# Patient Record
Sex: Male | Born: 1960 | Hispanic: No | Marital: Married | Smoking: Former smoker
Health system: Southern US, Community
[De-identification: ages and names within clinical notes are randomized; demographics above are authoritative.]

## PROBLEM LIST (undated history)

## (undated) ENCOUNTER — Emergency Department (HOSPITAL_COMMUNITY): Admission: EM | Payer: Medicare Other

## (undated) DIAGNOSIS — T7840XA Allergy, unspecified, initial encounter: Secondary | ICD-10-CM

## (undated) DIAGNOSIS — I499 Cardiac arrhythmia, unspecified: Secondary | ICD-10-CM

## (undated) DIAGNOSIS — K5900 Constipation, unspecified: Secondary | ICD-10-CM

## (undated) DIAGNOSIS — M25529 Pain in unspecified elbow: Secondary | ICD-10-CM

## (undated) DIAGNOSIS — K219 Gastro-esophageal reflux disease without esophagitis: Secondary | ICD-10-CM

## (undated) DIAGNOSIS — E785 Hyperlipidemia, unspecified: Secondary | ICD-10-CM

## (undated) DIAGNOSIS — G4730 Sleep apnea, unspecified: Secondary | ICD-10-CM

## (undated) DIAGNOSIS — M545 Low back pain, unspecified: Secondary | ICD-10-CM

## (undated) DIAGNOSIS — G8929 Other chronic pain: Secondary | ICD-10-CM

## (undated) DIAGNOSIS — G5600 Carpal tunnel syndrome, unspecified upper limb: Secondary | ICD-10-CM

## (undated) DIAGNOSIS — I6789 Other cerebrovascular disease: Secondary | ICD-10-CM

## (undated) DIAGNOSIS — L03319 Cellulitis of trunk, unspecified: Secondary | ICD-10-CM

## (undated) DIAGNOSIS — R748 Abnormal levels of other serum enzymes: Secondary | ICD-10-CM

## (undated) DIAGNOSIS — R51 Headache: Secondary | ICD-10-CM

## (undated) DIAGNOSIS — E559 Vitamin D deficiency, unspecified: Secondary | ICD-10-CM

## (undated) DIAGNOSIS — R5381 Other malaise: Secondary | ICD-10-CM

## (undated) DIAGNOSIS — M25569 Pain in unspecified knee: Secondary | ICD-10-CM

## (undated) DIAGNOSIS — G44209 Tension-type headache, unspecified, not intractable: Secondary | ICD-10-CM

## (undated) DIAGNOSIS — R609 Edema, unspecified: Secondary | ICD-10-CM

## (undated) DIAGNOSIS — N400 Enlarged prostate without lower urinary tract symptoms: Secondary | ICD-10-CM

## (undated) DIAGNOSIS — G4700 Insomnia, unspecified: Secondary | ICD-10-CM

## (undated) DIAGNOSIS — J189 Pneumonia, unspecified organism: Secondary | ICD-10-CM

## (undated) DIAGNOSIS — J810 Acute pulmonary edema: Secondary | ICD-10-CM

## (undated) DIAGNOSIS — Z9989 Dependence on other enabling machines and devices: Secondary | ICD-10-CM

## (undated) DIAGNOSIS — R413 Other amnesia: Secondary | ICD-10-CM

## (undated) DIAGNOSIS — D649 Anemia, unspecified: Secondary | ICD-10-CM

## (undated) DIAGNOSIS — I10 Essential (primary) hypertension: Secondary | ICD-10-CM

## (undated) DIAGNOSIS — G4733 Obstructive sleep apnea (adult) (pediatric): Secondary | ICD-10-CM

## (undated) DIAGNOSIS — R519 Headache, unspecified: Secondary | ICD-10-CM

## (undated) DIAGNOSIS — J45909 Unspecified asthma, uncomplicated: Secondary | ICD-10-CM

## (undated) DIAGNOSIS — M109 Gout, unspecified: Secondary | ICD-10-CM

## (undated) DIAGNOSIS — R5383 Other fatigue: Secondary | ICD-10-CM

## (undated) DIAGNOSIS — I959 Hypotension, unspecified: Secondary | ICD-10-CM

## (undated) DIAGNOSIS — L02219 Cutaneous abscess of trunk, unspecified: Secondary | ICD-10-CM

## (undated) DIAGNOSIS — F419 Anxiety disorder, unspecified: Secondary | ICD-10-CM

## (undated) DIAGNOSIS — N2581 Secondary hyperparathyroidism of renal origin: Secondary | ICD-10-CM

## (undated) DIAGNOSIS — B354 Tinea corporis: Secondary | ICD-10-CM

## (undated) DIAGNOSIS — D631 Anemia in chronic kidney disease: Secondary | ICD-10-CM

## (undated) DIAGNOSIS — N186 End stage renal disease: Secondary | ICD-10-CM

## (undated) DIAGNOSIS — K7689 Other specified diseases of liver: Secondary | ICD-10-CM

## (undated) DIAGNOSIS — Z9889 Other specified postprocedural states: Secondary | ICD-10-CM

## (undated) DIAGNOSIS — Z992 Dependence on renal dialysis: Secondary | ICD-10-CM

## (undated) DIAGNOSIS — N529 Male erectile dysfunction, unspecified: Secondary | ICD-10-CM

## (undated) DIAGNOSIS — Z940 Kidney transplant status: Secondary | ICD-10-CM

## (undated) DIAGNOSIS — R112 Nausea with vomiting, unspecified: Secondary | ICD-10-CM

## (undated) DIAGNOSIS — R0602 Shortness of breath: Secondary | ICD-10-CM

## (undated) DIAGNOSIS — K8020 Calculus of gallbladder without cholecystitis without obstruction: Secondary | ICD-10-CM

## (undated) DIAGNOSIS — G609 Hereditary and idiopathic neuropathy, unspecified: Secondary | ICD-10-CM

## (undated) DIAGNOSIS — N039 Chronic nephritic syndrome with unspecified morphologic changes: Secondary | ICD-10-CM

## (undated) DIAGNOSIS — G40209 Localization-related (focal) (partial) symptomatic epilepsy and epileptic syndromes with complex partial seizures, not intractable, without status epilepticus: Secondary | ICD-10-CM

## (undated) HISTORY — DX: Benign prostatic hyperplasia without lower urinary tract symptoms: N40.0

## (undated) HISTORY — PX: BACK SURGERY: SHX140

## (undated) HISTORY — DX: Dependence on renal dialysis: Z99.2

## (undated) HISTORY — DX: Hyperlipidemia, unspecified: E78.5

## (undated) HISTORY — DX: Cutaneous abscess of trunk, unspecified: L03.319

## (undated) HISTORY — DX: Gastro-esophageal reflux disease without esophagitis: K21.9

## (undated) HISTORY — DX: Edema, unspecified: R60.9

## (undated) HISTORY — DX: Headache: R51

## (undated) HISTORY — DX: Abnormal levels of other serum enzymes: R74.8

## (undated) HISTORY — DX: Tinea corporis: B35.4

## (undated) HISTORY — DX: Other specified diseases of liver: K76.89

## (undated) HISTORY — DX: Anemia, unspecified: D64.9

## (undated) HISTORY — DX: Other chronic pain: G89.29

## (undated) HISTORY — DX: Pain in unspecified knee: M25.569

## (undated) HISTORY — DX: Carpal tunnel syndrome, unspecified upper limb: G56.00

## (undated) HISTORY — DX: Other amnesia: R41.3

## (undated) HISTORY — DX: Essential (primary) hypertension: I10

## (undated) HISTORY — DX: Headache, unspecified: R51.9

## (undated) HISTORY — DX: Allergy, unspecified, initial encounter: T78.40XA

## (undated) HISTORY — DX: Other cerebrovascular disease: I67.89

## (undated) HISTORY — DX: Vitamin D deficiency, unspecified: E55.9

## (undated) HISTORY — DX: Gout, unspecified: M10.9

## (undated) HISTORY — DX: Insomnia, unspecified: G47.00

## (undated) HISTORY — DX: Acute pulmonary edema: J81.0

## (undated) HISTORY — DX: Other malaise: R53.81

## (undated) HISTORY — DX: Hereditary and idiopathic neuropathy, unspecified: G60.9

## (undated) HISTORY — PX: AV FISTULA PLACEMENT: SHX1204

## (undated) HISTORY — DX: Shortness of breath: R06.02

## (undated) HISTORY — DX: Anemia in chronic kidney disease: D63.1

## (undated) HISTORY — DX: Localization-related (focal) (partial) symptomatic epilepsy and epileptic syndromes with complex partial seizures, not intractable, without status epilepticus: G40.209

## (undated) HISTORY — DX: Tension-type headache, unspecified, not intractable: G44.209

## (undated) HISTORY — DX: Pain in unspecified elbow: M25.529

## (undated) HISTORY — DX: Anxiety disorder, unspecified: F41.9

## (undated) HISTORY — DX: Calculus of gallbladder without cholecystitis without obstruction: K80.20

## (undated) HISTORY — DX: Hypotension, unspecified: I95.9

## (undated) HISTORY — DX: Other malaise: R53.83

## (undated) HISTORY — DX: Cutaneous abscess of trunk, unspecified: L02.219

## (undated) HISTORY — DX: Sleep apnea, unspecified: G47.30

## (undated) HISTORY — DX: Constipation, unspecified: K59.00

## (undated) HISTORY — DX: Secondary hyperparathyroidism of renal origin: N25.81

## (undated) HISTORY — DX: Low back pain: M54.5

## (undated) HISTORY — DX: Kidney transplant status: Z94.0

## (undated) HISTORY — DX: Male erectile dysfunction, unspecified: N52.9

## (undated) HISTORY — PX: POLYPECTOMY: SHX149

## (undated) HISTORY — DX: Low back pain, unspecified: M54.50

## (undated) HISTORY — PX: COLONOSCOPY: SHX174

## (undated) HISTORY — DX: Anemia in chronic kidney disease: N03.9

## (undated) SURGERY — Surgical Case
Anesthesia: *Unknown

---
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
## 2001-05-10 ENCOUNTER — Emergency Department (HOSPITAL_COMMUNITY): Admission: EM | Admit: 2001-05-10 | Discharge: 2001-05-10 | Payer: Self-pay | Admitting: Emergency Medicine

## 2001-05-10 ENCOUNTER — Encounter: Payer: Self-pay | Admitting: Emergency Medicine

## 2001-08-24 ENCOUNTER — Emergency Department (HOSPITAL_COMMUNITY): Admission: EM | Admit: 2001-08-24 | Discharge: 2001-08-24 | Payer: Self-pay | Admitting: Emergency Medicine

## 2006-12-13 ENCOUNTER — Inpatient Hospital Stay (HOSPITAL_COMMUNITY): Admission: AD | Admit: 2006-12-13 | Discharge: 2006-12-15 | Payer: Self-pay | Admitting: Cardiology

## 2006-12-17 ENCOUNTER — Inpatient Hospital Stay (HOSPITAL_COMMUNITY): Admission: EM | Admit: 2006-12-17 | Discharge: 2006-12-20 | Payer: Self-pay | Admitting: Emergency Medicine

## 2007-01-13 ENCOUNTER — Ambulatory Visit (HOSPITAL_COMMUNITY): Admission: RE | Admit: 2007-01-13 | Discharge: 2007-01-13 | Payer: Self-pay | Admitting: Nephrology

## 2007-01-20 ENCOUNTER — Encounter (INDEPENDENT_AMBULATORY_CARE_PROVIDER_SITE_OTHER): Payer: Self-pay | Admitting: Nephrology

## 2007-01-20 ENCOUNTER — Ambulatory Visit (HOSPITAL_COMMUNITY): Admission: RE | Admit: 2007-01-20 | Discharge: 2007-01-21 | Payer: Self-pay | Admitting: Nephrology

## 2007-02-14 ENCOUNTER — Encounter: Admission: RE | Admit: 2007-02-14 | Discharge: 2007-02-14 | Payer: Self-pay | Admitting: Nephrology

## 2007-05-06 ENCOUNTER — Encounter (HOSPITAL_COMMUNITY): Admission: RE | Admit: 2007-05-06 | Discharge: 2007-08-04 | Payer: Self-pay | Admitting: Nephrology

## 2007-05-20 ENCOUNTER — Ambulatory Visit: Payer: Self-pay | Admitting: Gastroenterology

## 2007-06-02 ENCOUNTER — Ambulatory Visit: Payer: Self-pay | Admitting: Gastroenterology

## 2007-06-02 ENCOUNTER — Encounter: Payer: Self-pay | Admitting: Gastroenterology

## 2007-06-04 ENCOUNTER — Encounter: Payer: Self-pay | Admitting: Gastroenterology

## 2007-08-26 ENCOUNTER — Encounter (HOSPITAL_COMMUNITY): Admission: RE | Admit: 2007-08-26 | Discharge: 2007-11-24 | Payer: Self-pay | Admitting: Nephrology

## 2007-10-28 ENCOUNTER — Ambulatory Visit: Payer: Self-pay | Admitting: Gastroenterology

## 2007-10-28 DIAGNOSIS — K922 Gastrointestinal hemorrhage, unspecified: Secondary | ICD-10-CM | POA: Insufficient documentation

## 2007-11-04 ENCOUNTER — Ambulatory Visit: Payer: Self-pay | Admitting: Gastroenterology

## 2007-11-04 ENCOUNTER — Encounter: Payer: Self-pay | Admitting: Gastroenterology

## 2007-11-06 ENCOUNTER — Encounter: Payer: Self-pay | Admitting: Gastroenterology

## 2007-11-07 ENCOUNTER — Encounter: Payer: Self-pay | Admitting: Gastroenterology

## 2007-12-02 ENCOUNTER — Encounter (HOSPITAL_COMMUNITY): Admission: RE | Admit: 2007-12-02 | Discharge: 2008-01-22 | Payer: Self-pay | Admitting: Nephrology

## 2008-01-23 HISTORY — PX: VASECTOMY: SHX75

## 2008-02-03 ENCOUNTER — Encounter (HOSPITAL_COMMUNITY): Admission: RE | Admit: 2008-02-03 | Discharge: 2008-05-03 | Payer: Self-pay | Admitting: Nephrology

## 2008-05-11 ENCOUNTER — Encounter (HOSPITAL_COMMUNITY): Admission: RE | Admit: 2008-05-11 | Discharge: 2008-08-09 | Payer: Self-pay | Admitting: Nephrology

## 2008-06-09 ENCOUNTER — Ambulatory Visit: Payer: Self-pay | Admitting: Vascular Surgery

## 2008-06-22 ENCOUNTER — Ambulatory Visit (HOSPITAL_COMMUNITY): Admission: RE | Admit: 2008-06-22 | Discharge: 2008-06-22 | Payer: Self-pay | Admitting: Vascular Surgery

## 2008-06-22 ENCOUNTER — Ambulatory Visit: Payer: Self-pay | Admitting: Vascular Surgery

## 2008-07-28 ENCOUNTER — Ambulatory Visit: Payer: Self-pay | Admitting: Vascular Surgery

## 2008-08-24 ENCOUNTER — Encounter (HOSPITAL_COMMUNITY): Admission: RE | Admit: 2008-08-24 | Discharge: 2008-11-22 | Payer: Self-pay | Admitting: Nephrology

## 2008-11-23 ENCOUNTER — Encounter (HOSPITAL_COMMUNITY): Admission: RE | Admit: 2008-11-23 | Discharge: 2009-02-21 | Payer: Self-pay | Admitting: Nephrology

## 2009-02-22 ENCOUNTER — Encounter (HOSPITAL_COMMUNITY): Admission: RE | Admit: 2009-02-22 | Discharge: 2009-05-23 | Payer: Self-pay | Admitting: Nephrology

## 2009-05-31 ENCOUNTER — Encounter (HOSPITAL_COMMUNITY): Admission: RE | Admit: 2009-05-31 | Discharge: 2009-08-29 | Payer: Self-pay | Admitting: Nephrology

## 2009-08-04 ENCOUNTER — Encounter: Payer: Self-pay | Admitting: Family Medicine

## 2009-08-31 ENCOUNTER — Encounter (HOSPITAL_COMMUNITY): Admission: RE | Admit: 2009-08-31 | Discharge: 2009-11-29 | Payer: Self-pay | Admitting: Nephrology

## 2009-09-12 ENCOUNTER — Ambulatory Visit (HOSPITAL_COMMUNITY): Admission: RE | Admit: 2009-09-12 | Discharge: 2009-09-12 | Payer: Self-pay | Admitting: Internal Medicine

## 2009-12-07 ENCOUNTER — Encounter (HOSPITAL_COMMUNITY)
Admission: RE | Admit: 2009-12-07 | Discharge: 2010-02-21 | Payer: Self-pay | Source: Home / Self Care | Attending: Nephrology | Admitting: Nephrology

## 2010-02-05 DIAGNOSIS — Z992 Dependence on renal dialysis: Secondary | ICD-10-CM

## 2010-02-05 HISTORY — DX: Dependence on renal dialysis: Z99.2

## 2010-02-06 LAB — POCT HEMOGLOBIN-HEMACUE: Hemoglobin: 9.4 g/dL — ABNORMAL LOW (ref 13.0–17.0)

## 2010-02-12 ENCOUNTER — Encounter: Payer: Self-pay | Admitting: Internal Medicine

## 2010-02-18 ENCOUNTER — Emergency Department (HOSPITAL_COMMUNITY)
Admission: EM | Admit: 2010-02-18 | Discharge: 2010-02-18 | Payer: Self-pay | Source: Home / Self Care | Admitting: Emergency Medicine

## 2010-02-18 LAB — BASIC METABOLIC PANEL
BUN: 47 mg/dL — ABNORMAL HIGH (ref 6–23)
CO2: 26 mEq/L (ref 19–32)
Calcium: 9 mg/dL (ref 8.4–10.5)
Chloride: 100 mEq/L (ref 96–112)
Creatinine, Ser: 6.33 mg/dL — ABNORMAL HIGH (ref 0.4–1.5)
GFR calc Af Amer: 11 mL/min — ABNORMAL LOW (ref >60)
GFR calc non Af Amer: 9 mL/min — ABNORMAL LOW (ref >60)
Glucose, Bld: 156 mg/dL — ABNORMAL HIGH (ref 70–99)
Potassium: 4 mEq/L (ref 3.5–5.1)
Sodium: 138 mEq/L (ref 135–145)

## 2010-02-18 LAB — URINALYSIS, ROUTINE W REFLEX MICROSCOPIC
Bilirubin Urine: NEGATIVE
Hgb urine dipstick: NEGATIVE
Ketones, ur: NEGATIVE mg/dL
Leukocytes, UA: NEGATIVE
Nitrite: NEGATIVE
Protein, ur: >300 mg/dL — AB
Specific Gravity, Urine: 1.018 (ref 1.005–1.030)
Urine Glucose, Fasting: NEGATIVE mg/dL
Urobilinogen, UA: 0.2 mg/dL (ref 0.0–1.0)
pH: 5.5 (ref 5.0–8.0)

## 2010-02-18 LAB — CBC
HCT: 36 % — ABNORMAL LOW (ref 39.0–52.0)
Hemoglobin: 11.6 g/dL — ABNORMAL LOW (ref 13.0–17.0)
MCH: 31.8 pg (ref 26.0–34.0)
MCHC: 32.2 g/dL (ref 30.0–36.0)
MCV: 98.6 fL (ref 78.0–100.0)
Platelets: 119 10*3/uL — ABNORMAL LOW (ref 150–400)
RBC: 3.65 MIL/uL — ABNORMAL LOW (ref 4.22–5.81)
RDW: 14.3 % (ref 11.5–15.5)
WBC: 5.7 10*3/uL (ref 4.0–10.5)

## 2010-02-18 LAB — DIFFERENTIAL
Basophils Absolute: 0 10*3/uL (ref 0.0–0.1)
Basophils Relative: 1 % (ref 0–1)
Eosinophils Absolute: 0.1 10*3/uL (ref 0.0–0.7)
Eosinophils Relative: 1 % (ref 0–5)
Lymphocytes Relative: 11 % — ABNORMAL LOW (ref 12–46)
Lymphs Abs: 0.6 10*3/uL — ABNORMAL LOW (ref 0.7–4.0)
Monocytes Absolute: 0.5 10*3/uL (ref 0.1–1.0)
Monocytes Relative: 10 % (ref 3–12)
Neutro Abs: 4.4 10*3/uL (ref 1.7–7.7)
Neutrophils Relative %: 78 % — ABNORMAL HIGH (ref 43–77)

## 2010-02-18 LAB — URINE MICROSCOPIC-ADD ON

## 2010-02-20 LAB — URINE CULTURE
Colony Count: NO GROWTH
Culture  Setup Time: 201201290143
Culture: NO GROWTH

## 2010-03-11 ENCOUNTER — Inpatient Hospital Stay (INDEPENDENT_AMBULATORY_CARE_PROVIDER_SITE_OTHER)
Admission: RE | Admit: 2010-03-11 | Discharge: 2010-03-11 | Disposition: A | Discharge status: Home / Self Care | Payer: BC Managed Care – PPO | Source: Ambulatory Visit | Attending: Emergency Medicine | Admitting: Emergency Medicine

## 2010-03-11 DIAGNOSIS — Z992 Dependence on renal dialysis: Secondary | ICD-10-CM

## 2010-03-11 DIAGNOSIS — N186 End stage renal disease: Secondary | ICD-10-CM

## 2010-03-11 DIAGNOSIS — B9789 Other viral agents as the cause of diseases classified elsewhere: Secondary | ICD-10-CM

## 2010-03-11 LAB — POCT I-STAT, CHEM 8
BUN: 58 mg/dL — ABNORMAL HIGH (ref 6–23)
Calcium, Ion: 1.15 mmol/L (ref 1.12–1.32)
Chloride: 103 mEq/L (ref 96–112)
Creatinine, Ser: 7.7 mg/dL — ABNORMAL HIGH (ref 0.4–1.5)
Glucose, Bld: 97 mg/dL (ref 70–99)
HCT: 38 % — ABNORMAL LOW (ref 39.0–52.0)
Hemoglobin: 12.9 g/dL — ABNORMAL LOW (ref 13.0–17.0)
Potassium: 4.4 mEq/L (ref 3.5–5.1)
Sodium: 137 mEq/L (ref 135–145)
TCO2: 26 mmol/L (ref 0–100)

## 2010-03-11 LAB — POCT URINALYSIS DIPSTICK
Ketones, ur: 15 mg/dL — AB
Nitrite: NEGATIVE
Protein, ur: >=300 mg/dL — AB
Specific Gravity, Urine: 1.025 (ref 1.005–1.030)
Urine Glucose, Fasting: NEGATIVE mg/dL
Urobilinogen, UA: 0.2 mg/dL (ref 0.0–1.0)
pH: 5 (ref 5.0–8.0)

## 2010-04-03 LAB — IRON AND TIBC
Iron: 90 ug/dL (ref 42–135)
Saturation Ratios: 40 % (ref 20–55)
TIBC: 225 ug/dL (ref 215–435)
UIBC: 135 ug/dL

## 2010-04-03 LAB — POCT HEMOGLOBIN-HEMACUE
Hemoglobin: 10.9 g/dL — ABNORMAL LOW (ref 13.0–17.0)
Hemoglobin: 9.3 g/dL — ABNORMAL LOW (ref 13.0–17.0)

## 2010-04-03 LAB — PTH, INTACT AND CALCIUM
Calcium, Total (PTH): 7.9 mg/dL — ABNORMAL LOW (ref 8.4–10.5)
PTH: 93.1 pg/mL — ABNORMAL HIGH (ref 14.0–72.0)

## 2010-04-03 LAB — FERRITIN: Ferritin: 318 ng/mL (ref 22–322)

## 2010-04-04 LAB — POCT HEMOGLOBIN-HEMACUE
Hemoglobin: 9.2 g/dL — ABNORMAL LOW (ref 13.0–17.0)
Hemoglobin: 9.3 g/dL — ABNORMAL LOW (ref 13.0–17.0)

## 2010-04-04 LAB — IRON AND TIBC
Iron: 113 ug/dL (ref 42–135)
TIBC: 235 ug/dL (ref 215–435)
UIBC: 122 ug/dL

## 2010-04-04 LAB — FERRITIN: Ferritin: 470 ng/mL — ABNORMAL HIGH (ref 22–322)

## 2010-04-04 LAB — PTH, INTACT AND CALCIUM: PTH: 69.9 pg/mL (ref 14.0–72.0)

## 2010-04-06 LAB — POCT HEMOGLOBIN-HEMACUE: Hemoglobin: 11.7 g/dL — ABNORMAL LOW (ref 13.0–17.0)

## 2010-04-06 LAB — IRON AND TIBC
Saturation Ratios: 72 % — ABNORMAL HIGH (ref 20–55)
UIBC: 63 ug/dL

## 2010-04-06 LAB — PTH, INTACT AND CALCIUM
Calcium, Total (PTH): 8 mg/dL — ABNORMAL LOW (ref 8.4–10.5)
PTH: 80.5 pg/mL — ABNORMAL HIGH (ref 14.0–72.0)

## 2010-04-07 LAB — FERRITIN: Ferritin: 390 ng/mL — ABNORMAL HIGH (ref 22–322)

## 2010-04-07 LAB — PTH, INTACT AND CALCIUM
Calcium, Total (PTH): 9 mg/dL (ref 8.4–10.5)
PTH: 41.8 pg/mL (ref 14.0–72.0)

## 2010-04-08 LAB — POCT HEMOGLOBIN-HEMACUE: Hemoglobin: 10.9 g/dL — ABNORMAL LOW (ref 13.0–17.0)

## 2010-04-09 LAB — IRON AND TIBC
Iron: 68 ug/dL (ref 42–135)
Saturation Ratios: 41 % (ref 20–55)
UIBC: 139 ug/dL

## 2010-04-09 LAB — DIFFERENTIAL
Basophils Absolute: 0.1 10*3/uL (ref 0.0–0.1)
Lymphocytes Relative: 20 % (ref 12–46)
Lymphs Abs: 1 10*3/uL (ref 0.7–4.0)
Monocytes Absolute: 0.4 10*3/uL (ref 0.1–1.0)
Monocytes Relative: 8 % (ref 3–12)
Neutro Abs: 3.5 10*3/uL (ref 1.7–7.7)

## 2010-04-09 LAB — CBC
Hemoglobin: 8.9 g/dL — ABNORMAL LOW (ref 13.0–17.0)
RBC: 2.69 MIL/uL — ABNORMAL LOW (ref 4.22–5.81)
RDW: 13.4 % (ref 11.5–15.5)
WBC: 5.3 10*3/uL (ref 4.0–10.5)

## 2010-04-09 LAB — PTH, INTACT AND CALCIUM: Calcium, Total (PTH): 8.1 mg/dL — ABNORMAL LOW (ref 8.4–10.5)

## 2010-04-09 LAB — POCT HEMOGLOBIN-HEMACUE
Hemoglobin: 9.7 g/dL — ABNORMAL LOW (ref 13.0–17.0)
Hemoglobin: 8.7 g/dL — ABNORMAL LOW (ref 13.0–17.0)

## 2010-04-10 LAB — IRON AND TIBC
Iron: 140 ug/dL — ABNORMAL HIGH (ref 42–135)
Saturation Ratios: 54 % (ref 20–55)
TIBC: 261 ug/dL (ref 215–435)
UIBC: 121 ug/dL

## 2010-04-10 LAB — POCT HEMOGLOBIN-HEMACUE: Hemoglobin: 10.2 g/dL — ABNORMAL LOW (ref 13.0–17.0)

## 2010-04-10 LAB — PTH, INTACT AND CALCIUM: Calcium, Total (PTH): 10.6 mg/dL — ABNORMAL HIGH (ref 8.4–10.5)

## 2010-04-11 LAB — POCT HEMOGLOBIN-HEMACUE: Hemoglobin: 12.1 g/dL — ABNORMAL LOW (ref 13.0–17.0)

## 2010-04-12 LAB — POCT HEMOGLOBIN-HEMACUE
Hemoglobin: 12.6 g/dL — ABNORMAL LOW (ref 13.0–17.0)
Hemoglobin: 9.3 g/dL — ABNORMAL LOW (ref 13.0–17.0)

## 2010-04-12 LAB — FERRITIN: Ferritin: 248 ng/mL (ref 22–322)

## 2010-04-12 LAB — IRON AND TIBC
Iron: 96 ug/dL (ref 42–135)
TIBC: 252 ug/dL (ref 215–435)
UIBC: 156 ug/dL

## 2010-04-16 LAB — RENAL FUNCTION PANEL
Albumin: 3.7 g/dL (ref 3.5–5.2)
BUN: 103 mg/dL — ABNORMAL HIGH (ref 6–23)
CO2: 21 mEq/L (ref 19–32)
GFR calc non Af Amer: 10 mL/min — ABNORMAL LOW (ref >60)
Glucose, Bld: 103 mg/dL — ABNORMAL HIGH (ref 70–99)
Phosphorus: 6.7 mg/dL — ABNORMAL HIGH (ref 2.3–4.6)
Potassium: 5.5 mEq/L — ABNORMAL HIGH (ref 3.5–5.1)
Sodium: 137 mEq/L (ref 135–145)

## 2010-04-16 LAB — IRON AND TIBC
Saturation Ratios: 36 % (ref 20–55)
TIBC: 253 ug/dL (ref 215–435)
UIBC: 163 ug/dL

## 2010-04-16 LAB — PTH, INTACT AND CALCIUM: PTH: 172.1 pg/mL — ABNORMAL HIGH (ref 14.0–72.0)

## 2010-04-26 LAB — POCT HEMOGLOBIN-HEMACUE
Hemoglobin: 11.8 g/dL — ABNORMAL LOW (ref 13.0–17.0)
Hemoglobin: 12.3 g/dL — ABNORMAL LOW (ref 13.0–17.0)

## 2010-04-26 LAB — PTH, INTACT AND CALCIUM
Calcium, Total (PTH): 8.7 mg/dL (ref 8.4–10.5)
PTH: 31.8 pg/mL (ref 14.0–72.0)

## 2010-04-26 LAB — FERRITIN: Ferritin: 176 ng/mL (ref 22–322)

## 2010-04-26 LAB — IRON AND TIBC: UIBC: 163 ug/dL

## 2010-04-27 LAB — CBC
MCHC: 34.3 g/dL (ref 30.0–36.0)
Platelets: 94 10*3/uL — ABNORMAL LOW (ref 150–400)
RDW: 15.3 % (ref 11.5–15.5)

## 2010-04-27 LAB — IRON AND TIBC
Iron: 31 ug/dL — ABNORMAL LOW (ref 42–135)
UIBC: 193 ug/dL

## 2010-04-27 LAB — POCT HEMOGLOBIN-HEMACUE: Hemoglobin: 10.5 g/dL — ABNORMAL LOW (ref 13.0–17.0)

## 2010-04-27 LAB — FERRITIN: Ferritin: 104 ng/mL (ref 22–322)

## 2010-04-28 LAB — POCT HEMOGLOBIN-HEMACUE: Hemoglobin: 10.5 g/dL — ABNORMAL LOW (ref 13.0–17.0)

## 2010-04-29 LAB — PTH, INTACT AND CALCIUM
Calcium, Total (PTH): 8.1 mg/dL — ABNORMAL LOW (ref 8.4–10.5)
PTH: 89 pg/mL — ABNORMAL HIGH (ref 14.0–72.0)

## 2010-04-29 LAB — IRON AND TIBC
Saturation Ratios: 31 % (ref 20–55)
TIBC: 232 ug/dL (ref 215–435)

## 2010-04-30 LAB — IRON AND TIBC: TIBC: 226 ug/dL (ref 215–435)

## 2010-04-30 LAB — PTH, INTACT AND CALCIUM: Calcium, Total (PTH): 8.1 mg/dL — ABNORMAL LOW (ref 8.4–10.5)

## 2010-05-01 LAB — POCT I-STAT 4, (NA,K, GLUC, HGB,HCT)
Glucose, Bld: 108 mg/dL — ABNORMAL HIGH (ref 70–99)
Potassium: 4.2 mEq/L (ref 3.5–5.1)

## 2010-05-01 LAB — POCT HEMOGLOBIN-HEMACUE: Hemoglobin: 10.4 g/dL — ABNORMAL LOW (ref 13.0–17.0)

## 2010-05-03 LAB — PTH, INTACT AND CALCIUM
Calcium, Total (PTH): 8.3 mg/dL — ABNORMAL LOW (ref 8.4–10.5)
PTH: 85.5 pg/mL — ABNORMAL HIGH (ref 14.0–72.0)

## 2010-05-03 LAB — IRON AND TIBC: UIBC: 153 ug/dL

## 2010-05-03 LAB — POCT HEMOGLOBIN-HEMACUE: Hemoglobin: 11.7 g/dL — ABNORMAL LOW (ref 13.0–17.0)

## 2010-05-03 LAB — FERRITIN: Ferritin: 165 ng/mL (ref 22–322)

## 2010-05-04 LAB — PTH, INTACT AND CALCIUM
Calcium, Total (PTH): 7.8 mg/dL — ABNORMAL LOW (ref 8.4–10.5)
PTH: 113.5 pg/mL — ABNORMAL HIGH (ref 14.0–72.0)

## 2010-05-08 LAB — POCT HEMOGLOBIN-HEMACUE: Hemoglobin: 10.6 g/dL — ABNORMAL LOW (ref 13.0–17.0)

## 2010-05-08 LAB — IRON AND TIBC
Iron: 103 ug/dL (ref 42–135)
TIBC: 268 ug/dL (ref 215–435)
TIBC: 268 ug/dL (ref 215–435)
UIBC: 156 ug/dL

## 2010-05-09 LAB — POCT HEMOGLOBIN-HEMACUE: Hemoglobin: 11.1 g/dL — ABNORMAL LOW (ref 13.0–17.0)

## 2010-06-06 NOTE — Assessment & Plan Note (Signed)
OFFICE VISIT   BRANTLY, FREER B  DOB:  29-May-1960                                       07/28/2008  [id number]   The patient is status post placement of a left radiocephalic fistula on  June 1.  He is currently not on dialysis.   On exam today he has an easily palpable thrill in the fistula.  The  incision is well-healed.  He has no numbness in the hand or forearm.  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  The fistula is maturing nicely and should be ready for use if  he requires dialysis in the future.  He will follow up on an as-needed  basis.   Jessy Oto. Fields, MD  Electronically Signed   CEF/MEDQ  D:  07/28/2008  T:  07/29/2008  Job:  [id number]

## 2010-06-06 NOTE — Discharge Summary (Signed)
NAMEDEKWAN, HANSBERGER NO.:  [id number]   MEDICAL RECORD NO.:  [id number]          PATIENT TYPE:  INP   LOCATION:  2009                         FACILITY:  [facility]   PHYSICIAN:  Tery Sanfilippo, MD     DATE OF BIRTH:  April 17, 1960   DATE OF ADMISSION:  12/17/2006  DATE OF DISCHARGE:  12/20/2006                               DISCHARGE SUMMARY   DISCHARGE DIAGNOSES:  1. Altered mental status, resolved, secondary to clonidine most      likely.  2. 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      a.     Further renal consult as an outpatient.  3. Anemia, has been receiving IV iron and EPO as an outpatient.  4. Secondary hyperparathyroidism.  5. Tobacco abuse, recently on Chantix but have now held.   DISCHARGE CONDITION:  Improved/stable.   DISCHARGE MEDICATIONS:  1. Aspirin 81 mg daily.  2. Norvasc 10 mg daily.  3. Lasix 40 take 2 tablets twice a day.  4. Lisinopril 10 mg 1 twice a day.  5. Calcium acetate.  6. PhosLo 667 mg 3 times a day.  7. STOP Bystolic clonidine, transferrin, HCTZ and Chantix.  WITH      BYSTOLIC THE PATIENT HAS BRADYCARDIA.   ACTIVITY:  As tolerated.   DIET:  2 g sodium diet.  The patient was instructed by nutritionist  prior to discharge.   FOLLOWUP:  1. With Dr. Elisabeth Cara.  He has an appointment on the 4th at 10:30.  2. Follow up with Dr. Justin Mend on December 27, 2006, at 11:30.   DISCHARGE INSTRUCTIONS:  Do not stop.   HISTORY OF PRESENT ILLNESS:  A 50 year old male was seen by Dr. Elisabeth Cara  on December 17, 2006, in the office after being discharge December 13, 2006, for hypertensive crisis.  He had increasing shortness of breath,  headaches, orthopnea, unable to lie flat to sleep.  His blood pressure  in the office at that time was 204/104, was admitted.  His creatinine  was 3.66.  Plans were for outpatient renal workup.  He had renal  Dopplers at [facility], renal arterial Dopplers  performed, and he had  right proximal renal artery 1% to 59% diameter reduction, left renal  artery, normal patency, and the right and left kidneys essentially  normal in size, symmetrical in shape with normal cortex and medulla.  No  abnormal echogenicity or hydronephrosis visualized.  He also has a 2D  echocardiogram with abnormal LV function and diastolic dysfunction but  normal RV function.  He was then admitted, blood pressure was controlled  and he was discharged on a high dose of clonidine 0.3 mg twice a day,  Bystolic 10 daily and Norvasc 10 daily.  He was also on  hydrochlorothiazide at that time as well.  He was reseen as stated on  December 17, 2006, with altered mental status.  He was lethargic, tired,  nauseated, dry mouth and difficulty staying awake.  He was again sent to  [facility] for further evaluation.  Blood pressure was  well  controlled at 132/84.  Plans for renal consult in the hospital.  It was  felt that this was secondary to clonidine intoxication.   Family history, social history, review of systems see H&P.   PHYSICAL EXAMINATION:  At discharge:  VITAL SIGNS:  Blood pressure 124/73, pulse 51, respirations 20, temp  97.2,  oxygen saturation 2 liters 91%.  HEART:  S1 and S2.  Regular rate and rhythm.  LUNGS:  Clear.  ABDOMEN:  Positive bowel sounds.  EXTREMITIES: No edema.   IMAGING:  Renal ultrasound no hydronephrosis.  Kidneys are diffusely  echogenic. This was done, ordered by the renal service.   LABORATORY DATA:  Admitting hemoglobin 10.9, hematocrit 32.5, WBC 6.7,  platelets 205, MCV 91.7 and on day prior to discharge hemoglobin 9.8,  hematocrit 28.6, WBC 8, platelets 164, MCV 91.7.   Chemistry on admission potassium 5.3, sodium 138, chloride 108, CO2 21,  BUN 75, creatinine 3.91, glucose 101, and at discharge sodium 137,  potassium 4.3, chloride 107, CO2 22, BUN 60, creatinine 3.85 and glucose  126.  Coags pro time 12.7, INR 0.9, PTT 27, that was  back on the 21st.  LFTs were normal but his albumin was low initially 2.1 and at day prior  to discharge 1.9.  Cardiac markers on admission CK ranged was 120, MB  1.8, troponin I 0.01, negative.  Electrolytes on the 25th, calcium was  7.3 and at discharge calcium 7.6, phosphorus 7.2.  UA on the 27th  nitrites negative, leukocytes negative, bacteria rare, WBC 0 to 2, RBC  11 to 20, protein greater than 300.  Other laboratory values TSH 2.466.   Total protein 24 hours, urine volume was 4225, total protein 24-hour  urine was 8577, creatinine on the 24-hour urine 49.6, creatinine 24 was  2096.  Hepatitis B surface antigen was negative.  Iron was 37, TIBC 210,  iron sat 18, UIBC 173.  SPE with interpretation pending.  Parathyroid  hormone was pending, and hepatitis C antibody was pending.  Renal  ultrasound as stated.   Renal consult was obtained. Dr. Jimmy Footman saw the patient.  Feels as  stated in the discharge plus he has stage IV chronic renal disease.  The  patient's blood pressure maintained here in the hospital.   HOSPITAL COURSE:  The patient was admitted by Dr. Elisabeth Cara.  Renal  consult was obtained.  Clonidine was discontinued.  The patient was  given Aranesp here in the hospital as well as IV iron due to his low  iron.  He had further renal testing done due to elevated protein.  Dr.  Jimmy Footman recommended decreasing the Bystolic and increasing the ACE  inhibitor which were done prior to discharge.  The patient will continue  on these meds.  In  addition he was bradycardic on his Bystolic with heart rates down to 47  at night.  His only complaint was some mild dizziness when he first  stood up.  The patient was seen both by Dr. Elisabeth Cara and Dr. Jimmy Footman.  He underwent nutritional counseling prior to discharge as well for 2 g  sodium diet.  He will follow up as an outpatient next week.      Otilio Carpen. Ingold, N.P.      Tery Sanfilippo, MD  Electronically Signed    LRI/MEDQ   D:  12/20/2006  T:  12/21/2006  Job:  [id number]   cc:   Justin Mend, MD  Sundra Aland, MD

## 2010-06-06 NOTE — Procedures (Signed)
CEPHALIC VEIN MAPPING   INDICATION:  Preop evaluation.   HISTORY:  Stage 4 kidney disease.   EXAM:   The right cephalic vein is compressible.   Diameter measurements range from 0.3 to 0.49 cm.   The left cephalic vein is compressible.   Diameter measurements range from 0.31 to 0.49 cm.   See attached worksheet for all measurements.   IMPRESSION:  Patent bilateral cephalic veins with diameter measurements  are described above and on the attached work sheet.   ___________________________________________  Jessy Oto. Fields, MD   CH/MEDQ  D:  06/09/2008  T:  06/09/2008  Job:  [id number]

## 2010-06-06 NOTE — Discharge Summary (Signed)
Kirk [name], NABORS NO.:  [id number]   MEDICAL RECORD NO.:  [id number]          PATIENT TYPE:  INP   LOCATION:  [id number]                         FACILITY:  [facility]   PHYSICIAN:  Tery Sanfilippo, MD     DATE OF BIRTH:  October 17, 1960   DATE OF ADMISSION:  12/13/2006  DATE OF DISCHARGE:  12/15/2006                               DISCHARGE SUMMARY   DISCHARGE DIAGNOSES:  1. Hypertensive urgency - resolved.  2. Diastolic congestive heart failure.  3. Renal failure - needs outpatient urology evaluation as soon as      possible.  4. Chronic obstructive pulmonary disease with ongoing smoking on      Chantix.  5. Obesity.   This is a 50 year old gentleman patient of Dr. Elisabeth Cara who came to our  office for evaluation of his shortness of breath.  He has been having  these symptoms for the last six months, but gradually they had gotten  worse and accompanied by headaches and experienced few episodes of  orthopnea during the week prior to his office visit.  In the office, his  blood pressure was elevated to 204/104 and patient was admitted to the  hospital with hypertensive urgency.  On admission, he was started on  Norvasc, Bystolic and Catapres and sent to the hospital for blood  pressure stabilization.   We cycled his cardiac enzymes and there were no abnormalities in them.   Dr. Einar Gip assessed patient the next morning and his blood pressures  still remained elevated although somewhat improved; it was 179/99.  The  dose of Bystolic was increased as well as dose of Catapres.   On the day of discharge, Dr. Einar Gip reassessed the patient.  His blood  pressure was 148/79.  Patient did not have any significant complaints of  chest pain or shortness of breath.  The plan was to discharge him home  on current medications and follow up with Dr. Elisabeth Cara as outpatient.   HOSPITAL LABORATORIES:  Phosphorous 5.6, high.  Magnesium 2.8, high.  Cardiac panel negative x1.  C-Met:  Sodium 136,  potassium 3.9, chloride  110, CO2 18, BUN 72, creatinine 3.79.  Liver function tests:  SGOT 15,  SGPT 21, total protein 2.6, calcium 7.1.  CBC showed white blood cell  count 6.9, hematocrit 28.6, hemoglobin 9.8, platelet count 170,  hemoglobin A1c 5.4. TSH 2.771.   DISCHARGE MEDICATIONS:  1. Chantix 1 mg b.i.d.  2. Norvasc 10 mg q.d.  3. Triamterene/HCTZ 37.5/25 mg q.d.  4. Aspirin 81 mg q.d.  5. Fish oil 1000 mg b.i.d.  6. Clonidine 0.3 mg b.i.d.  7. Bystolic 10 mg q.d.   DISCHARGE FOLLOWUP:  Patient will be seen by Dr. Elisabeth Cara in ten days in  our office.      [name], P.A.      Tery Sanfilippo, MD  Electronically Signed    MK/MEDQ  D:  12/15/2006  T:  12/15/2006  Job:  [id number]   cc:   [place]

## 2010-06-06 NOTE — Assessment & Plan Note (Signed)
OFFICE VISIT   Kirk [name], Kirk [name]  DOB:  [birth year]                                       06/09/2008  [id number]   The patient is a 50 year old male referred by Dr. Jimmy Footman for  placement of long-term hemodialysis access.  Most recent creatinine was  5.16.  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   PAST MEDICAL HISTORY:  Remarkable for hypertension, secondary  hyperparathyroidism, anemia, hyperlipidemia and smoking.   MEDICATIONS:  1. Furosemide 40 mg twice a day.  2. Calcium acetate 660 mg three times a day.  3. Amlodipine once a day.  4. Hectorol 10 mcg one every other day.  5. Nuvigil 250 mg once a day.  6. Sodium bicarbonate 2 tablets twice a day.  7. Lisinopril 80 mg 2 tablets at bedtime.  8. Zocor 80 mg 2 tablets at bedtime.  9. Multivitamin.   ALLERGIES:  He has a side effect to codeine which causes nausea but  states that he can take Percocet.   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  Currently smokes 4-5 cigarettes per day.  He does not  consume alcohol regularly.   REVIEW OF SYSTEMS:  He is 5 feet 7 inches, 204 pounds.  ORTHO:  He has multiple arthritis and joint pain.  ENT:  He has had some decline in his eyesight recently.  Otherwise, hematologic, psychiatric, neurologic, GI, pulmonary, cardiac  review of systems are all negative.   PHYSICAL EXAM:  Blood pressure is 137/81 in the left arm, pulse is 58  and regular.  Extremities:  He has 2+ brachial and radial pulses  bilaterally.  On placement of a tourniquet on the left arm there is a  visible left forearm cephalic vein.   He had a vein mapping ultrasound today which shows the vein diameter in  the forearm on the left side between 0.4 and 0.49 mm in diameter.  On  the right side between 0.33 and 0.57 mm in diameter.  The cephalic vein  in the upper  arm also was of good quality.   I discussed with the patient today different forms of hemodialysis  access and I believe he would be best suited for a left radiocephalic AV  fistula.  We have scheduled this for June 1 as he had some conflict with  this next week.  He also needs to have his Procrit shot and some lab  work done for Dr. Jimmy Footman on that day and will try to arrange all of  this for the same day for him.  Risks, benefits, possible complications  and procedure details were explained to the patient and his wife today.  These were including but not limited to bleeding, infection,  nonmaturation of the fistula, numbness and tingling on the dorsum of the  hand.  He understands and agrees to proceed.  He was also given a note  today for excuse from work from 06/22/2008 through 06/25/2008.   Jessy Oto. Fields, MD  Electronically Signed   CEF/MEDQ  D:  06/09/2008  T:  06/10/2008  Job:  [id number]   cc:   Jeneen Rinks L. Deterding, M.D.

## 2010-06-06 NOTE — Consult Note (Signed)
NAME:  Kirk [name], Kirk [name]               ACCOUNT NO.:  [id number]   MEDICAL RECORD NO.:  [id number]          PATIENT TYPE:  INP   LOCATION:  2009                         FACILITY:  [facility]   PHYSICIAN:  James L. Deterding, M.D.DATE OF BIRTH:  1944-10-02   DATE OF CONSULTATION:  DATE OF DISCHARGE:                                 CONSULTATION   REASON FOR CONSULTATION:  1. Severe hypertension.  2. Chronic kidney disease.   HISTORY OF PRESENT ILLNESS:  This is a 50 year old gentleman with recent  diagnosis of severe high blood pressure.  He presented on December 13, 2006 with hypertensive urgency.  He has not measured his blood pressure  reliably for years.  He was admitted on December 13, 2006, and on  December 15, 2006 his blood pressure was brought down.  Creatinine was  in the high 3s at that time.  Nothing was done  particularly.  He was  set up for outpatient evaluation.  He returned December 17, 2006 with  sleepiness, confusion, thought to be related to clonidine, which he was  on at that time.  He has history of headaches occurring very frequently,  almost daily over the last few days.  He took aspirin for them.  He has  had recurrent ankle edema, PND, 2-pillow orthopnea, dyspnea on exertion.  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   He notes ankle edema, which has been very significant for him.  He has  no history of stones, urinary tract infection, hematuria, or flank pain.  He has had no history of renal injury.   ALLERGIES:  CODEINE.  Question if allergy to CODEINE, was mostly GI  upset, was more an intolerance.   CURRENT MEDICATIONS:  Bystolic, clonidine, amlodipine, and now Maxzide.  He is currently now off clonidine.   PAST MEDICAL HISTORY:  Listed above.  Multiple ear operations.   REVIEW OF SYSTEMS:  HEENT:   No headaches.  He recently started wearing  glasses.  No soreness, dry eyes or dry mouth.  He has around a 60 to 20  pack/year history of smoking, and just quit in the last couple of days.  He coughs up some phlegm at the current time.  GI:  He has some  constipation at the current time, no indigestion or heartburn.  SKIN:  without abnormalities.  MUSCULOSKELETAL:  No history of specific  complaints.  NEUROLOGIC:  Pleasant.  When he came in he was sleepy and  lethargic.  His wife thought that his mentation was fuzzy.  CARDIOVASCULAR:  As listed above.   SOCIAL HISTORY:  He lives with his wife.  He does not drink.  Smoking  history as above.  He was adopted as a child.  Works at [employer].   PHYSICAL EXAMINATION:  GENERAL:  No acute distress.  He is somewhat hard  of hearing.  VITAL SIGNS:  Blood pressure 146/76 supine, going to 144/76 standing.  Temperature is 97.7.  HEENT:  Arterial narrowing, silver wiring, some exudates.  NECK:  Without masses or thyromegaly.  LUNGS:  Reveal normal percussion, normal expansion, normal breath  sounds.  Occasional rhonchi.  CARDIOVASCULAR:  Regular rhythm.  Grade 2/6 holosystolic murmur heard  best at the apex.  PMI is 11 cm at the left intercostal space, left  ventricular lift, hyperventricular, 1+ to 2+ edema.  Pulses 2+/4+.  No  bruits are noted.  ABDOMEN:  Active bowel sounds.  Soft.  The liver is down 2 cm.  No  abnormalities.  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  No thyromegaly.  No significant adenopathy in the  axilla.  No supraclavicular, cervical adenopathy.  NEUROLOGIC:  Cranial nerves II-XII are grossly intact, reactive in all  movements.  Motor 5/5 and symmetric.  Deep tendon reflexes 2+/4+, toes  downgoing.   LABORATORY DATA:  Sodium 134, potassium 4.6, chloride 107, bicarbonate  21, BUN 21, creatinine 3.9, BUN 73, glucose 139, albumin 2.1.  Urinalysis on December 13, 2006:  300 mg protein, 7-10 red cells.   ASSESSMENT:  1. Chronic kidney disease stage 4.  Hypertension clearly has a part,      but questionable if he has a primary glomerulonephritis (GN), most      likely chronic.  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  Needs evaluation.  He is anemic at      this time and is acidemic at this time as complications, as well as      hypertensive.  Will check a PTH also.  2. Hypertension, primary and secondary to his renal disease.  He needs      a loop diuretic-ACE inhibitor to help reserve his renal function as      well as suppress his proteinuria and control his blood pressure.  3. Anemia.  Will check iron studies at this time.  Will need EPO.  4. Secondary hyperparathyroidism.   PLAN:  1. Lasix.  2. Discontinue the IV fluids he is getting now.  3. Ultrasound.  4. UPEP.  5. SPEP.  6. A 24-hour urine.  7. Iron, TIBC.  8. EPO.           ______________________________  Joyice Faster. Deterding, M.D.     JLD/MEDQ  D:  12/18/2006  T:  12/19/2006  Job:  [id number]

## 2010-06-06 NOTE — Assessment & Plan Note (Signed)
[facility] OFFICE NOTE   Kirk [name], Kirk [name]                        MRN:          [id number]  DATE:05/20/2007                            DOB:          10-29-1960    REFERRING PHYSICIAN:  Jeneen Rinks L. Deterding, M.D.   PRIMARY CARE PHYSICIAN:  Robyn Haber, M.D.   REASON FOR REFERRAL:  Dr. Jimmy Footman asked me to evaluate Kirk [name] in  consultation regarding intermittent rectal bleeding.   HISTORY OF PRESENT ILLNESS:  Kirk [name] is a pleasant 50 year old man  with chronic kidney disease due to childhood kidney illness, who had  several months of intermittent bright red blood per rectum.  He says  this happens when he moves his bowels. He has no rectal pain or anal  pain when he moves his bowels.  He is not aware that he has hemorrhoids.  He does have to intermittently strain to move his bowels, although this  is not very common for him.  He was originally set up by Dr. Joseph Art  to have a colonoscopy in 2008 with another physician in [place] but  that had to be cancelled because the patient was hospitalized.   REVIEW OF SYSTEMS:  Notable for stable weight and otherwise essentially  normal.   VITAL SIGNS:  See nursing intake sheet.   PAST MEDICAL HISTORY:  1. Chronic kidney disease, stage IV; Dr. Jimmy Footman follows him      closely.  2. Chronic anemia, on IV iron and Epogen as an outpatient.  3. Secondary hyperparathyroidism.  4. Elevated cholesterol.  5. Chronic headaches.  6. Hypertension.   CURRENT MEDICATIONS:  1. Amlodipine.  2. Calcium.  3. Lasix.  4. Lisinopril.  5. Simvastatin.  6. Vitamin C.  7. Fish oil.  [place].  10.Vitamin B2.  11.Baby aspirin.  12.Garlic tablets.   ALLERGIES:  CODEINE.   SOCIAL HISTORY:  Married, one daughter.  Works as a Aeronautical engineer at [employer].  Smokes 5 to 6 cigarettes a day.  Does not drink alcohol.   FAMILY HISTORY:  He is  adopted.   PHYSICAL EXAMINATION:  VITAL SIGNS:  5 feet, 8 inches, weight 198  pounds.  Blood pressure 102/60, pulse 68.  CONSTITUTIONAL:  Generally well-appearing.  NEUROLOGICAL:  Alert and oriented x3.  HEENT:  Eyes:  Extraocular movements intact.  Mouth: Oropharynx moist,  no lesions.  NECK:  Supple, no lymphadenopathy.  CARDIOVASCULAR:  Heart has regular rate and rhythm.  LUNGS:  Clear to auscultation bilaterally.  ABDOMEN:  Soft, nontender, nondistended, normal bowel sounds.  EXTREMITIES:  No lower extremity edema.  SKIN:  No rashes or lesions on visible extremities.   ASSESSMENT/PLAN:  A 50 year old man with mild intermittent rectal  bleeding in the setting of chronic kidney disease (creatinine usually in  the upper 3's).   This seems anorectal in origin, likely hemorrhoidal.  I will arrange for  him to have a full colonoscopy performed at his soonest convenience.  I  see no reason for any further blood tests or imaging studies prior to  then.  Milus Banister, MD  Electronically Signed    DPJ/MedQ  DD: 05/20/2007  DT: 05/20/2007  Job #: [id number]   cc:   Robyn Haber, M.D.  James L. Deterding, M.D.

## 2010-06-06 NOTE — Op Note (Signed)
NAME:  [name], [name]               ACCOUNT NO.:  [id number]   MEDICAL RECORD NO.:  [id number]          PATIENT TYPE:  AMB   LOCATION:  SDS                          FACILITY:  [facility]   PHYSICIAN:  Charles E. Fields, MD  DATE OF BIRTH:  08-Dec-1960   DATE OF PROCEDURE:  06/22/2008  DATE OF DISCHARGE:                               OPERATIVE REPORT   PROCEDURE:  Left radiocephalic arteriovenous fistula.   PREOPERATIVE DIAGNOSIS:  End-stage renal disease.   POSTOPERATIVE DIAGNOSIS:  End-stage renal disease.   ANESTHESIA:  General.   ASSISTANT:  Schofield Cellar, RNFA   OPERATIVE FINDINGS:  1. A 3-mm left cephalic vein.  2. A 2.5-mm left radial artery with spasm.   OPERATIVE DETAILS:  After obtaining informed consent, the patient was  taken to the operating room.  The patient was placed in supine position  on the operating table.  After induction of general anesthesia, the  patient's entire left upper extremity was prepped and draped in usual  sterile fashion.  Local anesthesia was infiltrated midway between the  cephalic vein and radial artery.  Longitudinal incision made in this  location, carried down through subcutaneous tissues down to level of the  cephalic vein.  Cephalic vein was dissected free circumferentially.  Small side branches were ligated and divided between silk ties.  Radial  artery was then dissected free in the medial portion of the incision.  Radial artery had some spasm and this was approximately 2 to 2.5 mm  artery.  This was dissected free circumferentially and vessel loops  placed proximal and distal to the planned site of arteriotomy.  The  patient was then given 5000 units of intravenous heparin.  Distal  cephalic vein was ligated with 2-0 silk ties.  The vein was then  transected and swung over to the level of the artery.  The vein was  gently distended with heparinized saline and then clamped proximally  with a fine bulldog clamp.  It was marked for  orientation.  Vessel loops  were used to control the radial artery proximally and distally.  A  longitudinal opening was made in the radial artery and the vein was then  sewn end of vein to side of artery using running 7-0 Prolene suture.  Just prior to completion of anastomosis, it was forebled backbled, and  thoroughly flushed.  Anastomosis was secured, clamps were released,  there was  palpable thrill in the proximal fistula immediately.  Next  hemostasis was obtained.  Subcutaneous tissues were reapproximated using  running 3-0 Vicryl suture.  Skin was closed with 4-0 Vicryl subcuticular  stitch.  The patient tolerated the procedure well and there were no  complications.  Instrument, sponge, and needle counts was correct at the  end of the case.  The patient was taken to recovery room in stable  condition.     Jessy Oto. Fields, MD  Electronically Signed    CEF/MEDQ  D:  06/22/2008  T:  06/22/2008  Job:  [id number]

## 2010-06-15 ENCOUNTER — Other Ambulatory Visit: Payer: Self-pay | Admitting: Internal Medicine

## 2010-06-15 DIAGNOSIS — R531 Weakness: Secondary | ICD-10-CM

## 2010-06-15 DIAGNOSIS — G459 Transient cerebral ischemic attack, unspecified: Secondary | ICD-10-CM

## 2010-06-15 DIAGNOSIS — R55 Syncope and collapse: Secondary | ICD-10-CM

## 2010-06-22 ENCOUNTER — Ambulatory Visit
Admission: RE | Admit: 2010-06-22 | Discharge: 2010-06-22 | Disposition: A | Discharge status: Home / Self Care | Payer: BC Managed Care – PPO | Source: Ambulatory Visit | Attending: Internal Medicine | Admitting: Internal Medicine

## 2010-06-22 DIAGNOSIS — R531 Weakness: Secondary | ICD-10-CM

## 2010-06-22 DIAGNOSIS — R55 Syncope and collapse: Secondary | ICD-10-CM

## 2010-06-22 DIAGNOSIS — G459 Transient cerebral ischemic attack, unspecified: Secondary | ICD-10-CM

## 2010-07-20 ENCOUNTER — Encounter: Payer: Self-pay | Admitting: Gastroenterology

## 2010-08-16 ENCOUNTER — Emergency Department (HOSPITAL_COMMUNITY)
Admission: EM | Admit: 2010-08-16 | Discharge: 2010-08-17 | Disposition: A | Discharge status: Home / Self Care | Payer: BC Managed Care – PPO | Attending: Emergency Medicine | Admitting: Emergency Medicine

## 2010-08-16 DIAGNOSIS — J4489 Other specified chronic obstructive pulmonary disease: Secondary | ICD-10-CM | POA: Insufficient documentation

## 2010-08-16 DIAGNOSIS — M25569 Pain in unspecified knee: Secondary | ICD-10-CM | POA: Insufficient documentation

## 2010-08-16 DIAGNOSIS — B20 Human immunodeficiency virus [HIV] disease: Secondary | ICD-10-CM | POA: Insufficient documentation

## 2010-08-16 DIAGNOSIS — J449 Chronic obstructive pulmonary disease, unspecified: Secondary | ICD-10-CM | POA: Insufficient documentation

## 2010-08-16 DIAGNOSIS — E7800 Pure hypercholesterolemia, unspecified: Secondary | ICD-10-CM | POA: Insufficient documentation

## 2010-08-16 DIAGNOSIS — R42 Dizziness and giddiness: Secondary | ICD-10-CM | POA: Insufficient documentation

## 2010-08-16 DIAGNOSIS — H9209 Otalgia, unspecified ear: Secondary | ICD-10-CM | POA: Insufficient documentation

## 2010-08-16 DIAGNOSIS — N186 End stage renal disease: Secondary | ICD-10-CM | POA: Insufficient documentation

## 2010-08-16 DIAGNOSIS — H6120 Impacted cerumen, unspecified ear: Secondary | ICD-10-CM | POA: Insufficient documentation

## 2010-08-16 DIAGNOSIS — I120 Hypertensive chronic kidney disease with stage 5 chronic kidney disease or end stage renal disease: Secondary | ICD-10-CM | POA: Insufficient documentation

## 2010-08-16 DIAGNOSIS — Z992 Dependence on renal dialysis: Secondary | ICD-10-CM | POA: Insufficient documentation

## 2010-08-16 DIAGNOSIS — N2581 Secondary hyperparathyroidism of renal origin: Secondary | ICD-10-CM | POA: Insufficient documentation

## 2010-08-16 DIAGNOSIS — I951 Orthostatic hypotension: Secondary | ICD-10-CM | POA: Insufficient documentation

## 2010-08-16 LAB — CBC
HCT: 33.9 % — ABNORMAL LOW (ref 39.0–52.0)
Hemoglobin: 11.8 g/dL — ABNORMAL LOW (ref 13.0–17.0)
MCH: 32.7 pg (ref 26.0–34.0)
MCHC: 34.8 g/dL (ref 30.0–36.0)
MCV: 93.9 fL (ref 78.0–100.0)
Platelets: 129 10*3/uL — ABNORMAL LOW (ref 150–400)
RBC: 3.61 MIL/uL — ABNORMAL LOW (ref 4.22–5.81)
RDW: 12.4 % (ref 11.5–15.5)
WBC: 7.6 K/uL (ref 4.0–10.5)

## 2010-08-17 LAB — BASIC METABOLIC PANEL
CO2: 30 mEq/L (ref 19–32)
Calcium: 10.7 mg/dL — ABNORMAL HIGH (ref 8.4–10.5)
Creatinine, Ser: 4.82 mg/dL — ABNORMAL HIGH (ref 0.50–1.35)
GFR calc Af Amer: 16 mL/min — ABNORMAL LOW (ref >60)
GFR calc non Af Amer: 13 mL/min — ABNORMAL LOW (ref >60)
Sodium: 136 mEq/L (ref 135–145)

## 2010-08-17 LAB — BASIC METABOLIC PANEL WITH GFR
BUN: 16 mg/dL (ref 6–23)
Chloride: 92 meq/L — ABNORMAL LOW (ref 96–112)
Glucose, Bld: 207 mg/dL — ABNORMAL HIGH (ref 70–99)
Potassium: 3.1 meq/L — ABNORMAL LOW (ref 3.5–5.1)

## 2010-08-17 LAB — TROPONIN I: Troponin I: <0.3 ng/mL (ref <0.30)

## 2010-08-18 LAB — GLUCOSE, CAPILLARY: Glucose-Capillary: 150 mg/dL — ABNORMAL HIGH (ref 70–99)

## 2010-10-11 ENCOUNTER — Other Ambulatory Visit: Payer: Self-pay | Admitting: Internal Medicine

## 2010-10-11 DIAGNOSIS — M25561 Pain in right knee: Secondary | ICD-10-CM

## 2010-10-11 DIAGNOSIS — M25521 Pain in right elbow: Secondary | ICD-10-CM

## 2010-10-16 ENCOUNTER — Ambulatory Visit
Admission: RE | Admit: 2010-10-16 | Discharge: 2010-10-16 | Disposition: A | Discharge status: Home / Self Care | Payer: BC Managed Care – PPO | Source: Ambulatory Visit | Attending: Internal Medicine | Admitting: Internal Medicine

## 2010-10-16 DIAGNOSIS — M25561 Pain in right knee: Secondary | ICD-10-CM

## 2010-10-16 DIAGNOSIS — M25521 Pain in right elbow: Secondary | ICD-10-CM

## 2010-10-18 LAB — CBC
Hemoglobin: 11.9 — ABNORMAL LOW
MCHC: 34.5
Platelets: 121 — ABNORMAL LOW
RDW: 12.8

## 2010-10-18 LAB — IRON AND TIBC
Iron: 44
Saturation Ratios: 16 — ABNORMAL LOW
TIBC: 273

## 2010-10-19 LAB — CBC
HCT: 35.3 — ABNORMAL LOW
MCHC: 33.9
MCV: 92.7
Platelets: 93 — ABNORMAL LOW
RDW: 12.6

## 2010-10-19 LAB — IRON AND TIBC
Iron: 65
UIBC: 186

## 2010-10-20 LAB — POCT I-STAT 4, (NA,K, GLUC, HGB,HCT)
Glucose, Bld: 113 — ABNORMAL HIGH
HCT: 36 — ABNORMAL LOW
Potassium: 4.5

## 2010-10-20 LAB — IRON AND TIBC
Saturation Ratios: 31
TIBC: 251
UIBC: 174

## 2010-10-20 LAB — FERRITIN: Ferritin: 90 (ref 22–322)

## 2010-10-23 LAB — POCT HEMOGLOBIN-HEMACUE
Hemoglobin: 10.9 — ABNORMAL LOW
Hemoglobin: 11.4 — ABNORMAL LOW

## 2010-10-23 LAB — IRON AND TIBC
Iron: 63
UIBC: 165

## 2010-10-24 LAB — IRON AND TIBC: UIBC: 164 ug/dL

## 2010-10-25 LAB — IRON AND TIBC
Iron: 99
Saturation Ratios: 35
UIBC: 180

## 2010-10-26 ENCOUNTER — Other Ambulatory Visit: Payer: Self-pay | Admitting: Internal Medicine

## 2010-10-26 ENCOUNTER — Ambulatory Visit
Admission: RE | Admit: 2010-10-26 | Discharge: 2010-10-26 | Disposition: A | Discharge status: Home / Self Care | Payer: BC Managed Care – PPO | Source: Ambulatory Visit | Attending: Internal Medicine | Admitting: Internal Medicine

## 2010-10-26 DIAGNOSIS — R05 Cough: Secondary | ICD-10-CM

## 2010-10-26 DIAGNOSIS — R509 Fever, unspecified: Secondary | ICD-10-CM

## 2010-10-27 LAB — IRON AND TIBC
Iron: 83 ug/dL (ref 42–135)
Saturation Ratios: 35 % (ref 20–55)
TIBC: 233 ug/dL (ref 215–435)
TIBC: 236 ug/dL (ref 215–435)
UIBC: 153 ug/dL

## 2010-10-27 LAB — CBC
HCT: 29.3 — ABNORMAL LOW
HCT: 32.4 — ABNORMAL LOW
Hemoglobin: 10.1 — ABNORMAL LOW
Hemoglobin: 11.1 — ABNORMAL LOW
MCHC: 34.2
MCHC: 34.4
MCHC: 35
MCV: 89.7
MCV: 91.2
RBC: 3.28 — ABNORMAL LOW
RDW: 12.3
RDW: 12.6
RDW: 12.7

## 2010-10-27 LAB — DIFFERENTIAL
Basophils Absolute: 0.1
Basophils Relative: 1
Eosinophils Relative: 3
Monocytes Absolute: 0.4

## 2010-10-27 LAB — CROSSMATCH: ABO/RH(D): A POS

## 2010-10-27 LAB — ABO/RH: ABO/RH(D): A POS

## 2010-10-27 LAB — BLEEDING TIME
Bleeding Time: 12 — ABNORMAL HIGH
Bleeding Time: 9

## 2010-10-27 LAB — FERRITIN: Ferritin: 154 ng/mL (ref 22–322)

## 2010-10-31 LAB — COMPREHENSIVE METABOLIC PANEL
ALT: 17
AST: 15
AST: 16
AST: 17
Albumin: 1.9 — ABNORMAL LOW
Albumin: 2.1 — ABNORMAL LOW
BUN: 72 — ABNORMAL HIGH
BUN: 75 — ABNORMAL HIGH
CO2: 18 — ABNORMAL LOW
CO2: 22
Calcium: 7.1 — ABNORMAL LOW
Calcium: 7.3 — ABNORMAL LOW
Chloride: 110
Chloride: 112
Creatinine, Ser: 3.79 — ABNORMAL HIGH
Creatinine, Ser: 3.92 — ABNORMAL HIGH
Creatinine, Ser: 3.94 — ABNORMAL HIGH
GFR calc Af Amer: 20 — ABNORMAL LOW
GFR calc Af Amer: 20 — ABNORMAL LOW
GFR calc Af Amer: 21 — ABNORMAL LOW
GFR calc non Af Amer: 17 — ABNORMAL LOW
GFR calc non Af Amer: 17 — ABNORMAL LOW
GFR calc non Af Amer: 17 — ABNORMAL LOW
Potassium: 4.7
Sodium: 139
Total Bilirubin: 0.5
Total Bilirubin: 0.8

## 2010-10-31 LAB — IRON AND TIBC
Iron: 37 — ABNORMAL LOW
UIBC: 173

## 2010-10-31 LAB — DIFFERENTIAL
Basophils Absolute: 0
Basophils Absolute: 0.1
Eosinophils Relative: 1
Lymphocytes Relative: 26
Lymphocytes Relative: 36
Lymphs Abs: 1.8
Lymphs Abs: 2.4
Monocytes Absolute: 0.6
Monocytes Relative: 8
Neutro Abs: 3.6
Neutro Abs: 4.2

## 2010-10-31 LAB — BASIC METABOLIC PANEL
BUN: 73 — ABNORMAL HIGH
CO2: 20
CO2: 22
Calcium: 7.1 — ABNORMAL LOW
Calcium: 7.3 — ABNORMAL LOW
Chloride: 107
Chloride: 107
Chloride: 113 — ABNORMAL HIGH
Creatinine, Ser: 3.7 — ABNORMAL HIGH
Creatinine, Ser: 3.71 — ABNORMAL HIGH
GFR calc Af Amer: 19 — ABNORMAL LOW
GFR calc Af Amer: 22 — ABNORMAL LOW
GFR calc Af Amer: 22 — ABNORMAL LOW
GFR calc non Af Amer: 17 — ABNORMAL LOW
Glucose, Bld: 139 — ABNORMAL HIGH
Glucose, Bld: 98
Potassium: 4.6
Potassium: 5.2 — ABNORMAL HIGH
Sodium: 134 — ABNORMAL LOW
Sodium: 134 — ABNORMAL LOW
Sodium: 137

## 2010-10-31 LAB — LIPID PANEL
Cholesterol: 219 — ABNORMAL HIGH
HDL: 36 — ABNORMAL LOW
LDL Cholesterol: 159 — ABNORMAL HIGH
Total CHOL/HDL Ratio: 6.1
Triglycerides: 121
VLDL: 24

## 2010-10-31 LAB — CBC
HCT: 29.2 — ABNORMAL LOW
HCT: 32.5 — ABNORMAL LOW
Hemoglobin: 10.9 — ABNORMAL LOW
Hemoglobin: 9.7 — ABNORMAL LOW
Hemoglobin: 9.8 — ABNORMAL LOW
MCHC: 33.7
MCHC: 33.8
MCHC: 34
MCHC: 34.2
MCV: 90.8
MCV: 91.4
MCV: 91.7
MCV: 92.1
Platelets: 154
Platelets: 158
Platelets: 164
RBC: 3.12 — ABNORMAL LOW
RBC: 3.12 — ABNORMAL LOW
RBC: 3.16 — ABNORMAL LOW
RBC: 3.17 — ABNORMAL LOW
RBC: 3.54 — ABNORMAL LOW
RDW: 12.8
WBC: 6.9
WBC: 6.9
WBC: 8

## 2010-10-31 LAB — PROTEIN, URINE, 24 HOUR
Protein, 24H Urine: 8577 — ABNORMAL HIGH
Urine Total Volume-UPROT: 4225

## 2010-10-31 LAB — RENAL FUNCTION PANEL
CO2: 22
Calcium: 7.6 — ABNORMAL LOW
GFR calc Af Amer: 21 — ABNORMAL LOW
GFR calc non Af Amer: 17 — ABNORMAL LOW
Phosphorus: 7.2 — ABNORMAL HIGH
Potassium: 4.3
Sodium: 137

## 2010-10-31 LAB — URINALYSIS, ROUTINE W REFLEX MICROSCOPIC
Bilirubin Urine: NEGATIVE
Bilirubin Urine: NEGATIVE
Glucose, UA: NEGATIVE
Glucose, UA: NEGATIVE
Ketones, ur: NEGATIVE
Nitrite: NEGATIVE
Protein, ur: >300 — AB
Specific Gravity, Urine: 1.015
Urobilinogen, UA: 0.2
pH: 5.5

## 2010-10-31 LAB — URINE MICROSCOPIC-ADD ON

## 2010-10-31 LAB — URINE CULTURE
Colony Count: NO GROWTH
Special Requests: NEGATIVE

## 2010-10-31 LAB — CARDIAC PANEL(CRET KIN+CKTOT+MB+TROPI)
Total CK: 144
Troponin I: 0.02

## 2010-10-31 LAB — HEPATITIS C ANTIBODY: HCV Ab: NEGATIVE

## 2010-10-31 LAB — PROTIME-INR: Prothrombin Time: 12.7

## 2010-10-31 LAB — HEPATITIS B SURFACE ANTIGEN: Hepatitis B Surface Ag: NEGATIVE

## 2010-10-31 LAB — MISCELLANEOUS TEST

## 2010-10-31 LAB — HEMOGLOBIN A1C
Hgb A1c MFr Bld: 5.4
Mean Plasma Glucose: 115

## 2010-10-31 LAB — PROTEIN ELECTROPH W RFLX QUANT IMMUNOGLOBULINS
Alpha-1-Globulin: 5.4 — ABNORMAL HIGH
Beta 2: 6.3
Gamma Globulin: 15.1
M-Spike, %: NOT DETECTED

## 2010-10-31 LAB — PTH, INTACT AND CALCIUM
Calcium, Total (PTH): 6.8 — ABNORMAL LOW
PTH: 128.1 — ABNORMAL HIGH

## 2010-10-31 LAB — CK TOTAL AND CKMB (NOT AT ARMC): CK, MB: 1.8

## 2010-10-31 LAB — C4 COMPLEMENT: Complement C4, Body Fluid: 21

## 2010-10-31 LAB — C3 COMPLEMENT: C3 Complement: 91

## 2010-10-31 LAB — PHOSPHORUS: Phosphorus: 5.6 — ABNORMAL HIGH

## 2010-10-31 LAB — MAGNESIUM: Magnesium: 2.8 — ABNORMAL HIGH

## 2010-10-31 LAB — CREATININE, URINE, 24 HOUR
Collection Interval-UCRE24: 24
Urine Total Volume-UCRE24: 4225

## 2010-10-31 LAB — TSH: TSH: 2.466

## 2010-10-31 LAB — ANA: Anti Nuclear Antibody(ANA): NEGATIVE

## 2011-03-12 ENCOUNTER — Other Ambulatory Visit: Payer: Self-pay | Admitting: Internal Medicine

## 2011-03-13 ENCOUNTER — Encounter (HOSPITAL_COMMUNITY): Payer: Self-pay | Admitting: Respiratory Therapy

## 2011-03-21 ENCOUNTER — Encounter (HOSPITAL_COMMUNITY)
Admission: RE | Disposition: A | Discharge status: Home / Self Care | Payer: Self-pay | Source: Ambulatory Visit | Attending: Internal Medicine

## 2011-03-21 ENCOUNTER — Ambulatory Visit (HOSPITAL_COMMUNITY)
Admission: RE | Admit: 2011-03-21 | Discharge: 2011-03-21 | Disposition: A | Discharge status: Home / Self Care | Payer: BC Managed Care – PPO | Source: Ambulatory Visit | Attending: Internal Medicine | Admitting: Internal Medicine

## 2011-03-21 ENCOUNTER — Encounter (HOSPITAL_COMMUNITY): Payer: Self-pay | Admitting: Internal Medicine

## 2011-03-21 DIAGNOSIS — R079 Chest pain, unspecified: Secondary | ICD-10-CM | POA: Insufficient documentation

## 2011-03-21 DIAGNOSIS — Q898 Other specified congenital malformations: Secondary | ICD-10-CM | POA: Insufficient documentation

## 2011-03-21 DIAGNOSIS — N186 End stage renal disease: Secondary | ICD-10-CM | POA: Insufficient documentation

## 2011-03-21 DIAGNOSIS — Z992 Dependence on renal dialysis: Secondary | ICD-10-CM | POA: Insufficient documentation

## 2011-03-21 HISTORY — PX: CARDIAC CATHETERIZATION: SHX172

## 2011-03-21 HISTORY — PX: LEFT HEART CATHETERIZATION WITH CORONARY ANGIOGRAM: SHX5451

## 2011-03-21 SURGERY — LEFT HEART CATHETERIZATION WITH CORONARY ANGIOGRAM
Anesthesia: LOCAL

## 2011-03-21 MED ORDER — MIDAZOLAM HCL 2 MG/2ML IJ SOLN
INTRAMUSCULAR | Status: AC
  Filled 2011-03-21: qty 2

## 2011-03-21 MED ORDER — NITROGLYCERIN 0.2 MG/ML ON CALL CATH LAB
INTRAVENOUS | Status: AC
  Filled 2011-03-21: qty 1

## 2011-03-21 MED ORDER — SODIUM CHLORIDE 0.9 % IV SOLN: 1.0000 mL/kg/h | INTRAVENOUS | Status: AC

## 2011-03-21 MED ORDER — SODIUM CHLORIDE 0.9 % IJ SOLN: 3.0000 mL | Freq: Two times a day (BID) | INTRAMUSCULAR | Status: DC

## 2011-03-21 MED ORDER — ACETAMINOPHEN 325 MG PO TABS: 650.0000 mg | ORAL_TABLET | ORAL | Status: DC | PRN

## 2011-03-21 MED ORDER — FENTANYL CITRATE 0.05 MG/ML IJ SOLN
INTRAMUSCULAR | Status: AC
  Filled 2011-03-21: qty 2

## 2011-03-21 MED ORDER — SODIUM CHLORIDE 0.9 % IV SOLN
INTRAVENOUS | Status: DC
  Administered 2011-03-21: 75 mL/h via INTRAVENOUS

## 2011-03-21 MED ORDER — HEPARIN (PORCINE) IN NACL 2-0.9 UNIT/ML-% IJ SOLN
INTRAMUSCULAR | Status: AC
  Filled 2011-03-21: qty 2000

## 2011-03-21 MED ORDER — ONDANSETRON HCL 4 MG/2ML IJ SOLN: 4.0000 mg | Freq: Four times a day (QID) | INTRAMUSCULAR | Status: DC | PRN

## 2011-03-21 MED ORDER — LIDOCAINE HCL (PF) 1 % IJ SOLN
INTRAMUSCULAR | Status: AC
  Filled 2011-03-21: qty 30

## 2011-03-21 NOTE — H&P (Signed)
[facility]          INTERVAL PROCEDURE H&P   History and Physical Interval Note:  03/21/2011 10:00 AM  Kirk [name] has presented today for their planned procedure. The various methods of treatment have been discussed with the patient and family. After consideration of risks, benefits and other options for treatment, the patient has consented to the procedure.  The patients' outpatient history has been reviewed, patient examined, and no change in status from most recent office note within the past 30 days. I have reviewed the patients' chart and labs and will proceed as planned. Questions were answered to the patient's satisfaction.   [name], MD, [facility] Attending Cardiologist [facility] [place] [facility] 03/21/2011, 10:00 AM

## 2011-03-21 NOTE — Op Note (Signed)
Kirk [name]     CARDIAC CATHETERIZATION REPORT  Kirk [name]   [id number] 10/08/1960  Performing Cardiologist: [name] Primary Physician: Estill Dooms, MD, MD Primary Cardiologist: Dr. Debara Pickett  Procedures Performed:  Left Heart Catheterization via 5 Fr left common femoral artery access  Left Ventriculography, (RAO/LAO) 15 ml/sec for 30 ml total contrast  Native Coronary Angiography   Indication(s): chest pain  History: 51 y.o. 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 He has been experiencing chest pain during dialysis and had a stress test last year at [facility] which was reportedly negative for ischemia. This was part of a transplant work-up which he is undergoing. He recently has been having chest pain at dialysis. Given his numerous risk factors for CAD and need for a definitive study prior to transplant, he was referred for cardiac catheterization.  Consent: The procedure with Risks/Benefits/Alternatives and Indications was reviewed with the patient and family.  All questions were answered.    Risks / Complications include, but not limited to: Death, MI, CVA/TIA, VF/VT (with defibrillation), Bradycardia (need for temporary pacer placement), contrast induced nephropathy, bleeding / bruising / hematoma / pseudoaneurysm, vascular or coronary injury (with possible emergent CT or Vascular Surgery), adverse medication reactions, infection.    The patient and family voice understanding and agree to proceed.    Risks of procedure as well as the alternatives and risks of each were explained to the (patient/caregiver).  Consent for procedure obtained. Consent for signed by MD and patient with RN witness -- placed on chart.  Procedure: The patient was brought to the [facility] in the fasting state and prepped and draped in the usual sterile fashion for (Right groin) access.  Sterile technique was  used including antiseptics, cap, gloves, gown, hand hygiene, mask and sheet.  Skin prep: Chlorhexidine;  Time Out: Verified patient identification, verified procedure, site/side was marked, verified correct patient position, special equipment/implants available, medications/allergies/relevent history reviewed, required imaging and test results available.  Performed  @DHRADCATH @ @DHGROINCATH @ @DHGRAFTANGIO @  The patient was transported to the cath lab holding area in stable condition.   The patient  was stable before, during and following the procedure.   Patient did tolerate procedure well. There were not complications. EBL: <10 cc  Medications:  Sedation:  2 mg IV Versed, 50 IV mcg Fentanyl  Contrast:  85 cc Omnipaque   Hemodynamics:  Central Aortic Pressure / Mean Aortic Pressure: 150/83  LV Pressure / LV End diastolic Pressure:  21  Left Ventriculography:  EF:  65%  Wall Motion: Normal wall motion, no MR  Coronary Angiographic Data:  No significant obstructive coronary artery disease. Right dominant system. Large caliber vessels.  Impression: 1.  No signfiicant obstructive CAD. 2.  Chest pain may be chest wall cramping at dialysis, related to hypotension, or non-cardiac cause.   Plan: 1. Discharge home today. Follow-up with me in the office.  The case and results was discussed with the patient and family. The case and results was not discussed with the patient's PCP. The case and results was discussed with the patient's Cardiologist.  Time Spend Directly with Patient:  62 minutes  [name], MD, [facility] Attending Cardiologist [facility] [place] [facility] 03/21/2011, 10:46 AM

## 2011-05-08 ENCOUNTER — Encounter: Payer: Self-pay | Admitting: Gastroenterology

## 2011-06-01 ENCOUNTER — Ambulatory Visit (INDEPENDENT_AMBULATORY_CARE_PROVIDER_SITE_OTHER): Payer: BC Managed Care – PPO | Admitting: Gastroenterology

## 2011-06-01 ENCOUNTER — Encounter: Payer: Self-pay | Admitting: Gastroenterology

## 2011-06-01 VITALS — BP 162/64 | HR 88 | Ht 67.5 in | Wt 230.4 lb

## 2011-06-01 DIAGNOSIS — F17200 Nicotine dependence, unspecified, uncomplicated: Secondary | ICD-10-CM

## 2011-06-01 DIAGNOSIS — R112 Nausea with vomiting, unspecified: Secondary | ICD-10-CM

## 2011-06-01 DIAGNOSIS — D369 Benign neoplasm, unspecified site: Secondary | ICD-10-CM | POA: Insufficient documentation

## 2011-06-01 MED ORDER — OMEPRAZOLE 20 MG PO CPDR: 20.0000 mg | DELAYED_RELEASE_CAPSULE | Freq: Every day | ORAL | Status: DC

## 2011-06-01 MED ORDER — MOVIPREP 100 G PO SOLR: 1.0000 | ORAL | Status: DC

## 2011-06-01 NOTE — Patient Instructions (Addendum)
One of your biggest health concerns is your smoking.  This increases your risk for most cancers and serious cardiovascular diseases such as strokes, heart attacks.  You should try your best to stop.  If you need assistance, please contact your PCP or Smoking Cessation Class at [employer] [phone]) or [place] (1-800-QUIT-NOW). You have been given a separate informational sheet regarding your tobacco use, the importance of quitting and local resources to help you quit. You will be set up for a colonoscopy in [place] for personal history of polyps. You will be set up for an upper endoscopy for nausea, vomiting.  If that is not helpful, then abdominal ultrasound. Samples of PPI (antiacid medicine) given, take one pil once a day 20-30 min before breakfast meal.

## 2011-06-01 NOTE — Progress Notes (Signed)
Review of pertinent gastrointestinal problems: 1. Personal history of tubular adenomas, colonoscopy May 2009. Recommended to have repeat colonoscopy at 3 year interval.  A reminder letter was sent in 2012. 2. new anemia prompted EGD October 2009. Mild gastritis, duodenitis H. pylori positive on biopsy.   HPI: This is a   very pleasant 51 year old man whom I last saw 2-3 years ago. He is here for a new complaint with is wife and daughter  Has been nauseas, vomits.  Intermittent up to 2-3 times a weeks.  Not clearly related to HD.  Was related to dose increase of depakote, eventually this was stopped completely 2-3 months ago.    NO pain with the vomiting.  Weight is up over time.  He is not taking anything for nausea. No dysphagia.  No overt bleeding.  Someone told him he may have chronic pancreatitis from the Depakote that he was started on in January, actually the dose was just increased in January and his nausea began shortly afterward. Eventually the Depakote was completely stopped. I do see that Depakote can cause nausea. Also has a potential complication of pancreatitis. He has been told that he might have pancreatitis, chronic from the Depakote which I think would be highly unlikely.  Labs dated January 2013 show hemoglobin 11, platelets 119. Normal liver tests, creatinine 7 has chronic kidney failure.  Does not take nsaids  Very rare pyrosis, on no meds.  Gastric emptying scan August 2011 ordered by his primary care physician was normal  #2 side effect of Pravachol is nausea, vomiting. #2 side effect of tramadol is nausea   Review of systems: Pertinent positive and negative review of systems were noted in the above HPI section. Complete review of systems was performed and was otherwise normal.    Past Medical History  Diagnosis Date  . Anxiety   . Chronic headaches   . Chronic kidney disease (CKD)   . Dialysis care 02/06/2010  . HTN (hypertension)   . Sleep apnea     Past  Surgical History  Procedure Date  . Inner ear surgery     for deafness at age 25    Current Outpatient Prescriptions  Medication Sig Dispense Refill  . amLODipine (NORVASC) 5 MG tablet Take 5 mg by mouth daily.      Marland Kitchen aspirin 81 MG tablet Take 81 mg by mouth daily.      . calcium acetate (PHOSLO) 667 MG capsule Take 2,001 mg by mouth 3 (three) times daily with meals. Take 1 capsule with snacks      . doxazosin (CARDURA) 2 MG tablet Take 2 mg by mouth at bedtime.      . multivitamin (RENA-VIT) TABS tablet Take 1 tablet by mouth daily.      . pravastatin (PRAVACHOL) 20 MG tablet Take 20 mg by mouth daily.      . traMADol (ULTRAM) 50 MG tablet Take 100 mg by mouth every 6 (six) hours as needed. For pain      . vitamin C (ASCORBIC ACID) 500 MG tablet Take 500 mg by mouth daily.      Marland Kitchen zolpidem (AMBIEN) 5 MG tablet Take 5 mg by mouth at bedtime.        Allergies as of 06/01/2011 - Review Complete 06/01/2011  Allergen Reaction Noted  . Codeine  05/20/2007    History reviewed. No pertinent family history.  History   Social History  . Marital Status: Married    Spouse Name: N/A  Number of Children: 3  . Years of Education: N/A   Occupational History  . disabled    Social History Main Topics  . Smoking status: Current Everyday Smoker    Types: Cigarettes  . Smokeless tobacco: Never Used  . Alcohol Use: No  . Drug Use: No  . Sexually Active: Not on file   Other Topics Concern  . Not on file   Social History Narrative  . No narrative on file       Physical Exam: There were no vitals taken for this visit. Constitutional: generally well-appearing Psychiatric: alert and oriented x3 Eyes: extraocular movements intact Mouth: oral pharynx moist, no lesions Neck: supple no lymphadenopathy Cardiovascular: heart regular rate and rhythm Lungs: clear to auscultation bilaterally Abdomen: soft, nontender, nondistended, no obvious ascites, no peritoneal signs, normal bowel  sounds Extremities: no lower extremity edema bilaterally Skin: no lesions on visible extremities    Assessment and plan: 51 y.o. male with nausea, vomiting. History of adenomatous colon polyps, history of H. pylori infection  I think we should proceed with EGD for his nausea, vomiting. He has a history of H. pylori infection and perhaps that his back. He does not take NSAIDs. He is on 2 medicines that can cause nausea as somewhat common side effects that being tramadol and Pravachol. I doubt he has chronic pancreatitis from Depakote. He did receive my reminder letter for a colonoscopy about a year ago and is now willing to undergo surveillance colonoscopy. We'll do that at the same time as his upper endoscopy. Perhaps some of his nausea is related to GERD, acid. I am giving him samples of proton pump inhibitor which she will start once daily.

## 2011-06-27 ENCOUNTER — Encounter: Payer: Self-pay | Admitting: Gastroenterology

## 2011-06-27 ENCOUNTER — Ambulatory Visit (AMBULATORY_SURGERY_CENTER): Payer: BC Managed Care – PPO | Admitting: Gastroenterology

## 2011-06-27 VITALS — BP 156/89 | HR 72 | Temp 99.0°F | Resp 17 | Ht 67.5 in | Wt 230.0 lb

## 2011-06-27 DIAGNOSIS — K2990 Gastroduodenitis, unspecified, without bleeding: Secondary | ICD-10-CM

## 2011-06-27 DIAGNOSIS — D369 Benign neoplasm, unspecified site: Secondary | ICD-10-CM

## 2011-06-27 DIAGNOSIS — D126 Benign neoplasm of colon, unspecified: Secondary | ICD-10-CM

## 2011-06-27 DIAGNOSIS — Z86010 Personal history of colonic polyps: Secondary | ICD-10-CM

## 2011-06-27 DIAGNOSIS — R112 Nausea with vomiting, unspecified: Secondary | ICD-10-CM

## 2011-06-27 DIAGNOSIS — K2970 Gastritis, unspecified, without bleeding: Secondary | ICD-10-CM

## 2011-06-27 MED ORDER — SODIUM CHLORIDE 0.9 % IV SOLN: 500.0000 mL | INTRAVENOUS | Status: DC

## 2011-06-27 NOTE — Progress Notes (Signed)
Pt does dialysis Tuesday, Thursday and Saturday

## 2011-06-27 NOTE — Progress Notes (Signed)
Patient did not experience any of the following events: a burn prior to discharge; a fall within the facility; wrong site/side/patient/procedure/implant event; or a hospital transfer or hospital admission upon discharge from the facility. (G8907) Patient did not have preoperative order for IV antibiotic SSI prophylaxis. (G8918)  

## 2011-06-27 NOTE — Op Note (Signed)
[facility] [address]. [place], [place]  [id number]  COLONOSCOPY PROCEDURE REPORT  PATIENT:  Kirk [name], Kirk [name]  MR#:  [id number] BIRTHDATE:  Apr 28, 1960, 51 yrs. old  GENDER:  male ENDOSCOPIST:  Milus Banister, MD PROCEDURE DATE:  06/27/2011 PROCEDURE:  Colonoscopy with snare polypectomy ASA CLASS:  Class II INDICATIONS:  Personal history of tubular adenomas, colonoscopy May 2009. Recommended to have repeat colonoscopy at 3 year interval.  A reminder letter was sent in 2012. MEDICATIONS:   Fentanyl 75 mcg IV, These medications were titrated to patient response per physician's verbal order, Versed 8 mg IV  DESCRIPTION OF PROCEDURE:   After the risks benefits and alternatives of the procedure were thoroughly explained, informed consent was obtained.  Digital rectal exam was performed and revealed no rectal masses.   The LB CF-[id number] endoscope was introduced through the anus and advanced to the cecum, which was identified by both the appendix and ileocecal valve, without limitations.  The quality of the prep was good..  The instrument was then slowly withdrawn as the colon was fully examined. <<PROCEDUREIMAGES>> FINDINGS:  Two heaped up polyps, 6-25mm across each were removed with snare/cautery from ascending colon. Both were retrieved and sent to pathology (jar 1) (see image4, image5, and image6).  This was otherwise a normal examination of the colon (see image8, image1, and image2).   Retroflexed views in the rectum revealed no abnormalities. COMPLICATIONS:  None  ENDOSCOPIC IMPRESSION: 1) Polyps, multiple 2) Otherwise normal examination  RECOMMENDATIONS: 1) Given your personal history of adenomatous (pre-cancerous) polyps, you will need a repeat colonoscopy in 5 years even if the polyps removed today are NOT precancerous. 2) You will receive a letter within 1-2 weeks with the results of your biopsy as well as final recommendations. Please call my office if  you have not received a letter after 3 weeks.  ______________________________ Milus Banister, MD  n. eSIGNED:   Milus Banister at 06/27/2011 02:40 PM  Elnita Maxwell, [id number]

## 2011-06-27 NOTE — Patient Instructions (Addendum)
One of your biggest health concerns is your smoking.  This increases your risk for most cancers and serious cardiovascular diseases such as strokes, heart attacks.  You should try your best to stop.  If you need assistance, please contact your PCP or Smoking Cessation Class at [facility] [phone]) or [place] (1-800-QUIT-NOW). YOU HAD AN ENDOSCOPIC PROCEDURE TODAY AT [facility]: Refer to the procedure report that was given to you for any specific questions about what was found during the examination.  If the procedure report does not answer your questions, please call your gastroenterologist to clarify.  If you requested that your care partner not be given the details of your procedure findings, then the procedure report has been included in a sealed envelope for you to review at your convenience later.  YOU SHOULD EXPECT: Some feelings of bloating in the abdomen. Passage of more gas than usual.  Walking can help get rid of the air that was put into your GI tract during the procedure and reduce the bloating. If you had a lower endoscopy (such as a colonoscopy or flexible sigmoidoscopy) you may notice spotting of blood in your stool or on the toilet paper. If you underwent a bowel prep for your procedure, then you may not have a normal bowel movement for a few days.  DIET: Your first meal following the procedure should be a light meal and then it is ok to progress to your normal diet.  A half-sandwich or bowl of soup is an example of a good first meal.  Heavy or fried foods are harder to digest and may make you feel nauseous or bloated.  Likewise meals heavy in dairy and vegetables can cause extra gas to form and this can also increase the bloating.  Drink plenty of fluids but you should avoid alcoholic beverages for 24 hours.  ACTIVITY: Your care partner should take you home directly after the procedure.  You should plan to take it easy, moving slowly for the rest of the  day.  You can resume normal activity the day after the procedure however you should NOT DRIVE or use heavy machinery for 24 hours (because of the sedation medicines used during the test).    SYMPTOMS TO REPORT IMMEDIATELY: A gastroenterologist can be reached at any hour.  During normal business hours, 8:30 AM to 5:00 PM Monday through Friday, call [phone].  After hours and on weekends, please call the GI answering service at [phone] who will take a message and have the physician on call contact you.   Following lower endoscopy (colonoscopy or flexible sigmoidoscopy):  Excessive amounts of blood in the stool  Significant tenderness or worsening of abdominal pains  Swelling of the abdomen that is new, acute  Fever of 100F or higher  Following upper endoscopy (EGD)  Vomiting of blood or coffee ground material  New chest pain or pain under the shoulder blades  Painful or persistently difficult swallowing  New shortness of breath  Fever of 100F or higher  Black, tarry-looking stools  FOLLOW UP: If any biopsies were taken you will be contacted by phone or by letter within the next 1-3 weeks.  Call your gastroenterologist if you have not heard about the biopsies in 3 weeks.  Our staff will call the home number listed on your records the next business day following your procedure to check on you and address any questions or concerns that you may have at that time regarding the information  given to you following your procedure. This is a courtesy call and so if there is no answer at the home number and we have not heard from you through the emergency physician on call, we will assume that you have returned to your regular daily activities without incident.  SIGNATURES/CONFIDENTIALITY: You and/or your care partner have signed paperwork which will be entered into your electronic medical record.  These signatures attest to the fact that that the information above on your After Visit  Summary has been reviewed and is understood.  Full responsibility of the confidentiality of this discharge information lies with you and/or your care-partner.   Resume medications.Information given on polyps and gastritis with discharge information.

## 2011-06-27 NOTE — Op Note (Signed)
[facility] [address]. [place], [place]  [id number]  ENDOSCOPY PROCEDURE REPORT  PATIENT:  Kirk [name], Kirk [name]  MR#:  [id number] BIRTHDATE:  [birth year], 51 yrs. old  GENDER:  male ENDOSCOPIST:  Milus Banister, MD PROCEDURE DATE:  06/27/2011 PROCEDURE:  EGD with biopsy, [address] ASA CLASS:  Class II INDICATIONS:  nausea, history of H. pylori + gastritis MEDICATIONS:  Fentanyl 25 mcg IV, There was residual sedation effect present from prior procedure., These medications were titrated to patient response per physician's verbal order, Versed 2 mg IV TOPICAL ANESTHETIC:  Cetacaine Spray  DESCRIPTION OF PROCEDURE:   After the risks benefits and alternatives of the procedure were thoroughly explained, informed consent was obtained.  The LB GIF-H180 [id number] endoscope was introduced through the mouth and advanced to the second portion of the duodenum, without limitations.  The instrument was slowly withdrawn as the mucosa was fully examined. <<PROCEDUREIMAGES>> There was moderate, non-specific gastritis and duodenitis. Biopsies taken from stomach and sent to pathology (jar 1) (see image4 and image6).  Otherwise the examination was normal (see image8, image7, and image2).    Retroflexed views revealed no abnormalities.    The scope was then withdrawn from the patient and the procedure completed. COMPLICATIONS:  None  ENDOSCOPIC IMPRESSION: 1) Moderate gastritis and duodenitis.  Biopsies taken to check for H. pylori. 2) Otherwise normal examination  RECOMMENDATIONS: If biopsies show H. pylori, you will be started on appropriate antibiotics.  ______________________________ Milus Banister, MD  n. eSIGNED:   Milus Banister at 06/27/2011 02:51 PM  Elnita Maxwell, [id number]

## 2011-06-28 ENCOUNTER — Telehealth: Payer: Self-pay | Admitting: *Deleted

## 2011-06-28 NOTE — Telephone Encounter (Signed)
  Follow up Call-  Call back number 06/27/2011  Post procedure Call Back phone  # [phone]  Permission to leave phone message Yes     Patient questions:  Do you have a fever, pain , or abdominal swelling? no Pain Score  0 *  Have you tolerated food without any problems? yes  Have you been able to return to your normal activities? yes  Do you have any questions about your discharge instructions: Diet   no Medications  no Follow up visit  no  Do you have questions or concerns about your Care? no  Actions: * If pain score is 4 or above: No action needed, pain <4.

## 2011-07-03 ENCOUNTER — Encounter: Payer: Self-pay | Admitting: Gastroenterology

## 2011-08-04 ENCOUNTER — Emergency Department (HOSPITAL_COMMUNITY): Payer: BC Managed Care – PPO

## 2011-08-04 ENCOUNTER — Encounter (HOSPITAL_COMMUNITY): Payer: Self-pay | Admitting: Emergency Medicine

## 2011-08-04 ENCOUNTER — Inpatient Hospital Stay (HOSPITAL_COMMUNITY): Payer: BC Managed Care – PPO

## 2011-08-04 ENCOUNTER — Inpatient Hospital Stay (HOSPITAL_COMMUNITY)
Admission: EM | Admit: 2011-08-04 | Discharge: 2011-08-09 | DRG: 087 | Disposition: A | Discharge status: Home / Self Care | Payer: BC Managed Care – PPO | Attending: Emergency Medicine | Admitting: Emergency Medicine

## 2011-08-04 DIAGNOSIS — K922 Gastrointestinal hemorrhage, unspecified: Secondary | ICD-10-CM

## 2011-08-04 DIAGNOSIS — Z992 Dependence on renal dialysis: Secondary | ICD-10-CM

## 2011-08-04 DIAGNOSIS — I120 Hypertensive chronic kidney disease with stage 5 chronic kidney disease or end stage renal disease: Secondary | ICD-10-CM | POA: Diagnosis present

## 2011-08-04 DIAGNOSIS — Z9989 Dependence on other enabling machines and devices: Secondary | ICD-10-CM | POA: Diagnosis present

## 2011-08-04 DIAGNOSIS — J9600 Acute respiratory failure, unspecified whether with hypoxia or hypercapnia: Principal | ICD-10-CM | POA: Diagnosis present

## 2011-08-04 DIAGNOSIS — D369 Benign neoplasm, unspecified site: Secondary | ICD-10-CM

## 2011-08-04 DIAGNOSIS — F411 Generalized anxiety disorder: Secondary | ICD-10-CM | POA: Diagnosis present

## 2011-08-04 DIAGNOSIS — J811 Chronic pulmonary edema: Secondary | ICD-10-CM

## 2011-08-04 DIAGNOSIS — K529 Noninfective gastroenteritis and colitis, unspecified: Secondary | ICD-10-CM | POA: Diagnosis present

## 2011-08-04 DIAGNOSIS — G4733 Obstructive sleep apnea (adult) (pediatric): Secondary | ICD-10-CM | POA: Diagnosis present

## 2011-08-04 DIAGNOSIS — K5289 Other specified noninfective gastroenteritis and colitis: Secondary | ICD-10-CM | POA: Diagnosis present

## 2011-08-04 DIAGNOSIS — Z79899 Other long term (current) drug therapy: Secondary | ICD-10-CM

## 2011-08-04 DIAGNOSIS — J441 Chronic obstructive pulmonary disease with (acute) exacerbation: Secondary | ICD-10-CM | POA: Diagnosis present

## 2011-08-04 DIAGNOSIS — N19 Unspecified kidney failure: Secondary | ICD-10-CM

## 2011-08-04 DIAGNOSIS — F17200 Nicotine dependence, unspecified, uncomplicated: Secondary | ICD-10-CM | POA: Diagnosis present

## 2011-08-04 DIAGNOSIS — D638 Anemia in other chronic diseases classified elsewhere: Secondary | ICD-10-CM | POA: Diagnosis present

## 2011-08-04 DIAGNOSIS — N186 End stage renal disease: Secondary | ICD-10-CM

## 2011-08-04 DIAGNOSIS — J45901 Unspecified asthma with (acute) exacerbation: Secondary | ICD-10-CM | POA: Diagnosis present

## 2011-08-04 DIAGNOSIS — Z7982 Long term (current) use of aspirin: Secondary | ICD-10-CM

## 2011-08-04 DIAGNOSIS — M948X9 Other specified disorders of cartilage, unspecified sites: Secondary | ICD-10-CM | POA: Diagnosis present

## 2011-08-04 DIAGNOSIS — I498 Other specified cardiac arrhythmias: Secondary | ICD-10-CM | POA: Diagnosis present

## 2011-08-04 DIAGNOSIS — J45909 Unspecified asthma, uncomplicated: Secondary | ICD-10-CM | POA: Diagnosis present

## 2011-08-04 LAB — POCT I-STAT TROPONIN I: Troponin i, poc: 0.01 ng/mL (ref 0.00–0.08)

## 2011-08-04 LAB — CBC WITH DIFFERENTIAL/PLATELET
Basophils Relative: 0 % (ref 0–1)
Eosinophils Absolute: 0.1 10*3/uL (ref 0.0–0.7)
Eosinophils Relative: 2 % (ref 0–5)
HCT: 33.3 % — ABNORMAL LOW (ref 39.0–52.0)
Hemoglobin: 11 g/dL — ABNORMAL LOW (ref 13.0–17.0)
Lymphs Abs: 1.6 10*3/uL (ref 0.7–4.0)
MCH: 32.1 pg (ref 26.0–34.0)
MCHC: 33 g/dL (ref 30.0–36.0)
MCV: 97.1 fL (ref 78.0–100.0)
Monocytes Absolute: 0.5 10*3/uL (ref 0.1–1.0)
Neutro Abs: 4.9 10*3/uL (ref 1.7–7.7)
Neutrophils Relative %: 68 % (ref 43–77)
RBC: 3.43 MIL/uL — ABNORMAL LOW (ref 4.22–5.81)

## 2011-08-04 LAB — POCT I-STAT, CHEM 8
BUN: 35 mg/dL — ABNORMAL HIGH (ref 6–23)
Calcium, Ion: 1.01 mmol/L — ABNORMAL LOW (ref 1.12–1.23)
Chloride: 110 mEq/L (ref 96–112)
Creatinine, Ser: 9.5 mg/dL — ABNORMAL HIGH (ref 0.50–1.35)
Glucose, Bld: 118 mg/dL — ABNORMAL HIGH (ref 70–99)
TCO2: 20 mmol/L (ref 0–100)

## 2011-08-04 LAB — PRO B NATRIURETIC PEPTIDE: Pro B Natriuretic peptide (BNP): 725.4 pg/mL — ABNORMAL HIGH (ref 0–125)

## 2011-08-04 LAB — MRSA PCR SCREENING
MRSA by PCR: NEGATIVE
MRSA by PCR: NEGATIVE

## 2011-08-04 MED ORDER — SIMVASTATIN 20 MG PO TABS
20.0000 mg | ORAL_TABLET | Freq: Every day | ORAL | Status: DC
  Administered 2011-08-04 – 2011-08-08 (×5): 20 mg via ORAL
  Filled 2011-08-04 (×6): qty 1

## 2011-08-04 MED ORDER — DOXAZOSIN MESYLATE 2 MG PO TABS
2.0000 mg | ORAL_TABLET | Freq: Two times a day (BID) | ORAL | Status: DC
  Administered 2011-08-04 – 2011-08-06 (×5): 2 mg via ORAL
  Filled 2011-08-04 (×7): qty 1

## 2011-08-04 MED ORDER — SODIUM CHLORIDE 0.9 % IV SOLN: 100.0000 mL | INTRAVENOUS | Status: DC | PRN

## 2011-08-04 MED ORDER — HYDRALAZINE HCL 20 MG/ML IJ SOLN
5.0000 mg | Freq: Once | INTRAMUSCULAR | Status: AC
  Administered 2011-08-04: 5 mg via INTRAVENOUS
  Filled 2011-08-04: qty 0.25

## 2011-08-04 MED ORDER — DOXAZOSIN MESYLATE 2 MG PO TABS: 2.0000 mg | ORAL_TABLET | Freq: Two times a day (BID) | ORAL | Status: DC

## 2011-08-04 MED ORDER — SODIUM CHLORIDE 0.9 % IJ SOLN
3.0000 mL | Freq: Two times a day (BID) | INTRAMUSCULAR | Status: DC
  Administered 2011-08-04 – 2011-08-08 (×9): 3 mL via INTRAVENOUS

## 2011-08-04 MED ORDER — ALBUTEROL SULFATE (5 MG/ML) 0.5% IN NEBU
5.0000 mg | INHALATION_SOLUTION | Freq: Once | RESPIRATORY_TRACT | Status: AC
  Administered 2011-08-04: 5 mg via RESPIRATORY_TRACT
  Filled 2011-08-04: qty 1

## 2011-08-04 MED ORDER — ALBUTEROL SULFATE (5 MG/ML) 0.5% IN NEBU
2.5000 mg | INHALATION_SOLUTION | RESPIRATORY_TRACT | Status: DC | PRN
  Filled 2011-08-04: qty 0.5

## 2011-08-04 MED ORDER — HEPARIN SODIUM (PORCINE) 1000 UNIT/ML DIALYSIS
1000.0000 [IU] | INTRAMUSCULAR | Status: DC | PRN
  Filled 2011-08-04: qty 1

## 2011-08-04 MED ORDER — ACETAMINOPHEN 325 MG PO TABS
650.0000 mg | ORAL_TABLET | Freq: Four times a day (QID) | ORAL | Status: DC | PRN
  Administered 2011-08-04 – 2011-08-07 (×5): 650 mg via ORAL
  Filled 2011-08-04 (×5): qty 2

## 2011-08-04 MED ORDER — ALBUMIN HUMAN 25 % IV SOLN
12.5000 g | Freq: Once | INTRAVENOUS | Status: AC
  Administered 2011-08-04: 12.5 g via INTRAVENOUS

## 2011-08-04 MED ORDER — HYDRALAZINE HCL 20 MG/ML IJ SOLN
10.0000 mg | Freq: Four times a day (QID) | INTRAMUSCULAR | Status: DC | PRN
  Administered 2011-08-04 – 2011-08-05 (×2): 10 mg via INTRAVENOUS
  Filled 2011-08-04: qty 0.5
  Filled 2011-08-04: qty 1

## 2011-08-04 MED ORDER — LIDOCAINE-PRILOCAINE 2.5-2.5 % EX CREA
1.0000 "application " | TOPICAL_CREAM | CUTANEOUS | Status: DC | PRN
  Filled 2011-08-04: qty 5

## 2011-08-04 MED ORDER — LIDOCAINE HCL (PF) 1 % IJ SOLN
5.0000 mL | INTRAMUSCULAR | Status: DC | PRN
  Filled 2011-08-04: qty 5

## 2011-08-04 MED ORDER — METHYLPREDNISOLONE SODIUM SUCC 125 MG IJ SOLR
60.0000 mg | Freq: Four times a day (QID) | INTRAMUSCULAR | Status: DC
  Administered 2011-08-04 – 2011-08-05 (×3): 60 mg via INTRAVENOUS
  Filled 2011-08-04: qty 2
  Filled 2011-08-04: qty 0.96
  Filled 2011-08-04 (×2): qty 2

## 2011-08-04 MED ORDER — NEPRO/CARBSTEADY PO LIQD
237.0000 mL | Freq: Three times a day (TID) | ORAL | Status: DC | PRN
  Filled 2011-08-04: qty 237

## 2011-08-04 MED ORDER — HEPARIN SODIUM (PORCINE) 1000 UNIT/ML DIALYSIS: 40.0000 [IU]/kg | INTRAMUSCULAR | Status: DC | PRN

## 2011-08-04 MED ORDER — IPRATROPIUM BROMIDE 0.02 % IN SOLN
0.5000 mg | Freq: Four times a day (QID) | RESPIRATORY_TRACT | Status: DC
  Administered 2011-08-04 – 2011-08-06 (×8): 0.5 mg via RESPIRATORY_TRACT
  Filled 2011-08-04 (×8): qty 2.5

## 2011-08-04 MED ORDER — FUROSEMIDE 10 MG/ML IJ SOLN
40.0000 mg | Freq: Once | INTRAMUSCULAR | Status: AC
  Administered 2011-08-04: 40 mg via INTRAVENOUS
  Filled 2011-08-04: qty 4

## 2011-08-04 MED ORDER — HYDROXYZINE HCL 25 MG PO TABS: 25.0000 mg | ORAL_TABLET | Freq: Three times a day (TID) | ORAL | Status: DC | PRN

## 2011-08-04 MED ORDER — ONDANSETRON HCL 4 MG/2ML IJ SOLN
4.0000 mg | Freq: Four times a day (QID) | INTRAMUSCULAR | Status: DC | PRN
  Administered 2011-08-04: 4 mg via INTRAVENOUS
  Filled 2011-08-04: qty 2

## 2011-08-04 MED ORDER — ZOLPIDEM TARTRATE 5 MG PO TABS
5.0000 mg | ORAL_TABLET | Freq: Every day | ORAL | Status: DC
  Administered 2011-08-04 – 2011-08-05 (×2): 5 mg via ORAL
  Filled 2011-08-04 (×2): qty 1

## 2011-08-04 MED ORDER — ACETAMINOPHEN 650 MG RE SUPP: 650.0000 mg | Freq: Four times a day (QID) | RECTAL | Status: DC | PRN

## 2011-08-04 MED ORDER — CAMPHOR-MENTHOL 0.5-0.5 % EX LOTN
1.0000 "application " | TOPICAL_LOTION | Freq: Three times a day (TID) | CUTANEOUS | Status: DC | PRN
  Filled 2011-08-04: qty 222

## 2011-08-04 MED ORDER — DOCUSATE SODIUM 283 MG RE ENEM
1.0000 | ENEMA | RECTAL | Status: DC | PRN
  Filled 2011-08-04: qty 1

## 2011-08-04 MED ORDER — ZOLPIDEM TARTRATE 5 MG PO TABS: 5.0000 mg | ORAL_TABLET | Freq: Every evening | ORAL | Status: DC | PRN

## 2011-08-04 MED ORDER — SORBITOL 70 % SOLN
30.0000 mL | Status: DC | PRN
  Filled 2011-08-04: qty 30

## 2011-08-04 MED ORDER — ALTEPLASE 2 MG IJ SOLR
2.0000 mg | Freq: Once | INTRAMUSCULAR | Status: AC | PRN
  Filled 2011-08-04: qty 2

## 2011-08-04 MED ORDER — CALCIUM ACETATE 667 MG PO CAPS
2668.0000 mg | ORAL_CAPSULE | Freq: Three times a day (TID) | ORAL | Status: DC
  Administered 2011-08-04 – 2011-08-06 (×5): 2668 mg via ORAL
  Filled 2011-08-04 (×8): qty 4

## 2011-08-04 MED ORDER — PENTAFLUOROPROP-TETRAFLUOROETH EX AERO
1.0000 "application " | INHALATION_SPRAY | CUTANEOUS | Status: DC | PRN
  Filled 2011-08-04: qty 103.5

## 2011-08-04 MED ORDER — BIOTENE DRY MOUTH MT LIQD
15.0000 mL | Freq: Two times a day (BID) | OROMUCOSAL | Status: DC
  Administered 2011-08-04 – 2011-08-05 (×2): 15 mL via OROMUCOSAL

## 2011-08-04 MED ORDER — ASPIRIN EC 81 MG PO TBEC
81.0000 mg | DELAYED_RELEASE_TABLET | Freq: Every day | ORAL | Status: DC
  Administered 2011-08-04: 81 mg via ORAL

## 2011-08-04 MED ORDER — HEPARIN SODIUM (PORCINE) 5000 UNIT/ML IJ SOLN
5000.0000 [IU] | Freq: Three times a day (TID) | INTRAMUSCULAR | Status: DC
  Administered 2011-08-04 – 2011-08-09 (×15): 5000 [IU] via SUBCUTANEOUS
  Filled 2011-08-04 (×18): qty 1

## 2011-08-04 MED ORDER — ALBUMIN HUMAN 25 % IV SOLN
INTRAVENOUS | Status: AC
  Administered 2011-08-04: 12.5 g via INTRAVENOUS
  Filled 2011-08-04: qty 50

## 2011-08-04 MED ORDER — ASPIRIN EC 81 MG PO TBEC
81.0000 mg | DELAYED_RELEASE_TABLET | Freq: Every day | ORAL | Status: DC
  Administered 2011-08-04 – 2011-08-09 (×6): 81 mg via ORAL
  Filled 2011-08-04 (×6): qty 1

## 2011-08-04 MED ORDER — ALBUTEROL SULFATE (5 MG/ML) 0.5% IN NEBU: 5.0000 mg | INHALATION_SOLUTION | Freq: Once | RESPIRATORY_TRACT | Status: AC

## 2011-08-04 MED ORDER — ASPIRIN 81 MG PO TABS: 81.0000 mg | ORAL_TABLET | Freq: Every day | ORAL | Status: DC

## 2011-08-04 MED ORDER — SODIUM CHLORIDE 0.9 % IV SOLN: 250.0000 mL | INTRAVENOUS | Status: DC | PRN

## 2011-08-04 MED ORDER — NEPRO/CARBSTEADY PO LIQD
237.0000 mL | ORAL | Status: DC | PRN
  Filled 2011-08-04: qty 237

## 2011-08-04 MED ORDER — RENA-VITE PO TABS
1.0000 | ORAL_TABLET | Freq: Every day | ORAL | Status: DC
  Administered 2011-08-04 – 2011-08-08 (×5): 1 via ORAL
  Filled 2011-08-04 (×7): qty 1

## 2011-08-04 MED ORDER — ONDANSETRON HCL 4 MG PO TABS
4.0000 mg | ORAL_TABLET | Freq: Four times a day (QID) | ORAL | Status: DC | PRN
  Administered 2011-08-05: 4 mg via ORAL
  Filled 2011-08-04: qty 1

## 2011-08-04 MED ORDER — CALCIUM CARBONATE 1250 MG/5ML PO SUSP
500.0000 mg | Freq: Four times a day (QID) | ORAL | Status: DC | PRN
  Filled 2011-08-04: qty 5

## 2011-08-04 MED ORDER — ALBUTEROL SULFATE (5 MG/ML) 0.5% IN NEBU
2.5000 mg | INHALATION_SOLUTION | Freq: Four times a day (QID) | RESPIRATORY_TRACT | Status: DC
  Administered 2011-08-04 – 2011-08-06 (×8): 2.5 mg via RESPIRATORY_TRACT
  Filled 2011-08-04 (×7): qty 0.5

## 2011-08-04 MED ORDER — HEPARIN SODIUM (PORCINE) 1000 UNIT/ML DIALYSIS
40.0000 [IU]/kg | INTRAMUSCULAR | Status: DC | PRN
  Administered 2011-08-07: 4200 [IU] via INTRAVENOUS_CENTRAL
  Filled 2011-08-04: qty 5

## 2011-08-04 MED ORDER — VITAMIN C 500 MG PO TABS
500.0000 mg | ORAL_TABLET | Freq: Every day | ORAL | Status: DC
  Administered 2011-08-05 – 2011-08-09 (×5): 500 mg via ORAL
  Filled 2011-08-04 (×6): qty 1

## 2011-08-04 MED ORDER — SODIUM CHLORIDE 0.9 % IJ SOLN: 3.0000 mL | INTRAMUSCULAR | Status: DC | PRN

## 2011-08-04 MED ORDER — RENA-VITE PO TABS: 1.0000 | ORAL_TABLET | Freq: Every day | ORAL | Status: DC

## 2011-08-04 MED ORDER — CALCIUM ACETATE 667 MG PO CAPS
2001.0000 mg | ORAL_CAPSULE | Freq: Three times a day (TID) | ORAL | Status: DC
  Filled 2011-08-04 (×3): qty 3

## 2011-08-04 NOTE — Progress Notes (Signed)
Patient transferred from [facility] ED via [place] to Hemodialysis unit.  Is very dyspneic and on Bi pap.  For dialysis and then to be admitted to [id number]

## 2011-08-04 NOTE — ED Notes (Signed)
Pt alert, arrives from home, c/o SOB, onset this evening, pt hx renal failure, dialyis on Tu, Th, Sat, initial O2 sat 88 Ra, resp even labored, lungs diminished, MD bedside to eval

## 2011-08-04 NOTE — Consult Note (Addendum)
Reason for Consult: Pulmonary edema with respiratory distress in a patient with ESRD Referring Physician: April Palumbo-Raisch MD (ER Physician)   HPI: 51 year old Caucasian man with a history of ESRD on a Tuesday/Thursday/Saturday schedule at [facility] who presented to [facility] with acute worsening of shortness of breath. He went to his routine dialysis on Thursday but cut treatment short after 3 1/2hr (of prescribed 4 1/4hrs) as he did not feel well. Has a cough with clear/white sputum, endorses Pleuritic type chest pain and denies any diarrhea. Had some vomiting last night without hematemesis. Denies any fever/chills. He was started on BIPAP at [facility] after noted to be in hypoxic respiratory distress.  Dialysis prescription:  4 1/4hr, EDW 99.5Kg, 3K/2.25Ca, QB 150, QD AF 1.5, 15G Needles- LFA AVF (buttonhole). Heparin 8000units TIW, Epogen 1200units TIW and Infed 50mg  Q WEEK.  Past Medical History  Diagnosis Date  . Anxiety   . Chronic headaches   . Chronic kidney disease (CKD)   . Dialysis care 02/06/2010  . HTN (hypertension)   . Sleep apnea     wears the CPAP    Past Surgical History  Procedure Date  . Inner ear surgery     for deafness at age 11  . Colonoscopy   . Dialysis shunt placement     Family History  Problem Relation Age of Onset  . Colon cancer Neg Hx   . Esophageal cancer Neg Hx   . Rectal cancer Neg Hx   . Stomach cancer Neg Hx     Social History:  reports that he has been smoking Cigarettes.  He has been smoking about .5 packs per day. He has never used smokeless tobacco. He reports that he does not drink alcohol or use illicit drugs.  Allergies:  Allergies  Allergen Reactions  . Codeine Nausea And Vomiting    Medications: Current facility-administered medications:0.9 %  sodium chloride infusion, 100 mL, Intravenous, PRN, Ulice Dash K. Posey Pronto, MD;  0.9 %  sodium chloride infusion, 100 mL, Intravenous, PRN, Ulice Dash K. Posey Pronto, MD;  albuterol (PROVENTIL) (5 MG/ML) 0.5%  nebulizer solution 5 mg, 5 mg, Nebulization, Once, April K Palumbo-Rasch, MD;  albuterol (PROVENTIL) (5 MG/ML) 0.5% nebulizer solution 5 mg, 5 mg, Nebulization, Once, April K Palumbo-Rasch, MD, 5 mg at 08/04/11 0557 alteplase (CATHFLO ACTIVASE) injection 2 mg, 2 mg, Intracatheter, Once PRN, Ulice Dash K. Posey Pronto, MD;  feeding supplement (NEPRO CARB STEADY) liquid 237 mL, 237 mL, Oral, PRN, Ulice Dash K. Posey Pronto, MD;  furosemide (LASIX) injection 40 mg, 40 mg, Intravenous, Once, April K Palumbo-Rasch, MD, 40 mg at 08/04/11 0533;  heparin injection 1,000 Units, 1,000 Units, Dialysis, PRN, Clayborne Dana. Posey Pronto, MD heparin injection 40 Units/kg, 40 Units/kg, Dialysis, PRN, Clayborne Dana. Posey Pronto, MD;  hydrALAZINE (APRESOLINE) injection 5 mg, 5 mg, Intravenous, Once, April K Palumbo-Rasch, MD, 5 mg at 08/04/11 0608;  lidocaine (XYLOCAINE) 1 % injection 5 mL, 5 mL, Intradermal, PRN, Ulice Dash K. Posey Pronto, MD;  lidocaine-prilocaine (EMLA) cream 1 application, 1 application, Topical, PRN, Clayborne Dana. Posey Pronto, MD pentafluoroprop-tetrafluoroeth Landry Dyke) aerosol 1 application, 1 application, Topical, PRN, Clayborne Dana. Posey Pronto, MD Current outpatient prescriptions:aspirin 81 MG tablet, Take 81 mg by mouth daily., Disp: , Rfl: ;  calcium acetate (PHOSLO) 667 MG capsule, Take 2,001 mg by mouth 3 (three) times daily with meals. Take 1 capsule with snacks, Disp: , Rfl: ;  doxazosin (CARDURA) 2 MG tablet, Take 2 mg by mouth 2 (two) times daily. , Disp: , Rfl: ;  multivitamin (RENA-VIT) TABS tablet, Take 1  tablet by mouth daily., Disp: , Rfl:  pravastatin (PRAVACHOL) 20 MG tablet, Take 20 mg by mouth daily., Disp: , Rfl: ;  traMADol (ULTRAM) 50 MG tablet, Take 100 mg by mouth every 6 (six) hours as needed. For pain, Disp: , Rfl: ;  vitamin C (ASCORBIC ACID) 500 MG tablet, Take 500 mg by mouth daily., Disp: , Rfl: ;  zolpidem (AMBIEN) 5 MG tablet, Take 5 mg by mouth at bedtime., Disp: , Rfl:   Results for orders placed during the hospital encounter of 08/04/11 (from the past 48  hour(s))  CBC WITH DIFFERENTIAL     Status: Abnormal   Collection Time   08/04/11  5:20 AM      Component Value Range Comment   WBC 7.1  4.0 - 10.5 K/uL    RBC 3.43 (*) 4.22 - 5.81 MIL/uL    Hemoglobin 11.0 (*) 13.0 - 17.0 g/dL    HCT 33.3 (*) 39.0 - 52.0 %    MCV 97.1  78.0 - 100.0 fL    MCH 32.1  26.0 - 34.0 pg    MCHC 33.0  30.0 - 36.0 g/dL    RDW 12.4  11.5 - 15.5 %    Platelets 109 (*) 150 - 400 K/uL    Neutrophils Relative 68  43 - 77 %    Lymphocytes Relative 23  12 - 46 %    Monocytes Relative 7  3 - 12 %    Eosinophils Relative 2  0 - 5 %    Basophils Relative 0  0 - 1 %    Neutro Abs 4.9  1.7 - 7.7 K/uL    Lymphs Abs 1.6  0.7 - 4.0 K/uL    Monocytes Absolute 0.5  0.1 - 1.0 K/uL    Eosinophils Absolute 0.1  0.0 - 0.7 K/uL    Basophils Absolute 0.0  0.0 - 0.1 K/uL    Smear Review MORPHOLOGY UNREMARKABLE     PRO B NATRIURETIC PEPTIDE     Status: Abnormal   Collection Time   08/04/11  5:20 AM      Component Value Range Comment   Pro B Natriuretic peptide (BNP) 725.4 (*) 0 - 125 pg/mL   POCT I-STAT, CHEM 8     Status: Abnormal   Collection Time   08/04/11  5:26 AM      Component Value Range Comment   Sodium 142  135 - 145 mEq/L    Potassium 4.2  3.5 - 5.1 mEq/L    Chloride 110  96 - 112 mEq/L    BUN 35 (*) 6 - 23 mg/dL    Creatinine, Ser 9.50 (*) 0.50 - 1.35 mg/dL    Glucose, Bld 118 (*) 70 - 99 mg/dL    Calcium, Ion 1.01 (*) 1.12 - 1.23 mmol/L    TCO2 20  0 - 100 mmol/L    Hemoglobin 11.6 (*) 13.0 - 17.0 g/dL    HCT 34.0 (*) 39.0 - 52.0 %   POCT I-STAT TROPONIN I     Status: Normal   Collection Time   08/04/11  5:30 AM      Component Value Range Comment   Troponin i, poc 0.01  0.00 - 0.08 ng/mL    Comment 3              Dg Chest Port 1 View  08/04/2011  *RADIOLOGY REPORT*  Clinical Data: Shortness of breath  PORTABLE CHEST - 1 VIEW  Comparison: 10/26/2010  Findings: There  is mild interstitial prominence.  A mild interstitial edema pattern is not excluded.  Heart  size appears mildly prominent compared to prior however this may be exaggerated by portable technique/ inspiratory effort as well.  Mild central vascular fullness.  No pleural effusion or pneumothorax.  No acute osseous finding.  IMPRESSION: Mild interstitial prominence may be accentuated by portable technique however a mild interstitial edema or atypical infection is not excluded.  Cardiac contour appears more prominent in the interval. Mediastinal contours within normal range.  Both of the above findings can be better characterized with PA and lateral views when the patient can tolerate.  Original Report Authenticated By: Suanne Marker, M.D.    Review of Systems  Unable to perform ROS: severe respiratory distress   Blood pressure 175/73, pulse 101, resp. rate 33, SpO2 96.00%. Physical Exam  Nursing note and vitals reviewed. Constitutional: He appears well-developed and well-nourished. He appears distressed.  HENT:  Head: Normocephalic and atraumatic.  Nose: Nose normal.  Mouth/Throat: Oropharynx is clear and moist. No oropharyngeal exudate.       On BIPAP  Eyes: EOM are normal. Pupils are equal, round, and reactive to light. No scleral icterus.  Neck: Normal range of motion. Neck supple. JVD present. No tracheal deviation present. No thyromegaly present.       7cm JVD  Cardiovascular: Normal rate, regular rhythm and normal heart sounds.  Exam reveals no gallop and no friction rub.   No murmur heard. Respiratory: No stridor. He is in respiratory distress. He has wheezes. He has rales. He exhibits no tenderness.  GI: Soft. Bowel sounds are normal. He exhibits no distension and no mass. There is no tenderness. There is no rebound and no guarding.  Musculoskeletal: Normal range of motion. He exhibits no tenderness.       Trace LE edema  Lymphadenopathy:    He has no cervical adenopathy.  Neurological: He is alert.  Skin: Skin is warm and dry. No rash noted. He is not diaphoretic. No  erythema.  Psychiatric: He has a normal mood and affect.    Assessment/Plan: 1. Respiratory distress with pulmonary edema: Patient examined, CXR reviewed and does not appear to have florid edema- will do urgent HD to attempt UF and get to EDW to see if this helps respiratory symptoms/weaning off BIPAP. 2. ESRD: Urgent HD today and re-evaluate again tomorrow for acute needs. 3. Anemia: Hgb currently at goal and will likely improve after HD/UF 4. Metabolic bone disease: Restart Phoslo with meals TIDAC. Not on VDRA/Sensipar- Last PTH 83. 5. Hypertension: Restart doxazosin and amlodipine  Eusebio Blazejewski K. 08/04/2011, 7:38 AM

## 2011-08-04 NOTE — Progress Notes (Signed)
Pt. Placed on cpap, auto-titrate mode as per order. Pt. Is tolerating well.

## 2011-08-04 NOTE — Progress Notes (Signed)
Attempt made to wean from 50% venti mask to 6 liter nasal cannula.  O2 sat drifting down to mid 80s; venti mask replaced.  O2 sat subsequently back to low 90s.

## 2011-08-04 NOTE — ED Provider Notes (Addendum)
History     CSN: [id number]  Arrival date & time 08/04/11  [id number]   First MD Initiated Contact with Patient 08/04/11 [id number]      Chief Complaint  Patient presents with  . Shortness of Breath    (Consider location/radiation/quality/duration/timing/severity/associated sxs/prior treatment) Patient is a 51 y.o. male presenting with shortness of breath. The history is provided by a relative. The history is limited by the condition of the patient. No language interpreter was used.  Shortness of Breath  The current episode started today. The onset was sudden. The problem occurs continuously. The problem has been gradually worsening. The problem is severe. Nothing relieves the symptoms. Nothing aggravates the symptoms. Associated symptoms include shortness of breath. Pertinent negatives include no chest pain, no fever and no stridor. There was no intake of a foreign body. His past medical history does not include asthma. He has been behaving normally.  Dialyzed on Thursday but had to stop early now at ED with SOB.    Past Medical History  Diagnosis Date  . Anxiety   . Chronic headaches   . Chronic kidney disease (CKD)   . Dialysis care 02/06/2010  . HTN (hypertension)   . Sleep apnea     wears the CPAP    Past Surgical History  Procedure Date  . Inner ear surgery     for deafness at age 43  . Colonoscopy   . Dialysis shunt placement     Family History  Problem Relation Age of Onset  . Colon cancer Neg Hx   . Esophageal cancer Neg Hx   . Rectal cancer Neg Hx   . Stomach cancer Neg Hx     History  Substance Use Topics  . Smoking status: Current Everyday Smoker -- 0.5 packs/day    Types: Cigarettes  . Smokeless tobacco: Never Used  . Alcohol Use: No      Review of Systems  Unable to perform ROS Constitutional: Negative for fever.  Respiratory: Positive for shortness of breath. Negative for stridor.   Cardiovascular: Negative for chest pain.    Allergies   Codeine  Home Medications   Current Outpatient Rx  Name Route Sig Dispense Refill  . AMLODIPINE BESYLATE 5 MG PO TABS Oral Take 5 mg by mouth daily.    . ASPIRIN 81 MG PO TABS Oral Take 81 mg by mouth daily.    Marland Kitchen CALCIUM ACETATE 667 MG PO CAPS Oral Take 2,001 mg by mouth 3 (three) times daily with meals. Take 1 capsule with snacks    . DOXAZOSIN MESYLATE 2 MG PO TABS Oral Take 2 mg by mouth at bedtime.    Marland Kitchen MOVIPREP 100 G PO SOLR Oral Take 1 kit (100 g total) by mouth as directed. Name brand only 1 kit 0    Dispense as written.  Marland Kitchen RENA-VITE PO TABS Oral Take 1 tablet by mouth daily.    Marland Kitchen OMEPRAZOLE 20 MG PO CPDR Oral Take 1 capsule (20 mg total) by mouth daily. 30 capsule 0  . PRAVASTATIN SODIUM 20 MG PO TABS Oral Take 20 mg by mouth daily.    . TRAMADOL HCL 50 MG PO TABS Oral Take 100 mg by mouth every 6 (six) hours as needed. For pain    . VITAMIN C 500 MG PO TABS Oral Take 500 mg by mouth daily.    Marland Kitchen ZOLPIDEM TARTRATE 5 MG PO TABS Oral Take 5 mg by mouth at bedtime.      BP 172/70  Pulse 101  Resp 22  SpO2 88%  Physical Exam  Constitutional: He appears well-nourished.  HENT:  Head: Normocephalic and atraumatic.  Eyes: Conjunctivae are normal. Pupils are equal, round, and reactive to light.  Neck: Normal range of motion. Neck supple.  Cardiovascular: Tachycardia present.  Exam reveals distant heart sounds.   Pulmonary/Chest: No stridor. He has decreased breath sounds. He has rales.  Abdominal: Soft. Bowel sounds are normal. There is no tenderness. There is no rebound and no guarding.  Musculoskeletal: Normal range of motion.  Neurological: He is alert.  Skin: Skin is warm and dry.  Psychiatric: He has a normal mood and affect.    ED Course  Procedures (including critical care time)   Labs Reviewed  CBC WITH DIFFERENTIAL  PRO B NATRIURETIC PEPTIDE   No results found.   No diagnosis found.    MDM   Date: 08/04/2011  Rate: 104  Rhythm: sinus tachycardia   QRS Axis: normal  Intervals: normal  ST/T Wave abnormalities: normal  Conduction Disutrbances: none  Narrative Interpretation:sinus tachycardia   Patient on BIPAP but not in need of intubation.     550 Case d/w Dr. Posey Pronto of renal will accept to ICU for dialysis 555 Case d/w Dr. Conception Chancy of PCCM send if truck available  MDM Number of Diagnoses or Management Options Pulmonary edema:  MDM Reviewed: vitals and nursing note Interpretation: ECG and labs Consults: renal and PCCM.    CRITICAL CARE Performed by: Carlisle Beers   Total critical care time: 30 minutes Critical care time was exclusive of separately billable procedures and treating other patients.  Critical care was necessary to treat or prevent imminent or life-threatening deterioration.  Critical care was time spent personally by me on the following activities: development of treatment plan with patient and/or surrogate as well as nursing, discussions with consultants, evaluation of patient's response to treatment, examination of patient, obtaining history from patient or surrogate, ordering and performing treatments and interventions, ordering and review of laboratory studies, ordering and review of radiographic studies, pulse oximetry and re-evaluation of patient's condition.  Carlisle Beers, MD 08/04/11 0559  Katana Berthold K Magan Winnett-Rasch, MD 08/04/11 [phone]

## 2011-08-04 NOTE — H&P (Signed)
Name: REGIONALD SCHUELKE MRN: [id number] DOB: [birth year]    LOS: 0  PCCM ADMISSION NOTE  History of Present Illness: 51 year old Caucasian man with a history of ESRD on a Tuesday/Thursday/Saturday who presented to [facility] with acute worsening of shortness of breath.  He went to his routine dialysis on Thursday but cut treatment short after 3 1/2hr (of prescribed 4 1/4hrs) as he did not feel well. CXR on presentation with B interstitial pattern. Was seen by Dr Posey Pronto and transferred immediately for HD at [facility]. Was placed temporarily on BiPAP with stabilization   Lines / Drains:  Cultures: Blood 7/13 >> Urine 7/13 >>  Antibiotics:  Tests / Events: CXR 7/13 >> B interstitial prominence most consistent with pulm edema, low likelihood atypical infxn  Past Medical History  Diagnosis Date  . Anxiety   . Chronic headaches   . Chronic kidney disease (CKD)   . Dialysis care 02/06/2010  . HTN (hypertension)   . Sleep apnea     wears the CPAP   Past Surgical History  Procedure Date  . Inner ear surgery     for deafness at age 77  . Colonoscopy   . Dialysis shunt placement    Prior to Admission medications   Medication Sig Start Date End Date Taking? Authorizing Provider  aspirin 81 MG tablet Take 81 mg by mouth daily.   Yes Historical Provider, MD  calcium acetate (PHOSLO) 667 MG capsule Take 2,001 mg by mouth 3 (three) times daily with meals. Take 1 capsule with snacks   Yes Historical Provider, MD  doxazosin (CARDURA) 2 MG tablet Take 2 mg by mouth 2 (two) times daily.    Yes Historical Provider, MD  multivitamin (RENA-VIT) TABS tablet Take 1 tablet by mouth daily.   Yes Historical Provider, MD  pravastatin (PRAVACHOL) 20 MG tablet Take 20 mg by mouth daily.   Yes Historical Provider, MD  traMADol (ULTRAM) 50 MG tablet Take 100 mg by mouth every 6 (six) hours as needed. For pain   Yes Historical Provider, MD  vitamin C (ASCORBIC ACID) 500 MG tablet Take 500 mg by mouth daily.   Yes  Historical Provider, MD  zolpidem (AMBIEN) 5 MG tablet Take 5 mg by mouth at bedtime.   Yes Historical Provider, MD   Allergies Allergies  Allergen Reactions  . Codeine Nausea And Vomiting    Family History Family History  Problem Relation Age of Onset  . Colon cancer Neg Hx   . Esophageal cancer Neg Hx   . Rectal cancer Neg Hx   . Stomach cancer Neg Hx     Social History  reports that he has been smoking Cigarettes.  He has been smoking about .5 packs per day. He has never used smokeless tobacco. He reports that he does not drink alcohol or use illicit drugs.  Review Of Systems  11 points review of systems is negative with an exception of listed in HPI.  Vital Signs: Pulse Rate:  [101] 101  (07/13 0540) Resp:  [22-33] 33  (07/13 0540) BP: (172-175)/(70-73) 175/73 mmHg (07/13 0608) SpO2:  [88 %-96 %] 96 % (07/13 0555) FiO2 (%):  [40 %] 40 % (07/13 0555)    Physical Examination: General:  Ill appearing, NAD on high flow O2 Neuro:  Awake and alert, moves all ext   HEENT:  OP clear Neck:  No LAD, no stridor   Cardiovascular:  Tachy, regular Lungs:  Distant, B exp wheezes Abdomen:  Obese, soft, NT  Musculoskeletal:  Trace B LE edema Skin:  No rash  Ventilator settings: Vent Mode:  [-]  FiO2 (%):  [40 %] 40 %  Labs and Imaging:  Reviewed.  Please refer to the Assessment and Plan section for relevant results.  Assessment and Plan: PULMONARY A: Acute Resp failure, likely due to volume excess superimposed on OSA. Hx tobacco/COPD.  P:  - Urgent HD for volume removal - BiPAP prn until stabilized, no current indication ET intubation - prn albuterol; consider scheduled BD's for discharge - consider steroid burst given exp wheezes - review PFT if available - CPAP qhs  CARDIOLOGY A: HTN P:  - start home doxazosin  - ASA 81  RENAL  Lab 08/04/11 0526  NA 142  K 4.2  CL 110  CO2 --  GLUCOSE 118*  BUN 35*  CREATININE 9.50*  CALCIUM --  MG --  PHOS --   A:  ESRD with acute volume overload P:  - HD now and follow volume status, electrolytes - home phoslo  GASTROINTESTINAL A: P: - PO diet  INFECTIOUS: A: no current evidence infection, although must consider viral pneumonitis or evan HCAP given his infectious prodrome P: - empiric cultures, no abx at this time but low threshold to start if no better after HD/UF  HEMATOLOGY: A: Anemia of chronic disease P: - follow CBC  ENDOCRINE: A: P:  NEURO:  A: no acute issues P:  - follow MS   Best practices / Disposition: -->ICU status under PCCM -->full code -->Heparin for DVT Px -->Gi prophylaxis not indicated -->diet >> renal  CC 60 minutes  Baltazar Apo, MD, PhD 08/04/2011, 8:59 AM [facility] [phone] or if no answer [phone]

## 2011-08-04 NOTE — ED Notes (Signed)
[id number] Expected date:<BR> Expected time:<BR> Means of arrival:<BR> Comments:<BR> SOB

## 2011-08-05 ENCOUNTER — Inpatient Hospital Stay (HOSPITAL_COMMUNITY): Payer: BC Managed Care – PPO

## 2011-08-05 DIAGNOSIS — G4733 Obstructive sleep apnea (adult) (pediatric): Secondary | ICD-10-CM | POA: Diagnosis present

## 2011-08-05 DIAGNOSIS — J9600 Acute respiratory failure, unspecified whether with hypoxia or hypercapnia: Principal | ICD-10-CM | POA: Diagnosis present

## 2011-08-05 DIAGNOSIS — K529 Noninfective gastroenteritis and colitis, unspecified: Secondary | ICD-10-CM | POA: Diagnosis present

## 2011-08-05 DIAGNOSIS — J45909 Unspecified asthma, uncomplicated: Secondary | ICD-10-CM | POA: Diagnosis present

## 2011-08-05 DIAGNOSIS — K5289 Other specified noninfective gastroenteritis and colitis: Secondary | ICD-10-CM

## 2011-08-05 DIAGNOSIS — Z9989 Dependence on other enabling machines and devices: Secondary | ICD-10-CM | POA: Diagnosis present

## 2011-08-05 LAB — BASIC METABOLIC PANEL
Chloride: 93 mEq/L — ABNORMAL LOW (ref 96–112)
Creatinine, Ser: 8.04 mg/dL — ABNORMAL HIGH (ref 0.50–1.35)
GFR calc Af Amer: 8 mL/min — ABNORMAL LOW (ref >90)
Sodium: 136 mEq/L (ref 135–145)

## 2011-08-05 LAB — CBC
MCV: 96.5 fL (ref 78.0–100.0)
Platelets: 116 10*3/uL — ABNORMAL LOW (ref 150–400)
RDW: 12.6 % (ref 11.5–15.5)
WBC: 8 10*3/uL (ref 4.0–10.5)

## 2011-08-05 LAB — MAGNESIUM: Magnesium: 2.3 mg/dL (ref 1.5–2.5)

## 2011-08-05 MED ORDER — TRAMADOL HCL 50 MG PO TABS
50.0000 mg | ORAL_TABLET | Freq: Four times a day (QID) | ORAL | Status: DC | PRN
  Administered 2011-08-05 – 2011-08-06 (×2): 50 mg via ORAL
  Filled 2011-08-05 (×2): qty 1

## 2011-08-05 MED ORDER — METHYLPREDNISOLONE SODIUM SUCC 125 MG IJ SOLR
60.0000 mg | Freq: Once | INTRAMUSCULAR | Status: AC
  Administered 2011-08-05: 60 mg via INTRAVENOUS

## 2011-08-05 MED ORDER — PREDNISONE 50 MG PO TABS
50.0000 mg | ORAL_TABLET | Freq: Every day | ORAL | Status: DC
  Administered 2011-08-06 – 2011-08-08 (×3): 50 mg via ORAL
  Filled 2011-08-05 (×6): qty 1

## 2011-08-05 NOTE — Progress Notes (Signed)
Name: Kirk [name] MRN: [id number] DOB: 19-Dec-1960    LOS: 1  PCCM ADMISSION NOTE  History of Present Illness: 51 year old Caucasian man with a history of ESRD on a Tuesday/Thursday/Saturday who presented to [facility] with acute worsening of shortness of breath.  He went to his routine dialysis on Thursday but cut treatment short after 3 1/2hr (of prescribed 4 1/4hrs) as he did not feel well. CXR on presentation with B interstitial pattern. Was seen by Dr Posey Pronto and transferred immediately for HD at [facility]. Was placed temporarily on BiPAP with stabilization   Lines / Drains:  Cultures: Blood 7/13 >> Urine 7/13 >>  Antibiotics:  Tests / Events: CXR 7/13 >> B interstitial prominence most consistent with pulm edema, low likelihood atypical infxn CXR 7/14 >> significant improvement B infiltrates  Subjective:  Much more comfortable Wore CPAP last night as planned  Vital Signs: Temp:  [97.6 F (36.4 C)-99.3 F (37.4 C)] 98.4 F (36.9 C) (07/14 0400) Pulse Rate:  [87-111] 111  (07/14 0907) Resp:  [16-34] 21  (07/14 0907) BP: (79-176)/(41-88) 153/64 mmHg (07/14 0907) SpO2:  [90 %-100 %] 96 % (07/14 0907) FiO2 (%):  [50 %] 50 % (07/13 2000) Weight:  [98.3 kg (216 lb 11.4 oz)-112.3 kg (247 lb 9.2 oz)] 99.7 kg (219 lb 12.8 oz) (07/14 0400) I/O last 3 completed shifts: In: [id number] [P.O.:1090; I.V.:10; IV Piggyback:2] Out: [id number] [Urine:50; [id number]  Intake/Output Summary (Last 24 hours) at 08/05/11 0925 Last data filed at 08/05/11 0900  Gross per 24 hour  Intake   1302 ml  Output   4708 ml  Net  -3406 ml    Physical Examination: General:  Much more comfortable, now on [place] O2 Neuro:  Awake and alert, moves all ext   HEENT:  OP clear Neck:  No LAD, no stridor   Cardiovascular:  Tachy, regular Lungs:  Distant, B exp wheezes but better than 7/13 Abdomen:  Obese, soft, NT Musculoskeletal:  Trace B LE edema Skin:  No rash  Ventilator settings: Vent Mode:  [-]  FiO2 (%):  [50 %] 50  %  Labs and Imaging:  Reviewed.  Please refer to the Assessment and Plan section for relevant results.  Assessment and Plan: PULMONARY A: Acute Resp failure, likely due to volume excess superimposed on OSA. Hx tobacco/COPD. Continued wheeze after volume removal consistent with a concomitant AE-COPD P:  - HD and vol removal per renal plans - scheduled BD's now and probably for discharge - change to pred and treat for COPD exacerbation - review PFT if available on Monday - CPAP qhs  CARDIOLOGY A: HTN P:  - home doxazosin  - ASA 81  RENAL  Lab 08/05/11 0530 08/04/11 0526  NA 136 142  K 4.1 4.2  CL 93* 110  CO2 26 --  GLUCOSE 198* 118*  BUN 33* 35*  CREATININE 8.04* 9.50*  CALCIUM 10.4 --  MG 2.3 --  PHOS 2.6 --   A: ESRD with acute volume overload P:  - HD and follow volume status, electrolytes - home phoslo  GASTROINTESTINAL A: Apparent gastroenteritis prior to admission, ? Viral syndrome P: - PO diet as he can tolerate  INFECTIOUS: A: no current evidence infection, although must consider viral pneumonitis or even HCAP given his infectious prodrome P: - empiric cultures, no abx at this time but low threshold to start if declines  HEMATOLOGY: A: Anemia of chronic disease P: - follow CBC  ENDOCRINE: A: P:  NEURO:  A:  no acute issues P:  - follow MS   Best practices / Disposition: -->ICU status under PCCM >> transfer to floor bed 7/14 -->full code -->Heparin for DVT Px -->Gi prophylaxis not indicated -->diet >> renal   Baltazar Apo, MD, PhD 08/05/2011, 9:24 AM [facility] [phone] or if no answer [phone]

## 2011-08-05 NOTE — Progress Notes (Signed)
Patient ID: Kirk [name], male   DOB: April 28, 1960, 51 y.o.   MRN: [id number]   [facility] Progress Note    Subjective:   Reports a "rough night" with some transient nausea this AM- breathing improved   Objective:   BP 153/64  Pulse 111  Temp 98.4 F (36.9 C) (Oral)  Resp 21  Ht 5' 7.5" (1.715 m)  Wt 99.7 kg (219 lb 12.8 oz)  BMI 33.92 kg/m2  SpO2 96%  Physical Exam: [id number] resting in bed [id number] RRR, normal S1 and S2  Resp:Coarse BS with rare expiratory wheeze [id number], obese, NT, BS normal Ext:No LE edema  Labs: BMET  Lab 08/05/11 0530 08/04/11 0526  NA 136 142  K 4.1 4.2  CL 93* 110  CO2 26 --  GLUCOSE 198* 118*  BUN 33* 35*  CREATININE 8.04* 9.50*  ALB -- --  CALCIUM 10.4 --  PHOS 2.6 --   CBC  Lab 08/05/11 0530 08/04/11 0526 08/04/11 0520  WBC 8.0 -- 7.1  NEUTROABS -- -- 4.9  HGB 11.2* 11.6* 11.0*  HCT 33.2* 34.0* 33.3*  MCV 96.5 -- 97.1  PLT 116* -- 109*    Medications:      . albumin human  12.5 g Intravenous Once  . ipratropium  0.5 mg Nebulization Q6H   And  . albuterol  2.5 mg Nebulization Q6H  . antiseptic oral rinse  15 mL Mouth Rinse BID  . aspirin EC  81 mg Oral Daily  . calcium acetate  2,668 mg Oral TID WC  . doxazosin  2 mg Oral BID  . heparin  5,000 Units Subcutaneous Q8H  . multivitamin  1 tablet Oral Daily  . predniSONE  50 mg Oral Q breakfast  . simvastatin  20 mg Oral q1800  . sodium chloride  3 mL Intravenous Q12H  . vitamin C  500 mg Oral Daily  . zolpidem  5 mg Oral QHS  . DISCONTD: aspirin EC  81 mg Oral Daily  . DISCONTD: calcium acetate  2,001 mg Oral TID WC  . DISCONTD: doxazosin  2 mg Oral Q12H  . DISCONTD: methylPREDNISolone (SOLU-MEDROL) injection  60 mg Intravenous Q6H  . DISCONTD: multivitamin  1 tablet Oral Daily     Assessment/ Plan:   1. Respiratory distress with pulmonary edema/OSA/ COPD excaerbation: Some improvement noted in respiratory status after HD/UF but still had  some dyspnea requiring supplemental O2 via [place] and CPAP.  2. ESRD: Urgent HD done yesterday- no acute HD needs noted for today- plan scheduled HD tuesday.   3. Anemia: Hgb currently at goal off ESA- monitor trend  4. Metabolic bone disease: Restarted Phoslo with meals TIDAC. Not on VDRA- Last PTH 83.  5. Hypertension: Anticipate to improve with restarted doxazosin and amlodipine    Elmarie Shiley, MD 08/05/2011, 9:36 AM

## 2011-08-05 NOTE — Progress Notes (Signed)
Patient request to go down to second floor with her wife and daughter per wheelchair. Dr. Judeth Porch notified, is okay for him to go. Refused to use oxygen despite explanation.Claimed he is fine and can go without it.

## 2011-08-06 LAB — URINE CULTURE: Colony Count: 6000

## 2011-08-06 LAB — CBC
MCHC: 33.4 g/dL (ref 30.0–36.0)
RDW: 12.8 % (ref 11.5–15.5)

## 2011-08-06 LAB — BASIC METABOLIC PANEL
BUN: 69 mg/dL — ABNORMAL HIGH (ref 6–23)
Creatinine, Ser: 11.37 mg/dL — ABNORMAL HIGH (ref 0.50–1.35)
GFR calc Af Amer: 5 mL/min — ABNORMAL LOW (ref >90)
GFR calc non Af Amer: 5 mL/min — ABNORMAL LOW (ref >90)
Potassium: 4.3 mEq/L (ref 3.5–5.1)

## 2011-08-06 MED ORDER — HYDRALAZINE HCL 25 MG PO TABS
25.0000 mg | ORAL_TABLET | Freq: Three times a day (TID) | ORAL | Status: DC
  Administered 2011-08-06 (×2): 25 mg via ORAL
  Filled 2011-08-06 (×9): qty 1

## 2011-08-06 MED ORDER — DOXAZOSIN MESYLATE 1 MG PO TABS
1.0000 mg | ORAL_TABLET | Freq: Two times a day (BID) | ORAL | Status: DC
  Administered 2011-08-06 – 2011-08-07 (×2): 1 mg via ORAL
  Filled 2011-08-06 (×6): qty 1

## 2011-08-06 MED ORDER — ALBUTEROL SULFATE (5 MG/ML) 0.5% IN NEBU
2.5000 mg | INHALATION_SOLUTION | Freq: Four times a day (QID) | RESPIRATORY_TRACT | Status: DC
  Administered 2011-08-06 – 2011-08-09 (×10): 2.5 mg via RESPIRATORY_TRACT
  Filled 2011-08-06 (×12): qty 0.5

## 2011-08-06 MED ORDER — IPRATROPIUM BROMIDE 0.02 % IN SOLN: 0.5000 mg | Freq: Three times a day (TID) | RESPIRATORY_TRACT | Status: DC

## 2011-08-06 MED ORDER — ALBUTEROL SULFATE (5 MG/ML) 0.5% IN NEBU: 2.5000 mg | INHALATION_SOLUTION | RESPIRATORY_TRACT | Status: DC | PRN

## 2011-08-06 MED ORDER — LISINOPRIL 10 MG PO TABS
10.0000 mg | ORAL_TABLET | Freq: Every day | ORAL | Status: DC
  Administered 2011-08-06 – 2011-08-07 (×2): 10 mg via ORAL
  Filled 2011-08-06 (×4): qty 1

## 2011-08-06 MED ORDER — ALBUTEROL SULFATE (5 MG/ML) 0.5% IN NEBU: 2.5000 mg | INHALATION_SOLUTION | Freq: Three times a day (TID) | RESPIRATORY_TRACT | Status: DC

## 2011-08-06 MED ORDER — IPRATROPIUM BROMIDE 0.02 % IN SOLN
0.5000 mg | Freq: Four times a day (QID) | RESPIRATORY_TRACT | Status: DC
  Administered 2011-08-06 – 2011-08-09 (×10): 0.5 mg via RESPIRATORY_TRACT
  Filled 2011-08-06 (×12): qty 2.5

## 2011-08-06 MED ORDER — PANTOPRAZOLE SODIUM 40 MG PO TBEC
40.0000 mg | DELAYED_RELEASE_TABLET | Freq: Two times a day (BID) | ORAL | Status: DC
  Administered 2011-08-06 – 2011-08-08 (×4): 40 mg via ORAL
  Filled 2011-08-06 (×4): qty 1

## 2011-08-06 NOTE — Progress Notes (Signed)
Subjective:  Sitting up side of bed; family @ bedside; slight DOE; s/p neb treatment with wheezing and prod cough (scant yellow expectorant); discussed possibility extra HD off schedule to facilitate fluid removal and improved resp status, but denies need for extra.   Vital signs in last 24 hours: Filed Vitals:   08/06/11 0155 08/06/11 0159 08/06/11 0458 08/06/11 0901  BP:  175/85 153/80   Pulse:   91   Temp:   97.6 F (36.4 C)   TempSrc:   Oral   Resp:   18   Height:      Weight:      SpO2: 99%  91% 91%   Weight change: -5.705 kg (-12 lb 9.3 oz)  Intake/Output Summary (Last 24 hours) at 08/06/11 0935 Last data filed at 08/05/11 2200  Gross per 24 hour  Intake    480 ml  Output      0 ml  Net    480 ml   Labs: Basic Metabolic Panel:  Lab [id number] 0505 08/05/11 0530 08/04/11 0526  NA 136 136 142  K 4.3 4.1 4.2  CL 90* 93* 110  CO2 24 26 --  GLUCOSE 143* 198* 118*  BUN 69* 33* 35*  CREATININE 11.37* 8.04* 9.50*  CALCIUM 11.4* 10.4 --  ALB -- -- --  PHOS -- 2.6 --   Liver Function Tests: No results found for this basename: AST:3,ALT:3,ALKPHOS:3,BILITOT:3,PROT:3,ALBUMIN:3 in the last 168 hours No results found for this basename: LIPASE:3,AMYLASE:3 in the last 168 hours No results found for this basename: AMMONIA:3 in the last 168 hours CBC:  Lab 08/06/11 0505 08/05/11 0530 08/04/11 0526 08/04/11 0520  WBC 11.6* 8.0 -- 7.1  NEUTROABS -- -- -- 4.9  HGB 11.1* 11.2* 11.6* --  HCT 33.2* 33.2* 34.0* --  MCV 94.9 96.5 -- 97.1  PLT 121* 116* -- 109*   Cardiac Enzymes: No results found for this basename: CKTOTAL:5,CKMB:5,CKMBINDEX:5,TROPONINI:5 in the last 168 hours CBG: No results found for this basename: GLUCAP:5 in the last 168 hours  Iron Studies: No results found for this basename: IRON,TIBC,TRANSFERRIN,FERRITIN in the last 72 hours Studies/Results: Portable Chest Xray In Am  08/05/2011  *RADIOLOGY REPORT*  Clinical Data: Shortness of breath.  PORTABLE CHEST - 1  VIEW  Comparison: 1 day prior  Findings: Numerous leads and wires project over the chest.  Normal heart size.  Mild left hemidiaphragm elevation. No pleural effusion or pneumothorax.  Patchy left base air space disease is new or increased.  The right lung is clear.  IMPRESSION: Developing left base air space disease.  Either atelectasis or early infection.  Consider short-term radiographic follow-up.  Original Report Authenticated By: Areta Haber, M.D.   Dg Chest Port 1 View  08/04/2011  *RADIOLOGY REPORT*  Clinical Data: Chest pain.  Shortness of breath. Evaluate infiltrate.  PORTABLE CHEST - 1 VIEW  Comparison: 08/04/2011, 10/26/2010 and 06/22/2008.  Findings: Heart slightly enlarged.  This appearance may be related to AP magnification.  Tortuous aorta.  Central pulmonary vascular prominence.  No infiltrate, congestive heart failure or pneumothorax.  The patient would eventually benefit from follow-up two-view chest with cardiac leads removed.  IMPRESSION: Central pulmonary vascular prominence without pulmonary edema.  No segmental infiltrate.  Slightly tortuous aorta.  Heart appears prominent which may be related to AP magnification. Please see above.  Original Report Authenticated By: Doug Sou, M.D.   Medications:      . ipratropium  0.5 mg Nebulization TID   And  . albuterol  2.5 mg Nebulization TID  . aspirin EC  81 mg Oral Daily  . calcium acetate  2,668 mg Oral TID WC  . doxazosin  2 mg Oral BID  . heparin  5,000 Units Subcutaneous Q8H  . methylPREDNISolone (SOLU-MEDROL) injection  60 mg Intravenous Once  . multivitamin  1 tablet Oral Daily  . predniSONE  50 mg Oral Q breakfast  . simvastatin  20 mg Oral q1800  . sodium chloride  3 mL Intravenous Q12H  . vitamin C  500 mg Oral Daily  . zolpidem  5 mg Oral QHS  . DISCONTD: albuterol  2.5 mg Nebulization Q6H  . DISCONTD: antiseptic oral rinse  15 mL Mouth Rinse BID  . DISCONTD: ipratropium  0.5 mg Nebulization Q6H    I   have reviewed scheduled and prn medications.  Physical Exam: General: comfortable Heart: RRR Lungs: coarse w/inspiratory wheezing; cleared Abdomen: obese, soft, NT/ND, +BS Extremities: trace BLE edema Dialysis Access: Left radialcephalic AVF, +T/B  Dialysis prescription: 4 1/4hr, EDW 99.5Kg, 3K/2.25Ca, 15G Needles- LFA AVF (buttonhole).  Heparin 8000units TIW, Epogen 1200units TIW and Infed 50mg  Q WEEK.   Problem/Plan: 1. Acute resp failure due to vol excess/pulm edema plus COPD exacerbation- s/p removal 5.5L fluid with HD, f/u CXR showed resolution of edema/vasc congestion. Still wheezing, pulm treating for COPD exacerbation which is a new diagnosis for him. +heavy smoker. Given IV Solumedrol and now po pred 50/d. 2. ESRD -TTS NW; plans HD tomorrow. Was 5-13 kg over dry weight on admission, now just below at 98.2kg yest. 3. Anemia - Hgb trend down, now 11.1, but remains @ goal; continue low dose ESA ; follow  4. Secondary hyperparathyroidism - on Phoslo with calcium ^11.4 this am; no VDRA with iPTH 83 most recent labs outpt setting; Hold binders (phoslo 4ac tid) with most recent phos 2.6; change to 2.0 Ca bath tomorrow; Renal Panel in am (pre-HD); follow ca/phos 5. HTN/volume - Hydralazine prn given per staff; only on Cardura 2mg  BID; Norvasc recently d/c'd (10mg  qhs) due to ankle edema; will lower Cardura and start lisinopril which he has taken before without difficulty per wife.  6. Nutrition - ^ protein diet; albumin with renal panel am 7. Disposition- per primary services  Marni Griffon, FNP-C [facility] Pager [phone]  08/06/2011,9:35 AM  LOS: 2 days   Patient seen and examined and agree with assessment and plan as above with additions as indicated.  Kelly Splinter  MD [facility] [phone] pgr    [phone] cell 08/06/2011, 12:39 PM

## 2011-08-06 NOTE — Progress Notes (Signed)
Name: Kirk [name] MRN: [id number] DOB: 12/31/1960    LOS: 2  PCCM Progress NOTE  History of Present Illness: 51 year old Caucasian man with a history of ESRD on a Tuesday/Thursday/Saturday who presented to [facility] with acute worsening of shortness of breath.  He went to his routine dialysis on Thursday but cut treatment short after 3 1/2hr (of prescribed 4 1/4hrs) as he did not feel well. CXR on presentation with B interstitial pattern. Was seen by Dr Posey Pronto and transferred immediately for HD [facility]. Was placed temporarily on BiPAP with stabilization   Lines / Drains:  Cultures: Blood 7/13 >> Urine 7/13 >>negative  Antibiotics:  Tests / Events: CXR 7/13 >> B interstitial prominence most consistent with pulm edema, low likelihood atypical infxn CXR 7/14 >> significant improvement B infiltrates  Subjective:  Still gets dizzy and winded with activity.  Has some improvement in breathing.  Vital Signs: Temp:  [97.3 F (36.3 C)-98.2 F (36.8 C)] 97.3 F (36.3 C) (07/15 0954) Pulse Rate:  [89-104] 89  (07/15 0954) Resp:  [18-20] 18  (07/15 0954) BP: (153-203)/(77-85) 177/84 mmHg (07/15 0954) SpO2:  [90 %-99 %] 95 % (07/15 0954) Weight:  [216 lb 7.9 oz (98.2 kg)-216 lb 11.2 oz (98.294 kg)] 216 lb 7.9 oz (98.2 kg) (07/14 2157) I/O last 3 completed shifts: In: 37 [P.O.:1470; I.V.:10] Out: 50 [Urine:50]  Intake/Output Summary (Last 24 hours) at 08/06/11 1130 Last data filed at 08/05/11 2200  Gross per 24 hour  Intake    480 ml  Output      0 ml  Net    480 ml    Physical Examination: General: comfortable, placed back on [place] [facility]:  Awake and alert, moves all ext   HEENT:  OP clear Neck:  No LAD, no stridor   Cardiovascular:  Tachy, regular Lungs:  Distant, B exp wheezes with VCD component Abdomen:  Obese, soft, NT Musculoskeletal:  Trace B LE edema Skin:  No rash      Portable Chest Xray In Am  08/05/2011  *RADIOLOGY REPORT*  Clinical Data: Shortness of breath.   PORTABLE CHEST - 1 VIEW  Comparison: 1 day prior  Findings: Numerous leads and wires project over the chest.  Normal heart size.  Mild left hemidiaphragm elevation. No pleural effusion or pneumothorax.  Patchy left base air space disease is new or increased.  The right lung is clear.  IMPRESSION: Developing left base air space disease.  Either atelectasis or early infection.  Consider short-term radiographic follow-up.  Original Report Authenticated By: Areta Haber, M.D.   Dg Chest Port 1 View  08/04/2011  *RADIOLOGY REPORT*  Clinical Data: Chest pain.  Shortness of breath. Evaluate infiltrate.  PORTABLE CHEST - 1 VIEW  Comparison: 08/04/2011, 10/26/2010 and 06/22/2008.  Findings: Heart slightly enlarged.  This appearance may be related to AP magnification.  Tortuous aorta.  Central pulmonary vascular prominence.  No infiltrate, congestive heart failure or pneumothorax.  The patient would eventually benefit from follow-up two-view chest with cardiac leads removed.  IMPRESSION: Central pulmonary vascular prominence without pulmonary edema.  No segmental infiltrate.  Slightly tortuous aorta.  Heart appears prominent which may be related to AP magnification. Please see above.  Original Report Authenticated By: Doug Sou, M.D.    Lab 08/06/11 0505 08/05/11 0530 08/04/11 0526  NA 136 136 142  K 4.3 4.1 4.2  CL 90* 93* 110  CO2 24 26 --  BUN 69* 33* 35*  CREATININE 11.37* 8.04*  9.50*  GLUCOSE 143* 198* 118*    Lab 08/06/11 0505 08/05/11 0530 08/04/11 0526 08/04/11 0520  HGB 11.1* 11.2* 11.6* --  HCT 33.2* 33.2* 34.0* --  WBC 11.6* 8.0 -- 7.1  PLT 121* 116* -- 109*      Assessment and Plan: PULMONARY A: Acute Resp failure, likely due to volume excess superimposed on OSA. Hx tobacco/COPD. Continued wheeze after volume removal consistent with a concomitant AE-COPD P:  - HD and vol removal per renal plans - scheduled BD's now and probably for discharge - continue pred and treat for COPD  exacerbation - CPAP qhs - likely ATX at left base on CXR 7/14>>will f/u CXR 7/16, but hold ABX for now  CARDIOLOGY A: HTN P:  - stable on current regimen  RENAL  Lab 08/06/11 0505 08/05/11 0530 08/04/11 0526  NA 136 136 142  K 4.3 4.1 --  CL 90* 93* 110  CO2 24 26 --  GLUCOSE 143* 198* 118*  BUN 69* 33* 35*  CREATININE 11.37* 8.04* 9.50*  CALCIUM 11.4* 10.4 --  MG -- 2.3 --  PHOS -- 2.6 --   A: ESRD with acute volume overload P:  - HD per renal  GASTROINTESTINAL A: Apparent gastroenteritis prior to admission, ? Viral syndrome>>improved P: - PO diet as he can tolerate  INFECTIOUS: A: no current evidence for bacterial infection P: - empiric cultures, no abx at this time but low threshold to start if declines  HEMATOLOGY: A: Anemia of chronic disease P: - follow CBC  ENDOCRINE: A: No active issues.   NEURO:  A: no acute issues   Best practices / Disposition:  -->Heparin for DVT Px -->Gi prophylaxis Protonix -->diet >> renal   Richardson Landry Minor ACNP [facility] Pager [phone] till 3 pm If no answer page [phone] 08/06/2011, 11:30 AM  Reviewed above, examined pt, and agree with assessment/plan.  [name], MD [facility] Pulmonary/Critical Care 08/06/2011, 2:48 PM Pager:  [phone] After 3pm call: [phone]

## 2011-08-06 NOTE — Progress Notes (Signed)
Utilization review completed.  

## 2011-08-07 ENCOUNTER — Inpatient Hospital Stay (HOSPITAL_COMMUNITY): Payer: BC Managed Care – PPO

## 2011-08-07 LAB — RENAL FUNCTION PANEL
Albumin: 3.5 g/dL (ref 3.5–5.2)
BUN: 99 mg/dL — ABNORMAL HIGH (ref 6–23)
Creatinine, Ser: 14.07 mg/dL — ABNORMAL HIGH (ref 0.50–1.35)
Phosphorus: 6.6 mg/dL — ABNORMAL HIGH (ref 2.3–4.6)

## 2011-08-07 LAB — BLOOD GAS, ARTERIAL
Acid-Base Excess: 3 mmol/L — ABNORMAL HIGH (ref 0.0–2.0)
Bicarbonate: 26.5 mEq/L — ABNORMAL HIGH (ref 20.0–24.0)
O2 Saturation: 96.6 %
TCO2: 27.6 mmol/L (ref 0–100)
pO2, Arterial: 76.3 mmHg — ABNORMAL LOW (ref 80.0–100.0)

## 2011-08-07 LAB — CBC
HCT: 30.6 % — ABNORMAL LOW (ref 39.0–52.0)
MCHC: 34.3 g/dL (ref 30.0–36.0)
MCV: 94.2 fL (ref 78.0–100.0)
RDW: 12.6 % (ref 11.5–15.5)

## 2011-08-07 MED ORDER — MOXIFLOXACIN HCL 400 MG PO TABS
400.0000 mg | ORAL_TABLET | Freq: Every day | ORAL | Status: DC
  Administered 2011-08-08: 400 mg via ORAL
  Filled 2011-08-07 (×3): qty 1

## 2011-08-07 NOTE — Progress Notes (Signed)
Subjective:  Still with DOE; denies c/p; ready for HD now; wife @ bedside  Vital signs in last 24 hours: Filed Vitals:   08/06/11 2051 08/07/11 0100 08/07/11 0200 08/07/11 0412  BP: 167/77   141/70  Pulse: 87 88  80  Temp: 97.8 F (36.6 C)   97.7 F (36.5 C)  TempSrc: Oral   Oral  Resp: 22 20  21   Height: 5' 7.5" (1.715 m)     Weight: 101.4 kg (223 lb 8.7 oz)     SpO2:  94% 94% 96%   Weight change: 3.105 kg (6 lb 13.5 oz)  Intake/Output Summary (Last 24 hours) at 08/07/11 0757 Last data filed at 08/06/11 2138  Gross per 24 hour  Intake    480 ml  Output      0 ml  Net    480 ml   Labs: Basic Metabolic Panel:  Lab [id number] 0505 08/05/11 0530 08/04/11 0526  NA 136 136 142  K 4.3 4.1 4.2  CL 90* 93* 110  CO2 24 26 --  GLUCOSE 143* 198* 118*  BUN 69* 33* 35*  CREATININE 11.37* 8.04* 9.50*  CALCIUM 11.4* 10.4 --  ALB -- -- --  PHOS -- 2.6 --   Liver Function Tests: No results found for this basename: AST:3,ALT:3,ALKPHOS:3,BILITOT:3,PROT:3,ALBUMIN:3 in the last 168 hours No results found for this basename: LIPASE:3,AMYLASE:3 in the last 168 hours No results found for this basename: AMMONIA:3 in the last 168 hours CBC:  Lab 08/06/11 0505 08/05/11 0530 08/04/11 0526 08/04/11 0520  WBC 11.6* 8.0 -- 7.1  NEUTROABS -- -- -- 4.9  HGB 11.1* 11.2* 11.6* --  HCT 33.2* 33.2* 34.0* --  MCV 94.9 96.5 -- 97.1  PLT 121* 116* -- 109*   Medications:      . albuterol  2.5 mg Nebulization Q6H  . aspirin EC  81 mg Oral Daily  . doxazosin  1 mg Oral BID  . heparin  5,000 Units Subcutaneous Q8H  . hydrALAZINE  25 mg Oral Q8H  . ipratropium  0.5 mg Nebulization Q6H  . lisinopril  10 mg Oral QHS  . multivitamin  1 tablet Oral Daily  . pantoprazole  40 mg Oral BID AC  . predniSONE  50 mg Oral Q breakfast  . simvastatin  20 mg Oral q1800  . sodium chloride  3 mL Intravenous Q12H  . vitamin C  500 mg Oral Daily  . DISCONTD: albuterol  2.5 mg Nebulization Q6H  . DISCONTD:  albuterol  2.5 mg Nebulization TID  . DISCONTD: calcium acetate  2,668 mg Oral TID WC  . DISCONTD: doxazosin  2 mg Oral BID  . DISCONTD: ipratropium  0.5 mg Nebulization Q6H  . DISCONTD: ipratropium  0.5 mg Nebulization TID  . DISCONTD: zolpidem  5 mg Oral QHS    I  have reviewed scheduled and prn medications.  Physical Exam:  General: comfortable  Heart: RRR  Lungs: coarse w/inspiratory wheezing; cleared  Abdomen: obese, soft, NT/ND, +BS  Extremities: trace BLE edema  Dialysis Access: Left radialcephalic AVF, +T/B   Dialysis prescription: 4 1/4hr, EDW 99.5Kg, 3K/2.25Ca, 15G Needles- LFA AVF (buttonhole).  Heparin 8000units TIW, Epogen 1200units TIW and Infed 50mg  Q WEEK.   Problem/Plan:  1. Acute resp failure due to vol excess/pulm edema plus COPD exacerbation- f/u CXR showed resolution of edema/vasc congestion. Still wheezing, pulm treating for COPD exacerbation which is a new diagnosis for him. +heavy smoker. Given IV Solumedrol and now po pred 50/d; defer  to primary; cont attempt max fluid removal with HD 2. ESRD -TTS NW; HD today; wt 104 on admit, lowest 98.2, dry wt 99.5. Possibly new eDW at time of discharge 3. Anemia - Hgb 11.1, on low dose ESA; follow  4. Secondary hyperparathyroidism - ^ ca 11.4 yesterday, phoslo d/c'd; 2.0 ca bath; repeat labs pending  5. HTN/volume - improved on lower dose cardura and lisinopril; UF with HD and follow 6. Nutrition - ^ protein diet; albumin with renal panel am 7. Disposition- per primary services   Marni Griffon, FNP-C [facility] Pager [phone]  08/07/2011,7:57 AM  LOS: 3 days   Patient seen and examined and agree with assessment and plan as above.  Kelly Splinter  MD [facility] [phone] pgr    [phone] cell 08/07/2011, 12:47 PM

## 2011-08-07 NOTE — Procedures (Signed)
I was present at this dialysis session. I have reviewed the session itself and made appropriate changes.   Kelly Splinter, MD [employer] 08/07/2011, 12:49 PM

## 2011-08-07 NOTE — Progress Notes (Signed)
Name: Kirk [name] MRN: [id number] DOB: 06/06/1960    LOS: 3  PCCM Progress NOTE  History of Present Illness: 51 year old Caucasian man with a history of ESRD on a Tuesday/Thursday/Saturday who presented to [facility] with acute worsening of shortness of breath.  He went to his routine dialysis on Thursday but cut treatment short after 3 1/2hr (of prescribed 4 1/4hrs) as he did not feel well. CXR on presentation with B interstitial pattern. Was seen by Dr Posey Pronto and transferred immediately for HD at [facility]. Was placed temporarily on BiPAP with stabilization   Lines / Drains:  Cultures: Blood 7/13 >> Urine 7/13 >>negative  Antibiotics:  Tests / Events: CXR 7/13 >> B interstitial prominence most consistent with pulm edema, low likelihood atypical infxn CXR 7/14 >> significant improvement B infiltrates  Subjective:  Still gets dizzy and winded with activity.  Has some improvement in breathing.  Vital Signs: Temp:  [97.6 F (36.4 C)-97.8 F (36.6 C)] 97.6 F (36.4 C) (07/16 0810) Pulse Rate:  [75-92] 86  (07/16 0930) Resp:  [20-22] 22  (07/16 0810) BP: (111-183)/(62-80) 111/64 mmHg (07/16 0930) SpO2:  [90 %-98 %] 97 % (07/16 1000) Weight:  [223 lb 8.7 oz (101.4 kg)-224 lb 6.9 oz (101.8 kg)] 224 lb 6.9 oz (101.8 kg) (07/16 0810) I/O last 3 completed shifts: In: 720 [P.O.:720] Out: -   Intake/Output Summary (Last 24 hours) at 08/07/11 1007 Last data filed at 08/07/11 0900  Gross per 24 hour  Intake    600 ml  Output      0 ml  Net    600 ml    Physical Examination: General: comfortable, placed back on [place] [facility]:  Awake and alert, moves all ext   HEENT:  OP clear Neck:  No LAD, no stridor   Cardiovascular:  Tachy, regular Lungs:  Distant, B exp wheezes with VCD component Abdomen:  Obese, soft, NT Musculoskeletal:  Trace B LE edema Skin:  No rash      Dg Chest 2 View  08/07/2011  *RADIOLOGY REPORT*  Clinical Data: Follow up left basilar atelectasis, history hypertension  and tobacco use  CHEST - 2 VIEW  Comparison: 08/05/2011  Findings: The cardiac shadow is stable.  Pulmonary vascularity is within normal limits.  The lungs are well-aerated bilaterally. Very minimal left basilar atelectasis remains.  No new focal abnormality is seen.  IMPRESSION: Stable left basilar atelectasis.  No new focal abnormality is noted.  Original Report Authenticated By: Everlene Farrier, M.D.    Lab 08/06/11 0505 08/05/11 0530 08/04/11 0526  NA 136 136 142  K 4.3 4.1 4.2  CL 90* 93* 110  CO2 24 26 --  BUN 69* 33* 35*  CREATININE 11.37* 8.04* 9.50*  GLUCOSE 143* 198* 118*    Lab 08/07/11 0942 08/06/11 0505 08/05/11 0530  HGB 10.5* 11.1* 11.2*  HCT 30.6* 33.2* 33.2*  WBC 7.4 11.6* 8.0  PLT 113* 121* 116*      Assessment and Plan: PULMONARY A: Acute Resp failure, likely due to volume excess superimposed on OSA. Hx tobacco/COPD. Continued wheeze after volume removal consistent with a concomitant AE-COPD P:  - HD and vol removal per renal plans - scheduled BD's now and probably for discharge - continue pred and treat for COPD exacerbation - CPAP qhs - add abx 7/16 for AECOPD 7-16 [place] in c x r. Continues to use O2 due to DOE. He is to pull negative on HD 7/16 and will revaluate O2 needs. He  may need 2 d to evaluate cardiac function. Check abg on room air 7-16  CARDIOLOGY A: HTN with borderline BP P:  - adjust meds per renal  RENAL  Lab 08/06/11 0505 08/05/11 0530 08/04/11 0526  NA 136 136 142  K 4.3 4.1 --  CL 90* 93* 110  CO2 24 26 --  GLUCOSE 143* 198* 118*  BUN 69* 33* 35*  CREATININE 11.37* 8.04* 9.50*  CALCIUM 11.4* 10.4 --  MG -- 2.3 --  PHOS -- 2.6 --   A: ESRD with acute volume overload P:  - HD per renal  GASTROINTESTINAL A: Apparent gastroenteritis prior to admission, ? Viral syndrome>>improved P: - PO diet as he can tolerate  INFECTIOUS: A: AECOPD P: - start avelox 7/16  HEMATOLOGY: A: Anemia of chronic disease P: - follow  CBC  ENDOCRINE: A: No active issues.   NEURO:  A: no acute issues   Best practices / Disposition:  -->Heparin for DVT Px -->Gi prophylaxis Protonix -->diet >> renal   Richardson Landry Minor ACNP [facility] Pager [phone] till 3 pm If no answer page [phone] 08/07/2011, 10:07 AM  Reviewed above, and examined pt.  He has dizziness and relatively low BP after HD this morning.  Still has cough, sputum, and wheeze.  Add Abx for AECOPD.  Defer to renal to adjust BP meds.  Updated family at bedside.  [name], MD [facility] Pulmonary/Critical Care 08/07/2011, 2:25 PM Pager:  [phone] After 3pm call: [phone]

## 2011-08-08 DIAGNOSIS — I517 Cardiomegaly: Secondary | ICD-10-CM

## 2011-08-08 MED ORDER — DOXAZOSIN MESYLATE 2 MG PO TABS
2.0000 mg | ORAL_TABLET | Freq: Two times a day (BID) | ORAL | Status: DC
  Administered 2011-08-08 (×2): 2 mg via ORAL
  Filled 2011-08-08 (×4): qty 1

## 2011-08-08 MED ORDER — HEPARIN SODIUM (PORCINE) 1000 UNIT/ML DIALYSIS
8000.0000 [IU] | INTRAMUSCULAR | Status: DC | PRN
  Administered 2011-08-09: 8000 [IU] via INTRAVENOUS_CENTRAL
  Filled 2011-08-08: qty 8

## 2011-08-08 NOTE — Progress Notes (Signed)
Name: Kirk [name] MRN: [id number] DOB: [birth year]    LOS: 4  PCCM Progress NOTE  History of Present Illness: 51 year old Caucasian man with a history of ESRD on a Tuesday/Thursday/Saturday who presented to [facility] with acute worsening of shortness of breath.  He went to his routine dialysis on Thursday but cut treatment short after 3 1/2hr (of prescribed 4 1/4hrs) as he did not feel well. CXR on presentation with B interstitial pattern. Was seen by Dr Posey Pronto and transferred immediately for HD at [facility]. Was placed temporarily on BiPAP with stabilization   Lines / Drains:  Cultures: Blood 7/13 >> Urine 7/13 >>negative  Antibiotics: 7/16 avelox>> Tests / Events: CXR 7/13 >> B interstitial prominence most consistent with pulm edema, low likelihood atypical infxn CXR 7/14 >> significant improvement B infiltrates  Subjective:  Still gets dizzy and winded with activity.  Still requires O2  Vital Signs: Temp:  [97.4 F (36.3 C)-98.4 F (36.9 C)] 98.2 F (36.8 C) (07/17 0520) Pulse Rate:  [83-102] 86  (07/17 0520) Resp:  [18-22] 18  (07/17 0520) BP: (93-136)/(50-68) 111/65 mmHg (07/17 0520) SpO2:  [86 %-98 %] 93 % (07/17 0832) FiO2 (%):  [96 %-97 %] 96 % (07/16 1934) Weight:  [210 lb 1.6 oz (95.3 kg)-215 lb (97.523 kg)] 215 lb (97.523 kg) (07/16 2054) I/O last 3 completed shifts: In: 1320 [P.O.:1320] Out: 4195 [Other:4195]  Intake/Output Summary (Last 24 hours) at 08/08/11 0934 Last data filed at 08/08/11 0243  Gross per 24 hour  Intake    960 ml  Output   4195 ml  Net  -3235 ml    Physical Examination: General: comfortable, placed back on [place]. Co of SOB Neuro:  Awake and alert, moves all ext   HEENT:  OP clear Neck:  No LAD, no stridor   Cardiovascular:  Tachy, regular Lungs:  Distant, wheezes gone Abdomen:  Obese, soft, NT Musculoskeletal:  Trace B LE edema Skin:  No rash   Vent Mode:  [-]  FiO2 (%):  [96 %-97 %] 96 %  Dg Chest 2 View  08/07/2011  *RADIOLOGY  REPORT*  Clinical Data: Follow up left basilar atelectasis, history hypertension and tobacco use  CHEST - 2 VIEW  Comparison: 08/05/2011  Findings: The cardiac shadow is stable.  Pulmonary vascularity is within normal limits.  The lungs are well-aerated bilaterally. Very minimal left basilar atelectasis remains.  No new focal abnormality is seen.  IMPRESSION: Stable left basilar atelectasis.  No new focal abnormality is noted.  Original Report Authenticated By: Everlene Farrier, M.D.    Lab 08/07/11 0943 08/06/11 0505 08/05/11 0530  NA 137 136 136  K 3.6 4.3 4.1  CL 90* 90* 93*  CO2 22 24 26   BUN 99* 69* 33*  CREATININE 14.07* 11.37* 8.04*  GLUCOSE 133* 143* 198*    Lab 08/07/11 0942 08/06/11 0505 08/05/11 0530  HGB 10.5* 11.1* 11.2*  HCT 30.6* 33.2* 33.2*  WBC 7.4 11.6* 8.0  PLT 113* 121* 116*      Assessment and Plan: PULMONARY A: Acute Resp failure, likely due to volume excess superimposed on OSA. Hx tobacco/COPD. Continued wheeze after volume removal consistent with a concomitant AE-COPD P:  - HD and vol removal per renal plans - scheduled BD's now and probably for discharge - continue pred and treat for COPD exacerbation - CPAP qhs - add abx 7/16 for AECOPD 7-16 [place] in c x r. Continues to use O2 due to DOE. He is to pull  negative on HD 7/16 and will revaluate O2 needs. He may need 2 d to evaluate cardiac function. Check abg on room air 7-16  ABG    Component Value Date/Time   PHART 7.476* 08/07/2011 1500   PCO2ART 36.2 08/07/2011 1500   PO2ART 76.3* 08/07/2011 1500   HCO3 26.5* 08/07/2011 1500   TCO2 27.6 08/07/2011 1500   O2SAT 96.6 08/07/2011 1500  Ambulated 7/17 on room air with O2 sats lowest 91%  CARDIOLOGY A: HTN with borderline BP P:  - adjust meds per renal -2 d 7/17>>> RENAL  Lab 08/07/11 0943 08/06/11 0505 08/05/11 0530 08/04/11 0526  NA 137 136 136 142  K 3.6 4.3 -- --  CL 90* 90* 93* 110  CO2 22 24 26  --  GLUCOSE 133* 143* 198* 118*  BUN 99* 69* 33*  35*  CREATININE 14.07* 11.37* 8.04* 9.50*  CALCIUM 10.5 11.4* 10.4 --  MG -- -- 2.3 --  PHOS 6.6* -- 2.6 --   A: ESRD with acute volume overload P:  - HD per renal  GASTROINTESTINAL A: Apparent gastroenteritis prior to admission, ? Viral syndrome>>improved P: - PO diet as he can tolerate  INFECTIOUS: A: AECOPD P: - started avelox 7/16  HEMATOLOGY: A: Anemia of chronic disease P: - follow CBC  ENDOCRINE: A: No active issues.   NEURO:  A: no acute issues   Best practices / Disposition:  -->Heparin for DVT Px -->Gi prophylaxis Protonix -->diet >> renal Updated family at bedside.  Richardson Landry Minor ACNP [facility] Pager [phone] till 3 pm If no answer page [phone] 08/08/2011, 9:34 AM  Reviewed above, examined pt, and agree with assessment/plan.  Likely d/c home 7/18.  [name], MD [facility] Pulmonary/Critical Care 08/08/2011, 1:54 PM Pager:  [phone] After 3pm call: [phone]

## 2011-08-08 NOTE — Progress Notes (Signed)
Pt ambulated 200 ft in the hall on room air. The lowest his O2 sat got to was 91%. When he returned to his room, his O2 sat was 94%. Pt seemed very worn out during the walk, having to stop numerous times to brace himself, but not short of breath.

## 2011-08-08 NOTE — Progress Notes (Signed)
Subjective: Feeling better, walked in halls. BP dropped into 80's 90's last night.    Objective Vital signs in last 24 hours: Filed Vitals:   08/08/11 0520 08/08/11 0536 08/08/11 0832 08/08/11 1000  BP: 111/65   126/68  Pulse: 86   94  Temp: 98.2 F (36.8 C)   97.3 F (36.3 C)  TempSrc: Oral   Oral  Resp: 18   18  Height:      Weight:      SpO2: 86% 98% 93% 97%   Weight change: 0.4 kg (14.1 oz)  Intake/Output Summary (Last 24 hours) at 08/08/11 1156 Last data filed at 08/08/11 0900  Gross per 24 hour  Intake   1200 ml  Output   4195 ml  Net  -2995 ml   Labs: Basic Metabolic Panel:  Lab [id number] 08/06/11 0505 08/05/11 0530 08/04/11 0526  NA 137 136 136 142  K 3.6 4.3 4.1 4.2  CL 90* 90* 93* 110  CO2 22 24 26  --  GLUCOSE 133* 143* 198* 118*  BUN 99* 69* 33* 35*  CREATININE 14.07* 11.37* 8.04* 9.50*  ALB -- -- -- --  CALCIUM 10.5 11.4* 10.4 --  PHOS 6.6* -- 2.6 --   Liver Function Tests:  Lab 08/07/11 0943  AST --  ALT --  ALKPHOS --  BILITOT --  PROT --  ALBUMIN 3.5   No results found for this basename: LIPASE:3,AMYLASE:3 in the last 168 hours No results found for this basename: AMMONIA:3 in the last 168 hours CBC:  Lab 08/07/11 0942 08/06/11 0505 08/05/11 0530 08/04/11 0526 08/04/11 0520  WBC 7.4 11.6* 8.0 -- 7.1  NEUTROABS -- -- -- -- 4.9  HGB 10.5* 11.1* 11.2* 11.6* --  HCT 30.6* 33.2* 33.2* 34.0* --  MCV 94.2 94.9 96.5 -- 97.1  PLT 113* 121* 116* -- 109*   PT/INR: @labrcntip (inr:5) Cardiac Enzymes: No results found for this basename: CKTOTAL:5,CKMB:5,CKMBINDEX:5,TROPONINI:5 in the last 168 hours CBG: No results found for this basename: GLUCAP:5 in the last 168 hours  Iron Studies: No results found for this basename: IRON:30,TIBC:30,TRANSFERRIN:30,FERRITIN:30 in the last 168 hours  Physical Exam:  Blood pressure 126/68, pulse 94, temperature 97.3 F (36.3 C), temperature source Oral, resp. rate 18, height 5' 7.5" (1.715 m), weight 97.523  kg (215 lb), SpO2 97.00%.  Physical Exam:  General: comfortable  Heart: RRR  Lungs: minimal wheezing, improved Abdomen: obese, soft, NT/ND, +BS  Extremities: trace BLE edema  Dialysis Access: Left radialcephalic AVF, +T/B   Dialysis prescription: 4 1/4hr, EDW 99.5Kg, 3K/2.25Ca, 15G Needles- LFA AVF (buttonhole).  Heparin 8000units TIW, Epogen 1200units TIW and Infed 50mg  Q WEEK.    Impression/Recommendations:  1. COPD- improving. Per pulm 2. Pulm edema- resolved with HD 3. Dyspnea- due to #1 and #2. Better 4. ESRD -TTS NW; wt 104 on admit, lowest 95.3, 97.5 last night. EDW was 99.5.  Would lower dry weight to about 97.5 kg.   5. Anemia - Hgb 11.1, on low dose ESA; follow  6. Secondary hyperparathyroidism - ^ ca 11.4 yesterday, phoslo d/c'd; 2.0 ca bath; repeat labs pending  7. HTN/volume - BP too low with added ACEI and hydralazine. Wife has requested we resume his original home regimen which is cardura 2 bid, so I have d/c'd the other BP meds. BP up to 120/80 this am.  8. Nutrition - ^ protein diet; albumin with renal panel am 9. Disposition- per primary services. [place] for discharge from renal standpoint.    Rob Doctor, hospital  MD [place] [phone] pgr    [phone] cell 08/08/2011, 11:56 AM

## 2011-08-08 NOTE — Progress Notes (Signed)
  Echocardiogram 2D Echocardiogram has been performed.  Kirk [name] [name] 08/08/2011, 5:48 PM

## 2011-08-09 ENCOUNTER — Inpatient Hospital Stay (HOSPITAL_COMMUNITY): Payer: BC Managed Care – PPO

## 2011-08-09 DIAGNOSIS — N186 End stage renal disease: Secondary | ICD-10-CM

## 2011-08-09 LAB — RENAL FUNCTION PANEL
CO2: 21 mEq/L (ref 19–32)
Calcium: 9.6 mg/dL (ref 8.4–10.5)
GFR calc Af Amer: 5 mL/min — ABNORMAL LOW (ref >90)
Glucose, Bld: 117 mg/dL — ABNORMAL HIGH (ref 70–99)
Sodium: 136 mEq/L (ref 135–145)

## 2011-08-09 LAB — CBC
Hemoglobin: 10.7 g/dL — ABNORMAL LOW (ref 13.0–17.0)
MCH: 32.5 pg (ref 26.0–34.0)
MCV: 94.5 fL (ref 78.0–100.0)
RBC: 3.29 MIL/uL — ABNORMAL LOW (ref 4.22–5.81)

## 2011-08-09 MED ORDER — MOXIFLOXACIN HCL 400 MG PO TABS: 400.0000 mg | ORAL_TABLET | Freq: Every day | ORAL | Status: AC

## 2011-08-09 MED ORDER — ALBUTEROL SULFATE HFA 108 (90 BASE) MCG/ACT IN AERS: 2.0000 | INHALATION_SPRAY | Freq: Four times a day (QID) | RESPIRATORY_TRACT | Status: DC | PRN

## 2011-08-09 MED ORDER — PREDNISONE 10 MG PO TABS: ORAL_TABLET | ORAL | Status: AC

## 2011-08-09 NOTE — Progress Notes (Signed)
Pt. Got d/c instructions and prescriptions,IV was d/c,pt. Ready to go home.

## 2011-08-09 NOTE — Procedures (Signed)
I was present at this dialysis session. I have reviewed the session itself and made appropriate changes.   Kelly Splinter, MD [employer] 08/09/2011, 11:16 AM

## 2011-08-09 NOTE — Discharge Summary (Signed)
Physician Discharge Summary  Patient ID: Kirk [name] MRN: [id number] DOB/AGE: 1960-02-20 51 y.o.  Admit date: 08/04/2011 Discharge date: 08/09/2011  Problem List Active Problems:  Acute respiratory failure  COPD exacerbation  Gastroenteritis  OSA on CPAP  ESRD (end stage renal disease)  HPI: 51 yo male smoker admitted 08/04/2011 with acute dyspnea 2nd to acute pulmonary edema, and acute asthmatic bronchitis. Transiently required NIPPV. Hx of ESRD on HD T/TH/Sa, HTN, OSA.  Hospital Course: Cultures:  Blood 7/13 >>  Urine 7/13 >>negative  Antibiotics:  7/16 avelox (Asthmatic bronchitis)>>  Tests / Events:  7/17 Echo>>mild LVH, EF 60 to 123456, grade 1 diastolic dysfx, mild LA/RA dilation  Subjective:  Seen in HD. Feels better. Not as much cough. Not wheezing this morning.  Vital Signs:  Temp: [97.2 F (36.2 C)-98.1 F (36.7 C)] 97.3 F (36.3 C) (07/18 0626)  Pulse Rate: [78-108] 87 (07/18 0800)  Resp: [16-18] 18 (07/18 0800)  BP: (113-165)/(67-81) 125/75 mmHg (07/18 0800)  SpO2: [89 %-100 %] 92 % (07/18 0626)  Weight: [202 lb (91.627 kg)-221 lb 5.5 oz (100.4 kg)] 221 lb 5.5 oz (100.4 kg) (07/18 0626)  I/O last 3 completed shifts:  In: 960 [P.O.:960]  Out: -   Intake/Output Summary (Last 24 hours) at 08/09/11 0820 Last data filed at 08/08/11 1700   Gross per 24 hour   Intake  480 ml   Output  0 ml   Net  480 ml    Physical Examination:  General: Obese, no distress  Neuro: Normal strength  HEENT: No sinus tenderness  Neck: LAN  Cardiovascular: S1S2 regular  Lungs: decreased breath sounds, scattered rhonchi, no wheeze  Abdomen: Obese, soft, + bowel sounds  Musculoskeletal: Trace B LE edema  Skin: No rash   Lab  08/09/11 0700  08/07/11 0943  08/06/11 0505   NA  136  137  136   K  4.4  3.6  4.3   CL  94*  90*  90*   CO2  21  22  24    BUN  90*  99*  69*   CREATININE  12.37*  14.07*  11.37*   GLUCOSE  117*  133*  143*     Lab  08/09/11 0659  08/07/11 0942   08/06/11 0505   HGB  10.7*  10.5*  11.1*   HCT  31.1*  30.6*  33.2*   WBC  8.9  7.4  11.6*   PLT  117*  113*  121*    Assessment and Plan:  PULMONARY  A: Acute hypoxic respiratory failure 2nd to acute pulmonary edema from incomplete HD, and acute asthmatic bronchitis with Hx of smoking and ?of COPD. Hx of OSA.  P:  Wean off prednisone as tolerated over next 7 days  D3/10 avelox  Continue albuterol as needed via MDI Will need to assess further as outpt with PFT's to determine if he needs chronic inhaler therapy  Needs to stop smoking  Continue CPAP qhs  Keep in negative fluid balance  Ambulated 7/17 on room air with O2 sats lowest 91%>>no need for home oxygen  CARDIOLOGY  A: HTN with borderline BP  P:  Stable on current regimen  RENAL  A: ESRD with acute volume overload  P:  HD per renal  GASTROINTESTINAL  A: Apparent gastroenteritis prior to admission, ? Viral syndrome>>resolved  P:  - PO diet as he can tolerate  INFECTIOUS:  A: Acute asthmatic bronchitis  P:  D3/7 avelox  HEMATOLOGY:  A: Anemia of chronic disease  P:  follow CBC as outpt  ENDOCRINE:  A: No active issues.  NEURO:  A: no acute issues  Much improved. Will likely d/c home after HD today. Will need outpt pulmonary follow up in one week.        Labs at discharge Lab Results  Component Value Date   CREATININE 12.37* 08/09/2011   BUN 90* 08/09/2011   NA 136 08/09/2011   K 4.4 08/09/2011   CL 94* 08/09/2011   CO2 21 08/09/2011   Lab Results  Component Value Date   WBC 8.9 08/09/2011   HGB 10.7* 08/09/2011   HCT 31.1* 08/09/2011   MCV 94.5 08/09/2011   PLT 117* 08/09/2011   Lab Results  Component Value Date   ALT 17 12/19/2006   AST 17 12/19/2006   ALKPHOS 56 12/19/2006   BILITOT 0.5 12/19/2006   Lab Results  Component Value Date   INR 0.9 01/10/2007   INR 0.9 12/13/2006    Current radiology studies No results found.  Disposition:  01-Home or Self Care  Discharge Orders    Future  Appointments: Provider: Department: Dept Phone: Center:   08/15/2011 1:30 PM [name], MD [facility] [phone] None     Future Orders Please Complete By Expires   Diet - low sodium heart healthy      Increase activity slowly        Medication List  As of 08/09/2011  9:27 AM   STOP taking these medications         zolpidem 5 MG tablet         TAKE these medications         aspirin 81 MG tablet   Take 81 mg by mouth daily.      calcium acetate 667 MG capsule   Commonly known as: PHOSLO   Take 2,001 mg by mouth 3 (three) times daily with meals. Take 1 capsule with snacks      doxazosin 2 MG tablet   Commonly known as: CARDURA   Take 2 mg by mouth 2 (two) times daily.      moxifloxacin 400 MG tablet   Commonly known as: AVELOX   Take 1 tablet (400 mg total) by mouth daily at 8 pm.      multivitamin Tabs tablet   Take 1 tablet by mouth daily.      pravastatin 20 MG tablet   Commonly known as: PRAVACHOL   Take 20 mg by mouth daily.      predniSONE 10 MG tablet   Commonly known as: DELTASONE   Take 4 tabs  daily with food x 4 days, then 3 tabs daily x 4 days, then 2 tabs daily x 4 days, then 1 tab daily x4 days then stop. #40      traMADol 50 MG tablet   Commonly known as: ULTRAM   Take 100 mg by mouth every 6 (six) hours as needed. For pain      vitamin C 500 MG tablet   Commonly known as: ASCORBIC ACID   Take 500 mg by mouth daily.          Ventolin MDI 2 puffs q6h prn wheezing Follow-up Information    Follow up with Elvi Leventhal, MD on 08/15/2011. (1:30 pm)    Contact information:   [address] [place] [phone]          Discharged Condition: good Vital signs at  Discharge. Temp:  [97.2 F (36.2 C)-98.1 F (36.7 C)] 97.3 F (36.3 C) (07/18 0626) Pulse Rate:  [78-108] 80  (07/18 0900) Resp:  [16-18] 18  (07/18 0900) BP: (113-165)/(67-81) 126/73 mmHg (07/18 0900) SpO2:  [89 %-100 %]  92 % (07/18 0626) Weight:  [202 lb (91.627 kg)-221 lb 5.5 oz (100.4 kg)] 221 lb 5.5 oz (100.4 kg) (07/18 [id number]) Office follow up Special Information or instructions. Follow up with Dr. Halford Chessman for AECOPD and OSA with Cpap machine that may need adjustment once AECOPD resolved. Signed: Richardson Landry Minor ACNP [facility] Pager [phone] till 3 pm If no answer page [phone] 08/09/2011, 9:27 AM  [name], MD [facility] 08/09/2011, 2:19 PM Pager:  [phone] After 3pm call: [phone]

## 2011-08-09 NOTE — Progress Notes (Signed)
Subjective:  Feeling much better; seen on HD; still wheezing but cleared with cough; O2 Sat 92% on RA; BP much improved; ready for discharge     Vital signs in last 24 hours: Filed Vitals:   08/09/11 0700 08/09/11 0730 08/09/11 0800 08/09/11 0830  BP: 133/70 131/80 125/75 128/72  Pulse: 78 89 87 82  Temp:      TempSrc:      Resp: 18 18 18 18   Height:      Weight:      SpO2:       Weight change: -10.173 kg (-22 lb 6.9 oz)  Intake/Output Summary (Last 24 hours) at 08/09/11 0842 Last data filed at 08/08/11 1700  Gross per 24 hour  Intake    480 ml  Output      0 ml  Net    480 ml   Labs: Basic Metabolic Panel:  Lab [id number] 0700 08/07/11 0943 08/06/11 0505 08/05/11 0530  NA 136 137 136 --  K 4.4 3.6 4.3 --  CL 94* 90* 90* --  CO2 21 22 24  --  GLUCOSE 117* 133* 143* --  BUN 90* 99* 69* --  CREATININE 12.37* 14.07* 11.37* --  CALCIUM 9.6 10.5 11.4* --  ALB -- -- -- --  PHOS 3.0 6.6* -- 2.6   Liver Function Tests:  Lab 08/09/11 0700 08/07/11 0943  AST -- --  ALT -- --  ALKPHOS -- --  BILITOT -- --  PROT -- --  ALBUMIN 3.4* 3.5   No results found for this basename: LIPASE:3,AMYLASE:3 in the last 168 hours No results found for this basename: AMMONIA:3 in the last 168 hours CBC:  Lab 08/09/11 0659 08/07/11 0942 08/06/11 0505 08/05/11 0530 08/04/11 0520  WBC 8.9 7.4 11.6* -- --  NEUTROABS -- -- -- -- 4.9  HGB 10.7* 10.5* 11.1* -- --  HCT 31.1* 30.6* 33.2* -- --  MCV 94.5 94.2 94.9 96.5 97.1  PLT 117* 113* 121* -- --   Cardiac Enzymes: No results found for this basename: CKTOTAL:5,CKMB:5,CKMBINDEX:5,TROPONINI:5 in the last 168 hours CBG: No results found for this basename: GLUCAP:5 in the last 168 hours  Iron Studies: No results found for this basename: IRON,TIBC,TRANSFERRIN,FERRITIN in the last 72 hours Studies/Results: No results found. Medications:      . albuterol  2.5 mg Nebulization Q6H  . aspirin EC  81 mg Oral Daily  . doxazosin  2 mg Oral BID    . heparin  5,000 Units Subcutaneous Q8H  . ipratropium  0.5 mg Nebulization Q6H  . moxifloxacin  400 mg Oral Q2000  . multivitamin  1 tablet Oral Daily  . pantoprazole  40 mg Oral BID AC  . predniSONE  50 mg Oral Q breakfast  . simvastatin  20 mg Oral q1800  . sodium chloride  3 mL Intravenous Q12H  . vitamin C  500 mg Oral Daily    I  have reviewed scheduled and prn medications.  Physical Exam:  General: comfortable  Heart: RRR  Lungs: minimal wheezing; cleared with cough  Abdomen: obese, soft, NT/ND, +BS  Extremities: no ankle edema  Dialysis Access: Left radialcephalic AVF, +T/B   Dialysis prescription: 4 1/4hr, EDW 99.5Kg, 3K/2.25Ca, 15G Needles- LFA AVF (buttonhole).  Heparin 8000units TIW, Epogen 1200units TIW and Infed 50mg  Q WEEK.   Impression/Recommendations:  1. COPD- improved; on prednisone and Avelox; will need taper dosing at discharge   2. Pulm edema- resolved with HD 3. Dyspnea- due to #1 and #2. O2  sat 92% on room air 4. ESRD -TTS NW; eDW was 99.5; change to post tx wt today; suspect around 97.5 kg; UF goal today 3.0.  5. Anemia - Hgb 10.7; remains on low dose ESA; follow  6. Secondary hyperparathyroidism - ca now down 9.6 with 2.0 Ca bath; phos 3.0; no binders; resume 2 Phoslo with meals (diet at home); re-evaluate with labs next wk outpt setting; resume previous bath (2.25) and follow outpt  7. HTN/volume -  Controlled on Cardura 2 bid and UF with HD; new eDW at discharge. 8. Nutrition - albumin 3.4, cont ^ protein renal diet 9. Disposition- discharge after HD today.   Marni Griffon, FNP-C [facility] Pager [phone]  08/09/2011,8:42 AM  LOS: 5 days   Patient seen and examined and agree with assessment and plan as above.  Kelly Splinter  MD [facility] [phone] pgr    [phone] cell 08/09/2011, 11:16 AM

## 2011-08-09 NOTE — Progress Notes (Signed)
Name: Kirk [name] MRN: [id number] DOB: 13-Dec-1960    LOS: 5  PCCM Progress NOTE  History of Present Illness:  51 yo male smoker admitted 08/04/2011 with acute dyspnea 2nd to acute pulmonary edema, and acute asthmatic bronchitis.  Transiently required NIPPV.  Hx of ESRD on HD T/TH/Sa, HTN, OSA.  Cultures: Blood 7/13 >> Urine 7/13 >>negative  Antibiotics: 7/16 avelox (Asthmatic bronchitis)>>  Tests / Events: 7/17 Echo>>mild LVH, EF 60 to 123456, grade 1 diastolic dysfx, mild LA/RA dilation  Subjective:  Seen in HD.  Feels better.  Not as much cough.  Not wheezing this morning.  Vital Signs: Temp:  [97.2 F (36.2 C)-98.1 F (36.7 C)] 97.3 F (36.3 C) (07/18 0626) Pulse Rate:  [78-108] 87  (07/18 0800) Resp:  [16-18] 18  (07/18 0800) BP: (113-165)/(67-81) 125/75 mmHg (07/18 0800) SpO2:  [89 %-100 %] 92 % (07/18 0626) Weight:  [202 lb (91.627 kg)-221 lb 5.5 oz (100.4 kg)] 221 lb 5.5 oz (100.4 kg) (07/18 0626) I/O last 3 completed shifts: In: 960 [P.O.:960] Out: -   Intake/Output Summary (Last 24 hours) at 08/09/11 0820 Last data filed at 08/08/11 1700  Gross per 24 hour  Intake    480 ml  Output      0 ml  Net    480 ml    Physical Examination: General: Obese, no distress Neuro: Normal strength HEENT:  No sinus tenderness Neck:  LAN Cardiovascular:  S1S2 regular Lungs: decreased breath sounds, scattered rhonchi, no wheeze Abdomen:  Obese, soft, + bowel sounds Musculoskeletal:  Trace B LE edema Skin:  No rash   Lab 08/09/11 0700 08/07/11 0943 08/06/11 0505  NA 136 137 136  K 4.4 3.6 4.3  CL 94* 90* 90*  CO2 21 22 24   BUN 90* 99* 69*  CREATININE 12.37* 14.07* 11.37*  GLUCOSE 117* 133* 143*    Lab 08/09/11 0659 08/07/11 0942 08/06/11 0505  HGB 10.7* 10.5* 11.1*  HCT 31.1* 30.6* 33.2*  WBC 8.9 7.4 11.6*  PLT 117* 113* 121*    Assessment and Plan: PULMONARY  A: Acute hypoxic respiratory failure 2nd to acute pulmonary edema from incomplete HD, and acute  asthmatic bronchitis with Hx of smoking and ?of COPD.  Hx of OSA. P: Wean off prednisone as tolerated over next 7 days D3/7 avelox Continue albuterol as needed Will need to assess further as outpt with PFT's to determine if he needs chronic inhaler therapy Needs to stop smoking Continue CPAP qhs Keep in negative fluid balance Ambulated 7/17 on room air with O2 sats lowest 91%>>no need for home oxygen  CARDIOLOGY  A: HTN with borderline BP P:  Stable on current regimen  RENAL  A: ESRD with acute volume overload P:  HD per renal  GASTROINTESTINAL  A: Apparent gastroenteritis prior to admission, ? Viral syndrome>>resolved P: - PO diet as he can tolerate  INFECTIOUS: A: Acute asthmatic bronchitis P: D3/7 avelox  HEMATOLOGY: A: Anemia of chronic disease P: follow CBC as outpt  ENDOCRINE: A: No active issues.   NEURO:  A: no acute issues  Much improved.  Will likely d/c home after HD today.  Will need outpt pulmonary follow up in one week.  [name], MD [facility] Pulmonary/Critical Care 08/09/2011, 8:20 AM Pager:  [phone] After 3pm call: [phone]

## 2011-08-10 LAB — CULTURE, BLOOD (ROUTINE X 2): Culture: NO GROWTH

## 2011-08-15 ENCOUNTER — Encounter: Payer: Self-pay | Admitting: Pulmonary Disease

## 2011-08-15 ENCOUNTER — Ambulatory Visit (INDEPENDENT_AMBULATORY_CARE_PROVIDER_SITE_OTHER): Payer: BC Managed Care – PPO | Admitting: Pulmonary Disease

## 2011-08-15 VITALS — BP 162/60 | HR 90 | Ht 67.5 in | Wt 225.2 lb

## 2011-08-15 DIAGNOSIS — G4733 Obstructive sleep apnea (adult) (pediatric): Secondary | ICD-10-CM

## 2011-08-15 DIAGNOSIS — J45909 Unspecified asthma, uncomplicated: Secondary | ICD-10-CM

## 2011-08-15 MED ORDER — MOMETASONE FURO-FORMOTEROL FUM 100-5 MCG/ACT IN AERO: 2.0000 | INHALATION_SPRAY | Freq: Two times a day (BID) | RESPIRATORY_TRACT | Status: DC

## 2011-08-15 NOTE — Assessment & Plan Note (Signed)
He is compliant with therapy and reports benefit.  He will contact his DME for new mask.

## 2011-08-15 NOTE — Progress Notes (Signed)
Chief Complaint  Patient presents with  . HFU    breathing is little better, slight cough, occasional wheezing, chest tx. pt will finish avelox today  . Sleep Apnea    pt states he wears his cpap machine eveyrnight x 4-8 hrs a night. pt states his mask does not fit him--he just got a new one in april and a new machine    History of Present Illness: Kirk [name] is a 51 y.o. male smoker with asthma, OSA.  He is no longer smoking.   His breathing is doing better.  He still gets occasional cough, but not much sputum.  He is not wheezing as much.  He denies fever or chest pain.  He is using albuterol twice per day, and this helps.  He is using CPAP.  This helps.  He needs a new mask.  Past Medical History  Diagnosis Date  . Anxiety   . Chronic headaches   . Chronic kidney disease (CKD)   . Dialysis care 02/06/2010  . HTN (hypertension)   . Sleep apnea     wears the CPAP    Past Surgical History  Procedure Date  . Inner ear surgery     for deafness at age 89  . Colonoscopy   . Dialysis shunt placement     Outpatient Encounter Prescriptions as of 08/15/2011  Medication Sig Dispense Refill  . albuterol (PROVENTIL HFA;VENTOLIN HFA) 108 (90 BASE) MCG/ACT inhaler Inhale 2 puffs into the lungs every 6 (six) hours as needed for wheezing.  1 Inhaler  2  . aspirin 81 MG tablet Take 81 mg by mouth daily.      . calcium acetate (PHOSLO) 667 MG capsule Take 2,001 mg by mouth 3 (three) times daily with meals. Take 1 capsule with snacks      . doxazosin (CARDURA) 2 MG tablet Take 2 mg by mouth 2 (two) times daily.       Marland Kitchen moxifloxacin (AVELOX) 400 MG tablet Take 1 tablet (400 mg total) by mouth daily at 8 pm.  7 tablet  0  . multivitamin (RENA-VIT) TABS tablet Take 1 tablet by mouth daily.      . pravastatin (PRAVACHOL) 20 MG tablet Take 20 mg by mouth daily.      . predniSONE (DELTASONE) 10 MG tablet Take 4 tabs  daily with food x 4 days, then 3 tabs daily x 4 days, then 2 tabs daily x 4  days, then 1 tab daily x4 days then stop. #40  40 tablet  0  . traMADol (ULTRAM) 50 MG tablet Take 100 mg by mouth every 6 (six) hours as needed. For pain      . vitamin C (ASCORBIC ACID) 500 MG tablet Take 500 mg by mouth daily.      . mometasone-formoterol (DULERA) 100-5 MCG/ACT AERO Inhale 2 puffs into the lungs 2 (two) times daily.  1 Inhaler  5    Allergies  Allergen Reactions  . Codeine Nausea And Vomiting    Physical Exam:  Blood pressure 162/60, pulse 90, height 5' 7.5" (1.715 m), weight 225 lb 3.2 oz (102.15 kg), SpO2 99.00%. Body mass index is 34.75 kg/(m^2). Wt Readings from Last 2 Encounters:  08/15/11 225 lb 3.2 oz (102.15 kg)  08/09/11 219 lb 5.7 oz (99.5 kg)    General - obese HEENT - no sinus tenderness, no oral exudate Cardiac - s1s2 regular, no murmur Chest - no wheeze, no rales Abd - soft, non tender Ext -  AV graft left arm Neuro - normal strength Psych - normal mood, behavior   Assessment/Plan:  Kirk Mires, MD Kirk [name] Pulmonary/Critical Care/Sleep Pager:  [phone] 08/15/2011, 2:15 PM

## 2011-08-15 NOTE — Patient Instructions (Signed)
Dulera two puffs twice per day, and rinse mouth after each use Albuterol two puffs as needed for cough, wheeze, or chest congestion Follow up in 6 weeks

## 2011-08-15 NOTE — Assessment & Plan Note (Signed)
He has improvement since recent hospital stay.  He is to complete antibiotics and prednisone.  He still has frequent albuterol use.  Will add dulera.  Will follow up in 6 weeks, and then determine when to do PFTs.

## 2011-08-16 DIAGNOSIS — H9190 Unspecified hearing loss, unspecified ear: Secondary | ICD-10-CM | POA: Insufficient documentation

## 2011-08-16 DIAGNOSIS — E669 Obesity, unspecified: Secondary | ICD-10-CM | POA: Insufficient documentation

## 2011-08-17 HISTORY — PX: KIDNEY TRANSPLANT: SHX239

## 2011-08-28 DIAGNOSIS — T8610 Unspecified complication of kidney transplant: Secondary | ICD-10-CM | POA: Insufficient documentation

## 2011-08-28 DIAGNOSIS — T8611 Kidney transplant rejection: Secondary | ICD-10-CM | POA: Insufficient documentation

## 2011-08-30 DIAGNOSIS — E1121 Type 2 diabetes mellitus with diabetic nephropathy: Secondary | ICD-10-CM | POA: Insufficient documentation

## 2011-08-30 DIAGNOSIS — E119 Type 2 diabetes mellitus without complications: Secondary | ICD-10-CM

## 2011-08-30 DIAGNOSIS — Z298 Encounter for other specified prophylactic measures: Secondary | ICD-10-CM | POA: Insufficient documentation

## 2011-09-10 DIAGNOSIS — Z940 Kidney transplant status: Secondary | ICD-10-CM | POA: Insufficient documentation

## 2011-09-27 ENCOUNTER — Ambulatory Visit: Payer: BC Managed Care – PPO | Admitting: Pulmonary Disease

## 2012-01-31 ENCOUNTER — Encounter (HOSPITAL_COMMUNITY): Payer: Self-pay | Admitting: Cardiology

## 2012-01-31 ENCOUNTER — Emergency Department (HOSPITAL_COMMUNITY): Payer: BC Managed Care – PPO

## 2012-01-31 ENCOUNTER — Emergency Department (HOSPITAL_COMMUNITY)
Admission: EM | Admit: 2012-01-31 | Discharge: 2012-01-31 | Disposition: A | Discharge status: Home / Self Care | Payer: BC Managed Care – PPO | Attending: Emergency Medicine | Admitting: Emergency Medicine

## 2012-01-31 DIAGNOSIS — Z992 Dependence on renal dialysis: Secondary | ICD-10-CM | POA: Insufficient documentation

## 2012-01-31 DIAGNOSIS — Z9889 Other specified postprocedural states: Secondary | ICD-10-CM | POA: Insufficient documentation

## 2012-01-31 DIAGNOSIS — Z7982 Long term (current) use of aspirin: Secondary | ICD-10-CM | POA: Insufficient documentation

## 2012-01-31 DIAGNOSIS — Z940 Kidney transplant status: Secondary | ICD-10-CM | POA: Insufficient documentation

## 2012-01-31 DIAGNOSIS — I129 Hypertensive chronic kidney disease with stage 1 through stage 4 chronic kidney disease, or unspecified chronic kidney disease: Secondary | ICD-10-CM | POA: Insufficient documentation

## 2012-01-31 DIAGNOSIS — Z79899 Other long term (current) drug therapy: Secondary | ICD-10-CM | POA: Insufficient documentation

## 2012-01-31 DIAGNOSIS — Z87891 Personal history of nicotine dependence: Secondary | ICD-10-CM | POA: Insufficient documentation

## 2012-01-31 DIAGNOSIS — G8929 Other chronic pain: Secondary | ICD-10-CM | POA: Insufficient documentation

## 2012-01-31 DIAGNOSIS — R109 Unspecified abdominal pain: Secondary | ICD-10-CM | POA: Insufficient documentation

## 2012-01-31 DIAGNOSIS — R51 Headache: Secondary | ICD-10-CM | POA: Insufficient documentation

## 2012-01-31 DIAGNOSIS — G4730 Sleep apnea, unspecified: Secondary | ICD-10-CM | POA: Insufficient documentation

## 2012-01-31 DIAGNOSIS — F411 Generalized anxiety disorder: Secondary | ICD-10-CM | POA: Insufficient documentation

## 2012-01-31 DIAGNOSIS — IMO0002 Reserved for concepts with insufficient information to code with codable children: Secondary | ICD-10-CM | POA: Insufficient documentation

## 2012-01-31 DIAGNOSIS — N189 Chronic kidney disease, unspecified: Secondary | ICD-10-CM | POA: Insufficient documentation

## 2012-01-31 LAB — URINALYSIS, ROUTINE W REFLEX MICROSCOPIC
Bilirubin Urine: NEGATIVE
Glucose, UA: NEGATIVE mg/dL
Hgb urine dipstick: NEGATIVE
Specific Gravity, Urine: 1.025 (ref 1.005–1.030)
pH: 5.5 (ref 5.0–8.0)

## 2012-01-31 LAB — CBC
HCT: 35.7 % — ABNORMAL LOW (ref 39.0–52.0)
Hemoglobin: 12.6 g/dL — ABNORMAL LOW (ref 13.0–17.0)
MCHC: 35.3 g/dL (ref 30.0–36.0)

## 2012-01-31 LAB — BASIC METABOLIC PANEL
BUN: 18 mg/dL (ref 6–23)
CO2: 22 mEq/L (ref 19–32)
Chloride: 105 mEq/L (ref 96–112)
GFR calc non Af Amer: 39 mL/min — ABNORMAL LOW (ref >90)
Glucose, Bld: 184 mg/dL — ABNORMAL HIGH (ref 70–99)
Potassium: 4.1 mEq/L (ref 3.5–5.1)

## 2012-01-31 MED ORDER — ONDANSETRON HCL 4 MG/2ML IJ SOLN
4.0000 mg | Freq: Once | INTRAMUSCULAR | Status: AC
  Administered 2012-01-31: 4 mg via INTRAVENOUS
  Filled 2012-01-31: qty 2

## 2012-01-31 MED ORDER — OXYCODONE-ACETAMINOPHEN 5-325 MG PO TABS: 1.0000 | ORAL_TABLET | ORAL | Status: AC | PRN

## 2012-01-31 MED ORDER — HYDROMORPHONE HCL PF 1 MG/ML IJ SOLN
1.0000 mg | Freq: Once | INTRAMUSCULAR | Status: AC
  Administered 2012-01-31: 1 mg via INTRAVENOUS
  Filled 2012-01-31: qty 1

## 2012-01-31 MED ORDER — IOHEXOL 300 MG/ML  SOLN
50.0000 mL | Freq: Once | INTRAMUSCULAR | Status: AC | PRN
  Administered 2012-01-31: 50 mL via ORAL

## 2012-01-31 NOTE — ED Notes (Signed)
MD at bedside. 

## 2012-01-31 NOTE — ED Notes (Signed)
Pt reports that when he rolled out of bed this morning, sudden onset of right flank pain. Denies urinary syx. Denies previous pain like this.

## 2012-01-31 NOTE — ED Provider Notes (Signed)
History     CSN: [id number]  Arrival date & time 01/31/12  1619   None     Chief Complaint  Patient presents with  . Flank Pain    (Consider location/radiation/quality/duration/timing/severity/associated sxs/prior treatment) Patient is a 52 y.o. male presenting with flank pain. The history is provided by the patient and a significant other. No language interpreter was used.  Flank Pain Chronicity:  the ppaatient is a 52 year old  man who received renal transplant this past summer. Today he developed pain in the right flank early this morning when he rolled over in bed. The current episode started 6 to 12 hours ago. The problem occurs constantly (The pain waxes and wanes, it is worse when he stands up.). The problem has not changed since onset.Associated symptoms include abdominal pain. Associated symptoms comments: None.. The symptoms are aggravated by standing. Relieved by: Lying still. He has tried nothing (He called the renal transplant service at [facility] today and was advised to come to the ED for evaluation.) for the symptoms.    Past Medical History  Diagnosis Date  . Anxiety   . Chronic headaches   . Chronic kidney disease (CKD)   . Dialysis care 02/06/2010  . HTN (hypertension)   . Sleep apnea     wears the CPAP    Past Surgical History  Procedure Date  . Inner ear surgery     for deafness at age 68  . Colonoscopy   . Dialysis shunt placement   . Kidney transplant     Family History  Problem Relation Age of Onset  . Colon cancer Neg Hx   . Esophageal cancer Neg Hx   . Rectal cancer Neg Hx   . Stomach cancer Neg Hx     History  Substance Use Topics  . Smoking status: Former Smoker -- 28 years    Types: Cigarettes    Quit date: 08/04/2011  . Smokeless tobacco: Never Used     Comment: 1/2 pack every 4 days  . Alcohol Use: No      Review of Systems  Constitutional: Positive for chills. Negative for fever.       He feels hot and  cold.  HENT: Negative.   Eyes: Negative.   Respiratory: Negative.        He has sleep apnea and uses CPAP at night.  Cardiovascular: Negative.   Gastrointestinal: Positive for abdominal pain.  Genitourinary: Positive for flank pain. Negative for dysuria and hematuria.  Musculoskeletal: Negative.   Skin: Negative.   Neurological: Negative.   Psychiatric/Behavioral: Negative.     Allergies  Codeine  Home Medications   Current Outpatient Rx  Name  Route  Sig  Dispense  Refill  . AMLODIPINE BESYLATE 10 MG PO TABS   Oral   Take 10 mg by mouth daily.         . ASPIRIN EC 81 MG PO TBEC   Oral   Take 81 mg by mouth daily.         Marland Kitchen CARVEDILOL 6.25 MG PO TABS   Oral   Take 6.25 mg by mouth 2 (two) times daily with a meal.         . CHOLECALCIFEROL 5000 UNITS PO TABS   Oral   Take 5,000 Units by mouth every morning.         Marland Kitchen DOXAZOSIN MESYLATE 2 MG PO TABS   Oral   Take 2 mg by mouth 2 (two) times daily.          Marland Kitchen  LORATADINE 10 MG PO TABS   Oral   Take 10 mg by mouth daily.         Marland Kitchen RENA-VITE PO TABS   Oral   Take 1 tablet by mouth daily.         Marland Kitchen MYCOPHENOLATE SODIUM 360 MG PO TBEC   Oral   Take 360 mg by mouth 2 (two) times daily.         Marland Kitchen OMEPRAZOLE 20 MG PO CPDR   Oral   Take 20 mg by mouth every morning.         Marland Kitchen PRAVASTATIN SODIUM 40 MG PO TABS   Oral   Take 40 mg by mouth every evening.         Marland Kitchen PREDNISONE 5 MG PO TABS   Oral   Take 5 mg by mouth daily.         Marland Kitchen PROBIOTIC PO   Oral   Take 2 capsules by mouth 2 (two) times daily.         . SULFAMETHOXAZOLE-TMP DS 800-160 MG PO TABS   Oral   Take 1 tablet by mouth 3 (three) times a week.         Marland Kitchen TACROLIMUS 1 MG PO CAPS   Oral   Take 7 mg by mouth 2 (two) times daily.           BP 167/80  Pulse 87  Temp 98.8 F (37.1 C) (Oral)  Resp 18  SpO2 95%  Physical Exam  Nursing note and vitals reviewed. Constitutional: He is oriented to person, place, and  time. He appears well-developed and well-nourished. Distressed: in moderate distress with pain in the right lower abdominal quadrant.  HENT:  Head: Normocephalic and atraumatic.  Right Ear: External ear normal.  Left Ear: External ear normal.  Mouth/Throat: Oropharynx is clear and moist.  Eyes: Conjunctivae normal and EOM are normal. Pupils are equal, round, and reactive to light.  Neck: Normal range of motion.  Cardiovascular: Normal rate, regular rhythm and normal heart sounds.   Pulmonary/Chest: Effort normal and breath sounds normal.  Abdominal:       Patient's abdomen seems distended. He localizes pain to the right lower quadrant, where he has a well-healed right lower quadrant scar. There is no mass or point of tenderness.  Musculoskeletal: Normal range of motion. He exhibits no edema and no tenderness.  Neurological: He is alert and oriented to person, place, and time.       No sensory or motor deficit.  Skin: Skin is warm and dry.  Psychiatric: He has a normal mood and affect. His behavior is normal.    ED Course  Procedures (including critical care time)  Labs Reviewed  CBC - Abnormal; Notable for the following:    RBC 4.05 (*)     Hemoglobin 12.6 (*)     HCT 35.7 (*)     Platelets 112 (*)  PLATELET COUNT CONFIRMED BY SMEAR   All other components within normal limits  BASIC METABOLIC PANEL - Abnormal; Notable for the following:    Glucose, Bld 184 (*)     Creatinine, Ser 1.91 (*)     GFR calc non Af Amer 39 (*)     GFR calc Af Amer 46 (*)     All other components within normal limits  URINALYSIS, ROUTINE W REFLEX MICROSCOPIC   6:10 PM Pt was seen and had physical examination.  Lab workup shows mild renal insufficiency.  Case discussed with  Dr. Rosario Jacks, radiologist, who recommends CT of abdomen/pelvis with oral contrast only.  IV pain meds and nausea meds ordered.  10:08 PM Results for orders placed during the hospital encounter of 01/31/12  CBC      Component Value  Range   WBC 5.1  4.0 - 10.5 K/uL   RBC 4.05 (*) 4.22 - 5.81 MIL/uL   Hemoglobin 12.6 (*) 13.0 - 17.0 g/dL   HCT 35.7 (*) 39.0 - 52.0 %   MCV 88.1  78.0 - 100.0 fL   MCH 31.1  26.0 - 34.0 pg   MCHC 35.3  30.0 - 36.0 g/dL   RDW 12.9  11.5 - 15.5 %   Platelets 112 (*) 150 - 400 K/uL  BASIC METABOLIC PANEL      Component Value Range   Sodium 141  135 - 145 mEq/L   Potassium 4.1  3.5 - 5.1 mEq/L   Chloride 105  96 - 112 mEq/L   CO2 22  19 - 32 mEq/L   Glucose, Bld 184 (*) 70 - 99 mg/dL   BUN 18  6 - 23 mg/dL   Creatinine, Ser 1.91 (*) 0.50 - 1.35 mg/dL   Calcium 9.4  8.4 - 10.5 mg/dL   GFR calc non Af Amer 39 (*) >90 mL/min   GFR calc Af Amer 46 (*) >90 mL/min  URINALYSIS, ROUTINE W REFLEX MICROSCOPIC      Component Value Range   Color, Urine YELLOW  YELLOW   APPearance CLEAR  CLEAR   Specific Gravity, Urine 1.025  1.005 - 1.030   pH 5.5  5.0 - 8.0   Glucose, UA NEGATIVE  NEGATIVE mg/dL   Hgb urine dipstick NEGATIVE  NEGATIVE   Bilirubin Urine NEGATIVE  NEGATIVE   Ketones, ur NEGATIVE  NEGATIVE mg/dL   Protein, ur NEGATIVE  NEGATIVE mg/dL   Urobilinogen, UA 1.0  0.0 - 1.0 mg/dL   Nitrite NEGATIVE  NEGATIVE   Leukocytes, UA NEGATIVE  NEGATIVE   Ct Abdomen Pelvis Wo Contrast  01/31/2012  *RADIOLOGY REPORT*  Clinical Data: Renal transplant patient.  Right lower quadrant pain.  CT ABDOMEN AND PELVIS WITHOUT CONTRAST  Technique:  Multidetector CT imaging of the abdomen and pelvis was performed following the standard protocol without intravenous contrast.  Comparison: None.  Findings: Clear lung bases.  Cardiomegaly.  Coronary atherosclerosis.  Unremarkable liver, spleen, pancreas, and adrenal glands.  Small shrunken native kidneys.  Normal appearing stomach, small bowel, and colon.  Normal sized aorta with slight atheromatous change. No lymphadenopathy.  Renal transplant kidney overlies the psoas in the right lower quadrant and pelvis region.  There is no free fluid in the pelvis or  surrounding the transplant kidney.  There is no visible transplant hydronephrosis or calculi.  The bladder appears unremarkable.  Normal appearing prostate and seminal vesicles. Osseous structures unremarkable. No visible appendiceal information.  IMPRESSION: Unremarkable noncontrast CT in this patient with right lower quadrant transplant kidney.  No CT signs of hydronephrosis or perinephric fluid collection.   Original Report Authenticated By: Rolla Flatten, M.D.    Call to [facility] -->  They advised symptomatic treatment with pain medicines, and they will call him in for a clinic visit tomorrow.    1. Abdominal  pain, other specified site   2. History of renal transplant           Mylinda Latina III, MD 01/31/12 2241

## 2012-01-31 NOTE — ED Notes (Signed)
Pt reports he had a kidney transplant back in July and started having pain on the right side this morning. Transplant was preformed at [facility]. States the pain feels like lightning in his side. Denies any urinary symptoms. No fevers at home.

## 2012-03-27 DIAGNOSIS — M5410 Radiculopathy, site unspecified: Secondary | ICD-10-CM | POA: Insufficient documentation

## 2012-05-08 DIAGNOSIS — M25569 Pain in unspecified knee: Secondary | ICD-10-CM | POA: Insufficient documentation

## 2012-05-12 ENCOUNTER — Other Ambulatory Visit: Payer: BC Managed Care – PPO

## 2012-05-12 ENCOUNTER — Other Ambulatory Visit: Payer: Self-pay | Admitting: *Deleted

## 2012-05-12 DIAGNOSIS — I10 Essential (primary) hypertension: Secondary | ICD-10-CM

## 2012-05-12 DIAGNOSIS — E785 Hyperlipidemia, unspecified: Secondary | ICD-10-CM

## 2012-05-12 DIAGNOSIS — E1129 Type 2 diabetes mellitus with other diabetic kidney complication: Secondary | ICD-10-CM

## 2012-05-12 DIAGNOSIS — E1165 Type 2 diabetes mellitus with hyperglycemia: Secondary | ICD-10-CM

## 2012-05-12 DIAGNOSIS — M109 Gout, unspecified: Secondary | ICD-10-CM

## 2012-05-13 LAB — BASIC METABOLIC PANEL
BUN/Creatinine Ratio: 15 (ref 9–20)
BUN: 20 mg/dL (ref 6–24)
CO2: 21 mmol/L (ref 19–28)
Creatinine, Ser: 1.37 mg/dL — ABNORMAL HIGH (ref 0.76–1.27)

## 2012-05-13 LAB — CBC WITH DIFFERENTIAL/PLATELET
Basos: 0 % (ref 0–3)
Eos: 3 % (ref 0–5)
Hemoglobin: 12.9 g/dL (ref 12.6–17.7)
Lymphs: 36 % (ref 14–46)
MCH: 30.6 pg (ref 26.6–33.0)
Monocytes: 12 % (ref 4–12)
Neutrophils Absolute: 2.2 10*3/uL (ref 1.4–7.0)
RBC: 4.22 x10E6/uL (ref 4.14–5.80)

## 2012-05-13 LAB — HEMOGLOBIN A1C
Est. average glucose Bld gHb Est-mCnc: 166 mg/dL
Hgb A1c MFr Bld: 7.4 % — ABNORMAL HIGH (ref 4.8–5.6)

## 2012-05-14 ENCOUNTER — Ambulatory Visit (INDEPENDENT_AMBULATORY_CARE_PROVIDER_SITE_OTHER): Payer: Medicare Other | Admitting: Internal Medicine

## 2012-05-14 ENCOUNTER — Encounter: Payer: Self-pay | Admitting: Internal Medicine

## 2012-05-14 VITALS — BP 160/68 | HR 84 | Temp 98.6°F | Resp 22 | Ht 68.0 in | Wt 256.4 lb

## 2012-05-14 DIAGNOSIS — R609 Edema, unspecified: Secondary | ICD-10-CM

## 2012-05-14 DIAGNOSIS — G44209 Tension-type headache, unspecified, not intractable: Secondary | ICD-10-CM

## 2012-05-14 DIAGNOSIS — E1165 Type 2 diabetes mellitus with hyperglycemia: Secondary | ICD-10-CM | POA: Diagnosis not present

## 2012-05-14 DIAGNOSIS — E1129 Type 2 diabetes mellitus with other diabetic kidney complication: Secondary | ICD-10-CM | POA: Diagnosis not present

## 2012-05-14 DIAGNOSIS — I10 Essential (primary) hypertension: Secondary | ICD-10-CM | POA: Diagnosis not present

## 2012-05-14 DIAGNOSIS — R413 Other amnesia: Secondary | ICD-10-CM

## 2012-05-14 DIAGNOSIS — N039 Chronic nephritic syndrome with unspecified morphologic changes: Secondary | ICD-10-CM

## 2012-05-14 DIAGNOSIS — D631 Anemia in chronic kidney disease: Secondary | ICD-10-CM

## 2012-05-14 DIAGNOSIS — E785 Hyperlipidemia, unspecified: Secondary | ICD-10-CM

## 2012-05-14 NOTE — Patient Instructions (Signed)
Continue current medication.

## 2012-05-14 NOTE — Progress Notes (Signed)
Subjective:    Patient ID: Kirk [name], male    DOB: 25-Jan-1960, 52 y.o.   MRN: [id number]  HPI Right knee brace following injury from trying to cut up a fallen tree limb. Prior pain is better since using brace. Using tramadol and Tylenol Arthritis. Sees Dr. Lynder Parents at [facility].   [facility] neurosurgery for Cervical stenosis, Dr. Jinny Sanders, next week. May explain weakness of right side and the neck pains. Has had MRI of the back and the neck.  To see Dr. Marcello Fennel for check up of mental changes. Still has episodes of confusion. Last a few minutes. No syncope.   Current Outpatient Prescriptions on File Prior to Visit  Medication Sig Dispense Refill  . amLODipine (NORVASC) 10 MG tablet Take 10 mg by mouth daily.      Marland Kitchen aspirin EC 81 MG tablet Take 81 mg by mouth daily.      . Cholecalciferol 5000 UNITS TABS Take 5,000 Units by mouth every morning.      Marland Kitchen doxazosin (CARDURA) 2 MG tablet Take 2 mg by mouth 2 (two) times daily.       Marland Kitchen loratadine (CLARITIN) 10 MG tablet Take 10 mg by mouth daily.      . mycophenolate (MYFORTIC) 360 MG TBEC Take 360 mg by mouth 2 (two) times daily.      Marland Kitchen omeprazole (PRILOSEC) 20 MG capsule Take 20 mg by mouth every morning.      . pravastatin (PRAVACHOL) 40 MG tablet Take 40 mg by mouth every evening.      . predniSONE (DELTASONE) 5 MG tablet Take 5 mg by mouth daily.      . Probiotic Product (PROBIOTIC PO) Take 2 capsules by mouth 2 (two) times daily.      Marland Kitchen sulfamethoxazole-trimethoprim (BACTRIM DS) 800-160 MG per tablet Take 1 tablet by mouth 3 (three) times a week.      . tacrolimus (PROGRAF) 1 MG capsule Take 7 mg by mouth 2 (two) times daily.           Review of Systems  Constitutional: Negative for fever.       Chronically fatigued.  Eyes: Negative.   Respiratory: Positive for shortness of breath.        Dyspnea on exertion.  Cardiovascular: Negative for chest pain, palpitations and leg swelling.  Gastrointestinal: Negative for abdominal pain and  abdominal distention.  Endocrine:       Diabetes is controlled.  Genitourinary: Negative.        Renal failure and kidney transplant.  Musculoskeletal:       Neck discomfort. Chronic right knee discomfort. Chronic back pains. Unsteady gait.  Neurological:       Memory is worse. Feet feel numb.  Hematological:       Anemia of CKD.  Psychiatric/Behavioral: Positive for confusion and decreased concentration.       Insomnia. Falls asleep in the day.       Objective:BP 160/68  Pulse 84  Temp(Src) 98.6 F (37 C)  Resp 22  Ht 5\' 8"  (1.727 m)  Wt 256 lb 6.4 oz (116.302 kg)  BMI 38.99 kg/m2    Physical Exam  Constitutional: He is oriented to person, place, and time.  Obese.  HENT:  Loss of hearing.  Eyes:  Corrective lenses  Neck: No JVD present. No tracheal deviation present. No thyromegaly present.  Cardiovascular: Exam reveals no gallop.   No murmur heard. AF. 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  Abdominal: Soft. Bowel sounds are normal.  He exhibits no distension and no mass. There is no tenderness.  Musculoskeletal: Normal range of motion. He exhibits edema and tenderness.  Lymphadenopathy:    He has no cervical adenopathy.  Neurological: He is alert and oriented to person, place, and time. No cranial nerve deficit. Coordination normal.  Positive Tinel sign bilaterally  Skin: No rash noted. No erythema. No pallor.  Psychiatric:  Slow. Depressed acting.   Appointment on 05/12/2012  Component Date Value Range Status  . Glucose 05/12/2012 167* 65 - 99 mg/dL Final  . BUN 05/12/2012 20  6 - 24 mg/dL Final  . Creatinine, Ser 05/12/2012 1.37* 0.76 - 1.27 mg/dL Final  . GFR calc non Af Amer 05/12/2012 60  >59 mL/min/1.73 Final  . GFR calc Af Amer 05/12/2012 69  >59 mL/min/1.73 Final  . BUN/Creatinine Ratio 05/12/2012 15  9 - 20 Final  . Sodium 05/12/2012 140  134 - 144 mmol/L Final  . Potassium 05/12/2012 3.9  3.5 - 5.2 mmol/L Final  . Chloride 05/12/2012 104  97 - 108 mmol/L  Final  . CO2 05/12/2012 21  19 - 28 mmol/L Final  . Calcium 05/12/2012 9.1  8.7 - 10.2 mg/dL Final  . WBC 05/12/2012 4.5  3.4 - 10.8 x10E3/uL Final  . RBC 05/12/2012 4.22  4.14 - 5.80 x10E6/uL Final  . Hemoglobin 05/12/2012 12.9  12.6 - 17.7 g/dL Final  . HCT 05/12/2012 38.9  37.5 - 51.0 % Final  . MCV 05/12/2012 92  79 - 97 fL Final  . MCH 05/12/2012 30.6  26.6 - 33.0 pg Final  . MCHC 05/12/2012 33.2  31.5 - 35.7 g/dL Final  . RDW 05/12/2012 14.2  12.3 - 15.4 % Final  . Neutrophils Relative % 05/12/2012 49  40 - 74 % Final  . Lymphs 05/12/2012 36  14 - 46 % Final  . Monocytes 05/12/2012 12  4 - 12 % Final  . Eos 05/12/2012 3  0 - 5 % Final  . Basos 05/12/2012 0  0 - 3 % Final  . Neutrophils Absolute 05/12/2012 2.2  1.4 - 7.0 x10E3/uL Final  . Lymphocytes Absolute 05/12/2012 1.6  0.7 - 3.1 x10E3/uL Final  . Monocytes Absolute 05/12/2012 0.5  0.1 - 0.9 x10E3/uL Final  . Eosinophils Absolute 05/12/2012 0.1  0.0 - 0.4 x10E3/uL Final  . Basophils Absolute 05/12/2012 0.0  0.0 - 0.2 x10E3/uL Final  . Immature Granulocytes 05/12/2012 0  0 - 2 % Final  . Immature Grans (Abs) 05/12/2012 0.0  0.0 - 0.1 x10E3/uL Final  . Hemoglobin A1C 05/12/2012 7.4* 4.8 - 5.6 % Final   Comment:          Increased risk for diabetes: 5.7 - 6.4                                   Diabetes: >6.4                                   Glycemic control for adults with diabetes: <7.0  . Estimated average glucose 05/12/2012 166   Final       Assessment & Plan:  HTN (hypertension) - : controlled  Plan: CMP  Type II or unspecified type diabetes mellitus with renal manifestations, uncontrolled(250.42): better control   - Plan: Hemoglobin A1c  Tension headache: chronic and recurrent  Memory loss; to  see neurologist at [facility], Dr. Marcello Fennel  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): follow lab   - Plan: CBC with Differential  Other and unspecified hyperlipidemia ; follow lab:  - Plan: Lipid panel  Edema:  unchanged

## 2012-06-25 DIAGNOSIS — E7800 Pure hypercholesterolemia, unspecified: Secondary | ICD-10-CM | POA: Diagnosis present

## 2012-06-25 DIAGNOSIS — E669 Obesity, unspecified: Secondary | ICD-10-CM | POA: Diagnosis present

## 2012-06-25 DIAGNOSIS — N189 Chronic kidney disease, unspecified: Secondary | ICD-10-CM | POA: Diagnosis present

## 2012-06-25 DIAGNOSIS — E875 Hyperkalemia: Secondary | ICD-10-CM | POA: Diagnosis present

## 2012-06-25 DIAGNOSIS — E119 Type 2 diabetes mellitus without complications: Secondary | ICD-10-CM | POA: Diagnosis present

## 2012-06-25 DIAGNOSIS — N2581 Secondary hyperparathyroidism of renal origin: Secondary | ICD-10-CM | POA: Diagnosis present

## 2012-06-25 DIAGNOSIS — Z6836 Body mass index (BMI) 36.0-36.9, adult: Secondary | ICD-10-CM | POA: Diagnosis not present

## 2012-06-25 DIAGNOSIS — J449 Chronic obstructive pulmonary disease, unspecified: Secondary | ICD-10-CM | POA: Diagnosis present

## 2012-06-25 DIAGNOSIS — T8610 Unspecified complication of kidney transplant: Secondary | ICD-10-CM | POA: Diagnosis not present

## 2012-06-25 DIAGNOSIS — G4733 Obstructive sleep apnea (adult) (pediatric): Secondary | ICD-10-CM | POA: Diagnosis present

## 2012-06-25 DIAGNOSIS — M4712 Other spondylosis with myelopathy, cervical region: Secondary | ICD-10-CM | POA: Diagnosis present

## 2012-06-25 DIAGNOSIS — N170 Acute kidney failure with tubular necrosis: Secondary | ICD-10-CM | POA: Diagnosis not present

## 2012-06-25 DIAGNOSIS — I129 Hypertensive chronic kidney disease with stage 1 through stage 4 chronic kidney disease, or unspecified chronic kidney disease: Secondary | ICD-10-CM | POA: Diagnosis present

## 2012-06-25 HISTORY — PX: POSTERIOR FUSION CERVICAL SPINE: SUR628

## 2012-06-27 ENCOUNTER — Telehealth: Payer: Self-pay | Admitting: *Deleted

## 2012-06-27 NOTE — Telephone Encounter (Signed)
Patient had back surgery Wednesday 06/25/2012 and is having complications from it. Wife Stating kidney is possibly failing. 6.6 Creatinine and 22 BUN. Patient is currently at [facility] in [place]. They have placed him on steroids to see if that is going to help. Mechele Claude just wanted to make you aware.

## 2012-07-02 ENCOUNTER — Telehealth: Payer: Self-pay | Admitting: *Deleted

## 2012-07-02 NOTE — Telephone Encounter (Signed)
Wife, [name], called and wanted to give you an update. Hospital has started patient on Ret-G through PICC line--first dose last night, dose today and 2 more. Has caused patient to have diarrhea and chills. Creatinine has come down to 5.0. Dr. Quintella Baton that the surgery didn't have anything to do with the kidney, but [name] thinks otherwise.

## 2012-07-08 ENCOUNTER — Encounter: Payer: Self-pay | Admitting: Nurse Practitioner

## 2012-07-08 ENCOUNTER — Ambulatory Visit (INDEPENDENT_AMBULATORY_CARE_PROVIDER_SITE_OTHER): Payer: Managed Care, Other (non HMO) | Admitting: Nurse Practitioner

## 2012-07-08 ENCOUNTER — Encounter: Payer: Self-pay | Admitting: *Deleted

## 2012-07-08 VITALS — BP 152/62 | HR 74 | Temp 98.8°F | Resp 18 | Ht 68.0 in | Wt 243.8 lb

## 2012-07-08 DIAGNOSIS — N186 End stage renal disease: Secondary | ICD-10-CM

## 2012-07-08 DIAGNOSIS — J45909 Unspecified asthma, uncomplicated: Secondary | ICD-10-CM

## 2012-07-08 DIAGNOSIS — I10 Essential (primary) hypertension: Secondary | ICD-10-CM

## 2012-07-08 DIAGNOSIS — M4802 Spinal stenosis, cervical region: Secondary | ICD-10-CM

## 2012-07-08 DIAGNOSIS — R609 Edema, unspecified: Secondary | ICD-10-CM

## 2012-07-08 MED ORDER — IPRATROPIUM-ALBUTEROL 18-103 MCG/ACT IN AERO: 2.0000 | INHALATION_SPRAY | Freq: Four times a day (QID) | RESPIRATORY_TRACT | Status: DC | PRN

## 2012-07-08 MED ORDER — RENA-VITE PO TABS: 1.0000 | ORAL_TABLET | Freq: Every day | ORAL | Status: DC

## 2012-07-08 NOTE — Progress Notes (Signed)
Patient ID: Kirk [name], male   DOB: July 26, 1960, 52 y.o.   MRN: [id number]   Allergies  Allergen Reactions  . Codeine Nausea And Vomiting    Chief Complaint  Patient presents with  . Hospitalization Follow-up    HPI: Patient is a 52 y.o. male seen in the office today for hospital follow up. Went to [facility] for neurosurgery for Cervical stenosis s/p anterior cervical diskectomy and fusion and subsequently went into acute kidney failure was hospitalized from 06/27/12- 07/04/12. --- history from wife. Records have not been received at this time. Reviewed discharge packet.  He was started on lasix 60 mg daily, hydralazine 75 mg every 6 hours, valcyte 450 mg every Monday, Wednesday, and Friday  Pt has a Kidney specialist transplant appt tomorrow at [facility]. Pt had lab work done yesterday per kidney specialist and they are repeating lab work tomorrow. Following up with surgeon and nephrology.   Everything been going good at home. Still going to appts frequently   Review of Systems:  Review of Systems  Constitutional: Positive for malaise/fatigue. Negative for fever and chills.  HENT: Positive for neck pain.   Eyes: Negative for blurred vision and double vision.  Respiratory: Negative for cough and shortness of breath.   Cardiovascular: Negative for chest pain, palpitations and leg swelling.  Gastrointestinal: Negative for abdominal pain, diarrhea and constipation.  Genitourinary: Positive for frequency. Negative for dysuria, urgency and hematuria.  Neurological: Positive for weakness. Negative for dizziness and headaches.  Psychiatric/Behavioral: The patient has insomnia.      Past Medical History  Diagnosis Date  . Anxiety   . Chronic headaches   . Chronic kidney disease (CKD)   . Dialysis care 02/06/2010  . HTN (hypertension)   . Sleep apnea     wears the CPAP  . Type II or unspecified type diabetes mellitus with renal manifestations, uncontrolled(250.42)   . Shortness of breath   .  Impotence of organic origin   . Other malaise and fatigue   . Debility, unspecified   . Cellulitis and abscess of trunk   . Kidney replaced by transplant   . Acute edema of lung, unspecified   . Unspecified constipation   . Other chronic nonalcoholic liver disease   . Localization-related (focal) (partial) epilepsy and epileptic syndromes with complex partial seizures, without mention of intractable epilepsy   . Tension headache   . Dermatophytosis of the body   . Gout, unspecified   . Pain in joint, upper arm   . Pain in joint, lower leg   . Other nonspecific abnormal serum enzyme levels   . Essential hypertension, benign   . Acute, but ill-defined, cerebrovascular disease   . Memory loss   . Hypotension, unspecified   . Hypertrophy of prostate without urinary obstruction and other lower urinary tract symptoms (LUTS)   . Renal dialysis status(V45.11)   . End stage renal disease   . Insomnia, unspecified   . Secondary hyperparathyroidism (of renal origin)   . Lumbago   . Unspecified vitamin D deficiency   . Carpal tunnel syndrome   . 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)   . Unspecified hereditary and idiopathic peripheral neuropathy   . Other and unspecified hyperlipidemia   . Unspecified essential hypertension   . Arteriovenous fistula, acquired   . Edema   . Vasectomy status   . Obstructive sleep apnea (adult) (pediatric)    Past Surgical History  Procedure Laterality Date  . Inner ear surgery  [phone]  for deafness at age 26  . Colonoscopy    . Dialysis shunt placement    . Kidney transplant  08/17/2011    [facility]   . Placement of fistula at [facility] vein specialist     Social History:   reports that he quit smoking about 11 months ago. His smoking use included Cigarettes. He smoked 0.00 packs per day for 28 years. He has never used smokeless tobacco. He reports that he does not drink alcohol or use illicit drugs.  Family History  Problem Relation Age of  Onset  . Colon cancer Neg Hx   . Esophageal cancer Neg Hx   . Rectal cancer Neg Hx   . Stomach cancer Neg Hx     Medications: Patient's Medications  New Prescriptions   No medications on file  Previous Medications   AMLODIPINE (NORVASC) 10 MG TABLET    Take 10 mg by mouth daily.   ASPIRIN EC 81 MG TABLET    Take 81 mg by mouth daily.   CARVEDILOL (COREG) 6.25 MG TABLET    Take 25 mg by mouth 2 (two) times daily with a meal.    CHOLECALCIFEROL 5000 UNITS TABS    Take 5,000 Units by mouth every morning.   DOXAZOSIN (CARDURA) 2 MG TABLET    Take 2 mg by mouth 2 (two) times daily.    FUROSEMIDE (LASIX) 20 MG TABLET    Take 20 mg by mouth 3 (three) times daily.   HYDRALAZINE (APRESOLINE) 25 MG TABLET    Take 25 mg by mouth 3 (three) times daily. Take 3 tabs every 6 hours by mouth   LORATADINE (CLARITIN) 10 MG TABLET    Take 10 mg by mouth daily.   MULTIVITAMIN (RENA-VIT) TABS TABLET    Take 1 tablet by mouth daily.   MYCOPHENOLATE (MYFORTIC) 360 MG TBEC    Take 360 mg by mouth 2 (two) times daily.   OMEPRAZOLE (PRILOSEC) 20 MG CAPSULE    Take 20 mg by mouth every morning.   PRAVASTATIN (PRAVACHOL) 40 MG TABLET    Take 40 mg by mouth every evening.   PREDNISONE (DELTASONE) 5 MG TABLET    Take 5 mg by mouth daily.   PROBIOTIC PRODUCT (PROBIOTIC PO)    Take 2 capsules by mouth 2 (two) times daily.   SULFAMETHOXAZOLE-TRIMETHOPRIM (BACTRIM DS) 800-160 MG PER TABLET    Take 1 tablet by mouth 3 (three) times a week.   TACROLIMUS (PROGRAF) 1 MG CAPSULE    Take 7 mg by mouth 2 (two) times daily.   TRAMADOL (ULTRAM) 50 MG TABLET    Take 50 mg by mouth every 6 (six) hours as needed for pain. Take one tablet every 6 hours as needed for pain   VALGANCICLOVIR (VALCYTE) 450 MG TABLET    Take 450 mg by mouth daily. Take 1 tablet Mon, Wed, Fri.  Modified Medications   No medications on file  Discontinued Medications   No medications on file     Physical Exam:  Filed Vitals:   07/08/12 1129  BP:  152/62  Pulse: 74  Temp: 98.8 F (37.1 C)  TempSrc: Oral  Resp: 18  Height: 5\' 8"  (1.727 m)  Weight: 243 lb 12.8 oz (110.587 kg)  SpO2: 98%    Physical Exam  Constitutional: He is oriented to person, place, and time and well-developed, well-nourished, and in no distress. No distress.  HENT:  Head: Normocephalic and atraumatic.  Eyes: Conjunctivae and EOM are normal. Pupils  are equal, round, and reactive to light.  Neck: Neck supple.  Right side has anterior incision from procedure no sings of swelling or infection   Cardiovascular: Normal rate, regular rhythm, normal heart sounds and intact distal pulses.   Pulmonary/Chest: Effort normal.  deminished breath sounds throughout  Abdominal: Soft. Bowel sounds are normal. He exhibits no distension. There is no tenderness.  Musculoskeletal: He exhibits no edema and no tenderness.  Neurological: He is alert and oriented to person, place, and time.  Skin: Skin is warm and dry. He is not diaphoretic.     Labs reviewed: Basic Metabolic Panel:  Recent Labs  08/05/11 0530  08/07/11 0943 08/09/11 0700 01/31/12 1628 05/12/12 1158  NA 136  < > 137 136 141 140  K 4.1  < > 3.6 4.4 4.1 3.9  CL 93*  < > 90* 94* 105 104  CO2 26  < > 22 21 22 21   GLUCOSE 198*  < > 133* 117* 184* 167*  BUN 33*  < > 99* 90* 18 20  CREATININE 8.04*  < > 14.07* 12.37* 1.91* 1.37*  CALCIUM 10.4  < > 10.5 9.6 9.4 9.1  MG 2.3  --   --   --   --   --   PHOS 2.6  --  6.6* 3.0  --   --   < > = values in this interval not displayed. Liver Function Tests:  Recent Labs  08/07/11 0943 08/09/11 0700  ALBUMIN 3.5 3.4*   No results found for this basename: LIPASE, AMYLASE,  in the last 8760 hours No results found for this basename: AMMONIA,  in the last 8760 hours CBC:  Recent Labs  08/07/11 0942 08/09/11 0659 01/31/12 1628 05/12/12 1158  WBC 7.4 8.9 5.1 4.5  NEUTROABS  --   --   --  2.2  HGB 10.5* 10.7* 12.6* 12.9  HCT 30.6* 31.1* 35.7* 38.9  MCV  94.2 94.5 88.1 92  PLT 113* 117* 112*  --       Assessment/Plan  1.   HTN (hypertension) 401.9     Patients hypertension is stable; continue current regimen.    2.   Edema 782.3     Improved with increase lasix   3.   ESRD (end stage renal disease) 585.6      s/p transplant and now acute renal insufficieny pt having labs done by renal- pt going to visit  tomorrow- wife to request records to be fwd to this clinic    4.   Asthma, unspecified asthma severity, uncomplicated   Pt was with worsening shortness of breath- no with increase lasix and PRN combivent  breathing has improved- pt last saw pulmonary 1 year ago in July- encouraged them to  follow  up    5. Cervical stenosis-   S/p cervical diskectomy and fusion. At this time reports pain has not changed since surgery- to  cont PRN tramadol and follow-up with neurology To request hospital and follow up records

## 2012-07-23 ENCOUNTER — Other Ambulatory Visit: Payer: Self-pay | Admitting: *Deleted

## 2012-07-23 MED ORDER — CARVEDILOL 25 MG PO TABS: ORAL_TABLET | ORAL | Status: DC

## 2012-07-31 DIAGNOSIS — R197 Diarrhea, unspecified: Secondary | ICD-10-CM | POA: Insufficient documentation

## 2012-08-12 ENCOUNTER — Other Ambulatory Visit: Payer: Self-pay | Admitting: Geriatric Medicine

## 2012-08-12 MED ORDER — PRAVASTATIN SODIUM 40 MG PO TABS: 40.0000 mg | ORAL_TABLET | Freq: Every evening | ORAL | Status: DC

## 2012-09-03 DIAGNOSIS — D649 Anemia, unspecified: Secondary | ICD-10-CM | POA: Insufficient documentation

## 2012-09-09 ENCOUNTER — Encounter: Payer: Self-pay | Admitting: Internal Medicine

## 2012-09-09 ENCOUNTER — Ambulatory Visit (INDEPENDENT_AMBULATORY_CARE_PROVIDER_SITE_OTHER): Payer: Managed Care, Other (non HMO) | Admitting: Internal Medicine

## 2012-09-09 VITALS — BP 140/60 | HR 74 | Temp 98.6°F | Resp 16 | Ht 68.0 in | Wt 242.6 lb

## 2012-09-09 DIAGNOSIS — E669 Obesity, unspecified: Secondary | ICD-10-CM

## 2012-09-09 DIAGNOSIS — T8610 Unspecified complication of kidney transplant: Secondary | ICD-10-CM

## 2012-09-09 DIAGNOSIS — E1129 Type 2 diabetes mellitus with other diabetic kidney complication: Secondary | ICD-10-CM

## 2012-09-09 DIAGNOSIS — I10 Essential (primary) hypertension: Secondary | ICD-10-CM

## 2012-09-09 DIAGNOSIS — R609 Edema, unspecified: Secondary | ICD-10-CM

## 2012-09-09 DIAGNOSIS — Z2989 Encounter for other specified prophylactic measures: Secondary | ICD-10-CM

## 2012-09-09 DIAGNOSIS — G4733 Obstructive sleep apnea (adult) (pediatric): Secondary | ICD-10-CM

## 2012-09-09 DIAGNOSIS — R197 Diarrhea, unspecified: Secondary | ICD-10-CM

## 2012-09-09 DIAGNOSIS — Z298 Encounter for other specified prophylactic measures: Secondary | ICD-10-CM

## 2012-09-09 DIAGNOSIS — E1165 Type 2 diabetes mellitus with hyperglycemia: Secondary | ICD-10-CM

## 2012-09-09 DIAGNOSIS — D631 Anemia in chronic kidney disease: Secondary | ICD-10-CM

## 2012-09-09 NOTE — Progress Notes (Signed)
Subjective:    Patient ID: Kirk [name], male    DOB: Jul 27, 1960, 52 y.o.   MRN: [id number]  Chief Complaint  Patient presents with  . Follow-up    HPI  Since I last saw him, he has been hospitalized at [facility] at least twice. Had neck surgery for cervical spine stenosis. Is on bone stimulator until at least Jan 2015. Was rehospitalized  for ARF. Was given steroids and fluids and improved. Creatinine stabilized about 5-6.  Diarrhea is better. He was diagnosed with cryptosporidiosis and C. Difficile.  Continues to complain of generalized weakness and stays in bed all day.  Continues with right knee pain. No erythema or swelling.  Continues with persistent but intermittent nausea.  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  -Delayed graft function. Bx 8/1 possible ACR, so received IV steroids.  - complicated by ACR in the setting of neck surgery June 2014; Ib rejection, treated with  steroids & then thymo.  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 albuterol-ipratropium (COMBIVENT) 18-103 MCG/ACT inhaler Inhale 2 puffs into the lungs every 6 (six) hours as needed for wheezing.  14.7 g  3  . amLODipine (NORVASC) 10 MG tablet Take 10 mg by mouth daily.      Marland Kitchen aspirin EC 81 MG tablet Take 81 mg by mouth daily.      . carvedilol (COREG) 25 MG tablet Take one tablet in the morning and  one tablet in the evening  180 tablet  3  . Cholecalciferol 5000 UNITS TABS Take 5,000 Units by mouth every morning.      . hydrALAZINE (APRESOLINE) 25 MG tablet Take 25 mg by mouth 3 (three) times daily. Take 3 tabs every 6 hours by mouth      . loratadine (CLARITIN) 10 MG tablet Take 10 mg by mouth daily.      . mycophenolate (MYFORTIC) 360 MG TBEC Take 360 mg by mouth 2 (two) times daily. Take 360mg  in the am and 180 mg in the pm.      . omeprazole (PRILOSEC) 20 MG capsule Take 20 mg by mouth every morning.      . predniSONE (DELTASONE) 5 MG tablet Take 5 mg by mouth daily.      . Probiotic Product (PROBIOTIC PO) Take 2 capsules by mouth 2 (two) times daily.      Marland Kitchen sulfamethoxazole-trimethoprim (BACTRIM DS) 800-160 MG per tablet Take 1 tablet by mouth 3 (three) times a week.      . tacrolimus (PROGRAF) 1 MG capsule Take 7 mg by mouth 2 (two) times daily.      . traMADol (ULTRAM) 50 MG tablet Take 50 mg by mouth every 6 (six) hours as needed for pain. Take one tablet every 6 hours as needed for pain       No current facility-administered medications on file prior to visit.    Review of Systems  Constitutional: Negative for fever.       Chronically fatigued.  Eyes: Negative.   Respiratory: Positive for shortness of breath.        Dyspnea on exertion.  Cardiovascular: Negative for chest pain, palpitations and leg swelling.  Gastrointestinal: Negative for abdominal pain and abdominal distention.  Endocrine:       Diabetes is controlled.  Genitourinary: Negative.        Renal failure and kidney transplant.  Musculoskeletal:       Neck discomfort. Chronic right knee discomfort. Chronic back pains. Unsteady gait.  Neurological:       Memory is worse. Feet feel numb.  Hematological:       Anemia of CKD.  Psychiatric/Behavioral: Positive for confusion and decreased concentration.       Insomnia. Falls asleep in the day.       Objective:BP 140/60  Pulse 74  Temp(Src) 98.6 F (37 C)  (Oral)  Resp 16  Ht 5\' 8"  (1.727 m)  Wt 242 lb 9.6 oz (110.043 kg)  BMI 36.90 kg/m2  SpO2 98%    Physical Exam  Constitutional: He is oriented to person, place, and time.  Obese.  HENT:  Loss of hearing.  Eyes:  Corrective lenses  Neck: No JVD present. No tracheal deviation present. No thyromegaly present.  Cardiovascular: Exam reveals no gallop.   No murmur heard. AF. 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  Abdominal: Soft. Bowel sounds are normal. He exhibits no distension and no mass. There is no tenderness.  Musculoskeletal: Normal range of motion. He exhibits edema and tenderness.  Scar left knee. Tender right knee  Lymphadenopathy:    He has no cervical adenopathy.  Neurological: He is alert and oriented to person, place, and time. No cranial nerve deficit. Coordination normal.  Positive Tinel sign bilaterally  Skin: No rash noted. No erythema. No pallor.  Psychiatric:  Slow. Depressed acting.     No visits with results within 3 Month(s) from this visit. Latest known visit with results is:  Appointment on 05/12/2012  Component Date Value Range Status  . Glucose 05/12/2012 167* 65 - 99 mg/dL Final  . BUN 05/12/2012 20  6 - 24 mg/dL Final  . Creatinine, Ser 05/12/2012 1.37* 0.76 - 1.27 mg/dL Final  . GFR calc non Af Amer 05/12/2012 60  >59 mL/min/1.73 Final  . GFR calc Af Amer 05/12/2012 69  >59 mL/min/1.73 Final  . BUN/Creatinine Ratio 05/12/2012 15  9 - 20 Final  . Sodium 05/12/2012 140  134 - 144 mmol/L Final  . Potassium 05/12/2012 3.9  3.5 - 5.2 mmol/L Final  . Chloride 05/12/2012 104  97 - 108 mmol/L Final  . CO2 05/12/2012 21  19 - 28 mmol/L Final  . Calcium 05/12/2012 9.1  8.7 - 10.2 mg/dL Final  . WBC 05/12/2012 4.5  3.4 - 10.8 x10E3/uL Final  . RBC 05/12/2012 4.22  4.14 - 5.80 x10E6/uL Final  . Hemoglobin 05/12/2012 12.9  12.6 - 17.7 g/dL Final  . HCT 05/12/2012 38.9  37.5 - 51.0 % Final  . MCV 05/12/2012 92  79 - 97 fL Final  . MCH 05/12/2012 30.6  26.6 - 33.0  pg Final  . MCHC 05/12/2012 33.2  31.5 - 35.7 g/dL Final  . RDW 05/12/2012 14.2  12.3 - 15.4 % Final  . Neutrophils Relative % 05/12/2012 49  40 - 74 % Final  . Lymphs 05/12/2012 36  14 - 46 % Final  . Monocytes 05/12/2012 12  4 - 12 % Final  . Eos 05/12/2012 3  0 - 5 %  Final  . Basos 05/12/2012 0  0 - 3 % Final  . Neutrophils Absolute 05/12/2012 2.2  1.4 - 7.0 x10E3/uL Final  . Lymphocytes Absolute 05/12/2012 1.6  0.7 - 3.1 x10E3/uL Final  . Monocytes Absolute 05/12/2012 0.5  0.1 - 0.9 x10E3/uL Final  . Eosinophils Absolute 05/12/2012 0.1  0.0 - 0.4 x10E3/uL Final  . Basophils Absolute 05/12/2012 0.0  0.0 - 0.2 x10E3/uL Final  . Immature Granulocytes 05/12/2012 0  0 - 2 % Final  . Immature Grans (Abs) 05/12/2012 0.0  0.0 - 0.1 x10E3/uL Final  . Hemoglobin A1C 05/12/2012 7.4* 4.8 - 5.6 % Final   Comment:          Increased risk for diabetes: 5.7 - 6.4                                   Diabetes: >6.4                                   Glycemic control for adults with diabetes: <7.0  . Estimated average glucose 05/12/2012 166   Final        Assessment & Plan:  Type II or unspecified type diabetes mellitus with renal manifestations, uncontrolled(250.42): controlled  Anemia in chronic kidney [id number]): observe  Complication of transplanted kidney: recent acute renal failure and concern for rejection of transplant  Diarrhea of presumed infectious origin: improved  Edema: 1+ bipedal  HTN (hypertension): controlled  Need for prophylactic immunotherapy: due to renal transplant  Obesity: no significant weight loss despite recent severe illness and hospitalizations.  OSA on CPAP: continue to use

## 2012-09-09 NOTE — Patient Instructions (Signed)
Continue medications as instructed by your nephrologists.

## 2012-09-24 DIAGNOSIS — Z029 Encounter for administrative examinations, unspecified: Secondary | ICD-10-CM

## 2012-10-15 DIAGNOSIS — D649 Anemia, unspecified: Secondary | ICD-10-CM | POA: Diagnosis not present

## 2012-10-29 DIAGNOSIS — T8610 Unspecified complication of kidney transplant: Secondary | ICD-10-CM | POA: Diagnosis not present

## 2012-10-29 DIAGNOSIS — IMO0002 Reserved for concepts with insufficient information to code with codable children: Secondary | ICD-10-CM | POA: Diagnosis not present

## 2012-10-29 DIAGNOSIS — N289 Disorder of kidney and ureter, unspecified: Secondary | ICD-10-CM | POA: Diagnosis not present

## 2012-10-29 DIAGNOSIS — Z79899 Other long term (current) drug therapy: Secondary | ICD-10-CM | POA: Diagnosis not present

## 2012-10-29 DIAGNOSIS — D649 Anemia, unspecified: Secondary | ICD-10-CM | POA: Diagnosis not present

## 2012-10-29 DIAGNOSIS — Z792 Long term (current) use of antibiotics: Secondary | ICD-10-CM | POA: Diagnosis not present

## 2012-10-29 DIAGNOSIS — Z23 Encounter for immunization: Secondary | ICD-10-CM | POA: Diagnosis not present

## 2012-10-29 DIAGNOSIS — R197 Diarrhea, unspecified: Secondary | ICD-10-CM | POA: Diagnosis not present

## 2012-11-07 ENCOUNTER — Other Ambulatory Visit: Payer: Self-pay | Admitting: Nurse Practitioner

## 2012-11-11 ENCOUNTER — Other Ambulatory Visit: Payer: Self-pay | Admitting: Nurse Practitioner

## 2012-11-12 DIAGNOSIS — D649 Anemia, unspecified: Secondary | ICD-10-CM | POA: Diagnosis not present

## 2012-11-17 ENCOUNTER — Other Ambulatory Visit: Payer: Self-pay | Admitting: *Deleted

## 2012-11-17 MED ORDER — PRAVASTATIN SODIUM 40 MG PO TABS: ORAL_TABLET | ORAL | Status: DC

## 2012-11-17 MED ORDER — DOXAZOSIN MESYLATE 2 MG PO TABS: 2.0000 mg | ORAL_TABLET | Freq: Two times a day (BID) | ORAL | Status: DC

## 2012-11-26 DIAGNOSIS — D649 Anemia, unspecified: Secondary | ICD-10-CM | POA: Diagnosis not present

## 2012-12-24 DIAGNOSIS — I10 Essential (primary) hypertension: Secondary | ICD-10-CM | POA: Diagnosis not present

## 2012-12-24 DIAGNOSIS — E538 Deficiency of other specified B group vitamins: Secondary | ICD-10-CM | POA: Diagnosis present

## 2012-12-24 DIAGNOSIS — N2581 Secondary hyperparathyroidism of renal origin: Secondary | ICD-10-CM | POA: Diagnosis present

## 2012-12-24 DIAGNOSIS — Y33XXXA Other specified events, undetermined intent, initial encounter: Secondary | ICD-10-CM | POA: Diagnosis not present

## 2012-12-24 DIAGNOSIS — D649 Anemia, unspecified: Secondary | ICD-10-CM | POA: Diagnosis not present

## 2012-12-24 DIAGNOSIS — M259 Joint disorder, unspecified: Secondary | ICD-10-CM | POA: Diagnosis not present

## 2012-12-24 DIAGNOSIS — R609 Edema, unspecified: Secondary | ICD-10-CM | POA: Diagnosis not present

## 2012-12-24 DIAGNOSIS — Z6839 Body mass index (BMI) 39.0-39.9, adult: Secondary | ICD-10-CM | POA: Diagnosis not present

## 2012-12-24 DIAGNOSIS — Z940 Kidney transplant status: Secondary | ICD-10-CM | POA: Diagnosis not present

## 2012-12-24 DIAGNOSIS — N19 Unspecified kidney failure: Secondary | ICD-10-CM | POA: Diagnosis not present

## 2012-12-24 DIAGNOSIS — E669 Obesity, unspecified: Secondary | ICD-10-CM | POA: Diagnosis present

## 2012-12-24 DIAGNOSIS — S8990XA Unspecified injury of unspecified lower leg, initial encounter: Secondary | ICD-10-CM | POA: Diagnosis not present

## 2012-12-24 DIAGNOSIS — D6959 Other secondary thrombocytopenia: Secondary | ICD-10-CM | POA: Diagnosis present

## 2012-12-24 DIAGNOSIS — G4733 Obstructive sleep apnea (adult) (pediatric): Secondary | ICD-10-CM | POA: Diagnosis not present

## 2012-12-24 DIAGNOSIS — N186 End stage renal disease: Secondary | ICD-10-CM | POA: Diagnosis not present

## 2012-12-24 DIAGNOSIS — Z992 Dependence on renal dialysis: Secondary | ICD-10-CM | POA: Diagnosis not present

## 2012-12-24 DIAGNOSIS — E119 Type 2 diabetes mellitus without complications: Secondary | ICD-10-CM | POA: Diagnosis not present

## 2012-12-24 DIAGNOSIS — T8610 Unspecified complication of kidney transplant: Secondary | ICD-10-CM | POA: Diagnosis not present

## 2012-12-24 DIAGNOSIS — E7800 Pure hypercholesterolemia, unspecified: Secondary | ICD-10-CM | POA: Diagnosis present

## 2012-12-24 DIAGNOSIS — D631 Anemia in chronic kidney disease: Secondary | ICD-10-CM | POA: Diagnosis present

## 2012-12-24 DIAGNOSIS — I120 Hypertensive chronic kidney disease with stage 5 chronic kidney disease or end stage renal disease: Secondary | ICD-10-CM | POA: Diagnosis present

## 2013-01-01 DIAGNOSIS — N186 End stage renal disease: Secondary | ICD-10-CM | POA: Diagnosis not present

## 2013-01-01 DIAGNOSIS — N2581 Secondary hyperparathyroidism of renal origin: Secondary | ICD-10-CM | POA: Diagnosis not present

## 2013-01-01 DIAGNOSIS — D631 Anemia in chronic kidney disease: Secondary | ICD-10-CM | POA: Diagnosis not present

## 2013-01-03 DIAGNOSIS — N186 End stage renal disease: Secondary | ICD-10-CM | POA: Diagnosis not present

## 2013-01-03 DIAGNOSIS — N2581 Secondary hyperparathyroidism of renal origin: Secondary | ICD-10-CM | POA: Diagnosis not present

## 2013-01-03 DIAGNOSIS — D631 Anemia in chronic kidney disease: Secondary | ICD-10-CM | POA: Diagnosis not present

## 2013-01-05 ENCOUNTER — Other Ambulatory Visit: Payer: Self-pay | Admitting: Internal Medicine

## 2013-01-05 ENCOUNTER — Encounter: Payer: Self-pay | Admitting: Internal Medicine

## 2013-01-06 DIAGNOSIS — N186 End stage renal disease: Secondary | ICD-10-CM | POA: Diagnosis not present

## 2013-01-06 DIAGNOSIS — N2581 Secondary hyperparathyroidism of renal origin: Secondary | ICD-10-CM | POA: Diagnosis not present

## 2013-01-06 DIAGNOSIS — D631 Anemia in chronic kidney disease: Secondary | ICD-10-CM | POA: Diagnosis not present

## 2013-01-07 ENCOUNTER — Ambulatory Visit: Payer: Managed Care, Other (non HMO) | Admitting: Internal Medicine

## 2013-01-08 DIAGNOSIS — N186 End stage renal disease: Secondary | ICD-10-CM | POA: Diagnosis not present

## 2013-01-08 DIAGNOSIS — N2581 Secondary hyperparathyroidism of renal origin: Secondary | ICD-10-CM | POA: Diagnosis not present

## 2013-01-08 DIAGNOSIS — D631 Anemia in chronic kidney disease: Secondary | ICD-10-CM | POA: Diagnosis not present

## 2013-01-10 DIAGNOSIS — N186 End stage renal disease: Secondary | ICD-10-CM | POA: Diagnosis not present

## 2013-01-10 DIAGNOSIS — D631 Anemia in chronic kidney disease: Secondary | ICD-10-CM | POA: Diagnosis not present

## 2013-01-10 DIAGNOSIS — N2581 Secondary hyperparathyroidism of renal origin: Secondary | ICD-10-CM | POA: Diagnosis not present

## 2013-01-12 DIAGNOSIS — N186 End stage renal disease: Secondary | ICD-10-CM | POA: Diagnosis not present

## 2013-01-12 DIAGNOSIS — N2581 Secondary hyperparathyroidism of renal origin: Secondary | ICD-10-CM | POA: Diagnosis not present

## 2013-01-12 DIAGNOSIS — D631 Anemia in chronic kidney disease: Secondary | ICD-10-CM | POA: Diagnosis not present

## 2013-01-14 DIAGNOSIS — D631 Anemia in chronic kidney disease: Secondary | ICD-10-CM | POA: Diagnosis not present

## 2013-01-14 DIAGNOSIS — N186 End stage renal disease: Secondary | ICD-10-CM | POA: Diagnosis not present

## 2013-01-14 DIAGNOSIS — N2581 Secondary hyperparathyroidism of renal origin: Secondary | ICD-10-CM | POA: Diagnosis not present

## 2013-01-17 DIAGNOSIS — N2581 Secondary hyperparathyroidism of renal origin: Secondary | ICD-10-CM | POA: Diagnosis not present

## 2013-01-17 DIAGNOSIS — D631 Anemia in chronic kidney disease: Secondary | ICD-10-CM | POA: Diagnosis not present

## 2013-01-17 DIAGNOSIS — N186 End stage renal disease: Secondary | ICD-10-CM | POA: Diagnosis not present

## 2013-01-19 DIAGNOSIS — D631 Anemia in chronic kidney disease: Secondary | ICD-10-CM | POA: Diagnosis not present

## 2013-01-19 DIAGNOSIS — N186 End stage renal disease: Secondary | ICD-10-CM | POA: Diagnosis not present

## 2013-01-19 DIAGNOSIS — N2581 Secondary hyperparathyroidism of renal origin: Secondary | ICD-10-CM | POA: Diagnosis not present

## 2013-01-21 DIAGNOSIS — N186 End stage renal disease: Secondary | ICD-10-CM | POA: Diagnosis not present

## 2013-01-21 DIAGNOSIS — N2581 Secondary hyperparathyroidism of renal origin: Secondary | ICD-10-CM | POA: Diagnosis not present

## 2013-01-21 DIAGNOSIS — D631 Anemia in chronic kidney disease: Secondary | ICD-10-CM | POA: Diagnosis not present

## 2013-01-24 DIAGNOSIS — N039 Chronic nephritic syndrome with unspecified morphologic changes: Secondary | ICD-10-CM | POA: Diagnosis not present

## 2013-01-24 DIAGNOSIS — N2581 Secondary hyperparathyroidism of renal origin: Secondary | ICD-10-CM | POA: Diagnosis not present

## 2013-01-24 DIAGNOSIS — N186 End stage renal disease: Secondary | ICD-10-CM | POA: Diagnosis not present

## 2013-01-24 DIAGNOSIS — Z23 Encounter for immunization: Secondary | ICD-10-CM | POA: Diagnosis not present

## 2013-01-24 DIAGNOSIS — D631 Anemia in chronic kidney disease: Secondary | ICD-10-CM | POA: Diagnosis not present

## 2013-01-27 DIAGNOSIS — N2581 Secondary hyperparathyroidism of renal origin: Secondary | ICD-10-CM | POA: Diagnosis not present

## 2013-01-27 DIAGNOSIS — Z23 Encounter for immunization: Secondary | ICD-10-CM | POA: Diagnosis not present

## 2013-01-27 DIAGNOSIS — D631 Anemia in chronic kidney disease: Secondary | ICD-10-CM | POA: Diagnosis not present

## 2013-01-27 DIAGNOSIS — N186 End stage renal disease: Secondary | ICD-10-CM | POA: Diagnosis not present

## 2013-01-28 ENCOUNTER — Encounter: Payer: Self-pay | Admitting: Internal Medicine

## 2013-01-28 ENCOUNTER — Ambulatory Visit (INDEPENDENT_AMBULATORY_CARE_PROVIDER_SITE_OTHER): Payer: Medicare Other | Admitting: Internal Medicine

## 2013-01-28 VITALS — BP 142/78 | HR 77 | Temp 98.6°F | Resp 18 | Ht 68.0 in | Wt 237.6 lb

## 2013-01-28 DIAGNOSIS — N039 Chronic nephritic syndrome with unspecified morphologic changes: Secondary | ICD-10-CM | POA: Diagnosis not present

## 2013-01-28 DIAGNOSIS — I10 Essential (primary) hypertension: Secondary | ICD-10-CM | POA: Diagnosis not present

## 2013-01-28 DIAGNOSIS — E1129 Type 2 diabetes mellitus with other diabetic kidney complication: Secondary | ICD-10-CM | POA: Diagnosis not present

## 2013-01-28 DIAGNOSIS — Z940 Kidney transplant status: Secondary | ICD-10-CM

## 2013-01-28 DIAGNOSIS — R197 Diarrhea, unspecified: Secondary | ICD-10-CM | POA: Diagnosis not present

## 2013-01-28 DIAGNOSIS — D631 Anemia in chronic kidney disease: Secondary | ICD-10-CM | POA: Diagnosis not present

## 2013-01-28 DIAGNOSIS — E1165 Type 2 diabetes mellitus with hyperglycemia: Secondary | ICD-10-CM | POA: Diagnosis not present

## 2013-01-28 DIAGNOSIS — R609 Edema, unspecified: Secondary | ICD-10-CM

## 2013-01-28 DIAGNOSIS — T8610 Unspecified complication of kidney transplant: Secondary | ICD-10-CM

## 2013-01-28 NOTE — Patient Instructions (Signed)
Continue current medications. 

## 2013-01-28 NOTE — Progress Notes (Signed)
Patient ID: Kirk [name], male   DOB: 1960/08/12, 53 y.o.   MRN: [id number]    Location:    [facility]  Place of Service:   OFFICE    Allergies  Allergen Reactions  . Codeine Nausea And Vomiting and Nausea Only    Chief Complaint  Patient presents with  . Follow-up    HPI:  Diarrhea of presumed infectious origin: 10 times daily . Liquid. No blood. Painless. Hx of C. difficile in the past.  HTN (hypertension): controlled  Type II or unspecified type diabetes mellitus with renal manifestations, uncontrolled(250.42): Continues with dietary noncompliance.  Anemia in chronic kidney disease: Chronic problem and last hemoglobin in early December 2014 showed 8.8 g percent.  Renal failure: Patient had cadaveric renal transplant in 2013. The transplant has failed. He has resumed hemodialysis. He continues to be followed at [facility].      Medications: Patient's Medications  New Prescriptions   No medications on file  Previous Medications   ACETAMINOPHEN (TYLENOL) 500 MG TABLET    Take by mouth.   ALBUTEROL (PROAIR HFA) 108 (90 BASE) MCG/ACT INHALER    Inhale into the lungs.   ALBUTEROL-IPRATROPIUM (COMBIVENT) 18-103 MCG/ACT INHALER    Inhale 2 puffs into the lungs every 6 (six) hours as needed for wheezing.   AMLODIPINE (NORVASC) 10 MG TABLET    Take 10 mg by mouth daily.   ASPIRIN EC 81 MG TABLET    Take 81 mg by mouth daily.   CARVEDILOL (COREG) 25 MG TABLET    Take one tablet in the morning and one tablet in the evening   CHOLECALCIFEROL 5000 UNITS TABS    Take 5,000 Units by mouth every morning.   DIPHENHYDRAMINE (BENADRYL) 25 MG TABLET    Take 25 mg by mouth 2 (two) times daily as needed.   DOXAZOSIN (CARDURA) 2 MG TABLET    Take 1 tablet (2 mg total) by mouth 2 (two) times daily.   FUROSEMIDE (LASIX) 20 MG TABLET    Take by mouth.   HYDRALAZINE (APRESOLINE) 25 MG TABLET    Take 25 mg by mouth 3 (three) times daily. Take 3 tabs every 6 hours by mouth   METRONIDAZOLE (FLAGYL) 500 MG  TABLET    Take 500 mg by mouth 3 (three) times daily.   MULTIPLE VITAMINS-MINERALS (MULTIVITAMIN WITH MINERALS) TABLET    Take by mouth.   MULTIVITAMIN (RENA-VIT) TABS TABLET    TAKE 1 TABLET BY MOUTH DAILY.   MYCOPHENOLATE (MYFORTIC) 360 MG TBEC    Take 360 mg by mouth 2 (two) times daily. Take 360mg  in the am and 180 mg in the pm.   OMEPRAZOLE (PRILOSEC) 20 MG CAPSULE    Take 20 mg by mouth every morning.   ONDANSETRON HCL PO    Take 4 mg by mouth daily as needed.    PRAVASTATIN (PRAVACHOL) 40 MG TABLET    Take one tablet by mouth once daily   PREDNISONE (DELTASONE) 5 MG TABLET    Take 5 mg by mouth daily.   PROBIOTIC PRODUCT (PROBIOTIC PO)    Take 2 capsules by mouth 2 (two) times daily.   SULFAMETHOXAZOLE-TRIMETHOPRIM (BACTRIM DS) 800-160 MG PER TABLET    Take 1 tablet by mouth 3 (three) times a week.   TACROLIMUS (PROGRAF) 1 MG CAPSULE    Take 6 mg by mouth 2 (two) times daily.    TRAMADOL (ULTRAM) 50 MG TABLET    Take 50 mg by mouth every 6 (six) hours  as needed for pain. Take one tablet every 6 hours as needed for pain  Modified Medications   No medications on file  Discontinued Medications   LORATADINE (CLARITIN) 10 MG TABLET    Take 10 mg by mouth daily.     Review of Systems  Constitutional: Negative for fever.       Chronically fatigued.  Eyes: Negative.   Respiratory: Positive for shortness of breath.        Dyspnea on exertion.  Cardiovascular: Negative for chest pain, palpitations and leg swelling.  Gastrointestinal: Negative for abdominal pain and abdominal distention.  Endocrine:       Diabetes is controlled.  Genitourinary: Negative.        Renal failure and kidney transplant.  Musculoskeletal:       Neck discomfort. Chronic right knee discomfort. Chronic back pains. Unsteady gait.  Neurological:       Memory is worse. Feet feel numb.  Hematological:       Anemia of CKD.  Psychiatric/Behavioral: Positive for confusion and decreased concentration.       Insomnia.  Falls asleep in the day.    Filed Vitals:   01/28/13 1634  BP: 142/78  Pulse: 77  Temp: 98.6 F (37 C)  TempSrc: Oral  Resp: 18  Height: 5\' 8"  (1.727 m)  Weight: 237 lb 9.6 oz (107.775 kg)  SpO2: 97%   Physical Exam  Constitutional: He is oriented to person, place, and time.  Obese.  HENT:  Loss of hearing.  Eyes:  Corrective lenses  Neck: No JVD present. No tracheal deviation present. No thyromegaly present.  Cardiovascular: Exam reveals no gallop.   No murmur heard. AF. 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  Abdominal: Soft. Bowel sounds are normal. He exhibits no distension and no mass. There is no tenderness.  Musculoskeletal: Normal range of motion. He exhibits edema and tenderness.  Scar left knee. Tender right knee  Lymphadenopathy:    He has no cervical adenopathy.  Neurological: He is alert and oriented to person, place, and time. No cranial nerve deficit. Coordination normal.  Positive Tinel sign bilaterally  Skin: No rash noted. No erythema. No pallor.  Psychiatric:  Slow. Depressed acting.     Labs reviewed: 12/27/12 Hgb 8.8 Office Visit on 01/28/2013  Component Date Value Range Status  . Glucose 01/28/2013 120* 65 - 99 mg/dL Final  . BUN 01/28/2013 27* 6 - 24 mg/dL Final  . Creatinine, Ser 01/28/2013 7.68* 0.76 - 1.27 mg/dL Final  . GFR calc non Af Amer 01/28/2013 7* >59 mL/min/1.73 Final  . GFR calc Af Amer 01/28/2013 9* >59 mL/min/1.73 Final  . BUN/Creatinine Ratio 01/28/2013 4* 9 - 20 Final  . Sodium 01/28/2013 144  134 - 144 mmol/L Final  . Potassium 01/28/2013 4.1  3.5 - 5.2 mmol/L Final  . Chloride 01/28/2013 100  97 - 108 mmol/L Final  . CO2 01/28/2013 26  18 - 29 mmol/L Final  . Calcium 01/28/2013 9.0  8.7 - 10.2 mg/dL Final   Comment: **Effective February 09, 2013 the reference interval**                            for Calcium, Serum will be changing to:                                       Age  Male          Male                                     0 - 10 days        8.6 - 10.4     8.6 - 10.4                              11 days -  1 year        9.2 - 11.0     9.2 - 11.0                                    2 - 11 years       9.1 - 10.5     9.1 - 10.5                                   12 - 17 years       8.9 - 10.4     8.9 - 10.4                                   18 - 59 years       8.7 - 10.2     8.7 - 10.2                                       >59 years       8.6 - 10.2     8.7 - 10.3  . Total Protein 01/28/2013 6.6  6.0 - 8.5 g/dL Final  . Albumin 01/28/2013 4.7  3.5 - 5.5 g/dL Final  . Globulin, Total 01/28/2013 1.9  1.5 - 4.5 g/dL Final  . Albumin/Globulin Ratio 01/28/2013 2.5  1.1 - 2.5 Final  . Total Bilirubin 01/28/2013 0.3  0.0 - 1.2 mg/dL Final  . Alkaline Phosphatase 01/28/2013 78  39 - 117 IU/L Final  . AST 01/28/2013 15  0 - 40 IU/L Final  . ALT 01/28/2013 16  0 - 44 IU/L Final  . C difficile by pcr 01/28/2013 Positive* Negative Final   Comment: Toxigenic C difficile: Positive                          Epidemic Strain Bl/NAP1/027: Presumptive Negative  . WBC 01/28/2013 4.2  3.4 - 10.8 x10E3/uL Final  . RBC 01/28/2013 3.41* 4.14 - 5.80 x10E6/uL Final  . Hemoglobin 01/28/2013 10.3* 12.6 - 17.7 g/dL Final  . HCT 01/28/2013 31.5* 37.5 - 51.0 % Final  . MCV 01/28/2013 92  79 - 97 fL Final  . MCH 01/28/2013 30.2  26.6 - 33.0 pg Final  . MCHC 01/28/2013 32.7  31.5 - 35.7 g/dL Final  . RDW 01/28/2013 14.7  12.3 - 15.4 % Final  . Platelets 01/28/2013 121* 150 - 379 x10E3/uL Final  . Neutrophils Relative % 01/28/2013 75   Final  . Lymphs 01/28/2013 14   Final  . Monocytes 01/28/2013 10   Final  .  Eos 01/28/2013 0   Final  . Basos 01/28/2013 1   Final  . Neutrophils Absolute 01/28/2013 3.2  1.4 - 7.0 x10E3/uL Final  . Lymphocytes Absolute 01/28/2013 0.6* 0.7 - 3.1 x10E3/uL Final  . Monocytes Absolute 01/28/2013 0.4  0.1 - 0.9 x10E3/uL Final  . Eosinophils Absolute 01/28/2013 0.0  0.0 - 0.4 x10E3/uL Final  .  Basophils Absolute 01/28/2013 0.0  0.0 - 0.2 x10E3/uL Final  . Immature Granulocytes 01/28/2013 0   Final  . Immature Grans (Abs) 01/28/2013 0.0  0.0 - 0.1 x10E3/uL Final       Assessment/Plan  Diarrhea of presumed infectious origin - Plan: C. difficile, PCR  Following this visit stool analysis returned positive for C. Difficile. He was started on metronidazole 500 mg 3 times daily for the next 3 weeks.  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 He is on the list for a new kidney.

## 2013-01-29 DIAGNOSIS — Z23 Encounter for immunization: Secondary | ICD-10-CM | POA: Diagnosis not present

## 2013-01-29 DIAGNOSIS — D631 Anemia in chronic kidney disease: Secondary | ICD-10-CM | POA: Diagnosis not present

## 2013-01-29 DIAGNOSIS — N2581 Secondary hyperparathyroidism of renal origin: Secondary | ICD-10-CM | POA: Diagnosis not present

## 2013-01-29 DIAGNOSIS — N186 End stage renal disease: Secondary | ICD-10-CM | POA: Diagnosis not present

## 2013-01-29 LAB — COMPREHENSIVE METABOLIC PANEL
A/G RATIO: 2.5 (ref 1.1–2.5)
ALK PHOS: 78 IU/L (ref 39–117)
ALT: 16 IU/L (ref 0–44)
AST: 15 IU/L (ref 0–40)
Albumin: 4.7 g/dL (ref 3.5–5.5)
BILIRUBIN TOTAL: 0.3 mg/dL (ref 0.0–1.2)
BUN / CREAT RATIO: 4 — AB (ref 9–20)
BUN: 27 mg/dL — ABNORMAL HIGH (ref 6–24)
CHLORIDE: 100 mmol/L (ref 97–108)
CO2: 26 mmol/L (ref 18–29)
Calcium: 9 mg/dL (ref 8.7–10.2)
Creatinine, Ser: 7.68 mg/dL — ABNORMAL HIGH (ref 0.76–1.27)
GFR, EST AFRICAN AMERICAN: 9 mL/min/{1.73_m2} — AB (ref >59)
GFR, EST NON AFRICAN AMERICAN: 7 mL/min/{1.73_m2} — AB (ref >59)
Globulin, Total: 1.9 g/dL (ref 1.5–4.5)
Glucose: 120 mg/dL — ABNORMAL HIGH (ref 65–99)
POTASSIUM: 4.1 mmol/L (ref 3.5–5.2)
SODIUM: 144 mmol/L (ref 134–144)
TOTAL PROTEIN: 6.6 g/dL (ref 6.0–8.5)

## 2013-01-29 LAB — CBC WITH DIFFERENTIAL
BASOS ABS: 0 10*3/uL (ref 0.0–0.2)
BASOS: 1 %
EOS ABS: 0 10*3/uL (ref 0.0–0.4)
Eos: 0 %
HCT: 31.5 % — ABNORMAL LOW (ref 37.5–51.0)
HEMOGLOBIN: 10.3 g/dL — AB (ref 12.6–17.7)
IMMATURE GRANS (ABS): 0 10*3/uL (ref 0.0–0.1)
Immature Granulocytes: 0 %
Lymphocytes Absolute: 0.6 10*3/uL — ABNORMAL LOW (ref 0.7–3.1)
Lymphs: 14 %
MCH: 30.2 pg (ref 26.6–33.0)
MCHC: 32.7 g/dL (ref 31.5–35.7)
MCV: 92 fL (ref 79–97)
MONOS ABS: 0.4 10*3/uL (ref 0.1–0.9)
Monocytes: 10 %
NEUTROS PCT: 75 %
Neutrophils Absolute: 3.2 10*3/uL (ref 1.4–7.0)
Platelets: 121 10*3/uL — ABNORMAL LOW (ref 150–379)
RBC: 3.41 x10E6/uL — ABNORMAL LOW (ref 4.14–5.80)
RDW: 14.7 % (ref 12.3–15.4)
WBC: 4.2 10*3/uL (ref 3.4–10.8)

## 2013-01-29 LAB — CLOSTRIDIUM DIFFICILE BY PCR: CDIFFPCR: POSITIVE — AB

## 2013-01-30 ENCOUNTER — Telehealth: Payer: Self-pay

## 2013-01-30 MED ORDER — METRONIDAZOLE 500 MG PO TABS: 500.0000 mg | ORAL_TABLET | Freq: Three times a day (TID) | ORAL | Status: DC

## 2013-01-30 NOTE — Telephone Encounter (Signed)
Message copied by Logan Bores on Fri Jan 30, 2013 11:44 AM ------      Message from: Estill Dooms      Created: Thu Jan 29, 2013  5:43 PM       Stool is positive. Start Metronidazole 500 mg tid for 14 days. Call in Rx. ------

## 2013-01-30 NOTE — Telephone Encounter (Signed)
Spoke with patient's wife, discussed recent lab results. Patient's wife verbalized understanding of results. RX sent to [facility] and [address]

## 2013-01-31 DIAGNOSIS — D631 Anemia in chronic kidney disease: Secondary | ICD-10-CM | POA: Diagnosis not present

## 2013-01-31 DIAGNOSIS — N2581 Secondary hyperparathyroidism of renal origin: Secondary | ICD-10-CM | POA: Diagnosis not present

## 2013-01-31 DIAGNOSIS — N186 End stage renal disease: Secondary | ICD-10-CM | POA: Diagnosis not present

## 2013-01-31 DIAGNOSIS — Z23 Encounter for immunization: Secondary | ICD-10-CM | POA: Diagnosis not present

## 2013-01-31 DIAGNOSIS — N039 Chronic nephritic syndrome with unspecified morphologic changes: Secondary | ICD-10-CM | POA: Diagnosis not present

## 2013-02-03 DIAGNOSIS — N186 End stage renal disease: Secondary | ICD-10-CM | POA: Diagnosis not present

## 2013-02-03 DIAGNOSIS — N039 Chronic nephritic syndrome with unspecified morphologic changes: Secondary | ICD-10-CM | POA: Diagnosis not present

## 2013-02-03 DIAGNOSIS — D631 Anemia in chronic kidney disease: Secondary | ICD-10-CM | POA: Diagnosis not present

## 2013-02-03 DIAGNOSIS — N2581 Secondary hyperparathyroidism of renal origin: Secondary | ICD-10-CM | POA: Diagnosis not present

## 2013-02-03 DIAGNOSIS — Z23 Encounter for immunization: Secondary | ICD-10-CM | POA: Diagnosis not present

## 2013-02-05 DIAGNOSIS — N2581 Secondary hyperparathyroidism of renal origin: Secondary | ICD-10-CM | POA: Diagnosis not present

## 2013-02-05 DIAGNOSIS — Z23 Encounter for immunization: Secondary | ICD-10-CM | POA: Diagnosis not present

## 2013-02-05 DIAGNOSIS — N186 End stage renal disease: Secondary | ICD-10-CM | POA: Diagnosis not present

## 2013-02-05 DIAGNOSIS — N039 Chronic nephritic syndrome with unspecified morphologic changes: Secondary | ICD-10-CM | POA: Diagnosis not present

## 2013-02-05 DIAGNOSIS — D631 Anemia in chronic kidney disease: Secondary | ICD-10-CM | POA: Diagnosis not present

## 2013-02-07 DIAGNOSIS — N186 End stage renal disease: Secondary | ICD-10-CM | POA: Diagnosis not present

## 2013-02-07 DIAGNOSIS — Z23 Encounter for immunization: Secondary | ICD-10-CM | POA: Diagnosis not present

## 2013-02-07 DIAGNOSIS — D631 Anemia in chronic kidney disease: Secondary | ICD-10-CM | POA: Diagnosis not present

## 2013-02-07 DIAGNOSIS — N2581 Secondary hyperparathyroidism of renal origin: Secondary | ICD-10-CM | POA: Diagnosis not present

## 2013-02-10 DIAGNOSIS — D631 Anemia in chronic kidney disease: Secondary | ICD-10-CM | POA: Diagnosis not present

## 2013-02-10 DIAGNOSIS — N186 End stage renal disease: Secondary | ICD-10-CM | POA: Diagnosis not present

## 2013-02-10 DIAGNOSIS — Z23 Encounter for immunization: Secondary | ICD-10-CM | POA: Diagnosis not present

## 2013-02-10 DIAGNOSIS — N039 Chronic nephritic syndrome with unspecified morphologic changes: Secondary | ICD-10-CM | POA: Diagnosis not present

## 2013-02-10 DIAGNOSIS — N2581 Secondary hyperparathyroidism of renal origin: Secondary | ICD-10-CM | POA: Diagnosis not present

## 2013-02-12 DIAGNOSIS — N2581 Secondary hyperparathyroidism of renal origin: Secondary | ICD-10-CM | POA: Diagnosis not present

## 2013-02-12 DIAGNOSIS — N186 End stage renal disease: Secondary | ICD-10-CM | POA: Diagnosis not present

## 2013-02-12 DIAGNOSIS — D631 Anemia in chronic kidney disease: Secondary | ICD-10-CM | POA: Diagnosis not present

## 2013-02-12 DIAGNOSIS — Z23 Encounter for immunization: Secondary | ICD-10-CM | POA: Diagnosis not present

## 2013-02-13 DIAGNOSIS — Z1159 Encounter for screening for other viral diseases: Secondary | ICD-10-CM | POA: Diagnosis not present

## 2013-02-13 DIAGNOSIS — Z7682 Awaiting organ transplant status: Secondary | ICD-10-CM | POA: Insufficient documentation

## 2013-02-13 DIAGNOSIS — G4733 Obstructive sleep apnea (adult) (pediatric): Secondary | ICD-10-CM | POA: Diagnosis not present

## 2013-02-13 DIAGNOSIS — Z940 Kidney transplant status: Secondary | ICD-10-CM | POA: Diagnosis not present

## 2013-02-13 DIAGNOSIS — Z125 Encounter for screening for malignant neoplasm of prostate: Secondary | ICD-10-CM | POA: Diagnosis not present

## 2013-02-13 DIAGNOSIS — I10 Essential (primary) hypertension: Secondary | ICD-10-CM | POA: Diagnosis not present

## 2013-02-14 DIAGNOSIS — D631 Anemia in chronic kidney disease: Secondary | ICD-10-CM | POA: Diagnosis not present

## 2013-02-14 DIAGNOSIS — N186 End stage renal disease: Secondary | ICD-10-CM | POA: Diagnosis not present

## 2013-02-14 DIAGNOSIS — N039 Chronic nephritic syndrome with unspecified morphologic changes: Secondary | ICD-10-CM | POA: Diagnosis not present

## 2013-02-14 DIAGNOSIS — N2581 Secondary hyperparathyroidism of renal origin: Secondary | ICD-10-CM | POA: Diagnosis not present

## 2013-02-14 DIAGNOSIS — Z23 Encounter for immunization: Secondary | ICD-10-CM | POA: Diagnosis not present

## 2013-02-16 ENCOUNTER — Other Ambulatory Visit: Payer: Self-pay | Admitting: *Deleted

## 2013-02-16 MED ORDER — PRAVASTATIN SODIUM 40 MG PO TABS: ORAL_TABLET | ORAL | Status: DC

## 2013-02-16 MED ORDER — CARVEDILOL 25 MG PO TABS: ORAL_TABLET | ORAL | Status: DC

## 2013-02-16 MED ORDER — DOXAZOSIN MESYLATE 2 MG PO TABS: 2.0000 mg | ORAL_TABLET | Freq: Two times a day (BID) | ORAL | Status: DC

## 2013-02-17 DIAGNOSIS — D631 Anemia in chronic kidney disease: Secondary | ICD-10-CM | POA: Diagnosis not present

## 2013-02-17 DIAGNOSIS — N039 Chronic nephritic syndrome with unspecified morphologic changes: Secondary | ICD-10-CM | POA: Diagnosis not present

## 2013-02-17 DIAGNOSIS — N186 End stage renal disease: Secondary | ICD-10-CM | POA: Diagnosis not present

## 2013-02-17 DIAGNOSIS — Z23 Encounter for immunization: Secondary | ICD-10-CM | POA: Diagnosis not present

## 2013-02-17 DIAGNOSIS — N2581 Secondary hyperparathyroidism of renal origin: Secondary | ICD-10-CM | POA: Diagnosis not present

## 2013-02-19 DIAGNOSIS — D631 Anemia in chronic kidney disease: Secondary | ICD-10-CM | POA: Diagnosis not present

## 2013-02-19 DIAGNOSIS — N186 End stage renal disease: Secondary | ICD-10-CM | POA: Diagnosis not present

## 2013-02-19 DIAGNOSIS — N2581 Secondary hyperparathyroidism of renal origin: Secondary | ICD-10-CM | POA: Diagnosis not present

## 2013-02-19 DIAGNOSIS — Z23 Encounter for immunization: Secondary | ICD-10-CM | POA: Diagnosis not present

## 2013-02-19 DIAGNOSIS — N039 Chronic nephritic syndrome with unspecified morphologic changes: Secondary | ICD-10-CM | POA: Diagnosis not present

## 2013-02-21 DIAGNOSIS — Z23 Encounter for immunization: Secondary | ICD-10-CM | POA: Diagnosis not present

## 2013-02-21 DIAGNOSIS — D631 Anemia in chronic kidney disease: Secondary | ICD-10-CM | POA: Diagnosis not present

## 2013-02-21 DIAGNOSIS — N186 End stage renal disease: Secondary | ICD-10-CM | POA: Diagnosis not present

## 2013-02-21 DIAGNOSIS — N2581 Secondary hyperparathyroidism of renal origin: Secondary | ICD-10-CM | POA: Diagnosis not present

## 2013-02-24 DIAGNOSIS — D631 Anemia in chronic kidney disease: Secondary | ICD-10-CM | POA: Diagnosis not present

## 2013-02-24 DIAGNOSIS — D509 Iron deficiency anemia, unspecified: Secondary | ICD-10-CM | POA: Diagnosis not present

## 2013-02-24 DIAGNOSIS — N186 End stage renal disease: Secondary | ICD-10-CM | POA: Diagnosis not present

## 2013-02-24 DIAGNOSIS — Z23 Encounter for immunization: Secondary | ICD-10-CM | POA: Diagnosis not present

## 2013-02-24 DIAGNOSIS — N2581 Secondary hyperparathyroidism of renal origin: Secondary | ICD-10-CM | POA: Diagnosis not present

## 2013-02-26 DIAGNOSIS — N2581 Secondary hyperparathyroidism of renal origin: Secondary | ICD-10-CM | POA: Diagnosis not present

## 2013-02-26 DIAGNOSIS — N186 End stage renal disease: Secondary | ICD-10-CM | POA: Diagnosis not present

## 2013-02-26 DIAGNOSIS — D631 Anemia in chronic kidney disease: Secondary | ICD-10-CM | POA: Diagnosis not present

## 2013-02-26 DIAGNOSIS — Z23 Encounter for immunization: Secondary | ICD-10-CM | POA: Diagnosis not present

## 2013-02-26 DIAGNOSIS — D509 Iron deficiency anemia, unspecified: Secondary | ICD-10-CM | POA: Diagnosis not present

## 2013-02-28 DIAGNOSIS — D631 Anemia in chronic kidney disease: Secondary | ICD-10-CM | POA: Diagnosis not present

## 2013-02-28 DIAGNOSIS — N186 End stage renal disease: Secondary | ICD-10-CM | POA: Diagnosis not present

## 2013-02-28 DIAGNOSIS — D509 Iron deficiency anemia, unspecified: Secondary | ICD-10-CM | POA: Diagnosis not present

## 2013-02-28 DIAGNOSIS — N2581 Secondary hyperparathyroidism of renal origin: Secondary | ICD-10-CM | POA: Diagnosis not present

## 2013-02-28 DIAGNOSIS — Z23 Encounter for immunization: Secondary | ICD-10-CM | POA: Diagnosis not present

## 2013-03-03 DIAGNOSIS — D509 Iron deficiency anemia, unspecified: Secondary | ICD-10-CM | POA: Diagnosis not present

## 2013-03-03 DIAGNOSIS — Z23 Encounter for immunization: Secondary | ICD-10-CM | POA: Diagnosis not present

## 2013-03-03 DIAGNOSIS — N186 End stage renal disease: Secondary | ICD-10-CM | POA: Diagnosis not present

## 2013-03-03 DIAGNOSIS — N2581 Secondary hyperparathyroidism of renal origin: Secondary | ICD-10-CM | POA: Diagnosis not present

## 2013-03-03 DIAGNOSIS — D631 Anemia in chronic kidney disease: Secondary | ICD-10-CM | POA: Diagnosis not present

## 2013-03-05 DIAGNOSIS — D509 Iron deficiency anemia, unspecified: Secondary | ICD-10-CM | POA: Diagnosis not present

## 2013-03-05 DIAGNOSIS — N2581 Secondary hyperparathyroidism of renal origin: Secondary | ICD-10-CM | POA: Diagnosis not present

## 2013-03-05 DIAGNOSIS — Z23 Encounter for immunization: Secondary | ICD-10-CM | POA: Diagnosis not present

## 2013-03-05 DIAGNOSIS — D631 Anemia in chronic kidney disease: Secondary | ICD-10-CM | POA: Diagnosis not present

## 2013-03-05 DIAGNOSIS — N186 End stage renal disease: Secondary | ICD-10-CM | POA: Diagnosis not present

## 2013-03-07 DIAGNOSIS — Z23 Encounter for immunization: Secondary | ICD-10-CM | POA: Diagnosis not present

## 2013-03-07 DIAGNOSIS — N186 End stage renal disease: Secondary | ICD-10-CM | POA: Diagnosis not present

## 2013-03-07 DIAGNOSIS — D631 Anemia in chronic kidney disease: Secondary | ICD-10-CM | POA: Diagnosis not present

## 2013-03-07 DIAGNOSIS — N2581 Secondary hyperparathyroidism of renal origin: Secondary | ICD-10-CM | POA: Diagnosis not present

## 2013-03-07 DIAGNOSIS — D509 Iron deficiency anemia, unspecified: Secondary | ICD-10-CM | POA: Diagnosis not present

## 2013-03-09 DIAGNOSIS — N186 End stage renal disease: Secondary | ICD-10-CM | POA: Diagnosis not present

## 2013-03-09 DIAGNOSIS — D631 Anemia in chronic kidney disease: Secondary | ICD-10-CM | POA: Diagnosis not present

## 2013-03-09 DIAGNOSIS — N039 Chronic nephritic syndrome with unspecified morphologic changes: Secondary | ICD-10-CM | POA: Diagnosis not present

## 2013-03-09 DIAGNOSIS — N2581 Secondary hyperparathyroidism of renal origin: Secondary | ICD-10-CM | POA: Diagnosis not present

## 2013-03-09 DIAGNOSIS — D509 Iron deficiency anemia, unspecified: Secondary | ICD-10-CM | POA: Diagnosis not present

## 2013-03-09 DIAGNOSIS — Z23 Encounter for immunization: Secondary | ICD-10-CM | POA: Diagnosis not present

## 2013-03-12 DIAGNOSIS — D631 Anemia in chronic kidney disease: Secondary | ICD-10-CM | POA: Diagnosis not present

## 2013-03-12 DIAGNOSIS — Z23 Encounter for immunization: Secondary | ICD-10-CM | POA: Diagnosis not present

## 2013-03-12 DIAGNOSIS — N2581 Secondary hyperparathyroidism of renal origin: Secondary | ICD-10-CM | POA: Diagnosis not present

## 2013-03-12 DIAGNOSIS — N186 End stage renal disease: Secondary | ICD-10-CM | POA: Diagnosis not present

## 2013-03-12 DIAGNOSIS — D509 Iron deficiency anemia, unspecified: Secondary | ICD-10-CM | POA: Diagnosis not present

## 2013-03-14 DIAGNOSIS — D631 Anemia in chronic kidney disease: Secondary | ICD-10-CM | POA: Diagnosis not present

## 2013-03-14 DIAGNOSIS — Z23 Encounter for immunization: Secondary | ICD-10-CM | POA: Diagnosis not present

## 2013-03-14 DIAGNOSIS — N186 End stage renal disease: Secondary | ICD-10-CM | POA: Diagnosis not present

## 2013-03-14 DIAGNOSIS — N2581 Secondary hyperparathyroidism of renal origin: Secondary | ICD-10-CM | POA: Diagnosis not present

## 2013-03-14 DIAGNOSIS — D509 Iron deficiency anemia, unspecified: Secondary | ICD-10-CM | POA: Diagnosis not present

## 2013-03-17 DIAGNOSIS — Z23 Encounter for immunization: Secondary | ICD-10-CM | POA: Diagnosis not present

## 2013-03-17 DIAGNOSIS — N186 End stage renal disease: Secondary | ICD-10-CM | POA: Diagnosis not present

## 2013-03-17 DIAGNOSIS — D509 Iron deficiency anemia, unspecified: Secondary | ICD-10-CM | POA: Diagnosis not present

## 2013-03-17 DIAGNOSIS — D631 Anemia in chronic kidney disease: Secondary | ICD-10-CM | POA: Diagnosis not present

## 2013-03-17 DIAGNOSIS — N2581 Secondary hyperparathyroidism of renal origin: Secondary | ICD-10-CM | POA: Diagnosis not present

## 2013-03-17 DIAGNOSIS — N039 Chronic nephritic syndrome with unspecified morphologic changes: Secondary | ICD-10-CM | POA: Diagnosis not present

## 2013-03-18 ENCOUNTER — Telehealth: Payer: Self-pay | Admitting: *Deleted

## 2013-03-18 NOTE — Telephone Encounter (Signed)
Patient wife called and stated that patient's insurance will not cover Doxazosin 2mg  but will cover Doxazosin 4mg . Please Advise

## 2013-03-19 DIAGNOSIS — Z23 Encounter for immunization: Secondary | ICD-10-CM | POA: Diagnosis not present

## 2013-03-19 DIAGNOSIS — D509 Iron deficiency anemia, unspecified: Secondary | ICD-10-CM | POA: Diagnosis not present

## 2013-03-19 DIAGNOSIS — N2581 Secondary hyperparathyroidism of renal origin: Secondary | ICD-10-CM | POA: Diagnosis not present

## 2013-03-19 DIAGNOSIS — N186 End stage renal disease: Secondary | ICD-10-CM | POA: Diagnosis not present

## 2013-03-19 DIAGNOSIS — D631 Anemia in chronic kidney disease: Secondary | ICD-10-CM | POA: Diagnosis not present

## 2013-03-21 DIAGNOSIS — D509 Iron deficiency anemia, unspecified: Secondary | ICD-10-CM | POA: Diagnosis not present

## 2013-03-21 DIAGNOSIS — N039 Chronic nephritic syndrome with unspecified morphologic changes: Secondary | ICD-10-CM | POA: Diagnosis not present

## 2013-03-21 DIAGNOSIS — Z23 Encounter for immunization: Secondary | ICD-10-CM | POA: Diagnosis not present

## 2013-03-21 DIAGNOSIS — N186 End stage renal disease: Secondary | ICD-10-CM | POA: Diagnosis not present

## 2013-03-21 DIAGNOSIS — D631 Anemia in chronic kidney disease: Secondary | ICD-10-CM | POA: Diagnosis not present

## 2013-03-21 DIAGNOSIS — N2581 Secondary hyperparathyroidism of renal origin: Secondary | ICD-10-CM | POA: Diagnosis not present

## 2013-03-23 ENCOUNTER — Other Ambulatory Visit: Payer: Self-pay | Admitting: *Deleted

## 2013-03-23 MED ORDER — DOXAZOSIN MESYLATE 4 MG PO TABS: ORAL_TABLET | ORAL | Status: DC

## 2013-03-23 NOTE — Telephone Encounter (Signed)
Faxed Rx into pharmacy and patient notified.

## 2013-03-23 NOTE — Telephone Encounter (Signed)
Change doxazosin to 4 mg. Disp # 9. Take one daily to regulate BP. Refill 3 times.

## 2013-03-24 DIAGNOSIS — N186 End stage renal disease: Secondary | ICD-10-CM | POA: Diagnosis not present

## 2013-03-24 DIAGNOSIS — N2581 Secondary hyperparathyroidism of renal origin: Secondary | ICD-10-CM | POA: Diagnosis not present

## 2013-03-24 DIAGNOSIS — N039 Chronic nephritic syndrome with unspecified morphologic changes: Secondary | ICD-10-CM | POA: Diagnosis not present

## 2013-03-24 DIAGNOSIS — D631 Anemia in chronic kidney disease: Secondary | ICD-10-CM | POA: Diagnosis not present

## 2013-03-24 DIAGNOSIS — D509 Iron deficiency anemia, unspecified: Secondary | ICD-10-CM | POA: Diagnosis not present

## 2013-03-24 DIAGNOSIS — Z23 Encounter for immunization: Secondary | ICD-10-CM | POA: Diagnosis not present

## 2013-03-26 DIAGNOSIS — N2581 Secondary hyperparathyroidism of renal origin: Secondary | ICD-10-CM | POA: Diagnosis not present

## 2013-03-26 DIAGNOSIS — Z23 Encounter for immunization: Secondary | ICD-10-CM | POA: Diagnosis not present

## 2013-03-26 DIAGNOSIS — N186 End stage renal disease: Secondary | ICD-10-CM | POA: Diagnosis not present

## 2013-03-26 DIAGNOSIS — D509 Iron deficiency anemia, unspecified: Secondary | ICD-10-CM | POA: Diagnosis not present

## 2013-03-26 DIAGNOSIS — D631 Anemia in chronic kidney disease: Secondary | ICD-10-CM | POA: Diagnosis not present

## 2013-03-28 DIAGNOSIS — Z23 Encounter for immunization: Secondary | ICD-10-CM | POA: Diagnosis not present

## 2013-03-28 DIAGNOSIS — D509 Iron deficiency anemia, unspecified: Secondary | ICD-10-CM | POA: Diagnosis not present

## 2013-03-28 DIAGNOSIS — N2581 Secondary hyperparathyroidism of renal origin: Secondary | ICD-10-CM | POA: Diagnosis not present

## 2013-03-28 DIAGNOSIS — N186 End stage renal disease: Secondary | ICD-10-CM | POA: Diagnosis not present

## 2013-03-28 DIAGNOSIS — D631 Anemia in chronic kidney disease: Secondary | ICD-10-CM | POA: Diagnosis not present

## 2013-03-31 DIAGNOSIS — D509 Iron deficiency anemia, unspecified: Secondary | ICD-10-CM | POA: Diagnosis not present

## 2013-03-31 DIAGNOSIS — D631 Anemia in chronic kidney disease: Secondary | ICD-10-CM | POA: Diagnosis not present

## 2013-03-31 DIAGNOSIS — N039 Chronic nephritic syndrome with unspecified morphologic changes: Secondary | ICD-10-CM | POA: Diagnosis not present

## 2013-03-31 DIAGNOSIS — N2581 Secondary hyperparathyroidism of renal origin: Secondary | ICD-10-CM | POA: Diagnosis not present

## 2013-03-31 DIAGNOSIS — N186 End stage renal disease: Secondary | ICD-10-CM | POA: Diagnosis not present

## 2013-03-31 DIAGNOSIS — Z23 Encounter for immunization: Secondary | ICD-10-CM | POA: Diagnosis not present

## 2013-04-01 DIAGNOSIS — E669 Obesity, unspecified: Secondary | ICD-10-CM | POA: Diagnosis not present

## 2013-04-01 DIAGNOSIS — Z01810 Encounter for preprocedural cardiovascular examination: Secondary | ICD-10-CM | POA: Diagnosis not present

## 2013-04-01 DIAGNOSIS — N186 End stage renal disease: Secondary | ICD-10-CM | POA: Diagnosis not present

## 2013-04-01 DIAGNOSIS — Z298 Encounter for other specified prophylactic measures: Secondary | ICD-10-CM | POA: Diagnosis not present

## 2013-04-01 DIAGNOSIS — Z01818 Encounter for other preprocedural examination: Secondary | ICD-10-CM | POA: Diagnosis not present

## 2013-04-01 DIAGNOSIS — D649 Anemia, unspecified: Secondary | ICD-10-CM | POA: Diagnosis not present

## 2013-04-01 DIAGNOSIS — Z940 Kidney transplant status: Secondary | ICD-10-CM | POA: Diagnosis not present

## 2013-04-01 DIAGNOSIS — E119 Type 2 diabetes mellitus without complications: Secondary | ICD-10-CM | POA: Diagnosis not present

## 2013-04-01 DIAGNOSIS — I10 Essential (primary) hypertension: Secondary | ICD-10-CM | POA: Diagnosis not present

## 2013-04-02 DIAGNOSIS — N2581 Secondary hyperparathyroidism of renal origin: Secondary | ICD-10-CM | POA: Diagnosis not present

## 2013-04-02 DIAGNOSIS — D631 Anemia in chronic kidney disease: Secondary | ICD-10-CM | POA: Diagnosis not present

## 2013-04-02 DIAGNOSIS — D509 Iron deficiency anemia, unspecified: Secondary | ICD-10-CM | POA: Diagnosis not present

## 2013-04-02 DIAGNOSIS — Z23 Encounter for immunization: Secondary | ICD-10-CM | POA: Diagnosis not present

## 2013-04-02 DIAGNOSIS — N186 End stage renal disease: Secondary | ICD-10-CM | POA: Diagnosis not present

## 2013-04-04 DIAGNOSIS — D631 Anemia in chronic kidney disease: Secondary | ICD-10-CM | POA: Diagnosis not present

## 2013-04-04 DIAGNOSIS — D509 Iron deficiency anemia, unspecified: Secondary | ICD-10-CM | POA: Diagnosis not present

## 2013-04-04 DIAGNOSIS — N2581 Secondary hyperparathyroidism of renal origin: Secondary | ICD-10-CM | POA: Diagnosis not present

## 2013-04-04 DIAGNOSIS — N186 End stage renal disease: Secondary | ICD-10-CM | POA: Diagnosis not present

## 2013-04-04 DIAGNOSIS — Z23 Encounter for immunization: Secondary | ICD-10-CM | POA: Diagnosis not present

## 2013-04-06 ENCOUNTER — Other Ambulatory Visit: Payer: Self-pay | Admitting: Nurse Practitioner

## 2013-04-06 DIAGNOSIS — D509 Iron deficiency anemia, unspecified: Secondary | ICD-10-CM | POA: Diagnosis not present

## 2013-04-06 DIAGNOSIS — D631 Anemia in chronic kidney disease: Secondary | ICD-10-CM | POA: Diagnosis not present

## 2013-04-06 DIAGNOSIS — N039 Chronic nephritic syndrome with unspecified morphologic changes: Secondary | ICD-10-CM | POA: Diagnosis not present

## 2013-04-06 DIAGNOSIS — Z23 Encounter for immunization: Secondary | ICD-10-CM | POA: Diagnosis not present

## 2013-04-06 DIAGNOSIS — N186 End stage renal disease: Secondary | ICD-10-CM | POA: Diagnosis not present

## 2013-04-06 DIAGNOSIS — N2581 Secondary hyperparathyroidism of renal origin: Secondary | ICD-10-CM | POA: Diagnosis not present

## 2013-04-08 DIAGNOSIS — D631 Anemia in chronic kidney disease: Secondary | ICD-10-CM | POA: Diagnosis not present

## 2013-04-08 DIAGNOSIS — D509 Iron deficiency anemia, unspecified: Secondary | ICD-10-CM | POA: Diagnosis not present

## 2013-04-08 DIAGNOSIS — N186 End stage renal disease: Secondary | ICD-10-CM | POA: Diagnosis not present

## 2013-04-08 DIAGNOSIS — N2581 Secondary hyperparathyroidism of renal origin: Secondary | ICD-10-CM | POA: Diagnosis not present

## 2013-04-08 DIAGNOSIS — Z23 Encounter for immunization: Secondary | ICD-10-CM | POA: Diagnosis not present

## 2013-04-10 DIAGNOSIS — D631 Anemia in chronic kidney disease: Secondary | ICD-10-CM | POA: Diagnosis not present

## 2013-04-10 DIAGNOSIS — N2581 Secondary hyperparathyroidism of renal origin: Secondary | ICD-10-CM | POA: Diagnosis not present

## 2013-04-10 DIAGNOSIS — Z23 Encounter for immunization: Secondary | ICD-10-CM | POA: Diagnosis not present

## 2013-04-10 DIAGNOSIS — N186 End stage renal disease: Secondary | ICD-10-CM | POA: Diagnosis not present

## 2013-04-10 DIAGNOSIS — D509 Iron deficiency anemia, unspecified: Secondary | ICD-10-CM | POA: Diagnosis not present

## 2013-04-13 DIAGNOSIS — Z23 Encounter for immunization: Secondary | ICD-10-CM | POA: Diagnosis not present

## 2013-04-13 DIAGNOSIS — N186 End stage renal disease: Secondary | ICD-10-CM | POA: Diagnosis not present

## 2013-04-13 DIAGNOSIS — N2581 Secondary hyperparathyroidism of renal origin: Secondary | ICD-10-CM | POA: Diagnosis not present

## 2013-04-13 DIAGNOSIS — D509 Iron deficiency anemia, unspecified: Secondary | ICD-10-CM | POA: Diagnosis not present

## 2013-04-13 DIAGNOSIS — D631 Anemia in chronic kidney disease: Secondary | ICD-10-CM | POA: Diagnosis not present

## 2013-04-13 DIAGNOSIS — N039 Chronic nephritic syndrome with unspecified morphologic changes: Secondary | ICD-10-CM | POA: Diagnosis not present

## 2013-04-15 DIAGNOSIS — Z23 Encounter for immunization: Secondary | ICD-10-CM | POA: Diagnosis not present

## 2013-04-15 DIAGNOSIS — N2581 Secondary hyperparathyroidism of renal origin: Secondary | ICD-10-CM | POA: Diagnosis not present

## 2013-04-15 DIAGNOSIS — D509 Iron deficiency anemia, unspecified: Secondary | ICD-10-CM | POA: Diagnosis not present

## 2013-04-15 DIAGNOSIS — N186 End stage renal disease: Secondary | ICD-10-CM | POA: Diagnosis not present

## 2013-04-15 DIAGNOSIS — D631 Anemia in chronic kidney disease: Secondary | ICD-10-CM | POA: Diagnosis not present

## 2013-04-17 DIAGNOSIS — Z23 Encounter for immunization: Secondary | ICD-10-CM | POA: Diagnosis not present

## 2013-04-17 DIAGNOSIS — D631 Anemia in chronic kidney disease: Secondary | ICD-10-CM | POA: Diagnosis not present

## 2013-04-17 DIAGNOSIS — D509 Iron deficiency anemia, unspecified: Secondary | ICD-10-CM | POA: Diagnosis not present

## 2013-04-17 DIAGNOSIS — N2581 Secondary hyperparathyroidism of renal origin: Secondary | ICD-10-CM | POA: Diagnosis not present

## 2013-04-17 DIAGNOSIS — N186 End stage renal disease: Secondary | ICD-10-CM | POA: Diagnosis not present

## 2013-04-20 DIAGNOSIS — D509 Iron deficiency anemia, unspecified: Secondary | ICD-10-CM | POA: Diagnosis not present

## 2013-04-20 DIAGNOSIS — N186 End stage renal disease: Secondary | ICD-10-CM | POA: Diagnosis not present

## 2013-04-20 DIAGNOSIS — N039 Chronic nephritic syndrome with unspecified morphologic changes: Secondary | ICD-10-CM | POA: Diagnosis not present

## 2013-04-20 DIAGNOSIS — Z23 Encounter for immunization: Secondary | ICD-10-CM | POA: Diagnosis not present

## 2013-04-20 DIAGNOSIS — N2581 Secondary hyperparathyroidism of renal origin: Secondary | ICD-10-CM | POA: Diagnosis not present

## 2013-04-20 DIAGNOSIS — D631 Anemia in chronic kidney disease: Secondary | ICD-10-CM | POA: Diagnosis not present

## 2013-04-21 DIAGNOSIS — N186 End stage renal disease: Secondary | ICD-10-CM | POA: Diagnosis not present

## 2013-04-22 DIAGNOSIS — Z23 Encounter for immunization: Secondary | ICD-10-CM | POA: Diagnosis not present

## 2013-04-22 DIAGNOSIS — D631 Anemia in chronic kidney disease: Secondary | ICD-10-CM | POA: Diagnosis not present

## 2013-04-22 DIAGNOSIS — N2581 Secondary hyperparathyroidism of renal origin: Secondary | ICD-10-CM | POA: Diagnosis not present

## 2013-04-22 DIAGNOSIS — N186 End stage renal disease: Secondary | ICD-10-CM | POA: Diagnosis not present

## 2013-04-22 DIAGNOSIS — N039 Chronic nephritic syndrome with unspecified morphologic changes: Secondary | ICD-10-CM | POA: Diagnosis not present

## 2013-04-23 DIAGNOSIS — Z7682 Awaiting organ transplant status: Secondary | ICD-10-CM | POA: Diagnosis not present

## 2013-04-23 DIAGNOSIS — N186 End stage renal disease: Secondary | ICD-10-CM | POA: Diagnosis not present

## 2013-04-23 DIAGNOSIS — M4712 Other spondylosis with myelopathy, cervical region: Secondary | ICD-10-CM | POA: Diagnosis not present

## 2013-04-23 DIAGNOSIS — Z01810 Encounter for preprocedural cardiovascular examination: Secondary | ICD-10-CM | POA: Diagnosis not present

## 2013-04-23 DIAGNOSIS — M542 Cervicalgia: Secondary | ICD-10-CM | POA: Diagnosis not present

## 2013-04-23 DIAGNOSIS — I129 Hypertensive chronic kidney disease with stage 1 through stage 4 chronic kidney disease, or unspecified chronic kidney disease: Secondary | ICD-10-CM | POA: Diagnosis not present

## 2013-04-24 DIAGNOSIS — N039 Chronic nephritic syndrome with unspecified morphologic changes: Secondary | ICD-10-CM | POA: Diagnosis not present

## 2013-04-24 DIAGNOSIS — Z23 Encounter for immunization: Secondary | ICD-10-CM | POA: Diagnosis not present

## 2013-04-24 DIAGNOSIS — N2581 Secondary hyperparathyroidism of renal origin: Secondary | ICD-10-CM | POA: Diagnosis not present

## 2013-04-24 DIAGNOSIS — N186 End stage renal disease: Secondary | ICD-10-CM | POA: Diagnosis not present

## 2013-04-24 DIAGNOSIS — D631 Anemia in chronic kidney disease: Secondary | ICD-10-CM | POA: Diagnosis not present

## 2013-04-27 DIAGNOSIS — Z23 Encounter for immunization: Secondary | ICD-10-CM | POA: Diagnosis not present

## 2013-04-27 DIAGNOSIS — N186 End stage renal disease: Secondary | ICD-10-CM | POA: Diagnosis not present

## 2013-04-27 DIAGNOSIS — N2581 Secondary hyperparathyroidism of renal origin: Secondary | ICD-10-CM | POA: Diagnosis not present

## 2013-04-27 DIAGNOSIS — D631 Anemia in chronic kidney disease: Secondary | ICD-10-CM | POA: Diagnosis not present

## 2013-04-28 ENCOUNTER — Encounter: Payer: Self-pay | Admitting: Internal Medicine

## 2013-04-28 ENCOUNTER — Ambulatory Visit (INDEPENDENT_AMBULATORY_CARE_PROVIDER_SITE_OTHER): Payer: Medicare Other | Admitting: Internal Medicine

## 2013-04-28 VITALS — BP 134/78 | HR 80 | Temp 98.7°F | Ht 67.5 in | Wt 240.0 lb

## 2013-04-28 DIAGNOSIS — Z940 Kidney transplant status: Secondary | ICD-10-CM

## 2013-04-28 DIAGNOSIS — R609 Edema, unspecified: Secondary | ICD-10-CM | POA: Diagnosis not present

## 2013-04-28 DIAGNOSIS — I10 Essential (primary) hypertension: Secondary | ICD-10-CM | POA: Diagnosis not present

## 2013-04-28 DIAGNOSIS — R51 Headache: Secondary | ICD-10-CM

## 2013-04-28 DIAGNOSIS — N186 End stage renal disease: Secondary | ICD-10-CM

## 2013-04-28 DIAGNOSIS — E1129 Type 2 diabetes mellitus with other diabetic kidney complication: Secondary | ICD-10-CM

## 2013-04-28 DIAGNOSIS — E1165 Type 2 diabetes mellitus with hyperglycemia: Secondary | ICD-10-CM

## 2013-04-28 DIAGNOSIS — R519 Headache, unspecified: Secondary | ICD-10-CM | POA: Insufficient documentation

## 2013-04-28 DIAGNOSIS — E669 Obesity, unspecified: Secondary | ICD-10-CM | POA: Diagnosis not present

## 2013-04-28 DIAGNOSIS — E785 Hyperlipidemia, unspecified: Secondary | ICD-10-CM

## 2013-04-28 MED ORDER — CALCIUM ACETATE 667 MG PO CAPS: ORAL_CAPSULE | ORAL | Status: DC

## 2013-04-28 MED ORDER — TRAMADOL HCL 50 MG PO TABS: ORAL_TABLET | ORAL | Status: DC

## 2013-04-28 NOTE — Progress Notes (Signed)
Patient ID: Kirk [name], male   DOB: 24-Jul-1960, 53 y.o.   MRN: [id number]    Location:  [facility]   Place of Service: OFFICE    Allergies  Allergen Reactions  . Codeine Nausea And Vomiting and Nausea Only    Chief Complaint  Patient presents with  . Medical Managment of Chronic Issues    3 month follow-up   . Medication Refill    Renew Tramadol, Order for CPAP equipment to [facility]     HPI:  I have done an extensive review of records from [place] this visit.  ESRD (end stage renal disease): transplant failed. Back on dialysis. He is undergoing evaluation at [facility] to see if he is appropriate for another transplant  Edema: improved  HTN (hypertension) : controlled  Obesity: no progress in weight loss. He is noncompliant with diet.He is resistant to suggestions.  Type II or unspecified type diabetes mellitus with renal manifestations, uncontrolled(250.42) : diabetes is under control  History of kidney transplant: transplant failed in 2014  Headache(784.0) : frequent. Tramadol helps  Hyperlipidemia -: no recent lab.  Dental abscess.: has had several antibiotic courses. Has seen dentist.    Medications: Patient's Medications  New Prescriptions   No medications on file  Previous Medications   ACETAMINOPHEN (TYLENOL) 500 MG TABLET    Take by mouth.   ALBUTEROL-IPRATROPIUM (COMBIVENT) 18-103 MCG/ACT INHALER    Inhale 2 puffs into the lungs every 6 (six) hours as needed for wheezing.   AMLODIPINE (NORVASC) 10 MG TABLET    Take 10 mg by mouth daily.   AMOXICILLIN (AMOXIL) 500 MG CAPSULE    Take 4 capsules (2,000 mg) 30 minutes before dental procedure   ASPIRIN EC 81 MG TABLET    Take 81 mg by mouth daily.   B COMPLEX-C-FOLIC ACID (NEPHRO-VITE PO)    Take by mouth.   B COMPLEX-VITAMIN C-FOLIC ACID (NEPHRO-VITE) 0.8 MG TABS TABLET    TAKE 1 TABLET BY MOUTH DAILY.   CARVEDILOL (COREG) 25 MG TABLET    Take one tablet in the morning and one tablet in the evening   CHOLECALCIFEROL  5000 UNITS TABS    Take 5,000 Units by mouth every morning.   CIPROFLOXACIN (CIPRO) 500 MG TABLET    Take by mouth.   DIPHENHYDRAMINE (BENADRYL) 25 MG TABLET    Take 25 mg by mouth 2 (two) times daily as needed.   DOXAZOSIN (CARDURA) 4 MG TABLET    Take one tablet by mouth once daily to regulate blood pressure   FUROSEMIDE (LASIX) 20 MG TABLET    Take by mouth.   HYDRALAZINE (APRESOLINE) 25 MG TABLET    Take 25 mg by mouth 3 (three) times daily. Take 3 tabs every 6 hours by mouth   HYDROCODONE-ACETAMINOPHEN (NORCO/VICODIN) 5-325 MG PER TABLET       ONDANSETRON HCL PO    Take 4 mg by mouth daily as needed.    PRAVASTATIN (PRAVACHOL) 40 MG TABLET    Take one tablet by mouth once daily   PREDNISONE (DELTASONE) 5 MG TABLET    Take 5 mg by mouth daily.   TACROLIMUS (PROGRAF) 1 MG CAPSULE    Take 4 mg by mouth 2 (two) times daily.   Modified Medications   Modified Medication Previous Medication   CALCIUM ACETATE (PHOSLO) 667 MG CAPSULE calcium acetate (PHOSLO) 667 MG capsule      4 by mouth with each meal and 2 by mouth with snacks    Take 667  mg by mouth 3 (three) times daily with meals. 4 by mouth with each meal and 2 by mouth with snacks   TRAMADOL (ULTRAM) 50 MG TABLET traMADol (ULTRAM) 50 MG tablet      Take one tablet every 6 hours as needed for pain    Take 50 mg by mouth every 6 (six) hours as needed for pain. Take one tablet every 6 hours as needed for pain  Discontinued Medications   METRONIDAZOLE (FLAGYL) 500 MG TABLET    Take 1 tablet (500 mg total) by mouth 3 (three) times daily.   MULTIPLE VITAMINS-MINERALS (MULTIVITAMIN WITH MINERALS) TABLET    Take by mouth.   MYCOPHENOLATE (MYFORTIC) 360 MG TBEC    Take 360 mg by mouth 2 (two) times daily. Take 360mg  in the am and 180 mg in the pm.   OMEPRAZOLE (PRILOSEC) 20 MG CAPSULE    Take 20 mg by mouth every morning.   PROBIOTIC PRODUCT (PROBIOTIC PO)    Take 2 capsules by mouth 2 (two) times daily.   SULFAMETHOXAZOLE-TRIMETHOPRIM (BACTRIM DS)  800-160 MG PER TABLET    Take 1 tablet by mouth 3 (three) times a week.   TRAMADOL-ACETAMINOPHEN (ULTRACET) 37.5-325 MG PER TABLET         Review of Systems  Constitutional: Negative for fever.       Chronically fatigued.  Eyes: Negative.   Respiratory: Negative for shortness of breath.        Dyspnea on exertion.  Cardiovascular: Negative for chest pain, palpitations and leg swelling.  Gastrointestinal: Negative for abdominal pain and abdominal distention.  Endocrine:       Diabetes is controlled.  Genitourinary: Negative.        Renal failure and kidney transplant.  Musculoskeletal:       Neck discomfort. Chronic right knee discomfort. Chronic back pains. Unsteady gait.  Neurological:       Memory is worse. Feet feel numb.  Hematological:       Anemia of CKD.  Psychiatric/Behavioral: Positive for confusion and decreased concentration.       Insomnia. Falls asleep in the day.    Filed Vitals:   04/28/13 1601  BP: 134/78  Pulse: 80  Temp: 98.7 F (37.1 C)  TempSrc: Oral  Height: 5' 7.5" (1.715 m)  Weight: 240 lb (108.863 kg)  SpO2: 97%   Physical Exam  Constitutional: He is oriented to person, place, and time.  Obese.  HENT:  Loss of hearing.  Eyes:  Corrective lenses  Neck: No JVD present. No tracheal deviation present. No thyromegaly present.  Cardiovascular: Exam reveals no gallop.   No murmur heard. AF. 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  Abdominal: Soft. Bowel sounds are normal. He exhibits no distension and no mass. There is no tenderness.  Musculoskeletal: Normal range of motion. He exhibits edema and tenderness.  Scar left knee. Tender right knee  Lymphadenopathy:    He has no cervical adenopathy.  Neurological: He is alert and oriented to person, place, and time. No cranial nerve deficit. Coordination normal.  Positive Tinel sign bilaterally  Skin: No rash noted. No erythema. No pallor.  Psychiatric:  Slow. Depressed acting.     Labs reviewed: No  visits with results within 3 Month(s) from this visit. Latest known visit with results is:  Office Visit on 01/28/2013  Component Date Value Ref Range Status  . Glucose 01/28/2013 120* 65 - 99 mg/dL Final  . BUN 01/28/2013 27* 6 - 24 mg/dL Final  . Creatinine,  Ser 01/28/2013 7.68* 0.76 - 1.27 mg/dL Final  . GFR calc non Af Amer 01/28/2013 7* >59 mL/min/1.73 Final  . GFR calc Af Amer 01/28/2013 9* >59 mL/min/1.73 Final  . BUN/Creatinine Ratio 01/28/2013 4* 9 - 20 Final  . Sodium 01/28/2013 144  134 - 144 mmol/L Final  . Potassium 01/28/2013 4.1  3.5 - 5.2 mmol/L Final  . Chloride 01/28/2013 100  97 - 108 mmol/L Final  . CO2 01/28/2013 26  18 - 29 mmol/L Final  . Calcium 01/28/2013 9.0  8.7 - 10.2 mg/dL Final   Comment: **Effective February 09, 2013 the reference interval**                            for Calcium, Serum will be changing to:                                       Age                Male          Male                                    0 - 10 days        8.6 - 10.4     8.6 - 10.4                              11 days -  1 year        9.2 - 11.0     9.2 - 11.0                                    2 - 11 years       9.1 - 10.5     9.1 - 10.5                                   12 - 17 years       8.9 - 10.4     8.9 - 10.4                                   18 - 59 years       8.7 - 10.2     8.7 - 10.2                                       >59 years       8.6 - 10.2     8.7 - 10.3  . Total Protein 01/28/2013 6.6  6.0 - 8.5 g/dL Final  . Albumin 01/28/2013 4.7  3.5 - 5.5 g/dL Final  . Globulin, Total 01/28/2013 1.9  1.5 - 4.5 g/dL Final  . Albumin/Globulin Ratio 01/28/2013 2.5  1.1 - 2.5 Final  . Total Bilirubin 01/28/2013 0.3  0.0 - 1.2 mg/dL Final  . Alkaline Phosphatase 01/28/2013 78  39 - 117  IU/L Final  . AST 01/28/2013 15  0 - 40 IU/L Final  . ALT 01/28/2013 16  0 - 44 IU/L Final  . C difficile by pcr 01/28/2013 Positive* Negative Final   Comment: Toxigenic C difficile:  Positive                          Epidemic Strain Bl/NAP1/027: Presumptive Negative  . WBC 01/28/2013 4.2  3.4 - 10.8 x10E3/uL Final  . RBC 01/28/2013 3.41* 4.14 - 5.80 x10E6/uL Final  . Hemoglobin 01/28/2013 10.3* 12.6 - 17.7 g/dL Final  . HCT 01/28/2013 31.5* 37.5 - 51.0 % Final  . MCV 01/28/2013 92  79 - 97 fL Final  . MCH 01/28/2013 30.2  26.6 - 33.0 pg Final  . MCHC 01/28/2013 32.7  31.5 - 35.7 g/dL Final  . RDW 01/28/2013 14.7  12.3 - 15.4 % Final  . Platelets 01/28/2013 121* 150 - 379 x10E3/uL Final  . Neutrophils Relative % 01/28/2013 75   Final  . Lymphs 01/28/2013 14   Final  . Monocytes 01/28/2013 10   Final  . Eos 01/28/2013 0   Final  . Basos 01/28/2013 1   Final  . Neutrophils Absolute 01/28/2013 3.2  1.4 - 7.0 x10E3/uL Final  . Lymphocytes Absolute 01/28/2013 0.6* 0.7 - 3.1 x10E3/uL Final  . Monocytes Absolute 01/28/2013 0.4  0.1 - 0.9 x10E3/uL Final  . Eosinophils Absolute 01/28/2013 0.0  0.0 - 0.4 x10E3/uL Final  . Basophils Absolute 01/28/2013 0.0  0.0 - 0.2 x10E3/uL Final  . Immature Granulocytes 01/28/2013 0   Final  . Immature Grans (Abs) 01/28/2013 0.0  0.0 - 0.1 x10E3/uL Final   Had lab in March at [facility]: glu 155   Assessment/Plan 1. Edema improved  2. HTN (hypertension) controlled - Comprehensive metabolic panel; Future  3. Obesity Encouraged dietary compliance and weight loss.  4. Type II or unspecified type diabetes mellitus with renal manifestations, uncontrolled(250.42) - Comprehensive metabolic panel; Future - Hemoglobin A1c; Future  5. History of kidney transplant Failed transplant. He is being evaluated for repeat transplant  6. ESRD (end stage renal disease) Continue dialysis - calcium acetate (PHOSLO) 667 MG capsule; 4 by mouth with each meal and 2 by mouth with snacks  7. Headache(784.0) - traMADol (ULTRAM) 50 MG tablet; Take one tablet every 6 hours as needed for pain  Dispense: 100 tablet; Refill: 3  8. Hyperlipidemia - Lipid  panel; Future

## 2013-04-28 NOTE — Patient Instructions (Signed)
Continue current medications. 

## 2013-04-29 ENCOUNTER — Telehealth: Payer: Self-pay | Admitting: *Deleted

## 2013-04-29 DIAGNOSIS — N2581 Secondary hyperparathyroidism of renal origin: Secondary | ICD-10-CM | POA: Diagnosis not present

## 2013-04-29 DIAGNOSIS — Z23 Encounter for immunization: Secondary | ICD-10-CM | POA: Diagnosis not present

## 2013-04-29 DIAGNOSIS — N186 End stage renal disease: Secondary | ICD-10-CM | POA: Diagnosis not present

## 2013-04-29 DIAGNOSIS — D631 Anemia in chronic kidney disease: Secondary | ICD-10-CM | POA: Diagnosis not present

## 2013-04-29 NOTE — Telephone Encounter (Signed)
Called [facility] # [phone] and spoke with Roxanne and ordered CPAP Supplies. Will be shipping out to patient.  Called and informed Mechele Claude

## 2013-05-01 DIAGNOSIS — D631 Anemia in chronic kidney disease: Secondary | ICD-10-CM | POA: Diagnosis not present

## 2013-05-01 DIAGNOSIS — Z23 Encounter for immunization: Secondary | ICD-10-CM | POA: Diagnosis not present

## 2013-05-01 DIAGNOSIS — N2581 Secondary hyperparathyroidism of renal origin: Secondary | ICD-10-CM | POA: Diagnosis not present

## 2013-05-01 DIAGNOSIS — N186 End stage renal disease: Secondary | ICD-10-CM | POA: Diagnosis not present

## 2013-05-04 DIAGNOSIS — N186 End stage renal disease: Secondary | ICD-10-CM | POA: Diagnosis not present

## 2013-05-04 DIAGNOSIS — N2581 Secondary hyperparathyroidism of renal origin: Secondary | ICD-10-CM | POA: Diagnosis not present

## 2013-05-04 DIAGNOSIS — D631 Anemia in chronic kidney disease: Secondary | ICD-10-CM | POA: Diagnosis not present

## 2013-05-04 DIAGNOSIS — Z23 Encounter for immunization: Secondary | ICD-10-CM | POA: Diagnosis not present

## 2013-05-06 DIAGNOSIS — D631 Anemia in chronic kidney disease: Secondary | ICD-10-CM | POA: Diagnosis not present

## 2013-05-06 DIAGNOSIS — N039 Chronic nephritic syndrome with unspecified morphologic changes: Secondary | ICD-10-CM | POA: Diagnosis not present

## 2013-05-06 DIAGNOSIS — N2581 Secondary hyperparathyroidism of renal origin: Secondary | ICD-10-CM | POA: Diagnosis not present

## 2013-05-06 DIAGNOSIS — Z23 Encounter for immunization: Secondary | ICD-10-CM | POA: Diagnosis not present

## 2013-05-06 DIAGNOSIS — N186 End stage renal disease: Secondary | ICD-10-CM | POA: Diagnosis not present

## 2013-05-08 DIAGNOSIS — N186 End stage renal disease: Secondary | ICD-10-CM | POA: Diagnosis not present

## 2013-05-08 DIAGNOSIS — Z23 Encounter for immunization: Secondary | ICD-10-CM | POA: Diagnosis not present

## 2013-05-08 DIAGNOSIS — D631 Anemia in chronic kidney disease: Secondary | ICD-10-CM | POA: Diagnosis not present

## 2013-05-08 DIAGNOSIS — N2581 Secondary hyperparathyroidism of renal origin: Secondary | ICD-10-CM | POA: Diagnosis not present

## 2013-05-11 DIAGNOSIS — N2581 Secondary hyperparathyroidism of renal origin: Secondary | ICD-10-CM | POA: Diagnosis not present

## 2013-05-11 DIAGNOSIS — Z23 Encounter for immunization: Secondary | ICD-10-CM | POA: Diagnosis not present

## 2013-05-11 DIAGNOSIS — N186 End stage renal disease: Secondary | ICD-10-CM | POA: Diagnosis not present

## 2013-05-11 DIAGNOSIS — D631 Anemia in chronic kidney disease: Secondary | ICD-10-CM | POA: Diagnosis not present

## 2013-05-13 DIAGNOSIS — N039 Chronic nephritic syndrome with unspecified morphologic changes: Secondary | ICD-10-CM | POA: Diagnosis not present

## 2013-05-13 DIAGNOSIS — N2581 Secondary hyperparathyroidism of renal origin: Secondary | ICD-10-CM | POA: Diagnosis not present

## 2013-05-13 DIAGNOSIS — Z23 Encounter for immunization: Secondary | ICD-10-CM | POA: Diagnosis not present

## 2013-05-13 DIAGNOSIS — D631 Anemia in chronic kidney disease: Secondary | ICD-10-CM | POA: Diagnosis not present

## 2013-05-13 DIAGNOSIS — N186 End stage renal disease: Secondary | ICD-10-CM | POA: Diagnosis not present

## 2013-05-15 DIAGNOSIS — D631 Anemia in chronic kidney disease: Secondary | ICD-10-CM | POA: Diagnosis not present

## 2013-05-15 DIAGNOSIS — N2581 Secondary hyperparathyroidism of renal origin: Secondary | ICD-10-CM | POA: Diagnosis not present

## 2013-05-15 DIAGNOSIS — N186 End stage renal disease: Secondary | ICD-10-CM | POA: Diagnosis not present

## 2013-05-15 DIAGNOSIS — Z23 Encounter for immunization: Secondary | ICD-10-CM | POA: Diagnosis not present

## 2013-05-18 DIAGNOSIS — D631 Anemia in chronic kidney disease: Secondary | ICD-10-CM | POA: Diagnosis not present

## 2013-05-18 DIAGNOSIS — N2581 Secondary hyperparathyroidism of renal origin: Secondary | ICD-10-CM | POA: Diagnosis not present

## 2013-05-18 DIAGNOSIS — N186 End stage renal disease: Secondary | ICD-10-CM | POA: Diagnosis not present

## 2013-05-18 DIAGNOSIS — Z23 Encounter for immunization: Secondary | ICD-10-CM | POA: Diagnosis not present

## 2013-05-20 ENCOUNTER — Other Ambulatory Visit: Payer: Self-pay | Admitting: Nurse Practitioner

## 2013-05-20 DIAGNOSIS — N039 Chronic nephritic syndrome with unspecified morphologic changes: Secondary | ICD-10-CM | POA: Diagnosis not present

## 2013-05-20 DIAGNOSIS — D631 Anemia in chronic kidney disease: Secondary | ICD-10-CM | POA: Diagnosis not present

## 2013-05-20 DIAGNOSIS — N2581 Secondary hyperparathyroidism of renal origin: Secondary | ICD-10-CM | POA: Diagnosis not present

## 2013-05-20 DIAGNOSIS — Z23 Encounter for immunization: Secondary | ICD-10-CM | POA: Diagnosis not present

## 2013-05-20 DIAGNOSIS — N186 End stage renal disease: Secondary | ICD-10-CM | POA: Diagnosis not present

## 2013-05-21 DIAGNOSIS — N186 End stage renal disease: Secondary | ICD-10-CM | POA: Diagnosis not present

## 2013-05-22 DIAGNOSIS — N2581 Secondary hyperparathyroidism of renal origin: Secondary | ICD-10-CM | POA: Diagnosis not present

## 2013-05-22 DIAGNOSIS — N186 End stage renal disease: Secondary | ICD-10-CM | POA: Diagnosis not present

## 2013-05-22 DIAGNOSIS — N039 Chronic nephritic syndrome with unspecified morphologic changes: Secondary | ICD-10-CM | POA: Diagnosis not present

## 2013-05-22 DIAGNOSIS — D631 Anemia in chronic kidney disease: Secondary | ICD-10-CM | POA: Diagnosis not present

## 2013-05-25 DIAGNOSIS — N186 End stage renal disease: Secondary | ICD-10-CM | POA: Diagnosis not present

## 2013-05-25 DIAGNOSIS — D631 Anemia in chronic kidney disease: Secondary | ICD-10-CM | POA: Diagnosis not present

## 2013-05-25 DIAGNOSIS — N2581 Secondary hyperparathyroidism of renal origin: Secondary | ICD-10-CM | POA: Diagnosis not present

## 2013-05-27 DIAGNOSIS — D631 Anemia in chronic kidney disease: Secondary | ICD-10-CM | POA: Diagnosis not present

## 2013-05-27 DIAGNOSIS — N2581 Secondary hyperparathyroidism of renal origin: Secondary | ICD-10-CM | POA: Diagnosis not present

## 2013-05-27 DIAGNOSIS — N186 End stage renal disease: Secondary | ICD-10-CM | POA: Diagnosis not present

## 2013-05-29 DIAGNOSIS — N039 Chronic nephritic syndrome with unspecified morphologic changes: Secondary | ICD-10-CM | POA: Diagnosis not present

## 2013-05-29 DIAGNOSIS — N2581 Secondary hyperparathyroidism of renal origin: Secondary | ICD-10-CM | POA: Diagnosis not present

## 2013-05-29 DIAGNOSIS — N186 End stage renal disease: Secondary | ICD-10-CM | POA: Diagnosis not present

## 2013-05-29 DIAGNOSIS — D631 Anemia in chronic kidney disease: Secondary | ICD-10-CM | POA: Diagnosis not present

## 2013-06-01 DIAGNOSIS — N186 End stage renal disease: Secondary | ICD-10-CM | POA: Diagnosis not present

## 2013-06-01 DIAGNOSIS — D631 Anemia in chronic kidney disease: Secondary | ICD-10-CM | POA: Diagnosis not present

## 2013-06-01 DIAGNOSIS — N2581 Secondary hyperparathyroidism of renal origin: Secondary | ICD-10-CM | POA: Diagnosis not present

## 2013-06-03 DIAGNOSIS — N2581 Secondary hyperparathyroidism of renal origin: Secondary | ICD-10-CM | POA: Diagnosis not present

## 2013-06-03 DIAGNOSIS — D631 Anemia in chronic kidney disease: Secondary | ICD-10-CM | POA: Diagnosis not present

## 2013-06-03 DIAGNOSIS — N186 End stage renal disease: Secondary | ICD-10-CM | POA: Diagnosis not present

## 2013-06-05 DIAGNOSIS — D631 Anemia in chronic kidney disease: Secondary | ICD-10-CM | POA: Diagnosis not present

## 2013-06-05 DIAGNOSIS — N186 End stage renal disease: Secondary | ICD-10-CM | POA: Diagnosis not present

## 2013-06-05 DIAGNOSIS — N2581 Secondary hyperparathyroidism of renal origin: Secondary | ICD-10-CM | POA: Diagnosis not present

## 2013-06-08 DIAGNOSIS — D631 Anemia in chronic kidney disease: Secondary | ICD-10-CM | POA: Diagnosis not present

## 2013-06-08 DIAGNOSIS — N186 End stage renal disease: Secondary | ICD-10-CM | POA: Diagnosis not present

## 2013-06-08 DIAGNOSIS — N2581 Secondary hyperparathyroidism of renal origin: Secondary | ICD-10-CM | POA: Diagnosis not present

## 2013-06-10 DIAGNOSIS — N2581 Secondary hyperparathyroidism of renal origin: Secondary | ICD-10-CM | POA: Diagnosis not present

## 2013-06-10 DIAGNOSIS — D631 Anemia in chronic kidney disease: Secondary | ICD-10-CM | POA: Diagnosis not present

## 2013-06-10 DIAGNOSIS — N186 End stage renal disease: Secondary | ICD-10-CM | POA: Diagnosis not present

## 2013-06-12 DIAGNOSIS — D631 Anemia in chronic kidney disease: Secondary | ICD-10-CM | POA: Diagnosis not present

## 2013-06-12 DIAGNOSIS — N2581 Secondary hyperparathyroidism of renal origin: Secondary | ICD-10-CM | POA: Diagnosis not present

## 2013-06-12 DIAGNOSIS — N186 End stage renal disease: Secondary | ICD-10-CM | POA: Diagnosis not present

## 2013-06-15 DIAGNOSIS — D631 Anemia in chronic kidney disease: Secondary | ICD-10-CM | POA: Diagnosis not present

## 2013-06-15 DIAGNOSIS — N186 End stage renal disease: Secondary | ICD-10-CM | POA: Diagnosis not present

## 2013-06-15 DIAGNOSIS — N2581 Secondary hyperparathyroidism of renal origin: Secondary | ICD-10-CM | POA: Diagnosis not present

## 2013-06-17 DIAGNOSIS — N2581 Secondary hyperparathyroidism of renal origin: Secondary | ICD-10-CM | POA: Diagnosis not present

## 2013-06-17 DIAGNOSIS — N186 End stage renal disease: Secondary | ICD-10-CM | POA: Diagnosis not present

## 2013-06-17 DIAGNOSIS — D631 Anemia in chronic kidney disease: Secondary | ICD-10-CM | POA: Diagnosis not present

## 2013-06-19 DIAGNOSIS — N186 End stage renal disease: Secondary | ICD-10-CM | POA: Diagnosis not present

## 2013-06-19 DIAGNOSIS — N2581 Secondary hyperparathyroidism of renal origin: Secondary | ICD-10-CM | POA: Diagnosis not present

## 2013-06-19 DIAGNOSIS — N039 Chronic nephritic syndrome with unspecified morphologic changes: Secondary | ICD-10-CM | POA: Diagnosis not present

## 2013-06-19 DIAGNOSIS — D631 Anemia in chronic kidney disease: Secondary | ICD-10-CM | POA: Diagnosis not present

## 2013-06-21 DIAGNOSIS — N186 End stage renal disease: Secondary | ICD-10-CM | POA: Diagnosis not present

## 2013-06-22 DIAGNOSIS — D509 Iron deficiency anemia, unspecified: Secondary | ICD-10-CM | POA: Diagnosis not present

## 2013-06-22 DIAGNOSIS — D631 Anemia in chronic kidney disease: Secondary | ICD-10-CM | POA: Diagnosis not present

## 2013-06-22 DIAGNOSIS — N039 Chronic nephritic syndrome with unspecified morphologic changes: Secondary | ICD-10-CM | POA: Diagnosis not present

## 2013-06-22 DIAGNOSIS — N186 End stage renal disease: Secondary | ICD-10-CM | POA: Diagnosis not present

## 2013-06-22 DIAGNOSIS — N2581 Secondary hyperparathyroidism of renal origin: Secondary | ICD-10-CM | POA: Diagnosis not present

## 2013-06-24 DIAGNOSIS — N186 End stage renal disease: Secondary | ICD-10-CM | POA: Diagnosis not present

## 2013-06-24 DIAGNOSIS — N039 Chronic nephritic syndrome with unspecified morphologic changes: Secondary | ICD-10-CM | POA: Diagnosis not present

## 2013-06-24 DIAGNOSIS — D631 Anemia in chronic kidney disease: Secondary | ICD-10-CM | POA: Diagnosis not present

## 2013-06-24 DIAGNOSIS — D509 Iron deficiency anemia, unspecified: Secondary | ICD-10-CM | POA: Diagnosis not present

## 2013-06-24 DIAGNOSIS — N2581 Secondary hyperparathyroidism of renal origin: Secondary | ICD-10-CM | POA: Diagnosis not present

## 2013-06-26 DIAGNOSIS — N2581 Secondary hyperparathyroidism of renal origin: Secondary | ICD-10-CM | POA: Diagnosis not present

## 2013-06-26 DIAGNOSIS — N186 End stage renal disease: Secondary | ICD-10-CM | POA: Diagnosis not present

## 2013-06-26 DIAGNOSIS — D631 Anemia in chronic kidney disease: Secondary | ICD-10-CM | POA: Diagnosis not present

## 2013-06-26 DIAGNOSIS — D509 Iron deficiency anemia, unspecified: Secondary | ICD-10-CM | POA: Diagnosis not present

## 2013-06-26 DIAGNOSIS — N039 Chronic nephritic syndrome with unspecified morphologic changes: Secondary | ICD-10-CM | POA: Diagnosis not present

## 2013-06-29 DIAGNOSIS — Z125 Encounter for screening for malignant neoplasm of prostate: Secondary | ICD-10-CM | POA: Diagnosis not present

## 2013-06-29 DIAGNOSIS — N186 End stage renal disease: Secondary | ICD-10-CM | POA: Diagnosis not present

## 2013-06-29 DIAGNOSIS — N2581 Secondary hyperparathyroidism of renal origin: Secondary | ICD-10-CM | POA: Diagnosis not present

## 2013-06-29 DIAGNOSIS — D631 Anemia in chronic kidney disease: Secondary | ICD-10-CM | POA: Diagnosis not present

## 2013-06-29 DIAGNOSIS — D509 Iron deficiency anemia, unspecified: Secondary | ICD-10-CM | POA: Diagnosis not present

## 2013-07-01 DIAGNOSIS — D509 Iron deficiency anemia, unspecified: Secondary | ICD-10-CM | POA: Diagnosis not present

## 2013-07-01 DIAGNOSIS — D631 Anemia in chronic kidney disease: Secondary | ICD-10-CM | POA: Diagnosis not present

## 2013-07-01 DIAGNOSIS — N186 End stage renal disease: Secondary | ICD-10-CM | POA: Diagnosis not present

## 2013-07-01 DIAGNOSIS — N2581 Secondary hyperparathyroidism of renal origin: Secondary | ICD-10-CM | POA: Diagnosis not present

## 2013-07-01 DIAGNOSIS — N039 Chronic nephritic syndrome with unspecified morphologic changes: Secondary | ICD-10-CM | POA: Diagnosis not present

## 2013-07-03 DIAGNOSIS — D509 Iron deficiency anemia, unspecified: Secondary | ICD-10-CM | POA: Diagnosis not present

## 2013-07-03 DIAGNOSIS — N186 End stage renal disease: Secondary | ICD-10-CM | POA: Diagnosis not present

## 2013-07-03 DIAGNOSIS — N039 Chronic nephritic syndrome with unspecified morphologic changes: Secondary | ICD-10-CM | POA: Diagnosis not present

## 2013-07-03 DIAGNOSIS — N2581 Secondary hyperparathyroidism of renal origin: Secondary | ICD-10-CM | POA: Diagnosis not present

## 2013-07-03 DIAGNOSIS — D631 Anemia in chronic kidney disease: Secondary | ICD-10-CM | POA: Diagnosis not present

## 2013-07-06 DIAGNOSIS — D509 Iron deficiency anemia, unspecified: Secondary | ICD-10-CM | POA: Diagnosis not present

## 2013-07-06 DIAGNOSIS — N2581 Secondary hyperparathyroidism of renal origin: Secondary | ICD-10-CM | POA: Diagnosis not present

## 2013-07-06 DIAGNOSIS — D631 Anemia in chronic kidney disease: Secondary | ICD-10-CM | POA: Diagnosis not present

## 2013-07-06 DIAGNOSIS — N039 Chronic nephritic syndrome with unspecified morphologic changes: Secondary | ICD-10-CM | POA: Diagnosis not present

## 2013-07-06 DIAGNOSIS — N186 End stage renal disease: Secondary | ICD-10-CM | POA: Diagnosis not present

## 2013-07-06 DIAGNOSIS — R300 Dysuria: Secondary | ICD-10-CM | POA: Diagnosis not present

## 2013-07-08 DIAGNOSIS — D509 Iron deficiency anemia, unspecified: Secondary | ICD-10-CM | POA: Diagnosis not present

## 2013-07-08 DIAGNOSIS — N2581 Secondary hyperparathyroidism of renal origin: Secondary | ICD-10-CM | POA: Diagnosis not present

## 2013-07-08 DIAGNOSIS — N186 End stage renal disease: Secondary | ICD-10-CM | POA: Diagnosis not present

## 2013-07-08 DIAGNOSIS — D631 Anemia in chronic kidney disease: Secondary | ICD-10-CM | POA: Diagnosis not present

## 2013-07-10 DIAGNOSIS — N039 Chronic nephritic syndrome with unspecified morphologic changes: Secondary | ICD-10-CM | POA: Diagnosis not present

## 2013-07-10 DIAGNOSIS — D509 Iron deficiency anemia, unspecified: Secondary | ICD-10-CM | POA: Diagnosis not present

## 2013-07-10 DIAGNOSIS — N186 End stage renal disease: Secondary | ICD-10-CM | POA: Diagnosis not present

## 2013-07-10 DIAGNOSIS — N2581 Secondary hyperparathyroidism of renal origin: Secondary | ICD-10-CM | POA: Diagnosis not present

## 2013-07-10 DIAGNOSIS — D631 Anemia in chronic kidney disease: Secondary | ICD-10-CM | POA: Diagnosis not present

## 2013-07-13 DIAGNOSIS — D509 Iron deficiency anemia, unspecified: Secondary | ICD-10-CM | POA: Diagnosis not present

## 2013-07-13 DIAGNOSIS — N186 End stage renal disease: Secondary | ICD-10-CM | POA: Diagnosis not present

## 2013-07-13 DIAGNOSIS — D631 Anemia in chronic kidney disease: Secondary | ICD-10-CM | POA: Diagnosis not present

## 2013-07-13 DIAGNOSIS — N2581 Secondary hyperparathyroidism of renal origin: Secondary | ICD-10-CM | POA: Diagnosis not present

## 2013-07-15 DIAGNOSIS — D631 Anemia in chronic kidney disease: Secondary | ICD-10-CM | POA: Diagnosis not present

## 2013-07-15 DIAGNOSIS — N2581 Secondary hyperparathyroidism of renal origin: Secondary | ICD-10-CM | POA: Diagnosis not present

## 2013-07-15 DIAGNOSIS — N039 Chronic nephritic syndrome with unspecified morphologic changes: Secondary | ICD-10-CM | POA: Diagnosis not present

## 2013-07-15 DIAGNOSIS — N186 End stage renal disease: Secondary | ICD-10-CM | POA: Diagnosis not present

## 2013-07-15 DIAGNOSIS — D509 Iron deficiency anemia, unspecified: Secondary | ICD-10-CM | POA: Diagnosis not present

## 2013-07-17 DIAGNOSIS — N2581 Secondary hyperparathyroidism of renal origin: Secondary | ICD-10-CM | POA: Diagnosis not present

## 2013-07-17 DIAGNOSIS — D509 Iron deficiency anemia, unspecified: Secondary | ICD-10-CM | POA: Diagnosis not present

## 2013-07-17 DIAGNOSIS — N039 Chronic nephritic syndrome with unspecified morphologic changes: Secondary | ICD-10-CM | POA: Diagnosis not present

## 2013-07-17 DIAGNOSIS — D631 Anemia in chronic kidney disease: Secondary | ICD-10-CM | POA: Diagnosis not present

## 2013-07-17 DIAGNOSIS — N186 End stage renal disease: Secondary | ICD-10-CM | POA: Diagnosis not present

## 2013-07-20 DIAGNOSIS — N2581 Secondary hyperparathyroidism of renal origin: Secondary | ICD-10-CM | POA: Diagnosis not present

## 2013-07-20 DIAGNOSIS — D509 Iron deficiency anemia, unspecified: Secondary | ICD-10-CM | POA: Diagnosis not present

## 2013-07-20 DIAGNOSIS — N186 End stage renal disease: Secondary | ICD-10-CM | POA: Diagnosis not present

## 2013-07-20 DIAGNOSIS — D631 Anemia in chronic kidney disease: Secondary | ICD-10-CM | POA: Diagnosis not present

## 2013-07-21 DIAGNOSIS — N186 End stage renal disease: Secondary | ICD-10-CM | POA: Diagnosis not present

## 2013-07-22 DIAGNOSIS — D509 Iron deficiency anemia, unspecified: Secondary | ICD-10-CM | POA: Diagnosis not present

## 2013-07-22 DIAGNOSIS — N186 End stage renal disease: Secondary | ICD-10-CM | POA: Diagnosis not present

## 2013-07-22 DIAGNOSIS — D631 Anemia in chronic kidney disease: Secondary | ICD-10-CM | POA: Diagnosis not present

## 2013-07-22 DIAGNOSIS — N2581 Secondary hyperparathyroidism of renal origin: Secondary | ICD-10-CM | POA: Diagnosis not present

## 2013-07-23 ENCOUNTER — Other Ambulatory Visit: Payer: Self-pay | Admitting: Internal Medicine

## 2013-07-23 ENCOUNTER — Telehealth: Payer: Self-pay

## 2013-07-23 DIAGNOSIS — R110 Nausea: Secondary | ICD-10-CM

## 2013-07-23 MED ORDER — ONDANSETRON HCL 4 MG PO TABS: ORAL_TABLET | ORAL | Status: DC

## 2013-07-23 NOTE — Telephone Encounter (Signed)
i sent the prescription.

## 2013-07-23 NOTE — Telephone Encounter (Signed)
Message left on triage VM, patient with vomiting spells that onset today x 3 episodes. Patient would like rx for Zofran sent to [facility] in [place]   I spoke with patient's wife, patient w/o any other symptoms. Patient with nausea off/on, and 3 vomiting spells. No available appointments for today, Dr.Green please advise

## 2013-07-23 NOTE — Telephone Encounter (Signed)
Left message informing patient.

## 2013-08-21 DIAGNOSIS — N186 End stage renal disease: Secondary | ICD-10-CM | POA: Diagnosis not present

## 2013-08-22 HISTORY — PX: NEPHRECTOMY: SHX65

## 2013-08-24 DIAGNOSIS — N2581 Secondary hyperparathyroidism of renal origin: Secondary | ICD-10-CM | POA: Diagnosis not present

## 2013-08-24 DIAGNOSIS — D631 Anemia in chronic kidney disease: Secondary | ICD-10-CM | POA: Diagnosis not present

## 2013-08-24 DIAGNOSIS — D509 Iron deficiency anemia, unspecified: Secondary | ICD-10-CM | POA: Diagnosis not present

## 2013-08-24 DIAGNOSIS — Z23 Encounter for immunization: Secondary | ICD-10-CM | POA: Diagnosis not present

## 2013-08-24 DIAGNOSIS — N186 End stage renal disease: Secondary | ICD-10-CM | POA: Diagnosis not present

## 2013-08-25 ENCOUNTER — Telehealth: Payer: Self-pay | Admitting: *Deleted

## 2013-08-25 NOTE — Telephone Encounter (Signed)
Patient wife called and stated that patient is having severe pain in the transplanted area at kidney last night. Stated that it was alittle better this morning but still has some discomfort.No fever and she called the dialysis doctor but he is not there today. She wanted your advice. Please advise.

## 2013-08-25 NOTE — Telephone Encounter (Signed)
LMOM to return call.

## 2013-08-25 NOTE — Telephone Encounter (Signed)
Use tramadol for pain 50-100 mg q6h until you can reach the dialysis doctors or the transplant surgeon.

## 2013-08-26 DIAGNOSIS — Z23 Encounter for immunization: Secondary | ICD-10-CM | POA: Diagnosis not present

## 2013-08-26 DIAGNOSIS — D509 Iron deficiency anemia, unspecified: Secondary | ICD-10-CM | POA: Diagnosis not present

## 2013-08-26 DIAGNOSIS — N2581 Secondary hyperparathyroidism of renal origin: Secondary | ICD-10-CM | POA: Diagnosis not present

## 2013-08-26 DIAGNOSIS — D631 Anemia in chronic kidney disease: Secondary | ICD-10-CM | POA: Diagnosis not present

## 2013-08-26 DIAGNOSIS — N186 End stage renal disease: Secondary | ICD-10-CM | POA: Diagnosis not present

## 2013-08-26 NOTE — Telephone Encounter (Signed)
Wife Notified and agreed.  

## 2013-08-27 ENCOUNTER — Other Ambulatory Visit: Payer: Medicare Other

## 2013-08-27 DIAGNOSIS — E1129 Type 2 diabetes mellitus with other diabetic kidney complication: Secondary | ICD-10-CM

## 2013-08-27 DIAGNOSIS — I10 Essential (primary) hypertension: Secondary | ICD-10-CM

## 2013-08-27 DIAGNOSIS — E785 Hyperlipidemia, unspecified: Secondary | ICD-10-CM | POA: Diagnosis not present

## 2013-08-27 DIAGNOSIS — E1165 Type 2 diabetes mellitus with hyperglycemia: Secondary | ICD-10-CM | POA: Diagnosis not present

## 2013-08-28 DIAGNOSIS — D631 Anemia in chronic kidney disease: Secondary | ICD-10-CM | POA: Diagnosis not present

## 2013-08-28 DIAGNOSIS — Z23 Encounter for immunization: Secondary | ICD-10-CM | POA: Diagnosis not present

## 2013-08-28 DIAGNOSIS — N186 End stage renal disease: Secondary | ICD-10-CM | POA: Diagnosis not present

## 2013-08-28 DIAGNOSIS — N2581 Secondary hyperparathyroidism of renal origin: Secondary | ICD-10-CM | POA: Diagnosis not present

## 2013-08-28 DIAGNOSIS — D509 Iron deficiency anemia, unspecified: Secondary | ICD-10-CM | POA: Diagnosis not present

## 2013-08-28 LAB — HEMOGLOBIN A1C
Est. average glucose Bld gHb Est-mCnc: 123 mg/dL
HEMOGLOBIN A1C: 5.9 % — AB (ref 4.8–5.6)

## 2013-08-28 LAB — COMPREHENSIVE METABOLIC PANEL
A/G RATIO: 1.2 (ref 1.1–2.5)
ALBUMIN: 3.6 g/dL (ref 3.5–5.5)
ALT: 19 IU/L (ref 0–44)
AST: 15 IU/L (ref 0–40)
Alkaline Phosphatase: 87 IU/L (ref 39–117)
BILIRUBIN TOTAL: 0.5 mg/dL (ref 0.0–1.2)
BUN/Creatinine Ratio: 4 — ABNORMAL LOW (ref 9–20)
BUN: 42 mg/dL — AB (ref 6–24)
CALCIUM: 8.6 mg/dL — AB (ref 8.7–10.2)
CO2: 25 mmol/L (ref 18–29)
Chloride: 97 mmol/L (ref 97–108)
Creatinine, Ser: 10.28 mg/dL — ABNORMAL HIGH (ref 0.76–1.27)
GFR, EST AFRICAN AMERICAN: 6 mL/min/{1.73_m2} — AB (ref >59)
GFR, EST NON AFRICAN AMERICAN: 5 mL/min/{1.73_m2} — AB (ref >59)
Globulin, Total: 3.1 g/dL (ref 1.5–4.5)
Glucose: 107 mg/dL — ABNORMAL HIGH (ref 65–99)
POTASSIUM: 4.6 mmol/L (ref 3.5–5.2)
Sodium: 139 mmol/L (ref 134–144)
TOTAL PROTEIN: 6.7 g/dL (ref 6.0–8.5)

## 2013-08-28 LAB — LIPID PANEL
Chol/HDL Ratio: 3.4 ratio units (ref 0.0–5.0)
Cholesterol, Total: 111 mg/dL (ref 100–199)
HDL: 33 mg/dL — AB (ref >39)
LDL CALC: 52 mg/dL (ref 0–99)
TRIGLYCERIDES: 130 mg/dL (ref 0–149)
VLDL Cholesterol Cal: 26 mg/dL (ref 5–40)

## 2013-08-31 DIAGNOSIS — D631 Anemia in chronic kidney disease: Secondary | ICD-10-CM | POA: Diagnosis not present

## 2013-08-31 DIAGNOSIS — N186 End stage renal disease: Secondary | ICD-10-CM | POA: Diagnosis not present

## 2013-08-31 DIAGNOSIS — D509 Iron deficiency anemia, unspecified: Secondary | ICD-10-CM | POA: Diagnosis not present

## 2013-08-31 DIAGNOSIS — N2581 Secondary hyperparathyroidism of renal origin: Secondary | ICD-10-CM | POA: Diagnosis not present

## 2013-08-31 DIAGNOSIS — Z23 Encounter for immunization: Secondary | ICD-10-CM | POA: Diagnosis not present

## 2013-09-01 ENCOUNTER — Encounter: Payer: Self-pay | Admitting: Internal Medicine

## 2013-09-01 ENCOUNTER — Ambulatory Visit (INDEPENDENT_AMBULATORY_CARE_PROVIDER_SITE_OTHER): Payer: Medicare Other | Admitting: Internal Medicine

## 2013-09-01 VITALS — BP 140/68 | HR 78 | Temp 101.0°F | Resp 12 | Ht 68.5 in | Wt 229.0 lb

## 2013-09-01 DIAGNOSIS — N039 Chronic nephritic syndrome with unspecified morphologic changes: Secondary | ICD-10-CM

## 2013-09-01 DIAGNOSIS — E785 Hyperlipidemia, unspecified: Secondary | ICD-10-CM

## 2013-09-01 DIAGNOSIS — N186 End stage renal disease: Secondary | ICD-10-CM

## 2013-09-01 DIAGNOSIS — I10 Essential (primary) hypertension: Secondary | ICD-10-CM

## 2013-09-01 DIAGNOSIS — E1129 Type 2 diabetes mellitus with other diabetic kidney complication: Secondary | ICD-10-CM | POA: Diagnosis not present

## 2013-09-01 DIAGNOSIS — R1031 Right lower quadrant pain: Secondary | ICD-10-CM | POA: Diagnosis not present

## 2013-09-01 DIAGNOSIS — R1903 Right lower quadrant abdominal swelling, mass and lump: Secondary | ICD-10-CM | POA: Diagnosis not present

## 2013-09-01 DIAGNOSIS — E1165 Type 2 diabetes mellitus with hyperglycemia: Secondary | ICD-10-CM

## 2013-09-01 DIAGNOSIS — R609 Edema, unspecified: Secondary | ICD-10-CM

## 2013-09-01 DIAGNOSIS — Z992 Dependence on renal dialysis: Secondary | ICD-10-CM

## 2013-09-01 DIAGNOSIS — D631 Anemia in chronic kidney disease: Secondary | ICD-10-CM

## 2013-09-01 NOTE — Progress Notes (Signed)
Patient ID: Kirk [name], male   DOB: 1960-06-07, 53 y.o.   MRN: [id number]    Location:    [facility]  Place of Service:  OFFICE    Allergies  Allergen Reactions  . Codeine Nausea And Vomiting and Nausea Only    Chief Complaint  Patient presents with  . Medical Management of Chronic Issues    4 month follow-up, discuss labs (copy printed)   . Abdominal Pain    Stomach pain x 3 days and fever of 101    HPI:  RLQ abdominal pain: On Aug 08/22/13 through 08/25/13. He thinks it is similar to pain he has had in the past. Has discussed with surgeons and with Dr. Arty Baumgartner. No cause has been found. Had CT abdin jan 2015 that did not disclose any abnormality:  "Unremarkable noncontrast CT in this patient with right lower quadrant transplant kidney. No CT signs of hydronephrosis or perinephric fluid collection."  RLQ abdominal mass: tender mass about 3" in diameter in the RLQ.  Type II or unspecified type diabetes mellitus with renal manifestations, uncontrolled(250.42): improved control  Essential hypertension: controlled  Edema; weight down 20# since April 2015. Prior edema has significantly improved.   Medications: Patient's Medications  New Prescriptions   No medications on file  Previous Medications   ACETAMINOPHEN (TYLENOL) 500 MG TABLET    Take by mouth.   ALBUTEROL-IPRATROPIUM (COMBIVENT) 18-103 MCG/ACT INHALER    Inhale 2 puffs into the lungs every 6 (six) hours as needed for wheezing.   AMLODIPINE (NORVASC) 10 MG TABLET    Take 10 mg by mouth daily.   ASPIRIN EC 81 MG TABLET    Take 81 mg by mouth daily.   B COMPLEX-C-FOLIC ACID (NEPHRO-VITE PO)    Take by mouth.   B COMPLEX-VITAMIN C-FOLIC ACID (NEPHRO-VITE) 0.8 MG TABS TABLET    TAKE 1 TABLET BY MOUTH DAILY.   CALCIUM ACETATE (PHOSLO) 667 MG CAPSULE    4 by mouth with each meal and 2 by mouth with snacks   CARVEDILOL (COREG) 25 MG TABLET    Take one tablet in the morning and one tablet in the evening   CHOLECALCIFEROL 5000  UNITS TABS    Take 5,000 Units by mouth every morning.   DIPHENHYDRAMINE (BENADRYL) 25 MG TABLET    Take 25 mg by mouth 2 (two) times daily as needed.   DOXAZOSIN (CARDURA) 4 MG TABLET    Take one tablet by mouth once daily to regulate blood pressure   HYDRALAZINE (APRESOLINE) 25 MG TABLET    Take 25 mg by mouth 3 (three) times daily. Take 3 tabs every 6 hours by mouth   ONDANSETRON (ZOFRAN) 4 MG TABLET    One every 6 hours if needed for nausea   PRAVASTATIN (PRAVACHOL) 40 MG TABLET    Take one tablet by mouth once daily   PREDNISONE (DELTASONE) 5 MG TABLET    Take 5 mg by mouth daily.   TACROLIMUS (PROGRAF) 1 MG CAPSULE    Take 4 mg by mouth 2 (two) times daily.    TRAMADOL (ULTRAM) 50 MG TABLET    Take one tablet every 6 hours as needed for pain  Modified Medications   No medications on file  Discontinued Medications   AMOXICILLIN (AMOXIL) 500 MG CAPSULE    Take 4 capsules (2,000 mg) 30 minutes before dental procedure   CIPROFLOXACIN (CIPRO) 500 MG TABLET    Take by mouth.   HYDROCODONE-ACETAMINOPHEN (NORCO/VICODIN) 5-325 MG PER TABLET  Review of Systems  Constitutional: Negative for fever.       Chronically fatigued.  Eyes: Negative.   Respiratory: Negative for shortness of breath.        Dyspnea on exertion.  Cardiovascular: Negative for chest pain, palpitations and leg swelling.  Gastrointestinal: Negative for abdominal pain and abdominal distention.  Endocrine:       Diabetes is controlled.  Genitourinary: Negative.        Renal failure and kidney transplant.  Musculoskeletal:       Neck discomfort. Chronic right knee discomfort. Chronic back pains. Unsteady gait.  Neurological:       Memory is worse. Feet feel numb.  Hematological:       Anemia of CKD.  Psychiatric/Behavioral: Positive for confusion and decreased concentration.       Insomnia. Falls asleep in the day.    Filed Vitals:   09/01/13 1545  BP: 140/68  Pulse: 78  Temp: 101 F (38.3 C)  TempSrc:  Oral  Resp: 12  Height: 5' 8.5" (1.74 m)  Weight: 229 lb (103.874 kg)  SpO2: 96%   Body mass index is 34.31 kg/(m^2).  Physical Exam  Constitutional: He is oriented to person, place, and time.  Obese.  HENT:  Loss of hearing.  Eyes:  Corrective lenses  Neck: No JVD present. No tracheal deviation present. No thyromegaly present.  Cardiovascular: Exam reveals no gallop.   No murmur heard. AF. 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  Abdominal: Soft. Bowel sounds are normal. He exhibits mass (3" in RLQ under the scar of his kidney transplant). He exhibits no distension. There is tenderness (RLQ  under the kidney transplant scar.).  Musculoskeletal: Normal range of motion. He exhibits tenderness. He exhibits no edema.  Scar left knee. Tender right knee  Lymphadenopathy:    He has no cervical adenopathy.  Neurological: He is alert and oriented to person, place, and time. No cranial nerve deficit. Coordination normal.  Positive Tinel sign bilaterally  Skin: No rash noted. No erythema. No pallor.  Psychiatric:  Slow. Depressed acting.     Labs reviewed: Appointment on 08/27/2013  Component Date Value Ref Range Status  . Glucose 08/27/2013 107* 65 - 99 mg/dL Final  . BUN 08/27/2013 42* 6 - 24 mg/dL Final  . Creatinine, Ser 08/27/2013 10.28* 0.76 - 1.27 mg/dL Final   **Verified by repeat analysis**  . GFR calc non Af Amer 08/27/2013 5* >59 mL/min/1.73 Final  . GFR calc Af Amer 08/27/2013 6* >59 mL/min/1.73 Final  . BUN/Creatinine Ratio 08/27/2013 4* 9 - 20 Final  . Sodium 08/27/2013 139  134 - 144 mmol/L Final  . Potassium 08/27/2013 4.6  3.5 - 5.2 mmol/L Final  . Chloride 08/27/2013 97  97 - 108 mmol/L Final  . CO2 08/27/2013 25  18 - 29 mmol/L Final  . Calcium 08/27/2013 8.6* 8.7 - 10.2 mg/dL Final  . Total Protein 08/27/2013 6.7  6.0 - 8.5 g/dL Final  . Albumin 08/27/2013 3.6  3.5 - 5.5 g/dL Final  . Globulin, Total 08/27/2013 3.1  1.5 - 4.5 g/dL Final  . Albumin/Globulin Ratio  08/27/2013 1.2  1.1 - 2.5 Final  . Total Bilirubin 08/27/2013 0.5  0.0 - 1.2 mg/dL Final  . Alkaline Phosphatase 08/27/2013 87  39 - 117 IU/L Final  . AST 08/27/2013 15  0 - 40 IU/L Final  . ALT 08/27/2013 19  0 - 44 IU/L Final  . Hemoglobin A1C 08/27/2013 5.9* 4.8 - 5.6 % Final  Comment:          Increased risk for diabetes: 5.7 - 6.4                                   Diabetes: >6.4                                   Glycemic control for adults with diabetes: <7.0  . Estimated average glucose 08/27/2013 123   Final  . Cholesterol, Total 08/27/2013 111  100 - 199 mg/dL Final  . Triglycerides 08/27/2013 130  0 - 149 mg/dL Final  . HDL 08/27/2013 33* >39 mg/dL Final   Comment: According to ATP-III Guidelines, HDL-C >59 mg/dL is considered a                          negative risk factor for CHD.  Marland Kitchen VLDL Cholesterol Cal 08/27/2013 26  5 - 40 mg/dL Final  . LDL Calculated 08/27/2013 52  0 - 99 mg/dL Final  . Chol/HDL Ratio 08/27/2013 3.4  0.0 - 5.0 ratio units Final   Comment:                                   T. Chol/HDL Ratio                                                                      Men  Women                                                        1/2 Avg.Risk  3.4    3.3                                                            Avg.Risk  5.0    4.4                                                         2X Avg.Risk  9.6    7.1                                                         3X Avg.Risk 23.4   11.0      Assessment/Plan  1. RLQ abdominal pain - MR Abdomen Wo Contrast; Future  2. RLQ abdominal mass - MR Abdomen Wo Contrast; Future  3. Type II or unspecified type diabetes mellitus with renal manifestations, uncontrolled(250.42) improved - Hemoglobin A1c; Future - Basic metabolic panel; Future  4. Essential hypertension controlled  5. 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) - CBC With differential/Platelet; Future  6. Hyperlipidemia improved  7.  Edema improved  8. End-stage renal disease on hemodialysis Continue HD

## 2013-09-02 ENCOUNTER — Telehealth: Payer: Self-pay | Admitting: *Deleted

## 2013-09-02 DIAGNOSIS — Z23 Encounter for immunization: Secondary | ICD-10-CM | POA: Diagnosis not present

## 2013-09-02 DIAGNOSIS — D509 Iron deficiency anemia, unspecified: Secondary | ICD-10-CM | POA: Diagnosis not present

## 2013-09-02 DIAGNOSIS — N186 End stage renal disease: Secondary | ICD-10-CM | POA: Diagnosis not present

## 2013-09-02 DIAGNOSIS — N039 Chronic nephritic syndrome with unspecified morphologic changes: Secondary | ICD-10-CM | POA: Diagnosis not present

## 2013-09-02 DIAGNOSIS — D631 Anemia in chronic kidney disease: Secondary | ICD-10-CM | POA: Diagnosis not present

## 2013-09-02 DIAGNOSIS — N2581 Secondary hyperparathyroidism of renal origin: Secondary | ICD-10-CM | POA: Diagnosis not present

## 2013-09-02 NOTE — Telephone Encounter (Signed)
Patient wife, Kirk [name], called and stated that Dr. Marval Regal did not order a Ultrasound or CT and wanted to know if you will place an order for it instead so they can find out what is going on. Wife stated that she wants a Tuesday or Thursday. Please Advise.

## 2013-09-02 NOTE — Telephone Encounter (Signed)
i ordered an MRI of the abd yesterday. I think this is the preferred test.

## 2013-09-03 ENCOUNTER — Other Ambulatory Visit: Payer: Self-pay | Admitting: *Deleted

## 2013-09-03 DIAGNOSIS — R1031 Right lower quadrant pain: Secondary | ICD-10-CM

## 2013-09-03 DIAGNOSIS — R1903 Right lower quadrant abdominal swelling, mass and lump: Secondary | ICD-10-CM

## 2013-09-03 NOTE — Telephone Encounter (Signed)
Patient wife notified that Dr. Nyoka Cowden placed order and they should be hearing from patient care coordinator soon regarding appointment. Agreed.

## 2013-09-04 DIAGNOSIS — N039 Chronic nephritic syndrome with unspecified morphologic changes: Secondary | ICD-10-CM | POA: Diagnosis not present

## 2013-09-04 DIAGNOSIS — N2581 Secondary hyperparathyroidism of renal origin: Secondary | ICD-10-CM | POA: Diagnosis not present

## 2013-09-04 DIAGNOSIS — D509 Iron deficiency anemia, unspecified: Secondary | ICD-10-CM | POA: Diagnosis not present

## 2013-09-04 DIAGNOSIS — N186 End stage renal disease: Secondary | ICD-10-CM | POA: Diagnosis not present

## 2013-09-04 DIAGNOSIS — D631 Anemia in chronic kidney disease: Secondary | ICD-10-CM | POA: Diagnosis not present

## 2013-09-04 DIAGNOSIS — Z23 Encounter for immunization: Secondary | ICD-10-CM | POA: Diagnosis not present

## 2013-09-07 ENCOUNTER — Other Ambulatory Visit: Payer: BC Managed Care – PPO

## 2013-09-07 ENCOUNTER — Ambulatory Visit
Admission: RE | Admit: 2013-09-07 | Discharge: 2013-09-07 | Disposition: A | Discharge status: Home / Self Care | Payer: Medicare Other | Source: Ambulatory Visit | Attending: Internal Medicine | Admitting: Internal Medicine

## 2013-09-07 DIAGNOSIS — R1903 Right lower quadrant abdominal swelling, mass and lump: Secondary | ICD-10-CM

## 2013-09-07 DIAGNOSIS — N2581 Secondary hyperparathyroidism of renal origin: Secondary | ICD-10-CM | POA: Diagnosis not present

## 2013-09-07 DIAGNOSIS — D509 Iron deficiency anemia, unspecified: Secondary | ICD-10-CM | POA: Diagnosis not present

## 2013-09-07 DIAGNOSIS — N039 Chronic nephritic syndrome with unspecified morphologic changes: Secondary | ICD-10-CM | POA: Diagnosis not present

## 2013-09-07 DIAGNOSIS — R1031 Right lower quadrant pain: Secondary | ICD-10-CM

## 2013-09-07 DIAGNOSIS — N186 End stage renal disease: Secondary | ICD-10-CM | POA: Diagnosis not present

## 2013-09-07 DIAGNOSIS — Z23 Encounter for immunization: Secondary | ICD-10-CM | POA: Diagnosis not present

## 2013-09-07 DIAGNOSIS — Z029 Encounter for administrative examinations, unspecified: Secondary | ICD-10-CM

## 2013-09-07 DIAGNOSIS — D631 Anemia in chronic kidney disease: Secondary | ICD-10-CM | POA: Diagnosis not present

## 2013-09-08 ENCOUNTER — Emergency Department (HOSPITAL_COMMUNITY): Payer: Medicare Other

## 2013-09-08 ENCOUNTER — Emergency Department (HOSPITAL_COMMUNITY)
Admission: EM | Admit: 2013-09-08 | Discharge: 2013-09-08 | Disposition: A | Discharge status: Home / Self Care | Payer: Medicare Other | Attending: Emergency Medicine | Admitting: Emergency Medicine

## 2013-09-08 ENCOUNTER — Encounter (HOSPITAL_COMMUNITY): Payer: Self-pay | Admitting: Emergency Medicine

## 2013-09-08 DIAGNOSIS — R319 Hematuria, unspecified: Secondary | ICD-10-CM | POA: Insufficient documentation

## 2013-09-08 DIAGNOSIS — N186 End stage renal disease: Secondary | ICD-10-CM | POA: Diagnosis not present

## 2013-09-08 DIAGNOSIS — I129 Hypertensive chronic kidney disease with stage 1 through stage 4 chronic kidney disease, or unspecified chronic kidney disease: Secondary | ICD-10-CM | POA: Diagnosis not present

## 2013-09-08 DIAGNOSIS — Z862 Personal history of diseases of the blood and blood-forming organs and certain disorders involving the immune mechanism: Secondary | ICD-10-CM | POA: Insufficient documentation

## 2013-09-08 DIAGNOSIS — G4730 Sleep apnea, unspecified: Secondary | ICD-10-CM | POA: Insufficient documentation

## 2013-09-08 DIAGNOSIS — N200 Calculus of kidney: Secondary | ICD-10-CM | POA: Diagnosis not present

## 2013-09-08 DIAGNOSIS — Z872 Personal history of diseases of the skin and subcutaneous tissue: Secondary | ICD-10-CM | POA: Insufficient documentation

## 2013-09-08 DIAGNOSIS — R143 Flatulence: Secondary | ICD-10-CM | POA: Diagnosis not present

## 2013-09-08 DIAGNOSIS — R1031 Right lower quadrant pain: Secondary | ICD-10-CM | POA: Insufficient documentation

## 2013-09-08 DIAGNOSIS — F411 Generalized anxiety disorder: Secondary | ICD-10-CM | POA: Diagnosis not present

## 2013-09-08 DIAGNOSIS — Z8619 Personal history of other infectious and parasitic diseases: Secondary | ICD-10-CM | POA: Insufficient documentation

## 2013-09-08 DIAGNOSIS — Z9981 Dependence on supplemental oxygen: Secondary | ICD-10-CM | POA: Insufficient documentation

## 2013-09-08 DIAGNOSIS — Z8719 Personal history of other diseases of the digestive system: Secondary | ICD-10-CM | POA: Diagnosis not present

## 2013-09-08 DIAGNOSIS — Z87891 Personal history of nicotine dependence: Secondary | ICD-10-CM | POA: Insufficient documentation

## 2013-09-08 DIAGNOSIS — N390 Urinary tract infection, site not specified: Secondary | ICD-10-CM | POA: Diagnosis not present

## 2013-09-08 DIAGNOSIS — E1165 Type 2 diabetes mellitus with hyperglycemia: Secondary | ICD-10-CM | POA: Insufficient documentation

## 2013-09-08 DIAGNOSIS — E1129 Type 2 diabetes mellitus with other diabetic kidney complication: Secondary | ICD-10-CM | POA: Diagnosis not present

## 2013-09-08 DIAGNOSIS — Z992 Dependence on renal dialysis: Secondary | ICD-10-CM | POA: Insufficient documentation

## 2013-09-08 DIAGNOSIS — E785 Hyperlipidemia, unspecified: Secondary | ICD-10-CM | POA: Insufficient documentation

## 2013-09-08 DIAGNOSIS — Z79899 Other long term (current) drug therapy: Secondary | ICD-10-CM | POA: Diagnosis not present

## 2013-09-08 DIAGNOSIS — Z940 Kidney transplant status: Secondary | ICD-10-CM | POA: Insufficient documentation

## 2013-09-08 DIAGNOSIS — R141 Gas pain: Secondary | ICD-10-CM | POA: Diagnosis not present

## 2013-09-08 DIAGNOSIS — Z9852 Vasectomy status: Secondary | ICD-10-CM | POA: Diagnosis not present

## 2013-09-08 DIAGNOSIS — Z8669 Personal history of other diseases of the nervous system and sense organs: Secondary | ICD-10-CM | POA: Insufficient documentation

## 2013-09-08 DIAGNOSIS — K8020 Calculus of gallbladder without cholecystitis without obstruction: Secondary | ICD-10-CM | POA: Diagnosis not present

## 2013-09-08 DIAGNOSIS — I10 Essential (primary) hypertension: Secondary | ICD-10-CM | POA: Diagnosis not present

## 2013-09-08 DIAGNOSIS — Z7982 Long term (current) use of aspirin: Secondary | ICD-10-CM | POA: Diagnosis not present

## 2013-09-08 LAB — COMPREHENSIVE METABOLIC PANEL
ALBUMIN: 3 g/dL — AB (ref 3.5–5.2)
ALK PHOS: 135 U/L — AB (ref 39–117)
ALT: 37 U/L (ref 0–53)
ANION GAP: 19 — AB (ref 5–15)
AST: 26 U/L (ref 0–37)
BUN: 43 mg/dL — ABNORMAL HIGH (ref 6–23)
CALCIUM: 9 mg/dL (ref 8.4–10.5)
CO2: 22 mEq/L (ref 19–32)
Chloride: 95 mEq/L — ABNORMAL LOW (ref 96–112)
Creatinine, Ser: 10.18 mg/dL — ABNORMAL HIGH (ref 0.50–1.35)
GFR calc Af Amer: 6 mL/min — ABNORMAL LOW (ref >90)
GFR calc non Af Amer: 5 mL/min — ABNORMAL LOW (ref >90)
Glucose, Bld: 141 mg/dL — ABNORMAL HIGH (ref 70–99)
Potassium: 4.9 mEq/L (ref 3.7–5.3)
SODIUM: 136 meq/L — AB (ref 137–147)
TOTAL PROTEIN: 7.8 g/dL (ref 6.0–8.3)
Total Bilirubin: 0.3 mg/dL (ref 0.3–1.2)

## 2013-09-08 LAB — I-STAT CHEM 8, ED
BUN: 40 mg/dL — AB (ref 6–23)
CALCIUM ION: 1.02 mmol/L — AB (ref 1.12–1.23)
CHLORIDE: 100 meq/L (ref 96–112)
Creatinine, Ser: 11.9 mg/dL — ABNORMAL HIGH (ref 0.50–1.35)
Glucose, Bld: 144 mg/dL — ABNORMAL HIGH (ref 70–99)
HEMATOCRIT: 27 % — AB (ref 39.0–52.0)
Hemoglobin: 9.2 g/dL — ABNORMAL LOW (ref 13.0–17.0)
POTASSIUM: 4.7 meq/L (ref 3.7–5.3)
Sodium: 134 mEq/L — ABNORMAL LOW (ref 137–147)
TCO2: 24 mmol/L (ref 0–100)

## 2013-09-08 LAB — URINALYSIS, ROUTINE W REFLEX MICROSCOPIC
Glucose, UA: NEGATIVE mg/dL
KETONES UR: 15 mg/dL — AB
NITRITE: NEGATIVE
Specific Gravity, Urine: 1.02 (ref 1.005–1.030)
UROBILINOGEN UA: 0.2 mg/dL (ref 0.0–1.0)
pH: 6.5 (ref 5.0–8.0)

## 2013-09-08 LAB — URINE MICROSCOPIC-ADD ON

## 2013-09-08 LAB — CBC
HCT: 27.9 % — ABNORMAL LOW (ref 39.0–52.0)
Hemoglobin: 8.8 g/dL — ABNORMAL LOW (ref 13.0–17.0)
MCH: 30.4 pg (ref 26.0–34.0)
MCHC: 31.5 g/dL (ref 30.0–36.0)
MCV: 96.5 fL (ref 78.0–100.0)
Platelets: 143 10*3/uL — ABNORMAL LOW (ref 150–400)
RBC: 2.89 MIL/uL — ABNORMAL LOW (ref 4.22–5.81)
RDW: 14.4 % (ref 11.5–15.5)
WBC: 5.5 10*3/uL (ref 4.0–10.5)

## 2013-09-08 MED ORDER — CEPHALEXIN 250 MG PO CAPS: 250.0000 mg | ORAL_CAPSULE | Freq: Two times a day (BID) | ORAL | Status: DC

## 2013-09-08 MED ORDER — IOHEXOL 300 MG/ML  SOLN
25.0000 mL | INTRAMUSCULAR | Status: AC
  Administered 2013-09-08: 25 mL via ORAL

## 2013-09-08 MED ORDER — SODIUM CHLORIDE 0.9 % IV BOLUS (SEPSIS)
500.0000 mL | Freq: Once | INTRAVENOUS | Status: AC
  Administered 2013-09-08: 500 mL via INTRAVENOUS

## 2013-09-08 MED ORDER — DEXTROSE 5 % IV SOLN
1.0000 g | Freq: Once | INTRAVENOUS | Status: AC
  Administered 2013-09-08: 1 g via INTRAVENOUS
  Filled 2013-09-08: qty 10

## 2013-09-08 NOTE — Discharge Instructions (Signed)
Take keflex for a week.   Follow up with your urologist.   Take tramadol or tylenol for pain.   Return to ER if you have fever, worsening abdominal pain.

## 2013-09-08 NOTE — ED Notes (Signed)
Dr. Yao at bedside. 

## 2013-09-08 NOTE — ED Notes (Signed)
Pt c/o hematuria and pain where old kidney is on right side x 2 days; pt sts hx of renal transplant that failed and pt on dialysis again x 3 months

## 2013-09-08 NOTE — ED Notes (Signed)
CT notified pt has finished drinking contrast

## 2013-09-08 NOTE — ED Provider Notes (Signed)
CSN: [id number]     Arrival date & time 09/08/13  1639 History   First MD Initiated Contact with Patient 09/08/13 1941     Chief Complaint  Patient presents with  . Hematuria     (Consider location/radiation/quality/duration/timing/severity/associated sxs/prior Treatment) The history is provided by the patient.  Kirk [name] is a 53 y.o. male hx of CKD s/p transplant that failed and now on HD (last HD was yesterday), HTN, DM here with abdominal pain. Intermittent abdominal pain around the renal transplant site for the last few weeks. Had MRI yesterday that didn't show any obvious cause for the pain. Since this morning, he has been having worsening abdominal distention. Also increased pain around the site. No vomiting, still passing gas. Also developed hematuria for the last days, mild dysuria as well.     Past Medical History  Diagnosis Date  . Anxiety   . Chronic headaches   . Chronic kidney disease (CKD)   . Dialysis care 02/06/2010  . HTN (hypertension)   . Sleep apnea     wears the CPAP  . Type II or unspecified type diabetes mellitus with renal manifestations, uncontrolled   . Shortness of breath   . Impotence of organic origin   . Other malaise and fatigue   . Debility, unspecified   . Cellulitis and abscess of trunk   . Kidney replaced by transplant   . Acute edema of lung, unspecified   . Unspecified constipation   . Other chronic nonalcoholic liver disease   . Localization-related (focal) (partial) epilepsy and epileptic syndromes with complex partial seizures, without mention of intractable epilepsy   . Tension headache   . Dermatophytosis of the body   . Gout, unspecified   . Pain in joint, upper arm   . Pain in joint, lower leg   . Other nonspecific abnormal serum enzyme levels   . Essential hypertension, benign   . Acute, but ill-defined, cerebrovascular disease   . Memory loss   . Hypotension, unspecified   . Hypertrophy of prostate without urinary  obstruction and other lower urinary tract symptoms (LUTS)   . Renal dialysis status(V45.11)   . End stage renal disease   . Insomnia, unspecified   . Secondary hyperparathyroidism (of renal origin)   . Lumbago   . Unspecified vitamin D deficiency   . Carpal tunnel syndrome   . 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)   . Unspecified hereditary and idiopathic peripheral neuropathy   . Other and unspecified hyperlipidemia   . Unspecified essential hypertension   . Arteriovenous fistula, acquired   . Edema   . Vasectomy status   . Obstructive sleep apnea (adult) (pediatric)    Past Surgical History  Procedure Laterality Date  . 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 Colonoscopy    . Dialysis shunt placement    . Kidney transplant  08/17/2011    [facility]   . Placement of fistula at [facility] vein specialist     Family History  Problem Relation Age of Onset  . Colon cancer Neg Hx   . Esophageal cancer Neg Hx   . Rectal cancer Neg Hx   . Stomach cancer Neg Hx    History  Substance Use Topics  . Smoking status: Former Smoker -- 28 years    Types: Cigarettes    Quit date: 08/04/2011  . Smokeless tobacco: Never Used     Comment: 1/2 pack every 4 days  .  Alcohol Use: No    Review of Systems  Gastrointestinal: Positive for abdominal distention.  Genitourinary: Positive for hematuria.  All other systems reviewed and are negative.     Allergies  Codeine  Home Medications   Prior to Admission medications   Medication Sig Start Date End Date Taking? Authorizing Provider  acetaminophen (TYLENOL) 500 MG tablet Take 500 mg by mouth every 4 (four) hours as needed for mild pain.    Yes Historical Provider, MD  albuterol-ipratropium (COMBIVENT) 18-103 MCG/ACT inhaler Inhale 2 puffs into the lungs every 6 (six) hours as needed for wheezing. 07/08/12  Yes Pricilla Larsson, NP  amLODipine (NORVASC) 10 MG tablet Take 10 mg by mouth daily.   Yes Historical Provider,  MD  aspirin EC 81 MG tablet Take 81 mg by mouth daily.   Yes Historical Provider, MD  B Complex-C-Folic Acid (NEPHRO-VITE PO) Take by mouth.   Yes Historical Provider, MD  calcium acetate (PHOSLO) 667 MG capsule Take 667 mg by mouth See admin instructions. 4 by mouth with each meal and 2 by mouth with snacks 04/28/13  Yes Estill Dooms, MD  carvedilol (COREG) 25 MG tablet Take 25 mg by mouth 2 (two) times daily. Take one tablet in the morning and one tablet in the evening 02/16/13  Yes Mahima Pandey, MD  Cholecalciferol 5000 UNITS TABS Take 5,000 Units by mouth every morning.   Yes Historical Provider, MD  diphenhydrAMINE (BENADRYL) 25 MG tablet Take 25 mg by mouth 2 (two) times daily.    Yes Historical Provider, MD  doxazosin (CARDURA) 4 MG tablet Take 2 mg by mouth daily. Take one tablet by mouth once daily to regulate blood pressure 03/23/13  Yes Estill Dooms, MD  hydrALAZINE (APRESOLINE) 25 MG tablet Take 25 mg by mouth 3 (three) times daily.    Yes Historical Provider, MD  ondansetron (ZOFRAN) 4 MG tablet Take 4 mg by mouth every 8 (eight) hours as needed for nausea. 07/23/13  Yes Estill Dooms, MD  pravastatin (PRAVACHOL) 40 MG tablet Take 40 mg by mouth every evening. 02/16/13  Yes Mahima Pandey, MD  predniSONE (DELTASONE) 5 MG tablet Take 5 mg by mouth daily.   Yes Historical Provider, MD  tacrolimus (PROGRAF) 1 MG capsule Take 4 mg by mouth 2 (two) times daily.    Yes Historical Provider, MD  traMADol (ULTRAM) 50 MG tablet Take 50 mg by mouth 2 (two) times daily as needed. 04/28/13  Yes Estill Dooms, MD   BP 154/72  Pulse 75  Temp(Src) 98 F (36.7 C) (Oral)  Resp 18  SpO2 97% Physical Exam  Nursing note and vitals reviewed. Constitutional: He is oriented to person, place, and time. He appears well-developed and well-nourished.  HENT:  Head: Normocephalic.  Mouth/Throat: Oropharynx is clear and moist.  Eyes: Conjunctivae are normal. Pupils are equal, round, and reactive to light.   Neck: Normal range of motion. Neck supple.  Cardiovascular: Normal rate, regular rhythm and normal heart sounds.   Pulmonary/Chest: Effort normal and breath sounds normal. No respiratory distress. He has no wheezes. He has no rales.  Abdominal:  Distended, mild tenderness over the renal transplant site on RLQ. No rebound.   Neurological: He is alert and oriented to person, place, and time. No cranial nerve deficit. Coordination normal.  Skin: Skin is warm and dry.  Psychiatric: He has a normal mood and affect. His behavior is normal. Judgment and thought content normal.    ED Course  Procedures (including critical care time) Labs Review Labs Reviewed  URINALYSIS, ROUTINE W REFLEX MICROSCOPIC - Abnormal; Notable for the following:    Color, Urine RED (*)    APPearance CLOUDY (*)    Hgb urine dipstick LARGE (*)    Bilirubin Urine SMALL (*)    Ketones, ur 15 (*)    Protein, ur >300 (*)    Leukocytes, UA SMALL (*)    All other components within normal limits  CBC - Abnormal; Notable for the following:    RBC 2.89 (*)    Hemoglobin 8.8 (*)    HCT 27.9 (*)    Platelets 143 (*)    All other components within normal limits  COMPREHENSIVE METABOLIC PANEL - Abnormal; Notable for the following:    Sodium 136 (*)    Chloride 95 (*)    Glucose, Bld 141 (*)    BUN 43 (*)    Creatinine, Ser 10.18 (*)    Albumin 3.0 (*)    Alkaline Phosphatase 135 (*)    GFR calc non Af Amer 5 (*)    GFR calc Af Amer 6 (*)    Anion gap 19 (*)    All other components within normal limits  URINE MICROSCOPIC-ADD ON - Abnormal; Notable for the following:    Squamous Epithelial / LPF FEW (*)    Bacteria, UA FEW (*)    All other components within normal limits  I-STAT CHEM 8, ED - Abnormal; Notable for the following:    Sodium 134 (*)    BUN 40 (*)    Creatinine, Ser 11.90 (*)    Glucose, Bld 144 (*)    Calcium, Ion 1.02 (*)    Hemoglobin 9.2 (*)    HCT 27.0 (*)    All other components within normal  limits    Imaging Review Ct Abdomen Pelvis Wo Contrast  09/08/2013   CLINICAL DATA:  Hematuria and flank pain.  EXAM: CT ABDOMEN AND PELVIS WITHOUT CONTRAST  TECHNIQUE: Multidetector CT imaging of the abdomen and pelvis was performed following the standard protocol without IV contrast.  COMPARISON:  01/31/2012.  FINDINGS: The lung bases are clear.  No acute pulmonary findings.  The unenhanced appearance of the liver is unremarkable. No focal hepatic lesions or intrahepatic biliary dilatation. Gallstones are noted in the gallbladder but no findings for acute inflammation. The common bile duct is normal in caliber. The pancreas is unremarkable. The spleen is mildly enlarged measuring 13.5 x 10.0 x 12.0 cm. No focal lesions. The adrenal glands and negative kidneys are stable. There are bilateral renal calculi but no hydronephrosis. There is a transplant kidney in the right upper pelvis which appears stable. No renal or obstructing ureteral calculi.  The stomach, duodenum, small bowel and colon are unremarkable. No inflammatory changes, mass lesions or obstructive findings. The appendix is normal. No mesenteric or retroperitoneal mass or adenopathy. Stable aortic calcifications  The bladder, prostate gland and seminal vesicles are unremarkable. Mild bladder wall thickening could suggest cystitis. No pelvic mass or lymphadenopathy. No inguinal mass or adenopathy. Stable borderline inguinal lymph nodes.  The bony structures are unremarkable.  IMPRESSION: 1. Bilateral renal calculi in the native kidneys. 2. Stable CT appearance of the transplanted right pelvic kidney. No obstructive findings. 3. Mild stable splenomegaly. 4. Cholelithiasis. 5. Mild bladder wall thickening may suggest cystitis.   Electronically Signed   By: Kalman Jewels M.D.   On: 09/08/2013 22:49   Mr Abdomen Wo Contrast  09/08/2013   CLINICAL  DATA:  Right lower quadrant abdominal pain for 3 weeks. History of renal transplant in 2013.  EXAM: MRI  ABDOMEN WITHOUT CONTRAST  TECHNIQUE: Multiplanar multisequence MR imaging was performed without the administration of intravenous contrast.  COMPARISON:  Abdominal pelvic CT 01/31/2012.  FINDINGS: A vitamin-E capsule was placed over the area of concern in the right lower quadrant. This lies inferior to the incision for the right renal transplant. The anterior abdominal wall is thinned in this region, but stable. There is no focal hernia. There is no evidence of abdominal wall mass or fluid collection.  The renal transplant has a stable appearance. There is no hydronephrosis or perinephric fluid collection. The native kidneys are grossly stable with atrophy, small cysts and pelvicaliectasis.  The liver and spleen demonstrate diffusely decreased T2 signal and loss of signal on the gradient echo in phase images consistent with iron deposition. No focal lesions are observed. The pancreas and adrenal glands appear normal. There are small gallstones. No gallbladder wall thickening or biliary dilatation is present.  There is no adenopathy or ascites within the abdomen.  IMPRESSION: 1. No explanation for the patient's symptoms identified. Thinning of the anterior abdominal wall in the right lower quadrant near the renal transplant incision is stable without focal hernia. 2. The renal transplant in the right iliac fossa and the native kidneys are grossly stable. 3. 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   Electronically Signed   By: Camie Patience M.D.   On: 09/08/2013 08:21   Dg Abd Acute W/chest  09/08/2013   CLINICAL DATA:  Right abdominal pain. Hematuria. Abdominal distention. Dialysis.  EXAM: ACUTE ABDOMEN SERIES (ABDOMEN 2 VIEW & CHEST 1 VIEW)  COMPARISON:  09/07/2013  FINDINGS: Stable sclerosis the left first rib end. Lower cervical plate and screw fixator.  Cardiothoracic index 51% compatible with mild cardiomegaly. No edema. No pleural effusion. The lungs appear  otherwise clear.  Lower thoracic spondylosis. There is dilute contrast medium in the colon. Several borderline dilated loops of small bowel are present in the left abdomen. No significant abnormal air- fluid levels. No extraluminal gas.  IMPRESSION: 1. Dilute contrast medium in the colon ; referring to any ED note from today, the patient apparently has a CT scan with oral contrast scheduled. 2. Mild cardiomegaly. 3. There several borderline dilated loops of small bowel in the left abdomen, but without significant abnormal air- fluid levels. This could be a manifestation of mild ileus but is essentially nonspecific.   Electronically Signed   By: Sherryl Barters M.D.   On: 09/08/2013 22:02     EKG Interpretation None      MDM   Final diagnoses:  None    MELANIE HUME is a 53 y.o. male here with ab distention. Consider SBO. Hematuria can be UTI vs rejection of transplant. Will get UA, labs, CT ab/pel.   11:11 PM CT unremarkable. UA + UTI. Given ceftriaxone. Not septic appearing. Will d/c home with keflex.    Wandra Arthurs, MD 09/08/13 [phone]

## 2013-09-08 NOTE — ED Notes (Signed)
Patient transported to CT 

## 2013-09-09 DIAGNOSIS — N2581 Secondary hyperparathyroidism of renal origin: Secondary | ICD-10-CM | POA: Diagnosis not present

## 2013-09-09 DIAGNOSIS — N186 End stage renal disease: Secondary | ICD-10-CM | POA: Diagnosis not present

## 2013-09-09 DIAGNOSIS — Z23 Encounter for immunization: Secondary | ICD-10-CM | POA: Diagnosis not present

## 2013-09-09 DIAGNOSIS — D631 Anemia in chronic kidney disease: Secondary | ICD-10-CM | POA: Diagnosis not present

## 2013-09-09 DIAGNOSIS — D509 Iron deficiency anemia, unspecified: Secondary | ICD-10-CM | POA: Diagnosis not present

## 2013-09-09 DIAGNOSIS — N039 Chronic nephritic syndrome with unspecified morphologic changes: Secondary | ICD-10-CM | POA: Diagnosis not present

## 2013-09-10 ENCOUNTER — Other Ambulatory Visit: Payer: Self-pay | Admitting: Internal Medicine

## 2013-09-10 ENCOUNTER — Other Ambulatory Visit: Payer: BC Managed Care – PPO

## 2013-09-10 DIAGNOSIS — R1900 Intra-abdominal and pelvic swelling, mass and lump, unspecified site: Secondary | ICD-10-CM

## 2013-09-11 ENCOUNTER — Encounter: Payer: Self-pay | Admitting: *Deleted

## 2013-09-11 ENCOUNTER — Telehealth: Payer: Self-pay | Admitting: *Deleted

## 2013-09-11 DIAGNOSIS — Z23 Encounter for immunization: Secondary | ICD-10-CM | POA: Diagnosis not present

## 2013-09-11 DIAGNOSIS — N186 End stage renal disease: Secondary | ICD-10-CM | POA: Diagnosis not present

## 2013-09-11 DIAGNOSIS — D509 Iron deficiency anemia, unspecified: Secondary | ICD-10-CM | POA: Diagnosis not present

## 2013-09-11 DIAGNOSIS — N2581 Secondary hyperparathyroidism of renal origin: Secondary | ICD-10-CM | POA: Diagnosis not present

## 2013-09-11 DIAGNOSIS — D631 Anemia in chronic kidney disease: Secondary | ICD-10-CM | POA: Diagnosis not present

## 2013-09-11 NOTE — Telephone Encounter (Signed)
FYI:  per pt's wife, Mechele Claude;  Dr Florene Glen is suggesting removal of his transplanted Kidney due to he thinks that is causing most of his issues.  also has an appt  on 8/8 to have his gallbladder checked out for gallstones.

## 2013-09-11 NOTE — Telephone Encounter (Signed)
noted 

## 2013-09-14 DIAGNOSIS — N186 End stage renal disease: Secondary | ICD-10-CM | POA: Diagnosis not present

## 2013-09-14 DIAGNOSIS — R0609 Other forms of dyspnea: Secondary | ICD-10-CM | POA: Diagnosis not present

## 2013-09-14 DIAGNOSIS — Z79899 Other long term (current) drug therapy: Secondary | ICD-10-CM | POA: Diagnosis not present

## 2013-09-14 DIAGNOSIS — E7800 Pure hypercholesterolemia, unspecified: Secondary | ICD-10-CM | POA: Diagnosis not present

## 2013-09-14 DIAGNOSIS — R112 Nausea with vomiting, unspecified: Secondary | ICD-10-CM | POA: Diagnosis not present

## 2013-09-14 DIAGNOSIS — G4733 Obstructive sleep apnea (adult) (pediatric): Secondary | ICD-10-CM | POA: Diagnosis present

## 2013-09-14 DIAGNOSIS — Y830 Surgical operation with transplant of whole organ as the cause of abnormal reaction of the patient, or of later complication, without mention of misadventure at the time of the procedure: Secondary | ICD-10-CM | POA: Diagnosis not present

## 2013-09-14 DIAGNOSIS — N2581 Secondary hyperparathyroidism of renal origin: Secondary | ICD-10-CM | POA: Diagnosis not present

## 2013-09-14 DIAGNOSIS — N390 Urinary tract infection, site not specified: Secondary | ICD-10-CM | POA: Diagnosis not present

## 2013-09-14 DIAGNOSIS — Z8673 Personal history of transient ischemic attack (TIA), and cerebral infarction without residual deficits: Secondary | ICD-10-CM | POA: Diagnosis not present

## 2013-09-14 DIAGNOSIS — R509 Fever, unspecified: Secondary | ICD-10-CM | POA: Diagnosis not present

## 2013-09-14 DIAGNOSIS — Z298 Encounter for other specified prophylactic measures: Secondary | ICD-10-CM | POA: Diagnosis not present

## 2013-09-14 DIAGNOSIS — D509 Iron deficiency anemia, unspecified: Secondary | ICD-10-CM | POA: Diagnosis not present

## 2013-09-14 DIAGNOSIS — I120 Hypertensive chronic kidney disease with stage 5 chronic kidney disease or end stage renal disease: Secondary | ICD-10-CM | POA: Diagnosis not present

## 2013-09-14 DIAGNOSIS — Z23 Encounter for immunization: Secondary | ICD-10-CM | POA: Diagnosis not present

## 2013-09-14 DIAGNOSIS — J9819 Other pulmonary collapse: Secondary | ICD-10-CM | POA: Diagnosis not present

## 2013-09-14 DIAGNOSIS — R918 Other nonspecific abnormal finding of lung field: Secondary | ICD-10-CM | POA: Diagnosis not present

## 2013-09-14 DIAGNOSIS — Z992 Dependence on renal dialysis: Secondary | ICD-10-CM | POA: Diagnosis not present

## 2013-09-14 DIAGNOSIS — Z940 Kidney transplant status: Secondary | ICD-10-CM | POA: Diagnosis not present

## 2013-09-14 DIAGNOSIS — R1031 Right lower quadrant pain: Secondary | ICD-10-CM | POA: Diagnosis not present

## 2013-09-14 DIAGNOSIS — I517 Cardiomegaly: Secondary | ICD-10-CM | POA: Diagnosis not present

## 2013-09-14 DIAGNOSIS — R928 Other abnormal and inconclusive findings on diagnostic imaging of breast: Secondary | ICD-10-CM | POA: Diagnosis not present

## 2013-09-14 DIAGNOSIS — N039 Chronic nephritic syndrome with unspecified morphologic changes: Secondary | ICD-10-CM | POA: Diagnosis not present

## 2013-09-14 DIAGNOSIS — Z48298 Encounter for aftercare following other organ transplant: Secondary | ICD-10-CM | POA: Diagnosis not present

## 2013-09-14 DIAGNOSIS — T8690 Unspecified complication of unspecified transplanted organ and tissue: Secondary | ICD-10-CM | POA: Diagnosis not present

## 2013-09-14 DIAGNOSIS — R0989 Other specified symptoms and signs involving the circulatory and respiratory systems: Secondary | ICD-10-CM | POA: Diagnosis not present

## 2013-09-14 DIAGNOSIS — T8610 Unspecified complication of kidney transplant: Secondary | ICD-10-CM | POA: Diagnosis not present

## 2013-09-14 DIAGNOSIS — E875 Hyperkalemia: Secondary | ICD-10-CM | POA: Diagnosis present

## 2013-09-14 DIAGNOSIS — D631 Anemia in chronic kidney disease: Secondary | ICD-10-CM | POA: Diagnosis not present

## 2013-09-15 DIAGNOSIS — I120 Hypertensive chronic kidney disease with stage 5 chronic kidney disease or end stage renal disease: Secondary | ICD-10-CM | POA: Diagnosis not present

## 2013-09-15 DIAGNOSIS — Z79899 Other long term (current) drug therapy: Secondary | ICD-10-CM | POA: Diagnosis not present

## 2013-09-15 DIAGNOSIS — Z48298 Encounter for aftercare following other organ transplant: Secondary | ICD-10-CM | POA: Diagnosis not present

## 2013-09-15 DIAGNOSIS — Z940 Kidney transplant status: Secondary | ICD-10-CM | POA: Diagnosis not present

## 2013-09-15 DIAGNOSIS — N186 End stage renal disease: Secondary | ICD-10-CM | POA: Diagnosis not present

## 2013-09-15 DIAGNOSIS — N390 Urinary tract infection, site not specified: Secondary | ICD-10-CM | POA: Diagnosis not present

## 2013-09-16 DIAGNOSIS — Z298 Encounter for other specified prophylactic measures: Secondary | ICD-10-CM | POA: Diagnosis not present

## 2013-09-16 DIAGNOSIS — Z79899 Other long term (current) drug therapy: Secondary | ICD-10-CM | POA: Diagnosis not present

## 2013-09-16 DIAGNOSIS — I120 Hypertensive chronic kidney disease with stage 5 chronic kidney disease or end stage renal disease: Secondary | ICD-10-CM | POA: Diagnosis not present

## 2013-09-16 DIAGNOSIS — Z940 Kidney transplant status: Secondary | ICD-10-CM | POA: Diagnosis not present

## 2013-09-16 DIAGNOSIS — N186 End stage renal disease: Secondary | ICD-10-CM | POA: Diagnosis not present

## 2013-09-16 DIAGNOSIS — T8610 Unspecified complication of kidney transplant: Secondary | ICD-10-CM | POA: Diagnosis not present

## 2013-09-16 DIAGNOSIS — N390 Urinary tract infection, site not specified: Secondary | ICD-10-CM | POA: Diagnosis not present

## 2013-09-16 DIAGNOSIS — Z48298 Encounter for aftercare following other organ transplant: Secondary | ICD-10-CM | POA: Diagnosis not present

## 2013-09-17 DIAGNOSIS — Z9889 Other specified postprocedural states: Secondary | ICD-10-CM | POA: Insufficient documentation

## 2013-09-17 DIAGNOSIS — Z905 Acquired absence of kidney: Secondary | ICD-10-CM | POA: Insufficient documentation

## 2013-09-21 DIAGNOSIS — N186 End stage renal disease: Secondary | ICD-10-CM | POA: Diagnosis not present

## 2013-09-23 DIAGNOSIS — N186 End stage renal disease: Secondary | ICD-10-CM | POA: Diagnosis not present

## 2013-09-23 DIAGNOSIS — D631 Anemia in chronic kidney disease: Secondary | ICD-10-CM | POA: Diagnosis not present

## 2013-09-23 DIAGNOSIS — Z23 Encounter for immunization: Secondary | ICD-10-CM | POA: Diagnosis not present

## 2013-09-24 DIAGNOSIS — Z9889 Other specified postprocedural states: Secondary | ICD-10-CM | POA: Diagnosis not present

## 2013-09-24 DIAGNOSIS — K5900 Constipation, unspecified: Secondary | ICD-10-CM | POA: Diagnosis not present

## 2013-09-24 DIAGNOSIS — Z992 Dependence on renal dialysis: Secondary | ICD-10-CM | POA: Diagnosis not present

## 2013-09-24 DIAGNOSIS — Z79899 Other long term (current) drug therapy: Secondary | ICD-10-CM | POA: Diagnosis not present

## 2013-09-24 DIAGNOSIS — Z5181 Encounter for therapeutic drug level monitoring: Secondary | ICD-10-CM | POA: Diagnosis not present

## 2013-09-24 DIAGNOSIS — R141 Gas pain: Secondary | ICD-10-CM | POA: Diagnosis not present

## 2013-09-24 DIAGNOSIS — I10 Essential (primary) hypertension: Secondary | ICD-10-CM | POA: Diagnosis not present

## 2013-09-24 DIAGNOSIS — R143 Flatulence: Secondary | ICD-10-CM | POA: Diagnosis not present

## 2013-09-24 DIAGNOSIS — N186 End stage renal disease: Secondary | ICD-10-CM | POA: Diagnosis not present

## 2013-09-24 DIAGNOSIS — R112 Nausea with vomiting, unspecified: Secondary | ICD-10-CM | POA: Diagnosis not present

## 2013-09-24 DIAGNOSIS — I120 Hypertensive chronic kidney disease with stage 5 chronic kidney disease or end stage renal disease: Secondary | ICD-10-CM | POA: Diagnosis not present

## 2013-09-24 DIAGNOSIS — R1114 Bilious vomiting: Secondary | ICD-10-CM | POA: Diagnosis not present

## 2013-09-24 DIAGNOSIS — E119 Type 2 diabetes mellitus without complications: Secondary | ICD-10-CM | POA: Diagnosis not present

## 2013-09-24 DIAGNOSIS — E669 Obesity, unspecified: Secondary | ICD-10-CM | POA: Diagnosis not present

## 2013-09-29 ENCOUNTER — Ambulatory Visit (INDEPENDENT_AMBULATORY_CARE_PROVIDER_SITE_OTHER): Payer: Medicare Other | Admitting: General Surgery

## 2013-10-05 DIAGNOSIS — Z940 Kidney transplant status: Secondary | ICD-10-CM | POA: Diagnosis not present

## 2013-10-21 DIAGNOSIS — N186 End stage renal disease: Secondary | ICD-10-CM | POA: Diagnosis not present

## 2013-10-23 DIAGNOSIS — D631 Anemia in chronic kidney disease: Secondary | ICD-10-CM | POA: Diagnosis not present

## 2013-10-23 DIAGNOSIS — N186 End stage renal disease: Secondary | ICD-10-CM | POA: Diagnosis not present

## 2013-10-26 DIAGNOSIS — N186 End stage renal disease: Secondary | ICD-10-CM | POA: Diagnosis not present

## 2013-10-26 DIAGNOSIS — D631 Anemia in chronic kidney disease: Secondary | ICD-10-CM | POA: Diagnosis not present

## 2013-10-28 DIAGNOSIS — D631 Anemia in chronic kidney disease: Secondary | ICD-10-CM | POA: Diagnosis not present

## 2013-10-28 DIAGNOSIS — N186 End stage renal disease: Secondary | ICD-10-CM | POA: Diagnosis not present

## 2013-10-30 DIAGNOSIS — N186 End stage renal disease: Secondary | ICD-10-CM | POA: Diagnosis not present

## 2013-10-30 DIAGNOSIS — D631 Anemia in chronic kidney disease: Secondary | ICD-10-CM | POA: Diagnosis not present

## 2013-11-02 DIAGNOSIS — N186 End stage renal disease: Secondary | ICD-10-CM | POA: Diagnosis not present

## 2013-11-02 DIAGNOSIS — D631 Anemia in chronic kidney disease: Secondary | ICD-10-CM | POA: Diagnosis not present

## 2013-11-04 DIAGNOSIS — N186 End stage renal disease: Secondary | ICD-10-CM | POA: Diagnosis not present

## 2013-11-04 DIAGNOSIS — D631 Anemia in chronic kidney disease: Secondary | ICD-10-CM | POA: Diagnosis not present

## 2013-11-06 DIAGNOSIS — D631 Anemia in chronic kidney disease: Secondary | ICD-10-CM | POA: Diagnosis not present

## 2013-11-06 DIAGNOSIS — N186 End stage renal disease: Secondary | ICD-10-CM | POA: Diagnosis not present

## 2013-11-09 DIAGNOSIS — D631 Anemia in chronic kidney disease: Secondary | ICD-10-CM | POA: Diagnosis not present

## 2013-11-09 DIAGNOSIS — N186 End stage renal disease: Secondary | ICD-10-CM | POA: Diagnosis not present

## 2013-11-11 DIAGNOSIS — N186 End stage renal disease: Secondary | ICD-10-CM | POA: Diagnosis not present

## 2013-11-11 DIAGNOSIS — D631 Anemia in chronic kidney disease: Secondary | ICD-10-CM | POA: Diagnosis not present

## 2013-11-13 DIAGNOSIS — N186 End stage renal disease: Secondary | ICD-10-CM | POA: Diagnosis not present

## 2013-11-13 DIAGNOSIS — D631 Anemia in chronic kidney disease: Secondary | ICD-10-CM | POA: Diagnosis not present

## 2013-11-16 DIAGNOSIS — D631 Anemia in chronic kidney disease: Secondary | ICD-10-CM | POA: Diagnosis not present

## 2013-11-16 DIAGNOSIS — N186 End stage renal disease: Secondary | ICD-10-CM | POA: Diagnosis not present

## 2013-11-18 DIAGNOSIS — D631 Anemia in chronic kidney disease: Secondary | ICD-10-CM | POA: Diagnosis not present

## 2013-11-18 DIAGNOSIS — N186 End stage renal disease: Secondary | ICD-10-CM | POA: Diagnosis not present

## 2013-11-20 DIAGNOSIS — N186 End stage renal disease: Secondary | ICD-10-CM | POA: Diagnosis not present

## 2013-11-20 DIAGNOSIS — D631 Anemia in chronic kidney disease: Secondary | ICD-10-CM | POA: Diagnosis not present

## 2013-11-21 DIAGNOSIS — N186 End stage renal disease: Secondary | ICD-10-CM | POA: Diagnosis not present

## 2013-11-21 DIAGNOSIS — Z992 Dependence on renal dialysis: Secondary | ICD-10-CM | POA: Diagnosis not present

## 2013-11-23 DIAGNOSIS — N186 End stage renal disease: Secondary | ICD-10-CM | POA: Diagnosis not present

## 2013-11-23 DIAGNOSIS — D631 Anemia in chronic kidney disease: Secondary | ICD-10-CM | POA: Diagnosis not present

## 2013-11-25 DIAGNOSIS — N186 End stage renal disease: Secondary | ICD-10-CM | POA: Diagnosis not present

## 2013-11-25 DIAGNOSIS — D631 Anemia in chronic kidney disease: Secondary | ICD-10-CM | POA: Diagnosis not present

## 2013-11-26 ENCOUNTER — Other Ambulatory Visit: Payer: Medicare Other

## 2013-11-26 DIAGNOSIS — I10 Essential (primary) hypertension: Secondary | ICD-10-CM | POA: Diagnosis not present

## 2013-11-26 DIAGNOSIS — E1165 Type 2 diabetes mellitus with hyperglycemia: Secondary | ICD-10-CM | POA: Diagnosis not present

## 2013-11-26 DIAGNOSIS — D631 Anemia in chronic kidney disease: Secondary | ICD-10-CM

## 2013-11-26 DIAGNOSIS — IMO0002 Reserved for concepts with insufficient information to code with codable children: Secondary | ICD-10-CM

## 2013-11-26 DIAGNOSIS — N189 Chronic kidney disease, unspecified: Secondary | ICD-10-CM

## 2013-11-26 DIAGNOSIS — E785 Hyperlipidemia, unspecified: Secondary | ICD-10-CM | POA: Diagnosis not present

## 2013-11-26 DIAGNOSIS — E1129 Type 2 diabetes mellitus with other diabetic kidney complication: Secondary | ICD-10-CM | POA: Diagnosis not present

## 2013-11-27 DIAGNOSIS — D631 Anemia in chronic kidney disease: Secondary | ICD-10-CM | POA: Diagnosis not present

## 2013-11-27 DIAGNOSIS — N186 End stage renal disease: Secondary | ICD-10-CM | POA: Diagnosis not present

## 2013-11-27 LAB — CBC WITH DIFFERENTIAL
BASOS ABS: 0.1 10*3/uL (ref 0.0–0.2)
Basos: 1 %
Eos: 3 %
Eosinophils Absolute: 0.2 10*3/uL (ref 0.0–0.4)
HCT: 40.5 % (ref 37.5–51.0)
Hemoglobin: 13.5 g/dL (ref 12.6–17.7)
Immature Grans (Abs): 0 10*3/uL (ref 0.0–0.1)
Immature Granulocytes: 0 %
LYMPHS ABS: 1.3 10*3/uL (ref 0.7–3.1)
Lymphs: 26 %
MCH: 32.1 pg (ref 26.6–33.0)
MCHC: 33.3 g/dL (ref 31.5–35.7)
MCV: 96 fL (ref 79–97)
MONOS ABS: 0.5 10*3/uL (ref 0.1–0.9)
Monocytes: 10 %
Neutrophils Absolute: 3 10*3/uL (ref 1.4–7.0)
Neutrophils Relative %: 60 %
Platelets: 141 10*3/uL — ABNORMAL LOW (ref 150–379)
RBC: 4.2 x10E6/uL (ref 4.14–5.80)
RDW: 16.7 % — AB (ref 12.3–15.4)
WBC: 5 10*3/uL (ref 3.4–10.8)

## 2013-11-27 LAB — HEMOGLOBIN A1C
Est. average glucose Bld gHb Est-mCnc: 117 mg/dL
Hgb A1c MFr Bld: 5.7 % — ABNORMAL HIGH (ref 4.8–5.6)

## 2013-11-27 LAB — BASIC METABOLIC PANEL
BUN/Creatinine Ratio: 4 — ABNORMAL LOW (ref 9–20)
BUN: 38 mg/dL — ABNORMAL HIGH (ref 6–24)
CHLORIDE: 94 mmol/L — AB (ref 97–108)
CO2: 21 mmol/L (ref 18–29)
Calcium: 10.9 mg/dL — ABNORMAL HIGH (ref 8.7–10.2)
Creatinine, Ser: 10.75 mg/dL — ABNORMAL HIGH (ref 0.76–1.27)
GFR, EST AFRICAN AMERICAN: 6 mL/min/{1.73_m2} — AB (ref >59)
GFR, EST NON AFRICAN AMERICAN: 5 mL/min/{1.73_m2} — AB (ref >59)
GLUCOSE: 146 mg/dL — AB (ref 65–99)
POTASSIUM: 4.9 mmol/L (ref 3.5–5.2)
SODIUM: 136 mmol/L (ref 134–144)

## 2013-11-29 ENCOUNTER — Other Ambulatory Visit: Payer: Self-pay | Admitting: Internal Medicine

## 2013-11-30 DIAGNOSIS — D631 Anemia in chronic kidney disease: Secondary | ICD-10-CM | POA: Diagnosis not present

## 2013-11-30 DIAGNOSIS — N186 End stage renal disease: Secondary | ICD-10-CM | POA: Diagnosis not present

## 2013-12-02 ENCOUNTER — Encounter: Payer: Self-pay | Admitting: Internal Medicine

## 2013-12-02 ENCOUNTER — Ambulatory Visit (INDEPENDENT_AMBULATORY_CARE_PROVIDER_SITE_OTHER): Payer: Medicare Other | Admitting: Internal Medicine

## 2013-12-02 VITALS — BP 140/70 | HR 104 | Temp 97.8°F | Ht 68.5 in | Wt 224.0 lb

## 2013-12-02 DIAGNOSIS — E669 Obesity, unspecified: Secondary | ICD-10-CM

## 2013-12-02 DIAGNOSIS — R609 Edema, unspecified: Secondary | ICD-10-CM

## 2013-12-02 DIAGNOSIS — I10 Essential (primary) hypertension: Secondary | ICD-10-CM

## 2013-12-02 DIAGNOSIS — D631 Anemia in chronic kidney disease: Secondary | ICD-10-CM

## 2013-12-02 DIAGNOSIS — G44209 Tension-type headache, unspecified, not intractable: Secondary | ICD-10-CM

## 2013-12-02 DIAGNOSIS — N186 End stage renal disease: Secondary | ICD-10-CM | POA: Diagnosis not present

## 2013-12-02 DIAGNOSIS — Z992 Dependence on renal dialysis: Secondary | ICD-10-CM

## 2013-12-02 DIAGNOSIS — R1903 Right lower quadrant abdominal swelling, mass and lump: Secondary | ICD-10-CM

## 2013-12-02 DIAGNOSIS — R413 Other amnesia: Secondary | ICD-10-CM | POA: Diagnosis not present

## 2013-12-02 DIAGNOSIS — N189 Chronic kidney disease, unspecified: Secondary | ICD-10-CM | POA: Diagnosis not present

## 2013-12-02 DIAGNOSIS — E1121 Type 2 diabetes mellitus with diabetic nephropathy: Secondary | ICD-10-CM

## 2013-12-02 DIAGNOSIS — E785 Hyperlipidemia, unspecified: Secondary | ICD-10-CM

## 2013-12-02 MED ORDER — TRAMADOL HCL 50 MG PO TABS: ORAL_TABLET | ORAL | Status: DC

## 2013-12-02 NOTE — Progress Notes (Signed)
Patient ID: Kirk [name], male   DOB: [birth year], 53 y.o.   MRN: [id number]    Facility  [facility]    Place of Service:   OFFICE   Allergies  Allergen Reactions  . Codeine Nausea And Vomiting and Nausea Only    Chief Complaint  Patient presents with  . 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 It has been surgically removed.  Type 2 diabetes mellitus with diabetic nephropathy - well-controlled at this time  End-stage renal disease on hemodialysis:Continues. Stable.  Memory loss: still has changes in his memory. Short-term memory is most affected. This did not improve when the idead kidney was removed.  Obesity:patient is losing weight  Tension headache -benefits from traMADol (ULTRAM) 50 MG tablet  Hyperlipemia - recheck in the future  Medications: Patient's Medications  New Prescriptions   No medications on file  Previous Medications   ACETAMINOPHEN (TYLENOL) 500 MG TABLET    Take 500 mg by mouth every 4 (four) hours as needed for mild pain.    ALBUTEROL-IPRATROPIUM (COMBIVENT) 18-103 MCG/ACT INHALER    Inhale 2 puffs into the lungs every 6 (six) hours as needed for wheezing.   AMLODIPINE (NORVASC) 10 MG TABLET    Take 10 mg by mouth daily.   ASPIRIN EC 81 MG TABLET    Take 81 mg by mouth daily.   B COMPLEX-C-FOLIC ACID (NEPHRO-VITE PO)    Take by mouth.   B COMPLEX-VITAMIN C-FOLIC ACID (NEPHRO-VITE) 0.8 MG TABS TABLET    TAKE 1 TABLET BY MOUTH EVERY DAY   CALCIUM ACETATE (PHOSLO) 667 MG CAPSULE    Take 667 mg by mouth See admin instructions. 4 by mouth with each meal and 2 by mouth with snacks   CARVEDILOL (COREG) 25 MG TABLET    Take 25 mg by mouth 2 (two) times daily. Take one tablet in the morning and one tablet in the  evening   CHOLECALCIFEROL 5000 UNITS TABS    Take 5,000 Units by mouth every morning.   DIPHENHYDRAMINE (BENADRYL) 25 MG TABLET    Take 25 mg by mouth 2 (two) times daily.    DOXAZOSIN (CARDURA) 4 MG TABLET    Take 2 mg by mouth daily. Take one tablet by mouth once daily to regulate blood pressure   ONDANSETRON (ZOFRAN) 4 MG TABLET    Take 4 mg by mouth every 8 (eight) hours as needed for nausea.   PRAVASTATIN (PRAVACHOL) 40 MG TABLET    Take 40 mg by mouth every evening.   TRAMADOL (ULTRAM) 50 MG TABLET    Take 50 mg by mouth 2 (two) times daily as needed.  Modified Medications   No medications on file  Discontinued Medications   CEPHALEXIN (KEFLEX) 250 MG CAPSULE    Take 1 capsule (250 mg total) by mouth 2 (two) times daily.   HYDRALAZINE (APRESOLINE) 25 MG TABLET    Take 25 mg by mouth 3 (three) times daily.    PREDNISONE (DELTASONE) 5 MG TABLET    Take 5 mg by mouth daily.   TACROLIMUS (PROGRAF) 1 MG CAPSULE    Take 4 mg by mouth 2 (two) times daily.      Review of Systems  Constitutional: Negative for fever.  Chronically fatigued.  Eyes: Negative.   Respiratory: Negative for shortness of breath.        Dyspnea on exertion.  Cardiovascular: Negative for chest pain, palpitations and leg swelling.  Gastrointestinal: Negative for abdominal pain and abdominal distention.  Endocrine:       Diabetes is controlled.  Genitourinary: Negative.        Renal failure and kidney transplant.  Musculoskeletal:       Neck discomfort. Chronic right knee discomfort. Chronic back pains. Unsteady gait.  Neurological: Positive for dizziness.       Memory is worse. Feet feel numb. Recurrent headaches  Hematological:       Anemia of CKD.  Psychiatric/Behavioral: Positive for confusion and decreased concentration.       Insomnia. Falls asleep in the day.    Filed Vitals:   12/02/13 1546  BP: 140/70  Pulse: 104  Temp: 97.8 F (36.6 C)  TempSrc: Oral  Height: 5' 8.5" (1.74 m)  Weight:  224 lb (101.606 kg)  SpO2: 97%   Body mass index is 33.56 kg/(m^2).  Physical Exam  Constitutional: He is oriented to person, place, and time.  Obese.  HENT:  Loss of hearing.  Eyes:  Corrective lenses  Neck: No JVD present. No tracheal deviation present. No thyromegaly present.  Cardiovascular: Exam reveals no gallop.   No murmur heard. AF. 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  Abdominal: Soft. Bowel sounds are normal. He exhibits no distension and no mass. There is no tenderness.  Musculoskeletal: Normal range of motion. He exhibits tenderness. He exhibits no edema.  Scar left knee. Tender right knee  Lymphadenopathy:    He has no cervical adenopathy.  Neurological: He is alert and oriented to person, place, and time. No cranial nerve deficit. Coordination normal.  Positive Tinel sign bilaterally  Skin: No rash noted. No erythema. No pallor.  Psychiatric:  Slow. Depressed acting.     Labs reviewed: Appointment on 11/26/2013  Component Date Value Ref Range Status  . Hgb A1c MFr Bld 11/26/2013 5.7* 4.8 - 5.6 % Final   Comment:          Pre-diabetes: 5.7 - 6.4          Diabetes: >6.4          Glycemic control for adults with diabetes: <7.0   . Est. average glucose Bld gHb Est-m* 11/26/2013 117   Final  . Glucose 11/26/2013 146* 65 - 99 mg/dL Final  . BUN 11/26/2013 38* 6 - 24 mg/dL Final  . Creatinine, Ser 11/26/2013 10.75* 0.76 - 1.27 mg/dL Final   **Verified by repeat analysis**  . GFR calc non Af Amer 11/26/2013 5* >59 mL/min/1.73 Final  . GFR calc Af Amer 11/26/2013 6* >59 mL/min/1.73 Final  . BUN/Creatinine Ratio 11/26/2013 4* 9 - 20 Final  . Sodium 11/26/2013 136  134 - 144 mmol/L Final  . Potassium 11/26/2013 4.9  3.5 - 5.2 mmol/L Final  . Chloride 11/26/2013 94* 97 - 108 mmol/L Final  . CO2 11/26/2013 21  18 - 29 mmol/L Final  . Calcium 11/26/2013 10.9* 8.7 - 10.2 mg/dL Final  . WBC 11/26/2013 5.0  3.4 - 10.8 x10E3/uL Final  . RBC 11/26/2013 4.20  4.14 - 5.80  x10E6/uL Final  . Hemoglobin 11/26/2013 13.5  12.6 - 17.7 g/dL Final  . HCT 11/26/2013 40.5  37.5 - 51.0 % Final  . MCV 11/26/2013 96  79 - 97 fL Final  . MCH 11/26/2013 32.1  26.6 - 33.0  pg Final  . MCHC 11/26/2013 33.3  31.5 - 35.7 g/dL Final  . RDW 11/26/2013 16.7* 12.3 - 15.4 % Final  . Platelets 11/26/2013 141* 150 - 379 x10E3/uL Final  . Neutrophils Relative % 11/26/2013 60   Final  . Lymphs 11/26/2013 26   Final  . Monocytes 11/26/2013 10   Final  . Eos 11/26/2013 3   Final  . Basos 11/26/2013 1   Final  . Neutrophils Absolute 11/26/2013 3.0  1.4 - 7.0 x10E3/uL Final  . Lymphocytes Absolute 11/26/2013 1.3  0.7 - 3.1 x10E3/uL Final  . Monocytes Absolute 11/26/2013 0.5  0.1 - 0.9 x10E3/uL Final  . Eosinophils Absolute 11/26/2013 0.2  0.0 - 0.4 x10E3/uL Final  . Basophils Absolute 11/26/2013 0.1  0.0 - 0.2 x10E3/uL Final  . Immature Granulocytes 11/26/2013 0   Final  . Immature Grans (Abs) 11/26/2013 0.0  0.0 - 0.1 x10E3/uL Final  Admission on 09/08/2013, Discharged on 09/08/2013  Component Date Value Ref Range Status  . Color, Urine 09/08/2013 RED* YELLOW Final   BIOCHEMICALS MAY BE AFFECTED BY COLOR  . APPearance 09/08/2013 CLOUDY* CLEAR Final  . Specific Gravity, Urine 09/08/2013 1.020  1.005 - 1.030 Final  . pH 09/08/2013 6.5  5.0 - 8.0 Final  . Glucose, UA 09/08/2013 NEGATIVE  NEGATIVE mg/dL Final  . Hgb urine dipstick 09/08/2013 LARGE* NEGATIVE Final  . Bilirubin Urine 09/08/2013 SMALL* NEGATIVE Final  . Ketones, ur 09/08/2013 15* NEGATIVE mg/dL Final  . Protein, ur 09/08/2013 >300* NEGATIVE mg/dL Final  . Urobilinogen, UA 09/08/2013 0.2  0.0 - 1.0 mg/dL Final  . Nitrite 09/08/2013 NEGATIVE  NEGATIVE Final  . Leukocytes, UA 09/08/2013 SMALL* NEGATIVE Final  . WBC 09/08/2013 5.5  4.0 - 10.5 K/uL Final  . RBC 09/08/2013 2.89* 4.22 - 5.81 MIL/uL Final  . Hemoglobin 09/08/2013 8.8* 13.0 - 17.0 g/dL Final  . HCT 09/08/2013 27.9* 39.0 - 52.0 % Final  . MCV 09/08/2013 96.5   78.0 - 100.0 fL Final  . MCH 09/08/2013 30.4  26.0 - 34.0 pg Final  . MCHC 09/08/2013 31.5  30.0 - 36.0 g/dL Final  . RDW 09/08/2013 14.4  11.5 - 15.5 % Final  . Platelets 09/08/2013 143* 150 - 400 K/uL Final  . Sodium 09/08/2013 136* 137 - 147 mEq/L Final  . Potassium 09/08/2013 4.9  3.7 - 5.3 mEq/L Final  . Chloride 09/08/2013 95* 96 - 112 mEq/L Final  . CO2 09/08/2013 22  19 - 32 mEq/L Final  . Glucose, Bld 09/08/2013 141* 70 - 99 mg/dL Final  . BUN 09/08/2013 43* 6 - 23 mg/dL Final  . Creatinine, Ser 09/08/2013 10.18* 0.50 - 1.35 mg/dL Final  . Calcium 09/08/2013 9.0  8.4 - 10.5 mg/dL Final  . Total Protein 09/08/2013 7.8  6.0 - 8.3 g/dL Final  . Albumin 09/08/2013 3.0* 3.5 - 5.2 g/dL Final  . AST 09/08/2013 26  0 - 37 U/L Final  . ALT 09/08/2013 37  0 - 53 U/L Final  . Alkaline Phosphatase 09/08/2013 135* 39 - 117 U/L Final  . Total Bilirubin 09/08/2013 0.3  0.3 - 1.2 mg/dL Final  . GFR calc non Af Amer 09/08/2013 5* >90 mL/min Final  . GFR calc Af Amer 09/08/2013 6* >90 mL/min Final   Comment: (NOTE)                          The eGFR has been calculated using the CKD EPI equation.  This calculation has not been validated in all clinical situations.                          eGFR's persistently <90 mL/min signify possible Chronic Kidney                          Disease.  . Anion gap 09/08/2013 19* 5 - 15 Final  . Sodium 09/08/2013 134* 137 - 147 mEq/L Final  . Potassium 09/08/2013 4.7  3.7 - 5.3 mEq/L Final  . Chloride 09/08/2013 100  96 - 112 mEq/L Final  . BUN 09/08/2013 40* 6 - 23 mg/dL Final  . Creatinine, Ser 09/08/2013 11.90* 0.50 - 1.35 mg/dL Final  . Glucose, Bld 09/08/2013 144* 70 - 99 mg/dL Final  . Calcium, Ion 09/08/2013 1.02* 1.12 - 1.23 mmol/L Final  . TCO2 09/08/2013 24  0 - 100 mmol/L Final  . Hemoglobin 09/08/2013 9.2* 13.0 - 17.0 g/dL Final  . HCT 09/08/2013 27.0* 39.0 - 52.0 % Final  . Squamous Epithelial / LPF 09/08/2013 FEW* RARE  Final  . WBC, UA 09/08/2013 11-20  <3 WBC/hpf Final  . RBC / HPF 09/08/2013 TOO NUMEROUS TO COUNT  <3 RBC/hpf Final  . Bacteria, UA 09/08/2013 FEW* RARE Final  . Edwina Barth 09/08/2013 URINALYSIS PERFORMED ON SUPERNATANT   Final     Assessment/Plan  1. Edema improved  2. Anemia in chronic renal disease improved - CBC With differential/Platelet; Future  3. Essential hypertension Normal blood pressure  4. RLQ abdominal mass Resolved by surgical removal of previously transplanted kidney  5. Type 2 diabetes mellitus with diabetic nephropathy Improved and controlled - Comprehensive metabolic panel; Future - Microalbumin, urine; Future  6. End-stage renal disease on hemodialysis Continue dialysis  7. Memory loss unchanged  8. Obesity Gradually losing weight  9. Tension headache chronic - traMADol (ULTRAM) 50 MG tablet; One up to 4 times daly if needed for pain  Dispense: 100 tablet; Refill: 3  10. Hyperlipemia - Lipid panel; Future

## 2013-12-04 DIAGNOSIS — N186 End stage renal disease: Secondary | ICD-10-CM | POA: Diagnosis not present

## 2013-12-04 DIAGNOSIS — D631 Anemia in chronic kidney disease: Secondary | ICD-10-CM | POA: Diagnosis not present

## 2013-12-07 DIAGNOSIS — N186 End stage renal disease: Secondary | ICD-10-CM | POA: Diagnosis not present

## 2013-12-07 DIAGNOSIS — D631 Anemia in chronic kidney disease: Secondary | ICD-10-CM | POA: Diagnosis not present

## 2013-12-09 DIAGNOSIS — N186 End stage renal disease: Secondary | ICD-10-CM | POA: Diagnosis not present

## 2013-12-09 DIAGNOSIS — D631 Anemia in chronic kidney disease: Secondary | ICD-10-CM | POA: Diagnosis not present

## 2013-12-11 DIAGNOSIS — N186 End stage renal disease: Secondary | ICD-10-CM | POA: Diagnosis not present

## 2013-12-11 DIAGNOSIS — D631 Anemia in chronic kidney disease: Secondary | ICD-10-CM | POA: Diagnosis not present

## 2013-12-13 DIAGNOSIS — D631 Anemia in chronic kidney disease: Secondary | ICD-10-CM | POA: Diagnosis not present

## 2013-12-13 DIAGNOSIS — N186 End stage renal disease: Secondary | ICD-10-CM | POA: Diagnosis not present

## 2013-12-15 DIAGNOSIS — N186 End stage renal disease: Secondary | ICD-10-CM | POA: Diagnosis not present

## 2013-12-15 DIAGNOSIS — D631 Anemia in chronic kidney disease: Secondary | ICD-10-CM | POA: Diagnosis not present

## 2013-12-18 DIAGNOSIS — N186 End stage renal disease: Secondary | ICD-10-CM | POA: Diagnosis not present

## 2013-12-18 DIAGNOSIS — D631 Anemia in chronic kidney disease: Secondary | ICD-10-CM | POA: Diagnosis not present

## 2013-12-21 DIAGNOSIS — N186 End stage renal disease: Secondary | ICD-10-CM | POA: Diagnosis not present

## 2013-12-21 DIAGNOSIS — D631 Anemia in chronic kidney disease: Secondary | ICD-10-CM | POA: Diagnosis not present

## 2013-12-21 DIAGNOSIS — Z992 Dependence on renal dialysis: Secondary | ICD-10-CM | POA: Diagnosis not present

## 2013-12-23 DIAGNOSIS — N186 End stage renal disease: Secondary | ICD-10-CM | POA: Diagnosis not present

## 2013-12-23 DIAGNOSIS — D631 Anemia in chronic kidney disease: Secondary | ICD-10-CM | POA: Diagnosis not present

## 2013-12-25 DIAGNOSIS — N186 End stage renal disease: Secondary | ICD-10-CM | POA: Diagnosis not present

## 2013-12-25 DIAGNOSIS — D631 Anemia in chronic kidney disease: Secondary | ICD-10-CM | POA: Diagnosis not present

## 2013-12-28 DIAGNOSIS — N186 End stage renal disease: Secondary | ICD-10-CM | POA: Diagnosis not present

## 2013-12-28 DIAGNOSIS — D631 Anemia in chronic kidney disease: Secondary | ICD-10-CM | POA: Diagnosis not present

## 2013-12-30 DIAGNOSIS — D631 Anemia in chronic kidney disease: Secondary | ICD-10-CM | POA: Diagnosis not present

## 2013-12-30 DIAGNOSIS — N186 End stage renal disease: Secondary | ICD-10-CM | POA: Diagnosis not present

## 2013-12-31 ENCOUNTER — Encounter (HOSPITAL_COMMUNITY): Payer: Self-pay | Admitting: Internal Medicine

## 2014-01-01 DIAGNOSIS — N186 End stage renal disease: Secondary | ICD-10-CM | POA: Diagnosis not present

## 2014-01-01 DIAGNOSIS — D631 Anemia in chronic kidney disease: Secondary | ICD-10-CM | POA: Diagnosis not present

## 2014-01-04 DIAGNOSIS — N186 End stage renal disease: Secondary | ICD-10-CM | POA: Diagnosis not present

## 2014-01-04 DIAGNOSIS — D631 Anemia in chronic kidney disease: Secondary | ICD-10-CM | POA: Diagnosis not present

## 2014-01-06 DIAGNOSIS — D631 Anemia in chronic kidney disease: Secondary | ICD-10-CM | POA: Diagnosis not present

## 2014-01-06 DIAGNOSIS — N186 End stage renal disease: Secondary | ICD-10-CM | POA: Diagnosis not present

## 2014-01-08 DIAGNOSIS — N186 End stage renal disease: Secondary | ICD-10-CM | POA: Diagnosis not present

## 2014-01-08 DIAGNOSIS — D631 Anemia in chronic kidney disease: Secondary | ICD-10-CM | POA: Diagnosis not present

## 2014-01-11 DIAGNOSIS — N186 End stage renal disease: Secondary | ICD-10-CM | POA: Diagnosis not present

## 2014-01-11 DIAGNOSIS — D631 Anemia in chronic kidney disease: Secondary | ICD-10-CM | POA: Diagnosis not present

## 2014-01-13 DIAGNOSIS — N186 End stage renal disease: Secondary | ICD-10-CM | POA: Diagnosis not present

## 2014-01-13 DIAGNOSIS — D631 Anemia in chronic kidney disease: Secondary | ICD-10-CM | POA: Diagnosis not present

## 2014-01-16 DIAGNOSIS — D631 Anemia in chronic kidney disease: Secondary | ICD-10-CM | POA: Diagnosis not present

## 2014-01-16 DIAGNOSIS — N186 End stage renal disease: Secondary | ICD-10-CM | POA: Diagnosis not present

## 2014-01-18 DIAGNOSIS — D631 Anemia in chronic kidney disease: Secondary | ICD-10-CM | POA: Diagnosis not present

## 2014-01-18 DIAGNOSIS — N186 End stage renal disease: Secondary | ICD-10-CM | POA: Diagnosis not present

## 2014-01-20 DIAGNOSIS — D631 Anemia in chronic kidney disease: Secondary | ICD-10-CM | POA: Diagnosis not present

## 2014-01-20 DIAGNOSIS — N186 End stage renal disease: Secondary | ICD-10-CM | POA: Diagnosis not present

## 2014-01-21 DIAGNOSIS — Z992 Dependence on renal dialysis: Secondary | ICD-10-CM | POA: Diagnosis not present

## 2014-01-21 DIAGNOSIS — N186 End stage renal disease: Secondary | ICD-10-CM | POA: Diagnosis not present

## 2014-01-23 DIAGNOSIS — D631 Anemia in chronic kidney disease: Secondary | ICD-10-CM | POA: Diagnosis not present

## 2014-01-23 DIAGNOSIS — N186 End stage renal disease: Secondary | ICD-10-CM | POA: Diagnosis not present

## 2014-01-25 DIAGNOSIS — N186 End stage renal disease: Secondary | ICD-10-CM | POA: Diagnosis not present

## 2014-01-25 DIAGNOSIS — D631 Anemia in chronic kidney disease: Secondary | ICD-10-CM | POA: Diagnosis not present

## 2014-01-27 DIAGNOSIS — D631 Anemia in chronic kidney disease: Secondary | ICD-10-CM | POA: Diagnosis not present

## 2014-01-27 DIAGNOSIS — N186 End stage renal disease: Secondary | ICD-10-CM | POA: Diagnosis not present

## 2014-01-29 DIAGNOSIS — D631 Anemia in chronic kidney disease: Secondary | ICD-10-CM | POA: Diagnosis not present

## 2014-01-29 DIAGNOSIS — N186 End stage renal disease: Secondary | ICD-10-CM | POA: Diagnosis not present

## 2014-02-01 DIAGNOSIS — N186 End stage renal disease: Secondary | ICD-10-CM | POA: Diagnosis not present

## 2014-02-01 DIAGNOSIS — D631 Anemia in chronic kidney disease: Secondary | ICD-10-CM | POA: Diagnosis not present

## 2014-02-03 DIAGNOSIS — D631 Anemia in chronic kidney disease: Secondary | ICD-10-CM | POA: Diagnosis not present

## 2014-02-03 DIAGNOSIS — N186 End stage renal disease: Secondary | ICD-10-CM | POA: Diagnosis not present

## 2014-02-05 DIAGNOSIS — D631 Anemia in chronic kidney disease: Secondary | ICD-10-CM | POA: Diagnosis not present

## 2014-02-05 DIAGNOSIS — N186 End stage renal disease: Secondary | ICD-10-CM | POA: Diagnosis not present

## 2014-02-08 DIAGNOSIS — D631 Anemia in chronic kidney disease: Secondary | ICD-10-CM | POA: Diagnosis not present

## 2014-02-08 DIAGNOSIS — N186 End stage renal disease: Secondary | ICD-10-CM | POA: Diagnosis not present

## 2014-02-10 DIAGNOSIS — D631 Anemia in chronic kidney disease: Secondary | ICD-10-CM | POA: Diagnosis not present

## 2014-02-10 DIAGNOSIS — N186 End stage renal disease: Secondary | ICD-10-CM | POA: Diagnosis not present

## 2014-02-12 DIAGNOSIS — N186 End stage renal disease: Secondary | ICD-10-CM | POA: Diagnosis not present

## 2014-02-12 DIAGNOSIS — D631 Anemia in chronic kidney disease: Secondary | ICD-10-CM | POA: Diagnosis not present

## 2014-02-15 DIAGNOSIS — D631 Anemia in chronic kidney disease: Secondary | ICD-10-CM | POA: Diagnosis not present

## 2014-02-15 DIAGNOSIS — N186 End stage renal disease: Secondary | ICD-10-CM | POA: Diagnosis not present

## 2014-02-16 ENCOUNTER — Encounter (HOSPITAL_COMMUNITY): Payer: Self-pay

## 2014-02-16 ENCOUNTER — Observation Stay (HOSPITAL_COMMUNITY)
Admission: EM | Admit: 2014-02-16 | Discharge: 2014-02-17 | Disposition: A | Discharge status: Home / Self Care | Payer: Medicare Other | Attending: Internal Medicine | Admitting: Internal Medicine

## 2014-02-16 DIAGNOSIS — Z885 Allergy status to narcotic agent status: Secondary | ICD-10-CM | POA: Insufficient documentation

## 2014-02-16 DIAGNOSIS — D696 Thrombocytopenia, unspecified: Secondary | ICD-10-CM | POA: Insufficient documentation

## 2014-02-16 DIAGNOSIS — E559 Vitamin D deficiency, unspecified: Secondary | ICD-10-CM | POA: Insufficient documentation

## 2014-02-16 DIAGNOSIS — Z992 Dependence on renal dialysis: Secondary | ICD-10-CM | POA: Insufficient documentation

## 2014-02-16 DIAGNOSIS — G4733 Obstructive sleep apnea (adult) (pediatric): Secondary | ICD-10-CM | POA: Diagnosis not present

## 2014-02-16 DIAGNOSIS — I120 Hypertensive chronic kidney disease with stage 5 chronic kidney disease or end stage renal disease: Secondary | ICD-10-CM | POA: Diagnosis not present

## 2014-02-16 DIAGNOSIS — I10 Essential (primary) hypertension: Secondary | ICD-10-CM | POA: Diagnosis present

## 2014-02-16 DIAGNOSIS — R109 Unspecified abdominal pain: Secondary | ICD-10-CM | POA: Insufficient documentation

## 2014-02-16 DIAGNOSIS — E785 Hyperlipidemia, unspecified: Secondary | ICD-10-CM | POA: Insufficient documentation

## 2014-02-16 DIAGNOSIS — N189 Chronic kidney disease, unspecified: Secondary | ICD-10-CM

## 2014-02-16 DIAGNOSIS — R1084 Generalized abdominal pain: Secondary | ICD-10-CM | POA: Diagnosis not present

## 2014-02-16 DIAGNOSIS — N186 End stage renal disease: Secondary | ICD-10-CM | POA: Diagnosis not present

## 2014-02-16 DIAGNOSIS — Z87891 Personal history of nicotine dependence: Secondary | ICD-10-CM | POA: Insufficient documentation

## 2014-02-16 DIAGNOSIS — G608 Other hereditary and idiopathic neuropathies: Secondary | ICD-10-CM | POA: Insufficient documentation

## 2014-02-16 DIAGNOSIS — E1121 Type 2 diabetes mellitus with diabetic nephropathy: Secondary | ICD-10-CM

## 2014-02-16 DIAGNOSIS — E1140 Type 2 diabetes mellitus with diabetic neuropathy, unspecified: Secondary | ICD-10-CM | POA: Diagnosis not present

## 2014-02-16 DIAGNOSIS — E875 Hyperkalemia: Secondary | ICD-10-CM | POA: Diagnosis not present

## 2014-02-16 DIAGNOSIS — Z940 Kidney transplant status: Secondary | ICD-10-CM | POA: Diagnosis not present

## 2014-02-16 DIAGNOSIS — F419 Anxiety disorder, unspecified: Secondary | ICD-10-CM | POA: Diagnosis not present

## 2014-02-16 DIAGNOSIS — N529 Male erectile dysfunction, unspecified: Secondary | ICD-10-CM | POA: Insufficient documentation

## 2014-02-16 DIAGNOSIS — D631 Anemia in chronic kidney disease: Secondary | ICD-10-CM | POA: Insufficient documentation

## 2014-02-16 DIAGNOSIS — R197 Diarrhea, unspecified: Secondary | ICD-10-CM

## 2014-02-16 LAB — LIPASE, BLOOD: Lipase: 80 U/L — ABNORMAL HIGH (ref 11–59)

## 2014-02-16 LAB — COMPREHENSIVE METABOLIC PANEL
ALT: 29 U/L (ref 0–53)
ANION GAP: 15 (ref 5–15)
AST: 29 U/L (ref 0–37)
Albumin: 3.8 g/dL (ref 3.5–5.2)
Alkaline Phosphatase: 54 U/L (ref 39–117)
BUN: 49 mg/dL — ABNORMAL HIGH (ref 6–23)
CO2: 21 mmol/L (ref 19–32)
CREATININE: 12.47 mg/dL — AB (ref 0.50–1.35)
Calcium: 9.3 mg/dL (ref 8.4–10.5)
Chloride: 102 mmol/L (ref 96–112)
GFR calc non Af Amer: 4 mL/min — ABNORMAL LOW (ref >90)
GFR, EST AFRICAN AMERICAN: 5 mL/min — AB (ref >90)
Glucose, Bld: 169 mg/dL — ABNORMAL HIGH (ref 70–99)
Potassium: 5.2 mmol/L — ABNORMAL HIGH (ref 3.5–5.1)
SODIUM: 138 mmol/L (ref 135–145)
Total Bilirubin: 0.6 mg/dL (ref 0.3–1.2)
Total Protein: 7.3 g/dL (ref 6.0–8.3)

## 2014-02-16 LAB — I-STAT TROPONIN, ED: Troponin i, poc: 0 ng/mL (ref 0.00–0.08)

## 2014-02-16 LAB — CBC WITH DIFFERENTIAL/PLATELET
BASOS ABS: 0 10*3/uL (ref 0.0–0.1)
BASOS PCT: 1 % (ref 0–1)
EOS ABS: 0.1 10*3/uL (ref 0.0–0.7)
Eosinophils Relative: 2 % (ref 0–5)
HCT: 35.8 % — ABNORMAL LOW (ref 39.0–52.0)
HEMOGLOBIN: 11.8 g/dL — AB (ref 13.0–17.0)
LYMPHS PCT: 9 % — AB (ref 12–46)
Lymphs Abs: 0.4 10*3/uL — ABNORMAL LOW (ref 0.7–4.0)
MCH: 35 pg — ABNORMAL HIGH (ref 26.0–34.0)
MCHC: 33 g/dL (ref 30.0–36.0)
MCV: 106.2 fL — ABNORMAL HIGH (ref 78.0–100.0)
Monocytes Absolute: 0.4 10*3/uL (ref 0.1–1.0)
Monocytes Relative: 9 % (ref 3–12)
NEUTROS PCT: 80 % — AB (ref 43–77)
Neutro Abs: 3.4 10*3/uL (ref 1.7–7.7)
Platelets: 107 10*3/uL — ABNORMAL LOW (ref 150–400)
RBC: 3.37 MIL/uL — AB (ref 4.22–5.81)
RDW: 15.2 % (ref 11.5–15.5)
WBC: 4.2 10*3/uL (ref 4.0–10.5)

## 2014-02-16 LAB — URINE MICROSCOPIC-ADD ON

## 2014-02-16 LAB — URINALYSIS, ROUTINE W REFLEX MICROSCOPIC
Bilirubin Urine: NEGATIVE
Glucose, UA: NEGATIVE mg/dL
Ketones, ur: NEGATIVE mg/dL
Leukocytes, UA: NEGATIVE
Nitrite: NEGATIVE
PROTEIN: 100 mg/dL — AB
Specific Gravity, Urine: 1.014 (ref 1.005–1.030)
Urobilinogen, UA: 0.2 mg/dL (ref 0.0–1.0)
pH: 5.5 (ref 5.0–8.0)

## 2014-02-16 MED ORDER — METRONIDAZOLE 500 MG PO TABS
500.0000 mg | ORAL_TABLET | Freq: Three times a day (TID) | ORAL | Status: DC
  Administered 2014-02-17: 500 mg via ORAL
  Filled 2014-02-16 (×4): qty 1

## 2014-02-16 MED ORDER — VITAMIN D 1000 UNITS PO TABS
5000.0000 [IU] | ORAL_TABLET | Freq: Every morning | ORAL | Status: DC
  Administered 2014-02-17: 5000 [IU] via ORAL
  Filled 2014-02-16: qty 5

## 2014-02-16 MED ORDER — RENA-VITE PO TABS
1.0000 | ORAL_TABLET | Freq: Every day | ORAL | Status: DC
  Administered 2014-02-16: 1 via ORAL
  Filled 2014-02-16 (×2): qty 1

## 2014-02-16 MED ORDER — AMLODIPINE BESYLATE 10 MG PO TABS
10.0000 mg | ORAL_TABLET | Freq: Every day | ORAL | Status: DC
  Administered 2014-02-16: 10 mg via ORAL
  Filled 2014-02-16 (×2): qty 1

## 2014-02-16 MED ORDER — DOXAZOSIN MESYLATE 2 MG PO TABS
2.0000 mg | ORAL_TABLET | Freq: Every day | ORAL | Status: DC
Start: 2014-02-16 — End: 2014-02-17
  Administered 2014-02-16 – 2014-02-17 (×2): 2 mg via ORAL
  Filled 2014-02-16 (×2): qty 1

## 2014-02-16 MED ORDER — PRAVASTATIN SODIUM 40 MG PO TABS
40.0000 mg | ORAL_TABLET | Freq: Every evening | ORAL | Status: DC
  Administered 2014-02-16: 40 mg via ORAL
  Filled 2014-02-16 (×2): qty 1

## 2014-02-16 MED ORDER — ASPIRIN EC 81 MG PO TBEC
81.0000 mg | DELAYED_RELEASE_TABLET | Freq: Every day | ORAL | Status: DC
Start: 2014-02-16 — End: 2014-02-17
  Administered 2014-02-16 – 2014-02-17 (×2): 81 mg via ORAL
  Filled 2014-02-16 (×2): qty 1

## 2014-02-16 MED ORDER — HEPARIN SODIUM (PORCINE) 5000 UNIT/ML IJ SOLN
5000.0000 [IU] | Freq: Three times a day (TID) | INTRAMUSCULAR | Status: DC
  Administered 2014-02-16 – 2014-02-17 (×2): 5000 [IU] via SUBCUTANEOUS
  Filled 2014-02-16 (×4): qty 1

## 2014-02-16 MED ORDER — ONDANSETRON 4 MG PO TBDP
4.0000 mg | ORAL_TABLET | Freq: Once | ORAL | Status: AC
  Administered 2014-02-16: 4 mg via ORAL
  Filled 2014-02-16: qty 1

## 2014-02-16 MED ORDER — SODIUM POLYSTYRENE SULFONATE 15 GM/60ML PO SUSP
15.0000 g | Freq: Once | ORAL | Status: AC
  Administered 2014-02-16: 15 g via ORAL
  Filled 2014-02-16: qty 60

## 2014-02-16 MED ORDER — CARVEDILOL 25 MG PO TABS
25.0000 mg | ORAL_TABLET | Freq: Two times a day (BID) | ORAL | Status: DC
  Administered 2014-02-16: 25 mg via ORAL
  Filled 2014-02-16 (×3): qty 1

## 2014-02-16 MED ORDER — DIPHENHYDRAMINE HCL 25 MG PO TABS
25.0000 mg | ORAL_TABLET | Freq: Two times a day (BID) | ORAL | Status: DC
  Administered 2014-02-16 – 2014-02-17 (×2): 25 mg via ORAL
  Filled 2014-02-16 (×4): qty 1

## 2014-02-16 MED ORDER — IPRATROPIUM-ALBUTEROL 0.5-2.5 (3) MG/3ML IN SOLN: 3.0000 mL | Freq: Four times a day (QID) | RESPIRATORY_TRACT | Status: DC | PRN

## 2014-02-16 MED ORDER — ONDANSETRON 4 MG PO TBDP: 4.0000 mg | ORAL_TABLET | Freq: Three times a day (TID) | ORAL | Status: DC | PRN

## 2014-02-16 MED ORDER — LOPERAMIDE HCL 2 MG PO CAPS
2.0000 mg | ORAL_CAPSULE | Freq: Once | ORAL | Status: AC
  Administered 2014-02-16: 2 mg via ORAL
  Filled 2014-02-16: qty 1

## 2014-02-16 MED ORDER — TRAMADOL HCL 50 MG PO TABS: 50.0000 mg | ORAL_TABLET | Freq: Four times a day (QID) | ORAL | Status: DC | PRN

## 2014-02-16 MED ORDER — METRONIDAZOLE IN NACL 5-0.79 MG/ML-% IV SOLN
500.0000 mg | Freq: Once | INTRAVENOUS | Status: AC
  Administered 2014-02-16: 500 mg via INTRAVENOUS
  Filled 2014-02-16: qty 100

## 2014-02-16 MED ORDER — ACETAMINOPHEN 650 MG RE SUPP: 650.0000 mg | Freq: Four times a day (QID) | RECTAL | Status: DC | PRN

## 2014-02-16 MED ORDER — ACETAMINOPHEN 325 MG PO TABS: 650.0000 mg | ORAL_TABLET | Freq: Four times a day (QID) | ORAL | Status: DC | PRN

## 2014-02-16 MED ORDER — IPRATROPIUM-ALBUTEROL 18-103 MCG/ACT IN AERO: 2.0000 | INHALATION_SPRAY | Freq: Four times a day (QID) | RESPIRATORY_TRACT | Status: DC | PRN

## 2014-02-16 MED ORDER — ONDANSETRON HCL 4 MG PO TABS: 4.0000 mg | ORAL_TABLET | Freq: Three times a day (TID) | ORAL | Status: DC | PRN

## 2014-02-16 MED ORDER — CALCIUM ACETATE 667 MG PO CAPS: 667.0000 mg | ORAL_CAPSULE | ORAL | Status: DC

## 2014-02-16 NOTE — ED Notes (Signed)
Pt reports last night diarrhea q 30 min until 6am then has had 4 stools- watery, yellow, abd pain, bilateral flank pain and weakness.  Pt has dialysis M-W-F, had dialysis yesterday.

## 2014-02-16 NOTE — ED Notes (Addendum)
Pt had another episode of diarrhea after sips of water with meds.  PA made aware of pt condition and will go see pt.

## 2014-02-16 NOTE — ED Provider Notes (Signed)
CSN: [id number]     Arrival date & time 02/16/14  1407 History   First MD Initiated Contact with Patient 02/16/14 1522     Chief Complaint  Patient presents with  . Weakness  . Diarrhea  . Abdominal Pain     (Consider location/radiation/quality/duration/timing/severity/associated sxs/prior Treatment) Patient is a 54 y.o. male presenting with weakness, diarrhea, and abdominal pain. The history is provided by the patient. No language interpreter was used.  Weakness This is a new problem. The current episode started yesterday. The problem occurs constantly. The problem has been gradually worsening. Associated symptoms include abdominal pain, nausea and weakness. Nothing aggravates the symptoms. He has tried nothing for the symptoms. The treatment provided moderate relief.  Diarrhea Associated symptoms: abdominal pain   Abdominal Pain Associated symptoms: diarrhea and nausea     Past Medical History  Diagnosis Date  . Anxiety   . Chronic headaches   . Chronic kidney disease (CKD)   . Dialysis care 02/06/2010  . HTN (hypertension)   . Sleep apnea     wears the CPAP  . Type II or unspecified type diabetes mellitus with renal manifestations, uncontrolled   . Shortness of breath   . Impotence of organic origin   . Other malaise and fatigue   . Debility, unspecified   . Cellulitis and abscess of trunk   . Kidney replaced by transplant   . Acute edema of lung, unspecified   . Unspecified constipation   . Other chronic nonalcoholic liver disease   . Localization-related (focal) (partial) epilepsy and epileptic syndromes with complex partial seizures, without mention of intractable epilepsy   . Tension headache   . Dermatophytosis of the body   . Gout, unspecified   . Pain in joint, upper arm   . Pain in joint, lower leg   . Other nonspecific abnormal serum enzyme levels   . Essential hypertension, benign   . Acute, but ill-defined, cerebrovascular disease   . Memory loss   .  Hypotension, unspecified   . Hypertrophy of prostate without urinary obstruction and other lower urinary tract symptoms (LUTS)   . Renal dialysis status(V45.11)   . End stage renal disease   . Insomnia, unspecified   . Secondary hyperparathyroidism (of renal origin)   . Lumbago   . Unspecified vitamin D deficiency   . Carpal tunnel syndrome   . 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)   . Unspecified hereditary and idiopathic peripheral neuropathy   . Other and unspecified hyperlipidemia   . Unspecified essential hypertension   . Arteriovenous fistula, acquired   . Edema   . Vasectomy status   . Obstructive sleep apnea (adult) (pediatric)    Past Surgical History  Procedure Laterality Date  . 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 Colonoscopy    . Dialysis shunt placement    . Kidney transplant  08/17/2011    [facility]   . Placement of fistula at [facility] vein specialist    . Left heart catheterization with coronary angiogram N/A 03/21/2011    Procedure: LEFT HEART CATHETERIZATION WITH CORONARY ANGIOGRAM;  Surgeon: [name], MD;  Location: [facility] CATH LAB;  Service: Cardiovascular;  Laterality: N/A;   Family History  Problem Relation Age of Onset  . Colon cancer Neg Hx   . Esophageal cancer Neg Hx   . Rectal cancer Neg Hx   . Stomach cancer Neg Hx    History  Substance Use Topics  .  Smoking status: Former Smoker -- 28 years    Types: Cigarettes    Quit date: 08/04/2011  . Smokeless tobacco: Never Used     Comment: 1/2 pack every 4 days  . Alcohol Use: No    Review of Systems  Gastrointestinal: Positive for nausea, abdominal pain and diarrhea.  Neurological: Positive for weakness.  All other systems reviewed and are negative.     Allergies  Codeine  Home Medications   Prior to Admission medications   Medication Sig Start Date End Date Taking? Authorizing Provider  acetaminophen (TYLENOL) 500 MG tablet Take 500 mg by mouth every 4 (four)  hours as needed for mild pain.     Historical Provider, MD  albuterol-ipratropium (COMBIVENT) 18-103 MCG/ACT inhaler Inhale 2 puffs into the lungs every 6 (six) hours as needed for wheezing. 07/08/12   Lauree Chandler, NP  amLODipine (NORVASC) 10 MG tablet Take 10 mg by mouth daily.    Historical Provider, MD  aspirin EC 81 MG tablet Take 81 mg by mouth daily.    Historical Provider, MD  B Complex-C-Folic Acid (NEPHRO-VITE PO) Take by mouth.    Historical Provider, MD  b complex-vitamin c-folic acid (NEPHRO-VITE) 0.8 MG TABS tablet TAKE 1 TABLET BY MOUTH EVERY DAY 11/30/13   Estill Dooms, MD  calcium acetate (PHOSLO) 667 MG capsule Take 667 mg by mouth See admin instructions. 4 by mouth with each meal and 2 by mouth with snacks 04/28/13   Estill Dooms, MD  carvedilol (COREG) 25 MG tablet Take 25 mg by mouth 2 (two) times daily. Take one tablet in the morning and one tablet in the evening 02/16/13   Blanchie Serve, MD  Cholecalciferol 5000 UNITS TABS Take 5,000 Units by mouth every morning.    Historical Provider, MD  diphenhydrAMINE (BENADRYL) 25 MG tablet Take 25 mg by mouth 2 (two) times daily.     Historical Provider, MD  doxazosin (CARDURA) 4 MG tablet Take 2 mg by mouth daily. Take one tablet by mouth once daily to regulate blood pressure 03/23/13   Estill Dooms, MD  ondansetron (ZOFRAN) 4 MG tablet Take 4 mg by mouth every 8 (eight) hours as needed for nausea. 07/23/13   Estill Dooms, MD  pravastatin (PRAVACHOL) 40 MG tablet Take 40 mg by mouth every evening. 02/16/13   Blanchie Serve, MD  traMADol (ULTRAM) 50 MG tablet One up to 4 times daly if needed for pain 12/02/13   Estill Dooms, MD   BP 160/72 mmHg  Pulse 82  Temp(Src) 98.5 F (36.9 C) (Oral)  Resp 16  SpO2 97% Physical Exam  Constitutional: He is oriented to person, place, and time. He appears well-developed and well-nourished.  HENT:  Head: Normocephalic and atraumatic.  Eyes: Conjunctivae and EOM are normal. Pupils are equal,  round, and reactive to light.  Neck: Normal range of motion.  Cardiovascular: Normal rate and normal heart sounds.   Pulmonary/Chest: Effort normal.  Abdominal: Soft. He exhibits no distension.  Musculoskeletal: Normal range of motion.  Neurological: He is alert and oriented to person, place, and time.  Skin: Skin is warm.  Psychiatric: He has a normal mood and affect.  Nursing note and vitals reviewed.   ED Course  Procedures (including critical care time) Labs Review Labs Reviewed  CBC WITH DIFFERENTIAL/PLATELET - Abnormal; Notable for the following:    RBC 3.37 (*)    Hemoglobin 11.8 (*)    HCT 35.8 (*)    MCV  106.2 (*)    MCH 35.0 (*)    Platelets 107 (*)    Neutrophils Relative % 80 (*)    Lymphocytes Relative 9 (*)    Lymphs Abs 0.4 (*)    All other components within normal limits  COMPREHENSIVE METABOLIC PANEL - Abnormal; Notable for the following:    Potassium 5.2 (*)    Glucose, Bld 169 (*)    BUN 49 (*)    Creatinine, Ser 12.47 (*)    GFR calc non Af Amer 4 (*)    GFR calc Af Amer 5 (*)    All other components within normal limits  LIPASE, BLOOD - Abnormal; Notable for the following:    Lipase 80 (*)    All other components within normal limits  URINALYSIS, ROUTINE W REFLEX MICROSCOPIC  I-STAT TROPOININ, ED    Imaging Review No results found.   EKG Interpretation   Date/Time:  Tuesday February 16 2014 15:35:03 EST Ventricular Rate:  76 PR Interval:  163 QRS Duration: 82 QT Interval:  380 QTC Calculation: 427 R Axis:   63 Text Interpretation:  Sinus rhythm Sinus rhythm Artifact Abnormal ekg  Confirmed by Carmin Muskrat  MD [phone]) on 02/16/2014 3:51:05 PM      MDM   Final diagnoses:  Diarrhea  Generalized abdominal pain  Bilateral flank pain  End-stage renal disease on hemodialysis  Type 2 diabetes mellitus with diabetic nephropathy  Anemia in chronic renal disease    Pt's care turned over to [facility].   Pt  admitted    Fransico Meadow, [place] 02/19/14 [place], MD 02/21/14 1415

## 2014-02-16 NOTE — Discharge Instructions (Signed)
Diarrhea °Diarrhea is frequent loose and watery bowel movements. It can cause you to feel weak and dehydrated. Dehydration can cause you to become tired and thirsty, have a dry mouth, and have decreased urination that often is dark yellow. Diarrhea is a sign of another problem, most often an infection that will not last long. In most cases, diarrhea typically lasts 2-3 days. However, it can last longer if it is a sign of something more serious. It is important to treat your diarrhea as directed by your caregiver to lessen or prevent future episodes of diarrhea. °CAUSES  °Some common causes include: °· Gastrointestinal infections caused by viruses, bacteria, or parasites. °· Food poisoning or food allergies. °· Certain medicines, such as antibiotics, chemotherapy, and laxatives. °· Artificial sweeteners and fructose. °· Digestive disorders. °HOME CARE INSTRUCTIONS °· Ensure adequate fluid intake (hydration): Have 1 cup (8 oz) of fluid for each diarrhea episode. Avoid fluids that contain simple sugars or sports drinks, fruit juices, whole milk products, and sodas. Your urine should be clear or pale yellow if you are drinking enough fluids. Hydrate with an oral rehydration solution that you can purchase at pharmacies, retail stores, and online. You can prepare an oral rehydration solution at home by mixing the following ingredients together: °¨  - tsp table salt. °¨ ¾ tsp baking soda. °¨  tsp salt substitute containing potassium chloride. °¨ 1  tablespoons sugar. °¨ 1 L (34 oz) of water. °· Certain foods and beverages may increase the speed at which food moves through the gastrointestinal (GI) tract. These foods and beverages should be avoided and include: °¨ Caffeinated and alcoholic beverages. °¨ High-fiber foods, such as raw fruits and vegetables, nuts, seeds, and whole grain breads and cereals. °¨ Foods and beverages sweetened with sugar alcohols, such as xylitol, sorbitol, and mannitol. °· Some foods may be well  tolerated and may help thicken stool including: °¨ Starchy foods, such as rice, toast, pasta, low-sugar cereal, oatmeal, grits, baked potatoes, crackers, and bagels. °¨ Bananas. °¨ Applesauce. °· Add probiotic-rich foods to help increase healthy bacteria in the GI tract, such as yogurt and fermented milk products. °· Wash your hands well after each diarrhea episode. °· Only take over-the-counter or prescription medicines as directed by your caregiver. °· Take a warm bath to relieve any burning or pain from frequent diarrhea episodes. °SEEK IMMEDIATE MEDICAL CARE IF:  °· You are unable to keep fluids down. °· You have persistent vomiting. °· You have blood in your stool, or your stools are black and tarry. °· You do not urinate in 6-8 hours, or there is only a small amount of very dark urine. °· You have abdominal pain that increases or localizes. °· You have weakness, dizziness, confusion, or light-headedness. °· You have a severe headache. °· Your diarrhea gets worse or does not get better. °· You have a fever or persistent symptoms for more than 2-3 days. °· You have a fever and your symptoms suddenly get worse. °MAKE SURE YOU:  °· Understand these instructions. °· Will watch your condition. °· Will get help right away if you are not doing well or get worse. °Document Released: 12/29/2001 Document Revised: 05/25/2013 Document Reviewed: 09/16/2011 °[employer]® Patient Information ©2015 [employer]. This information is not intended to replace advice given to you by your health care provider. Make sure you discuss any questions you have with your health care provider. ° °

## 2014-02-16 NOTE — ED Notes (Signed)
Pt does not make urine, Sofia PA made aware.

## 2014-02-16 NOTE — ED Provider Notes (Signed)
Care assumed from [facility], PA-C  Kirk [name] is a 54 y.o. male with a history of kidney disease on dialysis, hypertension, diabetes, anemia presents with generalized abdominal pain and intermittent diarrhea beginning last night. He reports diarrhea every 30 minutes throughout the evening and night with persistent diarrhea until his arrival here in the emergency department. He reports stools are watery and yellow. He reports that he has bilateral flank pain which is worse than his abdominal pain. Patient has a history of ESRD on dialysis (Monday, Wednesday, Friday). She reports that he had dialysis yesterday without difficulty. He states he is scheduled for tomorrow.  Patient reports he does not make urine.   Physical Exam  BP 146/67 mmHg  Pulse 74  Temp(Src) 98.5 F (36.9 C) (Oral)  Resp 22  SpO2 97%  Physical Exam   Face to face Exam:   General: Awake  HEENT: Atraumatic  Resp: Normal effort  Abd: Nondistended; generally tender, severe right CVA tenderness  Neuro:No focal weakness  Lymph: No adenopathy  Results for orders placed or performed during the hospital encounter of 02/16/14  CBC with Differential  Result Value Ref Range   WBC 4.2 4.0 - 10.5 K/uL   RBC 3.37 (L) 4.22 - 5.81 MIL/uL   Hemoglobin 11.8 (L) 13.0 - 17.0 g/dL   HCT 35.8 (L) 39.0 - 52.0 %   MCV 106.2 (H) 78.0 - 100.0 fL   MCH 35.0 (H) 26.0 - 34.0 pg   MCHC 33.0 30.0 - 36.0 g/dL   RDW 15.2 11.5 - 15.5 %   Platelets 107 (L) 150 - 400 K/uL   Neutrophils Relative % 80 (H) 43 - 77 %   Neutro Abs 3.4 1.7 - 7.7 K/uL   Lymphocytes Relative 9 (L) 12 - 46 %   Lymphs Abs 0.4 (L) 0.7 - 4.0 K/uL   Monocytes Relative 9 3 - 12 %   Monocytes Absolute 0.4 0.1 - 1.0 K/uL   Eosinophils Relative 2 0 - 5 %   Eosinophils Absolute 0.1 0.0 - 0.7 K/uL   Basophils Relative 1 0 - 1 %   Basophils Absolute 0.0 0.0 - 0.1 K/uL  Comprehensive metabolic panel  Result Value Ref Range   Sodium 138 135 - 145 mmol/L   Potassium  5.2 (H) 3.5 - 5.1 mmol/L   Chloride 102 96 - 112 mmol/L   CO2 21 19 - 32 mmol/L   Glucose, Bld 169 (H) 70 - 99 mg/dL   BUN 49 (H) 6 - 23 mg/dL   Creatinine, Ser 12.47 (H) 0.50 - 1.35 mg/dL   Calcium 9.3 8.4 - 10.5 mg/dL   Total Protein 7.3 6.0 - 8.3 g/dL   Albumin 3.8 3.5 - 5.2 g/dL   AST 29 0 - 37 U/L   ALT 29 0 - 53 U/L   Alkaline Phosphatase 54 39 - 117 U/L   Total Bilirubin 0.6 0.3 - 1.2 mg/dL   GFR calc non Af Amer 4 (L) >90 mL/min   GFR calc Af Amer 5 (L) >90 mL/min   Anion gap 15 5 - 15  Lipase, blood  Result Value Ref Range   Lipase 80 (H) 11 - 59 U/L  I-stat troponin, ED (only if pt is 52 y.o. or older & pain is above umbilicus) - do not order at [facility]  Result Value Ref Range   Troponin i, poc 0.00 0.00 - 0.08 ng/mL   Comment 3  ED Course  Procedures  MDM  1. Diarrhea   2. Generalized abdominal pain   3. Bilateral flank pain   4. End-stage renal disease on hemodialysis   5. Type 2 diabetes mellitus with diabetic nephropathy   6. Anemia in chronic renal disease     Plan: Patient has had multiple episodes of diarrhea here in the emergency department. He was given Imodium. Plan is to by mouth trial and reassess.  6:56 PM Patient continues to have watery diarrhea reports he feels significantly weaker than he has previously today. He reports his abdominal pain is worsening but is located more in his flanks and lessen his abdomen. He reports nausea but is without emesis.  Patient's labs are with elevated creatinine above his baseline in spite of his dialysis yesterday. Potassium is 5.2. Patient also with elevated lipase to 80 but no previous for baseline.  He continues to remain weak with persistent diarrhea. C. difficile cultures sent. Will proceed with admission.   PCP: Jeanmarie Hubert, [facility]  BP 146/67 mmHg  Pulse 74  Temp(Src) 98.5 F (36.9 C) (Oral)  Resp 22  SpO2 97%  7:23 PM Pt discussed with Dr. Maudie Mercury of [facility] who will admit.   Will obtain CT noncontrast abd/pelvis.    Abigail Butts, PA-C 02/16/14 [place], MD 02/16/14 2012

## 2014-02-16 NOTE — H&P (Signed)
Kirk [name] is an 54 y.o. male.    Kirk [name] (pcp)  Chief Complaint: diarrhea HPI: 54yo male with htn, ESRD (M, W, F), s/p renal transplant, OSA apparently c/o diarrhea, starting last nite 7:30pm, apparently having loose stool, but now mostly liquid,  Had about 8 bm today.  Has periumbilical burning pain, and feels like he has flank pain bilateral as well.  Pain is intermittent. Occasional nausea.  Denies fever, emesis, brbpr, black stool.  Pt presented to the ED and found to be hyperkalemic.  Pt has had c. Diff in the past.  Last colonoscopy 06/27/2011=> tubular adenoma.  Pt will be admitted for diarrhea, hyperkalemia.   Past Medical History  Diagnosis Date  . Anxiety   . Chronic headaches   . Chronic kidney disease (CKD)   . Dialysis care 02/06/2010  . HTN (hypertension)   . Sleep apnea     wears the CPAP  . Type II or unspecified type diabetes mellitus with renal manifestations, uncontrolled   . Shortness of breath   . Impotence of organic origin   . Other malaise and fatigue   . Debility, unspecified   . Cellulitis and abscess of trunk   . Kidney replaced by transplant   . Acute edema of lung, unspecified   . Unspecified constipation   . Other chronic nonalcoholic liver disease   . Localization-related (focal) (partial) epilepsy and epileptic syndromes with complex partial seizures, without mention of intractable epilepsy   . Tension headache   . Dermatophytosis of the body   . Gout, unspecified   . Pain in joint, upper arm   . Pain in joint, lower leg   . Other nonspecific abnormal serum enzyme levels   . Essential hypertension, benign   . Acute, but ill-defined, cerebrovascular disease   . Memory loss   . Hypotension, unspecified   . Hypertrophy of prostate without urinary obstruction and other lower urinary tract symptoms (LUTS)   . Renal dialysis status(V45.11)   . End stage renal disease   . Insomnia, unspecified   . Secondary hyperparathyroidism (of renal origin)    . Lumbago   . Unspecified vitamin D deficiency   . Carpal tunnel syndrome   . 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)   . Unspecified hereditary and idiopathic peripheral neuropathy   . Other and unspecified hyperlipidemia   . Unspecified essential hypertension   . Arteriovenous fistula, acquired   . Edema   . Vasectomy status   . Obstructive sleep apnea (adult) (pediatric)     Past Surgical History  Procedure Laterality Date  . Inner ear surgery  1973    for deafness at age 60  . Colonoscopy    . Dialysis shunt placement    . Kidney transplant  08/17/2011    [facility]   . Placement of fistula at [facility] vein specialist    . Left heart catheterization with coronary angiogram N/A 03/21/2011    Procedure: LEFT HEART CATHETERIZATION WITH CORONARY ANGIOGRAM;  Surgeon: [name], MD;  Location: [facility] CATH LAB;  Service: Cardiovascular;  Laterality: N/A;    Family History  Problem Relation Age of Onset  . Adopted: Yes  . Colon cancer Neg Hx   . Esophageal cancer Neg Hx   . Rectal cancer Neg Hx   . Stomach cancer Neg Hx    Social History:  reports that he quit smoking about 2 years ago. His smoking use included Cigarettes. He quit after 28 years of use. He has  never used smokeless tobacco. He reports that he does not drink alcohol or use illicit drugs.  Allergies:  Allergies  Allergen Reactions  . Codeine Nausea And Vomiting and Nausea Only     (Not in a hospital admission)  Results for orders placed or performed during the hospital encounter of 02/16/14 (from the past 48 hour(s))  CBC with Differential     Status: Abnormal   Collection Time: 02/16/14  2:17 PM  Result Value Ref Range   WBC 4.2 4.0 - 10.5 K/uL   RBC 3.37 (L) 4.22 - 5.81 MIL/uL   Hemoglobin 11.8 (L) 13.0 - 17.0 g/dL    Comment: REPEATED TO VERIFY   HCT 35.8 (L) 39.0 - 52.0 %   MCV 106.2 (H) 78.0 - 100.0 fL    Comment: REPEATED TO VERIFY   MCH 35.0 (H) 26.0 - 34.0 pg   MCHC 33.0 30.0 - 36.0  g/dL   RDW 15.2 11.5 - 15.5 %   Platelets 107 (L) 150 - 400 K/uL    Comment: PLATELET COUNT CONFIRMED BY SMEAR   Neutrophils Relative % 80 (H) 43 - 77 %   Neutro Abs 3.4 1.7 - 7.7 K/uL   Lymphocytes Relative 9 (L) 12 - 46 %   Lymphs Abs 0.4 (L) 0.7 - 4.0 K/uL   Monocytes Relative 9 3 - 12 %   Monocytes Absolute 0.4 0.1 - 1.0 K/uL   Eosinophils Relative 2 0 - 5 %   Eosinophils Absolute 0.1 0.0 - 0.7 K/uL   Basophils Relative 1 0 - 1 %   Basophils Absolute 0.0 0.0 - 0.1 K/uL  Comprehensive metabolic panel     Status: Abnormal   Collection Time: 02/16/14  2:17 PM  Result Value Ref Range   Sodium 138 135 - 145 mmol/L   Potassium 5.2 (H) 3.5 - 5.1 mmol/L   Chloride 102 96 - 112 mmol/L   CO2 21 19 - 32 mmol/L   Glucose, Bld 169 (H) 70 - 99 mg/dL   BUN 49 (H) 6 - 23 mg/dL   Creatinine, Ser 12.47 (H) 0.50 - 1.35 mg/dL   Calcium 9.3 8.4 - 10.5 mg/dL   Total Protein 7.3 6.0 - 8.3 g/dL   Albumin 3.8 3.5 - 5.2 g/dL   AST 29 0 - 37 U/L   ALT 29 0 - 53 U/L   Alkaline Phosphatase 54 39 - 117 U/L   Total Bilirubin 0.6 0.3 - 1.2 mg/dL   GFR calc non Af Amer 4 (L) >90 mL/min   GFR calc Af Amer 5 (L) >90 mL/min    Comment: (NOTE) The eGFR has been calculated using the CKD EPI equation. This calculation has not been validated in all clinical situations. eGFR's persistently <90 mL/min signify possible Chronic Kidney Disease.    Anion gap 15 5 - 15  Lipase, blood     Status: Abnormal   Collection Time: 02/16/14  2:17 PM  Result Value Ref Range   Lipase 80 (H) 11 - 59 U/L  I-stat troponin, ED (only if pt is 51 y.o. or older & pain is above umbilicus) - do not order at [facility]     Status: None   Collection Time: 02/16/14  2:28 PM  Result Value Ref Range   Troponin i, poc 0.00 0.00 - 0.08 ng/mL   Comment 3            Comment: Due to the release kinetics of cTnI, a negative result within the first hours of  the onset of symptoms does not rule out myocardial infarction with certainty. If  myocardial infarction is still suspected, repeat the test at appropriate intervals.    No results found.  Review of Systems  Constitutional: Positive for chills. Negative for fever, weight loss, malaise/fatigue and diaphoresis.  HENT: Negative for congestion, ear discharge, ear pain, hearing loss, nosebleeds, sore throat and tinnitus.   Eyes: Negative for blurred vision, double vision, photophobia, pain, discharge and redness.  Respiratory: Negative for cough, hemoptysis, sputum production, shortness of breath, wheezing and stridor.   Cardiovascular: Negative for chest pain, palpitations, orthopnea, claudication, leg swelling and PND.  Gastrointestinal: Positive for nausea and diarrhea. Negative for heartburn, vomiting, abdominal pain, constipation, blood in stool and melena.  Genitourinary: Positive for flank pain. Negative for dysuria, urgency, frequency and hematuria.  Musculoskeletal: Negative for myalgias, back pain, joint pain, falls and neck pain.  Skin: Negative for itching and rash.  Neurological: Negative for dizziness, tingling, tremors, sensory change, speech change, focal weakness, seizures, loss of consciousness, weakness and headaches.  Endo/Heme/Allergies: Negative for environmental allergies and polydipsia. Does not bruise/bleed easily.  Psychiatric/Behavioral: Negative for depression, suicidal ideas, hallucinations, memory loss and substance abuse. The patient is not nervous/anxious and does not have insomnia.     Blood pressure 146/67, pulse 74, temperature 98.5 F (36.9 C), temperature source Oral, resp. rate 22, SpO2 97 %. Physical Exam  Constitutional: He is oriented to person, place, and time. He appears well-developed and well-nourished.  HENT:  Head: Normocephalic and atraumatic.  Eyes: Conjunctivae and EOM are normal. Pupils are equal, round, and reactive to light.  Neck: Normal range of motion. Neck supple. No JVD present. No tracheal deviation present. No  thyromegaly present.  Cardiovascular: Normal rate and regular rhythm.  Exam reveals no gallop and no friction rub.   No murmur heard. Respiratory: Effort normal and breath sounds normal.  GI: Soft. Bowel sounds are normal. He exhibits no distension. There is no tenderness. There is no rebound and no guarding.  obese  Musculoskeletal: Normal range of motion. He exhibits no edema or tenderness.  Lymphadenopathy:    He has no cervical adenopathy.  Neurological: He is alert and oriented to person, place, and time. He has normal reflexes. He displays normal reflexes. No cranial nerve deficit. He exhibits normal muscle tone. Coordination normal.  Skin: Skin is warm and dry. No rash noted. No erythema. No pallor.  Psychiatric: He has a normal mood and affect. His behavior is normal. Judgment and thought content normal.     Assessment/Plan #1 Diarrhea Check stool for fecal leukocytes, culture, and c. Diff,  Contact precautions  #2  Abdominal pain With mild lipase elevation Check CT scan abd/pelvis  #3  Hyperkalemia Kayexalate 15 gm po x1 Check cmp in am  #4 ESRD on HD (M, W, F) Please contact nephrology for dialysis tomorrow Check cmp in am  #5 Anemia Check cbc in am  #6 Thrombocytopenia Check cbc in am  Jani Gravel 02/16/2014, 7:47 PM

## 2014-02-16 NOTE — ED Notes (Signed)
Pt unable to urinate at this time.  

## 2014-02-17 ENCOUNTER — Observation Stay (HOSPITAL_COMMUNITY): Payer: Medicare Other

## 2014-02-17 ENCOUNTER — Encounter (HOSPITAL_COMMUNITY): Payer: Self-pay | Admitting: Radiology

## 2014-02-17 DIAGNOSIS — D631 Anemia in chronic kidney disease: Secondary | ICD-10-CM | POA: Diagnosis not present

## 2014-02-17 DIAGNOSIS — N189 Chronic kidney disease, unspecified: Secondary | ICD-10-CM | POA: Diagnosis not present

## 2014-02-17 DIAGNOSIS — R197 Diarrhea, unspecified: Secondary | ICD-10-CM | POA: Diagnosis not present

## 2014-02-17 DIAGNOSIS — R1084 Generalized abdominal pain: Secondary | ICD-10-CM | POA: Diagnosis not present

## 2014-02-17 DIAGNOSIS — R109 Unspecified abdominal pain: Secondary | ICD-10-CM | POA: Insufficient documentation

## 2014-02-17 DIAGNOSIS — I10 Essential (primary) hypertension: Secondary | ICD-10-CM | POA: Diagnosis not present

## 2014-02-17 DIAGNOSIS — Z992 Dependence on renal dialysis: Secondary | ICD-10-CM | POA: Diagnosis not present

## 2014-02-17 DIAGNOSIS — K8020 Calculus of gallbladder without cholecystitis without obstruction: Secondary | ICD-10-CM | POA: Diagnosis not present

## 2014-02-17 DIAGNOSIS — E875 Hyperkalemia: Secondary | ICD-10-CM | POA: Diagnosis not present

## 2014-02-17 DIAGNOSIS — N261 Atrophy of kidney (terminal): Secondary | ICD-10-CM | POA: Diagnosis not present

## 2014-02-17 DIAGNOSIS — N186 End stage renal disease: Secondary | ICD-10-CM | POA: Diagnosis not present

## 2014-02-17 LAB — CBC WITH DIFFERENTIAL/PLATELET
BASOS ABS: 0 10*3/uL (ref 0.0–0.1)
Basophils Relative: 1 % (ref 0–1)
EOS PCT: 8 % — AB (ref 0–5)
Eosinophils Absolute: 0.2 10*3/uL (ref 0.0–0.7)
HCT: 33.8 % — ABNORMAL LOW (ref 39.0–52.0)
Hemoglobin: 11 g/dL — ABNORMAL LOW (ref 13.0–17.0)
Lymphocytes Relative: 32 % (ref 12–46)
Lymphs Abs: 0.8 10*3/uL (ref 0.7–4.0)
MCH: 34.1 pg — ABNORMAL HIGH (ref 26.0–34.0)
MCHC: 32.5 g/dL (ref 30.0–36.0)
MCV: 104.6 fL — ABNORMAL HIGH (ref 78.0–100.0)
MONO ABS: 0.4 10*3/uL (ref 0.1–1.0)
Monocytes Relative: 18 % — ABNORMAL HIGH (ref 3–12)
NEUTROS PCT: 41 % — AB (ref 43–77)
Neutro Abs: 1 10*3/uL — ABNORMAL LOW (ref 1.7–7.7)
PLATELETS: 102 10*3/uL — AB (ref 150–400)
RBC: 3.23 MIL/uL — AB (ref 4.22–5.81)
RDW: 14.7 % (ref 11.5–15.5)
WBC: 2.4 10*3/uL — ABNORMAL LOW (ref 4.0–10.5)

## 2014-02-17 LAB — FECAL LACTOFERRIN, QUANT: FECAL LACTOFERRIN: NEGATIVE

## 2014-02-17 LAB — COMPREHENSIVE METABOLIC PANEL
ALK PHOS: 47 U/L (ref 39–117)
ALT: 36 U/L (ref 0–53)
ANION GAP: 14 (ref 5–15)
AST: 34 U/L (ref 0–37)
Albumin: 3.5 g/dL (ref 3.5–5.2)
BILIRUBIN TOTAL: 0.7 mg/dL (ref 0.3–1.2)
BUN: 58 mg/dL — ABNORMAL HIGH (ref 6–23)
CALCIUM: 8.6 mg/dL (ref 8.4–10.5)
CO2: 20 mmol/L (ref 19–32)
CREATININE: 14.32 mg/dL — AB (ref 0.50–1.35)
Chloride: 100 mmol/L (ref 96–112)
GFR calc Af Amer: 4 mL/min — ABNORMAL LOW (ref >90)
GFR calc non Af Amer: 3 mL/min — ABNORMAL LOW (ref >90)
Glucose, Bld: 100 mg/dL — ABNORMAL HIGH (ref 70–99)
Potassium: 5.2 mmol/L — ABNORMAL HIGH (ref 3.5–5.1)
SODIUM: 134 mmol/L — AB (ref 135–145)
TOTAL PROTEIN: 7 g/dL (ref 6.0–8.3)

## 2014-02-17 LAB — CLOSTRIDIUM DIFFICILE BY PCR: Toxigenic C. Difficile by PCR: NEGATIVE

## 2014-02-17 MED ORDER — SACCHAROMYCES BOULARDII 250 MG PO CAPS: 250.0000 mg | ORAL_CAPSULE | Freq: Two times a day (BID) | ORAL | Status: DC

## 2014-02-17 MED ORDER — ONDANSETRON 4 MG PO TBDP: 4.0000 mg | ORAL_TABLET | Freq: Three times a day (TID) | ORAL | Status: DC | PRN

## 2014-02-17 NOTE — Discharge Summary (Signed)
Physician Discharge Summary  Kirk [name] [id number] DOB: 04/23/1960 DOA: 02/16/2014  PCP: Estill Dooms, MD  Admit date: 02/16/2014 Discharge date: 02/17/2014  Time spent: 30 minutes  Recommendations for Outpatient Follow-up:  Repeat BMET to follow electrolytes Ensure resolution of diarrhea  Discharge Diagnoses:  Diarrhea Essential HTN (hypertension) End-stage renal disease on hemodialysis Mild Hyperkalemia HLD Hyperparathyroidism    Discharge Condition: stable. Still with some loose stools; but denies abd pain, nausea and vomiting. Will follow with PCP in 10 days.   Diet recommendation: renal diet   Filed Weights   02/16/14 2101 02/17/14 0559  Weight: 105.7 kg (233 lb 0.4 oz) 105.7 kg (233 lb 0.4 oz)    History of present illness:  54yo male with htn, ESRD (M, W, F), s/p renal transplant and OSA; presented to ED with complaints of diarrhea, starting approx 24 hours prior to admission; patient denies fever, nausea, vomiting, melena, hematochezia or any other complaints. apparently having loose stool, but now mostly liquid, Had about 8 bm today. Has periumbilical burning pain, and feels like he has flank pain bilateral as well. Pain is intermittent. Pt in the ED was found to have mild hyperkalemia and was referred to admission for further evaluation and treatment. Of note, had hx of C. Diff in the past.   Hospital Course:  1-abd pain and diarrhea: no fever, no nausea, no vomiting and no elevation of WBC's -work up demonstrated CT abd/pelvis w/o acute abnormalities  -C. Diff neg -Most likely viral gastroenteritis -patient advise to keep himself well hydrated and started on florastor -follow up with PCP in 7-10 days  2-ESRD: plan is to continue HD M-W-F -patient will have treatment on his HD uniti at discharge on 02-17-14  3-essential HTN: stable continue current antihypertensive regimen  4-hyperkalemia: mild elevation of potassium prior to HD -no abnormalities  seen on EKG or telemetry -electrolyte to be adjusted on HD treatment -will follow BMET during follow up visit  5-HLD: continue statins  6-hyperparathyroidism: continue phoslo   Procedures: See below for x-ray reports   Consultations:  Renal   Discharge Exam: Filed Vitals:   02/17/14 1000  BP: 130/59  Pulse:   Temp:   Resp:     General: afebrile, in NAD; denies nausea, vomiting, CP and SOB Cardiovascular: S1 and S2, no rubs or gallops Respiratory: CTA bilaterally Abd: soft, no guarding; mild discomfort (generalized) with deep palpation; positive BS Extremities: no edema or cyanosis   Discharge Instructions   Discharge Instructions    Diet - low sodium heart healthy    Complete by:  As directed      Discharge instructions    Complete by:  As directed   Follow with Hemodialysis unit for treatment this afternoon Keep yourself well hydrated Arrange follow up with PCP in 10 days  Take medications as prescribed          Current Discharge Medication List    START taking these medications   Details  ondansetron (ZOFRAN ODT) 4 MG disintegrating tablet Take 1 tablet (4 mg total) by mouth every 8 (eight) hours as needed for nausea or vomiting. Qty: 20 tablet, Refills: 0    saccharomyces boulardii (FLORASTOR) 250 MG capsule Take 1 capsule (250 mg total) by mouth 2 (two) times daily. Qty: 60 capsule, Refills: 0      CONTINUE these medications which have NOT CHANGED   Details  acetaminophen (TYLENOL) 500 MG tablet Take 500 mg by mouth every 4 (four) hours as needed for  mild pain.     albuterol-ipratropium (COMBIVENT) 18-103 MCG/ACT inhaler Inhale 2 puffs into the lungs every 6 (six) hours as needed for wheezing. Qty: 14.7 g, Refills: 3    amLODipine (NORVASC) 10 MG tablet Take 10 mg by mouth daily.    aspirin EC 81 MG tablet Take 81 mg by mouth daily.    !! B Complex-C-Folic Acid (NEPHRO-VITE PO) Take by mouth.    !! b complex-vitamin c-folic acid (NEPHRO-VITE)  0.8 MG TABS tablet TAKE 1 TABLET BY MOUTH EVERY DAY Qty: 30 tablet, Refills: 5    calcium acetate (PHOSLO) 667 MG capsule Take 667 mg by mouth See admin instructions. 4 by mouth with each meal and 2 by mouth with snacks    carvedilol (COREG) 25 MG tablet Take 25 mg by mouth 2 (two) times daily. Take one tablet in the morning and one tablet in the evening    Cholecalciferol 5000 UNITS TABS Take 5,000 Units by mouth every morning.    diphenhydrAMINE (BENADRYL) 25 MG tablet Take 25 mg by mouth 2 (two) times daily.     doxazosin (CARDURA) 4 MG tablet Take 2 mg by mouth daily. Take one tablet by mouth once daily to regulate blood pressure    pravastatin (PRAVACHOL) 40 MG tablet Take 40 mg by mouth every evening.    traMADol (ULTRAM) 50 MG tablet One up to 4 times daly if needed for pain Qty: 100 tablet, Refills: 3   Associated Diagnoses: Tension headache     !! - Potential duplicate medications found. Please discuss with provider.    STOP taking these medications     ondansetron (ZOFRAN) 4 MG tablet        Allergies  Allergen Reactions  . Codeine Nausea And Vomiting and Nausea Only   Follow-up Information    Follow up with GREEN, [name], MD. Schedule an appointment as soon as possible for a visit in 10 days.   Specialty:  Internal Medicine   Contact information:   [place] [address] [phone]        The results of significant diagnostics from this hospitalization (including imaging, microbiology, ancillary and laboratory) are listed below for reference.    Significant Diagnostic Studies: Ct Abdomen Pelvis Wo Contrast  02/17/2014   CLINICAL DATA:  Umbilical area abdominal pain. Nausea, diarrhea, and weakness for 2 days.  EXAM: CT ABDOMEN AND PELVIS WITHOUT CONTRAST  TECHNIQUE: Multidetector CT imaging of the abdomen and pelvis was performed following the standard protocol without IV contrast.  COMPARISON:  09/08/2013  FINDINGS: Calcified granulomas  and focal scarring in the lung bases is similar to prior study.  Cholelithiasis. No gallbladder wall thickening or edema. No bile duct dilatation. Spleen is enlarged. Unenhanced appearance of the liver, pancreas, adrenal glands, abdominal aorta, and inferior vena cava is unremarkable. Bilateral renal atrophy with medullary calcifications in both kidneys. No hydronephrosis. Stomach appears grossly normal although under distended. Contrast material flows through to the colon and rectum without evidence of obstruction. Visualized small bowel and colon are unremarkable. No free air or free fluid in the abdomen. Scarring in the right anterior abdominal wall is likely postoperative.  Pelvis: The appendix is normal. Right pelvic transplant kidney is significantly smaller than on prior study suggesting atrophy. No hydronephrosis. Bladder wall is not thickened. Mild prostate enlargement. No free or loculated pelvic fluid collections. No pelvic mass or lymphadenopathy. Degenerative changes in the lumbar spine. No destructive bone lesions.  IMPRESSION: Cholelithiasis. Swallow large min.  Bilateral renal atrophy and calcifications without hydronephrosis. Right pelvic transplant kidney demonstrating significant decrease in size since previous study. No hydronephrosis.   Electronically Signed   By: Lucienne Capers M.D.   On: 02/17/2014 01:23    Microbiology: Recent Results (from the past 240 hour(s))  Clostridium Difficile by PCR     Status: None   Collection Time: 02/16/14  9:43 PM  Result Value Ref Range Status   C difficile by pcr NEGATIVE NEGATIVE Final  Stool culture     Status: None (Preliminary result)   Collection Time: 02/16/14  9:44 PM  Result Value Ref Range Status   Specimen Description STOOL  Final   Special Requests NONE  Final   Culture   Final    Culture reincubated for better growth Performed at [facility]    Report Status PENDING  Incomplete     Labs: Basic Metabolic  Panel:  Recent Labs Lab 02/16/14 1417 02/17/14 0645  NA 138 134*  K 5.2* 5.2*  CL 102 100  CO2 21 20  GLUCOSE 169* 100*  BUN 49* 58*  CREATININE 12.47* 14.32*  CALCIUM 9.3 8.6   Liver Function Tests:  Recent Labs Lab 02/16/14 1417 02/17/14 0645  AST 29 34  ALT 29 36  ALKPHOS 54 47  BILITOT 0.6 0.7  PROT 7.3 7.0  ALBUMIN 3.8 3.5    Recent Labs Lab 02/16/14 1417  LIPASE 80*   CBC:  Recent Labs Lab 02/16/14 1417 02/17/14 0645  WBC 4.2 2.4*  NEUTROABS 3.4 1.0*  HGB 11.8* 11.0*  HCT 35.8* 33.8*  MCV 106.2* 104.6*  PLT 107* 102*    Signed:  Barton Dubois  [facility] 02/17/2014, 11:37 AM

## 2014-02-17 NOTE — Progress Notes (Signed)
Patient discharge teaching given, including activity, diet, follow-up appoints, and medications. Patient verbalized understanding of all discharge instructions. IV access was d/c'd. Vitals are stable. Skin is intact except as charted in most recent assessments. Pt to be escorted out by NT, to be driven home by family. 

## 2014-02-17 NOTE — Progress Notes (Signed)
Kirk [name] [id number] Code Status: Full    Admission Data: 02/17/2014 12:36 AM Attending Provider:  Dr. Maudie Mercury [id number], [name], MD Consults/ Treatment Team:    Kirk [name] is a 54 y.o. male patient admitted from ED awake, alert - oriented  X 3 - no acute distress noted.  VSS - Blood pressure 157/83, pulse 76, temperature 98.9 F (37.2 C), temperature source Oral, resp. rate 18, height 5' 7.5" (1.715 m), weight 105.7 kg (233 lb 0.4 oz), SpO2 99 %.    IV in place, occlusive dsg intact without redness.  Orientation to room, and floor completed with information packet given to patient/family.  Patient declined safety video at this time.  Admission INP armband ID verified with patient/family, and in place.   SR up x 2, fall assessment complete, with patient and family able to verbalize understanding of risk associated with falls, and verbalized understanding to call nsg before up out of bed.  Call light within reach, patient able to voice, and demonstrate understanding.  Skin, clean-dry- intact without evidence of bruising, or skin tears.   No evidence of skin break down noted on exam.     Will cont to eval and treat per MD orders.  Vertell Limber, RN 02/17/2014 12:36 AM

## 2014-02-17 NOTE — Care Management Note (Signed)
    Page 1 of 1   02/17/2014     2:26:43 PM CARE MANAGEMENT NOTE 02/17/2014  Patient:  Kirk [name], Kirk [name]   Account Number:  [id number]  Date Initiated:  02/17/2014  Documentation initiated by:  Tomi Bamberger  Subjective/Objective Assessment:   dx diarrhea , abd pain  admit- lives with family.     Action/Plan:   Anticipated DC Date:  02/17/2014   Anticipated DC Plan:  [place]  CM consult      Choice offered to / List presented to:             Status of service:  Completed, signed off Medicare Important Message given?  NA - LOS <3 / Initial given by admissions (If response is "NO", the following Medicare IM given date fields will be blank) Date Medicare IM given:   Medicare IM given by:   Date Additional Medicare IM given:   Additional Medicare IM given by:    Discharge Disposition:  HOME/SELF CARE  Per UR Regulation:  Reviewed for med. necessity/level of care/duration of stay  If discussed at [place] of Stay Meetings, dates discussed:    Comments:  02/17/14 [place], BSN [phone] patient dc to home, no needs anticipated.

## 2014-02-19 DIAGNOSIS — D631 Anemia in chronic kidney disease: Secondary | ICD-10-CM | POA: Diagnosis not present

## 2014-02-19 DIAGNOSIS — N186 End stage renal disease: Secondary | ICD-10-CM | POA: Diagnosis not present

## 2014-02-19 LAB — GI PATHOGEN PANEL BY PCR, STOOL
C difficile toxin A/B: NOT DETECTED
Campylobacter by PCR: NOT DETECTED
Cryptosporidium by PCR: NOT DETECTED
E coli (ETEC) LT/ST: NOT DETECTED
E coli (STEC): NOT DETECTED
E coli 0157 by PCR: NOT DETECTED
G LAMBLIA BY PCR: NOT DETECTED
Rotavirus A by PCR: NOT DETECTED
Salmonella by PCR: NOT DETECTED
Shigella by PCR: NOT DETECTED

## 2014-02-20 LAB — STOOL CULTURE

## 2014-02-21 DIAGNOSIS — Z992 Dependence on renal dialysis: Secondary | ICD-10-CM | POA: Diagnosis not present

## 2014-02-21 DIAGNOSIS — N186 End stage renal disease: Secondary | ICD-10-CM | POA: Diagnosis not present

## 2014-02-22 DIAGNOSIS — D631 Anemia in chronic kidney disease: Secondary | ICD-10-CM | POA: Diagnosis not present

## 2014-02-22 DIAGNOSIS — N186 End stage renal disease: Secondary | ICD-10-CM | POA: Diagnosis not present

## 2014-02-24 DIAGNOSIS — N186 End stage renal disease: Secondary | ICD-10-CM | POA: Diagnosis not present

## 2014-02-24 DIAGNOSIS — D631 Anemia in chronic kidney disease: Secondary | ICD-10-CM | POA: Diagnosis not present

## 2014-02-26 DIAGNOSIS — D631 Anemia in chronic kidney disease: Secondary | ICD-10-CM | POA: Diagnosis not present

## 2014-02-26 DIAGNOSIS — N186 End stage renal disease: Secondary | ICD-10-CM | POA: Diagnosis not present

## 2014-03-01 DIAGNOSIS — N186 End stage renal disease: Secondary | ICD-10-CM | POA: Diagnosis not present

## 2014-03-01 DIAGNOSIS — D631 Anemia in chronic kidney disease: Secondary | ICD-10-CM | POA: Diagnosis not present

## 2014-03-03 ENCOUNTER — Ambulatory Visit: Payer: Medicare Other | Admitting: Internal Medicine

## 2014-03-03 DIAGNOSIS — D631 Anemia in chronic kidney disease: Secondary | ICD-10-CM | POA: Diagnosis not present

## 2014-03-03 DIAGNOSIS — N186 End stage renal disease: Secondary | ICD-10-CM | POA: Diagnosis not present

## 2014-03-05 ENCOUNTER — Encounter: Payer: Self-pay | Admitting: Gastroenterology

## 2014-03-05 ENCOUNTER — Ambulatory Visit (INDEPENDENT_AMBULATORY_CARE_PROVIDER_SITE_OTHER): Payer: Medicare Other | Admitting: Gastroenterology

## 2014-03-05 VITALS — BP 160/70 | HR 76 | Ht 67.0 in | Wt 231.5 lb

## 2014-03-05 DIAGNOSIS — D631 Anemia in chronic kidney disease: Secondary | ICD-10-CM | POA: Diagnosis not present

## 2014-03-05 DIAGNOSIS — N186 End stage renal disease: Secondary | ICD-10-CM | POA: Diagnosis not present

## 2014-03-05 DIAGNOSIS — K625 Hemorrhage of anus and rectum: Secondary | ICD-10-CM | POA: Diagnosis not present

## 2014-03-05 NOTE — Progress Notes (Signed)
Review of pertinent gastrointestinal problems: 1. Personal history of tubular adenomas, colonoscopy May 2009. Recommended to have repeat colonoscopy at 3 year interval. A reminder letter was sent in 2012.  Colonoscopy 06/2011 Dr. Ardis Hughs, two subcentimeter polyps removed, were adenomatous.  Recommended recall at 5 year interval. 2. new anemia prompted EGD October 2009. Mild gastritis, duodenitis H. pylori positive on biopsy.   HPI: This is a   very pleasant 54 year old man who is here with his wife and daughter today. I last saw him about 2 and half years ago the time of upper and lower endoscopy. See those results summarized above.  Cramping in abdomen.  Better recently.  Can be intermittent.  Has seen blood on tissue paper.  Just at time of BMs.  Hb 01/2014 was 11.  ESRD on HD  Was admitted for 24 hours 2 weeks ago with acute, non-bloody diarrhea. GI pathogen panel neg, fecal leuks neg, he was felt to have had a viral GE, was recommended to try probiotics, stay hydrated.  CT scan 01/2014 done for abd pains shows gallstones, otherwise fairly unrevealing.      Past Medical History  Diagnosis Date  . Anxiety   . Chronic headaches   . Chronic kidney disease (CKD)   . Dialysis care 02/06/2010  . HTN (hypertension)   . Sleep apnea     wears the CPAP  . Type II or unspecified type diabetes mellitus with renal manifestations, uncontrolled   . Shortness of breath   . Impotence of organic origin   . Other malaise and fatigue   . Debility, unspecified   . Cellulitis and abscess of trunk   . Kidney replaced by transplant   . Acute edema of lung, unspecified   . Unspecified constipation   . Other chronic nonalcoholic liver disease   . Localization-related (focal) (partial) epilepsy and epileptic syndromes with complex partial seizures, without mention of intractable epilepsy   . Tension headache   . Dermatophytosis of the body   . Gout, unspecified   . Pain in joint, upper arm   .  Pain in joint, lower leg   . Other nonspecific abnormal serum enzyme levels   . Essential hypertension, benign   . Acute, but ill-defined, cerebrovascular disease   . Memory loss   . Hypotension, unspecified   . Hypertrophy of prostate without urinary obstruction and other lower urinary tract symptoms (LUTS)   . Renal dialysis status(V45.11)   . End stage renal disease   . Insomnia, unspecified   . Secondary hyperparathyroidism (of renal origin)   . Lumbago   . Unspecified vitamin D deficiency   . Carpal tunnel syndrome   . 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)   . Unspecified hereditary and idiopathic peripheral neuropathy   . Other and unspecified hyperlipidemia   . Unspecified essential hypertension   . Arteriovenous fistula, acquired   . Edema   . Vasectomy status   . Obstructive sleep apnea (adult) (pediatric)     Past Surgical History  Procedure Laterality Date  . Inner ear surgery  1973    for deafness at age 80  . Colonoscopy    . Dialysis shunt placement    . Kidney transplant  08/17/2011    [facility]   . Placement of fistula at [facility] vein specialist    . Left heart catheterization with coronary angiogram N/A 03/21/2011    Procedure: LEFT HEART CATHETERIZATION WITH CORONARY ANGIOGRAM;  Surgeon: [name], MD;  Location: [facility] CATH LAB;  Service: Cardiovascular;  Laterality: N/A;  . Cervical disc surgery      for spinal stenosis  . Nephrectomy      removed transplaned kidney    Current Outpatient Prescriptions  Medication Sig Dispense Refill  . acetaminophen (TYLENOL) 500 MG tablet Take 500 mg by mouth every 4 (four) hours as needed for mild pain.     Marland Kitchen albuterol-ipratropium (COMBIVENT) 18-103 MCG/ACT inhaler Inhale 2 puffs into the lungs every 6 (six) hours as needed for wheezing. 14.7 g 3  . amLODipine (NORVASC) 10 MG tablet Take 10 mg by mouth daily.    Marland Kitchen aspirin EC 81 MG tablet Take 81 mg by mouth daily.    . B Complex-C-Folic Acid (NEPHRO-VITE  PO) Take by mouth.    Marland Kitchen b complex-vitamin c-folic acid (NEPHRO-VITE) 0.8 MG TABS tablet TAKE 1 TABLET BY MOUTH EVERY DAY 30 tablet 5  . calcium acetate (PHOSLO) 667 MG capsule Take 667 mg by mouth See admin instructions. 4 by mouth with each meal and 2 by mouth with snacks    . carvedilol (COREG) 25 MG tablet Take 25 mg by mouth 2 (two) times daily. Take one tablet in the morning and one tablet in the evening    . Cholecalciferol 5000 UNITS TABS Take 5,000 Units by mouth every morning.    . diphenhydrAMINE (BENADRYL) 25 MG tablet Take 25 mg by mouth 2 (two) times daily.     Marland Kitchen doxazosin (CARDURA) 4 MG tablet Take 2 mg by mouth daily. Take one tablet by mouth once daily to regulate blood pressure    . ondansetron (ZOFRAN ODT) 4 MG disintegrating tablet Take 1 tablet (4 mg total) by mouth every 8 (eight) hours as needed for nausea or vomiting. 20 tablet 0  . pravastatin (PRAVACHOL) 40 MG tablet Take 40 mg by mouth every evening.    . traMADol (ULTRAM) 50 MG tablet One up to 4 times daly if needed for pain 100 tablet 3   No current facility-administered medications for this visit.    Allergies as of 03/05/2014 - Review Complete 03/05/2014  Allergen Reaction Noted  . Codeine Nausea And Vomiting and Nausea Only 05/20/2007    Family History  Problem Relation Age of Onset  . Adopted: Yes  . Colon cancer Neg Hx   . Esophageal cancer Neg Hx   . Rectal cancer Neg Hx   . Stomach cancer Neg Hx     History   Social History  . Marital Status: Married    Spouse Name: N/A  . Number of Children: 3  . Years of Education: N/A   Occupational History  . disabled    Social History Main Topics  . Smoking status: Former Smoker -- 28 years    Types: Cigarettes    Quit date: 08/04/2011  . Smokeless tobacco: Never Used     Comment: 1/2 pack every 4 days  . Alcohol Use: No  . Drug Use: No  . Sexual Activity: Yes   Other Topics Concern  . Not on file   Social History Narrative       Physical Exam: Ht 5\' 7"  (1.702 m)  Wt 231 lb 8 oz (105.008 kg)  BMI 36.25 kg/m2 Constitutional: generally well-appearing Psychiatric: alert and oriented x3 Abdomen: soft, nontender, nondistended, no obvious ascites, no peritoneal signs, normal bowel sounds Rectal examination: No obvious external anal hemorrhoids or anal fissures. Digital rectal exam showed no distal rectal masses, stool was brown and Hemoccult negative.    Assessment  and plan: 54 y.o. male with minor rectal bleeding, likely from intermittent hemorrhoids  He does see small amount of blood on tissue paper. Often at this time he also has some minor anal discomfort. I suspect he has intermittent hemorrhoids, perhaps internal hemorrhoids. He says this is not so bothersome. Recent acute viral gastroenteritis possibly exacerbated the symptoms. I reassured him there is nothing serious going on. Recommended he try over-the-counter remedies for as needed discomforts or bleeding. He does call here if he is more concerned her bleeding intensifies.

## 2014-03-05 NOTE — Patient Instructions (Addendum)
Try Preparation H for intermittent anal discomfort, rectal bleeding. Call if bleeding worsens.  [id number] Nyoka Cowden

## 2014-03-08 DIAGNOSIS — D631 Anemia in chronic kidney disease: Secondary | ICD-10-CM | POA: Diagnosis not present

## 2014-03-08 DIAGNOSIS — N186 End stage renal disease: Secondary | ICD-10-CM | POA: Diagnosis not present

## 2014-03-10 DIAGNOSIS — N186 End stage renal disease: Secondary | ICD-10-CM | POA: Diagnosis not present

## 2014-03-10 DIAGNOSIS — D631 Anemia in chronic kidney disease: Secondary | ICD-10-CM | POA: Diagnosis not present

## 2014-03-12 DIAGNOSIS — D631 Anemia in chronic kidney disease: Secondary | ICD-10-CM | POA: Diagnosis not present

## 2014-03-12 DIAGNOSIS — N186 End stage renal disease: Secondary | ICD-10-CM | POA: Diagnosis not present

## 2014-03-15 DIAGNOSIS — N186 End stage renal disease: Secondary | ICD-10-CM | POA: Diagnosis not present

## 2014-03-15 DIAGNOSIS — D631 Anemia in chronic kidney disease: Secondary | ICD-10-CM | POA: Diagnosis not present

## 2014-03-17 ENCOUNTER — Ambulatory Visit (INDEPENDENT_AMBULATORY_CARE_PROVIDER_SITE_OTHER): Payer: Medicare Other | Admitting: Internal Medicine

## 2014-03-17 ENCOUNTER — Encounter: Payer: Self-pay | Admitting: Internal Medicine

## 2014-03-17 VITALS — BP 154/98 | HR 71 | Temp 98.1°F | Ht 67.0 in | Wt 232.0 lb

## 2014-03-17 DIAGNOSIS — N186 End stage renal disease: Secondary | ICD-10-CM

## 2014-03-17 DIAGNOSIS — D631 Anemia in chronic kidney disease: Secondary | ICD-10-CM | POA: Diagnosis not present

## 2014-03-17 DIAGNOSIS — N189 Chronic kidney disease, unspecified: Secondary | ICD-10-CM

## 2014-03-17 DIAGNOSIS — E669 Obesity, unspecified: Secondary | ICD-10-CM

## 2014-03-17 DIAGNOSIS — N2889 Other specified disorders of kidney and ureter: Secondary | ICD-10-CM | POA: Diagnosis not present

## 2014-03-17 DIAGNOSIS — B369 Superficial mycosis, unspecified: Secondary | ICD-10-CM | POA: Diagnosis not present

## 2014-03-17 DIAGNOSIS — E1121 Type 2 diabetes mellitus with diabetic nephropathy: Secondary | ICD-10-CM | POA: Diagnosis not present

## 2014-03-17 DIAGNOSIS — I151 Hypertension secondary to other renal disorders: Secondary | ICD-10-CM | POA: Diagnosis not present

## 2014-03-17 DIAGNOSIS — E785 Hyperlipidemia, unspecified: Secondary | ICD-10-CM

## 2014-03-17 MED ORDER — NYSTATIN-TRIAMCINOLONE 100000-0.1 UNIT/GM-% EX CREA: TOPICAL_CREAM | CUTANEOUS | Status: DC

## 2014-03-17 NOTE — Progress Notes (Signed)
Patient ID: Kirk [name], male   DOB: 03/01/1960, 54 y.o.   MRN: [id number]    Facility  [facility]    Place of Service:   OFFICE   Allergies  Allergen Reactions  . Codeine Nausea And Vomiting and Nausea Only    Chief Complaint  Patient presents with  . Medical Management of Chronic Issues    3 Month Follow up    HPI:  Type 2 diabetes mellitus with diabetic nephropathy -stable  Hypertension secondary to other renal disorders - Pcontrolled  Hyperlipemia - controlled  Anemia in chronic renal disease: stable  Fungal dermatitis: Here rash in the right lower quadrant close to his incision site where the transplanted kidney was placed and then removed. There are features suggestive of a fungal rash. It has been itching and sometimes burns. It never bleeds.  ESRD (end stage renal disease): Continues on hemodialysis.  Obesity: Continues to be overweight. Weight has not changed any. He is not compliant in following a diet that would help him lose weight.    Medications: Patient's Medications  New Prescriptions   No medications on file  Previous Medications   ACETAMINOPHEN (TYLENOL) 500 MG TABLET    Take 500 mg by mouth every 4 (four) hours as needed for mild pain.    ALBUTEROL-IPRATROPIUM (COMBIVENT) 18-103 MCG/ACT INHALER    Inhale 2 puffs into the lungs every 6 (six) hours as needed for wheezing.   AMLODIPINE (NORVASC) 10 MG TABLET    Take 10 mg by mouth daily.   ASPIRIN EC 81 MG TABLET    Take 81 mg by mouth daily.   B COMPLEX-C-FOLIC ACID (NEPHRO-VITE PO)    Take by mouth.   B COMPLEX-VITAMIN C-FOLIC ACID (NEPHRO-VITE) 0.8 MG TABS TABLET    TAKE 1 TABLET BY MOUTH EVERY DAY   CALCIUM ACETATE (PHOSLO) 667 MG CAPSULE    Take 667 mg by mouth See admin instructions. 4 by mouth with each meal and 2 by mouth with snacks   CARVEDILOL (COREG) 25 MG TABLET    Take 25 mg by mouth 2 (two) times daily. Take one tablet in the morning and one tablet in the evening   CHOLECALCIFEROL 5000 UNITS  TABS    Take 5,000 Units by mouth every morning.   DIPHENHYDRAMINE (BENADRYL) 25 MG TABLET    Take 25 mg by mouth 2 (two) times daily.    DOXAZOSIN (CARDURA) 4 MG TABLET    Take 2 mg by mouth daily. Take one tablet by mouth once daily to regulate blood pressure   ONDANSETRON (ZOFRAN ODT) 4 MG DISINTEGRATING TABLET    Take 1 tablet (4 mg total) by mouth every 8 (eight) hours as needed for nausea or vomiting.   PRAVASTATIN (PRAVACHOL) 40 MG TABLET    Take 40 mg by mouth every evening.   TRAMADOL (ULTRAM) 50 MG TABLET    One up to 4 times daly if needed for pain  Modified Medications   No medications on file  Discontinued Medications   No medications on file     Review of Systems  Constitutional: Negative for fever.       Chronically fatigued.  Eyes: Negative.   Respiratory: Negative for shortness of breath.        Dyspnea on exertion.  Cardiovascular: Negative for chest pain, palpitations and leg swelling.  Gastrointestinal: Negative for abdominal pain and abdominal distention.  Endocrine:       Diabetes is controlled.  Genitourinary: Negative.  Renal failure and kidney transplant.  Musculoskeletal:       Neck discomfort. Chronic right knee discomfort. Chronic back pains. Unsteady gait.  Skin:       Rash right lower quadrant of the abdomen.  Neurological: Positive for dizziness.       Memory is worse. Feet feel numb. Recurrent headaches  Hematological:       Anemia of CKD.  Psychiatric/Behavioral: Positive for confusion and decreased concentration.       Insomnia. Falls asleep in the day.    Filed Vitals:   03/17/14 1158  BP: 154/98  Pulse: 71  Temp: 98.1 F (36.7 C)  TempSrc: Oral  Height: $Remove'5\' 7"'[id number]$  (1.702 m)  Weight: 232 lb (105.235 kg)   Body mass index is 36.33 kg/(m^2).  Physical Exam  Constitutional: He is oriented to person, place, and time.  Obese.  HENT:  Loss of hearing.  Eyes:  Corrective lenses  Neck: No JVD present. No tracheal deviation present.  No thyromegaly present.  Cardiovascular: Exam reveals no gallop.   No murmur heard. AF. 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  Abdominal: Soft. Bowel sounds are normal. He exhibits no distension and no mass. There is no tenderness.  Musculoskeletal: Normal range of motion. He exhibits tenderness. He exhibits no edema.  Scar left knee. Tender right knee  Lymphadenopathy:    He has no cervical adenopathy.  Neurological: He is alert and oriented to person, place, and time. No cranial nerve deficit. Coordination normal.  Positive Tinel sign bilaterally  Skin: No rash noted. No erythema. No pallor.  Rash in the right lower quadrant of the abdomen. There is erythema. There is an irregular almost linear component to it. There is dryness. Skin coloration inside the rash is slightly different and skin coloration outside the rash.  Psychiatric:  Much more energetic acting than on most of his prior visits.     Labs reviewed: Admission on 02/16/2014, Discharged on 02/17/2014  Component Date Value Ref Range Status  . WBC 02/16/2014 4.2  4.0 - 10.5 K/uL Final  . RBC 02/16/2014 3.37* 4.22 - 5.81 MIL/uL Final  . Hemoglobin 02/16/2014 11.8* 13.0 - 17.0 g/dL Final   REPEATED TO VERIFY  . HCT 02/16/2014 35.8* 39.0 - 52.0 % Final  . MCV 02/16/2014 106.2* 78.0 - 100.0 fL Final   REPEATED TO VERIFY  . MCH 02/16/2014 35.0* 26.0 - 34.0 pg Final  . MCHC 02/16/2014 33.0  30.0 - 36.0 g/dL Final  . RDW 02/16/2014 15.2  11.5 - 15.5 % Final  . Platelets 02/16/2014 107* 150 - 400 K/uL Final   PLATELET COUNT CONFIRMED BY SMEAR  . Neutrophils Relative % 02/16/2014 80* 43 - 77 % Final  . Neutro Abs 02/16/2014 3.4  1.7 - 7.7 K/uL Final  . Lymphocytes Relative 02/16/2014 9* 12 - 46 % Final  . Lymphs Abs 02/16/2014 0.4* 0.7 - 4.0 K/uL Final  . Monocytes Relative 02/16/2014 9  3 - 12 % Final  . Monocytes Absolute 02/16/2014 0.4  0.1 - 1.0 K/uL Final  . Eosinophils Relative 02/16/2014 2  0 - 5 % Final  . Eosinophils  Absolute 02/16/2014 0.1  0.0 - 0.7 K/uL Final  . Basophils Relative 02/16/2014 1  0 - 1 % Final  . Basophils Absolute 02/16/2014 0.0  0.0 - 0.1 K/uL Final  . Sodium 02/16/2014 138  135 - 145 mmol/L Final  . Potassium 02/16/2014 5.2* 3.5 - 5.1 mmol/L Final  . Chloride 02/16/2014 102  96 - 112  mmol/L Final  . CO2 02/16/2014 21  19 - 32 mmol/L Final  . Glucose, Bld 02/16/2014 169* 70 - 99 mg/dL Final  . BUN 02/16/2014 49* 6 - 23 mg/dL Final  . Creatinine, Ser 02/16/2014 12.47* 0.50 - 1.35 mg/dL Final  . Calcium 02/16/2014 9.3  8.4 - 10.5 mg/dL Final  . Total Protein 02/16/2014 7.3  6.0 - 8.3 g/dL Final  . Albumin 02/16/2014 3.8  3.5 - 5.2 g/dL Final  . AST 02/16/2014 29  0 - 37 U/L Final  . ALT 02/16/2014 29  0 - 53 U/L Final  . Alkaline Phosphatase 02/16/2014 54  39 - 117 U/L Final  . Total Bilirubin 02/16/2014 0.6  0.3 - 1.2 mg/dL Final  . GFR calc non Af Amer 02/16/2014 4* >90 mL/min Final  . GFR calc Af Amer 02/16/2014 5* >90 mL/min Final   Comment: (NOTE) The eGFR has been calculated using the CKD EPI equation. This calculation has not been validated in all clinical situations. eGFR's persistently <90 mL/min signify possible Chronic Kidney Disease.   . Anion gap 02/16/2014 15  5 - 15 Final  . Lipase 02/16/2014 80* 11 - 59 U/L Final  . Color, Urine 02/16/2014 YELLOW  YELLOW Final  . APPearance 02/16/2014 CLEAR  CLEAR Final  . Specific Gravity, Urine 02/16/2014 1.014  1.005 - 1.030 Final  . pH 02/16/2014 5.5  5.0 - 8.0 Final  . Glucose, UA 02/16/2014 NEGATIVE  NEGATIVE mg/dL Final  . Hgb urine dipstick 02/16/2014 SMALL* NEGATIVE Final  . Bilirubin Urine 02/16/2014 NEGATIVE  NEGATIVE Final  . Ketones, ur 02/16/2014 NEGATIVE  NEGATIVE mg/dL Final  . Protein, ur 02/16/2014 100* NEGATIVE mg/dL Final  . Urobilinogen, UA 02/16/2014 0.2  0.0 - 1.0 mg/dL Final  . Nitrite 02/16/2014 NEGATIVE  NEGATIVE Final  . Leukocytes, UA 02/16/2014 NEGATIVE  NEGATIVE Final  . Troponin i, poc  02/16/2014 0.00  0.00 - 0.08 ng/mL Final  . Comment 3 02/16/2014          Final   Comment: Due to the release kinetics of cTnI, a negative result within the first hours of the onset of symptoms does not rule out myocardial infarction with certainty. If myocardial infarction is still suspected, repeat the test at appropriate intervals.   . Campylobacter by PCR 02/16/2014 Not Detected   Final  . C difficile toxin A/B 02/16/2014 Not Detected   Final  . E coli 0157 by PCR 02/16/2014 Not Detected   Final  . E coli (ETEC) LT/ST 02/16/2014 Not Detected   Final  . E coli (STEC) 02/16/2014 Not Detected   Final  . Salmonella by PCR 02/16/2014 Not Detected   Final  . Shigella by PCR 02/16/2014 Not Detected   Final   Comment: (NOTE) Reference value for all analytes:Not Detected The results of this test should not be used as the sole basis for diagnosis, treatment, or other patient management decisions.xTAG(R) GPP positive results are presumptive and must be confirmed by FDA-cleared tests or other acceptable reference methods.Confirmed positive results do not rule out co-infection with other organisms that are not detected by this test, and may not be the sole or definitive cause of patient illness.Negative xTAG(R) Gastrointestinal Pathogen Panel results in the setting of clinical illness compatible with gastroenteritis may be due to infection by pathogens that are not detected by this test or non-infectious causes such as ulcerative colitis, irritable bowel syndrome, or Crohn's disease.xTAG GPP is not intended to monitor or guide treatment for C.  difficile infections. Performed At: [facility] [place], [place] [id number] Ileana Ladd PhD                           VJ:[id number]   . Norovirus G!/G2 02/16/2014 Comment   Final   POSITIVE  . Rotavirus A by PCR 02/16/2014 Not Detected   Final  . G lamblia by PCR 02/16/2014 Not Detected    Final  . Cryptosporidium by PCR 02/16/2014 Not Detected   Final  . WBC 02/17/2014 2.4* 4.0 - 10.5 K/uL Final  . RBC 02/17/2014 3.23* 4.22 - 5.81 MIL/uL Final  . Hemoglobin 02/17/2014 11.0* 13.0 - 17.0 g/dL Final  . HCT 02/17/2014 33.8* 39.0 - 52.0 % Final  . MCV 02/17/2014 104.6* 78.0 - 100.0 fL Final  . MCH 02/17/2014 34.1* 26.0 - 34.0 pg Final  . MCHC 02/17/2014 32.5  30.0 - 36.0 g/dL Final  . RDW 02/17/2014 14.7  11.5 - 15.5 % Final  . Platelets 02/17/2014 102* 150 - 400 K/uL Final   CONSISTENT WITH PREVIOUS RESULT  . Neutrophils Relative % 02/17/2014 41* 43 - 77 % Final  . Lymphocytes Relative 02/17/2014 32  12 - 46 % Final  . Monocytes Relative 02/17/2014 18* 3 - 12 % Final  . Eosinophils Relative 02/17/2014 8* 0 - 5 % Final  . Basophils Relative 02/17/2014 1  0 - 1 % Final  . Neutro Abs 02/17/2014 1.0* 1.7 - 7.7 K/uL Final  . Lymphs Abs 02/17/2014 0.8  0.7 - 4.0 K/uL Final  . Monocytes Absolute 02/17/2014 0.4  0.1 - 1.0 K/uL Final  . Eosinophils Absolute 02/17/2014 0.2  0.0 - 0.7 K/uL Final  . Basophils Absolute 02/17/2014 0.0  0.0 - 0.1 K/uL Final  . RBC Morphology 02/17/2014 POLYCHROMASIA PRESENT   Final   TEARDROP CELLS  . WBC Morphology 02/17/2014 ATYPICAL LYMPHOCYTES   Final  . Sodium 02/17/2014 134* 135 - 145 mmol/L Final  . Potassium 02/17/2014 5.2* 3.5 - 5.1 mmol/L Final  . Chloride 02/17/2014 100  96 - 112 mmol/L Final  . CO2 02/17/2014 20  19 - 32 mmol/L Final  . Glucose, Bld 02/17/2014 100* 70 - 99 mg/dL Final  . BUN 02/17/2014 58* 6 - 23 mg/dL Final  . Creatinine, Ser 02/17/2014 14.32* 0.50 - 1.35 mg/dL Final  . Calcium 02/17/2014 8.6  8.4 - 10.5 mg/dL Final  . Total Protein 02/17/2014 7.0  6.0 - 8.3 g/dL Final  . Albumin 02/17/2014 3.5  3.5 - 5.2 g/dL Final  . AST 02/17/2014 34  0 - 37 U/L Final  . ALT 02/17/2014 36  0 - 53 U/L Final  . Alkaline Phosphatase 02/17/2014 47  39 - 117 U/L Final  . Total Bilirubin 02/17/2014 0.7  0.3 - 1.2 mg/dL Final  . GFR calc  non Af Amer 02/17/2014 3* >90 mL/min Final  . GFR calc Af Amer 02/17/2014 4* >90 mL/min Final   Comment: (NOTE) The eGFR has been calculated using the CKD EPI equation. This calculation has not been validated in all clinical situations. eGFR's persistently <90 mL/min signify possible Chronic Kidney Disease.   . Anion gap 02/17/2014 14  5 - 15 Final  . Specimen Description 02/16/2014 STOOL   Final  . Special Requests 02/16/2014 NONE   Final  . Fecal Lactoferrin 02/16/2014    Final  Value:NEGATIVE Performed at [facility]   . Report Status 02/16/2014 02/17/2014 FINAL   Final  . Specimen Description 02/16/2014 STOOL   Final  . Special Requests 02/16/2014 NONE   Final  . Culture 02/16/2014    Final                   Value:NO SALMONELLA, SHIGELLA, CAMPYLOBACTER, YERSINIA, OR E.COLI 0157:H7 ISOLATED Performed at [facility]   . Report Status 02/16/2014 02/20/2014 FINAL   Final  . C difficile by pcr 02/16/2014 NEGATIVE  NEGATIVE Final  . Squamous Epithelial / LPF 02/16/2014 RARE  RARE Final  . WBC, UA 02/16/2014 0-2  <3 WBC/hpf Final  . RBC / HPF 02/16/2014 0-2  <3 RBC/hpf Final     Assessment/Plan  1. Type 2 diabetes mellitus with diabetic nephropathy - Comprehensive metabolic panel; Future - Hemoglobin A1c; Future  2. Hypertension secondary to other renal disorders - Comprehensive metabolic panel; Future  3. Hyperlipemia - Lipid panel; Future  4. Anemia in chronic renal disease -CBC, future  5. Fungal dermatitis - nystatin-triamcinolone (MYCOLOG II) cream; Apply twice daily to rash on abdomen  Dispense: 30 g; Refill: 0  6. ESRD (end stage renal disease) Continue dialysis  7. Obesity Follow a  proper diet

## 2014-03-19 DIAGNOSIS — D631 Anemia in chronic kidney disease: Secondary | ICD-10-CM | POA: Diagnosis not present

## 2014-03-19 DIAGNOSIS — N186 End stage renal disease: Secondary | ICD-10-CM | POA: Diagnosis not present

## 2014-03-22 DIAGNOSIS — Z992 Dependence on renal dialysis: Secondary | ICD-10-CM | POA: Diagnosis not present

## 2014-03-22 DIAGNOSIS — N186 End stage renal disease: Secondary | ICD-10-CM | POA: Diagnosis not present

## 2014-03-22 DIAGNOSIS — D631 Anemia in chronic kidney disease: Secondary | ICD-10-CM | POA: Diagnosis not present

## 2014-03-24 DIAGNOSIS — D631 Anemia in chronic kidney disease: Secondary | ICD-10-CM | POA: Diagnosis not present

## 2014-03-24 DIAGNOSIS — N186 End stage renal disease: Secondary | ICD-10-CM | POA: Diagnosis not present

## 2014-03-26 DIAGNOSIS — N186 End stage renal disease: Secondary | ICD-10-CM | POA: Diagnosis not present

## 2014-03-26 DIAGNOSIS — D631 Anemia in chronic kidney disease: Secondary | ICD-10-CM | POA: Diagnosis not present

## 2014-03-29 ENCOUNTER — Other Ambulatory Visit: Payer: Self-pay | Admitting: *Deleted

## 2014-03-29 DIAGNOSIS — N186 End stage renal disease: Secondary | ICD-10-CM | POA: Diagnosis not present

## 2014-03-29 DIAGNOSIS — D631 Anemia in chronic kidney disease: Secondary | ICD-10-CM | POA: Diagnosis not present

## 2014-03-29 MED ORDER — AMLODIPINE BESYLATE 10 MG PO TABS
10.0000 mg | ORAL_TABLET | Freq: Every day | ORAL | Status: DC
Start: 2014-03-29 — End: 2014-10-05

## 2014-03-29 MED ORDER — DOXAZOSIN MESYLATE 4 MG PO TABS: 2.0000 mg | ORAL_TABLET | Freq: Every day | ORAL | Status: DC

## 2014-03-29 MED ORDER — CARVEDILOL 25 MG PO TABS: 25.0000 mg | ORAL_TABLET | Freq: Two times a day (BID) | ORAL | Status: DC

## 2014-03-29 MED ORDER — PRAVASTATIN SODIUM 40 MG PO TABS: 40.0000 mg | ORAL_TABLET | Freq: Every evening | ORAL | Status: DC

## 2014-03-31 DIAGNOSIS — N186 End stage renal disease: Secondary | ICD-10-CM | POA: Diagnosis not present

## 2014-03-31 DIAGNOSIS — D631 Anemia in chronic kidney disease: Secondary | ICD-10-CM | POA: Diagnosis not present

## 2014-04-01 ENCOUNTER — Encounter: Payer: Self-pay | Admitting: *Deleted

## 2014-04-01 MED ORDER — DOXAZOSIN MESYLATE 4 MG PO TABS: ORAL_TABLET | ORAL | Status: DC

## 2014-04-02 DIAGNOSIS — N186 End stage renal disease: Secondary | ICD-10-CM | POA: Diagnosis not present

## 2014-04-02 DIAGNOSIS — D631 Anemia in chronic kidney disease: Secondary | ICD-10-CM | POA: Diagnosis not present

## 2014-04-05 DIAGNOSIS — N186 End stage renal disease: Secondary | ICD-10-CM | POA: Diagnosis not present

## 2014-04-05 DIAGNOSIS — D631 Anemia in chronic kidney disease: Secondary | ICD-10-CM | POA: Diagnosis not present

## 2014-04-06 ENCOUNTER — Emergency Department (HOSPITAL_COMMUNITY): Payer: Medicare Other

## 2014-04-06 ENCOUNTER — Inpatient Hospital Stay (HOSPITAL_COMMUNITY)
Admission: EM | Admit: 2014-04-06 | Discharge: 2014-04-16 | DRG: 193 | Disposition: A | Discharge status: Home / Self Care | Payer: Medicare Other | Attending: Internal Medicine | Admitting: Internal Medicine

## 2014-04-06 ENCOUNTER — Inpatient Hospital Stay (HOSPITAL_COMMUNITY): Payer: Medicare Other

## 2014-04-06 ENCOUNTER — Encounter (HOSPITAL_COMMUNITY): Payer: Self-pay | Admitting: Emergency Medicine

## 2014-04-06 DIAGNOSIS — R0902 Hypoxemia: Secondary | ICD-10-CM

## 2014-04-06 DIAGNOSIS — R1013 Epigastric pain: Secondary | ICD-10-CM | POA: Diagnosis not present

## 2014-04-06 DIAGNOSIS — Z87891 Personal history of nicotine dependence: Secondary | ICD-10-CM | POA: Diagnosis not present

## 2014-04-06 DIAGNOSIS — H9190 Unspecified hearing loss, unspecified ear: Secondary | ICD-10-CM | POA: Diagnosis present

## 2014-04-06 DIAGNOSIS — I120 Hypertensive chronic kidney disease with stage 5 chronic kidney disease or end stage renal disease: Secondary | ICD-10-CM | POA: Diagnosis present

## 2014-04-06 DIAGNOSIS — Y95 Nosocomial condition: Secondary | ICD-10-CM | POA: Diagnosis present

## 2014-04-06 DIAGNOSIS — Z940 Kidney transplant status: Secondary | ICD-10-CM | POA: Diagnosis not present

## 2014-04-06 DIAGNOSIS — D631 Anemia in chronic kidney disease: Secondary | ICD-10-CM | POA: Diagnosis not present

## 2014-04-06 DIAGNOSIS — E875 Hyperkalemia: Secondary | ICD-10-CM | POA: Diagnosis not present

## 2014-04-06 DIAGNOSIS — E1122 Type 2 diabetes mellitus with diabetic chronic kidney disease: Secondary | ICD-10-CM | POA: Diagnosis present

## 2014-04-06 DIAGNOSIS — E785 Hyperlipidemia, unspecified: Secondary | ICD-10-CM | POA: Diagnosis present

## 2014-04-06 DIAGNOSIS — D696 Thrombocytopenia, unspecified: Secondary | ICD-10-CM | POA: Diagnosis present

## 2014-04-06 DIAGNOSIS — D72819 Decreased white blood cell count, unspecified: Secondary | ICD-10-CM | POA: Diagnosis present

## 2014-04-06 DIAGNOSIS — Z992 Dependence on renal dialysis: Secondary | ICD-10-CM | POA: Diagnosis not present

## 2014-04-06 DIAGNOSIS — E1121 Type 2 diabetes mellitus with diabetic nephropathy: Secondary | ICD-10-CM | POA: Diagnosis not present

## 2014-04-06 DIAGNOSIS — R0600 Dyspnea, unspecified: Secondary | ICD-10-CM | POA: Diagnosis present

## 2014-04-06 DIAGNOSIS — G4733 Obstructive sleep apnea (adult) (pediatric): Secondary | ICD-10-CM | POA: Diagnosis present

## 2014-04-06 DIAGNOSIS — N186 End stage renal disease: Secondary | ICD-10-CM | POA: Diagnosis present

## 2014-04-06 DIAGNOSIS — E1129 Type 2 diabetes mellitus with other diabetic kidney complication: Secondary | ICD-10-CM | POA: Diagnosis present

## 2014-04-06 DIAGNOSIS — R0602 Shortness of breath: Secondary | ICD-10-CM | POA: Diagnosis not present

## 2014-04-06 DIAGNOSIS — I10 Essential (primary) hypertension: Secondary | ICD-10-CM | POA: Diagnosis present

## 2014-04-06 DIAGNOSIS — Z7982 Long term (current) use of aspirin: Secondary | ICD-10-CM

## 2014-04-06 DIAGNOSIS — G40209 Localization-related (focal) (partial) symptomatic epilepsy and epileptic syndromes with complex partial seizures, not intractable, without status epilepticus: Secondary | ICD-10-CM | POA: Diagnosis present

## 2014-04-06 DIAGNOSIS — R079 Chest pain, unspecified: Secondary | ICD-10-CM | POA: Diagnosis not present

## 2014-04-06 DIAGNOSIS — J810 Acute pulmonary edema: Secondary | ICD-10-CM | POA: Diagnosis not present

## 2014-04-06 DIAGNOSIS — N189 Chronic kidney disease, unspecified: Secondary | ICD-10-CM | POA: Diagnosis not present

## 2014-04-06 DIAGNOSIS — J9601 Acute respiratory failure with hypoxia: Secondary | ICD-10-CM

## 2014-04-06 DIAGNOSIS — J189 Pneumonia, unspecified organism: Secondary | ICD-10-CM | POA: Diagnosis not present

## 2014-04-06 DIAGNOSIS — Z9989 Dependence on other enabling machines and devices: Secondary | ICD-10-CM

## 2014-04-06 DIAGNOSIS — R918 Other nonspecific abnormal finding of lung field: Secondary | ICD-10-CM | POA: Diagnosis not present

## 2014-04-06 DIAGNOSIS — J441 Chronic obstructive pulmonary disease with (acute) exacerbation: Secondary | ICD-10-CM | POA: Diagnosis present

## 2014-04-06 DIAGNOSIS — J811 Chronic pulmonary edema: Secondary | ICD-10-CM | POA: Diagnosis present

## 2014-04-06 LAB — CBC WITH DIFFERENTIAL/PLATELET
Basophils Absolute: 0 10*3/uL (ref 0.0–0.1)
Basophils Relative: 0 % (ref 0–1)
EOS PCT: 1 % (ref 0–5)
Eosinophils Absolute: 0.1 10*3/uL (ref 0.0–0.7)
HEMATOCRIT: 33.6 % — AB (ref 39.0–52.0)
HEMOGLOBIN: 11.3 g/dL — AB (ref 13.0–17.0)
LYMPHS ABS: 0.4 10*3/uL — AB (ref 0.7–4.0)
Lymphocytes Relative: 8 % — ABNORMAL LOW (ref 12–46)
MCH: 33.2 pg (ref 26.0–34.0)
MCHC: 33.6 g/dL (ref 30.0–36.0)
MCV: 98.8 fL (ref 78.0–100.0)
MONO ABS: 0.4 10*3/uL (ref 0.1–1.0)
Monocytes Relative: 7 % (ref 3–12)
Neutro Abs: 5 10*3/uL (ref 1.7–7.7)
Neutrophils Relative %: 84 % — ABNORMAL HIGH (ref 43–77)
Platelets: 76 10*3/uL — ABNORMAL LOW (ref 150–400)
RBC: 3.4 MIL/uL — ABNORMAL LOW (ref 4.22–5.81)
RDW: 12.7 % (ref 11.5–15.5)
Smear Review: DECREASED
WBC: 5.9 10*3/uL (ref 4.0–10.5)

## 2014-04-06 LAB — URINE MICROSCOPIC-ADD ON

## 2014-04-06 LAB — INFLUENZA PANEL BY PCR (TYPE A & B)
H1N1 flu by pcr: NOT DETECTED
INFLAPCR: NEGATIVE
Influenza B By PCR: NEGATIVE

## 2014-04-06 LAB — COMPREHENSIVE METABOLIC PANEL
ALK PHOS: 53 U/L (ref 39–117)
ALT: 27 U/L (ref 0–53)
ANION GAP: 14 (ref 5–15)
AST: 25 U/L (ref 0–37)
Albumin: 4.1 g/dL (ref 3.5–5.2)
BUN: 36 mg/dL — ABNORMAL HIGH (ref 6–23)
CO2: 28 mmol/L (ref 19–32)
Calcium: 8.7 mg/dL (ref 8.4–10.5)
Chloride: 98 mmol/L (ref 96–112)
Creatinine, Ser: 10.06 mg/dL — ABNORMAL HIGH (ref 0.50–1.35)
GFR, EST AFRICAN AMERICAN: 6 mL/min — AB (ref >90)
GFR, EST NON AFRICAN AMERICAN: 5 mL/min — AB (ref >90)
GLUCOSE: 134 mg/dL — AB (ref 70–99)
Potassium: 4.1 mmol/L (ref 3.5–5.1)
SODIUM: 140 mmol/L (ref 135–145)
Total Bilirubin: 1 mg/dL (ref 0.3–1.2)
Total Protein: 8.1 g/dL (ref 6.0–8.3)

## 2014-04-06 LAB — GLUCOSE, CAPILLARY
Glucose-Capillary: 171 mg/dL — ABNORMAL HIGH (ref 70–99)
Glucose-Capillary: 190 mg/dL — ABNORMAL HIGH (ref 70–99)
Glucose-Capillary: 231 mg/dL — ABNORMAL HIGH (ref 70–99)
Glucose-Capillary: 191 mg/dL — ABNORMAL HIGH (ref 70–99)

## 2014-04-06 LAB — URINALYSIS, ROUTINE W REFLEX MICROSCOPIC
Bilirubin Urine: NEGATIVE
Glucose, UA: 100 mg/dL — AB
Ketones, ur: NEGATIVE mg/dL
LEUKOCYTES UA: NEGATIVE
NITRITE: NEGATIVE
PH: 8 (ref 5.0–8.0)
Protein, ur: >300 mg/dL — AB
Specific Gravity, Urine: 1.02 (ref 1.005–1.030)
Urobilinogen, UA: 0.2 mg/dL (ref 0.0–1.0)

## 2014-04-06 LAB — TROPONIN I: Troponin I: <0.03 ng/mL (ref <0.031)

## 2014-04-06 LAB — MRSA PCR SCREENING: MRSA BY PCR: NEGATIVE

## 2014-04-06 LAB — BRAIN NATRIURETIC PEPTIDE: B NATRIURETIC PEPTIDE 5: 264 pg/mL — AB (ref 0.0–100.0)

## 2014-04-06 LAB — STREP PNEUMONIAE URINARY ANTIGEN: Strep Pneumo Urinary Antigen: NEGATIVE

## 2014-04-06 LAB — LACTIC ACID, PLASMA: LACTIC ACID, VENOUS: 1.5 mmol/L (ref 0.5–2.0)

## 2014-04-06 MED ORDER — CALCIUM ACETATE (PHOS BINDER) 667 MG PO CAPS: 2668.0000 mg | ORAL_CAPSULE | Freq: Three times a day (TID) | ORAL | Status: DC

## 2014-04-06 MED ORDER — LANTHANUM CARBONATE 500 MG PO CHEW
1000.0000 mg | CHEWABLE_TABLET | Freq: Three times a day (TID) | ORAL | Status: DC
  Administered 2014-04-07 – 2014-04-09 (×7): 1000 mg via ORAL
  Filled 2014-04-06 (×9): qty 2

## 2014-04-06 MED ORDER — CARVEDILOL 12.5 MG PO TABS
25.0000 mg | ORAL_TABLET | Freq: Two times a day (BID) | ORAL | Status: DC
  Administered 2014-04-06 – 2014-04-16 (×18): 25 mg via ORAL
  Filled 2014-04-06 (×18): qty 2

## 2014-04-06 MED ORDER — ALBUTEROL (5 MG/ML) CONTINUOUS INHALATION SOLN
10.0000 mg/h | INHALATION_SOLUTION | RESPIRATORY_TRACT | Status: AC
  Administered 2014-04-06: 10 mg/h via RESPIRATORY_TRACT

## 2014-04-06 MED ORDER — CALCIUM ACETATE (PHOS BINDER) 667 MG PO CAPS
1334.0000 mg | ORAL_CAPSULE | Freq: Two times a day (BID) | ORAL | Status: DC | PRN
  Filled 2014-04-06 (×2): qty 2

## 2014-04-06 MED ORDER — DEXTROSE 5 % IV SOLN
2.0000 g | Freq: Once | INTRAVENOUS | Status: AC
  Administered 2014-04-06: 2 g via INTRAVENOUS
  Filled 2014-04-06: qty 2

## 2014-04-06 MED ORDER — IPRATROPIUM BROMIDE 0.02 % IN SOLN
RESPIRATORY_TRACT | Status: AC
  Filled 2014-04-06: qty 5

## 2014-04-06 MED ORDER — DOXAZOSIN MESYLATE 2 MG PO TABS
2.0000 mg | ORAL_TABLET | Freq: Two times a day (BID) | ORAL | Status: DC
  Administered 2014-04-06 – 2014-04-16 (×18): 2 mg via ORAL
  Filled 2014-04-06 (×19): qty 1

## 2014-04-06 MED ORDER — LORAZEPAM 2 MG/ML IJ SOLN
1.0000 mg | Freq: Once | INTRAMUSCULAR | Status: AC
  Administered 2014-04-07: 1 mg via INTRAVENOUS
  Filled 2014-04-06: qty 1

## 2014-04-06 MED ORDER — MORPHINE SULFATE 4 MG/ML IJ SOLN
4.0000 mg | Freq: Once | INTRAMUSCULAR | Status: AC
  Administered 2014-04-06: 4 mg via INTRAVENOUS

## 2014-04-06 MED ORDER — IPRATROPIUM-ALBUTEROL 0.5-2.5 (3) MG/3ML IN SOLN
3.0000 mL | Freq: Four times a day (QID) | RESPIRATORY_TRACT | Status: DC
  Administered 2014-04-06 – 2014-04-13 (×28): 3 mL via RESPIRATORY_TRACT
  Filled 2014-04-06 (×30): qty 3

## 2014-04-06 MED ORDER — TRAMADOL HCL 50 MG PO TABS: 50.0000 mg | ORAL_TABLET | Freq: Four times a day (QID) | ORAL | Status: DC | PRN

## 2014-04-06 MED ORDER — ALBUTEROL SULFATE (2.5 MG/3ML) 0.083% IN NEBU: 2.5000 mg | INHALATION_SOLUTION | Freq: Once | RESPIRATORY_TRACT | Status: DC

## 2014-04-06 MED ORDER — CEFTRIAXONE SODIUM IN DEXTROSE 20 MG/ML IV SOLN: 1.0000 g | INTRAVENOUS | Status: DC

## 2014-04-06 MED ORDER — SODIUM CHLORIDE 0.9 % IJ SOLN
3.0000 mL | Freq: Two times a day (BID) | INTRAMUSCULAR | Status: DC
  Administered 2014-04-06 – 2014-04-16 (×20): 3 mL via INTRAVENOUS

## 2014-04-06 MED ORDER — DEXTROSE 5 % IV SOLN: 500.0000 mg | INTRAVENOUS | Status: DC

## 2014-04-06 MED ORDER — IPRATROPIUM BROMIDE 0.02 % IN SOLN: 0.5000 mg | Freq: Four times a day (QID) | RESPIRATORY_TRACT | Status: DC

## 2014-04-06 MED ORDER — AMLODIPINE BESYLATE 5 MG PO TABS
10.0000 mg | ORAL_TABLET | Freq: Every day | ORAL | Status: DC
  Administered 2014-04-06 – 2014-04-16 (×8): 10 mg via ORAL
  Filled 2014-04-06 (×9): qty 2

## 2014-04-06 MED ORDER — ALBUTEROL (5 MG/ML) CONTINUOUS INHALATION SOLN
INHALATION_SOLUTION | RESPIRATORY_TRACT | Status: AC
  Filled 2014-04-06: qty 20

## 2014-04-06 MED ORDER — HEPARIN SODIUM (PORCINE) 5000 UNIT/ML IJ SOLN
5000.0000 [IU] | Freq: Three times a day (TID) | INTRAMUSCULAR | Status: DC
  Administered 2014-04-06 – 2014-04-09 (×11): 5000 [IU] via SUBCUTANEOUS
  Filled 2014-04-06 (×11): qty 1

## 2014-04-06 MED ORDER — TRAMADOL HCL 50 MG PO TABS
50.0000 mg | ORAL_TABLET | Freq: Four times a day (QID) | ORAL | Status: DC | PRN
  Administered 2014-04-09 – 2014-04-13 (×5): 100 mg via ORAL
  Administered 2014-04-14 – 2014-04-15 (×2): 50 mg via ORAL
  Filled 2014-04-06: qty 2
  Filled 2014-04-06: qty 1
  Filled 2014-04-06: qty 2
  Filled 2014-04-06: qty 1
  Filled 2014-04-06 (×3): qty 2

## 2014-04-06 MED ORDER — IPRATROPIUM-ALBUTEROL 0.5-2.5 (3) MG/3ML IN SOLN: 3.0000 mL | RESPIRATORY_TRACT | Status: DC

## 2014-04-06 MED ORDER — SODIUM CHLORIDE 0.9 % IV SOLN: 250.0000 mL | INTRAVENOUS | Status: DC | PRN

## 2014-04-06 MED ORDER — VANCOMYCIN HCL 10 G IV SOLR
1500.0000 mg | Freq: Once | INTRAVENOUS | Status: AC
  Administered 2014-04-06: 1500 mg via INTRAVENOUS
  Filled 2014-04-06: qty 1500

## 2014-04-06 MED ORDER — TRAMADOL HCL 50 MG PO TABS
100.0000 mg | ORAL_TABLET | Freq: Once | ORAL | Status: AC
  Administered 2014-04-06: 100 mg via ORAL
  Filled 2014-04-06: qty 2

## 2014-04-06 MED ORDER — SODIUM CHLORIDE 0.9 % IJ SOLN: 3.0000 mL | INTRAMUSCULAR | Status: DC | PRN

## 2014-04-06 MED ORDER — KETOROLAC TROMETHAMINE 15 MG/ML IJ SOLN
15.0000 mg | Freq: Four times a day (QID) | INTRAMUSCULAR | Status: DC | PRN
  Administered 2014-04-06 – 2014-04-08 (×3): 15 mg via INTRAVENOUS
  Filled 2014-04-06 (×3): qty 1

## 2014-04-06 MED ORDER — METHYLPREDNISOLONE SODIUM SUCC 125 MG IJ SOLR
125.0000 mg | Freq: Once | INTRAMUSCULAR | Status: AC
  Administered 2014-04-06: 125 mg via INTRAVENOUS
  Filled 2014-04-06: qty 2

## 2014-04-06 MED ORDER — ASPIRIN EC 81 MG PO TBEC
81.0000 mg | DELAYED_RELEASE_TABLET | Freq: Every day | ORAL | Status: DC
  Administered 2014-04-06 – 2014-04-16 (×11): 81 mg via ORAL
  Filled 2014-04-06 (×12): qty 1

## 2014-04-06 MED ORDER — IOHEXOL 350 MG/ML SOLN
100.0000 mL | Freq: Once | INTRAVENOUS | Status: AC | PRN
  Administered 2014-04-06: 100 mL via INTRAVENOUS

## 2014-04-06 MED ORDER — DEXTROSE 5 % IV SOLN
INTRAVENOUS | Status: AC
  Filled 2014-04-06: qty 2

## 2014-04-06 MED ORDER — MORPHINE SULFATE 4 MG/ML IJ SOLN
INTRAMUSCULAR | Status: AC
  Filled 2014-04-06: qty 1

## 2014-04-06 MED ORDER — ACETAMINOPHEN 325 MG PO TABS
650.0000 mg | ORAL_TABLET | Freq: Four times a day (QID) | ORAL | Status: DC | PRN
  Administered 2014-04-07 – 2014-04-13 (×3): 650 mg via ORAL
  Filled 2014-04-06 (×3): qty 2

## 2014-04-06 MED ORDER — ACETAMINOPHEN 650 MG RE SUPP: 650.0000 mg | Freq: Four times a day (QID) | RECTAL | Status: DC | PRN

## 2014-04-06 MED ORDER — PRAVASTATIN SODIUM 40 MG PO TABS
40.0000 mg | ORAL_TABLET | Freq: Every evening | ORAL | Status: DC
  Administered 2014-04-06 – 2014-04-16 (×11): 40 mg via ORAL
  Filled 2014-04-06 (×11): qty 1

## 2014-04-06 MED ORDER — IPRATROPIUM BROMIDE 0.02 % IN SOLN
0.5000 mg | Freq: Once | RESPIRATORY_TRACT | Status: AC
  Administered 2014-04-06: 0.5 mg via RESPIRATORY_TRACT

## 2014-04-06 MED ORDER — ALBUTEROL SULFATE (2.5 MG/3ML) 0.083% IN NEBU
2.5000 mg | INHALATION_SOLUTION | RESPIRATORY_TRACT | Status: DC | PRN
  Administered 2014-04-06 – 2014-04-09 (×3): 2.5 mg via RESPIRATORY_TRACT
  Filled 2014-04-06 (×3): qty 3

## 2014-04-06 MED ORDER — LANTHANUM CARBONATE 500 MG PO CHEW
500.0000 mg | CHEWABLE_TABLET | Freq: Every day | ORAL | Status: DC | PRN
  Filled 2014-04-06: qty 1

## 2014-04-06 MED ORDER — ALBUTEROL SULFATE (2.5 MG/3ML) 0.083% IN NEBU: 2.5000 mg | INHALATION_SOLUTION | Freq: Four times a day (QID) | RESPIRATORY_TRACT | Status: DC

## 2014-04-06 NOTE — Care Management Utilization Note (Signed)
UR completed 

## 2014-04-06 NOTE — Progress Notes (Addendum)
Patient seen and examined. Admitted earlier today for acute respiratory failure of undetermined etiology. CXr unremarkable. Had low grade temp so presumed viral. Flu PCR pending. Because of his unexplained hypoxemia I decided to perform a CT angio chest to r/o PE. No PE but patchy bilateral lower lobe infiltrates that could possibly be PNA. Will treat for HCAP given HD status with vanc/cefepime. Will continue to follow.  Domingo Mend, MD [facility] Pager: [phone]

## 2014-04-06 NOTE — ED Provider Notes (Signed)
CSN: [id number]     Arrival date & time 04/06/14  [id number] History   First MD Initiated Contact with Patient 04/06/14 0320     Chief Complaint  Patient presents with  . Shortness of Breath     (Consider location/radiation/quality/duration/timing/severity/associated sxs/prior Treatment) HPI Patient presents with shortness of breath starting yesterday afternoon after dialysis. Has been progressive throughout the evening. Associated with dry nonproductive cough. No fever or chills. Denies any chest pain or abdominal pain. Has had some diaphoresis. No lower extremity swelling.  Past Medical History  Diagnosis Date  . Anxiety   . Chronic headaches   . Chronic kidney disease (CKD)   . Dialysis care 02/06/2010  . HTN (hypertension)   . Sleep apnea     wears the CPAP  . Type II or unspecified type diabetes mellitus with renal manifestations, uncontrolled   . Shortness of breath   . Impotence of organic origin   . Other malaise and fatigue   . Debility, unspecified   . Cellulitis and abscess of trunk   . Kidney replaced by transplant   . Acute edema of lung, unspecified   . Unspecified constipation   . Other chronic nonalcoholic liver disease   . Localization-related (focal) (partial) epilepsy and epileptic syndromes with complex partial seizures, without mention of intractable epilepsy   . Tension headache   . Dermatophytosis of the body   . Gout, unspecified   . Pain in joint, upper arm   . Pain in joint, lower leg   . Other nonspecific abnormal serum enzyme levels   . Essential hypertension, benign   . Acute, but ill-defined, cerebrovascular disease   . Memory loss   . Hypotension, unspecified   . Hypertrophy of prostate without urinary obstruction and other lower urinary tract symptoms (LUTS)   . Renal dialysis status(V45.11)   . End stage renal disease   . Insomnia, unspecified   . Secondary hyperparathyroidism (of renal origin)   . Lumbago   . Unspecified vitamin D deficiency    . Carpal tunnel syndrome   . 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)   . Unspecified hereditary and idiopathic peripheral neuropathy   . Other and unspecified hyperlipidemia   . Unspecified essential hypertension   . Arteriovenous fistula, acquired   . Edema   . Vasectomy status   . Obstructive sleep apnea (adult) (pediatric)    Past Surgical History  Procedure Laterality Date  . Inner ear surgery  1973    for deafness at age 13  . Colonoscopy    . Dialysis shunt placement    . Kidney transplant  08/17/2011    [facility]   . Placement of fistula at [facility] vein specialist    . Left heart catheterization with coronary angiogram N/A 03/21/2011    Procedure: LEFT HEART CATHETERIZATION WITH CORONARY ANGIOGRAM;  Surgeon: [name], MD;  Location: [facility] CATH LAB;  Service: Cardiovascular;  Laterality: N/A;  . Cervical disc surgery      for spinal stenosis  . Nephrectomy      removed transplaned kidney   Family History  Problem Relation Age of Onset  . Adopted: Yes  . Colon cancer Neg Hx   . Esophageal cancer Neg Hx   . Rectal cancer Neg Hx   . Stomach cancer Neg Hx    History  Substance Use Topics  . Smoking status: Former Smoker -- 28 years    Types: Cigarettes    Quit date: 08/04/2011  . Smokeless tobacco: Never  Used     Comment: 1/2 pack every 4 days  . Alcohol Use: No    Review of Systems  Constitutional: Negative for fever and chills.  Respiratory: Positive for cough, shortness of breath and wheezing.   Cardiovascular: Negative for chest pain, palpitations and leg swelling.  Gastrointestinal: Negative for nausea, vomiting, abdominal pain and diarrhea.  Musculoskeletal: Negative for back pain, neck pain and neck stiffness.  Skin: Negative for rash and wound.  Neurological: Negative for dizziness, weakness, light-headedness, numbness and headaches.  All other systems reviewed and are negative.     Allergies  Codeine  Home Medications   Prior to  Admission medications   Medication Sig Start Date End Date Taking? Authorizing Provider  acetaminophen (TYLENOL) 500 MG tablet Take 500 mg by mouth every 4 (four) hours as needed for mild pain.     Historical Provider, MD  albuterol-ipratropium (COMBIVENT) 18-103 MCG/ACT inhaler Inhale 2 puffs into the lungs every 6 (six) hours as needed for wheezing. 07/08/12   Lauree Chandler, NP  amLODipine (NORVASC) 10 MG tablet Take 1 tablet (10 mg total) by mouth daily. 03/29/14   Estill Dooms, MD  aspirin EC 81 MG tablet Take 81 mg by mouth daily.    Historical Provider, MD  B Complex-C-Folic Acid (NEPHRO-VITE PO) Take by mouth.    Historical Provider, MD  b complex-vitamin c-folic acid (NEPHRO-VITE) 0.8 MG TABS tablet TAKE 1 TABLET BY MOUTH EVERY DAY 11/30/13   Estill Dooms, MD  calcium acetate (PHOSLO) 667 MG capsule Take 667 mg by mouth See admin instructions. 4 by mouth with each meal and 2 by mouth with snacks 04/28/13   Estill Dooms, MD  carvedilol (COREG) 25 MG tablet Take 1 tablet (25 mg total) by mouth 2 (two) times daily. Take one tablet in the morning and one tablet in the evening 03/29/14   Estill Dooms, MD  Cholecalciferol 5000 UNITS TABS Take 5,000 Units by mouth every morning.    Historical Provider, MD  diphenhydrAMINE (BENADRYL) 25 MG tablet Take 25 mg by mouth 2 (two) times daily.     Historical Provider, MD  doxazosin (CARDURA) 4 MG tablet Take 0.5 tablet  by mouth in the am and 0.5 tablet by mouth in the pm to regulate blood pressure. 04/01/14   Tiffany Lynelle Doctor, DO  nystatin-triamcinolone (MYCOLOG II) cream Apply twice daily to rash on abdomen 03/17/14   Estill Dooms, MD  ondansetron (ZOFRAN ODT) 4 MG disintegrating tablet Take 1 tablet (4 mg total) by mouth every 8 (eight) hours as needed for nausea or vomiting. 02/17/14   Barton Dubois, MD  pravastatin (PRAVACHOL) 40 MG tablet Take 1 tablet (40 mg total) by mouth every evening. 03/29/14   Estill Dooms, MD  traMADol Veatrice Bourbon) 50 MG tablet  One up to 4 times daly if needed for pain 12/02/13   Estill Dooms, MD   BP 169/90 mmHg  Pulse 98  Temp(Src) 100.8 F (38.2 C) (Oral)  Resp 34  Ht 5' 7.5" (1.715 m)  Wt 236 lb (107.049 kg)  BMI 36.40 kg/m2  SpO2 91% Physical Exam  Constitutional: He is oriented to person, place, and time. He appears well-developed and well-nourished. He appears distressed.  HENT:  Head: Normocephalic and atraumatic.  Mouth/Throat: Oropharynx is clear and moist.  Eyes: EOM are normal. Pupils are equal, round, and reactive to light.  Neck: Normal range of motion. Neck supple. No JVD present.  Cardiovascular: Normal rate and  regular rhythm.   Pulmonary/Chest: He is in respiratory distress. He has wheezes. He has no rales.  Patient with increased respiratory effort. He has decreased air movement throughout with end expiratory wheezing in the bilateral bases  Abdominal: Soft. Bowel sounds are normal. He exhibits no distension and no mass. There is no tenderness. There is no rebound and no guarding.  Musculoskeletal: Normal range of motion. He exhibits no edema or tenderness.  No calf swelling or tenderness.  Neurological: He is alert and oriented to person, place, and time.  Moves all extremities without deficit. Sensation is grossly intact.  Skin: Skin is warm and dry. No rash noted. No erythema.  Psychiatric: He has a normal mood and affect. His behavior is normal.  Nursing note and vitals reviewed.   ED Course  Procedures (including critical care time) Labs Review Labs Reviewed  CBC WITH DIFFERENTIAL/PLATELET - Abnormal; Notable for the following:    RBC 3.40 (*)    Hemoglobin 11.3 (*)    HCT 33.6 (*)    Platelets 76 (*)    Neutrophils Relative % 84 (*)    Lymphocytes Relative 8 (*)    Lymphs Abs 0.4 (*)    All other components within normal limits  COMPREHENSIVE METABOLIC PANEL - Abnormal; Notable for the following:    Glucose, Bld 134 (*)    BUN 36 (*)    Creatinine, Ser 10.06 (*)     GFR calc non Af Amer 5 (*)    GFR calc Af Amer 6 (*)    All other components within normal limits  BRAIN NATRIURETIC PEPTIDE - Abnormal; Notable for the following:    B Natriuretic Peptide 264.0 (*)    All other components within normal limits  CULTURE, BLOOD (ROUTINE X 2)  CULTURE, BLOOD (ROUTINE X 2)  TROPONIN I  I-STAT CG4 LACTIC ACID, ED    Imaging Review Dg Chest Port 1 View  04/06/2014   CLINICAL DATA:  Dyspnea  EXAM: PORTABLE CHEST - 1 VIEW  COMPARISON:  09/08/2013, 08/07/2011  FINDINGS: There are mildly prominent hilar contours on the left, unchanged. The lungs are clear. There are no large effusions. Heart size is unchanged.  IMPRESSION: No active disease.   Electronically Signed   By: Andreas Newport M.D.   On: 04/06/2014 04:47     EKG Interpretation None     ED ECG REPORT   Date: 04/06/2014  Rate: 104  Rhythm: sinus tachycardia  QRS Axis: normal  Intervals: normal  ST/T Wave abnormalities: normal  Conduction Disutrbances:none  Narrative Interpretation:   Old EKG Reviewed: none available  I have personally reviewed the EKG tracing and agree with the computerized printout as noted.  MDM   Final diagnoses:  SOB (shortness of breath)  Acute pulmonary edema    Patient satting 83% on room air. Required 6 L nasal canula to maintain sats above 90%. Will start on BiPAP and give continuous albuterol treatments as well as IV steroids for presumed COPD exacerbation.  Patient with improvement after BiPAP and breathing treatment. Chest x-ray appears to have some vascular congestion but no infiltrates. Mild elevation in BNP. Normal white blood cell count. Low-grade fever in the emergency department. Blood cultures drawn. Discussed with [facility] hospitalists and will admit to step down bed.  Julianne Rice, MD 04/06/14 [phone]

## 2014-04-06 NOTE — Care Management Note (Addendum)
    Page 1 of 2   04/16/2014     1:50:16 PM CARE MANAGEMENT NOTE 04/16/2014  Patient:  Kirk [name], Kirk [name]   Account Number:  [id number]  Date Initiated:  04/06/2014  Documentation initiated by:  CHILDRESS,JESSICA  Subjective/Objective Assessment:   Pt is from home, lives with wife. Pt is independent at baseline. Pt has a CPAP and cane for PRN use.  Pt has no other DME's or [facility] services. Pt goes to HD on MWF at [facility] in [place]. Pt drives himself to HD.     Action/Plan:   Pt plans to discharge home with self care. No CM needs identified at this time. Will cont to follow.   Anticipated DC Date:  04/09/2014   Anticipated DC Plan:  HOME/SELF CARE      DC Planning Services  CM consult      PAC Choice  DURABLE MEDICAL EQUIPMENT   Choice offered to / List presented to:  C-1 Patient   DME arranged  NEBULIZER MACHINE      DME agency  [facility]        Status of service:  Completed, signed off Medicare Important Message given?  YES (If response is "NO", the following Medicare IM given date fields will be blank) Date Medicare IM given:  04/09/2014 Medicare IM given by:  Jolene Provost Date Additional Medicare IM given:  04/16/2014 Additional Medicare IM given by:  JESSICA CHILDRESS  Discharge Disposition:  HOME/SELF CARE  Per UR Regulation:  Reviewed for med. necessity/level of care/duration of stay  If discussed at [place] of Stay Meetings, dates discussed:   04/13/2014  04/16/2014    Comments:  04/16/2014 [name], RN, MSN, CM Pt plans to discharge home today. Neb machine ordered and pt has choosen Psychiatrist for DME needs. [employer] called and faxed pt information. Pt wishes to pick up machine in the store at discharge.  04/09/2014 [place], RN, MSN, CM  04/06/2014 [place], RN, MSN, CM

## 2014-04-06 NOTE — Progress Notes (Signed)
Pt felt panicky with Bipap. Pulled IV out. Respiratory called and Bipap removed. Pt is placed on [place] 6L for now. Pt states he feels better but agreed to allow midlevel to write for him to receive something for his nerves. Order given. Pt states he would like to wait until he goes back on Bipap before taking medication. Will continue to monitor.

## 2014-04-06 NOTE — ED Notes (Signed)
Pt c/o sob since earlier today.

## 2014-04-06 NOTE — Progress Notes (Signed)
[place] for Vancomycin and Cefepime  Indication: rule out pneumonia  Allergies  Allergen Reactions  . Codeine Nausea And Vomiting and Nausea Only    Patient Measurements: Height: 5\' 7"  (170.2 cm) Weight: 230 lb 13.2 oz (104.7 kg) IBW/kg (Calculated) : 66.1  Vital Signs: Temp: 98.1 F (36.7 C) (03/15 1600) Temp Source: Oral (03/15 1600) BP: 175/81 mmHg (03/15 0800) Pulse Rate: 100 (03/15 1200) Intake/Output from previous day:   Intake/Output from this shift: Total I/O In: 120 [P.O.:120] Out: -   Labs:  Recent Labs  04/06/14 0340  WBC 5.9  HGB 11.3*  PLT 76*  CREATININE 10.06*   Estimated Creatinine Clearance: 9.9 mL/min (by C-G formula based on Cr of 10.06). No results for input(s): VANCOTROUGH, VANCOPEAK, VANCORANDOM, GENTTROUGH, GENTPEAK, GENTRANDOM, TOBRATROUGH, TOBRAPEAK, TOBRARND, AMIKACINPEAK, AMIKACINTROU, AMIKACIN in the last 72 hours.   Microbiology: Recent Results (from the past 720 hour(s))  Culture, blood (routine x 2)     Status: None (Preliminary result)   Collection Time: 04/06/14  5:36 AM  Result Value Ref Range Status   Specimen Description BLOOD RIGHT HAND  Final   Special Requests BOTTLES DRAWN AEROBIC AND ANAEROBIC 12CC EACH  Final   Culture NO GROWTH <24 HRS  Final   Report Status PENDING  Incomplete  MRSA PCR Screening     Status: None   Collection Time: 04/06/14  6:23 AM  Result Value Ref Range Status   MRSA by PCR NEGATIVE NEGATIVE Final    Comment:        The GeneXpert MRSA Assay (FDA approved for NASAL specimens only), is one component of a comprehensive MRSA colonization surveillance program. It is not intended to diagnose MRSA infection nor to guide or monitor treatment for MRSA infections.   Culture, blood (routine x 2)     Status: None (Preliminary result)   Collection Time: 04/06/14  7:44 AM  Result Value Ref Range Status   Specimen Description BLOOD RIGHT HAND  Final   Special  Requests   Final    BOTTLES DRAWN AEROBIC AND ANAEROBIC AEB=10CC ANA=8CC   Culture NO GROWTH <24 HRS  Final   Report Status PENDING  Incomplete    Anti-infectives    Start     Dose/Rate Route Frequency Ordered Stop   04/06/14 1800  cefTRIAXone (ROCEPHIN) 1 g in dextrose 5 % 50 mL IVPB - Premix  Status:  Discontinued     1 g 100 mL/hr over 30 Minutes Intravenous Every 24 hours 04/06/14 1759 04/06/14 1804   04/06/14 1800  azithromycin (ZITHROMAX) 500 mg in dextrose 5 % 250 mL IVPB  Status:  Discontinued     500 mg 250 mL/hr over 60 Minutes Intravenous Every 24 hours 04/06/14 1759 04/06/14 1804      Assessment: Okay for Protocol, ESRD patient to be treated for HCAP.  HD schedule pending.  Goal of Therapy:  Eradicate infection.  Pre-Hemodialysis Vancomycin level goal range =15-25 mcg/ml  Plan:  Vancomycin 1500mg  IV x 1 and Cefepime 2gm IV x 1. F/U in AM for HD plans. Measure antibiotic drug levels at steady state Follow up culture results  Pricilla Larsson 04/06/2014,6:19 PM

## 2014-04-06 NOTE — H&P (Signed)
PCP:   Estill Dooms, MD   Chief Complaint:  Shortness of breath  HPI: 54 year old male who   has a past medical history of Anxiety; Chronic headaches; Chronic kidney disease (CKD); Dialysis care (02/06/2010); HTN (hypertension); Sleep apnea; T Kidney replaced by transplant; Acute edema of lung, unspecified; Unspecified constipation; Other chronic nonalcoholic liver disease; Localization-related (focal) (partial) epilepsy and epileptic syndromes with complex partial seizures, without mention of intractable epilepsy; Tension headache; Dermatophytosis of the body; Gout, unspecified; Pain in joint, upper arm; Pain in joint, lower leg; Other nonspecific abnormal serum enzyme levels; Essential hypertension, benign; Acute, but ill-defined, cerebrovascular disease; Memory loss; Hypotension, unspecified; Hypertrophy of prostate without urinary obstruction and other lower urinary tract symptoms (LUTS); Renal dialysis status Today patient came to the ED after started having coughing with shortness of breath in the afternoon. He started soon after patient returned from dialysis and became progressive with worsening cough and shortness of breath so patient came to the hospital for further evaluation. Patient denies any chest pain or abdominal pain no nausea vomiting or diarrhea. In the ED patient was started on BiPAP, as the oxygen saturation was in 80s. Chest x-ray does not show pneumonia, patient had temp of 100.8 with normal white count.  Allergies:   Allergies  Allergen Reactions  . Codeine Nausea And Vomiting and Nausea Only      Past Medical History  Diagnosis Date  . Anxiety   . Chronic headaches   . Chronic kidney disease (CKD)   . Dialysis care 02/06/2010  . HTN (hypertension)   . Sleep apnea     wears the CPAP  . Type II or unspecified type diabetes mellitus with renal manifestations, uncontrolled   . Shortness of breath   . Impotence of organic origin   . Other malaise and fatigue     . Debility, unspecified   . Cellulitis and abscess of trunk   . Kidney replaced by transplant   . Acute edema of lung, unspecified   . Unspecified constipation   . Other chronic nonalcoholic liver disease   . Localization-related (focal) (partial) epilepsy and epileptic syndromes with complex partial seizures, without mention of intractable epilepsy   . Tension headache   . Dermatophytosis of the body   . Gout, unspecified   . Pain in joint, upper arm   . Pain in joint, lower leg   . Other nonspecific abnormal serum enzyme levels   . Essential hypertension, benign   . Acute, but ill-defined, cerebrovascular disease   . Memory loss   . Hypotension, unspecified   . Hypertrophy of prostate without urinary obstruction and other lower urinary tract symptoms (LUTS)   . Renal dialysis status(V45.11)   . End stage renal disease   . Insomnia, unspecified   . Secondary hyperparathyroidism (of renal origin)   . Lumbago   . Unspecified vitamin D deficiency   . Carpal tunnel syndrome   . 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)   . Unspecified hereditary and idiopathic peripheral neuropathy   . Other and unspecified hyperlipidemia   . Unspecified essential hypertension   . Arteriovenous fistula, acquired   . Edema   . Vasectomy status   . Obstructive sleep apnea (adult) (pediatric)     Past Surgical History  Procedure Laterality Date  . Inner ear surgery  1973    for deafness at age 50  . Colonoscopy    . Dialysis shunt placement    . Kidney transplant  08/17/2011  [facility]   . Placement of fistula at [facility] vein specialist    . Left heart catheterization with coronary angiogram N/A 03/21/2011    Procedure: LEFT HEART CATHETERIZATION WITH CORONARY ANGIOGRAM;  Surgeon: [name], MD;  Location: [facility] CATH LAB;  Service: Cardiovascular;  Laterality: N/A;  . Cervical disc surgery      for spinal stenosis  . Nephrectomy      removed transplaned kidney    Prior to  Admission medications   Medication Sig Start Date End Date Taking? Authorizing Provider  acetaminophen (TYLENOL) 500 MG tablet Take 500 mg by mouth every 4 (four) hours as needed for mild pain.     Historical Provider, MD  albuterol-ipratropium (COMBIVENT) 18-103 MCG/ACT inhaler Inhale 2 puffs into the lungs every 6 (six) hours as needed for wheezing. 07/08/12   Lauree Chandler, NP  amLODipine (NORVASC) 10 MG tablet Take 1 tablet (10 mg total) by mouth daily. 03/29/14   Estill Dooms, MD  aspirin EC 81 MG tablet Take 81 mg by mouth daily.    Historical Provider, MD  B Complex-C-Folic Acid (NEPHRO-VITE PO) Take by mouth.    Historical Provider, MD  b complex-vitamin c-folic acid (NEPHRO-VITE) 0.8 MG TABS tablet TAKE 1 TABLET BY MOUTH EVERY DAY 11/30/13   Estill Dooms, MD  calcium acetate (PHOSLO) 667 MG capsule Take 667 mg by mouth See admin instructions. 4 by mouth with each meal and 2 by mouth with snacks 04/28/13   Estill Dooms, MD  carvedilol (COREG) 25 MG tablet Take 1 tablet (25 mg total) by mouth 2 (two) times daily. Take one tablet in the morning and one tablet in the evening 03/29/14   Estill Dooms, MD  Cholecalciferol 5000 UNITS TABS Take 5,000 Units by mouth every morning.    Historical Provider, MD  diphenhydrAMINE (BENADRYL) 25 MG tablet Take 25 mg by mouth 2 (two) times daily.     Historical Provider, MD  doxazosin (CARDURA) 4 MG tablet Take 0.5 tablet  by mouth in the am and 0.5 tablet by mouth in the pm to regulate blood pressure. 04/01/14   Tiffany Lynelle Doctor, DO  nystatin-triamcinolone (MYCOLOG II) cream Apply twice daily to rash on abdomen 03/17/14   Estill Dooms, MD  ondansetron (ZOFRAN ODT) 4 MG disintegrating tablet Take 1 tablet (4 mg total) by mouth every 8 (eight) hours as needed for nausea or vomiting. 02/17/14   Barton Dubois, MD  pravastatin (PRAVACHOL) 40 MG tablet Take 1 tablet (40 mg total) by mouth every evening. 03/29/14   Estill Dooms, MD  traMADol Veatrice Bourbon) 50 MG tablet  One up to 4 times daly if needed for pain 12/02/13   Estill Dooms, MD    Social History:  reports that he quit smoking about 2 years ago. His smoking use included Cigarettes. He quit after 28 years of use. He has never used smokeless tobacco. He reports that he does not drink alcohol or use illicit drugs.  Family History  Problem Relation Age of Onset  . Adopted: Yes  . Colon cancer Neg Hx   . Esophageal cancer Neg Hx   . Rectal cancer Neg Hx   . Stomach cancer Neg Hx      All the positives are listed in BOLD  Review of Systems:  HEENT: Headache, blurred vision, runny nose, sore throat Neck: Hypothyroidism, hyperthyroidism,,lymphadenopathy Chest : Shortness of breath, history of COPD, Asthma Heart : Chest pain, history of coronary arterey  disease GI:  Nausea, vomiting, diarrhea, constipation, GERD GU: Dysuria, urgency, frequency of urination, hematuria Neuro: Stroke, seizures, syncope Psych: Depression, anxiety, hallucinations   Physical Exam: Blood pressure 144/69, pulse 96, temperature 100.8 F (38.2 C), temperature source Oral, resp. rate 24, height 5' 7.5" (1.715 m), weight 107.049 kg (236 lb), SpO2 99 %. Constitutional:   Patient is a well-developed and well-nourished male* in no acute distress and cooperative with exam. Head: Normocephalic and atraumatic, on BiPAP Eyes: PERRL, EOMI, conjunctivae normal Neck: Supple, No Thyromegaly Cardiovascular: RRR, S1 normal, S2 normal Pulmonary/Chest: Diminished breath sounds bilaterally Abdominal: Soft. Non-tender, non-distended, bowel sounds are normal, no masses, organomegaly, or guarding present.  Neurological: A&O x3, Strength is normal and symmetric bilaterally, cranial nerve II-XII are grossly intact, no focal motor deficit, sensory intact to light touch bilaterally.  Extremities : No Cyanosis, Clubbing or Edema  Labs on Admission:  Basic Metabolic Panel:  Recent Labs Lab 04/06/14 0340  NA 140  K 4.1  CL 98  CO2  28  GLUCOSE 134*  BUN 36*  CREATININE 10.06*  CALCIUM 8.7   Liver Function Tests:  Recent Labs Lab 04/06/14 0340  AST 25  ALT 27  ALKPHOS 53  BILITOT 1.0  PROT 8.1  ALBUMIN 4.1   No results for input(s): LIPASE, AMYLASE in the last 168 hours. No results for input(s): AMMONIA in the last 168 hours. CBC:  Recent Labs Lab 04/06/14 0340  WBC 5.9  NEUTROABS 5.0  HGB 11.3*  HCT 33.6*  MCV 98.8  PLT 76*   Cardiac Enzymes:  Recent Labs Lab 04/06/14 0340  TROPONINI <0.03    BNP (last 3 results)  Recent Labs  04/06/14 0340  BNP 264.0*    ProBNP (last 3 results) No results for input(s): PROBNP in the last 8760 hours.  CBG: No results for input(s): GLUCAP in the last 168 hours.  Radiological Exams on Admission: Dg Chest Port 1 View  04/06/2014   CLINICAL DATA:  Dyspnea  EXAM: PORTABLE CHEST - 1 VIEW  COMPARISON:  09/08/2013, 08/07/2011  FINDINGS: There are mildly prominent hilar contours on the left, unchanged. The lungs are clear. There are no large effusions. Heart size is unchanged.  IMPRESSION: No active disease.   Electronically Signed   By: Andreas Newport M.D.   On: 04/06/2014 04:47     Assessment/Plan Principal Problem:   Pulmonary edema Active Problems:   HTN (hypertension)   History of kidney transplant   End-stage renal disease on hemodialysis   Dyspnea  Dyspnea Likely due to pulmonary edema as chest x-ray shows increased vascular markings. Continue BiPAP. DuoNeb nebulizers every 6 hours with albuterol when necessary Will consult nephrology for hemodialysis  Fever Patient had temp of 100.8, blood cultures 2 have been obtained. White count within normal range. Chest x-ray does not show pneumonia. Will not start antibiotics at this time, could be viral in origin. If patient continues to spike temperature during the hospital stay, consider IV antibiotics. Follow blood cultures. Will also check UA and culture.  End-stage renal disease Patient  has ESRD on hemodialysis, will consult nephrology for dialysis  DVT prophylaxis Heparin   Code status: Full code  Family discussion: Admission, patients condition and plan of care including tests being ordered have been discussed with the patient and his mother at bedside who indicate understanding and agree with the plan and Code Status.   Time Spent on Admission: 55 minutes  LAMA,GAGAN S [facility] Pager: [phone] 04/06/2014, 6:02 AM  If  7PM-7AM, please contact night-coverage  [email or web address]  Password [email or web address]

## 2014-04-06 NOTE — Consult Note (Signed)
Kirk [name] MRN: [id number] DOB/AGE: 04-04-1960 54 y.o. Primary Care Physician:GREEN, [name], MD Admit date: 04/06/2014 Chief Complaint:  Chief Complaint  Patient presents with  . Shortness of Breath   HPI: Pt is 54 year old male with past medical hx of ESRD who came to ER with c/o dyspnea.  HPI dates back to yesterday when pt had sudden on set of dyspnea.Upon evaluation in Er pt was found to be hypoxic with pulse ox into 80's and was admitted for further care. Pt seen in ICU with bipap. Pt  c/o cough. Pt  c/o nausea Pt c/o back ache NO c/o abdominal pain No c/o diarrhea NO c/o syncope NO c/o recent travel.  Past Medical History  Diagnosis Date  . Anxiety   . Chronic headaches   . Chronic kidney disease (CKD)   . Dialysis care 02/06/2010  . HTN (hypertension)   . Sleep apnea     wears the CPAP  . Type II or unspecified type diabetes mellitus with renal manifestations, uncontrolled   . Shortness of breath   . Impotence of organic origin   . Other malaise and fatigue   . Debility, unspecified   . Cellulitis and abscess of trunk   . Kidney replaced by transplant   . Acute edema of lung, unspecified   . Unspecified constipation   . Other chronic nonalcoholic liver disease   . Localization-related (focal) (partial) epilepsy and epileptic syndromes with complex partial seizures, without mention of intractable epilepsy   . Tension headache   . Dermatophytosis of the body   . Gout, unspecified   . Pain in joint, upper arm   . Pain in joint, lower leg   . Other nonspecific abnormal serum enzyme levels   . Essential hypertension, benign   . Acute, but ill-defined, cerebrovascular disease   . Memory loss   . Hypotension, unspecified   . Hypertrophy of prostate without urinary obstruction and other lower urinary tract symptoms (LUTS)   . Renal dialysis status(V45.11)   . End stage renal disease   . Insomnia, unspecified   . Secondary hyperparathyroidism (of renal origin)    . Lumbago   . Unspecified vitamin D deficiency   . Carpal tunnel syndrome   . 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)   . Unspecified hereditary and idiopathic peripheral neuropathy   . Other and unspecified hyperlipidemia   . Unspecified essential hypertension   . Arteriovenous fistula, acquired   . Edema   . Vasectomy status   . Obstructive sleep apnea (adult) (pediatric)        Family History  Problem Relation Age of Onset  . Adopted: Yes  . Colon cancer Neg Hx   . Esophageal cancer Neg Hx   . Rectal cancer Neg Hx   . Stomach cancer Neg Hx     Social History:  reports that he quit smoking about 2 years ago. His smoking use included Cigarettes. He quit after 28 years of use. He has never used smokeless tobacco. He reports that he does not drink alcohol or use illicit drugs.   Allergies:  Allergies  Allergen Reactions  . Codeine Nausea And Vomiting and Nausea Only    Medications Prior to Admission  Medication Sig Dispense Refill  . acetaminophen (TYLENOL) 500 MG tablet Take 500 mg by mouth every 4 (four) hours as needed for mild pain.     Marland Kitchen albuterol-ipratropium (COMBIVENT) 18-103 MCG/ACT inhaler Inhale 2 puffs into the lungs every 6 (six) hours as needed  for wheezing. 14.7 g 3  . amLODipine (NORVASC) 10 MG tablet Take 1 tablet (10 mg total) by mouth daily. 90 tablet 1  . aspirin EC 81 MG tablet Take 81 mg by mouth daily.    Marland Kitchen b complex-vitamin c-folic acid (NEPHRO-VITE) 0.8 MG TABS tablet TAKE 1 TABLET BY MOUTH EVERY DAY 30 tablet 5  . carvedilol (COREG) 25 MG tablet Take 1 tablet (25 mg total) by mouth 2 (two) times daily. Take one tablet in the morning and one tablet in the evening 180 tablet 1  . Cholecalciferol 5000 UNITS TABS Take 5,000 Units by mouth every morning.    . diphenhydrAMINE (BENADRYL) 25 MG tablet Take 50 mg by mouth 2 (two) times daily.     Marland Kitchen doxazosin (CARDURA) 4 MG tablet Take 0.5 tablet  by mouth in the am and 0.5 tablet by mouth in the pm to  regulate blood pressure. (Patient taking differently: Take 2 mg by mouth 2 (two) times daily. Take 0.5 tablet  by mouth in the am and 0.5 tablet by mouth in the pm to regulate blood pressure.) 90 tablet 1  . lanthanum (FOSRENOL) 1000 MG chewable tablet Chew 500-1,000 mg by mouth 4 (four) times daily. 1 tablet three times daily with each meal and 1/2 tablet with snack.    . nystatin-triamcinolone (MYCOLOG II) cream Apply twice daily to rash on abdomen (Patient taking differently: Apply 1 application topically 2 (two) times daily. Apply twice daily to rash on abdomen) 30 g 0  . ondansetron (ZOFRAN ODT) 4 MG disintegrating tablet Take 1 tablet (4 mg total) by mouth every 8 (eight) hours as needed for nausea or vomiting. 20 tablet 0  . pravastatin (PRAVACHOL) 40 MG tablet Take 1 tablet (40 mg total) by mouth every evening. 90 tablet 1  . traMADol (ULTRAM) 50 MG tablet One up to 4 times daly if needed for pain (Patient taking differently: Take 50 mg by mouth 4 (four) times daily as needed for moderate pain. One up to 4 times daly if needed for pain) 100 tablet 3  . traMADol (ULTRAM-ER) 100 MG 24 hr tablet Take 100 mg by mouth 4 (four) times daily as needed for pain.         [id number] from the symptoms mentioned above,there are no other symptoms referable to all systems reviewed.  Marland Kitchen amLODipine  10 mg Oral Daily  . aspirin EC  81 mg Oral Daily  . carvedilol  25 mg Oral BID  . doxazosin  2 mg Oral Q12H  . heparin  5,000 Units Subcutaneous 3 times per day  . ipratropium-albuterol  3 mL Nebulization Q6H  . lanthanum  1,000 mg Oral TID WC  . pravastatin  40 mg Oral QPM  . sodium chloride  3 mL Intravenous Q12H        Physical Exam: Vital signs in last 24 hours: Temp:  [98.1 F (36.7 C)-100.8 F (38.2 C)] 98.1 F (36.7 C) (03/15 1600) Pulse Rate:  [94-104] 100 (03/15 1200) Resp:  [24-34] 27 (03/15 1200) BP: (144-179)/(69-90) 175/81 mmHg (03/15 0800) SpO2:  [83 %-99 %] 91 % (03/15  1511) FiO2 (%):  [40 %-60 %] 44 % (03/15 1200) Weight:  [230 lb 13.2 oz (104.7 kg)-236 lb (107.049 kg)] 230 lb 13.2 oz (104.7 kg) (03/15 0630) Weight change:     Intake/Output from previous day:   Total I/O In: 120 [P.O.:120] Out: -    Physical Exam: General- pt is awake,alert, oriented to time place and  person Resp- mild REsp distress,on Bipap, Rhonchi+ CVS- S1S2 regular ij rate and rhythm GIT- BS+, soft, NT, ND, surgical scar for transplant present EXT- NO LE Edema, Cyanosis CNS- CN 2-12 grossly intact. Moving all 4 extremities Psych- normal mod and affect Access-AVF   Lab Results: CBC  Recent Labs  04/06/14 0340  WBC 5.9  HGB 11.3*  HCT 33.6*  PLT 76*    BMET  Recent Labs  04/06/14 0340  NA 140  K 4.1  CL 98  CO2 28  GLUCOSE 134*  BUN 36*  CREATININE 10.06*  CALCIUM 8.7    MICRO Recent Results (from the past 240 hour(s))  Culture, blood (routine x 2)     Status: None (Preliminary result)   Collection Time: 04/06/14  5:36 AM  Result Value Ref Range Status   Specimen Description BLOOD RIGHT HAND  Final   Special Requests BOTTLES DRAWN AEROBIC AND ANAEROBIC 12CC EACH  Final   Culture NO GROWTH <24 HRS  Final   Report Status PENDING  Incomplete  MRSA PCR Screening     Status: None   Collection Time: 04/06/14  6:23 AM  Result Value Ref Range Status   MRSA by PCR NEGATIVE NEGATIVE Final    Comment:        The GeneXpert MRSA Assay (FDA approved for NASAL specimens only), is one component of a comprehensive MRSA colonization surveillance program. It is not intended to diagnose MRSA infection nor to guide or monitor treatment for MRSA infections.   Culture, blood (routine x 2)     Status: None (Preliminary result)   Collection Time: 04/06/14  7:44 AM  Result Value Ref Range Status   Specimen Description BLOOD RIGHT HAND  Final   Special Requests   Final    BOTTLES DRAWN AEROBIC AND ANAEROBIC AEB=10CC ANA=8CC   Culture NO GROWTH <24 HRS  Final    Report Status PENDING  Incomplete      Lab Results  Component Value Date   PTH 93.1* 01/19/2010   CALCIUM 8.7 04/06/2014   CAION 1.02* 09/08/2013   PHOS 3.0 08/09/2011   CT chest FINDINGS: The lungs are well aerated bilaterally with patchy lower lobe infiltrates slightly worse on the left than the right. A few tiny partially calcified nodules are identified.  Impression: 1)Renal  ESRD on Hd               On MWF schedule                Pt was last dialyzed yesterday                NO need of Hd today                will dialyze in am.               Hx of failed transplant  2)HTN  Medication- On Calcium Channel Blockers On Alpha and beta Blockers   3)Anemia HGb at goal (9--11)   4)CKD Mineral-Bone Disorder Secondary Hyperparathyroidism present  Phosphorus at goal.   5)Id- admitted with Pneumonia Primary MD following  6)Electrolytes Normokalemic NOrmonatremic   7)Acid base Co2 at goal     Plan:  Agree with current abx regimen NO need of Hd today Will dialyze in am       Roselie Cirigliano S 04/06/2014, 5:48 PM

## 2014-04-07 DIAGNOSIS — J810 Acute pulmonary edema: Secondary | ICD-10-CM

## 2014-04-07 DIAGNOSIS — J189 Pneumonia, unspecified organism: Secondary | ICD-10-CM

## 2014-04-07 DIAGNOSIS — Z992 Dependence on renal dialysis: Secondary | ICD-10-CM

## 2014-04-07 DIAGNOSIS — N186 End stage renal disease: Secondary | ICD-10-CM

## 2014-04-07 LAB — URINE CULTURE

## 2014-04-07 LAB — CBC
HEMATOCRIT: 30.1 % — AB (ref 39.0–52.0)
Hemoglobin: 10.2 g/dL — ABNORMAL LOW (ref 13.0–17.0)
MCH: 33.2 pg (ref 26.0–34.0)
MCHC: 33.9 g/dL (ref 30.0–36.0)
MCV: 98 fL (ref 78.0–100.0)
Platelets: 79 10*3/uL — ABNORMAL LOW (ref 150–400)
RBC: 3.07 MIL/uL — ABNORMAL LOW (ref 4.22–5.81)
RDW: 12.6 % (ref 11.5–15.5)
WBC: 6.4 10*3/uL (ref 4.0–10.5)

## 2014-04-07 LAB — COMPREHENSIVE METABOLIC PANEL
ALBUMIN: 3.6 g/dL (ref 3.5–5.2)
ALK PHOS: 52 U/L (ref 39–117)
ALT: 24 U/L (ref 0–53)
ANION GAP: 15 (ref 5–15)
AST: 23 U/L (ref 0–37)
BUN: 68 mg/dL — ABNORMAL HIGH (ref 6–23)
CO2: 24 mmol/L (ref 19–32)
Calcium: 8.6 mg/dL (ref 8.4–10.5)
Chloride: 97 mmol/L (ref 96–112)
Creatinine, Ser: 13.58 mg/dL — ABNORMAL HIGH (ref 0.50–1.35)
GFR calc Af Amer: 4 mL/min — ABNORMAL LOW (ref >90)
GFR calc non Af Amer: 4 mL/min — ABNORMAL LOW (ref >90)
Glucose, Bld: 133 mg/dL — ABNORMAL HIGH (ref 70–99)
POTASSIUM: 5 mmol/L (ref 3.5–5.1)
SODIUM: 136 mmol/L (ref 135–145)
Total Bilirubin: 0.8 mg/dL (ref 0.3–1.2)
Total Protein: 7 g/dL (ref 6.0–8.3)

## 2014-04-07 LAB — LEGIONELLA ANTIGEN, URINE

## 2014-04-07 LAB — HIV ANTIBODY (ROUTINE TESTING W REFLEX): HIV Screen 4th Generation wRfx: NONREACTIVE

## 2014-04-07 MED ORDER — ONDANSETRON HCL 4 MG/2ML IJ SOLN
4.0000 mg | Freq: Four times a day (QID) | INTRAMUSCULAR | Status: DC | PRN
  Administered 2014-04-07 – 2014-04-13 (×6): 4 mg via INTRAVENOUS
  Filled 2014-04-07 (×6): qty 2

## 2014-04-07 MED ORDER — PENTAFLUOROPROP-TETRAFLUOROETH EX AERO
1.0000 "application " | INHALATION_SPRAY | CUTANEOUS | Status: DC | PRN
  Filled 2014-04-07: qty 30

## 2014-04-07 MED ORDER — NEPRO/CARBSTEADY PO LIQD: 237.0000 mL | ORAL | Status: DC | PRN

## 2014-04-07 MED ORDER — VANCOMYCIN HCL IN DEXTROSE 1-5 GM/200ML-% IV SOLN
1000.0000 mg | INTRAVENOUS | Status: DC
  Administered 2014-04-07 – 2014-04-12 (×3): 1000 mg via INTRAVENOUS
  Filled 2014-04-07 (×3): qty 200

## 2014-04-07 MED ORDER — HEPARIN SODIUM (PORCINE) 1000 UNIT/ML DIALYSIS
20.0000 [IU]/kg | INTRAMUSCULAR | Status: DC | PRN
  Administered 2014-04-07 – 2014-04-14 (×3): 2100 [IU] via INTRAVENOUS_CENTRAL
  Filled 2014-04-07 (×5): qty 3

## 2014-04-07 MED ORDER — SODIUM CHLORIDE 0.9 % IV SOLN: 100.0000 mL | INTRAVENOUS | Status: DC | PRN

## 2014-04-07 MED ORDER — SODIUM CHLORIDE 0.9 % IV SOLN
100.0000 mL | INTRAVENOUS | Status: DC | PRN
Start: 2014-04-07 — End: 2014-04-17

## 2014-04-07 MED ORDER — LIDOCAINE-PRILOCAINE 2.5-2.5 % EX CREA
1.0000 "application " | TOPICAL_CREAM | CUTANEOUS | Status: DC | PRN
  Filled 2014-04-07: qty 5

## 2014-04-07 MED ORDER — HEPARIN SODIUM (PORCINE) 1000 UNIT/ML DIALYSIS
1000.0000 [IU] | INTRAMUSCULAR | Status: DC | PRN
  Filled 2014-04-07 (×2): qty 1

## 2014-04-07 MED ORDER — DEXTROSE 5 % IV SOLN
2.0000 g | INTRAVENOUS | Status: DC
  Administered 2014-04-07 – 2014-04-12 (×3): 2 g via INTRAVENOUS
  Filled 2014-04-07 (×3): qty 2

## 2014-04-07 MED ORDER — ALTEPLASE 2 MG IJ SOLR
2.0000 mg | Freq: Once | INTRAMUSCULAR | Status: AC | PRN
  Filled 2014-04-07: qty 2

## 2014-04-07 MED ORDER — LIDOCAINE HCL (PF) 1 % IJ SOLN: 5.0000 mL | INTRAMUSCULAR | Status: DC | PRN

## 2014-04-07 NOTE — Progress Notes (Signed)
Kirk [name]  MRN: [id number]  DOB/AGE: [birth year] 54 y.o.  Primary Care Physician:GREEN, [name], MD  Admit date: 04/06/2014  Chief Complaint:  Chief Complaint  Patient presents with  . Shortness of Breath    S-Pt presented on  04/06/2014 with  Chief Complaint  Patient presents with  . Shortness of Breath  .    Pt today feels better. Pt now off Bipap.  Meds . amLODipine  10 mg Oral Daily  . aspirin EC  81 mg Oral Daily  . carvedilol  25 mg Oral BID  . ceFEPime (MAXIPIME) IV  2 g Intravenous Q M,W,F-HD  . doxazosin  2 mg Oral Q12H  . heparin  5,000 Units Subcutaneous 3 times per day  . ipratropium-albuterol  3 mL Nebulization Q6H  . lanthanum  1,000 mg Oral TID WC  . pravastatin  40 mg Oral QPM  . sodium chloride  3 mL Intravenous Q12H  . vancomycin  1,000 mg Intravenous Q M,W,F-HD      Physical Exam: Vital signs in last 24 hours: Temp:  [98.1 F (36.7 C)-98.6 F (37 C)] 98.2 F (36.8 C) (03/16 0814) Pulse Rate:  [76-103] 83 (03/16 0930) Resp:  [16-34] 16 (03/16 0930) BP: (145-172)/(66-86) 145/79 mmHg (03/16 0930) SpO2:  [88 %-97 %] 93 % (03/16 0814) FiO2 (%):  [44 %-50 %] 50 % (03/15 1952) Weight:  [232 lb 12.9 oz (105.6 kg)] 232 lb 12.9 oz (105.6 kg) (03/16 0814) Weight change: -3 lb 3.1 oz (-1.449 kg)    Intake/Output from previous day: 03/15 0701 - 03/16 0700 In: 120 [P.O.:120] Out: -      Physical Exam: General- pt is awake,alert, oriented to time place and person Resp- No REsp distress, Rhonchi+ CVS- S1S2 regular in rate and rhythm GIT- BS+, soft, NT, ND, surgical scar for transplant present EXT- NO LE Edema, Cyanosis Access-AVF+   Lab Results: CBC  Recent Labs  04/06/14 0340 04/07/14 0508  WBC 5.9 6.4  HGB 11.3* 10.2*  HCT 33.6* 30.1*  PLT 76* 79*    BMET  Recent Labs  04/06/14 0340 04/07/14 0508  NA 140 136  K 4.1 5.0  CL 98 97  CO2 28 24  GLUCOSE 134* 133*  BUN 36* 68*  CREATININE 10.06* 13.58*  CALCIUM 8.7 8.6     MICRO Recent Results (from the past 240 hour(s))  Culture, blood (routine x 2)     Status: None (Preliminary result)   Collection Time: 04/06/14  5:36 AM  Result Value Ref Range Status   Specimen Description BLOOD RIGHT HAND  Final   Special Requests BOTTLES DRAWN AEROBIC AND ANAEROBIC 12CC EACH  Final   Culture NO GROWTH 1 DAY  Final   Report Status PENDING  Incomplete  MRSA PCR Screening     Status: None   Collection Time: 04/06/14  6:23 AM  Result Value Ref Range Status   MRSA by PCR NEGATIVE NEGATIVE Final    Comment:        The GeneXpert MRSA Assay (FDA approved for NASAL specimens only), is one component of a comprehensive MRSA colonization surveillance program. It is not intended to diagnose MRSA infection nor to guide or monitor treatment for MRSA infections.   Culture, blood (routine x 2)     Status: None (Preliminary result)   Collection Time: 04/06/14  7:44 AM  Result Value Ref Range Status   Specimen Description BLOOD RIGHT HAND  Final   Special Requests   Final  BOTTLES DRAWN AEROBIC AND ANAEROBIC AEB=10CC ANA=8CC   Culture NO GROWTH 1 DAY  Final   Report Status PENDING  Incomplete      Lab Results  Component Value Date   PTH 93.1* 01/19/2010   CALCIUM 8.6 04/07/2014   CAION 1.02* 09/08/2013   PHOS 3.0 08/09/2011               Impression: 1)Renal ESRD on Hd  On MWF schedule Pt on Hd  2)HTN  Medication- On Calcium Channel Blockers On Alpha and beta Blockers   3)Anemia HGb at goal (9--11)   4)CKD Mineral-Bone Disorder Secondary Hyperparathyroidism present  Phosphorus at goal.   5)Id- admitted with Pneumonia Primary MD following  6)Electrolytes Normokalemic NOrmonatremic   7)Acid base Co2 at goal  Plan:  Pt is currently being dialyzed     Shriley Joffe S 04/07/2014, 10:13 AM

## 2014-04-07 NOTE — Progress Notes (Signed)
Patient had temp of 102.4 at time of transfer to floor today, tylenol given inn ICU.  Recheck temp is 98.4  MD made aware.

## 2014-04-07 NOTE — Progress Notes (Signed)
Inpatient Diabetes Program Recommendations  AACE/ADA: New Consensus Statement on Inpatient Glycemic Control (2013)  Target Ranges:  Prepandial:   less than 140 mg/dL      Peak postprandial:   less than 180 mg/dL (1-2 hours)      Critically ill patients:  140 - 180 mg/dL  Results for ZOEL, BASSANI (MRN [id number]) as of 04/07/2014 12:12  Ref. Range 04/06/2014 07:32 04/06/2014 11:14 04/06/2014 16:29 04/06/2014 21:07  Glucose-Capillary Latest Range: 70-99 mg/dL 171 (H) 190 (H) 231 (H) 191 (H)   Results for ARJAY, HIBBERD (MRN [id number]) as of 04/07/2014 12:12  Ref. Range 11/26/2013 09:14  Hemoglobin A1C Latest Range: 4.8-5.6 % 5.7 (H)   Diabetes history: DM2 Outpatient Diabetes medications: None Current orders for Inpatient glycemic control: None  Inpatient Diabetes Program Recommendations Correction (SSI): Please consider ordering CBGs with Novolog correction scale ACHS. HgbA1C: Last A1C in the chart was 5.7% on 11/26/2013. Please consider ordering an A1C to evaluate glycemic control over the past 2-3 months.  Thanks, Barnie Alderman, RN, MSN, CCRN, CDE Diabetes Coordinator Inpatient Diabetes Program [phone] (Team Pager) [phone] ([facility] office) [phone] [facility] office)

## 2014-04-07 NOTE — Progress Notes (Signed)
[facility] PROGRESS NOTE  Kirk CARRISALEZ [id number] DOB: [birth year] DOA: 04/06/2014 PCP: Estill Dooms, MD  Assessment/Plan: Acute respiratory Failure -Improved. -Saturating well on [place]. -Believe 2/2 HCAP as well as mild fluid overload given his ESRD state. -CT negative for PE.  HCAP -Vanc/Cefepime day 2. -All cx data remains negative to date. -Afebrile.  ESRD -HD MWF. -Appreciate renal input and recommendations.  Code Status: Full Code Family Communication: Wife at bedside updated on plan of care  Disposition Plan: Transfer to floor today   Consultants:  None   Antibiotics:  Vancomycin  Cefepime   Subjective: Feels much better today, less back pain and less SOB.  Objective: Filed Vitals:   04/07/14 1100 04/07/14 1144 04/07/14 1202 04/07/14 1258  BP: 155/73  158/67   Pulse: 79     Temp:    98.4 F (36.9 C)  TempSrc:    Oral  Resp: 16     Height:      Weight:      SpO2:  94%     No intake or output data in the 24 hours ending 04/07/14 1336 Filed Weights   04/06/14 0630 04/07/14 0500 04/07/14 0814  Weight: 104.7 kg (230 lb 13.2 oz) 105.6 kg (232 lb 12.9 oz) 105.6 kg (232 lb 12.9 oz)    Exam:   General:  AA Ox3, lying in bed, currently receiving HD.  Cardiovascular: RRR  Respiratory: CTA B  Abdomen: S/NT/ND/+BS  Extremities: trace bilateral edema   Neurologic:  Non-focal  Data Reviewed: Basic Metabolic Panel:  Recent Labs Lab 04/06/14 0340 04/07/14 0508  NA 140 136  K 4.1 5.0  CL 98 97  CO2 28 24  GLUCOSE 134* 133*  BUN 36* 68*  CREATININE 10.06* 13.58*  CALCIUM 8.7 8.6   Liver Function Tests:  Recent Labs Lab 04/06/14 0340 04/07/14 0508  AST 25 23  ALT 27 24  ALKPHOS 53 52  BILITOT 1.0 0.8  PROT 8.1 7.0  ALBUMIN 4.1 3.6   No results for input(s): LIPASE, AMYLASE in the last 168 hours. No results for input(s): AMMONIA in the last 168 hours. CBC:  Recent Labs Lab 04/06/14 0340 04/07/14 0508    WBC 5.9 6.4  NEUTROABS 5.0  --   HGB 11.3* 10.2*  HCT 33.6* 30.1*  MCV 98.8 98.0  PLT 76* 79*   Cardiac Enzymes:  Recent Labs Lab 04/06/14 0340  TROPONINI <0.03   BNP (last 3 results)  Recent Labs  04/06/14 0340  BNP 264.0*    ProBNP (last 3 results) No results for input(s): PROBNP in the last 8760 hours.  CBG:  Recent Labs Lab 04/06/14 0732 04/06/14 1114 04/06/14 1629 04/06/14 2107  GLUCAP 171* 190* 231* 191*    Recent Results (from the past 240 hour(s))  Culture, blood (routine x 2)     Status: None (Preliminary result)   Collection Time: 04/06/14  5:36 AM  Result Value Ref Range Status   Specimen Description BLOOD RIGHT HAND  Final   Special Requests BOTTLES DRAWN AEROBIC AND ANAEROBIC 12CC EACH  Final   Culture NO GROWTH 1 DAY  Final   Report Status PENDING  Incomplete  MRSA PCR Screening     Status: None   Collection Time: 04/06/14  6:23 AM  Result Value Ref Range Status   MRSA by PCR NEGATIVE NEGATIVE Final    Comment:        The GeneXpert MRSA Assay (FDA approved for NASAL specimens only), is one  component of a comprehensive MRSA colonization surveillance program. It is not intended to diagnose MRSA infection nor to guide or monitor treatment for MRSA infections.   Culture, blood (routine x 2)     Status: None (Preliminary result)   Collection Time: 04/06/14  7:44 AM  Result Value Ref Range Status   Specimen Description BLOOD RIGHT HAND  Final   Special Requests   Final    BOTTLES DRAWN AEROBIC AND ANAEROBIC AEB=10CC ANA=8CC   Culture NO GROWTH 1 DAY  Final   Report Status PENDING  Incomplete     Studies: Ct Angio Chest Pe W/cm &/or Wo Cm  04/06/2014   CLINICAL DATA:  Shortness of breath for 2 days  EXAM: CT ANGIOGRAPHY CHEST WITH CONTRAST  TECHNIQUE: Multidetector CT imaging of the chest was performed using the standard protocol during bolus administration of intravenous contrast. Multiplanar CT image reconstructions and MIPs were  obtained to evaluate the vascular anatomy.  CONTRAST:  1107mL OMNIPAQUE IOHEXOL 350 MG/ML SOLN  COMPARISON:  None.  FINDINGS: The lungs are well aerated bilaterally with patchy lower lobe infiltrates slightly worse on the left than the right. A few tiny partially calcified nodules are identified.  The thoracic inlet is within normal limits. The thoracic aorta and its branches are unremarkable. The pulmonary artery shows no definitive pulmonary emboli. No significant hilar or mediastinal adenopathy is noted.  The visualized upper abdomen shows dependent gallstones. No acute bony abnormality is noted.  Review of the MIP images confirms the above findings.  IMPRESSION: Patchy bilateral lower lobe infiltrates left greater than right.  No definitive pulmonary emboli seen.  Scattered small partially calcified nodules likely related to prior granulomatous disease.   Electronically Signed   By: Inez Catalina M.D.   On: 04/06/2014 13:20   Dg Chest Port 1 View  04/06/2014   CLINICAL DATA:  Dyspnea  EXAM: PORTABLE CHEST - 1 VIEW  COMPARISON:  09/08/2013, 08/07/2011  FINDINGS: There are mildly prominent hilar contours on the left, unchanged. The lungs are clear. There are no large effusions. Heart size is unchanged.  IMPRESSION: No active disease.   Electronically Signed   By: Andreas Newport M.D.   On: 04/06/2014 04:47    Scheduled Meds: . amLODipine  10 mg Oral Daily  . aspirin EC  81 mg Oral Daily  . carvedilol  25 mg Oral BID  . ceFEPime (MAXIPIME) IV  2 g Intravenous Q M,W,F-HD  . doxazosin  2 mg Oral Q12H  . heparin  5,000 Units Subcutaneous 3 times per day  . ipratropium-albuterol  3 mL Nebulization Q6H  . lanthanum  1,000 mg Oral TID WC  . pravastatin  40 mg Oral QPM  . sodium chloride  3 mL Intravenous Q12H  . vancomycin  1,000 mg Intravenous Q M,W,F-HD   Continuous Infusions:   Principal Problem:   Pulmonary edema Active Problems:   HTN (hypertension)   History of kidney transplant    End-stage renal disease on hemodialysis   Dyspnea   Acute respiratory failure with hypoxemia   HCAP (healthcare-associated pneumonia)    Time spent: 25 minutes. Greater than 50% of this time was spent in direct contact with the patient coordinating care.    Lelon Frohlich  [facility] Pager [phone]  If 7PM-7AM, please contact night-coverage at [email or web address], password [facility] 04/07/2014, 1:36 PM  LOS: 1 day

## 2014-04-07 NOTE — Progress Notes (Addendum)
[place] for Vancomycin and Cefepime  Indication: HCAP  Allergies  Allergen Reactions  . Codeine Nausea And Vomiting and Nausea Only    Patient Measurements: Height: 5\' 7"  (170.2 cm) Weight: 232 lb 12.9 oz (105.6 kg) IBW/kg (Calculated) : 66.1  Vital Signs: Temp: 98.2 F (36.8 C) (03/16 0814) Temp Source: Oral (03/16 0814) BP: 161/84 mmHg (03/16 0400) Pulse Rate: 80 (03/16 0500) Intake/Output from previous day: 03/15 0701 - 03/16 0700 In: 120 [P.O.:120] Out: -  Intake/Output from this shift:    Labs:  Recent Labs  04/06/14 0340 04/07/14 0508  WBC 5.9 6.4  HGB 11.3* 10.2*  PLT 76* 79*  CREATININE 10.06* 13.58*   Estimated Creatinine Clearance: 7.4 mL/min (by C-G formula based on Cr of 13.58). No results for input(s): VANCOTROUGH, VANCOPEAK, VANCORANDOM, GENTTROUGH, GENTPEAK, GENTRANDOM, TOBRATROUGH, TOBRAPEAK, TOBRARND, AMIKACINPEAK, AMIKACINTROU, AMIKACIN in the last 72 hours.   Microbiology: Recent Results (from the past 720 hour(s))  Culture, blood (routine x 2)     Status: None (Preliminary result)   Collection Time: 04/06/14  5:36 AM  Result Value Ref Range Status   Specimen Description BLOOD RIGHT HAND  Final   Special Requests BOTTLES DRAWN AEROBIC AND ANAEROBIC 12CC EACH  Final   Culture NO GROWTH <24 HRS  Final   Report Status PENDING  Incomplete  MRSA PCR Screening     Status: None   Collection Time: 04/06/14  6:23 AM  Result Value Ref Range Status   MRSA by PCR NEGATIVE NEGATIVE Final    Comment:        The GeneXpert MRSA Assay (FDA approved for NASAL specimens only), is one component of a comprehensive MRSA colonization surveillance program. It is not intended to diagnose MRSA infection nor to guide or monitor treatment for MRSA infections.   Culture, blood (routine x 2)     Status: None (Preliminary result)   Collection Time: 04/06/14  7:44 AM  Result Value Ref Range Status   Specimen Description BLOOD  RIGHT HAND  Final   Special Requests   Final    BOTTLES DRAWN AEROBIC AND ANAEROBIC AEB=10CC ANA=8CC   Culture NO GROWTH <24 HRS  Final   Report Status PENDING  Incomplete    Anti-infectives    Start     Dose/Rate Route Frequency Ordered Stop   04/07/14 1200  ceFEPIme (MAXIPIME) 2 g in dextrose 5 % 50 mL IVPB     2 g 100 mL/hr over 30 Minutes Intravenous Every M-W-F (Hemodialysis) 04/07/14 0828     04/07/14 1200  vancomycin (VANCOCIN) IVPB 1000 mg/200 mL premix     1,000 mg 200 mL/hr over 60 Minutes Intravenous Every M-W-F (Hemodialysis) 04/07/14 0828     04/06/14 2000  vancomycin (VANCOCIN) 1,500 mg in sodium chloride 0.9 % 500 mL IVPB     1,500 mg 250 mL/hr over 120 Minutes Intravenous  Once 04/06/14 1823 04/06/14 2316   04/06/14 1930  ceFEPIme (MAXIPIME) 2 g in dextrose 5 % 50 mL IVPB     2 g 100 mL/hr over 30 Minutes Intravenous  Once 04/06/14 1823 04/06/14 2142   04/06/14 1800  cefTRIAXone (ROCEPHIN) 1 g in dextrose 5 % 50 mL IVPB - Premix  Status:  Discontinued     1 g 100 mL/hr over 30 Minutes Intravenous Every 24 hours 04/06/14 1759 04/06/14 1804   04/06/14 1800  azithromycin (ZITHROMAX) 500 mg in dextrose 5 % 250 mL IVPB  Status:  Discontinued  500 mg 250 mL/hr over 60 Minutes Intravenous Every 24 hours 04/06/14 1759 04/06/14 1804      Assessment: 66 yoM with ESRD (MWF HD) with infiltrates on Chest CT to be treated for HCAP with Vancomycin & Cefepime.   He is afebrile with normal WBC.  Cx data pending.   Vancomycin 3/15>> Cefepime 3/15>>  Goal of Therapy:  Eradicate infection.  Pre-Hemodialysis Vancomycin level goal range =15-25 mcg/ml  Plan:  Cefepime 2gm qHD Vancomycin 1gm qHD Monitor pre-HD level at steady state F/U cx data and patient progress Duration of therapy per MD  Biagio Borg 04/07/2014,8:33 AM

## 2014-04-07 NOTE — Progress Notes (Signed)
Called report to Russ Halo, RN on dept 300.  Verbalized understanding. Pt transferred to room 306 in safe and stable condition. Schonewitz, Eulis Canner 04/07/2014

## 2014-04-07 NOTE — Progress Notes (Signed)
Pt has consistently scratched off his telemetry while sleeping throughout the night. Pt has been NSR on telemetry.

## 2014-04-08 ENCOUNTER — Other Ambulatory Visit: Payer: Self-pay

## 2014-04-08 LAB — CBC
HCT: 30.3 % — ABNORMAL LOW (ref 39.0–52.0)
HEMOGLOBIN: 10.1 g/dL — AB (ref 13.0–17.0)
MCH: 33.2 pg (ref 26.0–34.0)
MCHC: 33.3 g/dL (ref 30.0–36.0)
MCV: 99.7 fL (ref 78.0–100.0)
Platelets: 79 10*3/uL — ABNORMAL LOW (ref 150–400)
RBC: 3.04 MIL/uL — ABNORMAL LOW (ref 4.22–5.81)
RDW: 12.8 % (ref 11.5–15.5)
WBC: 3.8 10*3/uL — AB (ref 4.0–10.5)

## 2014-04-08 LAB — BASIC METABOLIC PANEL
Anion gap: 12 (ref 5–15)
BUN: 50 mg/dL — AB (ref 6–23)
CHLORIDE: 99 mmol/L (ref 96–112)
CO2: 27 mmol/L (ref 19–32)
Calcium: 8.4 mg/dL (ref 8.4–10.5)
Creatinine, Ser: 11.15 mg/dL — ABNORMAL HIGH (ref 0.50–1.35)
GFR calc non Af Amer: 5 mL/min — ABNORMAL LOW (ref >90)
GFR, EST AFRICAN AMERICAN: 5 mL/min — AB (ref >90)
Glucose, Bld: 128 mg/dL — ABNORMAL HIGH (ref 70–99)
Potassium: 4.1 mmol/L (ref 3.5–5.1)
Sodium: 138 mmol/L (ref 135–145)

## 2014-04-08 LAB — GLUCOSE, CAPILLARY
Glucose-Capillary: 100 mg/dL — ABNORMAL HIGH (ref 70–99)
Glucose-Capillary: 140 mg/dL — ABNORMAL HIGH (ref 70–99)

## 2014-04-08 MED ORDER — INSULIN ASPART 100 UNIT/ML ~~LOC~~ SOLN
3.0000 [IU] | Freq: Three times a day (TID) | SUBCUTANEOUS | Status: DC
  Administered 2014-04-08 – 2014-04-16 (×20): 3 [IU] via SUBCUTANEOUS

## 2014-04-08 MED ORDER — INSULIN ASPART 100 UNIT/ML ~~LOC~~ SOLN
0.0000 [IU] | Freq: Every day | SUBCUTANEOUS | Status: DC
  Administered 2014-04-13: 2 [IU] via SUBCUTANEOUS
  Administered 2014-04-15: 3 [IU] via SUBCUTANEOUS

## 2014-04-08 MED ORDER — INSULIN ASPART 100 UNIT/ML ~~LOC~~ SOLN
0.0000 [IU] | Freq: Three times a day (TID) | SUBCUTANEOUS | Status: DC
  Administered 2014-04-09 – 2014-04-10 (×5): 1 [IU] via SUBCUTANEOUS
  Administered 2014-04-11: 2 [IU] via SUBCUTANEOUS
  Administered 2014-04-12 – 2014-04-13 (×2): 1 [IU] via SUBCUTANEOUS
  Administered 2014-04-13: 2 [IU] via SUBCUTANEOUS
  Administered 2014-04-13: 1 [IU] via SUBCUTANEOUS
  Administered 2014-04-14 (×3): 2 [IU] via SUBCUTANEOUS
  Administered 2014-04-15 – 2014-04-16 (×4): 3 [IU] via SUBCUTANEOUS
  Administered 2014-04-16: 2 [IU] via SUBCUTANEOUS

## 2014-04-08 NOTE — Progress Notes (Addendum)
Subjective: Interval History: has no complaint of some cough. Patient denies any difficulty breathing. He denies also any nausea or vomiting. He has some sputum production but which is pain..  Objective: Vital signs in last 24 hours: Temp:  [97.5 F (36.4 C)-102.4 F (39.1 C)] 97.5 F (36.4 C) (03/17 0551) Pulse Rate:  [79-95] 80 (03/17 0551) Resp:  [16-24] 20 (03/17 0551) BP: (134-167)/(58-91) 134/65 mmHg (03/17 0551) SpO2:  [90 %-98 %] 98 % (03/17 0658) Weight:  [104.781 kg (231 lb)] 104.781 kg (231 lb) (03/17 0551) Weight change: 0 kg (0 lb)  Intake/Output from previous day:   Intake/Output this shift:    General appearance: alert, cooperative and no distress Resp: clear to auscultation bilaterally Cardio: regular rate and rhythm, S1, S2 normal, no murmur, click, rub or gallop GI: soft, non-tender; bowel sounds normal; no masses,  no organomegaly Extremities: extremities normal, atraumatic, no cyanosis or edema  Lab Results:  Recent Labs  04/07/14 0508 04/08/14 0539  WBC 6.4 3.8*  HGB 10.2* 10.1*  HCT 30.1* 30.3*  PLT 79* 79*   BMET:  Recent Labs  04/07/14 0508 04/08/14 0539  NA 136 138  K 5.0 4.1  CL 97 99  CO2 24 27  GLUCOSE 133* 128*  BUN 68* 50*  CREATININE 13.58* 11.15*  CALCIUM 8.6 8.4   No results for input(s): PTH in the last 72 hours. Iron Studies: No results for input(s): IRON, TIBC, TRANSFERRIN, FERRITIN in the last 72 hours.  Studies/Results: Ct Angio Chest Pe W/cm &/or Wo Cm  04/06/2014   CLINICAL DATA:  Shortness of breath for 2 days  EXAM: CT ANGIOGRAPHY CHEST WITH CONTRAST  TECHNIQUE: Multidetector CT imaging of the chest was performed using the standard protocol during bolus administration of intravenous contrast. Multiplanar CT image reconstructions and MIPs were obtained to evaluate the vascular anatomy.  CONTRAST:  182mL OMNIPAQUE IOHEXOL 350 MG/ML SOLN  COMPARISON:  None.  FINDINGS: The lungs are well aerated bilaterally with patchy  lower lobe infiltrates slightly worse on the left than the right. A few tiny partially calcified nodules are identified.  The thoracic inlet is within normal limits. The thoracic aorta and its branches are unremarkable. The pulmonary artery shows no definitive pulmonary emboli. No significant hilar or mediastinal adenopathy is noted.  The visualized upper abdomen shows dependent gallstones. No acute bony abnormality is noted.  Review of the MIP images confirms the above findings.  IMPRESSION: Patchy bilateral lower lobe infiltrates left greater than right.  No definitive pulmonary emboli seen.  Scattered small partially calcified nodules likely related to prior granulomatous disease.   Electronically Signed   By: Inez Catalina M.D.   On: 04/06/2014 13:20    I have reviewed the patient's current medications.  Assessment/Plan: Problem #1 difficulty breathing: Possibly from CHF/sleep apnea/pneumonia.. Patient was dialyzed yesterday and presently seems to be feeling much better. Patient still has some cough but improving. Problem #2 end-stage renal disease: Status post hemodialysis yesterday. Patient does not have any nausea or vomiting. Problem #3 anemia: His hemoglobin and hematocrit is range Problem #4 metabolic bone disease: His calcium is range. Problem #5 history of hypertension: His blood pressure is reasonably controlled Problem #6 history of sleep apnea: Patient on CPAP at home Problem #7 history of diabetes Plan: We will make again arrangement for patient to get dialysis tomorrow. Will remove about 3 L if his blood pressure tolerates. We'll continue his other treatment as before. We'll check his basic metabolic panel, phosphorus and CBC  in the morning.   LOS: 2 days   Kirk [name] S 04/08/2014,8:22 AM

## 2014-04-08 NOTE — Progress Notes (Signed)
Medication effective. Patient able to eat supper.

## 2014-04-08 NOTE — Progress Notes (Signed)
[facility] PROGRESS NOTE  Kirk [name] [id number] DOB: 05-Jun-1960 DOA: 04/06/2014 PCP: Estill Dooms, MD  Assessment/Plan: Acute respiratory Failure -Improved. -Saturating well on [place]. -Believe 2/2 HCAP as well as mild fluid overload given his ESRD state. -CT negative for PE.  HCAP -Vanc/Cefepime day 3. -All cx data remains negative to date. -Spiked temp overnight. Observe and await culture data.  ESRD -HD MWF. -Appreciate renal input and recommendations.  HTN -Well controlled.  DM -Check A1C. -SSI.   Code Status: Full Code Family Communication: Wife at bedside updated on plan of care  Disposition Plan: Home when medically stable.   Consultants:  Renal   Antibiotics:  Vancomycin  Cefepime   Subjective: Feels much better today, less back pain and less SOB. Has some minor nose bleeds.  Objective: Filed Vitals:   04/08/14 0658 04/08/14 1404 04/08/14 1411 04/08/14 1412  BP:  145/65    Pulse:  83    Temp:  98.2 F (36.8 C)    TempSrc:  Oral    Resp:  20    Height:      Weight:      SpO2: 98% 93% 89% 93%   No intake or output data in the 24 hours ending 04/08/14 1455 Filed Weights   04/07/14 0500 04/07/14 0814 04/08/14 0551  Weight: 105.6 kg (232 lb 12.9 oz) 105.6 kg (232 lb 12.9 oz) 104.781 kg (231 lb)    Exam:   General:  AA Ox3.  Cardiovascular: RRR  Respiratory: CTA B  Abdomen: S/NT/ND/+BS  Extremities: trace bilateral edema   Neurologic:  Non-focal  Data Reviewed: Basic Metabolic Panel:  Recent Labs Lab 04/06/14 0340 04/07/14 0508 04/08/14 0539  NA 140 136 138  K 4.1 5.0 4.1  CL 98 97 99  CO2 28 24 27   GLUCOSE 134* 133* 128*  BUN 36* 68* 50*  CREATININE 10.06* 13.58* 11.15*  CALCIUM 8.7 8.6 8.4   Liver Function Tests:  Recent Labs Lab 04/06/14 0340 04/07/14 0508  AST 25 23  ALT 27 24  ALKPHOS 53 52  BILITOT 1.0 0.8  PROT 8.1 7.0  ALBUMIN 4.1 3.6   No results for input(s): LIPASE, AMYLASE  in the last 168 hours. No results for input(s): AMMONIA in the last 168 hours. CBC:  Recent Labs Lab 04/06/14 0340 04/07/14 0508 04/08/14 0539  WBC 5.9 6.4 3.8*  NEUTROABS 5.0  --   --   HGB 11.3* 10.2* 10.1*  HCT 33.6* 30.1* 30.3*  MCV 98.8 98.0 99.7  PLT 76* 79* 79*   Cardiac Enzymes:  Recent Labs Lab 04/06/14 0340  TROPONINI <0.03   BNP (last 3 results)  Recent Labs  04/06/14 0340  BNP 264.0*    ProBNP (last 3 results) No results for input(s): PROBNP in the last 8760 hours.  CBG:  Recent Labs Lab 04/06/14 0732 04/06/14 1114 04/06/14 1629 04/06/14 2107  GLUCAP 171* 190* 231* 191*    Recent Results (from the past 240 hour(s))  Culture, blood (routine x 2)     Status: None (Preliminary result)   Collection Time: 04/06/14  5:36 AM  Result Value Ref Range Status   Specimen Description BLOOD RIGHT HAND  Final   Special Requests BOTTLES DRAWN AEROBIC AND ANAEROBIC 12CC EACH  Final   Culture NO GROWTH 1 DAY  Final   Report Status PENDING  Incomplete  MRSA PCR Screening     Status: None   Collection Time: 04/06/14  6:23 AM  Result Value Ref  Range Status   MRSA by PCR NEGATIVE NEGATIVE Final    Comment:        The GeneXpert MRSA Assay (FDA approved for NASAL specimens only), is one component of a comprehensive MRSA colonization surveillance program. It is not intended to diagnose MRSA infection nor to guide or monitor treatment for MRSA infections.   Culture, blood (routine x 2)     Status: None (Preliminary result)   Collection Time: 04/06/14  7:44 AM  Result Value Ref Range Status   Specimen Description BLOOD RIGHT HAND  Final   Special Requests   Final    BOTTLES DRAWN AEROBIC AND ANAEROBIC AEB=10CC ANA=8CC   Culture NO GROWTH 1 DAY  Final   Report Status PENDING  Incomplete  Culture, Urine     Status: None   Collection Time: 04/06/14 10:30 AM  Result Value Ref Range Status   Specimen Description URINE, CLEAN CATCH  Final   Special Requests  NONE  Final   Colony Count   Final    50,000 COLONIES/ML Performed at [facility]    Culture   Final    Multiple bacterial morphotypes present, none predominant. Suggest appropriate recollection if clinically indicated. Performed at [facility]    Report Status 04/07/2014 FINAL  Final     Studies: No results found.  Scheduled Meds: . amLODipine  10 mg Oral Daily  . aspirin EC  81 mg Oral Daily  . carvedilol  25 mg Oral BID  . ceFEPime (MAXIPIME) IV  2 g Intravenous Q M,W,F-HD  . doxazosin  2 mg Oral Q12H  . heparin  5,000 Units Subcutaneous 3 times per day  . ipratropium-albuterol  3 mL Nebulization Q6H  . lanthanum  1,000 mg Oral TID WC  . pravastatin  40 mg Oral QPM  . sodium chloride  3 mL Intravenous Q12H  . vancomycin  1,000 mg Intravenous Q M,W,F-HD   Continuous Infusions:   Principal Problem:   Acute respiratory failure with hypoxemia Active Problems:   HTN (hypertension)   History of kidney transplant   End-stage renal disease on hemodialysis   Pulmonary edema   Dyspnea   HCAP (healthcare-associated pneumonia)    Time spent: 35 minutes. Greater than 50% of this time was spent in direct contact with the patient coordinating care.    Lelon Frohlich  [facility] Pager [phone]  If 7PM-7AM, please contact night-coverage at [email or web address], password [facility] 04/08/2014, 2:55 PM  LOS: 2 days

## 2014-04-08 NOTE — Progress Notes (Signed)
Patient c/o chest pain radiating down right arm. Notified Dr. Jerilee Hoh of patient complaint. Medicated earlier for nausea. EKG obtain. O2 in use via nasal cannula. Medicated for c/o chest pain.

## 2014-04-08 NOTE — Progress Notes (Signed)
Patient disoriented this AM.  Reorientation not successful.  Family will continue to reorient.  Will pass on to the next nurse.  Will continue to monitor.

## 2014-04-09 LAB — CBC
HCT: 26.6 % — ABNORMAL LOW (ref 39.0–52.0)
Hemoglobin: 8.9 g/dL — ABNORMAL LOW (ref 13.0–17.0)
MCH: 33 pg (ref 26.0–34.0)
MCHC: 33.5 g/dL (ref 30.0–36.0)
MCV: 98.5 fL (ref 78.0–100.0)
Platelets: 75 10*3/uL — ABNORMAL LOW (ref 150–400)
RBC: 2.7 MIL/uL — AB (ref 4.22–5.81)
RDW: 12.7 % (ref 11.5–15.5)
WBC: 2.8 10*3/uL — AB (ref 4.0–10.5)

## 2014-04-09 LAB — PHOSPHORUS: PHOSPHORUS: 7.4 mg/dL — AB (ref 2.3–4.6)

## 2014-04-09 LAB — HEMOGLOBIN A1C
Hgb A1c MFr Bld: 6 % — ABNORMAL HIGH (ref 4.8–5.6)
Mean Plasma Glucose: 126 mg/dL

## 2014-04-09 LAB — BASIC METABOLIC PANEL
ANION GAP: 14 (ref 5–15)
BUN: 69 mg/dL — ABNORMAL HIGH (ref 6–23)
CALCIUM: 8 mg/dL — AB (ref 8.4–10.5)
CO2: 24 mmol/L (ref 19–32)
Chloride: 99 mmol/L (ref 96–112)
Creatinine, Ser: 13.77 mg/dL — ABNORMAL HIGH (ref 0.50–1.35)
GFR calc Af Amer: 4 mL/min — ABNORMAL LOW (ref >90)
GFR, EST NON AFRICAN AMERICAN: 4 mL/min — AB (ref >90)
Glucose, Bld: 106 mg/dL — ABNORMAL HIGH (ref 70–99)
POTASSIUM: 5.2 mmol/L — AB (ref 3.5–5.1)
SODIUM: 137 mmol/L (ref 135–145)

## 2014-04-09 LAB — GLUCOSE, CAPILLARY
GLUCOSE-CAPILLARY: 130 mg/dL — AB (ref 70–99)
Glucose-Capillary: 114 mg/dL — ABNORMAL HIGH (ref 70–99)
Glucose-Capillary: 145 mg/dL — ABNORMAL HIGH (ref 70–99)

## 2014-04-09 MED ORDER — BENZONATATE 100 MG PO CAPS
100.0000 mg | ORAL_CAPSULE | Freq: Two times a day (BID) | ORAL | Status: DC | PRN
  Administered 2014-04-09: 100 mg via ORAL
  Filled 2014-04-09: qty 1

## 2014-04-09 MED ORDER — MORPHINE SULFATE 2 MG/ML IJ SOLN
1.0000 mg | Freq: Once | INTRAMUSCULAR | Status: AC
Start: 2014-04-09 — End: 2014-04-09
  Administered 2014-04-09: 1 mg via INTRAVENOUS
  Filled 2014-04-09: qty 1

## 2014-04-09 MED ORDER — ALTEPLASE 2 MG IJ SOLR
2.0000 mg | Freq: Once | INTRAMUSCULAR | Status: AC | PRN
  Filled 2014-04-09: qty 2

## 2014-04-09 MED ORDER — GUAIFENESIN ER 600 MG PO TB12
600.0000 mg | ORAL_TABLET | Freq: Two times a day (BID) | ORAL | Status: DC | PRN
  Administered 2014-04-09 (×2): 600 mg via ORAL
  Filled 2014-04-09 (×2): qty 1

## 2014-04-09 MED ORDER — LANTHANUM CARBONATE 500 MG PO CHEW
1500.0000 mg | CHEWABLE_TABLET | Freq: Three times a day (TID) | ORAL | Status: DC
  Administered 2014-04-09 – 2014-04-16 (×18): 1500 mg via ORAL
  Filled 2014-04-09 (×31): qty 3

## 2014-04-09 MED ORDER — EPOETIN ALFA 10000 UNIT/ML IJ SOLN
10000.0000 [IU] | INTRAMUSCULAR | Status: DC
  Administered 2014-04-09 – 2014-04-16 (×4): 10000 [IU] via SUBCUTANEOUS
  Filled 2014-04-09 (×5): qty 1

## 2014-04-09 MED ORDER — EPOETIN ALFA 10000 UNIT/ML IJ SOLN
INTRAMUSCULAR | Status: AC
Start: 2014-04-09 — End: 2014-04-09
  Administered 2014-04-09: 10000 [IU] via SUBCUTANEOUS
  Filled 2014-04-09: qty 1

## 2014-04-09 MED ORDER — SODIUM CHLORIDE 0.9 % IV SOLN: 100.0000 mL | INTRAVENOUS | Status: DC | PRN

## 2014-04-09 MED ORDER — HEPARIN SODIUM (PORCINE) 1000 UNIT/ML IJ SOLN
INTRAMUSCULAR | Status: AC
  Administered 2014-04-09: 2100 [IU] via INTRAVENOUS_CENTRAL
  Filled 2014-04-09: qty 3

## 2014-04-09 NOTE — Progress Notes (Signed)
[place] for Vancomycin and Cefepime  Indication: HCAP  Allergies  Allergen Reactions  . Codeine Nausea And Vomiting and Nausea Only    Patient Measurements: Height: 5\' 7"  (170.2 cm) Weight: 237 lb (107.502 kg) IBW/kg (Calculated) : 66.1  Vital Signs: Temp: 97.8 F (36.6 C) (03/18 0641) Temp Source: Axillary (03/18 0641) BP: 126/51 mmHg (03/18 0641) Pulse Rate: 73 (03/18 0641) Intake/Output from previous day: 03/17 0701 - 03/18 0700 In: 240 [P.O.:240] Out: -  Intake/Output from this shift:    Labs:  Recent Labs  04/07/14 0508 04/08/14 0539 04/09/14 0705  WBC 6.4 3.8* 2.8*  HGB 10.2* 10.1* 8.9*  PLT 79* 79* 75*  CREATININE 13.58* 11.15* 13.77*   Estimated Creatinine Clearance: 7.3 mL/min (by C-G formula based on Cr of 13.77). No results for input(s): VANCOTROUGH, VANCOPEAK, VANCORANDOM, GENTTROUGH, GENTPEAK, GENTRANDOM, TOBRATROUGH, TOBRAPEAK, TOBRARND, AMIKACINPEAK, AMIKACINTROU, AMIKACIN in the last 72 hours.   Microbiology: Recent Results (from the past 720 hour(s))  Culture, blood (routine x 2)     Status: None (Preliminary result)   Collection Time: 04/06/14  5:36 AM  Result Value Ref Range Status   Specimen Description BLOOD RIGHT HAND  Final   Special Requests BOTTLES DRAWN AEROBIC AND ANAEROBIC 12CC EACH  Final   Culture NO GROWTH 3 DAYS  Final   Report Status PENDING  Incomplete  MRSA PCR Screening     Status: None   Collection Time: 04/06/14  6:23 AM  Result Value Ref Range Status   MRSA by PCR NEGATIVE NEGATIVE Final    Comment:        The GeneXpert MRSA Assay (FDA approved for NASAL specimens only), is one component of a comprehensive MRSA colonization surveillance program. It is not intended to diagnose MRSA infection nor to guide or monitor treatment for MRSA infections.   Culture, blood (routine x 2)     Status: None (Preliminary result)   Collection Time: 04/06/14  7:44 AM  Result Value Ref Range  Status   Specimen Description BLOOD RIGHT HAND  Final   Special Requests   Final    BOTTLES DRAWN AEROBIC AND ANAEROBIC AEB=10CC ANA=8CC   Culture NO GROWTH 3 DAYS  Final   Report Status PENDING  Incomplete  Culture, Urine     Status: None   Collection Time: 04/06/14 10:30 AM  Result Value Ref Range Status   Specimen Description URINE, CLEAN CATCH  Final   Special Requests NONE  Final   Colony Count   Final    50,000 COLONIES/ML Performed at [facility]    Culture   Final    Multiple bacterial morphotypes present, none predominant. Suggest appropriate recollection if clinically indicated. Performed at [facility]    Report Status 04/07/2014 FINAL  Final    Anti-infectives    Start     Dose/Rate Route Frequency Ordered Stop   04/07/14 1200  ceFEPIme (MAXIPIME) 2 g in dextrose 5 % 50 mL IVPB     2 g 100 mL/hr over 30 Minutes Intravenous Every M-W-F (Hemodialysis) 04/07/14 0828     04/07/14 1200  vancomycin (VANCOCIN) IVPB 1000 mg/200 mL premix     1,000 mg 200 mL/hr over 60 Minutes Intravenous Every M-W-F (Hemodialysis) 04/07/14 0828     04/06/14 2000  vancomycin (VANCOCIN) 1,500 mg in sodium chloride 0.9 % 500 mL IVPB     1,500 mg 250 mL/hr over 120 Minutes Intravenous  Once 04/06/14 1823 04/06/14 2316   04/06/14  1930  ceFEPIme (MAXIPIME) 2 g in dextrose 5 % 50 mL IVPB     2 g 100 mL/hr over 30 Minutes Intravenous  Once 04/06/14 1823 04/06/14 2142   04/06/14 1800  cefTRIAXone (ROCEPHIN) 1 g in dextrose 5 % 50 mL IVPB - Premix  Status:  Discontinued     1 g 100 mL/hr over 30 Minutes Intravenous Every 24 hours 04/06/14 1759 04/06/14 1804   04/06/14 1800  azithromycin (ZITHROMAX) 500 mg in dextrose 5 % 250 mL IVPB  Status:  Discontinued     500 mg 250 mL/hr over 60 Minutes Intravenous Every 24 hours 04/06/14 1759 04/06/14 1804      Assessment: 84 yoM with ESRD (MWF HD) currently on day#4 Vancomycin & Cefepime for HCAP.   He is afebrile with normal WBC.  Cx  data NGTD. CXR + PNA.   Vancomycin 3/15>> Cefepime 3/15>>  Goal of Therapy:  Eradicate infection.  Pre-Hemodialysis Vancomycin level goal range =15-25 mcg/ml  Plan:  Cefepime 2gm qHD Vancomycin 1gm qHD Monitor pre-HD level at steady state -on Mon if Vanc continues F/U cx data and patient progress Duration of therapy per MD- consider change to PO abx once clinically appropriate  Helvi Royals, Lavonia Drafts 04/09/2014,10:46 AM

## 2014-04-09 NOTE — Procedures (Signed)
   HEMODIALYSIS TREATMENT NOTE:  4 hour low heparin dialysis completed via left forearm buttonhole AVF (15g/ante/retrograde).  Goal met:  3.2 liters removed.  No interruption in ultrafiltration. All blood reinfused. Hemostasis achieved in 5 minutes.  Report called to Santiago Glad, Therapist, sports.  Rockwell Alexandria, RN, CDN

## 2014-04-09 NOTE — Progress Notes (Signed)
Patient given extra albuterol with reg neb due to constant cough , cough cleared with treatment

## 2014-04-09 NOTE — Progress Notes (Signed)
Subjective: Interval History: Patient feels much better. Still has cough with some sputum production. He denies chest pain or difficulty breathing.  Objective: Vital signs in last 24 hours: Temp:  [97.8 F (36.6 C)-98.8 F (37.1 C)] 97.8 F (36.6 C) (03/18 0641) Pulse Rate:  [73-95] 73 (03/18 0641) Resp:  [17-20] 20 (03/18 0641) BP: (126-151)/(51-71) 126/51 mmHg (03/18 0641) SpO2:  [89 %-100 %] 90 % (03/18 0808) Weight:  [107.502 kg (237 lb)] 107.502 kg (237 lb) (03/18 0641) Weight change: 1.903 kg (4 lb 3.1 oz)  Intake/Output from previous day: 03/17 0701 - 03/18 0700 In: 240 [P.O.:240] Out: -  Intake/Output this shift:    General appearance: alert, cooperative and no distress Resp: clear to auscultation bilaterally Cardio: regular rate and rhythm, S1, S2 normal, no murmur, click, rub or gallop GI: soft, non-tender; bowel sounds normal; no masses,  no organomegaly Extremities: extremities normal, atraumatic, no cyanosis or edema  Lab Results:  Recent Labs  04/08/14 0539 04/09/14 0705  WBC 3.8* 2.8*  HGB 10.1* 8.9*  HCT 30.3* 26.6*  PLT 79* 75*   BMET:   Recent Labs  04/08/14 0539 04/09/14 0705  NA 138 137  K 4.1 5.2*  CL 99 99  CO2 27 24  GLUCOSE 128* 106*  BUN 50* 69*  CREATININE 11.15* 13.77*  CALCIUM 8.4 8.0*   No results for input(s): PTH in the last 72 hours. Iron Studies: No results for input(s): IRON, TIBC, TRANSFERRIN, FERRITIN in the last 72 hours.  Studies/Results: No results found.  I have reviewed the patient's current medications.  Assessment/Plan: Problem #1 difficulty breathing: Possibly from CHF/sleep apnea/pneumonia.. Patient is feeling much better. He is status post hemodialysis on Wednesday. Problem #2 end-stage renal disease: Status post hemodialysis the day before yesterday. Patient does not have any nausea or vomiting. Problem #3 anemia: His hemoglobin and hematocrit is declining and presently is below range. Problem #4  metabolic bone disease: His calcium is range but his phosphorus is high. Presently patient is on Fosrenol 1000 mg by mouth 3 times a day with meals and 500 with snack. Problem #5 history of hypertension: His blood pressure is reasonably controlled Problem #6 history of sleep apnea: Patient on CPAP at home Problem #7 history of diabetes Plan: We will make again arrangement for patient to get dialysis today Will remove about 3 L if his blood pressure tolerates. We'll increase Fosrenol to 1500 mg by mouth 3 times a day and continues 500 with snack. We'll start patient on Epogen 10,000 units IV after each dialysis. We'll  and CBC in the morning.   LOS: 3 days   Makale Pindell S 04/09/2014,9:31 AM

## 2014-04-09 NOTE — Progress Notes (Signed)
  PROGRESS NOTE  Kirk [name] [id number] DOB: Jul 08, 1960 DOA: 04/06/2014 PCP: Estill Dooms, MD  Summary: 54 year old man presented with shortness of breath, admitted for acute hypoxic respiratory failure, subsequently diagnosed with healthcare associated pneumonia.  Assessment/Plan: 1. Acute hypoxic respiratory failure. Resolved. 2. Healthcare associated pneumonia clinically improving rapidly. Hypoxia has resolved. No leukocytosis. 3. Diabetes mellitus type 2 with diabetic nephropathy. Stable. 4. Hypertension. Stable. 5. Anemia of chronic disease. Stable. 6. Chronic thrombocytopenia stable. Follow-up as an outpatient. 7. End-stage renal disease. Per nephrology. 8. Obstructive sleep apnea, CPAP.   Overall improving rapidly.  Discontinue heparin. SCDs.  Anticipate discharge home next 24 hours   Code Status: full code DVT prophylaxis: SCDs Family Communication: none Disposition Plan: home  Murray Hodgkins, MD  [facility]  Pager [phone] If 7PM-7AM, please contact night-coverage at [email or web address], password [facility] 04/09/2014, 5:50 PM  LOS: 3 days   Consultants:    Procedures:    Antibiotics: Vancomycin 3/15 >> Cefepime 3/15 >>  HPI/Subjective: Overall feeling better. Breathing better. Still coughing, productive.  Objective: Filed Vitals:   04/09/14 1630 04/09/14 1645 04/09/14 1715 04/09/14 1730  BP: 163/85 165/82 161/85 156/78  Pulse: 69 76 77 72  Temp:      TempSrc:      Resp:      Height:      Weight:      SpO2:       No intake or output data in the 24 hours ending 04/09/14 1750   Filed Weights   04/07/14 0814 04/08/14 0551 04/09/14 0641  Weight: 105.6 kg (232 lb 12.9 oz) 104.781 kg (231 lb) 107.502 kg (237 lb)    Exam:     Afebrile, vital signs stable General:  Appears calm and comfortable Cardiovascular: RRR, no m/r/g. No LE edema. Respiratory: CTA bilaterally, no w/r/r. Normal respiratory effort. Psychiatric: grossly normal mood  and affect, speech fluent and appropriate  Data Reviewed:  Capillary blood sugar stable   Basic metabolic panel noted, nephrology managing   Platelet count stable  Anemia stable  CT chest showed patchy bilateral lower lobe infiltrates left greater than right  Scheduled Meds: . amLODipine  10 mg Oral Daily  . aspirin EC  81 mg Oral Daily  . carvedilol  25 mg Oral BID  . ceFEPime (MAXIPIME) IV  2 g Intravenous Q M,W,F-HD  . doxazosin  2 mg Oral Q12H  . epoetin (EPOGEN/PROCRIT) injection  10,000 Units Subcutaneous Q M,W,F-HD  . heparin  5,000 Units Subcutaneous 3 times per day  . insulin aspart  0-5 Units Subcutaneous QHS  . insulin aspart  0-9 Units Subcutaneous TID WC  . insulin aspart  3 Units Subcutaneous TID WC  . ipratropium-albuterol  3 mL Nebulization Q6H  . lanthanum  1,500 mg Oral TID WC  . pravastatin  40 mg Oral QPM  . sodium chloride  3 mL Intravenous Q12H  . vancomycin  1,000 mg Intravenous Q M,W,F-HD   Continuous Infusions:   Principal Problem:   Acute respiratory failure with hypoxemia Active Problems:   HTN (hypertension)   History of kidney transplant   End-stage renal disease on hemodialysis   Pulmonary edema   Dyspnea   HCAP (healthcare-associated pneumonia)   Time spent 25 minutes

## 2014-04-10 ENCOUNTER — Inpatient Hospital Stay (HOSPITAL_COMMUNITY): Payer: Medicare Other

## 2014-04-10 DIAGNOSIS — E1121 Type 2 diabetes mellitus with diabetic nephropathy: Secondary | ICD-10-CM

## 2014-04-10 DIAGNOSIS — D696 Thrombocytopenia, unspecified: Secondary | ICD-10-CM

## 2014-04-10 LAB — GLUCOSE, CAPILLARY
GLUCOSE-CAPILLARY: 118 mg/dL — AB (ref 70–99)
Glucose-Capillary: 145 mg/dL — ABNORMAL HIGH (ref 70–99)
Glucose-Capillary: 122 mg/dL — ABNORMAL HIGH (ref 70–99)
Glucose-Capillary: 123 mg/dL — ABNORMAL HIGH (ref 70–99)

## 2014-04-10 MED ORDER — PROMETHAZINE HCL 25 MG/ML IJ SOLN
12.5000 mg | Freq: Four times a day (QID) | INTRAMUSCULAR | Status: DC | PRN
  Administered 2014-04-10: 12.5 mg via INTRAVENOUS
  Filled 2014-04-10: qty 1

## 2014-04-10 MED ORDER — GUAIFENESIN ER 600 MG PO TB12
600.0000 mg | ORAL_TABLET | Freq: Two times a day (BID) | ORAL | Status: DC
  Administered 2014-04-10 – 2014-04-11 (×3): 600 mg via ORAL
  Filled 2014-04-10 (×3): qty 1

## 2014-04-10 MED ORDER — POLYETHYLENE GLYCOL 3350 17 G PO PACK
17.0000 g | PACK | Freq: Two times a day (BID) | ORAL | Status: DC
  Administered 2014-04-11 – 2014-04-14 (×7): 17 g via ORAL
  Filled 2014-04-10 (×12): qty 1

## 2014-04-10 NOTE — Progress Notes (Signed)
Subjective: Interval History: Patient continue to complain of productive cough. He denies any fever chills or sweating. His breathing is in general good.  Objective: Vital signs in last 24 hours: Temp:  [98.1 F (36.7 C)-98.7 F (37.1 C)] 98.1 F (36.7 C) (03/19 0548) Pulse Rate:  [65-90] 65 (03/19 0548) Resp:  [16-20] 20 (03/19 0548) BP: (147-190)/(62-92) 147/70 mmHg (03/19 0548) SpO2:  [90 %-100 %] 98 % (03/19 0729) Weight:  [108.591 kg (239 lb 6.4 oz)] 108.591 kg (239 lb 6.4 oz) (03/19 0548) Weight change: 1.089 kg (2 lb 6.4 oz)  Intake/Output from previous day: 03/18 0701 - 03/19 0700 In: -  Out: 3300  Intake/Output this shift:    General appearance: alert, cooperative and no distress Resp: clear to auscultation bilaterally Cardio: regular rate and rhythm, S1, S2 normal, no murmur, click, rub or gallop GI: soft, non-tender; bowel sounds normal; no masses,  no organomegaly Extremities: extremities normal, atraumatic, no cyanosis or edema  Lab Results:  Recent Labs  04/08/14 0539 04/09/14 0705  WBC 3.8* 2.8*  HGB 10.1* 8.9*  HCT 30.3* 26.6*  PLT 79* 75*   BMET:   Recent Labs  04/08/14 0539 04/09/14 0705  NA 138 137  K 4.1 5.2*  CL 99 99  CO2 27 24  GLUCOSE 128* 106*  BUN 50* 69*  CREATININE 11.15* 13.77*  CALCIUM 8.4 8.0*   No results for input(s): PTH in the last 72 hours. Iron Studies: No results for input(s): IRON, TIBC, TRANSFERRIN, FERRITIN in the last 72 hours.  Studies/Results: No results found.  I have reviewed the patient's current medications.  Assessment/Plan: Problem #1 difficulty breathing: Possibly from CHF/sleep apnea/pneumonia.Marland Kitchen His status post hemodialysis yesterday. We are able to ultrafiltrate 3200 mL. Still has some cough with sputum production. Problem #2 end-stage renal disease: Status post hemodialysis the day yesterday. Presently he is asymptomatic. Problem #3 anemia: His hemoglobin is below the target goal and patient is on  Epogen. Problem #4 metabolic bone disease: His calcium is range but his phosphorus is high. Presently patient is on Fosrenol 1500 mg by mouth 3 times a day with meals and 500 with snack. Problem #5 history of hypertension: His blood pressure is reasonably controlled Problem #6 history of sleep apnea: Patient on CPAP at home Problem #7 history of diabetes Plan: We'll continue with present management. His next dialysis will be on Monday and if patient is going to be discharged to go to his regular dialysis unit as an outpatient. We'll basic metabolic panel  and CBC in the morning.   LOS: 4 days   Jonh Mcqueary S 04/10/2014,8:47 AM

## 2014-04-10 NOTE — Progress Notes (Signed)
PROGRESS NOTE  Kirk [name] [id number] DOB: 1960/04/21 DOA: 04/06/2014 PCP: Estill Dooms, MD  Summary: 54 year old man presented with shortness of breath, admitted for acute hypoxic respiratory failure, subsequently diagnosed with healthcare associated pneumonia.  Assessment/Plan: 1. Acute hypoxic respiratory failure. Appears resolved. 2. Healthcare associated pneumonia improving, afebrile >24 hours but still coughing. Hypoxia has resolved. Strep and legionella urinary antigens negative. Influenza PCR negative.  3. Diabetes mellitus type 2 with diabetic nephropathy. Remains stable. 4. Hypertension. Remains stable. 5. Anemia of chronic disease. . 6. Chronic thrombocytopenia. Follow-up as an outpatient. 7. End-stage renal disease. Per nephrology. 8. Obstructive sleep apnea, CPAP.   Continue abx  Add cough suppressant  Adjust anti-emetic  CBC in AM  Not ready for d/c yet--nausea, vomiting, poor appetite  Code Status: full code DVT prophylaxis: SCDs Family Communication: none Disposition Plan: home  Murray Hodgkins, MD  [facility]  Pager [phone] If 7PM-7AM, please contact night-coverage at [email or web address], password [email or web address] 04/10/2014, 11:31 AM  LOS: 4 days   Consultants:    Procedures:    Antibiotics: Vancomycin 3/15 >> 3/21 Cefepime 3/15 >> 3/21  HPI/Subjective: Very poor appetite. Nauseous today, vomited this AM. Not interested in eating. Continues to have non-productive cough. Breathing ok. Bowels moved yesterday.   Objective: Filed Vitals:   04/09/14 2231 04/10/14 0120 04/10/14 0548 04/10/14 0729  BP:   147/70   Pulse: 86  65   Temp:   98.1 F (36.7 C)   TempSrc:   Oral   Resp: 20  20   Height:      Weight:   108.591 kg (239 lb 6.4 oz)   SpO2: 90% 96% 99% 98%    Intake/Output Summary (Last 24 hours) at 04/10/14 1131 Last data filed at 04/10/14 1027  Gross per 24 hour  Intake    120 ml  Output   3300 ml  Net  -3180 ml     Filed  Weights   04/08/14 0551 04/09/14 0641 04/10/14 0548  Weight: 104.781 kg (231 lb) 107.502 kg (237 lb) 108.591 kg (239 lb 6.4 oz)    Exam:     Afebrile,  VSS. General: Appears calm, uncomfortable but non-toxic Cardiovascular: RRR, no m/r/g.  Respiratory: CTA bilaterally, no w/r/r. Normal respiratory effort. Abdomen: soft, ntnd, normal BS Psychiatric: grossly normal mood and affect, speech fluent and appropriate Neurologic: grossly non-focal.  Data Reviewed:  Capillary blood sugars remain stable   Basic metabolic panel noted, nephrology managing   Platelet count stable  Blood cultures no growth today  Urine culture multiple morphotypes non-predominant  Anemia stable  CT chest showed patchy bilateral lower lobe infiltrates left greater than right  Scheduled Meds: . amLODipine  10 mg Oral Daily  . aspirin EC  81 mg Oral Daily  . carvedilol  25 mg Oral BID  . ceFEPime (MAXIPIME) IV  2 g Intravenous Q M,W,F-HD  . doxazosin  2 mg Oral Q12H  . epoetin (EPOGEN/PROCRIT) injection  10,000 Units Subcutaneous Q M,W,F-HD  . insulin aspart  0-5 Units Subcutaneous QHS  . insulin aspart  0-9 Units Subcutaneous TID WC  . insulin aspart  3 Units Subcutaneous TID WC  . ipratropium-albuterol  3 mL Nebulization Q6H  . lanthanum  1,500 mg Oral TID WC  . pravastatin  40 mg Oral QPM  . sodium chloride  3 mL Intravenous Q12H  . vancomycin  1,000 mg Intravenous Q M,W,F-HD   Continuous Infusions:   Principal Problem:   Acute respiratory failure with hypoxia Active  Problems:   OSA on CPAP   HTN (hypertension)   DM (diabetes mellitus), type 2 with renal complications   Anemia in chronic renal disease   History of kidney transplant   End-stage renal disease on hemodialysis   HCAP (healthcare-associated pneumonia)   Thrombocytopenia   Time spent 20 minutes

## 2014-04-11 DIAGNOSIS — D631 Anemia in chronic kidney disease: Secondary | ICD-10-CM

## 2014-04-11 DIAGNOSIS — N189 Chronic kidney disease, unspecified: Secondary | ICD-10-CM

## 2014-04-11 LAB — CBC
HCT: 27.6 % — ABNORMAL LOW (ref 39.0–52.0)
Hemoglobin: 9.3 g/dL — ABNORMAL LOW (ref 13.0–17.0)
MCH: 33.1 pg (ref 26.0–34.0)
MCHC: 33.7 g/dL (ref 30.0–36.0)
MCV: 98.2 fL (ref 78.0–100.0)
Platelets: 87 10*3/uL — ABNORMAL LOW (ref 150–400)
RBC: 2.81 MIL/uL — ABNORMAL LOW (ref 4.22–5.81)
RDW: 12.5 % (ref 11.5–15.5)
WBC: 3.6 10*3/uL — ABNORMAL LOW (ref 4.0–10.5)

## 2014-04-11 LAB — COMPREHENSIVE METABOLIC PANEL
ALBUMIN: 3.3 g/dL — AB (ref 3.5–5.2)
ALT: 24 U/L (ref 0–53)
ANION GAP: 12 (ref 5–15)
AST: 23 U/L (ref 0–37)
Alkaline Phosphatase: 43 U/L (ref 39–117)
BUN: 54 mg/dL — AB (ref 6–23)
CALCIUM: 8.3 mg/dL — AB (ref 8.4–10.5)
CO2: 27 mmol/L (ref 19–32)
CREATININE: 11.86 mg/dL — AB (ref 0.50–1.35)
Chloride: 98 mmol/L (ref 96–112)
GFR calc Af Amer: 5 mL/min — ABNORMAL LOW (ref >90)
GFR, EST NON AFRICAN AMERICAN: 4 mL/min — AB (ref >90)
GLUCOSE: 104 mg/dL — AB (ref 70–99)
POTASSIUM: 4.6 mmol/L (ref 3.5–5.1)
Sodium: 137 mmol/L (ref 135–145)
Total Bilirubin: 1 mg/dL (ref 0.3–1.2)
Total Protein: 6.7 g/dL (ref 6.0–8.3)

## 2014-04-11 LAB — EXPECTORATED SPUTUM ASSESSMENT W GRAM STAIN, RFLX TO RESP C

## 2014-04-11 LAB — CULTURE, BLOOD (ROUTINE X 2)
Culture: NO GROWTH
Culture: NO GROWTH

## 2014-04-11 LAB — GLUCOSE, CAPILLARY
GLUCOSE-CAPILLARY: 112 mg/dL — AB (ref 70–99)
Glucose-Capillary: 104 mg/dL — ABNORMAL HIGH (ref 70–99)
Glucose-Capillary: 160 mg/dL — ABNORMAL HIGH (ref 70–99)
Glucose-Capillary: 125 mg/dL — ABNORMAL HIGH (ref 70–99)

## 2014-04-11 LAB — LIPASE, BLOOD: LIPASE: 52 U/L (ref 11–59)

## 2014-04-11 LAB — EXPECTORATED SPUTUM ASSESSMENT W REFEX TO RESP CULTURE

## 2014-04-11 LAB — HEPATITIS B SURFACE ANTIGEN: Hepatitis B Surface Ag: NEGATIVE

## 2014-04-11 MED ORDER — BENZONATATE 100 MG PO CAPS
200.0000 mg | ORAL_CAPSULE | Freq: Three times a day (TID) | ORAL | Status: DC
  Administered 2014-04-11 – 2014-04-12 (×3): 200 mg via ORAL
  Filled 2014-04-11 (×4): qty 2

## 2014-04-11 MED ORDER — BENZONATATE 100 MG PO CAPS
100.0000 mg | ORAL_CAPSULE | Freq: Two times a day (BID) | ORAL | Status: DC | PRN
  Administered 2014-04-11 (×2): 100 mg via ORAL
  Filled 2014-04-11 (×2): qty 1

## 2014-04-11 MED ORDER — GI COCKTAIL ~~LOC~~
30.0000 mL | Freq: Once | ORAL | Status: AC
  Administered 2014-04-11: 30 mL via ORAL
  Filled 2014-04-11: qty 30

## 2014-04-11 NOTE — Progress Notes (Addendum)
PROGRESS NOTE  Kirk [name] [id number] DOB: 1960/04/19 DOA: 04/06/2014 PCP: Estill Dooms, MD  Summary: 54 year old man presented with shortness of breath, admitted for acute hypoxic respiratory failure, subsequently diagnosed with healthcare associated pneumonia.  Assessment/Plan: 1. Acute hypoxic respiratory failure secondary to pneumonia. Repeat CT with mixed findings with improvement in the lower lobes and some new infiltrates in the upper lobes. 2. Healthcare associated pneumonia, afebrile >4 days but still coughing and CT with evidence of some new infiltrates despite nearly 7 days of antibiotics. Strep and legionella urinary antigens negative. Influenza PCR negative.  3. Nausea without vomiting. Resolved. AXR non-acute 4. Diabetes mellitus type 2 with diabetic nephropathy. Remains stable. 5. Hypertension. Remains stable. 6. Anemia of chronic disease. Remains stable. 7. Leukopenia, stable. Suspect secondary to pneumonia. 8. Chronic thrombocytopenia. Improving.Follow-up as an outpatient. 9. End-stage renal disease. Per nephrology. 10. Obstructive sleep apnea, CPAP.   Patient is stable but remains hypoxic, short of breath and significant cough. He said to CT chest, initial CT ruled out pulmonary embolism. Repeat CT demonstrated some new infiltrates. However there is no evidence of complicating features, abscess or effusion.  Will change antibiotics to Zosyn.  Will consult pulmonology for any further recommendations  Check 2-D echocardiogram to rule out nonpulmonary etiology of hypoxia.  Discussed with wife at bedside  Code Status: full code DVT prophylaxis: SCDs Family Communication: none Disposition Plan: home  Murray Hodgkins, MD  [facility]  Pager [phone] If 7PM-7AM, please contact night-coverage at [email or web address], password [facility] 04/11/2014, 10:24 AM  LOS: 5 days   Consultants:    Procedures:    Antibiotics: Vancomycin 3/15 >> 3/21 Cefepime 3/15  >> 3/21  HPI/Subjective: Had a difficult night. Loss of coughing kept him up all night. He continues to feel short of breath. His nausea and vomiting have resolved. He has no abdominal pain and his bowels are moving. He is eating well.  Objective: Filed Vitals:   04/10/14 2100 04/11/14 0118 04/11/14 0642 04/11/14 0723  BP: 140/62  141/61   Pulse: 66  69   Temp: 98.4 F (36.9 C)  98.7 F (37.1 C)   TempSrc: Oral  Axillary   Resp: 18  18   Height:      Weight:      SpO2: 94% 98% 100% 100%    Intake/Output Summary (Last 24 hours) at 04/11/14 1024 Last data filed at 04/11/14 0919  Gross per 24 hour  Intake    606 ml  Output      0 ml  Net    606 ml     Filed Weights   04/08/14 0551 04/09/14 0641 04/10/14 0548  Weight: 104.781 kg (231 lb) 107.502 kg (237 lb) 108.591 kg (239 lb 6.4 oz)    Exam:     Afebrile x4 days,  VSS. General: Appears calm. Mildly uncomfortable Cardiovascular: RRR, no m/r/g. No LE edema. Respiratory: CTA bilaterally, no w/r/r.  mild increased respiratory effort. Speaks in full sentences. Abdomen soft, nontender nondistended.  Psychiatric: grossly normal mood and affect, speech fluent and appropriate Neurologic: grossly non-focal.  Data Reviewed:  Capillary blood sugars remain well controlled  Basic metabolic panel noted, nephrology managing   Hemoglobin stable 9.1, leukopenia stable, platelet count continues to improve  Abdominal film yesterday showed no evidence of bowel obstruction, retained barium was seen within the colon.  Blood cultures no growth, final  Urine culture multiple morphotypes non-predominant  Repeat CT of the chest demonstrates mild patchy bilateral pulmonary infiltrates consistent with pneumonia,  there are a few new sites of patchy involvement in the upper lobes and improvement in lower lobes.   Scheduled Meds: . amLODipine  10 mg Oral Daily  . aspirin EC  81 mg Oral Daily  . carvedilol  25 mg Oral BID  . ceFEPime  (MAXIPIME) IV  2 g Intravenous Q M,W,F-HD  . doxazosin  2 mg Oral Q12H  . epoetin (EPOGEN/PROCRIT) injection  10,000 Units Subcutaneous Q M,W,F-HD  . guaiFENesin  600 mg Oral BID  . insulin aspart  0-5 Units Subcutaneous QHS  . insulin aspart  0-9 Units Subcutaneous TID WC  . insulin aspart  3 Units Subcutaneous TID WC  . ipratropium-albuterol  3 mL Nebulization Q6H  . lanthanum  1,500 mg Oral TID WC  . polyethylene glycol  17 g Oral BID  . pravastatin  40 mg Oral QPM  . sodium chloride  3 mL Intravenous Q12H  . vancomycin  1,000 mg Intravenous Q M,W,F-HD   Continuous Infusions:   Principal Problem:   Acute respiratory failure with hypoxia Active Problems:   OSA on CPAP   HTN (hypertension)   DM (diabetes mellitus), type 2 with renal complications   Anemia in chronic renal disease   History of kidney transplant   End-stage renal disease on hemodialysis   HCAP (healthcare-associated pneumonia)   Thrombocytopenia   Time spent 25 minutes

## 2014-04-11 NOTE — Progress Notes (Signed)
Pt complaining of chest tightness and discomfort to Left side of chest. Pt reports difficulty breathing. Vitals are as follows B/P 142/66, Pulse 67, O2 sat 99%. MD notified and 12 lead EKG ordered. Respiratory at bedside. Will continue to monitor patient.

## 2014-04-11 NOTE — Progress Notes (Signed)
Subjective: Interval History: Patient complains of some nausea this morning. He doesn't have any vomiting. Patient however has vomited yesterday. He complains of still cough with sputum production. However this seems to be getting better. He doesn't have any fevers chills or sweating. Denies any difficulty in breathing.  Objective: Vital signs in last 24 hours: Temp:  [98.4 F (36.9 C)-98.8 F (37.1 C)] 98.7 F (37.1 C) (03/20 0642) Pulse Rate:  [65-72] 69 (03/20 0642) Resp:  [16-18] 18 (03/20 0642) BP: (140-151)/(61-71) 141/61 mmHg (03/20 0642) SpO2:  [94 %-100 %] 100 % (03/20 0723) Weight change:   Intake/Output from previous day: 03/19 0701 - 03/20 0700 In: 243 [P.O.:240; I.V.:3] Out: -  Intake/Output this shift: Total I/O In: 3 [I.V.:3] Out: -   General appearance: alert, cooperative and no distress Resp: clear to auscultation bilaterally Cardio: regular rate and rhythm, S1, S2 normal, no murmur, click, rub or gallop GI: soft, non-tender; bowel sounds normal; no masses,  no organomegaly Extremities: extremities normal, atraumatic, no cyanosis or edema  Lab Results:  Recent Labs  04/09/14 0705 04/11/14 0643  WBC 2.8* 3.6*  HGB 8.9* 9.3*  HCT 26.6* 27.6*  PLT 75* 87*   BMET:   Recent Labs  04/09/14 0705 04/11/14 0643  NA 137 137  K 5.2* 4.6  CL 99 98  CO2 24 27  GLUCOSE 106* 104*  BUN 69* 54*  CREATININE 13.77* 11.86*  CALCIUM 8.0* 8.3*   No results for input(s): PTH in the last 72 hours. Iron Studies: No results for input(s): IRON, TIBC, TRANSFERRIN, FERRITIN in the last 72 hours.  Studies/Results: Dg Abd 1 View  04/10/2014   CLINICAL DATA:  Epigastric pain.  EXAM: ABDOMEN - 1 VIEW  COMPARISON:  CT 02/17/2014  FINDINGS: There is dense barium within the transverse colon, descending colon and rectosigmoid colon similar to comparison CT. No dilated to large or small bowel.  IMPRESSION: No evidence of bowel obstruction.  Retained barium within the colon.    Electronically Signed   By: Suzy Bouchard M.D.   On: 04/10/2014 17:53    I have reviewed the patient's current medications.  Assessment/Plan: Problem #1 Healthcare associated Pneumonia: Still patient with cough but improving. Problem #2 end-stage renal disease: Status post hemodialysis on Friday. Presently he has some nausea and occasional vomiting. This problem however is chronic. At this moment there is no significant change except his left with more frequent. Problem #3 anemia: His hemoglobin is below the target goal and patient is on Epogen. Problem #4 metabolic bone disease: His calcium is range but his phosphorus is high. Presently patient is on Fosrenol 1500 mg by mouth 3 times a day with meals and 500 with snack. Problem #5 history of hypertension: His blood pressure is reasonably controlled Problem #6 history of sleep apnea: Patient on CPAP at home Problem #7 history of diabetes Plan: We'll make arrangements for patient to get dialysis tomorrow. We'll basic metabolic panel  and CBC in the morning.   LOS: 5 days   Gerrald Basu S 04/11/2014,9:43 AM

## 2014-04-12 ENCOUNTER — Inpatient Hospital Stay (HOSPITAL_COMMUNITY): Payer: Medicare Other

## 2014-04-12 LAB — GLUCOSE, CAPILLARY
GLUCOSE-CAPILLARY: 135 mg/dL — AB (ref 70–99)
GLUCOSE-CAPILLARY: 134 mg/dL — AB (ref 70–99)
Glucose-Capillary: 98 mg/dL (ref 70–99)
Glucose-Capillary: 157 mg/dL — ABNORMAL HIGH (ref 70–99)

## 2014-04-12 LAB — CBC
HEMATOCRIT: 27 % — AB (ref 39.0–52.0)
HEMOGLOBIN: 9.1 g/dL — AB (ref 13.0–17.0)
MCH: 32.9 pg (ref 26.0–34.0)
MCHC: 33.7 g/dL (ref 30.0–36.0)
MCV: 97.5 fL (ref 78.0–100.0)
Platelets: 99 10*3/uL — ABNORMAL LOW (ref 150–400)
RBC: 2.77 MIL/uL — ABNORMAL LOW (ref 4.22–5.81)
RDW: 12.5 % (ref 11.5–15.5)
WBC: 3.4 10*3/uL — AB (ref 4.0–10.5)

## 2014-04-12 LAB — BASIC METABOLIC PANEL
ANION GAP: 14 (ref 5–15)
BUN: 69 mg/dL — ABNORMAL HIGH (ref 6–23)
CHLORIDE: 98 mmol/L (ref 96–112)
CO2: 24 mmol/L (ref 19–32)
Calcium: 8.2 mg/dL — ABNORMAL LOW (ref 8.4–10.5)
Creatinine, Ser: 14.61 mg/dL — ABNORMAL HIGH (ref 0.50–1.35)
GFR calc Af Amer: 4 mL/min — ABNORMAL LOW (ref >90)
GFR calc non Af Amer: 3 mL/min — ABNORMAL LOW (ref >90)
Glucose, Bld: 106 mg/dL — ABNORMAL HIGH (ref 70–99)
Potassium: 4.8 mmol/L (ref 3.5–5.1)
SODIUM: 136 mmol/L (ref 135–145)

## 2014-04-12 LAB — VANCOMYCIN, RANDOM: VANCOMYCIN RM: 20.3 ug/mL

## 2014-04-12 MED ORDER — SODIUM CHLORIDE 0.9 % IV SOLN: 100.0000 mL | INTRAVENOUS | Status: DC | PRN

## 2014-04-12 MED ORDER — ALTEPLASE 2 MG IJ SOLR
2.0000 mg | Freq: Once | INTRAMUSCULAR | Status: AC | PRN
  Filled 2014-04-12: qty 2

## 2014-04-12 MED ORDER — BENZONATATE 100 MG PO CAPS
200.0000 mg | ORAL_CAPSULE | Freq: Three times a day (TID) | ORAL | Status: DC | PRN
  Administered 2014-04-13: 200 mg via ORAL
  Filled 2014-04-12: qty 2

## 2014-04-12 MED ORDER — GUAIFENESIN-DM 100-10 MG/5ML PO SYRP
5.0000 mL | ORAL_SOLUTION | ORAL | Status: DC | PRN
  Administered 2014-04-12 – 2014-04-13 (×3): 5 mL via ORAL
  Filled 2014-04-12 (×3): qty 5

## 2014-04-12 MED ORDER — PIPERACILLIN-TAZOBACTAM IN DEX 2-0.25 GM/50ML IV SOLN
2.2500 g | Freq: Three times a day (TID) | INTRAVENOUS | Status: DC
  Administered 2014-04-12 – 2014-04-13 (×2): 2.25 g via INTRAVENOUS
  Filled 2014-04-12 (×3): qty 50

## 2014-04-12 MED ORDER — EPOETIN ALFA 10000 UNIT/ML IJ SOLN
INTRAMUSCULAR | Status: AC
  Filled 2014-04-12: qty 1

## 2014-04-12 MED ORDER — PIPERACILLIN-TAZOBACTAM IN DEX 2-0.25 GM/50ML IV SOLN
INTRAVENOUS | Status: AC
  Filled 2014-04-12: qty 100

## 2014-04-12 NOTE — Procedures (Signed)
   HEMODIALYSIS TREATMENT NOTE:  4 hour dialysis completed via left forearm AVF (15g buttonhole ante/retrograde).  Goal met:  3.2 liters removed without interruption in ultrafiltration.  All blood reinfused.  Hemostasis achieved within 10 minutes.  Vancomycin, Maxipime and epogen given with HD.  Report called to Gershon Crane, RN.  Rockwell Alexandria, RN, CDN

## 2014-04-12 NOTE — Progress Notes (Signed)
Subjective: Interval History: Patient complains of recurrent cough and sputum production which seems to be worst last night. Patient was given some cough medication without much improvement. Patient also has a little bit of difficulty breathing. He denies any fever or or chills.  Objective: Vital signs in last 24 hours: Temp:  [97.9 F (36.6 C)-99.3 F (37.4 C)] 98.1 F (36.7 C) (03/21 0534) Pulse Rate:  [67-96] 68 (03/21 0534) Resp:  [18-20] 20 (03/21 0534) BP: (130-152)/(66-90) 152/68 mmHg (03/21 0534) SpO2:  [92 %-100 %] 93 % (03/21 0744) Weight change:   Intake/Output from previous day: 03/20 0701 - 03/21 0700 In: 923 [P.O.:920; I.V.:3] Out: 250 [Urine:250] Intake/Output this shift:   Generally patient is alert and in no apparent distress. He was coughing. His sputum production seems to be mostly white. Chest: Decreased breath sound bilaterally he has also some expiratory wheezing. His heart exam regular rate and rhythm no murmur Extremities no edema   Lab Results:  Recent Labs  04/11/14 0643 04/12/14 0542  WBC 3.6* 3.4*  HGB 9.3* 9.1*  HCT 27.6* 27.0*  PLT 87* 99*   BMET:   Recent Labs  04/11/14 0643 04/12/14 0542  NA 137 136  K 4.6 4.8  CL 98 98  CO2 27 24  GLUCOSE 104* 106*  BUN 54* 69*  CREATININE 11.86* 14.61*  CALCIUM 8.3* 8.2*   No results for input(s): PTH in the last 72 hours. Iron Studies: No results for input(s): IRON, TIBC, TRANSFERRIN, FERRITIN in the last 72 hours.  Studies/Results: Dg Abd 1 View  04/10/2014   CLINICAL DATA:  Epigastric pain.  EXAM: ABDOMEN - 1 VIEW  COMPARISON:  CT 02/17/2014  FINDINGS: There is dense barium within the transverse colon, descending colon and rectosigmoid colon similar to comparison CT. No dilated to large or small bowel.  IMPRESSION: No evidence of bowel obstruction.  Retained barium within the colon.   Electronically Signed   By: Suzy Bouchard M.D.   On: 04/10/2014 17:53    I have reviewed the  patient's current medications.  Assessment/Plan: Problem #1 Healthcare associated Pneumonia: Patient continued to complaints of cough. Patient is getting some cough medication without much improvement. Presently is a febrile and his white blood cell count is slightly low. Problem #2 end-stage renal disease: Status post hemodialysis on Friday. Patient is due for dialysis today. He doesn't have any nausea or vomiting. Problem #3 anemia: His hemoglobin is below the target goal and patient is on Epogen. His hemoglobin is stable. Problem #4 metabolic bone disease: His calcium is range but his phosphorus is high. Presently patient is on Fosrenol 1500 mg by mouth 3 times a day with meals and 500 with snack. Problem #5 history of hypertension: His blood pressure is reasonably controlled Problem #6 history of sleep apnea: Patient on CPAP at home Problem #7 history of diabetes Plan: We'll make arrangements for patient to get dialysis today We'll remove about 3 and half liters if his blood pressure tolerates. We'll check his basic metabolic panel and phosphorus in the morning. Plan: We'll make arrangements for patient to get dialysis tomorrow. We'll basic metabolic panel  and CBC in the morning.   LOS: 6 days   Ardelia Wrede S 04/12/2014,8:31 AM

## 2014-04-12 NOTE — Progress Notes (Signed)
Patient ambulated without oxygen and maintained an oxygen sat of 89-90% but became short of breath and weak.

## 2014-04-12 NOTE — Progress Notes (Signed)
ANTIBIOTIC CONSULT NOTE  Pharmacy Consult for Zosyn Indication: HCAP  Allergies  Allergen Reactions  . Codeine Nausea And Vomiting and Nausea Only   Patient Measurements: Height: 5\' 7"  (170.2 cm) Weight: 227 lb 15.3 oz (103.4 kg) IBW/kg (Calculated) : 66.1  Vital Signs: Temp: 98.2 F (36.8 C) (03/21 1606) Temp Source: Oral (03/21 1606) BP: 159/67 mmHg (03/21 1606) Pulse Rate: 86 (03/21 1606) Intake/Output from previous day: 03/20 0701 - 03/21 0700 In: 923 [P.O.:920; I.V.:3] Out: 250 [Urine:250] Intake/Output from this shift: Total I/O In: -  Out: 3200 [Other:3200]  Labs:  Recent Labs  04/11/14 0643 04/12/14 0542  WBC 3.6* 3.4*  HGB 9.3* 9.1*  PLT 87* 99*  CREATININE 11.86* 14.61*   Estimated Creatinine Clearance: 6.8 mL/min (by C-G formula based on Cr of 14.61).  Recent Labs  04/12/14 0542  VANCORANDOM 20.3    Microbiology: Recent Results (from the past 720 hour(s))  Culture, blood (routine x 2)     Status: None   Collection Time: 04/06/14  5:36 AM  Result Value Ref Range Status   Specimen Description BLOOD RIGHT HAND  Final   Special Requests BOTTLES DRAWN AEROBIC AND ANAEROBIC 12CC EACH  Final   Culture NO GROWTH 5 DAYS  Final   Report Status 04/11/2014 FINAL  Final  MRSA PCR Screening     Status: None   Collection Time: 04/06/14  6:23 AM  Result Value Ref Range Status   MRSA by PCR NEGATIVE NEGATIVE Final    Comment:        The GeneXpert MRSA Assay (FDA approved for NASAL specimens only), is one component of a comprehensive MRSA colonization surveillance program. It is not intended to diagnose MRSA infection nor to guide or monitor treatment for MRSA infections.   Culture, blood (routine x 2)     Status: None   Collection Time: 04/06/14  7:44 AM  Result Value Ref Range Status   Specimen Description BLOOD RIGHT HAND  Final   Special Requests   Final    BOTTLES DRAWN AEROBIC AND ANAEROBIC AEB=10CC ANA=8CC   Culture NO GROWTH 5 DAYS  Final    Report Status 04/11/2014 FINAL  Final  Culture, Urine     Status: None   Collection Time: 04/06/14 10:30 AM  Result Value Ref Range Status   Specimen Description URINE, CLEAN CATCH  Final   Special Requests NONE  Final   Colony Count   Final    50,000 COLONIES/ML Performed at [facility]    Culture   Final    Multiple bacterial morphotypes present, none predominant. Suggest appropriate recollection if clinically indicated. Performed at [facility]    Report Status 04/07/2014 FINAL  Final  Culture, sputum-assessment     Status: None   Collection Time: 04/11/14 11:46 AM  Result Value Ref Range Status   Specimen Description SPUTUM  Final   Special Requests anergy or immune suppression.  Final   Sputum evaluation   Final    MICROSCOPIC FINDINGS SUGGEST THAT THIS SPECIMEN IS NOT REPRESENTATIVE OF LOWER RESPIRATORY SECRETIONS. PLEASE RECOLLECT. Gram Stain Report Called to,Read Back By and Verified With: L.BULLINS AT [id number] ON 04/11/14 BY S.VANHOORNE    Report Status 04/11/2014 FINAL  Final    Anti-infectives    Start     Dose/Rate Route Frequency Ordered Stop   04/12/14 2000  piperacillin-tazobactam (ZOSYN) IVPB 2.25 g     2.25 g 100 mL/hr over 30 Minutes Intravenous Every 8 hours 04/12/14 1835  04/07/14 1200  ceFEPIme (MAXIPIME) 2 g in dextrose 5 % 50 mL IVPB  Status:  Discontinued     2 g 100 mL/hr over 30 Minutes Intravenous Every M-W-F (Hemodialysis) 04/07/14 0828 04/12/14 1800   04/07/14 1200  vancomycin (VANCOCIN) IVPB 1000 mg/200 mL premix  Status:  Discontinued     1,000 mg 200 mL/hr over 60 Minutes Intravenous Every M-W-F (Hemodialysis) 04/07/14 0828 04/12/14 1800   04/06/14 2000  vancomycin (VANCOCIN) 1,500 mg in sodium chloride 0.9 % 500 mL IVPB     1,500 mg 250 mL/hr over 120 Minutes Intravenous  Once 04/06/14 1823 04/06/14 2316   04/06/14 1930  ceFEPIme (MAXIPIME) 2 g in dextrose 5 % 50 mL IVPB     2 g 100 mL/hr over 30 Minutes Intravenous  Once  04/06/14 1823 04/06/14 2142   04/06/14 1800  cefTRIAXone (ROCEPHIN) 1 g in dextrose 5 % 50 mL IVPB - Premix  Status:  Discontinued     1 g 100 mL/hr over 30 Minutes Intravenous Every 24 hours 04/06/14 1759 04/06/14 1804   04/06/14 1800  azithromycin (ZITHROMAX) 500 mg in dextrose 5 % 250 mL IVPB  Status:  Discontinued     500 mg 250 mL/hr over 60 Minutes Intravenous Every 24 hours 04/06/14 1759 04/06/14 1804     Assessment: 68 yoM with ESRD (MWF HD) was previously on Vancomycin & Cefepime for HCAP.   Now asked to switch to Zosyn He is afebrile with normal WBC.  Cx data NGTD. CXR + PNA.    Vancomycin 3/15>>3/21 Cefepime 3/15>>3/21 Zosyn 3/21  Goal of Therapy:  Eradicate infection.   Plan:  Zosyn 2.25gm IV q8h F/U cx data and patient progress Duration of therapy per MD- consider change to PO abx once clinically appropriate  Hart Robinsons A 04/12/2014,6:36 PM

## 2014-04-12 NOTE — Progress Notes (Signed)
[facility] for Vancomycin and Cefepime  Indication: HCAP  Allergies  Allergen Reactions  . Codeine Nausea And Vomiting and Nausea Only   Patient Measurements: Height: 5\' 7"  (170.2 cm) Weight: 239 lb 6.4 oz (108.591 kg) IBW/kg (Calculated) : 66.1  Vital Signs: Temp: 98.1 F (36.7 C) (03/21 0534) Temp Source: Oral (03/21 0534) BP: 152/68 mmHg (03/21 0534) Pulse Rate: 68 (03/21 0534) Intake/Output from previous day: 03/20 0701 - 03/21 0700 In: 923 [P.O.:920; I.V.:3] Out: 250 [Urine:250] Intake/Output from this shift:    Labs:  Recent Labs  04/11/14 0643 04/12/14 0542  WBC 3.6* 3.4*  HGB 9.3* 9.1*  PLT 87* 99*  CREATININE 11.86* 14.61*   Estimated Creatinine Clearance: 7 mL/min (by C-G formula based on Cr of 14.61).  Recent Labs  04/12/14 0542  VANCORANDOM 20.3    Microbiology: Recent Results (from the past 720 hour(s))  Culture, blood (routine x 2)     Status: None   Collection Time: 04/06/14  5:36 AM  Result Value Ref Range Status   Specimen Description BLOOD RIGHT HAND  Final   Special Requests BOTTLES DRAWN AEROBIC AND ANAEROBIC 12CC EACH  Final   Culture NO GROWTH 5 DAYS  Final   Report Status 04/11/2014 FINAL  Final  MRSA PCR Screening     Status: None   Collection Time: 04/06/14  6:23 AM  Result Value Ref Range Status   MRSA by PCR NEGATIVE NEGATIVE Final    Comment:        The GeneXpert MRSA Assay (FDA approved for NASAL specimens only), is one component of a comprehensive MRSA colonization surveillance program. It is not intended to diagnose MRSA infection nor to guide or monitor treatment for MRSA infections.   Culture, blood (routine x 2)     Status: None   Collection Time: 04/06/14  7:44 AM  Result Value Ref Range Status   Specimen Description BLOOD RIGHT HAND  Final   Special Requests   Final    BOTTLES DRAWN AEROBIC AND ANAEROBIC AEB=10CC ANA=8CC   Culture NO GROWTH 5 DAYS  Final   Report Status  04/11/2014 FINAL  Final  Culture, Urine     Status: None   Collection Time: 04/06/14 10:30 AM  Result Value Ref Range Status   Specimen Description URINE, CLEAN CATCH  Final   Special Requests NONE  Final   Colony Count   Final    50,000 COLONIES/ML Performed at [facility]    Culture   Final    Multiple bacterial morphotypes present, none predominant. Suggest appropriate recollection if clinically indicated. Performed at [facility]    Report Status 04/07/2014 FINAL  Final  Culture, sputum-assessment     Status: None   Collection Time: 04/11/14 11:46 AM  Result Value Ref Range Status   Specimen Description SPUTUM  Final   Special Requests anergy or immune suppression.  Final   Sputum evaluation   Final    MICROSCOPIC FINDINGS SUGGEST THAT THIS SPECIMEN IS NOT REPRESENTATIVE OF LOWER RESPIRATORY SECRETIONS. PLEASE RECOLLECT. Gram Stain Report Called to,Read Back By and Verified With: L.BULLINS AT [id number] ON 04/11/14 BY S.VANHOORNE    Report Status 04/11/2014 FINAL  Final    Anti-infectives    Start     Dose/Rate Route Frequency Ordered Stop   04/07/14 1200  ceFEPIme (MAXIPIME) 2 g in dextrose 5 % 50 mL IVPB     2 g 100 mL/hr over 30 Minutes Intravenous Every M-W-F (Hemodialysis)  04/07/14 0828     04/07/14 1200  vancomycin (VANCOCIN) IVPB 1000 mg/200 mL premix     1,000 mg 200 mL/hr over 60 Minutes Intravenous Every M-W-F (Hemodialysis) 04/07/14 0828     04/06/14 2000  vancomycin (VANCOCIN) 1,500 mg in sodium chloride 0.9 % 500 mL IVPB     1,500 mg 250 mL/hr over 120 Minutes Intravenous  Once 04/06/14 1823 04/06/14 2316   04/06/14 1930  ceFEPIme (MAXIPIME) 2 g in dextrose 5 % 50 mL IVPB     2 g 100 mL/hr over 30 Minutes Intravenous  Once 04/06/14 1823 04/06/14 2142   04/06/14 1800  cefTRIAXone (ROCEPHIN) 1 g in dextrose 5 % 50 mL IVPB - Premix  Status:  Discontinued     1 g 100 mL/hr over 30 Minutes Intravenous Every 24 hours 04/06/14 1759 04/06/14 1804    04/06/14 1800  azithromycin (ZITHROMAX) 500 mg in dextrose 5 % 250 mL IVPB  Status:  Discontinued     500 mg 250 mL/hr over 60 Minutes Intravenous Every 24 hours 04/06/14 1759 04/06/14 1804     Assessment: 18 yoM with ESRD (MWF HD) currently on day#7 Vancomycin & Cefepime for HCAP.   He is afebrile with normal WBC.  Cx data NGTD. CXR + PNA.  Pre-HD level is on target.  Vancomycin 3/15>> Cefepime 3/15>>  Goal of Therapy:  Eradicate infection.  Pre-Hemodialysis Vancomycin level goal range =15-25 mcg/ml  Plan:  Cefepime 2gm qHD Vancomycin 1gm qHD Monitor pre-HD level weekly or as clinically warranted. F/U cx data and patient progress Duration of therapy per MD- consider change to PO abx once clinically appropriate  Hart Robinsons A 04/12/2014,10:47 AM

## 2014-04-13 ENCOUNTER — Inpatient Hospital Stay (HOSPITAL_COMMUNITY): Payer: Medicare Other

## 2014-04-13 DIAGNOSIS — R0600 Dyspnea, unspecified: Secondary | ICD-10-CM

## 2014-04-13 LAB — BLOOD GAS, ARTERIAL
ACID-BASE EXCESS: 3.4 mmol/L — AB (ref 0.0–2.0)
Bicarbonate: 27 mEq/L — ABNORMAL HIGH (ref 20.0–24.0)
DRAWN BY: 234301
FIO2: 55 %
O2 Saturation: 89.5 %
PATIENT TEMPERATURE: 37
TCO2: 22.1 mmol/L (ref 0–100)
pCO2 arterial: 38.4 mmHg (ref 35.0–45.0)
pH, Arterial: 7.462 — ABNORMAL HIGH (ref 7.350–7.450)
pO2, Arterial: 59.4 mmHg — ABNORMAL LOW (ref 80.0–100.0)

## 2014-04-13 LAB — BASIC METABOLIC PANEL
ANION GAP: 11 (ref 5–15)
BUN: 39 mg/dL — ABNORMAL HIGH (ref 6–23)
CALCIUM: 8.8 mg/dL (ref 8.4–10.5)
CO2: 28 mmol/L (ref 19–32)
Chloride: 101 mmol/L (ref 96–112)
Creatinine, Ser: 9.89 mg/dL — ABNORMAL HIGH (ref 0.50–1.35)
GFR, EST AFRICAN AMERICAN: 6 mL/min — AB (ref >90)
GFR, EST NON AFRICAN AMERICAN: 5 mL/min — AB (ref >90)
Glucose, Bld: 116 mg/dL — ABNORMAL HIGH (ref 70–99)
POTASSIUM: 4.4 mmol/L (ref 3.5–5.1)
SODIUM: 140 mmol/L (ref 135–145)

## 2014-04-13 LAB — GLUCOSE, CAPILLARY
GLUCOSE-CAPILLARY: 137 mg/dL — AB (ref 70–99)
Glucose-Capillary: 134 mg/dL — ABNORMAL HIGH (ref 70–99)
Glucose-Capillary: 170 mg/dL — ABNORMAL HIGH (ref 70–99)
Glucose-Capillary: 205 mg/dL — ABNORMAL HIGH (ref 70–99)

## 2014-04-13 LAB — PHOSPHORUS: Phosphorus: 4.1 mg/dL (ref 2.3–4.6)

## 2014-04-13 MED ORDER — IPRATROPIUM-ALBUTEROL 0.5-2.5 (3) MG/3ML IN SOLN
3.0000 mL | RESPIRATORY_TRACT | Status: DC
  Administered 2014-04-13 – 2014-04-16 (×17): 3 mL via RESPIRATORY_TRACT
  Filled 2014-04-13 (×17): qty 3

## 2014-04-13 MED ORDER — GUAIFENESIN-DM 100-10 MG/5ML PO SYRP
10.0000 mL | ORAL_SOLUTION | ORAL | Status: DC | PRN
  Administered 2014-04-14: 10 mL via ORAL
  Filled 2014-04-13: qty 10

## 2014-04-13 MED ORDER — METHYLPREDNISOLONE SODIUM SUCC 125 MG IJ SOLR
60.0000 mg | Freq: Four times a day (QID) | INTRAMUSCULAR | Status: DC
  Administered 2014-04-13 – 2014-04-15 (×8): 60 mg via INTRAVENOUS
  Filled 2014-04-13 (×8): qty 2

## 2014-04-13 MED ORDER — LEVOFLOXACIN IN D5W 500 MG/100ML IV SOLN
500.0000 mg | INTRAVENOUS | Status: DC
  Administered 2014-04-15: 500 mg via INTRAVENOUS
  Filled 2014-04-13: qty 100

## 2014-04-13 MED ORDER — ALPRAZOLAM 0.25 MG PO TABS
0.2500 mg | ORAL_TABLET | Freq: Three times a day (TID) | ORAL | Status: DC | PRN
  Administered 2014-04-13 (×2): 0.25 mg via ORAL
  Filled 2014-04-13 (×2): qty 1

## 2014-04-13 MED ORDER — LEVOFLOXACIN IN D5W 750 MG/150ML IV SOLN
750.0000 mg | Freq: Once | INTRAVENOUS | Status: AC
Start: 2014-04-13 — End: 2014-04-13
  Administered 2014-04-13: 750 mg via INTRAVENOUS
  Filled 2014-04-13: qty 150

## 2014-04-13 MED ORDER — LEVOFLOXACIN IN D5W 750 MG/150ML IV SOLN: 750.0000 mg | INTRAVENOUS | Status: DC

## 2014-04-13 MED ORDER — GUAIFENESIN ER 600 MG PO TB12
1200.0000 mg | ORAL_TABLET | Freq: Two times a day (BID) | ORAL | Status: DC
  Administered 2014-04-13 – 2014-04-16 (×8): 1200 mg via ORAL
  Filled 2014-04-13 (×8): qty 2

## 2014-04-13 MED ORDER — METHYLPREDNISOLONE SODIUM SUCC 40 MG IJ SOLR
40.0000 mg | INTRAMUSCULAR | Status: DC
  Administered 2014-04-13: 40 mg via INTRAVENOUS
  Filled 2014-04-13: qty 1

## 2014-04-13 MED ORDER — PIPERACILLIN SOD-TAZOBACTAM SO 2.25 (2-0.25) G IV SOLR
2.2500 g | Freq: Three times a day (TID) | INTRAVENOUS | Status: DC
  Administered 2014-04-13 – 2014-04-15 (×7): 2.25 g via INTRAVENOUS
  Filled 2014-04-13 (×10): qty 2.25

## 2014-04-13 NOTE — Progress Notes (Signed)
Kirk [name]  MRN: [id number]  DOB/AGE: 1960-02-12 54 y.o.  Primary Care Physician:GREEN, [name], MD  Admit date: 04/06/2014  Chief Complaint:  Chief Complaint  Patient presents with  . Shortness of Breath    S-Pt presented on  04/06/2014 with  Chief Complaint  Patient presents with  . Shortness of Breath  .    Pt today feels better but says " I still have cough with expectoration"  Meds . amLODipine  10 mg Oral Daily  . aspirin EC  81 mg Oral Daily  . carvedilol  25 mg Oral BID  . doxazosin  2 mg Oral Q12H  . epoetin (EPOGEN/PROCRIT) injection  10,000 Units Subcutaneous Q M,W,F-HD  . guaiFENesin  1,200 mg Oral BID  . insulin aspart  0-5 Units Subcutaneous QHS  . insulin aspart  0-9 Units Subcutaneous TID WC  . insulin aspart  3 Units Subcutaneous TID WC  . ipratropium-albuterol  3 mL Nebulization Q6H  . lanthanum  1,500 mg Oral TID WC  . methylPREDNISolone (SOLU-MEDROL) injection  40 mg Intravenous Q24H  . piperacillin-tazobactam (ZOSYN)  IV  2.25 g Intravenous Q8H  . polyethylene glycol  17 g Oral BID  . pravastatin  40 mg Oral QPM  . sodium chloride  3 mL Intravenous Q12H      Physical Exam: Vital signs in last 24 hours: Temp:  [98.1 F (36.7 C)-99.1 F (37.3 C)] 98.2 F (36.8 C) (03/22 0543) Pulse Rate:  [72-89] 82 (03/22 0543) Resp:  [18-20] 18 (03/22 0543) BP: (142-197)/(67-89) 167/89 mmHg (03/22 0543) SpO2:  [90 %-98 %] 93 % (03/22 0742) Weight:  [227 lb 15.3 oz (103.4 kg)-235 lb 10.8 oz (106.9 kg)] 231 lb (104.781 kg) (03/22 0543) Weight change:  Last BM Date: 04/12/14  Intake/Output from previous day: 03/21 0701 - 03/22 0700 In: 240 [P.O.:240] Out: 3600 [Urine:400]     Physical Exam: Kirk- pt is awake,alert, oriented to time place and person Resp- No REsp distress, Rhonchi+ CVS- S1S2 regular in rate and rhythm GIT- BS+, soft, NT, ND, surgical scar for transplant present EXT- NO LE Edema, Cyanosis Access-AVF+   Lab  Results: CBC  Recent Labs  04/11/14 0643 04/12/14 0542  WBC 3.6* 3.4*  HGB 9.3* 9.1*  HCT 27.6* 27.0*  PLT 87* 99*    BMET  Recent Labs  04/12/14 0542 04/13/14 0545  NA 136 140  K 4.8 4.4  CL 98 101  CO2 24 28  GLUCOSE 106* 116*  BUN 69* 39*  CREATININE 14.61* 9.89*  CALCIUM 8.2* 8.8    MICRO Recent Results (from the past 240 hour(s))  Culture, blood (routine x 2)     Status: None   Collection Time: 04/06/14  5:36 AM  Result Value Ref Range Status   Specimen Description BLOOD RIGHT HAND  Final   Special Requests BOTTLES DRAWN AEROBIC AND ANAEROBIC 12CC EACH  Final   Culture NO GROWTH 5 DAYS  Final   Report Status 04/11/2014 FINAL  Final  MRSA PCR Screening     Status: None   Collection Time: 04/06/14  6:23 AM  Result Value Ref Range Status   MRSA by PCR NEGATIVE NEGATIVE Final    Comment:        The GeneXpert MRSA Assay (FDA approved for NASAL specimens only), is one component of a comprehensive MRSA colonization surveillance program. It is not intended to diagnose MRSA infection nor to guide or monitor treatment for MRSA infections.   Culture, blood (routine x  2)     Status: None   Collection Time: 04/06/14  7:44 AM  Result Value Ref Range Status   Specimen Description BLOOD RIGHT HAND  Final   Special Requests   Final    BOTTLES DRAWN AEROBIC AND ANAEROBIC AEB=10CC ANA=8CC   Culture NO GROWTH 5 DAYS  Final   Report Status 04/11/2014 FINAL  Final  Culture, Urine     Status: None   Collection Time: 04/06/14 10:30 AM  Result Value Ref Range Status   Specimen Description URINE, CLEAN CATCH  Final   Special Requests NONE  Final   Colony Count   Final    50,000 COLONIES/ML Performed at [facility]    Culture   Final    Multiple bacterial morphotypes present, none predominant. Suggest appropriate recollection if clinically indicated. Performed at [facility]    Report Status 04/07/2014 FINAL  Final  Culture, sputum-assessment      Status: None   Collection Time: 04/11/14 11:46 AM  Result Value Ref Range Status   Specimen Description SPUTUM  Final   Special Requests anergy or immune suppression.  Final   Sputum evaluation   Final    MICROSCOPIC FINDINGS SUGGEST THAT THIS SPECIMEN IS NOT REPRESENTATIVE OF LOWER RESPIRATORY SECRETIONS. PLEASE RECOLLECT. Gram Stain Report Called to,Read Back By and Verified With: L.BULLINS AT [id number] ON 04/11/14 BY S.VANHOORNE    Report Status 04/11/2014 FINAL  Final      Lab Results  Component Value Date   PTH 93.1* 01/19/2010   CALCIUM 8.8 04/13/2014   CAION 1.02* 09/08/2013   PHOS 4.1 04/13/2014               Impression: 1)Renal ESRD on Hd  On MWF schedule Pt was last dialyzed yesterday   2)HTN  Medication- On Calcium Channel Blockers On Alpha and beta Blockers   3)Anemia HGb just at goal (9--11) On Epo during HD  4)CKD Mineral-Bone Disorder Secondary Hyperparathyroidism present  Phosphorus at goal.   5)ID- admitted with Pneumonia Primary MD following  6)Electrolytes Normokalemic NOrmonatremic   7)Acid base Co2 at goal  Plan:  Will continue current care Will dialyze in am     Kirk [name] S 04/13/2014, 9:04 AM

## 2014-04-13 NOTE — Progress Notes (Signed)
PROGRESS NOTE  Kirk [name] [id number] DOB: 1960-03-25 DOA: 04/06/2014 PCP: Estill Dooms, MD  Summary: 54 year old man presented with shortness of breath, admitted for acute hypoxic respiratory failure, subsequently diagnosed with healthcare associated pneumonia.  Assessment/Plan: 1. Acute hypoxic respiratory failure secondary to pneumonia. Repeat CT with mixed findings with improvement in the lower lobes and some new infiltrates in the upper lobes. Patient has worsening shortness of breath today. He reports some improvement when he receive neb treatments. Discussed with Dr. Luan Pulling and he was started on steroids this morning. Will increase solumedrol from daily to q6hr. He is currently on ventimask, will try and de escalate oxygen as tolerated. Repeat chest xray today and check abg. 2. Healthcare associated pneumonia, afebrile, but still coughing and CT with evidence of some new infiltrates despite more than 7 days of antibiotics. Strep and legionella urinary antigens negative. Influenza PCR negative. Antibiotics changed from cefepime to zosyn. Appreciate pulmonology input. 3. Nausea without vomiting. Resolved. AXR non-acute 4. Diabetes mellitus type 2 with diabetic nephropathy. Remains stable. 5. Hypertension. Remains stable. 6. Anemia of chronic disease. Remains stable. 7. Leukopenia, stable. Suspect secondary to pneumonia. HIV screen negative. 8. Chronic thrombocytopenia. Improving.Follow-up as an outpatient. 9. End-stage renal disease. Per nephrology. 10. Obstructive sleep apnea, CPAP.  Code Status: full code DVT prophylaxis: SCDs Family Communication: discussed with wife at the bedside Disposition Plan: home  Kathie Dike, MD  [facility]  Pager [phone] If 7PM-7AM, please contact night-coverage at [email or web address], password [facility] 04/13/2014, 11:57 AM  LOS: 7 days   Consultants:  pulmonology  Procedures:  Echo: - Left ventricle: Mild to moderate left ventricular  enlargement. Wall thickness was increased in a pattern of mild LVH. Systolic function was normal. The estimated ejection fraction was in the range of 55% to 60%. Left ventricular diastolic function parameters were normal. - Tricuspid valve: There was mild regurgitation. - Pulmonary arteries: Incomplete tricuspid regurgitant jet to accurately assess pulmonary pressures.  Antibiotics: Vancomycin 3/15 >> 3/21 Cefepime 3/15 >> 3/21 Zosyn 3/21>>  HPI/Subjective: Continues to have productive cough of clear colored sputum. Feels that breathing is worse since this morning. Cannot get comfortable in chair.  Objective: Filed Vitals:   04/12/14 2024 04/13/14 0051 04/13/14 0543 04/13/14 0742  BP: 154/67  167/89   Pulse: 89  82   Temp: 99.1 F (37.3 C)  98.2 F (36.8 C)   TempSrc: Oral  Axillary   Resp: 20  18   Height:      Weight:   104.781 kg (231 lb)   SpO2: 96% 90% 94% 93%    Intake/Output Summary (Last 24 hours) at 04/13/14 1157 Last data filed at 04/13/14 0545  Gross per 24 hour  Intake    240 ml  Output   3600 ml  Net  -3360 ml     Filed Weights   04/12/14 1125 04/12/14 1545 04/13/14 0543  Weight: 106.9 kg (235 lb 10.8 oz) 103.4 kg (227 lb 15.3 oz) 104.781 kg (231 lb)    Exam:    General: Appears calm. comfortable Cardiovascular: RRR, no m/r/g. No LE edema. Respiratory: lungs are mostly clear bilaterally with only minimal scattered rhonchi, respiratory effort appears normal Abdomen soft, nontender nondistended.  Psychiatric: grossly normal mood and affect, speech fluent and appropriate Neurologic: grossly non-focal.   Scheduled Meds: . amLODipine  10 mg Oral Daily  . aspirin EC  81 mg Oral Daily  . carvedilol  25 mg Oral BID  . doxazosin  2 mg Oral Q12H  .  epoetin (EPOGEN/PROCRIT) injection  10,000 Units Subcutaneous Q M,W,F-HD  . guaiFENesin  1,200 mg Oral BID  . insulin aspart  0-5 Units Subcutaneous QHS  . insulin aspart  0-9 Units Subcutaneous  TID WC  . insulin aspart  3 Units Subcutaneous TID WC  . ipratropium-albuterol  3 mL Nebulization Q6H  . lanthanum  1,500 mg Oral TID WC  . [START ON 04/15/2014] levofloxacin (LEVAQUIN) IV  500 mg Intravenous Q48H  . methylPREDNISolone (SOLU-MEDROL) injection  40 mg Intravenous Q24H  . piperacillin-tazobactam (ZOSYN)  IV  2.25 g Intravenous Q8H  . polyethylene glycol  17 g Oral BID  . pravastatin  40 mg Oral QPM  . sodium chloride  3 mL Intravenous Q12H   Continuous Infusions:   Principal Problem:   Acute respiratory failure with hypoxia Active Problems:   OSA on CPAP   HTN (hypertension)   DM (diabetes mellitus), type 2 with renal complications   Anemia in chronic renal disease   History of kidney transplant   End-stage renal disease on hemodialysis   HCAP (healthcare-associated pneumonia)   Thrombocytopenia   Time spent 40 minutes

## 2014-04-13 NOTE — Care Management Utilization Note (Signed)
UR completed 

## 2014-04-13 NOTE — Progress Notes (Signed)
[place] for Zosyn and Levaquin Indication: pneumonia  Allergies  Allergen Reactions  . Codeine Nausea And Vomiting and Nausea Only   Patient Measurements: Height: 5\' 7"  (170.2 cm) Weight: 231 lb (104.781 kg) IBW/kg (Calculated) : 66.1  Vital Signs: Temp: 98.2 F (36.8 C) (03/22 0543) Temp Source: Axillary (03/22 0543) BP: 167/89 mmHg (03/22 0543) Pulse Rate: 82 (03/22 0543) Intake/Output from previous day: 03/21 0701 - 03/22 0700 In: 240 [P.O.:240] Out: 3600 [Urine:400] Intake/Output from this shift:    Labs:  Recent Labs  04/11/14 0643 04/12/14 0542 04/13/14 0545  WBC 3.6* 3.4*  --   HGB 9.3* 9.1*  --   PLT 87* 99*  --   CREATININE 11.86* 14.61* 9.89*   Estimated Creatinine Clearance: 10.1 mL/min (by C-G formula based on Cr of 9.89).  Recent Labs  04/12/14 0542  VANCORANDOM 20.3    Microbiology: Recent Results (from the past 720 hour(s))  Culture, blood (routine x 2)     Status: None   Collection Time: 04/06/14  5:36 AM  Result Value Ref Range Status   Specimen Description BLOOD RIGHT HAND  Final   Special Requests BOTTLES DRAWN AEROBIC AND ANAEROBIC 12CC EACH  Final   Culture NO GROWTH 5 DAYS  Final   Report Status 04/11/2014 FINAL  Final  MRSA PCR Screening     Status: None   Collection Time: 04/06/14  6:23 AM  Result Value Ref Range Status   MRSA by PCR NEGATIVE NEGATIVE Final    Comment:        The GeneXpert MRSA Assay (FDA approved for NASAL specimens only), is one component of a comprehensive MRSA colonization surveillance program. It is not intended to diagnose MRSA infection nor to guide or monitor treatment for MRSA infections.   Culture, blood (routine x 2)     Status: None   Collection Time: 04/06/14  7:44 AM  Result Value Ref Range Status   Specimen Description BLOOD RIGHT HAND  Final   Special Requests   Final    BOTTLES DRAWN AEROBIC AND ANAEROBIC AEB=10CC ANA=8CC   Culture NO GROWTH 5 DAYS   Final   Report Status 04/11/2014 FINAL  Final  Culture, Urine     Status: None   Collection Time: 04/06/14 10:30 AM  Result Value Ref Range Status   Specimen Description URINE, CLEAN CATCH  Final   Special Requests NONE  Final   Colony Count   Final    50,000 COLONIES/ML Performed at [facility]    Culture   Final    Multiple bacterial morphotypes present, none predominant. Suggest appropriate recollection if clinically indicated. Performed at [facility]    Report Status 04/07/2014 FINAL  Final  Culture, sputum-assessment     Status: None   Collection Time: 04/11/14 11:46 AM  Result Value Ref Range Status   Specimen Description SPUTUM  Final   Special Requests anergy or immune suppression.  Final   Sputum evaluation   Final    MICROSCOPIC FINDINGS SUGGEST THAT THIS SPECIMEN IS NOT REPRESENTATIVE OF LOWER RESPIRATORY SECRETIONS. PLEASE RECOLLECT. Gram Stain Report Called to,Read Back By and Verified With: L.BULLINS AT [id number] ON 04/11/14 BY S.VANHOORNE    Report Status 04/11/2014 FINAL  Final    Anti-infectives    Start     Dose/Rate Route Frequency Ordered Stop   04/15/14 1000  levofloxacin (LEVAQUIN) IVPB 500 mg     500 mg 100 mL/hr over 60 Minutes Intravenous Every  48 hours 04/13/14 0955     04/13/14 1200  piperacillin-tazobactam (ZOSYN) 2.25 g in dextrose 5 % 50 mL IVPB     2.25 g 100 mL/hr over 30 Minutes Intravenous Every 8 hours 04/13/14 0813     04/13/14 1100  levofloxacin (LEVAQUIN) IVPB 750 mg  Status:  Discontinued     750 mg 100 mL/hr over 90 Minutes Intravenous Every 24 hours 04/13/14 0955 04/13/14 0956   04/13/14 1100  levofloxacin (LEVAQUIN) IVPB 750 mg     750 mg 100 mL/hr over 90 Minutes Intravenous  Once 04/13/14 0956     04/12/14 2000  piperacillin-tazobactam (ZOSYN) IVPB 2.25 g  Status:  Discontinued     2.25 g 100 mL/hr over 30 Minutes Intravenous Every 8 hours 04/12/14 1835 04/13/14 0812   04/07/14 1200  ceFEPIme (MAXIPIME) 2 g in  dextrose 5 % 50 mL IVPB  Status:  Discontinued     2 g 100 mL/hr over 30 Minutes Intravenous Every M-W-F (Hemodialysis) 04/07/14 0828 04/12/14 1800   04/07/14 1200  vancomycin (VANCOCIN) IVPB 1000 mg/200 mL premix  Status:  Discontinued     1,000 mg 200 mL/hr over 60 Minutes Intravenous Every M-W-F (Hemodialysis) 04/07/14 0828 04/12/14 1800   04/06/14 2000  vancomycin (VANCOCIN) 1,500 mg in sodium chloride 0.9 % 500 mL IVPB     1,500 mg 250 mL/hr over 120 Minutes Intravenous  Once 04/06/14 1823 04/06/14 2316   04/06/14 1930  ceFEPIme (MAXIPIME) 2 g in dextrose 5 % 50 mL IVPB     2 g 100 mL/hr over 30 Minutes Intravenous  Once 04/06/14 1823 04/06/14 2142   04/06/14 1800  cefTRIAXone (ROCEPHIN) 1 g in dextrose 5 % 50 mL IVPB - Premix  Status:  Discontinued     1 g 100 mL/hr over 30 Minutes Intravenous Every 24 hours 04/06/14 1759 04/06/14 1804   04/06/14 1800  azithromycin (ZITHROMAX) 500 mg in dextrose 5 % 250 mL IVPB  Status:  Discontinued     500 mg 250 mL/hr over 60 Minutes Intravenous Every 24 hours 04/06/14 1759 04/06/14 1804     Assessment: 59 yoM with ESRD (MWF HD) was previously on Vancomycin & Cefepime for HCAP.   Now asked to switch to Zosyn He is afebrile with normal WBC.  Cx data NGTD. CXR + PNA.    Vancomycin 3/15>>3/21 Cefepime 3/15>>3/21 Zosyn 3/21 Levaquin 3/22 >>  Goal of Therapy:  Eradicate infection.   Plan:  Levaquin 750mg  IV today x 1 then 500mg  IV q48hrs Zosyn 2.25gm IV q8h F/U cx data and patient progress Duration of therapy per MD- consider change to PO abx once clinically appropriate  Hart Robinsons A 04/13/2014,11:13 AM

## 2014-04-13 NOTE — Progress Notes (Signed)
  Echocardiogram 2D Echocardiogram has been performed.  Kirk [name] 04/13/2014, 9:05 AM

## 2014-04-14 LAB — BASIC METABOLIC PANEL
Anion gap: 14 (ref 5–15)
BUN: 55 mg/dL — AB (ref 6–23)
CALCIUM: 9 mg/dL (ref 8.4–10.5)
CO2: 24 mmol/L (ref 19–32)
Chloride: 97 mmol/L (ref 96–112)
Creatinine, Ser: 12.31 mg/dL — ABNORMAL HIGH (ref 0.50–1.35)
GFR calc Af Amer: 5 mL/min — ABNORMAL LOW (ref >90)
GFR, EST NON AFRICAN AMERICAN: 4 mL/min — AB (ref >90)
Glucose, Bld: 167 mg/dL — ABNORMAL HIGH (ref 70–99)
Potassium: 5.5 mmol/L — ABNORMAL HIGH (ref 3.5–5.1)
Sodium: 135 mmol/L (ref 135–145)

## 2014-04-14 LAB — GLUCOSE, CAPILLARY
GLUCOSE-CAPILLARY: 161 mg/dL — AB (ref 70–99)
Glucose-Capillary: 189 mg/dL — ABNORMAL HIGH (ref 70–99)
Glucose-Capillary: 174 mg/dL — ABNORMAL HIGH (ref 70–99)

## 2014-04-14 LAB — CBC
HEMATOCRIT: 30.1 % — AB (ref 39.0–52.0)
HEMOGLOBIN: 10.3 g/dL — AB (ref 13.0–17.0)
MCH: 33.3 pg (ref 26.0–34.0)
MCHC: 34.2 g/dL (ref 30.0–36.0)
MCV: 97.4 fL (ref 78.0–100.0)
Platelets: 135 10*3/uL — ABNORMAL LOW (ref 150–400)
RBC: 3.09 MIL/uL — AB (ref 4.22–5.81)
RDW: 12.6 % (ref 11.5–15.5)
WBC: 6.4 10*3/uL (ref 4.0–10.5)

## 2014-04-14 MED ORDER — HEPARIN SODIUM (PORCINE) 1000 UNIT/ML IJ SOLN
INTRAMUSCULAR | Status: AC
Start: 2014-04-14 — End: 2014-04-14
  Administered 2014-04-14: 2100 [IU] via INTRAVENOUS_CENTRAL
  Filled 2014-04-14: qty 2

## 2014-04-14 MED ORDER — EPOETIN ALFA 10000 UNIT/ML IJ SOLN
INTRAMUSCULAR | Status: AC
  Administered 2014-04-14: 10000 [IU] via SUBCUTANEOUS
  Filled 2014-04-14: qty 1

## 2014-04-14 NOTE — Consult Note (Signed)
Kirk [name], MASSER               ACCOUNT NO.:  [id number]  MEDICAL RECORD NO.:  [id number]  LOCATION:  A306                          FACILITY:  [facility]  PHYSICIAN:  Zury Fazzino L. Luan Pulling, M.D.DATE OF BIRTH:  Jul 26, 1960  DATE OF CONSULTATION: DATE OF DISCHARGE:                                CONSULTATION   REASON FOR CONSULTATION:  Pneumonia.  HISTORY:  This is a 54 year old who says he has been doing pretty well and then developed increasing problems with shortness of breath and cough.  He has had some fever, some chills, and he has been producing sputum.  PAST MEDICAL HISTORY:  Positive for anxiety, chronic headaches, chronic kidney disease.  He has had a previous kidney transplant which failed. He has sleep apnea, on CPAP, which he says he does regularly.  Diabetes, non-alcoholic liver disease, history of seizures, memory loss, hypertension, BPH, chronic back pain, and he is on dialysis.  He has been treated with antibiotics, but still has shortness of breath.  CT scan shows what looks like some patchy infiltrates, suggestive of pneumonia.  His past medical history is positive for surgery on his inner ear, previous shunt placement, kidney transplant which has failed, cervical disk surgery.  MEDICATIONS: 1. Acetaminophen. 2. Combivent 2 puffs every 6 hours as needed. 3. Amlodipine 10 mg daily. 4. Aspirin 81 mg daily. 5. Nephro-Vite daily. 6. Carvedilol 25 mg b.i.d. 7. Vitamin D 5000 units daily. 8. Benadryl as needed. 9. Cardura 4 mg 1/2 tablet daily. 10.Mycolog cream as needed. 11.Zofran as needed. 12.Pravachol 40 mg daily. 13.Tramadol 50 mg 4 times a day as needed.  SOCIAL HISTORY:  He has about a 30 to 50 pack year smoking history.  He does not use any alcohol.  He does not use any illicit drugs.  FAMILY HISTORY:  Unknown because he is adopted.  REVIEW OF SYSTEMS:  Except as mentioned, he has not had any hemoptysis. He has been coughing.  He has had some chest  discomfort.  PHYSICAL EXAMINATION:  GENERAL:  Well-developed, well-nourished male, who is in mild distress. HEENT:  His pupils are reactive.  Nose and throat are clear.  Mucous membranes are moist. NECK:  Supple. CHEST:  Some rhonchi bilaterally. HEART:  Regular without gallop. ABDOMEN:  Soft.  No masses are felt. EXTREMITIES:  No edema. NEUROLOGIC:  Grossly intact.  ASSESSMENT:  I think he has pneumonia which is atypical.  I think we need to go ahead and treat him with Levaquin in addition to the other antibiotics.  I would continue other medications.  No changes in treatments at this time.  He will have a flutter valve.  He will have Mucinex.     [name] L. Luan Pulling, M.D.     ELH/MEDQ  D:  04/13/2014  T:  04/14/2014  Job:  [id number]

## 2014-04-14 NOTE — Progress Notes (Signed)
Subjective: He had more trouble breathing yesterday afternoon but this morning says he feels significantly better. His major complaint now is that he still has a cough and he is sore in his chest and abdomen from coughing  Objective: Vital signs in last 24 hours: Temp:  [98 F (36.7 C)-98.6 F (37 C)] 98 F (36.7 C) (03/23 0429) Pulse Rate:  [78-87] 87 (03/23 0429) Resp:  [18-20] 18 (03/23 0429) BP: (148-159)/(63-75) 157/70 mmHg (03/23 0429) SpO2:  [91 %-98 %] 98 % (03/23 0737) FiO2 (%):  [55 %] 55 % (03/23 0737) Weight:  [105.597 kg (232 lb 12.8 oz)] 105.597 kg (232 lb 12.8 oz) (03/23 0429) Weight change: -1.303 kg (-2 lb 14 oz) Last BM Date: 04/12/14  Intake/Output from previous day: 03/22 0701 - 03/23 0700 In: -  Out: 300 [Urine:300]  PHYSICAL EXAM General appearance: alert, cooperative and mild distress Resp: His chest is much clearer than yesterday Cardio: regular rate and rhythm, S1, S2 normal, no murmur, click, rub or gallop GI: soft, non-tender; bowel sounds normal; no masses,  no organomegaly Extremities: extremities normal, atraumatic, no cyanosis or edema  Lab Results:  Results for orders placed or performed during the hospital encounter of 04/06/14 (from the past 48 hour(s))  Glucose, capillary     Status: Abnormal   Collection Time: 04/12/14 11:10 AM  Result Value Ref Range   Glucose-Capillary 135 (H) 70 - 99 mg/dL  Glucose, capillary     Status: Abnormal   Collection Time: 04/12/14  4:51 PM  Result Value Ref Range   Glucose-Capillary 134 (H) 70 - 99 mg/dL   Comment 1 Notify RN    Comment 2 Document in Chart   Glucose, capillary     Status: Abnormal   Collection Time: 04/12/14  8:28 PM  Result Value Ref Range   Glucose-Capillary 157 (H) 70 - 99 mg/dL   Comment 1 Notify RN    Comment 2 Document in Chart   Basic metabolic panel     Status: Abnormal   Collection Time: 04/13/14  5:45 AM  Result Value Ref Range   Sodium 140 135 - 145 mmol/L   Potassium 4.4  3.5 - 5.1 mmol/L   Chloride 101 96 - 112 mmol/L   CO2 28 19 - 32 mmol/L   Glucose, Bld 116 (H) 70 - 99 mg/dL   BUN 39 (H) 6 - 23 mg/dL    Comment: DELTA CHECK NOTED   Creatinine, Ser 9.89 (H) 0.50 - 1.35 mg/dL   Calcium 8.8 8.4 - 10.5 mg/dL   GFR calc non Af Amer 5 (L) >90 mL/min   GFR calc Af Amer 6 (L) >90 mL/min    Comment: (NOTE) The eGFR has been calculated using the CKD EPI equation. This calculation has not been validated in all clinical situations. eGFR's persistently <90 mL/min signify possible Chronic Kidney Disease.    Anion gap 11 5 - 15  Phosphorus     Status: None   Collection Time: 04/13/14  5:45 AM  Result Value Ref Range   Phosphorus 4.1 2.3 - 4.6 mg/dL  Glucose, capillary     Status: Abnormal   Collection Time: 04/13/14  8:05 AM  Result Value Ref Range   Glucose-Capillary 134 (H) 70 - 99 mg/dL  Glucose, capillary     Status: Abnormal   Collection Time: 04/13/14 11:40 AM  Result Value Ref Range   Glucose-Capillary 137 (H) 70 - 99 mg/dL  Blood gas, arterial     Status: Abnormal  Collection Time: 04/13/14  4:15 PM  Result Value Ref Range   FIO2 55.00 %   Delivery systems VENTURI MASK    pH, Arterial 7.462 (H) 7.350 - 7.450   pCO2 arterial 38.4 35.0 - 45.0 mmHg   pO2, Arterial 59.4 (L) 80.0 - 100.0 mmHg   Bicarbonate 27.0 (H) 20.0 - 24.0 mEq/L   TCO2 22.1 0 - 100 mmol/L   Acid-Base Excess 3.4 (H) 0.0 - 2.0 mmol/L   O2 Saturation 89.5 %   Patient temperature 37.0    Collection site RIGHT RADIAL    Drawn by [phone]    Sample type ARTERIAL    Allens test (pass/fail) PASS PASS  Glucose, capillary     Status: Abnormal   Collection Time: 04/13/14  4:20 PM  Result Value Ref Range   Glucose-Capillary 170 (H) 70 - 99 mg/dL   Comment 1 Notify RN   Glucose, capillary     Status: Abnormal   Collection Time: 04/13/14  9:09 PM  Result Value Ref Range   Glucose-Capillary 205 (H) 70 - 99 mg/dL   Comment 1 Notify RN   CBC     Status: Abnormal   Collection Time:  04/14/14  5:40 AM  Result Value Ref Range   WBC 6.4 4.0 - 10.5 K/uL   RBC 3.09 (L) 4.22 - 5.81 MIL/uL   Hemoglobin 10.3 (L) 13.0 - 17.0 g/dL   HCT 14.3 (L) 88.8 - 75.7 %   MCV 97.4 78.0 - 100.0 fL   MCH 33.3 26.0 - 34.0 pg   MCHC 34.2 30.0 - 36.0 g/dL   RDW 97.2 82.0 - 60.1 %   Platelets 135 (L) 150 - 400 K/uL    Comment: DELTA CHECK NOTED  Basic metabolic panel     Status: Abnormal   Collection Time: 04/14/14  5:40 AM  Result Value Ref Range   Sodium 135 135 - 145 mmol/L   Potassium 5.5 (H) 3.5 - 5.1 mmol/L    Comment: DELTA CHECK NOTED   Chloride 97 96 - 112 mmol/L   CO2 24 19 - 32 mmol/L   Glucose, Bld 167 (H) 70 - 99 mg/dL   BUN 55 (H) 6 - 23 mg/dL   Creatinine, Ser 56.15 (H) 0.50 - 1.35 mg/dL   Calcium 9.0 8.4 - 37.9 mg/dL   GFR calc non Af Amer 4 (L) >90 mL/min   GFR calc Af Amer 5 (L) >90 mL/min    Comment: (NOTE) The eGFR has been calculated using the CKD EPI equation. This calculation has not been validated in all clinical situations. eGFR's persistently <90 mL/min signify possible Chronic Kidney Disease.    Anion gap 14 5 - 15  Glucose, capillary     Status: Abnormal   Collection Time: 04/14/14  8:06 AM  Result Value Ref Range   Glucose-Capillary 189 (H) 70 - 99 mg/dL    ABGS  Recent Labs  43/27/61 1615  PHART 7.462*  PO2ART 59.4*  TCO2 22.1  HCO3 27.0*   CULTURES Recent Results (from the past 240 hour(s))  Culture, blood (routine x 2)     Status: None   Collection Time: 04/06/14  5:36 AM  Result Value Ref Range Status   Specimen Description BLOOD RIGHT HAND  Final   Special Requests BOTTLES DRAWN AEROBIC AND ANAEROBIC 12CC EACH  Final   Culture NO GROWTH 5 DAYS  Final   Report Status 04/11/2014 FINAL  Final  MRSA PCR Screening     Status: None  Collection Time: 04/06/14  6:23 AM  Result Value Ref Range Status   MRSA by PCR NEGATIVE NEGATIVE Final    Comment:        The GeneXpert MRSA Assay (FDA approved for NASAL specimens only), is one  component of a comprehensive MRSA colonization surveillance program. It is not intended to diagnose MRSA infection nor to guide or monitor treatment for MRSA infections.   Culture, blood (routine x 2)     Status: None   Collection Time: 04/06/14  7:44 AM  Result Value Ref Range Status   Specimen Description BLOOD RIGHT HAND  Final   Special Requests   Final    BOTTLES DRAWN AEROBIC AND ANAEROBIC AEB=10CC ANA=8CC   Culture NO GROWTH 5 DAYS  Final   Report Status 04/11/2014 FINAL  Final  Culture, Urine     Status: None   Collection Time: 04/06/14 10:30 AM  Result Value Ref Range Status   Specimen Description URINE, CLEAN CATCH  Final   Special Requests NONE  Final   Colony Count   Final    50,000 COLONIES/ML Performed at [facility]    Culture   Final    Multiple bacterial morphotypes present, none predominant. Suggest appropriate recollection if clinically indicated. Performed at [facility]    Report Status 04/07/2014 FINAL  Final  Culture, sputum-assessment     Status: None   Collection Time: 04/11/14 11:46 AM  Result Value Ref Range Status   Specimen Description SPUTUM  Final   Special Requests anergy or immune suppression.  Final   Sputum evaluation   Final    MICROSCOPIC FINDINGS SUGGEST THAT THIS SPECIMEN IS NOT REPRESENTATIVE OF LOWER RESPIRATORY SECRETIONS. PLEASE RECOLLECT. Gram Stain Report Called to,Read Back By and Verified With: L.BULLINS AT 1610 ON 04/11/14 BY S.VANHOORNE    Report Status 04/11/2014 FINAL  Final   Studies/Results: Ct Chest Wo Contrast  04/12/2014   CLINICAL DATA:  Chest pain earlier today, shortness of breath, coughing, hypoxia, question pneumonia, diabetes, dialysis for 5 years, hypertension  EXAM: CT CHEST WITHOUT CONTRAST  TECHNIQUE: Multidetector CT imaging of the chest was performed following the standard protocol without IV contrast. Sagittal and coronal MPR images reconstructed from axial data set.  COMPARISON:   04/06/2014  FINDINGS: Contracted gallbladder containing calculi.  Remaining visualized portion of upper abdomen unremarkable.  Minimal atherosclerotic calcification aorta.  No thoracic adenopathy.  Scattered BILATERAL patchy pulmonary infiltrates in both lungs consistent with pneumonia.  Decreased atelectasis and infiltrates in both lower lobes.  No pleural effusion or pneumothorax.  Mild central peribronchial thickening.  No discrete pulmonary mass identified.  Osseous structures unremarkable.  IMPRESSION: Mild patchy BILATERAL pulmonary infiltrates consistent with pneumonia, overall demonstrating a few new sites of patchy involvement in the upper lobes and improvement in aeration at the lower lobes.   Electronically Signed   By: Lavonia Dana M.D.   On: 04/12/2014 17:40   Dg Chest Port 1 View  04/13/2014   CLINICAL DATA:  Increasing shortness of Breath  EXAM: PORTABLE CHEST - 1 VIEW  COMPARISON:  04/06/2014  FINDINGS: Cardiac shadow is within normal limits. The lungs are well aerated bilaterally. No focal infiltrate or sizable effusion is seen. No bony abnormality is noted. Postsurgical changes are again seen in the cervical spine.  IMPRESSION: No acute abnormality noted.   Electronically Signed   By: Inez Catalina M.D.   On: 04/13/2014 15:34    Medications:  Prior to Admission:  Prescriptions prior to  admission  Medication Sig Dispense Refill Last Dose  . acetaminophen (TYLENOL) 500 MG tablet Take 500 mg by mouth every 4 (four) hours as needed for mild pain.    unknown  . albuterol-ipratropium (COMBIVENT) 18-103 MCG/ACT inhaler Inhale 2 puffs into the lungs every 6 (six) hours as needed for wheezing. 14.7 g 3 04/05/2014 at Unknown time  . amLODipine (NORVASC) 10 MG tablet Take 1 tablet (10 mg total) by mouth daily. 90 tablet 1 04/05/2014 at Unknown time  . aspirin EC 81 MG tablet Take 81 mg by mouth daily.   04/05/2014 at Unknown time  . b complex-vitamin c-folic acid (NEPHRO-VITE) 0.8 MG TABS tablet TAKE  1 TABLET BY MOUTH EVERY DAY 30 tablet 5 04/05/2014 at Unknown time  . carvedilol (COREG) 25 MG tablet Take 1 tablet (25 mg total) by mouth 2 (two) times daily. Take one tablet in the morning and one tablet in the evening 180 tablet 1 04/05/2014 at 2300  . Cholecalciferol 5000 UNITS TABS Take 5,000 Units by mouth every morning.   04/05/2014 at Unknown time  . diphenhydrAMINE (BENADRYL) 25 MG tablet Take 50 mg by mouth 2 (two) times daily.    04/05/2014 at Unknown time  . doxazosin (CARDURA) 4 MG tablet Take 0.5 tablet  by mouth in the am and 0.5 tablet by mouth in the pm to regulate blood pressure. (Patient taking differently: Take 2 mg by mouth 2 (two) times daily. Take 0.5 tablet  by mouth in the am and 0.5 tablet by mouth in the pm to regulate blood pressure.) 90 tablet 1 04/05/2014 at Unknown time  . lanthanum (FOSRENOL) 1000 MG chewable tablet Chew 500-1,000 mg by mouth 4 (four) times daily. 1 tablet three times daily with each meal and 1/2 tablet with snack.   04/05/2014 at Unknown time  . nystatin-triamcinolone (MYCOLOG II) cream Apply twice daily to rash on abdomen (Patient taking differently: Apply 1 application topically 2 (two) times daily. Apply twice daily to rash on abdomen) 30 g 0 unknown  . ondansetron (ZOFRAN ODT) 4 MG disintegrating tablet Take 1 tablet (4 mg total) by mouth every 8 (eight) hours as needed for nausea or vomiting. 20 tablet 0 unknown  . pravastatin (PRAVACHOL) 40 MG tablet Take 1 tablet (40 mg total) by mouth every evening. 90 tablet 1 04/05/2014 at Unknown time  . traMADol (ULTRAM) 50 MG tablet One up to 4 times daly if needed for pain (Patient taking differently: Take 50 mg by mouth 4 (four) times daily as needed for moderate pain. One up to 4 times daly if needed for pain) 100 tablet 3 unknown  . traMADol (ULTRAM-ER) 100 MG 24 hr tablet Take 100 mg by mouth 4 (four) times daily as needed for pain.   Past Month at Unknown time    Assesment: He was admitted with healthcare  associated pneumonia and acute respiratory failure with hypoxia. He had more trouble yesterday but this morning says he feels significantly better. He is still coughing mostly nonproductively He has obstructive sleep apnea on CPAP which seems to be stable  He has end-stage renal disease on hemodialysis and is scheduled for dialysis  Principal Problem:   Acute respiratory failure with hypoxia Active Problems:   OSA on CPAP   HTN (hypertension)   DM (diabetes mellitus), type 2 with renal complications   Anemia in chronic renal disease   History of kidney transplant   End-stage renal disease on hemodialysis   HCAP (healthcare-associated pneumonia)   Thrombocytopenia  Plan:No change in treatment   LOS: 8 days   Kirk [name] L 04/14/2014, 8:55 AM

## 2014-04-14 NOTE — Progress Notes (Signed)
PROGRESS NOTE  Kirk [name] [id number] DOB: 04/30/1960 DOA: 04/06/2014 PCP: Estill Dooms, MD  Summary: 54 year old man presented with shortness of breath, admitted for acute hypoxic respiratory failure, subsequently diagnosed with healthcare associated pneumonia.  Assessment/Plan: 1. Acute hypoxic respiratory failure secondary to pneumonia. Repeat CT with mixed findings with improvement in the lower lobes and some new infiltrates in the upper lobes. Patient had worsening shortness of breath on 3/22 requiring a Ventimask. He was started on intravenous steroids. ABG indicated hypoxia. He was continued on bronchodilators. He feels that his breathing is improving. Continue to wean down oxygen as tolerated.  2. Healthcare associated pneumonia, afebrile, but still coughing and CT with evidence of some new infiltrates despite more than 7 days of antibiotics. Strep and legionella urinary antigens negative. Influenza PCR negative. Antibiotics changed from cefepime to zosyn and levaquin. Appreciate pulmonology input. 3. Nausea without vomiting. Resolved. AXR non-acute 4. Diabetes mellitus type 2 with diabetic nephropathy. Remains stable. 5. Hypertension. Remains stable. 6. Anemia of chronic disease. Remains stable. 7. Leukopenia, stable. Suspect secondary to pneumonia. HIV screen negative. 8. Chronic thrombocytopenia. Improving.Follow-up as an outpatient. 9. End-stage renal disease. Per nephrology. 10. Obstructive sleep apnea, CPAP.  Code Status: full code DVT prophylaxis: SCDs Family Communication: discussed with wife at the bedside Disposition Plan: home  Kathie Dike, MD  [facility]  Pager [phone] If 7PM-7AM, please contact night-coverage at [email or web address], password [facility] 04/14/2014, 6:32 PM  LOS: 8 days   Consultants:  pulmonology  Procedures:  Echo: - Left ventricle: Mild to moderate left ventricular enlargement. Wall thickness was increased in a pattern of mild  LVH. Systolic function was normal. The estimated ejection fraction was in the range of 55% to 60%. Left ventricular diastolic function parameters were normal. - Tricuspid valve: There was mild regurgitation. - Pulmonary arteries: Incomplete tricuspid regurgitant jet to accurately assess pulmonary pressures.  Antibiotics: Vancomycin 3/15 >> 3/21 Cefepime 3/15 >> 3/21 Zosyn 3/21>>  HPI/Subjective: Feeling better today. Coughing less. Shortness of breath appears to be improving   Objective: Filed Vitals:   04/14/14 1615 04/14/14 1630 04/14/14 1700 04/14/14 1730  BP: 180/85 162/86 160/85 164/85  Pulse: 89 96 94 95  Temp:      TempSrc:      Resp:      Height:      Weight:      SpO2:  93%      Intake/Output Summary (Last 24 hours) at 04/14/14 1832 Last data filed at 04/14/14 1330  Gross per 24 hour  Intake    410 ml  Output    500 ml  Net    -90 ml     Filed Weights   04/13/14 0543 04/14/14 0429 04/14/14 1545  Weight: 104.781 kg (231 lb) 105.597 kg (232 lb 12.8 oz) 106.1 kg (233 lb 14.5 oz)    Exam:    General: Appears calm. comfortable Cardiovascular: RRR, no m/r/g. No LE edema. Respiratory: lungs are mostly clear with mild crackles at bases, respiratory effort appears normal Abdomen soft, nontender nondistended.  Psychiatric: grossly normal mood and affect, speech fluent and appropriate Neurologic: grossly non-focal.   Scheduled Meds: . amLODipine  10 mg Oral Daily  . aspirin EC  81 mg Oral Daily  . carvedilol  25 mg Oral BID  . doxazosin  2 mg Oral Q12H  . epoetin (EPOGEN/PROCRIT) injection  10,000 Units Subcutaneous Q M,W,F-HD  . guaiFENesin  1,200 mg Oral BID  . insulin aspart  0-5 Units Subcutaneous QHS  .  insulin aspart  0-9 Units Subcutaneous TID WC  . insulin aspart  3 Units Subcutaneous TID WC  . ipratropium-albuterol  3 mL Nebulization Q4H  . lanthanum  1,500 mg Oral TID WC  . [START ON 04/15/2014] levofloxacin (LEVAQUIN) IV  500 mg  Intravenous Q48H  . methylPREDNISolone (SOLU-MEDROL) injection  60 mg Intravenous Q6H  . piperacillin-tazobactam (ZOSYN)  IV  2.25 g Intravenous Q8H  . polyethylene glycol  17 g Oral BID  . pravastatin  40 mg Oral QPM  . sodium chloride  3 mL Intravenous Q12H   Continuous Infusions:   Principal Problem:   Acute respiratory failure with hypoxia Active Problems:   OSA on CPAP   HTN (hypertension)   DM (diabetes mellitus), type 2 with renal complications   Anemia in chronic renal disease   History of kidney transplant   End-stage renal disease on hemodialysis   HCAP (healthcare-associated pneumonia)   Thrombocytopenia   Time spent 30 minutes

## 2014-04-14 NOTE — Procedures (Signed)
   HEMODIALYSIS TREATMENT NOTE:  4 hour dialysis completed via left forearm AVF (15g ante/retrograde buttonhole).  Goal met:  3.6 liters removed.  No interruption in ultrafiltration.  All blood reinfused.  Hemostasis achieved in 5 minutes.  Report called to Shari Prows., RN.  Rockwell Alexandria, RN, CDN

## 2014-04-14 NOTE — Progress Notes (Signed)
Kirk [name]  MRN: [id number]  DOB/AGE: [birth year] 54 y.o.  Primary Care Physician:GREEN, [name], MD  Admit date: 04/06/2014  Chief Complaint:  Chief Complaint  Patient presents with  . Shortness of Breath    [name]-Pt presented on  04/06/2014 with  Chief Complaint  Patient presents with  . Shortness of Breath  .    Pt today feels better .  Meds . amLODipine  10 mg Oral Daily  . aspirin EC  81 mg Oral Daily  . carvedilol  25 mg Oral BID  . doxazosin  2 mg Oral Q12H  . epoetin (EPOGEN/PROCRIT) injection  10,000 Units Subcutaneous Q M,W,F-HD  . guaiFENesin  1,200 mg Oral BID  . insulin aspart  0-5 Units Subcutaneous QHS  . insulin aspart  0-9 Units Subcutaneous TID WC  . insulin aspart  3 Units Subcutaneous TID WC  . ipratropium-albuterol  3 mL Nebulization Q4H  . lanthanum  1,500 mg Oral TID WC  . [START ON 04/15/2014] levofloxacin (LEVAQUIN) IV  500 mg Intravenous Q48H  . methylPREDNISolone (SOLU-MEDROL) injection  60 mg Intravenous Q6H  . piperacillin-tazobactam (ZOSYN)  IV  2.25 g Intravenous Q8H  . polyethylene glycol  17 g Oral BID  . pravastatin  40 mg Oral QPM  . sodium chloride  3 mL Intravenous Q12H      Physical Exam: Vital signs in last 24 hours: Temp:  [98 F (36.7 C)-98.6 F (37 C)] 98 F (36.7 C) (03/23 0429) Pulse Rate:  [78-87] 87 (03/23 0429) Resp:  [18-20] 18 (03/23 0429) BP: (148-159)/(63-75) 157/70 mmHg (03/23 0429) SpO2:  [91 %-98 %] 98 % (03/23 0737) FiO2 (%):  [55 %] 55 % (03/23 0737) Weight:  [232 lb 12.8 oz (105.597 kg)] 232 lb 12.8 oz (105.597 kg) (03/23 0429) Weight change: -2 lb 14 oz (-1.303 kg) Last BM Date: 04/12/14  Intake/Output from previous day: 03/22 0701 - 03/23 0700 In: -  Out: 300 [Urine:300]     Physical Exam: General- pt is awake,alert, oriented to time place and person Resp- No REsp distress, Rhonchi+( lesser than before) CVS- S1S2 regular in rate and rhythm GIT- BS+, soft, NT, ND, surgical scar for transplant  present EXT- NO LE Edema, Cyanosis Access-AVF+   Lab Results: CBC  Recent Labs  04/12/14 0542 04/14/14 0540  WBC 3.4* 6.4  HGB 9.1* 10.3*  HCT 27.0* 30.1*  PLT 99* 135*    BMET  Recent Labs  04/13/14 0545 04/14/14 0540  NA 140 135  K 4.4 5.5*  CL 101 97  CO2 28 24  GLUCOSE 116* 167*  BUN 39* 55*  CREATININE 9.89* 12.31*  CALCIUM 8.8 9.0    MICRO Recent Results (from the past 240 hour([name]))  Culture, blood (routine x 2)     Status: None   Collection Time: 04/06/14  5:36 AM  Result Value Ref Range Status   Specimen Description BLOOD RIGHT HAND  Final   Special Requests BOTTLES DRAWN AEROBIC AND ANAEROBIC 12CC EACH  Final   Culture NO GROWTH 5 DAYS  Final   Report Status 04/11/2014 FINAL  Final  MRSA PCR Screening     Status: None   Collection Time: 04/06/14  6:23 AM  Result Value Ref Range Status   MRSA by PCR NEGATIVE NEGATIVE Final    Comment:        The GeneXpert MRSA Assay (FDA approved for NASAL specimens only), is one component of a comprehensive MRSA colonization surveillance program. It is not intended  to diagnose MRSA infection nor to guide or monitor treatment for MRSA infections.   Culture, blood (routine x 2)     Status: None   Collection Time: 04/06/14  7:44 AM  Result Value Ref Range Status   Specimen Description BLOOD RIGHT HAND  Final   Special Requests   Final    BOTTLES DRAWN AEROBIC AND ANAEROBIC AEB=10CC ANA=8CC   Culture NO GROWTH 5 DAYS  Final   Report Status 04/11/2014 FINAL  Final  Culture, Urine     Status: None   Collection Time: 04/06/14 10:30 AM  Result Value Ref Range Status   Specimen Description URINE, CLEAN CATCH  Final   Special Requests NONE  Final   Colony Count   Final    50,000 COLONIES/ML Performed at [facility]    Culture   Final    Multiple bacterial morphotypes present, none predominant. Suggest appropriate recollection if clinically indicated. Performed at [facility]    Report  Status 04/07/2014 FINAL  Final  Culture, sputum-assessment     Status: None   Collection Time: 04/11/14 11:46 AM  Result Value Ref Range Status   Specimen Description SPUTUM  Final   Special Requests anergy or immune suppression.  Final   Sputum evaluation   Final    MICROSCOPIC FINDINGS SUGGEST THAT THIS SPECIMEN IS NOT REPRESENTATIVE OF LOWER RESPIRATORY SECRETIONS. PLEASE RECOLLECT. Gram Stain Report Called to,Read Back By and Verified With: L.BULLINS AT [id number] ON 04/11/14 BY [name].VANHOORNE    Report Status 04/11/2014 FINAL  Final      Lab Results  Component Value Date   PTH 93.1* 01/19/2010   CALCIUM 9.0 04/14/2014   CAION 1.02* 09/08/2013   PHOS 4.1 04/13/2014               Impression: 1)Renal ESRD on Hd  On MWF schedule will dialyze today   2)HTN  Medication- On Calcium Channel Blockers On Alpha and beta Blockers   3)Anemia HGb just at goal (9--11) On Epo during HD  4)CKD Mineral-Bone Disorder Secondary Hyperparathyroidism present  Phosphorus at goal.   5)ID- admitted with Pneumonia Primary MD following  6)Electrolytes Hyperkalemic NOrmonatremic   7)Acid base Co2 at goal  Plan:   Will dialyze today Will use 2 k bath   Kirk [name] 04/14/2014, 9:18 AM

## 2014-04-15 LAB — BASIC METABOLIC PANEL
Anion gap: 13 (ref 5–15)
BUN: 43 mg/dL — AB (ref 6–23)
CO2: 26 mmol/L (ref 19–32)
CREATININE: 8.43 mg/dL — AB (ref 0.50–1.35)
Calcium: 9 mg/dL (ref 8.4–10.5)
Chloride: 97 mmol/L (ref 96–112)
GFR calc Af Amer: 7 mL/min — ABNORMAL LOW (ref >90)
GFR, EST NON AFRICAN AMERICAN: 6 mL/min — AB (ref >90)
Glucose, Bld: 233 mg/dL — ABNORMAL HIGH (ref 70–99)
Potassium: 4.6 mmol/L (ref 3.5–5.1)
Sodium: 136 mmol/L (ref 135–145)

## 2014-04-15 LAB — GLUCOSE, CAPILLARY
GLUCOSE-CAPILLARY: 249 mg/dL — AB (ref 70–99)
GLUCOSE-CAPILLARY: 253 mg/dL — AB (ref 70–99)
Glucose-Capillary: 231 mg/dL — ABNORMAL HIGH (ref 70–99)
Glucose-Capillary: 241 mg/dL — ABNORMAL HIGH (ref 70–99)

## 2014-04-15 MED ORDER — LEVOFLOXACIN 500 MG PO TABS: 500.0000 mg | ORAL_TABLET | ORAL | Status: DC

## 2014-04-15 MED ORDER — PREDNISONE 20 MG PO TABS
40.0000 mg | ORAL_TABLET | Freq: Every day | ORAL | Status: DC
  Administered 2014-04-16: 40 mg via ORAL
  Filled 2014-04-15: qty 2

## 2014-04-15 NOTE — Progress Notes (Signed)
PHARMACIST - PHYSICIAN COMMUNICATION DR:   Roderic Palau CONCERNING: Antibiotic IV to Oral Route Change Policy  RECOMMENDATION: This patient is receiving Levaquin by the intravenous route.  Based on criteria approved by the Pharmacy and Therapeutics Committee, the antibiotic(s) is/are being converted to the equivalent oral dose form(s).   DESCRIPTION: These criteria include:  Patient being treated for a respiratory tract infection, urinary tract infection, cellulitis or clostridium difficile associated diarrhea if on metronidazole  The patient is not neutropenic and does not exhibit a GI malabsorption state  The patient is eating (either orally or via tube) and/or has been taking other orally administered medications for a least 24 hours  The patient is improving clinically and has a Tmax < 100.5  *Next dose due 04/17/14  If you have questions about this conversion, please contact the Pharmacy Department  [x]   [phone] )  [facility] []   [phone] )  [facility]  []   [phone] )  [facility] []   [phone] )  [place], PharmD, BCPS 04/15/2014@10 :46 AM

## 2014-04-15 NOTE — Progress Notes (Signed)
Subjective: He says he feels better. Less cough less congestion and much less shortness of breath. He says he did have a diagnosis of COPD made by his primary care physician but he did not feel that the medications which were inhalers helped him very much  Objective: Vital signs in last 24 hours: Temp:  [98.2 F (36.8 C)-98.6 F (37 C)] 98.3 F (36.8 C) (03/24 0427) Pulse Rate:  [82-103] 85 (03/24 0427) Resp:  [20-22] 20 (03/24 0427) BP: (145-180)/(66-89) 156/81 mmHg (03/24 0427) SpO2:  [90 %-97 %] 95 % (03/24 0701) Weight:  [102.7 kg (226 lb 6.6 oz)-106.1 kg (233 lb 14.5 oz)] 102.7 kg (226 lb 6.6 oz) (03/23 2007) Weight change: 0.503 kg (1 lb 1.7 oz) Last BM Date: 04/12/14  Intake/Output from previous day: 03/23 0701 - 03/24 0700 In: 410 [P.O.:360; IV Piggyback:50] Out: 3873 [Urine:200]  PHYSICAL EXAM General appearance: alert, cooperative and no distress Resp: clear to auscultation bilaterally Cardio: regular rate and rhythm, S1, S2 normal, no murmur, click, rub or gallop GI: soft, non-tender; bowel sounds normal; no masses,  no organomegaly Extremities: extremities normal, atraumatic, no cyanosis or edema  Lab Results:  Results for orders placed or performed during the hospital encounter of 04/06/14 (from the past 48 hour(s))  Glucose, capillary     Status: Abnormal   Collection Time: 04/13/14 11:40 AM  Result Value Ref Range   Glucose-Capillary 137 (H) 70 - 99 mg/dL  Blood gas, arterial     Status: Abnormal   Collection Time: 04/13/14  4:15 PM  Result Value Ref Range   FIO2 55.00 %   Delivery systems VENTURI MASK    pH, Arterial 7.462 (H) 7.350 - 7.450   pCO2 arterial 38.4 35.0 - 45.0 mmHg   pO2, Arterial 59.4 (L) 80.0 - 100.0 mmHg   Bicarbonate 27.0 (H) 20.0 - 24.0 mEq/L   TCO2 22.1 0 - 100 mmol/L   Acid-Base Excess 3.4 (H) 0.0 - 2.0 mmol/L   O2 Saturation 89.5 %   Patient temperature 37.0    Collection site RIGHT RADIAL    Drawn by [id number]    Sample type  ARTERIAL    Allens test (pass/fail) PASS PASS  Glucose, capillary     Status: Abnormal   Collection Time: 04/13/14  4:20 PM  Result Value Ref Range   Glucose-Capillary 170 (H) 70 - 99 mg/dL   Comment 1 Notify RN   Glucose, capillary     Status: Abnormal   Collection Time: 04/13/14  9:09 PM  Result Value Ref Range   Glucose-Capillary 205 (H) 70 - 99 mg/dL   Comment 1 Notify RN   CBC     Status: Abnormal   Collection Time: 04/14/14  5:40 AM  Result Value Ref Range   WBC 6.4 4.0 - 10.5 K/uL   RBC 3.09 (L) 4.22 - 5.81 MIL/uL   Hemoglobin 10.3 (L) 13.0 - 17.0 g/dL   HCT 30.1 (L) 39.0 - 52.0 %   MCV 97.4 78.0 - 100.0 fL   MCH 33.3 26.0 - 34.0 pg   MCHC 34.2 30.0 - 36.0 g/dL   RDW 12.6 11.5 - 15.5 %   Platelets 135 (L) 150 - 400 K/uL    Comment: DELTA CHECK NOTED  Basic metabolic panel     Status: Abnormal   Collection Time: 04/14/14  5:40 AM  Result Value Ref Range   Sodium 135 135 - 145 mmol/L   Potassium 5.5 (H) 3.5 - 5.1 mmol/L    Comment:  DELTA CHECK NOTED   Chloride 97 96 - 112 mmol/L   CO2 24 19 - 32 mmol/L   Glucose, Bld 167 (H) 70 - 99 mg/dL   BUN 55 (H) 6 - 23 mg/dL   Creatinine, Ser 12.31 (H) 0.50 - 1.35 mg/dL   Calcium 9.0 8.4 - 10.5 mg/dL   GFR calc non Af Amer 4 (L) >90 mL/min   GFR calc Af Amer 5 (L) >90 mL/min    Comment: (NOTE) The eGFR has been calculated using the CKD EPI equation. This calculation has not been validated in all clinical situations. eGFR's persistently <90 mL/min signify possible Chronic Kidney Disease.    Anion gap 14 5 - 15  Glucose, capillary     Status: Abnormal   Collection Time: 04/14/14  8:06 AM  Result Value Ref Range   Glucose-Capillary 189 (H) 70 - 99 mg/dL  Glucose, capillary     Status: Abnormal   Collection Time: 04/14/14 11:54 AM  Result Value Ref Range   Glucose-Capillary 174 (H) 70 - 99 mg/dL  Glucose, capillary     Status: Abnormal   Collection Time: 04/14/14  8:42 PM  Result Value Ref Range   Glucose-Capillary 161  (H) 70 - 99 mg/dL   Comment 1 Notify RN    Comment 2 Document in Chart   Basic metabolic panel     Status: Abnormal   Collection Time: 04/15/14  5:30 AM  Result Value Ref Range   Sodium 136 135 - 145 mmol/L   Potassium 4.6 3.5 - 5.1 mmol/L   Chloride 97 96 - 112 mmol/L   CO2 26 19 - 32 mmol/L   Glucose, Bld 233 (H) 70 - 99 mg/dL   BUN 43 (H) 6 - 23 mg/dL   Creatinine, Ser 8.43 (H) 0.50 - 1.35 mg/dL   Calcium 9.0 8.4 - 10.5 mg/dL   GFR calc non Af Amer 6 (L) >90 mL/min   GFR calc Af Amer 7 (L) >90 mL/min    Comment: (NOTE) The eGFR has been calculated using the CKD EPI equation. This calculation has not been validated in all clinical situations. eGFR's persistently <90 mL/min signify possible Chronic Kidney Disease.    Anion gap 13 5 - 15  Glucose, capillary     Status: Abnormal   Collection Time: 04/15/14  8:18 AM  Result Value Ref Range   Glucose-Capillary 249 (H) 70 - 99 mg/dL    ABGS  Recent Labs  04/13/14 1615  PHART 7.462*  PO2ART 59.4*  TCO2 22.1  HCO3 27.0*   CULTURES Recent Results (from the past 240 hour(s))  Culture, blood (routine x 2)     Status: None   Collection Time: 04/06/14  5:36 AM  Result Value Ref Range Status   Specimen Description BLOOD RIGHT HAND  Final   Special Requests BOTTLES DRAWN AEROBIC AND ANAEROBIC 12CC EACH  Final   Culture NO GROWTH 5 DAYS  Final   Report Status 04/11/2014 FINAL  Final  MRSA PCR Screening     Status: None   Collection Time: 04/06/14  6:23 AM  Result Value Ref Range Status   MRSA by PCR NEGATIVE NEGATIVE Final    Comment:        The GeneXpert MRSA Assay (FDA approved for NASAL specimens only), is one component of a comprehensive MRSA colonization surveillance program. It is not intended to diagnose MRSA infection nor to guide or monitor treatment for MRSA infections.   Culture, blood (routine x 2)  Status: None   Collection Time: 04/06/14  7:44 AM  Result Value Ref Range Status   Specimen  Description BLOOD RIGHT HAND  Final   Special Requests   Final    BOTTLES DRAWN AEROBIC AND ANAEROBIC AEB=10CC ANA=8CC   Culture NO GROWTH 5 DAYS  Final   Report Status 04/11/2014 FINAL  Final  Culture, Urine     Status: None   Collection Time: 04/06/14 10:30 AM  Result Value Ref Range Status   Specimen Description URINE, CLEAN CATCH  Final   Special Requests NONE  Final   Colony Count   Final    50,000 COLONIES/ML Performed at [facility]    Culture   Final    Multiple bacterial morphotypes present, none predominant. Suggest appropriate recollection if clinically indicated. Performed at [facility]    Report Status 04/07/2014 FINAL  Final  Culture, sputum-assessment     Status: None   Collection Time: 04/11/14 11:46 AM  Result Value Ref Range Status   Specimen Description SPUTUM  Final   Special Requests anergy or immune suppression.  Final   Sputum evaluation   Final    MICROSCOPIC FINDINGS SUGGEST THAT THIS SPECIMEN IS NOT REPRESENTATIVE OF LOWER RESPIRATORY SECRETIONS. PLEASE RECOLLECT. Gram Stain Report Called to,Read Back By and Verified With: L.BULLINS AT 0093 ON 04/11/14 BY S.VANHOORNE    Report Status 04/11/2014 FINAL  Final   Studies/Results: Dg Chest Port 1 View  04/13/2014   CLINICAL DATA:  Increasing shortness of Breath  EXAM: PORTABLE CHEST - 1 VIEW  COMPARISON:  04/06/2014  FINDINGS: Cardiac shadow is within normal limits. The lungs are well aerated bilaterally. No focal infiltrate or sizable effusion is seen. No bony abnormality is noted. Postsurgical changes are again seen in the cervical spine.  IMPRESSION: No acute abnormality noted.   Electronically Signed   By: Inez Catalina M.D.   On: 04/13/2014 15:34    Medications:  Prior to Admission:  Prescriptions prior to admission  Medication Sig Dispense Refill Last Dose  . acetaminophen (TYLENOL) 500 MG tablet Take 500 mg by mouth every 4 (four) hours as needed for mild pain.    unknown  .  albuterol-ipratropium (COMBIVENT) 18-103 MCG/ACT inhaler Inhale 2 puffs into the lungs every 6 (six) hours as needed for wheezing. 14.7 g 3 04/05/2014 at Unknown time  . amLODipine (NORVASC) 10 MG tablet Take 1 tablet (10 mg total) by mouth daily. 90 tablet 1 04/05/2014 at Unknown time  . aspirin EC 81 MG tablet Take 81 mg by mouth daily.   04/05/2014 at Unknown time  . b complex-vitamin c-folic acid (NEPHRO-VITE) 0.8 MG TABS tablet TAKE 1 TABLET BY MOUTH EVERY DAY 30 tablet 5 04/05/2014 at Unknown time  . carvedilol (COREG) 25 MG tablet Take 1 tablet (25 mg total) by mouth 2 (two) times daily. Take one tablet in the morning and one tablet in the evening 180 tablet 1 04/05/2014 at 2300  . Cholecalciferol 5000 UNITS TABS Take 5,000 Units by mouth every morning.   04/05/2014 at Unknown time  . diphenhydrAMINE (BENADRYL) 25 MG tablet Take 50 mg by mouth 2 (two) times daily.    04/05/2014 at Unknown time  . doxazosin (CARDURA) 4 MG tablet Take 0.5 tablet  by mouth in the am and 0.5 tablet by mouth in the pm to regulate blood pressure. (Patient taking differently: Take 2 mg by mouth 2 (two) times daily. Take 0.5 tablet  by mouth in the am and 0.5  tablet by mouth in the pm to regulate blood pressure.) 90 tablet 1 04/05/2014 at Unknown time  . lanthanum (FOSRENOL) 1000 MG chewable tablet Chew 500-1,000 mg by mouth 4 (four) times daily. 1 tablet three times daily with each meal and 1/2 tablet with snack.   04/05/2014 at Unknown time  . nystatin-triamcinolone (MYCOLOG II) cream Apply twice daily to rash on abdomen (Patient taking differently: Apply 1 application topically 2 (two) times daily. Apply twice daily to rash on abdomen) 30 g 0 unknown  . ondansetron (ZOFRAN ODT) 4 MG disintegrating tablet Take 1 tablet (4 mg total) by mouth every 8 (eight) hours as needed for nausea or vomiting. 20 tablet 0 unknown  . pravastatin (PRAVACHOL) 40 MG tablet Take 1 tablet (40 mg total) by mouth every evening. 90 tablet 1 04/05/2014 at  Unknown time  . traMADol (ULTRAM) 50 MG tablet One up to 4 times daly if needed for pain (Patient taking differently: Take 50 mg by mouth 4 (four) times daily as needed for moderate pain. One up to 4 times daly if needed for pain) 100 tablet 3 unknown  . traMADol (ULTRAM-ER) 100 MG 24 hr tablet Take 100 mg by mouth 4 (four) times daily as needed for pain.   Past Month at Unknown time   Scheduled: . amLODipine  10 mg Oral Daily  . aspirin EC  81 mg Oral Daily  . carvedilol  25 mg Oral BID  . doxazosin  2 mg Oral Q12H  . epoetin (EPOGEN/PROCRIT) injection  10,000 Units Subcutaneous Q M,W,F-HD  . guaiFENesin  1,200 mg Oral BID  . insulin aspart  0-5 Units Subcutaneous QHS  . insulin aspart  0-9 Units Subcutaneous TID WC  . insulin aspart  3 Units Subcutaneous TID WC  . ipratropium-albuterol  3 mL Nebulization Q4H  . lanthanum  1,500 mg Oral TID WC  . levofloxacin (LEVAQUIN) IV  500 mg Intravenous Q48H  . methylPREDNISolone (SOLU-MEDROL) injection  60 mg Intravenous Q6H  . piperacillin-tazobactam (ZOSYN)  IV  2.25 g Intravenous Q8H  . polyethylene glycol  17 g Oral BID  . pravastatin  40 mg Oral QPM  . sodium chloride  3 mL Intravenous Q12H   Continuous:  BJY:NWGNFA chloride, sodium chloride, sodium chloride, sodium chloride, sodium chloride, sodium chloride, sodium chloride, acetaminophen **OR** acetaminophen, albuterol, ALPRAZolam, benzonatate, feeding supplement (NEPRO CARB STEADY), guaiFENesin-dextromethorphan, heparin, heparin, lanthanum, lidocaine (PF), lidocaine-prilocaine, ondansetron (ZOFRAN) IV, pentafluoroprop-tetrafluoroeth, promethazine, sodium chloride, traMADol  Assesment: He was admitted with acute respiratory failure with hypoxia. He has obstructive sleep apnea. He has healthcare associated pneumonia. In general he seems to be improving. He has end-stage renal disease on dialysis. He may have a diagnosis of COPD based on his history Principal Problem:   Acute respiratory  failure with hypoxia Active Problems:   OSA on CPAP   HTN (hypertension)   DM (diabetes mellitus), type 2 with renal complications   Anemia in chronic renal disease   History of kidney transplant   End-stage renal disease on hemodialysis   HCAP (healthcare-associated pneumonia)   Thrombocytopenia    Plan: Continue current treatments. He is going to be reassessed to see if he is going to need oxygen at home    LOS: 9 days   [place] L 04/15/2014, 9:01 AM

## 2014-04-15 NOTE — Progress Notes (Signed)
Subjective: Interval History: Patient feels much better. Still has some cough with yellowish sputum production. Has some exertional dyspnea when he is walking otherwise feels okay. Still using oxygen  Objective: Vital signs in last 24 hours: Temp:  [98.2 F (36.8 C)-98.6 F (37 C)] 98.3 F (36.8 C) (03/24 0427) Pulse Rate:  [82-103] 85 (03/24 0427) Resp:  [20-22] 20 (03/24 0427) BP: (145-180)/(66-89) 156/81 mmHg (03/24 0427) SpO2:  [90 %-97 %] 95 % (03/24 0701) Weight:  [102.7 kg (226 lb 6.6 oz)-106.1 kg (233 lb 14.5 oz)] 102.7 kg (226 lb 6.6 oz) (03/23 2007) Weight change: 0.503 kg (1 lb 1.7 oz)  Intake/Output from previous day: 03/23 0701 - 03/24 0700 In: 410 [P.O.:360; IV Piggyback:50] Out: [id number] [Urine:200] Intake/Output this shift:   Generally patient is alert and in no apparent distress. He was coughing. His sputum production seems to be mostly white. Chest: Decreased breath sound bilaterally he has also some expiratory wheezing. His heart exam regular rate and rhythm no murmur Extremities no edema   Lab Results:  Recent Labs  04/14/14 0540  WBC 6.4  HGB 10.3*  HCT 30.1*  PLT 135*   BMET:   Recent Labs  04/14/14 0540 04/15/14 0530  NA 135 136  K 5.5* 4.6  CL 97 97  CO2 24 26  GLUCOSE 167* 233*  BUN 55* 43*  CREATININE 12.31* 8.43*  CALCIUM 9.0 9.0   No results for input(s): PTH in the last 72 hours. Iron Studies: No results for input(s): IRON, TIBC, TRANSFERRIN, FERRITIN in the last 72 hours.  Studies/Results: Dg Chest Port 1 View  04/13/2014   CLINICAL DATA:  Increasing shortness of Breath  EXAM: PORTABLE CHEST - 1 VIEW  COMPARISON:  04/06/2014  FINDINGS: Cardiac shadow is within normal limits. The lungs are well aerated bilaterally. No focal infiltrate or sizable effusion is seen. No bony abnormality is noted. Postsurgical changes are again seen in the cervical spine.  IMPRESSION: No acute abnormality noted.   Electronically Signed   By: Inez Catalina  M.D.   On: 04/13/2014 15:34    I have reviewed the patient's current medications.  Assessment/Plan: Problem #1 Healthcare associated Pneumonia: Patient is progressively improving. Presently has less cough and sputum production. Denies any fever or chills or sweating. Problem #2 end-stage renal disease: Status post hemodialysis yesterday and presently he is asymptomatic. Problem #3 anemia: His hemoglobin has come up to our target goal. Patient presently on Neupogen during dialysis. Problem #4 metabolic bone disease: Presently patient is on Fosrenol. His calcium and phosphorus is within or target goal. Problem #5 history of hypertension: His blood pressure is reasonably controlled Problem #6 history of sleep apnea: Patient on CPAP at home Problem #7 history of diabetes Plan: We'll make arrangements for patient to get dialysis tomorrow which is his regular day. If patient is going to be  discharge he will  go to his regular unit as an outpatient.   LOS: 9 days   Adael Culbreath S 04/15/2014,8:51 AM

## 2014-04-15 NOTE — Progress Notes (Signed)
PROGRESS NOTE  Kirk [name] [id number] DOB: 03/30/1960 DOA: 04/06/2014 PCP: Estill Dooms, MD  Summary: 54 year old man presented with shortness of breath, admitted for acute hypoxic respiratory failure, subsequently diagnosed with healthcare associated pneumonia.  Assessment/Plan: 1. Acute hypoxic respiratory failure secondary to pneumonia. Repeat CT with mixed findings with improvement in the lower lobes and some new infiltrates in the upper lobes. Patient had worsening shortness of breath on 3/22 requiring a Ventimask. He was started on intravenous steroids. ABG indicated hypoxia. He was continued on bronchodilators. He feels that his breathing is improving. He is now down to minimal supplemental oxygen. We'll continue to wean off as tolerated..  2. Healthcare associated pneumonia, afebrile, but still coughing and CT with evidence of some new infiltrates despite more than 7 days of antibiotics. Strep and legionella urinary antigens negative. Influenza PCR negative. Antibiotics changed from cefepime to zosyn and levaquin. Appreciate pulmonology input. 3. Nausea without vomiting. Resolved. AXR non-acute 4. Diabetes mellitus type 2 with diabetic nephropathy. Remains stable. 5. Hypertension. Remains stable. 6. Anemia of chronic disease. Remains stable. 7. Leukopenia, stable. Suspect secondary to pneumonia. HIV screen negative. 8. Chronic thrombocytopenia. Improving.Follow-up as an outpatient. 9. End-stage renal disease. Per nephrology. 10. Obstructive sleep apnea, CPAP. 11. Possible COPD exacerbation. Patient reports a history of possible COPD in the past and was prescribed inhalers. He appears to have improved with intravenous steroids and bronchodilators. We'll transition him to prednisone taper today. Continue current treatments  Code Status: full code DVT prophylaxis: SCDs Family Communication: discussed with wife at the bedside Disposition Plan: home  Kathie Dike, MD  [facility]  Pager [phone] If 7PM-7AM, please contact night-coverage at [email or web address], password [facility] 04/15/2014, 5:18 PM  LOS: 9 days   Consultants:  pulmonology  Procedures:  Echo: - Left ventricle: Mild to moderate left ventricular enlargement. Wall thickness was increased in a pattern of mild LVH. Systolic function was normal. The estimated ejection fraction was in the range of 55% to 60%. Left ventricular diastolic function parameters were normal. - Tricuspid valve: There was mild regurgitation. - Pulmonary arteries: Incomplete tricuspid regurgitant jet to accurately assess pulmonary pressures.  Antibiotics: Vancomycin 3/15 >> 3/21 Cefepime 3/15 >> 3/21 Zosyn 3/21>>3/24 Levofloxacin 3/22>>   HPI/Subjective: Having productive cough. Overall feeling significantly improved. Less short of breath. In good spirits.  Objective: Filed Vitals:   04/15/14 1107 04/15/14 1530 04/15/14 1531 04/15/14 1600  BP:    155/67  Pulse:    85  Temp:    97.6 F (36.4 C)  TempSrc:    Oral  Resp:    20  Height:      Weight:      SpO2: 94% 96% 96% 97%    Intake/Output Summary (Last 24 hours) at 04/15/14 1718 Last data filed at 04/15/14 1300  Gross per 24 hour  Intake    483 ml  Output   3773 ml  Net  -3290 ml     Filed Weights   04/14/14 0429 04/14/14 1545 04/14/14 2007  Weight: 105.597 kg (232 lb 12.8 oz) 106.1 kg (233 lb 14.5 oz) 102.7 kg (226 lb 6.6 oz)    Exam:    General: Appears calm. comfortable Cardiovascular: RRR, no m/r/g. No LE edema. Respiratory: lungs are diminished but clear, respiratory effort appears normal Abdomen soft, nontender nondistended.  Psychiatric: grossly normal mood and affect, speech fluent and appropriate Neurologic: grossly non-focal.   Scheduled Meds: . amLODipine  10 mg Oral Daily  . aspirin EC  81  mg Oral Daily  . carvedilol  25 mg Oral BID  . doxazosin  2 mg Oral Q12H  . epoetin (EPOGEN/PROCRIT) injection  10,000 Units  Subcutaneous Q M,W,F-HD  . guaiFENesin  1,200 mg Oral BID  . insulin aspart  0-5 Units Subcutaneous QHS  . insulin aspart  0-9 Units Subcutaneous TID WC  . insulin aspart  3 Units Subcutaneous TID WC  . ipratropium-albuterol  3 mL Nebulization Q4H  . lanthanum  1,500 mg Oral TID WC  . [START ON 04/17/2014] levofloxacin  500 mg Oral Q48H  . methylPREDNISolone (SOLU-MEDROL) injection  60 mg Intravenous Q6H  . piperacillin-tazobactam (ZOSYN)  IV  2.25 g Intravenous Q8H  . polyethylene glycol  17 g Oral BID  . pravastatin  40 mg Oral QPM  . sodium chloride  3 mL Intravenous Q12H   Continuous Infusions:   Principal Problem:   Acute respiratory failure with hypoxia Active Problems:   OSA on CPAP   HTN (hypertension)   DM (diabetes mellitus), type 2 with renal complications   Anemia in chronic renal disease   History of kidney transplant   End-stage renal disease on hemodialysis   HCAP (healthcare-associated pneumonia)   Thrombocytopenia   Time spent 30 minutes

## 2014-04-15 NOTE — Progress Notes (Signed)
SATURATION QUALIFICATIONS: (This note is used to comply with regulatory documentation for home oxygen)  Patient Saturations on Room Air at Rest = 95%  Patient Saturations on Room Air while Ambulating = 90%  Patient Saturations on 2 Liters of oxygen while Ambulating = 98%

## 2014-04-15 NOTE — Progress Notes (Addendum)
Inpatient Diabetes Program Recommendations  AACE/ADA: New Consensus Statement on Inpatient Glycemic Control (2013)  Target Ranges:  Prepandial:   less than 140 mg/dL      Peak postprandial:   less than 180 mg/dL (1-2 hours)      Critically ill patients:  140 - 180 mg/dL   Results for Kirk [name], Kirk [name] (MRN [id number]) as of 04/15/2014 09:20  Ref. Range 04/14/2014 08:06 04/14/2014 11:54 04/14/2014 20:42 04/15/2014 08:18  Glucose-Capillary Latest Range: 70-99 mg/dL 189 (H) 174 (H) 161 (H) 249 (H)   Diabetes history: DM2 Outpatient Diabetes medications: none Current orders for Inpatient glycemic control: Novolog 0-9 units TID with meals, Novolog 0-5 units HS, Novolog 3 units TID with meals  Inpatient Diabetes Program Recommendations Correction (SSI): Noted patient did not receive Novolog bedtime correction last night since he ate late last night after dialysis. As a result CBG this morning is 249 mg/dl.   Thanks, Barnie Alderman, RN, MSN, CCRN, CDE Diabetes Coordinator Inpatient Diabetes Program [phone] (Team Pager from [place] to [place]) [phone] ([facility] office) [phone] [facility] office)

## 2014-04-16 DIAGNOSIS — G4733 Obstructive sleep apnea (adult) (pediatric): Secondary | ICD-10-CM

## 2014-04-16 LAB — BASIC METABOLIC PANEL
Anion gap: 14 (ref 5–15)
BUN: 80 mg/dL — ABNORMAL HIGH (ref 6–23)
CO2: 24 mmol/L (ref 19–32)
Calcium: 8.6 mg/dL (ref 8.4–10.5)
Chloride: 98 mmol/L (ref 96–112)
Creatinine, Ser: 11.43 mg/dL — ABNORMAL HIGH (ref 0.50–1.35)
GFR calc Af Amer: 5 mL/min — ABNORMAL LOW (ref >90)
GFR, EST NON AFRICAN AMERICAN: 4 mL/min — AB (ref >90)
GLUCOSE: 145 mg/dL — AB (ref 70–99)
Potassium: 4.5 mmol/L (ref 3.5–5.1)
SODIUM: 136 mmol/L (ref 135–145)

## 2014-04-16 LAB — GLUCOSE, CAPILLARY
Glucose-Capillary: 221 mg/dL — ABNORMAL HIGH (ref 70–99)
Glucose-Capillary: 169 mg/dL — ABNORMAL HIGH (ref 70–99)

## 2014-04-16 MED ORDER — EPOETIN ALFA 10000 UNIT/ML IJ SOLN
INTRAMUSCULAR | Status: AC
  Administered 2014-04-16: 10000 [IU] via SUBCUTANEOUS
  Filled 2014-04-16: qty 1

## 2014-04-16 MED ORDER — IPRATROPIUM-ALBUTEROL 0.5-2.5 (3) MG/3ML IN SOLN
3.0000 mL | Freq: Three times a day (TID) | RESPIRATORY_TRACT | Status: DC
  Administered 2014-04-16: 3 mL via RESPIRATORY_TRACT
  Filled 2014-04-16 (×2): qty 3

## 2014-04-16 MED ORDER — SODIUM CHLORIDE 0.9 % IV SOLN: 100.0000 mL | INTRAVENOUS | Status: DC | PRN

## 2014-04-16 MED ORDER — HEPARIN SODIUM (PORCINE) 1000 UNIT/ML IJ SOLN
INTRAMUSCULAR | Status: AC
  Filled 2014-04-16: qty 3

## 2014-04-16 MED ORDER — ALTEPLASE 2 MG IJ SOLR: 2.0000 mg | Freq: Once | INTRAMUSCULAR | Status: DC | PRN

## 2014-04-16 MED ORDER — ALBUTEROL SULFATE (2.5 MG/3ML) 0.083% IN NEBU
2.5000 mg | INHALATION_SOLUTION | Freq: Four times a day (QID) | RESPIRATORY_TRACT | Status: DC | PRN
Start: 2014-04-16 — End: 2014-11-09

## 2014-04-16 MED ORDER — PREDNISONE 10 MG PO TABS: ORAL_TABLET | ORAL | Status: DC

## 2014-04-16 MED ORDER — LEVOFLOXACIN 500 MG PO TABS: 500.0000 mg | ORAL_TABLET | ORAL | Status: DC

## 2014-04-16 NOTE — Progress Notes (Signed)
He is apparently going to be discharged after dialysis today. He says he feels well. He needs to finish a course of Levaquin and needs follow-up chest x-ray to be sure the infiltrates clear. I will of course SignOff. Thanks for allowing me to see him with you

## 2014-04-16 NOTE — Progress Notes (Signed)
Meds held before dialysis were given to pt. Pt's vitals were stable from dialysis. Removed iv from pt, site clean, dry, and intact. Pt was alert and oriented. Reviewed d/c paperwork with pt including follow-up appointments and pt verbalized understanding. Prescriptions were given to pt. Pt was taken down in wheelchair by NT.

## 2014-04-16 NOTE — Progress Notes (Signed)
Nutrition Brief Note  Patient identified on the Malnutrition Screening Tool (MST) Report  Wt Readings from Last 15 Encounters:  04/16/14 232 lb 12.8 oz (105.597 kg)  03/17/14 232 lb (105.235 kg)  03/05/14 231 lb 8 oz (105.008 kg)  02/17/14 233 lb 0.4 oz (105.7 kg)  12/02/13 224 lb (101.606 kg)  09/01/13 229 lb (103.874 kg)  04/28/13 240 lb (108.863 kg)  01/28/13 237 lb 9.6 oz (107.775 kg)  09/09/12 242 lb 9.6 oz (110.043 kg)  07/08/12 243 lb 12.8 oz (110.587 kg)  05/14/12 256 lb 6.4 oz (116.302 kg)  08/15/11 225 lb 3.2 oz (102.15 kg)  08/09/11 219 lb 5.7 oz (99.5 kg)  06/27/11 230 lb (104.327 kg)  06/01/11 230 lb 6 oz (104.497 kg)    Body mass index is 36.45 kg/(m^2). Patient meets criteria for obesity class II based on current BMI. No significant weight changes noted.  His appetite is good. Current diet order is CHO modified /Heart Healthy, patient is consuming approximately 100% of meals at this time. Labs and medications reviewed.   No nutrition interventions warranted at this time. If nutrition issues arise, please consult RD.   Colman Cater MS,RD,CSG,LDN Office: [phone] Pager: [phone]

## 2014-04-16 NOTE — Procedures (Signed)
   HEMODIALYSIS TREATMENT NOTE:  4 hour HD completed via left forearm AVF (15g ante/retrograde buttonhole). Goal met:  3.5 liters removed.  No interruption in ultrafiltration.  Orthostatic BP stable post HD.  All blood was reinfused.  Hemostasis achieved in 5 minutes.  Report called to primary RN.  Rockwell Alexandria, RN, CDN

## 2014-04-16 NOTE — Progress Notes (Signed)
Subjective: Interval History: Patient says that he is feeling much better. He doesn't have any difficulty breathing and no exertional dyspnea. Presently he is off oxygen. He has occasional cough but much better. And he doesn't have any chest pain.  Objective: Vital signs in last 24 hours: Temp:  [97.6 F (36.4 C)-98.2 F (36.8 C)] 98.1 F (36.7 C) (03/25 0601) Pulse Rate:  [72-85] 72 (03/25 0601) Resp:  [18-20] 19 (03/25 0601) BP: (154-167)/(67-79) 154/70 mmHg (03/25 0601) SpO2:  [92 %-97 %] 95 % (03/25 0649) Weight:  [105.597 kg (232 lb 12.8 oz)] 105.597 kg (232 lb 12.8 oz) (03/25 0601) Weight change: -0.503 kg (-1 lb 1.7 oz)  Intake/Output from previous day: 03/24 0701 - 03/25 0700 In: 806 [P.O.:800; I.V.:6] Out: 103 [Urine:103] Intake/Output this shift:   Generally patient is alert and in no apparent distress. He was coughing. His sputum production seems to be mostly white. Chest: Decreased breath sound bilaterally he has also some expiratory wheezing. His heart exam regular rate and rhythm no murmur Extremities no edema   Lab Results:  Recent Labs  04/14/14 0540  WBC 6.4  HGB 10.3*  HCT 30.1*  PLT 135*   BMET:   Recent Labs  04/15/14 0530 04/16/14 0548  NA 136 136  K 4.6 4.5  CL 97 98  CO2 26 24  GLUCOSE 233* 145*  BUN 43* 80*  CREATININE 8.43* 11.43*  CALCIUM 9.0 8.6   No results for input(s): PTH in the last 72 hours. Iron Studies: No results for input(s): IRON, TIBC, TRANSFERRIN, FERRITIN in the last 72 hours.  Studies/Results: No results found.  I have reviewed the patient's current medications.  Assessment/Plan: Problem #1 Healthcare associated Pneumonia: Patient is progressively improving. Presently is much better. Problem #2 end-stage renal disease: Status post hemodialysis on Wednesday. Presently patient does not have any uremic signs and symptoms. His potassium is normal and he is due for dialysis today. Problem #3 anemia: His hemoglobin  has come up to our target goal. Patient on Epogen Problem #4 metabolic bone disease: Presently patient is on Fosrenol. His calcium and phosphorus is within or target goal. Problem #5 history of hypertension: His blood pressure is reasonably controlled Problem #6 history of sleep apnea: Patient on CPAP. Problem #7 history of diabetes Plan: We'll make arrangements for patient to get dialysis today. If is going to be discharged to go to his regular dialysis on Monday.   LOS: 10 days   Tressia Labrum S 04/16/2014,8:01 AM

## 2014-04-16 NOTE — Discharge Summary (Signed)
Physician Discharge Summary  Kirk [name] [id number] DOB: 05-12-1960 DOA: 04/06/2014  PCP: Kirk Dooms, MD  Admit date: 04/06/2014 Discharge date: 04/16/2014  Time spent: 40 minutes  Recommendations for Outpatient Follow-up:  1. Repeat chest x-ray in 2-3 weeks to follow-up for pneumonia 2. Consider outpatient pulmonary function tests to evaluate for possible COPD 3. Follow-up at regular dialysis center on 3/28 for dialysis 4. Follow-up with primary care physician a 1-2 weeks  Discharge Diagnoses:  Principal Problem:   Acute respiratory failure with hypoxia Active Problems:   OSA on CPAP   HTN (hypertension)   DM (diabetes mellitus), type 2 with renal complications   Anemia in chronic renal disease   History of kidney transplant   End-stage renal disease on hemodialysis   HCAP (healthcare-associated pneumonia)   Thrombocytopenia   Discharge Condition: Improved  Diet recommendation: Low-salt, low carb  Filed Weights   04/14/14 1545 04/14/14 2007 04/16/14 0601  Weight: 106.1 kg (233 lb 14.5 oz) 102.7 kg (226 lb 6.6 oz) 105.597 kg (232 lb 12.8 oz)    History of present illness:  This is a 54 year old gentleman who presents to the emergency room with shortness of breath. He was found to have acute hypoxic respiratory failure secondary to healthcare associated pneumonia. He was admitted to the hospital for further treatments.  Hospital Course:  Patient was started on broad-spectrum antibiotics for healthcare associated pneumonia. Initial chest x-ray did not show any acute findings. CT imaging was done which ruled out pulmonary embolus but did show evidence of developing ammonia. With antibiotic therapy, he did initially improve, but subsequently began to develop hypoxia once again. The patient briefly required Ventimask to help his oxygen saturations. He was seen in consultation by pulmonology who recommended starting him on steroids. With intravenous steroid therapy,  patient's condition quickly improved and within 2 days he was breathing comfortably on room air. He does report a possible prior history of COPD, but did not take any inhalers as were prescribed to him. At this point, the patient is breathing comfortably on room air. He was placed on a prednisone taper and completed a course of antibiotics. He would likely benefit from repeat chest x-ray in 3 weeks to ensure all acute findings has cleared. He would also benefit from outpatient pulmonary function tests.  The patient was seen by nephrology for his dialysis needs. He is continued on his regular dialysis schedule.  Patient is doing significantly better at this time and is ready for discharge home. He will follow-up with his primary care physician a 1-2 weeks.  Procedures:  Echo: - Left ventricle: Mild to moderate left ventricular enlargement. Wall thickness was increased in a pattern of mild LVH. Systolic function was normal. The estimated ejection fraction was in the range of 55% to 60%. Left ventricular diastolic function parameters were normal. - Tricuspid valve: There was mild regurgitation. - Pulmonary arteries: Incomplete tricuspid regurgitant jet to accurately assess pulmonary pressures.  Consultations:  Nephrology  Pulmonology  Discharge Exam: Filed Vitals:   04/16/14 1800  BP: 169/86  Pulse: 74  Temp:   Resp:     General: NAD Cardiovascular: S1, S2 RRR Respiratory: CTA B  Discharge Instructions   Discharge Instructions    Call MD for:  difficulty breathing, headache or visual disturbances    Complete by:  As directed      Call MD for:  temperature >100.4    Complete by:  As directed      Diet - low sodium  heart healthy    Complete by:  As directed      Increase activity slowly    Complete by:  As directed           Current Discharge Medication List    START taking these medications   Details  albuterol (PROVENTIL) (2.5 MG/3ML) 0.083% nebulizer  solution Take 3 mLs (2.5 mg total) by nebulization every 6 (six) hours as needed for wheezing. Qty: 75 mL, Refills: 12    levofloxacin (LEVAQUIN) 500 MG tablet Take 1 tablet (500 mg total) by mouth every other day. Qty: 2 tablet, Refills: 0    predniSONE (DELTASONE) 10 MG tablet Take 40mg  po daily for 2 days then 30mg  po daily for 2 days then 20mg  po daily for 2 days then 10mg  po daily for 2 days then stop Qty: 20 tablet, Refills: 0      CONTINUE these medications which have NOT CHANGED   Details  acetaminophen (TYLENOL) 500 MG tablet Take 500 mg by mouth every 4 (four) hours as needed for mild pain.     albuterol-ipratropium (COMBIVENT) 18-103 MCG/ACT inhaler Inhale 2 puffs into the lungs every 6 (six) hours as needed for wheezing. Qty: 14.7 g, Refills: 3    amLODipine (NORVASC) 10 MG tablet Take 1 tablet (10 mg total) by mouth daily. Qty: 90 tablet, Refills: 1    aspirin EC 81 MG tablet Take 81 mg by mouth daily.    b complex-vitamin c-folic acid (NEPHRO-VITE) 0.8 MG TABS tablet TAKE 1 TABLET BY MOUTH EVERY DAY Qty: 30 tablet, Refills: 5    carvedilol (COREG) 25 MG tablet Take 1 tablet (25 mg total) by mouth 2 (two) times daily. Take one tablet in the morning and one tablet in the evening Qty: 180 tablet, Refills: 1    Cholecalciferol 5000 UNITS TABS Take 5,000 Units by mouth every morning.    diphenhydrAMINE (BENADRYL) 25 MG tablet Take 50 mg by mouth 2 (two) times daily.     doxazosin (CARDURA) 4 MG tablet Take 0.5 tablet  by mouth in the am and 0.5 tablet by mouth in the pm to regulate blood pressure. Qty: 90 tablet, Refills: 1    lanthanum (FOSRENOL) 1000 MG chewable tablet Chew 500-1,000 mg by mouth 4 (four) times daily. 1 tablet three times daily with each meal and 1/2 tablet with snack.    nystatin-triamcinolone (MYCOLOG II) cream Apply twice daily to rash on abdomen Qty: 30 g, Refills: 0   Associated Diagnoses: Fungal dermatitis    ondansetron (ZOFRAN ODT) 4 MG  disintegrating tablet Take 1 tablet (4 mg total) by mouth every 8 (eight) hours as needed for nausea or vomiting. Qty: 20 tablet, Refills: 0    pravastatin (PRAVACHOL) 40 MG tablet Take 1 tablet (40 mg total) by mouth every evening. Qty: 90 tablet, Refills: 1    traMADol (ULTRAM) 50 MG tablet One up to 4 times daly if needed for pain Qty: 100 tablet, Refills: 3   Associated Diagnoses: Tension headache      STOP taking these medications     traMADol (ULTRAM-ER) 100 MG 24 hr tablet        Allergies  Allergen Reactions  . Codeine Nausea And Vomiting and Nausea Only   Follow-up Information    Follow up with follow up on monday at regular dialysis center.      Follow up with GREEN, [name], MD. Schedule an appointment as soon as possible for a visit in 2 weeks.  Specialty:  Internal Medicine   Why:  you need a repeat chest xray in 2-3 weeks to ensure clearing of pneumonia   Contact information:   [place] [id number] [phone]        The results of significant diagnostics from this hospitalization (including imaging, microbiology, ancillary and laboratory) are listed below for reference.    Significant Diagnostic Studies: Dg Abd 1 View  04/10/2014   CLINICAL DATA:  Epigastric pain.  EXAM: ABDOMEN - 1 VIEW  COMPARISON:  CT 02/17/2014  FINDINGS: There is dense barium within the transverse colon, descending colon and rectosigmoid colon similar to comparison CT. No dilated to large or small bowel.  IMPRESSION: No evidence of bowel obstruction.  Retained barium within the colon.   Electronically Signed   By: Suzy Bouchard M.D.   On: 04/10/2014 17:53   Ct Chest Wo Contrast  04/12/2014   CLINICAL DATA:  Chest pain earlier today, shortness of breath, coughing, hypoxia, question pneumonia, diabetes, dialysis for 5 years, hypertension  EXAM: CT CHEST WITHOUT CONTRAST  TECHNIQUE: Multidetector CT imaging of the chest was performed following the standard protocol  without IV contrast. Sagittal and coronal MPR images reconstructed from axial data set.  COMPARISON:  04/06/2014  FINDINGS: Contracted gallbladder containing calculi.  Remaining visualized portion of upper abdomen unremarkable.  Minimal atherosclerotic calcification aorta.  No thoracic adenopathy.  Scattered BILATERAL patchy pulmonary infiltrates in both lungs consistent with pneumonia.  Decreased atelectasis and infiltrates in both lower lobes.  No pleural effusion or pneumothorax.  Mild central peribronchial thickening.  No discrete pulmonary mass identified.  Osseous structures unremarkable.  IMPRESSION: Mild patchy BILATERAL pulmonary infiltrates consistent with pneumonia, overall demonstrating a few new sites of patchy involvement in the upper lobes and improvement in aeration at the lower lobes.   Electronically Signed   By: Lavonia Dana M.D.   On: 04/12/2014 17:40   Ct Angio Chest Pe W/cm &/or Wo Cm  04/06/2014   CLINICAL DATA:  Shortness of breath for 2 days  EXAM: CT ANGIOGRAPHY CHEST WITH CONTRAST  TECHNIQUE: Multidetector CT imaging of the chest was performed using the standard protocol during bolus administration of intravenous contrast. Multiplanar CT image reconstructions and MIPs were obtained to evaluate the vascular anatomy.  CONTRAST:  171mL OMNIPAQUE IOHEXOL 350 MG/ML SOLN  COMPARISON:  None.  FINDINGS: The lungs are well aerated bilaterally with patchy lower lobe infiltrates slightly worse on the left than the right. A few tiny partially calcified nodules are identified.  The thoracic inlet is within normal limits. The thoracic aorta and its branches are unremarkable. The pulmonary artery shows no definitive pulmonary emboli. No significant hilar or mediastinal adenopathy is noted.  The visualized upper abdomen shows dependent gallstones. No acute bony abnormality is noted.  Review of the MIP images confirms the above findings.  IMPRESSION: Patchy bilateral lower lobe infiltrates left greater  than right.  No definitive pulmonary emboli seen.  Scattered small partially calcified nodules likely related to prior granulomatous disease.   Electronically Signed   By: Inez Catalina M.D.   On: 04/06/2014 13:20   Dg Chest Port 1 View  04/13/2014   CLINICAL DATA:  Increasing shortness of Breath  EXAM: PORTABLE CHEST - 1 VIEW  COMPARISON:  04/06/2014  FINDINGS: Cardiac shadow is within normal limits. The lungs are well aerated bilaterally. No focal infiltrate or sizable effusion is seen. No bony abnormality is noted. Postsurgical changes are again seen in the cervical spine.  IMPRESSION: No acute abnormality noted.   Electronically Signed   By: Inez Catalina M.D.   On: 04/13/2014 15:34   Dg Chest Port 1 View  04/06/2014   CLINICAL DATA:  Dyspnea  EXAM: PORTABLE CHEST - 1 VIEW  COMPARISON:  09/08/2013, 08/07/2011  FINDINGS: There are mildly prominent hilar contours on the left, unchanged. The lungs are clear. There are no large effusions. Heart size is unchanged.  IMPRESSION: No active disease.   Electronically Signed   By: Andreas Newport M.D.   On: 04/06/2014 04:47    Microbiology: Recent Results (from the past 240 hour(s))  Culture, sputum-assessment     Status: None   Collection Time: 04/11/14 11:46 AM  Result Value Ref Range Status   Specimen Description SPUTUM  Final   Special Requests anergy or immune suppression.  Final   Sputum evaluation   Final    MICROSCOPIC FINDINGS SUGGEST THAT THIS SPECIMEN IS NOT REPRESENTATIVE OF LOWER RESPIRATORY SECRETIONS. PLEASE RECOLLECT. Gram Stain Report Called to,Read Back By and Verified With: L.BULLINS AT [id number] ON 04/11/14 BY S.VANHOORNE    Report Status 04/11/2014 FINAL  Final     Labs: Basic Metabolic Panel:  Recent Labs Lab 04/12/14 0542 04/13/14 0545 04/14/14 0540 04/15/14 0530 04/16/14 0548  NA 136 140 135 136 136  K 4.8 4.4 5.5* 4.6 4.5  CL 98 101 97 97 98  CO2 24 28 24 26 24   GLUCOSE 106* 116* 167* 233* 145*  BUN 69* 39* 55* 43*  80*  CREATININE 14.61* 9.89* 12.31* 8.43* 11.43*  CALCIUM 8.2* 8.8 9.0 9.0 8.6  PHOS  --  4.1  --   --   --    Liver Function Tests:  Recent Labs Lab 04/11/14 0643  AST 23  ALT 24  ALKPHOS 43  BILITOT 1.0  PROT 6.7  ALBUMIN 3.3*    Recent Labs Lab 04/11/14 0643  LIPASE 52   No results for input(s): AMMONIA in the last 168 hours. CBC:  Recent Labs Lab 04/11/14 0643 04/12/14 0542 04/14/14 0540  WBC 3.6* 3.4* 6.4  HGB 9.3* 9.1* 10.3*  HCT 27.6* 27.0* 30.1*  MCV 98.2 97.5 97.4  PLT 87* 99* 135*   Cardiac Enzymes: No results for input(s): CKTOTAL, CKMB, CKMBINDEX, TROPONINI in the last 168 hours. BNP: BNP (last 3 results)  Recent Labs  04/06/14 0340  BNP 264.0*    ProBNP (last 3 results) No results for input(s): PROBNP in the last 8760 hours.  CBG:  Recent Labs Lab 04/15/14 1139 04/15/14 1614 04/15/14 2129 04/16/14 0749 04/16/14 1201  GLUCAP 231* 241* 253* 221* 169*       Signed:  MEMON,JEHANZEB  [facility] 04/16/2014, 6:11 PM

## 2014-04-19 DIAGNOSIS — N186 End stage renal disease: Secondary | ICD-10-CM | POA: Diagnosis not present

## 2014-04-19 DIAGNOSIS — D631 Anemia in chronic kidney disease: Secondary | ICD-10-CM | POA: Diagnosis not present

## 2014-04-19 NOTE — Care Management Utilization Note (Signed)
UR completed 

## 2014-04-19 NOTE — Care Management Note (Signed)
Contacted by patient's wife that [place] did not have patient's information when they went to pick up nebulizer. CM re-faxed order and pt's information, called [facility] and verified they received the order and pt information for neb machine, relayed information for pt's diagnosis of chronic asthma. CM called pt's wife to tell her they could pick up neb machine from [facility] in [place].

## 2014-04-21 DIAGNOSIS — N186 End stage renal disease: Secondary | ICD-10-CM | POA: Diagnosis not present

## 2014-04-21 DIAGNOSIS — D631 Anemia in chronic kidney disease: Secondary | ICD-10-CM | POA: Diagnosis not present

## 2014-04-22 DIAGNOSIS — Z992 Dependence on renal dialysis: Secondary | ICD-10-CM | POA: Diagnosis not present

## 2014-04-22 DIAGNOSIS — T8612 Kidney transplant failure: Secondary | ICD-10-CM | POA: Diagnosis not present

## 2014-04-22 DIAGNOSIS — N186 End stage renal disease: Secondary | ICD-10-CM | POA: Diagnosis not present

## 2014-04-23 DIAGNOSIS — N186 End stage renal disease: Secondary | ICD-10-CM | POA: Diagnosis not present

## 2014-04-23 DIAGNOSIS — E8779 Other fluid overload: Secondary | ICD-10-CM | POA: Diagnosis not present

## 2014-04-23 DIAGNOSIS — D631 Anemia in chronic kidney disease: Secondary | ICD-10-CM | POA: Diagnosis not present

## 2014-04-26 DIAGNOSIS — E8779 Other fluid overload: Secondary | ICD-10-CM | POA: Diagnosis not present

## 2014-04-26 DIAGNOSIS — N186 End stage renal disease: Secondary | ICD-10-CM | POA: Diagnosis not present

## 2014-04-26 DIAGNOSIS — D631 Anemia in chronic kidney disease: Secondary | ICD-10-CM | POA: Diagnosis not present

## 2014-04-27 DIAGNOSIS — D631 Anemia in chronic kidney disease: Secondary | ICD-10-CM | POA: Diagnosis not present

## 2014-04-27 DIAGNOSIS — E8779 Other fluid overload: Secondary | ICD-10-CM | POA: Diagnosis not present

## 2014-04-27 DIAGNOSIS — N186 End stage renal disease: Secondary | ICD-10-CM | POA: Diagnosis not present

## 2014-04-28 ENCOUNTER — Ambulatory Visit
Admission: RE | Admit: 2014-04-28 | Discharge: 2014-04-28 | Disposition: A | Discharge status: Home / Self Care | Payer: Medicare Other | Source: Ambulatory Visit | Attending: Internal Medicine | Admitting: Internal Medicine

## 2014-04-28 ENCOUNTER — Encounter: Payer: Self-pay | Admitting: Internal Medicine

## 2014-04-28 ENCOUNTER — Ambulatory Visit (INDEPENDENT_AMBULATORY_CARE_PROVIDER_SITE_OTHER): Payer: Medicare Other | Admitting: Internal Medicine

## 2014-04-28 VITALS — BP 152/78 | HR 88 | Temp 99.0°F | Resp 16 | Ht 67.0 in | Wt 230.0 lb

## 2014-04-28 DIAGNOSIS — E8779 Other fluid overload: Secondary | ICD-10-CM | POA: Diagnosis not present

## 2014-04-28 DIAGNOSIS — D631 Anemia in chronic kidney disease: Secondary | ICD-10-CM | POA: Diagnosis not present

## 2014-04-28 DIAGNOSIS — R609 Edema, unspecified: Secondary | ICD-10-CM

## 2014-04-28 DIAGNOSIS — D61818 Other pancytopenia: Secondary | ICD-10-CM | POA: Insufficient documentation

## 2014-04-28 DIAGNOSIS — I151 Hypertension secondary to other renal disorders: Secondary | ICD-10-CM

## 2014-04-28 DIAGNOSIS — E1121 Type 2 diabetes mellitus with diabetic nephropathy: Secondary | ICD-10-CM

## 2014-04-28 DIAGNOSIS — Z992 Dependence on renal dialysis: Secondary | ICD-10-CM | POA: Diagnosis not present

## 2014-04-28 DIAGNOSIS — J189 Pneumonia, unspecified organism: Secondary | ICD-10-CM | POA: Diagnosis not present

## 2014-04-28 DIAGNOSIS — N2889 Other specified disorders of kidney and ureter: Secondary | ICD-10-CM

## 2014-04-28 DIAGNOSIS — N186 End stage renal disease: Secondary | ICD-10-CM

## 2014-04-28 NOTE — Progress Notes (Signed)
Patient ID: CORDNEY BARSTOW, male   DOB: 01-12-1961, 54 y.o.   MRN: [id number]    Facility  [facility]    Place of Service:   OFFICE   Allergies  Allergen Reactions  . Codeine Nausea And Vomiting and Nausea Only    Chief Complaint  Patient presents with  . Follow-up    ED-Hospital admission follow-up. Need referral order to hematologist, blowing blood clots out of nose. Examine right ear drainage. Chest Xray follow-up.     HPI:  Post hospital visit: Admit date: 04/06/2014 Discharge date: 04/16/2014  Recommendations for Outpatient Follow-up:   Repeat chest x-ray in 2-3 weeks to follow-up for pneumonia  Consider outpatient pulmonary function tests to evaluate for possible COPD  Follow-up at regular dialysis center on 3/28 for dialysis  Follow-up with primary care physician a 1-2 weeks  Discharge Diagnoses:  Principal Problem:  Acute respiratory failure with hypoxia Active Problems:  OSA on CPAP  HTN (hypertension)  DM (diabetes mellitus), type 2 with renal complications  Anemia in chronic renal disease  History of kidney transplant  End-stage renal disease on hemodialysis  HCAP (healthcare-associated pneumonia)  Thrombocytopenia  04/13/14 Echo: - Left ventricle: Mild to moderate left ventricular enlargement. Wall thickness was increased in a pattern of mild LVH. Systolic function was normal. The estimated ejection fraction was in the range of 55% to 60%. Left ventricular diastolic function parameters were normal. - Tricuspid valve: There was mild regurgitation. - Pulmonary arteries: Incomplete tricuspid regurgitant jet to accurately assess pulmonary pressures.  Right ear has been ringing. No pain.  Recent epistaxis.  Pancytopenia - needs referral to Hematology  Type 2 diabetes mellitus with diabetic nephropathy: satisfactory control  Edema: improved  Hypertension secondary to other renal disorders: mild elevation in SBP  End-stage renal  disease on hemodialysis: unchanged  HCAP (healthcare-associated pneumonia) - needs follow up Chest 2 View xray     Medications: Patient's Medications  New Prescriptions   No medications on file  Previous Medications   ACETAMINOPHEN (TYLENOL) 500 MG TABLET    Take 500 mg by mouth every 4 (four) hours as needed for mild pain.    ALBUTEROL (PROVENTIL) (2.5 MG/3ML) 0.083% NEBULIZER SOLUTION    Take 3 mLs (2.5 mg total) by nebulization every 6 (six) hours as needed for wheezing.   ALBUTEROL-IPRATROPIUM (COMBIVENT) 18-103 MCG/ACT INHALER    Inhale 2 puffs into the lungs every 6 (six) hours as needed for wheezing.   AMLODIPINE (NORVASC) 10 MG TABLET    Take 1 tablet (10 mg total) by mouth daily.   ASPIRIN EC 81 MG TABLET    Take 81 mg by mouth daily.   B COMPLEX-VITAMIN C-FOLIC ACID (NEPHRO-VITE) 0.8 MG TABS TABLET    TAKE 1 TABLET BY MOUTH EVERY DAY   CARVEDILOL (COREG) 25 MG TABLET    Take 1 tablet (25 mg total) by mouth 2 (two) times daily. Take one tablet in the morning and one tablet in the evening   CHOLECALCIFEROL 5000 UNITS TABS    Take 5,000 Units by mouth every morning.   DIPHENHYDRAMINE (BENADRYL) 25 MG TABLET    Take 50 mg by mouth 2 (two) times daily.    DOXAZOSIN (CARDURA) 4 MG TABLET    Take 0.5 tablet  by mouth in the am and 0.5 tablet by mouth in the pm to regulate blood pressure.   LANTHANUM (FOSRENOL) 1000 MG CHEWABLE TABLET    Chew 500-1,000 mg by mouth 4 (four) times daily. 1 tablet three times  daily with each meal and 1/2 tablet with snack.   NYSTATIN-TRIAMCINOLONE (MYCOLOG II) CREAM    Apply twice daily to rash on abdomen   ONDANSETRON (ZOFRAN ODT) 4 MG DISINTEGRATING TABLET    Take 1 tablet (4 mg total) by mouth every 8 (eight) hours as needed for nausea or vomiting.   PRAVASTATIN (PRAVACHOL) 40 MG TABLET    Take 1 tablet (40 mg total) by mouth every evening.   TRAMADOL (ULTRAM) 50 MG TABLET    One up to 4 times daly if needed for pain  Modified Medications   No  medications on file  Discontinued Medications   LEVOFLOXACIN (LEVAQUIN) 500 MG TABLET    Take 1 tablet (500 mg total) by mouth every other day.   PREDNISONE (DELTASONE) 10 MG TABLET    Take $Rem'40mg'vxWq$  po daily for 2 days then $RemoveB'30mg'JhBTTGvS$  po daily for 2 days then $RemoveB'20mg'ZiyzPPFF$  po daily for 2 days then $RemoveB'10mg'bwBIjtnG$  po daily for 2 days then stop     Review of Systems  Constitutional: Negative for fever.       Chronically fatigued.  Eyes: Negative.   Respiratory: Negative for shortness of breath.        Dyspnea on exertion. Pneumonia. Hospitalized 04/06/14.  Cardiovascular: Negative for chest pain, palpitations and leg swelling.  Gastrointestinal: Negative for abdominal pain and abdominal distention.  Endocrine:       Diabetes is controlled.  Genitourinary: Negative.        Renal failure and kidney transplant.  Musculoskeletal:       Neck discomfort. Chronic right knee discomfort. Chronic back pains. Unsteady gait.  Skin:       Rash right lower quadrant of the abdomen.  Neurological: Positive for dizziness.       Memory is worse. Feet feel numb. Recurrent headaches  Hematological:       Anemia of CKD.  Psychiatric/Behavioral: Positive for confusion and decreased concentration.       Insomnia. Falls asleep in the day.    Filed Vitals:   04/28/14 1231  BP: 152/78  Pulse: 88  Temp: 99 F (37.2 C)  TempSrc: Oral  Resp: 16  Height: $Remove'5\' 7"'bZKpyLa$  (1.702 m)  Weight: 230 lb (104.327 kg)  SpO2: 97%   Body mass index is 36.01 kg/(m^2).  Physical Exam  Constitutional: He is oriented to person, place, and time.  Obese.  HENT:  Loss of hearing.  Eyes:  Corrective lenses  Neck: No JVD present. No tracheal deviation present. No thyromegaly present.  Cardiovascular: Exam reveals no gallop.   No murmur heard. AF. 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  Abdominal: Soft. Bowel sounds are normal. He exhibits no distension and no mass. There is no tenderness.  Musculoskeletal: Normal range of motion. He exhibits tenderness. He  exhibits no edema.  Scar left knee. Tender right knee  Lymphadenopathy:    He has no cervical adenopathy.  Neurological: He is alert and oriented to person, place, and time. No cranial nerve deficit. Coordination normal.  Positive Tinel sign bilaterally  Skin: No rash noted. No erythema. No pallor.  Psychiatric:  Sleeping during part of the exam.     Labs reviewed: Admission on 04/06/2014, Discharged on 04/16/2014  No results displayed because visit has over 200 results.    Admission on 02/16/2014, Discharged on 02/17/2014  Component Date Value Ref Range Status  . WBC 02/16/2014 4.2  4.0 - 10.5 K/uL Final  . RBC 02/16/2014 3.37* 4.22 - 5.81 MIL/uL Final  . Hemoglobin 02/16/2014  11.8* 13.0 - 17.0 g/dL Final   REPEATED TO VERIFY  . HCT 02/16/2014 35.8* 39.0 - 52.0 % Final  . MCV 02/16/2014 106.2* 78.0 - 100.0 fL Final   REPEATED TO VERIFY  . MCH 02/16/2014 35.0* 26.0 - 34.0 pg Final  . MCHC 02/16/2014 33.0  30.0 - 36.0 g/dL Final  . RDW 02/16/2014 15.2  11.5 - 15.5 % Final  . Platelets 02/16/2014 107* 150 - 400 K/uL Final   PLATELET COUNT CONFIRMED BY SMEAR  . Neutrophils Relative % 02/16/2014 80* 43 - 77 % Final  . Neutro Abs 02/16/2014 3.4  1.7 - 7.7 K/uL Final  . Lymphocytes Relative 02/16/2014 9* 12 - 46 % Final  . Lymphs Abs 02/16/2014 0.4* 0.7 - 4.0 K/uL Final  . Monocytes Relative 02/16/2014 9  3 - 12 % Final  . Monocytes Absolute 02/16/2014 0.4  0.1 - 1.0 K/uL Final  . Eosinophils Relative 02/16/2014 2  0 - 5 % Final  . Eosinophils Absolute 02/16/2014 0.1  0.0 - 0.7 K/uL Final  . Basophils Relative 02/16/2014 1  0 - 1 % Final  . Basophils Absolute 02/16/2014 0.0  0.0 - 0.1 K/uL Final  . Sodium 02/16/2014 138  135 - 145 mmol/L Final  . Potassium 02/16/2014 5.2* 3.5 - 5.1 mmol/L Final  . Chloride 02/16/2014 102  96 - 112 mmol/L Final  . CO2 02/16/2014 21  19 - 32 mmol/L Final  . Glucose, Bld 02/16/2014 169* 70 - 99 mg/dL Final  . BUN 02/16/2014 49* 6 - 23 mg/dL Final    . Creatinine, Ser 02/16/2014 12.47* 0.50 - 1.35 mg/dL Final  . Calcium 02/16/2014 9.3  8.4 - 10.5 mg/dL Final  . Total Protein 02/16/2014 7.3  6.0 - 8.3 g/dL Final  . Albumin 02/16/2014 3.8  3.5 - 5.2 g/dL Final  . AST 02/16/2014 29  0 - 37 U/L Final  . ALT 02/16/2014 29  0 - 53 U/L Final  . Alkaline Phosphatase 02/16/2014 54  39 - 117 U/L Final  . Total Bilirubin 02/16/2014 0.6  0.3 - 1.2 mg/dL Final  . GFR calc non Af Amer 02/16/2014 4* >90 mL/min Final  . GFR calc Af Amer 02/16/2014 5* >90 mL/min Final   Comment: (NOTE) The eGFR has been calculated using the CKD EPI equation. This calculation has not been validated in all clinical situations. eGFR's persistently <90 mL/min signify possible Chronic Kidney Disease.   . Anion gap 02/16/2014 15  5 - 15 Final  . Lipase 02/16/2014 80* 11 - 59 U/L Final  . Color, Urine 02/16/2014 YELLOW  YELLOW Final  . APPearance 02/16/2014 CLEAR  CLEAR Final  . Specific Gravity, Urine 02/16/2014 1.014  1.005 - 1.030 Final  . pH 02/16/2014 5.5  5.0 - 8.0 Final  . Glucose, UA 02/16/2014 NEGATIVE  NEGATIVE mg/dL Final  . Hgb urine dipstick 02/16/2014 SMALL* NEGATIVE Final  . Bilirubin Urine 02/16/2014 NEGATIVE  NEGATIVE Final  . Ketones, ur 02/16/2014 NEGATIVE  NEGATIVE mg/dL Final  . Protein, ur 02/16/2014 100* NEGATIVE mg/dL Final  . Urobilinogen, UA 02/16/2014 0.2  0.0 - 1.0 mg/dL Final  . Nitrite 02/16/2014 NEGATIVE  NEGATIVE Final  . Leukocytes, UA 02/16/2014 NEGATIVE  NEGATIVE Final  . Troponin i, poc 02/16/2014 0.00  0.00 - 0.08 ng/mL Final  . Comment 3 02/16/2014          Final   Comment: Due to the release kinetics of cTnI, a negative result within the first hours of the  onset of symptoms does not rule out myocardial infarction with certainty. If myocardial infarction is still suspected, repeat the test at appropriate intervals.   . Campylobacter by PCR 02/16/2014 Not Detected   Final  . C difficile toxin A/B 02/16/2014 Not Detected    Final  . E coli 0157 by PCR 02/16/2014 Not Detected   Final  . E coli (ETEC) LT/ST 02/16/2014 Not Detected   Final  . E coli (STEC) 02/16/2014 Not Detected   Final  . Salmonella by PCR 02/16/2014 Not Detected   Final  . Shigella by PCR 02/16/2014 Not Detected   Final   Comment: (NOTE) Reference value for all analytes:Not Detected The results of this test should not be used as the sole basis for diagnosis, treatment, or other patient management decisions.xTAG(R) GPP positive results are presumptive and must be confirmed by FDA-cleared tests or other acceptable reference methods.Confirmed positive results do not rule out co-infection with other organisms that are not detected by this test, and may not be the sole or definitive cause of patient illness.Negative xTAG(R) Gastrointestinal Pathogen Panel results in the setting of clinical illness compatible with gastroenteritis may be due to infection by pathogens that are not detected by this test or non-infectious causes such as ulcerative colitis, irritable bowel syndrome, or Crohn's disease.xTAG GPP is not intended to monitor or guide treatment for C. difficile infections. Performed At: [facility] [place], [place] [id number] Ileana Ladd PhD                           [id number]   . Norovirus G!/G2 02/16/2014 Comment   Final   POSITIVE  . Rotavirus A by PCR 02/16/2014 Not Detected   Final  . G lamblia by PCR 02/16/2014 Not Detected   Final  . Cryptosporidium by PCR 02/16/2014 Not Detected   Final  . WBC 02/17/2014 2.4* 4.0 - 10.5 K/uL Final  . RBC 02/17/2014 3.23* 4.22 - 5.81 MIL/uL Final  . Hemoglobin 02/17/2014 11.0* 13.0 - 17.0 g/dL Final  . HCT 02/17/2014 33.8* 39.0 - 52.0 % Final  . MCV 02/17/2014 104.6* 78.0 - 100.0 fL Final  . MCH 02/17/2014 34.1* 26.0 - 34.0 pg Final  . MCHC 02/17/2014 32.5  30.0 - 36.0 g/dL Final  . RDW 02/17/2014 14.7  11.5 - 15.5 % Final  .  Platelets 02/17/2014 102* 150 - 400 K/uL Final   CONSISTENT WITH PREVIOUS RESULT  . Neutrophils Relative % 02/17/2014 41* 43 - 77 % Final  . Lymphocytes Relative 02/17/2014 32  12 - 46 % Final  . Monocytes Relative 02/17/2014 18* 3 - 12 % Final  . Eosinophils Relative 02/17/2014 8* 0 - 5 % Final  . Basophils Relative 02/17/2014 1  0 - 1 % Final  . Neutro Abs 02/17/2014 1.0* 1.7 - 7.7 K/uL Final  . Lymphs Abs 02/17/2014 0.8  0.7 - 4.0 K/uL Final  . Monocytes Absolute 02/17/2014 0.4  0.1 - 1.0 K/uL Final  . Eosinophils Absolute 02/17/2014 0.2  0.0 - 0.7 K/uL Final  . Basophils Absolute 02/17/2014 0.0  0.0 - 0.1 K/uL Final  . RBC Morphology 02/17/2014 POLYCHROMASIA PRESENT   Final   TEARDROP CELLS  . WBC Morphology 02/17/2014 ATYPICAL LYMPHOCYTES   Final  . Sodium 02/17/2014 134* 135 - 145 mmol/L Final  . Potassium 02/17/2014 5.2* 3.5 - 5.1 mmol/L Final  . Chloride 02/17/2014 100  96 - 112  mmol/L Final  . CO2 02/17/2014 20  19 - 32 mmol/L Final  . Glucose, Bld 02/17/2014 100* 70 - 99 mg/dL Final  . BUN 02/17/2014 58* 6 - 23 mg/dL Final  . Creatinine, Ser 02/17/2014 14.32* 0.50 - 1.35 mg/dL Final  . Calcium 02/17/2014 8.6  8.4 - 10.5 mg/dL Final  . Total Protein 02/17/2014 7.0  6.0 - 8.3 g/dL Final  . Albumin 02/17/2014 3.5  3.5 - 5.2 g/dL Final  . AST 02/17/2014 34  0 - 37 U/L Final  . ALT 02/17/2014 36  0 - 53 U/L Final  . Alkaline Phosphatase 02/17/2014 47  39 - 117 U/L Final  . Total Bilirubin 02/17/2014 0.7  0.3 - 1.2 mg/dL Final  . GFR calc non Af Amer 02/17/2014 3* >90 mL/min Final  . GFR calc Af Amer 02/17/2014 4* >90 mL/min Final   Comment: (NOTE) The eGFR has been calculated using the CKD EPI equation. This calculation has not been validated in all clinical situations. eGFR's persistently <90 mL/min signify possible Chronic Kidney Disease.   . Anion gap 02/17/2014 14  5 - 15 Final  . Specimen Description 02/16/2014 STOOL   Final  . Special Requests 02/16/2014 NONE   Final   . Fecal Lactoferrin 02/16/2014    Final                   Value:NEGATIVE Performed at [facility]   . Report Status 02/16/2014 02/17/2014 FINAL   Final  . Specimen Description 02/16/2014 STOOL   Final  . Special Requests 02/16/2014 NONE   Final  . Culture 02/16/2014    Final                   Value:NO SALMONELLA, SHIGELLA, CAMPYLOBACTER, YERSINIA, OR E.COLI 0157:H7 ISOLATED Performed at [facility]   . Report Status 02/16/2014 02/20/2014 FINAL   Final  . C difficile by pcr 02/16/2014 NEGATIVE  NEGATIVE Final  . Squamous Epithelial / LPF 02/16/2014 RARE  RARE Final  . WBC, UA 02/16/2014 0-2  <3 WBC/hpf Final  . RBC / HPF 02/16/2014 0-2  <3 RBC/hpf Final     Assessment/Plan  1. Pancytopenia - Ambulatory referral to Hematology  2. Type 2 diabetes mellitus with diabetic nephropathy Continue current medication  3. Edema improved  4. Hypertension secondary to other renal disorders Continue current medication. BP drops with dialysis.  5. End-stage renal disease on hemodialysis continue  6. HCAP (healthcare-associated pneumonia) - DG Chest 2 View; Future

## 2014-04-30 DIAGNOSIS — N186 End stage renal disease: Secondary | ICD-10-CM | POA: Diagnosis not present

## 2014-04-30 DIAGNOSIS — E8779 Other fluid overload: Secondary | ICD-10-CM | POA: Diagnosis not present

## 2014-04-30 DIAGNOSIS — D631 Anemia in chronic kidney disease: Secondary | ICD-10-CM | POA: Diagnosis not present

## 2014-05-03 ENCOUNTER — Telehealth: Payer: Self-pay | Admitting: Hematology

## 2014-05-03 DIAGNOSIS — N186 End stage renal disease: Secondary | ICD-10-CM | POA: Diagnosis not present

## 2014-05-03 DIAGNOSIS — E8779 Other fluid overload: Secondary | ICD-10-CM | POA: Diagnosis not present

## 2014-05-03 DIAGNOSIS — D631 Anemia in chronic kidney disease: Secondary | ICD-10-CM | POA: Diagnosis not present

## 2014-05-03 NOTE — Telephone Encounter (Signed)
NEW PATIENT APPT- S/W PATIENT WIFE AND GAVE NP APPT FOR 05/17 @ 10:30 W/DR. [place]

## 2014-05-05 DIAGNOSIS — E8779 Other fluid overload: Secondary | ICD-10-CM | POA: Diagnosis not present

## 2014-05-05 DIAGNOSIS — N186 End stage renal disease: Secondary | ICD-10-CM | POA: Diagnosis not present

## 2014-05-05 DIAGNOSIS — D631 Anemia in chronic kidney disease: Secondary | ICD-10-CM | POA: Diagnosis not present

## 2014-05-07 DIAGNOSIS — D631 Anemia in chronic kidney disease: Secondary | ICD-10-CM | POA: Diagnosis not present

## 2014-05-07 DIAGNOSIS — N186 End stage renal disease: Secondary | ICD-10-CM | POA: Diagnosis not present

## 2014-05-07 DIAGNOSIS — E8779 Other fluid overload: Secondary | ICD-10-CM | POA: Diagnosis not present

## 2014-05-10 DIAGNOSIS — E8779 Other fluid overload: Secondary | ICD-10-CM | POA: Diagnosis not present

## 2014-05-10 DIAGNOSIS — N186 End stage renal disease: Secondary | ICD-10-CM | POA: Diagnosis not present

## 2014-05-10 DIAGNOSIS — D631 Anemia in chronic kidney disease: Secondary | ICD-10-CM | POA: Diagnosis not present

## 2014-05-12 DIAGNOSIS — E8779 Other fluid overload: Secondary | ICD-10-CM | POA: Diagnosis not present

## 2014-05-12 DIAGNOSIS — N186 End stage renal disease: Secondary | ICD-10-CM | POA: Diagnosis not present

## 2014-05-12 DIAGNOSIS — D631 Anemia in chronic kidney disease: Secondary | ICD-10-CM | POA: Diagnosis not present

## 2014-05-14 DIAGNOSIS — D631 Anemia in chronic kidney disease: Secondary | ICD-10-CM | POA: Diagnosis not present

## 2014-05-14 DIAGNOSIS — E8779 Other fluid overload: Secondary | ICD-10-CM | POA: Diagnosis not present

## 2014-05-14 DIAGNOSIS — N186 End stage renal disease: Secondary | ICD-10-CM | POA: Diagnosis not present

## 2014-05-17 DIAGNOSIS — E8779 Other fluid overload: Secondary | ICD-10-CM | POA: Diagnosis not present

## 2014-05-17 DIAGNOSIS — D631 Anemia in chronic kidney disease: Secondary | ICD-10-CM | POA: Diagnosis not present

## 2014-05-17 DIAGNOSIS — N186 End stage renal disease: Secondary | ICD-10-CM | POA: Diagnosis not present

## 2014-05-19 DIAGNOSIS — D631 Anemia in chronic kidney disease: Secondary | ICD-10-CM | POA: Diagnosis not present

## 2014-05-19 DIAGNOSIS — N186 End stage renal disease: Secondary | ICD-10-CM | POA: Diagnosis not present

## 2014-05-19 DIAGNOSIS — E8779 Other fluid overload: Secondary | ICD-10-CM | POA: Diagnosis not present

## 2014-05-21 DIAGNOSIS — N186 End stage renal disease: Secondary | ICD-10-CM | POA: Diagnosis not present

## 2014-05-21 DIAGNOSIS — D631 Anemia in chronic kidney disease: Secondary | ICD-10-CM | POA: Diagnosis not present

## 2014-05-21 DIAGNOSIS — E8779 Other fluid overload: Secondary | ICD-10-CM | POA: Diagnosis not present

## 2014-05-22 ENCOUNTER — Other Ambulatory Visit: Payer: Self-pay | Admitting: Internal Medicine

## 2014-05-22 DIAGNOSIS — N186 End stage renal disease: Secondary | ICD-10-CM | POA: Diagnosis not present

## 2014-05-22 DIAGNOSIS — T8612 Kidney transplant failure: Secondary | ICD-10-CM | POA: Diagnosis not present

## 2014-05-22 DIAGNOSIS — Z992 Dependence on renal dialysis: Secondary | ICD-10-CM | POA: Diagnosis not present

## 2014-05-24 DIAGNOSIS — D631 Anemia in chronic kidney disease: Secondary | ICD-10-CM | POA: Diagnosis not present

## 2014-05-24 DIAGNOSIS — N186 End stage renal disease: Secondary | ICD-10-CM | POA: Diagnosis not present

## 2014-05-25 ENCOUNTER — Ambulatory Visit (HOSPITAL_BASED_OUTPATIENT_CLINIC_OR_DEPARTMENT_OTHER): Payer: Medicare Other | Admitting: Hematology and Oncology

## 2014-05-25 ENCOUNTER — Other Ambulatory Visit: Payer: Self-pay | Admitting: *Deleted

## 2014-05-25 ENCOUNTER — Encounter: Payer: Self-pay | Admitting: Hematology and Oncology

## 2014-05-25 ENCOUNTER — Ambulatory Visit: Payer: Medicare Other

## 2014-05-25 DIAGNOSIS — D61818 Other pancytopenia: Secondary | ICD-10-CM | POA: Diagnosis not present

## 2014-05-25 DIAGNOSIS — N189 Chronic kidney disease, unspecified: Secondary | ICD-10-CM | POA: Diagnosis not present

## 2014-05-25 NOTE — Progress Notes (Signed)
[facility] CONSULT NOTE  Patient Care Team: Estill Dooms, MD as PCP - General (Internal Medicine)  CHIEF COMPLAINTS/PURPOSE OF CONSULTATION:  Pancytopenia  HISTORY OF PRESENTING ILLNESS:  Kirk [name] 54 y.o. male is here because of chronic pancytopenia. Patient was recently hospitalized with pneumonia and was treated with antibiotics. During the hospitalization his white count had declined thousand and platelet count 75,000 and hemoglobin of 9 g. At the time of discharge his blood counts had nearly normalized with the exception of hemoglobin. He was sent was for further evaluation of chronic pancytopenia because they are contemplating on another kidney transplantation and they would like to make sure that he does not have any other underlying malignancies. Patient had chronic kidney disease and had been on hemodialysis since 2012. 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 He has currently been on chronic hemodialysis using the fistula.  MEDICAL HISTORY:  Past Medical History  Diagnosis Date  . Anxiety   . Chronic headaches   . Chronic kidney disease (CKD)   . Dialysis care 02/06/2010  . HTN (hypertension)   . Sleep apnea     wears the CPAP  . Type II or unspecified type diabetes mellitus with renal manifestations, uncontrolled   . Shortness of breath   . Impotence of organic origin   . Other malaise and fatigue   . Debility, unspecified   . Cellulitis and abscess of trunk   . Kidney replaced by transplant   . Acute edema of lung, unspecified   . Unspecified constipation   . Other chronic nonalcoholic liver disease   . Localization-related (focal) (partial) epilepsy and epileptic syndromes with complex partial seizures, without mention of intractable epilepsy   . Tension headache   . Dermatophytosis of the body   . Gout, unspecified   . Pain in joint, upper arm   . Pain in joint, lower leg   . Other  nonspecific abnormal serum enzyme levels   . Essential hypertension, benign   . Acute, but ill-defined, cerebrovascular disease   . Memory loss   . Hypotension, unspecified   . Hypertrophy of prostate without urinary obstruction and other lower urinary tract symptoms (LUTS)   . Renal dialysis status(V45.11)   . End stage renal disease   . Insomnia, unspecified   . Secondary hyperparathyroidism (of renal origin)   . Lumbago   . Unspecified vitamin D deficiency   . Carpal tunnel syndrome   . 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)   . Unspecified hereditary and idiopathic peripheral neuropathy   . Other and unspecified hyperlipidemia   . Unspecified essential hypertension   . Arteriovenous fistula, acquired   . Edema   . Vasectomy status   . Obstructive sleep apnea (adult) (pediatric)     SURGICAL HISTORY: Past Surgical History  Procedure Laterality Date  . Inner ear surgery  1973    for deafness at age 58  . Colonoscopy    . Dialysis shunt placement    . Kidney transplant  08/17/2011    [facility]   . Placement of fistula at [facility] vein specialist    . Left heart catheterization with coronary angiogram N/A 03/21/2011    Procedure: LEFT HEART CATHETERIZATION WITH CORONARY ANGIOGRAM;  Surgeon: [name], MD;  Location: [facility] CATH LAB;  Service: Cardiovascular;  Laterality: N/A;  . Cervical disc surgery      for spinal stenosis  . Nephrectomy  removed transplaned kidney    SOCIAL HISTORY: History   Social History  . Marital Status: Married    Spouse Name: N/A  . Number of Children: 3  . Years of Education: N/A   Occupational History  . disabled    Social History Main Topics  . Smoking status: Former Smoker -- 28 years    Types: Cigarettes    Quit date: 08/04/2011  . Smokeless tobacco: Never Used     Comment: 1/2 pack every 4 days  . Alcohol Use: No  . Drug Use: No  . Sexual Activity: Yes   Other Topics Concern  . Not on file   Social History  Narrative    FAMILY HISTORY: Family History  Problem Relation Age of Onset  . Adopted: Yes  . Colon cancer Neg Hx   . Esophageal cancer Neg Hx   . Rectal cancer Neg Hx   . Stomach cancer Neg Hx     ALLERGIES:  is allergic to codeine.  MEDICATIONS:  Current Outpatient Prescriptions  Medication Sig Dispense Refill  . acetaminophen (TYLENOL) 500 MG tablet Take 500 mg by mouth every 4 (four) hours as needed for mild pain.     Marland Kitchen albuterol (PROVENTIL) (2.5 MG/3ML) 0.083% nebulizer solution Take 3 mLs (2.5 mg total) by nebulization every 6 (six) hours as needed for wheezing. 75 mL 12  . albuterol-ipratropium (COMBIVENT) 18-103 MCG/ACT inhaler Inhale 2 puffs into the lungs every 6 (six) hours as needed for wheezing. 14.7 g 3  . amLODipine (NORVASC) 10 MG tablet Take 1 tablet (10 mg total) by mouth daily. 90 tablet 1  . aspirin EC 81 MG tablet Take 81 mg by mouth daily.    Marland Kitchen b complex-vitamin c-folic acid (NEPHRO-VITE) 0.8 MG TABS tablet TAKE 1 TABLET BY MOUTH EVERY DAY 30 tablet 2  . carvedilol (COREG) 25 MG tablet Take 1 tablet (25 mg total) by mouth 2 (two) times daily. Take one tablet in the morning and one tablet in the evening 180 tablet 1  . Cholecalciferol 5000 UNITS TABS Take 5,000 Units by mouth every morning.    . diphenhydrAMINE (BENADRYL) 25 MG tablet Take 50 mg by mouth 2 (two) times daily.     Marland Kitchen doxazosin (CARDURA) 4 MG tablet Take 0.5 tablet  by mouth in the am and 0.5 tablet by mouth in the pm to regulate blood pressure. (Patient taking differently: Take 2 mg by mouth 2 (two) times daily. Take 0.5 tablet  by mouth in the am and 0.5 tablet by mouth in the pm to regulate blood pressure.) 90 tablet 1  . lanthanum (FOSRENOL) 1000 MG chewable tablet Chew 500-1,000 mg by mouth 4 (four) times daily. 1 tablet three times daily with each meal and 1/2 tablet with snack.    . ondansetron (ZOFRAN ODT) 4 MG disintegrating tablet Take 1 tablet (4 mg total) by mouth every 8 (eight) hours as  needed for nausea or vomiting. 20 tablet 0  . pravastatin (PRAVACHOL) 40 MG tablet Take 1 tablet (40 mg total) by mouth every evening. 90 tablet 1  . traMADol (ULTRAM) 50 MG tablet One up to 4 times daly if needed for pain (Patient taking differently: Take 50 mg by mouth 4 (four) times daily as needed for moderate pain. One up to 4 times daly if needed for pain) 100 tablet 3   No current facility-administered medications for this visit.    REVIEW OF SYSTEMS:   Constitutional: Denies fevers, chills or abnormal night sweats  Eyes: Denies blurriness of vision, double vision or watery eyes Ears, nose, mouth, throat, and face: Denies mucositis or sore throat Respiratory: Denies cough, dyspnea or wheezes Cardiovascular: Denies palpitation, chest discomfort or lower extremity swelling Gastrointestinal:  Denies nausea, heartburn or change in bowel habits Skin: Denies abnormal skin rashes Lymphatics: Denies new lymphadenopathy or easy bruising Neurological: Grade 1 peripheral neuropathy Behavioral/Psych: Mood is stable, no new changes   All other systems were reviewed with the patient and are negative.  PHYSICAL EXAMINATION: ECOG PERFORMANCE STATUS: 1 - Symptomatic but completely ambulatory  Filed Vitals:   05/25/14 1533  BP: 140/56  Pulse: 80  Temp: 98.2 F (36.8 C)  Resp: 20   Filed Weights   05/25/14 1533  Weight: 234 lb 3.2 oz (106.232 kg)    GENERAL:alert, no distress and comfortable SKIN: skin color, texture, turgor are normal, no rashes or significant lesions EYES: normal, conjunctiva are pink and non-injected, sclera clear OROPHARYNX:no exudate, no erythema and lips, buccal mucosa, and tongue normal  NECK: supple, thyroid normal size, non-tender, without nodularity LYMPH:  no palpable lymphadenopathy in the cervical, axillary or inguinal LUNGS: clear to auscultation and percussion with normal breathing effort HEART: regular rate & rhythm and no murmurs and no lower extremity  edema ABDOMEN:abdomen soft, non-tender and normal bowel sounds Musculoskeletal:no cyanosis of digits and no clubbing  PSYCH: alert & oriented x 3 with fluent speech NEURO: no focal motor/sensory deficits  LABORATORY DATA:  I have reviewed the data as listed Lab Results  Component Value Date   WBC 6.4 04/14/2014   HGB 10.3* 04/14/2014   HCT 30.1* 04/14/2014   MCV 97.4 04/14/2014   PLT 135* 04/14/2014   Lab Results  Component Value Date   NA 136 04/16/2014   K 4.5 04/16/2014   CL 98 04/16/2014   CO2 24 04/16/2014    ASSESSMENT AND PLAN:  1. Pancytopenia: Patient has long-standing chronic normocytic anemia due to chronic kidney disease as well as chronic thrombocytopenia at least going back to 6 years ago when his platelets were 98,000. During the recent hospitalization his platelets dropped to 75,000. They have finally recovered 135,000. He also had leukopenia while he was in the hospital which has also resolved at the time of discharge.  2. Differential diagnosis for chronic pancytopenia includes chronic systemic illnesses which the patient has had many as well as medications versus underlying bone marrow disorders like myelodysplastic syndrome.  Because the patient is a candidate for another kidney transplant, the transplant physicians requesting a bone marrow biopsy. We will set him up to undergo the bone marrow biopsy and rule out any underlying bone marrow pathology.  Return to clinic after the bone marrow biopsy to discuss the results.  3. Chronic kidney disease failed kidney transplantation: Patient is going to be on this for another transplantation and will need a bone marrow biopsy to clear him from hematological problems.  All questions were answered. The patient knows to call the clinic with any problems, questions or concerns.    Rulon Eisenmenger, MD 5:02 PM

## 2014-05-25 NOTE — Progress Notes (Signed)
Checked in new pt with no financial concerns at this time.  Pt has 2 insurance so financial assistance may not be needed but he has my card for any billing questions or concerns.

## 2014-05-26 ENCOUNTER — Telehealth: Payer: Self-pay | Admitting: Hematology and Oncology

## 2014-05-26 DIAGNOSIS — D631 Anemia in chronic kidney disease: Secondary | ICD-10-CM | POA: Diagnosis not present

## 2014-05-26 DIAGNOSIS — N186 End stage renal disease: Secondary | ICD-10-CM | POA: Diagnosis not present

## 2014-05-26 NOTE — Telephone Encounter (Signed)
Called and left a message with new follow up appointment

## 2014-05-28 DIAGNOSIS — D631 Anemia in chronic kidney disease: Secondary | ICD-10-CM | POA: Diagnosis not present

## 2014-05-28 DIAGNOSIS — N186 End stage renal disease: Secondary | ICD-10-CM | POA: Diagnosis not present

## 2014-05-31 DIAGNOSIS — D631 Anemia in chronic kidney disease: Secondary | ICD-10-CM | POA: Diagnosis not present

## 2014-05-31 DIAGNOSIS — N186 End stage renal disease: Secondary | ICD-10-CM | POA: Diagnosis not present

## 2014-06-01 ENCOUNTER — Ambulatory Visit: Payer: Medicare Other

## 2014-06-01 ENCOUNTER — Ambulatory Visit: Payer: Medicare Other | Admitting: Hematology and Oncology

## 2014-06-02 ENCOUNTER — Other Ambulatory Visit: Payer: Self-pay | Admitting: Radiology

## 2014-06-02 DIAGNOSIS — N186 End stage renal disease: Secondary | ICD-10-CM | POA: Diagnosis not present

## 2014-06-02 DIAGNOSIS — D631 Anemia in chronic kidney disease: Secondary | ICD-10-CM | POA: Diagnosis not present

## 2014-06-03 ENCOUNTER — Ambulatory Visit (HOSPITAL_COMMUNITY)
Admission: RE | Admit: 2014-06-03 | Discharge: 2014-06-03 | Disposition: A | Discharge status: Home / Self Care | Payer: Medicare Other | Source: Ambulatory Visit | Attending: Hematology and Oncology | Admitting: Hematology and Oncology

## 2014-06-03 ENCOUNTER — Encounter (HOSPITAL_COMMUNITY): Payer: Self-pay

## 2014-06-03 DIAGNOSIS — D696 Thrombocytopenia, unspecified: Secondary | ICD-10-CM | POA: Insufficient documentation

## 2014-06-03 DIAGNOSIS — D61818 Other pancytopenia: Secondary | ICD-10-CM | POA: Diagnosis not present

## 2014-06-03 DIAGNOSIS — D539 Nutritional anemia, unspecified: Secondary | ICD-10-CM | POA: Diagnosis not present

## 2014-06-03 DIAGNOSIS — D7589 Other specified diseases of blood and blood-forming organs: Secondary | ICD-10-CM | POA: Insufficient documentation

## 2014-06-03 LAB — CBC
HCT: 25.8 % — ABNORMAL LOW (ref 39.0–52.0)
HEMOGLOBIN: 8.5 g/dL — AB (ref 13.0–17.0)
MCH: 34.3 pg — AB (ref 26.0–34.0)
MCHC: 32.9 g/dL (ref 30.0–36.0)
MCV: 104 fL — ABNORMAL HIGH (ref 78.0–100.0)
Platelets: 130 10*3/uL — ABNORMAL LOW (ref 150–400)
RBC: 2.48 MIL/uL — AB (ref 4.22–5.81)
RDW: 15.7 % — ABNORMAL HIGH (ref 11.5–15.5)
WBC: 4.4 10*3/uL (ref 4.0–10.5)

## 2014-06-03 LAB — BASIC METABOLIC PANEL
Anion gap: 18 — ABNORMAL HIGH (ref 5–15)
BUN: 32 mg/dL — ABNORMAL HIGH (ref 6–20)
CALCIUM: 8.6 mg/dL — AB (ref 8.9–10.3)
CO2: 26 mmol/L (ref 22–32)
CREATININE: 10.04 mg/dL — AB (ref 0.61–1.24)
Chloride: 96 mmol/L — ABNORMAL LOW (ref 101–111)
GFR calc Af Amer: 6 mL/min — ABNORMAL LOW (ref >60)
GFR, EST NON AFRICAN AMERICAN: 5 mL/min — AB (ref >60)
GLUCOSE: 134 mg/dL — AB (ref 65–99)
Potassium: 3.7 mmol/L (ref 3.5–5.1)
SODIUM: 140 mmol/L (ref 135–145)

## 2014-06-03 LAB — PROTIME-INR
INR: 1 (ref 0.00–1.49)
PROTHROMBIN TIME: 13.3 s (ref 11.6–15.2)

## 2014-06-03 LAB — APTT: aPTT: 34 seconds (ref 24–37)

## 2014-06-03 LAB — BONE MARROW EXAM

## 2014-06-03 MED ORDER — MIDAZOLAM HCL 2 MG/2ML IJ SOLN
INTRAMUSCULAR | Status: AC
  Filled 2014-06-03: qty 4

## 2014-06-03 MED ORDER — MIDAZOLAM HCL 2 MG/2ML IJ SOLN
INTRAMUSCULAR | Status: AC | PRN
  Administered 2014-06-03 (×2): 0.5 mg via INTRAVENOUS
  Administered 2014-06-03: 1 mg via INTRAVENOUS

## 2014-06-03 MED ORDER — FENTANYL CITRATE (PF) 100 MCG/2ML IJ SOLN
INTRAMUSCULAR | Status: AC
  Filled 2014-06-03: qty 2

## 2014-06-03 MED ORDER — SODIUM CHLORIDE 0.9 % IV SOLN
INTRAVENOUS | Status: DC
  Administered 2014-06-03: 500 mL via INTRAVENOUS

## 2014-06-03 MED ORDER — FENTANYL CITRATE (PF) 100 MCG/2ML IJ SOLN
INTRAMUSCULAR | Status: AC | PRN
  Administered 2014-06-03: 25 ug via INTRAVENOUS
  Administered 2014-06-03: 50 ug via INTRAVENOUS
  Administered 2014-06-03: 25 ug via INTRAVENOUS

## 2014-06-03 NOTE — Procedures (Signed)
BM asp/Bx No comp

## 2014-06-03 NOTE — H&P (Signed)
Kirk [name] is an 54 y.o. male.   Chief Complaint: Pancytopenia HPI: Kirk [name] is a pleasant 54 yo male with longstanding h/o ESRD.  He has h/o failed renal transplant.  He is preparing for a second transplant. He was recently hospitalized for pneumonia and was found to have pancytopenia. We are requested to do a bone marrow biopsy to evaluate for neoplastic process prior to renal transplant.  Past Medical History  Diagnosis Date  . Anxiety   . Chronic headaches   . Chronic kidney disease (CKD)   . Dialysis care 02/06/2010  . HTN (hypertension)   . Sleep apnea     wears the CPAP  . Type II or unspecified type diabetes mellitus with renal manifestations, uncontrolled   . Shortness of breath   . Impotence of organic origin   . Other malaise and fatigue   . Debility, unspecified   . Cellulitis and abscess of trunk   . Kidney replaced by transplant   . Acute edema of lung, unspecified   . Unspecified constipation   . Other chronic nonalcoholic liver disease   . Localization-related (focal) (partial) epilepsy and epileptic syndromes with complex partial seizures, without mention of intractable epilepsy   . Tension headache   . Dermatophytosis of the body   . Gout, unspecified   . Pain in joint, upper arm   . Pain in joint, lower leg   . Other nonspecific abnormal serum enzyme levels   . Essential hypertension, benign   . Acute, but ill-defined, cerebrovascular disease   . Memory loss   . Hypotension, unspecified   . Hypertrophy of prostate without urinary obstruction and other lower urinary tract symptoms (LUTS)   . Renal dialysis status(V45.11) 02/05/2010    restarted 01/02/13 ofter renal trransplant failure  . End stage renal disease   . Insomnia, unspecified   . Secondary hyperparathyroidism (of renal origin)   . Lumbago   . Unspecified vitamin D deficiency   . Carpal tunnel syndrome   . 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)   . Unspecified hereditary and idiopathic  peripheral neuropathy   . Other and unspecified hyperlipidemia   . Unspecified essential hypertension   . Arteriovenous fistula, acquired   . Edema   . Vasectomy status   . Obstructive sleep apnea (adult) (pediatric)     CPAP    Past Surgical History  Procedure Laterality Date  . Inner ear surgery  1973    for deafness at age 89  . Colonoscopy    . Dialysis shunt placement    . Kidney transplant  08/17/2011    [facility]   . Placement of fistula at [facility] vein specialist    . Left heart catheterization with coronary angiogram N/A 03/21/2011    Procedure: LEFT HEART CATHETERIZATION WITH CORONARY ANGIOGRAM;  Surgeon: [name], MD;  Location: [facility] CATH LAB;  Service: Cardiovascular;  Laterality: N/A;  . Cervical disc surgery      for spinal stenosis  . Nephrectomy      removed transplaned kidney    Family History  Problem Relation Age of Onset  . Adopted: Yes  . Colon cancer Neg Hx   . Esophageal cancer Neg Hx   . Rectal cancer Neg Hx   . Stomach cancer Neg Hx    Social History:  reports that he quit smoking about 2 years ago. His smoking use included Cigarettes. He quit after 28 years of use. He has never used smokeless tobacco. He reports that  he does not drink alcohol or use illicit drugs.  Allergies:  Allergies  Allergen Reactions  . Codeine Nausea And Vomiting and Nausea Only     (Not in a hospital admission)  Results for orders placed or performed during the hospital encounter of 06/03/14 (from the past 48 hour(s))  APTT upon arrival     Status: None   Collection Time: 06/03/14  9:30 AM  Result Value Ref Range   aPTT 34 24 - 37 seconds  CBC upon arrival     Status: Abnormal   Collection Time: 06/03/14  9:30 AM  Result Value Ref Range   WBC 4.4 4.0 - 10.5 K/uL   RBC 2.48 (L) 4.22 - 5.81 MIL/uL   Hemoglobin 8.5 (L) 13.0 - 17.0 g/dL   HCT 25.8 (L) 39.0 - 52.0 %   MCV 104.0 (H) 78.0 - 100.0 fL   MCH 34.3 (H) 26.0 - 34.0 pg   MCHC 32.9 30.0 - 36.0  g/dL   RDW 15.7 (H) 11.5 - 15.5 %   Platelets 130 (L) 150 - 400 K/uL  Protime-INR upon arrival     Status: None   Collection Time: 06/03/14  9:30 AM  Result Value Ref Range   Prothrombin Time 13.3 11.6 - 15.2 seconds   INR 1.00 0.00 - 1.63  Basic metabolic panel     Status: Abnormal   Collection Time: 06/03/14  9:30 AM  Result Value Ref Range   Sodium 140 135 - 145 mmol/L   Potassium 3.7 3.5 - 5.1 mmol/L   Chloride 96 (L) 101 - 111 mmol/L   CO2 26 22 - 32 mmol/L   Glucose, Bld 134 (H) 65 - 99 mg/dL   BUN 32 (H) 6 - 20 mg/dL   Creatinine, Ser 10.04 (H) 0.61 - 1.24 mg/dL   Calcium 8.6 (L) 8.9 - 10.3 mg/dL   GFR calc non Af Amer 5 (L) >60 mL/min   GFR calc Af Amer 6 (L) >60 mL/min    Comment: (NOTE) The eGFR has been calculated using the CKD EPI equation. This calculation has not been validated in all clinical situations. eGFR's persistently <60 mL/min signify possible Chronic Kidney Disease.    Anion gap 18 (H) 5 - 15   No results found.  Review of Systems  Constitutional: Positive for malaise/fatigue. Negative for fever, chills and weight loss.  Respiratory: Positive for wheezing. Negative for cough.   Cardiovascular: Negative for chest pain.  Gastrointestinal: Negative for heartburn, nausea, vomiting and abdominal pain.  Musculoskeletal: Negative for myalgias.  Neurological: Negative for dizziness and headaches.    Blood pressure 125/68, pulse 80, temperature 98.4 F (36.9 C), temperature source Oral, resp. rate 20, height 5' 7" (1.702 m), weight 232 lb (105.235 kg), SpO2 98 %. Physical Exam  Constitutional: He is oriented to person, place, and time. He appears well-developed and well-nourished.  HENT:  Head: Normocephalic and atraumatic.  Eyes: EOM are normal.  Neck: Normal range of motion. Neck supple.  Cardiovascular: Normal rate and regular rhythm.   Respiratory: Effort normal. He has wheezes.  GI: Soft. Bowel sounds are normal. He exhibits no distension. There  is no tenderness.  Musculoskeletal: Normal range of motion.  Neurological: He is alert and oriented to person, place, and time. No cranial nerve deficit.  Skin: Skin is warm and dry.  Psychiatric: He has a normal mood and affect. His behavior is normal. Judgment and thought content normal.     Assessment/Plan Pancytopenia ESRD on dialysis Will proceed  with Bone Marrow Biopsy today by Dr. Hoss.  Risks and Benefits discussed with the patient including, but not limited to bleeding, infection, damage to adjacent structures or low yield requiring additional tests. All of the patient's questions were answered, patient is agreeable to proceed. Consent signed and in chart.   ,  R 06/03/2014, 9:56 AM    

## 2014-06-03 NOTE — Discharge Instructions (Signed)
Conscious Sedation °Sedation is the use of medicines to promote relaxation and relieve discomfort and anxiety. Conscious sedation is a type of sedation. Under conscious sedation you are less alert than normal but are still able to respond to instructions or stimulation. Conscious sedation is used during short medical and dental procedures. It is milder than deep sedation or general anesthesia and allows you to return to your regular activities sooner.  °LET YOUR HEALTH CARE PROVIDER KNOW ABOUT:  °· Any allergies you have. °· All medicines you are taking, including vitamins, herbs, eye drops, creams, and over-the-counter medicines. °· Use of steroids (by mouth or creams). °· Previous problems you or members of your family have had with the use of anesthetics. °· Any blood disorders you have. °· Previous surgeries you have had. °· Medical conditions you have. °· Possibility of pregnancy, if this applies. °· Use of cigarettes, alcohol, or illegal drugs. °RISKS AND COMPLICATIONS °Generally, this is a safe procedure. However, as with any procedure, problems can occur. Possible problems include: °· Oversedation. °· Trouble breathing on your own. You may need to have a breathing tube until you are awake and breathing on your own. °· Allergic reaction to any of the medicines used for the procedure. °BEFORE THE PROCEDURE °· You may have blood tests done. These tests can help show how well your kidneys and liver are working. They can also show how well your blood clots. °· A physical exam will be done.   °· Only take medicines as directed by your health care provider. You may need to stop taking medicines (such as blood thinners, aspirin, or nonsteroidal anti-inflammatory drugs) before the procedure.   °· Do not eat or drink at least 6 hours before the procedure or as directed by your health care provider. °· Arrange for a responsible adult, family member, or friend to take you home after the procedure. He or she should stay  with you for at least 24 hours after the procedure, until the medicine has worn off. °PROCEDURE  °· An intravenous (IV) catheter will be inserted into one of your veins. Medicine will be able to flow directly into your body through this catheter. You may be given medicine through this tube to help prevent pain and help you relax. °· The medical or dental procedure will be done. °AFTER THE PROCEDURE °· You will stay in a recovery area until the medicine has worn off. Your blood pressure and pulse will be checked.   °·  Depending on the procedure you had, you may be allowed to go home when you can tolerate liquids and your pain is under control. °Document Released: 10/03/2000 Document Revised: 01/13/2013 Document Reviewed: 09/15/2012 °ExitCare® Patient Information ©2015 [employer]. This information is not intended to replace advice given to you by your health care provider. Make sure you discuss any questions you have with your health care provider. °Bone Marrow Aspiration and Bone Biopsy °Examination of the bone marrow is a valuable test to diagnose blood disorders. A bone marrow biopsy takes a sample of bone and a small amount of fluid and cells from inside the bone. A bone marrow aspiration removes only the marrow. Bone marrow aspiration and bone biopsies are used to stage different disorders of the blood, such as leukemia. Staging will help your caregiver understand how far the disease has progressed.  °The tests are also useful in diagnosing: °· Fever of unknown origin (FUO). °· Bacterial infections and other widespread fungal infections. °· Cancers that have spread (metastasized) to the bone   marrow. °· Diseases that are characterized by a deficiency of an enzyme (storage diseases). This includes: °· Niemann-Pick disease. °· Gaucher disease. °PROCEDURE  °Sites used to get samples include:  °· Back of your hip bone (posterior iliac crest). °· Both aspiration and biopsy. °· Front of your hip bone (anterior iliac  crest). °· Both aspiration and biopsy. °· Breastbone (sternum). °· Aspiration from your breastbone (done only in adults). This method is rarely used. °When you get a hip bone aspiration: °· You are placed lying on your side with the upper knee brought up and flexed with the lower leg straight. °· The site is prepared, cleaned with an antiseptic scrub, and draped. This keeps the biopsy area clean. °· The skin and the area down to the lining of the bone (periosteum) are made numb with a local anesthetic. °· The bone marrow aspiration needle is inserted. You will feel pressure on your bone. °· Once inside the marrow cavity, a sample of bone marrow is sucked out (aspirated) for pathology slides. °· The material collected for bone marrow slides is processed immediately by a technologist. °· The technician selects the marrow particles to make the slides for pathology. °· The marrow aspiration needle is removed. Then pressure is applied to the site with gauze until bleeding has stopped. °Following an aspiration, a bone marrow biopsy may be performed as well. The technique for this is very similar. A dressing is then applied.  °RISKS AND COMPLICATIONS °· The main complications of a bone marrow aspiration and biopsy include infection and bleeding. °· Complications are uncommon. The procedure may not be performed in patients with bleeding tendencies. °· A very rare complication from the procedure is injury to the heart during a breastbone (sternal) marrow aspiration. Only bone marrow aspirations are performed in this area. °· Long-lasting pain at the site of the bone marrow aspiration and biopsy is uncommon. °Your caregiver will let you know when you are to get your results and will discuss them with you. You may make an appointment with your caregiver to find out the results. Do not assume everything is normal if you have not heard from your caregiver or the medical facility. It is important for you to follow up on all of  your test results. °Document Released: 01/12/2004 Document Revised: 04/02/2011 Document Reviewed: 01/06/2008 °ExitCare® Patient Information ©2015 [employer]. This information is not intended to replace advice given to you by your health care provider. Make sure you discuss any questions you have with your health care provider. ° °

## 2014-06-04 DIAGNOSIS — N186 End stage renal disease: Secondary | ICD-10-CM | POA: Diagnosis not present

## 2014-06-04 DIAGNOSIS — D631 Anemia in chronic kidney disease: Secondary | ICD-10-CM | POA: Diagnosis not present

## 2014-06-07 ENCOUNTER — Telehealth: Payer: Self-pay | Admitting: *Deleted

## 2014-06-07 DIAGNOSIS — D631 Anemia in chronic kidney disease: Secondary | ICD-10-CM | POA: Diagnosis not present

## 2014-06-07 DIAGNOSIS — N186 End stage renal disease: Secondary | ICD-10-CM | POA: Diagnosis not present

## 2014-06-07 NOTE — Telephone Encounter (Signed)
He has an appointment in our lab 06/10/2014. This is for a CBC, hemoglobin A1c, lipid panel, and CMP. He also has a visitation with his hematologist on 06/10/2014. His next appointment with me is 06/16/2014. Please see the appointment schedule for confirmation of all of the above.

## 2014-06-07 NOTE — Telephone Encounter (Signed)
Patient's wife called to see if Kirk [name] needed labs drawn before his Thursday appointment, she stated that he had a biopsy on last Thursday. Please Advise!

## 2014-06-08 ENCOUNTER — Ambulatory Visit: Payer: Medicare Other | Admitting: Hematology

## 2014-06-08 ENCOUNTER — Ambulatory Visit: Payer: Medicare Other

## 2014-06-09 DIAGNOSIS — N186 End stage renal disease: Secondary | ICD-10-CM | POA: Diagnosis not present

## 2014-06-09 DIAGNOSIS — D631 Anemia in chronic kidney disease: Secondary | ICD-10-CM | POA: Diagnosis not present

## 2014-06-10 ENCOUNTER — Other Ambulatory Visit: Payer: Medicare Other

## 2014-06-10 ENCOUNTER — Ambulatory Visit (HOSPITAL_BASED_OUTPATIENT_CLINIC_OR_DEPARTMENT_OTHER): Payer: Medicare Other | Admitting: Hematology and Oncology

## 2014-06-10 VITALS — BP 141/61 | HR 81 | Temp 98.3°F | Resp 18 | Ht 67.0 in | Wt 236.0 lb

## 2014-06-10 DIAGNOSIS — D61818 Other pancytopenia: Secondary | ICD-10-CM | POA: Diagnosis not present

## 2014-06-10 DIAGNOSIS — D631 Anemia in chronic kidney disease: Secondary | ICD-10-CM | POA: Diagnosis not present

## 2014-06-10 DIAGNOSIS — N189 Chronic kidney disease, unspecified: Secondary | ICD-10-CM

## 2014-06-10 NOTE — Assessment & Plan Note (Signed)
Pancytopenia: Patient has long-standing chronic normocytic anemia due to chronic kidney disease as well as chronic thrombocytopenia at least going back to 2000 when his platelets were 98,000, leukopenia started during hospitalization March 2016  Bone marrow biopsy: 06/03/2014: Mildly hypocellular (20-30%) marrow with erythroid hyperplasia and dysplasia, suggestive of erythropoietin deficiency. No findings suggestive of myelodysplastic syndrome. Cytogenetics pending.  Pathology review: I discussed with the patient the results of the bone marrow biopsy which did not show any evidence of myelodysplasia or underlying bone marrow disorders other than finding suggestive of erythroid hyperplasia which suggest underlying erythropoietin deficiency done intrinsic bone marrow pathology. I will call the patient with the results of cytogenetics when the become available.  Treatment plan: 1. Check U-71 and folic acid 2. Recommend erythropoietin supplementation for treatment of anemia  Patient can undergo kidney transplant evaluation and does not have any bone marrow disorders based on the recent bone marrow biopsy results.

## 2014-06-10 NOTE — Progress Notes (Signed)
Patient Care Team: Estill Dooms, MD as PCP - General (Internal Medicine)  DIAGNOSIS: Macrocytic anemia, bone marrow biopsy 06/03/2014 showed hypocellular bone marrow with erythroid hyperplasia, no evidence of MDS  CHIEF COMPLIANT: Follow-up to discuss bone marrow biopsy  INTERVAL HISTORY: Kirk [name] is a 54 year old with above-mentioned history of macrocytic anemia and slight pancytopenia although his white count had normalized and chronic mild thrombocytopenia who underwent a bone marrow biopsy and is here today to discuss the pathology report. He is otherwise doing well without any other symptoms or concerns.  REVIEW OF SYSTEMS:   Constitutional: Denies fevers, chills or abnormal weight loss, fatigue present Eyes: Denies blurriness of vision Ears, nose, mouth, throat, and face: Denies mucositis or sore throat Respiratory: Denies cough, dyspnea or wheezes Cardiovascular: Denies palpitation, chest discomfort or lower extremity swelling Gastrointestinal:  Denies nausea, heartburn or change in bowel habits Skin: Denies abnormal skin rashes Lymphatics: Denies new lymphadenopathy or easy bruising Neurological:Denies numbness, tingling or new weaknesses Behavioral/Psych: Mood is stable, no new changes  All other systems were reviewed with the patient and are negative.  I have reviewed the past medical history, past surgical history, social history and family history with the patient and they are unchanged from previous note.  ALLERGIES:  is allergic to codeine.  MEDICATIONS:  Current Outpatient Prescriptions  Medication Sig Dispense Refill  . albuterol (PROVENTIL) (2.5 MG/3ML) 0.083% nebulizer solution Take 3 mLs (2.5 mg total) by nebulization every 6 (six) hours as needed for wheezing. 75 mL 12  . albuterol-ipratropium (COMBIVENT) 18-103 MCG/ACT inhaler Inhale 2 puffs into the lungs every 6 (six) hours as needed for wheezing. 14.7 g 3  . amLODipine (NORVASC) 10 MG tablet Take 1  tablet (10 mg total) by mouth daily. 90 tablet 1  . aspirin EC 81 MG tablet Take 81 mg by mouth daily.    Marland Kitchen b complex-vitamin c-folic acid (NEPHRO-VITE) 0.8 MG TABS tablet TAKE 1 TABLET BY MOUTH EVERY DAY 30 tablet 2  . carvedilol (COREG) 25 MG tablet Take 1 tablet (25 mg total) by mouth 2 (two) times daily. Take one tablet in the morning and one tablet in the evening 180 tablet 1  . Cholecalciferol 5000 UNITS TABS Take 5,000 Units by mouth every morning.    . diphenhydrAMINE (BENADRYL) 25 MG tablet Take 50 mg by mouth 2 (two) times daily.     Marland Kitchen doxazosin (CARDURA) 4 MG tablet Take 0.5 tablet  by mouth in the am and 0.5 tablet by mouth in the pm to regulate blood pressure. (Patient taking differently: Take 2 mg by mouth 2 (two) times daily. Take 0.5 tablet  by mouth in the am and 0.5 tablet by mouth in the pm to regulate blood pressure.) 90 tablet 1  . ondansetron (ZOFRAN ODT) 4 MG disintegrating tablet Take 1 tablet (4 mg total) by mouth every 8 (eight) hours as needed for nausea or vomiting. 20 tablet 0  . pravastatin (PRAVACHOL) 40 MG tablet Take 1 tablet (40 mg total) by mouth every evening. 90 tablet 1  . traMADol (ULTRAM) 50 MG tablet One up to 4 times daly if needed for pain (Patient taking differently: Take 50 mg by mouth 4 (four) times daily as needed for moderate pain. One up to 4 times daly if needed for pain) 100 tablet 3   No current facility-administered medications for this visit.    PHYSICAL EXAMINATION: ECOG PERFORMANCE STATUS: 1 - Symptomatic but completely ambulatory  Filed Vitals:   06/10/14 1510  BP: 141/61  Pulse: 81  Temp: 98.3 F (36.8 C)  Resp: 18   Filed Weights   06/10/14 1510  Weight: 236 lb (107.049 kg)    GENERAL:alert, no distress and comfortable SKIN: skin color, texture, turgor are normal, no rashes or significant lesions EYES: normal, Conjunctiva are pink and non-injected, sclera clear OROPHARYNX:no exudate, no erythema and lips, buccal mucosa, and  tongue normal  NECK: supple, thyroid normal size, non-tender, without nodularity LYMPH:  no palpable lymphadenopathy in the cervical, axillary or inguinal LUNGS: clear to auscultation and percussion with normal breathing effort HEART: regular rate & rhythm and no murmurs and no lower extremity edema ABDOMEN:abdomen soft, non-tender and normal bowel sounds Musculoskeletal:no cyanosis of digits and no clubbing  NEURO: alert & oriented x 3 with fluent speech, no focal motor/sensory deficits  LABORATORY DATA:  I have reviewed the data as listed   Chemistry      Component Value Date/Time   NA 140 06/03/2014 0930   NA 136 11/26/2013 0914   K 3.7 06/03/2014 0930   CL 96* 06/03/2014 0930   CO2 26 06/03/2014 0930   BUN 32* 06/03/2014 0930   BUN 38* 11/26/2013 0914   CREATININE 10.04* 06/03/2014 0930      Component Value Date/Time   CALCIUM 8.6* 06/03/2014 0930   CALCIUM 7.9* 01/19/2010 1016   ALKPHOS 43 04/11/2014 0643   AST 23 04/11/2014 0643   ALT 24 04/11/2014 0643   BILITOT 1.0 04/11/2014 0643       Lab Results  Component Value Date   WBC 4.4 06/03/2014   HGB 8.5* 06/03/2014   HCT 25.8* 06/03/2014   MCV 104.0* 06/03/2014   PLT 130* 06/03/2014   NEUTROABS 5.0 04/06/2014    ASSESSMENT & PLAN:  Pancytopenia Pancytopenia: Patient has long-standing chronic normocytic anemia due to chronic kidney disease as well as chronic thrombocytopenia at least going back to 2000 when his platelets were 98,000, leukopenia started during hospitalization March 2016  Bone marrow biopsy: 06/03/2014: Mildly hypocellular (20-30%) marrow with erythroid hyperplasia and dysplasia, suggestive of erythropoietin therapy. No findings suggestive of myelodysplastic syndrome. Cytogenetics pending.  Pathology review: I discussed with the patient the results of the bone marrow biopsy which did not show any evidence of myelodysplasia or underlying bone marrow disorders other than finding suggestive of  erythroid hyperplasia which suggest underlying erythropoietin deficiency done intrinsic bone marrow pathology. I will call the patient with the results of cytogenetics when the become available.  Treatment plan: Recommend erythropoietin supplementation for treatment of anemia Might consider retesting for P-53 and folic acid deficiencies in the future  Patient can undergo kidney transplant evaluation  I will call the patient with the result of cytogenetics. Return to clinic as needed No orders of the defined types were placed in this encounter.   The patient has a good understanding of the overall plan. he agrees with it. he will call with any problems that may develop before the next visit here.   Rulon Eisenmenger, MD

## 2014-06-11 DIAGNOSIS — N186 End stage renal disease: Secondary | ICD-10-CM | POA: Diagnosis not present

## 2014-06-11 DIAGNOSIS — D631 Anemia in chronic kidney disease: Secondary | ICD-10-CM | POA: Diagnosis not present

## 2014-06-14 DIAGNOSIS — N186 End stage renal disease: Secondary | ICD-10-CM | POA: Diagnosis not present

## 2014-06-14 DIAGNOSIS — D631 Anemia in chronic kidney disease: Secondary | ICD-10-CM | POA: Diagnosis not present

## 2014-06-14 LAB — CHROMOSOME ANALYSIS, BONE MARROW

## 2014-06-15 ENCOUNTER — Encounter (HOSPITAL_COMMUNITY): Payer: Self-pay

## 2014-06-16 ENCOUNTER — Ambulatory Visit (INDEPENDENT_AMBULATORY_CARE_PROVIDER_SITE_OTHER): Payer: Medicare Other | Admitting: Internal Medicine

## 2014-06-16 ENCOUNTER — Encounter: Payer: Self-pay | Admitting: Internal Medicine

## 2014-06-16 VITALS — BP 130/68 | HR 84 | Temp 98.3°F | Resp 18 | Ht 67.0 in | Wt 227.4 lb

## 2014-06-16 DIAGNOSIS — E1121 Type 2 diabetes mellitus with diabetic nephropathy: Secondary | ICD-10-CM | POA: Diagnosis not present

## 2014-06-16 DIAGNOSIS — N189 Chronic kidney disease, unspecified: Secondary | ICD-10-CM

## 2014-06-16 DIAGNOSIS — D631 Anemia in chronic kidney disease: Secondary | ICD-10-CM | POA: Diagnosis not present

## 2014-06-16 DIAGNOSIS — I151 Hypertension secondary to other renal disorders: Secondary | ICD-10-CM

## 2014-06-16 DIAGNOSIS — G44209 Tension-type headache, unspecified, not intractable: Secondary | ICD-10-CM

## 2014-06-16 DIAGNOSIS — N186 End stage renal disease: Secondary | ICD-10-CM

## 2014-06-16 DIAGNOSIS — Z992 Dependence on renal dialysis: Secondary | ICD-10-CM

## 2014-06-16 DIAGNOSIS — N2889 Other specified disorders of kidney and ureter: Secondary | ICD-10-CM

## 2014-06-16 DIAGNOSIS — R110 Nausea: Secondary | ICD-10-CM

## 2014-06-16 DIAGNOSIS — H9211 Otorrhea, right ear: Secondary | ICD-10-CM | POA: Diagnosis not present

## 2014-06-16 MED ORDER — ONDANSETRON 4 MG PO TBDP: ORAL_TABLET | ORAL | Status: DC

## 2014-06-16 MED ORDER — TRAMADOL HCL 50 MG PO TABS: ORAL_TABLET | ORAL | Status: DC

## 2014-06-16 MED ORDER — NEOMYCIN-POLYMYXIN-HC 3.5-10000-1 OT SUSP: OTIC | Status: DC

## 2014-06-16 NOTE — Progress Notes (Signed)
Patient ID: Kirk [name], male   DOB: [birth year], 54 y.o.   MRN: [id number]    Facility  [facility]    Place of Service:   OFFICE    Allergies  Allergen Reactions  . Codeine Nausea And Vomiting and Nausea Only    Chief Complaint  Patient presents with  . Medical Management of Chronic Issues    3 month follow-up, Discuss labs ( copy printed )    HPI:  Anemia in chronic renal disease: Hematology is following. Bone marrow biopsy on 06/03/14. Plan is to increase Procrit injections.  Type 2 diabetes mellitus with diabetic nephropathy: Stable  Hypertension secondary to other renal disorders: controlled.  End-stage renal disease on hemodialysis: Drowsy, completed dialysis today prior to visit. 3L off.  Ear drainage right: Ongoing for months, reports was treated for an ear infection with antibiotics, drainage persists. Notes drainage on pillow in morning from R ear, color varies from pink, to yellow/brown. Hears crackling in ear occasionally. Denies pain, fevers, cough.    Medications: Patient's Medications  New Prescriptions   No medications on file  Previous Medications   ALBUTEROL (PROVENTIL) (2.5 MG/3ML) 0.083% NEBULIZER SOLUTION    Take 3 mLs (2.5 mg total) by nebulization every 6 (six) hours as needed for wheezing.   ALBUTEROL-IPRATROPIUM (COMBIVENT) 18-103 MCG/ACT INHALER    Inhale 2 puffs into the lungs every 6 (six) hours as needed for wheezing.   AMLODIPINE (NORVASC) 10 MG TABLET    Take 1 tablet (10 mg total) by mouth daily.   ASPIRIN EC 81 MG TABLET    Take 81 mg by mouth daily.   B COMPLEX-VITAMIN C-FOLIC ACID (NEPHRO-VITE) 0.8 MG TABS TABLET    TAKE 1 TABLET BY MOUTH EVERY DAY   CARVEDILOL (COREG) 25 MG TABLET    Take 1 tablet (25 mg total) by mouth 2 (two) times daily. Take one tablet in the morning and one tablet in the evening   CHOLECALCIFEROL 5000 UNITS TABS    Take 5,000 Units by mouth every morning.   DIPHENHYDRAMINE (BENADRYL) 25 MG TABLET    Take 50 mg by mouth 2  (two) times daily.    DOXAZOSIN (CARDURA) 4 MG TABLET    Take 0.5 tablet  by mouth in the am and 0.5 tablet by mouth in the pm to regulate blood pressure.   ONDANSETRON (ZOFRAN ODT) 4 MG DISINTEGRATING TABLET    Take 1 tablet (4 mg total) by mouth every 8 (eight) hours as needed for nausea or vomiting.   PRAVASTATIN (PRAVACHOL) 40 MG TABLET    Take 1 tablet (40 mg total) by mouth every evening.   TRAMADOL (ULTRAM) 50 MG TABLET    One up to 4 times daly if needed for pain  Modified Medications   No medications on file  Discontinued Medications   No medications on file     Review of Systems  Constitutional: Negative for fever.       Chronically fatigued.  HENT: Positive for ear discharge and hearing loss. Negative for ear pain and tinnitus.   Eyes: Negative.   Respiratory: Negative for shortness of breath.        Dyspnea on exertion. Pneumonia. Hospitalized 04/06/14.  Cardiovascular: Negative for chest pain, palpitations and leg swelling.  Gastrointestinal: Negative for abdominal pain and abdominal distention.  Endocrine:       Diabetes is controlled.  Genitourinary: Negative.        Renal failure and kidney transplant.  Musculoskeletal:  Neck discomfort. Chronic right knee discomfort. Chronic back pains. Unsteady gait.  Skin:       Rash right lower quadrant of the abdomen.  Neurological: Positive for dizziness.       Memory is worse. Feet feel numb. Recurrent headaches  Hematological:       Anemia of CKD.  Psychiatric/Behavioral: Positive for confusion and decreased concentration.       Insomnia. Falls asleep in the day.    Filed Vitals:   06/16/14 1557  BP: 130/68  Pulse: 84  Temp: 98.3 F (36.8 C)  TempSrc: Oral  Resp: 18  Height: $Remove'5\' 7"'[id number]$  (1.702 m)  Weight: 227 lb 6.4 oz (103.148 kg)  SpO2: 98%   Body mass index is 35.61 kg/(m^2).  Physical Exam  Constitutional: He is oriented to person, place, and time.  Obese.  HENT:  Loss of hearing.  Eyes:  Corrective  lenses  Neck: No JVD present. No tracheal deviation present. No thyromegaly present.  Cardiovascular: Exam reveals no gallop.   No murmur heard. AF. 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  Abdominal: Soft. Bowel sounds are normal. He exhibits no distension and no mass. There is no tenderness.  Musculoskeletal: Normal range of motion. He exhibits tenderness. He exhibits no edema.  Scar left knee. Tender right knee  Lymphadenopathy:    He has no cervical adenopathy.  Neurological: He is alert and oriented to person, place, and time. No cranial nerve deficit. Coordination normal.  Positive Tinel sign bilaterally  Skin: No rash noted. No erythema. No pallor.  Psychiatric:  Drowsy during the exam.     Labs reviewed: Hospital Outpatient Visit on 06/03/2014  Component Date Value Ref Range Status  . aPTT 06/03/2014 34  24 - 37 seconds Final  . WBC 06/03/2014 4.4  4.0 - 10.5 K/uL Final  . RBC 06/03/2014 2.48* 4.22 - 5.81 MIL/uL Final  . Hemoglobin 06/03/2014 8.5* 13.0 - 17.0 g/dL Final  . HCT 06/03/2014 25.8* 39.0 - 52.0 % Final  . MCV 06/03/2014 104.0* 78.0 - 100.0 fL Final  . MCH 06/03/2014 34.3* 26.0 - 34.0 pg Final  . MCHC 06/03/2014 32.9  30.0 - 36.0 g/dL Final  . RDW 06/03/2014 15.7* 11.5 - 15.5 % Final  . Platelets 06/03/2014 130* 150 - 400 K/uL Final  . Prothrombin Time 06/03/2014 13.3  11.6 - 15.2 seconds Final  . INR 06/03/2014 1.00  0.00 - 1.49 Final  . Sodium 06/03/2014 140  135 - 145 mmol/L Final  . Potassium 06/03/2014 3.7  3.5 - 5.1 mmol/L Final  . Chloride 06/03/2014 96* 101 - 111 mmol/L Final  . CO2 06/03/2014 26  22 - 32 mmol/L Final  . Glucose, Bld 06/03/2014 134* 65 - 99 mg/dL Final  . BUN 06/03/2014 32* 6 - 20 mg/dL Final  . Creatinine, Ser 06/03/2014 10.04* 0.61 - 1.24 mg/dL Final  . Calcium 06/03/2014 8.6* 8.9 - 10.3 mg/dL Final  . GFR calc non Af Amer 06/03/2014 5* >60 mL/min Final  . GFR calc Af Amer 06/03/2014 6* >60 mL/min Final   Comment: (NOTE) The eGFR has been  calculated using the CKD EPI equation. This calculation has not been validated in all clinical situations. eGFR's persistently <60 mL/min signify possible Chronic Kidney Disease.   . Anion gap 06/03/2014 18* 5 - 15 Final  . Bone Marrow Exam 06/03/2014 SEE PATHOLOGY REPORT [id number]   Final  . Chromosome-Routine 06/03/2014 SEE SEPARATE REPORT   Final   Performed at [facility]  Admission  on 04/06/2014, Discharged on 04/16/2014  No results displayed because visit has over 200 results.       Assessment/Plan 1. Anemia in chronic renal disease Procrit to be increased per Hematology recs  2. Type 2 diabetes mellitus with diabetic nephropathy Stable  3. Hypertension secondary to other renal disorders Controlled  4. End-stage renal disease on hemodialysis Unchanged  5. Ear drainage right Chronic. Mild. Does not seem associated with any tenderness.  6. Tension headache - traMADol (ULTRAM) 50 MG tablet; One up to 4 times daly if needed for pain  Dispense: 100 tablet; Refill: 3  7. Nausea - ondansetron (ZOFRAN ODT) 4 MG disintegrating tablet; One every 6 hours if needed for nausea  Dispense: 100 tablet; Refill: 3

## 2014-06-18 DIAGNOSIS — D631 Anemia in chronic kidney disease: Secondary | ICD-10-CM | POA: Diagnosis not present

## 2014-06-18 DIAGNOSIS — N186 End stage renal disease: Secondary | ICD-10-CM | POA: Diagnosis not present

## 2014-06-21 DIAGNOSIS — N186 End stage renal disease: Secondary | ICD-10-CM | POA: Diagnosis not present

## 2014-06-21 DIAGNOSIS — D631 Anemia in chronic kidney disease: Secondary | ICD-10-CM | POA: Diagnosis not present

## 2014-06-22 ENCOUNTER — Telehealth: Payer: Self-pay

## 2014-06-22 DIAGNOSIS — N186 End stage renal disease: Secondary | ICD-10-CM | POA: Diagnosis not present

## 2014-06-22 DIAGNOSIS — Z992 Dependence on renal dialysis: Secondary | ICD-10-CM | POA: Diagnosis not present

## 2014-06-22 DIAGNOSIS — T8612 Kidney transplant failure: Secondary | ICD-10-CM | POA: Diagnosis not present

## 2014-06-22 NOTE — Telephone Encounter (Signed)
Cytogenetics results rcvd from [place] 06/10/14.  Reviewed by Dr. Lindi Adie.  Sent to scan.

## 2014-06-23 ENCOUNTER — Observation Stay (HOSPITAL_COMMUNITY)
Admission: EM | Admit: 2014-06-23 | Discharge: 2014-06-25 | Disposition: A | Discharge status: Home / Self Care | Payer: Medicare Other | Attending: Internal Medicine | Admitting: Internal Medicine

## 2014-06-23 ENCOUNTER — Emergency Department (HOSPITAL_COMMUNITY): Payer: Medicare Other

## 2014-06-23 ENCOUNTER — Encounter (HOSPITAL_COMMUNITY): Payer: Self-pay | Admitting: Emergency Medicine

## 2014-06-23 DIAGNOSIS — N400 Enlarged prostate without lower urinary tract symptoms: Secondary | ICD-10-CM | POA: Insufficient documentation

## 2014-06-23 DIAGNOSIS — D638 Anemia in other chronic diseases classified elsewhere: Secondary | ICD-10-CM

## 2014-06-23 DIAGNOSIS — Z940 Kidney transplant status: Secondary | ICD-10-CM | POA: Diagnosis not present

## 2014-06-23 DIAGNOSIS — Z872 Personal history of diseases of the skin and subcutaneous tissue: Secondary | ICD-10-CM | POA: Insufficient documentation

## 2014-06-23 DIAGNOSIS — G40209 Localization-related (focal) (partial) symptomatic epilepsy and epileptic syndromes with complex partial seizures, not intractable, without status epilepticus: Secondary | ICD-10-CM | POA: Diagnosis not present

## 2014-06-23 DIAGNOSIS — J410 Simple chronic bronchitis: Secondary | ICD-10-CM

## 2014-06-23 DIAGNOSIS — R0602 Shortness of breath: Secondary | ICD-10-CM | POA: Insufficient documentation

## 2014-06-23 DIAGNOSIS — J189 Pneumonia, unspecified organism: Principal | ICD-10-CM | POA: Diagnosis present

## 2014-06-23 DIAGNOSIS — D631 Anemia in chronic kidney disease: Secondary | ICD-10-CM | POA: Diagnosis not present

## 2014-06-23 DIAGNOSIS — H60399 Other infective otitis externa, unspecified ear: Secondary | ICD-10-CM

## 2014-06-23 DIAGNOSIS — G4733 Obstructive sleep apnea (adult) (pediatric): Secondary | ICD-10-CM | POA: Insufficient documentation

## 2014-06-23 DIAGNOSIS — Z8619 Personal history of other infectious and parasitic diseases: Secondary | ICD-10-CM | POA: Insufficient documentation

## 2014-06-23 DIAGNOSIS — F419 Anxiety disorder, unspecified: Secondary | ICD-10-CM | POA: Diagnosis not present

## 2014-06-23 DIAGNOSIS — N528 Other male erectile dysfunction: Secondary | ICD-10-CM | POA: Insufficient documentation

## 2014-06-23 DIAGNOSIS — G5600 Carpal tunnel syndrome, unspecified upper limb: Secondary | ICD-10-CM | POA: Insufficient documentation

## 2014-06-23 DIAGNOSIS — Z79899 Other long term (current) drug therapy: Secondary | ICD-10-CM | POA: Insufficient documentation

## 2014-06-23 DIAGNOSIS — M109 Gout, unspecified: Secondary | ICD-10-CM | POA: Diagnosis not present

## 2014-06-23 DIAGNOSIS — K5900 Constipation, unspecified: Secondary | ICD-10-CM | POA: Insufficient documentation

## 2014-06-23 DIAGNOSIS — G44209 Tension-type headache, unspecified, not intractable: Secondary | ICD-10-CM | POA: Insufficient documentation

## 2014-06-23 DIAGNOSIS — G4730 Sleep apnea, unspecified: Secondary | ICD-10-CM | POA: Diagnosis not present

## 2014-06-23 DIAGNOSIS — E785 Hyperlipidemia, unspecified: Secondary | ICD-10-CM | POA: Diagnosis present

## 2014-06-23 DIAGNOSIS — J810 Acute pulmonary edema: Secondary | ICD-10-CM | POA: Insufficient documentation

## 2014-06-23 DIAGNOSIS — E1129 Type 2 diabetes mellitus with other diabetic kidney complication: Secondary | ICD-10-CM | POA: Diagnosis not present

## 2014-06-23 DIAGNOSIS — I120 Hypertensive chronic kidney disease with stage 5 chronic kidney disease or end stage renal disease: Secondary | ICD-10-CM | POA: Diagnosis not present

## 2014-06-23 DIAGNOSIS — E1165 Type 2 diabetes mellitus with hyperglycemia: Secondary | ICD-10-CM | POA: Insufficient documentation

## 2014-06-23 DIAGNOSIS — N189 Chronic kidney disease, unspecified: Secondary | ICD-10-CM | POA: Diagnosis not present

## 2014-06-23 DIAGNOSIS — Z992 Dependence on renal dialysis: Secondary | ICD-10-CM | POA: Insufficient documentation

## 2014-06-23 DIAGNOSIS — N186 End stage renal disease: Secondary | ICD-10-CM | POA: Diagnosis not present

## 2014-06-23 DIAGNOSIS — J9811 Atelectasis: Secondary | ICD-10-CM | POA: Diagnosis not present

## 2014-06-23 DIAGNOSIS — Z7982 Long term (current) use of aspirin: Secondary | ICD-10-CM | POA: Diagnosis not present

## 2014-06-23 DIAGNOSIS — E559 Vitamin D deficiency, unspecified: Secondary | ICD-10-CM | POA: Diagnosis not present

## 2014-06-23 DIAGNOSIS — R0609 Other forms of dyspnea: Secondary | ICD-10-CM

## 2014-06-23 DIAGNOSIS — I10 Essential (primary) hypertension: Secondary | ICD-10-CM | POA: Diagnosis present

## 2014-06-23 DIAGNOSIS — R7309 Other abnormal glucose: Secondary | ICD-10-CM

## 2014-06-23 DIAGNOSIS — Z87891 Personal history of nicotine dependence: Secondary | ICD-10-CM | POA: Diagnosis not present

## 2014-06-23 DIAGNOSIS — R109 Unspecified abdominal pain: Secondary | ICD-10-CM | POA: Diagnosis not present

## 2014-06-23 DIAGNOSIS — R072 Precordial pain: Secondary | ICD-10-CM | POA: Diagnosis not present

## 2014-06-23 DIAGNOSIS — R7303 Prediabetes: Secondary | ICD-10-CM

## 2014-06-23 DIAGNOSIS — R079 Chest pain, unspecified: Secondary | ICD-10-CM | POA: Diagnosis present

## 2014-06-23 LAB — CBC WITH DIFFERENTIAL/PLATELET
Basophils Absolute: 0 10*3/uL (ref 0.0–0.1)
Basophils Relative: 1 % (ref 0–1)
EOS PCT: 1 % (ref 0–5)
Eosinophils Absolute: 0.1 10*3/uL (ref 0.0–0.7)
HEMATOCRIT: 30.8 % — AB (ref 39.0–52.0)
HEMOGLOBIN: 10.5 g/dL — AB (ref 13.0–17.0)
Lymphocytes Relative: 14 % (ref 12–46)
Lymphs Abs: 1 10*3/uL (ref 0.7–4.0)
MCH: 35.6 pg — ABNORMAL HIGH (ref 26.0–34.0)
MCHC: 34.1 g/dL (ref 30.0–36.0)
MCV: 104.4 fL — AB (ref 78.0–100.0)
Monocytes Absolute: 0.5 10*3/uL (ref 0.1–1.0)
Monocytes Relative: 8 % (ref 3–12)
NEUTROS PCT: 76 % (ref 43–77)
Neutro Abs: 5.4 10*3/uL (ref 1.7–7.7)
Platelets: 141 10*3/uL — ABNORMAL LOW (ref 150–400)
RBC: 2.95 MIL/uL — ABNORMAL LOW (ref 4.22–5.81)
RDW: 15.9 % — AB (ref 11.5–15.5)
WBC: 7 10*3/uL (ref 4.0–10.5)

## 2014-06-23 LAB — COMPREHENSIVE METABOLIC PANEL
ALK PHOS: 79 U/L (ref 38–126)
ALT: 20 U/L (ref 17–63)
ANION GAP: 16 — AB (ref 5–15)
AST: 19 U/L (ref 15–41)
Albumin: 4.2 g/dL (ref 3.5–5.0)
BUN: 19 mg/dL (ref 6–20)
CO2: 30 mmol/L (ref 22–32)
Calcium: 8.9 mg/dL (ref 8.9–10.3)
Chloride: 90 mmol/L — ABNORMAL LOW (ref 101–111)
Creatinine, Ser: 6.35 mg/dL — ABNORMAL HIGH (ref 0.61–1.24)
GFR, EST AFRICAN AMERICAN: 11 mL/min — AB (ref >60)
GFR, EST NON AFRICAN AMERICAN: 9 mL/min — AB (ref >60)
GLUCOSE: 142 mg/dL — AB (ref 65–99)
Potassium: 3.4 mmol/L — ABNORMAL LOW (ref 3.5–5.1)
SODIUM: 136 mmol/L (ref 135–145)
TOTAL PROTEIN: 8.2 g/dL — AB (ref 6.5–8.1)
Total Bilirubin: 0.4 mg/dL (ref 0.3–1.2)

## 2014-06-23 LAB — TROPONIN I
Troponin I: <0.03 ng/mL (ref <0.031)
Troponin I: <0.03 ng/mL (ref <0.031)
Troponin I: <0.03 ng/mL (ref <0.031)

## 2014-06-23 LAB — PROTIME-INR
INR: 0.91 (ref 0.00–1.49)
Prothrombin Time: 12.5 seconds (ref 11.6–15.2)

## 2014-06-23 LAB — PROCALCITONIN: PROCALCITONIN: 1.73 ng/mL

## 2014-06-23 LAB — APTT: APTT: 32 s (ref 24–37)

## 2014-06-23 MED ORDER — ASPIRIN 81 MG PO CHEW
324.0000 mg | CHEWABLE_TABLET | Freq: Once | ORAL | Status: AC
  Administered 2014-06-23: 324 mg via ORAL
  Filled 2014-06-23: qty 4

## 2014-06-23 MED ORDER — NEOMYCIN-POLYMYXIN-HC 3.5-10000-1 OT SUSP
3.0000 [drp] | Freq: Four times a day (QID) | OTIC | Status: DC
  Administered 2014-06-23 – 2014-06-25 (×6): 3 [drp] via OTIC
  Filled 2014-06-23: qty 10

## 2014-06-23 MED ORDER — ONDANSETRON HCL 4 MG/2ML IJ SOLN
4.0000 mg | Freq: Four times a day (QID) | INTRAMUSCULAR | Status: DC | PRN
  Administered 2014-06-23 – 2014-06-25 (×4): 4 mg via INTRAVENOUS
  Filled 2014-06-23 (×4): qty 2

## 2014-06-23 MED ORDER — IPRATROPIUM-ALBUTEROL 0.5-2.5 (3) MG/3ML IN SOLN: 3.0000 mL | Freq: Four times a day (QID) | RESPIRATORY_TRACT | Status: DC | PRN

## 2014-06-23 MED ORDER — PROMETHAZINE HCL 25 MG/ML IJ SOLN
12.5000 mg | Freq: Four times a day (QID) | INTRAMUSCULAR | Status: DC | PRN
  Administered 2014-06-23 – 2014-06-24 (×2): 12.5 mg via INTRAVENOUS
  Filled 2014-06-23 (×2): qty 1

## 2014-06-23 MED ORDER — DEXTROSE 5 % IV SOLN: 2.0000 g | Freq: Two times a day (BID) | INTRAVENOUS | Status: DC

## 2014-06-23 MED ORDER — AMLODIPINE BESYLATE 5 MG PO TABS
10.0000 mg | ORAL_TABLET | Freq: Every day | ORAL | Status: DC
  Administered 2014-06-24 – 2014-06-25 (×2): 10 mg via ORAL
  Filled 2014-06-23 (×3): qty 2

## 2014-06-23 MED ORDER — RENA-VITE PO TABS
1.0000 | ORAL_TABLET | Freq: Every day | ORAL | Status: DC
  Administered 2014-06-23 – 2014-06-24 (×2): 1 via ORAL
  Filled 2014-06-23 (×2): qty 1

## 2014-06-23 MED ORDER — DOXAZOSIN MESYLATE 2 MG PO TABS
2.0000 mg | ORAL_TABLET | Freq: Two times a day (BID) | ORAL | Status: DC
  Administered 2014-06-23 – 2014-06-25 (×4): 2 mg via ORAL
  Filled 2014-06-23 (×4): qty 1

## 2014-06-23 MED ORDER — VANCOMYCIN HCL 10 G IV SOLR
1500.0000 mg | Freq: Once | INTRAVENOUS | Status: AC
  Administered 2014-06-23: 1500 mg via INTRAVENOUS
  Filled 2014-06-23: qty 1500

## 2014-06-23 MED ORDER — PRAVASTATIN SODIUM 40 MG PO TABS
40.0000 mg | ORAL_TABLET | Freq: Every evening | ORAL | Status: DC
  Administered 2014-06-23 – 2014-06-24 (×2): 40 mg via ORAL
  Filled 2014-06-23 (×2): qty 1

## 2014-06-23 MED ORDER — ACETAMINOPHEN 325 MG PO TABS: 650.0000 mg | ORAL_TABLET | Freq: Four times a day (QID) | ORAL | Status: DC | PRN

## 2014-06-23 MED ORDER — IPRATROPIUM-ALBUTEROL 18-103 MCG/ACT IN AERO: 2.0000 | INHALATION_SPRAY | Freq: Four times a day (QID) | RESPIRATORY_TRACT | Status: DC | PRN

## 2014-06-23 MED ORDER — HYDROMORPHONE HCL 1 MG/ML IJ SOLN
1.0000 mg | Freq: Once | INTRAMUSCULAR | Status: AC
Start: 2014-06-23 — End: 2014-06-23
  Administered 2014-06-23: 1 mg via INTRAVENOUS
  Filled 2014-06-23: qty 1

## 2014-06-23 MED ORDER — IOHEXOL 350 MG/ML SOLN
100.0000 mL | Freq: Once | INTRAVENOUS | Status: AC | PRN
  Administered 2014-06-23: 100 mL via INTRAVENOUS

## 2014-06-23 MED ORDER — ONDANSETRON HCL 4 MG/2ML IJ SOLN
4.0000 mg | Freq: Once | INTRAMUSCULAR | Status: AC
  Administered 2014-06-23: 4 mg via INTRAVENOUS
  Filled 2014-06-23: qty 2

## 2014-06-23 MED ORDER — NITROGLYCERIN 0.4 MG SL SUBL
0.4000 mg | SUBLINGUAL_TABLET | SUBLINGUAL | Status: DC | PRN
  Administered 2014-06-24 (×2): 0.4 mg via SUBLINGUAL
  Filled 2014-06-23 (×2): qty 1

## 2014-06-23 MED ORDER — TRAMADOL HCL 50 MG PO TABS
50.0000 mg | ORAL_TABLET | Freq: Four times a day (QID) | ORAL | Status: DC | PRN
  Administered 2014-06-24: 50 mg via ORAL
  Filled 2014-06-23: qty 1

## 2014-06-23 MED ORDER — ASPIRIN EC 81 MG PO TBEC
81.0000 mg | DELAYED_RELEASE_TABLET | Freq: Every day | ORAL | Status: DC
  Administered 2014-06-23 – 2014-06-25 (×3): 81 mg via ORAL
  Filled 2014-06-23 (×3): qty 1

## 2014-06-23 MED ORDER — NITROGLYCERIN 2 % TD OINT
1.0000 [in_us] | TOPICAL_OINTMENT | Freq: Once | TRANSDERMAL | Status: AC
  Administered 2014-06-23: 1 [in_us] via TOPICAL
  Filled 2014-06-23: qty 1

## 2014-06-23 MED ORDER — NEOMYCIN-POLYMYXIN-HC 1 % OT SOLN
OTIC | Status: AC
  Filled 2014-06-23: qty 10

## 2014-06-23 MED ORDER — SODIUM CHLORIDE 0.9 % IV SOLN
1000.0000 mL | INTRAVENOUS | Status: DC
  Administered 2014-06-23: 1000 mL via INTRAVENOUS

## 2014-06-23 MED ORDER — CARVEDILOL 12.5 MG PO TABS
25.0000 mg | ORAL_TABLET | Freq: Two times a day (BID) | ORAL | Status: DC
  Administered 2014-06-24 – 2014-06-25 (×3): 25 mg via ORAL
  Filled 2014-06-23 (×3): qty 2

## 2014-06-23 MED ORDER — ONDANSETRON HCL 4 MG PO TABS
4.0000 mg | ORAL_TABLET | Freq: Four times a day (QID) | ORAL | Status: DC | PRN
  Filled 2014-06-23: qty 1

## 2014-06-23 MED ORDER — ACETAMINOPHEN 650 MG RE SUPP: 650.0000 mg | Freq: Four times a day (QID) | RECTAL | Status: DC | PRN

## 2014-06-23 MED ORDER — VITAMIN D 1000 UNITS PO TABS
5000.0000 [IU] | ORAL_TABLET | Freq: Every day | ORAL | Status: DC
  Administered 2014-06-23 – 2014-06-25 (×3): 5000 [IU] via ORAL
  Filled 2014-06-23 (×3): qty 5

## 2014-06-23 MED ORDER — HEPARIN SODIUM (PORCINE) 5000 UNIT/ML IJ SOLN
5000.0000 [IU] | Freq: Three times a day (TID) | INTRAMUSCULAR | Status: DC
  Administered 2014-06-23 – 2014-06-25 (×5): 5000 [IU] via SUBCUTANEOUS
  Filled 2014-06-23 (×5): qty 1

## 2014-06-23 MED ORDER — DEXTROSE 5 % IV SOLN
1.0000 g | INTRAVENOUS | Status: DC
  Administered 2014-06-23 – 2014-06-24 (×2): 1 g via INTRAVENOUS
  Filled 2014-06-23 (×5): qty 1

## 2014-06-23 MED ORDER — CHOLECALCIFEROL 125 MCG (5000 UT) PO TABS: 5000.0000 [IU] | ORAL_TABLET | Freq: Every morning | ORAL | Status: DC

## 2014-06-23 NOTE — ED Notes (Signed)
MD at bedside. 

## 2014-06-23 NOTE — ED Notes (Signed)
EMS reports pt had just finished his dialysis tx and c/o sudden sharp pain to right side of chest and ribs with tachypnea at 1140 today. PT denies any SOB and denies any injury. Dialysis catheter in place in the left forearm.

## 2014-06-23 NOTE — ED Notes (Signed)
Report given to [place], RN for room 322 at this time.

## 2014-06-23 NOTE — ED Provider Notes (Signed)
CSN: [id number]     Arrival date & time 06/23/14  1218 History   First MD Initiated Contact with Patient 06/23/14 1323     Chief Complaint  Patient presents with  . Chest Pain    HPI Comments: Pt was getting dialysis when at the end of the treatment he started having pain.  Patient is a 54 y.o. male presenting with chest pain. The history is provided by the patient.  Chest Pain Pain location:  Substernal area Pain quality: sharp   Pain radiates to:  Does not radiate Pain severity:  Severe Duration:  3 hours Timing:  Constant Progression:  Improving Chronicity:  New Context: breathing and at rest   Relieved by: lying on right side. Worsened by:  Deep breathing (lying flat) Ineffective treatments:  None tried Associated symptoms: shortness of breath   Associated symptoms: no abdominal pain, no cough and no fever   Risk factors: smoking   Risk factors: no aortic disease, no coronary artery disease and no prior DVT/PE   Pt does have chronic renal failure.  On dialydis.  Nothing like this has happened in the past.  Past Medical History  Diagnosis Date  . Anxiety   . Chronic headaches   . Chronic kidney disease (CKD)   . Dialysis care 02/06/2010  . HTN (hypertension)   . Sleep apnea     wears the CPAP  . Type II or unspecified type diabetes mellitus with renal manifestations, uncontrolled   . Shortness of breath   . Impotence of organic origin   . Other malaise and fatigue   . Debility, unspecified   . Cellulitis and abscess of trunk   . Kidney replaced by transplant   . Acute edema of lung, unspecified   . Unspecified constipation   . Other chronic nonalcoholic liver disease   . Localization-related (focal) (partial) epilepsy and epileptic syndromes with complex partial seizures, without mention of intractable epilepsy   . Tension headache   . Dermatophytosis of the body   . Gout, unspecified   . Pain in joint, upper arm   . Pain in joint, lower leg   . Other  nonspecific abnormal serum enzyme levels   . Essential hypertension, benign   . Acute, but ill-defined, cerebrovascular disease   . Memory loss   . Hypotension, unspecified   . Hypertrophy of prostate without urinary obstruction and other lower urinary tract symptoms (LUTS)   . Renal dialysis status(V45.11) 02/05/2010    restarted 01/02/13 ofter renal trransplant failure  . End stage renal disease   . Insomnia, unspecified   . Secondary hyperparathyroidism (of renal origin)   . Lumbago   . Unspecified vitamin D deficiency   . Carpal tunnel syndrome   . 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)   . Unspecified hereditary and idiopathic peripheral neuropathy   . Other and unspecified hyperlipidemia   . Unspecified essential hypertension   . Arteriovenous fistula, acquired   . Edema   . Vasectomy status   . Obstructive sleep apnea (adult) (pediatric)     CPAP   Past Surgical History  Procedure Laterality Date  . Inner ear surgery  1973    for deafness at age 73  . Colonoscopy    . Dialysis shunt placement    . Kidney transplant  08/17/2011    [facility]   . Placement of fistula at [facility] vein specialist    . Left heart catheterization with coronary angiogram N/A 03/21/2011    Procedure: LEFT HEART  CATHETERIZATION WITH CORONARY ANGIOGRAM;  Surgeon: [name], MD;  Location: [facility] CATH LAB;  Service: Cardiovascular;  Laterality: N/A;  . Cervical disc surgery      for spinal stenosis  . Nephrectomy      removed transplaned kidney   Family History  Problem Relation Age of Onset  . Adopted: Yes  . Colon cancer Neg Hx   . Esophageal cancer Neg Hx   . Rectal cancer Neg Hx   . Stomach cancer Neg Hx    History  Substance Use Topics  . Smoking status: Former Smoker -- 28 years    Types: Cigarettes    Quit date: 08/04/2011  . Smokeless tobacco: Never Used     Comment: 1/2 pack every 4 days  . Alcohol Use: No    Review of Systems  Constitutional: Negative for fever.   Respiratory: Positive for shortness of breath. Negative for cough.   Cardiovascular: Positive for chest pain.  Gastrointestinal: Negative for abdominal pain.  All other systems reviewed and are negative.     Allergies  Codeine  Home Medications   Prior to Admission medications   Medication Sig Start Date End Date Taking? Authorizing Provider  albuterol (PROVENTIL) (2.5 MG/3ML) 0.083% nebulizer solution Take 3 mLs (2.5 mg total) by nebulization every 6 (six) hours as needed for wheezing. 04/16/14  Yes Kathie Dike, MD  albuterol-ipratropium (COMBIVENT) 18-103 MCG/ACT inhaler Inhale 2 puffs into the lungs every 6 (six) hours as needed for wheezing. 07/08/12  Yes Lauree Chandler, NP  amLODipine (NORVASC) 10 MG tablet Take 1 tablet (10 mg total) by mouth daily. 03/29/14  Yes Estill Dooms, MD  aspirin EC 81 MG tablet Take 81 mg by mouth daily.   Yes Historical Provider, MD  b complex-vitamin c-folic acid (NEPHRO-VITE) 0.8 MG TABS tablet TAKE 1 TABLET BY MOUTH EVERY DAY 05/25/14  Yes Estill Dooms, MD  carvedilol (COREG) 25 MG tablet Take 1 tablet (25 mg total) by mouth 2 (two) times daily. Take one tablet in the morning and one tablet in the evening 03/29/14  Yes Estill Dooms, MD  Cholecalciferol 5000 UNITS TABS Take 5,000 Units by mouth every morning.   Yes Historical Provider, MD  diphenhydrAMINE (BENADRYL) 25 MG tablet Take 50 mg by mouth 2 (two) times daily.    Yes Historical Provider, MD  doxazosin (CARDURA) 4 MG tablet Take 0.5 tablet  by mouth in the am and 0.5 tablet by mouth in the pm to regulate blood pressure. Patient taking differently: Take 2 mg by mouth 2 (two) times daily. Take 0.5 tablet  by mouth in the am and 0.5 tablet by mouth in the pm to regulate blood pressure. 04/01/14  Yes Tiffany L Reed, DO  neomycin-polymyxin-hydrocortisone (CORTISPORIN) 3.5-10000-1 otic suspension 2 drops in affected ear 4 times daily to control infection 06/16/14  Yes Estill Dooms, MD  ondansetron  (ZOFRAN ODT) 4 MG disintegrating tablet One every 6 hours if needed for nausea 06/16/14  Yes Estill Dooms, MD  pravastatin (PRAVACHOL) 40 MG tablet Take 1 tablet (40 mg total) by mouth every evening. 03/29/14  Yes Estill Dooms, MD  traMADol Veatrice Bourbon) 50 MG tablet One up to 4 times daly if needed for pain 06/16/14  Yes Estill Dooms, MD   BP 148/79 mmHg  Pulse 90  Temp(Src) 98.7 F (37.1 C) (Oral)  Resp 17  Ht 5\' 7"  (1.702 m)  Wt 232 lb (105.235 kg)  BMI 36.33 kg/m2  SpO2  96% Physical Exam  Constitutional: No distress.  HENT:  Head: Normocephalic and atraumatic.  Right Ear: External ear normal.  Left Ear: External ear normal.  Eyes: Conjunctivae are normal. Right eye exhibits no discharge. Left eye exhibits no discharge. No scleral icterus.  Neck: Neck supple. No tracheal deviation present.  Cardiovascular: Normal rate, regular rhythm and intact distal pulses.   Pulmonary/Chest: Effort normal and breath sounds normal. No stridor. No respiratory distress. He has no wheezes. He has no rales.  Abdominal: Soft. Bowel sounds are normal. He exhibits no distension. There is no tenderness. There is no rebound and no guarding.  Musculoskeletal: He exhibits no edema or tenderness.  Av fistula left arm  Neurological: He is alert. He has normal strength. No cranial nerve deficit (no facial droop, extraocular movements intact, no slurred speech) or sensory deficit. He exhibits normal muscle tone. He displays no seizure activity. Coordination normal.  Skin: Skin is warm and dry. No rash noted.  Psychiatric: He has a normal mood and affect.  Nursing note and vitals reviewed.   ED Course  Procedures (including critical care time) Labs Review Labs Reviewed  CBC WITH DIFFERENTIAL/PLATELET - Abnormal; Notable for the following:    RBC 2.95 (*)    Hemoglobin 10.5 (*)    HCT 30.8 (*)    MCV 104.4 (*)    MCH 35.6 (*)    RDW 15.9 (*)    Platelets 141 (*)    All other components within normal  limits  COMPREHENSIVE METABOLIC PANEL - Abnormal; Notable for the following:    Potassium 3.4 (*)    Chloride 90 (*)    Glucose, Bld 142 (*)    Creatinine, Ser 6.35 (*)    Total Protein 8.2 (*)    GFR calc non Af Amer 9 (*)    GFR calc Af Amer 11 (*)    Anion gap 16 (*)    All other components within normal limits  TROPONIN I  APTT  PROTIME-INR  TROPONIN I  TROPONIN I    Imaging Review Ct Angio Chest Pe W/cm &/or Wo Cm  06/23/2014   CLINICAL DATA:  Send right-sided chest pain.  Chronic renal disease  EXAM: CT ANGIOGRAPHY CHEST WITH CONTRAST  TECHNIQUE: Multidetector CT imaging of the chest was performed using the standard protocol during bolus administration of intravenous contrast. Multiplanar CT image reconstructions and MIPs were obtained to evaluate the vascular anatomy.  CONTRAST:  144mL OMNIPAQUE IOHEXOL 350 MG/ML SOLN  COMPARISON:  Chest CT April 12, 2014 as well as chest radiograph June 23, 2014  FINDINGS: There is no demonstrable pulmonary embolus. There is no thoracic aortic aneurysm or dissection. Atherosclerotic change is noted in the aortic arch.  There is a 1.4 x 1.2 cm nodular opacity in the posterior segment of the right upper lobe peripherally. The borders of this lesion are slightly irregular. There is mild bibasilar atelectatic change.  No demonstrable adenopathy. Visualized thyroid appears normal. There is left anterior descending coronary artery calcification. Pericardium is not thickened. There is left ventricular hypertrophy.  In the visualized upper abdomen, there is cholelithiasis and hepatic steatosis.  There is degenerative change in the thoracic spine. There are no blastic or lytic bone lesions.  Review of the MIP images confirms the above findings.  IMPRESSION: No demonstrable pulmonary embolus.  1.4 x 1.2 cm nodular opacity abutting the pleura in the periphery of the posterior segment of the right upper lobe. This finding was not present on recent prior CT.  This  finding suggests that this opacity represents pneumonia as opposed to neoplasm given the rapid change over approximately 10 weeks. It would be prudent to consider a followup study in 6 day weeks to assess for clearing.  Patchy bibasilar atelectasis.  No adenopathy. There is left anterior descending coronary artery calcification.  Hepatic steatosis.  Cholelithiasis.   Electronically Signed   By: Lowella Grip III M.D.   On: 06/23/2014 15:28   Dg Chest Portable 1 View  06/23/2014   CLINICAL DATA:  Hervey Ard right-sided chest pain and tachypnea, acute. Chronic renal failure  EXAM: PORTABLE CHEST - 1 VIEW  COMPARISON:  April 28, 2014  FINDINGS: There is no edema or consolidation. The heart size and pulmonary vascularity are normal. No adenopathy. There is postoperative change in the lower cervical region.  IMPRESSION: No edema or consolidation.   Electronically Signed   By: Lowella Grip III M.D.   On: 06/23/2014 13:23     EKG Interpretation   Date/Time:  Wednesday June 23 2014 12:19:44 EDT Ventricular Rate:  83 PR Interval:  163 QRS Duration: 100 QT Interval:  393 QTC Calculation: 462 R Axis:   68 Text Interpretation:  Sinus rhythm Abnormal R-wave progression, early  transition Minimal ST depression, inferior leads No significant change  since last tracing Confirmed by Gissela Bloch  MD-J, Berania Peedin ([id number]) on 06/23/2014  1:35:10 PM     Medications  0.9 %  sodium chloride infusion (1,000 mLs Intravenous New Bag/Given 06/23/14 1432)  nitroGLYCERIN (NITROSTAT) SL tablet 0.4 mg (not administered)  cefTAZidime (FORTAZ) 1 g in dextrose 5 % 50 mL IVPB (not administered)  vancomycin (VANCOCIN) 1,500 mg in sodium chloride 0.9 % 500 mL IVPB (not administered)  aspirin chewable tablet 324 mg (324 mg Oral Given 06/23/14 1438)  nitroGLYCERIN (NITROGLYN) 2 % ointment 1 inch (1 inch Topical Given 06/23/14 1438)  HYDROmorphone (DILAUDID) injection 1 mg (1 mg Intravenous Given 06/23/14 1432)  ondansetron (ZOFRAN) injection 4  mg (4 mg Intravenous Given 06/23/14 1431)  iohexol (OMNIPAQUE) 350 MG/ML injection 100 mL (100 mLs Intravenous Contrast Given 06/23/14 1506)    MDM   Final diagnoses:  HCAP (healthcare-associated pneumonia)    Unusual presentation but CT scan suggests a pneumonia.  No PE.  Sx not suggestive of cardiac etiology.  Will start pt on IV abx.  Consult with medical service for admission considering his co morbidities and health care associated pna.    Dorie Rank, MD 06/23/14 [phone]

## 2014-06-23 NOTE — H&P (Signed)
[facility] History and Physical  HOSSEIN ETRIS [id number] DOB: [birth year] DOA: 06/23/2014  Referring physician: Dr. Tomi Bamberger - [facility] PCP: Estill Dooms, MD   Chief Complaint: CP  HPI: Kirk [name] is a 54 y.o. male  CP started today after dialysis. Described as "lightning pain." R side of chest. No radiation. Worse w/ deep breathing. Improved w/ pain medication in ED. associated with shortness of breath but denies fevers, lower extremity swelling, rash, headache, nausea, vomiting or diaphoresis. Patient states that he is felt more week and lethargic over the last 7-10 days. Symptoms of chest pain are constant and unchanged since onset.  Review of Systems:  Constitutional:  No weight loss, night sweats, Fevers, chills, fatigue.  HEENT:  No headaches, Difficulty swallowing,Tooth/dental problems,Sore throat,  No sneezing, itching, ear ache, nasal congestion, post nasal drip,  Cardio-vascular: Per HPI GI:  No heartburn, indigestion, abdominal pain, nausea, vomiting, diarrhea, change in bowel habits, loss of appetite  Resp: Per HPI Skin:  no rash or lesions.  GU:  no dysuria, change in color of urine, no urgency or frequency. No flank pain.  Musculoskeletal:   No joint pain or swelling. No decreased range of motion. No back pain.  Psych:  No change in mood or affect. No depression or anxiety. No memory loss.   Past Medical History  Diagnosis Date  . Anxiety   . Chronic headaches   . Chronic kidney disease (CKD)   . Dialysis care 02/06/2010  . HTN (hypertension)   . Sleep apnea     wears the CPAP  . Type II or unspecified type diabetes mellitus with renal manifestations, uncontrolled   . Shortness of breath   . Impotence of organic origin   . Other malaise and fatigue   . Debility, unspecified   . Cellulitis and abscess of trunk   . Kidney replaced by transplant   . Acute edema of lung, unspecified   . Unspecified constipation   . Other chronic nonalcoholic  liver disease   . Localization-related (focal) (partial) epilepsy and epileptic syndromes with complex partial seizures, without mention of intractable epilepsy   . Tension headache   . Dermatophytosis of the body   . Gout, unspecified   . Pain in joint, upper arm   . Pain in joint, lower leg   . Other nonspecific abnormal serum enzyme levels   . Essential hypertension, benign   . Acute, but ill-defined, cerebrovascular disease   . Memory loss   . Hypotension, unspecified   . Hypertrophy of prostate without urinary obstruction and other lower urinary tract symptoms (LUTS)   . Renal dialysis status(V45.11) 02/05/2010    restarted 01/02/13 ofter renal trransplant failure  . End stage renal disease   . Insomnia, unspecified   . Secondary hyperparathyroidism (of renal origin)   . Lumbago   . Unspecified vitamin D deficiency   . Carpal tunnel syndrome   . 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)   . Unspecified hereditary and idiopathic peripheral neuropathy   . Other and unspecified hyperlipidemia   . Unspecified essential hypertension   . Arteriovenous fistula, acquired   . Edema   . Vasectomy status   . Obstructive sleep apnea (adult) (pediatric)     CPAP   Past Surgical History  Procedure Laterality Date  . Inner ear surgery  1973    for deafness at age 52  . Colonoscopy    . Dialysis shunt placement    . Kidney transplant  08/17/2011  [facility]   . Placement of fistula at [facility] vein specialist    . Left heart catheterization with coronary angiogram N/A 03/21/2011    Procedure: LEFT HEART CATHETERIZATION WITH CORONARY ANGIOGRAM;  Surgeon: [name], MD;  Location: [facility] CATH LAB;  Service: Cardiovascular;  Laterality: N/A;  . Cervical disc surgery      for spinal stenosis  . Nephrectomy      removed transplaned kidney   Social History:  reports that he quit smoking about 2 years ago. His smoking use included Cigarettes. He quit after 28 years of use. He has  never used smokeless tobacco. He reports that he does not drink alcohol or use illicit drugs.  Allergies  Allergen Reactions  . Codeine Nausea And Vomiting and Nausea Only    Family History  Problem Relation Age of Onset  . Adopted: Yes  . Colon cancer Neg Hx   . Esophageal cancer Neg Hx   . Rectal cancer Neg Hx   . Stomach cancer Neg Hx      Prior to Admission medications   Medication Sig Start Date End Date Taking? Authorizing Provider  albuterol (PROVENTIL) (2.5 MG/3ML) 0.083% nebulizer solution Take 3 mLs (2.5 mg total) by nebulization every 6 (six) hours as needed for wheezing. 04/16/14  Yes Kathie Dike, MD  albuterol-ipratropium (COMBIVENT) 18-103 MCG/ACT inhaler Inhale 2 puffs into the lungs every 6 (six) hours as needed for wheezing. 07/08/12  Yes Lauree Chandler, NP  amLODipine (NORVASC) 10 MG tablet Take 1 tablet (10 mg total) by mouth daily. 03/29/14  Yes Estill Dooms, MD  aspirin EC 81 MG tablet Take 81 mg by mouth daily.   Yes Historical Provider, MD  b complex-vitamin c-folic acid (NEPHRO-VITE) 0.8 MG TABS tablet TAKE 1 TABLET BY MOUTH EVERY DAY 05/25/14  Yes Estill Dooms, MD  carvedilol (COREG) 25 MG tablet Take 1 tablet (25 mg total) by mouth 2 (two) times daily. Take one tablet in the morning and one tablet in the evening 03/29/14  Yes Estill Dooms, MD  Cholecalciferol 5000 UNITS TABS Take 5,000 Units by mouth every morning.   Yes Historical Provider, MD  diphenhydrAMINE (BENADRYL) 25 MG tablet Take 50 mg by mouth 2 (two) times daily.    Yes Historical Provider, MD  doxazosin (CARDURA) 4 MG tablet Take 0.5 tablet  by mouth in the am and 0.5 tablet by mouth in the pm to regulate blood pressure. Patient taking differently: Take 2 mg by mouth 2 (two) times daily. Take 0.5 tablet  by mouth in the am and 0.5 tablet by mouth in the pm to regulate blood pressure. 04/01/14  Yes Tiffany L Reed, DO  neomycin-polymyxin-hydrocortisone (CORTISPORIN) 3.5-10000-1 otic suspension 2  drops in affected ear 4 times daily to control infection 06/16/14  Yes Estill Dooms, MD  ondansetron (ZOFRAN ODT) 4 MG disintegrating tablet One every 6 hours if needed for nausea 06/16/14  Yes Estill Dooms, MD  pravastatin (PRAVACHOL) 40 MG tablet Take 1 tablet (40 mg total) by mouth every evening. 03/29/14  Yes Estill Dooms, MD  traMADol Veatrice Bourbon) 50 MG tablet One up to 4 times daly if needed for pain 06/16/14  Yes Estill Dooms, MD   Physical Exam: Filed Vitals:   06/23/14 1730 06/23/14 1800 06/23/14 1830 06/23/14 1853  BP: 117/58  129/66 121/58  Pulse: 76 72 82 79  Temp:   98.3 F (36.8 C) 98.3 F (36.8 C)  TempSrc:   Oral  Oral  Resp: 16 16 20 16   Height:    5\' 8"  (1.727 m)  Weight:    103.874 kg (229 lb)  SpO2: 100% 100% 100% 98%    Wt Readings from Last 3 Encounters:  06/23/14 103.874 kg (229 lb)  06/16/14 103.148 kg (227 lb 6.4 oz)  06/10/14 107.049 kg (236 lb)    General:  Appears calm but it'll Eyes:  PERRL, normal lids,, conjunctival injection ENT: Dry mucous membranes Neck:  no LAD, masses or thyromegaly Cardiovascular: 2/6 systolic murmur, RRR, left arm brachial fistula patent. Respiratory: Diminished breath sounds in the right lower lung, no wheezes, normal effort Abdomen:  soft, ntnd Skin:  no rash or induration seen on limited exam Musculoskeletal:  grossly normal tone BUE/BLE Psychiatric:  grossly normal mood and affect, speech fluent and appropriate Neurologic:  grossly non-focal.          Labs on Admission:  Basic Metabolic Panel:  Recent Labs Lab 06/23/14 1230  NA 136  K 3.4*  CL 90*  CO2 30  GLUCOSE 142*  BUN 19  CREATININE 6.35*  CALCIUM 8.9   Liver Function Tests:  Recent Labs Lab 06/23/14 1230  AST 19  ALT 20  ALKPHOS 79  BILITOT 0.4  PROT 8.2*  ALBUMIN 4.2   No results for input(s): LIPASE, AMYLASE in the last 168 hours. No results for input(s): AMMONIA in the last 168 hours. CBC:  Recent Labs Lab 06/23/14 1230  WBC  7.0  NEUTROABS 5.4  HGB 10.5*  HCT 30.8*  MCV 104.4*  PLT 141*   Cardiac Enzymes:  Recent Labs Lab 06/23/14 1230 06/23/14 1517  TROPONINI <0.03 <0.03    BNP (last 3 results)  Recent Labs  04/06/14 0340  BNP 264.0*    ProBNP (last 3 results) No results for input(s): PROBNP in the last 8760 hours.  CBG: No results for input(s): GLUCAP in the last 168 hours.  Radiological Exams on Admission: Ct Angio Chest Pe W/cm &/or Wo Cm  06/23/2014   CLINICAL DATA:  Send right-sided chest pain.  Chronic renal disease  EXAM: CT ANGIOGRAPHY CHEST WITH CONTRAST  TECHNIQUE: Multidetector CT imaging of the chest was performed using the standard protocol during bolus administration of intravenous contrast. Multiplanar CT image reconstructions and MIPs were obtained to evaluate the vascular anatomy.  CONTRAST:  139mL OMNIPAQUE IOHEXOL 350 MG/ML SOLN  COMPARISON:  Chest CT April 12, 2014 as well as chest radiograph June 23, 2014  FINDINGS: There is no demonstrable pulmonary embolus. There is no thoracic aortic aneurysm or dissection. Atherosclerotic change is noted in the aortic arch.  There is a 1.4 x 1.2 cm nodular opacity in the posterior segment of the right upper lobe peripherally. The borders of this lesion are slightly irregular. There is mild bibasilar atelectatic change.  No demonstrable adenopathy. Visualized thyroid appears normal. There is left anterior descending coronary artery calcification. Pericardium is not thickened. There is left ventricular hypertrophy.  In the visualized upper abdomen, there is cholelithiasis and hepatic steatosis.  There is degenerative change in the thoracic spine. There are no blastic or lytic bone lesions.  Review of the MIP images confirms the above findings.  IMPRESSION: No demonstrable pulmonary embolus.  1.4 x 1.2 cm nodular opacity abutting the pleura in the periphery of the posterior segment of the right upper lobe. This finding was not present on recent prior  CT. This finding suggests that this opacity represents pneumonia as opposed to neoplasm given the rapid change over  approximately 10 weeks. It would be prudent to consider a followup study in 6 day weeks to assess for clearing.  Patchy bibasilar atelectasis.  No adenopathy. There is left anterior descending coronary artery calcification.  Hepatic steatosis.  Cholelithiasis.   Electronically Signed   By: Lowella Grip III M.D.   On: 06/23/2014 15:28   Dg Chest Portable 1 View  06/23/2014   CLINICAL DATA:  Hervey Ard right-sided chest pain and tachypnea, acute. Chronic renal failure  EXAM: PORTABLE CHEST - 1 VIEW  COMPARISON:  April 28, 2014  FINDINGS: There is no edema or consolidation. The heart size and pulmonary vascularity are normal. No adenopathy. There is postoperative change in the lower cervical region.  IMPRESSION: No edema or consolidation.   Electronically Signed   By: Lowella Grip III M.D.   On: 06/23/2014 13:23    EKG: Independently reviewed. Sinus, nonspecific T-wave changes, no sign of ACS. No significant change previous EKG.  Assessment/Plan Principal Problem:   HCAP (healthcare-associated pneumonia) Active Problems:   ESRD (end stage renal disease)   HTN (hypertension)   Hyperlipemia   Otitis, externa, infective   Anemia of chronic disease   Pre-diabetes   COPD (chronic obstructive pulmonary disease)    HCAP: CT findings suggestive of right lower lung pneumonia. Patient with increased respiratory rate but otherwise afebrile and vital signs stable. CBC normal at 7. Patient with history of significant respiratory decompensation from pneumonia in the past. Currently airway is patent and patient appears to be comfortable from a respiratory standpoint. No evidence of PE on CT and troponins negative 2. - MedSurg - Continue vancomycin and Ceptaz - Sputum culture, Legionella antigen, strep antigen - O2 PRN - Procalcitonin  End-stage renal disease: Cr 6.35 on admission. S/p  failed renal transplant. Patient on dialysis Monday Wednesday Friday. Last dialysis on day of admission. - Renal consult, - Continue Rena-Vit   Otitis externa: Patient diagnosed with this the day prior to admission but had not yet started his drops. - Cortisporin  Pre DM: last A1c 6.o on 04/08/14.  - monitor  Anemia: Hgb 10.5. Likely secondary to end-stage renal disease - CBC in a.m.  HTN: normotensive - continuie coreg, norvasc, cardura  COPD:nio sign of exacerbation - continue combivent PRN  HLD: - cotnineu statin   Code Status: FULL DVT Prophylaxis: Hep Family Communication: Wife and daughter Disposition Plan: pending improvement  MERRELL, DAVID J, MD Family Medicine [facility] [email or web address] Password [email or web address]

## 2014-06-23 NOTE — Progress Notes (Signed)
ANTIBIOTIC CONSULT NOTE - INITIAL  Pharmacy Consult for Vancomycin Indication: pneumonia  Allergies  Allergen Reactions  . Codeine Nausea And Vomiting and Nausea Only    Patient Measurements: Height: 5\' 7"  (170.2 cm) Weight: 232 lb (105.235 kg) IBW/kg (Calculated) : 66.1 Adjusted Body Weight:   Vital Signs: Temp: 98.7 F (37.1 C) (06/01 1217) Temp Source: Oral (06/01 1217) BP: 148/79 mmHg (06/01 1440) Pulse Rate: 90 (06/01 1440) Intake/Output from previous day:   Intake/Output from this shift:    Labs:  Recent Labs  06/23/14 1230  WBC 7.0  HGB 10.5*  PLT 141*  CREATININE 6.35*   Estimated Creatinine Clearance: 15.7 mL/min (by C-G formula based on Cr of 6.35). No results for input(s): VANCOTROUGH, VANCOPEAK, VANCORANDOM, GENTTROUGH, GENTPEAK, GENTRANDOM, TOBRATROUGH, TOBRAPEAK, TOBRARND, AMIKACINPEAK, AMIKACINTROU, AMIKACIN in the last 72 hours.   Microbiology: No results found for this or any previous visit (from the past 720 hour(s)).  Medical History: Past Medical History  Diagnosis Date  . Anxiety   . Chronic headaches   . Chronic kidney disease (CKD)   . Dialysis care 02/06/2010  . HTN (hypertension)   . Sleep apnea     wears the CPAP  . Type II or unspecified type diabetes mellitus with renal manifestations, uncontrolled   . Shortness of breath   . Impotence of organic origin   . Other malaise and fatigue   . Debility, unspecified   . Cellulitis and abscess of trunk   . Kidney replaced by transplant   . Acute edema of lung, unspecified   . Unspecified constipation   . Other chronic nonalcoholic liver disease   . Localization-related (focal) (partial) epilepsy and epileptic syndromes with complex partial seizures, without mention of intractable epilepsy   . Tension headache   . Dermatophytosis of the body   . Gout, unspecified   . Pain in joint, upper arm   . Pain in joint, lower leg   . Other nonspecific abnormal serum enzyme levels   .  Essential hypertension, benign   . Acute, but ill-defined, cerebrovascular disease   . Memory loss   . Hypotension, unspecified   . Hypertrophy of prostate without urinary obstruction and other lower urinary tract symptoms (LUTS)   . Renal dialysis status(V45.11) 02/05/2010    restarted 01/02/13 ofter renal trransplant failure  . End stage renal disease   . Insomnia, unspecified   . Secondary hyperparathyroidism (of renal origin)   . Lumbago   . Unspecified vitamin D deficiency   . Carpal tunnel syndrome   . 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)   . Unspecified hereditary and idiopathic peripheral neuropathy   . Other and unspecified hyperlipidemia   . Unspecified essential hypertension   . Arteriovenous fistula, acquired   . Edema   . Vasectomy status   . Obstructive sleep apnea (adult) (pediatric)     CPAP    Medications:  Scheduled:   Assessment: 54 yo dialysis patient in ED, vancomycin protocol for pneumonia PTA medications reviewed  Goal of Therapy:  Vancomycin pre-HD level 15-25 mcg/ml Vancomycin post-HD level 5-15 mcg/ml  Plan:  Vancomycin 1500 mg IV loading dose, then Vancomycin 1 GM IV after each dialysis F/U dialysis days for next Vancomycin dose Labs per protocol  Kirk [name], Kirk [name] 06/23/2014,4:22 PM

## 2014-06-23 NOTE — Progress Notes (Addendum)
Pt c/o nausea. Gave Zofran 4mg  IV at 2005. Nausea unrelieved. Pt stated, "I don't feel any better". Paged Dr Hilbert Bible. Waiting to hear back from dr. Continuing to monitor pt. Bed remains in lowest position and call bell is within reach.  2100 Schorr ordered 12.5 mg Phenergan. Nausea relieved.

## 2014-06-23 NOTE — ED Notes (Signed)
Dialysis nurse at bedside at this time

## 2014-06-23 NOTE — ED Notes (Signed)
Levada Dy, RN from inpatient Dialysis to come to ED to d/c dialysis access.

## 2014-06-24 DIAGNOSIS — J189 Pneumonia, unspecified organism: Secondary | ICD-10-CM | POA: Diagnosis not present

## 2014-06-24 DIAGNOSIS — I10 Essential (primary) hypertension: Secondary | ICD-10-CM

## 2014-06-24 DIAGNOSIS — F419 Anxiety disorder, unspecified: Secondary | ICD-10-CM | POA: Diagnosis not present

## 2014-06-24 DIAGNOSIS — J449 Chronic obstructive pulmonary disease, unspecified: Secondary | ICD-10-CM

## 2014-06-24 DIAGNOSIS — G4733 Obstructive sleep apnea (adult) (pediatric): Secondary | ICD-10-CM | POA: Diagnosis not present

## 2014-06-24 DIAGNOSIS — D649 Anemia, unspecified: Secondary | ICD-10-CM | POA: Diagnosis not present

## 2014-06-24 DIAGNOSIS — D638 Anemia in other chronic diseases classified elsewhere: Secondary | ICD-10-CM

## 2014-06-24 DIAGNOSIS — R0602 Shortness of breath: Secondary | ICD-10-CM | POA: Diagnosis not present

## 2014-06-24 DIAGNOSIS — N186 End stage renal disease: Secondary | ICD-10-CM | POA: Diagnosis not present

## 2014-06-24 DIAGNOSIS — Z992 Dependence on renal dialysis: Secondary | ICD-10-CM | POA: Diagnosis not present

## 2014-06-24 LAB — TROPONIN I

## 2014-06-24 LAB — BASIC METABOLIC PANEL
Anion gap: 16 — ABNORMAL HIGH (ref 5–15)
BUN: 38 mg/dL — ABNORMAL HIGH (ref 6–20)
CHLORIDE: 91 mmol/L — AB (ref 101–111)
CO2: 28 mmol/L (ref 22–32)
CREATININE: 9.9 mg/dL — AB (ref 0.61–1.24)
Calcium: 8 mg/dL — ABNORMAL LOW (ref 8.9–10.3)
GFR calc Af Amer: 6 mL/min — ABNORMAL LOW (ref >60)
GFR calc non Af Amer: 5 mL/min — ABNORMAL LOW (ref >60)
Glucose, Bld: 156 mg/dL — ABNORMAL HIGH (ref 65–99)
POTASSIUM: 4.2 mmol/L (ref 3.5–5.1)
Sodium: 135 mmol/L (ref 135–145)

## 2014-06-24 LAB — CBC
HCT: 28.2 % — ABNORMAL LOW (ref 39.0–52.0)
Hemoglobin: 9.1 g/dL — ABNORMAL LOW (ref 13.0–17.0)
MCH: 34.2 pg — AB (ref 26.0–34.0)
MCHC: 32.3 g/dL (ref 30.0–36.0)
MCV: 106 fL — ABNORMAL HIGH (ref 78.0–100.0)
Platelets: 117 10*3/uL — ABNORMAL LOW (ref 150–400)
RBC: 2.66 MIL/uL — ABNORMAL LOW (ref 4.22–5.81)
RDW: 15.8 % — ABNORMAL HIGH (ref 11.5–15.5)
WBC: 6.5 10*3/uL (ref 4.0–10.5)

## 2014-06-24 MED ORDER — GUAIFENESIN ER 600 MG PO TB12
1200.0000 mg | ORAL_TABLET | Freq: Two times a day (BID) | ORAL | Status: DC
  Administered 2014-06-24 (×2): 1200 mg via ORAL
  Filled 2014-06-24 (×2): qty 2

## 2014-06-24 MED ORDER — VANCOMYCIN HCL IN DEXTROSE 1-5 GM/200ML-% IV SOLN
1000.0000 mg | INTRAVENOUS | Status: DC
  Administered 2014-06-25: 1000 mg via INTRAVENOUS
  Filled 2014-06-24 (×2): qty 200

## 2014-06-24 MED ORDER — OXYCODONE-ACETAMINOPHEN 5-325 MG PO TABS
1.0000 | ORAL_TABLET | ORAL | Status: DC | PRN
  Administered 2014-06-24 (×2): 2 via ORAL
  Filled 2014-06-24 (×2): qty 2

## 2014-06-24 NOTE — Care Management Note (Signed)
Case Management Note  Patient Details  Name: Kirk [name] MRN: [id number] Date of Birth: [birth year]  Subjective/Objective:                  Pt admitted from home with pneumonia. Pt lives with his wife and will return home at discharge. Pt is independent with ADL's. Pt has a cpap for home use. Pt receives dialysis at [facility] in [place].  Action/Plan: No CM needs noted.   Expected Discharge Date:                  Expected Discharge Plan:  Home/Self Care  In-House Referral:  NA  Discharge planning Services  CM Consult  Post Acute Care Choice:  NA Choice offered to:  NA  DME Arranged:    DME Agency:     HH Arranged:    HH Agency:     Status of Service:  Completed, signed off  Medicare Important Message Given:    Date Medicare IM Given:    Medicare IM give by:    Date Additional Medicare IM Given:    Additional Medicare Important Message give by:     If discussed at [place] of Stay Meetings, dates discussed:    Additional Comments:  Joylene Draft, RN 06/24/2014, 11:41 AM

## 2014-06-24 NOTE — Progress Notes (Signed)
Pt c/o chest pain this am rated as 9/10 when taking deep breaths. Paged Dr Darrick Meigs, who ordered STAT EKG and STAT Troponins. No other complaints per pt at this time. Called RT regarding EKG. Administering SL Nitro at this time. Continuing to monitor. Will make day nurse aware.

## 2014-06-24 NOTE — Progress Notes (Deleted)
Lab notified nurse of critical potassium 2.7. Paged Dr Darrick Meigs, who ordered X3 runs of potassium 10 MEQ.  No complaints by pt at this time. Continuing to monitor.  Will also make day shift nurse aware.

## 2014-06-24 NOTE — Progress Notes (Signed)
[facility] PROGRESS NOTE  Kirk [name] [id number] DOB: [birth year] DOA: 06/23/2014 PCP: Estill Dooms, MD  Assessment/Plan: 1. Healthcare associated pneumonia. CT findings suggestive of right lower lobe pneumonia. He has associated pleuritic chest pain. No evidence of pulmonary embolism on imaging. Continue intravenous antibiotic for now. Possible transition oral antibiotic tomorrow. Will likely need repeat CT chest in 6-8 weeks to ensure resolution. 2. End-stage renal disease on hemodialysis. Nephrology consulted. Next dialysis session is 6/3 3. Anemia of chronic disease. Stable. Continue to follow. 4. Hypertension. Stable. Continue to follow. 5. COPD. No evidence of wheezing. Continue as needed dilators. 6. Hyperlipidemia. Continue statin  Code Status: Full code Family Communication: Discussed with patient Disposition Plan: Discharge home once improved   Consultants:  Nephrology  Procedures:    Antibiotics:  Vancomycin 6/1>>  Ceftazidime 6/1>>  HPI/Subjective: Has continued pleuritic chest pain, no productive cough, no shortness of breath  Objective: Filed Vitals:   06/24/14 0448  BP: 141/65  Pulse: 83  Temp: 99.4 F (37.4 C)  Resp: 18    Intake/Output Summary (Last 24 hours) at 06/24/14 1058 Last data filed at 06/24/14 [id number]  Gross per 24 hour  Intake    240 ml  Output      0 ml  Net    240 ml   Filed Weights   06/23/14 1217 06/23/14 1853  Weight: 105.235 kg (232 lb) 103.874 kg (229 lb)    Exam:   General:  NAD  Cardiovascular: s1, s2, rrr  Respiratory: cta b  Abdomen: soft, nt, nd, bs+  Musculoskeletal: no edema b/l   Data Reviewed: Basic Metabolic Panel:  Recent Labs Lab 06/23/14 1230 06/24/14 0548  NA 136 135  K 3.4* 4.2  CL 90* 91*  CO2 30 28  GLUCOSE 142* 156*  BUN 19 38*  CREATININE 6.35* 9.90*  CALCIUM 8.9 8.0*   Liver Function Tests:  Recent Labs Lab 06/23/14 1230  AST 19  ALT 20  ALKPHOS 79  BILITOT  0.4  PROT 8.2*  ALBUMIN 4.2   No results for input(s): LIPASE, AMYLASE in the last 168 hours. No results for input(s): AMMONIA in the last 168 hours. CBC:  Recent Labs Lab 06/23/14 1230 06/24/14 0548  WBC 7.0 6.5  NEUTROABS 5.4  --   HGB 10.5* 9.1*  HCT 30.8* 28.2*  MCV 104.4* 106.0*  PLT 141* 117*   Cardiac Enzymes:  Recent Labs Lab 06/23/14 1230 06/23/14 1517 06/23/14 1913 06/24/14 0548  TROPONINI <0.03 <0.03 <0.03 <0.03   BNP (last 3 results)  Recent Labs  04/06/14 0340  BNP 264.0*    ProBNP (last 3 results) No results for input(s): PROBNP in the last 8760 hours.  CBG: No results for input(s): GLUCAP in the last 168 hours.  Recent Results (from the past 240 hour(s))  Culture, blood (routine x 2) Call MD if unable to obtain prior to antibiotics being given     Status: None (Preliminary result)   Collection Time: 06/23/14  7:13 PM  Result Value Ref Range Status   Specimen Description BLOOD RIGHT HAND  Final   Special Requests BOTTLES DRAWN AEROBIC AND ANAEROBIC [facility] EACH  Final   Culture PENDING  Incomplete   Report Status PENDING  Incomplete  Culture, blood (routine x 2) Call MD if unable to obtain prior to antibiotics being given     Status: None (Preliminary result)   Collection Time: 06/23/14  8:50 PM  Result Value Ref Range Status   Specimen Description BLOOD  RIGHT HAND  Final   Special Requests BOTTLES DRAWN AEROBIC AND ANAEROBIC [facility]  Final   Culture PENDING  Incomplete   Report Status PENDING  Incomplete     Studies: Ct Angio Chest Pe W/cm &/or Wo Cm  06/23/2014   CLINICAL DATA:  Send right-sided chest pain.  Chronic renal disease  EXAM: CT ANGIOGRAPHY CHEST WITH CONTRAST  TECHNIQUE: Multidetector CT imaging of the chest was performed using the standard protocol during bolus administration of intravenous contrast. Multiplanar CT image reconstructions and MIPs were obtained to evaluate the vascular anatomy.  CONTRAST:  176mL OMNIPAQUE IOHEXOL 350  MG/ML SOLN  COMPARISON:  Chest CT April 12, 2014 as well as chest radiograph June 23, 2014  FINDINGS: There is no demonstrable pulmonary embolus. There is no thoracic aortic aneurysm or dissection. Atherosclerotic change is noted in the aortic arch.  There is a 1.4 x 1.2 cm nodular opacity in the posterior segment of the right upper lobe peripherally. The borders of this lesion are slightly irregular. There is mild bibasilar atelectatic change.  No demonstrable adenopathy. Visualized thyroid appears normal. There is left anterior descending coronary artery calcification. Pericardium is not thickened. There is left ventricular hypertrophy.  In the visualized upper abdomen, there is cholelithiasis and hepatic steatosis.  There is degenerative change in the thoracic spine. There are no blastic or lytic bone lesions.  Review of the MIP images confirms the above findings.  IMPRESSION: No demonstrable pulmonary embolus.  1.4 x 1.2 cm nodular opacity abutting the pleura in the periphery of the posterior segment of the right upper lobe. This finding was not present on recent prior CT. This finding suggests that this opacity represents pneumonia as opposed to neoplasm given the rapid change over approximately 10 weeks. It would be prudent to consider a followup study in 6 day weeks to assess for clearing.  Patchy bibasilar atelectasis.  No adenopathy. There is left anterior descending coronary artery calcification.  Hepatic steatosis.  Cholelithiasis.   Electronically Signed   By: Lowella Grip III M.D.   On: 06/23/2014 15:28   Dg Chest Portable 1 View  06/23/2014   CLINICAL DATA:  Hervey Ard right-sided chest pain and tachypnea, acute. Chronic renal failure  EXAM: PORTABLE CHEST - 1 VIEW  COMPARISON:  April 28, 2014  FINDINGS: There is no edema or consolidation. The heart size and pulmonary vascularity are normal. No adenopathy. There is postoperative change in the lower cervical region.  IMPRESSION: No edema or  consolidation.   Electronically Signed   By: Lowella Grip III M.D.   On: 06/23/2014 13:23    Scheduled Meds: . amLODipine  10 mg Oral Daily  . aspirin EC  81 mg Oral Daily  . carvedilol  25 mg Oral BID  . cefTAZidime (FORTAZ)  IV  1 g Intravenous Q24H  . cholecalciferol  5,000 Units Oral Daily  . doxazosin  2 mg Oral BID  . heparin  5,000 Units Subcutaneous 3 times per day  . multivitamin  1 tablet Oral QHS  . neomycin-polymyxin-hydrocortisone  3 drop Right Ear 4 times per day  . pravastatin  40 mg Oral QPM   Continuous Infusions: . sodium chloride 1,000 mL (06/23/14 1432)    Principal Problem:   HCAP (healthcare-associated pneumonia) Active Problems:   ESRD (end stage renal disease)   HTN (hypertension)   Hyperlipemia   Otitis, externa, infective   Anemia of chronic disease   Pre-diabetes   COPD (chronic obstructive pulmonary disease)  Time spent: 72mins    Kirk [name]  [facility] Pager [phone]. If 7PM-7AM, please contact night-coverage at [email or web address], password [facility] 06/24/2014, 10:58 AM

## 2014-06-24 NOTE — Consult Note (Signed)
Reason for Consult: End-stage renal disease Referring Physician: Dr. Bonnetta Barry is an 54 y.o. male.  HPI: This patient was history of hypertension, diabetes, sleep apnea, end-stage renal disease on maintenance hemodialysis presently came with right-sided chest pain after he finished dialysis. According to the patient started having sharp pain on the right side which seems to be aggravated with breathing. He denies any cough, difficulty breathing or orthopnea. Presently his is feeling much better. He denies any nausea, vomiting, fever chills or sweating.  Past Medical History  Diagnosis Date  . Anxiety   . Chronic headaches   . Chronic kidney disease (CKD)   . Dialysis care 02/06/2010  . HTN (hypertension)   . Sleep apnea     wears the CPAP  . Type II or unspecified type diabetes mellitus with renal manifestations, uncontrolled   . Shortness of breath   . Impotence of organic origin   . Other malaise and fatigue   . Debility, unspecified   . Cellulitis and abscess of trunk   . Kidney replaced by transplant   . Acute edema of lung, unspecified   . Unspecified constipation   . Other chronic nonalcoholic liver disease   . Localization-related (focal) (partial) epilepsy and epileptic syndromes with complex partial seizures, without mention of intractable epilepsy   . Tension headache   . Dermatophytosis of the body   . Gout, unspecified   . Pain in joint, upper arm   . Pain in joint, lower leg   . Other nonspecific abnormal serum enzyme levels   . Essential hypertension, benign   . Acute, but ill-defined, cerebrovascular disease   . Memory loss   . Hypotension, unspecified   . Hypertrophy of prostate without urinary obstruction and other lower urinary tract symptoms (LUTS)   . Renal dialysis status(V45.11) 02/05/2010    restarted 01/02/13 ofter renal trransplant failure  . End stage renal disease   . Insomnia, unspecified   . Secondary hyperparathyroidism (of renal  origin)   . Lumbago   . Unspecified vitamin D deficiency   . Carpal tunnel syndrome   . 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)   . Unspecified hereditary and idiopathic peripheral neuropathy   . Other and unspecified hyperlipidemia   . Unspecified essential hypertension   . Arteriovenous fistula, acquired   . Edema   . Vasectomy status   . Obstructive sleep apnea (adult) (pediatric)     CPAP    Past Surgical History  Procedure Laterality Date  . Inner ear surgery  1973    for deafness at age 58  . Colonoscopy    . Dialysis shunt placement    . Kidney transplant  08/17/2011    [facility]   . Placement of fistula at [facility] vein specialist    . Left heart catheterization with coronary angiogram N/A 03/21/2011    Procedure: LEFT HEART CATHETERIZATION WITH CORONARY ANGIOGRAM;  Surgeon: [name], MD;  Location: [facility] CATH LAB;  Service: Cardiovascular;  Laterality: N/A;  . Cervical disc surgery      for spinal stenosis  . Nephrectomy      removed transplaned kidney    Family History  Problem Relation Age of Onset  . Adopted: Yes  . Colon cancer Neg Hx   . Esophageal cancer Neg Hx   . Rectal cancer Neg Hx   . Stomach cancer Neg Hx     Social History:  reports that he quit smoking about 2 years ago. His smoking use  included Cigarettes. He quit after 28 years of use. He has never used smokeless tobacco. He reports that he does not drink alcohol or use illicit drugs.  Allergies:  Allergies  Allergen Reactions  . Codeine Nausea And Vomiting and Nausea Only    Medications: I have reviewed the patient's current medications.  Results for orders placed or performed during the hospital encounter of 06/23/14 (from the past 48 hour(s))  CBC with Differential     Status: Abnormal   Collection Time: 06/23/14 12:30 PM  Result Value Ref Range   WBC 7.0 4.0 - 10.5 K/uL   RBC 2.95 (L) 4.22 - 5.81 MIL/uL   Hemoglobin 10.5 (L) 13.0 - 17.0 g/dL   HCT 30.8 (L) 39.0 - 52.0 %    MCV 104.4 (H) 78.0 - 100.0 fL   MCH 35.6 (H) 26.0 - 34.0 pg   MCHC 34.1 30.0 - 36.0 g/dL   RDW 15.9 (H) 11.5 - 15.5 %   Platelets 141 (L) 150 - 400 K/uL   Neutrophils Relative % 76 43 - 77 %   Neutro Abs 5.4 1.7 - 7.7 K/uL   Lymphocytes Relative 14 12 - 46 %   Lymphs Abs 1.0 0.7 - 4.0 K/uL   Monocytes Relative 8 3 - 12 %   Monocytes Absolute 0.5 0.1 - 1.0 K/uL   Eosinophils Relative 1 0 - 5 %   Eosinophils Absolute 0.1 0.0 - 0.7 K/uL   Basophils Relative 1 0 - 1 %   Basophils Absolute 0.0 0.0 - 0.1 K/uL  Comprehensive metabolic panel     Status: Abnormal   Collection Time: 06/23/14 12:30 PM  Result Value Ref Range   Sodium 136 135 - 145 mmol/L   Potassium 3.4 (L) 3.5 - 5.1 mmol/L   Chloride 90 (L) 101 - 111 mmol/L   CO2 30 22 - 32 mmol/L   Glucose, Bld 142 (H) 65 - 99 mg/dL   BUN 19 6 - 20 mg/dL   Creatinine, Ser 6.35 (H) 0.61 - 1.24 mg/dL   Calcium 8.9 8.9 - 10.3 mg/dL   Total Protein 8.2 (H) 6.5 - 8.1 g/dL   Albumin 4.2 3.5 - 5.0 g/dL   AST 19 15 - 41 U/L   ALT 20 17 - 63 U/L   Alkaline Phosphatase 79 38 - 126 U/L   Total Bilirubin 0.4 0.3 - 1.2 mg/dL   GFR calc non Af Amer 9 (L) >60 mL/min   GFR calc Af Amer 11 (L) >60 mL/min    Comment: (NOTE) The eGFR has been calculated using the CKD EPI equation. This calculation has not been validated in all clinical situations. eGFR's persistently <60 mL/min signify possible Chronic Kidney Disease.    Anion gap 16 (H) 5 - 15  Troponin I     Status: None   Collection Time: 06/23/14 12:30 PM  Result Value Ref Range   Troponin I <0.03 <0.031 ng/mL    Comment:        NO INDICATION OF MYOCARDIAL INJURY.   APTT     Status: None   Collection Time: 06/23/14 12:30 PM  Result Value Ref Range   aPTT 32 24 - 37 seconds  Protime-INR     Status: None   Collection Time: 06/23/14 12:30 PM  Result Value Ref Range   Prothrombin Time 12.5 11.6 - 15.2 seconds   INR 0.91 0.00 - 1.49  Troponin I     Status: None   Collection Time:  06/23/14  3:17  PM  Result Value Ref Range   Troponin I <0.03 <0.031 ng/mL    Comment:        NO INDICATION OF MYOCARDIAL INJURY.   Troponin I     Status: None   Collection Time: 06/23/14  7:13 PM  Result Value Ref Range   Troponin I <0.03 <0.031 ng/mL    Comment:        NO INDICATION OF MYOCARDIAL INJURY.   Culture, blood (routine x 2) Call MD if unable to obtain prior to antibiotics being given     Status: None (Preliminary result)   Collection Time: 06/23/14  7:13 PM  Result Value Ref Range   Specimen Description BLOOD RIGHT HAND    Special Requests BOTTLES DRAWN AEROBIC AND ANAEROBIC 6CC EACH    Culture NO GROWTH 1 DAY    Report Status PENDING   Procalcitonin - Baseline     Status: None   Collection Time: 06/23/14  7:13 PM  Result Value Ref Range   Procalcitonin 1.73 ng/mL    Comment:        Interpretation: PCT > 0.5 ng/mL and <= 2 ng/mL: Systemic infection (sepsis) is possible, but other conditions are known to elevate PCT as well. (NOTE)         ICU PCT Algorithm               Non ICU PCT Algorithm    ----------------------------     ------------------------------         PCT < 0.25 ng/mL                 PCT < 0.1 ng/mL     Stopping of antibiotics            Stopping of antibiotics       strongly encouraged.               strongly encouraged.    ----------------------------     ------------------------------       PCT level decrease by               PCT < 0.25 ng/mL       >= 80% from peak PCT       OR PCT 0.25 - 0.5 ng/mL          Stopping of antibiotics                                             encouraged.     Stopping of antibiotics           encouraged.    ----------------------------     ------------------------------       PCT level decrease by              PCT >= 0.25 ng/mL       < 80% from peak PCT        AND PCT >= 0.5 ng/mL             Continuing antibiotics                                              encouraged.       Continuing antibiotics  encouraged.    ----------------------------     ------------------------------     PCT level increase compared          PCT > 0.5 ng/mL         with peak PCT AND          PCT >= 0.5 ng/mL             Escalation of antibiotics                                          strongly encouraged.      Escalation of antibiotics        strongly encouraged.   Culture, blood (routine x 2) Call MD if unable to obtain prior to antibiotics being given     Status: None (Preliminary result)   Collection Time: 06/23/14  8:50 PM  Result Value Ref Range   Specimen Description BLOOD RIGHT HAND    Special Requests BOTTLES DRAWN AEROBIC AND ANAEROBIC 6CC    Culture NO GROWTH 1 DAY    Report Status PENDING   CBC     Status: Abnormal   Collection Time: 06/24/14  5:48 AM  Result Value Ref Range   WBC 6.5 4.0 - 10.5 K/uL   RBC 2.66 (L) 4.22 - 5.81 MIL/uL   Hemoglobin 9.1 (L) 13.0 - 17.0 g/dL   HCT 28.2 (L) 39.0 - 52.0 %   MCV 106.0 (H) 78.0 - 100.0 fL   MCH 34.2 (H) 26.0 - 34.0 pg   MCHC 32.3 30.0 - 36.0 g/dL   RDW 15.8 (H) 11.5 - 15.5 %   Platelets 117 (L) 150 - 400 K/uL    Comment: SPECIMEN CHECKED FOR CLOTS PLATELET COUNT CONFIRMED BY SMEAR   Basic metabolic panel     Status: Abnormal   Collection Time: 06/24/14  5:48 AM  Result Value Ref Range   Sodium 135 135 - 145 mmol/L   Potassium 4.2 3.5 - 5.1 mmol/L    Comment: DELTA CHECK NOTED   Chloride 91 (L) 101 - 111 mmol/L   CO2 28 22 - 32 mmol/L   Glucose, Bld 156 (H) 65 - 99 mg/dL   BUN 38 (H) 6 - 20 mg/dL   Creatinine, Ser 9.90 (H) 0.61 - 1.24 mg/dL   Calcium 8.0 (L) 8.9 - 10.3 mg/dL   GFR calc non Af Amer 5 (L) >60 mL/min   GFR calc Af Amer 6 (L) >60 mL/min    Comment: (NOTE) The eGFR has been calculated using the CKD EPI equation. This calculation has not been validated in all clinical situations. eGFR's persistently <60 mL/min signify possible Chronic Kidney Disease.    Anion gap 16 (H) 5 - 15  Troponin I     Status: None    Collection Time: 06/24/14  5:48 AM  Result Value Ref Range   Troponin I <0.03 <0.031 ng/mL    Comment:        NO INDICATION OF MYOCARDIAL INJURY.     Ct Angio Chest Pe W/cm &/or Wo Cm  06/23/2014   CLINICAL DATA:  Send right-sided chest pain.  Chronic renal disease  EXAM: CT ANGIOGRAPHY CHEST WITH CONTRAST  TECHNIQUE: Multidetector CT imaging of the chest was performed using the standard protocol during bolus administration of intravenous contrast. Multiplanar CT image reconstructions and MIPs were obtained to evaluate the vascular anatomy.  CONTRAST:  160m  OMNIPAQUE IOHEXOL 350 MG/ML SOLN  COMPARISON:  Chest CT April 12, 2014 as well as chest radiograph June 23, 2014  FINDINGS: There is no demonstrable pulmonary embolus. There is no thoracic aortic aneurysm or dissection. Atherosclerotic change is noted in the aortic arch.  There is a 1.4 x 1.2 cm nodular opacity in the posterior segment of the right upper lobe peripherally. The borders of this lesion are slightly irregular. There is mild bibasilar atelectatic change.  No demonstrable adenopathy. Visualized thyroid appears normal. There is left anterior descending coronary artery calcification. Pericardium is not thickened. There is left ventricular hypertrophy.  In the visualized upper abdomen, there is cholelithiasis and hepatic steatosis.  There is degenerative change in the thoracic spine. There are no blastic or lytic bone lesions.  Review of the MIP images confirms the above findings.  IMPRESSION: No demonstrable pulmonary embolus.  1.4 x 1.2 cm nodular opacity abutting the pleura in the periphery of the posterior segment of the right upper lobe. This finding was not present on recent prior CT. This finding suggests that this opacity represents pneumonia as opposed to neoplasm given the rapid change over approximately 10 weeks. It would be prudent to consider a followup study in 6 day weeks to assess for clearing.  Patchy bibasilar atelectasis.  No  adenopathy. There is left anterior descending coronary artery calcification.  Hepatic steatosis.  Cholelithiasis.   Electronically Signed   By: Lowella Grip III M.D.   On: 06/23/2014 15:28   Dg Chest Portable 1 View  06/23/2014   CLINICAL DATA:  Hervey Ard right-sided chest pain and tachypnea, acute. Chronic renal failure  EXAM: PORTABLE CHEST - 1 VIEW  COMPARISON:  April 28, 2014  FINDINGS: There is no edema or consolidation. The heart size and pulmonary vascularity are normal. No adenopathy. There is postoperative change in the lower cervical region.  IMPRESSION: No edema or consolidation.   Electronically Signed   By: Lowella Grip III M.D.   On: 06/23/2014 13:23    Review of Systems  Constitutional: Negative for fever.  Respiratory: Negative for cough and hemoptysis.   Cardiovascular: Positive for chest pain. Negative for orthopnea.  Gastrointestinal: Negative for nausea, vomiting and abdominal pain.  Neurological: Positive for weakness.   Blood pressure 141/65, pulse 83, temperature 99.4 F (37.4 C), temperature source Oral, resp. rate 18, height _0  (1.727 m), weight 103.874 kg (229 lb), SpO2 97 %. Physical Exam  HENT:  Mouth/Throat: No oropharyngeal exudate.  Neck: No JVD present.  Cardiovascular: Normal rate and regular rhythm.   Respiratory: No respiratory distress. He has wheezes. He has no rales.  Musculoskeletal: He exhibits no edema.    Assessment/Plan: Problem #1 chest pain: Presently was found to have, which he acquired pneumonia and patient is on antiemetics. he is a febrile and his white blood cell count is normal. Problem #2 end-stage renal disease: Status post hemodialysis yesterday. His potassium is normal. And patient does not have any uremic signs and symptoms Problem #3 anemia his hemoglobin is below our target goal Problem #4 hypertension his blood pressure is reasonably controlled Problem #5 history of sleep apnea: He is on Cipro at home Problem #6 history  of COPD: Patient continues to smoke Problem #7 diabetes Plan: We'll make arrangements for patient to get dialysis tomorrow which is his regular schedule. We'll use 2K/2.5 calcium bath for 4 hours  will remove about 3 L if his blood pressure tolerates We'll use Epogen 6000 units IV after each dialysis. Kirk [name]  S 06/24/2014, 11:38 AM

## 2014-06-25 DIAGNOSIS — D638 Anemia in other chronic diseases classified elsewhere: Secondary | ICD-10-CM | POA: Diagnosis not present

## 2014-06-25 DIAGNOSIS — I10 Essential (primary) hypertension: Secondary | ICD-10-CM | POA: Diagnosis not present

## 2014-06-25 DIAGNOSIS — J189 Pneumonia, unspecified organism: Secondary | ICD-10-CM | POA: Diagnosis not present

## 2014-06-25 DIAGNOSIS — N186 End stage renal disease: Secondary | ICD-10-CM | POA: Diagnosis not present

## 2014-06-25 DIAGNOSIS — Z992 Dependence on renal dialysis: Secondary | ICD-10-CM | POA: Diagnosis not present

## 2014-06-25 DIAGNOSIS — D649 Anemia, unspecified: Secondary | ICD-10-CM | POA: Diagnosis not present

## 2014-06-25 LAB — RENAL FUNCTION PANEL
Albumin: 3.4 g/dL — ABNORMAL LOW (ref 3.5–5.0)
Anion gap: 15 (ref 5–15)
BUN: 51 mg/dL — ABNORMAL HIGH (ref 6–20)
CALCIUM: 7.5 mg/dL — AB (ref 8.9–10.3)
CO2: 26 mmol/L (ref 22–32)
Chloride: 93 mmol/L — ABNORMAL LOW (ref 101–111)
Creatinine, Ser: 12.78 mg/dL — ABNORMAL HIGH (ref 0.61–1.24)
GFR calc non Af Amer: 4 mL/min — ABNORMAL LOW (ref >60)
GFR, EST AFRICAN AMERICAN: 5 mL/min — AB (ref >60)
Glucose, Bld: 125 mg/dL — ABNORMAL HIGH (ref 65–99)
Phosphorus: 10.6 mg/dL — ABNORMAL HIGH (ref 2.5–4.6)
Potassium: 4 mmol/L (ref 3.5–5.1)
Sodium: 134 mmol/L — ABNORMAL LOW (ref 135–145)

## 2014-06-25 LAB — CBC
HEMATOCRIT: 26.6 % — AB (ref 39.0–52.0)
Hemoglobin: 9 g/dL — ABNORMAL LOW (ref 13.0–17.0)
MCH: 35.3 pg — AB (ref 26.0–34.0)
MCHC: 33.8 g/dL (ref 30.0–36.0)
MCV: 104.3 fL — ABNORMAL HIGH (ref 78.0–100.0)
PLATELETS: 109 10*3/uL — AB (ref 150–400)
RBC: 2.55 MIL/uL — ABNORMAL LOW (ref 4.22–5.81)
RDW: 15.4 % (ref 11.5–15.5)
WBC: 4.1 10*3/uL (ref 4.0–10.5)

## 2014-06-25 LAB — HIV ANTIBODY (ROUTINE TESTING W REFLEX): HIV SCREEN 4TH GENERATION: NONREACTIVE

## 2014-06-25 LAB — PROCALCITONIN: PROCALCITONIN: 1.23 ng/mL

## 2014-06-25 MED ORDER — LEVOFLOXACIN 500 MG PO TABS: 500.0000 mg | ORAL_TABLET | ORAL | Status: DC

## 2014-06-25 MED ORDER — EPOETIN ALFA 10000 UNIT/ML IJ SOLN
INTRAMUSCULAR | Status: AC
  Administered 2014-06-25: 10000 [IU] via SUBCUTANEOUS
  Filled 2014-06-25: qty 1

## 2014-06-25 MED ORDER — ALTEPLASE 2 MG IJ SOLR
2.0000 mg | Freq: Once | INTRAMUSCULAR | Status: DC | PRN
  Filled 2014-06-25: qty 2

## 2014-06-25 MED ORDER — SODIUM CHLORIDE 0.9 % IV SOLN: 100.0000 mL | INTRAVENOUS | Status: DC | PRN

## 2014-06-25 MED ORDER — LIDOCAINE HCL (PF) 1 % IJ SOLN: 5.0000 mL | INTRAMUSCULAR | Status: DC | PRN

## 2014-06-25 MED ORDER — OXYCODONE-ACETAMINOPHEN 5-325 MG PO TABS: 1.0000 | ORAL_TABLET | Freq: Four times a day (QID) | ORAL | Status: DC | PRN

## 2014-06-25 MED ORDER — PENTAFLUOROPROP-TETRAFLUOROETH EX AERO: 1.0000 "application " | INHALATION_SPRAY | CUTANEOUS | Status: DC | PRN

## 2014-06-25 MED ORDER — HEPARIN SODIUM (PORCINE) 1000 UNIT/ML DIALYSIS
1000.0000 [IU] | INTRAMUSCULAR | Status: DC | PRN
  Filled 2014-06-25: qty 1

## 2014-06-25 MED ORDER — LIDOCAINE-PRILOCAINE 2.5-2.5 % EX CREA: 1.0000 "application " | TOPICAL_CREAM | CUTANEOUS | Status: DC | PRN

## 2014-06-25 MED ORDER — EPOETIN ALFA 10000 UNIT/ML IJ SOLN
10000.0000 [IU] | INTRAMUSCULAR | Status: DC
  Administered 2014-06-25: 10000 [IU] via SUBCUTANEOUS
  Filled 2014-06-25 (×2): qty 1

## 2014-06-25 MED ORDER — NEPRO/CARBSTEADY PO LIQD: 237.0000 mL | ORAL | Status: DC | PRN

## 2014-06-25 NOTE — Procedures (Signed)
   HEMODIALYSIS TREATMENT NOTE:  4 hour heparin-free dialysis completed via left forearm AVF (15g/buttonhole). Goal met:  3.5 liters removed; no interruption in ultrafiltration.  All blood was reinfused.  Hemostasis achieved within 10 minutes. Report called to Quentin Cornwall, RN.  Rockwell Alexandria, RN, CDN

## 2014-06-25 NOTE — Discharge Summary (Signed)
Physician Discharge Summary  Kirk [name] [id number] DOB: 1960/02/01 DOA: 06/23/2014  PCP: Estill Dooms, MD  Admit date: 06/23/2014 Discharge date: 06/25/2014  Time spent:81minutes  Recommendations for Outpatient Follow-up:  1. Follow up with outpatient dialysis center for regular dialysis 2. Repeat Chest CT in 6 weeks to evaluate nodular opacity right upper lobe  Discharge Diagnoses:  Principal Problem:   HCAP (healthcare-associated pneumonia) Active Problems:   ESRD (end stage renal disease)   HTN (hypertension)   Hyperlipemia   Otitis, externa, infective   Anemia of chronic disease   Pre-diabetes   COPD (chronic obstructive pulmonary disease)   Discharge Condition: improved  Diet recommendation: low salt, low carb  Filed Weights   06/25/14 0600 06/25/14 1015 06/25/14 1438  Weight: 106.323 kg (234 lb 6.4 oz) 106.4 kg (234 lb 9.1 oz) 103 kg (227 lb 1.2 oz)    History of present illness:  This is a 54 year old male who is a history of end-stage renal disease on hemodialysis, presented to the hospital with complaints of chest pain. Describes a right-sided chest pain that was worse with deep breathing and cough. CT scan of the chest was done that showed possible pneumonia. He was admitted for further treatment.  Hospital Course:  Patient was started on intravenous antibiotic for HCAP. He did not have any fevers or significant shortness of breath during his hospital stay. He has been transitioned to oral Levaquin. His pleuritic pain has since resolved. CT angiogram was negative for pulmonary embolus. He was continued on dialysis per nephrology. Blood pressure stable. Remainder of lab work is also unremarkable. He is no longer having any discomfort and feels ready for discharge home. He'll complete a course of antibiotic's at home. He is advised to have a repeat chest CT of the chest on a 6-8 weeks to ensure resolution of possible pneumonia  findings.  Procedures:    Consultations:  Nephrology  Discharge Exam: Filed Vitals:   06/25/14 1438  BP: 136/68  Pulse: 83  Temp: 98.7 F (37.1 C)  Resp: 16    General: NAD Cardiovascular: s1, s2, rrr Respiratory: cta b  Discharge Instructions   Discharge Instructions    Diet - low sodium heart healthy    Complete by:  As directed      Increase activity slowly    Complete by:  As directed           Current Discharge Medication List    START taking these medications   Details  levofloxacin (LEVAQUIN) 500 MG tablet Take 1 tablet (500 mg total) by mouth every other day. Qty: 3 tablet, Refills: 0    oxyCODONE-acetaminophen (PERCOCET/ROXICET) 5-325 MG per tablet Take 1-2 tablets by mouth every 6 (six) hours as needed for moderate pain. Qty: 20 tablet, Refills: 0      CONTINUE these medications which have NOT CHANGED   Details  albuterol (PROVENTIL) (2.5 MG/3ML) 0.083% nebulizer solution Take 3 mLs (2.5 mg total) by nebulization every 6 (six) hours as needed for wheezing. Qty: 75 mL, Refills: 12    albuterol-ipratropium (COMBIVENT) 18-103 MCG/ACT inhaler Inhale 2 puffs into the lungs every 6 (six) hours as needed for wheezing. Qty: 14.7 g, Refills: 3    amLODipine (NORVASC) 10 MG tablet Take 1 tablet (10 mg total) by mouth daily. Qty: 90 tablet, Refills: 1    aspirin EC 81 MG tablet Take 81 mg by mouth daily.    b complex-vitamin c-folic acid (NEPHRO-VITE) 0.8 MG TABS tablet TAKE 1 TABLET  BY MOUTH EVERY DAY Qty: 30 tablet, Refills: 2    carvedilol (COREG) 25 MG tablet Take 1 tablet (25 mg total) by mouth 2 (two) times daily. Take one tablet in the morning and one tablet in the evening Qty: 180 tablet, Refills: 1    Cholecalciferol 5000 UNITS TABS Take 5,000 Units by mouth every morning.    diphenhydrAMINE (BENADRYL) 25 MG tablet Take 50 mg by mouth 2 (two) times daily.     doxazosin (CARDURA) 4 MG tablet Take 0.5 tablet  by mouth in the am and 0.5 tablet  by mouth in the pm to regulate blood pressure. Qty: 90 tablet, Refills: 1    neomycin-polymyxin-hydrocortisone (CORTISPORIN) 3.5-10000-1 otic suspension 2 drops in affected ear 4 times daily to control infection Qty: 10 mL, Refills: 0   Associated Diagnoses: Ear drainage right    ondansetron (ZOFRAN ODT) 4 MG disintegrating tablet One every 6 hours if needed for nausea Qty: 100 tablet, Refills: 3   Associated Diagnoses: Nausea    pravastatin (PRAVACHOL) 40 MG tablet Take 1 tablet (40 mg total) by mouth every evening. Qty: 90 tablet, Refills: 1    traMADol (ULTRAM) 50 MG tablet One up to 4 times daly if needed for pain Qty: 100 tablet, Refills: 3   Associated Diagnoses: Tension headache       Allergies  Allergen Reactions  . Codeine Nausea And Vomiting and Nausea Only      The results of significant diagnostics from this hospitalization (including imaging, microbiology, ancillary and laboratory) are listed below for reference.    Significant Diagnostic Studies: Ct Angio Chest Pe W/cm &/or Wo Cm  06/23/2014   CLINICAL DATA:  Send right-sided chest pain.  Chronic renal disease  EXAM: CT ANGIOGRAPHY CHEST WITH CONTRAST  TECHNIQUE: Multidetector CT imaging of the chest was performed using the standard protocol during bolus administration of intravenous contrast. Multiplanar CT image reconstructions and MIPs were obtained to evaluate the vascular anatomy.  CONTRAST:  162mL OMNIPAQUE IOHEXOL 350 MG/ML SOLN  COMPARISON:  Chest CT April 12, 2014 as well as chest radiograph June 23, 2014  FINDINGS: There is no demonstrable pulmonary embolus. There is no thoracic aortic aneurysm or dissection. Atherosclerotic change is noted in the aortic arch.  There is a 1.4 x 1.2 cm nodular opacity in the posterior segment of the right upper lobe peripherally. The borders of this lesion are slightly irregular. There is mild bibasilar atelectatic change.  No demonstrable adenopathy. Visualized thyroid appears  normal. There is left anterior descending coronary artery calcification. Pericardium is not thickened. There is left ventricular hypertrophy.  In the visualized upper abdomen, there is cholelithiasis and hepatic steatosis.  There is degenerative change in the thoracic spine. There are no blastic or lytic bone lesions.  Review of the MIP images confirms the above findings.  IMPRESSION: No demonstrable pulmonary embolus.  1.4 x 1.2 cm nodular opacity abutting the pleura in the periphery of the posterior segment of the right upper lobe. This finding was not present on recent prior CT. This finding suggests that this opacity represents pneumonia as opposed to neoplasm given the rapid change over approximately 10 weeks. It would be prudent to consider a followup study in 6 day weeks to assess for clearing.  Patchy bibasilar atelectasis.  No adenopathy. There is left anterior descending coronary artery calcification.  Hepatic steatosis.  Cholelithiasis.   Electronically Signed   By: Lowella Grip III M.D.   On: 06/23/2014 15:28   Ct Biopsy  06/03/2014   CLINICAL DATA:  Pancytopenia  EXAM: CT-GUIDED BIOPSY BONE MARROW ASPIRATE AND CORE.  MEDICATIONS AND MEDICAL HISTORY: Versed 2 mg, Fentanyl 100 mcg.  Additional Medications: None.  ANESTHESIA/SEDATION: Moderate sedation time: 9 minutes  PROCEDURE: The procedure, risks, benefits, and alternatives were explained to the patient. Questions regarding the procedure were encouraged and answered. The patient understands and consents to the procedure.  The back was prepped with Betadine in a sterile fashion, and a sterile drape was applied covering the operative field. A sterile gown and sterile gloves were used for the procedure.  Under CT guidance, an 11 gauge needle was inserted into the right iliac bone via posterior approach. Aspirates and a core were obtained. Final imaging was performed.  Patient tolerated the procedure well without complication. Vital sign monitoring  by nursing staff during the procedure will continue as patient is in the special procedures unit for post procedure observation.  FINDINGS: The images document guide needle placement within the right iliac bone via posterior approach. Post biopsy images demonstrate no hemorrhage.  COMPLICATIONS: None  IMPRESSION: Successful CT-guided right iliac bone marrow aspirate and core.   Electronically Signed   By: Marybelle Killings M.D.   On: 06/03/2014 15:30   Dg Chest Portable 1 View  06/23/2014   CLINICAL DATA:  Hervey Ard right-sided chest pain and tachypnea, acute. Chronic renal failure  EXAM: PORTABLE CHEST - 1 VIEW  COMPARISON:  April 28, 2014  FINDINGS: There is no edema or consolidation. The heart size and pulmonary vascularity are normal. No adenopathy. There is postoperative change in the lower cervical region.  IMPRESSION: No edema or consolidation.   Electronically Signed   By: Lowella Grip III M.D.   On: 06/23/2014 13:23    Microbiology: Recent Results (from the past 240 hour(s))  Culture, blood (routine x 2) Call MD if unable to obtain prior to antibiotics being given     Status: None (Preliminary result)   Collection Time: 06/23/14  7:13 PM  Result Value Ref Range Status   Specimen Description BLOOD RIGHT HAND  Final   Special Requests BOTTLES DRAWN AEROBIC AND ANAEROBIC 6CC EACH  Final   Culture NO GROWTH 1 DAY  Final   Report Status PENDING  Incomplete  Culture, blood (routine x 2) Call MD if unable to obtain prior to antibiotics being given     Status: None (Preliminary result)   Collection Time: 06/23/14  8:50 PM  Result Value Ref Range Status   Specimen Description BLOOD RIGHT HAND  Final   Special Requests BOTTLES DRAWN AEROBIC AND ANAEROBIC 6CC  Final   Culture NO GROWTH 1 DAY  Final   Report Status PENDING  Incomplete     Labs: Basic Metabolic Panel:  Recent Labs Lab 06/23/14 1230 06/24/14 0548 06/25/14 0603  NA 136 135 134*  K 3.4* 4.2 4.0  CL 90* 91* 93*  CO2 30 28 26    GLUCOSE 142* 156* 125*  BUN 19 38* 51*  CREATININE 6.35* 9.90* 12.78*  CALCIUM 8.9 8.0* 7.5*  PHOS  --   --  10.6*   Liver Function Tests:  Recent Labs Lab 06/23/14 1230 06/25/14 0603  AST 19  --   ALT 20  --   ALKPHOS 79  --   BILITOT 0.4  --   PROT 8.2*  --   ALBUMIN 4.2 3.4*   No results for input(s): LIPASE, AMYLASE in the last 168 hours. No results for input(s): AMMONIA in the last 168 hours.  CBC:  Recent Labs Lab 06/23/14 1230 06/24/14 0548 06/25/14 0603  WBC 7.0 6.5 4.1  NEUTROABS 5.4  --   --   HGB 10.5* 9.1* 9.0*  HCT 30.8* 28.2* 26.6*  MCV 104.4* 106.0* 104.3*  PLT 141* 117* 109*   Cardiac Enzymes:  Recent Labs Lab 06/23/14 1230 06/23/14 1517 06/23/14 1913 06/24/14 0548  TROPONINI <0.03 <0.03 <0.03 <0.03   BNP: BNP (last 3 results)  Recent Labs  04/06/14 0340  BNP 264.0*    ProBNP (last 3 results) No results for input(s): PROBNP in the last 8760 hours.  CBG: No results for input(s): GLUCAP in the last 168 hours.     Signed:  Shaina Gullatt  [facility] 06/25/2014, 2:57 PM

## 2014-06-25 NOTE — Care Management Note (Signed)
Case Management Note  Patient Details  Name: Kirk [name] MRN: [id number] Date of Birth: [birth year]  Subjective/Objective:                    Action/Plan:   Expected Discharge Date:                  Expected Discharge Plan:  Home/Self Care  In-House Referral:  NA  Discharge planning Services  CM Consult  Post Acute Care Choice:  NA Choice offered to:  NA  DME Arranged:    DME Agency:     HH Arranged:    HH Agency:     Status of Service:  Completed, signed off  Medicare Important Message Given:    Date Medicare IM Given:    Medicare IM give by:    Date Additional Medicare IM Given:    Additional Medicare Important Message give by:     If discussed at [facility] of Stay Meetings, dates discussed:    Additional Comments: Pt discharged home today. No CM needs noted. Pt has neb machine for home use. Christinia Gully [place], RN 06/25/2014, 3:45 PM

## 2014-06-25 NOTE — Progress Notes (Signed)
Discharge instruction reviewed with patient no distress noted. Prescriptions given to patient./

## 2014-06-25 NOTE — Progress Notes (Signed)
Subjective: Interval History: has no complaint of nausea or vomiting. His chest pain has improved and at this moment and no difficulty breathing..  Objective: Vital signs in last 24 hours: Temp:  [97.8 F (36.6 C)-99 F (37.2 C)] 98 F (36.7 C) (06/03 0600) Pulse Rate:  [71-88] 74 (06/03 0600) Resp:  [20] 20 (06/03 0600) BP: (123-132)/(57-59) 123/57 mmHg (06/03 0600) SpO2:  [95 %-100 %] 99 % (06/03 0600) Weight:  [105.96 kg (233 lb 9.6 oz)-106.323 kg (234 lb 6.4 oz)] 106.323 kg (234 lb 6.4 oz) (06/03 0600) Weight change: 0.726 kg (1 lb 9.6 oz)  Intake/Output from previous day: 06/02 0701 - 06/03 0700 In: 942 [P.O.:942] Out: 800 [Urine:100; Emesis/NG output:700] Intake/Output this shift:    General appearance: alert, cooperative and no distress Resp: clear to auscultation bilaterally Cardio: regular rate and rhythm, S1, S2 normal, no murmur, click, rub or gallop GI: soft, non-tender; bowel sounds normal; no masses,  no organomegaly Extremities: extremities normal, atraumatic, no cyanosis or edema  Lab Results:  Recent Labs  06/24/14 0548 06/25/14 0603  WBC 6.5 4.1  HGB 9.1* 9.0*  HCT 28.2* 26.6*  PLT 117* 109*   BMET:  Recent Labs  06/24/14 0548 06/25/14 0603  NA 135 134*  K 4.2 4.0  CL 91* 93*  CO2 28 26  GLUCOSE 156* 125*  BUN 38* 51*  CREATININE 9.90* 12.78*  CALCIUM 8.0* 7.5*   No results for input(s): PTH in the last 72 hours. Iron Studies: No results for input(s): IRON, TIBC, TRANSFERRIN, FERRITIN in the last 72 hours.  Studies/Results: Ct Angio Chest Pe W/cm &/or Wo Cm  06/23/2014   CLINICAL DATA:  Send right-sided chest pain.  Chronic renal disease  EXAM: CT ANGIOGRAPHY CHEST WITH CONTRAST  TECHNIQUE: Multidetector CT imaging of the chest was performed using the standard protocol during bolus administration of intravenous contrast. Multiplanar CT image reconstructions and MIPs were obtained to evaluate the vascular anatomy.  CONTRAST:  146mL OMNIPAQUE  IOHEXOL 350 MG/ML SOLN  COMPARISON:  Chest CT April 12, 2014 as well as chest radiograph June 23, 2014  FINDINGS: There is no demonstrable pulmonary embolus. There is no thoracic aortic aneurysm or dissection. Atherosclerotic change is noted in the aortic arch.  There is a 1.4 x 1.2 cm nodular opacity in the posterior segment of the right upper lobe peripherally. The borders of this lesion are slightly irregular. There is mild bibasilar atelectatic change.  No demonstrable adenopathy. Visualized thyroid appears normal. There is left anterior descending coronary artery calcification. Pericardium is not thickened. There is left ventricular hypertrophy.  In the visualized upper abdomen, there is cholelithiasis and hepatic steatosis.  There is degenerative change in the thoracic spine. There are no blastic or lytic bone lesions.  Review of the MIP images confirms the above findings.  IMPRESSION: No demonstrable pulmonary embolus.  1.4 x 1.2 cm nodular opacity abutting the pleura in the periphery of the posterior segment of the right upper lobe. This finding was not present on recent prior CT. This finding suggests that this opacity represents pneumonia as opposed to neoplasm given the rapid change over approximately 10 weeks. It would be prudent to consider a followup study in 6 day weeks to assess for clearing.  Patchy bibasilar atelectasis.  No adenopathy. There is left anterior descending coronary artery calcification.  Hepatic steatosis.  Cholelithiasis.   Electronically Signed   By: Lowella Grip III M.D.   On: 06/23/2014 15:28   Dg Chest Portable 1 View  06/23/2014   CLINICAL DATA:  Hervey Ard right-sided chest pain and tachypnea, acute. Chronic renal failure  EXAM: PORTABLE CHEST - 1 VIEW  COMPARISON:  April 28, 2014  FINDINGS: There is no edema or consolidation. The heart size and pulmonary vascularity are normal. No adenopathy. There is postoperative change in the lower cervical region.  IMPRESSION: No edema or  consolidation.   Electronically Signed   By: Lowella Grip III M.D.   On: 06/23/2014 13:23    I have reviewed the patient's current medications.  Assessment/Plan: Problem #1 chest pain: Presently patient is feeling much better. Problem #2 end-stage renal disease: He is status post hemodialysis on Wednesday and is due for dialysis today. His potassium is normal and no uremic sinus symptoms. Problem #2 hypertension: His blood pressure is reasonably controlled Problem #4 pneumonia Problem #5 anemia: His hemoglobin is below target goal but stable. Problem #6 COPD Problem #7 diabetes Plan: We'll make arrangements for patient to get dialysis today Patient could be discharged after dialysis and will be followed by his regular nephrologist as an outpatient. We'll use Epogen 10,000 units IV after each dialysis.      Aryaa Bunting S 06/25/2014,7:58 AM

## 2014-06-25 NOTE — Progress Notes (Signed)
Patient taken to dialysis via bed by nurse tech.

## 2014-06-28 DIAGNOSIS — D631 Anemia in chronic kidney disease: Secondary | ICD-10-CM | POA: Diagnosis not present

## 2014-06-28 DIAGNOSIS — N186 End stage renal disease: Secondary | ICD-10-CM | POA: Diagnosis not present

## 2014-06-28 LAB — CULTURE, BLOOD (ROUTINE X 2)
CULTURE: NO GROWTH
Culture: NO GROWTH

## 2014-06-30 DIAGNOSIS — D631 Anemia in chronic kidney disease: Secondary | ICD-10-CM | POA: Diagnosis not present

## 2014-06-30 DIAGNOSIS — N186 End stage renal disease: Secondary | ICD-10-CM | POA: Diagnosis not present

## 2014-07-01 ENCOUNTER — Telehealth: Payer: Self-pay | Admitting: Internal Medicine

## 2014-07-01 NOTE — Telephone Encounter (Signed)
Prior Authorization form received, give to CMA for review and forward to Provider if needed..Carolin Coy

## 2014-07-01 NOTE — Telephone Encounter (Signed)
Prior Authorization in process for Medication Ondansetron ODT 4 MG TABLET.....  Waiting on Prior Authorization form.  cdavis

## 2014-07-02 ENCOUNTER — Emergency Department (HOSPITAL_COMMUNITY): Payer: Medicare Other

## 2014-07-02 ENCOUNTER — Encounter (HOSPITAL_COMMUNITY): Payer: Self-pay | Admitting: Emergency Medicine

## 2014-07-02 ENCOUNTER — Emergency Department (HOSPITAL_COMMUNITY)
Admission: EM | Admit: 2014-07-02 | Discharge: 2014-07-02 | Disposition: A | Discharge status: Home / Self Care | Payer: Medicare Other | Attending: Emergency Medicine | Admitting: Emergency Medicine

## 2014-07-02 DIAGNOSIS — D649 Anemia, unspecified: Secondary | ICD-10-CM | POA: Diagnosis not present

## 2014-07-02 DIAGNOSIS — Z9981 Dependence on supplemental oxygen: Secondary | ICD-10-CM | POA: Diagnosis not present

## 2014-07-02 DIAGNOSIS — Z87438 Personal history of other diseases of male genital organs: Secondary | ICD-10-CM | POA: Insufficient documentation

## 2014-07-02 DIAGNOSIS — Z8669 Personal history of other diseases of the nervous system and sense organs: Secondary | ICD-10-CM | POA: Insufficient documentation

## 2014-07-02 DIAGNOSIS — Z8709 Personal history of other diseases of the respiratory system: Secondary | ICD-10-CM | POA: Insufficient documentation

## 2014-07-02 DIAGNOSIS — Z87891 Personal history of nicotine dependence: Secondary | ICD-10-CM | POA: Insufficient documentation

## 2014-07-02 DIAGNOSIS — F419 Anxiety disorder, unspecified: Secondary | ICD-10-CM | POA: Diagnosis not present

## 2014-07-02 DIAGNOSIS — N186 End stage renal disease: Secondary | ICD-10-CM | POA: Diagnosis not present

## 2014-07-02 DIAGNOSIS — J984 Other disorders of lung: Secondary | ICD-10-CM | POA: Diagnosis not present

## 2014-07-02 DIAGNOSIS — R162 Hepatomegaly with splenomegaly, not elsewhere classified: Secondary | ICD-10-CM | POA: Diagnosis not present

## 2014-07-02 DIAGNOSIS — R112 Nausea with vomiting, unspecified: Secondary | ICD-10-CM | POA: Diagnosis not present

## 2014-07-02 DIAGNOSIS — Z8739 Personal history of other diseases of the musculoskeletal system and connective tissue: Secondary | ICD-10-CM | POA: Insufficient documentation

## 2014-07-02 DIAGNOSIS — R109 Unspecified abdominal pain: Secondary | ICD-10-CM

## 2014-07-02 DIAGNOSIS — K8020 Calculus of gallbladder without cholecystitis without obstruction: Secondary | ICD-10-CM | POA: Diagnosis not present

## 2014-07-02 DIAGNOSIS — R1013 Epigastric pain: Secondary | ICD-10-CM | POA: Insufficient documentation

## 2014-07-02 DIAGNOSIS — Z8719 Personal history of other diseases of the digestive system: Secondary | ICD-10-CM | POA: Insufficient documentation

## 2014-07-02 DIAGNOSIS — D631 Anemia in chronic kidney disease: Secondary | ICD-10-CM | POA: Diagnosis not present

## 2014-07-02 DIAGNOSIS — E1122 Type 2 diabetes mellitus with diabetic chronic kidney disease: Secondary | ICD-10-CM | POA: Diagnosis not present

## 2014-07-02 DIAGNOSIS — Z940 Kidney transplant status: Secondary | ICD-10-CM | POA: Diagnosis not present

## 2014-07-02 DIAGNOSIS — Z872 Personal history of diseases of the skin and subcutaneous tissue: Secondary | ICD-10-CM | POA: Insufficient documentation

## 2014-07-02 DIAGNOSIS — I120 Hypertensive chronic kidney disease with stage 5 chronic kidney disease or end stage renal disease: Secondary | ICD-10-CM | POA: Insufficient documentation

## 2014-07-02 LAB — CBC WITH DIFFERENTIAL/PLATELET
Basophils Absolute: 0 10*3/uL (ref 0.0–0.1)
Basophils Relative: 0 % (ref 0–1)
EOS ABS: 0.1 10*3/uL (ref 0.0–0.7)
Eosinophils Relative: 2 % (ref 0–5)
HEMATOCRIT: 33.5 % — AB (ref 39.0–52.0)
Hemoglobin: 11.2 g/dL — ABNORMAL LOW (ref 13.0–17.0)
Lymphocytes Relative: 20 % (ref 12–46)
Lymphs Abs: 1 10*3/uL (ref 0.7–4.0)
MCH: 36 pg — ABNORMAL HIGH (ref 26.0–34.0)
MCHC: 33.4 g/dL (ref 30.0–36.0)
MCV: 107.7 fL — ABNORMAL HIGH (ref 78.0–100.0)
Monocytes Absolute: 0.6 10*3/uL (ref 0.1–1.0)
Monocytes Relative: 12 % (ref 3–12)
NEUTROS ABS: 3.3 10*3/uL (ref 1.7–7.7)
Neutrophils Relative %: 66 % (ref 43–77)
PLATELETS: 172 10*3/uL (ref 150–400)
RBC: 3.11 MIL/uL — ABNORMAL LOW (ref 4.22–5.81)
RDW: 16.4 % — AB (ref 11.5–15.5)
WBC: 5 10*3/uL (ref 4.0–10.5)

## 2014-07-02 LAB — COMPREHENSIVE METABOLIC PANEL
ALBUMIN: 4.2 g/dL (ref 3.5–5.0)
ALT: 23 U/L (ref 17–63)
ANION GAP: 18 — AB (ref 5–15)
AST: 28 U/L (ref 15–41)
Alkaline Phosphatase: 74 U/L (ref 38–126)
BUN: 21 mg/dL — ABNORMAL HIGH (ref 6–20)
CALCIUM: 8.9 mg/dL (ref 8.9–10.3)
CO2: 28 mmol/L (ref 22–32)
Chloride: 91 mmol/L — ABNORMAL LOW (ref 101–111)
Creatinine, Ser: 6.98 mg/dL — ABNORMAL HIGH (ref 0.61–1.24)
GFR, EST AFRICAN AMERICAN: 9 mL/min — AB (ref >60)
GFR, EST NON AFRICAN AMERICAN: 8 mL/min — AB (ref >60)
Glucose, Bld: 196 mg/dL — ABNORMAL HIGH (ref 65–99)
Potassium: 3.8 mmol/L (ref 3.5–5.1)
SODIUM: 137 mmol/L (ref 135–145)
Total Bilirubin: 0.4 mg/dL (ref 0.3–1.2)
Total Protein: 7.9 g/dL (ref 6.5–8.1)

## 2014-07-02 LAB — LIPASE, BLOOD: Lipase: 89 U/L — ABNORMAL HIGH (ref 22–51)

## 2014-07-02 LAB — TROPONIN I

## 2014-07-02 LAB — AMYLASE: Amylase: 159 U/L — ABNORMAL HIGH (ref 28–100)

## 2014-07-02 LAB — CBG MONITORING, ED: GLUCOSE-CAPILLARY: 173 mg/dL — AB (ref 65–99)

## 2014-07-02 MED ORDER — HYDROMORPHONE HCL 1 MG/ML IJ SOLN
1.0000 mg | Freq: Once | INTRAMUSCULAR | Status: AC
  Administered 2014-07-02: 1 mg via INTRAVENOUS
  Filled 2014-07-02: qty 1

## 2014-07-02 MED ORDER — ONDANSETRON HCL 4 MG/2ML IJ SOLN
4.0000 mg | Freq: Once | INTRAMUSCULAR | Status: AC
  Administered 2014-07-02: 4 mg via INTRAVENOUS
  Filled 2014-07-02: qty 2

## 2014-07-02 MED ORDER — IOHEXOL 300 MG/ML  SOLN
100.0000 mL | Freq: Once | INTRAMUSCULAR | Status: AC | PRN
  Administered 2014-07-02: 100 mL via INTRAVENOUS

## 2014-07-02 MED ORDER — PROMETHAZINE HCL 25 MG PO TABS: 25.0000 mg | ORAL_TABLET | Freq: Three times a day (TID) | ORAL | Status: DC | PRN

## 2014-07-02 MED ORDER — IOHEXOL 300 MG/ML  SOLN
25.0000 mL | Freq: Once | INTRAMUSCULAR | Status: AC | PRN
  Administered 2014-07-02: 25 mL via ORAL

## 2014-07-02 MED ORDER — SODIUM CHLORIDE 0.9 % IV BOLUS (SEPSIS): 500.0000 mL | Freq: Once | INTRAVENOUS | Status: DC

## 2014-07-02 MED ORDER — SODIUM CHLORIDE 0.9 % IV SOLN
INTRAVENOUS | Status: DC
  Administered 2014-07-02: 999 mL via INTRAVENOUS

## 2014-07-02 MED ORDER — ONDANSETRON 4 MG PO TBDP: 4.0000 mg | ORAL_TABLET | Freq: Three times a day (TID) | ORAL | Status: DC | PRN

## 2014-07-02 MED ORDER — FENTANYL CITRATE (PF) 100 MCG/2ML IJ SOLN
100.0000 ug | Freq: Once | INTRAMUSCULAR | Status: AC
Start: 2014-07-02 — End: 2014-07-02
  Administered 2014-07-02: 100 ug via INTRAVENOUS
  Filled 2014-07-02: qty 2

## 2014-07-02 MED ORDER — PANTOPRAZOLE SODIUM 40 MG IV SOLR
40.0000 mg | Freq: Once | INTRAVENOUS | Status: AC
  Administered 2014-07-02: 40 mg via INTRAVENOUS
  Filled 2014-07-02: qty 40

## 2014-07-02 MED ORDER — OXYCODONE-ACETAMINOPHEN 5-325 MG PO TABS: 2.0000 | ORAL_TABLET | Freq: Three times a day (TID) | ORAL | Status: DC | PRN

## 2014-07-02 NOTE — ED Notes (Signed)
Pt removed cardiac monitor and bp cuff.  Requested not to reapply until pain better as pt cannot stay still in bed.

## 2014-07-02 NOTE — ED Provider Notes (Signed)
CSN: [id number]     Arrival date & time 07/02/14  1154 History   First MD Initiated Contact with Patient 07/02/14 1320     Chief Complaint  Patient presents with  . Abdominal Pain     (Consider location/radiation/quality/duration/timing/severity/associated sxs/prior Treatment) Patient is a 54 y.o. male presenting with abdominal pain. The history is provided by the patient.  Abdominal Pain Associated symptoms: nausea and vomiting   Associated symptoms: no chest pain, no diarrhea, no fever and no shortness of breath    patient is a dialysis patient only dialyze Monday Wednesdays and Fridays. During dialysis this morning about 8:00 in the morning patient developed epigastric abdominal pain that was severe. Associated with nausea and vomiting. But in reality more nausea more just the dry heaves. Pain 10 out of 10 nonradiating sharp in nature. Patient did complete dialysis. Patient denies any fevers. Patient has a history of gallstones.  Past Medical History  Diagnosis Date  . Anxiety   . Chronic headaches   . Chronic kidney disease (CKD)   . Dialysis care 02/06/2010  . HTN (hypertension)   . Sleep apnea     wears the CPAP  . Type II or unspecified type diabetes mellitus with renal manifestations, uncontrolled   . Shortness of breath   . Impotence of organic origin   . Other malaise and fatigue   . Debility, unspecified   . Cellulitis and abscess of trunk   . Kidney replaced by transplant   . Acute edema of lung, unspecified   . Unspecified constipation   . Other chronic nonalcoholic liver disease   . Localization-related (focal) (partial) epilepsy and epileptic syndromes with complex partial seizures, without mention of intractable epilepsy   . Tension headache   . Dermatophytosis of the body   . Gout, unspecified   . Pain in joint, upper arm   . Pain in joint, lower leg   . Other nonspecific abnormal serum enzyme levels   . Essential hypertension, benign   . Acute, but  ill-defined, cerebrovascular disease   . Memory loss   . Hypotension, unspecified   . Hypertrophy of prostate without urinary obstruction and other lower urinary tract symptoms (LUTS)   . Renal dialysis status(V45.11) 02/05/2010    restarted 01/02/13 ofter renal trransplant failure  . End stage renal disease   . Insomnia, unspecified   . Secondary hyperparathyroidism (of renal origin)   . Lumbago   . Unspecified vitamin D deficiency   . Carpal tunnel syndrome   . 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)   . Unspecified hereditary and idiopathic peripheral neuropathy   . Other and unspecified hyperlipidemia   . Unspecified essential hypertension   . Arteriovenous fistula, acquired   . Edema   . Vasectomy status   . Obstructive sleep apnea (adult) (pediatric)     CPAP   Past Surgical History  Procedure Laterality Date  . Inner ear surgery  1973    for deafness at age 54  . Colonoscopy    . Dialysis shunt placement    . Kidney transplant  08/17/2011    [facility]   . Placement of fistula at [facility] vein specialist    . Left heart catheterization with coronary angiogram N/A 03/21/2011    Procedure: LEFT HEART CATHETERIZATION WITH CORONARY ANGIOGRAM;  Surgeon: [name], MD;  Location: [facility] CATH LAB;  Service: Cardiovascular;  Laterality: N/A;  . Cervical disc surgery      for spinal stenosis  . Nephrectomy  removed transplaned kidney   Family History  Problem Relation Age of Onset  . Adopted: Yes  . Colon cancer Neg Hx   . Esophageal cancer Neg Hx   . Rectal cancer Neg Hx   . Stomach cancer Neg Hx    History  Substance Use Topics  . Smoking status: Former Smoker -- 28 years    Types: Cigarettes    Quit date: 08/04/2011  . Smokeless tobacco: Never Used     Comment: 1/2 pack every 4 days  . Alcohol Use: No    Review of Systems  Constitutional: Negative for fever.  HENT: Negative for congestion.   Respiratory: Negative for shortness of breath.    Cardiovascular: Negative for chest pain.  Gastrointestinal: Positive for nausea, vomiting and abdominal pain. Negative for diarrhea.  Musculoskeletal: Negative for back pain.  Skin: Negative for rash.  Neurological: Negative for headaches.  Hematological: Bruises/bleeds easily.  Psychiatric/Behavioral: Negative for confusion.      Allergies  Codeine  Home Medications   Prior to Admission medications   Medication Sig Start Date End Date Taking? Authorizing Provider  albuterol (PROVENTIL) (2.5 MG/3ML) 0.083% nebulizer solution Take 3 mLs (2.5 mg total) by nebulization every 6 (six) hours as needed for wheezing. 04/16/14  Yes Kathie Dike, MD  albuterol-ipratropium (COMBIVENT) 18-103 MCG/ACT inhaler Inhale 2 puffs into the lungs every 6 (six) hours as needed for wheezing. 07/08/12  Yes Lauree Chandler, NP  amLODipine (NORVASC) 10 MG tablet Take 1 tablet (10 mg total) by mouth daily. 03/29/14  Yes Estill Dooms, MD  aspirin EC 81 MG tablet Take 81 mg by mouth daily.   Yes Historical Provider, MD  b complex-vitamin c-folic acid (NEPHRO-VITE) 0.8 MG TABS tablet TAKE 1 TABLET BY MOUTH EVERY DAY 05/25/14  Yes Estill Dooms, MD  calcium acetate, Phos Binder, (PHOSLYRA) 667 MG/5ML SOLN Take 2,001 mg by mouth 3 (three) times daily with meals. And 667 mg with snack   Yes Historical Provider, MD  carvedilol (COREG) 25 MG tablet Take 1 tablet (25 mg total) by mouth 2 (two) times daily. Take one tablet in the morning and one tablet in the evening 03/29/14  Yes Estill Dooms, MD  Cholecalciferol 5000 UNITS TABS Take 5,000 Units by mouth every morning.   Yes Historical Provider, MD  diphenhydrAMINE (BENADRYL) 25 MG tablet Take 50 mg by mouth 2 (two) times daily.    Yes Historical Provider, MD  doxazosin (CARDURA) 4 MG tablet Take 0.5 tablet  by mouth in the am and 0.5 tablet by mouth in the pm to regulate blood pressure. Patient taking differently: Take 2 mg by mouth 2 (two) times daily. Take 0.5 tablet   by mouth in the am and 0.5 tablet by mouth in the pm to regulate blood pressure. 04/01/14  Yes Tiffany L Reed, DO  levofloxacin (LEVAQUIN) 500 MG tablet Take 1 tablet (500 mg total) by mouth every other day. 06/25/14  Yes Kathie Dike, MD  neomycin-polymyxin-hydrocortisone (CORTISPORIN) 3.5-10000-1 otic suspension 2 drops in affected ear 4 times daily to control infection 06/16/14  Yes Estill Dooms, MD  ondansetron (ZOFRAN ODT) 4 MG disintegrating tablet One every 6 hours if needed for nausea 06/16/14  Yes Estill Dooms, MD  oxyCODONE-acetaminophen (PERCOCET/ROXICET) 5-325 MG per tablet Take 1-2 tablets by mouth every 6 (six) hours as needed for moderate pain. 06/25/14  Yes Kathie Dike, MD  pravastatin (PRAVACHOL) 40 MG tablet Take 1 tablet (40 mg total) by mouth every evening.  03/29/14  Yes Estill Dooms, MD  traMADol Veatrice Bourbon) 50 MG tablet One up to 4 times daly if needed for pain 06/16/14  Yes Estill Dooms, MD  ondansetron (ZOFRAN ODT) 4 MG disintegrating tablet Take 1 tablet (4 mg total) by mouth every 8 (eight) hours as needed. 07/02/14   Fredia Sorrow, MD  oxyCODONE-acetaminophen (PERCOCET/ROXICET) 5-325 MG per tablet Take 2 tablets by mouth every 8 (eight) hours as needed for severe pain. 07/02/14   Fredia Sorrow, MD  promethazine (PHENERGAN) 25 MG tablet Take 1 tablet (25 mg total) by mouth every 8 (eight) hours as needed. 07/02/14   Fredia Sorrow, MD   BP 158/86 mmHg  Pulse 86  Temp(Src) 97.4 F (36.3 C) (Oral)  Resp 19  Ht 5\' 7"  (1.702 m)  Wt 234 lb (106.142 kg)  BMI 36.64 kg/m2  SpO2 98% Physical Exam  Constitutional: He is oriented to person, place, and time. He appears well-developed and well-nourished. He appears distressed.  HENT:  Head: Normocephalic and atraumatic.  Mouth/Throat: Oropharynx is clear and moist.  Eyes: Conjunctivae and EOM are normal. Pupils are equal, round, and reactive to light.  Neck: Normal range of motion.  Cardiovascular: Normal rate, regular  rhythm and normal heart sounds.   Pulmonary/Chest: Effort normal and breath sounds normal. No respiratory distress.  Abdominal: Soft. Bowel sounds are normal. There is no tenderness.  Musculoskeletal: Normal range of motion.  Neurological: He is alert and oriented to person, place, and time. No cranial nerve deficit. He exhibits normal muscle tone. Coordination normal.  Skin: Skin is warm.  Nursing note and vitals reviewed.   ED Course  Procedures (including critical care time) Labs Review Labs Reviewed  AMYLASE - Abnormal; Notable for the following:    Amylase 159 (*)    All other components within normal limits  CBC WITH DIFFERENTIAL/PLATELET - Abnormal; Notable for the following:    RBC 3.11 (*)    Hemoglobin 11.2 (*)    HCT 33.5 (*)    MCV 107.7 (*)    MCH 36.0 (*)    RDW 16.4 (*)    All other components within normal limits  COMPREHENSIVE METABOLIC PANEL - Abnormal; Notable for the following:    Chloride 91 (*)    Glucose, Bld 196 (*)    BUN 21 (*)    Creatinine, Ser 6.98 (*)    GFR calc non Af Amer 8 (*)    GFR calc Af Amer 9 (*)    Anion gap 18 (*)    All other components within normal limits  LIPASE, BLOOD - Abnormal; Notable for the following:    Lipase 89 (*)    All other components within normal limits  CBG MONITORING, ED - Abnormal; Notable for the following:    Glucose-Capillary 173 (*)    All other components within normal limits  TROPONIN I   Results for orders placed or performed during the hospital encounter of 07/02/14  Amylase  Result Value Ref Range   Amylase 159 (H) 28 - 100 U/L  CBC WITH DIFFERENTIAL  Result Value Ref Range   WBC 5.0 4.0 - 10.5 K/uL   RBC 3.11 (L) 4.22 - 5.81 MIL/uL   Hemoglobin 11.2 (L) 13.0 - 17.0 g/dL   HCT 33.5 (L) 39.0 - 52.0 %   MCV 107.7 (H) 78.0 - 100.0 fL   MCH 36.0 (H) 26.0 - 34.0 pg   MCHC 33.4 30.0 - 36.0 g/dL   RDW 16.4 (H) 11.5 - 15.5 %  Platelets 172 150 - 400 K/uL   Neutrophils Relative % 66 43 - 77 %    Neutro Abs 3.3 1.7 - 7.7 K/uL   Lymphocytes Relative 20 12 - 46 %   Lymphs Abs 1.0 0.7 - 4.0 K/uL   Monocytes Relative 12 3 - 12 %   Monocytes Absolute 0.6 0.1 - 1.0 K/uL   Eosinophils Relative 2 0 - 5 %   Eosinophils Absolute 0.1 0.0 - 0.7 K/uL   Basophils Relative 0 0 - 1 %   Basophils Absolute 0.0 0.0 - 0.1 K/uL  Comprehensive metabolic panel  Result Value Ref Range   Sodium 137 135 - 145 mmol/L   Potassium 3.8 3.5 - 5.1 mmol/L   Chloride 91 (L) 101 - 111 mmol/L   CO2 28 22 - 32 mmol/L   Glucose, Bld 196 (H) 65 - 99 mg/dL   BUN 21 (H) 6 - 20 mg/dL   Creatinine, Ser 6.98 (H) 0.61 - 1.24 mg/dL   Calcium 8.9 8.9 - 10.3 mg/dL   Total Protein 7.9 6.5 - 8.1 g/dL   Albumin 4.2 3.5 - 5.0 g/dL   AST 28 15 - 41 U/L   ALT 23 17 - 63 U/L   Alkaline Phosphatase 74 38 - 126 U/L   Total Bilirubin 0.4 0.3 - 1.2 mg/dL   GFR calc non Af Amer 8 (L) >60 mL/min   GFR calc Af Amer 9 (L) >60 mL/min   Anion gap 18 (H) 5 - 15  Lipase, blood  Result Value Ref Range   Lipase 89 (H) 22 - 51 U/L  Troponin I  Result Value Ref Range   Troponin I <0.03 <0.031 ng/mL  POC CBG, ED  Result Value Ref Range   Glucose-Capillary 173 (H) 65 - 99 mg/dL     Imaging Review Ct Abdomen Pelvis W Contrast  07/02/2014   CLINICAL DATA:  Severe epigastric pain and vomiting during/immediately after dialysis this morning.  EXAM: CT ABDOMEN AND PELVIS WITH CONTRAST  TECHNIQUE: Multidetector CT imaging of the abdomen and pelvis was performed using the standard protocol following bolus administration of intravenous contrast.  CONTRAST:  110mL OMNIPAQUE IOHEXOL 300 MG/ML  SOLN  COMPARISON:  CT abdomen and pelvis 02/17/2014.  Chest CT 06/23/2014.  FINDINGS: Scattered areas of mild tree-in-bud nodularity in both lower lobes do not appear significantly changed from the recent chest CT. There is a nodular opacity in the right middle lobe which extends to the pleura and measures 2.0 x 0.9 cm (series 6, image 12), more confluent than  the irregular opacity which was in this location on the prior chest CT. There is no pleural effusion.  Multiples small stones are again seen in the gallbladder without pericholecystic inflammatory change identified. There is no biliary dilatation. The liver remains enlarged without focal abnormality identified. The spleen also remains mildly enlarged. The kidneys are unchanged in appearance and demonstrate marked atrophy with calcifications bilaterally. 11 mm low-density lesion in the lower pole of the left right kidney is similar in size to the prior abdominal CT and most likely represents a cyst. Adrenal glands are unremarkable. Pancreas is unremarkable aside from an unchanged punctate calcification in the body.  Oral contrast is present in the stomach and in multiple loops of nondilated small bowel. There is no evidence of bowel obstruction. No gross bowel wall thickening is identified. The appendix is identified in the right lower quadrant with residual contrast or other dense material in its lumen and without evidence  of acute appendicitis.  Bladder is decompressed. A presumably failed right lower quadrant renal transplant is again noted. Mild atherosclerotic calcification is noted involving the abdominal aorta and iliac arteries. Postoperative scarring is noted in the right lower abdominal wall. No free fluid or enlarged lymph nodes are identified. No acute osseous abnormality is identified.  IMPRESSION: 1. No acute abnormality identified in the abdomen or pelvis. 2. Cholelithiasis. 3. Hepatosplenomegaly. 4. 2 cm nodular opacity in the right middle lobe, more confluent than that present on the recent chest CT. This is likely inflammatory given its change within such a short time course, and it can be followed up at the same time as the right upper lobe nodule described on the recent chest CT.   Electronically Signed   By: Logan Bores   On: 07/02/2014 17:17   Dg Chest Port 1 View  07/02/2014   CLINICAL  DATA:  Epigastric pain, diaphoresis  EXAM: PORTABLE CHEST - 1 VIEW  COMPARISON:  06/23/2014  FINDINGS: Cardiac shadow is stable. The lungs are well aerated bilaterally. Nodular density is again noted in the right mid lung stable from the recent CT examination. No new focal infiltrate or effusion is seen. Postsurgical changes in the cervical spine are noted.  IMPRESSION: Stable nodular density in the right lung. No new focal abnormality is seen.   Electronically Signed   By: Inez Catalina M.D.   On: 07/02/2014 14:16     EKG Interpretation   Date/Time:  Friday July 02 2014 12:06:22 EDT Ventricular Rate:  94 PR Interval:  156 QRS Duration: 96 QT Interval:  393 QTC Calculation: 491 R Axis:   78 Text Interpretation:  Sinus rhythm Borderline prolonged QT interval  Baseline wander in lead(s) I III aVL aVF Confirmed by Lacinda Axon  MD, BRIAN  [phone]) on 07/02/2014 12:31:32 PM Also confirmed by Rogene Houston  MD, Krishay Faro  [id number])  on 07/02/2014 1:41:31 PM      MDM   Final diagnoses:  Abdominal pain  Abdominal pain   Patient with epigastric abdominal pain that started during dialysis. Patient's lipase is elevated mild pancreatitis is a possibility. Patient also does have a history and evidence of the gallstones. No evidence of acute cholecystitis. No tenderness over the gallbladder. Liver function test are without significant abnormalities other than the lipase being mildly elevated. Patient improved here some with pain medicine. Belly currently nontender. Patient wants a trial at home well with clear liquids pain medicine and antinausea medicine. They will return for any new or worse symptoms. Also no leukocytosis either.  Patient's chest x-ray here today is without any acute findings. No further evidence of pneumonia.    Fredia Sorrow, MD 07/02/14 [phone]

## 2014-07-02 NOTE — ED Notes (Signed)
Pt alert & oriented x4, stable gait. Patient given discharge instructions, paperwork & prescription(s). Patient  instructed to stop at the registration desk to finish any additional paperwork. Patient  verbalized understanding. Pt left department in wheelchair w/ no further questions. 

## 2014-07-02 NOTE — ED Notes (Signed)
Pt went to dialysis this morning and after he finished tx started having severe epigastric pain and vomiting.  Pt pale and diaphoretic on arrival.

## 2014-07-02 NOTE — ED Notes (Signed)
Pt finished drinking contrast. CT aware.

## 2014-07-02 NOTE — ED Notes (Signed)
Pt sit up in the bed. No vomiting at this time.

## 2014-07-02 NOTE — ED Notes (Signed)
Pt lying on side resting with wife at bedside.  Much calmer now.  Continues to rate pain a 10/10 but is no longer rolling around and wife states he seems more comfortable.  Will continue to monitor.

## 2014-07-02 NOTE — Discharge Instructions (Signed)
Clear liquid diet for the next of 24 hours and advance to bland diet. Take pain medicine as directed. Take the Phenergan and Zofran as needed for nausea and vomiting. If not improving over the next 24 hours return. Return for any new or worse symptoms. Symptoms most likely related to inflammation of the pancreas however the gallbladder is a possible source. No evidence of any significant gallbladder inflammation at this time. Follow-up with dialysis as scheduled.

## 2014-07-02 NOTE — ED Notes (Signed)
Pt attempted to sit up to get into wheelchair and became dizzy and nauseated.  Pt laid back in bed with cool rag on head and states he feels better.  Requested some time to try again.  Wife states that if he is not improving she does not feel comfortable taking him home.

## 2014-07-02 NOTE — ED Notes (Signed)
Pt states that his pain is unchanged.  Rolling around in bed from side to side.  Pt no longer sweating at this time.

## 2014-07-02 NOTE — ED Notes (Signed)
Pt states nausea off & on at this time. Pt says it is worse w/ movement. Pt states he thinks he will be ok & wants to go home. Family afraid they are going to go home & have to return.

## 2014-07-05 DIAGNOSIS — D631 Anemia in chronic kidney disease: Secondary | ICD-10-CM | POA: Diagnosis not present

## 2014-07-05 DIAGNOSIS — N186 End stage renal disease: Secondary | ICD-10-CM | POA: Diagnosis not present

## 2014-07-06 NOTE — Telephone Encounter (Signed)
Form filled out for prior Authorization for Ondansetron ODT 4mg  and faxed to [place] Fax: [phone] #: [phone] Patient ID: [id number] Awaiting determination

## 2014-07-07 DIAGNOSIS — N186 End stage renal disease: Secondary | ICD-10-CM | POA: Diagnosis not present

## 2014-07-07 DIAGNOSIS — D631 Anemia in chronic kidney disease: Secondary | ICD-10-CM | POA: Diagnosis not present

## 2014-07-07 NOTE — Telephone Encounter (Signed)
Received fax from [employer]. Ondansetron ODT Approved 04/07/2014-07/06/2015. #: [phone]

## 2014-07-09 DIAGNOSIS — D631 Anemia in chronic kidney disease: Secondary | ICD-10-CM | POA: Diagnosis not present

## 2014-07-09 DIAGNOSIS — N186 End stage renal disease: Secondary | ICD-10-CM | POA: Diagnosis not present

## 2014-07-12 DIAGNOSIS — N186 End stage renal disease: Secondary | ICD-10-CM | POA: Diagnosis not present

## 2014-07-12 DIAGNOSIS — D631 Anemia in chronic kidney disease: Secondary | ICD-10-CM | POA: Diagnosis not present

## 2014-07-13 ENCOUNTER — Encounter: Payer: Self-pay | Admitting: Internal Medicine

## 2014-07-13 ENCOUNTER — Ambulatory Visit (INDEPENDENT_AMBULATORY_CARE_PROVIDER_SITE_OTHER): Payer: Medicare Other | Admitting: Internal Medicine

## 2014-07-13 VITALS — BP 128/70 | HR 82 | Temp 98.4°F | Resp 20 | Ht 67.0 in | Wt 234.6 lb

## 2014-07-13 DIAGNOSIS — R413 Other amnesia: Secondary | ICD-10-CM | POA: Diagnosis not present

## 2014-07-13 DIAGNOSIS — K8020 Calculus of gallbladder without cholecystitis without obstruction: Secondary | ICD-10-CM | POA: Insufficient documentation

## 2014-07-13 DIAGNOSIS — E1121 Type 2 diabetes mellitus with diabetic nephropathy: Secondary | ICD-10-CM | POA: Diagnosis not present

## 2014-07-13 DIAGNOSIS — Z992 Dependence on renal dialysis: Secondary | ICD-10-CM

## 2014-07-13 DIAGNOSIS — I10 Essential (primary) hypertension: Secondary | ICD-10-CM

## 2014-07-13 DIAGNOSIS — N189 Chronic kidney disease, unspecified: Secondary | ICD-10-CM

## 2014-07-13 DIAGNOSIS — J189 Pneumonia, unspecified organism: Secondary | ICD-10-CM | POA: Diagnosis not present

## 2014-07-13 DIAGNOSIS — D631 Anemia in chronic kidney disease: Secondary | ICD-10-CM | POA: Diagnosis not present

## 2014-07-13 DIAGNOSIS — N186 End stage renal disease: Secondary | ICD-10-CM

## 2014-07-13 DIAGNOSIS — R609 Edema, unspecified: Secondary | ICD-10-CM | POA: Diagnosis not present

## 2014-07-13 HISTORY — DX: Calculus of gallbladder without cholecystitis without obstruction: K80.20

## 2014-07-13 MED ORDER — PROMETHAZINE HCL 25 MG PO TABS: 25.0000 mg | ORAL_TABLET | Freq: Three times a day (TID) | ORAL | Status: DC | PRN

## 2014-07-13 NOTE — Progress Notes (Signed)
Patient ID: Kirk [name], male   DOB: [birth year], 54 y.o.   MRN: [id number]    Facility  [facility]    Place of Service:   OFFICE    Allergies  Allergen Reactions  . Codeine Nausea And Vomiting and Nausea Only    Chief Complaint  Patient presents with  . Hospitalization Follow-up    HPI:  Hospitalized 06/23/2014 3 06/25/2014 for pneumonia. Has completed his antibiotics. In the hospital a significant amount of pain in the left side. This has resolved.  Patient had CT scan that showed cholelithiasis. He's had a significant amount of pain related to that which is also resolved. At the time of the CT scan he also had modest elevations in both lipase and amylase suggesting a mild pancreatitis.  Continues on dialysis for end-stage renal disease. Patient feels he is tolerating the dialysis well and actually is feeling much better than he did 3 weeks ago.  Medications: Patient's Medications  New Prescriptions   No medications on file  Previous Medications   ALBUTEROL (PROVENTIL) (2.5 MG/3ML) 0.083% NEBULIZER SOLUTION    Take 3 mLs (2.5 mg total) by nebulization every 6 (six) hours as needed for wheezing.   ALBUTEROL-IPRATROPIUM (COMBIVENT) 18-103 MCG/ACT INHALER    Inhale 2 puffs into the lungs every 6 (six) hours as needed for wheezing.   AMLODIPINE (NORVASC) 10 MG TABLET    Take 1 tablet (10 mg total) by mouth daily.   ASPIRIN EC 81 MG TABLET    Take 81 mg by mouth daily.   B COMPLEX-VITAMIN C-FOLIC ACID (NEPHRO-VITE) 0.8 MG TABS TABLET    TAKE 1 TABLET BY MOUTH EVERY DAY   CALCIUM ACETATE, PHOS BINDER, (PHOSLYRA) 667 MG/5ML SOLN    Take 2,001 mg by mouth 3 (three) times daily with meals. And 667 mg with snack   CARVEDILOL (COREG) 25 MG TABLET    Take 1 tablet (25 mg total) by mouth 2 (two) times daily. Take one tablet in the morning and one tablet in the evening   CHOLECALCIFEROL 5000 UNITS TABS    Take 5,000 Units by mouth every morning.   DIPHENHYDRAMINE (BENADRYL) 25 MG TABLET    Take  50 mg by mouth 2 (two) times daily.    DOXAZOSIN (CARDURA) 4 MG TABLET    Take 0.5 tablet  by mouth in the am and 0.5 tablet by mouth in the pm to regulate blood pressure.   OXYCODONE-ACETAMINOPHEN (PERCOCET/ROXICET) 5-325 MG PER TABLET    Take 1-2 tablets by mouth every 6 (six) hours as needed for moderate pain.   OXYCODONE-ACETAMINOPHEN (PERCOCET/ROXICET) 5-325 MG PER TABLET    Take 2 tablets by mouth every 8 (eight) hours as needed for severe pain.   PRAVASTATIN (PRAVACHOL) 40 MG TABLET    Take 1 tablet (40 mg total) by mouth every evening.   TRAMADOL (ULTRAM) 50 MG TABLET    One up to 4 times daly if needed for pain  Modified Medications   Modified Medication Previous Medication   PROMETHAZINE (PHENERGAN) 25 MG TABLET promethazine (PHENERGAN) 25 MG tablet      Take 1 tablet (25 mg total) by mouth every 8 (eight) hours as needed for nausea or vomiting.    Take 1 tablet (25 mg total) by mouth every 8 (eight) hours as needed.  Discontinued Medications   LEVOFLOXACIN (LEVAQUIN) 500 MG TABLET    Take 1 tablet (500 mg total) by mouth every other day.   NEOMYCIN-POLYMYXIN-HYDROCORTISONE (CORTISPORIN) 3.5-10000-1 OTIC SUSPENSION  2 drops in affected ear 4 times daily to control infection   ONDANSETRON (ZOFRAN ODT) 4 MG DISINTEGRATING TABLET    One every 6 hours if needed for nausea   ONDANSETRON (ZOFRAN ODT) 4 MG DISINTEGRATING TABLET    Take 1 tablet (4 mg total) by mouth every 8 (eight) hours as needed.     Review of Systems  Constitutional: Negative for fever.       Chronically fatigued.  HENT: Positive for ear discharge and hearing loss. Negative for ear pain and tinnitus.   Eyes: Negative.   Respiratory: Negative for shortness of breath.        Dyspnea on exertion. Pneumonia. Hospitalized 04/06/14 and 06/23/2014.  Cardiovascular: Negative for chest pain, palpitations and leg swelling.  Gastrointestinal: Negative for abdominal pain and abdominal distention.       Cholelithiasis    Endocrine:       Diabetes is controlled.  Genitourinary: Negative.        Renal failure and kidney transplant.  Musculoskeletal:       Neck discomfort. Chronic right knee discomfort. Chronic back pains. Unsteady gait.  Neurological: Positive for dizziness.       Memory is worse. Feet feel numb. Recurrent headaches  Hematological:       Anemia of CKD.  Psychiatric/Behavioral: Positive for confusion and decreased concentration.       Insomnia. Falls asleep in the day.    Filed Vitals:   07/13/14 1525  BP: 128/70  Pulse: 82  Temp: 98.4 F (36.9 C)  TempSrc: Oral  Resp: 20  Height: $Remove'5\' 7"'uVFQsGH$  (1.702 m)  Weight: 234 lb 9.6 oz (106.414 kg)  SpO2: 95%   Body mass index is 36.73 kg/(m^2).  Physical Exam  Constitutional: He is oriented to person, place, and time.  Obese.  HENT:  Loss of hearing.  Eyes:  Corrective lenses  Neck: No JVD present. No tracheal deviation present. No thyromegaly present.  Cardiovascular: Exam reveals no gallop.   No murmur heard. AF. 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  Abdominal: Soft. Bowel sounds are normal. He exhibits no distension and no mass. There is no tenderness.  Tender in the RUQ. Frequent nausea.  Musculoskeletal: Normal range of motion. He exhibits tenderness. He exhibits no edema.  Scar left knee. Tender right knee  Lymphadenopathy:    He has no cervical adenopathy.  Neurological: He is alert and oriented to person, place, and time. No cranial nerve deficit. Coordination normal.  Positive Tinel sign bilaterally  Skin: No rash noted. No erythema. No pallor.  Psychiatric:  Drowsy during the exam.     Labs reviewed: Admission on 07/02/2014, Discharged on 07/02/2014  Component Date Value Ref Range Status  . Amylase 07/02/2014 159* 28 - 100 U/L Final  . WBC 07/02/2014 5.0  4.0 - 10.5 K/uL Final  . RBC 07/02/2014 3.11* 4.22 - 5.81 MIL/uL Final  . Hemoglobin 07/02/2014 11.2* 13.0 - 17.0 g/dL Final  . HCT 07/02/2014 33.5* 39.0 - 52.0 % Final   . MCV 07/02/2014 107.7* 78.0 - 100.0 fL Final  . MCH 07/02/2014 36.0* 26.0 - 34.0 pg Final  . MCHC 07/02/2014 33.4  30.0 - 36.0 g/dL Final  . RDW 07/02/2014 16.4* 11.5 - 15.5 % Final  . Platelets 07/02/2014 172  150 - 400 K/uL Final  . Neutrophils Relative % 07/02/2014 66  43 - 77 % Final  . Neutro Abs 07/02/2014 3.3  1.7 - 7.7 K/uL Final  . Lymphocytes Relative 07/02/2014 20  12 -  46 % Final  . Lymphs Abs 07/02/2014 1.0  0.7 - 4.0 K/uL Final  . Monocytes Relative 07/02/2014 12  3 - 12 % Final  . Monocytes Absolute 07/02/2014 0.6  0.1 - 1.0 K/uL Final  . Eosinophils Relative 07/02/2014 2  0 - 5 % Final  . Eosinophils Absolute 07/02/2014 0.1  0.0 - 0.7 K/uL Final  . Basophils Relative 07/02/2014 0  0 - 1 % Final  . Basophils Absolute 07/02/2014 0.0  0.0 - 0.1 K/uL Final  . Sodium 07/02/2014 137  135 - 145 mmol/L Final  . Potassium 07/02/2014 3.8  3.5 - 5.1 mmol/L Final  . Chloride 07/02/2014 91* 101 - 111 mmol/L Final  . CO2 07/02/2014 28  22 - 32 mmol/L Final  . Glucose, Bld 07/02/2014 196* 65 - 99 mg/dL Final  . BUN 07/02/2014 21* 6 - 20 mg/dL Final  . Creatinine, Ser 07/02/2014 6.98* 0.61 - 1.24 mg/dL Final  . Calcium 07/02/2014 8.9  8.9 - 10.3 mg/dL Final  . Total Protein 07/02/2014 7.9  6.5 - 8.1 g/dL Final  . Albumin 07/02/2014 4.2  3.5 - 5.0 g/dL Final  . AST 07/02/2014 28  15 - 41 U/L Final  . ALT 07/02/2014 23  17 - 63 U/L Final  . Alkaline Phosphatase 07/02/2014 74  38 - 126 U/L Final  . Total Bilirubin 07/02/2014 0.4  0.3 - 1.2 mg/dL Final  . GFR calc non Af Amer 07/02/2014 8* >60 mL/min Final  . GFR calc Af Amer 07/02/2014 9* >60 mL/min Final   Comment: (NOTE) The eGFR has been calculated using the CKD EPI equation. This calculation has not been validated in all clinical situations. eGFR's persistently <60 mL/min signify possible Chronic Kidney Disease.   . Anion gap 07/02/2014 18* 5 - 15 Final  . Lipase 07/02/2014 89* 22 - 51 U/L Final  . Glucose-Capillary  07/02/2014 173* 65 - 99 mg/dL Final  . Troponin I 07/02/2014 <0.03  <0.031 ng/mL Final   Comment:        NO INDICATION OF MYOCARDIAL INJURY.   Admission on 06/23/2014, Discharged on 06/25/2014  Component Date Value Ref Range Status  . WBC 06/23/2014 7.0  4.0 - 10.5 K/uL Final  . RBC 06/23/2014 2.95* 4.22 - 5.81 MIL/uL Final  . Hemoglobin 06/23/2014 10.5* 13.0 - 17.0 g/dL Final  . HCT 06/23/2014 30.8* 39.0 - 52.0 % Final  . MCV 06/23/2014 104.4* 78.0 - 100.0 fL Final  . MCH 06/23/2014 35.6* 26.0 - 34.0 pg Final  . MCHC 06/23/2014 34.1  30.0 - 36.0 g/dL Final  . RDW 06/23/2014 15.9* 11.5 - 15.5 % Final  . Platelets 06/23/2014 141* 150 - 400 K/uL Final  . Neutrophils Relative % 06/23/2014 76  43 - 77 % Final  . Neutro Abs 06/23/2014 5.4  1.7 - 7.7 K/uL Final  . Lymphocytes Relative 06/23/2014 14  12 - 46 % Final  . Lymphs Abs 06/23/2014 1.0  0.7 - 4.0 K/uL Final  . Monocytes Relative 06/23/2014 8  3 - 12 % Final  . Monocytes Absolute 06/23/2014 0.5  0.1 - 1.0 K/uL Final  . Eosinophils Relative 06/23/2014 1  0 - 5 % Final  . Eosinophils Absolute 06/23/2014 0.1  0.0 - 0.7 K/uL Final  . Basophils Relative 06/23/2014 1  0 - 1 % Final  . Basophils Absolute 06/23/2014 0.0  0.0 - 0.1 K/uL Final  . Sodium 06/23/2014 136  135 - 145 mmol/L Final  . Potassium 06/23/2014 3.4* 3.5 -  5.1 mmol/L Final  . Chloride 06/23/2014 90* 101 - 111 mmol/L Final  . CO2 06/23/2014 30  22 - 32 mmol/L Final  . Glucose, Bld 06/23/2014 142* 65 - 99 mg/dL Final  . BUN 06/23/2014 19  6 - 20 mg/dL Final  . Creatinine, Ser 06/23/2014 6.35* 0.61 - 1.24 mg/dL Final  . Calcium 06/23/2014 8.9  8.9 - 10.3 mg/dL Final  . Total Protein 06/23/2014 8.2* 6.5 - 8.1 g/dL Final  . Albumin 06/23/2014 4.2  3.5 - 5.0 g/dL Final  . AST 06/23/2014 19  15 - 41 U/L Final  . ALT 06/23/2014 20  17 - 63 U/L Final  . Alkaline Phosphatase 06/23/2014 79  38 - 126 U/L Final  . Total Bilirubin 06/23/2014 0.4  0.3 - 1.2 mg/dL Final  . GFR  calc non Af Amer 06/23/2014 9* >60 mL/min Final  . GFR calc Af Amer 06/23/2014 11* >60 mL/min Final   Comment: (NOTE) The eGFR has been calculated using the CKD EPI equation. This calculation has not been validated in all clinical situations. eGFR's persistently <60 mL/min signify possible Chronic Kidney Disease.   . Anion gap 06/23/2014 16* 5 - 15 Final  . Troponin I 06/23/2014 <0.03  <0.031 ng/mL Final   Comment:        NO INDICATION OF MYOCARDIAL INJURY.   Marland Kitchen aPTT 06/23/2014 32  24 - 37 seconds Final  . Prothrombin Time 06/23/2014 12.5  11.6 - 15.2 seconds Final  . INR 06/23/2014 0.91  0.00 - 1.49 Final  . Troponin I 06/23/2014 <0.03  <0.031 ng/mL Final   Comment:        NO INDICATION OF MYOCARDIAL INJURY.   . Troponin I 06/23/2014 <0.03  <0.031 ng/mL Final   Comment:        NO INDICATION OF MYOCARDIAL INJURY.   Marland Kitchen Specimen Description 06/23/2014 BLOOD RIGHT HAND   Final  . Special Requests 06/23/2014 BOTTLES DRAWN AEROBIC AND ANAEROBIC [place]   Final  . Culture 06/23/2014 NO GROWTH 5 DAYS   Final  . Report Status 06/23/2014 06/28/2014 FINAL   Final  . Specimen Description 06/23/2014 BLOOD RIGHT HAND   Final  . Special Requests 06/23/2014 BOTTLES DRAWN AEROBIC AND ANAEROBIC [place]   Final  . Culture 06/23/2014 NO GROWTH 5 DAYS   Final  . Report Status 06/23/2014 06/28/2014 FINAL   Final  . HIV Screen 4th Generation wRfx 06/23/2014 Non Reactive  Non Reactive Final   Comment: (NOTE) Performed At: [facility] [place], [place] [id number] Lindon Romp MD QA:[id number]   . Procalcitonin 06/23/2014 1.73   Final   Comment:        Interpretation: PCT > 0.5 ng/mL and <= 2 ng/mL: Systemic infection (sepsis) is possible, but other conditions are known to elevate PCT as well. (NOTE)         ICU PCT Algorithm               Non ICU PCT Algorithm    ----------------------------     ------------------------------         PCT < 0.25 ng/mL                  PCT < 0.1 ng/mL     Stopping of antibiotics            Stopping of antibiotics       strongly encouraged.  strongly encouraged.    ----------------------------     ------------------------------       PCT level decrease by               PCT < 0.25 ng/mL       >= 80% from peak PCT       OR PCT 0.25 - 0.5 ng/mL          Stopping of antibiotics                                             encouraged.     Stopping of antibiotics           encouraged.    ----------------------------     ------------------------------       PCT level decrease by              PCT >= 0.25 ng/mL       < 80% from peak PCT        AND PCT >= 0.5 ng/mL                                      Continuing antibiotics                                              encouraged.       Continuing antibiotics            encouraged.    ----------------------------     ------------------------------     PCT level increase compared          PCT > 0.5 ng/mL         with peak PCT AND          PCT >= 0.5 ng/mL             Escalation of antibiotics                                          strongly encouraged.      Escalation of antibiotics        strongly encouraged.   . WBC 06/24/2014 6.5  4.0 - 10.5 K/uL Final  . RBC 06/24/2014 2.66* 4.22 - 5.81 MIL/uL Final  . Hemoglobin 06/24/2014 9.1* 13.0 - 17.0 g/dL Final  . HCT 06/24/2014 28.2* 39.0 - 52.0 % Final  . MCV 06/24/2014 106.0* 78.0 - 100.0 fL Final  . MCH 06/24/2014 34.2* 26.0 - 34.0 pg Final  . MCHC 06/24/2014 32.3  30.0 - 36.0 g/dL Final  . RDW 06/24/2014 15.8* 11.5 - 15.5 % Final  . Platelets 06/24/2014 117* 150 - 400 K/uL Final   Comment: SPECIMEN CHECKED FOR CLOTS PLATELET COUNT CONFIRMED BY SMEAR   . Sodium 06/24/2014 135  135 - 145 mmol/L Final  . Potassium 06/24/2014 4.2  3.5 - 5.1 mmol/L Final   DELTA CHECK NOTED  . Chloride 06/24/2014 91* 101 - 111 mmol/L Final  . CO2 06/24/2014 28  22 - 32 mmol/L Final  . Glucose, Bld 06/24/2014 156* 65 - 99 mg/dL  Final  . BUN  06/24/2014 38* 6 - 20 mg/dL Final  . Creatinine, Ser 06/24/2014 9.90* 0.61 - 1.24 mg/dL Final  . Calcium 06/24/2014 8.0* 8.9 - 10.3 mg/dL Final  . GFR calc non Af Amer 06/24/2014 5* >60 mL/min Final  . GFR calc Af Amer 06/24/2014 6* >60 mL/min Final   Comment: (NOTE) The eGFR has been calculated using the CKD EPI equation. This calculation has not been validated in all clinical situations. eGFR's persistently <60 mL/min signify possible Chronic Kidney Disease.   . Anion gap 06/24/2014 16* 5 - 15 Final  . Troponin I 06/24/2014 <0.03  <0.031 ng/mL Final   Comment:        NO INDICATION OF MYOCARDIAL INJURY.   . Procalcitonin 06/25/2014 1.23   Final   Comment:        Interpretation: PCT > 0.5 ng/mL and <= 2 ng/mL: Systemic infection (sepsis) is possible, but other conditions are known to elevate PCT as well. (NOTE)         ICU PCT Algorithm               Non ICU PCT Algorithm    ----------------------------     ------------------------------         PCT < 0.25 ng/mL                 PCT < 0.1 ng/mL     Stopping of antibiotics            Stopping of antibiotics       strongly encouraged.               strongly encouraged.    ----------------------------     ------------------------------       PCT level decrease by               PCT < 0.25 ng/mL       >= 80% from peak PCT       OR PCT 0.25 - 0.5 ng/mL          Stopping of antibiotics                                             encouraged.     Stopping of antibiotics           encouraged.    ----------------------------     ------------------------------       PCT level decrease by              PCT >= 0.25 ng/mL       < 80% from peak PCT        AND PCT >= 0.5 ng/mL                                      Continuing antibiotics                                              encouraged.       Continuing antibiotics            encouraged.    ----------------------------     ------------------------------     PCT level  increase compared  PCT > 0.5 ng/mL         with peak PCT AND          PCT >= 0.5 ng/mL             Escalation of antibiotics                                          strongly encouraged.      Escalation of antibiotics        strongly encouraged.   . Sodium 06/25/2014 134* 135 - 145 mmol/L Final  . Potassium 06/25/2014 4.0  3.5 - 5.1 mmol/L Final  . Chloride 06/25/2014 93* 101 - 111 mmol/L Final  . CO2 06/25/2014 26  22 - 32 mmol/L Final  . Glucose, Bld 06/25/2014 125* 65 - 99 mg/dL Final  . BUN 06/25/2014 51* 6 - 20 mg/dL Final  . Creatinine, Ser 06/25/2014 12.78* 0.61 - 1.24 mg/dL Final  . Calcium 06/25/2014 7.5* 8.9 - 10.3 mg/dL Final  . Phosphorus 06/25/2014 10.6* 2.5 - 4.6 mg/dL Final  . Albumin 06/25/2014 3.4* 3.5 - 5.0 g/dL Final  . GFR calc non Af Amer 06/25/2014 4* >60 mL/min Final  . GFR calc Af Amer 06/25/2014 5* >60 mL/min Final   Comment: (NOTE) The eGFR has been calculated using the CKD EPI equation. This calculation has not been validated in all clinical situations. eGFR's persistently <60 mL/min signify possible Chronic Kidney Disease.   . Anion gap 06/25/2014 15  5 - 15 Final  . WBC 06/25/2014 4.1  4.0 - 10.5 K/uL Final  . RBC 06/25/2014 2.55* 4.22 - 5.81 MIL/uL Final  . Hemoglobin 06/25/2014 9.0* 13.0 - 17.0 g/dL Final  . HCT 06/25/2014 26.6* 39.0 - 52.0 % Final  . MCV 06/25/2014 104.3* 78.0 - 100.0 fL Final  . MCH 06/25/2014 35.3* 26.0 - 34.0 pg Final  . MCHC 06/25/2014 33.8  30.0 - 36.0 g/dL Final  . RDW 06/25/2014 15.4  11.5 - 15.5 % Final  . Platelets 06/25/2014 109* 150 - 400 K/uL Final   Comment: [facility] Outpatient Visit on 06/03/2014  Component Date Value Ref Range Status  . aPTT 06/03/2014 34  24 - 37 seconds Final  . WBC 06/03/2014 4.4  4.0 - 10.5 K/uL Final  . RBC 06/03/2014 2.48* 4.22 - 5.81 MIL/uL Final  . Hemoglobin 06/03/2014 8.5* 13.0 - 17.0 g/dL Final  . HCT 06/03/2014  25.8* 39.0 - 52.0 % Final  . MCV 06/03/2014 104.0* 78.0 - 100.0 fL Final  . MCH 06/03/2014 34.3* 26.0 - 34.0 pg Final  . MCHC 06/03/2014 32.9  30.0 - 36.0 g/dL Final  . RDW 06/03/2014 15.7* 11.5 - 15.5 % Final  . Platelets 06/03/2014 130* 150 - 400 K/uL Final  . Prothrombin Time 06/03/2014 13.3  11.6 - 15.2 seconds Final  . INR 06/03/2014 1.00  0.00 - 1.49 Final  . Sodium 06/03/2014 140  135 - 145 mmol/L Final  . Potassium 06/03/2014 3.7  3.5 - 5.1 mmol/L Final  . Chloride 06/03/2014 96* 101 - 111 mmol/L Final  . CO2 06/03/2014 26  22 - 32 mmol/L Final  . Glucose, Bld 06/03/2014 134* 65 - 99 mg/dL Final  . BUN 06/03/2014 32* 6 - 20 mg/dL Final  . Creatinine, Ser 06/03/2014 10.04* 0.61 - 1.24 mg/dL Final  . Calcium 06/03/2014  8.6* 8.9 - 10.3 mg/dL Final  . GFR calc non Af Amer 06/03/2014 5* >60 mL/min Final  . GFR calc Af Amer 06/03/2014 6* >60 mL/min Final   Comment: (NOTE) The eGFR has been calculated using the CKD EPI equation. This calculation has not been validated in all clinical situations. eGFR's persistently <60 mL/min signify possible Chronic Kidney Disease.   . Anion gap 06/03/2014 18* 5 - 15 Final  . Bone Marrow Exam 06/03/2014 SEE PATHOLOGY REPORT [id number]   Final  . Chromosome-Routine 06/03/2014 SEE SEPARATE REPORT   Final   Performed at [facility]  Admission on 04/06/2014, Discharged on 04/16/2014  No results displayed because visit has over 200 results.       Assessment/Plan  1. HCAP (healthcare-associated pneumonia) Resolved  2. Calculus of gallbladder without cholecystitis without obstruction Consider surgery due to recent problems suggestive of pancreatitis associated with an acute attack of his gallstones. - Ambulatory referral to General Surgery  3. Essential hypertension Controlled  4. Type 2 diabetes mellitus with diabetic nephropathy Controlled  5. Anemia in chronic renal disease Improved to 11.2 g percent about 10 days ago. Had been  as low as 9, but now is up to 11.2 g percent  6. End-stage renal disease on hemodialysis Continue dialysis  7. Memory loss Stable to slightly improved  8. Edema Improved

## 2014-07-14 DIAGNOSIS — N186 End stage renal disease: Secondary | ICD-10-CM | POA: Diagnosis not present

## 2014-07-14 DIAGNOSIS — D631 Anemia in chronic kidney disease: Secondary | ICD-10-CM | POA: Diagnosis not present

## 2014-07-15 ENCOUNTER — Encounter: Payer: Self-pay | Admitting: Gastroenterology

## 2014-07-16 DIAGNOSIS — D631 Anemia in chronic kidney disease: Secondary | ICD-10-CM | POA: Diagnosis not present

## 2014-07-16 DIAGNOSIS — N186 End stage renal disease: Secondary | ICD-10-CM | POA: Diagnosis not present

## 2014-07-19 DIAGNOSIS — N186 End stage renal disease: Secondary | ICD-10-CM | POA: Diagnosis not present

## 2014-07-19 DIAGNOSIS — D631 Anemia in chronic kidney disease: Secondary | ICD-10-CM | POA: Diagnosis not present

## 2014-07-21 DIAGNOSIS — D631 Anemia in chronic kidney disease: Secondary | ICD-10-CM | POA: Diagnosis not present

## 2014-07-21 DIAGNOSIS — N186 End stage renal disease: Secondary | ICD-10-CM | POA: Diagnosis not present

## 2014-07-22 DIAGNOSIS — Z992 Dependence on renal dialysis: Secondary | ICD-10-CM | POA: Diagnosis not present

## 2014-07-22 DIAGNOSIS — T8612 Kidney transplant failure: Secondary | ICD-10-CM | POA: Diagnosis not present

## 2014-07-22 DIAGNOSIS — N186 End stage renal disease: Secondary | ICD-10-CM | POA: Diagnosis not present

## 2014-07-23 DIAGNOSIS — D631 Anemia in chronic kidney disease: Secondary | ICD-10-CM | POA: Diagnosis not present

## 2014-07-23 DIAGNOSIS — N186 End stage renal disease: Secondary | ICD-10-CM | POA: Diagnosis not present

## 2014-07-23 DIAGNOSIS — D509 Iron deficiency anemia, unspecified: Secondary | ICD-10-CM | POA: Diagnosis not present

## 2014-07-23 DIAGNOSIS — N2581 Secondary hyperparathyroidism of renal origin: Secondary | ICD-10-CM | POA: Diagnosis not present

## 2014-07-26 DIAGNOSIS — N186 End stage renal disease: Secondary | ICD-10-CM | POA: Diagnosis not present

## 2014-07-26 DIAGNOSIS — D509 Iron deficiency anemia, unspecified: Secondary | ICD-10-CM | POA: Diagnosis not present

## 2014-07-26 DIAGNOSIS — N2581 Secondary hyperparathyroidism of renal origin: Secondary | ICD-10-CM | POA: Diagnosis not present

## 2014-07-26 DIAGNOSIS — D631 Anemia in chronic kidney disease: Secondary | ICD-10-CM | POA: Diagnosis not present

## 2014-07-28 DIAGNOSIS — D631 Anemia in chronic kidney disease: Secondary | ICD-10-CM | POA: Diagnosis not present

## 2014-07-28 DIAGNOSIS — N2581 Secondary hyperparathyroidism of renal origin: Secondary | ICD-10-CM | POA: Diagnosis not present

## 2014-07-28 DIAGNOSIS — D509 Iron deficiency anemia, unspecified: Secondary | ICD-10-CM | POA: Diagnosis not present

## 2014-07-28 DIAGNOSIS — N186 End stage renal disease: Secondary | ICD-10-CM | POA: Diagnosis not present

## 2014-07-30 DIAGNOSIS — D631 Anemia in chronic kidney disease: Secondary | ICD-10-CM | POA: Diagnosis not present

## 2014-07-30 DIAGNOSIS — D509 Iron deficiency anemia, unspecified: Secondary | ICD-10-CM | POA: Diagnosis not present

## 2014-07-30 DIAGNOSIS — N186 End stage renal disease: Secondary | ICD-10-CM | POA: Diagnosis not present

## 2014-07-30 DIAGNOSIS — N2581 Secondary hyperparathyroidism of renal origin: Secondary | ICD-10-CM | POA: Diagnosis not present

## 2014-08-02 DIAGNOSIS — D631 Anemia in chronic kidney disease: Secondary | ICD-10-CM | POA: Diagnosis not present

## 2014-08-02 DIAGNOSIS — N2581 Secondary hyperparathyroidism of renal origin: Secondary | ICD-10-CM | POA: Diagnosis not present

## 2014-08-02 DIAGNOSIS — N186 End stage renal disease: Secondary | ICD-10-CM | POA: Diagnosis not present

## 2014-08-02 DIAGNOSIS — D509 Iron deficiency anemia, unspecified: Secondary | ICD-10-CM | POA: Diagnosis not present

## 2014-08-03 ENCOUNTER — Ambulatory Visit: Payer: Medicare Other | Admitting: Internal Medicine

## 2014-08-03 ENCOUNTER — Other Ambulatory Visit: Payer: Self-pay | Admitting: General Surgery

## 2014-08-03 DIAGNOSIS — K8020 Calculus of gallbladder without cholecystitis without obstruction: Secondary | ICD-10-CM | POA: Diagnosis not present

## 2014-08-04 DIAGNOSIS — N186 End stage renal disease: Secondary | ICD-10-CM | POA: Diagnosis not present

## 2014-08-04 DIAGNOSIS — N2581 Secondary hyperparathyroidism of renal origin: Secondary | ICD-10-CM | POA: Diagnosis not present

## 2014-08-04 DIAGNOSIS — D509 Iron deficiency anemia, unspecified: Secondary | ICD-10-CM | POA: Diagnosis not present

## 2014-08-04 DIAGNOSIS — D631 Anemia in chronic kidney disease: Secondary | ICD-10-CM | POA: Diagnosis not present

## 2014-08-06 DIAGNOSIS — D631 Anemia in chronic kidney disease: Secondary | ICD-10-CM | POA: Diagnosis not present

## 2014-08-06 DIAGNOSIS — N2581 Secondary hyperparathyroidism of renal origin: Secondary | ICD-10-CM | POA: Diagnosis not present

## 2014-08-06 DIAGNOSIS — N186 End stage renal disease: Secondary | ICD-10-CM | POA: Diagnosis not present

## 2014-08-06 DIAGNOSIS — D509 Iron deficiency anemia, unspecified: Secondary | ICD-10-CM | POA: Diagnosis not present

## 2014-08-09 DIAGNOSIS — D509 Iron deficiency anemia, unspecified: Secondary | ICD-10-CM | POA: Diagnosis not present

## 2014-08-09 DIAGNOSIS — D631 Anemia in chronic kidney disease: Secondary | ICD-10-CM | POA: Diagnosis not present

## 2014-08-09 DIAGNOSIS — N2581 Secondary hyperparathyroidism of renal origin: Secondary | ICD-10-CM | POA: Diagnosis not present

## 2014-08-09 DIAGNOSIS — N186 End stage renal disease: Secondary | ICD-10-CM | POA: Diagnosis not present

## 2014-08-11 DIAGNOSIS — D631 Anemia in chronic kidney disease: Secondary | ICD-10-CM | POA: Diagnosis not present

## 2014-08-11 DIAGNOSIS — D509 Iron deficiency anemia, unspecified: Secondary | ICD-10-CM | POA: Diagnosis not present

## 2014-08-11 DIAGNOSIS — N186 End stage renal disease: Secondary | ICD-10-CM | POA: Diagnosis not present

## 2014-08-11 DIAGNOSIS — N2581 Secondary hyperparathyroidism of renal origin: Secondary | ICD-10-CM | POA: Diagnosis not present

## 2014-08-12 ENCOUNTER — Encounter: Payer: Self-pay | Admitting: Cardiovascular Disease

## 2014-08-13 DIAGNOSIS — D509 Iron deficiency anemia, unspecified: Secondary | ICD-10-CM | POA: Diagnosis not present

## 2014-08-13 DIAGNOSIS — D631 Anemia in chronic kidney disease: Secondary | ICD-10-CM | POA: Diagnosis not present

## 2014-08-13 DIAGNOSIS — N2581 Secondary hyperparathyroidism of renal origin: Secondary | ICD-10-CM | POA: Diagnosis not present

## 2014-08-13 DIAGNOSIS — N186 End stage renal disease: Secondary | ICD-10-CM | POA: Diagnosis not present

## 2014-08-16 DIAGNOSIS — N2581 Secondary hyperparathyroidism of renal origin: Secondary | ICD-10-CM | POA: Diagnosis not present

## 2014-08-16 DIAGNOSIS — N186 End stage renal disease: Secondary | ICD-10-CM | POA: Diagnosis not present

## 2014-08-16 DIAGNOSIS — D631 Anemia in chronic kidney disease: Secondary | ICD-10-CM | POA: Diagnosis not present

## 2014-08-16 DIAGNOSIS — D509 Iron deficiency anemia, unspecified: Secondary | ICD-10-CM | POA: Diagnosis not present

## 2014-08-18 DIAGNOSIS — D631 Anemia in chronic kidney disease: Secondary | ICD-10-CM | POA: Diagnosis not present

## 2014-08-18 DIAGNOSIS — N2581 Secondary hyperparathyroidism of renal origin: Secondary | ICD-10-CM | POA: Diagnosis not present

## 2014-08-18 DIAGNOSIS — N186 End stage renal disease: Secondary | ICD-10-CM | POA: Diagnosis not present

## 2014-08-18 DIAGNOSIS — D509 Iron deficiency anemia, unspecified: Secondary | ICD-10-CM | POA: Diagnosis not present

## 2014-08-20 DIAGNOSIS — N186 End stage renal disease: Secondary | ICD-10-CM | POA: Diagnosis not present

## 2014-08-20 DIAGNOSIS — D509 Iron deficiency anemia, unspecified: Secondary | ICD-10-CM | POA: Diagnosis not present

## 2014-08-20 DIAGNOSIS — D631 Anemia in chronic kidney disease: Secondary | ICD-10-CM | POA: Diagnosis not present

## 2014-08-20 DIAGNOSIS — N2581 Secondary hyperparathyroidism of renal origin: Secondary | ICD-10-CM | POA: Diagnosis not present

## 2014-08-22 DIAGNOSIS — N186 End stage renal disease: Secondary | ICD-10-CM | POA: Diagnosis not present

## 2014-08-22 DIAGNOSIS — Z992 Dependence on renal dialysis: Secondary | ICD-10-CM | POA: Diagnosis not present

## 2014-08-22 DIAGNOSIS — T8612 Kidney transplant failure: Secondary | ICD-10-CM | POA: Diagnosis not present

## 2014-08-23 DIAGNOSIS — D509 Iron deficiency anemia, unspecified: Secondary | ICD-10-CM | POA: Diagnosis not present

## 2014-08-23 DIAGNOSIS — N2581 Secondary hyperparathyroidism of renal origin: Secondary | ICD-10-CM | POA: Diagnosis not present

## 2014-08-23 DIAGNOSIS — D631 Anemia in chronic kidney disease: Secondary | ICD-10-CM | POA: Diagnosis not present

## 2014-08-23 DIAGNOSIS — N186 End stage renal disease: Secondary | ICD-10-CM | POA: Diagnosis not present

## 2014-08-25 ENCOUNTER — Emergency Department (HOSPITAL_COMMUNITY): Payer: Medicare Other

## 2014-08-25 ENCOUNTER — Encounter (HOSPITAL_COMMUNITY): Payer: Self-pay | Admitting: Emergency Medicine

## 2014-08-25 ENCOUNTER — Emergency Department (HOSPITAL_COMMUNITY)
Admission: EM | Admit: 2014-08-25 | Discharge: 2014-08-25 | Disposition: A | Discharge status: Home / Self Care | Payer: Medicare Other | Attending: Emergency Medicine | Admitting: Emergency Medicine

## 2014-08-25 DIAGNOSIS — Z872 Personal history of diseases of the skin and subcutaneous tissue: Secondary | ICD-10-CM | POA: Diagnosis not present

## 2014-08-25 DIAGNOSIS — N400 Enlarged prostate without lower urinary tract symptoms: Secondary | ICD-10-CM | POA: Diagnosis not present

## 2014-08-25 DIAGNOSIS — D509 Iron deficiency anemia, unspecified: Secondary | ICD-10-CM | POA: Diagnosis not present

## 2014-08-25 DIAGNOSIS — M542 Cervicalgia: Secondary | ICD-10-CM | POA: Diagnosis not present

## 2014-08-25 DIAGNOSIS — E785 Hyperlipidemia, unspecified: Secondary | ICD-10-CM | POA: Diagnosis not present

## 2014-08-25 DIAGNOSIS — Z9981 Dependence on supplemental oxygen: Secondary | ICD-10-CM | POA: Diagnosis not present

## 2014-08-25 DIAGNOSIS — W19XXXA Unspecified fall, initial encounter: Secondary | ICD-10-CM

## 2014-08-25 DIAGNOSIS — E1165 Type 2 diabetes mellitus with hyperglycemia: Secondary | ICD-10-CM | POA: Insufficient documentation

## 2014-08-25 DIAGNOSIS — Z8709 Personal history of other diseases of the respiratory system: Secondary | ICD-10-CM | POA: Diagnosis not present

## 2014-08-25 DIAGNOSIS — G4730 Sleep apnea, unspecified: Secondary | ICD-10-CM | POA: Insufficient documentation

## 2014-08-25 DIAGNOSIS — Y998 Other external cause status: Secondary | ICD-10-CM | POA: Diagnosis not present

## 2014-08-25 DIAGNOSIS — M79631 Pain in right forearm: Secondary | ICD-10-CM | POA: Diagnosis not present

## 2014-08-25 DIAGNOSIS — W108XXA Fall (on) (from) other stairs and steps, initial encounter: Secondary | ICD-10-CM | POA: Diagnosis not present

## 2014-08-25 DIAGNOSIS — F419 Anxiety disorder, unspecified: Secondary | ICD-10-CM | POA: Diagnosis not present

## 2014-08-25 DIAGNOSIS — M7989 Other specified soft tissue disorders: Secondary | ICD-10-CM | POA: Diagnosis not present

## 2014-08-25 DIAGNOSIS — N2581 Secondary hyperparathyroidism of renal origin: Secondary | ICD-10-CM | POA: Diagnosis not present

## 2014-08-25 DIAGNOSIS — N186 End stage renal disease: Secondary | ICD-10-CM | POA: Diagnosis not present

## 2014-08-25 DIAGNOSIS — S4991XA Unspecified injury of right shoulder and upper arm, initial encounter: Secondary | ICD-10-CM | POA: Insufficient documentation

## 2014-08-25 DIAGNOSIS — S3991XA Unspecified injury of abdomen, initial encounter: Secondary | ICD-10-CM | POA: Insufficient documentation

## 2014-08-25 DIAGNOSIS — Z8619 Personal history of other infectious and parasitic diseases: Secondary | ICD-10-CM | POA: Insufficient documentation

## 2014-08-25 DIAGNOSIS — E559 Vitamin D deficiency, unspecified: Secondary | ICD-10-CM | POA: Diagnosis not present

## 2014-08-25 DIAGNOSIS — S0990XA Unspecified injury of head, initial encounter: Secondary | ICD-10-CM | POA: Diagnosis not present

## 2014-08-25 DIAGNOSIS — Z992 Dependence on renal dialysis: Secondary | ICD-10-CM | POA: Insufficient documentation

## 2014-08-25 DIAGNOSIS — M79632 Pain in left forearm: Secondary | ICD-10-CM | POA: Diagnosis not present

## 2014-08-25 DIAGNOSIS — Z87891 Personal history of nicotine dependence: Secondary | ICD-10-CM | POA: Diagnosis not present

## 2014-08-25 DIAGNOSIS — Z7982 Long term (current) use of aspirin: Secondary | ICD-10-CM | POA: Diagnosis not present

## 2014-08-25 DIAGNOSIS — R0602 Shortness of breath: Secondary | ICD-10-CM | POA: Diagnosis not present

## 2014-08-25 DIAGNOSIS — G4700 Insomnia, unspecified: Secondary | ICD-10-CM | POA: Diagnosis not present

## 2014-08-25 DIAGNOSIS — S29001A Unspecified injury of muscle and tendon of front wall of thorax, initial encounter: Secondary | ICD-10-CM | POA: Diagnosis not present

## 2014-08-25 DIAGNOSIS — Y9289 Other specified places as the place of occurrence of the external cause: Secondary | ICD-10-CM | POA: Diagnosis not present

## 2014-08-25 DIAGNOSIS — S39012A Strain of muscle, fascia and tendon of lower back, initial encounter: Secondary | ICD-10-CM | POA: Insufficient documentation

## 2014-08-25 DIAGNOSIS — Z940 Kidney transplant status: Secondary | ICD-10-CM | POA: Insufficient documentation

## 2014-08-25 DIAGNOSIS — I120 Hypertensive chronic kidney disease with stage 5 chronic kidney disease or end stage renal disease: Secondary | ICD-10-CM | POA: Insufficient documentation

## 2014-08-25 DIAGNOSIS — Z79899 Other long term (current) drug therapy: Secondary | ICD-10-CM | POA: Insufficient documentation

## 2014-08-25 DIAGNOSIS — Z862 Personal history of diseases of the blood and blood-forming organs and certain disorders involving the immune mechanism: Secondary | ICD-10-CM | POA: Diagnosis not present

## 2014-08-25 DIAGNOSIS — M545 Low back pain: Secondary | ICD-10-CM | POA: Diagnosis not present

## 2014-08-25 DIAGNOSIS — Y9389 Activity, other specified: Secondary | ICD-10-CM | POA: Diagnosis not present

## 2014-08-25 DIAGNOSIS — S199XXA Unspecified injury of neck, initial encounter: Secondary | ICD-10-CM | POA: Diagnosis not present

## 2014-08-25 DIAGNOSIS — G4733 Obstructive sleep apnea (adult) (pediatric): Secondary | ICD-10-CM | POA: Insufficient documentation

## 2014-08-25 DIAGNOSIS — D631 Anemia in chronic kidney disease: Secondary | ICD-10-CM | POA: Diagnosis not present

## 2014-08-25 DIAGNOSIS — G8929 Other chronic pain: Secondary | ICD-10-CM | POA: Diagnosis not present

## 2014-08-25 DIAGNOSIS — S59912A Unspecified injury of left forearm, initial encounter: Secondary | ICD-10-CM | POA: Diagnosis not present

## 2014-08-25 DIAGNOSIS — Z8719 Personal history of other diseases of the digestive system: Secondary | ICD-10-CM | POA: Diagnosis not present

## 2014-08-25 DIAGNOSIS — M25511 Pain in right shoulder: Secondary | ICD-10-CM | POA: Diagnosis not present

## 2014-08-25 DIAGNOSIS — R51 Headache: Secondary | ICD-10-CM | POA: Diagnosis not present

## 2014-08-25 DIAGNOSIS — S5012XA Contusion of left forearm, initial encounter: Secondary | ICD-10-CM | POA: Insufficient documentation

## 2014-08-25 DIAGNOSIS — S299XXA Unspecified injury of thorax, initial encounter: Secondary | ICD-10-CM | POA: Diagnosis not present

## 2014-08-25 DIAGNOSIS — S59911A Unspecified injury of right forearm, initial encounter: Secondary | ICD-10-CM | POA: Diagnosis not present

## 2014-08-25 DIAGNOSIS — S3992XA Unspecified injury of lower back, initial encounter: Secondary | ICD-10-CM | POA: Diagnosis not present

## 2014-08-25 LAB — BASIC METABOLIC PANEL
ANION GAP: 12 (ref 5–15)
BUN: 24 mg/dL — ABNORMAL HIGH (ref 6–20)
CALCIUM: 8.5 mg/dL — AB (ref 8.9–10.3)
CHLORIDE: 97 mmol/L — AB (ref 101–111)
CO2: 29 mmol/L (ref 22–32)
Creatinine, Ser: 8.09 mg/dL — ABNORMAL HIGH (ref 0.61–1.24)
GFR calc Af Amer: 8 mL/min — ABNORMAL LOW (ref >60)
GFR calc non Af Amer: 7 mL/min — ABNORMAL LOW (ref >60)
Glucose, Bld: 122 mg/dL — ABNORMAL HIGH (ref 65–99)
Potassium: 3.6 mmol/L (ref 3.5–5.1)
Sodium: 138 mmol/L (ref 135–145)

## 2014-08-25 LAB — CBC WITH DIFFERENTIAL/PLATELET
BASOS ABS: 0 10*3/uL (ref 0.0–0.1)
Basophils Relative: 1 % (ref 0–1)
Eosinophils Absolute: 0.1 10*3/uL (ref 0.0–0.7)
Eosinophils Relative: 2 % (ref 0–5)
HEMATOCRIT: 35.1 % — AB (ref 39.0–52.0)
HEMOGLOBIN: 11.6 g/dL — AB (ref 13.0–17.0)
LYMPHS PCT: 22 % (ref 12–46)
Lymphs Abs: 0.9 10*3/uL (ref 0.7–4.0)
MCH: 33.2 pg (ref 26.0–34.0)
MCHC: 33 g/dL (ref 30.0–36.0)
MCV: 100.6 fL — ABNORMAL HIGH (ref 78.0–100.0)
MONOS PCT: 10 % (ref 3–12)
Monocytes Absolute: 0.4 10*3/uL (ref 0.1–1.0)
NEUTROS ABS: 2.7 10*3/uL (ref 1.7–7.7)
Neutrophils Relative %: 65 % (ref 43–77)
PLATELETS: 110 10*3/uL — AB (ref 150–400)
RBC: 3.49 MIL/uL — AB (ref 4.22–5.81)
RDW: 14.3 % (ref 11.5–15.5)
WBC: 4.1 10*3/uL (ref 4.0–10.5)

## 2014-08-25 MED ORDER — ONDANSETRON 8 MG PO TBDP
8.0000 mg | ORAL_TABLET | Freq: Once | ORAL | Status: AC
Start: 2014-08-25 — End: 2014-08-25
  Administered 2014-08-25: 8 mg via ORAL

## 2014-08-25 MED ORDER — OXYCODONE-ACETAMINOPHEN 5-325 MG PO TABS: 1.0000 | ORAL_TABLET | Freq: Three times a day (TID) | ORAL | Status: DC | PRN

## 2014-08-25 MED ORDER — HYDROMORPHONE HCL 2 MG/ML IJ SOLN
2.0000 mg | Freq: Once | INTRAMUSCULAR | Status: AC
  Administered 2014-08-25: 2 mg via INTRAMUSCULAR
  Filled 2014-08-25: qty 1

## 2014-08-25 MED ORDER — ONDANSETRON 8 MG PO TBDP
ORAL_TABLET | ORAL | Status: AC
  Administered 2014-08-25: 8 mg via ORAL
  Filled 2014-08-25: qty 1

## 2014-08-25 NOTE — ED Notes (Signed)
Pt became very nauseated and start vomiting on way out the door, ODT zofran given once verbal order from EDP given

## 2014-08-25 NOTE — ED Notes (Signed)
Pt verbalized understanding of no driving and to use caution within 4 hours of taking pain meds due to meds cause drowsiness 

## 2014-08-25 NOTE — Discharge Instructions (Signed)
Follow-up with your dialysis as scheduled. CT of head and neck was negative. X-ray of the shoulder was negative. X-ray of the back was negative an x-ray of both forearms without any bony injuries. Referral information to orthopedics provided if needed. Use a sling for the right shoulder as needed. Return for any new or worse symptoms.

## 2014-08-25 NOTE — ED Provider Notes (Signed)
CSN: [id number]     Arrival date & time 08/25/14  1057 History  This chart was scribed for Fredia Sorrow, MD by Starleen Arms, ED Scribe. This patient was seen in room APA15/APA15 and the patient's care was started at 12:40 PM.   Chief Complaint  Patient presents with  . Fall   The history is provided by the patient. No language interpreter was used.   HPI Comments: Kirk [name] is a 54 y.o. male on MWF dialysis who presents to the Emergency Department complaining of a mechanical fall down three steps this morning.  The patient reports this occurred prior to dialysis but he was still able to complete 3 hours of dialysis this morning before coming to the ED.  He complains currently of pain in the lower back and elbows onset immediately after the fall and gradual onset headache and neck pain.    Past Medical History  Diagnosis Date  . Anxiety   . Chronic headaches   . Chronic kidney disease (CKD)   . Dialysis care 02/06/2010  . HTN (hypertension)   . Sleep apnea     wears the CPAP  . Type II or unspecified type diabetes mellitus with renal manifestations, uncontrolled   . Shortness of breath   . Impotence of organic origin   . Other malaise and fatigue   . Debility, unspecified   . Cellulitis and abscess of trunk   . Kidney replaced by transplant   . Acute edema of lung, unspecified   . Unspecified constipation   . Other chronic nonalcoholic liver disease   . Localization-related (focal) (partial) epilepsy and epileptic syndromes with complex partial seizures, without mention of intractable epilepsy   . Tension headache   . Dermatophytosis of the body   . Gout, unspecified   . Pain in joint, upper arm   . Pain in joint, lower leg   . Other nonspecific abnormal serum enzyme levels   . Essential hypertension, benign   . Acute, but ill-defined, cerebrovascular disease   . Memory loss   . Hypotension, unspecified   . Hypertrophy of prostate without urinary obstruction and other  lower urinary tract symptoms (LUTS)   . Renal dialysis status(V45.11) 02/05/2010    restarted 01/02/13 ofter renal trransplant failure  . End stage renal disease   . Insomnia, unspecified   . Secondary hyperparathyroidism (of renal origin)   . Lumbago   . Unspecified vitamin D deficiency   . Carpal tunnel syndrome   . 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)   . Unspecified hereditary and idiopathic peripheral neuropathy   . Other and unspecified hyperlipidemia   . Unspecified essential hypertension   . Arteriovenous fistula, acquired   . Edema   . Vasectomy status   . Obstructive sleep apnea (adult) (pediatric)     CPAP  . Cholelithiasis 07/13/2014   Past Surgical History  Procedure Laterality Date  . Inner ear surgery  1973    for deafness at age 44  . Colonoscopy    . Dialysis shunt placement    . Kidney transplant  08/17/2011    [facility]   . Placement of fistula at [name] vein specialist    . Left heart catheterization with coronary angiogram N/A 03/21/2011    Procedure: LEFT HEART CATHETERIZATION WITH CORONARY ANGIOGRAM;  Surgeon: [name], MD;  Location: [facility] CATH LAB;  Service: Cardiovascular;  Laterality: N/A;  . Cervical disc surgery      for spinal stenosis  . Nephrectomy  removed transplaned kidney   Family History  Problem Relation Age of Onset  . Adopted: Yes  . Colon cancer Neg Hx   . Esophageal cancer Neg Hx   . Rectal cancer Neg Hx   . Stomach cancer Neg Hx    History  Substance Use Topics  . Smoking status: Former Smoker -- 28 years    Types: Cigarettes    Quit date: 08/04/2011  . Smokeless tobacco: Never Used     Comment: 1/2 pack every 4 days  . Alcohol Use: No    Review of Systems  Constitutional: Negative for fever and chills.  HENT: Negative for rhinorrhea and sore throat.   Eyes: Negative for visual disturbance.  Respiratory: Positive for shortness of breath. Negative for cough.   Cardiovascular: Positive for chest pain.  Negative for leg swelling.  Gastrointestinal: Positive for abdominal pain. Negative for nausea and vomiting.  Genitourinary: Negative for dysuria and hematuria.  Musculoskeletal: Positive for arthralgias and neck pain. Negative for back pain.  Skin: Negative for rash.  Neurological: Positive for headaches.  Hematological: Bruises/bleeds easily.  Psychiatric/Behavioral: Negative for confusion.      Allergies  Codeine  Home Medications   Prior to Admission medications   Medication Sig Start Date End Date Taking? Authorizing Provider  acetaminophen (TYLENOL) 500 MG tablet Take 1,000 mg by mouth every 6 (six) hours as needed for moderate pain.   Yes Historical Provider, MD  albuterol (PROVENTIL) (2.5 MG/3ML) 0.083% nebulizer solution Take 3 mLs (2.5 mg total) by nebulization every 6 (six) hours as needed for wheezing. 04/16/14  Yes Kathie Dike, MD  albuterol-ipratropium (COMBIVENT) 18-103 MCG/ACT inhaler Inhale 2 puffs into the lungs every 6 (six) hours as needed for wheezing. 07/08/12  Yes Lauree Chandler, NP  amLODipine (NORVASC) 10 MG tablet Take 1 tablet (10 mg total) by mouth daily. 03/29/14  Yes Estill Dooms, MD  aspirin EC 81 MG tablet Take 81 mg by mouth daily.   Yes Historical Provider, MD  b complex-vitamin c-folic acid (NEPHRO-VITE) 0.8 MG TABS tablet TAKE 1 TABLET BY MOUTH EVERY DAY 05/25/14  Yes Estill Dooms, MD  calcium acetate, Phos Binder, (PHOSLYRA) 667 MG/5ML SOLN Take 2,001 mg by mouth 3 (three) times daily with meals. And 667 mg with snack   Yes Historical Provider, MD  carvedilol (COREG) 25 MG tablet Take 1 tablet (25 mg total) by mouth 2 (two) times daily. Take one tablet in the morning and one tablet in the evening 03/29/14  Yes Estill Dooms, MD  Cholecalciferol 5000 UNITS TABS Take 5,000 Units by mouth every morning.   Yes Historical Provider, MD  diphenhydrAMINE (BENADRYL) 25 MG tablet Take 50 mg by mouth 2 (two) times daily.    Yes Historical Provider, MD   doxazosin (CARDURA) 4 MG tablet Take 0.5 tablet  by mouth in the am and 0.5 tablet by mouth in the pm to regulate blood pressure. Patient taking differently: Take 2 mg by mouth 2 (two) times daily. Take 0.5 tablet  by mouth in the am and 0.5 tablet by mouth in the pm to regulate blood pressure. 04/01/14  Yes Tiffany L Reed, DO  oxyCODONE-acetaminophen (PERCOCET/ROXICET) 5-325 MG per tablet Take 2 tablets by mouth every 8 (eight) hours as needed for severe pain. 07/02/14  Yes Fredia Sorrow, MD  pravastatin (PRAVACHOL) 40 MG tablet Take 1 tablet (40 mg total) by mouth every evening. 03/29/14  Yes Estill Dooms, MD  promethazine (PHENERGAN) 25 MG tablet Take  1 tablet (25 mg total) by mouth every 8 (eight) hours as needed for nausea or vomiting. 07/13/14  Yes Estill Dooms, MD  traMADol (ULTRAM) 50 MG tablet One up to 4 times daly if needed for pain Patient taking differently: Take 50 mg by mouth every 6 (six) hours as needed. One up to 4 times daly if needed for pain 06/16/14  Yes Estill Dooms, MD  oxyCODONE-acetaminophen (PERCOCET/ROXICET) 5-325 MG per tablet Take 1-2 tablets by mouth every 6 (six) hours as needed for moderate pain. Patient not taking: Reported on 08/25/2014 06/25/14   Kathie Dike, MD  oxyCODONE-acetaminophen (PERCOCET/ROXICET) 5-325 MG per tablet Take 1-2 tablets by mouth every 8 (eight) hours as needed for severe pain. 08/25/14   Fredia Sorrow, MD   BP 164/75 mmHg  Pulse 66  Temp(Src) 98.5 F (36.9 C) (Oral)  Resp 20  Ht 5' 7.5" (1.715 m)  Wt 233 lb (105.688 kg)  BMI 35.93 kg/m2  SpO2 97% Physical Exam  Constitutional: He is oriented to person, place, and time. He appears well-developed and well-nourished. No distress.  HENT:  Head: Normocephalic and atraumatic.  Pupils normal.  Sclera clear.  Eyes tracking normal.   Eyes: Conjunctivae and EOM are normal.  Neck: Neck supple. No tracheal deviation present.  Cardiovascular: Normal rate and regular rhythm.   Strong radial  pulses.  Pulmonary/Chest: Effort normal. No respiratory distress.  Lungs CTA bilaterally.   Abdominal: Bowel sounds are normal. There is no tenderness.  Musculoskeletal: Normal range of motion.  No leg swelling.    Right/left forearm: bruising on the ulnar side - almost entire forearm area  Left CVA area bruise  Neurological: He is alert and oriented to person, place, and time.  Skin: Skin is warm and dry.  AV fistula in left forearm.     Psychiatric: He has a normal mood and affect. His behavior is normal.  Nursing note and vitals reviewed.   ED Course  Procedures (including critical care time)  DIAGNOSTIC STUDIES: Oxygen Saturation is 99% on RA, normal by my interpretation.    COORDINATION OF CARE:   Labs Review Labs Reviewed  CBC WITH DIFFERENTIAL/PLATELET - Abnormal; Notable for the following:    RBC 3.49 (*)    Hemoglobin 11.6 (*)    HCT 35.1 (*)    MCV 100.6 (*)    Platelets 110 (*)    All other components within normal limits  BASIC METABOLIC PANEL - Abnormal; Notable for the following:    Chloride 97 (*)    Glucose, Bld 122 (*)    BUN 24 (*)    Creatinine, Ser 8.09 (*)    Calcium 8.5 (*)    GFR calc non Af Amer 7 (*)    GFR calc Af Amer 8 (*)    All other components within normal limits   Results for orders placed or performed during the hospital encounter of 08/25/14  CBC with Differential/Platelet  Result Value Ref Range   WBC 4.1 4.0 - 10.5 K/uL   RBC 3.49 (L) 4.22 - 5.81 MIL/uL   Hemoglobin 11.6 (L) 13.0 - 17.0 g/dL   HCT 35.1 (L) 39.0 - 52.0 %   MCV 100.6 (H) 78.0 - 100.0 fL   MCH 33.2 26.0 - 34.0 pg   MCHC 33.0 30.0 - 36.0 g/dL   RDW 14.3 11.5 - 15.5 %   Platelets 110 (L) 150 - 400 K/uL   Neutrophils Relative % 65 43 - 77 %   Lymphocytes Relative  22 12 - 46 %   Monocytes Relative 10 3 - 12 %   Eosinophils Relative 2 0 - 5 %   Basophils Relative 1 0 - 1 %   Neutro Abs 2.7 1.7 - 7.7 K/uL   Lymphs Abs 0.9 0.7 - 4.0 K/uL   Monocytes Absolute  0.4 0.1 - 1.0 K/uL   Eosinophils Absolute 0.1 0.0 - 0.7 K/uL   Basophils Absolute 0.0 0.0 - 0.1 K/uL   WBC Morphology WHITE COUNT CONFIRMED ON SMEAR    Smear Review PLATELET COUNT CONFIRMED BY SMEAR   Basic metabolic panel  Result Value Ref Range   Sodium 138 135 - 145 mmol/L   Potassium 3.6 3.5 - 5.1 mmol/L   Chloride 97 (L) 101 - 111 mmol/L   CO2 29 22 - 32 mmol/L   Glucose, Bld 122 (H) 65 - 99 mg/dL   BUN 24 (H) 6 - 20 mg/dL   Creatinine, Ser 8.09 (H) 0.61 - 1.24 mg/dL   Calcium 8.5 (L) 8.9 - 10.3 mg/dL   GFR calc non Af Amer 7 (L) >60 mL/min   GFR calc Af Amer 8 (L) >60 mL/min   Anion gap 12 5 - 15     Imaging Review Dg Chest 2 View  08/25/2014   CLINICAL DATA:  Fall earlier today. Kidney transplant patient on dialysis. Initial encounter.  EXAM: CHEST  2 VIEW  COMPARISON:  07/02/2014  FINDINGS: The heart size and mediastinal contours are within normal limits. Both lungs are clear. Previous nodular opacity in the right midlung on prior exam is no longer visualized. No evidence of pneumothorax or pleural effusion. Cervical spine fusion hardware again noted.  IMPRESSION: No active cardiopulmonary disease.   Electronically Signed   By: Earle Gell M.D.   On: 08/25/2014 13:31   Dg Lumbar Spine Complete  08/25/2014   CLINICAL DATA:  Fall earlier today. Low back pain. Initial encounter.  EXAM: LUMBAR SPINE - COMPLETE 4+ VIEW  COMPARISON:  None.  FINDINGS: There is no evidence of lumbar spine fracture. Alignment is normal. Mild degenerative disc disease is seen at L3-4. Other intervertebral disc spaces are maintained. No other significant bone abnormality identified.  IMPRESSION: No acute findings.  Mild L3-4 degenerative disc disease.   Electronically Signed   By: Earle Gell M.D.   On: 08/25/2014 13:29   Dg Shoulder Right  08/25/2014   CLINICAL DATA:  Pain following fall  EXAM: RIGHT SHOULDER - 2+ VIEW  COMPARISON:  None.  FINDINGS: Oblique, Y scapular, and axillary images obtained. There is  no fracture or dislocation. There is osteoarthritic change in the acromioclavicular joint. The glenohumeral joint appears normal. No erosive change or intra-articular calcification.  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 No fracture or dislocation.   Electronically Signed   By: Lowella Grip III M.D.   On: 08/25/2014 13:34   Dg Forearm Left  08/25/2014   CLINICAL DATA:  Pain following fall  EXAM: LEFT FOREARM - 2 VIEW  COMPARISON:  None.  FINDINGS: Frontal and lateral views were obtained. There are surgical clips volar to the distal radius. There are apparent soft tissue defects lateral and volar to the radius. No fracture or dislocation. Joint spaces appear intact. No erosive change.  IMPRESSION: Lucencies in the soft tissues volar and lateral to the radius. Question traumatic etiology. Surgical clips volar to the distal radius. No acute fracture or dislocation. No appreciable joint space narrowing or erosion.   Electronically Signed   By:  Lowella Grip III M.D.   On: 08/25/2014 13:30   Dg Forearm Right  08/25/2014   CLINICAL DATA:  Mid forearm pain.  Fall today.  Initial encounter.  EXAM: RIGHT FOREARM - 2 VIEW  COMPARISON:  None.  FINDINGS: There is soft tissue swelling over the dorsal aspect of the RIGHT forearm. There is no fracture. No radiopaque foreign body. Radius and ulna appear intact.  IMPRESSION: Soft tissue swelling over the dorsal RIGHT forearm.   Electronically Signed   By: Dereck Ligas M.D.   On: 08/25/2014 13:33   Ct Head Wo Contrast  08/25/2014   CLINICAL DATA:  Fall today. RIGHT-sided neck and shoulder pain. Headache. No loss of consciousness.  EXAM: CT HEAD WITHOUT CONTRAST  CT CERVICAL SPINE WITHOUT CONTRAST  TECHNIQUE: Multidetector CT imaging of the head and cervical spine was performed following the standard protocol without intravenous contrast. Multiplanar CT image reconstructions of the cervical spine were also generated.  COMPARISON:  MRI  06/22/2010.  CT 06/22/2010.  FINDINGS: CT HEAD FINDINGS  No mass lesion, mass effect, midline shift, hydrocephalus, hemorrhage. No territorial ischemia or acute infarction. The calvarium is intact. The paranasal sinuses appear normal. Intracranial atherosclerosis is present.  CT CERVICAL SPINE FINDINGS  Alignment: Straightening of the normal cervical lordosis. Between 1 mm and 2 mm anterolisthesis of C3 on C4 and C4 on C5. C5-C6 ACDF.  Craniocervical junction: Atlantodental degenerative disease. Craniocervical alignment appears normal. Odontoid intact. C1 ring intact.  Vertebrae: No aggressive osseous lesions.  Negative for fracture.  Paraspinal soft tissues: Within normal limits. Mild carotid atherosclerosis.  Lung apices: Normal.  Ankylosis of the LEFT C2-C3 facet joints with solid bridging bone on the LEFT and a small amount of bridging bone on the RIGHT.  C5-C6 ACDF. Probable small amount of bridging bone anteriorly. No findings to suggest pseudoarthrosis. Multilevel cervical degenerative disc and facet disease. Severe LEFT-sided C3-C4 facet disease is present compatible with adjacent segment disease from the superior ankylosis.  IMPRESSION: 1. Negative CT head. 2. Uncomplicated [id number] ACDF. 3. No acute cervical spine fracture or dislocation. Mild spondylolisthesis in the upper cervical spine appears degenerative.   Electronically Signed   By: Dereck Ligas M.D.   On: 08/25/2014 13:42   Ct Cervical Spine Wo Contrast  08/25/2014   CLINICAL DATA:  Fall today. RIGHT-sided neck and shoulder pain. Headache. No loss of consciousness.  EXAM: CT HEAD WITHOUT CONTRAST  CT CERVICAL SPINE WITHOUT CONTRAST  TECHNIQUE: Multidetector CT imaging of the head and cervical spine was performed following the standard protocol without intravenous contrast. Multiplanar CT image reconstructions of the cervical spine were also generated.  COMPARISON:  MRI 06/22/2010.  CT 06/22/2010.  FINDINGS: CT HEAD FINDINGS  No mass lesion, mass  effect, midline shift, hydrocephalus, hemorrhage. No territorial ischemia or acute infarction. The calvarium is intact. The paranasal sinuses appear normal. Intracranial atherosclerosis is present.  CT CERVICAL SPINE FINDINGS  Alignment: Straightening of the normal cervical lordosis. Between 1 mm and 2 mm anterolisthesis of C3 on C4 and C4 on C5. C5-C6 ACDF.  Craniocervical junction: Atlantodental degenerative disease. Craniocervical alignment appears normal. Odontoid intact. C1 ring intact.  Vertebrae: No aggressive osseous lesions.  Negative for fracture.  Paraspinal soft tissues: Within normal limits. Mild carotid atherosclerosis.  Lung apices: Normal.  Ankylosis of the LEFT C2-C3 facet joints with solid bridging bone on the LEFT and a small amount of bridging bone on the RIGHT.  C5-C6 ACDF. Probable small amount of bridging bone anteriorly. No findings  to suggest pseudoarthrosis. Multilevel cervical degenerative disc and facet disease. Severe LEFT-sided C3-C4 facet disease is present compatible with adjacent segment disease from the superior ankylosis.  IMPRESSION: 1. Negative CT head. 2. Uncomplicated [id number] ACDF. 3. No acute cervical spine fracture or dislocation. Mild spondylolisthesis in the upper cervical spine appears degenerative.   Electronically Signed   By: Dereck Ligas M.D.   On: 08/25/2014 13:42     EKG Interpretation None      MDM   Final diagnoses:  Fall  Lumbar strain, initial encounter  Shoulder pain, acute, right  Head injury, initial encounter     Patient fall on the way to dialysis. Dialysis received about 3 hours. No evidence of needing immediate dialysis probably can wait till next time which will be Friday. Workup for the fall head CT neck CT negative right shoulder negative lumbar spine negative both forearms negative. Soft tissue injuries but no obvious bony injuries. Will treat the right shoulder with a sling him follow-up with orthopedics as needed. No evidence of  any intracranial injury or skull injury.    personally performed the services described in this documentation, which was scribed in my presence. The recorded information has been reviewed and is accurate.     Fredia Sorrow, MD 08/25/14 1517

## 2014-08-25 NOTE — ED Notes (Addendum)
Pt reports tripped and fell down the steps this am. Pt reports bilateral arm pain, back pain,headache. Pt reports was not able to finish dialysis due to "not feeling well." nad noted.

## 2014-08-25 NOTE — ED Notes (Signed)
MD at bedside. 

## 2014-08-27 DIAGNOSIS — N186 End stage renal disease: Secondary | ICD-10-CM | POA: Diagnosis not present

## 2014-08-27 DIAGNOSIS — D509 Iron deficiency anemia, unspecified: Secondary | ICD-10-CM | POA: Diagnosis not present

## 2014-08-27 DIAGNOSIS — N2581 Secondary hyperparathyroidism of renal origin: Secondary | ICD-10-CM | POA: Diagnosis not present

## 2014-08-27 DIAGNOSIS — D631 Anemia in chronic kidney disease: Secondary | ICD-10-CM | POA: Diagnosis not present

## 2014-08-30 DIAGNOSIS — D631 Anemia in chronic kidney disease: Secondary | ICD-10-CM | POA: Diagnosis not present

## 2014-08-30 DIAGNOSIS — N186 End stage renal disease: Secondary | ICD-10-CM | POA: Diagnosis not present

## 2014-08-30 DIAGNOSIS — D509 Iron deficiency anemia, unspecified: Secondary | ICD-10-CM | POA: Diagnosis not present

## 2014-08-30 DIAGNOSIS — N2581 Secondary hyperparathyroidism of renal origin: Secondary | ICD-10-CM | POA: Diagnosis not present

## 2014-08-31 ENCOUNTER — Telehealth: Payer: Self-pay | Admitting: *Deleted

## 2014-08-31 NOTE — Telephone Encounter (Signed)
Called and spoke with Janett Billow and she stated this was fine to fill. Called and spoke with Claiborne Billings at [facility]

## 2014-08-31 NOTE — Telephone Encounter (Signed)
Kirk [name] with [facility] called and stated that Patient's wife  filled Tramadol Rx on 08/29/2014 #100 written by Dr. Nyoka Cowden and then came in today to have a Rx filled for Percocet 5/325 written on 08/25/2014 by ER Dr. Pharmacist concerned it has been 6 days and she is just now filling it. Wants an approval from one of our Dr.'s before she will refill. [name] patient. Please Advise. [phone]

## 2014-09-01 DIAGNOSIS — N186 End stage renal disease: Secondary | ICD-10-CM | POA: Diagnosis not present

## 2014-09-01 DIAGNOSIS — D509 Iron deficiency anemia, unspecified: Secondary | ICD-10-CM | POA: Diagnosis not present

## 2014-09-01 DIAGNOSIS — D631 Anemia in chronic kidney disease: Secondary | ICD-10-CM | POA: Diagnosis not present

## 2014-09-01 DIAGNOSIS — N2581 Secondary hyperparathyroidism of renal origin: Secondary | ICD-10-CM | POA: Diagnosis not present

## 2014-09-03 DIAGNOSIS — D631 Anemia in chronic kidney disease: Secondary | ICD-10-CM | POA: Diagnosis not present

## 2014-09-03 DIAGNOSIS — N2581 Secondary hyperparathyroidism of renal origin: Secondary | ICD-10-CM | POA: Diagnosis not present

## 2014-09-03 DIAGNOSIS — N186 End stage renal disease: Secondary | ICD-10-CM | POA: Diagnosis not present

## 2014-09-03 DIAGNOSIS — D509 Iron deficiency anemia, unspecified: Secondary | ICD-10-CM | POA: Diagnosis not present

## 2014-09-06 DIAGNOSIS — D509 Iron deficiency anemia, unspecified: Secondary | ICD-10-CM | POA: Diagnosis not present

## 2014-09-06 DIAGNOSIS — N186 End stage renal disease: Secondary | ICD-10-CM | POA: Diagnosis not present

## 2014-09-06 DIAGNOSIS — D631 Anemia in chronic kidney disease: Secondary | ICD-10-CM | POA: Diagnosis not present

## 2014-09-06 DIAGNOSIS — N2581 Secondary hyperparathyroidism of renal origin: Secondary | ICD-10-CM | POA: Diagnosis not present

## 2014-09-08 DIAGNOSIS — D509 Iron deficiency anemia, unspecified: Secondary | ICD-10-CM | POA: Diagnosis not present

## 2014-09-08 DIAGNOSIS — N186 End stage renal disease: Secondary | ICD-10-CM | POA: Diagnosis not present

## 2014-09-08 DIAGNOSIS — D631 Anemia in chronic kidney disease: Secondary | ICD-10-CM | POA: Diagnosis not present

## 2014-09-08 DIAGNOSIS — N2581 Secondary hyperparathyroidism of renal origin: Secondary | ICD-10-CM | POA: Diagnosis not present

## 2014-09-09 ENCOUNTER — Emergency Department (HOSPITAL_COMMUNITY): Payer: Medicare Other

## 2014-09-09 ENCOUNTER — Other Ambulatory Visit: Payer: Self-pay

## 2014-09-09 ENCOUNTER — Encounter (HOSPITAL_COMMUNITY): Payer: Self-pay | Admitting: Emergency Medicine

## 2014-09-09 ENCOUNTER — Inpatient Hospital Stay (HOSPITAL_COMMUNITY)
Admission: EM | Admit: 2014-09-09 | Discharge: 2014-09-12 | DRG: 190 | Disposition: A | Discharge status: Home / Self Care | Payer: Medicare Other | Attending: Family Medicine | Admitting: Family Medicine

## 2014-09-09 DIAGNOSIS — Z940 Kidney transplant status: Secondary | ICD-10-CM | POA: Diagnosis not present

## 2014-09-09 DIAGNOSIS — N186 End stage renal disease: Secondary | ICD-10-CM | POA: Diagnosis present

## 2014-09-09 DIAGNOSIS — E785 Hyperlipidemia, unspecified: Secondary | ICD-10-CM | POA: Diagnosis present

## 2014-09-09 DIAGNOSIS — E8770 Fluid overload, unspecified: Secondary | ICD-10-CM | POA: Diagnosis present

## 2014-09-09 DIAGNOSIS — G4733 Obstructive sleep apnea (adult) (pediatric): Secondary | ICD-10-CM | POA: Diagnosis present

## 2014-09-09 DIAGNOSIS — R0603 Acute respiratory distress: Secondary | ICD-10-CM

## 2014-09-09 DIAGNOSIS — E1129 Type 2 diabetes mellitus with other diabetic kidney complication: Secondary | ICD-10-CM | POA: Diagnosis not present

## 2014-09-09 DIAGNOSIS — D696 Thrombocytopenia, unspecified: Secondary | ICD-10-CM | POA: Diagnosis not present

## 2014-09-09 DIAGNOSIS — J441 Chronic obstructive pulmonary disease with (acute) exacerbation: Secondary | ICD-10-CM | POA: Diagnosis not present

## 2014-09-09 DIAGNOSIS — D631 Anemia in chronic kidney disease: Secondary | ICD-10-CM | POA: Diagnosis present

## 2014-09-09 DIAGNOSIS — R0602 Shortness of breath: Secondary | ICD-10-CM

## 2014-09-09 DIAGNOSIS — I10 Essential (primary) hypertension: Secondary | ICD-10-CM

## 2014-09-09 DIAGNOSIS — Z992 Dependence on renal dialysis: Secondary | ICD-10-CM | POA: Diagnosis not present

## 2014-09-09 DIAGNOSIS — E1122 Type 2 diabetes mellitus with diabetic chronic kidney disease: Secondary | ICD-10-CM | POA: Diagnosis present

## 2014-09-09 DIAGNOSIS — G8929 Other chronic pain: Secondary | ICD-10-CM | POA: Diagnosis present

## 2014-09-09 DIAGNOSIS — R0781 Pleurodynia: Secondary | ICD-10-CM

## 2014-09-09 DIAGNOSIS — I120 Hypertensive chronic kidney disease with stage 5 chronic kidney disease or end stage renal disease: Secondary | ICD-10-CM | POA: Diagnosis present

## 2014-09-09 DIAGNOSIS — Z87891 Personal history of nicotine dependence: Secondary | ICD-10-CM | POA: Diagnosis not present

## 2014-09-09 DIAGNOSIS — J9601 Acute respiratory failure with hypoxia: Secondary | ICD-10-CM | POA: Diagnosis not present

## 2014-09-09 DIAGNOSIS — R7303 Prediabetes: Secondary | ICD-10-CM | POA: Diagnosis present

## 2014-09-09 DIAGNOSIS — R7309 Other abnormal glucose: Secondary | ICD-10-CM

## 2014-09-09 DIAGNOSIS — E1121 Type 2 diabetes mellitus with diabetic nephropathy: Secondary | ICD-10-CM | POA: Diagnosis not present

## 2014-09-09 DIAGNOSIS — Z8701 Personal history of pneumonia (recurrent): Secondary | ICD-10-CM | POA: Diagnosis not present

## 2014-09-09 DIAGNOSIS — R05 Cough: Secondary | ICD-10-CM | POA: Diagnosis not present

## 2014-09-09 DIAGNOSIS — R0600 Dyspnea, unspecified: Secondary | ICD-10-CM | POA: Diagnosis not present

## 2014-09-09 DIAGNOSIS — J984 Other disorders of lung: Secondary | ICD-10-CM | POA: Diagnosis not present

## 2014-09-09 DIAGNOSIS — R079 Chest pain, unspecified: Secondary | ICD-10-CM

## 2014-09-09 LAB — D-DIMER, QUANTITATIVE (NOT AT ARMC): D DIMER QUANT: 1.2 ug{FEU}/mL — AB (ref 0.00–0.48)

## 2014-09-09 LAB — BLOOD GAS, ARTERIAL
ACID-BASE EXCESS: 2.8 mmol/L — AB (ref 0.0–2.0)
BICARBONATE: 26.5 meq/L — AB (ref 20.0–24.0)
DRAWN BY: 382351
FIO2: 0.28
O2 Saturation: 93.9 %
PATIENT TEMPERATURE: 37
PCO2 ART: 39.1 mmHg (ref 35.0–45.0)
PH ART: 7.447 (ref 7.350–7.450)
TCO2: 24.1 mmol/L (ref 0–100)
pO2, Arterial: 72.1 mmHg — ABNORMAL LOW (ref 80.0–100.0)

## 2014-09-09 LAB — TROPONIN I: Troponin I: <0.03 ng/mL (ref <0.031)

## 2014-09-09 LAB — CBC
HCT: 32.9 % — ABNORMAL LOW (ref 39.0–52.0)
Hemoglobin: 11.1 g/dL — ABNORMAL LOW (ref 13.0–17.0)
MCH: 33.5 pg (ref 26.0–34.0)
MCHC: 33.7 g/dL (ref 30.0–36.0)
MCV: 99.4 fL (ref 78.0–100.0)
Platelets: 94 10*3/uL — ABNORMAL LOW (ref 150–400)
RBC: 3.31 MIL/uL — ABNORMAL LOW (ref 4.22–5.81)
RDW: 14 % (ref 11.5–15.5)
WBC: 6.1 10*3/uL (ref 4.0–10.5)

## 2014-09-09 LAB — BASIC METABOLIC PANEL
Anion gap: 13 (ref 5–15)
BUN: 30 mg/dL — AB (ref 6–20)
CALCIUM: 8.8 mg/dL — AB (ref 8.9–10.3)
CO2: 31 mmol/L (ref 22–32)
CREATININE: 9.37 mg/dL — AB (ref 0.61–1.24)
Chloride: 94 mmol/L — ABNORMAL LOW (ref 101–111)
GFR calc Af Amer: 7 mL/min — ABNORMAL LOW (ref >60)
GFR, EST NON AFRICAN AMERICAN: 6 mL/min — AB (ref >60)
Glucose, Bld: 154 mg/dL — ABNORMAL HIGH (ref 65–99)
Potassium: 3.8 mmol/L (ref 3.5–5.1)
SODIUM: 138 mmol/L (ref 135–145)

## 2014-09-09 LAB — BRAIN NATRIURETIC PEPTIDE: B NATRIURETIC PEPTIDE 5: 245 pg/mL — AB (ref 0.0–100.0)

## 2014-09-09 MED ORDER — SODIUM CHLORIDE 0.9 % IV SOLN: 250.0000 mL | INTRAVENOUS | Status: DC | PRN

## 2014-09-09 MED ORDER — TRAMADOL HCL 50 MG PO TABS: 50.0000 mg | ORAL_TABLET | Freq: Four times a day (QID) | ORAL | Status: DC | PRN

## 2014-09-09 MED ORDER — ONDANSETRON HCL 4 MG/2ML IJ SOLN
4.0000 mg | Freq: Four times a day (QID) | INTRAMUSCULAR | Status: DC | PRN
  Administered 2014-09-10: 4 mg via INTRAVENOUS
  Filled 2014-09-09: qty 2

## 2014-09-09 MED ORDER — PREDNISONE 50 MG PO TABS
60.0000 mg | ORAL_TABLET | Freq: Once | ORAL | Status: AC
  Administered 2014-09-09: 60 mg via ORAL
  Filled 2014-09-09 (×2): qty 1

## 2014-09-09 MED ORDER — MAGNESIUM SULFATE 2 GM/50ML IV SOLN
1.0000 g | Freq: Once | INTRAVENOUS | Status: AC
  Administered 2014-09-09: 1 g via INTRAVENOUS
  Filled 2014-09-09: qty 50

## 2014-09-09 MED ORDER — ALBUTEROL SULFATE (2.5 MG/3ML) 0.083% IN NEBU
2.5000 mg | INHALATION_SOLUTION | Freq: Once | RESPIRATORY_TRACT | Status: AC
  Administered 2014-09-09: 2.5 mg via RESPIRATORY_TRACT
  Filled 2014-09-09: qty 3

## 2014-09-09 MED ORDER — CARVEDILOL 12.5 MG PO TABS: 25.0000 mg | ORAL_TABLET | Freq: Two times a day (BID) | ORAL | Status: DC

## 2014-09-09 MED ORDER — DEXTROSE 5 % IV SOLN
1.0000 g | Freq: Once | INTRAVENOUS | Status: AC
  Administered 2014-09-09: 1 g via INTRAVENOUS
  Filled 2014-09-09: qty 10

## 2014-09-09 MED ORDER — ONDANSETRON HCL 4 MG PO TABS: 4.0000 mg | ORAL_TABLET | Freq: Four times a day (QID) | ORAL | Status: DC | PRN

## 2014-09-09 MED ORDER — SODIUM CHLORIDE 0.9 % IJ SOLN
3.0000 mL | Freq: Two times a day (BID) | INTRAMUSCULAR | Status: DC
  Administered 2014-09-10 – 2014-09-12 (×3): 3 mL via INTRAVENOUS

## 2014-09-09 MED ORDER — IPRATROPIUM-ALBUTEROL 0.5-2.5 (3) MG/3ML IN SOLN
3.0000 mL | RESPIRATORY_TRACT | Status: DC
Start: 2014-09-09 — End: 2014-09-12
  Administered 2014-09-09 – 2014-09-12 (×14): 3 mL via RESPIRATORY_TRACT
  Filled 2014-09-09 (×15): qty 3

## 2014-09-09 MED ORDER — SODIUM CHLORIDE 0.9 % IJ SOLN: 3.0000 mL | Freq: Two times a day (BID) | INTRAMUSCULAR | Status: DC

## 2014-09-09 MED ORDER — AMLODIPINE BESYLATE 5 MG PO TABS
10.0000 mg | ORAL_TABLET | Freq: Every day | ORAL | Status: DC
  Administered 2014-09-11 – 2014-09-12 (×2): 10 mg via ORAL
  Filled 2014-09-09 (×2): qty 2

## 2014-09-09 MED ORDER — DOXAZOSIN MESYLATE 2 MG PO TABS
2.0000 mg | ORAL_TABLET | Freq: Two times a day (BID) | ORAL | Status: DC
  Administered 2014-09-09 – 2014-09-12 (×5): 2 mg via ORAL
  Filled 2014-09-09 (×5): qty 1

## 2014-09-09 MED ORDER — METHYLPREDNISOLONE SODIUM SUCC 125 MG IJ SOLR
60.0000 mg | Freq: Four times a day (QID) | INTRAMUSCULAR | Status: DC
  Administered 2014-09-09 – 2014-09-11 (×6): 60 mg via INTRAVENOUS
  Filled 2014-09-09 (×6): qty 2

## 2014-09-09 MED ORDER — GUAIFENESIN ER 600 MG PO TB12
1200.0000 mg | ORAL_TABLET | Freq: Two times a day (BID) | ORAL | Status: DC
  Administered 2014-09-09 – 2014-09-12 (×6): 1200 mg via ORAL
  Filled 2014-09-09 (×6): qty 2

## 2014-09-09 MED ORDER — IPRATROPIUM-ALBUTEROL 0.5-2.5 (3) MG/3ML IN SOLN
3.0000 mL | Freq: Once | RESPIRATORY_TRACT | Status: AC
  Administered 2014-09-09: 3 mL via RESPIRATORY_TRACT
  Filled 2014-09-09: qty 3

## 2014-09-09 MED ORDER — SODIUM CHLORIDE 0.9 % IJ SOLN: 3.0000 mL | INTRAMUSCULAR | Status: DC | PRN

## 2014-09-09 MED ORDER — DOXYCYCLINE HYCLATE 100 MG PO TABS
100.0000 mg | ORAL_TABLET | Freq: Two times a day (BID) | ORAL | Status: DC
  Administered 2014-09-09 – 2014-09-12 (×5): 100 mg via ORAL
  Filled 2014-09-09 (×6): qty 1

## 2014-09-09 MED ORDER — RENA-VITE PO TABS
1.0000 | ORAL_TABLET | Freq: Every day | ORAL | Status: DC
  Administered 2014-09-09 – 2014-09-11 (×3): 1 via ORAL
  Filled 2014-09-09 (×3): qty 1

## 2014-09-09 MED ORDER — PRAVASTATIN SODIUM 40 MG PO TABS
40.0000 mg | ORAL_TABLET | Freq: Every evening | ORAL | Status: DC
  Administered 2014-09-09 – 2014-09-11 (×3): 40 mg via ORAL
  Filled 2014-09-09 (×3): qty 1

## 2014-09-09 MED ORDER — ASPIRIN EC 81 MG PO TBEC
81.0000 mg | DELAYED_RELEASE_TABLET | Freq: Every day | ORAL | Status: DC
  Administered 2014-09-11 – 2014-09-12 (×2): 81 mg via ORAL
  Filled 2014-09-09 (×3): qty 1

## 2014-09-09 MED ORDER — HEPARIN SODIUM (PORCINE) 5000 UNIT/ML IJ SOLN
5000.0000 [IU] | Freq: Three times a day (TID) | INTRAMUSCULAR | Status: DC
  Administered 2014-09-09 – 2014-09-11 (×6): 5000 [IU] via SUBCUTANEOUS
  Filled 2014-09-09 (×6): qty 1

## 2014-09-09 MED ORDER — DEXTROSE 5 % IV SOLN
500.0000 mg | INTRAVENOUS | Status: DC
  Administered 2014-09-09: 500 mg via INTRAVENOUS
  Filled 2014-09-09: qty 500

## 2014-09-09 MED ORDER — CARVEDILOL 12.5 MG PO TABS
25.0000 mg | ORAL_TABLET | Freq: Two times a day (BID) | ORAL | Status: DC
  Administered 2014-09-09 – 2014-09-12 (×5): 25 mg via ORAL
  Filled 2014-09-09 (×6): qty 2

## 2014-09-09 MED ORDER — ASPIRIN EC 81 MG PO TBEC: 81.0000 mg | DELAYED_RELEASE_TABLET | Freq: Every day | ORAL | Status: DC

## 2014-09-09 MED ORDER — VITAMIN D 1000 UNITS PO TABS
5000.0000 [IU] | ORAL_TABLET | Freq: Every morning | ORAL | Status: DC
  Administered 2014-09-11 – 2014-09-12 (×2): 5000 [IU] via ORAL
  Filled 2014-09-09 (×3): qty 5

## 2014-09-09 MED ORDER — OXYCODONE-ACETAMINOPHEN 5-325 MG PO TABS
1.0000 | ORAL_TABLET | Freq: Three times a day (TID) | ORAL | Status: DC | PRN
  Administered 2014-09-10: 2 via ORAL
  Administered 2014-09-10: 1 via ORAL
  Filled 2014-09-09: qty 2
  Filled 2014-09-09: qty 1

## 2014-09-09 NOTE — ED Notes (Signed)
Ambulated pt in hallway with pulse oximetry. Patient's oxygen saturation between 89-93% when ambulation.

## 2014-09-09 NOTE — ED Notes (Signed)
Placed pt on 2L nasal cannula. Pt's sats dropped to 86%. Sats increased to 91% on 2L. Will continue to monitor.

## 2014-09-09 NOTE — ED Notes (Signed)
MD Mesner at bedside at bedside with patient and family.

## 2014-09-09 NOTE — ED Provider Notes (Signed)
CSN: [id number]     Arrival date & time 09/09/14  1546 History   First MD Initiated Contact with Patient 09/09/14 1604     Chief Complaint  Patient presents with  . Shortness of Breath     (Consider location/radiation/quality/duration/timing/severity/associated sxs/prior Treatment) The history is provided by the spouse and the patient.     54 year old male history multiple medical problems as documented below to include dialysis hypertension COPD presents to the emergency department today with Q onset of dyspnea worse with lying down that started last night. Similar previous episodes of pneumonia. Last dialysis was yesterday is not get hold again until tomorrow. Has some associated chest discomfort with the coughing. Some subjective fevers but nothing measured. No other  associated symptoms.  Past Medical History  Diagnosis Date  . Anxiety   . Chronic headaches   . Chronic kidney disease (CKD)   . Dialysis care 02/06/2010  . HTN (hypertension)   . Sleep apnea     wears the CPAP  . Type II or unspecified type diabetes mellitus with renal manifestations, uncontrolled   . Shortness of breath   . Impotence of organic origin   . Other malaise and fatigue   . Debility, unspecified   . Cellulitis and abscess of trunk   . Kidney replaced by transplant   . Acute edema of lung, unspecified   . Unspecified constipation   . Other chronic nonalcoholic liver disease   . Localization-related (focal) (partial) epilepsy and epileptic syndromes with complex partial seizures, without mention of intractable epilepsy   . Tension headache   . Dermatophytosis of the body   . Gout, unspecified   . Pain in joint, upper arm   . Pain in joint, lower leg   . Other nonspecific abnormal serum enzyme levels   . Essential hypertension, benign   . Acute, but ill-defined, cerebrovascular disease   . Memory loss   . Hypotension, unspecified   . Hypertrophy of prostate without urinary obstruction and other  lower urinary tract symptoms (LUTS)   . Renal dialysis status(V45.11) 02/05/2010    restarted 01/02/13 ofter renal trransplant failure  . End stage renal disease   . Insomnia, unspecified   . Secondary hyperparathyroidism (of renal origin)   . Lumbago   . Unspecified vitamin D deficiency   . Carpal tunnel syndrome   . 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)   . Unspecified hereditary and idiopathic peripheral neuropathy   . Other and unspecified hyperlipidemia   . Unspecified essential hypertension   . Arteriovenous fistula, acquired   . Edema   . Vasectomy status   . Obstructive sleep apnea (adult) (pediatric)     CPAP  . Cholelithiasis 07/13/2014   Past Surgical History  Procedure Laterality Date  . Inner ear surgery  1973    for deafness at age 89  . Colonoscopy    . Dialysis shunt placement    . Kidney transplant  08/17/2011    [facility]   . Placement of fistula at [facility] vein specialist    . Left heart catheterization with coronary angiogram N/A 03/21/2011    Procedure: LEFT HEART CATHETERIZATION WITH CORONARY ANGIOGRAM;  Surgeon: [name], MD;  Location: [facility] CATH LAB;  Service: Cardiovascular;  Laterality: N/A;  . Cervical disc surgery      for spinal stenosis  . Nephrectomy      removed transplaned kidney   Family History  Problem Relation Age of Onset  . Adopted: Yes  . Colon  cancer Neg Hx   . Esophageal cancer Neg Hx   . Rectal cancer Neg Hx   . Stomach cancer Neg Hx    Social History  Substance Use Topics  . Smoking status: Former Smoker -- 28 years    Types: Cigarettes    Quit date: 08/04/2011  . Smokeless tobacco: Never Used     Comment: 1/2 pack every 4 days  . Alcohol Use: No    Review of Systems  Constitutional: Positive for fever. Negative for chills.  Respiratory: Positive for cough, shortness of breath and wheezing.   Cardiovascular: Positive for chest pain. Negative for leg swelling.  Gastrointestinal: Negative for nausea,  vomiting and abdominal pain.  Musculoskeletal:       Arm pain from fall previously  All other systems reviewed and are negative.     Allergies  Codeine  Home Medications   Prior to Admission medications   Medication Sig Start Date End Date Taking? Authorizing Provider  acetaminophen (TYLENOL) 500 MG tablet Take 1,000 mg by mouth every 6 (six) hours as needed for moderate pain.   Yes Historical Provider, MD  albuterol (PROVENTIL) (2.5 MG/3ML) 0.083% nebulizer solution Take 3 mLs (2.5 mg total) by nebulization every 6 (six) hours as needed for wheezing. 04/16/14  Yes Kathie Dike, MD  albuterol-ipratropium (COMBIVENT) 18-103 MCG/ACT inhaler Inhale 2 puffs into the lungs every 6 (six) hours as needed for wheezing. 07/08/12  Yes Lauree Chandler, NP  amLODipine (NORVASC) 10 MG tablet Take 1 tablet (10 mg total) by mouth daily. 03/29/14  Yes Estill Dooms, MD  aspirin EC 81 MG tablet Take 81 mg by mouth daily.   Yes Historical Provider, MD  b complex-vitamin c-folic acid (NEPHRO-VITE) 0.8 MG TABS tablet TAKE 1 TABLET BY MOUTH EVERY DAY 05/25/14  Yes Estill Dooms, MD  calcium acetate, Phos Binder, (PHOSLYRA) 667 MG/5ML SOLN Take 2,001 mg by mouth 3 (three) times daily with meals. And 667 mg with snack   Yes Historical Provider, MD  carvedilol (COREG) 25 MG tablet Take 1 tablet (25 mg total) by mouth 2 (two) times daily. Take one tablet in the morning and one tablet in the evening 03/29/14  Yes Estill Dooms, MD  Cholecalciferol 5000 UNITS TABS Take 5,000 Units by mouth every morning.   Yes Historical Provider, MD  diphenhydrAMINE (BENADRYL) 25 MG tablet Take 50 mg by mouth 2 (two) times daily.    Yes Historical Provider, MD  doxazosin (CARDURA) 4 MG tablet Take 0.5 tablet  by mouth in the am and 0.5 tablet by mouth in the pm to regulate blood pressure. Patient taking differently: Take 2 mg by mouth 2 (two) times daily. Take 0.5 tablet  by mouth in the am and 0.5 tablet by mouth in the pm to  regulate blood pressure. 04/01/14  Yes Tiffany L Reed, DO  guaiFENesin-dextromethorphan (ROBITUSSIN DM) 100-10 MG/5ML syrup Take 10 mLs by mouth every 4 (four) hours as needed for cough.   Yes Historical Provider, MD  pravastatin (PRAVACHOL) 40 MG tablet Take 1 tablet (40 mg total) by mouth every evening. 03/29/14  Yes Estill Dooms, MD  promethazine (PHENERGAN) 25 MG tablet Take 1 tablet (25 mg total) by mouth every 8 (eight) hours as needed for nausea or vomiting. 07/13/14  Yes Estill Dooms, MD  oxyCODONE-acetaminophen (PERCOCET/ROXICET) 5-325 MG per tablet Take 1-2 tablets by mouth every 6 (six) hours as needed for moderate pain. Patient not taking: Reported on 08/25/2014 06/25/14  Kathie Dike, MD  oxyCODONE-acetaminophen (PERCOCET/ROXICET) 5-325 MG per tablet Take 2 tablets by mouth every 8 (eight) hours as needed for severe pain. Patient not taking: Reported on 09/09/2014 07/02/14   Fredia Sorrow, MD  oxyCODONE-acetaminophen (PERCOCET/ROXICET) 5-325 MG per tablet Take 1-2 tablets by mouth every 8 (eight) hours as needed for severe pain. 08/25/14   Fredia Sorrow, MD  traMADol (ULTRAM) 50 MG tablet One up to 4 times daly if needed for pain Patient taking differently: Take 50 mg by mouth every 6 (six) hours as needed. One up to 4 times daly if needed for pain 06/16/14   Estill Dooms, MD   BP 160/80 mmHg  Pulse 84  Temp(Src) 99.3 F (37.4 C) (Oral)  Resp 25  Ht 5\' 7"  (1.702 m)  Wt 236 lb (107.049 kg)  BMI 36.95 kg/m2  SpO2 94% Physical Exam  Constitutional: He is oriented to person, place, and time. He appears well-developed and well-nourished.  HENT:  Head: Normocephalic and atraumatic.  Eyes: Conjunctivae and EOM are normal.  Neck: Normal range of motion. Neck supple.  Cardiovascular: Normal rate and regular rhythm.   Pulmonary/Chest: Effort normal. No accessory muscle usage. Tachypnea noted. No respiratory distress. He has decreased breath sounds. He has wheezes.  Abdominal: Soft.  There is no tenderness.  Musculoskeletal: Normal range of motion. He exhibits no edema or tenderness.  Neurological: He is alert and oriented to person, place, and time.  Skin: Skin is warm and dry.  Ecchymosis left forearm, appears old  Nursing note and vitals reviewed.   ED Course  Procedures (including critical care time)  CRITICAL CARE Performed by: Merrily Pew   Total critical care time: 35 minutes  Critical care time was exclusive of separately billable procedures and treating other patients.  Critical care was necessary to treat or prevent imminent or life-threatening deterioration.  Critical care was time spent personally by me on the following activities: development of treatment plan with patient and/or surrogate as well as nursing, discussions with consultants, evaluation of patient's response to treatment, examination of patient, obtaining history from patient or surrogate, ordering and performing treatments and interventions, ordering and review of laboratory studies, ordering and review of radiographic studies, pulse oximetry and re-evaluation of patient's condition.   Labs Review Labs Reviewed  BASIC METABOLIC PANEL - Abnormal; Notable for the following:    Chloride 94 (*)    Glucose, Bld 154 (*)    BUN 30 (*)    Creatinine, Ser 9.37 (*)    Calcium 8.8 (*)    GFR calc non Af Amer 6 (*)    GFR calc Af Amer 7 (*)    All other components within normal limits  CBC - Abnormal; Notable for the following:    RBC 3.31 (*)    Hemoglobin 11.1 (*)    HCT 32.9 (*)    Platelets 94 (*)    All other components within normal limits  BRAIN NATRIURETIC PEPTIDE - Abnormal; Notable for the following:    B Natriuretic Peptide 245.0 (*)    All other components within normal limits  TROPONIN I  TROPONIN I  COMPREHENSIVE METABOLIC PANEL  D-DIMER, QUANTITATIVE (NOT AT [facility])  BLOOD GAS, ARTERIAL    Imaging Review Dg Chest 2 View  09/09/2014   CLINICAL DATA:  Shortness of  breath today. Productive cough. Dialysis yesterday.  EXAM: CHEST  2 VIEW  COMPARISON:  08/25/2014.  FINDINGS: Normal sized heart. Clear lungs. Central peribronchial thickening. Cervical spine fixation hardware.  IMPRESSION: No acute abnormality. Stable mild to moderate chronic bronchitic changes.   Electronically Signed   By: Claudie Revering M.D.   On: 09/09/2014 16:52   I have personally reviewed and evaluated these images and lab results as part of my medical decision-making.   EKG Interpretation   Date/Time:  Thursday September 09 2014 16:02:57 EDT Ventricular Rate:  85 PR Interval:  162 QRS Duration: 86 QT Interval:  403 QTC Calculation: 479 R Axis:   71 Text Interpretation:  Sinus rhythm Borderline prolonged QT interval  Baseline wander in lead(s) II III aVF V4 V5 V6 similar to prior.   Confirmed by [facility] MD, Corene Cornea [phone]) on 09/09/2014 4:07:19 PM      MDM   Final diagnoses:  COPD exacerbation  Respiratory distress   54 year old male history of COPD, end-stage renal disease on dialysis presents to the emergency department today for about 24 hours worth of shortness of breath, productive cough and subjective fevers without chills. Exam with significant wheezing decreased breath sounds respiratory distress that is mild. Initial concern was for likely COPD exacerbation. However in the patient with end-stage renal disease is also a concern for fluid overload versus coronary causes. Patient was EKG and chest x-ray and physical examination felt that COPD exacerbation was more likely. Patient given an hour continued continuous DuoNeb's with magnesium, prednisone and antibiotics. His symptoms improved markedly to where he was not as tachypnea And his O2 sat was initially stated above 90% on room air while at rest. However multiple times to get him around the department he desatted to 83% with a good waveform. Patient likely needs some more albuterol and further observation in the hospital to ensure  that she he improves as he had multiple episodes of severe pneumonia in the past. Discussed case with hospitalist and will admit the patient for further albuterol, antibiotics and monitoring.   Merrily Pew, MD 09/09/14 [phone]

## 2014-09-09 NOTE — ED Notes (Signed)
RT paged for neb treatment.

## 2014-09-09 NOTE — ED Notes (Signed)
Pt ambulated to restroom & returned to treatment room . Placed back on monitor, O2 sats had dropped to 83% after ambulating back & forth. Pt states he felt SOB again. Pt palced back on 2l O2 by nasal canula.  EDP notified.

## 2014-09-09 NOTE — H&P (Signed)
[facility] History and Physical  DEMEKO TRABUE [id number] DOB: 1960-06-26 DOA: 09/09/2014  Referring physician: Dr. Dayna Barker - [facility] PCP: Estill Dooms, MD   Chief Complaint: SOB  HPI: Kirk [name] is a 54 y.o. male   Shortness of breath. Started overnight patient was lying down. Patient states that symptoms are similar to his previous episode of pneumonia. Patient is on dialysis Monday Wednesday Friday. Subjective fevers. Productive cough - white sputum. Tried multiple over-the-counter medications and prescription inhalers without improvement. Symptoms are getting worse and are constant. Typically patient able to sleep lying down. Patient states that his last dialysis appointment they took 3 L of fluid off. Patient also complaining of a left leg ache though no left leg swelling. Patient were CPAP at night.  Mild substernal chest pain with radiation to the right during this period of time. No palpitations, diaphoresis, nausea, vomiting or radiation to the left shoulder and neck.  Review of Systems:  Constitutional:  No weight loss, night sweats,  HEENT:  No headaches, Difficulty swallowing,Tooth/dental problems,Sore throat,  No sneezing, itching, ear ache, nasal congestion, post nasal drip,  Cardio-vascular: per HPI GI:  No heartburn, indigestion, abdominal pain, nausea, vomiting, diarrhea, change in bowel habits, loss of appetite  Resp: Per HPi Skin:  no rash or lesions.  GU:  no dysuria, change in color of urine, no urgency or frequency. No flank pain.  Musculoskeletal:   No joint pain or swelling. No decreased range of motion. No back pain.  Psych:  No change in mood or affect. No depression or anxiety. No memory loss.   Past Medical History  Diagnosis Date  . Anxiety   . Chronic headaches   . Chronic kidney disease (CKD)   . Dialysis care 02/06/2010  . HTN (hypertension)   . Sleep apnea     wears the CPAP  . Type II or unspecified type diabetes mellitus  with renal manifestations, uncontrolled   . Shortness of breath   . Impotence of organic origin   . Other malaise and fatigue   . Debility, unspecified   . Cellulitis and abscess of trunk   . Kidney replaced by transplant   . Acute edema of lung, unspecified   . Unspecified constipation   . Other chronic nonalcoholic liver disease   . Localization-related (focal) (partial) epilepsy and epileptic syndromes with complex partial seizures, without mention of intractable epilepsy   . Tension headache   . Dermatophytosis of the body   . Gout, unspecified   . Pain in joint, upper arm   . Pain in joint, lower leg   . Other nonspecific abnormal serum enzyme levels   . Essential hypertension, benign   . Acute, but ill-defined, cerebrovascular disease   . Memory loss   . Hypotension, unspecified   . Hypertrophy of prostate without urinary obstruction and other lower urinary tract symptoms (LUTS)   . Renal dialysis status(V45.11) 02/05/2010    restarted 01/02/13 ofter renal trransplant failure  . End stage renal disease   . Insomnia, unspecified   . Secondary hyperparathyroidism (of renal origin)   . Lumbago   . Unspecified vitamin D deficiency   . Carpal tunnel syndrome   . 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)   . Unspecified hereditary and idiopathic peripheral neuropathy   . Other and unspecified hyperlipidemia   . Unspecified essential hypertension   . Arteriovenous fistula, acquired   . Edema   . Vasectomy status   . Obstructive sleep apnea (adult) (pediatric)  CPAP  . Cholelithiasis 07/13/2014   Past Surgical History  Procedure Laterality Date  . Inner ear surgery  1973    for deafness at age 28  . Colonoscopy    . Dialysis shunt placement    . Kidney transplant  08/17/2011    [facility]   . Placement of fistula at [facility] vein specialist    . Left heart catheterization with coronary angiogram N/A 03/21/2011    Procedure: LEFT HEART CATHETERIZATION WITH CORONARY  ANGIOGRAM;  Surgeon: [name], MD;  Location: [facility] CATH LAB;  Service: Cardiovascular;  Laterality: N/A;  . Cervical disc surgery      for spinal stenosis  . Nephrectomy      removed transplaned kidney   Social History:  reports that he quit smoking about 3 years ago. His smoking use included Cigarettes. He quit after 28 years of use. He has never used smokeless tobacco. He reports that he does not drink alcohol or use illicit drugs.  Allergies  Allergen Reactions  . Codeine Nausea And Vomiting and Nausea Only    Family History  Problem Relation Age of Onset  . Adopted: Yes  . Colon cancer Neg Hx   . Esophageal cancer Neg Hx   . Rectal cancer Neg Hx   . Stomach cancer Neg Hx      Prior to Admission medications   Medication Sig Start Date End Date Taking? Authorizing Provider  acetaminophen (TYLENOL) 500 MG tablet Take 1,000 mg by mouth every 6 (six) hours as needed for moderate pain.   Yes Historical Provider, MD  albuterol (PROVENTIL) (2.5 MG/3ML) 0.083% nebulizer solution Take 3 mLs (2.5 mg total) by nebulization every 6 (six) hours as needed for wheezing. 04/16/14  Yes Kathie Dike, MD  albuterol-ipratropium (COMBIVENT) 18-103 MCG/ACT inhaler Inhale 2 puffs into the lungs every 6 (six) hours as needed for wheezing. 07/08/12  Yes Lauree Chandler, NP  amLODipine (NORVASC) 10 MG tablet Take 1 tablet (10 mg total) by mouth daily. 03/29/14  Yes Estill Dooms, MD  aspirin EC 81 MG tablet Take 81 mg by mouth daily.   Yes Historical Provider, MD  b complex-vitamin c-folic acid (NEPHRO-VITE) 0.8 MG TABS tablet TAKE 1 TABLET BY MOUTH EVERY DAY 05/25/14  Yes Estill Dooms, MD  calcium acetate, Phos Binder, (PHOSLYRA) 667 MG/5ML SOLN Take 2,001 mg by mouth 3 (three) times daily with meals. And 667 mg with snack   Yes Historical Provider, MD  carvedilol (COREG) 25 MG tablet Take 1 tablet (25 mg total) by mouth 2 (two) times daily. Take one tablet in the morning and one tablet in the  evening 03/29/14  Yes Estill Dooms, MD  Cholecalciferol 5000 UNITS TABS Take 5,000 Units by mouth every morning.   Yes Historical Provider, MD  diphenhydrAMINE (BENADRYL) 25 MG tablet Take 50 mg by mouth 2 (two) times daily.    Yes Historical Provider, MD  doxazosin (CARDURA) 4 MG tablet Take 0.5 tablet  by mouth in the am and 0.5 tablet by mouth in the pm to regulate blood pressure. Patient taking differently: Take 2 mg by mouth 2 (two) times daily. Take 0.5 tablet  by mouth in the am and 0.5 tablet by mouth in the pm to regulate blood pressure. 04/01/14  Yes Tiffany L Reed, DO  guaiFENesin-dextromethorphan (ROBITUSSIN DM) 100-10 MG/5ML syrup Take 10 mLs by mouth every 4 (four) hours as needed for cough.   Yes Historical Provider, MD  pravastatin (PRAVACHOL) 40 MG  tablet Take 1 tablet (40 mg total) by mouth every evening. 03/29/14  Yes Estill Dooms, MD  promethazine (PHENERGAN) 25 MG tablet Take 1 tablet (25 mg total) by mouth every 8 (eight) hours as needed for nausea or vomiting. 07/13/14  Yes Estill Dooms, MD  oxyCODONE-acetaminophen (PERCOCET/ROXICET) 5-325 MG per tablet Take 1-2 tablets by mouth every 6 (six) hours as needed for moderate pain. Patient not taking: Reported on 08/25/2014 06/25/14   Kathie Dike, MD  oxyCODONE-acetaminophen (PERCOCET/ROXICET) 5-325 MG per tablet Take 2 tablets by mouth every 8 (eight) hours as needed for severe pain. Patient not taking: Reported on 09/09/2014 07/02/14   Fredia Sorrow, MD  oxyCODONE-acetaminophen (PERCOCET/ROXICET) 5-325 MG per tablet Take 1-2 tablets by mouth every 8 (eight) hours as needed for severe pain. 08/25/14   Fredia Sorrow, MD  traMADol (ULTRAM) 50 MG tablet One up to 4 times daly if needed for pain Patient taking differently: Take 50 mg by mouth every 6 (six) hours as needed. One up to 4 times daly if needed for pain 06/16/14   Estill Dooms, MD   Physical Exam: Filed Vitals:   09/09/14 1900 09/09/14 1930 09/09/14 2026 09/09/14 2036    BP: 146/87 146/82 160/80   Pulse: 78 81 84   Temp:   99.3 F (37.4 C)   TempSrc:   Oral   Resp:   25   Height:      Weight:      SpO2: 96% 95% 91% 94%    Wt Readings from Last 3 Encounters:  09/09/14 107.049 kg (236 lb)  08/25/14 105.688 kg (233 lb)  07/13/14 106.414 kg (234 lb 9.6 oz)    General: mild distress Eyes:  PERRL, normal lids, irises & conjunctiva ENT: dry mucous membranes Neck:  no LAD, masses or thyromegaly Cardiovascular:  Difficult to appreciate cardiac heart sounds due to severe COPD exacerbation. RRR, no m/r/g. No LE edema. Respiratory: course breath sounds with wheezing and rhonchi throughout, decreased air movement, all respiratory distress. Abdomen:  soft, ntnd Skin:  no rash or induration seen on limited exam Musculoskeletal:  grossly normal tone BUE/BLE Psychiatric:  grossly normal mood and affect, speech fluent and appropriate Neurologic:  grossly non-focal.          Labs on Admission:  Basic Metabolic Panel:  Recent Labs Lab 09/09/14 1614  NA 138  K 3.8  CL 94*  CO2 31  GLUCOSE 154*  BUN 30*  CREATININE 9.37*  CALCIUM 8.8*   Liver Function Tests: No results for input(s): AST, ALT, ALKPHOS, BILITOT, PROT, ALBUMIN in the last 168 hours. No results for input(s): LIPASE, AMYLASE in the last 168 hours. No results for input(s): AMMONIA in the last 168 hours. CBC:  Recent Labs Lab 09/09/14 1614  WBC 6.1  HGB 11.1*  HCT 32.9*  MCV 99.4  PLT 94*   Cardiac Enzymes:  Recent Labs Lab 09/09/14 1614 09/09/14 1948  TROPONINI <0.03 <0.03    BNP (last 3 results)  Recent Labs  04/06/14 0340 09/09/14 1614  BNP 264.0* 245.0*    ProBNP (last 3 results) No results for input(s): PROBNP in the last 8760 hours.  CBG: No results for input(s): GLUCAP in the last 168 hours.  Radiological Exams on Admission: Dg Chest 2 View  09/09/2014   CLINICAL DATA:  Shortness of breath today. Productive cough. Dialysis yesterday.  EXAM: CHEST  2  VIEW  COMPARISON:  08/25/2014.  FINDINGS: Normal sized heart. Clear lungs. Central peribronchial thickening. Cervical  spine fixation hardware.  IMPRESSION: No acute abnormality. Stable mild to moderate chronic bronchitic changes.   Electronically Signed   By: Claudie Revering M.D.   On: 09/09/2014 16:52      Assessment/Plan Active Problems:   Acute respiratory failure with hypoxia   Pre-diabetes   COPD exacerbation   ESRD on dialysis   Essential hypertension   HLD (hyperlipidemia)   Pleuritic chest pain   Acute respiratory distress: Likely secondary to COPD exacerbation. No overt sign of pneumonia. Still some possibility of DVT/PE. BNP 245. Patient desats to 83% with ambulation. - Telemetry - Duo nebs every 4 hours - Solu-Medrol 60 mg every 6 hours - CPAP daily at bedtime - ABG - D-dimer - Mucinex - Start Doxy and stop Azithro and CTX  CP: likely pleuritic secondary to acute respiratory distress given negative troponin 2 and EKG without sign of ACS. Typically with coughing and deep respirations  - Telemetry -  EKG in a.m.  ESRD/Dialysis: creatinine 9.37. Anuretic. dialysis Monday Wednesday Friday. Last dialysis Wednesday with 3 L fluid removal. - Consult nephrology for likely dialysis on 09/10/2014 - continue renal vitamins  Chronic pain:  - continue percocet and tramadol  HTN: - continue coreg, norvasc, cardura  HLD - continue statin  Pre DM: last A1c 6.0 on 03/2014. Glucose nml on admission - A1c       Code Status: FUll DVT Prophylaxis: Hep Family Communication: wife and daughter Disposition Plan: pendingimprovment  MERRELL, DAVID Lenna Sciara, MD Family Medicine [facility] [email or web address] Password [email or web address]

## 2014-09-09 NOTE — ED Notes (Signed)
Incorrect entry on VS

## 2014-09-09 NOTE — ED Notes (Signed)
MD Mesner at bedside  °

## 2014-09-09 NOTE — ED Notes (Signed)
Family at bedside. Patient eating a honey bun at this time. States that he is not having any pain at this time.

## 2014-09-09 NOTE — ED Notes (Signed)
Pt states that he has been sob today and nebulizer is not helping.  Has productive cough.  Dialysis yesterday.

## 2014-09-10 ENCOUNTER — Inpatient Hospital Stay (HOSPITAL_COMMUNITY): Payer: Medicare Other

## 2014-09-10 DIAGNOSIS — Z992 Dependence on renal dialysis: Secondary | ICD-10-CM

## 2014-09-10 DIAGNOSIS — N186 End stage renal disease: Secondary | ICD-10-CM

## 2014-09-10 DIAGNOSIS — J9601 Acute respiratory failure with hypoxia: Secondary | ICD-10-CM

## 2014-09-10 DIAGNOSIS — J441 Chronic obstructive pulmonary disease with (acute) exacerbation: Principal | ICD-10-CM

## 2014-09-10 DIAGNOSIS — E1121 Type 2 diabetes mellitus with diabetic nephropathy: Secondary | ICD-10-CM

## 2014-09-10 LAB — COMPREHENSIVE METABOLIC PANEL
ALT: 20 U/L (ref 17–63)
ANION GAP: 13 (ref 5–15)
AST: 21 U/L (ref 15–41)
Albumin: 3.7 g/dL (ref 3.5–5.0)
Alkaline Phosphatase: 64 U/L (ref 38–126)
BUN: 47 mg/dL — ABNORMAL HIGH (ref 6–20)
CHLORIDE: 97 mmol/L — AB (ref 101–111)
CO2: 26 mmol/L (ref 22–32)
Calcium: 8.8 mg/dL — ABNORMAL LOW (ref 8.9–10.3)
Creatinine, Ser: 10.76 mg/dL — ABNORMAL HIGH (ref 0.61–1.24)
GFR, EST AFRICAN AMERICAN: 6 mL/min — AB (ref >60)
GFR, EST NON AFRICAN AMERICAN: 5 mL/min — AB (ref >60)
Glucose, Bld: 165 mg/dL — ABNORMAL HIGH (ref 65–99)
Potassium: 4.6 mmol/L (ref 3.5–5.1)
SODIUM: 136 mmol/L (ref 135–145)
Total Bilirubin: 0.9 mg/dL (ref 0.3–1.2)
Total Protein: 7 g/dL (ref 6.5–8.1)

## 2014-09-10 LAB — MRSA PCR SCREENING: MRSA by PCR: NEGATIVE

## 2014-09-10 MED ORDER — CALCIUM ACETATE (PHOS BINDER) 667 MG/5ML PO SOLN: 2001.0000 mg | Freq: Three times a day (TID) | ORAL | Status: DC

## 2014-09-10 MED ORDER — LIDOCAINE HCL (PF) 1 % IJ SOLN: 5.0000 mL | INTRAMUSCULAR | Status: DC | PRN

## 2014-09-10 MED ORDER — PROMETHAZINE HCL 12.5 MG PO TABS
25.0000 mg | ORAL_TABLET | Freq: Three times a day (TID) | ORAL | Status: DC | PRN
  Administered 2014-09-10: 25 mg via ORAL
  Filled 2014-09-10: qty 2

## 2014-09-10 MED ORDER — CHLORHEXIDINE GLUCONATE 0.12 % MT SOLN: 15.0000 mL | Freq: Two times a day (BID) | OROMUCOSAL | Status: DC

## 2014-09-10 MED ORDER — LIDOCAINE-PRILOCAINE 2.5-2.5 % EX CREA: 1.0000 "application " | TOPICAL_CREAM | CUTANEOUS | Status: DC | PRN

## 2014-09-10 MED ORDER — HEPARIN SODIUM (PORCINE) 1000 UNIT/ML DIALYSIS
1000.0000 [IU] | INTRAMUSCULAR | Status: DC | PRN
  Filled 2014-09-10: qty 1

## 2014-09-10 MED ORDER — CALCIUM ACETATE (PHOS BINDER) 667 MG PO CAPS: 667.0000 mg | ORAL_CAPSULE | ORAL | Status: DC | PRN

## 2014-09-10 MED ORDER — PENTAFLUOROPROP-TETRAFLUOROETH EX AERO: 1.0000 "application " | INHALATION_SPRAY | CUTANEOUS | Status: DC | PRN

## 2014-09-10 MED ORDER — NEPRO/CARBSTEADY PO LIQD: 237.0000 mL | ORAL | Status: DC | PRN

## 2014-09-10 MED ORDER — TRAMADOL HCL 50 MG PO TABS: 50.0000 mg | ORAL_TABLET | Freq: Two times a day (BID) | ORAL | Status: DC | PRN

## 2014-09-10 MED ORDER — CETYLPYRIDINIUM CHLORIDE 0.05 % MT LIQD
7.0000 mL | Freq: Two times a day (BID) | OROMUCOSAL | Status: DC
  Administered 2014-09-11 (×2): 7 mL via OROMUCOSAL

## 2014-09-10 MED ORDER — CALCIUM ACETATE (PHOS BINDER) 667 MG PO CAPS
2001.0000 mg | ORAL_CAPSULE | Freq: Three times a day (TID) | ORAL | Status: DC
  Administered 2014-09-11 – 2014-09-12 (×4): 2001 mg via ORAL
  Filled 2014-09-10 (×4): qty 3

## 2014-09-10 MED ORDER — ALTEPLASE 2 MG IJ SOLR
2.0000 mg | Freq: Once | INTRAMUSCULAR | Status: DC | PRN
  Filled 2014-09-10: qty 2

## 2014-09-10 MED ORDER — SODIUM CHLORIDE 0.9 % IV SOLN: 100.0000 mL | INTRAVENOUS | Status: DC | PRN

## 2014-09-10 MED ORDER — HEPARIN SODIUM (PORCINE) 1000 UNIT/ML DIALYSIS
20.0000 [IU]/kg | INTRAMUSCULAR | Status: DC | PRN
  Administered 2014-09-10: 2100 [IU] via INTRAVENOUS_CENTRAL
  Filled 2014-09-10 (×2): qty 3

## 2014-09-10 MED ORDER — HEPARIN SODIUM (PORCINE) 1000 UNIT/ML IJ SOLN
INTRAMUSCULAR | Status: AC
  Administered 2014-09-10: 2100 [IU] via INTRAVENOUS_CENTRAL
  Filled 2014-09-10: qty 3

## 2014-09-10 NOTE — Procedures (Signed)
   HEMODIALYSIS TREATMENT NOTE:  4 hour low-heparin dialysis completed via left forearm AVF (15g ante/retrograde - buttonhole). Goal met:  3.2 liters removed; no interruption in ultrafiltration. All blood was reinfused. Hemostasis achieved within 10 minutes. Report given to [place], Therapist, sports.  Rockwell [place], RN, CDN

## 2014-09-10 NOTE — Care Management Note (Signed)
Case Management Note  Patient Details  Name: GLENWOOD PENDELTON MRN: [id number] Date of Birth: 1960/06/04  Subjective/Objective:                  Pt admitted from home with COPD exacerbation. Pt lives with his wife and will return home at discharge. Pt is independent with ADL's. Pt has a neb machine and cpap for home use.   Action/Plan: Will need home O2 assessment prior to discharge. Will continue to follow for discharge planning needs.  Expected Discharge Date:  09/13/14               Expected Discharge Plan:  Home/Self Care  In-House Referral:  NA  Discharge planning Services  CM Consult  Post Acute Care Choice:  NA Choice offered to:  NA  DME Arranged:    DME Agency:     HH Arranged:    HH Agency:     Status of Service:  In process, will continue to follow  Medicare Important Message Given:  Yes-second notification given Date Medicare IM Given:    Medicare IM give by:    Date Additional Medicare IM Given:    Additional Medicare Important Message give by:     If discussed at [place] of Stay Meetings, dates discussed:    Additional Comments:  Joylene Draft, RN 09/10/2014, 12:58 PM

## 2014-09-10 NOTE — Consult Note (Signed)
Reason for Consult: End-stage renal disease Referring Physician: Dr. Kathrine Cords is an 54 y.o. male.  HPI: Patient with history of hypertension, diabetes, obstructive sleep apnea, end-stage renal disease on maintenance hemodialysis presently came with complaints of difficulty breathing, cough with whitish sputum and some chills for the last 2 days. Patient also complains of some back and abdominal pain which seems to be intermittent. He was told he has gallbladder stone and he is going to have surgery next months. Patient denies any nausea or vomiting. His last dialysis was on Wednesday.  Past Medical History  Diagnosis Date  . Anxiety   . Chronic headaches   . Chronic kidney disease (CKD)   . Dialysis care 02/06/2010  . HTN (hypertension)   . Sleep apnea     wears the CPAP  . Type II or unspecified type diabetes mellitus with renal manifestations, uncontrolled   . Shortness of breath   . Impotence of organic origin   . Other malaise and fatigue   . Debility, unspecified   . Cellulitis and abscess of trunk   . Kidney replaced by transplant   . Acute edema of lung, unspecified   . Unspecified constipation   . Other chronic nonalcoholic liver disease   . Localization-related (focal) (partial) epilepsy and epileptic syndromes with complex partial seizures, without mention of intractable epilepsy   . Tension headache   . Dermatophytosis of the body   . Gout, unspecified   . Pain in joint, upper arm   . Pain in joint, lower leg   . Other nonspecific abnormal serum enzyme levels   . Essential hypertension, benign   . Acute, but ill-defined, cerebrovascular disease   . Memory loss   . Hypotension, unspecified   . Hypertrophy of prostate without urinary obstruction and other lower urinary tract symptoms (LUTS)   . Renal dialysis status(V45.11) 02/05/2010    restarted 01/02/13 ofter renal trransplant failure  . End stage renal disease   . Insomnia, unspecified   .  Secondary hyperparathyroidism (of renal origin)   . Lumbago   . Unspecified vitamin D deficiency   . Carpal tunnel syndrome   . 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)   . Unspecified hereditary and idiopathic peripheral neuropathy   . Other and unspecified hyperlipidemia   . Unspecified essential hypertension   . Arteriovenous fistula, acquired   . Edema   . Vasectomy status   . Obstructive sleep apnea (adult) (pediatric)     CPAP  . Cholelithiasis 07/13/2014    Past Surgical History  Procedure Laterality Date  . Inner ear surgery  1973    for deafness at age 43  . Colonoscopy    . Dialysis shunt placement    . Kidney transplant  08/17/2011    [facility]   . Placement of fistula at [facility] vein specialist    . Left heart catheterization with coronary angiogram N/A 03/21/2011    Procedure: LEFT HEART CATHETERIZATION WITH CORONARY ANGIOGRAM;  Surgeon: [name], MD;  Location: [facility] CATH LAB;  Service: Cardiovascular;  Laterality: N/A;  . Cervical disc surgery      for spinal stenosis  . Nephrectomy      removed transplaned kidney    Family History  Problem Relation Age of Onset  . Adopted: Yes  . Colon cancer Neg Hx   . Esophageal cancer Neg Hx   . Rectal cancer Neg Hx   . Stomach cancer Neg Hx     Social History:  reports that he quit smoking about 3 years ago. His smoking use included Cigarettes. He quit after 28 years of use. He has never used smokeless tobacco. He reports that he does not drink alcohol or use illicit drugs.  Allergies:  Allergies  Allergen Reactions  . Codeine Nausea And Vomiting and Nausea Only    Medications: I have reviewed the patient's current medications.  Results for orders placed or performed during the hospital encounter of 09/09/14 (from the past 48 hour(s))  Basic metabolic panel     Status: Abnormal   Collection Time: 09/09/14  4:14 PM  Result Value Ref Range   Sodium 138 135 - 145 mmol/L   Potassium 3.8 3.5 - 5.1 mmol/L    Chloride 94 (L) 101 - 111 mmol/L   CO2 31 22 - 32 mmol/L   Glucose, Bld 154 (H) 65 - 99 mg/dL   BUN 30 (H) 6 - 20 mg/dL   Creatinine, Ser 9.37 (H) 0.61 - 1.24 mg/dL   Calcium 8.8 (L) 8.9 - 10.3 mg/dL   GFR calc non Af Amer 6 (L) >60 mL/min   GFR calc Af Amer 7 (L) >60 mL/min    Comment: (NOTE) The eGFR has been calculated using the CKD EPI equation. This calculation has not been validated in all clinical situations. eGFR's persistently <60 mL/min signify possible Chronic Kidney Disease.    Anion gap 13 5 - 15  CBC     Status: Abnormal   Collection Time: 09/09/14  4:14 PM  Result Value Ref Range   WBC 6.1 4.0 - 10.5 K/uL   RBC 3.31 (L) 4.22 - 5.81 MIL/uL   Hemoglobin 11.1 (L) 13.0 - 17.0 g/dL   HCT 32.9 (L) 39.0 - 52.0 %   MCV 99.4 78.0 - 100.0 fL   MCH 33.5 26.0 - 34.0 pg   MCHC 33.7 30.0 - 36.0 g/dL   RDW 14.0 11.5 - 15.5 %   Platelets 94 (L) 150 - 400 K/uL    Comment: SPECIMEN CHECKED FOR CLOTS PLATELETS APPEAR DECREASED PLATELET COUNT CONFIRMED BY SMEAR   Troponin I     Status: None   Collection Time: 09/09/14  4:14 PM  Result Value Ref Range   Troponin I <0.03 <0.031 ng/mL    Comment:        NO INDICATION OF MYOCARDIAL INJURY.   Brain natriuretic peptide     Status: Abnormal   Collection Time: 09/09/14  4:14 PM  Result Value Ref Range   B Natriuretic Peptide 245.0 (H) 0.0 - 100.0 pg/mL  Troponin I     Status: None   Collection Time: 09/09/14  7:48 PM  Result Value Ref Range   Troponin I <0.03 <0.031 ng/mL    Comment:        NO INDICATION OF MYOCARDIAL INJURY.   D-dimer, quantitative (not at [facility])     Status: Abnormal   Collection Time: 09/09/14  9:07 PM  Result Value Ref Range   D-Dimer, Quant 1.20 (H) 0.00 - 0.48 ug/mL-FEU    Comment:        AT THE INHOUSE ESTABLISHED CUTOFF VALUE OF 0.48 ug/mL FEU, THIS ASSAY HAS BEEN DOCUMENTED IN THE LITERATURE TO HAVE A SENSITIVITY AND NEGATIVE PREDICTIVE VALUE OF AT LEAST 98 TO 99%.  THE TEST RESULT SHOULD  BE CORRELATED WITH AN ASSESSMENT OF THE CLINICAL PROBABILITY OF DVT / VTE.   Blood gas, arterial     Status: Abnormal   Collection Time: 09/09/14  9:17 PM    Result Value Ref Range   FIO2 0.28    Delivery systems NASAL CANNULA    pH, Arterial 7.447 7.350 - 7.450   pCO2 arterial 39.1 35.0 - 45.0 mmHg   pO2, Arterial 72.1 (L) 80.0 - 100.0 mmHg   Bicarbonate 26.5 (H) 20.0 - 24.0 mEq/L   TCO2 24.1 0 - 100 mmol/L   Acid-Base Excess 2.8 (H) 0.0 - 2.0 mmol/L   O2 Saturation 93.9 %   Patient temperature 37.0    Collection site RIGHT RADIAL    Drawn by [id number]    Sample type ARTERIAL DRAW    Allens test (pass/fail) PASS PASS  Comprehensive metabolic panel     Status: Abnormal   Collection Time: 09/10/14  4:30 AM  Result Value Ref Range   Sodium 136 135 - 145 mmol/L   Potassium 4.6 3.5 - 5.1 mmol/L    Comment: DELTA CHECK NOTED   Chloride 97 (L) 101 - 111 mmol/L   CO2 26 22 - 32 mmol/L   Glucose, Bld 165 (H) 65 - 99 mg/dL   BUN 47 (H) 6 - 20 mg/dL   Creatinine, Ser 10.76 (H) 0.61 - 1.24 mg/dL   Calcium 8.8 (L) 8.9 - 10.3 mg/dL   Total Protein 7.0 6.5 - 8.1 g/dL   Albumin 3.7 3.5 - 5.0 g/dL   AST 21 15 - 41 U/L   ALT 20 17 - 63 U/L   Alkaline Phosphatase 64 38 - 126 U/L   Total Bilirubin 0.9 0.3 - 1.2 mg/dL   GFR calc non Af Amer 5 (L) >60 mL/min   GFR calc Af Amer 6 (L) >60 mL/min    Comment: (NOTE) The eGFR has been calculated using the CKD EPI equation. This calculation has not been validated in all clinical situations. eGFR's persistently <60 mL/min signify possible Chronic Kidney Disease.    Anion gap 13 5 - 15    Dg Chest 2 View  09/09/2014   CLINICAL DATA:  Shortness of breath today. Productive cough. Dialysis yesterday.  EXAM: CHEST  2 VIEW  COMPARISON:  08/25/2014.  FINDINGS: Normal sized heart. Clear lungs. Central peribronchial thickening. Cervical spine fixation hardware.  IMPRESSION: No acute abnormality. Stable mild to moderate chronic bronchitic changes.    Electronically Signed   By: Claudie Revering M.D.   On: 09/09/2014 16:52    Review of Systems  Constitutional: Positive for chills. Negative for fever.  Respiratory: Positive for cough, sputum production, shortness of breath and wheezing.   Cardiovascular: Negative for chest pain and orthopnea.  Gastrointestinal: Positive for abdominal pain. Negative for nausea, vomiting, diarrhea and constipation.   Blood pressure 160/80, pulse 85, temperature 98.1 F (36.7 C), temperature source Oral, resp. rate 22, height 5' 7" (1.702 m), weight 231 lb 9.6 oz (105.053 kg), SpO2 91 %. Physical Exam  Constitutional: He is oriented to person, place, and time. No distress.  HENT:  Mouth/Throat: No oropharyngeal exudate.  Eyes: No scleral icterus.  Neck: No JVD present.  Cardiovascular: Normal rate and regular rhythm.  Exam reveals no gallop.   Respiratory: No respiratory distress. He has wheezes. He has rales.  GI: He exhibits no distension. There is no tenderness.  Musculoskeletal: He exhibits no tenderness.  Neurological: He is alert and oriented to person, place, and time.    Assessment/Plan: Problem #1 difficulty breathing, cough and wheezing. Presently thought to be secondary to exacerbation of his COPD/bronchitis. His feeling slightly better. He denies any orthopnea. Problem #2 end-stage renal disease: His status  post hemodialysis on Wednesday. Patient is due for dialysis today. His potassium is normal. Problem #3 hypertension: His blood pressure is slightly high Problem #4 COPD Problem #5 history of obstructive sleep apnea and he is on CPAP to home Problem #6 anemia: His hemoglobin is within our target range Problem #7 metabolic bone disease: His calcium is range Problem #8 fluid management: Patient does not have any significant sign of fluid overload and chest x-ray also didn't show any significant sign of fluid overload. Problem #8 diabetes Plan: We'll make arrangements for patient to get  dialysis today We'll dialyze him using 3K/2.5 calcium bath for 4 hours We'll try to remove about 3 L if his blood pressure tolerates We'll use Epogen 4000 units IV after each dialysis. We'll continue his other medications as before.  [name] S 09/10/2014, 10:11 AM

## 2014-09-10 NOTE — Care Management Important Message (Signed)
Important Message  Patient Details  Name: Kirk [name] MRN: [id number] Date of Birth: [birth year]   Medicare Important Message Given:  Yes-second notification given    Joylene Draft, RN 09/10/2014, 12:56 PM

## 2014-09-10 NOTE — Progress Notes (Signed)
Patient became very SOB just walking to the door of his room while wearing 3L [place], his SpO2 decreased to 87% .

## 2014-09-10 NOTE — Progress Notes (Signed)
PROGRESS NOTE  Kirk [name] [id number] DOB: August 31, 1960 DOA: 09/09/2014 PCP: Estill Dooms, MD  Summary: 63 yom with a hx of COPD and end stage renal disease on dialysis presented with SOB, productive cough. When ambulatory, pt desatted to 83% O2 sat. Pt was admitted for futher abuterol, abx and monitoring.     Assessment/Plan: 1. Acute respiratory failure with hypoxia. Likely secondary to COPD exacerbation.  2. COPD exacerbation. Quite symptomatic  3. Atypical chest pain, positional, aggravated by movement. EKG nonacute, troponin negative. No further workup at this point.  4. ERSD/dialysis. Plan for routine dialysis today, no evidence of volume overload  5. Essential HTN. stable 6. DM type 2. Stable  7. Anemia of CKD, stable. 8. Chronic thrombocytopenia, stable, unclear etiology.  9. Elevated ddimer. Well's = 0. No history of VTE. Previous CTA chest June and March 2016 without evidence of PE. No further evaluation suggested. 10. OSA on CPAP at night 11. Tobacco dependence, working on cessation    Overall remains symptomatic and hypoxic  Continue abx for COPD with productive sputum, steroids, bronchodilators, oxygen  Code Status: Full DVT prophylaxis: heparin  Family Communication: pt is alone, no questions at this time. Care plan was discussed in detail.  Disposition Plan: Home once improved, anticipate 2-3 days.   Murray Hodgkins, MD  [facility]  Pager [phone] If 7PM-7AM, please contact night-coverage at [email or web address], password [facility] 09/10/2014, 6:44 AM  LOS: 1 day   Consultants:  Nephrology   Procedures:    Antibiotics:  doxycycline 8/18 >>  HPI/Subjective: Pain in back, leg, chest. Reports leg pain is intermittent. CP across right side, aggravated by movement of his neck in position. Didn't sleep well. No change in shortness of breath. Onset of symptoms Tuesday night.  Mild productive cough. Still smoking.  Objective: Filed Vitals:   09/09/14  2145 09/09/14 2306 09/10/14 0449 09/10/14 0550  BP: 175/79   160/80  Pulse: 90   85  Temp: 98.4 F (36.9 C)   98.1 F (36.7 C)  TempSrc: Oral   Oral  Resp: 23   22  Height: 5\' 7"  (1.702 m)     Weight: 105.053 kg (231 lb 9.6 oz)     SpO2: 96% 92% 87% 92%    Intake/Output Summary (Last 24 hours) at 09/10/14 0644 Last data filed at 09/09/14 2145  Gross per 24 hour  Intake    240 ml  Output      0 ml  Net    240 ml     Filed Weights   09/09/14 1549 09/09/14 2145  Weight: 107.049 kg (236 lb) 105.053 kg (231 lb 9.6 oz)    Exam:     Afebrile, no hypoxia  General: Appears calm and mildly uncomfortable, sitting up in bed. Nontoxic. Eyes: PERRL, normal lids, irises & conjunctiva ENT: grossly normal hearing, lips & tongue Cardiovascular: RRR, no m/r/g. No LE edema. Telemetry: SR, no arrhythmias  Respiratory: bilateral basilar crackles posterior, no w/ rhonchi. Good air movement.  Skin: no rash or induration seen on limited exam Musculoskeletal: grossly normal tone BUE/BLE Psychiatric: grossly normal mood and affect, speech fluent and appropriate  New data reviewed:  CMP c/w consistent with ESRD  Troponin negative   Pertinent data since admission:  CXR No acute abnormality. Stable mild to moderate chronic bronchitic Changes  CT chest Bibasilar atelectasis. Stable nodular densities. Resolution of previously seen right upper lobe nodule.  EKG SR  Pending data:    Scheduled Meds: . amLODipine  10  mg Oral Daily  . antiseptic oral rinse  7 mL Mouth Rinse q12n4p  . aspirin EC  81 mg Oral Daily  . carvedilol  25 mg Oral BID WC  . chlorhexidine  15 mL Mouth Rinse BID  . cholecalciferol  5,000 Units Oral q morning - 10a  . doxazosin  2 mg Oral BID  . doxycycline  100 mg Oral Q12H  . guaiFENesin  1,200 mg Oral BID  . heparin  5,000 Units Subcutaneous 3 times per day  . ipratropium-albuterol  3 mL Nebulization Q4H  . methylPREDNISolone (SOLU-MEDROL) injection  60 mg  Intravenous Q6H  . multivitamin  1 tablet Oral QHS  . pravastatin  40 mg Oral QPM  . sodium chloride  3 mL Intravenous Q12H  . sodium chloride  3 mL Intravenous Q12H   Continuous Infusions:   Active Problems:   Acute respiratory failure with hypoxia   Pre-diabetes   COPD exacerbation   ESRD on dialysis   Essential hypertension   HLD (hyperlipidemia)   Pleuritic chest pain   Time spent 20 minutes   I, Arielle Khosrowpour, acting a scribe, recorded this note contemporaneously in the presence of Dr. Melene Plan. Sarajane Jews, M.D. on 09/10/2014  I have reviewed the above documentation for accuracy and completeness, and I agree with the above. Murray Hodgkins, MD

## 2014-09-11 LAB — BASIC METABOLIC PANEL
ANION GAP: 12 (ref 5–15)
BUN: 43 mg/dL — ABNORMAL HIGH (ref 6–20)
CO2: 27 mmol/L (ref 22–32)
Calcium: 8.9 mg/dL (ref 8.9–10.3)
Chloride: 98 mmol/L — ABNORMAL LOW (ref 101–111)
Creatinine, Ser: 8.21 mg/dL — ABNORMAL HIGH (ref 0.61–1.24)
GFR calc Af Amer: 8 mL/min — ABNORMAL LOW (ref >60)
GFR calc non Af Amer: 7 mL/min — ABNORMAL LOW (ref >60)
GLUCOSE: 266 mg/dL — AB (ref 65–99)
POTASSIUM: 4.6 mmol/L (ref 3.5–5.1)
Sodium: 137 mmol/L (ref 135–145)

## 2014-09-11 MED ORDER — PREDNISONE 20 MG PO TABS
40.0000 mg | ORAL_TABLET | Freq: Every day | ORAL | Status: DC
Start: 2014-09-12 — End: 2014-09-12
  Administered 2014-09-12: 40 mg via ORAL
  Filled 2014-09-11: qty 2

## 2014-09-11 NOTE — Progress Notes (Signed)
Subjective: Patient has some cough but is much better. Presently able to walk without much difficulty in breathing. He denies any fever chills or sweating.   Objective: Vital signs in last 24 hours: Temp:  [97.6 F (36.4 C)-98.8 F (37.1 C)] 97.8 F (36.6 C) (08/20 0629) Pulse Rate:  [79-98] 98 (08/20 0629) Resp:  [16-20] 16 (08/20 0629) BP: (143-176)/(66-90) 154/77 mmHg (08/20 0629) SpO2:  [90 %-96 %] 94 % (08/20 0748) Weight:  [229 lb 6.4 oz (104.055 kg)-240 lb 1.3 oz (108.9 kg)] 229 lb 6.4 oz (104.055 kg) (08/20 0629)  Intake/Output from previous day: 08/19 0701 - 08/20 0700 In: 583 [P.O.:580; I.V.:3] Out: 3200  Intake/Output this shift: Total I/O In: 240 [P.O.:240] Out: -    Recent Labs  09/09/14 1614  HGB 11.1*    Recent Labs  09/09/14 1614  WBC 6.1  RBC 3.31*  HCT 32.9*  PLT 94*    Recent Labs  09/10/14 0430 09/11/14 0607  NA 136 137  K 4.6 4.6  CL 97* 98*  CO2 26 27  BUN 47* 43*  CREATININE 10.76* 8.21*  GLUCOSE 165* 266*  CALCIUM 8.8* 8.9   No results for input(s): LABPT, INR in the last 72 hours.  Generally patient is alert and in no apparent distress Chest with some expiratory wheezing His heart exam reveals regular rate and rhythm no murmur Extremities no edema  Assessment/Plan: Problem #1 difficulty breathing possibly a combination of exacerbation of COPD and also fluid overload. Patient had dialysis yesterday with 3300 mL of fluid removal. Presently he is feeling much better. Problem #2 end-stage renal disease: His potassium is good and no uremic signs and symptoms Problem #3 hypertension: His blood pressure is reasonably controlled Problem #4 diabetes Problem #5 anemia: His hemoglobin is within our target goal Problem #6 metabolic bone disease: His calcium is in range. Problem #7 sleep apnea: Patient on CPAP at home. Plan: We'll continue his present management. His next dialysis would be on Monday which is his regular  schedule.    Kyanne Rials S 09/11/2014, 9:41 AM

## 2014-09-11 NOTE — Progress Notes (Signed)
PROGRESS NOTE  Kirk [name] [id number] DOB: 01/15/1961 DOA: 09/09/2014 PCP: Estill Dooms, MD  Summary: 89 yom with a hx of COPD and end stage renal disease on dialysis presented with SOB, productive cough. When ambulatory, pt desatted to 83% O2 sat. Pt was admitted for futher abuterol, abx and monitoring.     Assessment/Plan: 1. Acute respiratory failure with hypoxia. Improving, secondary to COPD exacerbation.  2. COPD exacerbation. Much improved today. CT chest without acute features. Atelectasis seen. Stable nodular densities as described. If the patient is at high risk for bronchogenic carcinoma, follow-up chest CT at 1 year is recommended 3. Atypical chest pain, positional, aggravated by movement, resolved. EKG nonacute, troponin negative. No further workup at this point.  4. ERSD/dialysis s/p HD 8/19. Plan for routine dialysis today, no evidence of volume overload  5. Essential HTN. Remains stable. 6. DM type 2. Remains stable  7. Anemia of CKD, stable. 8. Chronic thrombocytopenia, stable, unclear etiology.  9. Elevated ddimer. Well's = 0. No history of VTE. Previous CTA chest June and March 2016 without evidence of PE. No further evaluation suggested. 10. OSA on CPAP at night 11. Tobacco dependence, working on cessation    Much improved today.   Will continue abx for COPD and bronchodilators.   Will wean steroids   Code Status: Full DVT prophylaxis: heparin  Family Communication: pt is alone in the room but wife was on the phone, no questions at this time. Care plan was discussed in detail.  Disposition Plan: Home once improved, anticipate discharge home tomorrow.   Murray Hodgkins, MD  [facility]  Pager [phone] If 7PM-7AM, please contact night-coverage at [email or web address], password [facility] 09/11/2014, 7:11 AM  LOS: 2 days   Consultants:  Nephrology   Procedures:    Antibiotics:  doxycycline 8/18 >>  HPI/Subjective: Is doing very well. Is still  coughing a little bit. Is eating without difficulty but has slight nausea, no vomiting. Pain is resolving. Denies  using supplemental O2 at home but used CPAP at night. Overall breathing improved but still has DOE.  Objective: Filed Vitals:   09/10/14 2235 09/10/14 2321 09/11/14 0301 09/11/14 0629  BP: 145/76   154/77  Pulse: 88   98  Temp: 98.8 F (37.1 C)   97.8 F (36.6 C)  TempSrc: Oral   Oral  Resp: 20   16  Height:      Weight:    104.055 kg (229 lb 6.4 oz)  SpO2: 95% 92% 95% 92%    Intake/Output Summary (Last 24 hours) at 09/11/14 0711 Last data filed at 09/10/14 1935  Gross per 24 hour  Intake    583 ml  Output   3200 ml  Net  -2617 ml     Filed Weights   09/10/14 1530 09/10/14 1945 09/11/14 0629  Weight: 108.9 kg (240 lb 1.3 oz) 105.6 kg (232 lb 12.9 oz) 104.055 kg (229 lb 6.4 oz)    Exam: Afebrile, VSS General: Appears comfortable, calm. Sitting on the edge of the bed Cardiovascular: Regular rate and rhythm, no murmur, rub or gallop.  Respiratory: Clearer today with few expiratory wheezes.Decreased respiratory effort, speaks in full sentences. Psychiatric: grossly normal mood and affect, speech fluent and appropriate  New data reviewed:  BMP c/w ESRD  CT chest reviewed  Pertinent data since admission:  CXR No acute abnormality. Stable mild to moderate chronic bronchitic Changes  CT chest Bibasilar atelectasis. Stable nodular densities. Resolution of previously seen right upper lobe nodule.  EKG SR  Pending data:    Scheduled Meds: . amLODipine  10 mg Oral Daily  . antiseptic oral rinse  7 mL Mouth Rinse q12n4p  . aspirin EC  81 mg Oral Daily  . calcium acetate  2,001 mg Oral TID WC  . carvedilol  25 mg Oral BID WC  . cholecalciferol  5,000 Units Oral q morning - 10a  . doxazosin  2 mg Oral BID  . doxycycline  100 mg Oral Q12H  . guaiFENesin  1,200 mg Oral BID  . heparin  5,000 Units Subcutaneous 3 times per day  . ipratropium-albuterol   3 mL Nebulization Q4H  . methylPREDNISolone (SOLU-MEDROL) injection  60 mg Intravenous Q6H  . multivitamin  1 tablet Oral QHS  . pravastatin  40 mg Oral QPM  . sodium chloride  3 mL Intravenous Q12H  . sodium chloride  3 mL Intravenous Q12H   Continuous Infusions:   Principal Problem:   Acute respiratory failure with hypoxia Active Problems:   DM (diabetes mellitus), type 2 with renal complications   COPD exacerbation   ESRD on dialysis   Essential hypertension   HLD (hyperlipidemia)   Pleuritic chest pain   Time spent 25 minutes   I, Jessica D. Leonie Green, acting as scribe, recorded this note contemporaneously in the presence of Dr. Melene Plan. Sarajane Jews, M.D. on 09/11/2014 .   I have reviewed the above documentation for accuracy and completeness, and I agree with the above. Murray Hodgkins, MD

## 2014-09-11 NOTE — Progress Notes (Signed)
Patient ambulated around desk twice, ambulated around desk 1.5 times on room air, oxygen saturation was 90%-92%.  For last approximately 50 feet, patient placed on oxygen because of c/o of sob, but continued to ambulate with minimal assistance to room.  Patient tolerated ambulation well, respiratory in room to give patient breathing treatment.

## 2014-09-12 DIAGNOSIS — D696 Thrombocytopenia, unspecified: Secondary | ICD-10-CM

## 2014-09-12 MED ORDER — PREDNISONE 10 MG PO TABS: ORAL_TABLET | ORAL | Status: DC

## 2014-09-12 MED ORDER — DOXYCYCLINE HYCLATE 100 MG PO TABS: 100.0000 mg | ORAL_TABLET | Freq: Two times a day (BID) | ORAL | Status: DC

## 2014-09-12 MED ORDER — ACETAMINOPHEN 500 MG PO TABS: 500.0000 mg | ORAL_TABLET | Freq: Four times a day (QID) | ORAL | Status: DC | PRN

## 2014-09-12 MED ORDER — IPRATROPIUM-ALBUTEROL 18-103 MCG/ACT IN AERO: 2.0000 | INHALATION_SPRAY | Freq: Four times a day (QID) | RESPIRATORY_TRACT | Status: DC

## 2014-09-12 NOTE — Progress Notes (Signed)
PROGRESS NOTE  Kirk [name] [id number] DOB: October 26, 1960 DOA: 09/09/2014 PCP: Estill Dooms, MD  Summary: 40 yom with a hx of COPD and end stage renal disease on dialysis presented with SOB, productive cough. When ambulatory, pt desatted to 83% O2 sat. Pt was admitted for futher abuterol, abx and monitoring.     Assessment/Plan: 1. Acute respiratory failure with hypoxia, secondary to COPD exacerbation and volume overload. Resolved.  2. COPD exacerbation. Overall improving. CT chest without acute features. Atelectasis seen. Stable nodular densities as described. If the patient is at high risk for bronchogenic carcinoma, follow-up chest CT at 1 year is recommended 3. Atypical chest pain, positional, aggravated by movement, resolved. EKG nonacute, troponin negative.  4. ERSD/dialysis s/p HD 8/19. Plan next HD 8/22, no evidence of volume overload  5. Essential HTN. Remains stable. 6. DM type 2. Remains stable  7. Anemia of CKD, stable. 8. Chronic thrombocytopenia, stable, unclear etiology.  9. Elevated ddimer. Well's = 0. No history of VTE. Previous CTA chest June and March 2016 without evidence of PE. No further evaluation suggested. 10. OSA on CPAP at night 11. Tobacco dependence, working on cessation    Overall improved, does not qualify for home oxygen   Continue steroids and abx on discharge  Consider chest CT 1 year to follow-up nodular lung densities.  Consider further evaluation thrombocytopenia as an outpatient.  Code Status: Full DVT prophylaxis: heparin  Family Communication: Discussed with patient who understands and has no concerns at this time. Disposition Plan: Discharge home today  Murray Hodgkins, MD  [facility]  Pager [phone] If 7PM-7AM, please contact night-coverage at [email or web address], password [facility] 09/12/2014, 6:53 AM  LOS: 3 days   Consultants:  Nephrology   Procedures:    Antibiotics:  doxycycline 8/18 >>  HPI/Subjective: Was able to  sleep without difficulty last night. DOE has increased with mild cough. Able to eat without difficulty, no nausea and vomiting. Wants to go home today.  Objective: Filed Vitals:   09/11/14 2217 09/11/14 2306 09/12/14 0352 09/12/14 0505  BP: 168/70   164/78  Pulse: 84   85  Temp: 98.3 F (36.8 C)   97.9 F (36.6 C)  TempSrc: Oral   Oral  Resp: 20   21  Height:      Weight:      SpO2: 95% 96% 96% 98%    Intake/Output Summary (Last 24 hours) at 09/12/14 0653 Last data filed at 09/11/14 1755  Gross per 24 hour  Intake    840 ml  Output      0 ml  Net    840 ml     Filed Weights   09/10/14 1530 09/10/14 1945 09/11/14 0629  Weight: 108.9 kg (240 lb 1.3 oz) 105.6 kg (232 lb 12.9 oz) 104.055 kg (229 lb 6.4 oz)    Exam: Afebrile, VSS General:  Appears calm and comfortable. Appears better, sitting on edge of bed. Cardiovascular: RRR, no m/r/g. No LE edema. Respiratory: Bilateral expiratory wheeze, mild increased respiratory effort. Able to speak in full sentences.  Psychiatric: grossly normal mood and affect, speech fluent and appropriate  New data reviewed:    Pertinent data since admission:  CXR No acute abnormality. Stable mild to moderate chronic bronchitic Changes  CT chest Bibasilar atelectasis. Stable nodular densities. Resolution of previously seen right upper lobe nodule.  EKG SR  Pending data:    Scheduled Meds: . amLODipine  10 mg Oral Daily  . antiseptic oral rinse  7 mL  Mouth Rinse q12n4p  . aspirin EC  81 mg Oral Daily  . calcium acetate  2,001 mg Oral TID WC  . carvedilol  25 mg Oral BID WC  . cholecalciferol  5,000 Units Oral q morning - 10a  . doxazosin  2 mg Oral BID  . doxycycline  100 mg Oral Q12H  . guaiFENesin  1,200 mg Oral BID  . heparin  5,000 Units Subcutaneous 3 times per day  . ipratropium-albuterol  3 mL Nebulization Q4H  . multivitamin  1 tablet Oral QHS  . pravastatin  40 mg Oral QPM  . predniSONE  40 mg Oral Q breakfast  .  sodium chloride  3 mL Intravenous Q12H  . sodium chloride  3 mL Intravenous Q12H   Continuous Infusions:   Principal Problem:   Acute respiratory failure with hypoxia Active Problems:   DM (diabetes mellitus), type 2 with renal complications   COPD exacerbation   ESRD on dialysis   Essential hypertension   HLD (hyperlipidemia)   Pleuritic chest pain   I, Jessica D. Leonie Green, acting as scribe, recorded this note contemporaneously in the presence of Dr. Melene Plan. Sarajane Jews, M.D. on 09/12/2014 .   I have reviewed the above documentation for accuracy and completeness, and I agree with the above. Murray Hodgkins, MD

## 2014-09-12 NOTE — Progress Notes (Signed)
Subjective: Patient is feeling much better except exertional dyspnea. No orthopnea. Cough is better   Objective: Vital signs in last 24 hours: Temp:  [97.8 F (36.6 C)-98.3 F (36.8 C)] 97.9 F (36.6 C) (08/21 0505) Pulse Rate:  [84-90] 85 (08/21 0505) Resp:  [16-21] 21 (08/21 0505) BP: (149-168)/(70-78) 164/78 mmHg (08/21 0505) SpO2:  [87 %-98 %] 96 % (08/21 0713) Weight:  [234 lb 14.4 oz (106.55 kg)] 234 lb 14.4 oz (106.55 kg) (08/21 0505)  Intake/Output from previous day: 08/20 0701 - 08/21 0700 In: 840 [P.O.:840] Out: -  Intake/Output this shift:     Recent Labs  09/09/14 1614  HGB 11.1*    Recent Labs  09/09/14 1614  WBC 6.1  RBC 3.31*  HCT 32.9*  PLT 94*    Recent Labs  09/10/14 0430 09/11/14 0607  NA 136 137  K 4.6 4.6  CL 97* 98*  CO2 26 27  BUN 47* 43*  CREATININE 10.76* 8.21*  GLUCOSE 165* 266*  CALCIUM 8.8* 8.9   No results for input(s): LABPT, INR in the last 72 hours.  Generally patient is alert and in no apparent distress Chest with some expiratory wheezing His heart exam reveals regular rate and rhythm no murmur Extremities no edema  Assessment/Plan: Problem #1 difficulty breathing possibly a combination of exacerbation of COPD and also fluid overload. Patient continue to feel better. Still has exertional dyspnea on and off and long standing Problem #2 end-stage renal disease: His potassium is good and no uremic signs and symptoms. Patient status post dialysis on Friday Problem #3 hypertension: His blood pressure is reasonably controlled Problem #4 diabetes Problem #5 anemia: His hemoglobin is within our target goal. Patient is on Epogen Problem #6 metabolic bone disease: His calcium is in range. Problem #7 sleep apnea: Patient on CPAP at home. Plan: We'll continue his present management. His next dialysis will be tomorrow. He will go to his regular center if discharged other wise I will make arrangement in am    [facility]  S 09/12/2014, 9:09 AM

## 2014-09-12 NOTE — Progress Notes (Signed)
Pt IV removed, tolerated well.  Reviewed discharge instructions with pt, answered all questions.  Will continune to monitor pt until leaves the floor.

## 2014-09-12 NOTE — Discharge Summary (Signed)
Physician Discharge Summary  Kirk [name] [id number] DOB: 11/16/1960 DOA: 09/09/2014  PCP: Estill Dooms, MD  Admit date: 09/09/2014 Discharge date: 09/12/2014  Recommendations for Outpatient Follow-up:   F/u resolution of COPD exacerbation.  Consider referral to a pulmonologist, defer to PCP by patient request   Changed Combivent 4x a day vs as needed. Finish doxycycline and prednisone taper 1. Consider chest CT 1 year to follow-up nodular lung densities. 2. Consider further evaluation thrombocytopenia as an outpatient. 3. Continue to encourage smoking cessation  Follow-up Information    Follow up with GREEN, [name], MD. Schedule an appointment as soon as possible for a visit in 1 week.   Specialty:  Internal Medicine   Contact information:   [place] [address] [phone]        Discharge Diagnoses:  1. Acute respiratory failure with hypoxia, secondary to COPD exacerbation and volume overload.  2. COPD exacerbation.  3. Atypical chest pain. 4. ERSD with volume overload on admission (not CHF) 5. Essential HTN.  6. DM type 2.  7. Anemia of CKD. 8. Chronic thrombocytopenia. 9. OSA  10. Tobacco dependence.  Discharge Condition: Improved  Disposition: Discharge home  Diet recommendation: Heart healthy   Filed Weights   09/10/14 1530 09/10/14 1945 09/11/14 0629  Weight: 108.9 kg (240 lb 1.3 oz) 105.6 kg (232 lb 12.9 oz) 104.055 kg (229 lb 6.4 oz)    History of present illness:  54 yom with a hx of COPD and end stage renal disease on dialysis presented with SOB, productive cough. When ambulatory, pt desatted to 83% O2 sat. Pt was admitted for futher abuterol, abx and monitoring.  Hospital Course:  Kirk [name] was treated with steroids and bronchodilators with great improvement in his COPD exacerbation and his acute respiratory failure. EKG was non acute and CT chest was without acute features, although atelectasis was noted. A follow  up CT scan was recommended in one year if he is high risk for bronchogenic carcinoma. Atypical chest pain was resolved through rest and there was no clinical evidence to suggest PE. Chronic thrombocytopenia remains stable. Upon discharge will continue doxycycline for resolution of cough and Prednisone taper. Hospitalization was uncomplicated.   Individual issues as below:  1. Acute respiratory failure with hypoxia, secondary to COPD exacerbation and volume overload. Resolved.  2. COPD exacerbation. Overall improving. CT chest without acute features. Atelectasis seen. Stable nodular densities as described. If the patient is at high risk for bronchogenic carcinoma, follow-up chest CT at 1 year is recommended 3. Atypical chest pain, positional, aggravated by movement, resolved. EKG nonacute, troponin negative.  4. ERSD/dialysis s/p HD 8/19. Plan next HD 8/22, no evidence of volume overload  5. Essential HTN. Remains stable. 6. DM type 2. Remains stable  7. Anemia of CKD, stable. 8. Chronic thrombocytopenia, stable, unclear etiology.  9. Elevated ddimer. Well's = 0. No history of VTE. Previous CTA chest June and March 2016 without evidence of PE. No further evaluation suggested. 10. OSA on CPAP at night 11. Tobacco dependence, working on cessation  Consultants:  Nephrology  Procedures:  None  Antibiotics: doxycycline 8/18 >>    Discharge Instructions Discharge Instructions    Diet - low sodium heart healthy    Complete by:  As directed      Diet Carb Modified    Complete by:  As directed      Discharge instructions    Complete by:  As directed   Call your physician  or seek immediate medical attention for shortness of breath, fever, increased wheezing or worsening of condition. Limit Tylenol intake to no more than 4 grams per day.     Increase activity slowly    Complete by:  As directed             Current Discharge Medication List    START taking these medications    Details  doxycycline (VIBRA-TABS) 100 MG tablet Take 1 tablet (100 mg total) by mouth every 12 (twelve) hours. Qty: 7 tablet, Refills: 0    predniSONE (DELTASONE) 10 MG tablet Take 40 mg by mouth daily for 3 days, then take 20 mg by mouth daily for 3 days, then take 10 mg by mouth daily for 3 days, then stop. Qty: 21 tablet, Refills: 0      CONTINUE these medications which have CHANGED   Details  acetaminophen (TYLENOL) 500 MG tablet Take 1 tablet (500 mg total) by mouth every 6 (six) hours as needed for moderate pain.    albuterol-ipratropium (COMBIVENT) 18-103 MCG/ACT inhaler Inhale 2 puffs into the lungs 4 (four) times daily.      CONTINUE these medications which have NOT CHANGED   Details  albuterol (PROVENTIL) (2.5 MG/3ML) 0.083% nebulizer solution Take 3 mLs (2.5 mg total) by nebulization every 6 (six) hours as needed for wheezing. Qty: 75 mL, Refills: 12    amLODipine (NORVASC) 10 MG tablet Take 1 tablet (10 mg total) by mouth daily. Qty: 90 tablet, Refills: 1    aspirin EC 81 MG tablet Take 81 mg by mouth daily.    b complex-vitamin c-folic acid (NEPHRO-VITE) 0.8 MG TABS tablet TAKE 1 TABLET BY MOUTH EVERY DAY Qty: 30 tablet, Refills: 2    calcium acetate, Phos Binder, (PHOSLYRA) 667 MG/5ML SOLN Take 2,001 mg by mouth 3 (three) times daily with meals. And 667 mg with snack    carvedilol (COREG) 25 MG tablet Take 1 tablet (25 mg total) by mouth 2 (two) times daily. Take one tablet in the morning and one tablet in the evening Qty: 180 tablet, Refills: 1    Cholecalciferol 5000 UNITS TABS Take 5,000 Units by mouth every morning.    diphenhydrAMINE (BENADRYL) 25 MG tablet Take 50 mg by mouth 2 (two) times daily.     doxazosin (CARDURA) 4 MG tablet Take 0.5 tablet  by mouth in the am and 0.5 tablet by mouth in the pm to regulate blood pressure. Qty: 90 tablet, Refills: 1    guaiFENesin-dextromethorphan (ROBITUSSIN DM) 100-10 MG/5ML syrup Take 10 mLs by mouth every 4 (four)  hours as needed for cough.    pravastatin (PRAVACHOL) 40 MG tablet Take 1 tablet (40 mg total) by mouth every evening. Qty: 90 tablet, Refills: 1    promethazine (PHENERGAN) 25 MG tablet Take 1 tablet (25 mg total) by mouth every 8 (eight) hours as needed for nausea or vomiting. Qty: 90 tablet, Refills: 1    oxyCODONE-acetaminophen (PERCOCET/ROXICET) 5-325 MG per tablet Take 1-2 tablets by mouth every 8 (eight) hours as needed for severe pain. Qty: 14 tablet, Refills: 0    traMADol (ULTRAM) 50 MG tablet One up to 4 times daly if needed for pain Qty: 100 tablet, Refills: 3   Associated Diagnoses: Tension headache       Allergies  Allergen Reactions  . Codeine Nausea And Vomiting and Nausea Only    The results of significant diagnostics from this hospitalization (including imaging, microbiology, ancillary and laboratory) are listed below for reference.  Significant Diagnostic Studies: Dg Chest 2 View  09/09/2014   CLINICAL DATA:  Shortness of breath today. Productive cough. Dialysis yesterday.  EXAM: CHEST  2 VIEW  COMPARISON:  08/25/2014.  FINDINGS: Normal sized heart. Clear lungs. Central peribronchial thickening. Cervical spine fixation hardware.  IMPRESSION: No acute abnormality. Stable mild to moderate chronic bronchitic changes.   Electronically Signed   By: Claudie Revering M.D.   On: 09/09/2014 16:52   Ct Chest Wo Contrast  09/10/2014   CLINICAL DATA:  COPD exacerbation, tobacco use  EXAM: CT CHEST WITHOUT CONTRAST  TECHNIQUE: Multidetector CT imaging of the chest was performed following the standard protocol without IV contrast.  COMPARISON:  09/09/2014, 06/23/2014  FINDINGS: Lungs are well aerated bilaterally. Minimal patchy infiltrate/atelectasis  The visualized upper abdomen shows some nonobstructing renal calculi as well as cholelithiasis. No acute bony abnormality is seen. Is noted in the lower lobes bilaterally slightly worse on the left than the right. A few small less than 4  mm nodules are noted. Most prominent these is noted on image number 28 of series 3. The previously seen nodular density in the right upper lobe is no longer identified.  The thoracic inlet is within normal limits. Aortic calcifications are noted without aneurysmal dilatation. Mild coronary calcifications are seen. No hilar or mediastinal adenopathy is noted  The upper abdomen reveals evidence of cholelithiasis and nonobstructing renal calculi. No acute bony abnormality is noted.  IMPRESSION: Bibasilar atelectasis/ infiltrate this is increased slightly in the interval from the prior CT.  Stable nodular densities as described. If the patient is at high risk for bronchogenic carcinoma, follow-up chest CT at 1 year is recommended. If the patient is at low risk, no follow-up is needed. This recommendation follows the consensus statement: Guidelines for Management of Small Pulmonary Nodules Detected on CT Scans: A Statement from the [place] as published in Radiology 2005; 237:395-400.  Resolution of previously seen right upper lobe nodule consistent with prior inflammatory change.  Cholelithiasis and nonobstructing renal calculi.   Electronically Signed   By: Inez Catalina M.D.   On: 09/10/2014 11:54     Microbiology: Recent Results (from the past 240 hour(s))  MRSA PCR Screening     Status: None   Collection Time: 09/10/14  3:50 PM  Result Value Ref Range Status   MRSA by PCR NEGATIVE NEGATIVE Final    Comment:        The GeneXpert MRSA Assay (FDA approved for NASAL specimens only), is one component of a comprehensive MRSA colonization surveillance program. It is not intended to diagnose MRSA infection nor to guide or monitor treatment for MRSA infections.      Labs: Basic Metabolic Panel:  Recent Labs Lab 09/09/14 1614 09/10/14 0430 09/11/14 0607  NA 138 136 137  K 3.8 4.6 4.6  CL 94* 97* 98*  CO2 31 26 27   GLUCOSE 154* 165* 266*  BUN 30* 47* 43*  CREATININE 9.37* 10.76*  8.21*  CALCIUM 8.8* 8.8* 8.9   Liver Function Tests:  Recent Labs Lab 09/10/14 0430  AST 21  ALT 20  ALKPHOS 64  BILITOT 0.9  PROT 7.0  ALBUMIN 3.7   CBC:  Recent Labs Lab 09/09/14 1614  WBC 6.1  HGB 11.1*  HCT 32.9*  MCV 99.4  PLT 94*   Cardiac Enzymes:  Recent Labs Lab 09/09/14 1614 09/09/14 1948  TROPONINI <0.03 <0.03   BNP: BNP (last 3 results)  Recent Labs  04/06/14 0340 09/09/14 1614  BNP  264.0* 245.0*    Principal Problem:   Acute respiratory failure with hypoxia Active Problems:   DM (diabetes mellitus), type 2 with renal complications   COPD exacerbation   ESRD on dialysis   Essential hypertension   HLD (hyperlipidemia)   Pleuritic chest pain   Time coordinating discharge: 35 minutes   Signed:  Murray Hodgkins, MD [facility] 09/12/2014, 6:56 AM   I, Laban Emperor. Leonie Green, acting as scribe, recorded this note contemporaneously in the presence of Dr. Melene Plan. Sarajane Jews, M.D. on 09/12/2014 .  I have reviewed the above documentation for accuracy and completeness, and I agree with the above. Murray Hodgkins, MD

## 2014-09-12 NOTE — Progress Notes (Signed)
SATURATION QUALIFICATIONS: (This note is used to comply with regulatory documentation for home oxygen)  Patient Saturations on Room Air at Rest = 96%  Patient Saturations on Room Air while Ambulating = 94%  Patient Saturations on 2 Liters of oxygen while Ambulating = 96%  Please briefly explain why patient needs home oxygen:  No need for oxygen at this time

## 2014-09-13 DIAGNOSIS — N2581 Secondary hyperparathyroidism of renal origin: Secondary | ICD-10-CM | POA: Diagnosis not present

## 2014-09-13 DIAGNOSIS — D509 Iron deficiency anemia, unspecified: Secondary | ICD-10-CM | POA: Diagnosis not present

## 2014-09-13 DIAGNOSIS — D631 Anemia in chronic kidney disease: Secondary | ICD-10-CM | POA: Diagnosis not present

## 2014-09-13 DIAGNOSIS — N186 End stage renal disease: Secondary | ICD-10-CM | POA: Diagnosis not present

## 2014-09-14 ENCOUNTER — Encounter (HOSPITAL_COMMUNITY): Payer: Self-pay

## 2014-09-14 ENCOUNTER — Encounter (HOSPITAL_COMMUNITY)
Admission: RE | Admit: 2014-09-14 | Discharge: 2014-09-14 | Disposition: A | Discharge status: Home / Self Care | Payer: Medicare Other | Source: Ambulatory Visit | Attending: General Surgery | Admitting: General Surgery

## 2014-09-14 DIAGNOSIS — Z992 Dependence on renal dialysis: Secondary | ICD-10-CM | POA: Insufficient documentation

## 2014-09-14 DIAGNOSIS — G4733 Obstructive sleep apnea (adult) (pediatric): Secondary | ICD-10-CM | POA: Insufficient documentation

## 2014-09-14 DIAGNOSIS — K8020 Calculus of gallbladder without cholecystitis without obstruction: Secondary | ICD-10-CM | POA: Insufficient documentation

## 2014-09-14 DIAGNOSIS — R413 Other amnesia: Secondary | ICD-10-CM | POA: Diagnosis not present

## 2014-09-14 DIAGNOSIS — Z7982 Long term (current) use of aspirin: Secondary | ICD-10-CM | POA: Insufficient documentation

## 2014-09-14 DIAGNOSIS — I120 Hypertensive chronic kidney disease with stage 5 chronic kidney disease or end stage renal disease: Secondary | ICD-10-CM | POA: Insufficient documentation

## 2014-09-14 DIAGNOSIS — Z79899 Other long term (current) drug therapy: Secondary | ICD-10-CM | POA: Diagnosis not present

## 2014-09-14 DIAGNOSIS — N186 End stage renal disease: Secondary | ICD-10-CM | POA: Diagnosis not present

## 2014-09-14 DIAGNOSIS — Z01818 Encounter for other preprocedural examination: Secondary | ICD-10-CM | POA: Insufficient documentation

## 2014-09-14 DIAGNOSIS — Z01812 Encounter for preprocedural laboratory examination: Secondary | ICD-10-CM | POA: Diagnosis not present

## 2014-09-14 DIAGNOSIS — N2581 Secondary hyperparathyroidism of renal origin: Secondary | ICD-10-CM | POA: Insufficient documentation

## 2014-09-14 HISTORY — DX: Pneumonia, unspecified organism: J18.9

## 2014-09-14 HISTORY — DX: Cardiac arrhythmia, unspecified: I49.9

## 2014-09-14 LAB — BASIC METABOLIC PANEL
Anion gap: 16 — ABNORMAL HIGH (ref 5–15)
BUN: 78 mg/dL — AB (ref 6–20)
CALCIUM: 7.9 mg/dL — AB (ref 8.9–10.3)
CO2: 24 mmol/L (ref 22–32)
CREATININE: 11.13 mg/dL — AB (ref 0.61–1.24)
Chloride: 99 mmol/L — ABNORMAL LOW (ref 101–111)
GFR, EST AFRICAN AMERICAN: 5 mL/min — AB (ref >60)
GFR, EST NON AFRICAN AMERICAN: 5 mL/min — AB (ref >60)
Glucose, Bld: 200 mg/dL — ABNORMAL HIGH (ref 65–99)
Potassium: 4.7 mmol/L (ref 3.5–5.1)
SODIUM: 139 mmol/L (ref 135–145)

## 2014-09-14 LAB — CBC
HCT: 29.7 % — ABNORMAL LOW (ref 39.0–52.0)
Hemoglobin: 10 g/dL — ABNORMAL LOW (ref 13.0–17.0)
MCH: 32.8 pg (ref 26.0–34.0)
MCHC: 33.7 g/dL (ref 30.0–36.0)
MCV: 97.4 fL (ref 78.0–100.0)
PLATELETS: 91 10*3/uL — AB (ref 150–400)
RBC: 3.05 MIL/uL — AB (ref 4.22–5.81)
RDW: 14.1 % (ref 11.5–15.5)
WBC: 6.4 10*3/uL (ref 4.0–10.5)

## 2014-09-14 LAB — GLUCOSE, CAPILLARY: GLUCOSE-CAPILLARY: 158 mg/dL — AB (ref 65–99)

## 2014-09-14 NOTE — Progress Notes (Signed)
Anesthesia PAT Evaluation: Patient is a 54 year old male scheduled for laparoscopic cholecystectomy on 09/23/14 by Dr. Marlou Starks.  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 Epilepsy is in his history but he denied.  PCP is Dr. Jeanmarie Hubert with [facility], next visit 09/15/14 at 1500.  He was recently admitted to [facility] on 09/09/14 - 09/12/14 for COPD exacerbation. Troponin negative. His PAT RN reported that when he first arrived in her office he appeared acutely SOB with improvement over a few minutes. This occurred after walking from the PAT entrance to our first exam room. She asked me to evaluate him. Vitals as below. When I arrived, patient was sitting in a chair without acute SOB or conversational dyspnea. He would close his eyes intermittently because he "felt tired" but was easily arousable and answered questions appropriately. He continues to smoke, but is trying to cut down. He has about four more days of his steroid taper and two more doses of antibiotic therapy. He is using his nebulizer Q 4-6 hours and taking Robitussin DM PRN for cough. Apparently, he was up coughing most of the night and had approximately 40 minute coughing spell prior to PAT. Cough has small amount clear phlegm. No chest pain. No fever or chills. Last low grade fever was on Friday night. Breathing still doesn't feel at baseline. Occasional SOB at rest. He is using CPAP. No home O2.  On exam, heart RRR, no murmur noted. Lungs were slightly diminished in bases, but otherwise no wheezes or rhonchi noted. Occasional isolated cough during my evaluation, no persistent coughing. Since patient still reports he  does not much better since his discharge two days ago, I offered to send him to the ED for further evaluation. He was reluctant since he was scheduled to see Dr. Nyoka Cowden tomorrow. I did advise that we would have him walk our hallway on [place]. If he had any desaturations then I would advise he go to the ED. However, with walking our PAT hallway his O2 sat did not drop below 93% and went back up to 95-96% with sitting. HR was in the 80's during ambulation. During my encounter with Kirk [name] and his wife, did not exhibit any conversational dyspnea or labored breathing. He did not develop the the acute SOB with walking in our hallway that reportedly he had when he arrived. His vitals were stable in PAT. He is having hemodialysis in the morning and is seeing Dr. Nyoka Cowden tomorrow afternoon. Both patient and his wife did not want him to go to the ED. They felt they could continue with his current treatment at home. They were advised that if he develops acute SOB or other new/worsening symptoms that he should contact EMS.  PAT Vitals: BP 153/68, RR 20, O2 94%, HR 83, T 36.9.  Meds include albuterol, amlodipine, ASA 81 mg, Coreg, doxazosin, doxycycline, Percocet, pravastatin, prednisone taper, promethazine, tramadol.  09/09/14 EKG: SR, borderline prolonged QT interval, baseline wander in inferior leads and V4-6.  04/13/14 Echo: - Left ventricle: Mild to moderate left ventricular enlargement. Wall thickness was increased in a pattern of mild LVH. Systolic function was normal. The estimated ejection fraction was in the range of 55% to 60%. Left ventricular diastolic function parameters were normal. -  Tricuspid valve: There was mild regurgitation. - Pulmonary arteries: Incomplete tricuspid regurgitant jet to accurately assess pulmonary pressures.  04/23/13 Nuclear stress test ([facility], Care Everywhere): FINAL COMMENTS 1. Negative for regadenoson induced ischemia. 2. Mild fixed inferior region compatible with  diaphragm attenuation artifact. 3. Normal LV wall motion, and normal LVEF.  09/09/14 1V CXR: IMPRESSION: No acute abnormality. Stable mild to moderate chronic bronchitic changes.  09/10/14 Chest CT: IMPRESSION:  1. Bibasilar atelectasis/ infiltrate this is increased slightly in the interval from the prior CT. 2. Stable nodular densities as described. If the patient is at high risk for bronchogenic carcinoma, follow-up chest CT at 1 year is recommended. If the patient is at low risk, no follow-up is needed. This recommendation follows the consensus statement: Guidelines for Management of Small Pulmonary Nodules Detected on CT Scans: A Statement from the [place] as published in Radiology 2005; 237:395-400. 3. Resolution of previously seen right upper lobe nodule consistent with prior inflammatory change. 4. Cholelithiasis and nonobstructing renal calculi.  06/22/10 Carotid duplex: IMPRESSION: No evidence for carotid artery stenosis.  Labs repeated today since he was recently hospitalized and still not feeling at baseline. WBC 6.4. H/H 10.0/29.7, PLT 91K (wide PLT range from 79K-172K since 01/2014; most often < 150K). LFTS on 09/10/14 WNL. A1C pending. I will forward PLT count result to Dr. Marlou Starks.   I will forward labs to Dr. Nyoka Cowden and notify him of currently scheduled surgery plans. I advised patient for elective surgery, he should be recovered from his COPD exacerbation. Surgery is still 9 days away. I will follow-up on any recommendations from Dr. Nyoka Cowden. I have updated [facility] triage nurse Raquel Sarna.  Kirk [name] [facility] Short Stay Center/Anesthesiology Phone [phone] 09/14/2014 5:28 PM

## 2014-09-14 NOTE — Pre-Procedure Instructions (Addendum)
    CARTER AUTH  09/14/2014      [facility] [address] [place] [address] Phone: [phone] Fax: [phone]  [id number] [place] - [address] AT [facility] [place] [address] Phone: [phone] Fax: [phone]    Your procedure is scheduled on Thursday, September 1st, 2016  Report to [facility] Admitting at 9:30 A.M.  Call this number if you have problems the morning of surgery:  [phone]   Remember:  Do not eat food or drink liquids after midnight.   Take these medicines the morning of surgery with A SIP OF WATER: Acetaminophen (tylenol) if needed,  Albuterol nebulizer, Albuterol-ipratropium (please bring with you), Amlodipine (Norcasc), Carvedilol (Creg), Doxazosin (Cardura), Oxycodone-Acetaminophen (Percocet), Prednisone (Deltasone), Promethazine (Phenergan), Tramadol (Ultram), Guaifenesin-Dextromethorphan (Robitussin DM).   Stop taking the following 7 days prior to surgery: NSAIDS, Aspirin, Ibuprofen, Naproxen, Aleve, BC's, Goody's, Fish Oil, all herbal medications, and all vitamins.    Do not wear jewelry, make-up or nail polish.  Do not wear lotions, powders, or perfumes.  You may wear deodorant.  Do not shave 48 hours prior to surgery.  Men may shave face and neck.  Do not bring valuables to the hospital.  [facility] is not responsible for any belongings or valuables.  Contacts, dentures or bridgework may not be worn into surgery.  Leave your suitcase in the car.  After surgery it may be brought to your room.  For patients admitted to the hospital, discharge time will be determined by your treatment team.  Patients discharged the day of surgery will not be allowed to drive home.   Special instructions:  See attached.   Please read over the following fact sheets that you were given. Pain  Booklet, Coughing and Deep Breathing and Surgical Site Infection Prevention

## 2014-09-14 NOTE — Progress Notes (Signed)
PCP- Dr. Elder Negus- Pt. Has scheduled appointment 09/15/14 Cardiologist - Dr. Debara Pickett - Pt states that he has not seen recently  EKG-08/2014 - Epic CXR - 08/2014 Echo- March 2016 - Epic Stress Test - 04/2013- [facility]where Cardiac Cath - 2013 - Epic  Pt. Presents to PAT appointment with shortness of breath, rapid respirations and complains of coughing.  Patient vitals are stable.  O2 sats 94% on room air.  Myra Gianotti, PA notified of patient's condition.  After sitting, patient's respirations decreased.  Patient ambulated 120 ft without oxygen with O2 sats at 93%.  Patient returned to chair, and sats increased to 95% on room air.

## 2014-09-15 ENCOUNTER — Encounter: Payer: Self-pay | Admitting: Internal Medicine

## 2014-09-15 ENCOUNTER — Ambulatory Visit (INDEPENDENT_AMBULATORY_CARE_PROVIDER_SITE_OTHER): Payer: Medicare Other | Admitting: Internal Medicine

## 2014-09-15 VITALS — BP 150/70 | HR 82 | Temp 98.8°F | Resp 16 | Ht 67.5 in | Wt 233.0 lb

## 2014-09-15 DIAGNOSIS — D509 Iron deficiency anemia, unspecified: Secondary | ICD-10-CM | POA: Diagnosis not present

## 2014-09-15 DIAGNOSIS — N189 Chronic kidney disease, unspecified: Secondary | ICD-10-CM

## 2014-09-15 DIAGNOSIS — J441 Chronic obstructive pulmonary disease with (acute) exacerbation: Secondary | ICD-10-CM | POA: Diagnosis not present

## 2014-09-15 DIAGNOSIS — R609 Edema, unspecified: Secondary | ICD-10-CM

## 2014-09-15 DIAGNOSIS — J9601 Acute respiratory failure with hypoxia: Secondary | ICD-10-CM

## 2014-09-15 DIAGNOSIS — Z9989 Dependence on other enabling machines and devices: Secondary | ICD-10-CM

## 2014-09-15 DIAGNOSIS — G4733 Obstructive sleep apnea (adult) (pediatric): Secondary | ICD-10-CM | POA: Diagnosis not present

## 2014-09-15 DIAGNOSIS — I10 Essential (primary) hypertension: Secondary | ICD-10-CM | POA: Diagnosis not present

## 2014-09-15 DIAGNOSIS — E1121 Type 2 diabetes mellitus with diabetic nephropathy: Secondary | ICD-10-CM | POA: Diagnosis not present

## 2014-09-15 DIAGNOSIS — N186 End stage renal disease: Secondary | ICD-10-CM | POA: Diagnosis not present

## 2014-09-15 DIAGNOSIS — K8020 Calculus of gallbladder without cholecystitis without obstruction: Secondary | ICD-10-CM

## 2014-09-15 DIAGNOSIS — D631 Anemia in chronic kidney disease: Secondary | ICD-10-CM | POA: Diagnosis not present

## 2014-09-15 DIAGNOSIS — N2581 Secondary hyperparathyroidism of renal origin: Secondary | ICD-10-CM | POA: Diagnosis not present

## 2014-09-15 LAB — HEMOGLOBIN A1C
HEMOGLOBIN A1C: 6.7 % — AB (ref 4.8–5.6)
MEAN PLASMA GLUCOSE: 146 mg/dL

## 2014-09-17 DIAGNOSIS — D509 Iron deficiency anemia, unspecified: Secondary | ICD-10-CM | POA: Diagnosis not present

## 2014-09-17 DIAGNOSIS — D631 Anemia in chronic kidney disease: Secondary | ICD-10-CM | POA: Diagnosis not present

## 2014-09-17 DIAGNOSIS — N2581 Secondary hyperparathyroidism of renal origin: Secondary | ICD-10-CM | POA: Diagnosis not present

## 2014-09-17 DIAGNOSIS — N186 End stage renal disease: Secondary | ICD-10-CM | POA: Diagnosis not present

## 2014-09-17 NOTE — Progress Notes (Signed)
Facility  [place]    Place of Service:   OFFICE    Allergies  Allergen Reactions  . Codeine Nausea And Vomiting and Nausea Only    Chief Complaint  Patient presents with  . Hospitalization Follow-up    hospital follow-up  . Medical Management of Chronic Issues    Hypertension, DM    HPI:  Hospitalized 09/09/2014 through 09/11/1612 respiratory failure with hypoxia secondary to COPD exacerbation and volume overload. Patient has known end-stage renal disease with volume overload. Other problems encountered include hypertension, diabetes mellitus type 2, anemia of CK D, chronic thrombocytopenia, obstructive sleep apnea, and continued tobacco dependence.  Patient was treated with steroid some bronchial dilators during his hospital stay. Despite his chest pain EKG and lab did not show evidence of myocardial damage. CT of the chest was without acute features. Chest pain resolved and breathing improved on the treatments noted above. Other problems remain stable.  Patient is feeling relatively well today. He is scheduled for cholecystectomy 09/22/2014 by Dr. Marlou Starks. He is concerned whether his lungs will be able to stand the procedure. He continues to have a slight cough producing small quantities of white phlegm. Temperatures recently have been as high as 99.1.  He has had some clogging of the AV shunt of the left arm.  There has been a hematoma present in the left forearm since a fall 08/23/2014. It appears to be resolving.  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  Right shoulder pain has been present since his fall 08/23/2014. Pains shoot across the right shoulder when he turns his head to the right. He does not feel like he needs an orthopedic consultation at this time. If pains persist he would accept the referral.  Medications: Patient's Medications  New Prescriptions   No medications on file  Previous Medications   ACETAMINOPHEN (TYLENOL) 500 MG  TABLET    Take 1 tablet (500 mg total) by mouth every 6 (six) hours as needed for moderate pain.   ALBUTEROL (PROVENTIL) (2.5 MG/3ML) 0.083% NEBULIZER SOLUTION    Take 3 mLs (2.5 mg total) by nebulization every 6 (six) hours as needed for wheezing.   ALBUTEROL-IPRATROPIUM (COMBIVENT) 18-103 MCG/ACT INHALER    Inhale 2 puffs into the lungs 4 (four) times daily.   AMLODIPINE (NORVASC) 10 MG TABLET    Take 1 tablet (10 mg total) by mouth daily.   ASPIRIN EC 81 MG TABLET    Take 81 mg by mouth daily.   B COMPLEX-VITAMIN C-FOLIC ACID (NEPHRO-VITE) 0.8 MG TABS TABLET    TAKE 1 TABLET BY MOUTH EVERY DAY   CALCIUM ACETATE, PHOS BINDER, (PHOSLYRA) 667 MG/5ML SOLN    Take 2,001 mg by mouth 3 (three) times daily with meals. And 667 mg with snack   CARVEDILOL (COREG) 25 MG TABLET    Take 1 tablet (25 mg total) by mouth 2 (two) times daily. Take one tablet in the morning and one tablet in the evening   CHOLECALCIFEROL 5000 UNITS TABS    Take 5,000 Units by mouth every morning.   DIPHENHYDRAMINE (BENADRYL) 25 MG TABLET    Take 50 mg by mouth 2 (two) times daily.    DOXAZOSIN (CARDURA) 4 MG TABLET    Take 0.5 tablet  by mouth in the am and 0.5 tablet by mouth in the pm to regulate blood pressure.   DOXYCYCLINE (VIBRA-TABS) 100 MG TABLET    Take 1 tablet (100 mg total) by mouth  every 12 (twelve) hours.   GUAIFENESIN-DEXTROMETHORPHAN (ROBITUSSIN DM) 100-10 MG/5ML SYRUP    Take 10 mLs by mouth every 4 (four) hours as needed for cough.   OXYCODONE-ACETAMINOPHEN (PERCOCET/ROXICET) 5-325 MG PER TABLET    Take 1-2 tablets by mouth every 8 (eight) hours as needed for severe pain.   PRAVASTATIN (PRAVACHOL) 40 MG TABLET    Take 1 tablet (40 mg total) by mouth every evening.   PREDNISONE (DELTASONE) 10 MG TABLET    Take 40 mg by mouth daily for 3 days, then take 20 mg by mouth daily for 3 days, then take 10 mg by mouth daily for 3 days, then stop.   PROMETHAZINE (PHENERGAN) 25 MG TABLET    Take 1 tablet (25 mg total) by mouth  every 8 (eight) hours as needed for nausea or vomiting.   TRAMADOL (ULTRAM) 50 MG TABLET    One up to 4 times daly if needed for pain  Modified Medications   No medications on file  Discontinued Medications   No medications on file     Review of Systems  Constitutional: Negative for fever.       Chronically fatigued.  HENT: Positive for ear discharge and hearing loss. Negative for ear pain and tinnitus.   Eyes: Negative.   Respiratory: Negative for shortness of breath.        Dyspnea on exertion. Pneumonia. Hospitalized 04/06/14 and 06/23/2014.  Cardiovascular: Negative for chest pain, palpitations and leg swelling.  Gastrointestinal: Negative for abdominal pain and abdominal distention.       Cholelithiasis  Endocrine:       Diabetes is controlled.  Genitourinary: Negative.        Renal failure and kidney transplant.  Musculoskeletal:       Neck discomfort. Chronic right knee discomfort. Chronic back pains. Unsteady gait.  Neurological: Positive for dizziness.       Memory is worse. Feet feel numb. Recurrent headaches  Hematological:       Anemia of CKD.  Psychiatric/Behavioral: Positive for confusion and decreased concentration.       Insomnia. Falls asleep in the day.    Filed Vitals:   09/18/14 1103  BP: 150/70  Pulse: 82  Temp: 98.8 F (37.1 C)  Resp: 16  Height: 5' 7.5" (1.715 m)  Weight: 233 lb (105.688 kg)   Body mass index is 35.93 kg/(m^2).  Physical Exam  Constitutional: He is oriented to person, place, and time.  Obese.  HENT:  Loss of hearing.  Eyes:  Corrective lenses  Neck: No JVD present. No tracheal deviation present. No thyromegaly present.  Cardiovascular: Exam reveals no gallop.   No murmur heard. AF. 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  Abdominal: Soft. Bowel sounds are normal. He exhibits no distension and no mass. There is no tenderness.  Tender in the RUQ. Frequent nausea.  Musculoskeletal: Normal range of motion. He exhibits tenderness. He  exhibits no edema.  Scar left knee. Tender right knee  Lymphadenopathy:    He has no cervical adenopathy.  Neurological: He is alert and oriented to person, place, and time. No cranial nerve deficit. Coordination normal.  Positive Tinel sign bilaterally  Skin: No rash noted. No erythema. No pallor.  Psychiatric:  Drowsy during the exam.     Labs reviewed: Lab Summary Latest Ref Rng 09/14/2014 09/11/2014 09/10/2014 09/09/2014  Hemoglobin 13.0 - 17.0 g/dL 10.0(L) (None) (None) 11.1(L)  Hematocrit 39.0 - 52.0 % 29.7(L) (None) (None) 32.9(L)  White count 4.0 - 10.5 K/uL  6.4 (None) (None) 6.1  Platelet count 150 - 400 K/uL 91(L) (None) (None) 94(L)  Sodium 135 - 145 mmol/L 139 137 136 138  Potassium 3.5 - 5.1 mmol/L 4.7 4.6 4.6 3.8  Calcium 8.9 - 10.3 mg/dL 7.9(L) 8.9 8.8(L) 8.8(L)  Phosphorus - (None) (None) (None) (None)  Creatinine 0.61 - 1.24 mg/dL 11.13(H) 8.21(H) 10.76(H) 9.37(H)  AST 15 - 41 U/L (None) (None) 21 (None)  Alk Phos 38 - 126 U/L (None) (None) 64 (None)  Bilirubin 0.3 - 1.2 mg/dL (None) (None) 0.9 (None)  Glucose 65 - 99 mg/dL 200(H) 266(H) 165(H) 154(H)  Cholesterol - (None) (None) (None) (None)  HDL cholesterol - (None) (None) (None) (None)  Triglycerides - (None) (None) (None) (None)  LDL Direct - (None) (None) (None) (None)  LDL Calc - (None) (None) (None) (None)  Total protein 6.5 - 8.1 g/dL (None) (None) 7.0 (None)  Albumin 3.5 - 5.0 g/dL (None) (None) 3.7 (None)       Assessment/Plan  1. Acute respiratory failure with hypoxia Resolved  2. Calculus of gallbladder without cholecystitis without obstruction Scheduled for surgery 09/22/2014. Advised patient that I thought he would be ready for his surgery. He will continue current medications. I expect he will continue to gain strength and breathing capacity. If he runs a fever, or sputum production increases, or dyspnea increases, then he will need to cancel the surgery.  3. COPD exacerbation Resolved.  Some mild residual cough and shortness of breath with exertion.  4. Type 2 diabetes mellitus with diabetic nephropathy Controlled  5. Anemia in chronic renal disease Unchanged  6. Edema Improved  7. Essential hypertension Mild systolic blood pressure elevation. No change in medications. Continue to monitor.  8. OSA on CPAP Continue to use CPAP

## 2014-09-18 ENCOUNTER — Encounter (HOSPITAL_COMMUNITY): Payer: Self-pay | Admitting: Emergency Medicine

## 2014-09-18 ENCOUNTER — Emergency Department (HOSPITAL_COMMUNITY): Payer: Medicare Other

## 2014-09-18 ENCOUNTER — Other Ambulatory Visit: Payer: Self-pay

## 2014-09-18 ENCOUNTER — Inpatient Hospital Stay (HOSPITAL_COMMUNITY)
Admission: EM | Admit: 2014-09-18 | Discharge: 2014-09-21 | DRG: 190 | Disposition: A | Discharge status: Home / Self Care | Payer: Medicare Other | Attending: Internal Medicine | Admitting: Internal Medicine

## 2014-09-18 ENCOUNTER — Encounter: Payer: Self-pay | Admitting: Internal Medicine

## 2014-09-18 ENCOUNTER — Observation Stay (HOSPITAL_COMMUNITY): Payer: Medicare Other

## 2014-09-18 DIAGNOSIS — Z7952 Long term (current) use of systemic steroids: Secondary | ICD-10-CM

## 2014-09-18 DIAGNOSIS — E1122 Type 2 diabetes mellitus with diabetic chronic kidney disease: Secondary | ICD-10-CM | POA: Diagnosis present

## 2014-09-18 DIAGNOSIS — J9601 Acute respiratory failure with hypoxia: Secondary | ICD-10-CM | POA: Diagnosis not present

## 2014-09-18 DIAGNOSIS — D696 Thrombocytopenia, unspecified: Secondary | ICD-10-CM | POA: Diagnosis present

## 2014-09-18 DIAGNOSIS — N186 End stage renal disease: Secondary | ICD-10-CM | POA: Diagnosis present

## 2014-09-18 DIAGNOSIS — Z905 Acquired absence of kidney: Secondary | ICD-10-CM | POA: Diagnosis present

## 2014-09-18 DIAGNOSIS — Z7982 Long term (current) use of aspirin: Secondary | ICD-10-CM

## 2014-09-18 DIAGNOSIS — E8770 Fluid overload, unspecified: Secondary | ICD-10-CM | POA: Diagnosis present

## 2014-09-18 DIAGNOSIS — R05 Cough: Secondary | ICD-10-CM | POA: Diagnosis not present

## 2014-09-18 DIAGNOSIS — F17210 Nicotine dependence, cigarettes, uncomplicated: Secondary | ICD-10-CM | POA: Diagnosis not present

## 2014-09-18 DIAGNOSIS — R079 Chest pain, unspecified: Secondary | ICD-10-CM | POA: Diagnosis not present

## 2014-09-18 DIAGNOSIS — N2581 Secondary hyperparathyroidism of renal origin: Secondary | ICD-10-CM | POA: Diagnosis present

## 2014-09-18 DIAGNOSIS — E1129 Type 2 diabetes mellitus with other diabetic kidney complication: Secondary | ICD-10-CM | POA: Diagnosis present

## 2014-09-18 DIAGNOSIS — F419 Anxiety disorder, unspecified: Secondary | ICD-10-CM | POA: Diagnosis present

## 2014-09-18 DIAGNOSIS — G4733 Obstructive sleep apnea (adult) (pediatric): Secondary | ICD-10-CM | POA: Diagnosis present

## 2014-09-18 DIAGNOSIS — K769 Liver disease, unspecified: Secondary | ICD-10-CM | POA: Diagnosis present

## 2014-09-18 DIAGNOSIS — J441 Chronic obstructive pulmonary disease with (acute) exacerbation: Principal | ICD-10-CM | POA: Diagnosis present

## 2014-09-18 DIAGNOSIS — Z940 Kidney transplant status: Secondary | ICD-10-CM

## 2014-09-18 DIAGNOSIS — D631 Anemia in chronic kidney disease: Secondary | ICD-10-CM | POA: Diagnosis present

## 2014-09-18 DIAGNOSIS — R911 Solitary pulmonary nodule: Secondary | ICD-10-CM | POA: Diagnosis not present

## 2014-09-18 DIAGNOSIS — J449 Chronic obstructive pulmonary disease, unspecified: Secondary | ICD-10-CM | POA: Diagnosis not present

## 2014-09-18 DIAGNOSIS — E785 Hyperlipidemia, unspecified: Secondary | ICD-10-CM | POA: Diagnosis present

## 2014-09-18 DIAGNOSIS — E669 Obesity, unspecified: Secondary | ICD-10-CM | POA: Diagnosis present

## 2014-09-18 DIAGNOSIS — I120 Hypertensive chronic kidney disease with stage 5 chronic kidney disease or end stage renal disease: Secondary | ICD-10-CM | POA: Diagnosis present

## 2014-09-18 DIAGNOSIS — E118 Type 2 diabetes mellitus with unspecified complications: Secondary | ICD-10-CM | POA: Diagnosis not present

## 2014-09-18 DIAGNOSIS — G609 Hereditary and idiopathic neuropathy, unspecified: Secondary | ICD-10-CM | POA: Diagnosis present

## 2014-09-18 DIAGNOSIS — Z992 Dependence on renal dialysis: Secondary | ICD-10-CM

## 2014-09-18 DIAGNOSIS — I10 Essential (primary) hypertension: Secondary | ICD-10-CM | POA: Diagnosis present

## 2014-09-18 DIAGNOSIS — R0602 Shortness of breath: Secondary | ICD-10-CM

## 2014-09-18 DIAGNOSIS — E559 Vitamin D deficiency, unspecified: Secondary | ICD-10-CM | POA: Diagnosis present

## 2014-09-18 DIAGNOSIS — J80 Acute respiratory distress syndrome: Secondary | ICD-10-CM | POA: Diagnosis not present

## 2014-09-18 DIAGNOSIS — N400 Enlarged prostate without lower urinary tract symptoms: Secondary | ICD-10-CM | POA: Diagnosis present

## 2014-09-18 DIAGNOSIS — T380X5A Adverse effect of glucocorticoids and synthetic analogues, initial encounter: Secondary | ICD-10-CM | POA: Diagnosis present

## 2014-09-18 DIAGNOSIS — Z794 Long term (current) use of insulin: Secondary | ICD-10-CM

## 2014-09-18 DIAGNOSIS — E1165 Type 2 diabetes mellitus with hyperglycemia: Secondary | ICD-10-CM | POA: Diagnosis present

## 2014-09-18 DIAGNOSIS — J4530 Mild persistent asthma, uncomplicated: Secondary | ICD-10-CM | POA: Diagnosis present

## 2014-09-18 DIAGNOSIS — Z79899 Other long term (current) drug therapy: Secondary | ICD-10-CM

## 2014-09-18 LAB — BASIC METABOLIC PANEL
ANION GAP: 16 — AB (ref 5–15)
BUN: 55 mg/dL — AB (ref 6–20)
CALCIUM: 7.6 mg/dL — AB (ref 8.9–10.3)
CO2: 25 mmol/L (ref 22–32)
CREATININE: 9.32 mg/dL — AB (ref 0.61–1.24)
Chloride: 100 mmol/L — ABNORMAL LOW (ref 101–111)
GFR calc Af Amer: 7 mL/min — ABNORMAL LOW (ref >60)
GFR, EST NON AFRICAN AMERICAN: 6 mL/min — AB (ref >60)
GLUCOSE: 130 mg/dL — AB (ref 65–99)
Potassium: 3.7 mmol/L (ref 3.5–5.1)
Sodium: 141 mmol/L (ref 135–145)

## 2014-09-18 LAB — CBC
HCT: 28 % — ABNORMAL LOW (ref 39.0–52.0)
HEMOGLOBIN: 9.4 g/dL — AB (ref 13.0–17.0)
MCH: 32.3 pg (ref 26.0–34.0)
MCHC: 33.6 g/dL (ref 30.0–36.0)
MCV: 96.2 fL (ref 78.0–100.0)
Platelets: 100 10*3/uL — ABNORMAL LOW (ref 150–400)
RBC: 2.91 MIL/uL — ABNORMAL LOW (ref 4.22–5.81)
RDW: 14.1 % (ref 11.5–15.5)
WBC: 5.6 10*3/uL (ref 4.0–10.5)

## 2014-09-18 LAB — I-STAT TROPONIN, ED: TROPONIN I, POC: 0.02 ng/mL (ref 0.00–0.08)

## 2014-09-18 LAB — GLUCOSE, CAPILLARY
GLUCOSE-CAPILLARY: 243 mg/dL — AB (ref 65–99)
GLUCOSE-CAPILLARY: 306 mg/dL — AB (ref 65–99)

## 2014-09-18 LAB — BRAIN NATRIURETIC PEPTIDE: B NATRIURETIC PEPTIDE 5: 304.8 pg/mL — AB (ref 0.0–100.0)

## 2014-09-18 LAB — TROPONIN I: Troponin I: <0.03 ng/mL (ref <0.031)

## 2014-09-18 MED ORDER — HEPARIN SODIUM (PORCINE) 5000 UNIT/ML IJ SOLN
5000.0000 [IU] | Freq: Three times a day (TID) | INTRAMUSCULAR | Status: DC
  Administered 2014-09-18 – 2014-09-20 (×6): 5000 [IU] via SUBCUTANEOUS
  Filled 2014-09-18 (×6): qty 1

## 2014-09-18 MED ORDER — ALBUTEROL SULFATE (2.5 MG/3ML) 0.083% IN NEBU
5.0000 mg | INHALATION_SOLUTION | Freq: Once | RESPIRATORY_TRACT | Status: AC
  Administered 2014-09-18: 5 mg via RESPIRATORY_TRACT
  Filled 2014-09-18: qty 6

## 2014-09-18 MED ORDER — ALBUTEROL SULFATE (2.5 MG/3ML) 0.083% IN NEBU
2.5000 mg | INHALATION_SOLUTION | Freq: Four times a day (QID) | RESPIRATORY_TRACT | Status: DC
  Administered 2014-09-18 – 2014-09-20 (×9): 2.5 mg via RESPIRATORY_TRACT
  Filled 2014-09-18 (×8): qty 3

## 2014-09-18 MED ORDER — ALBUTEROL SULFATE (2.5 MG/3ML) 0.083% IN NEBU: 2.5000 mg | INHALATION_SOLUTION | RESPIRATORY_TRACT | Status: DC | PRN

## 2014-09-18 MED ORDER — ACETAMINOPHEN 325 MG PO TABS: 650.0000 mg | ORAL_TABLET | Freq: Four times a day (QID) | ORAL | Status: DC | PRN

## 2014-09-18 MED ORDER — METHYLPREDNISOLONE SODIUM SUCC 125 MG IJ SOLR
125.0000 mg | Freq: Once | INTRAMUSCULAR | Status: AC
  Administered 2014-09-18: 125 mg via INTRAVENOUS
  Filled 2014-09-18: qty 2

## 2014-09-18 MED ORDER — CETYLPYRIDINIUM CHLORIDE 0.05 % MT LIQD
7.0000 mL | Freq: Two times a day (BID) | OROMUCOSAL | Status: DC
  Administered 2014-09-19 – 2014-09-21 (×2): 7 mL via OROMUCOSAL

## 2014-09-18 MED ORDER — SODIUM CHLORIDE 0.9 % IJ SOLN
3.0000 mL | Freq: Two times a day (BID) | INTRAMUSCULAR | Status: DC
  Administered 2014-09-18 – 2014-09-20 (×5): 3 mL via INTRAVENOUS

## 2014-09-18 MED ORDER — AMLODIPINE BESYLATE 10 MG PO TABS
10.0000 mg | ORAL_TABLET | Freq: Every day | ORAL | Status: DC
  Administered 2014-09-18 – 2014-09-21 (×4): 10 mg via ORAL
  Filled 2014-09-18 (×4): qty 1

## 2014-09-18 MED ORDER — ACETAMINOPHEN 650 MG RE SUPP: 650.0000 mg | Freq: Four times a day (QID) | RECTAL | Status: DC | PRN

## 2014-09-18 MED ORDER — INSULIN ASPART 100 UNIT/ML ~~LOC~~ SOLN
0.0000 [IU] | Freq: Every day | SUBCUTANEOUS | Status: DC
  Administered 2014-09-19: 2 [IU] via SUBCUTANEOUS
  Administered 2014-09-20: 4 [IU] via SUBCUTANEOUS

## 2014-09-18 MED ORDER — IOHEXOL 350 MG/ML SOLN
100.0000 mL | Freq: Once | INTRAVENOUS | Status: AC | PRN
  Administered 2014-09-18: 100 mL via INTRAVENOUS

## 2014-09-18 MED ORDER — DIPHENHYDRAMINE HCL 25 MG PO TABS
50.0000 mg | ORAL_TABLET | Freq: Two times a day (BID) | ORAL | Status: DC
  Filled 2014-09-18: qty 2

## 2014-09-18 MED ORDER — DIPHENHYDRAMINE HCL 25 MG PO CAPS
50.0000 mg | ORAL_CAPSULE | Freq: Two times a day (BID) | ORAL | Status: DC
  Administered 2014-09-18 – 2014-09-21 (×6): 50 mg via ORAL
  Filled 2014-09-18 (×6): qty 2

## 2014-09-18 MED ORDER — DOXAZOSIN MESYLATE 2 MG PO TABS
2.0000 mg | ORAL_TABLET | Freq: Two times a day (BID) | ORAL | Status: DC
  Administered 2014-09-18 – 2014-09-21 (×6): 2 mg via ORAL
  Filled 2014-09-18 (×9): qty 1

## 2014-09-18 MED ORDER — INSULIN ASPART 100 UNIT/ML ~~LOC~~ SOLN
0.0000 [IU] | Freq: Three times a day (TID) | SUBCUTANEOUS | Status: DC
  Administered 2014-09-19: 2 [IU] via SUBCUTANEOUS
  Administered 2014-09-19: 3 [IU] via SUBCUTANEOUS
  Administered 2014-09-19: 2 [IU] via SUBCUTANEOUS
  Administered 2014-09-20: 3 [IU] via SUBCUTANEOUS
  Administered 2014-09-20: 9 [IU] via SUBCUTANEOUS
  Administered 2014-09-20: 7 [IU] via SUBCUTANEOUS
  Administered 2014-09-21 (×2): 3 [IU] via SUBCUTANEOUS

## 2014-09-18 MED ORDER — PRAVASTATIN SODIUM 40 MG PO TABS
40.0000 mg | ORAL_TABLET | Freq: Every evening | ORAL | Status: DC
  Administered 2014-09-18 – 2014-09-20 (×3): 40 mg via ORAL
  Filled 2014-09-18 (×3): qty 1

## 2014-09-18 MED ORDER — ASPIRIN EC 81 MG PO TBEC
81.0000 mg | DELAYED_RELEASE_TABLET | Freq: Every day | ORAL | Status: DC
  Administered 2014-09-19 – 2014-09-21 (×3): 81 mg via ORAL
  Filled 2014-09-18 (×3): qty 1

## 2014-09-18 MED ORDER — IPRATROPIUM BROMIDE 0.02 % IN SOLN
0.5000 mg | Freq: Once | RESPIRATORY_TRACT | Status: AC
  Administered 2014-09-18: 0.5 mg via RESPIRATORY_TRACT
  Filled 2014-09-18: qty 2.5

## 2014-09-18 MED ORDER — CARVEDILOL 25 MG PO TABS
25.0000 mg | ORAL_TABLET | Freq: Two times a day (BID) | ORAL | Status: DC
  Administered 2014-09-18 – 2014-09-21 (×6): 25 mg via ORAL
  Filled 2014-09-18 (×6): qty 1

## 2014-09-18 MED ORDER — METHYLPREDNISOLONE SODIUM SUCC 125 MG IJ SOLR
60.0000 mg | Freq: Four times a day (QID) | INTRAMUSCULAR | Status: DC
  Administered 2014-09-18 – 2014-09-20 (×6): 60 mg via INTRAVENOUS
  Filled 2014-09-18 (×5): qty 2

## 2014-09-18 NOTE — Progress Notes (Signed)
Called ED for report, spoke with Maudie Mercury. RN unavailable at this time.  ED RN to call when available.

## 2014-09-18 NOTE — Progress Notes (Signed)
RT Note:  CPAP set up at bedside on 15 cmH2o with FFM per home use. Pt states he can place himself on when ready. I told him to call for further assistance.

## 2014-09-18 NOTE — Progress Notes (Signed)
Patient arrived on unit via stretcher, wife at bedside. Telemetry placed per MD order. CMT notified.

## 2014-09-18 NOTE — ED Notes (Signed)
Rob, PA at bedside speaking with patient

## 2014-09-18 NOTE — ED Notes (Signed)
Wife stated, he was just released last Sunday and has SOB coughed for about 5 hours straight.  He has also had some chest pain all this has been going on for about 12 days.

## 2014-09-18 NOTE — H&P (Addendum)
[facility] History and Physical  AMBROS [name] [id number] DOB: 03-27-1960 DOA: 09/18/2014  Referring physician: er PCP: [name] Dooms, MD   Chief Complaint: sob  HPI: Kirk [name] is a 54 y.o. male  With PMHX of ESRD (HD M/W/F), COPD (never had PFTs), and OSA.  He was recently discharged from [facility] for a stay from 8/18-8/21.  Patient states he never felt better after being released from the hospital.  He saw his PCP on Wednesday and had pre-admission testing done this week as well where he c/o SOB and coughing- he was 94% on RA and 93% walking.   He continued to be SOB both at rest but especially with exertion.  He quit smoking last week.    He c/o right sided chest pain- worse with deep breathing.  Follows with Dr. Debara [name]- had cath (2013) and stress test in 2015  In the ER, he was found to be hypoxic and placed on O2.   CTA was ordered by ER doc and pending as his d dimer was elevated during the last admission.  X ray showed some mild edema.  Hospitalist were asked to admit.   Review of Systems:  All systems reviewed, negative unless stated above    Past Medical History  Diagnosis Date  . Anxiety   . Chronic headaches   . Dialysis care 02/06/2010  . HTN (hypertension)   . Shortness of breath   . Impotence of organic origin   . Other malaise and fatigue   . Debility, unspecified   . Cellulitis and abscess of trunk   . Kidney replaced by transplant   . Acute edema of lung, unspecified   . Unspecified constipation   . Other chronic nonalcoholic liver disease   . Localization-related (focal) (partial) epilepsy and epileptic syndromes with complex partial seizures, without mention of intractable epilepsy   . Tension headache   . Dermatophytosis of the body   . Gout, unspecified   . Pain in joint, upper arm   . Pain in joint, lower leg   . Other nonspecific abnormal serum enzyme levels   . Essential hypertension, benign   . Acute, but ill-defined, cerebrovascular  disease   . Memory loss   . Hypotension, unspecified   . Hypertrophy of prostate without urinary obstruction and other lower urinary tract symptoms (LUTS)   . Renal dialysis status(V45.11) 02/05/2010    restarted 01/02/13 ofter renal trransplant failure  . Insomnia, unspecified   . Secondary hyperparathyroidism (of renal origin)   . Lumbago   . Unspecified vitamin D deficiency   . Carpal tunnel syndrome   . Unspecified hereditary and idiopathic peripheral neuropathy   . Other and unspecified hyperlipidemia   . Unspecified essential hypertension   . Arteriovenous fistula, acquired   . Edema   . Vasectomy status   . Cholelithiasis 07/13/2014  . Sleep apnea     wears the CPAP  . Obstructive sleep apnea (adult) (pediatric)     CPAP  . COPD (chronic obstructive pulmonary disease)   . Chronic kidney disease (CKD)   . End stage renal disease   . 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)   . Pneumonia     March 2016  . Dysrhythmia     history of   Past Surgical History  Procedure Laterality Date  . Inner ear surgery  1973    for deafness at age 23  . Colonoscopy    . Dialysis shunt placement    . Kidney transplant  08/17/2011    [facility]   . Placement of fistula at [facility] vein specialist    . Left heart catheterization with coronary angiogram N/A 03/21/2011    Procedure: LEFT HEART CATHETERIZATION WITH CORONARY ANGIOGRAM;  Surgeon: [name], MD;  Location: [facility] CATH LAB;  Service: Cardiovascular;  Laterality: N/A;  . Cervical disc surgery      for spinal stenosis  . Nephrectomy      removed transplaned kidney  . Cardiac catheterization     Social History:  reports that he has been smoking Cigarettes.  He has a 14 pack-year smoking history. He has never used smokeless tobacco. He reports that he does not drink alcohol or use illicit drugs.  Allergies  Allergen Reactions  . Codeine Nausea And Vomiting and Nausea Only    Family History  Problem Relation Age of  Onset  . Adopted: Yes  . Colon cancer Neg Hx   . Esophageal cancer Neg Hx   . Rectal cancer Neg Hx   . Stomach cancer Neg Hx      Prior to Admission medications   Medication Sig Start Date End Date Taking? Authorizing Provider  acetaminophen (TYLENOL) 500 MG tablet Take 1 tablet (500 mg total) by mouth every 6 (six) hours as needed for moderate pain. 09/12/14  Yes Kirk Cota, MD  albuterol (PROVENTIL) (2.5 MG/3ML) 0.083% nebulizer solution Take 3 mLs (2.5 mg total) by nebulization every 6 (six) hours as needed for wheezing. 04/16/14  Yes Kathie Dike, MD  albuterol-ipratropium (COMBIVENT) 18-103 MCG/ACT inhaler Inhale 2 puffs into the lungs 4 (four) times daily. 09/12/14  Yes Kirk Cota, MD  amLODipine (NORVASC) 10 MG tablet Take 1 tablet (10 mg total) by mouth daily. 03/29/14  Yes [name] Dooms, MD  aspirin EC 81 MG tablet Take 81 mg by mouth daily.   Yes Historical Provider, MD  b complex-vitamin c-folic acid (NEPHRO-VITE) 0.8 MG TABS tablet TAKE 1 TABLET BY MOUTH EVERY DAY 05/25/14  Yes [name] Dooms, MD  calcium acetate, Phos Binder, (PHOSLYRA) 667 MG/5ML SOLN Take 2,001 mg by mouth 3 (three) times daily with meals. And 667 mg with snack   Yes Historical Provider, MD  carvedilol (COREG) 25 MG tablet Take 1 tablet (25 mg total) by mouth 2 (two) times daily. Take one tablet in the morning and one tablet in the evening 03/29/14  Yes [name] Dooms, MD  Cholecalciferol 5000 UNITS TABS Take 5,000 Units by mouth every morning.   Yes Historical Provider, MD  diphenhydrAMINE (BENADRYL) 25 MG tablet Take 50 mg by mouth 2 (two) times daily.    Yes Historical Provider, MD  doxazosin (CARDURA) 4 MG tablet Take 0.5 tablet  by mouth in the am and 0.5 tablet by mouth in the pm to regulate blood pressure. Patient taking differently: Take 2 mg by mouth 2 (two) times daily. Take 0.5 tablet  by mouth in the am and 0.5 tablet by mouth in the pm to regulate blood pressure. 04/01/14  Yes Kirk L Reed,  DO  doxycycline (VIBRA-TABS) 100 MG tablet Take 1 tablet (100 mg total) by mouth every 12 (twelve) hours. 09/12/14  Yes Kirk Cota, MD  guaiFENesin-dextromethorphan [facility] DM) 100-10 MG/5ML syrup Take 10 mLs by mouth every 4 (four) hours as needed for cough.   Yes Historical Provider, MD  oxyCODONE-acetaminophen (PERCOCET/ROXICET) 5-325 MG per tablet Take 1-2 tablets by mouth every 8 (eight) hours as needed for severe pain. 08/25/14  Yes Fredia Sorrow,  MD  pravastatin (PRAVACHOL) 40 MG tablet Take 1 tablet (40 mg total) by mouth every evening. 03/29/14  Yes [name] Dooms, MD  promethazine (PHENERGAN) 25 MG tablet Take 1 tablet (25 mg total) by mouth every 8 (eight) hours as needed for nausea or vomiting. 07/13/14  Yes [name] Dooms, MD  traMADol (ULTRAM) 50 MG tablet One up to 4 times daly if needed for pain Patient taking differently: Take 50 mg by mouth every 6 (six) hours as needed. One up to 4 times daly if needed for pain 06/16/14  Yes [name] Dooms, MD  predniSONE (DELTASONE) 10 MG tablet Take 40 mg by mouth daily for 3 days, then take 20 mg by mouth daily for 3 days, then take 10 mg by mouth daily for 3 days, then stop. Patient not taking: Reported on 09/15/2014 09/12/14   Kirk Cota, MD   Physical Exam: Filed Vitals:   09/18/14 1600 09/18/14 1615 09/18/14 1630 09/18/14 1700  BP: 184/81 175/83 155/72 166/67  Pulse: 85 96 87 84  Temp:      TempSrc:      Resp: 25 38 23 22  SpO2: 95% 95% 93% 95%    Wt Readings from Last 3 Encounters:  09/18/14 105.688 kg (233 lb)  09/14/14 107.276 kg (236 lb 8 oz)  09/12/14 106.55 kg (234 lb 14.4 oz)    General:  Short of breath appearing- anxious Eyes: PERRL, normal lids, irises & conjunctiva ENT: grossly normal hearing, lips & tongue Neck: no LAD, masses or thyromegaly Cardiovascular: RRR, no m/r/g. min LE edema. Telemetry: SR, no arrhythmias  Respiratory: increased work of breathing esp with exertion, wheezing and  crackles Abdomen: soft, ntnd Skin: no rash or induration seen on limited exam Musculoskeletal: grossly normal tone BUE/BLE Psychiatric: grossly normal mood and affect, speech fluent and appropriate Neurologic: grossly non-focal.          Labs on Admission:  Basic Metabolic Panel:  Recent Labs Lab 09/14/14 1609 09/18/14 1352  NA 139 141  K 4.7 3.7  CL 99* 100*  CO2 24 25  GLUCOSE 200* 130*  BUN 78* 55*  CREATININE 11.13* 9.32*  CALCIUM 7.9* 7.6*   Liver Function Tests: No results for input(s): AST, ALT, ALKPHOS, BILITOT, PROT, ALBUMIN in the last 168 hours. No results for input(s): LIPASE, AMYLASE in the last 168 hours. No results for input(s): AMMONIA in the last 168 hours. CBC:  Recent Labs Lab 09/14/14 1609 09/18/14 1352  WBC 6.4 5.6  HGB 10.0* 9.4*  HCT 29.7* 28.0*  MCV 97.4 96.2  PLT 91* 100*   Cardiac Enzymes: No results for input(s): CKTOTAL, CKMB, CKMBINDEX, TROPONINI in the last 168 hours.  BNP (last 3 results)  Recent Labs  04/06/14 0340 09/09/14 1614  BNP 264.0* 245.0*    ProBNP (last 3 results) No results for input(s): PROBNP in the last 8760 hours.  CBG:  Recent Labs Lab 09/14/14 1504  GLUCAP 158*    Radiological Exams on Admission: Dg Chest 2 View  09/18/2014   CLINICAL DATA:  Shortness of breath, cough for 2 weeks. History of COPD. Smoker.  EXAM: CHEST  2 VIEW  COMPARISON:  09/09/2014  FINDINGS: Heart is borderline in size. Mild vascular congestion. No confluent opacities or effusions. No acute bony abnormality.  IMPRESSION: Mild vascular congestion.   Electronically Signed   By: Rolm Baptise M.D.   On: 09/18/2014 14:43    EKG: Independently reviewed. NSR- rate 100  Assessment/Plan Active Problems:  HTN (hypertension)   DM (diabetes mellitus), type 2 with renal complications   History of kidney transplant   Obesity   Acute respiratory failure with hypoxia   ESRD on dialysis   SOB (shortness of breath)   Acute respiratory  distress: Likely secondary to COPD exacerbation +/- fluid overload - Telemetry - nebs scheduled and PRN - Solu-Medrol 60 mg every 6 hours - CPAP daily at bedtime - D-dimer- was elevated previously-- ER doc ordered CTA -does not wear O2 at home  CP:  Left sided - Telemetry - cycle CE -echo  ESRD/Dialysis: dry weight per patient is 104--- asked nursing for weight today dialysis Monday Wednesday Friday.  Last dialysis Friday- full - Consult nephrology in AM for dialysis-  May need before Monday  HTN: - continue coreg, norvasc, cardura  HLD - continue statin  Pre DM: last A1c 6.0 on 03/2014. Glucose nml on admission - A1c  Patient is scheduled to have GB removed on Thursday by Dr. Marlou Starks     Code Status: full DVT Prophylaxis: Family Communication: family at bedside Disposition Plan:   Time spent: 53 min  Eulogio Bear [facility] Pager [phone]

## 2014-09-18 NOTE — ED Notes (Signed)
Patient undressed, in gown, on monitor, continuous pulse oximetry, blood pressure cuff and oxygen  (2L); visitor at bedside

## 2014-09-18 NOTE — ED Provider Notes (Signed)
CSN: [id number]     Arrival date & time 09/18/14  1309 History   First MD Initiated Contact with Patient 09/18/14 1531     Chief Complaint  Patient presents with  . Shortness of Breath  . Cough  . Chest Pain     (Consider location/radiation/quality/duration/timing/severity/associated sxs/prior Treatment) HPI Comments: Patient with past medical history remarkable for COPD, chronic kidney disease, kidney transplant, on dialysis presents to the emergency department with chief complaint of shortness of breath. Patient was recently admitted for COPD exacerbation, respiratory failure, and hypoxia. He was seen in follow-up by his primary care doctor yesterday. He states that overnight his shortness of breath worsened, he began having more white sputum, and had some wheezing. He wears a CPAP at night, but does not wear any home oxygen. He has required 2 L nasal cannula to maintain greater than 90% oxygenation since being in the emergency department. He states that his symptoms are worse when he lies down. He states that he has recently had pneumonia. He reports some subjective fevers. There are no alleviating factors. Of note, patient is scheduled to have a cholecystectomy on 8/31. His last dialysis was yesterday. He completed this. He is normally on a Monday, Wednesday, Friday schedule.  The history is provided by the patient. No language interpreter was used.    Past Medical History  Diagnosis Date  . Anxiety   . Chronic headaches   . Dialysis care 02/06/2010  . HTN (hypertension)   . Shortness of breath   . Impotence of organic origin   . Other malaise and fatigue   . Debility, unspecified   . Cellulitis and abscess of trunk   . Kidney replaced by transplant   . Acute edema of lung, unspecified   . Unspecified constipation   . Other chronic nonalcoholic liver disease   . Localization-related (focal) (partial) epilepsy and epileptic syndromes with complex partial seizures, without mention of  intractable epilepsy   . Tension headache   . Dermatophytosis of the body   . Gout, unspecified   . Pain in joint, upper arm   . Pain in joint, lower leg   . Other nonspecific abnormal serum enzyme levels   . Essential hypertension, benign   . Acute, but ill-defined, cerebrovascular disease   . Memory loss   . Hypotension, unspecified   . Hypertrophy of prostate without urinary obstruction and other lower urinary tract symptoms (LUTS)   . Renal dialysis status(V45.11) 02/05/2010    restarted 01/02/13 ofter renal trransplant failure  . Insomnia, unspecified   . Secondary hyperparathyroidism (of renal origin)   . Lumbago   . Unspecified vitamin D deficiency   . Carpal tunnel syndrome   . Unspecified hereditary and idiopathic peripheral neuropathy   . Other and unspecified hyperlipidemia   . Unspecified essential hypertension   . Arteriovenous fistula, acquired   . Edema   . Vasectomy status   . Cholelithiasis 07/13/2014  . Sleep apnea     wears the CPAP  . Obstructive sleep apnea (adult) (pediatric)     CPAP  . COPD (chronic obstructive pulmonary disease)   . Chronic kidney disease (CKD)   . End stage renal disease   . 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)   . Pneumonia     March 2016  . Dysrhythmia     history of   Past Surgical History  Procedure Laterality Date  . Inner ear surgery  1973    for deafness at age 27  .  Colonoscopy    . Dialysis shunt placement    . Kidney transplant  08/17/2011    [facility]   . Placement of fistula at [facility] vein specialist    . Left heart catheterization with coronary angiogram N/A 03/21/2011    Procedure: LEFT HEART CATHETERIZATION WITH CORONARY ANGIOGRAM;  Surgeon: [name], MD;  Location: [facility] CATH LAB;  Service: Cardiovascular;  Laterality: N/A;  . Cervical disc surgery      for spinal stenosis  . Nephrectomy      removed transplaned kidney  . Cardiac catheterization     Family History  Problem Relation Age of  Onset  . Adopted: Yes  . Colon cancer Neg Hx   . Esophageal cancer Neg Hx   . Rectal cancer Neg Hx   . Stomach cancer Neg Hx    Social History  Substance Use Topics  . Smoking status: Current Every Day Smoker -- 0.50 packs/day for 28 years    Types: Cigarettes  . Smokeless tobacco: Never Used     Comment: 1/2 pack every 4 days  . Alcohol Use: No    Review of Systems  Constitutional: Positive for fever. Negative for chills.  Respiratory: Positive for cough and shortness of breath.   Cardiovascular: Negative for chest pain.  Gastrointestinal: Negative for nausea, vomiting, diarrhea and constipation.  Genitourinary: Negative for dysuria.  All other systems reviewed and are negative.     Allergies  Codeine  Home Medications   Prior to Admission medications   Medication Sig Start Date End Date Taking? Authorizing Provider  acetaminophen (TYLENOL) 500 MG tablet Take 1 tablet (500 mg total) by mouth every 6 (six) hours as needed for moderate pain. 09/12/14   Samuella Cota, MD  albuterol (PROVENTIL) (2.5 MG/3ML) 0.083% nebulizer solution Take 3 mLs (2.5 mg total) by nebulization every 6 (six) hours as needed for wheezing. 04/16/14   Kathie Dike, MD  albuterol-ipratropium (COMBIVENT) 18-103 MCG/ACT inhaler Inhale 2 puffs into the lungs 4 (four) times daily. 09/12/14   Samuella Cota, MD  amLODipine (NORVASC) 10 MG tablet Take 1 tablet (10 mg total) by mouth daily. 03/29/14   Estill Dooms, MD  aspirin EC 81 MG tablet Take 81 mg by mouth daily.    Historical Provider, MD  b complex-vitamin c-folic acid (NEPHRO-VITE) 0.8 MG TABS tablet TAKE 1 TABLET BY MOUTH EVERY DAY 05/25/14   Estill Dooms, MD  calcium acetate, Phos Binder, (PHOSLYRA) 667 MG/5ML SOLN Take 2,001 mg by mouth 3 (three) times daily with meals. And 667 mg with snack    Historical Provider, MD  carvedilol (COREG) 25 MG tablet Take 1 tablet (25 mg total) by mouth 2 (two) times daily. Take one tablet in the morning and  one tablet in the evening 03/29/14   Estill Dooms, MD  Cholecalciferol 5000 UNITS TABS Take 5,000 Units by mouth every morning.    Historical Provider, MD  diphenhydrAMINE (BENADRYL) 25 MG tablet Take 50 mg by mouth 2 (two) times daily.     Historical Provider, MD  doxazosin (CARDURA) 4 MG tablet Take 0.5 tablet  by mouth in the am and 0.5 tablet by mouth in the pm to regulate blood pressure. Patient taking differently: Take 2 mg by mouth 2 (two) times daily. Take 0.5 tablet  by mouth in the am and 0.5 tablet by mouth in the pm to regulate blood pressure. 04/01/14   Tiffany L Reed, DO  doxycycline (VIBRA-TABS) 100 MG tablet Take  1 tablet (100 mg total) by mouth every 12 (twelve) hours. 09/12/14   Samuella Cota, MD  guaiFENesin-dextromethorphan (ROBITUSSIN DM) 100-10 MG/5ML syrup Take 10 mLs by mouth every 4 (four) hours as needed for cough.    Historical Provider, MD  oxyCODONE-acetaminophen (PERCOCET/ROXICET) 5-325 MG per tablet Take 1-2 tablets by mouth every 8 (eight) hours as needed for severe pain. 08/25/14   Fredia Sorrow, MD  pravastatin (PRAVACHOL) 40 MG tablet Take 1 tablet (40 mg total) by mouth every evening. 03/29/14   Estill Dooms, MD  predniSONE (DELTASONE) 10 MG tablet Take 40 mg by mouth daily for 3 days, then take 20 mg by mouth daily for 3 days, then take 10 mg by mouth daily for 3 days, then stop. Patient not taking: Reported on 09/15/2014 09/12/14   Samuella Cota, MD  promethazine (PHENERGAN) 25 MG tablet Take 1 tablet (25 mg total) by mouth every 8 (eight) hours as needed for nausea or vomiting. 07/13/14   Estill Dooms, MD  traMADol (ULTRAM) 50 MG tablet One up to 4 times daly if needed for pain Patient taking differently: Take 50 mg by mouth every 6 (six) hours as needed. One up to 4 times daly if needed for pain 06/16/14   Estill Dooms, MD   BP 178/89 mmHg  Pulse 89  Temp(Src) 98.2 F (36.8 C) (Oral)  Resp 18  SpO2 94% Physical Exam  Constitutional: He is oriented  to person, place, and time.  Ill appearing  HENT:  Head: Normocephalic and atraumatic.  Eyes: Conjunctivae and EOM are normal. Pupils are equal, round, and reactive to light. Right eye exhibits no discharge. Left eye exhibits no discharge. No scleral icterus.  Neck: Normal range of motion. Neck supple. No JVD present.  Cardiovascular: Normal rate, regular rhythm and normal heart sounds.  Exam reveals no gallop and no friction rub.   No murmur heard. Pulmonary/Chest: He is in respiratory distress. He has wheezes. He has no rales. He exhibits no tenderness.  Scattered wheezes and crackles, increased work of breathing  Abdominal: Soft. He exhibits no distension and no mass. There is no tenderness. There is no rebound and no guarding.  No focal abdominal tenderness, no RLQ tenderness or pain at McBurney's point, no RUQ tenderness or Murphy's sign, no left-sided abdominal tenderness, no fluid wave, or signs of peritonitis   Musculoskeletal: Normal range of motion. He exhibits no edema or tenderness.  Neurological: He is alert and oriented to person, place, and time.  Skin: Skin is warm and dry.  Psychiatric: He has a normal mood and affect. His behavior is normal. Judgment and thought content normal.  Nursing note and vitals reviewed.   ED Course  Procedures (including critical care time) Labs Review Labs Reviewed  BASIC METABOLIC PANEL - Abnormal; Notable for the following:    Chloride 100 (*)    Glucose, Bld 130 (*)    BUN 55 (*)    Creatinine, Ser 9.32 (*)    Calcium 7.6 (*)    GFR calc non Af Amer 6 (*)    GFR calc Af Amer 7 (*)    Anion gap 16 (*)    All other components within normal limits  CBC - Abnormal; Notable for the following:    RBC 2.91 (*)    Hemoglobin 9.4 (*)    HCT 28.0 (*)    Platelets 100 (*)    All other components within normal limits  Randolm Idol, ED  Imaging Review Dg Chest 2 View  09/18/2014   CLINICAL DATA:  Shortness of breath, cough for 2  weeks. History of COPD. Smoker.  EXAM: CHEST  2 VIEW  COMPARISON:  09/09/2014  FINDINGS: Heart is borderline in size. Mild vascular congestion. No confluent opacities or effusions. No acute bony abnormality.  IMPRESSION: Mild vascular congestion.   Electronically Signed   By: Rolm Baptise M.D.   On: 09/18/2014 14:43   I have personally reviewed and evaluated these images and lab results as part of my medical decision-making.   EKG Interpretation   Date/Time:  Saturday September 18 2014 13:16:10 EDT Ventricular Rate:  100 PR Interval:  142 QRS Duration: 94 QT Interval:  376 QTC Calculation: 485 R Axis:   66 Text Interpretation:  Normal sinus rhythm Minimal voltage criteria for  LVH, may be normal variant Prolonged QT Abnormal ECG Confirmed by Hazle Coca [phone]) on 09/18/2014 3:20:18 PM      MDM   Final diagnoses:  SOB (shortness of breath)    Patient with SOB, increased sputum, and subjective fevers.  Discharge from hospital last week after being admitted for COPD exacerbation.  Patient has some wheezing on exam and increased work of breathing.  CXR shows mild pulmonary vascular congestion.  Patient had HD yesterday.  He has required oxygen while in the ED to maintain adequate O2 sat.  He is not on home oxygen, but does wear a CPAP at night.  Patient seen by and discussed with Dr. Ralene Bathe, who agrees that patient will require admission.  Unclear at this time whether symptoms are secondary to fluid overload, COPD exacerbation, or other process.  Of note, patient had a positive d-dimer, but felt to be low risk for PE during resent hospitalization.  Dr. Ralene Bathe recommends adding CTA PE study and admitting the patient.  Appreciate Dr. Eliseo Squires from [facility] for admitting the patient.    Montine Circle, PA-C 09/18/14 1647  Quintella Reichert, MD 09/19/14 [phone]

## 2014-09-18 NOTE — ED Notes (Signed)
Rob PA in room

## 2014-09-19 DIAGNOSIS — Z940 Kidney transplant status: Secondary | ICD-10-CM | POA: Diagnosis not present

## 2014-09-19 DIAGNOSIS — E1122 Type 2 diabetes mellitus with diabetic chronic kidney disease: Secondary | ICD-10-CM | POA: Diagnosis present

## 2014-09-19 DIAGNOSIS — K769 Liver disease, unspecified: Secondary | ICD-10-CM | POA: Diagnosis present

## 2014-09-19 DIAGNOSIS — J9601 Acute respiratory failure with hypoxia: Secondary | ICD-10-CM | POA: Diagnosis not present

## 2014-09-19 DIAGNOSIS — F419 Anxiety disorder, unspecified: Secondary | ICD-10-CM | POA: Diagnosis present

## 2014-09-19 DIAGNOSIS — Z79899 Other long term (current) drug therapy: Secondary | ICD-10-CM | POA: Diagnosis not present

## 2014-09-19 DIAGNOSIS — Z7952 Long term (current) use of systemic steroids: Secondary | ICD-10-CM | POA: Diagnosis not present

## 2014-09-19 DIAGNOSIS — N2581 Secondary hyperparathyroidism of renal origin: Secondary | ICD-10-CM | POA: Diagnosis present

## 2014-09-19 DIAGNOSIS — E1165 Type 2 diabetes mellitus with hyperglycemia: Secondary | ICD-10-CM | POA: Diagnosis present

## 2014-09-19 DIAGNOSIS — G4733 Obstructive sleep apnea (adult) (pediatric): Secondary | ICD-10-CM | POA: Diagnosis present

## 2014-09-19 DIAGNOSIS — N186 End stage renal disease: Secondary | ICD-10-CM | POA: Diagnosis not present

## 2014-09-19 DIAGNOSIS — F17210 Nicotine dependence, cigarettes, uncomplicated: Secondary | ICD-10-CM | POA: Diagnosis present

## 2014-09-19 DIAGNOSIS — Z794 Long term (current) use of insulin: Secondary | ICD-10-CM | POA: Diagnosis not present

## 2014-09-19 DIAGNOSIS — D696 Thrombocytopenia, unspecified: Secondary | ICD-10-CM | POA: Diagnosis present

## 2014-09-19 DIAGNOSIS — R0602 Shortness of breath: Secondary | ICD-10-CM | POA: Diagnosis not present

## 2014-09-19 DIAGNOSIS — Z7982 Long term (current) use of aspirin: Secondary | ICD-10-CM | POA: Diagnosis not present

## 2014-09-19 DIAGNOSIS — R739 Hyperglycemia, unspecified: Secondary | ICD-10-CM | POA: Diagnosis not present

## 2014-09-19 DIAGNOSIS — Z905 Acquired absence of kidney: Secondary | ICD-10-CM | POA: Diagnosis present

## 2014-09-19 DIAGNOSIS — T380X5A Adverse effect of glucocorticoids and synthetic analogues, initial encounter: Secondary | ICD-10-CM | POA: Diagnosis present

## 2014-09-19 DIAGNOSIS — I10 Essential (primary) hypertension: Secondary | ICD-10-CM | POA: Diagnosis not present

## 2014-09-19 DIAGNOSIS — E785 Hyperlipidemia, unspecified: Secondary | ICD-10-CM | POA: Diagnosis present

## 2014-09-19 DIAGNOSIS — I120 Hypertensive chronic kidney disease with stage 5 chronic kidney disease or end stage renal disease: Secondary | ICD-10-CM | POA: Diagnosis present

## 2014-09-19 DIAGNOSIS — J441 Chronic obstructive pulmonary disease with (acute) exacerbation: Secondary | ICD-10-CM | POA: Diagnosis not present

## 2014-09-19 DIAGNOSIS — Z992 Dependence on renal dialysis: Secondary | ICD-10-CM | POA: Diagnosis not present

## 2014-09-19 DIAGNOSIS — E8770 Fluid overload, unspecified: Secondary | ICD-10-CM | POA: Diagnosis present

## 2014-09-19 DIAGNOSIS — E118 Type 2 diabetes mellitus with unspecified complications: Secondary | ICD-10-CM | POA: Diagnosis not present

## 2014-09-19 DIAGNOSIS — E559 Vitamin D deficiency, unspecified: Secondary | ICD-10-CM | POA: Diagnosis present

## 2014-09-19 DIAGNOSIS — G609 Hereditary and idiopathic neuropathy, unspecified: Secondary | ICD-10-CM | POA: Diagnosis present

## 2014-09-19 DIAGNOSIS — N400 Enlarged prostate without lower urinary tract symptoms: Secondary | ICD-10-CM | POA: Diagnosis present

## 2014-09-19 DIAGNOSIS — R0600 Dyspnea, unspecified: Secondary | ICD-10-CM | POA: Diagnosis not present

## 2014-09-19 DIAGNOSIS — D631 Anemia in chronic kidney disease: Secondary | ICD-10-CM | POA: Diagnosis present

## 2014-09-19 LAB — CBC
HEMATOCRIT: 27.8 % — AB (ref 39.0–52.0)
HEMOGLOBIN: 9.5 g/dL — AB (ref 13.0–17.0)
MCH: 32.3 pg (ref 26.0–34.0)
MCHC: 34.2 g/dL (ref 30.0–36.0)
MCV: 94.6 fL (ref 78.0–100.0)
Platelets: 101 10*3/uL — ABNORMAL LOW (ref 150–400)
RBC: 2.94 MIL/uL — ABNORMAL LOW (ref 4.22–5.81)
RDW: 14 % (ref 11.5–15.5)
WBC: 7.3 10*3/uL (ref 4.0–10.5)

## 2014-09-19 LAB — TROPONIN I
Troponin I: <0.03 ng/mL (ref <0.031)
Troponin I: <0.03 ng/mL (ref <0.031)

## 2014-09-19 LAB — BASIC METABOLIC PANEL
Anion gap: 16 — ABNORMAL HIGH (ref 5–15)
BUN: 77 mg/dL — AB (ref 6–20)
CHLORIDE: 97 mmol/L — AB (ref 101–111)
CO2: 22 mmol/L (ref 22–32)
Calcium: 8.1 mg/dL — ABNORMAL LOW (ref 8.9–10.3)
Creatinine, Ser: 11.56 mg/dL — ABNORMAL HIGH (ref 0.61–1.24)
GFR calc Af Amer: 5 mL/min — ABNORMAL LOW (ref >60)
GFR calc non Af Amer: 4 mL/min — ABNORMAL LOW (ref >60)
GLUCOSE: 226 mg/dL — AB (ref 65–99)
POTASSIUM: 4.5 mmol/L (ref 3.5–5.1)
Sodium: 135 mmol/L (ref 135–145)

## 2014-09-19 LAB — GLUCOSE, CAPILLARY
GLUCOSE-CAPILLARY: 179 mg/dL — AB (ref 65–99)
Glucose-Capillary: 250 mg/dL — ABNORMAL HIGH (ref 65–99)
Glucose-Capillary: 195 mg/dL — ABNORMAL HIGH (ref 65–99)
Glucose-Capillary: 224 mg/dL — ABNORMAL HIGH (ref 65–99)

## 2014-09-19 MED ORDER — CALCIUM ACETATE (PHOS BINDER) 667 MG PO CAPS
2001.0000 mg | ORAL_CAPSULE | Freq: Once | ORAL | Status: AC
  Administered 2014-09-19: 2001 mg via ORAL
  Filled 2014-09-19: qty 3

## 2014-09-19 MED ORDER — RENA-VITE PO TABS
1.0000 | ORAL_TABLET | Freq: Every day | ORAL | Status: DC
  Administered 2014-09-19 – 2014-09-20 (×2): 1 via ORAL
  Filled 2014-09-19 (×2): qty 1

## 2014-09-19 MED ORDER — INSULIN ASPART PROT & ASPART (70-30 MIX) 100 UNIT/ML ~~LOC~~ SUSP
5.0000 [IU] | Freq: Two times a day (BID) | SUBCUTANEOUS | Status: DC
  Administered 2014-09-19 – 2014-09-20 (×3): 5 [IU] via SUBCUTANEOUS
  Filled 2014-09-19: qty 10

## 2014-09-19 MED ORDER — CALCITRIOL 0.25 MCG PO CAPS
0.2500 ug | ORAL_CAPSULE | ORAL | Status: DC
  Administered 2014-09-20: 0.25 ug via ORAL

## 2014-09-19 MED ORDER — CALCIUM ACETATE (PHOS BINDER) 667 MG PO CAPS
2001.0000 mg | ORAL_CAPSULE | Freq: Three times a day (TID) | ORAL | Status: DC
  Administered 2014-09-19 – 2014-09-21 (×6): 2001 mg via ORAL
  Filled 2014-09-19 (×6): qty 3

## 2014-09-19 NOTE — Consult Note (Signed)
[facility] Renal Consultation Note    Indication for Consultation:  Management of ESRD/hemodialysis; anemia, hypertension/volume and secondary hyperparathyroidism PCP: Jeanmarie Hubert, MD  HPI: Kirk [name] is a 54 y.o. male with ESRD s/p failed ddKT 12/2012 on MWF HD in [place], HTN, with a history HTN, OSA on CPAP, DM, HCAP, tobacco abuse ongoing and thrombocytopenia with  multiple admits to [facility] this year:  01/2014 diarrhea - likely viral, C dif neg 03/2014 acute respiratory failure with hypoxia, HCAP; chronic pancytopenia - BM Bx done EF 55-60% - d/c on levaquin and prednisone, nebs 06/25/2014 HCAP- rec f/u CT 6 weeks evaluate nodular opacity RUL - d/c Levaquin 09/12/2014 acute resp failure 2/2 exacerbation of COPD and volume overload EDW not changed - rec PCP refer to pul, d/c on doxy, pred taper, combivent, f/u Chest CT 1 year for nodular densities- consider further thrombocytopenia w/u  He also has calculus of GB without cholecystitis/obstruction. His PCP has referred to Gen Surgery. Bone marrow Bx 03/2014 showed hypocellular BM with erythroid  hyperplasia, no evidence of MDS - Dr. Lindi Adie is following.  He attended his usual HD treatment on Friday with a net UF of 2.2 and post weight 105.3 (EDW 105), pre BP [id number] - 99991111 systolic and post 0000000 - 99991111 sytolic.  He has been leaving slightly under EDW at times and net. UF volumes range from 1.6 - 3.9.  He presented to the ED 8/27 with cough and SOB,  sats were low and he was put on O2, CXR showed vascular congestion. He is SOB, but no more than usual. He has no N, V, D, fever or chills.  He thinks he has lost weight. His appetite is good. He believes his BS are increased due to steroid use. He stopped smoking recently and still makes urine without difficulty.   Past Medical History  Diagnosis Date  . Anxiety   . Chronic headaches   . Dialysis care 02/06/2010  . HTN (hypertension)   . Shortness of breath   . Impotence of  organic origin   . Other malaise and fatigue   . Debility, unspecified   . Cellulitis and abscess of trunk   . Kidney replaced by transplant   . Acute edema of lung, unspecified   . Unspecified constipation   . Other chronic nonalcoholic liver disease   . Localization-related (focal) (partial) epilepsy and epileptic syndromes with complex partial seizures, without mention of intractable epilepsy   . Tension headache   . Dermatophytosis of the body   . Gout, unspecified   . Pain in joint, upper arm   . Pain in joint, lower leg   . Other nonspecific abnormal serum enzyme levels   . Essential hypertension, benign   . Acute, but ill-defined, cerebrovascular disease   . Memory loss   . Hypotension, unspecified   . Hypertrophy of prostate without urinary obstruction and other lower urinary tract symptoms (LUTS)   . Renal dialysis status(V45.11) 02/05/2010    restarted 01/02/13 ofter renal trransplant failure  . Insomnia, unspecified   . Secondary hyperparathyroidism (of renal origin)   . Lumbago   . Unspecified vitamin D deficiency   . Carpal tunnel syndrome   . Unspecified hereditary and idiopathic peripheral neuropathy   . Other and unspecified hyperlipidemia   . Unspecified essential hypertension   . Arteriovenous fistula, acquired   . Edema   . Vasectomy status   . Cholelithiasis 07/13/2014  . Sleep apnea     wears the  CPAP  . Obstructive sleep apnea (adult) (pediatric)     CPAP  . COPD (chronic obstructive pulmonary disease)   . Chronic kidney disease (CKD)   . End stage renal disease   . 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)   . Pneumonia     March 2016  . Dysrhythmia     history of   Past Surgical History  Procedure Laterality Date  . Inner ear surgery  1973    for deafness at age 77  . Colonoscopy    . Dialysis shunt placement    . Kidney transplant  08/17/2011    [facility]   . Placement of fistula at [facility] vein specialist    . Left heart  catheterization with coronary angiogram N/A 03/21/2011    Procedure: LEFT HEART CATHETERIZATION WITH CORONARY ANGIOGRAM;  Surgeon: [name], MD;  Location: [facility] CATH LAB;  Service: Cardiovascular;  Laterality: N/A;  . Cervical disc surgery      for spinal stenosis  . Nephrectomy      removed transplaned kidney  . Cardiac catheterization     Family History  Problem Relation Age of Onset  . Adopted: Yes  . Colon cancer Neg Hx   . Esophageal cancer Neg Hx   . Rectal cancer Neg Hx   . Stomach cancer Neg Hx    Social History:  reports that he has been smoking Cigarettes.  He has a 14 pack-year smoking history. He has never used smokeless tobacco. He reports that he does not drink alcohol or use illicit drugs. Allergies  Allergen Reactions  . Codeine Nausea And Vomiting and Nausea Only   Prior to Admission medications   Medication Sig Start Date End Date Taking? Authorizing Provider  acetaminophen (TYLENOL) 500 MG tablet Take 1 tablet (500 mg total) by mouth every 6 (six) hours as needed for moderate pain. 09/12/14  Yes Samuella Cota, MD  albuterol (PROVENTIL) (2.5 MG/3ML) 0.083% nebulizer solution Take 3 mLs (2.5 mg total) by nebulization every 6 (six) hours as needed for wheezing. 04/16/14  Yes Kathie Dike, MD  albuterol-ipratropium (COMBIVENT) 18-103 MCG/ACT inhaler Inhale 2 puffs into the lungs 4 (four) times daily. 09/12/14  Yes Samuella Cota, MD  amLODipine (NORVASC) 10 MG tablet Take 1 tablet (10 mg total) by mouth daily. 03/29/14  Yes Estill Dooms, MD  aspirin EC 81 MG tablet Take 81 mg by mouth daily.   Yes Historical Provider, MD  b complex-vitamin c-folic acid (NEPHRO-VITE) 0.8 MG TABS tablet TAKE 1 TABLET BY MOUTH EVERY DAY 05/25/14  Yes Estill Dooms, MD  calcium acetate, Phos Binder, (PHOSLYRA) 667 MG/5ML SOLN Take 2,001 mg by mouth 3 (three) times daily with meals. And 667 mg with snack   Yes Historical Provider, MD  carvedilol (COREG) 25 MG tablet Take 1 tablet  (25 mg total) by mouth 2 (two) times daily. Take one tablet in the morning and one tablet in the evening 03/29/14  Yes Estill Dooms, MD  Cholecalciferol 5000 UNITS TABS Take 5,000 Units by mouth every morning.   Yes Historical Provider, MD  diphenhydrAMINE (BENADRYL) 25 MG tablet Take 50 mg by mouth 2 (two) times daily.    Yes Historical Provider, MD  doxazosin (CARDURA) 4 MG tablet Take 0.5 tablet  by mouth in the am and 0.5 tablet by mouth in the pm to regulate blood pressure. Patient taking differently: Take 2 mg by mouth 2 (two) times daily. Take 0.5 tablet  by mouth in  the am and 0.5 tablet by mouth in the pm to regulate blood pressure. 04/01/14  Yes Tiffany L Reed, DO  doxycycline (VIBRA-TABS) 100 MG tablet Take 1 tablet (100 mg total) by mouth every 12 (twelve) hours. 09/12/14  Yes Samuella Cota, MD  guaiFENesin-dextromethorphan [facility] DM) 100-10 MG/5ML syrup Take 10 mLs by mouth every 4 (four) hours as needed for cough.   Yes Historical Provider, MD  oxyCODONE-acetaminophen (PERCOCET/ROXICET) 5-325 MG per tablet Take 1-2 tablets by mouth every 8 (eight) hours as needed for severe pain. 08/25/14  Yes Fredia Sorrow, MD  pravastatin (PRAVACHOL) 40 MG tablet Take 1 tablet (40 mg total) by mouth every evening. 03/29/14  Yes Estill Dooms, MD  promethazine (PHENERGAN) 25 MG tablet Take 1 tablet (25 mg total) by mouth every 8 (eight) hours as needed for nausea or vomiting. 07/13/14  Yes Estill Dooms, MD  traMADol (ULTRAM) 50 MG tablet One up to 4 times daly if needed for pain Patient taking differently: Take 50 mg by mouth every 6 (six) hours as needed. One up to 4 times daly if needed for pain 06/16/14  Yes Estill Dooms, MD  predniSONE (DELTASONE) 10 MG tablet Take 40 mg by mouth daily for 3 days, then take 20 mg by mouth daily for 3 days, then take 10 mg by mouth daily for 3 days, then stop. Patient not taking: Reported on 09/15/2014 09/12/14   Samuella Cota, MD   Current  Facility-Administered Medications  Medication Dose Route Frequency Provider Last Rate Last Dose  . acetaminophen (TYLENOL) tablet 650 mg  650 mg Oral Q6H PRN Geradine Girt, DO       Or  . acetaminophen (TYLENOL) suppository 650 mg  650 mg Rectal Q6H PRN Geradine Girt, DO      . albuterol (PROVENTIL) (2.5 MG/3ML) 0.083% nebulizer solution 2.5 mg  2.5 mg Nebulization Q6H Geradine Girt, DO   2.5 mg at 09/19/14 [id number]  . albuterol (PROVENTIL) (2.5 MG/3ML) 0.083% nebulizer solution 2.5 mg  2.5 mg Nebulization Q2H PRN Geradine Girt, DO      . amLODipine (NORVASC) tablet 10 mg  10 mg Oral Daily Geradine Girt, DO   10 mg at 09/19/14 0931  . antiseptic oral rinse (CPC / CETYLPYRIDINIUM CHLORIDE 0.05%) solution 7 mL  7 mL Mouth Rinse BID Geradine Girt, DO      . aspirin EC tablet 81 mg  81 mg Oral Daily Geradine Girt, DO   81 mg at 09/19/14 0931  . carvedilol (COREG) tablet 25 mg  25 mg Oral BID Geradine Girt, DO   25 mg at 09/19/14 0931  . diphenhydrAMINE (BENADRYL) capsule 50 mg  50 mg Oral BID Geradine Girt, DO   50 mg at 09/19/14 0931  . doxazosin (CARDURA) tablet 2 mg  2 mg Oral BID Geradine Girt, DO   2 mg at 09/19/14 0931  . heparin injection 5,000 Units  5,000 Units Subcutaneous 3 times per day Geradine Girt, DO   5,000 Units at 09/19/14 [id number]  . insulin aspart (novoLOG) injection 0-5 Units  0-5 Units Subcutaneous QHS Jessica U Vann, DO      . insulin aspart (novoLOG) injection 0-9 Units  0-9 Units Subcutaneous TID WC Geradine Girt, DO   3 Units at 09/19/14 [id number]  . insulin aspart protamine- aspart (NOVOLOG MIX 70/30) injection 5 Units  5 Units Subcutaneous BID WC Reyne Dumas, MD      .  methylPREDNISolone sodium succinate (SOLU-MEDROL) 125 mg/2 mL injection 60 mg  60 mg Intravenous Q6H Jessica U Vann, DO   60 mg at 09/19/14 0724  . pravastatin (PRAVACHOL) tablet 40 mg  40 mg Oral QPM Geradine Girt, DO   40 mg at 09/18/14 2117  . sodium chloride 0.9 % injection 3 mL  3 mL Intravenous Q12H  Geradine Girt, DO   3 mL at 09/19/14 0932   Labs: Basic Metabolic Panel:  Recent Labs Lab 09/14/14 1609 09/18/14 1352 09/19/14 0844  NA 139 141 135  K 4.7 3.7 4.5  CL 99* 100* 97*  CO2 24 25 22   GLUCOSE 200* 130* 226*  BUN 78* 55* 77*  CREATININE 11.13* 9.32* 11.56*  CALCIUM 7.9* 7.6* 8.1*   CBC:  Recent Labs Lab 09/14/14 1609 09/18/14 1352 09/19/14 0844  WBC 6.4 5.6 7.3  HGB 10.0* 9.4* 9.5*  HCT 29.7* 28.0* 27.8*  MCV 97.4 96.2 94.6  PLT 91* 100* 101*   Cardiac Enzymes:  Recent Labs Lab 09/18/14 1856 09/19/14 0012 09/19/14 0844  TROPONINI <0.03 <0.03 <0.03   CBG:  Recent Labs Lab 09/14/14 1504 09/18/14 1845 09/18/14 2159 09/19/14 0734  GLUCAP 158* 243* 306* 250*  Studies/Results: Dg Chest 2 View  09/18/2014   CLINICAL DATA:  Shortness of breath, cough for 2 weeks. History of COPD. Smoker.  EXAM: CHEST  2 VIEW  COMPARISON:  09/09/2014  FINDINGS: Heart is borderline in size. Mild vascular congestion. No confluent opacities or effusions. No acute bony abnormality.  IMPRESSION: Mild vascular congestion.   Electronically Signed   By: Rolm Baptise M.D.   On: 09/18/2014 14:43   Ct Angio Chest Pe W/cm &/or Wo Cm  09/18/2014   CLINICAL DATA:  Shortness of breath and cough with chest pain 12 days. On dialysis.  EXAM: CT ANGIOGRAPHY CHEST WITH CONTRAST  TECHNIQUE: Multidetector CT imaging of the chest was performed using the standard protocol during bolus administration of intravenous contrast. Multiplanar CT image reconstructions and MIPs were obtained to evaluate the vascular anatomy.  CONTRAST:  118mL OMNIPAQUE IOHEXOL 350 MG/ML SOLN  COMPARISON:  09/10/2014 and 06/23/2014 as well as 04/12/2014  FINDINGS: Lungs are adequately inflated without consolidation or effusion. Very subtle stable nodularity over the lingula and left lower lobe with subtle stable density over the posterior right lower lobe. Likely sequelae from previous infectious/inflammatory process as seen  on CT 04/12/2014. Airways are within normal.  Heart is normal in size. Pulmonary arterial system is within normal without evidence of emboli. There is mild calcified plaque over the thoracic aorta. There is no evidence of mediastinal, hilar or axillary adenopathy. Remaining mediastinal structures are within normal.  Images through the upper abdomen are within normal. Remaining bones and soft tissues are within normal.  Review of the MIP images confirms the above findings.  IMPRESSION: No evidence of pulmonary embolism.  Subtle stable bilateral nodularity as described likely sequelae from previous infectious/inflammatory insult. Recommend followup noncontrast chest CT 1 year.   Electronically Signed   By: Marin Olp M.D.   On: 09/18/2014 17:55    ROS: As per HPI otherwise negative.  Physical Exam: Filed Vitals:   09/18/14 2213 09/19/14 0401 09/19/14 0735 09/19/14 0834  BP: 184/73 164/84 178/83   Pulse: 88 92 90   Temp: 98.1 F (36.7 C) 98.2 F (36.8 C) 98.3 F (36.8 C)   TempSrc:   Oral   Resp: 22 22 20    Weight: 107.049 kg (236 lb)  SpO2: 97% 100% 97% 96%     General: Well developed, well nourished, in no acute distress wearing O2 Head: Normocephalic, atraumatic, sclera non-icteric, mucus membranes are moist Neck: Supple. JVD not elevated. Lungs: bilateral crackles Breathing is unlabored. Heart: RRR with S1 S2. No murmurs, rubs, or gallops appreciated. Abdomen: Soft, non-tender, non-distended with normoactive bowel sounds.  M-S:  Strength and tone appear normal for age. Lower extremities: 1+ LE  edema or ischemic changes, no open wounds , feet puffy Neuro: Alert and oriented X 3. Moves all extremities spontaneously. Psych:  Responds to questions appropriately with a normal affect. Dialysis Access: left lower AVF + bruit  Dialysis Orders: [facility] MWF 4 hr 180 EDW 105 BFR 375 DFR 800 2 K 2.25 Ca left VF heparin 4000 Mircera 100 q 2 weeks - last 8/24, calcitriol 0.25 Recent labs;   Hgb 10.5 8/17 63% sat iPTH 365 Ca/P ok  Assessment/Plan: 1. SOB/cough hx COPD- poss vol excess as cause; I suspect he has lost weight and needs EDW lowered based on exam, ^ BP; he thinks he can wait for HD until tomorrow. Repeat Echo pending.  2. ESRD -  MWF - HD first thing in the in the am  k 4.5 CXR w/o sig edema and breathing improved with IV steroids/ nebs. Prob has extra volume which we will attempt to remove w HD tomorrow 3. Hypertension/volume  - decrease volume on HD, may need serial HD for UF on Tuesday if problems getting volume off. 4. Anemia  - Hgb 9.5 - down 1 gm from outpt Hgb; if Hgb drops further can redose ESA on Wednesday - otherwise due 9/7; Had been off [place] since mid July - just resumed [id number]. 5. Metabolic bone disease -  Continue low dose calcitriol/binders - takes 3-4 phoslo ac - no fosrenol as binder 6. Nutrition - renal carb mod diet; 7. DM -sugars up likely from steroids; his perception is that he doesn't really have DM - that his sugars are up just from the prednisone. Monthly blood sugars have not been followed at HD unit nor have Hgb A1C - need to order after d/c 8. Hx tobacco abuse - quit very recently 84. Thrombocytopenia - chronic - follow - generally 90 - 120 at outpt HD unit - followed monthly  Myriam Jacobson, PA-C [place] [phone] 09/19/2014, 11:30 AM   Pt seen, examined and agree w A/P as above.  Kelly Splinter MD pager [phone]    cell [phone] 09/19/2014, 6:13 PM

## 2014-09-19 NOTE — Progress Notes (Signed)
Pt has CPAP set up at bedside and wants to try a nasal mask. Pt states he can place himself on when ready. I told him to call for further assistance.

## 2014-09-19 NOTE — Progress Notes (Addendum)
[facility] PROGRESS NOTE  Kirk [name] [id number] DOB: [birth year] DOA: 09/18/2014 PCP: Estill Dooms, MD  Assessment/Plan: Active Problems:   HTN (hypertension)   DM (diabetes mellitus), type 2 with renal complications   History of kidney transplant   Obesity   Acute respiratory failure with hypoxia   ESRD on dialysis   SOB (shortness of breath)    Acute respiratory distress: Likely secondary to COPD exacerbation +/- fluid overload - Telemetry - nebs scheduled and PRN - Continue Solu-Medrol 60 mg every 6 hours - CPAP daily at bedtime  CTA negative for pulmonary embolism, showed nodularity over the lingula and left lower lobe with subtle stable density over the posterior right lower lobe, patient would need a repeat CT scan in one year to be arranged for by the PCP Likely volume overload needs hemodialysis   CP: Left sided-atypical - Telemetry, cardiac enzymes negative, 2-D echo pending   ESRD/Dialysis: dry weight per patient is 104---  dialysis Monday Wednesday Friday.  Last dialysis Friday- full Nephrology consulted   HTN: - continue coreg, norvasc, cardura  HLD - continue statin  Diabetes2 Hemoglobin A1c 6.7. Glucose nml on admission CBG uncontrolled because of steroids Start patient on NPH 5 units twice a day   Patient is scheduled to have GB removed on Thursday by Dr. Marlou Starks     Code Status:      Code Status Orders        Start     Ordered   09/18/14 1827  Full code   Continuous     09/18/14 1826     Family Communication: family updated about patient's clinical progress Disposition Plan:  As above nephrology to see the patient   Brief narrative: *Kirk [name] is a 54 y.o. male  With [place] of ESRD (HD M/W/F), COPD (never had PFTs), and OSA. He was recently discharged from [facility] for a stay from 8/18-8/21. Patient states he never felt better after being released from the hospital. He saw his PCP on Wednesday and had  pre-admission testing done this week as well where he c/o SOB and coughing- he was 94% on RA and 93% walking. He continued to be SOB both at rest but especially with exertion. He quit smoking last week.   He c/o right sided chest pain- worse with deep breathing. Follows with Dr. Debara Pickett- had cath (2013) and stress test in 2015  In the ER, he was found to be hypoxic and placed on O2. CTA was ordered by ER doc and pending as his d dimer was elevated during the last admission. X ray showed some mild edema. Hospitalist were asked to admit.    Consultants:  Nephrology  Procedures:  None  Antibiotics: Anti-infectives    None         HPI/Subjective: Patient hemodynamically stable overnight hypertensive this morning  Objective: Filed Vitals:   09/18/14 2213 09/19/14 0401 09/19/14 0735 09/19/14 0834  BP: 184/73 164/84 178/83   Pulse: 88 92 90   Temp: 98.1 F (36.7 C) 98.2 F (36.8 C) 98.3 F (36.8 C)   TempSrc:   Oral   Resp: 22 22 20    Weight: 107.049 kg (236 lb)     SpO2: 97% 100% 97% 96%    Intake/Output Summary (Last 24 hours) at 09/19/14 1109 Last data filed at 09/19/14 0600  Gross per 24 hour  Intake    720 ml  Output    250 ml  Net  470 ml    Exam:  General: No acute respiratory distress Lungs: Clear to auscultation bilaterally without wheezes or crackles Cardiovascular: Regular rate and rhythm without murmur gallop or rub normal S1 and S2 Abdomen: Nontender, nondistended, soft, bowel sounds positive, no rebound, no ascites, no appreciable mass Extremities: No significant cyanosis, clubbing, or edema bilateral lower extremities     Data Review   Micro Results Recent Results (from the past 240 hour(s))  MRSA PCR Screening     Status: None   Collection Time: 09/10/14  3:50 PM  Result Value Ref Range Status   MRSA by PCR NEGATIVE NEGATIVE Final    Comment:        The GeneXpert MRSA Assay (FDA approved for NASAL specimens only), is one  component of a comprehensive MRSA colonization surveillance program. It is not intended to diagnose MRSA infection nor to guide or monitor treatment for MRSA infections.     Radiology Reports Dg Chest 2 View  09/18/2014   CLINICAL DATA:  Shortness of breath, cough for 2 weeks. History of COPD. Smoker.  EXAM: CHEST  2 VIEW  COMPARISON:  09/09/2014  FINDINGS: Heart is borderline in size. Mild vascular congestion. No confluent opacities or effusions. No acute bony abnormality.  IMPRESSION: Mild vascular congestion.   Electronically Signed   By: Rolm Baptise M.D.   On: 09/18/2014 14:43   Dg Chest 2 View  09/09/2014   CLINICAL DATA:  Shortness of breath today. Productive cough. Dialysis yesterday.  EXAM: CHEST  2 VIEW  COMPARISON:  08/25/2014.  FINDINGS: Normal sized heart. Clear lungs. Central peribronchial thickening. Cervical spine fixation hardware.  IMPRESSION: No acute abnormality. Stable mild to moderate chronic bronchitic changes.   Electronically Signed   By: Claudie Revering M.D.   On: 09/09/2014 16:52   Dg Chest 2 View  08/25/2014   CLINICAL DATA:  Fall earlier today. Kidney transplant patient on dialysis. Initial encounter.  EXAM: CHEST  2 VIEW  COMPARISON:  07/02/2014  FINDINGS: The heart size and mediastinal contours are within normal limits. Both lungs are clear. Previous nodular opacity in the right midlung on prior exam is no longer visualized. No evidence of pneumothorax or pleural effusion. Cervical spine fusion hardware again noted.  IMPRESSION: No active cardiopulmonary disease.   Electronically Signed   By: Earle Gell M.D.   On: 08/25/2014 13:31   Dg Lumbar Spine Complete  08/25/2014   CLINICAL DATA:  Fall earlier today. Low back pain. Initial encounter.  EXAM: LUMBAR SPINE - COMPLETE 4+ VIEW  COMPARISON:  None.  FINDINGS: There is no evidence of lumbar spine fracture. Alignment is normal. Mild degenerative disc disease is seen at L3-4. Other intervertebral disc spaces are  maintained. No other significant bone abnormality identified.  IMPRESSION: No acute findings.  Mild L3-4 degenerative disc disease.   Electronically Signed   By: Earle Gell M.D.   On: 08/25/2014 13:29   Dg Shoulder Right  08/25/2014   CLINICAL DATA:  Pain following fall  EXAM: RIGHT SHOULDER - 2+ VIEW  COMPARISON:  None.  FINDINGS: Oblique, Y scapular, and axillary images obtained. There is no fracture or dislocation. There is osteoarthritic change in the acromioclavicular joint. The glenohumeral joint appears normal. No erosive change or intra-articular calcification.  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 No fracture or dislocation.   Electronically Signed   By: Lowella Grip III M.D.   On: 08/25/2014 13:34   Dg Forearm Left  08/25/2014   CLINICAL DATA:  Pain following fall  EXAM: LEFT FOREARM - 2 VIEW  COMPARISON:  None.  FINDINGS: Frontal and lateral views were obtained. There are surgical clips volar to the distal radius. There are apparent soft tissue defects lateral and volar to the radius. No fracture or dislocation. Joint spaces appear intact. No erosive change.  IMPRESSION: Lucencies in the soft tissues volar and lateral to the radius. Question traumatic etiology. Surgical clips volar to the distal radius. No acute fracture or dislocation. No appreciable joint space narrowing or erosion.   Electronically Signed   By: Lowella Grip III M.D.   On: 08/25/2014 13:30   Dg Forearm Right  08/25/2014   CLINICAL DATA:  Mid forearm pain.  Fall today.  Initial encounter.  EXAM: RIGHT FOREARM - 2 VIEW  COMPARISON:  None.  FINDINGS: There is soft tissue swelling over the dorsal aspect of the RIGHT forearm. There is no fracture. No radiopaque foreign body. Radius and ulna appear intact.  IMPRESSION: Soft tissue swelling over the dorsal RIGHT forearm.   Electronically Signed   By: Dereck Ligas M.D.   On: 08/25/2014 13:33   Ct Head Wo Contrast  08/25/2014   CLINICAL DATA:   Fall today. RIGHT-sided neck and shoulder pain. Headache. No loss of consciousness.  EXAM: CT HEAD WITHOUT CONTRAST  CT CERVICAL SPINE WITHOUT CONTRAST  TECHNIQUE: Multidetector CT imaging of the head and cervical spine was performed following the standard protocol without intravenous contrast. Multiplanar CT image reconstructions of the cervical spine were also generated.  COMPARISON:  MRI 06/22/2010.  CT 06/22/2010.  FINDINGS: CT HEAD FINDINGS  No mass lesion, mass effect, midline shift, hydrocephalus, hemorrhage. No territorial ischemia or acute infarction. The calvarium is intact. The paranasal sinuses appear normal. Intracranial atherosclerosis is present.  CT CERVICAL SPINE FINDINGS  Alignment: Straightening of the normal cervical lordosis. Between 1 mm and 2 mm anterolisthesis of C3 on C4 and C4 on C5. C5-C6 ACDF.  Craniocervical junction: Atlantodental degenerative disease. Craniocervical alignment appears normal. Odontoid intact. C1 ring intact.  Vertebrae: No aggressive osseous lesions.  Negative for fracture.  Paraspinal soft tissues: Within normal limits. Mild carotid atherosclerosis.  Lung apices: Normal.  Ankylosis of the LEFT C2-C3 facet joints with solid bridging bone on the LEFT and a small amount of bridging bone on the RIGHT.  C5-C6 ACDF. Probable small amount of bridging bone anteriorly. No findings to suggest pseudoarthrosis. Multilevel cervical degenerative disc and facet disease. Severe LEFT-sided C3-C4 facet disease is present compatible with adjacent segment disease from the superior ankylosis.  IMPRESSION: 1. Negative CT head. 2. Uncomplicated [id number] ACDF. 3. No acute cervical spine fracture or dislocation. Mild spondylolisthesis in the upper cervical spine appears degenerative.   Electronically Signed   By: Dereck Ligas M.D.   On: 08/25/2014 13:42   Ct Chest Wo Contrast  09/10/2014   CLINICAL DATA:  COPD exacerbation, tobacco use  EXAM: CT CHEST WITHOUT CONTRAST  TECHNIQUE:  Multidetector CT imaging of the chest was performed following the standard protocol without IV contrast.  COMPARISON:  09/09/2014, 06/23/2014  FINDINGS: Lungs are well aerated bilaterally. Minimal patchy infiltrate/atelectasis  The visualized upper abdomen shows some nonobstructing renal calculi as well as cholelithiasis. No acute bony abnormality is seen. Is noted in the lower lobes bilaterally slightly worse on the left than the right. A few small less than 4 mm nodules are noted. Most prominent these is noted on image number 28 of series 3. The previously seen nodular density in the right upper  lobe is no longer identified.  The thoracic inlet is within normal limits. Aortic calcifications are noted without aneurysmal dilatation. Mild coronary calcifications are seen. No hilar or mediastinal adenopathy is noted  The upper abdomen reveals evidence of cholelithiasis and nonobstructing renal calculi. No acute bony abnormality is noted.  IMPRESSION: Bibasilar atelectasis/ infiltrate this is increased slightly in the interval from the prior CT.  Stable nodular densities as described. If the patient is at high risk for bronchogenic carcinoma, follow-up chest CT at 1 year is recommended. If the patient is at low risk, no follow-up is needed. This recommendation follows the consensus statement: Guidelines for Management of Small Pulmonary Nodules Detected on CT Scans: A Statement from the [place] as published in Radiology 2005; 237:395-400.  Resolution of previously seen right upper lobe nodule consistent with prior inflammatory change.  Cholelithiasis and nonobstructing renal calculi.   Electronically Signed   By: Inez Catalina M.D.   On: 09/10/2014 11:54   Ct Angio Chest Pe W/cm &/or Wo Cm  09/18/2014   CLINICAL DATA:  Shortness of breath and cough with chest pain 12 days. On dialysis.  EXAM: CT ANGIOGRAPHY CHEST WITH CONTRAST  TECHNIQUE: Multidetector CT imaging of the chest was performed using the  standard protocol during bolus administration of intravenous contrast. Multiplanar CT image reconstructions and MIPs were obtained to evaluate the vascular anatomy.  CONTRAST:  137mL OMNIPAQUE IOHEXOL 350 MG/ML SOLN  COMPARISON:  09/10/2014 and 06/23/2014 as well as 04/12/2014  FINDINGS: Lungs are adequately inflated without consolidation or effusion. Very subtle stable nodularity over the lingula and left lower lobe with subtle stable density over the posterior right lower lobe. Likely sequelae from previous infectious/inflammatory process as seen on CT 04/12/2014. Airways are within normal.  Heart is normal in size. Pulmonary arterial system is within normal without evidence of emboli. There is mild calcified plaque over the thoracic aorta. There is no evidence of mediastinal, hilar or axillary adenopathy. Remaining mediastinal structures are within normal.  Images through the upper abdomen are within normal. Remaining bones and soft tissues are within normal.  Review of the MIP images confirms the above findings.  IMPRESSION: No evidence of pulmonary embolism.  Subtle stable bilateral nodularity as described likely sequelae from previous infectious/inflammatory insult. Recommend followup noncontrast chest CT 1 year.   Electronically Signed   By: Marin Olp M.D.   On: 09/18/2014 17:55   Ct Cervical Spine Wo Contrast  08/25/2014   CLINICAL DATA:  Fall today. RIGHT-sided neck and shoulder pain. Headache. No loss of consciousness.  EXAM: CT HEAD WITHOUT CONTRAST  CT CERVICAL SPINE WITHOUT CONTRAST  TECHNIQUE: Multidetector CT imaging of the head and cervical spine was performed following the standard protocol without intravenous contrast. Multiplanar CT image reconstructions of the cervical spine were also generated.  COMPARISON:  MRI 06/22/2010.  CT 06/22/2010.  FINDINGS: CT HEAD FINDINGS  No mass lesion, mass effect, midline shift, hydrocephalus, hemorrhage. No territorial ischemia or acute infarction. The  calvarium is intact. The paranasal sinuses appear normal. Intracranial atherosclerosis is present.  CT CERVICAL SPINE FINDINGS  Alignment: Straightening of the normal cervical lordosis. Between 1 mm and 2 mm anterolisthesis of C3 on C4 and C4 on C5. C5-C6 ACDF.  Craniocervical junction: Atlantodental degenerative disease. Craniocervical alignment appears normal. Odontoid intact. C1 ring intact.  Vertebrae: No aggressive osseous lesions.  Negative for fracture.  Paraspinal soft tissues: Within normal limits. Mild carotid atherosclerosis.  Lung apices: Normal.  Ankylosis of the LEFT C2-C3 facet  joints with solid bridging bone on the LEFT and a small amount of bridging bone on the RIGHT.  C5-C6 ACDF. Probable small amount of bridging bone anteriorly. No findings to suggest pseudoarthrosis. Multilevel cervical degenerative disc and facet disease. Severe LEFT-sided C3-C4 facet disease is present compatible with adjacent segment disease from the superior ankylosis.  IMPRESSION: 1. Negative CT head. 2. Uncomplicated [id number] ACDF. 3. No acute cervical spine fracture or dislocation. Mild spondylolisthesis in the upper cervical spine appears degenerative.   Electronically Signed   By: Dereck Ligas M.D.   On: 08/25/2014 13:42     CBC  Recent Labs Lab 09/14/14 1609 09/18/14 1352 09/19/14 0844  WBC 6.4 5.6 7.3  HGB 10.0* 9.4* 9.5*  HCT 29.7* 28.0* 27.8*  PLT 91* 100* 101*  MCV 97.4 96.2 94.6  MCH 32.8 32.3 32.3  MCHC 33.7 33.6 34.2  RDW 14.1 14.1 14.0    Chemistries   Recent Labs Lab 09/14/14 1609 09/18/14 1352 09/19/14 0844  NA 139 141 135  K 4.7 3.7 4.5  CL 99* 100* 97*  CO2 24 25 22   GLUCOSE 200* 130* 226*  BUN 78* 55* 77*  CREATININE 11.13* 9.32* 11.56*  CALCIUM 7.9* 7.6* 8.1*   ------------------------------------------------------------------------------------------------------------------ estimated creatinine clearance is 8.8 mL/min (by C-G formula based on Cr of  11.56). ------------------------------------------------------------------------------------------------------------------ No results for input(s): HGBA1C in the last 72 hours. ------------------------------------------------------------------------------------------------------------------ No results for input(s): CHOL, HDL, LDLCALC, TRIG, CHOLHDL, LDLDIRECT in the last 72 hours. ------------------------------------------------------------------------------------------------------------------ No results for input(s): TSH, T4TOTAL, T3FREE, THYROIDAB in the last 72 hours.  Invalid input(s): FREET3 ------------------------------------------------------------------------------------------------------------------ No results for input(s): VITAMINB12, FOLATE, FERRITIN, TIBC, IRON, RETICCTPCT in the last 72 hours.  Coagulation profile No results for input(s): INR, PROTIME in the last 168 hours.  No results for input(s): DDIMER in the last 72 hours.  Cardiac Enzymes  Recent Labs Lab 09/18/14 1856 09/19/14 0012 09/19/14 0844  TROPONINI <0.03 <0.03 <0.03   ------------------------------------------------------------------------------------------------------------------ Invalid input(s): POCBNP   CBG:  Recent Labs Lab 09/14/14 1504 09/18/14 1845 09/18/14 2159 09/19/14 0734  GLUCAP 158* 243* 306* 250*       Studies: Dg Chest 2 View  09/18/2014   CLINICAL DATA:  Shortness of breath, cough for 2 weeks. History of COPD. Smoker.  EXAM: CHEST  2 VIEW  COMPARISON:  09/09/2014  FINDINGS: Heart is borderline in size. Mild vascular congestion. No confluent opacities or effusions. No acute bony abnormality.  IMPRESSION: Mild vascular congestion.   Electronically Signed   By: Rolm Baptise M.D.   On: 09/18/2014 14:43   Ct Angio Chest Pe W/cm &/or Wo Cm  09/18/2014   CLINICAL DATA:  Shortness of breath and cough with chest pain 12 days. On dialysis.  EXAM: CT ANGIOGRAPHY CHEST WITH CONTRAST   TECHNIQUE: Multidetector CT imaging of the chest was performed using the standard protocol during bolus administration of intravenous contrast. Multiplanar CT image reconstructions and MIPs were obtained to evaluate the vascular anatomy.  CONTRAST:  185mL OMNIPAQUE IOHEXOL 350 MG/ML SOLN  COMPARISON:  09/10/2014 and 06/23/2014 as well as 04/12/2014  FINDINGS: Lungs are adequately inflated without consolidation or effusion. Very subtle stable nodularity over the lingula and left lower lobe with subtle stable density over the posterior right lower lobe. Likely sequelae from previous infectious/inflammatory process as seen on CT 04/12/2014. Airways are within normal.  Heart is normal in size. Pulmonary arterial system is within normal without evidence of emboli. There is mild calcified plaque over the thoracic aorta. There is no  evidence of mediastinal, hilar or axillary adenopathy. Remaining mediastinal structures are within normal.  Images through the upper abdomen are within normal. Remaining bones and soft tissues are within normal.  Review of the MIP images confirms the above findings.  IMPRESSION: No evidence of pulmonary embolism.  Subtle stable bilateral nodularity as described likely sequelae from previous infectious/inflammatory insult. Recommend followup noncontrast chest CT 1 year.   Electronically Signed   By: Marin Olp M.D.   On: 09/18/2014 17:55      Lab Results  Component Value Date   HGBA1C 6.7* 09/14/2014   HGBA1C 6.0* 04/08/2014   HGBA1C 5.7* 11/26/2013   Lab Results  Component Value Date   LDLCALC 52 08/27/2013   CREATININE 11.56* 09/19/2014       Scheduled Meds: . albuterol  2.5 mg Nebulization Q6H  . amLODipine  10 mg Oral Daily  . antiseptic oral rinse  7 mL Mouth Rinse BID  . aspirin EC  81 mg Oral Daily  . carvedilol  25 mg Oral BID  . diphenhydrAMINE  50 mg Oral BID  . doxazosin  2 mg Oral BID  . heparin  5,000 Units Subcutaneous 3 times per day  . insulin  aspart  0-5 Units Subcutaneous QHS  . insulin aspart  0-9 Units Subcutaneous TID WC  . methylPREDNISolone (SOLU-MEDROL) injection  60 mg Intravenous Q6H  . pravastatin  40 mg Oral QPM  . sodium chloride  3 mL Intravenous Q12H   Continuous Infusions:   Active Problems:   HTN (hypertension)   DM (diabetes mellitus), type 2 with renal complications   History of kidney transplant   Obesity   Acute respiratory failure with hypoxia   ESRD on dialysis   SOB (shortness of breath)    Time spent: 45 minutes   [facility] Pager [phone]. If 7PM-7AM, please contact night-coverage at [email or web address], password [facility] 09/19/2014, 11:09 AM

## 2014-09-20 ENCOUNTER — Inpatient Hospital Stay (HOSPITAL_COMMUNITY): Payer: Medicare Other

## 2014-09-20 DIAGNOSIS — I10 Essential (primary) hypertension: Secondary | ICD-10-CM

## 2014-09-20 DIAGNOSIS — Z992 Dependence on renal dialysis: Secondary | ICD-10-CM

## 2014-09-20 DIAGNOSIS — R739 Hyperglycemia, unspecified: Secondary | ICD-10-CM

## 2014-09-20 DIAGNOSIS — N186 End stage renal disease: Secondary | ICD-10-CM

## 2014-09-20 DIAGNOSIS — R0600 Dyspnea, unspecified: Secondary | ICD-10-CM

## 2014-09-20 DIAGNOSIS — J441 Chronic obstructive pulmonary disease with (acute) exacerbation: Principal | ICD-10-CM

## 2014-09-20 LAB — COMPREHENSIVE METABOLIC PANEL
ALT: 29 U/L (ref 17–63)
AST: 24 U/L (ref 15–41)
Albumin: 3.3 g/dL — ABNORMAL LOW (ref 3.5–5.0)
Alkaline Phosphatase: 54 U/L (ref 38–126)
Anion gap: 17 — ABNORMAL HIGH (ref 5–15)
BILIRUBIN TOTAL: 0.7 mg/dL (ref 0.3–1.2)
BUN: 103 mg/dL — AB (ref 6–20)
CO2: 21 mmol/L — ABNORMAL LOW (ref 22–32)
CREATININE: 14.08 mg/dL — AB (ref 0.61–1.24)
Calcium: 8.2 mg/dL — ABNORMAL LOW (ref 8.9–10.3)
Chloride: 95 mmol/L — ABNORMAL LOW (ref 101–111)
GFR, EST AFRICAN AMERICAN: 4 mL/min — AB (ref >60)
GFR, EST NON AFRICAN AMERICAN: 3 mL/min — AB (ref >60)
Glucose, Bld: 240 mg/dL — ABNORMAL HIGH (ref 65–99)
POTASSIUM: 5.4 mmol/L — AB (ref 3.5–5.1)
Sodium: 133 mmol/L — ABNORMAL LOW (ref 135–145)
TOTAL PROTEIN: 6.2 g/dL — AB (ref 6.5–8.1)

## 2014-09-20 LAB — GLUCOSE, CAPILLARY
GLUCOSE-CAPILLARY: 367 mg/dL — AB (ref 65–99)
GLUCOSE-CAPILLARY: 343 mg/dL — AB (ref 65–99)
GLUCOSE-CAPILLARY: 312 mg/dL — AB (ref 65–99)
Glucose-Capillary: 217 mg/dL — ABNORMAL HIGH (ref 65–99)

## 2014-09-20 LAB — HEPATITIS B SURFACE ANTIGEN: Hepatitis B Surface Ag: NEGATIVE

## 2014-09-20 MED ORDER — ALBUTEROL SULFATE (2.5 MG/3ML) 0.083% IN NEBU
2.5000 mg | INHALATION_SOLUTION | Freq: Four times a day (QID) | RESPIRATORY_TRACT | Status: DC
  Administered 2014-09-21 (×2): 2.5 mg via RESPIRATORY_TRACT
  Filled 2014-09-20 (×2): qty 3

## 2014-09-20 MED ORDER — HEPARIN SODIUM (PORCINE) 1000 UNIT/ML DIALYSIS: 1000.0000 [IU] | INTRAMUSCULAR | Status: DC | PRN

## 2014-09-20 MED ORDER — SODIUM CHLORIDE 0.9 % IV SOLN: 100.0000 mL | INTRAVENOUS | Status: DC | PRN

## 2014-09-20 MED ORDER — PENTAFLUOROPROP-TETRAFLUOROETH EX AERO: 1.0000 "application " | INHALATION_SPRAY | CUTANEOUS | Status: DC | PRN

## 2014-09-20 MED ORDER — LIDOCAINE HCL (PF) 1 % IJ SOLN: 5.0000 mL | INTRAMUSCULAR | Status: DC | PRN

## 2014-09-20 MED ORDER — NEPRO/CARBSTEADY PO LIQD: 237.0000 mL | ORAL | Status: DC | PRN

## 2014-09-20 MED ORDER — HEPARIN SODIUM (PORCINE) 1000 UNIT/ML DIALYSIS: 20.0000 [IU]/kg | INTRAMUSCULAR | Status: DC | PRN

## 2014-09-20 MED ORDER — INSULIN ASPART PROT & ASPART (70-30 MIX) 100 UNIT/ML ~~LOC~~ SUSP
10.0000 [IU] | Freq: Two times a day (BID) | SUBCUTANEOUS | Status: DC
  Administered 2014-09-20 – 2014-09-21 (×2): 10 [IU] via SUBCUTANEOUS
  Filled 2014-09-20: qty 10

## 2014-09-20 MED ORDER — ALTEPLASE 2 MG IJ SOLR
2.0000 mg | Freq: Once | INTRAMUSCULAR | Status: DC | PRN
  Filled 2014-09-20: qty 2

## 2014-09-20 MED ORDER — LIDOCAINE-PRILOCAINE 2.5-2.5 % EX CREA
1.0000 "application " | TOPICAL_CREAM | CUTANEOUS | Status: DC | PRN
  Filled 2014-09-20: qty 5

## 2014-09-20 MED ORDER — BUDESONIDE-FORMOTEROL FUMARATE 80-4.5 MCG/ACT IN AERO
2.0000 | INHALATION_SPRAY | Freq: Two times a day (BID) | RESPIRATORY_TRACT | Status: DC
  Administered 2014-09-21: 2 via RESPIRATORY_TRACT
  Filled 2014-09-20 (×3): qty 6.9

## 2014-09-20 MED ORDER — ALBUTEROL SULFATE (2.5 MG/3ML) 0.083% IN NEBU
INHALATION_SOLUTION | RESPIRATORY_TRACT | Status: AC
  Filled 2014-09-20: qty 3

## 2014-09-20 MED ORDER — CALCITRIOL 0.25 MCG PO CAPS
ORAL_CAPSULE | ORAL | Status: AC
  Filled 2014-09-20: qty 1

## 2014-09-20 MED ORDER — PREDNISONE 50 MG PO TABS
60.0000 mg | ORAL_TABLET | Freq: Two times a day (BID) | ORAL | Status: DC
  Administered 2014-09-20 – 2014-09-21 (×2): 60 mg via ORAL
  Filled 2014-09-20 (×4): qty 1

## 2014-09-20 NOTE — Progress Notes (Addendum)
Inpatient Diabetes Program Recommendations  AACE/ADA: New Consensus Statement on Inpatient Glycemic Control (2013)  Target Ranges:  Prepandial:   less than 140 mg/dL      Peak postprandial:   less than 180 mg/dL (1-2 hours)      Critically ill patients:  140 - 180 mg/dL   Results for CALIPH, QUINTO (MRN [id number]) as of 09/20/2014 08:29  Ref. Range 09/19/2014 07:34 09/19/2014 11:48 09/19/2014 16:36 09/19/2014 20:42 09/20/2014 07:44  Glucose-Capillary Latest Ref Range: 65-99 mg/dL 250 (H) 195 (H) 179 (H) 224 (H) 367 (H)   Reason for Admission: COPD exacerbation v. Fluid overload  Diabetes history: Prediabetes/DM patient anemic when A1c drawn (6.7%) Outpatient Diabetes medications: None Current orders for Inpatient glycemic control: 70/30 5 units, Novolog Sensitive + HS scale  Inpatient Diabetes Program Recommendations Insulin - Basal: Glucose this am 367 mg/dl. Patient's weight is 107 Kg. Please consider increasing 70/30 to 15 units BID (0.2 units/kg of basal equivalent 22 units Q24hrs).  Thanks,  Tama Headings RN, MSN, [facility] Inpatient Diabetes Coordinator Team Pager [phone]

## 2014-09-20 NOTE — Progress Notes (Signed)
  Echocardiogram 2D Echocardiogram has been performed.  Darlina Sicilian M 09/20/2014, 3:52 PM

## 2014-09-20 NOTE — Progress Notes (Signed)
Patient ambulated >75 feet O2 sats 94%-96% on room air.  Patient stated the walk made him feel tired. MD notified.

## 2014-09-20 NOTE — Procedures (Signed)
I have seen and examined this patient and agree with the plan of care. No issues seen on dialysis [facility] W 09/20/2014, 10:07 AM

## 2014-09-20 NOTE — Progress Notes (Signed)
[facility] PROGRESS NOTE  Kirk [name] [id number] DOB: Mar 08, 1960 DOA: 09/18/2014  PCP: Estill Dooms, MD  Brief HPI: 54 year old African-American male with a past medical history of end-stage renal disease on hemodialysis, COPD, sleep apnea, presented with shortness of breath. He was recently hospitalized at another facility for similar complaints.  Past medical history:  Past Medical History  Diagnosis Date  . Anxiety   . Chronic headaches   . Dialysis care 02/06/2010  . HTN (hypertension)   . Shortness of breath   . Impotence of organic origin   . Other malaise and fatigue   . Debility, unspecified   . Cellulitis and abscess of trunk   . Kidney replaced by transplant   . Acute edema of lung, unspecified   . Unspecified constipation   . Other chronic nonalcoholic liver disease   . Localization-related (focal) (partial) epilepsy and epileptic syndromes with complex partial seizures, without mention of intractable epilepsy   . Tension headache   . Dermatophytosis of the body   . Gout, unspecified   . Pain in joint, upper arm   . Pain in joint, lower leg   . Other nonspecific abnormal serum enzyme levels   . Essential hypertension, benign   . Acute, but ill-defined, cerebrovascular disease   . Memory loss   . Hypotension, unspecified   . Hypertrophy of prostate without urinary obstruction and other lower urinary tract symptoms (LUTS)   . Renal dialysis status(V45.11) 02/05/2010    restarted 01/02/13 ofter renal trransplant failure  . Insomnia, unspecified   . Secondary hyperparathyroidism (of renal origin)   . Lumbago   . Unspecified vitamin D deficiency   . Carpal tunnel syndrome   . Unspecified hereditary and idiopathic peripheral neuropathy   . Other and unspecified hyperlipidemia   . Unspecified essential hypertension   . Arteriovenous fistula, acquired   . Edema   . Vasectomy status   . Cholelithiasis 07/13/2014  . Sleep apnea     wears the CPAP    . Obstructive sleep apnea (adult) (pediatric)     CPAP  . COPD (chronic obstructive pulmonary disease)   . Chronic kidney disease (CKD)   . End stage renal disease   . 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)   . Pneumonia     March 2016  . Dysrhythmia     history of    Consultants: Nephrology  Procedures: Hemodialysis  Antibiotics: None  Subjective: Patient feels better in terms of his breathing. But still gets short of breath with exertion. Denies any chest pain, nausea, vomiting. Scheduled for dialysis today.  Objective: Vital Signs  Filed Vitals:   09/19/14 2045 09/20/14 0100 09/20/14 0521 09/20/14 0855  BP: 152/73  157/78   Pulse: 76 77 78   Temp: 97.4 F (36.3 C)  98.3 F (36.8 C)   TempSrc: Oral  Oral   Resp: 20  18   Height:      Weight:      SpO2: 97% 94% 98% 98%    Intake/Output Summary (Last 24 hours) at 09/20/14 0859 Last data filed at 09/20/14 [id number]  Gross per 24 hour  Intake   1800 ml  Output     50 ml  Net   1750 ml   Filed Weights   09/18/14 2213  Weight: 107.049 kg (236 lb)    General appearance: alert, cooperative, appears stated age and no distress Resp: Diminished air entry at the bases but no wheezing, crackles or rhonchi. Cardio: regular  rate and rhythm, S1, S2 normal, no murmur, click, rub or gallop GI: soft, non-tender; bowel sounds normal; no masses,  no organomegaly Neurologic: No focal deficits  Lab Results:  Basic Metabolic Panel:  Recent Labs Lab 09/14/14 1609 09/18/14 1352 09/19/14 0844 09/20/14 0347  NA 139 141 135 133*  K 4.7 3.7 4.5 5.4*  CL 99* 100* 97* 95*  CO2 24 25 22  21*  GLUCOSE 200* 130* 226* 240*  BUN 78* 55* 77* 103*  CREATININE 11.13* 9.32* 11.56* 14.08*  CALCIUM 7.9* 7.6* 8.1* 8.2*   Liver Function Tests:  Recent Labs Lab 09/20/14 0347  AST 24  ALT 29  ALKPHOS 54  BILITOT 0.7  PROT 6.2*  ALBUMIN 3.3*   CBC:  Recent Labs Lab 09/14/14 1609 09/18/14 1352 09/19/14 0844  WBC 6.4 5.6  7.3  HGB 10.0* 9.4* 9.5*  HCT 29.7* 28.0* 27.8*  MCV 97.4 96.2 94.6  PLT 91* 100* 101*   Cardiac Enzymes:  Recent Labs Lab 09/18/14 1856 09/19/14 0012 09/19/14 0844  TROPONINI <0.03 <0.03 <0.03   BNP (last 3 results)  Recent Labs  04/06/14 0340 09/09/14 1614 09/18/14 1856  BNP 264.0* 245.0* 304.8*   CBG:  Recent Labs Lab 09/19/14 0734 09/19/14 1148 09/19/14 1636 09/19/14 2042 09/20/14 0744  GLUCAP 250* 195* 179* 224* 367*    Recent Results (from the past 240 hour(s))  MRSA PCR Screening     Status: None   Collection Time: 09/10/14  3:50 PM  Result Value Ref Range Status   MRSA by PCR NEGATIVE NEGATIVE Final    Comment:        The GeneXpert MRSA Assay (FDA approved for NASAL specimens only), is one component of a comprehensive MRSA colonization surveillance program. It is not intended to diagnose MRSA infection nor to guide or monitor treatment for MRSA infections.       Studies/Results: Dg Chest 2 View  09/18/2014   CLINICAL DATA:  Shortness of breath, cough for 2 weeks. History of COPD. Smoker.  EXAM: CHEST  2 VIEW  COMPARISON:  09/09/2014  FINDINGS: Heart is borderline in size. Mild vascular congestion. No confluent opacities or effusions. No acute bony abnormality.  IMPRESSION: Mild vascular congestion.   Electronically Signed   By: Rolm Baptise M.D.   On: 09/18/2014 14:43   Ct Angio Chest Pe W/cm &/or Wo Cm  09/18/2014   CLINICAL DATA:  Shortness of breath and cough with chest pain 12 days. On dialysis.  EXAM: CT ANGIOGRAPHY CHEST WITH CONTRAST  TECHNIQUE: Multidetector CT imaging of the chest was performed using the standard protocol during bolus administration of intravenous contrast. Multiplanar CT image reconstructions and MIPs were obtained to evaluate the vascular anatomy.  CONTRAST:  13mL OMNIPAQUE IOHEXOL 350 MG/ML SOLN  COMPARISON:  09/10/2014 and 06/23/2014 as well as 04/12/2014  FINDINGS: Lungs are adequately inflated without consolidation  or effusion. Very subtle stable nodularity over the lingula and left lower lobe with subtle stable density over the posterior right lower lobe. Likely sequelae from previous infectious/inflammatory process as seen on CT 04/12/2014. Airways are within normal.  Heart is normal in size. Pulmonary arterial system is within normal without evidence of emboli. There is mild calcified plaque over the thoracic aorta. There is no evidence of mediastinal, hilar or axillary adenopathy. Remaining mediastinal structures are within normal.  Images through the upper abdomen are within normal. Remaining bones and soft tissues are within normal.  Review of the MIP images confirms the above findings.  IMPRESSION:  No evidence of pulmonary embolism.  Subtle stable bilateral nodularity as described likely sequelae from previous infectious/inflammatory insult. Recommend followup noncontrast chest CT 1 year.   Electronically Signed   By: Marin Olp M.D.   On: 09/18/2014 17:55    Medications:  Scheduled: . albuterol  2.5 mg Nebulization Q6H  . amLODipine  10 mg Oral Daily  . antiseptic oral rinse  7 mL Mouth Rinse BID  . aspirin EC  81 mg Oral Daily  . budesonide-formoterol  2 puff Inhalation BID  . calcitRIOL      . calcitRIOL  0.25 mcg Oral Q M,W,F-HD  . calcium acetate  2,001 mg Oral TID WC  . carvedilol  25 mg Oral BID  . diphenhydrAMINE  50 mg Oral BID  . doxazosin  2 mg Oral BID  . heparin  5,000 Units Subcutaneous 3 times per day  . insulin aspart  0-5 Units Subcutaneous QHS  . insulin aspart  0-9 Units Subcutaneous TID WC  . insulin aspart protamine- aspart  5 Units Subcutaneous BID WC  . multivitamin  1 tablet Oral QHS  . pravastatin  40 mg Oral QPM  . predniSONE  60 mg Oral BID WC  . sodium chloride  3 mL Intravenous Q12H   Continuous:  [id number] chloride, sodium chloride, acetaminophen **OR** acetaminophen, albuterol, alteplase, feeding supplement (NEPRO CARB STEADY), heparin, heparin, lidocaine  (PF), lidocaine-prilocaine, pentafluoroprop-tetrafluoroeth  Assessment/Plan:  Active Problems:   HTN (hypertension)   DM (diabetes mellitus), type 2 with renal complications   History of kidney transplant   Obesity   Acute respiratory failure with hypoxia   ESRD on dialysis   SOB (shortness of breath)    Acute respiratory failure secondary to COPD exacerbation There is also a component of fluid overload. Patient seems to be doing better. He still feels dyspneic with exertion. Check room air saturations with ambulation. Patient explained that it is likely that his symptoms will persist for a while considering history of smoking and possible COPD.   COPD exacerbation Add inhaled steroids. Taper systemic steroids. CT scan was negative for pulmonary embolism. Nodularity noted over lingula and left lower lobe with subtle stable density over posterior right lower lobe. Repeat CT scan of chest in one year to be arranged by PCP.   Hyperglycemia secondary to steroids Should improve with tapering doses of steroids. HbA1c 6.7. Continue to monitor. Continue sliding scale coverage. Continue 70/30 insulin.  Left-sided chest pain Atypical presentation. Cardiac enzymes negative. Echocardiogram is pending.  End-stage renal disease on hemodialysis Monday, Wednesday, Friday To be dialyzed today. Nephrology is following.  History of essential hypertension Continue current management.  History of hyperlipidemia Continue statin   DVT Prophylaxis: Subcutaneous heparin    Code Status: Full code  Family Communication: Discussed with patient  Disposition Plan: Check room air saturations. Dialysis today. Possible discharge tomorrow.    LOS: 1 day   [facility] Pager [phone] 09/20/2014, 8:59 AM  If 7PM-7AM, please contact night-coverage at [email or web address], password [facility]

## 2014-09-21 DIAGNOSIS — J9601 Acute respiratory failure with hypoxia: Secondary | ICD-10-CM

## 2014-09-21 LAB — BASIC METABOLIC PANEL WITH GFR
Anion gap: 13 (ref 5–15)
BUN: 71 mg/dL — ABNORMAL HIGH (ref 6–20)
CO2: 26 mmol/L (ref 22–32)
Calcium: 8.5 mg/dL — ABNORMAL LOW (ref 8.9–10.3)
Chloride: 98 mmol/L — ABNORMAL LOW (ref 101–111)
Creatinine, Ser: 10.5 mg/dL — ABNORMAL HIGH (ref 0.61–1.24)
GFR calc Af Amer: 6 mL/min — ABNORMAL LOW
GFR calc non Af Amer: 5 mL/min — ABNORMAL LOW
Glucose, Bld: 239 mg/dL — ABNORMAL HIGH (ref 65–99)
Potassium: 4.2 mmol/L (ref 3.5–5.1)
Sodium: 137 mmol/L (ref 135–145)

## 2014-09-21 LAB — GLUCOSE, CAPILLARY
Glucose-Capillary: 216 mg/dL — ABNORMAL HIGH (ref 65–99)
Glucose-Capillary: 202 mg/dL — ABNORMAL HIGH (ref 65–99)

## 2014-09-21 MED ORDER — BUDESONIDE-FORMOTEROL FUMARATE 80-4.5 MCG/ACT IN AERO: 2.0000 | INHALATION_SPRAY | Freq: Two times a day (BID) | RESPIRATORY_TRACT | Status: DC

## 2014-09-21 MED ORDER — PREDNISONE 20 MG PO TABS: ORAL_TABLET | ORAL | Status: DC

## 2014-09-21 MED ORDER — CALCITRIOL 0.25 MCG PO CAPS: 0.2500 ug | ORAL_CAPSULE | ORAL | Status: DC

## 2014-09-21 NOTE — Progress Notes (Signed)
Inpatient Diabetes Program Recommendations  AACE/ADA: New Consensus Statement on Inpatient Glycemic Control (2013)  Target Ranges:  Prepandial:   less than 140 mg/dL      Peak postprandial:   less than 180 mg/dL (1-2 hours)      Critically ill patients:  140 - 180 mg/dL    Inpatient Diabetes Program Recommendations Insulin - Basal: While on prednisone at 60 mg, patient may benefit from another increase in 70/30 to 15 units bid. Presently requires more correction and cbg's remain elevated including fasting and post-prandials.  Thank you Rosita Kea, RN, MSN, CDE  Diabetes Inpatient Program Office: [phone] Pager: [phone] 8:00 am to 5:00 pm

## 2014-09-21 NOTE — Progress Notes (Signed)
Kirk [name] to be D/C'd Home per MD order.  Discussed prescriptions and follow up appointments with the patient. Prescriptions given to patient, medication list explained in detail. Pt verbalized understanding.    Medication List    STOP taking these medications        Cholecalciferol 5000 UNITS Tabs     doxycycline 100 MG tablet  Commonly known as:  VIBRA-TABS      TAKE these medications        acetaminophen 500 MG tablet  Commonly known as:  TYLENOL  Take 1 tablet (500 mg total) by mouth every 6 (six) hours as needed for moderate pain.     albuterol (2.5 MG/3ML) 0.083% nebulizer solution  Commonly known as:  PROVENTIL  Take 3 mLs (2.5 mg total) by nebulization every 6 (six) hours as needed for wheezing.     albuterol-ipratropium 18-103 MCG/ACT inhaler  Commonly known as:  COMBIVENT  Inhale 2 puffs into the lungs 4 (four) times daily.     amLODipine 10 MG tablet  Commonly known as:  NORVASC  Take 1 tablet (10 mg total) by mouth daily.     aspirin EC 81 MG tablet  Take 81 mg by mouth daily.     b complex-vitamin c-folic acid 0.8 MG Tabs tablet  TAKE 1 TABLET BY MOUTH EVERY DAY     BENADRYL 25 MG tablet  Generic drug:  diphenhydrAMINE  Take 50 mg by mouth 2 (two) times daily.     budesonide-formoterol 80-4.5 MCG/ACT inhaler  Commonly known as:  SYMBICORT  Inhale 2 puffs into the lungs 2 (two) times daily.     calcitRIOL 0.25 MCG capsule  Commonly known as:  ROCALTROL  Take 1 capsule (0.25 mcg total) by mouth every Monday, Wednesday, and Friday with hemodialysis.     calcium acetate (Phos Binder) 667 MG/5ML Soln  Commonly known as:  PHOSLYRA  Take 2,001 mg by mouth 3 (three) times daily with meals. And 667 mg with snack     carvedilol 25 MG tablet  Commonly known as:  COREG  Take 1 tablet (25 mg total) by mouth 2 (two) times daily. Take one tablet in the morning and one tablet in the evening     doxazosin 4 MG tablet  Commonly known as:  CARDURA  Take 0.5  tablet  by mouth in the am and 0.5 tablet by mouth in the pm to regulate blood pressure.     guaiFENesin-dextromethorphan 100-10 MG/5ML syrup  Commonly known as:  ROBITUSSIN DM  Take 10 mLs by mouth every 4 (four) hours as needed for cough.     oxyCODONE-acetaminophen 5-325 MG per tablet  Commonly known as:  PERCOCET/ROXICET  Take 1-2 tablets by mouth every 8 (eight) hours as needed for severe pain.     pravastatin 40 MG tablet  Commonly known as:  PRAVACHOL  Take 1 tablet (40 mg total) by mouth every evening.     predniSONE 20 MG tablet  Commonly known as:  DELTASONE  Take 3 tablets once daily for 3 days, then take 2 tablets once daily for 3 days, then take 1 tablet once daily for 3 days and then STOP.     promethazine 25 MG tablet  Commonly known as:  PHENERGAN  Take 1 tablet (25 mg total) by mouth every 8 (eight) hours as needed for nausea or vomiting.     traMADol 50 MG tablet  Commonly known as:  ULTRAM  One up to 4 times daly  if needed for pain        Filed Vitals:   09/21/14 0754  BP: 144/76  Pulse: 80  Temp: 98.3 F (36.8 C)  Resp: 19    Skin clean, dry and intact without evidence of skin break down, no evidence of skin tears noted. IV catheter discontinued intact. Site without signs and symptoms of complications. Dressing and pressure applied. Pt denies pain at this time. No complaints noted.  An After Visit Summary was printed and given to the patient. Patient escorted via [place], and D/C home via private auto.  Kirk [name] A 09/21/2014 1:14 PM

## 2014-09-21 NOTE — Progress Notes (Signed)
[facility] Progress Note  Assessment/Plan: 1. SOB/cough hx COPD- poss vol excess as cause; I suspect he has lost weight and needs EDW lowered based on exam, ^ BP; he thinks he can wait for HD until tomorrow. Repeat Echo EF 55-60% valves ok, no per. effusion 2. ESRD - MWF - got 0.5 below edw yesterday with net UF 5 L - can lower EDW to 104 3. Hypertension/volume - better with decrease in volume - continue to titrate EDW at outpt unit if LE edema or ^ BP; discussed watching fluids too 4. Anemia - Hgb 9.5 - down 1 gm from outpt Hgb; if Hgb drops further can redose ESA on Wednesday - otherwise due 9/7; Had been off [place] since mid July - just resumed [id number]. 5. Metabolic bone disease - Continue low dose calcitriol/binders - takes 3-4 phoslo ac - no fosrenol as binder 6. Nutrition - renal carb mod diet; 7. DM -sugars up likely from steroids; his perception is that he doesn't really have DM - that his sugars are up just from the prednisone. Monthly blood sugars have not been followed at HD unit nor have Hgb A1C - need to order after d/c 8. Hx tobacco abuse - quit very recently 38. Thrombocytopenia - chronic - follow - generally 90 - 120 at outpt HD unit - followed monthly  Myriam Jacobson, PA-C [facility] Beeper [phone] 09/21/2014,11:09 AM  LOS: 2 days   Subjective:   Feels good, slight cramp at HD yesterday - agrees EDW needs to be lowered  Objective Filed Vitals:   09/20/14 2140 09/21/14 0533 09/21/14 0732 09/21/14 0754  BP: 151/72 131/59  144/76  Pulse: 87 77  80  Temp: 98.2 F (36.8 C) 98 F (36.7 C)  98.3 F (36.8 C)  TempSrc: Oral Oral  Oral  Resp: 20 20  19   Height:      Weight:      SpO2: 97% 95% 98% 96%   Physical Exam General: NAD Heart: RRR Lungs: no rales Abdomen: soft Extremities: tr LE edema bilat Dialysis Access: left AVF + bruit  Dialysis Orders: [facility] MWF 4 hr 180 EDW 105 BFR 375 DFR 800 2 K 2.25 Ca left AVF heparin 4000  Mircera 100 q 2 weeks - last 8/24, calcitriol 0.25 Recent labs; Hgb 10.5 8/17 63% sat iPTH 365 Ca/P ok  Additional Objective Labs: Basic Metabolic Panel:  Recent Labs Lab 09/19/14 0844 09/20/14 0347 09/21/14 0718  NA 135 133* 137  K 4.5 5.4* 4.2  CL 97* 95* 98*  CO2 22 21* 26  GLUCOSE 226* 240* 239*  BUN 77* 103* 71*  CREATININE 11.56* 14.08* 10.50*  CALCIUM 8.1* 8.2* 8.5*   Liver Function Tests:  Recent Labs Lab 09/20/14 0347  AST 24  ALT 29  ALKPHOS 54  BILITOT 0.7  PROT 6.2*  ALBUMIN 3.3*   CBC:  Recent Labs Lab 09/14/14 1609 09/18/14 1352 09/19/14 0844  WBC 6.4 5.6 7.3  HGB 10.0* 9.4* 9.5*  HCT 29.7* 28.0* 27.8*  MCV 97.4 96.2 94.6  PLT 91* 100* 101*   Cardiac Enzymes:  Recent Labs Lab 09/18/14 1856 09/19/14 0012 09/19/14 0844  TROPONINI <0.03 <0.03 <0.03   CBG:  Recent Labs Lab 09/20/14 0744 09/20/14 1404 09/20/14 1631 09/20/14 2138 09/21/14 0752  GLUCAP 367* 217* 343* 312* 216*  Medications:   . albuterol  2.5 mg Nebulization QID  . amLODipine  10 mg Oral Daily  . antiseptic oral rinse  7 mL Mouth Rinse  BID  . aspirin EC  81 mg Oral Daily  . budesonide-formoterol  2 puff Inhalation BID  . calcitRIOL  0.25 mcg Oral Q M,W,F-HD  . calcium acetate  2,001 mg Oral TID WC  . carvedilol  25 mg Oral BID  . diphenhydrAMINE  50 mg Oral BID  . doxazosin  2 mg Oral BID  . heparin  5,000 Units Subcutaneous 3 times per day  . insulin aspart  0-5 Units Subcutaneous QHS  . insulin aspart  0-9 Units Subcutaneous TID WC  . insulin aspart protamine- aspart  10 Units Subcutaneous BID WC  . multivitamin  1 tablet Oral QHS  . pravastatin  40 mg Oral QPM  . predniSONE  60 mg Oral BID WC  . sodium chloride  3 mL Intravenous Q12H

## 2014-09-21 NOTE — Discharge Instructions (Signed)

## 2014-09-21 NOTE — Discharge Summary (Signed)
[facility]  Physician Discharge Summary   Patient ID: Kirk [name] MRN: [id number] DOB/AGE: 52-Apr-1962 52 y.o.  Admit date: 09/18/2014 Discharge date: 09/21/2014  PCP: Estill Dooms, MD  DISCHARGE DIAGNOSES:  Active Problems:   HTN (hypertension)   DM (diabetes mellitus), type 2 with renal complications   History of kidney transplant   Obesity   Acute respiratory failure with hypoxia   ESRD on dialysis   SOB (shortness of breath)   RECOMMENDATIONS FOR OUTPATIENT FOLLOW UP: 1. Dr. Marlou Starks will contact patient to reschedule surgery 2. Patient is to have his blood sugar checked early next week. 3. Repeat CT scan of the chest in one year to follow-up on nodularity   DISCHARGE CONDITION: fair  Diet recommendation: Modified carbohydrate  Filed Weights   09/18/14 2213 09/20/14 0905 09/20/14 1322  Weight: 107.049 kg (236 lb) 110.1 kg (242 lb 11.6 oz) 104.5 kg (230 lb 6.1 oz)    INITIAL HISTORY: 54 year old African-American male with a past medical history of end-stage renal disease on hemodialysis, COPD, sleep apnea, presented with shortness of breath. He was recently hospitalized at another facility for similar complaints.  Consultations:  Nephrology  Procedures:  Hemodialysis  2-D echocardiogram Study Conclusions - Left ventricle: The cavity size was normal. Systolic function wasnormal. The estimated ejection fraction was in the range of 55%to 60%. Wall motion was normal; there were no regional wallmotion abnormalities. Impressions:- Compared to the prior study, there has been no significantinterval change.  HOSPITAL COURSE:   Acute respiratory failure secondary There was likely also a component of fluid overload. Patient was given nebulizer treatments, steroids. He was dialyzed. He started improving quickly. He was ambulated in the hallway without any hypoxia. Today he feels much better. He has quit smoking. He was congratulated.   Acute COPD  exacerbation/acute bronchitis Treatment as outlined above. Patient has improved. CT scan was negative for pulmonary embolism. Inhaled steroids were ordered. This will be prescribed as well. Tapering doses of systemic steroids. On CT scan Nodularity noted over lingula and left lower lobe with subtle stable density over posterior right lower lobe. Repeat CT scan of chest in one year to be arranged by PCP.   Hyperglycemia secondary to steroids HbA1c was 6.7. Patient likely has diabetes. He does not want to be placed on treatment. He states that his sugars are high due to steroids. This is most likely the case as well. I have asked him to discuss this further with his primary care physician. He should have his blood sugar checked early next week.   Left-sided chest pain Atypical presentation. Cardiac enzymes negative. Echocardiogram is as above.  End-stage renal disease on hemodialysis Monday, Wednesday, Friday Age and is seen by nephrology. He was dialyzed as per his usual schedule.  History of essential hypertension Continue current management.  History of hyperlipidemia Continue statin  Patient is scheduled to undergo cholecystectomy on Thursday. Considering 2 hospitalizations for respiratory issues/bronchitis/COPD his risk of respiratory complications in the perioperative period is high. If surgery is not urgent, this should ideally be postponed for at least 2 weeks. This has been communicated to the patient and his surgeon, Dr. Marlou Starks.  Patient is stable today. He feels much better. Okay for discharge.  PERTINENT LABS:  The results of significant diagnostics from this hospitalization (including imaging, microbiology, ancillary and laboratory) are listed below for reference.      Labs: Basic Metabolic Panel:  Recent Labs Lab 09/14/14 1609 09/18/14 1352 09/19/14 [id number] 09/20/14 [id number] 09/21/14 [id number]  NA 139 141 135 133* 137  K 4.7 3.7 4.5 5.4* 4.2  CL 99* 100* 97* 95* 98*  CO2 24 25  22  21* 26  GLUCOSE 200* 130* 226* 240* 239*  BUN 78* 55* 77* 103* 71*  CREATININE 11.13* 9.32* 11.56* 14.08* 10.50*  CALCIUM 7.9* 7.6* 8.1* 8.2* 8.5*   Liver Function Tests:  Recent Labs Lab 09/20/14 0347  AST 24  ALT 29  ALKPHOS 54  BILITOT 0.7  PROT 6.2*  ALBUMIN 3.3*   CBC:  Recent Labs Lab 09/14/14 1609 09/18/14 1352 09/19/14 0844  WBC 6.4 5.6 7.3  HGB 10.0* 9.4* 9.5*  HCT 29.7* 28.0* 27.8*  MCV 97.4 96.2 94.6  PLT 91* 100* 101*   Cardiac Enzymes:  Recent Labs Lab 09/18/14 1856 09/19/14 0012 09/19/14 0844  TROPONINI <0.03 <0.03 <0.03   BNP: BNP (last 3 results)  Recent Labs  04/06/14 0340 09/09/14 1614 09/18/14 1856  BNP 264.0* 245.0* 304.8*    CBG:  Recent Labs Lab 09/20/14 1404 09/20/14 1631 09/20/14 2138 09/21/14 0752 09/21/14 1152  GLUCAP 217* 343* 312* 216* 202*     IMAGING STUDIES Dg Chest 2 View  09/18/2014   CLINICAL DATA:  Shortness of breath, cough for 2 weeks. History of COPD. Smoker.  EXAM: CHEST  2 VIEW  COMPARISON:  09/09/2014  FINDINGS: Heart is borderline in size. Mild vascular congestion. No confluent opacities or effusions. No acute bony abnormality.  IMPRESSION: Mild vascular congestion.   Electronically Signed   By: Rolm Baptise M.D.   On: 09/18/2014 14:43    Ct Angio Chest Pe W/cm &/or Wo Cm  09/18/2014   CLINICAL DATA:  Shortness of breath and cough with chest pain 12 days. On dialysis.  EXAM: CT ANGIOGRAPHY CHEST WITH CONTRAST  TECHNIQUE: Multidetector CT imaging of the chest was performed using the standard protocol during bolus administration of intravenous contrast. Multiplanar CT image reconstructions and MIPs were obtained to evaluate the vascular anatomy.  CONTRAST:  127mL OMNIPAQUE IOHEXOL 350 MG/ML SOLN  COMPARISON:  09/10/2014 and 06/23/2014 as well as 04/12/2014  FINDINGS: Lungs are adequately inflated without consolidation or effusion. Very subtle stable nodularity over the lingula and left lower lobe with  subtle stable density over the posterior right lower lobe. Likely sequelae from previous infectious/inflammatory process as seen on CT 04/12/2014. Airways are within normal.  Heart is normal in size. Pulmonary arterial system is within normal without evidence of emboli. There is mild calcified plaque over the thoracic aorta. There is no evidence of mediastinal, hilar or axillary adenopathy. Remaining mediastinal structures are within normal.  Images through the upper abdomen are within normal. Remaining bones and soft tissues are within normal.  Review of the MIP images confirms the above findings.  IMPRESSION: No evidence of pulmonary embolism.  Subtle stable bilateral nodularity as described likely sequelae from previous infectious/inflammatory insult. Recommend followup noncontrast chest CT 1 year.   Electronically Signed   By: Marin Olp M.D.   On: 09/18/2014 17:55    DISCHARGE EXAMINATION: Filed Vitals:   09/21/14 0533 09/21/14 0732 09/21/14 0754 09/21/14 1159  BP: 131/59  144/76   Pulse: 77  80   Temp: 98 F (36.7 C)  98.3 F (36.8 C)   TempSrc: Oral  Oral   Resp: 20  19   Height:      Weight:      SpO2: 95% 98% 96% 95%   General appearance: alert, cooperative, appears stated age and no distress Resp: clear to  auscultation bilaterally Cardio: regular rate and rhythm, S1, S2 normal, no murmur, click, rub or gallop GI: soft, non-tender; bowel sounds normal; no masses,  no organomegaly  DISPOSITION: Home  Discharge Instructions    Call MD for:  difficulty breathing, headache or visual disturbances    Complete by:  As directed      Call MD for:  extreme fatigue    Complete by:  As directed      Call MD for:  persistant dizziness or light-headedness    Complete by:  As directed      Call MD for:  persistant nausea and vomiting    Complete by:  As directed      Call MD for:  redness, tenderness, or signs of infection (pain, swelling, redness, odor or green/yellow discharge around  incision site)    Complete by:  As directed      Call MD for:  severe uncontrolled pain    Complete by:  As directed      Diet Carb Modified    Complete by:  As directed      Discharge instructions    Complete by:  As directed   Please stop smoking. Please talk to your PCP regarding referral to a lung specialist if symptoms recur. Dr. Marlou Starks will reschedule surgery. Please have your blood sugar checked in a few days.  You were cared for by a hospitalist during your hospital stay. If you have any questions about your discharge medications or the care you received while you were in the hospital after you are discharged, you can call the unit and asked to speak with the hospitalist on call if the hospitalist that took care of you is not available. Once you are discharged, your primary care physician will handle any further medical issues. Please note that NO REFILLS for any discharge medications will be authorized once you are discharged, as it is imperative that you return to your primary care physician (or establish a relationship with a primary care physician if you do not have one) for your aftercare needs so that they can reassess your need for medications and monitor your lab values. If you do not have a primary care physician, you can call [phone] for a physician referral.     Increase activity slowly    Complete by:  As directed            ALLERGIES:  Allergies  Allergen Reactions  . Codeine Nausea And Vomiting and Nausea Only      Discharge Medication List as of 09/21/2014 12:00 PM    START taking these medications   Details  budesonide-formoterol (SYMBICORT) 80-4.5 MCG/ACT inhaler Inhale 2 puffs into the lungs 2 (two) times daily., Starting 09/21/2014, Until Discontinued, Print    calcitRIOL (ROCALTROL) 0.25 MCG capsule Take 1 capsule (0.25 mcg total) by mouth every Monday, Wednesday, and Friday with hemodialysis., Starting 09/21/2014, Until Discontinued, Print      CONTINUE  these medications which have CHANGED   Details  predniSONE (DELTASONE) 20 MG tablet Take 3 tablets once daily for 3 days, then take 2 tablets once daily for 3 days, then take 1 tablet once daily for 3 days and then STOP., Print      CONTINUE these medications which have NOT CHANGED   Details  acetaminophen (TYLENOL) 500 MG tablet Take 1 tablet (500 mg total) by mouth every 6 (six) hours as needed for moderate pain., Starting 09/12/2014, Until Discontinued, No Print    albuterol (PROVENTIL) (  2.5 MG/3ML) 0.083% nebulizer solution Take 3 mLs (2.5 mg total) by nebulization every 6 (six) hours as needed for wheezing., Starting 04/16/2014, Until Discontinued, Print    albuterol-ipratropium (COMBIVENT) 18-103 MCG/ACT inhaler Inhale 2 puffs into the lungs 4 (four) times daily., Starting 09/12/2014, Until Discontinued, No Print    amLODipine (NORVASC) 10 MG tablet Take 1 tablet (10 mg total) by mouth daily., Starting 03/29/2014, Until Discontinued, Normal    aspirin EC 81 MG tablet Take 81 mg by mouth daily., Until Discontinued, Historical Med    b complex-vitamin c-folic acid (NEPHRO-VITE) 0.8 MG TABS tablet TAKE 1 TABLET BY MOUTH EVERY DAY, Normal    calcium acetate, Phos Binder, (PHOSLYRA) 667 MG/5ML SOLN Take 2,001 mg by mouth 3 (three) times daily with meals. And 667 mg with snack, Until Discontinued, Historical Med    carvedilol (COREG) 25 MG tablet Take 1 tablet (25 mg total) by mouth 2 (two) times daily. Take one tablet in the morning and one tablet in the evening, Starting 03/29/2014, Until Discontinued, Normal    diphenhydrAMINE (BENADRYL) 25 MG tablet Take 50 mg by mouth 2 (two) times daily. , Until Discontinued, Historical Med    doxazosin (CARDURA) 4 MG tablet Take 0.5 tablet  by mouth in the am and 0.5 tablet by mouth in the pm to regulate blood pressure., No Print    guaiFENesin-dextromethorphan (ROBITUSSIN DM) 100-10 MG/5ML syrup Take 10 mLs by mouth every 4 (four) hours as needed for  cough., Until Discontinued, Historical Med    oxyCODONE-acetaminophen (PERCOCET/ROXICET) 5-325 MG per tablet Take 1-2 tablets by mouth every 8 (eight) hours as needed for severe pain., Starting 08/25/2014, Until Discontinued, Print    pravastatin (PRAVACHOL) 40 MG tablet Take 1 tablet (40 mg total) by mouth every evening., Starting 03/29/2014, Until Discontinued, Normal    promethazine (PHENERGAN) 25 MG tablet Take 1 tablet (25 mg total) by mouth every 8 (eight) hours as needed for nausea or vomiting., Starting 07/13/2014, Until Discontinued, Print    traMADol (ULTRAM) 50 MG tablet One up to 4 times daly if needed for pain, Print      STOP taking these medications     Cholecalciferol 5000 UNITS TABS      doxycycline (VIBRA-TABS) 100 MG tablet        Follow-up Information    Follow up with GREEN, [name], MD. Schedule an appointment as soon as possible for a visit in 1 week.   Specialty:  Internal Medicine   Why:  post hospitalization follow up   Contact information:   [place] [id number] [phone]       Follow up with Merrie Roof, MD.   Specialty:  General Surgery   Why:  his office will contact you to reschedule surgery   Contact information:   [address] [place]  [address] [phone]       TOTAL DISCHARGE TIME: 35 minutes  [facility] Pager [phone]  09/21/2014, 3:44 PM

## 2014-09-22 DIAGNOSIS — D631 Anemia in chronic kidney disease: Secondary | ICD-10-CM | POA: Diagnosis not present

## 2014-09-22 DIAGNOSIS — Z992 Dependence on renal dialysis: Secondary | ICD-10-CM | POA: Diagnosis not present

## 2014-09-22 DIAGNOSIS — N2581 Secondary hyperparathyroidism of renal origin: Secondary | ICD-10-CM | POA: Diagnosis not present

## 2014-09-22 DIAGNOSIS — N186 End stage renal disease: Secondary | ICD-10-CM | POA: Diagnosis not present

## 2014-09-22 DIAGNOSIS — T8612 Kidney transplant failure: Secondary | ICD-10-CM | POA: Diagnosis not present

## 2014-09-22 DIAGNOSIS — D509 Iron deficiency anemia, unspecified: Secondary | ICD-10-CM | POA: Diagnosis not present

## 2014-09-24 DIAGNOSIS — D631 Anemia in chronic kidney disease: Secondary | ICD-10-CM | POA: Diagnosis not present

## 2014-09-24 DIAGNOSIS — Z23 Encounter for immunization: Secondary | ICD-10-CM | POA: Diagnosis not present

## 2014-09-24 DIAGNOSIS — N2581 Secondary hyperparathyroidism of renal origin: Secondary | ICD-10-CM | POA: Diagnosis not present

## 2014-09-24 DIAGNOSIS — E089 Diabetes mellitus due to underlying condition without complications: Secondary | ICD-10-CM | POA: Diagnosis not present

## 2014-09-24 DIAGNOSIS — N186 End stage renal disease: Secondary | ICD-10-CM | POA: Diagnosis not present

## 2014-09-24 DIAGNOSIS — E8779 Other fluid overload: Secondary | ICD-10-CM | POA: Diagnosis not present

## 2014-09-27 DIAGNOSIS — E089 Diabetes mellitus due to underlying condition without complications: Secondary | ICD-10-CM | POA: Diagnosis not present

## 2014-09-27 DIAGNOSIS — N186 End stage renal disease: Secondary | ICD-10-CM | POA: Diagnosis not present

## 2014-09-27 DIAGNOSIS — D631 Anemia in chronic kidney disease: Secondary | ICD-10-CM | POA: Diagnosis not present

## 2014-09-27 DIAGNOSIS — N2581 Secondary hyperparathyroidism of renal origin: Secondary | ICD-10-CM | POA: Diagnosis not present

## 2014-09-27 DIAGNOSIS — Z23 Encounter for immunization: Secondary | ICD-10-CM | POA: Diagnosis not present

## 2014-09-28 DIAGNOSIS — D631 Anemia in chronic kidney disease: Secondary | ICD-10-CM | POA: Diagnosis not present

## 2014-09-28 DIAGNOSIS — N186 End stage renal disease: Secondary | ICD-10-CM | POA: Diagnosis not present

## 2014-09-28 DIAGNOSIS — Z23 Encounter for immunization: Secondary | ICD-10-CM | POA: Diagnosis not present

## 2014-09-28 DIAGNOSIS — N2581 Secondary hyperparathyroidism of renal origin: Secondary | ICD-10-CM | POA: Diagnosis not present

## 2014-09-28 DIAGNOSIS — E089 Diabetes mellitus due to underlying condition without complications: Secondary | ICD-10-CM | POA: Diagnosis not present

## 2014-09-29 DIAGNOSIS — N186 End stage renal disease: Secondary | ICD-10-CM | POA: Diagnosis not present

## 2014-09-29 DIAGNOSIS — D631 Anemia in chronic kidney disease: Secondary | ICD-10-CM | POA: Diagnosis not present

## 2014-09-29 DIAGNOSIS — Z23 Encounter for immunization: Secondary | ICD-10-CM | POA: Diagnosis not present

## 2014-09-29 DIAGNOSIS — N2581 Secondary hyperparathyroidism of renal origin: Secondary | ICD-10-CM | POA: Diagnosis not present

## 2014-09-29 DIAGNOSIS — E089 Diabetes mellitus due to underlying condition without complications: Secondary | ICD-10-CM | POA: Diagnosis not present

## 2014-10-01 DIAGNOSIS — Z23 Encounter for immunization: Secondary | ICD-10-CM | POA: Diagnosis not present

## 2014-10-01 DIAGNOSIS — N2581 Secondary hyperparathyroidism of renal origin: Secondary | ICD-10-CM | POA: Diagnosis not present

## 2014-10-01 DIAGNOSIS — N186 End stage renal disease: Secondary | ICD-10-CM | POA: Diagnosis not present

## 2014-10-01 DIAGNOSIS — E089 Diabetes mellitus due to underlying condition without complications: Secondary | ICD-10-CM | POA: Diagnosis not present

## 2014-10-01 DIAGNOSIS — D631 Anemia in chronic kidney disease: Secondary | ICD-10-CM | POA: Diagnosis not present

## 2014-10-04 DIAGNOSIS — E089 Diabetes mellitus due to underlying condition without complications: Secondary | ICD-10-CM | POA: Diagnosis not present

## 2014-10-04 DIAGNOSIS — N186 End stage renal disease: Secondary | ICD-10-CM | POA: Diagnosis not present

## 2014-10-04 DIAGNOSIS — N2581 Secondary hyperparathyroidism of renal origin: Secondary | ICD-10-CM | POA: Diagnosis not present

## 2014-10-04 DIAGNOSIS — D631 Anemia in chronic kidney disease: Secondary | ICD-10-CM | POA: Diagnosis not present

## 2014-10-04 DIAGNOSIS — Z23 Encounter for immunization: Secondary | ICD-10-CM | POA: Diagnosis not present

## 2014-10-05 ENCOUNTER — Ambulatory Visit (INDEPENDENT_AMBULATORY_CARE_PROVIDER_SITE_OTHER): Payer: Medicare Other | Admitting: Internal Medicine

## 2014-10-05 ENCOUNTER — Encounter: Payer: Self-pay | Admitting: Internal Medicine

## 2014-10-05 VITALS — BP 122/72 | HR 84 | Temp 98.2°F | Resp 20 | Ht 67.0 in | Wt 235.2 lb

## 2014-10-05 DIAGNOSIS — K8020 Calculus of gallbladder without cholecystitis without obstruction: Secondary | ICD-10-CM | POA: Diagnosis not present

## 2014-10-05 DIAGNOSIS — J9601 Acute respiratory failure with hypoxia: Secondary | ICD-10-CM | POA: Diagnosis not present

## 2014-10-05 DIAGNOSIS — E785 Hyperlipidemia, unspecified: Secondary | ICD-10-CM

## 2014-10-05 DIAGNOSIS — N189 Chronic kidney disease, unspecified: Secondary | ICD-10-CM

## 2014-10-05 DIAGNOSIS — I10 Essential (primary) hypertension: Secondary | ICD-10-CM | POA: Diagnosis not present

## 2014-10-05 DIAGNOSIS — J4540 Moderate persistent asthma, uncomplicated: Secondary | ICD-10-CM

## 2014-10-05 DIAGNOSIS — D631 Anemia in chronic kidney disease: Secondary | ICD-10-CM | POA: Diagnosis not present

## 2014-10-05 DIAGNOSIS — J449 Chronic obstructive pulmonary disease, unspecified: Secondary | ICD-10-CM | POA: Diagnosis not present

## 2014-10-05 DIAGNOSIS — Z992 Dependence on renal dialysis: Secondary | ICD-10-CM | POA: Diagnosis not present

## 2014-10-05 DIAGNOSIS — N186 End stage renal disease: Secondary | ICD-10-CM

## 2014-10-05 DIAGNOSIS — R609 Edema, unspecified: Secondary | ICD-10-CM

## 2014-10-05 MED ORDER — DOXAZOSIN MESYLATE 4 MG PO TABS: ORAL_TABLET | ORAL | Status: DC

## 2014-10-05 MED ORDER — OXYCODONE-ACETAMINOPHEN 5-325 MG PO TABS: 1.0000 | ORAL_TABLET | Freq: Three times a day (TID) | ORAL | Status: DC | PRN

## 2014-10-05 MED ORDER — AMLODIPINE BESYLATE 10 MG PO TABS: 10.0000 mg | ORAL_TABLET | Freq: Every day | ORAL | Status: DC

## 2014-10-05 MED ORDER — PRAVASTATIN SODIUM 40 MG PO TABS: 40.0000 mg | ORAL_TABLET | Freq: Every evening | ORAL | Status: DC

## 2014-10-05 MED ORDER — NEPHRO-VITE 0.8 MG PO TABS: 1.0000 | ORAL_TABLET | Freq: Every day | ORAL | Status: DC

## 2014-10-05 MED ORDER — CARVEDILOL 25 MG PO TABS: 25.0000 mg | ORAL_TABLET | Freq: Two times a day (BID) | ORAL | Status: DC

## 2014-10-05 NOTE — Progress Notes (Signed)
Patient ID: Kirk [name], male   DOB: 23-Sep-1960, 54 y.o.   MRN: [id number]     Facility  [facility]    Place of Service:   OFFICE    Allergies  Allergen Reactions  . Codeine Nausea And Vomiting and Nausea Only    Chief Complaint  Patient presents with  . Hospitalization Follow-up    follow-up from hospital stay    HPI:  Hospitalized 09/18/2014 through 09/21/2014 for dyspnea. There was a presumed fluid overload and patient was diuresed. He started improving quickly after this. Workup included 2-D echocardiogram which showed LVEF 55-60%.  Known cholelithiasis with surgery now rescheduled by Dr. Marlou Starks to 10/21/2014. Patient wife are not sure that he is doing well enough with his pulmonary function to safely go through the surgery. He would like a pulmonary consultation prior to the surgery. He has previously seen Dr. Halford Chessman.  Patient has known COPD and was felt to have had an acute exacerbation with component of fluid overload as well. Tapering doses of steroid's were prescribed: He remains on this tapering dose.  There was left-sided chest pain while in the hospital, but cardiac enzymes were negative.  Patient has end-stage renal disease and is on hemodialysis Monday, Wednesday, and Friday.   Medications: Patient's Medications  New Prescriptions   No medications on file  Previous Medications   ACETAMINOPHEN (TYLENOL) 500 MG TABLET    Take 1 tablet (500 mg total) by mouth every 6 (six) hours as needed for moderate pain.   ALBUTEROL (PROVENTIL) (2.5 MG/3ML) 0.083% NEBULIZER SOLUTION    Take 3 mLs (2.5 mg total) by nebulization every 6 (six) hours as needed for wheezing.   ALBUTEROL-IPRATROPIUM (COMBIVENT) 18-103 MCG/ACT INHALER    Inhale 2 puffs into the lungs 4 (four) times daily.   AMLODIPINE (NORVASC) 10 MG TABLET    Take 1 tablet (10 mg total) by mouth daily.   ASPIRIN EC 81 MG TABLET    Take 81 mg by mouth daily.   B COMPLEX-VITAMIN C-FOLIC ACID (NEPHRO-VITE) 0.8 MG TABS TABLET     TAKE 1 TABLET BY MOUTH EVERY DAY   BUDESONIDE-FORMOTEROL (SYMBICORT) 80-4.5 MCG/ACT INHALER    Inhale 2 puffs into the lungs 2 (two) times daily.   CALCITRIOL (ROCALTROL) 0.25 MCG CAPSULE    Take 1 capsule (0.25 mcg total) by mouth every Monday, Wednesday, and Friday with hemodialysis.   CALCIUM ACETATE, PHOS BINDER, (PHOSLYRA) 667 MG/5ML SOLN    Take 2,001 mg by mouth 3 (three) times daily with meals. And 667 mg with snack   CARVEDILOL (COREG) 25 MG TABLET    Take 1 tablet (25 mg total) by mouth 2 (two) times daily. Take one tablet in the morning and one tablet in the evening   DIPHENHYDRAMINE (BENADRYL) 25 MG TABLET    Take 50 mg by mouth 2 (two) times daily.    DOXAZOSIN (CARDURA) 4 MG TABLET    Take 0.5 tablet  by mouth in the am and 0.5 tablet by mouth in the pm to regulate blood pressure.   GUAIFENESIN-DEXTROMETHORPHAN (ROBITUSSIN DM) 100-10 MG/5ML SYRUP    Take 10 mLs by mouth every 4 (four) hours as needed for cough.   OXYCODONE-ACETAMINOPHEN (PERCOCET/ROXICET) 5-325 MG PER TABLET    Take 1-2 tablets by mouth every 8 (eight) hours as needed for severe pain.   PRAVASTATIN (PRAVACHOL) 40 MG TABLET    Take 1 tablet (40 mg total) by mouth every evening.   PREDNISONE (DELTASONE) 20 MG TABLET  TAKE 3 TABLETS BY MOUTH X 3 DAYS, THEN 2 TABS X 3 DAYS, THEN 1 TAB X 3 DAYS,THEN STOP   PROMETHAZINE (PHENERGAN) 25 MG TABLET    Take 1 tablet (25 mg total) by mouth every 8 (eight) hours as needed for nausea or vomiting.   TRAMADOL (ULTRAM) 50 MG TABLET    One up to 4 times daly if needed for pain  Modified Medications   No medications on file  Discontinued Medications   PREDNISONE (DELTASONE) 20 MG TABLET    Take 3 tablets once daily for 3 days, then take 2 tablets once daily for 3 days, then take 1 tablet once daily for 3 days and then STOP.     Review of Systems  Constitutional: Negative for fever.       Chronically fatigued.  HENT: Positive for ear discharge and hearing loss. Negative for ear  pain and tinnitus.   Eyes: Negative.   Respiratory: Negative for shortness of breath.        Dyspnea on exertion. Pneumonia. Hospitalized 04/06/14 and 06/23/2014. Rehospitalized 09/18/2014 for increasing dyspnea felt secondary to acute exacerbation of his COPD plus fluid overload.  Cardiovascular: Negative for chest pain, palpitations and leg swelling.  Gastrointestinal: Negative for abdominal pain and abdominal distention.       Cholelithiasis  Endocrine:       Diabetes is not under as good a control as is usual for the patient, due to recent use of steroid's because of his pulmonary distress.  Genitourinary: Negative.        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  Musculoskeletal:       Neck discomfort. Chronic right knee discomfort. Chronic back pains. Unsteady gait.  Neurological: Positive for dizziness.       Memory is worse. Feet feel numb. Recurrent headaches  Hematological:       Anemia of CKD.  Psychiatric/Behavioral: Positive for confusion and decreased concentration.       Insomnia. Falls asleep in the day.    Filed Vitals:   10/05/14 1552  BP: 122/72  Pulse: 84  Temp: 98.2 F (36.8 C)  TempSrc: Oral  Resp: 20  Height: $Remove'5\' 7"'EoNKgOq$  (1.702 m)  Weight: 235 lb 3.2 oz (106.686 kg)  SpO2: 97%   Body mass index is 36.83 kg/(m^2).  Physical Exam  Constitutional: He is oriented to person, place, and time.  Obese.  HENT:  Loss of hearing.  Eyes:  Corrective lenses  Neck: No JVD present. No tracheal deviation present. No thyromegaly present.  Cardiovascular: Exam reveals no gallop.   No murmur heard. AF. 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  Abdominal: Soft. Bowel sounds are normal. He exhibits no distension and no mass. There is no tenderness.  Tender in the RUQ. Frequent nausea.  Musculoskeletal: Normal range of motion. He exhibits tenderness. He exhibits no edema.  Scar left knee. Tender right knee  Lymphadenopathy:     He has no cervical adenopathy.  Neurological: He is alert and oriented to person, place, and time. No cranial nerve deficit. Coordination normal.  Positive Tinel sign bilaterally  Skin: No rash noted. No erythema. No pallor.  Psychiatric:  Drowsy during the exam.     Labs reviewed: Lab Summary Latest Ref Rng 09/21/2014 09/20/2014 09/19/2014 09/18/2014  Hemoglobin 13.0 - 17.0 g/dL (None) (None) 9.5(L) 9.4(L)  Hematocrit 39.0 - 52.0 % (None) (None) 27.8(L) 28.0(L)  White count 4.0 - 10.5 K/uL (None) (None) 7.3 5.6  Platelet count 150 - 400 K/uL (None) (None) 101(L) 100(L)  Sodium 135 - 145 mmol/L 137 133(L) 135 141  Potassium 3.5 - 5.1 mmol/L 4.2 5.4(H) 4.5 3.7  Calcium 8.9 - 10.3 mg/dL 8.5(L) 8.2(L) 8.1(L) 7.6(L)  Phosphorus - (None) (None) (None) (None)  Creatinine 0.61 - 1.24 mg/dL 10.50(H) 14.08(H) 11.56(H) 9.32(H)  AST 15 - 41 U/L (None) 24 (None) (None)  Alk Phos 38 - 126 U/L (None) 54 (None) (None)  Bilirubin 0.3 - 1.2 mg/dL (None) 0.7 (None) (None)  Glucose 65 - 99 mg/dL 239(H) 240(H) 226(H) 130(H)  Cholesterol - (None) (None) (None) (None)  HDL cholesterol - (None) (None) (None) (None)  Triglycerides - (None) (None) (None) (None)  LDL Direct - (None) (None) (None) (None)  LDL Calc - (None) (None) (None) (None)  Total protein 6.5 - 8.1 g/dL (None) 6.2(L) (None) (None)  Albumin 3.5 - 5.0 g/dL (None) 3.3(L) (None) (None)   Lab Results  Component Value Date   TSH 2.466  12/17/2006   Lab Results  Component Value Date   BUN 71* 09/21/2014   Lab Results  Component Value Date   HGBA1C 6.7* 09/14/2014       Assessment/Plan  1. Acute respiratory failure with hypoxia Patient has had 3 hospitalizations in the last 3 months for problems related to breathing. He has a pending surgery for cholelithiasis. Although he seems to be doing fairly well at this exam, pulmonary consultation is requested prior to his pending surgery.  2. Anemia in chronic renal disease Chronic with  hemoglobin ranging about 9.5 g percent  3. Edema Trace to 1+ edema in both lower legs and feet  4. Essential hypertension Well controlled on current medication - amLODipine (NORVASC) 10 MG tablet; Take 1 tablet (10 mg total) by mouth daily.  Dispense: 90 tablet; Refill: 1 - carvedilol (COREG) 25 MG tablet; Take 1 tablet (25 mg total) by mouth 2 (two) times daily. Take one tablet in the morning and one tablet in the evening  Dispense: 180 tablet; Refill: 1 - doxazosin (CARDURA) 4 MG tablet; Take 0.5 tablet  by mouth in the am and 0.5 tablet by mouth in the pm to regulate blood pressure.  Dispense: 90 tablet; Refill: 1  5. Asthma, moderate persistent, uncomplicated No audible wheezing today, but he is short of breath with exertion. - Ambulatory referral to Pulmonology  6. Calculus of gallbladder without cholecystitis without obstruction Continues the right upper quadrant discomfort intermittently. - oxyCODONE-acetaminophen (PERCOCET/ROXICET) 5-325 MG per tablet; Take 1-2 tablets by mouth every 8 (eight) hours as needed for severe pain.  Dispense: 14 tablet; Refill: 0  7. Chronic obstructive pulmonary disease, unspecified COPD, unspecified chronic bronchitis type Pulmonary consultation requested prior to pending surgery  8. ESRD on dialysis Continue dialysis - b complex-vitamin c-folic acid (NEPHRO-VITE) 0.8 MG TABS tablet; Take 1 tablet by mouth daily.  Dispense: 90 tablet; Refill: 3  9. HLD (hyperlipidemia) Continue pravastatin - pravastatin (PRAVACHOL) 40 MG tablet; Take 1 tablet (40 mg total) by mouth every evening.  Dispense: 90 tablet; Refill: 1

## 2014-10-06 DIAGNOSIS — N2581 Secondary hyperparathyroidism of renal origin: Secondary | ICD-10-CM | POA: Diagnosis not present

## 2014-10-06 DIAGNOSIS — Z23 Encounter for immunization: Secondary | ICD-10-CM | POA: Diagnosis not present

## 2014-10-06 DIAGNOSIS — N186 End stage renal disease: Secondary | ICD-10-CM | POA: Diagnosis not present

## 2014-10-06 DIAGNOSIS — E089 Diabetes mellitus due to underlying condition without complications: Secondary | ICD-10-CM | POA: Diagnosis not present

## 2014-10-06 DIAGNOSIS — D631 Anemia in chronic kidney disease: Secondary | ICD-10-CM | POA: Diagnosis not present

## 2014-10-08 DIAGNOSIS — E089 Diabetes mellitus due to underlying condition without complications: Secondary | ICD-10-CM | POA: Diagnosis not present

## 2014-10-08 DIAGNOSIS — Z23 Encounter for immunization: Secondary | ICD-10-CM | POA: Diagnosis not present

## 2014-10-08 DIAGNOSIS — N2581 Secondary hyperparathyroidism of renal origin: Secondary | ICD-10-CM | POA: Diagnosis not present

## 2014-10-08 DIAGNOSIS — N186 End stage renal disease: Secondary | ICD-10-CM | POA: Diagnosis not present

## 2014-10-08 DIAGNOSIS — D631 Anemia in chronic kidney disease: Secondary | ICD-10-CM | POA: Diagnosis not present

## 2014-10-11 DIAGNOSIS — Z23 Encounter for immunization: Secondary | ICD-10-CM | POA: Diagnosis not present

## 2014-10-11 DIAGNOSIS — E089 Diabetes mellitus due to underlying condition without complications: Secondary | ICD-10-CM | POA: Diagnosis not present

## 2014-10-11 DIAGNOSIS — D631 Anemia in chronic kidney disease: Secondary | ICD-10-CM | POA: Diagnosis not present

## 2014-10-11 DIAGNOSIS — N2581 Secondary hyperparathyroidism of renal origin: Secondary | ICD-10-CM | POA: Diagnosis not present

## 2014-10-11 DIAGNOSIS — N186 End stage renal disease: Secondary | ICD-10-CM | POA: Diagnosis not present

## 2014-10-13 ENCOUNTER — Ambulatory Visit: Payer: Medicare Other | Admitting: Internal Medicine

## 2014-10-13 DIAGNOSIS — N186 End stage renal disease: Secondary | ICD-10-CM | POA: Diagnosis not present

## 2014-10-13 DIAGNOSIS — E089 Diabetes mellitus due to underlying condition without complications: Secondary | ICD-10-CM | POA: Diagnosis not present

## 2014-10-13 DIAGNOSIS — D631 Anemia in chronic kidney disease: Secondary | ICD-10-CM | POA: Diagnosis not present

## 2014-10-13 DIAGNOSIS — Z23 Encounter for immunization: Secondary | ICD-10-CM | POA: Diagnosis not present

## 2014-10-13 DIAGNOSIS — N2581 Secondary hyperparathyroidism of renal origin: Secondary | ICD-10-CM | POA: Diagnosis not present

## 2014-10-15 ENCOUNTER — Other Ambulatory Visit: Payer: Self-pay | Admitting: General Surgery

## 2014-10-15 DIAGNOSIS — N186 End stage renal disease: Secondary | ICD-10-CM | POA: Diagnosis not present

## 2014-10-15 DIAGNOSIS — N2581 Secondary hyperparathyroidism of renal origin: Secondary | ICD-10-CM | POA: Diagnosis not present

## 2014-10-15 DIAGNOSIS — E089 Diabetes mellitus due to underlying condition without complications: Secondary | ICD-10-CM | POA: Diagnosis not present

## 2014-10-15 DIAGNOSIS — D631 Anemia in chronic kidney disease: Secondary | ICD-10-CM | POA: Diagnosis not present

## 2014-10-15 DIAGNOSIS — Z23 Encounter for immunization: Secondary | ICD-10-CM | POA: Diagnosis not present

## 2014-10-18 ENCOUNTER — Encounter: Payer: Self-pay | Admitting: Internal Medicine

## 2014-10-18 ENCOUNTER — Ambulatory Visit (INDEPENDENT_AMBULATORY_CARE_PROVIDER_SITE_OTHER): Payer: Medicare Other | Admitting: Internal Medicine

## 2014-10-18 VITALS — BP 142/76 | HR 83 | Ht 67.5 in | Wt 233.8 lb

## 2014-10-18 DIAGNOSIS — R0602 Shortness of breath: Secondary | ICD-10-CM

## 2014-10-18 DIAGNOSIS — R0600 Dyspnea, unspecified: Secondary | ICD-10-CM

## 2014-10-18 DIAGNOSIS — N2581 Secondary hyperparathyroidism of renal origin: Secondary | ICD-10-CM | POA: Diagnosis not present

## 2014-10-18 DIAGNOSIS — Z23 Encounter for immunization: Secondary | ICD-10-CM | POA: Diagnosis not present

## 2014-10-18 DIAGNOSIS — E089 Diabetes mellitus due to underlying condition without complications: Secondary | ICD-10-CM | POA: Diagnosis not present

## 2014-10-18 DIAGNOSIS — N186 End stage renal disease: Secondary | ICD-10-CM | POA: Diagnosis not present

## 2014-10-18 DIAGNOSIS — J4530 Mild persistent asthma, uncomplicated: Secondary | ICD-10-CM | POA: Diagnosis not present

## 2014-10-18 DIAGNOSIS — D631 Anemia in chronic kidney disease: Secondary | ICD-10-CM | POA: Diagnosis not present

## 2014-10-18 NOTE — Assessment & Plan Note (Addendum)
-    10/18/2014  Walked RA x 3 laps @ 185 ft each stopped due to End of study, nl pace, no desat, min sob  - spirometry 10/18/2014 neg for obst    I had an extended final summary discussion with the patient reviewing all relevant studies completed to date and  lasting  24 m    I reviewed the Fletcher curve with the patient that basically indicates  if you quit smoking when your best day FEV1 is still well preserved (as is clearly  the case here)  it is highly unlikely you will progress to severe disease and informed the patient there was no medication on the market that has proven to alter the curve/ its downward trajectory  or the likelihood of progression of their disease.  Therefore stopping smoking and maintaining abstinence is the most important aspect of care, not choice of inhalers or for that matter, doctors.    His main problem is obesity/ deconditioning, though could have a mild asthma component, I believe that was mostly smoking related and may not recur off cigs, so symbicort can probably be stopped post op  No contraindication to surgery but there is risk assoc with obesity > see AVS  Each maintenance medication was reviewed in detail including most importantly the difference between maintenance and as needed and under what circumstances the prns are to be used.  Please see instructions for details which were reviewed in writing and the patient given a copy.    Pulmonary f/u is prn

## 2014-10-18 NOTE — Assessment & Plan Note (Signed)
10/18/2014  extensive coaching HFA effectiveness =    90%  If he does have asthma, All goals of chronic asthma control met including optimal function and elimination of symptoms with minimal need for rescue therapy.  Contingencies discussed in full including contacting this office immediately if not controlling the symptoms using the rule of two's.

## 2014-10-18 NOTE — Progress Notes (Signed)
   Subjective:    Patient ID: Kirk [name], male    DOB: 02-Aug-1960     MRN: [id number]  HPI  73 yobm quit smoking Aug 2016 referred 10/18/2014 to [place] for eval of ? Copd for clearance for lap choele with  no airflow obst on spiromtery   10/18/2014    10/18/2014 1st [facility] office visit/ [place]   Chief Complaint  Patient presents with  . Advice Only    refer Dr. Nyoka Cowden. Denies any wheezing, no chest tx, no cough. Pt here to be evaluated for COPD.    variable sob with exertion - says can walk fine but no regular walking,  Some sob steps  No noct symptoms at all/ cough resolved when quit smoking  No obvious day to day or daytime variability or assoc   cp or chest tightness, subjective wheeze or overt sinus or hb symptoms. No unusual exp hx or h/o childhood pna/ asthma or knowledge of premature birth.  Sleeping ok without nocturnal  or early am exacerbation  of respiratory  c/o's or need for noct saba. Also denies any obvious fluctuation of symptoms with weather or environmental changes or other aggravating or alleviating factors except as outlined above   Current Medications, Allergies, Complete Past Medical History, Past Surgical History, Family History, and Social History were reviewed in [employer] record.     Review of Systems  Constitutional: Negative for fever, chills, activity change, appetite change and unexpected weight change.  HENT: Negative for congestion, dental problem, postnasal drip, rhinorrhea, sneezing, sore throat, trouble swallowing and voice change.   Eyes: Negative for visual disturbance.  Respiratory: Positive for shortness of breath. Negative for cough and choking.   Cardiovascular: Positive for chest pain. Negative for leg swelling.  Gastrointestinal: Negative for nausea, vomiting and abdominal pain.  Genitourinary: Negative for difficulty urinating.  Musculoskeletal: Negative for arthralgias.  Skin: Negative for rash.    Psychiatric/Behavioral: Negative for behavioral problems and confusion.       Objective:   Physical Exam  Wt Readings from Last 3 Encounters:  10/18/14 233 lb 12.8 oz (106.051 kg)  10/05/14 235 lb 3.2 oz (106.686 kg)  09/20/14 230 lb 6.1 oz (104.5 kg)    Vital signs reviewed  HEENT: nl dentition, turbinates, and orophanx. Nl external ear canals without cough reflex   NECK :  without JVD/Nodes/TM/ nl carotid upstrokes bilaterally   LUNGS: no acc muscle use, clear to A and P bilaterally without cough on insp or exp maneuvers   CV:  RRR  no s3 or murmur or increase in P2, no edema   ABD:  Quite obese/ soft and nontender with nl excursion in the supine position. No bruits or organomegaly, bowel sounds nl  MS:  warm without deformities, calf tenderness, cyanosis or clubbing  SKIN: warm and dry without lesions    NEURO:  alert, approp, no deficits    I personally reviewed images and agree with radiology impression as follows:    CTa 09/18/14 No evidence of pulmonary embolism.  Subtle stable bilateral nodularity as described likely sequelae from previous infectious/inflammatory insult           Assessment & Plan:

## 2014-10-18 NOTE — Patient Instructions (Addendum)
Plan A = Backup=  symbiocrt 80 Take 2 puffs first thing in am and then another 2 puffs about 12 hours later.   Plan B = Back up Only use your combivent  as a rescue medication to be used if you can't catch your breath by resting or doing a relaxed purse lip breathing pattern.  - The less you use it, the better it will work when you need it. - Ok to use up to 1 puffs  every 4 hours if you must but call for immediate appointment if use goes up over your usual need - Don't leave home without it !!  (think of it like the spare tire for your car)   Plan C = crisis Only use your nebulizer if you try B first and doesn't work  You are cleared for surgery but will need to make every effort to get out of bed asap post op, use incentive spirometry and minimize narcotics to minimize respiratory complications    Your main problem is your weight and conditioning holding you back, not your breathing   If you are satisfied with your treatment plan,  let your doctor know and he/she can either refill your medications or you can return here when your prescription runs out.     If in any way you are not 100% satisfied,  please tell us.  If 100% better, tell your friends!  Pulmonary follow up is as needed

## 2014-10-19 ENCOUNTER — Encounter (HOSPITAL_COMMUNITY)
Admission: RE | Admit: 2014-10-19 | Discharge: 2014-10-19 | Disposition: A | Discharge status: Home / Self Care | Payer: Medicare Other | Source: Ambulatory Visit | Attending: General Surgery | Admitting: General Surgery

## 2014-10-19 ENCOUNTER — Encounter (HOSPITAL_COMMUNITY): Payer: Self-pay

## 2014-10-19 DIAGNOSIS — I120 Hypertensive chronic kidney disease with stage 5 chronic kidney disease or end stage renal disease: Secondary | ICD-10-CM | POA: Diagnosis not present

## 2014-10-19 DIAGNOSIS — J449 Chronic obstructive pulmonary disease, unspecified: Secondary | ICD-10-CM | POA: Diagnosis not present

## 2014-10-19 DIAGNOSIS — N186 End stage renal disease: Secondary | ICD-10-CM | POA: Diagnosis not present

## 2014-10-19 DIAGNOSIS — Z992 Dependence on renal dialysis: Secondary | ICD-10-CM | POA: Diagnosis not present

## 2014-10-19 DIAGNOSIS — K8010 Calculus of gallbladder with chronic cholecystitis without obstruction: Secondary | ICD-10-CM | POA: Diagnosis not present

## 2014-10-19 HISTORY — DX: Unspecified asthma, uncomplicated: J45.909

## 2014-10-19 LAB — HEPATIC FUNCTION PANEL
ALT: 14 U/L — AB (ref 17–63)
AST: 17 U/L (ref 15–41)
Albumin: 3.3 g/dL — ABNORMAL LOW (ref 3.5–5.0)
Alkaline Phosphatase: 55 U/L (ref 38–126)
Bilirubin, Direct: <0.1 mg/dL — ABNORMAL LOW (ref 0.1–0.5)
TOTAL PROTEIN: 6.6 g/dL (ref 6.5–8.1)
Total Bilirubin: 0.4 mg/dL (ref 0.3–1.2)

## 2014-10-19 LAB — CBC
HCT: 24.9 % — ABNORMAL LOW (ref 39.0–52.0)
Hemoglobin: 8.4 g/dL — ABNORMAL LOW (ref 13.0–17.0)
MCH: 32.6 pg (ref 26.0–34.0)
MCHC: 33.7 g/dL (ref 30.0–36.0)
MCV: 96.5 fL (ref 78.0–100.0)
PLATELETS: 116 10*3/uL — AB (ref 150–400)
RBC: 2.58 MIL/uL — AB (ref 4.22–5.81)
RDW: 14.2 % (ref 11.5–15.5)
WBC: 3.3 10*3/uL — ABNORMAL LOW (ref 4.0–10.5)

## 2014-10-19 LAB — BASIC METABOLIC PANEL
Anion gap: 13 (ref 5–15)
BUN: 44 mg/dL — AB (ref 6–20)
CO2: 27 mmol/L (ref 22–32)
CREATININE: 12.04 mg/dL — AB (ref 0.61–1.24)
Calcium: 8 mg/dL — ABNORMAL LOW (ref 8.9–10.3)
Chloride: 97 mmol/L — ABNORMAL LOW (ref 101–111)
GFR calc Af Amer: 5 mL/min — ABNORMAL LOW (ref >60)
GFR, EST NON AFRICAN AMERICAN: 4 mL/min — AB (ref >60)
Glucose, Bld: 95 mg/dL (ref 65–99)
POTASSIUM: 3.9 mmol/L (ref 3.5–5.1)
SODIUM: 137 mmol/L (ref 135–145)

## 2014-10-19 NOTE — Progress Notes (Signed)
Pt denies any acute cardiopulmonary issues. Pt chart forwarded to [place], [place], anesthesia, to review cardiac history , abnormal labs ( creatinine, hemoglobin, HCT) and pulmonary note.

## 2014-10-19 NOTE — Pre-Procedure Instructions (Signed)
Kirk [name]  10/19/2014      [facility] [place] [address] [place] [address] Phone: [phone] Fax: [phone]  [id number] [place], [place] AT [facility] [place] [address] Phone: [phone] Fax: [phone]    Your procedure is scheduled on Thursday, October 21, 2014  Report to [id number] Admitting at 5:30 A.M.  Call this number if you have problems the morning of surgery:  [phone]   Remember:  Do not eat food or drink liquids after midnight Wednesday, October 20, 2014  Take these medicines the morning of surgery with A SIP OF WATER:  amLODipine (NORVASC), carvedilol (COREG), doxazosin (CARDURA), budesonide-formoterol (SYMBICORT)  Inhaler, albuterol-ipratropium (COMBIVENT)  inhaler if needed: pain medication, promethazine (PHENERGAN) for  Nausea or vomiting, Albuterol Nebulizer for wheezing,  Stop taking Aspirin,vitamins and herbal medications. Do not take any NSAIDs ie: Ibuprofen, Advil, Naproxen or any medication containing Aspirin; stop now.   Do not wear jewelry, make-up or nail polish.  Do not wear lotions, powders, or perfumes.  You may not wear deodorant.  Do not shave 48 hours prior to surgery.  Men may shave face and neck.  Do not bring valuables to the hospital.  [facility] is not responsible for any belongings or valuables.  Contacts, dentures or bridgework may not be worn into surgery.  Leave your suitcase in the car.  After surgery it may be brought to your room.  For patients admitted to the hospital, discharge time will be determined by your treatment team.  Patients discharged the day of surgery will not be allowed to drive home.   Name and phone number of your driver:    Special instructions: Special Instructions:Special Instructions: [facility] - Preparing for Surgery  Before  surgery, you can play an important role.  Because skin is not sterile, your skin needs to be as free of germs as possible.  You can reduce the number of germs on you skin by washing with CHG (chlorahexidine gluconate) soap before surgery.  CHG is an antiseptic cleaner which kills germs and bonds with the skin to continue killing germs even after washing.  Please DO NOT use if you have an allergy to CHG or antibacterial soaps.  If your skin becomes reddened/irritated stop using the CHG and inform your nurse when you arrive at Short Stay.  Do not shave (including legs and underarms) for at least 48 hours prior to the first CHG shower.  You may shave your face.  Please follow these instructions carefully:   1.  Shower with CHG Soap the night before surgery and the morning of Surgery.  2.  If you choose to wash your hair, wash your hair first as usual with your normal shampoo.  3.  After you shampoo, rinse your hair and body thoroughly to remove the Shampoo.  4.  Use CHG as you would any other liquid soap.  You can apply chg directly  to the skin and wash gently with scrungie or a clean washcloth.  5.  Apply the CHG Soap to your body ONLY FROM THE NECK DOWN.  Do not use on open wounds or open sores.  Avoid contact with your eyes, ears, mouth and genitals (private parts).  Wash genitals (private parts) with your normal soap.  6.  Wash thoroughly, paying special attention to the area  where your surgery will be performed.  7.  Thoroughly rinse your body with warm water from the neck down.  8.  DO NOT shower/wash with your normal soap after using and rinsing off the CHG Soap.  9.  Pat yourself dry with a clean towel.            10.  Wear clean pajamas.            11.  Place clean sheets on your bed the night of your first shower and do not sleep with pets.  Day of Surgery  Do not apply any lotions/deodorants the morning of surgery.  Please wear clean clothes to the hospital/surgery center.  Please  read over the following fact sheets that you were given. Pain Booklet, Coughing and Deep Breathing and Surgical Site Infection Prevention

## 2014-10-20 DIAGNOSIS — E089 Diabetes mellitus due to underlying condition without complications: Secondary | ICD-10-CM | POA: Diagnosis not present

## 2014-10-20 DIAGNOSIS — Z23 Encounter for immunization: Secondary | ICD-10-CM | POA: Diagnosis not present

## 2014-10-20 DIAGNOSIS — N2581 Secondary hyperparathyroidism of renal origin: Secondary | ICD-10-CM | POA: Diagnosis not present

## 2014-10-20 DIAGNOSIS — D631 Anemia in chronic kidney disease: Secondary | ICD-10-CM | POA: Diagnosis not present

## 2014-10-20 DIAGNOSIS — N186 End stage renal disease: Secondary | ICD-10-CM | POA: Diagnosis not present

## 2014-10-20 MED ORDER — CEFAZOLIN SODIUM-DEXTROSE 2-3 GM-% IV SOLR
2.0000 g | INTRAVENOUS | Status: AC
  Administered 2014-10-21: 2 g via INTRAVENOUS
  Filled 2014-10-20: qty 50

## 2014-10-20 MED ORDER — CHLORHEXIDINE GLUCONATE 4 % EX LIQD: 1.0000 "application " | Freq: Once | CUTANEOUS | Status: DC

## 2014-10-20 MED ORDER — CHLORHEXIDINE GLUCONATE 4 % EX LIQD
1.0000 | Freq: Once | CUTANEOUS | Status: DC
Start: 2014-10-21 — End: 2014-10-21

## 2014-10-20 NOTE — Progress Notes (Signed)
Anesthesia Chart Review:  Patient is a 54 year old male scheduled for laparoscopic cholecystectomy on 10/21/14 by Dr. Marlou Starks. Patient was initially scheduled for 09/23/14 but was postponed due to hospitalization for COPD exacerbation/SOB.    Patient has since had follow-up with his PCP Dr. Jeanmarie Hubert and was seen by pulmonologist Dr. Christinia Gully preoperative evaluation on 10/18/14. He felt patient's "main problem is obesity/ deconditioning, though could have a mild asthma component, I believe that was mostly smoking related and may not recur off cigs, so symbicort can probably be stopped post op." He felt there was no contraindication to surgery but there is risk associated with obesity (see AVS). Smoking cessation and PRN pulmonary follow-up recommended.  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 Epilepsy is in his history but he denied. Admission for COPD exacerbation/SOB 09/09/14-09/12/14 and 09/18/14-09/21/14.  Meds include albuterol, Combivent, amlodipine, ASA, Symbicort, calcitriol, Coreg, doxazosin, pravastatin, Percocet, promethazine, tramadol.   09/20/14 Echo: Left ventricle: The cavity size was normal. Systolic function was normal. The estimated ejection fraction was in the range of 55% to 60%. Wall motion was normal; there were no regional wall motion abnormalities. Impressions: Compared to the prior study, there has been no significantinterval change.  04/23/13 Nuclear stress test ([facility], Care Everywhere): FINAL COMMENTS 1. Negative for regadenoson induced ischemia. 2. Mild fixed inferior region compatible with diaphragm attenuation artifact. 3. Normal LV wall motion,  and normal LVEF.  09/18/14 EKG: NSR, minimal voltage criteria for LVH, may be normal variant, prolonged QT.  10/18/2014 Walked RA x 3 laps @ 185 ft each stopped due to End of study, nl pace, no desat, min sob   10/18/14 Spirometry: FVC 2.26 (50%), FEV1 1.59 (44%), FEV1/FVC 71% (88%), FEF25-75% 1.07 (29%). Interpretation: Severer airway obstruction, with low vital capacity.    09/18/14 Chest CTA: IMPRESSION: 1. No evidence of pulmonary embolism. 2. Subtle stable bilateral nodularity as described likely sequelae from previous infectious/inflammatory insult. Recommend follow-up noncontrast chest CT 1 year.  09/18/14 CXR: IMPRESSION: Mild vascular congestion.   06/22/10 Carotid duplex: IMPRESSION: No evidence for carotid artery stenosis.  Preoperative labs noted. H/H 8.4/24.9, PLT count 116. K 3.9, Cr 12.04. He has known ESRD on hemodialysis MWF. Previous HGB since 05/2014 8.5-11.6, but primarily in the 9 range. H/H routed to Dr. Marlou Starks yesterday. I also called result to [facility] triage nurse Raquel Sarna this morning.  Patient will get an ISTAT on arrival. I'll also order a hold clot, so if surgeon and/or anesthesiologist can add a T&S/T&C if needed based on results.   George Hugh [facility] Short Stay Center/Anesthesiology Phone [phone] 10/20/2014 11:14 AM

## 2014-10-21 ENCOUNTER — Ambulatory Visit (HOSPITAL_COMMUNITY): Payer: Medicare Other | Admitting: Anesthesiology

## 2014-10-21 ENCOUNTER — Ambulatory Visit (HOSPITAL_COMMUNITY): Payer: Medicare Other

## 2014-10-21 ENCOUNTER — Encounter (HOSPITAL_COMMUNITY): Payer: Self-pay | Admitting: *Deleted

## 2014-10-21 ENCOUNTER — Ambulatory Visit (HOSPITAL_COMMUNITY)
Admission: RE | Admit: 2014-10-21 | Discharge: 2014-10-22 | Disposition: A | Discharge status: Home / Self Care | Payer: Medicare Other | Source: Ambulatory Visit | Attending: General Surgery | Admitting: General Surgery

## 2014-10-21 ENCOUNTER — Ambulatory Visit (HOSPITAL_COMMUNITY): Payer: Medicare Other | Admitting: Vascular Surgery

## 2014-10-21 ENCOUNTER — Encounter (HOSPITAL_COMMUNITY)
Admission: RE | Disposition: A | Discharge status: Home / Self Care | Payer: Self-pay | Source: Ambulatory Visit | Attending: General Surgery

## 2014-10-21 DIAGNOSIS — K8010 Calculus of gallbladder with chronic cholecystitis without obstruction: Secondary | ICD-10-CM | POA: Diagnosis not present

## 2014-10-21 DIAGNOSIS — I120 Hypertensive chronic kidney disease with stage 5 chronic kidney disease or end stage renal disease: Secondary | ICD-10-CM | POA: Insufficient documentation

## 2014-10-21 DIAGNOSIS — R0602 Shortness of breath: Secondary | ICD-10-CM | POA: Diagnosis not present

## 2014-10-21 DIAGNOSIS — M545 Low back pain: Secondary | ICD-10-CM | POA: Diagnosis not present

## 2014-10-21 DIAGNOSIS — K824 Cholesterolosis of gallbladder: Secondary | ICD-10-CM | POA: Diagnosis not present

## 2014-10-21 DIAGNOSIS — R5381 Other malaise: Secondary | ICD-10-CM | POA: Diagnosis not present

## 2014-10-21 DIAGNOSIS — Z992 Dependence on renal dialysis: Secondary | ICD-10-CM | POA: Diagnosis not present

## 2014-10-21 DIAGNOSIS — K8020 Calculus of gallbladder without cholecystitis without obstruction: Secondary | ICD-10-CM | POA: Diagnosis present

## 2014-10-21 DIAGNOSIS — N186 End stage renal disease: Secondary | ICD-10-CM | POA: Diagnosis not present

## 2014-10-21 DIAGNOSIS — J449 Chronic obstructive pulmonary disease, unspecified: Secondary | ICD-10-CM | POA: Insufficient documentation

## 2014-10-21 HISTORY — DX: Obstructive sleep apnea (adult) (pediatric): G47.33

## 2014-10-21 HISTORY — PX: LAPAROSCOPIC CHOLECYSTECTOMY: SUR755

## 2014-10-21 HISTORY — DX: End stage renal disease: N18.6

## 2014-10-21 HISTORY — PX: CHOLECYSTECTOMY: SHX55

## 2014-10-21 HISTORY — DX: Obstructive sleep apnea (adult) (pediatric): Z99.89

## 2014-10-21 HISTORY — DX: End stage renal disease: Z99.2

## 2014-10-21 LAB — POCT I-STAT 4, (NA,K, GLUC, HGB,HCT)
Glucose, Bld: 127 mg/dL — ABNORMAL HIGH (ref 65–99)
HCT: 25 % — ABNORMAL LOW (ref 39.0–52.0)
Hemoglobin: 8.5 g/dL — ABNORMAL LOW (ref 13.0–17.0)
Potassium: 3.7 mmol/L (ref 3.5–5.1)
Sodium: 140 mmol/L (ref 135–145)

## 2014-10-21 LAB — SAMPLE TO BLOOD BANK

## 2014-10-21 SURGERY — LAPAROSCOPIC CHOLECYSTECTOMY WITH INTRAOPERATIVE CHOLANGIOGRAM
Anesthesia: General | Site: Abdomen

## 2014-10-21 MED ORDER — ROCURONIUM BROMIDE 50 MG/5ML IV SOLN
INTRAVENOUS | Status: AC
  Filled 2014-10-21: qty 1

## 2014-10-21 MED ORDER — 0.9 % SODIUM CHLORIDE (POUR BTL) OPTIME
TOPICAL | Status: DC | PRN
  Administered 2014-10-21: 1000 mL

## 2014-10-21 MED ORDER — FENTANYL CITRATE (PF) 250 MCG/5ML IJ SOLN
INTRAMUSCULAR | Status: AC
  Filled 2014-10-21: qty 5

## 2014-10-21 MED ORDER — EPHEDRINE SULFATE 50 MG/ML IJ SOLN
INTRAMUSCULAR | Status: AC
  Filled 2014-10-21: qty 1

## 2014-10-21 MED ORDER — ONDANSETRON HCL 4 MG/2ML IJ SOLN
INTRAMUSCULAR | Status: AC
  Filled 2014-10-21: qty 2

## 2014-10-21 MED ORDER — CALCIUM ACETATE (PHOS BINDER) 667 MG/5ML PO SOLN
667.0000 mg | ORAL | Status: DC | PRN
  Filled 2014-10-21: qty 5

## 2014-10-21 MED ORDER — EPHEDRINE SULFATE 50 MG/ML IJ SOLN
INTRAMUSCULAR | Status: DC | PRN
  Administered 2014-10-21: 10 mg via INTRAVENOUS
  Administered 2014-10-21: 5 mg via INTRAVENOUS
  Administered 2014-10-21: 10 mg via INTRAVENOUS

## 2014-10-21 MED ORDER — MIDAZOLAM HCL 2 MG/2ML IJ SOLN
INTRAMUSCULAR | Status: AC
  Filled 2014-10-21: qty 4

## 2014-10-21 MED ORDER — CALCITRIOL 0.25 MCG PO CAPS
0.2500 ug | ORAL_CAPSULE | ORAL | Status: DC
  Filled 2014-10-21: qty 1

## 2014-10-21 MED ORDER — SODIUM CHLORIDE 0.9 % IV SOLN
INTRAVENOUS | Status: DC | PRN
  Administered 2014-10-21: 8 mL

## 2014-10-21 MED ORDER — GLYCOPYRROLATE 0.2 MG/ML IJ SOLN
INTRAMUSCULAR | Status: AC
  Filled 2014-10-21: qty 4

## 2014-10-21 MED ORDER — MORPHINE SULFATE (PF) 2 MG/ML IV SOLN: 1.0000 mg | INTRAVENOUS | Status: DC | PRN

## 2014-10-21 MED ORDER — GUAIFENESIN-DM 100-10 MG/5ML PO SYRP: 10.0000 mL | ORAL_SOLUTION | ORAL | Status: DC | PRN

## 2014-10-21 MED ORDER — CARVEDILOL 25 MG PO TABS
25.0000 mg | ORAL_TABLET | Freq: Once | ORAL | Status: DC
  Filled 2014-10-21: qty 1

## 2014-10-21 MED ORDER — OXYCODONE-ACETAMINOPHEN 5-325 MG PO TABS
ORAL_TABLET | ORAL | Status: AC
  Filled 2014-10-21: qty 2

## 2014-10-21 MED ORDER — SUCCINYLCHOLINE CHLORIDE 20 MG/ML IJ SOLN
INTRAMUSCULAR | Status: DC | PRN
  Administered 2014-10-21: 120 mg via INTRAVENOUS

## 2014-10-21 MED ORDER — ALBUTEROL SULFATE (2.5 MG/3ML) 0.083% IN NEBU: 2.5000 mg | INHALATION_SOLUTION | Freq: Four times a day (QID) | RESPIRATORY_TRACT | Status: DC | PRN

## 2014-10-21 MED ORDER — GLYCOPYRROLATE 0.2 MG/ML IJ SOLN
INTRAMUSCULAR | Status: DC | PRN
  Administered 2014-10-21: .8 mg via INTRAVENOUS

## 2014-10-21 MED ORDER — PROMETHAZINE HCL 25 MG PO TABS: 25.0000 mg | ORAL_TABLET | Freq: Three times a day (TID) | ORAL | Status: DC | PRN

## 2014-10-21 MED ORDER — CARVEDILOL 25 MG PO TABS
25.0000 mg | ORAL_TABLET | Freq: Two times a day (BID) | ORAL | Status: DC
  Administered 2014-10-21 – 2014-10-22 (×2): 25 mg via ORAL
  Filled 2014-10-21 (×2): qty 1

## 2014-10-21 MED ORDER — PROPOFOL 10 MG/ML IV BOLUS
INTRAVENOUS | Status: DC | PRN
  Administered 2014-10-21: 150 mg via INTRAVENOUS
  Administered 2014-10-21: 30 mg via INTRAVENOUS

## 2014-10-21 MED ORDER — FENTANYL CITRATE (PF) 100 MCG/2ML IJ SOLN
INTRAMUSCULAR | Status: DC | PRN
  Administered 2014-10-21 (×3): 50 ug via INTRAVENOUS

## 2014-10-21 MED ORDER — PROMETHAZINE HCL 25 MG/ML IJ SOLN: 6.2500 mg | INTRAMUSCULAR | Status: DC | PRN

## 2014-10-21 MED ORDER — CALCIUM ACETATE (PHOS BINDER) 667 MG/5ML PO SOLN
2001.0000 mg | Freq: Three times a day (TID) | ORAL | Status: DC
  Administered 2014-10-21: 2001 mg via ORAL
  Filled 2014-10-21 (×6): qty 15

## 2014-10-21 MED ORDER — PROPOFOL 10 MG/ML IV BOLUS
INTRAVENOUS | Status: AC
  Filled 2014-10-21: qty 20

## 2014-10-21 MED ORDER — NEOSTIGMINE METHYLSULFATE 10 MG/10ML IV SOLN
INTRAVENOUS | Status: DC | PRN
  Administered 2014-10-21: 5 mg via INTRAVENOUS

## 2014-10-21 MED ORDER — HYDROMORPHONE HCL 1 MG/ML IJ SOLN
INTRAMUSCULAR | Status: AC
  Administered 2014-10-21: 0.5 mg via INTRAVENOUS
  Filled 2014-10-21: qty 1

## 2014-10-21 MED ORDER — SODIUM CHLORIDE 0.9 % IV SOLN
INTRAVENOUS | Status: DC
  Administered 2014-10-21: 16:00:00 via INTRAVENOUS

## 2014-10-21 MED ORDER — ROCURONIUM BROMIDE 100 MG/10ML IV SOLN
INTRAVENOUS | Status: DC | PRN
  Administered 2014-10-21: 30 mg via INTRAVENOUS

## 2014-10-21 MED ORDER — SODIUM CHLORIDE 0.9 % IV SOLN
INTRAVENOUS | Status: DC | PRN
  Administered 2014-10-21: 07:00:00 via INTRAVENOUS

## 2014-10-21 MED ORDER — DIPHENHYDRAMINE HCL 25 MG PO CAPS
50.0000 mg | ORAL_CAPSULE | Freq: Two times a day (BID) | ORAL | Status: DC
  Administered 2014-10-21 – 2014-10-22 (×3): 50 mg via ORAL
  Filled 2014-10-21 (×5): qty 2

## 2014-10-21 MED ORDER — LIDOCAINE HCL (CARDIAC) 20 MG/ML IV SOLN
INTRAVENOUS | Status: DC | PRN
  Administered 2014-10-21: 80 mg via INTRAVENOUS

## 2014-10-21 MED ORDER — PRAVASTATIN SODIUM 10 MG PO TABS
40.0000 mg | ORAL_TABLET | Freq: Every evening | ORAL | Status: DC
  Administered 2014-10-21: 40 mg via ORAL
  Filled 2014-10-21: qty 4

## 2014-10-21 MED ORDER — AMLODIPINE BESYLATE 10 MG PO TABS
10.0000 mg | ORAL_TABLET | Freq: Every day | ORAL | Status: DC
  Administered 2014-10-22: 10 mg via ORAL
  Filled 2014-10-21: qty 1

## 2014-10-21 MED ORDER — SODIUM CHLORIDE 0.9 % IR SOLN
Status: DC | PRN
  Administered 2014-10-21: 1000 mL

## 2014-10-21 MED ORDER — BUPIVACAINE-EPINEPHRINE 0.25% -1:200000 IJ SOLN
INTRAMUSCULAR | Status: DC | PRN
  Administered 2014-10-21: 18 mL

## 2014-10-21 MED ORDER — BUDESONIDE-FORMOTEROL FUMARATE 80-4.5 MCG/ACT IN AERO
2.0000 | INHALATION_SPRAY | Freq: Two times a day (BID) | RESPIRATORY_TRACT | Status: DC
Start: 2014-10-21 — End: 2014-10-22
  Administered 2014-10-21: 2 via RESPIRATORY_TRACT
  Filled 2014-10-21: qty 6.9

## 2014-10-21 MED ORDER — NEOSTIGMINE METHYLSULFATE 10 MG/10ML IV SOLN
INTRAVENOUS | Status: AC
  Filled 2014-10-21: qty 1

## 2014-10-21 MED ORDER — ONDANSETRON HCL 4 MG/2ML IJ SOLN
INTRAMUSCULAR | Status: DC | PRN
  Administered 2014-10-21: 4 mg via INTRAVENOUS

## 2014-10-21 MED ORDER — HYDROMORPHONE HCL 1 MG/ML IJ SOLN
0.2500 mg | INTRAMUSCULAR | Status: DC | PRN
  Administered 2014-10-21 (×2): 0.5 mg via INTRAVENOUS

## 2014-10-21 MED ORDER — LACTATED RINGERS IV SOLN: INTRAVENOUS | Status: DC | PRN

## 2014-10-21 MED ORDER — SUCCINYLCHOLINE CHLORIDE 20 MG/ML IJ SOLN
INTRAMUSCULAR | Status: AC
  Filled 2014-10-21: qty 1

## 2014-10-21 MED ORDER — STERILE WATER FOR INJECTION IJ SOLN
INTRAMUSCULAR | Status: AC
  Filled 2014-10-21: qty 10

## 2014-10-21 MED ORDER — LIDOCAINE HCL (CARDIAC) 20 MG/ML IV SOLN
INTRAVENOUS | Status: AC
  Filled 2014-10-21: qty 5

## 2014-10-21 MED ORDER — OXYCODONE-ACETAMINOPHEN 5-325 MG PO TABS
1.0000 | ORAL_TABLET | Freq: Three times a day (TID) | ORAL | Status: DC | PRN
  Administered 2014-10-21 (×2): 2 via ORAL
  Filled 2014-10-21: qty 2

## 2014-10-21 MED ORDER — DOXAZOSIN MESYLATE 2 MG PO TABS
2.0000 mg | ORAL_TABLET | Freq: Two times a day (BID) | ORAL | Status: DC
  Administered 2014-10-21 – 2014-10-22 (×3): 2 mg via ORAL
  Filled 2014-10-21 (×5): qty 1

## 2014-10-21 MED ORDER — MIDAZOLAM HCL 5 MG/5ML IJ SOLN
INTRAMUSCULAR | Status: DC | PRN
  Administered 2014-10-21 (×2): 1 mg via INTRAVENOUS

## 2014-10-21 MED ORDER — BUPIVACAINE-EPINEPHRINE (PF) 0.25% -1:200000 IJ SOLN
INTRAMUSCULAR | Status: AC
Start: 2014-10-21 — End: 2014-10-21
  Filled 2014-10-21: qty 30

## 2014-10-21 SURGICAL SUPPLY — 43 items
APPLIER CLIP 5 13 M/L LIGAMAX5 (MISCELLANEOUS) ×3
APR CLP MED LRG 5 ANG JAW (MISCELLANEOUS) ×1
BLADE SURG ROTATE 9660 (MISCELLANEOUS) IMPLANT
CANISTER SUCTION 2500CC (MISCELLANEOUS) ×3 IMPLANT
CATH REDDICK CHOLANGI 4FR 50CM (CATHETERS) ×3 IMPLANT
CHLORAPREP W/TINT 26ML (MISCELLANEOUS) ×3 IMPLANT
CLIP APPLIE 5 13 M/L LIGAMAX5 (MISCELLANEOUS) ×1 IMPLANT
COVER MAYO STAND STRL (DRAPES) ×3 IMPLANT
COVER SURGICAL LIGHT HANDLE (MISCELLANEOUS) ×3 IMPLANT
DRAPE C-ARM 42X72 X-RAY (DRAPES) ×3 IMPLANT
ELECT REM PT RETURN 9FT ADLT (ELECTROSURGICAL) ×3
ELECTRODE REM PT RTRN 9FT ADLT (ELECTROSURGICAL) ×1 IMPLANT
GLOVE BIO SURGEON STRL SZ7.5 (GLOVE) ×6 IMPLANT
GLOVE BIO SURGEON STRL SZ8 (GLOVE) ×3 IMPLANT
GLOVE BIOGEL PI IND STRL 6.5 (GLOVE) ×2 IMPLANT
GLOVE BIOGEL PI IND STRL 7.5 (GLOVE) ×1 IMPLANT
GLOVE BIOGEL PI IND STRL 8 (GLOVE) ×1 IMPLANT
GLOVE BIOGEL PI INDICATOR 6.5 (GLOVE) ×4
GLOVE BIOGEL PI INDICATOR 7.5 (GLOVE) ×2
GLOVE BIOGEL PI INDICATOR 8 (GLOVE) ×2
GLOVE SURG SS PI 6.5 STRL IVOR (GLOVE) ×3 IMPLANT
GOWN STRL REUS W/ TWL LRG LVL3 (GOWN DISPOSABLE) ×3 IMPLANT
GOWN STRL REUS W/ TWL XL LVL3 (GOWN DISPOSABLE) ×1 IMPLANT
GOWN STRL REUS W/TWL LRG LVL3 (GOWN DISPOSABLE) ×9
GOWN STRL REUS W/TWL XL LVL3 (GOWN DISPOSABLE) ×3
IV CATH 14GX2 1/4 (CATHETERS) ×3 IMPLANT
KIT BASIN OR (CUSTOM PROCEDURE TRAY) ×3 IMPLANT
KIT ROOM TURNOVER OR (KITS) ×3 IMPLANT
LIQUID BAND (GAUZE/BANDAGES/DRESSINGS) ×3 IMPLANT
NS IRRIG 1000ML POUR BTL (IV SOLUTION) ×3 IMPLANT
PAD ARMBOARD 7.5X6 YLW CONV (MISCELLANEOUS) ×3 IMPLANT
POUCH SPECIMEN RETRIEVAL 10MM (ENDOMECHANICALS) ×3 IMPLANT
SCISSORS LAP 5X35 DISP (ENDOMECHANICALS) ×3 IMPLANT
SET IRRIG TUBING LAPAROSCOPIC (IRRIGATION / IRRIGATOR) ×3 IMPLANT
SLEEVE ENDOPATH XCEL 5M (ENDOMECHANICALS) ×6 IMPLANT
SPECIMEN JAR SMALL (MISCELLANEOUS) ×3 IMPLANT
SUT MNCRL AB 4-0 PS2 18 (SUTURE) ×3 IMPLANT
TOWEL OR 17X24 6PK STRL BLUE (TOWEL DISPOSABLE) ×3 IMPLANT
TOWEL OR 17X26 10 PK STRL BLUE (TOWEL DISPOSABLE) ×3 IMPLANT
TRAY LAPAROSCOPIC MC (CUSTOM PROCEDURE TRAY) ×3 IMPLANT
TROCAR XCEL BLUNT TIP 100MML (ENDOMECHANICALS) ×3 IMPLANT
TROCAR XCEL NON-BLD 5MMX100MML (ENDOMECHANICALS) ×3 IMPLANT
TUBING INSUFFLATION (TUBING) ×3 IMPLANT

## 2014-10-21 NOTE — Transfer of Care (Signed)
Immediate Anesthesia Transfer of Care Note  Patient: Kirk [name]  Procedure(s) Performed: Procedure(s): LAPAROSCOPIC CHOLECYSTECTOMY WITH INTRAOPERATIVE CHOLANGIOGRAM (N/A)  Patient Location: PACU  Anesthesia Type:General  Level of Consciousness: awake, alert  and oriented  Airway & Oxygen Therapy: Patient Spontanous Breathing and Patient connected to face mask oxygen  Post-op Assessment: Report given to RN, Post -op Vital signs reviewed and stable and Patient moving all extremities X 4  Post vital signs: Reviewed and stable  Last Vitals:  Filed Vitals:   10/21/14 0628  BP: 147/67  Pulse: 79  Temp: 36.8 C  Resp: 20    Complications: No apparent anesthesia complications

## 2014-10-21 NOTE — Interval H&P Note (Signed)
History and Physical Interval Note:  10/21/2014 7:16 AM  Kirk [name]  has presented today for surgery, with the diagnosis of Gallstones  The various methods of treatment have been discussed with the patient and family. After consideration of risks, benefits and other options for treatment, the patient has consented to  Procedure(s): LAPAROSCOPIC CHOLECYSTECTOMY WITH INTRAOPERATIVE CHOLANGIOGRAM (N/A) as a surgical intervention .  The patient's history has been reviewed, patient examined, no change in status, stable for surgery.  I have reviewed the patient's chart and labs.  Questions were answered to the patient's satisfaction.     TOTH III,PAUL S

## 2014-10-21 NOTE — Anesthesia Preprocedure Evaluation (Addendum)
Anesthesia Evaluation  Patient identified by MRN, date of birth, ID band Patient awake    Reviewed: Allergy & Precautions, NPO status , Patient's Chart, lab work & pertinent test results  History of Anesthesia Complications (+) history of anesthetic complications  Airway Mallampati: II  TM Distance: >3 FB Neck ROM: Full    Dental  (+) Poor Dentition, Dental Advisory Given   Pulmonary asthma , sleep apnea , COPD, former smoker,    Pulmonary exam normal        Cardiovascular hypertension, Pt. on medications and Pt. on home beta blockers Normal cardiovascular exam+ dysrhythmias   Study Conclusions  - Left ventricle: The cavity size was normal. Systolic function was normal. The estimated ejection fraction was in the range of 55% to 60%. Wall motion was normal; there were no regional wall motion abnormalities.    Neuro/Psych  Headaches, PSYCHIATRIC DISORDERS Anxiety    GI/Hepatic negative GI ROS, Neg liver ROS,   Endo/Other  diabetesMorbid obesity  Renal/GU ESRF and DialysisRenal disease     Musculoskeletal   Abdominal   Peds  Hematology   Anesthesia Other Findings   Reproductive/Obstetrics                           Anesthesia Physical Anesthesia Plan  ASA: III  Anesthesia Plan: General   Post-op Pain Management:    Induction: Intravenous  Airway Management Planned: Oral ETT  Additional Equipment:   Intra-op Plan:   Post-operative Plan: Extubation in OR  Informed Consent: I have reviewed the patients History and Physical, chart, labs and discussed the procedure including the risks, benefits and alternatives for the proposed anesthesia with the patient or authorized representative who has indicated his/her understanding and acceptance.   Dental advisory given  Plan Discussed with: CRNA, Anesthesiologist and Surgeon  Anesthesia Plan Comments:        Anesthesia Quick  Evaluation

## 2014-10-21 NOTE — Anesthesia Procedure Notes (Signed)
Procedure Name: Intubation Date/Time: 10/21/2014 7:37 AM Performed by: Garrison Columbus T Pre-anesthesia Checklist: Patient identified, Emergency Drugs available, Suction available and Patient being monitored Patient Re-evaluated:Patient Re-evaluated prior to inductionOxygen Delivery Method: Circle system utilized Preoxygenation: Pre-oxygenation with 100% oxygen Intubation Type: IV induction and Rapid sequence Laryngoscope Size: Miller and 2 Grade View: Grade I Tube type: Oral Tube size: 7.5 mm Number of attempts: 1 Airway Equipment and Method: Stylet Placement Confirmation: ETT inserted through vocal cords under direct vision,  positive ETCO2 and breath sounds checked- equal and bilateral Secured at: 23 cm Tube secured with: Tape Dental Injury: Teeth and Oropharynx as per pre-operative assessment

## 2014-10-21 NOTE — Anesthesia Postprocedure Evaluation (Signed)
Anesthesia Post Note  Patient: Kirk [name]  Procedure(s) Performed: Procedure(s) (LRB): LAPAROSCOPIC CHOLECYSTECTOMY WITH INTRAOPERATIVE CHOLANGIOGRAM (N/A)  Anesthesia type: general  Patient location: PACU  Post pain: Pain level controlled  Post assessment: Patient's Cardiovascular Status Stable  Last Vitals:  Filed Vitals:   10/21/14 0945  BP: 128/65  Pulse: 71  Temp:   Resp: 22    Post vital signs: Reviewed and stable  Level of consciousness: sedated  Complications: No apparent anesthesia complications

## 2014-10-21 NOTE — H&P (Signed)
Kirk [name] 08/03/2014 8:54 AM Location: [facility] Patient #: [id number] DOB: [birth year] Married / Language: English / Race: Black or African American Male  History of Present Illness Sammuel Hines. Marlou Starks MD; 08/03/2014 9:49 AM) Patient words: gallstones.  The patient is a 54 year old male who presents with abdominal pain. We are asked to see the patient in consultation by Dr. Jeanmarie Hubert to evaluate him for gallstones. The patient is a 54 year old male who was recently hospitalized with pneumonia in early June. About 1 week after being discharged from the hospital he return to the hospital with vomiting and severe upper abdominal pain. The pain radiated into his back and right shoulder. A CT scan was obtained that did show stones in the gallbladder but no gallbladder wall thickening or ductal dilatation. His liver functions were normal. He did have some slight elevation of his amylase. He had a second less severe episode of pain shortly after this. He is a dialysis patient and gets dialyzed on Mondays Wednesdays and Fridays.   Other Problems Davy Pique Bynum, CMA; 08/03/2014 8:54 AM) Cholelithiasis Chronic Obstructive Lung Disease Chronic Renal Failure Syndrome Gastroesophageal Reflux Disease Hemorrhoids High blood pressure Hypercholesterolemia Pancreatitis Sleep Apnea  Past Surgical History Marjean Donna, CMA; 08/03/2014 8:54 AM) Colon Polyp Removal - Open Nephrectomy Right.  Diagnostic Studies History Marjean Donna, CMA; 08/03/2014 8:54 AM) Colonoscopy 1-5 years ago  Allergies Davy Pique Bynum, CMA; 08/03/2014 8:55 AM) Codeine Sulfate *ANALGESICS - OPIOID*  Medication History (Sonya Bynum, CMA; 08/03/2014 8:55 AM) Tylenol (325MG  Tablet, 1 (one) Oral) Active. Aspirin (81MG  Tablet, 1 (one) Oral) Active. Vitamin B Complex (1 (one) Oral) Active. Multiple Vitamin-Folic Acid (1 (one) Oral) Active. Combivent Respimat (20-100MCG/ACT Aerosol Soln, Inhalation)  Active. AmLODIPine Besylate (10MG  Tablet, Oral) Active. Calcium Acetate (667MG  Capsule, Oral) Active. Carvedilol (25MG  Tablet, Oral) Active. Coreg (25MG  Tablet, Oral) Active. Keflex (250MG  Capsule, Oral) Active. Cholecalciferol Active. Benadryl Allergy (25MG  Capsule, Oral) Active. Doxazosin Mesylate (4MG  Tablet, Oral) Active. HydrALAZINE HCl (25MG  Tablet, Oral) Active. Zofran (4MG  Tablet, Oral) Active. PredniSONE (Pak) (5MG  Tablet, Oral) Active. Tacrolimus (1MG  Capsule, Oral) Active. TraMADol HCl (50MG  Tablet, Oral) Active. Medications Reconciled  Social History Marjean Donna, CMA; 08/03/2014 8:54 AM) Alcohol use Remotely quit alcohol use. Caffeine use Carbonated beverages, Coffee, Tea. No drug use Tobacco use Current some day smoker.  Family History Marjean Donna, [place]; 08/03/2014 8:54 AM) Hypertension Brother.  Review of Systems Davy Pique Bynum CMA; 08/03/2014 8:54 AM) General Present- Fatigue and Weight Loss. Not Present- Appetite Loss, Chills, Fever, Night Sweats and Weight Gain. Skin Present- Change in Wart/Mole. Not Present- Dryness, Hives, Jaundice, New Lesions, Non-Healing Wounds, Rash and Ulcer. HEENT Present- Earache, Hearing Loss, Ringing in the Ears, Sore Throat and Wears glasses/contact lenses. Not Present- Hoarseness, Nose Bleed, Oral Ulcers, Seasonal Allergies, Sinus Pain, Visual Disturbances and Yellow Eyes. Respiratory Present- Difficulty Breathing, Snoring and Wheezing. Not Present- Bloody sputum and Chronic Cough. Cardiovascular Present- Difficulty Breathing Lying Down, Shortness of Breath and Swelling of Extremities. Not Present- Chest Pain, Leg Cramps, Palpitations and Rapid Heart Rate. Gastrointestinal Present- Abdominal Pain, Bloating, Excessive gas, Gets full quickly at meals, Hemorrhoids, Indigestion and Nausea. Not Present- Bloody Stool, Change in Bowel Habits, Chronic diarrhea, Constipation, Difficulty Swallowing, Rectal Pain and Vomiting. Male  Genitourinary Present- Impotence. Not Present- Blood in Urine, Change in Urinary Stream, Frequency, Nocturia, Painful Urination, Urgency and Urine Leakage.   Vitals (Sonya Bynum CMA; 08/03/2014 8:54 AM) 08/03/2014 8:54 AM Weight: 237 lb Height: 67in Body Surface Area: 2.25 m Body Mass  Index: 37.12 kg/m Temp.: 97.65F(Temporal)  Pulse: 78 (Regular)  BP: 138/80 (Sitting, Left Arm, Standard)    Physical Exam Eddie Dibbles S. Marlou Starks MD; 08/03/2014 9:50 AM) General Mental Status-Alert. General Appearance-Consistent with stated age. Hydration-Well hydrated. Voice-Normal.  Head and Neck Head-normocephalic, atraumatic with no lesions or palpable masses. Trachea-midline. Thyroid Gland Characteristics - normal size and consistency.  Eye Eyeball - Bilateral-Extraocular movements intact. Sclera/Conjunctiva - Bilateral-No scleral icterus.  Chest and Lung Exam Chest and lung exam reveals -quiet, even and easy respiratory effort with no use of accessory muscles and on auscultation, normal breath sounds, no adventitious sounds and normal vocal resonance. Inspection Chest Wall - Normal. Back - normal.  Cardiovascular Cardiovascular examination reveals -normal heart sounds, regular rate and rhythm with no murmurs and normal pedal pulses bilaterally.  Abdomen Note: The abdomen is soft and nontender. There is a well-healed right lower quadrant hockey stick type incision from a previous failed kidney transplant.   Neurologic Neurologic evaluation reveals -alert and oriented x 3 with no impairment of recent or remote memory. Mental Status-Normal.  Musculoskeletal Normal Exam - Left-Upper Extremity Strength Normal and Lower Extremity Strength Normal. Normal Exam - Right-Upper Extremity Strength Normal and Lower Extremity Strength Normal. Note: There is a good thrill in the fistula in his left forearm   Lymphatic Head & Neck  General Head & Neck  Lymphatics: Bilateral - Description - Normal. Axillary  General Axillary Region: Bilateral - Description - Normal. Tenderness - Non Tender. Femoral & Inguinal  Generalized Femoral & Inguinal Lymphatics: Bilateral - Description - Normal. Tenderness - Non Tender.    Assessment & Plan Eddie Dibbles S. Marlou Starks MD; 08/03/2014 9:32 AM) GALLSTONES (574.20  K80.20) Impression: The patient appears to have symptomatic gallstones. Because of the risk of further painful episodes and possible pancreatitis I think he would benefit from having his gallbladder removed. He would also like to have this done. I have discussed with him in detail the risks and benefits of the operation to remove the gallbladder as well as some of the technical aspects and he understands and wishes to proceed. We will plan to schedule this around his dialysis schedule of Monday Wednesday and Friday. Current Plans  Pt Education - Gallstones: discussed with patient and provided information.   Signed by Luella Cook, MD (08/03/2014 9:52 AM)

## 2014-10-21 NOTE — Progress Notes (Signed)
Pt reports that he takes his coreg at 2300 and 1100 he took last night  Dr Tobias Alexander informed of this no need to take coreg at this time.

## 2014-10-21 NOTE — Op Note (Signed)
10/21/2014  8:31 AM  PATIENT:  Kirk [name]  54 y.o. male  PRE-OPERATIVE DIAGNOSIS:  CHOLELITHIASIS  POST-OPERATIVE DIAGNOSIS:  CHOLELITHIASIS  PROCEDURE:  Procedure(s): LAPAROSCOPIC CHOLECYSTECTOMY WITH INTRAOPERATIVE CHOLANGIOGRAM (N/A)  SURGEON:  Surgeon(s) and Role:    * Jovita Kussmaul, MD - Primary    * Erroll Luna, MD - Assisting  PHYSICIAN ASSISTANT:   ASSISTANTS: Dr. Brantley Stage   ANESTHESIA:   general  EBL:  Total I/O In: 300 [I.V.:300] Out: 20 [Blood:20]  BLOOD ADMINISTERED:none  DRAINS: none   LOCAL MEDICATIONS USED:  MARCAINE     SPECIMEN:  Source of Specimen:  gallbladder  DISPOSITION OF SPECIMEN:  PATHOLOGY  COUNTS:  YES  TOURNIQUET:  * No tourniquets in log *  DICTATION: .Dragon Dictation   Procedure: After informed consent was obtained the patient was brought to the operating room and placed in the supine position on the operating room table. After adequate induction of general anesthesia the patient's abdomen was prepped with ChloraPrep allowed to dry and draped in usual sterile manner. The area below the umbilicus was infiltrated with quarter percent  Marcaine. A small incision was made with a 15 blade knife. The incision was carried down through the subcutaneous tissue bluntly with a hemostat and [employer] retractors. The linea alba was identified. The linea alba was incised with a 15 blade knife and each side was grasped with Coker clamps. The preperitoneal space was then probed with a hemostat until the peritoneum was opened and access was gained to the abdominal cavity. A 0 Vicryl pursestring stitch was placed in the fascia surrounding the opening. A Hassan cannula was then placed through the opening and anchored in place with the previously placed Vicryl purse string stitch. The abdomen was insufflated with carbon dioxide without difficulty. A laparoscope was inserted through the [facility] cannula in the right upper quadrant was inspected. Next the  epigastric region was infiltrated with % Marcaine. A small incision was made with a 15 blade knife. A 5 mm port was placed bluntly through this incision into the abdominal cavity under direct vision. Next 2 sites were chosen laterally on the right side of the abdomen for placement of 5 mm ports. Each of these areas was infiltrated with quarter percent Marcaine. Small stab incisions were made with a 15 blade knife. 5 mm ports were then placed bluntly through these incisions into the abdominal cavity under direct vision without difficulty. A blunt grasper was placed through the lateralmost 5 mm port and used to grasp the dome of the gallbladder and elevated anteriorly and superiorly. Another blunt grasper was placed through the other 5 mm port and used to retract the body and neck of the gallbladder. A dissector was placed through the epigastric port and using the electrocautery the peritoneal reflection at the gallbladder neck was opened. Blunt dissection was then carried out in this area until the gallbladder neck-cystic duct junction was readily identified and a good window was created. A single clip was placed on the gallbladder neck. A small  ductotomy was made just below the clip with laparoscopic scissors. A 14-gauge Angiocath was then placed through the anterior abdominal wall under direct vision. A Reddick cholangiogram catheter was then placed through the Angiocath and flushed. The catheter was then placed in the cystic duct and anchored in place with a clip. A cholangiogram was obtained that showed no filling defects good emptying into the duodenum an adequate length on the cystic duct. The anchoring clip and catheters were then  removed from the patient. 3 clips were placed proximally on the cystic duct and the duct was divided between the 2 sets of clips. Posterior to this the cystic artery was identified and again dissected bluntly in a circumferential manner until a good window  was created. 2 clips  were placed proximally and one distally on the artery and the artery was divided between the 2 sets of clips. Next a laparoscopic hook cautery device was used to separate the gallbladder from the liver bed. Prior to completely detaching the gallbladder from the liver bed the liver bed was inspected and several small bleeding points were coagulated with the electrocautery until the area was completely hemostatic. The gallbladder was then detached the rest of it from the liver bed without difficulty. A laparoscopic bag was inserted through the hassan port. The laparoscope was moved to the epigastric port. The gallbladder was placed within the bag and the bag was sealed.  The bag with the gallbladder was then removed with the [facility] cannula through the infraumbilical port without difficulty. The fascial defect was then closed with the previously placed Vicryl pursestring stitch as well as with another figure-of-eight 0 Vicryl stitch. The liver bed was inspected again and found to be hemostatic. The abdomen was irrigated with copious amounts of saline until the effluent was clear. The ports were then removed under direct vision without difficulty and were found to be hemostatic. The gas was allowed to escape. The skin incisions were all closed with interrupted 4-0 Monocryl subcuticular stitches. Dermabond dressings were applied. The patient tolerated the procedure well. At the end of the case all needle sponge and instrument counts were correct. The patient was then awakened and taken to recovery in stable condition  PLAN OF CARE: Admit for overnight observation  PATIENT DISPOSITION:  PACU - hemodynamically stable.   Delay start of Pharmacological VTE agent (>24hrs) due to surgical blood loss or risk of bleeding: no

## 2014-10-22 ENCOUNTER — Encounter (HOSPITAL_COMMUNITY): Payer: Self-pay | Admitting: General Surgery

## 2014-10-22 DIAGNOSIS — T8612 Kidney transplant failure: Secondary | ICD-10-CM | POA: Diagnosis not present

## 2014-10-22 DIAGNOSIS — I120 Hypertensive chronic kidney disease with stage 5 chronic kidney disease or end stage renal disease: Secondary | ICD-10-CM | POA: Diagnosis not present

## 2014-10-22 DIAGNOSIS — N2581 Secondary hyperparathyroidism of renal origin: Secondary | ICD-10-CM | POA: Diagnosis not present

## 2014-10-22 DIAGNOSIS — D631 Anemia in chronic kidney disease: Secondary | ICD-10-CM | POA: Diagnosis not present

## 2014-10-22 DIAGNOSIS — E089 Diabetes mellitus due to underlying condition without complications: Secondary | ICD-10-CM | POA: Diagnosis not present

## 2014-10-22 DIAGNOSIS — N186 End stage renal disease: Secondary | ICD-10-CM | POA: Diagnosis not present

## 2014-10-22 DIAGNOSIS — Z992 Dependence on renal dialysis: Secondary | ICD-10-CM | POA: Diagnosis not present

## 2014-10-22 DIAGNOSIS — Z23 Encounter for immunization: Secondary | ICD-10-CM | POA: Diagnosis not present

## 2014-10-22 DIAGNOSIS — J449 Chronic obstructive pulmonary disease, unspecified: Secondary | ICD-10-CM | POA: Diagnosis not present

## 2014-10-22 DIAGNOSIS — K8010 Calculus of gallbladder with chronic cholecystitis without obstruction: Secondary | ICD-10-CM | POA: Diagnosis not present

## 2014-10-22 MED ORDER — OXYCODONE-ACETAMINOPHEN 5-325 MG PO TABS: 1.0000 | ORAL_TABLET | ORAL | Status: DC | PRN

## 2014-10-22 NOTE — Progress Notes (Signed)
1 Day Post-Op  Subjective: No complaints  Objective: Vital signs in last 24 hours: Temp:  [97.6 F (36.4 C)-98.8 F (37.1 C)] 98.3 F (36.8 C) (09/30 0535) Pulse Rate:  [68-86] 78 (09/30 0535) Resp:  [11-26] 18 (09/30 0535) BP: (121-149)/(49-80) 144/69 mmHg (09/30 0535) SpO2:  [94 %-100 %] 96 % (09/30 0535)    Intake/Output from previous day: 09/29 0701 - 09/30 0700 In: 540 [P.O.:240; I.V.:300] Out: 20 [Blood:20] Intake/Output this shift:    Resp: clear to auscultation bilaterally Cardio: regular rate and rhythm GI: soft, minimal tenderness. incisions look good  Lab Results:   Recent Labs  10/19/14 1508 10/21/14 0630  WBC 3.3*  --   HGB 8.4* 8.5*  HCT 24.9* 25.0*  PLT 116*  --    BMET  Recent Labs  10/19/14 1508 10/21/14 0630  NA 137 140  K 3.9 3.7  CL 97*  --   CO2 27  --   GLUCOSE 95 127*  BUN 44*  --   CREATININE 12.04*  --   CALCIUM 8.0*  --    PT/INR No results for input(s): LABPROT, INR in the last 72 hours. ABG No results for input(s): PHART, HCO3 in the last 72 hours.  Invalid input(s): PCO2, PO2  Studies/Results: Dg Cholangiogram Operative  10/21/2014   CLINICAL DATA:  Cholelithiasis  EXAM: INTRAOPERATIVE CHOLANGIOGRAM  TECHNIQUE: Cholangiographic images from the C-arm fluoroscopic device were submitted for interpretation post-operatively. Please see the procedural report for the amount of contrast and the fluoroscopy time utilized.  COMPARISON:  None available  FINDINGS: No persistent filling defects in the common duct. Intrahepatic ducts are incompletely visualized, appearing decompressed centrally. Contrast passes into the duodenum.  : Negative for retained common duct stone.   Electronically Signed   By: Lucrezia Europe M.D.   On: 10/21/2014 08:36    Anti-infectives: Anti-infectives    Start     Dose/Rate Route Frequency Ordered Stop   10/21/14 0700  ceFAZolin (ANCEF) IVPB 2 g/50 mL premix     2 g 100 mL/hr over 30 Minutes Intravenous To  [facility] 10/20/14 0840 10/21/14 0740      Assessment/Plan: s/p Procedure(s): LAPAROSCOPIC CHOLECYSTECTOMY WITH INTRAOPERATIVE CHOLANGIOGRAM (N/A) Advance diet Discharge after dialysis     TOTH III,PAUL S 10/22/2014

## 2014-10-22 NOTE — Progress Notes (Signed)
Patient discharged home with family. Patient will have outpatient dialysis today in [place]. Discharge instructions given. Prescriptions given.

## 2014-10-22 NOTE — Discharge Summary (Signed)
Physician Discharge Summary  Patient ID: Kirk [name] MRN: [id number] DOB/AGE: 1960/10/22 54 y.o.  Admit date: 10/21/2014 Discharge date: 10/22/2014  Admission Diagnoses:  Discharge Diagnoses:  Active Problems:   Gallstones   Discharged Condition: good  Hospital Course: the pt underwent lap chole. He tolerated surgery well. He stayed overnight because of his history of breathing problems. On pod 1 he looks good. He will be dialysed and then discharged home  Consults: nephrology  Significant Diagnostic Studies: none  Treatments: surgery: as above  Discharge Exam: Blood pressure 144/69, pulse 78, temperature 98.3 F (36.8 C), temperature source Oral, resp. rate 18, height 5' 7.5" (1.715 m), weight 106.595 kg (235 lb), SpO2 96 %. Resp: clear to auscultation bilaterally Cardio: regular rate and rhythm GI: soft, minimal tenderness  Disposition: 01-Home or Self Care  Discharge Instructions    Call MD for:  difficulty breathing, headache or visual disturbances    Complete by:  As directed      Call MD for:  extreme fatigue    Complete by:  As directed      Call MD for:  hives    Complete by:  As directed      Call MD for:  persistant dizziness or light-headedness    Complete by:  As directed      Call MD for:  persistant nausea and vomiting    Complete by:  As directed      Call MD for:  redness, tenderness, or signs of infection (pain, swelling, redness, odor or green/yellow discharge around incision site)    Complete by:  As directed      Call MD for:  severe uncontrolled pain    Complete by:  As directed      Call MD for:  temperature >100.4    Complete by:  As directed      Diet - low sodium heart healthy    Complete by:  As directed      Discharge instructions    Complete by:  As directed   May shower. No heavy lifting. Low fat diet     Increase activity slowly    Complete by:  As directed      No wound care    Complete by:  As directed              Medication List    TAKE these medications        acetaminophen 500 MG tablet  Commonly known as:  TYLENOL  Take 1 tablet (500 mg total) by mouth every 6 (six) hours as needed for moderate pain.     albuterol (2.5 MG/3ML) 0.083% nebulizer solution  Commonly known as:  PROVENTIL  Take 3 mLs (2.5 mg total) by nebulization every 6 (six) hours as needed for wheezing.     amLODipine 10 MG tablet  Commonly known as:  NORVASC  Take 1 tablet (10 mg total) by mouth daily.     aspirin EC 81 MG tablet  Take 81 mg by mouth daily.     b complex-vitamin c-folic acid 0.8 MG Tabs tablet  Take 1 tablet by mouth daily.     BENADRYL 25 MG tablet  Generic drug:  diphenhydrAMINE  Take 50 mg by mouth 2 (two) times daily.     budesonide-formoterol 80-4.5 MCG/ACT inhaler  Commonly known as:  SYMBICORT  Inhale 2 puffs into the lungs 2 (two) times daily.     calcitRIOL 0.25 MCG capsule  Commonly known as:  ROCALTROL  Take 1 capsule (0.25 mcg total) by mouth every Monday, Wednesday, and Friday with hemodialysis.     calcium acetate (Phos Binder) 667 MG/5ML Soln  Commonly known as:  PHOSLYRA  Take 2,001 mg by mouth 3 (three) times daily with meals. And 667 mg with snack     carvedilol 25 MG tablet  Commonly known as:  COREG  Take 1 tablet (25 mg total) by mouth 2 (two) times daily. Take one tablet in the morning and one tablet in the evening     doxazosin 4 MG tablet  Commonly known as:  CARDURA  Take 0.5 tablet  by mouth in the am and 0.5 tablet by mouth in the pm to regulate blood pressure.     guaiFENesin-dextromethorphan 100-10 MG/5ML syrup  Commonly known as:  ROBITUSSIN DM  Take 10 mLs by mouth every 4 (four) hours as needed for cough.     oxyCODONE-acetaminophen 5-325 MG tablet  Commonly known as:  PERCOCET/ROXICET  Take 1-2 tablets by mouth every 8 (eight) hours as needed for severe pain.     oxyCODONE-acetaminophen 5-325 MG tablet  Commonly known as:  ROXICET  Take 1-2 tablets  by mouth every 4 (four) hours as needed.     pravastatin 40 MG tablet  Commonly known as:  PRAVACHOL  Take 1 tablet (40 mg total) by mouth every evening.     promethazine 25 MG tablet  Commonly known as:  PHENERGAN  Take 1 tablet (25 mg total) by mouth every 8 (eight) hours as needed for nausea or vomiting.     traMADol 50 MG tablet  Commonly known as:  ULTRAM  One up to 4 times daly if needed for pain     Vitamin D3 5000 UNITS Tabs  Take 1 tablet by mouth daily.           Follow-up Information    Follow up with Merrie Roof, MD In 2 weeks.   Specialty:  General Surgery   Contact information:   [address] [place] [address] [phone]       Signed: Merrie Roof 10/22/2014, 8:35 AM

## 2014-10-25 DIAGNOSIS — E089 Diabetes mellitus due to underlying condition without complications: Secondary | ICD-10-CM | POA: Diagnosis not present

## 2014-10-25 DIAGNOSIS — N2581 Secondary hyperparathyroidism of renal origin: Secondary | ICD-10-CM | POA: Diagnosis not present

## 2014-10-25 DIAGNOSIS — N186 End stage renal disease: Secondary | ICD-10-CM | POA: Diagnosis not present

## 2014-10-25 DIAGNOSIS — D631 Anemia in chronic kidney disease: Secondary | ICD-10-CM | POA: Diagnosis not present

## 2014-10-27 DIAGNOSIS — D631 Anemia in chronic kidney disease: Secondary | ICD-10-CM | POA: Diagnosis not present

## 2014-10-27 DIAGNOSIS — N186 End stage renal disease: Secondary | ICD-10-CM | POA: Diagnosis not present

## 2014-10-27 DIAGNOSIS — E089 Diabetes mellitus due to underlying condition without complications: Secondary | ICD-10-CM | POA: Diagnosis not present

## 2014-10-27 DIAGNOSIS — N2581 Secondary hyperparathyroidism of renal origin: Secondary | ICD-10-CM | POA: Diagnosis not present

## 2014-10-29 ENCOUNTER — Other Ambulatory Visit: Payer: Self-pay | Admitting: Internal Medicine

## 2014-10-29 DIAGNOSIS — N2581 Secondary hyperparathyroidism of renal origin: Secondary | ICD-10-CM | POA: Diagnosis not present

## 2014-10-29 DIAGNOSIS — E089 Diabetes mellitus due to underlying condition without complications: Secondary | ICD-10-CM | POA: Diagnosis not present

## 2014-10-29 DIAGNOSIS — D631 Anemia in chronic kidney disease: Secondary | ICD-10-CM | POA: Diagnosis not present

## 2014-10-29 DIAGNOSIS — N186 End stage renal disease: Secondary | ICD-10-CM | POA: Diagnosis not present

## 2014-11-01 DIAGNOSIS — E089 Diabetes mellitus due to underlying condition without complications: Secondary | ICD-10-CM | POA: Diagnosis not present

## 2014-11-01 DIAGNOSIS — N2581 Secondary hyperparathyroidism of renal origin: Secondary | ICD-10-CM | POA: Diagnosis not present

## 2014-11-01 DIAGNOSIS — D631 Anemia in chronic kidney disease: Secondary | ICD-10-CM | POA: Diagnosis not present

## 2014-11-01 DIAGNOSIS — N186 End stage renal disease: Secondary | ICD-10-CM | POA: Diagnosis not present

## 2014-11-03 DIAGNOSIS — D631 Anemia in chronic kidney disease: Secondary | ICD-10-CM | POA: Diagnosis not present

## 2014-11-03 DIAGNOSIS — N186 End stage renal disease: Secondary | ICD-10-CM | POA: Diagnosis not present

## 2014-11-03 DIAGNOSIS — E089 Diabetes mellitus due to underlying condition without complications: Secondary | ICD-10-CM | POA: Diagnosis not present

## 2014-11-03 DIAGNOSIS — N2581 Secondary hyperparathyroidism of renal origin: Secondary | ICD-10-CM | POA: Diagnosis not present

## 2014-11-05 DIAGNOSIS — N186 End stage renal disease: Secondary | ICD-10-CM | POA: Diagnosis not present

## 2014-11-05 DIAGNOSIS — N2581 Secondary hyperparathyroidism of renal origin: Secondary | ICD-10-CM | POA: Diagnosis not present

## 2014-11-05 DIAGNOSIS — E089 Diabetes mellitus due to underlying condition without complications: Secondary | ICD-10-CM | POA: Diagnosis not present

## 2014-11-05 DIAGNOSIS — D631 Anemia in chronic kidney disease: Secondary | ICD-10-CM | POA: Diagnosis not present

## 2014-11-08 DIAGNOSIS — D631 Anemia in chronic kidney disease: Secondary | ICD-10-CM | POA: Diagnosis not present

## 2014-11-08 DIAGNOSIS — N2581 Secondary hyperparathyroidism of renal origin: Secondary | ICD-10-CM | POA: Diagnosis not present

## 2014-11-08 DIAGNOSIS — E089 Diabetes mellitus due to underlying condition without complications: Secondary | ICD-10-CM | POA: Diagnosis not present

## 2014-11-08 DIAGNOSIS — N186 End stage renal disease: Secondary | ICD-10-CM | POA: Diagnosis not present

## 2014-11-09 ENCOUNTER — Encounter: Payer: Self-pay | Admitting: Internal Medicine

## 2014-11-09 ENCOUNTER — Ambulatory Visit (INDEPENDENT_AMBULATORY_CARE_PROVIDER_SITE_OTHER): Payer: Medicare Other | Admitting: Internal Medicine

## 2014-11-09 VITALS — BP 138/76 | HR 91 | Temp 98.2°F | Resp 20 | Ht 68.0 in | Wt 231.0 lb

## 2014-11-09 DIAGNOSIS — Z9989 Dependence on other enabling machines and devices: Secondary | ICD-10-CM

## 2014-11-09 DIAGNOSIS — E1121 Type 2 diabetes mellitus with diabetic nephropathy: Secondary | ICD-10-CM | POA: Diagnosis not present

## 2014-11-09 DIAGNOSIS — N186 End stage renal disease: Secondary | ICD-10-CM

## 2014-11-09 DIAGNOSIS — N189 Chronic kidney disease, unspecified: Secondary | ICD-10-CM

## 2014-11-09 DIAGNOSIS — I10 Essential (primary) hypertension: Secondary | ICD-10-CM

## 2014-11-09 DIAGNOSIS — G4733 Obstructive sleep apnea (adult) (pediatric): Secondary | ICD-10-CM

## 2014-11-09 DIAGNOSIS — C44629 Squamous cell carcinoma of skin of left upper limb, including shoulder: Secondary | ICD-10-CM | POA: Diagnosis not present

## 2014-11-09 DIAGNOSIS — R413 Other amnesia: Secondary | ICD-10-CM

## 2014-11-09 DIAGNOSIS — K8020 Calculus of gallbladder without cholecystitis without obstruction: Secondary | ICD-10-CM

## 2014-11-09 DIAGNOSIS — Z992 Dependence on renal dialysis: Secondary | ICD-10-CM | POA: Diagnosis not present

## 2014-11-09 DIAGNOSIS — C44621 Squamous cell carcinoma of skin of unspecified upper limb, including shoulder: Secondary | ICD-10-CM | POA: Insufficient documentation

## 2014-11-09 DIAGNOSIS — D631 Anemia in chronic kidney disease: Secondary | ICD-10-CM | POA: Diagnosis not present

## 2014-11-09 MED ORDER — DOXAZOSIN MESYLATE 4 MG PO TABS: ORAL_TABLET | ORAL | Status: DC

## 2014-11-09 NOTE — Progress Notes (Signed)
Patient ID: Kirk [name], male   DOB: 1961/01/05, 54 y.o.   MRN: [id number]    Facility  [facility]    Place of Service:   OFFICE    Allergies  Allergen Reactions  . Codeine Nausea And Vomiting and Nausea Only    Chief Complaint  Patient presents with  . Medical Management of Chronic Issues    Having problems with c-pap machine, ? sleep apnea    HPI:  Hospitalized 10/21/14 to 10/22/14 for cholecystectomy by Dr. Autumn Messing. Has recovered fine. No further abd pain. Stools normal.  Essential hypertension - controlled on current medication  Type 2 diabetes mellitus with diabetic nephropathy, without long-term current use of insulin (HCC) - stable  Anemia in chronic renal disease - hemoglobin 8.5 when checked in late September 2016.  ESRD on dialysis [facility]) - continues on 3 day per week hemodialysis. She had access in the left forearm.  OSA on CPAP - Using CPAP, but was not sleeping well, so he stopped using it. It was blowing air and would then cut itself off. Had prior sleep study at sleep lab on [address]. he would like an appointment with the sleep study lab on [address].  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 Keratotic surface and slightly inflamed.. Suspicious for squamous cell cancer.  Medications: Patient's Medications  New Prescriptions   No medications on file  Previous Medications   ACETAMINOPHEN (TYLENOL) 500 MG TABLET    Take 1 tablet (500 mg total) by mouth every 6 (six) hours as needed for moderate pain.   AMLODIPINE (NORVASC) 10 MG TABLET    Take 1 tablet (10 mg total) by mouth daily.   ASPIRIN EC 81 MG TABLET    Take 81 mg by mouth daily.   B COMPLEX-VITAMIN C-FOLIC ACID (NEPHRO-VITE) 0.8 MG TABS TABLET    Take 1 tablet by mouth daily.   BUDESONIDE-FORMOTEROL (SYMBICORT) 80-4.5 MCG/ACT INHALER    Inhale 2 puffs into the lungs 2 (two) times daily.   CALCITRIOL (ROCALTROL) 0.25 MCG CAPSULE    Take 1 capsule (0.25 mcg total) by  mouth every Monday, Wednesday, and Friday with hemodialysis.   CALCIUM ACETATE, PHOS BINDER, (PHOSLYRA) 667 MG/5ML SOLN    Take 2,001 mg by mouth 3 (three) times daily with meals. And 667 mg with snack   CARVEDILOL (COREG) 25 MG TABLET    Take 1 tablet (25 mg total) by mouth 2 (two) times daily. Take one tablet in the morning and one tablet in the evening   CHOLECALCIFEROL (VITAMIN D3) 5000 UNITS TABS    Take 1 tablet by mouth daily.   DIPHENHYDRAMINE (BENADRYL) 25 MG TABLET    Take 50 mg by mouth 2 (two) times daily.    DOXAZOSIN (CARDURA) 4 MG TABLET    Take 0.5 tablet  by mouth in the am and 0.5 tablet by mouth in the pm to regulate blood pressure.   GUAIFENESIN-DEXTROMETHORPHAN (ROBITUSSIN DM) 100-10 MG/5ML SYRUP    Take 10 mLs by mouth every 4 (four) hours as needed for cough.   OXYCODONE-ACETAMINOPHEN (PERCOCET/ROXICET) 5-325 MG PER TABLET    Take 1-2 tablets by mouth every 8 (eight) hours as needed for severe pain.   OXYCODONE-ACETAMINOPHEN (ROXICET) 5-325 MG TABLET    Take 1-2 tablets by mouth every 4 (four) hours as needed.   PRAVASTATIN (PRAVACHOL) 40 MG TABLET    Take 1 tablet (40 mg total) by mouth every evening.   PROMETHAZINE (  PHENERGAN) 25 MG TABLET    Take 1 tablet (25 mg total) by mouth every 8 (eight) hours as needed for nausea or vomiting.   TRAMADOL (ULTRAM) 50 MG TABLET    One up to 4 times daly if needed for pain  Modified Medications   No medications on file  Discontinued Medications   ALBUTEROL (PROVENTIL) (2.5 MG/3ML) 0.083% NEBULIZER SOLUTION    Take 3 mLs (2.5 mg total) by nebulization every 6 (six) hours as needed for wheezing.   B COMPLEX-VITAMIN C-FOLIC ACID (NEPHRO-VITE) 0.8 MG TABS TABLET    TAKE 1 TABLET BY MOUTH EVERY DAY    Review of Systems  Constitutional: Negative for fever.       Chronically fatigued.  HENT: Positive for ear discharge and hearing loss. Negative for ear pain and tinnitus.   Eyes: Negative.   Respiratory: Negative for shortness of breath.         Dyspnea on exertion has improved. Pneumonia. Hospitalized 04/06/14 and 06/23/2014. Rehospitalized 09/18/2014 for increasing dyspnea felt secondary to acute exacerbation of his COPD plus fluid overload.  Cardiovascular: Negative for chest pain, palpitations and leg swelling.  Gastrointestinal: Negative for abdominal pain and abdominal distention.       Cholecystectomy 10/21/2014.  Endocrine:       Diabetes is under better control.  Genitourinary: Negative.        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  Musculoskeletal:       Neck discomfort. Chronic right knee discomfort. Chronic back pains. Unsteady gait.  Skin:       Tissue fibrosis at the laparoscopic cholecystectomy incisions in the right abdomen and maple.  Neurological: Positive for dizziness.       Memory impairment is stable. Feet feel numb. Recurrent headaches  Hematological:       Anemia of CKD.  Psychiatric/Behavioral: Positive for confusion and decreased concentration.       Insomnia. Falls asleep in the day.    Filed Vitals:   11/09/14 1612  BP: 138/76  Pulse: 91  Temp: 98.2 F (36.8 C)  TempSrc: Oral  Resp: 20  Height: $Remove'5\' 8"'OoPfpDy$  (1.727 m)  Weight: 231 lb (104.781 kg)  SpO2: 97%   Body mass index is 35.13 kg/(m^2).  Physical Exam  Constitutional: He is oriented to person, place, and time.  Obese.  HENT:  Loss of hearing.  Eyes:  Corrective lenses  Neck: No JVD present. No tracheal deviation present. No thyromegaly present.  Cardiovascular: Exam reveals no gallop.   No murmur heard. AF. 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  Abdominal: Soft. Bowel sounds are normal. He exhibits no distension and no mass. There is no tenderness.  Tender in the RUQ. Frequent nausea.  Musculoskeletal: Normal range of motion. He exhibits tenderness. He exhibits no edema.  Scar left knee. Tender right knee  Lymphadenopathy:    He has no cervical adenopathy.  Neurological: He  is alert and oriented to person, place, and time. No cranial nerve deficit. Coordination normal.  Positive Tinel sign bilaterally  Skin: No rash noted. No erythema. No pallor.  Tissue fibrosis at cholecystectomy incisions in the right abdomen and umbilicus. Seborrheic nonhealing lesion of the left forearm. 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  Psychiatric:  Drowsy during the exam.    Labs reviewed: Lab Summary Latest Ref Rng 10/21/2014 10/19/2014 09/21/2014 09/20/2014  Hemoglobin 13.0 - 17.0 g/dL 8.5(L) 8.4(L) (None) (None)  Hematocrit 39.0 - 52.0 % 25.0(L) 24.9(L) (None) (  None)  White count 4.0 - 10.5 K/uL (None) 3.3(L) (None) (None)  Platelet count 150 - 400 K/uL (None) 116(L) (None) (None)  Sodium 135 - 145 mmol/L 140 137 137 133(L)  Potassium 3.5 - 5.1 mmol/L 3.7 3.9 4.2 5.4(H)  Calcium 8.9 - 10.3 mg/dL (None) 8.0(L) 8.5(L) 8.2(L)  Phosphorus - (None) (None) (None) (None)  Creatinine 0.61 - 1.24 mg/dL (None) 12.04(H) 10.50(H) 14.08(H)  AST 15 - 41 U/L (None) 17 (None) 24  Alk Phos 38 - 126 U/L (None) 55 (None) 54  Bilirubin 0.3 - 1.2 mg/dL (None) 0.4 (None) 0.7  Glucose 65 - 99 mg/dL 127(H) 95 239(H) 240(H)  Cholesterol - (None) (None) (None) (None)  HDL cholesterol - (None) (None) (None) (None)  Triglycerides - (None) (None) (None) (None)  LDL Direct - (None) (None) (None) (None)  LDL Calc - (None) (None) (None) (None)  Total protein 6.5 - 8.1 g/dL (None) 6.6 (None) 6.2(L)  Albumin 3.5 - 5.0 g/dL (None) 3.3(L) (None) 3.3(L)   Lab Results  Component Value Date   TSH 2.466 (Test methodology is 3rd generation TSH) 12/17/2006   Lab Results  Component Value Date   BUN 44* 10/19/2014   Lab Results  Component Value Date   HGBA1C 6.7* 09/14/2014    Assessment/Plan  1. Essential hypertension Continue current medications - doxazosin (CARDURA) 4 MG tablet; Take 0.5 tablet  by mouth in the am and 0.5 tablet by mouth in the pm to regulate blood pressure.  Dispense: 90  tablet; Refill: 1  2. Type 2 diabetes mellitus with diabetic nephropathy, without long-term current use of insulin (HCC) Appears to be controlled  3. Anemia in chronic renal disease Advised patient this would be a persistent problem  4. Calculus of gallbladder without cholecystitis without obstruction Removed by cholecystectomy 10/21/2014  5. ESRD on dialysis ([place]) Continue  6. OSA on CPAP - Ambulatory referral to Sleep Studies  7. Memory loss Stable to slightly improved  8. SCC (squamous cell carcinoma), arm, left - Ambulatory referral to Dermatology

## 2014-11-10 DIAGNOSIS — N186 End stage renal disease: Secondary | ICD-10-CM | POA: Diagnosis not present

## 2014-11-10 DIAGNOSIS — N2581 Secondary hyperparathyroidism of renal origin: Secondary | ICD-10-CM | POA: Diagnosis not present

## 2014-11-10 DIAGNOSIS — E089 Diabetes mellitus due to underlying condition without complications: Secondary | ICD-10-CM | POA: Diagnosis not present

## 2014-11-10 DIAGNOSIS — D631 Anemia in chronic kidney disease: Secondary | ICD-10-CM | POA: Diagnosis not present

## 2014-11-12 DIAGNOSIS — N2581 Secondary hyperparathyroidism of renal origin: Secondary | ICD-10-CM | POA: Diagnosis not present

## 2014-11-12 DIAGNOSIS — D631 Anemia in chronic kidney disease: Secondary | ICD-10-CM | POA: Diagnosis not present

## 2014-11-12 DIAGNOSIS — N186 End stage renal disease: Secondary | ICD-10-CM | POA: Diagnosis not present

## 2014-11-12 DIAGNOSIS — E089 Diabetes mellitus due to underlying condition without complications: Secondary | ICD-10-CM | POA: Diagnosis not present

## 2014-11-15 DIAGNOSIS — N2581 Secondary hyperparathyroidism of renal origin: Secondary | ICD-10-CM | POA: Diagnosis not present

## 2014-11-15 DIAGNOSIS — D631 Anemia in chronic kidney disease: Secondary | ICD-10-CM | POA: Diagnosis not present

## 2014-11-15 DIAGNOSIS — N186 End stage renal disease: Secondary | ICD-10-CM | POA: Diagnosis not present

## 2014-11-15 DIAGNOSIS — E089 Diabetes mellitus due to underlying condition without complications: Secondary | ICD-10-CM | POA: Diagnosis not present

## 2014-11-17 DIAGNOSIS — N186 End stage renal disease: Secondary | ICD-10-CM | POA: Diagnosis not present

## 2014-11-17 DIAGNOSIS — N2581 Secondary hyperparathyroidism of renal origin: Secondary | ICD-10-CM | POA: Diagnosis not present

## 2014-11-17 DIAGNOSIS — D631 Anemia in chronic kidney disease: Secondary | ICD-10-CM | POA: Diagnosis not present

## 2014-11-17 DIAGNOSIS — E089 Diabetes mellitus due to underlying condition without complications: Secondary | ICD-10-CM | POA: Diagnosis not present

## 2014-11-19 DIAGNOSIS — N2581 Secondary hyperparathyroidism of renal origin: Secondary | ICD-10-CM | POA: Diagnosis not present

## 2014-11-19 DIAGNOSIS — N186 End stage renal disease: Secondary | ICD-10-CM | POA: Diagnosis not present

## 2014-11-19 DIAGNOSIS — D631 Anemia in chronic kidney disease: Secondary | ICD-10-CM | POA: Diagnosis not present

## 2014-11-19 DIAGNOSIS — E089 Diabetes mellitus due to underlying condition without complications: Secondary | ICD-10-CM | POA: Diagnosis not present

## 2014-11-22 DIAGNOSIS — N186 End stage renal disease: Secondary | ICD-10-CM | POA: Diagnosis not present

## 2014-11-22 DIAGNOSIS — T8612 Kidney transplant failure: Secondary | ICD-10-CM | POA: Diagnosis not present

## 2014-11-22 DIAGNOSIS — D631 Anemia in chronic kidney disease: Secondary | ICD-10-CM | POA: Diagnosis not present

## 2014-11-22 DIAGNOSIS — Z992 Dependence on renal dialysis: Secondary | ICD-10-CM | POA: Diagnosis not present

## 2014-11-22 DIAGNOSIS — N2581 Secondary hyperparathyroidism of renal origin: Secondary | ICD-10-CM | POA: Diagnosis not present

## 2014-11-22 DIAGNOSIS — E089 Diabetes mellitus due to underlying condition without complications: Secondary | ICD-10-CM | POA: Diagnosis not present

## 2014-11-24 DIAGNOSIS — N186 End stage renal disease: Secondary | ICD-10-CM | POA: Diagnosis not present

## 2014-11-24 DIAGNOSIS — D631 Anemia in chronic kidney disease: Secondary | ICD-10-CM | POA: Diagnosis not present

## 2014-11-24 DIAGNOSIS — E089 Diabetes mellitus due to underlying condition without complications: Secondary | ICD-10-CM | POA: Diagnosis not present

## 2014-11-24 DIAGNOSIS — N2581 Secondary hyperparathyroidism of renal origin: Secondary | ICD-10-CM | POA: Diagnosis not present

## 2014-11-25 DIAGNOSIS — L219 Seborrheic dermatitis, unspecified: Secondary | ICD-10-CM | POA: Diagnosis not present

## 2014-11-25 DIAGNOSIS — X32XXXA Exposure to sunlight, initial encounter: Secondary | ICD-10-CM | POA: Diagnosis not present

## 2014-11-25 DIAGNOSIS — L570 Actinic keratosis: Secondary | ICD-10-CM | POA: Diagnosis not present

## 2014-11-26 DIAGNOSIS — N186 End stage renal disease: Secondary | ICD-10-CM | POA: Diagnosis not present

## 2014-11-26 DIAGNOSIS — D631 Anemia in chronic kidney disease: Secondary | ICD-10-CM | POA: Diagnosis not present

## 2014-11-26 DIAGNOSIS — N2581 Secondary hyperparathyroidism of renal origin: Secondary | ICD-10-CM | POA: Diagnosis not present

## 2014-11-26 DIAGNOSIS — E089 Diabetes mellitus due to underlying condition without complications: Secondary | ICD-10-CM | POA: Diagnosis not present

## 2014-11-29 DIAGNOSIS — E089 Diabetes mellitus due to underlying condition without complications: Secondary | ICD-10-CM | POA: Diagnosis not present

## 2014-11-29 DIAGNOSIS — N186 End stage renal disease: Secondary | ICD-10-CM | POA: Diagnosis not present

## 2014-11-29 DIAGNOSIS — N2581 Secondary hyperparathyroidism of renal origin: Secondary | ICD-10-CM | POA: Diagnosis not present

## 2014-11-29 DIAGNOSIS — D631 Anemia in chronic kidney disease: Secondary | ICD-10-CM | POA: Diagnosis not present

## 2014-12-01 DIAGNOSIS — E089 Diabetes mellitus due to underlying condition without complications: Secondary | ICD-10-CM | POA: Diagnosis not present

## 2014-12-01 DIAGNOSIS — N186 End stage renal disease: Secondary | ICD-10-CM | POA: Diagnosis not present

## 2014-12-01 DIAGNOSIS — D631 Anemia in chronic kidney disease: Secondary | ICD-10-CM | POA: Diagnosis not present

## 2014-12-01 DIAGNOSIS — N2581 Secondary hyperparathyroidism of renal origin: Secondary | ICD-10-CM | POA: Diagnosis not present

## 2014-12-03 DIAGNOSIS — N186 End stage renal disease: Secondary | ICD-10-CM | POA: Diagnosis not present

## 2014-12-03 DIAGNOSIS — E089 Diabetes mellitus due to underlying condition without complications: Secondary | ICD-10-CM | POA: Diagnosis not present

## 2014-12-03 DIAGNOSIS — N2581 Secondary hyperparathyroidism of renal origin: Secondary | ICD-10-CM | POA: Diagnosis not present

## 2014-12-03 DIAGNOSIS — D631 Anemia in chronic kidney disease: Secondary | ICD-10-CM | POA: Diagnosis not present

## 2014-12-06 DIAGNOSIS — D631 Anemia in chronic kidney disease: Secondary | ICD-10-CM | POA: Diagnosis not present

## 2014-12-06 DIAGNOSIS — N186 End stage renal disease: Secondary | ICD-10-CM | POA: Diagnosis not present

## 2014-12-06 DIAGNOSIS — E089 Diabetes mellitus due to underlying condition without complications: Secondary | ICD-10-CM | POA: Diagnosis not present

## 2014-12-06 DIAGNOSIS — N2581 Secondary hyperparathyroidism of renal origin: Secondary | ICD-10-CM | POA: Diagnosis not present

## 2014-12-08 DIAGNOSIS — E089 Diabetes mellitus due to underlying condition without complications: Secondary | ICD-10-CM | POA: Diagnosis not present

## 2014-12-08 DIAGNOSIS — N186 End stage renal disease: Secondary | ICD-10-CM | POA: Diagnosis not present

## 2014-12-08 DIAGNOSIS — N2581 Secondary hyperparathyroidism of renal origin: Secondary | ICD-10-CM | POA: Diagnosis not present

## 2014-12-08 DIAGNOSIS — D631 Anemia in chronic kidney disease: Secondary | ICD-10-CM | POA: Diagnosis not present

## 2014-12-09 ENCOUNTER — Ambulatory Visit (INDEPENDENT_AMBULATORY_CARE_PROVIDER_SITE_OTHER): Payer: Medicare Other | Admitting: Neurology

## 2014-12-09 ENCOUNTER — Encounter: Payer: Self-pay | Admitting: Neurology

## 2014-12-09 VITALS — BP 130/70 | HR 88 | Resp 20 | Ht 67.5 in | Wt 232.0 lb

## 2014-12-09 DIAGNOSIS — G4737 Central sleep apnea in conditions classified elsewhere: Secondary | ICD-10-CM | POA: Diagnosis not present

## 2014-12-09 DIAGNOSIS — G4710 Hypersomnia, unspecified: Secondary | ICD-10-CM | POA: Diagnosis not present

## 2014-12-09 DIAGNOSIS — J4521 Mild intermittent asthma with (acute) exacerbation: Secondary | ICD-10-CM | POA: Diagnosis not present

## 2014-12-09 DIAGNOSIS — Z992 Dependence on renal dialysis: Secondary | ICD-10-CM

## 2014-12-09 DIAGNOSIS — N186 End stage renal disease: Secondary | ICD-10-CM | POA: Diagnosis not present

## 2014-12-09 DIAGNOSIS — E662 Morbid (severe) obesity with alveolar hypoventilation: Secondary | ICD-10-CM | POA: Diagnosis not present

## 2014-12-09 DIAGNOSIS — G4730 Sleep apnea, unspecified: Secondary | ICD-10-CM | POA: Diagnosis not present

## 2014-12-09 DIAGNOSIS — F1190 Opioid use, unspecified, uncomplicated: Secondary | ICD-10-CM | POA: Diagnosis not present

## 2014-12-09 DIAGNOSIS — J45909 Unspecified asthma, uncomplicated: Secondary | ICD-10-CM | POA: Insufficient documentation

## 2014-12-09 DIAGNOSIS — G4731 Primary central sleep apnea: Secondary | ICD-10-CM

## 2014-12-09 NOTE — Patient Instructions (Signed)
Sleep Apnea Sleep apnea is disorder that affects a person's sleep. A person with sleep apnea has abnormal pauses in their breathing when they sleep. It is hard for them to get a good sleep. This makes a person tired during the day. It also can lead to other physical problems. There are three types of sleep apnea. One type is when breathing stops for a short time because your airway is blocked (obstructive sleep apnea). Another type is when the brain sometimes fails to give the normal signal to breathe to the muscles that control your breathing (central sleep apnea). The third type is a combination of the other two types. HOME CARE  Do not sleep on your back. Try to sleep on your side.  Take all medicine as told by your doctor.  Avoid alcohol, calming medicines (sedatives), and depressant drugs.  Try to lose weight if you are overweight. Talk to your doctor about a healthy weight goal. Your doctor may have you use a device that helps to open your airway. It can help you get the air that you need. It is called a positive airway pressure (PAP) device. There are three types of PAP devices:  Continuous positive airway pressure (CPAP) device.  Nasal expiratory positive airway pressure (EPAP) device.  Bilevel positive airway pressure (BPAP) device. MAKE SURE YOU:  Understand these instructions.  Will watch your condition.  Will get help right away if you are not doing well or get worse.   This information is not intended to replace advice given to you by your health care provider. Make sure you discuss any questions you have with your health care provider.   Document Released: 10/18/2007 Document Revised: 01/29/2014 Document Reviewed: 05/12/2011 Elsevier Interactive Patient Education 2016 [employer]  

## 2014-12-09 NOTE — Progress Notes (Signed)
SLEEP MEDICINE CLINIC   Provider:  Larey [name], M D  Referring Provider: Estill Dooms, MD Primary Care Physician:  Kirk Dooms, MD  Chief Complaint  Patient presents with  . New Patient (Initial Visit)    pt on cpap since 2009, thinks he may need new study, hasn't worn cpap in the past 3 months, reports that his pressure is set at 15 cm H2O, uses [facility] for DME, rm 80, with wife    HPI:  Kirk [name] is a 54 y.o. male , seen here as a referral  from Kirk [name] for a sleep apnea re- evaluation.   Chief complaint according to patient : The patient reports that his CPAP has been broken and that it switches itself off within 10 or 15 minutes of initiating CPAP therapy he is in need of a new machine but he needs to be evaluated for his current level of apnea.  I have seen Kirk [name] in 2009 when he was referred by Kirk [name].  Kirk [name] is Filip[place] and has a history of hypertension, hyperlipidemia obstructive sleep apnea which was diagnosed on 06-25-07 with an AHI of 31. He was titrated to 15 cm water and the AHI was reduced to 4.0 but it did never become 0.0. He was placed on a nasal cushion mask in medium size. He had very variable heart rates during his sleep study considered brachy- tachycardia with arrhythmia. He has further complication underlying conditions of COPD, and asthma. He has seen pulmonology for these. His baseline sleep study from 06-25-07 also showed a supine accentuation of the AHI to 54.5 but no prolonged oxygen desaturations were noted.  In 2013 he followed Kirk [name] for recurrent episodes of black out.  Said he entertained the possibility that these were complex partial seizures. The staring episodes described improved on medication. He was also treated for depression at the time. He had a heart catheterization in 2013 which went well, he was on the waiting list for renal transplant in 2013. An EEG dated 11-28-10 showed mild left temporal cortical  irritability. The MRI from 5-30 1-12 was compared with another MRI study from November 2013  and showed tiny nonspecific subcortical white matter hyperintensities. There was also sinusitis noted. Transcranial Doppler studies showed mildly elevated velocities. At the time the patient was still smoking 4-5 cigarettes a day and drink caffeine but denied any alcohol or illicit drug use. The patient is on Hemodialysis.   Sleep habits are as follows: The patient takes his nighttime medicines at 11:30 PM and usually is in bed by midnight, will fall asleep within an hour but only stays asleep for 3 or 4 hours wakes up again and usually has trouble initiating sleep after 4 AM. He may sleep another one or 2 hours in the morning but this is not every night the case. His bedroom is cool, quiet and dark.  He rarely ever gets 6 hours of nocturnal sleep. He dreams vividly. Sometimes he acts out dreams in form of automatisms. His wife found him sitting in bed working on his cell phone but being obviously asleep and not responsive. He wakes up not restored and not refreshed, even with CPAP use he has a dry mouth. He does not report morning headaches. He sleeps on multiple pillows. He prefers sleeping on his side mostly on the right. When he wakes up he is most likely in supine sleep position. His wife has noted him snoring through the CPAP and  over the last 3 months he was snoring because the CPAP didn't work. She has noticed significant apnea as well. He is excessively daytime sleepy now. He frequently naps in daytime he actually has the irresistible urge to go to sleep is hard for him to fight this. If he is not physically active or stimulated, he will sleep.   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 His wife has seen him sleep walk twice in their marriage.  The patient is on Hemodialysis. He will have dry and wet days depending on the  spacing of his dialysis treatments. This will also influence his CPAP settings.  Social history: Disabled, married.  Review of Systems: Out of a complete 14 system review, the patient complains of only the following symptoms, and all other reviewed systems are negative. She reported sleep paralysis when waking in the morning isolated but recurrent. Sometimes he will wake from a nap without being oriented to his surroundings. He even sometimes dreams during nap times. He sometimes has dream intrusion into wakefulness. He cannot recall any cataplectic attacks.  Epworth score  17 , Fatigue severity score 59 , depression score 3   Social History   Social History  . Marital Status: Married    Spouse Name: N/A  . Number of Children: 3  . Years of Education: N/A   Occupational History  . disabled    Social History Main Topics  . Smoking status: Former Smoker -- 0.50 packs/day for 32 years    Types: Cigarettes    Quit date: 08/23/2014  . Smokeless tobacco: Never Used  . Alcohol Use: No  . Drug Use: No  . Sexual Activity: Yes   Other Topics Concern  . Not on file   Social History Narrative   Drinks 1 cup of caffeine daily.    Family History  Problem Relation Age of Onset  . Adopted: Yes  . Colon cancer Neg Hx   . Esophageal cancer Neg Hx   . Rectal cancer Neg Hx   . Stomach cancer Neg Hx     Past Medical History  Diagnosis Date  . Anxiety   . Chronic headaches   . HTN (hypertension)   . Shortness of breath   . Impotence of organic origin   . Other malaise and fatigue   . Debility, unspecified   . Cellulitis and abscess of trunk   . Kidney replaced by transplant   . Acute edema of lung, unspecified   . Unspecified constipation   . Other chronic nonalcoholic liver disease   . Localization-related (focal) (partial) epilepsy and epileptic syndromes with complex partial seizures, without mention of intractable epilepsy   . Tension headache   . Dermatophytosis of the  body   . Gout, unspecified   . Pain in joint, upper arm   . Pain in joint, lower leg   . Other nonspecific abnormal serum enzyme levels   . Essential hypertension, benign   . Acute, but ill-defined, cerebrovascular disease ([place])   . Memory loss   . Hypotension, unspecified   . Hypertrophy of prostate without urinary obstruction and other lower urinary tract symptoms (LUTS)   . Renal dialysis status(V45.11) 02/05/2010    restarted 01/02/13 ofter renal trransplant failure  . Insomnia, unspecified   . Secondary hyperparathyroidism (of renal origin)   . Lumbago   . Unspecified vitamin D deficiency   . Carpal tunnel syndrome   .  Unspecified hereditary and idiopathic peripheral neuropathy   . Other and unspecified hyperlipidemia   . Unspecified essential hypertension   . Edema   . Cholelithiasis 07/13/2014  . Dysrhythmia     history of  . Asthma   . End stage renal disease on dialysis [facility])     "MWF; [facility]" (10/21/2014)  . 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)   . OSA on CPAP   . Pneumonia "several times"    Past Surgical History  Procedure Laterality Date  . 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 Colonoscopy    . Back surgery    . Kidney transplant  08/17/2011    [facility]   . Av fistula placement Left ?2010    "forearm; at [facility]"  . Left heart catheterization with coronary angiogram N/A 03/21/2011    Procedure: LEFT HEART CATHETERIZATION WITH CORONARY ANGIOGRAM;  Surgeon: [name], MD;  Location: [facility] CATH LAB;  Service: Cardiovascular;  Laterality: N/A;  . Posterior fusion cervical spine  06/25/2012    for spinal stenosis  . Nephrectomy  08/2013    removed transplaned kidney  . Cardiac catheterization  03/21/2011  . Laparoscopic cholecystectomy  10/21/2014    w/IOC  . Vasectomy  2010  . Cholecystectomy N/A 10/21/2014    Procedure: LAPAROSCOPIC CHOLECYSTECTOMY WITH INTRAOPERATIVE CHOLANGIOGRAM;  Surgeon: Autumn Messing III, MD;  Location: [place];  Service: General;  Laterality: N/A;    Current Outpatient Prescriptions  Medication Sig Dispense Refill  . acetaminophen (TYLENOL) 500 MG tablet Take 1 tablet (500 mg total) by mouth every 6 (six) hours as needed for moderate pain.    Marland Kitchen amLODipine (NORVASC) 10 MG tablet Take 1 tablet (10 mg total) by mouth daily. 90 tablet 1  . aspirin EC 81 MG tablet Take 81 mg by mouth daily.    Marland Kitchen b complex-vitamin c-folic acid (NEPHRO-VITE) 0.8 MG TABS tablet Take 1 tablet by mouth daily. 90 tablet 3  . budesonide-formoterol (SYMBICORT) 80-4.5 MCG/ACT inhaler Inhale 2 puffs into the lungs 2 (two) times daily. 1 Inhaler 1  . calcitRIOL (ROCALTROL) 0.25 MCG capsule Take 1 capsule (0.25 mcg total) by mouth every Monday, Wednesday, and Friday with hemodialysis. 30 capsule 1  . calcium acetate, Phos Binder, (PHOSLYRA) 667 MG/5ML SOLN Take 2,001 mg by mouth 3 (three) times daily with meals. And 667 mg with snack    . carvedilol (COREG) 25 MG tablet Take 1 tablet (25 mg total) by mouth 2 (two) times daily. Take one tablet in the morning and one tablet in the evening 180 tablet 1  . Cholecalciferol (VITAMIN D3) 5000 UNITS TABS Take 1 tablet by mouth daily.    . diphenhydrAMINE (BENADRYL) 25 MG tablet Take 50 mg by mouth 2 (two) times daily.     Marland Kitchen doxazosin (CARDURA) 4 MG tablet Take 0.5 tablet  by mouth in the am and 0.5 tablet by mouth in the pm to regulate blood pressure. 90 tablet 1  . guaiFENesin-dextromethorphan (ROBITUSSIN DM) 100-10 MG/5ML syrup Take 10 mLs by mouth every 4 (four) hours as needed for cough.    Marland Kitchen oxyCODONE-acetaminophen (PERCOCET/ROXICET) 5-325 MG per tablet Take 1-2 tablets by mouth every 8 (eight) hours as needed for severe pain. 14 tablet 0  . pravastatin (PRAVACHOL) 40 MG tablet Take 1 tablet (40 mg total) by mouth every evening. 90 tablet 1  . promethazine (PHENERGAN) 25 MG tablet Take 1 tablet (25 mg total) by mouth  every 8 (eight) hours as needed for nausea  or vomiting. 90 tablet 1  . traMADol (ULTRAM) 50 MG tablet One up to 4 times daly if needed for pain (Patient taking differently: Take 50 mg by mouth every 6 (six) hours as needed. One up to 4 times daly if needed for pain) 100 tablet 3   No current facility-administered medications for this visit.    Allergies as of 12/09/2014 - Review Complete 12/09/2014  Allergen Reaction Noted  . Codeine Nausea And Vomiting and Nausea Only 05/20/2007    Vitals: BP 130/70 mmHg  Pulse 88  Resp 20  Ht 5' 7.5" (1.715 m)  Wt 232 lb (105.235 kg)  BMI 35.78 kg/m2 Last Weight:  Wt Readings from Last 1 Encounters:  12/09/14 232 lb (105.235 kg)   [id number] mass index is 35.78 kg/(m^2).     Last Height:   Ht Readings from Last 1 Encounters:  12/09/14 5' 7.5" (1.715 m)    Physical exam:  General: The patient is awake, alert and appears not in acute distress. The patient is poorly  groomed. Head: Normocephalic, atraumatic. Neck is supple. Mallampati 4,   neck circumference: 17 inches . Nasal airflow restricted ,. Retrognathia is not seen.  Cardiovascular:  Left arm arteriovenous fistula for hemodialysis access. The left hand has become more numb mesh and weaker Regular rate and rhythm , without  murmurs or carotid bruit, and without distended neck veins. Respiratory: Lungs are clear to auscultation. Skin:  Without evidence of edema, or rash Trunk: BMI is elevated . The patient's posture is erect   Neurologic exam : The patient is awake and alert, oriented to place and time.   Memory subjective  described as intact.  M Attention span & concentration ability appears normal.  Speech is fluent,  without dysarthria, dysphonia or aphasia.  Mood and affect are appropriate.  Cranial nerves: Pupils are equal and briskly reactive to light.  Extraocular movements  in vertical and horizontal planes intact and without nystagmus. Visual fields by finger perimetry are intact. Hearing to finger rub intact.    Facial sensation intact to fine touch.  Facial motor strength is symmetric and tongue and uvula move midline. Shoulder shrug was symmetrical.   Motor exam:  Normal tone, muscle bulk and symmetric strength in all extremities. The left hand is weaker and numb related to AVF.   Sensory:  Fine touch, pinprick and vibration and proprioception were tested in the upper extremities, and  normal.  Coordination: Rapid alternating movements in the fingers/hands was normal.  Finger-to-nose maneuver  normal without evidence of ataxia, dysmetria or tremor. The patient is on Hemodialysis.   Gait and station: Patient walks without assistive device and is able unassisted to climb up to the exam table. Strength within normal limits.  Stance is stable and normal.  Toe and heel stand were tested . Tandem gait is  Mildly fragmented. Turns with  4   Steps. Romberg testing is negative.  Deep tendon reflexes: in the  upper and lower extremities are symmetric and intact. Babinski maneuver response is   downgoing.  The patient was advised of the nature of the diagnosed sleep disorder , the treatment options and risks for general a health and wellness arising from not treating the condition.  I spent more than 50 minutes of face to face time with the patient. Greater than 50% of time was spent in counseling and coordination of care. We have discussed the diagnosis and differential and I answered  the patient's questions.     Assessment:  After physical and neurologic examination, review of laboratory studies,  Personal review of imaging studies, reports of other /same  Imaging studies ,  Results of polysomnography/ neurophysiology testing and pre-existing records as far as provided in visit., my assessment is   Mr. Goicoechea is a patient with end-stage renal disease on hemodialysis.  1) he has a known condition of obstructive sleep apnea but his current CPAP settings have not served him well I was able to access date back  from July and August of this year before his CPAP machine broke. Also he was compliant with the CPAP use for over 5 hours and 10 minutes per day he had a residual AHI of 40.8. Interestingly his CPAP machine could not give answers as to central or obstructive apneas being present and just listed these as unknown. Given this result I do not even sure that the CPAP was beneficial for him over the last couple of months.  2) since Mr. Fanta is a patient with end-stage renal disease on hemodialysis it would be important for him to have a adjustable pressure a so-called variable positive airway pressure machine. Since his current Pap machine has to be replaced anyway, I will order a split night polysomnography for him with the goal of establishing him on either auto BiPAP or VPAP. These modalities allow different pressures for positive airway night by night. They will set the pressure needed by the patient after a 40 minute observation period and as a hemodialysis patient his days after hemodialysis will require less pressure than days when he runs " wet".   3) Mr. Rehmer has multiple comorbidities which also have led to his end-stage renal disease. Hypertension, high postcoital still anemia, he is on a transplant list he had a cardiac cath in 2013, he has  asthma. Dr. Melvyn Novas reevaluated him and eliminated the diagnosis of COPD.  4) obesity - and risk factor for obstructive sleep apnea but the patient's end-stage renal disease places him at a high risk for central sleep apnea depending on his blood pH.    Plan:  Treatment plan and additional workup :  We will order a split night polysomnography since the patient is extremely and excessively daytime sleepy I would like for him to be an early patient in the sleep lab. My goal is to get as many observational sleep hours as possible , thus  allowing [place]  to split but also to try different modalities perhaps in the same night.  The patient would be considered an  urgent patient to be worked in.    Asencion Partridge Rayel Santizo MD  12/09/2014  CC Kirk [name].  CC: Kirk Dooms, Md [phone] [address] [place], [place] [address]

## 2014-12-10 DIAGNOSIS — D631 Anemia in chronic kidney disease: Secondary | ICD-10-CM | POA: Diagnosis not present

## 2014-12-10 DIAGNOSIS — N186 End stage renal disease: Secondary | ICD-10-CM | POA: Diagnosis not present

## 2014-12-10 DIAGNOSIS — N2581 Secondary hyperparathyroidism of renal origin: Secondary | ICD-10-CM | POA: Diagnosis not present

## 2014-12-10 DIAGNOSIS — E089 Diabetes mellitus due to underlying condition without complications: Secondary | ICD-10-CM | POA: Diagnosis not present

## 2014-12-12 DIAGNOSIS — D631 Anemia in chronic kidney disease: Secondary | ICD-10-CM | POA: Diagnosis not present

## 2014-12-12 DIAGNOSIS — N186 End stage renal disease: Secondary | ICD-10-CM | POA: Diagnosis not present

## 2014-12-12 DIAGNOSIS — N2581 Secondary hyperparathyroidism of renal origin: Secondary | ICD-10-CM | POA: Diagnosis not present

## 2014-12-12 DIAGNOSIS — E089 Diabetes mellitus due to underlying condition without complications: Secondary | ICD-10-CM | POA: Diagnosis not present

## 2014-12-14 DIAGNOSIS — N2581 Secondary hyperparathyroidism of renal origin: Secondary | ICD-10-CM | POA: Diagnosis not present

## 2014-12-14 DIAGNOSIS — E089 Diabetes mellitus due to underlying condition without complications: Secondary | ICD-10-CM | POA: Diagnosis not present

## 2014-12-14 DIAGNOSIS — D631 Anemia in chronic kidney disease: Secondary | ICD-10-CM | POA: Diagnosis not present

## 2014-12-14 DIAGNOSIS — N186 End stage renal disease: Secondary | ICD-10-CM | POA: Diagnosis not present

## 2014-12-17 DIAGNOSIS — N186 End stage renal disease: Secondary | ICD-10-CM | POA: Diagnosis not present

## 2014-12-17 DIAGNOSIS — E089 Diabetes mellitus due to underlying condition without complications: Secondary | ICD-10-CM | POA: Diagnosis not present

## 2014-12-17 DIAGNOSIS — D631 Anemia in chronic kidney disease: Secondary | ICD-10-CM | POA: Diagnosis not present

## 2014-12-17 DIAGNOSIS — N2581 Secondary hyperparathyroidism of renal origin: Secondary | ICD-10-CM | POA: Diagnosis not present

## 2014-12-20 DIAGNOSIS — N186 End stage renal disease: Secondary | ICD-10-CM | POA: Diagnosis not present

## 2014-12-20 DIAGNOSIS — D631 Anemia in chronic kidney disease: Secondary | ICD-10-CM | POA: Diagnosis not present

## 2014-12-20 DIAGNOSIS — E089 Diabetes mellitus due to underlying condition without complications: Secondary | ICD-10-CM | POA: Diagnosis not present

## 2014-12-20 DIAGNOSIS — N2581 Secondary hyperparathyroidism of renal origin: Secondary | ICD-10-CM | POA: Diagnosis not present

## 2014-12-22 DIAGNOSIS — T8612 Kidney transplant failure: Secondary | ICD-10-CM | POA: Diagnosis not present

## 2014-12-22 DIAGNOSIS — N186 End stage renal disease: Secondary | ICD-10-CM | POA: Diagnosis not present

## 2014-12-22 DIAGNOSIS — N2581 Secondary hyperparathyroidism of renal origin: Secondary | ICD-10-CM | POA: Diagnosis not present

## 2014-12-22 DIAGNOSIS — D631 Anemia in chronic kidney disease: Secondary | ICD-10-CM | POA: Diagnosis not present

## 2014-12-22 DIAGNOSIS — Z992 Dependence on renal dialysis: Secondary | ICD-10-CM | POA: Diagnosis not present

## 2014-12-22 DIAGNOSIS — E089 Diabetes mellitus due to underlying condition without complications: Secondary | ICD-10-CM | POA: Diagnosis not present

## 2014-12-24 DIAGNOSIS — N186 End stage renal disease: Secondary | ICD-10-CM | POA: Diagnosis not present

## 2014-12-24 DIAGNOSIS — N2581 Secondary hyperparathyroidism of renal origin: Secondary | ICD-10-CM | POA: Diagnosis not present

## 2014-12-24 DIAGNOSIS — E089 Diabetes mellitus due to underlying condition without complications: Secondary | ICD-10-CM | POA: Diagnosis not present

## 2014-12-27 ENCOUNTER — Ambulatory Visit (INDEPENDENT_AMBULATORY_CARE_PROVIDER_SITE_OTHER): Payer: Medicare Other | Admitting: Neurology

## 2014-12-27 DIAGNOSIS — N186 End stage renal disease: Secondary | ICD-10-CM

## 2014-12-27 DIAGNOSIS — G4731 Primary central sleep apnea: Secondary | ICD-10-CM | POA: Diagnosis not present

## 2014-12-27 DIAGNOSIS — N2581 Secondary hyperparathyroidism of renal origin: Secondary | ICD-10-CM | POA: Diagnosis not present

## 2014-12-27 DIAGNOSIS — E089 Diabetes mellitus due to underlying condition without complications: Secondary | ICD-10-CM | POA: Diagnosis not present

## 2014-12-27 DIAGNOSIS — E662 Morbid (severe) obesity with alveolar hypoventilation: Secondary | ICD-10-CM

## 2014-12-27 DIAGNOSIS — Z992 Dependence on renal dialysis: Secondary | ICD-10-CM

## 2014-12-28 NOTE — Sleep Study (Signed)
Please see the scanned sleep study interpretation located in the Procedure tab within the Chart Review section. 

## 2014-12-29 ENCOUNTER — Telehealth: Payer: Self-pay

## 2014-12-29 DIAGNOSIS — N2581 Secondary hyperparathyroidism of renal origin: Secondary | ICD-10-CM | POA: Diagnosis not present

## 2014-12-29 DIAGNOSIS — N186 End stage renal disease: Secondary | ICD-10-CM | POA: Diagnosis not present

## 2014-12-29 DIAGNOSIS — E089 Diabetes mellitus due to underlying condition without complications: Secondary | ICD-10-CM | POA: Diagnosis not present

## 2014-12-29 DIAGNOSIS — G4733 Obstructive sleep apnea (adult) (pediatric): Secondary | ICD-10-CM

## 2014-12-29 NOTE — Telephone Encounter (Signed)
Called pt to discuss sleep study results.No answer. Left a message asking him to call me back.

## 2014-12-31 DIAGNOSIS — N186 End stage renal disease: Secondary | ICD-10-CM | POA: Diagnosis not present

## 2014-12-31 DIAGNOSIS — N2581 Secondary hyperparathyroidism of renal origin: Secondary | ICD-10-CM | POA: Diagnosis not present

## 2014-12-31 DIAGNOSIS — E089 Diabetes mellitus due to underlying condition without complications: Secondary | ICD-10-CM | POA: Diagnosis not present

## 2015-01-03 DIAGNOSIS — N2581 Secondary hyperparathyroidism of renal origin: Secondary | ICD-10-CM | POA: Diagnosis not present

## 2015-01-03 DIAGNOSIS — N186 End stage renal disease: Secondary | ICD-10-CM | POA: Diagnosis not present

## 2015-01-03 DIAGNOSIS — E089 Diabetes mellitus due to underlying condition without complications: Secondary | ICD-10-CM | POA: Diagnosis not present

## 2015-01-03 NOTE — Telephone Encounter (Signed)
Spoke to pt's wife (per DPR in 2015 that says that he authorizes all [facility] practices to speak with wife), and advised her that pt's sleep study was reviewed and Dr. Brett Fairy recommends starting a CPAP at 12 cm H20. Pt's wife says they are willing to proceed with CPAP. They already use [facility]. I advised pt's wife that I will send an order to [facility] for pt's cpap machine. A follow up appt was made for 2/14 at 3:30 and she was advised to have the pt bring his cpap to this appt. Pt's wife verbalized understanding.

## 2015-01-03 NOTE — Telephone Encounter (Signed)
Wife Mechele Claude returned Kirk [name]'s call

## 2015-01-05 DIAGNOSIS — N186 End stage renal disease: Secondary | ICD-10-CM | POA: Diagnosis not present

## 2015-01-05 DIAGNOSIS — N2581 Secondary hyperparathyroidism of renal origin: Secondary | ICD-10-CM | POA: Diagnosis not present

## 2015-01-05 DIAGNOSIS — E089 Diabetes mellitus due to underlying condition without complications: Secondary | ICD-10-CM | POA: Diagnosis not present

## 2015-01-07 DIAGNOSIS — N186 End stage renal disease: Secondary | ICD-10-CM | POA: Diagnosis not present

## 2015-01-07 DIAGNOSIS — E089 Diabetes mellitus due to underlying condition without complications: Secondary | ICD-10-CM | POA: Diagnosis not present

## 2015-01-07 DIAGNOSIS — N2581 Secondary hyperparathyroidism of renal origin: Secondary | ICD-10-CM | POA: Diagnosis not present

## 2015-01-10 DIAGNOSIS — E089 Diabetes mellitus due to underlying condition without complications: Secondary | ICD-10-CM | POA: Diagnosis not present

## 2015-01-10 DIAGNOSIS — N186 End stage renal disease: Secondary | ICD-10-CM | POA: Diagnosis not present

## 2015-01-10 DIAGNOSIS — N2581 Secondary hyperparathyroidism of renal origin: Secondary | ICD-10-CM | POA: Diagnosis not present

## 2015-01-12 DIAGNOSIS — N186 End stage renal disease: Secondary | ICD-10-CM | POA: Diagnosis not present

## 2015-01-12 DIAGNOSIS — E089 Diabetes mellitus due to underlying condition without complications: Secondary | ICD-10-CM | POA: Diagnosis not present

## 2015-01-12 DIAGNOSIS — N2581 Secondary hyperparathyroidism of renal origin: Secondary | ICD-10-CM | POA: Diagnosis not present

## 2015-01-14 DIAGNOSIS — N2581 Secondary hyperparathyroidism of renal origin: Secondary | ICD-10-CM | POA: Diagnosis not present

## 2015-01-14 DIAGNOSIS — E089 Diabetes mellitus due to underlying condition without complications: Secondary | ICD-10-CM | POA: Diagnosis not present

## 2015-01-14 DIAGNOSIS — N186 End stage renal disease: Secondary | ICD-10-CM | POA: Diagnosis not present

## 2015-01-17 DIAGNOSIS — E089 Diabetes mellitus due to underlying condition without complications: Secondary | ICD-10-CM | POA: Diagnosis not present

## 2015-01-17 DIAGNOSIS — N2581 Secondary hyperparathyroidism of renal origin: Secondary | ICD-10-CM | POA: Diagnosis not present

## 2015-01-17 DIAGNOSIS — N186 End stage renal disease: Secondary | ICD-10-CM | POA: Diagnosis not present

## 2015-01-19 DIAGNOSIS — N186 End stage renal disease: Secondary | ICD-10-CM | POA: Diagnosis not present

## 2015-01-19 DIAGNOSIS — N2581 Secondary hyperparathyroidism of renal origin: Secondary | ICD-10-CM | POA: Diagnosis not present

## 2015-01-19 DIAGNOSIS — E089 Diabetes mellitus due to underlying condition without complications: Secondary | ICD-10-CM | POA: Diagnosis not present

## 2015-01-21 DIAGNOSIS — E089 Diabetes mellitus due to underlying condition without complications: Secondary | ICD-10-CM | POA: Diagnosis not present

## 2015-01-21 DIAGNOSIS — N186 End stage renal disease: Secondary | ICD-10-CM | POA: Diagnosis not present

## 2015-01-21 DIAGNOSIS — N2581 Secondary hyperparathyroidism of renal origin: Secondary | ICD-10-CM | POA: Diagnosis not present

## 2015-01-22 DIAGNOSIS — Z992 Dependence on renal dialysis: Secondary | ICD-10-CM | POA: Diagnosis not present

## 2015-01-22 DIAGNOSIS — T8612 Kidney transplant failure: Secondary | ICD-10-CM | POA: Diagnosis not present

## 2015-01-22 DIAGNOSIS — N186 End stage renal disease: Secondary | ICD-10-CM | POA: Diagnosis not present

## 2015-01-24 DIAGNOSIS — E089 Diabetes mellitus due to underlying condition without complications: Secondary | ICD-10-CM | POA: Diagnosis not present

## 2015-01-24 DIAGNOSIS — N2581 Secondary hyperparathyroidism of renal origin: Secondary | ICD-10-CM | POA: Diagnosis not present

## 2015-01-24 DIAGNOSIS — D509 Iron deficiency anemia, unspecified: Secondary | ICD-10-CM | POA: Diagnosis not present

## 2015-01-24 DIAGNOSIS — D631 Anemia in chronic kidney disease: Secondary | ICD-10-CM | POA: Diagnosis not present

## 2015-01-24 DIAGNOSIS — N186 End stage renal disease: Secondary | ICD-10-CM | POA: Diagnosis not present

## 2015-01-26 DIAGNOSIS — D509 Iron deficiency anemia, unspecified: Secondary | ICD-10-CM | POA: Diagnosis not present

## 2015-01-26 DIAGNOSIS — E089 Diabetes mellitus due to underlying condition without complications: Secondary | ICD-10-CM | POA: Diagnosis not present

## 2015-01-26 DIAGNOSIS — N186 End stage renal disease: Secondary | ICD-10-CM | POA: Diagnosis not present

## 2015-01-26 DIAGNOSIS — D631 Anemia in chronic kidney disease: Secondary | ICD-10-CM | POA: Diagnosis not present

## 2015-01-26 DIAGNOSIS — N2581 Secondary hyperparathyroidism of renal origin: Secondary | ICD-10-CM | POA: Diagnosis not present

## 2015-01-28 DIAGNOSIS — N186 End stage renal disease: Secondary | ICD-10-CM | POA: Diagnosis not present

## 2015-01-28 DIAGNOSIS — E089 Diabetes mellitus due to underlying condition without complications: Secondary | ICD-10-CM | POA: Diagnosis not present

## 2015-01-28 DIAGNOSIS — D631 Anemia in chronic kidney disease: Secondary | ICD-10-CM | POA: Diagnosis not present

## 2015-01-28 DIAGNOSIS — D509 Iron deficiency anemia, unspecified: Secondary | ICD-10-CM | POA: Diagnosis not present

## 2015-01-28 DIAGNOSIS — N2581 Secondary hyperparathyroidism of renal origin: Secondary | ICD-10-CM | POA: Diagnosis not present

## 2015-01-31 DIAGNOSIS — D509 Iron deficiency anemia, unspecified: Secondary | ICD-10-CM | POA: Diagnosis not present

## 2015-01-31 DIAGNOSIS — D631 Anemia in chronic kidney disease: Secondary | ICD-10-CM | POA: Diagnosis not present

## 2015-01-31 DIAGNOSIS — E089 Diabetes mellitus due to underlying condition without complications: Secondary | ICD-10-CM | POA: Diagnosis not present

## 2015-01-31 DIAGNOSIS — N2581 Secondary hyperparathyroidism of renal origin: Secondary | ICD-10-CM | POA: Diagnosis not present

## 2015-01-31 DIAGNOSIS — N186 End stage renal disease: Secondary | ICD-10-CM | POA: Diagnosis not present

## 2015-02-02 DIAGNOSIS — E089 Diabetes mellitus due to underlying condition without complications: Secondary | ICD-10-CM | POA: Diagnosis not present

## 2015-02-02 DIAGNOSIS — D509 Iron deficiency anemia, unspecified: Secondary | ICD-10-CM | POA: Diagnosis not present

## 2015-02-02 DIAGNOSIS — D631 Anemia in chronic kidney disease: Secondary | ICD-10-CM | POA: Diagnosis not present

## 2015-02-02 DIAGNOSIS — N186 End stage renal disease: Secondary | ICD-10-CM | POA: Diagnosis not present

## 2015-02-02 DIAGNOSIS — N2581 Secondary hyperparathyroidism of renal origin: Secondary | ICD-10-CM | POA: Diagnosis not present

## 2015-02-04 DIAGNOSIS — D631 Anemia in chronic kidney disease: Secondary | ICD-10-CM | POA: Diagnosis not present

## 2015-02-04 DIAGNOSIS — N2581 Secondary hyperparathyroidism of renal origin: Secondary | ICD-10-CM | POA: Diagnosis not present

## 2015-02-04 DIAGNOSIS — N186 End stage renal disease: Secondary | ICD-10-CM | POA: Diagnosis not present

## 2015-02-04 DIAGNOSIS — D509 Iron deficiency anemia, unspecified: Secondary | ICD-10-CM | POA: Diagnosis not present

## 2015-02-04 DIAGNOSIS — E089 Diabetes mellitus due to underlying condition without complications: Secondary | ICD-10-CM | POA: Diagnosis not present

## 2015-02-07 DIAGNOSIS — D509 Iron deficiency anemia, unspecified: Secondary | ICD-10-CM | POA: Diagnosis not present

## 2015-02-07 DIAGNOSIS — E089 Diabetes mellitus due to underlying condition without complications: Secondary | ICD-10-CM | POA: Diagnosis not present

## 2015-02-07 DIAGNOSIS — N186 End stage renal disease: Secondary | ICD-10-CM | POA: Diagnosis not present

## 2015-02-07 DIAGNOSIS — D631 Anemia in chronic kidney disease: Secondary | ICD-10-CM | POA: Diagnosis not present

## 2015-02-07 DIAGNOSIS — N2581 Secondary hyperparathyroidism of renal origin: Secondary | ICD-10-CM | POA: Diagnosis not present

## 2015-02-08 ENCOUNTER — Encounter: Payer: Self-pay | Admitting: Internal Medicine

## 2015-02-08 ENCOUNTER — Ambulatory Visit (INDEPENDENT_AMBULATORY_CARE_PROVIDER_SITE_OTHER): Payer: Medicare Other | Admitting: Internal Medicine

## 2015-02-08 VITALS — BP 140/80 | HR 94 | Temp 98.2°F | Resp 20 | Ht 68.0 in | Wt 237.4 lb

## 2015-02-08 DIAGNOSIS — N186 End stage renal disease: Secondary | ICD-10-CM

## 2015-02-08 DIAGNOSIS — G4733 Obstructive sleep apnea (adult) (pediatric): Secondary | ICD-10-CM

## 2015-02-08 DIAGNOSIS — E1121 Type 2 diabetes mellitus with diabetic nephropathy: Secondary | ICD-10-CM | POA: Diagnosis not present

## 2015-02-08 DIAGNOSIS — I10 Essential (primary) hypertension: Secondary | ICD-10-CM | POA: Diagnosis not present

## 2015-02-08 DIAGNOSIS — G4710 Hypersomnia, unspecified: Secondary | ICD-10-CM | POA: Diagnosis not present

## 2015-02-08 DIAGNOSIS — K8020 Calculus of gallbladder without cholecystitis without obstruction: Secondary | ICD-10-CM | POA: Diagnosis not present

## 2015-02-08 DIAGNOSIS — Z992 Dependence on renal dialysis: Secondary | ICD-10-CM

## 2015-02-08 DIAGNOSIS — R413 Other amnesia: Secondary | ICD-10-CM

## 2015-02-08 DIAGNOSIS — Z9989 Dependence on other enabling machines and devices: Secondary | ICD-10-CM

## 2015-02-08 DIAGNOSIS — R0600 Dyspnea, unspecified: Secondary | ICD-10-CM | POA: Diagnosis not present

## 2015-02-08 DIAGNOSIS — G4731 Primary central sleep apnea: Secondary | ICD-10-CM

## 2015-02-08 DIAGNOSIS — G4730 Sleep apnea, unspecified: Secondary | ICD-10-CM

## 2015-02-08 DIAGNOSIS — E785 Hyperlipidemia, unspecified: Secondary | ICD-10-CM | POA: Diagnosis not present

## 2015-02-08 DIAGNOSIS — G44209 Tension-type headache, unspecified, not intractable: Secondary | ICD-10-CM

## 2015-02-08 DIAGNOSIS — R609 Edema, unspecified: Secondary | ICD-10-CM

## 2015-02-08 MED ORDER — OXYCODONE-ACETAMINOPHEN 5-325 MG PO TABS: 1.0000 | ORAL_TABLET | Freq: Three times a day (TID) | ORAL | Status: DC | PRN

## 2015-02-08 MED ORDER — AMLODIPINE BESYLATE 10 MG PO TABS: 10.0000 mg | ORAL_TABLET | Freq: Every day | ORAL | Status: DC

## 2015-02-08 MED ORDER — PRAVASTATIN SODIUM 40 MG PO TABS: 40.0000 mg | ORAL_TABLET | Freq: Every evening | ORAL | Status: DC

## 2015-02-08 MED ORDER — NEPHRO-VITE 0.8 MG PO TABS: 1.0000 | ORAL_TABLET | Freq: Every day | ORAL | Status: DC

## 2015-02-08 MED ORDER — CARVEDILOL 25 MG PO TABS: 25.0000 mg | ORAL_TABLET | Freq: Two times a day (BID) | ORAL | Status: DC

## 2015-02-08 MED ORDER — DOXAZOSIN MESYLATE 4 MG PO TABS: ORAL_TABLET | ORAL | Status: DC

## 2015-02-08 NOTE — Progress Notes (Signed)
Patient ID: Kirk [name], male   DOB: [birth year], 55 y.o.   MRN: [id number]    Facility  [facility]    Place of Service:   OFFICE    Allergies  Allergen Reactions  . Codeine Nausea And Vomiting and Nausea Only    Chief Complaint  Patient presents with  . Medical Management of Chronic Issues    HPI:  Calculus of gallbladder without cholecystitis without obstruction - resolved. Status post laparoscopic cholecystectomy September 2016  ESRD on dialysis [facility]) - continues on 3 days per week dialysis. Remains on transplant list. Has to have 2 teeth pulled before further plans are made.  Essential hypertension - controlled  HLD (hyperlipidemia) - controlled on pravastatin (PRAVACHOL) 40 MG tablet  Hypersomnia with sleep apnea - continues to have issues with daytime sleepiness. He is not using CPAP at present, because the insurance company has not allowed replacement of his broken CPAP machine. He is to see Dr. Brett Fairy again seen in regards to his hypersomnia.  Type 2 diabetes mellitus with diabetic nephropathy, without long-term current use of insulin ([place]) - patient states diabetes under very good control. He is being checked regularly when he goes to dialysis.  Dyspnea - chronic shortness of breath with exertion  OSA on CPAP - hypersomnia with sleep apnea. Currently not using CPAP due to problems with insurance company. Wife says that she thinks this is straightened out so that he can get his machine soon.  Secondary central sleep apnea - followed by Dr. Brett Fairy -   Tension headache - improved   Edema, unspecified type - stable  -   Memory loss - unchanged      Medications: Patient's Medications  New Prescriptions   No medications on file  Previous Medications   ACETAMINOPHEN (TYLENOL) 500 MG TABLET    Take 1 tablet (500 mg total) by mouth every 6 (six) hours as needed for moderate pain.   ASPIRIN EC 81 MG TABLET    Take 81 mg by mouth daily.   BUDESONIDE-FORMOTEROL  (SYMBICORT) 80-4.5 MCG/ACT INHALER    Inhale 2 puffs into the lungs 2 (two) times daily.   CALCITRIOL (ROCALTROL) 0.25 MCG CAPSULE    Take 1 capsule (0.25 mcg total) by mouth every Monday, Wednesday, and Friday with hemodialysis.   CALCIUM ACETATE, PHOS BINDER, (PHOSLYRA) 667 MG/5ML SOLN    Take 2,001 mg by mouth 3 (three) times daily with meals. And 667 mg with snack   CHOLECALCIFEROL (VITAMIN D3) 5000 UNITS TABS    Take 1 tablet by mouth daily.   DIPHENHYDRAMINE (BENADRYL) 25 MG TABLET    Take 50 mg by mouth 2 (two) times daily.    GUAIFENESIN-DEXTROMETHORPHAN (ROBITUSSIN DM) 100-10 MG/5ML SYRUP    Take 10 mLs by mouth every 4 (four) hours as needed for cough.   PROMETHAZINE (PHENERGAN) 25 MG TABLET    Take 1 tablet (25 mg total) by mouth every 8 (eight) hours as needed for nausea or vomiting.   TRAMADOL (ULTRAM) 50 MG TABLET    One up to 4 times daly if needed for pain  Modified Medications   Modified Medication Previous Medication   AMLODIPINE (NORVASC) 10 MG TABLET amLODipine (NORVASC) 10 MG tablet      Take 1 tablet (10 mg total) by mouth daily.    Take 1 tablet (10 mg total) by mouth daily.   B COMPLEX-VITAMIN C-FOLIC ACID (NEPHRO-VITE) 0.8 MG TABS TABLET b complex-vitamin c-folic acid (NEPHRO-VITE) 0.8 MG TABS tablet  Take 1 tablet by mouth daily.    Take 1 tablet by mouth daily.   B COMPLEX-VITAMIN C-FOLIC ACID (NEPHRO-VITE) 0.8 MG TABS TABLET b complex-vitamin c-folic acid (NEPHRO-VITE) 0.8 MG TABS tablet      Take 1 tablet by mouth daily.    Take 1 tablet by mouth daily.   CARVEDILOL (COREG) 25 MG TABLET carvedilol (COREG) 25 MG tablet      Take 1 tablet (25 mg total) by mouth 2 (two) times daily. Take one tablet in the morning and one tablet in the evening    Take 1 tablet (25 mg total) by mouth 2 (two) times daily. Take one tablet in the morning and one tablet in the evening   DOXAZOSIN (CARDURA) 4 MG TABLET doxazosin (CARDURA) 4 MG tablet      Take 0.5 tablet  by mouth in the am  and 0.5 tablet by mouth in the pm to regulate blood pressure.    Take 0.5 tablet  by mouth in the am and 0.5 tablet by mouth in the pm to regulate blood pressure.   OXYCODONE-ACETAMINOPHEN (PERCOCET/ROXICET) 5-325 MG TABLET oxyCODONE-acetaminophen (PERCOCET/ROXICET) 5-325 MG per tablet      Take 1-2 tablets by mouth every 8 (eight) hours as needed for severe pain.    Take 1-2 tablets by mouth every 8 (eight) hours as needed for severe pain.   PRAVASTATIN (PRAVACHOL) 40 MG TABLET pravastatin (PRAVACHOL) 40 MG tablet      Take 1 tablet (40 mg total) by mouth every evening.    Take 1 tablet (40 mg total) by mouth every evening.  Discontinued Medications   No medications on file    Review of Systems  Constitutional: Negative for fever.       Chronically fatigued.  HENT: Positive for ear discharge and hearing loss. Negative for ear pain and tinnitus.   Eyes: Negative.   Respiratory: Negative for shortness of breath.        Dyspnea on exertion has improved. Pneumonia. Hospitalized 04/06/14 and 06/23/2014. Rehospitalized 09/18/2014 for increasing dyspnea felt secondary to acute exacerbation of his COPD plus fluid overload.  Cardiovascular: Negative for chest pain, palpitations and leg swelling.  Gastrointestinal: Negative for abdominal pain and abdominal distention.       Cholecystectomy 10/21/2014.  Endocrine:       Diabetes is under better control.  Genitourinary: Negative.        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  Musculoskeletal:       Neck discomfort. Chronic right knee discomfort. Chronic back pains. Unsteady gait.  Skin:       Tissue fibrosis at the laparoscopic cholecystectomy incisions in the right abdomen and maple.  Neurological: Positive for dizziness.       Memory impairment is stable. Feet feel numb. Recurrent headaches  Hematological:       Anemia of CKD.  Psychiatric/Behavioral: Positive for confusion and decreased  concentration.       Insomnia. Falls asleep in the day.    Filed Vitals:   02/08/15 1600  BP: 140/80  Pulse: 94  Temp: 98.2 F (36.8 C)  TempSrc: Oral  Resp: 20  Height: '5\' 8"'$  (1.727 m)  Weight: 237 lb 6.4 oz (107.684 kg)  SpO2: 96%   Body mass index is 36.1 kg/(m^2). Filed Weights   02/08/15 1600  Weight: 237 lb 6.4 oz (107.684 kg)     Physical Exam  Constitutional: He is oriented to person, place, and  time.  Obese.  HENT:  Loss of hearing.  Eyes:  Corrective lenses  Neck: No JVD present. No tracheal deviation present. No thyromegaly present.  Cardiovascular: Exam reveals no gallop.   No murmur heard. AF. 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  Abdominal: Soft. Bowel sounds are normal. He exhibits no distension and no mass. There is no tenderness.  Tender in the RUQ. Frequent nausea.  Musculoskeletal: Normal range of motion. He exhibits tenderness. He exhibits no edema.  Scar left knee. Tender right knee  Lymphadenopathy:    He has no cervical adenopathy.  Neurological: He is alert and oriented to person, place, and time. No cranial nerve deficit. Coordination normal.  Positive Tinel sign bilaterally  Skin: No rash noted. No erythema. No pallor.  Tissue fibrosis at cholecystectomy incisions in the right abdomen and umbilicus. Seborrheic nonhealing lesion of the left forearm. 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  Psychiatric:  Drowsy during the exam.    Labs reviewed: Lab Summary Latest Ref Rng 10/21/2014 10/19/2014 09/21/2014 09/20/2014  Hemoglobin 13.0 - 17.0 g/dL 8.3(O) 4.7(Y) (None) (None)  Hematocrit 39.0 - 52.0 % 25.0(L) 24.9(L) (None) (None)  White count 4.0 - 10.5 K/uL (None) 3.3(L) (None) (None)  Platelet count 150 - 400 K/uL (None) 116(L) (None) (None)  Sodium 135 - 145 mmol/L 140 137 137 133(L)  Potassium 3.5 - 5.1 mmol/L 3.7 3.9 4.2 5.4(H)  Calcium 8.9 - 10.3 mg/dL (None) 8.0(L) 8.5(L) 8.2(L)  Phosphorus - (None) (None) (None) (None)  Creatinine 0.61 -  1.24 mg/dL (None) 03.75(K) 42.37(G) 14.08(H)  AST 15 - 41 U/L (None) 17 (None) 24  Alk Phos 38 - 126 U/L (None) 55 (None) 54  Bilirubin 0.3 - 1.2 mg/dL (None) 0.4 (None) 0.7  Glucose 65 - 99 mg/dL 276(D) 95 704(U) 448(Z)  Cholesterol - (None) (None) (None) (None)  HDL cholesterol - (None) (None) (None) (None)  Triglycerides - (None) (None) (None) (None)  LDL Direct - (None) (None) (None) (None)  LDL Calc - (None) (None) (None) (None)  Total protein 6.5 - 8.1 g/dL (None) 6.6 (None) 6.2(L)  Albumin 3.5 - 5.0 g/dL (None) 3.3(L) (None) 3.3(L)   Lab Results  Component Value Date   TSH 2.466 *Test methodology is 3rd generation TSH* 12/17/2006   TSH 2.771 *Test methodology is 3rd generation TSH* 12/13/2006   Lab Results  Component Value Date   BUN 44* 10/19/2014   BUN 71* 09/21/2014   BUN 103* 09/20/2014   Lab Results  Component Value Date   HGBA1C 6.7* 09/14/2014   HGBA1C 6.0* 04/08/2014   HGBA1C 5.7* 11/26/2013    Assessment/Plan  1. Calculus of gallbladder without cholecystitis without obstruction Resolved  2. ESRD on dialysis (HCC) Continue 3 day per week dialysis - b complex-vitamin c-folic acid (NEPHRO-VITE) 0.8 MG TABS tablet; Take 1 tablet by mouth daily.  Dispense: 90 tablet; Refill: 3  3. Essential hypertension (NORVASC) 10 MG tablet; Take 1 tablet (10 mg total) by mouth daily.  Dispense: 90 tablet; Refill: 1 - carvedilol (COREG) 25 MG tablet; Take 1 tablet (25 mg total) by mouth 2 (two) times daily. Take one tablet in the morning and one tablet in the evening  Dispense: 180 tablet; Refill: 1 - doxazosin (CARDURA) 4 MG tablet; Take 0.5 tablet  by mouth in the am and 0.5 tablet by mouth in the pm to regulate blood pressure.  Dispense: 90 tablet; Refill: 1  4. HLD (hyperlipidemia) - pravastatin (PRAVACHOL) 40 MG tablet; Take 1 tablet (40 mg total) by  mouth every evening.  Dispense: 90 tablet; Refill: 1  5. Hypersomnia with sleep apnea Seeing Dr. Brett Fairy .Marland Kitchen Advised  patient and wife to get CPAP machine as directed .  6. Type 2 diabetes mellitus with diabetic nephropathy, without long-term current use of insulin (HCC) Controlled  7. DyspneaContinue to follow. No new orders.   8. OSA on CPAPCPAP machine as directed   9. Secondary central sleep apneaT Dr. Brett Fairy   10. Tension headache Improved   11. Edema, unspecified type Stable   12. Memory loss unchanged

## 2015-02-09 DIAGNOSIS — N186 End stage renal disease: Secondary | ICD-10-CM | POA: Diagnosis not present

## 2015-02-09 DIAGNOSIS — D631 Anemia in chronic kidney disease: Secondary | ICD-10-CM | POA: Diagnosis not present

## 2015-02-09 DIAGNOSIS — E089 Diabetes mellitus due to underlying condition without complications: Secondary | ICD-10-CM | POA: Diagnosis not present

## 2015-02-09 DIAGNOSIS — N2581 Secondary hyperparathyroidism of renal origin: Secondary | ICD-10-CM | POA: Diagnosis not present

## 2015-02-09 DIAGNOSIS — D509 Iron deficiency anemia, unspecified: Secondary | ICD-10-CM | POA: Diagnosis not present

## 2015-02-09 LAB — HEMOGLOBIN A1C: HEMOGLOBIN A1C: 6.5

## 2015-02-11 DIAGNOSIS — D509 Iron deficiency anemia, unspecified: Secondary | ICD-10-CM | POA: Diagnosis not present

## 2015-02-11 DIAGNOSIS — E089 Diabetes mellitus due to underlying condition without complications: Secondary | ICD-10-CM | POA: Diagnosis not present

## 2015-02-11 DIAGNOSIS — D631 Anemia in chronic kidney disease: Secondary | ICD-10-CM | POA: Diagnosis not present

## 2015-02-11 DIAGNOSIS — N2581 Secondary hyperparathyroidism of renal origin: Secondary | ICD-10-CM | POA: Diagnosis not present

## 2015-02-11 DIAGNOSIS — N186 End stage renal disease: Secondary | ICD-10-CM | POA: Diagnosis not present

## 2015-02-14 DIAGNOSIS — D509 Iron deficiency anemia, unspecified: Secondary | ICD-10-CM | POA: Diagnosis not present

## 2015-02-14 DIAGNOSIS — E089 Diabetes mellitus due to underlying condition without complications: Secondary | ICD-10-CM | POA: Diagnosis not present

## 2015-02-14 DIAGNOSIS — N186 End stage renal disease: Secondary | ICD-10-CM | POA: Diagnosis not present

## 2015-02-14 DIAGNOSIS — D631 Anemia in chronic kidney disease: Secondary | ICD-10-CM | POA: Diagnosis not present

## 2015-02-14 DIAGNOSIS — N2581 Secondary hyperparathyroidism of renal origin: Secondary | ICD-10-CM | POA: Diagnosis not present

## 2015-02-16 DIAGNOSIS — N186 End stage renal disease: Secondary | ICD-10-CM | POA: Diagnosis not present

## 2015-02-16 DIAGNOSIS — D509 Iron deficiency anemia, unspecified: Secondary | ICD-10-CM | POA: Diagnosis not present

## 2015-02-16 DIAGNOSIS — D631 Anemia in chronic kidney disease: Secondary | ICD-10-CM | POA: Diagnosis not present

## 2015-02-16 DIAGNOSIS — E089 Diabetes mellitus due to underlying condition without complications: Secondary | ICD-10-CM | POA: Diagnosis not present

## 2015-02-16 DIAGNOSIS — N2581 Secondary hyperparathyroidism of renal origin: Secondary | ICD-10-CM | POA: Diagnosis not present

## 2015-02-17 ENCOUNTER — Telehealth: Payer: Self-pay | Admitting: *Deleted

## 2015-02-17 NOTE — Telephone Encounter (Signed)
Nicky from Dr. Kathee Delton office called regarding a antibiotic for his infection due to his kidney issues. I called Dr. Nyoka Cowden so that he could advise on this for the dentist office, he stated that clindamycin will be fine to use with this patient. I returned the call to [facility] at Dr. Kathee Delton office to inform her that Dr. Nyoka Cowden agreed with the clindamycin.

## 2015-02-18 DIAGNOSIS — E089 Diabetes mellitus due to underlying condition without complications: Secondary | ICD-10-CM | POA: Diagnosis not present

## 2015-02-18 DIAGNOSIS — N2581 Secondary hyperparathyroidism of renal origin: Secondary | ICD-10-CM | POA: Diagnosis not present

## 2015-02-18 DIAGNOSIS — D509 Iron deficiency anemia, unspecified: Secondary | ICD-10-CM | POA: Diagnosis not present

## 2015-02-18 DIAGNOSIS — N186 End stage renal disease: Secondary | ICD-10-CM | POA: Diagnosis not present

## 2015-02-18 DIAGNOSIS — D631 Anemia in chronic kidney disease: Secondary | ICD-10-CM | POA: Diagnosis not present

## 2015-02-21 ENCOUNTER — Telehealth: Payer: Self-pay | Admitting: Neurology

## 2015-02-21 DIAGNOSIS — E089 Diabetes mellitus due to underlying condition without complications: Secondary | ICD-10-CM | POA: Diagnosis not present

## 2015-02-21 DIAGNOSIS — N186 End stage renal disease: Secondary | ICD-10-CM | POA: Diagnosis not present

## 2015-02-21 DIAGNOSIS — D631 Anemia in chronic kidney disease: Secondary | ICD-10-CM | POA: Diagnosis not present

## 2015-02-21 DIAGNOSIS — N2581 Secondary hyperparathyroidism of renal origin: Secondary | ICD-10-CM | POA: Diagnosis not present

## 2015-02-21 DIAGNOSIS — D509 Iron deficiency anemia, unspecified: Secondary | ICD-10-CM | POA: Diagnosis not present

## 2015-02-21 NOTE — Telephone Encounter (Signed)
Pt's wife called said she has been trying to get in touch with [facility] but has not rec'd any response. She is asking if CPAP RX can be called [facility]. This operator c/a appt for 03/08/15 and advised her call back when he starts CPAP to schedule initial appt.

## 2015-02-21 NOTE — Telephone Encounter (Signed)
Received this email from [place]: "I called Kirk [name] as soon as we got off the phone, A woman answered and right after I stated I was Kirk [name] with [facility] we were disconnected. I tried to call back and went to Voicemail after 2 rings, I left a voicemail with my contact information and requesting Kirk [name] call me back in reference to his Cpap. I just wanted to follow up with you and I will try to contact him again tomorrow if I do not get a return call today."  I also called the pt. No answer, left a message asking him to call me back.

## 2015-02-21 NOTE — Telephone Encounter (Signed)
Spoke to [employer]. They have been trying to get in touch with the pt as well. Nira Conn was going to call pt right now and email me when she gets him scheduled.

## 2015-02-22 ENCOUNTER — Other Ambulatory Visit: Payer: Self-pay

## 2015-02-22 DIAGNOSIS — N186 End stage renal disease: Secondary | ICD-10-CM | POA: Diagnosis not present

## 2015-02-22 DIAGNOSIS — T8612 Kidney transplant failure: Secondary | ICD-10-CM | POA: Diagnosis not present

## 2015-02-22 DIAGNOSIS — Z992 Dependence on renal dialysis: Secondary | ICD-10-CM | POA: Diagnosis not present

## 2015-02-22 NOTE — Telephone Encounter (Signed)
Pt's wife called and was told that [facility] has been trying to get a hold of them. She says she has been trying to as well. Is there another phone number that was provided that the pt's wife can have?  She said she will try to call them again and if she does not have any luck she will call the office back.

## 2015-02-22 NOTE — Patient Outreach (Signed)
[facility]) Care Management  02/22/2015  Kirk [name] 1961/01/18 [id number]   Telephone Screen  Referral Date: 02/21/15 Referral Source: [employer] tier 4 list Referral Reason: 2-admissions, 1-ambulance encounter, 2-ER visits, COPD   Outreach attempt # 1 to patient.  No answer at present. RN CM left HIPAA compliant voicemail message for patient.   Plan: RN CM will make outreach attempt to patient.  Enzo Montgomery, RN,BSN,CCM [place] Management Telephonic Care Management Coordinator Direct Phone: [phone] Toll Free: [phone] Fax: [phone]

## 2015-02-22 NOTE — Telephone Encounter (Signed)
This is the second time I have returned this pt's wife's call with and left a message asking her to call me back.  [facility]'s number is [phone]. Also, [phone]. The first number reaches Sheldon, the representative from [facility] that has been trying to get in touch with them.  Please advise pt's wife of this information if she calls back.

## 2015-02-22 NOTE — Telephone Encounter (Signed)
[facility] will advise pt that he needs a follow up 30-60 days after starting cpap with Dr. Brett Fairy for insurance purposes.

## 2015-02-22 NOTE — Telephone Encounter (Signed)
Pt's wife called back states she reached [facility] and pt has appt Thursday to get cpap machine looked at. She apologizes for any confusion

## 2015-02-23 DIAGNOSIS — D509 Iron deficiency anemia, unspecified: Secondary | ICD-10-CM | POA: Diagnosis not present

## 2015-02-23 DIAGNOSIS — N186 End stage renal disease: Secondary | ICD-10-CM | POA: Diagnosis not present

## 2015-02-23 DIAGNOSIS — N2581 Secondary hyperparathyroidism of renal origin: Secondary | ICD-10-CM | POA: Diagnosis not present

## 2015-02-23 DIAGNOSIS — E089 Diabetes mellitus due to underlying condition without complications: Secondary | ICD-10-CM | POA: Diagnosis not present

## 2015-02-23 DIAGNOSIS — D631 Anemia in chronic kidney disease: Secondary | ICD-10-CM | POA: Diagnosis not present

## 2015-02-24 ENCOUNTER — Ambulatory Visit: Payer: Self-pay

## 2015-02-24 ENCOUNTER — Other Ambulatory Visit: Payer: Self-pay

## 2015-02-24 NOTE — Patient Outreach (Signed)
[facility]) Care Management  02/24/2015  PRANIT SOKOL 09-23-1960 [id number]   Telephone Screen  Referral Date: 02/21/15 Referral Source: [employer] tier 4 list Referral Reason: 2-admissions, 1-ambulance encounter, 2-ER visits, COPD   Outreach attempt # 2 to patient. No answer at present.   Plan: RN CM will make outreach attempt to patient within a week.  Enzo Montgomery, RN,BSN,CCM [place] Management Telephonic Care Management Coordinator Direct Phone: [phone] Toll Free: [phone] Fax: [phone]

## 2015-02-25 DIAGNOSIS — N186 End stage renal disease: Secondary | ICD-10-CM | POA: Diagnosis not present

## 2015-02-25 DIAGNOSIS — D631 Anemia in chronic kidney disease: Secondary | ICD-10-CM | POA: Diagnosis not present

## 2015-02-25 DIAGNOSIS — D509 Iron deficiency anemia, unspecified: Secondary | ICD-10-CM | POA: Diagnosis not present

## 2015-02-25 DIAGNOSIS — E089 Diabetes mellitus due to underlying condition without complications: Secondary | ICD-10-CM | POA: Diagnosis not present

## 2015-02-25 DIAGNOSIS — N2581 Secondary hyperparathyroidism of renal origin: Secondary | ICD-10-CM | POA: Diagnosis not present

## 2015-02-28 ENCOUNTER — Other Ambulatory Visit: Payer: Self-pay

## 2015-02-28 ENCOUNTER — Ambulatory Visit: Payer: Self-pay

## 2015-02-28 DIAGNOSIS — E089 Diabetes mellitus due to underlying condition without complications: Secondary | ICD-10-CM | POA: Diagnosis not present

## 2015-02-28 DIAGNOSIS — N186 End stage renal disease: Secondary | ICD-10-CM | POA: Diagnosis not present

## 2015-02-28 DIAGNOSIS — D509 Iron deficiency anemia, unspecified: Secondary | ICD-10-CM | POA: Diagnosis not present

## 2015-02-28 DIAGNOSIS — D631 Anemia in chronic kidney disease: Secondary | ICD-10-CM | POA: Diagnosis not present

## 2015-02-28 DIAGNOSIS — N2581 Secondary hyperparathyroidism of renal origin: Secondary | ICD-10-CM | POA: Diagnosis not present

## 2015-02-28 NOTE — Patient Outreach (Signed)
[facility]) Care Management  02/28/2015  Kirk [name] [birth year] [id number]     Telephone Screen  Referral Date: 02/21/15 Referral Source: [employer] tier 4 list Referral Reason: 2-admissions, 1-ambulance encounter, 2-ER visits, COPD   Outreach attempt #3 to patient. No answer. RN CM left HIPAA compliant voicemail message.   Plan: RN CM will send unsuccessful outreach letter to patient. RN CM will close case if no response from patient in 10 business days.  Enzo Montgomery, RN,BSN,CCM [place] Management Telephonic Care Management Coordinator Direct Phone: [phone] Toll Free: [phone] Fax: [phone]

## 2015-03-02 DIAGNOSIS — E089 Diabetes mellitus due to underlying condition without complications: Secondary | ICD-10-CM | POA: Diagnosis not present

## 2015-03-02 DIAGNOSIS — N2581 Secondary hyperparathyroidism of renal origin: Secondary | ICD-10-CM | POA: Diagnosis not present

## 2015-03-02 DIAGNOSIS — N186 End stage renal disease: Secondary | ICD-10-CM | POA: Diagnosis not present

## 2015-03-02 DIAGNOSIS — D509 Iron deficiency anemia, unspecified: Secondary | ICD-10-CM | POA: Diagnosis not present

## 2015-03-02 DIAGNOSIS — D631 Anemia in chronic kidney disease: Secondary | ICD-10-CM | POA: Diagnosis not present

## 2015-03-04 DIAGNOSIS — N2581 Secondary hyperparathyroidism of renal origin: Secondary | ICD-10-CM | POA: Diagnosis not present

## 2015-03-04 DIAGNOSIS — D631 Anemia in chronic kidney disease: Secondary | ICD-10-CM | POA: Diagnosis not present

## 2015-03-04 DIAGNOSIS — N186 End stage renal disease: Secondary | ICD-10-CM | POA: Diagnosis not present

## 2015-03-04 DIAGNOSIS — D509 Iron deficiency anemia, unspecified: Secondary | ICD-10-CM | POA: Diagnosis not present

## 2015-03-04 DIAGNOSIS — E089 Diabetes mellitus due to underlying condition without complications: Secondary | ICD-10-CM | POA: Diagnosis not present

## 2015-03-07 DIAGNOSIS — N186 End stage renal disease: Secondary | ICD-10-CM | POA: Diagnosis not present

## 2015-03-07 DIAGNOSIS — N2581 Secondary hyperparathyroidism of renal origin: Secondary | ICD-10-CM | POA: Diagnosis not present

## 2015-03-07 DIAGNOSIS — D631 Anemia in chronic kidney disease: Secondary | ICD-10-CM | POA: Diagnosis not present

## 2015-03-07 DIAGNOSIS — E089 Diabetes mellitus due to underlying condition without complications: Secondary | ICD-10-CM | POA: Diagnosis not present

## 2015-03-07 DIAGNOSIS — D509 Iron deficiency anemia, unspecified: Secondary | ICD-10-CM | POA: Diagnosis not present

## 2015-03-08 ENCOUNTER — Ambulatory Visit: Payer: Self-pay | Admitting: Neurology

## 2015-03-09 DIAGNOSIS — E089 Diabetes mellitus due to underlying condition without complications: Secondary | ICD-10-CM | POA: Diagnosis not present

## 2015-03-09 DIAGNOSIS — N186 End stage renal disease: Secondary | ICD-10-CM | POA: Diagnosis not present

## 2015-03-09 DIAGNOSIS — N2581 Secondary hyperparathyroidism of renal origin: Secondary | ICD-10-CM | POA: Diagnosis not present

## 2015-03-09 DIAGNOSIS — D631 Anemia in chronic kidney disease: Secondary | ICD-10-CM | POA: Diagnosis not present

## 2015-03-09 DIAGNOSIS — D509 Iron deficiency anemia, unspecified: Secondary | ICD-10-CM | POA: Diagnosis not present

## 2015-03-11 DIAGNOSIS — E089 Diabetes mellitus due to underlying condition without complications: Secondary | ICD-10-CM | POA: Diagnosis not present

## 2015-03-11 DIAGNOSIS — N186 End stage renal disease: Secondary | ICD-10-CM | POA: Diagnosis not present

## 2015-03-11 DIAGNOSIS — D509 Iron deficiency anemia, unspecified: Secondary | ICD-10-CM | POA: Diagnosis not present

## 2015-03-11 DIAGNOSIS — D631 Anemia in chronic kidney disease: Secondary | ICD-10-CM | POA: Diagnosis not present

## 2015-03-11 DIAGNOSIS — N2581 Secondary hyperparathyroidism of renal origin: Secondary | ICD-10-CM | POA: Diagnosis not present

## 2015-03-14 ENCOUNTER — Other Ambulatory Visit: Payer: Self-pay

## 2015-03-14 DIAGNOSIS — D631 Anemia in chronic kidney disease: Secondary | ICD-10-CM | POA: Diagnosis not present

## 2015-03-14 DIAGNOSIS — N186 End stage renal disease: Secondary | ICD-10-CM | POA: Diagnosis not present

## 2015-03-14 DIAGNOSIS — N2581 Secondary hyperparathyroidism of renal origin: Secondary | ICD-10-CM | POA: Diagnosis not present

## 2015-03-14 DIAGNOSIS — E089 Diabetes mellitus due to underlying condition without complications: Secondary | ICD-10-CM | POA: Diagnosis not present

## 2015-03-14 DIAGNOSIS — D509 Iron deficiency anemia, unspecified: Secondary | ICD-10-CM | POA: Diagnosis not present

## 2015-03-14 NOTE — Patient Outreach (Signed)
[facility]) Care Management  03/14/2015  Kirk [name] 07-15-1960 [id number]    Telephone Screen  Referral Date: 02/21/15 Referral Source: [employer] tier 4 list Referral Reason: 2-admissions, 1-ambulance encounter, 2-ER visits, COPD   Multiple attempts to reach patient with no success. No response from letter mailed to patient. Case is being closed at this time.   Plan: RN CM will notify [facility] adminisitrative assistant of case closure.   Enzo Montgomery, RN,BSN,CCM [place] Management Telephonic Care Management Coordinator Direct Phone: [phone] Toll Free: [phone] Fax: [phone]

## 2015-03-16 DIAGNOSIS — D631 Anemia in chronic kidney disease: Secondary | ICD-10-CM | POA: Diagnosis not present

## 2015-03-16 DIAGNOSIS — N186 End stage renal disease: Secondary | ICD-10-CM | POA: Diagnosis not present

## 2015-03-16 DIAGNOSIS — D509 Iron deficiency anemia, unspecified: Secondary | ICD-10-CM | POA: Diagnosis not present

## 2015-03-16 DIAGNOSIS — E089 Diabetes mellitus due to underlying condition without complications: Secondary | ICD-10-CM | POA: Diagnosis not present

## 2015-03-16 DIAGNOSIS — N2581 Secondary hyperparathyroidism of renal origin: Secondary | ICD-10-CM | POA: Diagnosis not present

## 2015-03-18 DIAGNOSIS — E089 Diabetes mellitus due to underlying condition without complications: Secondary | ICD-10-CM | POA: Diagnosis not present

## 2015-03-18 DIAGNOSIS — D631 Anemia in chronic kidney disease: Secondary | ICD-10-CM | POA: Diagnosis not present

## 2015-03-18 DIAGNOSIS — D509 Iron deficiency anemia, unspecified: Secondary | ICD-10-CM | POA: Diagnosis not present

## 2015-03-18 DIAGNOSIS — N186 End stage renal disease: Secondary | ICD-10-CM | POA: Diagnosis not present

## 2015-03-18 DIAGNOSIS — N2581 Secondary hyperparathyroidism of renal origin: Secondary | ICD-10-CM | POA: Diagnosis not present

## 2015-03-21 DIAGNOSIS — N2581 Secondary hyperparathyroidism of renal origin: Secondary | ICD-10-CM | POA: Diagnosis not present

## 2015-03-21 DIAGNOSIS — E089 Diabetes mellitus due to underlying condition without complications: Secondary | ICD-10-CM | POA: Diagnosis not present

## 2015-03-21 DIAGNOSIS — D631 Anemia in chronic kidney disease: Secondary | ICD-10-CM | POA: Diagnosis not present

## 2015-03-21 DIAGNOSIS — N186 End stage renal disease: Secondary | ICD-10-CM | POA: Diagnosis not present

## 2015-03-21 DIAGNOSIS — D509 Iron deficiency anemia, unspecified: Secondary | ICD-10-CM | POA: Diagnosis not present

## 2015-03-22 DIAGNOSIS — T8612 Kidney transplant failure: Secondary | ICD-10-CM | POA: Diagnosis not present

## 2015-03-22 DIAGNOSIS — Z992 Dependence on renal dialysis: Secondary | ICD-10-CM | POA: Diagnosis not present

## 2015-03-22 DIAGNOSIS — N186 End stage renal disease: Secondary | ICD-10-CM | POA: Diagnosis not present

## 2015-03-23 DIAGNOSIS — E089 Diabetes mellitus due to underlying condition without complications: Secondary | ICD-10-CM | POA: Diagnosis not present

## 2015-03-23 DIAGNOSIS — N2581 Secondary hyperparathyroidism of renal origin: Secondary | ICD-10-CM | POA: Diagnosis not present

## 2015-03-23 DIAGNOSIS — N186 End stage renal disease: Secondary | ICD-10-CM | POA: Diagnosis not present

## 2015-03-23 DIAGNOSIS — E8779 Other fluid overload: Secondary | ICD-10-CM | POA: Diagnosis not present

## 2015-03-23 DIAGNOSIS — D509 Iron deficiency anemia, unspecified: Secondary | ICD-10-CM | POA: Diagnosis not present

## 2015-03-23 DIAGNOSIS — D631 Anemia in chronic kidney disease: Secondary | ICD-10-CM | POA: Diagnosis not present

## 2015-03-25 DIAGNOSIS — E089 Diabetes mellitus due to underlying condition without complications: Secondary | ICD-10-CM | POA: Diagnosis not present

## 2015-03-25 DIAGNOSIS — D631 Anemia in chronic kidney disease: Secondary | ICD-10-CM | POA: Diagnosis not present

## 2015-03-25 DIAGNOSIS — N186 End stage renal disease: Secondary | ICD-10-CM | POA: Diagnosis not present

## 2015-03-25 DIAGNOSIS — N2581 Secondary hyperparathyroidism of renal origin: Secondary | ICD-10-CM | POA: Diagnosis not present

## 2015-03-25 DIAGNOSIS — D509 Iron deficiency anemia, unspecified: Secondary | ICD-10-CM | POA: Diagnosis not present

## 2015-03-28 DIAGNOSIS — D631 Anemia in chronic kidney disease: Secondary | ICD-10-CM | POA: Diagnosis not present

## 2015-03-28 DIAGNOSIS — D509 Iron deficiency anemia, unspecified: Secondary | ICD-10-CM | POA: Diagnosis not present

## 2015-03-28 DIAGNOSIS — E089 Diabetes mellitus due to underlying condition without complications: Secondary | ICD-10-CM | POA: Diagnosis not present

## 2015-03-28 DIAGNOSIS — N2581 Secondary hyperparathyroidism of renal origin: Secondary | ICD-10-CM | POA: Diagnosis not present

## 2015-03-28 DIAGNOSIS — N186 End stage renal disease: Secondary | ICD-10-CM | POA: Diagnosis not present

## 2015-03-30 DIAGNOSIS — N186 End stage renal disease: Secondary | ICD-10-CM | POA: Diagnosis not present

## 2015-03-30 DIAGNOSIS — D631 Anemia in chronic kidney disease: Secondary | ICD-10-CM | POA: Diagnosis not present

## 2015-03-30 DIAGNOSIS — N2581 Secondary hyperparathyroidism of renal origin: Secondary | ICD-10-CM | POA: Diagnosis not present

## 2015-03-30 DIAGNOSIS — E089 Diabetes mellitus due to underlying condition without complications: Secondary | ICD-10-CM | POA: Diagnosis not present

## 2015-03-30 DIAGNOSIS — D509 Iron deficiency anemia, unspecified: Secondary | ICD-10-CM | POA: Diagnosis not present

## 2015-04-01 DIAGNOSIS — E089 Diabetes mellitus due to underlying condition without complications: Secondary | ICD-10-CM | POA: Diagnosis not present

## 2015-04-01 DIAGNOSIS — D509 Iron deficiency anemia, unspecified: Secondary | ICD-10-CM | POA: Diagnosis not present

## 2015-04-01 DIAGNOSIS — D631 Anemia in chronic kidney disease: Secondary | ICD-10-CM | POA: Diagnosis not present

## 2015-04-01 DIAGNOSIS — N186 End stage renal disease: Secondary | ICD-10-CM | POA: Diagnosis not present

## 2015-04-01 DIAGNOSIS — N2581 Secondary hyperparathyroidism of renal origin: Secondary | ICD-10-CM | POA: Diagnosis not present

## 2015-04-04 DIAGNOSIS — D631 Anemia in chronic kidney disease: Secondary | ICD-10-CM | POA: Diagnosis not present

## 2015-04-04 DIAGNOSIS — N2581 Secondary hyperparathyroidism of renal origin: Secondary | ICD-10-CM | POA: Diagnosis not present

## 2015-04-04 DIAGNOSIS — D509 Iron deficiency anemia, unspecified: Secondary | ICD-10-CM | POA: Diagnosis not present

## 2015-04-04 DIAGNOSIS — N186 End stage renal disease: Secondary | ICD-10-CM | POA: Diagnosis not present

## 2015-04-04 DIAGNOSIS — E089 Diabetes mellitus due to underlying condition without complications: Secondary | ICD-10-CM | POA: Diagnosis not present

## 2015-04-05 DIAGNOSIS — D631 Anemia in chronic kidney disease: Secondary | ICD-10-CM | POA: Diagnosis not present

## 2015-04-05 DIAGNOSIS — E089 Diabetes mellitus due to underlying condition without complications: Secondary | ICD-10-CM | POA: Diagnosis not present

## 2015-04-05 DIAGNOSIS — D509 Iron deficiency anemia, unspecified: Secondary | ICD-10-CM | POA: Diagnosis not present

## 2015-04-05 DIAGNOSIS — N186 End stage renal disease: Secondary | ICD-10-CM | POA: Diagnosis not present

## 2015-04-05 DIAGNOSIS — N2581 Secondary hyperparathyroidism of renal origin: Secondary | ICD-10-CM | POA: Diagnosis not present

## 2015-04-06 DIAGNOSIS — D509 Iron deficiency anemia, unspecified: Secondary | ICD-10-CM | POA: Diagnosis not present

## 2015-04-06 DIAGNOSIS — N186 End stage renal disease: Secondary | ICD-10-CM | POA: Diagnosis not present

## 2015-04-06 DIAGNOSIS — N2581 Secondary hyperparathyroidism of renal origin: Secondary | ICD-10-CM | POA: Diagnosis not present

## 2015-04-06 DIAGNOSIS — E089 Diabetes mellitus due to underlying condition without complications: Secondary | ICD-10-CM | POA: Diagnosis not present

## 2015-04-06 DIAGNOSIS — D631 Anemia in chronic kidney disease: Secondary | ICD-10-CM | POA: Diagnosis not present

## 2015-04-08 DIAGNOSIS — N186 End stage renal disease: Secondary | ICD-10-CM | POA: Diagnosis not present

## 2015-04-08 DIAGNOSIS — D509 Iron deficiency anemia, unspecified: Secondary | ICD-10-CM | POA: Diagnosis not present

## 2015-04-08 DIAGNOSIS — E089 Diabetes mellitus due to underlying condition without complications: Secondary | ICD-10-CM | POA: Diagnosis not present

## 2015-04-08 DIAGNOSIS — N2581 Secondary hyperparathyroidism of renal origin: Secondary | ICD-10-CM | POA: Diagnosis not present

## 2015-04-08 DIAGNOSIS — D631 Anemia in chronic kidney disease: Secondary | ICD-10-CM | POA: Diagnosis not present

## 2015-04-11 DIAGNOSIS — E089 Diabetes mellitus due to underlying condition without complications: Secondary | ICD-10-CM | POA: Diagnosis not present

## 2015-04-11 DIAGNOSIS — N186 End stage renal disease: Secondary | ICD-10-CM | POA: Diagnosis not present

## 2015-04-11 DIAGNOSIS — D509 Iron deficiency anemia, unspecified: Secondary | ICD-10-CM | POA: Diagnosis not present

## 2015-04-11 DIAGNOSIS — N2581 Secondary hyperparathyroidism of renal origin: Secondary | ICD-10-CM | POA: Diagnosis not present

## 2015-04-11 DIAGNOSIS — D631 Anemia in chronic kidney disease: Secondary | ICD-10-CM | POA: Diagnosis not present

## 2015-04-13 DIAGNOSIS — D509 Iron deficiency anemia, unspecified: Secondary | ICD-10-CM | POA: Diagnosis not present

## 2015-04-13 DIAGNOSIS — N186 End stage renal disease: Secondary | ICD-10-CM | POA: Diagnosis not present

## 2015-04-13 DIAGNOSIS — N2581 Secondary hyperparathyroidism of renal origin: Secondary | ICD-10-CM | POA: Diagnosis not present

## 2015-04-13 DIAGNOSIS — E089 Diabetes mellitus due to underlying condition without complications: Secondary | ICD-10-CM | POA: Diagnosis not present

## 2015-04-13 DIAGNOSIS — D631 Anemia in chronic kidney disease: Secondary | ICD-10-CM | POA: Diagnosis not present

## 2015-04-16 DIAGNOSIS — N2581 Secondary hyperparathyroidism of renal origin: Secondary | ICD-10-CM | POA: Diagnosis not present

## 2015-04-16 DIAGNOSIS — N186 End stage renal disease: Secondary | ICD-10-CM | POA: Diagnosis not present

## 2015-04-16 DIAGNOSIS — E089 Diabetes mellitus due to underlying condition without complications: Secondary | ICD-10-CM | POA: Diagnosis not present

## 2015-04-16 DIAGNOSIS — M6281 Muscle weakness (generalized): Secondary | ICD-10-CM | POA: Diagnosis not present

## 2015-04-16 DIAGNOSIS — D509 Iron deficiency anemia, unspecified: Secondary | ICD-10-CM | POA: Diagnosis not present

## 2015-04-16 DIAGNOSIS — D631 Anemia in chronic kidney disease: Secondary | ICD-10-CM | POA: Diagnosis not present

## 2015-04-18 DIAGNOSIS — N186 End stage renal disease: Secondary | ICD-10-CM | POA: Diagnosis not present

## 2015-04-18 DIAGNOSIS — N2581 Secondary hyperparathyroidism of renal origin: Secondary | ICD-10-CM | POA: Diagnosis not present

## 2015-04-18 DIAGNOSIS — D509 Iron deficiency anemia, unspecified: Secondary | ICD-10-CM | POA: Diagnosis not present

## 2015-04-18 DIAGNOSIS — D631 Anemia in chronic kidney disease: Secondary | ICD-10-CM | POA: Diagnosis not present

## 2015-04-18 DIAGNOSIS — E089 Diabetes mellitus due to underlying condition without complications: Secondary | ICD-10-CM | POA: Diagnosis not present

## 2015-04-20 DIAGNOSIS — D631 Anemia in chronic kidney disease: Secondary | ICD-10-CM | POA: Diagnosis not present

## 2015-04-20 DIAGNOSIS — D509 Iron deficiency anemia, unspecified: Secondary | ICD-10-CM | POA: Diagnosis not present

## 2015-04-20 DIAGNOSIS — N186 End stage renal disease: Secondary | ICD-10-CM | POA: Diagnosis not present

## 2015-04-20 DIAGNOSIS — E089 Diabetes mellitus due to underlying condition without complications: Secondary | ICD-10-CM | POA: Diagnosis not present

## 2015-04-20 DIAGNOSIS — N2581 Secondary hyperparathyroidism of renal origin: Secondary | ICD-10-CM | POA: Diagnosis not present

## 2015-04-22 DIAGNOSIS — N186 End stage renal disease: Secondary | ICD-10-CM | POA: Diagnosis not present

## 2015-04-22 DIAGNOSIS — E089 Diabetes mellitus due to underlying condition without complications: Secondary | ICD-10-CM | POA: Diagnosis not present

## 2015-04-22 DIAGNOSIS — T8612 Kidney transplant failure: Secondary | ICD-10-CM | POA: Diagnosis not present

## 2015-04-22 DIAGNOSIS — Z992 Dependence on renal dialysis: Secondary | ICD-10-CM | POA: Diagnosis not present

## 2015-04-22 DIAGNOSIS — N2581 Secondary hyperparathyroidism of renal origin: Secondary | ICD-10-CM | POA: Diagnosis not present

## 2015-04-22 DIAGNOSIS — D509 Iron deficiency anemia, unspecified: Secondary | ICD-10-CM | POA: Diagnosis not present

## 2015-04-22 DIAGNOSIS — D631 Anemia in chronic kidney disease: Secondary | ICD-10-CM | POA: Diagnosis not present

## 2015-04-25 DIAGNOSIS — D509 Iron deficiency anemia, unspecified: Secondary | ICD-10-CM | POA: Diagnosis not present

## 2015-04-25 DIAGNOSIS — E089 Diabetes mellitus due to underlying condition without complications: Secondary | ICD-10-CM | POA: Diagnosis not present

## 2015-04-25 DIAGNOSIS — E8779 Other fluid overload: Secondary | ICD-10-CM | POA: Diagnosis not present

## 2015-04-25 DIAGNOSIS — N186 End stage renal disease: Secondary | ICD-10-CM | POA: Diagnosis not present

## 2015-04-25 DIAGNOSIS — N2581 Secondary hyperparathyroidism of renal origin: Secondary | ICD-10-CM | POA: Diagnosis not present

## 2015-04-27 DIAGNOSIS — N2581 Secondary hyperparathyroidism of renal origin: Secondary | ICD-10-CM | POA: Diagnosis not present

## 2015-04-27 DIAGNOSIS — E8779 Other fluid overload: Secondary | ICD-10-CM | POA: Diagnosis not present

## 2015-04-27 DIAGNOSIS — D509 Iron deficiency anemia, unspecified: Secondary | ICD-10-CM | POA: Diagnosis not present

## 2015-04-27 DIAGNOSIS — E089 Diabetes mellitus due to underlying condition without complications: Secondary | ICD-10-CM | POA: Diagnosis not present

## 2015-04-27 DIAGNOSIS — N186 End stage renal disease: Secondary | ICD-10-CM | POA: Diagnosis not present

## 2015-04-30 DIAGNOSIS — N2581 Secondary hyperparathyroidism of renal origin: Secondary | ICD-10-CM | POA: Diagnosis not present

## 2015-04-30 DIAGNOSIS — E8779 Other fluid overload: Secondary | ICD-10-CM | POA: Diagnosis not present

## 2015-04-30 DIAGNOSIS — E089 Diabetes mellitus due to underlying condition without complications: Secondary | ICD-10-CM | POA: Diagnosis not present

## 2015-04-30 DIAGNOSIS — N186 End stage renal disease: Secondary | ICD-10-CM | POA: Diagnosis not present

## 2015-04-30 DIAGNOSIS — D509 Iron deficiency anemia, unspecified: Secondary | ICD-10-CM | POA: Diagnosis not present

## 2015-05-02 DIAGNOSIS — E8779 Other fluid overload: Secondary | ICD-10-CM | POA: Diagnosis not present

## 2015-05-02 DIAGNOSIS — N186 End stage renal disease: Secondary | ICD-10-CM | POA: Diagnosis not present

## 2015-05-02 DIAGNOSIS — N2581 Secondary hyperparathyroidism of renal origin: Secondary | ICD-10-CM | POA: Diagnosis not present

## 2015-05-02 DIAGNOSIS — E089 Diabetes mellitus due to underlying condition without complications: Secondary | ICD-10-CM | POA: Diagnosis not present

## 2015-05-02 DIAGNOSIS — D509 Iron deficiency anemia, unspecified: Secondary | ICD-10-CM | POA: Diagnosis not present

## 2015-05-03 DIAGNOSIS — Z992 Dependence on renal dialysis: Secondary | ICD-10-CM | POA: Diagnosis not present

## 2015-05-03 DIAGNOSIS — T82858A Stenosis of vascular prosthetic devices, implants and grafts, initial encounter: Secondary | ICD-10-CM | POA: Diagnosis not present

## 2015-05-03 DIAGNOSIS — N186 End stage renal disease: Secondary | ICD-10-CM | POA: Diagnosis not present

## 2015-05-03 DIAGNOSIS — I871 Compression of vein: Secondary | ICD-10-CM | POA: Diagnosis not present

## 2015-05-04 DIAGNOSIS — D509 Iron deficiency anemia, unspecified: Secondary | ICD-10-CM | POA: Diagnosis not present

## 2015-05-04 DIAGNOSIS — E8779 Other fluid overload: Secondary | ICD-10-CM | POA: Diagnosis not present

## 2015-05-04 DIAGNOSIS — E089 Diabetes mellitus due to underlying condition without complications: Secondary | ICD-10-CM | POA: Diagnosis not present

## 2015-05-04 DIAGNOSIS — N2581 Secondary hyperparathyroidism of renal origin: Secondary | ICD-10-CM | POA: Diagnosis not present

## 2015-05-04 DIAGNOSIS — N186 End stage renal disease: Secondary | ICD-10-CM | POA: Diagnosis not present

## 2015-05-06 DIAGNOSIS — E8779 Other fluid overload: Secondary | ICD-10-CM | POA: Diagnosis not present

## 2015-05-06 DIAGNOSIS — N186 End stage renal disease: Secondary | ICD-10-CM | POA: Diagnosis not present

## 2015-05-06 DIAGNOSIS — E089 Diabetes mellitus due to underlying condition without complications: Secondary | ICD-10-CM | POA: Diagnosis not present

## 2015-05-06 DIAGNOSIS — N2581 Secondary hyperparathyroidism of renal origin: Secondary | ICD-10-CM | POA: Diagnosis not present

## 2015-05-06 DIAGNOSIS — D509 Iron deficiency anemia, unspecified: Secondary | ICD-10-CM | POA: Diagnosis not present

## 2015-05-09 DIAGNOSIS — N186 End stage renal disease: Secondary | ICD-10-CM | POA: Diagnosis not present

## 2015-05-09 DIAGNOSIS — D509 Iron deficiency anemia, unspecified: Secondary | ICD-10-CM | POA: Diagnosis not present

## 2015-05-09 DIAGNOSIS — N2581 Secondary hyperparathyroidism of renal origin: Secondary | ICD-10-CM | POA: Diagnosis not present

## 2015-05-09 DIAGNOSIS — E8779 Other fluid overload: Secondary | ICD-10-CM | POA: Diagnosis not present

## 2015-05-09 DIAGNOSIS — E089 Diabetes mellitus due to underlying condition without complications: Secondary | ICD-10-CM | POA: Diagnosis not present

## 2015-05-10 ENCOUNTER — Ambulatory Visit (INDEPENDENT_AMBULATORY_CARE_PROVIDER_SITE_OTHER): Payer: Medicare Other | Admitting: Internal Medicine

## 2015-05-10 ENCOUNTER — Encounter: Payer: Self-pay | Admitting: Internal Medicine

## 2015-05-10 VITALS — BP 140/84 | HR 79 | Temp 98.2°F | Ht 68.0 in | Wt 233.0 lb

## 2015-05-10 DIAGNOSIS — N186 End stage renal disease: Secondary | ICD-10-CM

## 2015-05-10 DIAGNOSIS — R002 Palpitations: Secondary | ICD-10-CM

## 2015-05-10 DIAGNOSIS — R609 Edema, unspecified: Secondary | ICD-10-CM

## 2015-05-10 DIAGNOSIS — E1121 Type 2 diabetes mellitus with diabetic nephropathy: Secondary | ICD-10-CM | POA: Diagnosis not present

## 2015-05-10 DIAGNOSIS — E785 Hyperlipidemia, unspecified: Secondary | ICD-10-CM | POA: Diagnosis not present

## 2015-05-10 DIAGNOSIS — G44209 Tension-type headache, unspecified, not intractable: Secondary | ICD-10-CM | POA: Diagnosis not present

## 2015-05-10 DIAGNOSIS — I10 Essential (primary) hypertension: Secondary | ICD-10-CM | POA: Diagnosis not present

## 2015-05-10 DIAGNOSIS — D631 Anemia in chronic kidney disease: Secondary | ICD-10-CM | POA: Diagnosis not present

## 2015-05-10 DIAGNOSIS — Z992 Dependence on renal dialysis: Secondary | ICD-10-CM | POA: Diagnosis not present

## 2015-05-10 DIAGNOSIS — N189 Chronic kidney disease, unspecified: Secondary | ICD-10-CM | POA: Diagnosis not present

## 2015-05-10 MED ORDER — TRAMADOL HCL 50 MG PO TABS: ORAL_TABLET | ORAL | Status: DC

## 2015-05-10 NOTE — Progress Notes (Signed)
Patient ID: Kirk [name], male   DOB: [birth year], 55 y.o.   MRN: [id number]    Facility  [facility]    Place of Service:   OFFICE    Allergies  Allergen Reactions  . Codeine Nausea And Vomiting and Nausea Only    Chief Complaint  Patient presents with  . Medical Management of Chronic Issues    3 month ov, blood pressure, blood sugar, cholesterol, anemia. Here with wife    HPI:  Type 2 diabetes mellitus with diabetic nephropathy, without long-term current use of insulin ([place]) - needs updated lab. He says it is being done at dialysis, but copies sre not being sent here.  Anemia in chronic renal disease - needs updatred lab  Essential hypertension - controlled  HLD (hyperlipidemia) - needs updated lab  ESRD on dialysis [facility]) - continues 3 days peer week  Edema, unspecified type - about 1-2+ bipedal  Tension headache - requesting refill of traMADol (ULTRAM) 50 MG tablet    Medications: Patient's Medications  New Prescriptions   No medications on file  Previous Medications   ACETAMINOPHEN (TYLENOL) 500 MG TABLET    Take 1 tablet (500 mg total) by mouth every 6 (six) hours as needed for moderate pain.   AMLODIPINE (NORVASC) 10 MG TABLET    Take 1 tablet (10 mg total) by mouth daily.   ASPIRIN EC 81 MG TABLET    Take 81 mg by mouth daily.   B COMPLEX-VITAMIN C-FOLIC ACID (NEPHRO-VITE) 0.8 MG TABS TABLET    Take 1 tablet by mouth daily.   BUDESONIDE-FORMOTEROL (SYMBICORT) 80-4.5 MCG/ACT INHALER    Inhale 2 puffs into the lungs 2 (two) times daily.   CALCITRIOL (ROCALTROL) 0.25 MCG CAPSULE    Take 1 capsule (0.25 mcg total) by mouth every Monday, Wednesday, and Friday with hemodialysis.   CALCIUM ACETATE, PHOS BINDER, (PHOSLYRA) 667 MG/5ML SOLN    Take 2,001 mg by mouth 3 (three) times daily with meals. And 667 mg with snack   CARVEDILOL (COREG) 25 MG TABLET    Take 1 tablet (25 mg total) by mouth 2 (two) times daily. Take one tablet in the morning and one tablet in the evening   CHOLECALCIFEROL (VITAMIN D3) 5000 UNITS TABS    Take 1 tablet by mouth daily.   DIPHENHYDRAMINE (BENADRYL) 25 MG TABLET    Take 50 mg by mouth 2 (two) times daily.    DOXAZOSIN (CARDURA) 4 MG TABLET    Take 0.5 tablet  by mouth in the am and 0.5 tablet by mouth in the pm to regulate blood pressure.   GUAIFENESIN-DEXTROMETHORPHAN (ROBITUSSIN DM) 100-10 MG/5ML SYRUP    Take 10 mLs by mouth every 4 (four) hours as needed for cough.   OXYCODONE-ACETAMINOPHEN (PERCOCET/ROXICET) 5-325 MG TABLET    Take 1-2 tablets by mouth every 8 (eight) hours as needed for severe pain.   PRAVASTATIN (PRAVACHOL) 40 MG TABLET    Take 1 tablet (40 mg total) by mouth every evening.   PROMETHAZINE (PHENERGAN) 25 MG TABLET    Take 1 tablet (25 mg total) by mouth every 8 (eight) hours as needed for nausea or vomiting.   TRAMADOL (ULTRAM) 50 MG TABLET    One up to 4 times daly if needed for pain  Modified Medications   No medications on file  Discontinued Medications   B COMPLEX-VITAMIN C-FOLIC ACID (NEPHRO-VITE) 0.8 MG TABS TABLET    Take 1 tablet by mouth daily.    Review of Systems  Constitutional: Negative for fever.       Chronically fatigued.  HENT: Positive for ear discharge and hearing loss. Negative for ear pain and tinnitus.   Eyes: Negative.   Respiratory: Negative for shortness of breath.        Dyspnea on exertion has improved. Pneumonia. Hospitalized 04/06/14 and 06/23/2014. Rehospitalized 09/18/2014 for increasing dyspnea felt secondary to acute exacerbation of his COPD plus fluid overload.  Cardiovascular: Negative for chest pain, palpitations and leg swelling.  Gastrointestinal: Negative for abdominal pain and abdominal distention.       Cholecystectomy 10/21/2014.  Endocrine:       Diabetes is under better control.  Genitourinary: Negative.        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  Musculoskeletal:       Neck discomfort. Chronic right  knee discomfort. Chronic back pains. Unsteady gait.  Skin:       Tissue fibrosis at the laparoscopic cholecystectomy incisions in the right abdomen and maple.  Neurological: Positive for dizziness.       Memory impairment is stable. Feet feel numb. Recurrent headaches  Hematological:       Anemia of CKD.  Psychiatric/Behavioral: Positive for confusion and decreased concentration.       Insomnia. Falls asleep in the day.    Filed Vitals:   05/10/15 1639  BP: 140/84  Pulse: 79  Temp: 98.2 F (36.8 C)  TempSrc: Oral  Height: '5\' 8"'$  (1.727 m)  Weight: 233 lb (105.688 kg)  SpO2: 96%   Body mass index is 35.44 kg/(m^2). Filed Weights   05/10/15 1639  Weight: 233 lb (105.688 kg)     Physical Exam  Constitutional: He is oriented to person, place, and time.  Obese.  HENT:  Loss of hearing.  Eyes:  Corrective lenses  Neck: No JVD present. No tracheal deviation present. No thyromegaly present.  Cardiovascular: Exam reveals no gallop.   No murmur heard. AF. 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  Abdominal: Soft. Bowel sounds are normal. He exhibits no distension and no mass. There is no tenderness.  Tender in the RUQ. Frequent nausea.  Musculoskeletal: Normal range of motion. He exhibits tenderness. He exhibits no edema.  Scar left knee. Tender right knee  Lymphadenopathy:    He has no cervical adenopathy.  Neurological: He is alert and oriented to person, place, and time. No cranial nerve deficit. Coordination normal.  Positive Tinel sign bilaterally  Skin: No rash noted. No erythema. No pallor.  Tissue fibrosis at cholecystectomy incisions in the right abdomen and umbilicus. Seborrheic nonhealing lesion of the left forearm. 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  Psychiatric:  Drowsy during the exam.    Labs reviewed: Lab Summary Latest Ref Rng 10/21/2014 10/19/2014 09/21/2014 09/20/2014  Hemoglobin 13.0 - 17.0 g/dL 8.5(L) 8.4(L) (None) (None)  Hematocrit 39.0 - 52.0 % 25.0(L)  24.9(L) (None) (None)  White count 4.0 - 10.5 K/uL (None) 3.3(L) (None) (None)  Platelet count 150 - 400 K/uL (None) 116(L) (None) (None)  Sodium 135 - 145 mmol/L 140 137 137 133(L)  Potassium 3.5 - 5.1 mmol/L 3.7 3.9 4.2 5.4(H)  Calcium 8.9 - 10.3 mg/dL (None) 8.0(L) 8.5(L) 8.2(L)  Phosphorus - (None) (None) (None) (None)  Creatinine 0.61 - 1.24 mg/dL (None) 12.04(H) 10.50(H) 14.08(H)  AST 15 - 41 U/L (None) 17 (None) 24  Alk Phos 38 - 126 U/L (None) 55 (None) 54  Bilirubin 0.3 - 1.2 mg/dL (None) 0.4 (None) 0.7  Glucose 65 - 99 mg/dL 127(H) 95 239(H) 240(H)  Cholesterol - (None) (None) (None) (None)  HDL cholesterol - (None) (None) (None) (None)  Triglycerides - (None) (None) (None) (None)  LDL Direct - (None) (None) (None) (None)  LDL Calc - (None) (None) (None) (None)  Total protein 6.5 - 8.1 g/dL (None) 6.6 (None) 6.2(L)  Albumin 3.5 - 5.0 g/dL (None) 3.3(L) (None) 3.3(L)   Lab Results  Component Value Date   TSH 2.466 *Test methodology is 3rd generation TSH* 12/17/2006   TSH 2.771 *Test methodology is 3rd generation TSH* 12/13/2006   Lab Results  Component Value Date   BUN 44* 10/19/2014   BUN 71* 09/21/2014   BUN 103* 09/20/2014   Lab Results  Component Value Date   HGBA1C 6.7* 09/14/2014   HGBA1C 6.0* 04/08/2014   HGBA1C 5.7* 11/26/2013    Assessment/Plan 1. Type 2 diabetes mellitus with diabetic nephropathy, without long-term current use of insulin (HCC) - Hemoglobin A1c; Future - Microalbumin, urine; Future  2. Anemia in chronic renal disease -CBC,, future  3. Essential hypertension controlled  4. HLD (hyperlipidemia) - Lipid panel; Future  5. ESRD on dialysis (HCC) - Hep C Antibody  6. Edema, unspecified type unchanged  7. Tension headache - traMADol (ULTRAM) 50 MG tablet; One up to 4 times daly if needed for pain  Dispense: 100 tablet; Refill: 3

## 2015-05-11 DIAGNOSIS — E089 Diabetes mellitus due to underlying condition without complications: Secondary | ICD-10-CM | POA: Diagnosis not present

## 2015-05-11 DIAGNOSIS — D509 Iron deficiency anemia, unspecified: Secondary | ICD-10-CM | POA: Diagnosis not present

## 2015-05-11 DIAGNOSIS — N2581 Secondary hyperparathyroidism of renal origin: Secondary | ICD-10-CM | POA: Diagnosis not present

## 2015-05-11 DIAGNOSIS — N186 End stage renal disease: Secondary | ICD-10-CM | POA: Diagnosis not present

## 2015-05-11 DIAGNOSIS — E8779 Other fluid overload: Secondary | ICD-10-CM | POA: Diagnosis not present

## 2015-05-11 LAB — FERRITIN

## 2015-05-11 LAB — HEPATIC FUNCTION PANEL: Alkaline Phosphatase: 65 U/L (ref 25–125)

## 2015-05-11 LAB — PTH, INTACT: PTH INTERP: 429

## 2015-05-13 DIAGNOSIS — E089 Diabetes mellitus due to underlying condition without complications: Secondary | ICD-10-CM | POA: Diagnosis not present

## 2015-05-13 DIAGNOSIS — E8779 Other fluid overload: Secondary | ICD-10-CM | POA: Diagnosis not present

## 2015-05-13 DIAGNOSIS — D509 Iron deficiency anemia, unspecified: Secondary | ICD-10-CM | POA: Diagnosis not present

## 2015-05-13 DIAGNOSIS — N2581 Secondary hyperparathyroidism of renal origin: Secondary | ICD-10-CM | POA: Diagnosis not present

## 2015-05-13 DIAGNOSIS — N186 End stage renal disease: Secondary | ICD-10-CM | POA: Diagnosis not present

## 2015-05-16 DIAGNOSIS — E089 Diabetes mellitus due to underlying condition without complications: Secondary | ICD-10-CM | POA: Diagnosis not present

## 2015-05-16 DIAGNOSIS — D509 Iron deficiency anemia, unspecified: Secondary | ICD-10-CM | POA: Diagnosis not present

## 2015-05-16 DIAGNOSIS — E8779 Other fluid overload: Secondary | ICD-10-CM | POA: Diagnosis not present

## 2015-05-16 DIAGNOSIS — N2581 Secondary hyperparathyroidism of renal origin: Secondary | ICD-10-CM | POA: Diagnosis not present

## 2015-05-16 DIAGNOSIS — N186 End stage renal disease: Secondary | ICD-10-CM | POA: Diagnosis not present

## 2015-05-17 ENCOUNTER — Inpatient Hospital Stay (HOSPITAL_COMMUNITY)
Admission: EM | Admit: 2015-05-17 | Discharge: 2015-05-19 | DRG: 202 | Disposition: A | Discharge status: Home / Self Care | Payer: Medicare Other | Attending: Internal Medicine | Admitting: Internal Medicine

## 2015-05-17 ENCOUNTER — Emergency Department (HOSPITAL_COMMUNITY): Payer: Medicare Other

## 2015-05-17 ENCOUNTER — Encounter (HOSPITAL_COMMUNITY): Payer: Self-pay | Admitting: *Deleted

## 2015-05-17 DIAGNOSIS — J129 Viral pneumonia, unspecified: Secondary | ICD-10-CM | POA: Diagnosis not present

## 2015-05-17 DIAGNOSIS — R05 Cough: Secondary | ICD-10-CM | POA: Diagnosis not present

## 2015-05-17 DIAGNOSIS — R0602 Shortness of breath: Secondary | ICD-10-CM | POA: Diagnosis not present

## 2015-05-17 DIAGNOSIS — N186 End stage renal disease: Secondary | ICD-10-CM | POA: Diagnosis not present

## 2015-05-17 DIAGNOSIS — J209 Acute bronchitis, unspecified: Principal | ICD-10-CM | POA: Diagnosis present

## 2015-05-17 DIAGNOSIS — Z8701 Personal history of pneumonia (recurrent): Secondary | ICD-10-CM

## 2015-05-17 DIAGNOSIS — I120 Hypertensive chronic kidney disease with stage 5 chronic kidney disease or end stage renal disease: Secondary | ICD-10-CM | POA: Diagnosis not present

## 2015-05-17 DIAGNOSIS — Z87891 Personal history of nicotine dependence: Secondary | ICD-10-CM

## 2015-05-17 DIAGNOSIS — Z992 Dependence on renal dialysis: Secondary | ICD-10-CM

## 2015-05-17 DIAGNOSIS — Z6836 Body mass index (BMI) 36.0-36.9, adult: Secondary | ICD-10-CM

## 2015-05-17 DIAGNOSIS — R0902 Hypoxemia: Secondary | ICD-10-CM | POA: Diagnosis present

## 2015-05-17 DIAGNOSIS — J9601 Acute respiratory failure with hypoxia: Secondary | ICD-10-CM | POA: Diagnosis present

## 2015-05-17 DIAGNOSIS — R059 Cough, unspecified: Secondary | ICD-10-CM | POA: Diagnosis present

## 2015-05-17 DIAGNOSIS — R509 Fever, unspecified: Secondary | ICD-10-CM | POA: Diagnosis present

## 2015-05-17 DIAGNOSIS — H9190 Unspecified hearing loss, unspecified ear: Secondary | ICD-10-CM

## 2015-05-17 DIAGNOSIS — Z7982 Long term (current) use of aspirin: Secondary | ICD-10-CM

## 2015-05-17 DIAGNOSIS — Z905 Acquired absence of kidney: Secondary | ICD-10-CM

## 2015-05-17 DIAGNOSIS — M791 Myalgia, unspecified site: Secondary | ICD-10-CM | POA: Diagnosis present

## 2015-05-17 DIAGNOSIS — E089 Diabetes mellitus due to underlying condition without complications: Secondary | ICD-10-CM | POA: Diagnosis not present

## 2015-05-17 DIAGNOSIS — Z940 Kidney transplant status: Secondary | ICD-10-CM | POA: Diagnosis not present

## 2015-05-17 DIAGNOSIS — E8779 Other fluid overload: Secondary | ICD-10-CM | POA: Diagnosis not present

## 2015-05-17 DIAGNOSIS — N2581 Secondary hyperparathyroidism of renal origin: Secondary | ICD-10-CM | POA: Diagnosis not present

## 2015-05-17 DIAGNOSIS — I10 Essential (primary) hypertension: Secondary | ICD-10-CM | POA: Diagnosis present

## 2015-05-17 DIAGNOSIS — Z7951 Long term (current) use of inhaled steroids: Secondary | ICD-10-CM

## 2015-05-17 DIAGNOSIS — Z79899 Other long term (current) drug therapy: Secondary | ICD-10-CM

## 2015-05-17 DIAGNOSIS — R0600 Dyspnea, unspecified: Secondary | ICD-10-CM

## 2015-05-17 DIAGNOSIS — E662 Morbid (severe) obesity with alveolar hypoventilation: Secondary | ICD-10-CM | POA: Diagnosis not present

## 2015-05-17 DIAGNOSIS — D509 Iron deficiency anemia, unspecified: Secondary | ICD-10-CM | POA: Diagnosis not present

## 2015-05-17 DIAGNOSIS — D631 Anemia in chronic kidney disease: Secondary | ICD-10-CM | POA: Diagnosis present

## 2015-05-17 DIAGNOSIS — E1122 Type 2 diabetes mellitus with diabetic chronic kidney disease: Secondary | ICD-10-CM | POA: Diagnosis present

## 2015-05-17 LAB — BASIC METABOLIC PANEL
Anion gap: 13 (ref 5–15)
BUN: 23 mg/dL — AB (ref 6–20)
CALCIUM: 9.5 mg/dL (ref 8.9–10.3)
CHLORIDE: 95 mmol/L — AB (ref 101–111)
CO2: 31 mmol/L (ref 22–32)
CREATININE: 7.71 mg/dL — AB (ref 0.61–1.24)
GFR calc non Af Amer: 7 mL/min — ABNORMAL LOW (ref >60)
GFR, EST AFRICAN AMERICAN: 8 mL/min — AB (ref >60)
Glucose, Bld: 132 mg/dL — ABNORMAL HIGH (ref 65–99)
Potassium: 4.1 mmol/L (ref 3.5–5.1)
Sodium: 139 mmol/L (ref 135–145)

## 2015-05-17 LAB — CBC
HCT: 35.6 % — ABNORMAL LOW (ref 39.0–52.0)
Hemoglobin: 12 g/dL — ABNORMAL LOW (ref 13.0–17.0)
MCH: 34.2 pg — AB (ref 26.0–34.0)
MCHC: 33.7 g/dL (ref 30.0–36.0)
MCV: 101.4 fL — AB (ref 78.0–100.0)
PLATELETS: 99 10*3/uL — AB (ref 150–400)
RBC: 3.51 MIL/uL — AB (ref 4.22–5.81)
RDW: 13 % (ref 11.5–15.5)
WBC: 5.4 10*3/uL (ref 4.0–10.5)

## 2015-05-17 LAB — TROPONIN I

## 2015-05-17 LAB — I-STAT CG4 LACTIC ACID, ED: Lactic Acid, Venous: 1.78 mmol/L (ref 0.5–2.0)

## 2015-05-17 LAB — INFLUENZA PANEL BY PCR (TYPE A & B)
H1N1 flu by pcr: NOT DETECTED
INFLAPCR: NEGATIVE
Influenza B By PCR: NEGATIVE

## 2015-05-17 LAB — BRAIN NATRIURETIC PEPTIDE: B Natriuretic Peptide: 143 pg/mL — ABNORMAL HIGH (ref 0.0–100.0)

## 2015-05-17 MED ORDER — PANTOPRAZOLE SODIUM 40 MG PO TBEC
40.0000 mg | DELAYED_RELEASE_TABLET | Freq: Every day | ORAL | Status: DC
  Administered 2015-05-18 – 2015-05-19 (×2): 40 mg via ORAL
  Filled 2015-05-17 (×2): qty 1

## 2015-05-17 MED ORDER — PIPERACILLIN-TAZOBACTAM 3.375 G IVPB 30 MIN
3.3750 g | Freq: Once | INTRAVENOUS | Status: AC
  Administered 2015-05-17: 3.375 g via INTRAVENOUS
  Filled 2015-05-17: qty 50

## 2015-05-17 MED ORDER — ONDANSETRON HCL 4 MG PO TABS: 4.0000 mg | ORAL_TABLET | Freq: Four times a day (QID) | ORAL | Status: DC | PRN

## 2015-05-17 MED ORDER — ACETAMINOPHEN 650 MG RE SUPP: 650.0000 mg | Freq: Four times a day (QID) | RECTAL | Status: DC | PRN

## 2015-05-17 MED ORDER — ACETAMINOPHEN 325 MG PO TABS
650.0000 mg | ORAL_TABLET | Freq: Four times a day (QID) | ORAL | Status: DC | PRN
  Administered 2015-05-18: 650 mg via ORAL
  Filled 2015-05-17: qty 2

## 2015-05-17 MED ORDER — GUAIFENESIN-DM 100-10 MG/5ML PO SYRP: 10.0000 mL | ORAL_SOLUTION | ORAL | Status: DC | PRN

## 2015-05-17 MED ORDER — VITAMIN D3 25 MCG (1000 UNIT) PO TABS
5000.0000 [IU] | ORAL_TABLET | Freq: Every day | ORAL | Status: DC
  Administered 2015-05-18 – 2015-05-19 (×2): 5000 [IU] via ORAL
  Filled 2015-05-17 (×5): qty 5

## 2015-05-17 MED ORDER — ONDANSETRON HCL 4 MG/2ML IJ SOLN
4.0000 mg | Freq: Four times a day (QID) | INTRAMUSCULAR | Status: DC | PRN
  Administered 2015-05-18: 4 mg via INTRAVENOUS
  Filled 2015-05-17: qty 2

## 2015-05-17 MED ORDER — ALBUTEROL SULFATE (2.5 MG/3ML) 0.083% IN NEBU: 2.5000 mg | INHALATION_SOLUTION | RESPIRATORY_TRACT | Status: DC | PRN

## 2015-05-17 MED ORDER — TRAMADOL HCL 50 MG PO TABS: 50.0000 mg | ORAL_TABLET | Freq: Four times a day (QID) | ORAL | Status: DC | PRN

## 2015-05-17 MED ORDER — PRAVASTATIN SODIUM 40 MG PO TABS
40.0000 mg | ORAL_TABLET | Freq: Every evening | ORAL | Status: DC
  Administered 2015-05-17 – 2015-05-18 (×2): 40 mg via ORAL
  Filled 2015-05-17 (×2): qty 1

## 2015-05-17 MED ORDER — MOMETASONE FURO-FORMOTEROL FUM 100-5 MCG/ACT IN AERO
2.0000 | INHALATION_SPRAY | Freq: Two times a day (BID) | RESPIRATORY_TRACT | Status: DC
  Administered 2015-05-17 – 2015-05-19 (×4): 2 via RESPIRATORY_TRACT
  Filled 2015-05-17: qty 8.8

## 2015-05-17 MED ORDER — CALCIUM ACETATE (PHOS BINDER) 667 MG/5ML PO SOLN
2001.0000 mg | Freq: Three times a day (TID) | ORAL | Status: DC
  Filled 2015-05-17 (×2): qty 15

## 2015-05-17 MED ORDER — VANCOMYCIN HCL 10 G IV SOLR
1500.0000 mg | Freq: Once | INTRAVENOUS | Status: DC
  Filled 2015-05-17: qty 1500

## 2015-05-17 MED ORDER — ALBUTEROL SULFATE (2.5 MG/3ML) 0.083% IN NEBU
5.0000 mg | INHALATION_SOLUTION | Freq: Once | RESPIRATORY_TRACT | Status: AC
  Administered 2015-05-17: 5 mg via RESPIRATORY_TRACT
  Filled 2015-05-17: qty 6

## 2015-05-17 MED ORDER — CALCITRIOL 0.25 MCG PO CAPS
0.2500 ug | ORAL_CAPSULE | ORAL | Status: DC
  Administered 2015-05-18: 0.25 ug via ORAL
  Filled 2015-05-17: qty 1

## 2015-05-17 MED ORDER — HEPARIN SODIUM (PORCINE) 5000 UNIT/ML IJ SOLN
5000.0000 [IU] | Freq: Three times a day (TID) | INTRAMUSCULAR | Status: DC
  Administered 2015-05-17 – 2015-05-19 (×4): 5000 [IU] via SUBCUTANEOUS
  Filled 2015-05-17 (×2): qty 1

## 2015-05-17 MED ORDER — CALCIUM ACETATE (PHOS BINDER) 667 MG PO CAPS
2001.0000 mg | ORAL_CAPSULE | Freq: Three times a day (TID) | ORAL | Status: DC
  Administered 2015-05-17 – 2015-05-19 (×6): 2001 mg via ORAL
  Filled 2015-05-17 (×6): qty 3

## 2015-05-17 MED ORDER — IPRATROPIUM-ALBUTEROL 0.5-2.5 (3) MG/3ML IN SOLN
3.0000 mL | Freq: Once | RESPIRATORY_TRACT | Status: AC
  Administered 2015-05-17: 3 mL via RESPIRATORY_TRACT
  Filled 2015-05-17: qty 3

## 2015-05-17 MED ORDER — ASPIRIN EC 81 MG PO TBEC
81.0000 mg | DELAYED_RELEASE_TABLET | Freq: Every day | ORAL | Status: DC
  Administered 2015-05-18 – 2015-05-19 (×2): 81 mg via ORAL
  Filled 2015-05-17 (×2): qty 1

## 2015-05-17 MED ORDER — PIPERACILLIN SOD-TAZOBACTAM SO 2.25 (2-0.25) G IV SOLR
2.2500 g | Freq: Three times a day (TID) | INTRAVENOUS | Status: DC
  Administered 2015-05-17 – 2015-05-18 (×2): 2.25 g via INTRAVENOUS
  Filled 2015-05-17 (×5): qty 2.25

## 2015-05-17 MED ORDER — RENA-VITE PO TABS
1.0000 | ORAL_TABLET | Freq: Every day | ORAL | Status: DC
  Administered 2015-05-17 – 2015-05-18 (×2): 1 via ORAL
  Filled 2015-05-17: qty 1

## 2015-05-17 MED ORDER — VANCOMYCIN HCL IN DEXTROSE 1-5 GM/200ML-% IV SOLN: 1000.0000 mg | INTRAVENOUS | Status: DC

## 2015-05-17 MED ORDER — IPRATROPIUM-ALBUTEROL 0.5-2.5 (3) MG/3ML IN SOLN
3.0000 mL | Freq: Four times a day (QID) | RESPIRATORY_TRACT | Status: DC
  Administered 2015-05-17 – 2015-05-18 (×4): 3 mL via RESPIRATORY_TRACT
  Filled 2015-05-17 (×5): qty 3

## 2015-05-17 NOTE — ED Notes (Signed)
Pt starting having chest pain accompanied by sob last night. Pt is a dialysis pt who gets dialysis on M,W,F. He had extra fluid on him so went to dialysis today as well.  Pt states his shortness of breath worsens with exertion.

## 2015-05-17 NOTE — Progress Notes (Addendum)
Pharmacy Antibiotic Note  Kirk [name] is a 55 y.o. male admitted on 05/17/2015 with pneumonia.  Pharmacy has been consulted for zosyn dosing. ESRD on HD every MWF. Plan: Zosyn 3.375 gm IV X 1 then 2.25 gm IV q8 hours Vancomycin 1500mg  IV given x1 in ED, continue with 100mg  IV after HD F/u cultures and clinical course  Height: 5\' 7"  (170.2 cm) Weight: 233 lb (105.688 kg) IBW/kg (Calculated) : 66.1  Temp (24hrs), Avg:99.8 F (37.7 C), Min:98.8 F (37.1 C), Max:100.7 F (38.2 C)   Recent Labs Lab 05/17/15 1300 05/17/15 1346  WBC 5.4  --   CREATININE 7.71*  --   LATICACIDVEN  --  1.78    Estimated Creatinine Clearance: 12.8 mL/min (by C-G formula based on Cr of 7.71).    Allergies  Allergen Reactions  . Codeine Nausea And Vomiting and Nausea Only    Antimicrobials this admission: Vancomycin 4/25>> zosyn 4/25>>    Thank you for allowing pharmacy to be a part of this patient's care.  Isac Sarna, BS Pharm D, [place] Clinical Pharmacist Pager [phone]  05/17/2015 3:02 PM

## 2015-05-17 NOTE — ED Provider Notes (Signed)
CSN: [id number]     Arrival date & time 05/17/15  1245 History   First MD Initiated Contact with Patient 05/17/15 1250     Chief Complaint  Patient presents with  . Chest Pain     (Consider location/radiation/quality/duration/timing/severity/associated sxs/prior Treatment) HPI Comments: 55 year old male with history of high blood pressure, diabetes, anemia, dialysis dialyzed earlier today 4 L, sleep apnea, see Pap at home no oxygen during the day, failed kidney transplant the past presents with worsening shortness of breath despite completing dialysis. Patient states worse with exertion mild lying flat. Patient is also had a cough and pneumonia history. Subjective fever at home no documented temperature. No blood clot history, no unilateral leg swelling. Patient has mild intermittent sharp chest pain since yesterday very brief lasting seconds. Denies pleuritic component. Denies recent surgery or recent hospitalization.  Patient is a 55 y.o. male presenting with chest pain. The history is provided by the patient.  Chest Pain Associated symptoms: shortness of breath   Associated symptoms: no abdominal pain, no back pain, no fever, no headache and not vomiting     Past Medical History  Diagnosis Date  . Anxiety   . Chronic headaches   . HTN (hypertension)   . Shortness of breath   . Impotence of organic origin   . Other malaise and fatigue   . Debility, unspecified   . Cellulitis and abscess of trunk   . Kidney replaced by transplant   . Acute edema of lung, unspecified   . Unspecified constipation   . Other chronic nonalcoholic liver disease   . Localization-related (focal) (partial) epilepsy and epileptic syndromes with complex partial seizures, without mention of intractable epilepsy   . Tension headache   . Dermatophytosis of the body   . Gout, unspecified   . Pain in joint, upper arm   . Pain in joint, lower leg   . Other nonspecific abnormal serum enzyme levels   . Essential  hypertension, benign   . Acute, but ill-defined, cerebrovascular disease ([place])   . Memory loss   . Hypotension, unspecified   . Hypertrophy of prostate without urinary obstruction and other lower urinary tract symptoms (LUTS)   . Renal dialysis status(V45.11) 02/05/2010    restarted 01/02/13 ofter renal trransplant failure  . Insomnia, unspecified   . Secondary hyperparathyroidism (of renal origin)   . Lumbago   . Unspecified vitamin D deficiency   . Carpal tunnel syndrome   . Unspecified hereditary and idiopathic peripheral neuropathy   . Other and unspecified hyperlipidemia   . Unspecified essential hypertension   . Edema   . Cholelithiasis 07/13/2014  . Dysrhythmia     history of  . Asthma   . End stage renal disease on dialysis [facility])     "MWF; [facility]" (10/21/2014)  . 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)   . OSA on CPAP   . Pneumonia "several times"   Past Surgical History  Procedure Laterality Date  . 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 Colonoscopy    . Back surgery    . Kidney transplant  08/17/2011    [facility]   . Av fistula placement Left ?2010    "forearm; at [facility]"  . Left heart catheterization with coronary angiogram N/A 03/21/2011    Procedure: LEFT HEART CATHETERIZATION WITH CORONARY ANGIOGRAM;  Surgeon: [name], MD;  Location: [facility] CATH LAB;  Service: Cardiovascular;  Laterality: N/A;  . Posterior fusion  cervical spine  06/25/2012    for spinal stenosis  . Nephrectomy  08/2013    removed transplaned kidney  . Cardiac catheterization  03/21/2011  . Laparoscopic cholecystectomy  10/21/2014    w/IOC  . Vasectomy  2010  . Cholecystectomy N/A 10/21/2014    Procedure: LAPAROSCOPIC CHOLECYSTECTOMY WITH INTRAOPERATIVE CHOLANGIOGRAM;  Surgeon: Autumn Messing III, MD;  Location: [place];  Service: General;  Laterality: N/A;   Family History  Problem Relation Age of Onset  . Adopted: Yes  . Colon cancer  Neg Hx   . Esophageal cancer Neg Hx   . Rectal cancer Neg Hx   . Stomach cancer Neg Hx    Social History  Substance Use Topics  . Smoking status: Former Smoker -- 0.50 packs/day for 32 years    Types: Cigarettes    Quit date: 08/23/2014  . Smokeless tobacco: Never Used  . Alcohol Use: No    Review of Systems  Constitutional: Negative for fever and chills.  HENT: Negative for congestion.   Eyes: Negative for visual disturbance.  Respiratory: Positive for shortness of breath.   Cardiovascular: Positive for chest pain.  Gastrointestinal: Negative for vomiting and abdominal pain.  Genitourinary: Negative for dysuria and flank pain.  Musculoskeletal: Negative for back pain, neck pain and neck stiffness.  Skin: Negative for rash.  Neurological: Negative for light-headedness and headaches.      Allergies  Codeine  Home Medications   Prior to Admission medications   Medication Sig Start Date End Date Taking? Authorizing Provider  acetaminophen (TYLENOL) 500 MG tablet Take 1 tablet (500 mg total) by mouth every 6 (six) hours as needed for moderate pain. 09/12/14  Yes Samuella Cota, MD  amLODipine (NORVASC) 10 MG tablet Take 1 tablet (10 mg total) by mouth daily. 02/08/15  Yes Estill Dooms, MD  aspirin EC 81 MG tablet Take 81 mg by mouth daily.   Yes Historical Provider, MD  b complex-vitamin c-folic acid (NEPHRO-VITE) 0.8 MG TABS tablet Take 1 tablet by mouth daily. 02/08/15  Yes Estill Dooms, MD  budesonide-formoterol (SYMBICORT) 80-4.5 MCG/ACT inhaler Inhale 2 puffs into the lungs 2 (two) times daily. 09/21/14  Yes Bonnielee Haff, MD  calcitRIOL (ROCALTROL) 0.25 MCG capsule Take 1 capsule (0.25 mcg total) by mouth every Monday, Wednesday, and Friday with hemodialysis. 09/21/14  Yes Bonnielee Haff, MD  calcium acetate, Phos Binder, (PHOSLYRA) 667 MG/5ML SOLN Take 2,001 mg by mouth 3 (three) times daily with meals. And 667 mg with snack   Yes Historical Provider, MD  carvedilol  (COREG) 25 MG tablet Take 1 tablet (25 mg total) by mouth 2 (two) times daily. Take one tablet in the morning and one tablet in the evening 02/08/15  Yes Estill Dooms, MD  Cholecalciferol (VITAMIN D3) 5000 UNITS TABS Take 1 tablet by mouth daily.   Yes Historical Provider, MD  diphenhydrAMINE (BENADRYL) 25 MG tablet Take 50 mg by mouth 2 (two) times daily.    Yes Historical Provider, MD  doxazosin (CARDURA) 4 MG tablet Take 0.5 tablet  by mouth in the am and 0.5 tablet by mouth in the pm to regulate blood pressure. 02/08/15  Yes Estill Dooms, MD  guaiFENesin-dextromethorphan (ROBITUSSIN DM) 100-10 MG/5ML syrup Take 10 mLs by mouth every 4 (four) hours as needed for cough.   Yes Historical Provider, MD  omeprazole (PRILOSEC) 20 MG capsule Take 20 mg by mouth daily.   Yes Historical Provider, MD  oxyCODONE-acetaminophen (PERCOCET/ROXICET) 5-325 MG tablet Take  1-2 tablets by mouth every 8 (eight) hours as needed for severe pain. 02/08/15  Yes Estill Dooms, MD  pravastatin (PRAVACHOL) 40 MG tablet Take 1 tablet (40 mg total) by mouth every evening. 02/08/15  Yes Estill Dooms, MD  promethazine (PHENERGAN) 25 MG tablet Take 1 tablet (25 mg total) by mouth every 8 (eight) hours as needed for nausea or vomiting. 07/13/14  Yes Estill Dooms, MD  traMADol Veatrice Bourbon) 50 MG tablet One up to 4 times daly if needed for pain 05/10/15  Yes Estill Dooms, MD   BP 124/67 mmHg  Pulse 87  Temp(Src) 100.7 F (38.2 C) (Rectal)  Resp 28  Ht 5\' 7"  (1.702 m)  Wt 233 lb (105.688 kg)  BMI 36.48 kg/m2  SpO2 92% Physical Exam  Constitutional: He is oriented to person, place, and time. He appears well-developed and well-nourished.  HENT:  Head: Normocephalic and atraumatic.  Eyes: Right eye exhibits no discharge. Left eye exhibits no discharge.  Neck: Normal range of motion. Neck supple. No tracheal deviation present.  Cardiovascular: Normal rate and regular rhythm.   Pulmonary/Chest: Effort normal. He has rales  (mild crackles bilateral lower worse in the left, mild end expiratory wheeze middle lung fields.).  Abdominal: Soft. He exhibits no distension. There is no tenderness. There is no guarding.  Musculoskeletal: He exhibits edema (minimal lower extremity ankles bilateral).  Neurological: He is alert and oriented to person, place, and time.  Skin: Skin is warm. No rash noted.  Psychiatric: He has a normal mood and affect.  Nursing note and vitals reviewed.   ED Course  Procedures (including critical care time) Labs Review Labs Reviewed  BASIC METABOLIC PANEL - Abnormal; Notable for the following:    Chloride 95 (*)    Glucose, Bld 132 (*)    BUN 23 (*)    Creatinine, Ser 7.71 (*)    GFR calc non Af Amer 7 (*)    GFR calc Af Amer 8 (*)    All other components within normal limits  CBC - Abnormal; Notable for the following:    RBC 3.51 (*)    Hemoglobin 12.0 (*)    HCT 35.6 (*)    MCV 101.4 (*)    MCH 34.2 (*)    Platelets 99 (*)    All other components within normal limits  BRAIN NATRIURETIC PEPTIDE - Abnormal; Notable for the following:    B Natriuretic Peptide 143.0 (*)    All other components within normal limits  TROPONIN I  INFLUENZA PANEL BY PCR (TYPE A & B, H1N1)  I-STAT CG4 LACTIC ACID, ED    Imaging Review Dg Chest 2 View  05/17/2015  CLINICAL DATA:  Cough and fever for 3 days.  Hemodialysis patient. EXAM: CHEST  2 VIEW COMPARISON:  08/11/2014 FINDINGS: The heart size and mediastinal contours are within normal limits. Both lungs are clear. The visualized skeletal structures are unremarkable. Lower cervical fusion hardware noted. No significant interval change. Stable exam. IMPRESSION: No active cardiopulmonary disease. Electronically Signed   By: Jerilynn Mages.  Shick M.D.   On: 05/17/2015 14:01   I have personally reviewed and evaluated these images and lab results as part of my medical decision-making.   EKG Interpretation   Date/Time:  Tuesday May 17 2015 12:53:47  EDT Ventricular Rate:  92 PR Interval:  154 QRS Duration: 97 QT Interval:  372 QTC Calculation: 460 R Axis:   77 Text Interpretation:  Sinus rhythm Baseline wander in lead(s) V4  V6  overall similar previous Confirmed by Karina Nofsinger  MD, Mallori Araque ([id number]) on  05/17/2015 12:56:50 PM      MDM   Final diagnoses:  Acute dyspnea  Hypoxia  Fever  Patient with dialysis history presents with worsening shortness of breath since last night. Clinical concern for pneumonia versus pulmonary edema that did not respond to dialysis. Patient also has mild wheezing and asthma history. Plan for DuoNeb, screening cardiac/pulmonary workup. Patient requiring oxygen in the ER.  With rales on exam, fever and hypoxia antibiotics ordered for pneumonia. Patient is dialysis patient with fever plan for broad coverage initially.  Chest x-ray did not show an obvious infiltrate, CT scan ordered for further details. Plan for admission for hypoxia, clinical concern for pneumonia vs influenza.  The patients results and plan were reviewed and discussed.   Any x-rays performed were independently reviewed by myself.   Differential diagnosis were considered with the presenting HPI.  Medications  vancomycin (VANCOCIN) 1,500 mg in sodium chloride 0.9 % 500 mL IVPB (not administered)  piperacillin-tazobactam (ZOSYN) IVPB 3.375 g (3.375 g Intravenous New Bag/Given 05/17/15 1505)  piperacillin-tazobactam (ZOSYN) 2.25 g in dextrose 5 % 50 mL IVPB (not administered)  albuterol (PROVENTIL) (2.5 MG/3ML) 0.083% nebulizer solution 5 mg (5 mg Nebulization Given 05/17/15 1325)  ipratropium-albuterol (DUONEB) 0.5-2.5 (3) MG/3ML nebulizer solution 3 mL (3 mLs Nebulization Given 05/17/15 1327)    Filed Vitals:   05/17/15 1327 05/17/15 1332 05/17/15 1344 05/17/15 1436  BP:  104/45  124/67  Pulse:  88  87  Temp:   100.7 F (38.2 C)   TempSrc:   Rectal   Resp:    28  Height:      Weight:      SpO2: 95% 94%  92%    Final diagnoses:   Acute dyspnea  CAP (community acquired pneumonia)  Hypoxia    Admission/ observation were discussed with the admitting physician, patient and/or family and they are comfortable with the plan.    Elnora Morrison, MD 05/17/15 1524

## 2015-05-17 NOTE — H&P (Addendum)
[facility] History and Physical  Kirk [name] [id number] DOB: Mar 12, 1960 DOA: 05/17/2015  Referring physician: Dr Reather [facility] PCP: Kirk Dooms, MD   Chief Complaint: Cough, myalgias, SOB  HPI: Kirk [name] is a 55 y.o. male with history of PNA, HTN and ESRD on HD MWF at [place].  Patient presented to ED today with 3-4 day hx of myalgia, chills, sweats, nonprod cough and SOB.  DOE.  Several days ago he began to feel bad with malaise and myalgias w coughing.  Then 2 days ago after Monday 's HD he had increased SOB and DOE.  Did neb Rx at home Mon night.  Also started having sharp frontal HA's.  Went for extra HD yesterday due to SOB, they took off "4 kg" on Mon and Tuesday. No sick family, no abd pain, n/v/d, no dysuria.  In eD temp 100.6, CXR clear and CT chest showed no infiltrates or edema.  +rales/ wheezing per ED exam so started on empiric PNA antibiotic coverage.  SaO2 95% on 2L [place].  +hx PNA 2-3 times in the last couple of years. +tobacco, no etoh.    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 Started HD 2012. Had renal transplant Jul 2013 until Aug 2015 when transplant removed due to acute rejection, this per family. Using L AVF as access.    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  All to codeine.  Like to fish in his sparetime.  3 children, ages 63, 57, and an 53 yo daughter who is here today.   ROS  denies CP  no joint pain   no blurry vision  no rash  no diarrhea  no nausea/ vomiting  no dysuria  no difficulty voiding  no change in urine color    Where does patient live home Can patient participate in ADLs? yes  Past Medical History  Past Medical History  Diagnosis Date  . Anxiety   . Chronic headaches   . HTN (hypertension)   . Shortness of breath   .  Impotence of organic origin   . Other malaise and fatigue   . Debility, unspecified   . Cellulitis and abscess of trunk   . Kidney replaced by transplant   . Acute edema of lung, unspecified   . Unspecified constipation   . Other chronic nonalcoholic liver disease   . Localization-related (focal) (partial) epilepsy and epileptic syndromes with complex partial seizures, without mention of intractable epilepsy   . Tension headache   . Dermatophytosis of the body   . Gout, unspecified   . Pain in joint, upper arm   . Pain in joint, lower leg   . Other nonspecific abnormal serum enzyme levels   . Essential hypertension, benign   . Acute, but ill-defined, cerebrovascular disease ([place])   . Memory loss   . Hypotension, unspecified   . Hypertrophy of prostate without urinary obstruction and other lower urinary tract symptoms (LUTS)   . Renal dialysis status(V45.11) 02/05/2010    restarted 01/02/13 ofter renal trransplant failure  . Insomnia, unspecified   . Secondary hyperparathyroidism (of renal origin)   . Lumbago   . Unspecified vitamin D deficiency   . Carpal tunnel syndrome   . Unspecified hereditary and idiopathic peripheral  neuropathy   . Other and unspecified hyperlipidemia   . Unspecified essential hypertension   . Edema   . Cholelithiasis 07/13/2014  . Dysrhythmia     history of  . Asthma   . End stage renal disease on dialysis [facility])     "MWF; [facility]" (10/21/2014)  . 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)   . OSA on CPAP   . Pneumonia "several times"   Past Surgical History  Past Surgical History  Procedure Laterality Date  . 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 Colonoscopy    . Back surgery    . Kidney transplant  08/17/2011    [facility]   . Av fistula placement Left ?2010    "forearm; at [facility]"  . Left heart catheterization with coronary angiogram N/A 03/21/2011    Procedure: LEFT HEART  CATHETERIZATION WITH CORONARY ANGIOGRAM;  Surgeon: [name], MD;  Location: [facility] CATH LAB;  Service: Cardiovascular;  Laterality: N/A;  . Posterior fusion cervical spine  06/25/2012    for spinal stenosis  . Nephrectomy  08/2013    removed transplaned kidney  . Cardiac catheterization  03/21/2011  . Laparoscopic cholecystectomy  10/21/2014    w/IOC  . Vasectomy  2010  . Cholecystectomy N/A 10/21/2014    Procedure: LAPAROSCOPIC CHOLECYSTECTOMY WITH INTRAOPERATIVE CHOLANGIOGRAM;  Surgeon: Autumn Messing III, MD;  Location: [place];  Service: General;  Laterality: N/A;   Family History  Family History  Problem Relation Age of Onset  . Adopted: Yes  . Colon cancer Neg Hx   . Esophageal cancer Neg Hx   . Rectal cancer Neg Hx   . Stomach cancer Neg Hx    Social History  reports that he quit smoking about 8 months ago. His smoking use included Cigarettes. He has a 16 pack-year smoking history. He has never used smokeless tobacco. He reports that he does not drink alcohol or use illicit drugs. Allergies  Allergies  Allergen Reactions  . Codeine Nausea And Vomiting and Nausea Only   Home medications Prior to Admission medications   Medication Sig Start Date End Date Taking? Authorizing Provider  acetaminophen (TYLENOL) 500 MG tablet Take 1 tablet (500 mg total) by mouth every 6 (six) hours as needed for moderate pain. 09/12/14  Yes Samuella Cota, MD  amLODipine (NORVASC) 10 MG tablet Take 1 tablet (10 mg total) by mouth daily. 02/08/15  Yes Kirk Dooms, MD  aspirin EC 81 MG tablet Take 81 mg by mouth daily.   Yes Historical Provider, MD  b complex-vitamin c-folic acid (NEPHRO-VITE) 0.8 MG TABS tablet Take 1 tablet by mouth daily. 02/08/15  Yes Kirk Dooms, MD  budesonide-formoterol (SYMBICORT) 80-4.5 MCG/ACT inhaler Inhale 2 puffs into the lungs 2 (two) times daily. 09/21/14  Yes Bonnielee Haff, MD  calcitRIOL (ROCALTROL) 0.25 MCG capsule Take 1 capsule (0.25 mcg total) by mouth every  Monday, Wednesday, and Friday with hemodialysis. 09/21/14  Yes Bonnielee Haff, MD  calcium acetate, Phos Binder, (PHOSLYRA) 667 MG/5ML SOLN Take 2,001 mg by mouth 3 (three) times daily with meals. And 667 mg with snack   Yes Historical Provider, MD  carvedilol (COREG) 25 MG tablet Take 1 tablet (25 mg total) by mouth 2 (two) times daily. Take one tablet in the morning and one tablet in the evening 02/08/15  Yes Kirk Dooms, MD  Cholecalciferol (VITAMIN D3) 5000 UNITS TABS Take 1 tablet by mouth daily.  Yes Historical Provider, MD  diphenhydrAMINE (BENADRYL) 25 MG tablet Take 50 mg by mouth 2 (two) times daily.    Yes Historical Provider, MD  doxazosin (CARDURA) 4 MG tablet Take 0.5 tablet  by mouth in the am and 0.5 tablet by mouth in the pm to regulate blood pressure. 02/08/15  Yes Kirk Dooms, MD  guaiFENesin-dextromethorphan (ROBITUSSIN DM) 100-10 MG/5ML syrup Take 10 mLs by mouth every 4 (four) hours as needed for cough.   Yes Historical Provider, MD  omeprazole (PRILOSEC) 20 MG capsule Take 20 mg by mouth daily.   Yes Historical Provider, MD  oxyCODONE-acetaminophen (PERCOCET/ROXICET) 5-325 MG tablet Take 1-2 tablets by mouth every 8 (eight) hours as needed for severe pain. 02/08/15  Yes Kirk Dooms, MD  pravastatin (PRAVACHOL) 40 MG tablet Take 1 tablet (40 mg total) by mouth every evening. 02/08/15  Yes Kirk Dooms, MD  promethazine (PHENERGAN) 25 MG tablet Take 1 tablet (25 mg total) by mouth every 8 (eight) hours as needed for nausea or vomiting. 07/13/14  Yes Kirk Dooms, MD  traMADol Veatrice Bourbon) 50 MG tablet One up to 4 times daly if needed for pain 05/10/15  Yes Kirk Dooms, MD   Liver Function Tests No results for input(s): AST, ALT, ALKPHOS, BILITOT, PROT, ALBUMIN in the last 168 hours. No results for input(s): LIPASE, AMYLASE in the last 168 hours. CBC  Recent Labs Lab 05/17/15 1300  WBC 5.4  HGB 12.0*  HCT 35.6*  MCV 101.4*  PLT 99*   Basic Metabolic  Panel  Recent Labs Lab 05/17/15 1300  NA 139  K 4.1  CL 95*  CO2 31  GLUCOSE 132*  BUN 23*  CREATININE 7.71*  CALCIUM 9.5     Filed Vitals:   05/17/15 1327 05/17/15 1332 05/17/15 1344 05/17/15 1436  BP:  104/45  124/67  Pulse:  88  87  Temp:   100.7 F (38.2 C)   TempSrc:   Rectal   Resp:    28  Height:      Weight:      SpO2: 95% 94%  92%   Exam: VSS: heavy, obese Asian male no distress, looks flushed, dry cough No rash, cyanosis or gangrene Sclera anicteric, throat clear  No jvd or bruits Chest mild occ crackles bilat bases, mild diffuse exp wheezing, good air movement on insp RRR no MRG Abd soft ntnd no mass or ascites +bs obese GU normal male MS no joint effusions or deformity Ext no LE or UE edema / no wounds or ulcers Neuro is alert, Ox 3 , nf LFA AVF +bruit   EKG (independently reviewed) > NSR no acute changes CXR (independently reviewed) > no active disease CT chest (indep reviewed) > no active disease, lung parenchyma is diffusely clear, no emphysema/ consolidation or ground glass changes  Home medications: Norvasc, cardura, coreg Phoslo, asa, symbicort, prilosed, pravastatin Ultram prn  Na 139 K 4.1  BUN 23 Creat 7.7  Ca 9.5  Glu 132  Trop < 0.03 WBC 5k  Hb 12 plt 99  Assessment: 1. Fever/ cough/ rales and wheezing/hpyoxemia - clear CXR/ chest CT.  Possible early PNA vs viral pneumonitis.  Hypoxic on RA. Plan admit, IV abx pending cultures and resp PCR panel results.  Eval for flu.  Nebs and O2 support.  OSA/OHS may be contributing to low O2 levels 2. ESRD - call renal for HD while here 3. HTN bp's soft, hold meds for now, defer to renal 4.  OSA on CPAP  Plan - as above   DVT Prophylaxis SQ hep  Code Status: full  Family Communication: here  Disposition Plan: not sure yet    [name] [facility] Pager [phone]  Cell [phone]  If 7PM-7AM, please contact night-coverage [email or web address] Password [email or web address] 05/17/2015, 4:00  PM

## 2015-05-18 ENCOUNTER — Other Ambulatory Visit: Payer: Self-pay | Admitting: Internal Medicine

## 2015-05-18 DIAGNOSIS — Z6836 Body mass index (BMI) 36.0-36.9, adult: Secondary | ICD-10-CM | POA: Diagnosis not present

## 2015-05-18 DIAGNOSIS — R0902 Hypoxemia: Secondary | ICD-10-CM

## 2015-05-18 DIAGNOSIS — Z992 Dependence on renal dialysis: Principal | ICD-10-CM

## 2015-05-18 DIAGNOSIS — E662 Morbid (severe) obesity with alveolar hypoventilation: Secondary | ICD-10-CM | POA: Diagnosis present

## 2015-05-18 DIAGNOSIS — Z7982 Long term (current) use of aspirin: Secondary | ICD-10-CM | POA: Diagnosis not present

## 2015-05-18 DIAGNOSIS — J9601 Acute respiratory failure with hypoxia: Secondary | ICD-10-CM | POA: Diagnosis present

## 2015-05-18 DIAGNOSIS — N186 End stage renal disease: Secondary | ICD-10-CM | POA: Diagnosis not present

## 2015-05-18 DIAGNOSIS — J189 Pneumonia, unspecified organism: Secondary | ICD-10-CM | POA: Diagnosis not present

## 2015-05-18 DIAGNOSIS — J209 Acute bronchitis, unspecified: Secondary | ICD-10-CM | POA: Diagnosis present

## 2015-05-18 DIAGNOSIS — Z940 Kidney transplant status: Secondary | ICD-10-CM | POA: Diagnosis not present

## 2015-05-18 DIAGNOSIS — E1122 Type 2 diabetes mellitus with diabetic chronic kidney disease: Secondary | ICD-10-CM | POA: Diagnosis present

## 2015-05-18 DIAGNOSIS — D631 Anemia in chronic kidney disease: Secondary | ICD-10-CM | POA: Diagnosis present

## 2015-05-18 DIAGNOSIS — I10 Essential (primary) hypertension: Secondary | ICD-10-CM

## 2015-05-18 DIAGNOSIS — Z905 Acquired absence of kidney: Secondary | ICD-10-CM | POA: Diagnosis not present

## 2015-05-18 DIAGNOSIS — H9190 Unspecified hearing loss, unspecified ear: Secondary | ICD-10-CM | POA: Diagnosis present

## 2015-05-18 DIAGNOSIS — Z87891 Personal history of nicotine dependence: Secondary | ICD-10-CM | POA: Diagnosis not present

## 2015-05-18 DIAGNOSIS — Z7951 Long term (current) use of inhaled steroids: Secondary | ICD-10-CM | POA: Diagnosis not present

## 2015-05-18 DIAGNOSIS — J129 Viral pneumonia, unspecified: Secondary | ICD-10-CM | POA: Diagnosis not present

## 2015-05-18 DIAGNOSIS — Z8701 Personal history of pneumonia (recurrent): Secondary | ICD-10-CM | POA: Diagnosis not present

## 2015-05-18 DIAGNOSIS — M791 Myalgia: Secondary | ICD-10-CM | POA: Diagnosis present

## 2015-05-18 DIAGNOSIS — Z79899 Other long term (current) drug therapy: Secondary | ICD-10-CM | POA: Diagnosis not present

## 2015-05-18 DIAGNOSIS — I120 Hypertensive chronic kidney disease with stage 5 chronic kidney disease or end stage renal disease: Secondary | ICD-10-CM | POA: Diagnosis present

## 2015-05-18 LAB — BASIC METABOLIC PANEL
Anion gap: 15 (ref 5–15)
BUN: 45 mg/dL — AB (ref 6–20)
CHLORIDE: 95 mmol/L — AB (ref 101–111)
CO2: 30 mmol/L (ref 22–32)
Calcium: 9.1 mg/dL (ref 8.9–10.3)
Creatinine, Ser: 11.04 mg/dL — ABNORMAL HIGH (ref 0.61–1.24)
GFR calc Af Amer: 5 mL/min — ABNORMAL LOW (ref >60)
GFR calc non Af Amer: 5 mL/min — ABNORMAL LOW (ref >60)
Glucose, Bld: 141 mg/dL — ABNORMAL HIGH (ref 65–99)
POTASSIUM: 3.9 mmol/L (ref 3.5–5.1)
SODIUM: 140 mmol/L (ref 135–145)

## 2015-05-18 LAB — CBC
HEMATOCRIT: 31.8 % — AB (ref 39.0–52.0)
HEMOGLOBIN: 10.7 g/dL — AB (ref 13.0–17.0)
MCH: 33.9 pg (ref 26.0–34.0)
MCHC: 33.6 g/dL (ref 30.0–36.0)
MCV: 100.6 fL — AB (ref 78.0–100.0)
Platelets: 91 10*3/uL — ABNORMAL LOW (ref 150–400)
RBC: 3.16 MIL/uL — AB (ref 4.22–5.81)
RDW: 13.6 % (ref 11.5–15.5)
WBC: 3.6 10*3/uL — ABNORMAL LOW (ref 4.0–10.5)

## 2015-05-18 MED ORDER — IPRATROPIUM-ALBUTEROL 0.5-2.5 (3) MG/3ML IN SOLN
3.0000 mL | Freq: Three times a day (TID) | RESPIRATORY_TRACT | Status: DC
  Administered 2015-05-19: 3 mL via RESPIRATORY_TRACT
  Filled 2015-05-18: qty 3

## 2015-05-18 MED ORDER — EPOETIN ALFA 4000 UNIT/ML IJ SOLN
INTRAMUSCULAR | Status: AC
  Administered 2015-05-18: 2000 [IU]
  Filled 2015-05-18: qty 1

## 2015-05-18 MED ORDER — SODIUM CHLORIDE 0.9 % IV SOLN: 100.0000 mL | INTRAVENOUS | Status: DC | PRN

## 2015-05-18 MED ORDER — SODIUM CHLORIDE 0.9 % IV SOLN
1500.0000 mg | Freq: Once | INTRAVENOUS | Status: DC
  Filled 2015-05-18: qty 1500

## 2015-05-18 MED ORDER — LEVOFLOXACIN IN D5W 500 MG/100ML IV SOLN: 500.0000 mg | INTRAVENOUS | Status: DC

## 2015-05-18 MED ORDER — LIDOCAINE HCL (PF) 1 % IJ SOLN: 5.0000 mL | INTRAMUSCULAR | Status: DC | PRN

## 2015-05-18 MED ORDER — VANCOMYCIN HCL 10 G IV SOLR
1500.0000 mg | INTRAVENOUS | Status: DC
  Filled 2015-05-18: qty 1500

## 2015-05-18 MED ORDER — LEVOFLOXACIN IN D5W 750 MG/150ML IV SOLN
750.0000 mg | Freq: Once | INTRAVENOUS | Status: AC
  Administered 2015-05-18: 750 mg via INTRAVENOUS
  Filled 2015-05-18: qty 150

## 2015-05-18 MED ORDER — PENTAFLUOROPROP-TETRAFLUOROETH EX AERO: 1.0000 "application " | INHALATION_SPRAY | CUTANEOUS | Status: DC | PRN

## 2015-05-18 MED ORDER — HEPARIN SODIUM (PORCINE) 1000 UNIT/ML IJ SOLN
INTRAMUSCULAR | Status: AC
  Administered 2015-05-18: 2000 [IU] via INTRAVENOUS_CENTRAL
  Filled 2015-05-18: qty 2

## 2015-05-18 MED ORDER — HEPARIN SODIUM (PORCINE) 1000 UNIT/ML DIALYSIS
20.0000 [IU]/kg | INTRAMUSCULAR | Status: DC | PRN
  Administered 2015-05-18: 2000 [IU] via INTRAVENOUS_CENTRAL

## 2015-05-18 MED ORDER — METHYLPREDNISOLONE SODIUM SUCC 40 MG IJ SOLR
40.0000 mg | INTRAMUSCULAR | Status: DC
  Administered 2015-05-18: 40 mg via INTRAVENOUS
  Filled 2015-05-18: qty 1

## 2015-05-18 MED ORDER — LIDOCAINE-PRILOCAINE 2.5-2.5 % EX CREA: 1.0000 "application " | TOPICAL_CREAM | CUTANEOUS | Status: DC | PRN

## 2015-05-18 MED ORDER — VANCOMYCIN HCL IN DEXTROSE 1-5 GM/200ML-% IV SOLN: 1000.0000 mg | INTRAVENOUS | Status: DC

## 2015-05-18 MED ORDER — EPOETIN ALFA 2000 UNIT/ML IJ SOLN
2000.0000 [IU] | INTRAMUSCULAR | Status: DC
  Administered 2015-05-18: 2000 [IU] via SUBCUTANEOUS

## 2015-05-18 NOTE — Progress Notes (Signed)
Pharmacy Antibiotic Note  Kirk [name] is a 55 y.o. male admitted on 05/17/2015 with pneumonia.  Pharmacy has been consulted for levaquin dosing. He was started on vanc and zosyn yesterday.  Vanc dose was never given  Plan: Levaquin 750 mg IV X 1 then 500 mg IV q48 hours F/u cultures and clinical course  Height: 5\' 7"  (170.2 cm) Weight: 223 lb (101.152 kg) IBW/kg (Calculated) : 66.1  Temp (24hrs), Avg:99 F (37.2 C), Min:98.1 F (36.7 C), Max:100.7 F (38.2 C)   Recent Labs Lab 05/17/15 1300 05/17/15 1346 05/18/15 0603  WBC 5.4  --  3.6*  CREATININE 7.71*  --  11.04*  LATICACIDVEN  --  1.78  --     Estimated Creatinine Clearance: 8.8 mL/min (by C-G formula based on Cr of 11.04).    Allergies  Allergen Reactions  . Codeine Nausea And Vomiting and Nausea Only    Antimicrobials this admission: 4/25 zosyn >> 4/26 4/26 levaquin >>   Thank you for allowing pharmacy to be a part of this patient's care.  Excell Seltzer Poteet 05/18/2015 1:13 PM

## 2015-05-18 NOTE — Procedures (Signed)
   HEMODIALYSIS TREATMENT NOTE:  4 hour low-heparin dialysis completed via left forearm AVF (16g/antegrade). Goal NOT met: Unable to tolerate removal of 3 liters as ordered.  Ultrafiltration was interrupted x 45 minutes due to muscle cramping minimally relieved with NS.  Net UF 2 liters. All blood returned and hemostasis achieved within 10 minutes. Report given to Russ Halo, RN.  Rockwell Alexandria, RN, CDN

## 2015-05-18 NOTE — Consult Note (Signed)
Kirk [name] MRN: [id number] DOB/AGE: [birth year] 53 y.o. Primary Care Physician:GREEN, [name], MD Admit date: 05/17/2015 Chief Complaint:  Chief Complaint  Patient presents with  . Chest Pain   HPI: Pt is 55 year old male with past medical hx of ESRD who came to ER with c/o chest pain.  HPI dates back to few days back when pt had slow onset of dyspnea. Pt says " I had dialysis back to back but I am still short of breath" Pt  c/o cough., non productive. Pt also c/o dyspnea Pt also c/o feeling feverish. Pt also c/o chest pain In ER pt was found to have temp of 100.6 and was admitted after starting on empiric antibiotics NO c/o abdominal pain No c/o diarrhea NO c/o syncope NO c/o recent travel.  Past Medical History  Diagnosis Date  . Anxiety   . Chronic headaches   . HTN (hypertension)   . Shortness of breath   . Impotence of organic origin   . Other malaise and fatigue   . Debility, unspecified   . Cellulitis and abscess of trunk   . Kidney replaced by transplant   . Acute edema of lung, unspecified   . Unspecified constipation   . Other chronic nonalcoholic liver disease   . Localization-related (focal) (partial) epilepsy and epileptic syndromes with complex partial seizures, without mention of intractable epilepsy   . Tension headache   . Dermatophytosis of the body   . Gout, unspecified   . Pain in joint, upper arm   . Pain in joint, lower leg   . Other nonspecific abnormal serum enzyme levels   . Essential hypertension, benign   . Acute, but ill-defined, cerebrovascular disease ([place])   . Memory loss   . Hypotension, unspecified   . Hypertrophy of prostate without urinary obstruction and other lower urinary tract symptoms (LUTS)   . Renal dialysis status(V45.11) 02/05/2010    restarted 01/02/13 ofter renal trransplant failure  . Insomnia, unspecified   . Secondary hyperparathyroidism (of renal origin)   . Lumbago   . Unspecified vitamin D deficiency   .  Carpal tunnel syndrome   . Unspecified hereditary and idiopathic peripheral neuropathy   . Other and unspecified hyperlipidemia   . Unspecified essential hypertension   . Edema   . Cholelithiasis 07/13/2014  . Dysrhythmia     history of  . Asthma   . End stage renal disease on dialysis [facility])     "MWF; [facility] in [facility]" (10/21/2014)  . 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)   . OSA on CPAP   . Pneumonia "several times"       Family History  Problem Relation Age of Onset  . Adopted: Yes  . Colon cancer Neg Hx   . Esophageal cancer Neg Hx   . Rectal cancer Neg Hx   . Stomach cancer Neg Hx     Social History:  reports that he quit smoking about 8 months ago. His smoking use included Cigarettes. He has a 16 pack-year smoking history. He has never used smokeless tobacco. He reports that he does not drink alcohol or use illicit drugs.   Allergies:  Allergies  Allergen Reactions  . Codeine Nausea And Vomiting and Nausea Only    Medications Prior to Admission  Medication Sig Dispense Refill  . acetaminophen (TYLENOL) 500 MG tablet Take 1 tablet (500 mg total) by mouth every 6 (six) hours as needed for moderate pain.    Marland Kitchen amLODipine (NORVASC) 10 MG  tablet Take 1 tablet (10 mg total) by mouth daily. 90 tablet 1  . aspirin EC 81 MG tablet Take 81 mg by mouth daily.    Marland Kitchen b complex-vitamin c-folic acid (NEPHRO-VITE) 0.8 MG TABS tablet Take 1 tablet by mouth daily. 90 tablet 3  . budesonide-formoterol (SYMBICORT) 80-4.5 MCG/ACT inhaler Inhale 2 puffs into the lungs 2 (two) times daily. 1 Inhaler 1  . calcitRIOL (ROCALTROL) 0.25 MCG capsule Take 1 capsule (0.25 mcg total) by mouth every Monday, Wednesday, and Friday with hemodialysis. 30 capsule 1  . calcium acetate, Phos Binder, (PHOSLYRA) 667 MG/5ML SOLN Take 2,001 mg by mouth 3 (three) times daily with meals. And 667 mg with snack    . carvedilol (COREG) 25 MG tablet Take 1 tablet (25 mg total) by mouth 2 (two) times  daily. Take one tablet in the morning and one tablet in the evening 180 tablet 1  . Cholecalciferol (VITAMIN D3) 5000 UNITS TABS Take 1 tablet by mouth daily.    . diphenhydrAMINE (BENADRYL) 25 MG tablet Take 50 mg by mouth 2 (two) times daily.     Marland Kitchen doxazosin (CARDURA) 4 MG tablet Take 0.5 tablet  by mouth in the am and 0.5 tablet by mouth in the pm to regulate blood pressure. 90 tablet 1  . guaiFENesin-dextromethorphan (ROBITUSSIN DM) 100-10 MG/5ML syrup Take 10 mLs by mouth every 4 (four) hours as needed for cough.    Marland Kitchen omeprazole (PRILOSEC) 20 MG capsule Take 20 mg by mouth daily.    Marland Kitchen oxyCODONE-acetaminophen (PERCOCET/ROXICET) 5-325 MG tablet Take 1-2 tablets by mouth every 8 (eight) hours as needed for severe pain. 30 tablet 0  . pravastatin (PRAVACHOL) 40 MG tablet Take 1 tablet (40 mg total) by mouth every evening. 90 tablet 1  . promethazine (PHENERGAN) 25 MG tablet Take 1 tablet (25 mg total) by mouth every 8 (eight) hours as needed for nausea or vomiting. 90 tablet 1  . traMADol (ULTRAM) 50 MG tablet One up to 4 times daly if needed for pain 100 tablet 3       [id number] from the symptoms mentioned above,there are no other symptoms referable to all systems reviewed.  Marland Kitchen aspirin EC  81 mg Oral Daily  . calcitRIOL  0.25 mcg Oral Q M,W,F-HD  . calcium acetate  2,001 mg Oral TID WC  . cholecalciferol  5,000 Units Oral Daily  . heparin  5,000 Units Subcutaneous Q8H  . ipratropium-albuterol  3 mL Nebulization Q6H  . mometasone-formoterol  2 puff Inhalation BID  . multivitamin  1 tablet Oral QHS  . pantoprazole  40 mg Oral Daily  . piperacillin-tazobactam (ZOSYN) IVPB  2.25 g Intravenous Q8H  . pravastatin  40 mg Oral QPM  . vancomycin  1,500 mg Intravenous Once  . vancomycin  1,000 mg Intravenous Q M,W,F-HD        Physical Exam: Vital signs in last 24 hours: Temp:  [98.1 F (36.7 C)-100.7 F (38.2 C)] 98.3 F (36.8 C) (04/26 0500) Pulse Rate:  [86-101] 101 (04/26  0500) Resp:  [22-29] 28 (04/26 0500) BP: (104-138)/(45-80) 132/55 mmHg (04/26 0500) SpO2:  [82 %-98 %] 98 % (04/26 0736) Weight:  [223 lb (101.152 kg)-233 lb (105.688 kg)] 223 lb (101.152 kg) (04/26 0500) Weight change:  Last BM Date: 05/17/15  Intake/Output from previous day: 04/25 0701 - 04/26 0700 In: 360 [P.O.:360] Out: -  Total I/O In: 240 [P.O.:240] Out: -    Physical Exam: General- pt is awake,alert, oriented to time  place and person Resp-No REsp distress,Rhonchi+ CVS- S1S2 regular in rate and rhythm GIT- BS+, soft, NT, ND, surgical scar for transplant present EXT- NO LE Edema, NOCyanosis CNS- CN 2-12 grossly intact. Moving all 4 extremities Psych- normal mood and affect Access-AVF   Lab Results: CBC  Recent Labs  05/17/15 1300 05/18/15 0603  WBC 5.4 3.6*  HGB 12.0* 10.7*  HCT 35.6* 31.8*  PLT 99* 91*    BMET  Recent Labs  05/17/15 1300 05/18/15 0603  NA 139 140  K 4.1 3.9  CL 95* 95*  CO2 31 30  GLUCOSE 132* 141*  BUN 23* 45*  CREATININE 7.71* 11.04*  CALCIUM 9.5 9.1    MICRO No results found for this or any previous visit (from the past 240 hour([name])).    Lab Results  Component Value Date   PTH 93.1* 01/19/2010   CALCIUM 9.1 05/18/2015   CAION 1.02* 09/08/2013   PHOS 10.6* 06/25/2014    Impression: 1)Renal  ESRD on Hd               On MWF schedule                Pt was last dialyzed yesterday               will dialyze today               Hx of failed transplant  2)HTN  Medication- On Calcium Channel Blockers On Alpha and beta Blockers   3)Anemia In ESRD the goal for hgb ia 9--11    Pt hgb is at goal   4)CKD Mineral-Bone Disorder Secondary Hyperparathyroidism present  Phosphorus not at goal.   5)ID- admitted with Pneumonia Primary MD following  6)Electrolytes Normokalemic NOrmonatremic   7)Acid base Co2 at goal     Plan:  Agree with current abx regimen Will dialyze today Will use 3  [place] Will keep on  low dose epo Will recheck phos a outpt as per protocol       Kirk [name] 05/18/2015, 9:10 AM

## 2015-05-18 NOTE — Progress Notes (Signed)
Kirk [name]    Kirk [name]  [id number] DOB: 05/30/1960 DOA: 05/17/2015 PCP: Estill Dooms, MD  Outpatient Specialists:     Brief Narrative: 55 y.o. male with history of PNA, HTN and ESRD on HD MWF at [facility]. Patient presented to ED today with 3-4 day hx of myalgia, chills, sweats, nonprod cough and SOB. DOE. Several days ago he began to feel bad with malaise and myalgias w coughing. Then 2 days ago after Monday 's HD he had increased SOB and DOE.   Assessment & Plan:   Active Problems:   HTN (hypertension)   Hearing loss   History of kidney transplant   Obesity hypoventilation syndrome (HCC)   Hypoxia   Fever   Cough   Myalgia   ESRD (end stage renal disease) on dialysis (HCC)   Hypoxemia   Acute hypoxic respiratory failure, likely secondary to acute bronchitis -Mild bronchial changes noted on CT chest -Patient has noted improvement with bronchodilators -Mild wheezing auscultated on exam -We'll continue patient on scheduled bronchodilators and IV steroids -Continue to wean O2 as tolerated  Possible acute bronchitis -CT findings suggestive of bronchitis -Patient is currently on vancomycin and Zosyn -Patient afebrile with no leukocytosis -We'll transition to Levaquin, renally dosed  End-stage renal disease, dialysis dependent -Nephrology consulted -Plan to continue dialysis per schedule  Hypertension -Blood pressure currently stable -Continue to monitor  Obstructive sleep apnea -Continue CPAP as tolerated  Obesity -Seems stable  Chronic macrocytic anemia -Likely secondary to end-stage renal disease -Hemoglobin stable -No indication for transfusion at this time  DVT prophylaxis:  Heparin subcutaneous Code Status:  Full Family Communication:  Patient in room Disposition Plan:  Possible discharge home in 48-72 hours   Consultants:   Nephrology  Procedures:    Antimicrobials:    Vancomycin and Zosyn 4/25-4/26  Levaquin 4/26>>>     Subjective:  Patient reports breathing better today after breathing treatments  Objective: Filed Vitals:   05/18/15 0223 05/18/15 0500 05/18/15 0736 05/18/15 1037  BP:  132/55  141/63  Pulse:  101  94  Temp:  98.3 F (36.8 C)  98.5 F (36.9 C)  TempSrc:  Oral  Oral  Resp:  28  25  Height:      Weight:  101.152 kg (223 lb)    SpO2: 82% 98% 98% 94%    Intake/Output Summary (Last 24 hours) at 05/18/15 1253 Last data filed at 05/18/15 1000  Gross per 24 hour  Intake    720 ml  Output      0 ml  Net    720 ml   Filed Weights   05/17/15 1256 05/17/15 1833 05/18/15 0500  Weight: 105.688 kg (233 lb) 101.606 kg (224 lb) 101.152 kg (223 lb)    Examination:  General exam: Appears calm and comfortable  Respiratory system: Normal respiratory effort, end expiratory wheezing in all lung fields Cardiovascular system: S1 & S2 heard, RRR Gastrointestinal system: Abdomen is nondistended, soft and nontender. Normal bowel sounds heard. Central nervous system: Alert and oriented. No focal neurological deficits. Extremities: Symmetric 5 x 5 power. Skin: No rashes, lesions Psychiatry: Judgement and insight appear normal. Mood & affect appropriate.     Data Reviewed: I have personally reviewed following labs and imaging studies  CBC:  Recent Labs Lab 05/17/15 1300 05/18/15 0603  WBC 5.4 3.6*  HGB 12.0* 10.7*  HCT 35.6* 31.8*  MCV 101.4* 100.6*  PLT 99* 91*   Basic Metabolic Panel:  Recent Labs Lab 05/17/15 1300  05/18/15 0603  NA 139 140  K 4.1 3.9  CL 95* 95*  CO2 31 30  GLUCOSE 132* 141*  BUN 23* 45*  CREATININE 7.71* 11.04*  CALCIUM 9.5 9.1   GFR: Estimated Creatinine Clearance: 8.8 mL/min (by C-G formula based on Cr of 11.04). Liver Function Tests: No results for input(s): AST, ALT, ALKPHOS, BILITOT, PROT, ALBUMIN in the last 168 hours. No results for input(s): LIPASE, AMYLASE in the last 168 hours. No results for input(s): AMMONIA in the last 168  hours. Coagulation Profile: No results for input(s): INR, PROTIME in the last 168 hours. Cardiac Enzymes:  Recent Labs Lab 05/17/15 1300  TROPONINI <0.03   BNP (last 3 results) No results for input(s): PROBNP in the last 8760 hours. HbA1C: No results for input(s): HGBA1C in the last 72 hours. CBG: No results for input(s): GLUCAP in the last 168 hours. Lipid Profile: No results for input(s): CHOL, HDL, LDLCALC, TRIG, CHOLHDL, LDLDIRECT in the last 72 hours. Thyroid Function Tests: No results for input(s): TSH, T4TOTAL, FREET4, T3FREE, THYROIDAB in the last 72 hours. Anemia Panel: No results for input(s): VITAMINB12, FOLATE, FERRITIN, TIBC, IRON, RETICCTPCT in the last 72 hours. Urine analysis:    Component Value Date/Time   COLORURINE YELLOW 04/06/2014 1030   APPEARANCEUR CLEAR 04/06/2014 1030   LABSPEC 1.020 04/06/2014 1030   PHURINE 8.0 04/06/2014 1030   GLUCOSEU 100* 04/06/2014 1030   HGBUR MODERATE* 04/06/2014 1030   BILIRUBINUR NEGATIVE 04/06/2014 1030   KETONESUR NEGATIVE 04/06/2014 1030   PROTEINUR >300* 04/06/2014 1030   UROBILINOGEN 0.2 04/06/2014 1030   NITRITE NEGATIVE 04/06/2014 1030   LEUKOCYTESUR NEGATIVE 04/06/2014 1030   Sepsis Labs: @LABRCNTIP (procalcitonin:4,lacticidven:4)  ) Recent Results (from the past 240 hour(s))  Culture, blood (Routine X 2) w Reflex to ID Panel     Status: None (Preliminary result)   Collection Time: 05/17/15  5:32 PM  Result Value Ref Range Status   Specimen Description BLOOD RIGHT ARM  Final   Special Requests BOTTLES DRAWN AEROBIC AND ANAEROBIC 6CC  Final   Culture NO GROWTH < 24 HOURS  Final   Report Status PENDING  Incomplete  Culture, blood (Routine X 2) w Reflex to ID Panel     Status: None (Preliminary result)   Collection Time: 05/17/15  5:37 PM  Result Value Ref Range Status   Specimen Description BLOOD RIGHT HAND  Final   Special Requests BOTTLES DRAWN AEROBIC AND ANAEROBIC 6CC  Final   Culture NO GROWTH < 24  HOURS  Final   Report Status PENDING  Incomplete         Radiology Studies: Dg Chest 2 View  05/17/2015  CLINICAL DATA:  Cough and fever for 3 days.  Hemodialysis patient. EXAM: CHEST  2 VIEW COMPARISON:  08/11/2014 FINDINGS: The heart size and mediastinal contours are within normal limits. Both lungs are clear. The visualized skeletal structures are unremarkable. Lower cervical fusion hardware noted. No significant interval change. Stable exam. IMPRESSION: No active cardiopulmonary disease. Electronically Signed   By: Jerilynn Mages.  Shick M.D.   On: 05/17/2015 14:01   Ct Chest Wo Contrast  05/17/2015  CLINICAL DATA:  Fever and cough for 3 days, initial encounter EXAM: CT CHEST WITHOUT CONTRAST TECHNIQUE: Multidetector CT imaging of the chest was performed following the standard protocol without IV contrast. COMPARISON:  Chest film from earlier in the same day FINDINGS: The lungs are well aerated bilaterally. Minimal basilar atelectasis is noted. Minimal bronchial thickening is noted. The thoracic inlet is  within normal limits. The thoracic aorta and pulmonary artery as visualized are within normal limits. Coronary calcifications are seen. No hilar or mediastinal adenopathy is noted. Scanning into the upper abdomen shows atrophy of the native kidneys consistent with the patient's given clinical history of end-stage renal disease. The gallbladder has been surgically removed. The bony structures show degenerative change of the thoracic spine. IMPRESSION: No focal infiltrate is seen. Minimal bronchial thickening is noted which may represent some early changes of bronchitis. No other focal abnormality is seen. Electronically Signed   By: Inez Catalina M.D.   On: 05/17/2015 15:15        Scheduled Meds: . aspirin EC  81 mg Oral Daily  . calcitRIOL  0.25 mcg Oral Q M,W,F-HD  . calcium acetate  2,001 mg Oral TID WC  . cholecalciferol  5,000 Units Oral Daily  . epoetin (EPOGEN/PROCRIT) injection  2,000 Units  Subcutaneous Q M,W,F-HD  . heparin  5,000 Units Subcutaneous Q8H  . ipratropium-albuterol  3 mL Nebulization Q6H  . mometasone-formoterol  2 puff Inhalation BID  . multivitamin  1 tablet Oral QHS  . pantoprazole  40 mg Oral Daily  . piperacillin-tazobactam (ZOSYN) IVPB  2.25 g Intravenous Q8H  . pravastatin  40 mg Oral QPM  . vancomycin  1,500 mg Intravenous Once  . [START ON 05/20/2015] vancomycin  1,000 mg Intravenous Q M,W,F-HD        Athea Haley, Orpah Melter, MD [facility] Pager [phone] If 7PM-7AM, please contact night-coverage [email or web address] Password [facility] 05/18/2015, 12:53 PM

## 2015-05-19 DIAGNOSIS — Z940 Kidney transplant status: Secondary | ICD-10-CM

## 2015-05-19 LAB — RESPIRATORY VIRUS PANEL
ADENOVIRUS: NEGATIVE
Influenza A: NEGATIVE
Influenza B: NEGATIVE
METAPNEUMOVIRUS: NEGATIVE
PARAINFLUENZA 1 A: NEGATIVE
PARAINFLUENZA 2 A: NEGATIVE
Parainfluenza 3: NEGATIVE
RHINOVIRUS: POSITIVE — AB
Respiratory Syncytial Virus A: NEGATIVE
Respiratory Syncytial Virus B: NEGATIVE

## 2015-05-19 MED ORDER — PREDNISONE 5 MG PO TABS: 5.0000 mg | ORAL_TABLET | Freq: Every day | ORAL | Status: DC

## 2015-05-19 MED ORDER — LEVOFLOXACIN 500 MG PO TABS: 500.0000 mg | ORAL_TABLET | ORAL | Status: DC

## 2015-05-19 NOTE — Discharge Summary (Signed)
Physician Discharge Summary  Kirk [name] [id number] DOB: 1960/11/18 DOA: 05/17/2015  PCP: Estill Dooms, MD  Admit date: 05/17/2015 Discharge date: 05/19/2015  Time spent: 20 minutes  Recommendations for Outpatient Follow-up:  1. Follow up with PCP in 2-3 weeks 2. Follow up with scheduled HD as tolerated   Discharge Diagnoses:  Active Problems:   HTN (hypertension)   Hearing loss   History of kidney transplant   Obesity hypoventilation syndrome (HCC)   Hypoxia   Fever   Cough   Myalgia   ESRD (end stage renal disease) on dialysis [facility])   Hypoxemia   Discharge Condition: Improved  Diet recommendation: Heart healthy  Filed Weights   05/18/15 2253 05/19/15 0500 05/19/15 0648  Weight: 101 kg (222 lb 10.6 oz) 101 kg (222 lb 10.6 oz) 102.4 kg (225 lb 12 oz)    History of present illness:  Please review dictated H and P from 4/25 for details. Briefly, 55 y.o. male with history of PNA, HTN and ESRD on HD MWF at [place]. Patient presented to ED today with 3-4 day hx of myalgia, chills, sweats, nonprod cough and SOB. DOE. Several days ago he began to feel bad with malaise and myalgias w coughing. Then 2 days ago after Monday 's HD he had increased SOB and DOE.  Hospital Course:  Acute hypoxic respiratory failure, likely secondary to acute bronchitis -Mild bronchial changes noted on CT chest -Mild wheezing auscultated on exam initially -Patient markedly improved following bronchodilators and steroids -Plan to d/c home with prednisone taper and levaquin  Possible acute bronchitis -CT findings suggestive of bronchitis -Patient is currently on vancomycin and Zosyn -Patient afebrile with no leukocytosis -Transitioned to Levaquin, to complete on discharge  End-stage renal disease, dialysis dependent -Nephrology was consulted -Patient was continued on HD  Hypertension -Blood pressure currently stable -Continue to monitor  Obstructive sleep apnea -Continue CPAP as  tolerated  Obesity -Seems stable  Chronic macrocytic anemia -Likely secondary to end-stage renal disease -Hemoglobin remained stable  Procedures:  Hemodialysis  Consultations:  Nephrology  Discharge Exam: Filed Vitals:   05/19/15 0500 05/19/15 0648 05/19/15 0753 05/19/15 0756  BP:  128/58    Pulse:  94    Temp:  98.1 F (36.7 C)    TempSrc:  Oral    Resp:  20    Height:      Weight: 101 kg (222 lb 10.6 oz) 102.4 kg (225 lb 12 oz)    SpO2:  94% 94% 94%    General: Awake, in nad Cardiovascular: Regular, s1, s2 Respiratory: normal resp effort, no wheezing  Discharge Instructions     Medication List    TAKE these medications        acetaminophen 500 MG tablet  Commonly known as:  TYLENOL  Take 1 tablet (500 mg total) by mouth every 6 (six) hours as needed for moderate pain.     amLODipine 10 MG tablet  Commonly known as:  NORVASC  Take 1 tablet (10 mg total) by mouth daily.     aspirin EC 81 MG tablet  Take 81 mg by mouth daily.     b complex-vitamin c-folic acid 0.8 MG Tabs tablet  Take 1 tablet by mouth daily.     BENADRYL 25 MG tablet  Generic drug:  diphenhydrAMINE  Take 50 mg by mouth 2 (two) times daily.     budesonide-formoterol 80-4.5 MCG/ACT inhaler  Commonly known as:  SYMBICORT  Inhale 2 puffs into the lungs 2 (two)  times daily.     calcitRIOL 0.25 MCG capsule  Commonly known as:  ROCALTROL  Take 1 capsule (0.25 mcg total) by mouth every Monday, Wednesday, and Friday with hemodialysis.     calcium acetate (Phos Binder) 667 MG/5ML Soln  Commonly known as:  PHOSLYRA  Take 2,001 mg by mouth 3 (three) times daily with meals. And 667 mg with snack     carvedilol 25 MG tablet  Commonly known as:  COREG  Take 1 tablet (25 mg total) by mouth 2 (two) times daily. Take one tablet in the morning and one tablet in the evening     doxazosin 4 MG tablet  Commonly known as:  CARDURA  Take 0.5 tablet  by mouth in the am and 0.5 tablet by mouth in  the pm to regulate blood pressure.     guaiFENesin-dextromethorphan 100-10 MG/5ML syrup  Commonly known as:  ROBITUSSIN DM  Take 10 mLs by mouth every 4 (four) hours as needed for cough.     levofloxacin 500 MG tablet  Commonly known as:  LEVAQUIN  Take 1 tablet (500 mg total) by mouth every other day.  Start taking on:  05/20/2015     omeprazole 20 MG capsule  Commonly known as:  PRILOSEC  Take 20 mg by mouth daily.     oxyCODONE-acetaminophen 5-325 MG tablet  Commonly known as:  PERCOCET/ROXICET  Take 1-2 tablets by mouth every 8 (eight) hours as needed for severe pain.     pravastatin 40 MG tablet  Commonly known as:  PRAVACHOL  Take 1 tablet (40 mg total) by mouth every evening.     predniSONE 5 MG tablet  Commonly known as:  DELTASONE  Take 1 tablet (5 mg total) by mouth daily with breakfast.     promethazine 25 MG tablet  Commonly known as:  PHENERGAN  Take 1 tablet (25 mg total) by mouth every 8 (eight) hours as needed for nausea or vomiting.     traMADol 50 MG tablet  Commonly known as:  ULTRAM  One up to 4 times daly if needed for pain     Vitamin D3 5000 units Tabs  Take 1 tablet by mouth daily.       Allergies  Allergen Reactions  . Codeine Nausea And Vomiting and Nausea Only      The results of significant diagnostics from this hospitalization (including imaging, microbiology, ancillary and laboratory) are listed below for reference.    Significant Diagnostic Studies: Dg Chest 2 View  05/17/2015  CLINICAL DATA:  Cough and fever for 3 days.  Hemodialysis patient. EXAM: CHEST  2 VIEW COMPARISON:  08/11/2014 FINDINGS: The heart size and mediastinal contours are within normal limits. Both lungs are clear. The visualized skeletal structures are unremarkable. Lower cervical fusion hardware noted. No significant interval change. Stable exam. IMPRESSION: No active cardiopulmonary disease. Electronically Signed   By: Jerilynn Mages.  Shick M.D.   On: 05/17/2015 14:01   Ct  Chest Wo Contrast  05/17/2015  CLINICAL DATA:  Fever and cough for 3 days, initial encounter EXAM: CT CHEST WITHOUT CONTRAST TECHNIQUE: Multidetector CT imaging of the chest was performed following the standard protocol without IV contrast. COMPARISON:  Chest film from earlier in the same day FINDINGS: The lungs are well aerated bilaterally. Minimal basilar atelectasis is noted. Minimal bronchial thickening is noted. The thoracic inlet is within normal limits. The thoracic aorta and pulmonary artery as visualized are within normal limits. Coronary calcifications are seen. No hilar or  mediastinal adenopathy is noted. Scanning into the upper abdomen shows atrophy of the native kidneys consistent with the patient's given clinical history of end-stage renal disease. The gallbladder has been surgically removed. The bony structures show degenerative change of the thoracic spine. IMPRESSION: No focal infiltrate is seen. Minimal bronchial thickening is noted which may represent some early changes of bronchitis. No other focal abnormality is seen. Electronically Signed   By: Inez Catalina M.D.   On: 05/17/2015 15:15    Microbiology: Recent Results (from the past 240 hour(s))  Culture, blood (Routine X 2) w Reflex to ID Panel     Status: None (Preliminary result)   Collection Time: 05/17/15  5:32 PM  Result Value Ref Range Status   Specimen Description BLOOD RIGHT ARM  Final   Special Requests BOTTLES DRAWN AEROBIC AND ANAEROBIC 6CC  Final   Culture NO GROWTH < 24 HOURS  Final   Report Status PENDING  Incomplete  Culture, blood (Routine X 2) w Reflex to ID Panel     Status: None (Preliminary result)   Collection Time: 05/17/15  5:37 PM  Result Value Ref Range Status   Specimen Description BLOOD RIGHT HAND  Final   Special Requests BOTTLES DRAWN AEROBIC AND ANAEROBIC 6CC  Final   Culture NO GROWTH < 24 HOURS  Final   Report Status PENDING  Incomplete     Labs: Basic Metabolic Panel:  Recent Labs Lab  05/17/15 1300 05/18/15 0603  NA 139 140  K 4.1 3.9  CL 95* 95*  CO2 31 30  GLUCOSE 132* 141*  BUN 23* 45*  CREATININE 7.71* 11.04*  CALCIUM 9.5 9.1   Liver Function Tests: No results for input(s): AST, ALT, ALKPHOS, BILITOT, PROT, ALBUMIN in the last 168 hours. No results for input(s): LIPASE, AMYLASE in the last 168 hours. No results for input(s): AMMONIA in the last 168 hours. CBC:  Recent Labs Lab 05/17/15 1300 05/18/15 0603  WBC 5.4 3.6*  HGB 12.0* 10.7*  HCT 35.6* 31.8*  MCV 101.4* 100.6*  PLT 99* 91*   Cardiac Enzymes:  Recent Labs Lab 05/17/15 1300  TROPONINI <0.03   BNP: BNP (last 3 results)  Recent Labs  09/09/14 1614 09/18/14 1856 05/17/15 1300  BNP 245.0* 304.8* 143.0*    ProBNP (last 3 results) No results for input(s): PROBNP in the last 8760 hours.  CBG: No results for input(s): GLUCAP in the last 168 hours.   Signed:  Colton Tassin K  [facility] 05/19/2015, 9:45 AM

## 2015-05-19 NOTE — Care Management Important Message (Signed)
Important Message  Patient Details  Name: Kirk [name] MRN: [id number] Date of Birth: [birth year]   Medicare Important Message Given:  N/A - LOS <3 / Initial given by admissions    Sherald Barge, RN 05/19/2015, 11:28 AM

## 2015-05-19 NOTE — Consult Note (Signed)
   [facility] CM Inpatient Consult   05/19/2015  Kirk [name] 1960/11/06 [id number]  Spoke with patient at bedside regarding [facility] services. Patient does not want to participate with [facility] at this time, states he goes to dialysis 3 times a week. Patient given [facility] brochure and contact information for future reference. He voiced appreciation. Of note, [facility] Care Management services would not replace or interfere with any services that are arranged by inpatient case management or social work. For additional questions or referrals please contact:  Royetta Crochet. Laymond Purser, RN, BSN, [facility] Liaison [phone]

## 2015-05-19 NOTE — Progress Notes (Signed)
Subjective: Interval History: has complaints of some cough. He has also some sputum production but much better. He denies any difficulty in breathing. No fever chills or sweating. Objective: Vital signs in last 24 hours: Temp:  [98 F (36.7 C)-99.2 F (37.3 C)] 98.1 F (36.7 C) (04/27 0648) Pulse Rate:  [91-104] 94 (04/27 0648) Resp:  [16-25] 20 (04/27 0648) BP: (122-154)/(51-91) 128/58 mmHg (04/27 0648) SpO2:  [94 %-96 %] 94 % (04/27 0648) Weight:  [101 kg (222 lb 10.6 oz)-102.4 kg (225 lb 12 oz)] 102.4 kg (225 lb 12 oz) (04/27 0648) Weight change: -4.088 kg (-9 lb 0.2 oz)  Intake/Output from previous day: 04/26 0701 - 04/27 0700 In: 600 [P.O.:600] Out: 2000  Intake/Output this shift:    General appearance: alert, cooperative and no distress Resp: diminished breath sounds posterior - bilateral and wheezes posterior - bilateral Cardio: regular rate and rhythm, S1, S2 normal, no murmur, click, rub or gallop GI: soft, non-tender; bowel sounds normal; no masses,  no organomegaly Extremities: extremities normal, atraumatic, no cyanosis or edema  Lab Results:  Recent Labs  05/17/15 1300 05/18/15 0603  WBC 5.4 3.6*  HGB 12.0* 10.7*  HCT 35.6* 31.8*  PLT 99* 91*   BMET:  Recent Labs  05/17/15 1300 05/18/15 0603  NA 139 140  K 4.1 3.9  CL 95* 95*  CO2 31 30  GLUCOSE 132* 141*  BUN 23* 45*  CREATININE 7.71* 11.04*  CALCIUM 9.5 9.1   No results for input(s): PTH in the last 72 hours. Iron Studies: No results for input(s): IRON, TIBC, TRANSFERRIN, FERRITIN in the last 72 hours.  Studies/Results: Dg Chest 2 View  05/17/2015  CLINICAL DATA:  Cough and fever for 3 days.  Hemodialysis patient. EXAM: CHEST  2 VIEW COMPARISON:  08/11/2014 FINDINGS: The heart size and mediastinal contours are within normal limits. Both lungs are clear. The visualized skeletal structures are unremarkable. Lower cervical fusion hardware noted. No significant interval change. Stable exam.  IMPRESSION: No active cardiopulmonary disease. Electronically Signed   By: Jerilynn Mages.  Shick M.D.   On: 05/17/2015 14:01   Ct Chest Wo Contrast  05/17/2015  CLINICAL DATA:  Fever and cough for 3 days, initial encounter EXAM: CT CHEST WITHOUT CONTRAST TECHNIQUE: Multidetector CT imaging of the chest was performed following the standard protocol without IV contrast. COMPARISON:  Chest film from earlier in the same day FINDINGS: The lungs are well aerated bilaterally. Minimal basilar atelectasis is noted. Minimal bronchial thickening is noted. The thoracic inlet is within normal limits. The thoracic aorta and pulmonary artery as visualized are within normal limits. Coronary calcifications are seen. No hilar or mediastinal adenopathy is noted. Scanning into the upper abdomen shows atrophy of the native kidneys consistent with the patient's given clinical history of end-stage renal disease. The gallbladder has been surgically removed. The bony structures show degenerative change of the thoracic spine. IMPRESSION: No focal infiltrate is seen. Minimal bronchial thickening is noted which may represent some early changes of bronchitis. No other focal abnormality is seen. Electronically Signed   By: Inez Catalina M.D.   On: 05/17/2015 15:15    I have reviewed the patient's current medications.  Assessment/Plan: Problem #1 fever, cough and sputum production. Presently blood culture is negative for gross. Patient is a febrile and he is on antibiotics. Problem #2 end-stage renal disease: He is status post hemodialysis yesterday. Presently patient is asymptomatic Problem # 3 hypertension: His blood pressure is reasonably controlled Problem #4 history of obesity Problem #  5 history of sleep apnea Problem #6 anemia: His hemoglobin and hematocrit is within our target goal. Problem #7. Metabolic bone disease: His calcium is in range. Problem #8 diabetes Plan: 1] We'll make arrangements for patient to get dialysis tomorrow  just his regular schedule. 2] will dialyze him for 4 hours and we'll try to remove about 3 L if his blood pressure tolerates. 3] will check his basic metabolic panel and CBC in the morning    LOS: 1 day   Malan Werk S 05/19/2015,7:53 AM

## 2015-05-19 NOTE — Progress Notes (Signed)
Pt's IV catheter removed and intact. Pt's IV site clean dry and intact. Discharge instructions including following up appointments and medications were reviewed and discussed with patient's wife. Pt's wife verbalized understanding of discharge instructions including medications. All questions were answered and no further questions at this time. Pt in stable condition and in no acute distress at this time. Pt will be escorted by nurse tech.

## 2015-05-19 NOTE — Care Management Note (Signed)
Case Management Note  Patient Details  Name: Kirk [name] MRN: [id number] Date of Birth: 1960-10-18  Subjective/Objective:                  Pt admitted with hypoxia and pulm edema. Pt is from home with spouse. Pt receives HD MWF at [facility] in [place]. Pt uses CPAP at night. PT drives himself to appointments.   Action/Plan: Pt discharging home today with self care. No CM needs.   Expected Discharge Date:    05/19/2015              Expected Discharge Plan:  Home/Self Care  In-House Referral:  NA  Discharge planning Services  CM Consult  Post Acute Care Choice:  NA Choice offered to:  NA  DME Arranged:    DME Agency:     HH Arranged:    HH Agency:     Status of Service:  Completed, signed off  Medicare Important Message Given:    Date Medicare IM Given:    Medicare IM give by:    Date Additional Medicare IM Given:    Additional Medicare Important Message give by:     If discussed at [place] of Stay Meetings, dates discussed:    Additional Comments:  Sherald Barge, RN 05/19/2015, 11:25 AM

## 2015-05-20 DIAGNOSIS — N2581 Secondary hyperparathyroidism of renal origin: Secondary | ICD-10-CM | POA: Diagnosis not present

## 2015-05-20 DIAGNOSIS — E089 Diabetes mellitus due to underlying condition without complications: Secondary | ICD-10-CM | POA: Diagnosis not present

## 2015-05-20 DIAGNOSIS — E8779 Other fluid overload: Secondary | ICD-10-CM | POA: Diagnosis not present

## 2015-05-20 DIAGNOSIS — N186 End stage renal disease: Secondary | ICD-10-CM | POA: Diagnosis not present

## 2015-05-20 DIAGNOSIS — D509 Iron deficiency anemia, unspecified: Secondary | ICD-10-CM | POA: Diagnosis not present

## 2015-05-22 DIAGNOSIS — T8612 Kidney transplant failure: Secondary | ICD-10-CM | POA: Diagnosis not present

## 2015-05-22 DIAGNOSIS — N186 End stage renal disease: Secondary | ICD-10-CM | POA: Diagnosis not present

## 2015-05-22 DIAGNOSIS — Z992 Dependence on renal dialysis: Secondary | ICD-10-CM | POA: Diagnosis not present

## 2015-05-22 LAB — CULTURE, BLOOD (ROUTINE X 2)
Culture: NO GROWTH
Culture: NO GROWTH

## 2015-05-23 DIAGNOSIS — N186 End stage renal disease: Secondary | ICD-10-CM | POA: Diagnosis not present

## 2015-05-23 DIAGNOSIS — N2581 Secondary hyperparathyroidism of renal origin: Secondary | ICD-10-CM | POA: Diagnosis not present

## 2015-05-23 DIAGNOSIS — E089 Diabetes mellitus due to underlying condition without complications: Secondary | ICD-10-CM | POA: Diagnosis not present

## 2015-05-23 DIAGNOSIS — D631 Anemia in chronic kidney disease: Secondary | ICD-10-CM | POA: Diagnosis not present

## 2015-05-25 DIAGNOSIS — N2581 Secondary hyperparathyroidism of renal origin: Secondary | ICD-10-CM | POA: Diagnosis not present

## 2015-05-25 DIAGNOSIS — E089 Diabetes mellitus due to underlying condition without complications: Secondary | ICD-10-CM | POA: Diagnosis not present

## 2015-05-25 DIAGNOSIS — N186 End stage renal disease: Secondary | ICD-10-CM | POA: Diagnosis not present

## 2015-05-25 DIAGNOSIS — D631 Anemia in chronic kidney disease: Secondary | ICD-10-CM | POA: Diagnosis not present

## 2015-05-25 LAB — BASIC METABOLIC PANEL
BUN: 66 mg/dL — AB (ref 4–21)
BUN: 66 mg/dL — AB (ref 4–21)

## 2015-05-27 DIAGNOSIS — N186 End stage renal disease: Secondary | ICD-10-CM | POA: Diagnosis not present

## 2015-05-27 DIAGNOSIS — D631 Anemia in chronic kidney disease: Secondary | ICD-10-CM | POA: Diagnosis not present

## 2015-05-27 DIAGNOSIS — E089 Diabetes mellitus due to underlying condition without complications: Secondary | ICD-10-CM | POA: Diagnosis not present

## 2015-05-27 DIAGNOSIS — N2581 Secondary hyperparathyroidism of renal origin: Secondary | ICD-10-CM | POA: Diagnosis not present

## 2015-05-30 DIAGNOSIS — E089 Diabetes mellitus due to underlying condition without complications: Secondary | ICD-10-CM | POA: Diagnosis not present

## 2015-05-30 DIAGNOSIS — D631 Anemia in chronic kidney disease: Secondary | ICD-10-CM | POA: Diagnosis not present

## 2015-05-30 DIAGNOSIS — N2581 Secondary hyperparathyroidism of renal origin: Secondary | ICD-10-CM | POA: Diagnosis not present

## 2015-05-30 DIAGNOSIS — N186 End stage renal disease: Secondary | ICD-10-CM | POA: Diagnosis not present

## 2015-05-31 ENCOUNTER — Encounter: Payer: Self-pay | Admitting: Internal Medicine

## 2015-05-31 ENCOUNTER — Ambulatory Visit (INDEPENDENT_AMBULATORY_CARE_PROVIDER_SITE_OTHER): Payer: Medicare Other | Admitting: Internal Medicine

## 2015-05-31 VITALS — BP 170/78 | HR 79 | Temp 98.2°F | Ht 67.0 in | Wt 237.0 lb

## 2015-05-31 DIAGNOSIS — R300 Dysuria: Secondary | ICD-10-CM | POA: Diagnosis not present

## 2015-05-31 DIAGNOSIS — I10 Essential (primary) hypertension: Secondary | ICD-10-CM | POA: Diagnosis not present

## 2015-05-31 DIAGNOSIS — Z992 Dependence on renal dialysis: Secondary | ICD-10-CM

## 2015-05-31 DIAGNOSIS — N186 End stage renal disease: Secondary | ICD-10-CM | POA: Diagnosis not present

## 2015-05-31 DIAGNOSIS — E1121 Type 2 diabetes mellitus with diabetic nephropathy: Secondary | ICD-10-CM | POA: Diagnosis not present

## 2015-05-31 NOTE — Progress Notes (Signed)
Patient ID: Kirk [name], male   DOB: 1960/03/23, 55 y.o.   MRN: [id number]    Facility  [facility]    Place of Service:   OFFICE    Allergies  Allergen Reactions  . Codeine Nausea And Vomiting and Nausea Only    Chief Complaint  Patient presents with  . Hospitalization Follow-up    Acute hypoxic respiratory failure, likely secondary to acute bronchitis    HPI:  Hospitalized 05/17/15 to 05/19/15 for acute hypoxic respiratory failure due to bronchitis.  Left arm is swollen due to leakage of blood with extensive bruising from AV shunt.  Incontinent of urine for the last 3 weeks. Dysuria and increased frequency. Had antibiotic when hospitalized: levaquin  Dysuria  Essential hypertension  ESRD on dialysis ([place])  Type 2 diabetes mellitus with diabetic nephropathy, without long-term current use of insulin (HCC)    Medications: Patient's Medications  New Prescriptions   No medications on file  Previous Medications   ACETAMINOPHEN (TYLENOL) 500 MG TABLET    Take 1 tablet (500 mg total) by mouth every 6 (six) hours as needed for moderate pain.   AMLODIPINE (NORVASC) 10 MG TABLET    Take 1 tablet (10 mg total) by mouth daily.   ASPIRIN EC 81 MG TABLET    Take 81 mg by mouth daily.   B COMPLEX-VITAMIN C-FOLIC ACID (NEPHRO-VITE) 0.8 MG TABS TABLET    Take 1 tablet by mouth daily.   BUDESONIDE-FORMOTEROL (SYMBICORT) 80-4.5 MCG/ACT INHALER    Inhale 2 puffs into the lungs 2 (two) times daily.   CALCITRIOL (ROCALTROL) 0.25 MCG CAPSULE    Take 1 capsule (0.25 mcg total) by mouth every Monday, Wednesday, and Friday with hemodialysis.   CALCIUM ACETATE, PHOS BINDER, (PHOSLYRA) 667 MG/5ML SOLN    Take 2,001 mg by mouth 3 (three) times daily with meals. And 667 mg with snack   CARVEDILOL (COREG) 25 MG TABLET    Take 1 tablet (25 mg total) by mouth 2 (two) times daily. Take one tablet in the morning and one tablet in the evening   CHOLECALCIFEROL (VITAMIN D3) 5000 UNITS TABS    Take 1 tablet by  mouth daily.   DIPHENHYDRAMINE (BENADRYL) 25 MG TABLET    Take 50 mg by mouth 2 (two) times daily.    DOXAZOSIN (CARDURA) 4 MG TABLET    Take 0.5 tablet  by mouth in the am and 0.5 tablet by mouth in the pm to regulate blood pressure.   GUAIFENESIN-DEXTROMETHORPHAN (ROBITUSSIN DM) 100-10 MG/5ML SYRUP    Take 10 mLs by mouth every 4 (four) hours as needed for cough.   LEVOFLOXACIN (LEVAQUIN) 500 MG TABLET    Take 1 tablet (500 mg total) by mouth every other day.   OMEPRAZOLE (PRILOSEC) 20 MG CAPSULE    Take 20 mg by mouth daily.   OXYCODONE-ACETAMINOPHEN (PERCOCET/ROXICET) 5-325 MG TABLET    Take 1-2 tablets by mouth every 8 (eight) hours as needed for severe pain.   PRAVASTATIN (PRAVACHOL) 40 MG TABLET    Take 1 tablet (40 mg total) by mouth every evening.   PREDNISONE (DELTASONE) 5 MG TABLET    Take 1 tablet (5 mg total) by mouth daily with breakfast.   PROMETHAZINE (PHENERGAN) 25 MG TABLET    Take 1 tablet (25 mg total) by mouth every 8 (eight) hours as needed for nausea or vomiting.   TRAMADOL (ULTRAM) 50 MG TABLET    One up to 4 times daly if needed for pain  Modified Medications   No medications on file  Discontinued Medications   No medications on file    Review of Systems  Constitutional: Negative for fever.       Chronically fatigued.  HENT: Positive for ear discharge and hearing loss. Negative for ear pain and tinnitus.   Eyes: Negative.   Respiratory: Negative for shortness of breath.        Dyspnea on exertion has improved. Pneumonia. Hospitalized 04/06/14 and 06/23/2014. Rehospitalized 09/18/2014 for increasing dyspnea felt secondary to acute exacerbation of his COPD plus fluid overload.  Cardiovascular: Negative for chest pain, palpitations and leg swelling.  Gastrointestinal: Negative for abdominal pain and abdominal distention.       Cholecystectomy 10/21/2014.  Endocrine:       Diabetes is under better control.  Genitourinary: Negative.        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  Musculoskeletal:       Neck discomfort. Chronic right knee discomfort. Chronic back pains. Unsteady gait.  Skin:       Tissue fibrosis at the laparoscopic cholecystectomy incisions in the right abdomen and maple.  Neurological: Positive for dizziness.       Memory impairment is stable. Feet feel numb. Recurrent headaches  Hematological:       Anemia of CKD.  Psychiatric/Behavioral: Positive for confusion and decreased concentration.       Insomnia. Falls asleep in the day.    Filed Vitals:   05/31/15 1514  BP: 170/78  Pulse: 79  Temp: 98.2 F (36.8 C)  TempSrc: Oral  Height: '5\' 7"'$  (1.702 m)  Weight: 237 lb (107.502 kg)  SpO2: 97%   Body mass index is 37.11 kg/(m^2). Filed Weights   05/31/15 1514  Weight: 237 lb (107.502 kg)     Physical Exam  Constitutional: He is oriented to person, place, and time.  Obese.  HENT:  Loss of hearing.  Eyes:  Corrective lenses  Neck: No JVD present. No tracheal deviation present. No thyromegaly present.  Cardiovascular: Exam reveals no gallop.   No murmur heard. AF. 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  Abdominal: Soft. Bowel sounds are normal. He exhibits no distension and no mass. There is no tenderness.  Tender in the RUQ. Frequent nausea.  Musculoskeletal: Normal range of motion. He exhibits tenderness. He exhibits no edema.  Scar left knee. Tender right knee  Lymphadenopathy:    He has no cervical adenopathy.  Neurological: He is alert and oriented to person, place, and time. No cranial nerve deficit. Coordination normal.  Positive Tinel sign bilaterally  Skin: No rash noted. No erythema. No pallor.  Tissue fibrosis at cholecystectomy incisions in the right abdomen and umbilicus. Seborrheic nonhealing lesion of the left forearm. 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  Psychiatric:  Drowsy during the exam.    Labs reviewed: Lab Summary Latest Ref Rng  05/18/2015 05/17/2015 10/21/2014 10/19/2014  Hemoglobin 13.0 - 17.0 g/dL 10.7(L) 12.0(L) 8.5(L) 8.4(L)  Hematocrit 39.0 - 52.0 % 31.8(L) 35.6(L) 25.0(L) 24.9(L)  White count 4.0 - 10.5 K/uL 3.6(L) 5.4 (None) 3.3(L)  Platelet count 150 - 400 K/uL 91(L) 99(L) (None) 116(L)  Sodium 135 - 145 mmol/L 140 139 140 137  Potassium 3.5 - 5.1 mmol/L 3.9 4.1 3.7 3.9  Calcium 8.9 - 10.3 mg/dL 9.1 9.5 (None) 8.0(L)  Phosphorus - (None) (None) (None) (None)  Creatinine 0.61 - 1.24 mg/dL 11.04(H) 7.71(H) (None) 12.04(H)  AST 15 - 41 U/L (None) (  None) (None) 17  Alk Phos 38 - 126 U/L (None) (None) (None) 55  Bilirubin 0.3 - 1.2 mg/dL (None) (None) (None) 0.4  Glucose 65 - 99 mg/dL 141(H) 132(H) 127(H) 95  Cholesterol - (None) (None) (None) (None)  HDL cholesterol - (None) (None) (None) (None)  Triglycerides - (None) (None) (None) (None)  LDL Direct - (None) (None) (None) (None)  LDL Calc - (None) (None) (None) (None)  Total protein 6.5 - 8.1 g/dL (None) (None) (None) 6.6  Albumin 3.5 - 5.0 g/dL (None) (None) (None) 3.3(L)   Lab Results  Component Value Date   TSH 2.466 *Test methodology is 3rd generation TSH* 12/17/2006   TSH 2.771 *Test methodology is 3rd generation TSH* 12/13/2006   Lab Results  Component Value Date   BUN 45* 05/18/2015   BUN 23* 05/17/2015   BUN 44* 10/19/2014   Lab Results  Component Value Date   HGBA1C 6.7* 09/14/2014   HGBA1C 6.0* 04/08/2014   HGBA1C 5.7* 11/26/2013    Assessment/Plan  1. Dysuria -UA and culture  2. Essential hypertension High SBP. Check urine for infection.  3. ESRD on dialysis ([place]) Continue. Needs dental work before being put back on transplant list.  4. Type 2 diabetes mellitus with diabetic nephropathy, without long-term current use of insulin ([place]) stable

## 2015-06-01 DIAGNOSIS — E089 Diabetes mellitus due to underlying condition without complications: Secondary | ICD-10-CM | POA: Diagnosis not present

## 2015-06-01 DIAGNOSIS — N186 End stage renal disease: Secondary | ICD-10-CM | POA: Diagnosis not present

## 2015-06-01 DIAGNOSIS — D631 Anemia in chronic kidney disease: Secondary | ICD-10-CM | POA: Diagnosis not present

## 2015-06-01 DIAGNOSIS — N2581 Secondary hyperparathyroidism of renal origin: Secondary | ICD-10-CM | POA: Diagnosis not present

## 2015-06-03 DIAGNOSIS — N186 End stage renal disease: Secondary | ICD-10-CM | POA: Diagnosis not present

## 2015-06-03 DIAGNOSIS — E089 Diabetes mellitus due to underlying condition without complications: Secondary | ICD-10-CM | POA: Diagnosis not present

## 2015-06-03 DIAGNOSIS — D631 Anemia in chronic kidney disease: Secondary | ICD-10-CM | POA: Diagnosis not present

## 2015-06-03 DIAGNOSIS — N2581 Secondary hyperparathyroidism of renal origin: Secondary | ICD-10-CM | POA: Diagnosis not present

## 2015-06-06 DIAGNOSIS — N186 End stage renal disease: Secondary | ICD-10-CM | POA: Diagnosis not present

## 2015-06-06 DIAGNOSIS — N2581 Secondary hyperparathyroidism of renal origin: Secondary | ICD-10-CM | POA: Diagnosis not present

## 2015-06-06 DIAGNOSIS — D631 Anemia in chronic kidney disease: Secondary | ICD-10-CM | POA: Diagnosis not present

## 2015-06-06 DIAGNOSIS — E089 Diabetes mellitus due to underlying condition without complications: Secondary | ICD-10-CM | POA: Diagnosis not present

## 2015-06-08 DIAGNOSIS — E089 Diabetes mellitus due to underlying condition without complications: Secondary | ICD-10-CM | POA: Diagnosis not present

## 2015-06-08 DIAGNOSIS — N186 End stage renal disease: Secondary | ICD-10-CM | POA: Diagnosis not present

## 2015-06-08 DIAGNOSIS — N2581 Secondary hyperparathyroidism of renal origin: Secondary | ICD-10-CM | POA: Diagnosis not present

## 2015-06-08 DIAGNOSIS — D631 Anemia in chronic kidney disease: Secondary | ICD-10-CM | POA: Diagnosis not present

## 2015-06-08 LAB — CBC AND DIFFERENTIAL
HCT: 26 % — AB (ref 41–53)
Platelets: 97 10*3/uL — AB (ref 150–399)
WBC: 2.9 10^3/mL

## 2015-06-08 LAB — CALCIUM: Calcium: 8.8 mg/dL

## 2015-06-08 LAB — PHOSPHORUS: PHOSPHORUS: 8.1

## 2015-06-10 DIAGNOSIS — E089 Diabetes mellitus due to underlying condition without complications: Secondary | ICD-10-CM | POA: Diagnosis not present

## 2015-06-10 DIAGNOSIS — N186 End stage renal disease: Secondary | ICD-10-CM | POA: Diagnosis not present

## 2015-06-10 DIAGNOSIS — D631 Anemia in chronic kidney disease: Secondary | ICD-10-CM | POA: Diagnosis not present

## 2015-06-10 DIAGNOSIS — N2581 Secondary hyperparathyroidism of renal origin: Secondary | ICD-10-CM | POA: Diagnosis not present

## 2015-06-13 DIAGNOSIS — E089 Diabetes mellitus due to underlying condition without complications: Secondary | ICD-10-CM | POA: Diagnosis not present

## 2015-06-13 DIAGNOSIS — N186 End stage renal disease: Secondary | ICD-10-CM | POA: Diagnosis not present

## 2015-06-13 DIAGNOSIS — N2581 Secondary hyperparathyroidism of renal origin: Secondary | ICD-10-CM | POA: Diagnosis not present

## 2015-06-13 DIAGNOSIS — D631 Anemia in chronic kidney disease: Secondary | ICD-10-CM | POA: Diagnosis not present

## 2015-06-14 DIAGNOSIS — Z992 Dependence on renal dialysis: Secondary | ICD-10-CM | POA: Diagnosis not present

## 2015-06-14 DIAGNOSIS — T82858D Stenosis of vascular prosthetic devices, implants and grafts, subsequent encounter: Secondary | ICD-10-CM | POA: Diagnosis not present

## 2015-06-14 DIAGNOSIS — I871 Compression of vein: Secondary | ICD-10-CM | POA: Diagnosis not present

## 2015-06-14 DIAGNOSIS — N186 End stage renal disease: Secondary | ICD-10-CM | POA: Diagnosis not present

## 2015-06-15 DIAGNOSIS — N2581 Secondary hyperparathyroidism of renal origin: Secondary | ICD-10-CM | POA: Diagnosis not present

## 2015-06-15 DIAGNOSIS — N186 End stage renal disease: Secondary | ICD-10-CM | POA: Diagnosis not present

## 2015-06-15 DIAGNOSIS — E089 Diabetes mellitus due to underlying condition without complications: Secondary | ICD-10-CM | POA: Diagnosis not present

## 2015-06-15 DIAGNOSIS — D631 Anemia in chronic kidney disease: Secondary | ICD-10-CM | POA: Diagnosis not present

## 2015-06-17 DIAGNOSIS — D631 Anemia in chronic kidney disease: Secondary | ICD-10-CM | POA: Diagnosis not present

## 2015-06-17 DIAGNOSIS — N2581 Secondary hyperparathyroidism of renal origin: Secondary | ICD-10-CM | POA: Diagnosis not present

## 2015-06-17 DIAGNOSIS — N186 End stage renal disease: Secondary | ICD-10-CM | POA: Diagnosis not present

## 2015-06-17 DIAGNOSIS — E089 Diabetes mellitus due to underlying condition without complications: Secondary | ICD-10-CM | POA: Diagnosis not present

## 2015-06-20 DIAGNOSIS — N186 End stage renal disease: Secondary | ICD-10-CM | POA: Diagnosis not present

## 2015-06-20 DIAGNOSIS — E089 Diabetes mellitus due to underlying condition without complications: Secondary | ICD-10-CM | POA: Diagnosis not present

## 2015-06-20 DIAGNOSIS — D631 Anemia in chronic kidney disease: Secondary | ICD-10-CM | POA: Diagnosis not present

## 2015-06-20 DIAGNOSIS — N2581 Secondary hyperparathyroidism of renal origin: Secondary | ICD-10-CM | POA: Diagnosis not present

## 2015-06-22 DIAGNOSIS — Z992 Dependence on renal dialysis: Secondary | ICD-10-CM | POA: Diagnosis not present

## 2015-06-22 DIAGNOSIS — N186 End stage renal disease: Secondary | ICD-10-CM | POA: Diagnosis not present

## 2015-06-22 DIAGNOSIS — N2581 Secondary hyperparathyroidism of renal origin: Secondary | ICD-10-CM | POA: Diagnosis not present

## 2015-06-22 DIAGNOSIS — E089 Diabetes mellitus due to underlying condition without complications: Secondary | ICD-10-CM | POA: Diagnosis not present

## 2015-06-22 DIAGNOSIS — T8612 Kidney transplant failure: Secondary | ICD-10-CM | POA: Diagnosis not present

## 2015-06-22 DIAGNOSIS — D631 Anemia in chronic kidney disease: Secondary | ICD-10-CM | POA: Diagnosis not present

## 2015-06-22 LAB — CBC AND DIFFERENTIAL: HEMATOCRIT: 9 % — AB (ref 41–53)

## 2015-06-24 DIAGNOSIS — N186 End stage renal disease: Secondary | ICD-10-CM | POA: Diagnosis not present

## 2015-06-24 DIAGNOSIS — E089 Diabetes mellitus due to underlying condition without complications: Secondary | ICD-10-CM | POA: Diagnosis not present

## 2015-06-24 DIAGNOSIS — N2581 Secondary hyperparathyroidism of renal origin: Secondary | ICD-10-CM | POA: Diagnosis not present

## 2015-06-24 DIAGNOSIS — D631 Anemia in chronic kidney disease: Secondary | ICD-10-CM | POA: Diagnosis not present

## 2015-06-27 DIAGNOSIS — N2581 Secondary hyperparathyroidism of renal origin: Secondary | ICD-10-CM | POA: Diagnosis not present

## 2015-06-27 DIAGNOSIS — D631 Anemia in chronic kidney disease: Secondary | ICD-10-CM | POA: Diagnosis not present

## 2015-06-27 DIAGNOSIS — N186 End stage renal disease: Secondary | ICD-10-CM | POA: Diagnosis not present

## 2015-06-27 DIAGNOSIS — E089 Diabetes mellitus due to underlying condition without complications: Secondary | ICD-10-CM | POA: Diagnosis not present

## 2015-06-29 ENCOUNTER — Encounter: Payer: Self-pay | Admitting: Internal Medicine

## 2015-06-29 ENCOUNTER — Ambulatory Visit (INDEPENDENT_AMBULATORY_CARE_PROVIDER_SITE_OTHER): Payer: Medicare Other | Admitting: Internal Medicine

## 2015-06-29 VITALS — BP 172/62 | HR 87 | Temp 99.0°F | Ht 67.0 in | Wt 231.0 lb

## 2015-06-29 DIAGNOSIS — G4730 Sleep apnea, unspecified: Secondary | ICD-10-CM | POA: Diagnosis not present

## 2015-06-29 DIAGNOSIS — N189 Chronic kidney disease, unspecified: Secondary | ICD-10-CM | POA: Diagnosis not present

## 2015-06-29 DIAGNOSIS — N2581 Secondary hyperparathyroidism of renal origin: Secondary | ICD-10-CM | POA: Diagnosis not present

## 2015-06-29 DIAGNOSIS — G4710 Hypersomnia, unspecified: Secondary | ICD-10-CM

## 2015-06-29 DIAGNOSIS — R002 Palpitations: Secondary | ICD-10-CM | POA: Diagnosis not present

## 2015-06-29 DIAGNOSIS — R5383 Other fatigue: Secondary | ICD-10-CM | POA: Insufficient documentation

## 2015-06-29 DIAGNOSIS — E785 Hyperlipidemia, unspecified: Secondary | ICD-10-CM

## 2015-06-29 DIAGNOSIS — D631 Anemia in chronic kidney disease: Secondary | ICD-10-CM

## 2015-06-29 DIAGNOSIS — R5382 Chronic fatigue, unspecified: Secondary | ICD-10-CM

## 2015-06-29 DIAGNOSIS — Z1159 Encounter for screening for other viral diseases: Secondary | ICD-10-CM | POA: Insufficient documentation

## 2015-06-29 DIAGNOSIS — E1121 Type 2 diabetes mellitus with diabetic nephropathy: Secondary | ICD-10-CM | POA: Diagnosis not present

## 2015-06-29 DIAGNOSIS — E089 Diabetes mellitus due to underlying condition without complications: Secondary | ICD-10-CM | POA: Diagnosis not present

## 2015-06-29 DIAGNOSIS — G479 Sleep disorder, unspecified: Secondary | ICD-10-CM | POA: Insufficient documentation

## 2015-06-29 DIAGNOSIS — N186 End stage renal disease: Secondary | ICD-10-CM | POA: Diagnosis not present

## 2015-06-29 DIAGNOSIS — I10 Essential (primary) hypertension: Secondary | ICD-10-CM

## 2015-06-29 NOTE — Progress Notes (Signed)
Patient ID: Kirk [name], male   DOB: [birth year], 55 y.o.   MRN: [id number]    Facility  [facility]    Place of Service:   OFFICE    Allergies  Allergen Reactions  . Codeine Nausea And Vomiting and Nausea Only    Chief Complaint  Patient presents with  . Weakness    as soon as he sets or lays down he falls asleep.  Lab work done 04/16/15 TSH 0.8 done at [place]    HPI:  Patient has problems with grogginess and sleepy feelings all the time. He has a known sleep disorder, but worries that his thyroid might have a problem. Lab work has been done for T3 at the dialysis center, but there are no norms attached to it and I cannot tell whether it is normal or not.  Medications: Patient's Medications  New Prescriptions   No medications on file  Previous Medications   ACETAMINOPHEN (TYLENOL) 500 MG TABLET    Take 1 tablet (500 mg total) by mouth every 6 (six) hours as needed for moderate pain.   AMLODIPINE (NORVASC) 10 MG TABLET    Take 1 tablet (10 mg total) by mouth daily.   ASPIRIN EC 81 MG TABLET    Take 81 mg by mouth daily.   B COMPLEX-VITAMIN C-FOLIC ACID (NEPHRO-VITE) 0.8 MG TABS TABLET    Take 1 tablet by mouth daily.   BUDESONIDE-FORMOTEROL (SYMBICORT) 80-4.5 MCG/ACT INHALER    Inhale 2 puffs into the lungs 2 (two) times daily.   CALCITRIOL (ROCALTROL) 0.25 MCG CAPSULE    Take 1 capsule (0.25 mcg total) by mouth every Monday, Wednesday, and Friday with hemodialysis.   CALCIUM ACETATE, PHOS BINDER, (PHOSLYRA) 667 MG/5ML SOLN    Take 2,001 mg by mouth 3 (three) times daily with meals. And 667 mg with snack   CARVEDILOL (COREG) 25 MG TABLET    Take 1 tablet (25 mg total) by mouth 2 (two) times daily. Take one tablet in the morning and one tablet in the evening   CHOLECALCIFEROL (VITAMIN D3) 5000 UNITS TABS    Take 1 tablet by mouth daily.   DIPHENHYDRAMINE (BENADRYL) 25 MG TABLET    Take 50 mg by mouth 2 (two) times daily.    DOXAZOSIN (CARDURA) 4 MG TABLET    Take 0.5 tablet  by  mouth in the am and 0.5 tablet by mouth in the pm to regulate blood pressure.   GUAIFENESIN-DEXTROMETHORPHAN (ROBITUSSIN DM) 100-10 MG/5ML SYRUP    Take 10 mLs by mouth every 4 (four) hours as needed for cough.   OMEPRAZOLE (PRILOSEC) 20 MG CAPSULE    Take 20 mg by mouth daily.   OXYCODONE-ACETAMINOPHEN (PERCOCET/ROXICET) 5-325 MG TABLET    Take 1-2 tablets by mouth every 8 (eight) hours as needed for severe pain.   PRAVASTATIN (PRAVACHOL) 40 MG TABLET    Take 1 tablet (40 mg total) by mouth every evening.   PREDNISONE (DELTASONE) 5 MG TABLET    Take 1 tablet (5 mg total) by mouth daily with breakfast.   PROMETHAZINE (PHENERGAN) 25 MG TABLET    Take 1 tablet (25 mg total) by mouth every 8 (eight) hours as needed for nausea or vomiting.   TRAMADOL (ULTRAM) 50 MG TABLET    One up to 4 times daly if needed for pain  Modified Medications   No medications on file  Discontinued Medications   LEVOFLOXACIN (LEVAQUIN) 500 MG TABLET    Take 1 tablet (500 mg total)  by mouth every other day.    Review of Systems  Constitutional: Positive for fatigue. Negative for fever.       Chronically fatigued and sleepy.  HENT: Positive for ear discharge and hearing loss. Negative for ear pain and tinnitus.   Eyes: Negative.   Respiratory: Negative for shortness of breath.        Dyspnea on exertion has improved. Pneumonia. Hospitalized 04/06/14 and 06/23/2014. Rehospitalized 09/18/2014 for increasing dyspnea felt secondary to acute exacerbation of his COPD plus fluid overload.  Cardiovascular: Positive for palpitations. Negative for chest pain and leg swelling.  Gastrointestinal: Negative for abdominal pain and abdominal distention.       Cholecystectomy 10/21/2014.  Endocrine:       Diabetes is under better control.  Genitourinary: Negative.        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  Musculoskeletal:       Neck discomfort. Chronic right knee  discomfort. Chronic back pains. Unsteady gait.  Skin:       Tissue fibrosis at the laparoscopic cholecystectomy incisions in the right abdomen and maple.  Neurological: Positive for dizziness.       Memory impairment is stable. Feet feel numb. Recurrent headaches  Hematological:       Anemia of CKD.  Psychiatric/Behavioral: Positive for confusion and decreased concentration.       Insomnia. Falls asleep in the day.    Filed Vitals:   06/29/15 1519  BP: 172/62  Pulse: 87  Temp: 99 F (37.2 C)  TempSrc: Oral  Height: 5\' 7"  (1.702 m)  Weight: 231 lb (104.781 kg)  SpO2: 97%   Body mass index is 36.17 kg/(m^2). Filed Weights   06/29/15 1519  Weight: 231 lb (104.781 kg)     Physical Exam  Constitutional: He is oriented to person, place, and time.  Obese.  HENT:  Loss of hearing.  Eyes:  Corrective lenses  Neck: No JVD present. No tracheal deviation present. No thyromegaly present.  Cardiovascular: Exam reveals no gallop.   No murmur heard. AF. 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  Abdominal: Soft. Bowel sounds are normal. He exhibits no distension and no mass. There is no tenderness.  Tender in the RUQ. Frequent nausea.  Musculoskeletal: Normal range of motion. He exhibits tenderness. He exhibits no edema.  Scar left knee. Tender right knee  Lymphadenopathy:    He has no cervical adenopathy.  Neurological: He is alert and oriented to person, place, and time. No cranial nerve deficit. Coordination normal.  Positive Tinel sign bilaterally  Skin: No rash noted. No erythema. No pallor.  Tissue fibrosis at cholecystectomy incisions in the right abdomen and umbilicus. Seborrheic nonhealing lesion of the left forearm. 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  Psychiatric:  Drowsy during the exam.    Labs reviewed: Lab Summary Latest Ref Rng 05/18/2015 05/17/2015 10/21/2014 10/19/2014  Hemoglobin 13.0 - 17.0 g/dL 10.7(L) 12.0(L) 8.5(L) 8.4(L)  Hematocrit 39.0 - 52.0 % 31.8(L) 35.6(L)  25.0(L) 24.9(L)  White count 4.0 - 10.5 K/uL 3.6(L) 5.4 (None) 3.3(L)  Platelet count 150 - 400 K/uL 91(L) 99(L) (None) 116(L)  Sodium 135 - 145 mmol/L 140 139 140 137  Potassium 3.5 - 5.1 mmol/L 3.9 4.1 3.7 3.9  Calcium 8.9 - 10.3 mg/dL 9.1 9.5 (None) 8.0(L)  Phosphorus - (None) (None) (None) (None)  Creatinine 0.61 - 1.24 mg/dL 10/21/2014) 65.87(H) (None) 12.04(H)  AST 15 - 41 U/L (None) (None) (None) 17  Alk  Phos 38 - 126 U/L (None) (None) (None) 55  Bilirubin 0.3 - 1.2 mg/dL (None) (None) (None) 0.4  Glucose 65 - 99 mg/dL 141(H) 132(H) 127(H) 95  Cholesterol - (None) (None) (None) (None)  HDL cholesterol - (None) (None) (None) (None)  Triglycerides - (None) (None) (None) (None)  LDL Direct - (None) (None) (None) (None)  LDL Calc - (None) (None) (None) (None)  Total protein 6.5 - 8.1 g/dL (None) (None) (None) 6.6  Albumin 3.5 - 5.0 g/dL (None) (None) (None) 3.3(L)   Lab Results  Component Value Date   TSH 2.466 *Test methodology is 3rd generation TSH* 12/17/2006   TSH 2.771 *Test methodology is 3rd generation TSH* 12/13/2006   Lab Results  Component Value Date   BUN 45* 05/18/2015   BUN 23* 05/17/2015   BUN 44* 10/19/2014   Lab Results  Component Value Date   HGBA1C 6.7* 09/14/2014   HGBA1C 6.0* 04/08/2014   HGBA1C 5.7* 11/26/2013    Assessment/Plan  1. Palpitations - TSH; Future  2. Type 2 diabetes mellitus with diabetic nephropathy, without long-term current use of insulin (HCC) - Microalbumin Urine  3. Need for hepatitis C screening test - Hep C Antibody  4. Chronic fatigue -TSH, free T3 and Free T4  5. Sleep difficulties  6. Anemia in chronic renal disease stable  7. HLD (hyperlipidemia) Lab done today  8. Essential hypertension Mild elevation today. Usually better at home.  9. Hypersomnia with sleep apnea - T4, free - T3, free

## 2015-06-29 NOTE — Addendum Note (Signed)
Addended by: Darliss Ridgel D on: 06/29/2015 03:33 PM   Modules accepted: Orders

## 2015-06-30 ENCOUNTER — Other Ambulatory Visit: Payer: Self-pay

## 2015-06-30 LAB — LIPID PANEL
CHOL/HDL RATIO: 11.5 ratio — AB (ref 0.0–5.0)
Cholesterol, Total: 195 mg/dL (ref 100–199)
HDL: 17 mg/dL — AB (ref >39)
TRIGLYCERIDES: 1576 mg/dL — AB (ref 0–149)

## 2015-06-30 LAB — HEMOGLOBIN A1C
Est. average glucose Bld gHb Est-mCnc: 126 mg/dL
HEMOGLOBIN A1C: 6 % — AB (ref 4.8–5.6)

## 2015-06-30 LAB — T4, FREE: Free T4: 1.14 ng/dL (ref 0.82–1.77)

## 2015-06-30 LAB — CALCIUM: Calcium: 8.4 mg/dL

## 2015-06-30 LAB — T3, FREE: T3 FREE: 3.4 pg/mL (ref 2.0–4.4)

## 2015-06-30 LAB — TSH: TSH: 3.48 u[IU]/mL (ref 0.450–4.500)

## 2015-06-30 LAB — HEPATITIS C ANTIBODY: Hep C Virus Ab: <0.1 s/co ratio (ref 0.0–0.9)

## 2015-07-01 ENCOUNTER — Other Ambulatory Visit: Payer: Medicare Other

## 2015-07-01 DIAGNOSIS — E089 Diabetes mellitus due to underlying condition without complications: Secondary | ICD-10-CM | POA: Diagnosis not present

## 2015-07-01 DIAGNOSIS — N2581 Secondary hyperparathyroidism of renal origin: Secondary | ICD-10-CM | POA: Diagnosis not present

## 2015-07-01 DIAGNOSIS — D631 Anemia in chronic kidney disease: Secondary | ICD-10-CM | POA: Diagnosis not present

## 2015-07-01 DIAGNOSIS — N186 End stage renal disease: Secondary | ICD-10-CM | POA: Diagnosis not present

## 2015-07-01 DIAGNOSIS — E1121 Type 2 diabetes mellitus with diabetic nephropathy: Secondary | ICD-10-CM | POA: Diagnosis not present

## 2015-07-02 LAB — MICROALBUMIN, URINE: Microalbumin, Urine: 254.2 ug/mL

## 2015-07-04 DIAGNOSIS — N186 End stage renal disease: Secondary | ICD-10-CM | POA: Diagnosis not present

## 2015-07-04 DIAGNOSIS — E089 Diabetes mellitus due to underlying condition without complications: Secondary | ICD-10-CM | POA: Diagnosis not present

## 2015-07-04 DIAGNOSIS — N2581 Secondary hyperparathyroidism of renal origin: Secondary | ICD-10-CM | POA: Diagnosis not present

## 2015-07-04 DIAGNOSIS — D631 Anemia in chronic kidney disease: Secondary | ICD-10-CM | POA: Diagnosis not present

## 2015-07-06 DIAGNOSIS — D631 Anemia in chronic kidney disease: Secondary | ICD-10-CM | POA: Diagnosis not present

## 2015-07-06 DIAGNOSIS — N186 End stage renal disease: Secondary | ICD-10-CM | POA: Diagnosis not present

## 2015-07-06 DIAGNOSIS — E089 Diabetes mellitus due to underlying condition without complications: Secondary | ICD-10-CM | POA: Diagnosis not present

## 2015-07-06 DIAGNOSIS — N2581 Secondary hyperparathyroidism of renal origin: Secondary | ICD-10-CM | POA: Diagnosis not present

## 2015-07-08 DIAGNOSIS — D631 Anemia in chronic kidney disease: Secondary | ICD-10-CM | POA: Diagnosis not present

## 2015-07-08 DIAGNOSIS — N186 End stage renal disease: Secondary | ICD-10-CM | POA: Diagnosis not present

## 2015-07-08 DIAGNOSIS — N2581 Secondary hyperparathyroidism of renal origin: Secondary | ICD-10-CM | POA: Diagnosis not present

## 2015-07-08 DIAGNOSIS — E089 Diabetes mellitus due to underlying condition without complications: Secondary | ICD-10-CM | POA: Diagnosis not present

## 2015-07-11 DIAGNOSIS — E089 Diabetes mellitus due to underlying condition without complications: Secondary | ICD-10-CM | POA: Diagnosis not present

## 2015-07-11 DIAGNOSIS — N2581 Secondary hyperparathyroidism of renal origin: Secondary | ICD-10-CM | POA: Diagnosis not present

## 2015-07-11 DIAGNOSIS — D631 Anemia in chronic kidney disease: Secondary | ICD-10-CM | POA: Diagnosis not present

## 2015-07-11 DIAGNOSIS — N186 End stage renal disease: Secondary | ICD-10-CM | POA: Diagnosis not present

## 2015-07-13 DIAGNOSIS — N2581 Secondary hyperparathyroidism of renal origin: Secondary | ICD-10-CM | POA: Diagnosis not present

## 2015-07-13 DIAGNOSIS — E089 Diabetes mellitus due to underlying condition without complications: Secondary | ICD-10-CM | POA: Diagnosis not present

## 2015-07-13 DIAGNOSIS — N186 End stage renal disease: Secondary | ICD-10-CM | POA: Diagnosis not present

## 2015-07-13 DIAGNOSIS — D631 Anemia in chronic kidney disease: Secondary | ICD-10-CM | POA: Diagnosis not present

## 2015-07-15 DIAGNOSIS — N186 End stage renal disease: Secondary | ICD-10-CM | POA: Diagnosis not present

## 2015-07-15 DIAGNOSIS — N2581 Secondary hyperparathyroidism of renal origin: Secondary | ICD-10-CM | POA: Diagnosis not present

## 2015-07-15 DIAGNOSIS — E089 Diabetes mellitus due to underlying condition without complications: Secondary | ICD-10-CM | POA: Diagnosis not present

## 2015-07-15 DIAGNOSIS — D631 Anemia in chronic kidney disease: Secondary | ICD-10-CM | POA: Diagnosis not present

## 2015-07-18 DIAGNOSIS — N186 End stage renal disease: Secondary | ICD-10-CM | POA: Diagnosis not present

## 2015-07-18 DIAGNOSIS — E089 Diabetes mellitus due to underlying condition without complications: Secondary | ICD-10-CM | POA: Diagnosis not present

## 2015-07-18 DIAGNOSIS — D631 Anemia in chronic kidney disease: Secondary | ICD-10-CM | POA: Diagnosis not present

## 2015-07-18 DIAGNOSIS — N2581 Secondary hyperparathyroidism of renal origin: Secondary | ICD-10-CM | POA: Diagnosis not present

## 2015-07-20 DIAGNOSIS — E089 Diabetes mellitus due to underlying condition without complications: Secondary | ICD-10-CM | POA: Diagnosis not present

## 2015-07-20 DIAGNOSIS — N186 End stage renal disease: Secondary | ICD-10-CM | POA: Diagnosis not present

## 2015-07-20 DIAGNOSIS — N2581 Secondary hyperparathyroidism of renal origin: Secondary | ICD-10-CM | POA: Diagnosis not present

## 2015-07-20 DIAGNOSIS — D631 Anemia in chronic kidney disease: Secondary | ICD-10-CM | POA: Diagnosis not present

## 2015-07-22 DIAGNOSIS — N186 End stage renal disease: Secondary | ICD-10-CM | POA: Diagnosis not present

## 2015-07-22 DIAGNOSIS — D631 Anemia in chronic kidney disease: Secondary | ICD-10-CM | POA: Diagnosis not present

## 2015-07-22 DIAGNOSIS — T8612 Kidney transplant failure: Secondary | ICD-10-CM | POA: Diagnosis not present

## 2015-07-22 DIAGNOSIS — N2581 Secondary hyperparathyroidism of renal origin: Secondary | ICD-10-CM | POA: Diagnosis not present

## 2015-07-22 DIAGNOSIS — Z992 Dependence on renal dialysis: Secondary | ICD-10-CM | POA: Diagnosis not present

## 2015-07-22 DIAGNOSIS — E089 Diabetes mellitus due to underlying condition without complications: Secondary | ICD-10-CM | POA: Diagnosis not present

## 2015-07-25 DIAGNOSIS — N186 End stage renal disease: Secondary | ICD-10-CM | POA: Diagnosis not present

## 2015-07-25 DIAGNOSIS — E089 Diabetes mellitus due to underlying condition without complications: Secondary | ICD-10-CM | POA: Diagnosis not present

## 2015-07-25 DIAGNOSIS — D631 Anemia in chronic kidney disease: Secondary | ICD-10-CM | POA: Diagnosis not present

## 2015-07-25 DIAGNOSIS — N2581 Secondary hyperparathyroidism of renal origin: Secondary | ICD-10-CM | POA: Diagnosis not present

## 2015-07-27 DIAGNOSIS — N186 End stage renal disease: Secondary | ICD-10-CM | POA: Diagnosis not present

## 2015-07-27 DIAGNOSIS — N2581 Secondary hyperparathyroidism of renal origin: Secondary | ICD-10-CM | POA: Diagnosis not present

## 2015-07-27 DIAGNOSIS — E089 Diabetes mellitus due to underlying condition without complications: Secondary | ICD-10-CM | POA: Diagnosis not present

## 2015-07-27 DIAGNOSIS — D631 Anemia in chronic kidney disease: Secondary | ICD-10-CM | POA: Diagnosis not present

## 2015-07-29 DIAGNOSIS — N186 End stage renal disease: Secondary | ICD-10-CM | POA: Diagnosis not present

## 2015-07-29 DIAGNOSIS — N2581 Secondary hyperparathyroidism of renal origin: Secondary | ICD-10-CM | POA: Diagnosis not present

## 2015-07-29 DIAGNOSIS — D631 Anemia in chronic kidney disease: Secondary | ICD-10-CM | POA: Diagnosis not present

## 2015-07-29 DIAGNOSIS — E089 Diabetes mellitus due to underlying condition without complications: Secondary | ICD-10-CM | POA: Diagnosis not present

## 2015-08-01 DIAGNOSIS — N186 End stage renal disease: Secondary | ICD-10-CM | POA: Diagnosis not present

## 2015-08-01 DIAGNOSIS — N2581 Secondary hyperparathyroidism of renal origin: Secondary | ICD-10-CM | POA: Diagnosis not present

## 2015-08-01 DIAGNOSIS — E089 Diabetes mellitus due to underlying condition without complications: Secondary | ICD-10-CM | POA: Diagnosis not present

## 2015-08-01 DIAGNOSIS — D631 Anemia in chronic kidney disease: Secondary | ICD-10-CM | POA: Diagnosis not present

## 2015-08-03 DIAGNOSIS — N186 End stage renal disease: Secondary | ICD-10-CM | POA: Diagnosis not present

## 2015-08-03 DIAGNOSIS — N2581 Secondary hyperparathyroidism of renal origin: Secondary | ICD-10-CM | POA: Diagnosis not present

## 2015-08-03 DIAGNOSIS — E089 Diabetes mellitus due to underlying condition without complications: Secondary | ICD-10-CM | POA: Diagnosis not present

## 2015-08-03 DIAGNOSIS — D631 Anemia in chronic kidney disease: Secondary | ICD-10-CM | POA: Diagnosis not present

## 2015-08-05 DIAGNOSIS — N2581 Secondary hyperparathyroidism of renal origin: Secondary | ICD-10-CM | POA: Diagnosis not present

## 2015-08-05 DIAGNOSIS — D631 Anemia in chronic kidney disease: Secondary | ICD-10-CM | POA: Diagnosis not present

## 2015-08-05 DIAGNOSIS — E089 Diabetes mellitus due to underlying condition without complications: Secondary | ICD-10-CM | POA: Diagnosis not present

## 2015-08-05 DIAGNOSIS — N186 End stage renal disease: Secondary | ICD-10-CM | POA: Diagnosis not present

## 2015-08-08 DIAGNOSIS — N2581 Secondary hyperparathyroidism of renal origin: Secondary | ICD-10-CM | POA: Diagnosis not present

## 2015-08-08 DIAGNOSIS — E089 Diabetes mellitus due to underlying condition without complications: Secondary | ICD-10-CM | POA: Diagnosis not present

## 2015-08-08 DIAGNOSIS — N186 End stage renal disease: Secondary | ICD-10-CM | POA: Diagnosis not present

## 2015-08-08 DIAGNOSIS — D631 Anemia in chronic kidney disease: Secondary | ICD-10-CM | POA: Diagnosis not present

## 2015-08-10 DIAGNOSIS — E089 Diabetes mellitus due to underlying condition without complications: Secondary | ICD-10-CM | POA: Diagnosis not present

## 2015-08-10 DIAGNOSIS — N2581 Secondary hyperparathyroidism of renal origin: Secondary | ICD-10-CM | POA: Diagnosis not present

## 2015-08-10 DIAGNOSIS — D631 Anemia in chronic kidney disease: Secondary | ICD-10-CM | POA: Diagnosis not present

## 2015-08-10 DIAGNOSIS — N186 End stage renal disease: Secondary | ICD-10-CM | POA: Diagnosis not present

## 2015-08-12 DIAGNOSIS — N2581 Secondary hyperparathyroidism of renal origin: Secondary | ICD-10-CM | POA: Diagnosis not present

## 2015-08-12 DIAGNOSIS — N186 End stage renal disease: Secondary | ICD-10-CM | POA: Diagnosis not present

## 2015-08-12 DIAGNOSIS — E089 Diabetes mellitus due to underlying condition without complications: Secondary | ICD-10-CM | POA: Diagnosis not present

## 2015-08-12 DIAGNOSIS — D631 Anemia in chronic kidney disease: Secondary | ICD-10-CM | POA: Diagnosis not present

## 2015-08-15 DIAGNOSIS — N186 End stage renal disease: Secondary | ICD-10-CM | POA: Diagnosis not present

## 2015-08-15 DIAGNOSIS — E089 Diabetes mellitus due to underlying condition without complications: Secondary | ICD-10-CM | POA: Diagnosis not present

## 2015-08-15 DIAGNOSIS — D631 Anemia in chronic kidney disease: Secondary | ICD-10-CM | POA: Diagnosis not present

## 2015-08-15 DIAGNOSIS — N2581 Secondary hyperparathyroidism of renal origin: Secondary | ICD-10-CM | POA: Diagnosis not present

## 2015-08-16 DIAGNOSIS — I871 Compression of vein: Secondary | ICD-10-CM | POA: Diagnosis not present

## 2015-08-16 DIAGNOSIS — Z992 Dependence on renal dialysis: Secondary | ICD-10-CM | POA: Diagnosis not present

## 2015-08-16 DIAGNOSIS — T82858D Stenosis of vascular prosthetic devices, implants and grafts, subsequent encounter: Secondary | ICD-10-CM | POA: Diagnosis not present

## 2015-08-16 DIAGNOSIS — N186 End stage renal disease: Secondary | ICD-10-CM | POA: Diagnosis not present

## 2015-08-17 DIAGNOSIS — N186 End stage renal disease: Secondary | ICD-10-CM | POA: Diagnosis not present

## 2015-08-17 DIAGNOSIS — N2581 Secondary hyperparathyroidism of renal origin: Secondary | ICD-10-CM | POA: Diagnosis not present

## 2015-08-17 DIAGNOSIS — D631 Anemia in chronic kidney disease: Secondary | ICD-10-CM | POA: Diagnosis not present

## 2015-08-17 DIAGNOSIS — E089 Diabetes mellitus due to underlying condition without complications: Secondary | ICD-10-CM | POA: Diagnosis not present

## 2015-08-19 DIAGNOSIS — N2581 Secondary hyperparathyroidism of renal origin: Secondary | ICD-10-CM | POA: Diagnosis not present

## 2015-08-19 DIAGNOSIS — E089 Diabetes mellitus due to underlying condition without complications: Secondary | ICD-10-CM | POA: Diagnosis not present

## 2015-08-19 DIAGNOSIS — D631 Anemia in chronic kidney disease: Secondary | ICD-10-CM | POA: Diagnosis not present

## 2015-08-19 DIAGNOSIS — N186 End stage renal disease: Secondary | ICD-10-CM | POA: Diagnosis not present

## 2015-08-22 DIAGNOSIS — N186 End stage renal disease: Secondary | ICD-10-CM | POA: Diagnosis not present

## 2015-08-22 DIAGNOSIS — D631 Anemia in chronic kidney disease: Secondary | ICD-10-CM | POA: Diagnosis not present

## 2015-08-22 DIAGNOSIS — Z992 Dependence on renal dialysis: Secondary | ICD-10-CM | POA: Diagnosis not present

## 2015-08-22 DIAGNOSIS — E089 Diabetes mellitus due to underlying condition without complications: Secondary | ICD-10-CM | POA: Diagnosis not present

## 2015-08-22 DIAGNOSIS — N2581 Secondary hyperparathyroidism of renal origin: Secondary | ICD-10-CM | POA: Diagnosis not present

## 2015-08-22 DIAGNOSIS — T8612 Kidney transplant failure: Secondary | ICD-10-CM | POA: Diagnosis not present

## 2015-08-24 DIAGNOSIS — E089 Diabetes mellitus due to underlying condition without complications: Secondary | ICD-10-CM | POA: Diagnosis not present

## 2015-08-24 DIAGNOSIS — D631 Anemia in chronic kidney disease: Secondary | ICD-10-CM | POA: Diagnosis not present

## 2015-08-24 DIAGNOSIS — N186 End stage renal disease: Secondary | ICD-10-CM | POA: Diagnosis not present

## 2015-08-24 DIAGNOSIS — N2581 Secondary hyperparathyroidism of renal origin: Secondary | ICD-10-CM | POA: Diagnosis not present

## 2015-08-26 DIAGNOSIS — E089 Diabetes mellitus due to underlying condition without complications: Secondary | ICD-10-CM | POA: Diagnosis not present

## 2015-08-26 DIAGNOSIS — D631 Anemia in chronic kidney disease: Secondary | ICD-10-CM | POA: Diagnosis not present

## 2015-08-26 DIAGNOSIS — N186 End stage renal disease: Secondary | ICD-10-CM | POA: Diagnosis not present

## 2015-08-26 DIAGNOSIS — N2581 Secondary hyperparathyroidism of renal origin: Secondary | ICD-10-CM | POA: Diagnosis not present

## 2015-08-29 DIAGNOSIS — N186 End stage renal disease: Secondary | ICD-10-CM | POA: Diagnosis not present

## 2015-08-29 DIAGNOSIS — D631 Anemia in chronic kidney disease: Secondary | ICD-10-CM | POA: Diagnosis not present

## 2015-08-29 DIAGNOSIS — N2581 Secondary hyperparathyroidism of renal origin: Secondary | ICD-10-CM | POA: Diagnosis not present

## 2015-08-29 DIAGNOSIS — E089 Diabetes mellitus due to underlying condition without complications: Secondary | ICD-10-CM | POA: Diagnosis not present

## 2015-08-31 DIAGNOSIS — N186 End stage renal disease: Secondary | ICD-10-CM | POA: Diagnosis not present

## 2015-08-31 DIAGNOSIS — D631 Anemia in chronic kidney disease: Secondary | ICD-10-CM | POA: Diagnosis not present

## 2015-08-31 DIAGNOSIS — N2581 Secondary hyperparathyroidism of renal origin: Secondary | ICD-10-CM | POA: Diagnosis not present

## 2015-08-31 DIAGNOSIS — E089 Diabetes mellitus due to underlying condition without complications: Secondary | ICD-10-CM | POA: Diagnosis not present

## 2015-09-02 DIAGNOSIS — E089 Diabetes mellitus due to underlying condition without complications: Secondary | ICD-10-CM | POA: Diagnosis not present

## 2015-09-02 DIAGNOSIS — N2581 Secondary hyperparathyroidism of renal origin: Secondary | ICD-10-CM | POA: Diagnosis not present

## 2015-09-02 DIAGNOSIS — D631 Anemia in chronic kidney disease: Secondary | ICD-10-CM | POA: Diagnosis not present

## 2015-09-02 DIAGNOSIS — N186 End stage renal disease: Secondary | ICD-10-CM | POA: Diagnosis not present

## 2015-09-05 DIAGNOSIS — N186 End stage renal disease: Secondary | ICD-10-CM | POA: Diagnosis not present

## 2015-09-05 DIAGNOSIS — N2581 Secondary hyperparathyroidism of renal origin: Secondary | ICD-10-CM | POA: Diagnosis not present

## 2015-09-05 DIAGNOSIS — D631 Anemia in chronic kidney disease: Secondary | ICD-10-CM | POA: Diagnosis not present

## 2015-09-05 DIAGNOSIS — E089 Diabetes mellitus due to underlying condition without complications: Secondary | ICD-10-CM | POA: Diagnosis not present

## 2015-09-07 ENCOUNTER — Ambulatory Visit: Payer: Self-pay | Admitting: Internal Medicine

## 2015-09-07 DIAGNOSIS — N2581 Secondary hyperparathyroidism of renal origin: Secondary | ICD-10-CM | POA: Diagnosis not present

## 2015-09-07 DIAGNOSIS — E089 Diabetes mellitus due to underlying condition without complications: Secondary | ICD-10-CM | POA: Diagnosis not present

## 2015-09-07 DIAGNOSIS — D631 Anemia in chronic kidney disease: Secondary | ICD-10-CM | POA: Diagnosis not present

## 2015-09-07 DIAGNOSIS — N186 End stage renal disease: Secondary | ICD-10-CM | POA: Diagnosis not present

## 2015-09-09 DIAGNOSIS — N2581 Secondary hyperparathyroidism of renal origin: Secondary | ICD-10-CM | POA: Diagnosis not present

## 2015-09-09 DIAGNOSIS — D631 Anemia in chronic kidney disease: Secondary | ICD-10-CM | POA: Diagnosis not present

## 2015-09-09 DIAGNOSIS — N186 End stage renal disease: Secondary | ICD-10-CM | POA: Diagnosis not present

## 2015-09-09 DIAGNOSIS — E089 Diabetes mellitus due to underlying condition without complications: Secondary | ICD-10-CM | POA: Diagnosis not present

## 2015-09-12 DIAGNOSIS — N2581 Secondary hyperparathyroidism of renal origin: Secondary | ICD-10-CM | POA: Diagnosis not present

## 2015-09-12 DIAGNOSIS — D631 Anemia in chronic kidney disease: Secondary | ICD-10-CM | POA: Diagnosis not present

## 2015-09-12 DIAGNOSIS — N186 End stage renal disease: Secondary | ICD-10-CM | POA: Diagnosis not present

## 2015-09-12 DIAGNOSIS — E089 Diabetes mellitus due to underlying condition without complications: Secondary | ICD-10-CM | POA: Diagnosis not present

## 2015-09-14 DIAGNOSIS — N186 End stage renal disease: Secondary | ICD-10-CM | POA: Diagnosis not present

## 2015-09-14 DIAGNOSIS — D631 Anemia in chronic kidney disease: Secondary | ICD-10-CM | POA: Diagnosis not present

## 2015-09-14 DIAGNOSIS — N2581 Secondary hyperparathyroidism of renal origin: Secondary | ICD-10-CM | POA: Diagnosis not present

## 2015-09-14 DIAGNOSIS — E089 Diabetes mellitus due to underlying condition without complications: Secondary | ICD-10-CM | POA: Diagnosis not present

## 2015-09-16 DIAGNOSIS — N2581 Secondary hyperparathyroidism of renal origin: Secondary | ICD-10-CM | POA: Diagnosis not present

## 2015-09-16 DIAGNOSIS — N186 End stage renal disease: Secondary | ICD-10-CM | POA: Diagnosis not present

## 2015-09-16 DIAGNOSIS — E089 Diabetes mellitus due to underlying condition without complications: Secondary | ICD-10-CM | POA: Diagnosis not present

## 2015-09-16 DIAGNOSIS — D631 Anemia in chronic kidney disease: Secondary | ICD-10-CM | POA: Diagnosis not present

## 2015-09-19 DIAGNOSIS — E089 Diabetes mellitus due to underlying condition without complications: Secondary | ICD-10-CM | POA: Diagnosis not present

## 2015-09-19 DIAGNOSIS — N2581 Secondary hyperparathyroidism of renal origin: Secondary | ICD-10-CM | POA: Diagnosis not present

## 2015-09-19 DIAGNOSIS — N186 End stage renal disease: Secondary | ICD-10-CM | POA: Diagnosis not present

## 2015-09-19 DIAGNOSIS — D631 Anemia in chronic kidney disease: Secondary | ICD-10-CM | POA: Diagnosis not present

## 2015-09-21 DIAGNOSIS — D631 Anemia in chronic kidney disease: Secondary | ICD-10-CM | POA: Diagnosis not present

## 2015-09-21 DIAGNOSIS — N186 End stage renal disease: Secondary | ICD-10-CM | POA: Diagnosis not present

## 2015-09-21 DIAGNOSIS — E089 Diabetes mellitus due to underlying condition without complications: Secondary | ICD-10-CM | POA: Diagnosis not present

## 2015-09-21 DIAGNOSIS — N2581 Secondary hyperparathyroidism of renal origin: Secondary | ICD-10-CM | POA: Diagnosis not present

## 2015-09-22 DIAGNOSIS — N186 End stage renal disease: Secondary | ICD-10-CM | POA: Diagnosis not present

## 2015-09-22 DIAGNOSIS — T8612 Kidney transplant failure: Secondary | ICD-10-CM | POA: Diagnosis not present

## 2015-09-22 DIAGNOSIS — Z992 Dependence on renal dialysis: Secondary | ICD-10-CM | POA: Diagnosis not present

## 2015-09-23 DIAGNOSIS — Z23 Encounter for immunization: Secondary | ICD-10-CM | POA: Diagnosis not present

## 2015-09-23 DIAGNOSIS — N2581 Secondary hyperparathyroidism of renal origin: Secondary | ICD-10-CM | POA: Diagnosis not present

## 2015-09-23 DIAGNOSIS — E089 Diabetes mellitus due to underlying condition without complications: Secondary | ICD-10-CM | POA: Diagnosis not present

## 2015-09-23 DIAGNOSIS — N186 End stage renal disease: Secondary | ICD-10-CM | POA: Diagnosis not present

## 2015-09-23 DIAGNOSIS — D631 Anemia in chronic kidney disease: Secondary | ICD-10-CM | POA: Diagnosis not present

## 2015-09-26 DIAGNOSIS — D631 Anemia in chronic kidney disease: Secondary | ICD-10-CM | POA: Diagnosis not present

## 2015-09-26 DIAGNOSIS — E089 Diabetes mellitus due to underlying condition without complications: Secondary | ICD-10-CM | POA: Diagnosis not present

## 2015-09-26 DIAGNOSIS — N2581 Secondary hyperparathyroidism of renal origin: Secondary | ICD-10-CM | POA: Diagnosis not present

## 2015-09-26 DIAGNOSIS — N186 End stage renal disease: Secondary | ICD-10-CM | POA: Diagnosis not present

## 2015-09-26 DIAGNOSIS — Z23 Encounter for immunization: Secondary | ICD-10-CM | POA: Diagnosis not present

## 2015-09-28 DIAGNOSIS — D631 Anemia in chronic kidney disease: Secondary | ICD-10-CM | POA: Diagnosis not present

## 2015-09-28 DIAGNOSIS — E089 Diabetes mellitus due to underlying condition without complications: Secondary | ICD-10-CM | POA: Diagnosis not present

## 2015-09-28 DIAGNOSIS — N2581 Secondary hyperparathyroidism of renal origin: Secondary | ICD-10-CM | POA: Diagnosis not present

## 2015-09-28 DIAGNOSIS — N186 End stage renal disease: Secondary | ICD-10-CM | POA: Diagnosis not present

## 2015-09-28 DIAGNOSIS — Z23 Encounter for immunization: Secondary | ICD-10-CM | POA: Diagnosis not present

## 2015-09-30 DIAGNOSIS — N186 End stage renal disease: Secondary | ICD-10-CM | POA: Diagnosis not present

## 2015-09-30 DIAGNOSIS — Z23 Encounter for immunization: Secondary | ICD-10-CM | POA: Diagnosis not present

## 2015-09-30 DIAGNOSIS — N2581 Secondary hyperparathyroidism of renal origin: Secondary | ICD-10-CM | POA: Diagnosis not present

## 2015-09-30 DIAGNOSIS — D631 Anemia in chronic kidney disease: Secondary | ICD-10-CM | POA: Diagnosis not present

## 2015-09-30 DIAGNOSIS — E089 Diabetes mellitus due to underlying condition without complications: Secondary | ICD-10-CM | POA: Diagnosis not present

## 2015-10-03 DIAGNOSIS — E089 Diabetes mellitus due to underlying condition without complications: Secondary | ICD-10-CM | POA: Diagnosis not present

## 2015-10-03 DIAGNOSIS — Z23 Encounter for immunization: Secondary | ICD-10-CM | POA: Diagnosis not present

## 2015-10-03 DIAGNOSIS — N2581 Secondary hyperparathyroidism of renal origin: Secondary | ICD-10-CM | POA: Diagnosis not present

## 2015-10-03 DIAGNOSIS — N186 End stage renal disease: Secondary | ICD-10-CM | POA: Diagnosis not present

## 2015-10-03 DIAGNOSIS — D631 Anemia in chronic kidney disease: Secondary | ICD-10-CM | POA: Diagnosis not present

## 2015-10-04 ENCOUNTER — Ambulatory Visit (INDEPENDENT_AMBULATORY_CARE_PROVIDER_SITE_OTHER): Payer: Medicare Other | Admitting: Physician Assistant

## 2015-10-04 ENCOUNTER — Other Ambulatory Visit (INDEPENDENT_AMBULATORY_CARE_PROVIDER_SITE_OTHER): Payer: Medicare Other

## 2015-10-04 ENCOUNTER — Encounter: Payer: Self-pay | Admitting: Physician Assistant

## 2015-10-04 VITALS — BP 140/70 | HR 90 | Ht 67.0 in | Wt 234.0 lb

## 2015-10-04 DIAGNOSIS — R5383 Other fatigue: Secondary | ICD-10-CM | POA: Diagnosis not present

## 2015-10-04 DIAGNOSIS — R197 Diarrhea, unspecified: Secondary | ICD-10-CM

## 2015-10-04 DIAGNOSIS — K219 Gastro-esophageal reflux disease without esophagitis: Secondary | ICD-10-CM | POA: Diagnosis not present

## 2015-10-04 LAB — CBC WITH DIFFERENTIAL/PLATELET
BASOS PCT: 0.6 % (ref 0.0–3.0)
Basophils Absolute: 0 10*3/uL (ref 0.0–0.1)
EOS PCT: 2.2 % (ref 0.0–5.0)
Eosinophils Absolute: 0.1 10*3/uL (ref 0.0–0.7)
HCT: 35.8 % — ABNORMAL LOW (ref 39.0–52.0)
Hemoglobin: 12.4 g/dL — ABNORMAL LOW (ref 13.0–17.0)
LYMPHS ABS: 0.7 10*3/uL (ref 0.7–4.0)
Lymphocytes Relative: 15.2 % (ref 12.0–46.0)
MCHC: 34.7 g/dL (ref 30.0–36.0)
MCV: 96.4 fl (ref 78.0–100.0)
MONOS PCT: 9.5 % (ref 3.0–12.0)
Monocytes Absolute: 0.5 10*3/uL (ref 0.1–1.0)
NEUTROS ABS: 3.6 10*3/uL (ref 1.4–7.7)
NEUTROS PCT: 72.5 % (ref 43.0–77.0)
PLATELETS: 119 10*3/uL — AB (ref 150.0–400.0)
RBC: 3.71 Mil/uL — ABNORMAL LOW (ref 4.22–5.81)
RDW: 16 % — ABNORMAL HIGH (ref 11.5–15.5)
WBC: 4.9 10*3/uL (ref 4.0–10.5)

## 2015-10-04 LAB — COMPREHENSIVE METABOLIC PANEL
ALK PHOS: 64 U/L (ref 39–117)
ALT: 21 U/L (ref 0–53)
AST: 19 U/L (ref 0–37)
Albumin: 4.2 g/dL (ref 3.5–5.2)
BUN: 43 mg/dL — ABNORMAL HIGH (ref 6–23)
CO2: 35 meq/L — AB (ref 19–32)
Calcium: 11.4 mg/dL — ABNORMAL HIGH (ref 8.4–10.5)
Chloride: 95 mEq/L — ABNORMAL LOW (ref 96–112)
Creatinine, Ser: 10.26 mg/dL (ref 0.40–1.50)
GFR: 5.64 mL/min — AB (ref >60.00)
GLUCOSE: 196 mg/dL — AB (ref 70–99)
POTASSIUM: 4.3 meq/L (ref 3.5–5.1)
Sodium: 138 mEq/L (ref 135–145)
TOTAL PROTEIN: 7.1 g/dL (ref 6.0–8.3)
Total Bilirubin: 0.4 mg/dL (ref 0.2–1.2)

## 2015-10-04 MED ORDER — OMEPRAZOLE 20 MG PO CPDR: 20.0000 mg | DELAYED_RELEASE_CAPSULE | Freq: Two times a day (BID) | ORAL | 1 refills | Status: DC

## 2015-10-04 NOTE — Patient Instructions (Addendum)
Please go to the basement level to have your labs drawn and stool studies. We have given you samples of a probiotic Align and a coupon. Take 1 capsule daily.  We made you an appointment with Dr. Owens Loffler for 12-14-2015 at 9:15 am.

## 2015-10-04 NOTE — Progress Notes (Signed)
Chief Complaint: Diarrhea, GERD  HPI:  Kirk [name] is a 55 year old male, with past medical history significant for end-stage renal disease on hemodialysis Monday Wednesday Friday, anemia and chronic kidney disease, anxiety and hypertension, who was referred to me by Estill Dooms, MD for a complaint of diarrhea and reflux. Patient typically follows with Dr. Ardis Hughs. Per chart review patient was last seen in clinic on 03/05/14 by Dr. Ardis Hughs  and at that time complained of some bloody diarrhea. GI pathogen panel, fecal leuks negative at that time. Rectal was performed and patient was instructed to use Preparation H for intermittent anal discomfort and rectal bleeding.  Independent review of report and images from last EGD completed on 06/27/11 revealed moderate gastritis and duodenitis with biopsies positive for H. pylori, colonoscopy on that day was positive for multiple polyps and recommendations for repeat in 5 years as these were tubular adenomas.  Today, the patient tells me that he started with a "orangey/yellow" watery diarrhea 2-4 times per day urgent in nature and typically after eating about a week ago. Today is the first time that he has had a solid "normal brown" stool. The patient notes that over this time he has remained somewhat bloated with a generalized abdominal pain, he describes his stomach as feeling "swollen and tight". Patient has a continued to notice some streaking blood on the toilet paper, but believes this is from his hemorrhoids as it was previously. Patient denies any change in diet, new medicines or sick contacts. His wife is with him and explains that this has happened before and he was started on a probiotic which seemed to help.  Patient also describes reflux symptoms, more severe in the middle the night, in fact this has awoken him from his sleep on 2-3 occasions where he vomited an "acidic material" into his mouth. The patient has not been taking his omeprazole 20 mg  daily recently as he ran out and was unsure if he could continue this. He has been on this medication for over a year. He has been using Tums when necessary with no relief.  Patient also tells me today that he has chronic fatigue, he recently saw his primary care provider regarding this with a normal TSH, this is being followed by them.  Patient denies fever, chills, melena, weight loss, anorexia or nausea.   Past Medical History:  Diagnosis Date  . Acute edema of lung, unspecified   . Acute, but ill-defined, cerebrovascular disease ([place])   . 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)   . Anxiety   . Asthma   . Carpal tunnel syndrome   . Cellulitis and abscess of trunk   . Cholelithiasis 07/13/2014  . Chronic headaches   . Debility, unspecified   . Dermatophytosis of the body   . Dysrhythmia    history of  . Edema   . End stage renal disease on dialysis [facility])    "MWF; [facility]" (10/21/2014)  . Essential hypertension, benign   . Gout, unspecified   . HTN (hypertension)   . Hypertrophy of prostate without urinary obstruction and other lower urinary tract symptoms (LUTS)   . Hypotension, unspecified   . Impotence of organic origin   . Insomnia, unspecified   . Kidney replaced by transplant   . Localization-related (focal) (partial) epilepsy and epileptic syndromes with complex partial seizures, without mention of intractable epilepsy   . Lumbago   . Memory loss   . OSA on CPAP   .  Other and unspecified hyperlipidemia   . Other chronic nonalcoholic liver disease   . Other malaise and fatigue   . Other nonspecific abnormal serum enzyme levels   . Pain in joint, lower leg   . Pain in joint, upper arm   . Pneumonia "several times"  . Renal dialysis status(V45.11) 02/05/2010   restarted 01/02/13 ofter renal trransplant failure  . Secondary hyperparathyroidism (of renal origin)   . Shortness of breath   . Tension headache   . Unspecified constipation   .  Unspecified essential hypertension   . Unspecified hereditary and idiopathic peripheral neuropathy   . Unspecified vitamin D deficiency     Past Surgical History:  Procedure Laterality Date  . AV FISTULA PLACEMENT Left ?2010   "forearm; at [facility]"  . BACK SURGERY    . CARDIAC CATHETERIZATION  03/21/2011  . CHOLECYSTECTOMY N/A 10/21/2014   Procedure: LAPAROSCOPIC CHOLECYSTECTOMY WITH INTRAOPERATIVE CHOLANGIOGRAM;  Surgeon: Autumn Messing III, MD;  Location: [facility];  Service: General;  Laterality: N/A;  . COLONOSCOPY    . 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 KIDNEY TRANSPLANT  08/17/2011   [place]  10/21/2014   w/IOC  . LEFT HEART CATHETERIZATION WITH CORONARY ANGIOGRAM N/A 03/21/2011   Procedure: LEFT HEART CATHETERIZATION WITH CORONARY ANGIOGRAM;  Surgeon: [name], MD;  Location: [facility] CATH LAB;  Service: Cardiovascular;  Laterality: N/A;  . NEPHRECTOMY  08/2013   removed transplaned kidney  . POSTERIOR FUSION CERVICAL SPINE  06/25/2012   for spinal stenosis  . VASECTOMY  2010    Current Outpatient Prescriptions  Medication Sig Dispense Refill  . acetaminophen (TYLENOL) 500 MG tablet Take 1 tablet (500 mg total) by mouth every 6 (six) hours as needed for moderate pain.    Marland Kitchen amLODipine (NORVASC) 10 MG tablet Take 1 tablet (10 mg total) by mouth daily. 90 tablet 1  . aspirin EC 81 MG tablet Take 81 mg by mouth daily.    Marland Kitchen b complex-vitamin c-folic acid (NEPHRO-VITE) 0.8 MG TABS tablet Take 1 tablet by mouth daily. 90 tablet 3  . budesonide-formoterol (SYMBICORT) 80-4.5 MCG/ACT inhaler Inhale 2 puffs into the lungs 2 (two) times daily. (Patient taking differently: Inhale 2 puffs into the lungs 2 (two) times daily as needed. ) 1 Inhaler 1  . calcitRIOL (ROCALTROL) 0.25 MCG capsule Take 1 capsule (0.25 mcg total) by mouth every Monday, Wednesday, and Friday with hemodialysis. 30 capsule 1  . calcium acetate, Phos Binder,  (PHOSLYRA) 667 MG/5ML SOLN Take 2,001 mg by mouth 3 (three) times daily with meals. And 667 mg with snack    . carvedilol (COREG) 25 MG tablet Take 1 tablet (25 mg total) by mouth 2 (two) times daily. Take one tablet in the morning and one tablet in the evening 180 tablet 1  . Cholecalciferol (VITAMIN D3) 5000 UNITS TABS Take 1 tablet by mouth daily.    . diphenhydrAMINE (BENADRYL) 25 MG tablet Take 50 mg by mouth 2 (two) times daily.     Marland Kitchen doxazosin (CARDURA) 4 MG tablet Take 0.5 tablet  by mouth in the am and 0.5 tablet by mouth in the pm to regulate blood pressure. 90 tablet 1  . guaiFENesin-dextromethorphan (ROBITUSSIN DM) 100-10 MG/5ML syrup Take 10 mLs by mouth every 4 (four) hours as needed for cough.    Marland Kitchen omeprazole (PRILOSEC) 20 MG capsule Take 1 capsule (20 mg total) by mouth 2 (two) times daily before  a meal. 60 capsule 1  . oxyCODONE-acetaminophen (PERCOCET/ROXICET) 5-325 MG tablet Take 1-2 tablets by mouth every 8 (eight) hours as needed for severe pain. 30 tablet 0  . pravastatin (PRAVACHOL) 40 MG tablet Take 1 tablet (40 mg total) by mouth every evening. 90 tablet 1  . promethazine (PHENERGAN) 25 MG tablet Take 1 tablet (25 mg total) by mouth every 8 (eight) hours as needed for nausea or vomiting. 90 tablet 1  . traMADol (ULTRAM) 50 MG tablet One up to 4 times daly if needed for pain 100 tablet 3   No current facility-administered medications for this visit.     Allergies as of 10/04/2015 - Review Complete 10/04/2015  Allergen Reaction Noted  . Codeine Nausea And Vomiting and Nausea Only 05/20/2007    Family History  Problem Relation Age of Onset  . Adopted: Yes  . Colon cancer Neg Hx   . Esophageal cancer Neg Hx   . Rectal cancer Neg Hx   . Stomach cancer Neg Hx     Social History   Social History  . Marital status: Married    Spouse name: [name]  . Number of children: 3  . Years of education: N/A   Occupational History  . disabled Disabled   Social History Main  Topics  . Smoking status: Former Smoker    Packs/day: 0.50    Years: 32.00    Types: Cigarettes    Quit date: 08/23/2014  . Smokeless tobacco: Never Used  . Alcohol use No  . Drug use: No  . Sexual activity: Yes   Other Topics Concern  . Not on file   Social History Narrative   Drinks 1 cup of caffeine daily.    Review of Systems:     Constitutional: No weight loss, fever, chills, weakness or fatigue HEENT: Eyes: No change in vision               Ears, Nose, Throat:  No change in hearing or congestion Skin: No rash or itching Cardiovascular: No chest pain, chest pressure or palpitations   Respiratory: No SOB or cough Gastrointestinal: See HPI and otherwise negative Genitourinary: No dysuria or change in urinary frequency Neurological: No headache, dizziness or syncope Musculoskeletal: No new muscle or joint pain Hematologic: Positive for some rectal bleeding No bruising Psychiatric: Positive history of anxiety No history of depression    Physical Exam:  Vital signs: BP 140/70 (BP Location: Right Arm, Patient Position: Sitting, Cuff Size: Normal)   Pulse 90   Ht 5\' 7"  (1.702 m)   Wt 234 lb (106.1 kg)   BMI 36.65 kg/m   General:   Lethargic appearing overweight male appears to be Well developed, Well nourished and cooperative Head:  Normocephalic and atraumatic. Eyes:   PEERL, EOMI. No icterus. Conjunctiva pink. Ears:  Normal auditory acuity. Neck:  Supple Throat: Oral cavity and pharynx without inflammation, swelling or lesion.  Lungs: Respirations even and unlabored. Lungs clear to auscultation bilaterally.   No wheezes, crackles, or rhonchi.  Heart: Normal S1, S2. No MRG. Regular rate and rhythm. No peripheral edema, cyanosis or pallor.  Abdomen:  Soft, nondistended, mild TTP generalized worse in the right lower quadrant over a scar from patient's previous kidney transplant attempt (chronic per the patient) No rebound or guarding. Normal bowel sounds. No appreciable  masses or hepatomegaly. Rectal:  Not performed.  Msk:  Symmetrical without gross deformities.  Extremities:  Without edema, no deformity or joint abnormality.  Neurologic:  Alert and  oriented x4;  grossly normal neurologically.  Skin:   Dry and intact without significant lesions or rashes. Psychiatric: Oriented to person, place and time. Demonstrates good judgement and reason without abnormal affect or behaviors.  No relevant recent labs or imaging  Assessment: 1. Diarrhea: Patient reports 2-4 more loose bowel movements per day which have been "orangey/yellow", associated with some bloating and generalized abdominal discomfort, this morning the patient did have a solid brown stool for the first time in a week; consider IBS versus viral gastroenteritis versus other 2. GERD: uncontrolled currently, patient is off of his omeprazole 20 mg daily, which did not seem to help that much anyways, currently using Tums, last EGD in 2013 with findings of gastritis 3. Fatigue: Chronic per patient, being evaluated by PCP 4. End-stage renal disease on hemodialysis: Monday Wednesday Friday schedule  Plan: 1. Recommend stool studies to include GI pathogen panel, O and P, H/pylori fecal antigen and C. Difficile; if these are negative can discuss anti-diarrheals 2. Recommend the patient start a daily probiotic such as align, this seemed to help him in the past. Coupon was provided. 3. Prescribed omeprazole 40 mg daily, 30-60 minutes before eating. Did discuss with the patient that if this does not resolve his reflux/heartburn symptoms, we could increase this to twice a day dosing. 4. Per chart review patient is not due for screening colonoscopy until next year, could consider this earlier if symptoms continue 5. Instructed patient to continue to follow with his internal medicine doctor regarding his chronic fatigue 6. Patient to follow with Dr. Ardis Hughs in the next 3-4 weeks.  Ellouise Newer, PA-C [facility] 10/04/2015, 9:45 AM  Cc: Estill Dooms, MD

## 2015-10-04 NOTE — Progress Notes (Signed)
I agree with the above note, plan 

## 2015-10-05 DIAGNOSIS — Z23 Encounter for immunization: Secondary | ICD-10-CM | POA: Diagnosis not present

## 2015-10-05 DIAGNOSIS — N2581 Secondary hyperparathyroidism of renal origin: Secondary | ICD-10-CM | POA: Diagnosis not present

## 2015-10-05 DIAGNOSIS — E089 Diabetes mellitus due to underlying condition without complications: Secondary | ICD-10-CM | POA: Diagnosis not present

## 2015-10-05 DIAGNOSIS — D631 Anemia in chronic kidney disease: Secondary | ICD-10-CM | POA: Diagnosis not present

## 2015-10-05 DIAGNOSIS — N186 End stage renal disease: Secondary | ICD-10-CM | POA: Diagnosis not present

## 2015-10-05 LAB — OVA AND PARASITE EXAMINATION: OP: NONE SEEN

## 2015-10-05 LAB — CLOSTRIDIUM DIFFICILE BY PCR: Toxigenic C. Difficile by PCR: NOT DETECTED

## 2015-10-06 LAB — H. PYLORI ANTIGEN, STOOL: H pylori Ag, Stl: NEGATIVE

## 2015-10-07 DIAGNOSIS — Z23 Encounter for immunization: Secondary | ICD-10-CM | POA: Diagnosis not present

## 2015-10-07 DIAGNOSIS — N2581 Secondary hyperparathyroidism of renal origin: Secondary | ICD-10-CM | POA: Diagnosis not present

## 2015-10-07 DIAGNOSIS — E089 Diabetes mellitus due to underlying condition without complications: Secondary | ICD-10-CM | POA: Diagnosis not present

## 2015-10-07 DIAGNOSIS — D631 Anemia in chronic kidney disease: Secondary | ICD-10-CM | POA: Diagnosis not present

## 2015-10-07 DIAGNOSIS — N186 End stage renal disease: Secondary | ICD-10-CM | POA: Diagnosis not present

## 2015-10-07 LAB — GASTROINTESTINAL PATHOGEN PANEL PCR
C. DIFFICILE TOX A/B, PCR: DETECTED — AB
CAMPYLOBACTER, PCR: NOT DETECTED
CRYPTOSPORIDIUM, PCR: NOT DETECTED
E COLI 0157, PCR: NOT DETECTED
E coli (ETEC) LT/ST PCR: NOT DETECTED
E coli (STEC) stx1/stx2, PCR: NOT DETECTED
GIARDIA LAMBLIA, PCR: NOT DETECTED
Norovirus, PCR: NOT DETECTED
Rotavirus A, PCR: NOT DETECTED
Salmonella, PCR: NOT DETECTED
Shigella, PCR: NOT DETECTED

## 2015-10-08 ENCOUNTER — Telehealth: Payer: Self-pay | Admitting: Gastroenterology

## 2015-10-08 NOTE — Telephone Encounter (Signed)
I received a phone call after hours, this patient's GI pathogen panel returned positive for C Diff. Interestingly his stool study for C Diff previously was negative. I called him but have not been able to get through, left message. Will call back again. If his diarrhea persists would treat him for C Diff.

## 2015-10-09 ENCOUNTER — Other Ambulatory Visit: Payer: Self-pay | Admitting: Gastroenterology

## 2015-10-09 DIAGNOSIS — A0472 Enterocolitis due to Clostridium difficile, not specified as recurrent: Secondary | ICD-10-CM

## 2015-10-09 MED ORDER — VANCOMYCIN 50 MG/ML ORAL SOLUTION: 125.0000 mg | Freq: Four times a day (QID) | ORAL | Status: AC

## 2015-10-09 NOTE — Telephone Encounter (Signed)
Called back and spoke with the patient's wife. She reports the patient is continuing to have diarrhea. In this light will treat for C Diff given this result. Recommend vancomycin since he is on dialysis. Will treat with 125mg  po QID for 10 days. If no improvement he should call us back for reassessment.   Anderson Malta, FYI wanted to pass this along, this was resulted to be over the weekend.

## 2015-10-10 DIAGNOSIS — D631 Anemia in chronic kidney disease: Secondary | ICD-10-CM | POA: Diagnosis not present

## 2015-10-10 DIAGNOSIS — Z23 Encounter for immunization: Secondary | ICD-10-CM | POA: Diagnosis not present

## 2015-10-10 DIAGNOSIS — E089 Diabetes mellitus due to underlying condition without complications: Secondary | ICD-10-CM | POA: Diagnosis not present

## 2015-10-10 DIAGNOSIS — N2581 Secondary hyperparathyroidism of renal origin: Secondary | ICD-10-CM | POA: Diagnosis not present

## 2015-10-10 DIAGNOSIS — N186 End stage renal disease: Secondary | ICD-10-CM | POA: Diagnosis not present

## 2015-10-12 DIAGNOSIS — N2581 Secondary hyperparathyroidism of renal origin: Secondary | ICD-10-CM | POA: Diagnosis not present

## 2015-10-12 DIAGNOSIS — Z23 Encounter for immunization: Secondary | ICD-10-CM | POA: Diagnosis not present

## 2015-10-12 DIAGNOSIS — E089 Diabetes mellitus due to underlying condition without complications: Secondary | ICD-10-CM | POA: Diagnosis not present

## 2015-10-12 DIAGNOSIS — N186 End stage renal disease: Secondary | ICD-10-CM | POA: Diagnosis not present

## 2015-10-12 DIAGNOSIS — D631 Anemia in chronic kidney disease: Secondary | ICD-10-CM | POA: Diagnosis not present

## 2015-10-13 ENCOUNTER — Telehealth: Payer: Self-pay | Admitting: Physician Assistant

## 2015-10-13 ENCOUNTER — Other Ambulatory Visit: Payer: Self-pay

## 2015-10-13 MED ORDER — VANCOMYCIN 50 MG/ML ORAL SOLUTION: 125.0000 mg | Freq: Four times a day (QID) | ORAL | 0 refills | Status: AC

## 2015-10-13 NOTE — Telephone Encounter (Signed)
Pt prescription has been sent, Dr Havery Moros prescription was not prescribed it was ordered as inpatient med. Pt is aware

## 2015-10-13 NOTE — Telephone Encounter (Signed)
Christian Mate RN called and left a message for the patient to advise we sent a prescription  to [facility] [place] for the [place].

## 2015-10-14 DIAGNOSIS — N186 End stage renal disease: Secondary | ICD-10-CM | POA: Diagnosis not present

## 2015-10-14 DIAGNOSIS — Z23 Encounter for immunization: Secondary | ICD-10-CM | POA: Diagnosis not present

## 2015-10-14 DIAGNOSIS — E089 Diabetes mellitus due to underlying condition without complications: Secondary | ICD-10-CM | POA: Diagnosis not present

## 2015-10-14 DIAGNOSIS — N2581 Secondary hyperparathyroidism of renal origin: Secondary | ICD-10-CM | POA: Diagnosis not present

## 2015-10-14 DIAGNOSIS — D631 Anemia in chronic kidney disease: Secondary | ICD-10-CM | POA: Diagnosis not present

## 2015-10-17 DIAGNOSIS — Z23 Encounter for immunization: Secondary | ICD-10-CM | POA: Diagnosis not present

## 2015-10-17 DIAGNOSIS — E089 Diabetes mellitus due to underlying condition without complications: Secondary | ICD-10-CM | POA: Diagnosis not present

## 2015-10-17 DIAGNOSIS — N2581 Secondary hyperparathyroidism of renal origin: Secondary | ICD-10-CM | POA: Diagnosis not present

## 2015-10-17 DIAGNOSIS — D631 Anemia in chronic kidney disease: Secondary | ICD-10-CM | POA: Diagnosis not present

## 2015-10-17 DIAGNOSIS — N186 End stage renal disease: Secondary | ICD-10-CM | POA: Diagnosis not present

## 2015-10-19 DIAGNOSIS — E089 Diabetes mellitus due to underlying condition without complications: Secondary | ICD-10-CM | POA: Diagnosis not present

## 2015-10-19 DIAGNOSIS — D631 Anemia in chronic kidney disease: Secondary | ICD-10-CM | POA: Diagnosis not present

## 2015-10-19 DIAGNOSIS — Z23 Encounter for immunization: Secondary | ICD-10-CM | POA: Diagnosis not present

## 2015-10-19 DIAGNOSIS — N2581 Secondary hyperparathyroidism of renal origin: Secondary | ICD-10-CM | POA: Diagnosis not present

## 2015-10-19 DIAGNOSIS — N186 End stage renal disease: Secondary | ICD-10-CM | POA: Diagnosis not present

## 2015-10-21 DIAGNOSIS — E089 Diabetes mellitus due to underlying condition without complications: Secondary | ICD-10-CM | POA: Diagnosis not present

## 2015-10-21 DIAGNOSIS — D631 Anemia in chronic kidney disease: Secondary | ICD-10-CM | POA: Diagnosis not present

## 2015-10-21 DIAGNOSIS — N186 End stage renal disease: Secondary | ICD-10-CM | POA: Diagnosis not present

## 2015-10-21 DIAGNOSIS — N2581 Secondary hyperparathyroidism of renal origin: Secondary | ICD-10-CM | POA: Diagnosis not present

## 2015-10-21 DIAGNOSIS — Z23 Encounter for immunization: Secondary | ICD-10-CM | POA: Diagnosis not present

## 2015-10-22 DIAGNOSIS — Z992 Dependence on renal dialysis: Secondary | ICD-10-CM | POA: Diagnosis not present

## 2015-10-22 DIAGNOSIS — N186 End stage renal disease: Secondary | ICD-10-CM | POA: Diagnosis not present

## 2015-10-22 DIAGNOSIS — T8612 Kidney transplant failure: Secondary | ICD-10-CM | POA: Diagnosis not present

## 2015-10-24 DIAGNOSIS — D509 Iron deficiency anemia, unspecified: Secondary | ICD-10-CM | POA: Diagnosis not present

## 2015-10-24 DIAGNOSIS — N186 End stage renal disease: Secondary | ICD-10-CM | POA: Diagnosis not present

## 2015-10-24 DIAGNOSIS — E089 Diabetes mellitus due to underlying condition without complications: Secondary | ICD-10-CM | POA: Diagnosis not present

## 2015-10-24 DIAGNOSIS — N2581 Secondary hyperparathyroidism of renal origin: Secondary | ICD-10-CM | POA: Diagnosis not present

## 2015-10-24 DIAGNOSIS — D631 Anemia in chronic kidney disease: Secondary | ICD-10-CM | POA: Diagnosis not present

## 2015-10-26 DIAGNOSIS — N186 End stage renal disease: Secondary | ICD-10-CM | POA: Diagnosis not present

## 2015-10-26 DIAGNOSIS — N2581 Secondary hyperparathyroidism of renal origin: Secondary | ICD-10-CM | POA: Diagnosis not present

## 2015-10-26 DIAGNOSIS — E089 Diabetes mellitus due to underlying condition without complications: Secondary | ICD-10-CM | POA: Diagnosis not present

## 2015-10-26 DIAGNOSIS — D509 Iron deficiency anemia, unspecified: Secondary | ICD-10-CM | POA: Diagnosis not present

## 2015-10-26 DIAGNOSIS — D631 Anemia in chronic kidney disease: Secondary | ICD-10-CM | POA: Diagnosis not present

## 2015-10-28 DIAGNOSIS — N2581 Secondary hyperparathyroidism of renal origin: Secondary | ICD-10-CM | POA: Diagnosis not present

## 2015-10-28 DIAGNOSIS — N186 End stage renal disease: Secondary | ICD-10-CM | POA: Diagnosis not present

## 2015-10-28 DIAGNOSIS — D509 Iron deficiency anemia, unspecified: Secondary | ICD-10-CM | POA: Diagnosis not present

## 2015-10-28 DIAGNOSIS — E089 Diabetes mellitus due to underlying condition without complications: Secondary | ICD-10-CM | POA: Diagnosis not present

## 2015-10-28 DIAGNOSIS — D631 Anemia in chronic kidney disease: Secondary | ICD-10-CM | POA: Diagnosis not present

## 2015-10-31 DIAGNOSIS — N186 End stage renal disease: Secondary | ICD-10-CM | POA: Diagnosis not present

## 2015-10-31 DIAGNOSIS — D631 Anemia in chronic kidney disease: Secondary | ICD-10-CM | POA: Diagnosis not present

## 2015-10-31 DIAGNOSIS — N2581 Secondary hyperparathyroidism of renal origin: Secondary | ICD-10-CM | POA: Diagnosis not present

## 2015-10-31 DIAGNOSIS — E089 Diabetes mellitus due to underlying condition without complications: Secondary | ICD-10-CM | POA: Diagnosis not present

## 2015-10-31 DIAGNOSIS — D509 Iron deficiency anemia, unspecified: Secondary | ICD-10-CM | POA: Diagnosis not present

## 2015-11-02 ENCOUNTER — Other Ambulatory Visit: Payer: Self-pay

## 2015-11-02 ENCOUNTER — Encounter: Payer: Self-pay | Admitting: Physician Assistant

## 2015-11-02 DIAGNOSIS — N2581 Secondary hyperparathyroidism of renal origin: Secondary | ICD-10-CM | POA: Diagnosis not present

## 2015-11-02 DIAGNOSIS — N186 End stage renal disease: Secondary | ICD-10-CM | POA: Diagnosis not present

## 2015-11-02 DIAGNOSIS — D509 Iron deficiency anemia, unspecified: Secondary | ICD-10-CM | POA: Diagnosis not present

## 2015-11-02 DIAGNOSIS — D631 Anemia in chronic kidney disease: Secondary | ICD-10-CM | POA: Diagnosis not present

## 2015-11-02 DIAGNOSIS — E089 Diabetes mellitus due to underlying condition without complications: Secondary | ICD-10-CM | POA: Diagnosis not present

## 2015-11-02 MED ORDER — VANCOMYCIN HCL 125 MG PO CAPS: 125.0000 mg | ORAL_CAPSULE | Freq: Four times a day (QID) | ORAL | 0 refills | Status: AC

## 2015-11-02 NOTE — Progress Notes (Signed)
Patient never received vancomycin 125 mg po four times daily for 10 days. [facility] in [place], pharmacist verified that they can get this medication, stated that it was sent in wrong back in Sept.

## 2015-11-04 DIAGNOSIS — N186 End stage renal disease: Secondary | ICD-10-CM | POA: Diagnosis not present

## 2015-11-04 DIAGNOSIS — E089 Diabetes mellitus due to underlying condition without complications: Secondary | ICD-10-CM | POA: Diagnosis not present

## 2015-11-04 DIAGNOSIS — D631 Anemia in chronic kidney disease: Secondary | ICD-10-CM | POA: Diagnosis not present

## 2015-11-04 DIAGNOSIS — D509 Iron deficiency anemia, unspecified: Secondary | ICD-10-CM | POA: Diagnosis not present

## 2015-11-04 DIAGNOSIS — N2581 Secondary hyperparathyroidism of renal origin: Secondary | ICD-10-CM | POA: Diagnosis not present

## 2015-11-07 DIAGNOSIS — N2581 Secondary hyperparathyroidism of renal origin: Secondary | ICD-10-CM | POA: Diagnosis not present

## 2015-11-07 DIAGNOSIS — E089 Diabetes mellitus due to underlying condition without complications: Secondary | ICD-10-CM | POA: Diagnosis not present

## 2015-11-07 DIAGNOSIS — D509 Iron deficiency anemia, unspecified: Secondary | ICD-10-CM | POA: Diagnosis not present

## 2015-11-07 DIAGNOSIS — N186 End stage renal disease: Secondary | ICD-10-CM | POA: Diagnosis not present

## 2015-11-07 DIAGNOSIS — D631 Anemia in chronic kidney disease: Secondary | ICD-10-CM | POA: Diagnosis not present

## 2015-11-09 DIAGNOSIS — D631 Anemia in chronic kidney disease: Secondary | ICD-10-CM | POA: Diagnosis not present

## 2015-11-09 DIAGNOSIS — N186 End stage renal disease: Secondary | ICD-10-CM | POA: Diagnosis not present

## 2015-11-09 DIAGNOSIS — E089 Diabetes mellitus due to underlying condition without complications: Secondary | ICD-10-CM | POA: Diagnosis not present

## 2015-11-09 DIAGNOSIS — N2581 Secondary hyperparathyroidism of renal origin: Secondary | ICD-10-CM | POA: Diagnosis not present

## 2015-11-09 DIAGNOSIS — D509 Iron deficiency anemia, unspecified: Secondary | ICD-10-CM | POA: Diagnosis not present

## 2015-11-10 ENCOUNTER — Telehealth: Payer: Self-pay | Admitting: Internal Medicine

## 2015-11-10 NOTE — Telephone Encounter (Signed)
left msg asking pt to call and schedule AWV possibly same day as scheduled office visit. VDM (dee-dee)

## 2015-11-11 DIAGNOSIS — E089 Diabetes mellitus due to underlying condition without complications: Secondary | ICD-10-CM | POA: Diagnosis not present

## 2015-11-11 DIAGNOSIS — N2581 Secondary hyperparathyroidism of renal origin: Secondary | ICD-10-CM | POA: Diagnosis not present

## 2015-11-11 DIAGNOSIS — D631 Anemia in chronic kidney disease: Secondary | ICD-10-CM | POA: Diagnosis not present

## 2015-11-11 DIAGNOSIS — N186 End stage renal disease: Secondary | ICD-10-CM | POA: Diagnosis not present

## 2015-11-11 DIAGNOSIS — D509 Iron deficiency anemia, unspecified: Secondary | ICD-10-CM | POA: Diagnosis not present

## 2015-11-14 DIAGNOSIS — D631 Anemia in chronic kidney disease: Secondary | ICD-10-CM | POA: Diagnosis not present

## 2015-11-14 DIAGNOSIS — N186 End stage renal disease: Secondary | ICD-10-CM | POA: Diagnosis not present

## 2015-11-14 DIAGNOSIS — D509 Iron deficiency anemia, unspecified: Secondary | ICD-10-CM | POA: Diagnosis not present

## 2015-11-14 DIAGNOSIS — N2581 Secondary hyperparathyroidism of renal origin: Secondary | ICD-10-CM | POA: Diagnosis not present

## 2015-11-14 DIAGNOSIS — E089 Diabetes mellitus due to underlying condition without complications: Secondary | ICD-10-CM | POA: Diagnosis not present

## 2015-11-16 DIAGNOSIS — D509 Iron deficiency anemia, unspecified: Secondary | ICD-10-CM | POA: Diagnosis not present

## 2015-11-16 DIAGNOSIS — N186 End stage renal disease: Secondary | ICD-10-CM | POA: Diagnosis not present

## 2015-11-16 DIAGNOSIS — D631 Anemia in chronic kidney disease: Secondary | ICD-10-CM | POA: Diagnosis not present

## 2015-11-16 DIAGNOSIS — E089 Diabetes mellitus due to underlying condition without complications: Secondary | ICD-10-CM | POA: Diagnosis not present

## 2015-11-16 DIAGNOSIS — N2581 Secondary hyperparathyroidism of renal origin: Secondary | ICD-10-CM | POA: Diagnosis not present

## 2015-11-16 NOTE — Telephone Encounter (Signed)
error 

## 2015-11-18 DIAGNOSIS — N2581 Secondary hyperparathyroidism of renal origin: Secondary | ICD-10-CM | POA: Diagnosis not present

## 2015-11-18 DIAGNOSIS — E089 Diabetes mellitus due to underlying condition without complications: Secondary | ICD-10-CM | POA: Diagnosis not present

## 2015-11-18 DIAGNOSIS — N186 End stage renal disease: Secondary | ICD-10-CM | POA: Diagnosis not present

## 2015-11-18 DIAGNOSIS — D509 Iron deficiency anemia, unspecified: Secondary | ICD-10-CM | POA: Diagnosis not present

## 2015-11-18 DIAGNOSIS — D631 Anemia in chronic kidney disease: Secondary | ICD-10-CM | POA: Diagnosis not present

## 2015-11-21 DIAGNOSIS — D631 Anemia in chronic kidney disease: Secondary | ICD-10-CM | POA: Diagnosis not present

## 2015-11-21 DIAGNOSIS — N186 End stage renal disease: Secondary | ICD-10-CM | POA: Diagnosis not present

## 2015-11-21 DIAGNOSIS — E089 Diabetes mellitus due to underlying condition without complications: Secondary | ICD-10-CM | POA: Diagnosis not present

## 2015-11-21 DIAGNOSIS — D509 Iron deficiency anemia, unspecified: Secondary | ICD-10-CM | POA: Diagnosis not present

## 2015-11-21 DIAGNOSIS — N2581 Secondary hyperparathyroidism of renal origin: Secondary | ICD-10-CM | POA: Diagnosis not present

## 2015-11-22 DIAGNOSIS — Z992 Dependence on renal dialysis: Secondary | ICD-10-CM | POA: Diagnosis not present

## 2015-11-22 DIAGNOSIS — T8612 Kidney transplant failure: Secondary | ICD-10-CM | POA: Diagnosis not present

## 2015-11-22 DIAGNOSIS — N186 End stage renal disease: Secondary | ICD-10-CM | POA: Diagnosis not present

## 2015-11-23 DIAGNOSIS — N2581 Secondary hyperparathyroidism of renal origin: Secondary | ICD-10-CM | POA: Diagnosis not present

## 2015-11-23 DIAGNOSIS — E089 Diabetes mellitus due to underlying condition without complications: Secondary | ICD-10-CM | POA: Diagnosis not present

## 2015-11-23 DIAGNOSIS — R300 Dysuria: Secondary | ICD-10-CM | POA: Diagnosis not present

## 2015-11-23 DIAGNOSIS — D509 Iron deficiency anemia, unspecified: Secondary | ICD-10-CM | POA: Diagnosis not present

## 2015-11-23 DIAGNOSIS — N186 End stage renal disease: Secondary | ICD-10-CM | POA: Diagnosis not present

## 2015-11-23 DIAGNOSIS — D631 Anemia in chronic kidney disease: Secondary | ICD-10-CM | POA: Diagnosis not present

## 2015-11-25 DIAGNOSIS — N2581 Secondary hyperparathyroidism of renal origin: Secondary | ICD-10-CM | POA: Diagnosis not present

## 2015-11-25 DIAGNOSIS — E089 Diabetes mellitus due to underlying condition without complications: Secondary | ICD-10-CM | POA: Diagnosis not present

## 2015-11-25 DIAGNOSIS — N186 End stage renal disease: Secondary | ICD-10-CM | POA: Diagnosis not present

## 2015-11-25 DIAGNOSIS — D509 Iron deficiency anemia, unspecified: Secondary | ICD-10-CM | POA: Diagnosis not present

## 2015-11-25 DIAGNOSIS — D631 Anemia in chronic kidney disease: Secondary | ICD-10-CM | POA: Diagnosis not present

## 2015-11-28 DIAGNOSIS — D509 Iron deficiency anemia, unspecified: Secondary | ICD-10-CM | POA: Diagnosis not present

## 2015-11-28 DIAGNOSIS — D631 Anemia in chronic kidney disease: Secondary | ICD-10-CM | POA: Diagnosis not present

## 2015-11-28 DIAGNOSIS — N186 End stage renal disease: Secondary | ICD-10-CM | POA: Diagnosis not present

## 2015-11-28 DIAGNOSIS — N2581 Secondary hyperparathyroidism of renal origin: Secondary | ICD-10-CM | POA: Diagnosis not present

## 2015-11-28 DIAGNOSIS — E089 Diabetes mellitus due to underlying condition without complications: Secondary | ICD-10-CM | POA: Diagnosis not present

## 2015-11-30 DIAGNOSIS — D631 Anemia in chronic kidney disease: Secondary | ICD-10-CM | POA: Diagnosis not present

## 2015-11-30 DIAGNOSIS — N186 End stage renal disease: Secondary | ICD-10-CM | POA: Diagnosis not present

## 2015-11-30 DIAGNOSIS — D509 Iron deficiency anemia, unspecified: Secondary | ICD-10-CM | POA: Diagnosis not present

## 2015-11-30 DIAGNOSIS — N2581 Secondary hyperparathyroidism of renal origin: Secondary | ICD-10-CM | POA: Diagnosis not present

## 2015-11-30 DIAGNOSIS — E089 Diabetes mellitus due to underlying condition without complications: Secondary | ICD-10-CM | POA: Diagnosis not present

## 2015-12-01 ENCOUNTER — Other Ambulatory Visit: Payer: Self-pay | Admitting: Physician Assistant

## 2015-12-02 DIAGNOSIS — D631 Anemia in chronic kidney disease: Secondary | ICD-10-CM | POA: Diagnosis not present

## 2015-12-02 DIAGNOSIS — E089 Diabetes mellitus due to underlying condition without complications: Secondary | ICD-10-CM | POA: Diagnosis not present

## 2015-12-02 DIAGNOSIS — N186 End stage renal disease: Secondary | ICD-10-CM | POA: Diagnosis not present

## 2015-12-02 DIAGNOSIS — N2581 Secondary hyperparathyroidism of renal origin: Secondary | ICD-10-CM | POA: Diagnosis not present

## 2015-12-02 DIAGNOSIS — D509 Iron deficiency anemia, unspecified: Secondary | ICD-10-CM | POA: Diagnosis not present

## 2015-12-05 DIAGNOSIS — D509 Iron deficiency anemia, unspecified: Secondary | ICD-10-CM | POA: Diagnosis not present

## 2015-12-05 DIAGNOSIS — N2581 Secondary hyperparathyroidism of renal origin: Secondary | ICD-10-CM | POA: Diagnosis not present

## 2015-12-05 DIAGNOSIS — D631 Anemia in chronic kidney disease: Secondary | ICD-10-CM | POA: Diagnosis not present

## 2015-12-05 DIAGNOSIS — N186 End stage renal disease: Secondary | ICD-10-CM | POA: Diagnosis not present

## 2015-12-05 DIAGNOSIS — E089 Diabetes mellitus due to underlying condition without complications: Secondary | ICD-10-CM | POA: Diagnosis not present

## 2015-12-07 ENCOUNTER — Emergency Department (HOSPITAL_COMMUNITY): Payer: Medicare Other

## 2015-12-07 ENCOUNTER — Encounter (HOSPITAL_COMMUNITY): Payer: Self-pay | Admitting: *Deleted

## 2015-12-07 ENCOUNTER — Inpatient Hospital Stay (HOSPITAL_COMMUNITY)
Admission: EM | Admit: 2015-12-07 | Discharge: 2015-12-11 | DRG: 193 | Disposition: A | Discharge status: Home / Self Care | Payer: Medicare Other | Attending: Internal Medicine | Admitting: Internal Medicine

## 2015-12-07 DIAGNOSIS — Z79891 Long term (current) use of opiate analgesic: Secondary | ICD-10-CM

## 2015-12-07 DIAGNOSIS — Z9989 Dependence on other enabling machines and devices: Secondary | ICD-10-CM

## 2015-12-07 DIAGNOSIS — R0789 Other chest pain: Secondary | ICD-10-CM | POA: Diagnosis not present

## 2015-12-07 DIAGNOSIS — J189 Pneumonia, unspecified organism: Principal | ICD-10-CM | POA: Diagnosis present

## 2015-12-07 DIAGNOSIS — N189 Chronic kidney disease, unspecified: Secondary | ICD-10-CM

## 2015-12-07 DIAGNOSIS — N2581 Secondary hyperparathyroidism of renal origin: Secondary | ICD-10-CM | POA: Diagnosis not present

## 2015-12-07 DIAGNOSIS — D696 Thrombocytopenia, unspecified: Secondary | ICD-10-CM | POA: Diagnosis not present

## 2015-12-07 DIAGNOSIS — G4710 Hypersomnia, unspecified: Secondary | ICD-10-CM | POA: Diagnosis present

## 2015-12-07 DIAGNOSIS — I10 Essential (primary) hypertension: Secondary | ICD-10-CM | POA: Diagnosis present

## 2015-12-07 DIAGNOSIS — Z940 Kidney transplant status: Secondary | ICD-10-CM

## 2015-12-07 DIAGNOSIS — J9601 Acute respiratory failure with hypoxia: Secondary | ICD-10-CM | POA: Diagnosis present

## 2015-12-07 DIAGNOSIS — E1122 Type 2 diabetes mellitus with diabetic chronic kidney disease: Secondary | ICD-10-CM | POA: Diagnosis present

## 2015-12-07 DIAGNOSIS — I120 Hypertensive chronic kidney disease with stage 5 chronic kidney disease or end stage renal disease: Secondary | ICD-10-CM | POA: Diagnosis not present

## 2015-12-07 DIAGNOSIS — M109 Gout, unspecified: Secondary | ICD-10-CM | POA: Diagnosis present

## 2015-12-07 DIAGNOSIS — D61818 Other pancytopenia: Secondary | ICD-10-CM | POA: Diagnosis not present

## 2015-12-07 DIAGNOSIS — E669 Obesity, unspecified: Secondary | ICD-10-CM | POA: Diagnosis present

## 2015-12-07 DIAGNOSIS — N186 End stage renal disease: Secondary | ICD-10-CM

## 2015-12-07 DIAGNOSIS — E785 Hyperlipidemia, unspecified: Secondary | ICD-10-CM | POA: Diagnosis present

## 2015-12-07 DIAGNOSIS — D509 Iron deficiency anemia, unspecified: Secondary | ICD-10-CM | POA: Diagnosis not present

## 2015-12-07 DIAGNOSIS — N400 Enlarged prostate without lower urinary tract symptoms: Secondary | ICD-10-CM | POA: Diagnosis present

## 2015-12-07 DIAGNOSIS — D631 Anemia in chronic kidney disease: Secondary | ICD-10-CM | POA: Diagnosis not present

## 2015-12-07 DIAGNOSIS — Z885 Allergy status to narcotic agent status: Secondary | ICD-10-CM

## 2015-12-07 DIAGNOSIS — G4489 Other headache syndrome: Secondary | ICD-10-CM | POA: Diagnosis present

## 2015-12-07 DIAGNOSIS — Y95 Nosocomial condition: Secondary | ICD-10-CM | POA: Diagnosis present

## 2015-12-07 DIAGNOSIS — E8889 Other specified metabolic disorders: Secondary | ICD-10-CM | POA: Diagnosis present

## 2015-12-07 DIAGNOSIS — Z87891 Personal history of nicotine dependence: Secondary | ICD-10-CM | POA: Diagnosis not present

## 2015-12-07 DIAGNOSIS — Z992 Dependence on renal dialysis: Secondary | ICD-10-CM

## 2015-12-07 DIAGNOSIS — E089 Diabetes mellitus due to underlying condition without complications: Secondary | ICD-10-CM | POA: Diagnosis not present

## 2015-12-07 DIAGNOSIS — Z981 Arthrodesis status: Secondary | ICD-10-CM

## 2015-12-07 DIAGNOSIS — Z79899 Other long term (current) drug therapy: Secondary | ICD-10-CM

## 2015-12-07 DIAGNOSIS — E1142 Type 2 diabetes mellitus with diabetic polyneuropathy: Secondary | ICD-10-CM | POA: Diagnosis present

## 2015-12-07 DIAGNOSIS — Z6836 Body mass index (BMI) 36.0-36.9, adult: Secondary | ICD-10-CM

## 2015-12-07 DIAGNOSIS — E1129 Type 2 diabetes mellitus with other diabetic kidney complication: Secondary | ICD-10-CM | POA: Diagnosis present

## 2015-12-07 DIAGNOSIS — G40209 Localization-related (focal) (partial) symptomatic epilepsy and epileptic syndromes with complex partial seizures, not intractable, without status epilepticus: Secondary | ICD-10-CM | POA: Diagnosis present

## 2015-12-07 DIAGNOSIS — K219 Gastro-esophageal reflux disease without esophagitis: Secondary | ICD-10-CM | POA: Diagnosis present

## 2015-12-07 DIAGNOSIS — Z7951 Long term (current) use of inhaled steroids: Secondary | ICD-10-CM

## 2015-12-07 DIAGNOSIS — Z905 Acquired absence of kidney: Secondary | ICD-10-CM

## 2015-12-07 DIAGNOSIS — I959 Hypotension, unspecified: Secondary | ICD-10-CM | POA: Diagnosis present

## 2015-12-07 DIAGNOSIS — I6789 Other cerebrovascular disease: Secondary | ICD-10-CM | POA: Diagnosis present

## 2015-12-07 DIAGNOSIS — F419 Anxiety disorder, unspecified: Secondary | ICD-10-CM | POA: Diagnosis present

## 2015-12-07 DIAGNOSIS — Z8701 Personal history of pneumonia (recurrent): Secondary | ICD-10-CM

## 2015-12-07 DIAGNOSIS — G4700 Insomnia, unspecified: Secondary | ICD-10-CM | POA: Diagnosis present

## 2015-12-07 DIAGNOSIS — R112 Nausea with vomiting, unspecified: Secondary | ICD-10-CM | POA: Diagnosis not present

## 2015-12-07 DIAGNOSIS — K7689 Other specified diseases of liver: Secondary | ICD-10-CM | POA: Diagnosis present

## 2015-12-07 DIAGNOSIS — Z7982 Long term (current) use of aspirin: Secondary | ICD-10-CM

## 2015-12-07 DIAGNOSIS — Z85828 Personal history of other malignant neoplasm of skin: Secondary | ICD-10-CM

## 2015-12-07 DIAGNOSIS — J181 Lobar pneumonia, unspecified organism: Secondary | ICD-10-CM | POA: Diagnosis not present

## 2015-12-07 DIAGNOSIS — G4733 Obstructive sleep apnea (adult) (pediatric): Secondary | ICD-10-CM

## 2015-12-07 DIAGNOSIS — H9190 Unspecified hearing loss, unspecified ear: Secondary | ICD-10-CM | POA: Diagnosis present

## 2015-12-07 DIAGNOSIS — R05 Cough: Secondary | ICD-10-CM | POA: Diagnosis not present

## 2015-12-07 DIAGNOSIS — J4530 Mild persistent asthma, uncomplicated: Secondary | ICD-10-CM | POA: Diagnosis present

## 2015-12-07 LAB — BASIC METABOLIC PANEL
ANION GAP: 10 (ref 5–15)
BUN: 18 mg/dL (ref 6–20)
CHLORIDE: 99 mmol/L — AB (ref 101–111)
CO2: 31 mmol/L (ref 22–32)
Calcium: 8.9 mg/dL (ref 8.9–10.3)
Creatinine, Ser: 7.22 mg/dL — ABNORMAL HIGH (ref 0.61–1.24)
GFR calc Af Amer: 9 mL/min — ABNORMAL LOW (ref >60)
GFR, EST NON AFRICAN AMERICAN: 8 mL/min — AB (ref >60)
GLUCOSE: 122 mg/dL — AB (ref 65–99)
POTASSIUM: 4.2 mmol/L (ref 3.5–5.1)
Sodium: 140 mmol/L (ref 135–145)

## 2015-12-07 LAB — I-STAT TROPONIN, ED: Troponin i, poc: 0 ng/mL (ref 0.00–0.08)

## 2015-12-07 LAB — CBC
HEMATOCRIT: 31.5 % — AB (ref 39.0–52.0)
HEMOGLOBIN: 10.2 g/dL — AB (ref 13.0–17.0)
MCH: 32 pg (ref 26.0–34.0)
MCHC: 32.4 g/dL (ref 30.0–36.0)
MCV: 98.7 fL (ref 78.0–100.0)
Platelets: 79 10*3/uL — ABNORMAL LOW (ref 150–400)
RBC: 3.19 MIL/uL — ABNORMAL LOW (ref 4.22–5.81)
RDW: 14 % (ref 11.5–15.5)
WBC: 7.6 10*3/uL (ref 4.0–10.5)

## 2015-12-07 LAB — TROPONIN I: Troponin I: <0.03 ng/mL (ref <0.03)

## 2015-12-07 NOTE — ED Triage Notes (Signed)
Pt c/o central chest pain that started today; pt states he had dialysis today and has felt generalized body aches with the chest pain

## 2015-12-07 NOTE — ED Provider Notes (Signed)
[facility] DEPT Provider Note   CSN: [id number] Arrival date & time: 12/07/15  2052  By signing my name below, I, Royce Macadamia, attest that this documentation has been prepared under the direction and in the presence of Fredia Sorrow, MD . Electronically Signed: Royce Macadamia, [place]. 12/07/2015. 11:03 PM.  History   Chief Complaint Chief Complaint  Patient presents with  . Chest Pain    The history is provided by the patient and medical records. No language interpreter was used.     HPI Comments:  Kirk [name] is a 55 y.o. male who presents to the Emergency Department complaining of  Intermittent substernal chest pain onset yesterday around 1300.  At this time his pain is a 0/10, but during flair ups it is a 10/10.  He reports that he developed SOB and a cough 2 nights ago and notes that coughing makes his chest pain worse.  Pt had dialysis today; he is normally dialyzed MWF.  Pt had similar pain in the past and was diagnosed with pneumonia.  His wife states that his initial x-ray was negative for pneumonia, but it was present on the CT.      Past Medical History:  Diagnosis Date  . Acute edema of lung, unspecified   . Acute, but ill-defined, cerebrovascular disease   . 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)   . Anxiety   . Asthma   . Carpal tunnel syndrome   . Cellulitis and abscess of trunk   . Cholelithiasis 07/13/2014  . Chronic headaches   . Debility, unspecified   . Dermatophytosis of the body   . Dysrhythmia    history of  . Edema   . End stage renal disease on dialysis [facility])    "MWF; [facility]" (10/21/2014)  . Essential hypertension, benign   . Gout, unspecified   . HTN (hypertension)   . Hypertrophy of prostate without urinary obstruction and other lower urinary tract symptoms (LUTS)   . Hypotension, unspecified   . Impotence of organic origin   . Insomnia, unspecified   . Kidney replaced by transplant   .  Localization-related (focal) (partial) epilepsy and epileptic syndromes with complex partial seizures, without mention of intractable epilepsy   . Lumbago   . Memory loss   . OSA on CPAP   . Other and unspecified hyperlipidemia   . Other chronic nonalcoholic liver disease   . Other malaise and fatigue   . Other nonspecific abnormal serum enzyme levels   . Pain in joint, lower leg   . Pain in joint, upper arm   . Pneumonia "several times"  . Renal dialysis status(V45.11) 02/05/2010   restarted 01/02/13 ofter renal trransplant failure  . Secondary hyperparathyroidism (of renal origin)   . Shortness of breath   . Tension headache   . Unspecified constipation   . Unspecified essential hypertension   . Unspecified hereditary and idiopathic peripheral neuropathy   . Unspecified vitamin D deficiency     Patient Active Problem List   Diagnosis Date Noted  . Palpitations 06/29/2015  . Need for hepatitis C screening test 06/29/2015  . Fatigue 06/29/2015  . Sleep difficulties 06/29/2015  . Dysuria 05/31/2015  . Myalgia 05/17/2015  . Acute dyspnea   . Pyrexia   . 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 Obesity hypoventilation syndrome ([place]) 12/09/2014  . Extrinsic asthma 12/09/2014  . Narcotic drug use 12/09/2014  . Hypersomnia with sleep apnea 12/09/2014  . SCC (squamous cell carcinoma), arm 11/09/2014  .  Mild persistent chronic asthma without complication 82/42/3536  . ESRD on dialysis ([place]) 09/09/2014  . HLD (hyperlipidemia) 09/09/2014  . Dyspnea 06/23/2014  . Pancytopenia ([place]) 04/28/2014  . Thrombocytopenia ([place]) 04/10/2014  . Fungal dermatitis 03/17/2014  . History of surgical procedure 09/17/2013  . Headache(784.0) 04/28/2013  . Awaiting organ transplant 02/13/2013  . HTN (hypertension)   . DM (diabetes mellitus), type 2 with renal complications ([place])   . Tension headache   . Memory loss   . Anemia in chronic renal disease   . Edema   . Pain in joint, lower leg  05/08/2012  . Thoracic or lumbosacral neuritis or radiculitis 03/27/2012  . Nerve root pain 03/27/2012  . History of kidney transplant 09/10/2011  . Acute rejection of kidney transplant 08/28/2011  . Hearing loss 08/16/2011  . Obesity 08/16/2011  . Difficulty hearing 08/16/2011  . OSA on CPAP 08/05/2011    Past Surgical History:  Procedure Laterality Date  . AV FISTULA PLACEMENT Left ?2010   "forearm; at [facility]"  . BACK SURGERY    . CARDIAC CATHETERIZATION  03/21/2011  . CHOLECYSTECTOMY N/A 10/21/2014   Procedure: LAPAROSCOPIC CHOLECYSTECTOMY WITH INTRAOPERATIVE CHOLANGIOGRAM;  Surgeon: Autumn Messing III, MD;  Location: [facility];  Service: General;  Laterality: N/A;  . COLONOSCOPY    . 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 KIDNEY TRANSPLANT  08/17/2011   [place]  10/21/2014   w/IOC  . LEFT HEART CATHETERIZATION WITH CORONARY ANGIOGRAM N/A 03/21/2011   Procedure: LEFT HEART CATHETERIZATION WITH CORONARY ANGIOGRAM;  Surgeon: [name], MD;  [facility] C[facility];  Service: Cardiovascular;  Laterality: N/A;  . NEPHRECTOMY  08/2013   removed transplaned kidney  . POSTERIOR FUSION CERVICAL SPINE  06/25/2012   for spinal stenosis  . VASECTOMY  2010       Home Medications    Prior to Admission medications   Medication Sig Start Date End Date Taking? Authorizing Provider  acetaminophen (TYLENOL) 500 MG tablet Take 1 tablet (500 mg total) by mouth every 6 (six) hours as needed for moderate pain. 09/12/14   Samuella Cota, MD  amLODipine (NORVASC) 10 MG tablet Take 1 tablet (10 mg total) by mouth daily. 02/08/15   Estill Dooms, MD  aspirin EC 81 MG tablet Take 81 mg by mouth daily.    Historical Provider, MD  b complex-vitamin c-folic acid (NEPHRO-VITE) 0.8 MG TABS tablet Take 1 tablet by mouth daily. 02/08/15   Estill Dooms, MD  budesonide-formoterol (SYMBICORT) 80-4.5 MCG/ACT inhaler Inhale 2 puffs into the lungs  2 (two) times daily. Patient taking differently: Inhale 2 puffs into the lungs 2 (two) times daily as needed.  09/21/14   Bonnielee Haff, MD  calcitRIOL (ROCALTROL) 0.25 MCG capsule Take 1 capsule (0.25 mcg total) by mouth every Monday, Wednesday, and Friday with hemodialysis. 09/21/14   Bonnielee Haff, MD  calcium acetate, Phos Binder, (PHOSLYRA) 667 MG/5ML SOLN Take 2,001 mg by mouth 3 (three) times daily with meals. And 667 mg with snack    Historical Provider, MD  carvedilol (COREG) 25 MG tablet Take 1 tablet (25 mg total) by mouth 2 (two) times daily. Take one tablet in the morning and one tablet in the evening 02/08/15   Estill Dooms, MD  Cholecalciferol (VITAMIN D3) 5000 UNITS TABS Take 1 tablet by mouth daily.    Historical Provider, MD  diphenhydrAMINE (BENADRYL) 25 MG tablet Take 50 mg by  mouth 2 (two) times daily.     Historical Provider, MD  doxazosin (CARDURA) 4 MG tablet Take 0.5 tablet  by mouth in the am and 0.5 tablet by mouth in the pm to regulate blood pressure. 02/08/15   Estill Dooms, MD  guaiFENesin-dextromethorphan (ROBITUSSIN DM) 100-10 MG/5ML syrup Take 10 mLs by mouth every 4 (four) hours as needed for cough.    Historical Provider, MD  omeprazole (PRILOSEC) 20 MG capsule TAKE 1 CAPSULE (20 MG TOTAL) BY MOUTH 2 (TWO) TIMES DAILY BEFORE A MEAL. 12/01/15   Levin Erp, PA  oxyCODONE-acetaminophen (PERCOCET/ROXICET) 5-325 MG tablet Take 1-2 tablets by mouth every 8 (eight) hours as needed for severe pain. 02/08/15   Estill Dooms, MD  pravastatin (PRAVACHOL) 40 MG tablet Take 1 tablet (40 mg total) by mouth every evening. 02/08/15   Estill Dooms, MD  promethazine (PHENERGAN) 25 MG tablet Take 1 tablet (25 mg total) by mouth every 8 (eight) hours as needed for nausea or vomiting. 07/13/14   Estill Dooms, MD  traMADol Veatrice Bourbon) 50 MG tablet One up to 4 times daly if needed for pain 05/10/15   Estill Dooms, MD    Family History Family History  Problem Relation Age of  Onset  . Adopted: Yes  . Colon cancer Neg Hx   . Esophageal cancer Neg Hx   . Rectal cancer Neg Hx   . Stomach cancer Neg Hx     Social History Social History  Substance Use Topics  . Smoking status: Former Smoker    Packs/day: 0.50    Years: 32.00    Types: Cigarettes    Quit date: 08/23/2014  . Smokeless tobacco: Never Used  . Alcohol use No    Makes some urine daily.  He denies abnormal   Allergies   Codeine   Review of Systems Review of Systems  Constitutional: Positive for fever.  HENT: Positive for congestion.   Eyes: Negative for visual disturbance.  Respiratory: Positive for cough.   Cardiovascular: Negative for leg swelling.  Gastrointestinal: Positive for diarrhea. Negative for abdominal distention, blood in stool, nausea and vomiting.  Genitourinary: Negative for dysuria and hematuria.  Musculoskeletal: Positive for myalgias.  Skin: Negative for rash.  Neurological: Positive for headaches.  Hematological: Bruises/bleeds easily.  Psychiatric/Behavioral: Negative for confusion.     Physical Exam Updated Vital Signs BP 164/58   Pulse 86   Temp 98.6 F (37 C) (Oral)   Resp (!) 28   Ht 5\' 7"  (1.702 m)   Wt 106.6 kg   SpO2 91%   BMI 36.81 kg/m   Physical Exam  Constitutional: He is oriented to person, place, and time. He appears well-developed and well-nourished. No distress.  HENT:  Head: Normocephalic and atraumatic.  Mouth/Throat: Oropharynx is clear and moist.  Eyes: Conjunctivae and EOM are normal. Pupils are equal, round, and reactive to light. No scleral icterus.  Cardiovascular: Normal rate and regular rhythm.   Pulmonary/Chest: Effort normal. He has rales (bilaterally).  Abdominal: Soft. Bowel sounds are normal. There is no tenderness.  Musculoskeletal:  AV fistula in left forearm. Good thrill.  No swelling in the ankles.    Neurological: He is alert and oriented to person, place, and time. No cranial nerve deficit or sensory deficit. He  exhibits normal muscle tone. Coordination normal.  Skin: Skin is warm and dry.  Psychiatric: He has a normal mood and affect.  Nursing note and vitals reviewed.    ED Treatments /  Results   DIAGNOSTIC STUDIES:  Oxygen Saturation is 94% on RA, NML by my interpretation.    COORDINATION OF CARE:  10:37 PM Discussed treatment plan with pt at bedside and pt agreed to plan.  Labs (all labs ordered are listed, but only abnormal results are displayed) Labs Reviewed  BASIC METABOLIC PANEL - Abnormal; Notable for the following:       Result Value   Chloride 99 (*)    Glucose, Bld 122 (*)    Creatinine, Ser 7.22 (*)    GFR calc non Af Amer 8 (*)    GFR calc Af Amer 9 (*)    All other components within normal limits  CBC - Abnormal; Notable for the following:    RBC 3.19 (*)    Hemoglobin 10.2 (*)    HCT 31.5 (*)    Platelets 79 (*)    All other components within normal limits  CULTURE, BLOOD (ROUTINE X 2)  CULTURE, BLOOD (ROUTINE X 2)  TROPONIN I  I-STAT TROPOININ, ED    EKG  EKG Interpretation  Date/Time:  Wednesday December 07 2015 21:04:04 EST Ventricular Rate:  86 PR Interval:  158 QRS Duration: 82 QT Interval:  404 QTC Calculation: 483 R Axis:   76 Text Interpretation:  Normal sinus rhythm Prolonged QT Abnormal ECG Confirmed by Rogene Houston  MD, Carra Brindley ([id number]) on 12/07/2015 10:16:24 PM       Radiology Dg Chest 2 View  Result Date: 12/07/2015 CLINICAL DATA:  Productive cough and fever for 2 days. EXAM: CHEST  2 VIEW COMPARISON:  05/17/2015 FINDINGS: Mild interstitial coarsening, possibly due to an interstitial infiltrate as it does appear new from 04/25 17. No airspace consolidation. No effusions. Hilar and mediastinal contours are normal and unchanged. IMPRESSION: Mild interstitial coarsening.  No consolidation or effusion. Electronically Signed   By: Andreas Newport M.D.   On: 12/07/2015 22:20   Ct Chest Wo Contrast  Result Date: 12/07/2015 CLINICAL DATA:   Intermittent substernal chest pain onset yesterday. Developed dyspnea and cough 2 nights ago. EXAM: CT CHEST WITHOUT CONTRAST TECHNIQUE: Multidetector CT imaging of the chest was performed following the standard protocol without IV contrast. COMPARISON:  CXR 12/07/2015, chest CT 05/17/2015 FINDINGS: Cardiovascular: The heart is top-normal in size with trace pericardial fluid. There is coronary arteriosclerosis along the LAD and minimally in the RCA. No aortic aneurysm. There is aortic atherosclerosis. Mediastinum/Nodes: No axillary, supraclavicular or mediastinal lymphadenopathy. Small paratracheal lymph nodes are noted up to 6 mm on the left and 9 mm on the right. There is a 15 mm subcarinal short axis lymph node. These findings may be reactive in etiology. The esophagus and trachea are unremarkable. Lungs/Pleura: Bilateral lower lobe pneumonic consolidations are seen left greater than right with air bronchograms on the left. Tree-in-bud densities consistent with bronchitic change are also seen in the lower lobes and lingula. No effusion or pneumothorax. Upper Abdomen: Cholecystectomy. No acute upper abdominal abnormality. Musculoskeletal: No acute nor suspicious osseous lesions. IMPRESSION: Bilateral left greater than right lower lobe pneumonic consolidations with bronchitic change in both lower lobes and lingula. Mild reactive mediastinal lymphadenopathy. Aortic atherosclerosis.  Coronary arteriosclerosis. Electronically Signed   By: Ashley Royalty M.D.   On: 12/07/2015 23:58    Procedures Procedures (including critical care time)  Medications Ordered in ED Medications  0.9 %  sodium chloride infusion (not administered)  ceFEPIme (MAXIPIME) 1 g in dextrose 5 % 50 mL IVPB (not administered)     Initial Impression / Assessment and  Plan / ED Course  I have reviewed the triage vital signs and the nursing notes.  Pertinent labs & imaging results that were available during my care of the patient were  reviewed by me and considered in my medical decision making (see chart for details).  Clinical Course    Patient is a dialysis patient. Patient normally dialyzes Monday Wednesdays and Fridays he was dialyzed today. Patient with a complaint of intermittent chest pain substernal made worse with coughing. Generalized body aches. Cough congestion patient is concerned about having pneumonia. Room air sats are 94%. Patient states that often regular chest x-rays are negative in them and they do a CT of the chest shows pneumonia. That is the case today. Patient's troponin negative EKG without acute changes and prolonged QT. Electrolytes look reasonable. No significant leukocytosis. Patient does have shortness of breath and tachypnea.  Blood culture sent. Not concerned with any vital signs consistent with sepsis. However since patient is a dialysis patient and immunocompromised we'll treat this as a HCAP.  Primary care doctor is Jeanmarie Hubert. We'll discuss with hospitalist for admission here.   Final Clinical Impressions(s) / ED Diagnoses   Final diagnoses:  HCAP (healthcare-associated pneumonia)  End stage renal disease ([place])    New Prescriptions New Prescriptions   No medications on file   I personally performed the services described in this documentation, which was scribed in my presence. The recorded information has been reviewed and is accurate.       Fredia Sorrow, MD 12/08/15 [phone]

## 2015-12-07 NOTE — ED Notes (Signed)
istat troponin 0.00

## 2015-12-08 ENCOUNTER — Encounter (HOSPITAL_COMMUNITY): Payer: Self-pay | Admitting: Family Medicine

## 2015-12-08 DIAGNOSIS — E785 Hyperlipidemia, unspecified: Secondary | ICD-10-CM | POA: Diagnosis present

## 2015-12-08 DIAGNOSIS — M109 Gout, unspecified: Secondary | ICD-10-CM | POA: Diagnosis present

## 2015-12-08 DIAGNOSIS — R112 Nausea with vomiting, unspecified: Secondary | ICD-10-CM | POA: Diagnosis not present

## 2015-12-08 DIAGNOSIS — J189 Pneumonia, unspecified organism: Secondary | ICD-10-CM | POA: Diagnosis not present

## 2015-12-08 DIAGNOSIS — G4733 Obstructive sleep apnea (adult) (pediatric): Secondary | ICD-10-CM

## 2015-12-08 DIAGNOSIS — N186 End stage renal disease: Secondary | ICD-10-CM | POA: Diagnosis not present

## 2015-12-08 DIAGNOSIS — I120 Hypertensive chronic kidney disease with stage 5 chronic kidney disease or end stage renal disease: Secondary | ICD-10-CM | POA: Diagnosis present

## 2015-12-08 DIAGNOSIS — G4700 Insomnia, unspecified: Secondary | ICD-10-CM | POA: Diagnosis present

## 2015-12-08 DIAGNOSIS — G40209 Localization-related (focal) (partial) symptomatic epilepsy and epileptic syndromes with complex partial seizures, not intractable, without status epilepticus: Secondary | ICD-10-CM | POA: Diagnosis present

## 2015-12-08 DIAGNOSIS — G4710 Hypersomnia, unspecified: Secondary | ICD-10-CM | POA: Diagnosis present

## 2015-12-08 DIAGNOSIS — Z9989 Dependence on other enabling machines and devices: Secondary | ICD-10-CM | POA: Diagnosis not present

## 2015-12-08 DIAGNOSIS — J9601 Acute respiratory failure with hypoxia: Secondary | ICD-10-CM | POA: Diagnosis not present

## 2015-12-08 DIAGNOSIS — Z940 Kidney transplant status: Secondary | ICD-10-CM | POA: Diagnosis not present

## 2015-12-08 DIAGNOSIS — E1122 Type 2 diabetes mellitus with diabetic chronic kidney disease: Secondary | ICD-10-CM | POA: Diagnosis present

## 2015-12-08 DIAGNOSIS — N400 Enlarged prostate without lower urinary tract symptoms: Secondary | ICD-10-CM | POA: Diagnosis present

## 2015-12-08 DIAGNOSIS — K219 Gastro-esophageal reflux disease without esophagitis: Secondary | ICD-10-CM | POA: Diagnosis present

## 2015-12-08 DIAGNOSIS — I959 Hypotension, unspecified: Secondary | ICD-10-CM | POA: Diagnosis present

## 2015-12-08 DIAGNOSIS — D61818 Other pancytopenia: Secondary | ICD-10-CM | POA: Diagnosis present

## 2015-12-08 DIAGNOSIS — E1121 Type 2 diabetes mellitus with diabetic nephropathy: Secondary | ICD-10-CM | POA: Diagnosis not present

## 2015-12-08 DIAGNOSIS — I10 Essential (primary) hypertension: Secondary | ICD-10-CM

## 2015-12-08 DIAGNOSIS — N2581 Secondary hyperparathyroidism of renal origin: Secondary | ICD-10-CM | POA: Diagnosis present

## 2015-12-08 DIAGNOSIS — Y95 Nosocomial condition: Secondary | ICD-10-CM | POA: Diagnosis present

## 2015-12-08 DIAGNOSIS — E1129 Type 2 diabetes mellitus with other diabetic kidney complication: Secondary | ICD-10-CM | POA: Diagnosis not present

## 2015-12-08 DIAGNOSIS — R0789 Other chest pain: Secondary | ICD-10-CM | POA: Diagnosis not present

## 2015-12-08 DIAGNOSIS — Z992 Dependence on renal dialysis: Secondary | ICD-10-CM | POA: Diagnosis not present

## 2015-12-08 DIAGNOSIS — D631 Anemia in chronic kidney disease: Secondary | ICD-10-CM | POA: Diagnosis present

## 2015-12-08 DIAGNOSIS — K7689 Other specified diseases of liver: Secondary | ICD-10-CM | POA: Diagnosis present

## 2015-12-08 DIAGNOSIS — J4530 Mild persistent asthma, uncomplicated: Secondary | ICD-10-CM | POA: Diagnosis present

## 2015-12-08 DIAGNOSIS — D638 Anemia in other chronic diseases classified elsewhere: Secondary | ICD-10-CM | POA: Diagnosis not present

## 2015-12-08 DIAGNOSIS — D696 Thrombocytopenia, unspecified: Secondary | ICD-10-CM

## 2015-12-08 DIAGNOSIS — E1142 Type 2 diabetes mellitus with diabetic polyneuropathy: Secondary | ICD-10-CM | POA: Diagnosis present

## 2015-12-08 DIAGNOSIS — I6789 Other cerebrovascular disease: Secondary | ICD-10-CM | POA: Diagnosis present

## 2015-12-08 LAB — RESPIRATORY PANEL BY PCR
Adenovirus: NOT DETECTED
Bordetella pertussis: NOT DETECTED
CORONAVIRUS OC43-RVPPCR: NOT DETECTED
Chlamydophila pneumoniae: NOT DETECTED
Coronavirus 229E: NOT DETECTED
Coronavirus HKU1: NOT DETECTED
Coronavirus NL63: NOT DETECTED
INFLUENZA A-RVPPCR: NOT DETECTED
INFLUENZA B-RVPPCR: NOT DETECTED
METAPNEUMOVIRUS-RVPPCR: NOT DETECTED
Mycoplasma pneumoniae: NOT DETECTED
PARAINFLUENZA VIRUS 1-RVPPCR: NOT DETECTED
PARAINFLUENZA VIRUS 2-RVPPCR: NOT DETECTED
PARAINFLUENZA VIRUS 4-RVPPCR: NOT DETECTED
Parainfluenza Virus 3: NOT DETECTED
RESPIRATORY SYNCYTIAL VIRUS-RVPPCR: NOT DETECTED
RHINOVIRUS / ENTEROVIRUS - RVPPCR: NOT DETECTED

## 2015-12-08 LAB — MRSA PCR SCREENING: MRSA by PCR: NEGATIVE

## 2015-12-08 MED ORDER — OXYCODONE-ACETAMINOPHEN 5-325 MG PO TABS
1.0000 | ORAL_TABLET | Freq: Four times a day (QID) | ORAL | Status: DC | PRN
  Administered 2015-12-08 – 2015-12-09 (×5): 2 via ORAL
  Filled 2015-12-08 (×5): qty 2

## 2015-12-08 MED ORDER — CALCITRIOL 0.25 MCG PO CAPS
0.2500 ug | ORAL_CAPSULE | ORAL | Status: DC
  Administered 2015-12-09: 0.25 ug via ORAL
  Filled 2015-12-08: qty 1

## 2015-12-08 MED ORDER — HYDRALAZINE HCL 20 MG/ML IJ SOLN: 10.0000 mg | INTRAMUSCULAR | Status: DC | PRN

## 2015-12-08 MED ORDER — AMLODIPINE BESYLATE 5 MG PO TABS
10.0000 mg | ORAL_TABLET | Freq: Every day | ORAL | Status: DC
  Administered 2015-12-08 – 2015-12-11 (×4): 10 mg via ORAL
  Filled 2015-12-08 (×4): qty 2

## 2015-12-08 MED ORDER — SODIUM CHLORIDE 0.9 % IV SOLN
INTRAVENOUS | Status: DC
  Administered 2015-12-08: 02:00:00 via INTRAVENOUS

## 2015-12-08 MED ORDER — ASPIRIN EC 81 MG PO TBEC
81.0000 mg | DELAYED_RELEASE_TABLET | Freq: Every day | ORAL | Status: DC
  Administered 2015-12-08 – 2015-12-11 (×4): 81 mg via ORAL
  Filled 2015-12-08 (×4): qty 1

## 2015-12-08 MED ORDER — ALBUTEROL SULFATE (2.5 MG/3ML) 0.083% IN NEBU: 2.5000 mg | INHALATION_SOLUTION | RESPIRATORY_TRACT | Status: DC | PRN

## 2015-12-08 MED ORDER — DEXTROSE 5 % IV SOLN: 2.0000 g | INTRAVENOUS | Status: DC

## 2015-12-08 MED ORDER — GUAIFENESIN ER 600 MG PO TB12
1200.0000 mg | ORAL_TABLET | Freq: Two times a day (BID) | ORAL | Status: DC
  Administered 2015-12-08 – 2015-12-11 (×7): 1200 mg via ORAL
  Filled 2015-12-08 (×7): qty 2

## 2015-12-08 MED ORDER — DIPHENHYDRAMINE HCL 25 MG PO CAPS
25.0000 mg | ORAL_CAPSULE | Freq: Three times a day (TID) | ORAL | Status: DC | PRN
  Filled 2015-12-08: qty 2

## 2015-12-08 MED ORDER — MOMETASONE FURO-FORMOTEROL FUM 100-5 MCG/ACT IN AERO
INHALATION_SPRAY | RESPIRATORY_TRACT | Status: AC
  Filled 2015-12-08: qty 8.8

## 2015-12-08 MED ORDER — SODIUM CHLORIDE 0.9% FLUSH
3.0000 mL | Freq: Two times a day (BID) | INTRAVENOUS | Status: DC
  Administered 2015-12-08 – 2015-12-10 (×6): 3 mL via INTRAVENOUS

## 2015-12-08 MED ORDER — VANCOMYCIN HCL 10 G IV SOLR
2000.0000 mg | Freq: Once | INTRAVENOUS | Status: AC
  Administered 2015-12-08: 2000 mg via INTRAVENOUS
  Filled 2015-12-08: qty 2000

## 2015-12-08 MED ORDER — IPRATROPIUM-ALBUTEROL 0.5-2.5 (3) MG/3ML IN SOLN: 3.0000 mL | RESPIRATORY_TRACT | Status: DC | PRN

## 2015-12-08 MED ORDER — VITAMIN D 1000 UNITS PO TABS
5000.0000 [IU] | ORAL_TABLET | Freq: Every day | ORAL | Status: DC
Start: 2015-12-08 — End: 2015-12-11
  Administered 2015-12-08 – 2015-12-11 (×4): 5000 [IU] via ORAL
  Filled 2015-12-08 (×4): qty 5

## 2015-12-08 MED ORDER — GUAIFENESIN-DM 100-10 MG/5ML PO SYRP: 10.0000 mL | ORAL_SOLUTION | ORAL | Status: DC | PRN

## 2015-12-08 MED ORDER — VANCOMYCIN HCL IN DEXTROSE 1-5 GM/200ML-% IV SOLN
1000.0000 mg | INTRAVENOUS | Status: DC
  Administered 2015-12-09: 1000 mg via INTRAVENOUS
  Filled 2015-12-08: qty 200

## 2015-12-08 MED ORDER — CALCIUM ACETATE (PHOS BINDER) 667 MG PO CAPS
2001.0000 mg | ORAL_CAPSULE | Freq: Three times a day (TID) | ORAL | Status: DC
  Administered 2015-12-08 – 2015-12-09 (×5): 2001 mg via ORAL
  Filled 2015-12-08 (×5): qty 3

## 2015-12-08 MED ORDER — MOMETASONE FURO-FORMOTEROL FUM 100-5 MCG/ACT IN AERO
2.0000 | INHALATION_SPRAY | Freq: Two times a day (BID) | RESPIRATORY_TRACT | Status: DC
  Administered 2015-12-08 – 2015-12-11 (×6): 2 via RESPIRATORY_TRACT
  Filled 2015-12-08: qty 8.8

## 2015-12-08 MED ORDER — SODIUM CHLORIDE 0.9 % IV SOLN: 250.0000 mL | INTRAVENOUS | Status: DC | PRN

## 2015-12-08 MED ORDER — PANTOPRAZOLE SODIUM 40 MG PO TBEC
40.0000 mg | DELAYED_RELEASE_TABLET | Freq: Two times a day (BID) | ORAL | Status: DC
  Administered 2015-12-08 – 2015-12-11 (×8): 40 mg via ORAL
  Filled 2015-12-08 (×8): qty 1

## 2015-12-08 MED ORDER — PRAVASTATIN SODIUM 40 MG PO TABS
40.0000 mg | ORAL_TABLET | Freq: Every evening | ORAL | Status: DC
  Administered 2015-12-08 – 2015-12-10 (×3): 40 mg via ORAL
  Filled 2015-12-08 (×3): qty 1

## 2015-12-08 MED ORDER — ACETAMINOPHEN 325 MG PO TABS: 650.0000 mg | ORAL_TABLET | Freq: Four times a day (QID) | ORAL | Status: DC | PRN

## 2015-12-08 MED ORDER — DOXAZOSIN MESYLATE 2 MG PO TABS
2.0000 mg | ORAL_TABLET | Freq: Two times a day (BID) | ORAL | Status: DC
  Administered 2015-12-08 – 2015-12-11 (×8): 2 mg via ORAL
  Filled 2015-12-08 (×8): qty 1

## 2015-12-08 MED ORDER — VANCOMYCIN HCL 1000 MG IV SOLR
INTRAVENOUS | Status: AC
  Filled 2015-12-08: qty 2000

## 2015-12-08 MED ORDER — CEFEPIME HCL 2 G IJ SOLR
2.0000 g | INTRAMUSCULAR | Status: DC
  Administered 2015-12-09: 2 g via INTRAVENOUS
  Filled 2015-12-08 (×2): qty 2

## 2015-12-08 MED ORDER — RENA-VITE PO TABS
1.0000 | ORAL_TABLET | Freq: Every day | ORAL | Status: DC
  Administered 2015-12-10 (×2): 1 via ORAL
  Filled 2015-12-08 (×2): qty 1

## 2015-12-08 MED ORDER — DEXTROSE 5 % IV SOLN
1.0000 g | Freq: Once | INTRAVENOUS | Status: AC
  Administered 2015-12-08: 1 g via INTRAVENOUS
  Filled 2015-12-08: qty 1

## 2015-12-08 MED ORDER — CALCIUM ACETATE (PHOS BINDER) 667 MG PO CAPS: 667.0000 mg | ORAL_CAPSULE | ORAL | Status: DC | PRN

## 2015-12-08 MED ORDER — CARVEDILOL 12.5 MG PO TABS
25.0000 mg | ORAL_TABLET | Freq: Two times a day (BID) | ORAL | Status: DC
  Administered 2015-12-08 – 2015-12-11 (×7): 25 mg via ORAL
  Filled 2015-12-08 (×7): qty 2

## 2015-12-08 MED ORDER — IPRATROPIUM-ALBUTEROL 0.5-2.5 (3) MG/3ML IN SOLN
3.0000 mL | Freq: Four times a day (QID) | RESPIRATORY_TRACT | Status: DC
  Administered 2015-12-08 – 2015-12-10 (×9): 3 mL via RESPIRATORY_TRACT
  Filled 2015-12-08 (×10): qty 3

## 2015-12-08 MED ORDER — ONDANSETRON HCL 4 MG/2ML IJ SOLN
4.0000 mg | Freq: Four times a day (QID) | INTRAMUSCULAR | Status: DC | PRN
  Administered 2015-12-08 – 2015-12-09 (×3): 4 mg via INTRAVENOUS
  Filled 2015-12-08 (×3): qty 2

## 2015-12-08 MED ORDER — SODIUM CHLORIDE 0.9% FLUSH
3.0000 mL | INTRAVENOUS | Status: DC | PRN
  Administered 2015-12-08: 3 mL via INTRAVENOUS
  Filled 2015-12-08: qty 3

## 2015-12-08 NOTE — Progress Notes (Signed)
ANTIBIOTIC CONSULT NOTE-Preliminary  Pharmacy Consult for Vancomycin and Cefepime Indication: Pneumonia  Allergies  Allergen Reactions  . Codeine Nausea And Vomiting and Nausea Only    Patient Measurements: Height: 5\' 7"  (170.2 cm) Weight: 226 lb 6.6 oz (102.7 kg) IBW/kg (Calculated) : 66.1 Adjusted Body Weight: 81 Kg  Vital Signs: Temp: 101 F (38.3 C) (11/16 0129) Temp Source: Oral (11/16 0129) BP: 164/58 (11/16 0000) Pulse Rate: 86 (11/16 0000)  Labs:  Recent Labs  12/07/15 2110  WBC 7.6  HGB 10.2*  PLT 79*  CREATININE 7.22*    Estimated Creatinine Clearance: 13.5 mL/min (by C-G formula based on SCr of 7.22 mg/dL (H)).  No results for input(s): VANCOTROUGH, VANCOPEAK, VANCORANDOM, GENTTROUGH, GENTPEAK, GENTRANDOM, TOBRATROUGH, TOBRAPEAK, TOBRARND, AMIKACINPEAK, AMIKACINTROU, AMIKACIN in the last 72 hours.   Microbiology: No results found for this or any previous visit (from the past 720 hour(s)).  Medical History: Past Medical History:  Diagnosis Date  . Acute edema of lung, unspecified   . Acute, but ill-defined, cerebrovascular disease   . 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)   . Anxiety   . Asthma   . Carpal tunnel syndrome   . Cellulitis and abscess of trunk   . Cholelithiasis 07/13/2014  . Chronic headaches   . Debility, unspecified   . Dermatophytosis of the body   . Dysrhythmia    history of  . Edema   . End stage renal disease on dialysis [facility])    "MWF; [facility]" (10/21/2014)  . Essential hypertension, benign   . Gout, unspecified   . HTN (hypertension)   . Hypertrophy of prostate without urinary obstruction and other lower urinary tract symptoms (LUTS)   . Hypotension, unspecified   . Impotence of organic origin   . Insomnia, unspecified   . Kidney replaced by transplant   . Localization-related (focal) (partial) epilepsy and epileptic syndromes with complex partial seizures, without mention of intractable  epilepsy   . Lumbago   . Memory loss   . OSA on CPAP   . Other and unspecified hyperlipidemia   . Other chronic nonalcoholic liver disease   . Other malaise and fatigue   . Other nonspecific abnormal serum enzyme levels   . Pain in joint, lower leg   . Pain in joint, upper arm   . Pneumonia "several times"  . Renal dialysis status(V45.11) 02/05/2010   restarted 01/02/13 ofter renal trransplant failure  . Secondary hyperparathyroidism (of renal origin)   . Shortness of breath   . Tension headache   . Unspecified constipation   . Unspecified essential hypertension   . Unspecified hereditary and idiopathic peripheral neuropathy   . Unspecified vitamin D deficiency     Medications:  Cefepime 1 Gm IV x 1 dose in the ED  Assessment: 55 yo male seen in the ED for chest pain, SOB and cough. Pt is ESRD on dialysis; last on Wednesday 12/07/15. Chest CT shows pneumonia. Empiric antibiotics for HCAP.  Goal of Therapy:  Vancomycin levels per hemodialysis protocol  Plan:  Preliminary review of pertinent patient information completed.  Protocol will be initiated with a one-time dose of Vancomycin 2000 mg IV.  Forestine Na clinical pharmacist will complete review during morning rounds to assess patient and finalize treatment regimen.  Norberto Sorenson, [facility] 12/08/2015,1:47 AM

## 2015-12-08 NOTE — Consult Note (Signed)
Reason for Consult: End-stage renal disease Referring Physician: Dr. Bonnetta Barry is an 55 y.o. male.  HPI: He is a patient who has history of hypertension, sleep apnea, end-stage renal disease on maintenance hemodialysis recently came with complaints of fever, chills, cough with sputum production for the last 3 days. According the patient is started on Monday but continued to get worse. He went to dialysis yesterday and completed his treatment. Still since she continued to have cough with sputum production he decided to come to the emergency room where he was found to have pneumonia and admitted for further treatment. Presently still complains of cough. He has fever last night but no chills. He denies any nausea or vomiting. Diarrhea has improved. Complains of some weakness.  Past Medical History:  Diagnosis Date  . Acute edema of lung, unspecified   . Acute, but ill-defined, cerebrovascular disease   . 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)   . Anxiety   . Asthma   . Carpal tunnel syndrome   . Cellulitis and abscess of trunk   . Cholelithiasis 07/13/2014  . Chronic headaches   . Debility, unspecified   . Dermatophytosis of the body   . Dysrhythmia    history of  . Edema   . End stage renal disease on dialysis [facility])    "MWF; [facility]" (10/21/2014)  . Essential hypertension, benign   . Gout, unspecified   . HTN (hypertension)   . Hypertrophy of prostate without urinary obstruction and other lower urinary tract symptoms (LUTS)   . Hypotension, unspecified   . Impotence of organic origin   . Insomnia, unspecified   . Kidney replaced by transplant   . Localization-related (focal) (partial) epilepsy and epileptic syndromes with complex partial seizures, without mention of intractable epilepsy   . Lumbago   . Memory loss   . OSA on CPAP   . Other and unspecified hyperlipidemia   . Other chronic nonalcoholic liver disease   . Other malaise and fatigue    . Other nonspecific abnormal serum enzyme levels   . Pain in joint, lower leg   . Pain in joint, upper arm   . Pneumonia "several times"  . Renal dialysis status(V45.11) 02/05/2010   restarted 01/02/13 ofter renal trransplant failure  . Secondary hyperparathyroidism (of renal origin)   . Shortness of breath   . Tension headache   . Unspecified constipation   . Unspecified essential hypertension   . Unspecified hereditary and idiopathic peripheral neuropathy   . Unspecified vitamin D deficiency     Past Surgical History:  Procedure Laterality Date  . AV FISTULA PLACEMENT Left ?2010   "forearm; at [facility]"  . BACK SURGERY    . CARDIAC CATHETERIZATION  03/21/2011  . CHOLECYSTECTOMY N/A 10/21/2014   Procedure: LAPAROSCOPIC CHOLECYSTECTOMY WITH INTRAOPERATIVE CHOLANGIOGRAM;  Surgeon: Autumn Messing III, MD;  Location: [place];  Service: General;  Laterality: N/A;  . COLONOSCOPY    . 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 KIDNEY TRANSPLANT  08/17/2011   [place]  10/21/2014   w/IOC  . LEFT HEART CATHETERIZATION WITH CORONARY ANGIOGRAM N/A 03/21/2011   Procedure: LEFT HEART CATHETERIZATION WITH CORONARY ANGIOGRAM;  Surgeon: [name], MD;  Location: [facility] CATH LAB;  Service: Cardiovascular;  Laterality: N/A;  . NEPHRECTOMY  08/2013   removed transplaned kidney  . POSTERIOR FUSION CERVICAL SPINE  06/25/2012   for spinal stenosis  .  VASECTOMY  2010    Family History  Problem Relation Age of Onset  . Adopted: Yes  . Colon cancer Neg Hx   . Esophageal cancer Neg Hx   . Rectal cancer Neg Hx   . Stomach cancer Neg Hx     Social History:  reports that he quit smoking about 15 months ago. His smoking use included Cigarettes. He has a 16.00 pack-year smoking history. He has never used smokeless tobacco. He reports that he does not drink alcohol or use drugs.  Allergies:  Allergies  Allergen Reactions  . Codeine Nausea And  Vomiting and Nausea Only    Medications: I have reviewed the patient's current medications.  Results for orders placed or performed during the hospital encounter of 12/07/15 (from the past 48 hour(s))  Basic metabolic panel     Status: Abnormal   Collection Time: 12/07/15  9:10 PM  Result Value Ref Range   Sodium 140 135 - 145 mmol/L   Potassium 4.2 3.5 - 5.1 mmol/L   Chloride 99 (L) 101 - 111 mmol/L   CO2 31 22 - 32 mmol/L   Glucose, Bld 122 (H) 65 - 99 mg/dL   BUN 18 6 - 20 mg/dL   Creatinine, Ser 7.22 (H) 0.61 - 1.24 mg/dL   Calcium 8.9 8.9 - 10.3 mg/dL   GFR calc non Af Amer 8 (L) >60 mL/min   GFR calc Af Amer 9 (L) >60 mL/min    Comment: (NOTE) The eGFR has been calculated using the CKD EPI equation. This calculation has not been validated in all clinical situations. eGFR's persistently <60 mL/min signify possible Chronic Kidney Disease.    Anion gap 10 5 - 15  CBC     Status: Abnormal   Collection Time: 12/07/15  9:10 PM  Result Value Ref Range   WBC 7.6 4.0 - 10.5 K/uL   RBC 3.19 (L) 4.22 - 5.81 MIL/uL   Hemoglobin 10.2 (L) 13.0 - 17.0 g/dL   HCT 31.5 (L) 39.0 - 52.0 %   MCV 98.7 78.0 - 100.0 fL   MCH 32.0 26.0 - 34.0 pg   MCHC 32.4 30.0 - 36.0 g/dL   RDW 14.0 11.5 - 15.5 %   Platelets 79 (L) 150 - 400 K/uL    Comment: REPEATED TO VERIFY SPECIMEN CHECKED FOR CLOTS PLATELETS APPEAR DECREASED SMEAR STAINED AND AVAILABLE FOR REVIEW   Troponin I     Status: None   Collection Time: 12/07/15  9:10 PM  Result Value Ref Range   Troponin I <0.03 <0.03 ng/mL  I-stat troponin, ED     Status: None   Collection Time: 12/07/15  9:24 PM  Result Value Ref Range   Troponin i, poc 0.00 0.00 - 0.08 ng/mL   Comment 3            Comment: Due to the release kinetics of cTnI, a negative result within the first hours of the onset of symptoms does not rule out myocardial infarction with certainty. If myocardial infarction is still suspected, repeat the test at appropriate  intervals.   Culture, blood (Routine X 2) w Reflex to ID Panel     Status: None (Preliminary result)   Collection Time: 12/08/15 12:21 AM  Result Value Ref Range   Specimen Description BLOOD RIGHT ANTECUBITAL    Special Requests BOTTLES DRAWN AEROBIC AND ANAEROBIC 6CC EACH    Culture NO GROWTH < 12 HOURS    Report Status PENDING   Culture, blood (Routine X  2) w Reflex to ID Panel     Status: None (Preliminary result)   Collection Time: 12/08/15 12:28 AM  Result Value Ref Range   Specimen Description BLOOD RIGHT HAND    Special Requests      BOTTLES DRAWN AEROBIC AND ANAEROBIC AEB 6CC ANA 4CC   Culture NO GROWTH < 12 HOURS    Report Status PENDING   MRSA PCR Screening     Status: None   Collection Time: 12/08/15  1:22 AM  Result Value Ref Range   MRSA by PCR NEGATIVE NEGATIVE    Comment:        The GeneXpert MRSA Assay (FDA approved for NASAL specimens only), is one component of a comprehensive MRSA colonization surveillance program. It is not intended to diagnose MRSA infection nor to guide or monitor treatment for MRSA infections.     Dg Chest 2 View  Result Date: 12/07/2015 CLINICAL DATA:  Productive cough and fever for 2 days. EXAM: CHEST  2 VIEW COMPARISON:  05/17/2015 FINDINGS: Mild interstitial coarsening, possibly due to an interstitial infiltrate as it does appear new from 04/25 17. No airspace consolidation. No effusions. Hilar and mediastinal contours are normal and unchanged. IMPRESSION: Mild interstitial coarsening.  No consolidation or effusion. Electronically Signed   By: Ellery Plunk M.D.   On: 12/07/2015 22:20   Ct Chest Wo Contrast  Result Date: 12/07/2015 CLINICAL DATA:  Intermittent substernal chest pain onset yesterday. Developed dyspnea and cough 2 nights ago. EXAM: CT CHEST WITHOUT CONTRAST TECHNIQUE: Multidetector CT imaging of the chest was performed following the standard protocol without IV contrast. COMPARISON:  CXR 12/07/2015, chest CT  05/17/2015 FINDINGS: Cardiovascular: The heart is top-normal in size with trace pericardial fluid. There is coronary arteriosclerosis along the LAD and minimally in the RCA. No aortic aneurysm. There is aortic atherosclerosis. Mediastinum/Nodes: No axillary, supraclavicular or mediastinal lymphadenopathy. Small paratracheal lymph nodes are noted up to 6 mm on the left and 9 mm on the right. There is a 15 mm subcarinal short axis lymph node. These findings may be reactive in etiology. The esophagus and trachea are unremarkable. Lungs/Pleura: Bilateral lower lobe pneumonic consolidations are seen left greater than right with air bronchograms on the left. Tree-in-bud densities consistent with bronchitic change are also seen in the lower lobes and lingula. No effusion or pneumothorax. Upper Abdomen: Cholecystectomy. No acute upper abdominal abnormality. Musculoskeletal: No acute nor suspicious osseous lesions. IMPRESSION: Bilateral left greater than right lower lobe pneumonic consolidations with bronchitic change in both lower lobes and lingula. Mild reactive mediastinal lymphadenopathy. Aortic atherosclerosis.  Coronary arteriosclerosis. Electronically Signed   By: Tollie Eth M.D.   On: 12/07/2015 23:58    Review of Systems  Constitutional: Positive for chills and fever.  Respiratory: Positive for cough, sputum production, shortness of breath and wheezing.   Cardiovascular: Positive for chest pain. Negative for orthopnea.  Gastrointestinal: Positive for diarrhea. Negative for nausea and vomiting.   Blood pressure (!) 164/84, pulse 79, temperature 99.1 F (37.3 C), temperature source Oral, resp. rate (!) 22, height 5\' 7"  (1.702 m), weight 102.7 kg (226 lb 6.6 oz), SpO2 94 %. Physical Exam  Constitutional: No distress.  Eyes: No scleral icterus.  Neck: No JVD present.  Cardiovascular: Normal rate and regular rhythm.   Respiratory: No respiratory distress. He has wheezes. He has no rales.  GI: There is  no tenderness.  Musculoskeletal: He exhibits edema.    Assessment/Plan: Problem #1 bilateral pneumonia left greater than right. Presently is  on antibiotics. Still complains of cough and sputum production. Problem #2 hypertension: His blood pressure slightly high but overall controlled Problem #3 end-stage renal disease: His status post hemodialysis yesterday. Presently he doesn't have any uremic signs and symptoms. Problem #4 anemia: His hemoglobin is within our target goal Problem #5 history of sleep apnea Problem #6 metabolic bone disease: His calcium is range Problem #7 history of gout Plan: We'll make arrangement for patient to get dialysis tomorrow which is his regular schedule. We'll dialyze him for 4 1/4 hours  minutes and try to remove about 53/0 L if his systolic blood pressure is stable. We'll check his renal panel and CBC in the morning.  Delshawn Stech S 12/08/2015, 9:04 AM

## 2015-12-08 NOTE — Progress Notes (Signed)
Patient admitted to the hospital overnight by Dr. Myna Hidalgo  Patient seen and examined. Currently on CPAP. Reports that he does have a productive cough. Feels better than he did on admission. Chest exam shows scattered rhonchi, no peripheral edema.  55 y/o with ESRD on HD, admitted with HCAP. Started on broad spectrum antibiotics. Continue to monitor fever curve. Follow up cultures. Continue pulmonary hygiene. He is hemodynamically stable.  Kirk [name]

## 2015-12-08 NOTE — Care Management Obs Status (Signed)
[place] NOTIFICATION   Patient Details  Name: Kirk [name] MRN: [id number] Date of Birth: 03/15/1960   Medicare Observation Status Notification Given:  Yes    Sherald Barge, RN 12/08/2015, 4:26 PM

## 2015-12-08 NOTE — H&P (Signed)
History and Physical    Kirk [name] HFW:[id number] DOB: [birth year] DOA: 12/07/2015  PCP: Jeanmarie Hubert, MD   Patient coming from: Home  Chief Complaint: Fever, chills, pleuritic pain, productive cough, dyspnea   HPI: Kirk [name] is a 55 y.o. male with medical history significant for end-stage renal disease on hemodialysis, hypertension, chronic anemia and thrombocytopenia, and GERD who presents to the emergency department for evaluation of fevers, chills, malaise, productive cough, and dyspnea. Patient reports that he had been in his usual state of health until approximately 12/03/2015 when he developed copious thin rhinorrhea and irritation with clear discharge involving the right eye. By the next day, he had developed a cough, and then dyspnea with exertion worsened over the ensuing days leading up to admission. He has also developed pain in the central chest with cough or deep inspiration. His cough is productive of thick clear and white sputum and he is now dyspneic with minimal exertion. He had attempted treating his symptoms with Robitussin and Tylenol, but has continued to worsen. He hoped that his breathing would improve after dialysis on 12/07/2015, but if anything, he reports feeling worse after dialysis and so he came into the ED for evaluation. He denies recent long distance travel and denies sick contacts. He reports that his symptoms are reminiscent of his prior experience with pneumonia.   ED Course: Upon arrival to the ED, patient is found to be afebrile initially, saturating adequately on room air, tachypneic, mildly hypertensive, and with normal heart rate. EKG demonstrated a normal sinus rhythm and chest x-ray was notable for mild interstitial coarsening. Chemistry panel was largely unremarkable and CBC was notable for a stable normocytic anemia with hemoglobin of 10.2 and a worsened thrombocytopenia with platelet count 79,000. Troponin was undetectable, and still undetectable  on repeat measurement. There was rhonchi appreciated on auscultation of the patient's lungs and a CT of the chest was obtained. Chest CT featured bilateral, left greater than right, lower lobe pneumonias. Blood cultures were obtained and the patient was started on empiric treatment with vancomycin and cefepime. He later became febrile to 38.3 C and is treated with acetaminophen. He has remained hemodynamically stable and will be observed on the telemetry unit for ongoing evaluation and management of pleuritic chest pain, fevers, cough, and dyspnea suspected secondary to HCAP, likely preceded by an acute viral URI.   Review of Systems:  All other systems reviewed and apart from HPI, are negative.  Past Medical History:  Diagnosis Date  . Acute edema of lung, unspecified   . Acute, but ill-defined, cerebrovascular disease   . 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)   . Anxiety   . Asthma   . Carpal tunnel syndrome   . Cellulitis and abscess of trunk   . Cholelithiasis 07/13/2014  . Chronic headaches   . Debility, unspecified   . Dermatophytosis of the body   . Dysrhythmia    history of  . Edema   . End stage renal disease on dialysis [facility])    "MWF; [facility]" (10/21/2014)  . Essential hypertension, benign   . Gout, unspecified   . HTN (hypertension)   . Hypertrophy of prostate without urinary obstruction and other lower urinary tract symptoms (LUTS)   . Hypotension, unspecified   . Impotence of organic origin   . Insomnia, unspecified   . Kidney replaced by transplant   . Localization-related (focal) (partial) epilepsy and epileptic syndromes with complex partial seizures, without mention of intractable epilepsy   .  Lumbago   . Memory loss   . OSA on CPAP   . Other and unspecified hyperlipidemia   . Other chronic nonalcoholic liver disease   . Other malaise and fatigue   . Other nonspecific abnormal serum enzyme levels   . Pain in joint, lower leg   . Pain  in joint, upper arm   . Pneumonia "several times"  . Renal dialysis status(V45.11) 02/05/2010   restarted 01/02/13 ofter renal trransplant failure  . Secondary hyperparathyroidism (of renal origin)   . Shortness of breath   . Tension headache   . Unspecified constipation   . Unspecified essential hypertension   . Unspecified hereditary and idiopathic peripheral neuropathy   . Unspecified vitamin D deficiency     Past Surgical History:  Procedure Laterality Date  . AV FISTULA PLACEMENT Left ?2010   "forearm; at [facility]"  . BACK SURGERY    . CARDIAC CATHETERIZATION  03/21/2011  . CHOLECYSTECTOMY N/A 10/21/2014   Procedure: LAPAROSCOPIC CHOLECYSTECTOMY WITH INTRAOPERATIVE CHOLANGIOGRAM;  Surgeon: Autumn Messing III, MD;  Location: [place];  Service: General;  Laterality: N/A;  . COLONOSCOPY    . 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 KIDNEY TRANSPLANT  08/17/2011   [place]  10/21/2014   w/IOC  . LEFT HEART CATHETERIZATION WITH CORONARY ANGIOGRAM N/A 03/21/2011   Procedure: LEFT HEART CATHETERIZATION WITH CORONARY ANGIOGRAM;  Surgeon: [name], MD;  Location: [facility] CATH LAB;  Service: Cardiovascular;  Laterality: N/A;  . NEPHRECTOMY  08/2013   removed transplaned kidney  . POSTERIOR FUSION CERVICAL SPINE  06/25/2012   for spinal stenosis  . VASECTOMY  2010     reports that he quit smoking about 15 months ago. His smoking use included Cigarettes. He has a 16.00 pack-year smoking history. He has never used smokeless tobacco. He reports that he does not drink alcohol or use drugs.  Allergies  Allergen Reactions  . Codeine Nausea And Vomiting and Nausea Only    Family History  Problem Relation Age of Onset  . Adopted: Yes  . Colon cancer Neg Hx   . Esophageal cancer Neg Hx   . Rectal cancer Neg Hx   . Stomach cancer Neg Hx      Prior to Admission medications   Medication Sig Start Date End Date Taking?  Authorizing Provider  acetaminophen (TYLENOL) 500 MG tablet Take 1 tablet (500 mg total) by mouth every 6 (six) hours as needed for moderate pain. 09/12/14   Samuella Cota, MD  amLODipine (NORVASC) 10 MG tablet Take 1 tablet (10 mg total) by mouth daily. 02/08/15   Estill Dooms, MD  aspirin EC 81 MG tablet Take 81 mg by mouth daily.    Historical Provider, MD  b complex-vitamin c-folic acid (NEPHRO-VITE) 0.8 MG TABS tablet Take 1 tablet by mouth daily. 02/08/15   Estill Dooms, MD  budesonide-formoterol (SYMBICORT) 80-4.5 MCG/ACT inhaler Inhale 2 puffs into the lungs 2 (two) times daily. Patient taking differently: Inhale 2 puffs into the lungs 2 (two) times daily as needed.  09/21/14   Bonnielee Haff, MD  calcitRIOL (ROCALTROL) 0.25 MCG capsule Take 1 capsule (0.25 mcg total) by mouth every Monday, Wednesday, and Friday with hemodialysis. 09/21/14   Bonnielee Haff, MD  calcium acetate, Phos Binder, (PHOSLYRA) 667 MG/5ML SOLN Take 2,001 mg by mouth 3 (three) times daily with meals. And 667 mg with snack    Historical Provider, MD  carvedilol (COREG) 25 MG tablet Take 1 tablet (25 mg total) by mouth 2 (two) times daily. Take one tablet in the morning and one tablet in the evening 02/08/15   Estill Dooms, MD  Cholecalciferol (VITAMIN D3) 5000 UNITS TABS Take 1 tablet by mouth daily.    Historical Provider, MD  diphenhydrAMINE (BENADRYL) 25 MG tablet Take 50 mg by mouth 2 (two) times daily.     Historical Provider, MD  doxazosin (CARDURA) 4 MG tablet Take 0.5 tablet  by mouth in the am and 0.5 tablet by mouth in the pm to regulate blood pressure. 02/08/15   Estill Dooms, MD  guaiFENesin-dextromethorphan (ROBITUSSIN DM) 100-10 MG/5ML syrup Take 10 mLs by mouth every 4 (four) hours as needed for cough.    Historical Provider, MD  omeprazole (PRILOSEC) 20 MG capsule TAKE 1 CAPSULE (20 MG TOTAL) BY MOUTH 2 (TWO) TIMES DAILY BEFORE A MEAL. 12/01/15   Levin Erp, PA  oxyCODONE-acetaminophen  (PERCOCET/ROXICET) 5-325 MG tablet Take 1-2 tablets by mouth every 8 (eight) hours as needed for severe pain. 02/08/15   Estill Dooms, MD  pravastatin (PRAVACHOL) 40 MG tablet Take 1 tablet (40 mg total) by mouth every evening. 02/08/15   Estill Dooms, MD  promethazine (PHENERGAN) 25 MG tablet Take 1 tablet (25 mg total) by mouth every 8 (eight) hours as needed for nausea or vomiting. 07/13/14   Estill Dooms, MD  traMADol Veatrice Bourbon) 50 MG tablet One up to 4 times daly if needed for pain 05/10/15   Estill Dooms, MD    Physical Exam: Vitals:   12/07/15 2230 12/07/15 2245 12/08/15 0000 12/08/15 0129  BP: 171/72  164/58   Pulse: 86 86 86   Resp: 26 26 (!) 28   Temp:    (!) 101 F (38.3 C)  TempSrc:    Oral  SpO2: 92% 92% 91%   Weight:    102.7 kg (226 lb 6.6 oz)  Height:    5\' 7"  (1.702 m)      Constitutional: No acute respiratory distress, appears uncomfortable  Eyes: PERTLA, lids and conjunctivae normal ENMT: Mucous membranes are moist. Posterior pharynx clear of any exudate or lesions.   Neck: normal, supple, no masses, no thyromegaly Respiratory: Rhonchi at bilateral bases and mid-lung zones. Mild tachypnea and mild increase in WOB. No pallor. Cardiovascular: S1 & S2 heard, regular rate and rhythm. No significant JVD. Abdomen: No distension, no tenderness, no masses palpated. Bowel sounds normal.  Musculoskeletal: no clubbing / cyanosis. No joint deformity upper and lower extremities. Normal muscle tone.  Skin: no significant rashes, lesions, ulcers. Warm, dry, well-perfused. Neurologic: CN 2-12 grossly intact. Sensation intact, DTR normal. Strength 5/5 in all 4 limbs.  Psychiatric: Normal judgment and insight. Alert and oriented x 3. Normal mood and affect.     Labs on Admission: I have personally reviewed following labs and imaging studies  CBC:  Recent Labs Lab 12/07/15 2110  WBC 7.6  HGB 10.2*  HCT 31.5*  MCV 98.7  PLT 79*   Basic Metabolic Panel:  Recent  Labs Lab 12/07/15 2110  NA 140  K 4.2  CL 99*  CO2 31  GLUCOSE 122*  BUN 18  CREATININE 7.22*  CALCIUM 8.9   GFR: Estimated Creatinine Clearance: 13.5 mL/min (by C-G formula based on SCr of 7.22 mg/dL (H)). Liver Function Tests: No results for input(s): AST, ALT, ALKPHOS, BILITOT, PROT, ALBUMIN in the last 168 hours. No results for input(s): LIPASE,  AMYLASE in the last 168 hours. No results for input(s): AMMONIA in the last 168 hours. Coagulation Profile: No results for input(s): INR, PROTIME in the last 168 hours. Cardiac Enzymes:  Recent Labs Lab 12/07/15 2110  TROPONINI <0.03   BNP (last 3 results) No results for input(s): PROBNP in the last 8760 hours. HbA1C: No results for input(s): HGBA1C in the last 72 hours. CBG: No results for input(s): GLUCAP in the last 168 hours. Lipid Profile: No results for input(s): CHOL, HDL, LDLCALC, TRIG, CHOLHDL, LDLDIRECT in the last 72 hours. Thyroid Function Tests: No results for input(s): TSH, T4TOTAL, FREET4, T3FREE, THYROIDAB in the last 72 hours. Anemia Panel: No results for input(s): VITAMINB12, FOLATE, FERRITIN, TIBC, IRON, RETICCTPCT in the last 72 hours. Urine analysis:    Component Value Date/Time   COLORURINE YELLOW 04/06/2014 1030   APPEARANCEUR CLEAR 04/06/2014 1030   LABSPEC 1.020 04/06/2014 1030   PHURINE 8.0 04/06/2014 1030   GLUCOSEU 100 (A) 04/06/2014 1030   HGBUR MODERATE (A) 04/06/2014 1030   BILIRUBINUR NEGATIVE 04/06/2014 1030   KETONESUR NEGATIVE 04/06/2014 1030   PROTEINUR >300 (A) 04/06/2014 1030   UROBILINOGEN 0.2 04/06/2014 1030   NITRITE NEGATIVE 04/06/2014 1030   LEUKOCYTESUR NEGATIVE 04/06/2014 1030   Sepsis Labs: @LABRCNTIP (procalcitonin:4,lacticidven:4) )No results found for this or any previous visit (from the past 240 hour(s)).   Radiological Exams on Admission: Dg Chest 2 View  Result Date: 12/07/2015 CLINICAL DATA:  Productive cough and fever for 2 days. EXAM: CHEST  2 VIEW  COMPARISON:  05/17/2015 FINDINGS: Mild interstitial coarsening, possibly due to an interstitial infiltrate as it does appear new from 04/25 17. No airspace consolidation. No effusions. Hilar and mediastinal contours are normal and unchanged. IMPRESSION: Mild interstitial coarsening.  No consolidation or effusion. Electronically Signed   By: Andreas Newport M.D.   On: 12/07/2015 22:20   Ct Chest Wo Contrast  Result Date: 12/07/2015 CLINICAL DATA:  Intermittent substernal chest pain onset yesterday. Developed dyspnea and cough 2 nights ago. EXAM: CT CHEST WITHOUT CONTRAST TECHNIQUE: Multidetector CT imaging of the chest was performed following the standard protocol without IV contrast. COMPARISON:  CXR 12/07/2015, chest CT 05/17/2015 FINDINGS: Cardiovascular: The heart is top-normal in size with trace pericardial fluid. There is coronary arteriosclerosis along the LAD and minimally in the RCA. No aortic aneurysm. There is aortic atherosclerosis. Mediastinum/Nodes: No axillary, supraclavicular or mediastinal lymphadenopathy. Small paratracheal lymph nodes are noted up to 6 mm on the left and 9 mm on the right. There is a 15 mm subcarinal short axis lymph node. These findings may be reactive in etiology. The esophagus and trachea are unremarkable. Lungs/Pleura: Bilateral lower lobe pneumonic consolidations are seen left greater than right with air bronchograms on the left. Tree-in-bud densities consistent with bronchitic change are also seen in the lower lobes and lingula. No effusion or pneumothorax. Upper Abdomen: Cholecystectomy. No acute upper abdominal abnormality. Musculoskeletal: No acute nor suspicious osseous lesions. IMPRESSION: Bilateral left greater than right lower lobe pneumonic consolidations with bronchitic change in both lower lobes and lingula. Mild reactive mediastinal lymphadenopathy. Aortic atherosclerosis.  Coronary arteriosclerosis. Electronically Signed   By: Ashley Royalty M.D.   On:  12/07/2015 23:58    EKG: Independently reviewed. Normal sinus rhythm   Assessment/Plan  1. HCAP  - Pt presents with productive cough, dyspnea, fevers, and pleuritic pain  - CT chest reveals bibasilar PNA  - Sxs began with rhinorrhea and conjunctivitis, will check a respiratory virus panel and exercise droplet precautions while  awaiting results  - Blood cultures are incubating, sputum culture and gram-stain requested; will check urine for strep pneumo antigens   - Continue empiric treatment with vancomycin and cefepime while awaiting culture data and following clinical response  - Supportive care with antipyretics    2. ESRD - Managed with HD on MWF schedule - Pt reports completing HD on 11/15 without incident  - There are no significant electrolyte derangements on admission and no significant volume excess  - If pt remains hospitalized on 11/17, may need to contact nephrology for maintenance HD  - Renal diet with fluid-restrictions, SLIV    3. Normocytic anemia, thrombocytopenia  - Hgb is 10.2 on admission and stable  - Platelet count is 79,000 on admission, worse than priors, likely d/t the acute infection which will be treated as above  - No s/s of bleeding on admission; will avoid pharmacologic VTE ppx for now   4. Hypertension  - BP is elevated on admission  - Continue Norvasc, Coreg, and doxazosin - If BP remains significantly elevated after his evening medications are give, will be treated with hydralazine IVPs prn   5. OSA - Continue CPAP qHS     DVT prophylaxis: SCDs Code Status: Full  Family Communication: Wife updated at bedside Disposition Plan: Observe on telemetry Consults called: None Admission status: Observation    Vianne Bulls, MD [facility] Pager [phone]  If 7PM-7AM, please contact night-coverage [email or web address] Password [email or web address]  12/08/2015, 1:45 AM

## 2015-12-08 NOTE — Progress Notes (Signed)
Pharmacy Antibiotic Note  Kirk [name] is a 55 y.o. male admitted on 12/07/2015 with pneumonia.  Pharmacy has been consulted for Vancomycin and Cefepime dosing.  Plan: Vancomycin 1gm IV every HD (MWF). Cefepime 2gm IV every HD (MWF). Monitor labs, micro and vitals.  Pre-Hemodialysis Vancomycin level goal range =15-25 mcg/ml   Height: 5\' 7"  (170.2 cm) Weight: 226 lb 6.6 oz (102.7 kg) IBW/kg (Calculated) : 66.1  Temp (24hrs), Avg:99.4 F (37.4 C), Min:98.6 F (37 C), Max:101 F (38.3 C)   Recent Labs Lab 12/07/15 2110  WBC 7.6  CREATININE 7.22*    Estimated Creatinine Clearance: 13.5 mL/min (by C-G formula based on SCr of 7.22 mg/dL (H)).    Allergies  Allergen Reactions  . Codeine Nausea And Vomiting and Nausea Only   Antimicrobials this admission:  Vanc 11/16 >>  Cefepime 11/16 >>   Dose adjustments this admission:  ESRD dosing  Microbiology results:  11/16 BCx:  11/16 Resp:   11/16 MRSA PCR: (-)  Thank you for allowing pharmacy to be a part of this patient's care.  Pricilla Larsson 12/08/2015 9:39 AM

## 2015-12-09 DIAGNOSIS — J9601 Acute respiratory failure with hypoxia: Secondary | ICD-10-CM

## 2015-12-09 DIAGNOSIS — E1121 Type 2 diabetes mellitus with diabetic nephropathy: Secondary | ICD-10-CM

## 2015-12-09 LAB — CBC
HCT: 27.8 % — ABNORMAL LOW (ref 39.0–52.0)
HCT: 29.2 % — ABNORMAL LOW (ref 39.0–52.0)
HEMOGLOBIN: 8.8 g/dL — AB (ref 13.0–17.0)
Hemoglobin: 9.4 g/dL — ABNORMAL LOW (ref 13.0–17.0)
MCH: 32.1 pg (ref 26.0–34.0)
MCH: 32 pg (ref 26.0–34.0)
MCHC: 31.7 g/dL (ref 30.0–36.0)
MCHC: 32.2 g/dL (ref 30.0–36.0)
MCV: 101.5 fL — AB (ref 78.0–100.0)
MCV: 99.3 fL (ref 78.0–100.0)
PLATELETS: 70 10*3/uL — AB (ref 150–400)
Platelets: 69 10*3/uL — ABNORMAL LOW (ref 150–400)
RBC: 2.74 MIL/uL — AB (ref 4.22–5.81)
RBC: 2.94 MIL/uL — AB (ref 4.22–5.81)
RDW: 13.2 % (ref 11.5–15.5)
RDW: 14.1 % (ref 11.5–15.5)
WBC: 5.3 10*3/uL (ref 4.0–10.5)
WBC: 6.2 10*3/uL (ref 4.0–10.5)

## 2015-12-09 LAB — RENAL FUNCTION PANEL
ANION GAP: 10 (ref 5–15)
Albumin: 3.4 g/dL — ABNORMAL LOW (ref 3.5–5.0)
Albumin: 3.7 g/dL (ref 3.5–5.0)
Anion gap: 12 (ref 5–15)
BUN: 35 mg/dL — ABNORMAL HIGH (ref 6–20)
BUN: 38 mg/dL — AB (ref 6–20)
CHLORIDE: 97 mmol/L — AB (ref 101–111)
CHLORIDE: 96 mmol/L — AB (ref 101–111)
CO2: 30 mmol/L (ref 22–32)
CO2: 28 mmol/L (ref 22–32)
CREATININE: 13.3 mg/dL — AB (ref 0.61–1.24)
Calcium: 9.1 mg/dL (ref 8.9–10.3)
Calcium: 9.1 mg/dL (ref 8.9–10.3)
Creatinine, Ser: 12.08 mg/dL — ABNORMAL HIGH (ref 0.61–1.24)
GFR calc non Af Amer: 4 mL/min — ABNORMAL LOW (ref >60)
GFR, EST AFRICAN AMERICAN: 5 mL/min — AB (ref >60)
GFR, EST AFRICAN AMERICAN: 4 mL/min — AB (ref >60)
GFR, EST NON AFRICAN AMERICAN: 4 mL/min — AB (ref >60)
GLUCOSE: 133 mg/dL — AB (ref 65–99)
Glucose, Bld: 114 mg/dL — ABNORMAL HIGH (ref 65–99)
POTASSIUM: 4.6 mmol/L (ref 3.5–5.1)
Phosphorus: 5.2 mg/dL — ABNORMAL HIGH (ref 2.5–4.6)
Phosphorus: 5.1 mg/dL — ABNORMAL HIGH (ref 2.5–4.6)
Potassium: 4.5 mmol/L (ref 3.5–5.1)
Sodium: 137 mmol/L (ref 135–145)
Sodium: 136 mmol/L (ref 135–145)

## 2015-12-09 LAB — EXPECTORATED SPUTUM ASSESSMENT W GRAM STAIN, RFLX TO RESP C

## 2015-12-09 LAB — HIV ANTIBODY (ROUTINE TESTING W REFLEX): HIV SCREEN 4TH GENERATION: NONREACTIVE

## 2015-12-09 LAB — STREP PNEUMONIAE URINARY ANTIGEN: Strep Pneumo Urinary Antigen: NEGATIVE

## 2015-12-09 MED ORDER — EPOETIN ALFA 4000 UNIT/ML IJ SOLN
INTRAMUSCULAR | Status: AC
  Administered 2015-12-09: 4000 [IU]
  Filled 2015-12-09: qty 1

## 2015-12-09 MED ORDER — PROMETHAZINE HCL 25 MG/ML IJ SOLN
12.5000 mg | Freq: Four times a day (QID) | INTRAMUSCULAR | Status: DC | PRN
  Administered 2015-12-09 (×2): 12.5 mg via INTRAVENOUS
  Filled 2015-12-09 (×2): qty 1

## 2015-12-09 MED ORDER — ALTEPLASE 2 MG IJ SOLR
2.0000 mg | Freq: Once | INTRAMUSCULAR | Status: DC | PRN
  Filled 2015-12-09: qty 2

## 2015-12-09 MED ORDER — LIDOCAINE-PRILOCAINE 2.5-2.5 % EX CREA
1.0000 "application " | TOPICAL_CREAM | CUTANEOUS | Status: DC | PRN
  Filled 2015-12-09: qty 5

## 2015-12-09 MED ORDER — LIDOCAINE HCL (PF) 1 % IJ SOLN: 5.0000 mL | INTRAMUSCULAR | Status: DC | PRN

## 2015-12-09 MED ORDER — SODIUM CHLORIDE 0.9 % IV SOLN: 100.0000 mL | INTRAVENOUS | Status: DC | PRN

## 2015-12-09 MED ORDER — HEPARIN SODIUM (PORCINE) 1000 UNIT/ML DIALYSIS
1000.0000 [IU] | INTRAMUSCULAR | Status: DC | PRN
  Filled 2015-12-09: qty 1

## 2015-12-09 MED ORDER — METHYLPREDNISOLONE SODIUM SUCC 125 MG IJ SOLR
60.0000 mg | Freq: Four times a day (QID) | INTRAMUSCULAR | Status: DC
Start: 2015-12-09 — End: 2015-12-10
  Administered 2015-12-09 – 2015-12-10 (×6): 60 mg via INTRAVENOUS
  Filled 2015-12-09 (×6): qty 2

## 2015-12-09 MED ORDER — EPOETIN ALFA 10000 UNIT/ML IJ SOLN
INTRAMUSCULAR | Status: AC
  Administered 2015-12-09: 10000 [IU]
  Filled 2015-12-09: qty 1

## 2015-12-09 MED ORDER — PENTAFLUOROPROP-TETRAFLUOROETH EX AERO
1.0000 "application " | INHALATION_SPRAY | CUTANEOUS | Status: DC | PRN
  Filled 2015-12-09: qty 30

## 2015-12-09 MED ORDER — EPOETIN ALFA 20000 UNIT/ML IJ SOLN
14000.0000 [IU] | INTRAMUSCULAR | Status: DC
  Filled 2015-12-09 (×2): qty 1

## 2015-12-09 NOTE — Progress Notes (Signed)
PROGRESS NOTE    Kirk [name]  IFO:[id number] DOB: [birth year] DOA: 12/07/2015 PCP: Jeanmarie Hubert, MD    Brief Narrative:  89 yom with a history of end-stage renal disease on hemodialysis, HTN, thrombocytopenia, anemia, GERD, anxiety, and OSA, presented with complaints of fever, chills, pleuritic chest pain, productive cough, and dyspnea. While in the ED, he was found to be tachypenic, and hypertensive. Imaging indicated healthcare associated pneumonia and he was admitted for further treatments..   Assessment & Plan:   Principal Problem:   HCAP (healthcare-associated pneumonia) Active Problems:   OSA on CPAP   HTN (hypertension)   DM (diabetes mellitus), type 2 with renal complications (HCC)   Anemia in chronic renal disease   Acute respiratory failure with hypoxia (HCC)   Thrombocytopenia (HCC)   ESRD on dialysis ([place])   End stage renal disease ([place])  1. HCAP. Chest x-ray was consistent with bilateral pneumonia. Respiratory panel has been negative. Blood cultures have shown no growth today. Sputum culture process. Continue broad spectrum antibiotics and pulmonary hygiene.  2. Acute respiratory failure with hypoxia. Likely related to pneumonia. Continue antibiotics, pulmonary hygiene. Will add steroids. 3. ESRD. Patient is managed with hemodialysis MWF. He states he completed dialysis on 67/67 with no complications. Nephrology following. Will undergo dialysis today. 4. Thrombocytopenia. Platelet count was 79,000 on admission, trended down to 69,000. There are no signs of bleeding. This is likely related to infection. Continue to monitor.  5. Normocytic anemia. Hemoglobin has trended down to 8.8 since admission. There are no signs of bleeding. Continue to monitor.  6. HTN. Pressures remain elevated. Continue Norvasc, coreg, doxazosin, and hydralazine.  7. OSA. Continue CPAP.   DVT prophylaxis: SCDs  Code Status: Full  Family Communication: No family bedside Disposition Plan:  Discharge home once improved.    Consultants:   None   Procedures:   None   Antimicrobials:   Vancomycin 11/16 >>  Cefepime 11/16>>   Subjective: Patient feels unwell. Complains of nausea, headache, worsening shortness of breath. Unable to produce sputum with cough.  Objective: Vitals:   12/09/15 0600 12/09/15 0700 12/09/15 0800 12/09/15 0852  BP: (!) 154/76 (!) 147/62 139/65   Pulse: 71 65 74   Resp:      Temp:      TempSrc:      SpO2: 93% 96% 97% 95%  Weight:      Height:        Intake/Output Summary (Last 24 hours) at 12/09/15 0944 Last data filed at 12/09/15 0903  Gross per 24 hour  Intake              240 ml  Output                0 ml  Net              240 ml   Filed Weights   12/07/15 2101 12/08/15 0129 12/09/15 0500  Weight: 106.6 kg (235 lb) 102.7 kg (226 lb 6.6 oz) 104.7 kg (230 lb 13.2 oz)    Examination:  General exam: Appears Uncomfortable, sitting up on side of bed Respiratory system: Scattered rhonchi. Increased Respiratory effort. Cardiovascular system: S1 & S2 heard, RRR. No JVD, murmurs, rubs, gallops or clicks. No pedal edema. Gastrointestinal system: Abdomen is nondistended, soft and nontender. No organomegaly or masses felt. Normal bowel sounds heard. Central nervous system: Alert and oriented. No focal neurological deficits. Extremities: Symmetric 5 x 5 power. Skin: No rashes, lesions or ulcers Psychiatry: Judgement  and insight appear normal. Mood & affect appropriate.     Data Reviewed: I have personally reviewed following labs and imaging studies  CBC:  Recent Labs Lab 12/07/15 2110 12/09/15 0427  WBC 7.6 5.3  HGB 10.2* 8.8*  HCT 31.5* 27.8*  MCV 98.7 101.5*  PLT 79* 69*   Basic Metabolic Panel:  Recent Labs Lab 12/07/15 2110 12/09/15 0427  NA 140 137  K 4.2 4.6  CL 99* 97*  CO2 31 30  GLUCOSE 122* 114*  BUN 18 35*  CREATININE 7.22* 12.08*  CALCIUM 8.9 9.1  PHOS  --  5.2*   GFR: Estimated Creatinine  Clearance: 8.2 mL/min (by C-G formula based on SCr of 12.08 mg/dL (H)). Liver Function Tests:  Recent Labs Lab 12/09/15 0427  ALBUMIN 3.4*   No results for input(s): LIPASE, AMYLASE in the last 168 hours. No results for input(s): AMMONIA in the last 168 hours. Coagulation Profile: No results for input(s): INR, PROTIME in the last 168 hours. Cardiac Enzymes:  Recent Labs Lab 12/07/15 2110  TROPONINI <0.03   BNP (last 3 results) No results for input(s): PROBNP in the last 8760 hours. HbA1C: No results for input(s): HGBA1C in the last 72 hours. CBG: No results for input(s): GLUCAP in the last 168 hours. Lipid Profile: No results for input(s): CHOL, HDL, LDLCALC, TRIG, CHOLHDL, LDLDIRECT in the last 72 hours. Thyroid Function Tests: No results for input(s): TSH, T4TOTAL, FREET4, T3FREE, THYROIDAB in the last 72 hours. Anemia Panel: No results for input(s): VITAMINB12, FOLATE, FERRITIN, TIBC, IRON, RETICCTPCT in the last 72 hours. Sepsis Labs: No results for input(s): PROCALCITON, LATICACIDVEN in the last 168 hours.  Recent Results (from the past 240 hour(s))  Culture, blood (Routine X 2) w Reflex to ID Panel     Status: None (Preliminary result)   Collection Time: 12/08/15 12:21 AM  Result Value Ref Range Status   Specimen Description BLOOD RIGHT ANTECUBITAL  Final   Special Requests BOTTLES DRAWN AEROBIC AND ANAEROBIC 6CC EACH  Final   Culture NO GROWTH < 12 HOURS  Final   Report Status PENDING  Incomplete  Culture, blood (Routine X 2) w Reflex to ID Panel     Status: None (Preliminary result)   Collection Time: 12/08/15 12:28 AM  Result Value Ref Range Status   Specimen Description BLOOD RIGHT HAND  Final   Special Requests   Final    BOTTLES DRAWN AEROBIC AND ANAEROBIC AEB=6CC ANA=4CC   Culture NO GROWTH < 12 HOURS  Final   Report Status PENDING  Incomplete  MRSA PCR Screening     Status: None   Collection Time: 12/08/15  1:22 AM  Result Value Ref Range Status    MRSA by PCR NEGATIVE NEGATIVE Final    Comment:        The GeneXpert MRSA Assay (FDA approved for NASAL specimens only), is one component of a comprehensive MRSA colonization surveillance program. It is not intended to diagnose MRSA infection nor to guide or monitor treatment for MRSA infections.   Culture, sputum-assessment     Status: None   Collection Time: 12/08/15  1:43 AM  Result Value Ref Range Status   Specimen Description SPUTUM  Final   Special Requests Immunocompromised  Final   Sputum evaluation   Final    THIS SPECIMEN IS ACCEPTABLE. RESPIRATORY CULTURE REPORT TO FOLLOW.   Report Status 12/09/2015 FINAL  Final  Respiratory Panel by PCR     Status: None   Collection Time: 12/08/15  2:15 AM  Result Value Ref Range Status   Adenovirus NOT DETECTED NOT DETECTED Final   Coronavirus 229E NOT DETECTED NOT DETECTED Final   Coronavirus HKU1 NOT DETECTED NOT DETECTED Final   Coronavirus NL63 NOT DETECTED NOT DETECTED Final   Coronavirus OC43 NOT DETECTED NOT DETECTED Final   Metapneumovirus NOT DETECTED NOT DETECTED Final   Rhinovirus / Enterovirus NOT DETECTED NOT DETECTED Final   Influenza A NOT DETECTED NOT DETECTED Final   Influenza B NOT DETECTED NOT DETECTED Final   Parainfluenza Virus 1 NOT DETECTED NOT DETECTED Final   Parainfluenza Virus 2 NOT DETECTED NOT DETECTED Final   Parainfluenza Virus 3 NOT DETECTED NOT DETECTED Final   Parainfluenza Virus 4 NOT DETECTED NOT DETECTED Final   Respiratory Syncytial Virus NOT DETECTED NOT DETECTED Final   Bordetella pertussis NOT DETECTED NOT DETECTED Final   Chlamydophila pneumoniae NOT DETECTED NOT DETECTED Final   Mycoplasma pneumoniae NOT DETECTED NOT DETECTED Final    Comment: Performed at [facility]  Culture, respiratory (NON-Expectorated)     Status: None (Preliminary result)   Collection Time: 12/09/15 12:43 AM  Result Value Ref Range Status   Specimen Description SPUTUM  Final   Special Requests NONE   Final   Gram Stain   Final    ABUNDANT WBC PRESENT, PREDOMINANTLY PMN MODERATE GRAM POSITIVE RODS FEW GRAM NEGATIVE RODS FEW GRAM POSITIVE COCCI IN PAIRS Performed at [facility]    Culture PENDING  Incomplete   Report Status PENDING  Incomplete         Radiology Studies: Dg Chest 2 View  Result Date: 12/07/2015 CLINICAL DATA:  Productive cough and fever for 2 days. EXAM: CHEST  2 VIEW COMPARISON:  05/17/2015 FINDINGS: Mild interstitial coarsening, possibly due to an interstitial infiltrate as it does appear new from 04/25 17. No airspace consolidation. No effusions. Hilar and mediastinal contours are normal and unchanged. IMPRESSION: Mild interstitial coarsening.  No consolidation or effusion. Electronically Signed   By: Andreas Newport M.D.   On: 12/07/2015 22:20   Ct Chest Wo Contrast  Result Date: 12/07/2015 CLINICAL DATA:  Intermittent substernal chest pain onset yesterday. Developed dyspnea and cough 2 nights ago. EXAM: CT CHEST WITHOUT CONTRAST TECHNIQUE: Multidetector CT imaging of the chest was performed following the standard protocol without IV contrast. COMPARISON:  CXR 12/07/2015, chest CT 05/17/2015 FINDINGS: Cardiovascular: The heart is top-normal in size with trace pericardial fluid. There is coronary arteriosclerosis along the LAD and minimally in the RCA. No aortic aneurysm. There is aortic atherosclerosis. Mediastinum/Nodes: No axillary, supraclavicular or mediastinal lymphadenopathy. Small paratracheal lymph nodes are noted up to 6 mm on the left and 9 mm on the right. There is a 15 mm subcarinal short axis lymph node. These findings may be reactive in etiology. The esophagus and trachea are unremarkable. Lungs/Pleura: Bilateral lower lobe pneumonic consolidations are seen left greater than right with air bronchograms on the left. Tree-in-bud densities consistent with bronchitic change are also seen in the lower lobes and lingula. No effusion or pneumothorax.  Upper Abdomen: Cholecystectomy. No acute upper abdominal abnormality. Musculoskeletal: No acute nor suspicious osseous lesions. IMPRESSION: Bilateral left greater than right lower lobe pneumonic consolidations with bronchitic change in both lower lobes and lingula. Mild reactive mediastinal lymphadenopathy. Aortic atherosclerosis.  Coronary arteriosclerosis. Electronically Signed   By: Ashley Royalty M.D.   On: 12/07/2015 23:58        Scheduled Meds: . amLODipine  10 mg Oral Daily  . aspirin  EC  81 mg Oral Daily  . calcitRIOL  0.25 mcg Oral Q M,W,F-HD  . calcium acetate  2,001 mg Oral TID WC  . carvedilol  25 mg Oral BID WC  . ceFEPime (MAXIPIME) IV  2 g Intravenous Q M,W,F-HD  . cholecalciferol  5,000 Units Oral Daily  . doxazosin  2 mg Oral Q12H  . epoetin (EPOGEN/PROCRIT) injection  14,000 Units Subcutaneous Q M,W,F-HD  . guaiFENesin  1,200 mg Oral BID  . ipratropium-albuterol  3 mL Nebulization Q6H  . methylPREDNISolone (SOLU-MEDROL) injection  60 mg Intravenous Q6H  . mometasone-formoterol  2 puff Inhalation BID  . multivitamin  1 tablet Oral QHS  . pantoprazole  40 mg Oral BID  . pravastatin  40 mg Oral QPM  . sodium chloride flush  3 mL Intravenous Q12H  . vancomycin  1,000 mg Intravenous Q M,W,F-HD   Continuous Infusions:   LOS: 1 day    Time spent: 25 minutes     Kathie Dike, MD [facility] If 7PM-7AM, please contact night-coverage [email or web address] Password [email or web address] 12/09/2015, 9:44 AM

## 2015-12-09 NOTE — Care Management Note (Signed)
Case Management Note  Patient Details  Name: SHYLOH KRINKE MRN: [id number] Date of Birth: 07-Sep-1960  Subjective/Objective:                  Admitted with HCAP. Pt is from home, lives with wife and daughter. Pt is ind with ADL's. He has PCP, drives himself to appointments, has insurance with drug coverage. He has CPAP and neb machine, no home oxygen. Pt gets HD at [facility] in [place] three days a week. Family at bedside.   Action/Plan: Pt plans to return home with self care. No CM needs. Will need to wean from oxygen prior to DC.   Expected Discharge Date:     12/12/2015             Expected Discharge Plan:  Home/Self Care  In-House Referral:  NA  Discharge planning Services  CM Consult  Post Acute Care Choice:  NA Choice offered to:  NA  Status of Service:  Completed, signed off  Sherald Barge, RN 12/09/2015, 1:50 PM

## 2015-12-09 NOTE — Care Management Important Message (Signed)
Important Message  Patient Details  Name: DRAVEN NATTER MRN: [id number] Date of Birth: 17-Jan-1961   Medicare Important Message Given:  Yes    Sherald Barge, RN 12/09/2015, 1:49 PM

## 2015-12-09 NOTE — Progress Notes (Signed)
Subjective: Interval History: has no complaint of difficulty in breathing. Patient is still have cough with some sputum production. He didn't see significant change as far as his cough is concerned. He doesn't have any fever or chills or sweating..  Objective: Vital signs in last 24 hours: Temp:  [98.3 F (36.8 C)-99 F (37.2 C)] 98.6 F (37 C) (11/17 0400) Pulse Rate:  [70-88] 70 (11/17 0500) Resp:  [18-35] 18 (11/17 0300) BP: (135-177)/(51-85) 156/78 (11/17 0500) SpO2:  [90 %-97 %] 94 % (11/17 0500) Weight:  [104.7 kg (230 lb 13.2 oz)] 104.7 kg (230 lb 13.2 oz) (11/17 0500) Weight change: -1.895 kg (-4 lb 2.9 oz)  Intake/Output from previous day: 11/16 0701 - 11/17 0700 In: 80 [P.O.:80] Out: -  Intake/Output this shift: No intake/output data recorded.  General appearance: alert, cooperative and no distress Resp: diminished breath sounds bilaterally and wheezes bilaterally Cardio: regular rate and rhythm Extremities: No edema  Lab Results:  Recent Labs  12/07/15 2110 12/09/15 0427  WBC 7.6 5.3  HGB 10.2* 8.8*  HCT 31.5* 27.8*  PLT 79* 69*   BMET:  Recent Labs  12/07/15 2110 12/09/15 0427  NA 140 137  K 4.2 4.6  CL 99* 97*  CO2 31 30  GLUCOSE 122* 114*  BUN 18 35*  CREATININE 7.22* 12.08*  CALCIUM 8.9 9.1   No results for input(s): PTH in the last 72 hours. Iron Studies: No results for input(s): IRON, TIBC, TRANSFERRIN, FERRITIN in the last 72 hours.  Studies/Results: Dg Chest 2 View  Result Date: 12/07/2015 CLINICAL DATA:  Productive cough and fever for 2 days. EXAM: CHEST  2 VIEW COMPARISON:  05/17/2015 FINDINGS: Mild interstitial coarsening, possibly due to an interstitial infiltrate as it does appear new from 04/25 17. No airspace consolidation. No effusions. Hilar and mediastinal contours are normal and unchanged. IMPRESSION: Mild interstitial coarsening.  No consolidation or effusion. Electronically Signed   By: Andreas Newport M.D.   On: 12/07/2015  22:20   Ct Chest Wo Contrast  Result Date: 12/07/2015 CLINICAL DATA:  Intermittent substernal chest pain onset yesterday. Developed dyspnea and cough 2 nights ago. EXAM: CT CHEST WITHOUT CONTRAST TECHNIQUE: Multidetector CT imaging of the chest was performed following the standard protocol without IV contrast. COMPARISON:  CXR 12/07/2015, chest CT 05/17/2015 FINDINGS: Cardiovascular: The heart is top-normal in size with trace pericardial fluid. There is coronary arteriosclerosis along the LAD and minimally in the RCA. No aortic aneurysm. There is aortic atherosclerosis. Mediastinum/Nodes: No axillary, supraclavicular or mediastinal lymphadenopathy. Small paratracheal lymph nodes are noted up to 6 mm on the left and 9 mm on the right. There is a 15 mm subcarinal short axis lymph node. These findings may be reactive in etiology. The esophagus and trachea are unremarkable. Lungs/Pleura: Bilateral lower lobe pneumonic consolidations are seen left greater than right with air bronchograms on the left. Tree-in-bud densities consistent with bronchitic change are also seen in the lower lobes and lingula. No effusion or pneumothorax. Upper Abdomen: Cholecystectomy. No acute upper abdominal abnormality. Musculoskeletal: No acute nor suspicious osseous lesions. IMPRESSION: Bilateral left greater than right lower lobe pneumonic consolidations with bronchitic change in both lower lobes and lingula. Mild reactive mediastinal lymphadenopathy. Aortic atherosclerosis.  Coronary arteriosclerosis. Electronically Signed   By: Ashley Royalty M.D.   On: 12/07/2015 23:58    I have reviewed the patient's current medications.  Assessment/Plan: Problem #1 history of pneumonia: Presently on antibiotics. Patient is afebrile with normal white blood cell count. Patient however  continued to cough. Problem #2 history of sleep apnea: He is on sleep apnea Problem #3 anemia: His hemoglobin is below our target goal and declining. Problem #4  metabolic bone disease: His calcium and phosphorus is range Problem #5 hypertension: His blood pressure is reasonably controlled Problem #6 fluid management: Patient at this moment doesn't show significant signs of fluid overload. Problem #7 obesity Plan: We'll make arrangements for patient to get dialysis today We'll try to move about 3 L if systolic blood pressure remains above 90 We'll give him Epogen during dialysis.    LOS: 1 day   Chidiebere Wynn S 12/09/2015,8:20 AM

## 2015-12-10 LAB — CBC
HCT: 28.7 % — ABNORMAL LOW (ref 39.0–52.0)
Hemoglobin: 9.6 g/dL — ABNORMAL LOW (ref 13.0–17.0)
MCH: 32.3 pg (ref 26.0–34.0)
MCHC: 33.4 g/dL (ref 30.0–36.0)
MCV: 96.6 fL (ref 78.0–100.0)
PLATELETS: 85 10*3/uL — AB (ref 150–400)
RBC: 2.97 MIL/uL — AB (ref 4.22–5.81)
RDW: 13.6 % (ref 11.5–15.5)
WBC: 5.3 10*3/uL (ref 4.0–10.5)

## 2015-12-10 LAB — RENAL FUNCTION PANEL
ALBUMIN: 3.5 g/dL (ref 3.5–5.0)
Anion gap: 11 (ref 5–15)
BUN: 36 mg/dL — ABNORMAL HIGH (ref 6–20)
CALCIUM: 9.2 mg/dL (ref 8.9–10.3)
CO2: 28 mmol/L (ref 22–32)
CREATININE: 9.42 mg/dL — AB (ref 0.61–1.24)
Chloride: 97 mmol/L — ABNORMAL LOW (ref 101–111)
GFR, EST AFRICAN AMERICAN: 6 mL/min — AB (ref >60)
GFR, EST NON AFRICAN AMERICAN: 6 mL/min — AB (ref >60)
Glucose, Bld: 230 mg/dL — ABNORMAL HIGH (ref 65–99)
PHOSPHORUS: 1.9 mg/dL — AB (ref 2.5–4.6)
Potassium: 4.2 mmol/L (ref 3.5–5.1)
SODIUM: 136 mmol/L (ref 135–145)

## 2015-12-10 LAB — HEPATITIS B SURFACE ANTIGEN: HEP B S AG: NEGATIVE

## 2015-12-10 MED ORDER — LEVOFLOXACIN 750 MG PO TABS
750.0000 mg | ORAL_TABLET | Freq: Every day | ORAL | Status: DC
  Administered 2015-12-10 – 2015-12-11 (×2): 750 mg via ORAL
  Filled 2015-12-10 (×2): qty 1

## 2015-12-10 MED ORDER — ONDANSETRON HCL 4 MG/2ML IJ SOLN
4.0000 mg | Freq: Four times a day (QID) | INTRAMUSCULAR | Status: DC
  Administered 2015-12-10 – 2015-12-11 (×3): 4 mg via INTRAVENOUS
  Filled 2015-12-10 (×3): qty 2

## 2015-12-10 MED ORDER — ORAL CARE MOUTH RINSE
15.0000 mL | Freq: Two times a day (BID) | OROMUCOSAL | Status: DC
  Administered 2015-12-11: 15 mL via OROMUCOSAL

## 2015-12-10 MED ORDER — HYDRALAZINE HCL 25 MG PO TABS
25.0000 mg | ORAL_TABLET | Freq: Three times a day (TID) | ORAL | Status: DC
  Administered 2015-12-10 – 2015-12-11 (×2): 25 mg via ORAL
  Filled 2015-12-10 (×2): qty 1

## 2015-12-10 NOTE — Progress Notes (Signed)
PROGRESS NOTE    Kirk [name]  YSA:[id number] DOB: 1960/11/05 DOA: 12/07/2015 PCP: Jeanmarie Hubert, MD    Brief Narrative:  38 yom with a history of end-stage renal disease on hemodialysis, HTN, thrombocytopenia, anemia, GERD, anxiety, and OSA, presented with complaints of fever, chills, pleuritic chest pain, productive cough, and dyspnea. While in the ED, he was found to be tachypenic, and hypertensive. Imaging indicated healthcare associated pneumonia and he was admitted for further treatments..   Assessment & Plan:   Principal Problem:   HCAP (healthcare-associated pneumonia) Active Problems:   OSA on CPAP   HTN (hypertension)   DM (diabetes mellitus), type 2 with renal complications (HCC)   Anemia in chronic renal disease   Acute respiratory failure with hypoxia (HCC)   Thrombocytopenia (HCC)   ESRD on dialysis ([place])   End stage renal disease ([place])  1. HCAP. Chest x-ray was consistent with bilateral pneumonia. Respiratory panel has been negative. Blood cultures have shown no growth to date. Sputum culture in process. Currently on vancomycin and cefepime. Will de escalate to levaquin. Continue pulmonary hygiene.   2. Acute respiratory failure with hypoxia. Likely related to pneumonia. Continue antibiotics, pulmonary hygiene. Now breathing comfortably on room air 3. ESRD. Patient is managed with hemodialysis MWF. He states he completed dialysis on 32/35 with no complications. Nephrology following. He underwent dialysis yesterday. 4. Thrombocytopenia. Platelet count was 79,000 on admission, trending up to 85,000. There are no signs of bleeding. This was likely related to infection, but appears to be improving. Continue to monitor.  5. Normocytic anemia. Hemoglobin has improved to 9.6 over night. There are no signs of bleeding. Reckeck CBC in the a.m and continue to monitor. 6. HTN. Pressures remain elevated. Continue Norvasc, coreg, doxazosin, and hydralazine.  7. OSA. Continue CPAP.     DVT prophylaxis: SCDs  Code Status: Full  Family Communication: discussed with wife at the bedside Disposition Plan: Discharge home once improved, possibly in AM    Consultants:   Nephrology   Procedures:   None   Antimicrobials:   Vancomycin 11/16 >>11/18  Cefepime 11/16>>11/18  Levaquin 11/18>>   Subjective: Feeling better today. Breathing improving. Cough improving.  Objective: Vitals:   12/09/15 2000 12/10/15 0000 12/10/15 0106 12/10/15 0400  BP:      Pulse: 84  92   Resp:   18   Temp: 97.3 F (36.3 C) 97 F (36.1 C)  98.4 F (36.9 C)  TempSrc: Oral Oral  Oral  SpO2: 94%  92%   Weight:    102.1 kg (225 lb 1.4 oz)  Height:        Intake/Output Summary (Last 24 hours) at 12/10/15 0716 Last data filed at 12/09/15 1655  Gross per 24 hour  Intake              720 ml  Output             2750 ml  Net            -2030 ml   Filed Weights   12/09/15 0500 12/09/15 1230 12/10/15 0400  Weight: 104.7 kg (230 lb 13.2 oz) 102.7 kg (226 lb 6.6 oz) 102.1 kg (225 lb 1.4 oz)    Examination:   General exam: Appears calm and comfortable  Respiratory system: Clear to auscultation. Respiratory effort normal. Cardiovascular system: S1 & S2 heard, RRR. No JVD, murmurs, rubs, gallops or clicks. No pedal edema. Gastrointestinal system: Abdomen is nondistended, soft and nontender. No organomegaly or masses  felt. Normal bowel sounds heard. Central nervous system: Alert and oriented. No focal neurological deficits. Extremities: Symmetric 5 x 5 power. Skin: No rashes, lesions or ulcers Psychiatry: Judgement and insight appear normal. Mood & affect appropriate.      Data Reviewed: I have personally reviewed following labs and imaging studies  CBC:  Recent Labs Lab 12/07/15 2110 12/09/15 0427 12/09/15 0959 12/10/15 0509  WBC 7.6 5.3 6.2 5.3  HGB 10.2* 8.8* 9.4* 9.6*  HCT 31.5* 27.8* 29.2* 28.7*  MCV 98.7 101.5* 99.3 96.6  PLT 79* 69* 70* PENDING   Basic  Metabolic Panel:  Recent Labs Lab 12/07/15 2110 12/09/15 0427 12/09/15 0959  NA 140 137 136  K 4.2 4.6 4.5  CL 99* 97* 96*  CO2 31 30 28   GLUCOSE 122* 114* 133*  BUN 18 35* 38*  CREATININE 7.22* 12.08* 13.30*  CALCIUM 8.9 9.1 9.1  PHOS  --  5.2* 5.1*   GFR: Estimated Creatinine Clearance: 7.3 mL/min (by C-G formula based on SCr of 13.3 mg/dL (H)). Liver Function Tests:  Recent Labs Lab 12/09/15 0427 12/09/15 0959  ALBUMIN 3.4* 3.7   Cardiac Enzymes:  Recent Labs Lab 12/07/15 2110  TROPONINI <0.03    Recent Results (from the past 240 hour(s))  Culture, blood (Routine X 2) w Reflex to ID Panel     Status: None (Preliminary result)   Collection Time: 12/08/15 12:21 AM  Result Value Ref Range Status   Specimen Description BLOOD RIGHT ANTECUBITAL  Final   Special Requests BOTTLES DRAWN AEROBIC AND ANAEROBIC 6CC EACH  Final   Culture NO GROWTH 1 DAY  Final   Report Status PENDING  Incomplete  Culture, blood (Routine X 2) w Reflex to ID Panel     Status: None (Preliminary result)   Collection Time: 12/08/15 12:28 AM  Result Value Ref Range Status   Specimen Description BLOOD RIGHT HAND  Final   Special Requests   Final    BOTTLES DRAWN AEROBIC AND ANAEROBIC AEB=6CC ANA=4CC   Culture NO GROWTH 1 DAY  Final   Report Status PENDING  Incomplete  MRSA PCR Screening     Status: None   Collection Time: 12/08/15  1:22 AM  Result Value Ref Range Status   MRSA by PCR NEGATIVE NEGATIVE Final    Comment:        The GeneXpert MRSA Assay (FDA approved for NASAL specimens only), is one component of a comprehensive MRSA colonization surveillance program. It is not intended to diagnose MRSA infection nor to guide or monitor treatment for MRSA infections.   Culture, sputum-assessment     Status: None   Collection Time: 12/08/15  1:43 AM  Result Value Ref Range Status   Specimen Description SPUTUM  Final   Special Requests Immunocompromised  Final   Sputum evaluation    Final    THIS SPECIMEN IS ACCEPTABLE. RESPIRATORY CULTURE REPORT TO FOLLOW.   Report Status 12/09/2015 FINAL  Final  Respiratory Panel by PCR     Status: None   Collection Time: 12/08/15  2:15 AM  Result Value Ref Range Status   Adenovirus NOT DETECTED NOT DETECTED Final   Coronavirus 229E NOT DETECTED NOT DETECTED Final   Coronavirus HKU1 NOT DETECTED NOT DETECTED Final   Coronavirus NL63 NOT DETECTED NOT DETECTED Final   Coronavirus OC43 NOT DETECTED NOT DETECTED Final   Metapneumovirus NOT DETECTED NOT DETECTED Final   Rhinovirus / Enterovirus NOT DETECTED NOT DETECTED Final   Influenza A NOT DETECTED NOT  DETECTED Final   Influenza B NOT DETECTED NOT DETECTED Final   Parainfluenza Virus 1 NOT DETECTED NOT DETECTED Final   Parainfluenza Virus 2 NOT DETECTED NOT DETECTED Final   Parainfluenza Virus 3 NOT DETECTED NOT DETECTED Final   Parainfluenza Virus 4 NOT DETECTED NOT DETECTED Final   Respiratory Syncytial Virus NOT DETECTED NOT DETECTED Final   Bordetella pertussis NOT DETECTED NOT DETECTED Final   Chlamydophila pneumoniae NOT DETECTED NOT DETECTED Final   Mycoplasma pneumoniae NOT DETECTED NOT DETECTED Final    Comment: Performed at [facility]  Culture, respiratory (NON-Expectorated)     Status: None (Preliminary result)   Collection Time: 12/09/15 12:43 AM  Result Value Ref Range Status   Specimen Description SPUTUM  Final   Special Requests NONE  Final   Gram Stain   Final    ABUNDANT WBC PRESENT, PREDOMINANTLY PMN MODERATE GRAM POSITIVE RODS FEW GRAM NEGATIVE RODS FEW GRAM POSITIVE COCCI IN PAIRS Performed at [facility]    Culture PENDING  Incomplete   Report Status PENDING  Incomplete     Radiology Studies: No results found.   Scheduled Meds: . amLODipine  10 mg Oral Daily  . aspirin EC  81 mg Oral Daily  . calcitRIOL  0.25 mcg Oral Q M,W,F-HD  . calcium acetate  2,001 mg Oral TID WC  . carvedilol  25 mg Oral BID WC  . ceFEPime (MAXIPIME)  IV  2 g Intravenous Q M,W,F-HD  . cholecalciferol  5,000 Units Oral Daily  . doxazosin  2 mg Oral Q12H  . epoetin (EPOGEN/PROCRIT) injection  14,000 Units Subcutaneous Q M,W,F-HD  . guaiFENesin  1,200 mg Oral BID  . ipratropium-albuterol  3 mL Nebulization Q6H  . mouth rinse  15 mL Mouth Rinse BID  . methylPREDNISolone (SOLU-MEDROL) injection  60 mg Intravenous Q6H  . mometasone-formoterol  2 puff Inhalation BID  . multivitamin  1 tablet Oral QHS  . pantoprazole  40 mg Oral BID  . pravastatin  40 mg Oral QPM  . sodium chloride flush  3 mL Intravenous Q12H  . vancomycin  1,000 mg Intravenous Q M,W,F-HD   Continuous Infusions:   LOS: 2 days    Time spent: 25 minutes     Kathie Dike, MD [facility] If 7PM-7AM, please contact night-coverage [email or web address] Password [email or web address] 12/10/2015, 7:16 AM

## 2015-12-10 NOTE — Progress Notes (Signed)
Pt had episode of vomiting at [place] on my shift. I gave 12.5 mg phenergan IV push at 1952. Pt had another episode of vomiting around 2020. Around 2100 pt states he felt a lot better and wanted to try saltine crackers. He was able to keep this down so he had a sandwich, ginger ale and his meds by mouth. Has not vomited since 2020 tonight and will continue to monitor.   Mahayla Haddaway Rica Mote, RN

## 2015-12-10 NOTE — Progress Notes (Signed)
Subjective: Interval History: Patient had episode of nausea and vomiting last night where he was not able to keep anything down. He denies any abdominal pain, diarrhea. Patient also denies any difficulty breathing. This morning he seems to be feeling better.  Objective: Vital signs in last 24 hours: Temp:  [97 F (36.1 C)-98.7 F (37.1 C)] 98.4 F (36.9 C) (11/18 0400) Pulse Rate:  [71-92] 90 (11/18 0824) Resp:  [18-76] 18 (11/18 0106) BP: (143-177)/(48-87) 177/60 (11/18 0824) SpO2:  [92 %-98 %] 94 % (11/18 0824) Weight:  [102.1 kg (225 lb 1.4 oz)-102.7 kg (226 lb 6.6 oz)] 102.1 kg (225 lb 1.4 oz) (11/18 0400) Weight change: -2 kg (-4 lb 6.6 oz)  Intake/Output from previous day: 11/17 0701 - 11/18 0700 In: 720 [P.O.:720] Out: 2750  Intake/Output this shift: No intake/output data recorded.  General appearance: alert, cooperative and no distress Resp: diminished breath sounds bilaterally and wheezes bilaterally Cardio: regular rate and rhythm Extremities: No edema  Lab Results:  Recent Labs  12/09/15 0959 12/10/15 0509  WBC 6.2 5.3  HGB 9.4* 9.6*  HCT 29.2* 28.7*  PLT 70* 85*   BMET:   Recent Labs  12/09/15 0959 12/10/15 0509  NA 136 136  K 4.5 4.2  CL 96* 97*  CO2 28 28  GLUCOSE 133* 230*  BUN 38* 36*  CREATININE 13.30* 9.42*  CALCIUM 9.1 9.2   No results for input(s): PTH in the last 72 hours. Iron Studies: No results for input(s): IRON, TIBC, TRANSFERRIN, FERRITIN in the last 72 hours.  Studies/Results: No results found.  I have reviewed the patient's current medications.  Assessment/Plan: Problem #1 history of pneumonia: Presently on antibiotics. Patient Remains afebrile. He has occasional cough but seems to be getting better. Problem #2 history of sleep apnea: He is on CPAP Problem #3 anemia: His hemoglobin is below our target goal but stable. Presently he is on Epogen. Problem #4 metabolic bone disease: His calcium is in arrange but his phosphorus  is low. The low phosphorus could be because of his binder PhosLo accompanied with nausea and vomiting i.e. Poor  by mouth intake. Problem #5 hypertension: His blood pressure is reasonably controlled Problem #6 fluid management: Patient at this moment doesn't show significant signs of fluid overload. When able to remove about 2700 mL of fluid with dialysis. Problem #7 obesity Plan: 1] Patient does require dialysis today 2] we'll DC PhosLo 3] we'll check his renal panel and CBC in the morning 4] his next dialysis will be on Monday which is her regular schedule.    LOS: 2 days   Tedd Cottrill S 12/10/2015,9:02 AM

## 2015-12-11 LAB — RENAL FUNCTION PANEL
ALBUMIN: 3.8 g/dL (ref 3.5–5.0)
ANION GAP: 15 (ref 5–15)
BUN: 70 mg/dL — ABNORMAL HIGH (ref 6–20)
CHLORIDE: 95 mmol/L — AB (ref 101–111)
CO2: 24 mmol/L (ref 22–32)
Calcium: 9.6 mg/dL (ref 8.9–10.3)
Creatinine, Ser: 12.38 mg/dL — ABNORMAL HIGH (ref 0.61–1.24)
GFR calc Af Amer: 5 mL/min — ABNORMAL LOW (ref >60)
GFR, EST NON AFRICAN AMERICAN: 4 mL/min — AB (ref >60)
GLUCOSE: 164 mg/dL — AB (ref 65–99)
PHOSPHORUS: 3 mg/dL (ref 2.5–4.6)
POTASSIUM: 5 mmol/L (ref 3.5–5.1)
Sodium: 134 mmol/L — ABNORMAL LOW (ref 135–145)

## 2015-12-11 LAB — CBC
HEMATOCRIT: 29.4 % — AB (ref 39.0–52.0)
HEMOGLOBIN: 9.8 g/dL — AB (ref 13.0–17.0)
MCH: 32.1 pg (ref 26.0–34.0)
MCHC: 33.3 g/dL (ref 30.0–36.0)
MCV: 96.4 fL (ref 78.0–100.0)
Platelets: 109 10*3/uL — ABNORMAL LOW (ref 150–400)
RBC: 3.05 MIL/uL — ABNORMAL LOW (ref 4.22–5.81)
RDW: 14 % (ref 11.5–15.5)
WBC: 8.4 10*3/uL (ref 4.0–10.5)

## 2015-12-11 LAB — CULTURE, RESPIRATORY: CULTURE: NORMAL

## 2015-12-11 MED ORDER — IPRATROPIUM-ALBUTEROL 0.5-2.5 (3) MG/3ML IN SOLN
3.0000 mL | Freq: Three times a day (TID) | RESPIRATORY_TRACT | Status: DC
  Administered 2015-12-11: 3 mL via RESPIRATORY_TRACT
  Filled 2015-12-11: qty 3

## 2015-12-11 MED ORDER — LEVOFLOXACIN 500 MG PO TABS: 500.0000 mg | ORAL_TABLET | ORAL | Status: DC

## 2015-12-11 MED ORDER — IPRATROPIUM-ALBUTEROL 0.5-2.5 (3) MG/3ML IN SOLN
3.0000 mL | Freq: Three times a day (TID) | RESPIRATORY_TRACT | 0 refills | Status: DC
Start: 2015-12-11 — End: 2015-12-11

## 2015-12-11 MED ORDER — HYDRALAZINE HCL 25 MG PO TABS: 25.0000 mg | ORAL_TABLET | Freq: Three times a day (TID) | ORAL | 0 refills | Status: DC

## 2015-12-11 MED ORDER — IPRATROPIUM-ALBUTEROL 0.5-2.5 (3) MG/3ML IN SOLN: 3.0000 mL | Freq: Three times a day (TID) | RESPIRATORY_TRACT | 0 refills | Status: DC

## 2015-12-11 MED ORDER — LEVOFLOXACIN 750 MG PO TABS: 750.0000 mg | ORAL_TABLET | Freq: Every day | ORAL | 0 refills | Status: DC

## 2015-12-11 MED ORDER — LEVOFLOXACIN 500 MG PO TABS: 500.0000 mg | ORAL_TABLET | ORAL | 0 refills | Status: DC

## 2015-12-11 NOTE — Progress Notes (Signed)
Patient discharged home.  IV removed by patient - WNL.  Reviewed DC instructions, medications, and follow up appointments.  Verbalizes understanding.  Handouts given.  No questions at this time.  In NAD at this time.

## 2015-12-11 NOTE — Progress Notes (Signed)
During chart check noticed an order for cardiac monitoring.  Patient was transferred from ICU during the day on 12/10/15 but was never placed on telemetry.  Paged on call MD to make sure that the telemetry was still needed.  Order received to discontinue cardiac monitoring.

## 2015-12-11 NOTE — Discharge Summary (Signed)
Physician Discharge Summary  Kirk [name] SWF:[id number] DOB: 1960-04-28 DOA: 12/07/2015  PCP: Jeanmarie Hubert, MD  Admit date: 12/07/2015 Discharge date: 12/11/2015  Admitted From: Home  Disposition: Home   Recommendations for Outpatient Follow-up:  1. Follow up with PCP in 1-2 weeks 2. Please obtain BMP/CBC in one week 3. Follow up at dialysis unit tomorrow for regular dialysis session  Home Health: None  Equipment/Devices: None   Discharge Condition: Stable  CODE STATUS: Full  Diet recommendation: heart healthy   Brief/Interim Summary: 57 yom with a history of end-stage renal disease on hemodialysis, HTN, thrombocytopenia, anemia, GERD, anxiety, and OSA, presented with complaints of fever, chills, pleuritic chest pain, productive cough, and dyspnea. While in the ED, he was found to be tachypenic, and hypertensive. Imaging indicated healthcare associated pneumonia. Patient was treated with empiric antibiotics cefepime and vancomycin. He has since been transitioned to oral levaquin. Blood and sputum cultures remained negative and he has been cleared for discharge.   Discharge Diagnoses:  Principal Problem:   HCAP (healthcare-associated pneumonia) Active Problems:   OSA on CPAP   HTN (hypertension)   DM (diabetes mellitus), type 2 with renal complications (HCC)   Anemia in chronic renal disease   Acute respiratory failure with hypoxia (HCC)   Thrombocytopenia (HCC)   ESRD on dialysis ([place])   End stage renal disease ([place])  1. HCAP. Chest x-ray was consistent with bilateral pneumonia. Respiratory viral panel was negative. Blood and sputum cultures remained negative. He was treated empirically with vancomycin and cefepime. He has since been afebrile and clinically improved. Will continue on oral antibiotic Levaquin as an outpatient.  2. Acute respiratory failure with hypoxia. This was related to pneumonia. He has since improved and oxygen has been weaned off. He is breathing  comfortably on room air at rest and on ambulation.  3. ESRD. Patient is managed with hemodialysis MWF. Nephrology followed him in the hospital and he was maintained on regular dialysis schedule.  4. Thrombocytopenia. Platelet count on discharge was  109,000, improved. There were no signs of bleeding. This was likely related to infection.Continue to monitor.  5. Normocytic anemia. Hemoglobin has improved to 9.8. There were no signs of bleeding.  6. HTN. Pressures remained stable. Continue Norvasc, coreg, doxazosin, and hydralazine as an outpatient.  7. OSA. Continue CPAP as an outpatient.   Discharge Instructions  Discharge Instructions    Diet - low sodium heart healthy    Complete by:  As directed    Increase activity slowly    Complete by:  As directed        Medication List    TAKE these medications   acetaminophen 500 MG tablet Commonly known as:  TYLENOL Take 1 tablet (500 mg total) by mouth every 6 (six) hours as needed for moderate pain.   amLODipine 10 MG tablet Commonly known as:  NORVASC Take 1 tablet (10 mg total) by mouth daily.   aspirin EC 81 MG tablet Take 81 mg by mouth daily.   b complex-vitamin c-folic acid 0.8 MG Tabs tablet Take 1 tablet by mouth daily.   BENADRYL 25 MG tablet Generic drug:  diphenhydrAMINE Take 50 mg by mouth 2 (two) times daily.   budesonide-formoterol 80-4.5 MCG/ACT inhaler Commonly known as:  SYMBICORT Inhale 2 puffs into the lungs 2 (two) times daily. What changed:  when to take this  reasons to take this   calcitRIOL 0.25 MCG capsule Commonly known as:  ROCALTROL Take 1 capsule (0.25 mcg total) by  mouth every Monday, Wednesday, and Friday with hemodialysis.   calcium acetate (Phos Binder) 667 MG/5ML Soln Commonly known as:  PHOSLYRA Take 2,001 mg by mouth 3 (three) times daily with meals. And 667 mg with snack   carvedilol 25 MG tablet Commonly known as:  COREG Take 1 tablet (25 mg total) by mouth 2 (two) times daily.  Take one tablet in the morning and one tablet in the evening   doxazosin 4 MG tablet Commonly known as:  CARDURA Take 0.5 tablet  by mouth in the am and 0.5 tablet by mouth in the pm to regulate blood pressure.   guaiFENesin-dextromethorphan 100-10 MG/5ML syrup Commonly known as:  ROBITUSSIN DM Take 10 mLs by mouth every 4 (four) hours as needed for cough.   hydrALAZINE 25 MG tablet Commonly known as:  APRESOLINE Take 1 tablet (25 mg total) by mouth 3 (three) times daily.   ipratropium-albuterol 0.5-2.5 (3) MG/3ML Soln Commonly known as:  DUONEB Take 3 mLs by nebulization 3 (three) times daily.   levofloxacin 750 MG tablet Commonly known as:  LEVAQUIN Take 1 tablet (750 mg total) by mouth daily. Start taking on:  12/12/2015   omeprazole 20 MG capsule Commonly known as:  PRILOSEC TAKE 1 CAPSULE (20 MG TOTAL) BY MOUTH 2 (TWO) TIMES DAILY BEFORE A MEAL.   pravastatin 40 MG tablet Commonly known as:  PRAVACHOL Take 1 tablet (40 mg total) by mouth every evening.   traMADol 50 MG tablet Commonly known as:  ULTRAM One up to 4 times daly if needed for pain   Vitamin D3 5000 units Tabs Take 1 tablet by mouth daily.       Allergies  Allergen Reactions  . Codeine Nausea And Vomiting and Nausea Only    Consultations:  Nephrology   Procedures/Studies: Dg Chest 2 View  Result Date: 12/07/2015 CLINICAL DATA:  Productive cough and fever for 2 days. EXAM: CHEST  2 VIEW COMPARISON:  05/17/2015 FINDINGS: Mild interstitial coarsening, possibly due to an interstitial infiltrate as it does appear new from 04/25 17. No airspace consolidation. No effusions. Hilar and mediastinal contours are normal and unchanged. IMPRESSION: Mild interstitial coarsening.  No consolidation or effusion. Electronically Signed   By: Andreas Newport M.D.   On: 12/07/2015 22:20   Ct Chest Wo Contrast  Result Date: 12/07/2015 CLINICAL DATA:  Intermittent substernal chest pain onset yesterday. Developed  dyspnea and cough 2 nights ago. EXAM: CT CHEST WITHOUT CONTRAST TECHNIQUE: Multidetector CT imaging of the chest was performed following the standard protocol without IV contrast. COMPARISON:  CXR 12/07/2015, chest CT 05/17/2015 FINDINGS: Cardiovascular: The heart is top-normal in size with trace pericardial fluid. There is coronary arteriosclerosis along the LAD and minimally in the RCA. No aortic aneurysm. There is aortic atherosclerosis. Mediastinum/Nodes: No axillary, supraclavicular or mediastinal lymphadenopathy. Small paratracheal lymph nodes are noted up to 6 mm on the left and 9 mm on the right. There is a 15 mm subcarinal short axis lymph node. These findings may be reactive in etiology. The esophagus and trachea are unremarkable. Lungs/Pleura: Bilateral lower lobe pneumonic consolidations are seen left greater than right with air bronchograms on the left. Tree-in-bud densities consistent with bronchitic change are also seen in the lower lobes and lingula. No effusion or pneumothorax. Upper Abdomen: Cholecystectomy. No acute upper abdominal abnormality. Musculoskeletal: No acute nor suspicious osseous lesions. IMPRESSION: Bilateral left greater than right lower lobe pneumonic consolidations with bronchitic change in both lower lobes and lingula. Mild reactive mediastinal lymphadenopathy. Aortic  atherosclerosis.  Coronary arteriosclerosis. Electronically Signed   By: Ashley Royalty M.D.   On: 12/07/2015 23:58    Antimicrobials:   Vancomycin 11/16 >>11/18  Cefepime 11/16>>11/18  Levaquin 11/18>>   Discharge Exam: Vitals:   12/10/15 2323 12/11/15 0607  BP:  (!) 163/75  Pulse: 89 72  Resp: 18 18  Temp:  98.4 F (36.9 C)   Vitals:   12/10/15 2135 12/10/15 2323 12/11/15 0607 12/11/15 0743  BP: (!) 158/67  (!) 163/75   Pulse: 80 89 72   Resp: 20 18 18    Temp: 97.5 F (36.4 C)  98.4 F (36.9 C)   TempSrc: Oral  Oral   SpO2: 95% 96% 96% 94%  Weight:      Height:        General: Pt is  alert, awake, not in acute distress Cardiovascular: RRR, S1/S2 +, no rubs, no gallops Respiratory: CTA bilaterally, no wheezing, no rhonchi Abdominal: Soft, NT, ND, bowel sounds + Extremities: no edema, no cyanosis    The results of significant diagnostics from this hospitalization (including imaging, microbiology, ancillary and laboratory) are listed below for reference.     Microbiology: Recent Results (from the past 240 hour(s))  Culture, blood (Routine X 2) w Reflex to ID Panel     Status: None (Preliminary result)   Collection Time: 12/08/15 12:21 AM  Result Value Ref Range Status   Specimen Description BLOOD RIGHT ANTECUBITAL  Final   Special Requests BOTTLES DRAWN AEROBIC AND ANAEROBIC 6CC EACH  Final   Culture NO GROWTH 3 DAYS  Final   Report Status PENDING  Incomplete  Culture, blood (Routine X 2) w Reflex to ID Panel     Status: None (Preliminary result)   Collection Time: 12/08/15 12:28 AM  Result Value Ref Range Status   Specimen Description BLOOD RIGHT HAND  Final   Special Requests   Final    BOTTLES DRAWN AEROBIC AND ANAEROBIC AEB=6CC ANA=4CC   Culture NO GROWTH 3 DAYS  Final   Report Status PENDING  Incomplete  MRSA PCR Screening     Status: None   Collection Time: 12/08/15  1:22 AM  Result Value Ref Range Status   MRSA by PCR NEGATIVE NEGATIVE Final    Comment:        The GeneXpert MRSA Assay (FDA approved for NASAL specimens only), is one component of a comprehensive MRSA colonization surveillance program. It is not intended to diagnose MRSA infection nor to guide or monitor treatment for MRSA infections.   Culture, sputum-assessment     Status: None   Collection Time: 12/08/15  1:43 AM  Result Value Ref Range Status   Specimen Description SPUTUM  Final   Special Requests Immunocompromised  Final   Sputum evaluation   Final    THIS SPECIMEN IS ACCEPTABLE. RESPIRATORY CULTURE REPORT TO FOLLOW.   Report Status 12/09/2015 FINAL  Final  Respiratory  Panel by PCR     Status: None   Collection Time: 12/08/15  2:15 AM  Result Value Ref Range Status   Adenovirus NOT DETECTED NOT DETECTED Final   Coronavirus 229E NOT DETECTED NOT DETECTED Final   Coronavirus HKU1 NOT DETECTED NOT DETECTED Final   Coronavirus NL63 NOT DETECTED NOT DETECTED Final   Coronavirus OC43 NOT DETECTED NOT DETECTED Final   Metapneumovirus NOT DETECTED NOT DETECTED Final   Rhinovirus / Enterovirus NOT DETECTED NOT DETECTED Final   Influenza A NOT DETECTED NOT DETECTED Final   Influenza B NOT DETECTED NOT  DETECTED Final   Parainfluenza Virus 1 NOT DETECTED NOT DETECTED Final   Parainfluenza Virus 2 NOT DETECTED NOT DETECTED Final   Parainfluenza Virus 3 NOT DETECTED NOT DETECTED Final   Parainfluenza Virus 4 NOT DETECTED NOT DETECTED Final   Respiratory Syncytial Virus NOT DETECTED NOT DETECTED Final   Bordetella pertussis NOT DETECTED NOT DETECTED Final   Chlamydophila pneumoniae NOT DETECTED NOT DETECTED Final   Mycoplasma pneumoniae NOT DETECTED NOT DETECTED Final    Comment: Performed at [facility]  Culture, respiratory (NON-Expectorated)     Status: None   Collection Time: 12/09/15 12:43 AM  Result Value Ref Range Status   Specimen Description SPUTUM  Final   Special Requests NONE  Final   Gram Stain   Final    ABUNDANT WBC PRESENT, PREDOMINANTLY PMN MODERATE GRAM POSITIVE RODS FEW GRAM NEGATIVE RODS FEW GRAM POSITIVE COCCI IN PAIRS    Culture   Final    Consistent with normal respiratory flora. Performed at [facility]    Report Status 12/11/2015 FINAL  Final     Labs: BNP (last 3 results)  Recent Labs  05/17/15 1300  BNP 409.8*   Basic Metabolic Panel:  Recent Labs Lab 12/07/15 2110 12/09/15 0427 12/09/15 0959 12/10/15 0509 12/11/15 0648  NA 140 137 136 136 134*  K 4.2 4.6 4.5 4.2 5.0  CL 99* 97* 96* 97* 95*  CO2 31 30 28 28 24   GLUCOSE 122* 114* 133* 230* 164*  BUN 18 35* 38* 36* 70*  CREATININE 7.22* 12.08*  13.30* 9.42* 12.38*  CALCIUM 8.9 9.1 9.1 9.2 9.6  PHOS  --  5.2* 5.1* 1.9* 3.0   Liver Function Tests:  Recent Labs Lab 12/09/15 0427 12/09/15 0959 12/10/15 0509 12/11/15 0648  ALBUMIN 3.4* 3.7 3.5 3.8   No results for input(s): LIPASE, AMYLASE in the last 168 hours. No results for input(s): AMMONIA in the last 168 hours. CBC:  Recent Labs Lab 12/07/15 2110 12/09/15 0427 12/09/15 0959 12/10/15 0509 12/11/15 0648  WBC 7.6 5.3 6.2 5.3 8.4  HGB 10.2* 8.8* 9.4* 9.6* 9.8*  HCT 31.5* 27.8* 29.2* 28.7* 29.4*  MCV 98.7 101.5* 99.3 96.6 96.4  PLT 79* 69* 70* 85* 109*   Cardiac Enzymes:  Recent Labs Lab 12/07/15 2110  TROPONINI <0.03   BNP: Invalid input(s): POCBNP CBG: No results for input(s): GLUCAP in the last 168 hours. D-Dimer No results for input(s): DDIMER in the last 72 hours. Hgb A1c No results for input(s): HGBA1C in the last 72 hours. Lipid Profile No results for input(s): CHOL, HDL, LDLCALC, TRIG, CHOLHDL, LDLDIRECT in the last 72 hours. Thyroid function studies No results for input(s): TSH, T4TOTAL, T3FREE, THYROIDAB in the last 72 hours.  Invalid input(s): FREET3 Anemia work up No results for input(s): VITAMINB12, FOLATE, FERRITIN, TIBC, IRON, RETICCTPCT in the last 72 hours. Urinalysis    Component Value Date/Time   COLORURINE YELLOW 04/06/2014 1030   APPEARANCEUR CLEAR 04/06/2014 1030   LABSPEC 1.020 04/06/2014 1030   PHURINE 8.0 04/06/2014 1030   GLUCOSEU 100 (A) 04/06/2014 1030   HGBUR MODERATE (A) 04/06/2014 1030   BILIRUBINUR NEGATIVE 04/06/2014 1030   KETONESUR NEGATIVE 04/06/2014 1030   PROTEINUR >300 (A) 04/06/2014 1030   UROBILINOGEN 0.2 04/06/2014 1030   NITRITE NEGATIVE 04/06/2014 1030   LEUKOCYTESUR NEGATIVE 04/06/2014 1030   Sepsis Labs Invalid input(s): PROCALCITONIN,  WBC,  LACTICIDVEN Microbiology Recent Results (from the past 240 hour(s))  Culture, blood (Routine X 2) w Reflex to  ID Panel     Status: None (Preliminary  result)   Collection Time: 12/08/15 12:21 AM  Result Value Ref Range Status   Specimen Description BLOOD RIGHT ANTECUBITAL  Final   Special Requests BOTTLES DRAWN AEROBIC AND ANAEROBIC 6CC EACH  Final   Culture NO GROWTH 3 DAYS  Final   Report Status PENDING  Incomplete  Culture, blood (Routine X 2) w Reflex to ID Panel     Status: None (Preliminary result)   Collection Time: 12/08/15 12:28 AM  Result Value Ref Range Status   Specimen Description BLOOD RIGHT HAND  Final   Special Requests   Final    BOTTLES DRAWN AEROBIC AND ANAEROBIC AEB=6CC ANA=4CC   Culture NO GROWTH 3 DAYS  Final   Report Status PENDING  Incomplete  MRSA PCR Screening     Status: None   Collection Time: 12/08/15  1:22 AM  Result Value Ref Range Status   MRSA by PCR NEGATIVE NEGATIVE Final    Comment:        The GeneXpert MRSA Assay (FDA approved for NASAL specimens only), is one component of a comprehensive MRSA colonization surveillance program. It is not intended to diagnose MRSA infection nor to guide or monitor treatment for MRSA infections.   Culture, sputum-assessment     Status: None   Collection Time: 12/08/15  1:43 AM  Result Value Ref Range Status   Specimen Description SPUTUM  Final   Special Requests Immunocompromised  Final   Sputum evaluation   Final    THIS SPECIMEN IS ACCEPTABLE. RESPIRATORY CULTURE REPORT TO FOLLOW.   Report Status 12/09/2015 FINAL  Final  Respiratory Panel by PCR     Status: None   Collection Time: 12/08/15  2:15 AM  Result Value Ref Range Status   Adenovirus NOT DETECTED NOT DETECTED Final   Coronavirus 229E NOT DETECTED NOT DETECTED Final   Coronavirus HKU1 NOT DETECTED NOT DETECTED Final   Coronavirus NL63 NOT DETECTED NOT DETECTED Final   Coronavirus OC43 NOT DETECTED NOT DETECTED Final   Metapneumovirus NOT DETECTED NOT DETECTED Final   Rhinovirus / Enterovirus NOT DETECTED NOT DETECTED Final   Influenza A NOT DETECTED NOT DETECTED Final   Influenza B NOT  DETECTED NOT DETECTED Final   Parainfluenza Virus 1 NOT DETECTED NOT DETECTED Final   Parainfluenza Virus 2 NOT DETECTED NOT DETECTED Final   Parainfluenza Virus 3 NOT DETECTED NOT DETECTED Final   Parainfluenza Virus 4 NOT DETECTED NOT DETECTED Final   Respiratory Syncytial Virus NOT DETECTED NOT DETECTED Final   Bordetella pertussis NOT DETECTED NOT DETECTED Final   Chlamydophila pneumoniae NOT DETECTED NOT DETECTED Final   Mycoplasma pneumoniae NOT DETECTED NOT DETECTED Final    Comment: Performed at [facility]  Culture, respiratory (NON-Expectorated)     Status: None   Collection Time: 12/09/15 12:43 AM  Result Value Ref Range Status   Specimen Description SPUTUM  Final   Special Requests NONE  Final   Gram Stain   Final    ABUNDANT WBC PRESENT, PREDOMINANTLY PMN MODERATE GRAM POSITIVE RODS FEW GRAM NEGATIVE RODS FEW GRAM POSITIVE COCCI IN PAIRS    Culture   Final    Consistent with normal respiratory flora. Performed at [facility]    Report Status 12/11/2015 FINAL  Final     Time coordinating discharge: Over 30 minutes  SIGNED:   Kathie Dike, MD [facility] 12/11/2015, 11:07 AM If 7PM-7AM, please contact night-coverage [email or web address] Password [email or web address]

## 2015-12-11 NOTE — Progress Notes (Signed)
Subjective: Interval History: Patient feeling much better today. He denies any nausea or vomiting. His last episode was yesterday. Patient also denies any difficulty breathing. He has still occasional cough with some sputum production.  Objective: Vital signs in last 24 hours: Temp:  [97.5 F (36.4 C)-98.7 F (37.1 C)] 98.4 F (36.9 C) (11/19 0607) Pulse Rate:  [72-90] 72 (11/19 0607) Resp:  [18-24] 18 (11/19 0607) BP: (153-193)/(67-75) 163/75 (11/19 0607) SpO2:  [90 %-96 %] 94 % (11/19 0743) Weight change:   Intake/Output from previous day: 11/18 0701 - 11/19 0700 In: 2536 [P.O.:1400; I.V.:5] Out: -  Intake/Output this shift: No intake/output data recorded.  Generally patient is alert and in no apparent distress Chest: Decreased breath sounds otherwise seems to be clear Heart exam regular rate and rhythm no murmur Extremities no edema  Lab Results:  Recent Labs  12/10/15 0509 12/11/15 0648  WBC 5.3 8.4  HGB 9.6* 9.8*  HCT 28.7* 29.4*  PLT 85* 109*   BMET:   Recent Labs  12/10/15 0509 12/11/15 0648  NA 136 134*  K 4.2 5.0  CL 97* 95*  CO2 28 24  GLUCOSE 230* 164*  BUN 36* 70*  CREATININE 9.42* 12.38*  CALCIUM 9.2 9.6   No results for input(s): PTH in the last 72 hours. Iron Studies: No results for input(s): IRON, TIBC, TRANSFERRIN, FERRITIN in the last 72 hours.  Studies/Results: No results found.  I have reviewed the patient's current medications.  Assessment/Plan: Problem #1 history of pneumonia: Presently on antibiotics. Blood culture no growth till now . He is afebrile and his white blood cell count is normal. Problem #2 history of sleep apnea: He is on CPAP Problem #3 anemia: His hemoglobin is slightly below our target goal but stable. Presently he is on Epogen. Problem #4 metabolic bone disease: His calcium is in arrange and his hypophosphatemia has improved after she was taken  off his binders. Problem #5 hypertension: His blood pressure is  reasonably controlled Problem #6 fluid management: Patient does not have sign and symptoms  of fluid overload.  Problem #7 obesity Plan: 1] Patient does  Not require dialysis today 2] we'll make arrangements for patient to get dialysis tomorrow and use some heparin during dialysis. 3] we'll check his renal panel and CBC in the morning     LOS: 3 days   Bharath Bernstein S 12/11/2015,8:45 AM

## 2015-12-12 DIAGNOSIS — D631 Anemia in chronic kidney disease: Secondary | ICD-10-CM | POA: Diagnosis not present

## 2015-12-12 DIAGNOSIS — N186 End stage renal disease: Secondary | ICD-10-CM | POA: Diagnosis not present

## 2015-12-12 DIAGNOSIS — E089 Diabetes mellitus due to underlying condition without complications: Secondary | ICD-10-CM | POA: Diagnosis not present

## 2015-12-12 DIAGNOSIS — N2581 Secondary hyperparathyroidism of renal origin: Secondary | ICD-10-CM | POA: Diagnosis not present

## 2015-12-12 DIAGNOSIS — D509 Iron deficiency anemia, unspecified: Secondary | ICD-10-CM | POA: Diagnosis not present

## 2015-12-13 DIAGNOSIS — N2581 Secondary hyperparathyroidism of renal origin: Secondary | ICD-10-CM | POA: Diagnosis not present

## 2015-12-13 DIAGNOSIS — E089 Diabetes mellitus due to underlying condition without complications: Secondary | ICD-10-CM | POA: Diagnosis not present

## 2015-12-13 DIAGNOSIS — D631 Anemia in chronic kidney disease: Secondary | ICD-10-CM | POA: Diagnosis not present

## 2015-12-13 DIAGNOSIS — N186 End stage renal disease: Secondary | ICD-10-CM | POA: Diagnosis not present

## 2015-12-13 DIAGNOSIS — D509 Iron deficiency anemia, unspecified: Secondary | ICD-10-CM | POA: Diagnosis not present

## 2015-12-13 LAB — CULTURE, BLOOD (ROUTINE X 2)
CULTURE: NO GROWTH
CULTURE: NO GROWTH

## 2015-12-14 ENCOUNTER — Encounter: Payer: Self-pay | Admitting: Internal Medicine

## 2015-12-14 ENCOUNTER — Encounter: Payer: Self-pay | Admitting: Gastroenterology

## 2015-12-14 ENCOUNTER — Ambulatory Visit (INDEPENDENT_AMBULATORY_CARE_PROVIDER_SITE_OTHER): Payer: Medicare Other | Admitting: Gastroenterology

## 2015-12-14 ENCOUNTER — Ambulatory Visit (INDEPENDENT_AMBULATORY_CARE_PROVIDER_SITE_OTHER): Payer: Medicare Other | Admitting: Internal Medicine

## 2015-12-14 VITALS — BP 184/96 | HR 76 | Temp 98.0°F | Ht 67.0 in | Wt 233.0 lb

## 2015-12-14 VITALS — BP 180/60 | HR 88 | Ht 67.0 in | Wt 232.0 lb

## 2015-12-14 DIAGNOSIS — I10 Essential (primary) hypertension: Secondary | ICD-10-CM

## 2015-12-14 DIAGNOSIS — Z992 Dependence on renal dialysis: Secondary | ICD-10-CM

## 2015-12-14 DIAGNOSIS — N186 End stage renal disease: Secondary | ICD-10-CM | POA: Diagnosis not present

## 2015-12-14 DIAGNOSIS — R110 Nausea: Secondary | ICD-10-CM | POA: Diagnosis not present

## 2015-12-14 DIAGNOSIS — D631 Anemia in chronic kidney disease: Secondary | ICD-10-CM

## 2015-12-14 DIAGNOSIS — G44209 Tension-type headache, unspecified, not intractable: Secondary | ICD-10-CM

## 2015-12-14 DIAGNOSIS — D696 Thrombocytopenia, unspecified: Secondary | ICD-10-CM

## 2015-12-14 DIAGNOSIS — R609 Edema, unspecified: Secondary | ICD-10-CM | POA: Diagnosis not present

## 2015-12-14 DIAGNOSIS — J189 Pneumonia, unspecified organism: Secondary | ICD-10-CM | POA: Diagnosis not present

## 2015-12-14 DIAGNOSIS — E1121 Type 2 diabetes mellitus with diabetic nephropathy: Secondary | ICD-10-CM

## 2015-12-14 DIAGNOSIS — R197 Diarrhea, unspecified: Secondary | ICD-10-CM | POA: Diagnosis not present

## 2015-12-14 MED ORDER — TRAMADOL HCL 50 MG PO TABS: ORAL_TABLET | ORAL | 3 refills | Status: DC

## 2015-12-14 MED ORDER — METOCLOPRAMIDE HCL 10 MG PO TABS: ORAL_TABLET | ORAL | 5 refills | Status: DC

## 2015-12-14 MED ORDER — PROMETHAZINE HCL 25 MG PO TABS: 25.0000 mg | ORAL_TABLET | Freq: Three times a day (TID) | ORAL | 0 refills | Status: DC | PRN

## 2015-12-14 NOTE — Progress Notes (Signed)
Facility  [facility]    Place of Service:   OFFICE    Allergies  Allergen Reactions  . Codeine Nausea And Vomiting and Nausea Only    Chief Complaint  Patient presents with  . Hospitalization Follow-up    TOS hospitalization 12/07/15 to 12/11/15 Pneumonia    HPI:  Hospitalized 12/07/15 to 12/11/15. Had bilateral HCAP. Treated with Levaquin. Had respiratory failure with hypoxia on entry to hospital.  ESRD continues  BP OK in hospital, but today's BP is quite high. Current meds include amlodipine, Coreg, doxazosin, and hydralazine.  Having nausea. Saw Dr. Ardis Hughs this morning. Stools are firm. Had loose stools prior to hospitalization. Using omeprazole without relief. No vomiting. Normal gastric emptying in 2011.   Medications: Patient's Medications  New Prescriptions   No medications on file  Previous Medications   ACETAMINOPHEN (TYLENOL) 500 MG TABLET    Take 1 tablet (500 mg total) by mouth every 6 (six) hours as needed for moderate pain.   AMLODIPINE (NORVASC) 10 MG TABLET    Take 1 tablet (10 mg total) by mouth daily.   ASPIRIN EC 81 MG TABLET    Take 81 mg by mouth daily.   B COMPLEX-VITAMIN C-FOLIC ACID (NEPHRO-VITE) 0.8 MG TABS TABLET    Take 1 tablet by mouth daily.   BUDESONIDE-FORMOTEROL (SYMBICORT) 80-4.5 MCG/ACT INHALER    Inhale 2 puffs into the lungs 2 (two) times daily.   CALCITRIOL (ROCALTROL) 0.25 MCG CAPSULE    Take 1 capsule (0.25 mcg total) by mouth every Monday, Wednesday, and Friday with hemodialysis.   CALCIUM ACETATE, PHOS BINDER, (PHOSLYRA) 667 MG/5ML SOLN    Take 2,001 mg by mouth 3 (three) times daily with meals. And 667 mg with snack   CARVEDILOL (COREG) 25 MG TABLET    Take 1 tablet (25 mg total) by mouth 2 (two) times daily. Take one tablet in the morning and one tablet in the evening   CHOLECALCIFEROL (VITAMIN D3) 5000 UNITS TABS    Take 1 tablet by mouth daily.   DIPHENHYDRAMINE (BENADRYL) 25 MG TABLET    Take 50 mg by mouth 2 (two) times daily.      DOXAZOSIN (CARDURA) 4 MG TABLET    Take 0.5 tablet  by mouth in the am and 0.5 tablet by mouth in the pm to regulate blood pressure.   GUAIFENESIN-DEXTROMETHORPHAN (ROBITUSSIN DM) 100-10 MG/5ML SYRUP    Take 10 mLs by mouth every 4 (four) hours as needed for cough.   HYDRALAZINE (APRESOLINE) 25 MG TABLET    Take 1 tablet (25 mg total) by mouth 3 (three) times daily.   IPRATROPIUM-ALBUTEROL (DUONEB) 0.5-2.5 (3) MG/3ML SOLN    Take 3 mLs by nebulization 3 (three) times daily. ICD 10 code J44.9   LEVOFLOXACIN (LEVAQUIN) 500 MG TABLET    Take 1 tablet (500 mg total) by mouth every other day.   OMEPRAZOLE (PRILOSEC) 20 MG CAPSULE    TAKE 1 CAPSULE (20 MG TOTAL) BY MOUTH 2 (TWO) TIMES DAILY BEFORE A MEAL.   PRAVASTATIN (PRAVACHOL) 40 MG TABLET    Take 1 tablet (40 mg total) by mouth every evening.  Modified Medications   Modified Medication Previous Medication   TRAMADOL (ULTRAM) 50 MG TABLET traMADol (ULTRAM) 50 MG tablet      One up to 4 times daly if needed for pain    One up to 4 times daly if needed for pain  Discontinued Medications   No medications on file  Review of Systems  Constitutional: Positive for fatigue. Negative for fever.       Chronically fatigued and sleepy.  HENT: Positive for ear discharge and hearing loss. Negative for ear pain and tinnitus.   Eyes: Negative.   Respiratory: Negative for shortness of breath.        Dyspnea on exertion has improved. Pneumonia. Hospitalized 04/06/14 and 06/23/2014. Rehospitalized 09/18/2014 for increasing dyspnea felt secondary to acute exacerbation of his COPD plus fluid overload. Re hospitalized 12/07/15 to 12/11/15 for bilateral HCAP.  Cardiovascular: Positive for palpitations. Negative for chest pain and leg swelling.  Gastrointestinal: Negative for abdominal distention and abdominal pain.       Cholecystectomy 10/21/2014.  Endocrine:       Diabetes is under better control.  Genitourinary: Negative.        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  Musculoskeletal:       Neck discomfort. Chronic right knee discomfort. Chronic back pains. Unsteady gait.  Skin:       Tissue fibrosis at the laparoscopic cholecystectomy incisions in the right abdomen and maple.  Neurological: Positive for dizziness.       Memory impairment is stable. Feet feel numb. Recurrent headaches  Hematological:       Anemia of CKD.  Psychiatric/Behavioral: Positive for confusion and decreased concentration.       Insomnia. Falls asleep in the day.    Vitals:   12/14/15 1520  BP: (!) 184/96  Pulse: 76  Temp: 98 F (36.7 C)  TempSrc: Oral  SpO2: 98%  Weight: 233 lb (105.7 kg)  Height: '5\' 7"'$  (1.702 m)   Body mass index is 36.49 kg/m. Wt Readings from Last 3 Encounters:  12/14/15 233 lb (105.7 kg)  12/14/15 232 lb (105.2 kg)  12/10/15 225 lb 1.4 oz (102.1 kg)      Physical Exam  Constitutional: He is oriented to person, place, and time.  Obese.  HENT:  Loss of hearing.  Eyes:  Corrective lenses  Neck: No JVD present. No tracheal deviation present. No thyromegaly present.  Cardiovascular: Exam reveals no gallop.   No murmur heard. AF. 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  Abdominal: Soft. Bowel sounds are normal. He exhibits no distension and no mass. There is no tenderness.  Tender in the RUQ. Frequent nausea.  Musculoskeletal: Normal range of motion. He exhibits tenderness. He exhibits no edema.  Scar left knee. Tender right knee  Lymphadenopathy:    He has no cervical adenopathy.  Neurological: He is alert and oriented to person, place, and time. No cranial nerve deficit. Coordination normal.  Positive Tinel sign bilaterally  Skin: No rash noted. No erythema. No pallor.  Tissue fibrosis at cholecystectomy incisions in the right abdomen and umbilicus. Seborrheic nonhealing lesion of the left forearm. 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  Psychiatric:  Drowsy during the  exam.    Labs reviewed: Lab Summary Latest Ref Rng & Units 12/11/2015 12/10/2015 12/09/2015 12/09/2015  Hemoglobin 13.0 - 17.0 g/dL 9.8(L) 9.6(L) 9.4(L) 8.8(L)  Hematocrit 39.0 - 52.0 % 29.4(L) 28.7(L) 29.2(L) 27.8(L)  White count 4.0 - 10.5 K/uL 8.4 5.3 6.2 5.3  Platelet count 150 - 400 K/uL 109(L) 85(L) 70(L) 69(L)  Sodium 135 - 145 mmol/L 134(L) 136 136 137  Potassium 3.5 - 5.1 mmol/L 5.0 4.2 4.5 4.6  Calcium 8.9 - 10.3 mg/dL 9.6 9.2 9.1 9.1  Phosphorus 2.5 - 4.6 mg/dL 3.0 1.9(L) 5.1(H) 5.2(H)  Creatinine  0.61 - 1.24 mg/dL 12.38(H) 9.42(H) 13.30(H) 12.08(H)  AST - (None) (None) (None) (None)  Alk Phos - (None) (None) (None) (None)  Bilirubin - (None) (None) (None) (None)  Glucose 65 - 99 mg/dL 164(H) 230(H) 133(H) 114(H)  Cholesterol - (None) (None) (None) (None)  HDL cholesterol - (None) (None) (None) (None)  Triglycerides - (None) (None) (None) (None)  LDL Direct - (None) (None) (None) (None)  LDL Calc - (None) (None) (None) (None)  Total protein - (None) (None) (None) (None)  Albumin 3.5 - 5.0 g/dL 3.8 3.5 3.7 3.4(L)  Some recent data might be hidden   Lab Results  Component Value Date   TSH 3.480 06/29/2015   TSH 2.466 *Test methodology is 3rd generation TS** 12/17/2006   TSH 2.771 *Test methodology is 3rd generation TSH* 12/13/2006   Lab Results  Component Value Date   BUN 70 (H) 12/11/2015   BUN 36 (H) 12/10/2015   BUN 38 (H) 12/09/2015   Lab Results  Component Value Date   HGBA1C 6.0 (H) 06/29/2015   HGBA1C 6.5 02/09/2015   HGBA1C 6.7 (H) 09/14/2014    Assessment/Plan  1. Tension headache - traMADol (ULTRAM) 50 MG tablet; One up to 4 times daly if needed for pain  Dispense: 100 tablet; Refill: 3  2. HCAP (healthcare-associated pneumonia) resolved  3. Nausea without vomiting - metoCLOPramide (REGLAN) 10 MG tablet; One before each meal to increase stomach emptying  Dispense: 90 tablet; Refill: 5 - promethazine (PHENERGAN) 25 MG tablet; Take 1 tablet (25  mg total) by mouth every 8 (eight) hours as needed for nausea or vomiting.  Dispense: 20 tablet; Refill: 0  4. Anemia in chronic kidney disease, on chronic dialysis (HCC) - CBC with Differential/Platelet; Future - Reticulocytes; Future  5. Type 2 diabetes mellitus with diabetic nephropathy, without long-term current use of insulin (HCC) - Comprehensive metabolic panel; Future - Hemoglobin A1c; Future - Microalbumin, urine; Future  6. Edema, unspecified type Trace. Improved  7. Essential hypertension Elevated today  8. End stage renal disease ([place]) Continue dialysis  9. Thrombocytopenia ([place]) 109,000 on latest lab

## 2015-12-14 NOTE — Patient Instructions (Addendum)
If you are age 53 or older, your body mass index should be between 23-30. Your Body mass index is 36.34 kg/m. If this is out of the aforementioned range listed, please consider follow up with your Primary Care Provider.  If you are age 53 or younger, your body mass index should be between 19-25. Your Body mass index is 36.34 kg/m. If this is out of the aformentioned range listed, please consider follow up with your Primary Care Provider.   Please return to see Dr. Ardis Hughs in 3 months.

## 2015-12-14 NOTE — Progress Notes (Signed)
Review of pertinent gastrointestinal problems: 1. Personal history of tubular adenomas, colonoscopy May 2009. Recommended to have repeat colonoscopy at 3 year interval. A reminder letter was sent in 2012.  Colonoscopy 06/2011 Dr. Ardis Hughs, two subcentimeter polyps removed, were adenomatous.  Recommended recall at 5 year interval. 2. new anemia prompted EGD October 2009. Mild gastritis, duodenitis H. pylori positive on biopsy. 3. C diff TOXIN + diarrhea: 09/2015  HPI: This is a  pleasant 55 year old man who is here with his wife and daughter today.  Chief complaint is recent pneumonia, recent Clostridium difficile associated diarrhea  He was here in the office 2 months ago and he saw [employer]. He is complaining of urgent watery diarrhea, intermittent bright red blood per rectum, bloating, reflux symptoms.  Stool pathogen panel was performed and he was found to be positive for Clostridium difficile. He was put on vancomycin orally.  His diarrhea improved.  A lot of nausea.  His cough is not gone, he is breathing easier.  Diarrhea is completely improved.  Since then he was admitted for 3 night stay for pneumonia.  He was discharged 3 days ago (fever, cp, cough, sob), CXR showed bilateral pneumonia; discharged on levaquin.  ROS: complete GI ROS as described in HPI.  Constitutional:  No unintentional weight loss   Past Medical History:  Diagnosis Date  . Acute edema of lung, unspecified   . Acute, but ill-defined, cerebrovascular disease   . 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)   . Anxiety   . Asthma   . Carpal tunnel syndrome   . Cellulitis and abscess of trunk   . Cholelithiasis 07/13/2014  . Chronic headaches   . Debility, unspecified   . Dermatophytosis of the body   . Dysrhythmia    history of  . Edema   . End stage renal disease on dialysis [facility])    "MWF; [facility]" (10/21/2014)  . Essential hypertension, benign   . Gout, unspecified   . HTN  (hypertension)   . Hypertrophy of prostate without urinary obstruction and other lower urinary tract symptoms (LUTS)   . Hypotension, unspecified   . Impotence of organic origin   . Insomnia, unspecified   . Kidney replaced by transplant   . Localization-related (focal) (partial) epilepsy and epileptic syndromes with complex partial seizures, without mention of intractable epilepsy   . Lumbago   . Memory loss   . OSA on CPAP   . Other and unspecified hyperlipidemia   . Other chronic nonalcoholic liver disease   . Other malaise and fatigue   . Other nonspecific abnormal serum enzyme levels   . Pain in joint, lower leg   . Pain in joint, upper arm   . Pneumonia "several times"  . Renal dialysis status(V45.11) 02/05/2010   restarted 01/02/13 ofter renal trransplant failure  . Secondary hyperparathyroidism (of renal origin)   . Shortness of breath   . Tension headache   . Unspecified constipation   . Unspecified essential hypertension   . Unspecified hereditary and idiopathic peripheral neuropathy   . Unspecified vitamin D deficiency     Past Surgical History:  Procedure Laterality Date  . AV FISTULA PLACEMENT Left ?2010   "forearm; at [facility]"  . BACK SURGERY    . CARDIAC CATHETERIZATION  03/21/2011  . CHOLECYSTECTOMY N/A 10/21/2014   Procedure: LAPAROSCOPIC CHOLECYSTECTOMY WITH INTRAOPERATIVE CHOLANGIOGRAM;  Surgeon: Autumn Messing III, MD;  Location: [facility];  Service: General;  Laterality: N/A;  . COLONOSCOPY    .  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 KIDNEY TRANSPLANT  08/17/2011   [place]  10/21/2014   w/IOC  . LEFT HEART CATHETERIZATION WITH CORONARY ANGIOGRAM N/A 03/21/2011   Procedure: LEFT HEART CATHETERIZATION WITH CORONARY ANGIOGRAM;  Surgeon: [name], MD;  Location: [facility] CATH LAB;  Service: Cardiovascular;  Laterality: N/A;  . NEPHRECTOMY  08/2013   removed transplaned kidney  . POSTERIOR FUSION  CERVICAL SPINE  06/25/2012   for spinal stenosis  . VASECTOMY  2010    Current Outpatient Prescriptions  Medication Sig Dispense Refill  . acetaminophen (TYLENOL) 500 MG tablet Take 1 tablet (500 mg total) by mouth every 6 (six) hours as needed for moderate pain.    Marland Kitchen amLODipine (NORVASC) 10 MG tablet Take 1 tablet (10 mg total) by mouth daily. 90 tablet 1  . aspirin EC 81 MG tablet Take 81 mg by mouth daily.    Marland Kitchen b complex-vitamin c-folic acid (NEPHRO-VITE) 0.8 MG TABS tablet Take 1 tablet by mouth daily. 90 tablet 3  . budesonide-formoterol (SYMBICORT) 80-4.5 MCG/ACT inhaler Inhale 2 puffs into the lungs 2 (two) times daily. (Patient taking differently: Inhale 2 puffs into the lungs 2 (two) times daily as needed. ) 1 Inhaler 1  . calcitRIOL (ROCALTROL) 0.25 MCG capsule Take 1 capsule (0.25 mcg total) by mouth every Monday, Wednesday, and Friday with hemodialysis. 30 capsule 1  . calcium acetate, Phos Binder, (PHOSLYRA) 667 MG/5ML SOLN Take 2,001 mg by mouth 3 (three) times daily with meals. And 667 mg with snack    . carvedilol (COREG) 25 MG tablet Take 1 tablet (25 mg total) by mouth 2 (two) times daily. Take one tablet in the morning and one tablet in the evening 180 tablet 1  . Cholecalciferol (VITAMIN D3) 5000 UNITS TABS Take 1 tablet by mouth daily.    . diphenhydrAMINE (BENADRYL) 25 MG tablet Take 50 mg by mouth 2 (two) times daily.     Marland Kitchen doxazosin (CARDURA) 4 MG tablet Take 0.5 tablet  by mouth in the am and 0.5 tablet by mouth in the pm to regulate blood pressure. 90 tablet 1  . guaiFENesin-dextromethorphan (ROBITUSSIN DM) 100-10 MG/5ML syrup Take 10 mLs by mouth every 4 (four) hours as needed for cough.    . hydrALAZINE (APRESOLINE) 25 MG tablet Take 1 tablet (25 mg total) by mouth 3 (three) times daily. 90 tablet 0  . ipratropium-albuterol (DUONEB) 0.5-2.5 (3) MG/3ML SOLN Take 3 mLs by nebulization 3 (three) times daily. ICD 10 code J44.9 360 mL 0  . levofloxacin (LEVAQUIN) 500 MG  tablet Take 1 tablet (500 mg total) by mouth every other day. 2 tablet 0  . omeprazole (PRILOSEC) 20 MG capsule TAKE 1 CAPSULE (20 MG TOTAL) BY MOUTH 2 (TWO) TIMES DAILY BEFORE A MEAL. 60 capsule 0  . pravastatin (PRAVACHOL) 40 MG tablet Take 1 tablet (40 mg total) by mouth every evening. 90 tablet 1  . traMADol (ULTRAM) 50 MG tablet One up to 4 times daly if needed for pain 100 tablet 3   No current facility-administered medications for this visit.     Allergies as of 12/14/2015 - Review Complete 12/14/2015  Allergen Reaction Noted  . Codeine Nausea And Vomiting and Nausea Only 05/20/2007    Family History  Problem Relation Age of Onset  . Adopted: Yes  . Colon cancer Neg Hx   . Esophageal cancer Neg Hx   . Rectal cancer Neg Hx   .  Stomach cancer Neg Hx     Social History   Social History  . Marital status: Married    Spouse name: [name]  . Number of children: 3  . Years of education: N/A   Occupational History  . disabled Disabled   Social History Main Topics  . Smoking status: Former Smoker    Packs/day: 0.50    Years: 32.00    Types: Cigarettes    Quit date: 08/23/2014  . Smokeless tobacco: Never Used  . Alcohol use No  . Drug use: No  . Sexual activity: Yes   Other Topics Concern  . Not on file   Social History Narrative   Drinks 1 cup of caffeine daily.     Physical Exam: BP (!) 180/60 (BP Location: Right Arm, Patient Position: Sitting, Cuff Size: Normal)   Pulse 88   Ht 5\' 7"  (1.702 m)   Wt 232 lb (105.2 kg)   BMI 36.34 kg/m  Constitutional: Obese, foul-smelling, fatigued appearing but breathing comfortably Psychiatric: alert and oriented x3 Lungs are clear to auscultation bilaterally Abdomen: soft, nontender, nondistended, no obvious ascites, no peritoneal signs, normal bowel sounds No peripheral edema noted in lower extremities  Assessment and plan: 55 y.o. male with recovered from Clostridium difficile associated diarrhea, recently discharged  with pneumonia  He is very fatigued and still has productive cough but he is breathing comfortably and I think he is just slowly recovering from his recent hospital admission, bilateral pneumonia. He is on Levaquin for that. I think that is making him a bit nauseous but he is not vomiting fortunately. The diarrhea that he had when he was diagnosed with Clostridium difficile toxin positive has significantly improved using a bit constipated earlier this week. From a GI perspective he is okay to send to return here in 3 months. He is seeing his primary care physician for hospital follow-up later today.   Owens Loffler, MD [facility] 12/14/2015, 9:30 AM

## 2015-12-15 ENCOUNTER — Other Ambulatory Visit: Payer: Self-pay | Admitting: Internal Medicine

## 2015-12-15 DIAGNOSIS — E785 Hyperlipidemia, unspecified: Secondary | ICD-10-CM

## 2015-12-15 DIAGNOSIS — I10 Essential (primary) hypertension: Secondary | ICD-10-CM

## 2015-12-16 DIAGNOSIS — D631 Anemia in chronic kidney disease: Secondary | ICD-10-CM | POA: Diagnosis not present

## 2015-12-16 DIAGNOSIS — D509 Iron deficiency anemia, unspecified: Secondary | ICD-10-CM | POA: Diagnosis not present

## 2015-12-16 DIAGNOSIS — N186 End stage renal disease: Secondary | ICD-10-CM | POA: Diagnosis not present

## 2015-12-16 DIAGNOSIS — N2581 Secondary hyperparathyroidism of renal origin: Secondary | ICD-10-CM | POA: Diagnosis not present

## 2015-12-16 DIAGNOSIS — E089 Diabetes mellitus due to underlying condition without complications: Secondary | ICD-10-CM | POA: Diagnosis not present

## 2015-12-19 DIAGNOSIS — D509 Iron deficiency anemia, unspecified: Secondary | ICD-10-CM | POA: Diagnosis not present

## 2015-12-19 DIAGNOSIS — N186 End stage renal disease: Secondary | ICD-10-CM | POA: Diagnosis not present

## 2015-12-19 DIAGNOSIS — E089 Diabetes mellitus due to underlying condition without complications: Secondary | ICD-10-CM | POA: Diagnosis not present

## 2015-12-19 DIAGNOSIS — D631 Anemia in chronic kidney disease: Secondary | ICD-10-CM | POA: Diagnosis not present

## 2015-12-19 DIAGNOSIS — N2581 Secondary hyperparathyroidism of renal origin: Secondary | ICD-10-CM | POA: Diagnosis not present

## 2015-12-21 DIAGNOSIS — E089 Diabetes mellitus due to underlying condition without complications: Secondary | ICD-10-CM | POA: Diagnosis not present

## 2015-12-21 DIAGNOSIS — D631 Anemia in chronic kidney disease: Secondary | ICD-10-CM | POA: Diagnosis not present

## 2015-12-21 DIAGNOSIS — D509 Iron deficiency anemia, unspecified: Secondary | ICD-10-CM | POA: Diagnosis not present

## 2015-12-21 DIAGNOSIS — N2581 Secondary hyperparathyroidism of renal origin: Secondary | ICD-10-CM | POA: Diagnosis not present

## 2015-12-21 DIAGNOSIS — N186 End stage renal disease: Secondary | ICD-10-CM | POA: Diagnosis not present

## 2015-12-22 DIAGNOSIS — Z992 Dependence on renal dialysis: Secondary | ICD-10-CM | POA: Diagnosis not present

## 2015-12-22 DIAGNOSIS — T8612 Kidney transplant failure: Secondary | ICD-10-CM | POA: Diagnosis not present

## 2015-12-22 DIAGNOSIS — N186 End stage renal disease: Secondary | ICD-10-CM | POA: Diagnosis not present

## 2015-12-23 DIAGNOSIS — D631 Anemia in chronic kidney disease: Secondary | ICD-10-CM | POA: Diagnosis not present

## 2015-12-23 DIAGNOSIS — N186 End stage renal disease: Secondary | ICD-10-CM | POA: Diagnosis not present

## 2015-12-23 DIAGNOSIS — E089 Diabetes mellitus due to underlying condition without complications: Secondary | ICD-10-CM | POA: Diagnosis not present

## 2015-12-23 DIAGNOSIS — N2581 Secondary hyperparathyroidism of renal origin: Secondary | ICD-10-CM | POA: Diagnosis not present

## 2015-12-23 DIAGNOSIS — D509 Iron deficiency anemia, unspecified: Secondary | ICD-10-CM | POA: Diagnosis not present

## 2015-12-26 DIAGNOSIS — N2581 Secondary hyperparathyroidism of renal origin: Secondary | ICD-10-CM | POA: Diagnosis not present

## 2015-12-26 DIAGNOSIS — N186 End stage renal disease: Secondary | ICD-10-CM | POA: Diagnosis not present

## 2015-12-26 DIAGNOSIS — E089 Diabetes mellitus due to underlying condition without complications: Secondary | ICD-10-CM | POA: Diagnosis not present

## 2015-12-26 DIAGNOSIS — D509 Iron deficiency anemia, unspecified: Secondary | ICD-10-CM | POA: Diagnosis not present

## 2015-12-26 DIAGNOSIS — D631 Anemia in chronic kidney disease: Secondary | ICD-10-CM | POA: Diagnosis not present

## 2015-12-27 ENCOUNTER — Telehealth: Payer: Self-pay | Admitting: Internal Medicine

## 2015-12-27 NOTE — Telephone Encounter (Signed)
left msg asking pt to confirm this AWV appt w/ nurse. VDM (DD) °

## 2015-12-28 DIAGNOSIS — N186 End stage renal disease: Secondary | ICD-10-CM | POA: Diagnosis not present

## 2015-12-28 DIAGNOSIS — N2581 Secondary hyperparathyroidism of renal origin: Secondary | ICD-10-CM | POA: Diagnosis not present

## 2015-12-28 DIAGNOSIS — D509 Iron deficiency anemia, unspecified: Secondary | ICD-10-CM | POA: Diagnosis not present

## 2015-12-28 DIAGNOSIS — D631 Anemia in chronic kidney disease: Secondary | ICD-10-CM | POA: Diagnosis not present

## 2015-12-28 DIAGNOSIS — E089 Diabetes mellitus due to underlying condition without complications: Secondary | ICD-10-CM | POA: Diagnosis not present

## 2015-12-30 DIAGNOSIS — E089 Diabetes mellitus due to underlying condition without complications: Secondary | ICD-10-CM | POA: Diagnosis not present

## 2015-12-30 DIAGNOSIS — D631 Anemia in chronic kidney disease: Secondary | ICD-10-CM | POA: Diagnosis not present

## 2015-12-30 DIAGNOSIS — N186 End stage renal disease: Secondary | ICD-10-CM | POA: Diagnosis not present

## 2015-12-30 DIAGNOSIS — N2581 Secondary hyperparathyroidism of renal origin: Secondary | ICD-10-CM | POA: Diagnosis not present

## 2015-12-30 DIAGNOSIS — D509 Iron deficiency anemia, unspecified: Secondary | ICD-10-CM | POA: Diagnosis not present

## 2015-12-30 DIAGNOSIS — R300 Dysuria: Secondary | ICD-10-CM | POA: Diagnosis not present

## 2016-01-02 DIAGNOSIS — E089 Diabetes mellitus due to underlying condition without complications: Secondary | ICD-10-CM | POA: Diagnosis not present

## 2016-01-02 DIAGNOSIS — N186 End stage renal disease: Secondary | ICD-10-CM | POA: Diagnosis not present

## 2016-01-02 DIAGNOSIS — D631 Anemia in chronic kidney disease: Secondary | ICD-10-CM | POA: Diagnosis not present

## 2016-01-02 DIAGNOSIS — N2581 Secondary hyperparathyroidism of renal origin: Secondary | ICD-10-CM | POA: Diagnosis not present

## 2016-01-02 DIAGNOSIS — D509 Iron deficiency anemia, unspecified: Secondary | ICD-10-CM | POA: Diagnosis not present

## 2016-01-04 DIAGNOSIS — D631 Anemia in chronic kidney disease: Secondary | ICD-10-CM | POA: Diagnosis not present

## 2016-01-04 DIAGNOSIS — D509 Iron deficiency anemia, unspecified: Secondary | ICD-10-CM | POA: Diagnosis not present

## 2016-01-04 DIAGNOSIS — E089 Diabetes mellitus due to underlying condition without complications: Secondary | ICD-10-CM | POA: Diagnosis not present

## 2016-01-04 DIAGNOSIS — N2581 Secondary hyperparathyroidism of renal origin: Secondary | ICD-10-CM | POA: Diagnosis not present

## 2016-01-04 DIAGNOSIS — N186 End stage renal disease: Secondary | ICD-10-CM | POA: Diagnosis not present

## 2016-01-06 DIAGNOSIS — N186 End stage renal disease: Secondary | ICD-10-CM | POA: Diagnosis not present

## 2016-01-06 DIAGNOSIS — D631 Anemia in chronic kidney disease: Secondary | ICD-10-CM | POA: Diagnosis not present

## 2016-01-06 DIAGNOSIS — D509 Iron deficiency anemia, unspecified: Secondary | ICD-10-CM | POA: Diagnosis not present

## 2016-01-06 DIAGNOSIS — E089 Diabetes mellitus due to underlying condition without complications: Secondary | ICD-10-CM | POA: Diagnosis not present

## 2016-01-06 DIAGNOSIS — N2581 Secondary hyperparathyroidism of renal origin: Secondary | ICD-10-CM | POA: Diagnosis not present

## 2016-01-06 NOTE — Telephone Encounter (Signed)
left another msg asking pt to confirm this AWV appt w/ nurse. VDM (DD)

## 2016-01-09 DIAGNOSIS — N2581 Secondary hyperparathyroidism of renal origin: Secondary | ICD-10-CM | POA: Diagnosis not present

## 2016-01-09 DIAGNOSIS — D509 Iron deficiency anemia, unspecified: Secondary | ICD-10-CM | POA: Diagnosis not present

## 2016-01-09 DIAGNOSIS — E089 Diabetes mellitus due to underlying condition without complications: Secondary | ICD-10-CM | POA: Diagnosis not present

## 2016-01-09 DIAGNOSIS — N186 End stage renal disease: Secondary | ICD-10-CM | POA: Diagnosis not present

## 2016-01-09 DIAGNOSIS — D631 Anemia in chronic kidney disease: Secondary | ICD-10-CM | POA: Diagnosis not present

## 2016-01-11 DIAGNOSIS — E089 Diabetes mellitus due to underlying condition without complications: Secondary | ICD-10-CM | POA: Diagnosis not present

## 2016-01-11 DIAGNOSIS — D631 Anemia in chronic kidney disease: Secondary | ICD-10-CM | POA: Diagnosis not present

## 2016-01-11 DIAGNOSIS — N186 End stage renal disease: Secondary | ICD-10-CM | POA: Diagnosis not present

## 2016-01-11 DIAGNOSIS — D509 Iron deficiency anemia, unspecified: Secondary | ICD-10-CM | POA: Diagnosis not present

## 2016-01-11 DIAGNOSIS — N2581 Secondary hyperparathyroidism of renal origin: Secondary | ICD-10-CM | POA: Diagnosis not present

## 2016-01-13 DIAGNOSIS — N2581 Secondary hyperparathyroidism of renal origin: Secondary | ICD-10-CM | POA: Diagnosis not present

## 2016-01-13 DIAGNOSIS — N186 End stage renal disease: Secondary | ICD-10-CM | POA: Diagnosis not present

## 2016-01-13 DIAGNOSIS — E089 Diabetes mellitus due to underlying condition without complications: Secondary | ICD-10-CM | POA: Diagnosis not present

## 2016-01-13 DIAGNOSIS — D631 Anemia in chronic kidney disease: Secondary | ICD-10-CM | POA: Diagnosis not present

## 2016-01-13 DIAGNOSIS — D509 Iron deficiency anemia, unspecified: Secondary | ICD-10-CM | POA: Diagnosis not present

## 2016-01-15 DIAGNOSIS — D631 Anemia in chronic kidney disease: Secondary | ICD-10-CM | POA: Diagnosis not present

## 2016-01-15 DIAGNOSIS — E089 Diabetes mellitus due to underlying condition without complications: Secondary | ICD-10-CM | POA: Diagnosis not present

## 2016-01-15 DIAGNOSIS — N2581 Secondary hyperparathyroidism of renal origin: Secondary | ICD-10-CM | POA: Diagnosis not present

## 2016-01-15 DIAGNOSIS — D509 Iron deficiency anemia, unspecified: Secondary | ICD-10-CM | POA: Diagnosis not present

## 2016-01-15 DIAGNOSIS — N186 End stage renal disease: Secondary | ICD-10-CM | POA: Diagnosis not present

## 2016-01-18 DIAGNOSIS — N186 End stage renal disease: Secondary | ICD-10-CM | POA: Diagnosis not present

## 2016-01-18 DIAGNOSIS — D631 Anemia in chronic kidney disease: Secondary | ICD-10-CM | POA: Diagnosis not present

## 2016-01-18 DIAGNOSIS — D509 Iron deficiency anemia, unspecified: Secondary | ICD-10-CM | POA: Diagnosis not present

## 2016-01-18 DIAGNOSIS — N2581 Secondary hyperparathyroidism of renal origin: Secondary | ICD-10-CM | POA: Diagnosis not present

## 2016-01-18 DIAGNOSIS — E089 Diabetes mellitus due to underlying condition without complications: Secondary | ICD-10-CM | POA: Diagnosis not present

## 2016-01-19 DIAGNOSIS — R8279 Other abnormal findings on microbiological examination of urine: Secondary | ICD-10-CM | POA: Diagnosis not present

## 2016-01-19 DIAGNOSIS — N200 Calculus of kidney: Secondary | ICD-10-CM | POA: Diagnosis not present

## 2016-01-19 DIAGNOSIS — N281 Cyst of kidney, acquired: Secondary | ICD-10-CM | POA: Diagnosis not present

## 2016-01-19 DIAGNOSIS — R310 Gross hematuria: Secondary | ICD-10-CM | POA: Diagnosis not present

## 2016-01-19 DIAGNOSIS — N271 Small kidney, bilateral: Secondary | ICD-10-CM | POA: Diagnosis not present

## 2016-01-20 DIAGNOSIS — E089 Diabetes mellitus due to underlying condition without complications: Secondary | ICD-10-CM | POA: Diagnosis not present

## 2016-01-20 DIAGNOSIS — D509 Iron deficiency anemia, unspecified: Secondary | ICD-10-CM | POA: Diagnosis not present

## 2016-01-20 DIAGNOSIS — N186 End stage renal disease: Secondary | ICD-10-CM | POA: Diagnosis not present

## 2016-01-20 DIAGNOSIS — D631 Anemia in chronic kidney disease: Secondary | ICD-10-CM | POA: Diagnosis not present

## 2016-01-20 DIAGNOSIS — N2581 Secondary hyperparathyroidism of renal origin: Secondary | ICD-10-CM | POA: Diagnosis not present

## 2016-01-22 DIAGNOSIS — E089 Diabetes mellitus due to underlying condition without complications: Secondary | ICD-10-CM | POA: Diagnosis not present

## 2016-01-22 DIAGNOSIS — N2581 Secondary hyperparathyroidism of renal origin: Secondary | ICD-10-CM | POA: Diagnosis not present

## 2016-01-22 DIAGNOSIS — T8612 Kidney transplant failure: Secondary | ICD-10-CM | POA: Diagnosis not present

## 2016-01-22 DIAGNOSIS — Z992 Dependence on renal dialysis: Secondary | ICD-10-CM | POA: Diagnosis not present

## 2016-01-22 DIAGNOSIS — N186 End stage renal disease: Secondary | ICD-10-CM | POA: Diagnosis not present

## 2016-01-22 DIAGNOSIS — D631 Anemia in chronic kidney disease: Secondary | ICD-10-CM | POA: Diagnosis not present

## 2016-01-22 DIAGNOSIS — D509 Iron deficiency anemia, unspecified: Secondary | ICD-10-CM | POA: Diagnosis not present

## 2016-01-24 ENCOUNTER — Emergency Department (HOSPITAL_COMMUNITY)
Admission: EM | Admit: 2016-01-24 | Discharge: 2016-01-24 | Disposition: A | Discharge status: Home / Self Care | Payer: Medicare Other | Attending: Emergency Medicine | Admitting: Emergency Medicine

## 2016-01-24 ENCOUNTER — Emergency Department (HOSPITAL_COMMUNITY): Payer: Medicare Other

## 2016-01-24 ENCOUNTER — Encounter (HOSPITAL_COMMUNITY): Payer: Self-pay

## 2016-01-24 DIAGNOSIS — Z79899 Other long term (current) drug therapy: Secondary | ICD-10-CM | POA: Diagnosis not present

## 2016-01-24 DIAGNOSIS — J45909 Unspecified asthma, uncomplicated: Secondary | ICD-10-CM | POA: Insufficient documentation

## 2016-01-24 DIAGNOSIS — E1122 Type 2 diabetes mellitus with diabetic chronic kidney disease: Secondary | ICD-10-CM | POA: Diagnosis not present

## 2016-01-24 DIAGNOSIS — Z992 Dependence on renal dialysis: Secondary | ICD-10-CM | POA: Insufficient documentation

## 2016-01-24 DIAGNOSIS — N186 End stage renal disease: Secondary | ICD-10-CM | POA: Insufficient documentation

## 2016-01-24 DIAGNOSIS — E089 Diabetes mellitus due to underlying condition without complications: Secondary | ICD-10-CM | POA: Diagnosis not present

## 2016-01-24 DIAGNOSIS — D631 Anemia in chronic kidney disease: Secondary | ICD-10-CM | POA: Diagnosis not present

## 2016-01-24 DIAGNOSIS — J441 Chronic obstructive pulmonary disease with (acute) exacerbation: Secondary | ICD-10-CM | POA: Diagnosis not present

## 2016-01-24 DIAGNOSIS — D509 Iron deficiency anemia, unspecified: Secondary | ICD-10-CM | POA: Diagnosis not present

## 2016-01-24 DIAGNOSIS — Z87891 Personal history of nicotine dependence: Secondary | ICD-10-CM | POA: Diagnosis not present

## 2016-01-24 DIAGNOSIS — I120 Hypertensive chronic kidney disease with stage 5 chronic kidney disease or end stage renal disease: Secondary | ICD-10-CM | POA: Diagnosis not present

## 2016-01-24 DIAGNOSIS — N2581 Secondary hyperparathyroidism of renal origin: Secondary | ICD-10-CM | POA: Diagnosis not present

## 2016-01-24 DIAGNOSIS — R0602 Shortness of breath: Secondary | ICD-10-CM | POA: Diagnosis not present

## 2016-01-24 DIAGNOSIS — R05 Cough: Secondary | ICD-10-CM | POA: Diagnosis not present

## 2016-01-24 DIAGNOSIS — Z7982 Long term (current) use of aspirin: Secondary | ICD-10-CM | POA: Diagnosis not present

## 2016-01-24 LAB — CBC WITH DIFFERENTIAL/PLATELET
BASOS ABS: 0.1 10*3/uL (ref 0.0–0.1)
BASOS PCT: 1 %
EOS PCT: 4 %
Eosinophils Absolute: 0.2 10*3/uL (ref 0.0–0.7)
HEMATOCRIT: 33.5 % — AB (ref 39.0–52.0)
Hemoglobin: 11.2 g/dL — ABNORMAL LOW (ref 13.0–17.0)
Lymphocytes Relative: 15 %
Lymphs Abs: 0.9 10*3/uL (ref 0.7–4.0)
MCH: 32.8 pg (ref 26.0–34.0)
MCHC: 33.4 g/dL (ref 30.0–36.0)
MCV: 98.2 fL (ref 78.0–100.0)
MONOS PCT: 10 %
Monocytes Absolute: 0.6 10*3/uL (ref 0.1–1.0)
NEUTROS ABS: 4 10*3/uL (ref 1.7–7.7)
Neutrophils Relative %: 70 %
PLATELETS: 138 10*3/uL — AB (ref 150–400)
RBC: 3.41 MIL/uL — ABNORMAL LOW (ref 4.22–5.81)
RDW: 15 % (ref 11.5–15.5)
WBC: 5.7 10*3/uL (ref 4.0–10.5)

## 2016-01-24 LAB — BASIC METABOLIC PANEL
ANION GAP: 12 (ref 5–15)
BUN: 22 mg/dL — ABNORMAL HIGH (ref 6–20)
CO2: 30 mmol/L (ref 22–32)
CREATININE: 8.99 mg/dL — AB (ref 0.61–1.24)
Calcium: 9.1 mg/dL (ref 8.9–10.3)
Chloride: 94 mmol/L — ABNORMAL LOW (ref 101–111)
GFR, EST AFRICAN AMERICAN: 7 mL/min — AB (ref >60)
GFR, EST NON AFRICAN AMERICAN: 6 mL/min — AB (ref >60)
GLUCOSE: 134 mg/dL — AB (ref 65–99)
Potassium: 3.3 mmol/L — ABNORMAL LOW (ref 3.5–5.1)
Sodium: 136 mmol/L (ref 135–145)

## 2016-01-24 LAB — BRAIN NATRIURETIC PEPTIDE: B Natriuretic Peptide: 182 pg/mL — ABNORMAL HIGH (ref 0.0–100.0)

## 2016-01-24 MED ORDER — TRAMADOL HCL 50 MG PO TABS
50.0000 mg | ORAL_TABLET | Freq: Once | ORAL | Status: AC
  Administered 2016-01-24: 50 mg via ORAL
  Filled 2016-01-24: qty 1

## 2016-01-24 MED ORDER — IPRATROPIUM-ALBUTEROL 0.5-2.5 (3) MG/3ML IN SOLN
3.0000 mL | Freq: Once | RESPIRATORY_TRACT | Status: AC
  Administered 2016-01-24: 3 mL via RESPIRATORY_TRACT
  Filled 2016-01-24: qty 3

## 2016-01-24 MED ORDER — LEVOFLOXACIN 500 MG PO TABS
500.0000 mg | ORAL_TABLET | Freq: Once | ORAL | Status: AC
  Administered 2016-01-24: 500 mg via ORAL
  Filled 2016-01-24: qty 1

## 2016-01-24 MED ORDER — METHYLPREDNISOLONE SODIUM SUCC 125 MG IJ SOLR
125.0000 mg | Freq: Once | INTRAMUSCULAR | Status: AC
  Administered 2016-01-24: 125 mg via INTRAVENOUS
  Filled 2016-01-24: qty 2

## 2016-01-24 MED ORDER — ALBUTEROL SULFATE (2.5 MG/3ML) 0.083% IN NEBU
2.5000 mg | INHALATION_SOLUTION | Freq: Once | RESPIRATORY_TRACT | Status: AC
  Administered 2016-01-24: 2.5 mg via RESPIRATORY_TRACT
  Filled 2016-01-24: qty 3

## 2016-01-24 MED ORDER — LEVOFLOXACIN 500 MG PO TABS: 500.0000 mg | ORAL_TABLET | Freq: Every day | ORAL | 0 refills | Status: DC

## 2016-01-24 MED ORDER — PREDNISONE 10 MG PO TABS: 20.0000 mg | ORAL_TABLET | Freq: Every day | ORAL | 0 refills | Status: DC

## 2016-01-24 NOTE — ED Notes (Signed)
Pt given water and pack of nabs (crackers)

## 2016-01-24 NOTE — ED Triage Notes (Signed)
Patient reports of cough x3 days and started this morning with shortness of breath. Get dialysis M/W/F and had extra round today but shortness is no better per patient. Denies fevers.

## 2016-01-24 NOTE — ED Provider Notes (Signed)
[facility] DEPT Provider Note   CSN: [id number] Arrival date & time: 01/24/16  1657     History   Chief Complaint Chief Complaint  Patient presents with  . Shortness of Breath    HPI Kirk [name] is a 56 y.o. male.  Patient complains of cough shortness of breath and wheezing   The history is provided by the patient. No language interpreter was used.  Shortness of Breath  This is a new problem. The problem occurs frequently.The current episode started 2 days ago. The problem has not changed since onset.Associated symptoms include wheezing. Pertinent negatives include no fever, no headaches, no cough, no chest pain, no abdominal pain and no rash. It is unknown what precipitated the problem. Risk factors include recent prolonged sitting. He has tried nothing for the symptoms.    Past Medical History:  Diagnosis Date  . Acute edema of lung, unspecified   . Acute, but ill-defined, cerebrovascular disease   . 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)   . Anxiety   . Asthma   . Carpal tunnel syndrome   . Cellulitis and abscess of trunk   . Cholelithiasis 07/13/2014  . Chronic headaches   . Debility, unspecified   . Dermatophytosis of the body   . Dysrhythmia    history of  . Edema   . End stage renal disease on dialysis [facility])    "MWF; [facility]" (10/21/2014)  . Essential hypertension, benign   . Gout, unspecified   . HTN (hypertension)   . Hypertrophy of prostate without urinary obstruction and other lower urinary tract symptoms (LUTS)   . Hypotension, unspecified   . Impotence of organic origin   . Insomnia, unspecified   . Kidney replaced by transplant   . Localization-related (focal) (partial) epilepsy and epileptic syndromes with complex partial seizures, without mention of intractable epilepsy   . Lumbago   . Memory loss   . OSA on CPAP   . Other and unspecified hyperlipidemia   . Other chronic nonalcoholic liver disease   . Other  malaise and fatigue   . Other nonspecific abnormal serum enzyme levels   . Pain in joint, lower leg   . Pain in joint, upper arm   . Pneumonia "several times"  . Renal dialysis status(V45.11) 02/05/2010   restarted 01/02/13 ofter renal trransplant failure  . Secondary hyperparathyroidism (of renal origin)   . Shortness of breath   . Tension headache   . Unspecified constipation   . Unspecified essential hypertension   . Unspecified hereditary and idiopathic peripheral neuropathy   . Unspecified vitamin D deficiency     Patient Active Problem List   Diagnosis Date Noted  . Nausea without vomiting 12/14/2015  . End stage renal disease ([place])   . Palpitations 06/29/2015  . Need for hepatitis C screening test 06/29/2015  . Fatigue 06/29/2015  . Sleep difficulties 06/29/2015  . Myalgia 05/17/2015  . Acute dyspnea   . 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 Obesity hypoventilation syndrome ([place]) 12/09/2014  . Extrinsic asthma 12/09/2014  . Narcotic drug use 12/09/2014  . Hypersomnia with sleep apnea 12/09/2014  . SCC (squamous cell carcinoma), arm 11/09/2014  . Mild persistent chronic asthma without complication 82/42/3536  . ESRD on dialysis ([place]) 09/09/2014  . HLD (hyperlipidemia) 09/09/2014  . Dyspnea 06/23/2014  . Pancytopenia ([place]) 04/28/2014  . Thrombocytopenia ([place]) 04/10/2014  . Fungal dermatitis 03/17/2014  . History of surgical procedure 09/17/2013  . Headache(784.0) 04/28/2013  . Awaiting organ  transplant 02/13/2013  . HTN (hypertension)   . DM (diabetes mellitus), type 2 with renal complications ([place])   . Tension headache   . Memory loss   . Anemia in chronic renal disease   . Edema   . Pain in joint, lower leg 05/08/2012  . Thoracic or lumbosacral neuritis or radiculitis 03/27/2012  . Nerve root pain 03/27/2012  . History of kidney transplant 09/10/2011  . Hearing loss 08/16/2011  . Obesity 08/16/2011  . Difficulty hearing 08/16/2011  . OSA on CPAP  08/05/2011    Past Surgical History:  Procedure Laterality Date  . AV FISTULA PLACEMENT Left ?2010   "forearm; at [facility]"  . BACK SURGERY    . CARDIAC CATHETERIZATION  03/21/2011  . CHOLECYSTECTOMY N/A 10/21/2014   Procedure: LAPAROSCOPIC CHOLECYSTECTOMY WITH INTRAOPERATIVE CHOLANGIOGRAM;  Surgeon: Autumn Messing III, MD;  Location: [facility];  Service: General;  Laterality: N/A;  . COLONOSCOPY    . 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 KIDNEY TRANSPLANT  08/17/2011   [place]  10/21/2014   w/IOC  . LEFT HEART CATHETERIZATION WITH CORONARY ANGIOGRAM N/A 03/21/2011   Procedure: LEFT HEART CATHETERIZATION WITH CORONARY ANGIOGRAM;  Surgeon: [name], MD;  Location: [facility] CATH LAB;  Service: Cardiovascular;  Laterality: N/A;  . NEPHRECTOMY  08/2013   removed transplaned kidney  . POSTERIOR FUSION CERVICAL SPINE  06/25/2012   for spinal stenosis  . VASECTOMY  2010       Home Medications    Prior to Admission medications   Medication Sig Start Date End Date Taking? Authorizing Provider  acetaminophen (TYLENOL) 500 MG tablet Take 1 tablet (500 mg total) by mouth every 6 (six) hours as needed for moderate pain. 09/12/14  Yes Samuella Cota, MD  amLODipine (NORVASC) 10 MG tablet TAKE 1 TABLET DAILY 12/19/15  Yes Estill Dooms, MD  aspirin EC 81 MG tablet Take 81 mg by mouth daily.   Yes Historical Provider, MD  b complex-vitamin c-folic acid (NEPHRO-VITE) 0.8 MG TABS tablet Take 1 tablet by mouth daily. 02/08/15  Yes Estill Dooms, MD  budesonide-formoterol (SYMBICORT) 80-4.5 MCG/ACT inhaler Inhale 2 puffs into the lungs 2 (two) times daily. Patient taking differently: Inhale 2 puffs into the lungs 2 (two) times daily as needed.  09/21/14  Yes Bonnielee Haff, MD  calcitRIOL (ROCALTROL) 0.25 MCG capsule Take 1 capsule (0.25 mcg total) by mouth every Monday, Wednesday, and Friday with hemodialysis. 09/21/14  Yes Bonnielee Haff,  MD  carvedilol (COREG) 25 MG tablet Take 1 tablet (25 mg total) by mouth 2 (two) times daily. Take one tablet in the morning and one tablet in the evening 02/08/15  Yes Estill Dooms, MD  Cholecalciferol (VITAMIN D3) 5000 UNITS TABS Take 1 tablet by mouth daily.   Yes Historical Provider, MD  diphenhydrAMINE (BENADRYL) 25 MG tablet Take 50 mg by mouth 2 (two) times daily.    Yes Historical Provider, MD  doxazosin (CARDURA) 4 MG tablet Take 0.5 tablet  by mouth in the am and 0.5 tablet by mouth in the pm to regulate blood pressure. 02/08/15  Yes Estill Dooms, MD  guaiFENesin-dextromethorphan (ROBITUSSIN DM) 100-10 MG/5ML syrup Take 10 mLs by mouth every 4 (four) hours as needed for cough.   Yes Historical Provider, MD  hydrALAZINE (APRESOLINE) 25 MG tablet Take 1 tablet (25 mg total) by mouth 3 (three) times daily. 12/11/15  Yes Kathie Dike, MD  ipratropium-albuterol (DUONEB) 0.5-2.5 (3) MG/3ML SOLN Take 3 mLs by nebulization 3 (three) times daily. ICD 10 code J44.9 Patient taking differently: Take 3 mLs by nebulization every 6 (six) hours as needed. ICD 10 code J44.9 12/11/15  Yes Kathie Dike, MD  metoCLOPramide (REGLAN) 10 MG tablet One before each meal to increase stomach emptying Patient taking differently: Take 10 mg by mouth 3 (three) times daily before meals. to increase stomach emptying. Takes as needed` 12/14/15  Yes Estill Dooms, MD  omeprazole (PRILOSEC) 20 MG capsule TAKE 1 CAPSULE (20 MG TOTAL) BY MOUTH 2 (TWO) TIMES DAILY BEFORE A MEAL. 12/01/15  Yes Levin Erp, PA  pravastatin (PRAVACHOL) 40 MG tablet TAKE 1 TABLET EVERY EVENING 12/19/15  Yes Estill Dooms, MD  traMADol Veatrice Bourbon) 50 MG tablet One up to 4 times daly if needed for pain 12/14/15  Yes Estill Dooms, MD  ciprofloxacin (CIPRO) 500 MG tablet Take 500 mg by mouth daily. 10 day curse starting on 01/19/2016 01/19/16   Historical Provider, MD  levofloxacin (LEVAQUIN) 500 MG tablet Take 1 tablet (500 mg total) by  mouth daily. 01/24/16   Milton Ferguson, MD  predniSONE (DELTASONE) 10 MG tablet Take 2 tablets (20 mg total) by mouth daily. 01/24/16   Milton Ferguson, MD  promethazine (PHENERGAN) 25 MG tablet Take 1 tablet (25 mg total) by mouth every 8 (eight) hours as needed for nausea or vomiting. Patient not taking: Reported on 01/24/2016 12/14/15   Estill Dooms, MD    Family History Family History  Problem Relation Age of Onset  . Adopted: Yes  . Colon cancer Neg Hx   . Esophageal cancer Neg Hx   . Rectal cancer Neg Hx   . Stomach cancer Neg Hx     Social History Social History  Substance Use Topics  . Smoking status: Former Smoker    Packs/day: 0.50    Years: 32.00    Types: Cigarettes    Quit date: 08/23/2014  . Smokeless tobacco: Never Used  . Alcohol use No     Allergies   Codeine   Review of Systems Review of Systems  Constitutional: Negative for appetite change, fatigue and fever.  HENT: Negative for congestion, ear discharge and sinus pressure.   Eyes: Negative for discharge.  Respiratory: Positive for shortness of breath and wheezing. Negative for cough.   Cardiovascular: Negative for chest pain.  Gastrointestinal: Negative for abdominal pain and diarrhea.  Genitourinary: Negative for frequency and hematuria.  Musculoskeletal: Negative for back pain.  Skin: Negative for rash.  Neurological: Negative for seizures and headaches.  Psychiatric/Behavioral: Negative for hallucinations.     Physical Exam Updated Vital Signs BP 157/73   Pulse 92   Temp 98.8 F (37.1 C) (Oral)   Resp 16   Ht 5\' 7"  (1.702 m)   Wt 233 lb (105.7 kg)   SpO2 93%   BMI 36.49 kg/m   Physical Exam  Constitutional: He is oriented to person, place, and time. He appears well-developed.  HENT:  Head: Normocephalic.  Eyes: Conjunctivae and EOM are normal. No scleral icterus.  Neck: Neck supple. No thyromegaly present.  Cardiovascular: Normal rate and regular rhythm.  Exam reveals no gallop and no  friction rub.   No murmur heard. Pulmonary/Chest: No stridor. He has wheezes. He has no rales. He exhibits no tenderness.  Abdominal: He exhibits no distension. There is no tenderness. There is no rebound.  Musculoskeletal: Normal range of motion. He exhibits no edema.  Lymphadenopathy:    He has no cervical adenopathy.  Neurological: He is oriented to person, place, and time. He exhibits normal muscle tone. Coordination normal.  Skin: No rash noted. No erythema.  Psychiatric: He has a normal mood and affect. His behavior is normal.     ED Treatments / Results  Labs (all labs ordered are listed, but only abnormal results are displayed) Labs Reviewed  CBC WITH DIFFERENTIAL/PLATELET - Abnormal; Notable for the following:       Result Value   RBC 3.41 (*)    Hemoglobin 11.2 (*)    HCT 33.5 (*)    Platelets 138 (*)    All other components within normal limits  BASIC METABOLIC PANEL - Abnormal; Notable for the following:    Potassium 3.3 (*)    Chloride 94 (*)    Glucose, Bld 134 (*)    BUN 22 (*)    Creatinine, Ser 8.99 (*)    GFR calc non Af Amer 6 (*)    GFR calc Af Amer 7 (*)    All other components within normal limits  BRAIN NATRIURETIC PEPTIDE - Abnormal; Notable for the following:    B Natriuretic Peptide 182.0 (*)    All other components within normal limits    EKG  EKG Interpretation  Date/Time:  Tuesday January 24 2016 17:11:05 EST Ventricular Rate:  96 PR Interval:    QRS Duration: 81 QT Interval:  358 QTC Calculation: 453 R Axis:   74 Text Interpretation:  Sinus rhythm Minimal ST depression, inferior leads Baseline wander in lead(s) V6 Confirmed by Frazer Rainville  MD, Zalia Hautala [phone]) on 01/24/2016 8:47:20 PM       Radiology Dg Chest 2 View  Result Date: 01/24/2016 CLINICAL DATA:  Cough x3 days with dyspnea EXAM: CHEST  2 VIEW COMPARISON:  CT 12/07/2015 and CXR 05/17/2015 FINDINGS: Cardiac silhouette is borderline enlarged. The aorta is not aneurysmal. No pneumonic  consolidation or CHF. No effusion or pneumothorax. ACDF of the lower cervical spine. IMPRESSION: No active cardiopulmonary disease. Electronically Signed   By: Ashley Royalty M.D.   On: 01/24/2016 19:09    Procedures Procedures (including critical care time)  Medications Ordered in ED Medications  levofloxacin (LEVAQUIN) tablet 500 mg (not administered)  methylPREDNISolone sodium succinate (SOLU-MEDROL) 125 mg/2 mL injection 125 mg (125 mg Intravenous Given 01/24/16 1741)  ipratropium-albuterol (DUONEB) 0.5-2.5 (3) MG/3ML nebulizer solution 3 mL (3 mLs Nebulization Given 01/24/16 1745)  albuterol (PROVENTIL) (2.5 MG/3ML) 0.083% nebulizer solution 2.5 mg (2.5 mg Nebulization Given 01/24/16 1747)  ipratropium-albuterol (DUONEB) 0.5-2.5 (3) MG/3ML nebulizer solution 3 mL (3 mLs Nebulization Given 01/24/16 2017)  albuterol (PROVENTIL) (2.5 MG/3ML) 0.083% nebulizer solution 2.5 mg (2.5 mg Nebulization Given 01/24/16 2017)  traMADol (ULTRAM) tablet 50 mg (50 mg Oral Given 01/24/16 2003)     Initial Impression / Assessment and Plan / ED Course  I have reviewed the triage vital signs and the nursing notes.  Pertinent labs & imaging results that were available during my care of the patient were reviewed by me and considered in my medical decision making (see chart for details).  Clinical Course     Patient with respiratory infection and COPD exacerbation. Patient improved some with neb treatments and steroids. Patient will be put on Levaquin and told to stop the Cipro. Also patient is put on prednisone and will continue his neb treatments every 4-6 hours. Patient will follow-up with his PCP in 2-3 days for recheck  Final Clinical Impressions(s) / ED  Diagnoses   Final diagnoses:  COPD exacerbation ([place])    New Prescriptions New Prescriptions   LEVOFLOXACIN (LEVAQUIN) 500 MG TABLET    Take 1 tablet (500 mg total) by mouth daily.   PREDNISONE (DELTASONE) 10 MG TABLET    Take 2 tablets (20 mg total) by  mouth daily.     Milton Ferguson, MD 01/24/16 2051

## 2016-01-24 NOTE — Discharge Instructions (Signed)
Use your nebulizer every 4 hours as needed. Stop taking the Cipro follow-up with her doctor in 2-3 days for recheck. Return sooner if problems

## 2016-01-24 NOTE — ED Notes (Signed)
Pt requesting to eat and drink

## 2016-01-25 ENCOUNTER — Other Ambulatory Visit: Payer: Self-pay | Admitting: Internal Medicine

## 2016-01-25 ENCOUNTER — Other Ambulatory Visit: Payer: Self-pay | Admitting: Physician Assistant

## 2016-01-25 DIAGNOSIS — D631 Anemia in chronic kidney disease: Secondary | ICD-10-CM | POA: Diagnosis not present

## 2016-01-25 DIAGNOSIS — E089 Diabetes mellitus due to underlying condition without complications: Secondary | ICD-10-CM | POA: Diagnosis not present

## 2016-01-25 DIAGNOSIS — D509 Iron deficiency anemia, unspecified: Secondary | ICD-10-CM | POA: Diagnosis not present

## 2016-01-25 DIAGNOSIS — R110 Nausea: Secondary | ICD-10-CM

## 2016-01-25 DIAGNOSIS — N2581 Secondary hyperparathyroidism of renal origin: Secondary | ICD-10-CM | POA: Diagnosis not present

## 2016-01-25 DIAGNOSIS — N186 End stage renal disease: Secondary | ICD-10-CM | POA: Diagnosis not present

## 2016-01-27 DIAGNOSIS — N2581 Secondary hyperparathyroidism of renal origin: Secondary | ICD-10-CM | POA: Diagnosis not present

## 2016-01-27 DIAGNOSIS — N186 End stage renal disease: Secondary | ICD-10-CM | POA: Diagnosis not present

## 2016-01-27 DIAGNOSIS — D509 Iron deficiency anemia, unspecified: Secondary | ICD-10-CM | POA: Diagnosis not present

## 2016-01-27 DIAGNOSIS — D631 Anemia in chronic kidney disease: Secondary | ICD-10-CM | POA: Diagnosis not present

## 2016-01-27 DIAGNOSIS — E089 Diabetes mellitus due to underlying condition without complications: Secondary | ICD-10-CM | POA: Diagnosis not present

## 2016-01-30 DIAGNOSIS — D631 Anemia in chronic kidney disease: Secondary | ICD-10-CM | POA: Diagnosis not present

## 2016-01-30 DIAGNOSIS — N186 End stage renal disease: Secondary | ICD-10-CM | POA: Diagnosis not present

## 2016-01-30 DIAGNOSIS — E089 Diabetes mellitus due to underlying condition without complications: Secondary | ICD-10-CM | POA: Diagnosis not present

## 2016-01-30 DIAGNOSIS — N2581 Secondary hyperparathyroidism of renal origin: Secondary | ICD-10-CM | POA: Diagnosis not present

## 2016-01-30 DIAGNOSIS — D509 Iron deficiency anemia, unspecified: Secondary | ICD-10-CM | POA: Diagnosis not present

## 2016-02-01 DIAGNOSIS — E089 Diabetes mellitus due to underlying condition without complications: Secondary | ICD-10-CM | POA: Diagnosis not present

## 2016-02-01 DIAGNOSIS — N186 End stage renal disease: Secondary | ICD-10-CM | POA: Diagnosis not present

## 2016-02-01 DIAGNOSIS — D509 Iron deficiency anemia, unspecified: Secondary | ICD-10-CM | POA: Diagnosis not present

## 2016-02-01 DIAGNOSIS — N2581 Secondary hyperparathyroidism of renal origin: Secondary | ICD-10-CM | POA: Diagnosis not present

## 2016-02-01 DIAGNOSIS — D631 Anemia in chronic kidney disease: Secondary | ICD-10-CM | POA: Diagnosis not present

## 2016-02-02 DIAGNOSIS — R310 Gross hematuria: Secondary | ICD-10-CM | POA: Diagnosis not present

## 2016-02-03 DIAGNOSIS — N2581 Secondary hyperparathyroidism of renal origin: Secondary | ICD-10-CM | POA: Diagnosis not present

## 2016-02-03 DIAGNOSIS — N186 End stage renal disease: Secondary | ICD-10-CM | POA: Diagnosis not present

## 2016-02-03 DIAGNOSIS — D631 Anemia in chronic kidney disease: Secondary | ICD-10-CM | POA: Diagnosis not present

## 2016-02-03 DIAGNOSIS — E089 Diabetes mellitus due to underlying condition without complications: Secondary | ICD-10-CM | POA: Diagnosis not present

## 2016-02-03 DIAGNOSIS — D509 Iron deficiency anemia, unspecified: Secondary | ICD-10-CM | POA: Diagnosis not present

## 2016-02-06 DIAGNOSIS — D509 Iron deficiency anemia, unspecified: Secondary | ICD-10-CM | POA: Diagnosis not present

## 2016-02-06 DIAGNOSIS — E089 Diabetes mellitus due to underlying condition without complications: Secondary | ICD-10-CM | POA: Diagnosis not present

## 2016-02-06 DIAGNOSIS — D631 Anemia in chronic kidney disease: Secondary | ICD-10-CM | POA: Diagnosis not present

## 2016-02-06 DIAGNOSIS — N2581 Secondary hyperparathyroidism of renal origin: Secondary | ICD-10-CM | POA: Diagnosis not present

## 2016-02-06 DIAGNOSIS — N186 End stage renal disease: Secondary | ICD-10-CM | POA: Diagnosis not present

## 2016-02-08 DIAGNOSIS — E089 Diabetes mellitus due to underlying condition without complications: Secondary | ICD-10-CM | POA: Diagnosis not present

## 2016-02-08 DIAGNOSIS — D631 Anemia in chronic kidney disease: Secondary | ICD-10-CM | POA: Diagnosis not present

## 2016-02-08 DIAGNOSIS — N186 End stage renal disease: Secondary | ICD-10-CM | POA: Diagnosis not present

## 2016-02-08 DIAGNOSIS — D509 Iron deficiency anemia, unspecified: Secondary | ICD-10-CM | POA: Diagnosis not present

## 2016-02-08 DIAGNOSIS — N2581 Secondary hyperparathyroidism of renal origin: Secondary | ICD-10-CM | POA: Diagnosis not present

## 2016-02-10 DIAGNOSIS — N186 End stage renal disease: Secondary | ICD-10-CM | POA: Diagnosis not present

## 2016-02-10 DIAGNOSIS — D631 Anemia in chronic kidney disease: Secondary | ICD-10-CM | POA: Diagnosis not present

## 2016-02-10 DIAGNOSIS — N2581 Secondary hyperparathyroidism of renal origin: Secondary | ICD-10-CM | POA: Diagnosis not present

## 2016-02-10 DIAGNOSIS — D509 Iron deficiency anemia, unspecified: Secondary | ICD-10-CM | POA: Diagnosis not present

## 2016-02-10 DIAGNOSIS — E089 Diabetes mellitus due to underlying condition without complications: Secondary | ICD-10-CM | POA: Diagnosis not present

## 2016-02-13 ENCOUNTER — Other Ambulatory Visit: Payer: Self-pay

## 2016-02-13 DIAGNOSIS — D509 Iron deficiency anemia, unspecified: Secondary | ICD-10-CM | POA: Diagnosis not present

## 2016-02-13 DIAGNOSIS — N186 End stage renal disease: Secondary | ICD-10-CM | POA: Diagnosis not present

## 2016-02-13 DIAGNOSIS — N2581 Secondary hyperparathyroidism of renal origin: Secondary | ICD-10-CM | POA: Diagnosis not present

## 2016-02-13 DIAGNOSIS — D631 Anemia in chronic kidney disease: Secondary | ICD-10-CM | POA: Diagnosis not present

## 2016-02-13 DIAGNOSIS — E089 Diabetes mellitus due to underlying condition without complications: Secondary | ICD-10-CM | POA: Diagnosis not present

## 2016-02-13 DIAGNOSIS — Z992 Dependence on renal dialysis: Principal | ICD-10-CM

## 2016-02-14 ENCOUNTER — Ambulatory Visit: Payer: Medicare Other

## 2016-02-14 ENCOUNTER — Other Ambulatory Visit: Payer: Medicare Other

## 2016-02-14 DIAGNOSIS — E1121 Type 2 diabetes mellitus with diabetic nephropathy: Secondary | ICD-10-CM | POA: Diagnosis not present

## 2016-02-14 DIAGNOSIS — N186 End stage renal disease: Secondary | ICD-10-CM | POA: Diagnosis not present

## 2016-02-14 DIAGNOSIS — Z992 Dependence on renal dialysis: Secondary | ICD-10-CM | POA: Diagnosis not present

## 2016-02-14 DIAGNOSIS — D631 Anemia in chronic kidney disease: Secondary | ICD-10-CM

## 2016-02-14 LAB — COMPREHENSIVE METABOLIC PANEL
ALT: 15 U/L (ref 9–46)
AST: 16 U/L (ref 10–35)
Albumin: 3.9 g/dL (ref 3.6–5.1)
Alkaline Phosphatase: 54 U/L (ref 40–115)
BUN: 52 mg/dL — AB (ref 7–25)
CHLORIDE: 97 mmol/L — AB (ref 98–110)
CO2: 26 mmol/L (ref 20–31)
CREATININE: 11.87 mg/dL — AB (ref 0.70–1.33)
Calcium: 9.2 mg/dL (ref 8.6–10.3)
GLUCOSE: 119 mg/dL — AB (ref 65–99)
Potassium: 4 mmol/L (ref 3.5–5.3)
SODIUM: 140 mmol/L (ref 135–146)
Total Bilirubin: 0.5 mg/dL (ref 0.2–1.2)
Total Protein: 6.6 g/dL (ref 6.1–8.1)

## 2016-02-14 LAB — CBC WITH DIFFERENTIAL/PLATELET
BASOS ABS: 46 {cells}/uL (ref 0–200)
Basophils Relative: 1 %
EOS PCT: 4 %
Eosinophils Absolute: 184 cells/uL (ref 15–500)
HCT: 35.9 % — ABNORMAL LOW (ref 38.5–50.0)
Hemoglobin: 11.7 g/dL — ABNORMAL LOW (ref 13.2–17.1)
Lymphocytes Relative: 21 %
Lymphs Abs: 966 cells/uL (ref 850–3900)
MCH: 31.5 pg (ref 27.0–33.0)
MCHC: 32.6 g/dL (ref 32.0–36.0)
MCV: 96.8 fL (ref 80.0–100.0)
MPV: 10.4 fL (ref 7.5–12.5)
Monocytes Absolute: 414 cells/uL (ref 200–950)
Monocytes Relative: 9 %
NEUTROS PCT: 65 %
Neutro Abs: 2990 cells/uL (ref 1500–7800)
Platelets: 97 10*3/uL — ABNORMAL LOW (ref 140–400)
RBC: 3.71 MIL/uL — ABNORMAL LOW (ref 4.20–5.80)
RDW: 15.8 % — AB (ref 11.0–15.0)
WBC: 4.6 10*3/uL (ref 3.8–10.8)

## 2016-02-14 LAB — RETICULOCYTES
ABS RETIC: 47710 {cells}/uL (ref 25000–90000)
RBC.: 3.67 MIL/uL — ABNORMAL LOW (ref 4.20–5.80)
Retic Ct Pct: 1.3 %

## 2016-02-15 ENCOUNTER — Encounter: Payer: Self-pay | Admitting: Internal Medicine

## 2016-02-15 ENCOUNTER — Ambulatory Visit (INDEPENDENT_AMBULATORY_CARE_PROVIDER_SITE_OTHER): Payer: Medicare Other | Admitting: Internal Medicine

## 2016-02-15 VITALS — BP 128/78 | HR 79 | Temp 98.8°F | Ht 67.0 in | Wt 224.8 lb

## 2016-02-15 DIAGNOSIS — I10 Essential (primary) hypertension: Secondary | ICD-10-CM | POA: Diagnosis not present

## 2016-02-15 DIAGNOSIS — R5382 Chronic fatigue, unspecified: Secondary | ICD-10-CM

## 2016-02-15 DIAGNOSIS — E6609 Other obesity due to excess calories: Secondary | ICD-10-CM | POA: Diagnosis not present

## 2016-02-15 DIAGNOSIS — R110 Nausea: Secondary | ICD-10-CM | POA: Diagnosis not present

## 2016-02-15 DIAGNOSIS — Z992 Dependence on renal dialysis: Secondary | ICD-10-CM

## 2016-02-15 DIAGNOSIS — J4530 Mild persistent asthma, uncomplicated: Secondary | ICD-10-CM | POA: Diagnosis not present

## 2016-02-15 DIAGNOSIS — R609 Edema, unspecified: Secondary | ICD-10-CM | POA: Diagnosis not present

## 2016-02-15 DIAGNOSIS — E1121 Type 2 diabetes mellitus with diabetic nephropathy: Secondary | ICD-10-CM

## 2016-02-15 DIAGNOSIS — E089 Diabetes mellitus due to underlying condition without complications: Secondary | ICD-10-CM | POA: Diagnosis not present

## 2016-02-15 DIAGNOSIS — D631 Anemia in chronic kidney disease: Secondary | ICD-10-CM | POA: Diagnosis not present

## 2016-02-15 DIAGNOSIS — Z6835 Body mass index (BMI) 35.0-35.9, adult: Secondary | ICD-10-CM

## 2016-02-15 DIAGNOSIS — N186 End stage renal disease: Secondary | ICD-10-CM | POA: Diagnosis not present

## 2016-02-15 DIAGNOSIS — IMO0001 Reserved for inherently not codable concepts without codable children: Secondary | ICD-10-CM

## 2016-02-15 DIAGNOSIS — D509 Iron deficiency anemia, unspecified: Secondary | ICD-10-CM | POA: Diagnosis not present

## 2016-02-15 DIAGNOSIS — N2581 Secondary hyperparathyroidism of renal origin: Secondary | ICD-10-CM | POA: Diagnosis not present

## 2016-02-15 MED ORDER — METOPROLOL TARTRATE 50 MG PO TABS: ORAL_TABLET | ORAL | 3 refills | Status: DC

## 2016-02-15 MED ORDER — AMLODIPINE BESYLATE 10 MG PO TABS: ORAL_TABLET | ORAL | 3 refills | Status: DC

## 2016-02-15 MED ORDER — HYDRALAZINE HCL 25 MG PO TABS: ORAL_TABLET | ORAL | 3 refills | Status: DC

## 2016-02-15 NOTE — Progress Notes (Signed)
Facility  [facility]    Place of Service:   OFFICE    Allergies  Allergen Reactions  . Codeine Nausea And Vomiting and Nausea Only    Altered mental status    Chief Complaint  Patient presents with  . Medical Management of Chronic Issues    2 month medication management blood sugar, blood pressure, anemia, review labs. Here with wife  . Fatigue    "just staying home"   . Follow-up    01/24/16 for SOB    HPI:  Last seen by me on 12/14/15 following hospitalization for pneumonia. Went to the ER on 01/24/16 for COPD exacerbation. Treated with prednisone and Levaquin. CXR did not show active pulmonary disease.  Mild persistent chronic asthma without complication - e currently feels comfortable with his breathing.  Type 2 diabetes mellitus with diabetic nephropathy, without long-term current use of insulin (HCC) - controlled  Anemia in chronic kidney disease, on chronic dialysis ([place]) - improving  Edema, unspecified type - improved  End stage renal disease (HCC) - remains on dialysis  ESRD on dialysis ([place]) - unchanged  Essential hypertension - controlled  Nausea without vomiting - persistent  Class 2 obesity due to excess calories with serious comorbidity and body mass index (BMI) of 35.0 to 35.9 in adult - has lost 9# since 02/13/15 unintentionally  Chronic fatigue - uncertain etiology    Medications: Patient's Medications  New Prescriptions   No medications on file  Previous Medications   ACETAMINOPHEN (TYLENOL) 500 MG TABLET    Take 1 tablet (500 mg total) by mouth every 6 (six) hours as needed for moderate pain.   AMLODIPINE (NORVASC) 10 MG TABLET    TAKE 1 TABLET DAILY   ASPIRIN EC 81 MG TABLET    Take 81 mg by mouth daily.   B COMPLEX-VITAMIN C-FOLIC ACID (NEPHRO-VITE) 0.8 MG TABS TABLET    Take 1 tablet by mouth daily.   BUDESONIDE-FORMOTEROL (SYMBICORT) 80-4.5 MCG/ACT INHALER    Inhale 2 puffs into the lungs 2 (two) times daily.   CALCITRIOL (ROCALTROL) 0.25 MCG  CAPSULE    Take 1 capsule (0.25 mcg total) by mouth every Monday, Wednesday, and Friday with hemodialysis.   CARVEDILOL (COREG) 25 MG TABLET    Take 1 tablet (25 mg total) by mouth 2 (two) times daily. Take one tablet in the morning and one tablet in the evening   CHOLECALCIFEROL (VITAMIN D3) 5000 UNITS TABS    Take 1 tablet by mouth daily.   DIPHENHYDRAMINE (BENADRYL) 25 MG TABLET    Take 50 mg by mouth 2 (two) times daily.    DOXAZOSIN (CARDURA) 4 MG TABLET    Take 0.5 tablet  by mouth in the am and 0.5 tablet by mouth in the pm to regulate blood pressure.   GUAIFENESIN-DEXTROMETHORPHAN (ROBITUSSIN DM) 100-10 MG/5ML SYRUP    Take 10 mLs by mouth every 4 (four) hours as needed for cough.   HYDRALAZINE (APRESOLINE) 25 MG TABLET    Take 1 tablet (25 mg total) by mouth 3 (three) times daily.   IPRATROPIUM-ALBUTEROL (DUONEB) 0.5-2.5 (3) MG/3ML SOLN    Take 3 mLs by nebulization 3 (three) times daily. ICD 10 code J44.9   METOCLOPRAMIDE (REGLAN) 10 MG TABLET    One before each meal to increase stomach emptying   OMEPRAZOLE (PRILOSEC) 20 MG CAPSULE    TAKE 1 CAPSULE (20 MG TOTAL) BY MOUTH 2 (TWO) TIMES DAILY BEFORE A MEAL.   PRAVASTATIN (PRAVACHOL) 40 MG TABLET  TAKE 1 TABLET EVERY EVENING   PROMETHAZINE (PHENERGAN) 25 MG TABLET    TAKE 1 TABLET BY MOUTH EVERY 8 HOURS AS NEEDED FOR NAUSEA AND VOMITING.. CAN FILL 08/10   TRAMADOL (ULTRAM) 50 MG TABLET    One up to 4 times daly if needed for pain  Modified Medications   No medications on file  Discontinued Medications   CIPROFLOXACIN (CIPRO) 500 MG TABLET    Take 500 mg by mouth daily. 10 day curse starting on 01/19/2016   LEVOFLOXACIN (LEVAQUIN) 500 MG TABLET    Take 1 tablet (500 mg total) by mouth daily.   PREDNISONE (DELTASONE) 10 MG TABLET    Take 2 tablets (20 mg total) by mouth daily.    Review of Systems  Constitutional: Positive for fatigue. Negative for fever.       Chronically fatigued and sleepy.  HENT: Positive for ear discharge and  hearing loss. Negative for ear pain and tinnitus.   Eyes: Negative.   Respiratory: Negative for shortness of breath.        Dyspnea on exertion has improved. Pneumonia. Hospitalized 04/06/14 and 06/23/2014. Rehospitalized 09/18/2014 for increasing dyspnea felt secondary to acute exacerbation of his COPD plus fluid overload. Re hospitalized 12/07/15 to 12/11/15 for bilateral HCAP.  Cardiovascular: Positive for palpitations. Negative for chest pain and leg swelling.  Gastrointestinal: Negative for abdominal distention and abdominal pain.       Cholecystectomy 10/21/2014.  Endocrine:       Diabetes is under better control.  Genitourinary: Negative.        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  Musculoskeletal:       Neck discomfort. Chronic right knee discomfort. Chronic back pains. Unsteady gait.  Skin:       Tissue fibrosis at the laparoscopic cholecystectomy incisions in the right abdomen and maple.  Neurological: Positive for dizziness.       Memory impairment is stable. Feet feel numb. Recurrent headaches  Hematological:       Anemia of CKD.  Psychiatric/Behavioral: Positive for confusion and decreased concentration.       Insomnia. Falls asleep in the day.    Vitals:   02/15/16 1520  BP: 128/78  Pulse: 79  Temp: 98.8 F (37.1 C)  TempSrc: Oral  SpO2: 97%  Weight: 224 lb 12.8 oz (102 kg)  Height: 5' 7" (1.702 m)   Body mass index is 35.21 kg/m. Wt Readings from Last 3 Encounters:  02/15/16 224 lb 12.8 oz (102 kg)  01/24/16 233 lb (105.7 kg)  12/14/15 233 lb (105.7 kg)      Physical Exam  Constitutional: He is oriented to person, place, and time.  Obese.  HENT:  Loss of hearing.  Eyes:  Corrective lenses  Neck: No JVD present. No tracheal deviation present. No thyromegaly present.  Cardiovascular: Exam reveals no gallop.   No murmur heard. AF. 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  Abdominal: Soft. Bowel sounds  are normal. He exhibits no distension and no mass. There is no tenderness.  Tender in the RUQ. Frequent nausea.  Musculoskeletal: Normal range of motion. He exhibits tenderness. He exhibits no edema.  Scar left knee. Tender right knee  Lymphadenopathy:    He has no cervical adenopathy.  Neurological: He is alert and oriented to person, place, and time. No cranial nerve deficit. Coordination normal.  Positive Tinel sign bilaterally  Skin: No rash noted. No erythema. No pallor.  Tissue fibrosis at cholecystectomy  incisions in the right abdomen and umbilicus. Seborrheic nonhealing lesion of the left forearm. 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  Psychiatric:  Drowsy during the exam.    Labs reviewed: Lab Summary Latest Ref Rng & Units 02/14/2016 01/24/2016 12/11/2015 12/10/2015 12/09/2015 12/09/2015  Hemoglobin 13.2 - 17.1 g/dL 11.7(L) 11.2(L) 9.8(L) 9.6(L) 9.4(L) 8.8(L)  Hematocrit 38.5 - 50.0 % 35.9(L) 33.5(L) 29.4(L) 28.7(L) 29.2(L) 27.8(L)  White count 3.8 - 10.8 K/uL 4.6 5.7 8.4 5.3 6.2 5.3  Platelet count 140 - 400 K/uL 97(L) 138(L) 109(L) 85(L) 70(L) 69(L)  Sodium 135 - 146 mmol/L 140 136 134(L) 136 136 137  Potassium 3.5 - 5.3 mmol/L 4.0 3.3(L) 5.0 4.2 4.5 4.6  Calcium 8.6 - 10.3 mg/dL 9.2 9.1 9.6 9.2 9.1 9.1  Phosphorus 2.5 - 4.6 mg/dL (None) (None) 3.0 1.9(L) 5.1(H) 5.2(H)  Creatinine 0.70 - 1.33 mg/dL 11.87(H) 8.99(H) 12.38(H) 9.42(H) 13.30(H) 12.08(H)  AST 10 - 35 U/L 16 (None) (None) (None) (None) (None)  Alk Phos 40 - 115 U/L 54 (None) (None) (None) (None) (None)  Bilirubin 0.2 - 1.2 mg/dL 0.5 (None) (None) (None) (None) (None)  Glucose 65 - 99 mg/dL 119(H) 134(H) 164(H) 230(H) 133(H) 114(H)  Cholesterol - (None) (None) (None) (None) (None) (None)  HDL cholesterol - (None) (None) (None) (None) (None) (None)  Triglycerides - (None) (None) (None) (None) (None) (None)  LDL Direct - (None) (None) (None) (None) (None) (None)  LDL Calc - (None) (None) (None) (None) (None)  (None)  Total protein 6.1 - 8.1 g/dL 6.6 (None) (None) (None) (None) (None)  Albumin 3.6 - 5.1 g/dL 3.9 (None) 3.8 3.5 3.7 3.4(L)  Some recent data might be hidden   Lab Results  Component Value Date   TSH 3.480 06/29/2015   TSH 2.466 *test methodology is 3rd generation TSH* 12/17/2006   TSH 2.771 *Test methodology is 3rd generation TSH* 12/13/2006   Lab Results  Component Value Date   BUN 52 (H) 02/14/2016   BUN 22 (H) 01/24/2016   BUN 70 (H) 12/11/2015   Lab Results  Component Value Date   HGBA1C 6.0 (H) 06/29/2015   HGBA1C 6.5 02/09/2015   HGBA1C 6.7 (H) 09/14/2014    Assessment/Plan  1. Mild persistent chronic asthma without complication No active wheezing. Denies dyspnea at rest today  2. Type 2 diabetes mellitus with diabetic nephropathy, without long-term current use of insulin (HCC) controlled  3. Anemia in chronic kidney disease, on chronic dialysis ([place]) improved  4. Edema, unspecified type improved  5. End stage renal disease ([place]) Continue dialysis  6. ESRD on dialysis [facility]) continue  7. Essential hypertension Insurance will not approve carvedilol. Will switch too metoprolol - metoprolol (LOPRESSOR) 50 MG tablet; One twice daily to regulate BP  Dispense: 180 tablet; Refill: 3 - hydrALAZINE (APRESOLINE) 25 MG tablet; One twice daily to regulate BP  Dispense: 180 tablet; Refill: 3 - amLODipine (NORVASC) 10 MG tablet; One twice daily to control BP  Dispense: 180 tablet; Refill: 3  8. Nausea without vomiting Some improvement, but still gets sudden attacks. Using promethazine mainly.  9. Class 2 obesity due to excess calories with serious comorbidity and body mass index (BMI) of 35.0 to 35.9 in adult Weight down 9# since 02/13/15. Not intentional.  10. Chronic fatigue Uncertain etiology. Could be related to dialysis, promethazine, depression, etc.

## 2016-02-16 LAB — MICROALBUMIN, URINE: MICROALB UR: 184.6 mg/dL

## 2016-02-16 LAB — HEMOGLOBIN A1C
Hgb A1c MFr Bld: 5.4 % (ref <5.7)
MEAN PLASMA GLUCOSE: 108 mg/dL

## 2016-02-17 ENCOUNTER — Telehealth: Payer: Self-pay

## 2016-02-17 DIAGNOSIS — N186 End stage renal disease: Secondary | ICD-10-CM | POA: Diagnosis not present

## 2016-02-17 DIAGNOSIS — D509 Iron deficiency anemia, unspecified: Secondary | ICD-10-CM | POA: Diagnosis not present

## 2016-02-17 DIAGNOSIS — D631 Anemia in chronic kidney disease: Secondary | ICD-10-CM | POA: Diagnosis not present

## 2016-02-17 DIAGNOSIS — E089 Diabetes mellitus due to underlying condition without complications: Secondary | ICD-10-CM | POA: Diagnosis not present

## 2016-02-17 DIAGNOSIS — N2581 Secondary hyperparathyroidism of renal origin: Secondary | ICD-10-CM | POA: Diagnosis not present

## 2016-02-17 NOTE — Telephone Encounter (Signed)
Message received via fax from [place] requesting confirmation that patient is to take high dose Amlodipine 10 mg twice daily totaling 20 mg daily  Please advise    Fax was placed in my mailbox, if this is correct for patient to take this medication as prescribed, form can be signed by provider or medical assistant and sent back to [facility] @ [phone]

## 2016-02-17 NOTE — Telephone Encounter (Signed)
He is supposed to be taking amlodipine 10 mg twice daily.

## 2016-02-17 NOTE — Telephone Encounter (Signed)
Form faxed back to [facility] Stating Amlodipine 10mg  twice daily.

## 2016-02-20 DIAGNOSIS — D509 Iron deficiency anemia, unspecified: Secondary | ICD-10-CM | POA: Diagnosis not present

## 2016-02-20 DIAGNOSIS — N186 End stage renal disease: Secondary | ICD-10-CM | POA: Diagnosis not present

## 2016-02-20 DIAGNOSIS — E089 Diabetes mellitus due to underlying condition without complications: Secondary | ICD-10-CM | POA: Diagnosis not present

## 2016-02-20 DIAGNOSIS — D631 Anemia in chronic kidney disease: Secondary | ICD-10-CM | POA: Diagnosis not present

## 2016-02-20 DIAGNOSIS — N2581 Secondary hyperparathyroidism of renal origin: Secondary | ICD-10-CM | POA: Diagnosis not present

## 2016-02-22 DIAGNOSIS — T8612 Kidney transplant failure: Secondary | ICD-10-CM | POA: Diagnosis not present

## 2016-02-22 DIAGNOSIS — E089 Diabetes mellitus due to underlying condition without complications: Secondary | ICD-10-CM | POA: Diagnosis not present

## 2016-02-22 DIAGNOSIS — N2581 Secondary hyperparathyroidism of renal origin: Secondary | ICD-10-CM | POA: Diagnosis not present

## 2016-02-22 DIAGNOSIS — Z992 Dependence on renal dialysis: Secondary | ICD-10-CM | POA: Diagnosis not present

## 2016-02-22 DIAGNOSIS — D509 Iron deficiency anemia, unspecified: Secondary | ICD-10-CM | POA: Diagnosis not present

## 2016-02-22 DIAGNOSIS — D631 Anemia in chronic kidney disease: Secondary | ICD-10-CM | POA: Diagnosis not present

## 2016-02-22 DIAGNOSIS — N186 End stage renal disease: Secondary | ICD-10-CM | POA: Diagnosis not present

## 2016-02-23 ENCOUNTER — Telehealth: Payer: Self-pay

## 2016-02-23 NOTE — Telephone Encounter (Signed)
Patient's wife dropped off an application for renewal of disability parking placard for completion. Form to be faxed to address on file when complete

## 2016-02-24 DIAGNOSIS — D509 Iron deficiency anemia, unspecified: Secondary | ICD-10-CM | POA: Diagnosis not present

## 2016-02-24 DIAGNOSIS — N186 End stage renal disease: Secondary | ICD-10-CM | POA: Diagnosis not present

## 2016-02-24 DIAGNOSIS — E089 Diabetes mellitus due to underlying condition without complications: Secondary | ICD-10-CM | POA: Diagnosis not present

## 2016-02-24 DIAGNOSIS — N2581 Secondary hyperparathyroidism of renal origin: Secondary | ICD-10-CM | POA: Diagnosis not present

## 2016-02-27 DIAGNOSIS — N2581 Secondary hyperparathyroidism of renal origin: Secondary | ICD-10-CM | POA: Diagnosis not present

## 2016-02-27 DIAGNOSIS — N186 End stage renal disease: Secondary | ICD-10-CM | POA: Diagnosis not present

## 2016-02-27 DIAGNOSIS — D509 Iron deficiency anemia, unspecified: Secondary | ICD-10-CM | POA: Diagnosis not present

## 2016-02-27 DIAGNOSIS — E089 Diabetes mellitus due to underlying condition without complications: Secondary | ICD-10-CM | POA: Diagnosis not present

## 2016-02-29 DIAGNOSIS — D509 Iron deficiency anemia, unspecified: Secondary | ICD-10-CM | POA: Diagnosis not present

## 2016-02-29 DIAGNOSIS — E089 Diabetes mellitus due to underlying condition without complications: Secondary | ICD-10-CM | POA: Diagnosis not present

## 2016-02-29 DIAGNOSIS — N2581 Secondary hyperparathyroidism of renal origin: Secondary | ICD-10-CM | POA: Diagnosis not present

## 2016-02-29 DIAGNOSIS — N186 End stage renal disease: Secondary | ICD-10-CM | POA: Diagnosis not present

## 2016-03-02 DIAGNOSIS — N2581 Secondary hyperparathyroidism of renal origin: Secondary | ICD-10-CM | POA: Diagnosis not present

## 2016-03-02 DIAGNOSIS — E089 Diabetes mellitus due to underlying condition without complications: Secondary | ICD-10-CM | POA: Diagnosis not present

## 2016-03-02 DIAGNOSIS — D509 Iron deficiency anemia, unspecified: Secondary | ICD-10-CM | POA: Diagnosis not present

## 2016-03-02 DIAGNOSIS — N186 End stage renal disease: Secondary | ICD-10-CM | POA: Diagnosis not present

## 2016-03-05 DIAGNOSIS — E089 Diabetes mellitus due to underlying condition without complications: Secondary | ICD-10-CM | POA: Diagnosis not present

## 2016-03-05 DIAGNOSIS — D509 Iron deficiency anemia, unspecified: Secondary | ICD-10-CM | POA: Diagnosis not present

## 2016-03-05 DIAGNOSIS — N186 End stage renal disease: Secondary | ICD-10-CM | POA: Diagnosis not present

## 2016-03-05 DIAGNOSIS — N2581 Secondary hyperparathyroidism of renal origin: Secondary | ICD-10-CM | POA: Diagnosis not present

## 2016-03-07 DIAGNOSIS — N186 End stage renal disease: Secondary | ICD-10-CM | POA: Diagnosis not present

## 2016-03-07 DIAGNOSIS — D509 Iron deficiency anemia, unspecified: Secondary | ICD-10-CM | POA: Diagnosis not present

## 2016-03-07 DIAGNOSIS — N2581 Secondary hyperparathyroidism of renal origin: Secondary | ICD-10-CM | POA: Diagnosis not present

## 2016-03-07 DIAGNOSIS — E089 Diabetes mellitus due to underlying condition without complications: Secondary | ICD-10-CM | POA: Diagnosis not present

## 2016-03-09 DIAGNOSIS — N2581 Secondary hyperparathyroidism of renal origin: Secondary | ICD-10-CM | POA: Diagnosis not present

## 2016-03-09 DIAGNOSIS — N186 End stage renal disease: Secondary | ICD-10-CM | POA: Diagnosis not present

## 2016-03-09 DIAGNOSIS — D509 Iron deficiency anemia, unspecified: Secondary | ICD-10-CM | POA: Diagnosis not present

## 2016-03-09 DIAGNOSIS — E089 Diabetes mellitus due to underlying condition without complications: Secondary | ICD-10-CM | POA: Diagnosis not present

## 2016-03-12 ENCOUNTER — Other Ambulatory Visit (HOSPITAL_COMMUNITY): Payer: Self-pay | Admitting: Nephrology

## 2016-03-12 ENCOUNTER — Ambulatory Visit (HOSPITAL_COMMUNITY)
Admission: RE | Admit: 2016-03-12 | Discharge: 2016-03-12 | Disposition: A | Discharge status: Home / Self Care | Payer: Medicare Other | Source: Ambulatory Visit | Attending: Nephrology | Admitting: Nephrology

## 2016-03-12 DIAGNOSIS — R079 Chest pain, unspecified: Secondary | ICD-10-CM | POA: Insufficient documentation

## 2016-03-12 DIAGNOSIS — E089 Diabetes mellitus due to underlying condition without complications: Secondary | ICD-10-CM | POA: Diagnosis not present

## 2016-03-12 DIAGNOSIS — R042 Hemoptysis: Secondary | ICD-10-CM | POA: Diagnosis not present

## 2016-03-12 DIAGNOSIS — D509 Iron deficiency anemia, unspecified: Secondary | ICD-10-CM | POA: Diagnosis not present

## 2016-03-12 DIAGNOSIS — N2581 Secondary hyperparathyroidism of renal origin: Secondary | ICD-10-CM | POA: Diagnosis not present

## 2016-03-12 DIAGNOSIS — N186 End stage renal disease: Secondary | ICD-10-CM | POA: Diagnosis not present

## 2016-03-14 DIAGNOSIS — E089 Diabetes mellitus due to underlying condition without complications: Secondary | ICD-10-CM | POA: Diagnosis not present

## 2016-03-14 DIAGNOSIS — N2581 Secondary hyperparathyroidism of renal origin: Secondary | ICD-10-CM | POA: Diagnosis not present

## 2016-03-14 DIAGNOSIS — N186 End stage renal disease: Secondary | ICD-10-CM | POA: Diagnosis not present

## 2016-03-14 DIAGNOSIS — D509 Iron deficiency anemia, unspecified: Secondary | ICD-10-CM | POA: Diagnosis not present

## 2016-03-16 DIAGNOSIS — E089 Diabetes mellitus due to underlying condition without complications: Secondary | ICD-10-CM | POA: Diagnosis not present

## 2016-03-16 DIAGNOSIS — N2581 Secondary hyperparathyroidism of renal origin: Secondary | ICD-10-CM | POA: Diagnosis not present

## 2016-03-16 DIAGNOSIS — D509 Iron deficiency anemia, unspecified: Secondary | ICD-10-CM | POA: Diagnosis not present

## 2016-03-16 DIAGNOSIS — N186 End stage renal disease: Secondary | ICD-10-CM | POA: Diagnosis not present

## 2016-03-17 ENCOUNTER — Other Ambulatory Visit: Payer: Self-pay | Admitting: Gastroenterology

## 2016-03-19 DIAGNOSIS — D509 Iron deficiency anemia, unspecified: Secondary | ICD-10-CM | POA: Diagnosis not present

## 2016-03-19 DIAGNOSIS — E089 Diabetes mellitus due to underlying condition without complications: Secondary | ICD-10-CM | POA: Diagnosis not present

## 2016-03-19 DIAGNOSIS — N186 End stage renal disease: Secondary | ICD-10-CM | POA: Diagnosis not present

## 2016-03-19 DIAGNOSIS — N2581 Secondary hyperparathyroidism of renal origin: Secondary | ICD-10-CM | POA: Diagnosis not present

## 2016-03-21 DIAGNOSIS — N186 End stage renal disease: Secondary | ICD-10-CM | POA: Diagnosis not present

## 2016-03-21 DIAGNOSIS — T8612 Kidney transplant failure: Secondary | ICD-10-CM | POA: Diagnosis not present

## 2016-03-21 DIAGNOSIS — E089 Diabetes mellitus due to underlying condition without complications: Secondary | ICD-10-CM | POA: Diagnosis not present

## 2016-03-21 DIAGNOSIS — Z992 Dependence on renal dialysis: Secondary | ICD-10-CM | POA: Diagnosis not present

## 2016-03-21 DIAGNOSIS — D509 Iron deficiency anemia, unspecified: Secondary | ICD-10-CM | POA: Diagnosis not present

## 2016-03-21 DIAGNOSIS — N2581 Secondary hyperparathyroidism of renal origin: Secondary | ICD-10-CM | POA: Diagnosis not present

## 2016-03-23 DIAGNOSIS — D509 Iron deficiency anemia, unspecified: Secondary | ICD-10-CM | POA: Diagnosis not present

## 2016-03-23 DIAGNOSIS — D631 Anemia in chronic kidney disease: Secondary | ICD-10-CM | POA: Diagnosis not present

## 2016-03-23 DIAGNOSIS — N186 End stage renal disease: Secondary | ICD-10-CM | POA: Diagnosis not present

## 2016-03-23 DIAGNOSIS — N2581 Secondary hyperparathyroidism of renal origin: Secondary | ICD-10-CM | POA: Diagnosis not present

## 2016-03-26 DIAGNOSIS — N2581 Secondary hyperparathyroidism of renal origin: Secondary | ICD-10-CM | POA: Diagnosis not present

## 2016-03-26 DIAGNOSIS — D631 Anemia in chronic kidney disease: Secondary | ICD-10-CM | POA: Diagnosis not present

## 2016-03-26 DIAGNOSIS — N186 End stage renal disease: Secondary | ICD-10-CM | POA: Diagnosis not present

## 2016-03-26 DIAGNOSIS — D509 Iron deficiency anemia, unspecified: Secondary | ICD-10-CM | POA: Diagnosis not present

## 2016-03-28 DIAGNOSIS — D509 Iron deficiency anemia, unspecified: Secondary | ICD-10-CM | POA: Diagnosis not present

## 2016-03-28 DIAGNOSIS — N186 End stage renal disease: Secondary | ICD-10-CM | POA: Diagnosis not present

## 2016-03-28 DIAGNOSIS — N2581 Secondary hyperparathyroidism of renal origin: Secondary | ICD-10-CM | POA: Diagnosis not present

## 2016-03-28 DIAGNOSIS — D631 Anemia in chronic kidney disease: Secondary | ICD-10-CM | POA: Diagnosis not present

## 2016-03-29 ENCOUNTER — Other Ambulatory Visit: Payer: Self-pay | Admitting: Internal Medicine

## 2016-03-29 DIAGNOSIS — N186 End stage renal disease: Secondary | ICD-10-CM

## 2016-03-29 DIAGNOSIS — Z992 Dependence on renal dialysis: Principal | ICD-10-CM

## 2016-03-30 DIAGNOSIS — N2581 Secondary hyperparathyroidism of renal origin: Secondary | ICD-10-CM | POA: Diagnosis not present

## 2016-03-30 DIAGNOSIS — N186 End stage renal disease: Secondary | ICD-10-CM | POA: Diagnosis not present

## 2016-03-30 DIAGNOSIS — D631 Anemia in chronic kidney disease: Secondary | ICD-10-CM | POA: Diagnosis not present

## 2016-03-30 DIAGNOSIS — D509 Iron deficiency anemia, unspecified: Secondary | ICD-10-CM | POA: Diagnosis not present

## 2016-04-02 DIAGNOSIS — D631 Anemia in chronic kidney disease: Secondary | ICD-10-CM | POA: Diagnosis not present

## 2016-04-02 DIAGNOSIS — D509 Iron deficiency anemia, unspecified: Secondary | ICD-10-CM | POA: Diagnosis not present

## 2016-04-02 DIAGNOSIS — N186 End stage renal disease: Secondary | ICD-10-CM | POA: Diagnosis not present

## 2016-04-02 DIAGNOSIS — N2581 Secondary hyperparathyroidism of renal origin: Secondary | ICD-10-CM | POA: Diagnosis not present

## 2016-04-05 DIAGNOSIS — N186 End stage renal disease: Secondary | ICD-10-CM | POA: Diagnosis not present

## 2016-04-05 DIAGNOSIS — D631 Anemia in chronic kidney disease: Secondary | ICD-10-CM | POA: Diagnosis not present

## 2016-04-05 DIAGNOSIS — D509 Iron deficiency anemia, unspecified: Secondary | ICD-10-CM | POA: Diagnosis not present

## 2016-04-05 DIAGNOSIS — N2581 Secondary hyperparathyroidism of renal origin: Secondary | ICD-10-CM | POA: Diagnosis not present

## 2016-04-06 DIAGNOSIS — N2581 Secondary hyperparathyroidism of renal origin: Secondary | ICD-10-CM | POA: Diagnosis not present

## 2016-04-06 DIAGNOSIS — D509 Iron deficiency anemia, unspecified: Secondary | ICD-10-CM | POA: Diagnosis not present

## 2016-04-06 DIAGNOSIS — D631 Anemia in chronic kidney disease: Secondary | ICD-10-CM | POA: Diagnosis not present

## 2016-04-06 DIAGNOSIS — N186 End stage renal disease: Secondary | ICD-10-CM | POA: Diagnosis not present

## 2016-04-09 DIAGNOSIS — N186 End stage renal disease: Secondary | ICD-10-CM | POA: Diagnosis not present

## 2016-04-09 DIAGNOSIS — D509 Iron deficiency anemia, unspecified: Secondary | ICD-10-CM | POA: Diagnosis not present

## 2016-04-09 DIAGNOSIS — N2581 Secondary hyperparathyroidism of renal origin: Secondary | ICD-10-CM | POA: Diagnosis not present

## 2016-04-09 DIAGNOSIS — D631 Anemia in chronic kidney disease: Secondary | ICD-10-CM | POA: Diagnosis not present

## 2016-04-11 DIAGNOSIS — D631 Anemia in chronic kidney disease: Secondary | ICD-10-CM | POA: Diagnosis not present

## 2016-04-11 DIAGNOSIS — D509 Iron deficiency anemia, unspecified: Secondary | ICD-10-CM | POA: Diagnosis not present

## 2016-04-11 DIAGNOSIS — N186 End stage renal disease: Secondary | ICD-10-CM | POA: Diagnosis not present

## 2016-04-11 DIAGNOSIS — N2581 Secondary hyperparathyroidism of renal origin: Secondary | ICD-10-CM | POA: Diagnosis not present

## 2016-04-13 DIAGNOSIS — N2581 Secondary hyperparathyroidism of renal origin: Secondary | ICD-10-CM | POA: Diagnosis not present

## 2016-04-13 DIAGNOSIS — D509 Iron deficiency anemia, unspecified: Secondary | ICD-10-CM | POA: Diagnosis not present

## 2016-04-13 DIAGNOSIS — D631 Anemia in chronic kidney disease: Secondary | ICD-10-CM | POA: Diagnosis not present

## 2016-04-13 DIAGNOSIS — N186 End stage renal disease: Secondary | ICD-10-CM | POA: Diagnosis not present

## 2016-04-16 DIAGNOSIS — D631 Anemia in chronic kidney disease: Secondary | ICD-10-CM | POA: Diagnosis not present

## 2016-04-16 DIAGNOSIS — N186 End stage renal disease: Secondary | ICD-10-CM | POA: Diagnosis not present

## 2016-04-16 DIAGNOSIS — D509 Iron deficiency anemia, unspecified: Secondary | ICD-10-CM | POA: Diagnosis not present

## 2016-04-16 DIAGNOSIS — N2581 Secondary hyperparathyroidism of renal origin: Secondary | ICD-10-CM | POA: Diagnosis not present

## 2016-04-18 DIAGNOSIS — D631 Anemia in chronic kidney disease: Secondary | ICD-10-CM | POA: Diagnosis not present

## 2016-04-18 DIAGNOSIS — N2581 Secondary hyperparathyroidism of renal origin: Secondary | ICD-10-CM | POA: Diagnosis not present

## 2016-04-18 DIAGNOSIS — N186 End stage renal disease: Secondary | ICD-10-CM | POA: Diagnosis not present

## 2016-04-18 DIAGNOSIS — D509 Iron deficiency anemia, unspecified: Secondary | ICD-10-CM | POA: Diagnosis not present

## 2016-04-20 DIAGNOSIS — D631 Anemia in chronic kidney disease: Secondary | ICD-10-CM | POA: Diagnosis not present

## 2016-04-20 DIAGNOSIS — N2581 Secondary hyperparathyroidism of renal origin: Secondary | ICD-10-CM | POA: Diagnosis not present

## 2016-04-20 DIAGNOSIS — N186 End stage renal disease: Secondary | ICD-10-CM | POA: Diagnosis not present

## 2016-04-20 DIAGNOSIS — D509 Iron deficiency anemia, unspecified: Secondary | ICD-10-CM | POA: Diagnosis not present

## 2016-04-21 DIAGNOSIS — Z992 Dependence on renal dialysis: Secondary | ICD-10-CM | POA: Diagnosis not present

## 2016-04-21 DIAGNOSIS — N186 End stage renal disease: Secondary | ICD-10-CM | POA: Diagnosis not present

## 2016-04-21 DIAGNOSIS — T8612 Kidney transplant failure: Secondary | ICD-10-CM | POA: Diagnosis not present

## 2016-04-23 DIAGNOSIS — D631 Anemia in chronic kidney disease: Secondary | ICD-10-CM | POA: Diagnosis not present

## 2016-04-23 DIAGNOSIS — N186 End stage renal disease: Secondary | ICD-10-CM | POA: Diagnosis not present

## 2016-04-23 DIAGNOSIS — N2581 Secondary hyperparathyroidism of renal origin: Secondary | ICD-10-CM | POA: Diagnosis not present

## 2016-04-23 DIAGNOSIS — D509 Iron deficiency anemia, unspecified: Secondary | ICD-10-CM | POA: Diagnosis not present

## 2016-04-25 DIAGNOSIS — D509 Iron deficiency anemia, unspecified: Secondary | ICD-10-CM | POA: Diagnosis not present

## 2016-04-25 DIAGNOSIS — N186 End stage renal disease: Secondary | ICD-10-CM | POA: Diagnosis not present

## 2016-04-25 DIAGNOSIS — N2581 Secondary hyperparathyroidism of renal origin: Secondary | ICD-10-CM | POA: Diagnosis not present

## 2016-04-25 DIAGNOSIS — D631 Anemia in chronic kidney disease: Secondary | ICD-10-CM | POA: Diagnosis not present

## 2016-04-28 DIAGNOSIS — D631 Anemia in chronic kidney disease: Secondary | ICD-10-CM | POA: Diagnosis not present

## 2016-04-28 DIAGNOSIS — D509 Iron deficiency anemia, unspecified: Secondary | ICD-10-CM | POA: Diagnosis not present

## 2016-04-28 DIAGNOSIS — N2581 Secondary hyperparathyroidism of renal origin: Secondary | ICD-10-CM | POA: Diagnosis not present

## 2016-04-28 DIAGNOSIS — N186 End stage renal disease: Secondary | ICD-10-CM | POA: Diagnosis not present

## 2016-04-30 DIAGNOSIS — D509 Iron deficiency anemia, unspecified: Secondary | ICD-10-CM | POA: Diagnosis not present

## 2016-04-30 DIAGNOSIS — N186 End stage renal disease: Secondary | ICD-10-CM | POA: Diagnosis not present

## 2016-04-30 DIAGNOSIS — N2581 Secondary hyperparathyroidism of renal origin: Secondary | ICD-10-CM | POA: Diagnosis not present

## 2016-04-30 DIAGNOSIS — D631 Anemia in chronic kidney disease: Secondary | ICD-10-CM | POA: Diagnosis not present

## 2016-05-02 DIAGNOSIS — N2581 Secondary hyperparathyroidism of renal origin: Secondary | ICD-10-CM | POA: Diagnosis not present

## 2016-05-02 DIAGNOSIS — N186 End stage renal disease: Secondary | ICD-10-CM | POA: Diagnosis not present

## 2016-05-02 DIAGNOSIS — D509 Iron deficiency anemia, unspecified: Secondary | ICD-10-CM | POA: Diagnosis not present

## 2016-05-02 DIAGNOSIS — D631 Anemia in chronic kidney disease: Secondary | ICD-10-CM | POA: Diagnosis not present

## 2016-05-04 DIAGNOSIS — D509 Iron deficiency anemia, unspecified: Secondary | ICD-10-CM | POA: Diagnosis not present

## 2016-05-04 DIAGNOSIS — N2581 Secondary hyperparathyroidism of renal origin: Secondary | ICD-10-CM | POA: Diagnosis not present

## 2016-05-04 DIAGNOSIS — N186 End stage renal disease: Secondary | ICD-10-CM | POA: Diagnosis not present

## 2016-05-04 DIAGNOSIS — D631 Anemia in chronic kidney disease: Secondary | ICD-10-CM | POA: Diagnosis not present

## 2016-05-07 DIAGNOSIS — N186 End stage renal disease: Secondary | ICD-10-CM | POA: Diagnosis not present

## 2016-05-07 DIAGNOSIS — D631 Anemia in chronic kidney disease: Secondary | ICD-10-CM | POA: Diagnosis not present

## 2016-05-07 DIAGNOSIS — N2581 Secondary hyperparathyroidism of renal origin: Secondary | ICD-10-CM | POA: Diagnosis not present

## 2016-05-07 DIAGNOSIS — D509 Iron deficiency anemia, unspecified: Secondary | ICD-10-CM | POA: Diagnosis not present

## 2016-05-08 ENCOUNTER — Encounter: Payer: Self-pay | Admitting: Gastroenterology

## 2016-05-09 DIAGNOSIS — N2581 Secondary hyperparathyroidism of renal origin: Secondary | ICD-10-CM | POA: Diagnosis not present

## 2016-05-09 DIAGNOSIS — D631 Anemia in chronic kidney disease: Secondary | ICD-10-CM | POA: Diagnosis not present

## 2016-05-09 DIAGNOSIS — D509 Iron deficiency anemia, unspecified: Secondary | ICD-10-CM | POA: Diagnosis not present

## 2016-05-09 DIAGNOSIS — N186 End stage renal disease: Secondary | ICD-10-CM | POA: Diagnosis not present

## 2016-05-11 DIAGNOSIS — D509 Iron deficiency anemia, unspecified: Secondary | ICD-10-CM | POA: Diagnosis not present

## 2016-05-11 DIAGNOSIS — N2581 Secondary hyperparathyroidism of renal origin: Secondary | ICD-10-CM | POA: Diagnosis not present

## 2016-05-11 DIAGNOSIS — N186 End stage renal disease: Secondary | ICD-10-CM | POA: Diagnosis not present

## 2016-05-11 DIAGNOSIS — D631 Anemia in chronic kidney disease: Secondary | ICD-10-CM | POA: Diagnosis not present

## 2016-05-14 ENCOUNTER — Other Ambulatory Visit: Payer: Self-pay | Admitting: Gastroenterology

## 2016-05-14 DIAGNOSIS — D631 Anemia in chronic kidney disease: Secondary | ICD-10-CM | POA: Diagnosis not present

## 2016-05-14 DIAGNOSIS — D509 Iron deficiency anemia, unspecified: Secondary | ICD-10-CM | POA: Diagnosis not present

## 2016-05-14 DIAGNOSIS — N2581 Secondary hyperparathyroidism of renal origin: Secondary | ICD-10-CM | POA: Diagnosis not present

## 2016-05-14 DIAGNOSIS — N186 End stage renal disease: Secondary | ICD-10-CM | POA: Diagnosis not present

## 2016-05-16 DIAGNOSIS — N186 End stage renal disease: Secondary | ICD-10-CM | POA: Diagnosis not present

## 2016-05-16 DIAGNOSIS — D631 Anemia in chronic kidney disease: Secondary | ICD-10-CM | POA: Diagnosis not present

## 2016-05-16 DIAGNOSIS — N2581 Secondary hyperparathyroidism of renal origin: Secondary | ICD-10-CM | POA: Diagnosis not present

## 2016-05-16 DIAGNOSIS — D509 Iron deficiency anemia, unspecified: Secondary | ICD-10-CM | POA: Diagnosis not present

## 2016-05-18 DIAGNOSIS — N2581 Secondary hyperparathyroidism of renal origin: Secondary | ICD-10-CM | POA: Diagnosis not present

## 2016-05-18 DIAGNOSIS — D509 Iron deficiency anemia, unspecified: Secondary | ICD-10-CM | POA: Diagnosis not present

## 2016-05-18 DIAGNOSIS — D631 Anemia in chronic kidney disease: Secondary | ICD-10-CM | POA: Diagnosis not present

## 2016-05-18 DIAGNOSIS — N186 End stage renal disease: Secondary | ICD-10-CM | POA: Diagnosis not present

## 2016-05-21 DIAGNOSIS — T8612 Kidney transplant failure: Secondary | ICD-10-CM | POA: Diagnosis not present

## 2016-05-21 DIAGNOSIS — N186 End stage renal disease: Secondary | ICD-10-CM | POA: Diagnosis not present

## 2016-05-21 DIAGNOSIS — D631 Anemia in chronic kidney disease: Secondary | ICD-10-CM | POA: Diagnosis not present

## 2016-05-21 DIAGNOSIS — N2581 Secondary hyperparathyroidism of renal origin: Secondary | ICD-10-CM | POA: Diagnosis not present

## 2016-05-21 DIAGNOSIS — Z992 Dependence on renal dialysis: Secondary | ICD-10-CM | POA: Diagnosis not present

## 2016-05-21 DIAGNOSIS — D509 Iron deficiency anemia, unspecified: Secondary | ICD-10-CM | POA: Diagnosis not present

## 2016-05-23 DIAGNOSIS — N2581 Secondary hyperparathyroidism of renal origin: Secondary | ICD-10-CM | POA: Diagnosis not present

## 2016-05-23 DIAGNOSIS — D631 Anemia in chronic kidney disease: Secondary | ICD-10-CM | POA: Diagnosis not present

## 2016-05-23 DIAGNOSIS — N186 End stage renal disease: Secondary | ICD-10-CM | POA: Diagnosis not present

## 2016-05-25 DIAGNOSIS — N2581 Secondary hyperparathyroidism of renal origin: Secondary | ICD-10-CM | POA: Diagnosis not present

## 2016-05-25 DIAGNOSIS — D631 Anemia in chronic kidney disease: Secondary | ICD-10-CM | POA: Diagnosis not present

## 2016-05-25 DIAGNOSIS — N186 End stage renal disease: Secondary | ICD-10-CM | POA: Diagnosis not present

## 2016-05-28 DIAGNOSIS — N186 End stage renal disease: Secondary | ICD-10-CM | POA: Diagnosis not present

## 2016-05-28 DIAGNOSIS — D631 Anemia in chronic kidney disease: Secondary | ICD-10-CM | POA: Diagnosis not present

## 2016-05-28 DIAGNOSIS — N2581 Secondary hyperparathyroidism of renal origin: Secondary | ICD-10-CM | POA: Diagnosis not present

## 2016-05-30 DIAGNOSIS — D631 Anemia in chronic kidney disease: Secondary | ICD-10-CM | POA: Diagnosis not present

## 2016-05-30 DIAGNOSIS — N2581 Secondary hyperparathyroidism of renal origin: Secondary | ICD-10-CM | POA: Diagnosis not present

## 2016-05-30 DIAGNOSIS — N186 End stage renal disease: Secondary | ICD-10-CM | POA: Diagnosis not present

## 2016-06-01 DIAGNOSIS — D631 Anemia in chronic kidney disease: Secondary | ICD-10-CM | POA: Diagnosis not present

## 2016-06-01 DIAGNOSIS — N2581 Secondary hyperparathyroidism of renal origin: Secondary | ICD-10-CM | POA: Diagnosis not present

## 2016-06-01 DIAGNOSIS — N186 End stage renal disease: Secondary | ICD-10-CM | POA: Diagnosis not present

## 2016-06-04 DIAGNOSIS — D631 Anemia in chronic kidney disease: Secondary | ICD-10-CM | POA: Diagnosis not present

## 2016-06-04 DIAGNOSIS — N2581 Secondary hyperparathyroidism of renal origin: Secondary | ICD-10-CM | POA: Diagnosis not present

## 2016-06-04 DIAGNOSIS — N186 End stage renal disease: Secondary | ICD-10-CM | POA: Diagnosis not present

## 2016-06-06 DIAGNOSIS — N186 End stage renal disease: Secondary | ICD-10-CM | POA: Diagnosis not present

## 2016-06-06 DIAGNOSIS — D631 Anemia in chronic kidney disease: Secondary | ICD-10-CM | POA: Diagnosis not present

## 2016-06-06 DIAGNOSIS — N2581 Secondary hyperparathyroidism of renal origin: Secondary | ICD-10-CM | POA: Diagnosis not present

## 2016-06-08 DIAGNOSIS — N2581 Secondary hyperparathyroidism of renal origin: Secondary | ICD-10-CM | POA: Diagnosis not present

## 2016-06-08 DIAGNOSIS — D631 Anemia in chronic kidney disease: Secondary | ICD-10-CM | POA: Diagnosis not present

## 2016-06-08 DIAGNOSIS — N186 End stage renal disease: Secondary | ICD-10-CM | POA: Diagnosis not present

## 2016-06-11 DIAGNOSIS — D631 Anemia in chronic kidney disease: Secondary | ICD-10-CM | POA: Diagnosis not present

## 2016-06-11 DIAGNOSIS — N186 End stage renal disease: Secondary | ICD-10-CM | POA: Diagnosis not present

## 2016-06-11 DIAGNOSIS — N2581 Secondary hyperparathyroidism of renal origin: Secondary | ICD-10-CM | POA: Diagnosis not present

## 2016-06-13 DIAGNOSIS — N186 End stage renal disease: Secondary | ICD-10-CM | POA: Diagnosis not present

## 2016-06-13 DIAGNOSIS — N2581 Secondary hyperparathyroidism of renal origin: Secondary | ICD-10-CM | POA: Diagnosis not present

## 2016-06-13 DIAGNOSIS — D631 Anemia in chronic kidney disease: Secondary | ICD-10-CM | POA: Diagnosis not present

## 2016-06-15 DIAGNOSIS — N186 End stage renal disease: Secondary | ICD-10-CM | POA: Diagnosis not present

## 2016-06-15 DIAGNOSIS — N2581 Secondary hyperparathyroidism of renal origin: Secondary | ICD-10-CM | POA: Diagnosis not present

## 2016-06-15 DIAGNOSIS — D631 Anemia in chronic kidney disease: Secondary | ICD-10-CM | POA: Diagnosis not present

## 2016-06-18 DIAGNOSIS — N2581 Secondary hyperparathyroidism of renal origin: Secondary | ICD-10-CM | POA: Diagnosis not present

## 2016-06-18 DIAGNOSIS — D631 Anemia in chronic kidney disease: Secondary | ICD-10-CM | POA: Diagnosis not present

## 2016-06-18 DIAGNOSIS — N186 End stage renal disease: Secondary | ICD-10-CM | POA: Diagnosis not present

## 2016-06-19 ENCOUNTER — Encounter (HOSPITAL_COMMUNITY): Payer: Self-pay

## 2016-06-19 ENCOUNTER — Emergency Department (HOSPITAL_COMMUNITY)
Admission: EM | Admit: 2016-06-19 | Discharge: 2016-06-20 | Disposition: A | Discharge status: Home / Self Care | Payer: Medicare Other | Attending: Emergency Medicine | Admitting: Emergency Medicine

## 2016-06-19 ENCOUNTER — Telehealth: Payer: Self-pay | Admitting: *Deleted

## 2016-06-19 DIAGNOSIS — Y929 Unspecified place or not applicable: Secondary | ICD-10-CM | POA: Insufficient documentation

## 2016-06-19 DIAGNOSIS — W57XXXA Bitten or stung by nonvenomous insect and other nonvenomous arthropods, initial encounter: Secondary | ICD-10-CM | POA: Insufficient documentation

## 2016-06-19 DIAGNOSIS — Y999 Unspecified external cause status: Secondary | ICD-10-CM | POA: Insufficient documentation

## 2016-06-19 DIAGNOSIS — N186 End stage renal disease: Secondary | ICD-10-CM | POA: Insufficient documentation

## 2016-06-19 DIAGNOSIS — R110 Nausea: Secondary | ICD-10-CM | POA: Insufficient documentation

## 2016-06-19 DIAGNOSIS — Z87891 Personal history of nicotine dependence: Secondary | ICD-10-CM | POA: Insufficient documentation

## 2016-06-19 DIAGNOSIS — S60569A Insect bite (nonvenomous) of unspecified hand, initial encounter: Secondary | ICD-10-CM | POA: Diagnosis not present

## 2016-06-19 DIAGNOSIS — J45909 Unspecified asthma, uncomplicated: Secondary | ICD-10-CM | POA: Insufficient documentation

## 2016-06-19 DIAGNOSIS — E1142 Type 2 diabetes mellitus with diabetic polyneuropathy: Secondary | ICD-10-CM | POA: Diagnosis not present

## 2016-06-19 DIAGNOSIS — E1122 Type 2 diabetes mellitus with diabetic chronic kidney disease: Secondary | ICD-10-CM | POA: Insufficient documentation

## 2016-06-19 DIAGNOSIS — Y939 Activity, unspecified: Secondary | ICD-10-CM | POA: Insufficient documentation

## 2016-06-19 DIAGNOSIS — R51 Headache: Secondary | ICD-10-CM | POA: Insufficient documentation

## 2016-06-19 DIAGNOSIS — R519 Headache, unspecified: Secondary | ICD-10-CM

## 2016-06-19 DIAGNOSIS — I120 Hypertensive chronic kidney disease with stage 5 chronic kidney disease or end stage renal disease: Secondary | ICD-10-CM | POA: Insufficient documentation

## 2016-06-19 DIAGNOSIS — R21 Rash and other nonspecific skin eruption: Secondary | ICD-10-CM | POA: Diagnosis not present

## 2016-06-19 DIAGNOSIS — Z940 Kidney transplant status: Secondary | ICD-10-CM | POA: Insufficient documentation

## 2016-06-19 DIAGNOSIS — Z992 Dependence on renal dialysis: Secondary | ICD-10-CM | POA: Insufficient documentation

## 2016-06-19 DIAGNOSIS — R112 Nausea with vomiting, unspecified: Secondary | ICD-10-CM | POA: Diagnosis not present

## 2016-06-19 DIAGNOSIS — Z79899 Other long term (current) drug therapy: Secondary | ICD-10-CM | POA: Insufficient documentation

## 2016-06-19 DIAGNOSIS — Z7982 Long term (current) use of aspirin: Secondary | ICD-10-CM | POA: Insufficient documentation

## 2016-06-19 LAB — LIPASE, BLOOD: Lipase: 84 U/L — ABNORMAL HIGH (ref 11–51)

## 2016-06-19 LAB — CBC
HEMATOCRIT: 34.5 % — AB (ref 39.0–52.0)
HEMOGLOBIN: 11.2 g/dL — AB (ref 13.0–17.0)
MCH: 31.3 pg (ref 26.0–34.0)
MCHC: 32.5 g/dL (ref 30.0–36.0)
MCV: 96.4 fL (ref 78.0–100.0)
Platelets: 129 10*3/uL — ABNORMAL LOW (ref 150–400)
RBC: 3.58 MIL/uL — ABNORMAL LOW (ref 4.22–5.81)
RDW: 14.5 % (ref 11.5–15.5)
WBC: 5.4 10*3/uL (ref 4.0–10.5)

## 2016-06-19 LAB — COMPREHENSIVE METABOLIC PANEL
ALBUMIN: 3.8 g/dL (ref 3.5–5.0)
ALT: 21 U/L (ref 17–63)
ANION GAP: 12 (ref 5–15)
AST: 17 U/L (ref 15–41)
Alkaline Phosphatase: 67 U/L (ref 38–126)
BUN: 37 mg/dL — AB (ref 6–20)
CHLORIDE: 98 mmol/L — AB (ref 101–111)
CO2: 28 mmol/L (ref 22–32)
Calcium: 9.2 mg/dL (ref 8.9–10.3)
Creatinine, Ser: 11.71 mg/dL — ABNORMAL HIGH (ref 0.61–1.24)
GFR calc Af Amer: 5 mL/min — ABNORMAL LOW (ref >60)
GFR calc non Af Amer: 4 mL/min — ABNORMAL LOW (ref >60)
GLUCOSE: 129 mg/dL — AB (ref 65–99)
POTASSIUM: 3.7 mmol/L (ref 3.5–5.1)
SODIUM: 138 mmol/L (ref 135–145)
Total Bilirubin: 0.7 mg/dL (ref 0.3–1.2)
Total Protein: 6.8 g/dL (ref 6.5–8.1)

## 2016-06-19 MED ORDER — DOXYCYCLINE HYCLATE 100 MG PO TABS
100.0000 mg | ORAL_TABLET | Freq: Once | ORAL | Status: AC
  Administered 2016-06-20: 100 mg via ORAL
  Filled 2016-06-19: qty 1

## 2016-06-19 MED ORDER — ONDANSETRON 4 MG PO TBDP
8.0000 mg | ORAL_TABLET | Freq: Once | ORAL | Status: AC
  Administered 2016-06-19: 8 mg via ORAL
  Filled 2016-06-19: qty 2

## 2016-06-19 NOTE — ED Provider Notes (Signed)
[facility] DEPT Provider Note   CSN: [id number] Arrival date & time: 06/19/16  1747     History   Chief Complaint Chief Complaint  Patient presents with  . Rash  . Headache    HPI Kirk FRIEDEN is a 56 y.o. male.  Patient with history of ESRD-HD, HTN, HLD, DM presents with symptoms of headache, off and on for several days, nausea, intermittent abdominal pain and hot/cold chills. Wife reports periods of confusion and disorientation. About 2 weeks ago he went fishing and had several tick bites. He denies rash. He has not taken his temperature at home. His doctor thought it may be due to Benadryl, which he takes daily for itching secondary to hemodialysis and has for years, but reports no change in symptoms by stopping. No chest pain, SOB, cough, urinary symptoms.    The history is provided by the patient and the spouse. No language interpreter was used.  Rash    Headache   Associated symptoms include a fever and nausea. Pertinent negatives include no shortness of breath and no vomiting.    Past Medical History:  Diagnosis Date  . Acute edema of lung, unspecified   . Acute, but ill-defined, cerebrovascular disease   . 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)   . Anxiety   . Asthma   . Carpal tunnel syndrome   . Cellulitis and abscess of trunk   . Cholelithiasis 07/13/2014  . Chronic headaches   . Debility, unspecified   . Dermatophytosis of the body   . Dysrhythmia    history of  . Edema   . End stage renal disease on dialysis [facility])    "MWF; [facility]" (10/21/2014)  . Essential hypertension, benign   . Gout, unspecified   . HTN (hypertension)   . Hypertrophy of prostate without urinary obstruction and other lower urinary tract symptoms (LUTS)   . Hypotension, unspecified   . Impotence of organic origin   . Insomnia, unspecified   . Kidney replaced by transplant   . Localization-related (focal) (partial) epilepsy and epileptic syndromes with  complex partial seizures, without mention of intractable epilepsy   . Lumbago   . Memory loss   . OSA on CPAP   . Other and unspecified hyperlipidemia   . Other chronic nonalcoholic liver disease   . Other malaise and fatigue   . Other nonspecific abnormal serum enzyme levels   . Pain in joint, lower leg   . Pain in joint, upper arm   . Pneumonia "several times"  . Renal dialysis status(V45.11) 02/05/2010   restarted 01/02/13 ofter renal trransplant failure  . Secondary hyperparathyroidism (of renal origin)   . Shortness of breath   . Tension headache   . Unspecified constipation   . Unspecified essential hypertension   . Unspecified hereditary and idiopathic peripheral neuropathy   . Unspecified vitamin D deficiency     Patient Active Problem List   Diagnosis Date Noted  . Nausea without vomiting 12/14/2015  . End stage renal disease ([place])   . Palpitations 06/29/2015  . Need for hepatitis C screening test 06/29/2015  . Fatigue 06/29/2015  . Myalgia 05/17/2015  . 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 Obesity hypoventilation syndrome ([place]) 12/09/2014  . Extrinsic asthma 12/09/2014  . Narcotic drug use 12/09/2014  . Hypersomnia with sleep apnea 12/09/2014  . SCC (squamous cell carcinoma), arm 11/09/2014  . Mild persistent chronic asthma without complication 08/67/6195  . ESRD on dialysis ([place]) 09/09/2014  . HLD (  hyperlipidemia) 09/09/2014  . Dyspnea 06/23/2014  . Pancytopenia ([place]) 04/28/2014  . Thrombocytopenia ([place]) 04/10/2014  . Fungal dermatitis 03/17/2014  . History of surgical procedure 09/17/2013  . Headache(784.0) 04/28/2013  . Awaiting organ transplant 02/13/2013  . HTN (hypertension)   . DM (diabetes mellitus), type 2 with renal complications ([place])   . Tension headache   . Memory loss   . Anemia in chronic renal disease   . Edema   . Pain in joint, lower leg 05/08/2012  . Thoracic or lumbosacral neuritis or radiculitis 03/27/2012  . Nerve root pain  03/27/2012  . History of kidney transplant 09/10/2011  . Hearing loss 08/16/2011  . Obesity 08/16/2011  . Difficulty hearing 08/16/2011  . OSA on CPAP 08/05/2011    Past Surgical History:  Procedure Laterality Date  . AV FISTULA PLACEMENT Left ?2010   "forearm; at [facility]"  . BACK SURGERY    . CARDIAC CATHETERIZATION  03/21/2011  . CHOLECYSTECTOMY N/A 10/21/2014   Procedure: LAPAROSCOPIC CHOLECYSTECTOMY WITH INTRAOPERATIVE CHOLANGIOGRAM;  Surgeon: Autumn Messing III, MD;  Location: [place];  Service: General;  Laterality: N/A;  . COLONOSCOPY    . 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 KIDNEY TRANSPLANT  08/17/2011   [place]  10/21/2014   w/IOC  . LEFT HEART CATHETERIZATION WITH CORONARY ANGIOGRAM N/A 03/21/2011   Procedure: LEFT HEART CATHETERIZATION WITH CORONARY ANGIOGRAM;  Surgeon: [name], MD;  Location: [facility] CATH LAB;  Service: Cardiovascular;  Laterality: N/A;  . NEPHRECTOMY  08/2013   removed transplaned kidney  . POSTERIOR FUSION CERVICAL SPINE  06/25/2012   for spinal stenosis  . VASECTOMY  2010       Home Medications    Prior to Admission medications   Medication Sig Start Date End Date Taking? Authorizing Provider  acetaminophen (TYLENOL) 500 MG tablet Take 1 tablet (500 mg total) by mouth every 6 (six) hours as needed for moderate pain. 09/12/14  Yes Samuella Cota, MD  amLODipine (NORVASC) 10 MG tablet One twice daily to control BP 02/15/16  Yes Estill Dooms, MD  aspirin EC 81 MG tablet Take 81 mg by mouth daily.   Yes [provider]  b complex-vitamin c-folic acid (NEPHRO-VITE) 0.8 MG TABS tablet TAKE 1 TABLET BY MOUTH EVERY DAY 03/29/16  Yes Estill Dooms, MD  budesonide-formoterol [facility]) 80-4.5 MCG/ACT inhaler Inhale 2 puffs into the lungs 2 (two) times daily. Patient taking differently: Inhale 2 puffs into the lungs 2 (two) times daily as needed (shortness of breath).  09/21/14   Yes Bonnielee Haff, MD  calcitRIOL (ROCALTROL) 0.25 MCG capsule Take 1 capsule (0.25 mcg total) by mouth every Monday, Wednesday, and Friday with hemodialysis. 09/21/14  Yes Bonnielee Haff, MD  Cholecalciferol (VITAMIN D3) 5000 UNITS TABS Take 1 tablet by mouth daily.   Yes [provider]  diphenhydrAMINE (BENADRYL) 25 MG tablet Take 50 mg by mouth 2 (two) times daily as needed for allergies.    Yes [provider]  guaiFENesin-dextromethorphan (ROBITUSSIN DM) 100-10 MG/5ML syrup Take 10 mLs by mouth every 4 (four) hours as needed for cough.   Yes [provider]  hydrALAZINE (APRESOLINE) 25 MG tablet One twice daily to regulate BP 02/15/16  Yes Estill Dooms, MD  ipratropium-albuterol (DUONEB) 0.5-2.5 (3) MG/3ML SOLN Take 3 mLs by nebulization 3 (three) times daily. ICD 10 code J44.9 Patient taking differently: Take 3 mLs by nebulization every 6 (six)  hours as needed (shortness of breath). ICD 10 code J44.9 12/11/15  Yes Kathie Dike, MD  metoCLOPramide (REGLAN) 10 MG tablet One before each meal to increase stomach emptying Patient taking differently: Take 10 mg by mouth 3 (three) times daily as needed for nausea or vomiting. to increase stomach emptying. Takes as needed` 12/14/15  Yes Estill Dooms, MD  metoprolol (LOPRESSOR) 50 MG tablet One twice daily to regulate BP 02/15/16  Yes Estill Dooms, MD  omeprazole (PRILOSEC) 20 MG capsule TAKE 1 CAPSULE (20 MG TOTAL) BY MOUTH 2 (TWO) TIMES DAILY BEFORE A MEAL. 05/14/16  Yes Milus Banister, MD  pravastatin (PRAVACHOL) 40 MG tablet TAKE 1 TABLET EVERY EVENING 12/19/15  Yes Estill Dooms, MD  promethazine (PHENERGAN) 25 MG tablet TAKE 1 TABLET BY MOUTH EVERY 8 HOURS AS NEEDED FOR NAUSEA AND VOMITING.. CAN FILL 08/10 01/25/16  Yes Estill Dooms, MD  traMADol Veatrice Bourbon) 50 MG tablet One up to 4 times daly if needed for pain 12/14/15  Yes Estill Dooms, MD    Family History Family History  Problem Relation Age of  Onset  . Adopted: Yes  . Colon cancer Neg Hx   . Esophageal cancer Neg Hx   . Rectal cancer Neg Hx   . Stomach cancer Neg Hx     Social History Social History  Substance Use Topics  . Smoking status: Former Smoker    Packs/day: 0.50    Years: 32.00    Types: Cigarettes    Quit date: 08/23/2014  . Smokeless tobacco: Never Used  . Alcohol use No     Allergies   Codeine   Review of Systems Review of Systems  Constitutional: Positive for chills and fever.  HENT: Negative.   Respiratory: Negative.  Negative for cough and shortness of breath.   Cardiovascular: Negative.  Negative for chest pain.  Gastrointestinal: Positive for abdominal pain and nausea. Negative for vomiting.  Genitourinary: Negative for dysuria.  Musculoskeletal: Negative.  Negative for myalgias.  Skin: Negative for rash (Has redness at site of bites without spreading rash).  Neurological: Positive for headaches.  Psychiatric/Behavioral: Positive for confusion.     Physical Exam Updated Vital Signs BP (!) 164/68 (BP Location: Right Arm)   Pulse 74   Temp 98.2 F (36.8 C) (Oral)   Resp 19   SpO2 98%   Physical Exam  Constitutional: He is oriented to person, place, and time. He appears well-developed and well-nourished.  HENT:  Head: Normocephalic.  Neck: Normal range of motion. Neck supple.  Cardiovascular: Normal rate and regular rhythm.   No murmur heard. Pulmonary/Chest: Effort normal and breath sounds normal. He has no wheezes. He has no rales.  Abdominal: Soft. Bowel sounds are normal. There is no tenderness. There is no rebound and no guarding.  Musculoskeletal: Normal range of motion.  Neurological: He is alert and oriented to person, place, and time.  CN's 3-12 grossly intact. Speech is clear and focused. No facial asymmetry. No lateralizing weakness. No deficits of coordination. Ambulatory without imbalance.    Skin: Skin is warm and dry. No rash noted.  Rash limited to palmar and  plantar surfaces. Maculopapular, slight raised, non-blistering.  Psychiatric: He has a normal mood and affect.     ED Treatments / Results  Labs (all labs ordered are listed, but only abnormal results are displayed) Labs Reviewed  LIPASE, BLOOD - Abnormal; Notable for the following:       Result Value   Lipase  84 (*)    All other components within normal limits  COMPREHENSIVE METABOLIC PANEL - Abnormal; Notable for the following:    Chloride 98 (*)    Glucose, Bld 129 (*)    BUN 37 (*)    Creatinine, Ser 11.71 (*)    GFR calc non Af Amer 4 (*)    GFR calc Af Amer 5 (*)    All other components within normal limits  CBC - Abnormal; Notable for the following:    RBC 3.58 (*)    Hemoglobin 11.2 (*)    HCT 34.5 (*)    Platelets 129 (*)    All other components within normal limits    EKG  EKG Interpretation None       Radiology No results found.  Procedures Procedures (including critical care time)  Medications Ordered in ED Medications  ondansetron (ZOFRAN-ODT) disintegrating tablet 8 mg (not administered)     Initial Impression / Assessment and Plan / ED Course  I have reviewed the triage vital signs and the nursing notes.  Pertinent labs & imaging results that were available during my care of the patient were reviewed by me and considered in my medical decision making (see chart for details).     Patient here for evaluation of headache, nausea, subjective fever and bouts of confusion for several days. Bitten by multiple ticks within the last 2 weeks.   Labs are at baseline for dialysis patient. No indication for urgent dialysis. He has symptoms and exam findings concerning for RMSF. Titers ([place] and RMSF) pending. Will start on Doxycycline, provide Zofran. He can be discharged home and patient and family comfortable with plan. Return precautions discussed.   Final Clinical Impressions(s) / ED Diagnoses   Final diagnoses:  None   1. Headache 2. Nausea  without vomiting 3. Rash 4. Tick bites  New Prescriptions New Prescriptions   No medications on file     Charlann Lange, Hershal Coria 06/20/16 0034    Quintella Reichert, MD 06/20/16 1434

## 2016-06-19 NOTE — ED Triage Notes (Signed)
Pt states he has had multiple tick bites. He reports he has had intermittent headaches, abd pain, and chills. Pt is a MWF dialysis patient.

## 2016-06-19 NOTE — Telephone Encounter (Signed)
Agree  Thank you

## 2016-06-19 NOTE — Telephone Encounter (Signed)
Patient wife, Kirk [name] called and stated that patient was bit by a tick this weekend and showing signs of Confusion, Nausea, Vomiting and severe abdominal pain. No available appointments. Instructed wife to take patient to Urgent Care. She agreed.

## 2016-06-20 ENCOUNTER — Ambulatory Visit: Payer: Medicare Other | Admitting: Internal Medicine

## 2016-06-20 ENCOUNTER — Other Ambulatory Visit: Payer: Self-pay | Admitting: Gastroenterology

## 2016-06-20 DIAGNOSIS — N2581 Secondary hyperparathyroidism of renal origin: Secondary | ICD-10-CM | POA: Diagnosis not present

## 2016-06-20 DIAGNOSIS — S60569A Insect bite (nonvenomous) of unspecified hand, initial encounter: Secondary | ICD-10-CM | POA: Diagnosis not present

## 2016-06-20 DIAGNOSIS — N186 End stage renal disease: Secondary | ICD-10-CM | POA: Diagnosis not present

## 2016-06-20 DIAGNOSIS — D631 Anemia in chronic kidney disease: Secondary | ICD-10-CM | POA: Diagnosis not present

## 2016-06-20 MED ORDER — ONDANSETRON 4 MG PO TBDP: 4.0000 mg | ORAL_TABLET | Freq: Three times a day (TID) | ORAL | 0 refills | Status: DC | PRN

## 2016-06-20 MED ORDER — DOXYCYCLINE HYCLATE 100 MG PO CAPS: 100.0000 mg | ORAL_CAPSULE | Freq: Two times a day (BID) | ORAL | 0 refills | Status: AC

## 2016-06-20 NOTE — ED Notes (Signed)
Pt given specimen cup, informed of need for urine sample.

## 2016-06-21 ENCOUNTER — Ambulatory Visit (INDEPENDENT_AMBULATORY_CARE_PROVIDER_SITE_OTHER): Payer: Medicare Other | Admitting: Nurse Practitioner

## 2016-06-21 ENCOUNTER — Encounter: Payer: Self-pay | Admitting: Nurse Practitioner

## 2016-06-21 VITALS — BP 180/86 | HR 72 | Temp 98.0°F | Resp 17 | Ht 67.0 in | Wt 221.6 lb

## 2016-06-21 DIAGNOSIS — R5383 Other fatigue: Secondary | ICD-10-CM | POA: Diagnosis not present

## 2016-06-21 DIAGNOSIS — I10 Essential (primary) hypertension: Secondary | ICD-10-CM

## 2016-06-21 DIAGNOSIS — Z992 Dependence on renal dialysis: Secondary | ICD-10-CM | POA: Diagnosis not present

## 2016-06-21 DIAGNOSIS — W57XXXD Bitten or stung by nonvenomous insect and other nonvenomous arthropods, subsequent encounter: Secondary | ICD-10-CM

## 2016-06-21 DIAGNOSIS — G4489 Other headache syndrome: Secondary | ICD-10-CM | POA: Diagnosis not present

## 2016-06-21 DIAGNOSIS — T8612 Kidney transplant failure: Secondary | ICD-10-CM | POA: Diagnosis not present

## 2016-06-21 DIAGNOSIS — N186 End stage renal disease: Secondary | ICD-10-CM | POA: Diagnosis not present

## 2016-06-21 MED ORDER — AMLODIPINE BESYLATE 10 MG PO TABS: ORAL_TABLET | ORAL | 3 refills | Status: DC

## 2016-06-21 MED ORDER — HYDRALAZINE HCL 25 MG PO TABS: ORAL_TABLET | ORAL | 3 refills | Status: DC

## 2016-06-21 MED ORDER — PRAVASTATIN SODIUM 40 MG PO TABS: 40.0000 mg | ORAL_TABLET | Freq: Every evening | ORAL | 1 refills | Status: DC

## 2016-06-21 MED ORDER — METOPROLOL TARTRATE 50 MG PO TABS: ORAL_TABLET | ORAL | 3 refills | Status: DC

## 2016-06-21 NOTE — Progress Notes (Signed)
Careteam: Patient Care Team: Estill Dooms, MD as PCP - General (Internal Medicine) Jovita Kussmaul, MD as Consulting Physician (General Surgery) [name], MD as Consulting Physician (Pulmonary Disease) Estanislado Emms, MD as Consulting Physician (Nephrology) Elam Dutch, MD as Consulting Physician (Vascular Surgery) [name], MD as Consulting Physician (Cardiology) Milus Banister, MD as Attending Physician (Gastroenterology)  Advanced Directive information Does Patient Have a Medical Advance Directive?: No  Allergies  Allergen Reactions  . Codeine Nausea And Vomiting and Nausea Only    Altered mental status    Chief Complaint  Patient presents with  . Follow-up    Pt is being seen for follow up from [facility] ED visit on 06/19/16 due to [place] Fever. Pt had 12 ticks discovered over 2 week period.     HPI: Patient is a 56 y.o. male seen in the office today to follow up ED visit.  Pt with history of ESRD-HD, HTN, HLD, DM. Pt went to the hospital with symptoms of headache, off and on for several days, nausea, intermittent abdominal pain and hot/cold chills. Wife reports periods of confusion and disorientation. About 2 weeks ago he went fishing and had several tick bites. Concerns for lyme disease or RMSF. Titers are still pending. He was started on doxycycline (for 14 days) and zofran. Headache, confusion, and nausea have improved since on doxycycline.  Still lethargic but overall this has improved. Took zofran 2 days ago. (nausea medication does make him more lethargic)  Wife with him at visit today.  Pt falling a sleep in exam room- she reports he has been up at night. Last night he slept better.  Eating and drinking okay- smaller portions but still eating.  Blood pressure has been elevated at home. Wife reports blood pressure gets high when he does not feel well. Has taken blood pressure medication today.  Dialysis M, W, F. Blood pressure has  tendency to bottom out after dialysis.   Review of Systems:  Review of Systems  Constitutional: Positive for malaise/fatigue. Negative for chills, fever and weight loss.  HENT: Negative for tinnitus.   Respiratory: Negative for cough, sputum production and shortness of breath.   Cardiovascular: Negative for chest pain, palpitations and leg swelling.  Gastrointestinal: Positive for nausea (feels "sick on his stomach" no vomitting or feeling that he will vomitting). Negative for abdominal pain, constipation, diarrhea and heartburn.  Genitourinary:       HD- minimal urination  Musculoskeletal: Positive for myalgias.  Skin: Negative.   Neurological: Positive for headaches (better). Negative for dizziness.  Psychiatric/Behavioral: Positive for memory loss. Negative for depression. The patient does not have insomnia.     Past Medical History:  Diagnosis Date  . Acute edema of lung, unspecified   . Acute, but ill-defined, cerebrovascular disease   . 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)   . Anxiety   . Asthma   . Carpal tunnel syndrome   . Cellulitis and abscess of trunk   . Cholelithiasis 07/13/2014  . Chronic headaches   . Debility, unspecified   . Dermatophytosis of the body   . Dysrhythmia    history of  . Edema   . End stage renal disease on dialysis [facility])    "MWF; [facility]" (10/21/2014)  . Essential hypertension, benign   . Gout, unspecified   . HTN (hypertension)   . Hypertrophy of prostate without urinary obstruction and other lower urinary tract symptoms (LUTS)   .  Hypotension, unspecified   . Impotence of organic origin   . Insomnia, unspecified   . Kidney replaced by transplant   . Localization-related (focal) (partial) epilepsy and epileptic syndromes with complex partial seizures, without mention of intractable epilepsy   . Lumbago   . Memory loss   . OSA on CPAP   . Other and unspecified hyperlipidemia   . Other chronic nonalcoholic liver  disease   . Other malaise and fatigue   . Other nonspecific abnormal serum enzyme levels   . Pain in joint, lower leg   . Pain in joint, upper arm   . Pneumonia "several times"  . Renal dialysis status(V45.11) 02/05/2010   restarted 01/02/13 ofter renal trransplant failure  . Secondary hyperparathyroidism (of renal origin)   . Shortness of breath   . Tension headache   . Unspecified constipation   . Unspecified essential hypertension   . Unspecified hereditary and idiopathic peripheral neuropathy   . Unspecified vitamin D deficiency    Past Surgical History:  Procedure Laterality Date  . AV FISTULA PLACEMENT Left ?2010   "forearm; at [facility]"  . BACK SURGERY    . CARDIAC CATHETERIZATION  03/21/2011  . CHOLECYSTECTOMY N/A 10/21/2014   Procedure: LAPAROSCOPIC CHOLECYSTECTOMY WITH INTRAOPERATIVE CHOLANGIOGRAM;  Surgeon: Autumn Messing III, MD;  Location: [place];  Service: General;  Laterality: N/A;  . COLONOSCOPY    . 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 KIDNEY TRANSPLANT  08/17/2011   [place]  10/21/2014   w/IOC  . LEFT HEART CATHETERIZATION WITH CORONARY ANGIOGRAM N/A 03/21/2011   Procedure: LEFT HEART CATHETERIZATION WITH CORONARY ANGIOGRAM;  Surgeon: [name], MD;  Location: [facility] CATH LAB;  Service: Cardiovascular;  Laterality: N/A;  . NEPHRECTOMY  08/2013   removed transplaned kidney  . POSTERIOR FUSION CERVICAL SPINE  06/25/2012   for spinal stenosis  . VASECTOMY  2010   Social History:   reports that he has been smoking E-cigarettes.  He has a 16.00 pack-year smoking history. He has never used smokeless tobacco. He reports that he does not drink alcohol or use drugs.  Family History  Problem Relation Age of Onset  . Adopted: Yes  . Colon cancer Neg Hx   . Esophageal cancer Neg Hx   . Rectal cancer Neg Hx   . Stomach cancer Neg Hx     Medications: Patient's Medications  New Prescriptions   No  medications on file  Previous Medications   ACETAMINOPHEN (TYLENOL) 500 MG TABLET    Take 1 tablet (500 mg total) by mouth every 6 (six) hours as needed for moderate pain.   AMLODIPINE (NORVASC) 10 MG TABLET    One twice daily to control BP   ASPIRIN EC 81 MG TABLET    Take 81 mg by mouth daily.   B COMPLEX-VITAMIN C-FOLIC ACID (NEPHRO-VITE) 0.8 MG TABS TABLET    TAKE 1 TABLET BY MOUTH EVERY DAY   BUDESONIDE-FORMOTEROL (SYMBICORT) 80-4.5 MCG/ACT INHALER    Inhale 2 puffs into the lungs 2 (two) times daily.   CALCITRIOL (ROCALTROL) 0.25 MCG CAPSULE    Take 1 capsule (0.25 mcg total) by mouth every Monday, Wednesday, and Friday with hemodialysis.   CHOLECALCIFEROL (VITAMIN D3) 5000 UNITS TABS    Take 1 tablet by mouth daily.   DIPHENHYDRAMINE (BENADRYL) 25 MG TABLET    Take 50 mg by mouth 2 (two) times daily as needed for allergies.    DOXYCYCLINE (  VIBRAMYCIN) 100 MG CAPSULE    Take 1 capsule (100 mg total) by mouth 2 (two) times daily.   GUAIFENESIN-DEXTROMETHORPHAN (ROBITUSSIN DM) 100-10 MG/5ML SYRUP    Take 10 mLs by mouth every 4 (four) hours as needed for cough.   HYDRALAZINE (APRESOLINE) 25 MG TABLET    One twice daily to regulate BP   IPRATROPIUM-ALBUTEROL (DUONEB) 0.5-2.5 (3) MG/3ML SOLN    Take 3 mLs by nebulization 3 (three) times daily. ICD 10 code J44.9   METOCLOPRAMIDE (REGLAN) 10 MG TABLET    One before each meal to increase stomach emptying   METOPROLOL (LOPRESSOR) 50 MG TABLET    One twice daily to regulate BP   OMEPRAZOLE (PRILOSEC) 20 MG CAPSULE    TAKE 1 CAPSULE (20 MG TOTAL) BY MOUTH 2 (TWO) TIMES DAILY BEFORE A MEAL.   PRAVASTATIN (PRAVACHOL) 40 MG TABLET    TAKE 1 TABLET EVERY EVENING   PROMETHAZINE (PHENERGAN) 25 MG TABLET    TAKE 1 TABLET BY MOUTH EVERY 8 HOURS AS NEEDED FOR NAUSEA AND VOMITING.. CAN FILL 08/10   TRAMADOL (ULTRAM) 50 MG TABLET    One up to 4 times daly if needed for pain  Modified Medications   No medications on file  Discontinued Medications    ONDANSETRON (ZOFRAN ODT) 4 MG DISINTEGRATING TABLET    Take 1 tablet (4 mg total) by mouth every 8 (eight) hours as needed for nausea or vomiting.     Physical Exam:  Vitals:   06/21/16 1331  BP: (!) 180/86  Pulse: 72  Resp: 17  Temp: 98 F (36.7 C)  TempSrc: Oral  SpO2: 97%  Weight: 221 lb 9.6 oz (100.5 kg)  Height: 5\' 7"  (1.702 m)   Body mass index is 34.71 kg/m.  Physical Exam  Constitutional: He is oriented to person, place, and time.  Obese.  HENT:  Loss of hearing.  Eyes:  Corrective lenses  Neck: No JVD present. No tracheal deviation present. No thyromegaly present.  Cardiovascular: Exam reveals no gallop.   No murmur heard. AF. 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  Abdominal: Soft. Bowel sounds are normal.  Musculoskeletal: Normal range of motion. He exhibits no edema.  Lymphadenopathy:    He has no cervical adenopathy.  Neurological: He is alert and oriented to person, place, and time. No cranial nerve deficit. Coordination normal.  Skin: No rash noted. No erythema. No pallor.  Psychiatric:  Drowsy during the exam.    Labs reviewed: Basic Metabolic Panel:  Recent Labs  06/29/15 1533  12/09/15 0959 12/10/15 0509 12/11/15 0648 01/24/16 1740 02/14/16 0904 06/19/16 1800  NA  --   < > 136 136 134* 136 140 138  K  --   < > 4.5 4.2 5.0 3.3* 4.0 3.7  CL  --   < > 96* 97* 95* 94* 97* 98*  CO2  --   < > 28 28 24 30 26 28   GLUCOSE  --   < > 133* 230* 164* 134* 119* 129*  BUN  --   < > 38* 36* 70* 22* 52* 37*  CREATININE  --   < > 13.30* 9.42* 12.38* 8.99* 11.87* 11.71*  CALCIUM  --   < > 9.1 9.2 9.6 9.1 9.2 9.2  PHOS  --   < > 5.1* 1.9* 3.0  --   --   --   TSH 3.480  --   --   --   --   --   --   --   < > =  values in this interval not displayed. Liver Function Tests:  Recent Labs  10/04/15 0950  12/11/15 0648 02/14/16 0904 06/19/16 1800  AST 19  --   --  16 17  ALT 21  --   --  15 21  ALKPHOS 64  --   --  54 67  BILITOT 0.4  --   --  0.5 0.7  PROT  7.1  --   --  6.6 6.8  ALBUMIN 4.2  < > 3.8 3.9 3.8  < > = values in this interval not displayed.  Recent Labs  06/19/16 1800  LIPASE 84*   No results for input(s): AMMONIA in the last 8760 hours. CBC:  Recent Labs  10/04/15 0950  01/24/16 1740 02/14/16 0904 06/19/16 1800  WBC 4.9  < > 5.7 4.6 5.4  NEUTROABS 3.6  --  4.0 2,990  --   HGB 12.4*  < > 11.2* 11.7* 11.2*  HCT 35.8*  < > 33.5* 35.9* 34.5*  MCV 96.4  < > 98.2 96.8 96.4  PLT 119.0*  < > 138* 97* 129*  < > = values in this interval not displayed. Lipid Panel:  Recent Labs  06/29/15 1533  CHOL 195  HDL 17*  LDLCALC Comment  TRIG 1,576*  CHOLHDL 11.5*   TSH:  Recent Labs  06/29/15 1533  TSH 3.480   A1C: Lab Results  Component Value Date   HGBA1C 5.4 02/14/2016     Assessment/Plan Fatigue, unspecified type after Tick bite, subsequent encounter Symptoms of fatigue, nausea and headache after tick bite. Pt with increase fatigue during office visit however able to answer questions appropriately, wife reports sleep wake cycle is off and overall fatigue, nausea and headache has improved since ED visit.  To cont on doxycycline twice daily until complete. Awaiting results for lymes disease and RMSF  Essential hypertension -elevated today however has a tendency to drop after dialysis, will monitor at this time.  - amLODipine (NORVASC) 10 MG tablet; One twice daily to control BP  Dispense: 180 tablet; Refill: 3 - hydrALAZINE (APRESOLINE) 25 MG tablet; One twice daily to regulate BP  Dispense: 180 tablet; Refill: 3 - metoprolol tartrate (LOPRESSOR) 50 MG tablet; One twice daily to regulate BP  Dispense: 180 tablet; Refill: 3  4. Other headache syndrome Has improved at this time.   To keep follow up with Dr Nyoka Cowden on 07/03/16, follow up precautions given, pt and wife understand [facility] [employer]. Harle Battiest  [facility] [phone] 8 am - 5 pm) [phone] (after  hours)

## 2016-06-21 NOTE — Patient Instructions (Signed)
Cont doxycycline until complete.  If symptoms worsen to seek medical attention immediately

## 2016-06-22 DIAGNOSIS — N186 End stage renal disease: Secondary | ICD-10-CM | POA: Diagnosis not present

## 2016-06-22 DIAGNOSIS — N2581 Secondary hyperparathyroidism of renal origin: Secondary | ICD-10-CM | POA: Diagnosis not present

## 2016-06-22 LAB — ROCKY MTN SPOTTED FVR ABS PNL(IGG+IGM)
RMSF IGM: 0.48 {index} (ref 0.00–0.89)
RMSF IgG: POSITIVE — AB

## 2016-06-22 LAB — RMSF, IGG, IFA: RMSF, IGG, IFA: 1:64 {titer} — ABNORMAL HIGH

## 2016-06-25 DIAGNOSIS — N2581 Secondary hyperparathyroidism of renal origin: Secondary | ICD-10-CM | POA: Diagnosis not present

## 2016-06-25 DIAGNOSIS — N186 End stage renal disease: Secondary | ICD-10-CM | POA: Diagnosis not present

## 2016-06-27 ENCOUNTER — Encounter: Payer: Self-pay | Admitting: Gastroenterology

## 2016-06-27 DIAGNOSIS — N2581 Secondary hyperparathyroidism of renal origin: Secondary | ICD-10-CM | POA: Diagnosis not present

## 2016-06-27 DIAGNOSIS — N186 End stage renal disease: Secondary | ICD-10-CM | POA: Diagnosis not present

## 2016-06-29 DIAGNOSIS — N186 End stage renal disease: Secondary | ICD-10-CM | POA: Diagnosis not present

## 2016-06-29 DIAGNOSIS — N2581 Secondary hyperparathyroidism of renal origin: Secondary | ICD-10-CM | POA: Diagnosis not present

## 2016-07-02 DIAGNOSIS — N2581 Secondary hyperparathyroidism of renal origin: Secondary | ICD-10-CM | POA: Diagnosis not present

## 2016-07-02 DIAGNOSIS — N186 End stage renal disease: Secondary | ICD-10-CM | POA: Diagnosis not present

## 2016-07-03 ENCOUNTER — Encounter: Payer: Self-pay | Admitting: Internal Medicine

## 2016-07-03 ENCOUNTER — Telehealth: Payer: Self-pay | Admitting: Internal Medicine

## 2016-07-03 ENCOUNTER — Ambulatory Visit (INDEPENDENT_AMBULATORY_CARE_PROVIDER_SITE_OTHER): Payer: Medicare Other | Admitting: Internal Medicine

## 2016-07-03 VITALS — BP 150/80 | HR 70 | Temp 98.6°F | Ht 67.0 in | Wt 227.0 lb

## 2016-07-03 DIAGNOSIS — D696 Thrombocytopenia, unspecified: Secondary | ICD-10-CM

## 2016-07-03 DIAGNOSIS — R5383 Other fatigue: Secondary | ICD-10-CM | POA: Diagnosis not present

## 2016-07-03 DIAGNOSIS — G4710 Hypersomnia, unspecified: Secondary | ICD-10-CM

## 2016-07-03 DIAGNOSIS — I10 Essential (primary) hypertension: Secondary | ICD-10-CM | POA: Diagnosis not present

## 2016-07-03 DIAGNOSIS — R413 Other amnesia: Secondary | ICD-10-CM | POA: Diagnosis not present

## 2016-07-03 DIAGNOSIS — E66812 Obesity, class 2: Secondary | ICD-10-CM

## 2016-07-03 DIAGNOSIS — G4730 Sleep apnea, unspecified: Secondary | ICD-10-CM

## 2016-07-03 DIAGNOSIS — Z6835 Body mass index (BMI) 35.0-35.9, adult: Secondary | ICD-10-CM

## 2016-07-03 DIAGNOSIS — T85618S Breakdown (mechanical) of other specified internal prosthetic devices, implants and grafts, sequela: Secondary | ICD-10-CM

## 2016-07-03 DIAGNOSIS — N186 End stage renal disease: Secondary | ICD-10-CM | POA: Diagnosis not present

## 2016-07-03 DIAGNOSIS — R609 Edema, unspecified: Secondary | ICD-10-CM

## 2016-07-03 DIAGNOSIS — D631 Anemia in chronic kidney disease: Secondary | ICD-10-CM

## 2016-07-03 DIAGNOSIS — Z992 Dependence on renal dialysis: Secondary | ICD-10-CM

## 2016-07-03 DIAGNOSIS — E1121 Type 2 diabetes mellitus with diabetic nephropathy: Secondary | ICD-10-CM | POA: Diagnosis not present

## 2016-07-03 DIAGNOSIS — E785 Hyperlipidemia, unspecified: Secondary | ICD-10-CM | POA: Diagnosis not present

## 2016-07-03 DIAGNOSIS — T85618A Breakdown (mechanical) of other specified internal prosthetic devices, implants and grafts, initial encounter: Secondary | ICD-10-CM | POA: Insufficient documentation

## 2016-07-03 NOTE — Progress Notes (Signed)
Facility  [place]    Place of Service:   OFFICE    Allergies  Allergen Reactions  . Codeine Nausea And Vomiting and Nausea Only    Altered mental status    Chief Complaint  Patient presents with  . Medical Management of Chronic Issues    4 month medication management blood pressure, blood sugar, anemia, fatigue    HPI:  Last seen by me 02/15/16.   Seen in ED 06/19/16 for rash, headache, and chills. Had tick bites prior to the ER visit. He was started on doxycycline. RMSF titer was borderline at 1:64. WBC were normal.   Had follow up visit with Joelene Millin on 06/21/16. BP was elevated at 180/ 86 on that visit.  He is having recurrent clotting of the left vein access.  End stage renal disease ([place]) - remains on dialysis  Anemia in chronic kidney disease, on chronic dialysis ([place]) - stable  Type 2 diabetes mellitus with diabetic nephropathy, without long-term current use of insulin (HCC) - controlled by diet  Edema, unspecified type - trace to 1+ in both feet  Fatigue, unspecified type - chronic problem that is worse since he was treated for RMSF  Hyperlipidemia, unspecified hyperlipidemia type - sable  Essential hypertension - variable Runs high at times and then may abruptly fall. Post dialysis period is usually accompanied by low BP.  Hypersomnia with sleep apnea - needs another CPAP from [facility]. Last machine was provided in 2009. Pressure setting 15.  Memory loss - unchanged. Continues to have some episodes of confusion.  Class 2 severe obesity due to excess calories with serious comorbidity and body mass index (BMI) of 35.0 to 35.9 in adult [facility]) - needs to lose weight.  Thrombocytopenia (HCC) - stable at 129,000    Medications: Patient's Medications  New Prescriptions   No medications on file  Previous Medications   ACETAMINOPHEN (TYLENOL) 500 MG TABLET    Take 1 tablet (500 mg total) by mouth every 6 (six) hours as needed for moderate pain.   AMLODIPINE  (NORVASC) 10 MG TABLET    One twice daily to control BP   ASPIRIN EC 81 MG TABLET    Take 81 mg by mouth daily.   B COMPLEX-VITAMIN C-FOLIC ACID (NEPHRO-VITE) 0.8 MG TABS TABLET    TAKE 1 TABLET BY MOUTH EVERY DAY   BUDESONIDE-FORMOTEROL (SYMBICORT) 80-4.5 MCG/ACT INHALER    Inhale 2 puffs into the lungs 2 (two) times daily.   CALCITRIOL (ROCALTROL) 0.25 MCG CAPSULE    Take 1 capsule (0.25 mcg total) by mouth every Monday, Wednesday, and Friday with hemodialysis.   CHOLECALCIFEROL (VITAMIN D3) 5000 UNITS TABS    Take 1 tablet by mouth daily.   DIPHENHYDRAMINE (BENADRYL) 25 MG TABLET    Take 50 mg by mouth 2 (two) times daily as needed for allergies.    DOXYCYCLINE (VIBRAMYCIN) 100 MG CAPSULE    Take 1 capsule (100 mg total) by mouth 2 (two) times daily.   GUAIFENESIN-DEXTROMETHORPHAN (ROBITUSSIN DM) 100-10 MG/5ML SYRUP    Take 10 mLs by mouth every 4 (four) hours as needed for cough.   HYDRALAZINE (APRESOLINE) 25 MG TABLET    One twice daily to regulate BP   IPRATROPIUM-ALBUTEROL (DUONEB) 0.5-2.5 (3) MG/3ML SOLN    Take 3 mLs by nebulization 3 (three) times daily. ICD 10 code J44.9   METOCLOPRAMIDE (REGLAN) 10 MG TABLET    One before each meal to increase stomach emptying   METOPROLOL TARTRATE (LOPRESSOR) 50  MG TABLET    One twice daily to regulate BP   OMEPRAZOLE (PRILOSEC) 20 MG CAPSULE    TAKE 1 CAPSULE (20 MG TOTAL) BY MOUTH 2 (TWO) TIMES DAILY BEFORE A MEAL.   PRAVASTATIN (PRAVACHOL) 40 MG TABLET    Take 1 tablet (40 mg total) by mouth every evening.   PROMETHAZINE (PHENERGAN) 25 MG TABLET    TAKE 1 TABLET BY MOUTH EVERY 8 HOURS AS NEEDED FOR NAUSEA AND VOMITING.. CAN FILL 08/10   TRAMADOL (ULTRAM) 50 MG TABLET    One up to 4 times daly if needed for pain  Modified Medications   No medications on file  Discontinued Medications   No medications on file    Review of Systems  Constitutional: Positive for fatigue. Negative for fever.       Chronically fatigued and sleepy.  HENT: Positive  for ear discharge and hearing loss. Negative for ear pain and tinnitus.   Eyes: Negative.   Respiratory: Negative for shortness of breath.        Dyspnea on exertion has improved. Pneumonia. Hospitalized 04/06/14 and 06/23/2014. Rehospitalized 09/18/2014 for increasing dyspnea felt secondary to acute exacerbation of his COPD plus fluid overload. Re hospitalized 12/07/15 to 12/11/15 for bilateral HCAP.  Cardiovascular: Positive for palpitations. Negative for chest pain and leg swelling.  Gastrointestinal: Negative for abdominal distention and abdominal pain.       Cholecystectomy 10/21/2014.  Endocrine:       Diabetes is under better control.  Genitourinary: Negative.        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  Musculoskeletal:       Neck discomfort. Chronic right knee discomfort. Chronic back pains. Unsteady gait.  Skin:       Tissue fibrosis at the laparoscopic cholecystectomy incisions in the right abdomen and maple.  Neurological: Positive for dizziness.       Memory impairment is stable. Feet feel numb. Recurrent headaches  Hematological:       Anemia of CKD.  Psychiatric/Behavioral: Positive for confusion and decreased concentration.       Insomnia. Falls asleep in the day.    Vitals:   07/03/16 1543 07/03/16 1601  BP: (!) 180/90 (!) 150/80  Pulse: 73 70  Temp: 98.6 F (37 C)   TempSrc: Oral   SpO2: 92%   Weight: 227 lb (103 kg)   Height: '5\' 7"'$  (1.702 m)    Body mass index is 35.55 kg/m. Wt Readings from Last 3 Encounters:  07/03/16 227 lb (103 kg)  06/21/16 221 lb 9.6 oz (100.5 kg)  02/15/16 224 lb 12.8 oz (102 kg)      Physical Exam  Constitutional: He is oriented to person, place, and time.  Obese.  HENT:  Loss of hearing.  Eyes:  Corrective lenses  Neck: No JVD present. No tracheal deviation present. No thyromegaly present.  Cardiovascular: Exam reveals no gallop.   No murmur heard. AF. Fistula  in the left forearm.  Abdominal: Soft. Bowel sounds are normal. He exhibits no distension and no mass. There is no tenderness.  Tender in the RUQ. Frequent nausea.  Musculoskeletal: Normal range of motion. He exhibits tenderness. He exhibits no edema.  Scar left knee. Tender right knee  Lymphadenopathy:    He has no cervical adenopathy.  Neurological: He is alert and oriented to person, place, and time. No cranial nerve deficit. Coordination normal.  Positive Tinel sign bilaterally  Skin: No rash noted. No  erythema. No pallor.  Tissue fibrosis at cholecystectomy incisions in the right abdomen and umbilicus. Seborrheic nonhealing lesion of the left forearm. 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  Psychiatric:  Drowsy during the exam.    Labs reviewed: Lab Summary Latest Ref Rng & Units 06/19/2016 02/14/2016 01/24/2016  Hemoglobin 13.0 - 17.0 g/dL 11.2(L) 11.7(L) 11.2(L)  Hematocrit 39.0 - 52.0 % 34.5(L) 35.9(L) 33.5(L)  White count 4.0 - 10.5 K/uL 5.4 4.6 5.7  Platelet count 150 - 400 K/uL 129(L) 97(L) 138(L)  Sodium 135 - 145 mmol/L 138 140 136  Potassium 3.5 - 5.1 mmol/L 3.7 4.0 3.3(L)  Calcium 8.9 - 10.3 mg/dL 9.2 9.2 9.1  Phosphorus - (None) (None) (None)  Creatinine 0.61 - 1.24 mg/dL 11.71(H) 11.87(H) 8.99(H)  AST 15 - 41 U/L 17 16 (None)  Alk Phos 38 - 126 U/L 67 54 (None)  Bilirubin 0.3 - 1.2 mg/dL 0.7 0.5 (None)  Glucose 65 - 99 mg/dL 129(H) 119(H) 134(H)  Cholesterol - (None) (None) (None)  HDL cholesterol - (None) (None) (None)  Triglycerides - (None) (None) (None)  LDL Direct - (None) (None) (None)  LDL Calc - (None) (None) (None)  Total protein 6.5 - 8.1 g/dL 6.8 6.6 (None)  Albumin 3.5 - 5.0 g/dL 3.8 3.9 (None)  Some recent data might be hidden   Lab Results  Component Value Date   TSH 3.480 06/29/2015   TSH 2.466 *Test methodology is 3rd generation TSH* 12/17/2006   TSH 2.771 *Test methodology is 3rd generation TSH* 12/13/2006   Lab Results  Component Value  Date   BUN 37 (H) 06/19/2016   BUN 52 (H) 02/14/2016   BUN 22 (H) 01/24/2016   Lab Results  Component Value Date   HGBA1C 5.4 02/14/2016   HGBA1C 6.0 (H) 06/29/2015   HGBA1C 6.5 02/09/2015    Assessment/Plan  1. End stage renal disease ([place]) Continue dialysis  2. Anemia in chronic kidney disease, on chronic dialysis (HCC) stable  3. Type 2 diabetes mellitus with diabetic nephropathy, without long-term current use of insulin (HCC) Controlled by diet  4. Edema, unspecified type unchanged  5. Fatigue, unspecified type Chronic condition with possible worsening following RMSF  6. Hyperlipidemia, unspecified hyperlipidemia type controlled  7. Essential hypertension Modest elevation in SBP with episodes of low BP following dialysis.  8. Hypersomnia with sleep apnea Continue CPAP  9. Memory loss Unclear etiology. Wife is worried he may have Alzhiemere's, but I think vascular issues are more likely.  10. Class 2 severe obesity due to excess calories with serious comorbidity and body mass index (BMI) of 35.0 to 35.9 in adult [facility]) Discussed weight loss  11. Thrombocytopenia ([place]) improved  12. Shunt malfunction, sequela Patient reports multiple episodes of clot removal from his shunt. I wonder if he needs a stronger anticoagulant than ASA. Will defer decision to Vascular surgeon and Nephrology

## 2016-07-03 NOTE — Telephone Encounter (Signed)
I spoke with the pt's wife while in office today. Explained AWV and attempted to schedule.  Pt's wife states she will call me when she checks her schedule. VDM (DD)

## 2016-07-04 ENCOUNTER — Telehealth: Payer: Self-pay | Admitting: *Deleted

## 2016-07-04 DIAGNOSIS — N186 End stage renal disease: Secondary | ICD-10-CM | POA: Diagnosis not present

## 2016-07-04 DIAGNOSIS — N2581 Secondary hyperparathyroidism of renal origin: Secondary | ICD-10-CM | POA: Diagnosis not present

## 2016-07-04 NOTE — Telephone Encounter (Signed)
Received fax from [facility] [phone] Fax:[phone] for CPAP and Supplies.  Pressure setting: 15cmH2O cm/h2o ICD10 G47.33-Obstructive Sleep Apnea Placed in [name] folder to review and sign.

## 2016-07-05 DIAGNOSIS — N186 End stage renal disease: Secondary | ICD-10-CM | POA: Diagnosis not present

## 2016-07-05 DIAGNOSIS — Z992 Dependence on renal dialysis: Secondary | ICD-10-CM | POA: Diagnosis not present

## 2016-07-05 DIAGNOSIS — I871 Compression of vein: Secondary | ICD-10-CM | POA: Diagnosis not present

## 2016-07-06 DIAGNOSIS — N2581 Secondary hyperparathyroidism of renal origin: Secondary | ICD-10-CM | POA: Diagnosis not present

## 2016-07-06 DIAGNOSIS — N186 End stage renal disease: Secondary | ICD-10-CM | POA: Diagnosis not present

## 2016-07-09 DIAGNOSIS — N186 End stage renal disease: Secondary | ICD-10-CM | POA: Diagnosis not present

## 2016-07-09 DIAGNOSIS — N2581 Secondary hyperparathyroidism of renal origin: Secondary | ICD-10-CM | POA: Diagnosis not present

## 2016-07-11 DIAGNOSIS — N2581 Secondary hyperparathyroidism of renal origin: Secondary | ICD-10-CM | POA: Diagnosis not present

## 2016-07-11 DIAGNOSIS — N186 End stage renal disease: Secondary | ICD-10-CM | POA: Diagnosis not present

## 2016-07-13 DIAGNOSIS — N186 End stage renal disease: Secondary | ICD-10-CM | POA: Diagnosis not present

## 2016-07-13 DIAGNOSIS — N2581 Secondary hyperparathyroidism of renal origin: Secondary | ICD-10-CM | POA: Diagnosis not present

## 2016-07-16 DIAGNOSIS — N2581 Secondary hyperparathyroidism of renal origin: Secondary | ICD-10-CM | POA: Diagnosis not present

## 2016-07-16 DIAGNOSIS — N186 End stage renal disease: Secondary | ICD-10-CM | POA: Diagnosis not present

## 2016-07-18 DIAGNOSIS — N186 End stage renal disease: Secondary | ICD-10-CM | POA: Diagnosis not present

## 2016-07-18 DIAGNOSIS — N2581 Secondary hyperparathyroidism of renal origin: Secondary | ICD-10-CM | POA: Diagnosis not present

## 2016-07-20 DIAGNOSIS — N2581 Secondary hyperparathyroidism of renal origin: Secondary | ICD-10-CM | POA: Diagnosis not present

## 2016-07-20 DIAGNOSIS — N186 End stage renal disease: Secondary | ICD-10-CM | POA: Diagnosis not present

## 2016-07-21 DIAGNOSIS — Z992 Dependence on renal dialysis: Secondary | ICD-10-CM | POA: Diagnosis not present

## 2016-07-21 DIAGNOSIS — T8612 Kidney transplant failure: Secondary | ICD-10-CM | POA: Diagnosis not present

## 2016-07-21 DIAGNOSIS — N186 End stage renal disease: Secondary | ICD-10-CM | POA: Diagnosis not present

## 2016-07-23 DIAGNOSIS — D509 Iron deficiency anemia, unspecified: Secondary | ICD-10-CM | POA: Diagnosis not present

## 2016-07-23 DIAGNOSIS — N186 End stage renal disease: Secondary | ICD-10-CM | POA: Diagnosis not present

## 2016-07-23 DIAGNOSIS — N2581 Secondary hyperparathyroidism of renal origin: Secondary | ICD-10-CM | POA: Diagnosis not present

## 2016-07-23 DIAGNOSIS — D631 Anemia in chronic kidney disease: Secondary | ICD-10-CM | POA: Diagnosis not present

## 2016-07-25 DIAGNOSIS — D631 Anemia in chronic kidney disease: Secondary | ICD-10-CM | POA: Diagnosis not present

## 2016-07-25 DIAGNOSIS — N2581 Secondary hyperparathyroidism of renal origin: Secondary | ICD-10-CM | POA: Diagnosis not present

## 2016-07-25 DIAGNOSIS — N186 End stage renal disease: Secondary | ICD-10-CM | POA: Diagnosis not present

## 2016-07-25 DIAGNOSIS — D509 Iron deficiency anemia, unspecified: Secondary | ICD-10-CM | POA: Diagnosis not present

## 2016-07-27 DIAGNOSIS — N186 End stage renal disease: Secondary | ICD-10-CM | POA: Diagnosis not present

## 2016-07-27 DIAGNOSIS — D631 Anemia in chronic kidney disease: Secondary | ICD-10-CM | POA: Diagnosis not present

## 2016-07-27 DIAGNOSIS — D509 Iron deficiency anemia, unspecified: Secondary | ICD-10-CM | POA: Diagnosis not present

## 2016-07-27 DIAGNOSIS — N2581 Secondary hyperparathyroidism of renal origin: Secondary | ICD-10-CM | POA: Diagnosis not present

## 2016-07-30 DIAGNOSIS — D631 Anemia in chronic kidney disease: Secondary | ICD-10-CM | POA: Diagnosis not present

## 2016-07-30 DIAGNOSIS — N2581 Secondary hyperparathyroidism of renal origin: Secondary | ICD-10-CM | POA: Diagnosis not present

## 2016-07-30 DIAGNOSIS — N186 End stage renal disease: Secondary | ICD-10-CM | POA: Diagnosis not present

## 2016-07-30 DIAGNOSIS — D509 Iron deficiency anemia, unspecified: Secondary | ICD-10-CM | POA: Diagnosis not present

## 2016-08-01 DIAGNOSIS — D509 Iron deficiency anemia, unspecified: Secondary | ICD-10-CM | POA: Diagnosis not present

## 2016-08-01 DIAGNOSIS — D631 Anemia in chronic kidney disease: Secondary | ICD-10-CM | POA: Diagnosis not present

## 2016-08-01 DIAGNOSIS — N186 End stage renal disease: Secondary | ICD-10-CM | POA: Diagnosis not present

## 2016-08-01 DIAGNOSIS — N2581 Secondary hyperparathyroidism of renal origin: Secondary | ICD-10-CM | POA: Diagnosis not present

## 2016-08-03 DIAGNOSIS — D631 Anemia in chronic kidney disease: Secondary | ICD-10-CM | POA: Diagnosis not present

## 2016-08-03 DIAGNOSIS — N186 End stage renal disease: Secondary | ICD-10-CM | POA: Diagnosis not present

## 2016-08-03 DIAGNOSIS — N2581 Secondary hyperparathyroidism of renal origin: Secondary | ICD-10-CM | POA: Diagnosis not present

## 2016-08-03 DIAGNOSIS — D509 Iron deficiency anemia, unspecified: Secondary | ICD-10-CM | POA: Diagnosis not present

## 2016-08-06 DIAGNOSIS — D509 Iron deficiency anemia, unspecified: Secondary | ICD-10-CM | POA: Diagnosis not present

## 2016-08-06 DIAGNOSIS — N2581 Secondary hyperparathyroidism of renal origin: Secondary | ICD-10-CM | POA: Diagnosis not present

## 2016-08-06 DIAGNOSIS — N186 End stage renal disease: Secondary | ICD-10-CM | POA: Diagnosis not present

## 2016-08-06 DIAGNOSIS — D631 Anemia in chronic kidney disease: Secondary | ICD-10-CM | POA: Diagnosis not present

## 2016-08-08 DIAGNOSIS — N186 End stage renal disease: Secondary | ICD-10-CM | POA: Diagnosis not present

## 2016-08-08 DIAGNOSIS — D509 Iron deficiency anemia, unspecified: Secondary | ICD-10-CM | POA: Diagnosis not present

## 2016-08-08 DIAGNOSIS — D631 Anemia in chronic kidney disease: Secondary | ICD-10-CM | POA: Diagnosis not present

## 2016-08-08 DIAGNOSIS — N2581 Secondary hyperparathyroidism of renal origin: Secondary | ICD-10-CM | POA: Diagnosis not present

## 2016-08-09 ENCOUNTER — Other Ambulatory Visit: Payer: Self-pay | Admitting: Gastroenterology

## 2016-08-10 DIAGNOSIS — N2581 Secondary hyperparathyroidism of renal origin: Secondary | ICD-10-CM | POA: Diagnosis not present

## 2016-08-10 DIAGNOSIS — D631 Anemia in chronic kidney disease: Secondary | ICD-10-CM | POA: Diagnosis not present

## 2016-08-10 DIAGNOSIS — N186 End stage renal disease: Secondary | ICD-10-CM | POA: Diagnosis not present

## 2016-08-10 DIAGNOSIS — D509 Iron deficiency anemia, unspecified: Secondary | ICD-10-CM | POA: Diagnosis not present

## 2016-08-13 ENCOUNTER — Telehealth: Payer: Self-pay

## 2016-08-13 DIAGNOSIS — N186 End stage renal disease: Secondary | ICD-10-CM | POA: Diagnosis not present

## 2016-08-13 DIAGNOSIS — D631 Anemia in chronic kidney disease: Secondary | ICD-10-CM | POA: Diagnosis not present

## 2016-08-13 DIAGNOSIS — N2581 Secondary hyperparathyroidism of renal origin: Secondary | ICD-10-CM | POA: Diagnosis not present

## 2016-08-13 DIAGNOSIS — D509 Iron deficiency anemia, unspecified: Secondary | ICD-10-CM | POA: Diagnosis not present

## 2016-08-13 NOTE — Telephone Encounter (Signed)
Heather with [facility] called and left message stating CPAP supply order signed by Dr.Green on 07/04/16 is invalid, Dr.Green is not in there system anymore.  Nira Conn is requesting that another attending provider sign order, order signed by Dr.Reed and faxed back

## 2016-08-15 ENCOUNTER — Ambulatory Visit (AMBULATORY_SURGERY_CENTER): Payer: Self-pay

## 2016-08-15 VITALS — Ht 67.0 in | Wt 227.0 lb

## 2016-08-15 DIAGNOSIS — N2581 Secondary hyperparathyroidism of renal origin: Secondary | ICD-10-CM | POA: Diagnosis not present

## 2016-08-15 DIAGNOSIS — D631 Anemia in chronic kidney disease: Secondary | ICD-10-CM | POA: Diagnosis not present

## 2016-08-15 DIAGNOSIS — Z86010 Personal history of colonic polyps: Secondary | ICD-10-CM

## 2016-08-15 DIAGNOSIS — D509 Iron deficiency anemia, unspecified: Secondary | ICD-10-CM | POA: Diagnosis not present

## 2016-08-15 DIAGNOSIS — N186 End stage renal disease: Secondary | ICD-10-CM | POA: Diagnosis not present

## 2016-08-15 MED ORDER — NA SULFATE-K SULFATE-MG SULF 17.5-3.13-1.6 GM/177ML PO SOLN: 1.0000 | Freq: Once | ORAL | 0 refills | Status: AC

## 2016-08-15 NOTE — Progress Notes (Signed)
Denies allergies to eggs or soy products. Denies complication of anesthesia or sedation. Denies use of weight loss medication. Denies use of O2.   Emmi instructions declined.  

## 2016-08-17 DIAGNOSIS — N186 End stage renal disease: Secondary | ICD-10-CM | POA: Diagnosis not present

## 2016-08-17 DIAGNOSIS — D631 Anemia in chronic kidney disease: Secondary | ICD-10-CM | POA: Diagnosis not present

## 2016-08-17 DIAGNOSIS — D509 Iron deficiency anemia, unspecified: Secondary | ICD-10-CM | POA: Diagnosis not present

## 2016-08-17 DIAGNOSIS — N2581 Secondary hyperparathyroidism of renal origin: Secondary | ICD-10-CM | POA: Diagnosis not present

## 2016-08-20 DIAGNOSIS — N2581 Secondary hyperparathyroidism of renal origin: Secondary | ICD-10-CM | POA: Diagnosis not present

## 2016-08-20 DIAGNOSIS — D509 Iron deficiency anemia, unspecified: Secondary | ICD-10-CM | POA: Diagnosis not present

## 2016-08-20 DIAGNOSIS — D631 Anemia in chronic kidney disease: Secondary | ICD-10-CM | POA: Diagnosis not present

## 2016-08-20 DIAGNOSIS — N186 End stage renal disease: Secondary | ICD-10-CM | POA: Diagnosis not present

## 2016-08-21 DIAGNOSIS — Z992 Dependence on renal dialysis: Secondary | ICD-10-CM | POA: Diagnosis not present

## 2016-08-21 DIAGNOSIS — T8612 Kidney transplant failure: Secondary | ICD-10-CM | POA: Diagnosis not present

## 2016-08-21 DIAGNOSIS — N186 End stage renal disease: Secondary | ICD-10-CM | POA: Diagnosis not present

## 2016-08-22 DIAGNOSIS — D631 Anemia in chronic kidney disease: Secondary | ICD-10-CM | POA: Diagnosis not present

## 2016-08-22 DIAGNOSIS — N186 End stage renal disease: Secondary | ICD-10-CM | POA: Diagnosis not present

## 2016-08-22 DIAGNOSIS — D509 Iron deficiency anemia, unspecified: Secondary | ICD-10-CM | POA: Diagnosis not present

## 2016-08-22 DIAGNOSIS — N2581 Secondary hyperparathyroidism of renal origin: Secondary | ICD-10-CM | POA: Diagnosis not present

## 2016-08-24 DIAGNOSIS — D509 Iron deficiency anemia, unspecified: Secondary | ICD-10-CM | POA: Diagnosis not present

## 2016-08-24 DIAGNOSIS — N186 End stage renal disease: Secondary | ICD-10-CM | POA: Diagnosis not present

## 2016-08-24 DIAGNOSIS — N2581 Secondary hyperparathyroidism of renal origin: Secondary | ICD-10-CM | POA: Diagnosis not present

## 2016-08-24 DIAGNOSIS — D631 Anemia in chronic kidney disease: Secondary | ICD-10-CM | POA: Diagnosis not present

## 2016-08-27 DIAGNOSIS — D509 Iron deficiency anemia, unspecified: Secondary | ICD-10-CM | POA: Diagnosis not present

## 2016-08-27 DIAGNOSIS — N186 End stage renal disease: Secondary | ICD-10-CM | POA: Diagnosis not present

## 2016-08-27 DIAGNOSIS — D631 Anemia in chronic kidney disease: Secondary | ICD-10-CM | POA: Diagnosis not present

## 2016-08-27 DIAGNOSIS — N2581 Secondary hyperparathyroidism of renal origin: Secondary | ICD-10-CM | POA: Diagnosis not present

## 2016-08-29 ENCOUNTER — Encounter: Payer: Self-pay | Admitting: Gastroenterology

## 2016-08-29 ENCOUNTER — Ambulatory Visit (AMBULATORY_SURGERY_CENTER): Payer: Medicare Other | Admitting: Gastroenterology

## 2016-08-29 VITALS — BP 154/73 | HR 70 | Temp 99.1°F | Resp 32 | Ht 67.0 in | Wt 227.0 lb

## 2016-08-29 DIAGNOSIS — G4733 Obstructive sleep apnea (adult) (pediatric): Secondary | ICD-10-CM | POA: Diagnosis not present

## 2016-08-29 DIAGNOSIS — I10 Essential (primary) hypertension: Secondary | ICD-10-CM | POA: Diagnosis not present

## 2016-08-29 DIAGNOSIS — Z86010 Personal history of colonic polyps: Secondary | ICD-10-CM

## 2016-08-29 DIAGNOSIS — J45909 Unspecified asthma, uncomplicated: Secondary | ICD-10-CM | POA: Diagnosis not present

## 2016-08-29 DIAGNOSIS — D121 Benign neoplasm of appendix: Secondary | ICD-10-CM | POA: Diagnosis not present

## 2016-08-29 DIAGNOSIS — D125 Benign neoplasm of sigmoid colon: Secondary | ICD-10-CM

## 2016-08-29 DIAGNOSIS — Z886 Allergy status to analgesic agent status: Secondary | ICD-10-CM | POA: Diagnosis not present

## 2016-08-29 DIAGNOSIS — K219 Gastro-esophageal reflux disease without esophagitis: Secondary | ICD-10-CM | POA: Diagnosis not present

## 2016-08-29 DIAGNOSIS — K5730 Diverticulosis of large intestine without perforation or abscess without bleeding: Secondary | ICD-10-CM

## 2016-08-29 DIAGNOSIS — J449 Chronic obstructive pulmonary disease, unspecified: Secondary | ICD-10-CM | POA: Diagnosis not present

## 2016-08-29 MED ORDER — SODIUM CHLORIDE 0.9 % IV SOLN: 500.0000 mL | INTRAVENOUS | Status: DC

## 2016-08-29 NOTE — Progress Notes (Signed)
Called to room to assist during endoscopic procedure.  Patient ID and intended procedure confirmed with present staff. Received instructions for my participation in the procedure from the performing physician.  

## 2016-08-29 NOTE — Patient Instructions (Signed)
Handout given on polyps  YOU HAD AN ENDOSCOPIC PROCEDURE TODAY: Refer to the procedure report and other information in the discharge instructions given to you for any specific questions about what was found during the examination. If this information does not answer your questions, please call [place] office at [phone] to clarify.   YOU SHOULD EXPECT: Some feelings of bloating in the abdomen. Passage of more gas than usual. Walking can help get rid of the air that was put into your GI tract during the procedure and reduce the bloating. If you had a lower endoscopy (such as a colonoscopy or flexible sigmoidoscopy) you may notice spotting of blood in your stool or on the toilet paper. Some abdominal soreness may be present for a day or two, also.  DIET: Your first meal following the procedure should be a light meal and then it is ok to progress to your normal diet. A half-sandwich or bowl of soup is an example of a good first meal. Heavy or fried foods are harder to digest and may make you feel nauseous or bloated. Drink plenty of fluids but you should avoid alcoholic beverages for 24 hours. If you had a esophageal dilation, please see attached instructions for diet.    ACTIVITY: Your care partner should take you home directly after the procedure. You should plan to take it easy, moving slowly for the rest of the day. You can resume normal activity the day after the procedure however YOU SHOULD NOT DRIVE, use power tools, machinery or perform tasks that involve climbing or major physical exertion for 24 hours (because of the sedation medicines used during the test).   SYMPTOMS TO REPORT IMMEDIATELY: A gastroenterologist can be reached at any hour. Please call [phone]  for any of the following symptoms:  Following lower endoscopy (colonoscopy, flexible sigmoidoscopy) Excessive amounts of blood in the stool  Significant tenderness, worsening of abdominal pains  Swelling of the abdomen that is  new, acute  Fever of 100 or higher    FOLLOW UP:  If any biopsies were taken you will be contacted by phone or by letter within the next 1-3 weeks. Call [phone]  if you have not heard about the biopsies in 3 weeks.  Please also call with any specific questions about appointments or follow up tests.  

## 2016-08-29 NOTE — Progress Notes (Signed)
Report to PACU, RN, vss, BBS= Clear.  

## 2016-08-29 NOTE — Progress Notes (Signed)
Pt's states no medical or surgical changes since previsit or office visit. 

## 2016-08-29 NOTE — Op Note (Signed)
[facility] Patient Name: Kirk [name] Procedure Date: 08/29/2016 1:27 PM MRN: [id number] Endoscopist: Milus Banister , MD Age: 56 Referring MD:  Date of Birth: 1960-09-25 Gender: Male Account #: [id number] Procedure:                Colonoscopy Indications:              High risk colon cancer surveillance: Personal                            history of colonic polyps Personal history of                            tubular adenomas, colonoscopy May 2009.Recommended                            to have repeat colonoscopy at 3 year interval. A                            reminder letter was sent in 2012. Colonoscopy                            06/2011 Dr. Ardis Hughs, two subcentimeter polyps                            removed, were adenomatous. Recommended recall at 5                            year interval. Medicines:                Monitored Anesthesia Care Procedure:                Pre-Anesthesia Assessment:                           - Prior to the procedure, a History and Physical                            was performed, and patient medications and                            allergies were reviewed. The patient's tolerance of                            previous anesthesia was also reviewed. The risks                            and benefits of the procedure and the sedation                            options and risks were discussed with the patient.                            All questions were answered, and informed consent  was obtained. Prior Anticoagulants: The patient has                            taken no previous anticoagulant or antiplatelet                            agents. ASA Grade Assessment: II - A patient with                            mild systemic disease. After reviewing the risks                            and benefits, the patient was deemed in                            satisfactory condition to undergo the procedure.                     After obtaining informed consent, the colonoscope                            was passed under direct vision. Throughout the                            procedure, the patient's blood pressure, pulse, and                            oxygen saturations were monitored continuously. The                            Model CF-HQ190L [id number]) scope was introduced                            through the anus and advanced to the the cecum,                            identified by appendiceal orifice and ileocecal                            valve. The colonoscopy was performed without                            difficulty. The patient tolerated the procedure                            well. The quality of the bowel preparation was                            good. The ileocecal valve, appendiceal orifice, and                            rectum were photographed. Scope In: 1:31:05 PM Scope Out: 1:43:26 PM Scope Withdrawal Time: 0 hours 11 minutes 19 seconds  Total Procedure Duration: 0 hours 12 minutes 21 seconds  Findings:  A 10 mm polyp was found in the appendiceal orifice.                            The polyp was pedunculated. The polyp was removed                            with a hot snare. Resection and retrieval were                            complete.                           Two sessile polyps were found in the sigmoid colon.                            The polyps were 2 to 3 mm in size. These polyps                            were removed with a cold snare. Resection and                            retrieval were complete.                           The exam was otherwise without abnormality on                            direct and retroflexion views. Complications:            No immediate complications. Estimated blood loss:                            None. Estimated Blood Loss:     Estimated blood loss: none. Impression:               - One 10 mm polyp at  the appendiceal orifice,                            removed with a hot snare. Resected and retrieved.                           - Two 2 to 3 mm polyps in the sigmoid colon,                            removed with a cold snare. Resected and retrieved.                           - The examination was otherwise normal on direct                            and retroflexion views. Recommendation:           - Patient has a contact number available for  emergencies. The signs and symptoms of potential                            delayed complications were discussed with the                            patient. Return to normal activities tomorrow.                            Written discharge instructions were provided to the                            patient.                           - Resume previous diet.                           - Continue present medications.                           You will receive a letter within 2-3 weeks with the                            pathology results and my final recommendations.                           If the polyp(s) is proven to be 'pre-cancerous' on                            pathology, you will need repeat colonoscopy in 3-5                            years. Milus Banister, MD 08/29/2016 1:46:47 PM This report has been signed electronically.

## 2016-08-30 ENCOUNTER — Telehealth: Payer: Self-pay

## 2016-08-30 DIAGNOSIS — N2581 Secondary hyperparathyroidism of renal origin: Secondary | ICD-10-CM | POA: Diagnosis not present

## 2016-08-30 DIAGNOSIS — N186 End stage renal disease: Secondary | ICD-10-CM | POA: Diagnosis not present

## 2016-08-30 DIAGNOSIS — D631 Anemia in chronic kidney disease: Secondary | ICD-10-CM | POA: Diagnosis not present

## 2016-08-30 DIAGNOSIS — D509 Iron deficiency anemia, unspecified: Secondary | ICD-10-CM | POA: Diagnosis not present

## 2016-08-30 NOTE — Telephone Encounter (Signed)
  Follow up Call-  Call back number 08/29/2016  Post procedure Call Back phone  # [phone]  Permission to leave phone message Yes  Some recent data might be hidden     Patient questions:  Do you have a fever, pain , or abdominal swelling? No. Pain Score  0 *  Have you tolerated food without any problems? Yes.    Have you been able to return to your normal activities? Yes.    Do you have any questions about your discharge instructions: Diet   No. Medications  No. Follow up visit  No.  Do you have questions or concerns about your Care? No.  Actions: * If pain score is 4 or above: No action needed, pain <4. Pt wife verbalize pt is doing well and up and gone to dialysis.

## 2016-08-31 DIAGNOSIS — N186 End stage renal disease: Secondary | ICD-10-CM | POA: Diagnosis not present

## 2016-08-31 DIAGNOSIS — N2581 Secondary hyperparathyroidism of renal origin: Secondary | ICD-10-CM | POA: Diagnosis not present

## 2016-08-31 DIAGNOSIS — Z992 Dependence on renal dialysis: Secondary | ICD-10-CM | POA: Diagnosis not present

## 2016-08-31 DIAGNOSIS — D509 Iron deficiency anemia, unspecified: Secondary | ICD-10-CM | POA: Diagnosis not present

## 2016-08-31 DIAGNOSIS — D631 Anemia in chronic kidney disease: Secondary | ICD-10-CM | POA: Diagnosis not present

## 2016-08-31 DIAGNOSIS — I871 Compression of vein: Secondary | ICD-10-CM | POA: Diagnosis not present

## 2016-08-31 DIAGNOSIS — T82858A Stenosis of vascular prosthetic devices, implants and grafts, initial encounter: Secondary | ICD-10-CM | POA: Diagnosis not present

## 2016-09-03 DIAGNOSIS — N2581 Secondary hyperparathyroidism of renal origin: Secondary | ICD-10-CM | POA: Diagnosis not present

## 2016-09-03 DIAGNOSIS — N186 End stage renal disease: Secondary | ICD-10-CM | POA: Diagnosis not present

## 2016-09-03 DIAGNOSIS — D509 Iron deficiency anemia, unspecified: Secondary | ICD-10-CM | POA: Diagnosis not present

## 2016-09-03 DIAGNOSIS — D631 Anemia in chronic kidney disease: Secondary | ICD-10-CM | POA: Diagnosis not present

## 2016-09-04 ENCOUNTER — Encounter: Payer: Self-pay | Admitting: Gastroenterology

## 2016-09-05 DIAGNOSIS — N2581 Secondary hyperparathyroidism of renal origin: Secondary | ICD-10-CM | POA: Diagnosis not present

## 2016-09-05 DIAGNOSIS — D509 Iron deficiency anemia, unspecified: Secondary | ICD-10-CM | POA: Diagnosis not present

## 2016-09-05 DIAGNOSIS — N186 End stage renal disease: Secondary | ICD-10-CM | POA: Diagnosis not present

## 2016-09-05 DIAGNOSIS — D631 Anemia in chronic kidney disease: Secondary | ICD-10-CM | POA: Diagnosis not present

## 2016-09-07 ENCOUNTER — Telehealth: Payer: Self-pay | Admitting: *Deleted

## 2016-09-07 DIAGNOSIS — N2581 Secondary hyperparathyroidism of renal origin: Secondary | ICD-10-CM | POA: Diagnosis not present

## 2016-09-07 DIAGNOSIS — N186 End stage renal disease: Secondary | ICD-10-CM | POA: Diagnosis not present

## 2016-09-07 DIAGNOSIS — D631 Anemia in chronic kidney disease: Secondary | ICD-10-CM | POA: Diagnosis not present

## 2016-09-07 DIAGNOSIS — D509 Iron deficiency anemia, unspecified: Secondary | ICD-10-CM | POA: Diagnosis not present

## 2016-09-07 NOTE — Telephone Encounter (Signed)
Kirk [name] with [facility] called requesting face to face notes for patient's CPAP machine. Faxed last OV notes to Fax: [phone]

## 2016-09-08 ENCOUNTER — Other Ambulatory Visit: Payer: Self-pay | Admitting: Gastroenterology

## 2016-09-10 DIAGNOSIS — N2581 Secondary hyperparathyroidism of renal origin: Secondary | ICD-10-CM | POA: Diagnosis not present

## 2016-09-10 DIAGNOSIS — N186 End stage renal disease: Secondary | ICD-10-CM | POA: Diagnosis not present

## 2016-09-10 DIAGNOSIS — D509 Iron deficiency anemia, unspecified: Secondary | ICD-10-CM | POA: Diagnosis not present

## 2016-09-10 DIAGNOSIS — D631 Anemia in chronic kidney disease: Secondary | ICD-10-CM | POA: Diagnosis not present

## 2016-09-12 ENCOUNTER — Inpatient Hospital Stay (HOSPITAL_COMMUNITY)
Admission: EM | Admit: 2016-09-12 | Discharge: 2016-09-22 | DRG: 189 | Disposition: A | Discharge status: Home / Self Care | Payer: Medicare Other | Attending: Internal Medicine | Admitting: Internal Medicine

## 2016-09-12 ENCOUNTER — Encounter (HOSPITAL_COMMUNITY): Payer: Self-pay

## 2016-09-12 ENCOUNTER — Emergency Department (HOSPITAL_COMMUNITY): Payer: Medicare Other

## 2016-09-12 DIAGNOSIS — Z7982 Long term (current) use of aspirin: Secondary | ICD-10-CM

## 2016-09-12 DIAGNOSIS — N186 End stage renal disease: Secondary | ICD-10-CM

## 2016-09-12 DIAGNOSIS — Z9889 Other specified postprocedural states: Secondary | ICD-10-CM

## 2016-09-12 DIAGNOSIS — M109 Gout, unspecified: Secondary | ICD-10-CM | POA: Diagnosis present

## 2016-09-12 DIAGNOSIS — J811 Chronic pulmonary edema: Secondary | ICD-10-CM | POA: Diagnosis present

## 2016-09-12 DIAGNOSIS — Z992 Dependence on renal dialysis: Secondary | ICD-10-CM

## 2016-09-12 DIAGNOSIS — Z940 Kidney transplant status: Secondary | ICD-10-CM | POA: Diagnosis not present

## 2016-09-12 DIAGNOSIS — J9801 Acute bronchospasm: Secondary | ICD-10-CM

## 2016-09-12 DIAGNOSIS — F419 Anxiety disorder, unspecified: Secondary | ICD-10-CM | POA: Diagnosis present

## 2016-09-12 DIAGNOSIS — D509 Iron deficiency anemia, unspecified: Secondary | ICD-10-CM | POA: Diagnosis not present

## 2016-09-12 DIAGNOSIS — R0989 Other specified symptoms and signs involving the circulatory and respiratory systems: Secondary | ICD-10-CM | POA: Diagnosis present

## 2016-09-12 DIAGNOSIS — J9601 Acute respiratory failure with hypoxia: Secondary | ICD-10-CM

## 2016-09-12 DIAGNOSIS — D631 Anemia in chronic kidney disease: Secondary | ICD-10-CM | POA: Diagnosis not present

## 2016-09-12 DIAGNOSIS — K219 Gastro-esophageal reflux disease without esophagitis: Secondary | ICD-10-CM | POA: Diagnosis present

## 2016-09-12 DIAGNOSIS — F17290 Nicotine dependence, other tobacco product, uncomplicated: Secondary | ICD-10-CM | POA: Diagnosis present

## 2016-09-12 DIAGNOSIS — J810 Acute pulmonary edema: Secondary | ICD-10-CM | POA: Diagnosis not present

## 2016-09-12 DIAGNOSIS — I132 Hypertensive heart and chronic kidney disease with heart failure and with stage 5 chronic kidney disease, or end stage renal disease: Secondary | ICD-10-CM | POA: Diagnosis present

## 2016-09-12 DIAGNOSIS — Z885 Allergy status to narcotic agent status: Secondary | ICD-10-CM

## 2016-09-12 DIAGNOSIS — I120 Hypertensive chronic kidney disease with stage 5 chronic kidney disease or end stage renal disease: Secondary | ICD-10-CM | POA: Diagnosis not present

## 2016-09-12 DIAGNOSIS — G4733 Obstructive sleep apnea (adult) (pediatric): Secondary | ICD-10-CM

## 2016-09-12 DIAGNOSIS — I501 Left ventricular failure: Secondary | ICD-10-CM | POA: Diagnosis present

## 2016-09-12 DIAGNOSIS — R0902 Hypoxemia: Secondary | ICD-10-CM | POA: Diagnosis not present

## 2016-09-12 DIAGNOSIS — N2581 Secondary hyperparathyroidism of renal origin: Secondary | ICD-10-CM | POA: Diagnosis not present

## 2016-09-12 DIAGNOSIS — R0602 Shortness of breath: Secondary | ICD-10-CM

## 2016-09-12 DIAGNOSIS — Z79899 Other long term (current) drug therapy: Secondary | ICD-10-CM

## 2016-09-12 DIAGNOSIS — Z981 Arthrodesis status: Secondary | ICD-10-CM

## 2016-09-12 DIAGNOSIS — Z9989 Dependence on other enabling machines and devices: Secondary | ICD-10-CM

## 2016-09-12 DIAGNOSIS — J9690 Respiratory failure, unspecified, unspecified whether with hypoxia or hypercapnia: Secondary | ICD-10-CM

## 2016-09-12 DIAGNOSIS — Z9049 Acquired absence of other specified parts of digestive tract: Secondary | ICD-10-CM

## 2016-09-12 DIAGNOSIS — R61 Generalized hyperhidrosis: Secondary | ICD-10-CM | POA: Diagnosis present

## 2016-09-12 MED ORDER — ALBUTEROL SULFATE (2.5 MG/3ML) 0.083% IN NEBU: 2.5000 mg | INHALATION_SOLUTION | Freq: Once | RESPIRATORY_TRACT | Status: DC

## 2016-09-12 MED ORDER — IPRATROPIUM BROMIDE 0.02 % IN SOLN
0.5000 mg | Freq: Once | RESPIRATORY_TRACT | Status: AC
  Administered 2016-09-12: 0.5 mg via RESPIRATORY_TRACT
  Filled 2016-09-12: qty 2.5

## 2016-09-12 MED ORDER — IPRATROPIUM-ALBUTEROL 0.5-2.5 (3) MG/3ML IN SOLN: 3.0000 mL | Freq: Once | RESPIRATORY_TRACT | Status: DC

## 2016-09-12 MED ORDER — METHYLPREDNISOLONE SODIUM SUCC 125 MG IJ SOLR
125.0000 mg | Freq: Once | INTRAMUSCULAR | Status: AC
  Administered 2016-09-12: 125 mg via INTRAVENOUS
  Filled 2016-09-12: qty 2

## 2016-09-12 MED ORDER — ALBUTEROL (5 MG/ML) CONTINUOUS INHALATION SOLN
10.0000 mg/h | INHALATION_SOLUTION | Freq: Once | RESPIRATORY_TRACT | Status: AC
  Administered 2016-09-12: 10 mg/h via RESPIRATORY_TRACT
  Filled 2016-09-12: qty 20

## 2016-09-12 MED ORDER — NITROGLYCERIN 2 % TD OINT
1.0000 [in_us] | TOPICAL_OINTMENT | Freq: Once | TRANSDERMAL | Status: AC
  Administered 2016-09-12: 1 [in_us] via TOPICAL
  Filled 2016-09-12: qty 1

## 2016-09-12 NOTE — ED Triage Notes (Signed)
Sob since home from dialysis

## 2016-09-13 DIAGNOSIS — I10 Essential (primary) hypertension: Secondary | ICD-10-CM | POA: Diagnosis not present

## 2016-09-13 DIAGNOSIS — Z79899 Other long term (current) drug therapy: Secondary | ICD-10-CM | POA: Diagnosis not present

## 2016-09-13 DIAGNOSIS — Z9049 Acquired absence of other specified parts of digestive tract: Secondary | ICD-10-CM | POA: Diagnosis not present

## 2016-09-13 DIAGNOSIS — R0989 Other specified symptoms and signs involving the circulatory and respiratory systems: Secondary | ICD-10-CM | POA: Diagnosis present

## 2016-09-13 DIAGNOSIS — R0602 Shortness of breath: Secondary | ICD-10-CM | POA: Diagnosis not present

## 2016-09-13 DIAGNOSIS — M109 Gout, unspecified: Secondary | ICD-10-CM | POA: Diagnosis present

## 2016-09-13 DIAGNOSIS — J811 Chronic pulmonary edema: Secondary | ICD-10-CM | POA: Diagnosis present

## 2016-09-13 DIAGNOSIS — T8612 Kidney transplant failure: Secondary | ICD-10-CM | POA: Diagnosis not present

## 2016-09-13 DIAGNOSIS — J9600 Acute respiratory failure, unspecified whether with hypoxia or hypercapnia: Secondary | ICD-10-CM | POA: Diagnosis not present

## 2016-09-13 DIAGNOSIS — R0902 Hypoxemia: Secondary | ICD-10-CM | POA: Diagnosis not present

## 2016-09-13 DIAGNOSIS — Z981 Arthrodesis status: Secondary | ICD-10-CM | POA: Diagnosis not present

## 2016-09-13 DIAGNOSIS — R61 Generalized hyperhidrosis: Secondary | ICD-10-CM | POA: Diagnosis present

## 2016-09-13 DIAGNOSIS — J810 Acute pulmonary edema: Secondary | ICD-10-CM | POA: Diagnosis not present

## 2016-09-13 DIAGNOSIS — Z885 Allergy status to narcotic agent status: Secondary | ICD-10-CM | POA: Diagnosis not present

## 2016-09-13 DIAGNOSIS — F17290 Nicotine dependence, other tobacco product, uncomplicated: Secondary | ICD-10-CM | POA: Diagnosis present

## 2016-09-13 DIAGNOSIS — I132 Hypertensive heart and chronic kidney disease with heart failure and with stage 5 chronic kidney disease, or end stage renal disease: Secondary | ICD-10-CM | POA: Diagnosis present

## 2016-09-13 DIAGNOSIS — J9601 Acute respiratory failure with hypoxia: Secondary | ICD-10-CM | POA: Diagnosis not present

## 2016-09-13 DIAGNOSIS — K219 Gastro-esophageal reflux disease without esophagitis: Secondary | ICD-10-CM | POA: Diagnosis present

## 2016-09-13 DIAGNOSIS — Z7982 Long term (current) use of aspirin: Secondary | ICD-10-CM | POA: Diagnosis not present

## 2016-09-13 DIAGNOSIS — Z992 Dependence on renal dialysis: Secondary | ICD-10-CM

## 2016-09-13 DIAGNOSIS — D631 Anemia in chronic kidney disease: Secondary | ICD-10-CM | POA: Diagnosis present

## 2016-09-13 DIAGNOSIS — J9621 Acute and chronic respiratory failure with hypoxia: Secondary | ICD-10-CM | POA: Diagnosis not present

## 2016-09-13 DIAGNOSIS — J449 Chronic obstructive pulmonary disease, unspecified: Secondary | ICD-10-CM | POA: Diagnosis not present

## 2016-09-13 DIAGNOSIS — N186 End stage renal disease: Secondary | ICD-10-CM | POA: Diagnosis not present

## 2016-09-13 DIAGNOSIS — R05 Cough: Secondary | ICD-10-CM | POA: Diagnosis not present

## 2016-09-13 DIAGNOSIS — G4733 Obstructive sleep apnea (adult) (pediatric): Secondary | ICD-10-CM

## 2016-09-13 DIAGNOSIS — Z9889 Other specified postprocedural states: Secondary | ICD-10-CM | POA: Diagnosis not present

## 2016-09-13 DIAGNOSIS — Z9989 Dependence on other enabling machines and devices: Secondary | ICD-10-CM

## 2016-09-13 DIAGNOSIS — E8770 Fluid overload, unspecified: Secondary | ICD-10-CM | POA: Diagnosis not present

## 2016-09-13 DIAGNOSIS — Z940 Kidney transplant status: Secondary | ICD-10-CM | POA: Diagnosis not present

## 2016-09-13 DIAGNOSIS — F419 Anxiety disorder, unspecified: Secondary | ICD-10-CM | POA: Diagnosis present

## 2016-09-13 DIAGNOSIS — R0600 Dyspnea, unspecified: Secondary | ICD-10-CM | POA: Diagnosis not present

## 2016-09-13 DIAGNOSIS — N185 Chronic kidney disease, stage 5: Secondary | ICD-10-CM | POA: Diagnosis not present

## 2016-09-13 DIAGNOSIS — I501 Left ventricular failure: Secondary | ICD-10-CM | POA: Diagnosis present

## 2016-09-13 DIAGNOSIS — J9690 Respiratory failure, unspecified, unspecified whether with hypoxia or hypercapnia: Secondary | ICD-10-CM | POA: Diagnosis not present

## 2016-09-13 DIAGNOSIS — J9801 Acute bronchospasm: Secondary | ICD-10-CM | POA: Diagnosis not present

## 2016-09-13 LAB — BRAIN NATRIURETIC PEPTIDE: B Natriuretic Peptide: 517 pg/mL — ABNORMAL HIGH (ref 0.0–100.0)

## 2016-09-13 LAB — CBC
HEMATOCRIT: 31.1 % — AB (ref 39.0–52.0)
Hemoglobin: 10.2 g/dL — ABNORMAL LOW (ref 13.0–17.0)
MCH: 31.8 pg (ref 26.0–34.0)
MCHC: 32.8 g/dL (ref 30.0–36.0)
MCV: 96.9 fL (ref 78.0–100.0)
PLATELETS: 105 10*3/uL — AB (ref 150–400)
RBC: 3.21 MIL/uL — ABNORMAL LOW (ref 4.22–5.81)
RDW: 15.6 % — ABNORMAL HIGH (ref 11.5–15.5)
WBC: 9.8 10*3/uL (ref 4.0–10.5)

## 2016-09-13 LAB — CBC WITH DIFFERENTIAL/PLATELET
Basophils Absolute: 0 10*3/uL (ref 0.0–0.1)
Basophils Relative: 0 %
EOS ABS: 0.1 10*3/uL (ref 0.0–0.7)
EOS PCT: 1 %
HEMATOCRIT: 34.6 % — AB (ref 39.0–52.0)
HEMOGLOBIN: 11.2 g/dL — AB (ref 13.0–17.0)
LYMPHS ABS: 0.8 10*3/uL (ref 0.7–4.0)
Lymphocytes Relative: 8 %
MCH: 31.5 pg (ref 26.0–34.0)
MCHC: 32.4 g/dL (ref 30.0–36.0)
MCV: 97.5 fL (ref 78.0–100.0)
MONO ABS: 0.3 10*3/uL (ref 0.1–1.0)
Monocytes Relative: 3 %
Neutro Abs: 8.9 10*3/uL — ABNORMAL HIGH (ref 1.7–7.7)
Neutrophils Relative %: 89 %
Platelets: 117 10*3/uL — ABNORMAL LOW (ref 150–400)
RBC: 3.55 MIL/uL — AB (ref 4.22–5.81)
RDW: 15.6 % — ABNORMAL HIGH (ref 11.5–15.5)
WBC: 10 10*3/uL (ref 4.0–10.5)

## 2016-09-13 LAB — COMPREHENSIVE METABOLIC PANEL
ALK PHOS: 71 U/L (ref 38–126)
ALT: 18 U/L (ref 17–63)
ALT: 18 U/L (ref 17–63)
AST: 21 U/L (ref 15–41)
AST: 22 U/L (ref 15–41)
Albumin: 3.9 g/dL (ref 3.5–5.0)
Albumin: 4.2 g/dL (ref 3.5–5.0)
Alkaline Phosphatase: 76 U/L (ref 38–126)
Anion gap: 16 — ABNORMAL HIGH (ref 5–15)
Anion gap: 16 — ABNORMAL HIGH (ref 5–15)
BILIRUBIN TOTAL: 0.9 mg/dL (ref 0.3–1.2)
BILIRUBIN TOTAL: 1 mg/dL (ref 0.3–1.2)
BUN: 43 mg/dL — AB (ref 6–20)
BUN: 50 mg/dL — AB (ref 6–20)
CALCIUM: 8.6 mg/dL — AB (ref 8.9–10.3)
CHLORIDE: 97 mmol/L — AB (ref 101–111)
CHLORIDE: 98 mmol/L — AB (ref 101–111)
CO2: 24 mmol/L (ref 22–32)
CO2: 25 mmol/L (ref 22–32)
CREATININE: 8.57 mg/dL — AB (ref 0.61–1.24)
CREATININE: 9.23 mg/dL — AB (ref 0.61–1.24)
Calcium: 8.8 mg/dL — ABNORMAL LOW (ref 8.9–10.3)
GFR calc Af Amer: 7 mL/min — ABNORMAL LOW (ref >60)
GFR calc Af Amer: 7 mL/min — ABNORMAL LOW (ref >60)
GFR, EST NON AFRICAN AMERICAN: 6 mL/min — AB (ref >60)
GFR, EST NON AFRICAN AMERICAN: 6 mL/min — AB (ref >60)
GLUCOSE: 172 mg/dL — AB (ref 65–99)
Glucose, Bld: 184 mg/dL — ABNORMAL HIGH (ref 65–99)
Potassium: 3.4 mmol/L — ABNORMAL LOW (ref 3.5–5.1)
Potassium: 3.7 mmol/L (ref 3.5–5.1)
Sodium: 137 mmol/L (ref 135–145)
Sodium: 139 mmol/L (ref 135–145)
Total Protein: 7.4 g/dL (ref 6.5–8.1)
Total Protein: 8 g/dL (ref 6.5–8.1)

## 2016-09-13 LAB — BLOOD GAS, ARTERIAL
ACID-BASE EXCESS: 0.5 mmol/L (ref 0.0–2.0)
BICARBONATE: 24.7 mmol/L (ref 20.0–28.0)
DRAWN BY: 382351
Delivery systems: POSITIVE
Expiratory PAP: 6
FIO2: 60
INSPIRATORY PAP: 14
O2 SAT: 94.7 %
PATIENT TEMPERATURE: 37
PCO2 ART: 41.3 mmHg (ref 32.0–48.0)
PO2 ART: 80.3 mmHg — AB (ref 83.0–108.0)
pH, Arterial: 7.396 (ref 7.350–7.450)

## 2016-09-13 LAB — TROPONIN I
Troponin I: <0.03 ng/mL (ref <0.03)
Troponin I: <0.03 ng/mL (ref <0.03)
Troponin I: <0.03 ng/mL (ref <0.03)
Troponin I: <0.03 ng/mL (ref <0.03)

## 2016-09-13 LAB — MRSA PCR SCREENING: MRSA BY PCR: NEGATIVE

## 2016-09-13 MED ORDER — PRAVASTATIN SODIUM 40 MG PO TABS
40.0000 mg | ORAL_TABLET | Freq: Every evening | ORAL | Status: DC
  Administered 2016-09-13 – 2016-09-21 (×9): 40 mg via ORAL
  Filled 2016-09-13 (×9): qty 1

## 2016-09-13 MED ORDER — ONDANSETRON HCL 4 MG PO TABS: 4.0000 mg | ORAL_TABLET | Freq: Four times a day (QID) | ORAL | Status: DC | PRN

## 2016-09-13 MED ORDER — SODIUM CHLORIDE 0.9% FLUSH
3.0000 mL | Freq: Two times a day (BID) | INTRAVENOUS | Status: DC
  Administered 2016-09-13 – 2016-09-20 (×14): 3 mL via INTRAVENOUS

## 2016-09-13 MED ORDER — IPRATROPIUM-ALBUTEROL 0.5-2.5 (3) MG/3ML IN SOLN
3.0000 mL | Freq: Three times a day (TID) | RESPIRATORY_TRACT | Status: DC
  Administered 2016-09-13 – 2016-09-22 (×26): 3 mL via RESPIRATORY_TRACT
  Filled 2016-09-13 (×28): qty 3

## 2016-09-13 MED ORDER — ALTEPLASE 2 MG IJ SOLR
2.0000 mg | Freq: Once | INTRAMUSCULAR | Status: DC | PRN
  Filled 2016-09-13: qty 2

## 2016-09-13 MED ORDER — ASPIRIN EC 81 MG PO TBEC
81.0000 mg | DELAYED_RELEASE_TABLET | Freq: Every day | ORAL | Status: DC
  Administered 2016-09-13 – 2016-09-22 (×10): 81 mg via ORAL
  Filled 2016-09-13 (×10): qty 1

## 2016-09-13 MED ORDER — HEPARIN SODIUM (PORCINE) 1000 UNIT/ML DIALYSIS
1000.0000 [IU] | INTRAMUSCULAR | Status: DC | PRN
  Filled 2016-09-13: qty 1

## 2016-09-13 MED ORDER — SODIUM CHLORIDE 0.9 % IV SOLN: 250.0000 mL | INTRAVENOUS | Status: DC | PRN

## 2016-09-13 MED ORDER — CALCITRIOL 0.25 MCG PO CAPS
0.2500 ug | ORAL_CAPSULE | ORAL | Status: DC
  Administered 2016-09-17 – 2016-09-21 (×3): 0.25 ug via ORAL
  Filled 2016-09-13 (×2): qty 1

## 2016-09-13 MED ORDER — LIDOCAINE HCL (PF) 1 % IJ SOLN: 5.0000 mL | INTRAMUSCULAR | Status: DC | PRN

## 2016-09-13 MED ORDER — DIPHENHYDRAMINE HCL 25 MG PO CAPS
50.0000 mg | ORAL_CAPSULE | Freq: Two times a day (BID) | ORAL | Status: DC | PRN
  Filled 2016-09-13: qty 2

## 2016-09-13 MED ORDER — LIDOCAINE-PRILOCAINE 2.5-2.5 % EX CREA
1.0000 "application " | TOPICAL_CREAM | CUTANEOUS | Status: DC | PRN
  Filled 2016-09-13: qty 5

## 2016-09-13 MED ORDER — PENTAFLUOROPROP-TETRAFLUOROETH EX AERO
1.0000 "application " | INHALATION_SPRAY | CUTANEOUS | Status: DC | PRN
  Administered 2016-09-21: 1 via TOPICAL
  Filled 2016-09-13 (×2): qty 30

## 2016-09-13 MED ORDER — METOPROLOL TARTRATE 50 MG PO TABS
50.0000 mg | ORAL_TABLET | Freq: Two times a day (BID) | ORAL | Status: DC
  Administered 2016-09-13 – 2016-09-19 (×14): 50 mg via ORAL
  Filled 2016-09-13 (×16): qty 1

## 2016-09-13 MED ORDER — IPRATROPIUM BROMIDE 0.02 % IN SOLN
0.5000 mg | Freq: Once | RESPIRATORY_TRACT | Status: AC
  Administered 2016-09-13: 0.5 mg via RESPIRATORY_TRACT
  Filled 2016-09-13: qty 2.5

## 2016-09-13 MED ORDER — HYDRALAZINE HCL 25 MG PO TABS
50.0000 mg | ORAL_TABLET | Freq: Three times a day (TID) | ORAL | Status: DC
  Administered 2016-09-13 – 2016-09-22 (×25): 50 mg via ORAL
  Filled 2016-09-13 (×25): qty 2

## 2016-09-13 MED ORDER — LORAZEPAM 2 MG/ML IJ SOLN
1.0000 mg | Freq: Once | INTRAMUSCULAR | Status: AC
Start: 2016-09-13 — End: 2016-09-13
  Administered 2016-09-13: 1 mg via INTRAVENOUS
  Filled 2016-09-13: qty 1

## 2016-09-13 MED ORDER — MOMETASONE FURO-FORMOTEROL FUM 100-5 MCG/ACT IN AERO
2.0000 | INHALATION_SPRAY | Freq: Two times a day (BID) | RESPIRATORY_TRACT | Status: DC
  Administered 2016-09-13 – 2016-09-22 (×15): 2 via RESPIRATORY_TRACT
  Filled 2016-09-13: qty 8.8

## 2016-09-13 MED ORDER — AMLODIPINE BESYLATE 5 MG PO TABS
10.0000 mg | ORAL_TABLET | Freq: Every day | ORAL | Status: DC
  Administered 2016-09-13 – 2016-09-22 (×10): 10 mg via ORAL
  Filled 2016-09-13 (×10): qty 2

## 2016-09-13 MED ORDER — GUAIFENESIN-DM 100-10 MG/5ML PO SYRP
10.0000 mL | ORAL_SOLUTION | ORAL | Status: DC | PRN
  Administered 2016-09-17 – 2016-09-18 (×3): 10 mL via ORAL
  Filled 2016-09-13 (×3): qty 10

## 2016-09-13 MED ORDER — SODIUM CHLORIDE 0.9 % IV SOLN: 100.0000 mL | INTRAVENOUS | Status: DC | PRN

## 2016-09-13 MED ORDER — SODIUM CHLORIDE 0.9% FLUSH: 3.0000 mL | INTRAVENOUS | Status: DC | PRN

## 2016-09-13 MED ORDER — ALBUTEROL (5 MG/ML) CONTINUOUS INHALATION SOLN
10.0000 mg/h | INHALATION_SOLUTION | Freq: Once | RESPIRATORY_TRACT | Status: AC
  Administered 2016-09-13: 10 mg/h via RESPIRATORY_TRACT

## 2016-09-13 MED ORDER — FUROSEMIDE 10 MG/ML IJ SOLN
100.0000 mg | Freq: Once | INTRAMUSCULAR | Status: AC
  Administered 2016-09-13: 100 mg via INTRAVENOUS
  Filled 2016-09-13: qty 12

## 2016-09-13 MED ORDER — VITAMIN D 1000 UNITS PO TABS
5000.0000 [IU] | ORAL_TABLET | Freq: Every day | ORAL | Status: DC
  Administered 2016-09-13 – 2016-09-22 (×11): 5000 [IU] via ORAL
  Filled 2016-09-13 (×10): qty 5

## 2016-09-13 MED ORDER — HEPARIN SODIUM (PORCINE) 5000 UNIT/ML IJ SOLN
5000.0000 [IU] | Freq: Three times a day (TID) | INTRAMUSCULAR | Status: DC
  Administered 2016-09-13 – 2016-09-22 (×27): 5000 [IU] via SUBCUTANEOUS
  Filled 2016-09-13 (×27): qty 1

## 2016-09-13 MED ORDER — TRAMADOL HCL 50 MG PO TABS
50.0000 mg | ORAL_TABLET | Freq: Four times a day (QID) | ORAL | Status: DC | PRN
  Administered 2016-09-13 – 2016-09-16 (×4): 50 mg via ORAL
  Filled 2016-09-13 (×4): qty 1

## 2016-09-13 MED ORDER — PANTOPRAZOLE SODIUM 40 MG PO TBEC
40.0000 mg | DELAYED_RELEASE_TABLET | Freq: Every day | ORAL | Status: DC
  Administered 2016-09-13 – 2016-09-22 (×10): 40 mg via ORAL
  Filled 2016-09-13 (×10): qty 1

## 2016-09-13 MED ORDER — NITROGLYCERIN 2 % TD OINT
1.0000 [in_us] | TOPICAL_OINTMENT | Freq: Four times a day (QID) | TRANSDERMAL | Status: DC
  Administered 2016-09-13 – 2016-09-14 (×5): 1 [in_us] via TOPICAL
  Filled 2016-09-13 (×4): qty 1

## 2016-09-13 MED ORDER — ONDANSETRON HCL 4 MG/2ML IJ SOLN: 4.0000 mg | Freq: Four times a day (QID) | INTRAMUSCULAR | Status: DC | PRN

## 2016-09-13 NOTE — Progress Notes (Signed)
**Note De-Identified  Obfuscation** Patient removed from BIPAP and placed on 2 L [place] following dialysis.  Patient tolerating well at this time.   RRT to continue to monitor.

## 2016-09-13 NOTE — ED Provider Notes (Signed)
[facility] DEPT Provider Note   CSN: [id number] Arrival date & time: 09/12/16  2323  Time seen 23:31 PM   History   Chief Complaint Chief Complaint  Patient presents with  . Shortness of Breath   Level V caveat for respiratory distress  HPI Kirk [name] is a 56 y.o. male.  HPI  Patient presents from home with his wife and daughter. Patient had dialysis today and got home around 10 AM. He started having shortness of breath that got progressively worse during the evening. His wife states he did a nebulizer treatment earlier in the afternoon and he had mild wheezing however that has also gotten worse. He did a second nebulizer without improvement. He reports he's been sweating and coughing up some yellow sputum. He tried using his sleep apnea at home tonight without improvement. Patient gets dialysis on Monday, Wednesday (today), and Fridays. He states he had his usual time on the dialysis today. He is not on oxygen at home.  PCP Gayland Curry, DO   Past Medical History:  Diagnosis Date  . Acute edema of lung, unspecified   . Acute, but ill-defined, cerebrovascular disease   . Allergy   . Anemia   . 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)   . Anxiety   . Asthma   . Carpal tunnel syndrome   . Cellulitis and abscess of trunk   . Cholelithiasis 07/13/2014  . Chronic headaches   . Debility, unspecified   . Dermatophytosis of the body   . Dysrhythmia    history of  . Edema   . End stage renal disease on dialysis [facility])    "MWF; [facility] in [facility]" (10/21/2014)  . Essential hypertension, benign   . GERD (gastroesophageal reflux disease)   . Gout, unspecified   . HTN (hypertension)   . Hypertrophy of prostate without urinary obstruction and other lower urinary tract symptoms (LUTS)   . Hypotension, unspecified   . Impotence of organic origin   . Insomnia, unspecified   . Kidney replaced by transplant   . Localization-related (focal) (partial) epilepsy and  epileptic syndromes with complex partial seizures, without mention of intractable epilepsy   . Lumbago   . Memory loss   . OSA on CPAP   . Other and unspecified hyperlipidemia   . Other chronic nonalcoholic liver disease   . Other malaise and fatigue   . Other nonspecific abnormal serum enzyme levels   . Pain in joint, lower leg   . Pain in joint, upper arm   . Pneumonia "several times"  . Renal dialysis status(V45.11) 02/05/2010   restarted 01/02/13 ofter renal trransplant failure  . Secondary hyperparathyroidism (of renal origin)   . Shortness of breath   . Sleep apnea   . Tension headache   . Unspecified constipation   . Unspecified essential hypertension   . Unspecified hereditary and idiopathic peripheral neuropathy   . Unspecified vitamin D deficiency     Patient Active Problem List   Diagnosis Date Noted  . Shunt malfunction 07/03/2016  . Nausea without vomiting 12/14/2015  . End stage renal disease ([place])   . Palpitations 06/29/2015  . Need for hepatitis C screening test 06/29/2015  . Fatigue 06/29/2015  . Myalgia 05/17/2015  . 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 Obesity hypoventilation syndrome ([place]) 12/09/2014  . Extrinsic asthma 12/09/2014  . Narcotic drug use 12/09/2014  . Hypersomnia with sleep apnea 12/09/2014  . SCC (squamous cell carcinoma), arm 11/09/2014  . Mild persistent  chronic asthma without complication 97/98/9211  . ESRD on dialysis ([place]) 09/09/2014  . HLD (hyperlipidemia) 09/09/2014  . Dyspnea 06/23/2014  . Pancytopenia ([place]) 04/28/2014  . Thrombocytopenia ([place]) 04/10/2014  . Fungal dermatitis 03/17/2014  . History of surgical procedure 09/17/2013  . Headache(784.0) 04/28/2013  . Awaiting organ transplant 02/13/2013  . HTN (hypertension)   . DM (diabetes mellitus), type 2 with renal complications ([place])   . Tension headache   . Memory loss   . Anemia in chronic renal disease   . Edema   . Pain in joint, lower leg 05/08/2012  .  Thoracic or lumbosacral neuritis or radiculitis 03/27/2012  . Nerve root pain 03/27/2012  . History of kidney transplant 09/10/2011  . Hearing loss 08/16/2011  . Obesity 08/16/2011  . Difficulty hearing 08/16/2011  . OSA on CPAP 08/05/2011    Past Surgical History:  Procedure Laterality Date  . AV FISTULA PLACEMENT Left ?2010   "forearm; at [facility]"  . BACK SURGERY    . CARDIAC CATHETERIZATION  03/21/2011  . CHOLECYSTECTOMY N/A 10/21/2014   Procedure: LAPAROSCOPIC CHOLECYSTECTOMY WITH INTRAOPERATIVE CHOLANGIOGRAM;  Surgeon: Autumn Messing III, MD;  Location: [facility];  Service: General;  Laterality: N/A;  . COLONOSCOPY    . 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 KIDNEY TRANSPLANT  08/17/2011   [place]  10/21/2014   w/IOC  . LEFT HEART CATHETERIZATION WITH CORONARY ANGIOGRAM N/A 03/21/2011   Procedure: LEFT HEART CATHETERIZATION WITH CORONARY ANGIOGRAM;  Surgeon: [name], MD;  Location: [facility] C[facility] LAB;  Service: Cardiovascular;  Laterality: N/A;  . NEPHRECTOMY  08/2013   removed transplaned kidney  . POSTERIOR FUSION CERVICAL SPINE  06/25/2012   for spinal stenosis  . VASECTOMY  2010       Home Medications    Prior to Admission medications   Medication Sig Start Date End Date Taking? Authorizing Provider  acetaminophen (TYLENOL) 500 MG tablet Take 1 tablet (500 mg total) by mouth every 6 (six) hours as needed for moderate pain. 09/12/14   Samuella Cota, MD  amLODipine (NORVASC) 10 MG tablet One twice daily to control BP 06/21/16   Lauree Chandler, NP  aspirin EC 81 MG tablet Take 81 mg by mouth daily.    [provider]  b complex-vitamin c-folic acid (NEPHRO-VITE) 0.8 MG TABS tablet TAKE 1 TABLET BY MOUTH EVERY DAY 03/29/16   Estill Dooms, MD  budesonide-formoterol [facility]) 80-4.5 MCG/ACT inhaler Inhale 2 puffs into the lungs 2 (two) times daily. 09/21/14   Bonnielee Haff, MD  calcitRIOL  (ROCALTROL) 0.25 MCG capsule Take 1 capsule (0.25 mcg total) by mouth every Monday, Wednesday, and Friday with hemodialysis. 09/21/14   Bonnielee Haff, MD  Cholecalciferol (VITAMIN D3) 5000 UNITS TABS Take 1 tablet by mouth daily.    [provider]  dextromethorphan-guaiFENesin (MUCINEX DM) 30-600 MG 12hr tablet Take 1 tablet by mouth 2 (two) times daily.    [provider]  diphenhydrAMINE (BENADRYL) 25 MG tablet Take 50 mg by mouth 2 (two) times daily as needed for allergies.     [provider]  guaiFENesin-dextromethorphan (ROBITUSSIN DM) 100-10 MG/5ML syrup Take 10 mLs by mouth every 4 (four) hours as needed for cough.    [provider]  hydrALAZINE (APRESOLINE) 25 MG tablet One twice daily to regulate BP 06/21/16   Lauree Chandler, NP  ipratropium-albuterol (DUONEB) 0.5-2.5 (3) MG/3ML SOLN Take 3 mLs by nebulization  3 (three) times daily. ICD 10 code J44.9 12/11/15   Kathie Dike, MD  metoCLOPramide (REGLAN) 10 MG tablet One before each meal to increase stomach emptying 12/14/15   Estill Dooms, MD  metoprolol tartrate (LOPRESSOR) 50 MG tablet One twice daily to regulate BP 06/21/16   Lauree Chandler, NP  omeprazole (PRILOSEC) 20 MG capsule TAKE 1 CAPSULE (20 MG TOTAL) BY MOUTH 2 (TWO) TIMES DAILY BEFORE A MEAL. 09/10/16   Milus Banister, MD  pravastatin (PRAVACHOL) 40 MG tablet Take 1 tablet (40 mg total) by mouth every evening. 06/21/16   Lauree Chandler, NP  promethazine (PHENERGAN) 25 MG tablet TAKE 1 TABLET BY MOUTH EVERY 8 HOURS AS NEEDED FOR NAUSEA AND VOMITING.. CAN FILL 08/10 01/25/16   Estill Dooms, MD  traMADol Veatrice [place]bon) 50 MG tablet One up to 4 times daly if needed for pain 12/14/15   Estill Dooms, MD    Family History Family History  Problem Relation Age of Onset  . Adopted: Yes  . Colon cancer Neg Hx   . Esophageal cancer Neg Hx   . Rectal cancer Neg Hx   . Stomach cancer Neg Hx     Social History Social History    Substance Use Topics  . Smoking status: Current Every Day Smoker    Packs/day: 0.50    Years: 32.00    Types: E-cigarettes    Last attempt to quit: 08/23/2014  . Smokeless tobacco: Never Used  . Alcohol use No  lives at home Lives with spouse Smokes Uses CPAP at night   Allergies   Codeine   Review of Systems Review of Systems  Unable to perform ROS: Severe respiratory distress     Physical Exam Updated Vital Signs ED Triage Vitals  Enc Vitals Group     BP 09/12/16 2350 (!) 186/79     Pulse Rate 09/12/16 2350 79     Resp 09/12/16 2350 (!) 27     Temp --      Temp src --      SpO2 09/12/16 2348 95 %     Weight --      Height --      Head Circumference --      Peak Flow --      Pain Score --      Pain Loc --      Pain Edu? --      Excl. in [place]? --     Vital signs normal Except the kidneys, respiratory rate was 30 during my initial exam, hypoxia, initial pulse ox was 83-85% on nasal cannula oxygen at 5 L, hypertension   Physical Exam  Constitutional: He is oriented to person, place, and time. He appears well-developed and well-nourished.  Non-toxic appearance. He does not appear ill. He appears distressed.  HENT:  Head: Normocephalic and atraumatic.  Right Ear: External ear normal.  Left Ear: External ear normal.  Nose: Nose normal. No mucosal edema or rhinorrhea.  Mouth/Throat: Oropharynx is clear and moist and mucous membranes are normal. No dental abscesses or uvula swelling.  Eyes: Pupils are equal, round, and reactive to light. Conjunctivae and EOM are normal.  Neck: Normal range of motion and full passive range of motion without pain. Neck supple.  Cardiovascular: Normal rate, regular rhythm and normal heart sounds.  Exam reveals no gallop and no friction rub.   No murmur heard. Pulmonary/Chest: Accessory muscle usage present. Tachypnea noted. He is in respiratory distress. He has decreased  breath sounds. He has wheezes. He has no rhonchi. He has no rales.  He exhibits no tenderness and no crepitus.  Pursed lip breathing  Abdominal: Soft. Normal appearance and bowel sounds are normal. He exhibits no distension. There is no tenderness. There is no rebound and no guarding.  Musculoskeletal: Normal range of motion. He exhibits no edema or tenderness.  Moves all extremities well.   Neurological: He is alert and oriented to person, place, and time. He has normal strength. No cranial nerve deficit.  Skin: Skin is warm, dry and intact. No rash noted. No erythema. No pallor.  Psychiatric: He has a normal mood and affect. His speech is normal and behavior is normal. His mood appears not anxious.  Nursing note and vitals reviewed.    ED Treatments / Results  Labs (all labs ordered are listed, but only abnormal results are displayed) Results for orders placed or performed during the hospital encounter of 09/12/16  Comprehensive metabolic panel  Result Value Ref Range   Sodium 139 135 - 145 mmol/L   Potassium 3.4 (L) 3.5 - 5.1 mmol/L   Chloride 98 (L) 101 - 111 mmol/L   CO2 25 22 - 32 mmol/L   Glucose, Bld 172 (H) 65 - 99 mg/dL   BUN 43 (H) 6 - 20 mg/dL   Creatinine, Ser 8.57 (H) 0.61 - 1.24 mg/dL   Calcium 8.8 (L) 8.9 - 10.3 mg/dL   Total Protein 8.0 6.5 - 8.1 g/dL   Albumin 4.2 3.5 - 5.0 g/dL   AST 21 15 - 41 U/L   ALT 18 17 - 63 U/L   Alkaline Phosphatase 76 38 - 126 U/L   Total Bilirubin 0.9 0.3 - 1.2 mg/dL   GFR calc non Af Amer 6 (L) >60 mL/min   GFR calc Af Amer 7 (L) >60 mL/min   Anion gap 16 (H) 5 - 15  CBC with Differential  Result Value Ref Range   WBC 10.0 4.0 - 10.5 K/uL   RBC 3.55 (L) 4.22 - 5.81 MIL/uL   Hemoglobin 11.2 (L) 13.0 - 17.0 g/dL   HCT 34.6 (L) 39.0 - 52.0 %   MCV 97.5 78.0 - 100.0 fL   MCH 31.5 26.0 - 34.0 pg   MCHC 32.4 30.0 - 36.0 g/dL   RDW 15.6 (H) 11.5 - 15.5 %   Platelets 117 (L) 150 - 400 K/uL   Neutrophils Relative % 89 %   Neutro Abs 8.9 (H) 1.7 - 7.7 K/uL   Lymphocytes Relative 8 %   Lymphs Abs  0.8 0.7 - 4.0 K/uL   Monocytes Relative 3 %   Monocytes Absolute 0.3 0.1 - 1.0 K/uL   Eosinophils Relative 1 %   Eosinophils Absolute 0.1 0.0 - 0.7 K/uL   Basophils Relative 0 %   Basophils Absolute 0.0 0.0 - 0.1 K/uL  Troponin I  Result Value Ref Range   Troponin I <0.03 <0.03 ng/mL  Brain natriuretic peptide  Result Value Ref Range   B Natriuretic Peptide 517.0 (H) 0.0 - 100.0 pg/mL  Blood gas, arterial  Result Value Ref Range   FIO2 60.00    Delivery systems BILEVEL POSITIVE AIRWAY PRESSURE    Inspiratory PAP 14    Expiratory PAP 6.0    pH, Arterial 7.396 7.350 - 7.450   pCO2 arterial 41.3 32.0 - 48.0 mmHg   pO2, Arterial 80.3 (L) 83.0 - 108.0 mmHg   Bicarbonate 24.7 20.0 - 28.0 mmol/L   Acid-Base Excess 0.5 0.0 -  2.0 mmol/L   O2 Saturation 94.7 %   Patient temperature 37.0    Collection site RIGHT RADIAL    Drawn by [phone]    Sample type ARTERIAL DRAW    Allens test (pass/fail) PASS PASS   Laboratory interpretation all normal except Chronic renal failure with hyperkalemia, mild anemia, mild elevation of his baseline BMP    EKG  EKG Interpretation  Date/Time:  Wednesday September 12 2016 23:35:29 EDT Ventricular Rate:  82 PR Interval:    QRS Duration: 94 QT Interval:  397 QTC Calculation: 464 R Axis:   69 Text Interpretation:  Sinus rhythm Probable lateral infarct, age indeterminate No significant change since last tracing 24 Jan 2016 Confirmed by Rolland Porter [phone]) on 09/12/2016 11:39:14 PM       Radiology Dg Chest Port 1 View  Result Date: 09/13/2016 CLINICAL DATA:  Shortness of breath since dialysis today. EXAM: PORTABLE CHEST 1 VIEW COMPARISON:  Frontal and lateral views 03/12/2016 FINDINGS: Mild cardiomegaly, new. Mediastinal contours are unchanged. Central pulmonary vascular engorgement. No confluent airspace disease. No pleural fluid or pneumothorax. Surgical hardware in the lower cervical spine is partially included. IMPRESSION: Cardiomegaly and vascular  congestion. Electronically Signed   By: Jeb Levering M.D.   On: 09/13/2016 00:05    Procedures Procedures (including critical care time)  CRITICAL CARE Performed by: Claudina Oliphant L Deniel Mcquiston Total critical care time: 43 minutes Critical care time was exclusive of separately billable procedures and treating other patients. Critical care was necessary to treat or prevent imminent or life-threatening deterioration. Critical care was time spent personally by me on the following activities: development of treatment plan with patient and/or surrogate as well as nursing, discussions with consultants, evaluation of patient's response to treatment, examination of patient, obtaining history from patient or surrogate, ordering and performing treatments and interventions, ordering and review of laboratory studies, ordering and review of radiographic studies, pulse oximetry and re-evaluation of patient's condition.   Medications Ordered in ED Medications  albuterol (PROVENTIL,VENTOLIN) solution continuous neb (10 mg/hr Nebulization Given 09/12/16 2348)  ipratropium (ATROVENT) nebulizer solution 0.5 mg (0.5 mg Nebulization Given 09/12/16 2348)  methylPREDNISolone sodium succinate (SOLU-MEDROL) 125 mg/2 mL injection 125 mg (125 mg Intravenous Given 09/12/16 2346)  nitroGLYCERIN (NITROGLYN) 2 % ointment 1 inch (1 inch Topical Given 09/12/16 2346)  albuterol (PROVENTIL,VENTOLIN) solution continuous neb (10 mg/hr Nebulization Given 09/13/16 0032)  ipratropium (ATROVENT) nebulizer solution 0.5 mg (0.5 mg Nebulization Given 09/13/16 0032)  furosemide (LASIX) injection 100 mg (100 mg Intravenous Given 09/13/16 0057)     Initial Impression / Assessment and Plan / ED Course  I have reviewed the triage vital signs and the nursing notes.  Pertinent labs & imaging results that were available during my care of the patient were reviewed by me and considered in my medical decision making (see chart for details).    When I walked  in the room and saw patient and I ordered BiPAP to be started. He was also started on a continuous nebulizer with albuterol and Atrovent. He was given IV Solu-Medrol for suspected COPD, patient smokes and uses nebulizers at home, and he had nitroglycerin paste applied to his skin for his hypertension. Patient did not have pitting edema of his legs and IV Lasix was not given initially.  At 2348 patient had just been started on BiPAP and was just starting his continuous nebulizer. His pulse ox improved to 96%.  2400 patients pulse ox is 97%, his respiratory rate is now 26, his blood  pressure is 186/79. He states he's starting to feel better and he appears calmer. He has improved air movement however he still has diffuse wheezing.  12:20 AM recheck, patient now laying almost flat, resting easier. On exam he has diffuse wheezing but less than before, R>L, improving air movement. His continuous nebulizer is done, repeat ordered. His wife states he makes some urine, I was going to order Bumex, but IV is on backorder, he was given IV lasix.  ABG ordered.   Recheck at 01:10 AM Patient is now laying flat in bed on his left side. When I examine his lungs his lung sounds are improved, he still has wheezing however they are getting lower pitched. His pulse ox is 99-100% on BiPAP. He is still getting the Second continuous nebulizer.  01:20 AM Dr Darrick Meigs, hospitalist, will admit   Final Clinical Impressions(s) / ED Diagnoses   Final diagnoses:  Hypoxia  Bronchospasm  ESRD on hemodialysis [facility])    Plan admission  Rolland Porter, MD, Barbette Or, MD 09/13/16 [phone]

## 2016-09-13 NOTE — Care Management Note (Signed)
Case Management Note  Patient Details  Name: Kirk [name] MRN: [id number] Date of Birth: 1961-01-12  Subjective/Objective:    Admitted with pulmonary edema. Pt is from home, lives with wife and daughter. Pt is ind with ADL's. He has PCP, drives himself to appointments, has insurance with drug coverage. He has CPAP and neb machine, no home oxygen. Pt gets HD at [facility] in [place] three days a week. Family at bedside.                 Action/Plan:  CM following for needs. Anticipate DC home with self care.    Expected Discharge Date:  09/17/16               Expected Discharge Plan:     In-House Referral:     Discharge planning Services  CM Consult  Post Acute Care Choice:    Choice offered to:     DME Arranged:    DME Agency:     HH Arranged:    HH Agency:     Status of Service:  In process, will continue to follow  If discussed at Long Length of Stay Meetings, dates discussed:    Additional Comments:  Kirk [name], Kirk Reading, RN 09/13/2016, 1:38 PM

## 2016-09-13 NOTE — Progress Notes (Signed)
Patient seen and examined, database reviewed. Admitted earlier today due to shortness of breath. Patient has end-stage renal disease. Chest x-ray showed pulmonary edema. He will have an emergent dialysis session today. He has been requiring BiPAP and did not tolerate a weaning trial prior to dialysis. Anticipate he should improve quickly with volume removal and anticipate discharge home in 24-48 hours.  Domingo Mend, MD [facility] Pager: [phone]

## 2016-09-13 NOTE — Care Management Obs Status (Signed)
Parchment NOTIFICATION   Patient Details  Name: Kirk [name] MRN: [id number] Date of Birth: [birth year]   Medicare Observation Status Notification Given:  Yes    Lyvonne Cassell, Chauncey Reading, RN 09/13/2016, 1:36 PM

## 2016-09-13 NOTE — ED Notes (Signed)
Pt took off bipap and cut the machine off. I cut the machine back on and put the mask back on him. When I went into room he was 84% on room air when I put the mask back on he went up to 94%. Respiratory was called.

## 2016-09-13 NOTE — Consult Note (Signed)
Reason for Consult: Fluid overload and end-stage disease Referring Physician: Dr. Zettie Pho is an 56 y.o. male.  HPI: He the patient was history of hypertension, asthma, sleep apnea and end-stage renal disease on maintenance hemodialysis presently came with complaints of difficulty breathing, cough and wheezing since last night. Patient has started dialysis yesterday and went home without any difficulty. After finally started having wheezing. He starts using the inhaler without significant response and was admitted to the hospital. When he was evaluated and still patient had some fluid overload and hence consult is called. Presently he is on BiPAP.  Past Medical History:  Diagnosis Date  . Acute edema of lung, unspecified   . Acute, but ill-defined, cerebrovascular disease   . Allergy   . Anemia   . 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)   . Anxiety   . Asthma   . Carpal tunnel syndrome   . Cellulitis and abscess of trunk   . Cholelithiasis 07/13/2014  . Chronic headaches   . Debility, unspecified   . Dermatophytosis of the body   . Dysrhythmia    history of  . Edema   . End stage renal disease on dialysis [facility])    "MWF; [facility] in [facility]" (10/21/2014)  . Essential hypertension, benign   . GERD (gastroesophageal reflux disease)   . Gout, unspecified   . HTN (hypertension)   . Hypertrophy of prostate without urinary obstruction and other lower urinary tract symptoms (LUTS)   . Hypotension, unspecified   . Impotence of organic origin   . Insomnia, unspecified   . Kidney replaced by transplant   . Localization-related (focal) (partial) epilepsy and epileptic syndromes with complex partial seizures, without mention of intractable epilepsy   . Lumbago   . Memory loss   . OSA on CPAP   . Other and unspecified hyperlipidemia   . Other chronic nonalcoholic liver disease   . Other malaise and fatigue   . Other nonspecific abnormal serum enzyme levels    . Pain in joint, lower leg   . Pain in joint, upper arm   . Pneumonia "several times"  . Renal dialysis status(V45.11) 02/05/2010   restarted 01/02/13 ofter renal trransplant failure  . Secondary hyperparathyroidism (of renal origin)   . Shortness of breath   . Sleep apnea   . Tension headache   . Unspecified constipation   . Unspecified essential hypertension   . Unspecified hereditary and idiopathic peripheral neuropathy   . Unspecified vitamin D deficiency     Past Surgical History:  Procedure Laterality Date  . AV FISTULA PLACEMENT Left ?2010   "forearm; at [facility]"  . BACK SURGERY    . CARDIAC CATHETERIZATION  03/21/2011  . CHOLECYSTECTOMY N/A 10/21/2014   Procedure: LAPAROSCOPIC CHOLECYSTECTOMY WITH INTRAOPERATIVE CHOLANGIOGRAM;  Surgeon: Autumn Messing III, MD;  Location: [place];  Service: General;  Laterality: N/A;  . COLONOSCOPY    . 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 KIDNEY TRANSPLANT  08/17/2011   [place]  10/21/2014   w/IOC  . LEFT HEART CATHETERIZATION WITH CORONARY ANGIOGRAM N/A 03/21/2011   Procedure: LEFT HEART CATHETERIZATION WITH CORONARY ANGIOGRAM;  Surgeon: [name], MD;  Location: [facility] CATH LAB;  Service: Cardiovascular;  Laterality: N/A;  . NEPHRECTOMY  08/2013   removed transplaned kidney  . POSTERIOR FUSION CERVICAL SPINE  06/25/2012   for spinal stenosis  . VASECTOMY  2010    Family  History  Problem Relation Age of Onset  . Adopted: Yes  . Colon cancer Neg Hx   . Esophageal cancer Neg Hx   . Rectal cancer Neg Hx   . Stomach cancer Neg Hx     Social History:  reports that he has been smoking E-cigarettes.  He has a 16.00 pack-year smoking history. He has never used smokeless tobacco. He reports that he does not drink alcohol or use drugs.  Allergies:  Allergies  Allergen Reactions  . Codeine Nausea And Vomiting and Nausea Only    Altered mental status    Medications: I  have reviewed the patient's current medications.  Results for orders placed or performed during the hospital encounter of 09/12/16 (from the past 48 hour(s))  Comprehensive metabolic panel     Status: Abnormal   Collection Time: 09/12/16 11:38 PM  Result Value Ref Range   Sodium 139 135 - 145 mmol/L   Potassium 3.4 (L) 3.5 - 5.1 mmol/L   Chloride 98 (L) 101 - 111 mmol/L   CO2 25 22 - 32 mmol/L   Glucose, Bld 172 (H) 65 - 99 mg/dL   BUN 43 (H) 6 - 20 mg/dL   Creatinine, Ser 8.57 (H) 0.61 - 1.24 mg/dL   Calcium 8.8 (L) 8.9 - 10.3 mg/dL   Total Protein 8.0 6.5 - 8.1 g/dL   Albumin 4.2 3.5 - 5.0 g/dL   AST 21 15 - 41 U/L   ALT 18 17 - 63 U/L   Alkaline Phosphatase 76 38 - 126 U/L   Total Bilirubin 0.9 0.3 - 1.2 mg/dL   GFR calc non Af Amer 6 (L) >60 mL/min   GFR calc Af Amer 7 (L) >60 mL/min    Comment: (NOTE) The eGFR has been calculated using the CKD EPI equation. This calculation has not been validated in all clinical situations. eGFR's persistently <60 mL/min signify possible Chronic Kidney Disease.    Anion gap 16 (H) 5 - 15  CBC with Differential     Status: Abnormal   Collection Time: 09/12/16 11:38 PM  Result Value Ref Range   WBC 10.0 4.0 - 10.5 K/uL   RBC 3.55 (L) 4.22 - 5.81 MIL/uL   Hemoglobin 11.2 (L) 13.0 - 17.0 g/dL   HCT 34.6 (L) 39.0 - 52.0 %   MCV 97.5 78.0 - 100.0 fL   MCH 31.5 26.0 - 34.0 pg   MCHC 32.4 30.0 - 36.0 g/dL   RDW 15.6 (H) 11.5 - 15.5 %   Platelets 117 (L) 150 - 400 K/uL    Comment: SPECIMEN CHECKED FOR CLOTS PLATELET COUNT CONFIRMED BY SMEAR    Neutrophils Relative % 89 %   Neutro Abs 8.9 (H) 1.7 - 7.7 K/uL   Lymphocytes Relative 8 %   Lymphs Abs 0.8 0.7 - 4.0 K/uL   Monocytes Relative 3 %   Monocytes Absolute 0.3 0.1 - 1.0 K/uL   Eosinophils Relative 1 %   Eosinophils Absolute 0.1 0.0 - 0.7 K/uL   Basophils Relative 0 %   Basophils Absolute 0.0 0.0 - 0.1 K/uL  Troponin I     Status: None   Collection Time: 09/12/16 11:38 PM  Result  Value Ref Range   Troponin I <0.03 <0.03 ng/mL  Brain natriuretic peptide     Status: Abnormal   Collection Time: 09/12/16 11:38 PM  Result Value Ref Range   B Natriuretic Peptide 517.0 (H) 0.0 - 100.0 pg/mL  Blood gas, arterial     Status:  Abnormal   Collection Time: 09/13/16 12:35 AM  Result Value Ref Range   FIO2 60.00    Delivery systems BILEVEL POSITIVE AIRWAY PRESSURE    Inspiratory PAP 14    Expiratory PAP 6.0    pH, Arterial 7.396 7.350 - 7.450   pCO2 arterial 41.3 32.0 - 48.0 mmHg   pO2, Arterial 80.3 (L) 83.0 - 108.0 mmHg   Bicarbonate 24.7 20.0 - 28.0 mmol/L   Acid-Base Excess 0.5 0.0 - 2.0 mmol/L   O2 Saturation 94.7 %   Patient temperature 37.0    Collection site RIGHT RADIAL    Drawn by [id number]    Sample type ARTERIAL DRAW    Allens test (pass/fail) PASS PASS  MRSA PCR Screening     Status: None   Collection Time: 09/13/16  4:07 AM  Result Value Ref Range   MRSA by PCR NEGATIVE NEGATIVE    Comment:        The GeneXpert MRSA Assay (FDA approved for NASAL specimens only), is one component of a comprehensive MRSA colonization surveillance program. It is not intended to diagnose MRSA infection nor to guide or monitor treatment for MRSA infections.   Troponin I (q 6hr x 3)     Status: None   Collection Time: 09/13/16  4:16 AM  Result Value Ref Range   Troponin I <0.03 <0.03 ng/mL  CBC     Status: Abnormal   Collection Time: 09/13/16  4:16 AM  Result Value Ref Range   WBC 9.8 4.0 - 10.5 K/uL   RBC 3.21 (L) 4.22 - 5.81 MIL/uL   Hemoglobin 10.2 (L) 13.0 - 17.0 g/dL   HCT 79.9 (L) 61.5 - 43.2 %   MCV 96.9 78.0 - 100.0 fL   MCH 31.8 26.0 - 34.0 pg   MCHC 32.8 30.0 - 36.0 g/dL   RDW 08.0 (H) 70.5 - 63.2 %   Platelets 105 (L) 150 - 400 K/uL    Comment: SPECIMEN CHECKED FOR CLOTS CONSISTENT WITH PREVIOUS RESULT   Comprehensive metabolic panel     Status: Abnormal   Collection Time: 09/13/16  4:16 AM  Result Value Ref Range   Sodium 137 135 - 145 mmol/L    Potassium 3.7 3.5 - 5.1 mmol/L   Chloride 97 (L) 101 - 111 mmol/L   CO2 24 22 - 32 mmol/L   Glucose, Bld 184 (H) 65 - 99 mg/dL   BUN 50 (H) 6 - 20 mg/dL   Creatinine, Ser 1.18 (H) 0.61 - 1.24 mg/dL   Calcium 8.6 (L) 8.9 - 10.3 mg/dL   Total Protein 7.4 6.5 - 8.1 g/dL   Albumin 3.9 3.5 - 5.0 g/dL   AST 22 15 - 41 U/L   ALT 18 17 - 63 U/L   Alkaline Phosphatase 71 38 - 126 U/L   Total Bilirubin 1.0 0.3 - 1.2 mg/dL   GFR calc non Af Amer 6 (L) >60 mL/min   GFR calc Af Amer 7 (L) >60 mL/min    Comment: (NOTE) The eGFR has been calculated using the CKD EPI equation. This calculation has not been validated in all clinical situations. eGFR's persistently <60 mL/min signify possible Chronic Kidney Disease.    Anion gap 16 (H) 5 - 15    Dg Chest Port 1 View  Result Date: 09/13/2016 CLINICAL DATA:  Shortness of breath since dialysis today. EXAM: PORTABLE CHEST 1 VIEW COMPARISON:  Frontal and lateral views 03/12/2016 FINDINGS: Mild cardiomegaly, new. Mediastinal contours are unchanged. Central pulmonary vascular  engorgement. No confluent airspace disease. No pleural fluid or pneumothorax. Surgical hardware in the lower cervical spine is partially included. IMPRESSION: Cardiomegaly and vascular congestion. Electronically Signed   By: Jeb Levering M.D.   On: 09/13/2016 00:05    Review of Systems  Constitutional: Positive for chills and fever.  HENT: Positive for congestion.   Respiratory: Positive for cough, sputum production, shortness of breath and wheezing.   Cardiovascular: Positive for orthopnea. Negative for chest pain.  Gastrointestinal: Negative for nausea and vomiting.   Blood pressure (!) 208/94, pulse 92, temperature 97.9 F (36.6 C), resp. rate (!) 27, SpO2 95 %. Physical Exam  Constitutional: He is oriented to person, place, and time. No distress.  Neck: JVD present.  Cardiovascular: Normal rate and regular rhythm.   No murmur heard. Respiratory: No respiratory  distress. He has wheezes. He has rales.  GI: He exhibits no distension. There is no tenderness.  Musculoskeletal: He exhibits edema.  Neurological: He is alert and oriented to person, place, and time.    Assessment/Plan: Problem #1 difficulty breathing: Most likely a combination of asthma and also fluid overload. According to the patient for able to remove 3 L but because of leg cramp he didn't come down to his dry weight. Problem #2 end-stage renal disease: Asymptomatic number patient was dialyzed yesterday. He doesn't have any nausea or vomiting. Problem #3 hypertension: His blood pressure is slightly high Problem #4 history of sleep apnea: He is on BiPAP Problem #5 history of anxiety disorder   problem #r 6 anemia: His hemoglobin is above our target goal Problem #7 bone mineral disorder: His calcium is range. Plan: 1]We'll make arrangements for patient to get dialysis him fo 31/2 today and possibly try to remove about 2-1/2 L if blood pressure tolerates 2] we may attempt to dialyze him tomorrow also and try to bring him down to his dry weight. 3] patient advised to cut down his salt and fluid intake. 4] we'll check his renal panel in the morning 5] we'll hold heparin as patient had recurrent bleeding from his fistula.  Lenord Fralix S 09/13/2016, 7:49 AM

## 2016-09-13 NOTE — Consult Note (Signed)
Consult requested by: [facility], Dr. Jerilee Hoh Consult requested for: respiratory failure  HPI: This is a 56 year old with a long known history of hypertension, end-stage renal disease on hemodialysis, obstructive sleep apnea on CPAP and to came to the emergency department early this morning with shortness of breath and chest pain. His shortness of breath did not get better and he came to the emergency department. He was found to have pulmonary vascular congestion when he came to the emergency department. He was placed on BiPAP and BiPAP was attempted to be removed this morning but he did not tolerate that. He is undergoing dialysis now. History is from the medical record and family in the room because he is really not able to provide any history now with being on BiPAP and sleepy.  Past Medical History:  Diagnosis Date  . Acute edema of lung, unspecified   . Acute, but ill-defined, cerebrovascular disease   . Allergy   . Anemia   . 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)   . Anxiety   . Asthma   . Carpal tunnel syndrome   . Cellulitis and abscess of trunk   . Cholelithiasis 07/13/2014  . Chronic headaches   . Debility, unspecified   . Dermatophytosis of the body   . Dysrhythmia    history of  . Edema   . End stage renal disease on dialysis [facility])    "MWF; [facility]" (10/21/2014)  . Essential hypertension, benign   . GERD (gastroesophageal reflux disease)   . Gout, unspecified   . HTN (hypertension)   . Hypertrophy of prostate without urinary obstruction and other lower urinary tract symptoms (LUTS)   . Hypotension, unspecified   . Impotence of organic origin   . Insomnia, unspecified   . Kidney replaced by transplant   . Localization-related (focal) (partial) epilepsy and epileptic syndromes with complex partial seizures, without mention of intractable epilepsy   . Lumbago   . Memory loss   . OSA on CPAP   . Other and unspecified hyperlipidemia    . Other chronic nonalcoholic liver disease   . Other malaise and fatigue   . Other nonspecific abnormal serum enzyme levels   . Pain in joint, lower leg   . Pain in joint, upper arm   . Pneumonia "several times"  . Renal dialysis status(V45.11) 02/05/2010   restarted 01/02/13 ofter renal trransplant failure  . Secondary hyperparathyroidism (of renal origin)   . Shortness of breath   . Sleep apnea   . Tension headache   . Unspecified constipation   . Unspecified essential hypertension   . Unspecified hereditary and idiopathic peripheral neuropathy   . Unspecified vitamin D deficiency      Family History  Problem Relation Age of Onset  . Adopted: Yes  . Colon cancer Neg Hx   . Esophageal cancer Neg Hx   . Rectal cancer Neg Hx   . Stomach cancer Neg Hx    Family history unknown because of his status as being adopted  Social History   Social History  . Marital status: Married    Spouse name: [name]  . Number of children: 3  . Years of education: N/A   Occupational History  . disabled Disabled   Social History Main Topics  . Smoking status: Current Every Day Smoker    Packs/day: 0.50    Years: 32.00    Types: E-cigarettes    Last attempt to quit: 08/23/2014  . Smokeless tobacco: Never Used  .  Alcohol use No  . Drug use: No  . Sexual activity: Yes   Other Topics Concern  . None   Social History Narrative   Drinks 1 cup of caffeine daily.     ROS: Unobtainable    Objective: Vital signs in last 24 hours: Temp:  [97.9 F (36.6 C)-98.4 F (36.9 C)] 97.9 F (36.6 C) (08/23 0736) Pulse Rate:  [79-99] 97 (08/23 0800) Resp:  [24-32] 27 (08/23 0736) BP: (142-211)/(65-95) 211/95 (08/23 0800) SpO2:  [90 %-99 %] 95 % (08/23 0800) FiO2 (%):  [50 %-60 %] 50 % (08/23 0736) Weight change:  Last BM Date:  (PTA)  Intake/Output from previous day: No intake/output data recorded.  PHYSICAL EXAM Constitutional: He is awake but sleepy. He is on BiPAP. Eyes: Pupils  react. Ears nose mouth and throat: His throat is clear. Hearing is grossly normal. Cardiovascular: His heart is regular with normal heart sounds. Respiratory: His respiratory effort is increased. He is on BiPAP. He has bilateral wheezing. Gastrointestinal: His abdomen is soft with no masses. Musculoskeletal: Difficult to assess. Neurological: He sleepy but no focal abnormalities. Psychiatric: Unable to assess. Skin: Warm and dry Lab Results: Basic Metabolic Panel:  Recent Labs  09/12/16 2338 09/13/16 0416  NA 139 137  K 3.4* 3.7  CL 98* 97*  CO2 25 24  GLUCOSE 172* 184*  BUN 43* 50*  CREATININE 8.57* 9.23*  CALCIUM 8.8* 8.6*   Liver Function Tests:  Recent Labs  09/12/16 2338 09/13/16 0416  AST 21 22  ALT 18 18  ALKPHOS 76 71  BILITOT 0.9 1.0  PROT 8.0 7.4  ALBUMIN 4.2 3.9   No results for input(s): LIPASE, AMYLASE in the last 72 hours. No results for input(s): AMMONIA in the last 72 hours. CBC:  Recent Labs  09/12/16 2338 09/13/16 0416  WBC 10.0 9.8  NEUTROABS 8.9*  --   HGB 11.2* 10.2*  HCT 34.6* 31.1*  MCV 97.5 96.9  PLT 117* 105*   Cardiac Enzymes:  Recent Labs  09/12/16 2338 09/13/16 0416  TROPONINI <0.03 <0.03   BNP: No results for input(s): PROBNP in the last 72 hours. D-Dimer: No results for input(s): DDIMER in the last 72 hours. CBG: No results for input(s): GLUCAP in the last 72 hours. Hemoglobin A1C: No results for input(s): HGBA1C in the last 72 hours. Fasting Lipid Panel: No results for input(s): CHOL, HDL, LDLCALC, TRIG, CHOLHDL, LDLDIRECT in the last 72 hours. Thyroid Function Tests: No results for input(s): TSH, T4TOTAL, FREET4, T3FREE, THYROIDAB in the last 72 hours. Anemia Panel: No results for input(s): VITAMINB12, FOLATE, FERRITIN, TIBC, IRON, RETICCTPCT in the last 72 hours. Coagulation: No results for input(s): LABPROT, INR in the last 72 hours. Urine Drug Screen: Drugs of Abuse  No results found for: LABOPIA, COCAINSCRNUR,  LABBENZ, AMPHETMU, THCU, LABBARB  Alcohol Level: No results for input(s): ETH in the last 72 hours. Urinalysis: No results for input(s): COLORURINE, LABSPEC, PHURINE, GLUCOSEU, HGBUR, BILIRUBINUR, KETONESUR, PROTEINUR, UROBILINOGEN, NITRITE, LEUKOCYTESUR in the last 72 hours.  Invalid input(s): APPERANCEUR Misc. Labs:   ABGS:  Recent Labs  09/13/16 0035  PHART 7.396  PO2ART 80.3*  HCO3 24.7     MICROBIOLOGY: Recent Results (from the past 240 hour(s))  MRSA PCR Screening     Status: None   Collection Time: 09/13/16  4:07 AM  Result Value Ref Range Status   MRSA by PCR NEGATIVE NEGATIVE Final    Comment:        The GeneXpert MRSA  Assay (FDA approved for NASAL specimens only), is one component of a comprehensive MRSA colonization surveillance program. It is not intended to diagnose MRSA infection nor to guide or monitor treatment for MRSA infections.     Studies/Results: Dg Chest Port 1 View  Result Date: 09/13/2016 CLINICAL DATA:  Shortness of breath since dialysis today. EXAM: PORTABLE CHEST 1 VIEW COMPARISON:  Frontal and lateral views 03/12/2016 FINDINGS: Mild cardiomegaly, new. Mediastinal contours are unchanged. Central pulmonary vascular engorgement. No confluent airspace disease. No pleural fluid or pneumothorax. Surgical hardware in the lower cervical spine is partially included. IMPRESSION: Cardiomegaly and vascular congestion. Electronically Signed   By: Jeb Levering M.D.   On: 09/13/2016 00:05    Medications:  Prior to Admission:  Facility-Administered Medications Prior to Admission  Medication Dose Route Frequency Provider Last Rate Last Dose  . 0.9 %  sodium chloride infusion  500 mL Intravenous Continuous Milus Banister, MD       Prescriptions Prior to Admission  Medication Sig Dispense Refill Last Dose  . acetaminophen (TYLENOL) 500 MG tablet Take 1 tablet (500 mg total) by mouth every 6 (six) hours as needed for moderate pain.   Past Week  .  amLODipine (NORVASC) 10 MG tablet One twice daily to control BP 180 tablet 3 08/29/2016  . aspirin EC 81 MG tablet Take 81 mg by mouth daily.   08/29/2016  . b complex-vitamin c-folic acid (NEPHRO-VITE) 0.8 MG TABS tablet TAKE 1 TABLET BY MOUTH EVERY DAY 90 tablet 1 08/29/2016  . budesonide-formoterol (SYMBICORT) 80-4.5 MCG/ACT inhaler Inhale 2 puffs into the lungs 2 (two) times daily. 1 Inhaler 1 Unknown  . calcitRIOL (ROCALTROL) 0.25 MCG capsule Take 1 capsule (0.25 mcg total) by mouth every Monday, Wednesday, and Friday with hemodialysis. 30 capsule 1 Past Week  . Cholecalciferol (VITAMIN D3) 5000 UNITS TABS Take 1 tablet by mouth daily.   08/29/2016  . dextromethorphan-guaiFENesin (MUCINEX DM) 30-600 MG 12hr tablet Take 1 tablet by mouth 2 (two) times daily.   08/29/2016  . diphenhydrAMINE (BENADRYL) 25 MG tablet Take 50 mg by mouth 2 (two) times daily as needed for allergies.    08/29/2016  . guaiFENesin-dextromethorphan (ROBITUSSIN DM) 100-10 MG/5ML syrup Take 10 mLs by mouth every 4 (four) hours as needed for cough.   Unknown  . hydrALAZINE (APRESOLINE) 25 MG tablet One twice daily to regulate BP 180 tablet 3 08/29/2016  . ipratropium-albuterol (DUONEB) 0.5-2.5 (3) MG/3ML SOLN Take 3 mLs by nebulization 3 (three) times daily. ICD 10 code J44.9 360 mL 0 Unknown  . metoCLOPramide (REGLAN) 10 MG tablet One before each meal to increase stomach emptying 90 tablet 5 08/28/2016  . metoprolol tartrate (LOPRESSOR) 50 MG tablet One twice daily to regulate BP 180 tablet 3 08/29/2016  . omeprazole (PRILOSEC) 20 MG capsule TAKE 1 CAPSULE (20 MG TOTAL) BY MOUTH 2 (TWO) TIMES DAILY BEFORE A MEAL. 60 capsule 0   . pravastatin (PRAVACHOL) 40 MG tablet Take 1 tablet (40 mg total) by mouth every evening. 90 tablet 1 08/28/2016  . promethazine (PHENERGAN) 25 MG tablet TAKE 1 TABLET BY MOUTH EVERY 8 HOURS AS NEEDED FOR NAUSEA AND VOMITING.. CAN FILL 08/10 90 tablet 1 Unknown  . traMADol (ULTRAM) 50 MG tablet One up to 4 times daly if  needed for pain 100 tablet 3 Unknown   Scheduled: . amLODipine  10 mg Oral Daily  . aspirin EC  81 mg Oral Daily  . [START ON 09/14/2016] calcitRIOL  0.25 mcg Oral  Q M,W,F-HD  . cholecalciferol  5,000 Units Oral Daily  . heparin  5,000 Units Subcutaneous Q8H  . hydrALAZINE  50 mg Oral Q8H  . ipratropium-albuterol  3 mL Nebulization TID  . metoprolol tartrate  50 mg Oral BID  . mometasone-formoterol  2 puff Inhalation BID  . nitroGLYCERIN  1 inch Topical Q6H  . pantoprazole  40 mg Oral Daily  . pravastatin  40 mg Oral QPM  . sodium chloride flush  3 mL Intravenous Q12H   Continuous: . sodium chloride    . sodium chloride    . sodium chloride     JQZ:ESPQZR chloride, sodium chloride, sodium chloride, alteplase, diphenhydrAMINE, guaiFENesin-dextromethorphan, heparin, lidocaine (PF), lidocaine-prilocaine, ondansetron **OR** ondansetron (ZOFRAN) IV, pentafluoroprop-tetrafluoroeth, sodium chloride flush, traMADol  Assesment: He was admitted with pulmonary edema. He has acute respiratory failure from that and he is requiring BiPAP. He has obstructive sleep apnea at baseline and he is on CPAP at home. It appears that he has some element of COPD based on the fact that he's on nebulizer treatments at home. He's on dialysis and hopefully he can have hemofiltration of fluid that may improve his pulmonary edema. If not he is high risk of requiring intubation and mechanical ventilation Active Problems:   OSA on CPAP   ESRD on dialysis [facility])   Pulmonary edema    Plan: Continue BiPAP. See how he does with dialysis and removal of fluid.    LOS: 0 days   Meshelle Holness L 09/13/2016, 8:38 AM

## 2016-09-13 NOTE — Progress Notes (Signed)
Patient was removed from bipap to take breathing treatment. Patient did not tolerate. Became tachypnic and O2 dropped to 90.  Pt not able to tolerate being off bipap at time. Bipap replaced. O2 and RR WNL.  Margaret Pyle, RN

## 2016-09-13 NOTE — Progress Notes (Signed)
**Note De-Identified  Obfuscation** Patient removed from BIPAP  and placed on 2 l Delta.  Patient tolerated for 5 minutes and then placed back onto BIPAP per anxiety, wheeze and increased WOB.  RRT to continue to monitor

## 2016-09-13 NOTE — H&P (Addendum)
TRH H&P    Patient Demographics:    Kirk [name], is a 57 y.o. male  MRN: [id number]  DOB - 10/23/1960  Admit Date - 09/12/2016  Referring MD/NP/PA: Dr Tomi Bamberger  Outpatient Primary MD for the patient is Gayland Curry, DO  Patient coming from: Home  Chief Complaint  Patient presents with  . Shortness of Breath      HPI:    Kirk [name]  is a 56 y.o. male, With history of hypertension, ESRD on hemodialysis Monday Wednesday Friday, obstructive sleep apnea on CPAP, hypertension who came to hospital after he developed shortness of breath starting yesterday after hemodialysis. As per patient's wife patient was fine uneventful hematemesis on Wednesday morning and at 10:30 AM he started having cough which was associated with shortness of breath and some chest pain. When shortness of breath did not improve the came to the ED for further evaluation. In the ED chest x-ray shows pulmonary vascular congestion, and patient started on BiPAP. He was given 1 dose of Lasix 100 mg IV 1, Nitropaste 15 mg x1. BNP elevated to 573. He denies nausea vomiting or diarrhea. He still makes some urine. He denies fever. But had 3 episodes of profuse sweating at home.     Review of systems:     All other systems reviewed and are negative.   With Past History of the following :    Past Medical History:  Diagnosis Date  . Acute edema of lung, unspecified   . Acute, but ill-defined, cerebrovascular disease   . Allergy   . Anemia   . 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)   . Anxiety   . Asthma   . Carpal tunnel syndrome   . Cellulitis and abscess of trunk   . Cholelithiasis 07/13/2014  . Chronic headaches   . Debility, unspecified   . Dermatophytosis of the body   . Dysrhythmia    history of  . Edema   . End stage renal disease on dialysis [facility])    "MWF; [facility]" (10/21/2014)  . Essential  hypertension, benign   . GERD (gastroesophageal reflux disease)   . Gout, unspecified   . HTN (hypertension)   . Hypertrophy of prostate without urinary obstruction and other lower urinary tract symptoms (LUTS)   . Hypotension, unspecified   . Impotence of organic origin   . Insomnia, unspecified   . Kidney replaced by transplant   . Localization-related (focal) (partial) epilepsy and epileptic syndromes with complex partial seizures, without mention of intractable epilepsy   . Lumbago   . Memory loss   . OSA on CPAP   . Other and unspecified hyperlipidemia   . Other chronic nonalcoholic liver disease   . Other malaise and fatigue   . Other nonspecific abnormal serum enzyme levels   . Pain in joint, lower leg   . Pain in joint, upper arm   . Pneumonia "several times"  . Renal dialysis status(V45.11) 02/05/2010   restarted 01/02/13 ofter renal trransplant failure  . Secondary hyperparathyroidism (of renal origin)   .  Shortness of breath   . Sleep apnea   . Tension headache   . Unspecified constipation   . Unspecified essential hypertension   . Unspecified hereditary and idiopathic peripheral neuropathy   . Unspecified vitamin D deficiency       Past Surgical History:  Procedure Laterality Date  . AV FISTULA PLACEMENT Left ?2010   "forearm; at [facility]"  . BACK SURGERY    . CARDIAC CATHETERIZATION  03/21/2011  . CHOLECYSTECTOMY N/A 10/21/2014   Procedure: LAPAROSCOPIC CHOLECYSTECTOMY WITH INTRAOPERATIVE CHOLANGIOGRAM;  Surgeon: Autumn Messing III, MD;  Location: [place];  Service: General;  Laterality: N/A;  . COLONOSCOPY    . 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 KIDNEY TRANSPLANT  08/17/2011   [place]  10/21/2014   w/IOC  . LEFT HEART CATHETERIZATION WITH CORONARY ANGIOGRAM N/A 03/21/2011   Procedure: LEFT HEART CATHETERIZATION WITH CORONARY ANGIOGRAM;  Surgeon: [name], MD;  Location: [facility] CATH LAB;   Service: Cardiovascular;  Laterality: N/A;  . NEPHRECTOMY  08/2013   removed transplaned kidney  . POSTERIOR FUSION CERVICAL SPINE  06/25/2012   for spinal stenosis  . VASECTOMY  2010      Social History:      Social History  Substance Use Topics  . Smoking status: Current Every Day Smoker    Packs/day: 0.50    Years: 32.00    Types: E-cigarettes    Last attempt to quit: 08/23/2014  . Smokeless tobacco: Never Used  . Alcohol use No       Family History :     Family History  Problem Relation Age of Onset  . Adopted: Yes  . Colon cancer Neg Hx   . Esophageal cancer Neg Hx   . Rectal cancer Neg Hx   . Stomach cancer Neg Hx       Home Medications:   Prior to Admission medications   Medication Sig Start Date End Date Taking? Authorizing Provider  acetaminophen (TYLENOL) 500 MG tablet Take 1 tablet (500 mg total) by mouth every 6 (six) hours as needed for moderate pain. 09/12/14   Samuella Cota, MD  amLODipine (NORVASC) 10 MG tablet One twice daily to control BP 06/21/16   Lauree Chandler, NP  aspirin EC 81 MG tablet Take 81 mg by mouth daily.    [provider]  b complex-vitamin c-folic acid (NEPHRO-VITE) 0.8 MG TABS tablet TAKE 1 TABLET BY MOUTH EVERY DAY 03/29/16   Estill Dooms, MD  budesonide-formoterol [facility]) 80-4.5 MCG/ACT inhaler Inhale 2 puffs into the lungs 2 (two) times daily. 09/21/14   Bonnielee Haff, MD  calcitRIOL (ROCALTROL) 0.25 MCG capsule Take 1 capsule (0.25 mcg total) by mouth every Monday, Wednesday, and Friday with hemodialysis. 09/21/14   Bonnielee Haff, MD  Cholecalciferol (VITAMIN D3) 5000 UNITS TABS Take 1 tablet by mouth daily.    [provider]  dextromethorphan-guaiFENesin (MUCINEX DM) 30-600 MG 12hr tablet Take 1 tablet by mouth 2 (two) times daily.    [provider]  diphenhydrAMINE (BENADRYL) 25 MG tablet Take 50 mg by mouth 2 (two) times daily as needed for allergies.     [provider]    guaiFENesin-dextromethorphan (ROBITUSSIN DM) 100-10 MG/5ML syrup Take 10 mLs by mouth every 4 (four) hours as needed for cough.    [provider]  hydrALAZINE (APRESOLINE) 25 MG tablet One twice daily to regulate BP 06/21/16   Lauree Chandler,  NP  ipratropium-albuterol (DUONEB) 0.5-2.5 (3) MG/3ML SOLN Take 3 mLs by nebulization 3 (three) times daily. ICD 10 code J44.9 12/11/15   Kathie Dike, MD  metoCLOPramide (REGLAN) 10 MG tablet One before each meal to increase stomach emptying 12/14/15   Estill Dooms, MD  metoprolol tartrate (LOPRESSOR) 50 MG tablet One twice daily to regulate BP 06/21/16   Lauree Chandler, NP  omeprazole (PRILOSEC) 20 MG capsule TAKE 1 CAPSULE (20 MG TOTAL) BY MOUTH 2 (TWO) TIMES DAILY BEFORE A MEAL. 09/10/16   Milus Banister, MD  pravastatin (PRAVACHOL) 40 MG tablet Take 1 tablet (40 mg total) by mouth every evening. 06/21/16   Lauree Chandler, NP  promethazine (PHENERGAN) 25 MG tablet TAKE 1 TABLET BY MOUTH EVERY 8 HOURS AS NEEDED FOR NAUSEA AND VOMITING.. CAN FILL 08/10 01/25/16   Estill Dooms, MD  traMADol Veatrice Bourbon) 50 MG tablet One up to 4 times daly if needed for pain 12/14/15   Estill Dooms, MD     Allergies:     Allergies  Allergen Reactions  . Codeine Nausea And Vomiting and Nausea Only    Altered mental status     Physical Exam:   Vitals  Blood pressure (!) 188/87, pulse 93, resp. rate (!) 32, SpO2 98 %.  1.  General: Appears in moderate respiratory distress, on BiPAP.  2. Psychiatric:  Intact judgement and  insight, awake alert, oriented x 3.  3. Neurologic: No focal neurological deficits, all cranial nerves intact.Strength 5/5 all 4 extremities, sensation intact all 4 extremities, plantars down going.  4. Eyes :  anicteric sclerae, moist conjunctivae with no lid lag. PERRLA.  5. ENMT:  Oropharynx clear with moist mucous membranes and good dentition  6. Neck:  supple, no cervical lymphadenopathy appriciated, No  thyromegaly  7. Respiratory : Bilateral rhonchi auscultated  8. Cardiovascular : RRR, no gallops, rubs or murmurs, no leg edema  9. Gastrointestinal:  Positive bowel sounds, abdomen soft, non-tender to palpation,no hepatosplenomegaly, no rigidity or guarding       10. Skin:  No cyanosis, normal texture and turgor, no rash, lesions or ulcers  11.Musculoskeletal:  Good muscle tone,  joints appear normal , no effusions,  normal range of motion    Data Review:    CBC  Recent Labs Lab 09/12/16 2338  WBC 10.0  HGB 11.2*  HCT 34.6*  PLT 117*  MCV 97.5  MCH 31.5  MCHC 32.4  RDW 15.6*  LYMPHSABS 0.8  MONOABS 0.3  EOSABS 0.1  BASOSABS 0.0   ------------------------------------------------------------------------------------------------------------------  Chemistries   Recent Labs Lab 09/12/16 2338  NA 139  K 3.4*  CL 98*  CO2 25  GLUCOSE 172*  BUN 43*  CREATININE 8.57*  CALCIUM 8.8*  AST 21  ALT 18  ALKPHOS 76  BILITOT 0.9   ------------------------------------------------------------------------------------------------------------------  ------------------------------------------------------------------------------------------------------------------ GFR: CrCl cannot be calculated (Unknown ideal weight.). Liver Function Tests:  Recent Labs Lab 09/12/16 2338  AST 21  ALT 18  ALKPHOS 76  BILITOT 0.9  PROT 8.0  ALBUMIN 4.2   No results for input(s): LIPASE, AMYLASE in the last 168 hours. No results for input(s): AMMONIA in the last 168 hours. Coagulation Profile: No results for input(s): INR, PROTIME in the last 168 hours. Cardiac Enzymes:  Recent Labs Lab 09/12/16 2338  TROPONINI <0.03   BNP (last 3 results) No results for input(s): PROBNP in the last 8760 hours. HbA1C: No results for input(s): HGBA1C in the last 72 hours. CBG: No  results for input(s): GLUCAP in the last 168 hours. Lipid Profile: No results for input(s): CHOL, HDL,  LDLCALC, TRIG, CHOLHDL, LDLDIRECT in the last 72 hours. Thyroid Function Tests: No results for input(s): TSH, T4TOTAL, FREET4, T3FREE, THYROIDAB in the last 72 hours. Anemia Panel: No results for input(s): VITAMINB12, FOLATE, FERRITIN, TIBC, IRON, RETICCTPCT in the last 72 hours.  --------------------------------------------------------------------------------------------------------------- Urine analysis:    Component Value Date/Time   COLORURINE YELLOW 04/06/2014 1030   APPEARANCEUR CLEAR 04/06/2014 1030   LABSPEC 1.020 04/06/2014 1030   PHURINE 8.0 04/06/2014 1030   GLUCOSEU 100 (A) 04/06/2014 1030   HGBUR MODERATE (A) 04/06/2014 1030   BILIRUBINUR NEGATIVE 04/06/2014 1030   KETONESUR NEGATIVE 04/06/2014 1030   PROTEINUR >300 (A) 04/06/2014 1030   UROBILINOGEN 0.2 04/06/2014 1030   NITRITE NEGATIVE 04/06/2014 1030   LEUKOCYTESUR NEGATIVE 04/06/2014 1030      Imaging Results:    Dg Chest Port 1 View  Result Date: 09/13/2016 CLINICAL DATA:  Shortness of breath since dialysis today. EXAM: PORTABLE CHEST 1 VIEW COMPARISON:  Frontal and lateral views 03/12/2016 FINDINGS: Mild cardiomegaly, new. Mediastinal contours are unchanged. Central pulmonary vascular engorgement. No confluent airspace disease. No pleural fluid or pneumothorax. Surgical hardware in the lower cervical spine is partially included. IMPRESSION: Cardiomegaly and vascular congestion. Electronically Signed   By: Jeb Levering M.D.   On: 09/13/2016 00:05   EKG-showed normal sinus rhythm   Assessment & Plan:    Active Problems:   OSA on CPAP   ESRD on dialysis [facility])   Pulmonary edema   1. Pulmonary edema- chest x-ray shows cardiomegaly and vascular congestion, currently on BiPAP. He has improved with BiPAP .Will place under observation in stepdown unit. Patient will need hemodialysis in a.m. Consult nephrology in a.m.Continue Nitropaste 15 mg 1 inch every 6 hours, will also check serial troponin. 2. ESRD on  hemodialysis- consult nephrology in a.m. 3. Obstructive sleep apnea-continue BiPAP. 4. Hypertension-blood pressure is elevated, continue amlodipine, metoprolol. Will start hydralazine 50 mg by mouth every 8 hours.    DVT Prophylaxis-  Heparin  AM Labs Ordered, also please review Full Orders  Family Communication: Admission, patients condition and plan of care including tests being ordered have been discussed with the patient and his wife at bedside who indicate understanding and agree with the plan and Code Status.  Code Status:  Full code  Admission status: Observation    Time spent in minutes : 60 minutes   Ariya Bohannon S M.D on 09/13/2016 at 2:35 AM  Between 7am to 7pm - Pager - [phone]. After 7pm go to [email or web address] - password [facility]  [facility] - Office  [phone]

## 2016-09-13 NOTE — Progress Notes (Signed)
RT called to ER to assess patient because of desaturation.  Patient had actually taken BIPAP mask off and turned machine off HIMSELF; then he desatted.  Upon arrival patient was back on BIPAP and sats were 95%.  Patient seemed to be very anxious.  Offered to take patient off machine for a little bit, but patient declined. Told patient and family not to touch our BIPAP machine because it is very dangerous to turn the machine off so the alarms do not sound. Explained to patient he can take mask off if he gets in a panic but he is not to turn machine off so that staff is aware that something is not right and he needs to be checked on.  Discussed all this with RN.

## 2016-09-14 ENCOUNTER — Inpatient Hospital Stay (HOSPITAL_COMMUNITY): Payer: Medicare Other

## 2016-09-14 LAB — RENAL FUNCTION PANEL
ANION GAP: 17 — AB (ref 5–15)
Albumin: 4 g/dL (ref 3.5–5.0)
BUN: 50 mg/dL — AB (ref 6–20)
CALCIUM: 8.8 mg/dL — AB (ref 8.9–10.3)
CO2: 26 mmol/L (ref 22–32)
CREATININE: 9.7 mg/dL — AB (ref 0.61–1.24)
Chloride: 93 mmol/L — ABNORMAL LOW (ref 101–111)
GFR calc Af Amer: 6 mL/min — ABNORMAL LOW (ref >60)
GFR calc non Af Amer: 5 mL/min — ABNORMAL LOW (ref >60)
GLUCOSE: 134 mg/dL — AB (ref 65–99)
PHOSPHORUS: 8.2 mg/dL — AB (ref 2.5–4.6)
Potassium: 3.9 mmol/L (ref 3.5–5.1)
SODIUM: 136 mmol/L (ref 135–145)

## 2016-09-14 LAB — CBC
HCT: 33.4 % — ABNORMAL LOW (ref 39.0–52.0)
Hemoglobin: 10.8 g/dL — ABNORMAL LOW (ref 13.0–17.0)
MCH: 31.8 pg (ref 26.0–34.0)
MCHC: 32.3 g/dL (ref 30.0–36.0)
MCV: 98.2 fL (ref 78.0–100.0)
PLATELETS: 119 10*3/uL — AB (ref 150–400)
RBC: 3.4 MIL/uL — ABNORMAL LOW (ref 4.22–5.81)
RDW: 15.9 % — AB (ref 11.5–15.5)
WBC: 6 10*3/uL (ref 4.0–10.5)

## 2016-09-14 LAB — HEPATITIS B SURFACE ANTIGEN: HEP B S AG: NEGATIVE

## 2016-09-14 MED ORDER — EPOETIN ALFA 2000 UNIT/ML IJ SOLN
1000.0000 [IU] | INTRAMUSCULAR | Status: DC
  Administered 2016-09-14: 1000 [IU] via INTRAVENOUS
  Filled 2016-09-14 (×2): qty 1

## 2016-09-14 MED ORDER — LORAZEPAM 0.5 MG PO TABS
0.5000 mg | ORAL_TABLET | Freq: Once | ORAL | Status: AC
  Administered 2016-09-14: 0.5 mg via ORAL
  Filled 2016-09-14: qty 1

## 2016-09-14 MED ORDER — METHYLPREDNISOLONE SODIUM SUCC 125 MG IJ SOLR
60.0000 mg | Freq: Four times a day (QID) | INTRAMUSCULAR | Status: DC
Start: 2016-09-14 — End: 2016-09-20
  Administered 2016-09-14 – 2016-09-20 (×23): 60 mg via INTRAVENOUS
  Filled 2016-09-14 (×25): qty 2

## 2016-09-14 MED ORDER — HEPARIN SODIUM (PORCINE) 1000 UNIT/ML DIALYSIS
20.0000 [IU]/kg | INTRAMUSCULAR | Status: DC | PRN
  Filled 2016-09-14: qty 3

## 2016-09-14 MED ORDER — EPOETIN ALFA 2000 UNIT/ML IJ SOLN
INTRAMUSCULAR | Status: AC
Start: 2016-09-14 — End: 2016-09-14
  Administered 2016-09-14: 1000 [IU] via INTRAVENOUS
  Filled 2016-09-14: qty 1

## 2016-09-14 MED ORDER — SODIUM CHLORIDE 0.9 % IV SOLN: 100.0000 mL | INTRAVENOUS | Status: DC | PRN

## 2016-09-14 MED ORDER — SEVELAMER CARBONATE 800 MG PO TABS
800.0000 mg | ORAL_TABLET | Freq: Three times a day (TID) | ORAL | Status: DC
  Administered 2016-09-14 – 2016-09-17 (×8): 800 mg via ORAL
  Filled 2016-09-14 (×8): qty 1

## 2016-09-14 NOTE — Progress Notes (Signed)
**Note De-Identified  Obfuscation** Patient removed from BIPAP and placed on 6 L [place].  Patient tolerating well at this time.  RRT to continue to monitor.

## 2016-09-14 NOTE — Plan of Care (Signed)
Problem: Health Behavior/Discharge Planning: Goal: Ability to manage health-related needs will improve Outcome: Progressing RN expressed importance of cessation of smoking or any tobacco use. Patient verbalized understanding and stated he was going to quit. Family in agreement.  Problem: Activity: Goal: Risk for activity intolerance will decrease Outcome: Progressing Patient becomes severely SOB while doing any activity. Patient not able to tolerate activities without use of Bipap.

## 2016-09-14 NOTE — Consult Note (Addendum)
Kirk [name] MRN: [id number] DOB/AGE: [birth year] 56 y.o. Primary Care Physician:Reed, Tiffany L, DO Admit date: 09/12/2016 Chief Complaint:  Chief Complaint  Patient presents with  . Shortness of Breath   HPI: Pt is 56 year old male with past medical hx of ESRD who came to ER with c/o shortness of breath.  HPI dates back to yesterday when pt had  onset of dyspnea.progressive in nature.Pt wife says later was have profuse sweating and  so pt came to ER Pt says " I had dialysis  but I am still short of breath" Pt  c/o cough., non productive.  In ER pt was found to have Pulmonary edema  and was admitted after starting on BIPAP. NO c/o abdominal pain No c/o diarrhea NO c/o syncope NO c/o recent travel.  Past Medical History:  Diagnosis Date  . Acute edema of lung, unspecified   . Acute, but ill-defined, cerebrovascular disease   . Allergy   . Anemia   . 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)   . Anxiety   . Asthma   . Carpal tunnel syndrome   . Cellulitis and abscess of trunk   . Cholelithiasis 07/13/2014  . Chronic headaches   . Debility, unspecified   . Dermatophytosis of the body   . Dysrhythmia    history of  . Edema   . End stage renal disease on dialysis [facility])    "MWF; [facility]" (10/21/2014)  . Essential hypertension, benign   . GERD (gastroesophageal reflux disease)   . Gout, unspecified   . HTN (hypertension)   . Hypertrophy of prostate without urinary obstruction and other lower urinary tract symptoms (LUTS)   . Hypotension, unspecified   . Impotence of organic origin   . Insomnia, unspecified   . Kidney replaced by transplant   . Localization-related (focal) (partial) epilepsy and epileptic syndromes with complex partial seizures, without mention of intractable epilepsy   . Lumbago   . Memory loss   . OSA on CPAP   . Other and unspecified hyperlipidemia   . Other chronic nonalcoholic liver disease   . Other malaise and fatigue   .  Other nonspecific abnormal serum enzyme levels   . Pain in joint, lower leg   . Pain in joint, upper arm   . Pneumonia "several times"  . Renal dialysis status(V45.11) 02/05/2010   restarted 01/02/13 ofter renal trransplant failure  . Secondary hyperparathyroidism (of renal origin)   . Shortness of breath   . Sleep apnea   . Tension headache   . Unspecified constipation   . Unspecified essential hypertension   . Unspecified hereditary and idiopathic peripheral neuropathy   . Unspecified vitamin D deficiency        Family History  Problem Relation Age of Onset  . Adopted: Yes  . Colon cancer Neg Hx   . Esophageal cancer Neg Hx   . Rectal cancer Neg Hx   . Stomach cancer Neg Hx     Social History:  reports that he has been smoking E-cigarettes.  He has a 16.00 pack-year smoking history. He has never used smokeless tobacco. He reports that he does not drink alcohol or use drugs.   Allergies:  Allergies  Allergen Reactions  . Codeine Nausea And Vomiting and Nausea Only    Altered mental status    Facility-Administered Medications Prior to Admission  Medication Dose Route Frequency Provider Last Rate Last Dose  . 0.9 %  sodium chloride infusion  500 mL  Intravenous Continuous Milus Banister, MD       Medications Prior to Admission  Medication Sig Dispense Refill  . acetaminophen (TYLENOL) 500 MG tablet Take 1 tablet (500 mg total) by mouth every 6 (six) hours as needed for moderate pain.    Marland Kitchen amLODipine (NORVASC) 10 MG tablet One twice daily to control BP 180 tablet 3  . aspirin EC 81 MG tablet Take 81 mg by mouth daily.    Marland Kitchen b complex-vitamin c-folic acid (NEPHRO-VITE) 0.8 MG TABS tablet TAKE 1 TABLET BY MOUTH EVERY DAY 90 tablet 1  . budesonide-formoterol (SYMBICORT) 80-4.5 MCG/ACT inhaler Inhale 2 puffs into the lungs 2 (two) times daily. 1 Inhaler 1  . calcitRIOL (ROCALTROL) 0.25 MCG capsule Take 1 capsule (0.25 mcg total) by mouth every Monday, Wednesday, and Friday  with hemodialysis. 30 capsule 1  . Cholecalciferol (VITAMIN D3) 5000 UNITS TABS Take 1 tablet by mouth daily.    Marland Kitchen dextromethorphan-guaiFENesin (MUCINEX DM) 30-600 MG 12hr tablet Take 1 tablet by mouth 2 (two) times daily.    . diphenhydrAMINE (BENADRYL) 25 MG tablet Take 50 mg by mouth 2 (two) times daily as needed for allergies.     Marland Kitchen guaiFENesin-dextromethorphan (ROBITUSSIN DM) 100-10 MG/5ML syrup Take 10 mLs by mouth every 4 (four) hours as needed for cough.    . hydrALAZINE (APRESOLINE) 25 MG tablet One twice daily to regulate BP (Patient taking differently: Take 25 mg by mouth 3 (three) times daily. One twice daily to regulate BP) 180 tablet 3  . ipratropium-albuterol (DUONEB) 0.5-2.5 (3) MG/3ML SOLN Take 3 mLs by nebulization 3 (three) times daily. ICD 10 code J44.9 360 mL 0  . metoCLOPramide (REGLAN) 10 MG tablet One before each meal to increase stomach emptying (Patient taking differently: Take 10 mg by mouth 3 (three) times daily before meals. One before each meal to increase stomach emptying) 90 tablet 5  . metoprolol tartrate (LOPRESSOR) 50 MG tablet One twice daily to regulate BP 180 tablet 3  . omeprazole (PRILOSEC) 20 MG capsule TAKE 1 CAPSULE (20 MG TOTAL) BY MOUTH 2 (TWO) TIMES DAILY BEFORE A MEAL. 60 capsule 0  . pravastatin (PRAVACHOL) 40 MG tablet Take 1 tablet (40 mg total) by mouth every evening. 90 tablet 1  . promethazine (PHENERGAN) 25 MG tablet TAKE 1 TABLET BY MOUTH EVERY 8 HOURS AS NEEDED FOR NAUSEA AND VOMITING.. CAN FILL 08/10 90 tablet 1  . traMADol (ULTRAM) 50 MG tablet One up to 4 times daly if needed for pain (Patient taking differently: Take 50 mg by mouth 4 (four) times daily as needed for moderate pain. One up to 4 times daly if needed for pain) 100 tablet 3       DQQ:IWLNL from the symptoms mentioned above,there are no other symptoms referable to all systems reviewed.  Marland Kitchen amLODipine  10 mg Oral Daily  . aspirin EC  81 mg Oral Daily  . calcitRIOL  0.25 mcg  Oral Q M,W,F-HD  . cholecalciferol  5,000 Units Oral Daily  . heparin  5,000 Units Subcutaneous Q8H  . hydrALAZINE  50 mg Oral Q8H  . ipratropium-albuterol  3 mL Nebulization TID  . metoprolol tartrate  50 mg Oral BID  . mometasone-formoterol  2 puff Inhalation BID  . nitroGLYCERIN  1 inch Topical Q6H  . pantoprazole  40 mg Oral Daily  . pravastatin  40 mg Oral QPM  . sodium chloride flush  3 mL Intravenous Q12H        Physical  Exam: Vital signs in last 24 hours: Temp:  [97.4 F (36.3 C)-98.4 F (36.9 C)] 97.9 F (36.6 C) (08/24 0811) Pulse Rate:  [75-97] 77 (08/24 0827) Resp:  [19-28] 22 (08/24 0731) BP: (108-165)/(56-97) 152/74 (08/24 0600) SpO2:  [87 %-99 %] 92 % (08/24 0827) FiO2 (%):  [50 %] 50 % (08/24 0731) Weight:  [227 lb 1.2 oz (103 kg)] 227 lb 1.2 oz (103 kg) (08/23 1725) Weight change:  Last BM Date: 09/12/16  Intake/Output from previous day: 08/23 0701 - 08/24 0700 In: 670 [P.O.:670] Out: 2500  No intake/output data recorded.   Physical Exam: General- pt is awake,alert, oriented to time place and person HEENT- Bipap in situ  Resp- no REsp distress,Rhonchi+ CVS- S1S2 regular in rate and rhythm GIT- BS+, soft, NT, ND, surgical scar for transplant present EXT- NO LE Edema, NOCyanosis CNS- CN 2-12 grossly intact. Moving all 4 extremities Psych- normal mood and affect Access-AVF+    Lab Results: CBC  Recent Labs  09/13/16 0416 09/14/16 0459  WBC 9.8 6.0  HGB 10.2* 10.8*  HCT 31.1* 33.4*  PLT 105* 119*    BMET  Recent Labs  09/13/16 0416 09/14/16 0459  NA 137 136  K 3.7 3.9  CL 97* 93*  CO2 24 26  GLUCOSE 184* 134*  BUN 50* 50*  CREATININE 9.23* 9.70*  CALCIUM 8.6* 8.8*    MICRO Recent Results (from the past 240 hour(s))  MRSA PCR Screening     Status: None   Collection Time: 09/13/16  4:07 AM  Result Value Ref Range Status   MRSA by PCR NEGATIVE NEGATIVE Final    Comment:        The GeneXpert MRSA Assay (FDA approved for  NASAL specimens only), is one component of a comprehensive MRSA colonization surveillance program. It is not intended to diagnose MRSA infection nor to guide or monitor treatment for MRSA infections.       Lab Results  Component Value Date   PTH 429 04/30/2015   CALCIUM 8.8 (L) 09/14/2016   CAION 1.02 (L) 09/08/2013   PHOS 8.2 (H) 09/14/2016    Impression: 1)Renal  ESRD on Hd               Pt is On Monday/Wednesday/Friday schedule               will dialyze today               Hx of failed transplant  2)HTN  Medication- On Calcium Channel Blockers On Alpha and beta Blockers   3)Anemia In ESRD the goal for hgb ia 9--11    Pt hgb is at goal   4)CKD Mineral-Bone Disorder Secondary Hyperparathyroidism present  Phosphorus not at goal.   5)Resp  admitted with pulmonary edema Primary MD following  6)Electrolytes Normokalemic NOrmonatremic   7)Acid base Co2 at goal     Plan:  Will dialyze today Will use 3  [place] Will keep on low dose epo Will start binders   Addendum Pt seen on dialysis Pt tolerating tx well Will increase the UF goal to help with dyspnea    Sunil Hue S 09/14/2016, 9:26 AM

## 2016-09-14 NOTE — Progress Notes (Signed)
Subjective: He remains on BiPAP. Attempts to get him off of BiPAP yesterday were unsuccessful. He says he feels very short of breath when he tries to come off. He has asthma at baseline now that I can get a little more history from him. He's coughing some. He is going to have dialysis again today.  Objective: Vital signs in last 24 hours: Temp:  [97.4 F (36.3 C)-98.4 F (36.9 C)] 97.9 F (36.6 C) (08/24 0811) Pulse Rate:  [75-97] 76 (08/24 0731) Resp:  [19-28] 22 (08/24 0731) BP: (108-168)/(56-107) 152/74 (08/24 0600) SpO2:  [87 %-99 %] 98 % (08/24 0731) FiO2 (%):  [50 %] 50 % (08/24 0731) Weight:  [103 kg (227 lb 1.2 oz)] 103 kg (227 lb 1.2 oz) (08/23 1725) Weight change:  Last BM Date: 09/12/16  Intake/Output from previous day: 08/23 0701 - 08/24 0700 In: 670 [P.O.:670] Out: 2500   PHYSICAL EXAM General appearance: alert, cooperative and mild distress Resp: clear to auscultation bilaterally Cardio: regular rate and rhythm, S1, S2 normal, no murmur, click, rub or gallop GI: soft, non-tender; bowel sounds normal; no masses,  no organomegaly Extremities: extremities normal, atraumatic, no cyanosis or edema Skin warm and dry. His lungs show crackles bilaterally not clear bilaterally  Lab Results:  Results for orders placed or performed during the hospital encounter of 09/12/16 (from the past 48 hour(s))  Comprehensive metabolic panel     Status: Abnormal   Collection Time: 09/12/16 11:38 PM  Result Value Ref Range   Sodium 139 135 - 145 mmol/L   Potassium 3.4 (L) 3.5 - 5.1 mmol/L   Chloride 98 (L) 101 - 111 mmol/L   CO2 25 22 - 32 mmol/L   Glucose, Bld 172 (H) 65 - 99 mg/dL   BUN 43 (H) 6 - 20 mg/dL   Creatinine, Ser 8.57 (H) 0.61 - 1.24 mg/dL   Calcium 8.8 (L) 8.9 - 10.3 mg/dL   Total Protein 8.0 6.5 - 8.1 g/dL   Albumin 4.2 3.5 - 5.0 g/dL   AST 21 15 - 41 U/L   ALT 18 17 - 63 U/L   Alkaline Phosphatase 76 38 - 126 U/L   Total Bilirubin 0.9 0.3 - 1.2 mg/dL   GFR  calc non Af Amer 6 (L) >60 mL/min   GFR calc Af Amer 7 (L) >60 mL/min    Comment: (NOTE) The eGFR has been calculated using the CKD EPI equation. This calculation has not been validated in all clinical situations. eGFR's persistently <60 mL/min signify possible Chronic Kidney Disease.    Anion gap 16 (H) 5 - 15  CBC with Differential     Status: Abnormal   Collection Time: 09/12/16 11:38 PM  Result Value Ref Range   WBC 10.0 4.0 - 10.5 K/uL   RBC 3.55 (L) 4.22 - 5.81 MIL/uL   Hemoglobin 11.2 (L) 13.0 - 17.0 g/dL   HCT 34.6 (L) 39.0 - 52.0 %   MCV 97.5 78.0 - 100.0 fL   MCH 31.5 26.0 - 34.0 pg   MCHC 32.4 30.0 - 36.0 g/dL   RDW 15.6 (H) 11.5 - 15.5 %   Platelets 117 (L) 150 - 400 K/uL    Comment: SPECIMEN CHECKED FOR CLOTS PLATELET COUNT CONFIRMED BY SMEAR    Neutrophils Relative % 89 %   Neutro Abs 8.9 (H) 1.7 - 7.7 K/uL   Lymphocytes Relative 8 %   Lymphs Abs 0.8 0.7 - 4.0 K/uL   Monocytes Relative 3 %   Monocytes  Absolute 0.3 0.1 - 1.0 K/uL   Eosinophils Relative 1 %   Eosinophils Absolute 0.1 0.0 - 0.7 K/uL   Basophils Relative 0 %   Basophils Absolute 0.0 0.0 - 0.1 K/uL  Troponin I     Status: None   Collection Time: 09/12/16 11:38 PM  Result Value Ref Range   Troponin I <0.03 <0.03 ng/mL  Brain natriuretic peptide     Status: Abnormal   Collection Time: 09/12/16 11:38 PM  Result Value Ref Range   B Natriuretic Peptide 517.0 (H) 0.0 - 100.0 pg/mL  Blood gas, arterial     Status: Abnormal   Collection Time: 09/13/16 12:35 AM  Result Value Ref Range   FIO2 60.00    Delivery systems BILEVEL POSITIVE AIRWAY PRESSURE    Inspiratory PAP 14    Expiratory PAP 6.0    pH, Arterial 7.396 7.350 - 7.450   pCO2 arterial 41.3 32.0 - 48.0 mmHg   pO2, Arterial 80.3 (L) 83.0 - 108.0 mmHg   Bicarbonate 24.7 20.0 - 28.0 mmol/L   Acid-Base Excess 0.5 0.0 - 2.0 mmol/L   O2 Saturation 94.7 %   Patient temperature 37.0    Collection site RIGHT RADIAL    Drawn by [id number]    Sample  type ARTERIAL DRAW    Allens test (pass/fail) PASS PASS  MRSA PCR Screening     Status: None   Collection Time: 09/13/16  4:07 AM  Result Value Ref Range   MRSA by PCR NEGATIVE NEGATIVE    Comment:        The GeneXpert MRSA Assay (FDA approved for NASAL specimens only), is one component of a comprehensive MRSA colonization surveillance program. It is not intended to diagnose MRSA infection nor to guide or monitor treatment for MRSA infections.   Troponin I (q 6hr x 3)     Status: None   Collection Time: 09/13/16  4:16 AM  Result Value Ref Range   Troponin I <0.03 <0.03 ng/mL  CBC     Status: Abnormal   Collection Time: 09/13/16  4:16 AM  Result Value Ref Range   WBC 9.8 4.0 - 10.5 K/uL   RBC 3.21 (L) 4.22 - 5.81 MIL/uL   Hemoglobin 10.2 (L) 13.0 - 17.0 g/dL   HCT 31.1 (L) 39.0 - 52.0 %   MCV 96.9 78.0 - 100.0 fL   MCH 31.8 26.0 - 34.0 pg   MCHC 32.8 30.0 - 36.0 g/dL   RDW 15.6 (H) 11.5 - 15.5 %   Platelets 105 (L) 150 - 400 K/uL    Comment: SPECIMEN CHECKED FOR CLOTS CONSISTENT WITH PREVIOUS RESULT   Comprehensive metabolic panel     Status: Abnormal   Collection Time: 09/13/16  4:16 AM  Result Value Ref Range   Sodium 137 135 - 145 mmol/L   Potassium 3.7 3.5 - 5.1 mmol/L   Chloride 97 (L) 101 - 111 mmol/L   CO2 24 22 - 32 mmol/L   Glucose, Bld 184 (H) 65 - 99 mg/dL   BUN 50 (H) 6 - 20 mg/dL   Creatinine, Ser 9.23 (H) 0.61 - 1.24 mg/dL   Calcium 8.6 (L) 8.9 - 10.3 mg/dL   Total Protein 7.4 6.5 - 8.1 g/dL   Albumin 3.9 3.5 - 5.0 g/dL   AST 22 15 - 41 U/L   ALT 18 17 - 63 U/L   Alkaline Phosphatase 71 38 - 126 U/L   Total Bilirubin 1.0 0.3 - 1.2 mg/dL   GFR  calc non Af Amer 6 (L) >60 mL/min   GFR calc Af Amer 7 (L) >60 mL/min    Comment: (NOTE) The eGFR has been calculated using the CKD EPI equation. This calculation has not been validated in all clinical situations. eGFR's persistently <60 mL/min signify possible Chronic Kidney Disease.    Anion gap 16 (H)  5 - 15  Troponin I (q 6hr x 3)     Status: None   Collection Time: 09/13/16  9:39 AM  Result Value Ref Range   Troponin I <0.03 <0.03 ng/mL  Hepatitis B surface antigen     Status: None   Collection Time: 09/13/16  9:39 AM  Result Value Ref Range   Hepatitis B Surface Ag Negative Negative    Comment: (NOTE) Performed At: [facility] [place], [place] [id number] Lindon Romp MD [id number]   Troponin I (q 6hr x 3)     Status: None   Collection Time: 09/13/16  3:38 PM  Result Value Ref Range   Troponin I <0.03 <0.03 ng/mL  CBC     Status: Abnormal   Collection Time: 09/14/16  4:59 AM  Result Value Ref Range   WBC 6.0 4.0 - 10.5 K/uL   RBC 3.40 (L) 4.22 - 5.81 MIL/uL   Hemoglobin 10.8 (L) 13.0 - 17.0 g/dL   HCT 33.4 (L) 39.0 - 52.0 %   MCV 98.2 78.0 - 100.0 fL   MCH 31.8 26.0 - 34.0 pg   MCHC 32.3 30.0 - 36.0 g/dL   RDW 15.9 (H) 11.5 - 15.5 %   Platelets 119 (L) 150 - 400 K/uL    Comment: SPECIMEN CHECKED FOR CLOTS PLATELET COUNT CONFIRMED BY SMEAR   Renal function panel     Status: Abnormal   Collection Time: 09/14/16  4:59 AM  Result Value Ref Range   Sodium 136 135 - 145 mmol/L   Potassium 3.9 3.5 - 5.1 mmol/L   Chloride 93 (L) 101 - 111 mmol/L   CO2 26 22 - 32 mmol/L   Glucose, Bld 134 (H) 65 - 99 mg/dL   BUN 50 (H) 6 - 20 mg/dL   Creatinine, Ser 9.70 (H) 0.61 - 1.24 mg/dL   Calcium 8.8 (L) 8.9 - 10.3 mg/dL   Phosphorus 8.2 (H) 2.5 - 4.6 mg/dL   Albumin 4.0 3.5 - 5.0 g/dL   GFR calc non Af Amer 5 (L) >60 mL/min   GFR calc Af Amer 6 (L) >60 mL/min    Comment: (NOTE) The eGFR has been calculated using the CKD EPI equation. This calculation has not been validated in all clinical situations. eGFR's persistently <60 mL/min signify possible Chronic Kidney Disease.    Anion gap 17 (H) 5 - 15    ABGS  Recent Labs  09/13/16 0035  PHART 7.396  PO2ART 80.3*  HCO3 24.7   CULTURES Recent Results (from the past 240 hour(s))  MRSA PCR  Screening     Status: None   Collection Time: 09/13/16  4:07 AM  Result Value Ref Range Status   MRSA by PCR NEGATIVE NEGATIVE Final    Comment:        The GeneXpert MRSA Assay (FDA approved for NASAL specimens only), is one component of a comprehensive MRSA colonization surveillance program. It is not intended to diagnose MRSA infection nor to guide or monitor treatment for MRSA infections.    Studies/Results: Dg Chest Port 1 View  Result Date: 09/13/2016 CLINICAL DATA:  Shortness of breath  since dialysis today. EXAM: PORTABLE CHEST 1 VIEW COMPARISON:  Frontal and lateral views 03/12/2016 FINDINGS: Mild cardiomegaly, new. Mediastinal contours are unchanged. Central pulmonary vascular engorgement. No confluent airspace disease. No pleural fluid or pneumothorax. Surgical hardware in the lower cervical spine is partially included. IMPRESSION: Cardiomegaly and vascular congestion. Electronically Signed   By: Jeb Levering M.D.   On: 09/13/2016 00:05    Medications:  Prior to Admission:  Facility-Administered Medications Prior to Admission  Medication Dose Route Frequency Provider Last Rate Last Dose  . 0.9 %  sodium chloride infusion  500 mL Intravenous Continuous Milus Banister, MD       Prescriptions Prior to Admission  Medication Sig Dispense Refill Last Dose  . acetaminophen (TYLENOL) 500 MG tablet Take 1 tablet (500 mg total) by mouth every 6 (six) hours as needed for moderate pain.   09/12/2016 at Unknown time  . amLODipine (NORVASC) 10 MG tablet One twice daily to control BP 180 tablet 3 09/12/2016 at Unknown time  . aspirin EC 81 MG tablet Take 81 mg by mouth daily.   09/12/2016 at Unknown time  . b complex-vitamin c-folic acid (NEPHRO-VITE) 0.8 MG TABS tablet TAKE 1 TABLET BY MOUTH EVERY DAY 90 tablet 1 09/12/2016 at Unknown time  . budesonide-formoterol (SYMBICORT) 80-4.5 MCG/ACT inhaler Inhale 2 puffs into the lungs 2 (two) times daily. 1 Inhaler 1 unknown  . calcitRIOL  (ROCALTROL) 0.25 MCG capsule Take 1 capsule (0.25 mcg total) by mouth every Monday, Wednesday, and Friday with hemodialysis. 30 capsule 1 09/12/2016 at Unknown time  . Cholecalciferol (VITAMIN D3) 5000 UNITS TABS Take 1 tablet by mouth daily.   09/12/2016 at Unknown time  . dextromethorphan-guaiFENesin (MUCINEX DM) 30-600 MG 12hr tablet Take 1 tablet by mouth 2 (two) times daily.   09/12/2016 at Unknown time  . diphenhydrAMINE (BENADRYL) 25 MG tablet Take 50 mg by mouth 2 (two) times daily as needed for allergies.    09/12/2016 at Unknown time  . guaiFENesin-dextromethorphan (ROBITUSSIN DM) 100-10 MG/5ML syrup Take 10 mLs by mouth every 4 (four) hours as needed for cough.   09/12/2016 at Unknown time  . hydrALAZINE (APRESOLINE) 25 MG tablet One twice daily to regulate BP (Patient taking differently: Take 25 mg by mouth 3 (three) times daily. One twice daily to regulate BP) 180 tablet 3 09/12/2016 at Unknown time  . ipratropium-albuterol (DUONEB) 0.5-2.5 (3) MG/3ML SOLN Take 3 mLs by nebulization 3 (three) times daily. ICD 10 code J44.9 360 mL 0 09/12/2016 at Unknown time  . metoCLOPramide (REGLAN) 10 MG tablet One before each meal to increase stomach emptying (Patient taking differently: Take 10 mg by mouth 3 (three) times daily before meals. One before each meal to increase stomach emptying) 90 tablet 5 09/12/2016 at Unknown time  . metoprolol tartrate (LOPRESSOR) 50 MG tablet One twice daily to regulate BP 180 tablet 3 09/12/2016 at 2215  . omeprazole (PRILOSEC) 20 MG capsule TAKE 1 CAPSULE (20 MG TOTAL) BY MOUTH 2 (TWO) TIMES DAILY BEFORE A MEAL. 60 capsule 0 09/12/2016 at Unknown time  . pravastatin (PRAVACHOL) 40 MG tablet Take 1 tablet (40 mg total) by mouth every evening. 90 tablet 1 09/12/2016 at Unknown time  . promethazine (PHENERGAN) 25 MG tablet TAKE 1 TABLET BY MOUTH EVERY 8 HOURS AS NEEDED FOR NAUSEA AND VOMITING.. CAN FILL 08/10 90 tablet 1 unknown at Unknown time  . traMADol (ULTRAM) 50 MG tablet  One up to 4 times daly if needed for pain (Patient taking  differently: Take 50 mg by mouth 4 (four) times daily as needed for moderate pain. One up to 4 times daly if needed for pain) 100 tablet 3 unknown   Scheduled: . amLODipine  10 mg Oral Daily  . aspirin EC  81 mg Oral Daily  . calcitRIOL  0.25 mcg Oral Q M,W,F-HD  . cholecalciferol  5,000 Units Oral Daily  . heparin  5,000 Units Subcutaneous Q8H  . hydrALAZINE  50 mg Oral Q8H  . ipratropium-albuterol  3 mL Nebulization TID  . metoprolol tartrate  50 mg Oral BID  . mometasone-formoterol  2 puff Inhalation BID  . nitroGLYCERIN  1 inch Topical Q6H  . pantoprazole  40 mg Oral Daily  . pravastatin  40 mg Oral QPM  . sodium chloride flush  3 mL Intravenous Q12H   Continuous: . sodium chloride    . sodium chloride    . sodium chloride     DVV:OHYWVP chloride, sodium chloride, sodium chloride, alteplase, diphenhydrAMINE, guaiFENesin-dextromethorphan, heparin, lidocaine (PF), lidocaine-prilocaine, ondansetron **OR** ondansetron (ZOFRAN) IV, pentafluoroprop-tetrafluoroeth, sodium chloride flush, traMADol  Assesment: He was admitted with pulmonary edema. He has end-stage renal disease on dialysis and had dialysis yesterday. He's going to undergo dialysis again today. He still has respiratory failure requiring BiPAP and he is still high risk of requiring intubation and mechanical ventilation. He did not tolerate coming off BiPAP yesterday Active Problems:   OSA on CPAP   ESRD on dialysis [facility])   Pulmonary edema    Plan: Continue treatments chest x-ray today    LOS: 1 day   Imelda Dandridge L 09/14/2016, 8:20 AM

## 2016-09-14 NOTE — Progress Notes (Signed)
**Note De-Identified  Obfuscation** Patient removed himself from BIPAP.  RT placed on 6 L [place] and will continue to monitor.

## 2016-09-14 NOTE — Progress Notes (Signed)
Patient asleep off BiPAP resting with out problem will place on Machine if he needs.

## 2016-09-14 NOTE — Progress Notes (Signed)
PROGRESS NOTE    Kirk [name]  OVF:[id number] DOB: [birth year] DOA: 09/12/2016 PCP: Gayland Curry, DO     Brief Narrative:  56 year old man admitted to the hospital on 8/23 due to shortness of breath. He has a history of asthma but was also found to have pulmonary vascular congestion. He has end-stage renal disease.   Assessment & Plan:   Active Problems:   OSA on CPAP   ESRD on dialysis [facility])   Pulmonary edema   Acute hypoxemic respiratory failure -Due to pulmonary edema plus minus asthma component in a patient who has end-stage renal disease on hemodialysis. -We have been unable to wean him off BiPAP for the past 2 days. -Chest x-ray continues to show pulmonary vascular congestion. -Received dialysis yesterday and will receive another session today. -Given moderate amount of wheezing today we'll go ahead and add Solu-Medrol. -Continue 3 times daily duonebs.  End-stage renal disease -On hemodialysis -Nephrology following.  Hypertension -Fair control, continue amlodipine, metoprolol, hydralazine   DVT prophylaxis: Subcutaneous heparin Code Status: Full code Family Communication: Family members at bedside updated on plan of care Disposition Plan: Keep in ICU  Consultants:   Pulmonary, Dr. Luan Pulling  Procedures:   None  Antimicrobials:  Anti-infectives    None       Subjective: Still with increased work of breathing, unable to wean BiPAP, denies chest pain  Objective: Vitals:   09/14/16 1400 09/14/16 1413 09/14/16 1500 09/14/16 1600  BP: 130/80  111/79 133/75  Pulse: 67  71 72  Resp: 20 (!) 24 (!) 22 19  Temp:      TempSrc:      SpO2: 96%  93% 93%  Weight:      Height:        Intake/Output Summary (Last 24 hours) at 09/14/16 1645 Last data filed at 09/13/16 2230  Gross per 24 hour  Intake              490 ml  Output                0 ml  Net              490 ml   Filed Weights   09/13/16 0845 09/13/16 1725 09/14/16 1315  Weight: 103 kg  (227 lb 1.2 oz) 103 kg (227 lb 1.2 oz) 103 kg (227 lb 1.2 oz)    Examination:  General exam: Alert, awake, oriented x 3 Respiratory system: Coarse bilateral breath sounds with bilateral wheezing Cardiovascular system:RRR. No murmurs, rubs, gallops. Gastrointestinal system: Abdomen is nondistended, soft and nontender. No organomegaly or masses felt. Normal bowel sounds heard. Central nervous system: Alert and oriented. No focal neurological deficits. Extremities: No C/C/E, +pedal pulses Skin: No rashes, lesions or ulcers Psychiatry: Judgement and insight appear normal. Mood & affect appropriate.     Data Reviewed: I have personally reviewed following labs and imaging studies  CBC:  Recent Labs Lab 09/12/16 2338 09/13/16 0416 09/14/16 0459  WBC 10.0 9.8 6.0  NEUTROABS 8.9*  --   --   HGB 11.2* 10.2* 10.8*  HCT 34.6* 31.1* 33.4*  MCV 97.5 96.9 98.2  PLT 117* 105* 841*   Basic Metabolic Panel:  Recent Labs Lab 09/12/16 2338 09/13/16 0416 09/14/16 0459  NA 139 137 136  K 3.4* 3.7 3.9  CL 98* 97* 93*  CO2 25 24 26   GLUCOSE 172* 184* 134*  BUN 43* 50* 50*  CREATININE 8.57* 9.23* 9.70*  CALCIUM 8.8* 8.6* 8.8*  PHOS  --   --  8.2*   GFR: Estimated Creatinine Clearance: 10 mL/min (A) (by C-G formula based on SCr of 9.7 mg/dL (H)). Liver Function Tests:  Recent Labs Lab 09/12/16 2338 09/13/16 0416 09/14/16 0459  AST 21 22  --   ALT 18 18  --   ALKPHOS 76 71  --   BILITOT 0.9 1.0  --   PROT 8.0 7.4  --   ALBUMIN 4.2 3.9 4.0   No results for input(s): LIPASE, AMYLASE in the last 168 hours. No results for input(s): AMMONIA in the last 168 hours. Coagulation Profile: No results for input(s): INR, PROTIME in the last 168 hours. Cardiac Enzymes:  Recent Labs Lab 09/12/16 2338 09/13/16 0416 09/13/16 0939 09/13/16 1538  TROPONINI <0.03 <0.03 <0.03 <0.03   BNP (last 3 results) No results for input(s): PROBNP in the last 8760 hours. HbA1C: No results for  input(s): HGBA1C in the last 72 hours. CBG: No results for input(s): GLUCAP in the last 168 hours. Lipid Profile: No results for input(s): CHOL, HDL, LDLCALC, TRIG, CHOLHDL, LDLDIRECT in the last 72 hours. Thyroid Function Tests: No results for input(s): TSH, T4TOTAL, FREET4, T3FREE, THYROIDAB in the last 72 hours. Anemia Panel: No results for input(s): VITAMINB12, FOLATE, FERRITIN, TIBC, IRON, RETICCTPCT in the last 72 hours. Urine analysis:    Component Value Date/Time   COLORURINE YELLOW 04/06/2014 1030   APPEARANCEUR CLEAR 04/06/2014 1030   LABSPEC 1.020 04/06/2014 1030   PHURINE 8.0 04/06/2014 1030   GLUCOSEU 100 (A) 04/06/2014 1030   HGBUR MODERATE (A) 04/06/2014 1030   BILIRUBINUR NEGATIVE 04/06/2014 1030   KETONESUR NEGATIVE 04/06/2014 1030   PROTEINUR >300 (A) 04/06/2014 1030   UROBILINOGEN 0.2 04/06/2014 1030   NITRITE NEGATIVE 04/06/2014 1030   LEUKOCYTESUR NEGATIVE 04/06/2014 1030   Sepsis Labs: @LABRCNTIP (procalcitonin:4,lacticidven:4)  ) Recent Results (from the past 240 hour(s))  MRSA PCR Screening     Status: None   Collection Time: 09/13/16  4:07 AM  Result Value Ref Range Status   MRSA by PCR NEGATIVE NEGATIVE Final    Comment:        The GeneXpert MRSA Assay (FDA approved for NASAL specimens only), is one component of a comprehensive MRSA colonization surveillance program. It is not intended to diagnose MRSA infection nor to guide or monitor treatment for MRSA infections.          Radiology Studies: Dg Chest Port 1 View  Result Date: 09/14/2016 CLINICAL DATA:  Respiratory failure EXAM: PORTABLE CHEST 1 VIEW COMPARISON:  September 12, 2016 FINDINGS: There is no edema or consolidation. There is stable cardiomegaly with pulmonary venous hypertension. No adenopathy. There is postoperative change in the lower cervical spine. IMPRESSION: Persistent pulmonary vascular congestion without edema or consolidation. Electronically Signed   By: Lowella Grip III M.D.   On: 09/14/2016 10:36   Dg Chest Port 1 View  Result Date: 09/13/2016 CLINICAL DATA:  Shortness of breath since dialysis today. EXAM: PORTABLE CHEST 1 VIEW COMPARISON:  Frontal and lateral views 03/12/2016 FINDINGS: Mild cardiomegaly, new. Mediastinal contours are unchanged. Central pulmonary vascular engorgement. No confluent airspace disease. No pleural fluid or pneumothorax. Surgical hardware in the lower cervical spine is partially included. IMPRESSION: Cardiomegaly and vascular congestion. Electronically Signed   By: Jeb Levering M.D.   On: 09/13/2016 00:05        Scheduled Meds: . amLODipine  10 mg Oral Daily  . aspirin EC  81 mg Oral Daily  . calcitRIOL  0.25 mcg Oral Q M,W,F-HD  . cholecalciferol  5,000 Units Oral Daily  . epoetin (EPOGEN/PROCRIT) injection  1,000 Units Intravenous Q M,W,F-HD  . heparin  5,000 Units Subcutaneous Q8H  . hydrALAZINE  50 mg Oral Q8H  . ipratropium-albuterol  3 mL Nebulization TID  . metoprolol tartrate  50 mg Oral BID  . mometasone-formoterol  2 puff Inhalation BID  . nitroGLYCERIN  1 inch Topical Q6H  . pantoprazole  40 mg Oral Daily  . pravastatin  40 mg Oral QPM  . sevelamer carbonate  800 mg Oral TID WC  . sodium chloride flush  3 mL Intravenous Q12H   Continuous Infusions: . sodium chloride    . sodium chloride    . sodium chloride    . sodium chloride    . sodium chloride       LOS: 1 day    Time spent: 30 minutes. Greater than 50% of this time was spent in direct contact with the patient coordinating care.     Lelon Frohlich, MD [facility] Pager [phone]  If 7PM-7AM, please contact night-coverage [email or web address] Password [facility] 09/14/2016, 4:45 PM

## 2016-09-15 DIAGNOSIS — J9601 Acute respiratory failure with hypoxia: Secondary | ICD-10-CM

## 2016-09-15 MED ORDER — LORAZEPAM 2 MG/ML IJ SOLN
0.5000 mg | Freq: Once | INTRAMUSCULAR | Status: AC
  Administered 2016-09-15: 0.5 mg via INTRAVENOUS
  Filled 2016-09-15: qty 1

## 2016-09-15 MED ORDER — GUAIFENESIN ER 600 MG PO TB12
1200.0000 mg | ORAL_TABLET | Freq: Two times a day (BID) | ORAL | Status: DC
  Administered 2016-09-15 – 2016-09-22 (×15): 1200 mg via ORAL
  Filled 2016-09-15 (×15): qty 2

## 2016-09-15 MED ORDER — LORAZEPAM BOLUS VIA INFUSION: 0.5000 mg | Freq: Once | INTRAVENOUS | Status: DC

## 2016-09-15 NOTE — Progress Notes (Signed)
Kirk [name]  MRN: [id number]  DOB/AGE: [birth year] 54 y.o.  Primary Care Physician:Reed, Tiffany L, DO  Admit date: 09/12/2016  Chief Complaint:  Chief Complaint  Patient presents with  . Shortness of Breath    S-Pt presented on  09/12/2016 with  Chief Complaint  Patient presents with  . Shortness of Breath  . Pt says " I feel better than yesterday"   meds . amLODipine  10 mg Oral Daily  . aspirin EC  81 mg Oral Daily  . calcitRIOL  0.25 mcg Oral Q M,W,F-HD  . cholecalciferol  5,000 Units Oral Daily  . epoetin (EPOGEN/PROCRIT) injection  1,000 Units Intravenous Q M,W,F-HD  . guaiFENesin  1,200 mg Oral BID  . heparin  5,000 Units Subcutaneous Q8H  . hydrALAZINE  50 mg Oral Q8H  . ipratropium-albuterol  3 mL Nebulization TID  . methylPREDNISolone (SOLU-MEDROL) injection  60 mg Intravenous Q6H  . metoprolol tartrate  50 mg Oral BID  . mometasone-formoterol  2 puff Inhalation BID  . pantoprazole  40 mg Oral Daily  . pravastatin  40 mg Oral QPM  . sevelamer carbonate  800 mg Oral TID WC  . sodium chloride flush  3 mL Intravenous Q12H       Physical Exam: Vital signs in last 24 hours: Temp:  [97.7 F (36.5 C)-99 F (37.2 C)] 97.8 F (36.6 C) (08/25 1159) Pulse Rate:  [67-93] 87 (08/25 1300) Resp:  [17-25] 24 (08/25 1300) BP: (111-187)/(57-123) 171/67 (08/25 1300) SpO2:  [90 %-99 %] 92 % (08/25 1300) FiO2 (%):  [50 %] 50 % (08/25 0823) Weight change: 0 lb (0 kg) Last BM Date: 09/14/16  Intake/Output from previous day: 08/24 0701 - 08/25 0700 In: 240 [P.O.:240] Out: 2200  Total I/O In: 560 [P.O.:560] Out: -    Physical Exam: General- pt is awake,alert, oriented to time place and person Resp- No acute REsp distress,  Rhonchi( better)  CVS- S1S2 regular ij rate and rhythm GIT- BS+, soft, NT, ND EXT- NO LE Edema, Cyanosis Access- left AVF  Lab Results: CBC  Recent Labs  09/13/16 0416 09/14/16 0459  WBC 9.8 6.0  HGB 10.2* 10.8*  HCT 31.1* 33.4*   PLT 105* 119*    BMET  Recent Labs  09/13/16 0416 09/14/16 0459  NA 137 136  K 3.7 3.9  CL 97* 93*  CO2 24 26  GLUCOSE 184* 134*  BUN 50* 50*  CREATININE 9.23* 9.70*  CALCIUM 8.6* 8.8*    MICRO Recent Results (from the past 240 hour(s))  MRSA PCR Screening     Status: None   Collection Time: 09/13/16  4:07 AM  Result Value Ref Range Status   MRSA by PCR NEGATIVE NEGATIVE Final    Comment:        The GeneXpert MRSA Assay (FDA approved for NASAL specimens only), is one component of a comprehensive MRSA colonization surveillance program. It is not intended to diagnose MRSA infection nor to guide or monitor treatment for MRSA infections.       Lab Results  Component Value Date   PTH 429 04/30/2015   CALCIUM 8.8 (L) 09/14/2016   CAION 1.02 (L) 09/08/2013   PHOS 8.2 (H) 09/14/2016          Impression: 1)Renal  ESRD on Hd               Pt is On Monday/Wednesday/Friday schedule               Pt  was dialyze yesterday               Hx of failed transplant  2)HTN  Medication- On Calcium Channel Blockers On Alpha and beta Blockers   3)Anemia In ESRD the goal for hgb ia 9--11    Pt hgb is at goal   4)CKD Mineral-Bone Disorder Secondary Hyperparathyroidism present  Phosphorus not at goal.   5)Resp  admitted with pulmonary edema Now better Primary MD following  6)Electrolytes Normokalemic NOrmonatremic   7)Acid base Co2 at goal      Plan:  No need of HD today Will continue current care.      [place]eno S 09/15/2016, 2:14 PM

## 2016-09-15 NOTE — Progress Notes (Signed)
Patient ambulated around nursing station/ unit with assistance from nurse tech without oxygen. Oxygen saturation did not drop below 88-89% during ambulation. Upon return to room Patient placed back on monitor and nasal cannula. HR 90 O2 sats 90.

## 2016-09-15 NOTE — Progress Notes (Signed)
PROGRESS NOTE    Kirk [name]  LOV:[id number] DOB: 07/23/1960 DOA: 09/12/2016 PCP: Gayland Curry, DO     Brief Narrative:  56 year old man admitted to the hospital on 8/23 due to shortness of breath. He has a history of asthma but was also found to have pulmonary vascular congestion. He has end-stage renal disease.   Assessment & Plan:   Active Problems:   OSA on CPAP   ESRD on dialysis [facility])   Pulmonary edema   Acute hypoxemic respiratory failure -Due to pulmonary edema plus minus asthma component in a patient who has end-stage renal disease on hemodialysis. -We are working on weaning him off BiPAP, anxiety plays a huge role into why this has been difficult. -Repeat chest x-ray in a.m. -Has received dialysis on 3 consecutive days. -Solu-Medrol was added on 8/24. Less wheezing on exam today. -Continue 3 times daily duonebs.  End-stage renal disease -On hemodialysis -Nephrology following.  Hypertension -Fair control, continue amlodipine, metoprolol, hydralazine   DVT prophylaxis: Subcutaneous heparin Code Status: Full code Family Communication: Family members at bedside updated on plan of care Disposition Plan: Keep in ICU  Consultants:   Pulmonary, Dr. Luan Pulling  Procedures:   None  Antimicrobials:  Anti-infectives    None       Subjective: Still complains of shortness of breath, he has been intermittently using BiPAP. He states at times he gets a panic attack that causes his breathing to get worse.  Objective: Vitals:   09/15/16 0500 09/15/16 0600 09/15/16 0800 09/15/16 0823  BP: (!) 148/65 (!) 159/72    Pulse: 86 88    Resp: (!) 22 (!) 25    Temp:   97.7 F (36.5 C)   TempSrc:   Rectal   SpO2: 93% 95%  99%  Weight:      Height:        Intake/Output Summary (Last 24 hours) at 09/15/16 1121 Last data filed at 09/14/16 2300  Gross per 24 hour  Intake              240 ml  Output             2200 ml  Net            -1960 ml   Filed  Weights   09/13/16 0845 09/13/16 1725 09/14/16 1315  Weight: 103 kg (227 lb 1.2 oz) 103 kg (227 lb 1.2 oz) 103 kg (227 lb 1.2 oz)    Examination:  General exam: Alert, awake, oriented x 3 Respiratory system: Coarse bilateral breath sounds, minimal wheezing Cardiovascular system:RRR. No murmurs, rubs, gallops. Gastrointestinal system: Abdomen is nondistended, soft and nontender. No organomegaly or masses felt. Normal bowel sounds heard. Central nervous system: Alert and oriented. No focal neurological deficits. Extremities: 1+ pitting edema Skin: No rashes, lesions or ulcers Psychiatry: Judgement and insight appear normal. Mood & affect appropriate.     Data Reviewed: I have personally reviewed following labs and imaging studies  CBC:  Recent Labs Lab 09/12/16 2338 09/13/16 0416 09/14/16 0459  WBC 10.0 9.8 6.0  NEUTROABS 8.9*  --   --   HGB 11.2* 10.2* 10.8*  HCT 34.6* 31.1* 33.4*  MCV 97.5 96.9 98.2  PLT 117* 105* 884*   Basic Metabolic Panel:  Recent Labs Lab 09/12/16 2338 09/13/16 0416 09/14/16 0459  NA 139 137 136  K 3.4* 3.7 3.9  CL 98* 97* 93*  CO2 25 24 26   GLUCOSE 172* 184* 134*  BUN 43* 50* 50*  CREATININE 8.57* 9.23* 9.70*  CALCIUM 8.8* 8.6* 8.8*  PHOS  --   --  8.2*   GFR: Estimated Creatinine Clearance: 10 mL/min (A) (by C-G formula based on SCr of 9.7 mg/dL (H)). Liver Function Tests:  Recent Labs Lab 09/12/16 2338 09/13/16 0416 09/14/16 0459  AST 21 22  --   ALT 18 18  --   ALKPHOS 76 71  --   BILITOT 0.9 1.0  --   PROT 8.0 7.4  --   ALBUMIN 4.2 3.9 4.0   No results for input(s): LIPASE, AMYLASE in the last 168 hours. No results for input(s): AMMONIA in the last 168 hours. Coagulation Profile: No results for input(s): INR, PROTIME in the last 168 hours. Cardiac Enzymes:  Recent Labs Lab 09/12/16 2338 09/13/16 0416 09/13/16 0939 09/13/16 1538  TROPONINI <0.03 <0.03 <0.03 <0.03   BNP (last 3 results) No results for input(s):  PROBNP in the last 8760 hours. HbA1C: No results for input(s): HGBA1C in the last 72 hours. CBG: No results for input(s): GLUCAP in the last 168 hours. Lipid Profile: No results for input(s): CHOL, HDL, LDLCALC, TRIG, CHOLHDL, LDLDIRECT in the last 72 hours. Thyroid Function Tests: No results for input(s): TSH, T4TOTAL, FREET4, T3FREE, THYROIDAB in the last 72 hours. Anemia Panel: No results for input(s): VITAMINB12, FOLATE, FERRITIN, TIBC, IRON, RETICCTPCT in the last 72 hours. Urine analysis:    Component Value Date/Time   COLORURINE YELLOW 04/06/2014 1030   APPEARANCEUR CLEAR 04/06/2014 1030   LABSPEC 1.020 04/06/2014 1030   PHURINE 8.0 04/06/2014 1030   GLUCOSEU 100 (A) 04/06/2014 1030   HGBUR MODERATE (A) 04/06/2014 1030   BILIRUBINUR NEGATIVE 04/06/2014 1030   KETONESUR NEGATIVE 04/06/2014 1030   PROTEINUR >300 (A) 04/06/2014 1030   UROBILINOGEN 0.2 04/06/2014 1030   NITRITE NEGATIVE 04/06/2014 1030   LEUKOCYTESUR NEGATIVE 04/06/2014 1030   Sepsis Labs: @LABRCNTIP (procalcitonin:4,lacticidven:4)  ) Recent Results (from the past 240 hour(s))  MRSA PCR Screening     Status: None   Collection Time: 09/13/16  4:07 AM  Result Value Ref Range Status   MRSA by PCR NEGATIVE NEGATIVE Final    Comment:        The GeneXpert MRSA Assay (FDA approved for NASAL specimens only), is one component of a comprehensive MRSA colonization surveillance program. It is not intended to diagnose MRSA infection nor to guide or monitor treatment for MRSA infections.          Radiology Studies: Dg Chest Port 1 View  Result Date: 09/14/2016 CLINICAL DATA:  Respiratory failure EXAM: PORTABLE CHEST 1 VIEW COMPARISON:  September 12, 2016 FINDINGS: There is no edema or consolidation. There is stable cardiomegaly with pulmonary venous hypertension. No adenopathy. There is postoperative change in the lower cervical spine. IMPRESSION: Persistent pulmonary vascular congestion without edema or  consolidation. Electronically Signed   By: Lowella Grip III M.D.   On: 09/14/2016 10:36        Scheduled Meds: . amLODipine  10 mg Oral Daily  . aspirin EC  81 mg Oral Daily  . calcitRIOL  0.25 mcg Oral Q M,W,F-HD  . cholecalciferol  5,000 Units Oral Daily  . epoetin (EPOGEN/PROCRIT) injection  1,000 Units Intravenous Q M,W,F-HD  . guaiFENesin  1,200 mg Oral BID  . heparin  5,000 Units Subcutaneous Q8H  . hydrALAZINE  50 mg Oral Q8H  . ipratropium-albuterol  3 mL Nebulization TID  . methylPREDNISolone (SOLU-MEDROL) injection  60 mg Intravenous Q6H  . metoprolol tartrate  50  mg Oral BID  . mometasone-formoterol  2 puff Inhalation BID  . pantoprazole  40 mg Oral Daily  . pravastatin  40 mg Oral QPM  . sevelamer carbonate  800 mg Oral TID WC  . sodium chloride flush  3 mL Intravenous Q12H   Continuous Infusions: . sodium chloride    . sodium chloride    . sodium chloride    . sodium chloride    . sodium chloride       LOS: 2 days    Time spent: 30 minutes. Greater than 50% of this time was spent in direct contact with the patient coordinating care.     Lelon Frohlich, MD [facility] Pager [phone]  If 7PM-7AM, please contact night-coverage [email or web address] Password [email or web address] 09/15/2016, 11:21 AM

## 2016-09-16 ENCOUNTER — Inpatient Hospital Stay (HOSPITAL_COMMUNITY): Payer: Medicare Other

## 2016-09-16 LAB — CBC
HEMATOCRIT: 34.4 % — AB (ref 39.0–52.0)
Hemoglobin: 11.2 g/dL — ABNORMAL LOW (ref 13.0–17.0)
MCH: 31.5 pg (ref 26.0–34.0)
MCHC: 32.6 g/dL (ref 30.0–36.0)
MCV: 96.9 fL (ref 78.0–100.0)
PLATELETS: 155 10*3/uL (ref 150–400)
RBC: 3.55 MIL/uL — ABNORMAL LOW (ref 4.22–5.81)
RDW: 15.4 % (ref 11.5–15.5)
WBC: 9.6 10*3/uL (ref 4.0–10.5)

## 2016-09-16 LAB — BASIC METABOLIC PANEL
Anion gap: 19 — ABNORMAL HIGH (ref 5–15)
BUN: 83 mg/dL — AB (ref 6–20)
CO2: 23 mmol/L (ref 22–32)
Calcium: 9.4 mg/dL (ref 8.9–10.3)
Chloride: 93 mmol/L — ABNORMAL LOW (ref 101–111)
Creatinine, Ser: 12.53 mg/dL — ABNORMAL HIGH (ref 0.61–1.24)
GFR calc Af Amer: 5 mL/min — ABNORMAL LOW (ref >60)
GFR, EST NON AFRICAN AMERICAN: 4 mL/min — AB (ref >60)
Glucose, Bld: 180 mg/dL — ABNORMAL HIGH (ref 65–99)
POTASSIUM: 4.8 mmol/L (ref 3.5–5.1)
SODIUM: 135 mmol/L (ref 135–145)

## 2016-09-16 NOTE — Progress Notes (Signed)
Patient during much better turned BiPAP down to 35 Fi02 , patient placed on himself at bedtime checked By rt later. Will change out for dream station tomorrow.

## 2016-09-16 NOTE — Progress Notes (Signed)
Kirk [name]  MRN: [id number]  DOB/AGE: [birth year] 55 y.o.  Primary Care Physician:Reed, Tiffany L, DO  Admit date: 09/12/2016  Chief Complaint:  Chief Complaint  Patient presents with  . Shortness of Breath    S-Pt presented on  09/12/2016 with  Chief Complaint  Patient presents with  . Shortness of Breath  . Pt offers no new complaints.   meds . amLODipine  10 mg Oral Daily  . aspirin EC  81 mg Oral Daily  . calcitRIOL  0.25 mcg Oral Q M,W,F-HD  . cholecalciferol  5,000 Units Oral Daily  . epoetin (EPOGEN/PROCRIT) injection  1,000 Units Intravenous Q M,W,F-HD  . guaiFENesin  1,200 mg Oral BID  . heparin  5,000 Units Subcutaneous Q8H  . hydrALAZINE  50 mg Oral Q8H  . ipratropium-albuterol  3 mL Nebulization TID  . methylPREDNISolone (SOLU-MEDROL) injection  60 mg Intravenous Q6H  . metoprolol tartrate  50 mg Oral BID  . mometasone-formoterol  2 puff Inhalation BID  . pantoprazole  40 mg Oral Daily  . pravastatin  40 mg Oral QPM  . sevelamer carbonate  800 mg Oral TID WC  . sodium chloride flush  3 mL Intravenous Q12H       Physical Exam: Vital signs in last 24 hours: Temp:  [97.5 F (36.4 C)-99 F (37.2 C)] 97.5 F (36.4 C) (08/26 1152) Pulse Rate:  [71-94] 71 (08/26 1100) Resp:  [16-25] 20 (08/26 1100) BP: (141-198)/(65-130) 176/72 (08/26 1319) SpO2:  [89 %-100 %] 96 % (08/26 1100) Weight:  [226 lb 3.1 oz (102.6 kg)] 226 lb 3.1 oz (102.6 kg) (08/26 0500) Weight change: -14.1 oz (-0.4 kg) Last BM Date: 09/14/16  Intake/Output from previous day: 08/25 0701 - 08/26 0700 In: 560 [P.O.:560] Out: 25 [Urine:25] Total I/O In: 483 [P.O.:480; I.V.:3] Out: -    Physical Exam: General- pt is awake,alert, oriented to time place and person Resp- No acute REsp distress,  Wheezing   CVS- S1S2 regular ij rate and rhythm GIT- BS+, soft, NT, ND EXT- NO LE Edema, Cyanosis Access- left AVF  Lab Results: CBC  Recent Labs  09/14/16 0459 09/16/16 0336  WBC  6.0 9.6  HGB 10.8* 11.2*  HCT 33.4* 34.4*  PLT 119* 155    BMET  Recent Labs  09/14/16 0459 09/16/16 0336  NA 136 135  K 3.9 4.8  CL 93* 93*  CO2 26 23  GLUCOSE 134* 180*  BUN 50* 83*  CREATININE 9.70* 12.53*  CALCIUM 8.8* 9.4    MICRO Recent Results (from the past 240 hour(s))  MRSA PCR Screening     Status: None   Collection Time: 09/13/16  4:07 AM  Result Value Ref Range Status   MRSA by PCR NEGATIVE NEGATIVE Final    Comment:        The GeneXpert MRSA Assay (FDA approved for NASAL specimens only), is one component of a comprehensive MRSA colonization surveillance program. It is not intended to diagnose MRSA infection nor to guide or monitor treatment for MRSA infections.       Lab Results  Component Value Date   PTH 429 04/30/2015   CALCIUM 9.4 09/16/2016   CAION 1.02 (L) 09/08/2013   PHOS 8.2 (H) 09/14/2016       Impression: 1)Renal  ESRD on Hd               Pt is On Monday/Wednesday/Friday schedule  Pt was dialyze yesterday               Hx of failed transplant  2)HTN  Medication- On Calcium Channel Blockers On Alpha and beta Blockers   3)Anemia In ESRD the goal for hgb ia 9--11    Pt hgb is at goal   No need of epo as hgb more than 11  4)CKD Mineral-Bone Disorder Secondary Hyperparathyroidism present  Phosphorus not at goal.   5)Resp  admitted with pulmonary edema Now better Primary MD following  6)Electrolytes Normokalemic NOrmonatremic   7)Acid base Co2 at goal      Plan:  No need of HD today Will continue current care. Will dialyze in am.     Joby Richart S 09/16/2016, 4:39 PM

## 2016-09-16 NOTE — Progress Notes (Signed)
Traded patient to dream station for sleep same BiPAP settings 14/6   4 liter oxygen bled in to circuit.

## 2016-09-16 NOTE — Progress Notes (Signed)
Subjective: He did use BiPAP last night but he generally looks much better. He was able to ambulate in the hall little bit yesterday. He's not as short of breath. He is still coughing mostly nonproductively.  Objective: Vital signs in last 24 hours: Temp:  [97.7 F (36.5 C)-99 F (37.2 C)] 98.1 F (36.7 C) (08/26 0400) Pulse Rate:  [75-94] 75 (08/26 0236) Resp:  [16-29] 25 (08/26 0236) BP: (144-198)/(65-130) 172/84 (08/26 0200) SpO2:  [89 %-100 %] 99 % (08/26 0236) FiO2 (%):  [50 %] 50 % (08/25 0823) Weight:  [102.6 kg (226 lb 3.1 oz)] 102.6 kg (226 lb 3.1 oz) (08/26 0500) Weight change: -0.4 kg (-14.1 oz) Last BM Date: 09/14/16  Intake/Output from previous day: 08/25 0701 - 08/26 0700 In: 560 [P.O.:560] Out: 25 [Urine:25]  PHYSICAL EXAM General appearance: alert, cooperative and no distress Resp: clear to auscultation bilaterally Cardio: regular rate and rhythm, S1, S2 normal, no murmur, click, rub or gallop GI: soft, non-tender; bowel sounds normal; no masses,  no organomegaly Extremities: extremities normal, atraumatic, no cyanosis or edema Skin warm and dry  Lab Results:  Results for orders placed or performed during the hospital encounter of 09/12/16 (from the past 48 hour(s))  Basic metabolic panel     Status: Abnormal   Collection Time: 09/16/16  3:36 AM  Result Value Ref Range   Sodium 135 135 - 145 mmol/L   Potassium 4.8 3.5 - 5.1 mmol/L    Comment: DELTA CHECK NOTED   Chloride 93 (L) 101 - 111 mmol/L   CO2 23 22 - 32 mmol/L   Glucose, Bld 180 (H) 65 - 99 mg/dL   BUN 83 (H) 6 - 20 mg/dL   Creatinine, Ser 12.53 (H) 0.61 - 1.24 mg/dL   Calcium 9.4 8.9 - 10.3 mg/dL   GFR calc non Af Amer 4 (L) >60 mL/min   GFR calc Af Amer 5 (L) >60 mL/min    Comment: (NOTE) The eGFR has been calculated using the CKD EPI equation. This calculation has not been validated in all clinical situations. eGFR's persistently <60 mL/min signify possible Chronic Kidney Disease.    Anion gap 19 (H) 5 - 15  CBC     Status: Abnormal   Collection Time: 09/16/16  3:36 AM  Result Value Ref Range   WBC 9.6 4.0 - 10.5 K/uL   RBC 3.55 (L) 4.22 - 5.81 MIL/uL   Hemoglobin 11.2 (L) 13.0 - 17.0 g/dL   HCT 34.4 (L) 39.0 - 52.0 %   MCV 96.9 78.0 - 100.0 fL   MCH 31.5 26.0 - 34.0 pg   MCHC 32.6 30.0 - 36.0 g/dL   RDW 15.4 11.5 - 15.5 %   Platelets 155 150 - 400 K/uL    ABGS No results for input(s): PHART, PO2ART, TCO2, HCO3 in the last 72 hours.  Invalid input(s): PCO2 CULTURES Recent Results (from the past 240 hour(s))  MRSA PCR Screening     Status: None   Collection Time: 09/13/16  4:07 AM  Result Value Ref Range Status   MRSA by PCR NEGATIVE NEGATIVE Final    Comment:        The GeneXpert MRSA Assay (FDA approved for NASAL specimens only), is one component of a comprehensive MRSA colonization surveillance program. It is not intended to diagnose MRSA infection nor to guide or monitor treatment for MRSA infections.    Studies/Results: Dg Chest Port 1 View  Result Date: 09/14/2016 CLINICAL DATA:  Respiratory failure EXAM: PORTABLE  CHEST 1 VIEW COMPARISON:  September 12, 2016 FINDINGS: There is no edema or consolidation. There is stable cardiomegaly with pulmonary venous hypertension. No adenopathy. There is postoperative change in the lower cervical spine. IMPRESSION: Persistent pulmonary vascular congestion without edema or consolidation. Electronically Signed   By: Lowella Grip III M.D.   On: 09/14/2016 10:36    Medications:  Prior to Admission:  Facility-Administered Medications Prior to Admission  Medication Dose Route Frequency Provider Last Rate Last Dose  . 0.9 %  sodium chloride infusion  500 mL Intravenous Continuous Milus Banister, MD       Prescriptions Prior to Admission  Medication Sig Dispense Refill Last Dose  . acetaminophen (TYLENOL) 500 MG tablet Take 1 tablet (500 mg total) by mouth every 6 (six) hours as needed for moderate pain.    09/12/2016 at Unknown time  . amLODipine (NORVASC) 10 MG tablet One twice daily to control BP 180 tablet 3 09/12/2016 at Unknown time  . aspirin EC 81 MG tablet Take 81 mg by mouth daily.   09/12/2016 at Unknown time  . b complex-vitamin c-folic acid (NEPHRO-VITE) 0.8 MG TABS tablet TAKE 1 TABLET BY MOUTH EVERY DAY 90 tablet 1 09/12/2016 at Unknown time  . budesonide-formoterol (SYMBICORT) 80-4.5 MCG/ACT inhaler Inhale 2 puffs into the lungs 2 (two) times daily. 1 Inhaler 1 unknown  . calcitRIOL (ROCALTROL) 0.25 MCG capsule Take 1 capsule (0.25 mcg total) by mouth every Monday, Wednesday, and Friday with hemodialysis. 30 capsule 1 09/12/2016 at Unknown time  . Cholecalciferol (VITAMIN D3) 5000 UNITS TABS Take 1 tablet by mouth daily.   09/12/2016 at Unknown time  . dextromethorphan-guaiFENesin (MUCINEX DM) 30-600 MG 12hr tablet Take 1 tablet by mouth 2 (two) times daily.   09/12/2016 at Unknown time  . diphenhydrAMINE (BENADRYL) 25 MG tablet Take 50 mg by mouth 2 (two) times daily as needed for allergies.    09/12/2016 at Unknown time  . guaiFENesin-dextromethorphan (ROBITUSSIN DM) 100-10 MG/5ML syrup Take 10 mLs by mouth every 4 (four) hours as needed for cough.   09/12/2016 at Unknown time  . hydrALAZINE (APRESOLINE) 25 MG tablet One twice daily to regulate BP (Patient taking differently: Take 25 mg by mouth 3 (three) times daily. One twice daily to regulate BP) 180 tablet 3 09/12/2016 at Unknown time  . ipratropium-albuterol (DUONEB) 0.5-2.5 (3) MG/3ML SOLN Take 3 mLs by nebulization 3 (three) times daily. ICD 10 code J44.9 360 mL 0 09/12/2016 at Unknown time  . metoCLOPramide (REGLAN) 10 MG tablet One before each meal to increase stomach emptying (Patient taking differently: Take 10 mg by mouth 3 (three) times daily before meals. One before each meal to increase stomach emptying) 90 tablet 5 09/12/2016 at Unknown time  . metoprolol tartrate (LOPRESSOR) 50 MG tablet One twice daily to regulate BP 180 tablet 3  09/12/2016 at 2215  . omeprazole (PRILOSEC) 20 MG capsule TAKE 1 CAPSULE (20 MG TOTAL) BY MOUTH 2 (TWO) TIMES DAILY BEFORE A MEAL. 60 capsule 0 09/12/2016 at Unknown time  . pravastatin (PRAVACHOL) 40 MG tablet Take 1 tablet (40 mg total) by mouth every evening. 90 tablet 1 09/12/2016 at Unknown time  . promethazine (PHENERGAN) 25 MG tablet TAKE 1 TABLET BY MOUTH EVERY 8 HOURS AS NEEDED FOR NAUSEA AND VOMITING.. CAN FILL 08/10 90 tablet 1 unknown at Unknown time  . traMADol (ULTRAM) 50 MG tablet One up to 4 times daly if needed for pain (Patient taking differently: Take 50 mg by mouth 4 (  four) times daily as needed for moderate pain. One up to 4 times daly if needed for pain) 100 tablet 3 unknown   Scheduled: . amLODipine  10 mg Oral Daily  . aspirin EC  81 mg Oral Daily  . calcitRIOL  0.25 mcg Oral Q M,W,F-HD  . cholecalciferol  5,000 Units Oral Daily  . epoetin (EPOGEN/PROCRIT) injection  1,000 Units Intravenous Q M,W,F-HD  . guaiFENesin  1,200 mg Oral BID  . heparin  5,000 Units Subcutaneous Q8H  . hydrALAZINE  50 mg Oral Q8H  . ipratropium-albuterol  3 mL Nebulization TID  . methylPREDNISolone (SOLU-MEDROL) injection  60 mg Intravenous Q6H  . metoprolol tartrate  50 mg Oral BID  . mometasone-formoterol  2 puff Inhalation BID  . pantoprazole  40 mg Oral Daily  . pravastatin  40 mg Oral QPM  . sevelamer carbonate  800 mg Oral TID WC  . sodium chloride flush  3 mL Intravenous Q12H   Continuous: . sodium chloride    . sodium chloride    . sodium chloride    . sodium chloride    . sodium chloride     EKC:[id number] chloride, sodium chloride, sodium chloride, sodium chloride, sodium chloride, diphenhydrAMINE, guaiFENesin-dextromethorphan, heparin, lidocaine (PF), lidocaine-prilocaine, ondansetron **OR** ondansetron (ZOFRAN) IV, pentafluoroprop-tetrafluoroeth, sodium chloride flush, traMADol  Assesment: He was admitted with respiratory failure presumably from pulmonary edema. He has end-stage  renal disease on dialysis. He has asthma at baseline and he's been treated with steroids inhaled bronchodilators. He has some congestion still but his lungs are generally much clearer and he looks much better today. Active Problems:   OSA on CPAP   ESRD on dialysis [facility])   Pulmonary edema    Plan: Continue current treatments.    LOS: 3 days   Tatym Schermer L 09/16/2016, 7:00 AM

## 2016-09-16 NOTE — Progress Notes (Signed)
PROGRESS NOTE    Kirk [name]  ZOX:[id number] DOB: Oct 20, 1960 DOA: 09/12/2016 PCP: Gayland Curry, DO     Brief Narrative:  56 year old man admitted to the hospital on 8/23 due to shortness of breath. He has a history of asthma but was also found to have pulmonary vascular congestion. He has end-stage renal disease.   Assessment & Plan:   Active Problems:   OSA on CPAP   ESRD on dialysis [facility])   Pulmonary edema   Acute hypoxemic respiratory failure -Due to pulmonary edema plus minus asthma component in a patient who has end-stage renal disease on hemodialysis. -We are working on weaning him off BiPAP, anxiety plays a huge role into why this has been difficult. He used BiPAP last night but generally looks improved, was able to ambulate some yesterday. He still has a lot of nonproductive coughing, less short of breath. -Repeat chest x-ray continues to show pulmonary vascular congestion. Should be due for dialysis again tomorrow. -Received dialysis on 3 consecutive days. -Solu-Medrol was added on 8/24. Less wheezing on exam today. -Continue 3 times daily duonebs.  End-stage renal disease -On hemodialysis -Nephrology following.  Hypertension -Fair control, continue amlodipine, metoprolol, hydralazine   DVT prophylaxis: Subcutaneous heparin Code Status: Full code Family Communication: Family members at bedside updated on plan of care Disposition Plan: Keep in ICU today  Consultants:   Pulmonary, Dr. Luan Pulling  Procedures:   None  Antimicrobials:  Anti-infectives    None       Subjective: He feels a little improved and a little less short of breath, didn't need to go back on BiPAP overnight and is currently wearing BiPAP while he takes a nap.  Objective: Vitals:   09/16/16 0725 09/16/16 0800 09/16/16 0933 09/16/16 0934  BP:   (!) 150/65 (!) 150/65  Pulse:      Resp:      Temp:  97.6 F (36.4 C)    TempSrc:  Oral    SpO2: 93%     Weight:      Height:         Intake/Output Summary (Last 24 hours) at 09/16/16 1120 Last data filed at 09/16/16 1111  Gross per 24 hour  Intake              323 ml  Output               25 ml  Net              298 ml   Filed Weights   09/13/16 1725 09/14/16 1315 09/16/16 0500  Weight: 103 kg (227 lb 1.2 oz) 103 kg (227 lb 1.2 oz) 102.6 kg (226 lb 3.1 oz)    Examination:  General exam: Alert, awake, oriented x 3 Respiratory system: Minimal wheezing, rhonchi Cardiovascular system:RRR. No murmurs, rubs, gallops. Gastrointestinal system: Abdomen is nondistended, soft and nontender. No organomegaly or masses felt. Normal bowel sounds heard. Central nervous system: Alert and oriented. No focal neurological deficits. Extremities: No C/C/E, +pedal pulses Skin: No rashes, lesions or ulcers Psychiatry: Judgement and insight appear normal. Mood & affect appropriate.     Data Reviewed: I have personally reviewed following labs and imaging studies  CBC:  Recent Labs Lab 09/12/16 2338 09/13/16 0416 09/14/16 0459 09/16/16 0336  WBC 10.0 9.8 6.0 9.6  NEUTROABS 8.9*  --   --   --   HGB 11.2* 10.2* 10.8* 11.2*  HCT 34.6* 31.1* 33.4* 34.4*  MCV 97.5 96.9 98.2 96.9  PLT 117* 105* 119* 478   Basic Metabolic Panel:  Recent Labs Lab 09/12/16 2338 09/13/16 0416 09/14/16 0459 09/16/16 0336  NA 139 137 136 135  K 3.4* 3.7 3.9 4.8  CL 98* 97* 93* 93*  CO2 25 24 26 23   GLUCOSE 172* 184* 134* 180*  BUN 43* 50* 50* 83*  CREATININE 8.57* 9.23* 9.70* 12.53*  CALCIUM 8.8* 8.6* 8.8* 9.4  PHOS  --   --  8.2*  --    GFR: Estimated Creatinine Clearance: 7.7 mL/min (A) (by C-G formula based on SCr of 12.53 mg/dL (H)). Liver Function Tests:  Recent Labs Lab 09/12/16 2338 09/13/16 0416 09/14/16 0459  AST 21 22  --   ALT 18 18  --   ALKPHOS 76 71  --   BILITOT 0.9 1.0  --   PROT 8.0 7.4  --   ALBUMIN 4.2 3.9 4.0   No results for input(s): LIPASE, AMYLASE in the last 168 hours. No results for input(s):  AMMONIA in the last 168 hours. Coagulation Profile: No results for input(s): INR, PROTIME in the last 168 hours. Cardiac Enzymes:  Recent Labs Lab 09/12/16 2338 09/13/16 0416 09/13/16 0939 09/13/16 1538  TROPONINI <0.03 <0.03 <0.03 <0.03   BNP (last 3 results) No results for input(s): PROBNP in the last 8760 hours. HbA1C: No results for input(s): HGBA1C in the last 72 hours. CBG: No results for input(s): GLUCAP in the last 168 hours. Lipid Profile: No results for input(s): CHOL, HDL, LDLCALC, TRIG, CHOLHDL, LDLDIRECT in the last 72 hours. Thyroid Function Tests: No results for input(s): TSH, T4TOTAL, FREET4, T3FREE, THYROIDAB in the last 72 hours. Anemia Panel: No results for input(s): VITAMINB12, FOLATE, FERRITIN, TIBC, IRON, RETICCTPCT in the last 72 hours. Urine analysis:    Component Value Date/Time   COLORURINE YELLOW 04/06/2014 1030   APPEARANCEUR CLEAR 04/06/2014 1030   LABSPEC 1.020 04/06/2014 1030   PHURINE 8.0 04/06/2014 1030   GLUCOSEU 100 (A) 04/06/2014 1030   HGBUR MODERATE (A) 04/06/2014 1030   BILIRUBINUR NEGATIVE 04/06/2014 1030   KETONESUR NEGATIVE 04/06/2014 1030   PROTEINUR >300 (A) 04/06/2014 1030   UROBILINOGEN 0.2 04/06/2014 1030   NITRITE NEGATIVE 04/06/2014 1030   LEUKOCYTESUR NEGATIVE 04/06/2014 1030   Sepsis Labs: @LABRCNTIP (procalcitonin:4,lacticidven:4)  ) Recent Results (from the past 240 hour(s))  MRSA PCR Screening     Status: None   Collection Time: 09/13/16  4:07 AM  Result Value Ref Range Status   MRSA by PCR NEGATIVE NEGATIVE Final    Comment:        The GeneXpert MRSA Assay (FDA approved for NASAL specimens only), is one component of a comprehensive MRSA colonization surveillance program. It is not intended to diagnose MRSA infection nor to guide or monitor treatment for MRSA infections.          Radiology Studies: Dg Chest Port 1 View  Result Date: 09/16/2016 CLINICAL DATA:  Acute respiratory failure with  hypoxemia. EXAM: PORTABLE CHEST 1 VIEW COMPARISON:  09/14/2016 FINDINGS: 0716 hours. The cardio pericardial silhouette is enlarged. There is pulmonary vascular congestion without overt pulmonary edema. The visualized bony structures of the thorax are intact. Telemetry leads overlie the chest. IMPRESSION: Stable exam.  Cardiomegaly with pulmonary vascular congestion. Electronically Signed   By: Misty Stanley M.D.   On: 09/16/2016 08:34        Scheduled Meds: . amLODipine  10 mg Oral Daily  . aspirin EC  81 mg Oral Daily  . calcitRIOL  0.25 mcg  Oral Q M,W,F-HD  . cholecalciferol  5,000 Units Oral Daily  . epoetin (EPOGEN/PROCRIT) injection  1,000 Units Intravenous Q M,W,F-HD  . guaiFENesin  1,200 mg Oral BID  . heparin  5,000 Units Subcutaneous Q8H  . hydrALAZINE  50 mg Oral Q8H  . ipratropium-albuterol  3 mL Nebulization TID  . methylPREDNISolone (SOLU-MEDROL) injection  60 mg Intravenous Q6H  . metoprolol tartrate  50 mg Oral BID  . mometasone-formoterol  2 puff Inhalation BID  . pantoprazole  40 mg Oral Daily  . pravastatin  40 mg Oral QPM  . sevelamer carbonate  800 mg Oral TID WC  . sodium chloride flush  3 mL Intravenous Q12H   Continuous Infusions: . sodium chloride    . sodium chloride    . sodium chloride    . sodium chloride    . sodium chloride       LOS: 3 days    Time spent: 30 minutes. Greater than 50% of this time was spent in direct contact with the patient coordinating care.     Lelon Frohlich, MD [facility] Pager [phone]  If 7PM-7AM, please contact night-coverage [email or web address] Password [email or web address] 09/16/2016, 11:20 AM

## 2016-09-17 LAB — RENAL FUNCTION PANEL
ANION GAP: 24 — AB (ref 5–15)
Albumin: 3.8 g/dL (ref 3.5–5.0)
BUN: 130 mg/dL — ABNORMAL HIGH (ref 6–20)
CHLORIDE: 90 mmol/L — AB (ref 101–111)
CO2: 16 mmol/L — AB (ref 22–32)
Calcium: 9.1 mg/dL (ref 8.9–10.3)
Creatinine, Ser: 15.77 mg/dL — ABNORMAL HIGH (ref 0.61–1.24)
GFR calc non Af Amer: 3 mL/min — ABNORMAL LOW (ref >60)
GFR, EST AFRICAN AMERICAN: 3 mL/min — AB (ref >60)
Glucose, Bld: 239 mg/dL — ABNORMAL HIGH (ref 65–99)
POTASSIUM: 4.9 mmol/L (ref 3.5–5.1)
Phosphorus: 7 mg/dL — ABNORMAL HIGH (ref 2.5–4.6)
Sodium: 130 mmol/L — ABNORMAL LOW (ref 135–145)

## 2016-09-17 LAB — CBC WITH DIFFERENTIAL/PLATELET
Basophils Absolute: 0 10*3/uL (ref 0.0–0.1)
Basophils Relative: 0 %
EOS PCT: 0 %
Eosinophils Absolute: 0 10*3/uL (ref 0.0–0.7)
HEMATOCRIT: 32.9 % — AB (ref 39.0–52.0)
HEMOGLOBIN: 11 g/dL — AB (ref 13.0–17.0)
LYMPHS ABS: 0.6 10*3/uL — AB (ref 0.7–4.0)
LYMPHS PCT: 4 %
MCH: 32.2 pg (ref 26.0–34.0)
MCHC: 33.4 g/dL (ref 30.0–36.0)
MCV: 96.2 fL (ref 78.0–100.0)
Monocytes Absolute: 0.4 10*3/uL (ref 0.1–1.0)
Monocytes Relative: 3 %
Neutro Abs: 12.4 10*3/uL — ABNORMAL HIGH (ref 1.7–7.7)
Neutrophils Relative %: 93 %
Platelets: 150 10*3/uL (ref 150–400)
RBC: 3.42 MIL/uL — AB (ref 4.22–5.81)
RDW: 15.8 % — ABNORMAL HIGH (ref 11.5–15.5)
WBC: 13.4 10*3/uL — AB (ref 4.0–10.5)

## 2016-09-17 MED ORDER — ACETAMINOPHEN 325 MG PO TABS
650.0000 mg | ORAL_TABLET | Freq: Four times a day (QID) | ORAL | Status: DC | PRN
  Administered 2016-09-17 – 2016-09-19 (×2): 650 mg via ORAL
  Filled 2016-09-17 (×2): qty 2

## 2016-09-17 MED ORDER — SEVELAMER CARBONATE 800 MG PO TABS
800.0000 mg | ORAL_TABLET | ORAL | Status: DC
  Administered 2016-09-17 – 2016-09-20 (×5): 800 mg via ORAL
  Filled 2016-09-17 (×5): qty 1

## 2016-09-17 MED ORDER — SEVELAMER CARBONATE 800 MG PO TABS
1600.0000 mg | ORAL_TABLET | Freq: Three times a day (TID) | ORAL | Status: DC
  Administered 2016-09-17 – 2016-09-21 (×12): 1600 mg via ORAL
  Filled 2016-09-17 (×12): qty 2

## 2016-09-17 NOTE — Progress Notes (Signed)
Patient asleep doing well no problems noted.

## 2016-09-17 NOTE — Progress Notes (Signed)
Inpatient Diabetes Program Recommendations  AACE/ADA: New Consensus Statement on Inpatient Glycemic Control (2015)  Target Ranges:  Prepandial:   less than 140 mg/dL      Peak postprandial:   less than 180 mg/dL (1-2 hours)      Critically ill patients:  140 - 180 mg/dL   Results for Kirk [name], Kirk [name] (MRN [id number]) as of 09/17/2016 14:45  Ref. Range 09/12/2016 23:38 09/13/2016 04:16 09/14/2016 04:59 09/16/2016 03:36 09/17/2016 08:46  Glucose Latest Ref Range: 65 - 99 mg/dL 172 (H) 184 (H) 134 (H) 180 (H) 239 (H)   Review of Glycemic Control  Diabetes history: No Outpatient Diabetes medications: NA Current orders for Inpatient glycemic control: None  Inpatient Diabetes Program Recommendations:  Correction (SSI): While inpatient and ordered steroids, please consider ordering CBGs with Novolog correction scale ACHS. HgbA1C: Please consider ordering an A1C to evaluate glycemic control over the past 2-3 months.  Thanks, Barnie Alderman, RN, MSN, CDE Diabetes Coordinator Inpatient Diabetes Program [phone] (Team Pager from 8am to 5pm)

## 2016-09-17 NOTE — Progress Notes (Signed)
Subjective: Interval History: has no complaint of some cough and sputum production but much better. He denies any difficulty breathing. Patient states that most of the time he has occasional cough specially in the morning even when he was at home..  Objective: Vital signs in last 24 hours: Temp:  [97.5 F (36.4 C)-98 F (36.7 C)] 98 F (36.7 C) (08/27 0400) Pulse Rate:  [64-80] 64 (08/27 0219) Resp:  [18-24] 24 (08/27 0219) BP: (139-178)/(62-77) 139/62 (08/26 2215) SpO2:  [90 %-98 %] 93 % (08/27 0726) Weight:  [102.5 kg (225 lb 15.5 oz)] 102.5 kg (225 lb 15.5 oz) (08/27 0500) Weight change: -0.1 kg (-3.5 oz)  Intake/Output from previous day: 08/26 0701 - 08/27 0700 In: 483 [P.O.:480; I.V.:3] Out: -  Intake/Output this shift: No intake/output data recorded.  General appearance: alert, cooperative and no distress Resp: wheezes bibasilar and bilaterally Cardio: regular rate and rhythm Extremities: No edema  Lab Results:  Recent Labs  09/16/16 0336  WBC 9.6  HGB 11.2*  HCT 34.4*  PLT 155   BMET:  Recent Labs  09/16/16 0336  NA 135  K 4.8  CL 93*  CO2 23  GLUCOSE 180*  BUN 83*  CREATININE 12.53*  CALCIUM 9.4   No results for input(s): PTH in the last 72 hours. Iron Studies: No results for input(s): IRON, TIBC, TRANSFERRIN, FERRITIN in the last 72 hours.  Studies/Results: Dg Chest Port 1 View  Result Date: 09/16/2016 CLINICAL DATA:  Acute respiratory failure with hypoxemia. EXAM: PORTABLE CHEST 1 VIEW COMPARISON:  09/14/2016 FINDINGS: 0716 hours. The cardio pericardial silhouette is enlarged. There is pulmonary vascular congestion without overt pulmonary edema. The visualized bony structures of the thorax are intact. Telemetry leads overlie the chest. IMPRESSION: Stable exam.  Cardiomegaly with pulmonary vascular congestion. Electronically Signed   By: Misty Stanley M.D.   On: 09/16/2016 08:34    I have reviewed the patient's current  medications.  Assessment/Plan: Problem #1 difficulty breathing: Possibly a combination of fluid overload and exacerbation of his asthma. Presently feeling better. Problem #2 end-stage renal disease: He is status post hemodialysis on Saturday. His potassium is normal. He doesn't have any nausea or vomiting. Problem #3 bone and mineral disorder: His calcium is ray and phosphorus is very high. Problem #4 hypertension: His blood pressure is reasonably controlled Problem #5. Anemia: His hemoglobin is within our target goal. Problem #6 history of obstructive sleep apnea Plan: 1]We'll dialyze patient today and will try to remove 3 liters if his blood pressure tolerates 2] We will increase  Renvela 800 mg to 2 tablets by mouth 3 times a day with meals and one with snack 3] his next dialysis will be on Wednesday which is his regular schedule.   LOS: 4 days   Xariah Silvernail S 09/17/2016,8:16 AM

## 2016-09-17 NOTE — Progress Notes (Addendum)
Subjective: He says he feels better. He did not use BiPAP last night but did use CPAP that he uses at home. He says he typically has some cough and congestion in the mornings. He has had to use his nebulizer at home more frequently in the last several weeks than he did previously. He is set for dialysis today. He is still coughing mostly nonproductively now.  Objective: Vital signs in last 24 hours: Temp:  [97.5 F (36.4 C)-98 F (36.7 C)] 98 F (36.7 C) (08/27 0400) Pulse Rate:  [64-80] 64 (08/27 0219) Resp:  [18-24] 24 (08/27 0219) BP: (139-178)/(62-77) 139/62 (08/26 2215) SpO2:  [90 %-98 %] 93 % (08/27 0726) Weight:  [102.5 kg (225 lb 15.5 oz)] 102.5 kg (225 lb 15.5 oz) (08/27 0500) Weight change: -0.1 kg (-3.5 oz) Last BM Date: 09/14/16  Intake/Output from previous day: 08/26 0701 - 08/27 0700 In: 483 [P.O.:480; I.V.:3] Out: -   PHYSICAL EXAM General appearance: alert, cooperative and no distress Resp: He has fleeting and expiratory wheezes which clear when he takes a deep breath Cardio: regular rate and rhythm, S1, S2 normal, no murmur, click, rub or gallop GI: soft, non-tender; bowel sounds normal; no masses,  no organomegaly Extremities: extremities normal, atraumatic, no cyanosis or edema Skin warm and dry  Lab Results:  Results for orders placed or performed during the hospital encounter of 09/12/16 (from the past 48 hour(s))  Basic metabolic panel     Status: Abnormal   Collection Time: 09/16/16  3:36 AM  Result Value Ref Range   Sodium 135 135 - 145 mmol/L   Potassium 4.8 3.5 - 5.1 mmol/L    Comment: DELTA CHECK NOTED   Chloride 93 (L) 101 - 111 mmol/L   CO2 23 22 - 32 mmol/L   Glucose, Bld 180 (H) 65 - 99 mg/dL   BUN 83 (H) 6 - 20 mg/dL   Creatinine, Ser 04.62 (H) 0.61 - 1.24 mg/dL   Calcium 9.4 8.9 - 98.1 mg/dL   GFR calc non Af Amer 4 (L) >60 mL/min   GFR calc Af Amer 5 (L) >60 mL/min    Comment: (NOTE) The eGFR has been calculated using the CKD EPI  equation. This calculation has not been validated in all clinical situations. eGFR's persistently <60 mL/min signify possible Chronic Kidney Disease.    Anion gap 19 (H) 5 - 15  CBC     Status: Abnormal   Collection Time: 09/16/16  3:36 AM  Result Value Ref Range   WBC 9.6 4.0 - 10.5 K/uL   RBC 3.55 (L) 4.22 - 5.81 MIL/uL   Hemoglobin 11.2 (L) 13.0 - 17.0 g/dL   HCT 29.8 (L) 59.3 - 44.5 %   MCV 96.9 78.0 - 100.0 fL   MCH 31.5 26.0 - 34.0 pg   MCHC 32.6 30.0 - 36.0 g/dL   RDW 57.9 14.3 - 04.1 %   Platelets 155 150 - 400 K/uL    ABGS No results for input(s): PHART, PO2ART, TCO2, HCO3 in the last 72 hours.  Invalid input(s): PCO2 CULTURES Recent Results (from the past 240 hour(s))  MRSA PCR Screening     Status: None   Collection Time: 09/13/16  4:07 AM  Result Value Ref Range Status   MRSA by PCR NEGATIVE NEGATIVE Final    Comment:        The GeneXpert MRSA Assay (FDA approved for NASAL specimens only), is one component of a comprehensive MRSA colonization surveillance program. It is not  intended to diagnose MRSA infection nor to guide or monitor treatment for MRSA infections.    Studies/Results: Dg Chest Port 1 View  Result Date: 09/16/2016 CLINICAL DATA:  Acute respiratory failure with hypoxemia. EXAM: PORTABLE CHEST 1 VIEW COMPARISON:  09/14/2016 FINDINGS: 0716 hours. The cardio pericardial silhouette is enlarged. There is pulmonary vascular congestion without overt pulmonary edema. The visualized bony structures of the thorax are intact. Telemetry leads overlie the chest. IMPRESSION: Stable exam.  Cardiomegaly with pulmonary vascular congestion. Electronically Signed   By: Misty Stanley M.D.   On: 09/16/2016 08:34    Medications:  Prior to Admission:  Facility-Administered Medications Prior to Admission  Medication Dose Route Frequency Provider Last Rate Last Dose  . 0.9 %  sodium chloride infusion  500 mL Intravenous Continuous Milus Banister, MD        Prescriptions Prior to Admission  Medication Sig Dispense Refill Last Dose  . acetaminophen (TYLENOL) 500 MG tablet Take 1 tablet (500 mg total) by mouth every 6 (six) hours as needed for moderate pain.   09/12/2016 at Unknown time  . amLODipine (NORVASC) 10 MG tablet One twice daily to control BP 180 tablet 3 09/12/2016 at Unknown time  . aspirin EC 81 MG tablet Take 81 mg by mouth daily.   09/12/2016 at Unknown time  . b complex-vitamin c-folic acid (NEPHRO-VITE) 0.8 MG TABS tablet TAKE 1 TABLET BY MOUTH EVERY DAY 90 tablet 1 09/12/2016 at Unknown time  . budesonide-formoterol (SYMBICORT) 80-4.5 MCG/ACT inhaler Inhale 2 puffs into the lungs 2 (two) times daily. 1 Inhaler 1 unknown  . calcitRIOL (ROCALTROL) 0.25 MCG capsule Take 1 capsule (0.25 mcg total) by mouth every Monday, Wednesday, and Friday with hemodialysis. 30 capsule 1 09/12/2016 at Unknown time  . Cholecalciferol (VITAMIN D3) 5000 UNITS TABS Take 1 tablet by mouth daily.   09/12/2016 at Unknown time  . dextromethorphan-guaiFENesin (MUCINEX DM) 30-600 MG 12hr tablet Take 1 tablet by mouth 2 (two) times daily.   09/12/2016 at Unknown time  . diphenhydrAMINE (BENADRYL) 25 MG tablet Take 50 mg by mouth 2 (two) times daily as needed for allergies.    09/12/2016 at Unknown time  . guaiFENesin-dextromethorphan (ROBITUSSIN DM) 100-10 MG/5ML syrup Take 10 mLs by mouth every 4 (four) hours as needed for cough.   09/12/2016 at Unknown time  . hydrALAZINE (APRESOLINE) 25 MG tablet One twice daily to regulate BP (Patient taking differently: Take 25 mg by mouth 3 (three) times daily. One twice daily to regulate BP) 180 tablet 3 09/12/2016 at Unknown time  . ipratropium-albuterol (DUONEB) 0.5-2.5 (3) MG/3ML SOLN Take 3 mLs by nebulization 3 (three) times daily. ICD 10 code J44.9 360 mL 0 09/12/2016 at Unknown time  . metoCLOPramide (REGLAN) 10 MG tablet One before each meal to increase stomach emptying (Patient taking differently: Take 10 mg by mouth 3 (three)  times daily before meals. One before each meal to increase stomach emptying) 90 tablet 5 09/12/2016 at Unknown time  . metoprolol tartrate (LOPRESSOR) 50 MG tablet One twice daily to regulate BP 180 tablet 3 09/12/2016 at 2215  . omeprazole (PRILOSEC) 20 MG capsule TAKE 1 CAPSULE (20 MG TOTAL) BY MOUTH 2 (TWO) TIMES DAILY BEFORE A MEAL. 60 capsule 0 09/12/2016 at Unknown time  . pravastatin (PRAVACHOL) 40 MG tablet Take 1 tablet (40 mg total) by mouth every evening. 90 tablet 1 09/12/2016 at Unknown time  . promethazine (PHENERGAN) 25 MG tablet TAKE 1 TABLET BY MOUTH EVERY 8 HOURS AS NEEDED  FOR NAUSEA AND VOMITING.. CAN FILL 08/10 90 tablet 1 unknown at Unknown time  . traMADol (ULTRAM) 50 MG tablet One up to 4 times daly if needed for pain (Patient taking differently: Take 50 mg by mouth 4 (four) times daily as needed for moderate pain. One up to 4 times daly if needed for pain) 100 tablet 3 unknown   Scheduled: . amLODipine  10 mg Oral Daily  . aspirin EC  81 mg Oral Daily  . calcitRIOL  0.25 mcg Oral Q M,W,F-HD  . cholecalciferol  5,000 Units Oral Daily  . guaiFENesin  1,200 mg Oral BID  . heparin  5,000 Units Subcutaneous Q8H  . hydrALAZINE  50 mg Oral Q8H  . ipratropium-albuterol  3 mL Nebulization TID  . methylPREDNISolone (SOLU-MEDROL) injection  60 mg Intravenous Q6H  . metoprolol tartrate  50 mg Oral BID  . mometasone-formoterol  2 puff Inhalation BID  . pantoprazole  40 mg Oral Daily  . pravastatin  40 mg Oral QPM  . sevelamer carbonate  800 mg Oral TID WC  . sodium chloride flush  3 mL Intravenous Q12H   Continuous: . sodium chloride    . sodium chloride    . sodium chloride    . sodium chloride    . sodium chloride     SHN:GITJLL chloride, sodium chloride, sodium chloride, sodium chloride, sodium chloride, diphenhydrAMINE, guaiFENesin-dextromethorphan, heparin, lidocaine (PF), lidocaine-prilocaine, ondansetron **OR** ondansetron (ZOFRAN) IV, pentafluoroprop-tetrafluoroeth,  sodium chloride flush, traMADol  Assesment: He was admitted with acute hypoxic respiratory failure that required BiPAP. He was very short of breath. Most of this appeared to be from volume overload but he also has a history of significant problems with asthma. He is substantially improved. He is not on any maintenance inhalers at home and has a rescue inhaler and uses his nebulizer but he might be better served by being on some sort of maintenance inhaler . He is on [facility] here. Active Problems:   OSA on CPAP   ESRD on dialysis [facility])   Pulmonary edema    Plan: Continue Dulera. I would plan to send him home on that assuming that his insurance will pay for it. He may need to be switched to a inhaled corticosteroid without the long-acting bronchodilator later but right now I think he needs the long-acting bronchodilator    LOS: 4 days   Murad Staples L 09/17/2016, 7:50 AM

## 2016-09-17 NOTE — Progress Notes (Signed)
PROGRESS NOTE    Kirk [name]  WUJ:[id number] DOB: September 04, 1960 DOA: 09/12/2016 PCP: Gayland Curry, DO     Brief Narrative:  56 year old man admitted to the hospital on 8/23 due to shortness of breath. He has a history of asthma but was also found to have pulmonary vascular congestion. He has end-stage renal disease.   Assessment & Plan:   Active Problems:   OSA on CPAP   ESRD on dialysis [facility])   Pulmonary edema   Acute hypoxemic respiratory failure -Due to pulmonary edema plus minus asthma component in a patient who has end-stage renal disease on hemodialysis. -We are working on weaning him off BiPAP, anxiety plays a huge role into why this has been difficult.  -He used BiPAP last night, I saw him around noon today and he had spent all morning off of BiPAP. -For repeat dialysis session today. -Received dialysis on 3 consecutive days. -Solu-Medrol was added on 8/24. Less wheezing on exam today. -Continue 3 times daily duonebs.  End-stage renal disease -On hemodialysis -Nephrology following.  Hypertension -Fair control, continue amlodipine, metoprolol, hydralazine   DVT prophylaxis: Subcutaneous heparin Code Status: Full code Family Communication: Patient only Disposition Plan: Keep in ICU until able to spend full day without BiPAP.  Consultants:   Pulmonary, Dr. Luan Pulling  Procedures:   None  Antimicrobials:  Anti-infectives    None       Subjective: He feels a little better, less short of breath  Objective: Vitals:   09/17/16 1245 09/17/16 1328 09/17/16 1344 09/17/16 1650  BP:  138/60  (!) 152/68  Pulse:  80    Resp:      Temp: 97.7 F (36.5 C)     TempSrc: Oral     SpO2:   92%   Weight:      Height:        Intake/Output Summary (Last 24 hours) at 09/17/16 1848 Last data filed at 09/17/16 1351  Gross per 24 hour  Intake                3 ml  Output             3000 ml  Net            -2997 ml   Filed Weights   09/16/16 0500 09/17/16  0500 09/17/16 0830  Weight: 102.6 kg (226 lb 3.1 oz) 102.5 kg (225 lb 15.5 oz) 102.5 kg (225 lb 15.5 oz)    Examination:  General exam: Alert, awake, oriented x 3 Respiratory system: Clear to auscultation. Respiratory effort normal. Minimal wheezing Cardiovascular system:RRR. No murmurs, rubs, gallops. Gastrointestinal system: Abdomen is nondistended, soft and nontender. No organomegaly or masses felt. Normal bowel sounds heard. Central nervous system: Alert and oriented. No focal neurological deficits. Extremities: No C/C/E, +pedal pulses Skin: No rashes, lesions or ulcers Psychiatry: Judgement and insight appear normal. Mood & affect appropriate.      Data Reviewed: I have personally reviewed following labs and imaging studies  CBC:  Recent Labs Lab 09/12/16 2338 09/13/16 0416 09/14/16 0459 09/16/16 0336 09/17/16 0846  WBC 10.0 9.8 6.0 9.6 13.4*  NEUTROABS 8.9*  --   --   --  12.4*  HGB 11.2* 10.2* 10.8* 11.2* 11.0*  HCT 34.6* 31.1* 33.4* 34.4* 32.9*  MCV 97.5 96.9 98.2 96.9 96.2  PLT 117* 105* 119* 155 956   Basic Metabolic Panel:  Recent Labs Lab 09/12/16 2338 09/13/16 0416 09/14/16 0459 09/16/16 0336 09/17/16 0846  NA 139  137 136 135 130*  K 3.4* 3.7 3.9 4.8 4.9  CL 98* 97* 93* 93* 90*  CO2 25 24 26 23  16*  GLUCOSE 172* 184* 134* 180* 239*  BUN 43* 50* 50* 83* 130*  CREATININE 8.57* 9.23* 9.70* 12.53* 15.77*  CALCIUM 8.8* 8.6* 8.8* 9.4 9.1  PHOS  --   --  8.2*  --  7.0*   GFR: Estimated Creatinine Clearance: 6.1 mL/min (A) (by C-G formula based on SCr of 15.77 mg/dL (H)). Liver Function Tests:  Recent Labs Lab 09/12/16 2338 09/13/16 0416 09/14/16 0459 09/17/16 0846  AST 21 22  --   --   ALT 18 18  --   --   ALKPHOS 76 71  --   --   BILITOT 0.9 1.0  --   --   PROT 8.0 7.4  --   --   ALBUMIN 4.2 3.9 4.0 3.8   No results for input(s): LIPASE, AMYLASE in the last 168 hours. No results for input(s): AMMONIA in the last 168 hours. Coagulation  Profile: No results for input(s): INR, PROTIME in the last 168 hours. Cardiac Enzymes:  Recent Labs Lab 09/12/16 2338 09/13/16 0416 09/13/16 0939 09/13/16 1538  TROPONINI <0.03 <0.03 <0.03 <0.03   BNP (last 3 results) No results for input(s): PROBNP in the last 8760 hours. HbA1C: No results for input(s): HGBA1C in the last 72 hours. CBG: No results for input(s): GLUCAP in the last 168 hours. Lipid Profile: No results for input(s): CHOL, HDL, LDLCALC, TRIG, CHOLHDL, LDLDIRECT in the last 72 hours. Thyroid Function Tests: No results for input(s): TSH, T4TOTAL, FREET4, T3FREE, THYROIDAB in the last 72 hours. Anemia Panel: No results for input(s): VITAMINB12, FOLATE, FERRITIN, TIBC, IRON, RETICCTPCT in the last 72 hours. Urine analysis:    Component Value Date/Time   COLORURINE YELLOW 04/06/2014 1030   APPEARANCEUR CLEAR 04/06/2014 1030   LABSPEC 1.020 04/06/2014 1030   PHURINE 8.0 04/06/2014 1030   GLUCOSEU 100 (A) 04/06/2014 1030   HGBUR MODERATE (A) 04/06/2014 1030   BILIRUBINUR NEGATIVE 04/06/2014 1030   KETONESUR NEGATIVE 04/06/2014 1030   PROTEINUR >300 (A) 04/06/2014 1030   UROBILINOGEN 0.2 04/06/2014 1030   NITRITE NEGATIVE 04/06/2014 1030   LEUKOCYTESUR NEGATIVE 04/06/2014 1030   Sepsis Labs: @LABRCNTIP (procalcitonin:4,lacticidven:4)  ) Recent Results (from the past 240 hour(s))  MRSA PCR Screening     Status: None   Collection Time: 09/13/16  4:07 AM  Result Value Ref Range Status   MRSA by PCR NEGATIVE NEGATIVE Final    Comment:        The GeneXpert MRSA Assay (FDA approved for NASAL specimens only), is one component of a comprehensive MRSA colonization surveillance program. It is not intended to diagnose MRSA infection nor to guide or monitor treatment for MRSA infections.          Radiology Studies: Dg Chest Port 1 View  Result Date: 09/16/2016 CLINICAL DATA:  Acute respiratory failure with hypoxemia. EXAM: PORTABLE CHEST 1 VIEW  COMPARISON:  09/14/2016 FINDINGS: 0716 hours. The cardio pericardial silhouette is enlarged. There is pulmonary vascular congestion without overt pulmonary edema. The visualized bony structures of the thorax are intact. Telemetry leads overlie the chest. IMPRESSION: Stable exam.  Cardiomegaly with pulmonary vascular congestion. Electronically Signed   By: Misty Stanley M.D.   On: 09/16/2016 08:34        Scheduled Meds: . amLODipine  10 mg Oral Daily  . aspirin EC  81 mg Oral Daily  . calcitRIOL  0.25 mcg Oral Q M,W,F-HD  . cholecalciferol  5,000 Units Oral Daily  . guaiFENesin  1,200 mg Oral BID  . heparin  5,000 Units Subcutaneous Q8H  . hydrALAZINE  50 mg Oral Q8H  . ipratropium-albuterol  3 mL Nebulization TID  . methylPREDNISolone (SOLU-MEDROL) injection  60 mg Intravenous Q6H  . metoprolol tartrate  50 mg Oral BID  . mometasone-formoterol  2 puff Inhalation BID  . pantoprazole  40 mg Oral Daily  . pravastatin  40 mg Oral QPM  . sevelamer carbonate  1,600 mg Oral TID WC  . sevelamer carbonate  800 mg Oral With snacks  . sodium chloride flush  3 mL Intravenous Q12H   Continuous Infusions: . sodium chloride    . sodium chloride    . sodium chloride    . sodium chloride    . sodium chloride       LOS: 4 days    Time spent: 30 minutes. Greater than 50% of this time was spent in direct contact with the patient coordinating care.     Lelon Frohlich, MD [facility] Pager [phone]  If 7PM-7AM, please contact night-coverage [email or web address] Password [facility] 09/17/2016, 6:48 PM

## 2016-09-18 ENCOUNTER — Inpatient Hospital Stay (HOSPITAL_COMMUNITY): Payer: Medicare Other

## 2016-09-18 DIAGNOSIS — J9801 Acute bronchospasm: Secondary | ICD-10-CM

## 2016-09-18 MED ORDER — NOREPINEPHRINE BITARTRATE 1 MG/ML IV SOLN
INTRAVENOUS | Status: AC
  Filled 2016-09-18: qty 4

## 2016-09-18 MED ORDER — IPRATROPIUM-ALBUTEROL 0.5-2.5 (3) MG/3ML IN SOLN
3.0000 mL | RESPIRATORY_TRACT | Status: DC | PRN
  Administered 2016-09-19: 3 mL via RESPIRATORY_TRACT
  Filled 2016-09-18 (×3): qty 3

## 2016-09-18 MED ORDER — NOREPINEPHRINE BITARTRATE 1 MG/ML IV SOLN
0.0000 ug/min | INTRAVENOUS | Status: DC
  Filled 2016-09-18: qty 4

## 2016-09-18 MED ORDER — FUROSEMIDE 10 MG/ML IJ SOLN
60.0000 mg | Freq: Once | INTRAMUSCULAR | Status: AC
  Administered 2016-09-18: 60 mg via INTRAVENOUS
  Filled 2016-09-18: qty 6

## 2016-09-18 MED ORDER — ALUM & MAG HYDROXIDE-SIMETH 200-200-20 MG/5ML PO SUSP
30.0000 mL | ORAL | Status: DC | PRN
  Administered 2016-09-18: 30 mL via ORAL
  Filled 2016-09-18: qty 30

## 2016-09-18 MED ORDER — IPRATROPIUM-ALBUTEROL 0.5-2.5 (3) MG/3ML IN SOLN
3.0000 mL | RESPIRATORY_TRACT | Status: DC
  Administered 2016-09-18: 3 mL via RESPIRATORY_TRACT

## 2016-09-18 MED ORDER — SIMETHICONE 80 MG PO CHEW
160.0000 mg | CHEWABLE_TABLET | Freq: Four times a day (QID) | ORAL | Status: DC | PRN
  Administered 2016-09-18: 160 mg via ORAL
  Filled 2016-09-18: qty 2

## 2016-09-18 MED ORDER — GI COCKTAIL ~~LOC~~
30.0000 mL | Freq: Three times a day (TID) | ORAL | Status: DC | PRN
  Administered 2016-09-18 – 2016-09-20 (×2): 30 mL via ORAL
  Filled 2016-09-18 (×2): qty 30

## 2016-09-18 NOTE — Progress Notes (Signed)
PROGRESS NOTE    Kirk [name]  CVE:[id number] DOB: 1960-05-04 DOA: 09/12/2016 PCP: Gayland Curry, DO     Brief Narrative:  56 year old man admitted to the hospital on 8/23 due to shortness of breath. He has a history of asthma but was also found to have pulmonary vascular congestion. He has end-stage renal disease.   Assessment & Plan:   Active Problems:   OSA on CPAP   ESRD on dialysis [facility])   Pulmonary edema   Acute hypoxemic respiratory failure -Due to pulmonary edema plus minus asthma component in a patient who has end-stage renal disease on hemodialysis. -We are working on weaning him off BiPAP, anxiety plays a huge role into why this has been difficult.  -He continues to intermittently go in and off BiPAP. -Continue dialysis as indicated by nephrology for fluid removal. -Solu-Medrol was added on 8/24. Increased wheezing on exam today. -Continue 3 times daily duonebs.  End-stage renal disease -On hemodialysis -Nephrology following.  Hypertension -Fair control, continue amlodipine, metoprolol, hydralazine   DVT prophylaxis: Subcutaneous heparin Code Status: Full code Family Communication: Patient only Disposition Plan: Keep in ICU until able to spend full day without BiPAP.  Consultants:   Pulmonary, Dr. Luan Pulling  Procedures:   None  Antimicrobials:  Anti-infectives    None       Subjective: Says he had an awful night with coughing and increased shortness of breath.  Objective: Vitals:   09/18/16 0841 09/18/16 0848 09/18/16 1400 09/18/16 1600  BP:      Pulse:      Resp:      Temp:    98 F (36.7 C)  TempSrc:    Oral  SpO2: 94% 97% 96%   Weight:      Height:        Intake/Output Summary (Last 24 hours) at 09/18/16 1624 Last data filed at 09/17/16 2000  Gross per 24 hour  Intake              240 ml  Output                0 ml  Net              240 ml   Filed Weights   09/17/16 0500 09/17/16 0830 09/18/16 0500  Weight: 102.5 kg  (225 lb 15.5 oz) 102.5 kg (225 lb 15.5 oz) 100.3 kg (221 lb 1.9 oz)    Examination:  General exam: Alert, awake, oriented x 3, Sitting at bedside in no apparent distress Respiratory system: Coarse bilateral breath sounds and expiratory wheezes Cardiovascular system:RRR. No murmurs, rubs, gallops. Gastrointestinal system: Abdomen is nondistended, soft and nontender. No organomegaly or masses felt. Normal bowel sounds heard. Central nervous system: Alert and oriented. No focal neurological deficits. Extremities: No C/C/E, +pedal pulses Skin: No rashes, lesions or ulcers Psychiatry: Judgement and insight appear normal. Mood & affect appropriate.       Data Reviewed: I have personally reviewed following labs and imaging studies  CBC:  Recent Labs Lab 09/12/16 2338 09/13/16 0416 09/14/16 0459 09/16/16 0336 09/17/16 0846  WBC 10.0 9.8 6.0 9.6 13.4*  NEUTROABS 8.9*  --   --   --  12.4*  HGB 11.2* 10.2* 10.8* 11.2* 11.0*  HCT 34.6* 31.1* 33.4* 34.4* 32.9*  MCV 97.5 96.9 98.2 96.9 96.2  PLT 117* 105* 119* 155 025   Basic Metabolic Panel:  Recent Labs Lab 09/12/16 2338 09/13/16 0416 09/14/16 0459 09/16/16 0336 09/17/16 0846  NA 139  137 136 135 130*  K 3.4* 3.7 3.9 4.8 4.9  CL 98* 97* 93* 93* 90*  CO2 25 24 26 23  16*  GLUCOSE 172* 184* 134* 180* 239*  BUN 43* 50* 50* 83* 130*  CREATININE 8.57* 9.23* 9.70* 12.53* 15.77*  CALCIUM 8.8* 8.6* 8.8* 9.4 9.1  PHOS  --   --  8.2*  --  7.0*   GFR: Estimated Creatinine Clearance: 6 mL/min (A) (by C-G formula based on SCr of 15.77 mg/dL (H)). Liver Function Tests:  Recent Labs Lab 09/12/16 2338 09/13/16 0416 09/14/16 0459 09/17/16 0846  AST 21 22  --   --   ALT 18 18  --   --   ALKPHOS 76 71  --   --   BILITOT 0.9 1.0  --   --   PROT 8.0 7.4  --   --   ALBUMIN 4.2 3.9 4.0 3.8   No results for input(s): LIPASE, AMYLASE in the last 168 hours. No results for input(s): AMMONIA in the last 168 hours. Coagulation  Profile: No results for input(s): INR, PROTIME in the last 168 hours. Cardiac Enzymes:  Recent Labs Lab 09/12/16 2338 09/13/16 0416 09/13/16 0939 09/13/16 1538  TROPONINI <0.03 <0.03 <0.03 <0.03   BNP (last 3 results) No results for input(s): PROBNP in the last 8760 hours. HbA1C: No results for input(s): HGBA1C in the last 72 hours. CBG: No results for input(s): GLUCAP in the last 168 hours. Lipid Profile: No results for input(s): CHOL, HDL, LDLCALC, TRIG, CHOLHDL, LDLDIRECT in the last 72 hours. Thyroid Function Tests: No results for input(s): TSH, T4TOTAL, FREET4, T3FREE, THYROIDAB in the last 72 hours. Anemia Panel: No results for input(s): VITAMINB12, FOLATE, FERRITIN, TIBC, IRON, RETICCTPCT in the last 72 hours. Urine analysis:    Component Value Date/Time   COLORURINE YELLOW 04/06/2014 1030   APPEARANCEUR CLEAR 04/06/2014 1030   LABSPEC 1.020 04/06/2014 1030   PHURINE 8.0 04/06/2014 1030   GLUCOSEU 100 (A) 04/06/2014 1030   HGBUR MODERATE (A) 04/06/2014 1030   BILIRUBINUR NEGATIVE 04/06/2014 1030   KETONESUR NEGATIVE 04/06/2014 1030   PROTEINUR >300 (A) 04/06/2014 1030   UROBILINOGEN 0.2 04/06/2014 1030   NITRITE NEGATIVE 04/06/2014 1030   LEUKOCYTESUR NEGATIVE 04/06/2014 1030   Sepsis Labs: @LABRCNTIP (procalcitonin:4,lacticidven:4)  ) Recent Results (from the past 240 hour(s))  MRSA PCR Screening     Status: None   Collection Time: 09/13/16  4:07 AM  Result Value Ref Range Status   MRSA by PCR NEGATIVE NEGATIVE Final    Comment:        The GeneXpert MRSA Assay (FDA approved for NASAL specimens only), is one component of a comprehensive MRSA colonization surveillance program. It is not intended to diagnose MRSA infection nor to guide or monitor treatment for MRSA infections.          Radiology Studies: Dg Chest Port 1 View  Result Date: 09/18/2016 CLINICAL DATA:  Difficulty breathing. Coughing. Shortness of breath . EXAM: PORTABLE CHEST 1  VIEW COMPARISON:  09/16/2016. FINDINGS: Cardiomegaly with mild pulmonary venous congestion. Mild bilateral interstitial prominence. Findings suggest mild CHF. No pleural effusion or pneumothorax. No acute bony abnormality . IMPRESSION: Findings suggest mild congestive heart failure with mild interstitial edema . Electronically Signed   By: [place]   On: 09/18/2016 11:55        Scheduled Meds: . amLODipine  10 mg Oral Daily  . aspirin EC  81 mg Oral Daily  . calcitRIOL  0.25 mcg Oral  Q M,W,F-HD  . cholecalciferol  5,000 Units Oral Daily  . guaiFENesin  1,200 mg Oral BID  . heparin  5,000 Units Subcutaneous Q8H  . hydrALAZINE  50 mg Oral Q8H  . ipratropium-albuterol  3 mL Nebulization TID  . methylPREDNISolone (SOLU-MEDROL) injection  60 mg Intravenous Q6H  . metoprolol tartrate  50 mg Oral BID  . mometasone-formoterol  2 puff Inhalation BID  . pantoprazole  40 mg Oral Daily  . pravastatin  40 mg Oral QPM  . sevelamer carbonate  1,600 mg Oral TID WC  . sevelamer carbonate  800 mg Oral With snacks  . sodium chloride flush  3 mL Intravenous Q12H   Continuous Infusions: . sodium chloride    . sodium chloride    . sodium chloride    . sodium chloride    . sodium chloride       LOS: 5 days    Time spent: 30 minutes. Greater than 50% of this time was spent in direct contact with the patient coordinating care.     Lelon Frohlich, MD [facility] Pager [phone]  If 7PM-7AM, please contact night-coverage [email or web address] Password [facility] 09/18/2016, 4:24 PM

## 2016-09-18 NOTE — Progress Notes (Signed)
Sextremities normal, atraumatic, no cyanosis or edemaubjective: He said he had worsening problems with breathing this morning. He is still coughing. He says he still feels short of breath. He used CPAP again last night. Objective: Vital signs in last 24 hours: Temp:  [97.4 F (36.3 C)-98.4 F (36.9 C)] 97.5 F (36.4 C) (08/28 0400) Pulse Rate:  [56-87] 79 (08/28 0500) Resp:  [15-26] 22 (08/28 0500) BP: (138-165)/(50-77) 163/70 (08/28 0500) SpO2:  [86 %-98 %] 92 % (08/28 0536) Weight:  [100.3 kg (221 lb 1.9 oz)-102.5 kg (225 lb 15.5 oz)] 100.3 kg (221 lb 1.9 oz) (08/28 0500) Weight change: 0 kg (0 lb) Last BM Date: 09/17/16  Intake/Output from previous day: 08/27 0701 - 08/28 0700 In: 243 [P.O.:240; I.V.:3] Out: 3000   PHYSICAL EXAM General appearance: alert, cooperative and mild distress Resp: rhonchi bilaterally Cardio: regular rate and rhythm, S1, S2 normal, no murmur, click, rub or gallop GI: soft, non-tender; bowel sounds normal; no masses,  no organomegaly Extremities: extSkin warm and dryities normal, atraumatic, no cyanosis or edema Skin warm and dry  Lab Results:  Results for orders placed or performed during the hospital encounter of 09/12/16 (from the past 48 hour(s))  CBC with Differential/Platelet     Status: Abnormal   Collection Time: 09/17/16  8:46 AM  Result Value Ref Range   WBC 13.4 (H) 4.0 - 10.5 K/uL   RBC 3.42 (L) 4.22 - 5.81 MIL/uL   Hemoglobin 11.0 (L) 13.0 - 17.0 g/dL   HCT 32.9 (L) 39.0 - 52.0 %   MCV 96.2 78.0 - 100.0 fL   MCH 32.2 26.0 - 34.0 pg   MCHC 33.4 30.0 - 36.0 g/dL   RDW 15.8 (H) 11.5 - 15.5 %   Platelets 150 150 - 400 K/uL   Neutrophils Relative % 93 %   Neutro Abs 12.4 (H) 1.7 - 7.7 K/uL   Lymphocytes Relative 4 %   Lymphs Abs 0.6 (L) 0.7 - 4.0 K/uL   Monocytes Relative 3 %   Monocytes Absolute 0.4 0.1 - 1.0 K/uL   Eosinophils Relative 0 %   Eosinophils Absolute 0.0 0.0 - 0.7 K/uL   Basophils Relative 0 %   Basophils Absolute  0.0 0.0 - 0.1 K/uL  Renal function panel     Status: Abnormal   Collection Time: 09/17/16  8:46 AM  Result Value Ref Range   Sodium 130 (L) 135 - 145 mmol/L   Potassium 4.9 3.5 - 5.1 mmol/L   Chloride 90 (L) 101 - 111 mmol/L   CO2 16 (L) 22 - 32 mmol/L   Glucose, Bld 239 (H) 65 - 99 mg/dL   BUN 130 (H) 6 - 20 mg/dL    Comment: RESULTS CONFIRMED BY MANUAL DILUTION   Creatinine, Ser 15.77 (H) 0.61 - 1.24 mg/dL   Calcium 9.1 8.9 - 10.3 mg/dL   Phosphorus 7.0 (H) 2.5 - 4.6 mg/dL   Albumin 3.8 3.5 - 5.0 g/dL   GFR calc non Af Amer 3 (L) >60 mL/min   GFR calc Af Amer 3 (L) >60 mL/min    Comment: (NOTE) The eGFR has been calculated using the CKD EPI equation. This calculation has not been validated in all clinical situations. eGFR's persistently <60 mL/min signify possible Chronic Kidney Disease.    Anion gap 24 (H) 5 - 15    ABGS No results for input(s): PHART, PO2ART, TCO2, HCO3 in the last 72 hours.  Invalid input(s): PCO2 CULTURES Recent Results (from the past 240 hour(s))  MRSA PCR Screening     Status: None   Collection Time: 09/13/16  4:07 AM  Result Value Ref Range Status   MRSA by PCR NEGATIVE NEGATIVE Final    Comment:        The GeneXpert MRSA Assay (FDA approved for NASAL specimens only), is one component of a comprehensive MRSA colonization surveillance program. It is not intended to diagnose MRSA infection nor to guide or monitor treatment for MRSA infections.    Studies/Results: No results found.  Medications:  Prior to Admission:  Facility-Administered Medications Prior to Admission  Medication Dose Route Frequency Provider Last Rate Last Dose  . 0.9 %  sodium chloride infusion  500 mL Intravenous Continuous Milus Banister, MD       Prescriptions Prior to Admission  Medication Sig Dispense Refill Last Dose  . acetaminophen (TYLENOL) 500 MG tablet Take 1 tablet (500 mg total) by mouth every 6 (six) hours as needed for moderate pain.   09/12/2016 at  Unknown time  . amLODipine (NORVASC) 10 MG tablet One twice daily to control BP 180 tablet 3 09/12/2016 at Unknown time  . aspirin EC 81 MG tablet Take 81 mg by mouth daily.   09/12/2016 at Unknown time  . b complex-vitamin c-folic acid (NEPHRO-VITE) 0.8 MG TABS tablet TAKE 1 TABLET BY MOUTH EVERY DAY 90 tablet 1 09/12/2016 at Unknown time  . budesonide-formoterol (SYMBICORT) 80-4.5 MCG/ACT inhaler Inhale 2 puffs into the lungs 2 (two) times daily. 1 Inhaler 1 unknown  . calcitRIOL (ROCALTROL) 0.25 MCG capsule Take 1 capsule (0.25 mcg total) by mouth every Monday, Wednesday, and Friday with hemodialysis. 30 capsule 1 09/12/2016 at Unknown time  . Cholecalciferol (VITAMIN D3) 5000 UNITS TABS Take 1 tablet by mouth daily.   09/12/2016 at Unknown time  . dextromethorphan-guaiFENesin (MUCINEX DM) 30-600 MG 12hr tablet Take 1 tablet by mouth 2 (two) times daily.   09/12/2016 at Unknown time  . diphenhydrAMINE (BENADRYL) 25 MG tablet Take 50 mg by mouth 2 (two) times daily as needed for allergies.    09/12/2016 at Unknown time  . guaiFENesin-dextromethorphan (ROBITUSSIN DM) 100-10 MG/5ML syrup Take 10 mLs by mouth every 4 (four) hours as needed for cough.   09/12/2016 at Unknown time  . hydrALAZINE (APRESOLINE) 25 MG tablet One twice daily to regulate BP (Patient taking differently: Take 25 mg by mouth 3 (three) times daily. One twice daily to regulate BP) 180 tablet 3 09/12/2016 at Unknown time  . ipratropium-albuterol (DUONEB) 0.5-2.5 (3) MG/3ML SOLN Take 3 mLs by nebulization 3 (three) times daily. ICD 10 code J44.9 360 mL 0 09/12/2016 at Unknown time  . metoCLOPramide (REGLAN) 10 MG tablet One before each meal to increase stomach emptying (Patient taking differently: Take 10 mg by mouth 3 (three) times daily before meals. One before each meal to increase stomach emptying) 90 tablet 5 09/12/2016 at Unknown time  . metoprolol tartrate (LOPRESSOR) 50 MG tablet One twice daily to regulate BP 180 tablet 3 09/12/2016 at  2215  . omeprazole (PRILOSEC) 20 MG capsule TAKE 1 CAPSULE (20 MG TOTAL) BY MOUTH 2 (TWO) TIMES DAILY BEFORE A MEAL. 60 capsule 0 09/12/2016 at Unknown time  . pravastatin (PRAVACHOL) 40 MG tablet Take 1 tablet (40 mg total) by mouth every evening. 90 tablet 1 09/12/2016 at Unknown time  . promethazine (PHENERGAN) 25 MG tablet TAKE 1 TABLET BY MOUTH EVERY 8 HOURS AS NEEDED FOR NAUSEA AND VOMITING.. CAN FILL 08/10 90 tablet 1 unknown at Unknown time  .  traMADol (ULTRAM) 50 MG tablet One up to 4 times daly if needed for pain (Patient taking differently: Take 50 mg by mouth 4 (four) times daily as needed for moderate pain. One up to 4 times daly if needed for pain) 100 tablet 3 unknown   Scheduled: . amLODipine  10 mg Oral Daily  . aspirin EC  81 mg Oral Daily  . calcitRIOL  0.25 mcg Oral Q M,W,F-HD  . cholecalciferol  5,000 Units Oral Daily  . guaiFENesin  1,200 mg Oral BID  . heparin  5,000 Units Subcutaneous Q8H  . hydrALAZINE  50 mg Oral Q8H  . ipratropium-albuterol  3 mL Nebulization TID  . methylPREDNISolone (SOLU-MEDROL) injection  60 mg Intravenous Q6H  . metoprolol tartrate  50 mg Oral BID  . mometasone-formoterol  2 puff Inhalation BID  . pantoprazole  40 mg Oral Daily  . pravastatin  40 mg Oral QPM  . sevelamer carbonate  1,600 mg Oral TID WC  . sevelamer carbonate  800 mg Oral With snacks  . sodium chloride flush  3 mL Intravenous Q12H   Continuous: . sodium chloride    . sodium chloride    . sodium chloride    . sodium chloride    . sodium chloride     ZOX:WRUEAV chloride, sodium chloride, sodium chloride, sodium chloride, sodium chloride, acetaminophen, alum & mag hydroxide-simeth, diphenhydrAMINE, guaiFENesin-dextromethorphan, ipratropium-albuterol, lidocaine (PF), lidocaine-prilocaine, ondansetron **OR** ondansetron (ZOFRAN) IV, pentafluoroprop-tetrafluoroeth, sodium chloride flush, traMADol  Assesment: He has acute hypoxic respiratory failure which has required BiPAP. At  baseline he has obstructive sleep apnea and is on CPAP which he used last night. He had more trouble this morning. He has asthma at baseline which is being treated. He has end-stage renal disease on dialysis and was felt to be volume overloaded. Active Problems:   OSA on CPAP   ESRD on dialysis [facility])   Pulmonary edema    Plan: Continue treatments. Check chest x-ray to be sure since he had more trouble    LOS: 5 days   Cleo Villamizar L 09/18/2016, 8:29 AM

## 2016-09-18 NOTE — Plan of Care (Signed)
Problem: Health Behavior/Discharge Planning: Goal: Ability to manage health-related needs will improve Outcome: Completed/Met Date Met: 09/18/16 Patient and patient's wife very knowledgeable regarding patient's health conditions, patient reports that he attends doctors appts regularly as scheduled including dialysis 3xwk, patient reports he wants to quit smoking and understands that cessation of cigarette smoking is crucial for his health, patient has supportive family and is compliant with Rx medications

## 2016-09-18 NOTE — Progress Notes (Signed)
Inpatient Diabetes Program Recommendations  AACE/ADA: New Consensus Statement on Inpatient Glycemic Control (2015)  Target Ranges:  Prepandial:   less than 140 mg/dL      Peak postprandial:   less than 180 mg/dL (1-2 hours)      Critically ill patients:  140 - 180 mg/dL   Results for ODILON, CASS (MRN [id number]) as of 09/18/2016 07:48  Ref. Range 09/12/2016 23:38 09/13/2016 04:16 09/14/2016 04:59 09/16/2016 03:36 09/17/2016 08:46  Glucose Latest Ref Range: 65 - 99 mg/dL 172 (H) 184 (H) 134 (H) 180 (H) 239 (H)    Review of Glycemic Control  Diabetes history: No Outpatient Diabetes medications: NA Current orders for Inpatient glycemic control: None  Inpatient Diabetes Program Recommendations: Correction (SSI): While inpatient and ordered steroids, please consider ordering CBGs with Novolog correction scale ACHS. HgbA1C: Please consider ordering an A1C to evaluate glycemic control over the past 2-3 months.  Thanks, Barnie Alderman, RN, MSN, CDE Diabetes Coordinator Inpatient Diabetes Program [phone] (Team Pager from 8am to 5pm)

## 2016-09-18 NOTE — Progress Notes (Signed)
Patient c/o chest pain that is dull & "Just hangs out there" with pain radiating down LEFT arm. STAT 12 lead EKG obtained. EKG showed NSR, voltage criteria for LEFT ventricular hypertrophy & nonspecific ST abnormality. MD notified.

## 2016-09-18 NOTE — Progress Notes (Signed)
Subjective: Interval History: Complains of feeling hot when he is using C Pap. Presently he is using is on oxygen. He has episode of difficulty breathing with hypoxemia this morning. Presently he is feeling better. The cough is also improving.  Objective: Vital signs in last 24 hours: Temp:  [97.4 F (36.3 C)-98.4 F (36.9 C)] 97.5 F (36.4 C) (08/28 0400) Pulse Rate:  [56-87] 79 (08/28 0500) Resp:  [15-26] 22 (08/28 0500) BP: (138-165)/(50-93) 163/70 (08/28 0500) SpO2:  [86 %-98 %] 92 % (08/28 0536) Weight:  [100.3 kg (221 lb 1.9 oz)-102.5 kg (225 lb 15.5 oz)] 100.3 kg (221 lb 1.9 oz) (08/28 0500) Weight change: 0 kg (0 lb)  Intake/Output from previous day: 08/27 0701 - 08/28 0700 In: 243 [P.O.:240; I.V.:3] Out: 3000  Intake/Output this shift: No intake/output data recorded.  General appearance: alert, cooperative and no distress Resp: wheezes bibasilar and bilaterally Cardio: regular rate and rhythm Extremities: No edema  Lab Results:  Recent Labs  09/16/16 0336 09/17/16 0846  WBC 9.6 13.4*  HGB 11.2* 11.0*  HCT 34.4* 32.9*  PLT 155 150   BMET:   Recent Labs  09/16/16 0336 09/17/16 0846  NA 135 130*  K 4.8 4.9  CL 93* 90*  CO2 23 16*  GLUCOSE 180* 239*  BUN 83* 130*  CREATININE 12.53* 15.77*  CALCIUM 9.4 9.1   No results for input(s): PTH in the last 72 hours. Iron Studies: No results for input(s): IRON, TIBC, TRANSFERRIN, FERRITIN in the last 72 hours.  Studies/Results: No results found.  I have reviewed the patient's current medications.  Assessment/Plan: Problem #1 difficulty breathing: Possibly a combination of fluid overload and exacerbation of his asthma. Patient was dialyzed yesterday in 3 L was removed. He has episode of difficulty breathing this morning but presently he is feeling better with O2 saturation of 96%. Problem #2 end-stage renal disease: He is status post hemodialysis on Saturday. His potassium is normal. He doesn't have any  nausea or vomiting. Problem #3 bone and mineral disorder: His calcium is ray and phosphorus is very high. He is on a binder Problem #4 hypertension: His blood pressure is reasonably controlled Problem #5. Anemia: His hemoglobin remained within our target goal. Problem #6 history of obstructive sleep apnea Plan: 1]We'll dialyze patient tomorrow which is his regular schedule. 2] renal panel in the morning.   LOS: 5 days   Steffon Gladu S 09/18/2016,7:59 AM

## 2016-09-19 ENCOUNTER — Inpatient Hospital Stay (HOSPITAL_COMMUNITY): Payer: Medicare Other

## 2016-09-19 DIAGNOSIS — R0600 Dyspnea, unspecified: Secondary | ICD-10-CM

## 2016-09-19 LAB — RENAL FUNCTION PANEL
ALBUMIN: 3.6 g/dL (ref 3.5–5.0)
Anion gap: 19 — ABNORMAL HIGH (ref 5–15)
BUN: 130 mg/dL — AB (ref 6–20)
CALCIUM: 8.9 mg/dL (ref 8.9–10.3)
CO2: 21 mmol/L — ABNORMAL LOW (ref 22–32)
CREATININE: 14.14 mg/dL — AB (ref 0.61–1.24)
Chloride: 94 mmol/L — ABNORMAL LOW (ref 101–111)
GFR calc Af Amer: 4 mL/min — ABNORMAL LOW (ref >60)
GFR calc non Af Amer: 3 mL/min — ABNORMAL LOW (ref >60)
GLUCOSE: 246 mg/dL — AB (ref 65–99)
PHOSPHORUS: 6.7 mg/dL — AB (ref 2.5–4.6)
Potassium: 4.8 mmol/L (ref 3.5–5.1)
SODIUM: 134 mmol/L — AB (ref 135–145)

## 2016-09-19 LAB — CBC
HEMATOCRIT: 35.1 % — AB (ref 39.0–52.0)
Hemoglobin: 11.6 g/dL — ABNORMAL LOW (ref 13.0–17.0)
MCH: 32 pg (ref 26.0–34.0)
MCHC: 33 g/dL (ref 30.0–36.0)
MCV: 97 fL (ref 78.0–100.0)
PLATELETS: 168 10*3/uL (ref 150–400)
RBC: 3.62 MIL/uL — ABNORMAL LOW (ref 4.22–5.81)
RDW: 15.9 % — AB (ref 11.5–15.5)
WBC: 16.1 10*3/uL — ABNORMAL HIGH (ref 4.0–10.5)

## 2016-09-19 LAB — ECHOCARDIOGRAM COMPLETE
HEIGHTINCHES: 67 in
WEIGHTICAEL: 3629.65 [oz_av]

## 2016-09-19 MED ORDER — ALTEPLASE 2 MG IJ SOLR
2.0000 mg | Freq: Once | INTRAMUSCULAR | Status: DC | PRN
  Filled 2016-09-19: qty 2

## 2016-09-19 MED ORDER — HYDRALAZINE HCL 20 MG/ML IJ SOLN: 10.0000 mg | INTRAMUSCULAR | Status: DC | PRN

## 2016-09-19 MED ORDER — SODIUM CHLORIDE 0.9 % IV SOLN: 100.0000 mL | INTRAVENOUS | Status: DC | PRN

## 2016-09-19 MED ORDER — HEPARIN SODIUM (PORCINE) 1000 UNIT/ML DIALYSIS
1000.0000 [IU] | INTRAMUSCULAR | Status: DC | PRN
  Filled 2016-09-19: qty 1

## 2016-09-19 MED ORDER — CLONAZEPAM 0.5 MG PO TABS
0.5000 mg | ORAL_TABLET | Freq: Two times a day (BID) | ORAL | Status: DC
  Administered 2016-09-19 – 2016-09-22 (×7): 0.5 mg via ORAL
  Filled 2016-09-19 (×7): qty 1

## 2016-09-19 MED ORDER — FUROSEMIDE 10 MG/ML IJ SOLN
80.0000 mg | Freq: Once | INTRAMUSCULAR | Status: AC
  Administered 2016-09-19: 80 mg via INTRAVENOUS
  Filled 2016-09-19: qty 8

## 2016-09-19 MED ORDER — LORAZEPAM 2 MG/ML IJ SOLN
1.0000 mg | Freq: Once | INTRAMUSCULAR | Status: AC
  Administered 2016-09-19: 1 mg via INTRAVENOUS
  Filled 2016-09-19: qty 1

## 2016-09-19 MED ORDER — NITROGLYCERIN 2 % TD OINT
1.0000 [in_us] | TOPICAL_OINTMENT | Freq: Three times a day (TID) | TRANSDERMAL | Status: DC
  Administered 2016-09-19 – 2016-09-21 (×7): 1 [in_us] via TOPICAL
  Filled 2016-09-19 (×7): qty 1

## 2016-09-19 MED ORDER — DEXTROSE 5 % IV SOLN
1.0000 g | INTRAVENOUS | Status: DC
  Administered 2016-09-19 – 2016-09-20 (×2): 1 g via INTRAVENOUS
  Filled 2016-09-19 (×3): qty 10

## 2016-09-19 NOTE — Progress Notes (Signed)
Inpatient Diabetes Program Recommendations  AACE/ADA: New Consensus Statement on Inpatient Glycemic Control (2015)  Target Ranges:  Prepandial:   less than 140 mg/dL      Peak postprandial:   less than 180 mg/dL (1-2 hours)      Critically ill patients:  140 - 180 mg/dL   Results for BEARL, TALARICO (MRN [id number]) as of 09/19/2016 08:33  Ref. Range 09/13/2016 04:16 09/14/2016 04:59 09/16/2016 03:36 09/17/2016 08:46 09/19/2016 05:04  Glucose Latest Ref Range: 65 - 99 mg/dL 184 (H) 134 (H) 180 (H) 239 (H) 246 (H)    Review of Glycemic Control  Diabetes history: No Outpatient Diabetes medications: NA Current orders for Inpatient glycemic control: None  Inpatient Diabetes Program Recommendations: Correction (SSI): While inpatient and ordered steroids, please consider ordering CBGs with Novolog correction scale ACHS. HgbA1C: Please consider ordering an A1C to evaluate glycemic control over the past 2-3 months.  Thanks, Barnie Alderman, RN, MSN, CDE Diabetes Coordinator Inpatient Diabetes Program [phone] (Team Pager from 8am to 5pm)

## 2016-09-19 NOTE — Progress Notes (Signed)
EVEN BUDLONG  MRN: [id number]  DOB/AGE: 04-07-1960 56 y.o.  Primary Care Physician:Reed, Tiffany L, DO  Admit date: 09/12/2016  Chief Complaint:  Chief Complaint  Patient presents with  . Shortness of Breath    S-Pt presented on  09/12/2016 with  Chief Complaint  Patient presents with  . Shortness of Breath  . Pt offers no new complaints.   meds . amLODipine  10 mg Oral Daily  . aspirin EC  81 mg Oral Daily  . calcitRIOL  0.25 mcg Oral Q M,W,F-HD  . cholecalciferol  5,000 Units Oral Daily  . clonazePAM  0.5 mg Oral BID  . guaiFENesin  1,200 mg Oral BID  . heparin  5,000 Units Subcutaneous Q8H  . hydrALAZINE  50 mg Oral Q8H  . ipratropium-albuterol  3 mL Nebulization TID  . methylPREDNISolone (SOLU-MEDROL) injection  60 mg Intravenous Q6H  . metoprolol tartrate  50 mg Oral BID  . mometasone-formoterol  2 puff Inhalation BID  . nitroGLYCERIN  1 inch Topical Q8H  . pantoprazole  40 mg Oral Daily  . pravastatin  40 mg Oral QPM  . sevelamer carbonate  1,600 mg Oral TID WC  . sevelamer carbonate  800 mg Oral With snacks  . sodium chloride flush  3 mL Intravenous Q12H       Physical Exam: Vital signs in last 24 hours: Temp:  [97.5 F (36.4 C)-98.3 F (36.8 C)] 97.5 F (36.4 C) (08/29 0810) Pulse Rate:  [71-97] 85 (08/29 0830) Resp:  [20-30] 23 (08/29 0300) BP: (125-184)/(52-128) 170/87 (08/29 0830) SpO2:  [88 %-96 %] 88 % (08/29 0830) Weight:  [226 lb 13.7 oz (102.9 kg)] 226 lb 13.7 oz (102.9 kg) (08/29 0400) Weight change: 14.1 oz (0.4 kg) Last BM Date: 09/19/16  Intake/Output from previous day: 08/28 0701 - 08/29 0700 In: 240 [P.O.:240] Out: -  Total I/O In: 1339 [P.O.:1289; IV Piggyback:50] Out: 200 [Urine:200]   Physical Exam: General- pt is awake,alert, oriented to time place and person HEENT- Bipap in situ Resp- No acute REsp distress,  Minimal ronchi   CVS- S1S2 regular ij rate and rhythm GIT- BS+, soft, NT, ND EXT- NO LE Edema,  Cyanosis Access- left AVF  Lab Results: CBC  Recent Labs  09/17/16 0846 09/19/16 0504  WBC 13.4* 16.1*  HGB 11.0* 11.6*  HCT 32.9* 35.1*  PLT 150 168    BMET  Recent Labs  09/17/16 0846 09/19/16 0504  NA 130* 134*  K 4.9 4.8  CL 90* 94*  CO2 16* 21*  GLUCOSE 239* 246*  BUN 130* 130*  CREATININE 15.77* 14.14*  CALCIUM 9.1 8.9    MICRO Recent Results (from the past 240 hour(s))  MRSA PCR Screening     Status: None   Collection Time: 09/13/16  4:07 AM  Result Value Ref Range Status   MRSA by PCR NEGATIVE NEGATIVE Final    Comment:        The GeneXpert MRSA Assay (FDA approved for NASAL specimens only), is one component of a comprehensive MRSA colonization surveillance program. It is not intended to diagnose MRSA infection nor to guide or monitor treatment for MRSA infections.       Lab Results  Component Value Date   PTH 429 04/30/2015   CALCIUM 8.9 09/19/2016   CAION 1.02 (L) 09/08/2013   PHOS 6.7 (H) 09/19/2016       Impression: 1)Renal  ESRD on Hd  Pt is On Monday/Wednesday/Friday schedule               Pt will be dialyzed today               Pt also has Hx of failed transplant  2)HTN  Medication- On Calcium Channel Blockers On Alpha and beta Blockers   3)Anemia In ESRD the goal for hgb ia 9--11    Pt hgb is at goal   4)CKD Mineral-Bone Disorder Secondary Hyperparathyroidism present  Phosphorus not at goal.   5)Resp  admitted with pulmonary edema Now better Primary MD following  6)Electrolytes Normokalemic NOrmonatremic   7)Acid base Co2 at goal      Plan:  Will continue current care. Will dialyze today.  Addendum Pt seen on dialysis  Pt tolerating tx well.    Kym Scannell S 09/19/2016, 9:52 AM

## 2016-09-19 NOTE — Progress Notes (Signed)
Subjective: He says he feels okay but he had more trouble with shortness of breath this morning. He also had a panic attack this morning. He has no other new complaints.   Objective: Vital signs in last 24 hours: Temp:  [97.5 F (36.4 C)-98.3 F (36.8 C)] 97.5 F (36.4 C) (08/29 0810) Pulse Rate:  [71-97] 84 (08/29 0800) Resp:  [20-30] 23 (08/29 0300) BP: (125-184)/(52-128) 146/128 (08/29 0800) SpO2:  [89 %-97 %] 89 % (08/29 0800) Weight:  [102.9 kg (226 lb 13.7 oz)] 102.9 kg (226 lb 13.7 oz) (08/29 0400) Weight change: 0.4 kg (14.1 oz) Last BM Date: 09/18/16  Intake/Output from previous day: 08/28 0701 - 08/29 0700 In: 240 [P.O.:240] Out: -   PHYSICAL EXAM General appearance: alert, cooperative and mild distress Resp: rhonchi bilaterally and wheezes bilaterally Cardio: regular rate and rhythm, S1, S2 normal, no murmur, click, rub or gallop GI: soft, non-tender; bowel sounds normal; no masses,  no organomegaly Extremities: extremities normal, atraumatic, no cyanosis or edema Skin warm and dry  Lab Results:  Results for orders placed or performed during the hospital encounter of 09/12/16 (from the past 48 hour(s))  CBC with Differential/Platelet     Status: Abnormal   Collection Time: 09/17/16  8:46 AM  Result Value Ref Range   WBC 13.4 (H) 4.0 - 10.5 K/uL   RBC 3.42 (L) 4.22 - 5.81 MIL/uL   Hemoglobin 11.0 (L) 13.0 - 17.0 g/dL   HCT 32.9 (L) 39.0 - 52.0 %   MCV 96.2 78.0 - 100.0 fL   MCH 32.2 26.0 - 34.0 pg   MCHC 33.4 30.0 - 36.0 g/dL   RDW 15.8 (H) 11.5 - 15.5 %   Platelets 150 150 - 400 K/uL   Neutrophils Relative % 93 %   Neutro Abs 12.4 (H) 1.7 - 7.7 K/uL   Lymphocytes Relative 4 %   Lymphs Abs 0.6 (L) 0.7 - 4.0 K/uL   Monocytes Relative 3 %   Monocytes Absolute 0.4 0.1 - 1.0 K/uL   Eosinophils Relative 0 %   Eosinophils Absolute 0.0 0.0 - 0.7 K/uL   Basophils Relative 0 %   Basophils Absolute 0.0 0.0 - 0.1 K/uL  Renal function panel     Status: Abnormal    Collection Time: 09/17/16  8:46 AM  Result Value Ref Range   Sodium 130 (L) 135 - 145 mmol/L   Potassium 4.9 3.5 - 5.1 mmol/L   Chloride 90 (L) 101 - 111 mmol/L   CO2 16 (L) 22 - 32 mmol/L   Glucose, Bld 239 (H) 65 - 99 mg/dL   BUN 130 (H) 6 - 20 mg/dL    Comment: RESULTS CONFIRMED BY MANUAL DILUTION   Creatinine, Ser 15.77 (H) 0.61 - 1.24 mg/dL   Calcium 9.1 8.9 - 10.3 mg/dL   Phosphorus 7.0 (H) 2.5 - 4.6 mg/dL   Albumin 3.8 3.5 - 5.0 g/dL   GFR calc non Af Amer 3 (L) >60 mL/min   GFR calc Af Amer 3 (L) >60 mL/min    Comment: (NOTE) The eGFR has been calculated using the CKD EPI equation. This calculation has not been validated in all clinical situations. eGFR's persistently <60 mL/min signify possible Chronic Kidney Disease.    Anion gap 24 (H) 5 - 15  Renal function panel     Status: Abnormal   Collection Time: 09/19/16  5:04 AM  Result Value Ref Range   Sodium 134 (L) 135 - 145 mmol/L   Potassium 4.8 3.5 -  5.1 mmol/L   Chloride 94 (L) 101 - 111 mmol/L   CO2 21 (L) 22 - 32 mmol/L   Glucose, Bld 246 (H) 65 - 99 mg/dL   BUN 062 (H) 6 - 20 mg/dL    Comment: RESULTS CONFIRMED BY MANUAL DILUTION   Creatinine, Ser 14.14 (H) 0.61 - 1.24 mg/dL   Calcium 8.9 8.9 - 06.7 mg/dL   Phosphorus 6.7 (H) 2.5 - 4.6 mg/dL   Albumin 3.6 3.5 - 5.0 g/dL   GFR calc non Af Amer 3 (L) >60 mL/min   GFR calc Af Amer 4 (L) >60 mL/min    Comment: (NOTE) The eGFR has been calculated using the CKD EPI equation. This calculation has not been validated in all clinical situations. eGFR's persistently <60 mL/min signify possible Chronic Kidney Disease.    Anion gap 19 (H) 5 - 15  CBC     Status: Abnormal   Collection Time: 09/19/16  5:04 AM  Result Value Ref Range   WBC 16.1 (H) 4.0 - 10.5 K/uL   RBC 3.62 (L) 4.22 - 5.81 MIL/uL   Hemoglobin 11.6 (L) 13.0 - 17.0 g/dL   HCT 94.0 (L) 18.9 - 92.1 %   MCV 97.0 78.0 - 100.0 fL   MCH 32.0 26.0 - 34.0 pg   MCHC 33.0 30.0 - 36.0 g/dL   RDW 23.5 (H) 62.6  - 15.5 %   Platelets 168 150 - 400 K/uL    ABGS No results for input(s): PHART, PO2ART, TCO2, HCO3 in the last 72 hours.  Invalid input(s): PCO2 CULTURES Recent Results (from the past 240 hour(s))  MRSA PCR Screening     Status: None   Collection Time: 09/13/16  4:07 AM  Result Value Ref Range Status   MRSA by PCR NEGATIVE NEGATIVE Final    Comment:        The GeneXpert MRSA Assay (FDA approved for NASAL specimens only), is one component of a comprehensive MRSA colonization surveillance program. It is not intended to diagnose MRSA infection nor to guide or monitor treatment for MRSA infections.    Studies/Results: Dg Chest Port 1 View  Result Date: 09/18/2016 CLINICAL DATA:  Shortness of breath and cough tonight. EXAM: PORTABLE CHEST 1 VIEW COMPARISON:  September 18, 2016 6:11 p.m. FINDINGS: The mediastinal contour is normal. The heart size is enlarged. There is mild interstitial edema. There is no focal pneumonia or pleural effusion. The osseous structures are stable. IMPRESSION: Mild congestive heart failure. Electronically Signed   By: Sherian Rein M.D.   On: 09/18/2016 20:10   Dg Chest Port 1 View  Result Date: 09/18/2016 CLINICAL DATA:  Difficulty breathing. Coughing. Shortness of breath . EXAM: PORTABLE CHEST 1 VIEW COMPARISON:  09/16/2016. FINDINGS: Cardiomegaly with mild pulmonary venous congestion. Mild bilateral interstitial prominence. Findings suggest mild CHF. No pleural effusion or pneumothorax. No acute bony abnormality . IMPRESSION: Findings suggest mild congestive heart failure with mild interstitial edema . Electronically Signed   By: Maisie Fus  Register   On: 09/18/2016 11:55    Medications:  Prior to Admission:  Facility-Administered Medications Prior to Admission  Medication Dose Route Frequency Provider Last Rate Last Dose  . 0.9 %  sodium chloride infusion  500 mL Intravenous Continuous Rachael Fee, MD       Prescriptions Prior to Admission   Medication Sig Dispense Refill Last Dose  . acetaminophen (TYLENOL) 500 MG tablet Take 1 tablet (500 mg total) by mouth every 6 (six) hours as needed for moderate pain.  09/12/2016 at Unknown time  . amLODipine (NORVASC) 10 MG tablet One twice daily to control BP 180 tablet 3 09/12/2016 at Unknown time  . aspirin EC 81 MG tablet Take 81 mg by mouth daily.   09/12/2016 at Unknown time  . b complex-vitamin c-folic acid (NEPHRO-VITE) 0.8 MG TABS tablet TAKE 1 TABLET BY MOUTH EVERY DAY 90 tablet 1 09/12/2016 at Unknown time  . budesonide-formoterol (SYMBICORT) 80-4.5 MCG/ACT inhaler Inhale 2 puffs into the lungs 2 (two) times daily. 1 Inhaler 1 unknown  . calcitRIOL (ROCALTROL) 0.25 MCG capsule Take 1 capsule (0.25 mcg total) by mouth every Monday, Wednesday, and Friday with hemodialysis. 30 capsule 1 09/12/2016 at Unknown time  . Cholecalciferol (VITAMIN D3) 5000 UNITS TABS Take 1 tablet by mouth daily.   09/12/2016 at Unknown time  . dextromethorphan-guaiFENesin (MUCINEX DM) 30-600 MG 12hr tablet Take 1 tablet by mouth 2 (two) times daily.   09/12/2016 at Unknown time  . diphenhydrAMINE (BENADRYL) 25 MG tablet Take 50 mg by mouth 2 (two) times daily as needed for allergies.    09/12/2016 at Unknown time  . guaiFENesin-dextromethorphan (ROBITUSSIN DM) 100-10 MG/5ML syrup Take 10 mLs by mouth every 4 (four) hours as needed for cough.   09/12/2016 at Unknown time  . hydrALAZINE (APRESOLINE) 25 MG tablet One twice daily to regulate BP (Patient taking differently: Take 25 mg by mouth 3 (three) times daily. One twice daily to regulate BP) 180 tablet 3 09/12/2016 at Unknown time  . ipratropium-albuterol (DUONEB) 0.5-2.5 (3) MG/3ML SOLN Take 3 mLs by nebulization 3 (three) times daily. ICD 10 code J44.9 360 mL 0 09/12/2016 at Unknown time  . metoCLOPramide (REGLAN) 10 MG tablet One before each meal to increase stomach emptying (Patient taking differently: Take 10 mg by mouth 3 (three) times daily before meals. One before  each meal to increase stomach emptying) 90 tablet 5 09/12/2016 at Unknown time  . metoprolol tartrate (LOPRESSOR) 50 MG tablet One twice daily to regulate BP 180 tablet 3 09/12/2016 at 2215  . omeprazole (PRILOSEC) 20 MG capsule TAKE 1 CAPSULE (20 MG TOTAL) BY MOUTH 2 (TWO) TIMES DAILY BEFORE A MEAL. 60 capsule 0 09/12/2016 at Unknown time  . pravastatin (PRAVACHOL) 40 MG tablet Take 1 tablet (40 mg total) by mouth every evening. 90 tablet 1 09/12/2016 at Unknown time  . promethazine (PHENERGAN) 25 MG tablet TAKE 1 TABLET BY MOUTH EVERY 8 HOURS AS NEEDED FOR NAUSEA AND VOMITING.. CAN FILL 08/10 90 tablet 1 unknown at Unknown time  . traMADol (ULTRAM) 50 MG tablet One up to 4 times daly if needed for pain (Patient taking differently: Take 50 mg by mouth 4 (four) times daily as needed for moderate pain. One up to 4 times daly if needed for pain) 100 tablet 3 unknown   Scheduled: . amLODipine  10 mg Oral Daily  . aspirin EC  81 mg Oral Daily  . calcitRIOL  0.25 mcg Oral Q M,W,F-HD  . cholecalciferol  5,000 Units Oral Daily  . clonazePAM  0.5 mg Oral BID  . furosemide  80 mg Intravenous Once  . guaiFENesin  1,200 mg Oral BID  . heparin  5,000 Units Subcutaneous Q8H  . hydrALAZINE  50 mg Oral Q8H  . ipratropium-albuterol  3 mL Nebulization TID  . methylPREDNISolone (SOLU-MEDROL) injection  60 mg Intravenous Q6H  . metoprolol tartrate  50 mg Oral BID  . mometasone-formoterol  2 puff Inhalation BID  . nitroGLYCERIN  1 inch Topical Q8H  .  pantoprazole  40 mg Oral Daily  . pravastatin  40 mg Oral QPM  . sevelamer carbonate  1,600 mg Oral TID WC  . sevelamer carbonate  800 mg Oral With snacks  . sodium chloride flush  3 mL Intravenous Q12H   Continuous: . sodium chloride    . sodium chloride    . sodium chloride    . sodium chloride    . sodium chloride    . cefTRIAXone (ROCEPHIN)  IV     KIC:HTVGVS chloride, sodium chloride, sodium chloride, sodium chloride, sodium chloride, acetaminophen,  alum & mag hydroxide-simeth, diphenhydrAMINE, gi cocktail, guaiFENesin-dextromethorphan, hydrALAZINE, ipratropium-albuterol, lidocaine (PF), lidocaine-prilocaine, ondansetron **OR** ondansetron (ZOFRAN) IV, pentafluoroprop-tetrafluoroeth, simethicone, sodium chloride flush, traMADol  Assesment: He was admitted with pulmonary edema. He has end-stage renal disease on dialysis. He has asthma at baseline and has had an asthma exacerbation. He also has obstructive sleep apnea and he is on CPAP at home. Active Problems:   OSA on CPAP   ESRD on dialysis [facility])   Pulmonary edema    Plan: Go ahead and treat him with antibiotics since he's really not clearing up much. Okay to put him on medication for anxiety    LOS: 6 days   Chemika Nightengale L 09/19/2016, 8:21 AM

## 2016-09-19 NOTE — Plan of Care (Signed)
Problem: Tissue Perfusion: Goal: Risk factors for ineffective tissue perfusion will decrease Outcome: Progressing B/p starting to come under control/

## 2016-09-19 NOTE — Progress Notes (Signed)
PROGRESS NOTE    Kirk [name]  XKG:[id number] DOB: [birth year] DOA: 09/12/2016 PCP: Gayland Curry, DO    Brief Narrative: 56 yo admitted 8/23 for SOB.  He has ESRD on HD, asthma getting nebs and IV steroids, having vascular congestion and found to have anxiety.    Assessment & Plan:   Active Problems:   OSA on CPAP   ESRD on dialysis [facility])   Pulmonary edema   1. ESRD on HD:  I suspect he is still volume overload.  Continue with HD, perhaps he can get more fluid off with ultrafiltration.  Will give 60mg  IV lasix x 1 now, and start on NTP.  Will obtain ECHO. 2. Asthma:  He is not wheezing significantly, and I suspect pulmonary edema. Will try to d/c steroid as soon as possilbe. 3. Anxiety:  Spoke with Dr Luan Pulling.  Will give him low dose Klonopin. 4. HTN:  Continue with meds.  Give IV Hydralazine PRN. To avoid hypertensive pulmonary edema.    DVT prophylaxis: subQ Heparin. Code Status: FULL CODE.  Family Communication: wife Mechele Claude at bedside.  Disposition Plan: Home.   Consultants:   Pulmonary.   Nephrology.   Procedures:   None.   Antimicrobials: Anti-infectives    Start     Dose/Rate Route Frequency Ordered Stop   09/19/16 0900  cefTRIAXone (ROCEPHIN) 1 g in dextrose 5 % 50 mL IVPB     1 g 100 mL/hr over 30 Minutes Intravenous Every 24 hours 09/19/16 0740         Subjective:   Still SOB.  Had a  "panic attack"  this morning.   Objective: Vitals:   09/19/16 0300 09/19/16 0343 09/19/16 0400 09/19/16 0724  BP: (!) 160/70     Pulse: 77     Resp: (!) 23     Temp:   98.2 F (36.8 C)   TempSrc:   Axillary   SpO2: 90% (!) 89%  94%  Weight:   102.9 kg (226 lb 13.7 oz)   Height:        Intake/Output Summary (Last 24 hours) at 09/19/16 0759 Last data filed at 09/18/16 1829  Gross per 24 hour  Intake              240 ml  Output                0 ml  Net              240 ml   Filed Weights   09/17/16 0830 09/18/16 0500 09/19/16 0400  Weight: 102.5 kg (225  lb 15.5 oz) 100.3 kg (221 lb 1.9 oz) 102.9 kg (226 lb 13.7 oz)    Examination:  General exam: Appears calm and comfortable  Respiratory system: Wheezing slightly.  Much decreased BS.  Cardiovascular system: S1 & S2 heard, RRR. No JVD, murmurs, rubs, gallops or clicks. No pedal edema. Gastrointestinal system: Abdomen is nondistended, soft and nontender. No organomegaly or masses felt. Normal bowel sounds heard. Central nervous system: Alert and oriented. No focal neurological deficits. Extremities: Symmetric 5 x 5 power. Skin: No rashes, lesions or ulcers Psychiatry: Judgement and insight appear normal. Mood & affect appropriate.   Data Reviewed: I have personally reviewed following labs and imaging studies  CBC:  Recent Labs Lab 09/12/16 2338 09/13/16 0416 09/14/16 0459 09/16/16 0336 09/17/16 0846 09/19/16 0504  WBC 10.0 9.8 6.0 9.6 13.4* 16.1*  NEUTROABS 8.9*  --   --   --  12.4*  --  HGB 11.2* 10.2* 10.8* 11.2* 11.0* 11.6*  HCT 34.6* 31.1* 33.4* 34.4* 32.9* 35.1*  MCV 97.5 96.9 98.2 96.9 96.2 97.0  PLT 117* 105* 119* 155 150 767   Basic Metabolic Panel:  Recent Labs Lab 09/13/16 0416 09/14/16 0459 09/16/16 0336 09/17/16 0846 09/19/16 0504  NA 137 136 135 130* 134*  K 3.7 3.9 4.8 4.9 4.8  CL 97* 93* 93* 90* 94*  CO2 24 26 23  16* 21*  GLUCOSE 184* 134* 180* 239* 246*  BUN 50* 50* 83* 130* 130*  CREATININE 9.23* 9.70* 12.53* 15.77* 14.14*  CALCIUM 8.6* 8.8* 9.4 9.1 8.9  PHOS  --  8.2*  --  7.0* 6.7*   GFR: Estimated Creatinine Clearance: 6.8 mL/min (A) (by C-G formula based on SCr of 14.14 mg/dL (H)). Liver Function Tests:  Recent Labs Lab 09/12/16 2338 09/13/16 0416 09/14/16 0459 09/17/16 0846 09/19/16 0504  AST 21 22  --   --   --   ALT 18 18  --   --   --   ALKPHOS 76 71  --   --   --   BILITOT 0.9 1.0  --   --   --   PROT 8.0 7.4  --   --   --   ALBUMIN 4.2 3.9 4.0 3.8 3.6    Recent Labs Lab 09/12/16 2338 09/13/16 0416 09/13/16 0939  09/13/16 1538  TROPONINI <0.03 <0.03 <0.03 <0.03     Recent Results (from the past 240 hour(s))  MRSA PCR Screening     Status: None   Collection Time: 09/13/16  4:07 AM  Result Value Ref Range Status   MRSA by PCR NEGATIVE NEGATIVE Final    Comment:        The GeneXpert MRSA Assay (FDA approved for NASAL specimens only), is one component of a comprehensive MRSA colonization surveillance program. It is not intended to diagnose MRSA infection nor to guide or monitor treatment for MRSA infections.      Radiology Studies: Dg Chest Port 1 View  Result Date: 09/18/2016 CLINICAL DATA:  Shortness of breath and cough tonight. EXAM: PORTABLE CHEST 1 VIEW COMPARISON:  September 18, 2016 6:11 p.m. FINDINGS: The mediastinal contour is normal. The heart size is enlarged. There is mild interstitial edema. There is no focal pneumonia or pleural effusion. The osseous structures are stable. IMPRESSION: Mild congestive heart failure. Electronically Signed   By: Abelardo Diesel M.D.   On: 09/18/2016 20:10   Dg Chest Port 1 View  Result Date: 09/18/2016 CLINICAL DATA:  Difficulty breathing. Coughing. Shortness of breath . EXAM: PORTABLE CHEST 1 VIEW COMPARISON:  09/16/2016. FINDINGS: Cardiomegaly with mild pulmonary venous congestion. Mild bilateral interstitial prominence. Findings suggest mild CHF. No pleural effusion or pneumothorax. No acute bony abnormality . IMPRESSION: Findings suggest mild congestive heart failure with mild interstitial edema . Electronically Signed   By: [place]   On: 09/18/2016 11:55    Scheduled Meds: . amLODipine  10 mg Oral Daily  . aspirin EC  81 mg Oral Daily  . calcitRIOL  0.25 mcg Oral Q M,W,F-HD  . cholecalciferol  5,000 Units Oral Daily  . clonazePAM  0.5 mg Oral BID  . furosemide  80 mg Intravenous Once  . guaiFENesin  1,200 mg Oral BID  . heparin  5,000 Units Subcutaneous Q8H  . hydrALAZINE  50 mg Oral Q8H  . ipratropium-albuterol  3 mL Nebulization  TID  . methylPREDNISolone (SOLU-MEDROL) injection  60 mg Intravenous Q6H  .  metoprolol tartrate  50 mg Oral BID  . mometasone-formoterol  2 puff Inhalation BID  . nitroGLYCERIN  1 inch Topical Q8H  . pantoprazole  40 mg Oral Daily  . pravastatin  40 mg Oral QPM  . sevelamer carbonate  1,600 mg Oral TID WC  . sevelamer carbonate  800 mg Oral With snacks  . sodium chloride flush  3 mL Intravenous Q12H   Continuous Infusions: . sodium chloride    . sodium chloride    . sodium chloride    . sodium chloride    . sodium chloride    . cefTRIAXone (ROCEPHIN)  IV       LOS: 6 days   Bookert Guzzi, MD FACP Hospitalist.   If 7PM-7AM, please contact night-coverage [email or web address] Password [email or web address] 09/19/2016, 7:59 AM

## 2016-09-19 NOTE — Progress Notes (Signed)
*  PRELIMINARY RESULTS* Echocardiogram 2D Echocardiogram has been performed.  Kirk [name] 09/19/2016, 3:31 PM

## 2016-09-20 DIAGNOSIS — J9621 Acute and chronic respiratory failure with hypoxia: Secondary | ICD-10-CM

## 2016-09-20 MED ORDER — METOPROLOL TARTRATE 50 MG PO TABS
75.0000 mg | ORAL_TABLET | Freq: Two times a day (BID) | ORAL | Status: DC
  Administered 2016-09-20 – 2016-09-22 (×5): 75 mg via ORAL
  Filled 2016-09-20 (×5): qty 1

## 2016-09-20 NOTE — Progress Notes (Signed)
PROGRESS NOTE    Kirk [name]  PXT:[id number] DOB: 1960-10-15 DOA: 09/12/2016 PCP: Gayland Curry, DO    Brief Narrative:    56 yo admitted 8/23 for SOB.  He has ESRD on HD, asthma getting nebs and IV steroids, having vascular congestion and found to have anxiety. He was given PRN IV Hydralazine for BP, Klonopin for anxiety, and NTP to decrease his pre-load.  He felt much better today.   Assessment & Plan:   Active Problems:   OSA on CPAP   ESRD on dialysis [facility])   Pulmonary edema  1. ESRD on HD:  He is better today with volume status.  Wheezing has improved markedly.  Continue with HD per nephrology.  ECHO was OK, with EF 55 to 60%. 2. Asthma:  He is not wheezing significantly, and I suspect pulmonary edema. D/C steroid today.  3. Anxiety:  Spoke with Dr Luan Pulling.  Klonopin has helped.  Will continue.  4. HTN:  Continue with meds.  Give IV Hydralazine PRN. To avoid hypertensive pulmonary edema.  Increase Lopressor to 75mg  BID>   DVT prophylaxis: Lovenox.  Code Status: FULL CODE.  Family Communication: Wife Mechele Claude and daughter at bedside.  Disposition Plan: Home.  Consultants:   Pulmonary.   Procedures:   None.   Antimicrobials: Anti-infectives    Start     Dose/Rate Route Frequency Ordered Stop   09/19/16 0900  cefTRIAXone (ROCEPHIN) 1 g in dextrose 5 % 50 mL IVPB     1 g 100 mL/hr over 30 Minutes Intravenous Every 24 hours 09/19/16 0740         Subjective:  Feel better today.    Objective: Vitals:   09/20/16 1000 09/20/16 1030 09/20/16 1100 09/20/16 1200  BP: (!) 143/63  (!) 155/75   Pulse: 72 71 79   Resp:      Temp:    98.3 F (36.8 C)  TempSrc:    Axillary  SpO2: 95% 94% 98%   Weight:      Height:        Intake/Output Summary (Last 24 hours) at 09/20/16 1426 Last data filed at 09/20/16 0919  Gross per 24 hour  Intake             1230 ml  Output                0 ml  Net             1230 ml   Filed Weights   09/19/16 0400 09/19/16 0930  09/20/16 0500  Weight: 102.9 kg (226 lb 13.7 oz) 102.9 kg (226 lb 13.7 oz) 103.1 kg (227 lb 4.7 oz)    Examination:  General exam: Appears calm and comfortable  Respiratory system: Clear to auscultation. Respiratory effort normal. Cardiovascular system: S1 & S2 heard, RRR. No JVD, murmurs, rubs, gallops or clicks. No pedal edema. Gastrointestinal system: Abdomen is nondistended, soft and nontender. No organomegaly or masses felt. Normal bowel sounds heard. Central nervous system: Alert and oriented. No focal neurological deficits. Extremities: Symmetric 5 x 5 power. Skin: No rashes, lesions or ulcers Psychiatry: Judgement and insight appear normal. Mood & affect appropriate.   Data Reviewed: I have personally reviewed following labs and imaging studies  CBC:  Recent Labs Lab 09/14/16 0459 09/16/16 0336 09/17/16 0846 09/19/16 0504  WBC 6.0 9.6 13.4* 16.1*  NEUTROABS  --   --  12.4*  --   HGB 10.8* 11.2* 11.0* 11.6*  HCT 33.4* 34.4* 32.9*  35.1*  MCV 98.2 96.9 96.2 97.0  PLT 119* 155 150 497   Basic Metabolic Panel:  Recent Labs Lab 09/14/16 0459 09/16/16 0336 09/17/16 0846 09/19/16 0504  NA 136 135 130* 134*  K 3.9 4.8 4.9 4.8  CL 93* 93* 90* 94*  CO2 26 23 16* 21*  GLUCOSE 134* 180* 239* 246*  BUN 50* 83* 130* 130*  CREATININE 9.70* 12.53* 15.77* 14.14*  CALCIUM 8.8* 9.4 9.1 8.9  PHOS 8.2*  --  7.0* 6.7*   GFR: Estimated Creatinine Clearance: 6.8 mL/min (A) (by C-G formula based on SCr of 14.14 mg/dL (H)). Liver Function Tests:  Recent Labs Lab 09/14/16 0459 09/17/16 0846 09/19/16 0504  ALBUMIN 4.0 3.8 3.6   Cardiac Enzymes:  Recent Labs Lab 09/13/16 1538  TROPONINI <0.03   BNP (last 3 results)  Recent Results (from the past 240 hour(s))  MRSA PCR Screening     Status: None   Collection Time: 09/13/16  4:07 AM  Result Value Ref Range Status   MRSA by PCR NEGATIVE NEGATIVE Final    Comment:        The GeneXpert MRSA Assay (FDA approved for  NASAL specimens only), is one component of a comprehensive MRSA colonization surveillance program. It is not intended to diagnose MRSA infection nor to guide or monitor treatment for MRSA infections.      Radiology Studies: Dg Chest Port 1 View  Result Date: 09/18/2016 CLINICAL DATA:  Shortness of breath and cough tonight. EXAM: PORTABLE CHEST 1 VIEW COMPARISON:  September 18, 2016 6:11 p.m. FINDINGS: The mediastinal contour is normal. The heart size is enlarged. There is mild interstitial edema. There is no focal pneumonia or pleural effusion. The osseous structures are stable. IMPRESSION: Mild congestive heart failure. Electronically Signed   By: Abelardo Diesel M.D.   On: 09/18/2016 20:10    Scheduled Meds: . amLODipine  10 mg Oral Daily  . aspirin EC  81 mg Oral Daily  . calcitRIOL  0.25 mcg Oral Q M,W,F-HD  . cholecalciferol  5,000 Units Oral Daily  . clonazePAM  0.5 mg Oral BID  . guaiFENesin  1,200 mg Oral BID  . heparin  5,000 Units Subcutaneous Q8H  . hydrALAZINE  50 mg Oral Q8H  . ipratropium-albuterol  3 mL Nebulization TID  . metoprolol tartrate  75 mg Oral BID  . mometasone-formoterol  2 puff Inhalation BID  . nitroGLYCERIN  1 inch Topical Q8H  . pantoprazole  40 mg Oral Daily  . pravastatin  40 mg Oral QPM  . sevelamer carbonate  1,600 mg Oral TID WC  . sevelamer carbonate  800 mg Oral With snacks  . sodium chloride flush  3 mL Intravenous Q12H   Continuous Infusions: . sodium chloride    . sodium chloride    . sodium chloride    . sodium chloride    . sodium chloride    . sodium chloride    . sodium chloride    . cefTRIAXone (ROCEPHIN)  IV Stopped (09/20/16 0919)     LOS: 7 days   Vincent Ehrler, MD FACP Hospitalist.   If 7PM-7AM, please contact night-coverage [email or web address] Password [email or web address] 09/20/2016, 2:26 PM

## 2016-09-20 NOTE — Plan of Care (Signed)
Problem: Activity: Goal: Risk for activity intolerance will decrease Outcome: Progressing Patient still becomes severely SOB with movement. Frequent rest periods necessary.

## 2016-09-20 NOTE — Progress Notes (Signed)
Subjective: He says he feels better. He did better yesterday and last night. He still has some trouble with his anxiety but that does seem better with the addition of anxiolytic yesterday. He's moving air better. He's not coughing as much.  Objective: Vital signs in last 24 hours: Temp:  [97.5 F (36.4 C)-98.8 F (37.1 C)] 98.8 F (37.1 C) (08/30 0400) Pulse Rate:  [61-93] 68 (08/30 0600) Resp:  [17-25] 21 (08/30 0600) BP: (138-180)/(57-128) 164/78 (08/30 0600) SpO2:  [87 %-98 %] 97 % (08/30 0600) Weight:  [102.9 kg (226 lb 13.7 oz)-103.1 kg (227 lb 4.7 oz)] 103.1 kg (227 lb 4.7 oz) (08/30 0500) Weight change: 0 kg (0 lb) Last BM Date: 09/19/16  Intake/Output from previous day: 08/29 0701 - 08/30 0700 In: 2279 [P.O.:2219; I.V.:10; IV Piggyback:50] Out: 3200 [Urine:200]  PHYSICAL EXAM General appearance: alert, cooperative and mild distress Resp: His chest is much clearer and he is moving air better than I have heard him since she's been in the hospital Cardio: regular rate and rhythm, S1, S2 normal, no murmur, click, rub or gallop GI: soft, non-tender; bowel sounds normal; no masses,  no organomegaly Extremities: extremities normal, atraumatic, no cyanosis or edema Skin warm and dry  Lab Results:  Results for orders placed or performed during the hospital encounter of 09/12/16 (from the past 48 hour(s))  Renal function panel     Status: Abnormal   Collection Time: 09/19/16  5:04 AM  Result Value Ref Range   Sodium 134 (L) 135 - 145 mmol/L   Potassium 4.8 3.5 - 5.1 mmol/L   Chloride 94 (L) 101 - 111 mmol/L   CO2 21 (L) 22 - 32 mmol/L   Glucose, Bld 246 (H) 65 - 99 mg/dL   BUN 130 (H) 6 - 20 mg/dL    Comment: RESULTS CONFIRMED BY MANUAL DILUTION   Creatinine, Ser 14.14 (H) 0.61 - 1.24 mg/dL   Calcium 8.9 8.9 - 10.3 mg/dL   Phosphorus 6.7 (H) 2.5 - 4.6 mg/dL   Albumin 3.6 3.5 - 5.0 g/dL   GFR calc non Af Amer 3 (L) >60 mL/min   GFR calc Af Amer 4 (L) >60 mL/min   Comment: (NOTE) The eGFR has been calculated using the CKD EPI equation. This calculation has not been validated in all clinical situations. eGFR's persistently <60 mL/min signify possible Chronic Kidney Disease.    Anion gap 19 (H) 5 - 15  CBC     Status: Abnormal   Collection Time: 09/19/16  5:04 AM  Result Value Ref Range   WBC 16.1 (H) 4.0 - 10.5 K/uL   RBC 3.62 (L) 4.22 - 5.81 MIL/uL   Hemoglobin 11.6 (L) 13.0 - 17.0 g/dL   HCT 35.1 (L) 39.0 - 52.0 %   MCV 97.0 78.0 - 100.0 fL   MCH 32.0 26.0 - 34.0 pg   MCHC 33.0 30.0 - 36.0 g/dL   RDW 15.9 (H) 11.5 - 15.5 %   Platelets 168 150 - 400 K/uL    ABGS No results for input(s): PHART, PO2ART, TCO2, HCO3 in the last 72 hours.  Invalid input(s): PCO2 CULTURES Recent Results (from the past 240 hour(s))  MRSA PCR Screening     Status: None   Collection Time: 09/13/16  4:07 AM  Result Value Ref Range Status   MRSA by PCR NEGATIVE NEGATIVE Final    Comment:        The GeneXpert MRSA Assay (FDA approved for NASAL specimens only), is one component  of a comprehensive MRSA colonization surveillance program. It is not intended to diagnose MRSA infection nor to guide or monitor treatment for MRSA infections.    Studies/Results: Dg Chest Port 1 View  Result Date: 09/18/2016 CLINICAL DATA:  Shortness of breath and cough tonight. EXAM: PORTABLE CHEST 1 VIEW COMPARISON:  September 18, 2016 6:11 p.m. FINDINGS: The mediastinal contour is normal. The heart size is enlarged. There is mild interstitial edema. There is no focal pneumonia or pleural effusion. The osseous structures are stable. IMPRESSION: Mild congestive heart failure. Electronically Signed   By: Abelardo Diesel M.D.   On: 09/18/2016 20:10   Dg Chest Port 1 View  Result Date: 09/18/2016 CLINICAL DATA:  Difficulty breathing. Coughing. Shortness of breath . EXAM: PORTABLE CHEST 1 VIEW COMPARISON:  09/16/2016. FINDINGS: Cardiomegaly with mild pulmonary venous congestion. Mild  bilateral interstitial prominence. Findings suggest mild CHF. No pleural effusion or pneumothorax. No acute bony abnormality . IMPRESSION: Findings suggest mild congestive heart failure with mild interstitial edema . Electronically Signed   By: Marcello Moores  Register   On: 09/18/2016 11:55    Medications:  Prior to Admission:  Facility-Administered Medications Prior to Admission  Medication Dose Route Frequency Provider Last Rate Last Dose  . 0.9 %  sodium chloride infusion  500 mL Intravenous Continuous Milus Banister, MD       Prescriptions Prior to Admission  Medication Sig Dispense Refill Last Dose  . acetaminophen (TYLENOL) 500 MG tablet Take 1 tablet (500 mg total) by mouth every 6 (six) hours as needed for moderate pain.   09/12/2016 at Unknown time  . amLODipine (NORVASC) 10 MG tablet One twice daily to control BP 180 tablet 3 09/12/2016 at Unknown time  . aspirin EC 81 MG tablet Take 81 mg by mouth daily.   09/12/2016 at Unknown time  . b complex-vitamin c-folic acid (NEPHRO-VITE) 0.8 MG TABS tablet TAKE 1 TABLET BY MOUTH EVERY DAY 90 tablet 1 09/12/2016 at Unknown time  . budesonide-formoterol (SYMBICORT) 80-4.5 MCG/ACT inhaler Inhale 2 puffs into the lungs 2 (two) times daily. 1 Inhaler 1 unknown  . calcitRIOL (ROCALTROL) 0.25 MCG capsule Take 1 capsule (0.25 mcg total) by mouth every Monday, Wednesday, and Friday with hemodialysis. 30 capsule 1 09/12/2016 at Unknown time  . Cholecalciferol (VITAMIN D3) 5000 UNITS TABS Take 1 tablet by mouth daily.   09/12/2016 at Unknown time  . dextromethorphan-guaiFENesin (MUCINEX DM) 30-600 MG 12hr tablet Take 1 tablet by mouth 2 (two) times daily.   09/12/2016 at Unknown time  . diphenhydrAMINE (BENADRYL) 25 MG tablet Take 50 mg by mouth 2 (two) times daily as needed for allergies.    09/12/2016 at Unknown time  . guaiFENesin-dextromethorphan (ROBITUSSIN DM) 100-10 MG/5ML syrup Take 10 mLs by mouth every 4 (four) hours as needed for cough.   09/12/2016 at  Unknown time  . hydrALAZINE (APRESOLINE) 25 MG tablet One twice daily to regulate BP (Patient taking differently: Take 25 mg by mouth 3 (three) times daily. One twice daily to regulate BP) 180 tablet 3 09/12/2016 at Unknown time  . ipratropium-albuterol (DUONEB) 0.5-2.5 (3) MG/3ML SOLN Take 3 mLs by nebulization 3 (three) times daily. ICD 10 code J44.9 360 mL 0 09/12/2016 at Unknown time  . metoCLOPramide (REGLAN) 10 MG tablet One before each meal to increase stomach emptying (Patient taking differently: Take 10 mg by mouth 3 (three) times daily before meals. One before each meal to increase stomach emptying) 90 tablet 5 09/12/2016 at Unknown time  . metoprolol  tartrate (LOPRESSOR) 50 MG tablet One twice daily to regulate BP 180 tablet 3 09/12/2016 at 2215  . omeprazole (PRILOSEC) 20 MG capsule TAKE 1 CAPSULE (20 MG TOTAL) BY MOUTH 2 (TWO) TIMES DAILY BEFORE A MEAL. 60 capsule 0 09/12/2016 at Unknown time  . pravastatin (PRAVACHOL) 40 MG tablet Take 1 tablet (40 mg total) by mouth every evening. 90 tablet 1 09/12/2016 at Unknown time  . promethazine (PHENERGAN) 25 MG tablet TAKE 1 TABLET BY MOUTH EVERY 8 HOURS AS NEEDED FOR NAUSEA AND VOMITING.. CAN FILL 08/10 90 tablet 1 unknown at Unknown time  . traMADol (ULTRAM) 50 MG tablet One up to 4 times daly if needed for pain (Patient taking differently: Take 50 mg by mouth 4 (four) times daily as needed for moderate pain. One up to 4 times daly if needed for pain) 100 tablet 3 unknown   Scheduled: . amLODipine  10 mg Oral Daily  . aspirin EC  81 mg Oral Daily  . calcitRIOL  0.25 mcg Oral Q M,W,F-HD  . cholecalciferol  5,000 Units Oral Daily  . clonazePAM  0.5 mg Oral BID  . guaiFENesin  1,200 mg Oral BID  . heparin  5,000 Units Subcutaneous Q8H  . hydrALAZINE  50 mg Oral Q8H  . ipratropium-albuterol  3 mL Nebulization TID  . methylPREDNISolone (SOLU-MEDROL) injection  60 mg Intravenous Q6H  . metoprolol tartrate  50 mg Oral BID  . mometasone-formoterol  2  puff Inhalation BID  . nitroGLYCERIN  1 inch Topical Q8H  . pantoprazole  40 mg Oral Daily  . pravastatin  40 mg Oral QPM  . sevelamer carbonate  1,600 mg Oral TID WC  . sevelamer carbonate  800 mg Oral With snacks  . sodium chloride flush  3 mL Intravenous Q12H   Continuous: . sodium chloride    . sodium chloride    . sodium chloride    . sodium chloride    . sodium chloride    . sodium chloride    . sodium chloride    . cefTRIAXone (ROCEPHIN)  IV Stopped (09/19/16 0920)   ZOX:WRUEAV chloride, sodium chloride, sodium chloride, sodium chloride, sodium chloride, sodium chloride, sodium chloride, acetaminophen, alteplase, alum & mag hydroxide-simeth, diphenhydrAMINE, gi cocktail, guaiFENesin-dextromethorphan, heparin, hydrALAZINE, ipratropium-albuterol, lidocaine (PF), lidocaine-prilocaine, ondansetron **OR** ondansetron (ZOFRAN) IV, pentafluoroprop-tetrafluoroeth, simethicone, sodium chloride flush, traMADol  Assesment: He was admitted with shortness of breath that seems to have been a combination of volume overload and an acute exacerbation of chronic asthma. He had significant issues with anxiety and that's better. He was started on anxiolytic and on antibiotics yesterday and is having much better today and sounds better now. He has end-stage renal disease on dialysis which is ongoing. He has obstructive sleep apnea and uses CPAP at night and that's going okay. Echocardiogram was essentially normal Active Problems:   OSA on CPAP   ESRD on dialysis [facility])   Pulmonary edema    Plan: Continue current treatments.    LOS: 7 days   Brahim Dolman L 09/20/2016, 7:28 AM

## 2016-09-20 NOTE — Progress Notes (Signed)
Subjective: Interval History: Patient is feeling much better. He denies any difficulty breathing. No cough or chest pain.  Objective: Vital signs in last 24 hours: Temp:  [97.5 F (36.4 C)-98.8 F (37.1 C)] 98.8 F (37.1 C) (08/30 0400) Pulse Rate:  [61-93] 68 (08/30 0600) Resp:  [17-25] 21 (08/30 0600) BP: (138-180)/(57-124) 164/78 (08/30 0600) SpO2:  [87 %-98 %] 97 % (08/30 0600) Weight:  [102.9 kg (226 lb 13.7 oz)-103.1 kg (227 lb 4.7 oz)] 103.1 kg (227 lb 4.7 oz) (08/30 0500) Weight change: 0 kg (0 lb)  Intake/Output from previous day: 08/29 0701 - 08/30 0700 In: 2279 [P.O.:2219; I.V.:10; IV Piggyback:50] Out: 3200 [Urine:200] Intake/Output this shift: No intake/output data recorded.  Generally patient is alert and in no apparent distress Chest is clear to auscultation Heart exam regular rate and rhythm no murmur Extremities trace edema  Lab Results:  Recent Labs  09/17/16 0846 09/19/16 0504  WBC 13.4* 16.1*  HGB 11.0* 11.6*  HCT 32.9* 35.1*  PLT 150 168   BMET:   Recent Labs  09/17/16 0846 09/19/16 0504  NA 130* 134*  K 4.9 4.8  CL 90* 94*  CO2 16* 21*  GLUCOSE 239* 246*  BUN 130* 130*  CREATININE 15.77* 14.14*  CALCIUM 9.1 8.9   No results for input(s): PTH in the last 72 hours. Iron Studies: No results for input(s): IRON, TIBC, TRANSFERRIN, FERRITIN in the last 72 hours.  Studies/Results: Dg Chest Port 1 View  Result Date: 09/18/2016 CLINICAL DATA:  Shortness of breath and cough tonight. EXAM: PORTABLE CHEST 1 VIEW COMPARISON:  September 18, 2016 6:11 p.m. FINDINGS: The mediastinal contour is normal. The heart size is enlarged. There is mild interstitial edema. There is no focal pneumonia or pleural effusion. The osseous structures are stable. IMPRESSION: Mild congestive heart failure. Electronically Signed   By: Abelardo Diesel M.D.   On: 09/18/2016 20:10   Dg Chest Port 1 View  Result Date: 09/18/2016 CLINICAL DATA:  Difficulty breathing. Coughing.  Shortness of breath . EXAM: PORTABLE CHEST 1 VIEW COMPARISON:  09/16/2016. FINDINGS: Cardiomegaly with mild pulmonary venous congestion. Mild bilateral interstitial prominence. Findings suggest mild CHF. No pleural effusion or pneumothorax. No acute bony abnormality . IMPRESSION: Findings suggest mild congestive heart failure with mild interstitial edema . Electronically Signed   By: Marcello Moores  Register   On: 09/18/2016 11:55    I have reviewed the patient's current medications.  Assessment/Plan: Problem #1 difficulty breathing: Possibly a combination of fluid overload and exacerbation of his asthma. Patient is feeling much better. Problem #2 end-stage renal disease: He is status post hemodialysis yesterday. Presently he is asymptomatic. Problem #3 bone and mineral disorder: His calcium is range  and phosphorus remains  High. Patient is on a binder presently his phosphorus seems to be improving. Problem #4 hypertension: His blood pressure is reasonably controlled Problem #5. Anemia: His hemoglobin is within our target goal. Problem #6 history of obstructive sleep apnea Plan: 1] patient does require dialysis today 2] We will dialyze him tomorrow for 4 hours and try to remove another 3 L if his blood pressure tolerates. 3] we'll check his renal panel and CBC in the morning. 4] if patient is going to be discharged to go to outpatient dialysis unit.   LOS: 7 days   Ezelle Surprenant S 09/20/2016,8:12 AM

## 2016-09-20 NOTE — Care Management Note (Signed)
Case Management Note  Patient Details  Name: Kirk [name] MRN: [id number] Date of Birth: 01-Jan-1961  If discussed at [place] Length of Stay Meetings, dates discussed:  09/20/2016   Sherald Barge, RN 09/20/2016, 1:35 PM

## 2016-09-21 DIAGNOSIS — R0902 Hypoxemia: Secondary | ICD-10-CM

## 2016-09-21 DIAGNOSIS — N186 End stage renal disease: Secondary | ICD-10-CM | POA: Diagnosis not present

## 2016-09-21 DIAGNOSIS — T8612 Kidney transplant failure: Secondary | ICD-10-CM | POA: Diagnosis not present

## 2016-09-21 DIAGNOSIS — Z992 Dependence on renal dialysis: Secondary | ICD-10-CM | POA: Diagnosis not present

## 2016-09-21 LAB — CBC
HCT: 34.8 % — ABNORMAL LOW (ref 39.0–52.0)
Hemoglobin: 11.6 g/dL — ABNORMAL LOW (ref 13.0–17.0)
MCH: 32.2 pg (ref 26.0–34.0)
MCHC: 33.3 g/dL (ref 30.0–36.0)
MCV: 96.7 fL (ref 78.0–100.0)
PLATELETS: 140 10*3/uL — AB (ref 150–400)
RBC: 3.6 MIL/uL — AB (ref 4.22–5.81)
RDW: 16.8 % — ABNORMAL HIGH (ref 11.5–15.5)
WBC: 12.2 10*3/uL — AB (ref 4.0–10.5)

## 2016-09-21 LAB — RENAL FUNCTION PANEL
Albumin: 3.2 g/dL — ABNORMAL LOW (ref 3.5–5.0)
Anion gap: 18 — ABNORMAL HIGH (ref 5–15)
BUN: 136 mg/dL — ABNORMAL HIGH (ref 6–20)
CALCIUM: 8.2 mg/dL — AB (ref 8.9–10.3)
CHLORIDE: 93 mmol/L — AB (ref 101–111)
CO2: 20 mmol/L — ABNORMAL LOW (ref 22–32)
CREATININE: 13.66 mg/dL — AB (ref 0.61–1.24)
GFR, EST AFRICAN AMERICAN: 4 mL/min — AB (ref >60)
GFR, EST NON AFRICAN AMERICAN: 4 mL/min — AB (ref >60)
Glucose, Bld: 190 mg/dL — ABNORMAL HIGH (ref 65–99)
PHOSPHORUS: 8.2 mg/dL — AB (ref 2.5–4.6)
Potassium: 4.4 mmol/L (ref 3.5–5.1)
Sodium: 131 mmol/L — ABNORMAL LOW (ref 135–145)

## 2016-09-21 LAB — GLUCOSE, CAPILLARY: Glucose-Capillary: 175 mg/dL — ABNORMAL HIGH (ref 65–99)

## 2016-09-21 MED ORDER — SODIUM CHLORIDE 0.9 % IV SOLN: 100.0000 mL | INTRAVENOUS | Status: DC | PRN

## 2016-09-21 MED ORDER — SEVELAMER CARBONATE 800 MG PO TABS
1600.0000 mg | ORAL_TABLET | ORAL | Status: DC
  Administered 2016-09-21: 1600 mg via ORAL
  Filled 2016-09-21: qty 2

## 2016-09-21 MED ORDER — CEFUROXIME AXETIL 250 MG PO TABS
500.0000 mg | ORAL_TABLET | Freq: Every day | ORAL | Status: DC
  Administered 2016-09-21 – 2016-09-22 (×2): 500 mg via ORAL
  Filled 2016-09-21 (×2): qty 2

## 2016-09-21 MED ORDER — SEVELAMER CARBONATE 800 MG PO TABS
2400.0000 mg | ORAL_TABLET | Freq: Three times a day (TID) | ORAL | Status: DC
  Administered 2016-09-21 – 2016-09-22 (×4): 2400 mg via ORAL
  Filled 2016-09-21 (×3): qty 3

## 2016-09-21 MED ORDER — PENTAFLUOROPROP-TETRAFLUOROETH EX AERO
INHALATION_SPRAY | CUTANEOUS | Status: AC
  Administered 2016-09-21: 1 via TOPICAL
  Filled 2016-09-21: qty 103.5

## 2016-09-21 NOTE — Procedures (Signed)
     HEMODIALYSIS TREATMENT NOTE:  4 hour heparin-free dialysis completed via left forearm AVF (15g/antegrade). Goal met: 3 liters removed without interruption in ultrafiltration.  All blood was returned and hemostasis was achieved within 15 minutes. Report given to Terence Lux, RN.  Rockwell Alexandria, RN CDN

## 2016-09-21 NOTE — Progress Notes (Signed)
PHARMACY NOTE:  ANTIMICROBIAL RENAL DOSAGE ADJUSTMENT  Current antimicrobial regimen includes a mismatch between antimicrobial dosage and estimated renal function.  As per policy approved by the Pharmacy & Therapeutics and Medical Executive Committees, the antimicrobial dosage will be adjusted accordingly.  Current antimicrobial dosage:  Cefuroxime 500 mg po bid    Renal Function:  Estimated Creatinine Clearance: 7.1 mL/min (A) (by C-G formula based on SCr of 13.66 mg/dL (H)). []      On intermittent HD, scheduled: []      On CRRT    Antimicrobial dosage has been changed to:  Cefuroxime 500 mg po daily  Additional comments:   Thank you for allowing pharmacy to be a part of this patient's care.  Gildardo Griffes [place], [facility] 09/21/2016 8:07 AM

## 2016-09-21 NOTE — Progress Notes (Signed)
Subjective: He had a good night last night. His IV did come out. He did not have any significant respiratory issues during the night. He is still short of breath with exertion but it is improving.  Objective: Vital signs in last 24 hours: Temp:  [97.8 F (36.6 C)-98.3 F (36.8 C)] 98.2 F (36.8 C) (08/31 0400) Pulse Rate:  [59-79] 63 (08/31 0600) Resp:  [17-25] 18 (08/31 0600) BP: (112-189)/(47-101) 152/67 (08/31 0617) SpO2:  [92 %-100 %] 97 % (08/31 0600) Weight change:  Last BM Date: 09/19/16  Intake/Output from previous day: 08/30 0701 - 08/31 0700 In: 770 [P.O.:720; IV Piggyback:50] Out: -   PHYSICAL EXAM General appearance: alert, cooperative and no distress Resp: clear to auscultation bilaterally Cardio: regular rate and rhythm, S1, S2 normal, no murmur, click, rub or gallop GI: soft, non-tender; bowel sounds normal; no masses,  no organomegaly Extremities: extremities normal, atraumatic, no cyanosis or edema Throat is clear  Lab Results:  Results for orders placed or performed during the hospital encounter of 09/12/16 (from the past 48 hour(s))  CBC     Status: Abnormal   Collection Time: 09/21/16  3:59 AM  Result Value Ref Range   WBC 12.2 (H) 4.0 - 10.5 K/uL   RBC 3.60 (L) 4.22 - 5.81 MIL/uL   Hemoglobin 11.6 (L) 13.0 - 17.0 g/dL   HCT 34.8 (L) 39.0 - 52.0 %   MCV 96.7 78.0 - 100.0 fL   MCH 32.2 26.0 - 34.0 pg   MCHC 33.3 30.0 - 36.0 g/dL   RDW 16.8 (H) 11.5 - 15.5 %   Platelets 140 (L) 150 - 400 K/uL  Renal function panel     Status: Abnormal   Collection Time: 09/21/16  3:59 AM  Result Value Ref Range   Sodium 131 (L) 135 - 145 mmol/L   Potassium 4.4 3.5 - 5.1 mmol/L   Chloride 93 (L) 101 - 111 mmol/L   CO2 20 (L) 22 - 32 mmol/L   Glucose, Bld 190 (H) 65 - 99 mg/dL   BUN 136 (H) 6 - 20 mg/dL    Comment: RESULTS CONFIRMED BY MANUAL DILUTION   Creatinine, Ser 13.66 (H) 0.61 - 1.24 mg/dL   Calcium 8.2 (L) 8.9 - 10.3 mg/dL   Phosphorus 8.2 (H) 2.5 - 4.6  mg/dL   Albumin 3.2 (L) 3.5 - 5.0 g/dL   GFR calc non Af Amer 4 (L) >60 mL/min   GFR calc Af Amer 4 (L) >60 mL/min    Comment: (NOTE) The eGFR has been calculated using the CKD EPI equation. This calculation has not been validated in all clinical situations. eGFR's persistently <60 mL/min signify possible Chronic Kidney Disease.    Anion gap 18 (H) 5 - 15    ABGS No results for input(s): PHART, PO2ART, TCO2, HCO3 in the last 72 hours.  Invalid input(s): PCO2 CULTURES Recent Results (from the past 240 hour(s))  MRSA PCR Screening     Status: None   Collection Time: 09/13/16  4:07 AM  Result Value Ref Range Status   MRSA by PCR NEGATIVE NEGATIVE Final    Comment:        The GeneXpert MRSA Assay (FDA approved for NASAL specimens only), is one component of a comprehensive MRSA colonization surveillance program. It is not intended to diagnose MRSA infection nor to guide or monitor treatment for MRSA infections.    Studies/Results: No results found.  Medications:  Prior to Admission:  Facility-Administered Medications Prior to Admission  Medication Dose Route Frequency Provider Last Rate Last Dose  . 0.9 %  sodium chloride infusion  500 mL Intravenous Continuous Milus Banister, MD       Prescriptions Prior to Admission  Medication Sig Dispense Refill Last Dose  . acetaminophen (TYLENOL) 500 MG tablet Take 1 tablet (500 mg total) by mouth every 6 (six) hours as needed for moderate pain.   09/12/2016 at Unknown time  . amLODipine (NORVASC) 10 MG tablet One twice daily to control BP 180 tablet 3 09/12/2016 at Unknown time  . aspirin EC 81 MG tablet Take 81 mg by mouth daily.   09/12/2016 at Unknown time  . b complex-vitamin c-folic acid (NEPHRO-VITE) 0.8 MG TABS tablet TAKE 1 TABLET BY MOUTH EVERY DAY 90 tablet 1 09/12/2016 at Unknown time  . budesonide-formoterol (SYMBICORT) 80-4.5 MCG/ACT inhaler Inhale 2 puffs into the lungs 2 (two) times daily. 1 Inhaler 1 unknown  .  calcitRIOL (ROCALTROL) 0.25 MCG capsule Take 1 capsule (0.25 mcg total) by mouth every Monday, Wednesday, and Friday with hemodialysis. 30 capsule 1 09/12/2016 at Unknown time  . Cholecalciferol (VITAMIN D3) 5000 UNITS TABS Take 1 tablet by mouth daily.   09/12/2016 at Unknown time  . dextromethorphan-guaiFENesin (MUCINEX DM) 30-600 MG 12hr tablet Take 1 tablet by mouth 2 (two) times daily.   09/12/2016 at Unknown time  . diphenhydrAMINE (BENADRYL) 25 MG tablet Take 50 mg by mouth 2 (two) times daily as needed for allergies.    09/12/2016 at Unknown time  . guaiFENesin-dextromethorphan (ROBITUSSIN DM) 100-10 MG/5ML syrup Take 10 mLs by mouth every 4 (four) hours as needed for cough.   09/12/2016 at Unknown time  . hydrALAZINE (APRESOLINE) 25 MG tablet One twice daily to regulate BP (Patient taking differently: Take 25 mg by mouth 3 (three) times daily. One twice daily to regulate BP) 180 tablet 3 09/12/2016 at Unknown time  . ipratropium-albuterol (DUONEB) 0.5-2.5 (3) MG/3ML SOLN Take 3 mLs by nebulization 3 (three) times daily. ICD 10 code J44.9 360 mL 0 09/12/2016 at Unknown time  . metoCLOPramide (REGLAN) 10 MG tablet One before each meal to increase stomach emptying (Patient taking differently: Take 10 mg by mouth 3 (three) times daily before meals. One before each meal to increase stomach emptying) 90 tablet 5 09/12/2016 at Unknown time  . metoprolol tartrate (LOPRESSOR) 50 MG tablet One twice daily to regulate BP 180 tablet 3 09/12/2016 at 2215  . omeprazole (PRILOSEC) 20 MG capsule TAKE 1 CAPSULE (20 MG TOTAL) BY MOUTH 2 (TWO) TIMES DAILY BEFORE A MEAL. 60 capsule 0 09/12/2016 at Unknown time  . pravastatin (PRAVACHOL) 40 MG tablet Take 1 tablet (40 mg total) by mouth every evening. 90 tablet 1 09/12/2016 at Unknown time  . promethazine (PHENERGAN) 25 MG tablet TAKE 1 TABLET BY MOUTH EVERY 8 HOURS AS NEEDED FOR NAUSEA AND VOMITING.. CAN FILL 08/10 90 tablet 1 unknown at Unknown time  . traMADol (ULTRAM) 50  MG tablet One up to 4 times daly if needed for pain (Patient taking differently: Take 50 mg by mouth 4 (four) times daily as needed for moderate pain. One up to 4 times daly if needed for pain) 100 tablet 3 unknown   Scheduled: . amLODipine  10 mg Oral Daily  . aspirin EC  81 mg Oral Daily  . calcitRIOL  0.25 mcg Oral Q M,W,F-HD  . cholecalciferol  5,000 Units Oral Daily  . clonazePAM  0.5 mg Oral BID  . guaiFENesin  1,200 mg Oral  BID  . heparin  5,000 Units Subcutaneous Q8H  . hydrALAZINE  50 mg Oral Q8H  . ipratropium-albuterol  3 mL Nebulization TID  . metoprolol tartrate  75 mg Oral BID  . mometasone-formoterol  2 puff Inhalation BID  . nitroGLYCERIN  1 inch Topical Q8H  . pantoprazole  40 mg Oral Daily  . pravastatin  40 mg Oral QPM  . sevelamer carbonate  1,600 mg Oral TID WC  . sevelamer carbonate  800 mg Oral With snacks  . sodium chloride flush  3 mL Intravenous Q12H   Continuous: . sodium chloride    . sodium chloride    . sodium chloride    . sodium chloride    . sodium chloride    . sodium chloride    . sodium chloride    . cefTRIAXone (ROCEPHIN)  IV Stopped (09/20/16 0919)   KXF:GHWEXH chloride, sodium chloride, sodium chloride, sodium chloride, sodium chloride, sodium chloride, sodium chloride, acetaminophen, alteplase, alum & mag hydroxide-simeth, diphenhydrAMINE, gi cocktail, guaiFENesin-dextromethorphan, heparin, hydrALAZINE, ipratropium-albuterol, lidocaine (PF), lidocaine-prilocaine, ondansetron **OR** ondansetron (ZOFRAN) IV, pentafluoroprop-tetrafluoroeth, simethicone, sodium chloride flush, traMADol  Assesment: He was admitted with acute hypoxic respiratory failure and initially required BiPAP. He has improved. It appeared that he had some pulmonary edema. He also has asthma at baseline and I think he also had an acute asthmatic attack. He's been treated for bronchitis with antibiotics and that seems to have helped. He has obstructive sleep apnea at baseline. He  has end-stage renal disease dialysis continues. He has significant issues with anxiety but that's better since he is on anxiolytics now. He is overall significantly improved Active Problems:   OSA on CPAP   ESRD on dialysis [facility])   Pulmonary edema    Plan: Continue current treatments    LOS: 8 days   Domenique Southers L 09/21/2016, 7:50 AM

## 2016-09-21 NOTE — Progress Notes (Signed)
PROGRESS NOTE    LANKFORD GUTZMER  RWE:[id number] DOB: 1960-12-23 DOA: 09/12/2016 PCP: Gayland Curry, DO    Brief Narrative:   56 yo admitted 8/23 for SOB. He has ESRD on HD, asthma getting nebs and IV steroids, having vascular congestion and found to have anxiety. He was given PRN IV Hydralazine for BP, Klonopin for anxiety, and NTP to decrease his pre-load.  He felt much better today.  He improved to no longer require Bipap except at night when he sleeps.  He refuses IV to be restarted.    Assessment & Plan:   Active Problems:   OSA on CPAP   ESRD on dialysis [facility])   Pulmonary edema  1. ESRD on HD: He is better today with volume status.  Wheezing has improved markedly. Continue with HD per nephrology.  ECHO was OK, with EF 55 to 60%. 2. Asthma: He is not wheezing significantly, and I suspect pulmonary edema. Steroid d/c and he can be transferred to tele today.  Plan discharge home tomorrow.  3. Anxiety: Spoke with Dr Luan Pulling. Klonopin has helped.  Will continue.  4. HTN: Continue with meds. Give IV Hydralazine PRN. To avoid hypertensive pulmonary edema.  Have Increased Lopressor to 75mg  BID> Careful not to over Tx.  Should also be better with fluid removal.   DVT prophylaxis: Lovenox.  Code Status: FULL CODE.  Family Communication: Wife Mechele Claude and daughter at bedside.  Disposition Plan: Home.  Consultants:   Pulmonary.   Nephrology.   Procedures:   None.   Antimicrobials: Anti-infectives    Start     Dose/Rate Route Frequency Ordered Stop   09/21/16 0800  cefUROXime (CEFTIN) tablet 500 mg     500 mg Oral 2 times daily with meals 09/21/16 0757 09/26/16 0759   09/19/16 0900  cefTRIAXone (ROCEPHIN) 1 g in dextrose 5 % 50 mL IVPB  Status:  Discontinued     1 g 100 mL/hr over 30 Minutes Intravenous Every 24 hours 09/19/16 0740 09/21/16 0757       Subjective:   Feeling much better.   Objective: Vitals:   09/21/16 0400 09/21/16 0500 09/21/16 0600 09/21/16  0617  BP: (!) 186/76 (!) 179/79 (!) 164/74 (!) 152/67  Pulse: 68 63 63   Resp: (!) 25 19 18    Temp: 98.2 F (36.8 C)     TempSrc: Oral     SpO2: 97% 98% 97%   Weight:      Height:        Intake/Output Summary (Last 24 hours) at 09/21/16 0759 Last data filed at 09/20/16 1800  Gross per 24 hour  Intake              770 ml  Output                0 ml  Net              770 ml   Filed Weights   09/19/16 0400 09/19/16 0930 09/20/16 0500  Weight: 102.9 kg (226 lb 13.7 oz) 102.9 kg (226 lb 13.7 oz) 103.1 kg (227 lb 4.7 oz)    Examination:  General exam: Appears calm and comfortable  Respiratory system: Clear to auscultation. Respiratory effort normal. Cardiovascular system: S1 & S2 heard, RRR. No JVD, murmurs, rubs, gallops or clicks. No pedal edema. Gastrointestinal system: Abdomen is nondistended, soft and nontender. No organomegaly or masses felt. Normal bowel sounds heard. Central nervous system: Alert and oriented. No focal neurological deficits. Extremities:  Symmetric 5 x 5 power. Skin: No rashes, lesions or ulcers Psychiatry: Judgement and insight appear normal. Mood & affect appropriate.   Data Reviewed: I have personally reviewed following labs and imaging studies  CBC:  Recent Labs Lab 09/16/16 0336 09/17/16 0846 09/19/16 0504 09/21/16 0359  WBC 9.6 13.4* 16.1* 12.2*  NEUTROABS  --  12.4*  --   --   HGB 11.2* 11.0* 11.6* 11.6*  HCT 34.4* 32.9* 35.1* 34.8*  MCV 96.9 96.2 97.0 96.7  PLT 155 150 168 572*   Basic Metabolic Panel:  Recent Labs Lab 09/16/16 0336 09/17/16 0846 09/19/16 0504 09/21/16 0359  NA 135 130* 134* 131*  K 4.8 4.9 4.8 4.4  CL 93* 90* 94* 93*  CO2 23 16* 21* 20*  GLUCOSE 180* 239* 246* 190*  BUN 83* 130* 130* 136*  CREATININE 12.53* 15.77* 14.14* 13.66*  CALCIUM 9.4 9.1 8.9 8.2*  PHOS  --  7.0* 6.7* 8.2*   GFR: Estimated Creatinine Clearance: 7.1 mL/min (A) (by C-G formula based on SCr of 13.66 mg/dL (H)). Liver Function  Tests:  Recent Labs Lab 09/17/16 0846 09/19/16 0504 09/21/16 0359  ALBUMIN 3.8 3.6 3.2*     Recent Results (from the past 240 hour(s))  MRSA PCR Screening     Status: None   Collection Time: 09/13/16  4:07 AM  Result Value Ref Range Status   MRSA by PCR NEGATIVE NEGATIVE Final    Comment:        The GeneXpert MRSA Assay (FDA approved for NASAL specimens only), is one component of a comprehensive MRSA colonization surveillance program. It is not intended to diagnose MRSA infection nor to guide or monitor treatment for MRSA infections.      Radiology Studies: No results found.  Scheduled Meds: . amLODipine  10 mg Oral Daily  . aspirin EC  81 mg Oral Daily  . calcitRIOL  0.25 mcg Oral Q M,W,F-HD  . cefUROXime  500 mg Oral BID WC  . cholecalciferol  5,000 Units Oral Daily  . clonazePAM  0.5 mg Oral BID  . guaiFENesin  1,200 mg Oral BID  . heparin  5,000 Units Subcutaneous Q8H  . hydrALAZINE  50 mg Oral Q8H  . ipratropium-albuterol  3 mL Nebulization TID  . metoprolol tartrate  75 mg Oral BID  . mometasone-formoterol  2 puff Inhalation BID  . pantoprazole  40 mg Oral Daily  . pravastatin  40 mg Oral QPM  . sevelamer carbonate  1,600 mg Oral TID WC  . sevelamer carbonate  800 mg Oral With snacks  . sodium chloride flush  3 mL Intravenous Q12H   Continuous Infusions: . sodium chloride    . sodium chloride    . sodium chloride    . sodium chloride    . sodium chloride    . sodium chloride    . sodium chloride       LOS: 8 days   Temara Lanum, MD [facility].   If 7PM-7AM, please contact night-coverage [email or web address] Password [email or web address] 09/21/2016, 7:59 AM

## 2016-09-21 NOTE — Progress Notes (Signed)
SATURATION QUALIFICATIONS: (This note is used to comply with regulatory documentation for home oxygen)  Patient Saturations on Room Air at Rest = 98%  Patient Saturations on Room Air while Ambulating = 97%  Patient Saturations on 4 Liters of oxygen while Ambulating = Not necessary  Please briefly explain why patient needs home oxygen:

## 2016-09-21 NOTE — Progress Notes (Signed)
Pt. did not want another IV started. Pt was advised of need for access during stay.

## 2016-09-21 NOTE — Progress Notes (Signed)
Patient continues to use our CPAP unit at bed time with a full face mask; he will will continue to be monitored throughout the evening.

## 2016-09-21 NOTE — Care Management Important Message (Signed)
Important Message  Patient Details  Name: ROYAL VANDEVOORT MRN: [id number] Date of Birth: 07-18-1960   Medicare Important Message Given:  Yes    Sherald Barge, RN 09/21/2016, 1:47 PM

## 2016-09-21 NOTE — Progress Notes (Signed)
Subjective: Interval History: Patient is seen on hemodialysis. His feeling much better. Presently he doesn't have a cough and no difficulty breathing.  Objective: Vital signs in last 24 hours: Temp:  [97.9 F (36.6 C)-98.7 F (37.1 C)] 98.7 F (37.1 C) (08/31 0821) Pulse Rate:  [59-79] 63 (08/31 0600) Resp:  [17-25] 18 (08/31 0600) BP: (112-189)/(47-101) 152/67 (08/31 0617) SpO2:  [93 %-100 %] 97 % (08/31 0600) Weight change:   Intake/Output from previous day: 08/30 0701 - 08/31 0700 In: 770 [P.O.:720; IV Piggyback:50] Out: -  Intake/Output this shift: No intake/output data recorded.  Generally patient is alert and in no apparent distress Chest is clear to auscultation Heart exam regular rate and rhythm no murmur Extremities no  edema  Lab Results:  Recent Labs  09/19/16 0504 09/21/16 0359  WBC 16.1* 12.2*  HGB 11.6* 11.6*  HCT 35.1* 34.8*  PLT 168 140*   BMET:   Recent Labs  09/19/16 0504 09/21/16 0359  NA 134* 131*  K 4.8 4.4  CL 94* 93*  CO2 21* 20*  GLUCOSE 246* 190*  BUN 130* 136*  CREATININE 14.14* 13.66*  CALCIUM 8.9 8.2*   No results for input(s): PTH in the last 72 hours. Iron Studies: No results for input(s): IRON, TIBC, TRANSFERRIN, FERRITIN in the last 72 hours.  Studies/Results: No results found.  I have reviewed the patient's current medications.  Assessment/Plan: Problem #1 difficulty breathing: Possibly a combination of fluid overload and exacerbation of his asthma. Has improved. Problem #2 end-stage renal disease: He is status post hemodialysis on Wednesday and presently on dialysis. Patient has movement doesn't have any uremic signs and  symptoms. His potassium is normal. Problem #3 bone and mineral disorder: His calcium is range  and phosphorus remains  High but worsening. Presently he is on binder. Problem #4 hypertension: His blood pressure is high this morning. Problem #5. Anemia: His hemoglobin remains within our target  goal. Problem #6 history of obstructive sleep apnea Plan: 1] patient does require dialysis today 2] We will dialyze him for 4 hours and try to remove another 3 L if his blood pressure tolerates. 3] we'll check his renal panel and CBC in the morning. 4] if patient is going to be discharged to go to outpatient dialysis unit on Monday which is his regular schedule.   LOS: 8 days   Indianna Boran S 09/21/2016,9:01 AM

## 2016-09-21 NOTE — Care Management (Signed)
DC planned for tomorrow. Pt will need home oxygen assessment as he is still on 4lpm. O2 assessment pending.

## 2016-09-21 NOTE — Progress Notes (Signed)
PT TRANSFERRING TO TELEMETRY TO ROOM 312.REPORT CALLED TO MORGAN RN ON 300. PT TO FINISH HIS DIALYSIS TX BEFORE TRANSFERRING TO ROOM. NO IV. PT REFUSES TO HAVE NEW ONE INSERTED. DR LE AWARE. LT ARM FISTULA. HR 70'S IN NSR. PT 0N O2 AT 4L/MIN VIA [name]. DIMINISHED BREATH SOUNDS.NO PHEREPERAL  EDEMA..VSS.

## 2016-09-22 MED ORDER — HYDRALAZINE HCL 50 MG PO TABS: 50.0000 mg | ORAL_TABLET | Freq: Three times a day (TID) | ORAL | 1 refills | Status: DC

## 2016-09-22 MED ORDER — CLONAZEPAM 0.5 MG PO TABS: 0.5000 mg | ORAL_TABLET | Freq: Two times a day (BID) | ORAL | 0 refills | Status: DC

## 2016-09-22 MED ORDER — SEVELAMER CARBONATE 800 MG PO TABS: 1600.0000 mg | ORAL_TABLET | ORAL | 1 refills | Status: DC

## 2016-09-22 MED ORDER — METOPROLOL TARTRATE 75 MG PO TABS: 75.0000 mg | ORAL_TABLET | Freq: Two times a day (BID) | ORAL | 0 refills | Status: DC

## 2016-09-22 MED ORDER — CEFUROXIME AXETIL 500 MG PO TABS: 500.0000 mg | ORAL_TABLET | Freq: Every day | ORAL | 0 refills | Status: DC

## 2016-09-22 MED ORDER — SEVELAMER CARBONATE 800 MG PO TABS: 2400.0000 mg | ORAL_TABLET | Freq: Three times a day (TID) | ORAL | 0 refills | Status: DC

## 2016-09-22 NOTE — Progress Notes (Signed)
Patient is to be discharged home and in stable condition. No IV present. Telemetry removed. Patient and family given discharge instructions and verbalized understanding. Patient denies the need for a wheelchair ride to transportation.   Celestia Khat, RN

## 2016-09-22 NOTE — Discharge Summary (Signed)
Physician Discharge Summary  Kirk [name] BMW:[id number] DOB: [birth year] DOA: 09/12/2016  PCP: Gayland Curry, DO  Admit date: 09/12/2016 Discharge date: 09/22/2016  Admitted From: Home.  Disposition:  Home.   Recommendations for Outpatient Follow-up:  1. Follow up with PCP in 1-2 weeks 2. Follow up with Nephrology next week for continued dialysis.   Home Health: None.  Equipment/Devices: has CPAP at home.  Discharge Condition: Clear lungs.  No SOB and no wheezing.  CODE STATUS: FULL CODE.  Diet recommendation:  Renal diet.   Brief/Interim Summary: patgient was admitted for SOB, felt to be mostly pulmonary edema, by Dr Darrick Meigs on September 13, 2016.  As per his H and P:  "  Kirk [name]  is a 56 y.o. male, With history of hypertension, ESRD on hemodialysis Monday Wednesday Friday, obstructive sleep apnea on CPAP, hypertension who came to hospital after he developed shortness of breath starting yesterday after hemodialysis. As per patient's wife patient was fine uneventful hematemesis on Wednesday morning and at 10:30 AM he started having cough which was associated with shortness of breath and some chest pain. When shortness of breath did not improve the came to the ED for further evaluation. In the ED chest x-ray shows pulmonary vascular congestion, and patient started on BiPAP. He was given 1 dose of Lasix 100 mg IV 1, Nitropaste 15 mg x1. BNP elevated to 573. He denies nausea vomiting or diarrhea. He still makes some urine. He denies fever. But had 3 episodes of profuse sweating at home.  HOSPITAL COURSE:    56 yo admitted 8/23 for SOB. His SOB was felt to be mostly volume overload, but it was felt that he may have a small component of bronchospasm as well.  He was admitted into the ICU, and was given Bipap.  He was seen in consultation with pulmonary, and nephrology.  Dr Luan Pulling of pulmonary started him empirically on IV Rocephin, in addition to his Bipap, IV steroid, and neb Tx.  Dr  Chilton Greathouse of nephrology saw him in consultation as well, and he did provide extra ultrafiltration.  He was slow to respond, but improved especially after his BP gotten under better controlled as well.  He was given IV Hydralazine, along with transient use of NTP.  His Lopressor was increased to 75mg  BID, his Norvasc was continued, and his Hydralazine was increased to 50mg  TID.  His BS's were elevated with IV steroid, and it was covered with insulin.  As he improved, steroid and NTP were discontinued.  His IV Rocpehin was converted to Ceftin, and he was given Kllonopin for anxiety.  He now felt well, and is ready for discharge.  He will follow up with his PCP next week, and with his neprhologist as his dialysis schedule dictates.   He improved to no longer require Bipap except at night when he sleeps.   Thank you for allowing me to participate in his care.  Good Day.   Discharge Diagnoses:  Active Problems:   OSA on CPAP   ESRD on dialysis [facility])   Pulmonary edema    Discharge Instructions  Discharge Instructions    Diet - low sodium heart healthy    Complete by:  As directed    Discharge instructions    Complete by:  As directed    Take your medication as prescribed.  Follow up with your PCP next week.  Follow your dialysis schedule as instructed.   Increase activity slowly    Complete by:  As directed      Allergies as of 09/22/2016      Reactions   Codeine Nausea And Vomiting, Nausea Only   Altered mental status      Medication List    STOP taking these medications   ipratropium-albuterol 0.5-2.5 (3) MG/3ML Soln Commonly known as:  DUONEB   promethazine 25 MG tablet Commonly known as:  PHENERGAN     TAKE these medications   acetaminophen 500 MG tablet Commonly known as:  TYLENOL Take 1 tablet (500 mg total) by mouth every 6 (six) hours as needed for moderate pain.   amLODipine 10 MG tablet Commonly known as:  NORVASC One twice daily to control BP   aspirin EC 81 MG  tablet Take 81 mg by mouth daily.   b complex-vitamin c-folic acid 0.8 MG Tabs tablet TAKE 1 TABLET BY MOUTH EVERY DAY   BENADRYL 25 MG tablet Generic drug:  diphenhydrAMINE Take 50 mg by mouth 2 (two) times daily as needed for allergies.   budesonide-formoterol 80-4.5 MCG/ACT inhaler Commonly known as:  SYMBICORT Inhale 2 puffs into the lungs 2 (two) times daily.   calcitRIOL 0.25 MCG capsule Commonly known as:  ROCALTROL Take 1 capsule (0.25 mcg total) by mouth every Monday, Wednesday, and Friday with hemodialysis.   cefUROXime 500 MG tablet Commonly known as:  CEFTIN Take 1 tablet (500 mg total) by mouth daily.   clonazePAM 0.5 MG tablet Commonly known as:  KLONOPIN Take 1 tablet (0.5 mg total) by mouth 2 (two) times daily.   guaiFENesin-dextromethorphan 100-10 MG/5ML syrup Commonly known as:  ROBITUSSIN DM Take 10 mLs by mouth every 4 (four) hours as needed for cough. What changed:  Another medication with the same name was removed. Continue taking this medication, and follow the directions you see here.   hydrALAZINE 50 MG tablet Commonly known as:  APRESOLINE Take 1 tablet (50 mg total) by mouth every 8 (eight) hours. What changed:  medication strength  how much to take  how to take this  when to take this  additional instructions   metoCLOPramide 10 MG tablet Commonly known as:  REGLAN One before each meal to increase stomach emptying What changed:  how much to take  how to take this  when to take this  additional instructions   Metoprolol Tartrate 75 MG Tabs Take 75 mg by mouth 2 (two) times daily. What changed:  medication strength  how much to take  how to take this  when to take this  additional instructions   omeprazole 20 MG capsule Commonly known as:  PRILOSEC TAKE 1 CAPSULE (20 MG TOTAL) BY MOUTH 2 (TWO) TIMES DAILY BEFORE A MEAL.   pravastatin 40 MG tablet Commonly known as:  PRAVACHOL Take 1 tablet (40 mg total) by mouth  every evening.   sevelamer carbonate 800 MG tablet Commonly known as:  RENVELA Take 2 tablets (1,600 mg total) by mouth with snacks.   sevelamer carbonate 800 MG tablet Commonly known as:  RENVELA Take 3 tablets (2,400 mg total) by mouth 3 (three) times daily with meals.   traMADol 50 MG tablet Commonly known as:  ULTRAM One up to 4 times daly if needed for pain What changed:  how much to take  how to take this  when to take this  reasons to take this  additional instructions   Vitamin D3 5000 units Tabs Take 1 tablet by mouth daily.  Discharge Care Instructions        Start     Ordered   09/22/16 0000  cefUROXime (CEFTIN) 500 MG tablet  Daily     09/22/16 1106   09/22/16 0000  clonazePAM (KLONOPIN) 0.5 MG tablet  2 times daily     09/22/16 1106   09/22/16 0000  hydrALAZINE (APRESOLINE) 50 MG tablet  Every 8 hours     09/22/16 1106   09/22/16 0000  metoprolol tartrate 75 MG TABS  2 times daily     09/22/16 1106   09/22/16 0000  sevelamer carbonate (RENVELA) 800 MG tablet  With Snacks     09/22/16 1106   09/22/16 0000  sevelamer carbonate (RENVELA) 800 MG tablet  3 times daily with meals     09/22/16 1106   09/22/16 0000  Increase activity slowly     09/22/16 1106   09/22/16 0000  Diet - low sodium heart healthy     09/22/16 1106   09/22/16 0000  Discharge instructions    Comments:  Take your medication as prescribed.  Follow up with your PCP next week.  Follow your dialysis schedule as instructed.   09/22/16 1106      Allergies  Allergen Reactions  . Codeine Nausea And Vomiting and Nausea Only    Altered mental status    Consultations: Nephrology and Pulmonary.l   Procedures/Studies: Dg Chest Port 1 View  Result Date: 09/18/2016 CLINICAL DATA:  Shortness of breath and cough tonight. EXAM: PORTABLE CHEST 1 VIEW COMPARISON:  September 18, 2016 6:11 p.m. FINDINGS: The mediastinal contour is normal. The heart size is enlarged. There is mild  interstitial edema. There is no focal pneumonia or pleural effusion. The osseous structures are stable. IMPRESSION: Mild congestive heart failure. Electronically Signed   By: Abelardo Diesel M.D.   On: 09/18/2016 20:10   Dg Chest Port 1 View  Result Date: 09/18/2016 CLINICAL DATA:  Difficulty breathing. Coughing. Shortness of breath . EXAM: PORTABLE CHEST 1 VIEW COMPARISON:  09/16/2016. FINDINGS: Cardiomegaly with mild pulmonary venous congestion. Mild bilateral interstitial prominence. Findings suggest mild CHF. No pleural effusion or pneumothorax. No acute bony abnormality . IMPRESSION: Findings suggest mild congestive heart failure with mild interstitial edema . Electronically Signed   By: Marcello Moores  Register   On: 09/18/2016 11:55   Dg Chest Port 1 View  Result Date: 09/16/2016 CLINICAL DATA:  Acute respiratory failure with hypoxemia. EXAM: PORTABLE CHEST 1 VIEW COMPARISON:  09/14/2016 FINDINGS: 0716 hours. The cardio pericardial silhouette is enlarged. There is pulmonary vascular congestion without overt pulmonary edema. The visualized bony structures of the thorax are intact. Telemetry leads overlie the chest. IMPRESSION: Stable exam.  Cardiomegaly with pulmonary vascular congestion. Electronically Signed   By: Misty Stanley M.D.   On: 09/16/2016 08:34   Dg Chest Port 1 View  Result Date: 09/14/2016 CLINICAL DATA:  Respiratory failure EXAM: PORTABLE CHEST 1 VIEW COMPARISON:  September 12, 2016 FINDINGS: There is no edema or consolidation. There is stable cardiomegaly with pulmonary venous hypertension. No adenopathy. There is postoperative change in the lower cervical spine. IMPRESSION: Persistent pulmonary vascular congestion without edema or consolidation. Electronically Signed   By: Lowella Grip III M.D.   On: 09/14/2016 10:36   Dg Chest Port 1 View  Result Date: 09/13/2016 CLINICAL DATA:  Shortness of breath since dialysis today. EXAM: PORTABLE CHEST 1 VIEW COMPARISON:  Frontal and lateral  views 03/12/2016 FINDINGS: Mild cardiomegaly, new. Mediastinal contours are unchanged. Central pulmonary vascular engorgement. No  confluent airspace disease. No pleural fluid or pneumothorax. Surgical hardware in the lower cervical spine is partially included. IMPRESSION: Cardiomegaly and vascular congestion. Electronically Signed   By: Jeb Levering M.D.   On: 09/13/2016 00:05     Subjective:  Feeling much better today.   Discharge Exam: Vitals:   09/22/16 0537 09/22/16 0756  BP: (!) 148/61   Pulse: 60   Resp: 20   Temp: 98.2 F (36.8 C)   SpO2: 96% 96%   Vitals:   09/21/16 1930 09/21/16 2103 09/22/16 0537 09/22/16 0756  BP:  (!) 160/72 (!) 148/61   Pulse: 72 70 60   Resp: 16 18 20    Temp:  98.4 F (36.9 C) 98.2 F (36.8 C)   TempSrc:  Oral Oral   SpO2: 98% 98% 96% 96%  Weight:   100.4 kg (221 lb 6.4 oz)   Height:        General: Pt is alert, awake, not in acute distress Cardiovascular: RRR, S1/S2 +, no rubs, no gallops Respiratory: CTA bilaterally, no wheezing, no rhonchi Abdominal: Soft, NT, ND, bowel sounds + Extremities: no edema, no cyanosis    The results of significant diagnostics from this hospitalization (including imaging, microbiology, ancillary and laboratory) are listed below for reference.     Microbiology: Recent Results (from the past 240 hour(s))  MRSA PCR Screening     Status: None   Collection Time: 09/13/16  4:07 AM  Result Value Ref Range Status   MRSA by PCR NEGATIVE NEGATIVE Final    Comment:        The GeneXpert MRSA Assay (FDA approved for NASAL specimens only), is one component of a comprehensive MRSA colonization surveillance program. It is not intended to diagnose MRSA infection nor to guide or monitor treatment for MRSA infections.      Labs: BNP (last 3 results)  Recent Labs  01/24/16 1740 09/12/16 2338  BNP 182.0* 741.2*   Basic Metabolic Panel:  Recent Labs Lab 09/16/16 0336 09/17/16 0846 09/19/16 0504  09/21/16 0359  NA 135 130* 134* 131*  K 4.8 4.9 4.8 4.4  CL 93* 90* 94* 93*  CO2 23 16* 21* 20*  GLUCOSE 180* 239* 246* 190*  BUN 83* 130* 130* 136*  CREATININE 12.53* 15.77* 14.14* 13.66*  CALCIUM 9.4 9.1 8.9 8.2*  PHOS  --  7.0* 6.7* 8.2*   Liver Function Tests:  Recent Labs Lab 09/17/16 0846 09/19/16 0504 09/21/16 0359  ALBUMIN 3.8 3.6 3.2*   CBC:  Recent Labs Lab 09/16/16 0336 09/17/16 0846 09/19/16 0504 09/21/16 0359  WBC 9.6 13.4* 16.1* 12.2*  NEUTROABS  --  12.4*  --   --   HGB 11.2* 11.0* 11.6* 11.6*  HCT 34.4* 32.9* 35.1* 34.8*  MCV 96.9 96.2 97.0 96.7  PLT 155 150 168 140*   CBG:  Recent Labs Lab 09/21/16 1615  GLUCAP 175*   Urinalysis    Component Value Date/Time   COLORURINE YELLOW 04/06/2014 1030   APPEARANCEUR CLEAR 04/06/2014 1030   LABSPEC 1.020 04/06/2014 1030   PHURINE 8.0 04/06/2014 1030   GLUCOSEU 100 (A) 04/06/2014 1030   HGBUR MODERATE (A) 04/06/2014 1030   BILIRUBINUR NEGATIVE 04/06/2014 1030   KETONESUR NEGATIVE 04/06/2014 1030   PROTEINUR >300 (A) 04/06/2014 1030   UROBILINOGEN 0.2 04/06/2014 1030   NITRITE NEGATIVE 04/06/2014 1030   LEUKOCYTESUR NEGATIVE 04/06/2014 1030   Microbiology Recent Results (from the past 240 hour(s))  MRSA PCR Screening     Status: None  Collection Time: 09/13/16  4:07 AM  Result Value Ref Range Status   MRSA by PCR NEGATIVE NEGATIVE Final    Comment:        The GeneXpert MRSA Assay (FDA approved for NASAL specimens only), is one component of a comprehensive MRSA colonization surveillance program. It is not intended to diagnose MRSA infection nor to guide or monitor treatment for MRSA infections.     Time coordinating discharge: Over 30 minutes SIGNED:  Orvan Falconer, MD FACP [facility] 09/22/2016, 11:07 AM   If 7PM-7AM, please contact night-coverage [email or web address] Password [email or web address]

## 2016-09-22 NOTE — Progress Notes (Signed)
Subjective: Interval History: Patient feels good and ready to go home. Feels fine . No cough and difficulty in breathiing  Objective: Vital signs in last 24 hours: Temp:  [97.9 F (36.6 C)-98.4 F (36.9 C)] 98.2 F (36.8 C) (09/01 0537) Pulse Rate:  [60-79] 60 (09/01 0537) Resp:  [16-23] 20 (09/01 0537) BP: (148-199)/(61-87) 148/61 (09/01 0537) SpO2:  [96 %-98 %] 96 % (09/01 0756) Weight:  [100.4 kg (221 lb 6.4 oz)] 100.4 kg (221 lb 6.4 oz) (09/01 0537) Weight change:   Intake/Output from previous day: 08/31 0701 - 09/01 0700 In: 720 [P.O.:720] Out: 3000  Intake/Output this shift: No intake/output data recorded.  Generally patient is alert and in no apparent distress Chest is clear to auscultation Heart exam regular rate and rhythm no murmur Extremities no  edema  Lab Results:  Recent Labs  09/21/16 0359  WBC 12.2*  HGB 11.6*  HCT 34.8*  PLT 140*   BMET:   Recent Labs  09/21/16 0359  NA 131*  K 4.4  CL 93*  CO2 20*  GLUCOSE 190*  BUN 136*  CREATININE 13.66*  CALCIUM 8.2*   No results for input(s): PTH in the last 72 hours. Iron Studies: No results for input(s): IRON, TIBC, TRANSFERRIN, FERRITIN in the last 72 hours.  Studies/Results: No results found.  I have reviewed the patient's current medications.  Assessment/Plan: Problem #1 difficulty breathing: Possibly a combination of fluid overload and exacerbation of his asthma. Feels better. We are able to remove 3 liters yesterday Problem #2 end-stage renal disease: He is status post hemodialysis yesterday and asymptomatic Problem #3 bone and mineral disorder: His calcium is range  and phosphorus remains high. His binder has been increased and advised to decrease his phosphorus intake Problem #4 hypertension: His blood pressure is high this morning. Problem #5. Anemia: His hemoglobin remains within our target goal. Problem #6 history of obstructive sleep apnea on CPAP Plan: 1] patient does not require  dialysis today 2] His dialysis will be tomorrow on Monday and can be done as out patient if discharged    LOS: 9 days   [name] S 09/22/2016,9:01 AM

## 2016-09-24 DIAGNOSIS — D631 Anemia in chronic kidney disease: Secondary | ICD-10-CM | POA: Diagnosis not present

## 2016-09-24 DIAGNOSIS — Z23 Encounter for immunization: Secondary | ICD-10-CM | POA: Diagnosis not present

## 2016-09-24 DIAGNOSIS — N186 End stage renal disease: Secondary | ICD-10-CM | POA: Diagnosis not present

## 2016-09-24 DIAGNOSIS — N2581 Secondary hyperparathyroidism of renal origin: Secondary | ICD-10-CM | POA: Diagnosis not present

## 2016-09-24 DIAGNOSIS — D509 Iron deficiency anemia, unspecified: Secondary | ICD-10-CM | POA: Diagnosis not present

## 2016-09-26 DIAGNOSIS — N2581 Secondary hyperparathyroidism of renal origin: Secondary | ICD-10-CM | POA: Diagnosis not present

## 2016-09-26 DIAGNOSIS — D631 Anemia in chronic kidney disease: Secondary | ICD-10-CM | POA: Diagnosis not present

## 2016-09-26 DIAGNOSIS — D509 Iron deficiency anemia, unspecified: Secondary | ICD-10-CM | POA: Diagnosis not present

## 2016-09-26 DIAGNOSIS — N186 End stage renal disease: Secondary | ICD-10-CM | POA: Diagnosis not present

## 2016-09-26 DIAGNOSIS — Z23 Encounter for immunization: Secondary | ICD-10-CM | POA: Diagnosis not present

## 2016-09-28 DIAGNOSIS — Z23 Encounter for immunization: Secondary | ICD-10-CM | POA: Diagnosis not present

## 2016-09-28 DIAGNOSIS — D509 Iron deficiency anemia, unspecified: Secondary | ICD-10-CM | POA: Diagnosis not present

## 2016-09-28 DIAGNOSIS — D631 Anemia in chronic kidney disease: Secondary | ICD-10-CM | POA: Diagnosis not present

## 2016-09-28 DIAGNOSIS — N186 End stage renal disease: Secondary | ICD-10-CM | POA: Diagnosis not present

## 2016-09-28 DIAGNOSIS — N2581 Secondary hyperparathyroidism of renal origin: Secondary | ICD-10-CM | POA: Diagnosis not present

## 2016-10-01 DIAGNOSIS — N186 End stage renal disease: Secondary | ICD-10-CM | POA: Diagnosis not present

## 2016-10-01 DIAGNOSIS — D509 Iron deficiency anemia, unspecified: Secondary | ICD-10-CM | POA: Diagnosis not present

## 2016-10-01 DIAGNOSIS — D631 Anemia in chronic kidney disease: Secondary | ICD-10-CM | POA: Diagnosis not present

## 2016-10-01 DIAGNOSIS — Z23 Encounter for immunization: Secondary | ICD-10-CM | POA: Diagnosis not present

## 2016-10-01 DIAGNOSIS — N2581 Secondary hyperparathyroidism of renal origin: Secondary | ICD-10-CM | POA: Diagnosis not present

## 2016-10-03 DIAGNOSIS — N2581 Secondary hyperparathyroidism of renal origin: Secondary | ICD-10-CM | POA: Diagnosis not present

## 2016-10-03 DIAGNOSIS — D631 Anemia in chronic kidney disease: Secondary | ICD-10-CM | POA: Diagnosis not present

## 2016-10-03 DIAGNOSIS — D509 Iron deficiency anemia, unspecified: Secondary | ICD-10-CM | POA: Diagnosis not present

## 2016-10-03 DIAGNOSIS — Z23 Encounter for immunization: Secondary | ICD-10-CM | POA: Diagnosis not present

## 2016-10-03 DIAGNOSIS — N186 End stage renal disease: Secondary | ICD-10-CM | POA: Diagnosis not present

## 2016-10-05 DIAGNOSIS — D509 Iron deficiency anemia, unspecified: Secondary | ICD-10-CM | POA: Diagnosis not present

## 2016-10-05 DIAGNOSIS — Z23 Encounter for immunization: Secondary | ICD-10-CM | POA: Diagnosis not present

## 2016-10-05 DIAGNOSIS — N2581 Secondary hyperparathyroidism of renal origin: Secondary | ICD-10-CM | POA: Diagnosis not present

## 2016-10-05 DIAGNOSIS — N186 End stage renal disease: Secondary | ICD-10-CM | POA: Diagnosis not present

## 2016-10-05 DIAGNOSIS — D631 Anemia in chronic kidney disease: Secondary | ICD-10-CM | POA: Diagnosis not present

## 2016-10-08 ENCOUNTER — Encounter: Payer: Self-pay | Admitting: Internal Medicine

## 2016-10-08 ENCOUNTER — Ambulatory Visit (INDEPENDENT_AMBULATORY_CARE_PROVIDER_SITE_OTHER): Payer: Medicare Other | Admitting: Internal Medicine

## 2016-10-08 VITALS — BP 142/70 | HR 72 | Temp 98.8°F | Wt 224.0 lb

## 2016-10-08 DIAGNOSIS — G4733 Obstructive sleep apnea (adult) (pediatric): Secondary | ICD-10-CM | POA: Diagnosis not present

## 2016-10-08 DIAGNOSIS — E1121 Type 2 diabetes mellitus with diabetic nephropathy: Secondary | ICD-10-CM

## 2016-10-08 DIAGNOSIS — D631 Anemia in chronic kidney disease: Secondary | ICD-10-CM | POA: Diagnosis not present

## 2016-10-08 DIAGNOSIS — J810 Acute pulmonary edema: Secondary | ICD-10-CM | POA: Diagnosis not present

## 2016-10-08 DIAGNOSIS — D509 Iron deficiency anemia, unspecified: Secondary | ICD-10-CM | POA: Diagnosis not present

## 2016-10-08 DIAGNOSIS — R5383 Other fatigue: Secondary | ICD-10-CM | POA: Diagnosis not present

## 2016-10-08 DIAGNOSIS — N2581 Secondary hyperparathyroidism of renal origin: Secondary | ICD-10-CM | POA: Diagnosis not present

## 2016-10-08 DIAGNOSIS — I10 Essential (primary) hypertension: Secondary | ICD-10-CM

## 2016-10-08 DIAGNOSIS — Z9989 Dependence on other enabling machines and devices: Secondary | ICD-10-CM | POA: Diagnosis not present

## 2016-10-08 DIAGNOSIS — G44209 Tension-type headache, unspecified, not intractable: Secondary | ICD-10-CM

## 2016-10-08 DIAGNOSIS — Z992 Dependence on renal dialysis: Secondary | ICD-10-CM

## 2016-10-08 DIAGNOSIS — Z23 Encounter for immunization: Secondary | ICD-10-CM | POA: Diagnosis not present

## 2016-10-08 DIAGNOSIS — N186 End stage renal disease: Secondary | ICD-10-CM

## 2016-10-08 MED ORDER — TRAMADOL HCL 50 MG PO TABS: 50.0000 mg | ORAL_TABLET | Freq: Every day | ORAL | 0 refills | Status: DC | PRN

## 2016-10-08 MED ORDER — METOPROLOL TARTRATE 75 MG PO TABS: 75.0000 mg | ORAL_TABLET | Freq: Two times a day (BID) | ORAL | 1 refills | Status: DC

## 2016-10-08 NOTE — Patient Instructions (Addendum)
Get your flu shot at dialysis.  Please let us know when you receive it.    Try to reduce benadryl to nightly use only.  Also avoid too much tramadol (no more than 1 in 24 hours due to sedation).

## 2016-10-08 NOTE — Progress Notes (Signed)
Location:   [place]   Place of Service:   Clinic  Provider: Norma Ignasiak L. Mariea Clonts, D.O., C.M.D.  Code Status: Full Goals of Care:  Advanced Directives 09/13/2016  Does Patient Have a Medical Advance Directive? No  Would patient like information on creating a medical advance directive? No - Patient declined  Pre-existing out of facility DNR order (yellow form or pink MOST form) -     Chief Complaint  Patient presents with  . Hospitalization Follow-up    09/12/2016 - 09/22/2016 (10 days)    HPI: Patient is a 56 y.o. male seen today for hospital follow up. Has ESRD on dialysis.  Was seen at [facility] for shortness of breath. Hospital course included bipap, Furosemide BNP was 573. Xray showed pulmonary congestion, nitropaste, hydralazine, rocephin which was converted to ceftin on discharge. Was on nebulizer treatment in hospital as well. Has completed the course of antibiotics. Is still short of breath at times. No fever, chills or diaphoresis since discharge. Wears Cpap at night when he sleeps.  Has dialysis M-W-F normally but during his hospital course they tried extra days without much benefit according to the wife.  Wife feels like the antibiotics helped some.   Headaches are persistent takes ultram with relief and tylenol. Had to have phenergan in the hospital for nausea with HA.   Past Medical History:  Diagnosis Date  . Acute edema of lung, unspecified   . Acute, but ill-defined, cerebrovascular disease   . Allergy   . Anemia   . 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)   . Anxiety   . Asthma   . Carpal tunnel syndrome   . Cellulitis and abscess of trunk   . Cholelithiasis 07/13/2014  . Chronic headaches   . Debility, unspecified   . Dermatophytosis of the body   . Dysrhythmia    history of  . Edema   . End stage renal disease on dialysis [facility])    "MWF; [facility]" (10/21/2014)  . Essential hypertension, benign   . GERD (gastroesophageal reflux  disease)   . Gout, unspecified   . HTN (hypertension)   . Hypertrophy of prostate without urinary obstruction and other lower urinary tract symptoms (LUTS)   . Hypotension, unspecified   . Impotence of organic origin   . Insomnia, unspecified   . Kidney replaced by transplant   . Localization-related (focal) (partial) epilepsy and epileptic syndromes with complex partial seizures, without mention of intractable epilepsy   . Lumbago   . Memory loss   . OSA on CPAP   . Other and unspecified hyperlipidemia   . Other chronic nonalcoholic liver disease   . Other malaise and fatigue   . Other nonspecific abnormal serum enzyme levels   . Pain in joint, lower leg   . Pain in joint, upper arm   . Pneumonia "several times"  . Renal dialysis status(V45.11) 02/05/2010   restarted 01/02/13 ofter renal trransplant failure  . Secondary hyperparathyroidism (of renal origin)   . Shortness of breath   . Sleep apnea   . Tension headache   . Unspecified constipation   . Unspecified essential hypertension   . Unspecified hereditary and idiopathic peripheral neuropathy   . Unspecified vitamin D deficiency     Past Surgical History:  Procedure Laterality Date  . AV FISTULA PLACEMENT Left ?2010   "forearm; at [facility]"  . BACK SURGERY    . CARDIAC CATHETERIZATION  03/21/2011  . CHOLECYSTECTOMY N/A 10/21/2014  Procedure: LAPAROSCOPIC CHOLECYSTECTOMY WITH INTRAOPERATIVE CHOLANGIOGRAM;  Surgeon: Autumn Messing III, MD;  Location: [place];  Service: General;  Laterality: N/A;  . COLONOSCOPY    . 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 KIDNEY TRANSPLANT  08/17/2011   [place]  10/21/2014   w/IOC  . LEFT HEART CATHETERIZATION WITH CORONARY ANGIOGRAM N/A 03/21/2011   Procedure: LEFT HEART CATHETERIZATION WITH CORONARY ANGIOGRAM;  Surgeon: [name], MD;  Location: [facility] C[facility] LAB;  Service: Cardiovascular;  Laterality: N/A;  . NEPHRECTOMY   08/2013   removed transplaned kidney  . POSTERIOR FUSION CERVICAL SPINE  06/25/2012   for spinal stenosis  . VASECTOMY  2010    Allergies  Allergen Reactions  . Codeine Nausea And Vomiting and Nausea Only    Altered mental status    Outpatient Encounter Prescriptions as of 10/08/2016  Medication Sig  . metoCLOPramide (REGLAN) 10 MG tablet Take 10 mg by mouth as needed for nausea.  Marland Kitchen acetaminophen (TYLENOL) 500 MG tablet Take 1 tablet (500 mg total) by mouth every 6 (six) hours as needed for moderate pain.  Marland Kitchen amLODipine (NORVASC) 10 MG tablet One twice daily to control BP  . aspirin EC 81 MG tablet Take 81 mg by mouth daily.  Marland Kitchen b complex-vitamin c-folic acid (NEPHRO-VITE) 0.8 MG TABS tablet TAKE 1 TABLET BY MOUTH EVERY DAY  . budesonide-formoterol (SYMBICORT) 80-4.5 MCG/ACT inhaler Inhale 2 puffs into the lungs 2 (two) times daily.  . calcitRIOL (ROCALTROL) 0.25 MCG capsule Take 1 capsule (0.25 mcg total) by mouth every Monday, Wednesday, and Friday with hemodialysis.  . Cholecalciferol (VITAMIN D3) 5000 UNITS TABS Take 1 tablet by mouth daily.  . clonazePAM (KLONOPIN) 0.5 MG tablet Take 1 tablet (0.5 mg total) by mouth 2 (two) times daily.  . diphenhydrAMINE (BENADRYL) 25 MG tablet Take 50 mg by mouth 2 (two) times daily as needed for allergies.   Marland Kitchen guaiFENesin-dextromethorphan (ROBITUSSIN DM) 100-10 MG/5ML syrup Take 10 mLs by mouth every 4 (four) hours as needed for cough.  . hydrALAZINE (APRESOLINE) 50 MG tablet Take 1 tablet (50 mg total) by mouth every 8 (eight) hours.  . Metoprolol Tartrate 75 MG TABS Take 75 mg by mouth 2 (two) times daily.  Marland Kitchen omeprazole (PRILOSEC) 20 MG capsule TAKE 1 CAPSULE (20 MG TOTAL) BY MOUTH 2 (TWO) TIMES DAILY BEFORE A MEAL.  . pravastatin (PRAVACHOL) 40 MG tablet Take 1 tablet (40 mg total) by mouth every evening.  . sevelamer carbonate (RENVELA) 800 MG tablet Take 2 tablets (1,600 mg total) by mouth with snacks.  . sevelamer carbonate (RENVELA) 800 MG  tablet Take 3 tablets (2,400 mg total) by mouth 3 (three) times daily with meals.  . traMADol (ULTRAM) 50 MG tablet One up to 4 times daly if needed for pain (Patient taking differently: No sig reported)  . [DISCONTINUED] cefUROXime (CEFTIN) 500 MG tablet Take 1 tablet (500 mg total) by mouth daily.  . [DISCONTINUED] metoCLOPramide (REGLAN) 10 MG tablet One before each meal to increase stomach emptying (Patient taking differently: Take 10 mg by mouth 3 (three) times daily before meals. One before each meal to increase stomach emptying)  . [DISCONTINUED] metoprolol tartrate 75 MG TABS Take 75 mg by mouth 2 (two) times daily.   No facility-administered encounter medications on file as of 10/08/2016.     Review of Systems:  Review of Systems  Constitutional: Positive for malaise/fatigue. Negative for chills and fever.  HENT: Negative  for sore throat.        Headaches   Eyes: Positive for blurred vision.  Respiratory: Positive for cough, shortness of breath and wheezing. Negative for stridor.   Cardiovascular: Positive for chest pain and orthopnea. Negative for palpitations and leg swelling.  Gastrointestinal: Positive for nausea. Negative for abdominal pain, constipation, diarrhea and vomiting.  Genitourinary:       Oliguric  Neurological: Positive for dizziness, weakness and headaches.  Psychiatric/Behavioral: Negative for substance abuse. The patient is nervous/anxious. The patient does not have insomnia.     Health Maintenance  Topic Date Due  . OPHTHALMOLOGY EXAM  01/29/1972  . FOOT EXAM  04/29/2014  . PNEUMOCOCCAL POLYSACCHARIDE VACCINE (2) 11/01/2015  . TETANUS/TDAP  01/23/2016  . HEMOGLOBIN A1C  08/13/2016  . INFLUENZA VACCINE  08/22/2016  . URINE MICROALBUMIN  02/14/2017  . COLONOSCOPY  08/30/2019  . Hepatitis C Screening  Completed  . HIV Screening  Completed    Physical Exam: Vitals:   10/08/16 1558  BP: (!) 142/70  Pulse: 72  Temp: 98.8 F (37.1 C)  TempSrc: Oral    SpO2: 97%  Weight: 224 lb (101.6 kg)   Body mass index is 35.08 kg/m. Physical Exam  Constitutional: He appears well-developed and well-nourished. No distress.  Cardiovascular: Normal rate, regular rhythm, normal heart sounds and intact distal pulses.   Pulmonary/Chest: Breath sounds normal. No respiratory distress. He has no wheezes. He has no rales.  Neurological:  Lethargic   Skin: Skin is warm and dry. Capillary refill takes less than 2 seconds. He is not diaphoretic.    Labs reviewed: Basic Metabolic Panel:  Recent Labs  09/17/16 0846 09/19/16 0504 09/21/16 0359  NA 130* 134* 131*  K 4.9 4.8 4.4  CL 90* 94* 93*  CO2 16* 21* 20*  GLUCOSE 239* 246* 190*  BUN 130* 130* 136*  CREATININE 15.77* 14.14* 13.66*  CALCIUM 9.1 8.9 8.2*  PHOS 7.0* 6.7* 8.2*   Liver Function Tests:  Recent Labs  06/19/16 1800 09/12/16 2338 09/13/16 0416  09/17/16 0846 09/19/16 0504 09/21/16 0359  AST 17 21 22   --   --   --   --   ALT 21 18 18   --   --   --   --   ALKPHOS 67 76 71  --   --   --   --   BILITOT 0.7 0.9 1.0  --   --   --   --   PROT 6.8 8.0 7.4  --   --   --   --   ALBUMIN 3.8 4.2 3.9  < > 3.8 3.6 3.2*  < > = values in this interval not displayed.  Recent Labs  06/19/16 1800  LIPASE 84*   No results for input(s): AMMONIA in the last 8760 hours. CBC:  Recent Labs  02/14/16 0904  09/12/16 2338  09/17/16 0846 09/19/16 0504 09/21/16 0359  WBC 4.6  < > 10.0  < > 13.4* 16.1* 12.2*  NEUTROABS 2,990  --  8.9*  --  12.4*  --   --   HGB 11.7*  < > 11.2*  < > 11.0* 11.6* 11.6*  HCT 35.9*  < > 34.6*  < > 32.9* 35.1* 34.8*  MCV 96.8  < > 97.5  < > 96.2 97.0 96.7  PLT 97*  < > 117*  < > 150 168 140*  < > = values in this interval not displayed. Lipid Panel: No results for input(s): CHOL, HDL,  LDLCALC, TRIG, CHOLHDL, LDLDIRECT in the last 8760 hours. Lab Results  Component Value Date   HGBA1C 5.4 02/14/2016    Procedures since last visit: Dg Chest Port 1  View  Result Date: 09/18/2016 CLINICAL DATA:  Shortness of breath and cough tonight. EXAM: PORTABLE CHEST 1 VIEW COMPARISON:  September 18, 2016 6:11 p.m. FINDINGS: The mediastinal contour is normal. The heart size is enlarged. There is mild interstitial edema. There is no focal pneumonia or pleural effusion. The osseous structures are stable. IMPRESSION: Mild congestive heart failure. Electronically Signed   By: Abelardo Diesel M.D.   On: 09/18/2016 20:10   Dg Chest Port 1 View  Result Date: 09/18/2016 CLINICAL DATA:  Difficulty breathing. Coughing. Shortness of breath . EXAM: PORTABLE CHEST 1 VIEW COMPARISON:  09/16/2016. FINDINGS: Cardiomegaly with mild pulmonary venous congestion. Mild bilateral interstitial prominence. Findings suggest mild CHF. No pleural effusion or pneumothorax. No acute bony abnormality . IMPRESSION: Findings suggest mild congestive heart failure with mild interstitial edema . Electronically Signed   By: Marcello Moores  Register   On: 09/18/2016 11:55   Dg Chest Port 1 View  Result Date: 09/16/2016 CLINICAL DATA:  Acute respiratory failure with hypoxemia. EXAM: PORTABLE CHEST 1 VIEW COMPARISON:  09/14/2016 FINDINGS: 0716 hours. The cardio pericardial silhouette is enlarged. There is pulmonary vascular congestion without overt pulmonary edema. The visualized bony structures of the thorax are intact. Telemetry leads overlie the chest. IMPRESSION: Stable exam.  Cardiomegaly with pulmonary vascular congestion. Electronically Signed   By: Misty Stanley M.D.   On: 09/16/2016 08:34   Dg Chest Port 1 View  Result Date: 09/14/2016 CLINICAL DATA:  Respiratory failure EXAM: PORTABLE CHEST 1 VIEW COMPARISON:  September 12, 2016 FINDINGS: There is no edema or consolidation. There is stable cardiomegaly with pulmonary venous hypertension. No adenopathy. There is postoperative change in the lower cervical spine. IMPRESSION: Persistent pulmonary vascular congestion without edema or consolidation.  Electronically Signed   By: Lowella Grip III M.D.   On: 09/14/2016 10:36   Dg Chest Port 1 View  Result Date: 09/13/2016 CLINICAL DATA:  Shortness of breath since dialysis today. EXAM: PORTABLE CHEST 1 VIEW COMPARISON:  Frontal and lateral views 03/12/2016 FINDINGS: Mild cardiomegaly, new. Mediastinal contours are unchanged. Central pulmonary vascular engorgement. No confluent airspace disease. No pleural fluid or pneumothorax. Surgical hardware in the lower cervical spine is partially included. IMPRESSION: Cardiomegaly and vascular congestion. Electronically Signed   By: Jeb Levering M.D.   On: 09/13/2016 00:05    Assessment/Plan 1. Tension headache Managed on tramadol and acetaminophen. - traMADol (ULTRAM) 50 MG tablet; Take 1 tablet (50 mg total) by mouth 4 (four) times daily as needed for severe pain.  Dispense: 120 tablet; Refill: 0  2. Essential hypertension Lopressor, hydralizine increased in hospital   3. OSA on CPAP Uses CPAP at bedtime.   4. Acute pulmonary edema (HCC) Clear today.  5. Type 2 diabetes mellitus with diabetic nephropathy, without long-term current use of insulin (HCC) Not on insulin at this time. A1c in January was 5.4.   6. ESRD on dialysis [facility]) Dialysis M-W-F   7.  Lethargy Advised his wife not to use benadryl except at bedtime and to reduce use of tramadol which may be accumulating in his system with his ESRD--I'm not certain this will change, but certainly is not helping his already overwhelming fatigue from dialysis. She reported his allergies are terrible without it and other medications are not effective  He gets his flu shot at dialysis.  I asked his wife to call and tell us the date since we don't receive communications from them.  Labs/tests ordered:  No orders of the defined types were placed in this encounter.   Next appt:  11/15/2016  Harika Laidlaw L. Talal Fritchman, D.O. [facility] [address], [place] [address] Cell Phone (Mon-Fri 8am-5pm):  [phone] On Call:  [phone] & follow prompts after 5pm & weekends Office Phone:  [phone] Office Fax:  [phone]

## 2016-10-09 ENCOUNTER — Other Ambulatory Visit: Payer: Self-pay | Admitting: *Deleted

## 2016-10-09 MED ORDER — METOPROLOL TARTRATE 75 MG PO TABS: 75.0000 mg | ORAL_TABLET | Freq: Two times a day (BID) | ORAL | 1 refills | Status: DC

## 2016-10-09 NOTE — Telephone Encounter (Signed)
[facility]

## 2016-10-10 ENCOUNTER — Telehealth: Payer: Self-pay | Admitting: *Deleted

## 2016-10-10 DIAGNOSIS — Z23 Encounter for immunization: Secondary | ICD-10-CM | POA: Diagnosis not present

## 2016-10-10 DIAGNOSIS — D509 Iron deficiency anemia, unspecified: Secondary | ICD-10-CM | POA: Diagnosis not present

## 2016-10-10 DIAGNOSIS — D631 Anemia in chronic kidney disease: Secondary | ICD-10-CM | POA: Diagnosis not present

## 2016-10-10 DIAGNOSIS — N186 End stage renal disease: Secondary | ICD-10-CM | POA: Diagnosis not present

## 2016-10-10 DIAGNOSIS — N2581 Secondary hyperparathyroidism of renal origin: Secondary | ICD-10-CM | POA: Diagnosis not present

## 2016-10-10 NOTE — Telephone Encounter (Signed)
Anderson Malta with [facility] called and stated that patient's medication Metoprolol Tartrate 75mg  is currently unavailable right now and they need an alternative to give to patient. Please Advise.   [phone] Reference #:[id number]

## 2016-10-11 NOTE — Telephone Encounter (Signed)
Recommend pt get it locally until [facility] gets his regular prescription back in.

## 2016-10-12 DIAGNOSIS — N2581 Secondary hyperparathyroidism of renal origin: Secondary | ICD-10-CM | POA: Diagnosis not present

## 2016-10-12 DIAGNOSIS — D631 Anemia in chronic kidney disease: Secondary | ICD-10-CM | POA: Diagnosis not present

## 2016-10-12 DIAGNOSIS — D509 Iron deficiency anemia, unspecified: Secondary | ICD-10-CM | POA: Diagnosis not present

## 2016-10-12 DIAGNOSIS — N186 End stage renal disease: Secondary | ICD-10-CM | POA: Diagnosis not present

## 2016-10-12 DIAGNOSIS — Z23 Encounter for immunization: Secondary | ICD-10-CM | POA: Diagnosis not present

## 2016-10-12 NOTE — Telephone Encounter (Signed)
LMOM for wife, [name] to return call.

## 2016-10-15 DIAGNOSIS — N2581 Secondary hyperparathyroidism of renal origin: Secondary | ICD-10-CM | POA: Diagnosis not present

## 2016-10-15 DIAGNOSIS — D509 Iron deficiency anemia, unspecified: Secondary | ICD-10-CM | POA: Diagnosis not present

## 2016-10-15 DIAGNOSIS — Z23 Encounter for immunization: Secondary | ICD-10-CM | POA: Diagnosis not present

## 2016-10-15 DIAGNOSIS — D631 Anemia in chronic kidney disease: Secondary | ICD-10-CM | POA: Diagnosis not present

## 2016-10-15 DIAGNOSIS — N186 End stage renal disease: Secondary | ICD-10-CM | POA: Diagnosis not present

## 2016-10-17 DIAGNOSIS — D509 Iron deficiency anemia, unspecified: Secondary | ICD-10-CM | POA: Diagnosis not present

## 2016-10-17 DIAGNOSIS — N186 End stage renal disease: Secondary | ICD-10-CM | POA: Diagnosis not present

## 2016-10-17 DIAGNOSIS — Z23 Encounter for immunization: Secondary | ICD-10-CM | POA: Diagnosis not present

## 2016-10-17 DIAGNOSIS — D631 Anemia in chronic kidney disease: Secondary | ICD-10-CM | POA: Diagnosis not present

## 2016-10-17 DIAGNOSIS — N2581 Secondary hyperparathyroidism of renal origin: Secondary | ICD-10-CM | POA: Diagnosis not present

## 2016-10-19 DIAGNOSIS — Z23 Encounter for immunization: Secondary | ICD-10-CM | POA: Diagnosis not present

## 2016-10-19 DIAGNOSIS — N2581 Secondary hyperparathyroidism of renal origin: Secondary | ICD-10-CM | POA: Diagnosis not present

## 2016-10-19 DIAGNOSIS — D509 Iron deficiency anemia, unspecified: Secondary | ICD-10-CM | POA: Diagnosis not present

## 2016-10-19 DIAGNOSIS — D631 Anemia in chronic kidney disease: Secondary | ICD-10-CM | POA: Diagnosis not present

## 2016-10-19 DIAGNOSIS — N186 End stage renal disease: Secondary | ICD-10-CM | POA: Diagnosis not present

## 2016-10-21 DIAGNOSIS — Z992 Dependence on renal dialysis: Secondary | ICD-10-CM | POA: Diagnosis not present

## 2016-10-21 DIAGNOSIS — T8612 Kidney transplant failure: Secondary | ICD-10-CM | POA: Diagnosis not present

## 2016-10-21 DIAGNOSIS — N186 End stage renal disease: Secondary | ICD-10-CM | POA: Diagnosis not present

## 2016-10-22 DIAGNOSIS — E8779 Other fluid overload: Secondary | ICD-10-CM | POA: Diagnosis not present

## 2016-10-22 DIAGNOSIS — N2581 Secondary hyperparathyroidism of renal origin: Secondary | ICD-10-CM | POA: Diagnosis not present

## 2016-10-22 DIAGNOSIS — D509 Iron deficiency anemia, unspecified: Secondary | ICD-10-CM | POA: Diagnosis not present

## 2016-10-22 DIAGNOSIS — N186 End stage renal disease: Secondary | ICD-10-CM | POA: Diagnosis not present

## 2016-10-22 MED ORDER — METOPROLOL TARTRATE 50 MG PO TABS: ORAL_TABLET | ORAL | 3 refills | Status: DC

## 2016-10-22 NOTE — Telephone Encounter (Signed)
That’s fine with me

## 2016-10-22 NOTE — Telephone Encounter (Signed)
Rx faxed to [facility]. Medication list updated and patient wife notified. Left detailed message on VM.

## 2016-10-22 NOTE — Telephone Encounter (Signed)
Wife, [name] called and requested the Metoprolol 50mg  to take One and a half tablet to make 75mg  so they can get it cheaper through mail order and a 90 day supply. Please Advise.

## 2016-10-24 DIAGNOSIS — E8779 Other fluid overload: Secondary | ICD-10-CM | POA: Diagnosis not present

## 2016-10-24 DIAGNOSIS — N186 End stage renal disease: Secondary | ICD-10-CM | POA: Diagnosis not present

## 2016-10-24 DIAGNOSIS — D509 Iron deficiency anemia, unspecified: Secondary | ICD-10-CM | POA: Diagnosis not present

## 2016-10-24 DIAGNOSIS — N2581 Secondary hyperparathyroidism of renal origin: Secondary | ICD-10-CM | POA: Diagnosis not present

## 2016-10-26 ENCOUNTER — Telehealth: Payer: Self-pay | Admitting: Gastroenterology

## 2016-10-26 ENCOUNTER — Emergency Department (HOSPITAL_COMMUNITY)
Admission: EM | Admit: 2016-10-26 | Discharge: 2016-10-26 | Disposition: A | Discharge status: Home / Self Care | Payer: Medicare Other | Attending: Emergency Medicine | Admitting: Emergency Medicine

## 2016-10-26 ENCOUNTER — Encounter (HOSPITAL_COMMUNITY): Payer: Self-pay | Admitting: Emergency Medicine

## 2016-10-26 DIAGNOSIS — N2581 Secondary hyperparathyroidism of renal origin: Secondary | ICD-10-CM | POA: Diagnosis not present

## 2016-10-26 DIAGNOSIS — N186 End stage renal disease: Secondary | ICD-10-CM | POA: Diagnosis not present

## 2016-10-26 DIAGNOSIS — F419 Anxiety disorder, unspecified: Secondary | ICD-10-CM | POA: Diagnosis not present

## 2016-10-26 DIAGNOSIS — I120 Hypertensive chronic kidney disease with stage 5 chronic kidney disease or end stage renal disease: Secondary | ICD-10-CM | POA: Insufficient documentation

## 2016-10-26 DIAGNOSIS — J45909 Unspecified asthma, uncomplicated: Secondary | ICD-10-CM | POA: Insufficient documentation

## 2016-10-26 DIAGNOSIS — E1122 Type 2 diabetes mellitus with diabetic chronic kidney disease: Secondary | ICD-10-CM | POA: Insufficient documentation

## 2016-10-26 DIAGNOSIS — N3000 Acute cystitis without hematuria: Secondary | ICD-10-CM | POA: Diagnosis not present

## 2016-10-26 DIAGNOSIS — Z992 Dependence on renal dialysis: Secondary | ICD-10-CM | POA: Insufficient documentation

## 2016-10-26 DIAGNOSIS — R197 Diarrhea, unspecified: Secondary | ICD-10-CM | POA: Diagnosis not present

## 2016-10-26 DIAGNOSIS — Z9049 Acquired absence of other specified parts of digestive tract: Secondary | ICD-10-CM | POA: Diagnosis not present

## 2016-10-26 DIAGNOSIS — Z940 Kidney transplant status: Secondary | ICD-10-CM | POA: Insufficient documentation

## 2016-10-26 DIAGNOSIS — E8779 Other fluid overload: Secondary | ICD-10-CM | POA: Diagnosis not present

## 2016-10-26 DIAGNOSIS — R195 Other fecal abnormalities: Secondary | ICD-10-CM | POA: Diagnosis present

## 2016-10-26 DIAGNOSIS — F17290 Nicotine dependence, other tobacco product, uncomplicated: Secondary | ICD-10-CM | POA: Insufficient documentation

## 2016-10-26 DIAGNOSIS — Z79899 Other long term (current) drug therapy: Secondary | ICD-10-CM | POA: Diagnosis not present

## 2016-10-26 DIAGNOSIS — D509 Iron deficiency anemia, unspecified: Secondary | ICD-10-CM | POA: Diagnosis not present

## 2016-10-26 LAB — COMPREHENSIVE METABOLIC PANEL
ALK PHOS: 78 U/L (ref 38–126)
ALT: 15 U/L — AB (ref 17–63)
AST: 26 U/L (ref 15–41)
Albumin: 3.8 g/dL (ref 3.5–5.0)
Anion gap: 14 (ref 5–15)
BUN: 20 mg/dL (ref 6–20)
CHLORIDE: 99 mmol/L — AB (ref 101–111)
CO2: 25 mmol/L (ref 22–32)
CREATININE: 7.91 mg/dL — AB (ref 0.61–1.24)
Calcium: 8.7 mg/dL — ABNORMAL LOW (ref 8.9–10.3)
GFR calc Af Amer: 8 mL/min — ABNORMAL LOW (ref >60)
GFR, EST NON AFRICAN AMERICAN: 7 mL/min — AB (ref >60)
Glucose, Bld: 173 mg/dL — ABNORMAL HIGH (ref 65–99)
Potassium: 3.5 mmol/L (ref 3.5–5.1)
SODIUM: 138 mmol/L (ref 135–145)
Total Bilirubin: 0.4 mg/dL (ref 0.3–1.2)
Total Protein: 6.7 g/dL (ref 6.5–8.1)

## 2016-10-26 LAB — CBC
HCT: 39.3 % (ref 39.0–52.0)
Hemoglobin: 12.4 g/dL — ABNORMAL LOW (ref 13.0–17.0)
MCH: 30.5 pg (ref 26.0–34.0)
MCHC: 31.6 g/dL (ref 30.0–36.0)
MCV: 96.6 fL (ref 78.0–100.0)
Platelets: 90 10*3/uL — ABNORMAL LOW (ref 150–400)
RBC: 4.07 MIL/uL — ABNORMAL LOW (ref 4.22–5.81)
RDW: 14.7 % (ref 11.5–15.5)
WBC: 3.7 10*3/uL — ABNORMAL LOW (ref 4.0–10.5)

## 2016-10-26 LAB — URINALYSIS, ROUTINE W REFLEX MICROSCOPIC
Bilirubin Urine: NEGATIVE
Glucose, UA: 50 mg/dL — AB
Hgb urine dipstick: NEGATIVE
Ketones, ur: NEGATIVE mg/dL
Nitrite: NEGATIVE
SPECIFIC GRAVITY, URINE: 1.012 (ref 1.005–1.030)
pH: 6 (ref 5.0–8.0)

## 2016-10-26 LAB — LIPASE, BLOOD: Lipase: 89 U/L — ABNORMAL HIGH (ref 11–51)

## 2016-10-26 LAB — C DIFFICILE QUICK SCREEN W PCR REFLEX
C DIFFICLE (CDIFF) ANTIGEN: POSITIVE — AB
C Diff toxin: NEGATIVE

## 2016-10-26 LAB — POC OCCULT BLOOD, ED: FECAL OCCULT BLD: NEGATIVE

## 2016-10-26 MED ORDER — SODIUM CHLORIDE 0.9 % IV BOLUS (SEPSIS)
1000.0000 mL | Freq: Once | INTRAVENOUS | Status: AC
  Administered 2016-10-26: 1000 mL via INTRAVENOUS

## 2016-10-26 MED ORDER — CEPHALEXIN 250 MG PO CAPS
500.0000 mg | ORAL_CAPSULE | Freq: Once | ORAL | Status: AC
  Administered 2016-10-26: 500 mg via ORAL
  Filled 2016-10-26: qty 2

## 2016-10-26 MED ORDER — ONDANSETRON 4 MG PO TBDP: 4.0000 mg | ORAL_TABLET | Freq: Three times a day (TID) | ORAL | 0 refills | Status: DC | PRN

## 2016-10-26 MED ORDER — CEPHALEXIN 500 MG PO CAPS: 500.0000 mg | ORAL_CAPSULE | Freq: Every day | ORAL | 0 refills | Status: DC

## 2016-10-26 NOTE — ED Triage Notes (Signed)
Pt c/o lower abdominal pain, diarrhea with bright red blood, and nausea x 4 day. Dialysis Mon/Wed/Fri.

## 2016-10-26 NOTE — ED Provider Notes (Signed)
[facility] DEPT Provider Note   CSN: [id number] Arrival date & time: 10/26/16  1630     History   Chief Complaint Chief Complaint  Patient presents with  . Diarrhea    HPI Kirk [name] is a 56 y.o. male with complex pmh significant for tubular ademonas, c diff + diarrhea (2017), reflux, ESRD via LUE fistaula MWF, right kidney transplant, cholecystectomy presents to ED for evaluation of bright red bloody diarrhea x 4 days, urgent in nature. More than 20+ episodes a day. Developed mild, intermittent midline lower abdominal discomfort and swelling that developed today with mild nausea, but no vomiting. Is tolerating PO fluids and food, has an appetite, but states everything he eats he looses it in BMs. No aggravating or alleviating factors. Called his gastroenterologist Dr. Ardis Hughs who recommended he came to ED for evaluation for possible dehydration. Additionally, he reports dysuria for "a long time".  No fevers, chills, vomiting. No recent travel. No sick contacts.   HPI  Past Medical History:  Diagnosis Date  . Acute edema of lung, unspecified   . Acute, but ill-defined, cerebrovascular disease   . Allergy   . Anemia   . 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)   . Anxiety   . Asthma   . Carpal tunnel syndrome   . Cellulitis and abscess of trunk   . Cholelithiasis 07/13/2014  . Chronic headaches   . Debility, unspecified   . Dermatophytosis of the body   . Dysrhythmia    history of  . Edema   . End stage renal disease on dialysis [facility])    "MWF; [facility] in [facility]" (10/21/2014)  . Essential hypertension, benign   . GERD (gastroesophageal reflux disease)   . Gout, unspecified   . HTN (hypertension)   . Hypertrophy of prostate without urinary obstruction and other lower urinary tract symptoms (LUTS)   . Hypotension, unspecified   . Impotence of organic origin   . Insomnia, unspecified   . Kidney replaced by transplant   . Localization-related (focal)  (partial) epilepsy and epileptic syndromes with complex partial seizures, without mention of intractable epilepsy   . Lumbago   . Memory loss   . OSA on CPAP   . Other and unspecified hyperlipidemia   . Other chronic nonalcoholic liver disease   . Other malaise and fatigue   . Other nonspecific abnormal serum enzyme levels   . Pain in joint, lower leg   . Pain in joint, upper arm   . Pneumonia "several times"  . Renal dialysis status(V45.11) 02/05/2010   restarted 01/02/13 ofter renal trransplant failure  . Secondary hyperparathyroidism (of renal origin)   . Shortness of breath   . Sleep apnea   . Tension headache   . Unspecified constipation   . Unspecified essential hypertension   . Unspecified hereditary and idiopathic peripheral neuropathy   . Unspecified vitamin D deficiency     Patient Active Problem List   Diagnosis Date Noted  . Pulmonary edema 09/13/2016  . Shunt malfunction 07/03/2016  . Nausea without vomiting 12/14/2015  . End stage renal disease ([place])   . Palpitations 06/29/2015  . Need for hepatitis C screening test 06/29/2015  . Fatigue 06/29/2015  . Myalgia 05/17/2015  . 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 Obesity hypoventilation syndrome ([place]) 12/09/2014  . Extrinsic asthma 12/09/2014  . Narcotic drug use 12/09/2014  . Hypersomnia with sleep apnea 12/09/2014  . SCC (squamous cell carcinoma), arm 11/09/2014  . Mild persistent chronic asthma without  complication [id number]  . ESRD on dialysis ([place]) 09/09/2014  . HLD (hyperlipidemia) 09/09/2014  . Dyspnea 06/23/2014  . Pancytopenia ([place]) 04/28/2014  . Thrombocytopenia ([place]) 04/10/2014  . Fungal dermatitis 03/17/2014  . History of surgical procedure 09/17/2013  . Headache(784.0) 04/28/2013  . Awaiting organ transplant 02/13/2013  . HTN (hypertension)   . DM (diabetes mellitus), type 2 with renal complications ([place])   . Tension headache   . Memory loss   . Anemia in chronic renal disease   .  Edema   . Pain in joint, lower leg 05/08/2012  . Thoracic or lumbosacral neuritis or radiculitis 03/27/2012  . Nerve root pain 03/27/2012  . History of kidney transplant 09/10/2011  . Hearing loss 08/16/2011  . Obesity 08/16/2011  . Difficulty hearing 08/16/2011  . OSA on CPAP 08/05/2011    Past Surgical History:  Procedure Laterality Date  . AV FISTULA PLACEMENT Left ?2010   "forearm; at [facility]"  . BACK SURGERY    . CARDIAC CATHETERIZATION  03/21/2011  . CHOLECYSTECTOMY N/A 10/21/2014   Procedure: LAPAROSCOPIC CHOLECYSTECTOMY WITH INTRAOPERATIVE CHOLANGIOGRAM;  Surgeon: Autumn Messing III, MD;  Location: [place];  Service: General;  Laterality: N/A;  . COLONOSCOPY    . 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 KIDNEY TRANSPLANT  08/17/2011   [place]  10/21/2014   w/IOC  . LEFT HEART CATHETERIZATION WITH CORONARY ANGIOGRAM N/A 03/21/2011   Procedure: LEFT HEART CATHETERIZATION WITH CORONARY ANGIOGRAM;  Surgeon: [name], MD;  Location: [facility] CATH LAB;  Service: Cardiovascular;  Laterality: N/A;  . NEPHRECTOMY  08/2013   removed transplaned kidney  . POSTERIOR FUSION CERVICAL SPINE  06/25/2012   for spinal stenosis  . VASECTOMY  2010       Home Medications    Prior to Admission medications   Medication Sig Start Date End Date Taking? Authorizing Provider  acetaminophen (TYLENOL) 500 MG tablet Take 1 tablet (500 mg total) by mouth every 6 (six) hours as needed for moderate pain. 09/12/14   Samuella Cota, MD  amLODipine (NORVASC) 10 MG tablet One twice daily to control BP 06/21/16   Lauree Chandler, NP  aspirin EC 81 MG tablet Take 81 mg by mouth daily.    [provider]  b complex-vitamin c-folic acid (NEPHRO-VITE) 0.8 MG TABS tablet TAKE 1 TABLET BY MOUTH EVERY DAY 03/29/16   Estill Dooms, MD  budesonide-formoterol [facility]) 80-4.5 MCG/ACT inhaler Inhale 2 puffs into the lungs 2 (two) times daily.  09/21/14   Bonnielee Haff, MD  calcitRIOL (ROCALTROL) 0.25 MCG capsule Take 1 capsule (0.25 mcg total) by mouth every Monday, Wednesday, and Friday with hemodialysis. 09/21/14   Bonnielee Haff, MD  cephALEXin (KEFLEX) 500 MG capsule Take 1 capsule (500 mg total) by mouth daily. 10/26/16 10/31/16  Kinnie Feil, PA-C  Cholecalciferol (VITAMIN D3) 5000 UNITS TABS Take 1 tablet by mouth daily.    [provider]  clonazePAM (KLONOPIN) 0.5 MG tablet Take 1 tablet (0.5 mg total) by mouth 2 (two) times daily. 09/22/16   Orvan Falconer, MD  diphenhydrAMINE (BENADRYL) 25 MG tablet Take 25 mg by mouth at bedtime as needed for allergies.    [provider]  guaiFENesin-dextromethorphan (ROBITUSSIN DM) 100-10 MG/5ML syrup Take 10 mLs by mouth every 4 (four) hours as needed for cough.    [provider]  hydrALAZINE (APRESOLINE) 50 MG tablet Take 1 tablet (50 mg total) by mouth  every 8 (eight) hours. 09/22/16   Orvan Falconer, MD  metoCLOPramide (REGLAN) 10 MG tablet Take 10 mg by mouth as needed for nausea.    [provider]  metoprolol tartrate (LOPRESSOR) 50 MG tablet Take One and a half tablet by mouth twice daily 10/22/16   Reed, Tiffany L, DO  omeprazole (PRILOSEC) 20 MG capsule TAKE 1 CAPSULE (20 MG TOTAL) BY MOUTH 2 (TWO) TIMES DAILY BEFORE A MEAL. 09/10/16   Milus Banister, MD  pravastatin (PRAVACHOL) 40 MG tablet Take 1 tablet (40 mg total) by mouth every evening. 06/21/16   Lauree Chandler, NP  sevelamer carbonate (RENVELA) 800 MG tablet Take 2 tablets (1,600 mg total) by mouth with snacks. 09/22/16   Orvan Falconer, MD  sevelamer carbonate (RENVELA) 800 MG tablet Take 3 tablets (2,400 mg total) by mouth 3 (three) times daily with meals. 09/22/16   Orvan Falconer, MD  traMADol (ULTRAM) 50 MG tablet Take 1 tablet (50 mg total) by mouth daily as needed (severe headache). 10/08/16   Gayland Curry, DO    Family History Family History  Problem Relation Age of Onset  . Adopted: Yes  .  Colon cancer Neg Hx   . Esophageal cancer Neg Hx   . Rectal cancer Neg Hx   . Stomach cancer Neg Hx     Social History Social History  Substance Use Topics  . Smoking status: Current Every Day Smoker    Packs/day: 0.50    Years: 32.00    Types: E-cigarettes    Last attempt to quit: 08/23/2014  . Smokeless tobacco: Never Used  . Alcohol use No     Allergies   Codeine   Review of Systems Review of Systems  Constitutional: Negative for appetite change, chills and fever.  HENT: Negative for sore throat.   Respiratory: Negative for cough and shortness of breath.   Cardiovascular: Negative for chest pain.  Gastrointestinal: Positive for abdominal pain and diarrhea. Negative for anal bleeding, blood in stool, constipation, nausea and vomiting.  Endocrine: Negative for polydipsia and polyuria.  Genitourinary: Positive for difficulty urinating and dysuria. Negative for discharge, flank pain, hematuria, penile pain and testicular pain.  Allergic/Immunologic: Positive for immunocompromised state.     Physical Exam Updated Vital Signs BP (!) 162/71   Pulse 77   Temp 98 F (36.7 C) (Oral)   Resp 16   Ht 5\' 7"  (1.702 m)   Wt 101.6 kg (224 lb)   SpO2 96%   BMI 35.08 kg/m   Physical Exam  Constitutional: He is oriented to person, place, and time. He appears well-developed and well-nourished. No distress.  HENT:  Head: Normocephalic and atraumatic.  Nose: Nose normal.  Mouth/Throat: No oropharyngeal exudate.  Moist mucous membranes  Eyes: Pupils are equal, round, and reactive to light. Conjunctivae and EOM are normal.  Neck: Normal range of motion. Neck supple. No tracheal deviation present.  Cardiovascular: Normal rate, regular rhythm, normal heart sounds and intact distal pulses.   No murmur heard. No lower extremity edema  Pulmonary/Chest: Effort normal and breath sounds normal. No respiratory distress. He has no wheezes. He has no rales.  Abdominal: Soft. Bowel sounds  are normal. He exhibits no distension. There is no tenderness. There is no rebound and no guarding.  No suprapubic or CVA tenderness Abdomen is soft without tenderness, rigidity, rebound or guarding Large right lower quadrant surgical scar  Musculoskeletal: Normal range of motion. He exhibits no deformity.  Lymphadenopathy:    He  has no cervical adenopathy.  Neurological: He is alert and oriented to person, place, and time.  Skin: Skin is warm and dry. Capillary refill takes less than 2 seconds.  Left upper extremity/forearm fistula covered with dressings which are dry and intact  Psychiatric: He has a normal mood and affect. His behavior is normal. Judgment and thought content normal.  Nursing note and vitals reviewed.    ED Treatments / Results  Labs (all labs ordered are listed, but only abnormal results are displayed) Labs Reviewed  LIPASE, BLOOD - Abnormal; Notable for the following:       Result Value   Lipase 89 (*)    All other components within normal limits  COMPREHENSIVE METABOLIC PANEL - Abnormal; Notable for the following:    Chloride 99 (*)    Glucose, Bld 173 (*)    Creatinine, Ser 7.91 (*)    Calcium 8.7 (*)    ALT 15 (*)    GFR calc non Af Amer 7 (*)    GFR calc Af Amer 8 (*)    All other components within normal limits  CBC - Abnormal; Notable for the following:    WBC 3.7 (*)    RBC 4.07 (*)    Hemoglobin 12.4 (*)    Platelets 90 (*)    All other components within normal limits  URINALYSIS, ROUTINE W REFLEX MICROSCOPIC - Abnormal; Notable for the following:    APPearance HAZY (*)    Glucose, UA 50 (*)    Protein, ur >=300 (*)    Leukocytes, UA MODERATE (*)    Bacteria, UA FEW (*)    Squamous Epithelial / LPF 0-5 (*)    All other components within normal limits  GASTROINTESTINAL PANEL BY PCR, STOOL (REPLACES STOOL CULTURE)  OVA + PARASITE EXAM  C DIFFICILE QUICK SCREEN W PCR REFLEX  URINE CULTURE  POC OCCULT BLOOD, ED    EKG  EKG  Interpretation None       Radiology No results found.  Procedures Procedures (including critical care time)  Medications Ordered in ED Medications  cephALEXin (KEFLEX) capsule 500 mg (not administered)  sodium chloride 0.9 % bolus 1,000 mL (1,000 mLs Intravenous New Bag/Given 10/26/16 2102)     Initial Impression / Assessment and Plan / ED Course  I have reviewed the triage vital signs and the nursing notes.  Pertinent labs & imaging results that were available during my care of the patient were reviewed by me and considered in my medical decision making (see chart for details).  Clinical Course as of Oct 27 2210  Fri Oct 26, 2016  2001 Creatinine: (!) 7.91 [CG]  2001 BUN: 20 [CG]  2001 GFR, Est African American: (!) 8 [CG]  2001 WBC, UA: TOO NUMEROUS TO COUNT [CG]  2002 Leukocytes, UA: (!) MODERATE [CG]  2002 Bacteria, UA: (!) FEW [CG]  2002 WBC: (!) 3.7 [CG]  2002 Hemoglobin: (!) 12.4 [CG]  2002 Lipase: (!) 89 [CG]    Clinical Course User Index [CG] Kinnie Feil, PA-C   56 year old male presents with nonbloody diarrhea 4 days, developed nausea and mild, intermittent lower abdominal pain today. No fevers. No melena, hematochezia or pain with BMs. Is tolerating oral fluids/liquids without decrease in appetite. Called his GI doctor Dr. Ardis Hughs who recommended he came to ED for evaluation of possible dehydration.  He had C. Difficile positive diarrhea one year ago. He denies recent antibiotics or sick contacts.  On exam, he is in no acute distress.  Vital signs are WNL. He does not look dehydrated. Abdomen is soft, nontender, without peritoneal signs. No suprapubic or CVA tenderness. He is eating. Negative Hemoccult, no evidence of hemorrhoids.  Lab work today remarkable for moderate leukocytes, TNTC WBC and few bacteria in his urine consistent with urinary tract infection, he does endorse dysuria. Creatinine/BUN and GFR elevated consistent with previous ESRD trends. No  leukocytosis. GI fecal panel, C. Difficile, ova and parasite samples have been sent to facilitate outpatient w/u.   Final Clinical Impressions(s) / ED Diagnoses  Given reassuring exam, lack of fever, no pain currently and no signs of peritonitis don't think further emergent imaging is indicated today.  Lab work and exam is not consistent with ischemic colitis, SBO, pancreatitis or appendicitis. We'll discharge patient after IV fluids with follow up with GI as outpatient. Keflex renally adjusted for UTI. Discussed signs and symptoms that would warrant return to ED for reevaluation. He is agreeable with ED treatment and plan.  Final diagnoses:  Diarrhea in adult patient  Acute cystitis without hematuria    New Prescriptions New Prescriptions   CEPHALEXIN (KEFLEX) 500 MG CAPSULE    Take 1 capsule (500 mg total) by mouth daily.     Kinnie Feil, PA-C 10/26/16 2212    Lajean Saver, MD 10/26/16 [phone]

## 2016-10-26 NOTE — Telephone Encounter (Signed)
The pt will head to the ED now

## 2016-10-26 NOTE — Telephone Encounter (Signed)
Pt began to have Bright red bloody diarrhea for the past 4 days.  Within 15 minutes of eating he has to have a watery BM.  Has not tried imodium.  No fever, extremely nauseous.  Was seen in dialysis today (pulled off 2 liters)  Nothing is staying in when he eats or drinks.  Please advise.

## 2016-10-26 NOTE — Telephone Encounter (Signed)
He should go to ER for evaluation.  May be ischemic colitis; unable to eat or drink now.

## 2016-10-26 NOTE — ED Notes (Signed)
Pt verbalized understanding discharge instructions and denies any further needs or questions at this time. VS stable, ambulatory and steady gait.   

## 2016-10-26 NOTE — Discharge Instructions (Signed)
You presented to ED for evaluation of diarrhea. Samples have been sent. There is not blood in your stool.   I suspect possible infectious etiology, either viral or bacterial. Please follow up with GI doctor as soon as able for further evaluation.   Return to ED for worsening abdominal pain, fevers, dehydration.   Take zofran for nausea as needed. Stay well hydrated.   You were found to have a urinary tract infection, take keflex as prescribed.

## 2016-10-27 LAB — GASTROINTESTINAL PANEL BY PCR, STOOL (REPLACES STOOL CULTURE)
ADENOVIRUS F40/41: NOT DETECTED
ASTROVIRUS: NOT DETECTED
Campylobacter species: NOT DETECTED
Cryptosporidium: NOT DETECTED
Cyclospora cayetanensis: NOT DETECTED
ENTAMOEBA HISTOLYTICA: NOT DETECTED
ENTEROAGGREGATIVE E COLI (EAEC): NOT DETECTED
ENTEROPATHOGENIC E COLI (EPEC): NOT DETECTED
Enterotoxigenic E coli (ETEC): NOT DETECTED
GIARDIA LAMBLIA: NOT DETECTED
NOROVIRUS GI/GII: NOT DETECTED
Plesimonas shigelloides: NOT DETECTED
ROTAVIRUS A: NOT DETECTED
SALMONELLA SPECIES: NOT DETECTED
SHIGELLA/ENTEROINVASIVE E COLI (EIEC): NOT DETECTED
Sapovirus (I, II, IV, and V): NOT DETECTED
Shiga like toxin producing E coli (STEC): NOT DETECTED
VIBRIO CHOLERAE: NOT DETECTED
Vibrio species: NOT DETECTED
Yersinia enterocolitica: NOT DETECTED

## 2016-10-27 LAB — CLOSTRIDIUM DIFFICILE BY PCR: CDIFFPCR: POSITIVE — AB

## 2016-10-28 LAB — URINE CULTURE

## 2016-10-29 ENCOUNTER — Telehealth: Payer: Self-pay | Admitting: Gastroenterology

## 2016-10-29 ENCOUNTER — Other Ambulatory Visit: Payer: Self-pay

## 2016-10-29 DIAGNOSIS — N2581 Secondary hyperparathyroidism of renal origin: Secondary | ICD-10-CM | POA: Diagnosis not present

## 2016-10-29 DIAGNOSIS — E8779 Other fluid overload: Secondary | ICD-10-CM | POA: Diagnosis not present

## 2016-10-29 DIAGNOSIS — N186 End stage renal disease: Secondary | ICD-10-CM | POA: Diagnosis not present

## 2016-10-29 DIAGNOSIS — D509 Iron deficiency anemia, unspecified: Secondary | ICD-10-CM | POA: Diagnosis not present

## 2016-10-29 MED ORDER — VANCOMYCIN HCL 125 MG PO CAPS: 125.0000 mg | ORAL_CAPSULE | Freq: Four times a day (QID) | ORAL | 0 refills | Status: DC

## 2016-10-29 NOTE — Telephone Encounter (Signed)
The pt has been advised and vanc has been sent to the pharmacy.  ROV has also been set up for 11/23/16

## 2016-10-29 NOTE — Telephone Encounter (Signed)
Dr Ardis Hughs please see stool studies result.

## 2016-10-29 NOTE — Telephone Encounter (Signed)
Kirk Banister, MD sent to Jeoffrey Massed, RN Cc: Bjorn Loser, RN        Aleana Fifita,  Please call him in vancomycin 125mg  qid for 2 weeks, also need my next available ROV. He should call in 4-5 days to report on his response. Thanks   Cecille Rubin,  Thanks for forwarding this.   Radonna Ricker

## 2016-10-29 NOTE — Telephone Encounter (Signed)
Got it, just sent you a results note.  Thanks

## 2016-10-31 ENCOUNTER — Inpatient Hospital Stay (HOSPITAL_COMMUNITY)
Admission: EM | Admit: 2016-10-31 | Discharge: 2016-11-01 | DRG: 291 | Disposition: A | Discharge status: Home / Self Care | Payer: Medicare Other | Attending: Family Medicine | Admitting: Family Medicine

## 2016-10-31 ENCOUNTER — Emergency Department (HOSPITAL_COMMUNITY): Payer: Medicare Other

## 2016-10-31 ENCOUNTER — Encounter (HOSPITAL_COMMUNITY): Payer: Self-pay | Admitting: *Deleted

## 2016-10-31 DIAGNOSIS — F17290 Nicotine dependence, other tobacco product, uncomplicated: Secondary | ICD-10-CM | POA: Diagnosis not present

## 2016-10-31 DIAGNOSIS — I132 Hypertensive heart and chronic kidney disease with heart failure and with stage 5 chronic kidney disease, or end stage renal disease: Secondary | ICD-10-CM | POA: Diagnosis not present

## 2016-10-31 DIAGNOSIS — R0602 Shortness of breath: Secondary | ICD-10-CM | POA: Diagnosis not present

## 2016-10-31 DIAGNOSIS — Z940 Kidney transplant status: Secondary | ICD-10-CM | POA: Diagnosis not present

## 2016-10-31 DIAGNOSIS — A0472 Enterocolitis due to Clostridium difficile, not specified as recurrent: Secondary | ICD-10-CM | POA: Diagnosis present

## 2016-10-31 DIAGNOSIS — E662 Morbid (severe) obesity with alveolar hypoventilation: Secondary | ICD-10-CM | POA: Diagnosis not present

## 2016-10-31 DIAGNOSIS — J810 Acute pulmonary edema: Secondary | ICD-10-CM

## 2016-10-31 DIAGNOSIS — G4733 Obstructive sleep apnea (adult) (pediatric): Secondary | ICD-10-CM | POA: Diagnosis not present

## 2016-10-31 DIAGNOSIS — M899 Disorder of bone, unspecified: Secondary | ICD-10-CM | POA: Diagnosis not present

## 2016-10-31 DIAGNOSIS — J9601 Acute respiratory failure with hypoxia: Secondary | ICD-10-CM | POA: Diagnosis present

## 2016-10-31 DIAGNOSIS — Z7982 Long term (current) use of aspirin: Secondary | ICD-10-CM

## 2016-10-31 DIAGNOSIS — I501 Left ventricular failure: Secondary | ICD-10-CM | POA: Diagnosis present

## 2016-10-31 DIAGNOSIS — Z79899 Other long term (current) drug therapy: Secondary | ICD-10-CM | POA: Diagnosis not present

## 2016-10-31 DIAGNOSIS — Z992 Dependence on renal dialysis: Secondary | ICD-10-CM

## 2016-10-31 DIAGNOSIS — E1129 Type 2 diabetes mellitus with other diabetic kidney complication: Secondary | ICD-10-CM | POA: Diagnosis present

## 2016-10-31 DIAGNOSIS — I5033 Acute on chronic diastolic (congestive) heart failure: Secondary | ICD-10-CM | POA: Diagnosis not present

## 2016-10-31 DIAGNOSIS — D631 Anemia in chronic kidney disease: Secondary | ICD-10-CM | POA: Diagnosis present

## 2016-10-31 DIAGNOSIS — K219 Gastro-esophageal reflux disease without esophagitis: Secondary | ICD-10-CM | POA: Diagnosis present

## 2016-10-31 DIAGNOSIS — I10 Essential (primary) hypertension: Secondary | ICD-10-CM | POA: Diagnosis present

## 2016-10-31 DIAGNOSIS — R0600 Dyspnea, unspecified: Secondary | ICD-10-CM | POA: Diagnosis present

## 2016-10-31 DIAGNOSIS — E8770 Fluid overload, unspecified: Secondary | ICD-10-CM | POA: Diagnosis not present

## 2016-10-31 DIAGNOSIS — J811 Chronic pulmonary edema: Secondary | ICD-10-CM | POA: Diagnosis present

## 2016-10-31 DIAGNOSIS — Z885 Allergy status to narcotic agent status: Secondary | ICD-10-CM

## 2016-10-31 DIAGNOSIS — R062 Wheezing: Secondary | ICD-10-CM | POA: Diagnosis not present

## 2016-10-31 DIAGNOSIS — N2581 Secondary hyperparathyroidism of renal origin: Secondary | ICD-10-CM | POA: Diagnosis not present

## 2016-10-31 DIAGNOSIS — Z7951 Long term (current) use of inhaled steroids: Secondary | ICD-10-CM

## 2016-10-31 DIAGNOSIS — F17210 Nicotine dependence, cigarettes, uncomplicated: Secondary | ICD-10-CM | POA: Diagnosis not present

## 2016-10-31 DIAGNOSIS — J449 Chronic obstructive pulmonary disease, unspecified: Secondary | ICD-10-CM | POA: Diagnosis not present

## 2016-10-31 DIAGNOSIS — E1122 Type 2 diabetes mellitus with diabetic chronic kidney disease: Secondary | ICD-10-CM | POA: Diagnosis present

## 2016-10-31 DIAGNOSIS — E8779 Other fluid overload: Secondary | ICD-10-CM | POA: Diagnosis not present

## 2016-10-31 DIAGNOSIS — N186 End stage renal disease: Secondary | ICD-10-CM

## 2016-10-31 DIAGNOSIS — D509 Iron deficiency anemia, unspecified: Secondary | ICD-10-CM | POA: Diagnosis not present

## 2016-10-31 DIAGNOSIS — Z9989 Dependence on other enabling machines and devices: Secondary | ICD-10-CM

## 2016-10-31 DIAGNOSIS — D696 Thrombocytopenia, unspecified: Secondary | ICD-10-CM | POA: Diagnosis not present

## 2016-10-31 DIAGNOSIS — I509 Heart failure, unspecified: Secondary | ICD-10-CM | POA: Diagnosis not present

## 2016-10-31 DIAGNOSIS — Z6834 Body mass index (BMI) 34.0-34.9, adult: Secondary | ICD-10-CM

## 2016-10-31 DIAGNOSIS — R0603 Acute respiratory distress: Secondary | ICD-10-CM | POA: Diagnosis present

## 2016-10-31 DIAGNOSIS — R0682 Tachypnea, not elsewhere classified: Secondary | ICD-10-CM | POA: Diagnosis not present

## 2016-10-31 LAB — CBC WITH DIFFERENTIAL/PLATELET
BASOS PCT: 1 %
Basophils Absolute: 0 10*3/uL (ref 0.0–0.1)
Eosinophils Absolute: 0.2 10*3/uL (ref 0.0–0.7)
Eosinophils Relative: 6 %
HEMATOCRIT: 37.2 % — AB (ref 39.0–52.0)
HEMOGLOBIN: 12 g/dL — AB (ref 13.0–17.0)
LYMPHS ABS: 0.4 10*3/uL — AB (ref 0.7–4.0)
Lymphocytes Relative: 13 %
MCH: 31.7 pg (ref 26.0–34.0)
MCHC: 32.3 g/dL (ref 30.0–36.0)
MCV: 98.2 fL (ref 78.0–100.0)
MONOS PCT: 14 %
Monocytes Absolute: 0.5 10*3/uL (ref 0.1–1.0)
NEUTROS ABS: 2.1 10*3/uL (ref 1.7–7.7)
NEUTROS PCT: 67 %
Platelets: 73 10*3/uL — ABNORMAL LOW (ref 150–400)
RBC: 3.79 MIL/uL — AB (ref 4.22–5.81)
RDW: 14.5 % (ref 11.5–15.5)
WBC: 3.2 10*3/uL — AB (ref 4.0–10.5)

## 2016-10-31 LAB — COMPREHENSIVE METABOLIC PANEL
ALBUMIN: 3.7 g/dL (ref 3.5–5.0)
ALT: 19 U/L (ref 17–63)
ANION GAP: 10 (ref 5–15)
AST: 23 U/L (ref 15–41)
Alkaline Phosphatase: 93 U/L (ref 38–126)
BUN: 29 mg/dL — AB (ref 6–20)
CHLORIDE: 100 mmol/L — AB (ref 101–111)
CO2: 28 mmol/L (ref 22–32)
Calcium: 8.2 mg/dL — ABNORMAL LOW (ref 8.9–10.3)
Creatinine, Ser: 9 mg/dL — ABNORMAL HIGH (ref 0.61–1.24)
GFR calc Af Amer: 7 mL/min — ABNORMAL LOW (ref >60)
GFR calc non Af Amer: 6 mL/min — ABNORMAL LOW (ref >60)
GLUCOSE: 123 mg/dL — AB (ref 65–99)
POTASSIUM: 3.8 mmol/L (ref 3.5–5.1)
SODIUM: 138 mmol/L (ref 135–145)
TOTAL PROTEIN: 6.9 g/dL (ref 6.5–8.1)
Total Bilirubin: 0.7 mg/dL (ref 0.3–1.2)

## 2016-10-31 LAB — GLUCOSE, CAPILLARY
GLUCOSE-CAPILLARY: 161 mg/dL — AB (ref 65–99)
GLUCOSE-CAPILLARY: 152 mg/dL — AB (ref 65–99)
GLUCOSE-CAPILLARY: 251 mg/dL — AB (ref 65–99)

## 2016-10-31 LAB — MRSA PCR SCREENING: MRSA by PCR: NEGATIVE

## 2016-10-31 LAB — TROPONIN I: Troponin I: <0.03 ng/mL (ref <0.03)

## 2016-10-31 MED ORDER — HYDRALAZINE HCL 25 MG PO TABS
50.0000 mg | ORAL_TABLET | Freq: Three times a day (TID) | ORAL | Status: DC
  Administered 2016-10-31 – 2016-11-01 (×2): 50 mg via ORAL
  Filled 2016-10-31 (×2): qty 2

## 2016-10-31 MED ORDER — VITAMIN D 1000 UNITS PO TABS
5000.0000 [IU] | ORAL_TABLET | Freq: Every day | ORAL | Status: DC
  Administered 2016-11-01: 5000 [IU] via ORAL
  Filled 2016-10-31: qty 5

## 2016-10-31 MED ORDER — PENTAFLUOROPROP-TETRAFLUOROETH EX AERO
1.0000 "application " | INHALATION_SPRAY | CUTANEOUS | Status: DC | PRN
  Filled 2016-10-31: qty 30

## 2016-10-31 MED ORDER — INSULIN ASPART 100 UNIT/ML ~~LOC~~ SOLN
0.0000 [IU] | SUBCUTANEOUS | Status: DC
  Administered 2016-10-31: 5 [IU] via SUBCUTANEOUS
  Administered 2016-11-01: 1 [IU] via SUBCUTANEOUS

## 2016-10-31 MED ORDER — LIDOCAINE HCL (PF) 1 % IJ SOLN: 5.0000 mL | INTRAMUSCULAR | Status: DC | PRN

## 2016-10-31 MED ORDER — MOMETASONE FURO-FORMOTEROL FUM 100-5 MCG/ACT IN AERO
2.0000 | INHALATION_SPRAY | Freq: Two times a day (BID) | RESPIRATORY_TRACT | Status: DC
  Administered 2016-10-31 – 2016-11-01 (×2): 2 via RESPIRATORY_TRACT
  Filled 2016-10-31: qty 8.8

## 2016-10-31 MED ORDER — GUAIFENESIN ER 600 MG PO TB12
600.0000 mg | ORAL_TABLET | Freq: Two times a day (BID) | ORAL | Status: DC
  Administered 2016-10-31 – 2016-11-01 (×2): 600 mg via ORAL
  Filled 2016-10-31 (×2): qty 1

## 2016-10-31 MED ORDER — ACETAMINOPHEN 325 MG PO TABS: 650.0000 mg | ORAL_TABLET | Freq: Four times a day (QID) | ORAL | Status: DC | PRN

## 2016-10-31 MED ORDER — VANCOMYCIN 50 MG/ML ORAL SOLUTION
125.0000 mg | Freq: Four times a day (QID) | ORAL | Status: DC
  Administered 2016-10-31 – 2016-11-01 (×4): 125 mg via ORAL
  Filled 2016-10-31 (×9): qty 2.5

## 2016-10-31 MED ORDER — SODIUM CHLORIDE 0.9 % IV SOLN: 100.0000 mL | INTRAVENOUS | Status: DC | PRN

## 2016-10-31 MED ORDER — AMLODIPINE BESYLATE 5 MG PO TABS
10.0000 mg | ORAL_TABLET | Freq: Every day | ORAL | Status: DC
  Administered 2016-11-01: 10 mg via ORAL
  Filled 2016-10-31: qty 2

## 2016-10-31 MED ORDER — ALTEPLASE 2 MG IJ SOLR
2.0000 mg | Freq: Once | INTRAMUSCULAR | Status: DC | PRN
  Filled 2016-10-31: qty 2

## 2016-10-31 MED ORDER — FUROSEMIDE 10 MG/ML IJ SOLN: 40.0000 mg | Freq: Once | INTRAMUSCULAR | Status: DC

## 2016-10-31 MED ORDER — GUAIFENESIN-DM 100-10 MG/5ML PO SYRP: 10.0000 mL | ORAL_SOLUTION | ORAL | Status: DC | PRN

## 2016-10-31 MED ORDER — PRAVASTATIN SODIUM 40 MG PO TABS
40.0000 mg | ORAL_TABLET | Freq: Every evening | ORAL | Status: DC
  Administered 2016-10-31: 40 mg via ORAL
  Filled 2016-10-31: qty 1

## 2016-10-31 MED ORDER — IPRATROPIUM-ALBUTEROL 0.5-2.5 (3) MG/3ML IN SOLN: 3.0000 mL | Freq: Four times a day (QID) | RESPIRATORY_TRACT | Status: DC | PRN

## 2016-10-31 MED ORDER — ACETAMINOPHEN 650 MG RE SUPP: 650.0000 mg | Freq: Four times a day (QID) | RECTAL | Status: DC | PRN

## 2016-10-31 MED ORDER — DIPHENHYDRAMINE HCL 25 MG PO TABS
25.0000 mg | ORAL_TABLET | Freq: Every evening | ORAL | Status: DC | PRN
  Filled 2016-10-31: qty 1

## 2016-10-31 MED ORDER — ASPIRIN EC 81 MG PO TBEC
81.0000 mg | DELAYED_RELEASE_TABLET | Freq: Every day | ORAL | Status: DC
  Administered 2016-11-01: 81 mg via ORAL
  Filled 2016-10-31: qty 1

## 2016-10-31 MED ORDER — METOPROLOL TARTRATE 50 MG PO TABS
75.0000 mg | ORAL_TABLET | Freq: Two times a day (BID) | ORAL | Status: DC
  Administered 2016-10-31 – 2016-11-01 (×2): 75 mg via ORAL
  Filled 2016-10-31 (×2): qty 1

## 2016-10-31 MED ORDER — IPRATROPIUM-ALBUTEROL 0.5-2.5 (3) MG/3ML IN SOLN
3.0000 mL | Freq: Once | RESPIRATORY_TRACT | Status: AC
  Administered 2016-10-31: 3 mL via RESPIRATORY_TRACT
  Filled 2016-10-31: qty 3

## 2016-10-31 MED ORDER — CALCITRIOL 0.25 MCG PO CAPS: 0.2500 ug | ORAL_CAPSULE | ORAL | Status: DC

## 2016-10-31 MED ORDER — SEVELAMER CARBONATE 800 MG PO TABS
2400.0000 mg | ORAL_TABLET | Freq: Three times a day (TID) | ORAL | Status: DC
  Administered 2016-10-31 – 2016-11-01 (×3): 2400 mg via ORAL
  Filled 2016-10-31 (×3): qty 3

## 2016-10-31 MED ORDER — LIDOCAINE-PRILOCAINE 2.5-2.5 % EX CREA
1.0000 "application " | TOPICAL_CREAM | CUTANEOUS | Status: DC | PRN
  Filled 2016-10-31: qty 5

## 2016-10-31 MED ORDER — ONDANSETRON HCL 4 MG/2ML IJ SOLN: 4.0000 mg | Freq: Four times a day (QID) | INTRAMUSCULAR | Status: DC | PRN

## 2016-10-31 MED ORDER — RENA-VITE PO TABS
1.0000 | ORAL_TABLET | Freq: Every day | ORAL | Status: DC
Start: 2016-10-31 — End: 2016-11-01
  Administered 2016-10-31: 1 via ORAL
  Filled 2016-10-31: qty 1

## 2016-10-31 MED ORDER — INSULIN ASPART 100 UNIT/ML ~~LOC~~ SOLN: 0.0000 [IU] | SUBCUTANEOUS | Status: DC

## 2016-10-31 MED ORDER — ONDANSETRON HCL 4 MG PO TABS: 4.0000 mg | ORAL_TABLET | Freq: Four times a day (QID) | ORAL | Status: DC | PRN

## 2016-10-31 MED ORDER — METHYLPREDNISOLONE SODIUM SUCC 125 MG IJ SOLR
125.0000 mg | Freq: Once | INTRAMUSCULAR | Status: AC
  Administered 2016-10-31: 125 mg via INTRAVENOUS
  Filled 2016-10-31: qty 2

## 2016-10-31 MED ORDER — HEPARIN SODIUM (PORCINE) 1000 UNIT/ML DIALYSIS
1000.0000 [IU] | INTRAMUSCULAR | Status: DC | PRN
  Filled 2016-10-31: qty 1

## 2016-10-31 NOTE — Consult Note (Signed)
Kirk [name] MRN: [id number] DOB/AGE: Aug 07, 1960 56 y.o. Primary Care Physician:Reed, Tiffany L, DO Admit date: 10/31/2016 Chief Complaint:  Chief Complaint  Patient presents with  . Shortness of Breath   HPI: Pt is 56 year old male with past medical hx of ESRD who came to ER with c/o shortness of breath.  HPI dates back  1 week ago when pt started feeling bloated and having diarrhea.pt later came to ER and was diagnosed with cdiff.Pt has not taken his po vanco yet as Pharmacy just filled it this am. Pt later started having dyspnea  to this morning  when pt was at HD center when this started. Pt did not complete his tx as EMS was called and sent to ER In ER pt was found to have Pulmonary edema  and was admitted after starting on BIPAP. Pt wife says is diarrhea is a little better. NO c/o abdominal pain NO c/o syncope NO c/o recent travel.  Past Medical History:  Diagnosis Date  . Acute edema of lung, unspecified   . Acute, but ill-defined, cerebrovascular disease   . Allergy   . Anemia   . 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)   . Anxiety   . Asthma   . Carpal tunnel syndrome   . Cellulitis and abscess of trunk   . Cholelithiasis 07/13/2014  . Chronic headaches   . Debility, unspecified   . Dermatophytosis of the body   . Dysrhythmia    history of  . Edema   . End stage renal disease on dialysis [facility])    "MWF; [facility] in [facility]" (10/21/2014)  . Essential hypertension, benign   . GERD (gastroesophageal reflux disease)   . Gout, unspecified   . HTN (hypertension)   . Hypertrophy of prostate without urinary obstruction and other lower urinary tract symptoms (LUTS)   . Hypotension, unspecified   . Impotence of organic origin   . Insomnia, unspecified   . Kidney replaced by transplant   . Localization-related (focal) (partial) epilepsy and epileptic syndromes with complex partial seizures, without mention of intractable epilepsy   . Lumbago   . Memory loss    . OSA on CPAP   . Other and unspecified hyperlipidemia   . Other chronic nonalcoholic liver disease   . Other malaise and fatigue   . Other nonspecific abnormal serum enzyme levels   . Pain in joint, lower leg   . Pain in joint, upper arm   . Pneumonia "several times"  . Renal dialysis status(V45.11) 02/05/2010   restarted 01/02/13 ofter renal trransplant failure  . Secondary hyperparathyroidism (of renal origin)   . Shortness of breath   . Sleep apnea   . Tension headache   . Unspecified constipation   . Unspecified essential hypertension   . Unspecified hereditary and idiopathic peripheral neuropathy   . Unspecified vitamin D deficiency        Family History  Problem Relation Age of Onset  . Adopted: Yes  . Colon cancer Neg Hx   . Esophageal cancer Neg Hx   . Rectal cancer Neg Hx   . Stomach cancer Neg Hx     Social History:  reports that he has been smoking E-cigarettes.  He has a 16.00 pack-year smoking history. He has never used smokeless tobacco. He reports that he does not drink alcohol or use drugs.   Allergies:  Allergies  Allergen Reactions  . Codeine Nausea And Vomiting and Nausea Only    Altered mental status     (  Not in a hospital admission)     YKD:XIPJA from the symptoms mentioned above,there are no other symptoms referable to all systems reviewed.  . furosemide  40 mg Intravenous Once        Physical Exam: Vital signs in last 24 hours: Pulse Rate:  [77-81] 80 (10/10 1018) Resp:  [15-22] 18 (10/10 1018) BP: (161-176)/(77-83) 176/83 (10/10 1018) SpO2:  [92 %-100 %] 92 % (10/10 1018) FiO2 (%):  [35 %] 35 % (10/10 0941) Weight:  [224 lb (101.6 kg)] 224 lb (101.6 kg) (10/10 0843) Weight change:     Intake/Output from previous day: No intake/output data recorded. No intake/output data recorded.   Physical Exam: General- pt is awake,alert, oriented to time place and person HEENT- Bipap in situ  Resp- no REsp distress,Rhonchi+ CVS-  S1S2 regular in rate and rhythm GIT- BS+, soft, NT, ND, surgical scar for transplant present EXT- NO LE Edema, NOCyanosis CNS- CN 2-12 grossly intact. Moving all 4 extremities Psych- normal mood and affect Access-AVF+    Lab Results: CBC  Recent Labs  10/31/16 0846  WBC 3.2*  HGB 12.0*  HCT 37.2*  PLT 73*    BMET  Recent Labs  10/31/16 0846  NA 138  K 3.8  CL 100*  CO2 28  GLUCOSE 123*  BUN 29*  CREATININE 9.00*  CALCIUM 8.2*    MICRO Recent Results (from the past 240 hour(s))  Urine culture     Status: Abnormal   Collection Time: 10/26/16  5:00 PM  Result Value Ref Range Status   Specimen Description URINE, RANDOM  Final   Special Requests NONE  Final   Culture MULTIPLE SPECIES PRESENT, SUGGEST RECOLLECTION (A)  Final   Report Status 10/28/2016 FINAL  Final  Gastrointestinal Panel by PCR , Stool     Status: None   Collection Time: 10/26/16  8:21 PM  Result Value Ref Range Status   Campylobacter species NOT DETECTED NOT DETECTED Final   Plesimonas shigelloides NOT DETECTED NOT DETECTED Final   Salmonella species NOT DETECTED NOT DETECTED Final   Yersinia enterocolitica NOT DETECTED NOT DETECTED Final   Vibrio species NOT DETECTED NOT DETECTED Final   Vibrio cholerae NOT DETECTED NOT DETECTED Final   Enteroaggregative E coli (EAEC) NOT DETECTED NOT DETECTED Final   Enteropathogenic E coli (EPEC) NOT DETECTED NOT DETECTED Final   Enterotoxigenic E coli (ETEC) NOT DETECTED NOT DETECTED Final   Shiga like toxin producing E coli (STEC) NOT DETECTED NOT DETECTED Final   Shigella/Enteroinvasive E coli (EIEC) NOT DETECTED NOT DETECTED Final   Cryptosporidium NOT DETECTED NOT DETECTED Final   Cyclospora cayetanensis NOT DETECTED NOT DETECTED Final   Entamoeba histolytica NOT DETECTED NOT DETECTED Final   Giardia lamblia NOT DETECTED NOT DETECTED Final   Adenovirus F40/41 NOT DETECTED NOT DETECTED Final   Astrovirus NOT DETECTED NOT DETECTED Final   Norovirus  GI/GII NOT DETECTED NOT DETECTED Final   Rotavirus A NOT DETECTED NOT DETECTED Final   Sapovirus (I, II, IV, and V) NOT DETECTED NOT DETECTED Final  C difficile quick scan w PCR reflex     Status: Abnormal   Collection Time: 10/26/16  8:21 PM  Result Value Ref Range Status   C Diff antigen POSITIVE (A) NEGATIVE Final   C Diff toxin NEGATIVE NEGATIVE Final   C Diff interpretation Results are indeterminate. See PCR results.  Final  Clostridium Difficile by PCR     Status: Abnormal   Collection Time: 10/26/16  8:21 PM  Result Value Ref Range Status   Toxigenic C Difficile by pcr POSITIVE (A) NEGATIVE Final    Comment: Positive for toxigenic C. difficile with little to no toxin production. Only treat if clinical presentation suggests symptomatic illness.      Lab Results  Component Value Date   PTH 429 04/30/2015   CALCIUM 8.2 (L) 10/31/2016   CAION 1.02 (L) 09/08/2013   PHOS 8.2 (H) 09/21/2016    Impression: 1)Renal  ESRD on Hd               Pt is On Monday/Wednesday/Friday schedule               will dialyze today               Hx of failed transplant  2)HTN  Medication- On Calcium Channel Blockers On Alpha and beta Blockers   3)Anemia In ESRD the goal for hgb ia 9--11    Pt hgb is at goal   4)CKD Mineral-Bone Disorder Secondary Hyperparathyroidism present  Phosphorus not at goal.   5)Resp  admitted with pulmonary edema Primary MD following  6)Electrolytes Normokalemic NOrmonatremic   7)Acid base Co2 at goal   8) ID- C diff  on PO vanco Primary team following   9) Thrombocytopenia   ? Etiology -infection-C diff    Pt does have hx of low platelets counts    Plan:  Will dialyze today Will use 3  [place] NO need of epo Will not start binders as pt now had diarrhea Will recheck phos      Sheyann Sulton S 10/31/2016, 10:50 AM

## 2016-10-31 NOTE — ED Triage Notes (Signed)
Pt comes in by EMS from dialysis for shortness of breath. EMS states they had just started his dialysis. They placed him on oxygen there until EMS got there. EMS got a reading of 92% on 4L. Pt continued to have shortness of breath and was placed on the cpap. Pt is alert now. RT at bedside

## 2016-10-31 NOTE — H&P (Signed)
TRH H&P   Patient Demographics:    Kirk [name], is a 56 y.o. male  MRN: [id number]   DOB - January 23, 1960  Admit Date - 10/31/2016  Outpatient Primary MD for the patient is Gayland Curry, DO  Referring MD: Dr. Lacinda Axon  Outpatient Specialists:  Dr. Florene Glen (nephrology) Dr. Ardis Hughs (GI)    Patient coming from: Outpatient hemodialysis center  Chief Complaint  Patient presents with  . Shortness of Breath      HPI:    Kirk [name]  is a 56 y.o. male, With history of OSA/OHS, diabetes mellitus, ESRD on hemodialysis (M, W, F), chronic diastolic CHF, uncontrolled hypertension who was hospitalized about 5 weeks back with acute pulmonary edema in the setting of fluid overload and possible pneumonia who presented to the ED on 10/5 with several episodes of diarrhea for a few days and was found to have UTI and discharged on Keflex. His stool was positive for C. difficile and his gastroenterologist prescribed him 2 weeks of oral vancomycin which he filled only yesterday. Reported his diarrhea to reperfuse and almost 20 episodes initially (some mixed with blood) and about 5-6 episodes for the past 2 days. Also having some abdominal distention. Possibly due to being dehydrated with diarrhea wife reports that they have not been putting out much fluid during last 2 dialysis sessions. History provided by ED physician and wife at bedside. Wife informs that patient has been coughing for the last 2 days. Patient was sent from dialysis today with acute shortness of breath during the session and required 4 L oxygen nasal cannula. In the ED he was found to be in acute respiratory distress and placed on BiPAP.  No history of fevers, chills, headache, blurred vision, dizziness, chest pain, palpitations, abdominal pain, dysuria, joint pain or muscle aches. No recent travel or sick contacts.   Review of  systems:    In addition to the HPI above,  No Fever-chills, No Headache, No changes with Vision or hearing, No problems swallowing food or Liquids, No Chest pain, Cough and shortness of breath+++ Abdominal distention +, watery diarrhea, no abdominal pain, nausea or vomiting No Blood in stool or Urine, No dysuria, No new skin rashes or bruises, No new joints pains-aches,  No new weakness, tingling, numbness in any extremity, No recent weight gain or loss, No polyuria, polydypsia or polyphagia, No significant Mental Stressors.  A full 10 point Review of Systems was done, except as stated above, all other Review of Systems were negative.   With Past History of the following :    Past Medical History:  Diagnosis Date  . Acute edema of lung, unspecified   . Acute, but ill-defined, cerebrovascular disease   . Allergy   . Anemia   . 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)   . Anxiety   . Asthma   . Carpal tunnel syndrome   .  Cellulitis and abscess of trunk   . Cholelithiasis 07/13/2014  . Chronic headaches   . Debility, unspecified   . Dermatophytosis of the body   . Dysrhythmia    history of  . Edema   . End stage renal disease on dialysis [facility])    "MWF; [facility]" (10/21/2014)  . Essential hypertension, benign   . GERD (gastroesophageal reflux disease)   . Gout, unspecified   . HTN (hypertension)   . Hypertrophy of prostate without urinary obstruction and other lower urinary tract symptoms (LUTS)   . Hypotension, unspecified   . Impotence of organic origin   . Insomnia, unspecified   . Kidney replaced by transplant   . Localization-related (focal) (partial) epilepsy and epileptic syndromes with complex partial seizures, without mention of intractable epilepsy   . Lumbago   . Memory loss   . OSA on CPAP   . Other and unspecified hyperlipidemia   . Other chronic nonalcoholic liver disease   . Other malaise and fatigue   . Other nonspecific  abnormal serum enzyme levels   . Pain in joint, lower leg   . Pain in joint, upper arm   . Pneumonia "several times"  . Renal dialysis status(V45.11) 02/05/2010   restarted 01/02/13 ofter renal trransplant failure  . Secondary hyperparathyroidism (of renal origin)   . Shortness of breath   . Sleep apnea   . Tension headache   . Unspecified constipation   . Unspecified essential hypertension   . Unspecified hereditary and idiopathic peripheral neuropathy   . Unspecified vitamin D deficiency       Past Surgical History:  Procedure Laterality Date  . AV FISTULA PLACEMENT Left ?2010   "forearm; at [facility]"  . BACK SURGERY    . CARDIAC CATHETERIZATION  03/21/2011  . CHOLECYSTECTOMY N/A 10/21/2014   Procedure: LAPAROSCOPIC CHOLECYSTECTOMY WITH INTRAOPERATIVE CHOLANGIOGRAM;  Surgeon: Autumn Messing III, MD;  Location: [place];  Service: General;  Laterality: N/A;  . COLONOSCOPY    . 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 KIDNEY TRANSPLANT  08/17/2011   [place]  10/21/2014   w/IOC  . LEFT HEART CATHETERIZATION WITH CORONARY ANGIOGRAM N/A 03/21/2011   Procedure: LEFT HEART CATHETERIZATION WITH CORONARY ANGIOGRAM;  Surgeon: [name], MD;  Location: [facility] CATH LAB;  Service: Cardiovascular;  Laterality: N/A;  . NEPHRECTOMY  08/2013   removed transplaned kidney  . POSTERIOR FUSION CERVICAL SPINE  06/25/2012   for spinal stenosis  . VASECTOMY  2010      Social History:     Social History  Substance Use Topics  . Smoking status: Current Every Day Smoker    Packs/day: 0.50    Years: 32.00    Types: E-cigarettes    Last attempt to quit: 08/23/2014  . Smokeless tobacco: Never Used  . Alcohol use No     Lives - Home with wife  Mobility - independent     Family History :     Family History  Problem Relation Age of Onset  . Adopted: Yes  . Colon cancer Neg Hx   . Esophageal cancer Neg Hx   . Rectal cancer Neg Hx    . Stomach cancer Neg Hx       Home Medications:   Prior to Admission medications   Medication Sig Start Date End Date Taking? Authorizing Provider  acetaminophen (TYLENOL) 500 MG tablet Take 1 tablet (500 mg total) by  mouth every 6 (six) hours as needed for moderate pain. 09/12/14  Yes Samuella Cota, MD  amLODipine (NORVASC) 10 MG tablet One twice daily to control BP 06/21/16  Yes Lauree Chandler, NP  aspirin EC 81 MG tablet Take 81 mg by mouth daily.   Yes [provider]  b complex-vitamin c-folic acid (NEPHRO-VITE) 0.8 MG TABS tablet TAKE 1 TABLET BY MOUTH EVERY DAY 03/29/16  Yes Estill Dooms, MD  budesonide-formoterol [facility]) 80-4.5 MCG/ACT inhaler Inhale 2 puffs into the lungs 2 (two) times daily. 09/21/14  Yes Bonnielee Haff, MD  calcitRIOL (ROCALTROL) 0.25 MCG capsule Take 1 capsule (0.25 mcg total) by mouth every Monday, Wednesday, and Friday with hemodialysis. 09/21/14  Yes Bonnielee Haff, MD  cephALEXin (KEFLEX) 500 MG capsule Take 1 capsule (500 mg total) by mouth daily. 10/26/16 10/31/16 Yes Kinnie Feil, PA-C  Cholecalciferol (VITAMIN D3) 5000 UNITS TABS Take 1 tablet by mouth daily.   Yes [provider]  clonazePAM (KLONOPIN) 0.5 MG tablet Take 1 tablet (0.5 mg total) by mouth 2 (two) times daily. 09/22/16  Yes Orvan Falconer, MD  diphenhydrAMINE (BENADRYL) 25 MG tablet Take 25 mg by mouth at bedtime as needed for allergies.   Yes [provider]  guaiFENesin (MUCINEX) 600 MG 12 hr tablet Take 600 mg by mouth 2 (two) times daily.   Yes [provider]  guaiFENesin-dextromethorphan (ROBITUSSIN DM) 100-10 MG/5ML syrup Take 10 mLs by mouth every 4 (four) hours as needed for cough.   Yes [provider]  hydrALAZINE (APRESOLINE) 50 MG tablet Take 1 tablet (50 mg total) by mouth every 8 (eight) hours. 09/22/16  Yes Orvan Falconer, MD  metoCLOPramide (REGLAN) 10 MG tablet Take 10 mg by mouth as needed for nausea.   Yes [provider]  metoprolol tartrate (LOPRESSOR) 50 MG tablet Take One and a half tablet by mouth twice daily 10/22/16  Yes Reed, Tiffany L, DO  omeprazole (PRILOSEC) 20 MG capsule TAKE 1 CAPSULE (20 MG TOTAL) BY MOUTH 2 (TWO) TIMES DAILY BEFORE A MEAL. 09/10/16  Yes Milus Banister, MD  ondansetron (ZOFRAN ODT) 4 MG disintegrating tablet Take 1 tablet (4 mg total) by mouth every 8 (eight) hours as needed for nausea or vomiting. 10/26/16  Yes Kinnie Feil, PA-C  pravastatin (PRAVACHOL) 40 MG tablet Take 1 tablet (40 mg total) by mouth every evening. 06/21/16  Yes Lauree Chandler, NP  sevelamer carbonate (RENVELA) 800 MG tablet Take 2 tablets (1,600 mg total) by mouth with snacks. 09/22/16  Yes Orvan Falconer, MD  sevelamer carbonate (RENVELA) 800 MG tablet Take 3 tablets (2,400 mg total) by mouth 3 (three) times daily with meals. 09/22/16  Yes Orvan Falconer, MD  traMADol (ULTRAM) 50 MG tablet Take 1 tablet (50 mg total) by mouth daily as needed (severe headache). 10/08/16  Yes Reed, Tiffany L, DO  vancomycin (VANCOCIN) 125 MG capsule Take 1 capsule (125 mg total) by mouth 4 (four) times daily. 10/29/16 11/12/16  Milus Banister, MD     Allergies:     Allergies  Allergen Reactions  . Codeine Nausea And Vomiting and Nausea Only    Altered mental status     Physical Exam:   Vitals  Blood pressure (!) 176/83, pulse 80, resp. rate 18, height 5\' 7"  (1.702 m), weight 101.6 kg (224 lb), SpO2 92 %.   Gen.: Middle aged obese male lying in bed on BiPAP. HEENT: No pallor, on BiPAP, JVD present, Chest: Diffuse  rhonchi bilaterally, diminished bibasilar breath sounds, no crackles or wheezes. CVS: Normal S1 and S2, no murmurs rub or gallop  Soft, mild distention, nontender, bowel sounds present Musculoskeletal: Warm, no edema, left upper extremity AV fistula. CNS: Alert and awake.   Data Review:    CBC  Recent Labs Lab 10/26/16 1705 10/31/16 0846  WBC 3.7* 3.2*  HGB 12.4* 12.0*  HCT 39.3 37.2*    PLT 90* 73*  MCV 96.6 98.2  MCH 30.5 31.7  MCHC 31.6 32.3  RDW 14.7 14.5  LYMPHSABS  --  0.4*  MONOABS  --  0.5  EOSABS  --  0.2  BASOSABS  --  0.0   ------------------------------------------------------------------------------------------------------------------  Chemistries   Recent Labs Lab 10/26/16 1705 10/31/16 0846  NA 138 138  K 3.5 3.8  CL 99* 100*  CO2 25 28  GLUCOSE 173* 123*  BUN 20 29*  CREATININE 7.91* 9.00*  CALCIUM 8.7* 8.2*  AST 26 23  ALT 15* 19  ALKPHOS 78 93  BILITOT 0.4 0.7   ------------------------------------------------------------------------------------------------------------------ estimated creatinine clearance is 10.7 mL/min (A) (by C-G formula based on SCr of 9 mg/dL (H)). ------------------------------------------------------------------------------------------------------------------ No results for input(s): TSH, T4TOTAL, T3FREE, THYROIDAB in the last 72 hours.  Invalid input(s): FREET3  Coagulation profile No results for input(s): INR, PROTIME in the last 168 hours. ------------------------------------------------------------------------------------------------------------------- No results for input(s): DDIMER in the last 72 hours. -------------------------------------------------------------------------------------------------------------------  Cardiac Enzymes  Recent Labs Lab 10/31/16 0846  TROPONINI <0.03   ------------------------------------------------------------------------------------------------------------------    Component Value Date/Time   BNP 517.0 (H) 09/12/2016 2338     ---------------------------------------------------------------------------------------------------------------  Urinalysis    Component Value Date/Time   COLORURINE YELLOW 10/26/2016 1700   APPEARANCEUR HAZY (A) 10/26/2016 1700   LABSPEC 1.012 10/26/2016 1700   PHURINE 6.0 10/26/2016 1700   GLUCOSEU 50 (A) 10/26/2016 1700    HGBUR NEGATIVE 10/26/2016 1700   BILIRUBINUR NEGATIVE 10/26/2016 1700   KETONESUR NEGATIVE 10/26/2016 1700   PROTEINUR >=300 (A) 10/26/2016 1700   UROBILINOGEN 0.2 04/06/2014 1030   NITRITE NEGATIVE 10/26/2016 1700   LEUKOCYTESUR MODERATE (A) 10/26/2016 1700    ----------------------------------------------------------------------------------------------------------------   Imaging Results:    Dg Chest Portable 1 View  Result Date: 10/31/2016 CLINICAL DATA:  Shortness of breath history of renal transplantation with subsequent removal of the transplanted kidney, dialysis dependent renal failure, diabetes, asthma, current smoker. EXAM: PORTABLE CHEST 1 VIEW COMPARISON:  Chest x-ray of September 18, 2016 FINDINGS: The lungs are well-expanded. The interstitial markings are increased and slightly more conspicuous today. The cardiac silhouette is mildly enlarged. The pulmonary vascularity is engorged and less distinct today. There is calcification in the wall of the aortic arch. The trachea is midline. There is no pleural effusion. The bony thorax exhibits no acute abnormality. IMPRESSION: Acute on chronic CHF. Underlying COPD -reactive airway disease. No acute pneumonia. Thoracic aortic atherosclerosis. Electronically Signed   By: David  Martinique M.D.   On: 10/31/2016 09:03    My personal review of EKG: Pending   Assessment & Plan:    Principal Problem:   Acute respiratory failure with hypoxemia ([place]) Secondary to acute pulmonary edema likely due to acute diastolic CHF. As per wife patient has not been getting much fluid to rule out during dialysis in the last couple of sessions. (Suspect patient was already volume depleted with ongoing diarrhea). Continue BiPAP. Nephrology consulted and will be arranged for hemodialysis today. Monitor respiratory status post dialysis. Monitor strict I/O (patient does make some urine).  Active Problems:  Clostridium difficile enteritis Patient presented to the  ED on 10/5 with diarrhea mixed with blood since 10/1 (>20 episodes a day). He had C. difficile PCR positive and was prescribed oral vancomycin for 2 weeks course on 10/8 which he filled only yesterday and has taken only one dose so far. Will place him on oral vancomycin 125 mg 4 times a day for 2 weeks course. Enteric precautions.  Patient was treated with antibiotics for presumed pneumonia when he was hospitalized 5 weeks back. He was also prescribed Keflex for UTI when he presented to the ED on 10/5. I would hold off on any antibiotics.    OSA/ OHS on CPAP Continue nighttime CPAP once patient off BiPAP.  Uncontrolled hypertension Secondary to acute respiratory symptoms and patient has not taken his blood pressure medications today. Resume home blood pressure medications and monitor after dialysis.    DM (diabetes mellitus), type 2 with renal complications (HCC) Not on any home medications. Monitor on sliding scale coverage.    Thrombocytopenia (HCC) Worsened platelets likely due to acute infection. Monitor for now.    ESRD on dialysis (M, W, F) [facility]) Nephrology on board. Plan on dialysis today. Continue calcium supplement and renvela.  Acute on chronic diastolic CHF Continue aspirin, beta blocker and statin. Volume management with dialysis.  COPD Continue home inhalers.  GERD Will order PPI in the setting of active C. difficile.  DVT Prophylaxis : SCDs  AM Labs Ordered, also please review Full Orders  Family Communication: Admission, patients condition and plan of care including tests being ordered have been discussed with the patient and his wife at bedside  Code Status full code  Likely DC to  home  Condition GUARDED  Consults called: Renal  Admission status: Inpatient  Time spent in minutes : 70   Louellen Molder M.D on 10/31/2016 at 11:09 AM  Between 7am to 7pm - Pager - [phone]. After 7pm go to [email or web address] - password [facility]  [facility] - Office   [phone]

## 2016-10-31 NOTE — ED Provider Notes (Signed)
[facility] DEPT Provider Note   CSN: [id number] Arrival date & time: 10/31/16  0254     History   Chief Complaint Chief Complaint  Patient presents with  . Shortness of Breath    HPI Kirk [name] is a 56 y.o. male.  Level V caveat for urgent need for intervention. Patient presents with dyspnea during his dialysis session today. He was transported from the dialysis center to the ED by EMS. His initial pulse ox reading was 92% on 4 L of oxygen. He was put on BiPAP. No chest pain noted. No prodromal illnesses.      Past Medical History:  Diagnosis Date  . Acute edema of lung, unspecified   . Acute, but ill-defined, cerebrovascular disease   . Allergy   . Anemia   . 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)   . Anxiety   . Asthma   . Carpal tunnel syndrome   . Cellulitis and abscess of trunk   . Cholelithiasis 07/13/2014  . Chronic headaches   . Debility, unspecified   . Dermatophytosis of the body   . Dysrhythmia    history of  . Edema   . End stage renal disease on dialysis [facility])    "MWF; [facility]" (10/21/2014)  . Essential hypertension, benign   . GERD (gastroesophageal reflux disease)   . Gout, unspecified   . HTN (hypertension)   . Hypertrophy of prostate without urinary obstruction and other lower urinary tract symptoms (LUTS)   . Hypotension, unspecified   . Impotence of organic origin   . Insomnia, unspecified   . Kidney replaced by transplant   . Localization-related (focal) (partial) epilepsy and epileptic syndromes with complex partial seizures, without mention of intractable epilepsy   . Lumbago   . Memory loss   . OSA on CPAP   . Other and unspecified hyperlipidemia   . Other chronic nonalcoholic liver disease   . Other malaise and fatigue   . Other nonspecific abnormal serum enzyme levels   . Pain in joint, lower leg   . Pain in joint, upper arm   . Pneumonia "several times"  . Renal dialysis status(V45.11) 02/05/2010    restarted 01/02/13 ofter renal trransplant failure  . Secondary hyperparathyroidism (of renal origin)   . Shortness of breath   . Sleep apnea   . Tension headache   . Unspecified constipation   . Unspecified essential hypertension   . Unspecified hereditary and idiopathic peripheral neuropathy   . Unspecified vitamin D deficiency     Patient Active Problem List   Diagnosis Date Noted  . Pulmonary edema 09/13/2016  . Shunt malfunction 07/03/2016  . Nausea without vomiting 12/14/2015  . End stage renal disease ([place])   . Palpitations 06/29/2015  . Need for hepatitis C screening test 06/29/2015  . Fatigue 06/29/2015  . Myalgia 05/17/2015  . 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 Obesity hypoventilation syndrome ([place]) 12/09/2014  . Extrinsic asthma 12/09/2014  . Narcotic drug use 12/09/2014  . Hypersomnia with sleep apnea 12/09/2014  . SCC (squamous cell carcinoma), arm 11/09/2014  . Mild persistent chronic asthma without complication 27/06/2374  . ESRD on dialysis ([place]) 09/09/2014  . HLD (hyperlipidemia) 09/09/2014  . Dyspnea 06/23/2014  . Pancytopenia ([place]) 04/28/2014  . Thrombocytopenia ([place]) 04/10/2014  . Fungal dermatitis 03/17/2014  . History of surgical procedure 09/17/2013  . Headache(784.0) 04/28/2013  . Awaiting organ transplant 02/13/2013  . HTN (hypertension)   . DM (diabetes mellitus), type 2 with renal  complications ([place])   . Tension headache   . Memory loss   . Anemia in chronic renal disease   . Edema   . Pain in joint, lower leg 05/08/2012  . Thoracic or lumbosacral neuritis or radiculitis 03/27/2012  . Nerve root pain 03/27/2012  . History of kidney transplant 09/10/2011  . Hearing loss 08/16/2011  . Obesity 08/16/2011  . Difficulty hearing 08/16/2011  . OSA on CPAP 08/05/2011    Past Surgical History:  Procedure Laterality Date  . AV FISTULA PLACEMENT Left ?2010   "forearm; at [facility]"  . BACK SURGERY    . CARDIAC  CATHETERIZATION  03/21/2011  . CHOLECYSTECTOMY N/A 10/21/2014   Procedure: LAPAROSCOPIC CHOLECYSTECTOMY WITH INTRAOPERATIVE CHOLANGIOGRAM;  Surgeon: Autumn Messing III, MD;  Location: [facility];  Service: General;  Laterality: N/A;  . COLONOSCOPY    . 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 KIDNEY TRANSPLANT  08/17/2011   [place]  10/21/2014   w/IOC  . LEFT HEART CATHETERIZATION WITH CORONARY ANGIOGRAM N/A 03/21/2011   Procedure: LEFT HEART CATHETERIZATION WITH CORONARY ANGIOGRAM;  Surgeon: [name], MD;  Location: [facility] CATH LAB;  Service: Cardiovascular;  Laterality: N/A;  . NEPHRECTOMY  08/2013   removed transplaned kidney  . POSTERIOR FUSION CERVICAL SPINE  06/25/2012   for spinal stenosis  . VASECTOMY  2010       Home Medications    Prior to Admission medications   Medication Sig Start Date End Date Taking? Authorizing Provider  acetaminophen (TYLENOL) 500 MG tablet Take 1 tablet (500 mg total) by mouth every 6 (six) hours as needed for moderate pain. 09/12/14  Yes Samuella Cota, MD  amLODipine (NORVASC) 10 MG tablet One twice daily to control BP 06/21/16  Yes Lauree Chandler, NP  aspirin EC 81 MG tablet Take 81 mg by mouth daily.   Yes [provider]  b complex-vitamin c-folic acid (NEPHRO-VITE) 0.8 MG TABS tablet TAKE 1 TABLET BY MOUTH EVERY DAY 03/29/16  Yes Estill Dooms, MD  budesonide-formoterol [facility]) 80-4.5 MCG/ACT inhaler Inhale 2 puffs into the lungs 2 (two) times daily. 09/21/14  Yes Bonnielee Haff, MD  calcitRIOL (ROCALTROL) 0.25 MCG capsule Take 1 capsule (0.25 mcg total) by mouth every Monday, Wednesday, and Friday with hemodialysis. 09/21/14  Yes Bonnielee Haff, MD  cephALEXin (KEFLEX) 500 MG capsule Take 1 capsule (500 mg total) by mouth daily. 10/26/16 10/31/16 Yes Kinnie Feil, PA-C  Cholecalciferol (VITAMIN D3) 5000 UNITS TABS Take 1 tablet by mouth daily.   Yes [provider]    clonazePAM (KLONOPIN) 0.5 MG tablet Take 1 tablet (0.5 mg total) by mouth 2 (two) times daily. 09/22/16  Yes Orvan Falconer, MD  diphenhydrAMINE (BENADRYL) 25 MG tablet Take 25 mg by mouth at bedtime as needed for allergies.   Yes [provider]  guaiFENesin (MUCINEX) 600 MG 12 hr tablet Take 600 mg by mouth 2 (two) times daily.   Yes [provider]  guaiFENesin-dextromethorphan (ROBITUSSIN DM) 100-10 MG/5ML syrup Take 10 mLs by mouth every 4 (four) hours as needed for cough.   Yes [provider]  hydrALAZINE (APRESOLINE) 50 MG tablet Take 1 tablet (50 mg total) by mouth every 8 (eight) hours. 09/22/16  Yes Orvan Falconer, MD  metoCLOPramide (REGLAN) 10 MG tablet Take 10 mg by mouth as needed for nausea.   Yes [provider]  metoprolol tartrate (LOPRESSOR) 50 MG tablet Take One  and a half tablet by mouth twice daily 10/22/16  Yes Reed, Tiffany L, DO  omeprazole (PRILOSEC) 20 MG capsule TAKE 1 CAPSULE (20 MG TOTAL) BY MOUTH 2 (TWO) TIMES DAILY BEFORE A MEAL. 09/10/16  Yes Milus Banister, MD  ondansetron (ZOFRAN ODT) 4 MG disintegrating tablet Take 1 tablet (4 mg total) by mouth every 8 (eight) hours as needed for nausea or vomiting. 10/26/16  Yes Kinnie Feil, PA-C  pravastatin (PRAVACHOL) 40 MG tablet Take 1 tablet (40 mg total) by mouth every evening. 06/21/16  Yes Lauree Chandler, NP  sevelamer carbonate (RENVELA) 800 MG tablet Take 2 tablets (1,600 mg total) by mouth with snacks. 09/22/16  Yes Orvan Falconer, MD  sevelamer carbonate (RENVELA) 800 MG tablet Take 3 tablets (2,400 mg total) by mouth 3 (three) times daily with meals. 09/22/16  Yes Orvan Falconer, MD  traMADol (ULTRAM) 50 MG tablet Take 1 tablet (50 mg total) by mouth daily as needed (severe headache). 10/08/16  Yes Reed, Tiffany L, DO  vancomycin (VANCOCIN) 125 MG capsule Take 1 capsule (125 mg total) by mouth 4 (four) times daily. 10/29/16 11/12/16  Milus Banister, MD    Family History Family History  Problem  Relation Age of Onset  . Adopted: Yes  . Colon cancer Neg Hx   . Esophageal cancer Neg Hx   . Rectal cancer Neg Hx   . Stomach cancer Neg Hx     Social History Social History  Substance Use Topics  . Smoking status: Current Every Day Smoker    Packs/day: 0.50    Years: 32.00    Types: E-cigarettes    Last attempt to quit: 08/23/2014  . Smokeless tobacco: Never Used  . Alcohol use No     Allergies   Codeine   Review of Systems Review of Systems  Unable to perform ROS: Acuity of condition     Physical Exam Updated Vital Signs BP (!) 176/83   Pulse 80   Resp 18   Ht 5\' 7"  (1.702 m)   Wt 101.6 kg (224 lb)   SpO2 92%   BMI 35.08 kg/m   Physical Exam  Constitutional: He is oriented to person, place, and time.  Currently on BiPAP  HENT:  Head: Normocephalic and atraumatic.  Eyes: Conjunctivae are normal.  Neck: Neck supple.  Cardiovascular: Normal rate and regular rhythm.   Pulmonary/Chest:  Patient on BiPAP. Normal respiratory rate.  Abdominal: Soft. Bowel sounds are normal.  Musculoskeletal: Normal range of motion.  Neurological: He is alert and oriented to person, place, and time.  Skin: Skin is warm and dry.  Psychiatric: He has a normal mood and affect. His behavior is normal.  Nursing note and vitals reviewed.    ED Treatments / Results  Labs (all labs ordered are listed, but only abnormal results are displayed) Labs Reviewed  CBC WITH DIFFERENTIAL/PLATELET - Abnormal; Notable for the following:       Result Value   WBC 3.2 (*)    RBC 3.79 (*)    Hemoglobin 12.0 (*)    HCT 37.2 (*)    Platelets 73 (*)    Lymphs Abs 0.4 (*)    All other components within normal limits  COMPREHENSIVE METABOLIC PANEL - Abnormal; Notable for the following:    Chloride 100 (*)    Glucose, Bld 123 (*)    BUN 29 (*)    Creatinine, Ser 9.00 (*)    Calcium 8.2 (*)    GFR calc  non Af Amer 6 (*)    GFR calc Af Amer 7 (*)    All other components within normal limits    TROPONIN I    EKG  EKG Interpretation None       Radiology Dg Chest Portable 1 View  Result Date: 10/31/2016 CLINICAL DATA:  Shortness of breath history of renal transplantation with subsequent removal of the transplanted kidney, dialysis dependent renal failure, diabetes, asthma, current smoker. EXAM: PORTABLE CHEST 1 VIEW COMPARISON:  Chest x-ray of September 18, 2016 FINDINGS: The lungs are well-expanded. The interstitial markings are increased and slightly more conspicuous today. The cardiac silhouette is mildly enlarged. The pulmonary vascularity is engorged and less distinct today. There is calcification in the wall of the aortic arch. The trachea is midline. There is no pleural effusion. The bony thorax exhibits no acute abnormality. IMPRESSION: Acute on chronic CHF. Underlying COPD -reactive airway disease. No acute pneumonia. Thoracic aortic atherosclerosis. Electronically Signed   By: David  Martinique M.D.   On: 10/31/2016 09:03    Procedures Procedures (including critical care time)  Medications Ordered in ED Medications  furosemide (LASIX) injection 40 mg (not administered)  ipratropium-albuterol (DUONEB) 0.5-2.5 (3) MG/3ML nebulizer solution 3 mL (3 mLs Nebulization Given 10/31/16 0939)  methylPREDNISolone sodium succinate (SOLU-MEDROL) 125 mg/2 mL injection 125 mg (125 mg Intravenous Given 10/31/16 0934)     Initial Impression / Assessment and Plan / ED Course  I have reviewed the triage vital signs and the nursing notes.  Pertinent labs & imaging results that were available during my care of the patient were reviewed by me and considered in my medical decision making (see chart for details).     Patient presents with dyspnea during dialysis. Chest x-ray reveals acute on chronic CHF. Discussed with nephrologist on call. Will admit to general medicine.  Final Clinical Impressions(s) / ED Diagnoses   Final diagnoses:  Acute congestive heart failure, unspecified heart  failure type ([place])  ESRD (end stage renal disease) ([place])    New Prescriptions New Prescriptions   No medications on file     Nat Christen, MD 10/31/16 1023

## 2016-10-31 NOTE — Progress Notes (Signed)
Patient seen during dialysis this afternoon in the unit. Safely taken off BiPAP and maintaining sats on 2 L via nasal cannula while he was eating. Will monitor him in the stepdown unit overnight.

## 2016-11-01 DIAGNOSIS — R0600 Dyspnea, unspecified: Secondary | ICD-10-CM | POA: Diagnosis not present

## 2016-11-01 DIAGNOSIS — N2581 Secondary hyperparathyroidism of renal origin: Secondary | ICD-10-CM | POA: Diagnosis not present

## 2016-11-01 DIAGNOSIS — A0472 Enterocolitis due to Clostridium difficile, not specified as recurrent: Secondary | ICD-10-CM | POA: Diagnosis not present

## 2016-11-01 DIAGNOSIS — I10 Essential (primary) hypertension: Secondary | ICD-10-CM

## 2016-11-01 DIAGNOSIS — D631 Anemia in chronic kidney disease: Secondary | ICD-10-CM | POA: Diagnosis not present

## 2016-11-01 DIAGNOSIS — E662 Morbid (severe) obesity with alveolar hypoventilation: Secondary | ICD-10-CM | POA: Diagnosis not present

## 2016-11-01 DIAGNOSIS — E1121 Type 2 diabetes mellitus with diabetic nephropathy: Secondary | ICD-10-CM | POA: Diagnosis not present

## 2016-11-01 DIAGNOSIS — J810 Acute pulmonary edema: Secondary | ICD-10-CM | POA: Diagnosis not present

## 2016-11-01 DIAGNOSIS — E8770 Fluid overload, unspecified: Secondary | ICD-10-CM | POA: Diagnosis not present

## 2016-11-01 DIAGNOSIS — N186 End stage renal disease: Secondary | ICD-10-CM | POA: Diagnosis not present

## 2016-11-01 DIAGNOSIS — I5033 Acute on chronic diastolic (congestive) heart failure: Secondary | ICD-10-CM | POA: Diagnosis not present

## 2016-11-01 DIAGNOSIS — D696 Thrombocytopenia, unspecified: Secondary | ICD-10-CM | POA: Diagnosis not present

## 2016-11-01 DIAGNOSIS — I132 Hypertensive heart and chronic kidney disease with heart failure and with stage 5 chronic kidney disease, or end stage renal disease: Secondary | ICD-10-CM | POA: Diagnosis not present

## 2016-11-01 DIAGNOSIS — J9601 Acute respiratory failure with hypoxia: Secondary | ICD-10-CM | POA: Diagnosis not present

## 2016-11-01 DIAGNOSIS — Z992 Dependence on renal dialysis: Secondary | ICD-10-CM | POA: Diagnosis not present

## 2016-11-01 LAB — CBC
HEMATOCRIT: 39.4 % (ref 39.0–52.0)
HEMOGLOBIN: 12.7 g/dL — AB (ref 13.0–17.0)
MCH: 31.1 pg (ref 26.0–34.0)
MCHC: 32.2 g/dL (ref 30.0–36.0)
MCV: 96.3 fL (ref 78.0–100.0)
Platelets: 89 10*3/uL — ABNORMAL LOW (ref 150–400)
RBC: 4.09 MIL/uL — AB (ref 4.22–5.81)
RDW: 14.2 % (ref 11.5–15.5)
WBC: 3.9 10*3/uL — ABNORMAL LOW (ref 4.0–10.5)

## 2016-11-01 LAB — BASIC METABOLIC PANEL
ANION GAP: 15 (ref 5–15)
BUN: 36 mg/dL — ABNORMAL HIGH (ref 6–20)
CALCIUM: 9.2 mg/dL (ref 8.9–10.3)
CO2: 30 mmol/L (ref 22–32)
Chloride: 93 mmol/L — ABNORMAL LOW (ref 101–111)
Creatinine, Ser: 8.24 mg/dL — ABNORMAL HIGH (ref 0.61–1.24)
GFR calc non Af Amer: 7 mL/min — ABNORMAL LOW (ref >60)
GFR, EST AFRICAN AMERICAN: 8 mL/min — AB (ref >60)
GLUCOSE: 124 mg/dL — AB (ref 65–99)
POTASSIUM: 4.5 mmol/L (ref 3.5–5.1)
Sodium: 138 mmol/L (ref 135–145)

## 2016-11-01 LAB — GLUCOSE, CAPILLARY
GLUCOSE-CAPILLARY: 126 mg/dL — AB (ref 65–99)
Glucose-Capillary: 119 mg/dL — ABNORMAL HIGH (ref 65–99)
Glucose-Capillary: 105 mg/dL — ABNORMAL HIGH (ref 65–99)
Glucose-Capillary: 141 mg/dL — ABNORMAL HIGH (ref 65–99)

## 2016-11-01 LAB — HEPATITIS B SURFACE ANTIGEN: Hepatitis B Surface Ag: NEGATIVE

## 2016-11-01 LAB — PHOSPHORUS: Phosphorus: 4.3 mg/dL (ref 2.5–4.6)

## 2016-11-01 MED ORDER — VANCOMYCIN HCL 125 MG PO CAPS: 125.0000 mg | ORAL_CAPSULE | Freq: Four times a day (QID) | ORAL | 0 refills | Status: AC

## 2016-11-01 NOTE — Progress Notes (Signed)
Patient discharged home with wife. IV discontinued. Belongings sent home. Discharge instructions and prescriptions given to patient and wife. Patient walked out on his own.

## 2016-11-01 NOTE — Progress Notes (Signed)
Kirk [name]  MRN: [id number]  DOB/AGE: 54/19/1962 54 y.o.  Primary Care Physician:Reed, Tiffany L, DO  Admit date: 10/31/2016  Chief Complaint:  Chief Complaint  Patient presents with  . Shortness of Breath    S-Pt presented on  10/31/2016 with  Chief Complaint  Patient presents with  . Shortness of Breath  . Pt offers no new complaints. Pt says " My breathing is much better"    meds . amLODipine  10 mg Oral Daily  . aspirin EC  81 mg Oral Daily  . [START ON 11/02/2016] calcitRIOL  0.25 mcg Oral Q M,W,F-HD  . cholecalciferol  5,000 Units Oral Daily  . furosemide  40 mg Intravenous Once  . guaiFENesin  600 mg Oral BID  . hydrALAZINE  50 mg Oral Q8H  . insulin aspart  0-9 Units Subcutaneous Q4H  . metoprolol tartrate  75 mg Oral BID  . mometasone-formoterol  2 puff Inhalation BID  . multivitamin  1 tablet Oral QHS  . pravastatin  40 mg Oral QPM  . sevelamer carbonate  2,400 mg Oral TID WC  . vancomycin  125 mg Oral QID       Physical Exam: Vital signs in last 24 hours: Temp:  [97.4 F (36.3 C)-98.7 F (37.1 C)] 97.7 F (36.5 C) (10/11 0400) Pulse Rate:  [66-90] 77 (10/11 0800) Resp:  [11-26] 26 (10/11 0800) BP: (148-193)/(56-110) 193/110 (10/11 0800) SpO2:  [92 %-100 %] 94 % (10/11 0825) FiO2 (%):  [35 %] 35 % (10/10 0941) Weight:  [218 lb 14.7 oz (99.3 kg)-223 lb 8.7 oz (101.4 kg)] 218 lb 14.7 oz (99.3 kg) (10/11 0500) Weight change:  Last BM Date: 10/31/16  Intake/Output from previous day: 10/10 0701 - 10/11 0700 In: -  Out: 3000  No intake/output data recorded.   Physical Exam: General- pt is awake,alert, oriented to time place and person Resp- No acute REsp distress,  Minimal ronchi            cpap in situ CVS- S1S2 regular in rate and rhythm GIT- BS+, soft, NT, ND EXT- NO LE Edema, Cyanosis Access- left AVF  Lab Results: CBC  Recent Labs  10/31/16 0846 11/01/16 0412  WBC 3.2* 3.9*  HGB 12.0* 12.7*  HCT 37.2* 39.4  PLT 73* 89*     BMET  Recent Labs  10/31/16 0846 11/01/16 0412  NA 138 138  K 3.8 4.5  CL 100* 93*  CO2 28 30  GLUCOSE 123* 124*  BUN 29* 36*  CREATININE 9.00* 8.24*  CALCIUM 8.2* 9.2    MICRO Recent Results (from the past 240 hour(s))  Urine culture     Status: Abnormal   Collection Time: 10/26/16  5:00 PM  Result Value Ref Range Status   Specimen Description URINE, RANDOM  Final   Special Requests NONE  Final   Culture MULTIPLE SPECIES PRESENT, SUGGEST RECOLLECTION (A)  Final   Report Status 10/28/2016 FINAL  Final  Gastrointestinal Panel by PCR , Stool     Status: None   Collection Time: 10/26/16  8:21 PM  Result Value Ref Range Status   Campylobacter species NOT DETECTED NOT DETECTED Final   Plesimonas shigelloides NOT DETECTED NOT DETECTED Final   Salmonella species NOT DETECTED NOT DETECTED Final   Yersinia enterocolitica NOT DETECTED NOT DETECTED Final   Vibrio species NOT DETECTED NOT DETECTED Final   Vibrio cholerae NOT DETECTED NOT DETECTED Final   Enteroaggregative E coli (EAEC) NOT DETECTED NOT DETECTED Final  Enteropathogenic E coli (EPEC) NOT DETECTED NOT DETECTED Final   Enterotoxigenic E coli (ETEC) NOT DETECTED NOT DETECTED Final   Shiga like toxin producing E coli (STEC) NOT DETECTED NOT DETECTED Final   Shigella/Enteroinvasive E coli (EIEC) NOT DETECTED NOT DETECTED Final   Cryptosporidium NOT DETECTED NOT DETECTED Final   Cyclospora cayetanensis NOT DETECTED NOT DETECTED Final   Entamoeba histolytica NOT DETECTED NOT DETECTED Final   Giardia lamblia NOT DETECTED NOT DETECTED Final   Adenovirus F40/41 NOT DETECTED NOT DETECTED Final   Astrovirus NOT DETECTED NOT DETECTED Final   Norovirus GI/GII NOT DETECTED NOT DETECTED Final   Rotavirus A NOT DETECTED NOT DETECTED Final   Sapovirus (I, II, IV, and V) NOT DETECTED NOT DETECTED Final  C difficile quick scan w PCR reflex     Status: Abnormal   Collection Time: 10/26/16  8:21 PM  Result Value Ref Range Status    C Diff antigen POSITIVE (A) NEGATIVE Final   C Diff toxin NEGATIVE NEGATIVE Final   C Diff interpretation Results are indeterminate. See PCR results.  Final  Clostridium Difficile by PCR     Status: Abnormal   Collection Time: 10/26/16  8:21 PM  Result Value Ref Range Status   Toxigenic C Difficile by pcr POSITIVE (A) NEGATIVE Final    Comment: Positive for toxigenic C. difficile with little to no toxin production. Only treat if clinical presentation suggests symptomatic illness.  MRSA PCR Screening     Status: None   Collection Time: 10/31/16  1:46 PM  Result Value Ref Range Status   MRSA by PCR NEGATIVE NEGATIVE Final    Comment:        The GeneXpert MRSA Assay (FDA approved for NASAL specimens only), is one component of a comprehensive MRSA colonization surveillance program. It is not intended to diagnose MRSA infection nor to guide or monitor treatment for MRSA infections.       Lab Results  Component Value Date   PTH 429 04/30/2015   CALCIUM 9.2 11/01/2016   CAION 1.02 (L) 09/08/2013   PHOS 4.3 11/01/2016       Impression: 1)Renal  ESRD on Hd               Pt is On Monday/Wednesday/Friday schedule               Pt was  Dialyzed yesterday               Hx of failed transplant  2)HTN  Medication- On Calcium Channel Blockers On Alpha and beta Blockers   3)Anemia In ESRD the goal for hgb ia 9--11    Pt hgb is at goal   No need of epo  4)CKD Mineral-Bone Disorder Secondary Hyperparathyroidism present  Phosphorus now at goal.   5)Resp  admitted with pulmonary edema Primary MD following Now clinically better after HD yestersday  6)Electrolytes Normokalemic NOrmonatremic   7)Acid base Co2 at goal   8) ID- C diff  on PO vanco Primary team following   9) Thrombocytopenia   ? Etiology -infection-C diff    Pt does have hx of low platelets counts      Plan:  Will continue current care NO need for HD  today  BHUTANI,MANPREET S 11/01/2016, 9:22 AM

## 2016-11-01 NOTE — Discharge Summary (Addendum)
Physician Discharge Summary  Kirk [name] KDX:[id number] DOB: 07/16/1960 DOA: 10/31/2016  PCP: Gayland Curry, DO  Admit date: 10/31/2016 Discharge date: 11/01/2016  Time spent: 35* minutes  Recommendations for Outpatient Follow-up:  1. Follow PCP in 2 weeks   Discharge Diagnoses:  Principal Problem:   Acute respiratory failure with hypoxemia (HCC) Active Problems:   OSA on CPAP   HTN (hypertension)   DM (diabetes mellitus), type 2 with renal complications (HCC)   Thrombocytopenia (HCC)   ESRD on dialysis ([place])   Obesity hypoventilation syndrome (HCC)   Pulmonary edema   Clostridium difficile enteritis   Discharge Condition: Stable  Diet recommendation: Carb modified diet  Filed Weights   10/31/16 1322 10/31/16 1430 11/01/16 0500  Weight: 101.4 kg (223 lb 8.7 oz) 101.4 kg (223 lb 8.7 oz) 99.3 kg (218 lb 14.7 oz)    History of present illness:   56 y.o. male, With history of OSA/OHS, diabetes mellitus, ESRD on hemodialysis (M, W, F), chronic diastolic CHF, uncontrolled hypertension who was hospitalized about 5 weeks back with acute pulmonary edema in the setting of fluid overload and possible pneumonia who presented to the ED on 10/5 with several episodes of diarrhea for a few days and was found to have UTI and discharged on Keflex. His stool was positive for C. difficile and his gastroenterologist prescribed him 2 weeks of oral vancomycin which he filled only yesterday. Reported his diarrhea to reperfuse and almost 20 episodes initially (some mixed with blood) and about 5-6 episodes for the past 2 days. Also having some abdominal distention. Possibly due to being dehydrated with diarrhea wife reports that they have not been putting out much fluid during last 2 dialysis sessions.  Hospital Course:  Acute hypoxic respiratory failure-due to pulmonary edema from acute diastolic CHF. Patient was dialyzed yesterday and his breathing is improved. He is not requiring oxygen  anymore.  C. difficile enteritis- patient came with diarrhea mixed with blood 20 episodes per day on 10/26/2016, C. difficile PCR was positive. Patient was started on by mouth vancomycin which was filled only yesterday and took one dose so far. Patient was placed on vancomycin 125 mg 4 times a day for 2 weeks. Patient not sure whether he has medication at home. Will give him another prescription in case he does not have medication.  Obstructive sleep apnea-continue nighttime CPAP  Hypertension-continue antihypertensive medications as per home regimen.  Diabetes mellitus- not on home medications  ESRD on hemodialysis- patient was dialyzed yesterday, no urinalysis today. He can continue getting dialysis as outpatient.  Acute on chronic diastolic CHF-continue aspirin, beta blocker, statin.  COPD-stable  Thrombocytopenia-platelet count improved this morning, likely from infectious process.  Procedures:  None  Consultations: Nephrology  Discharge Exam: Vitals:   11/01/16 0825 11/01/16 0900  BP:  (!) 173/54  Pulse:  73  Resp:  (!) 23  Temp:    SpO2: 94% 90%    General: Appears in no acute distress Cardiovascular: S1-S2, regular Respiratory: Clear to auscultation bilaterally  Discharge Instructions   Discharge Instructions    Diet - low sodium heart healthy    Complete by:  As directed    Increase activity slowly    Complete by:  As directed      Current Discharge Medication List    CONTINUE these medications which have CHANGED   Details  vancomycin (VANCOCIN) 125 MG capsule Take 1 capsule (125 mg total) by mouth 4 (four) times daily. Qty: 56 capsule, Refills: 0  CONTINUE these medications which have NOT CHANGED   Details  acetaminophen (TYLENOL) 500 MG tablet Take 1 tablet (500 mg total) by mouth every 6 (six) hours as needed for moderate pain.    amLODipine (NORVASC) 10 MG tablet One twice daily to control BP Qty: 180 tablet, Refills: 3   Associated  Diagnoses: Essential hypertension    aspirin EC 81 MG tablet Take 81 mg by mouth daily.    b complex-vitamin c-folic acid (NEPHRO-VITE) 0.8 MG TABS tablet TAKE 1 TABLET BY MOUTH EVERY DAY Qty: 90 tablet, Refills: 1   Associated Diagnoses: ESRD on dialysis (HCC)    budesonide-formoterol (SYMBICORT) 80-4.5 MCG/ACT inhaler Inhale 2 puffs into the lungs 2 (two) times daily. Qty: 1 Inhaler, Refills: 1    calcitRIOL (ROCALTROL) 0.25 MCG capsule Take 1 capsule (0.25 mcg total) by mouth every Monday, Wednesday, and Friday with hemodialysis. Qty: 30 capsule, Refills: 1    Cholecalciferol (VITAMIN D3) 5000 UNITS TABS Take 1 tablet by mouth daily.    clonazePAM (KLONOPIN) 0.5 MG tablet Take 1 tablet (0.5 mg total) by mouth 2 (two) times daily. Qty: 30 tablet, Refills: 0    diphenhydrAMINE (BENADRYL) 25 MG tablet Take 25 mg by mouth at bedtime as needed for allergies.    guaiFENesin (MUCINEX) 600 MG 12 hr tablet Take 600 mg by mouth 2 (two) times daily.    guaiFENesin-dextromethorphan (ROBITUSSIN DM) 100-10 MG/5ML syrup Take 10 mLs by mouth every 4 (four) hours as needed for cough.    hydrALAZINE (APRESOLINE) 50 MG tablet Take 1 tablet (50 mg total) by mouth every 8 (eight) hours. Qty: 90 tablet, Refills: 1    metoCLOPramide (REGLAN) 10 MG tablet Take 10 mg by mouth as needed for nausea.    metoprolol tartrate (LOPRESSOR) 50 MG tablet Take One and a half tablet by mouth twice daily Qty: 270 tablet, Refills: 3    omeprazole (PRILOSEC) 20 MG capsule TAKE 1 CAPSULE (20 MG TOTAL) BY MOUTH 2 (TWO) TIMES DAILY BEFORE A MEAL. Qty: 60 capsule, Refills: 0    ondansetron (ZOFRAN ODT) 4 MG disintegrating tablet Take 1 tablet (4 mg total) by mouth every 8 (eight) hours as needed for nausea or vomiting. Qty: 20 tablet, Refills: 0    pravastatin (PRAVACHOL) 40 MG tablet Take 1 tablet (40 mg total) by mouth every evening. Qty: 90 tablet, Refills: 1    sevelamer carbonate (RENVELA) 800 MG tablet Take 3  tablets (2,400 mg total) by mouth 3 (three) times daily with meals. Qty: 270 tablet, Refills: 0    traMADol (ULTRAM) 50 MG tablet Take 1 tablet (50 mg total) by mouth daily as needed (severe headache). Qty: 30 tablet, Refills: 0   Associated Diagnoses: Tension headache      STOP taking these medications     cephALEXin (KEFLEX) 500 MG capsule        Allergies  Allergen Reactions  . Codeine Nausea And Vomiting and Nausea Only    Altered mental status      The results of significant diagnostics from this hospitalization (including imaging, microbiology, ancillary and laboratory) are listed below for reference.    Significant Diagnostic Studies: Dg Chest Portable 1 View  Result Date: 10/31/2016 CLINICAL DATA:  Shortness of breath history of renal transplantation with subsequent removal of the transplanted kidney, dialysis dependent renal failure, diabetes, asthma, current smoker. EXAM: PORTABLE CHEST 1 VIEW COMPARISON:  Chest x-ray of September 18, 2016 FINDINGS: The lungs are well-expanded. The interstitial markings are increased and slightly  more conspicuous today. The cardiac silhouette is mildly enlarged. The pulmonary vascularity is engorged and less distinct today. There is calcification in the wall of the aortic arch. The trachea is midline. There is no pleural effusion. The bony thorax exhibits no acute abnormality. IMPRESSION: Acute on chronic CHF. Underlying COPD -reactive airway disease. No acute pneumonia. Thoracic aortic atherosclerosis. Electronically Signed   By: David  Martinique M.D.   On: 10/31/2016 09:03    Microbiology: Recent Results (from the past 240 hour(s))  Urine culture     Status: Abnormal   Collection Time: 10/26/16  5:00 PM  Result Value Ref Range Status   Specimen Description URINE, RANDOM  Final   Special Requests NONE  Final   Culture MULTIPLE SPECIES PRESENT, SUGGEST RECOLLECTION (A)  Final   Report Status 10/28/2016 FINAL  Final  Gastrointestinal Panel  by PCR , Stool     Status: None   Collection Time: 10/26/16  8:21 PM  Result Value Ref Range Status   Campylobacter species NOT DETECTED NOT DETECTED Final   Plesimonas shigelloides NOT DETECTED NOT DETECTED Final   Salmonella species NOT DETECTED NOT DETECTED Final   Yersinia enterocolitica NOT DETECTED NOT DETECTED Final   Vibrio species NOT DETECTED NOT DETECTED Final   Vibrio cholerae NOT DETECTED NOT DETECTED Final   Enteroaggregative E coli (EAEC) NOT DETECTED NOT DETECTED Final   Enteropathogenic E coli (EPEC) NOT DETECTED NOT DETECTED Final   Enterotoxigenic E coli (ETEC) NOT DETECTED NOT DETECTED Final   Shiga like toxin producing E coli (STEC) NOT DETECTED NOT DETECTED Final   Shigella/Enteroinvasive E coli (EIEC) NOT DETECTED NOT DETECTED Final   Cryptosporidium NOT DETECTED NOT DETECTED Final   Cyclospora cayetanensis NOT DETECTED NOT DETECTED Final   Entamoeba histolytica NOT DETECTED NOT DETECTED Final   Giardia lamblia NOT DETECTED NOT DETECTED Final   Adenovirus F40/41 NOT DETECTED NOT DETECTED Final   Astrovirus NOT DETECTED NOT DETECTED Final   Norovirus GI/GII NOT DETECTED NOT DETECTED Final   Rotavirus A NOT DETECTED NOT DETECTED Final   Sapovirus (I, II, IV, and V) NOT DETECTED NOT DETECTED Final  C difficile quick scan w PCR reflex     Status: Abnormal   Collection Time: 10/26/16  8:21 PM  Result Value Ref Range Status   C Diff antigen POSITIVE (A) NEGATIVE Final   C Diff toxin NEGATIVE NEGATIVE Final   C Diff interpretation Results are indeterminate. See PCR results.  Final  Clostridium Difficile by PCR     Status: Abnormal   Collection Time: 10/26/16  8:21 PM  Result Value Ref Range Status   Toxigenic C Difficile by pcr POSITIVE (A) NEGATIVE Final    Comment: Positive for toxigenic C. difficile with little to no toxin production. Only treat if clinical presentation suggests symptomatic illness.  MRSA PCR Screening     Status: None   Collection Time: 10/31/16   1:46 PM  Result Value Ref Range Status   MRSA by PCR NEGATIVE NEGATIVE Final    Comment:        The GeneXpert MRSA Assay (FDA approved for NASAL specimens only), is one component of a comprehensive MRSA colonization surveillance program. It is not intended to diagnose MRSA infection nor to guide or monitor treatment for MRSA infections.      Labs: Basic Metabolic Panel:  Recent Labs Lab 10/26/16 1705 10/31/16 0846 11/01/16 0412  NA 138 138 138  K 3.5 3.8 4.5  CL 99* 100* 93*  CO2 25  28 30  GLUCOSE 173* 123* 124*  BUN 20 29* 36*  CREATININE 7.91* 9.00* 8.24*  CALCIUM 8.7* 8.2* 9.2  PHOS  --   --  4.3   Liver Function Tests:  Recent Labs Lab 10/26/16 1705 10/31/16 0846  AST 26 23  ALT 15* 19  ALKPHOS 78 93  BILITOT 0.4 0.7  PROT 6.7 6.9  ALBUMIN 3.8 3.7    Recent Labs Lab 10/26/16 1705  LIPASE 89*   No results for input(s): AMMONIA in the last 168 hours. CBC:  Recent Labs Lab 10/26/16 1705 10/31/16 0846 11/01/16 0412  WBC 3.7* 3.2* 3.9*  NEUTROABS  --  2.1  --   HGB 12.4* 12.0* 12.7*  HCT 39.3 37.2* 39.4  MCV 96.6 98.2 96.3  PLT 90* 73* 89*   Cardiac Enzymes:  Recent Labs Lab 10/31/16 0846  TROPONINI <0.03   BNP: BNP (last 3 results)  Recent Labs  01/24/16 1740 09/12/16 2338  BNP 182.0* 517.0*    ProBNP (last 3 results) No results for input(s): PROBNP in the last 8760 hours.  CBG:  Recent Labs Lab 10/31/16 1638 10/31/16 1954 11/01/16 0022 11/01/16 0358 11/01/16 0734  GLUCAP 152* 251* 126* 119* 105*       Signed:  Oswald Hillock MD.  [facility] 11/01/2016, 11:03 AM

## 2016-11-02 ENCOUNTER — Emergency Department (HOSPITAL_COMMUNITY): Payer: Medicare Other

## 2016-11-02 ENCOUNTER — Inpatient Hospital Stay (HOSPITAL_COMMUNITY)
Admission: EM | Admit: 2016-11-02 | Discharge: 2016-11-11 | DRG: 193 | Disposition: A | Discharge status: Home / Self Care | Payer: Medicare Other | Attending: Internal Medicine | Admitting: Internal Medicine

## 2016-11-02 ENCOUNTER — Encounter (HOSPITAL_COMMUNITY): Payer: Self-pay | Admitting: *Deleted

## 2016-11-02 DIAGNOSIS — I10 Essential (primary) hypertension: Secondary | ICD-10-CM | POA: Diagnosis not present

## 2016-11-02 DIAGNOSIS — E1122 Type 2 diabetes mellitus with diabetic chronic kidney disease: Secondary | ICD-10-CM | POA: Diagnosis present

## 2016-11-02 DIAGNOSIS — G40209 Localization-related (focal) (partial) symptomatic epilepsy and epileptic syndromes with complex partial seizures, not intractable, without status epilepticus: Secondary | ICD-10-CM | POA: Diagnosis present

## 2016-11-02 DIAGNOSIS — D649 Anemia, unspecified: Secondary | ICD-10-CM | POA: Diagnosis not present

## 2016-11-02 DIAGNOSIS — N2581 Secondary hyperparathyroidism of renal origin: Secondary | ICD-10-CM | POA: Diagnosis present

## 2016-11-02 DIAGNOSIS — R0602 Shortness of breath: Secondary | ICD-10-CM

## 2016-11-02 DIAGNOSIS — F419 Anxiety disorder, unspecified: Secondary | ICD-10-CM | POA: Diagnosis present

## 2016-11-02 DIAGNOSIS — E785 Hyperlipidemia, unspecified: Secondary | ICD-10-CM | POA: Diagnosis not present

## 2016-11-02 DIAGNOSIS — R0600 Dyspnea, unspecified: Secondary | ICD-10-CM | POA: Diagnosis not present

## 2016-11-02 DIAGNOSIS — F17290 Nicotine dependence, other tobacco product, uncomplicated: Secondary | ICD-10-CM | POA: Diagnosis present

## 2016-11-02 DIAGNOSIS — I132 Hypertensive heart and chronic kidney disease with heart failure and with stage 5 chronic kidney disease, or end stage renal disease: Secondary | ICD-10-CM | POA: Diagnosis present

## 2016-11-02 DIAGNOSIS — Z7951 Long term (current) use of inhaled steroids: Secondary | ICD-10-CM

## 2016-11-02 DIAGNOSIS — Z7982 Long term (current) use of aspirin: Secondary | ICD-10-CM

## 2016-11-02 DIAGNOSIS — E662 Morbid (severe) obesity with alveolar hypoventilation: Secondary | ICD-10-CM | POA: Diagnosis present

## 2016-11-02 DIAGNOSIS — J181 Lobar pneumonia, unspecified organism: Secondary | ICD-10-CM | POA: Diagnosis not present

## 2016-11-02 DIAGNOSIS — Z79899 Other long term (current) drug therapy: Secondary | ICD-10-CM

## 2016-11-02 DIAGNOSIS — R079 Chest pain, unspecified: Secondary | ICD-10-CM | POA: Diagnosis not present

## 2016-11-02 DIAGNOSIS — N186 End stage renal disease: Secondary | ICD-10-CM

## 2016-11-02 DIAGNOSIS — R5382 Chronic fatigue, unspecified: Secondary | ICD-10-CM | POA: Diagnosis not present

## 2016-11-02 DIAGNOSIS — I120 Hypertensive chronic kidney disease with stage 5 chronic kidney disease or end stage renal disease: Secondary | ICD-10-CM | POA: Diagnosis not present

## 2016-11-02 DIAGNOSIS — J440 Chronic obstructive pulmonary disease with acute lower respiratory infection: Secondary | ICD-10-CM | POA: Diagnosis present

## 2016-11-02 DIAGNOSIS — K219 Gastro-esophageal reflux disease without esophagitis: Secondary | ICD-10-CM | POA: Diagnosis present

## 2016-11-02 DIAGNOSIS — N400 Enlarged prostate without lower urinary tract symptoms: Secondary | ICD-10-CM | POA: Diagnosis present

## 2016-11-02 DIAGNOSIS — D631 Anemia in chronic kidney disease: Secondary | ICD-10-CM | POA: Diagnosis not present

## 2016-11-02 DIAGNOSIS — E1129 Type 2 diabetes mellitus with other diabetic kidney complication: Secondary | ICD-10-CM | POA: Diagnosis present

## 2016-11-02 DIAGNOSIS — J9601 Acute respiratory failure with hypoxia: Secondary | ICD-10-CM | POA: Diagnosis present

## 2016-11-02 DIAGNOSIS — K625 Hemorrhage of anus and rectum: Secondary | ICD-10-CM | POA: Diagnosis not present

## 2016-11-02 DIAGNOSIS — R509 Fever, unspecified: Secondary | ICD-10-CM | POA: Diagnosis not present

## 2016-11-02 DIAGNOSIS — D696 Thrombocytopenia, unspecified: Secondary | ICD-10-CM | POA: Diagnosis present

## 2016-11-02 DIAGNOSIS — E559 Vitamin D deficiency, unspecified: Secondary | ICD-10-CM | POA: Diagnosis present

## 2016-11-02 DIAGNOSIS — E871 Hypo-osmolality and hyponatremia: Secondary | ICD-10-CM

## 2016-11-02 DIAGNOSIS — D61818 Other pancytopenia: Secondary | ICD-10-CM | POA: Diagnosis present

## 2016-11-02 DIAGNOSIS — A0472 Enterocolitis due to Clostridium difficile, not specified as recurrent: Secondary | ICD-10-CM | POA: Diagnosis present

## 2016-11-02 DIAGNOSIS — I5032 Chronic diastolic (congestive) heart failure: Secondary | ICD-10-CM | POA: Diagnosis present

## 2016-11-02 DIAGNOSIS — Z940 Kidney transplant status: Secondary | ICD-10-CM

## 2016-11-02 DIAGNOSIS — E669 Obesity, unspecified: Secondary | ICD-10-CM | POA: Diagnosis present

## 2016-11-02 DIAGNOSIS — E8889 Other specified metabolic disorders: Secondary | ICD-10-CM | POA: Diagnosis present

## 2016-11-02 DIAGNOSIS — R0603 Acute respiratory distress: Secondary | ICD-10-CM | POA: Diagnosis not present

## 2016-11-02 DIAGNOSIS — R791 Abnormal coagulation profile: Secondary | ICD-10-CM | POA: Diagnosis present

## 2016-11-02 DIAGNOSIS — N189 Chronic kidney disease, unspecified: Secondary | ICD-10-CM

## 2016-11-02 DIAGNOSIS — Z992 Dependence on renal dialysis: Secondary | ICD-10-CM | POA: Diagnosis not present

## 2016-11-02 DIAGNOSIS — Z09 Encounter for follow-up examination after completed treatment for conditions other than malignant neoplasm: Secondary | ICD-10-CM

## 2016-11-02 DIAGNOSIS — R1084 Generalized abdominal pain: Secondary | ICD-10-CM | POA: Diagnosis not present

## 2016-11-02 DIAGNOSIS — Z6832 Body mass index (BMI) 32.0-32.9, adult: Secondary | ICD-10-CM

## 2016-11-02 DIAGNOSIS — E1121 Type 2 diabetes mellitus with diabetic nephropathy: Secondary | ICD-10-CM | POA: Diagnosis not present

## 2016-11-02 DIAGNOSIS — R5383 Other fatigue: Secondary | ICD-10-CM | POA: Diagnosis present

## 2016-11-02 DIAGNOSIS — R109 Unspecified abdominal pain: Secondary | ICD-10-CM | POA: Diagnosis not present

## 2016-11-02 DIAGNOSIS — R197 Diarrhea, unspecified: Secondary | ICD-10-CM | POA: Diagnosis not present

## 2016-11-02 DIAGNOSIS — J189 Pneumonia, unspecified organism: Secondary | ICD-10-CM | POA: Diagnosis not present

## 2016-11-02 DIAGNOSIS — R0609 Other forms of dyspnea: Secondary | ICD-10-CM | POA: Diagnosis present

## 2016-11-02 DIAGNOSIS — R601 Generalized edema: Secondary | ICD-10-CM | POA: Diagnosis not present

## 2016-11-02 DIAGNOSIS — R918 Other nonspecific abnormal finding of lung field: Secondary | ICD-10-CM | POA: Diagnosis not present

## 2016-11-02 DIAGNOSIS — R609 Edema, unspecified: Secondary | ICD-10-CM | POA: Diagnosis present

## 2016-11-02 DIAGNOSIS — I5030 Unspecified diastolic (congestive) heart failure: Secondary | ICD-10-CM | POA: Diagnosis not present

## 2016-11-02 LAB — HEMOGLOBIN A1C
Hgb A1c MFr Bld: 5.4 % (ref 4.8–5.6)
MEAN PLASMA GLUCOSE: 108.28 mg/dL

## 2016-11-02 LAB — COMPREHENSIVE METABOLIC PANEL
ALT: 19 U/L (ref 17–63)
AST: 27 U/L (ref 15–41)
Albumin: 3.5 g/dL (ref 3.5–5.0)
Alkaline Phosphatase: 88 U/L (ref 38–126)
Anion gap: 16 — ABNORMAL HIGH (ref 5–15)
BUN: 57 mg/dL — ABNORMAL HIGH (ref 6–20)
CHLORIDE: 96 mmol/L — AB (ref 101–111)
CO2: 25 mmol/L (ref 22–32)
CREATININE: 13.07 mg/dL — AB (ref 0.61–1.24)
Calcium: 8.5 mg/dL — ABNORMAL LOW (ref 8.9–10.3)
GFR calc non Af Amer: 4 mL/min — ABNORMAL LOW (ref >60)
GFR, EST AFRICAN AMERICAN: 4 mL/min — AB (ref >60)
Glucose, Bld: 96 mg/dL (ref 65–99)
POTASSIUM: 4.2 mmol/L (ref 3.5–5.1)
SODIUM: 137 mmol/L (ref 135–145)
Total Bilirubin: 0.9 mg/dL (ref 0.3–1.2)
Total Protein: 6.3 g/dL — ABNORMAL LOW (ref 6.5–8.1)

## 2016-11-02 LAB — CBC WITH DIFFERENTIAL/PLATELET
BLASTS: 0 %
Band Neutrophils: 3 %
Basophils Absolute: 0 10*3/uL (ref 0.0–0.1)
Basophils Relative: 0 %
EOS PCT: 0 %
Eosinophils Absolute: 0 10*3/uL (ref 0.0–0.7)
HEMATOCRIT: 36.8 % — AB (ref 39.0–52.0)
Hemoglobin: 11.8 g/dL — ABNORMAL LOW (ref 13.0–17.0)
LYMPHS ABS: 1 10*3/uL (ref 0.7–4.0)
LYMPHS PCT: 29 %
MCH: 30.4 pg (ref 26.0–34.0)
MCHC: 32.1 g/dL (ref 30.0–36.0)
MCV: 94.8 fL (ref 78.0–100.0)
METAMYELOCYTES PCT: 0 %
MONOS PCT: 10 %
Monocytes Absolute: 0.3 10*3/uL (ref 0.1–1.0)
Myelocytes: 0 %
NEUTROS ABS: 2.1 10*3/uL (ref 1.7–7.7)
NRBC: 0 /100{WBCs}
Neutrophils Relative %: 58 %
OTHER: 0 %
PLATELETS: 73 10*3/uL — AB (ref 150–400)
Promyelocytes Absolute: 0 %
RBC: 3.88 MIL/uL — AB (ref 4.22–5.81)
RDW: 14.8 % (ref 11.5–15.5)
WBC: 3.4 10*3/uL — AB (ref 4.0–10.5)

## 2016-11-02 LAB — I-STAT TROPONIN, ED: Troponin i, poc: 0.02 ng/mL (ref 0.00–0.08)

## 2016-11-02 LAB — I-STAT CG4 LACTIC ACID, ED
LACTIC ACID, VENOUS: 1.15 mmol/L (ref 0.5–1.9)
Lactic Acid, Venous: 1.66 mmol/L (ref 0.5–1.9)

## 2016-11-02 LAB — GLUCOSE, CAPILLARY: GLUCOSE-CAPILLARY: 131 mg/dL — AB (ref 65–99)

## 2016-11-02 LAB — INFLUENZA PANEL BY PCR (TYPE A & B)
INFLAPCR: NEGATIVE
Influenza B By PCR: NEGATIVE

## 2016-11-02 LAB — LIPASE, BLOOD: LIPASE: 74 U/L — AB (ref 11–51)

## 2016-11-02 LAB — MRSA PCR SCREENING: MRSA by PCR: NEGATIVE

## 2016-11-02 LAB — PROTIME-INR
INR: 1.06
Prothrombin Time: 13.7 seconds (ref 11.4–15.2)

## 2016-11-02 MED ORDER — GUAIFENESIN-DM 100-10 MG/5ML PO SYRP
10.0000 mL | ORAL_SOLUTION | ORAL | Status: DC | PRN
  Administered 2016-11-05 – 2016-11-09 (×5): 10 mL via ORAL
  Filled 2016-11-02 (×5): qty 10

## 2016-11-02 MED ORDER — VANCOMYCIN HCL 125 MG PO CAPS: 125.0000 mg | ORAL_CAPSULE | Freq: Four times a day (QID) | ORAL | Status: DC

## 2016-11-02 MED ORDER — AMLODIPINE BESYLATE 10 MG PO TABS
10.0000 mg | ORAL_TABLET | Freq: Every day | ORAL | Status: DC
  Administered 2016-11-03 – 2016-11-06 (×4): 10 mg via ORAL
  Filled 2016-11-02 (×4): qty 1

## 2016-11-02 MED ORDER — ACETAMINOPHEN 500 MG PO TABS
ORAL_TABLET | ORAL | Status: AC
  Administered 2016-11-02: 1000 mg
  Filled 2016-11-02: qty 2

## 2016-11-02 MED ORDER — ONDANSETRON 4 MG PO TBDP: 4.0000 mg | ORAL_TABLET | Freq: Three times a day (TID) | ORAL | Status: DC | PRN

## 2016-11-02 MED ORDER — ASPIRIN EC 81 MG PO TBEC
81.0000 mg | DELAYED_RELEASE_TABLET | Freq: Every day | ORAL | Status: DC
  Administered 2016-11-03 – 2016-11-11 (×8): 81 mg via ORAL
  Filled 2016-11-02 (×9): qty 1

## 2016-11-02 MED ORDER — SENNOSIDES-DOCUSATE SODIUM 8.6-50 MG PO TABS: 1.0000 | ORAL_TABLET | Freq: Every evening | ORAL | Status: DC | PRN

## 2016-11-02 MED ORDER — HYDROCODONE-ACETAMINOPHEN 5-325 MG PO TABS
1.0000 | ORAL_TABLET | ORAL | Status: DC | PRN
  Administered 2016-11-06 – 2016-11-07 (×3): 1 via ORAL
  Administered 2016-11-09 (×2): 2 via ORAL
  Filled 2016-11-02 (×3): qty 1
  Filled 2016-11-02: qty 2

## 2016-11-02 MED ORDER — DIPHENHYDRAMINE HCL 25 MG PO TABS
25.0000 mg | ORAL_TABLET | Freq: Every evening | ORAL | Status: DC | PRN
Start: 2016-11-02 — End: 2016-11-02
  Filled 2016-11-02: qty 1

## 2016-11-02 MED ORDER — INSULIN ASPART 100 UNIT/ML ~~LOC~~ SOLN
0.0000 [IU] | Freq: Three times a day (TID) | SUBCUTANEOUS | Status: DC
  Administered 2016-11-03 – 2016-11-10 (×4): 1 [IU] via SUBCUTANEOUS
  Administered 2016-11-11: 2 [IU] via SUBCUTANEOUS

## 2016-11-02 MED ORDER — CALCITRIOL 0.25 MCG PO CAPS
0.2500 ug | ORAL_CAPSULE | ORAL | Status: DC
  Administered 2016-11-05 – 2016-11-07 (×2): 0.25 ug via ORAL
  Filled 2016-11-02: qty 1

## 2016-11-02 MED ORDER — PANTOPRAZOLE SODIUM 40 MG PO TBEC: 40.0000 mg | DELAYED_RELEASE_TABLET | Freq: Every day | ORAL | Status: DC

## 2016-11-02 MED ORDER — PIPERACILLIN-TAZOBACTAM 3.375 G IVPB: 3.3750 g | Freq: Three times a day (TID) | INTRAVENOUS | Status: DC

## 2016-11-02 MED ORDER — ACETAMINOPHEN 500 MG PO TABS
1000.0000 mg | ORAL_TABLET | Freq: Once | ORAL | Status: AC
  Administered 2016-11-02: 1000 mg via ORAL

## 2016-11-02 MED ORDER — TRAMADOL HCL 50 MG PO TABS
50.0000 mg | ORAL_TABLET | Freq: Once | ORAL | Status: AC
  Administered 2016-11-02: 50 mg via ORAL
  Filled 2016-11-02: qty 1

## 2016-11-02 MED ORDER — HYDRALAZINE HCL 50 MG PO TABS
50.0000 mg | ORAL_TABLET | Freq: Three times a day (TID) | ORAL | Status: DC
  Administered 2016-11-02 – 2016-11-09 (×16): 50 mg via ORAL
  Filled 2016-11-02 (×18): qty 1

## 2016-11-02 MED ORDER — ACETAMINOPHEN 500 MG PO TABS
ORAL_TABLET | ORAL | Status: AC
  Filled 2016-11-02: qty 2

## 2016-11-02 MED ORDER — PRAVASTATIN SODIUM 40 MG PO TABS
40.0000 mg | ORAL_TABLET | Freq: Every evening | ORAL | Status: DC
  Administered 2016-11-03 – 2016-11-10 (×6): 40 mg via ORAL
  Filled 2016-11-02 (×6): qty 1

## 2016-11-02 MED ORDER — RENA-VITE PO TABS
1.0000 | ORAL_TABLET | Freq: Every day | ORAL | Status: DC
  Administered 2016-11-02 – 2016-11-10 (×8): 1 via ORAL
  Filled 2016-11-02 (×9): qty 1

## 2016-11-02 MED ORDER — METOPROLOL TARTRATE 50 MG PO TABS
75.0000 mg | ORAL_TABLET | Freq: Two times a day (BID) | ORAL | Status: DC
  Administered 2016-11-02 – 2016-11-06 (×9): 75 mg via ORAL
  Filled 2016-11-02 (×9): qty 1

## 2016-11-02 MED ORDER — VANCOMYCIN 50 MG/ML ORAL SOLUTION
125.0000 mg | Freq: Four times a day (QID) | ORAL | Status: DC
  Administered 2016-11-02 – 2016-11-06 (×13): 125 mg via ORAL
  Filled 2016-11-02 (×15): qty 2.5

## 2016-11-02 MED ORDER — ACETAMINOPHEN 500 MG PO TABS
1000.0000 mg | ORAL_TABLET | Freq: Once | ORAL | Status: AC
  Administered 2016-11-05: 1000 mg via ORAL
  Filled 2016-11-02: qty 2

## 2016-11-02 MED ORDER — ACETAMINOPHEN 650 MG RE SUPP: 650.0000 mg | Freq: Four times a day (QID) | RECTAL | Status: DC | PRN

## 2016-11-02 MED ORDER — ONDANSETRON HCL 4 MG/2ML IJ SOLN
4.0000 mg | Freq: Four times a day (QID) | INTRAMUSCULAR | Status: DC | PRN
  Administered 2016-11-02 – 2016-11-10 (×6): 4 mg via INTRAVENOUS
  Filled 2016-11-02 (×6): qty 2

## 2016-11-02 MED ORDER — MOMETASONE FURO-FORMOTEROL FUM 100-5 MCG/ACT IN AERO
2.0000 | INHALATION_SPRAY | Freq: Two times a day (BID) | RESPIRATORY_TRACT | Status: DC
  Administered 2016-11-03 – 2016-11-10 (×4): 2 via RESPIRATORY_TRACT
  Filled 2016-11-02 (×2): qty 8.8

## 2016-11-02 MED ORDER — SEVELAMER CARBONATE 800 MG PO TABS
2400.0000 mg | ORAL_TABLET | Freq: Three times a day (TID) | ORAL | Status: DC
  Administered 2016-11-03 – 2016-11-11 (×18): 2400 mg via ORAL
  Filled 2016-11-02 (×18): qty 3

## 2016-11-02 MED ORDER — ACETAMINOPHEN 500 MG PO TABS: 500.0000 mg | ORAL_TABLET | Freq: Four times a day (QID) | ORAL | Status: DC | PRN

## 2016-11-02 MED ORDER — ACETAMINOPHEN 325 MG PO TABS: 325.0000 mg | ORAL_TABLET | Freq: Once | ORAL | Status: DC

## 2016-11-02 MED ORDER — GUAIFENESIN ER 600 MG PO TB12
600.0000 mg | ORAL_TABLET | Freq: Two times a day (BID) | ORAL | Status: DC
  Administered 2016-11-02 – 2016-11-11 (×16): 600 mg via ORAL
  Filled 2016-11-02 (×16): qty 1

## 2016-11-02 MED ORDER — CLONAZEPAM 0.5 MG PO TABS
0.5000 mg | ORAL_TABLET | Freq: Two times a day (BID) | ORAL | Status: DC
  Administered 2016-11-02 – 2016-11-11 (×17): 0.5 mg via ORAL
  Filled 2016-11-02 (×16): qty 1

## 2016-11-02 MED ORDER — VANCOMYCIN HCL 1000 MG IV SOLR
1500.0000 mg | Freq: Once | INTRAVENOUS | Status: AC
  Administered 2016-11-02: 1500 mg via INTRAVENOUS
  Filled 2016-11-02 (×2): qty 1500

## 2016-11-02 MED ORDER — VITAMIN D 1000 UNITS PO TABS
1000.0000 [IU] | ORAL_TABLET | Freq: Every day | ORAL | Status: DC
  Administered 2016-11-03 – 2016-11-11 (×8): 1000 [IU] via ORAL
  Filled 2016-11-02 (×8): qty 1

## 2016-11-02 MED ORDER — DIPHENHYDRAMINE HCL 25 MG PO CAPS
25.0000 mg | ORAL_CAPSULE | Freq: Every evening | ORAL | Status: DC | PRN
  Administered 2016-11-08: 25 mg via ORAL
  Filled 2016-11-02: qty 1

## 2016-11-02 MED ORDER — ENOXAPARIN SODIUM 40 MG/0.4ML ~~LOC~~ SOLN: 40.0000 mg | SUBCUTANEOUS | Status: DC

## 2016-11-02 MED ORDER — PIPERACILLIN-TAZOBACTAM 3.375 G IVPB 30 MIN: 3.3750 g | Freq: Once | INTRAVENOUS | Status: DC

## 2016-11-02 MED ORDER — IPRATROPIUM-ALBUTEROL 0.5-2.5 (3) MG/3ML IN SOLN
3.0000 mL | Freq: Three times a day (TID) | RESPIRATORY_TRACT | Status: DC
  Administered 2016-11-03 (×3): 3 mL via RESPIRATORY_TRACT
  Filled 2016-11-02 (×3): qty 3

## 2016-11-02 MED ORDER — ONDANSETRON HCL 4 MG PO TABS: 4.0000 mg | ORAL_TABLET | Freq: Four times a day (QID) | ORAL | Status: DC | PRN

## 2016-11-02 MED ORDER — IPRATROPIUM-ALBUTEROL 0.5-2.5 (3) MG/3ML IN SOLN
3.0000 mL | RESPIRATORY_TRACT | Status: DC
  Administered 2016-11-02: 3 mL via RESPIRATORY_TRACT
  Filled 2016-11-02: qty 3

## 2016-11-02 MED ORDER — TRAMADOL HCL 50 MG PO TABS
50.0000 mg | ORAL_TABLET | Freq: Every day | ORAL | Status: DC | PRN
  Administered 2016-11-04 – 2016-11-09 (×4): 50 mg via ORAL
  Filled 2016-11-02 (×4): qty 1

## 2016-11-02 MED ORDER — ACETAMINOPHEN 325 MG PO TABS
650.0000 mg | ORAL_TABLET | Freq: Four times a day (QID) | ORAL | Status: DC | PRN
  Administered 2016-11-02 – 2016-11-04 (×5): 650 mg via ORAL
  Filled 2016-11-02 (×6): qty 2

## 2016-11-02 MED ORDER — METOCLOPRAMIDE HCL 10 MG PO TABS: 10.0000 mg | ORAL_TABLET | ORAL | Status: DC | PRN

## 2016-11-02 MED ORDER — CALCITRIOL 0.25 MCG PO CAPS
1.2500 ug | ORAL_CAPSULE | ORAL | Status: DC
  Administered 2016-11-02 – 2016-11-09 (×3): 1.25 ug via ORAL
  Filled 2016-11-02 (×3): qty 1

## 2016-11-02 NOTE — Consult Note (Signed)
[facility] Renal Consultation Note    Indication for Consultation:  Management of ESRD/hemodialysis, anemia, hypertension/volume, and secondary hyperparathyroidism. PCP:  HPI: Kirk CALIXTE is a 56 y.o. male with ESRD, HTN, OSA, COPD (prior smoker), diastolic HF, GERD, chronic headaches, and recent hospitalization at [name] with [place] is being readmitted with fever and dyspnea.  Was Dx UTI on 10/5 in [facility] ED. Given Keflex. Called at home on 10/8 to inform him that C.diff (which was checked on 10/5) came back positive. Was called in Rx for PO Vancomcyin. His wife reports that he took 1 day of this medication before transported to [facility] after his last HD with dyspnea. He was dialyzed there on 10/10. Plan was for him to resume outpt HD today, but his usual HD center ([place]) has no power.   Today, he is febrile with T102.28F, looks ill. Labs show WBC 3.4, Hgb 11.8, K 3.4, CO2 25. CXR shows no edema or pneumonia. Blood Cx were drawn and pending. At this point, he is complaining of severe frontal headache. Low appetite for the past week. + dyspnea without CP. No N/V, but ongoing diarrhea present.  Usually dialyzes MWF at [facility]. Using AVF without issues.   Past Medical History:  Diagnosis Date  . Acute edema of lung, unspecified   . Acute, but ill-defined, cerebrovascular disease   . Allergy   . Anemia   . 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)   . Anxiety   . Asthma   . Carpal tunnel syndrome   . Cellulitis and abscess of trunk   . Cholelithiasis 07/13/2014  . Chronic headaches   . Debility, unspecified   . Dermatophytosis of the body   . Dysrhythmia    history of  . Edema   . End stage renal disease on dialysis [facility])    "MWF; [facility]" (10/21/2014)  . Essential hypertension, benign   . GERD (gastroesophageal reflux disease)   . Gout, unspecified   . HTN (hypertension)   . Hypertrophy of prostate without  urinary obstruction and other lower urinary tract symptoms (LUTS)   . Hypotension, unspecified   . Impotence of organic origin   . Insomnia, unspecified   . Kidney replaced by transplant   . Localization-related (focal) (partial) epilepsy and epileptic syndromes with complex partial seizures, without mention of intractable epilepsy   . Lumbago   . Memory loss   . OSA on CPAP   . Other and unspecified hyperlipidemia   . Other chronic nonalcoholic liver disease   . Other malaise and fatigue   . Other nonspecific abnormal serum enzyme levels   . Pain in joint, lower leg   . Pain in joint, upper arm   . Pneumonia "several times"  . Renal dialysis status(V45.11) 02/05/2010   restarted 01/02/13 ofter renal trransplant failure  . Secondary hyperparathyroidism (of renal origin)   . Shortness of breath   . Sleep apnea   . Tension headache   . Unspecified constipation   . Unspecified essential hypertension   . Unspecified hereditary and idiopathic peripheral neuropathy   . Unspecified vitamin D deficiency    Past Surgical History:  Procedure Laterality Date  . AV FISTULA PLACEMENT Left ?2010   "forearm; at [facility]"  . BACK SURGERY    . CARDIAC CATHETERIZATION  03/21/2011  . CHOLECYSTECTOMY N/A 10/21/2014   Procedure: LAPAROSCOPIC CHOLECYSTECTOMY WITH INTRAOPERATIVE CHOLANGIOGRAM;  Surgeon: Autumn Messing III, MD;  Location: [place];  Service:  General;  Laterality: N/A;  . COLONOSCOPY    . 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 KIDNEY TRANSPLANT  08/17/2011   [place]  10/21/2014   w/IOC  . LEFT HEART CATHETERIZATION WITH CORONARY ANGIOGRAM N/A 03/21/2011   Procedure: LEFT HEART CATHETERIZATION WITH CORONARY ANGIOGRAM;  Surgeon: [name], MD;  Location: [facility] CATH LAB;  Service: Cardiovascular;  Laterality: N/A;  . NEPHRECTOMY  08/2013   removed transplaned kidney  . POSTERIOR FUSION CERVICAL SPINE  06/25/2012   for spinal  stenosis  . VASECTOMY  2010   Family History  Problem Relation Age of Onset  . Adopted: Yes  . Colon cancer Neg Hx   . Esophageal cancer Neg Hx   . Rectal cancer Neg Hx   . Stomach cancer Neg Hx    Social History:  reports that he has been smoking E-cigarettes.  He has a 16.00 pack-year smoking history. He has never used smokeless tobacco. He reports that he does not drink alcohol or use drugs.  ROS: As per HPI otherwise negative.  Physical Exam: Vitals:   11/02/16 1245 11/02/16 1300 11/02/16 1315 11/02/16 1344  BP:      Pulse: 90 86 90   Resp:      Temp:    (!) 102.4 F (39.1 C)  TempSrc:    Oral  SpO2: 94% 94% 94%      General: Well developed, well nourished, in no acute distress. Head: Normocephalic, atraumatic, sclera non-icteric, mucus membranes are moist. Neck: Supple without lymphadenopathy/masses.  Lungs: Coarse air movement, scattered expiratory wheezing. Heart: RRR with normal S1, S2. No murmurs, rubs, or gallops appreciated. Abdomen: Soft, non-tender, non-distended with normoactive bowel sounds. No rebound/guarding. No obvious abdominal masses. Musculoskeletal:  Strength and tone appear normal for age. Lower extremities: 1+ LE edema. No ischemic changes, no open wounds. Neuro: Alert and oriented X 3. Moves all extremities spontaneously. Psych:  Responds to questions appropriately with a normal affect. Dialysis Access: L forearm AVF + thrill  Allergies  Allergen Reactions  . Codeine Nausea And Vomiting and Nausea Only    Altered mental status   Prior to Admission medications   Medication Sig Start Date End Date Taking? Authorizing Provider  acetaminophen (TYLENOL) 500 MG tablet Take 1 tablet (500 mg total) by mouth every 6 (six) hours as needed for moderate pain. 09/12/14   Samuella Cota, MD  amLODipine (NORVASC) 10 MG tablet One twice daily to control BP 06/21/16   Lauree Chandler, NP  aspirin EC 81 MG tablet Take 81 mg by mouth daily.    [provider]  b complex-vitamin c-folic acid (NEPHRO-VITE) 0.8 MG TABS tablet TAKE 1 TABLET BY MOUTH EVERY DAY 03/29/16   Estill Dooms, MD  budesonide-formoterol [facility]) 80-4.5 MCG/ACT inhaler Inhale 2 puffs into the lungs 2 (two) times daily. 09/21/14   Bonnielee Haff, MD  calcitRIOL (ROCALTROL) 0.25 MCG capsule Take 1 capsule (0.25 mcg total) by mouth every Monday, Wednesday, and Friday with hemodialysis. 09/21/14   Bonnielee Haff, MD  Cholecalciferol (VITAMIN D3) 5000 UNITS TABS Take 1 tablet by mouth daily.    [provider]  clonazePAM (KLONOPIN) 0.5 MG tablet Take 1 tablet (0.5 mg total) by mouth 2 (two) times daily. 09/22/16   Orvan Falconer, MD  diphenhydrAMINE (BENADRYL) 25 MG tablet Take 25 mg by mouth at bedtime as needed for allergies.    [provider]  guaiFENesin ([place]) 600  MG 12 hr tablet Take 600 mg by mouth 2 (two) times daily.    [provider]  guaiFENesin-dextromethorphan (ROBITUSSIN DM) 100-10 MG/5ML syrup Take 10 mLs by mouth every 4 (four) hours as needed for cough.    [provider]  hydrALAZINE (APRESOLINE) 50 MG tablet Take 1 tablet (50 mg total) by mouth every 8 (eight) hours. 09/22/16   Orvan Falconer, MD  metoCLOPramide (REGLAN) 10 MG tablet Take 10 mg by mouth as needed for nausea.    [provider]  metoprolol tartrate (LOPRESSOR) 50 MG tablet Take One and a half tablet by mouth twice daily 10/22/16   Reed, Tiffany L, DO  omeprazole (PRILOSEC) 20 MG capsule TAKE 1 CAPSULE (20 MG TOTAL) BY MOUTH 2 (TWO) TIMES DAILY BEFORE A MEAL. 09/10/16   Milus Banister, MD  ondansetron (ZOFRAN ODT) 4 MG disintegrating tablet Take 1 tablet (4 mg total) by mouth every 8 (eight) hours as needed for nausea or vomiting. 10/26/16   Kinnie Feil, PA-C  pravastatin (PRAVACHOL) 40 MG tablet Take 1 tablet (40 mg total) by mouth every evening. 06/21/16   Lauree Chandler, NP  sevelamer carbonate (RENVELA) 800 MG tablet Take 3 tablets (2,400 mg  total) by mouth 3 (three) times daily with meals. 09/22/16   Orvan Falconer, MD  traMADol (ULTRAM) 50 MG tablet Take 1 tablet (50 mg total) by mouth daily as needed (severe headache). 10/08/16   Reed, Tiffany L, DO  vancomycin (VANCOCIN) 125 MG capsule Take 1 capsule (125 mg total) by mouth 4 (four) times daily. 11/01/16 11/15/16  Oswald Hillock, MD   Current Facility-Administered Medications  Medication Dose Route Frequency Provider Last Rate Last Dose  . acetaminophen (TYLENOL) 500 MG tablet            Current Outpatient Prescriptions  Medication Sig Dispense Refill  . acetaminophen (TYLENOL) 500 MG tablet Take 1 tablet (500 mg total) by mouth every 6 (six) hours as needed for moderate pain.    Marland Kitchen amLODipine (NORVASC) 10 MG tablet One twice daily to control BP 180 tablet 3  . aspirin EC 81 MG tablet Take 81 mg by mouth daily.    Marland Kitchen b complex-vitamin c-folic acid (NEPHRO-VITE) 0.8 MG TABS tablet TAKE 1 TABLET BY MOUTH EVERY DAY 90 tablet 1  . budesonide-formoterol (SYMBICORT) 80-4.5 MCG/ACT inhaler Inhale 2 puffs into the lungs 2 (two) times daily. 1 Inhaler 1  . calcitRIOL (ROCALTROL) 0.25 MCG capsule Take 1 capsule (0.25 mcg total) by mouth every Monday, Wednesday, and Friday with hemodialysis. 30 capsule 1  . Cholecalciferol (VITAMIN D3) 5000 UNITS TABS Take 1 tablet by mouth daily.    . clonazePAM (KLONOPIN) 0.5 MG tablet Take 1 tablet (0.5 mg total) by mouth 2 (two) times daily. 30 tablet 0  . diphenhydrAMINE (BENADRYL) 25 MG tablet Take 25 mg by mouth at bedtime as needed for allergies.    Marland Kitchen guaiFENesin (MUCINEX) 600 MG 12 hr tablet Take 600 mg by mouth 2 (two) times daily.    Marland Kitchen guaiFENesin-dextromethorphan (ROBITUSSIN DM) 100-10 MG/5ML syrup Take 10 mLs by mouth every 4 (four) hours as needed for cough.    . hydrALAZINE (APRESOLINE) 50 MG tablet Take 1 tablet (50 mg total) by mouth every 8 (eight) hours. 90 tablet 1  . metoCLOPramide (REGLAN) 10 MG tablet Take 10 mg by mouth as needed for nausea.     . metoprolol tartrate (LOPRESSOR) 50 MG tablet Take One and a half tablet by mouth twice  daily 270 tablet 3  . omeprazole (PRILOSEC) 20 MG capsule TAKE 1 CAPSULE (20 MG TOTAL) BY MOUTH 2 (TWO) TIMES DAILY BEFORE A MEAL. 60 capsule 0  . ondansetron (ZOFRAN ODT) 4 MG disintegrating tablet Take 1 tablet (4 mg total) by mouth every 8 (eight) hours as needed for nausea or vomiting. 20 tablet 0  . pravastatin (PRAVACHOL) 40 MG tablet Take 1 tablet (40 mg total) by mouth every evening. 90 tablet 1  . sevelamer carbonate (RENVELA) 800 MG tablet Take 3 tablets (2,400 mg total) by mouth 3 (three) times daily with meals. 270 tablet 0  . traMADol (ULTRAM) 50 MG tablet Take 1 tablet (50 mg total) by mouth daily as needed (severe headache). 30 tablet 0  . vancomycin (VANCOCIN) 125 MG capsule Take 1 capsule (125 mg total) by mouth 4 (four) times daily. 56 capsule 0   Labs: Basic Metabolic Panel:  Recent Labs Lab 10/31/16 0846 11/01/16 0412 11/02/16 0840  NA 138 138 137  K 3.8 4.5 4.2  CL 100* 93* 96*  CO2 28 30 25   GLUCOSE 123* 124* 96  BUN 29* 36* 57*  CREATININE 9.00* 8.24* 13.07*  CALCIUM 8.2* 9.2 8.5*  PHOS  --  4.3  --    Liver Function Tests:  Recent Labs Lab 10/26/16 1705 10/31/16 0846 11/02/16 0840  AST 26 23 27   ALT 15* 19 19  ALKPHOS 78 93 88  BILITOT 0.4 0.7 0.9  PROT 6.7 6.9 6.3*  ALBUMIN 3.8 3.7 3.5    Recent Labs Lab 10/26/16 1705 11/02/16 0801  LIPASE 89* 74*   CBC:  Recent Labs Lab 10/26/16 1705 10/31/16 0846 11/01/16 0412 11/02/16 0840  WBC 3.7* 3.2* 3.9* 3.4*  NEUTROABS  --  2.1  --  2.1  HGB 12.4* 12.0* 12.7* 11.8*  HCT 39.3 37.2* 39.4 36.8*  MCV 96.6 98.2 96.3 94.8  PLT 90* 73* 89* 73*   Cardiac Enzymes:  Recent Labs Lab 10/31/16 0846  TROPONINI <0.03   CBG:  Recent Labs Lab 10/31/16 1954 11/01/16 0022 11/01/16 0358 11/01/16 0734 11/01/16 1120  GLUCAP 251* 126* 119* 105* 141*   Studies/Results: Dg Chest 2 View  Result Date:  11/02/2016 CLINICAL DATA:  Fever, chest pain, weakness EXAM: CHEST  2 VIEW COMPARISON:  10/31/2016 FINDINGS: Heart and mediastinal contours are within normal limits. No focal opacities or effusions. No acute bony abnormality. IMPRESSION: No active cardiopulmonary disease. Electronically Signed   By: Rolm Baptise M.D.   On: 11/02/2016 08:10    Dialysis Orders:  MWF at [facility] 4:15hr, BFR 450, DFR 800, EDW 100kg, 2K/2Ca, AVF, Heparin 4000 bolus - Calcitriol 1.26mcg PO q HD - Venofer 50mg  IV weekly - BMM meds: Phoslo 4/meals  Assessment/Plan: 1.  Fever: BCx pending. Ongoing C.diff v. UTI v. other infectious etiology. Will order flu test. 2.  Dyspnea: Will try to lower EDW with HD. 3.  ESRD: Continue HD per MWF schedule, for HD today. 4.  Hypertension/volume: BP high, as above. For increased UF with HD today. 5.  Anemia: Hgb 11.8. No ESA for now. 6.  Metabolic bone disease: Ca/Phos ok. Continue binders. 7.  Headache: Agreed to order tramadol x 1 for him. Chronic issue, per wife. 8. COPD 9. Diastolic HF  Veneta Penton, PA-C 11/02/2016, 1:53 PM  [facility] Pager: [phone]

## 2016-11-02 NOTE — Procedures (Signed)
I was present at this dialysis session. I have reviewed the session itself and made appropriate changes.   2K bath. 3L UF. AVF.  Follow resp status.    Recent Labs Lab 11/01/16 0412 11/02/16 0840  NA 138 137  K 4.5 4.2  CL 93* 96*  CO2 30 25  GLUCOSE 124* 96  BUN 36* 57*  CREATININE 8.24* 13.07*  CALCIUM 9.2 8.5*  PHOS 4.3  --      Recent Labs Lab 10/31/16 0846 11/01/16 0412 11/02/16 0840  WBC 3.2* 3.9* 3.4*  NEUTROABS 2.1  --  2.1  HGB 12.0* 12.7* 11.8*  HCT 37.2* 39.4 36.8*  MCV 98.2 96.3 94.8  PLT 73* 89* 73*    Scheduled Meds: . acetaminophen      . acetaminophen  1,000 mg Oral Once  . [START ON 11/03/2016] amLODipine  10 mg Oral Daily  . aspirin EC  81 mg Oral Daily  . [START ON 11/05/2016] calcitRIOL  0.25 mcg Oral Q M,W,F-HD  . calcitRIOL  1.25 mcg Oral Once per day on Mon Wed Fri  . clonazePAM  0.5 mg Oral BID  . enoxaparin (LOVENOX) injection  40 mg Subcutaneous Q24H  . guaiFENesin  600 mg Oral BID  . hydrALAZINE  50 mg Oral Q8H  . insulin aspart  0-9 Units Subcutaneous TID WC  . metoprolol tartrate  75 mg Oral BID  . mometasone-formoterol  2 puff Inhalation BID  . multivitamin  1 tablet Oral QHS  . pantoprazole  40 mg Oral Daily  . pravastatin  40 mg Oral QPM  . sevelamer carbonate  2,400 mg Oral TID WC  . vancomycin  125 mg Oral QID  . Vitamin D3  1 tablet Oral Daily   Continuous Infusions: PRN Meds:.acetaminophen **OR** acetaminophen, acetaminophen, diphenhydrAMINE, guaiFENesin-dextromethorphan, HYDROcodone-acetaminophen, metoCLOPramide, ondansetron **OR** ondansetron (ZOFRAN) IV, ondansetron, senna-docusate, traMADol   Kirk Grippe  MD 11/02/2016, 3:14 PM

## 2016-11-02 NOTE — ED Notes (Signed)
This RN spoke with Melissa in Dialysis, Lenna Sciara advised they are ready for patient but if he does not have a bed when session is finished he will have to return to the ED. Darci Current, CHarge RN made aware. Report given to [facility], also made aware of patient's fever and that this RN had not been able to get in touch with physician for tylenol order. Melissa advised she spoke with MD and had an order for tylenol and that patient could come up and they would administer it to him when he arrived in dialysis.

## 2016-11-02 NOTE — H&P (Signed)
History and Physical    KOBE OFALLON URK:[id number] DOB: April 28, 1960 DOA: 11/02/2016  PCP: Gayland Curry, DO Patient coming from:   Chief Complaint: fever/sob  HPI: Kirk BETSCH is a 56 y.o. male with medical history significant ESRD on dialysis MWF, HTN, osa, copd, diastolic HF GERD chronic headache presents to the emergency department with chief complaint gradual worsening shortness of breath general fatigue fever increased cough. Initial evaluation reveals fever signs of volume overload.  Information is obtained from the patient and his chart. Patient was hospitalized about 5 weeks ago with acute pulmonary edema in the setting of fluid overload and possible pneumonia. He also went to the emergency department last week several episodes of diarrhea was found to have a UTI and discharged on Keflex. In October 8 he was called at home and informed that stool was also positive for C. difficile and he was given 2 weeks of oral vancomycin and she started 6 days ago. He was discharged from [facility] yesterday he developed gradual worsening shortness of breath increased cough and persistent diarrhea. Associated symptoms include intermittent chest pain associated with coughing. He denies headache dizziness syncope or near-syncope. He denies worsening lower extremity edema. He does state he was prepared to go to dialysis today but the center was closed do no electricity.    ED Course: In the emergency department he has a persistent temperature of 102 hemodynamically stable mild tachypnea. He is not hypoxic.  Review of Systems: As per HPI otherwise all other systems reviewed and are negative.   Ambulatory Status: Ambulates independently is independent with ADLs  Past Medical History:  Diagnosis Date  . Acute edema of lung, unspecified   . Acute, but ill-defined, cerebrovascular disease   . Allergy   . Anemia   . 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)   . Anxiety   . Asthma   . Carpal  tunnel syndrome   . Cellulitis and abscess of trunk   . Cholelithiasis 07/13/2014  . Chronic headaches   . Debility, unspecified   . Dermatophytosis of the body   . Dysrhythmia    history of  . Edema   . End stage renal disease on dialysis [facility])    "MWF; [facility]" (10/21/2014)  . Essential hypertension, benign   . GERD (gastroesophageal reflux disease)   . Gout, unspecified   . HTN (hypertension)   . Hypertrophy of prostate without urinary obstruction and other lower urinary tract symptoms (LUTS)   . Hypotension, unspecified   . Impotence of organic origin   . Insomnia, unspecified   . Kidney replaced by transplant   . Localization-related (focal) (partial) epilepsy and epileptic syndromes with complex partial seizures, without mention of intractable epilepsy   . Lumbago   . Memory loss   . OSA on CPAP   . Other and unspecified hyperlipidemia   . Other chronic nonalcoholic liver disease   . Other malaise and fatigue   . Other nonspecific abnormal serum enzyme levels   . Pain in joint, lower leg   . Pain in joint, upper arm   . Pneumonia "several times"  . Renal dialysis status(V45.11) 02/05/2010   restarted 01/02/13 ofter renal trransplant failure  . Secondary hyperparathyroidism (of renal origin)   . Shortness of breath   . Sleep apnea   . Tension headache   . Unspecified constipation   . Unspecified essential hypertension   . Unspecified hereditary and idiopathic peripheral neuropathy   . Unspecified vitamin D  deficiency     Past Surgical History:  Procedure Laterality Date  . AV FISTULA PLACEMENT Left ?2010   "forearm; at [facility]"  . BACK SURGERY    . CARDIAC CATHETERIZATION  03/21/2011  . CHOLECYSTECTOMY N/A 10/21/2014   Procedure: LAPAROSCOPIC CHOLECYSTECTOMY WITH INTRAOPERATIVE CHOLANGIOGRAM;  Surgeon: Autumn Messing III, MD;  Location: [place];  Service: General;  Laterality: N/A;  . COLONOSCOPY    . 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 KIDNEY TRANSPLANT  08/17/2011   [place]  10/21/2014   w/IOC  . LEFT HEART CATHETERIZATION WITH CORONARY ANGIOGRAM N/A 03/21/2011   Procedure: LEFT HEART CATHETERIZATION WITH CORONARY ANGIOGRAM;  Surgeon: [name], MD;  Location: [facility] CATH LAB;  Service: Cardiovascular;  Laterality: N/A;  . NEPHRECTOMY  08/2013   removed transplaned kidney  . POSTERIOR FUSION CERVICAL SPINE  06/25/2012   for spinal stenosis  . VASECTOMY  2010    Social History   Social History  . Marital status: Married    Spouse name: [name]  . Number of children: 3  . Years of education: N/A   Occupational History  . disabled Disabled   Social History Main Topics  . Smoking status: Current Every Day Smoker    Packs/day: 0.50    Years: 32.00    Types: E-cigarettes    Last attempt to quit: 08/23/2014  . Smokeless tobacco: Never Used  . Alcohol use No  . Drug use: No  . Sexual activity: Yes   Other Topics Concern  . Not on file   Social History Narrative   Drinks 1 cup of caffeine daily.    Allergies  Allergen Reactions  . Codeine Nausea And Vomiting and Nausea Only    Altered mental status    Family History  Problem Relation Age of Onset  . Adopted: Yes  . Colon cancer Neg Hx   . Esophageal cancer Neg Hx   . Rectal cancer Neg Hx   . Stomach cancer Neg Hx     Prior to Admission medications   Medication Sig Start Date End Date Taking? Authorizing Provider  acetaminophen (TYLENOL) 500 MG tablet Take 1 tablet (500 mg total) by mouth every 6 (six) hours as needed for moderate pain. 09/12/14   Samuella Cota, MD  amLODipine (NORVASC) 10 MG tablet One twice daily to control BP 06/21/16   Lauree Chandler, NP  aspirin EC 81 MG tablet Take 81 mg by mouth daily.    [provider]  b complex-vitamin c-folic acid (NEPHRO-VITE) 0.8 MG TABS tablet TAKE 1 TABLET BY MOUTH EVERY DAY 03/29/16   Estill Dooms, MD    budesonide-formoterol [facility]) 80-4.5 MCG/ACT inhaler Inhale 2 puffs into the lungs 2 (two) times daily. 09/21/14   Bonnielee Haff, MD  calcitRIOL (ROCALTROL) 0.25 MCG capsule Take 1 capsule (0.25 mcg total) by mouth every Monday, Wednesday, and Friday with hemodialysis. 09/21/14   Bonnielee Haff, MD  Cholecalciferol (VITAMIN D3) 5000 UNITS TABS Take 1 tablet by mouth daily.    [provider]  clonazePAM (KLONOPIN) 0.5 MG tablet Take 1 tablet (0.5 mg total) by mouth 2 (two) times daily. 09/22/16   Orvan Falconer, MD  diphenhydrAMINE (BENADRYL) 25 MG tablet Take 25 mg by mouth at bedtime as needed for allergies.    [provider]  guaiFENesin (MUCINEX) 600 MG 12 hr tablet Take 600 mg by mouth 2 (two) times daily.  [provider]  guaiFENesin-dextromethorphan (ROBITUSSIN DM) 100-10 MG/5ML syrup Take 10 mLs by mouth every 4 (four) hours as needed for cough.    [provider]  hydrALAZINE (APRESOLINE) 50 MG tablet Take 1 tablet (50 mg total) by mouth every 8 (eight) hours. 09/22/16   Orvan Falconer, MD  metoCLOPramide (REGLAN) 10 MG tablet Take 10 mg by mouth as needed for nausea.    [provider]  metoprolol tartrate (LOPRESSOR) 50 MG tablet Take One and a half tablet by mouth twice daily 10/22/16   Reed, Tiffany L, DO  omeprazole (PRILOSEC) 20 MG capsule TAKE 1 CAPSULE (20 MG TOTAL) BY MOUTH 2 (TWO) TIMES DAILY BEFORE A MEAL. 09/10/16   Milus Banister, MD  ondansetron (ZOFRAN ODT) 4 MG disintegrating tablet Take 1 tablet (4 mg total) by mouth every 8 (eight) hours as needed for nausea or vomiting. 10/26/16   Kinnie Feil, PA-C  pravastatin (PRAVACHOL) 40 MG tablet Take 1 tablet (40 mg total) by mouth every evening. 06/21/16   Lauree Chandler, NP  sevelamer carbonate (RENVELA) 800 MG tablet Take 3 tablets (2,400 mg total) by mouth 3 (three) times daily with meals. 09/22/16   Orvan Falconer, MD  traMADol (ULTRAM) 50 MG tablet Take 1 tablet (50 mg total) by mouth  daily as needed (severe headache). 10/08/16   Reed, Tiffany L, DO  vancomycin (VANCOCIN) 125 MG capsule Take 1 capsule (125 mg total) by mouth 4 (four) times daily. 11/01/16 11/15/16  Oswald Hillock, MD    Physical Exam: Vitals:   11/02/16 1245 11/02/16 1300 11/02/16 1315 11/02/16 1344  BP:      Pulse: 90 86 90   Resp:      Temp:    (!) 102.4 F (39.1 C)  TempSrc:    Oral  SpO2: 94% 94% 94%      General:  Appears calm , Lying in bed in no acute distress but appears somewhat ill Eyes:  PERRL, EOMI, normal lids, iris ENT:  grossly normal hearing, lips & tongue, mucous membranes of his mouth are pink but dry Neck:  no LAD, masses or thyromegaly Cardiovascular:  RRR, no m/r/g. No LE edema.  Respiratory:  Respirations somewhat shallow breath sounds somewhat distant fine crackles expiratory wheezes normal effort Abdomen:  soft, obese positive bowel sounds nontender to palpation Skin:  no rash or induration seen on limited exam Musculoskeletal:  grossly normal tone BUE/BLE, good ROM, no bony abnormality Psychiatric:  grossly normal mood and affect, speech fluent and appropriate, AOx3 Neurologic:  CN 2-12 grossly intact, moves all extremities in coordinated fashion, sensation intact speech clear facial symmetry  Labs on Admission: I have personally reviewed following labs and imaging studies  CBC:  Recent Labs Lab 10/26/16 1705 10/31/16 0846 11/01/16 0412 11/02/16 0840  WBC 3.7* 3.2* 3.9* 3.4*  NEUTROABS  --  2.1  --  2.1  HGB 12.4* 12.0* 12.7* 11.8*  HCT 39.3 37.2* 39.4 36.8*  MCV 96.6 98.2 96.3 94.8  PLT 90* 73* 89* 73*   Basic Metabolic Panel:  Recent Labs Lab 10/26/16 1705 10/31/16 0846 11/01/16 0412 11/02/16 0840  NA 138 138 138 137  K 3.5 3.8 4.5 4.2  CL 99* 100* 93* 96*  CO2 25 28 30 25   GLUCOSE 173* 123* 124* 96  BUN 20 29* 36* 57*  CREATININE 7.91* 9.00* 8.24* 13.07*  CALCIUM 8.7* 8.2* 9.2 8.5*  PHOS  --   --  4.3  --    GFR:  Estimated Creatinine  Clearance: 7.3 mL/min (A) (by C-G formula based on SCr of 13.07 mg/dL (H)). Liver Function Tests:  Recent Labs Lab 10/26/16 1705 10/31/16 0846 11/02/16 0840  AST 26 23 27   ALT 15* 19 19  ALKPHOS 78 93 88  BILITOT 0.4 0.7 0.9  PROT 6.7 6.9 6.3*  ALBUMIN 3.8 3.7 3.5    Recent Labs Lab 10/26/16 1705 11/02/16 0801  LIPASE 89* 74*   No results for input(s): AMMONIA in the last 168 hours. Coagulation Profile:  Recent Labs Lab 11/02/16 0840  INR 1.06   Cardiac Enzymes:  Recent Labs Lab 10/31/16 0846  TROPONINI <0.03   BNP (last 3 results) No results for input(s): PROBNP in the last 8760 hours. HbA1C: No results for input(s): HGBA1C in the last 72 hours. CBG:  Recent Labs Lab 10/31/16 1954 11/01/16 0022 11/01/16 0358 11/01/16 0734 11/01/16 1120  GLUCAP 251* 126* 119* 105* 141*   Lipid Profile: No results for input(s): CHOL, HDL, LDLCALC, TRIG, CHOLHDL, LDLDIRECT in the last 72 hours. Thyroid Function Tests: No results for input(s): TSH, T4TOTAL, FREET4, T3FREE, THYROIDAB in the last 72 hours. Anemia Panel: No results for input(s): VITAMINB12, FOLATE, FERRITIN, TIBC, IRON, RETICCTPCT in the last 72 hours. Urine analysis:    Component Value Date/Time   COLORURINE YELLOW 10/26/2016 1700   APPEARANCEUR HAZY (A) 10/26/2016 1700   LABSPEC 1.012 10/26/2016 1700   PHURINE 6.0 10/26/2016 1700   GLUCOSEU 50 (A) 10/26/2016 1700   HGBUR NEGATIVE 10/26/2016 1700   BILIRUBINUR NEGATIVE 10/26/2016 1700   KETONESUR NEGATIVE 10/26/2016 1700   PROTEINUR >=300 (A) 10/26/2016 1700   UROBILINOGEN 0.2 04/06/2014 1030   NITRITE NEGATIVE 10/26/2016 1700   LEUKOCYTESUR MODERATE (A) 10/26/2016 1700    Creatinine Clearance: Estimated Creatinine Clearance: 7.3 mL/min (A) (by C-G formula based on SCr of 13.07 mg/dL (H)).  Sepsis Labs: @LABRCNTIP (procalcitonin:4,lacticidven:4) ) Recent Results (from the past 240 hour(s))  Urine culture     Status: Abnormal   Collection  Time: 10/26/16  5:00 PM  Result Value Ref Range Status   Specimen Description URINE, RANDOM  Final   Special Requests NONE  Final   Culture MULTIPLE SPECIES PRESENT, SUGGEST RECOLLECTION (A)  Final   Report Status 10/28/2016 FINAL  Final  Gastrointestinal Panel by PCR , Stool     Status: None   Collection Time: 10/26/16  8:21 PM  Result Value Ref Range Status   Campylobacter species NOT DETECTED NOT DETECTED Final   Plesimonas shigelloides NOT DETECTED NOT DETECTED Final   Salmonella species NOT DETECTED NOT DETECTED Final   Yersinia enterocolitica NOT DETECTED NOT DETECTED Final   Vibrio species NOT DETECTED NOT DETECTED Final   Vibrio cholerae NOT DETECTED NOT DETECTED Final   Enteroaggregative E coli (EAEC) NOT DETECTED NOT DETECTED Final   Enteropathogenic E coli (EPEC) NOT DETECTED NOT DETECTED Final   Enterotoxigenic E coli (ETEC) NOT DETECTED NOT DETECTED Final   Shiga like toxin producing E coli (STEC) NOT DETECTED NOT DETECTED Final   Shigella/Enteroinvasive E coli (EIEC) NOT DETECTED NOT DETECTED Final   Cryptosporidium NOT DETECTED NOT DETECTED Final   Cyclospora cayetanensis NOT DETECTED NOT DETECTED Final   Entamoeba histolytica NOT DETECTED NOT DETECTED Final   Giardia lamblia NOT DETECTED NOT DETECTED Final   Adenovirus F40/41 NOT DETECTED NOT DETECTED Final   Astrovirus NOT DETECTED NOT DETECTED Final   Norovirus GI/GII NOT DETECTED NOT DETECTED Final   Rotavirus A NOT DETECTED NOT DETECTED Final   Sapovirus (I,  II, IV, and V) NOT DETECTED NOT DETECTED Final  C difficile quick scan w PCR reflex     Status: Abnormal   Collection Time: 10/26/16  8:21 PM  Result Value Ref Range Status   C Diff antigen POSITIVE (A) NEGATIVE Final   C Diff toxin NEGATIVE NEGATIVE Final   C Diff interpretation Results are indeterminate. See PCR results.  Final  Clostridium Difficile by PCR     Status: Abnormal   Collection Time: 10/26/16  8:21 PM  Result Value Ref Range Status    Toxigenic C Difficile by pcr POSITIVE (A) NEGATIVE Final    Comment: Positive for toxigenic C. difficile with little to no toxin production. Only treat if clinical presentation suggests symptomatic illness.  MRSA PCR Screening     Status: None   Collection Time: 10/31/16  1:46 PM  Result Value Ref Range Status   MRSA by PCR NEGATIVE NEGATIVE Final    Comment:        The GeneXpert MRSA Assay (FDA approved for NASAL specimens only), is one component of a comprehensive MRSA colonization surveillance program. It is not intended to diagnose MRSA infection nor to guide or monitor treatment for MRSA infections.      Radiological Exams on Admission: Dg Chest 2 View  Result Date: 11/02/2016 CLINICAL DATA:  Fever, chest pain, weakness EXAM: CHEST  2 VIEW COMPARISON:  10/31/2016 FINDINGS: Heart and mediastinal contours are within normal limits. No focal opacities or effusions. No acute bony abnormality. IMPRESSION: No active cardiopulmonary disease. Electronically Signed   By: Rolm Baptise M.D.   On: 11/02/2016 08:10    EKG: Independently reviewed. Normal sinus rhythm Nonspecific ST abnormality Abnormal ECG When compared to prior, slightly faster rate. No STEMI   Assessment/Plan Principal Problem:   Dyspnea Active Problems:   HTN (hypertension)   DM (diabetes mellitus), type 2 with renal complications (HCC)   Anemia in chronic renal disease   Edema   Obesity   Thrombocytopenia (HCC)   Pancytopenia (HCC)   ESRD on dialysis ([place])   HLD (hyperlipidemia)   Obesity hypoventilation syndrome (HCC)   Fatigue   Clostridium difficile enteritis   #1. Dyspnea. Likely related to increased volume secondary to delayed dialysis in a patient with a history of chronic diastolic heart failure and COPD. Patient is febrile the chest x-ray reveals no active cardiopulmonary disease. Lactic acid within the limits of normal. He is hemodynamically stable. No lower extremity edema. Nephrology contacted by ED  provider patient starting dialysis at time of admission -Admit to telemetry -Dialyze per nephrology's recommendation -Daily weights -Continue home meds -Scheduled nebulizers -Oxygen supplementation as indicated -Antitussives -Wean oxygen as able  #2. Fever. Unknown etiology. Recent hospitalization/treatment for pneumonia and C. difficile. Continues with cough as well as diarrhea. Home medications include oral vancomycin. Blood cultures obtained in ED.  -Follow blood cultures -Sputum culture as able -Follow influenza panel -Tylenol when necessary -continue vancomycin and add zosyn  #3. C. difficile. Stool tested positive October 8. Started on oral vancomycin October 10. Continues with intermittent diarrhea. -continue oral vancomycin -Of note patient was given a second prescription for vancomycin yesterday  #4. Hypertension. Fair control in the emergency department. -Continue home meds  #5. Diabetes. Diet controlled. Serum glucose 96 on admission. -Carb modified diet  #6. Thrombocytopenia. Platelets 73. Chart review indicates recent history of same. Presumably from infectious process. No signs symptoms of active bleeding. -Monitor  #7 Chronic diastolic heart failure. Recent echo with an EF of 55-60% and  mild LVH. -Continue aspirin and beta blocker and statin -Volume control with dialysis as noted above.  #8 Pancytopenia. Recent hx of same. See above -follow cultures   DVT prophylaxis: scd  Code Status: full  Family Communication: none present  Disposition Plan: home   Consults called:  [place] [facility]rology Admission status: inpatient    Radene Gunning MD [facility]  If 7PM-7AM, please contact night-coverage [email or web address] Password [email or web address]  11/02/2016, 3:53 PM

## 2016-11-02 NOTE — ED Notes (Addendum)
Nephrology Provider at bedside. Made aware of patient's pain, advise this RN she would place an order for pain meds for patient. Also advised pt is going to dialysis but that it may be a little while before they are ready for him.

## 2016-11-02 NOTE — ED Triage Notes (Signed)
Wife states patient started feeling bad last tues. Was seen in the ed several days ago per wife and was dx. With c-diff. Started running a fever yest. Was suppose to go to dialysis today however  Dialysis center is closed due to the weather of having no power. C/o chest pain today

## 2016-11-02 NOTE — ED Notes (Signed)
Dr. Sherry Ruffing made aware of patient's request for pain meds for h/a.

## 2016-11-02 NOTE — ED Notes (Signed)
Patient transported to X-ray 

## 2016-11-02 NOTE — ED Notes (Signed)
ED Provider at bedside. 

## 2016-11-02 NOTE — ED Notes (Signed)
Pt still requesting something for pain, Dr. Sherry Ruffing made aware.
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
## 2016-11-03 DIAGNOSIS — R509 Fever, unspecified: Secondary | ICD-10-CM

## 2016-11-03 DIAGNOSIS — R0600 Dyspnea, unspecified: Secondary | ICD-10-CM

## 2016-11-03 DIAGNOSIS — D61818 Other pancytopenia: Secondary | ICD-10-CM

## 2016-11-03 DIAGNOSIS — Z992 Dependence on renal dialysis: Secondary | ICD-10-CM

## 2016-11-03 DIAGNOSIS — N186 End stage renal disease: Secondary | ICD-10-CM

## 2016-11-03 DIAGNOSIS — I10 Essential (primary) hypertension: Secondary | ICD-10-CM

## 2016-11-03 LAB — GLUCOSE, CAPILLARY
GLUCOSE-CAPILLARY: 102 mg/dL — AB (ref 65–99)
GLUCOSE-CAPILLARY: 95 mg/dL (ref 65–99)
Glucose-Capillary: 131 mg/dL — ABNORMAL HIGH (ref 65–99)

## 2016-11-03 LAB — BASIC METABOLIC PANEL
ANION GAP: 16 — AB (ref 5–15)
BUN: 40 mg/dL — ABNORMAL HIGH (ref 6–20)
CALCIUM: 8 mg/dL — AB (ref 8.9–10.3)
CO2: 24 mmol/L (ref 22–32)
CREATININE: 11.19 mg/dL — AB (ref 0.61–1.24)
Chloride: 95 mmol/L — ABNORMAL LOW (ref 101–111)
GFR calc non Af Amer: 5 mL/min — ABNORMAL LOW (ref >60)
GFR, EST AFRICAN AMERICAN: 5 mL/min — AB (ref >60)
Glucose, Bld: 99 mg/dL (ref 65–99)
Potassium: 4 mmol/L (ref 3.5–5.1)
SODIUM: 135 mmol/L (ref 135–145)

## 2016-11-03 LAB — CBC
HCT: 38.2 % — ABNORMAL LOW (ref 39.0–52.0)
HEMOGLOBIN: 11.9 g/dL — AB (ref 13.0–17.0)
MCH: 30.1 pg (ref 26.0–34.0)
MCHC: 31.2 g/dL (ref 30.0–36.0)
MCV: 96.7 fL (ref 78.0–100.0)
PLATELETS: 67 10*3/uL — AB (ref 150–400)
RBC: 3.95 MIL/uL — AB (ref 4.22–5.81)
RDW: 14.7 % (ref 11.5–15.5)
WBC: 3.1 10*3/uL — AB (ref 4.0–10.5)

## 2016-11-03 MED ORDER — IPRATROPIUM-ALBUTEROL 0.5-2.5 (3) MG/3ML IN SOLN
3.0000 mL | Freq: Four times a day (QID) | RESPIRATORY_TRACT | Status: DC | PRN
  Administered 2016-11-05 – 2016-11-06 (×4): 3 mL via RESPIRATORY_TRACT
  Filled 2016-11-03: qty 3

## 2016-11-03 MED ORDER — ORAL CARE MOUTH RINSE
15.0000 mL | Freq: Two times a day (BID) | OROMUCOSAL | Status: DC
  Administered 2016-11-05 – 2016-11-10 (×7): 15 mL via OROMUCOSAL

## 2016-11-03 MED ORDER — PRO-STAT SUGAR FREE PO LIQD
30.0000 mL | Freq: Two times a day (BID) | ORAL | Status: DC
  Administered 2016-11-03 – 2016-11-11 (×11): 30 mL via ORAL
  Filled 2016-11-03 (×13): qty 30

## 2016-11-03 MED ORDER — IPRATROPIUM-ALBUTEROL 0.5-2.5 (3) MG/3ML IN SOLN
3.0000 mL | Freq: Two times a day (BID) | RESPIRATORY_TRACT | Status: DC
  Administered 2016-11-04 – 2016-11-06 (×3): 3 mL via RESPIRATORY_TRACT
  Filled 2016-11-03 (×5): qty 3
  Filled 2016-11-03: qty 9

## 2016-11-03 MED ORDER — PIPERACILLIN-TAZOBACTAM 3.375 G IVPB
3.3750 g | Freq: Two times a day (BID) | INTRAVENOUS | Status: DC
  Administered 2016-11-03 – 2016-11-07 (×8): 3.375 g via INTRAVENOUS
  Filled 2016-11-03 (×10): qty 50

## 2016-11-03 NOTE — Progress Notes (Signed)
Patient ID: Kirk [name], male   DOB: 02-Dec-1960, 56 y.o.   MRN: [id number]  PROGRESS NOTE    DASON MOSLEY  HDQ:[id number] DOB: 01/25/1960 DOA: 11/02/2016 PCP: Gayland Curry, DO   Brief Narrative:  56 year old male with history of ESRD on dialysis, hypertension, obstructive sleep apnea, COPD, diastolic CHF, GERD, chronic headache, pneumonia and recent hospitalization to [facility] and discharge on 11/01/2016 for C. difficile colitis treated with oral vancomycin. He presented on 11/02/2016 with fever and shortness of breath. He was admitted and started on broad-spectrum antibiotics.   Assessment & Plan:   Principal Problem:   Dyspnea Active Problems:   HTN (hypertension)   DM (diabetes mellitus), type 2 with renal complications (HCC)   Anemia in chronic renal disease   Edema   Obesity   Thrombocytopenia (HCC)   Pancytopenia (HCC)   ESRD on dialysis ([place])   HLD (hyperlipidemia)   Obesity hypoventilation syndrome (HCC)   Fatigue   Clostridium difficile enteritis  Fever. Unknown etiology - Continue broad-spectrum antibiotics for now. Follow cultures. Patient is still having fever. If patient continues to spike fevers, we will have to do further imaging studies including CAT scan of the chest abdomen pelvis -No clear-cut source of far; can be from C. difficile colitis as well as -Sputum culture as able -Flu A and B are negative  - Repeat a.m. labs  Dyspnea - Probably secondary to volume overload. Status post dialysis yesterday.  - Improving. - Oxygen supplementation as needed  Recent diagnosis of C. difficile colitis : Stool tested positive on 10/26/2016. Started on oral vancomycin October 10. Continues with intermittent diarrhea. -continue oral vancomycin   Hypertension -Monitor blood pressure. Continue amlodipine, hydralazine and metoprolol  Diabetes. Diet controlled. -Carb modified diet  Thrombocytopenia.  - Presumably from infectious process. No  signs symptoms of active bleeding. -Monitor  Chronic diastolic heart failure. Recent echo on 09/19/2016 with an EF of 55-60% and mild LVH. -Continue aspirin and beta blocker and statin -Volume control with dialysis as noted above.  Pancytopenia. Recent hx of same.  -follow cultures. Monitor   DVT prophylaxis: SCDs Code Status:   Full Family Communication: None at bedside Disposition Plan: Home in 1-3 days  Consultants: Nephrology  Procedures: None  Antimicrobials: Vancomycin IV from 11/02/2016 Zosyn from 11/03/2016 Oral vancomycin is being continued   Subjective: Patient seen and examined at bedside. He still feels short of breath and is coughing. He thinks his diarrhea is getting better. No overnight vomiting or abdominal pain  Objective: Vitals:   11/03/16 0629 11/03/16 0650 11/03/16 0736 11/03/16 0900  BP: (!) 160/74   (!) 151/65  Pulse: 78   94  Resp: 18   18  Temp: 100.1 F (37.8 C)   (!) 101.2 F (38.4 C)  TempSrc: Oral   Oral  SpO2: 90% 95% 98% 96%  Weight: 97.3 kg (214 lb 8.1 oz)     Height:        Intake/Output Summary (Last 24 hours) at 11/03/16 1136 Last data filed at 11/03/16 0647  Gross per 24 hour  Intake              120 ml  Output             2289 ml  Net            -2169 ml   Filed Weights   11/02/16 1806 11/02/16 1942 11/03/16 0629  Weight: 97 kg (213 lb 13.5 oz) 96.4 kg (212  lb 8.4 oz) 97.3 kg (214 lb 8.1 oz)    Examination:  General exam: Appears calm and comfortable  Respiratory system: Bilateral decreased breath sound at bases With scattered crackles Cardiovascular system: S1 & S2 heard, rate controlled Gastrointestinal system: Abdomen is nondistended, soft and nontender. Normal bowel sounds heard. Extremities: No cyanosis, clubbing; trace edema    Data Reviewed: I have personally reviewed following labs and imaging studies  CBC:  Recent Labs Lab 10/31/16 0846 11/01/16 0412 11/02/16 0840 11/03/16 0437  WBC 3.2* 3.9*  3.4* 3.1*  NEUTROABS 2.1  --  2.1  --   HGB 12.0* 12.7* 11.8* 11.9*  HCT 37.2* 39.4 36.8* 38.2*  MCV 98.2 96.3 94.8 96.7  PLT 73* 89* 73* 67*   Basic Metabolic Panel:  Recent Labs Lab 10/31/16 0846 11/01/16 0412 11/02/16 0840 11/03/16 0437  NA 138 138 137 135  K 3.8 4.5 4.2 4.0  CL 100* 93* 96* 95*  CO2 28 30 25 24   GLUCOSE 123* 124* 96 99  BUN 29* 36* 57* 40*  CREATININE 9.00* 8.24* 13.07* 11.19*  CALCIUM 8.2* 9.2 8.5* 8.0*  PHOS  --  4.3  --   --    GFR: Estimated Creatinine Clearance: 8.4 mL/min (A) (by C-G formula based on SCr of 11.19 mg/dL (H)). Liver Function Tests:  Recent Labs Lab 10/31/16 0846 11/02/16 0840  AST 23 27  ALT 19 19  ALKPHOS 93 88  BILITOT 0.7 0.9  PROT 6.9 6.3*  ALBUMIN 3.7 3.5    Recent Labs Lab 11/02/16 0801  LIPASE 74*   No results for input(s): AMMONIA in the last 168 hours. Coagulation Profile:  Recent Labs Lab 11/02/16 0840  INR 1.06   Cardiac Enzymes:  Recent Labs Lab 10/31/16 0846  TROPONINI <0.03   BNP (last 3 results) No results for input(s): PROBNP in the last 8760 hours. HbA1C:  Recent Labs  11/02/16 0840  HGBA1C 5.4   CBG:  Recent Labs Lab 11/01/16 0358 11/01/16 0734 11/01/16 1120 11/02/16 2137 11/03/16 0719  GLUCAP 119* 105* 141* 131* 102*   Lipid Profile: No results for input(s): CHOL, HDL, LDLCALC, TRIG, CHOLHDL, LDLDIRECT in the last 72 hours. Thyroid Function Tests: No results for input(s): TSH, T4TOTAL, FREET4, T3FREE, THYROIDAB in the last 72 hours. Anemia Panel: No results for input(s): VITAMINB12, FOLATE, FERRITIN, TIBC, IRON, RETICCTPCT in the last 72 hours. Sepsis Labs:  Recent Labs Lab 11/02/16 0641 11/02/16 0908  LATICACIDVEN 1.66 1.15    Recent Results (from the past 240 hour(s))  Urine culture     Status: Abnormal   Collection Time: 10/26/16  5:00 PM  Result Value Ref Range Status   Specimen Description URINE, RANDOM  Final   Special Requests NONE  Final   Culture  MULTIPLE SPECIES PRESENT, SUGGEST RECOLLECTION (A)  Final   Report Status 10/28/2016 FINAL  Final  Gastrointestinal Panel by PCR , Stool     Status: None   Collection Time: 10/26/16  8:21 PM  Result Value Ref Range Status   Campylobacter species NOT DETECTED NOT DETECTED Final   Plesimonas shigelloides NOT DETECTED NOT DETECTED Final   Salmonella species NOT DETECTED NOT DETECTED Final   Yersinia enterocolitica NOT DETECTED NOT DETECTED Final   Vibrio species NOT DETECTED NOT DETECTED Final   Vibrio cholerae NOT DETECTED NOT DETECTED Final   Enteroaggregative E coli (EAEC) NOT DETECTED NOT DETECTED Final   Enteropathogenic E coli (EPEC) NOT DETECTED NOT DETECTED Final   Enterotoxigenic E coli (ETEC) NOT  DETECTED NOT DETECTED Final   Shiga like toxin producing E coli (STEC) NOT DETECTED NOT DETECTED Final   Shigella/Enteroinvasive E coli (EIEC) NOT DETECTED NOT DETECTED Final   Cryptosporidium NOT DETECTED NOT DETECTED Final   Cyclospora cayetanensis NOT DETECTED NOT DETECTED Final   Entamoeba histolytica NOT DETECTED NOT DETECTED Final   Giardia lamblia NOT DETECTED NOT DETECTED Final   Adenovirus F40/41 NOT DETECTED NOT DETECTED Final   Astrovirus NOT DETECTED NOT DETECTED Final   Norovirus GI/GII NOT DETECTED NOT DETECTED Final   Rotavirus A NOT DETECTED NOT DETECTED Final   Sapovirus (I, II, IV, and V) NOT DETECTED NOT DETECTED Final  C difficile quick scan w PCR reflex     Status: Abnormal   Collection Time: 10/26/16  8:21 PM  Result Value Ref Range Status   C Diff antigen POSITIVE (A) NEGATIVE Final   C Diff toxin NEGATIVE NEGATIVE Final   C Diff interpretation Results are indeterminate. See PCR results.  Final  Clostridium Difficile by PCR     Status: Abnormal   Collection Time: 10/26/16  8:21 PM  Result Value Ref Range Status   Toxigenic C Difficile by pcr POSITIVE (A) NEGATIVE Final    Comment: Positive for toxigenic C. difficile with little to no toxin production. Only  treat if clinical presentation suggests symptomatic illness.  MRSA PCR Screening     Status: None   Collection Time: 10/31/16  1:46 PM  Result Value Ref Range Status   MRSA by PCR NEGATIVE NEGATIVE Final    Comment:        The GeneXpert MRSA Assay (FDA approved for NASAL specimens only), is one component of a comprehensive MRSA colonization surveillance program. It is not intended to diagnose MRSA infection nor to guide or monitor treatment for MRSA infections.   MRSA PCR Screening     Status: None   Collection Time: 11/02/16  9:40 PM  Result Value Ref Range Status   MRSA by PCR NEGATIVE NEGATIVE Final    Comment:        The GeneXpert MRSA Assay (FDA approved for NASAL specimens only), is one component of a comprehensive MRSA colonization surveillance program. It is not intended to diagnose MRSA infection nor to guide or monitor treatment for MRSA infections.          Radiology Studies: Dg Chest 2 View  Result Date: 11/02/2016 CLINICAL DATA:  Fever, chest pain, weakness EXAM: CHEST  2 VIEW COMPARISON:  10/31/2016 FINDINGS: Heart and mediastinal contours are within normal limits. No focal opacities or effusions. No acute bony abnormality. IMPRESSION: No active cardiopulmonary disease. Electronically Signed   By: Rolm Baptise M.D.   On: 11/02/2016 08:10        Scheduled Meds: . acetaminophen  1,000 mg Oral Once  . amLODipine  10 mg Oral Daily  . aspirin EC  81 mg Oral Daily  . [START ON 11/05/2016] calcitRIOL  0.25 mcg Oral Q M,W,F-HD  . calcitRIOL  1.25 mcg Oral Once per day on Mon Wed Fri  . cholecalciferol  1,000 Units Oral Daily  . clonazePAM  0.5 mg Oral BID  . guaiFENesin  600 mg Oral BID  . hydrALAZINE  50 mg Oral Q8H  . insulin aspart  0-9 Units Subcutaneous TID WC  . ipratropium-albuterol  3 mL Nebulization TID  . mouth rinse  15 mL Mouth Rinse BID  . metoprolol tartrate  75 mg Oral BID  . mometasone-formoterol  2 puff Inhalation BID  .  multivitamin  1 tablet Oral QHS  . pravastatin  40 mg Oral QPM  . sevelamer carbonate  2,400 mg Oral TID WC  . vancomycin  125 mg Oral Q6H   Continuous Infusions: . piperacillin-tazobactam (ZOSYN)  IV 3.375 g (11/03/16 1006)     LOS: 1 day        Aline August, MD [facility] Pager [phone]  If 7PM-7AM, please contact night-coverage [email or web address] Password [email or web address] 11/03/2016, 11:36 AM

## 2016-11-03 NOTE — Progress Notes (Signed)
[facility] Progress Note  Subjective: no + tests so far, blood cx's are pending, still having chills/ sweats, fevers. No CP or abd pain, no n/v/d.    Vitals:   11/03/16 0629 11/03/16 0650 11/03/16 0736 11/03/16 0900  BP: (!) 160/74   (!) 151/65  Pulse: 78   94  Resp: 18   18  Temp: 100.1 F (37.8 C)   (!) 101.2 F (38.4 C)  TempSrc: Oral   Oral  SpO2: 90% 95% 98% 96%  Weight: 97.3 kg (214 lb 8.1 oz)     Height:        Inpatient medications: . acetaminophen  1,000 mg Oral Once  . amLODipine  10 mg Oral Daily  . aspirin EC  81 mg Oral Daily  . [START ON 11/05/2016] calcitRIOL  0.25 mcg Oral Q M,W,F-HD  . calcitRIOL  1.25 mcg Oral Once per day on Mon Wed Fri  . cholecalciferol  1,000 Units Oral Daily  . clonazePAM  0.5 mg Oral BID  . guaiFENesin  600 mg Oral BID  . hydrALAZINE  50 mg Oral Q8H  . insulin aspart  0-9 Units Subcutaneous TID WC  . ipratropium-albuterol  3 mL Nebulization TID  . mouth rinse  15 mL Mouth Rinse BID  . metoprolol tartrate  75 mg Oral BID  . mometasone-formoterol  2 puff Inhalation BID  . multivitamin  1 tablet Oral QHS  . pravastatin  40 mg Oral QPM  . sevelamer carbonate  2,400 mg Oral TID WC  . vancomycin  125 mg Oral Q6H   . piperacillin-tazobactam (ZOSYN)  IV 3.375 g (11/03/16 1006)   acetaminophen **OR** acetaminophen, diphenhydrAMINE, guaiFENesin-dextromethorphan, HYDROcodone-acetaminophen, metoCLOPramide, ondansetron **OR** ondansetron (ZOFRAN) IV, senna-docusate, traMADol  Exam: Alert, no distress, not toxic in appearance  no jvd Chest clear bilat RRR no mrg Abd soft ntnd no ascites Ext 1+ leg edema NF, ox 3 LFA AVF +thrill  CXR - no active disease   Dialysis: MWF [facility] ([facility]) 4h 15min  100kg  2/2  Hep 4000  LFA AVF - Calcitriol 1.54mcg PO q HD - Venofer 50mg  IV weekly - BMM meds: Phoslo 4/meals      Impression: 1. Fevers - blood cx's pend. Hx recent Cdif, vs UTI, vs viral vs other.  Getting IV  vanc/ zosyn now.  2. SOB - will lower edw with HD. CXR was neg but BP's are high 3. HTN - continues on home BP meds (MTP/ norvasc/ hydral) 4. Vol lowering dry wt, 2kg under today 5. Anemia - Hb 11, no need esa for nwo 6. MBD cont meds 7. COPD   Plan - HD Monday if still here   Kelly Splinter MD [facility] pager [phone]   11/03/2016, 11:17 AM    Recent Labs Lab 11/01/16 0412 11/02/16 0840 11/03/16 0437  NA 138 137 135  K 4.5 4.2 4.0  CL 93* 96* 95*  CO2 30 25 24   GLUCOSE 124* 96 99  BUN 36* 57* 40*  CREATININE 8.24* 13.07* 11.19*  CALCIUM 9.2 8.5* 8.0*  PHOS 4.3  --   --     Recent Labs Lab 10/31/16 0846 11/02/16 0840  AST 23 27  ALT 19 19  ALKPHOS 93 88  BILITOT 0.7 0.9  PROT 6.9 6.3*  ALBUMIN 3.7 3.5    Recent Labs Lab 10/31/16 0846 11/01/16 0412 11/02/16 0840 11/03/16 0437  WBC 3.2* 3.9* 3.4* 3.1*  NEUTROABS 2.1  --  2.1  --   HGB  12.0* 12.7* 11.8* 11.9*  HCT 37.2* 39.4 36.8* 38.2*  MCV 98.2 96.3 94.8 96.7  PLT 73* 89* 73* 67*   Iron/TIBC/Ferritin/ %Sat    Component Value Date/Time   IRON 90 01/19/2010 1016   TIBC 225 01/19/2010 1016   FERRITIN 318 01/19/2010 1016   IRONPCTSAT 40 01/19/2010 1016

## 2016-11-03 NOTE — Progress Notes (Signed)
Initial Nutrition Assessment  DOCUMENTATION CODES:  Not applicable  INTERVENTION:  Will order 30 mL Prostat BID, each supplement provides 100 kcal and 15 grams of protein.  Magic cup BID with meals, each supplement provides 290 kcal and 9 grams of protein  Jello with meals  NUTRITION DIAGNOSIS:  Inadequate oral intake related to nausea, vomiting, poor appetite, acute illness (Cdiff) as evidenced by per patient report and loss of 2% bw in 3 days  GOAL:  Patient will meet greater than or equal to 90% of their needs  MONITOR:  PO intake, Supplement acceptance, Labs, Weight trends  REASON FOR ASSESSMENT:  Malnutrition Screening Tool    ASSESSMENT:  56 y/o male PMHx ESRD on HD, OSA, COPD, HF, HTN, GERD. Had just been admitted to [facility] 10/10-10/11 for Acute respiratory failure due to pulmonary edema and Cdiff. Represents to [facility] with worsening SOB, fatigue and cough. Found to be fluid overloaded and febrile. Had not received HD due to outpatient center being w/o power. Admitted for management  Pt is still feeling very poor and, as such, was very short spoken.   He says he has had ongoing n/v and poor appetite for roughly a week, though likely longer. He says he still has been eating somewhat and says he ate while at [facility]. Did not give any real quantities.  At baseline, he does not drink supplements.   He says his diarrhea has not improved since being on abx. He has nausea. During conversation he was having chest pain that caused him to lean forward. He says this "comes and goes" and is a regular occurence  He says his typical dry weight is 100 kg, but since he developed his diarrhea, it has dropped. Patient had received HD at [facility] on 10/10. After he was weighing 219 lbs (99.3 kg). His current weight reflects a further loss of 4.5 lbs, despite being clinically fluid overloaded and not recently having HD.   Physical Exam: WDL  Discussed importance of nutrition despite not having a good  appetite. He was agreeable to prostat. Took food requests. He thought jello/ice cream sounded good. Given minimal intake at this time anyway, feel a magic cup is appropriate.   Labs: K/Phos: WDL, Albumin 3.5, BGs 102-135, a1c: 5.4 Meds: Vit D, Klonopin, Calcitriol, Rena-vite, oral abx, iv abx,    Recent Labs Lab 11/01/16 0412 11/02/16 0840 11/03/16 0437  NA 138 137 135  K 4.5 4.2 4.0  CL 93* 96* 95*  CO2 30 25 24   BUN 36* 57* 40*  CREATININE 8.24* 13.07* 11.19*  CALCIUM 9.2 8.5* 8.0*  PHOS 4.3  --   --   GLUCOSE 124* 96 99   Diet Order:  Diet heart healthy/carb modified Room service appropriate? Yes; Fluid consistency: Thin  Skin:  Reviewed, no issues  Last BM:  10/12-diarrhea  Height:  Ht Readings from Last 1 Encounters:  11/02/16 5\' 7"  (1.702 m)   Weight:  Wt Readings from Last 1 Encounters:  11/03/16 214 lb 8.1 oz (97.3 kg)   Wt Readings from Last 10 Encounters:  11/03/16 214 lb 8.1 oz (97.3 kg)  11/01/16 218 lb 14.7 oz (99.3 kg)  10/26/16 224 lb (101.6 kg)  10/08/16 224 lb (101.6 kg)  09/22/16 221 lb 6.4 oz (100.4 kg)  08/29/16 227 lb (103 kg)  08/15/16 227 lb (103 kg)  07/03/16 227 lb (103 kg)  06/21/16 221 lb 9.6 oz (100.5 kg)  02/15/16 224 lb 12.8 oz (102 kg)   Ideal  Body Weight:  67.27 kg  BMI:  Body mass index is 33.6 kg/m.  SBW for age/height= 71 kg Estimated Nutritional Needs:  Kcal:  2150-2350 kcals (30-33 kcal/kg sbw) Protein:  92-105g pro (1.3-1.5 g/kg bw) Fluid:  1.2 Liters or Per MD  EDUCATION NEEDS:  No education needs identified at this time  Burtis Junes RD, LDN, [place] [place] [facility] Nutrition Pager: [phone] 11/03/2016 1:34 PM

## 2016-11-03 NOTE — Progress Notes (Signed)
Pharmacy Antibiotic Note  Kirk [name] is a 56 y.o. male admitted on 11/02/2016 with fever.  Patient is on hemodialysis MWF, tolerated last session 10/12. He received 1 dose of vancomycin post-HD for bacteremia coverage. Of note patient continues on PO vancomycin for Cdiff treatment. Pharmacy has been consulted for Zosyn dosing in setting of continued fever.  Plan: Initiate zosyn 3.375 g IV (4 hour infusion) q 12 hrs Monitor hemodialysis tolerance, culture results, de-escalation plan and clinical status  Height: 5\' 7"  (170.2 cm) Weight: 214 lb 8.1 oz (97.3 kg) IBW/kg (Calculated) : 66.1  Temp (24hrs), Avg:100.8 F (38.2 C), Min:99.6 F (37.6 C), Max:102.4 F (39.1 C)   Recent Labs Lab 10/31/16 0846 11/01/16 0412 11/02/16 0641 11/02/16 0840 11/02/16 0908 11/03/16 0437  WBC 3.2* 3.9*  --  3.4*  --  3.1*  CREATININE 9.00* 8.24*  --  13.07*  --  11.19*  LATICACIDVEN  --   --  1.66  --  1.15  --     Estimated Creatinine Clearance: 8.4 mL/min (A) (by C-G formula based on SCr of 11.19 mg/dL (H)).    Allergies  Allergen Reactions  . Codeine Nausea And Vomiting and Nausea Only    Altered mental status    Antimicrobials this admission: Vancomycin 1500 mg x 1 >>10/12 post-HD  Microbiology results:  10/12 MRSA PCR - neg  10/12 blood x 2 - ngtd  10/12 HD fistula - ngtd  10/12 Influenza PCR neg  Thank you for allowing pharmacy to be a part of this patient's care.   Charlene Brooke, PharmD PGY1 Pharmacy Resident Phone: [phone] After 3:30PM please call [place] [phone] 11/03/2016 8:51 AM

## 2016-11-03 NOTE — Progress Notes (Signed)
Pt places self on/off cpap. RT will monitor. 

## 2016-11-04 ENCOUNTER — Inpatient Hospital Stay (HOSPITAL_COMMUNITY): Payer: Medicare Other

## 2016-11-04 DIAGNOSIS — E1121 Type 2 diabetes mellitus with diabetic nephropathy: Secondary | ICD-10-CM

## 2016-11-04 DIAGNOSIS — J189 Pneumonia, unspecified organism: Secondary | ICD-10-CM

## 2016-11-04 DIAGNOSIS — D631 Anemia in chronic kidney disease: Secondary | ICD-10-CM

## 2016-11-04 LAB — VANCOMYCIN, RANDOM: Vancomycin Rm: 22

## 2016-11-04 LAB — COMPREHENSIVE METABOLIC PANEL
ALBUMIN: 3.3 g/dL — AB (ref 3.5–5.0)
ALT: 22 U/L (ref 17–63)
AST: 26 U/L (ref 15–41)
Alkaline Phosphatase: 79 U/L (ref 38–126)
Anion gap: 16 — ABNORMAL HIGH (ref 5–15)
BUN: 63 mg/dL — AB (ref 6–20)
CHLORIDE: 95 mmol/L — AB (ref 101–111)
CO2: 25 mmol/L (ref 22–32)
CREATININE: 15.35 mg/dL — AB (ref 0.61–1.24)
Calcium: 7.9 mg/dL — ABNORMAL LOW (ref 8.9–10.3)
GFR calc Af Amer: 4 mL/min — ABNORMAL LOW (ref >60)
GFR calc non Af Amer: 3 mL/min — ABNORMAL LOW (ref >60)
GLUCOSE: 105 mg/dL — AB (ref 65–99)
POTASSIUM: 4.2 mmol/L (ref 3.5–5.1)
SODIUM: 136 mmol/L (ref 135–145)
TOTAL PROTEIN: 6.9 g/dL (ref 6.5–8.1)
Total Bilirubin: 1.1 mg/dL (ref 0.3–1.2)

## 2016-11-04 LAB — GLUCOSE, CAPILLARY
GLUCOSE-CAPILLARY: 161 mg/dL — AB (ref 65–99)
GLUCOSE-CAPILLARY: 96 mg/dL (ref 65–99)
GLUCOSE-CAPILLARY: 123 mg/dL — AB (ref 65–99)
GLUCOSE-CAPILLARY: 121 mg/dL — AB (ref 65–99)
Glucose-Capillary: 137 mg/dL — ABNORMAL HIGH (ref 65–99)

## 2016-11-04 LAB — CBC WITH DIFFERENTIAL/PLATELET
BASOS ABS: 0 10*3/uL (ref 0.0–0.1)
Basophils Relative: 1 %
EOS PCT: 0 %
Eosinophils Absolute: 0 10*3/uL (ref 0.0–0.7)
HCT: 35.8 % — ABNORMAL LOW (ref 39.0–52.0)
Hemoglobin: 11.7 g/dL — ABNORMAL LOW (ref 13.0–17.0)
LYMPHS ABS: 1.1 10*3/uL (ref 0.7–4.0)
Lymphocytes Relative: 24 %
MCH: 31.3 pg (ref 26.0–34.0)
MCHC: 32.7 g/dL (ref 30.0–36.0)
MCV: 95.7 fL (ref 78.0–100.0)
MONO ABS: 0.5 10*3/uL (ref 0.1–1.0)
Monocytes Relative: 11 %
NEUTROS PCT: 64 %
Neutro Abs: 3 10*3/uL (ref 1.7–7.7)
PLATELETS: 65 10*3/uL — AB (ref 150–400)
RBC: 3.74 MIL/uL — AB (ref 4.22–5.81)
RDW: 14.8 % (ref 11.5–15.5)
WBC: 4.6 10*3/uL (ref 4.0–10.5)

## 2016-11-04 LAB — MAGNESIUM: MAGNESIUM: 2.1 mg/dL (ref 1.7–2.4)

## 2016-11-04 MED ORDER — FAMOTIDINE 20 MG PO TABS
20.0000 mg | ORAL_TABLET | ORAL | Status: DC
  Administered 2016-11-04 – 2016-11-10 (×5): 20 mg via ORAL
  Filled 2016-11-04 (×5): qty 1

## 2016-11-04 MED ORDER — VANCOMYCIN HCL IN DEXTROSE 1-5 GM/200ML-% IV SOLN
1000.0000 mg | INTRAVENOUS | Status: DC
  Administered 2016-11-05: 1000 mg via INTRAVENOUS
  Filled 2016-11-04: qty 200

## 2016-11-04 MED ORDER — ALUM & MAG HYDROXIDE-SIMETH 200-200-20 MG/5ML PO SUSP: 15.0000 mL | Freq: Once | ORAL | Status: DC

## 2016-11-04 MED ORDER — METOCLOPRAMIDE HCL 10 MG PO TABS
10.0000 mg | ORAL_TABLET | Freq: Once | ORAL | Status: AC | PRN
  Administered 2016-11-04: 10 mg via ORAL
  Filled 2016-11-04: qty 1

## 2016-11-04 NOTE — Progress Notes (Addendum)
Pharmacy Antibiotic Note  Kirk [name] is a 56 y.o. male admitted on 11/02/2016 with pneumonia.  Pharmacy has been consulted for vancomycin dosing. Patient is on hemodialysis MWF, tolerated last session 10/12. Of note patient continues on PO vancomycin for Cdiff treatment.  Patient remains febrile despite broad spectrum coverage with zosyn and vancomycin (1 loading dose given post-HD 10/12). Random vancomycin level 22 within goal range for pre-HD.   Plan: Vancomycin 1000 mg IV post-HD (next dose Monday) Zosyn 3.375 gram IV q12 hours Check vancomycin level as indicated. Pre-HD goal 15-25. Monitor culture results, clinical status, and length of therapy/de-escalation plan Monitor hemodialysis schedule/tolerance  Height: 5\' 7"  (170.2 cm) Weight: 214 lb 11.7 oz (97.4 kg) IBW/kg (Calculated) : 66.1  Temp (24hrs), Avg:100 F (37.8 C), Min:98.9 F (37.2 C), Max:101.6 F (38.7 C)   Recent Labs Lab 10/31/16 0846 11/01/16 0412 11/02/16 0641 11/02/16 0840 11/02/16 0908 11/03/16 0437 11/04/16 0420 11/04/16 0923  WBC 3.2* 3.9*  --  3.4*  --  3.1* 4.6  --   CREATININE 9.00* 8.24*  --  13.07*  --  11.19* 15.35*  --   LATICACIDVEN  --   --  1.66  --  1.15  --   --   --   VANCORANDOM  --   --   --   --   --   --   --  22    Estimated Creatinine Clearance: 6.1 mL/min (A) (by C-G formula based on SCr of 15.35 mg/dL (H)).    Allergies  Allergen Reactions  . Codeine Nausea And Vomiting and Nausea Only    Altered mental status    Antimicrobials this admission:  Vanc 1500 mg IV x 1 on 10/12 pm post-HD Vanc PO 10/11>>(10/25) Zosyn IV 10/13>>   Microbiology results:  10/12 MRSA PCR - neg  10/12 blood x 2 - ngtd  10/12 HD fistula - ngtd  10/12 Influenza PCR neg  Thank you for allowing pharmacy to be a part of this patient's care.   Charlene Brooke, PharmD PGY1 Pharmacy Resident Phone: [phone] After 3:30PM please call [place] [phone] 11/04/2016 11:09 AM

## 2016-11-04 NOTE — Progress Notes (Signed)
Patient and wife concerned that patient's abdomen more distended than normal.  Dr. Starla Link paged.  Will continue to monitor.

## 2016-11-04 NOTE — Progress Notes (Signed)
Patient is still having fever. Tylenol given and effective.  Patient stated that he feels better this morning, however he is still weak.  No other complaints or concerns during the night.  Will continue to monitor.  Quintrell Baze, RN

## 2016-11-04 NOTE — Progress Notes (Signed)
Patient ID: Kirk [name], male   DOB: Nov 24, 1960, 56 y.o.   MRN: [id number]  PROGRESS NOTE    KURON DOCKEN  XBJ:[id number] DOB: [birth year] DOA: 11/02/2016 PCP: Gayland Curry, DO   Brief Narrative:  56 year old male with history of ESRD on dialysis, hypertension, obstructive sleep apnea, COPD, diastolic CHF, GERD, chronic headache, pneumonia and recent hospitalization to [facility] and discharge on 11/01/2016 for C. difficile colitis treated with oral vancomycin. He presented on 11/02/2016 with fever and shortness of breath. He was admitted and started on broad-spectrum antibiotics.   Assessment & Plan:   Principal Problem:   Dyspnea Active Problems:   HTN (hypertension)   DM (diabetes mellitus), type 2 with renal complications (HCC)   Anemia in chronic renal disease   Edema   Obesity   Thrombocytopenia (HCC)   Pancytopenia (HCC)   ESRD on dialysis ([place])   HLD (hyperlipidemia)   Obesity hypoventilation syndrome (HCC)   Fatigue   Clostridium difficile enteritis  Fever probably from pneumonia - continue vancomycin and Zosyn for now. Follow cultures. Patient is still having fevers but improving -Flu A and B are negative  - Repeat a.m. Labs  Probable healthcare associated pneumonia with concern for gram-negative rod/MRSA - continue vancomycin and Zosyn for now. Urine Legionella and streptococcal antigen if the patient still makes urine - Oxygen supplementation if needed  Dyspnea - Probably secondary to volume overload and pneumonia.  - Improving.  Recent diagnosis of C. difficile colitis : Stool tested positive on 10/26/2016. Started on oral vancomycin October 10. Continues with intermittent diarrhea. -continue oral vancomycin  End-stage renal disease on hemodialysis - Nephrology following. Dialysis as per nephrology schedule  Hypertension -Monitor blood pressure. Continue amlodipine, hydralazine and metoprolol  Diabetes. Diet controlled. -Carb  modified diet  Thrombocytopenia.  - Presumably from infectious process. No signs symptoms of active bleeding. Patient might need outpatient hematology evaluation and follow-up -Monitor  Chronic diastolic heart failure. Recent echo on 09/19/2016 with an EF of 55-60% and mild LVH. -Continue aspirin and beta blocker and statin -Volume control with dialysis as noted above.  Pancytopenia. Recent hx of same.  -follow cultures. Monitor   DVT prophylaxis: SCDs Code Status:   Full Family Communication: None at bedside Disposition Plan: Home in 1-3 days  Consultants: Nephrology  Procedures: None  Antimicrobials: Vancomycin IV from 11/02/2016 Zosyn from 11/03/2016 Oral vancomycin is being continued   Subjective: Patient seen and examined at bedside. He still feels short of breath and is coughing but he feels better than yesterday. His diarrhea is slightly better. No overnight vomiting or abdominal pain  Objective: Vitals:   11/03/16 2004 11/03/16 2105 11/04/16 0026 11/04/16 0632  BP:  (!) 149/68 138/65 (!) 169/70  Pulse:  (!) 102 73 76  Resp:  18 16 18   Temp:  (!) 101.6 F (38.7 C) 98.9 F (37.2 C) (!) 100.7 F (38.2 C)  TempSrc:  Oral Oral Oral  SpO2: 94% 93% 99% 93%  Weight:    97.4 kg (214 lb 11.7 oz)  Height:        Intake/Output Summary (Last 24 hours) at 11/04/16 1136 Last data filed at 11/04/16 0655  Gross per 24 hour  Intake              580 ml  Output                0 ml  Net  580 ml   Filed Weights   11/02/16 1942 11/03/16 0629 11/04/16 4034  Weight: 96.4 kg (212 lb 8.4 oz) 97.3 kg (214 lb 8.1 oz) 97.4 kg (214 lb 11.7 oz)    Examination:  General exam: Appears calm and comfortable  Respiratory system: Bilateral decreased breath sound at bases With scattered crackles Cardiovascular system: S1 & S2 heard, rate controlled Gastrointestinal system: Abdomen is nondistended, soft and nontender. Normal bowel sounds heard. Extremities: No  cyanosis, clubbing; trace edema    Data Reviewed: I have personally reviewed following labs and imaging studies  CBC:  Recent Labs Lab 10/31/16 0846 11/01/16 0412 11/02/16 0840 11/03/16 0437 11/04/16 0420  WBC 3.2* 3.9* 3.4* 3.1* 4.6  NEUTROABS 2.1  --  2.1  --  3.0  HGB 12.0* 12.7* 11.8* 11.9* 11.7*  HCT 37.2* 39.4 36.8* 38.2* 35.8*  MCV 98.2 96.3 94.8 96.7 95.7  PLT 73* 89* 73* 67* 65*   Basic Metabolic Panel:  Recent Labs Lab 10/31/16 0846 11/01/16 0412 11/02/16 0840 11/03/16 0437 11/04/16 0420  NA 138 138 137 135 136  K 3.8 4.5 4.2 4.0 4.2  CL 100* 93* 96* 95* 95*  CO2 28 30 25 24 25   GLUCOSE 123* 124* 96 99 105*  BUN 29* 36* 57* 40* 63*  CREATININE 9.00* 8.24* 13.07* 11.19* 15.35*  CALCIUM 8.2* 9.2 8.5* 8.0* 7.9*  MG  --   --   --   --  2.1  PHOS  --  4.3  --   --   --    GFR: Estimated Creatinine Clearance: 6.1 mL/min (A) (by C-G formula based on SCr of 15.35 mg/dL (H)). Liver Function Tests:  Recent Labs Lab 10/31/16 0846 11/02/16 0840 11/04/16 0420  AST 23 27 26   ALT 19 19 22   ALKPHOS 93 88 79  BILITOT 0.7 0.9 1.1  PROT 6.9 6.3* 6.9  ALBUMIN 3.7 3.5 3.3*    Recent Labs Lab 11/02/16 0801  LIPASE 74*   No results for input(s): AMMONIA in the last 168 hours. Coagulation Profile:  Recent Labs Lab 11/02/16 0840  INR 1.06   Cardiac Enzymes:  Recent Labs Lab 10/31/16 0846  TROPONINI <0.03   BNP (last 3 results) No results for input(s): PROBNP in the last 8760 hours. HbA1C:  Recent Labs  11/02/16 0840  HGBA1C 5.4   CBG:  Recent Labs Lab 11/02/16 2137 11/03/16 0719 11/03/16 1148 11/03/16 1608 11/04/16 0722  GLUCAP 131* 102* 95 131* 137*   Lipid Profile: No results for input(s): CHOL, HDL, LDLCALC, TRIG, CHOLHDL, LDLDIRECT in the last 72 hours. Thyroid Function Tests: No results for input(s): TSH, T4TOTAL, FREET4, T3FREE, THYROIDAB in the last 72 hours. Anemia Panel: No results for input(s): VITAMINB12, FOLATE,  FERRITIN, TIBC, IRON, RETICCTPCT in the last 72 hours. Sepsis Labs:  Recent Labs Lab 11/02/16 0641 11/02/16 0908  LATICACIDVEN 1.66 1.15    Recent Results (from the past 240 hour(s))  Urine culture     Status: Abnormal   Collection Time: 10/26/16  5:00 PM  Result Value Ref Range Status   Specimen Description URINE, RANDOM  Final   Special Requests NONE  Final   Culture MULTIPLE SPECIES PRESENT, SUGGEST RECOLLECTION (A)  Final   Report Status 10/28/2016 FINAL  Final  Gastrointestinal Panel by PCR , Stool     Status: None   Collection Time: 10/26/16  8:21 PM  Result Value Ref Range Status   Campylobacter species NOT DETECTED NOT DETECTED Final   Plesimonas shigelloides NOT  DETECTED NOT DETECTED Final   Salmonella species NOT DETECTED NOT DETECTED Final   Yersinia enterocolitica NOT DETECTED NOT DETECTED Final   Vibrio species NOT DETECTED NOT DETECTED Final   Vibrio cholerae NOT DETECTED NOT DETECTED Final   Enteroaggregative E coli (EAEC) NOT DETECTED NOT DETECTED Final   Enteropathogenic E coli (EPEC) NOT DETECTED NOT DETECTED Final   Enterotoxigenic E coli (ETEC) NOT DETECTED NOT DETECTED Final   Shiga like toxin producing E coli (STEC) NOT DETECTED NOT DETECTED Final   Shigella/Enteroinvasive E coli (EIEC) NOT DETECTED NOT DETECTED Final   Cryptosporidium NOT DETECTED NOT DETECTED Final   Cyclospora cayetanensis NOT DETECTED NOT DETECTED Final   Entamoeba histolytica NOT DETECTED NOT DETECTED Final   Giardia lamblia NOT DETECTED NOT DETECTED Final   Adenovirus F40/41 NOT DETECTED NOT DETECTED Final   Astrovirus NOT DETECTED NOT DETECTED Final   Norovirus GI/GII NOT DETECTED NOT DETECTED Final   Rotavirus A NOT DETECTED NOT DETECTED Final   Sapovirus (I, II, IV, and V) NOT DETECTED NOT DETECTED Final  C difficile quick scan w PCR reflex     Status: Abnormal   Collection Time: 10/26/16  8:21 PM  Result Value Ref Range Status   C Diff antigen POSITIVE (A) NEGATIVE Final    C Diff toxin NEGATIVE NEGATIVE Final   C Diff interpretation Results are indeterminate. See PCR results.  Final  Clostridium Difficile by PCR     Status: Abnormal   Collection Time: 10/26/16  8:21 PM  Result Value Ref Range Status   Toxigenic C Difficile by pcr POSITIVE (A) NEGATIVE Final    Comment: Positive for toxigenic C. difficile with little to no toxin production. Only treat if clinical presentation suggests symptomatic illness.  MRSA PCR Screening     Status: None   Collection Time: 10/31/16  1:46 PM  Result Value Ref Range Status   MRSA by PCR NEGATIVE NEGATIVE Final    Comment:        The GeneXpert MRSA Assay (FDA approved for NASAL specimens only), is one component of a comprehensive MRSA colonization surveillance program. It is not intended to diagnose MRSA infection nor to guide or monitor treatment for MRSA infections.   Culture, blood (Routine x 2)     Status: None (Preliminary result)   Collection Time: 11/02/16  6:20 AM  Result Value Ref Range Status   Specimen Description BLOOD RIGHT ARM  Final   Special Requests   Final    BOTTLES DRAWN AEROBIC AND ANAEROBIC Blood Culture adequate volume   Culture NO GROWTH 1 DAY  Final   Report Status PENDING  Incomplete  Culture, blood (Routine x 2)     Status: None (Preliminary result)   Collection Time: 11/02/16  6:33 AM  Result Value Ref Range Status   Specimen Description BLOOD RIGHT HAND  Final   Special Requests IN PEDIATRIC BOTTLE Blood Culture adequate volume  Final   Culture NO GROWTH 1 DAY  Final   Report Status PENDING  Incomplete  Culture, blood (routine x 2)     Status: None (Preliminary result)   Collection Time: 11/02/16  5:00 PM  Result Value Ref Range Status   Specimen Description BLOOD HEMODIALYSIS FISTULA  Final   Special Requests   Final    BOTTLES DRAWN AEROBIC AND ANAEROBIC Blood Culture adequate volume   Culture NO GROWTH < 24 HOURS  Final   Report Status PENDING  Incomplete  MRSA PCR Screening  Status: None   Collection Time: 11/02/16  9:40 PM  Result Value Ref Range Status   MRSA by PCR NEGATIVE NEGATIVE Final    Comment:        The GeneXpert MRSA Assay (FDA approved for NASAL specimens only), is one component of a comprehensive MRSA colonization surveillance program. It is not intended to diagnose MRSA infection nor to guide or monitor treatment for MRSA infections.          Radiology Studies: Dg Chest 2 View  Result Date: 11/04/2016 CLINICAL DATA:  Dyspnea. EXAM: CHEST  2 VIEW COMPARISON:  Radiographs of November 02, 2016 FINDINGS: The heart size and mediastinal contours are within normal limits. No pneumothorax or pleural effusion is noted. Left lung is clear. Multifocal airspace opacities are noted in right upper lobe concerning for possible pneumonia. The visualized skeletal structures are unremarkable. IMPRESSION: Interval development of multifocal airspace opacities in right upper lobe concerning for pneumonia. Electronically Signed   By: Marijo Conception, M.D.   On: 11/04/2016 07:40        Scheduled Meds: . acetaminophen  1,000 mg Oral Once  . amLODipine  10 mg Oral Daily  . aspirin EC  81 mg Oral Daily  . [START ON 11/05/2016] calcitRIOL  0.25 mcg Oral Q M,W,F-HD  . calcitRIOL  1.25 mcg Oral Once per day on Mon Wed Fri  . cholecalciferol  1,000 Units Oral Daily  . clonazePAM  0.5 mg Oral BID  . feeding supplement (PRO-STAT SUGAR FREE 64)  30 mL Oral BID  . guaiFENesin  600 mg Oral BID  . hydrALAZINE  50 mg Oral Q8H  . insulin aspart  0-9 Units Subcutaneous TID WC  . ipratropium-albuterol  3 mL Nebulization BID  . mouth rinse  15 mL Mouth Rinse BID  . metoprolol tartrate  75 mg Oral BID  . mometasone-formoterol  2 puff Inhalation BID  . multivitamin  1 tablet Oral QHS  . pravastatin  40 mg Oral QPM  . sevelamer carbonate  2,400 mg Oral TID WC  . vancomycin  125 mg Oral Q6H   Continuous Infusions: . piperacillin-tazobactam (ZOSYN)  IV 3.375 g  (11/04/16 0922)  . [START ON 11/05/2016] vancomycin       LOS: 2 days        Aline August, MD [facility] Pager [phone]  If 7PM-7AM, please contact night-coverage [email or web address] Password [email or web address] 11/04/2016, 11:36 AM

## 2016-11-04 NOTE — Progress Notes (Signed)
[facility] Progress Note  Subjective: still having some cough but a lot better.  No sob , no abd pain or CP.  Temps comign down but slowly.   Vitals:   11/03/16 2004 11/03/16 2105 11/04/16 0026 11/04/16 0632  BP:  (!) 149/68 138/65 (!) 169/70  Pulse:  (!) 102 73 76  Resp:  18 16 18   Temp:  (!) 101.6 F (38.7 C) 98.9 F (37.2 C) (!) 100.7 F (38.2 C)  TempSrc:  Oral Oral Oral  SpO2: 94% 93% 99% 93%  Weight:    97.4 kg (214 lb 11.7 oz)  Height:        Inpatient medications: . acetaminophen  1,000 mg Oral Once  . amLODipine  10 mg Oral Daily  . aspirin EC  81 mg Oral Daily  . [START ON 11/05/2016] calcitRIOL  0.25 mcg Oral Q M,W,F-HD  . calcitRIOL  1.25 mcg Oral Once per day on Mon Wed Fri  . cholecalciferol  1,000 Units Oral Daily  . clonazePAM  0.5 mg Oral BID  . feeding supplement (PRO-STAT SUGAR FREE 64)  30 mL Oral BID  . guaiFENesin  600 mg Oral BID  . hydrALAZINE  50 mg Oral Q8H  . insulin aspart  0-9 Units Subcutaneous TID WC  . ipratropium-albuterol  3 mL Nebulization BID  . mouth rinse  15 mL Mouth Rinse BID  . metoprolol tartrate  75 mg Oral BID  . mometasone-formoterol  2 puff Inhalation BID  . multivitamin  1 tablet Oral QHS  . pravastatin  40 mg Oral QPM  . sevelamer carbonate  2,400 mg Oral TID WC  . vancomycin  125 mg Oral Q6H   . piperacillin-tazobactam (ZOSYN)  IV 3.375 g (11/04/16 0922)  . [START ON 11/05/2016] vancomycin     acetaminophen **OR** acetaminophen, diphenhydrAMINE, guaiFENesin-dextromethorphan, HYDROcodone-acetaminophen, ipratropium-albuterol, metoCLOPramide, ondansetron **OR** ondansetron (ZOFRAN) IV, senna-docusate, traMADol  Exam: Alert, no distress, not toxic   no jvd Chest clear bilat RRR no mrg Abd soft ntnd no ascites Ext no edema or wounds NF, ox 3 LFA AVF +thrill  CXR - no active disease   Dialysis: MWF [place] ([facility]) 4h 90min  100kg  2/2  Hep 4000  LFA AVF - Calcitriol 1.50mcg PO q HD -  Venofer 50mg  IV weekly - BMM meds: Phoslo 4/meals      Impression: 1. Fevers - blood cx's pend. Hx recent Cdif, vs UTI, vs viral vs other.  Getting IV vanc/ zosyn now.  2. SOB - will lower edw with HD. CXR was neg but BP's are high 3. HTN - continues on home BP meds (MTP/ norvasc/ hydral) 4. Vol lowering dry wt, 2kg under 5. Anemia - Hb 11, no need esa 6. MBD cont meds 7. COPD 8. Thrombocytopenia - recurrent issue going back; two flares in 2016 and one in 2017   Plan - HD Monday   Kelly Splinter MD [place] pager [phone]   11/04/2016, 10:39 AM    Recent Labs Lab 11/01/16 0412 11/02/16 0840 11/03/16 0437 11/04/16 0420  NA 138 137 135 136  K 4.5 4.2 4.0 4.2  CL 93* 96* 95* 95*  CO2 30 25 24 25   GLUCOSE 124* 96 99 105*  BUN 36* 57* 40* 63*  CREATININE 8.24* 13.07* 11.19* 15.35*  CALCIUM 9.2 8.5* 8.0* 7.9*  PHOS 4.3  --   --   --     Recent Labs Lab 10/31/16 0846 11/02/16 0840 11/04/16 0420  AST 23 27  26  ALT 19 19 22   ALKPHOS 93 88 79  BILITOT 0.7 0.9 1.1  PROT 6.9 6.3* 6.9  ALBUMIN 3.7 3.5 3.3*    Recent Labs Lab 10/31/16 0846  11/02/16 0840 11/03/16 0437 11/04/16 0420  WBC 3.2*  < > 3.4* 3.1* 4.6  NEUTROABS 2.1  --  2.1  --  3.0  HGB 12.0*  < > 11.8* 11.9* 11.7*  HCT 37.2*  < > 36.8* 38.2* 35.8*  MCV 98.2  < > 94.8 96.7 95.7  PLT 73*  < > 73* 67* 65*  < > = values in this interval not displayed. Iron/TIBC/Ferritin/ %Sat    Component Value Date/Time   IRON 90 01/19/2010 1016   TIBC 225 01/19/2010 1016   FERRITIN 318 01/19/2010 1016   IRONPCTSAT 40 01/19/2010 1016

## 2016-11-04 NOTE — Progress Notes (Signed)
Pt puts self on/off cpap. RT will monitor.

## 2016-11-04 NOTE — Progress Notes (Signed)
Patient complained of nausea, indigestion and pain in the abdomen. PRN medication for nausea given, Reglan ordered for indigestion per NP on call, and Tramadol given for pain per patient request. Wife and daughter at bedside.  Will continue to monitor.  Arena Lindahl, RN

## 2016-11-04 NOTE — Evaluation (Signed)
Physical Therapy Evaluation Patient Details Name: Kirk [name] MRN: [id number] DOB: 05/24/1960 Today's Date: 11/04/2016   History of Present Illness  56 year old male with history of ESRD on dialysis, hypertension, obstructive sleep apnea, COPD, diastolic CHF, GERD, chronic headache, pneumonia and recent hospitalization to [facility] and discharge on 11/01/2016 for C. difficile colitis treated with oral vancomycin. He presented on 11/02/2016 with fever and shortness of breath. He was admitted and started on broad-spectrum antibiotics.  Clinical Impression   Patient evaluated by Physical Therapy with no further acute PT needs identified. All education has been completed and the patient has no further questions.  See below for any follow-up Physical Therapy or equipment needs. PT is signing off. Thank you for this referral.  Educated pt on procedure to be able to safely walk hallways on Enteric Precautions.     Follow Up Recommendations No PT follow up    Equipment Recommendations  None recommended by PT    Recommendations for Other Services       Precautions / Restrictions Precautions Precaution Comments: Enteric Prec      Mobility  Bed Mobility                  Transfers Overall transfer level: Needs assistance Equipment used: None Transfers: Sit to/from Stand Sit to Stand: Modified independent (Device/Increase time)         General transfer comment: Noted incr use of UEs to stabilize  Ambulation/Gait Ambulation/Gait assistance: Modified independent (Device/Increase time) Ambulation Distance (Feet): 200 Feet Assistive device: None Gait Pattern/deviations: Step-through pattern (Bil LEs externally rotated slightly)   Gait velocity interpretation: Below normal speed for age/gender General Gait Details: Slow pace, but no difficulty; Pt reports he takes some time to "get started" when he first gets up and that is his nrmal  Marine scientist [place] (Stroke Patients Only)       Balance                                             Pertinent Vitals/Pain Pain Assessment: Faces Faces Pain Scale: Hurts a little bit Pain Location: Occasional andominal cramping; reports this is improving Pain Descriptors / Indicators: Cramping Pain Intervention(s): Monitored during session    Home Living Family/patient expects to be discharged to:: Private residence Living Arrangements: Spouse/significant other Available Help at Discharge: Family;Available PRN/intermittently Type of Home: House Home Access: Stairs to enter Entrance Stairs-Rails: Right (reports no difficulty) Entrance Stairs-Number of Steps: 3 Home Layout: Two level;Able to live on main level with bedroom/bathroom        Prior Function Level of Independence: Independent         Comments: Drives self to HD     Hand Dominance        Extremity/Trunk Assessment   Upper Extremity Assessment Upper Extremity Assessment: Overall WFL for tasks assessed    Lower Extremity Assessment Lower Extremity Assessment: Overall WFL for tasks assessed       Communication   Communication: No difficulties  Cognition Arousal/Alertness: Awake/alert Behavior During Therapy: WFL for tasks assessed/performed;Flat affect Overall Cognitive Status: Within Functional Limits for tasks assessed  General Comments General comments (skin integrity, edema, etc.): Walked on Room Air and O2 sats remained greater tahn or equal to 93%; HR 80 at end of walk    Exercises     Assessment/Plan    PT Assessment Patent does not need any further PT services  PT Problem List         PT Treatment Interventions      PT Goals (Current goals can be found in the Care Plan section)  Acute Rehab PT Goals Patient Stated Goal: Feel better PT Goal Formulation: All assessment and  education complete, DC therapy    Frequency     Barriers to discharge        Co-evaluation               AM-PAC PT "6 Clicks" Daily Activity  Outcome Measure Difficulty turning over in bed (including adjusting bedclothes, sheets and blankets)?: None Difficulty moving from lying on back to sitting on the side of the bed? : A Little Difficulty sitting down on and standing up from a chair with arms (e.g., wheelchair, bedside commode, etc,.)?: A Little Help needed moving to and from a bed to chair (including a wheelchair)?: None Help needed walking in hospital room?: None Help needed climbing 3-5 steps with a railing? : None 6 Click Score: 22    End of Session   Activity Tolerance: Patient tolerated treatment well Patient left: in chair Nurse Communication: Mobility status PT Visit Diagnosis: Unsteadiness on feet (R26.81);Muscle weakness (generalized) (M62.81)    Time: 5462-7035 PT Time Calculation (min) (ACUTE ONLY): 12 min   Charges:   PT Evaluation $PT Eval Low Complexity: 1 Low     PT G Codes:        Roney Marion, PT  Acute Rehabilitation Services Pager [phone] Office [phone]   [place]letta [place] 11/04/2016, 8:13 AM

## 2016-11-05 ENCOUNTER — Inpatient Hospital Stay (HOSPITAL_COMMUNITY): Payer: Medicare Other

## 2016-11-05 LAB — CBC WITH DIFFERENTIAL/PLATELET
BASOS PCT: 1 %
Basophils Absolute: 0 10*3/uL (ref 0.0–0.1)
EOS PCT: 2 %
Eosinophils Absolute: 0.1 10*3/uL (ref 0.0–0.7)
HEMATOCRIT: 32.9 % — AB (ref 39.0–52.0)
HEMOGLOBIN: 10.4 g/dL — AB (ref 13.0–17.0)
LYMPHS PCT: 32 %
Lymphs Abs: 1 10*3/uL (ref 0.7–4.0)
MCH: 30.1 pg (ref 26.0–34.0)
MCHC: 31.6 g/dL (ref 30.0–36.0)
MCV: 95.1 fL (ref 78.0–100.0)
MONO ABS: 0.4 10*3/uL (ref 0.1–1.0)
MONOS PCT: 14 %
NEUTROS PCT: 51 %
Neutro Abs: 1.6 10*3/uL — ABNORMAL LOW (ref 1.7–7.7)
PLATELETS: 59 10*3/uL — AB (ref 150–400)
RBC: 3.46 MIL/uL — AB (ref 4.22–5.81)
RDW: 14.7 % (ref 11.5–15.5)
WBC: 3.1 10*3/uL — AB (ref 4.0–10.5)

## 2016-11-05 LAB — BASIC METABOLIC PANEL
ANION GAP: 19 — AB (ref 5–15)
BUN: 80 mg/dL — ABNORMAL HIGH (ref 6–20)
CHLORIDE: 94 mmol/L — AB (ref 101–111)
CO2: 22 mmol/L (ref 22–32)
Calcium: 7.7 mg/dL — ABNORMAL LOW (ref 8.9–10.3)
Creatinine, Ser: 18.16 mg/dL — ABNORMAL HIGH (ref 0.61–1.24)
GFR calc Af Amer: 3 mL/min — ABNORMAL LOW (ref >60)
GFR, EST NON AFRICAN AMERICAN: 3 mL/min — AB (ref >60)
GLUCOSE: 102 mg/dL — AB (ref 65–99)
POTASSIUM: 4.2 mmol/L (ref 3.5–5.1)
Sodium: 135 mmol/L (ref 135–145)

## 2016-11-05 LAB — MAGNESIUM: Magnesium: 2.5 mg/dL — ABNORMAL HIGH (ref 1.7–2.4)

## 2016-11-05 LAB — GLUCOSE, CAPILLARY
GLUCOSE-CAPILLARY: 116 mg/dL — AB (ref 65–99)
GLUCOSE-CAPILLARY: 92 mg/dL (ref 65–99)
GLUCOSE-CAPILLARY: 146 mg/dL — AB (ref 65–99)

## 2016-11-05 LAB — PATHOLOGIST SMEAR REVIEW

## 2016-11-05 MED ORDER — IOPAMIDOL (ISOVUE-300) INJECTION 61%
15.0000 mL | INTRAVENOUS | Status: AC
  Administered 2016-11-05 (×2): 15 mL via ORAL

## 2016-11-05 MED ORDER — TRAMADOL HCL 50 MG PO TABS
50.0000 mg | ORAL_TABLET | Freq: Once | ORAL | Status: AC
  Administered 2016-11-05: 50 mg via ORAL
  Filled 2016-11-05: qty 1

## 2016-11-05 MED ORDER — CALCITRIOL 0.25 MCG PO CAPS
ORAL_CAPSULE | ORAL | Status: AC
  Filled 2016-11-05: qty 1

## 2016-11-05 MED ORDER — VANCOMYCIN HCL IN DEXTROSE 1-5 GM/200ML-% IV SOLN
INTRAVENOUS | Status: AC
  Filled 2016-11-05: qty 200

## 2016-11-05 NOTE — Progress Notes (Signed)
Patient complaining of a sever HA 10/10 this afternoon, medicated with Tramadol and Zofran for some nausea.  While ambulating back to bed from the bathroom he "collapsed",  Held up by NT and family, then assisted back to bed.  Per staff he was "lethargic and slow to respond".  NIHSS 0, patient complains of being weak and continued HA.  He doesn't "feel good".  BP 150-170/70-80  SR 66,  RR 24  O2 sats 95% on 2l [place].  Lung sounds with expiratory wheezes and rhonchi.  Duoneb given.  MD notified.  Order received for additional pain med.  RN to call if assistance needed.

## 2016-11-05 NOTE — Progress Notes (Signed)
Patient ID: Kirk [name], male   DOB: 11-Sep-1960, 56 y.o.   MRN: [id number]  PROGRESS NOTE    Kirk [name]  AJO:[id number] DOB: 12/27/1960 DOA: 11/02/2016 PCP: Gayland Curry, DO   Brief Narrative:  56 year old male with history of ESRD on dialysis, hypertension, obstructive sleep apnea, COPD, diastolic CHF, GERD, chronic headache, pneumonia and recent hospitalization to [facility] and discharge on 11/01/2016 for C. difficile colitis treated with oral vancomycin. He presented on 11/02/2016 with fever and shortness of breath. He was admitted and started on broad-spectrum antibiotics.   Assessment & Plan:   Principal Problem:   Dyspnea Active Problems:   HTN (hypertension)   DM (diabetes mellitus), type 2 with renal complications (HCC)   Anemia in chronic renal disease   Edema   Obesity   Thrombocytopenia (HCC)   Pancytopenia (HCC)   ESRD on dialysis ([place])   HLD (hyperlipidemia)   Obesity hypoventilation syndrome (HCC)   Fatigue   Clostridium difficile enteritis  Fever probably from pneumonia versus C. Difficile colitis - continue vancomycin and Zosyn for now. Cultures negative so far. Not having high temperature spikes over the last  24 hours. -Flu A and B are negative  - Repeat a.m. Labs  Probable healthcare associated pneumonia with concern for gram-negative rod/MRSA - continue vancomycin and Zosyn for now.  - Oxygen supplementation if needed  Dyspnea - Probably secondary to volume overload and pneumonia.  - Improving.  Recent diagnosis of C. difficile colitis : Stool tested positive on 10/26/2016. Started on oral vancomycin October 31, 2016. Continues with significant diarrhea. Patient is complaining of abdominal distention and x-ray abdomen done yesterday was inconclusive. We will get CAT scan of the abdomen. -continue oral vancomycin  End-stage renal disease on hemodialysis - Nephrology following. Dialysis as per nephrology  schedule  Hypertension -Monitor blood pressure. Continue amlodipine, hydralazine and metoprolol  Diabetes. Diet controlled. -Carb modified diet  Thrombocytopenia.  - Presumably from infectious process. No signs symptoms of active bleeding. Patient might need outpatient hematology evaluation and follow-up -Monitor  Chronic diastolic heart failure. Recent echo on 09/19/2016 with an EF of 55-60% and mild LVH. -Continue aspirin and beta blocker and statin -Volume control with dialysis as noted above.  Pancytopenia. Recent hx of same.  -follow cultures. Monitor   DVT prophylaxis: SCDs Code Status:   Full Family Communication: None at bedside Disposition Plan: Home in 1-3 days  Consultants: Nephrology  Procedures: None  Antimicrobials: Vancomycin IV from 11/02/2016 Zosyn from 11/03/2016 Oral vancomycin is being continued   Subjective: Patient seen and examined undergoing dialysis. He feels better than yesterday. His is still having a lot of diarrhea and complains of abdominal distension.  No overnight vomiting   Objective: Vitals:   11/05/16 0830 11/05/16 0900 11/05/16 0930 11/05/16 1000  BP: 127/64 139/65 138/71 (!) 158/68  Pulse: 65 64 62 68  Resp: 18 (!) 21 19 18   Temp:      TempSrc:      SpO2:      Weight:      Height:        Intake/Output Summary (Last 24 hours) at 11/05/16 1201 Last data filed at 11/05/16 0901  Gross per 24 hour  Intake              170 ml  Output                0 ml  Net  170 ml   Filed Weights   11/04/16 4650 11/05/16 0601 11/05/16 0710  Weight: 97.4 kg (214 lb 11.7 oz) 97.8 kg (215 lb 9.8 oz) 98.1 kg (216 lb 4.3 oz)    Examination:  General exam: Appears calm and comfortable  Respiratory system: Bilateral decreased breath sound at bases With scattered crackles Cardiovascular system: S1 & S2 heard, rate controlled Gastrointestinal system: Abdomen is nondistended, soft and nontender. Normal bowel sounds  heard. Extremities: No cyanosis, clubbing; trace edema    Data Reviewed: I have personally reviewed following labs and imaging studies  CBC:  Recent Labs Lab 10/31/16 0846 11/01/16 0412 11/02/16 0840 11/03/16 0437 11/04/16 0420 11/05/16 0509  WBC 3.2* 3.9* 3.4* 3.1* 4.6 3.1*  NEUTROABS 2.1  --  2.1  --  3.0 1.6*  HGB 12.0* 12.7* 11.8* 11.9* 11.7* 10.4*  HCT 37.2* 39.4 36.8* 38.2* 35.8* 32.9*  MCV 98.2 96.3 94.8 96.7 95.7 95.1  PLT 73* 89* 73* 67* 65* 59*   Basic Metabolic Panel:  Recent Labs Lab 11/01/16 0412 11/02/16 0840 11/03/16 0437 11/04/16 0420 11/05/16 0509  NA 138 137 135 136 135  K 4.5 4.2 4.0 4.2 4.2  CL 93* 96* 95* 95* 94*  CO2 30 25 24 25 22   GLUCOSE 124* 96 99 105* 102*  BUN 36* 57* 40* 63* 80*  CREATININE 8.24* 13.07* 11.19* 15.35* 18.16*  CALCIUM 9.2 8.5* 8.0* 7.9* 7.7*  MG  --   --   --  2.1 2.5*  PHOS 4.3  --   --   --   --    GFR: Estimated Creatinine Clearance: 5.2 mL/min (A) (by C-G formula based on SCr of 18.16 mg/dL (H)). Liver Function Tests:  Recent Labs Lab 10/31/16 0846 11/02/16 0840 11/04/16 0420  AST 23 27 26   ALT 19 19 22   ALKPHOS 93 88 79  BILITOT 0.7 0.9 1.1  PROT 6.9 6.3* 6.9  ALBUMIN 3.7 3.5 3.3*    Recent Labs Lab 11/02/16 0801  LIPASE 74*   No results for input(s): AMMONIA in the last 168 hours. Coagulation Profile:  Recent Labs Lab 11/02/16 0840  INR 1.06   Cardiac Enzymes:  Recent Labs Lab 10/31/16 0846  TROPONINI <0.03   BNP (last 3 results) No results for input(s): PROBNP in the last 8760 hours. HbA1C: No results for input(s): HGBA1C in the last 72 hours. CBG:  Recent Labs Lab 11/03/16 2121 11/04/16 0722 11/04/16 1217 11/04/16 1621 11/04/16 2107  GLUCAP 161* 137* 96 123* 121*   Lipid Profile: No results for input(s): CHOL, HDL, LDLCALC, TRIG, CHOLHDL, LDLDIRECT in the last 72 hours. Thyroid Function Tests: No results for input(s): TSH, T4TOTAL, FREET4, T3FREE, THYROIDAB in the last  72 hours. Anemia Panel: No results for input(s): VITAMINB12, FOLATE, FERRITIN, TIBC, IRON, RETICCTPCT in the last 72 hours. Sepsis Labs:  Recent Labs Lab 11/02/16 0641 11/02/16 0908  LATICACIDVEN 1.66 1.15    Recent Results (from the past 240 hour(s))  Urine culture     Status: Abnormal   Collection Time: 10/26/16  5:00 PM  Result Value Ref Range Status   Specimen Description URINE, RANDOM  Final   Special Requests NONE  Final   Culture MULTIPLE SPECIES PRESENT, SUGGEST RECOLLECTION (A)  Final   Report Status 10/28/2016 FINAL  Final  Gastrointestinal Panel by PCR , Stool     Status: None   Collection Time: 10/26/16  8:21 PM  Result Value Ref Range Status   Campylobacter species NOT DETECTED NOT DETECTED Final  Plesimonas shigelloides NOT DETECTED NOT DETECTED Final   Salmonella species NOT DETECTED NOT DETECTED Final   Yersinia enterocolitica NOT DETECTED NOT DETECTED Final   Vibrio species NOT DETECTED NOT DETECTED Final   Vibrio cholerae NOT DETECTED NOT DETECTED Final   Enteroaggregative E coli (EAEC) NOT DETECTED NOT DETECTED Final   Enteropathogenic E coli (EPEC) NOT DETECTED NOT DETECTED Final   Enterotoxigenic E coli (ETEC) NOT DETECTED NOT DETECTED Final   Shiga like toxin producing E coli (STEC) NOT DETECTED NOT DETECTED Final   Shigella/Enteroinvasive E coli (EIEC) NOT DETECTED NOT DETECTED Final   Cryptosporidium NOT DETECTED NOT DETECTED Final   Cyclospora cayetanensis NOT DETECTED NOT DETECTED Final   Entamoeba histolytica NOT DETECTED NOT DETECTED Final   Giardia lamblia NOT DETECTED NOT DETECTED Final   Adenovirus F40/41 NOT DETECTED NOT DETECTED Final   Astrovirus NOT DETECTED NOT DETECTED Final   Norovirus GI/GII NOT DETECTED NOT DETECTED Final   Rotavirus A NOT DETECTED NOT DETECTED Final   Sapovirus (I, II, IV, and V) NOT DETECTED NOT DETECTED Final  C difficile quick scan w PCR reflex     Status: Abnormal   Collection Time: 10/26/16  8:21 PM  Result  Value Ref Range Status   C Diff antigen POSITIVE (A) NEGATIVE Final   C Diff toxin NEGATIVE NEGATIVE Final   C Diff interpretation Results are indeterminate. See PCR results.  Final  Clostridium Difficile by PCR     Status: Abnormal   Collection Time: 10/26/16  8:21 PM  Result Value Ref Range Status   Toxigenic C Difficile by pcr POSITIVE (A) NEGATIVE Final    Comment: Positive for toxigenic C. difficile with little to no toxin production. Only treat if clinical presentation suggests symptomatic illness.  MRSA PCR Screening     Status: None   Collection Time: 10/31/16  1:46 PM  Result Value Ref Range Status   MRSA by PCR NEGATIVE NEGATIVE Final    Comment:        The GeneXpert MRSA Assay (FDA approved for NASAL specimens only), is one component of a comprehensive MRSA colonization surveillance program. It is not intended to diagnose MRSA infection nor to guide or monitor treatment for MRSA infections.   Culture, blood (Routine x 2)     Status: None (Preliminary result)   Collection Time: 11/02/16  6:20 AM  Result Value Ref Range Status   Specimen Description BLOOD RIGHT ARM  Final   Special Requests   Final    BOTTLES DRAWN AEROBIC AND ANAEROBIC Blood Culture adequate volume   Culture NO GROWTH 2 DAYS  Final   Report Status PENDING  Incomplete  Culture, blood (Routine x 2)     Status: None (Preliminary result)   Collection Time: 11/02/16  6:33 AM  Result Value Ref Range Status   Specimen Description BLOOD RIGHT HAND  Final   Special Requests IN PEDIATRIC BOTTLE Blood Culture adequate volume  Final   Culture NO GROWTH 2 DAYS  Final   Report Status PENDING  Incomplete  Culture, blood (routine x 2)     Status: None (Preliminary result)   Collection Time: 11/02/16  5:00 PM  Result Value Ref Range Status   Specimen Description BLOOD HEMODIALYSIS FISTULA  Final   Special Requests   Final    BOTTLES DRAWN AEROBIC AND ANAEROBIC Blood Culture adequate volume   Culture NO GROWTH 2  DAYS  Final   Report Status PENDING  Incomplete  MRSA PCR Screening  Status: None   Collection Time: 11/02/16  9:40 PM  Result Value Ref Range Status   MRSA by PCR NEGATIVE NEGATIVE Final    Comment:        The GeneXpert MRSA Assay (FDA approved for NASAL specimens only), is one component of a comprehensive MRSA colonization surveillance program. It is not intended to diagnose MRSA infection nor to guide or monitor treatment for MRSA infections.          Radiology Studies: Dg Chest 2 View  Result Date: 11/04/2016 CLINICAL DATA:  Dyspnea. EXAM: CHEST  2 VIEW COMPARISON:  Radiographs of November 02, 2016 FINDINGS: The heart size and mediastinal contours are within normal limits. No pneumothorax or pleural effusion is noted. Left lung is clear. Multifocal airspace opacities are noted in right upper lobe concerning for possible pneumonia. The visualized skeletal structures are unremarkable. IMPRESSION: Interval development of multifocal airspace opacities in right upper lobe concerning for pneumonia. Electronically Signed   By: Marijo Conception, M.D.   On: 11/04/2016 07:40   Dg Abd 1 View  Result Date: 11/04/2016 CLINICAL DATA:  Abdominal pain.  Celsius diff colitis. EXAM: ABDOMEN - 1 VIEW COMPARISON:  Body CT 07/02/2014 FINDINGS: The bowel gas pattern is normal. No radio-opaque calculi or other significant radiographic abnormality are seen. Postsurgical changes in the right upper and lower quadrants. IMPRESSION: Nonobstructive bowel gas pattern. Electronically Signed   By: Fidela Salisbury M.D.   On: 11/04/2016 17:08        Scheduled Meds: . acetaminophen  1,000 mg Oral Once  . amLODipine  10 mg Oral Daily  . aspirin EC  81 mg Oral Daily  . calcitRIOL  0.25 mcg Oral Q M,W,F-HD  . calcitRIOL  1.25 mcg Oral Once per day on Mon Wed Fri  . cholecalciferol  1,000 Units Oral Daily  . clonazePAM  0.5 mg Oral BID  . famotidine  20 mg Oral Q24H  . feeding supplement (PRO-STAT  SUGAR FREE 64)  30 mL Oral BID  . guaiFENesin  600 mg Oral BID  . hydrALAZINE  50 mg Oral Q8H  . insulin aspart  0-9 Units Subcutaneous TID WC  . ipratropium-albuterol  3 mL Nebulization BID  . mouth rinse  15 mL Mouth Rinse BID  . metoprolol tartrate  75 mg Oral BID  . mometasone-formoterol  2 puff Inhalation BID  . multivitamin  1 tablet Oral QHS  . pravastatin  40 mg Oral QPM  . sevelamer carbonate  2,400 mg Oral TID WC  . vancomycin  125 mg Oral Q6H   Continuous Infusions: . piperacillin-tazobactam (ZOSYN)  IV Stopped (11/05/16 0109)  . vancomycin 1,000 mg (11/05/16 1043)     LOS: 3 days        Aline August, MD [facility] Pager [phone]  If 7PM-7AM, please contact night-coverage [email or web address] Password [email or web address] 11/05/2016, 12:01 PM

## 2016-11-05 NOTE — Progress Notes (Signed)
Returned from dialysis in stable condition.

## 2016-11-05 NOTE — Progress Notes (Signed)
RRT at bedside

## 2016-11-05 NOTE — Progress Notes (Signed)
After PRN medications are given patient slept without any complaints, stating that abdominal pain/indigestion is gone. Wife at bedside all night. Wife reported that she is still concerned that patient abdomen is distended and she also reported that patient had a loose stool that contained blood this morning.  Will follow up with day shift.  Deforrest Bogle, RN

## 2016-11-05 NOTE — Progress Notes (Signed)
Pt had syncopal episode on way to bathroom. Nurse tech at bedside. Pt lethargic, slow to respond. Pt 84% on RA. Nurse tech assisted pt into bed, placed nasal canula 2 liters pt now 95%. Pt unable to answer when his date of birth is, pt stated he was at the hospital and when asked who the president is he stated Bush Pt speech is slurred  Called MD  MD aware stated to monitor vital signs  Called RRT

## 2016-11-05 NOTE — Procedures (Signed)
No c/o's, on HD, fevers down , almost normal.  No new c/o.    I was present at this dialysis session, have reviewed the session itself and made  appropriate changes Kelly Splinter MD [place] pager [phone]   11/05/2016, 10:14 AM

## 2016-11-05 NOTE — Care Management Note (Signed)
Case Management Note  Patient Details  Name: Kirk [name] MRN: [id number] Date of Birth: [birth year]  Subjective/Objective:  Dyspnea                 Action/Plan: 11/05/2016 - CM following for DCP; B Pennie Rushing   09/13/2016-    Pt is from home, lives with wife and daughter. Pt is ind with ADL's. He has PCP, drives himself to appointments, has insurance with drug coverage. He has CPAP and neb machine, no home oxygen. Pt gets HD at [facility] in [place] three days a week. Family at bedside. S [place] RN CM  Expected Discharge Date:   possibly 11/08/2016               Expected Discharge Plan:  Home/Self Care  In-House Referral:   [facility]  Discharge planning Services  CM Consult   Status of Service:  In process, will continue to follow  Sherrilyn Rist [phone] 11/05/2016, 3:36 PM

## 2016-11-05 NOTE — Progress Notes (Addendum)
This RN arrived back from break & notified rapid response RN at bedside for near syncopal episode. Wife remains at bedside as well as rapid response nurse. Pt awake and oriented upon initial assessment. VSS. Alert & oriented x3. Pt with c/o ongoing 10/10 headache, unrelieved by PRN tramadol administration @ 5329. Dr. Starla Link notified pt remains with 10/10 headache stating he is "hardly able to open his eyes" s/p PRN Tramadol. Verbal order to administer one time dose of additional 50mg  PO Tramadol (this medication specifically requested by wife). Will continue to monitor.

## 2016-11-05 NOTE — Progress Notes (Signed)
Patient states he will place self on and off of CPAP when ready.RT informed patient if he has any trouble have RN contact RT.

## 2016-11-05 NOTE — Progress Notes (Signed)
PT Cancellation Note  Patient Details Name: Kirk [name] MRN: [id number] DOB: 11-05-1960   Cancelled Treatment:     PT eval received, chart reviewed. Pt evaluated on 10/14 with no acute PT needs identified. It appears that pt is functioning at a modified independent level at this time. Please see full PT evaluation for details. Will again sign off at this time. Thank you.   Thelma Comp 11/05/2016, 6:49 AM   Rolinda Roan, PT, DPT Acute Rehabilitation Services Pager: [phone]

## 2016-11-06 ENCOUNTER — Inpatient Hospital Stay (HOSPITAL_COMMUNITY): Payer: Medicare Other

## 2016-11-06 DIAGNOSIS — R197 Diarrhea, unspecified: Secondary | ICD-10-CM

## 2016-11-06 DIAGNOSIS — K625 Hemorrhage of anus and rectum: Secondary | ICD-10-CM

## 2016-11-06 DIAGNOSIS — D696 Thrombocytopenia, unspecified: Secondary | ICD-10-CM

## 2016-11-06 LAB — BASIC METABOLIC PANEL
Anion gap: 14 (ref 5–15)
BUN: 42 mg/dL — AB (ref 6–20)
CO2: 26 mmol/L (ref 22–32)
CREATININE: 13.19 mg/dL — AB (ref 0.61–1.24)
Calcium: 8.4 mg/dL — ABNORMAL LOW (ref 8.9–10.3)
Chloride: 94 mmol/L — ABNORMAL LOW (ref 101–111)
GFR calc Af Amer: 4 mL/min — ABNORMAL LOW (ref >60)
GFR, EST NON AFRICAN AMERICAN: 4 mL/min — AB (ref >60)
GLUCOSE: 99 mg/dL (ref 65–99)
Potassium: 4.5 mmol/L (ref 3.5–5.1)
SODIUM: 134 mmol/L — AB (ref 135–145)

## 2016-11-06 LAB — URINALYSIS, ROUTINE W REFLEX MICROSCOPIC
BILIRUBIN URINE: NEGATIVE
GLUCOSE, UA: NEGATIVE mg/dL
KETONES UR: NEGATIVE mg/dL
NITRITE: NEGATIVE
PH: 6 (ref 5.0–8.0)
Protein, ur: >=300 mg/dL — AB
SPECIFIC GRAVITY, URINE: 1.015 (ref 1.005–1.030)

## 2016-11-06 LAB — CBC WITH DIFFERENTIAL/PLATELET
BASOS ABS: 0 10*3/uL (ref 0.0–0.1)
Basophils Relative: 1 %
Eosinophils Absolute: 0.1 10*3/uL (ref 0.0–0.7)
Eosinophils Relative: 3 %
HCT: 36.1 % — ABNORMAL LOW (ref 39.0–52.0)
Hemoglobin: 11.6 g/dL — ABNORMAL LOW (ref 13.0–17.0)
LYMPHS ABS: 0.6 10*3/uL — AB (ref 0.7–4.0)
LYMPHS PCT: 20 %
MCH: 30.3 pg (ref 26.0–34.0)
MCHC: 32.1 g/dL (ref 30.0–36.0)
MCV: 94.3 fL (ref 78.0–100.0)
Monocytes Absolute: 0.5 10*3/uL (ref 0.1–1.0)
Monocytes Relative: 16 %
NEUTROS PCT: 60 %
Neutro Abs: 2 10*3/uL (ref 1.7–7.7)
PLATELETS: 65 10*3/uL — AB (ref 150–400)
RBC: 3.83 MIL/uL — AB (ref 4.22–5.81)
RDW: 14.3 % (ref 11.5–15.5)
WBC: 3.3 10*3/uL — AB (ref 4.0–10.5)

## 2016-11-06 LAB — GLUCOSE, CAPILLARY
GLUCOSE-CAPILLARY: 93 mg/dL (ref 65–99)
Glucose-Capillary: 110 mg/dL — ABNORMAL HIGH (ref 65–99)
Glucose-Capillary: 115 mg/dL — ABNORMAL HIGH (ref 65–99)
Glucose-Capillary: 118 mg/dL — ABNORMAL HIGH (ref 65–99)

## 2016-11-06 LAB — MAGNESIUM: MAGNESIUM: 2.3 mg/dL (ref 1.7–2.4)

## 2016-11-06 LAB — STREP PNEUMONIAE URINARY ANTIGEN: STREP PNEUMO URINARY ANTIGEN: POSITIVE — AB

## 2016-11-06 MED ORDER — IPRATROPIUM-ALBUTEROL 0.5-2.5 (3) MG/3ML IN SOLN
3.0000 mL | RESPIRATORY_TRACT | Status: DC | PRN
  Administered 2016-11-06: 3 mL via RESPIRATORY_TRACT
  Filled 2016-11-06 (×2): qty 3

## 2016-11-06 MED ORDER — METRONIDAZOLE IN NACL 5-0.79 MG/ML-% IV SOLN
500.0000 mg | Freq: Four times a day (QID) | INTRAVENOUS | Status: DC
  Administered 2016-11-06 – 2016-11-11 (×18): 500 mg via INTRAVENOUS
  Filled 2016-11-06 (×23): qty 100

## 2016-11-06 MED ORDER — DEXTROSE 5 % IV SOLN
500.0000 mg | INTRAVENOUS | Status: DC
  Administered 2016-11-06: 500 mg via INTRAVENOUS
  Filled 2016-11-06 (×4): qty 500

## 2016-11-06 MED ORDER — VANCOMYCIN 50 MG/ML ORAL SOLUTION
500.0000 mg | Freq: Four times a day (QID) | ORAL | Status: DC
  Administered 2016-11-06 – 2016-11-11 (×17): 500 mg via ORAL
  Filled 2016-11-06 (×22): qty 10

## 2016-11-06 NOTE — Progress Notes (Addendum)
[facility] Progress Note  Subjective: still having lots of diarrhea.  tmax just under 101.  Having prolonged bleeding after dialysis from L AVF as well.     Vitals:   11/06/16 0402 11/06/16 0500 11/06/16 0717 11/06/16 0808  BP: (!) 142/80 (!) 150/80 (!) 149/83   Pulse:      Resp: 17 18 (!) 22   Temp: 97.8 F (36.6 C)     TempSrc:      SpO2: 91% 99% 93% 92%  Weight: 96.9 kg (213 lb 11.2 oz)     Height:        Inpatient medications: . amLODipine  10 mg Oral Daily  . aspirin EC  81 mg Oral Daily  . calcitRIOL  0.25 mcg Oral Q M,W,F-HD  . calcitRIOL  1.25 mcg Oral Once per day on Mon Wed Fri  . cholecalciferol  1,000 Units Oral Daily  . clonazePAM  0.5 mg Oral BID  . famotidine  20 mg Oral Q24H  . feeding supplement (PRO-STAT SUGAR FREE 64)  30 mL Oral BID  . guaiFENesin  600 mg Oral BID  . hydrALAZINE  50 mg Oral Q8H  . insulin aspart  0-9 Units Subcutaneous TID WC  . ipratropium-albuterol  3 mL Nebulization BID  . mouth rinse  15 mL Mouth Rinse BID  . metoprolol tartrate  75 mg Oral BID  . mometasone-formoterol  2 puff Inhalation BID  . multivitamin  1 tablet Oral QHS  . pravastatin  40 mg Oral QPM  . sevelamer carbonate  2,400 mg Oral TID WC  . vancomycin  500 mg Oral Q6H   . azithromycin    . metronidazole    . piperacillin-tazobactam (ZOSYN)  IV Stopped (11/06/16 0219)   acetaminophen **OR** acetaminophen, diphenhydrAMINE, guaiFENesin-dextromethorphan, HYDROcodone-acetaminophen, ipratropium-albuterol, metoCLOPramide, ondansetron **OR** ondansetron (ZOFRAN) IV, traMADol  Exam: Alert, no distress, not toxic   no jvd Chest clear bilat RRR no mrg Abd soft ntnd no ascites Ext no edema or wounds NF, ox 3 LFA AVF +thrill  CXR - no active disease   Dialysis: MWF [facility] ([facility]) 4h 56min  100kg  2/2  Hep 4000  LFA AVF - Calcitriol 1.41mcg PO q HD - Venofer 50mg  IV weekly - BMM meds: Phoslo 4/meals      Impression: 1. Fevers/ diarrhea -  diarrhea continues, fevers better but still spiking some.    2. SOB - CXR x 3 neg for CHF, unclear cause.  Under dry wt by 3kg.  Agree with CT chest, can r/o PNA as well and possibly trim down abx.    3. HTN - continues on home BP meds  4. Vol - as above 5. Anemia - Hb 11, no need esa 6. MBD cont meds 7. COPD 8. Thrombocytopenia - recurrent issue.  Will dc heparin with HD due to prolonged bleeding.    Plan - HD tomorrow, ^UF goal, dc heparin on HD   Kelly Splinter MD [place] pager [phone]   11/06/2016, 11:33 AM    Recent Labs Lab 11/01/16 0412  11/04/16 0420 11/05/16 0509 11/06/16 0500  NA 138  < > 136 135 134*  K 4.5  < > 4.2 4.2 4.5  CL 93*  < > 95* 94* 94*  CO2 30  < > 25 22 26   GLUCOSE 124*  < > 105* 102* 99  BUN 36*  < > 63* 80* 42*  CREATININE 8.24*  < > 15.35* 18.16* 13.19*  CALCIUM 9.2  < >  7.9* 7.7* 8.4*  PHOS 4.3  --   --   --   --   < > = values in this interval not displayed.  Recent Labs Lab 10/31/16 0846 11/02/16 0840 11/04/16 0420  AST 23 27 26   ALT 19 19 22   ALKPHOS 93 88 79  BILITOT 0.7 0.9 1.1  PROT 6.9 6.3* 6.9  ALBUMIN 3.7 3.5 3.3*    Recent Labs Lab 11/04/16 0420 11/05/16 0509 11/06/16 0500  WBC 4.6 3.1* 3.3*  NEUTROABS 3.0 1.6* 2.0  HGB 11.7* 10.4* 11.6*  HCT 35.8* 32.9* 36.1*  MCV 95.7 95.1 94.3  PLT 65* 59* 65*   Iron/TIBC/Ferritin/ %Sat    Component Value Date/Time   IRON 90 01/19/2010 1016   TIBC 225 01/19/2010 1016   FERRITIN 318 01/19/2010 1016   IRONPCTSAT 40 01/19/2010 1016

## 2016-11-06 NOTE — Progress Notes (Signed)
Pt complaining of abdominal pain and panic like feeling. MD Clarise Cruz for GI at bedside now  Primary RN aware

## 2016-11-06 NOTE — Plan of Care (Signed)
Problem: Coping: Goal: Ability to identify and develop effective coping behavior will improve Outcome: Progressing Restless, no sleep, and anxiety family support at bedside

## 2016-11-06 NOTE — Progress Notes (Signed)
Pt complaining of general back pain. Coughing, Weak and lethargic poor appetite. 02 Sat 85 on R/A -90s % on 5 L Gave PRN breathing Treatment Turned Cough and Deep Breathing Improved. MD Aware

## 2016-11-06 NOTE — Progress Notes (Signed)
Patient ID: Kirk [name], male   DOB: 03-May-1960, 56 y.o.   MRN: [id number]  PROGRESS NOTE    Kirk [name]  WUJ:[id number] DOB: 20-Apr-1960 DOA: 11/02/2016 PCP: Gayland Curry, DO   Brief Narrative:  56 year old male with history of ESRD on dialysis, hypertension, obstructive sleep apnea, COPD, diastolic CHF, GERD, chronic headache, pneumonia and recent hospitalization to [facility] and discharge on 11/01/2016 for C. difficile colitis treated with oral vancomycin. He presented on 11/02/2016 with fever and shortness of breath. He was admitted and started on broad-spectrum antibiotics.   Assessment & Plan:   Principal Problem:   Dyspnea Active Problems:   HTN (hypertension)   DM (diabetes mellitus), type 2 with renal complications (HCC)   Anemia in chronic renal disease   Edema   Obesity   Thrombocytopenia (HCC)   Pancytopenia (HCC)   ESRD on dialysis ([place])   HLD (hyperlipidemia)   Obesity hypoventilation syndrome (HCC)   Fatigue   Clostridium difficile enteritis  Fever probably from pneumonia versus C. Difficile colitis - discontinue vancomycin; continue Zosyn; add Zithromax. Cultures negative so far. Still having intermittent fever -Flu A and B are negative  - Repeat a.m. Labs  Probable healthcare associated pneumonia with concern for gram-negative rod/MRSA - Discontinue vancomycin. Continue Zosyn. Add Zithromax for a typical coverage. Patient is still intermittently short of breath and coughing and is currently requiring higher oxygen supplementation  - if respiratory status does not improve, we will get CT of chest and probable pulmonary evaluation  Dyspnea - Probably secondary to volume overload and pneumonia.  - continue oxygen supplementation  Recent diagnosis of C. difficile colitis : Stool tested positive on 10/26/2016. Started on oral vancomycin October 31, 2016. Continues with significant diarrhea.  - still having a lot of diarrhea. CT abdomen done  yesterday showed diffuse colitis but no evidence of toxic megacolon. Increase oral vancomycin to 500 mg by mouth every 6 hours and add IV Flagyl. GI evaluation requested  End-stage renal disease on hemodialysis - Nephrology following. Dialysis as per nephrology schedule  Hypertension -Monitor blood pressure. Continue amlodipine, hydralazine and metoprolol  Headache - Patient apparently was complaining of severe headache yesterday which improved after 100 mg of tramadol If patient continues to have worsening headache, will probably get a CAT scan or MRI of the brain. - Patient has had history of headaches in the past and has been evaluated by neurology as an outpatient  Diabetes. Diet controlled. -Carb modified diet  Thrombocytopenia.  - Presumably from infectious process. No signs symptoms of active bleeding. Patient might need outpatient hematology evaluation and follow-up -Monitor  Chronic diastolic heart failure. Recent echo on 09/19/2016 with an EF of 55-60% and mild LVH. -Continue aspirin and beta blocker and statin -Volume control with dialysis as noted above.  Pancytopenia.  - Monitor   DVT prophylaxis: SCDs Code Status:   Full Family Communication: discussed with wife at bedside Disposition Plan: pending clinical improvement  Consultants: Nephrology; GI  Procedures: None  Antimicrobials: Vancomycin IV from 11/02/2016 Zosyn from 11/03/2016 Oral vancomycin is being continued   Subjective: Patient seen and examined undergoing dialysis. She does not feel well this morning. He is more short of breath. His diarrhea is not getting better. He was complaining of severe headache yesterday evening.   No overnight vomiting   Objective: Vitals:   11/06/16 0402 11/06/16 0500 11/06/16 0717 11/06/16 0808  BP: (!) 142/80 (!) 150/80 (!) 149/83   Pulse:      Resp:  17 18 (!) 22   Temp: 97.8 F (36.6 C)     TempSrc:      SpO2: 91% 99% 93% 92%  Weight: 96.9 kg (213 lb  11.2 oz)     Height:        Intake/Output Summary (Last 24 hours) at 11/06/16 0916 Last data filed at 11/06/16 0500  Gross per 24 hour  Intake              400 ml  Output             1000 ml  Net             -600 ml   Filed Weights   11/05/16 0710 11/05/16 1143 11/06/16 0402  Weight: 98.1 kg (216 lb 4.3 oz) 97.1 kg (214 lb 1.1 oz) 96.9 kg (213 lb 11.2 oz)    Examination:  General exam: Appears ill-looking.  Respiratory system: Bilateral decreased breath sound at bases With scattered crackles Cardiovascular system: S1 & S2 heard, rate controlled Gastrointestinal system: Abdomen is nondistended, soft and nontender. Normal bowel sounds heard. Extremities: No cyanosis, clubbing; trace edema    Data Reviewed: I have personally reviewed following labs and imaging studies  CBC:  Recent Labs Lab 10/31/16 0846  11/02/16 0840 11/03/16 0437 11/04/16 0420 11/05/16 0509 11/06/16 0500  WBC 3.2*  < > 3.4* 3.1* 4.6 3.1* 3.3*  NEUTROABS 2.1  --  2.1  --  3.0 1.6* 2.0  HGB 12.0*  < > 11.8* 11.9* 11.7* 10.4* 11.6*  HCT 37.2*  < > 36.8* 38.2* 35.8* 32.9* 36.1*  MCV 98.2  < > 94.8 96.7 95.7 95.1 94.3  PLT 73*  < > 73* 67* 65* 59* 65*  < > = values in this interval not displayed. Basic Metabolic Panel:  Recent Labs Lab 11/01/16 0412 11/02/16 0840 11/03/16 0437 11/04/16 0420 11/05/16 0509 11/06/16 0500  NA 138 137 135 136 135 134*  K 4.5 4.2 4.0 4.2 4.2 4.5  CL 93* 96* 95* 95* 94* 94*  CO2 30 25 24 25 22 26   GLUCOSE 124* 96 99 105* 102* 99  BUN 36* 57* 40* 63* 80* 42*  CREATININE 8.24* 13.07* 11.19* 15.35* 18.16* 13.19*  CALCIUM 9.2 8.5* 8.0* 7.9* 7.7* 8.4*  MG  --   --   --  2.1 2.5* 2.3  PHOS 4.3  --   --   --   --   --    GFR: Estimated Creatinine Clearance: 7.1 mL/min (A) (by C-G formula based on SCr of 13.19 mg/dL (H)). Liver Function Tests:  Recent Labs Lab 10/31/16 0846 11/02/16 0840 11/04/16 0420  AST 23 27 26   ALT 19 19 22   ALKPHOS 93 88 79  BILITOT 0.7  0.9 1.1  PROT 6.9 6.3* 6.9  ALBUMIN 3.7 3.5 3.3*    Recent Labs Lab 11/02/16 0801  LIPASE 74*   No results for input(s): AMMONIA in the last 168 hours. Coagulation Profile:  Recent Labs Lab 11/02/16 0840  INR 1.06   Cardiac Enzymes:  Recent Labs Lab 10/31/16 0846  TROPONINI <0.03   BNP (last 3 results) No results for input(s): PROBNP in the last 8760 hours. HbA1C: No results for input(s): HGBA1C in the last 72 hours. CBG:  Recent Labs Lab 11/04/16 2107 11/05/16 1307 11/05/16 1705 11/05/16 2056 11/06/16 0737  GLUCAP 121* 116* 92 146* 93   Lipid Profile: No results for input(s): CHOL, HDL, LDLCALC, TRIG, CHOLHDL, LDLDIRECT in the last 72 hours. Thyroid Function  Tests: No results for input(s): TSH, T4TOTAL, FREET4, T3FREE, THYROIDAB in the last 72 hours. Anemia Panel: No results for input(s): VITAMINB12, FOLATE, FERRITIN, TIBC, IRON, RETICCTPCT in the last 72 hours. Sepsis Labs:  Recent Labs Lab 11/02/16 0641 11/02/16 0908  LATICACIDVEN 1.66 1.15    Recent Results (from the past 240 hour(s))  MRSA PCR Screening     Status: None   Collection Time: 10/31/16  1:46 PM  Result Value Ref Range Status   MRSA by PCR NEGATIVE NEGATIVE Final    Comment:        The GeneXpert MRSA Assay (FDA approved for NASAL specimens only), is one component of a comprehensive MRSA colonization surveillance program. It is not intended to diagnose MRSA infection nor to guide or monitor treatment for MRSA infections.   Culture, blood (Routine x 2)     Status: None (Preliminary result)   Collection Time: 11/02/16  6:20 AM  Result Value Ref Range Status   Specimen Description BLOOD RIGHT ARM  Final   Special Requests   Final    BOTTLES DRAWN AEROBIC AND ANAEROBIC Blood Culture adequate volume   Culture NO GROWTH 3 DAYS  Final   Report Status PENDING  Incomplete  Culture, blood (Routine x 2)     Status: None (Preliminary result)   Collection Time: 11/02/16  6:33 AM    Result Value Ref Range Status   Specimen Description BLOOD RIGHT HAND  Final   Special Requests IN PEDIATRIC BOTTLE Blood Culture adequate volume  Final   Culture NO GROWTH 3 DAYS  Final   Report Status PENDING  Incomplete  Culture, blood (routine x 2)     Status: None (Preliminary result)   Collection Time: 11/02/16  5:00 PM  Result Value Ref Range Status   Specimen Description BLOOD HEMODIALYSIS FISTULA  Final   Special Requests   Final    BOTTLES DRAWN AEROBIC AND ANAEROBIC Blood Culture adequate volume   Culture NO GROWTH 3 DAYS  Final   Report Status PENDING  Incomplete  MRSA PCR Screening     Status: None   Collection Time: 11/02/16  9:40 PM  Result Value Ref Range Status   MRSA by PCR NEGATIVE NEGATIVE Final    Comment:        The GeneXpert MRSA Assay (FDA approved for NASAL specimens only), is one component of a comprehensive MRSA colonization surveillance program. It is not intended to diagnose MRSA infection nor to guide or monitor treatment for MRSA infections.          Radiology Studies: Ct Abdomen Pelvis Wo Contrast  Result Date: 11/05/2016 CLINICAL DATA:  Abdomen distension EXAM: CT ABDOMEN AND PELVIS WITHOUT CONTRAST TECHNIQUE: Multidetector CT imaging of the abdomen and pelvis was performed following the standard protocol without IV contrast. COMPARISON:  11/04/2016, 07/02/2014, 02/17/2014, 09/08/2013 FINDINGS: Lower chest: Lung bases demonstrate no consolidation or effusion. Mild centrilobular density within the bilateral lower lobes and right middle lobe. Small ground-glass nodular density in the right lower lobe. Borderline cardiomegaly. Hepatobiliary: No focal liver abnormality is seen. Status post cholecystectomy. No biliary dilatation. Pancreas: Unremarkable. No pancreatic ductal dilatation or surrounding inflammatory changes. Spleen: Normal in size without focal abnormality. Adrenals/Urinary Tract: Adrenal glands are within normal limits. Atrophic native  kidneys containing multiple stones. Largest on the left is seen in the lower pole and measures 9 mm. Multiple stones in the right kidney measuring up to 8 mm. No hydronephrosis. 1 cm hypodensity lower pole left kidney, possibly a cyst  but further evaluation limited without contrast. Bladder nearly empty. Soft tissue density with calcification and fat density anterior to the right psoas consistent with scarred transplanted kidney. Stomach/Bowel: The stomach is slightly enlarged. No dilated small bowel. Diffuse colon wall thickening with collapsed appearance of the colon. Normal appendix. Vascular/Lymphatic: Aortic atherosclerosis. No enlarged abdominal or pelvic lymph nodes. Reproductive: Prostate is slightly enlarged. Other: Negative for free air or free fluid. Musculoskeletal: No acute or significant osseous findings. IMPRESSION: 1. Negative for bowel obstruction or ascites 2. Diffuse colon wall thickening consistent with history of C difficile colitis. 3. Atrophic native kidneys and scarred appearing right pelvic transplanted kidney. 4. Small ground-glass nodular density in the right lower lobe with centrilobular densities in both lower lobes in the right middle lobe consistent with respiratory infection, to include atypical entities. 5. Atrophic native kidneys containing multiple stones. Negative for hydronephrosis. Electronically Signed   By: Donavan Foil M.D.   On: 11/05/2016 20:43   Dg Abd 1 View  Result Date: 11/04/2016 CLINICAL DATA:  Abdominal pain.  Celsius diff colitis. EXAM: ABDOMEN - 1 VIEW COMPARISON:  Body CT 07/02/2014 FINDINGS: The bowel gas pattern is normal. No radio-opaque calculi or other significant radiographic abnormality are seen. Postsurgical changes in the right upper and lower quadrants. IMPRESSION: Nonobstructive bowel gas pattern. Electronically Signed   By: Fidela Salisbury M.D.   On: 11/04/2016 17:08        Scheduled Meds: . amLODipine  10 mg Oral Daily  . aspirin EC   81 mg Oral Daily  . calcitRIOL  0.25 mcg Oral Q M,W,F-HD  . calcitRIOL  1.25 mcg Oral Once per day on Mon Wed Fri  . cholecalciferol  1,000 Units Oral Daily  . clonazePAM  0.5 mg Oral BID  . famotidine  20 mg Oral Q24H  . feeding supplement (PRO-STAT SUGAR FREE 64)  30 mL Oral BID  . guaiFENesin  600 mg Oral BID  . hydrALAZINE  50 mg Oral Q8H  . insulin aspart  0-9 Units Subcutaneous TID WC  . ipratropium-albuterol  3 mL Nebulization BID  . mouth rinse  15 mL Mouth Rinse BID  . metoprolol tartrate  75 mg Oral BID  . mometasone-formoterol  2 puff Inhalation BID  . multivitamin  1 tablet Oral QHS  . pravastatin  40 mg Oral QPM  . sevelamer carbonate  2,400 mg Oral TID WC  . vancomycin  125 mg Oral Q6H   Continuous Infusions: . piperacillin-tazobactam (ZOSYN)  IV Stopped (11/06/16 0219)  . vancomycin Stopped (11/05/16 1249)     LOS: 4 days        Aline August, MD [facility] Pager [phone]  If 7PM-7AM, please contact night-coverage [email or web address] Password [email or web address] 11/06/2016, 9:16 AM

## 2016-11-06 NOTE — Progress Notes (Signed)
Upon entering room patient was already on CPAP, resting comfortably. Will monitor as needed.

## 2016-11-06 NOTE — Progress Notes (Signed)
Pt is alert and oriented times 4, during starting of the shift CT abdomen and pelvis done, pt was coughing throughout the night, Robitussin PO given, besides Neb treatment given via RRT, pt was still complaining of SOB, RN checked oxygen saturation which was 90% @2l . RN requested patient to use CPAP, pt is sleeping this moment with CPAP . Pt's wife and daughter in bed side and is updating

## 2016-11-06 NOTE — Plan of Care (Signed)
Problem: Nutritional: Goal: Maintenance of adequate nutrition will improve Outcome: Not Progressing Have poor appetite less than 25% for all 3 meals

## 2016-11-06 NOTE — Consult Note (Signed)
[facility] Consult: 12:14 PM 11/06/2016  LOS: 4 days    Referring Provider: Dr Starla Link  Primary Care Physician:  Gayland Curry, DO Primary Gastroenterologist:  Dr. Ardis Hughs     Reason for Consultation:  colitis   HPI: Kirk [name] is a 56 y.o. male.  Hx ESRD, HD MWF.  Anemia of chronic kidney dz.  OSA on CPAP.  Anxiety.      05/2007 Colonoscopy. Multiple polyps, all adenomas without HGD  10/2007 EGD for anemia.  Mild gastritis, duodenitis H. pylori positive on biopsy. 06/2011 EGD.  Gastritis, duodenitis.  Path mild chronic inflammation with focal metaplasia. No H pylori.   06/2011 Colonoscopy.  Multiple polyps removed, all tubular adenomas with no HGD.   02/2014 bloody diarrhea.  Negative path panel and fecal leukocytes. 09/2015 non-bloody diarrhea.  C diff + on stool path panel but negative C diff on c diff panel.  Treated with 10 days oral vancomycin.   08/2016  Colonoscopy.  3 polyps removed. Adenomatous, no HGD   Admission 8/22 - 09/22/16 with volume overload and possible HCAP, treated with rocephin and ceftin,  discharged on Keflex Bright, red bloody diarrhea started ~ 10/1.   + nausea. C diff Ag and tocigennic C diff +, started oral vanc 125 mg qid x 2 weeks on 10/9.  Had taken only 1 dose when  admitted to [facility] 10/10 - 10/11 for acute diastolic heart failure, persistent bloody diarrhea up to 20 episodes per day, cough, dyspnea.  Discharged on oral vancomycin.    Unable to dialyze on 10/12 due to no electricity at dialysis center.  Admitted 10/12 to [facility] with temp 102, hypoxia, tachypnea.  Volume overload.  Bloody diarrhea, nausea persists.  Vancomycin continued, IV flagyl added today.  Also getting Zosyn for the fever.  RR team called last night after pt "collapsed" and noted lethargic and  slow to respond.  CT chest this afternoon, results pending.   11/04/16 KUB.  Unremarkable.  10/15 CT abd/pelvis without contrast: Diffuse colon wall thickening consistent with history of C difficile colitis.  RLL nodularity and central densities in bil lower lobes and right middle lobe.    Leukopenia (3.1) and pancytopenia (50s-60s) noted.  Hgbs 10.4 - 11.9.  MCV normal.  Coags normal.    Pt not seeing as much blood lately but admits to not really scrutinizing BMs.  Intermittent, non-focal mid abdominal pain persists. Poor appetite. Occasional nausea persists. Patient not able to vomit.  Felling panicky. Prolonged bleeding from left AV fistula after dialysis reported        Past Medical History:  Diagnosis Date  . Acute edema of lung, unspecified   . Acute, but ill-defined, cerebrovascular disease   . Allergy   . Anemia   . 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)   . Anxiety   . Asthma   . Carpal tunnel syndrome   . Cellulitis and abscess of trunk   . Cholelithiasis 07/13/2014  . Chronic headaches   . Debility, unspecified   . Dermatophytosis of the body   .  Dysrhythmia    history of  . Edema   . End stage renal disease on dialysis [facility])    "MWF; [facility]" (10/21/2014)  . Essential hypertension, benign   . GERD (gastroesophageal reflux disease)   . Gout, unspecified   . HTN (hypertension)   . Hypertrophy of prostate without urinary obstruction and other lower urinary tract symptoms (LUTS)   . Hypotension, unspecified   . Impotence of organic origin   . Insomnia, unspecified   . Kidney replaced by transplant   . Localization-related (focal) (partial) epilepsy and epileptic syndromes with complex partial seizures, without mention of intractable epilepsy   . Lumbago   . Memory loss   . OSA on CPAP   . Other and unspecified hyperlipidemia   . Other chronic nonalcoholic liver disease   . Other malaise and fatigue   . Other nonspecific abnormal serum  enzyme levels   . Pain in joint, lower leg   . Pain in joint, upper arm   . Pneumonia "several times"  . Renal dialysis status(V45.11) 02/05/2010   restarted 01/02/13 ofter renal trransplant failure  . Secondary hyperparathyroidism (of renal origin)   . Shortness of breath   . Sleep apnea   . Tension headache   . Unspecified constipation   . Unspecified essential hypertension   . Unspecified hereditary and idiopathic peripheral neuropathy   . Unspecified vitamin D deficiency     Past Surgical History:  Procedure Laterality Date  . AV FISTULA PLACEMENT Left ?2010   "forearm; at [facility]"  . BACK SURGERY    . CARDIAC CATHETERIZATION  03/21/2011  . CHOLECYSTECTOMY N/A 10/21/2014   Procedure: LAPAROSCOPIC CHOLECYSTECTOMY WITH INTRAOPERATIVE CHOLANGIOGRAM;  Surgeon: Autumn Messing III, MD;  Location: [place];  Service: General;  Laterality: N/A;  . COLONOSCOPY    . 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 KIDNEY TRANSPLANT  08/17/2011   [place]  10/21/2014   w/IOC  . LEFT HEART CATHETERIZATION WITH CORONARY ANGIOGRAM N/A 03/21/2011   Procedure: LEFT HEART CATHETERIZATION WITH CORONARY ANGIOGRAM;  Surgeon: [name], MD;  Location: [facility] C[facility] LAB;  Service: Cardiovascular;  Laterality: N/A;  . NEPHRECTOMY  08/2013   removed transplaned kidney  . POSTERIOR FUSION CERVICAL SPINE  06/25/2012   for spinal stenosis  . VASECTOMY  2010    Prior to Admission medications   Medication Sig Start Date End Date Taking? Authorizing Provider  acetaminophen (TYLENOL) 500 MG tablet Take 1 tablet (500 mg total) by mouth every 6 (six) hours as needed for moderate pain. 09/12/14   Samuella Cota, MD  amLODipine (NORVASC) 10 MG tablet One twice daily to control BP 06/21/16   Lauree Chandler, NP  aspirin EC 81 MG tablet Take 81 mg by mouth daily.    [provider]  b complex-vitamin c-folic acid (NEPHRO-VITE) 0.8 MG TABS tablet  TAKE 1 TABLET BY MOUTH EVERY DAY 03/29/16   Estill Dooms, MD  budesonide-formoterol [facility]) 80-4.5 MCG/ACT inhaler Inhale 2 puffs into the lungs 2 (two) times daily. 09/21/14   Bonnielee Haff, MD  calcitRIOL (ROCALTROL) 0.25 MCG capsule Take 1 capsule (0.25 mcg total) by mouth every Monday, Wednesday, and Friday with hemodialysis. 09/21/14   Bonnielee Haff, MD  Cholecalciferol (VITAMIN D3) 5000 UNITS TABS Take 1 tablet by mouth daily.    [provider]  clonazePAM (KLONOPIN) 0.5 MG tablet Take 1 tablet (0.5 mg total) by mouth  2 (two) times daily. 09/22/16   Orvan Falconer, MD  diphenhydrAMINE (BENADRYL) 25 MG tablet Take 25 mg by mouth at bedtime as needed for allergies.    [provider]  guaiFENesin (MUCINEX) 600 MG 12 hr tablet Take 600 mg by mouth 2 (two) times daily.    [provider]  guaiFENesin-dextromethorphan (ROBITUSSIN DM) 100-10 MG/5ML syrup Take 10 mLs by mouth every 4 (four) hours as needed for cough.    [provider]  hydrALAZINE (APRESOLINE) 50 MG tablet Take 1 tablet (50 mg total) by mouth every 8 (eight) hours. 09/22/16   Orvan Falconer, MD  metoCLOPramide (REGLAN) 10 MG tablet Take 10 mg by mouth as needed for nausea.    [provider]  metoprolol tartrate (LOPRESSOR) 50 MG tablet Take One and a half tablet by mouth twice daily 10/22/16   Reed, Tiffany L, DO  omeprazole (PRILOSEC) 20 MG capsule TAKE 1 CAPSULE (20 MG TOTAL) BY MOUTH 2 (TWO) TIMES DAILY BEFORE A MEAL. 09/10/16   Milus Banister, MD  ondansetron (ZOFRAN ODT) 4 MG disintegrating tablet Take 1 tablet (4 mg total) by mouth every 8 (eight) hours as needed for nausea or vomiting. 10/26/16   Kinnie Feil, PA-C  pravastatin (PRAVACHOL) 40 MG tablet Take 1 tablet (40 mg total) by mouth every evening. 06/21/16   Lauree Chandler, NP  sevelamer carbonate (RENVELA) 800 MG tablet Take 3 tablets (2,400 mg total) by mouth 3 (three) times daily with meals. 09/22/16   Orvan Falconer, MD    traMADol (ULTRAM) 50 MG tablet Take 1 tablet (50 mg total) by mouth daily as needed (severe headache). 10/08/16   Reed, Tiffany L, DO  vancomycin (VANCOCIN) 125 MG capsule Take 1 capsule (125 mg total) by mouth 4 (four) times daily. 11/01/16 11/15/16  Oswald Hillock, MD    Scheduled Meds: . amLODipine  10 mg Oral Daily  . aspirin EC  81 mg Oral Daily  . calcitRIOL  0.25 mcg Oral Q M,W,F-HD  . calcitRIOL  1.25 mcg Oral Once per day on Mon Wed Fri  . cholecalciferol  1,000 Units Oral Daily  . clonazePAM  0.5 mg Oral BID  . famotidine  20 mg Oral Q24H  . feeding supplement (PRO-STAT SUGAR FREE 64)  30 mL Oral BID  . guaiFENesin  600 mg Oral BID  . hydrALAZINE  50 mg Oral Q8H  . insulin aspart  0-9 Units Subcutaneous TID WC  . ipratropium-albuterol  3 mL Nebulization BID  . mouth rinse  15 mL Mouth Rinse BID  . metoprolol tartrate  75 mg Oral BID  . mometasone-formoterol  2 puff Inhalation BID  . multivitamin  1 tablet Oral QHS  . pravastatin  40 mg Oral QPM  . sevelamer carbonate  2,400 mg Oral TID WC  . vancomycin  500 mg Oral Q6H   Infusions: . azithromycin    . metronidazole    . piperacillin-tazobactam (ZOSYN)  IV Stopped (11/06/16 0219)   PRN Meds: acetaminophen **OR** acetaminophen, diphenhydrAMINE, guaiFENesin-dextromethorphan, HYDROcodone-acetaminophen, ipratropium-albuterol, metoCLOPramide, ondansetron **OR** ondansetron (ZOFRAN) IV, traMADol   Allergies as of 11/02/2016 - Review Complete 10/31/2016  Allergen Reaction Noted  . Codeine Nausea And Vomiting and Nausea Only 05/20/2007    Family History  Problem Relation Age of Onset  . Adopted: Yes  . Colon cancer Neg Hx   . Esophageal cancer Neg Hx   . Rectal cancer Neg Hx   . Stomach cancer Neg Hx  Social History   Social History  . Marital status: Married    Spouse name: [name]  . Number of children: 3  . Years of education: N/A   Occupational History  . disabled Disabled   Social History Main Topics   . Smoking status: Current Every Day Smoker    Packs/day: 0.50    Years: 32.00    Types: E-cigarettes    Last attempt to quit: 08/23/2014  . Smokeless tobacco: Never Used  . Alcohol use No  . Drug use: No  . Sexual activity: Yes   Other Topics Concern  . Not on file   Social History Narrative   Drinks 1 cup of caffeine daily.    REVIEW OF SYSTEMS: Constitutional:  Feels weak. ENT:  No nose bleeds Pulm:  oughing up yellow sputum. Feels short of breath. CV:  No palpitations, no LE edema.  GU:  No hematuria, no frequency GI:  Per HPI Heme:  No excessive or unusual bleeding other than what he has seen in his stools.   Transfusions:  Patient not aware of any previous blood transfusions. Neuro:  No headaches, no peripheral tingling or numbness Derm:  No itching, no rash or sores.  Endocrine:  No sweats or chills.  No polyuria or dysuria Immunization:  Record reviewed.  No current flu shot listed Travel:  None beyond local counties in last few months.    PHYSICAL EXAM: Vital signs in last 24 hours: Vitals:   11/06/16 0717 11/06/16 0808  BP: (!) 149/83   Pulse: 70    Resp: (!) 22   MAXIMUM Tmax 100.7, currently 98.6 F    SpO2: 90% 93% 92%   Wt Readings from Last 3 Encounters:  11/06/16 96.9 kg (213 lb 11.2 oz)  11/01/16 99.3 kg (218 lb 14.7 oz)  10/26/16 101.6 kg (224 lb)    General: obese.  Ill appearing asian man who is both somewhat lethargic and anxious. Head:  No signs of head trauma.  Eyes:  No scleral icterus. No conjunctival pallor. Ears:  Hearing intact.  Nose:  No discharge or congestion. He is holding the CPAP mask a few inches in front of his face Mouth:  Oropharynx moist and clear. Tongue midline. Neck:  No masses, no thyromegaly. No JVD. Lungs:  Diminished breath sounds in the lower half to lower two thirds of the lungs bilaterally. Some expiratory wheezing. Wet cough productive of clear sputum.  Labored breathing Heart: RRR. Abdomen:  Obese. Active  bowel sounds. No HSM, masses, bruits. Not tender..   Rectal: deferred rectal exam.   Musc/Skeltl: no joint erythema or swelling. No gross joint deformities. Extremities:  No CCE.  Neurologic:  . Drowsy but conversant. Oriented 3. Moves all 4 limbs. No tremor. Limb strength not tested. Skin:  No rashes or sores.   Psych:  Anxious.  Intake/Output from previous day: 10/15 0701 - 10/16 0700 In: 400 [P.O.:100; IV Piggyback:300] Out: 1000  Intake/Output this shift: Total I/O In: 24065 [P.O.:24065] Out: -   LAB RESULTS:  Recent Labs  11/04/16 0420 11/05/16 0509 11/06/16 0500  WBC 4.6 3.1* 3.3*  HGB 11.7* 10.4* 11.6*  HCT 35.8* 32.9* 36.1*  PLT 65* 59* 65*   BMET Lab Results  Component Value Date   NA 134 (L) 11/06/2016   NA 135 11/05/2016   NA 136 11/04/2016   K 4.5 11/06/2016   K 4.2 11/05/2016   K 4.2 11/04/2016   CL 94 (L) 11/06/2016   CL 94 (L) 11/05/2016  CL 95 (L) 11/04/2016   CO2 26 11/06/2016   CO2 22 11/05/2016   CO2 25 11/04/2016   GLUCOSE 99 11/06/2016   GLUCOSE 102 (H) 11/05/2016   GLUCOSE 105 (H) 11/04/2016   BUN 42 (H) 11/06/2016   BUN 80 (H) 11/05/2016   BUN 63 (H) 11/04/2016   CREATININE 13.19 (H) 11/06/2016   CREATININE 18.16 (H) 11/05/2016   CREATININE 15.35 (H) 11/04/2016   CALCIUM 8.4 (L) 11/06/2016   CALCIUM 7.7 (L) 11/05/2016   CALCIUM 7.9 (L) 11/04/2016   LFT  Recent Labs  11/04/16 0420  PROT 6.9  ALBUMIN 3.3*  AST 26  ALT 22  ALKPHOS 79  BILITOT 1.1   PT/INR Lab Results  Component Value Date   INR 1.06 11/02/2016   INR 0.91 06/23/2014   INR 1.00 06/03/2014   Hepatitis Panel No results for input(s): HEPBSAG, HCVAB, HEPAIGM, HEPBIGM in the last 72 hours. C-Diff No components found for: CDIFF Lipase     Component Value Date/Time   LIPASE 74 (H) 11/02/2016 0801    Drugs of Abuse  No results found for: LABOPIA, COCAINSCRNUR, LABBENZ, AMPHETMU, THCU, LABBARB   RADIOLOGY STUDIES: Ct Abdomen Pelvis Wo  Contrast  Result Date: 11/05/2016 CLINICAL DATA:  Abdomen distension EXAM: CT ABDOMEN AND PELVIS WITHOUT CONTRAST TECHNIQUE: Multidetector CT imaging of the abdomen and pelvis was performed following the standard protocol without IV contrast. COMPARISON:  11/04/2016, 07/02/2014, 02/17/2014, 09/08/2013 FINDINGS: Lower chest: Lung bases demonstrate no consolidation or effusion. Mild centrilobular density within the bilateral lower lobes and right middle lobe. Small ground-glass nodular density in the right lower lobe. Borderline cardiomegaly. Hepatobiliary: No focal liver abnormality is seen. Status post cholecystectomy. No biliary dilatation. Pancreas: Unremarkable. No pancreatic ductal dilatation or surrounding inflammatory changes. Spleen: Normal in size without focal abnormality. Adrenals/Urinary Tract: Adrenal glands are within normal limits. Atrophic native kidneys containing multiple stones. Largest on the left is seen in the lower pole and measures 9 mm. Multiple stones in the right kidney measuring up to 8 mm. No hydronephrosis. 1 cm hypodensity lower pole left kidney, possibly a cyst but further evaluation limited without contrast. Bladder nearly empty. Soft tissue density with calcification and fat density anterior to the right psoas consistent with scarred transplanted kidney. Stomach/Bowel: The stomach is slightly enlarged. No dilated small bowel. Diffuse colon wall thickening with collapsed appearance of the colon. Normal appendix. Vascular/Lymphatic: Aortic atherosclerosis. No enlarged abdominal or pelvic lymph nodes. Reproductive: Prostate is slightly enlarged. Other: Negative for free air or free fluid. Musculoskeletal: No acute or significant osseous findings. IMPRESSION: 1. Negative for bowel obstruction or ascites 2. Diffuse colon wall thickening consistent with history of C difficile colitis. 3. Atrophic native kidneys and scarred appearing right pelvic transplanted kidney. 4. Small  ground-glass nodular density in the right lower lobe with centrilobular densities in both lower lobes in the right middle lobe consistent with respiratory infection, to include atypical entities. 5. Atrophic native kidneys containing multiple stones. Negative for hydronephrosis. Electronically Signed   By: Donavan Foil M.D.   On: 11/05/2016 20:43   Dg Abd 1 View  Result Date: 11/04/2016 CLINICAL DATA:  Abdominal pain.  Celsius diff colitis. EXAM: ABDOMEN - 1 VIEW COMPARISON:  Body CT 07/02/2014 FINDINGS: The bowel gas pattern is normal. No radio-opaque calculi or other significant radiographic abnormality are seen. Postsurgical changes in the right upper and lower quadrants. IMPRESSION: Nonobstructive bowel gas pattern. Electronically Signed   By: Fidela Salisbury M.D.   On: 11/04/2016 17:08  IMPRESSION:   * Bloody diarrhea. Tested C. Difficile +10/5 and initiated on oral vancomycin on 10/9.  Day 1 IV flagyl. CT shows diffuse colitis.   C diff not normally associated with bloody diarrhea, makes you wonder about superimposed ischemic colitis, especially with pt c/o abdominal pain, cramps.   Bouts of bloody stool  Date back to 2016.  On Colonoscopies has consistently had adenomatous polyps but never colitis.    *  Diastolic CHF and lung densities, hypoxia, ? HCAP.  CT chest completed, reading to follow.    *  Fevers.    *  Thrombocytopenia, recurrent issue.      PLAN:     *  resp status seems to tenuous to pursue flex sig or colonoscopy.      Azucena Freed  11/06/2016, 12:14 PM Pager: [phone]    ________________________________________________________________________  [facility] MD note:  I personally examined the patient, reviewed the data and agree with the assessment and plan described above.  Difficult historian: he said diarrhea all day, then RN said 2 stools only, then he replied "it's getting better, probably only 3 stools today."  He is on a NRB mask currently.  He  had c. diffi toxin +, correct clinical symtoms and so started vancomycin orally (7 days so far).  Possibly another process but he is not safe for endoscopic procedures given his resp status.  I agree with adding flagyl IV and will follow him clinically.  Will be very important that we get an accurate count of all stools, I'm not sure he is so reliable.   Will follow along.  Owens Loffler, MD [facility] Pager [phone]

## 2016-11-06 NOTE — Plan of Care (Signed)
Problem: Tissue Perfusion: Goal: Risk factors for ineffective tissue perfusion will decrease Outcome: Progressing Pt is warm dry with cap refill less than 3 secs o2 sat 92%

## 2016-11-06 NOTE — Progress Notes (Signed)
Patient did not have any urine output in night, unable to collect sample, have one episode of loose stool Breathing is vast improved with CPAP SPO2 is 99 with CPAP

## 2016-11-07 ENCOUNTER — Inpatient Hospital Stay (HOSPITAL_COMMUNITY): Payer: Medicare Other

## 2016-11-07 DIAGNOSIS — R1084 Generalized abdominal pain: Secondary | ICD-10-CM

## 2016-11-07 DIAGNOSIS — E871 Hypo-osmolality and hyponatremia: Secondary | ICD-10-CM

## 2016-11-07 DIAGNOSIS — A0472 Enterocolitis due to Clostridium difficile, not specified as recurrent: Secondary | ICD-10-CM

## 2016-11-07 DIAGNOSIS — R0603 Acute respiratory distress: Secondary | ICD-10-CM

## 2016-11-07 DIAGNOSIS — R5382 Chronic fatigue, unspecified: Secondary | ICD-10-CM

## 2016-11-07 LAB — BLOOD GAS, ARTERIAL
ACID-BASE DEFICIT: 1.2 mmol/L (ref 0.0–2.0)
Acid-Base Excess: 2 mmol/L (ref 0.0–2.0)
BICARBONATE: 25.7 mmol/L (ref 20.0–28.0)
Bicarbonate: 23.4 mmol/L (ref 20.0–28.0)
DELIVERY SYSTEMS: POSITIVE
DELIVERY SYSTEMS: POSITIVE
DRAWN BY: 345601
Drawn by: 51133
Expiratory PAP: 8
Expiratory PAP: 8
FIO2: 60
FIO2: 50
INSPIRATORY PAP: 18
INSPIRATORY PAP: 18
LHR: 12 {breaths}/min
LHR: 8 {breaths}/min
MODE: POSITIVE
O2 Saturation: 87 %
O2 Saturation: 86.8 %
PH ART: 7.365 (ref 7.350–7.450)
PO2 ART: 55 mmHg — AB (ref 83.0–108.0)
Patient temperature: 98
Patient temperature: 98.6
pCO2 arterial: 41.8 mmHg (ref 32.0–48.0)
pCO2 arterial: 38.3 mmHg (ref 32.0–48.0)
pH, Arterial: 7.442 (ref 7.350–7.450)
pO2, Arterial: 62.2 mmHg — ABNORMAL LOW (ref 83.0–108.0)

## 2016-11-07 LAB — COMPREHENSIVE METABOLIC PANEL
ALBUMIN: 3.2 g/dL — AB (ref 3.5–5.0)
ALK PHOS: 72 U/L (ref 38–126)
ALT: 37 U/L (ref 17–63)
ANION GAP: 18 — AB (ref 5–15)
AST: 33 U/L (ref 15–41)
BILIRUBIN TOTAL: 1.3 mg/dL — AB (ref 0.3–1.2)
BUN: 59 mg/dL — ABNORMAL HIGH (ref 6–20)
CALCIUM: 7.9 mg/dL — AB (ref 8.9–10.3)
CO2: 21 mmol/L — AB (ref 22–32)
CREATININE: 15.82 mg/dL — AB (ref 0.61–1.24)
Chloride: 93 mmol/L — ABNORMAL LOW (ref 101–111)
GFR calc non Af Amer: 3 mL/min — ABNORMAL LOW (ref >60)
GFR, EST AFRICAN AMERICAN: 3 mL/min — AB (ref >60)
GLUCOSE: 120 mg/dL — AB (ref 65–99)
Potassium: 5 mmol/L (ref 3.5–5.1)
SODIUM: 132 mmol/L — AB (ref 135–145)
TOTAL PROTEIN: 7.1 g/dL (ref 6.5–8.1)

## 2016-11-07 LAB — OVA + PARASITE EXAM

## 2016-11-07 LAB — O&P RESULT

## 2016-11-07 LAB — CBC WITH DIFFERENTIAL/PLATELET
Basophils Absolute: 0 10*3/uL (ref 0.0–0.1)
Basophils Relative: 1 %
EOS PCT: 1 %
Eosinophils Absolute: 0.1 10*3/uL (ref 0.0–0.7)
HCT: 35.7 % — ABNORMAL LOW (ref 39.0–52.0)
HEMOGLOBIN: 12 g/dL — AB (ref 13.0–17.0)
LYMPHS ABS: 0.5 10*3/uL — AB (ref 0.7–4.0)
LYMPHS PCT: 10 %
MCH: 31.2 pg (ref 26.0–34.0)
MCHC: 33.6 g/dL (ref 30.0–36.0)
MCV: 92.7 fL (ref 78.0–100.0)
Monocytes Absolute: 0.5 10*3/uL (ref 0.1–1.0)
Monocytes Relative: 9 %
NEUTROS PCT: 80 %
Neutro Abs: 4.5 10*3/uL (ref 1.7–7.7)
Platelets: 74 10*3/uL — ABNORMAL LOW (ref 150–400)
RBC: 3.85 MIL/uL — AB (ref 4.22–5.81)
RDW: 14.4 % (ref 11.5–15.5)
WBC: 5.6 10*3/uL (ref 4.0–10.5)

## 2016-11-07 LAB — CULTURE, BLOOD (ROUTINE X 2)
CULTURE: NO GROWTH
CULTURE: NO GROWTH
Culture: NO GROWTH
SPECIAL REQUESTS: ADEQUATE
SPECIAL REQUESTS: ADEQUATE
Special Requests: ADEQUATE

## 2016-11-07 LAB — TROPONIN I: Troponin I: <0.03 ng/mL (ref <0.03)

## 2016-11-07 LAB — LEGIONELLA PNEUMOPHILA SEROGP 1 UR AG: L. pneumophila Serogp 1 Ur Ag: NEGATIVE

## 2016-11-07 LAB — RESPIRATORY PANEL BY PCR
ADENOVIRUS-RVPPCR: NOT DETECTED
BORDETELLA PERTUSSIS-RVPCR: NOT DETECTED
CHLAMYDOPHILA PNEUMONIAE-RVPPCR: NOT DETECTED
CORONAVIRUS 229E-RVPPCR: NOT DETECTED
CORONAVIRUS HKU1-RVPPCR: NOT DETECTED
Coronavirus NL63: NOT DETECTED
Coronavirus OC43: NOT DETECTED
Influenza A: NOT DETECTED
Influenza B: NOT DETECTED
Metapneumovirus: NOT DETECTED
Mycoplasma pneumoniae: NOT DETECTED
PARAINFLUENZA VIRUS 2-RVPPCR: NOT DETECTED
Parainfluenza Virus 1: NOT DETECTED
Parainfluenza Virus 3: NOT DETECTED
Parainfluenza Virus 4: NOT DETECTED
Respiratory Syncytial Virus: NOT DETECTED
Rhinovirus / Enterovirus: NOT DETECTED

## 2016-11-07 LAB — GLUCOSE, CAPILLARY
GLUCOSE-CAPILLARY: 123 mg/dL — AB (ref 65–99)
Glucose-Capillary: 100 mg/dL — ABNORMAL HIGH (ref 65–99)

## 2016-11-07 LAB — URINE CULTURE: Culture: NO GROWTH

## 2016-11-07 LAB — MAGNESIUM: Magnesium: 2.3 mg/dL (ref 1.7–2.4)

## 2016-11-07 MED ORDER — ALTEPLASE 2 MG IJ SOLR: 2.0000 mg | Freq: Once | INTRAMUSCULAR | Status: DC | PRN

## 2016-11-07 MED ORDER — PENTAFLUOROPROP-TETRAFLUOROETH EX AERO: 1.0000 "application " | INHALATION_SPRAY | CUTANEOUS | Status: DC | PRN

## 2016-11-07 MED ORDER — SODIUM CHLORIDE 0.9 % IV SOLN: 100.0000 mL | INTRAVENOUS | Status: DC | PRN

## 2016-11-07 MED ORDER — LIDOCAINE HCL (PF) 1 % IJ SOLN: 5.0000 mL | INTRAMUSCULAR | Status: DC | PRN

## 2016-11-07 MED ORDER — DEXTROSE 5 % IV SOLN
500.0000 mg | INTRAVENOUS | Status: DC
  Administered 2016-11-07 – 2016-11-10 (×4): 500 mg via INTRAVENOUS
  Filled 2016-11-07 (×4): qty 500

## 2016-11-07 MED ORDER — HEPARIN SODIUM (PORCINE) 1000 UNIT/ML DIALYSIS: 1000.0000 [IU] | INTRAMUSCULAR | Status: DC | PRN

## 2016-11-07 MED ORDER — LORAZEPAM 2 MG/ML IJ SOLN
1.0000 mg | Freq: Once | INTRAMUSCULAR | Status: AC
  Administered 2016-11-07: 1 mg via INTRAVENOUS
  Filled 2016-11-07: qty 1

## 2016-11-07 MED ORDER — SODIUM CHLORIDE 0.9 % IV SOLN
100.0000 mL | INTRAVENOUS | Status: DC | PRN
Start: 2016-11-07 — End: 2016-11-07

## 2016-11-07 MED ORDER — ZOLPIDEM TARTRATE 5 MG PO TABS
5.0000 mg | ORAL_TABLET | Freq: Once | ORAL | Status: AC
  Administered 2016-11-07: 5 mg via ORAL
  Filled 2016-11-07: qty 1

## 2016-11-07 MED ORDER — HYDROCODONE-ACETAMINOPHEN 5-325 MG PO TABS
ORAL_TABLET | ORAL | Status: AC
  Filled 2016-11-07: qty 1

## 2016-11-07 MED ORDER — CALCITRIOL 0.25 MCG PO CAPS
ORAL_CAPSULE | ORAL | Status: AC
  Filled 2016-11-07: qty 1

## 2016-11-07 MED ORDER — PIPERACILLIN-TAZOBACTAM 3.375 G IVPB
3.3750 g | Freq: Two times a day (BID) | INTRAVENOUS | Status: DC
  Administered 2016-11-07 – 2016-11-11 (×8): 3.375 g via INTRAVENOUS
  Filled 2016-11-07 (×9): qty 50

## 2016-11-07 MED ORDER — LIDOCAINE-PRILOCAINE 2.5-2.5 % EX CREA
1.0000 | TOPICAL_CREAM | CUTANEOUS | Status: DC | PRN
Start: 2016-11-07 — End: 2016-11-07

## 2016-11-07 MED ORDER — LIDOCAINE-PRILOCAINE 2.5-2.5 % EX CREA: 1.0000 "application " | TOPICAL_CREAM | CUTANEOUS | Status: DC | PRN

## 2016-11-07 MED ORDER — IPRATROPIUM-ALBUTEROL 0.5-2.5 (3) MG/3ML IN SOLN
3.0000 mL | Freq: Four times a day (QID) | RESPIRATORY_TRACT | Status: DC
  Administered 2016-11-07 – 2016-11-11 (×13): 3 mL via RESPIRATORY_TRACT
  Filled 2016-11-07 (×14): qty 3

## 2016-11-07 NOTE — Progress Notes (Signed)
Increased O2 on bipap to 60% per ABG.

## 2016-11-07 NOTE — Progress Notes (Addendum)
Asked by RT to assess patient for increased work of breathing and shortness of breath.  Upon arrival, patient was sitting on the side of bed, leaning on the bedside table, tripod position as times, RR 35-40. Patient was short of breath, + accessory muscle use, was on CPAP, + wheezing, + diminished lung sounds overall, + fine crackles. Sats were in the low 80s, patient was transitioned to NRB 15L and sats improved > 93% but work of breathing did not improve. HR and BP were stable, + diaphoretic. Mental status was intact, patient was anxious/restless at times.   I did page the [facility] NP, [place] MD will come assess patient.  Interventions: -- RT administered DUONEB (patient has received all scheduled and PRN treatments x 2). -- CXR, ABG, BIPAP -- Transferred to 2C11 and RT placed patient on BIPAP, patient quickly improved, RR improved from upper 30s - low 40s to low 20s. Patient stated that he felt better and that his breathing improved.   TRH MD and [facility] NP asked that Nephrology MD be contacted to see if HD can be scheduled earlier today.  I updated the Nephrology MD on the status of the patient, his respiratory status decline and that the plan is to place the patient on BIPAP. I did update the TRH NP and TRH MD about Nephrology being notified.   End Time 415

## 2016-11-07 NOTE — Progress Notes (Signed)
[facility] Progress Note  Subjective: episode of SOB and hypoxia early this am.  Repeat CXR looks more wet/ CHF .  CT yest had some focal opacities and also some background GG changes.    Vitals:   11/07/16 0714 11/07/16 0820 11/07/16 0850 11/07/16 0855  BP: (!) 162/81 (!) 142/74 134/71 (!) 143/77  Pulse: 67 68 74 74  Resp: (!) 22 (!) 22 (!) 21 20  Temp:  (!) 97.5 F (36.4 C)    TempSrc:  Axillary    SpO2: 97% 98% 98% 98%  Weight:      Height:        Inpatient medications: . amLODipine  10 mg Oral Daily  . aspirin EC  81 mg Oral Daily  . calcitRIOL  0.25 mcg Oral Q M,W,F-HD  . calcitRIOL  1.25 mcg Oral Once per day on Mon Wed Fri  . cholecalciferol  1,000 Units Oral Daily  . clonazePAM  0.5 mg Oral BID  . famotidine  20 mg Oral Q24H  . feeding supplement (PRO-STAT SUGAR FREE 64)  30 mL Oral BID  . guaiFENesin  600 mg Oral BID  . hydrALAZINE  50 mg Oral Q8H  . insulin aspart  0-9 Units Subcutaneous TID WC  . ipratropium-albuterol  3 mL Nebulization QID  . mouth rinse  15 mL Mouth Rinse BID  . metoprolol tartrate  75 mg Oral BID  . mometasone-formoterol  2 puff Inhalation BID  . multivitamin  1 tablet Oral QHS  . pravastatin  40 mg Oral QPM  . sevelamer carbonate  2,400 mg Oral TID WC  . vancomycin  500 mg Oral Q6H   . azithromycin Stopped (11/07/16 0006)  . metronidazole Stopped (11/07/16 0547)  . piperacillin-tazobactam (ZOSYN)  IV Stopped (11/07/16 0419)   acetaminophen **OR** acetaminophen, diphenhydrAMINE, guaiFENesin-dextromethorphan, HYDROcodone-acetaminophen, ipratropium-albuterol, metoCLOPramide, ondansetron **OR** ondansetron (ZOFRAN) IV, traMADol  Exam: Alert, no distress, not toxic   no jvd Chest coarse BS bilat, on bipap RRR no mrg Abd soft ntnd no ascites Ext no edema or wounds NF, ox 3 LFA AVF +thrill  CXR - no active disease   Dialysis: MWF [facility] ([facility]) 4h 75min  100kg  2/2  Hep 4000  LFA AVF - Calcitriol 1.80mcg PO q  HD - Venofer 50mg  IV weekly - BMM meds: Phoslo 4/meals      Impression: 1. SOB - suspect vol overload, CXR looking worse today like CHF.  Plan UF on HD today and bring back for extra HD tomorrow.  2. Fevers/ diarrhea - diarrhea continues, fevers better but still spiking some. Per primary.  3. HTN - continues on home BP meds  4. Vol - as above, looking like CHF 5. Anemia - Hb 11, no need esa 6. MBD cont meds 7. COPD 8. Thrombocytopenia - recurrent issue.  Will dc heparin with HD due to prolonged bleeding.    Plan - HD today and tomorrow, get volume down.    Kelly Splinter MD [facility] pager [phone]   11/07/2016, 11:30 AM    Recent Labs Lab 11/01/16 0412  11/05/16 0509 11/06/16 0500 11/07/16 0549  NA 138  < > 135 134* 132*  K 4.5  < > 4.2 4.5 5.0  CL 93*  < > 94* 94* 93*  CO2 30  < > 22 26 21*  GLUCOSE 124*  < > 102* 99 120*  BUN 36*  < > 80* 42* 59*  CREATININE 8.24*  < > 18.16* 13.19* 15.82*  CALCIUM  9.2  < > 7.7* 8.4* 7.9*  PHOS 4.3  --   --   --   --   < > = values in this interval not displayed.  Recent Labs Lab 11/02/16 0840 11/04/16 0420 11/07/16 0549  AST 27 26 33  ALT 19 22 37  ALKPHOS 88 79 72  BILITOT 0.9 1.1 1.3*  PROT 6.3* 6.9 7.1  ALBUMIN 3.5 3.3* 3.2*    Recent Labs Lab 11/04/16 0420 11/05/16 0509 11/06/16 0500  WBC 4.6 3.1* 3.3*  NEUTROABS 3.0 1.6* 2.0  HGB 11.7* 10.4* 11.6*  HCT 35.8* 32.9* 36.1*  MCV 95.7 95.1 94.3  PLT 65* 59* 65*   Iron/TIBC/Ferritin/ %Sat    Component Value Date/Time   IRON 90 01/19/2010 1016   TIBC 225 01/19/2010 1016   FERRITIN 318 01/19/2010 1016   IRONPCTSAT 40 01/19/2010 1016

## 2016-11-07 NOTE — Progress Notes (Signed)
Pt transported from Dialysis to [facility] via [facility]. Pt returned to room & RT notified that patient was back on unit.

## 2016-11-07 NOTE — Progress Notes (Signed)
Initial Nutrition Assessment  DOCUMENTATION CODES:   Not applicable  INTERVENTION:   -Once diet is advanced, resume:  -30 ml Prostat BID, each supplement provides 100 kcals and 15 grams protein -Continue renal MVI  NUTRITION DIAGNOSIS:   Inadequate oral intake related to nausea, vomiting, poor appetite, acute illness (Cdiff) as evidenced by per patient/family report, percent weight loss.  Ongoing  GOAL:   Patient will meet greater than or equal to 90% of their needs  Progressing  MONITOR:   PO intake, Supplement acceptance, Labs, Weight trends  REASON FOR ASSESSMENT:   Malnutrition Screening Tool    ASSESSMENT:   Kirk [name] is a 56 y.o. male with medical history significant ESRD on dialysis MWF, HTN, osa, copd, diastolic HF GERD chronic headache presents to the emergency department with chief complaint gradual worsening shortness of breath general fatigue fever increased cough. Initial evaluation reveals fever signs of volume overload.  10/17- transferred to SDU due to respiratory distress and concern for volume overload  Pt currently on Bi-pap. Pt in HD at time of visit and no family present to provide additional hx.   Noted meal completion is fairly good- PO: 50-100%. However, RN documentation reports poor oral intake yesterday, consuming less than 25% of meals that day. Pt has been taking supplements as prescribed.   Pt is NPO, due to bi-pap.   Labs reviewed: Na: 132.   Diet Order:  Diet heart healthy/carb modified Room service appropriate? Yes; Fluid consistency: Thin  Skin:  Reviewed, no issues  Last BM:  11/05/16  Height:   Ht Readings from Last 1 Encounters:  11/02/16 5\' 7"  (1.702 m)    Weight:   Wt Readings from Last 1 Encounters:  11/07/16 223 lb (101.2 kg)    Ideal Body Weight:  67.27 kg  BMI:  Body mass index is 34.93 kg/m.  Estimated Nutritional Needs:   Kcal:  2150-2350 kcals (30-33 kcal/kg sbw)  Protein:  92-105g pro  (1.3-1.5 g/kg bw)  Fluid:  1.2 Liters or Per MD  EDUCATION NEEDS:   No education needs identified at this time  Keundra Petrucelli A. Jimmye Norman, RD, LDN, CDE Pager: [phone] After hours Pager: [phone]

## 2016-11-07 NOTE — Progress Notes (Signed)
Desats to 80's when taking out bipap, md aware.

## 2016-11-07 NOTE — Progress Notes (Signed)
[facility] Progress Note    Since last GI note: Morning nurse was not told of any significant GI, bowel troubles overnight. Kirk [name] tells me this morning he was up several times with loose stools, non-bloody. He is currently in dialysis on BIPAP, has worsening respiratory issues overnight.  Objective: Vital signs in last 24 hours: Temp:  [97.5 F (36.4 C)-98.6 F (37 C)] 97.5 F (36.4 C) (10/17 0820) Pulse Rate:  [67-80] 74 (10/17 0855) Resp:  [17-30] 20 (10/17 0855) BP: (132-162)/(62-98) 143/77 (10/17 0855) SpO2:  [85 %-98 %] 98 % (10/17 0855) Weight:  [223 lb (101.2 kg)] 223 lb (101.2 kg) (10/17 0400) Last BM Date: 11/05/16 General: alert and oriented times 3; bipap in place Heart: regular rate and rythm Abdomen: soft, non-tender, non-distended, normal bowel sounds   Lab Results:  Recent Labs  11/05/16 0509 11/06/16 0500  WBC 3.1* 3.3*  HGB 10.4* 11.6*  PLT 59* 65*  MCV 95.1 94.3    Recent Labs  11/05/16 0509 11/06/16 0500 11/07/16 0549  NA 135 134* 132*  K 4.2 4.5 5.0  CL 94* 94* 93*  CO2 22 26 21*  GLUCOSE 102* 99 120*  BUN 80* 42* 59*  CREATININE 18.16* 13.19* 15.82*  CALCIUM 7.7* 8.4* 7.9*    Recent Labs  11/07/16 0549  PROT 7.1  ALBUMIN 3.2*  AST 33  ALT 37  ALKPHOS 72  BILITOT 1.3*    Studies/Results: Ct Abdomen Pelvis Wo Contrast  Result Date: 11/05/2016 CLINICAL DATA:  Abdomen distension EXAM: CT ABDOMEN AND PELVIS WITHOUT CONTRAST TECHNIQUE: Multidetector CT imaging of the abdomen and pelvis was performed following the standard protocol without IV contrast. COMPARISON:  11/04/2016, 07/02/2014, 02/17/2014, 09/08/2013 FINDINGS: Lower chest: Lung bases demonstrate no consolidation or effusion. Mild centrilobular density within the bilateral lower lobes and right middle lobe. Small ground-glass nodular density in the right lower lobe. Borderline cardiomegaly. Hepatobiliary: No focal liver abnormality is seen. Status post  cholecystectomy. No biliary dilatation. Pancreas: Unremarkable. No pancreatic ductal dilatation or surrounding inflammatory changes. Spleen: Normal in size without focal abnormality. Adrenals/Urinary Tract: Adrenal glands are within normal limits. Atrophic native kidneys containing multiple stones. Largest on the left is seen in the lower pole and measures 9 mm. Multiple stones in the right kidney measuring up to 8 mm. No hydronephrosis. 1 cm hypodensity lower pole left kidney, possibly a cyst but further evaluation limited without contrast. Bladder nearly empty. Soft tissue density with calcification and fat density anterior to the right psoas consistent with scarred transplanted kidney. Stomach/Bowel: The stomach is slightly enlarged. No dilated small bowel. Diffuse colon wall thickening with collapsed appearance of the colon. Normal appendix. Vascular/Lymphatic: Aortic atherosclerosis. No enlarged abdominal or pelvic lymph nodes. Reproductive: Prostate is slightly enlarged. Other: Negative for free air or free fluid. Musculoskeletal: No acute or significant osseous findings. IMPRESSION: 1. Negative for bowel obstruction or ascites 2. Diffuse colon wall thickening consistent with history of C difficile colitis. 3. Atrophic native kidneys and scarred appearing right pelvic transplanted kidney. 4. Small ground-glass nodular density in the right lower lobe with centrilobular densities in both lower lobes in the right middle lobe consistent with respiratory infection, to include atypical entities. 5. Atrophic native kidneys containing multiple stones. Negative for hydronephrosis. Electronically Signed   By: Donavan Foil M.D.   On: 11/05/2016 20:43   Ct Chest Wo Contrast  Result Date: 11/06/2016 CLINICAL DATA:  Coughing, short of breath EXAM: CT CHEST WITHOUT CONTRAST TECHNIQUE: Multidetector CT imaging of the chest was  performed following the standard protocol without IV contrast. COMPARISON:  10/14/8 FINDINGS:  Cardiovascular: No significant vascular findings. Normal heart size. No pericardial effusion. Mediastinum/Nodes: A segmental airspace density in RIGHT upper lobe along the bronchial tree suggest pneumonia. Someone involvement of the RIGHT middle lobe and RIGHT lower lobe additionally with nodular airspace opacities (image 80, series 5 and image 101, series 5. The LEFT lung is felt clear Lungs/Pleura: No axillary or supraclavicular adenopathy. No mediastinal adenopathy. No pericardial fluid. Upper Abdomen: Limited view of the liver, kidneys, pancreas are unremarkable. Normal adrenal glands. Musculoskeletal: No aggressive osseous lesion. IMPRESSION: 1. RIGHT upper lobe pneumonia with moderate involvement of the RIGHT middle lobe and RIGHT lower lobe consists with multilobar pneumonia. Recommend follow-up CT of the chest in 6 to 8 weeks following antibiotic therapy to demonstrate resolution underlying nodularity and exclude atypical infection. 2. No mediastinal lymphadenopathy. 3. Coronary artery calcification and Aortic Atherosclerosis (ICD10-I70.0). Electronically Signed   By: Suzy Bouchard M.D.   On: 11/06/2016 14:03   Dg Chest Port 1 View  Result Date: 11/07/2016 CLINICAL DATA:  Respiratory distress. EXAM: PORTABLE CHEST 1 VIEW COMPARISON:  11/04/2016 FINDINGS: Stable cardiomegaly with minimal aortic atherosclerosis. Multifocal airspace opacities persist in the right upper and both lower lobes, similar in appearance without progression. No new findings. No overt pulmonary edema, effusion or pneumothorax. ACDF of the included lower cervical spine. IMPRESSION: 1. Unchanged cardiomegaly with aortic atherosclerosis. 2. Patchy faint airspace opacities in the right upper and both lower lobes remain unchanged. Findings may represent multifocal pneumonia and/or atelectasis. Electronically Signed   By: Ashley Royalty M.D.   On: 11/07/2016 03:48     Medications: Scheduled Meds: . amLODipine  10 mg Oral Daily  .  aspirin EC  81 mg Oral Daily  . calcitRIOL  0.25 mcg Oral Q M,W,F-HD  . calcitRIOL  1.25 mcg Oral Once per day on Mon Wed Fri  . cholecalciferol  1,000 Units Oral Daily  . clonazePAM  0.5 mg Oral BID  . famotidine  20 mg Oral Q24H  . feeding supplement (PRO-STAT SUGAR FREE 64)  30 mL Oral BID  . guaiFENesin  600 mg Oral BID  . hydrALAZINE  50 mg Oral Q8H  . insulin aspart  0-9 Units Subcutaneous TID WC  . ipratropium-albuterol  3 mL Nebulization QID  . mouth rinse  15 mL Mouth Rinse BID  . metoprolol tartrate  75 mg Oral BID  . mometasone-formoterol  2 puff Inhalation BID  . multivitamin  1 tablet Oral QHS  . pravastatin  40 mg Oral QPM  . sevelamer carbonate  2,400 mg Oral TID WC  . vancomycin  500 mg Oral Q6H   Continuous Infusions: . azithromycin Stopped (11/07/16 0006)  . metronidazole Stopped (11/07/16 0547)  . piperacillin-tazobactam (ZOSYN)  IV Stopped (11/07/16 0419)   PRN Meds:.acetaminophen **OR** acetaminophen, diphenhydrAMINE, guaiFENesin-dextromethorphan, HYDROcodone-acetaminophen, ipratropium-albuterol, metoCLOPramide, ondansetron **OR** ondansetron (ZOFRAN) IV, traMADol    Assessment/Plan: 56 y.o. male with multiple medical problems (ESRD, CHF, COPD, likely pneumonia); c. Diff + associated diarrhea  Yesterday, vancomycin was increased to 500 QID orally and IV flagyl was added for C. Diff diarrhea.  He is also on zosyn and zithromax for probable healthcare associated pneumonia.  The multiple abx may make it difficult to treat his C. Diff, can zosyn and/or zithromax be stopped?  I'm also having a hard time knowing exactly how much diarrhea he is having. He changes his history periodically and it is usuall quite different from what staff  reports. Will ask that stool chart be kept (each stool should be documented by staff; estimate volume; state character loose vs solid vs watery; note any bleeding).    For now, respiratory status predominates still.  Milus Banister,  MD  11/07/2016, 9:23 AM [facility] Pager [phone]

## 2016-11-07 NOTE — Progress Notes (Signed)
Pt was transferred to [facility] for increase SOB, and low 02 sat. Pt has been on CPAP but he takes it off intermittently. His 02 sat dropped down to high 70's to low 80's. He was sweating and couldn't breath. Rapid response staff was on the unit at the time. Respiratory therapist was paged. Pt was finally put on non-re breather after the duo-neb tx. STAT CXR was done. MD was notified and orders received. Later MD came up to the unit to assess the pt. Wife and child were at bedside.

## 2016-11-07 NOTE — Progress Notes (Signed)
PROGRESS NOTE  Kirk [name] IRJ:[id number] DOB: 1960-11-24 DOA: 11/02/2016 PCP: Gayland Curry, DO  HPI/Recap of past 24 hours: Kirk [name] is a 56 year old male with past medical history significant for end-stage renal disease on hemodialysis Monday Wednesday Friday, diastolic CHF, hypertension, obstructive sleep apnea on CPAP, diabetes, who presented 7 days ago on 10/31/2016 with complaints of gradually worsening dyspnea, coughing, abdominal pain, and diarrhea.  Patient was admitted with acute hypoxic respiratory failure secondary to acute pulmonary edema and acute C. difficile enteritis.  Overnight, a rapid response was called due to acute respiratory distress requiring non invasive mechanical ventilation, BiPAP.  Chest x-ray was done which was reviewed by self and revealed right lower lobe infiltrates with increase pulmonary vascularity.  This morning patient receiving hemodialysis and continues to require BiPAP.  Last bowel movement was yesterday 3 loose stools.  Continues treatment for C. Difficile enteritis. Admits to mild nausea with no vomiting and persistent abdominal pain. In dialysis this morning, patient complains of chest pain, sharp in nature, radiating across his chest, 10/10, and intermittent. No improving or worsening factors. EKG done and troponin ordered.  OBJECTIVE:  Vitals blood pressure 162/81, heart rate 80, respiratory 30 on BiPAP, temperature 97.5.  Physical exam:  General: 56 year old male, lying in the dialysis bed on BiPAP, appears uncomfortable due to abdominal pain and chest pain. HEENT: No pallor no JVD present.  Head is normocephalic atraumatic.  Respiratory: Diffuse rhonchi bilaterally; no wheezes noted. Cardiovascular: Regular rate and rhythm with no murmurs noted rubs or gallops Abdomen: Hypoactive bowel sounds.  Diffuse tenderness on palpation. Musculoskeletal: No edema; left upper extremity AV fistula; onychomycosis noted in toenails. Neuro: alert and  oriented x3. No noted motor deficits. Skin: No lesions or rash. Psychiatric: Mood is appropriate for condition and circumstances.  Objective: Vitals:   11/07/16 1030 11/07/16 1100 11/07/16 1130 11/07/16 1200  BP: (!) 142/72 121/65 118/68 139/75  Pulse: 70 69 72 71  Resp: 18 19 18 18   Temp:      TempSrc:      SpO2: 98% 98% 98% 98%  Weight:      Height:        Intake/Output Summary (Last 24 hours) at 11/07/16 1209 Last data filed at 11/06/16 2306  Gross per 24 hour  Intake              100 ml  Output                0 ml  Net              100 ml   Filed Weights   11/05/16 1143 11/06/16 0402 11/07/16 0400  Weight: 97.1 kg (214 lb 1.1 oz) 96.9 kg (213 lb 11.2 oz) 101.2 kg (223 lb)     Data Reviewed: CBC:  Recent Labs Lab 11/02/16 0840 11/03/16 0437 11/04/16 0420 11/05/16 0509 11/06/16 0500  WBC 3.4* 3.1* 4.6 3.1* 3.3*  NEUTROABS 2.1  --  3.0 1.6* 2.0  HGB 11.8* 11.9* 11.7* 10.4* 11.6*  HCT 36.8* 38.2* 35.8* 32.9* 36.1*  MCV 94.8 96.7 95.7 95.1 94.3  PLT 73* 67* 65* 59* 65*   Basic Metabolic Panel:  Recent Labs Lab 11/01/16 0412  11/03/16 0437 11/04/16 0420 11/05/16 0509 11/06/16 0500 11/07/16 0549  NA 138  < > 135 136 135 134* 132*  K 4.5  < > 4.0 4.2 4.2 4.5 5.0  CL 93*  < > 95* 95* 94* 94* 93*  CO2 30  < >  24 25 22 26  21*  GLUCOSE 124*  < > 99 105* 102* 99 120*  BUN 36*  < > 40* 63* 80* 42* 59*  CREATININE 8.24*  < > 11.19* 15.35* 18.16* 13.19* 15.82*  CALCIUM 9.2  < > 8.0* 7.9* 7.7* 8.4* 7.9*  MG  --   --   --  2.1 2.5* 2.3 2.3  PHOS 4.3  --   --   --   --   --   --   < > = values in this interval not displayed. GFR: Estimated Creatinine Clearance: 6 mL/min (A) (by C-G formula based on SCr of 15.82 mg/dL (H)). Liver Function Tests:  Recent Labs Lab 11/02/16 0840 11/04/16 0420 11/07/16 0549  AST 27 26 33  ALT 19 22 37  ALKPHOS 88 79 72  BILITOT 0.9 1.1 1.3*  PROT 6.3* 6.9 7.1  ALBUMIN 3.5 3.3* 3.2*    Recent Labs Lab 11/02/16 0801    LIPASE 74*   No results for input(s): AMMONIA in the last 168 hours. Coagulation Profile:  Recent Labs Lab 11/02/16 0840  INR 1.06   Cardiac Enzymes: No results for input(s): CKTOTAL, CKMB, CKMBINDEX, TROPONINI in the last 168 hours. BNP (last 3 results) No results for input(s): PROBNP in the last 8760 hours. HbA1C: No results for input(s): HGBA1C in the last 72 hours. CBG:  Recent Labs Lab 11/05/16 2056 11/06/16 0737 11/06/16 1136 11/06/16 1634 11/06/16 2307  GLUCAP 146* 93 110* 115* 118*   Lipid Profile: No results for input(s): CHOL, HDL, LDLCALC, TRIG, CHOLHDL, LDLDIRECT in the last 72 hours. Thyroid Function Tests: No results for input(s): TSH, T4TOTAL, FREET4, T3FREE, THYROIDAB in the last 72 hours. Anemia Panel: No results for input(s): VITAMINB12, FOLATE, FERRITIN, TIBC, IRON, RETICCTPCT in the last 72 hours. Urine analysis:    Component Value Date/Time   COLORURINE YELLOW 11/06/2016 0814   APPEARANCEUR CLOUDY (A) 11/06/2016 0814   LABSPEC 1.015 11/06/2016 0814   PHURINE 6.0 11/06/2016 0814   GLUCOSEU NEGATIVE 11/06/2016 0814   HGBUR SMALL (A) 11/06/2016 0814   BILIRUBINUR NEGATIVE 11/06/2016 0814   KETONESUR NEGATIVE 11/06/2016 0814   PROTEINUR >=300 (A) 11/06/2016 0814   UROBILINOGEN 0.2 04/06/2014 1030   NITRITE NEGATIVE 11/06/2016 0814   LEUKOCYTESUR SMALL (A) 11/06/2016 0814   Sepsis Labs: @LABRCNTIP (procalcitonin:4,lacticidven:4)  ) Recent Results (from the past 240 hour(s))  MRSA PCR Screening     Status: None   Collection Time: 10/31/16  1:46 PM  Result Value Ref Range Status   MRSA by PCR NEGATIVE NEGATIVE Final    Comment:        The GeneXpert MRSA Assay (FDA approved for NASAL specimens only), is one component of a comprehensive MRSA colonization surveillance program. It is not intended to diagnose MRSA infection nor to guide or monitor treatment for MRSA infections.   Culture, blood (Routine x 2)     Status: None (Preliminary  result)   Collection Time: 11/02/16  6:20 AM  Result Value Ref Range Status   Specimen Description BLOOD RIGHT ARM  Final   Special Requests   Final    BOTTLES DRAWN AEROBIC AND ANAEROBIC Blood Culture adequate volume   Culture NO GROWTH 4 DAYS  Final   Report Status PENDING  Incomplete  Culture, blood (Routine x 2)     Status: None (Preliminary result)   Collection Time: 11/02/16  6:33 AM  Result Value Ref Range Status   Specimen Description BLOOD RIGHT HAND  Final   Special  Requests IN PEDIATRIC BOTTLE Blood Culture adequate volume  Final   Culture NO GROWTH 4 DAYS  Final   Report Status PENDING  Incomplete  Culture, blood (routine x 2)     Status: None (Preliminary result)   Collection Time: 11/02/16  5:00 PM  Result Value Ref Range Status   Specimen Description BLOOD HEMODIALYSIS FISTULA  Final   Special Requests   Final    BOTTLES DRAWN AEROBIC AND ANAEROBIC Blood Culture adequate volume   Culture NO GROWTH 4 DAYS  Final   Report Status PENDING  Incomplete  MRSA PCR Screening     Status: None   Collection Time: 11/02/16  9:40 PM  Result Value Ref Range Status   MRSA by PCR NEGATIVE NEGATIVE Final    Comment:        The GeneXpert MRSA Assay (FDA approved for NASAL specimens only), is one component of a comprehensive MRSA colonization surveillance program. It is not intended to diagnose MRSA infection nor to guide or monitor treatment for MRSA infections.   Urine culture     Status: None   Collection Time: 11/06/16  8:14 AM  Result Value Ref Range Status   Specimen Description URINE, CLEAN CATCH  Final   Special Requests NONE  Final   Culture NO GROWTH  Final   Report Status 11/07/2016 FINAL  Final      Studies: Ct Chest Wo Contrast  Result Date: 11/06/2016 CLINICAL DATA:  Coughing, short of breath EXAM: CT CHEST WITHOUT CONTRAST TECHNIQUE: Multidetector CT imaging of the chest was performed following the standard protocol without IV contrast. COMPARISON:   10/14/8 FINDINGS: Cardiovascular: No significant vascular findings. Normal heart size. No pericardial effusion. Mediastinum/Nodes: A segmental airspace density in RIGHT upper lobe along the bronchial tree suggest pneumonia. Someone involvement of the RIGHT middle lobe and RIGHT lower lobe additionally with nodular airspace opacities (image 80, series 5 and image 101, series 5. The LEFT lung is felt clear Lungs/Pleura: No axillary or supraclavicular adenopathy. No mediastinal adenopathy. No pericardial fluid. Upper Abdomen: Limited view of the liver, kidneys, pancreas are unremarkable. Normal adrenal glands. Musculoskeletal: No aggressive osseous lesion. IMPRESSION: 1. RIGHT upper lobe pneumonia with moderate involvement of the RIGHT middle lobe and RIGHT lower lobe consists with multilobar pneumonia. Recommend follow-up CT of the chest in 6 to 8 weeks following antibiotic therapy to demonstrate resolution underlying nodularity and exclude atypical infection. 2. No mediastinal lymphadenopathy. 3. Coronary artery calcification and Aortic Atherosclerosis (ICD10-I70.0). Electronically Signed   By: Suzy Bouchard M.D.   On: 11/06/2016 14:03   Dg Chest Port 1 View  Result Date: 11/07/2016 CLINICAL DATA:  Respiratory distress. EXAM: PORTABLE CHEST 1 VIEW COMPARISON:  11/04/2016 FINDINGS: Stable cardiomegaly with minimal aortic atherosclerosis. Multifocal airspace opacities persist in the right upper and both lower lobes, similar in appearance without progression. No new findings. No overt pulmonary edema, effusion or pneumothorax. ACDF of the included lower cervical spine. IMPRESSION: 1. Unchanged cardiomegaly with aortic atherosclerosis. 2. Patchy faint airspace opacities in the right upper and both lower lobes remain unchanged. Findings may represent multifocal pneumonia and/or atelectasis. Electronically Signed   By: Ashley Royalty M.D.   On: 11/07/2016 03:48    Scheduled Meds: . amLODipine  10 mg Oral Daily  .  aspirin EC  81 mg Oral Daily  . calcitRIOL  0.25 mcg Oral Q M,W,F-HD  . calcitRIOL  1.25 mcg Oral Once per day on Mon Wed Fri  . cholecalciferol  1,000 Units Oral Daily  .  clonazePAM  0.5 mg Oral BID  . famotidine  20 mg Oral Q24H  . feeding supplement (PRO-STAT SUGAR FREE 64)  30 mL Oral BID  . guaiFENesin  600 mg Oral BID  . hydrALAZINE  50 mg Oral Q8H  . insulin aspart  0-9 Units Subcutaneous TID WC  . ipratropium-albuterol  3 mL Nebulization QID  . mouth rinse  15 mL Mouth Rinse BID  . metoprolol tartrate  75 mg Oral BID  . mometasone-formoterol  2 puff Inhalation BID  . multivitamin  1 tablet Oral QHS  . pravastatin  40 mg Oral QPM  . sevelamer carbonate  2,400 mg Oral TID WC  . vancomycin  500 mg Oral Q6H    Continuous Infusions: . sodium chloride    . sodium chloride    . sodium chloride    . sodium chloride    . azithromycin Stopped (11/07/16 0006)  . metronidazole Stopped (11/07/16 0547)  . piperacillin-tazobactam (ZOSYN)  IV Stopped (11/07/16 0419)     LOS: 5 days    Assessment/Plan: Principal Problem:   Dyspnea Active Problems:  Acute hypoxic respiratory failure requiring noninvasive mechanical ventilation most likely multifactorial secondary to multilobular HCAP, present on admission, versus acute pulmonary edema -Hemodialysis today to help with fluid overload/acute pulmonary edema. -Continue BiPAP as needed; repeat ABG later this afternoon and continue to monitor O2 saturation -Sputum culture pending  -Continue IV zosyn and IV azithromycin, breathing treatment duonebs and dulera  Acute multilobular HCAP, present on admission - Management as stated above  Acute Clostridium difficile enteritis, present on admission -Continue treatment with oral vancomycin day #7 and IV Flagyl day#2 -Continue to monitor I's and O with stool frequency  OSA on CPAP at home -Continue BIPAP as needed -When no longer requiring BIPAP, resume CPAP  Chest pain -Ruled out  ACS -EKG NS with no ST T changes; and troponin less than 0.03.  Acute hyponatremia -Na+ 132;  -Most likely hemodilutional  -Continue close monitoring and repeat chemistry panel in the am.  End-stage renal disease on dialysis Monday Wednesday Friday -Nephrology following  Pancytopenia -WBC 3.3, hemoglobin 11.6 platelets, 65 -Continue to monitor closely repeat CBC in the morning    HTN (hypertension) -Uncontrolled -Will adjust hypertensive medications    DM (diabetes mellitus), type 2 with renal complications (HCC) -Stable    Anemia in chronic renal disease -Stable  Diastolic CHF - Last echo EF 55-60% 09/19/16    HLD (hyperlipidemia) -Stable -continue pravastatin when off BIPAP    Obesity hypoventilation syndrome (HCC) -Continue BiPAP as needed -Continue to monitor PCO2 levels    Fatigue -Most likely multifactorial secondary to acute Clostridium difficile enteritis, acute hypoxic respiratory failure, chronic anemia, end-stage renal disease   Code Status: Full code  Family Communication: None  Disposition Plan: Will stay another midnight to continue treatment for acute hypoxic respiratory failure and c-diff enteritis which was present on admission.   Consultants: Nephrology and GI  Procedures: None  Antimicrobials: Oral vancomycin and IV Flagyl  DVT prophylaxis: SCDs; Not on chemical DVT ppx due to thrombocytopenia.  Kayleen Memos, MD [facility] Pager [phone]-  If 7PM-7AM, please contact night-coverage [email or web address] Password [email or web address] 11/07/2016, 12:09 PM

## 2016-11-07 NOTE — Progress Notes (Signed)
Back from dialysis by bed awake and alert. 

## 2016-11-08 ENCOUNTER — Inpatient Hospital Stay (HOSPITAL_COMMUNITY): Payer: Medicare Other

## 2016-11-08 DIAGNOSIS — E785 Hyperlipidemia, unspecified: Secondary | ICD-10-CM

## 2016-11-08 DIAGNOSIS — R601 Generalized edema: Secondary | ICD-10-CM

## 2016-11-08 DIAGNOSIS — Z6835 Body mass index (BMI) 35.0-35.9, adult: Secondary | ICD-10-CM

## 2016-11-08 LAB — CBC WITH DIFFERENTIAL/PLATELET
BASOS ABS: 0 10*3/uL (ref 0.0–0.1)
BASOS PCT: 1 %
Eosinophils Absolute: 0.1 10*3/uL (ref 0.0–0.7)
Eosinophils Relative: 1 %
HCT: 33.9 % — ABNORMAL LOW (ref 39.0–52.0)
HEMOGLOBIN: 11.2 g/dL — AB (ref 13.0–17.0)
Lymphocytes Relative: 15 %
Lymphs Abs: 0.6 10*3/uL — ABNORMAL LOW (ref 0.7–4.0)
MCH: 30.6 pg (ref 26.0–34.0)
MCHC: 33 g/dL (ref 30.0–36.0)
MCV: 92.6 fL (ref 78.0–100.0)
MONOS PCT: 10 %
Monocytes Absolute: 0.4 10*3/uL (ref 0.1–1.0)
NEUTROS ABS: 2.8 10*3/uL (ref 1.7–7.7)
NEUTROS PCT: 73 %
Platelets: 74 10*3/uL — ABNORMAL LOW (ref 150–400)
RBC: 3.66 MIL/uL — ABNORMAL LOW (ref 4.22–5.81)
RDW: 14.2 % (ref 11.5–15.5)
WBC: 3.9 10*3/uL — ABNORMAL LOW (ref 4.0–10.5)

## 2016-11-08 LAB — COMPREHENSIVE METABOLIC PANEL
ALBUMIN: 3.3 g/dL — AB (ref 3.5–5.0)
ALT: 34 U/L (ref 17–63)
ANION GAP: 14 (ref 5–15)
AST: 26 U/L (ref 15–41)
Alkaline Phosphatase: 77 U/L (ref 38–126)
BUN: 30 mg/dL — AB (ref 6–20)
CO2: 24 mmol/L (ref 22–32)
Calcium: 8.1 mg/dL — ABNORMAL LOW (ref 8.9–10.3)
Chloride: 96 mmol/L — ABNORMAL LOW (ref 101–111)
Creatinine, Ser: 12.37 mg/dL — ABNORMAL HIGH (ref 0.61–1.24)
GFR calc Af Amer: 5 mL/min — ABNORMAL LOW (ref >60)
GFR calc non Af Amer: 4 mL/min — ABNORMAL LOW (ref >60)
GLUCOSE: 111 mg/dL — AB (ref 65–99)
POTASSIUM: 3.5 mmol/L (ref 3.5–5.1)
Sodium: 134 mmol/L — ABNORMAL LOW (ref 135–145)
Total Bilirubin: 1.1 mg/dL (ref 0.3–1.2)
Total Protein: 7 g/dL (ref 6.5–8.1)

## 2016-11-08 LAB — GLUCOSE, CAPILLARY
GLUCOSE-CAPILLARY: 160 mg/dL — AB (ref 65–99)
Glucose-Capillary: 95 mg/dL (ref 65–99)
Glucose-Capillary: 122 mg/dL — ABNORMAL HIGH (ref 65–99)
Glucose-Capillary: 96 mg/dL (ref 65–99)

## 2016-11-08 MED ORDER — AMLODIPINE BESYLATE 5 MG PO TABS
5.0000 mg | ORAL_TABLET | Freq: Every day | ORAL | Status: DC
  Administered 2016-11-09: 5 mg via ORAL
  Filled 2016-11-08: qty 1

## 2016-11-08 MED ORDER — METOPROLOL TARTRATE 50 MG PO TABS
50.0000 mg | ORAL_TABLET | Freq: Two times a day (BID) | ORAL | Status: DC
  Administered 2016-11-08: 50 mg via ORAL
  Filled 2016-11-08: qty 1

## 2016-11-08 NOTE — Progress Notes (Signed)
[facility] Progress Note    Since last GI note: No diarrhea in 36 hours per wife.  He is struggling to breath on BIPAP currently.  Objective: Vital signs in last 24 hours: Temp:  [97.5 F (36.4 C)-99.1 F (37.3 C)] 97.9 F (36.6 C) (10/18 0721) Pulse Rate:  [66-96] 73 (10/18 0818) Resp:  [18-27] 21 (10/18 0818) BP: (109-160)/(49-91) 153/71 (10/18 0818) SpO2:  [95 %-100 %] 100 % (10/18 0818) FiO2 (%):  [50 %-60 %] 60 % (10/18 0817) Weight:  [213 lb 14.4 oz (97 kg)-215 lb 9.8 oz (97.8 kg)] 213 lb 14.4 oz (97 kg) (10/18 0604) Last BM Date: 11/05/16 General: alert and oriented times 3 Heart: regular rate and rythm Abdomen: soft, non-tender, non-distended, normal bowel sounds   Lab Results:  Recent Labs  11/06/16 0500 11/07/16 1410 11/08/16 0200  WBC 3.3* 5.6 3.9*  HGB 11.6* Kirk.0* 11.2*  PLT 65* 74* 74*  MCV 94.3 92.7 92.6    Recent Labs  11/06/16 0500 11/07/16 0549 11/08/16 0200  NA 134* 132* 134*  K 4.5 5.0 3.5  CL 94* 93* 96*  CO2 26 21* 24  GLUCOSE 99 120* 111*  BUN 42* 59* 30*  CREATININE 13.19* 15.82* Kirk.37*  CALCIUM 8.4* 7.9* 8.1*    Recent Labs  11/07/16 0549 11/08/16 0200  PROT 7.1 7.0  ALBUMIN 3.2* 3.3*  AST 33 26  ALT 37 34  ALKPHOS 72 77  BILITOT 1.3* 1.1   No results for input(s): INR in the last 72 hours.   Studies/Results: Ct Chest Wo Contrast  Result Date: 11/06/2016 CLINICAL DATA:  Coughing, short of breath EXAM: CT CHEST WITHOUT CONTRAST TECHNIQUE: Multidetector CT imaging of the chest was performed following the standard protocol without IV contrast. COMPARISON:  10/14/8 FINDINGS: Cardiovascular: No significant vascular findings. Normal heart size. No pericardial effusion. Mediastinum/Nodes: A segmental airspace density in RIGHT upper lobe along the bronchial tree suggest pneumonia. Someone involvement of the RIGHT middle lobe and RIGHT lower lobe additionally with nodular airspace opacities (image 80, series 5 and  image 101, series 5. The LEFT lung is felt clear Lungs/Pleura: No axillary or supraclavicular adenopathy. No mediastinal adenopathy. No pericardial fluid. Upper Abdomen: Limited view of the liver, kidneys, pancreas are unremarkable. Normal adrenal glands. Musculoskeletal: No aggressive osseous lesion. IMPRESSION: 1. RIGHT upper lobe pneumonia with moderate involvement of the RIGHT middle lobe and RIGHT lower lobe consists with multilobar pneumonia. Recommend follow-up CT of the chest in 6 to 8 weeks following antibiotic therapy to demonstrate resolution underlying nodularity and exclude atypical infection. 2. No mediastinal lymphadenopathy. 3. Coronary artery calcification and Aortic Atherosclerosis (ICD10-I70.0). Electronically Signed   By: Suzy Bouchard M.D.   On: 11/06/2016 14:03   Dg Chest Port 1 View  Result Date: 11/07/2016 CLINICAL DATA:  Respiratory distress. EXAM: PORTABLE CHEST 1 VIEW COMPARISON:  11/04/2016 FINDINGS: Stable cardiomegaly with minimal aortic atherosclerosis. Multifocal airspace opacities persist in the right upper and both lower lobes, similar in appearance without progression. No new findings. No overt pulmonary edema, effusion or pneumothorax. ACDF of the included lower cervical spine. IMPRESSION: 1. Unchanged cardiomegaly with aortic atherosclerosis. 2. Patchy faint airspace opacities in the right upper and both lower lobes remain unchanged. Findings may represent multifocal pneumonia and/or atelectasis. Electronically Signed   By: Ashley Royalty M.D.   On: 11/07/2016 03:48     Medications: Scheduled Meds: . amLODipine  10 mg Oral Daily  . aspirin EC  81 mg Oral Daily  . calcitRIOL  0.25 mcg Oral Q M,W,F-HD  . calcitRIOL  1.25 mcg Oral Once per day on Mon Wed Fri  . cholecalciferol  1,000 Units Oral Daily  . clonazePAM  0.5 mg Oral BID  . famotidine  20 mg Oral Q24H  . feeding supplement (PRO-STAT SUGAR FREE 64)  30 mL Oral BID  . guaiFENesin  600 mg Oral BID  .  hydrALAZINE  50 mg Oral Q8H  . insulin aspart  0-9 Units Subcutaneous TID WC  . ipratropium-albuterol  3 mL Nebulization QID  . mouth rinse  15 mL Mouth Rinse BID  . metoprolol tartrate  75 mg Oral BID  . mometasone-formoterol  2 puff Inhalation BID  . multivitamin  1 tablet Oral QHS  . pravastatin  40 mg Oral QPM  . sevelamer carbonate  2,400 mg Oral TID WC  . vancomycin  500 mg Oral Q6H   Continuous Infusions: . azithromycin Stopped (11/07/16 2228)  . metronidazole Stopped (11/08/16 0502)  . piperacillin-tazobactam (ZOSYN)  IV Stopped (11/08/16 0803)   PRN Meds:.acetaminophen **OR** acetaminophen, diphenhydrAMINE, guaiFENesin-dextromethorphan, HYDROcodone-acetaminophen, ipratropium-albuterol, metoCLOPramide, ondansetron **OR** ondansetron (ZOFRAN) IV, traMADol    Assessment/Plan: 56 y.o. [name] with multiple medical problems (ESRD, CHF, COPD, likely pneumonia); c. Diff + associated diarrhea  He having significant breathing trouble, heading to HD again today. Wife in the room says his last BM was 36 hours ago.  Please continue current Rx for c. Diff diarrhea: vanc 500 qid and IV flagyl for another 5 days. Then OK to decrease to vancomycin 125mg  orally QID for another 10 day course.  I am covering hosp until this Sunday PM, call with any questions or concerns otherwise please follow the plan above for his C. Diff. Thanks     Milus Banister, MD  11/08/2016, 8:50 AM [facility] Pager [phone]

## 2016-11-08 NOTE — Progress Notes (Signed)
PROGRESS NOTE  Kirk [name] PJA:[id number] DOB: [birth year] DOA: 11/02/2016 PCP: Kirk Curry, DO  HPI/Recap of past 24 hours: Kirk [name] is a 56 year old male with past medical history significant for end-stage renal disease on hemodialysis Monday Wednesday Friday, diastolic CHF EF 34-19% (TTE 09/19/16), hypertension, obstructive sleep apnea on CPAP at home, diabetes, who presented 8 days ago on 10/31/2016 with complaints of gradually worsening dyspnea, coughing, abdominal pain, and diarrhea.  Patient was admitted with acute hypoxic respiratory failure secondary to acute pulmonary edema, fluid overload, and acute C. difficile enteritis.  Overnight, there were no acute events. Pt wore BIPAP overnight. Patient was seen and examined this morning at the dialysis center. Denies any diarrhea, abd pain, or diarrhea. During interview, pt was not wearing his BIPAP and satting 88-90%. Provider requested [place] to be put on to maintain O2 saturation 90, 92% or greater. Admits to generalized weakness and feeling hungry.  Physical exam:  General: 56 year old male, lying in the dialysis bed on RA, in no apparent acute distress. HEENT: No pallor no JVD present.  Head is normocephalic, atraumatic.  Respiratory: Mild rhonchi bilaterally, improved from yesterday. Cardiovascular: Regular rate and rhythm with no murmurs, rubs, or gallops Abdomen: Hypoactive bowel sounds. No tenderness on palpation. Musculoskeletal: No edema; left upper extremity AV fistula; onychomycosis noted in toenails. Neuro: alert and oriented x3. No noted motor deficits. Skin: No lesions or rash noted. Psychiatric: Mood is appropriate for condition and circumstances.   Objective: Vitals:   11/08/16 0922 11/08/16 0930 11/08/16 1000 11/08/16 1030  BP: 127/76 124/73 123/72 (!) 135/58  Pulse: 82 82 80 79  Resp: (!) 22 20 18 18   Temp: 97.8 F (36.6 C)     TempSrc: Oral     SpO2: 100% 100% 100% 100%  Weight: 97.8 kg (215 lb 9.8 oz)      Height:        Intake/Output Summary (Last 24 hours) at 11/08/16 1245 Last data filed at 11/08/16 0500  Gross per 24 hour  Intake              500 ml  Output                0 ml  Net              500 ml   Filed Weights   11/07/16 1210 11/08/16 0604 11/08/16 0922  Weight: 97.8 kg (215 lb 9.8 oz) 97 kg (213 lb 14.4 oz) 97.8 kg (215 lb 9.8 oz)     Data Reviewed: CBC:  Recent Labs Lab 11/04/16 0420 11/05/16 0509 11/06/16 0500 11/07/16 1410 11/08/16 0200  WBC 4.6 3.1* 3.3* 5.6 3.9*  NEUTROABS 3.0 1.6* 2.0 4.5 2.8  HGB 11.7* 10.4* 11.6* 12.0* 11.2*  HCT 35.8* 32.9* 36.1* 35.7* 33.9*  MCV 95.7 95.1 94.3 92.7 92.6  PLT 65* 59* 65* 74* 74*   Basic Metabolic Panel:  Recent Labs Lab 11/04/16 0420 11/05/16 0509 11/06/16 0500 11/07/16 0549 11/08/16 0200  NA 136 135 134* 132* 134*  K 4.2 4.2 4.5 5.0 3.5  CL 95* 94* 94* 93* 96*  CO2 25 22 26  21* 24  GLUCOSE 105* 102* 99 120* 111*  BUN 63* 80* 42* 59* 30*  CREATININE 15.35* 18.16* 13.19* 15.82* 12.37*  CALCIUM 7.9* 7.7* 8.4* 7.9* 8.1*  MG 2.1 2.5* 2.3 2.3  --    GFR: Estimated Creatinine Clearance: 7.6 mL/min (A) (by C-G formula based on SCr of 12.37 mg/dL (H)). Liver Function Tests:  Recent Labs Lab 11/02/16 0840 11/04/16 0420 11/07/16 0549 11/08/16 0200  AST 27 26 33 26  ALT 19 22 37 34  ALKPHOS 88 79 72 77  BILITOT 0.9 1.1 1.3* 1.1  PROT 6.3* 6.9 7.1 7.0  ALBUMIN 3.5 3.3* 3.2* 3.3*    Recent Labs Lab 11/02/16 0801  LIPASE 74*   No results for input(s): AMMONIA in the last 168 hours. Coagulation Profile:  Recent Labs Lab 11/02/16 0840  INR 1.06   Cardiac Enzymes:  Recent Labs Lab 11/07/16 1126  TROPONINI <0.03   BNP (last 3 results) No results for input(s): PROBNP in the last 8760 hours. HbA1C: No results for input(s): HGBA1C in the last 72 hours. CBG:  Recent Labs Lab 11/06/16 1634 11/06/16 2307 11/07/16 1750 11/07/16 2110 11/08/16 0759  GLUCAP 115* 118* 123* 100* 95    Lipid Profile: No results for input(s): CHOL, HDL, LDLCALC, TRIG, CHOLHDL, LDLDIRECT in the last 72 hours. Thyroid Function Tests: No results for input(s): TSH, T4TOTAL, FREET4, T3FREE, THYROIDAB in the last 72 hours. Anemia Panel: No results for input(s): VITAMINB12, FOLATE, FERRITIN, TIBC, IRON, RETICCTPCT in the last 72 hours. Urine analysis:    Component Value Date/Time   COLORURINE YELLOW 11/06/2016 0814   APPEARANCEUR CLOUDY (A) 11/06/2016 0814   LABSPEC 1.015 11/06/2016 0814   PHURINE 6.0 11/06/2016 0814   GLUCOSEU NEGATIVE 11/06/2016 0814   HGBUR SMALL (A) 11/06/2016 0814   BILIRUBINUR NEGATIVE 11/06/2016 0814   KETONESUR NEGATIVE 11/06/2016 0814   PROTEINUR >=300 (A) 11/06/2016 0814   UROBILINOGEN 0.2 04/06/2014 1030   NITRITE NEGATIVE 11/06/2016 0814   LEUKOCYTESUR SMALL (A) 11/06/2016 0814   Sepsis Labs: @LABRCNTIP (procalcitonin:4,lacticidven:4)  ) Recent Results (from the past 240 hour(s))  MRSA PCR Screening     Status: None   Collection Time: 10/31/16  1:46 PM  Result Value Ref Range Status   MRSA by PCR NEGATIVE NEGATIVE Final    Comment:        The GeneXpert MRSA Assay (FDA approved for NASAL specimens only), is one component of a comprehensive MRSA colonization surveillance program. It is not intended to diagnose MRSA infection nor to guide or monitor treatment for MRSA infections.   Culture, blood (Routine x 2)     Status: None   Collection Time: 11/02/16  6:20 AM  Result Value Ref Range Status   Specimen Description BLOOD RIGHT ARM  Final   Special Requests   Final    BOTTLES DRAWN AEROBIC AND ANAEROBIC Blood Culture adequate volume   Culture NO GROWTH 5 DAYS  Final   Report Status 11/07/2016 FINAL  Final  Culture, blood (Routine x 2)     Status: None   Collection Time: 11/02/16  6:33 AM  Result Value Ref Range Status   Specimen Description BLOOD RIGHT HAND  Final   Special Requests IN PEDIATRIC BOTTLE Blood Culture adequate volume  Final    Culture NO GROWTH 5 DAYS  Final   Report Status 11/07/2016 FINAL  Final  Culture, blood (routine x 2)     Status: None   Collection Time: 11/02/16  5:00 PM  Result Value Ref Range Status   Specimen Description BLOOD HEMODIALYSIS FISTULA  Final   Special Requests   Final    BOTTLES DRAWN AEROBIC AND ANAEROBIC Blood Culture adequate volume   Culture NO GROWTH 5 DAYS  Final   Report Status 11/07/2016 FINAL  Final  MRSA PCR Screening     Status: None   Collection Time: 11/02/16  9:40 PM  Result Value Ref Range Status   MRSA by PCR NEGATIVE NEGATIVE Final    Comment:        The GeneXpert MRSA Assay (FDA approved for NASAL specimens only), is one component of a comprehensive MRSA colonization surveillance program. It is not intended to diagnose MRSA infection nor to guide or monitor treatment for MRSA infections.   Urine culture     Status: None   Collection Time: 11/06/16  8:14 AM  Result Value Ref Range Status   Specimen Description URINE, CLEAN CATCH  Final   Special Requests NONE  Final   Culture NO GROWTH  Final   Report Status 11/07/2016 FINAL  Final  Respiratory Panel by PCR     Status: None   Collection Time: 11/07/16  3:31 PM  Result Value Ref Range Status   Adenovirus NOT DETECTED NOT DETECTED Final   Coronavirus 229E NOT DETECTED NOT DETECTED Final   Coronavirus HKU1 NOT DETECTED NOT DETECTED Final   Coronavirus NL63 NOT DETECTED NOT DETECTED Final   Coronavirus OC43 NOT DETECTED NOT DETECTED Final   Metapneumovirus NOT DETECTED NOT DETECTED Final   Rhinovirus / Enterovirus NOT DETECTED NOT DETECTED Final   Influenza A NOT DETECTED NOT DETECTED Final   Influenza B NOT DETECTED NOT DETECTED Final   Parainfluenza Virus 1 NOT DETECTED NOT DETECTED Final   Parainfluenza Virus 2 NOT DETECTED NOT DETECTED Final   Parainfluenza Virus 3 NOT DETECTED NOT DETECTED Final   Parainfluenza Virus 4 NOT DETECTED NOT DETECTED Final   Respiratory Syncytial Virus NOT DETECTED NOT  DETECTED Final   Bordetella pertussis NOT DETECTED NOT DETECTED Final   Chlamydophila pneumoniae NOT DETECTED NOT DETECTED Final   Mycoplasma pneumoniae NOT DETECTED NOT DETECTED Final      Studies: Dg Chest Port 1 View  Result Date: 11/08/2016 CLINICAL DATA:  Dyspnea in a renal failure patient. EXAM: PORTABLE CHEST 1 VIEW COMPARISON:  CT chest 11/06/2016. Single-view of the chest 02/08/2016. PA and lateral chest 11/04/2016. FINDINGS: There is cardiomegaly. Patchy bilateral airspace disease does not appear changed compared to yesterday's examination but has improved since 11/04/2016. No pneumothorax or pleural effusion. No acute bony abnormality. IMPRESSION: Patchy bilateral airspace disease most consistent with pneumonia appears unchanged since yesterday's study but is improved since 11/04/2016. Cardiomegaly. Electronically Signed   By: Inge Rise M.D.   On: 11/08/2016 09:23    Scheduled Meds: . [START ON 11/09/2016] amLODipine  5 mg Oral Daily  . aspirin EC  81 mg Oral Daily  . calcitRIOL  0.25 mcg Oral Q M,W,F-HD  . calcitRIOL  1.25 mcg Oral Once per day on Mon Wed Fri  . cholecalciferol  1,000 Units Oral Daily  . clonazePAM  0.5 mg Oral BID  . famotidine  20 mg Oral Q24H  . feeding supplement (PRO-STAT SUGAR FREE 64)  30 mL Oral BID  . guaiFENesin  600 mg Oral BID  . hydrALAZINE  50 mg Oral Q8H  . insulin aspart  0-9 Units Subcutaneous TID WC  . ipratropium-albuterol  3 mL Nebulization QID  . mouth rinse  15 mL Mouth Rinse BID  . metoprolol tartrate  50 mg Oral BID  . mometasone-formoterol  2 puff Inhalation BID  . multivitamin  1 tablet Oral QHS  . pravastatin  40 mg Oral QPM  . sevelamer carbonate  2,400 mg Oral TID WC  . vancomycin  500 mg Oral Q6H    Continuous Infusions: . azithromycin Stopped (11/07/16 2228)  .  metronidazole Stopped (11/08/16 0502)  . piperacillin-tazobactam (ZOSYN)  IV Stopped (11/08/16 0803)     LOS: 6 days    Assessment/Plan: Principal  Problem:   Dyspnea Active Problems:  Acute hypoxic respiratory failure requiring noninvasive mechanical ventilation most likely multifactorial secondary to multilobular HCAP, present on admission versus acute pulmonary edema from fluid overload -Hemodialysis today to help with fluid overload/acute pulmonary edema. Nephrology plans to remove 3.5L fluid. -BiPAP at night and as needed;  -Continue to monitor O2 saturation -Continue duonebs, dulera, and guafeinesen -Continue IV zosyn and IV azithromycin  Multilobular HCAP, present on admission -Improving; -Blood cx no growth in 5 days -Management as stated above  Acute Clostridium difficile enteritis, present on admission -Improving -Continue treatment with oral vancomycin day #8 and IV Flagyl day#3 -Continue to monitor I's and O with stool frequency  OSA on CPAP at home -Continue BIPAP at night and as needed -When no longer requiring BIPAP, resume CPAP  Chest pain -Resolved -EKG done yesterday, 11/07/16 reviewed by self showed NS with no ST T changes; troponin drawn yesterday less than 0.03.  Acute hyponatremia -Improving Na+134 from Na+ 132;  -Repeat BMP in the am.  End-stage renal disease on dialysis Monday Wednesday Friday -Nephrology following and planning to remove 3.5L of fluid today at dialysis.  Pancytopenia -Stable -Continue to monitor closely repeat CBC in the morning    HTN (hypertension) -Stable -Improving after hemodialysis    DM (diabetes mellitus), type 2 with renal complications (HCC) -Stable    Anemia in chronic renal disease -Stable  Diastolic CHF - Last echo EF 55-60% 09/19/16    HLD (hyperlipidemia) -Stable -Resume pravastatin    Obesity hypoventilation syndrome (HCC) -BiPAP at night and as needed -Continue to monitor PCO2 levels   Fatigue/generalized weakness -Most likely multifactorial secondary to acute Clostridium difficile infection, acute hypoxic respiratory failure,  chronic anemia, end-stage renal disease on hemodialysis. -Physical therapist to evaluate and treat.  Diet: Resume renal diet   Code Status: Full code  Family Communication: None  Disposition Plan: Will stay another midnight to continue treatment for acute hypoxic respiratory failure most likely 2/2 to multifocal HCAP, and to continue treatment for c-diff enteritis, which were all present on admission.   Consultants: Nephrology and GI  Procedures: None  Antimicrobials: Oral vancomycin, IV Flagyl, IV azithromycin, and IV zosyn.   DVT prophylaxis: SCDs; Not on chemical DVT ppx due to thrombocytopenia.   Kayleen Memos, MD [facility] Pager [phone]-  If 7PM-7AM, please contact night-coverage [email or web address] Password [email or web address] 11/08/2016, 12:45 PM

## 2016-11-08 NOTE — Progress Notes (Signed)
Went into pt's room to do a bipap check, pt was off bipap and on a non rebreather. Vital signs stable at this time. RT will closely monitor pt.

## 2016-11-08 NOTE — Progress Notes (Signed)
Transported pt on BIPAP to dialysis.

## 2016-11-08 NOTE — Progress Notes (Signed)
Pt off BiPAP, placed on non rebreathe 15L. Pt tolerating well, VS stable. RN will continue to monitor pt.

## 2016-11-08 NOTE — Progress Notes (Signed)
[facility] Progress Note  Subjective: on bipap still, "I haven't eaten in 3 days", no curretn c/o's. On HD now, goal 4 L   Vitals:   11/08/16 0922 11/08/16 0930 11/08/16 1000 11/08/16 1030  BP: 127/76 124/73 123/72 (!) 135/58  Pulse: 82 82 80 79  Resp: (!) 22 20 18 18   Temp: 97.8 F (36.6 C)     TempSrc: Oral     SpO2: 100% 100% 100% 100%  Weight: 97.8 kg (215 lb 9.8 oz)     Height:        Inpatient medications: . amLODipine  10 mg Oral Daily  . aspirin EC  81 mg Oral Daily  . calcitRIOL  0.25 mcg Oral Q M,W,F-HD  . calcitRIOL  1.25 mcg Oral Once per day on Mon Wed Fri  . cholecalciferol  1,000 Units Oral Daily  . clonazePAM  0.5 mg Oral BID  . famotidine  20 mg Oral Q24H  . feeding supplement (PRO-STAT SUGAR FREE 64)  30 mL Oral BID  . guaiFENesin  600 mg Oral BID  . hydrALAZINE  50 mg Oral Q8H  . insulin aspart  0-9 Units Subcutaneous TID WC  . ipratropium-albuterol  3 mL Nebulization QID  . mouth rinse  15 mL Mouth Rinse BID  . metoprolol tartrate  75 mg Oral BID  . mometasone-formoterol  2 puff Inhalation BID  . multivitamin  1 tablet Oral QHS  . pravastatin  40 mg Oral QPM  . sevelamer carbonate  2,400 mg Oral TID WC  . vancomycin  500 mg Oral Q6H   . azithromycin Stopped (11/07/16 2228)  . metronidazole Stopped (11/08/16 0502)  . piperacillin-tazobactam (ZOSYN)  IV Stopped (11/08/16 0803)   acetaminophen **OR** acetaminophen, diphenhydrAMINE, guaiFENesin-dextromethorphan, HYDROcodone-acetaminophen, ipratropium-albuterol, metoCLOPramide, ondansetron **OR** ondansetron (ZOFRAN) IV, traMADol  Exam: Alert, no distress, not toxic   no jvd Chest clear to bases bilat, on bipap, not in distress RRR no mrg Abd soft ntnd no ascites Ext no edema or wounds NF, ox 3 LFA AVF +thrill  CXR - no active disease   Dialysis: MWF [place] ([facility]) 4h 36min  100kg  2/2  Hep 4000  LFA AVF - Calcitriol 1.86mcg PO q HD - Venofer 50mg  IV weekly - BMM  meds: Phoslo 4/meals      Impression: 1. SOB - CXR's and CT looked like worsening CHF, did HD yest and getting HD again today, get vol down and dry out lungs, lower dry wt.   2. Fevers/ diarrhea/ CDif enteritis - fevers better.  3. HTN - cont metop/ amlod/ hydralazine. Lowering dosing.  4. Vol - as above 5. Anemia - Hb 11, no need esa 6. MBD cont meds 7. COPD 8. Thrombocytopenia - recurrent issue.  DC'd  heparin with HD due to prolonged bleeding.    Plan - HD again today, CXR looking better today but still has ^vol to get off.  Wean off bipap after HD today.    Kelly Splinter MD [facility] pager [phone]   11/08/2016, 11:33 AM    Recent Labs Lab 11/06/16 0500 11/07/16 0549 11/08/16 0200  NA 134* 132* 134*  K 4.5 5.0 3.5  CL 94* 93* 96*  CO2 26 21* 24  GLUCOSE 99 120* 111*  BUN 42* 59* 30*  CREATININE 13.19* 15.82* 12.37*  CALCIUM 8.4* 7.9* 8.1*    Recent Labs Lab 11/04/16 0420 11/07/16 0549 11/08/16 0200  AST 26 33 26  ALT 22 37 34  ALKPHOS 79 72  77  BILITOT 1.1 1.3* 1.1  PROT 6.9 7.1 7.0  ALBUMIN 3.3* 3.2* 3.3*    Recent Labs Lab 11/06/16 0500 11/07/16 1410 11/08/16 0200  WBC 3.3* 5.6 3.9*  NEUTROABS 2.0 4.5 2.8  HGB 11.6* 12.0* 11.2*  HCT 36.1* 35.7* 33.9*  MCV 94.3 92.7 92.6  PLT 65* 74* 74*   Iron/TIBC/Ferritin/ %Sat    Component Value Date/Time   IRON 90 01/19/2010 1016   TIBC 225 01/19/2010 1016   FERRITIN 318 01/19/2010 1016   IRONPCTSAT 40 01/19/2010 1016

## 2016-11-09 ENCOUNTER — Inpatient Hospital Stay (HOSPITAL_COMMUNITY): Payer: Medicare Other

## 2016-11-09 LAB — CBC WITH DIFFERENTIAL/PLATELET
BASOS ABS: 0 10*3/uL (ref 0.0–0.1)
BASOS PCT: 1 %
EOS ABS: 0.2 10*3/uL (ref 0.0–0.7)
EOS PCT: 4 %
HCT: 36.6 % — ABNORMAL LOW (ref 39.0–52.0)
HEMOGLOBIN: 11.6 g/dL — AB (ref 13.0–17.0)
LYMPHS ABS: 0.9 10*3/uL (ref 0.7–4.0)
Lymphocytes Relative: 22 %
MCH: 30.2 pg (ref 26.0–34.0)
MCHC: 31.7 g/dL (ref 30.0–36.0)
MCV: 95.3 fL (ref 78.0–100.0)
Monocytes Absolute: 0.6 10*3/uL (ref 0.1–1.0)
Monocytes Relative: 16 %
NEUTROS PCT: 58 %
Neutro Abs: 2.3 10*3/uL (ref 1.7–7.7)
PLATELETS: 90 10*3/uL — AB (ref 150–400)
RBC: 3.84 MIL/uL — AB (ref 4.22–5.81)
RDW: 14.5 % (ref 11.5–15.5)
WBC: 3.9 10*3/uL — AB (ref 4.0–10.5)

## 2016-11-09 LAB — BASIC METABOLIC PANEL
ANION GAP: 14 (ref 5–15)
BUN: 25 mg/dL — ABNORMAL HIGH (ref 6–20)
CALCIUM: 9.1 mg/dL (ref 8.9–10.3)
CO2: 28 mmol/L (ref 22–32)
CREATININE: 11.03 mg/dL — AB (ref 0.61–1.24)
Chloride: 94 mmol/L — ABNORMAL LOW (ref 101–111)
GFR calc Af Amer: 5 mL/min — ABNORMAL LOW (ref >60)
GFR calc non Af Amer: 5 mL/min — ABNORMAL LOW (ref >60)
GLUCOSE: 86 mg/dL (ref 65–99)
Potassium: 3.8 mmol/L (ref 3.5–5.1)
Sodium: 136 mmol/L (ref 135–145)

## 2016-11-09 LAB — GLUCOSE, CAPILLARY
GLUCOSE-CAPILLARY: 204 mg/dL — AB (ref 65–99)
Glucose-Capillary: 94 mg/dL (ref 65–99)
Glucose-Capillary: 112 mg/dL — ABNORMAL HIGH (ref 65–99)
Glucose-Capillary: 127 mg/dL — ABNORMAL HIGH (ref 65–99)

## 2016-11-09 MED ORDER — HYDROCODONE-ACETAMINOPHEN 5-325 MG PO TABS
ORAL_TABLET | ORAL | Status: AC
  Administered 2016-11-09: 2 via ORAL
  Filled 2016-11-09: qty 2

## 2016-11-09 MED ORDER — PENTAFLUOROPROP-TETRAFLUOROETH EX AERO: 1.0000 "application " | INHALATION_SPRAY | CUTANEOUS | Status: DC | PRN

## 2016-11-09 MED ORDER — METOPROLOL TARTRATE 25 MG PO TABS
25.0000 mg | ORAL_TABLET | Freq: Two times a day (BID) | ORAL | Status: DC
  Administered 2016-11-09: 25 mg via ORAL
  Filled 2016-11-09: qty 1

## 2016-11-09 MED ORDER — CLONAZEPAM 0.5 MG PO TABS
ORAL_TABLET | ORAL | Status: AC
  Administered 2016-11-09: 0.5 mg via ORAL
  Filled 2016-11-09: qty 1

## 2016-11-09 MED ORDER — HEPARIN SODIUM (PORCINE) 1000 UNIT/ML DIALYSIS: 4000.0000 [IU] | Freq: Once | INTRAMUSCULAR | Status: DC

## 2016-11-09 MED ORDER — HEPARIN SODIUM (PORCINE) 1000 UNIT/ML DIALYSIS: 1000.0000 [IU] | INTRAMUSCULAR | Status: DC | PRN

## 2016-11-09 MED ORDER — CALCITRIOL 0.25 MCG PO CAPS
ORAL_CAPSULE | ORAL | Status: AC
  Filled 2016-11-09: qty 1

## 2016-11-09 MED ORDER — CALCITRIOL 0.5 MCG PO CAPS
ORAL_CAPSULE | ORAL | Status: AC
  Administered 2016-11-09: 1.25 ug via ORAL
  Filled 2016-11-09: qty 1

## 2016-11-09 MED ORDER — LIDOCAINE HCL (PF) 1 % IJ SOLN: 5.0000 mL | INTRAMUSCULAR | Status: DC | PRN

## 2016-11-09 MED ORDER — ALTEPLASE 2 MG IJ SOLR: 2.0000 mg | Freq: Once | INTRAMUSCULAR | Status: DC | PRN

## 2016-11-09 MED ORDER — LIDOCAINE-PRILOCAINE 2.5-2.5 % EX CREA: 1.0000 "application " | TOPICAL_CREAM | CUTANEOUS | Status: DC | PRN

## 2016-11-09 MED ORDER — SODIUM CHLORIDE 0.9 % IV SOLN: 100.0000 mL | INTRAVENOUS | Status: DC | PRN

## 2016-11-09 NOTE — Progress Notes (Signed)
Pt requested to be placed back on BiPAP, VS stable sats 98%. Will continued to monitor pt.

## 2016-11-09 NOTE — Care Management Note (Signed)
Case Management Note Previous Note Created by Olga Coaster  Patient Details  Name: Kirk [name] MRN: [id number] Date of Birth: 12-23-1960  Subjective/Objective:  Dyspnea                 Action/Plan: 11/05/2016 - CM following for DCP; B Pennie Rushing   09/13/2016-    Pt is from home, lives with wife and daughter. Pt is ind with ADL's. He has PCP, drives himself to appointments, has insurance with drug coverage. He has CPAP and neb machine, no home oxygen. Pt gets HD at [facility] in [place] three days a week. Family at bedside. S Hunn[place]utt RN CM  Expected Discharge Date:   possibly 11/08/2016               Expected Discharge Plan:  Home/Self Care  In-House Referral:   [facility]  Discharge planning Services  CM Consult   Status of Service:  In process, will continue to follow  Karlyne Greenspan [phone]  11/09/2016, 4:03 PM  Pt remains on HFNC - pt may need home oxygen.  CM will continue to follow for discharge needs

## 2016-11-09 NOTE — Progress Notes (Signed)
PROGRESS NOTE  Kirk [name] HFW:[id number] DOB: [birth year] DOA: 11/02/2016 PCP: Gayland Curry, DO  HPI/Recap of past 24 hours: Kirk [name] is a 56 year old male with past medical history significant for end-stage renal disease on hemodialysis Monday Wednesday Friday, diastolic CHF EF 02-77% (TTE 09/19/16), hypertension, obstructive sleep apnea on CPAP at home, diabetes, who presented 8 days ago on 10/31/2016 with complaints of gradually worsening dyspnea, coughing, abdominal pain, and diarrhea. Patient was admitted with acute hypoxic respiratory failure secondary to acute pulmonary edema, fluid overload, and acute C. difficile enteritis.  Overnight, no acute events. Pt seen and examined at dialysis center. Admits to productive cough that is improving. Denies chest pain, abdominal pain, or diarrhea. Reports 1 bowel movement with formed stool this morning.  Physical exam:  General: 56 year old male, lying in the dialysis bed on NRB mask, in no apparent acute distress. HEENT: No pallor no JVD present. Head is normocephalic, atraumatic.  Respiratory: Mild rhonchi bilaterally, improved from yesterday. Cardiovascular: Regular rate and rhythm with no murmurs, rubs, or gallops Abdomen: + bowel sounds x 4 quadrants. No tenderness on palpation. Musculoskeletal: No edema; left upper extremity AV fistula; onychomycosis noted in toenails. Neuro: alert and oriented x3. No noted motor deficits. Skin: No lesions or rash noted. Psychiatric: Mood is appropriate for condition and circumstances   Objective: Vitals:   11/09/16 0302 11/09/16 0500 11/09/16 0547 11/09/16 0651  BP:   139/80 126/86  Pulse: 72  79 78  Resp: (!) 21  18 16   Temp:   98 F (36.7 C)   TempSrc:   Oral   SpO2: 95%  97% 100%  Weight:  94.2 kg (207 lb 10.8 oz)    Height:        Intake/Output Summary (Last 24 hours) at 11/09/16 0725 Last data filed at 11/08/16 1400  Gross per 24 hour  Intake              120 ml  Output                 0 ml  Net              120 ml   Filed Weights   11/08/16 0922 11/08/16 1315 11/09/16 0500  Weight: 97.8 kg (215 lb 9.8 oz) 92.7 kg (204 lb 5.9 oz) 94.2 kg (207 lb 10.8 oz)     Data Reviewed: CBC:  Recent Labs Lab 11/05/16 0509 11/06/16 0500 11/07/16 1410 11/08/16 0200 11/09/16 0349  WBC 3.1* 3.3* 5.6 3.9* 3.9*  NEUTROABS 1.6* 2.0 4.5 2.8 2.3  HGB 10.4* 11.6* 12.0* 11.2* 11.6*  HCT 32.9* 36.1* 35.7* 33.9* 36.6*  MCV 95.1 94.3 92.7 92.6 95.3  PLT 59* 65* 74* 74* 90*   Basic Metabolic Panel:  Recent Labs Lab 11/04/16 0420 11/05/16 0509 11/06/16 0500 11/07/16 0549 11/08/16 0200 11/09/16 0349  NA 136 135 134* 132* 134* 136  K 4.2 4.2 4.5 5.0 3.5 3.8  CL 95* 94* 94* 93* 96* 94*  CO2 25 22 26  21* 24 28  GLUCOSE 105* 102* 99 120* 111* 86  BUN 63* 80* 42* 59* 30* 25*  CREATININE 15.35* 18.16* 13.19* 15.82* 12.37* 11.03*  CALCIUM 7.9* 7.7* 8.4* 7.9* 8.1* 9.1  MG 2.1 2.5* 2.3 2.3  --   --    GFR: Estimated Creatinine Clearance: 8.4 mL/min (A) (by C-G formula based on SCr of 11.03 mg/dL (H)). Liver Function Tests:  Recent Labs Lab 11/02/16 0840 11/04/16 0420 11/07/16 0549 11/08/16 0200  AST 27 26 33 26  ALT 19 22 37 34  ALKPHOS 88 79 72 77  BILITOT 0.9 1.1 1.3* 1.1  PROT 6.3* 6.9 7.1 7.0  ALBUMIN 3.5 3.3* 3.2* 3.3*    Recent Labs Lab 11/02/16 0801  LIPASE 74*   No results for input(s): AMMONIA in the last 168 hours. Coagulation Profile:  Recent Labs Lab 11/02/16 0840  INR 1.06   Cardiac Enzymes:  Recent Labs Lab 11/07/16 1126  TROPONINI <0.03   BNP (last 3 results) No results for input(s): PROBNP in the last 8760 hours. HbA1C: No results for input(s): HGBA1C in the last 72 hours. CBG:  Recent Labs Lab 11/07/16 2110 11/08/16 0759 11/08/16 1406 11/08/16 1654 11/08/16 2125  GLUCAP 100* 95 160* 122* 96   Lipid Profile: No results for input(s): CHOL, HDL, LDLCALC, TRIG, CHOLHDL, LDLDIRECT in the last 72 hours. Thyroid  Function Tests: No results for input(s): TSH, T4TOTAL, FREET4, T3FREE, THYROIDAB in the last 72 hours. Anemia Panel: No results for input(s): VITAMINB12, FOLATE, FERRITIN, TIBC, IRON, RETICCTPCT in the last 72 hours. Urine analysis:    Component Value Date/Time   COLORURINE YELLOW 11/06/2016 0814   APPEARANCEUR CLOUDY (A) 11/06/2016 0814   LABSPEC 1.015 11/06/2016 0814   PHURINE 6.0 11/06/2016 0814   GLUCOSEU NEGATIVE 11/06/2016 0814   HGBUR SMALL (A) 11/06/2016 0814   BILIRUBINUR NEGATIVE 11/06/2016 0814   KETONESUR NEGATIVE 11/06/2016 0814   PROTEINUR >=300 (A) 11/06/2016 0814   UROBILINOGEN 0.2 04/06/2014 1030   NITRITE NEGATIVE 11/06/2016 0814   LEUKOCYTESUR SMALL (A) 11/06/2016 0814   Sepsis Labs: @LABRCNTIP (procalcitonin:4,lacticidven:4)  ) Recent Results (from the past 240 hour(s))  MRSA PCR Screening     Status: None   Collection Time: 10/31/16  1:46 PM  Result Value Ref Range Status   MRSA by PCR NEGATIVE NEGATIVE Final    Comment:        The GeneXpert MRSA Assay (FDA approved for NASAL specimens only), is one component of a comprehensive MRSA colonization surveillance program. It is not intended to diagnose MRSA infection nor to guide or monitor treatment for MRSA infections.   Culture, blood (Routine x 2)     Status: None   Collection Time: 11/02/16  6:20 AM  Result Value Ref Range Status   Specimen Description BLOOD RIGHT ARM  Final   Special Requests   Final    BOTTLES DRAWN AEROBIC AND ANAEROBIC Blood Culture adequate volume   Culture NO GROWTH 5 DAYS  Final   Report Status 11/07/2016 FINAL  Final  Culture, blood (Routine x 2)     Status: None   Collection Time: 11/02/16  6:33 AM  Result Value Ref Range Status   Specimen Description BLOOD RIGHT HAND  Final   Special Requests IN PEDIATRIC BOTTLE Blood Culture adequate volume  Final   Culture NO GROWTH 5 DAYS  Final   Report Status 11/07/2016 FINAL  Final  Culture, blood (routine x 2)     Status:  None   Collection Time: 11/02/16  5:00 PM  Result Value Ref Range Status   Specimen Description BLOOD HEMODIALYSIS FISTULA  Final   Special Requests   Final    BOTTLES DRAWN AEROBIC AND ANAEROBIC Blood Culture adequate volume   Culture NO GROWTH 5 DAYS  Final   Report Status 11/07/2016 FINAL  Final  MRSA PCR Screening     Status: None   Collection Time: 11/02/16  9:40 PM  Result Value Ref Range Status   MRSA by  PCR NEGATIVE NEGATIVE Final    Comment:        The GeneXpert MRSA Assay (FDA approved for NASAL specimens only), is one component of a comprehensive MRSA colonization surveillance program. It is not intended to diagnose MRSA infection nor to guide or monitor treatment for MRSA infections.   Urine culture     Status: None   Collection Time: 11/06/16  8:14 AM  Result Value Ref Range Status   Specimen Description URINE, CLEAN CATCH  Final   Special Requests NONE  Final   Culture NO GROWTH  Final   Report Status 11/07/2016 FINAL  Final  Respiratory Panel by PCR     Status: None   Collection Time: 11/07/16  3:31 PM  Result Value Ref Range Status   Adenovirus NOT DETECTED NOT DETECTED Final   Coronavirus 229E NOT DETECTED NOT DETECTED Final   Coronavirus HKU1 NOT DETECTED NOT DETECTED Final   Coronavirus NL63 NOT DETECTED NOT DETECTED Final   Coronavirus OC43 NOT DETECTED NOT DETECTED Final   Metapneumovirus NOT DETECTED NOT DETECTED Final   Rhinovirus / Enterovirus NOT DETECTED NOT DETECTED Final   Influenza A NOT DETECTED NOT DETECTED Final   Influenza B NOT DETECTED NOT DETECTED Final   Parainfluenza Virus 1 NOT DETECTED NOT DETECTED Final   Parainfluenza Virus 2 NOT DETECTED NOT DETECTED Final   Parainfluenza Virus 3 NOT DETECTED NOT DETECTED Final   Parainfluenza Virus 4 NOT DETECTED NOT DETECTED Final   Respiratory Syncytial Virus NOT DETECTED NOT DETECTED Final   Bordetella pertussis NOT DETECTED NOT DETECTED Final   Chlamydophila pneumoniae NOT DETECTED NOT  DETECTED Final   Mycoplasma pneumoniae NOT DETECTED NOT DETECTED Final      Studies: Dg Chest Port 1 View  Result Date: 11/08/2016 CLINICAL DATA:  Dyspnea in a renal failure patient. EXAM: PORTABLE CHEST 1 VIEW COMPARISON:  CT chest 11/06/2016. Single-view of the chest 02/08/2016. PA and lateral chest 11/04/2016. FINDINGS: There is cardiomegaly. Patchy bilateral airspace disease does not appear changed compared to yesterday's examination but has improved since 11/04/2016. No pneumothorax or pleural effusion. No acute bony abnormality. IMPRESSION: Patchy bilateral airspace disease most consistent with pneumonia appears unchanged since yesterday's study but is improved since 11/04/2016. Cardiomegaly. Electronically Signed   By: Inge Rise M.D.   On: 11/08/2016 09:23    Scheduled Meds: . amLODipine  5 mg Oral Daily  . aspirin EC  81 mg Oral Daily  . calcitRIOL  0.25 mcg Oral Q M,W,F-HD  . calcitRIOL  1.25 mcg Oral Once per day on Mon Wed Fri  . cholecalciferol  1,000 Units Oral Daily  . clonazePAM  0.5 mg Oral BID  . famotidine  20 mg Oral Q24H  . feeding supplement (PRO-STAT SUGAR FREE 64)  30 mL Oral BID  . guaiFENesin  600 mg Oral BID  . hydrALAZINE  50 mg Oral Q8H  . insulin aspart  0-9 Units Subcutaneous TID WC  . ipratropium-albuterol  3 mL Nebulization QID  . mouth rinse  15 mL Mouth Rinse BID  . metoprolol tartrate  50 mg Oral BID  . mometasone-formoterol  2 puff Inhalation BID  . multivitamin  1 tablet Oral QHS  . pravastatin  40 mg Oral QPM  . sevelamer carbonate  2,400 mg Oral TID WC  . vancomycin  500 mg Oral Q6H    Continuous Infusions: . azithromycin Stopped (11/08/16 2334)  . metronidazole Stopped (11/09/16 0516)  . piperacillin-tazobactam (ZOSYN)  IV 3.375 g (11/09/16 0416)  LOS: 7 days   Assessment/Plan: Principal Problem: Dyspnea Active Problems:  Acute hypoxic respiratory failure requiring noninvasive mechanical ventilation most likely  multifactorial secondary to multilobular HCAP, present on admission versus acute pulmonary edema from fluid overload -Hemodialysis today to help with fluid overload/acute pulmonary edema. Nephrology plans to remove 2-2.5L fluid. -CPAP at night -Wean off BIPAP and O2  -Continue to monitor O2 saturation -Continue duonebs, dulera, and guafeinesen -Continue IV zosyn and IV azithromycin  Multilobular HCAP, present on admission -Improving; -Blood cx no growth -Management as stated above  Acute Clostridium difficile enteritis, present on admission -Improving -Continue treatment with oral vancomycin day #9 and IV Flagyl day#4 -Continue to monitor I's and O with stool frequency  OSA on CPAP at home -CPAP at night  Chest pain -Resolved  Acute hyponatremia -Resolved -Repeat BMP in the am.  End-stage renal disease on dialysis Monday Wednesday Friday -Nephrology following and planning to remove 2-2.5L of fluid today at dialysis.  Pancytopenia -Stable -Continue to monitor; repeat CBC in the morning  HTN (hypertension) -Stable -Improving after hemodialysis  DM (diabetes mellitus), type 2 with renal complications (HCC) -Stable  Anemia in chronic renal disease -Stable  Diastolic CHF - Last echo EF 55-60% 09/19/16  HLD (hyperlipidemia) -Stable -Resume pravastatin  Obesity hypoventilation syndrome (HCC) -CPAP at night -Continue to monitor PCO2 levels  Fatigue/generalized weakness -Improving -Most likely multifactorial secondary to acute Clostridium difficile infection, acute hypoxic respiratory failure, chronic anemia, end-stage renal disease on hemodialysis. -Continue Physical therapy  Diet: Resume hemodyalisis diet, per dietitian.   Code Status:Full code  Family Communication:Yes, with wife. All questions answered to her satisfaction.  Disposition Plan:Will stay another midnight to continue treatment for multifocal HCAP, present on  admission, and to continue treatment for c-diff enteritis, also present on admission.   Consultants: Nephrologyand GI  Procedures: None  Antimicrobials:Oral vancomycin, IV Flagyl, IV azithromycin, and IV zosyn.   DVT prophylaxis: SCDs; Not on chemical DVT ppx due to thrombocytopenia.  Kayleen Memos, MD [facility] Pager [phone]  If 7PM-7AM, please contact night-coverage [email or web address] Password [email or web address] 11/09/2016, 7:25 AM

## 2016-11-09 NOTE — Progress Notes (Signed)
Pt has muscle cramps, but pain med is not working for him. Pt went to bathroom then c/o of headache administered tramadol for it. Pt was desaturated 88-89% with [place] 4L and increased 6L, but no improvement. Applied non-rebreathing mask with O2 10L with SpO2 went up to 95-99%. HS [employer]

## 2016-11-09 NOTE — Progress Notes (Signed)
Pt off BiPAP, placed on non rebreathe 15L.Marland Kitchen RN will continue to monitor pt.

## 2016-11-09 NOTE — Progress Notes (Signed)
Patient arrived to unit per bed.  Reviewed treatment plan and this RN agrees.  Report received from bedside RN, Hye.  Consent verified.  Patient A & O X 4. Lung sounds diminished to ausculation in all fields. BLE 1+ edema. Cardiac: NSr.  Prepped LLAVF with alcohol and cannulated with two 15 gauge needles.  Pulsation of blood noted.  Flushed access well with saline per protocol.  Connected and secured lines and initiated tx at 1039.  UF goal of 3000 mL and net fluid removal of 2500 mL.  Will continue to monitor.

## 2016-11-09 NOTE — Progress Notes (Signed)
Dialysis treatment completed.  1354 mL ultrafiltrated and net fluid removal 754 mL.    Patient status unchanged. Lung sounds diminished to ausculation in all fields. Generalized edema. Cardiac: NSR.  Disconnected lines and removed needles.  Pressure held for 10 minutes and band aid/gauze dressing applied.  Report given to bedside RN, Inez Catalina.

## 2016-11-09 NOTE — Consult Note (Signed)
   [facility] CM Inpatient Consult   11/09/2016  Kirk [name] [birth year] [id number]   Patient with a previous HX with [facility] in the [place].Came by but the patient was Off the floor. Patient in the Medicare ACO.Follow at a later time.  Natividad Brood, RN BSN [facility] Liaison  [phone] business mobile phone Toll free office [phone]

## 2016-11-09 NOTE — Progress Notes (Signed)
[facility] Progress Note  Subjective: looks much better overall, off of bipap finally and on FM O2.  Coughing a fair amount.    Vitals:   11/09/16 0547 11/09/16 0651 11/09/16 0737 11/09/16 0816  BP: 139/80 126/86 124/69   Pulse: 79 78 74   Resp: 18 16 (!) 28   Temp: 98 F (36.7 C)  97.6 F (36.4 C)   TempSrc: Oral  Oral   SpO2: 97% 100%  100%  Weight:      Height:        Inpatient medications: . amLODipine  5 mg Oral Daily  . aspirin EC  81 mg Oral Daily  . calcitRIOL  0.25 mcg Oral Q M,W,F-HD  . calcitRIOL  1.25 mcg Oral Once per day on Mon Wed Fri  . cholecalciferol  1,000 Units Oral Daily  . clonazePAM  0.5 mg Oral BID  . famotidine  20 mg Oral Q24H  . feeding supplement (PRO-STAT SUGAR FREE 64)  30 mL Oral BID  . guaiFENesin  600 mg Oral BID  . hydrALAZINE  50 mg Oral Q8H  . insulin aspart  0-9 Units Subcutaneous TID WC  . ipratropium-albuterol  3 mL Nebulization QID  . mouth rinse  15 mL Mouth Rinse BID  . metoprolol tartrate  50 mg Oral BID  . mometasone-formoterol  2 puff Inhalation BID  . multivitamin  1 tablet Oral QHS  . pravastatin  40 mg Oral QPM  . sevelamer carbonate  2,400 mg Oral TID WC  . vancomycin  500 mg Oral Q6H   . azithromycin Stopped (11/08/16 2334)  . metronidazole Stopped (11/09/16 0516)  . piperacillin-tazobactam (ZOSYN)  IV Stopped (11/09/16 0816)   acetaminophen **OR** acetaminophen, diphenhydrAMINE, guaiFENesin-dextromethorphan, HYDROcodone-acetaminophen, ipratropium-albuterol, metoCLOPramide, ondansetron **OR** ondansetron (ZOFRAN) IV, traMADol  Exam: Alert, no distress, not toxic   no jvd Chest coarse bilat rales at bases,occ wheezes RRR no mrg Abd soft ntnd no ascites Ext no edema or wounds NF, ox 3 LFA AVF +thrill  CXR - no active disease   Dialysis: MWF [facility] ([facility]) 4h 61min  100kg  2/2  Hep 4000  LFA AVF - Calcitriol 1.106mcg PO q HD - Venofer 50mg  IV weekly - BMM meds: Phoslo 4/meals       Impression: 1. SOB / resp distress - resolving now with serial HD/ vol removal. Off bipap now.  6 kg under dry wt and may still have another 2-3kg to go. HD today, UF 2- 3kg as tol.   2. Fevers/ diarrhea/ CDif enteritis - fevers better. Per primary.  3. HTN - cont metop/ amlod/ hydralazine. Lowering dosing.  4. Vol - as above 5. Anemia - Hb 11, no need esa 6. MBD cont meds 7. COPD 8. Thrombocytopenia - recurrent issue.  DC'd  heparin with HD due to prolonged bleeding.    Plan - HD today, as above.   Kelly Splinter MD [facility] pager [phone]   11/09/2016, 9:43 AM    Recent Labs Lab 11/07/16 0549 11/08/16 0200 11/09/16 0349  NA 132* 134* 136  K 5.0 3.5 3.8  CL 93* 96* 94*  CO2 21* 24 28  GLUCOSE 120* 111* 86  BUN 59* 30* 25*  CREATININE 15.82* 12.37* 11.03*  CALCIUM 7.9* 8.1* 9.1    Recent Labs Lab 11/04/16 0420 11/07/16 0549 11/08/16 0200  AST 26 33 26  ALT 22 37 34  ALKPHOS 79 72 77  BILITOT 1.1 1.3* 1.1  PROT 6.9 7.1 7.0  ALBUMIN  3.3* 3.2* 3.3*    Recent Labs Lab 11/07/16 1410 11/08/16 0200 11/09/16 0349  WBC 5.6 3.9* 3.9*  NEUTROABS 4.5 2.8 2.3  HGB 12.0* 11.2* 11.6*  HCT 35.7* 33.9* 36.6*  MCV 92.7 92.6 95.3  PLT 74* 74* 90*   Iron/TIBC/Ferritin/ %Sat    Component Value Date/Time   IRON 90 01/19/2010 1016   TIBC 225 01/19/2010 1016   FERRITIN 318 01/19/2010 1016   IRONPCTSAT 40 01/19/2010 1016

## 2016-11-10 LAB — GLUCOSE, CAPILLARY
GLUCOSE-CAPILLARY: 134 mg/dL — AB (ref 65–99)
GLUCOSE-CAPILLARY: 120 mg/dL — AB (ref 65–99)
Glucose-Capillary: 118 mg/dL — ABNORMAL HIGH (ref 65–99)
Glucose-Capillary: 141 mg/dL — ABNORMAL HIGH (ref 65–99)

## 2016-11-10 LAB — CBC WITH DIFFERENTIAL/PLATELET
BASOS PCT: 0 %
Basophils Absolute: 0 10*3/uL (ref 0.0–0.1)
EOS PCT: 7 %
Eosinophils Absolute: 0.3 10*3/uL (ref 0.0–0.7)
HEMATOCRIT: 37.6 % — AB (ref 39.0–52.0)
Hemoglobin: 12.3 g/dL — ABNORMAL LOW (ref 13.0–17.0)
Lymphocytes Relative: 22 %
Lymphs Abs: 1 10*3/uL (ref 0.7–4.0)
MCH: 30.9 pg (ref 26.0–34.0)
MCHC: 32.7 g/dL (ref 30.0–36.0)
MCV: 94.5 fL (ref 78.0–100.0)
MONO ABS: 0.6 10*3/uL (ref 0.1–1.0)
MONOS PCT: 14 %
NEUTROS ABS: 2.7 10*3/uL (ref 1.7–7.7)
Neutrophils Relative %: 57 %
Platelets: 115 10*3/uL — ABNORMAL LOW (ref 150–400)
RBC: 3.98 MIL/uL — ABNORMAL LOW (ref 4.22–5.81)
RDW: 14.1 % (ref 11.5–15.5)
WBC: 4.7 10*3/uL (ref 4.0–10.5)

## 2016-11-10 LAB — BASIC METABOLIC PANEL
Anion gap: 14 (ref 5–15)
BUN: 27 mg/dL — ABNORMAL HIGH (ref 6–20)
CALCIUM: 9.1 mg/dL (ref 8.9–10.3)
CO2: 27 mmol/L (ref 22–32)
CREATININE: 8.76 mg/dL — AB (ref 0.61–1.24)
Chloride: 92 mmol/L — ABNORMAL LOW (ref 101–111)
GFR calc non Af Amer: 6 mL/min — ABNORMAL LOW (ref >60)
GFR, EST AFRICAN AMERICAN: 7 mL/min — AB (ref >60)
GLUCOSE: 107 mg/dL — AB (ref 65–99)
Potassium: 3.6 mmol/L (ref 3.5–5.1)
Sodium: 133 mmol/L — ABNORMAL LOW (ref 135–145)

## 2016-11-10 MED ORDER — METOPROLOL TARTRATE 12.5 MG HALF TABLET
12.5000 mg | ORAL_TABLET | Freq: Two times a day (BID) | ORAL | Status: DC
  Administered 2016-11-10 – 2016-11-11 (×3): 12.5 mg via ORAL
  Filled 2016-11-10 (×3): qty 1

## 2016-11-10 MED ORDER — METOCLOPRAMIDE HCL 5 MG/ML IJ SOLN
5.0000 mg | Freq: Once | INTRAMUSCULAR | Status: AC
  Administered 2016-11-10: 5 mg via INTRAVENOUS
  Filled 2016-11-10: qty 2

## 2016-11-10 MED ORDER — HYDRALAZINE HCL 50 MG PO TABS
25.0000 mg | ORAL_TABLET | Freq: Three times a day (TID) | ORAL | Status: DC
  Filled 2016-11-10: qty 1

## 2016-11-10 MED ORDER — KETOROLAC TROMETHAMINE 15 MG/ML IJ SOLN
15.0000 mg | Freq: Once | INTRAMUSCULAR | Status: AC
  Administered 2016-11-10: 15 mg via INTRAVENOUS
  Filled 2016-11-10: qty 1

## 2016-11-10 MED ORDER — DIPHENHYDRAMINE HCL 50 MG/ML IJ SOLN
25.0000 mg | Freq: Once | INTRAMUSCULAR | Status: AC
  Administered 2016-11-10: 25 mg via INTRAVENOUS
  Filled 2016-11-10: qty 1

## 2016-11-10 NOTE — Progress Notes (Signed)
Patient stated he would place CPAP on when he is ready for bed. RT informed patient to call if he needs assistance. RT will monitor as needed.

## 2016-11-10 NOTE — Progress Notes (Signed)
[facility] Progress Note  Subjective: not requiring bipap, still wheezing.  Cramped early in HD yest and "all night" as well.  Wt 92.5kg today. F/u CXR yest showed improved vasc congestion/ IS edema  Vitals:   11/10/16 0428 11/10/16 0500 11/10/16 0800 11/10/16 0814  BP: 116/69     Pulse: 63     Resp: 16     Temp: 97.8 F (36.6 C)  97.6 F (36.4 C)   TempSrc: Axillary  Axillary   SpO2: 99%   96%  Weight:  92.4 kg (203 lb 11.3 oz)    Height:        Inpatient medications: . amLODipine  5 mg Oral Daily  . aspirin EC  81 mg Oral Daily  . calcitRIOL  0.25 mcg Oral Q M,W,F-HD  . calcitRIOL  1.25 mcg Oral Once per day on Mon Wed Fri  . cholecalciferol  1,000 Units Oral Daily  . clonazePAM  0.5 mg Oral BID  . famotidine  20 mg Oral Q24H  . feeding supplement (PRO-STAT SUGAR FREE 64)  30 mL Oral BID  . guaiFENesin  600 mg Oral BID  . hydrALAZINE  50 mg Oral Q8H  . insulin aspart  0-9 Units Subcutaneous TID WC  . ipratropium-albuterol  3 mL Nebulization QID  . mouth rinse  15 mL Mouth Rinse BID  . metoprolol tartrate  25 mg Oral BID  . mometasone-formoterol  2 puff Inhalation BID  . multivitamin  1 tablet Oral QHS  . pravastatin  40 mg Oral QPM  . sevelamer carbonate  2,400 mg Oral TID WC  . vancomycin  500 mg Oral Q6H   . azithromycin Stopped (11/09/16 2225)  . metronidazole Stopped (11/10/16 0542)  . piperacillin-tazobactam (ZOSYN)  IV Stopped (11/10/16 3419)   acetaminophen **OR** acetaminophen, diphenhydrAMINE, guaiFENesin-dextromethorphan, HYDROcodone-acetaminophen, ipratropium-albuterol, metoCLOPramide, ondansetron **OR** ondansetron (ZOFRAN) IV, traMADol  Exam: Alert, no distress, not toxic   no jvd Chest coarse bilat rales at bases,occ wheezes RRR no mrg Abd soft ntnd no ascites Ext no edema or wounds NF, ox 3 LFA AVF +thrill  CXR - no active disease   Dialysis: MWF [place] ([facility]) 4h 42min  100kg  2/2  Hep 4000  LFA AVF - Calcitriol  1.79mcg PO q HD - Venofer 50mg  IV weekly - BMM meds: Phoslo 4/meals      Impression: 1. Vol overload/ pulm edema - resolved with HD, down 6 kg from prior dry wt.  Cramped hard on HD yest, no more excess volume to get.  2. Diarrhea/ CDif enteritis - per primary.  3. Wheezing / pulm infiltrates - some of this was vol overload but has no volume left now and still wheezing.  Per wife has hx of asthma but was never a serious issue.  4. [facility] HD MWF - HD Monday.  5. HTN - BP's down w/ vol off, have dc'd norvasc and lowered metop/ hydral dosing.  6. Vol - as above 7. Anemia - Hb 11, no need esa 8. MBD cont meds 9. COPD 10. Thrombocytopenia - recurrent issue.  DC'd  heparin with HD due to prolonged bleeding.    Plan - as above  Kelly Splinter MD [facility] pager [phone]   11/10/2016, 9:41 AM    Recent Labs Lab 11/08/16 0200 11/09/16 0349 11/10/16 0237  NA 134* 136 133*  K 3.5 3.8 3.6  CL 96* 94* 92*  CO2 24 28 27   GLUCOSE 111* 86 107*  BUN 30* 25* 27*  CREATININE 12.37* 11.03* 8.76*  CALCIUM 8.1* 9.1 9.1    Recent Labs Lab 11/04/16 0420 11/07/16 0549 11/08/16 0200  AST 26 33 26  ALT 22 37 34  ALKPHOS 79 72 77  BILITOT 1.1 1.3* 1.1  PROT 6.9 7.1 7.0  ALBUMIN 3.3* 3.2* 3.3*    Recent Labs Lab 11/08/16 0200 11/09/16 0349 11/10/16 0237  WBC 3.9* 3.9* 4.7  NEUTROABS 2.8 2.3 2.7  HGB 11.2* 11.6* 12.3*  HCT 33.9* 36.6* 37.6*  MCV 92.6 95.3 94.5  PLT 74* 90* 115*   Iron/TIBC/Ferritin/ %Sat    Component Value Date/Time   IRON 90 01/19/2010 1016   TIBC 225 01/19/2010 1016   FERRITIN 318 01/19/2010 1016   IRONPCTSAT 40 01/19/2010 1016

## 2016-11-10 NOTE — Evaluation (Signed)
Physical Therapy Evaluation Patient Details Name: Kirk [name] MRN: [id number] DOB: October 26, 1960 Today's Date: 11/10/2016   History of Present Illness  56 year old male with history of ESRD on dialysis, hypertension, obstructive sleep apnea, COPD, diastolic CHF, GERD, chronic headache, pneumonia and recent hospitalization to [facility] and discharge on 11/01/2016 for C. difficile colitis treated with oral vancomycin. He presented on 11/02/2016 with fever and shortness of breath. He was admitted and started on broad-spectrum antibiotics.  PT saw pt on 10/14 and pt was independent and they did not follow pt for PT.  On 10/15 and 10/17 pt has had several syncopal episodes and respiratory issues.  PT was reconsulted 11/09/16.   Clinical Impression  Pt admitted with above diagnosis. Pt currently with functional limitations due to the deficits listed below (see PT Problem List). Pt was unable to participate more than sit EOB less than 10 seconds.  Pt dizzy and refused to do more.  Was on CPAP. Pt impulsive with movement as well. Will follow as pt tolerates.   Pt will benefit from skilled PT to increase their independence and safety with mobility to allow discharge to the venue listed below.      Follow Up Recommendations Home health PT;Supervision/Assistance - 24 hour    Equipment Recommendations  Other (comment) (TBA)    Recommendations for Other Services       Precautions / Restrictions Precautions Precautions: Fall Precaution Comments: Enteric Prec, pt on CPAP on arrival.  Restrictions Weight Bearing Restrictions: No      Mobility  Bed Mobility Overal bed mobility: Needs Assistance Bed Mobility: Rolling;Sidelying to Sit Rolling: Independent Sidelying to sit: Min guard       General bed mobility comments: Pt able to sit up without assist however pt only sat for 10 seconds and then flung himself back onto his side to lie back down.  Pt states he has been dizzy every time he  sits up and he can't breathe.  Pt refused to do anything else with PT.  He did state that he had sat up a little bit ago and tried to eat and could only eat a few bites before he had to lie back down.    Transfers                 General transfer comment: refused to do more than sit EOB  Ambulation/Gait                Stairs            Wheelchair Mobility    Modified Rankin (Stroke Patients Only)       Balance                                             Pertinent Vitals/Pain Pain Assessment: No/denies pain    Home Living Family/patient expects to be discharged to:: Private residence Living Arrangements: Spouse/significant other Available Help at Discharge: Family;Available PRN/intermittently Type of Home: House Home Access: Stairs to enter Entrance Stairs-Rails: Right (reports no difficulty) Entrance Stairs-Number of Steps: 3 Home Layout: Two level;Able to live on main level with bedroom/bathroom Home Equipment: None      Prior Function Level of Independence: Independent         Comments: Drives self to HD     Hand Dominance        Extremity/Trunk Assessment  Upper Extremity Assessment Upper Extremity Assessment: Defer to OT evaluation    Lower Extremity Assessment Lower Extremity Assessment: Generalized weakness    Cervical / Trunk Assessment Cervical / Trunk Assessment: Kyphotic  Communication   Communication: No difficulties  Cognition Arousal/Alertness: Lethargic Behavior During Therapy: Flat affect;Impulsive Overall Cognitive Status: No family/caregiver present to determine baseline cognitive functioning                                        General Comments General comments (skin integrity, edema, etc.): VSS during bed level and sitting.  Was going to take a pressure in sitting since pt has been dizzy but pt could not sit long enough to get a pressure.      Exercises      Assessment/Plan    PT Assessment Patient needs continued PT services  PT Problem List Decreased activity tolerance;Decreased balance;Decreased mobility;Decreased knowledge of use of DME;Decreased safety awareness;Decreased knowledge of precautions       PT Treatment Interventions DME instruction;Gait training;Functional mobility training;Therapeutic activities;Therapeutic exercise;Balance training;Patient/family education    PT Goals (Current goals can be found in the Care Plan section)  Acute Rehab PT Goals Patient Stated Goal: Feel better PT Goal Formulation: With patient Time For Goal Achievement: 11/24/16 Potential to Achieve Goals: Good    Frequency Min 3X/week   Barriers to discharge        Co-evaluation               AM-PAC PT "6 Clicks" Daily Activity  Outcome Measure Difficulty turning over in bed (including adjusting bedclothes, sheets and blankets)?: None Difficulty moving from lying on back to sitting on the side of the bed? : None Difficulty sitting down on and standing up from a chair with arms (e.g., wheelchair, bedside commode, etc,.)?: Unable Help needed moving to and from a bed to chair (including a wheelchair)?: Total Help needed walking in hospital room?: Total Help needed climbing 3-5 steps with a railing? : Total 6 Click Score: 12    End of Session Equipment Utilized During Treatment: Oxygen (CPAP) Activity Tolerance: Patient limited by fatigue;Patient limited by lethargy (limited by dizziness) Patient left: in bed;with call bell/phone within reach Nurse Communication: Mobility status PT Visit Diagnosis: Unsteadiness on feet (R26.81);Muscle weakness (generalized) (M62.81);Dizziness and giddiness (R42)    Time: 8588-5027 PT Time Calculation (min) (ACUTE ONLY): 10 min   Charges:   PT Evaluation $PT Eval Moderate Complexity: 1 Mod     PT G Codes:        Sovereign Ramiro,PT Acute Rehabilitation [phone] (pager)   Denice Paradise 11/10/2016, 11:04 AM

## 2016-11-10 NOTE — Progress Notes (Signed)
PROGRESS NOTE  Kirk [name] IFO:[id number] DOB: 11/28/1960 DOA: 11/02/2016 PCP: Gayland Curry, DO  HPI/Recap of past 24 hours: Kirk [name] is a 56 year old male with past medical history significant for end-stage renal disease on hemodialysis Monday Wednesday Friday, diastolic CHF EF 67-67% (TTE 09/19/16), hypertension, obstructive sleep apnea on CPAP at home, diabetes, who presented 8days ago on 11/02/2016 with complaints of gradually worsening dyspnea, coughing, abdominal pain, and diarrhea. Patient was admitted with acute hypoxic respiratory failure secondary to acute pulmonary edema, fluid overload,and acute C. difficile enteritis.  Overnight, patient used his CPAP and tolerated well.  Efforts have been made to wean him off his oxygen.  He was not on oxygen supplementation at home.  This morning patient reports persistent weakness and poor appetite. He was encouraged to make an effort to increase his p.o. calorie Intake.  He denies nausea, vomiting, abdominal pain.  Stool frequency has remained the same. Denies chest pain, dyspnea, or palpitations.     Physical exam:  General: 56 year old male, lying in the bed somnolent wearing CPAP, in no apparent acute distress. Blood sugar 144. HEENT: No pallor no JVD present. Head is normocephalic,atraumatic.  Respiratory: Mildrhonchi at bases bilaterally, improved from yesterday. Cardiovascular: Regular rate and rhythm with no murmurs,rubs,or gallops Abdomen: + bowel sounds x 4 quadrants. No tenderness on palpation. Musculoskeletal: No edema; left upper extremity AV fistula; onychomycosis noted in toenails. Neuro: alert and oriented x3. No noted motor deficits. Skin: No lesions or rash noted. Psychiatric: Mood is appropriate for condition and circumstances  Objective: Vitals:   11/10/16 0800 11/10/16 0814 11/10/16 1143 11/10/16 1200  BP: 121/82   109/71  Pulse:    85  Resp:    16  Temp: 97.6 F (36.4 C)   98.1 F (36.7 C)    TempSrc: Axillary   Oral  SpO2:  96% 96% 92%  Weight:      Height:        Intake/Output Summary (Last 24 hours) at 11/10/16 1551 Last data filed at 11/10/16 1330  Gross per 24 hour  Intake             1320 ml  Output                0 ml  Net             1320 ml   Filed Weights   11/09/16 1027 11/09/16 1352 11/10/16 0500  Weight: 98.2 kg (216 lb 7.9 oz) 97.5 kg (214 lb 15.2 oz) 92.4 kg (203 lb 11.3 oz)     Data Reviewed: CBC:  Recent Labs Lab 11/06/16 0500 11/07/16 1410 11/08/16 0200 11/09/16 0349 11/10/16 0237  WBC 3.3* 5.6 3.9* 3.9* 4.7  NEUTROABS 2.0 4.5 2.8 2.3 2.7  HGB 11.6* 12.0* 11.2* 11.6* 12.3*  HCT 36.1* 35.7* 33.9* 36.6* 37.6*  MCV 94.3 92.7 92.6 95.3 94.5  PLT 65* 74* 74* 90* 209*   Basic Metabolic Panel:  Recent Labs Lab 11/04/16 0420 11/05/16 0509 11/06/16 0500 11/07/16 0549 11/08/16 0200 11/09/16 0349 11/10/16 0237  NA 136 135 134* 132* 134* 136 133*  K 4.2 4.2 4.5 5.0 3.5 3.8 3.6  CL 95* 94* 94* 93* 96* 94* 92*  CO2 25 22 26  21* 24 28 27   GLUCOSE 105* 102* 99 120* 111* 86 107*  BUN 63* 80* 42* 59* 30* 25* 27*  CREATININE 15.35* 18.16* 13.19* 15.82* 12.37* 11.03* 8.76*  CALCIUM 7.9* 7.7* 8.4* 7.9* 8.1* 9.1 9.1  MG 2.1 2.5*  2.3 2.3  --   --   --    GFR: Estimated Creatinine Clearance: 10.4 mL/min (A) (by C-G formula based on SCr of 8.76 mg/dL (H)). Liver Function Tests:  Recent Labs Lab 11/04/16 0420 11/07/16 0549 11/08/16 0200  AST 26 33 26  ALT 22 37 34  ALKPHOS 79 72 77  BILITOT 1.1 1.3* 1.1  PROT 6.9 7.1 7.0  ALBUMIN 3.3* 3.2* 3.3*   No results for input(s): LIPASE, AMYLASE in the last 168 hours. No results for input(s): AMMONIA in the last 168 hours. Coagulation Profile: No results for input(s): INR, PROTIME in the last 168 hours. Cardiac Enzymes:  Recent Labs Lab 11/07/16 1126  TROPONINI <0.03   BNP (last 3 results) No results for input(s): PROBNP in the last 8760 hours. HbA1C: No results for input(s): HGBA1C  in the last 72 hours. CBG:  Recent Labs Lab 11/09/16 1421 11/09/16 1741 11/09/16 2129 11/10/16 0801 11/10/16 1212  GLUCAP 204* 112* 127* 118* 141*   Lipid Profile: No results for input(s): CHOL, HDL, LDLCALC, TRIG, CHOLHDL, LDLDIRECT in the last 72 hours. Thyroid Function Tests: No results for input(s): TSH, T4TOTAL, FREET4, T3FREE, THYROIDAB in the last 72 hours. Anemia Panel: No results for input(s): VITAMINB12, FOLATE, FERRITIN, TIBC, IRON, RETICCTPCT in the last 72 hours. Urine analysis:    Component Value Date/Time   COLORURINE YELLOW 11/06/2016 0814   APPEARANCEUR CLOUDY (A) 11/06/2016 0814   LABSPEC 1.015 11/06/2016 0814   PHURINE 6.0 11/06/2016 0814   GLUCOSEU NEGATIVE 11/06/2016 0814   HGBUR SMALL (A) 11/06/2016 0814   BILIRUBINUR NEGATIVE 11/06/2016 0814   KETONESUR NEGATIVE 11/06/2016 0814   PROTEINUR >=300 (A) 11/06/2016 0814   UROBILINOGEN 0.2 04/06/2014 1030   NITRITE NEGATIVE 11/06/2016 0814   LEUKOCYTESUR SMALL (A) 11/06/2016 0814   Sepsis Labs: @LABRCNTIP (procalcitonin:4,lacticidven:4)  ) Recent Results (from the past 240 hour(s))  Culture, blood (Routine x 2)     Status: None   Collection Time: 11/02/16  6:20 AM  Result Value Ref Range Status   Specimen Description BLOOD RIGHT ARM  Final   Special Requests   Final    BOTTLES DRAWN AEROBIC AND ANAEROBIC Blood Culture adequate volume   Culture NO GROWTH 5 DAYS  Final   Report Status 11/07/2016 FINAL  Final  Culture, blood (Routine x 2)     Status: None   Collection Time: 11/02/16  6:33 AM  Result Value Ref Range Status   Specimen Description BLOOD RIGHT HAND  Final   Special Requests IN PEDIATRIC BOTTLE Blood Culture adequate volume  Final   Culture NO GROWTH 5 DAYS  Final   Report Status 11/07/2016 FINAL  Final  Culture, blood (routine x 2)     Status: None   Collection Time: 11/02/16  5:00 PM  Result Value Ref Range Status   Specimen Description BLOOD HEMODIALYSIS FISTULA  Final   Special  Requests   Final    BOTTLES DRAWN AEROBIC AND ANAEROBIC Blood Culture adequate volume   Culture NO GROWTH 5 DAYS  Final   Report Status 11/07/2016 FINAL  Final  MRSA PCR Screening     Status: None   Collection Time: 11/02/16  9:40 PM  Result Value Ref Range Status   MRSA by PCR NEGATIVE NEGATIVE Final    Comment:        The GeneXpert MRSA Assay (FDA approved for NASAL specimens only), is one component of a comprehensive MRSA colonization surveillance program. It is not intended to diagnose MRSA infection  nor to guide or monitor treatment for MRSA infections.   Urine culture     Status: None   Collection Time: 11/06/16  8:14 AM  Result Value Ref Range Status   Specimen Description URINE, CLEAN CATCH  Final   Special Requests NONE  Final   Culture NO GROWTH  Final   Report Status 11/07/2016 FINAL  Final  Respiratory Panel by PCR     Status: None   Collection Time: 11/07/16  3:31 PM  Result Value Ref Range Status   Adenovirus NOT DETECTED NOT DETECTED Final   Coronavirus 229E NOT DETECTED NOT DETECTED Final   Coronavirus HKU1 NOT DETECTED NOT DETECTED Final   Coronavirus NL63 NOT DETECTED NOT DETECTED Final   Coronavirus OC43 NOT DETECTED NOT DETECTED Final   Metapneumovirus NOT DETECTED NOT DETECTED Final   Rhinovirus / Enterovirus NOT DETECTED NOT DETECTED Final   Influenza A NOT DETECTED NOT DETECTED Final   Influenza B NOT DETECTED NOT DETECTED Final   Parainfluenza Virus 1 NOT DETECTED NOT DETECTED Final   Parainfluenza Virus 2 NOT DETECTED NOT DETECTED Final   Parainfluenza Virus 3 NOT DETECTED NOT DETECTED Final   Parainfluenza Virus 4 NOT DETECTED NOT DETECTED Final   Respiratory Syncytial Virus NOT DETECTED NOT DETECTED Final   Bordetella pertussis NOT DETECTED NOT DETECTED Final   Chlamydophila pneumoniae NOT DETECTED NOT DETECTED Final   Mycoplasma pneumoniae NOT DETECTED NOT DETECTED Final      Studies: Dg Chest 2 View  Result Date: 11/09/2016 CLINICAL  DATA:  Follow-up pulmonary opacities. EXAM: CHEST  2 VIEW COMPARISON:  11/08/2016 FINDINGS: The cardiac silhouette is upper limits of normal in size. Patchy bilateral airspace opacities have mildly improved, most notably in the right lung base. No pleural effusion or pneumothorax is identified. No acute osseous abnormality is seen. IMPRESSION: Decreased airspace opacities in the right lung compatible with improving pneumonia. Electronically Signed   By: Logan Bores M.D.   On: 11/09/2016 16:04    Scheduled Meds: . aspirin EC  81 mg Oral Daily  . calcitRIOL  0.25 mcg Oral Q M,W,F-HD  . calcitRIOL  1.25 mcg Oral Once per day on Mon Wed Fri  . cholecalciferol  1,000 Units Oral Daily  . clonazePAM  0.5 mg Oral BID  . famotidine  20 mg Oral Q24H  . feeding supplement (PRO-STAT SUGAR FREE 64)  30 mL Oral BID  . guaiFENesin  600 mg Oral BID  . hydrALAZINE  25 mg Oral Q8H  . insulin aspart  0-9 Units Subcutaneous TID WC  . ipratropium-albuterol  3 mL Nebulization QID  . mouth rinse  15 mL Mouth Rinse BID  . metoprolol tartrate  12.5 mg Oral BID  . mometasone-formoterol  2 puff Inhalation BID  . multivitamin  1 tablet Oral QHS  . pravastatin  40 mg Oral QPM  . sevelamer carbonate  2,400 mg Oral TID WC  . vancomycin  500 mg Oral Q6H    Continuous Infusions: . azithromycin Stopped (11/09/16 2225)  . metronidazole Stopped (11/10/16 1059)  . piperacillin-tazobactam (ZOSYN)  IV Stopped (11/10/16 0842)     LOS: 8 days   Assessment/Plan: Principal Problem: Dyspnea Active Problems:  Acute hypoxic respiratory failure most likely multifactorial secondary to multilobular HCAP, present on admission versus acute pulmonary edema from fluid overload -Improving; afebrile, no leukocytosis -Hemodialysis MWF -CPAP at night -Wean off O2  -Continue to monitor O2 saturation -Continue duonebs, dulera, and guafeinesen -Stop IV zosyn and IV azithromycin; completed treatment  Multilobular HCAP,  present on admission -Improving; -Management as stated above  Acute Clostridium difficile enteritis, present on admission -Improving -Continue treatment with oral vancomycin and IV Flagyl -Continue to monitor I's and O with stool frequency  OSA on CPAP at home -CPAP at night  Chest pain -Resolved  Acute hyponatremia -Resolved -Repeat BMPin the am.  End-stage renal disease on dialysis  -Monday Wednesday Friday -Keep dialysis schedule  Pancytopenia -platelets level improving -Continue to monitor; repeat CBC in the morning  HTN (hypertension) -Stable -Improving after hemodialysis  DM (diabetes mellitus), type 2 with renal complications (HCC) -Stable  Anemia in chronic renal disease -Stable  Diastolic CHF - Last echo EF 55-60% 09/19/16  HLD (hyperlipidemia) -pravastatin  Obesity hypoventilation syndrome (HCC) -CPAP at night  Fatigue/generalized weakness -Improving -Most likely multifactorial secondary to acute Clostridium difficile infection, acute hypoxic respiratory failure, chronic anemia, end-stage renal disease on hemodialysis. -Continue Physical therapy and increase po calorie intake.  Diet: Hemodyalisis diet, per dietitian.   Code Status:Full code  Family Communication:Yes, with wife. All questions answered to her satisfaction.  Disposition Plan:Will stay another midnight to continue treatment forc-diff enteritis, present on admission and to improve symptoms..  Consultants: Nephrologyand GI  Procedures: None  Antimicrobials:Oral vancomycin, IV Flagyl   DVT prophylaxis: SCDs; Not on chemical DVT ppx due to thrombocytopenia.    Kayleen Memos, MD [facility] Pager [phone]-  If 7PM-7AM, please contact night-coverage [email or web address] Password [email or web address] 11/10/2016, 3:51 PM

## 2016-11-11 ENCOUNTER — Inpatient Hospital Stay (HOSPITAL_COMMUNITY): Payer: Medicare Other

## 2016-11-11 DIAGNOSIS — E662 Morbid (severe) obesity with alveolar hypoventilation: Secondary | ICD-10-CM

## 2016-11-11 LAB — GLUCOSE, CAPILLARY
GLUCOSE-CAPILLARY: 107 mg/dL — AB (ref 65–99)
GLUCOSE-CAPILLARY: 152 mg/dL — AB (ref 65–99)

## 2016-11-11 MED ORDER — IPRATROPIUM-ALBUTEROL 0.5-2.5 (3) MG/3ML IN SOLN
3.0000 mL | Freq: Three times a day (TID) | RESPIRATORY_TRACT | Status: DC
  Filled 2016-11-11: qty 3

## 2016-11-11 MED ORDER — VANCOMYCIN 50 MG/ML ORAL SOLUTION
125.0000 mg | Freq: Four times a day (QID) | ORAL | 0 refills | Status: DC
Start: 2016-11-11 — End: 2016-11-11

## 2016-11-11 MED ORDER — SODIUM CHLORIDE 3 % IN NEBU
4.0000 mL | INHALATION_SOLUTION | Freq: Every day | RESPIRATORY_TRACT | Status: DC
  Filled 2016-11-11: qty 4

## 2016-11-11 MED ORDER — PRO-STAT SUGAR FREE PO LIQD: 30.0000 mL | Freq: Two times a day (BID) | ORAL | 0 refills | Status: DC

## 2016-11-11 NOTE — Progress Notes (Signed)
Discharge instructions given to patient and wife, all questions answered at this time.  Pt. And patients wife acknowledged understanding to fill Vancomycin prescription and to not resume taking previous prescription.  Pt. VSS with no s/s of distress noted.  Patient stable at discharge.  All belonging sent with patient.

## 2016-11-11 NOTE — Discharge Summary (Addendum)
Discharge Summary  Kirk [name] GQQ:[id number] DOB: 08-16-1960  PCP: Gayland Curry, DO  Admit date: 11/02/2016 Discharge date: 11/11/2016  Time spent: 25 minutes  Recommendations for Outpatient Follow-up:  1. Follow-up with PCP within a week 2. Take your medications as prescribed 3. CPAP at night 4. Hemodialysis Monday, Wednesday, and Friday  Discharge Diagnoses:  Active Hospital Problems   Diagnosis Date Noted  . Dyspnea 06/23/2014  . Hyponatremia   . Clostridium difficile enteritis 10/31/2016  . Fatigue 06/29/2015  . Obesity hypoventilation syndrome ([place]) 12/09/2014  . ESRD on dialysis ([place]) 09/09/2014  . HLD (hyperlipidemia) 09/09/2014  . Pancytopenia ([place]) 04/28/2014  . Thrombocytopenia ([place]) 04/10/2014  . Anemia in chronic renal disease   . DM (diabetes mellitus), type 2 with renal complications ([place])   . Edema   . HTN (hypertension)   . Obesity 08/16/2011    Resolved Hospital Problems   Diagnosis Date Noted Date Resolved  No resolved problems to display.    Discharge Condition: Stable; hypoxia has resolved; saturating 95% on room air.  Diet recommendation: Hemodialysis that  Vitals:   11/11/16 1013 11/11/16 1202  BP:  119/62  Pulse:  75  Resp:  (!) 21  Temp: 97.6 F (36.4 C) 97.7 F (36.5 C)  SpO2:  96%    History of present illness:  Kirk [name] is a 56 year old male with past medical history significant for end-stage renal disease on hemodialysis Monday Wednesday Friday, diastolic CHF EF 67-12% (TTE 09/19/16), hypertension, obstructive sleep apnea on CPAP at home, diabetes, who presented 9days ago on 11/02/2016 with complaints of gradually worsening dyspnea, coughing, abdominal pain, and diarrhea. Patient was admitted with acute hypoxic respiratory failure secondary to acute pulmonary edema and fluid overload,also for once recurring C. difficile enteritis.  Patient had one episode of headache rated 10/10 yesterday which resolved after a  cocktail of IV Toradol, IV Benadryl, and IV Reglan.  Wore CPAP overnight with no acute events. This morning, the patient reports feels a lot better. Denies any chest pain, dyspnea, palpitation, abdominal pain, or nausea. Reports formed stools last night.  Afebrile with no leukocytosis.  Patient has been followed by physical therapy with recommendations for home health with PT. To note, the patient's wife has declined home health with PT stating that she will take care of his physical therapy herself.  Patient and wife were counseled on the importance of compliance with follow-up appointments and asked to make appointment with primary care provider. To take medications as prescribed and also not to miss hemodialysis appointmnets Monday, Wednesday, and Friday.  Hospital Course:  Principal Problem:   Dyspnea Active Problems:   HTN (hypertension)   DM (diabetes mellitus), type 2 with renal complications (HCC)   Anemia in chronic renal disease   Edema   Obesity   Thrombocytopenia (HCC)   Pancytopenia (HCC)   ESRD on dialysis ([place])   HLD (hyperlipidemia)   Obesity hypoventilation syndrome (HCC)   Fatigue   Clostridium difficile enteritis   Hyponatremia  Acute hypoxic respiratory failure most likely multifactorial secondary to multilobular HCAP, present on admission versus acute pulmonary edema from fluid overload -Resolved -Hemodialysis MWF -CPAP at night -Completed treatment for HCAP, present on admission, with IV zosyn and IV azithromycin; DuoNeb's treatment, Dulera inhalers, hypersaline nebs, guaifenesin, and chest PT.  Multilobular HCAP, present on admission -Management as stated above  Acute Clostridium difficile enteritis, present on admission -Recurrent x 1 -Continue treatment with oral vancomycin 125 mg p.o. every 6 hours times x 10  days -Continue to monitor stool frequency and form  OSA on CPAP at home -CPAPat night  Chest pain -Resolved  Acute  hyponatremia -Resolved  End-stage renal disease on dialysis  -Monday Wednesday Friday -Keep dialysis schedule  Pancytopenia -platelets level improving -No sign of mucosal bleeding  HTN (hypertension) -Stable -Improved after hemodialysis  DM (diabetes mellitus), type 2 with renal complications (HCC) -No acute issues  Anemia in chronic renal disease -Stable  Diastolic CHF - Last echo EF 55-60% 09/19/16  HLD (hyperlipidemia) -pravastatin  Obesity hypoventilation syndrome (HCC) -CPAP at night -Recommend weight loss with healthy diet and regular exercise  Fatigue/generalized weakness -Improving -Continue physical therapy and increase po calorie intake.  Diet: Hemodyalisisdiet, per dietitian.  Procedures:  None  Consultations:  Nephrology  Discharge Exam: BP 119/62   Pulse 75   Temp 97.7 F (36.5 C) (Oral)   Resp (!) 21   Ht 5\' 7"  (1.702 m)   Wt 94.5 kg (208 lb 5.4 oz)   SpO2 96%   BMI 32.63 kg/m   General: 56 year old male sitting up in bed on room air acute distress Cardiovascular: Regular rate and rhythm with no murmurs rubs or gallops Respiratory: Clear to auscultation with no wheezes or rhonchi noted  Discharge Instructions You were cared for by a hospitalist during your hospital stay. If you have any questions about your discharge medications or the care you received while you were in the hospital after you are discharged, you can call the unit and asked to speak with the hospitalist on call if the hospitalist that took care of you is not available. Once you are discharged, your primary care physician will handle any further medical issues. Please note that NO REFILLS for any discharge medications will be authorized once you are discharged, as it is imperative that you return to your primary care physician (or establish a relationship with a primary care physician if you do not have one) for your aftercare needs so that they can  reassess your need for medications and monitor your lab values.   Allergies as of 11/11/2016      Reactions   Codeine Nausea And Vomiting, Nausea Only   Altered mental status      Medication List    TAKE these medications   acetaminophen 500 MG tablet Commonly known as:  TYLENOL Take 1 tablet (500 mg total) by mouth every 6 (six) hours as needed for moderate pain.   amLODipine 10 MG tablet Commonly known as:  NORVASC One twice daily to control BP   aspirin EC 81 MG tablet Take 81 mg by mouth daily.   b complex-vitamin c-folic acid 0.8 MG Tabs tablet TAKE 1 TABLET BY MOUTH EVERY DAY   BENADRYL 25 MG tablet Generic drug:  diphenhydrAMINE Take 25 mg by mouth at bedtime as needed for allergies.   budesonide-formoterol 80-4.5 MCG/ACT inhaler Commonly known as:  SYMBICORT Inhale 2 puffs into the lungs 2 (two) times daily.   calcitRIOL 0.25 MCG capsule Commonly known as:  ROCALTROL Take 1 capsule (0.25 mcg total) by mouth every Monday, Wednesday, and Friday with hemodialysis.   clonazePAM 0.5 MG tablet Commonly known as:  KLONOPIN Take 1 tablet (0.5 mg total) by mouth 2 (two) times daily.   feeding supplement (PRO-STAT SUGAR FREE 64) Liqd Take 30 mLs by mouth 2 (two) times daily.   guaiFENesin 600 MG 12 hr tablet Commonly known as:  MUCINEX Take 600 mg by mouth 2 (two) times daily.   guaiFENesin-dextromethorphan 100-10 MG/5ML  syrup Commonly known as:  ROBITUSSIN DM Take 10 mLs by mouth every 4 (four) hours as needed for cough.   hydrALAZINE 50 MG tablet Commonly known as:  APRESOLINE Take 1 tablet (50 mg total) by mouth every 8 (eight) hours.   metoCLOPramide 10 MG tablet Commonly known as:  REGLAN Take 10 mg by mouth as needed for nausea.   metoprolol tartrate 50 MG tablet Commonly known as:  LOPRESSOR Take One and a half tablet by mouth twice daily   omeprazole 20 MG capsule Commonly known as:  PRILOSEC TAKE 1 CAPSULE (20 MG TOTAL) BY MOUTH 2 (TWO) TIMES  DAILY BEFORE A MEAL.   ondansetron 4 MG disintegrating tablet Commonly known as:  ZOFRAN ODT Take 1 tablet (4 mg total) by mouth every 8 (eight) hours as needed for nausea or vomiting.   pravastatin 40 MG tablet Commonly known as:  PRAVACHOL Take 1 tablet (40 mg total) by mouth every evening.   sevelamer carbonate 800 MG tablet Commonly known as:  RENVELA Take 3 tablets (2,400 mg total) by mouth 3 (three) times daily with meals.   traMADol 50 MG tablet Commonly known as:  ULTRAM Take 1 tablet (50 mg total) by mouth daily as needed (severe headache).   vancomycin 125 MG capsule Commonly known as:  VANCOCIN Take 1 capsule (125 mg total) by mouth 4 (four) times daily.   Vitamin D3 5000 units Tabs Take 1 tablet by mouth daily.      Allergies  Allergen Reactions  . Codeine Nausea And Vomiting and Nausea Only    Altered mental status      The results of significant diagnostics from this hospitalization (including imaging, microbiology, ancillary and laboratory) are listed below for reference.    Significant Diagnostic Studies: Ct Abdomen Pelvis Wo Contrast  Result Date: 11/05/2016 CLINICAL DATA:  Abdomen distension EXAM: CT ABDOMEN AND PELVIS WITHOUT CONTRAST TECHNIQUE: Multidetector CT imaging of the abdomen and pelvis was performed following the standard protocol without IV contrast. COMPARISON:  11/04/2016, 07/02/2014, 02/17/2014, 09/08/2013 FINDINGS: Lower chest: Lung bases demonstrate no consolidation or effusion. Mild centrilobular density within the bilateral lower lobes and right middle lobe. Small ground-glass nodular density in the right lower lobe. Borderline cardiomegaly. Hepatobiliary: No focal liver abnormality is seen. Status post cholecystectomy. No biliary dilatation. Pancreas: Unremarkable. No pancreatic ductal dilatation or surrounding inflammatory changes. Spleen: Normal in size without focal abnormality. Adrenals/Urinary Tract: Adrenal glands are within normal  limits. Atrophic native kidneys containing multiple stones. Largest on the left is seen in the lower pole and measures 9 mm. Multiple stones in the right kidney measuring up to 8 mm. No hydronephrosis. 1 cm hypodensity lower pole left kidney, possibly a cyst but further evaluation limited without contrast. Bladder nearly empty. Soft tissue density with calcification and fat density anterior to the right psoas consistent with scarred transplanted kidney. Stomach/Bowel: The stomach is slightly enlarged. No dilated small bowel. Diffuse colon wall thickening with collapsed appearance of the colon. Normal appendix. Vascular/Lymphatic: Aortic atherosclerosis. No enlarged abdominal or pelvic lymph nodes. Reproductive: Prostate is slightly enlarged. Other: Negative for free air or free fluid. Musculoskeletal: No acute or significant osseous findings. IMPRESSION: 1. Negative for bowel obstruction or ascites 2. Diffuse colon wall thickening consistent with history of C difficile colitis. 3. Atrophic native kidneys and scarred appearing right pelvic transplanted kidney. 4. Small ground-glass nodular density in the right lower lobe with centrilobular densities in both lower lobes in the right middle lobe consistent with respiratory infection,  to include atypical entities. 5. Atrophic native kidneys containing multiple stones. Negative for hydronephrosis. Electronically Signed   By: Donavan Foil M.D.   On: 11/05/2016 20:43   Dg Chest 2 View  Result Date: 11/09/2016 CLINICAL DATA:  Follow-up pulmonary opacities. EXAM: CHEST  2 VIEW COMPARISON:  11/08/2016 FINDINGS: The cardiac silhouette is upper limits of normal in size. Patchy bilateral airspace opacities have mildly improved, most notably in the right lung base. No pleural effusion or pneumothorax is identified. No acute osseous abnormality is seen. IMPRESSION: Decreased airspace opacities in the right lung compatible with improving pneumonia. Electronically Signed   By:  Logan Bores M.D.   On: 11/09/2016 16:04   Dg Chest 2 View  Result Date: 11/04/2016 CLINICAL DATA:  Dyspnea. EXAM: CHEST  2 VIEW COMPARISON:  Radiographs of November 02, 2016 FINDINGS: The heart size and mediastinal contours are within normal limits. No pneumothorax or pleural effusion is noted. Left lung is clear. Multifocal airspace opacities are noted in right upper lobe concerning for possible pneumonia. The visualized skeletal structures are unremarkable. IMPRESSION: Interval development of multifocal airspace opacities in right upper lobe concerning for pneumonia. Electronically Signed   By: Marijo Conception, M.D.   On: 11/04/2016 07:40   Dg Chest 2 View  Result Date: 11/02/2016 CLINICAL DATA:  Fever, chest pain, weakness EXAM: CHEST  2 VIEW COMPARISON:  10/31/2016 FINDINGS: Heart and mediastinal contours are within normal limits. No focal opacities or effusions. No acute bony abnormality. IMPRESSION: No active cardiopulmonary disease. Electronically Signed   By: Rolm Baptise M.D.   On: 11/02/2016 08:10   Dg Abd 1 View  Result Date: 11/04/2016 CLINICAL DATA:  Abdominal pain.  Celsius diff colitis. EXAM: ABDOMEN - 1 VIEW COMPARISON:  Body CT 07/02/2014 FINDINGS: The bowel gas pattern is normal. No radio-opaque calculi or other significant radiographic abnormality are seen. Postsurgical changes in the right upper and lower quadrants. IMPRESSION: Nonobstructive bowel gas pattern. Electronically Signed   By: Fidela Salisbury M.D.   On: 11/04/2016 17:08   Ct Chest Wo Contrast  Result Date: 11/06/2016 CLINICAL DATA:  Coughing, short of breath EXAM: CT CHEST WITHOUT CONTRAST TECHNIQUE: Multidetector CT imaging of the chest was performed following the standard protocol without IV contrast. COMPARISON:  10/14/8 FINDINGS: Cardiovascular: No significant vascular findings. Normal heart size. No pericardial effusion. Mediastinum/Nodes: A segmental airspace density in RIGHT upper lobe along the  bronchial tree suggest pneumonia. Someone involvement of the RIGHT middle lobe and RIGHT lower lobe additionally with nodular airspace opacities (image 80, series 5 and image 101, series 5. The LEFT lung is felt clear Lungs/Pleura: No axillary or supraclavicular adenopathy. No mediastinal adenopathy. No pericardial fluid. Upper Abdomen: Limited view of the liver, kidneys, pancreas are unremarkable. Normal adrenal glands. Musculoskeletal: No aggressive osseous lesion. IMPRESSION: 1. RIGHT upper lobe pneumonia with moderate involvement of the RIGHT middle lobe and RIGHT lower lobe consists with multilobar pneumonia. Recommend follow-up CT of the chest in 6 to 8 weeks following antibiotic therapy to demonstrate resolution underlying nodularity and exclude atypical infection. 2. No mediastinal lymphadenopathy. 3. Coronary artery calcification and Aortic Atherosclerosis (ICD10-I70.0). Electronically Signed   By: Suzy Bouchard M.D.   On: 11/06/2016 14:03   Dg Chest Port 1 View  Result Date: 11/08/2016 CLINICAL DATA:  Dyspnea in a renal failure patient. EXAM: PORTABLE CHEST 1 VIEW COMPARISON:  CT chest 11/06/2016. Single-view of the chest 02/08/2016. PA and lateral chest 11/04/2016. FINDINGS: There is cardiomegaly. Patchy bilateral airspace disease does  not appear changed compared to yesterday's examination but has improved since 11/04/2016. No pneumothorax or pleural effusion. No acute bony abnormality. IMPRESSION: Patchy bilateral airspace disease most consistent with pneumonia appears unchanged since yesterday's study but is improved since 11/04/2016. Cardiomegaly. Electronically Signed   By: Inge Rise M.D.   On: 11/08/2016 09:23   Dg Chest Port 1 View  Result Date: 11/07/2016 CLINICAL DATA:  Respiratory distress. EXAM: PORTABLE CHEST 1 VIEW COMPARISON:  11/04/2016 FINDINGS: Stable cardiomegaly with minimal aortic atherosclerosis. Multifocal airspace opacities persist in the right upper and both  lower lobes, similar in appearance without progression. No new findings. No overt pulmonary edema, effusion or pneumothorax. ACDF of the included lower cervical spine. IMPRESSION: 1. Unchanged cardiomegaly with aortic atherosclerosis. 2. Patchy faint airspace opacities in the right upper and both lower lobes remain unchanged. Findings may represent multifocal pneumonia and/or atelectasis. Electronically Signed   By: Ashley Royalty M.D.   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 EXAM: PORTABLE CHEST 1 VIEW COMPARISON:  Chest x-ray of September 18, 2016 FINDINGS: The lungs are well-expanded. The interstitial markings are increased and slightly more conspicuous today. The cardiac silhouette is mildly enlarged. The pulmonary vascularity is engorged and less distinct today. There is calcification in the wall of the aortic arch. The trachea is midline. There is no pleural effusion. The bony thorax exhibits no acute abnormality. IMPRESSION: Acute on chronic CHF. Underlying COPD -reactive airway disease. No acute pneumonia. Thoracic aortic atherosclerosis. Electronically Signed   By: David  Martinique M.D.   On: 10/31/2016 09:03   Dg Chest Port 1v Same Day  Result Date: 11/11/2016 CLINICAL DATA:  Dyspnea EXAM: PORTABLE CHEST 1 VIEW COMPARISON:  11/09/2016 FINDINGS: Bilateral lower lobe interstitial prominence concerning for infection. No pleural effusion or pneumothorax. Stable cardiomediastinal silhouette. No acute osseous abnormality. IMPRESSION: 1. Bilateral lower lobe interstitial prominence concerning for infection. Electronically Signed   By: Kathreen Devoid   On: 11/11/2016 12:09    Microbiology: Recent Results (from the past 240 hour(s))  Culture, blood (Routine x 2)     Status: None   Collection  Time: 11/02/16  6:20 AM  Result Value Ref Range Status   Specimen Description BLOOD RIGHT ARM  Final   Special Requests   Final    BOTTLES DRAWN AEROBIC AND ANAEROBIC Blood Culture adequate volume   Culture NO GROWTH 5 DAYS  Final   Report Status 11/07/2016 FINAL  Final  Culture, blood (Routine x 2)     Status: None   Collection Time: 11/02/16  6:33 AM  Result Value Ref Range Status   Specimen Description BLOOD RIGHT HAND  Final   Special Requests IN PEDIATRIC BOTTLE Blood Culture adequate volume  Final   Culture NO GROWTH 5 DAYS  Final   Report Status 11/07/2016 FINAL  Final  Culture, blood (routine x 2)     Status: None   Collection Time: 11/02/16  5:00 PM  Result Value Ref Range Status   Specimen Description BLOOD HEMODIALYSIS FISTULA  Final   Special Requests   Final    BOTTLES DRAWN AEROBIC AND ANAEROBIC Blood Culture adequate volume   Culture NO GROWTH 5 DAYS  Final   Report Status 11/07/2016 FINAL  Final  MRSA PCR Screening     Status: None   Collection Time:  11/02/16  9:40 PM  Result Value Ref Range Status   MRSA by PCR NEGATIVE NEGATIVE Final    Comment:        The GeneXpert MRSA Assay (FDA approved for NASAL specimens only), is one component of a comprehensive MRSA colonization surveillance program. It is not intended to diagnose MRSA infection nor to guide or monitor treatment for MRSA infections.   Urine culture     Status: None   Collection Time: 11/06/16  8:14 AM  Result Value Ref Range Status   Specimen Description URINE, CLEAN CATCH  Final   Special Requests NONE  Final   Culture NO GROWTH  Final   Report Status 11/07/2016 FINAL  Final  Respiratory Panel by PCR     Status: None   Collection Time: 11/07/16  3:31 PM  Result Value Ref Range Status   Adenovirus NOT DETECTED NOT DETECTED Final   Coronavirus 229E NOT DETECTED NOT DETECTED Final   Coronavirus HKU1 NOT DETECTED NOT DETECTED Final   Coronavirus NL63 NOT DETECTED NOT DETECTED Final    Coronavirus OC43 NOT DETECTED NOT DETECTED Final   Metapneumovirus NOT DETECTED NOT DETECTED Final   Rhinovirus / Enterovirus NOT DETECTED NOT DETECTED Final   Influenza A NOT DETECTED NOT DETECTED Final   Influenza B NOT DETECTED NOT DETECTED Final   Parainfluenza Virus 1 NOT DETECTED NOT DETECTED Final   Parainfluenza Virus 2 NOT DETECTED NOT DETECTED Final   Parainfluenza Virus 3 NOT DETECTED NOT DETECTED Final   Parainfluenza Virus 4 NOT DETECTED NOT DETECTED Final   Respiratory Syncytial Virus NOT DETECTED NOT DETECTED Final   Bordetella pertussis NOT DETECTED NOT DETECTED Final   Chlamydophila pneumoniae NOT DETECTED NOT DETECTED Final   Mycoplasma pneumoniae NOT DETECTED NOT DETECTED Final     Labs: Basic Metabolic Panel:  Recent Labs Lab 11/05/16 0509 11/06/16 0500 11/07/16 0549 11/08/16 0200 11/09/16 0349 11/10/16 0237  NA 135 134* 132* 134* 136 133*  K 4.2 4.5 5.0 3.5 3.8 3.6  CL 94* 94* 93* 96* 94* 92*  CO2 22 26 21* 24 28 27   GLUCOSE 102* 99 120* 111* 86 107*  BUN 80* 42* 59* 30* 25* 27*  CREATININE 18.16* 13.19* 15.82* 12.37* 11.03* 8.76*  CALCIUM 7.7* 8.4* 7.9* 8.1* 9.1 9.1  MG 2.5* 2.3 2.3  --   --   --    Liver Function Tests:  Recent Labs Lab 11/07/16 0549 11/08/16 0200  AST 33 26  ALT 37 34  ALKPHOS 72 77  BILITOT 1.3* 1.1  PROT 7.1 7.0  ALBUMIN 3.2* 3.3*   No results for input(s): LIPASE, AMYLASE in the last 168 hours. No results for input(s): AMMONIA in the last 168 hours. CBC:  Recent Labs Lab 11/06/16 0500 11/07/16 1410 11/08/16 0200 11/09/16 0349 11/10/16 0237  WBC 3.3* 5.6 3.9* 3.9* 4.7  NEUTROABS 2.0 4.5 2.8 2.3 2.7  HGB 11.6* 12.0* 11.2* 11.6* 12.3*  HCT 36.1* 35.7* 33.9* 36.6* 37.6*  MCV 94.3 92.7 92.6 95.3 94.5  PLT 65* 74* 74* 90* 115*   Cardiac Enzymes:  Recent Labs Lab 11/07/16 1126  TROPONINI <0.03   BNP: BNP (last 3 results)  Recent Labs  01/24/16 1740 09/12/16 2338  BNP 182.0* 517.0*    ProBNP (last  3 results) No results for input(s): PROBNP in the last 8760 hours.  CBG:  Recent Labs Lab 11/10/16 1212 11/10/16 1556 11/10/16 2136 11/11/16 0744 11/11/16 1225  GLUCAP 141* 134* 120* 107* 152*  Signed:  Kayleen Memos, MD [facility] 11/11/2016, 2:41 PM

## 2016-11-11 NOTE — Discharge Instructions (Signed)
Abdominal Pain, Adult Many things can cause belly (abdominal) pain. Most times, belly pain is not dangerous. Many cases of belly pain can be watched and treated at home. Sometimes belly pain is serious, though. Your doctor will try to find the cause of your belly pain. Follow these instructions at home:  Take over-the-counter and prescription medicines only as told by your doctor. Do not take medicines that help you poop (laxatives) unless told to by your doctor.  Drink enough fluid to keep your pee (urine) clear or pale yellow.  Watch your belly pain for any changes.  Keep all follow-up visits as told by your doctor. This is important. Contact a doctor if:  Your belly pain changes or gets worse.  You are not hungry, or you lose weight without trying.  You are having trouble pooping (constipated) or have watery poop (diarrhea) for more than 2-3 days.  You have pain when you pee or poop.  Your belly pain wakes you up at night.  Your pain gets worse with meals, after eating, or with certain foods.  You are throwing up and cannot keep anything down.  You have a fever. Get help right away if:  Your pain does not go away as soon as your doctor says it should.  You cannot stop throwing up.  Your pain is only in areas of your belly, such as the right side or the left lower part of the belly.  You have bloody or black poop, or poop that looks like tar.  You have very bad pain, cramping, or bloating in your belly.  You have signs of not having enough fluid or water in your body (dehydration), such as: ? Dark pee, very little pee, or no pee. ? Cracked lips. ? Dry mouth. ? Sunken eyes. ? Sleepiness. ? Weakness. This information is not intended to replace advice given to you by your health care provider. Make sure you discuss any questions you have with your health care provider. Document Released: 06/27/2007 Document Revised: 07/29/2015 Document Reviewed: 06/22/2015 Elsevier  Interactive Patient Education  2017 Elsevier Inc.   Cough, Adult A cough helps to clear your throat and lungs. A cough may last only 2-3 weeks (acute), or it may last longer than 8 weeks (chronic). Many different things can cause a cough. A cough may be a sign of an illness or another medical condition. Follow these instructions at home:  Pay attention to any changes in your cough.  Take medicines only as told by your doctor. ? If you were prescribed an antibiotic medicine, take it as told by your doctor. Do not stop taking it even if you start to feel better. ? Talk with your doctor before you try using a cough medicine.  Drink enough fluid to keep your pee (urine) clear or pale yellow.  If the air is dry, use a cold steam vaporizer or humidifier in your home.  Stay away from things that make you cough at work or at home.  If your cough is worse at night, try using extra pillows to raise your head up higher while you sleep.  Do not smoke, and try not to be around smoke. If you need help quitting, ask your doctor.  Do not have caffeine.  Do not drink alcohol.  Rest as needed. Contact a doctor if:  You have new problems (symptoms).  You cough up yellow fluid (pus).  Your cough does not get better after 2-3 weeks, or your cough gets worse.  Medicine does not help your cough and you are not sleeping well.  You have pain that gets worse or pain that is not helped with medicine.  You have a fever.  You are losing weight and you do not know why.  You have night sweats. Get help right away if:  You cough up blood.  You have trouble breathing.  Your heartbeat is very fast. This information is not intended to replace advice given to you by your health care provider. Make sure you discuss any questions you have with your health care provider. Document Released: 09/21/2010 Document Revised: 06/16/2015 Document Reviewed: 03/17/2014 Elsevier Interactive Patient Education   2018 [place] of Breath, Adult Shortness of breath is when a person has trouble breathing enough air, or when a person feels like she or he is having trouble breathing in enough air. Shortness of breath could be a sign of medical problem. Follow these instructions at home: Pay attention to any changes in your symptoms. Take these actions to help with your condition:  Do not smoke. Smoking is a common cause of shortness of breath. If you smoke and you need help quitting, ask your health care provider.  Avoid things that can irritate your airways, such as: ? Mold. ? Dust. ? Air pollution. ? Chemical fumes. ? Things that can cause allergy symptoms (allergens), if you have allergies.  Keep your living space clean and free of mold and dust.  Rest as needed. Slowly return to your usual activities.  Take over-the-counter and prescription medicines, including oxygen and inhaled medicines, only as told by your health care provider.  Keep all follow-up visits as told by your health care provider. This is important.  Contact a health care provider if:  Your condition does not improve as soon as expected.  You have a hard time doing your normal activities, even after you rest.  You have new symptoms. Get help right away if:  Your shortness of breath gets worse.  You have shortness of breath when you are resting.  You feel light-headed or you faint.  You have a cough that is not controlled with medicines.  You cough up blood.  You have pain with breathing.  You have pain in your chest, arms, shoulders, or abdomen.  You have a fever.  You cannot walk up stairs or exercise the way that you normally do. This information is not intended to replace advice given to you by your health care provider. Make sure you discuss any questions you have with your health care provider. Document Released: 10/03/2000 Document Revised: 07/30/2015 Document Reviewed:  06/16/2015 Elsevier Interactive Patient Education  [employer].

## 2016-11-11 NOTE — Progress Notes (Signed)
Vancomycin on discharge shows patients previous pills to continue.  Contacted Dr. Nevada Crane directed that patient is to begin taking Vancomycin 125mg  of oral solution per prescription to be given at discharge.  Discharge paperwork corrected manually by RN

## 2016-11-11 NOTE — Progress Notes (Signed)
Patient adamant about ambulating in hall this evening due to having limited activity since admission. Upon entering room, patient standing at bedside and noted to be taking a few steps from bed to the sink. Patient ambulated in hall utilizing rolling walking (~5ft from room to nursing station) with two person contact guard assist. Unsteady gait noted during ambulation. Upon return to room, reinforced patient safety education and encouraged patient to contact nursing staff for mobility assistance to maintain safety.

## 2016-11-12 ENCOUNTER — Telehealth: Payer: Self-pay

## 2016-11-12 DIAGNOSIS — D509 Iron deficiency anemia, unspecified: Secondary | ICD-10-CM | POA: Diagnosis not present

## 2016-11-12 DIAGNOSIS — E8779 Other fluid overload: Secondary | ICD-10-CM | POA: Diagnosis not present

## 2016-11-12 DIAGNOSIS — N2581 Secondary hyperparathyroidism of renal origin: Secondary | ICD-10-CM | POA: Diagnosis not present

## 2016-11-12 DIAGNOSIS — N186 End stage renal disease: Secondary | ICD-10-CM | POA: Diagnosis not present

## 2016-11-12 NOTE — Telephone Encounter (Signed)
Transition Care Management Follow-Up Telephone Call   Date discharged and where: [facility] on 11/11/2016  How have you been since you were released from the hospital? Per pt wife-Hanging in there, trying to strengthen himself.  Any patient concerns? None  Items Reviewed:   Meds: Confirmed that liquid vancomycin is needed versus the pill version  Allergies: Y  Dietary Changes Reviewed: Y  Functional Questionnaire:  Independent-I Dependent-D  ADLs:   Dressing- I    Eating- I   Maintaining continence- 65% continent with bowels   Transferring- I   Transportation- D   Meal Prep- D   Managing Meds- I w/ assistance  Confirmed importance and Date/Time of follow-up visits scheduled: Yes. Dr. Mariea Clonts 11/15/2016 @ 3pm   Confirmed with patient if condition worsens to call PCP or go to the Emergency Dept. Patient was given office number and encouraged to call back with questions or concerns: yes

## 2016-11-14 DIAGNOSIS — N2581 Secondary hyperparathyroidism of renal origin: Secondary | ICD-10-CM | POA: Diagnosis not present

## 2016-11-14 DIAGNOSIS — D509 Iron deficiency anemia, unspecified: Secondary | ICD-10-CM | POA: Diagnosis not present

## 2016-11-14 DIAGNOSIS — E8779 Other fluid overload: Secondary | ICD-10-CM | POA: Diagnosis not present

## 2016-11-14 DIAGNOSIS — N186 End stage renal disease: Secondary | ICD-10-CM | POA: Diagnosis not present

## 2016-11-15 ENCOUNTER — Ambulatory Visit (INDEPENDENT_AMBULATORY_CARE_PROVIDER_SITE_OTHER): Payer: Medicare Other | Admitting: Internal Medicine

## 2016-11-15 ENCOUNTER — Encounter: Payer: Self-pay | Admitting: Internal Medicine

## 2016-11-15 VITALS — BP 168/88 | HR 65 | Temp 98.5°F | Wt 215.0 lb

## 2016-11-15 DIAGNOSIS — E1121 Type 2 diabetes mellitus with diabetic nephropathy: Secondary | ICD-10-CM | POA: Diagnosis not present

## 2016-11-15 DIAGNOSIS — A0472 Enterocolitis due to Clostridium difficile, not specified as recurrent: Secondary | ICD-10-CM | POA: Diagnosis not present

## 2016-11-15 DIAGNOSIS — Z9989 Dependence on other enabling machines and devices: Secondary | ICD-10-CM

## 2016-11-15 DIAGNOSIS — Q8781 Alport syndrome: Secondary | ICD-10-CM | POA: Insufficient documentation

## 2016-11-15 DIAGNOSIS — N186 End stage renal disease: Secondary | ICD-10-CM

## 2016-11-15 DIAGNOSIS — Z599 Problem related to housing and economic circumstances, unspecified: Secondary | ICD-10-CM

## 2016-11-15 DIAGNOSIS — G4733 Obstructive sleep apnea (adult) (pediatric): Secondary | ICD-10-CM

## 2016-11-15 DIAGNOSIS — Z598 Other problems related to housing and economic circumstances: Secondary | ICD-10-CM | POA: Diagnosis not present

## 2016-11-15 DIAGNOSIS — Z992 Dependence on renal dialysis: Secondary | ICD-10-CM

## 2016-11-15 DIAGNOSIS — J9601 Acute respiratory failure with hypoxia: Secondary | ICD-10-CM

## 2016-11-15 MED ORDER — VANCOMYCIN 50 MG/ML ORAL SOLUTION: 125.0000 mg | Freq: Four times a day (QID) | ORAL | 0 refills | Status: DC

## 2016-11-15 NOTE — Progress Notes (Signed)
Location:  [facility] clinic Provider: Abria Vannostrand L. Mariea Clonts, D.O., C.M.D.  Code Status: full code Goals of Care:  Advanced Directives 11/02/2016  Does Patient Have a Medical Advance Directive? No  Would patient like information on creating a medical advance directive? No - Patient declined  Pre-existing out of facility DNR order (yellow form or pink MOST form) -   Chief Complaint  Patient presents with  . Transitions Of Care    11/02/2016 - 11/11/2016 (9 days  . Hospitalization Follow-up    HPI: Patient is a 56 y.o. male seen today for hospital follow-up s/p admission from 10/12 to 11/11/16.  Of note, he was first hospitalized from 10/10 to 11/01/16 at [facility] with acute respiratory failure with hypoxemia, C diff enteritis, obesity hypoventilation syndrome, pulmonary edema, ESRD on dialysis, thrombocytopenia, DMII with renal complications, HTN, and OSA on CPAP.  He had recently been treated with keflex for a UTI and then he saw GI and had C diff PCR positive for which he was prescribed oral vanc for 2 wks which he did not fill until 10/10 due to delays with offices and pharmacies (prescribed 10/5).  He was having 20 episodes of loose stool and then only 5-6 just before the admission plus abdominal distension.  He was then put on vanc 125mg  qid for 2 wks.  He was admitted again 10/12 with increased dyspnea, coughing, abdominal pain, and diarrhea.  He was admitted with acute hypoxic respiratory failure secondary to acute pulmonary edema and fluid overload and for his recurrent c diff enteritis.  He also had a headache that responded to IV toradol, benadryl and reglan in the ED.  He had formed stools before leaving the hospital and was afebrile w/o leukocytosis.  He was followed by PT and d/cd home with home health PT.  He was to remain on the oral vanc for 10 days from discharge.    Still having 3-4 BMs per day.  Was getting better.  He was to begin the liquid vanc instead of the pills.  Still on the  vanc pills which are not working [employer] apparently would not cover the liquid for a reasonable cost.  He's been getting hot and cold.  No abdominal pain, only muscle spasms.    They've been saying he is not getting dialyzed enough.  Had fluid after three days of HD in hospital.  He was getting muscle spasms from getting so dry per his wife.  Weight here is 215 lbs.  Reviewed echo and he did not have systolic or diastolic dysfunction or any significant valvular disease so seems this is purely renal.    Past Medical History:  Diagnosis Date  . Acute edema of lung, unspecified   . Acute, but ill-defined, cerebrovascular disease   . Allergy   . Anemia   . 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)   . Anxiety   . Asthma   . Carpal tunnel syndrome   . Cellulitis and abscess of trunk   . Cholelithiasis 07/13/2014  . Chronic headaches   . Debility, unspecified   . Dermatophytosis of the body   . Dysrhythmia    history of  . Edema   . End stage renal disease on dialysis [facility])    "MWF; [facility]" (10/21/2014)  . Essential hypertension, benign   . GERD (gastroesophageal reflux disease)   . Gout, unspecified   . HTN (hypertension)   . Hypertrophy of prostate without urinary obstruction and other lower urinary tract  symptoms (LUTS)   . Hypotension, unspecified   . Impotence of organic origin   . Insomnia, unspecified   . Kidney replaced by transplant   . Localization-related (focal) (partial) epilepsy and epileptic syndromes with complex partial seizures, without mention of intractable epilepsy   . Lumbago   . Memory loss   . OSA on CPAP   . Other and unspecified hyperlipidemia   . Other chronic nonalcoholic liver disease   . Other malaise and fatigue   . Other nonspecific abnormal serum enzyme levels   . Pain in joint, lower leg   . Pain in joint, upper arm   . Pneumonia "several times"  . Renal dialysis status(V45.11) 02/05/2010   restarted 01/02/13 ofter  renal trransplant failure  . Secondary hyperparathyroidism (of renal origin)   . Shortness of breath   . Sleep apnea   . Tension headache   . Unspecified constipation   . Unspecified essential hypertension   . Unspecified hereditary and idiopathic peripheral neuropathy   . Unspecified vitamin D deficiency     Past Surgical History:  Procedure Laterality Date  . AV FISTULA PLACEMENT Left ?2010   "forearm; at [facility]"  . BACK SURGERY    . CARDIAC CATHETERIZATION  03/21/2011  . CHOLECYSTECTOMY N/A 10/21/2014   Procedure: LAPAROSCOPIC CHOLECYSTECTOMY WITH INTRAOPERATIVE CHOLANGIOGRAM;  Surgeon: Autumn Messing III, MD;  Location: [facility];  Service: General;  Laterality: N/A;  . COLONOSCOPY    . 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 KIDNEY TRANSPLANT  08/17/2011   [place]  10/21/2014   w/IOC  . LEFT HEART CATHETERIZATION WITH CORONARY ANGIOGRAM N/A 03/21/2011   Procedure: LEFT HEART CATHETERIZATION WITH CORONARY ANGIOGRAM;  Surgeon: [name], MD;  Location: [facility] CATH LAB;  Service: Cardiovascular;  Laterality: N/A;  . NEPHRECTOMY  08/2013   removed transplaned kidney  . POSTERIOR FUSION CERVICAL SPINE  06/25/2012   for spinal stenosis  . VASECTOMY  2010    Allergies  Allergen Reactions  . Codeine Nausea And Vomiting and Nausea Only    Altered mental status    Outpatient Encounter Prescriptions as of 11/15/2016  Medication Sig  . acetaminophen (TYLENOL) 500 MG tablet Take 1 tablet (500 mg total) by mouth every 6 (six) hours as needed for moderate pain.  . Amino Acids-Protein Hydrolys (FEEDING SUPPLEMENT, PRO-STAT SUGAR FREE 64,) LIQD Take 30 mLs by mouth 2 (two) times daily.  Marland Kitchen amLODipine (NORVASC) 10 MG tablet One twice daily to control BP  . aspirin EC 81 MG tablet Take 81 mg by mouth daily.  Marland Kitchen b complex-vitamin c-folic acid (NEPHRO-VITE) 0.8 MG TABS tablet TAKE 1 TABLET BY MOUTH EVERY DAY  . budesonide-formoterol  (SYMBICORT) 80-4.5 MCG/ACT inhaler Inhale 2 puffs into the lungs 2 (two) times daily.  . calcitRIOL (ROCALTROL) 0.25 MCG capsule Take 1 capsule (0.25 mcg total) by mouth every Monday, Wednesday, and Friday with hemodialysis.  . Cholecalciferol (VITAMIN D3) 5000 UNITS TABS Take 1 tablet by mouth daily.  . clonazePAM (KLONOPIN) 0.5 MG tablet Take 1 tablet (0.5 mg total) by mouth 2 (two) times daily.  . diphenhydrAMINE (BENADRYL) 25 MG tablet Take 25 mg by mouth at bedtime as needed for allergies.  Marland Kitchen guaiFENesin (MUCINEX) 600 MG 12 hr tablet Take 600 mg by mouth 2 (two) times daily.  Marland Kitchen guaiFENesin-dextromethorphan (ROBITUSSIN DM) 100-10 MG/5ML syrup Take 10 mLs by mouth every 4 (four) hours as needed for cough.  . hydrALAZINE (  APRESOLINE) 50 MG tablet Take 1 tablet (50 mg total) by mouth every 8 (eight) hours.  . metoCLOPramide (REGLAN) 10 MG tablet Take 10 mg by mouth as needed for nausea.  . metoprolol tartrate (LOPRESSOR) 50 MG tablet Take One and a half tablet by mouth twice daily  . omeprazole (PRILOSEC) 20 MG capsule TAKE 1 CAPSULE (20 MG TOTAL) BY MOUTH 2 (TWO) TIMES DAILY BEFORE A MEAL.  Marland Kitchen ondansetron (ZOFRAN ODT) 4 MG disintegrating tablet Take 1 tablet (4 mg total) by mouth every 8 (eight) hours as needed for nausea or vomiting.  . pravastatin (PRAVACHOL) 40 MG tablet Take 1 tablet (40 mg total) by mouth every evening.  . sevelamer carbonate (RENVELA) 800 MG tablet Take 3 tablets (2,400 mg total) by mouth 3 (three) times daily with meals.  . traMADol (ULTRAM) 50 MG tablet Take 1 tablet (50 mg total) by mouth daily as needed (severe headache).  . vancomycin (VANCOCIN) 125 MG capsule Take 1 capsule (125 mg total) by mouth 4 (four) times daily.   No facility-administered encounter medications on file as of 11/15/2016.     Review of Systems:  Review of Systems  Constitutional: Positive for chills, malaise/fatigue and weight loss. Negative for fever.  HENT: Positive for hearing loss.  Negative for congestion.   Eyes: Negative for blurred vision.  Respiratory: Positive for shortness of breath. Negative for cough.   Cardiovascular: Negative for chest pain, palpitations and leg swelling.  Gastrointestinal: Positive for diarrhea. Negative for abdominal pain, blood in stool, constipation, melena, nausea and vomiting.  Genitourinary: Negative for dysuria.  Musculoskeletal: Negative for falls.  Skin: Negative for itching and rash.  Neurological: Positive for weakness. Negative for dizziness and loss of consciousness.  Psychiatric/Behavioral: Negative for memory loss.    Health Maintenance  Topic Date Due  . OPHTHALMOLOGY EXAM  01/29/1972  . FOOT EXAM  04/29/2014  . PNEUMOCOCCAL POLYSACCHARIDE VACCINE (2) 11/01/2015  . TETANUS/TDAP  01/23/2016  . INFLUENZA VACCINE  08/22/2016  . URINE MICROALBUMIN  02/14/2017  . HEMOGLOBIN A1C  05/03/2017  . COLONOSCOPY  08/30/2019  . Hepatitis C Screening  Completed  . HIV Screening  Completed    Physical Exam: Vitals:   11/15/16 1504  BP: (!) 168/88  Pulse: 65  Temp: 98.5 F (36.9 C)  TempSrc: Oral  SpO2: 97%  Weight: 215 lb (97.5 kg)   Body mass index is 33.67 kg/m. Physical Exam  Constitutional: He is oriented to person, place, and time. He appears well-developed. No distress.  c diff odor in room  HENT:  Head: Normocephalic and atraumatic.  HOH  Eyes: Pupils are equal, round, and reactive to light. EOM are normal.  glasses  Cardiovascular: Normal rate, regular rhythm, normal heart sounds and intact distal pulses.   Pulmonary/Chest: Effort normal and breath sounds normal. No respiratory distress.  Abdominal: Soft. He exhibits distension. He exhibits no mass. There is no tenderness. There is no rebound and no guarding.  Hyperactive bowel sounds  Musculoskeletal: Normal range of motion.  Neurological: He is alert and oriented to person, place, and time.  Awake and alert today  Skin: Skin is warm and dry.    Psychiatric:  Flat affect    Labs reviewed: Basic Metabolic Panel:  Recent Labs  09/19/16 0504 09/21/16 0359  11/01/16 0412  11/05/16 0509 11/06/16 0500 11/07/16 0549 11/08/16 0200 11/09/16 0349 11/10/16 0237  NA 134* 131*  < > 138  < > 135 134* 132* 134* 136 133*  K 4.8 4.4  < >  4.5  < > 4.2 4.5 5.0 3.5 3.8 3.6  CL 94* 93*  < > 93*  < > 94* 94* 93* 96* 94* 92*  CO2 21* 20*  < > 30  < > 22 26 21* 24 28 27   GLUCOSE 246* 190*  < > 124*  < > 102* 99 120* 111* 86 107*  BUN 130* 136*  < > 36*  < > 80* 42* 59* 30* 25* 27*  CREATININE 14.14* 13.66*  < > 8.24*  < > 18.16* 13.19* 15.82* 12.37* 11.03* 8.76*  CALCIUM 8.9 8.2*  < > 9.2  < > 7.7* 8.4* 7.9* 8.1* 9.1 9.1  MG  --   --   --   --   < > 2.5* 2.3 2.3  --   --   --   PHOS 6.7* 8.2*  --  4.3  --   --   --   --   --   --   --   < > = values in this interval not displayed. Liver Function Tests:  Recent Labs  11/04/16 0420 11/07/16 0549 11/08/16 0200  AST 26 33 26  ALT 22 37 34  ALKPHOS 79 72 77  BILITOT 1.1 1.3* 1.1  PROT 6.9 7.1 7.0  ALBUMIN 3.3* 3.2* 3.3*    Recent Labs  06/19/16 1800 10/26/16 1705 11/02/16 0801  LIPASE 84* 89* 74*   No results for input(s): AMMONIA in the last 8760 hours. CBC:  Recent Labs  11/08/16 0200 11/09/16 0349 11/10/16 0237  WBC 3.9* 3.9* 4.7  NEUTROABS 2.8 2.3 2.7  HGB 11.2* 11.6* 12.3*  HCT 33.9* 36.6* 37.6*  MCV 92.6 95.3 94.5  PLT 74* 90* 115*   Lipid Panel: No results for input(s): CHOL, HDL, LDLCALC, TRIG, CHOLHDL, LDLDIRECT in the last 8760 hours. Lab Results  Component Value Date   HGBA1C 5.4 11/02/2016    Procedures since last visit: Dg Chest 2 View  Result Date: 11/02/2016 CLINICAL DATA:  Fever, chest pain, weakness EXAM: CHEST  2 VIEW COMPARISON:  10/31/2016 FINDINGS: Heart and mediastinal contours are within normal limits. No focal opacities or effusions. No acute bony abnormality. IMPRESSION: No active cardiopulmonary disease. Electronically Signed   By:  Rolm Baptise M.D.   On: 11/02/2016 08:10    Assessment/Plan 1. Clostridium difficile enteritis -recurrent -will attempt again to get him liquid vanc since this is the recommended therapy for his c diff which has recurred in a tapering dose -counseled on colonization, but clearly still infected based on odor and pt's ongoing 3-4 loose stools per day, chills he's had -educated on hygiene, good handwashing and cleaning with bleach to prevent spread of infection with spores -also on prostat supplement which can contribute to loose stools -is eating per dialysis diet  2. Acute respiratory failure with hypoxemia (HCC) -resolved, seems he is having recurrent episodes of volume overload--would expect this is related to problems determining fluid balance when he was having so much diarrhea -now only 3-4 loose stools per day -breathing improved, lungs clear -wife also says he had pneumonia and was given more abx at the hospital which certainly did not help the c diff situation but would have been necessary if he truly had another infection  3. OSA on CPAP -cont faithful cpap -avoid benadryl which is sedating and seems to accumulate and sedate him  4. ESRD on dialysis ([place]) -cont tiw HD -needing adjustment due to diarrhea  5. Financial difficulties -given information about medicare's extra help  program, but I'm not certain he'll qualify b/c he's made too much money for medicaid--wife and daughter were going to look into it  6. Type 2 diabetes mellitus with diabetic nephropathy, without long-term current use of insulin (HCC) -not on meds for this at this point, cont dietary changes and monitor  7. 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  Next appt:  03/18/2017 med mgt  Counseled to call  here with problems if he's not acutely ill before proceeding to the ED to see if we can manage any of his problems outpatient.  Lillee Mooneyhan L. Terie Lear, D.O. [facility] [address], [place] [address] Cell Phone (Mon-Fri 8am-5pm):  [phone] On Call:  [phone] & follow prompts after 5pm & weekends Office Phone:  [phone] Office Fax:  [phone]

## 2016-11-16 DIAGNOSIS — N2581 Secondary hyperparathyroidism of renal origin: Secondary | ICD-10-CM | POA: Diagnosis not present

## 2016-11-16 DIAGNOSIS — N186 End stage renal disease: Secondary | ICD-10-CM | POA: Diagnosis not present

## 2016-11-16 DIAGNOSIS — D509 Iron deficiency anemia, unspecified: Secondary | ICD-10-CM | POA: Diagnosis not present

## 2016-11-16 DIAGNOSIS — E8779 Other fluid overload: Secondary | ICD-10-CM | POA: Diagnosis not present

## 2016-11-19 DIAGNOSIS — D509 Iron deficiency anemia, unspecified: Secondary | ICD-10-CM | POA: Diagnosis not present

## 2016-11-19 DIAGNOSIS — N186 End stage renal disease: Secondary | ICD-10-CM | POA: Diagnosis not present

## 2016-11-19 DIAGNOSIS — N2581 Secondary hyperparathyroidism of renal origin: Secondary | ICD-10-CM | POA: Diagnosis not present

## 2016-11-19 DIAGNOSIS — E8779 Other fluid overload: Secondary | ICD-10-CM | POA: Diagnosis not present

## 2016-11-20 DIAGNOSIS — N186 End stage renal disease: Secondary | ICD-10-CM | POA: Diagnosis not present

## 2016-11-20 DIAGNOSIS — N2581 Secondary hyperparathyroidism of renal origin: Secondary | ICD-10-CM | POA: Diagnosis not present

## 2016-11-20 DIAGNOSIS — D509 Iron deficiency anemia, unspecified: Secondary | ICD-10-CM | POA: Diagnosis not present

## 2016-11-20 DIAGNOSIS — E8779 Other fluid overload: Secondary | ICD-10-CM | POA: Diagnosis not present

## 2016-11-21 DIAGNOSIS — Z992 Dependence on renal dialysis: Secondary | ICD-10-CM | POA: Diagnosis not present

## 2016-11-21 DIAGNOSIS — D509 Iron deficiency anemia, unspecified: Secondary | ICD-10-CM | POA: Diagnosis not present

## 2016-11-21 DIAGNOSIS — N186 End stage renal disease: Secondary | ICD-10-CM | POA: Diagnosis not present

## 2016-11-21 DIAGNOSIS — E8779 Other fluid overload: Secondary | ICD-10-CM | POA: Diagnosis not present

## 2016-11-21 DIAGNOSIS — N2581 Secondary hyperparathyroidism of renal origin: Secondary | ICD-10-CM | POA: Diagnosis not present

## 2016-11-21 DIAGNOSIS — T8612 Kidney transplant failure: Secondary | ICD-10-CM | POA: Diagnosis not present

## 2016-11-23 ENCOUNTER — Ambulatory Visit (INDEPENDENT_AMBULATORY_CARE_PROVIDER_SITE_OTHER): Payer: Medicare Other | Admitting: Gastroenterology

## 2016-11-23 ENCOUNTER — Other Ambulatory Visit: Payer: Medicare Other

## 2016-11-23 ENCOUNTER — Encounter: Payer: Self-pay | Admitting: Gastroenterology

## 2016-11-23 VITALS — BP 110/60 | HR 88 | Ht 67.0 in | Wt 211.2 lb

## 2016-11-23 DIAGNOSIS — D631 Anemia in chronic kidney disease: Secondary | ICD-10-CM | POA: Diagnosis not present

## 2016-11-23 DIAGNOSIS — R197 Diarrhea, unspecified: Secondary | ICD-10-CM

## 2016-11-23 DIAGNOSIS — A09 Infectious gastroenteritis and colitis, unspecified: Secondary | ICD-10-CM

## 2016-11-23 DIAGNOSIS — E8779 Other fluid overload: Secondary | ICD-10-CM | POA: Diagnosis not present

## 2016-11-23 DIAGNOSIS — N2581 Secondary hyperparathyroidism of renal origin: Secondary | ICD-10-CM | POA: Diagnosis not present

## 2016-11-23 DIAGNOSIS — N186 End stage renal disease: Secondary | ICD-10-CM | POA: Diagnosis not present

## 2016-11-23 MED ORDER — FIDAXOMICIN 200 MG PO TABS: 200.0000 mg | ORAL_TABLET | Freq: Two times a day (BID) | ORAL | 0 refills | Status: AC

## 2016-11-23 NOTE — Progress Notes (Signed)
HPI: This is a very pleasant very pleasant 56 year old man whom I last saw him in his last month.  He is here with his wife today  He is still having multiple watery bowel movements every day.  Pretty much after any food or water intake.  He has a history of adenomatous polyps, I did a colonoscopy for him 2-1/2 months ago and found more adenomas.  I recommended repeat colonoscopy at 3-year interval.  About a month ago he was found to have C. difficile diarrhea by antigen positive and PCR positive stool testing  Having loose stools with any intake of   He is on pill vancomycin 125mg  qid.  He has been on this for 4 weeks.  Chief complaint is continued watery diarrhea  ROS: complete GI ROS as described in HPI, all other review negative.  Constitutional:  No unintentional weight loss   Past Medical History:  Diagnosis Date  . Acute edema of lung, unspecified   . Acute, but ill-defined, cerebrovascular disease   . Allergy   . Anemia   . 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)   . Anxiety   . Asthma   . Carpal tunnel syndrome   . Cellulitis and abscess of trunk   . Cholelithiasis 07/13/2014  . Chronic headaches   . Debility, unspecified   . Dermatophytosis of the body   . Dysrhythmia    history of  . Edema   . End stage renal disease on dialysis [facility])    "MWF; [facility] in [facility]" (10/21/2014)  . Essential hypertension, benign   . GERD (gastroesophageal reflux disease)   . Gout, unspecified   . HTN (hypertension)   . Hypertrophy of prostate without urinary obstruction and other lower urinary tract symptoms (LUTS)   . Hypotension, unspecified   . Impotence of organic origin   . Insomnia, unspecified   . Kidney replaced by transplant   . Localization-related (focal) (partial) epilepsy and epileptic syndromes with complex partial seizures, without mention of intractable epilepsy   . Lumbago   . Memory loss   . OSA on CPAP   . Other and unspecified  hyperlipidemia   . Other chronic nonalcoholic liver disease   . Other malaise and fatigue   . Other nonspecific abnormal serum enzyme levels   . Pain in joint, lower leg   . Pain in joint, upper arm   . Pneumonia "several times"  . Renal dialysis status(V45.11) 02/05/2010   restarted 01/02/13 ofter renal trransplant failure  . Secondary hyperparathyroidism (of renal origin)   . Shortness of breath   . Sleep apnea   . Tension headache   . Unspecified constipation   . Unspecified essential hypertension   . Unspecified hereditary and idiopathic peripheral neuropathy   . Unspecified vitamin D deficiency     Past Surgical History:  Procedure Laterality Date  . AV FISTULA PLACEMENT Left ?2010   "forearm; at [facility]"  . BACK SURGERY    . CARDIAC CATHETERIZATION  03/21/2011  . CHOLECYSTECTOMY N/A 10/21/2014   Procedure: LAPAROSCOPIC CHOLECYSTECTOMY WITH INTRAOPERATIVE CHOLANGIOGRAM;  Surgeon: Autumn Messing III, MD;  Location: [facility];  Service: General;  Laterality: N/A;  . COLONOSCOPY    . 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 KIDNEY TRANSPLANT  08/17/2011   [place]  10/21/2014   w/IOC  . LEFT HEART CATHETERIZATION WITH CORONARY ANGIOGRAM N/A 03/21/2011   Procedure: LEFT HEART CATHETERIZATION WITH CORONARY ANGIOGRAM;  Surgeon:  [name], MD;  Location: [facility] CATH LAB;  Service: Cardiovascular;  Laterality: N/A;  . NEPHRECTOMY  08/2013   removed transplaned kidney  . POSTERIOR FUSION CERVICAL SPINE  06/25/2012   for spinal stenosis  . VASECTOMY  2010    Current Outpatient Prescriptions  Medication Sig Dispense Refill  . acetaminophen (TYLENOL) 500 MG tablet Take 1 tablet (500 mg total) by mouth every 6 (six) hours as needed for moderate pain.    Marland Kitchen aspirin EC 81 MG tablet Take 81 mg by mouth daily.    Marland Kitchen b complex-vitamin c-folic acid (NEPHRO-VITE) 0.8 MG TABS tablet TAKE 1 TABLET BY MOUTH EVERY DAY 90 tablet 1  .  budesonide-formoterol (SYMBICORT) 80-4.5 MCG/ACT inhaler Inhale 2 puffs into the lungs 2 (two) times daily. 1 Inhaler 1  . calcitRIOL (ROCALTROL) 0.25 MCG capsule Take 1 capsule (0.25 mcg total) by mouth every Monday, Wednesday, and Friday with hemodialysis. 30 capsule 1  . Cholecalciferol (VITAMIN D3) 5000 UNITS TABS Take 1 tablet by mouth daily.    . clonazePAM (KLONOPIN) 0.5 MG tablet Take 1 tablet (0.5 mg total) by mouth 2 (two) times daily. (Patient taking differently: Take 0.5 mg by mouth 2 (two) times daily as needed. ) 30 tablet 0  . diphenhydrAMINE (BENADRYL) 25 MG tablet Take 25 mg by mouth at bedtime as needed for allergies.    Marland Kitchen guaiFENesin (MUCINEX) 600 MG 12 hr tablet Take 600 mg by mouth 2 (two) times daily.    Marland Kitchen guaiFENesin-dextromethorphan (ROBITUSSIN DM) 100-10 MG/5ML syrup Take 10 mLs by mouth every 4 (four) hours as needed for cough.    . metoprolol tartrate (LOPRESSOR) 50 MG tablet Take One and a half tablet by mouth twice daily 270 tablet 3  . omeprazole (PRILOSEC) 20 MG capsule TAKE 1 CAPSULE (20 MG TOTAL) BY MOUTH 2 (TWO) TIMES DAILY BEFORE A MEAL. 60 capsule 0  . ondansetron (ZOFRAN ODT) 4 MG disintegrating tablet Take 1 tablet (4 mg total) by mouth every 8 (eight) hours as needed for nausea or vomiting. 20 tablet 0  . pravastatin (PRAVACHOL) 40 MG tablet Take 1 tablet (40 mg total) by mouth every evening. 90 tablet 1  . sevelamer carbonate (RENVELA) 800 MG tablet Take 3 tablets (2,400 mg total) by mouth 3 (three) times daily with meals. 270 tablet 0  . traMADol (ULTRAM) 50 MG tablet Take 1 tablet (50 mg total) by mouth daily as needed (severe headache). 30 tablet 0  . vancomycin (VANCOCIN) 50 mg/mL oral solution Take 2.5 mLs (125 mg total) by mouth 4 (four) times daily. 25 mL 0  . amLODipine (NORVASC) 10 MG tablet One twice daily to control BP (Patient not taking: Reported on 11/23/2016) 180 tablet 3  . hydrALAZINE (APRESOLINE) 50 MG tablet Take 1 tablet (50 mg total) by mouth  every 8 (eight) hours. (Patient not taking: Reported on 11/23/2016) 90 tablet 1   No current facility-administered medications for this visit.     Allergies as of 11/23/2016 - Review Complete 11/23/2016  Allergen Reaction Noted  . Codeine Nausea And Vomiting and Nausea Only 05/20/2007    Family History  Problem Relation Age of Onset  . Adopted: Yes  . Colon cancer Neg Hx   . Esophageal cancer Neg Hx   . Rectal cancer Neg Hx   . Stomach cancer Neg Hx     Social History   Social History  . Marital status: Married    Spouse name: [name]  . Number of children: 3  .  Years of education: N/A   Occupational History  . disabled Disabled   Social History Main Topics  . Smoking status: Current Every Day Smoker    Packs/day: 0.50    Years: 32.00    Types: E-cigarettes    Last attempt to quit: 08/23/2014  . Smokeless tobacco: Never Used  . Alcohol use No  . Drug use: No  . Sexual activity: Yes   Other Topics Concern  . Not on file   Social History Narrative   Drinks 1 cup of caffeine daily.     Physical Exam: BP 110/60 (BP Location: Right Arm, Patient Position: Sitting, Cuff Size: Normal)   Pulse 88   Ht 5\' 7"  (1.702 m)   Wt 211 lb 4 oz (95.8 kg)   BMI 33.09 kg/m  Constitutional: generally well-appearing Psychiatric: alert and oriented x3 Abdomen: soft, nontender, nondistended, no obvious ascites, no peritoneal signs, normal bowel sounds No peripheral edema noted in lower extremities  Assessment and plan: 56 y.o. male with persistent watery diarrhea  He was Clostridium difficile positive by antigen and by PCR.  He has been on about 4 weeks of vancomycin without really much improvement at all.  I am going to recheck his stool today with a GI pathogen panel and Clostridium difficile by PCR and by toxin assay.  I am changing him from vancomycin to fidaxomicin 200 mg twice daily.  He will also start Imodium 1 pill twice daily.  If he is still having severe watery diarrhea  then I think he will need repeat colonoscopy in the very near future.  Please see the "Patient Instructions" section for addition details about the plan.  Owens Loffler, MD [facility] 11/23/2016, 2:52 PM

## 2016-11-23 NOTE — Addendum Note (Signed)
Addended by: Sallye Ober on: 11/23/2016 04:45 PM   Modules accepted: Orders

## 2016-11-23 NOTE — Patient Instructions (Addendum)
Start imodium pill, every morning after waking.  Take another in afternoon if you are not constipated.  You will have labs checked today in the basement lab.  Please head down after you check out with the front desk  (GI pathogen panel, stool for C. Diff by PCR, stool for C. Diff by toxin assay).  Stop vancomycin.  Start dificid 200mg  pill, one pill twice daily for 10 days. Please call [phone] and ask for Patty if the cost is to expensive for the Dificid.    Normal BMI (Body Mass Index- based on height and weight) is between 19 and 25. Your BMI today is Body mass index is 33.09 kg/m. Marland Kitchen Please consider follow up  regarding your BMI with your Primary Care Provider.

## 2016-11-24 LAB — CLOSTRIDIUM DIFFICILE BY PCR: Toxigenic C. Difficile by PCR: NOT DETECTED

## 2016-11-25 LAB — CLOSTRIDIUM DIFFICILE EIA: C DIFFICILE TOXINS A+ B, EIA: NEGATIVE

## 2016-11-26 DIAGNOSIS — N186 End stage renal disease: Secondary | ICD-10-CM | POA: Diagnosis not present

## 2016-11-26 DIAGNOSIS — D631 Anemia in chronic kidney disease: Secondary | ICD-10-CM | POA: Diagnosis not present

## 2016-11-26 DIAGNOSIS — N2581 Secondary hyperparathyroidism of renal origin: Secondary | ICD-10-CM | POA: Diagnosis not present

## 2016-11-26 DIAGNOSIS — E8779 Other fluid overload: Secondary | ICD-10-CM | POA: Diagnosis not present

## 2016-11-26 LAB — GASTROINTESTINAL PATHOGEN PANEL PCR
C. DIFFICILE TOX A/B, PCR: NOT DETECTED
CAMPYLOBACTER, PCR: NOT DETECTED
CRYPTOSPORIDIUM, PCR: NOT DETECTED
E COLI (STEC) STX1/STX2, PCR: NOT DETECTED
E coli (ETEC) LT/ST PCR: NOT DETECTED
E coli 0157, PCR: NOT DETECTED
GIARDIA LAMBLIA, PCR: NOT DETECTED
Norovirus, PCR: NOT DETECTED
ROTAVIRUS, PCR: NOT DETECTED
Salmonella, PCR: NOT DETECTED
Shigella, PCR: NOT DETECTED

## 2016-11-27 DIAGNOSIS — D631 Anemia in chronic kidney disease: Secondary | ICD-10-CM | POA: Diagnosis not present

## 2016-11-27 DIAGNOSIS — N186 End stage renal disease: Secondary | ICD-10-CM | POA: Diagnosis not present

## 2016-11-27 DIAGNOSIS — N2581 Secondary hyperparathyroidism of renal origin: Secondary | ICD-10-CM | POA: Diagnosis not present

## 2016-11-27 DIAGNOSIS — E8779 Other fluid overload: Secondary | ICD-10-CM | POA: Diagnosis not present

## 2016-11-28 DIAGNOSIS — E8779 Other fluid overload: Secondary | ICD-10-CM | POA: Diagnosis not present

## 2016-11-28 DIAGNOSIS — D631 Anemia in chronic kidney disease: Secondary | ICD-10-CM | POA: Diagnosis not present

## 2016-11-28 DIAGNOSIS — N2581 Secondary hyperparathyroidism of renal origin: Secondary | ICD-10-CM | POA: Diagnosis not present

## 2016-11-28 DIAGNOSIS — N186 End stage renal disease: Secondary | ICD-10-CM | POA: Diagnosis not present

## 2016-11-29 ENCOUNTER — Other Ambulatory Visit: Payer: Self-pay | Admitting: Gastroenterology

## 2016-11-30 DIAGNOSIS — D631 Anemia in chronic kidney disease: Secondary | ICD-10-CM | POA: Diagnosis not present

## 2016-11-30 DIAGNOSIS — E8779 Other fluid overload: Secondary | ICD-10-CM | POA: Diagnosis not present

## 2016-11-30 DIAGNOSIS — N2581 Secondary hyperparathyroidism of renal origin: Secondary | ICD-10-CM | POA: Diagnosis not present

## 2016-11-30 DIAGNOSIS — N186 End stage renal disease: Secondary | ICD-10-CM | POA: Diagnosis not present

## 2016-12-01 ENCOUNTER — Other Ambulatory Visit: Payer: Self-pay | Admitting: Internal Medicine

## 2016-12-01 DIAGNOSIS — N186 End stage renal disease: Secondary | ICD-10-CM

## 2016-12-01 DIAGNOSIS — Z992 Dependence on renal dialysis: Principal | ICD-10-CM

## 2016-12-03 ENCOUNTER — Telehealth: Payer: Self-pay | Admitting: Gastroenterology

## 2016-12-03 DIAGNOSIS — D631 Anemia in chronic kidney disease: Secondary | ICD-10-CM | POA: Diagnosis not present

## 2016-12-03 DIAGNOSIS — E8779 Other fluid overload: Secondary | ICD-10-CM | POA: Diagnosis not present

## 2016-12-03 DIAGNOSIS — N2581 Secondary hyperparathyroidism of renal origin: Secondary | ICD-10-CM | POA: Diagnosis not present

## 2016-12-03 DIAGNOSIS — N186 End stage renal disease: Secondary | ICD-10-CM | POA: Diagnosis not present

## 2016-12-03 NOTE — Telephone Encounter (Signed)
I'm glad the imodium helps, tell him he absolutely needs to stay on it as recommended (and complete the dificid).  rov with extender or me in 4-5 weeks.

## 2016-12-03 NOTE — Telephone Encounter (Signed)
The pt has been notified and ROV scheduled with Nevin Bloodgood.  The pt will take imodium and will complete dificid.

## 2016-12-03 NOTE — Telephone Encounter (Signed)
The pt states that imodium works very well when he takes it, however,  the wife does not want him taking imodium regularly. She wants to know what else he can do and find a cause for the diarrhea.  Please advise

## 2016-12-05 DIAGNOSIS — E8779 Other fluid overload: Secondary | ICD-10-CM | POA: Diagnosis not present

## 2016-12-05 DIAGNOSIS — N186 End stage renal disease: Secondary | ICD-10-CM | POA: Diagnosis not present

## 2016-12-05 DIAGNOSIS — N2581 Secondary hyperparathyroidism of renal origin: Secondary | ICD-10-CM | POA: Diagnosis not present

## 2016-12-05 DIAGNOSIS — D631 Anemia in chronic kidney disease: Secondary | ICD-10-CM | POA: Diagnosis not present

## 2016-12-07 DIAGNOSIS — E8779 Other fluid overload: Secondary | ICD-10-CM | POA: Diagnosis not present

## 2016-12-07 DIAGNOSIS — N186 End stage renal disease: Secondary | ICD-10-CM | POA: Diagnosis not present

## 2016-12-07 DIAGNOSIS — N2581 Secondary hyperparathyroidism of renal origin: Secondary | ICD-10-CM | POA: Diagnosis not present

## 2016-12-07 DIAGNOSIS — D631 Anemia in chronic kidney disease: Secondary | ICD-10-CM | POA: Diagnosis not present

## 2016-12-09 DIAGNOSIS — E8779 Other fluid overload: Secondary | ICD-10-CM | POA: Diagnosis not present

## 2016-12-09 DIAGNOSIS — D631 Anemia in chronic kidney disease: Secondary | ICD-10-CM | POA: Diagnosis not present

## 2016-12-09 DIAGNOSIS — N2581 Secondary hyperparathyroidism of renal origin: Secondary | ICD-10-CM | POA: Diagnosis not present

## 2016-12-09 DIAGNOSIS — N186 End stage renal disease: Secondary | ICD-10-CM | POA: Diagnosis not present

## 2016-12-11 DIAGNOSIS — N2581 Secondary hyperparathyroidism of renal origin: Secondary | ICD-10-CM | POA: Diagnosis not present

## 2016-12-11 DIAGNOSIS — E8779 Other fluid overload: Secondary | ICD-10-CM | POA: Diagnosis not present

## 2016-12-11 DIAGNOSIS — D631 Anemia in chronic kidney disease: Secondary | ICD-10-CM | POA: Diagnosis not present

## 2016-12-11 DIAGNOSIS — N186 End stage renal disease: Secondary | ICD-10-CM | POA: Diagnosis not present

## 2016-12-14 DIAGNOSIS — N186 End stage renal disease: Secondary | ICD-10-CM | POA: Diagnosis not present

## 2016-12-14 DIAGNOSIS — E8779 Other fluid overload: Secondary | ICD-10-CM | POA: Diagnosis not present

## 2016-12-14 DIAGNOSIS — N2581 Secondary hyperparathyroidism of renal origin: Secondary | ICD-10-CM | POA: Diagnosis not present

## 2016-12-14 DIAGNOSIS — D631 Anemia in chronic kidney disease: Secondary | ICD-10-CM | POA: Diagnosis not present

## 2016-12-17 DIAGNOSIS — E8779 Other fluid overload: Secondary | ICD-10-CM | POA: Diagnosis not present

## 2016-12-17 DIAGNOSIS — D631 Anemia in chronic kidney disease: Secondary | ICD-10-CM | POA: Diagnosis not present

## 2016-12-17 DIAGNOSIS — N2581 Secondary hyperparathyroidism of renal origin: Secondary | ICD-10-CM | POA: Diagnosis not present

## 2016-12-17 DIAGNOSIS — N186 End stage renal disease: Secondary | ICD-10-CM | POA: Diagnosis not present

## 2016-12-19 DIAGNOSIS — D631 Anemia in chronic kidney disease: Secondary | ICD-10-CM | POA: Diagnosis not present

## 2016-12-19 DIAGNOSIS — N186 End stage renal disease: Secondary | ICD-10-CM | POA: Diagnosis not present

## 2016-12-19 DIAGNOSIS — N2581 Secondary hyperparathyroidism of renal origin: Secondary | ICD-10-CM | POA: Diagnosis not present

## 2016-12-19 DIAGNOSIS — E8779 Other fluid overload: Secondary | ICD-10-CM | POA: Diagnosis not present

## 2016-12-21 DIAGNOSIS — Z992 Dependence on renal dialysis: Secondary | ICD-10-CM | POA: Diagnosis not present

## 2016-12-21 DIAGNOSIS — D631 Anemia in chronic kidney disease: Secondary | ICD-10-CM | POA: Diagnosis not present

## 2016-12-21 DIAGNOSIS — N2581 Secondary hyperparathyroidism of renal origin: Secondary | ICD-10-CM | POA: Diagnosis not present

## 2016-12-21 DIAGNOSIS — E8779 Other fluid overload: Secondary | ICD-10-CM | POA: Diagnosis not present

## 2016-12-21 DIAGNOSIS — T8612 Kidney transplant failure: Secondary | ICD-10-CM | POA: Diagnosis not present

## 2016-12-21 DIAGNOSIS — N186 End stage renal disease: Secondary | ICD-10-CM | POA: Diagnosis not present

## 2016-12-24 DIAGNOSIS — D631 Anemia in chronic kidney disease: Secondary | ICD-10-CM | POA: Diagnosis not present

## 2016-12-24 DIAGNOSIS — N2581 Secondary hyperparathyroidism of renal origin: Secondary | ICD-10-CM | POA: Diagnosis not present

## 2016-12-24 DIAGNOSIS — N186 End stage renal disease: Secondary | ICD-10-CM | POA: Diagnosis not present

## 2016-12-26 ENCOUNTER — Other Ambulatory Visit: Payer: Self-pay

## 2016-12-26 DIAGNOSIS — N2581 Secondary hyperparathyroidism of renal origin: Secondary | ICD-10-CM | POA: Diagnosis not present

## 2016-12-26 DIAGNOSIS — D631 Anemia in chronic kidney disease: Secondary | ICD-10-CM | POA: Diagnosis not present

## 2016-12-26 DIAGNOSIS — N186 End stage renal disease: Secondary | ICD-10-CM | POA: Diagnosis not present

## 2016-12-26 MED ORDER — OMEPRAZOLE 20 MG PO CPDR: DELAYED_RELEASE_CAPSULE | ORAL | 3 refills | Status: DC

## 2016-12-28 ENCOUNTER — Other Ambulatory Visit: Payer: Self-pay | Admitting: Gastroenterology

## 2016-12-28 DIAGNOSIS — D631 Anemia in chronic kidney disease: Secondary | ICD-10-CM | POA: Diagnosis not present

## 2016-12-28 DIAGNOSIS — N186 End stage renal disease: Secondary | ICD-10-CM | POA: Diagnosis not present

## 2016-12-28 DIAGNOSIS — N2581 Secondary hyperparathyroidism of renal origin: Secondary | ICD-10-CM | POA: Diagnosis not present

## 2017-01-01 DIAGNOSIS — N186 End stage renal disease: Secondary | ICD-10-CM | POA: Diagnosis not present

## 2017-01-01 DIAGNOSIS — D631 Anemia in chronic kidney disease: Secondary | ICD-10-CM | POA: Diagnosis not present

## 2017-01-01 DIAGNOSIS — N2581 Secondary hyperparathyroidism of renal origin: Secondary | ICD-10-CM | POA: Diagnosis not present

## 2017-01-02 DIAGNOSIS — D631 Anemia in chronic kidney disease: Secondary | ICD-10-CM | POA: Diagnosis not present

## 2017-01-02 DIAGNOSIS — N2581 Secondary hyperparathyroidism of renal origin: Secondary | ICD-10-CM | POA: Diagnosis not present

## 2017-01-02 DIAGNOSIS — N186 End stage renal disease: Secondary | ICD-10-CM | POA: Diagnosis not present

## 2017-01-03 ENCOUNTER — Ambulatory Visit (INDEPENDENT_AMBULATORY_CARE_PROVIDER_SITE_OTHER): Payer: Medicare Other | Admitting: Nurse Practitioner

## 2017-01-03 ENCOUNTER — Other Ambulatory Visit: Payer: Self-pay | Admitting: Internal Medicine

## 2017-01-03 ENCOUNTER — Other Ambulatory Visit: Payer: Medicare Other

## 2017-01-03 ENCOUNTER — Encounter: Payer: Self-pay | Admitting: Nurse Practitioner

## 2017-01-03 VITALS — BP 172/80 | HR 72 | Ht 67.0 in | Wt 226.4 lb

## 2017-01-03 DIAGNOSIS — E785 Hyperlipidemia, unspecified: Secondary | ICD-10-CM

## 2017-01-03 DIAGNOSIS — R197 Diarrhea, unspecified: Secondary | ICD-10-CM

## 2017-01-03 DIAGNOSIS — I10 Essential (primary) hypertension: Secondary | ICD-10-CM

## 2017-01-03 NOTE — Patient Instructions (Signed)
If you are age 55 or older, your body mass index should be between 23-30. Your Body mass index is 35.46 kg/m. If this is out of the aforementioned range listed, please consider follow up with your Primary Care Provider.  If you are age 79 or younger, your body mass index should be between 19-25. Your Body mass index is 35.46 kg/m. If this is out of the aformentioned range listed, please consider follow up with your Primary Care Provider.   Your physician has requested that you go to the basement for the following lab work before leaving today: C Diff  Will call you with lab results.  Thank you for choosing me and [facility].   Tye Savoy, NP

## 2017-01-03 NOTE — Progress Notes (Signed)
Chief Complaint:  Diarrhea   HPI: Patient is a  56 year old male known to Dr. Ardis Hughs. He is s/p kidney transplant several years ago followed by removal of the kidney a couple of years ago. He is on hemodialysis . He has a history of adenomatous colon polyps . 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 He had C. difficile at [facility] in 2013 and 2014. He had it again in [place] in 2015 and again Sept 2018. For this last occurrence patient was treated with Vanco.  We saw him 11/2 for persistent sx. Stool collected for  c-diff (resulted negative) and Vanco changed to Dificid. He completed 7 days of Dificid three weeks ago and with that and BID imodium the diarrhea improved .  He began having a solid bowel movement every few days. Wife was concerned about inducing constipation and also about "masking" something so she reduced imodium to once daily . Now he is having solid stools alternating with watery stools.  Typically has loose stools 2-3 days out of the week,  often 5-6 times a day. It has apparently been difficult to manage amount of fluid taken off at dialysis due to the diarrhea.  No fevers. No abdominal pain. He is having frequent nausea without vomiting.   Past Medical History:  Diagnosis Date  . Acute edema of lung, unspecified   . Acute, but ill-defined, cerebrovascular disease   . Allergy   . Anemia   . 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)   . Anxiety   . Asthma   . Carpal tunnel syndrome   . Cellulitis and abscess of trunk   . Cholelithiasis 07/13/2014  . Chronic headaches   . Debility, unspecified   . Dermatophytosis of the body   . Dysrhythmia    history of  . Edema   . End stage renal disease on dialysis [facility])    "MWF; [facility]" (10/21/2014)  . Essential hypertension, benign   . GERD (gastroesophageal reflux disease)   . Gout, unspecified   . HTN (hypertension)   . Hypertrophy of prostate without urinary obstruction and  other lower urinary tract symptoms (LUTS)   . Hypotension, unspecified   . Impotence of organic origin   . Insomnia, unspecified   . Kidney replaced by transplant   . Localization-related (focal) (partial) epilepsy and epileptic syndromes with complex partial seizures, without mention of intractable epilepsy   . Lumbago   . Memory loss   . OSA on CPAP   . Other and unspecified hyperlipidemia   . Other chronic nonalcoholic liver disease   . Other malaise and fatigue   . Other nonspecific abnormal serum enzyme levels   . Pain in joint, lower leg   . Pain in joint, upper arm   . Pneumonia "several times"  . Renal dialysis status(V45.11) 02/05/2010   restarted 01/02/13 ofter renal trransplant failure  . Secondary hyperparathyroidism (of renal origin)   . Shortness of breath   . Sleep apnea   . Tension headache   . Unspecified constipation   . Unspecified essential hypertension   . Unspecified hereditary and idiopathic peripheral neuropathy   . Unspecified vitamin D deficiency     Patient's surgical history, family medical history, social history, medications and allergies were all reviewed in Epic    Physical Exam: BP (!) 172/80   Pulse 72   Ht 5\' 7"  (1.702 m)   Wt 226 lb 6 oz (102.7 kg)   BMI 35.46  kg/m   GENERAL:  Well developed black male in NAD PSYCH: :Pleasant, cooperative, normal affect EENT:  conjunctiva pink, mucous membranes moist, neck supple without masses CARDIAC:  RRR, trace BLE edema PULM: Normal respiratory effort, lungs CTA bilaterally, no wheezing ABDOMEN:  Nondistended, soft, nontender. 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 Still having alternating constipation and diarrhea on one imodium a day. Actually stools were nearly normal on 2 imodium daily.  I understand wife's concerns about masking something with the imodium but had a colonoscopy in August so IBD, colon neoplasm and other pathology has been ruled out. Recent GI pathogen panel also negative.  -Will recheck c-diff today. After submission of specimen he should take imodium until we notify of results. Would take it BID    Tye Savoy , NP 01/03/2017, 1:51 PM

## 2017-01-04 DIAGNOSIS — N2581 Secondary hyperparathyroidism of renal origin: Secondary | ICD-10-CM | POA: Diagnosis not present

## 2017-01-04 DIAGNOSIS — D631 Anemia in chronic kidney disease: Secondary | ICD-10-CM | POA: Diagnosis not present

## 2017-01-04 DIAGNOSIS — N186 End stage renal disease: Secondary | ICD-10-CM | POA: Diagnosis not present

## 2017-01-06 ENCOUNTER — Encounter: Payer: Self-pay | Admitting: Nurse Practitioner

## 2017-01-07 DIAGNOSIS — N2581 Secondary hyperparathyroidism of renal origin: Secondary | ICD-10-CM | POA: Diagnosis not present

## 2017-01-07 DIAGNOSIS — N186 End stage renal disease: Secondary | ICD-10-CM | POA: Diagnosis not present

## 2017-01-07 DIAGNOSIS — D631 Anemia in chronic kidney disease: Secondary | ICD-10-CM | POA: Diagnosis not present

## 2017-01-07 NOTE — Progress Notes (Signed)
I agree with the above note, plan 

## 2017-01-09 DIAGNOSIS — D631 Anemia in chronic kidney disease: Secondary | ICD-10-CM | POA: Diagnosis not present

## 2017-01-09 DIAGNOSIS — N186 End stage renal disease: Secondary | ICD-10-CM | POA: Diagnosis not present

## 2017-01-09 DIAGNOSIS — N2581 Secondary hyperparathyroidism of renal origin: Secondary | ICD-10-CM | POA: Diagnosis not present

## 2017-01-11 DIAGNOSIS — N186 End stage renal disease: Secondary | ICD-10-CM | POA: Diagnosis not present

## 2017-01-11 DIAGNOSIS — N2581 Secondary hyperparathyroidism of renal origin: Secondary | ICD-10-CM | POA: Diagnosis not present

## 2017-01-11 DIAGNOSIS — D631 Anemia in chronic kidney disease: Secondary | ICD-10-CM | POA: Diagnosis not present

## 2017-01-13 DIAGNOSIS — N2581 Secondary hyperparathyroidism of renal origin: Secondary | ICD-10-CM | POA: Diagnosis not present

## 2017-01-13 DIAGNOSIS — N186 End stage renal disease: Secondary | ICD-10-CM | POA: Diagnosis not present

## 2017-01-13 DIAGNOSIS — D631 Anemia in chronic kidney disease: Secondary | ICD-10-CM | POA: Diagnosis not present

## 2017-01-16 DIAGNOSIS — N186 End stage renal disease: Secondary | ICD-10-CM | POA: Diagnosis not present

## 2017-01-16 DIAGNOSIS — N2581 Secondary hyperparathyroidism of renal origin: Secondary | ICD-10-CM | POA: Diagnosis not present

## 2017-01-16 DIAGNOSIS — D631 Anemia in chronic kidney disease: Secondary | ICD-10-CM | POA: Diagnosis not present

## 2017-01-17 ENCOUNTER — Other Ambulatory Visit: Payer: Self-pay | Admitting: Gastroenterology

## 2017-01-18 DIAGNOSIS — N186 End stage renal disease: Secondary | ICD-10-CM | POA: Diagnosis not present

## 2017-01-18 DIAGNOSIS — N2581 Secondary hyperparathyroidism of renal origin: Secondary | ICD-10-CM | POA: Diagnosis not present

## 2017-01-18 DIAGNOSIS — D631 Anemia in chronic kidney disease: Secondary | ICD-10-CM | POA: Diagnosis not present

## 2017-01-20 DIAGNOSIS — D631 Anemia in chronic kidney disease: Secondary | ICD-10-CM | POA: Diagnosis not present

## 2017-01-20 DIAGNOSIS — N186 End stage renal disease: Secondary | ICD-10-CM | POA: Diagnosis not present

## 2017-01-20 DIAGNOSIS — N2581 Secondary hyperparathyroidism of renal origin: Secondary | ICD-10-CM | POA: Diagnosis not present

## 2017-01-21 DIAGNOSIS — N186 End stage renal disease: Secondary | ICD-10-CM | POA: Diagnosis not present

## 2017-01-21 DIAGNOSIS — T8612 Kidney transplant failure: Secondary | ICD-10-CM | POA: Diagnosis not present

## 2017-01-21 DIAGNOSIS — Z992 Dependence on renal dialysis: Secondary | ICD-10-CM | POA: Diagnosis not present

## 2017-01-23 ENCOUNTER — Telehealth: Payer: Self-pay | Admitting: Gastroenterology

## 2017-01-23 DIAGNOSIS — N186 End stage renal disease: Secondary | ICD-10-CM | POA: Diagnosis not present

## 2017-01-23 DIAGNOSIS — D631 Anemia in chronic kidney disease: Secondary | ICD-10-CM | POA: Diagnosis not present

## 2017-01-23 DIAGNOSIS — N2581 Secondary hyperparathyroidism of renal origin: Secondary | ICD-10-CM | POA: Diagnosis not present

## 2017-01-23 NOTE — Telephone Encounter (Signed)
Kirk [name] the pt saw Nevin Bloodgood last can you forward to [place].  Thanks.

## 2017-01-23 NOTE — Telephone Encounter (Signed)
Left a message to call back.

## 2017-01-25 DIAGNOSIS — N186 End stage renal disease: Secondary | ICD-10-CM | POA: Diagnosis not present

## 2017-01-25 DIAGNOSIS — D631 Anemia in chronic kidney disease: Secondary | ICD-10-CM | POA: Diagnosis not present

## 2017-01-25 DIAGNOSIS — N2581 Secondary hyperparathyroidism of renal origin: Secondary | ICD-10-CM | POA: Diagnosis not present

## 2017-01-25 NOTE — Telephone Encounter (Signed)
Has seen blood with his bm per spouse. Now see in blood at other times. Denies pain. Appointment scheduled for evaluation. Colonoscopy in August of 2018.

## 2017-01-26 ENCOUNTER — Other Ambulatory Visit: Payer: Self-pay

## 2017-01-26 ENCOUNTER — Emergency Department (HOSPITAL_COMMUNITY): Payer: Medicare Other

## 2017-01-26 ENCOUNTER — Encounter (HOSPITAL_COMMUNITY): Payer: Self-pay | Admitting: *Deleted

## 2017-01-26 ENCOUNTER — Emergency Department (HOSPITAL_COMMUNITY)
Admission: EM | Admit: 2017-01-26 | Discharge: 2017-01-26 | Disposition: A | Discharge status: Home / Self Care | Payer: Medicare Other | Source: Home / Self Care | Attending: Emergency Medicine | Admitting: Emergency Medicine

## 2017-01-26 DIAGNOSIS — F17290 Nicotine dependence, other tobacco product, uncomplicated: Secondary | ICD-10-CM

## 2017-01-26 DIAGNOSIS — J45909 Unspecified asthma, uncomplicated: Secondary | ICD-10-CM

## 2017-01-26 DIAGNOSIS — R5383 Other fatigue: Secondary | ICD-10-CM | POA: Insufficient documentation

## 2017-01-26 DIAGNOSIS — M791 Myalgia, unspecified site: Secondary | ICD-10-CM | POA: Diagnosis not present

## 2017-01-26 DIAGNOSIS — R51 Headache: Secondary | ICD-10-CM | POA: Insufficient documentation

## 2017-01-26 DIAGNOSIS — I120 Hypertensive chronic kidney disease with stage 5 chronic kidney disease or end stage renal disease: Secondary | ICD-10-CM

## 2017-01-26 DIAGNOSIS — J984 Other disorders of lung: Secondary | ICD-10-CM | POA: Diagnosis not present

## 2017-01-26 DIAGNOSIS — R6889 Other general symptoms and signs: Secondary | ICD-10-CM

## 2017-01-26 DIAGNOSIS — J9600 Acute respiratory failure, unspecified whether with hypoxia or hypercapnia: Secondary | ICD-10-CM | POA: Diagnosis not present

## 2017-01-26 DIAGNOSIS — E1122 Type 2 diabetes mellitus with diabetic chronic kidney disease: Secondary | ICD-10-CM

## 2017-01-26 DIAGNOSIS — Z992 Dependence on renal dialysis: Secondary | ICD-10-CM | POA: Insufficient documentation

## 2017-01-26 DIAGNOSIS — N186 End stage renal disease: Secondary | ICD-10-CM

## 2017-01-26 DIAGNOSIS — I5030 Unspecified diastolic (congestive) heart failure: Secondary | ICD-10-CM | POA: Insufficient documentation

## 2017-01-26 DIAGNOSIS — R110 Nausea: Secondary | ICD-10-CM

## 2017-01-26 DIAGNOSIS — I6789 Other cerebrovascular disease: Secondary | ICD-10-CM | POA: Diagnosis not present

## 2017-01-26 DIAGNOSIS — I110 Hypertensive heart disease with heart failure: Secondary | ICD-10-CM

## 2017-01-26 DIAGNOSIS — Z940 Kidney transplant status: Secondary | ICD-10-CM

## 2017-01-26 DIAGNOSIS — J111 Influenza due to unidentified influenza virus with other respiratory manifestations: Secondary | ICD-10-CM | POA: Diagnosis not present

## 2017-01-26 DIAGNOSIS — R0682 Tachypnea, not elsewhere classified: Secondary | ICD-10-CM | POA: Diagnosis not present

## 2017-01-26 DIAGNOSIS — R05 Cough: Secondary | ICD-10-CM | POA: Diagnosis not present

## 2017-01-26 DIAGNOSIS — R0602 Shortness of breath: Secondary | ICD-10-CM

## 2017-01-26 DIAGNOSIS — J9601 Acute respiratory failure with hypoxia: Secondary | ICD-10-CM | POA: Diagnosis not present

## 2017-01-26 DIAGNOSIS — A4189 Other specified sepsis: Secondary | ICD-10-CM | POA: Diagnosis not present

## 2017-01-26 DIAGNOSIS — J1000 Influenza due to other identified influenza virus with unspecified type of pneumonia: Secondary | ICD-10-CM | POA: Diagnosis not present

## 2017-01-26 DIAGNOSIS — R509 Fever, unspecified: Secondary | ICD-10-CM | POA: Diagnosis not present

## 2017-01-26 DIAGNOSIS — D649 Anemia, unspecified: Secondary | ICD-10-CM | POA: Diagnosis not present

## 2017-01-26 LAB — COMPREHENSIVE METABOLIC PANEL
ALBUMIN: 3.5 g/dL (ref 3.5–5.0)
ALT: 19 U/L (ref 17–63)
ANION GAP: 14 (ref 5–15)
AST: 27 U/L (ref 15–41)
Alkaline Phosphatase: 73 U/L (ref 38–126)
BILIRUBIN TOTAL: 1.1 mg/dL (ref 0.3–1.2)
BUN: 48 mg/dL — ABNORMAL HIGH (ref 6–20)
CO2: 23 mmol/L (ref 22–32)
Calcium: 8 mg/dL — ABNORMAL LOW (ref 8.9–10.3)
Chloride: 96 mmol/L — ABNORMAL LOW (ref 101–111)
Creatinine, Ser: 13.22 mg/dL — ABNORMAL HIGH (ref 0.61–1.24)
GFR, EST AFRICAN AMERICAN: 4 mL/min — AB (ref >60)
GFR, EST NON AFRICAN AMERICAN: 4 mL/min — AB (ref >60)
Glucose, Bld: 121 mg/dL — ABNORMAL HIGH (ref 65–99)
POTASSIUM: 3.6 mmol/L (ref 3.5–5.1)
Sodium: 133 mmol/L — ABNORMAL LOW (ref 135–145)
Total Protein: 7.1 g/dL (ref 6.5–8.1)

## 2017-01-26 LAB — CBC WITH DIFFERENTIAL/PLATELET
BASOS ABS: 0 10*3/uL (ref 0.0–0.1)
Basophils Relative: 1 %
Eosinophils Absolute: 0 10*3/uL (ref 0.0–0.7)
Eosinophils Relative: 0 %
HEMATOCRIT: 33.5 % — AB (ref 39.0–52.0)
HEMOGLOBIN: 10.6 g/dL — AB (ref 13.0–17.0)
Lymphocytes Relative: 16 %
Lymphs Abs: 0.5 10*3/uL — ABNORMAL LOW (ref 0.7–4.0)
MCH: 30.5 pg (ref 26.0–34.0)
MCHC: 31.6 g/dL (ref 30.0–36.0)
MCV: 96.3 fL (ref 78.0–100.0)
Monocytes Absolute: 0.2 10*3/uL (ref 0.1–1.0)
Monocytes Relative: 7 %
NEUTROS ABS: 2.7 10*3/uL (ref 1.7–7.7)
NEUTROS PCT: 76 %
Platelets: 89 10*3/uL — ABNORMAL LOW (ref 150–400)
RBC: 3.48 MIL/uL — ABNORMAL LOW (ref 4.22–5.81)
RDW: 14.4 % (ref 11.5–15.5)
WBC: 3.4 10*3/uL — AB (ref 4.0–10.5)

## 2017-01-26 LAB — PROTIME-INR
INR: 1.23
PROTHROMBIN TIME: 15.4 s — AB (ref 11.4–15.2)

## 2017-01-26 LAB — I-STAT CG4 LACTIC ACID, ED: LACTIC ACID, VENOUS: 1.59 mmol/L (ref 0.5–1.9)

## 2017-01-26 MED ORDER — ACETAMINOPHEN 325 MG PO TABS
650.0000 mg | ORAL_TABLET | Freq: Once | ORAL | Status: AC
  Administered 2017-01-26: 650 mg via ORAL
  Filled 2017-01-26: qty 2

## 2017-01-26 MED ORDER — ONDANSETRON 4 MG PO TBDP
4.0000 mg | ORAL_TABLET | Freq: Once | ORAL | Status: AC
  Administered 2017-01-26: 4 mg via ORAL
  Filled 2017-01-26: qty 1

## 2017-01-26 NOTE — ED Triage Notes (Signed)
Pt states headaches, body aches since Wed that increased last night.  Pt also c/o sob, though he completed dialysis  Yesterday.

## 2017-01-26 NOTE — ED Notes (Signed)
Nurse will let me know if she wants me to collect 2nd set blood culture.  Nurse may start IV and collect then.

## 2017-01-26 NOTE — ED Provider Notes (Signed)
[facility]PART[facility] Provider Note   CSN: [id number] Arrival date & time: 01/26/17  1736     History   Chief Complaint Chief Complaint  Patient presents with  . Shortness of Breath  . Generalized Body Aches    HPI Kirk [name] is a 57 y.o. male.  The history is provided by the patient and the spouse.  URI   This is a new problem. The current episode started more than 2 days ago. The problem has not changed since onset.The maximum temperature recorded prior to his arrival was 102 to 102.9 F. The fever has been present for 1 to 2 days. Associated symptoms include nausea, headaches and cough. Pertinent negatives include no chest pain, no abdominal pain, no diarrhea, no vomiting, no dysuria, no ear pain, no sore throat and no rash. He has tried other medications for the symptoms. The treatment provided mild relief.    Past Medical History:  Diagnosis Date  . Acute edema of lung, unspecified   . Acute, but ill-defined, cerebrovascular disease   . Allergy   . Anemia   . 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)   . Anxiety   . Asthma   . Carpal tunnel syndrome   . Cellulitis and abscess of trunk   . Cholelithiasis 07/13/2014  . Chronic headaches   . Debility, unspecified   . Dermatophytosis of the body   . Dysrhythmia    history of  . Edema   . End stage renal disease on dialysis [facility])    "MWF; [facility]" (10/21/2014)  . Essential hypertension, benign   . GERD (gastroesophageal reflux disease)   . Gout, unspecified   . HTN (hypertension)   . Hypertrophy of prostate without urinary obstruction and other lower urinary tract symptoms (LUTS)   . Hypotension, unspecified   . Impotence of organic origin   . Insomnia, unspecified   . Kidney replaced by transplant   . Localization-related (focal) (partial) epilepsy and epileptic syndromes with complex partial seizures, without mention of intractable epilepsy   . Lumbago   .  Memory loss   . OSA on CPAP   . Other and unspecified hyperlipidemia   . Other chronic nonalcoholic liver disease   . Other malaise and fatigue   . Other nonspecific abnormal serum enzyme levels   . Pain in joint, lower leg   . Pain in joint, upper arm   . Pneumonia "several times"  . Renal dialysis status(V45.11) 02/05/2010   restarted 01/02/13 ofter renal trransplant failure  . Secondary hyperparathyroidism (of renal origin)   . Shortness of breath   . Sleep apnea   . Tension headache   . Unspecified constipation   . Unspecified essential hypertension   . Unspecified hereditary and idiopathic peripheral neuropathy   . Unspecified vitamin D deficiency     Patient Active Problem List   Diagnosis Date Noted  . 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  . Hyponatremia   . Acute respiratory failure with hypoxemia ([place]) 10/31/2016  . Clostridium difficile enteritis 10/31/2016  . Pulmonary edema 09/13/2016  . Shunt malfunction 07/03/2016  . Nausea without vomiting 12/14/2015  . End stage renal disease ([place])   . Palpitations 06/29/2015  . Need for hepatitis C screening test 06/29/2015  . Fatigue 06/29/2015  . Myalgia 05/17/2015  . 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 Obesity hypoventilation syndrome ([place]) 12/09/2014  . Extrinsic asthma 12/09/2014  . Narcotic drug use 12/09/2014  . Hypersomnia with sleep apnea 12/09/2014  .  SCC (squamous cell carcinoma), arm 11/09/2014  . Mild persistent chronic asthma without complication 02/54/2706  . ESRD on dialysis ([place]) 09/09/2014  . HLD (hyperlipidemia) 09/09/2014  . Chest pain 09/09/2014  . Dyspnea 06/23/2014  . Pancytopenia ([place]) 04/28/2014  . Thrombocytopenia ([place]) 04/10/2014  . HCAP (healthcare-associated pneumonia) 04/07/2014  . Fungal dermatitis 03/17/2014  . Abdominal pain   . History of surgical procedure 09/17/2013  . Headache(784.0) 04/28/2013  . Awaiting organ transplant 02/13/2013  . HTN (hypertension)   . DM (diabetes  mellitus), type 2 with renal complications ([place])   . Tension headache   . Memory loss   . Anemia in chronic renal disease   . Edema   . Pain in joint, lower leg 05/08/2012  . Thoracic or lumbosacral neuritis or radiculitis 03/27/2012  . Nerve root pain 03/27/2012  . History of kidney transplant 09/10/2011  . Hearing loss 08/16/2011  . Obesity 08/16/2011  . Difficulty hearing 08/16/2011  . OSA on CPAP 08/05/2011    Past Surgical History:  Procedure Laterality Date  . AV FISTULA PLACEMENT Left ?2010   "forearm; at [facility]"  . BACK SURGERY    . CARDIAC CATHETERIZATION  03/21/2011  . CHOLECYSTECTOMY N/A 10/21/2014   Procedure: LAPAROSCOPIC CHOLECYSTECTOMY WITH INTRAOPERATIVE CHOLANGIOGRAM;  Surgeon: Autumn Messing III, MD;  Location: [facility];  Service: General;  Laterality: N/A;  . COLONOSCOPY    . 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 KIDNEY TRANSPLANT  08/17/2011   [place]  10/21/2014   w/IOC  . LEFT HEART CATHETERIZATION WITH CORONARY ANGIOGRAM N/A 03/21/2011   Procedure: LEFT HEART CATHETERIZATION WITH CORONARY ANGIOGRAM;  Surgeon: [name], MD;  Location: [facility] CATH LAB;  Service: Cardiovascular;  Laterality: N/A;  . NEPHRECTOMY  08/2013   removed transplaned kidney  . POSTERIOR FUSION CERVICAL SPINE  06/25/2012   for spinal stenosis  . VASECTOMY  2010       Home Medications    Prior to Admission medications   Medication Sig Start Date End Date Taking? Authorizing Provider  acetaminophen (TYLENOL) 500 MG tablet Take 1 tablet (500 mg total) by mouth every 6 (six) hours as needed for moderate pain. 09/12/14   Samuella Cota, MD  amLODipine (NORVASC) 10 MG tablet TAKE 1 TABLET DAILY 01/03/17   Reed, Tiffany L, DO  aspirin EC 81 MG tablet Take 81 mg by mouth daily.    [provider]  b complex-vitamin c-folic acid (NEPHRO-VITE) 0.8 MG TABS tablet TAKE 1 TABLET BY MOUTH EVERY DAY 12/03/16   Reed,  Tiffany L, DO  budesonide-formoterol (SYMBICORT) 80-4.5 MCG/ACT inhaler Inhale 2 puffs into the lungs 2 (two) times daily. 09/21/14   Bonnielee Haff, MD  calcitRIOL (ROCALTROL) 0.25 MCG capsule Take 1 capsule (0.25 mcg total) by mouth every Monday, Wednesday, and Friday with hemodialysis. 09/21/14   Bonnielee Haff, MD  Cholecalciferol (VITAMIN D3) 5000 UNITS TABS Take 1 tablet by mouth daily.    [provider]  cinacalcet (SENSIPAR) 30 MG tablet Take 30 mg by mouth daily. With dinner.    [provider]  clonazePAM (KLONOPIN) 0.5 MG tablet Take 1 tablet (0.5 mg total) by mouth 2 (two) times daily. Patient taking differently: Take 0.5 mg by mouth 2 (two) times daily as needed.  09/22/16   Orvan Falconer, MD  diphenhydrAMINE (BENADRYL) 25 MG tablet Take 25 mg by mouth at bedtime as needed for allergies.    [provider]  guaiFENesin (MUCINEX) 600 MG 12 hr tablet Take 600 mg by mouth 2 (two) times daily.    [provider]  guaiFENesin-dextromethorphan (ROBITUSSIN DM) 100-10 MG/5ML syrup Take 10 mLs by mouth every 4 (four) hours as needed for cough.    [provider]  hydrALAZINE (APRESOLINE) 50 MG tablet Take 1 tablet (50 mg total) by mouth every 8 (eight) hours. Patient not taking: Reported on 11/23/2016 09/22/16   Orvan Falconer, MD  metoprolol tartrate (LOPRESSOR) 50 MG tablet Take One and a half tablet by mouth twice daily 10/22/16   Reed, Tiffany L, DO  omeprazole (PRILOSEC) 20 MG capsule TAKE 1 CAPSULE BY MOUTH 2 TIMES DAILY BEFORE A MEAL. 12/26/16   Milus Banister, MD  omeprazole (PRILOSEC) 20 MG capsule TAKE 1 CAPSULE BY MOUTH 2 TIMES DAILY BEFORE A MEAL. 01/17/17   Milus Banister, MD  ondansetron (ZOFRAN ODT) 4 MG disintegrating tablet Take 1 tablet (4 mg total) by mouth every 8 (eight) hours as needed for nausea or vomiting. 10/26/16   Kinnie Feil, PA-C  pravastatin (PRAVACHOL) 40 MG tablet TAKE 1 TABLET EVERY EVENING 01/03/17   Reed, Tiffany L, DO    sevelamer carbonate (RENVELA) 800 MG tablet Take 3 tablets (2,400 mg total) by mouth 3 (three) times daily with meals. 09/22/16   Orvan Falconer, MD  traMADol (ULTRAM) 50 MG tablet Take 1 tablet (50 mg total) by mouth daily as needed (severe headache). 10/08/16   Gayland Curry, DO    Family History Family History  Adopted: Yes  Problem Relation Age of Onset  . Colon cancer Neg Hx   . Esophageal cancer Neg Hx   . Rectal cancer Neg Hx   . Stomach cancer Neg Hx     Social History Social History   Tobacco Use  . Smoking status: Current Every Day Smoker    Packs/day: 0.50    Years: 32.00    Pack years: 16.00    Types: E-cigarettes    Last attempt to quit: 08/23/2014    Years since quitting: 2.4  . Smokeless tobacco: Never Used  Substance Use Topics  . Alcohol use: No    Alcohol/week: 0.0 oz  . Drug use: No     Allergies   Codeine   Review of Systems Review of Systems  Constitutional: Positive for appetite change, chills, fatigue and fever.  HENT: Negative for ear pain and sore throat.   Eyes: Negative for pain and visual disturbance.  Respiratory: Positive for cough.   Cardiovascular: Negative for chest pain.  Gastrointestinal: Positive for nausea. Negative for abdominal pain, diarrhea and vomiting.  Genitourinary: Negative for dysuria.  Musculoskeletal: Positive for myalgias. Negative for arthralgias and back pain.  Skin: Negative for rash.  Neurological: Positive for headaches. Negative for syncope and light-headedness.  Psychiatric/Behavioral: Negative for confusion.  All other systems reviewed and are negative.    Physical Exam Updated Vital Signs BP (!) 171/72   Pulse 94   Temp 100.1 F (37.8 C) (Oral)   Resp 20   Ht 5\' 7"  (1.702 m)   Wt 100.7 kg (222 lb)   SpO2 98%   BMI 34.77 kg/m   Physical Exam  Constitutional: He is oriented to person, place, and time. He appears well-developed and well-nourished.  HENT:  Head: Normocephalic and atraumatic.  Eyes:  Conjunctivae are normal.  Neck: Neck supple.  Cardiovascular: Normal rate and regular rhythm.  No murmur heard. Pulmonary/Chest: Effort normal and breath sounds normal. No respiratory distress.  Abdominal: Soft. There is no tenderness.  Musculoskeletal: Normal range of motion. He exhibits no edema.  Neurological: He is alert and oriented to person, place, and time.  Skin: Skin is warm and dry.  Psychiatric: He has a normal mood and affect.  Nursing note and vitals reviewed.    ED Treatments / Results  Labs (all labs ordered are listed, but only abnormal results are displayed) Labs Reviewed  COMPREHENSIVE METABOLIC PANEL - Abnormal; Notable for the following components:      Result Value   Sodium 133 (*)    Chloride 96 (*)    Glucose, Bld 121 (*)    BUN 48 (*)    Creatinine, Ser 13.22 (*)    Calcium 8.0 (*)    GFR calc non Af Amer 4 (*)    GFR calc Af Amer 4 (*)    All other components within normal limits  CBC WITH DIFFERENTIAL/PLATELET - Abnormal; Notable for the following components:   WBC 3.4 (*)    RBC 3.48 (*)    Hemoglobin 10.6 (*)    HCT 33.5 (*)    Platelets 89 (*)    Lymphs Abs 0.5 (*)    All other components within normal limits  PROTIME-INR - Abnormal; Notable for the following components:   Prothrombin Time 15.4 (*)    All other components within normal limits  I-STAT CG4 LACTIC ACID, ED    EKG  EKG Interpretation  Date/Time:  Saturday January 26 2017 18:07:09 EST Ventricular Rate:  98 PR Interval:  152 QRS Duration: 82 QT Interval:  368 QTC Calculation: 469 R Axis:   60 Text Interpretation:  Normal sinus rhythm Minimal voltage criteria for LVH, may be normal variant Nonspecific ST abnormality Abnormal ECG Sinus tachycardia Otherwise no significant change Confirmed by Addison Lank [phone]) on 01/26/2017 7:11:03 PM       Radiology Dg Chest 2 View  Result Date: 01/26/2017 CLINICAL DATA:  Shortness of breath, end-stage renal disease on dialysis,  hypertension, gout EXAM: CHEST  2 VIEW COMPARISON:  11/11/2016 FINDINGS: Enlargement of cardiac silhouette with pulmonary vascular congestion. Mediastinal contours normal. Minimal chronic peribronchial thickening. Slight chronic accentuation of interstitial markings in the mid to lower lungs unchanged since previous exam. No definite acute infiltrate, pleural effusion or pneumothorax. Linear subsegmental atelectasis RIGHT mid lung. No acute osseous findings. IMPRESSION: Enlargement of cardiac silhouette with minimal subsegmental atelectasis in RIGHT mid lung and chronic accentuation of interstitial markings. No definite acute infiltrate. Electronically Signed   By: Lavonia Dana M.D.   On: 01/26/2017 19:10    Procedures Procedures (including critical care time)  Medications Ordered in ED Medications  acetaminophen (TYLENOL) tablet 650 mg (650 mg Oral Given 01/26/17 1825)  ondansetron (ZOFRAN-ODT) disintegrating tablet 4 mg (4 mg Oral Given 01/26/17 1825)     Initial Impression / Assessment and Plan / ED Course  I have reviewed the triage vital signs and the nursing notes.  Pertinent labs & imaging results that were available during my care of the patient were reviewed by me and considered in my medical decision making (see chart for details).     Pt with h/o ESRD on dialysis MWF, HTN, OSA, COPD, dCHF, GERD chronic headaches presents with a flu-like illness. Says since Wednesday, he's been having fevers/chills, headaches, body aches, and generalized weakness; managed to complete his HD session yesterday w/o issue, but just feels generally unwell. Denies lightheadedness, CP, SOB, N/V/D; was visiting a sick family member in the hospital this week,  and has been around sick people at dialysis. He & his wife are concerned about the possibility of PNA, as he's had that previously.  VS & exam as above. CXR w/o focal consolidation. Labs consistent w/ESRD, but otherwise unremarkable. Pt likely suffering from a  viral illness; offered Tamiflu, but the Pt & wife declined. Supportive treatment discussed at bedside.  Explained all results to the Pt & wife. Will discharge the Pt home. Recommending follow-up with PCP. ED return precautions provided. Pt acknowledged understanding of, and concurrence with the plan. All questions answered to his satisfaction. In stable condition at the time of discharge.  Final Clinical Impressions(s) / ED Diagnoses   Final diagnoses:  Flu-like symptoms    ED Discharge Orders    None       Jenny Reichmann, MD 01/26/17 [place]o, [place], MD 01/27/17 [phone]

## 2017-01-28 ENCOUNTER — Inpatient Hospital Stay (HOSPITAL_COMMUNITY)
Admission: EM | Admit: 2017-01-28 | Discharge: 2017-01-30 | DRG: 871 | Disposition: A | Discharge status: Home / Self Care | Payer: Medicare Other | Attending: Family Medicine | Admitting: Family Medicine

## 2017-01-28 ENCOUNTER — Emergency Department (HOSPITAL_COMMUNITY): Payer: Medicare Other

## 2017-01-28 ENCOUNTER — Encounter (HOSPITAL_COMMUNITY): Payer: Self-pay | Admitting: *Deleted

## 2017-01-28 ENCOUNTER — Other Ambulatory Visit: Payer: Self-pay

## 2017-01-28 DIAGNOSIS — F17290 Nicotine dependence, other tobacco product, uncomplicated: Secondary | ICD-10-CM | POA: Diagnosis present

## 2017-01-28 DIAGNOSIS — E1129 Type 2 diabetes mellitus with other diabetic kidney complication: Secondary | ICD-10-CM | POA: Diagnosis not present

## 2017-01-28 DIAGNOSIS — E785 Hyperlipidemia, unspecified: Secondary | ICD-10-CM | POA: Diagnosis present

## 2017-01-28 DIAGNOSIS — D649 Anemia, unspecified: Secondary | ICD-10-CM | POA: Diagnosis not present

## 2017-01-28 DIAGNOSIS — M109 Gout, unspecified: Secondary | ICD-10-CM | POA: Diagnosis present

## 2017-01-28 DIAGNOSIS — G40209 Localization-related (focal) (partial) symptomatic epilepsy and epileptic syndromes with complex partial seizures, not intractable, without status epilepticus: Secondary | ICD-10-CM | POA: Diagnosis present

## 2017-01-28 DIAGNOSIS — R509 Fever, unspecified: Secondary | ICD-10-CM | POA: Diagnosis not present

## 2017-01-28 DIAGNOSIS — G4700 Insomnia, unspecified: Secondary | ICD-10-CM | POA: Diagnosis present

## 2017-01-28 DIAGNOSIS — Z6831 Body mass index (BMI) 31.0-31.9, adult: Secondary | ICD-10-CM | POA: Diagnosis not present

## 2017-01-28 DIAGNOSIS — J810 Acute pulmonary edema: Secondary | ICD-10-CM | POA: Diagnosis not present

## 2017-01-28 DIAGNOSIS — J1000 Influenza due to other identified influenza virus with unspecified type of pneumonia: Secondary | ICD-10-CM | POA: Diagnosis present

## 2017-01-28 DIAGNOSIS — Z9049 Acquired absence of other specified parts of digestive tract: Secondary | ICD-10-CM | POA: Diagnosis not present

## 2017-01-28 DIAGNOSIS — D631 Anemia in chronic kidney disease: Secondary | ICD-10-CM | POA: Diagnosis present

## 2017-01-28 DIAGNOSIS — H9190 Unspecified hearing loss, unspecified ear: Secondary | ICD-10-CM | POA: Diagnosis present

## 2017-01-28 DIAGNOSIS — Z9989 Dependence on other enabling machines and devices: Secondary | ICD-10-CM

## 2017-01-28 DIAGNOSIS — Z905 Acquired absence of kidney: Secondary | ICD-10-CM

## 2017-01-28 DIAGNOSIS — J984 Other disorders of lung: Secondary | ICD-10-CM | POA: Diagnosis not present

## 2017-01-28 DIAGNOSIS — D696 Thrombocytopenia, unspecified: Secondary | ICD-10-CM

## 2017-01-28 DIAGNOSIS — J101 Influenza due to other identified influenza virus with other respiratory manifestations: Secondary | ICD-10-CM | POA: Diagnosis not present

## 2017-01-28 DIAGNOSIS — J9601 Acute respiratory failure with hypoxia: Secondary | ICD-10-CM | POA: Diagnosis not present

## 2017-01-28 DIAGNOSIS — J440 Chronic obstructive pulmonary disease with acute lower respiratory infection: Secondary | ICD-10-CM | POA: Diagnosis present

## 2017-01-28 DIAGNOSIS — Z992 Dependence on renal dialysis: Secondary | ICD-10-CM | POA: Diagnosis not present

## 2017-01-28 DIAGNOSIS — K219 Gastro-esophageal reflux disease without esophagitis: Secondary | ICD-10-CM | POA: Diagnosis present

## 2017-01-28 DIAGNOSIS — I120 Hypertensive chronic kidney disease with stage 5 chronic kidney disease or end stage renal disease: Secondary | ICD-10-CM | POA: Diagnosis present

## 2017-01-28 DIAGNOSIS — A4189 Other specified sepsis: Principal | ICD-10-CM | POA: Diagnosis present

## 2017-01-28 DIAGNOSIS — Z940 Kidney transplant status: Secondary | ICD-10-CM | POA: Diagnosis not present

## 2017-01-28 DIAGNOSIS — J189 Pneumonia, unspecified organism: Secondary | ICD-10-CM

## 2017-01-28 DIAGNOSIS — R0602 Shortness of breath: Secondary | ICD-10-CM | POA: Diagnosis not present

## 2017-01-28 DIAGNOSIS — Z885 Allergy status to narcotic agent status: Secondary | ICD-10-CM

## 2017-01-28 DIAGNOSIS — J4530 Mild persistent asthma, uncomplicated: Secondary | ICD-10-CM | POA: Diagnosis present

## 2017-01-28 DIAGNOSIS — R0603 Acute respiratory distress: Secondary | ICD-10-CM | POA: Diagnosis present

## 2017-01-28 DIAGNOSIS — I6789 Other cerebrovascular disease: Secondary | ICD-10-CM | POA: Diagnosis present

## 2017-01-28 DIAGNOSIS — G609 Hereditary and idiopathic neuropathy, unspecified: Secondary | ICD-10-CM | POA: Diagnosis present

## 2017-01-28 DIAGNOSIS — A413 Sepsis due to Hemophilus influenzae: Secondary | ICD-10-CM | POA: Diagnosis not present

## 2017-01-28 DIAGNOSIS — N186 End stage renal disease: Secondary | ICD-10-CM | POA: Diagnosis present

## 2017-01-28 DIAGNOSIS — G4733 Obstructive sleep apnea (adult) (pediatric): Secondary | ICD-10-CM

## 2017-01-28 DIAGNOSIS — E662 Morbid (severe) obesity with alveolar hypoventilation: Secondary | ICD-10-CM | POA: Diagnosis present

## 2017-01-28 DIAGNOSIS — Z66 Do not resuscitate: Secondary | ICD-10-CM | POA: Diagnosis present

## 2017-01-28 DIAGNOSIS — Z7982 Long term (current) use of aspirin: Secondary | ICD-10-CM | POA: Diagnosis not present

## 2017-01-28 DIAGNOSIS — I501 Left ventricular failure: Secondary | ICD-10-CM | POA: Diagnosis present

## 2017-01-28 DIAGNOSIS — E1122 Type 2 diabetes mellitus with diabetic chronic kidney disease: Secondary | ICD-10-CM | POA: Diagnosis present

## 2017-01-28 DIAGNOSIS — G4731 Primary central sleep apnea: Secondary | ICD-10-CM | POA: Diagnosis not present

## 2017-01-28 DIAGNOSIS — J181 Lobar pneumonia, unspecified organism: Secondary | ICD-10-CM | POA: Diagnosis not present

## 2017-01-28 DIAGNOSIS — J9600 Acute respiratory failure, unspecified whether with hypoxia or hypercapnia: Secondary | ICD-10-CM

## 2017-01-28 DIAGNOSIS — N2581 Secondary hyperparathyroidism of renal origin: Secondary | ICD-10-CM | POA: Diagnosis present

## 2017-01-28 DIAGNOSIS — J811 Chronic pulmonary edema: Secondary | ICD-10-CM | POA: Diagnosis present

## 2017-01-28 DIAGNOSIS — I509 Heart failure, unspecified: Secondary | ICD-10-CM | POA: Diagnosis not present

## 2017-01-28 DIAGNOSIS — A419 Sepsis, unspecified organism: Secondary | ICD-10-CM

## 2017-01-28 DIAGNOSIS — R0682 Tachypnea, not elsewhere classified: Secondary | ICD-10-CM | POA: Diagnosis not present

## 2017-01-28 LAB — BLOOD GAS, ARTERIAL
Acid-base deficit: 4.9 mmol/L — ABNORMAL HIGH (ref 0.0–2.0)
BICARBONATE: 20.6 mmol/L (ref 20.0–28.0)
DRAWN BY: 331001
Delivery systems: POSITIVE
Expiratory PAP: 5
FIO2: 50
Inspiratory PAP: 10
Mode: POSITIVE
O2 Saturation: 97.5 %
PATIENT TEMPERATURE: 100.6
PO2 ART: 119 mmHg — AB (ref 83.0–108.0)
pCO2 arterial: 35.8 mmHg (ref 32.0–48.0)
pH, Arterial: 7.359 (ref 7.350–7.450)

## 2017-01-28 LAB — GLUCOSE, CAPILLARY
Glucose-Capillary: 133 mg/dL — ABNORMAL HIGH (ref 65–99)
Glucose-Capillary: 144 mg/dL — ABNORMAL HIGH (ref 65–99)

## 2017-01-28 LAB — CBC WITH DIFFERENTIAL/PLATELET
Basophils Absolute: 0 10*3/uL (ref 0.0–0.1)
Basophils Relative: 0 %
EOS ABS: 0 10*3/uL (ref 0.0–0.7)
Eosinophils Relative: 0 %
HEMATOCRIT: 32.4 % — AB (ref 39.0–52.0)
HEMOGLOBIN: 10.3 g/dL — AB (ref 13.0–17.0)
LYMPHS ABS: 0.4 10*3/uL — AB (ref 0.7–4.0)
Lymphocytes Relative: 9 %
MCH: 30.7 pg (ref 26.0–34.0)
MCHC: 31.8 g/dL (ref 30.0–36.0)
MCV: 96.4 fL (ref 78.0–100.0)
MONOS PCT: 10 %
Monocytes Absolute: 0.4 10*3/uL (ref 0.1–1.0)
NEUTROS ABS: 3.5 10*3/uL (ref 1.7–7.7)
NEUTROS PCT: 81 %
Platelets: 67 10*3/uL — ABNORMAL LOW (ref 150–400)
RBC: 3.36 MIL/uL — ABNORMAL LOW (ref 4.22–5.81)
RDW: 14.5 % (ref 11.5–15.5)
WBC: 4.4 10*3/uL (ref 4.0–10.5)

## 2017-01-28 LAB — COMPREHENSIVE METABOLIC PANEL
ALBUMIN: 3.7 g/dL (ref 3.5–5.0)
ALK PHOS: 75 U/L (ref 38–126)
ALT: 31 U/L (ref 17–63)
ANION GAP: 20 — AB (ref 5–15)
AST: 35 U/L (ref 15–41)
BUN: 77 mg/dL — AB (ref 6–20)
CALCIUM: 8.4 mg/dL — AB (ref 8.9–10.3)
CO2: 18 mmol/L — AB (ref 22–32)
Chloride: 97 mmol/L — ABNORMAL LOW (ref 101–111)
Creatinine, Ser: 18.05 mg/dL — ABNORMAL HIGH (ref 0.61–1.24)
GFR calc Af Amer: 3 mL/min — ABNORMAL LOW (ref >60)
GFR calc non Af Amer: 3 mL/min — ABNORMAL LOW (ref >60)
GLUCOSE: 113 mg/dL — AB (ref 65–99)
Potassium: 4.7 mmol/L (ref 3.5–5.1)
Sodium: 135 mmol/L (ref 135–145)
Total Bilirubin: 1.3 mg/dL — ABNORMAL HIGH (ref 0.3–1.2)
Total Protein: 7.4 g/dL (ref 6.5–8.1)

## 2017-01-28 LAB — LACTIC ACID, PLASMA
LACTIC ACID, VENOUS: 1.1 mmol/L (ref 0.5–1.9)
LACTIC ACID, VENOUS: 1.8 mmol/L (ref 0.5–1.9)

## 2017-01-28 LAB — INFLUENZA PANEL BY PCR (TYPE A & B)
Influenza A By PCR: POSITIVE — AB
Influenza B By PCR: NEGATIVE

## 2017-01-28 LAB — MRSA PCR SCREENING: MRSA by PCR: NEGATIVE

## 2017-01-28 MED ORDER — OSELTAMIVIR PHOSPHATE 30 MG PO CAPS
30.0000 mg | ORAL_CAPSULE | Freq: Once | ORAL | Status: AC
  Administered 2017-01-28: 30 mg via ORAL
  Filled 2017-01-28: qty 1

## 2017-01-28 MED ORDER — PIPERACILLIN-TAZOBACTAM 3.375 G IVPB 30 MIN
3.3750 g | Freq: Once | INTRAVENOUS | Status: AC
  Administered 2017-01-28: 3.375 g via INTRAVENOUS
  Filled 2017-01-28: qty 50

## 2017-01-28 MED ORDER — PIPERACILLIN-TAZOBACTAM 3.375 G IVPB
3.3750 g | Freq: Two times a day (BID) | INTRAVENOUS | Status: DC
  Administered 2017-01-28 – 2017-01-29 (×2): 3.375 g via INTRAVENOUS
  Filled 2017-01-28 (×2): qty 50

## 2017-01-28 MED ORDER — RENA-VITE PO TABS
1.0000 | ORAL_TABLET | Freq: Every day | ORAL | Status: DC
  Administered 2017-01-28 – 2017-01-29 (×2): 1 via ORAL
  Filled 2017-01-28 (×2): qty 1

## 2017-01-28 MED ORDER — OSELTAMIVIR PHOSPHATE 30 MG PO CAPS
30.0000 mg | ORAL_CAPSULE | ORAL | Status: DC
  Administered 2017-01-30: 30 mg via ORAL
  Filled 2017-01-28: qty 1

## 2017-01-28 MED ORDER — ALBUTEROL SULFATE (2.5 MG/3ML) 0.083% IN NEBU
5.0000 mg | INHALATION_SOLUTION | Freq: Once | RESPIRATORY_TRACT | Status: AC
  Administered 2017-01-28: 2.5 mg via RESPIRATORY_TRACT

## 2017-01-28 MED ORDER — VANCOMYCIN HCL IN DEXTROSE 1-5 GM/200ML-% IV SOLN
1000.0000 mg | INTRAVENOUS | Status: DC
  Administered 2017-01-28: 1000 mg via INTRAVENOUS
  Filled 2017-01-28: qty 200

## 2017-01-28 MED ORDER — CLONAZEPAM 0.5 MG PO TABS: 0.5000 mg | ORAL_TABLET | Freq: Two times a day (BID) | ORAL | Status: DC | PRN

## 2017-01-28 MED ORDER — IPRATROPIUM-ALBUTEROL 0.5-2.5 (3) MG/3ML IN SOLN
RESPIRATORY_TRACT | Status: AC
  Administered 2017-01-28: 3 mL
  Filled 2017-01-28: qty 3

## 2017-01-28 MED ORDER — VANCOMYCIN HCL IN DEXTROSE 1-5 GM/200ML-% IV SOLN
1000.0000 mg | Freq: Once | INTRAVENOUS | Status: AC
  Administered 2017-01-28: 1000 mg via INTRAVENOUS
  Filled 2017-01-28: qty 200

## 2017-01-28 MED ORDER — CINACALCET HCL 30 MG PO TABS
30.0000 mg | ORAL_TABLET | Freq: Every day | ORAL | Status: DC
  Administered 2017-01-29 – 2017-01-30 (×2): 30 mg via ORAL
  Filled 2017-01-28 (×3): qty 1

## 2017-01-28 MED ORDER — ALBUTEROL SULFATE (2.5 MG/3ML) 0.083% IN NEBU
INHALATION_SOLUTION | RESPIRATORY_TRACT | Status: AC
  Administered 2017-01-28: 2.5 mg via RESPIRATORY_TRACT
  Filled 2017-01-28: qty 3

## 2017-01-28 MED ORDER — METOPROLOL TARTRATE 50 MG PO TABS
75.0000 mg | ORAL_TABLET | Freq: Two times a day (BID) | ORAL | Status: DC
  Administered 2017-01-28 – 2017-01-29 (×3): 75 mg via ORAL
  Filled 2017-01-28 (×3): qty 1

## 2017-01-28 MED ORDER — HYDRALAZINE HCL 25 MG PO TABS
50.0000 mg | ORAL_TABLET | Freq: Three times a day (TID) | ORAL | Status: DC
  Administered 2017-01-28 – 2017-01-30 (×5): 50 mg via ORAL
  Filled 2017-01-28 (×5): qty 2

## 2017-01-28 MED ORDER — METHYLPREDNISOLONE SODIUM SUCC 125 MG IJ SOLR
60.0000 mg | Freq: Two times a day (BID) | INTRAMUSCULAR | Status: DC
  Administered 2017-01-28 – 2017-01-30 (×5): 60 mg via INTRAVENOUS
  Filled 2017-01-28 (×5): qty 2

## 2017-01-28 MED ORDER — CALCITRIOL 0.25 MCG PO CAPS
0.2500 ug | ORAL_CAPSULE | ORAL | Status: DC
  Administered 2017-01-28 – 2017-01-30 (×2): 0.25 ug via ORAL
  Filled 2017-01-28 (×2): qty 1

## 2017-01-28 MED ORDER — GUAIFENESIN ER 600 MG PO TB12
600.0000 mg | ORAL_TABLET | Freq: Two times a day (BID) | ORAL | Status: DC
  Administered 2017-01-28 – 2017-01-30 (×5): 600 mg via ORAL
  Filled 2017-01-28 (×5): qty 1

## 2017-01-28 MED ORDER — ACETAMINOPHEN 650 MG RE SUPP: 650.0000 mg | Freq: Four times a day (QID) | RECTAL | Status: DC | PRN

## 2017-01-28 MED ORDER — ONDANSETRON 4 MG PO TBDP
4.0000 mg | ORAL_TABLET | Freq: Three times a day (TID) | ORAL | Status: DC | PRN
  Filled 2017-01-28: qty 1

## 2017-01-28 MED ORDER — AMLODIPINE BESYLATE 5 MG PO TABS
10.0000 mg | ORAL_TABLET | Freq: Every day | ORAL | Status: DC
  Administered 2017-01-29: 10 mg via ORAL
  Filled 2017-01-28: qty 2

## 2017-01-28 MED ORDER — PRAVASTATIN SODIUM 40 MG PO TABS
40.0000 mg | ORAL_TABLET | Freq: Every evening | ORAL | Status: DC
  Administered 2017-01-28 – 2017-01-30 (×3): 40 mg via ORAL
  Filled 2017-01-28 (×4): qty 1

## 2017-01-28 MED ORDER — PANTOPRAZOLE SODIUM 40 MG PO TBEC
40.0000 mg | DELAYED_RELEASE_TABLET | Freq: Every day | ORAL | Status: DC
  Administered 2017-01-28 – 2017-01-30 (×3): 40 mg via ORAL
  Filled 2017-01-28 (×3): qty 1

## 2017-01-28 MED ORDER — INSULIN ASPART 100 UNIT/ML ~~LOC~~ SOLN
0.0000 [IU] | Freq: Three times a day (TID) | SUBCUTANEOUS | Status: DC
  Administered 2017-01-28: 1 [IU] via SUBCUTANEOUS
  Administered 2017-01-29: 2 [IU] via SUBCUTANEOUS
  Administered 2017-01-29 (×2): 1 [IU] via SUBCUTANEOUS
  Administered 2017-01-30: 2 [IU] via SUBCUTANEOUS

## 2017-01-28 MED ORDER — ACETAMINOPHEN 325 MG PO TABS: 650.0000 mg | ORAL_TABLET | Freq: Four times a day (QID) | ORAL | Status: DC | PRN

## 2017-01-28 MED ORDER — INSULIN ASPART 100 UNIT/ML ~~LOC~~ SOLN: 0.0000 [IU] | Freq: Every day | SUBCUTANEOUS | Status: DC

## 2017-01-28 MED ORDER — VITAMIN D 1000 UNITS PO TABS
5000.0000 [IU] | ORAL_TABLET | Freq: Every day | ORAL | Status: DC
  Administered 2017-01-28 – 2017-01-30 (×3): 5000 [IU] via ORAL
  Filled 2017-01-28 (×3): qty 5

## 2017-01-28 MED ORDER — MOMETASONE FURO-FORMOTEROL FUM 100-5 MCG/ACT IN AERO
2.0000 | INHALATION_SPRAY | Freq: Two times a day (BID) | RESPIRATORY_TRACT | Status: DC
  Administered 2017-01-28 – 2017-01-30 (×4): 2 via RESPIRATORY_TRACT
  Filled 2017-01-28: qty 8.8

## 2017-01-28 MED ORDER — SEVELAMER CARBONATE 800 MG PO TABS
2400.0000 mg | ORAL_TABLET | Freq: Three times a day (TID) | ORAL | Status: DC
  Administered 2017-01-28 – 2017-01-30 (×6): 2400 mg via ORAL
  Filled 2017-01-28 (×6): qty 3

## 2017-01-28 MED ORDER — GUAIFENESIN-DM 100-10 MG/5ML PO SYRP: 10.0000 mL | ORAL_SOLUTION | ORAL | Status: DC | PRN

## 2017-01-28 MED ORDER — ACETAMINOPHEN 500 MG PO TABS: 1000.0000 mg | ORAL_TABLET | Freq: Once | ORAL | Status: DC

## 2017-01-28 MED ORDER — DIPHENHYDRAMINE HCL 25 MG PO CAPS: 25.0000 mg | ORAL_CAPSULE | Freq: Every evening | ORAL | Status: DC | PRN

## 2017-01-28 MED ORDER — OSELTAMIVIR PHOSPHATE 30 MG PO CAPS
30.0000 mg | ORAL_CAPSULE | Freq: Once | ORAL | Status: DC
  Filled 2017-01-28: qty 1

## 2017-01-28 NOTE — Progress Notes (Addendum)
Pharmacy Note:  Initial antibiotic(s) regimen of Vancomycin and Zosyn ordered by EDP to treat Sepsis.  Estimated Creatinine Clearance: 5 mL/min (A) (by C-G formula based on SCr of 18.05 mg/dL (H)).   Allergies  Allergen Reactions  . Codeine Nausea And Vomiting and Nausea Only    Altered mental status    Vitals:   01/28/17 0700 01/28/17 0701  BP: (!) 203/98 (!) 203/98  Pulse: 88 90  Resp: (!) 36 (!) 36  Temp:    SpO2: 100% 100%    Anti-infectives (From admission, onward)   Start     Dose/Rate Route Frequency Ordered Stop   01/28/17 0715  piperacillin-tazobactam (ZOSYN) IVPB 3.375 g     3.375 g 100 mL/hr over 30 Minutes Intravenous  Once 01/28/17 0713     01/28/17 0715  vancomycin (VANCOCIN) IVPB 1000 mg/200 mL premix     1,000 mg 200 mL/hr over 60 Minutes Intravenous  Once 01/28/17 0713        Plan: Initial dose(s) of Vancomycin and Zosyn X 1 ordered. F/U admission orders for further dosing if therapy continued.  Pricilla Larsson, [facility] 01/28/2017 7:51 AM   Addendum: Vancomycin and Zosyn continued on admission. ESRD dosing. Vancomycin 1gm IV every HD. Pre-Hemodialysis Vancomycin level goal range =15-25 mcg/ml  Check Pre-HD level next week if therapy continues. Zosyn 3.375gm IV every 12 hours (4 hour infusion).

## 2017-01-28 NOTE — ED Notes (Signed)
Pt became very sob when the doctor had the nursing staff remove all clothes.

## 2017-01-28 NOTE — ED Provider Notes (Signed)
[facility] EMERGENCY DEPARTMENT Provider Note   CSN: [id number] Arrival date & time: 01/28/17  8938     History   Chief Complaint Chief Complaint  Patient presents with  . Shortness of Breath   Level 5 caveat acuity of situation HPI Kirk [name] is a 57 y.o. male.   Complains of fever 103 degrees shortness of breath, nonproductive cough and wheezing and diffuse body aches onset 3 days ago symptoms accompanied by nausea.  No vomiting.  He was treated with Tylenol last dose 12:30 AM today.  Also received home nebulizer treatment.  He went to dialysis today and was noted to have fever, was sent here for further evaluation.  Also seen at [facility] emergency department on 01/27/2016 determined to have flulike symptoms.  Discharge from the emergency department.  No other associated symptoms patient reports he did get a flu shot this year nothing makes symptoms better or worse.  No other associated symptoms. started on BiPAP prior to my exam  HPI  Past Medical History:  Diagnosis Date  . Acute edema of lung, unspecified   . Acute, but ill-defined, cerebrovascular disease   . Allergy   . Anemia   . 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)   . Anxiety   . Asthma   . Carpal tunnel syndrome   . Cellulitis and abscess of trunk   . Cholelithiasis 07/13/2014  . Chronic headaches   . Debility, unspecified   . Dermatophytosis of the body   . Dysrhythmia    history of  . Edema   . End stage renal disease on dialysis [facility])    "MWF; [facility]" (10/21/2014)  . Essential hypertension, benign   . GERD (gastroesophageal reflux disease)   . Gout, unspecified   . HTN (hypertension)   . Hypertrophy of prostate without urinary obstruction and other lower urinary tract symptoms (LUTS)   . Hypotension, unspecified   . Impotence of organic origin   . Insomnia, unspecified   . Kidney replaced by transplant   . Localization-related (focal) (partial) epilepsy and epileptic  syndromes with complex partial seizures, without mention of intractable epilepsy   . Lumbago   . Memory loss   . OSA on CPAP   . Other and unspecified hyperlipidemia   . Other chronic nonalcoholic liver disease   . Other malaise and fatigue   . Other nonspecific abnormal serum enzyme levels   . Pain in joint, lower leg   . Pain in joint, upper arm   . Pneumonia "several times"  . Renal dialysis status(V45.11) 02/05/2010   restarted 01/02/13 ofter renal trransplant failure  . Secondary hyperparathyroidism (of renal origin)   . Shortness of breath   . Sleep apnea   . Tension headache   . Unspecified constipation   . Unspecified essential hypertension   . Unspecified hereditary and idiopathic peripheral neuropathy   . Unspecified vitamin D deficiency     Patient Active Problem List   Diagnosis Date Noted  . 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  . Hyponatremia   . Acute respiratory failure with hypoxemia ([place]) 10/31/2016  . Clostridium difficile enteritis 10/31/2016  . Pulmonary edema 09/13/2016  . Shunt malfunction 07/03/2016  . Nausea without vomiting 12/14/2015  . End stage renal disease ([place])   . Palpitations 06/29/2015  . Need for hepatitis C screening test 06/29/2015  . Fatigue 06/29/2015  . Myalgia 05/17/2015  . 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 Obesity hypoventilation syndrome ([place]) 12/09/2014  . Extrinsic asthma  12/09/2014  . Narcotic drug use 12/09/2014  . Hypersomnia with sleep apnea 12/09/2014  . SCC (squamous cell carcinoma), arm 11/09/2014  . Mild persistent chronic asthma without complication 41/66/0630  . ESRD on dialysis ([place]) 09/09/2014  . HLD (hyperlipidemia) 09/09/2014  . Chest pain 09/09/2014  . Dyspnea 06/23/2014  . Pancytopenia ([place]) 04/28/2014  . Thrombocytopenia ([place]) 04/10/2014  . HCAP (healthcare-associated pneumonia) 04/07/2014  . Fungal dermatitis 03/17/2014  . Abdominal pain   . History of surgical procedure 09/17/2013  . Headache(784.0)  04/28/2013  . Awaiting organ transplant 02/13/2013  . HTN (hypertension)   . DM (diabetes mellitus), type 2 with renal complications ([place])   . Tension headache   . Memory loss   . Anemia in chronic renal disease   . Edema   . Pain in joint, lower leg 05/08/2012  . Thoracic or lumbosacral neuritis or radiculitis 03/27/2012  . Nerve root pain 03/27/2012  . History of kidney transplant 09/10/2011  . Hearing loss 08/16/2011  . Obesity 08/16/2011  . Difficulty hearing 08/16/2011  . OSA on CPAP 08/05/2011    Past Surgical History:  Procedure Laterality Date  . AV FISTULA PLACEMENT Left ?2010   "forearm; at [facility]"  . BACK SURGERY    . CARDIAC CATHETERIZATION  03/21/2011  . CHOLECYSTECTOMY N/A 10/21/2014   Procedure: LAPAROSCOPIC CHOLECYSTECTOMY WITH INTRAOPERATIVE CHOLANGIOGRAM;  Surgeon: Autumn Messing III, MD;  Location: [place];  Service: General;  Laterality: N/A;  . COLONOSCOPY    . 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 KIDNEY TRANSPLANT  08/17/2011   [place]  10/21/2014   w/IOC  . LEFT HEART CATHETERIZATION WITH CORONARY ANGIOGRAM N/A 03/21/2011   Procedure: LEFT HEART CATHETERIZATION WITH CORONARY ANGIOGRAM;  Surgeon: [name], MD;  Location[facility] C[facility] LAB;  Service: Cardiovascular;  Laterality: N/A;  . NEPHRECTOMY  08/2013   removed transplaned kidney  . POSTERIOR FUSION CERVICAL SPINE  06/25/2012   for spinal stenosis  . VASECTOMY  2010       Home Medications    Prior to Admission medications   Medication Sig Start Date End Date Taking? Authorizing Provider  acetaminophen (TYLENOL) 500 MG tablet Take 1 tablet (500 mg total) by mouth every 6 (six) hours as needed for moderate pain. 09/12/14   Samuella Cota, MD  amLODipine (NORVASC) 10 MG tablet TAKE 1 TABLET DAILY 01/03/17   Reed, Tiffany L, DO  aspirin EC 81 MG tablet Take 81 mg by mouth daily.    [provider]  b complex-vitamin  c-folic acid (NEPHRO-VITE) 0.8 MG TABS tablet TAKE 1 TABLET BY MOUTH EVERY DAY 12/03/16   Reed, Tiffany L, DO  budesonide-formoterol (SYMBICORT) 80-4.5 MCG/ACT inhaler Inhale 2 puffs into the lungs 2 (two) times daily. 09/21/14   Bonnielee Haff, MD  calcitRIOL (ROCALTROL) 0.25 MCG capsule Take 1 capsule (0.25 mcg total) by mouth every Monday, Wednesday, and Friday with hemodialysis. 09/21/14   Bonnielee Haff, MD  Cholecalciferol (VITAMIN D3) 5000 UNITS TABS Take 1 tablet by mouth daily.    [provider]  cinacalcet (SENSIPAR) 30 MG tablet Take 30 mg by mouth daily. With dinner.    [provider]  clonazePAM (KLONOPIN) 0.5 MG tablet Take 1 tablet (0.5 mg total) by mouth 2 (two) times daily. Patient taking differently: Take 0.5 mg by mouth 2 (two) times daily as needed.  09/22/16   Orvan Falconer, MD  diphenhydrAMINE (BENADRYL) 25 MG tablet Take  25 mg by mouth at bedtime as needed for allergies.    [provider]  guaiFENesin (MUCINEX) 600 MG 12 hr tablet Take 600 mg by mouth 2 (two) times daily.    [provider]  guaiFENesin-dextromethorphan (ROBITUSSIN DM) 100-10 MG/5ML syrup Take 10 mLs by mouth every 4 (four) hours as needed for cough.    [provider]  hydrALAZINE (APRESOLINE) 50 MG tablet Take 1 tablet (50 mg total) by mouth every 8 (eight) hours. Patient not taking: Reported on 11/23/2016 09/22/16   Orvan Falconer, MD  metoprolol tartrate (LOPRESSOR) 50 MG tablet Take One and a half tablet by mouth twice daily 10/22/16   Reed, Tiffany L, DO  omeprazole (PRILOSEC) 20 MG capsule TAKE 1 CAPSULE BY MOUTH 2 TIMES DAILY BEFORE A MEAL. 12/26/16   Milus Banister, MD  omeprazole (PRILOSEC) 20 MG capsule TAKE 1 CAPSULE BY MOUTH 2 TIMES DAILY BEFORE A MEAL. 01/17/17   Milus Banister, MD  ondansetron (ZOFRAN ODT) 4 MG disintegrating tablet Take 1 tablet (4 mg total) by mouth every 8 (eight) hours as needed for nausea or vomiting. 10/26/16   Kinnie Feil, PA-C    pravastatin (PRAVACHOL) 40 MG tablet TAKE 1 TABLET EVERY EVENING 01/03/17   Reed, Tiffany L, DO  sevelamer carbonate (RENVELA) 800 MG tablet Take 3 tablets (2,400 mg total) by mouth 3 (three) times daily with meals. 09/22/16   Orvan Falconer, MD  traMADol (ULTRAM) 50 MG tablet Take 1 tablet (50 mg total) by mouth daily as needed (severe headache). 10/08/16   Gayland Curry, DO    Family History Family History  Adopted: Yes  Problem Relation Age of Onset  . Colon cancer Neg Hx   . Esophageal cancer Neg Hx   . Rectal cancer Neg Hx   . Stomach cancer Neg Hx     Social History Social History   Tobacco Use  . Smoking status: Current Every Day Smoker    Packs/day: 0.50    Years: 32.00    Pack years: 16.00    Types: E-cigarettes    Last attempt to quit: 08/23/2014    Years since quitting: 2.4  . Smokeless tobacco: Never Used  Substance Use Topics  . Alcohol use: No    Alcohol/week: 0.0 oz  . Drug use: No     Allergies   Codeine   Review of Systems Review of Systems  Unable to perform ROS: Acuity of condition  Respiratory: Positive for cough, shortness of breath and wheezing.   Gastrointestinal: Positive for nausea.  Genitourinary:       Anuric  Musculoskeletal: Positive for myalgias.     Physical Exam Updated Vital Signs BP (!) 203/98   Pulse 90   Temp (!) 100.6 F (38.1 C) (Oral)   Resp (!) 36   Ht 5\' 7"  (1.702 m)   Wt 90.4 kg (199 lb 3.2 oz)   SpO2 100%   BMI 31.20 kg/m   Physical Exam  Constitutional: He appears well-developed and well-nourished.  Appears mildly uncomfortable  HENT:  Head: Normocephalic and atraumatic.  Eyes: Conjunctivae are normal. Pupils are equal, round, and reactive to light.  Neck: Neck supple. No tracheal deviation present. No thyromegaly present.  Cardiovascular: Normal rate and regular rhythm.  No murmur heard. Pulmonary/Chest: He has wheezes.  Tachypnea  Abdominal: Soft. Bowel sounds are normal. He exhibits no distension. There  is no tenderness.  Obese  Genitourinary: Penis normal.  Genitourinary Comments: Perineum normal  Musculoskeletal: Normal  range of motion. He exhibits no edema or tenderness.  allv 4 extremities without redness swelling or tenderness neurovascularly intact  Neurological: He is alert. Coordination normal.  Skin: Skin is warm and dry. No rash noted.  Psychiatric: He has a normal mood and affect.  Nursing note and vitals reviewed.    ED Treatments / Results  Labs (all labs ordered are listed, but only abnormal results are displayed) Labs Reviewed  CULTURE, BLOOD (ROUTINE X 2)  CULTURE, BLOOD (ROUTINE X 2)  COMPREHENSIVE METABOLIC PANEL  CBC WITH DIFFERENTIAL/PLATELET  I-STAT CG4 LACTIC ACID, ED   chestX-ray viewed by me Results for orders placed or performed during the hospital encounter of 01/28/17  Blood Culture (routine x 2)  Result Value Ref Range   Specimen Description BLOOD RIGHT ARM    Special Requests      BOTTLES DRAWN AEROBIC AND ANAEROBIC Blood Culture results may not be optimal due to an inadequate volume of blood received in culture bottles   Culture PENDING    Report Status PENDING   Blood Culture (routine x 2)  Result Value Ref Range   Specimen Description BLOOD RIGHT ARM    Special Requests      BOTTLES DRAWN AEROBIC AND ANAEROBIC Blood Culture adequate volume   Culture PENDING    Report Status PENDING   Comprehensive metabolic panel  Result Value Ref Range   Sodium 135 135 - 145 mmol/L   Potassium 4.7 3.5 - 5.1 mmol/L   Chloride 97 (L) 101 - 111 mmol/L   CO2 18 (L) 22 - 32 mmol/L   Glucose, Bld 113 (H) 65 - 99 mg/dL   BUN 77 (H) 6 - 20 mg/dL   Creatinine, Ser 18.05 (H) 0.61 - 1.24 mg/dL   Calcium 8.4 (L) 8.9 - 10.3 mg/dL   Total Protein 7.4 6.5 - 8.1 g/dL   Albumin 3.7 3.5 - 5.0 g/dL   AST 35 15 - 41 U/L   ALT 31 17 - 63 U/L   Alkaline Phosphatase 75 38 - 126 U/L   Total Bilirubin 1.3 (H) 0.3 - 1.2 mg/dL   GFR calc non Af Amer 3 (L) >60 mL/min    GFR calc Af Amer 3 (L) >60 mL/min   Anion gap 20 (H) 5 - 15  CBC WITH DIFFERENTIAL  Result Value Ref Range   WBC 4.4 4.0 - 10.5 K/uL   RBC 3.36 (L) 4.22 - 5.81 MIL/uL   Hemoglobin 10.3 (L) 13.0 - 17.0 g/dL   HCT 32.4 (L) 39.0 - 52.0 %   MCV 96.4 78.0 - 100.0 fL   MCH 30.7 26.0 - 34.0 pg   MCHC 31.8 30.0 - 36.0 g/dL   RDW 14.5 11.5 - 15.5 %   Platelets 67 (L) 150 - 400 K/uL   Neutrophils Relative % 81 %   Neutro Abs 3.5 1.7 - 7.7 K/uL   Lymphocytes Relative 9 %   Lymphs Abs 0.4 (L) 0.7 - 4.0 K/uL   Monocytes Relative 10 %   Monocytes Absolute 0.4 0.1 - 1.0 K/uL   Eosinophils Relative 0 %   Eosinophils Absolute 0.0 0.0 - 0.7 K/uL   Basophils Relative 0 %   Basophils Absolute 0.0 0.0 - 0.1 K/uL  Lactic acid, plasma  Result Value Ref Range   Lactic Acid, Venous 1.1 0.5 - 1.9 mmol/L   Dg Chest 2 View  Result Date: 01/26/2017 CLINICAL DATA:  Shortness of breath, end-stage renal disease on dialysis, hypertension, gout EXAM: CHEST  2  VIEW COMPARISON:  11/11/2016 FINDINGS: Enlargement of cardiac silhouette with pulmonary vascular congestion. Mediastinal contours normal. Minimal chronic peribronchial thickening. Slight chronic accentuation of interstitial markings in the mid to lower lungs unchanged since previous exam. No definite acute infiltrate, pleural effusion or pneumothorax. Linear subsegmental atelectasis RIGHT mid lung. No acute osseous findings. IMPRESSION: Enlargement of cardiac silhouette with minimal subsegmental atelectasis in RIGHT mid lung and chronic accentuation of interstitial markings. No definite acute infiltrate. Electronically Signed   By: Lavonia Dana M.D.   On: 01/26/2017 19:10   Dg Chest Port 1 View  Result Date: 01/28/2017 CLINICAL DATA:  57 year old male with shortness breath. On dialysis. Fever. Subsequent encounter. EXAM: PORTABLE CHEST 1 VIEW COMPARISON:  01/26/2017 and 11/11/2016 chest x-ray. FINDINGS: Progressive diffuse airspace disease suggestive of pulmonary  edema. Given patient's history of fever, interstitial infection process not entirely excluded although felt to be less likely consideration. Cardiomegaly. Calcified aorta. No acute osseous abnormality. IMPRESSION: Progressive diffuse airspace disease suggestive of worsening pulmonary edema. Given patient's history of fever, interstitial infection process not entirely excluded although felt to be less likely consideration. Cardiomegaly. Electronically Signed   By: Genia Del M.D.   On: 01/28/2017 07:54   EKG  EKG Interpretation  Date/Time:  Monday January 28 2017 06:32:06 EST Ventricular Rate:  93 PR Interval:    QRS Duration: 82 QT Interval:  399 QTC Calculation: 497 R Axis:   63 Text Interpretation:  Sinus rhythm Nonspecific T abnrm, anterolateral leads Borderline prolonged QT interval When compared with ECG of 01/26/2017,  QT has lengthened Confirmed by Delora Fuel ([id number]) on 01/28/2017 6:38:31 AM       Radiology Dg Chest 2 View  Result Date: 01/26/2017 CLINICAL DATA:  Shortness of breath, end-stage renal disease on dialysis, hypertension, gout EXAM: CHEST  2 VIEW COMPARISON:  11/11/2016 FINDINGS: Enlargement of cardiac silhouette with pulmonary vascular congestion. Mediastinal contours normal. Minimal chronic peribronchial thickening. Slight chronic accentuation of interstitial markings in the mid to lower lungs unchanged since previous exam. No definite acute infiltrate, pleural effusion or pneumothorax. Linear subsegmental atelectasis RIGHT mid lung. No acute osseous findings. IMPRESSION: Enlargement of cardiac silhouette with minimal subsegmental atelectasis in RIGHT mid lung and chronic accentuation of interstitial markings. No definite acute infiltrate. Electronically Signed   By: Lavonia Dana M.D.   On: 01/26/2017 19:10    Procedures Procedures (including critical care time)  Medications Ordered in ED Medications  piperacillin-tazobactam (ZOSYN) IVPB 3.375 g (not administered)    vancomycin (VANCOCIN) IVPB 1000 mg/200 mL premix (not administered)  albuterol (PROVENTIL) (2.5 MG/3ML) 0.083% nebulizer solution 5 mg (not administered)  acetaminophen (TYLENOL) tablet 1,000 mg (not administered)  ipratropium-albuterol (DUONEB) 0.5-2.5 (3) MG/3ML nebulizer solution (3 mLs  Given 01/28/17 0700)  albuterol (PROVENTIL) (2.5 MG/3ML) 0.083% nebulizer solution (2.5 mg  Given 01/28/17 0700)     Initial Impression / Assessment and Plan / ED Course  I have reviewed the triage vital signs and the nursing notes.  Pertinent labs & imaging results that were available during my care of the patient were reviewed by me and considered in my medical decision making (see chart for details).     Code sepsis called based on sirs criteria temperature, respiratory rate.  Source of infection un clear presently though likely respiratory. 8:20 Am feels improved since treatment with nebulized treatment, Tylenol and intravenous antibiotics.  I have consulted Dr. Burnett Sheng nephrology service who will arrange for hemodialysis.  I have also consulted Dr Clementeen Graham from hospitalist service who will  arrange for admission to stepdown unit.  Anemia is chronic Final Clinical Impressions(s) / ED Diagnoses  Dx #1 acute respiratory failure  #2 febrile illness Final diagnoses:  None   #3 anemia ED Discharge Orders    None    CRITICAL CARE Performed by: Orlie Dakin Total critical care time: 40 minutes Critical care time was exclusive of separately billable procedures and treating other patients. Critical care was necessary to treat or prevent imminent or life-threatening deterioration. Critical care was time spent personally by me on the following activities: development of treatment plan with patient and/or surrogate as well as nursing, discussions with consultants, evaluation of patient's response to treatment, examination of patient, obtaining history from patient or surrogate, ordering and performing  treatments and interventions, ordering and review of laboratory studies, ordering and review of radiographic studies, pulse oximetry and re-evaluation of patient's condition.   Orlie Dakin, MD 01/28/17 [phone]

## 2017-01-28 NOTE — Consult Note (Signed)
Reason for Consult: End-stage renal disease Referring Physician: Dr. Carlena Hurl is an 57 y.o. male.  HPI: He is a patient who has history of COPD, hypertension, end-stage renal disease on maintenance hemodialysis presently came with complaints of difficulty breathing, fever, chills and nonproductive cough for the last couple of days.  Initially patient was seen about 2 days ago at [facility] emergency room and he was told he had possible flulike symptoms and discharged home to be followed as an outpatient.  Presently when he went to the dialysis unit patient was found to have febrile and hence he was sent to the emergency room where he was admitted to the hospital.  Presently patient denies any nausea or vomiting.  Still has some difficulty breathing and also wheezing.  Past Medical History:  Diagnosis Date  . Acute edema of lung, unspecified   . Acute, but ill-defined, cerebrovascular disease   . Allergy   . Anemia   . 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)   . Anxiety   . Asthma   . Carpal tunnel syndrome   . Cellulitis and abscess of trunk   . Cholelithiasis 07/13/2014  . Chronic headaches   . Debility, unspecified   . Dermatophytosis of the body   . Dysrhythmia    history of  . Edema   . End stage renal disease on dialysis [facility])    "MWF; [facility]" (10/21/2014)  . Essential hypertension, benign   . GERD (gastroesophageal reflux disease)   . Gout, unspecified   . HTN (hypertension)   . Hypertrophy of prostate without urinary obstruction and other lower urinary tract symptoms (LUTS)   . Hypotension, unspecified   . Impotence of organic origin   . Insomnia, unspecified   . Kidney replaced by transplant   . Localization-related (focal) (partial) epilepsy and epileptic syndromes with complex partial seizures, without mention of intractable epilepsy   . Lumbago   . Memory loss   . OSA on CPAP   . Other and unspecified hyperlipidemia   . Other  chronic nonalcoholic liver disease   . Other malaise and fatigue   . Other nonspecific abnormal serum enzyme levels   . Pain in joint, lower leg   . Pain in joint, upper arm   . Pneumonia "several times"  . Renal dialysis status(V45.11) 02/05/2010   restarted 01/02/13 ofter renal trransplant failure  . Secondary hyperparathyroidism (of renal origin)   . Shortness of breath   . Sleep apnea   . Tension headache   . Unspecified constipation   . Unspecified essential hypertension   . Unspecified hereditary and idiopathic peripheral neuropathy   . Unspecified vitamin D deficiency     Past Surgical History:  Procedure Laterality Date  . AV FISTULA PLACEMENT Left ?2010   "forearm; at [facility]"  . BACK SURGERY    . CARDIAC CATHETERIZATION  03/21/2011  . CHOLECYSTECTOMY N/A 10/21/2014   Procedure: LAPAROSCOPIC CHOLECYSTECTOMY WITH INTRAOPERATIVE CHOLANGIOGRAM;  Surgeon: Autumn Messing III, MD;  Location: [place];  Service: General;  Laterality: N/A;  . COLONOSCOPY    . 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 KIDNEY TRANSPLANT  08/17/2011   [place]  10/21/2014   w/IOC  . LEFT HEART CATHETERIZATION WITH CORONARY ANGIOGRAM N/A 03/21/2011   Procedure: LEFT HEART CATHETERIZATION WITH CORONARY ANGIOGRAM;  Surgeon: [name], MD;  Location: [facility] CATH LAB;  Service: Cardiovascular;  Laterality: N/A;  .  NEPHRECTOMY  08/2013   removed transplaned kidney  . POSTERIOR FUSION CERVICAL SPINE  06/25/2012   for spinal stenosis  . VASECTOMY  2010    Family History  Adopted: Yes  Problem Relation Age of Onset  . Colon cancer Neg Hx   . Esophageal cancer Neg Hx   . Rectal cancer Neg Hx   . Stomach cancer Neg Hx     Social History:  reports that he has been smoking e-cigarettes.  He has a 16.00 pack-year smoking history. he has never used smokeless tobacco. He reports that he does not drink alcohol or use drugs.  Allergies:  Allergies   Allergen Reactions  . Codeine Nausea And Vomiting and Nausea Only    Altered mental status    Medications: I have reviewed the patient's current medications.  Results for orders placed or performed during the hospital encounter of 01/28/17 (from the past 48 hour(s))  Comprehensive metabolic panel     Status: Abnormal   Collection Time: 01/28/17  7:10 AM  Result Value Ref Range   Sodium 135 135 - 145 mmol/L   Potassium 4.7 3.5 - 5.1 mmol/L   Chloride 97 (L) 101 - 111 mmol/L   CO2 18 (L) 22 - 32 mmol/L   Glucose, Bld 113 (H) 65 - 99 mg/dL   BUN 77 (H) 6 - 20 mg/dL   Creatinine, Ser 18.05 (H) 0.61 - 1.24 mg/dL   Calcium 8.4 (L) 8.9 - 10.3 mg/dL   Total Protein 7.4 6.5 - 8.1 g/dL   Albumin 3.7 3.5 - 5.0 g/dL   AST 35 15 - 41 U/L   ALT 31 17 - 63 U/L   Alkaline Phosphatase 75 38 - 126 U/L   Total Bilirubin 1.3 (H) 0.3 - 1.2 mg/dL   GFR calc non Af Amer 3 (L) >60 mL/min   GFR calc Af Amer 3 (L) >60 mL/min    Comment: (NOTE) The eGFR has been calculated using the CKD EPI equation. This calculation has not been validated in all clinical situations. eGFR's persistently <60 mL/min signify possible Chronic Kidney Disease.    Anion gap 20 (H) 5 - 15  CBC WITH DIFFERENTIAL     Status: Abnormal   Collection Time: 01/28/17  7:10 AM  Result Value Ref Range   WBC 4.4 4.0 - 10.5 K/uL   RBC 3.36 (L) 4.22 - 5.81 MIL/uL   Hemoglobin 10.3 (L) 13.0 - 17.0 g/dL   HCT 32.4 (L) 39.0 - 52.0 %   MCV 96.4 78.0 - 100.0 fL   MCH 30.7 26.0 - 34.0 pg   MCHC 31.8 30.0 - 36.0 g/dL   RDW 14.5 11.5 - 15.5 %   Platelets 67 (L) 150 - 400 K/uL    Comment: SPECIMEN CHECKED FOR CLOTS PLATELET COUNT CONFIRMED BY SMEAR    Neutrophils Relative % 81 %   Neutro Abs 3.5 1.7 - 7.7 K/uL   Lymphocytes Relative 9 %   Lymphs Abs 0.4 (L) 0.7 - 4.0 K/uL   Monocytes Relative 10 %   Monocytes Absolute 0.4 0.1 - 1.0 K/uL   Eosinophils Relative 0 %   Eosinophils Absolute 0.0 0.0 - 0.7 K/uL   Basophils Relative 0 %    Basophils Absolute 0.0 0.0 - 0.1 K/uL  Blood Culture (routine x 2)     Status: None (Preliminary result)   Collection Time: 01/28/17  7:10 AM  Result Value Ref Range   Specimen Description BLOOD RIGHT ARM    Special Requests  BOTTLES DRAWN AEROBIC AND ANAEROBIC Blood Culture results may not be optimal due to an inadequate volume of blood received in culture bottles   Culture PENDING    Report Status PENDING   Lactic acid, plasma     Status: None   Collection Time: 01/28/17  7:10 AM  Result Value Ref Range   Lactic Acid, Venous 1.1 0.5 - 1.9 mmol/L  Blood Culture (routine x 2)     Status: None (Preliminary result)   Collection Time: 01/28/17  7:17 AM  Result Value Ref Range   Specimen Description BLOOD RIGHT ARM    Special Requests      BOTTLES DRAWN AEROBIC AND ANAEROBIC Blood Culture adequate volume   Culture PENDING    Report Status PENDING   Influenza panel by PCR (type A & B)     Status: Abnormal   Collection Time: 01/28/17  7:18 AM  Result Value Ref Range   Influenza A By PCR POSITIVE (A) NEGATIVE   Influenza B By PCR NEGATIVE NEGATIVE    Comment: (NOTE) The Xpert Xpress Flu assay is intended as an aid in the diagnosis of  influenza and should not be used as a sole basis for treatment.  This  assay is FDA approved for nasopharyngeal swab specimens only. Nasal  washings and aspirates are unacceptable for Xpert Xpress Flu testing.   Lactic acid, plasma     Status: None   Collection Time: 01/28/17  8:06 AM  Result Value Ref Range   Lactic Acid, Venous 1.8 0.5 - 1.9 mmol/L  Blood gas, arterial     Status: Abnormal   Collection Time: 01/28/17  8:31 AM  Result Value Ref Range   FIO2 50.00    Delivery systems BILEVEL POSITIVE AIRWAY PRESSURE    Mode BILEVEL POSITIVE AIRWAY PRESSURE    Inspiratory PAP 10.0    Expiratory PAP 5.0    pH, Arterial 7.359 7.350 - 7.450   pCO2 arterial 35.8 32.0 - 48.0 mmHg   pO2, Arterial 119.0 (H) 83.0 - 108.0 mmHg   Bicarbonate 20.6  20.0 - 28.0 mmol/L   Acid-base deficit 4.9 (H) 0.0 - 2.0 mmol/L   O2 Saturation 97.5 %   Patient temperature 100.6    Collection site RIGHT RADIAL    Drawn by [id number]    Sample type ARTERIAL DRAW    Allens test (pass/fail) PASS PASS    Dg Chest 2 View  Result Date: 01/26/2017 CLINICAL DATA:  Shortness of breath, end-stage renal disease on dialysis, hypertension, gout EXAM: CHEST  2 VIEW COMPARISON:  11/11/2016 FINDINGS: Enlargement of cardiac silhouette with pulmonary vascular congestion. Mediastinal contours normal. Minimal chronic peribronchial thickening. Slight chronic accentuation of interstitial markings in the mid to lower lungs unchanged since previous exam. No definite acute infiltrate, pleural effusion or pneumothorax. Linear subsegmental atelectasis RIGHT mid lung. No acute osseous findings. IMPRESSION: Enlargement of cardiac silhouette with minimal subsegmental atelectasis in RIGHT mid lung and chronic accentuation of interstitial markings. No definite acute infiltrate. Electronically Signed   By: Lavonia Dana M.D.   On: 01/26/2017 19:10   Dg Chest Port 1 View  Result Date: 01/28/2017 CLINICAL DATA:  57 year old male with shortness breath. On dialysis. Fever. Subsequent encounter. EXAM: PORTABLE CHEST 1 VIEW COMPARISON:  01/26/2017 and 11/11/2016 chest x-ray. FINDINGS: Progressive diffuse airspace disease suggestive of pulmonary edema. Given patient's history of fever, interstitial infection process not entirely excluded although felt to be less likely consideration. Cardiomegaly. Calcified aorta. No acute osseous abnormality. IMPRESSION: Progressive diffuse  airspace disease suggestive of worsening pulmonary edema. Given patient's history of fever, interstitial infection process not entirely excluded although felt to be less likely consideration. Cardiomegaly. Electronically Signed   By: Genia Del M.D.   On: 01/28/2017 07:54    Review of Systems  Constitutional: Positive for chills  and fever.  Respiratory: Positive for cough, shortness of breath and wheezing. Negative for sputum production.   Cardiovascular: Negative for chest pain and orthopnea.  Gastrointestinal: Negative for nausea and vomiting.  Musculoskeletal: Positive for myalgias.  Neurological: Positive for weakness.   Blood pressure (!) 177/81, pulse 96, temperature (!) 100.6 F (38.1 C), temperature source Oral, resp. rate (!) 32, height '5\' 7"'$  (1.702 m), weight 90.4 kg (199 lb 3.2 oz), SpO2 93 %. Physical Exam  Constitutional: He is oriented to person, place, and time. No distress.  Eyes: No scleral icterus.  Neck: No JVD present.  Cardiovascular: Normal rate and regular rhythm.  Respiratory: No respiratory distress. He has wheezes. He has rales.  GI: He exhibits no distension. There is no tenderness.  Neurological: He is alert and oriented to person, place, and time.    Assessment/Plan: 1] difficulty in breathing, cough with fever.  This could be from a combination of COPD/sleep apnea ,exacerbation of his COPD and influenza A infection..  Patient started on antibiotics.  Patient also might have fluid overload. 2] end-stage renal disease: He is status post hemodialysis on Friday.  Presently he is due for dialysis today 3] hypertension: His blood pressure is high this morning but improving. 4] anemia: His hemoglobin is within our target goal. 5] history of sleep apnea 6] diabetes 7] bone and mineral disorder 8] history of sleep apnea Plan: We will make arrangements for patient to get dialysis today for 4 hours 2] will remove about 3 L if systolic blood pressure remains above 90 3] we will check his CBC and renal panel in the morning  [facility] S 01/28/2017, 10:33 AM

## 2017-01-28 NOTE — H&P (Signed)
TRH H&P   Patient Demographics:    Kirk [name], is a 57 y.o. male  MRN: [id number]   DOB - Oct 03, 1960  Admit Date - 01/28/2017  Outpatient Primary MD for the patient is Gayland Curry, DO  Referring MD: Dr. Cathleen Fears  Outpatient Specialists:  Nephrology (Dr Lowanda Foster)  Patient coming from: Home  Chief Complaint  Patient presents with  . Shortness of Breath      HPI:    Kirk [name]  is a 57 y.o. male, with end-stage renal disease on dialysis (M, W, F), anemia of chronic disease, asthma, diabetes mellitus (controlled), GERD, hypertension, OSA on CPAP who was sent from dialysis with fever of 103F. Patient was having fever with shortness of breath, nonpertinent cough, wheezing and body aches of 3 days duration. Associated with nausea but no vomiting. Patient went to the ED 2 days back and was given supportive care for flulike symptoms and discharged home. However symptoms do not improve and was having persistent fever at home with chills. Patient reports chest tightness and some headache with generalized body aches. Denies any diarrhea or dysuria (make some urine). Denies any sick contact or recent illness. Denies any recent travel.  Course in the ED Patient was septic with fever of 100.60 Fahrenheit, tachypneic, hypertensive and hypoxic requiring BiPAP. Sepsis pathway initiated in the ED. Blood will showed normal WBC, hemoglobin of 10.3, platelets of 67, sodium 135, potassium 4.7, BUN of 77 and creatinine of 18 . Chest x-ray showed diffuse airspace disease suggestive of worsening pulmonary edema cannot rule out underlying infection. Lactic acid was normal. Blood cultures and flu PCR sent from the ED and patient started on empiric vancomycin and Zosyn for sepsis. Hospitalist consulted for admission to ICU.     Review of systems:    In addition to the HPI above, (positive  symptoms in bold) Fever+++, chills++ Chronic headache,, No changes with Vision or hearing, No problems swallowing food or Liquids, No Chest pain, Cough and Shortness of Breath, Nausea, No Abdominal pain, No Vommitting, Bowel movements are regular, No Blood in stool or Urine, No dysuria, No new skin rashes or bruises, Generalized body aches No new weakness, tingling, numbness in any extremity, No recent weight gain or loss, No polyuria, polydypsia or polyphagia, No significant Mental Stressors.   With Past History of the following :    Past Medical History:  Diagnosis Date  . Acute edema of lung, unspecified   . Acute, but ill-defined, cerebrovascular disease   . Allergy   . Anemia   . 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)   . Anxiety   . Asthma   . Carpal tunnel syndrome   . Cellulitis and abscess of trunk   . Cholelithiasis 07/13/2014  . Chronic headaches   . Debility, unspecified   . Dermatophytosis of the body   . Dysrhythmia    history  of  . Edema   . End stage renal disease on dialysis [facility])    "MWF; [facility] in [facility]" (10/21/2014)  . Essential hypertension, benign   . GERD (gastroesophageal reflux disease)   . Gout, unspecified   . HTN (hypertension)   . Hypertrophy of prostate without urinary obstruction and other lower urinary tract symptoms (LUTS)   . Hypotension, unspecified   . Impotence of organic origin   . Insomnia, unspecified   . Kidney replaced by transplant   . Localization-related (focal) (partial) epilepsy and epileptic syndromes with complex partial seizures, without mention of intractable epilepsy   . Lumbago   . Memory loss   . OSA on CPAP   . Other and unspecified hyperlipidemia   . Other chronic nonalcoholic liver disease   . Other malaise and fatigue   . Other nonspecific abnormal serum enzyme levels   . Pain in joint, lower leg   . Pain in joint, upper arm   . Pneumonia "several times"  . Renal dialysis status(V45.11)  02/05/2010   restarted 01/02/13 ofter renal trransplant failure  . Secondary hyperparathyroidism (of renal origin)   . Shortness of breath   . Sleep apnea   . Tension headache   . Unspecified constipation   . Unspecified essential hypertension   . Unspecified hereditary and idiopathic peripheral neuropathy   . Unspecified vitamin D deficiency       Past Surgical History:  Procedure Laterality Date  . AV FISTULA PLACEMENT Left ?2010   "forearm; at [facility]"  . BACK SURGERY    . CARDIAC CATHETERIZATION  03/21/2011  . CHOLECYSTECTOMY N/A 10/21/2014   Procedure: LAPAROSCOPIC CHOLECYSTECTOMY WITH INTRAOPERATIVE CHOLANGIOGRAM;  Surgeon: Autumn Messing III, MD;  Location: [facility];  Service: General;  Laterality: N/A;  . COLONOSCOPY    . 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 KIDNEY TRANSPLANT  08/17/2011   [place]  10/21/2014   w/IOC  . LEFT HEART CATHETERIZATION WITH CORONARY ANGIOGRAM N/A 03/21/2011   Procedure: LEFT HEART CATHETERIZATION WITH CORONARY ANGIOGRAM;  Surgeon: [name], MD;  Location: [facility] CATH LAB;  Service: Cardiovascular;  Laterality: N/A;  . NEPHRECTOMY  08/2013   removed transplaned kidney  . POSTERIOR FUSION CERVICAL SPINE  06/25/2012   for spinal stenosis  . VASECTOMY  2010      Social History:     Social History   Tobacco Use  . Smoking status: Current Every Day Smoker    Packs/day: 0.50    Years: 32.00    Pack years: 16.00    Types: E-cigarettes    Last attempt to quit: 08/23/2014    Years since quitting: 2.4  . Smokeless tobacco: Never Used  Substance Use Topics  . Alcohol use: No    Alcohol/week: 0.0 oz     Lives - home with wife  Mobility - independent     Family History :     Family History  Adopted: Yes  Problem Relation Age of Onset  . Colon cancer Neg Hx   . Esophageal cancer Neg Hx   . Rectal cancer Neg Hx   . Stomach cancer Neg Hx       Home Medications:    Prior to Admission medications   Medication Sig Start Date End Date Taking? Authorizing Provider  acetaminophen (TYLENOL) 500 MG tablet Take 1 tablet (500 mg total) by mouth every 6 (six) hours as needed for moderate pain. 09/12/14  Samuella Cota, MD  amLODipine (NORVASC) 10 MG tablet TAKE 1 TABLET DAILY 01/03/17   Reed, Tiffany L, DO  aspirin EC 81 MG tablet Take 81 mg by mouth daily.    [provider]  b complex-vitamin c-folic acid (NEPHRO-VITE) 0.8 MG TABS tablet TAKE 1 TABLET BY MOUTH EVERY DAY 12/03/16   Reed, Tiffany L, DO  budesonide-formoterol (SYMBICORT) 80-4.5 MCG/ACT inhaler Inhale 2 puffs into the lungs 2 (two) times daily. 09/21/14   Bonnielee Haff, MD  calcitRIOL (ROCALTROL) 0.25 MCG capsule Take 1 capsule (0.25 mcg total) by mouth every Monday, Wednesday, and Friday with hemodialysis. 09/21/14   Bonnielee Haff, MD  Cholecalciferol (VITAMIN D3) 5000 UNITS TABS Take 1 tablet by mouth daily.    [provider]  cinacalcet (SENSIPAR) 30 MG tablet Take 30 mg by mouth daily. With dinner.    [provider]  clonazePAM (KLONOPIN) 0.5 MG tablet Take 1 tablet (0.5 mg total) by mouth 2 (two) times daily. Patient taking differently: Take 0.5 mg by mouth 2 (two) times daily as needed.  09/22/16   Orvan Falconer, MD  diphenhydrAMINE (BENADRYL) 25 MG tablet Take 25 mg by mouth at bedtime as needed for allergies.    [provider]  guaiFENesin (MUCINEX) 600 MG 12 hr tablet Take 600 mg by mouth 2 (two) times daily.    [provider]  guaiFENesin-dextromethorphan (ROBITUSSIN DM) 100-10 MG/5ML syrup Take 10 mLs by mouth every 4 (four) hours as needed for cough.    [provider]  hydrALAZINE (APRESOLINE) 50 MG tablet Take 1 tablet (50 mg total) by mouth every 8 (eight) hours. Patient not taking: Reported on 11/23/2016 09/22/16   Orvan Falconer, MD  metoprolol tartrate (LOPRESSOR) 50 MG tablet Take One and a half tablet by mouth twice daily 10/22/16    Reed, Tiffany L, DO  omeprazole (PRILOSEC) 20 MG capsule TAKE 1 CAPSULE BY MOUTH 2 TIMES DAILY BEFORE A MEAL. 12/26/16   Milus Banister, MD  omeprazole (PRILOSEC) 20 MG capsule TAKE 1 CAPSULE BY MOUTH 2 TIMES DAILY BEFORE A MEAL. 01/17/17   Milus Banister, MD  ondansetron (ZOFRAN ODT) 4 MG disintegrating tablet Take 1 tablet (4 mg total) by mouth every 8 (eight) hours as needed for nausea or vomiting. 10/26/16   Kinnie Feil, PA-C  pravastatin (PRAVACHOL) 40 MG tablet TAKE 1 TABLET EVERY EVENING 01/03/17   Reed, Tiffany L, DO  sevelamer carbonate (RENVELA) 800 MG tablet Take 3 tablets (2,400 mg total) by mouth 3 (three) times daily with meals. 09/22/16   Orvan Falconer, MD  traMADol (ULTRAM) 50 MG tablet Take 1 tablet (50 mg total) by mouth daily as needed (severe headache). 10/08/16   Reed, Tiffany L, DO     Allergies:     Allergies  Allergen Reactions  . Codeine Nausea And Vomiting and Nausea Only    Altered mental status     Physical Exam:   Vitals  Blood pressure (!) 177/81, pulse 96, temperature (!) 100.6 F (38.1 C), temperature source Oral, resp. rate (!) 32, height 5\' 7"  (1.702 m), weight 90.4 kg (199 lb 3.2 oz), SpO2 93 %.   General: Middle aged male on BiPAP, in some distress HEENT: Pupils reactive bilaterally, EOMI, no JVD, no pallor, no icterus, moist mucosa, supple neck, no cervical lymphadenopathy   chest: Diffuse wheezing bilaterally, no crackles CVS: Normal S1 and S2, no murmurs rub or gallop GI: Soft, nondistended, nontender, bowel sounds present Musculoskeletal: Warm, no edema CNS:  Alert and oriented   Data Review:    CBC Recent Labs  Lab 01/26/17 1810 01/28/17 0710  WBC 3.4* 4.4  HGB 10.6* 10.3*  HCT 33.5* 32.4*  PLT 89* 67*  MCV 96.3 96.4  MCH 30.5 30.7  MCHC 31.6 31.8  RDW 14.4 14.5  LYMPHSABS 0.5* 0.4*  MONOABS 0.2 0.4  EOSABS 0.0 0.0  BASOSABS 0.0 0.0    ------------------------------------------------------------------------------------------------------------------  Chemistries  Recent Labs  Lab 01/26/17 1810 01/28/17 0710  NA 133* 135  K 3.6 4.7  CL 96* 97*  CO2 23 18*  GLUCOSE 121* 113*  BUN 48* 77*  CREATININE 13.22* 18.05*  CALCIUM 8.0* 8.4*  AST 27 35  ALT 19 31  ALKPHOS 73 75  BILITOT 1.1 1.3*   ------------------------------------------------------------------------------------------------------------------ estimated creatinine clearance is 5 mL/min (A) (by C-G formula based on SCr of 18.05 mg/dL (H)). ------------------------------------------------------------------------------------------------------------------ No results for input(s): TSH, T4TOTAL, T3FREE, THYROIDAB in the last 72 hours.  Invalid input(s): FREET3  Coagulation profile Recent Labs  Lab 01/26/17 1810  INR 1.23   ------------------------------------------------------------------------------------------------------------------- No results for input(s): DDIMER in the last 72 hours. -------------------------------------------------------------------------------------------------------------------  Cardiac Enzymes No results for input(s): CKMB, TROPONINI, MYOGLOBIN in the last 168 hours.  Invalid input(s): CK ------------------------------------------------------------------------------------------------------------------    Component Value Date/Time   BNP 517.0 (H) 09/12/2016 2338     ---------------------------------------------------------------------------------------------------------------  Urinalysis    Component Value Date/Time   COLORURINE YELLOW 11/06/2016 0814   APPEARANCEUR CLOUDY (A) 11/06/2016 0814   LABSPEC 1.015 11/06/2016 0814   PHURINE 6.0 11/06/2016 0814   GLUCOSEU NEGATIVE 11/06/2016 0814   HGBUR SMALL (A) 11/06/2016 0814   BILIRUBINUR NEGATIVE 11/06/2016 0814   KETONESUR NEGATIVE 11/06/2016 0814   PROTEINUR  >=300 (A) 11/06/2016 0814   UROBILINOGEN 0.2 04/06/2014 1030   NITRITE NEGATIVE 11/06/2016 0814   LEUKOCYTESUR SMALL (A) 11/06/2016 0814    ----------------------------------------------------------------------------------------------------------------   Imaging Results:    Dg Chest 2 View  Result Date: 01/26/2017 CLINICAL DATA:  Shortness of breath, end-stage renal disease on dialysis, hypertension, gout EXAM: CHEST  2 VIEW COMPARISON:  11/11/2016 FINDINGS: Enlargement of cardiac silhouette with pulmonary vascular congestion. Mediastinal contours normal. Minimal chronic peribronchial thickening. Slight chronic accentuation of interstitial markings in the mid to lower lungs unchanged since previous exam. No definite acute infiltrate, pleural effusion or pneumothorax. Linear subsegmental atelectasis RIGHT mid lung. No acute osseous findings. IMPRESSION: Enlargement of cardiac silhouette with minimal subsegmental atelectasis in RIGHT mid lung and chronic accentuation of interstitial markings. No definite acute infiltrate. Electronically Signed   By: Lavonia Dana M.D.   On: 01/26/2017 19:10   Dg Chest Port 1 View  Result Date: 01/28/2017 CLINICAL DATA:  57 year old male with shortness breath. On dialysis. Fever. Subsequent encounter. EXAM: PORTABLE CHEST 1 VIEW COMPARISON:  01/26/2017 and 11/11/2016 chest x-ray. FINDINGS: Progressive diffuse airspace disease suggestive of pulmonary edema. Given patient's history of fever, interstitial infection process not entirely excluded although felt to be less likely consideration. Cardiomegaly. Calcified aorta. No acute osseous abnormality. IMPRESSION: Progressive diffuse airspace disease suggestive of worsening pulmonary edema. Given patient's history of fever, interstitial infection process not entirely excluded although felt to be less likely consideration. Cardiomegaly. Electronically Signed   By: Genia Del M.D.   On: 01/28/2017 07:54    My personal review  of EKG: Sinus rhythm at 93 with mildly prolonged QTC of 497 no ST-T changes   Assessment & Plan:    Principal Problem:   Sepsis ([place]) Secondary to influenza A. Order Tamiflu 30  mg 1 and then another dose after dialysis. Follow blood cultures. Continue empiric vancomycin and Zosyn until blood culture available. Admitted to ICU on BiPAP but was easily transitioned to 3 L via nasal cannula by the time he arrived there.  Active Problems:   Acute respiratory failure with hypoxia (HCC) Secondary to influenza A with pulmonary edema.. Cannot rule out infiltrate. Now transitioned off BiPAP to nasal cannula. Also has significant wheezing with underlying asthma/COPD. Will place on IV Solu-Medrol twice a day, scheduled nebs and antitussives. As needed Tylenol. Monitor respiratory status after dialysis.  ? Healthcare associated pneumonia Cannot rule out infiltrate entirely on chest x-ray. Continue empiric vancomycin and Zosyn. Repeat chest x-ray in a.m. after dialysis.    Thrombocytopenia (HCC) Has chronic thrombocytopenia with baseline platelets in low 100 and is significantly lower today. Possibly due to sepsis. Monitor for now.    ESRD on dialysis [facility]) Scheduled hemodialysis today. Nephrology consult appreciated. Resume home medications  Essential hypertension Blood pressure elevated in the ED resume home medications.    OSA on CPAP Continue nighttime CPAP    DM (diabetes mellitus), type 2 with renal complications (HCC) Monitor on sliding scale coverage  Tobacco abuse Counseled on cessation.     DVT Prophylaxis  SCDs  AM Labs Ordered, also please review Full Orders  Family Communication: Admission, patients condition and plan of care including tests being ordered have been discussed with the patient and his wife at bedside  Code Status full code  Likely DC to  home in next 48 hours if improved  Condition GUARDED    Consults called: Nephrology  Admission status:  Inpatient  Time spent in minutes : 69   Gearldene Fiorenza M.D on 01/28/2017 at 8:59 AM  Between 7am to 7pm - Pager - [phone]. After 7pm go to [email or web address] - password [facility]  [facility] - Office  [phone]

## 2017-01-28 NOTE — ED Notes (Signed)
While this RN was placing an IV in the pt, pt seemed very restless, struggling to breath, and at one point it seemed he was going to quit breathing. Respiratory was down in the ED and came and placed the pt on bi-pap.

## 2017-01-28 NOTE — Progress Notes (Signed)
Patient has been taking BiPAP and oxygen  on and off at will without RN or RT assistance. These therapies will not be beneficial to  Kirk [name]  If he does not use them appropriately.

## 2017-01-28 NOTE — ED Triage Notes (Signed)
Pt brought in by [facility] for c/o sob; pt was at dialysis and pt was running a fever of 103; pt took 1 gm tylenol just pta; pt having some chest pain and was seen [facility] ago for chest pain and was sent home, all tests came back negative

## 2017-01-29 ENCOUNTER — Inpatient Hospital Stay (HOSPITAL_COMMUNITY): Payer: Medicare Other

## 2017-01-29 DIAGNOSIS — J181 Lobar pneumonia, unspecified organism: Secondary | ICD-10-CM

## 2017-01-29 DIAGNOSIS — J101 Influenza due to other identified influenza virus with other respiratory manifestations: Secondary | ICD-10-CM

## 2017-01-29 LAB — GLUCOSE, CAPILLARY
GLUCOSE-CAPILLARY: 192 mg/dL — AB (ref 65–99)
GLUCOSE-CAPILLARY: 150 mg/dL — AB (ref 65–99)
GLUCOSE-CAPILLARY: 182 mg/dL — AB (ref 65–99)
Glucose-Capillary: 138 mg/dL — ABNORMAL HIGH (ref 65–99)

## 2017-01-29 LAB — CBC
HCT: 31.6 % — ABNORMAL LOW (ref 39.0–52.0)
HEMOGLOBIN: 10.3 g/dL — AB (ref 13.0–17.0)
MCH: 30.9 pg (ref 26.0–34.0)
MCHC: 32.6 g/dL (ref 30.0–36.0)
MCV: 94.9 fL (ref 78.0–100.0)
Platelets: 74 10*3/uL — ABNORMAL LOW (ref 150–400)
RBC: 3.33 MIL/uL — ABNORMAL LOW (ref 4.22–5.81)
RDW: 14 % (ref 11.5–15.5)
WBC: 2.5 10*3/uL — AB (ref 4.0–10.5)

## 2017-01-29 LAB — URINALYSIS, ROUTINE W REFLEX MICROSCOPIC
BACTERIA UA: NONE SEEN
BILIRUBIN URINE: NEGATIVE
Glucose, UA: NEGATIVE mg/dL
KETONES UR: NEGATIVE mg/dL
LEUKOCYTES UA: NEGATIVE
Nitrite: NEGATIVE
PROTEIN: 100 mg/dL — AB
Specific Gravity, Urine: 1.014 (ref 1.005–1.030)
pH: 6 (ref 5.0–8.0)

## 2017-01-29 LAB — HIV ANTIBODY (ROUTINE TESTING W REFLEX): HIV Screen 4th Generation wRfx: NONREACTIVE

## 2017-01-29 LAB — RENAL FUNCTION PANEL
ALBUMIN: 3.4 g/dL — AB (ref 3.5–5.0)
Anion gap: 17 — ABNORMAL HIGH (ref 5–15)
BUN: 58 mg/dL — AB (ref 6–20)
CALCIUM: 9.1 mg/dL (ref 8.9–10.3)
CO2: 24 mmol/L (ref 22–32)
Chloride: 96 mmol/L — ABNORMAL LOW (ref 101–111)
Creatinine, Ser: 13.8 mg/dL — ABNORMAL HIGH (ref 0.61–1.24)
GFR calc Af Amer: 4 mL/min — ABNORMAL LOW (ref >60)
GFR, EST NON AFRICAN AMERICAN: 3 mL/min — AB (ref >60)
GLUCOSE: 168 mg/dL — AB (ref 65–99)
PHOSPHORUS: 7.1 mg/dL — AB (ref 2.5–4.6)
Potassium: 4.6 mmol/L (ref 3.5–5.1)
SODIUM: 137 mmol/L (ref 135–145)

## 2017-01-29 NOTE — Care Management Note (Addendum)
Case Management Note  Patient Details  Name: Kirk [name] MRN: [id number] Date of Birth: [birth year]  Subjective/Objective:      Sepsis, Secondary to influenza A and? HCP. Pt is from home, lives with wife and daughter. Pt is ind with ADL's. He has PCP, drives himself to appointments, has insurance with drug coverage. He has CPAP and neb machine, no home oxygen. Pt gets HD at [facility] in [place] three days a week.      Action/Plan: DC home with self care. Potential DC tomorrow. Currently on room air.   Expected Discharge Date:    01/30/2017 Expected Discharge Plan:  Home/Self Care  In-House Referral:     Discharge planning Services  CM Consult  Post Acute Care Choice:    Choice offered to:     DME Arranged:    DME Agency:     HH Arranged:    HH Agency:     Status of Service:  In process, will continue to follow  If discussed at Long Length of Stay Meetings, dates discussed:    Additional Comments:  Amirah Goerke, Chauncey Reading, RN 01/29/2017, 11:33 AM

## 2017-01-29 NOTE — Progress Notes (Signed)
Patient transferred to medical surgical unit in wheelchair.

## 2017-01-29 NOTE — Progress Notes (Signed)
Subjective: Interval History: has no complaint of difficulty in breathing.  He has occasional cough but no sputum production.  He states that he is feeling much better..  Objective: Vital signs in last 24 hours: Temp:  [98.2 F (36.8 C)-99.4 F (37.4 C)] 98.7 F (37.1 C) (01/08 0735) Pulse Rate:  [65-92] 72 (01/08 0600) Resp:  [17-34] 23 (01/08 0600) BP: (142-186)/(48-88) 144/57 (01/08 0600) SpO2:  [87 %-100 %] 89 % (01/08 0600) Weight:  [99 kg (218 lb 4.1 oz)-99.9 kg (220 lb 3.8 oz)] 99 kg (218 lb 4.1 oz) (01/08 0500) Weight change: 9.543 kg (21 lb 0.6 oz)  Intake/Output from previous day: 01/07 0701 - 01/08 0700 In: 300 [P.O.:250; IV Piggyback:50] Out: 3500  Intake/Output this shift: No intake/output data recorded.  General appearance: alert, cooperative and no distress Resp: diminished breath sounds posterior - bilateral Cardio: regular rate and rhythm Extremities: No edema  Lab Results: Recent Labs    01/28/17 0710 01/29/17 0408  WBC 4.4 2.5*  HGB 10.3* 10.3*  HCT 32.4* 31.6*  PLT 67* 74*   BMET:  Recent Labs    01/28/17 0710 01/29/17 0408  NA 135 137  K 4.7 4.6  CL 97* 96*  CO2 18* 24  GLUCOSE 113* 168*  BUN 77* 58*  CREATININE 18.05* 13.80*  CALCIUM 8.4* 9.1   No results for input(s): PTH in the last 72 hours. Iron Studies: No results for input(s): IRON, TIBC, TRANSFERRIN, FERRITIN in the last 72 hours.  Studies/Results: Dg Chest Port 1 View  Result Date: 01/28/2017 CLINICAL DATA:  57 year old male with shortness breath. On dialysis. Fever. Subsequent encounter. EXAM: PORTABLE CHEST 1 VIEW COMPARISON:  01/26/2017 and 11/11/2016 chest x-ray. FINDINGS: Progressive diffuse airspace disease suggestive of pulmonary edema. Given patient's history of fever, interstitial infection process not entirely excluded although felt to be less likely consideration. Cardiomegaly. Calcified aorta. No acute osseous abnormality. IMPRESSION: Progressive diffuse airspace  disease suggestive of worsening pulmonary edema. Given patient's history of fever, interstitial infection process not entirely excluded although felt to be less likely consideration. Cardiomegaly. Electronically Signed   By: Genia Del M.D.   On: 01/28/2017 07:54    I have reviewed the patient's current medications.  Assessment/Plan: 1] difficulty in breathing and cough.  Possibly multifactorial including COPD/sleep apnea/fluid overload.  Patient was dialyzed and 3-1/2 L was removed yesterday and presently is feeling much better. 2] bone and mineral disorder: His calcium is range but phosphorus is high.  Presently he is on Renvela. 3] anemia: His hemoglobin 10.3 is within our target goal 4] hypertension: His blood pressure is reasonably controlled 5] low CO2: Possibly metabolic and has improved.   6] diabetes Plan: Patient does not require dialysis today and will make arrangement for him to get dialysis tomorrow which is his regular schedule. 2] we will check his CBC and renal panel in the morning 3] we will try to remove another 3 L tomorrow if his blood pressure tolerates.   LOS: 1 day   Azelie Noguera S 01/29/2017,8:10 AM

## 2017-01-29 NOTE — Progress Notes (Signed)
Checked on patient to see if he wanted to put his BIPAP machine on he stated "no not ready for bed yet" pt has no o2 on spo2 94%. Passed on from report that patient has been removing o2 and BIPAP whenever he pleases. RT will continue to monitor

## 2017-01-29 NOTE — Progress Notes (Addendum)
PROGRESS NOTE                                                                                                                                                                                                             Patient Demographics:    Kirk [name], is a 57 y.o. male, DOB - 06-08-1960, KWI:[id number]  Admit date - 01/28/2017   Admitting Physician Louellen Molder, MD  Outpatient Primary MD for the patient is Gayland Curry, DO  LOS - 1  Outpatient Specialists: Nephrology Vascular surgery  Chief Complaint  Patient presents with  . Shortness of Breath       Brief Narrative   57 year old male on ESRD on hemodialysis (M, W, F), asthma, diabetes mellitus, GERD, anemia of chronic disease, hypertension, OSA on CPAP presented with sepsis with flulike symptoms for past 3 days. Patient septic in the ED with acute hypoxic respiratory failure and started on broad-spectrum antibiotics along with BiPAP and found to have positive influenza A.   Subjective:   Stable on room air today. Afebrile. Received hemodialysis after admission yesterday. Reports small amount of bleeding from his AV fistula site.   Assessment  & Plan :    Principal Problem:   Sepsis ([place]) Secondary to influenza A and? HCP. Received Tamiflu after dialysis yesterday. Next dose of Tamiflu will be during next hemodialysis. Continue empiric vancomycin and Zosyn. Follow blood cultures. Sepsis currently resolved. Transfer to medical floor.  Active Problems:  Active Problems:   Acute respiratory failure with hypoxia (HCC) Secondary to influenza A with pulmonary edema.. Follow-up chest x-ray this morning shows significant improvement in the pulmonary edema without infiltrate doubting possible pneumonia.Marland Kitchen Required BiPAP on admission and improved after dialysis and receiving treatments with Tamiflu, antibiotic and nebulizer. Stable on room air at  present. Pneumonia ruled out. Discontinue antibiotics.   Leukopenia Secondary to sepsis. Monitor for now.   Thrombocytopenia (HCC) Has chronic thrombocytopenia with baseline platelets in low 100, father lower due to sepsis.    ESRD on dialysis [facility]) Renal consult appreciated. Received scheduled hemodialysis on 1/7. Resume home medications.  Essential hypertension Hypertensive in the ED. Improved after respiratory status better and following dialysis.    OSA on CPAP Continue nighttime CPAP    DM (diabetes mellitus), type 2 with renal complications (HCC) Stable on sliding scale coverage.  Tobacco abuse  Counseled on cessation.  ? Bleeding from left AV fistula Scant amount of bleeding noted yesterday. H&H stable. Will monitor for now. Patient reports having a fistulogram scheduled within the next 2 weeks.   DVT Prophylaxis  SCDs         Code Status : Full code  Family Communication  : At bedside  Disposition Plan  : Transfer to medical floor. Home possibly tomorrow after dialysis if continues to improve  Barriers For Discharge : Active symptoms  Consults  :  Nephrology  Procedures  : Hemodialysis  DVT Prophylaxis  :  SCDs  Lab Results  Component Value Date   PLT 74 (L) 01/29/2017    Antibiotics  :    Anti-infectives (From admission, onward)   Start     Dose/Rate Route Frequency Ordered Stop   01/28/17 1800  piperacillin-tazobactam (ZOSYN) IVPB 3.375 g     3.375 g 12.5 mL/hr over 240 Minutes Intravenous Every 12 hours 01/28/17 1230     01/28/17 1800  oseltamivir (TAMIFLU) capsule 30 mg     30 mg Oral Once 01/28/17 1736 01/28/17 2109   01/28/17 1530  oseltamivir (TAMIFLU) capsule 30 mg     30 mg Oral Every M-W-F (Hemodialysis) 01/28/17 1515 02/04/17 1159   01/28/17 1500  vancomycin (VANCOCIN) IVPB 1000 mg/200 mL premix     1,000 mg 200 mL/hr over 60 Minutes Intravenous Every M-W-F (Hemodialysis) 01/28/17 1230     01/28/17 1015  oseltamivir  (TAMIFLU) capsule 30 mg  Status:  Discontinued     30 mg Oral  Once 01/28/17 1009 01/28/17 1734   01/28/17 0715  piperacillin-tazobactam (ZOSYN) IVPB 3.375 g     3.375 g 100 mL/hr over 30 Minutes Intravenous  Once 01/28/17 0713 01/28/17 0821   01/28/17 0715  vancomycin (VANCOCIN) IVPB 1000 mg/200 mL premix     1,000 mg 200 mL/hr over 60 Minutes Intravenous  Once 01/28/17 0713 01/28/17 0930        Objective:   Vitals:   01/29/17 0500 01/29/17 0600 01/29/17 0735 01/29/17 1017  BP: (!) 186/79 (!) 144/57    Pulse: 71 72    Resp: (!) 22 (!) 23    Temp:   98.7 F (37.1 C)   TempSrc:   Oral   SpO2: 93% (!) 89%  96%  Weight: 99 kg (218 lb 4.1 oz)     Height:        Wt Readings from Last 3 Encounters:  01/29/17 99 kg (218 lb 4.1 oz)  01/26/17 100.7 kg (222 lb)  01/03/17 102.7 kg (226 lb 6 oz)     Intake/Output Summary (Last 24 hours) at 01/29/2017 1051 Last data filed at 01/28/2017 2200 Gross per 24 hour  Intake 250 ml  Output 3500 ml  Net -3250 ml     Physical Exam  Gen: not in distress HEENT: moist mucosa, supple neck Chest: Improved breath sounds bilaterally, few scattered rhonchi CVS: N S1&S2, no murmurs, rubs or gallop GI: soft, NT, ND, BS+ Musculoskeletal: warm, no edema, left AV fistula with good thrill     Data Review:    CBC Recent Labs  Lab 01/26/17 1810 01/28/17 0710 01/29/17 0408  WBC 3.4* 4.4 2.5*  HGB 10.6* 10.3* 10.3*  HCT 33.5* 32.4* 31.6*  PLT 89* 67* 74*  MCV 96.3 96.4 94.9  MCH 30.5 30.7 30.9  MCHC 31.6 31.8 32.6  RDW 14.4 14.5 14.0  LYMPHSABS 0.5* 0.4*  --   MONOABS 0.2 0.4  --  EOSABS 0.0 0.0  --   BASOSABS 0.0 0.0  --     Chemistries  Recent Labs  Lab 01/26/17 1810 01/28/17 0710 01/29/17 0408  NA 133* 135 137  K 3.6 4.7 4.6  CL 96* 97* 96*  CO2 23 18* 24  GLUCOSE 121* 113* 168*  BUN 48* 77* 58*  CREATININE 13.22* 18.05* 13.80*  CALCIUM 8.0* 8.4* 9.1  AST 27 35  --   ALT 19 31  --   ALKPHOS 73 75  --   BILITOT 1.1  1.3*  --    ------------------------------------------------------------------------------------------------------------------ No results for input(s): CHOL, HDL, LDLCALC, TRIG, CHOLHDL, LDLDIRECT in the last 72 hours.  Lab Results  Component Value Date   HGBA1C 5.4 11/02/2016   ------------------------------------------------------------------------------------------------------------------ No results for input(s): TSH, T4TOTAL, T3FREE, THYROIDAB in the last 72 hours.  Invalid input(s): FREET3 ------------------------------------------------------------------------------------------------------------------ No results for input(s): VITAMINB12, FOLATE, FERRITIN, TIBC, IRON, RETICCTPCT in the last 72 hours.  Coagulation profile Recent Labs  Lab 01/26/17 1810  INR 1.23    No results for input(s): DDIMER in the last 72 hours.  Cardiac Enzymes No results for input(s): CKMB, TROPONINI, MYOGLOBIN in the last 168 hours.  Invalid input(s): CK ------------------------------------------------------------------------------------------------------------------    Component Value Date/Time   BNP 517.0 (H) 09/12/2016 2338    Inpatient Medications  Scheduled Meds: . acetaminophen  1,000 mg Oral Once  . amLODipine  10 mg Oral Daily  . calcitRIOL  0.25 mcg Oral Q M,W,F-HD  . cholecalciferol  5,000 Units Oral Daily  . cinacalcet  30 mg Oral QAC supper  . guaiFENesin  600 mg Oral BID  . hydrALAZINE  50 mg Oral Q8H  . insulin aspart  0-5 Units Subcutaneous QHS  . insulin aspart  0-9 Units Subcutaneous TID WC  . methylPREDNISolone (SOLU-MEDROL) injection  60 mg Intravenous Q12H  . metoprolol tartrate  75 mg Oral BID  . mometasone-formoterol  2 puff Inhalation BID  . multivitamin  1 tablet Oral QHS  . oseltamivir  30 mg Oral Q M,W,F-HD  . pantoprazole  40 mg Oral Daily  . pravastatin  40 mg Oral QPM  . sevelamer carbonate  2,400 mg Oral TID WC   Continuous Infusions: .  piperacillin-tazobactam (ZOSYN)  IV Stopped (01/29/17 0849)  . vancomycin Stopped (01/28/17 2122)   PRN Meds:.acetaminophen **OR** acetaminophen, clonazePAM, diphenhydrAMINE, guaiFENesin-dextromethorphan, ondansetron  Micro Results Recent Results (from the past 240 hour(s))  Blood Culture (routine x 2)     Status: None (Preliminary result)   Collection Time: 01/28/17  7:10 AM  Result Value Ref Range Status   Specimen Description BLOOD RIGHT ARM  Final   Special Requests   Final    BOTTLES DRAWN AEROBIC AND ANAEROBIC Blood Culture results may not be optimal due to an inadequate volume of blood received in culture bottles   Culture NO GROWTH 1 DAY  Final   Report Status PENDING  Incomplete  Blood Culture (routine x 2)     Status: None (Preliminary result)   Collection Time: 01/28/17  7:17 AM  Result Value Ref Range Status   Specimen Description BLOOD RIGHT ARM  Final   Special Requests   Final    BOTTLES DRAWN AEROBIC AND ANAEROBIC Blood Culture adequate volume   Culture NO GROWTH < 24 HOURS  Final   Report Status PENDING  Incomplete  MRSA PCR Screening     Status: None   Collection Time: 01/28/17  9:23 AM  Result Value Ref Range Status   MRSA  by PCR NEGATIVE NEGATIVE Final    Comment:        The GeneXpert MRSA Assay (FDA approved for NASAL specimens only), is one component of a comprehensive MRSA colonization surveillance program. It is not intended to diagnose MRSA infection nor to guide or monitor treatment for MRSA infections.     Radiology Reports Dg Chest 2 View  Result Date: 01/29/2017 CLINICAL DATA:  Pneumonia. EXAM: CHEST  2 VIEW COMPARISON:  Chest x-ray from yesterday. FINDINGS: Stable mild cardiomegaly. Diffuse opacities in both lungs have significantly improved. Mildly increased interstitial markings persist. No pleural effusion or pneumothorax. No acute osseous abnormality. IMPRESSION: 1. Significant improvement in bilateral airspace disease, suggesting resolving  edema given the rapid improvement. Mild residual interstitial edema. Electronically Signed   By: Titus Dubin M.D.   On: 01/29/2017 08:12   Dg Chest 2 View  Result Date: 01/26/2017 CLINICAL DATA:  Shortness of breath, end-stage renal disease on dialysis, hypertension, gout EXAM: CHEST  2 VIEW COMPARISON:  11/11/2016 FINDINGS: Enlargement of cardiac silhouette with pulmonary vascular congestion. Mediastinal contours normal. Minimal chronic peribronchial thickening. Slight chronic accentuation of interstitial markings in the mid to lower lungs unchanged since previous exam. No definite acute infiltrate, pleural effusion or pneumothorax. Linear subsegmental atelectasis RIGHT mid lung. No acute osseous findings. IMPRESSION: Enlargement of cardiac silhouette with minimal subsegmental atelectasis in RIGHT mid lung and chronic accentuation of interstitial markings. No definite acute infiltrate. Electronically Signed   By: Lavonia Dana M.D.   On: 01/26/2017 19:10   Dg Chest Port 1 View  Result Date: 01/28/2017 CLINICAL DATA:  57 year old male with shortness breath. On dialysis. Fever. Subsequent encounter. EXAM: PORTABLE CHEST 1 VIEW COMPARISON:  01/26/2017 and 11/11/2016 chest x-ray. FINDINGS: Progressive diffuse airspace disease suggestive of pulmonary edema. Given patient's history of fever, interstitial infection process not entirely excluded although felt to be less likely consideration. Cardiomegaly. Calcified aorta. No acute osseous abnormality. IMPRESSION: Progressive diffuse airspace disease suggestive of worsening pulmonary edema. Given patient's history of fever, interstitial infection process not entirely excluded although felt to be less likely consideration. Cardiomegaly. Electronically Signed   By: Genia Del M.D.   On: 01/28/2017 07:54    Time Spent in minutes  25   Goebel Hellums M.D on 01/29/2017 at 10:51 AM  Between 7am to 7pm - Pager - [phone]  After 7pm go to [email or web address] -  password [facility]  [facility] -  Office  [phone]

## 2017-01-30 DIAGNOSIS — A413 Sepsis due to Hemophilus influenzae: Secondary | ICD-10-CM

## 2017-01-30 DIAGNOSIS — N186 End stage renal disease: Secondary | ICD-10-CM

## 2017-01-30 DIAGNOSIS — J9601 Acute respiratory failure with hypoxia: Secondary | ICD-10-CM

## 2017-01-30 DIAGNOSIS — Z992 Dependence on renal dialysis: Secondary | ICD-10-CM

## 2017-01-30 DIAGNOSIS — J9600 Acute respiratory failure, unspecified whether with hypoxia or hypercapnia: Secondary | ICD-10-CM

## 2017-01-30 LAB — HEPATITIS B SURFACE ANTIGEN: Hepatitis B Surface Ag: NEGATIVE

## 2017-01-30 LAB — RENAL FUNCTION PANEL
Albumin: 3.3 g/dL — ABNORMAL LOW (ref 3.5–5.0)
BUN: 95 mg/dL — ABNORMAL HIGH (ref 6–20)
CHLORIDE: 94 mmol/L — AB (ref 101–111)
CO2: 20 mmol/L — ABNORMAL LOW (ref 22–32)
Calcium: 9.3 mg/dL (ref 8.9–10.3)
Creatinine, Ser: 16.27 mg/dL — ABNORMAL HIGH (ref 0.61–1.24)
GFR, EST AFRICAN AMERICAN: 3 mL/min — AB (ref >60)
GFR, EST NON AFRICAN AMERICAN: 3 mL/min — AB (ref >60)
GLUCOSE: 175 mg/dL — AB (ref 65–99)
POTASSIUM: 4.9 mmol/L (ref 3.5–5.1)
Phosphorus: 7.9 mg/dL — ABNORMAL HIGH (ref 2.5–4.6)
Sodium: 135 mmol/L (ref 135–145)

## 2017-01-30 LAB — CBC
HEMATOCRIT: 29.8 % — AB (ref 39.0–52.0)
Hemoglobin: 9.7 g/dL — ABNORMAL LOW (ref 13.0–17.0)
MCH: 30.8 pg (ref 26.0–34.0)
MCHC: 32.6 g/dL (ref 30.0–36.0)
MCV: 94.6 fL (ref 78.0–100.0)
Platelets: 77 10*3/uL — ABNORMAL LOW (ref 150–400)
RBC: 3.15 MIL/uL — AB (ref 4.22–5.81)
RDW: 13.7 % (ref 11.5–15.5)
WBC: 2.8 10*3/uL — AB (ref 4.0–10.5)

## 2017-01-30 LAB — URINE CULTURE: Culture: NO GROWTH

## 2017-01-30 LAB — GLUCOSE, CAPILLARY
GLUCOSE-CAPILLARY: 153 mg/dL — AB (ref 65–99)
Glucose-Capillary: 177 mg/dL — ABNORMAL HIGH (ref 65–99)
Glucose-Capillary: 137 mg/dL — ABNORMAL HIGH (ref 65–99)

## 2017-01-30 MED ORDER — SODIUM CHLORIDE 0.9 % IV SOLN: 100.0000 mL | INTRAVENOUS | Status: DC | PRN

## 2017-01-30 MED ORDER — BUDESONIDE-FORMOTEROL FUMARATE 80-4.5 MCG/ACT IN AERO: 2.0000 | INHALATION_SPRAY | Freq: Two times a day (BID) | RESPIRATORY_TRACT | 1 refills | Status: DC

## 2017-01-30 MED ORDER — OSELTAMIVIR PHOSPHATE 30 MG PO CAPS: 30.0000 mg | ORAL_CAPSULE | ORAL | 0 refills | Status: DC

## 2017-01-30 MED ORDER — PENTAFLUOROPROP-TETRAFLUOROETH EX AERO: 1.0000 "application " | INHALATION_SPRAY | CUTANEOUS | Status: DC | PRN

## 2017-01-30 MED ORDER — LIDOCAINE-PRILOCAINE 2.5-2.5 % EX CREA: 1.0000 "application " | TOPICAL_CREAM | CUTANEOUS | Status: DC | PRN

## 2017-01-30 MED ORDER — LIDOCAINE HCL (PF) 1 % IJ SOLN: 5.0000 mL | INTRAMUSCULAR | Status: DC | PRN

## 2017-01-30 NOTE — Progress Notes (Signed)
Patient is to be discharged home and in stable condition. Patient's IV removed, WNL. Patient given discharge instructions and verbalized understanding. Patient denies the need for a wheelchair escort out, requesting to ambulate out.  Celestia Khat, RN

## 2017-01-30 NOTE — Progress Notes (Addendum)
Kirk [name]  MRN: [id number]  DOB/AGE: [birth year] 57 y.o.  Primary Care Physician:Reed, Tiffany L, DO  Admit date: 01/28/2017  Chief Complaint:  Chief Complaint  Patient presents with  . Shortness of Breath    S-Pt presented on  01/28/2017 with  Chief Complaint  Patient presents with  . Shortness of Breath  . Pt offers no new complaints. Pt says " I am waiting for my dialysis"     meds . acetaminophen  1,000 mg Oral Once  . amLODipine  10 mg Oral Daily  . calcitRIOL  0.25 mcg Oral Q M,W,F-HD  . cholecalciferol  5,000 Units Oral Daily  . cinacalcet  30 mg Oral QAC supper  . guaiFENesin  600 mg Oral BID  . hydrALAZINE  50 mg Oral Q8H  . insulin aspart  0-5 Units Subcutaneous QHS  . insulin aspart  0-9 Units Subcutaneous TID WC  . methylPREDNISolone (SOLU-MEDROL) injection  60 mg Intravenous Q12H  . metoprolol tartrate  75 mg Oral BID  . mometasone-formoterol  2 puff Inhalation BID  . multivitamin  1 tablet Oral QHS  . oseltamivir  30 mg Oral Q M,W,F-HD  . pantoprazole  40 mg Oral Daily  . pravastatin  40 mg Oral QPM  . sevelamer carbonate  2,400 mg Oral TID WC       Physical Exam: Vital signs in last 24 hours: Temp:  [98 F (36.7 C)-98.7 F (37.1 C)] 98.4 F (36.9 C) (01/08 2215) Pulse Rate:  [68-80] 72 (01/08 2215) Resp:  [20-27] 20 (01/08 2215) BP: (145-190)/(68-82) 161/71 (01/08 2215) SpO2:  [92 %-98 %] 95 % (01/08 2215) Weight change:  Last BM Date: 01/29/17  Intake/Output from previous day: 01/08 0701 - 01/09 0700 In: 240 [P.O.:240] Out: -  No intake/output data recorded.   Physical Exam: General- pt is awake,alert, oriented to time place and person Resp- No acute REsp distress,  Minimal ronchi   CVS- S1S2 regular in rate and rhythm GIT- BS+, soft, NT, ND EXT- NO LE Edema, Cyanosis Access- left AVF  Lab Results: CBC Recent Labs    01/28/17 0710 01/29/17 0408  WBC 4.4 2.5*  HGB 10.3* 10.3*  HCT 32.4* 31.6*  PLT 67* 74*     BMET Recent Labs    01/29/17 0408 01/30/17 0422  NA 137 135  K 4.6 4.9  CL 96* 94*  CO2 24 20*  GLUCOSE 168* 175*  BUN 58* 95*  CREATININE 13.80* 16.27*  CALCIUM 9.1 9.3    MICRO Recent Results (from the past 240 hour(s))  Blood Culture (routine x 2)     Status: None (Preliminary result)   Collection Time: 01/28/17  7:10 AM  Result Value Ref Range Status   Specimen Description BLOOD RIGHT ARM  Final   Special Requests   Final    BOTTLES DRAWN AEROBIC AND ANAEROBIC Blood Culture results may not be optimal due to an inadequate volume of blood received in culture bottles   Culture NO GROWTH 1 DAY  Final   Report Status PENDING  Incomplete  Blood Culture (routine x 2)     Status: None (Preliminary result)   Collection Time: 01/28/17  7:17 AM  Result Value Ref Range Status   Specimen Description BLOOD RIGHT ARM  Final   Special Requests   Final    BOTTLES DRAWN AEROBIC AND ANAEROBIC Blood Culture adequate volume   Culture NO GROWTH 1 DAY  Final   Report Status PENDING  Incomplete  MRSA PCR Screening  Status: None   Collection Time: 01/28/17  9:23 AM  Result Value Ref Range Status   MRSA by PCR NEGATIVE NEGATIVE Final    Comment:        The GeneXpert MRSA Assay (FDA approved for NASAL specimens only), is one component of a comprehensive MRSA colonization surveillance program. It is not intended to diagnose MRSA infection nor to guide or monitor treatment for MRSA infections.       Lab Results  Component Value Date   PTH 429 04/30/2015   CALCIUM 9.3 01/30/2017   CAION 1.02 (L) 09/08/2013   PHOS 7.9 (H) 01/30/2017       Impression: 1)Renal  ESRD on Hd               Pt is On Monday/Wednesday/Friday schedule               Pt was last  Dialyzed Monday               Hx of failed transplant               Will dialyze today  2)HTN  Medication- On Calcium Channel Blockers On Alpha and beta Blockers   3)Anemia In ESRD the goal for hgb ia  9--11    Pt hgb is at goal  will keep  on epo  4)CKD Mineral-Bone Disorder Secondary Hyperparathyroidism present  Phosphorus not at goal. on binders  5)Resp  admitted with dyspnea Multifactorial Fluid overload /Flu Primary MD following Now clinically better after HD yestersday  6)Electrolytes Normokalemic NOrmonatremic   7)Acid base Co2 at goal   8) ID- sepsis vs Flu On Tamiflu . Primary team following   9) Thrombocytopenia   hx of low platelets counts      Plan:  Will dialyze  Today   Addendum Pt seen on HD. Pt tolerating HD.  Alekai Pocock S 01/30/2017, 6:15 AM

## 2017-01-30 NOTE — Consult Note (Signed)
   [facility] CM Inpatient Consult   01/30/2017  Kirk [name] 06-10-1960 [id number]   Referral from inpatient case manager  for [place] Management services and post hospital discharge follow up related to a diagnosis of CKD and 4 admits and 2 ED visits in the last 6 months. Patient was evaluated for community based chronic disease management services with [facility] care Management Program as a benefit of patient's Next Gen Medicare. Called into patient room to explain [place] Management services. Patient states he takes dialysis M/W/F.  Patient states he drives himself to dialysis. Patient states his wife takes care of the finances and picks up his medications. He stated it is financially difficult to afford his medications and would appreciate a call from [facility] Pharmacist to discuss ways to cut medication costs.  Verbal consent received.. Patient gave [phone] as the best number to reach him and stated this is his spouse [name]'s number and he gave verbal permission to speak with her. Patient will receive post hospital discharge calls and be evaluated for monthly home visits and be contacted by [facility] Pharmacist. [place] Management services does not interfere with or replace any services arranged by the inpatient care management team. Made inpatient RNCM aware that [facility] will be following for care management. For additional questions please contact:   Zeena Starkel RN, [place] [facility] Liaison  [phone]) Business Mobile [phone]) Toll free office

## 2017-01-30 NOTE — Procedures (Signed)
    HEMODIALYSIS TREATMENT NOTE:   4 hour heparin-free dialysis completed via left forearm AVF (15g/antegrade). Goal met: 3 liters removed without interruption in ultrafiltration.  All blood was returned and hemostasis was achieved within 10 minutes. Report called to Barbra Sarks, RN.  Rockwell Alexandria, RN, CDN

## 2017-01-30 NOTE — Care Management Important Message (Signed)
Important Message  Patient Details  Name: Kirk [name] MRN: [id number] Date of Birth: 1960-04-30   Medicare Important Message Given:  Yes    Sherald Barge, RN 01/30/2017, 11:28 AM

## 2017-01-30 NOTE — Discharge Summary (Signed)
Physician Discharge Summary      Patient: Kirk [name]                   Admit date: 01/28/2017   DOB: Sep 20, 1960             Discharge date:01/30/2017/11:35 AM JXB:[id number]                           PCP: Gayland Curry, DO Recommendations for Outpatient Follow-up:   1. Please follow-up with your primary care physician within 1-2 weeks. 2.  Continue hemodialysis Monday Wednesdays and Fridays, no changes Follow with nephrologist according  Discharge Condition: Stable  CODE STATUS:  Full code /DNR/DNI/partial code   Diet recommendation:  Cardiac diet / Diabetic diet / Renal diet  ----------------------------------------------------------------------------------------------------------------------  Discharge Diagnoses:   Principal Problem:   Sepsis ([place]) Active Problems:   OSA on CPAP   DM (diabetes mellitus), type 2 with renal complications ([place])   HCAP (healthcare-associated pneumonia)   Thrombocytopenia (HCC)   ESRD on dialysis ([place])   Mild persistent chronic asthma without complication   Pulmonary edema   Acute respiratory failure with hypoxemia (HCC)   Acute respiratory failure with hypoxia (HCC)   History of present illness :  Kirk [name]  is a 57 y.o. male, with end-stage renal disease on dialysis (M, W, F), anemia of chronic disease, asthma, diabetes mellitus (controlled), GERD, hypertension, OSA on CPAP who was sent from dialysis with fever of 103F. Patient was having fever with shortness of breath, nonpertinent cough, wheezing and body aches of 3 days duration. Associated with nausea but no vomiting. Patient went to the ED 2 days back and was given supportive care for flulike symptoms and discharged home. However symptoms do not improve and was having persistent fever at home with chills. Patient reports chest tightness and some headache with generalized body aches. Denies any diarrhea or dysuria (make some urine). Denies any sick contact or recent illness.  Denies any recent travel.  Course in the ED Patient was septic with fever of 100.60 Fahrenheit, tachypneic, hypertensive and hypoxic requiring BiPAP. Sepsis pathway initiated in the ED. Blood will showed normal WBC, hemoglobin of 10.3, platelets of 67, sodium 135, potassium 4.7, BUN of 77 and creatinine of 18 . Chest x-ray showed diffuse airspace disease suggestive of worsening pulmonary edema cannot rule out underlying infection. Lactic acid was normal. Blood cultures and flu PCR sent from the ED and patient started on empiric vancomycin and Zosyn for sepsis. Subsequent patient was intubated to ICU, for the following:  Hospital course / Brief Summary:   Sepsis ([place])  - Secondary to influenza A and? HCP. Received Tamiflu after dialysis yesterday. Next dose of Tamiflu will be during next hemodialysis. D/c  empiric vancomycin and Zosyn. Blood cultures are negative to date. Sepsis currently resolved.    Acute respiratory failure with hypoxia (HCC) - Secondary to influenza A with pulmonary edema. Follow-up chest x-ray this morning shows significant improvement in the pulmonary edema without infiltrate doubting possible pneumonia. Required BiPAP on admission and improved after dialysis and receiving treatments with Tamiflu, antibiotic and nebulizer. Stable on room air at present.  Currently satting on room air greater than 97% Pneumonia ruled out. Discontinue antibiotics, continue Tamiflu  Leukopenia -monitor closely, at baseline  Thrombocytopenia (HCC) -Has chronic thrombocytopenia with baseline platelets in low 100, father lower due to sepsis.  Remained stable, no signs of bleeding.  Currently platelets 77  ESRD on dialysis (HCC)-nephrologist was consulted, patient resumed dialysis per schedule Monday Wednesdays and Friday, today he is due for hemodialysis.  Essential hypertension was Hypertensive in the ED. Improved after respiratory status better and following dialysis.  OSA on CPAP  - Continue nighttime CPAP  DM (diabetes mellitus), type 2 with renal complications (HCC) Stable on sliding scale coverage.  Tobacco abuse- Counseled on cessation.  Bleeding from left AV fistula - Scant amount of bleeding noted yesterday. H&H stable. Will monitor for now. Patient reports having a fistulogram scheduled within the next 2 weeks.  Disposition -currently stable in no acute distress on room air walking around comfortable.  Denies any chest pain or shortness of breath.  Due for hemodialysis today. He is cleared to be discharged home.    Consultations:   Pulmonary   Procedures:  Dialysis last hemodialysis on January 30, 2017  ----------------------------------------------------------------------------------------------------------------------  Discharge Instructions:   Discharge Instructions    Activity as tolerated - No restrictions   Complete by:  As directed    Diet - low sodium heart healthy   Complete by:  As directed    Renal diet   Discharge instructions   Complete by:  As directed    Please continue the Tamiflu as instructed Follow-up with your regular hemodialysis days Follow-up with your nephrologist in 1  wk   Increase activity slowly   Complete by:  As directed        Medication List    TAKE these medications   acetaminophen 500 MG tablet Commonly known as:  TYLENOL Take 1 tablet (500 mg total) by mouth every 6 (six) hours as needed for moderate pain.   amLODipine 10 MG tablet Commonly known as:  NORVASC TAKE 1 TABLET DAILY   aspirin EC 81 MG tablet Take 81 mg by mouth daily.   b complex-vitamin c-folic acid 0.8 MG Tabs tablet TAKE 1 TABLET BY MOUTH EVERY DAY   BENADRYL 25 MG tablet Generic drug:  diphenhydrAMINE Take 25 mg by mouth at bedtime as needed for allergies.   budesonide-formoterol 80-4.5 MCG/ACT inhaler Commonly known as:  SYMBICORT Inhale 2 puffs into the lungs 2 (two) times daily.   calcitRIOL 0.25 MCG  capsule Commonly known as:  ROCALTROL Take 1 capsule (0.25 mcg total) by mouth every Monday, Wednesday, and Friday with hemodialysis.   calcium acetate 667 MG capsule Commonly known as:  PHOSLO Take by mouth 3 (three) times daily with meals. Take 4 capsule with snacks and 8 capsules with meals.   clonazePAM 0.5 MG tablet Commonly known as:  KLONOPIN Take 1 tablet (0.5 mg total) by mouth 2 (two) times daily. What changed:    when to take this  reasons to take this   guaiFENesin 600 MG 12 hr tablet Commonly known as:  MUCINEX Take 600 mg by mouth 2 (two) times daily.   guaiFENesin-dextromethorphan 100-10 MG/5ML syrup Commonly known as:  ROBITUSSIN DM Take 10 mLs by mouth every 4 (four) hours as needed for cough.   hydrALAZINE 50 MG tablet Commonly known as:  APRESOLINE Take 1 tablet (50 mg total) by mouth every 8 (eight) hours.   metoprolol tartrate 50 MG tablet Commonly known as:  LOPRESSOR Take One and a half tablet by mouth twice daily   omeprazole 20 MG capsule Commonly known as:  PRILOSEC TAKE 1 CAPSULE BY MOUTH 2 TIMES DAILY BEFORE A MEAL.   ondansetron 4 MG disintegrating tablet Commonly known as:  ZOFRAN ODT Take 1 tablet (4 mg total)  by mouth every 8 (eight) hours as needed for nausea or vomiting.   oseltamivir 30 MG capsule Commonly known as:  TAMIFLU Take 1 capsule (30 mg total) by mouth every Monday, Wednesday, and Friday with hemodialysis for 4 doses.   pravastatin 40 MG tablet Commonly known as:  PRAVACHOL TAKE 1 TABLET EVERY EVENING   SENSIPAR 30 MG tablet Generic drug:  cinacalcet Take 30 mg by mouth daily. With dinner.   sevelamer carbonate 800 MG tablet Commonly known as:  RENVELA Take 3 tablets (2,400 mg total) by mouth 3 (three) times daily with meals.   traMADol 50 MG tablet Commonly known as:  ULTRAM Take 1 tablet (50 mg total) by mouth daily as needed (severe headache).   Vitamin D3 5000 units Tabs Take 1 tablet by mouth daily.        Allergies  Allergen Reactions  . Codeine Nausea And Vomiting and Nausea Only    Altered mental status      Procedures/Studies: Dg Chest 2 View  Result Date: 01/29/2017 CLINICAL DATA:  Pneumonia. EXAM: CHEST  2 VIEW COMPARISON:  Chest x-ray from yesterday. FINDINGS: Stable mild cardiomegaly. Diffuse opacities in both lungs have significantly improved. Mildly increased interstitial markings persist. No pleural effusion or pneumothorax. No acute osseous abnormality. IMPRESSION: 1. Significant improvement in bilateral airspace disease, suggesting resolving edema given the rapid improvement. Mild residual interstitial edema. Electronically Signed   By: Titus Dubin M.D.   On: 01/29/2017 08:12   Dg Chest 2 View  Result Date: 01/26/2017 CLINICAL DATA:  Shortness of breath, end-stage renal disease on dialysis, hypertension, gout EXAM: CHEST  2 VIEW COMPARISON:  11/11/2016 FINDINGS: Enlargement of cardiac silhouette with pulmonary vascular congestion. Mediastinal contours normal. Minimal chronic peribronchial thickening. Slight chronic accentuation of interstitial markings in the mid to lower lungs unchanged since previous exam. No definite acute infiltrate, pleural effusion or pneumothorax. Linear subsegmental atelectasis RIGHT mid lung. No acute osseous findings. IMPRESSION: Enlargement of cardiac silhouette with minimal subsegmental atelectasis in RIGHT mid lung and chronic accentuation of interstitial markings. No definite acute infiltrate. Electronically Signed   By: Lavonia Dana M.D.   On: 01/26/2017 19:10   Dg Chest Port 1 View  Result Date: 01/28/2017 CLINICAL DATA:  57 year old male with shortness breath. On dialysis. Fever. Subsequent encounter. EXAM: PORTABLE CHEST 1 VIEW COMPARISON:  01/26/2017 and 11/11/2016 chest x-ray. FINDINGS: Progressive diffuse airspace disease suggestive of pulmonary edema. Given patient's history of fever, interstitial infection process not entirely excluded  although felt to be less likely consideration. Cardiomegaly. Calcified aorta. No acute osseous abnormality. IMPRESSION: Progressive diffuse airspace disease suggestive of worsening pulmonary edema. Given patient's history of fever, interstitial infection process not entirely excluded although felt to be less likely consideration. Cardiomegaly. Electronically Signed   By: Genia Del M.D.   On: 01/28/2017 07:54      Subjective: Patient was seen and examined 01/30/2017, 11:35 AM Patient stable  Today. No acute distress.  No issues overnight Stable for discharge.  Discharge Exam:  Vitals:   01/29/17 2149 01/29/17 2215 01/30/17 0639 01/30/17 0732  BP:  (!) 161/71 (!) 171/59   Pulse:  72 66   Resp:  20 14   Temp:  98.4 F (36.9 C) (!) 97.5 F (36.4 C)   TempSrc:  Oral Oral   SpO2: 91% 95% 100% 100%  Weight:      Height:        General: Pt lying comfortably in bed & appears in no obvious distress. Cardiovascular:  S1 & S2 heard, RRR, S1/S2 +. No murmurs, rubs, gallops or clicks. No JVD or pedal edema. Respiratory: Clear to auscultation without wheezing, rhonchi or crackles. No increased work of breathing. Abdominal:  Non distended, non tender & soft. No organomegaly or masses appreciated. Normal bowel sounds heard. CNS: Alert and oriented. No focal deficits. Extremities: no edema, no cyanosis    The results of significant diagnostics from this hospitalization (including imaging, microbiology, ancillary and laboratory) are listed below for reference.     Microbiology: Recent Results (from the past 240 hour(s))  Blood Culture (routine x 2)     Status: None (Preliminary result)   Collection Time: 01/28/17  7:10 AM  Result Value Ref Range Status   Specimen Description BLOOD RIGHT ARM  Final   Special Requests   Final    BOTTLES DRAWN AEROBIC AND ANAEROBIC Blood Culture results may not be optimal due to an inadequate volume of blood received in culture bottles   Culture NO GROWTH 2  DAYS  Final   Report Status PENDING  Incomplete  Blood Culture (routine x 2)     Status: None (Preliminary result)   Collection Time: 01/28/17  7:17 AM  Result Value Ref Range Status   Specimen Description BLOOD RIGHT ARM  Final   Special Requests   Final    BOTTLES DRAWN AEROBIC AND ANAEROBIC Blood Culture adequate volume   Culture NO GROWTH 2 DAYS  Final   Report Status PENDING  Incomplete  MRSA PCR Screening     Status: None   Collection Time: 01/28/17  9:23 AM  Result Value Ref Range Status   MRSA by PCR NEGATIVE NEGATIVE Final    Comment:        The GeneXpert MRSA Assay (FDA approved for NASAL specimens only), is one component of a comprehensive MRSA colonization surveillance program. It is not intended to diagnose MRSA infection nor to guide or monitor treatment for MRSA infections.      Labs: CBC: Recent Labs  Lab 01/26/17 1810 01/28/17 0710 01/29/17 0408 01/30/17 0422  WBC 3.4* 4.4 2.5* 2.8*  NEUTROABS 2.7 3.5  --   --   HGB 10.6* 10.3* 10.3* 9.7*  HCT 33.5* 32.4* 31.6* 29.8*  MCV 96.3 96.4 94.9 94.6  PLT 89* 67* 74* 77*   Basic Metabolic Panel: Recent Labs  Lab 01/26/17 1810 01/28/17 0710 01/29/17 0408 01/30/17 0422  NA 133* 135 137 135  K 3.6 4.7 4.6 4.9  CL 96* 97* 96* 94*  CO2 23 18* 24 20*  GLUCOSE 121* 113* 168* 175*  BUN 48* 77* 58* 95*  CREATININE 13.22* 18.05* 13.80* 16.27*  CALCIUM 8.0* 8.4* 9.1 9.3  PHOS  --   --  7.1* 7.9*   Liver Function Tests: Recent Labs  Lab 01/26/17 1810 01/28/17 0710 01/29/17 0408 01/30/17 0422  AST 27 35  --   --   ALT 19 31  --   --   ALKPHOS 73 75  --   --   BILITOT 1.1 1.3*  --   --   PROT 7.1 7.4  --   --   ALBUMIN 3.5 3.7 3.4* 3.3*   BNP (last 3 results) Recent Labs    09/12/16 2338  BNP 517.0*   Cardiac Enzymes: No results for input(s): CKTOTAL, CKMB, CKMBINDEX, TROPONINI in the last 168 hours. CBG: Recent Labs  Lab 01/29/17 1117 01/29/17 1703 01/29/17 2247 01/30/17 0742  01/30/17 1121  GLUCAP 192* 150* 182* 177* 153*   Hgb  A1c No results for input(s): HGBA1C in the last 72 hours. Lipid Profile No results for input(s): CHOL, HDL, LDLCALC, TRIG, CHOLHDL, LDLDIRECT in the last 72 hours. Thyroid function studies No results for input(s): TSH, T4TOTAL, T3FREE, THYROIDAB in the last 72 hours.  Invalid input(s): FREET3 Anemia work up No results for input(s): VITAMINB12, FOLATE, FERRITIN, TIBC, IRON, RETICCTPCT in the last 72 hours. Urinalysis    Component Value Date/Time   COLORURINE YELLOW 01/28/2017 0930   APPEARANCEUR HAZY (A) 01/28/2017 0930   LABSPEC 1.014 01/28/2017 0930   PHURINE 6.0 01/28/2017 0930   GLUCOSEU NEGATIVE 01/28/2017 0930   HGBUR MODERATE (A) 01/28/2017 0930   BILIRUBINUR NEGATIVE 01/28/2017 0930   KETONESUR NEGATIVE 01/28/2017 0930   PROTEINUR 100 (A) 01/28/2017 0930   UROBILINOGEN 0.2 04/06/2014 1030   NITRITE NEGATIVE 01/28/2017 0930   LEUKOCYTESUR NEGATIVE 01/28/2017 0930      Time coordinating discharge: Over 30 minutes  SIGNED: Deatra James, MD, FACP, FHM. [facility] Pager [phone]  If 7PM-7AM, please contact night-coverage [email or web address] Password [email or web address] 01/30/2017, 11:35 AM

## 2017-01-30 NOTE — Care Management Note (Signed)
Case Management Note  Patient Details  Name: COHAN STIPES MRN: [id number] Date of Birth: [birth year]   Expected Discharge Date:     01/31/2016             Expected Discharge Plan:  Home/Self Care  In-House Referral:  NA  Discharge planning Services  CM Consult  Post Acute Care Choice:  NA Choice offered to:  NA  Status of Service:  Completed, signed off  If discussed at Long Length of Stay Meetings, dates discussed:    Additional Comments: anticipate DC home today. No HH or DME needs. Pt with 4 hospitalization and in past 6 months. Multiple co-morbidities. CM will make referral to [facility].   Sherald Barge, RN 01/30/2017, 11:28 AM

## 2017-01-31 ENCOUNTER — Telehealth: Payer: Self-pay

## 2017-01-31 ENCOUNTER — Other Ambulatory Visit: Payer: Self-pay | Admitting: *Deleted

## 2017-01-31 NOTE — Telephone Encounter (Signed)
I have made the 1st attempt to contact the patient or family member in charge, in order to follow up from recently being discharged from the hospital. I left a message on voicemail but I will make another attempt at a different time.  

## 2017-01-31 NOTE — Patient Outreach (Signed)
[place] [facility]) Care Management  01/31/2017  Kirk [name] 08-25-1960 [id number]    Transition of care (Initial outreach)  RN attempted the initial outreach call to this pt however unsuccessful and RN unable to leave a voice message to request a call back. Will scheduled another contact for tomorrow and continue to address possible needs.  Raina Mina, RN Care Management Coordinator [place] Office [phone]

## 2017-02-01 ENCOUNTER — Other Ambulatory Visit: Payer: Self-pay | Admitting: *Deleted

## 2017-02-01 ENCOUNTER — Other Ambulatory Visit: Payer: Self-pay | Admitting: Pharmacist

## 2017-02-01 DIAGNOSIS — N186 End stage renal disease: Secondary | ICD-10-CM | POA: Diagnosis not present

## 2017-02-01 DIAGNOSIS — N2581 Secondary hyperparathyroidism of renal origin: Secondary | ICD-10-CM | POA: Diagnosis not present

## 2017-02-01 DIAGNOSIS — D631 Anemia in chronic kidney disease: Secondary | ICD-10-CM | POA: Diagnosis not present

## 2017-02-01 NOTE — Patient Outreach (Signed)
Kirk [name] was referred to pharmacy for medication assistance. Call patient, but patient does not answer and unable to leave a voicemail. If have not heard from patient by next week, will give him another call at that time.  Harlow Asa, PharmD, [place] Management [phone]

## 2017-02-01 NOTE — Telephone Encounter (Signed)
I have made the 2nd attempt to contact the patient or family member in charge, in order to follow up from recently being discharged from the hospital. I left a message on voicemail but I will make another attempt at a different time.  

## 2017-02-01 NOTE — Patient Outreach (Signed)
[place] [facility]) Care Management  02/01/2017  Kirk [name] [birth year] [id number]    Transition of care  2nd outreach attempt remains unsuccessful. Will reschedule another attempt next week to reach pt for possible needs. Unable to leave a voice message on today's call.    Raina Mina, RN Care Management Coordinator [place] Office [phone]

## 2017-02-02 LAB — CULTURE, BLOOD (ROUTINE X 2)
Culture: NO GROWTH
Culture: NO GROWTH
Special Requests: ADEQUATE

## 2017-02-04 ENCOUNTER — Other Ambulatory Visit: Payer: Self-pay | Admitting: Pharmacist

## 2017-02-04 ENCOUNTER — Other Ambulatory Visit: Payer: Self-pay | Admitting: *Deleted

## 2017-02-04 DIAGNOSIS — N186 End stage renal disease: Secondary | ICD-10-CM | POA: Diagnosis not present

## 2017-02-04 DIAGNOSIS — D631 Anemia in chronic kidney disease: Secondary | ICD-10-CM | POA: Diagnosis not present

## 2017-02-04 DIAGNOSIS — N2581 Secondary hyperparathyroidism of renal origin: Secondary | ICD-10-CM | POA: Diagnosis not present

## 2017-02-04 NOTE — Patient Outreach (Signed)
[name] was referred to pharmacy for medication assistance. Outreach attempt #2. Call patient, but patient does not answer and unable to leave a voicemail as voicemail is full. If have not heard from patient by 02/06/17, will give him another call at that time.  Harlow Asa, PharmD, [place] Management [phone]

## 2017-02-04 NOTE — Patient Outreach (Signed)
[place] [facility]) Care Management  02/04/2017  Kirk [name] 1960-08-11 [id number]    Third outreach attempt however will attempt once again due to pt's dialysis days MWF however also unsuccessful on last Thursday's outreach. Will reschedule once again for tomorrow Tuesday for pending [facility] services.  Raina Mina, RN Care Management Coordinator [place] Office [phone]

## 2017-02-05 ENCOUNTER — Encounter: Payer: Self-pay | Admitting: *Deleted

## 2017-02-05 ENCOUNTER — Ambulatory Visit (INDEPENDENT_AMBULATORY_CARE_PROVIDER_SITE_OTHER): Payer: Medicare Other | Admitting: Nurse Practitioner

## 2017-02-05 ENCOUNTER — Encounter: Payer: Self-pay | Admitting: Nurse Practitioner

## 2017-02-05 ENCOUNTER — Other Ambulatory Visit: Payer: Self-pay | Admitting: *Deleted

## 2017-02-05 VITALS — BP 168/70 | HR 62 | Ht 67.0 in | Wt 228.0 lb

## 2017-02-05 DIAGNOSIS — K648 Other hemorrhoids: Secondary | ICD-10-CM

## 2017-02-05 DIAGNOSIS — N186 End stage renal disease: Secondary | ICD-10-CM | POA: Diagnosis not present

## 2017-02-05 DIAGNOSIS — D631 Anemia in chronic kidney disease: Secondary | ICD-10-CM | POA: Diagnosis not present

## 2017-02-05 DIAGNOSIS — N2581 Secondary hyperparathyroidism of renal origin: Secondary | ICD-10-CM | POA: Diagnosis not present

## 2017-02-05 DIAGNOSIS — Z992 Dependence on renal dialysis: Secondary | ICD-10-CM | POA: Diagnosis not present

## 2017-02-05 DIAGNOSIS — T82858A Stenosis of vascular prosthetic devices, implants and grafts, initial encounter: Secondary | ICD-10-CM | POA: Diagnosis not present

## 2017-02-05 DIAGNOSIS — I871 Compression of vein: Secondary | ICD-10-CM | POA: Diagnosis not present

## 2017-02-05 MED ORDER — HYDROCORTISONE 2.5 % RE CREA: 1.0000 "application " | TOPICAL_CREAM | Freq: Every day | RECTAL | 0 refills | Status: DC

## 2017-02-05 NOTE — Patient Outreach (Signed)
[place] [facility]) Care Management  02/05/2017  SALIK GREWELL [birth year] [id number]    RN has made several outreach calls to this pt however all remain unsuccessful. Will mail outreach letter and await a possible response from pt for possible [facility] services. If no response will notify primary provider and close this case.  Raina Mina, RN Care Management Coordinator [place] Office [phone]

## 2017-02-05 NOTE — Progress Notes (Signed)
Chief Complaint:  Follow up on diarrhea   HPI: Patient is a 57 yo male who is s/p kidney transplant several years go, back on HD.Patient known to Dr. Ardis Hughs who has followed him for hx of adenomatous colon polyps and recurrent C-diff. I saw him mid December with loose stools, he had completed Dificid 3 weeks prior. I requested repeat C-diff study but it was not submitted as he was never in the "right place to have it done". The diarrhea was actually normal on imodium but wife was just concerned about his use of.   Patient returns for follow up. His diarrhea has resolved.Wallie Renshaw has a loose BM but otherwise stools solid, even without anti-diarrheal medication. Wilford feels okay. He has occasionally passed a small amount of blood from rectum. Sometimes the blood seeps from rectum without him being aware. He has no abdominal or rectal pain.    Past Medical History:  Diagnosis Date  . Acute edema of lung, unspecified   . Acute, but ill-defined, cerebrovascular disease   . Allergy   . Anemia   . 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)   . Anxiety   . Asthma   . Carpal tunnel syndrome   . Cellulitis and abscess of trunk   . Cholelithiasis 07/13/2014  . Chronic headaches   . Debility, unspecified   . Dermatophytosis of the body   . Dysrhythmia    history of  . Edema   . End stage renal disease on dialysis [facility])    "MWF; [facility]" (10/21/2014)  . Essential hypertension, benign   . GERD (gastroesophageal reflux disease)   . Gout, unspecified   . HTN (hypertension)   . Hypertrophy of prostate without urinary obstruction and other lower urinary tract symptoms (LUTS)   . Hypotension, unspecified   . Impotence of organic origin   . Insomnia, unspecified   . Kidney replaced by transplant   . Localization-related (focal) (partial) epilepsy and epileptic syndromes with complex partial seizures, without mention of intractable epilepsy   . Lumbago   . Memory loss   .  OSA on CPAP   . Other and unspecified hyperlipidemia   . Other chronic nonalcoholic liver disease   . Other malaise and fatigue   . Other nonspecific abnormal serum enzyme levels   . Pneumonia "several times"  . Renal dialysis status(V45.11) 02/05/2010   restarted 01/02/13 ofter renal trransplant failure  . Secondary hyperparathyroidism (of renal origin)   . Sleep apnea   . Tension headache   . Unspecified constipation   . Unspecified essential hypertension   . Unspecified hereditary and idiopathic peripheral neuropathy   . Unspecified vitamin D deficiency     Patient's surgical history, family medical history, social history, medications and allergies were all reviewed in Epic    Physical Exam: BP (!) 168/70   Pulse 62   Ht 5\' 7"  (1.702 m)   Wt 228 lb (103.4 kg)   SpO2 94%   BMI 35.71 kg/m   GENERAL:  Well developed male in NAD PSYCH: :Pleasant, cooperative, normal affect ABDOMEN:  Nondistended, soft, nontender. No obvious masses,  normal bowel sounds RECTAL: scant yellowish colored stool in vault. Mildly swollen RP hemorrhoid Musculoskeletal:  Normal muscle tone, normal strength NEURO: Alert and oriented x 3, no focal neurologic deficits   ASSESSMENT and PLAN:  1. Pleasant 57 year old male here for follow up on diarrhea / recurrent C-diff. The diarrhea has finally resolved, even without anti-diarrheal meds.  2. Intermittent painless, scant rectal bleeding, mainly in form of leakage. He has mild swelling of RP internal hemorrhoid which is probably the source. I didn't see a fissure and DRE / anoscopy not painful arguing against a fissure. He is s/p polyp surveillance colonoscopy less than 6 mos ago -anusol HC cream Q HS x 7. Call us with update in couple of weeks. Further workup depending on response   Tye Savoy , NP 02/05/2017, 3:07 PM

## 2017-02-05 NOTE — Patient Instructions (Signed)
If you are age 55 or older, your body mass index should be between 23-30. Your Body mass index is 35.71 kg/m. If this is out of the aforementioned range listed, please consider follow up with your Primary Care Provider.  If you are age 37 or younger, your body mass index should be between 19-25. Your Body mass index is 35.71 kg/m. If this is out of the aformentioned range listed, please consider follow up with your Primary Care Provider.   We have sent the following medications to your pharmacy for you to pick up at your convenience: Anusol cream  Call in two weeks with an update.  Thank you for choosing me and [facility].   Tye Savoy, NP

## 2017-02-06 ENCOUNTER — Other Ambulatory Visit: Payer: Self-pay | Admitting: Pharmacist

## 2017-02-06 DIAGNOSIS — N2581 Secondary hyperparathyroidism of renal origin: Secondary | ICD-10-CM | POA: Diagnosis not present

## 2017-02-06 DIAGNOSIS — D631 Anemia in chronic kidney disease: Secondary | ICD-10-CM | POA: Diagnosis not present

## 2017-02-06 DIAGNOSIS — N186 End stage renal disease: Secondary | ICD-10-CM | POA: Diagnosis not present

## 2017-02-06 NOTE — Patient Outreach (Signed)
[name] was referred to pharmacy for medication assistance. Outreach attempt #3. Call patient, but patient does not answer and unable to leave a message as no voicemail picks up. Note that RNCM Raina Mina has mailed the patient an outreach letter in attempt to reach him on 02/05/17. Will follow with RNCM in 10 days, if no response from patient, will close pharmacy episode at that time.   Harlow Asa, PharmD, [place] Management [phone]

## 2017-02-06 NOTE — Progress Notes (Signed)
I agree with the above note, plan 

## 2017-02-08 DIAGNOSIS — N2581 Secondary hyperparathyroidism of renal origin: Secondary | ICD-10-CM | POA: Diagnosis not present

## 2017-02-08 DIAGNOSIS — N186 End stage renal disease: Secondary | ICD-10-CM | POA: Diagnosis not present

## 2017-02-08 DIAGNOSIS — D631 Anemia in chronic kidney disease: Secondary | ICD-10-CM | POA: Diagnosis not present

## 2017-02-11 DIAGNOSIS — N186 End stage renal disease: Secondary | ICD-10-CM | POA: Diagnosis not present

## 2017-02-11 DIAGNOSIS — N2581 Secondary hyperparathyroidism of renal origin: Secondary | ICD-10-CM | POA: Diagnosis not present

## 2017-02-11 DIAGNOSIS — D631 Anemia in chronic kidney disease: Secondary | ICD-10-CM | POA: Diagnosis not present

## 2017-02-13 DIAGNOSIS — N2581 Secondary hyperparathyroidism of renal origin: Secondary | ICD-10-CM | POA: Diagnosis not present

## 2017-02-13 DIAGNOSIS — D631 Anemia in chronic kidney disease: Secondary | ICD-10-CM | POA: Diagnosis not present

## 2017-02-13 DIAGNOSIS — N186 End stage renal disease: Secondary | ICD-10-CM | POA: Diagnosis not present

## 2017-02-15 DIAGNOSIS — N186 End stage renal disease: Secondary | ICD-10-CM | POA: Diagnosis not present

## 2017-02-15 DIAGNOSIS — D631 Anemia in chronic kidney disease: Secondary | ICD-10-CM | POA: Diagnosis not present

## 2017-02-15 DIAGNOSIS — N2581 Secondary hyperparathyroidism of renal origin: Secondary | ICD-10-CM | POA: Diagnosis not present

## 2017-02-18 DIAGNOSIS — N2581 Secondary hyperparathyroidism of renal origin: Secondary | ICD-10-CM | POA: Diagnosis not present

## 2017-02-18 DIAGNOSIS — N186 End stage renal disease: Secondary | ICD-10-CM | POA: Diagnosis not present

## 2017-02-18 DIAGNOSIS — D631 Anemia in chronic kidney disease: Secondary | ICD-10-CM | POA: Diagnosis not present

## 2017-02-19 ENCOUNTER — Other Ambulatory Visit: Payer: Self-pay

## 2017-02-19 ENCOUNTER — Encounter (HOSPITAL_COMMUNITY): Payer: Self-pay | Admitting: Emergency Medicine

## 2017-02-19 ENCOUNTER — Emergency Department (HOSPITAL_COMMUNITY): Payer: Medicare Other

## 2017-02-19 ENCOUNTER — Emergency Department (HOSPITAL_COMMUNITY)
Admission: EM | Admit: 2017-02-19 | Discharge: 2017-02-19 | Disposition: A | Discharge status: Home / Self Care | Payer: Medicare Other | Attending: Emergency Medicine | Admitting: Emergency Medicine

## 2017-02-19 DIAGNOSIS — J45909 Unspecified asthma, uncomplicated: Secondary | ICD-10-CM | POA: Diagnosis not present

## 2017-02-19 DIAGNOSIS — Z992 Dependence on renal dialysis: Secondary | ICD-10-CM | POA: Diagnosis not present

## 2017-02-19 DIAGNOSIS — N186 End stage renal disease: Secondary | ICD-10-CM | POA: Diagnosis not present

## 2017-02-19 DIAGNOSIS — Z79899 Other long term (current) drug therapy: Secondary | ICD-10-CM | POA: Insufficient documentation

## 2017-02-19 DIAGNOSIS — R05 Cough: Secondary | ICD-10-CM | POA: Diagnosis not present

## 2017-02-19 DIAGNOSIS — R112 Nausea with vomiting, unspecified: Secondary | ICD-10-CM | POA: Diagnosis not present

## 2017-02-19 DIAGNOSIS — I120 Hypertensive chronic kidney disease with stage 5 chronic kidney disease or end stage renal disease: Secondary | ICD-10-CM | POA: Insufficient documentation

## 2017-02-19 DIAGNOSIS — F17210 Nicotine dependence, cigarettes, uncomplicated: Secondary | ICD-10-CM | POA: Insufficient documentation

## 2017-02-19 DIAGNOSIS — Z7982 Long term (current) use of aspirin: Secondary | ICD-10-CM | POA: Diagnosis not present

## 2017-02-19 DIAGNOSIS — R51 Headache: Secondary | ICD-10-CM | POA: Insufficient documentation

## 2017-02-19 DIAGNOSIS — N3001 Acute cystitis with hematuria: Secondary | ICD-10-CM | POA: Diagnosis not present

## 2017-02-19 DIAGNOSIS — R519 Headache, unspecified: Secondary | ICD-10-CM

## 2017-02-19 DIAGNOSIS — R0602 Shortness of breath: Secondary | ICD-10-CM | POA: Diagnosis not present

## 2017-02-19 LAB — URINALYSIS, ROUTINE W REFLEX MICROSCOPIC
Bilirubin Urine: NEGATIVE
GLUCOSE, UA: 50 mg/dL — AB
KETONES UR: NEGATIVE mg/dL
Nitrite: NEGATIVE
Specific Gravity, Urine: 1.012 (ref 1.005–1.030)
pH: 8 (ref 5.0–8.0)

## 2017-02-19 LAB — COMPREHENSIVE METABOLIC PANEL
ALT: 20 U/L (ref 17–63)
ANION GAP: 18 — AB (ref 5–15)
AST: 21 U/L (ref 15–41)
Albumin: 4.1 g/dL (ref 3.5–5.0)
Alkaline Phosphatase: 69 U/L (ref 38–126)
BUN: 34 mg/dL — ABNORMAL HIGH (ref 6–20)
CHLORIDE: 93 mmol/L — AB (ref 101–111)
CO2: 27 mmol/L (ref 22–32)
CREATININE: 11.04 mg/dL — AB (ref 0.61–1.24)
Calcium: 9.7 mg/dL (ref 8.9–10.3)
GFR, EST AFRICAN AMERICAN: 5 mL/min — AB (ref >60)
GFR, EST NON AFRICAN AMERICAN: 5 mL/min — AB (ref >60)
Glucose, Bld: 143 mg/dL — ABNORMAL HIGH (ref 65–99)
POTASSIUM: 4.2 mmol/L (ref 3.5–5.1)
SODIUM: 138 mmol/L (ref 135–145)
Total Bilirubin: 0.7 mg/dL (ref 0.3–1.2)
Total Protein: 7.9 g/dL (ref 6.5–8.1)

## 2017-02-19 LAB — CBC
HCT: 33.5 % — ABNORMAL LOW (ref 39.0–52.0)
HEMOGLOBIN: 10.8 g/dL — AB (ref 13.0–17.0)
MCH: 30.7 pg (ref 26.0–34.0)
MCHC: 32.2 g/dL (ref 30.0–36.0)
MCV: 95.2 fL (ref 78.0–100.0)
PLATELETS: 113 10*3/uL — AB (ref 150–400)
RBC: 3.52 MIL/uL — AB (ref 4.22–5.81)
RDW: 14.2 % (ref 11.5–15.5)
WBC: 4.1 10*3/uL (ref 4.0–10.5)

## 2017-02-19 LAB — LIPASE, BLOOD: LIPASE: 43 U/L (ref 11–51)

## 2017-02-19 MED ORDER — SODIUM CHLORIDE 0.9 % IV SOLN
INTRAVENOUS | Status: DC
  Administered 2017-02-19: 17:00:00 via INTRAVENOUS

## 2017-02-19 MED ORDER — KETOROLAC TROMETHAMINE 30 MG/ML IJ SOLN
30.0000 mg | Freq: Once | INTRAMUSCULAR | Status: AC
  Administered 2017-02-19: 30 mg via INTRAVENOUS
  Filled 2017-02-19: qty 1

## 2017-02-19 MED ORDER — PROMETHAZINE HCL 25 MG/ML IJ SOLN
INTRAMUSCULAR | Status: AC
  Filled 2017-02-19: qty 1

## 2017-02-19 MED ORDER — PROMETHAZINE HCL 25 MG PO TABS: 25.0000 mg | ORAL_TABLET | Freq: Four times a day (QID) | ORAL | 0 refills | Status: DC | PRN

## 2017-02-19 MED ORDER — CEPHALEXIN 500 MG PO CAPS: 500.0000 mg | ORAL_CAPSULE | Freq: Every day | ORAL | 0 refills | Status: AC

## 2017-02-19 MED ORDER — PROMETHAZINE HCL 25 MG/ML IJ SOLN
25.0000 mg | Freq: Once | INTRAMUSCULAR | Status: AC
  Administered 2017-02-19: 25 mg via INTRAMUSCULAR
  Filled 2017-02-19: qty 1

## 2017-02-19 MED ORDER — CEFTRIAXONE SODIUM 1 G IJ SOLR
1.0000 g | Freq: Once | INTRAMUSCULAR | Status: AC
  Administered 2017-02-19: 1 g via INTRAVENOUS
  Filled 2017-02-19: qty 10

## 2017-02-19 NOTE — ED Provider Notes (Signed)
[facility] EMERGENCY DEPARTMENT Provider Note   CSN: [id number] Arrival date & time: 02/19/17  1256     History   Chief Complaint Chief Complaint  Patient presents with  . Emesis    HPI Kirk [name] is a 57 y.o. male.  HPI  The patient is a 57 year old male, he has a history of end-stage renal disease on dialysis, he also has a history of pulmonary edema, asthma, hypertension, prior kidney transplant which failed and a history of pneumonia as well.  He reports that yesterday he went to dialysis, he was feeling his baseline but started to develop a severe headache and has had vomiting since that time.  He has had multiple episodes of vomiting yesterday as well as today, he states that his headache was severe earlier today though it seems to have backed off a little bit.  He has had difficulty ambulating, his wife had to help him walk to the car but he was able to assist with that.  She states that he has been increasingly sleepy but there is been no fevers, no diarrhea, he has had a small amount of blood intermittently in his urine.  He makes very little urine only a couple of drops a day.  There has been no abdominal pain, he denies chest pain, he denies shortness of breath despite appearing mildly tachypneic.  There is been no head injuries, no neck pain or stiffness, she has been giving him Zofran without any improvement at home.  He did finish his complete dialysis session yesterday.  He does report that he is having a shooting pain in his left arm over the fistula in the forearm but denies any redness swelling or tenderness when he touches it.  Past Medical History:  Diagnosis Date  . Acute edema of lung, unspecified   . Acute, but ill-defined, cerebrovascular disease   . Allergy   . Anemia   . 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)   . Anxiety   . Asthma   . Carpal tunnel syndrome   . Cellulitis and abscess of trunk   . Cholelithiasis 07/13/2014  . Chronic headaches     . Debility, unspecified   . Dermatophytosis of the body   . Dysrhythmia    history of  . Edema   . End stage renal disease on dialysis [facility])    "MWF; [facility]" (10/21/2014)  . Essential hypertension, benign   . GERD (gastroesophageal reflux disease)   . Gout, unspecified   . HTN (hypertension)   . Hypertrophy of prostate without urinary obstruction and other lower urinary tract symptoms (LUTS)   . Hypotension, unspecified   . Impotence of organic origin   . Insomnia, unspecified   . Kidney replaced by transplant   . Localization-related (focal) (partial) epilepsy and epileptic syndromes with complex partial seizures, without mention of intractable epilepsy   . Lumbago   . Memory loss   . OSA on CPAP   . Other and unspecified hyperlipidemia   . Other chronic nonalcoholic liver disease   . Other malaise and fatigue   . Other nonspecific abnormal serum enzyme levels   . Pain in joint, lower leg   . Pain in joint, upper arm   . Pneumonia "several times"  . Renal dialysis status(V45.11) 02/05/2010   restarted 01/02/13 ofter renal trransplant failure  . Secondary hyperparathyroidism (of renal origin)   . Shortness of breath   . Sleep apnea   . Tension headache   .  Unspecified constipation   . Unspecified essential hypertension   . Unspecified hereditary and idiopathic peripheral neuropathy   . Unspecified vitamin D deficiency     Patient Active Problem List   Diagnosis Date Noted  . Acute respiratory failure with hypoxia ([place]) 01/28/2017  . Sepsis ([place]) 01/28/2017  . 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  . Hyponatremia   . Acute respiratory failure with hypoxemia ([place]) 10/31/2016  . Clostridium difficile enteritis 10/31/2016  . Pulmonary edema 09/13/2016  . Shunt malfunction 07/03/2016  . Nausea without vomiting 12/14/2015  . End stage renal disease ([place])   . Palpitations 06/29/2015  . Need for hepatitis C screening test 06/29/2015  . Fatigue 06/29/2015  .  Myalgia 05/17/2015  . 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 Obesity hypoventilation syndrome ([place]) 12/09/2014  . Extrinsic asthma 12/09/2014  . Narcotic drug use 12/09/2014  . Hypersomnia with sleep apnea 12/09/2014  . SCC (squamous cell carcinoma), arm 11/09/2014  . Mild persistent chronic asthma without complication 62/83/6629  . ESRD on dialysis ([place]) 09/09/2014  . HLD (hyperlipidemia) 09/09/2014  . Chest pain 09/09/2014  . Dyspnea 06/23/2014  . Pancytopenia ([place]) 04/28/2014  . Thrombocytopenia ([place]) 04/10/2014  . HCAP (healthcare-associated pneumonia) 04/07/2014  . Fungal dermatitis 03/17/2014  . Abdominal pain   . History of surgical procedure 09/17/2013  . Headache(784.0) 04/28/2013  . Awaiting organ transplant 02/13/2013  . HTN (hypertension)   . DM (diabetes mellitus), type 2 with renal complications ([place])   . Tension headache   . Memory loss   . Anemia in chronic renal disease   . Edema   . Pain in joint, lower leg 05/08/2012  . Thoracic or lumbosacral neuritis or radiculitis 03/27/2012  . Nerve root pain 03/27/2012  . History of kidney transplant 09/10/2011  . Hearing loss 08/16/2011  . Obesity 08/16/2011  . Difficulty hearing 08/16/2011  . OSA on CPAP 08/05/2011    Past Surgical History:  Procedure Laterality Date  . AV FISTULA PLACEMENT Left ?2010   "forearm; at [facility]"  . BACK SURGERY    . CARDIAC CATHETERIZATION  03/21/2011  . CHOLECYSTECTOMY N/A 10/21/2014   Procedure: LAPAROSCOPIC CHOLECYSTECTOMY WITH INTRAOPERATIVE CHOLANGIOGRAM;  Surgeon: Autumn Messing III, MD;  Location: [facility];  Service: General;  Laterality: N/A;  . COLONOSCOPY    . 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 KIDNEY TRANSPLANT  08/17/2011   [place]  10/21/2014   w/IOC  . LEFT HEART CATHETERIZATION WITH CORONARY ANGIOGRAM N/A 03/21/2011   Procedure: LEFT HEART CATHETERIZATION WITH CORONARY ANGIOGRAM;  Surgeon: [name], MD;  Location: [facility] CATH LAB;  Service: Cardiovascular;  Laterality: N/A;  . NEPHRECTOMY  08/2013   removed transplaned kidney  . POSTERIOR FUSION CERVICAL SPINE  06/25/2012   for spinal stenosis  . VASECTOMY  2010       Home Medications    Prior to Admission medications   Medication Sig Start Date End Date Taking? Authorizing Provider  acetaminophen (TYLENOL) 500 MG tablet Take 1 tablet (500 mg total) by mouth every 6 (six) hours as needed for moderate pain. 09/12/14  Yes Samuella Cota, MD  aspirin EC 81 MG tablet Take 81 mg by mouth daily.   Yes [provider]  b complex-vitamin c-folic acid (NEPHRO-VITE) 0.8 MG TABS tablet TAKE 1 TABLET BY MOUTH EVERY DAY 12/03/16  Yes Reed, Tiffany L, DO  budesonide-formoterol (SYMBICORT) 80-4.5 MCG/ACT inhaler Inhale 2 puffs into the lungs 2 (  two) times daily. 01/30/17  Yes Shahmehdi, Valeria Batman, MD  calcitRIOL (ROCALTROL) 0.25 MCG capsule Take 1 capsule (0.25 mcg total) by mouth every Monday, Wednesday, and Friday with hemodialysis. 09/21/14  Yes Bonnielee Haff, MD  calcium acetate (PHOSLO) 667 MG capsule Take by mouth 3 (three) times daily with meals. Take 4 capsule with snacks and 8 capsules with meals.   Yes [provider]  Cholecalciferol (VITAMIN D3) 5000 UNITS TABS Take 1 tablet by mouth daily.   Yes [provider]  cinacalcet (SENSIPAR) 30 MG tablet Take 30 mg by mouth daily. With dinner.   Yes [provider]  clonazePAM (KLONOPIN) 0.5 MG tablet Take 1 tablet (0.5 mg total) by mouth 2 (two) times daily. Patient taking differently: Take 0.5 mg by mouth 2 (two) times daily as needed.  09/22/16  Yes Orvan Falconer, MD  diphenhydrAMINE (BENADRYL) 25 MG tablet Take 25 mg by mouth at bedtime as needed for allergies.   Yes [provider]  guaiFENesin (MUCINEX) 600 MG 12 hr tablet Take 600 mg by mouth 2 (two) times daily.   Yes [provider]  guaiFENesin-dextromethorphan (ROBITUSSIN DM) 100-10  MG/5ML syrup Take 10 mLs by mouth every 4 (four) hours as needed for cough.   Yes [provider]  hydrALAZINE (APRESOLINE) 50 MG tablet Take 1 tablet (50 mg total) by mouth every 8 (eight) hours. Patient taking differently: Take 50 mg by mouth daily as needed. Only takes when BP levels exceed 140/80 09/22/16  Yes Orvan Falconer, MD  hydrocortisone (ANUSOL-HC) 2.5 % rectal cream Place 1 application rectally at bedtime. Application full inside rectum at bedtime for 7 days. 02/05/17  Yes Willia Craze, NP  metoprolol tartrate (LOPRESSOR) 50 MG tablet Take One and a half tablet by mouth twice daily Patient taking differently: Take 75 mg by mouth 2 (two) times daily. Take One and a half tablet by mouth twice daily 10/22/16  Yes Reed, Tiffany L, DO  omeprazole (PRILOSEC) 20 MG capsule TAKE 1 CAPSULE BY MOUTH 2 TIMES DAILY BEFORE A MEAL. 12/26/16  Yes Milus Banister, MD  ondansetron (ZOFRAN ODT) 4 MG disintegrating tablet Take 1 tablet (4 mg total) by mouth every 8 (eight) hours as needed for nausea or vomiting. 10/26/16  Yes Carmon Sails J, PA-C  pravastatin (PRAVACHOL) 40 MG tablet TAKE 1 TABLET EVERY EVENING 01/03/17  Yes Reed, Tiffany L, DO  traMADol (ULTRAM) 50 MG tablet Take 1 tablet (50 mg total) by mouth daily as needed (severe headache). 10/08/16  Yes Reed, Tiffany L, DO  cephALEXin (KEFLEX) 500 MG capsule Take 1 capsule (500 mg total) by mouth daily for 7 days. Take after dialysis 02/19/17 02/26/17  Noemi Chapel, MD    Family History Family History  Adopted: Yes  Problem Relation Age of Onset  . Colon cancer Neg Hx   . Esophageal cancer Neg Hx   . Rectal cancer Neg Hx   . Stomach cancer Neg Hx     Social History Social History   Tobacco Use  . Smoking status: Current Every Day Smoker    Packs/day: 0.50    Years: 32.00    Pack years: 16.00    Types: E-cigarettes    Last attempt to quit: 08/23/2014    Years since quitting: 2.4  . Smokeless tobacco: Never Used  Substance Use  Topics  . Alcohol use: No    Alcohol/week: 0.0 oz  . Drug use: No     Allergies   Codeine  Review of Systems Review of Systems  All other systems reviewed and are negative.    Physical Exam Updated Vital Signs BP (!) 172/87 (BP Location: Right Arm)   Pulse 74   Temp 98.5 F (36.9 C)   Resp 12   Ht 5\' 7"  (1.702 m)   Wt 103.4 kg (228 lb)   SpO2 96%   BMI 35.71 kg/m   Physical Exam  Constitutional: He appears well-developed and well-nourished.  HENT:  Head: Normocephalic and atraumatic.  Mouth/Throat: Oropharynx is clear and moist. No oropharyngeal exudate.  Eyes: Conjunctivae and EOM are normal. Pupils are equal, round, and reactive to light. Right eye exhibits no discharge. Left eye exhibits no discharge. No scleral icterus.  Normal extraocular movements, the patient's visual acuity is grossly normal, his pupillary's exam is normal, they are symmetrical and reactive, oropharynx is clear and moist, poor dentition  Neck: Normal range of motion. Neck supple. No JVD present. No thyromegaly present.  Very supple neck, no lymphadenopathy  Cardiovascular: Normal rate, regular rhythm, normal heart sounds and intact distal pulses. Exam reveals no gallop and no friction rub.  No murmur heard. Normal heart rate, good thrill in the fistula of the left forearm, no overlying redness or tenderness, no JVD, very soft systolic murmur  Pulmonary/Chest: Effort normal. No respiratory distress. He has wheezes. He has no rales.  Widely tachypneic but no abnormal lung sounds other than mild expiratory wheezing  Abdominal: Soft. Bowel sounds are normal. He exhibits no distension and no mass. There is no tenderness.  Very soft nontender abdomen diffusely  Musculoskeletal: Normal range of motion. He exhibits no edema or tenderness.  No edema of the lower extremities  Lymphadenopathy:    He has no cervical adenopathy.  Neurological: He is alert. Coordination normal.  The patient is altered  and that he is keeping his eyes closed, he is mildly somnolent, he does speak and he answers questions appropriately but it is sometimes difficult to redirect or to get him to answer a clear question.  He is able to follow all my commands, when he stands up at the wheelchair he appears diffusely weak and collapses onto the bed  Skin: Skin is warm and dry. No rash noted. No erythema.  Psychiatric: He has a normal mood and affect. His behavior is normal.  Nursing note and vitals reviewed.    ED Treatments / Results  Labs (all labs ordered are listed, but only abnormal results are displayed) Labs Reviewed  COMPREHENSIVE METABOLIC PANEL - Abnormal; Notable for the following components:      Result Value   Chloride 93 (*)    Glucose, Bld 143 (*)    BUN 34 (*)    Creatinine, Ser 11.04 (*)    GFR calc non Af Amer 5 (*)    GFR calc Af Amer 5 (*)    Anion gap 18 (*)    All other components within normal limits  CBC - Abnormal; Notable for the following components:   RBC 3.52 (*)    Hemoglobin 10.8 (*)    HCT 33.5 (*)    Platelets 113 (*)    All other components within normal limits  URINALYSIS, ROUTINE W REFLEX MICROSCOPIC - Abnormal; Notable for the following components:   APPearance HAZY (*)    Glucose, UA 50 (*)    Hgb urine dipstick MODERATE (*)    Protein, ur >=300 (*)    Leukocytes, UA MODERATE (*)    Bacteria, UA RARE (*)  Squamous Epithelial / LPF 0-5 (*)    All other components within normal limits  URINE CULTURE  LIPASE, BLOOD    EKG  EKG Interpretation None       Radiology Ct Head Wo Contrast  Result Date: 02/19/2017 CLINICAL DATA:  With headache, nausea EXAM: CT HEAD WITHOUT CONTRAST TECHNIQUE: Contiguous axial images were obtained from the base of the skull through the vertex without intravenous contrast. COMPARISON:  08/25/2014 FINDINGS: Brain: No acute intracranial abnormality. Specifically, no hemorrhage, hydrocephalus, mass lesion, acute infarction, or  significant intracranial injury. Vascular: No hyperdense vessel or unexpected calcification. Skull: No acute calvarial abnormality. Sinuses/Orbits: Visualized paranasal sinuses and mastoids clear. Orbital soft tissues unremarkable. Other: None IMPRESSION: Normal head CT. Electronically Signed   By: Rolm Baptise M.D.   On: 02/19/2017 16:05   Dg Chest Port 1 View  Result Date: 02/19/2017 CLINICAL DATA:  Cough, shortness of breath. EXAM: PORTABLE CHEST 1 VIEW COMPARISON:  Radiographs of January 29, 2017. FINDINGS: Mild cardiomegaly is noted. No pneumothorax or pleural effusion is noted. Both lungs are clear. The visualized skeletal structures are unremarkable. IMPRESSION: No acute cardiopulmonary abnormality seen. Electronically Signed   By: Marijo Conception, M.D.   On: 02/19/2017 15:18    Procedures Procedures (including critical care time)  Medications Ordered in ED Medications  0.9 %  sodium chloride infusion ( Intravenous New Bag/Given 02/19/17 1729)  promethazine (PHENERGAN) injection 25 mg (25 mg Intramuscular Given 02/19/17 1518)  ketorolac (TORADOL) 30 MG/ML injection 30 mg (30 mg Intravenous Given 02/19/17 1706)  cefTRIAXone (ROCEPHIN) 1 g in dextrose 5 % 50 mL IVPB (0 g Intravenous Stopped 02/19/17 1729)     Initial Impression / Assessment and Plan / ED Course  I have reviewed the triage vital signs and the nursing notes.  Pertinent labs & imaging results that were available during my care of the patient were reviewed by me and considered in my medical decision making (see chart for details).     The patient has had vomiting, there is been headache, he will need a CT scan of the brain to evaluate for possible causes, his labs thus far have not shown any definite etiologies.  He will also need a urinalysis and a chest x-ray.  The patient has improved significantly after medications, discussed with pharmacy regarding dosing of the cephalexin, they recommend 500 mg p.o. daily for 7 days, to  be given after dialysis.  Patient expressed understanding, stable for discharge, will also give prescription for promethazine  Final Clinical Impressions(s) / ED Diagnoses   Final diagnoses:  Acute cystitis with hematuria  Non-intractable vomiting with nausea, unspecified vomiting type  Bad headache    ED Discharge Orders        Ordered    cephALEXin (KEFLEX) 500 MG capsule  Daily     02/19/17 1800    promethazine (PHENERGAN) 25 MG tablet  Every 6 hours PRN     02/19/17 1803       Noemi Chapel, MD 02/19/17 [phone]

## 2017-02-19 NOTE — Discharge Instructions (Signed)
Your testing today reveals that she likely has a urinary tract infection.  Please take the medications exactly as prescribed, you may return to the emergency department for worsening symptoms. Cephalexin, 500 mg daily, take this medication after dialysis. Promethazine as needed for nausea

## 2017-02-19 NOTE — ED Notes (Signed)
Pt's 2nd prescription was stapled to another patient's discharge instructions. Have called pt and left a voicemail to give [place] a call at [facility] ED.

## 2017-02-19 NOTE — ED Notes (Signed)
Pt states his nausea is much better.

## 2017-02-19 NOTE — ED Notes (Signed)
Pt stated he could walk to the car without a wheelchair. Pt ambulated with no issue

## 2017-02-19 NOTE — ED Triage Notes (Signed)
Pt c/o of n/v since last night with low grade fever.  Temp in triage 98.5. Pt took zofran at 9:15 this am.

## 2017-02-20 ENCOUNTER — Encounter: Payer: Self-pay | Admitting: *Deleted

## 2017-02-20 ENCOUNTER — Other Ambulatory Visit: Payer: Self-pay | Admitting: Pharmacist

## 2017-02-20 ENCOUNTER — Other Ambulatory Visit: Payer: Self-pay | Admitting: *Deleted

## 2017-02-20 DIAGNOSIS — N186 End stage renal disease: Secondary | ICD-10-CM | POA: Diagnosis not present

## 2017-02-20 DIAGNOSIS — D631 Anemia in chronic kidney disease: Secondary | ICD-10-CM | POA: Diagnosis not present

## 2017-02-20 DIAGNOSIS — N2581 Secondary hyperparathyroidism of renal origin: Secondary | ICD-10-CM | POA: Diagnosis not present

## 2017-02-20 NOTE — Patient Outreach (Signed)
[facility]) Care Management  02/20/2017  Kirk [name] April 16, 1960 [id number]  Case closure   RN attempted once again outreach to pt based upon an ED visit on 1/29 however remains unsuccessful. Based upon the unsuccessful contacts and no response to the outreach letter mail out earlier this month this case will be closed.  Will outreach to the hospital liaison [facility]) with update on pt's disposition with [facility] services. Will alert all involved disciplines involved with this case. Will alert primary provider and CMA for case closure.  Raina Mina, RN Care Management Coordinator [place] Office [phone]

## 2017-02-20 NOTE — Patient Outreach (Signed)
Receive [place] message from [place] letting me know that she received no response from the outreach letter that she mailed out to patient earlier this month and has closed this patient. Having also made three outreach attempts to the patient with no response, will accordingly close pharmacy episode at this time.  Harlow Asa, PharmD, [place] Management [phone]

## 2017-02-21 DIAGNOSIS — T8612 Kidney transplant failure: Secondary | ICD-10-CM | POA: Diagnosis not present

## 2017-02-21 DIAGNOSIS — N186 End stage renal disease: Secondary | ICD-10-CM | POA: Diagnosis not present

## 2017-02-21 DIAGNOSIS — Z992 Dependence on renal dialysis: Secondary | ICD-10-CM | POA: Diagnosis not present

## 2017-02-21 LAB — URINE CULTURE

## 2017-02-22 DIAGNOSIS — N2581 Secondary hyperparathyroidism of renal origin: Secondary | ICD-10-CM | POA: Diagnosis not present

## 2017-02-22 DIAGNOSIS — T8612 Kidney transplant failure: Secondary | ICD-10-CM | POA: Diagnosis not present

## 2017-02-22 DIAGNOSIS — D631 Anemia in chronic kidney disease: Secondary | ICD-10-CM | POA: Diagnosis not present

## 2017-02-22 DIAGNOSIS — N186 End stage renal disease: Secondary | ICD-10-CM | POA: Diagnosis not present

## 2017-02-22 DIAGNOSIS — D509 Iron deficiency anemia, unspecified: Secondary | ICD-10-CM | POA: Diagnosis not present

## 2017-02-22 DIAGNOSIS — Z992 Dependence on renal dialysis: Secondary | ICD-10-CM | POA: Diagnosis not present

## 2017-02-25 DIAGNOSIS — D509 Iron deficiency anemia, unspecified: Secondary | ICD-10-CM | POA: Diagnosis not present

## 2017-02-25 DIAGNOSIS — N2581 Secondary hyperparathyroidism of renal origin: Secondary | ICD-10-CM | POA: Diagnosis not present

## 2017-02-25 DIAGNOSIS — D631 Anemia in chronic kidney disease: Secondary | ICD-10-CM | POA: Diagnosis not present

## 2017-02-25 DIAGNOSIS — N186 End stage renal disease: Secondary | ICD-10-CM | POA: Diagnosis not present

## 2017-02-27 DIAGNOSIS — D631 Anemia in chronic kidney disease: Secondary | ICD-10-CM | POA: Diagnosis not present

## 2017-02-27 DIAGNOSIS — N2581 Secondary hyperparathyroidism of renal origin: Secondary | ICD-10-CM | POA: Diagnosis not present

## 2017-02-27 DIAGNOSIS — D509 Iron deficiency anemia, unspecified: Secondary | ICD-10-CM | POA: Diagnosis not present

## 2017-02-27 DIAGNOSIS — N186 End stage renal disease: Secondary | ICD-10-CM | POA: Diagnosis not present

## 2017-03-01 DIAGNOSIS — D509 Iron deficiency anemia, unspecified: Secondary | ICD-10-CM | POA: Diagnosis not present

## 2017-03-01 DIAGNOSIS — D631 Anemia in chronic kidney disease: Secondary | ICD-10-CM | POA: Diagnosis not present

## 2017-03-01 DIAGNOSIS — N2581 Secondary hyperparathyroidism of renal origin: Secondary | ICD-10-CM | POA: Diagnosis not present

## 2017-03-01 DIAGNOSIS — N186 End stage renal disease: Secondary | ICD-10-CM | POA: Diagnosis not present

## 2017-03-04 DIAGNOSIS — D509 Iron deficiency anemia, unspecified: Secondary | ICD-10-CM | POA: Diagnosis not present

## 2017-03-04 DIAGNOSIS — N186 End stage renal disease: Secondary | ICD-10-CM | POA: Diagnosis not present

## 2017-03-04 DIAGNOSIS — D631 Anemia in chronic kidney disease: Secondary | ICD-10-CM | POA: Diagnosis not present

## 2017-03-04 DIAGNOSIS — N2581 Secondary hyperparathyroidism of renal origin: Secondary | ICD-10-CM | POA: Diagnosis not present

## 2017-03-06 DIAGNOSIS — D631 Anemia in chronic kidney disease: Secondary | ICD-10-CM | POA: Diagnosis not present

## 2017-03-06 DIAGNOSIS — N2581 Secondary hyperparathyroidism of renal origin: Secondary | ICD-10-CM | POA: Diagnosis not present

## 2017-03-06 DIAGNOSIS — D509 Iron deficiency anemia, unspecified: Secondary | ICD-10-CM | POA: Diagnosis not present

## 2017-03-06 DIAGNOSIS — N186 End stage renal disease: Secondary | ICD-10-CM | POA: Diagnosis not present

## 2017-03-08 DIAGNOSIS — N186 End stage renal disease: Secondary | ICD-10-CM | POA: Diagnosis not present

## 2017-03-08 DIAGNOSIS — N2581 Secondary hyperparathyroidism of renal origin: Secondary | ICD-10-CM | POA: Diagnosis not present

## 2017-03-08 DIAGNOSIS — D509 Iron deficiency anemia, unspecified: Secondary | ICD-10-CM | POA: Diagnosis not present

## 2017-03-08 DIAGNOSIS — D631 Anemia in chronic kidney disease: Secondary | ICD-10-CM | POA: Diagnosis not present

## 2017-03-11 DIAGNOSIS — D509 Iron deficiency anemia, unspecified: Secondary | ICD-10-CM | POA: Diagnosis not present

## 2017-03-11 DIAGNOSIS — N2581 Secondary hyperparathyroidism of renal origin: Secondary | ICD-10-CM | POA: Diagnosis not present

## 2017-03-11 DIAGNOSIS — D631 Anemia in chronic kidney disease: Secondary | ICD-10-CM | POA: Diagnosis not present

## 2017-03-11 DIAGNOSIS — N186 End stage renal disease: Secondary | ICD-10-CM | POA: Diagnosis not present

## 2017-03-13 DIAGNOSIS — D631 Anemia in chronic kidney disease: Secondary | ICD-10-CM | POA: Diagnosis not present

## 2017-03-13 DIAGNOSIS — N186 End stage renal disease: Secondary | ICD-10-CM | POA: Diagnosis not present

## 2017-03-13 DIAGNOSIS — D509 Iron deficiency anemia, unspecified: Secondary | ICD-10-CM | POA: Diagnosis not present

## 2017-03-13 DIAGNOSIS — N2581 Secondary hyperparathyroidism of renal origin: Secondary | ICD-10-CM | POA: Diagnosis not present

## 2017-03-15 DIAGNOSIS — N2581 Secondary hyperparathyroidism of renal origin: Secondary | ICD-10-CM | POA: Diagnosis not present

## 2017-03-15 DIAGNOSIS — D631 Anemia in chronic kidney disease: Secondary | ICD-10-CM | POA: Diagnosis not present

## 2017-03-15 DIAGNOSIS — N186 End stage renal disease: Secondary | ICD-10-CM | POA: Diagnosis not present

## 2017-03-15 DIAGNOSIS — D509 Iron deficiency anemia, unspecified: Secondary | ICD-10-CM | POA: Diagnosis not present

## 2017-03-18 ENCOUNTER — Ambulatory Visit: Payer: Medicare Other | Admitting: Internal Medicine

## 2017-03-18 ENCOUNTER — Telehealth: Payer: Self-pay | Admitting: Internal Medicine

## 2017-03-18 DIAGNOSIS — D631 Anemia in chronic kidney disease: Secondary | ICD-10-CM | POA: Diagnosis not present

## 2017-03-18 DIAGNOSIS — N2581 Secondary hyperparathyroidism of renal origin: Secondary | ICD-10-CM | POA: Diagnosis not present

## 2017-03-18 DIAGNOSIS — N186 End stage renal disease: Secondary | ICD-10-CM | POA: Diagnosis not present

## 2017-03-18 DIAGNOSIS — D509 Iron deficiency anemia, unspecified: Secondary | ICD-10-CM | POA: Diagnosis not present

## 2017-03-18 NOTE — Telephone Encounter (Signed)
I left a message asking the pt to call me at [phone] to schedule AWV. VDM (DD)

## 2017-03-20 DIAGNOSIS — N2581 Secondary hyperparathyroidism of renal origin: Secondary | ICD-10-CM | POA: Diagnosis not present

## 2017-03-20 DIAGNOSIS — D631 Anemia in chronic kidney disease: Secondary | ICD-10-CM | POA: Diagnosis not present

## 2017-03-20 DIAGNOSIS — D509 Iron deficiency anemia, unspecified: Secondary | ICD-10-CM | POA: Diagnosis not present

## 2017-03-20 DIAGNOSIS — N186 End stage renal disease: Secondary | ICD-10-CM | POA: Diagnosis not present

## 2017-03-22 DIAGNOSIS — N186 End stage renal disease: Secondary | ICD-10-CM | POA: Diagnosis not present

## 2017-03-22 DIAGNOSIS — T8612 Kidney transplant failure: Secondary | ICD-10-CM | POA: Diagnosis not present

## 2017-03-22 DIAGNOSIS — D631 Anemia in chronic kidney disease: Secondary | ICD-10-CM | POA: Diagnosis not present

## 2017-03-22 DIAGNOSIS — Z992 Dependence on renal dialysis: Secondary | ICD-10-CM | POA: Diagnosis not present

## 2017-03-22 DIAGNOSIS — N2581 Secondary hyperparathyroidism of renal origin: Secondary | ICD-10-CM | POA: Diagnosis not present

## 2017-03-25 DIAGNOSIS — N2581 Secondary hyperparathyroidism of renal origin: Secondary | ICD-10-CM | POA: Diagnosis not present

## 2017-03-25 DIAGNOSIS — D631 Anemia in chronic kidney disease: Secondary | ICD-10-CM | POA: Diagnosis not present

## 2017-03-25 DIAGNOSIS — N186 End stage renal disease: Secondary | ICD-10-CM | POA: Diagnosis not present

## 2017-03-27 DIAGNOSIS — N186 End stage renal disease: Secondary | ICD-10-CM | POA: Diagnosis not present

## 2017-03-27 DIAGNOSIS — D631 Anemia in chronic kidney disease: Secondary | ICD-10-CM | POA: Diagnosis not present

## 2017-03-27 DIAGNOSIS — N2581 Secondary hyperparathyroidism of renal origin: Secondary | ICD-10-CM | POA: Diagnosis not present

## 2017-03-27 NOTE — Telephone Encounter (Signed)
Left antoher message asking pt to call and schedule AWV. VDM (DD)

## 2017-03-29 DIAGNOSIS — N2581 Secondary hyperparathyroidism of renal origin: Secondary | ICD-10-CM | POA: Diagnosis not present

## 2017-03-29 DIAGNOSIS — D631 Anemia in chronic kidney disease: Secondary | ICD-10-CM | POA: Diagnosis not present

## 2017-03-29 DIAGNOSIS — N186 End stage renal disease: Secondary | ICD-10-CM | POA: Diagnosis not present

## 2017-04-01 DIAGNOSIS — N2581 Secondary hyperparathyroidism of renal origin: Secondary | ICD-10-CM | POA: Diagnosis not present

## 2017-04-01 DIAGNOSIS — D631 Anemia in chronic kidney disease: Secondary | ICD-10-CM | POA: Diagnosis not present

## 2017-04-01 DIAGNOSIS — N186 End stage renal disease: Secondary | ICD-10-CM | POA: Diagnosis not present

## 2017-04-03 DIAGNOSIS — N2581 Secondary hyperparathyroidism of renal origin: Secondary | ICD-10-CM | POA: Diagnosis not present

## 2017-04-03 DIAGNOSIS — D631 Anemia in chronic kidney disease: Secondary | ICD-10-CM | POA: Diagnosis not present

## 2017-04-03 DIAGNOSIS — N186 End stage renal disease: Secondary | ICD-10-CM | POA: Diagnosis not present

## 2017-04-05 DIAGNOSIS — D631 Anemia in chronic kidney disease: Secondary | ICD-10-CM | POA: Diagnosis not present

## 2017-04-05 DIAGNOSIS — N186 End stage renal disease: Secondary | ICD-10-CM | POA: Diagnosis not present

## 2017-04-05 DIAGNOSIS — N2581 Secondary hyperparathyroidism of renal origin: Secondary | ICD-10-CM | POA: Diagnosis not present

## 2017-04-08 DIAGNOSIS — D631 Anemia in chronic kidney disease: Secondary | ICD-10-CM | POA: Diagnosis not present

## 2017-04-08 DIAGNOSIS — N2581 Secondary hyperparathyroidism of renal origin: Secondary | ICD-10-CM | POA: Diagnosis not present

## 2017-04-08 DIAGNOSIS — N186 End stage renal disease: Secondary | ICD-10-CM | POA: Diagnosis not present

## 2017-04-10 DIAGNOSIS — D631 Anemia in chronic kidney disease: Secondary | ICD-10-CM | POA: Diagnosis not present

## 2017-04-10 DIAGNOSIS — N2581 Secondary hyperparathyroidism of renal origin: Secondary | ICD-10-CM | POA: Diagnosis not present

## 2017-04-10 DIAGNOSIS — N186 End stage renal disease: Secondary | ICD-10-CM | POA: Diagnosis not present

## 2017-04-12 DIAGNOSIS — N186 End stage renal disease: Secondary | ICD-10-CM | POA: Diagnosis not present

## 2017-04-12 DIAGNOSIS — N2581 Secondary hyperparathyroidism of renal origin: Secondary | ICD-10-CM | POA: Diagnosis not present

## 2017-04-12 DIAGNOSIS — D631 Anemia in chronic kidney disease: Secondary | ICD-10-CM | POA: Diagnosis not present

## 2017-04-15 DIAGNOSIS — N2581 Secondary hyperparathyroidism of renal origin: Secondary | ICD-10-CM | POA: Diagnosis not present

## 2017-04-15 DIAGNOSIS — N186 End stage renal disease: Secondary | ICD-10-CM | POA: Diagnosis not present

## 2017-04-15 DIAGNOSIS — D631 Anemia in chronic kidney disease: Secondary | ICD-10-CM | POA: Diagnosis not present

## 2017-04-17 DIAGNOSIS — N2581 Secondary hyperparathyroidism of renal origin: Secondary | ICD-10-CM | POA: Diagnosis not present

## 2017-04-17 DIAGNOSIS — N186 End stage renal disease: Secondary | ICD-10-CM | POA: Diagnosis not present

## 2017-04-17 DIAGNOSIS — D631 Anemia in chronic kidney disease: Secondary | ICD-10-CM | POA: Diagnosis not present

## 2017-04-20 DIAGNOSIS — D631 Anemia in chronic kidney disease: Secondary | ICD-10-CM | POA: Diagnosis not present

## 2017-04-20 DIAGNOSIS — N2581 Secondary hyperparathyroidism of renal origin: Secondary | ICD-10-CM | POA: Diagnosis not present

## 2017-04-20 DIAGNOSIS — N186 End stage renal disease: Secondary | ICD-10-CM | POA: Diagnosis not present

## 2017-04-22 DIAGNOSIS — T8612 Kidney transplant failure: Secondary | ICD-10-CM | POA: Diagnosis not present

## 2017-04-22 DIAGNOSIS — N2581 Secondary hyperparathyroidism of renal origin: Secondary | ICD-10-CM | POA: Diagnosis not present

## 2017-04-22 DIAGNOSIS — Z992 Dependence on renal dialysis: Secondary | ICD-10-CM | POA: Diagnosis not present

## 2017-04-22 DIAGNOSIS — N186 End stage renal disease: Secondary | ICD-10-CM | POA: Diagnosis not present

## 2017-04-22 DIAGNOSIS — D631 Anemia in chronic kidney disease: Secondary | ICD-10-CM | POA: Diagnosis not present

## 2017-04-24 DIAGNOSIS — N2581 Secondary hyperparathyroidism of renal origin: Secondary | ICD-10-CM | POA: Diagnosis not present

## 2017-04-24 DIAGNOSIS — N186 End stage renal disease: Secondary | ICD-10-CM | POA: Diagnosis not present

## 2017-04-24 DIAGNOSIS — D631 Anemia in chronic kidney disease: Secondary | ICD-10-CM | POA: Diagnosis not present

## 2017-04-25 DIAGNOSIS — N186 End stage renal disease: Secondary | ICD-10-CM | POA: Diagnosis not present

## 2017-04-25 DIAGNOSIS — Z992 Dependence on renal dialysis: Secondary | ICD-10-CM | POA: Diagnosis not present

## 2017-04-25 DIAGNOSIS — I871 Compression of vein: Secondary | ICD-10-CM | POA: Diagnosis not present

## 2017-04-26 DIAGNOSIS — N186 End stage renal disease: Secondary | ICD-10-CM | POA: Diagnosis not present

## 2017-04-26 DIAGNOSIS — N2581 Secondary hyperparathyroidism of renal origin: Secondary | ICD-10-CM | POA: Diagnosis not present

## 2017-04-26 DIAGNOSIS — D631 Anemia in chronic kidney disease: Secondary | ICD-10-CM | POA: Diagnosis not present

## 2017-04-29 ENCOUNTER — Inpatient Hospital Stay (HOSPITAL_COMMUNITY)
Admission: EM | Admit: 2017-04-29 | Discharge: 2017-05-02 | DRG: 871 | Disposition: A | Discharge status: Home / Self Care | Payer: Medicare Other | Attending: Internal Medicine | Admitting: Internal Medicine

## 2017-04-29 ENCOUNTER — Encounter (HOSPITAL_COMMUNITY): Payer: Self-pay | Admitting: Emergency Medicine

## 2017-04-29 ENCOUNTER — Emergency Department (HOSPITAL_COMMUNITY): Payer: Medicare Other

## 2017-04-29 ENCOUNTER — Other Ambulatory Visit: Payer: Self-pay

## 2017-04-29 ENCOUNTER — Telehealth: Payer: Self-pay | Admitting: Internal Medicine

## 2017-04-29 DIAGNOSIS — Z981 Arthrodesis status: Secondary | ICD-10-CM

## 2017-04-29 DIAGNOSIS — Z9989 Dependence on other enabling machines and devices: Secondary | ICD-10-CM | POA: Diagnosis not present

## 2017-04-29 DIAGNOSIS — G4733 Obstructive sleep apnea (adult) (pediatric): Secondary | ICD-10-CM | POA: Diagnosis not present

## 2017-04-29 DIAGNOSIS — E876 Hypokalemia: Secondary | ICD-10-CM

## 2017-04-29 DIAGNOSIS — Y830 Surgical operation with transplant of whole organ as the cause of abnormal reaction of the patient, or of later complication, without mention of misadventure at the time of the procedure: Secondary | ICD-10-CM | POA: Diagnosis present

## 2017-04-29 DIAGNOSIS — Z6834 Body mass index (BMI) 34.0-34.9, adult: Secondary | ICD-10-CM

## 2017-04-29 DIAGNOSIS — K219 Gastro-esophageal reflux disease without esophagitis: Secondary | ICD-10-CM | POA: Diagnosis present

## 2017-04-29 DIAGNOSIS — G44201 Tension-type headache, unspecified, intractable: Secondary | ICD-10-CM | POA: Diagnosis present

## 2017-04-29 DIAGNOSIS — E1122 Type 2 diabetes mellitus with diabetic chronic kidney disease: Secondary | ICD-10-CM | POA: Diagnosis present

## 2017-04-29 DIAGNOSIS — E559 Vitamin D deficiency, unspecified: Secondary | ICD-10-CM | POA: Diagnosis present

## 2017-04-29 DIAGNOSIS — I120 Hypertensive chronic kidney disease with stage 5 chronic kidney disease or end stage renal disease: Secondary | ICD-10-CM | POA: Diagnosis present

## 2017-04-29 DIAGNOSIS — E8889 Other specified metabolic disorders: Secondary | ICD-10-CM | POA: Diagnosis present

## 2017-04-29 DIAGNOSIS — Z885 Allergy status to narcotic agent status: Secondary | ICD-10-CM

## 2017-04-29 DIAGNOSIS — F17290 Nicotine dependence, other tobacco product, uncomplicated: Secondary | ICD-10-CM | POA: Diagnosis present

## 2017-04-29 DIAGNOSIS — Z8701 Personal history of pneumonia (recurrent): Secondary | ICD-10-CM

## 2017-04-29 DIAGNOSIS — J449 Chronic obstructive pulmonary disease, unspecified: Secondary | ICD-10-CM | POA: Diagnosis not present

## 2017-04-29 DIAGNOSIS — A419 Sepsis, unspecified organism: Principal | ICD-10-CM

## 2017-04-29 DIAGNOSIS — Y95 Nosocomial condition: Secondary | ICD-10-CM | POA: Diagnosis present

## 2017-04-29 DIAGNOSIS — E785 Hyperlipidemia, unspecified: Secondary | ICD-10-CM | POA: Diagnosis present

## 2017-04-29 DIAGNOSIS — D631 Anemia in chronic kidney disease: Secondary | ICD-10-CM | POA: Diagnosis present

## 2017-04-29 DIAGNOSIS — R05 Cough: Secondary | ICD-10-CM | POA: Diagnosis not present

## 2017-04-29 DIAGNOSIS — J441 Chronic obstructive pulmonary disease with (acute) exacerbation: Secondary | ICD-10-CM

## 2017-04-29 DIAGNOSIS — Z79899 Other long term (current) drug therapy: Secondary | ICD-10-CM

## 2017-04-29 DIAGNOSIS — J440 Chronic obstructive pulmonary disease with acute lower respiratory infection: Secondary | ICD-10-CM | POA: Diagnosis present

## 2017-04-29 DIAGNOSIS — Z7982 Long term (current) use of aspirin: Secondary | ICD-10-CM

## 2017-04-29 DIAGNOSIS — T8612 Kidney transplant failure: Secondary | ICD-10-CM | POA: Diagnosis present

## 2017-04-29 DIAGNOSIS — E669 Obesity, unspecified: Secondary | ICD-10-CM | POA: Diagnosis present

## 2017-04-29 DIAGNOSIS — N2581 Secondary hyperparathyroidism of renal origin: Secondary | ICD-10-CM | POA: Diagnosis present

## 2017-04-29 DIAGNOSIS — I10 Essential (primary) hypertension: Secondary | ICD-10-CM | POA: Diagnosis not present

## 2017-04-29 DIAGNOSIS — F419 Anxiety disorder, unspecified: Secondary | ICD-10-CM | POA: Diagnosis present

## 2017-04-29 DIAGNOSIS — Z9852 Vasectomy status: Secondary | ICD-10-CM

## 2017-04-29 DIAGNOSIS — J189 Pneumonia, unspecified organism: Secondary | ICD-10-CM

## 2017-04-29 DIAGNOSIS — Z7951 Long term (current) use of inhaled steroids: Secondary | ICD-10-CM

## 2017-04-29 DIAGNOSIS — Z79891 Long term (current) use of opiate analgesic: Secondary | ICD-10-CM

## 2017-04-29 DIAGNOSIS — H9190 Unspecified hearing loss, unspecified ear: Secondary | ICD-10-CM | POA: Diagnosis present

## 2017-04-29 DIAGNOSIS — Z9049 Acquired absence of other specified parts of digestive tract: Secondary | ICD-10-CM

## 2017-04-29 DIAGNOSIS — J181 Lobar pneumonia, unspecified organism: Secondary | ICD-10-CM | POA: Diagnosis not present

## 2017-04-29 DIAGNOSIS — R0602 Shortness of breath: Secondary | ICD-10-CM | POA: Diagnosis not present

## 2017-04-29 DIAGNOSIS — Z992 Dependence on renal dialysis: Secondary | ICD-10-CM

## 2017-04-29 DIAGNOSIS — R0789 Other chest pain: Secondary | ICD-10-CM | POA: Diagnosis not present

## 2017-04-29 DIAGNOSIS — N186 End stage renal disease: Secondary | ICD-10-CM | POA: Diagnosis not present

## 2017-04-29 DIAGNOSIS — E1129 Type 2 diabetes mellitus with other diabetic kidney complication: Secondary | ICD-10-CM | POA: Diagnosis not present

## 2017-04-29 DIAGNOSIS — R51 Headache: Secondary | ICD-10-CM | POA: Diagnosis not present

## 2017-04-29 DIAGNOSIS — R509 Fever, unspecified: Secondary | ICD-10-CM | POA: Diagnosis present

## 2017-04-29 DIAGNOSIS — Z905 Acquired absence of kidney: Secondary | ICD-10-CM

## 2017-04-29 LAB — COMPREHENSIVE METABOLIC PANEL
ALBUMIN: 4.1 g/dL (ref 3.5–5.0)
ALT: 28 U/L (ref 17–63)
ANION GAP: 15 (ref 5–15)
AST: 29 U/L (ref 15–41)
Alkaline Phosphatase: 69 U/L (ref 38–126)
BILIRUBIN TOTAL: 0.7 mg/dL (ref 0.3–1.2)
BUN: 22 mg/dL — ABNORMAL HIGH (ref 6–20)
CALCIUM: 8.6 mg/dL — AB (ref 8.9–10.3)
CO2: 29 mmol/L (ref 22–32)
Chloride: 94 mmol/L — ABNORMAL LOW (ref 101–111)
Creatinine, Ser: 7.57 mg/dL — ABNORMAL HIGH (ref 0.61–1.24)
GFR, EST AFRICAN AMERICAN: 8 mL/min — AB (ref >60)
GFR, EST NON AFRICAN AMERICAN: 7 mL/min — AB (ref >60)
Glucose, Bld: 136 mg/dL — ABNORMAL HIGH (ref 65–99)
Potassium: 3.1 mmol/L — ABNORMAL LOW (ref 3.5–5.1)
Sodium: 138 mmol/L (ref 135–145)
TOTAL PROTEIN: 8.5 g/dL — AB (ref 6.5–8.1)

## 2017-04-29 LAB — I-STAT CG4 LACTIC ACID, ED: LACTIC ACID, VENOUS: 1.01 mmol/L (ref 0.5–1.9)

## 2017-04-29 LAB — CBC WITH DIFFERENTIAL/PLATELET
Basophils Absolute: 0 10*3/uL (ref 0.0–0.1)
Basophils Relative: 0 %
Eosinophils Absolute: 0 10*3/uL (ref 0.0–0.7)
Eosinophils Relative: 0 %
HEMATOCRIT: 31.4 % — AB (ref 39.0–52.0)
Hemoglobin: 10.3 g/dL — ABNORMAL LOW (ref 13.0–17.0)
LYMPHS ABS: 0.4 10*3/uL — AB (ref 0.7–4.0)
Lymphocytes Relative: 7 %
MCH: 31.9 pg (ref 26.0–34.0)
MCHC: 32.8 g/dL (ref 30.0–36.0)
MCV: 97.2 fL (ref 78.0–100.0)
MONO ABS: 0.6 10*3/uL (ref 0.1–1.0)
MONOS PCT: 11 %
NEUTROS ABS: 4.5 10*3/uL (ref 1.7–7.7)
Neutrophils Relative %: 82 %
Platelets: 86 10*3/uL — ABNORMAL LOW (ref 150–400)
RBC: 3.23 MIL/uL — ABNORMAL LOW (ref 4.22–5.81)
RDW: 13.8 % (ref 11.5–15.5)
WBC: 5.5 10*3/uL (ref 4.0–10.5)

## 2017-04-29 LAB — PROTIME-INR
INR: 0.98
Prothrombin Time: 12.9 seconds (ref 11.4–15.2)

## 2017-04-29 MED ORDER — IPRATROPIUM-ALBUTEROL 0.5-2.5 (3) MG/3ML IN SOLN
3.0000 mL | Freq: Four times a day (QID) | RESPIRATORY_TRACT | Status: DC
  Administered 2017-04-29 – 2017-05-02 (×10): 3 mL via RESPIRATORY_TRACT
  Filled 2017-04-29 (×11): qty 3

## 2017-04-29 MED ORDER — AZITHROMYCIN 250 MG PO TABS
500.0000 mg | ORAL_TABLET | Freq: Every day | ORAL | Status: DC
  Administered 2017-04-29 – 2017-05-01 (×3): 500 mg via ORAL
  Filled 2017-04-29 (×3): qty 2

## 2017-04-29 MED ORDER — IPRATROPIUM-ALBUTEROL 0.5-2.5 (3) MG/3ML IN SOLN
3.0000 mL | Freq: Once | RESPIRATORY_TRACT | Status: AC
  Administered 2017-04-29: 3 mL via RESPIRATORY_TRACT
  Filled 2017-04-29: qty 3

## 2017-04-29 MED ORDER — IPRATROPIUM-ALBUTEROL 0.5-2.5 (3) MG/3ML IN SOLN
3.0000 mL | RESPIRATORY_TRACT | Status: DC | PRN
Start: 2017-04-29 — End: 2017-05-02

## 2017-04-29 MED ORDER — SODIUM CHLORIDE 0.9 % IV SOLN
1.0000 g | INTRAVENOUS | Status: DC
  Administered 2017-04-29 – 2017-05-01 (×3): 1 g via INTRAVENOUS
  Filled 2017-04-29: qty 10
  Filled 2017-04-29 (×3): qty 1

## 2017-04-29 MED ORDER — PRAVASTATIN SODIUM 40 MG PO TABS
40.0000 mg | ORAL_TABLET | Freq: Every evening | ORAL | Status: DC
  Administered 2017-04-29 – 2017-05-01 (×3): 40 mg via ORAL
  Filled 2017-04-29 (×3): qty 1

## 2017-04-29 MED ORDER — VANCOMYCIN HCL 10 G IV SOLR
2000.0000 mg | Freq: Once | INTRAVENOUS | Status: AC
  Administered 2017-04-29: 2000 mg via INTRAVENOUS
  Filled 2017-04-29: qty 2000

## 2017-04-29 MED ORDER — HYDROCORTISONE 2.5 % RE CREA: 1.0000 "application " | TOPICAL_CREAM | Freq: Every day | RECTAL | Status: DC

## 2017-04-29 MED ORDER — CALCITRIOL 0.25 MCG PO CAPS
0.2500 ug | ORAL_CAPSULE | ORAL | Status: DC
  Administered 2017-05-01: 0.25 ug via ORAL
  Filled 2017-04-29: qty 1

## 2017-04-29 MED ORDER — CINACALCET HCL 30 MG PO TABS
30.0000 mg | ORAL_TABLET | Freq: Every day | ORAL | Status: DC
  Administered 2017-04-29 – 2017-05-01 (×3): 30 mg via ORAL
  Filled 2017-04-29 (×3): qty 1

## 2017-04-29 MED ORDER — HEPARIN SODIUM (PORCINE) 5000 UNIT/ML IJ SOLN
5000.0000 [IU] | Freq: Three times a day (TID) | INTRAMUSCULAR | Status: DC
  Filled 2017-04-29 (×3): qty 1

## 2017-04-29 MED ORDER — POTASSIUM CHLORIDE CRYS ER 20 MEQ PO TBCR
40.0000 meq | EXTENDED_RELEASE_TABLET | Freq: Once | ORAL | Status: AC
  Administered 2017-04-29: 40 meq via ORAL
  Filled 2017-04-29: qty 2

## 2017-04-29 MED ORDER — ACETAMINOPHEN 325 MG PO TABS
650.0000 mg | ORAL_TABLET | Freq: Once | ORAL | Status: AC
  Administered 2017-04-29: 650 mg via ORAL
  Filled 2017-04-29: qty 2

## 2017-04-29 MED ORDER — METOPROLOL TARTRATE 50 MG PO TABS
75.0000 mg | ORAL_TABLET | Freq: Two times a day (BID) | ORAL | Status: DC
  Administered 2017-04-29 – 2017-05-02 (×6): 75 mg via ORAL
  Filled 2017-04-29 (×6): qty 1

## 2017-04-29 MED ORDER — SODIUM CHLORIDE 0.9 % IV SOLN
2.0000 g | Freq: Once | INTRAVENOUS | Status: AC
  Administered 2017-04-29: 2 g via INTRAVENOUS
  Filled 2017-04-29: qty 2

## 2017-04-29 MED ORDER — ACETAMINOPHEN 500 MG PO TABS
500.0000 mg | ORAL_TABLET | Freq: Four times a day (QID) | ORAL | Status: DC | PRN
  Administered 2017-05-01: 500 mg via ORAL
  Filled 2017-04-29: qty 1

## 2017-04-29 MED ORDER — ONDANSETRON HCL 4 MG PO TABS
4.0000 mg | ORAL_TABLET | Freq: Four times a day (QID) | ORAL | Status: DC | PRN
  Administered 2017-04-30: 4 mg via ORAL
  Filled 2017-04-29: qty 1

## 2017-04-29 MED ORDER — SODIUM CHLORIDE 0.9 % IV SOLN: 1.0000 g | INTRAVENOUS | Status: DC

## 2017-04-29 MED ORDER — ONDANSETRON HCL 4 MG/2ML IJ SOLN
4.0000 mg | Freq: Four times a day (QID) | INTRAMUSCULAR | Status: DC | PRN
  Administered 2017-04-30 – 2017-05-01 (×2): 4 mg via INTRAVENOUS
  Filled 2017-04-29 (×2): qty 2

## 2017-04-29 MED ORDER — DIPHENHYDRAMINE HCL 25 MG PO CAPS: 25.0000 mg | ORAL_CAPSULE | Freq: Every evening | ORAL | Status: DC | PRN

## 2017-04-29 MED ORDER — VITAMIN D 1000 UNITS PO TABS
5000.0000 [IU] | ORAL_TABLET | Freq: Every day | ORAL | Status: DC
  Administered 2017-04-29 – 2017-05-02 (×4): 5000 [IU] via ORAL
  Filled 2017-04-29 (×4): qty 5

## 2017-04-29 MED ORDER — PANTOPRAZOLE SODIUM 40 MG PO TBEC
40.0000 mg | DELAYED_RELEASE_TABLET | Freq: Every day | ORAL | Status: DC
  Administered 2017-04-29 – 2017-05-02 (×4): 40 mg via ORAL
  Filled 2017-04-29 (×4): qty 1

## 2017-04-29 MED ORDER — CLONAZEPAM 0.5 MG PO TABS
0.5000 mg | ORAL_TABLET | Freq: Two times a day (BID) | ORAL | Status: DC | PRN
  Administered 2017-05-01: 0.5 mg via ORAL
  Filled 2017-04-29: qty 1

## 2017-04-29 MED ORDER — MOMETASONE FURO-FORMOTEROL FUM 100-5 MCG/ACT IN AERO
2.0000 | INHALATION_SPRAY | Freq: Two times a day (BID) | RESPIRATORY_TRACT | Status: DC
  Administered 2017-04-29 – 2017-04-30 (×3): 2 via RESPIRATORY_TRACT
  Filled 2017-04-29: qty 8.8

## 2017-04-29 MED ORDER — ASPIRIN EC 81 MG PO TBEC
81.0000 mg | DELAYED_RELEASE_TABLET | Freq: Every day | ORAL | Status: DC
  Administered 2017-04-29 – 2017-05-02 (×4): 81 mg via ORAL
  Filled 2017-04-29 (×4): qty 1

## 2017-04-29 MED ORDER — TRAMADOL HCL 50 MG PO TABS
100.0000 mg | ORAL_TABLET | Freq: Every day | ORAL | Status: DC | PRN
  Administered 2017-05-01: 100 mg via ORAL
  Filled 2017-04-29: qty 2

## 2017-04-29 MED ORDER — CALCIUM ACETATE (PHOS BINDER) 667 MG PO CAPS
667.0000 mg | ORAL_CAPSULE | Freq: Three times a day (TID) | ORAL | Status: DC
  Administered 2017-04-29 – 2017-05-02 (×8): 667 mg via ORAL
  Filled 2017-04-29 (×20): qty 1

## 2017-04-29 MED ORDER — GUAIFENESIN-DM 100-10 MG/5ML PO SYRP: 10.0000 mL | ORAL_SOLUTION | ORAL | Status: DC | PRN

## 2017-04-29 NOTE — Progress Notes (Signed)
Pharmacy Note:  Initial antibiotic(s) regimen of Vancomycin and Cefepime ordered by EDP to treat Pneumonia.  CrCl cannot be calculated (Patient's most recent lab result is older than the maximum 21 days allowed.).   Allergies  Allergen Reactions  . Codeine Nausea And Vomiting and Nausea Only    Altered mental status    Vitals:   04/29/17 1108 04/29/17 1153  BP: (!) 159/90   Pulse: 96   Resp: 20   Temp: (!) 103 F (39.4 C) (!) 102.5 F (39.2 C)  SpO2: 91%     Anti-infectives (From admission, onward)   Start     Dose/Rate Route Frequency Ordered Stop   04/29/17 1300  vancomycin (VANCOCIN) 2,000 mg in sodium chloride 0.9 % 500 mL IVPB     2,000 mg 250 mL/hr over 120 Minutes Intravenous  Once 04/29/17 1237     04/29/17 1245  ceFEPIme (MAXIPIME) 2 g in sodium chloride 0.9 % 100 mL IVPB     2 g 200 mL/hr over 30 Minutes Intravenous  Once 04/29/17 1237        Plan: Initial dose(s) of Vancomycin 2gm and Cefepime 2gm X 1 ordered. F/U admission orders for further dosing if therapy continued.  Isac Sarna, BS [place], [place] Clinical Pharmacist Pager [phone] 04/29/2017 12:38 PM

## 2017-04-29 NOTE — Progress Notes (Signed)
Pharmacy Antibiotic Note  Kirk [name] is a 57 y.o. male admitted on 04/29/2017 with CAP.  Pharmacy has been consulted for ceftriaxone dosing.  Plan: Ceftriaxone 1000 mg IV q24 hours  Patient also has azithromycin 500 mg PO daily ordered Monitor labs, c/s, and patient improvment  Height: 5\' 7"  (170.2 cm) Weight: 215 lb 2.7 oz (97.6 kg) IBW/kg (Calculated) : 66.1  Temp (24hrs), Avg:101.3 F (38.5 C), Min:99.5 F (37.5 C), Max:103 F (39.4 C)  Recent Labs  Lab 04/29/17 1134 04/29/17 1138  WBC 5.5  --   CREATININE 7.57*  --   LATICACIDVEN  --  1.01    Estimated Creatinine Clearance: 12.3 mL/min (A) (by C-G formula based on SCr of 7.57 mg/dL (H)).    Allergies  Allergen Reactions  . Codeine Nausea And Vomiting and Nausea Only    Altered mental status    Antimicrobials this admission: Azithromcyin 4/8 >>  Ceftriaxone 4/8 >>   Dose adjustments this admission: N/A  Microbiology results: 4/8 BCx: pending 4/8 UCx: pending  4/8 Sputum: pending    Thank you for allowing pharmacy to be a part of this patient's care.  Ramond Craver 04/29/2017 5:00 PM

## 2017-04-29 NOTE — ED Triage Notes (Signed)
Pt had dialysis today. Complaining of sob/wheezing since weekend.

## 2017-04-29 NOTE — Telephone Encounter (Signed)
Left message asking the pt to call me at [phone] to schedule AWV w/ nurse. VDM (DD)

## 2017-04-29 NOTE — ED Notes (Signed)
Patient is a current dialysis patient with diminished urine production.

## 2017-04-29 NOTE — ED Provider Notes (Addendum)
[facility] Provider Note   CSN: [id number] Arrival date & time: 04/29/17  1040     History   Chief Complaint Chief Complaint  Patient presents with  . Shortness of Breath    HPI Kirk [name] is a 57 y.o. male with a past medical history as outlined below significant for DM, ESRD on dialysis with last completed before arriving here, asthma and HTN presenting with a 2 day history of cough with purulent sputum production, shortness of breath with wheezing along with generalized body aches, fatigue, reduced appetite and subjective fever (wife states temp measurements at home have been normal range) but with temp of 103 upon arrival here. He denies sore throat, n/v/d or abdominal pain.  He does endorse headache and mid sternal to left sided CP described as sharp and triggered by cough. He had an albuterol neb prior to going to dialysis today with some improved symptoms.  The history is provided by the patient.    Past Medical History:  Diagnosis Date  . Acute edema of lung, unspecified   . Acute, but ill-defined, cerebrovascular disease   . Allergy   . Anemia   . 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)   . Anxiety   . Asthma   . Carpal tunnel syndrome   . Cellulitis and abscess of trunk   . Cholelithiasis 07/13/2014  . Chronic headaches   . Debility, unspecified   . Dermatophytosis of the body   . Dysrhythmia    history of  . Edema   . End stage renal disease on dialysis [facility])    "MWF; [facility]" (10/21/2014)  . Essential hypertension, benign   . GERD (gastroesophageal reflux disease)   . Gout, unspecified   . HTN (hypertension)   . Hypertrophy of prostate without urinary obstruction and other lower urinary tract symptoms (LUTS)   . Hypotension, unspecified   . Impotence of organic origin   . Insomnia, unspecified   . Kidney replaced by transplant   . Localization-related (focal) (partial) epilepsy and epileptic syndromes with  complex partial seizures, without mention of intractable epilepsy   . Lumbago   . Memory loss   . OSA on CPAP   . Other and unspecified hyperlipidemia   . Other chronic nonalcoholic liver disease   . Other malaise and fatigue   . Other nonspecific abnormal serum enzyme levels   . Pain in joint, lower leg   . Pain in joint, upper arm   . Pneumonia "several times"  . Renal dialysis status(V45.11) 02/05/2010   restarted 01/02/13 ofter renal trransplant failure  . Secondary hyperparathyroidism (of renal origin)   . Shortness of breath   . Sleep apnea   . Tension headache   . Unspecified constipation   . Unspecified essential hypertension   . Unspecified hereditary and idiopathic peripheral neuropathy   . Unspecified vitamin D deficiency     Patient Active Problem List   Diagnosis Date Noted  . Fever 04/29/2017  . Acute respiratory failure with hypoxia ([place]) 01/28/2017  . Sepsis ([place]) 01/28/2017  . 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  . Hyponatremia   . Acute respiratory failure with hypoxemia ([place]) 10/31/2016  . Clostridium difficile enteritis 10/31/2016  . Pulmonary edema 09/13/2016  . Shunt malfunction 07/03/2016  . Nausea without vomiting 12/14/2015  . End stage renal disease ([place])   . Palpitations 06/29/2015  . Need for hepatitis C screening test 06/29/2015  . Fatigue 06/29/2015  . Myalgia 05/17/2015  . Secondary  central sleep apnea 12/09/2014  . Obesity hypoventilation syndrome ([place]) 12/09/2014  . Extrinsic asthma 12/09/2014  . Narcotic drug use 12/09/2014  . Hypersomnia with sleep apnea 12/09/2014  . SCC (squamous cell carcinoma), arm 11/09/2014  . Mild persistent chronic asthma without complication 79/02/4095  . ESRD on dialysis ([place]) 09/09/2014  . HLD (hyperlipidemia) 09/09/2014  . Chest pain 09/09/2014  . Dyspnea 06/23/2014  . Pancytopenia ([place]) 04/28/2014  . Thrombocytopenia ([place]) 04/10/2014  . HCAP (healthcare-associated pneumonia) 04/07/2014  . Fungal dermatitis  03/17/2014  . Abdominal pain   . History of surgical procedure 09/17/2013  . Headache(784.0) 04/28/2013  . Awaiting organ transplant 02/13/2013  . HTN (hypertension)   . DM (diabetes mellitus), type 2 with renal complications ([place])   . Tension headache   . Memory loss   . Anemia in chronic renal disease   . Edema   . Pain in joint, lower leg 05/08/2012  . Thoracic or lumbosacral neuritis or radiculitis 03/27/2012  . Nerve root pain 03/27/2012  . History of kidney transplant 09/10/2011  . Hearing loss 08/16/2011  . Obesity 08/16/2011  . Difficulty hearing 08/16/2011  . OSA on CPAP 08/05/2011    Past Surgical History:  Procedure Laterality Date  . AV FISTULA PLACEMENT Left ?2010   "forearm; at [facility]"  . BACK SURGERY    . CARDIAC CATHETERIZATION  03/21/2011  . CHOLECYSTECTOMY N/A 10/21/2014   Procedure: LAPAROSCOPIC CHOLECYSTECTOMY WITH INTRAOPERATIVE CHOLANGIOGRAM;  Surgeon: Autumn Messing III, MD;  Location: [place];  Service: General;  Laterality: N/A;  . COLONOSCOPY    . 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 KIDNEY TRANSPLANT  08/17/2011   [place]  10/21/2014   w/IOC  . LEFT HEART CATHETERIZATION WITH CORONARY ANGIOGRAM N/A 03/21/2011   Procedure: LEFT HEART CATHETERIZATION WITH CORONARY ANGIOGRAM;  Surgeon: [name], MD;  Location: [facility] CATH LAB;  Service: Cardiovascular;  Laterality: N/A;  . NEPHRECTOMY  08/2013   removed transplaned kidney  . POSTERIOR FUSION CERVICAL SPINE  06/25/2012   for spinal stenosis  . VASECTOMY  2010        Home Medications    Prior to Admission medications   Medication Sig Start Date End Date Taking? Authorizing Provider  acetaminophen (TYLENOL) 500 MG tablet Take 1 tablet (500 mg total) by mouth every 6 (six) hours as needed for moderate pain. 09/12/14  Yes Samuella Cota, MD  aspirin EC 81 MG tablet Take 81 mg by mouth daily.   Yes [provider]  b  complex-vitamin c-folic acid (NEPHRO-VITE) 0.8 MG TABS tablet TAKE 1 TABLET BY MOUTH EVERY DAY 12/03/16  Yes Reed, Tiffany L, DO  budesonide-formoterol (SYMBICORT) 80-4.5 MCG/ACT inhaler Inhale 2 puffs into the lungs 2 (two) times daily. 01/30/17  Yes Shahmehdi, Valeria Batman, MD  calcitRIOL (ROCALTROL) 0.25 MCG capsule Take 1 capsule (0.25 mcg total) by mouth every Monday, Wednesday, and Friday with hemodialysis. 09/21/14  Yes Bonnielee Haff, MD  calcium acetate (PHOSLO) 667 MG capsule Take by mouth 3 (three) times daily with meals. Take 4 capsule with snacks and 8 capsules with meals.   Yes [provider]  Cholecalciferol (VITAMIN D3) 5000 UNITS TABS Take 1 tablet by mouth daily.   Yes [provider]  cinacalcet (SENSIPAR) 30 MG tablet Take 30 mg by mouth daily. With dinner.   Yes [provider]  clonazePAM (KLONOPIN) 0.5 MG tablet Take 1 tablet (0.5 mg total) by mouth  2 (two) times daily. Patient taking differently: Take 0.5 mg by mouth 2 (two) times daily as needed.  09/22/16  Yes Orvan Falconer, MD  diphenhydrAMINE (BENADRYL) 25 MG tablet Take 25 mg by mouth at bedtime as needed for allergies.   Yes [provider]  guaiFENesin (MUCINEX) 600 MG 12 hr tablet Take 600 mg by mouth 2 (two) times daily.   Yes [provider]  guaiFENesin-dextromethorphan (ROBITUSSIN DM) 100-10 MG/5ML syrup Take 10 mLs by mouth every 4 (four) hours as needed for cough.   Yes [provider]  hydrALAZINE (APRESOLINE) 50 MG tablet Take 1 tablet (50 mg total) by mouth every 8 (eight) hours. Patient taking differently: Take 50 mg by mouth daily as needed. Only takes when BP levels exceed 140/80 09/22/16  Yes Orvan Falconer, MD  hydrocortisone (ANUSOL-HC) 2.5 % rectal cream Place 1 application rectally at bedtime. Application full inside rectum at bedtime for 7 days. 02/05/17  Yes Willia Craze, NP  metoprolol tartrate (LOPRESSOR) 50 MG tablet Take One and a half tablet by mouth twice  daily Patient taking differently: Take 75 mg by mouth 2 (two) times daily. Take One and a half tablet by mouth twice daily 10/22/16  Yes Reed, Tiffany L, DO  omeprazole (PRILOSEC) 20 MG capsule TAKE 1 CAPSULE BY MOUTH 2 TIMES DAILY BEFORE A MEAL. 12/26/16  Yes Milus Banister, MD  ondansetron (ZOFRAN ODT) 4 MG disintegrating tablet Take 1 tablet (4 mg total) by mouth every 8 (eight) hours as needed for nausea or vomiting. 10/26/16  Yes Carmon Sails J, PA-C  pravastatin (PRAVACHOL) 40 MG tablet TAKE 1 TABLET EVERY EVENING 01/03/17  Yes Reed, Tiffany L, DO  traMADol (ULTRAM) 50 MG tablet Take 1 tablet (50 mg total) by mouth daily as needed (severe headache). Patient taking differently: Take 100 mg by mouth daily as needed (severe headache).  10/08/16  Yes Reed, Tiffany L, DO  promethazine (PHENERGAN) 25 MG tablet Take 1 tablet (25 mg total) by mouth every 6 (six) hours as needed for nausea or vomiting. Patient not taking: Reported on 04/29/2017 02/19/17   Noemi Chapel, MD    Family History Family History  Adopted: Yes  Problem Relation Age of Onset  . Colon cancer Neg Hx   . Esophageal cancer Neg Hx   . Rectal cancer Neg Hx   . Stomach cancer Neg Hx     Social History Social History   Tobacco Use  . Smoking status: Current Every Day Smoker    Packs/day: 0.50    Years: 32.00    Pack years: 16.00    Types: E-cigarettes    Last attempt to quit: 08/23/2014    Years since quitting: 2.6  . Smokeless tobacco: Never Used  Substance Use Topics  . Alcohol use: No    Alcohol/week: 0.0 oz  . Drug use: No     Allergies   Codeine   Review of Systems Review of Systems  Constitutional: Positive for chills, fatigue and fever.  HENT: Negative for congestion and sore throat.   Eyes: Negative.   Respiratory: Positive for chest tightness, shortness of breath and wheezing.   Cardiovascular: Positive for chest pain.  Gastrointestinal: Negative for abdominal pain, nausea and vomiting.    Musculoskeletal: Positive for myalgias. Negative for arthralgias, joint swelling and neck pain.  Skin: Negative.  Negative for rash and wound.  Neurological: Positive for headaches. Negative for dizziness, weakness, light-headedness and numbness.  Psychiatric/Behavioral: Negative.      Physical  Exam Updated Vital Signs BP (!) 157/71 (BP Location: Right Arm)   Pulse 87   Temp 99.5 F (37.5 C) (Oral)   Resp 18   Ht 5\' 7"  (1.702 m)   Wt 97.6 kg (215 lb 2.7 oz)   SpO2 97%   BMI 33.70 kg/m   Physical Exam  Constitutional: He appears well-developed and well-nourished.  HENT:  Head: Normocephalic and atraumatic.  Eyes: Conjunctivae are normal.  Neck: Normal range of motion.  Cardiovascular: Normal rate, regular rhythm, normal heart sounds and intact distal pulses.  Pulmonary/Chest: Effort normal. He has decreased breath sounds. He has no wheezes. He has rhonchi. He has no rales.  Diffuse rhonchi, reduced breath sounds throughout.   Abdominal: Soft. Bowel sounds are normal. There is no tenderness.  Musculoskeletal: Normal range of motion.  Neurological: He is alert.  Skin: Skin is warm and dry.  Psychiatric: He has a normal mood and affect.  Nursing note and vitals reviewed.    ED Treatments / Results  Labs (all labs ordered are listed, but only abnormal results are displayed) Labs Reviewed  COMPREHENSIVE METABOLIC PANEL - Abnormal; Notable for the following components:      Result Value   Potassium 3.1 (*)    Chloride 94 (*)    Glucose, Bld 136 (*)    BUN 22 (*)    Creatinine, Ser 7.57 (*)    Calcium 8.6 (*)    Total Protein 8.5 (*)    GFR calc non Af Amer 7 (*)    GFR calc Af Amer 8 (*)    All other components within normal limits  CBC WITH DIFFERENTIAL/PLATELET - Abnormal; Notable for the following components:   RBC 3.23 (*)    Hemoglobin 10.3 (*)    HCT 31.4 (*)    Platelets 86 (*)    Lymphs Abs 0.4 (*)    All other components within normal limits  CULTURE,  BLOOD (ROUTINE X 2)  CULTURE, BLOOD (ROUTINE X 2)  URINE CULTURE  CULTURE, EXPECTORATED SPUTUM-ASSESSMENT  PROTIME-INR  URINALYSIS, ROUTINE W REFLEX MICROSCOPIC  CBC  CREATININE, SERUM  I-STAT CG4 LACTIC ACID, ED  I-STAT CG4 LACTIC ACID, ED    EKG ED ECG REPORT   Date: 04/29/2017  Rate: 95  Rhythm: normal sinus rhythm  QRS Axis: normal  Intervals: borderline prolonged QT  ST/T Wave abnormalities: nonspecific ST changes  Conduction Disutrbances:none  Narrative Interpretation:   Old EKG Reviewed: changes noted  I have personally reviewed the EKG tracing and agree with the computerized printout as noted.   Radiology Dg Chest 2 View  Result Date: 04/29/2017 CLINICAL DATA:  Shortness of breath and wheezing. Productive cough and fever. EXAM: CHEST - 2 VIEW COMPARISON:  One-view chest x-ray 02/19/2017 FINDINGS: The heart size is normal. Ill-defined right upper lobe airspace disease is present. Lung volumes are low. The visualized soft tissues and bony thorax are unremarkable. The cervical spine is within normal limits. IMPRESSION: 1. Ill-defined right upper lobe airspace disease consistent with pneumonia. 2. Low lung volumes without other airspace disease. Electronically Signed   By: San Morelle M.D.   On: 04/29/2017 11:50    Procedures Procedures (including critical care time)  Medications Ordered in ED    Initial Impression / Assessment and Plan / ED Course  I have reviewed the triage vital signs and the nursing notes.  Pertinent labs & imaging results that were available during my care of the patient were reviewed by me and considered in my medical  decision making (see chart for details).     Pt was given albuterol/atrovent neb with improved comfort with his breathing and improved aeration throughout all lung fields.  No wheezing.  Potassium supplement given PO. Vancomycin and maxipime given for initial tx of his pneumonia.   Discussed with Dr. Cathlean Sauer for  admission.   Final Clinical Impressions(s) / ED Diagnoses   Final diagnoses:  HCAP (healthcare-associated pneumonia)  Hypokalemia    ED Discharge Orders    None       Landis Martins 04/29/17 1627    Evalee Jefferson, PA-C 04/29/17 [place], [place], DO 05/03/17 [phone]

## 2017-04-29 NOTE — H&P (Signed)
History and Physical    KEVONTAE BURGOON JGG:[id number] DOB: Sep 09, 1960 DOA: 04/29/2017  PCP: Gayland Curry, DO   Patient coming from: Home  Chief Complaint: Fever  HPI: Kirk [name] is a 57 y.o. male with medical history significant of end stage renal disease on hemodialysis, who presents with generalized malaise for the last 48 hours.  The malaise is moderate to severe in intensity, no improving or worsening factors, it has been associated with productive cough, purulent phlegm, headache and poor appetite.  His temperature at home has been 99.  After hemodialysis today he was noted to be worse by his family members, and he was brought to the hospital for further evaluation.   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  His hemodialysis axis is a fistula on his left upper extremity.   ED Course: Patient was found to be febrile, chest x-ray showing infiltrate, he was referred for admission for evaluation.  Review of Systems:  1. General: No fevers, but chills, no weight gain or weight loss 2. ENT: No runny nose or sore throat, no hearing disturbances 3. Pulmonary: No dyspnea, but productive cough, no wheezing, or hemoptysis 4. Cardiovascular: No angina, claudication, lower extremity edema, pnd or orthopnea 5. Gastrointestinal: Positive nausea and vomiting, no diarrhea or constipation 6. Hematology: No easy bruisability or frequent infections 7. Urology: No dysuria, hematuria or increased urinary frequency 8. Dermatology: No rashes. 9. Neurology: No seizures or paresthesias 10. Musculoskeletal: No joint pain or deformities  Past Medical History:  Diagnosis Date  . Acute edema of lung, unspecified   . Acute, but ill-defined, cerebrovascular disease   . Allergy   . Anemia   . 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)   . Anxiety   . Asthma   . Carpal tunnel syndrome   . Cellulitis and abscess of trunk   . Cholelithiasis 07/13/2014  . Chronic  headaches   . Debility, unspecified   . Dermatophytosis of the body   . Dysrhythmia    history of  . Edema   . End stage renal disease on dialysis [facility])    "MWF; [facility]" (10/21/2014)  . Essential hypertension, benign   . GERD (gastroesophageal reflux disease)   . Gout, unspecified   . HTN (hypertension)   . Hypertrophy of prostate without urinary obstruction and other lower urinary tract symptoms (LUTS)   . Hypotension, unspecified   . Impotence of organic origin   . Insomnia, unspecified   . Kidney replaced by transplant   . Localization-related (focal) (partial) epilepsy and epileptic syndromes with complex partial seizures, without mention of intractable epilepsy   . Lumbago   . Memory loss   . OSA on CPAP   . Other and unspecified hyperlipidemia   . Other chronic nonalcoholic liver disease   . Other malaise and fatigue   . Other nonspecific abnormal serum enzyme levels   . Pain in joint, lower leg   . Pain in joint, upper arm   . Pneumonia "several times"  . Renal dialysis status(V45.11) 02/05/2010   restarted 01/02/13 ofter renal trransplant failure  . Secondary hyperparathyroidism (of renal origin)   . Shortness of breath   . Sleep apnea   . Tension headache   . Unspecified constipation   . Unspecified essential hypertension   . Unspecified hereditary and idiopathic peripheral neuropathy   . Unspecified vitamin D deficiency     Past Surgical History:  Procedure Laterality Date  .  AV FISTULA PLACEMENT Left ?2010   "forearm; at [facility]"  . BACK SURGERY    . CARDIAC CATHETERIZATION  03/21/2011  . CHOLECYSTECTOMY N/A 10/21/2014   Procedure: LAPAROSCOPIC CHOLECYSTECTOMY WITH INTRAOPERATIVE CHOLANGIOGRAM;  Surgeon: Autumn Messing III, MD;  Location: [place];  Service: General;  Laterality: N/A;  . COLONOSCOPY    . 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 KIDNEY TRANSPLANT  08/17/2011   [place]  10/21/2014   w/IOC  . LEFT HEART CATHETERIZATION WITH CORONARY ANGIOGRAM N/A 03/21/2011   Procedure: LEFT HEART CATHETERIZATION WITH CORONARY ANGIOGRAM;  Surgeon: [name], MD;  Location: [facility] CATH LAB;  Service: Cardiovascular;  Laterality: N/A;  . NEPHRECTOMY  08/2013   removed transplaned kidney  . POSTERIOR FUSION CERVICAL SPINE  06/25/2012   for spinal stenosis  . VASECTOMY  2010     reports that he has been smoking e-cigarettes.  He has a 16.00 pack-year smoking history. He has never used smokeless tobacco. He reports that he does not drink alcohol or use drugs.  Allergies  Allergen Reactions  . Codeine Nausea And Vomiting and Nausea Only    Altered mental status    Family History  Adopted: Yes  Problem Relation Age of Onset  . Colon cancer Neg Hx   . Esophageal cancer Neg Hx   . Rectal cancer Neg Hx   . Stomach cancer Neg Hx      Prior to Admission medications   Medication Sig Start Date End Date Taking? Authorizing Provider  acetaminophen (TYLENOL) 500 MG tablet Take 1 tablet (500 mg total) by mouth every 6 (six) hours as needed for moderate pain. 09/12/14  Yes Samuella Cota, MD  aspirin EC 81 MG tablet Take 81 mg by mouth daily.   Yes [provider]  b complex-vitamin c-folic acid (NEPHRO-VITE) 0.8 MG TABS tablet TAKE 1 TABLET BY MOUTH EVERY DAY 12/03/16  Yes Reed, Tiffany L, DO  budesonide-formoterol (SYMBICORT) 80-4.5 MCG/ACT inhaler Inhale 2 puffs into the lungs 2 (two) times daily. 01/30/17  Yes Shahmehdi, Valeria Batman, MD  calcitRIOL (ROCALTROL) 0.25 MCG capsule Take 1 capsule (0.25 mcg total) by mouth every Monday, Wednesday, and Friday with hemodialysis. 09/21/14  Yes Bonnielee Haff, MD  calcium acetate (PHOSLO) 667 MG capsule Take by mouth 3 (three) times daily with meals. Take 4 capsule with snacks and 8 capsules with meals.   Yes [provider]  Cholecalciferol (VITAMIN D3) 5000 UNITS TABS Take 1 tablet by mouth daily.   Yes  [provider]  cinacalcet (SENSIPAR) 30 MG tablet Take 30 mg by mouth daily. With dinner.   Yes [provider]  clonazePAM (KLONOPIN) 0.5 MG tablet Take 1 tablet (0.5 mg total) by mouth 2 (two) times daily. Patient taking differently: Take 0.5 mg by mouth 2 (two) times daily as needed.  09/22/16  Yes Orvan Falconer, MD  diphenhydrAMINE (BENADRYL) 25 MG tablet Take 25 mg by mouth at bedtime as needed for allergies.   Yes [provider]  guaiFENesin (MUCINEX) 600 MG 12 hr tablet Take 600 mg by mouth 2 (two) times daily.   Yes [provider]  guaiFENesin-dextromethorphan (ROBITUSSIN DM) 100-10 MG/5ML syrup Take 10 mLs by mouth every 4 (four) hours as needed for cough.   Yes [provider]  hydrALAZINE (APRESOLINE) 50 MG tablet Take 1 tablet (50 mg total) by mouth every 8 (eight) hours. Patient taking differently: Take 50 mg  by mouth daily as needed. Only takes when BP levels exceed 140/80 09/22/16  Yes Orvan Falconer, MD  hydrocortisone (ANUSOL-HC) 2.5 % rectal cream Place 1 application rectally at bedtime. Application full inside rectum at bedtime for 7 days. 02/05/17  Yes Willia Craze, NP  metoprolol tartrate (LOPRESSOR) 50 MG tablet Take One and a half tablet by mouth twice daily Patient taking differently: Take 75 mg by mouth 2 (two) times daily. Take One and a half tablet by mouth twice daily 10/22/16  Yes Reed, Tiffany L, DO  omeprazole (PRILOSEC) 20 MG capsule TAKE 1 CAPSULE BY MOUTH 2 TIMES DAILY BEFORE A MEAL. 12/26/16  Yes Milus Banister, MD  ondansetron (ZOFRAN ODT) 4 MG disintegrating tablet Take 1 tablet (4 mg total) by mouth every 8 (eight) hours as needed for nausea or vomiting. 10/26/16  Yes Carmon Sails J, PA-C  pravastatin (PRAVACHOL) 40 MG tablet TAKE 1 TABLET EVERY EVENING 01/03/17  Yes Reed, Tiffany L, DO  traMADol (ULTRAM) 50 MG tablet Take 1 tablet (50 mg total) by mouth daily as needed (severe headache). Patient taking differently: Take  100 mg by mouth daily as needed (severe headache).  10/08/16  Yes Reed, Tiffany L, DO  promethazine (PHENERGAN) 25 MG tablet Take 1 tablet (25 mg total) by mouth every 6 (six) hours as needed for nausea or vomiting. Patient not taking: Reported on 04/29/2017 02/19/17   Noemi Chapel, MD    Physical Exam: Vitals:   04/29/17 1153 04/29/17 1230 04/29/17 1350 04/29/17 1400  BP:  (!) 165/68 (!) 158/72   Pulse:  98 (!) 104   Resp:  (!) 26 (!) 25   Temp: (!) 102.5 F (39.2 C)   100 F (37.8 C)  TempSrc: Oral   Oral  SpO2:  92% 91%   Weight:      Height:        Constitutional: NAD, calm, comfortable Vitals:   04/29/17 1153 04/29/17 1230 04/29/17 1350 04/29/17 1400  BP:  (!) 165/68 (!) 158/72   Pulse:  98 (!) 104   Resp:  (!) 26 (!) 25   Temp: (!) 102.5 F (39.2 C)   100 F (37.8 C)  TempSrc: Oral   Oral  SpO2:  92% 91%   Weight:      Height:       Eyes: PERRL, lids and conjunctivae normal. Positive pallor.  Head normocephalic, nose and ears with no deformities ENMT: Mucous membranes are dry. Posterior pharynx clear of any exudate or lesions.Normal dentition.  Neck: normal, supple, no masses, no thyromegaly Respiratory: decreased breath sounds on auscultation bilaterally, no wheezing, no crackles, bilateral diffuse rhonchi.  Cardiovascular: Regular rate and rhythm, no murmurs / rubs / gallops. No extremity edema. 2+ pedal pulses. No carotid bruits.  Abdomen: no tenderness, no masses palpated. No hepatosplenomegaly. Bowel sounds positive. Protuberant abdomen.  Musculoskeletal: no clubbing / cyanosis. No joint deformity upper and lower extremities. Good ROM, no contractures. Normal muscle tone.  Skin: no rashes, lesions, ulcers. No induration Neurologic: CN 2-12 grossly intact. Sensation intact, DTR normal. Strength 5/5 in all 4.     Labs on Admission: I have personally reviewed following labs and imaging studies  CBC: Recent Labs  Lab 04/29/17 1134  WBC 5.5  NEUTROABS 4.5    HGB 10.3*  HCT 31.4*  MCV 97.2  PLT 86*   Basic Metabolic Panel: Recent Labs  Lab 04/29/17 1134  NA 138  K 3.1*  CL 94*  CO2 29  GLUCOSE  136*  BUN 22*  CREATININE 7.57*  CALCIUM 8.6*   GFR: Estimated Creatinine Clearance: 12.9 mL/min (A) (by C-G formula based on SCr of 7.57 mg/dL (H)). Liver Function Tests: Recent Labs  Lab 04/29/17 1134  AST 29  ALT 28  ALKPHOS 69  BILITOT 0.7  PROT 8.5*  ALBUMIN 4.1   No results for input(s): LIPASE, AMYLASE in the last 168 hours. No results for input(s): AMMONIA in the last 168 hours. Coagulation Profile: Recent Labs  Lab 04/29/17 1134  INR 0.98   Cardiac Enzymes: No results for input(s): CKTOTAL, CKMB, CKMBINDEX, TROPONINI in the last 168 hours. BNP (last 3 results) No results for input(s): PROBNP in the last 8760 hours. HbA1C: No results for input(s): HGBA1C in the last 72 hours. CBG: No results for input(s): GLUCAP in the last 168 hours. Lipid Profile: No results for input(s): CHOL, HDL, LDLCALC, TRIG, CHOLHDL, LDLDIRECT in the last 72 hours. Thyroid Function Tests: No results for input(s): TSH, T4TOTAL, FREET4, T3FREE, THYROIDAB in the last 72 hours. Anemia Panel: No results for input(s): VITAMINB12, FOLATE, FERRITIN, TIBC, IRON, RETICCTPCT in the last 72 hours. Urine analysis:    Component Value Date/Time   COLORURINE YELLOW 02/19/2017 1520   APPEARANCEUR HAZY (A) 02/19/2017 1520   LABSPEC 1.012 02/19/2017 1520   PHURINE 8.0 02/19/2017 1520   GLUCOSEU 50 (A) 02/19/2017 1520   HGBUR MODERATE (A) 02/19/2017 1520   BILIRUBINUR NEGATIVE 02/19/2017 1520   KETONESUR NEGATIVE 02/19/2017 1520   PROTEINUR >=300 (A) 02/19/2017 1520   UROBILINOGEN 0.2 04/06/2014 1030   NITRITE NEGATIVE 02/19/2017 1520   LEUKOCYTESUR MODERATE (A) 02/19/2017 1520    Radiological Exams on Admission: Dg Chest 2 View  Result Date: 04/29/2017 CLINICAL DATA:  Shortness of breath and wheezing. Productive cough and fever. EXAM: CHEST - 2  VIEW COMPARISON:  One-view chest x-ray 02/19/2017 FINDINGS: The heart size is normal. Ill-defined right upper lobe airspace disease is present. Lung volumes are low. The visualized soft tissues and bony thorax are unremarkable. The cervical spine is within normal limits. IMPRESSION: 1. Ill-defined right upper lobe airspace disease consistent with pneumonia. 2. Low lung volumes without other airspace disease. Electronically Signed   By: San Morelle M.D.   On: 04/29/2017 11:50    EKG: Independently reviewed. NA  Assessment/Plan Active Problems:   Fever  This is a 57 year old male with end-stage renal disease on hemodialysis who presents with 48 hours of worsening malaise, associated with productive cough, headache, nausea and poor appetite.  On his initial physical examination his temperature is 102.11F, heart rate 98, blood pressure 165/68, respiratory rate 26, oxygen saturation 91-92% on room air.  Dry mucous membranes, mild pallor, lungs with bilateral rhonchi, heart S1-S2 present rhythmic, abdomen soft nontender, no lower extremity edema.  Sodium 138, potassium 3.1, chloride 94, bicarb 29, glucose 136, BUN 22, creatinine 7.57, white count 5.5, hemoglobin 10.3, hematocrit 31.4, platelets 86.  Chest x-ray with a small patchy infiltrate at the right upper lobe.   Patient will be admitted to the hospital with a working diagnosis of right upper lobe pneumonia, complicated by sepsis and hypokalemia.   1.  Sepsis due to community acquired, right upper lobe pneumonia.  Patient will be admitted to the medical ward, he will be placed on a remote telemetry monitor, antibiotic therapy with ceftriaxone and azithromycin.  Will follow-up on cultures, cell count and temperature curve.  Patient with end-stage renal disease will hold on IV fluids.  Bronchodilator therapy as needed.  Check sputum  culture.  2.  End-stage renal disease on hemodialysis with hypokalemia.  Hold on intravenous fluids to prevent  volume overload, dialysis schedule Monday, Wednesdays and Fridays.  He does have a fistula as hemodialysis access, clinically with no signs of local infection.  Continue calcitriol, calcium acetate and Sensipar.  Hold on potassium supplementation for now.  3.  Hypertension.  Will continue metoprolol and hydralazine, for blood pressure control.  4.  Asthma/COPD.  Continue budesonide/formoterol, continue bronchodilator therapy, oximetry monitoring and submental oxygen per nasal cannula to target O2 saturation greater than 92%.   5.  Dyslipidemia.  Continue pravastatin.   DVT prophylaxis:  Heparin  Code Status:  full  Family Communication: I spoke with patient's family at the bedside and all questions were addressed.   Disposition Plan: home when clinically improves  Consults called: no   Admission status: inpatient.     Maple Odaniel Gerome Apley MD [facility] Pager [phone]  If 7PM-7AM, please contact night-coverage [email or web address] Password [email or web address]  04/29/2017, 2:17 PM

## 2017-04-30 LAB — COMPREHENSIVE METABOLIC PANEL
ALK PHOS: 54 U/L (ref 38–126)
ALT: 21 U/L (ref 17–63)
AST: 28 U/L (ref 15–41)
Albumin: 3.4 g/dL — ABNORMAL LOW (ref 3.5–5.0)
Anion gap: 18 — ABNORMAL HIGH (ref 5–15)
BUN: 38 mg/dL — ABNORMAL HIGH (ref 6–20)
CALCIUM: 8.5 mg/dL — AB (ref 8.9–10.3)
CO2: 22 mmol/L (ref 22–32)
CREATININE: 11.33 mg/dL — AB (ref 0.61–1.24)
Chloride: 98 mmol/L — ABNORMAL LOW (ref 101–111)
GFR, EST AFRICAN AMERICAN: 5 mL/min — AB (ref >60)
GFR, EST NON AFRICAN AMERICAN: 4 mL/min — AB (ref >60)
Glucose, Bld: 118 mg/dL — ABNORMAL HIGH (ref 65–99)
Potassium: 4.5 mmol/L (ref 3.5–5.1)
Sodium: 138 mmol/L (ref 135–145)
Total Bilirubin: 0.9 mg/dL (ref 0.3–1.2)
Total Protein: 7 g/dL (ref 6.5–8.1)

## 2017-04-30 LAB — CBC
HCT: 28.7 % — ABNORMAL LOW (ref 39.0–52.0)
Hemoglobin: 9.2 g/dL — ABNORMAL LOW (ref 13.0–17.0)
MCH: 31.9 pg (ref 26.0–34.0)
MCHC: 32.1 g/dL (ref 30.0–36.0)
MCV: 99.7 fL (ref 78.0–100.0)
PLATELETS: 73 10*3/uL — AB (ref 150–400)
RBC: 2.88 MIL/uL — AB (ref 4.22–5.81)
RDW: 13.7 % (ref 11.5–15.5)
WBC: 3.3 10*3/uL — AB (ref 4.0–10.5)

## 2017-04-30 LAB — MRSA PCR SCREENING: MRSA BY PCR: NEGATIVE

## 2017-04-30 MED ORDER — EPOETIN ALFA 3000 UNIT/ML IJ SOLN
3000.0000 [IU] | INTRAMUSCULAR | Status: DC
  Administered 2017-05-01: 3000 [IU] via SUBCUTANEOUS
  Filled 2017-04-30 (×2): qty 1

## 2017-04-30 MED ORDER — HYDRALAZINE HCL 20 MG/ML IJ SOLN
10.0000 mg | INTRAMUSCULAR | Status: DC | PRN
  Administered 2017-04-30: 10 mg via INTRAVENOUS
  Filled 2017-04-30: qty 1

## 2017-04-30 NOTE — Plan of Care (Signed)
Pt had unremarkable night, temperature trending down. Will continue to monitor.

## 2017-04-30 NOTE — Progress Notes (Signed)
PROGRESS NOTE    Kirk [name]  SKA:[id number] DOB: 05-Feb-1960 DOA: 04/29/2017 PCP: Gayland Curry, DO    Brief Narrative:  This is a 57 year old male with end-stage renal disease on hemodialysis who presents with 48 hours of worsening malaise, associated with productive cough, headache, nausea and poor appetite.  On his initial physical examination his temperature is 102.99F, heart rate 98, blood pressure 165/68, respiratory rate 26, oxygen saturation 91-92% on room air.  Dry mucous membranes, mild pallor, lungs with bilateral rhonchi, heart S1-S2 present rhythmic, abdomen soft nontender, no lower extremity edema.  Sodium 138, potassium 3.1, chloride 94, bicarb 29, glucose 136, BUN 22, creatinine 7.57, white count 5.5, hemoglobin 10.3, hematocrit 31.4, platelets 86.  Chest x-ray with a small patchy infiltrate at the right upper lobe.   Patient was admitted to the hospital with a working diagnosis of right upper lobe pneumonia, complicated by sepsis and hypokalemia.    Assessment & Plan:   Active Problems:   Fever   1.  Sepsis due to community acquired, right upper lobe pneumonia.  Clinically improving, dyspnea improved, will continue antibiotic therapy with ceftriaxone and azithromycim. Follow up on cultures. Patient has been afebrile, wbc down to 3.3.   2.  End-stage renal disease on hemodialysis with hypokalemia. Case discussed with nephrology, Dr Lowanda Foster, patient will have HD in am per his usual regimen.  Continue calcitriol, calcium acetate and Sensipar.    3.  Hypertension.  On metoprolol and hydralazine. Systolic blood pressure 203 mmHg.   4.  Asthma/COPD.  No signs of acute exacerbation, will continue with  Budesonide/formoterol. Oximetry monitoring and submental oxygen per nasal cannula. Continue duoneb,   5.  Dyslipidemia.  On pravastatin.   6. Anxiety. Continue clonazepam as needed. No agitation or confusion.    DVT prophylaxis: heparin   Code Status:  full Family  Communication: no family at the bedside  Disposition Plan: home in am after HD if continue to improve.    Consultants:   Nephrology   Procedures:     Antimicrobials:   Ceftriaxone   Azithromycin     Subjective: Patient's dyspnea and cough have improved, but not back to baseline, no chest pain. Positive nausea but not vomiting.   Objective: Vitals:   04/30/17 0501 04/30/17 0503 04/30/17 0724 04/30/17 0729  BP: (!) 139/56     Pulse: 71     Resp: 17     Temp:  98.4 F (36.9 C)    TempSrc:  Oral    SpO2: 100%  96% 96%  Weight: 98.7 kg (217 lb 8 oz)     Height:        Intake/Output Summary (Last 24 hours) at 04/30/2017 0908 Last data filed at 04/29/2017 1930 Gross per 24 hour  Intake 740 ml  Output -  Net 740 ml   Filed Weights   04/29/17 1111 04/29/17 1604 04/30/17 0501  Weight: 107 kg (236 lb) 97.6 kg (215 lb 2.7 oz) 98.7 kg (217 lb 8 oz)    Examination:   General: Not in pain or dyspnea, deconditioned Neurology: Awake and alert, non focal  E ENT: mild pallor, no icterus, oral mucosa moist Cardiovascular: No JVD. S1-S2 present, rhythmic, no gallops, rubs, or murmurs. No lower extremity edema. Pulmonary: decreased breath sounds bilaterally, adequate air movement, positive expiratory wheezing bilaterally, with no rhonchi or rales. Gastrointestinal. Abdomen protuberant, no organomegaly, non tender, no rebound or guarding Skin. No rashes Musculoskeletal: no joint deformities     Data Reviewed:  I have personally reviewed following labs and imaging studies  CBC: Recent Labs  Lab 04/29/17 1134 04/30/17 0449  WBC 5.5 3.3*  NEUTROABS 4.5  --   HGB 10.3* 9.2*  HCT 31.4* 28.7*  MCV 97.2 99.7  PLT 86* 73*   Basic Metabolic Panel: Recent Labs  Lab 04/29/17 1134 04/30/17 0449  NA 138 138  K 3.1* 4.5  CL 94* 98*  CO2 29 22  GLUCOSE 136* 118*  BUN 22* 38*  CREATININE 7.57* 11.33*  CALCIUM 8.6* 8.5*   GFR: Estimated Creatinine Clearance: 8.2 mL/min  (A) (by C-G formula based on SCr of 11.33 mg/dL (H)). Liver Function Tests: Recent Labs  Lab 04/29/17 1134 04/30/17 0449  AST 29 28  ALT 28 21  ALKPHOS 69 54  BILITOT 0.7 0.9  PROT 8.5* 7.0  ALBUMIN 4.1 3.4*   No results for input(s): LIPASE, AMYLASE in the last 168 hours. No results for input(s): AMMONIA in the last 168 hours. Coagulation Profile: Recent Labs  Lab 04/29/17 1134  INR 0.98   Cardiac Enzymes: No results for input(s): CKTOTAL, CKMB, CKMBINDEX, TROPONINI in the last 168 hours. BNP (last 3 results) No results for input(s): PROBNP in the last 8760 hours. HbA1C: No results for input(s): HGBA1C in the last 72 hours. CBG: No results for input(s): GLUCAP in the last 168 hours. Lipid Profile: No results for input(s): CHOL, HDL, LDLCALC, TRIG, CHOLHDL, LDLDIRECT in the last 72 hours. Thyroid Function Tests: No results for input(s): TSH, T4TOTAL, FREET4, T3FREE, THYROIDAB in the last 72 hours. Anemia Panel: No results for input(s): VITAMINB12, FOLATE, FERRITIN, TIBC, IRON, RETICCTPCT in the last 72 hours.    Radiology Studies: I have reviewed all of the imaging during this hospital visit personally     Scheduled Meds: . aspirin EC  81 mg Oral Daily  . azithromycin  500 mg Oral q1800  . [START ON 05/01/2017] calcitRIOL  0.25 mcg Oral Q M,W,F-HD  . calcium acetate  667 mg Oral TID WC  . cholecalciferol  5,000 Units Oral Daily  . cinacalcet  30 mg Oral Q supper  . [START ON 05/01/2017] epoetin (EPOGEN/PROCRIT) injection  3,000 Units Subcutaneous Q M,W,F-HD  . heparin  5,000 Units Subcutaneous Q8H  . hydrocortisone  1 application Rectal QHS  . ipratropium-albuterol  3 mL Nebulization Q6H  . metoprolol tartrate  75 mg Oral BID  . mometasone-formoterol  2 puff Inhalation BID  . pantoprazole  40 mg Oral Daily  . pravastatin  40 mg Oral QPM   Continuous Infusions: . cefTRIAXone (ROCEPHIN)  IV Stopped (04/29/17 1930)     LOS: 1 day        Gustavia Carie  Gerome Apley, MD [facility] Pager [phone]

## 2017-04-30 NOTE — Evaluation (Signed)
Physical Therapy Evaluation Patient Details Name: Kirk [name] MRN: [id number] DOB: 1960-12-04 Today's Date: 04/30/2017   History of Present Illness  Kirk [name] is a 57 y.o. male with medical history significant of end stage renal disease on hemodialysis, who presents with generalized malaise for the last 48 hours.  The malaise is moderate to severe in intensity, no improving or worsening factors, it has been associated with productive cough, purulent phlegm, headache and poor appetite.  His temperature at home has been 99.  After hemodialysis today he was noted to be worse by his family members, and he was brought to the hospital for further evaluation.     Clinical Impression  Patient functioning at baseline for functional mobility and gait, demonstrates good return for ambulating in hallways and on steps in stairwell without loss of balance, c/o mild fatigue after going up/down stairs and states he usually avoids stairs at home and stays on 1st floor.  Patient instructed in strengthening/ROM for BLE with good return demonstrated and understanding acknowledged.  Plan:  Patient discharged from physical therapy to care of nursing for ambulation ad lib for length of stay.     Follow Up Recommendations No PT follow up    Equipment Recommendations  None recommended by PT    Recommendations for Other Services       Precautions / Restrictions Precautions Precautions: None Restrictions Weight Bearing Restrictions: No      Mobility  Bed Mobility Overal bed mobility: Independent                Transfers Overall transfer level: Independent                  Ambulation/Gait Ambulation/Gait assistance: Independent Ambulation Distance (Feet): 150 Feet Assistive device: None Gait Pattern/deviations: WFL(Within Functional Limits)   Gait velocity interpretation: at or above normal speed for age/gender    Stairs Stairs: Yes Stairs assistance: Modified independent  (Device/Increase time) Stair Management: One rail Right;One rail Left;Alternating pattern Number of Stairs: 9 General stair comments: Patient demonstrates good return for going up/down 9 steps using 1 siderail without loss of balance  Wheelchair Mobility    Modified Rankin (Stroke Patients Only)       Balance Overall balance assessment: No apparent balance deficits (not formally assessed)                                           Pertinent Vitals/Pain Pain Assessment: No/denies pain    Home Living Family/patient expects to be discharged to:: Private residence Living Arrangements: Spouse/significant other Available Help at Discharge: Family;Available PRN/intermittently Type of Home: House Home Access: Stairs to enter Entrance Stairs-Rails: None Entrance Stairs-Number of Steps: 3 Home Layout: Two level;Able to live on main level with bedroom/bathroom Home Equipment: Walker - 4 wheels;Cane - single point      Prior Function Level of Independence: Independent         Comments: community ambulator, drives     Journalist, newspaper        Extremity/Trunk Assessment   Upper Extremity Assessment Upper Extremity Assessment: Overall WFL for tasks assessed    Lower Extremity Assessment Lower Extremity Assessment: Overall WFL for tasks assessed    Cervical / Trunk Assessment Cervical / Trunk Assessment: Normal  Communication   Communication: No difficulties  Cognition Arousal/Alertness: Awake/alert Behavior During Therapy: WFL for tasks assessed/performed Overall Cognitive Status: Within  Functional Limits for tasks assessed                                        General Comments      Exercises General Exercises - Lower Extremity Ankle Circles/Pumps: Seated;AROM;Strengthening;Both;5 reps Long Arc Quad: Seated;AROM;Strengthening;Both;5 reps Hip Flexion/Marching: Seated;AROM;Strengthening;Both;5 reps   Assessment/Plan    PT  Assessment Patent does not need any further PT services  PT Problem List         PT Treatment Interventions      PT Goals (Current goals can be found in the Care Plan section)  Acute Rehab PT Goals Patient Stated Goal: return home PT Goal Formulation: With patient Time For Goal Achievement: May 09, 2017 Potential to Achieve Goals: Good    Frequency     Barriers to discharge        Co-evaluation               AM-PAC PT "6 Clicks" Daily Activity  Outcome Measure Difficulty turning over in bed (including adjusting bedclothes, sheets and blankets)?: None Difficulty moving from lying on back to sitting on the side of the bed? : None Difficulty sitting down on and standing up from a chair with arms (e.g., wheelchair, bedside commode, etc,.)?: None Help needed moving to and from a bed to chair (including a wheelchair)?: None Help needed walking in hospital room?: None Help needed climbing 3-5 steps with a railing? : None 6 Click Score: 24    End of Session   Activity Tolerance: Patient tolerated treatment well Patient left: in bed;with call bell/phone within reach Nurse Communication: Mobility status PT Visit Diagnosis: Unsteadiness on feet (R26.81);Other abnormalities of gait and mobility (R26.89);Muscle weakness (generalized) (M62.81)    Time: 8616-8372 PT Time Calculation (min) (ACUTE ONLY): 25 min   Charges:   PT Evaluation $PT Eval Low Complexity: 1 Low PT Treatments $Therapeutic Activity: 23-37 mins   PT G Codes:        3:05 PM, 09-May-2017 Lonell Grandchild, MPT Physical Therapist with [facility] [phone] office [phone] mobile phone

## 2017-04-30 NOTE — Consult Note (Signed)
Reason for Consult: End-stage renal disease Referring Physician: Dr.Arrien  Kirk [name] is an 57 y.o. male.  HPI: He is a patient who has history of sleep apnea, hypertension, end-stage renal disease on maintenance hemodialysis presently came with complaints of cough, yellowish sputum production and fever for the last 3 days.  According the patient he has some cough for some time but seems to be getting worse the last 3 days.  He started having some wheezing and difficulty breathing.  Patient went to hemodialysis yesterday without significant improvement.  After completing his treatment team the pain emergency room with a temperature of 103.  Presently he is feeling much better.  Patient denies any nausea or vomiting.  Past Medical History:  Diagnosis Date  . Acute edema of lung, unspecified   . Acute, but ill-defined, cerebrovascular disease   . Allergy   . Anemia   . 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)   . Anxiety   . Asthma   . Carpal tunnel syndrome   . Cellulitis and abscess of trunk   . Cholelithiasis 07/13/2014  . Chronic headaches   . Debility, unspecified   . Dermatophytosis of the body   . Dysrhythmia    history of  . Edema   . End stage renal disease on dialysis [facility])    "MWF; [facility]" (10/21/2014)  . Essential hypertension, benign   . GERD (gastroesophageal reflux disease)   . Gout, unspecified   . HTN (hypertension)   . Hypertrophy of prostate without urinary obstruction and other lower urinary tract symptoms (LUTS)   . Hypotension, unspecified   . Impotence of organic origin   . Insomnia, unspecified   . Kidney replaced by transplant   . Localization-related (focal) (partial) epilepsy and epileptic syndromes with complex partial seizures, without mention of intractable epilepsy   . Lumbago   . Memory loss   . OSA on CPAP   . Other and unspecified hyperlipidemia   . Other chronic nonalcoholic liver disease   . Other malaise and  fatigue   . Other nonspecific abnormal serum enzyme levels   . Pain in joint, lower leg   . Pain in joint, upper arm   . Pneumonia "several times"  . Renal dialysis status(V45.11) 02/05/2010   restarted 01/02/13 ofter renal trransplant failure  . Secondary hyperparathyroidism (of renal origin)   . Shortness of breath   . Sleep apnea   . Tension headache   . Unspecified constipation   . Unspecified essential hypertension   . Unspecified hereditary and idiopathic peripheral neuropathy   . Unspecified vitamin D deficiency     Past Surgical History:  Procedure Laterality Date  . AV FISTULA PLACEMENT Left ?2010   "forearm; at [facility]"  . BACK SURGERY    . CARDIAC CATHETERIZATION  03/21/2011  . CHOLECYSTECTOMY N/A 10/21/2014   Procedure: LAPAROSCOPIC CHOLECYSTECTOMY WITH INTRAOPERATIVE CHOLANGIOGRAM;  Surgeon: Autumn Messing III, MD;  Location: [place];  Service: General;  Laterality: N/A;  . COLONOSCOPY    . 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 KIDNEY TRANSPLANT  08/17/2011   [place]  10/21/2014   w/IOC  . LEFT HEART CATHETERIZATION WITH CORONARY ANGIOGRAM N/A 03/21/2011   Procedure: LEFT HEART CATHETERIZATION WITH CORONARY ANGIOGRAM;  Surgeon: [name], MD;  Location: [facility] CATH LAB;  Service: Cardiovascular;  Laterality: N/A;  . NEPHRECTOMY  08/2013   removed transplaned kidney  . POSTERIOR FUSION CERVICAL  SPINE  06/25/2012   for spinal stenosis  . VASECTOMY  2010    Family History  Adopted: Yes  Problem Relation Age of Onset  . Colon cancer Neg Hx   . Esophageal cancer Neg Hx   . Rectal cancer Neg Hx   . Stomach cancer Neg Hx     Social History:  reports that he has been smoking e-cigarettes.  He has a 16.00 pack-year smoking history. He has never used smokeless tobacco. He reports that he does not drink alcohol or use drugs.  Allergies:  Allergies  Allergen Reactions  . Codeine Nausea And Vomiting and  Nausea Only    Altered mental status    Medications: I have reviewed the patient's current medications.  Results for orders placed or performed during the hospital encounter of 04/29/17 (from the past 48 hour(s))  Comprehensive metabolic panel     Status: Abnormal   Collection Time: 04/29/17 11:34 AM  Result Value Ref Range   Sodium 138 135 - 145 mmol/L   Potassium 3.1 (L) 3.5 - 5.1 mmol/L   Chloride 94 (L) 101 - 111 mmol/L   CO2 29 22 - 32 mmol/L   Glucose, Bld 136 (H) 65 - 99 mg/dL   BUN 22 (H) 6 - 20 mg/dL   Creatinine, Ser 7.57 (H) 0.61 - 1.24 mg/dL   Calcium 8.6 (L) 8.9 - 10.3 mg/dL   Total Protein 8.5 (H) 6.5 - 8.1 g/dL   Albumin 4.1 3.5 - 5.0 g/dL   AST 29 15 - 41 U/L   ALT 28 17 - 63 U/L   Alkaline Phosphatase 69 38 - 126 U/L   Total Bilirubin 0.7 0.3 - 1.2 mg/dL   GFR calc non Af Amer 7 (L) >60 mL/min   GFR calc Af Amer 8 (L) >60 mL/min    Comment: (NOTE) The eGFR has been calculated using the CKD EPI equation. This calculation has not been validated in all clinical situations. eGFR's persistently <60 mL/min signify possible Chronic Kidney Disease.    Anion gap 15 5 - 15    Comment: Performed at [facility], [address], [place], [place] [address]  CBC with Differential     Status: Abnormal   Collection Time: 04/29/17 11:34 AM  Result Value Ref Range   WBC 5.5 4.0 - 10.5 K/uL   RBC 3.23 (L) 4.22 - 5.81 MIL/uL   Hemoglobin 10.3 (L) 13.0 - 17.0 g/dL   HCT 31.4 (L) 39.0 - 52.0 %   MCV 97.2 78.0 - 100.0 fL   MCH 31.9 26.0 - 34.0 pg   MCHC 32.8 30.0 - 36.0 g/dL   RDW 13.8 11.5 - 15.5 %   Platelets 86 (L) 150 - 400 K/uL    Comment: SPECIMEN CHECKED FOR CLOTS PLATELET COUNT CONFIRMED BY SMEAR    Neutrophils Relative % 82 %   Neutro Abs 4.5 1.7 - 7.7 K/uL   Lymphocytes Relative 7 %   Lymphs Abs 0.4 (L) 0.7 - 4.0 K/uL   Monocytes Relative 11 %   Monocytes Absolute 0.6 0.1 - 1.0 K/uL   Eosinophils Relative 0 %   Eosinophils Absolute 0.0 0.0 - 0.7 K/uL    Basophils Relative 0 %   Basophils Absolute 0.0 0.0 - 0.1 K/uL    Comment: Performed at [facility], [address] [place],  [address]  Protime-INR     Status: None   Collection Time: 04/29/17 11:34 AM  Result Value Ref Range   Prothrombin Time 12.9  11.4 - 15.2 seconds   INR 0.98     Comment: Performed at [facility], [address] [place], [place] [address]  Culture, blood (Routine x 2)     Status: None (Preliminary result)   Collection Time: 04/29/17 11:34 AM  Result Value Ref Range   Specimen Description BLOOD RIGHT ARM DRAWN BY RN    Special Requests      BOTTLES DRAWN AEROBIC AND ANAEROBIC Blood Culture adequate volume   Culture      NO GROWTH < 24 HOURS Performed at [facility], [address], [place], [place] [address]    Report Status PENDING   Culture, blood (Routine x 2)     Status: None (Preliminary result)   Collection Time: 04/29/17 11:34 AM  Result Value Ref Range   Specimen Description BLOOD RIGHT HAND DRAWN BY RN    Special Requests      BOTTLES DRAWN AEROBIC AND ANAEROBIC Blood Culture adequate volume   Culture      NO GROWTH < 24 HOURS Performed at [facility], [address], [place], [place] [address]    Report Status PENDING   I-Stat CG4 Lactic Acid, ED     Status: None   Collection Time: 04/29/17 11:38 AM  Result Value Ref Range   Lactic Acid, Venous 1.01 0.5 - 1.9 mmol/L  MRSA PCR Screening     Status: None   Collection Time: 04/29/17  8:30 PM  Result Value Ref Range   MRSA by PCR NEGATIVE NEGATIVE    Comment:        The GeneXpert MRSA Assay (FDA approved for NASAL specimens only), is one component of a comprehensive MRSA colonization surveillance program. It is not intended to diagnose MRSA infection nor to guide or monitor treatment for MRSA infections. Performed at [facility], [address] [place], [place] [address]   Comprehensive metabolic panel     Status: Abnormal   Collection Time: 04/30/17  4:49 AM  Result Value Ref  Range   Sodium 138 135 - 145 mmol/L   Potassium 4.5 3.5 - 5.1 mmol/L    Comment: DELTA CHECK NOTED   Chloride 98 (L) 101 - 111 mmol/L   CO2 22 22 - 32 mmol/L   Glucose, Bld 118 (H) 65 - 99 mg/dL   BUN 38 (H) 6 - 20 mg/dL   Creatinine, Ser 11.33 (H) 0.61 - 1.24 mg/dL   Calcium 8.5 (L) 8.9 - 10.3 mg/dL   Total Protein 7.0 6.5 - 8.1 g/dL   Albumin 3.4 (L) 3.5 - 5.0 g/dL   AST 28 15 - 41 U/L   ALT 21 17 - 63 U/L   Alkaline Phosphatase 54 38 - 126 U/L   Total Bilirubin 0.9 0.3 - 1.2 mg/dL   GFR calc non Af Amer 4 (L) >60 mL/min   GFR calc Af Amer 5 (L) >60 mL/min    Comment: (NOTE) The eGFR has been calculated using the CKD EPI equation. This calculation has not been validated in all clinical situations. eGFR's persistently <60 mL/min signify possible Chronic Kidney Disease.    Anion gap 18 (H) 5 - 15    Comment: Performed at [facility], [address], [place], [place] [address]  CBC     Status: Abnormal   Collection Time: 04/30/17  4:49 AM  Result Value Ref Range   WBC 3.3 (L) 4.0 - 10.5 K/uL   RBC 2.88 (L) 4.22 - 5.81 MIL/uL   Hemoglobin 9.2 (L) 13.0 -  17.0 g/dL   HCT 28.7 (L) 39.0 - 52.0 %   MCV 99.7 78.0 - 100.0 fL   MCH 31.9 26.0 - 34.0 pg   MCHC 32.1 30.0 - 36.0 g/dL   RDW 13.7 11.5 - 15.5 %   Platelets 73 (L) 150 - 400 K/uL    Comment: SPECIMEN CHECKED FOR CLOTS CONSISTENT WITH PREVIOUS RESULT Performed at [facility], [address], [place], [place] [address]     Dg Chest 2 View  Result Date: 04/29/2017 CLINICAL DATA:  Shortness of breath and wheezing. Productive cough and fever. EXAM: CHEST - 2 VIEW COMPARISON:  One-view chest x-ray 02/19/2017 FINDINGS: The heart size is normal. Ill-defined right upper lobe airspace disease is present. Lung volumes are low. The visualized soft tissues and bony thorax are unremarkable. The cervical spine is within normal limits. IMPRESSION: 1. Ill-defined right upper lobe airspace disease consistent with pneumonia. 2. Low lung  volumes without other airspace disease. Electronically Signed   By: San Morelle M.D.   On: 04/29/2017 11:50    Review of Systems  Constitutional: Positive for chills, fever and malaise/fatigue.  HENT: Positive for congestion.   Respiratory: Positive for cough, sputum production, shortness of breath and wheezing.   Cardiovascular: Negative for chest pain.  Gastrointestinal: Negative for nausea and vomiting.   Blood pressure (!) 139/56, pulse 71, temperature 98.4 F (36.9 C), temperature source Oral, resp. rate 17, height '5\' 7"'$  (1.702 m), weight 98.7 kg (217 lb 8 oz), SpO2 96 %. Physical Exam  Constitutional: He is oriented to person, place, and time. No distress.  HENT:  Mouth/Throat: No oropharyngeal exudate.  Neck: No JVD present.  Cardiovascular: Normal rate and regular rhythm.  Respiratory: No respiratory distress. He has wheezes.  GI: He exhibits no distension. There is no tenderness.  Musculoskeletal: He exhibits no edema.  Neurological: He is alert and oriented to person, place, and time.    Assessment/Plan: 1] history of cough, fever, sputum production.  Chest x-ray shows presently is feeling better.  Some Right upper lobe consolidation.  Patient has a fistula and no other source of infection. 2] end-stage renal disease: Is status post hemodialysis yesterday.  Presently does not have any nausea or vomiting.  Potassium is normal 3] hypertension: His blood pressure is reasonably controlled 4] diabetes 5] anemia: His hemoglobin is below target goal. 6 bone and mineral disorder: Calcium is in range but phosphorus is not available. Plan: ]We will make arrangement for patient to get dialysis tomorrow which is his regular schedule. 2] we will dialyze him for 4 hours and remove about 3 L if systolic blood pressure remains above 90 3] will use Epogen 4000 units IV after each dialysis 4] we will check renal panel and CBC in the morning.   Aljean Horiuchi S 04/30/2017, 8:05  AM

## 2017-05-01 ENCOUNTER — Inpatient Hospital Stay (HOSPITAL_COMMUNITY): Payer: Medicare Other

## 2017-05-01 DIAGNOSIS — J441 Chronic obstructive pulmonary disease with (acute) exacerbation: Secondary | ICD-10-CM

## 2017-05-01 DIAGNOSIS — J181 Lobar pneumonia, unspecified organism: Secondary | ICD-10-CM

## 2017-05-01 DIAGNOSIS — Z992 Dependence on renal dialysis: Secondary | ICD-10-CM

## 2017-05-01 DIAGNOSIS — A419 Sepsis, unspecified organism: Secondary | ICD-10-CM

## 2017-05-01 DIAGNOSIS — J189 Pneumonia, unspecified organism: Secondary | ICD-10-CM

## 2017-05-01 DIAGNOSIS — N186 End stage renal disease: Secondary | ICD-10-CM

## 2017-05-01 LAB — CBC WITH DIFFERENTIAL/PLATELET
Basophils Absolute: 0 10*3/uL (ref 0.0–0.1)
Basophils Relative: 0 %
EOS ABS: 0.1 10*3/uL (ref 0.0–0.7)
Eosinophils Relative: 4 %
HCT: 25.9 % — ABNORMAL LOW (ref 39.0–52.0)
Hemoglobin: 8.5 g/dL — ABNORMAL LOW (ref 13.0–17.0)
LYMPHS ABS: 0.6 10*3/uL — AB (ref 0.7–4.0)
LYMPHS PCT: 18 %
MCH: 32.2 pg (ref 26.0–34.0)
MCHC: 32.8 g/dL (ref 30.0–36.0)
MCV: 98.1 fL (ref 78.0–100.0)
MONO ABS: 0.4 10*3/uL (ref 0.1–1.0)
MONOS PCT: 13 %
Neutro Abs: 2.1 10*3/uL (ref 1.7–7.7)
Neutrophils Relative %: 65 %
Platelets: 63 10*3/uL — ABNORMAL LOW (ref 150–400)
RBC: 2.64 MIL/uL — ABNORMAL LOW (ref 4.22–5.81)
RDW: 13.2 % (ref 11.5–15.5)
WBC: 3.3 10*3/uL — ABNORMAL LOW (ref 4.0–10.5)

## 2017-05-01 LAB — EXPECTORATED SPUTUM ASSESSMENT W GRAM STAIN, RFLX TO RESP C

## 2017-05-01 LAB — PROCALCITONIN: PROCALCITONIN: 1.99 ng/mL

## 2017-05-01 LAB — RENAL FUNCTION PANEL
Albumin: 3.2 g/dL — ABNORMAL LOW (ref 3.5–5.0)
Anion gap: 19 — ABNORMAL HIGH (ref 5–15)
BUN: 56 mg/dL — AB (ref 6–20)
CHLORIDE: 98 mmol/L — AB (ref 101–111)
CO2: 23 mmol/L (ref 22–32)
Calcium: 8.8 mg/dL — ABNORMAL LOW (ref 8.9–10.3)
Creatinine, Ser: 13.83 mg/dL — ABNORMAL HIGH (ref 0.61–1.24)
GFR calc Af Amer: 4 mL/min — ABNORMAL LOW (ref >60)
GFR, EST NON AFRICAN AMERICAN: 3 mL/min — AB (ref >60)
GLUCOSE: 126 mg/dL — AB (ref 65–99)
POTASSIUM: 3.4 mmol/L — AB (ref 3.5–5.1)
Phosphorus: 9 mg/dL — ABNORMAL HIGH (ref 2.5–4.6)
Sodium: 140 mmol/L (ref 135–145)

## 2017-05-01 LAB — INFLUENZA PANEL BY PCR (TYPE A & B)
Influenza A By PCR: NEGATIVE
Influenza B By PCR: NEGATIVE

## 2017-05-01 LAB — EXPECTORATED SPUTUM ASSESSMENT W REFEX TO RESP CULTURE

## 2017-05-01 MED ORDER — DIPHENHYDRAMINE HCL 50 MG/ML IJ SOLN
12.5000 mg | Freq: Once | INTRAMUSCULAR | Status: AC
  Administered 2017-05-01: 12.5 mg via INTRAVENOUS
  Filled 2017-05-01: qty 1

## 2017-05-01 MED ORDER — METHYLPREDNISOLONE SODIUM SUCC 40 MG IJ SOLR
40.0000 mg | Freq: Two times a day (BID) | INTRAMUSCULAR | Status: DC
  Administered 2017-05-01 – 2017-05-02 (×3): 40 mg via INTRAVENOUS
  Filled 2017-05-01 (×3): qty 1

## 2017-05-01 MED ORDER — PENTAFLUOROPROP-TETRAFLUOROETH EX AERO: 1.0000 "application " | INHALATION_SPRAY | CUTANEOUS | Status: DC | PRN

## 2017-05-01 MED ORDER — PROCHLORPERAZINE EDISYLATE 5 MG/ML IJ SOLN
5.0000 mg | Freq: Once | INTRAMUSCULAR | Status: AC
  Administered 2017-05-01: 5 mg via INTRAVENOUS
  Filled 2017-05-01: qty 2

## 2017-05-01 MED ORDER — SODIUM CHLORIDE 0.9 % IV SOLN: 100.0000 mL | INTRAVENOUS | Status: DC | PRN

## 2017-05-01 MED ORDER — LIDOCAINE HCL (PF) 1 % IJ SOLN: 5.0000 mL | INTRAMUSCULAR | Status: DC | PRN

## 2017-05-01 MED ORDER — LIDOCAINE-PRILOCAINE 2.5-2.5 % EX CREA: 1.0000 "application " | TOPICAL_CREAM | CUTANEOUS | Status: DC | PRN

## 2017-05-01 MED ORDER — BUDESONIDE 0.5 MG/2ML IN SUSP
0.5000 mg | Freq: Two times a day (BID) | RESPIRATORY_TRACT | Status: DC
  Administered 2017-05-01 – 2017-05-02 (×2): 0.5 mg via RESPIRATORY_TRACT
  Filled 2017-05-01 (×2): qty 2

## 2017-05-01 NOTE — Progress Notes (Signed)
Patient placed on Droplet precaution per Dr Doristine Devoid orders,patient and family educated,and provided a carenote both verbalized understanding.Will continue to monitor patient.

## 2017-05-01 NOTE — Procedures (Signed)
     HEMODIALYSIS TREATMENT NOTE:   4 hour heparin-free dialysis completed via left forearm AVF (15g/antegrade). Goal met: 2 liters removed without interruption in ultrafiltration.  All blood was returned and hemostasis was achieved in 14 minutes.    Rockwell Alexandria, RN, CDN

## 2017-05-01 NOTE — Progress Notes (Addendum)
PROGRESS NOTE  Kirk [name] BUL:[id number] DOB: 07-10-1960 DOA: 04/29/2017 PCP: Gayland Curry, DO  Brief History:  57 year old male with end-stage renal disease on hemodialysis who presents with 48 hours of worsening malaise, associated with productive cough, headache, nausea and poor appetite. On his initial physical examination his temperature is 102.44F, heart rate 98, blood pressure 165/68, respiratory rate 26, oxygen saturation 91-92% on room air. Dry mucous membranes,mild pallor, lungs with bilateral rhonchi, heart S1-S2 present rhythmic, abdomen soft nontender, no lower extremity edema. Sodium 138, potassium 3.1, chloride 94, bicarb 29, glucose 136, BUN 22, creatinine 7.57, white count 5.5,hemoglobin 10.3, hematocrit 31.4, platelets 86.Chest x-ray with a small patchy infiltrate at the right upper lobe.  Patient was admitted to the hospital with a working diagnosis of right upper lobe pneumonia,complicated by sepsis and hypokalemia.   The patient was started on intravenous antibiotics.  Nephrology was consulted to assist with dialysis.    Assessment/Plan: Sepsis -due to pneumonia -continue ceftriaxone and azithromycin -pt has defervesced--no fever in the last 24 hours -Initially presented with fever of 102.5 F -Blood cultures remain negative -Personally reviewed chest x-ray--mild right upper lobe opacity -most recent temp 98.44F at 1240 on 05/01/17  Healthcare associated pneumonia -Defervesced on ceftriaxone and azithromycin -Check procalcitonin -Influenza PCR -Viral respiratory panel  Intractable headache -Likely toxic headache from the patient's sepsis -CT brain -Continue tramadol as needed severe headache -benadryl and compazine IV x 1  ESRD -Last received dialysis for 05/01/2017 -Nephrology following -Metabolic bone disease per nephrology  Essential hypertension -Continue metoprolol tartrate and hydralazine -remains high  COPD  Exacerbation -mild wheezing on exam -start IV solumedrol -continue bronchodilators -start pulmicort    Disposition Plan:   Home 4/11 if stable Family Communication:   Spouse updated at bedside 4/10--Total time spent 35 minutes.  Greater than 50% spent face to face counseling and coordinating care.   Consultants:  renal  Code Status:  FULL  DVT Prophylaxis:  [place] Heparin    Procedures: As Listed in Progress Note Above  Antibiotics: None    Subjective: Patient complained of headache after dialysis today.  He states overall his breathing is better but he still has malaise.  He denies any chest pain, shortness breath, vomiting, diarrhea.  He still has a nonproductive cough.  He denies any abdominal pain, hematochezia, melena.  Objective: Vitals:   05/01/17 0845 05/01/17 0915 05/01/17 0945 05/01/17 1000  BP: (!) 162/74 (!) 167/72 (!) 165/70 (!) 162/80  Pulse: 73 81 81 86  Resp:    16  Temp:    98.1 F (36.7 C)  TempSrc:    Oral  SpO2:    97%  Weight:    98.9 kg (218 lb 0.6 oz)  Height:        Intake/Output Summary (Last 24 hours) at 05/01/2017 1250 Last data filed at 05/01/2017 0945 Gross per 24 hour  Intake 480 ml  Output 3000 ml  Net -2520 ml   Weight change: -5.049 kg (-11 lb 2.1 oz) Exam:   General:  Pt is alert, follows commands appropriately, not in acute distress  HEENT: No icterus, No thrush, No neck mass, /AT  Cardiovascular: RRR, S1/S2, no rubs, no gallops  Respiratory: Bilateral expiratory wheeze.  Good air movement.  Bibasilar rales.  Abdomen: Soft/+BS, non tender, non distended, no guarding  Extremities: No edema, No lymphangitis, No petechiae, No rashes, no synovitis   Data Reviewed: I have personally reviewed following labs  and imaging studies Basic Metabolic Panel: Recent Labs  Lab 04/29/17 1134 04/30/17 0449 05/01/17 0455  NA 138 138 140  K 3.1* 4.5 3.4*  CL 94* 98* 98*  CO2 29 22 23   GLUCOSE 136* 118* 126*  BUN 22* 38* 56*   CREATININE 7.57* 11.33* 13.83*  CALCIUM 8.6* 8.5* 8.8*  PHOS  --   --  9.0*   Liver Function Tests: Recent Labs  Lab 04/29/17 1134 04/30/17 0449 05/01/17 0455  AST 29 28  --   ALT 28 21  --   ALKPHOS 69 54  --   BILITOT 0.7 0.9  --   PROT 8.5* 7.0  --   ALBUMIN 4.1 3.4* 3.2*   No results for input(s): LIPASE, AMYLASE in the last 168 hours. No results for input(s): AMMONIA in the last 168 hours. Coagulation Profile: Recent Labs  Lab 04/29/17 1134  INR 0.98   CBC: Recent Labs  Lab 04/29/17 1134 04/30/17 0449 05/01/17 0455  WBC 5.5 3.3* 3.3*  NEUTROABS 4.5  --  2.1  HGB 10.3* 9.2* 8.5*  HCT 31.4* 28.7* 25.9*  MCV 97.2 99.7 98.1  PLT 86* 73* 63*   Cardiac Enzymes: No results for input(s): CKTOTAL, CKMB, CKMBINDEX, TROPONINI in the last 168 hours. BNP: Invalid input(s): POCBNP CBG: No results for input(s): GLUCAP in the last 168 hours. HbA1C: No results for input(s): HGBA1C in the last 72 hours. Urine analysis:    Component Value Date/Time   COLORURINE YELLOW 02/19/2017 1520   APPEARANCEUR HAZY (A) 02/19/2017 1520   LABSPEC 1.012 02/19/2017 1520   PHURINE 8.0 02/19/2017 1520   GLUCOSEU 50 (A) 02/19/2017 1520   HGBUR MODERATE (A) 02/19/2017 1520   BILIRUBINUR NEGATIVE 02/19/2017 1520   KETONESUR NEGATIVE 02/19/2017 1520   PROTEINUR >=300 (A) 02/19/2017 1520   UROBILINOGEN 0.2 04/06/2014 1030   NITRITE NEGATIVE 02/19/2017 1520   LEUKOCYTESUR MODERATE (A) 02/19/2017 1520   Sepsis Labs: @LABRCNTIP (procalcitonin:4,lacticidven:4) ) Recent Results (from the past 240 hour(s))  Culture, blood (Routine x 2)     Status: None (Preliminary result)   Collection Time: 04/29/17 11:34 AM  Result Value Ref Range Status   Specimen Description BLOOD RIGHT ARM DRAWN BY RN  Final   Special Requests   Final    BOTTLES DRAWN AEROBIC AND ANAEROBIC Blood Culture adequate volume   Culture   Final    NO GROWTH 2 DAYS Performed at [facility], [address]  [place], [place] [address]    Report Status PENDING  Incomplete  Culture, blood (Routine x 2)     Status: None (Preliminary result)   Collection Time: 04/29/17 11:34 AM  Result Value Ref Range Status   Specimen Description BLOOD RIGHT HAND DRAWN BY RN  Final   Special Requests   Final    BOTTLES DRAWN AEROBIC AND ANAEROBIC Blood Culture adequate volume   Culture   Final    NO GROWTH 2 DAYS Performed at [facility], [address], [place], [place] [address]    Report Status PENDING  Incomplete  MRSA PCR Screening     Status: None   Collection Time: 04/29/17  8:30 PM  Result Value Ref Range Status   MRSA by PCR NEGATIVE NEGATIVE Final    Comment:        The GeneXpert MRSA Assay (FDA approved for NASAL specimens only), is one component of a comprehensive MRSA colonization surveillance program. It is not intended to diagnose MRSA infection nor to guide or monitor treatment for  MRSA infections. Performed at [facility], [address] [place], [place] [address]      Scheduled Meds: . aspirin EC  81 mg Oral Daily  . azithromycin  500 mg Oral q1800  . calcitRIOL  0.25 mcg Oral Q M,W,F-HD  . calcium acetate  667 mg Oral TID WC  . cholecalciferol  5,000 Units Oral Daily  . cinacalcet  30 mg Oral Q supper  . diphenhydrAMINE  12.5 mg Intravenous Once  . epoetin (EPOGEN/PROCRIT) injection  3,000 Units Subcutaneous Q M,W,F-HD  . heparin  5,000 Units Subcutaneous Q8H  . ipratropium-albuterol  3 mL Nebulization Q6H  . methylPREDNISolone (SOLU-MEDROL) injection  40 mg Intravenous Q12H  . metoprolol tartrate  75 mg Oral BID  . mometasone-formoterol  2 puff Inhalation BID  . pantoprazole  40 mg Oral Daily  . pravastatin  40 mg Oral QPM  . prochlorperazine  5 mg Intravenous Once   Continuous Infusions: . sodium chloride    . sodium chloride    . cefTRIAXone (ROCEPHIN)  IV Stopped (04/30/17 1933)    Procedures/Studies: Dg Chest 2 View  Result Date: 04/29/2017 CLINICAL DATA:   Shortness of breath and wheezing. Productive cough and fever. EXAM: CHEST - 2 VIEW COMPARISON:  One-view chest x-ray 02/19/2017 FINDINGS: The heart size is normal. Ill-defined right upper lobe airspace disease is present. Lung volumes are low. The visualized soft tissues and bony thorax are unremarkable. The cervical spine is within normal limits. IMPRESSION: 1. Ill-defined right upper lobe airspace disease consistent with pneumonia. 2. Low lung volumes without other airspace disease. Electronically Signed   By: San Morelle M.D.   On: 04/29/2017 11:50    Orson Eva, DO  [facility] Pager [phone]  If 7PM-7AM, please contact night-coverage [email or web address] Password [email or web address] 05/01/2017, 12:50 PM   LOS: 2 days

## 2017-05-01 NOTE — Progress Notes (Signed)
Kirk Kirk [name]  MRN: [id number]  DOB/AGE: 55-Feb-1962 55 y.o.  Primary Care Physician:Reed, Tiffany L, DO  Admit date: 04/29/2017  Chief Complaint:  Chief Complaint  Patient presents with  . Shortness of Breath    Kirk [name]-Pt presented on  04/29/2017 with  Chief Complaint  Patient presents with  . Shortness of Breath  . Pt offers no new complaints.    meds . aspirin EC  81 mg Oral Daily  . azithromycin  500 mg Oral q1800  . calcitRIOL  0.25 mcg Oral Q M,W,F-HD  . calcium acetate  667 mg Oral TID WC  . cholecalciferol  5,000 Units Oral Daily  . cinacalcet  30 mg Oral Q supper  . epoetin (EPOGEN/PROCRIT) injection  3,000 Units Subcutaneous Q M,W,F-HD  . heparin  5,000 Units Subcutaneous Q8H  . ipratropium-albuterol  3 mL Nebulization Q6H  . metoprolol tartrate  75 mg Oral BID  . mometasone-formoterol  2 puff Inhalation BID  . pantoprazole  40 mg Oral Daily  . pravastatin  40 mg Oral QPM       Physical Exam: Vital signs in last 24 hours: Temp:  [97.4 F (36.3 C)-98.5 F (36.9 C)] 98.5 F (36.9 C) (04/09 2049) Pulse Rate:  [64-83] 83 (04/09 2049) Resp:  [18-20] 18 (04/09 2049) BP: (157-196)/(62-90) 196/90 (04/09 2049) SpO2:  [96 %-100 %] 98 % (04/10 0158) Weight change:  Last BM Date: 04/29/17  Intake/Output from previous day: 04/09 0701 - 04/10 0700 In: 820 [P.O.:820] Out: -  No intake/output data recorded.   Physical Exam: General- pt is awake,alert, oriented to time place and person Resp- No acute REsp distress,  Minimal ronchi  present CVS- S1S2 regular in rate and rhythm GIT- BS+, soft, NT, ND EXT- NO LE Edema, Cyanosis Access- left AVF  Lab Results: CBC Recent Labs    04/29/17 1134 04/30/17 0449  WBC 5.5 3.3*  HGB 10.3* 9.2*  HCT 31.4* 28.7*  PLT 86* 73*    BMET Recent Labs    04/29/17 1134 04/30/17 0449  NA 138 138  K 3.1* 4.5  CL 94* 98*  CO2 29 22  GLUCOSE 136* 118*  BUN 22* 38*  CREATININE 7.57* 11.33*  CALCIUM 8.6* 8.5*     MICRO Recent Results (from the past 240 hour(Kirk [name]))  Culture, blood (Routine x 2)     Status: None (Preliminary result)   Collection Time: 04/29/17 11:34 AM  Result Value Ref Range Status   Specimen Description BLOOD RIGHT ARM DRAWN BY RN  Final   Special Requests   Final    BOTTLES DRAWN AEROBIC AND ANAEROBIC Blood Culture adequate volume   Culture   Final    NO GROWTH < 24 HOURS Performed at [facility], [address], [place], [place] [address]    Report Status PENDING  Incomplete  Culture, blood (Routine x 2)     Status: None (Preliminary result)   Collection Time: 04/29/17 11:34 AM  Result Value Ref Range Status   Specimen Description BLOOD RIGHT HAND DRAWN BY RN  Final   Special Requests   Final    BOTTLES DRAWN AEROBIC AND ANAEROBIC Blood Culture adequate volume   Culture   Final    NO GROWTH < 24 HOURS Performed at [facility], [address] [place],  [address]    Report Status PENDING  Incomplete  MRSA PCR Screening     Status: None   Collection Time: 04/29/17  8:30 PM  Result Value Ref Range Status  MRSA by PCR NEGATIVE NEGATIVE Final    Comment:        The GeneXpert MRSA Assay (FDA approved for NASAL specimens only), is one component of a comprehensive MRSA colonization surveillance program. It is not intended to diagnose MRSA infection nor to guide or monitor treatment for MRSA infections. Performed at [facility], [address], [place], [place] [address]       Lab Results  Component Value Date   PTH 429 04/30/2015   CALCIUM 8.5 ([name]) 04/30/2017   CAION 1.02 ([name]) 09/08/2013   PHOS 7.9 (H) 01/30/2017       Impression: 1)Renal  ESRD on HD               Pt is On Monday/Wednesday/Friday schedule               Pt was last  Dialyzed Monday               Hx of failed transplant               Will dialyze today  2)HTN  Medication- Onbeta Blockers   3)Anemia In ESRD the goal for hgb ia 9--11    Pt hgb is at goal  will keep  on  epo  4)CKD Mineral-Bone Disorder Secondary Hyperparathyroidism present  Phosphorus not at goal. Pt is on binders  5)Resp  admitted with Community Acquired Pneumonia On abx Primary MD following  6)Electrolytes  Normokalemic   Hypokalemic earlier  NOrmonatremic   7)Acid base Co2 at goal       Plan:  Will dialyze  today  Addendum Pt seen on HD Pt tolerating tx well    Kirk Kirk [name] 05/01/2017, 5:25 AM

## 2017-05-02 DIAGNOSIS — E876 Hypokalemia: Secondary | ICD-10-CM

## 2017-05-02 LAB — RESPIRATORY PANEL BY PCR
Adenovirus: NOT DETECTED
BORDETELLA PERTUSSIS-RVPCR: NOT DETECTED
CORONAVIRUS 229E-RVPPCR: NOT DETECTED
CORONAVIRUS OC43-RVPPCR: NOT DETECTED
Chlamydophila pneumoniae: NOT DETECTED
Coronavirus HKU1: DETECTED — AB
Coronavirus NL63: NOT DETECTED
INFLUENZA A-RVPPCR: NOT DETECTED
INFLUENZA B-RVPPCR: NOT DETECTED
METAPNEUMOVIRUS-RVPPCR: NOT DETECTED
MYCOPLASMA PNEUMONIAE-RVPPCR: NOT DETECTED
PARAINFLUENZA VIRUS 1-RVPPCR: NOT DETECTED
PARAINFLUENZA VIRUS 4-RVPPCR: NOT DETECTED
Parainfluenza Virus 2: NOT DETECTED
Parainfluenza Virus 3: NOT DETECTED
RESPIRATORY SYNCYTIAL VIRUS-RVPPCR: NOT DETECTED
Rhinovirus / Enterovirus: NOT DETECTED

## 2017-05-02 MED ORDER — CEFDINIR 300 MG PO CAPS: 300.0000 mg | ORAL_CAPSULE | Freq: Every day | ORAL | 0 refills | Status: DC

## 2017-05-02 MED ORDER — PREDNISONE 20 MG PO TABS: 50.0000 mg | ORAL_TABLET | Freq: Every day | ORAL | Status: DC

## 2017-05-02 MED ORDER — PREDNISONE 50 MG PO TABS: 50.0000 mg | ORAL_TABLET | Freq: Every day | ORAL | 0 refills | Status: DC

## 2017-05-02 MED ORDER — AZITHROMYCIN 500 MG PO TABS: 500.0000 mg | ORAL_TABLET | Freq: Every day | ORAL | 0 refills | Status: DC

## 2017-05-02 NOTE — Progress Notes (Signed)
Removed IVs-clean, dry, intact. Reviewed d/c paperwork with pt and wife. Reviewed new medications and where to pick up prescriptions. Stable pt walked out of hospital with wife.

## 2017-05-02 NOTE — Discharge Summary (Signed)
Physician Discharge Summary  Kirk [name] WJX:[id number] DOB: 02/26/1960 DOA: 04/29/2017  PCP: Gayland Curry, DO  Admit date: 04/29/2017 Discharge date: 05/02/2017  Admitted From: Home Disposition:  Home   Recommendations for Outpatient Follow-up:  1. Follow up with PCP in 1-2 weeks 2. Please obtain BMP/CBC in one week   Discharge Condition: Stable CODE STATUS:FULL Diet recommendation: Renal diet   Brief/Interim Summary: 57 year old male with end-stage renal disease on hemodialysis who presents with 48 hours of worsening malaise, associated with productive cough, headache, nausea and poor appetite. On his initial physical examination his temperature is 102.60F, heart rate 98, blood pressure 165/68, respiratory rate 26, oxygen saturation 91-92% on room air. Dry mucous membranes,mild pallor, lungs with bilateral rhonchi, heart S1-S2 present rhythmic, abdomen soft nontender, no lower extremity edema. Sodium 138, potassium 3.1, chloride 94, bicarb 29, glucose 136, BUN 22, creatinine 7.57, white count 5.5,hemoglobin 10.3, hematocrit 31.4, platelets 86.Chest x-ray with a small patchy infiltrate at the right upper lobe.  Patient wasadmitted to the hospital with a working diagnosis of right upper lobe pneumonia,complicated by sepsis and hypokalemia.  The patient was started on intravenous antibiotics.  Nephrology was consulted to assist with dialysis.    Discharge Diagnoses:  Sepsis -due to pneumonia -continue ceftriaxone and azithromycin -pt has defervesced--no fever in the last 24 hours -Initially presented with fever of 102.5 F -Blood cultures remain negative -Personally reviewed chest x-ray--mild right upper lobe opacity -most recent temp 98.66F at 1240 on 05/01/17  Healthcare associated pneumonia -Defervesced on ceftriaxone and azithromycin -Check procalcitonin--1.99 -Influenza PCR--neg -Viral respiratory panel--positive coronavirus -home with cefdinir and  azithromycin x 4 more days to finish 7 days of tx  Intractable headache -Likely toxic headache from the patient's sepsis -CT brain--neg for acute findings -Continue tramadol as needed severe headache -benadryl and compazine IV x 1 and IV solumedrol-->improved  ESRD -Last received dialysis for 05/01/2017 -Nephrology following -Metabolic bone disease per nephrology  Essential hypertension -Continue metoprolol tartrate and hydralazine -remains high  COPD Exacerbation -mild wheezing on exam-->improved with steroids -start IV solumedrol--home with prednisone x 3 more days -continue bronchodilators -started pulmicort while inpatient -stable on RA      Discharge Instructions   Allergies as of 05/02/2017      Reactions   Codeine Nausea And Vomiting, Nausea Only   Altered mental status      Medication List    STOP taking these medications   hydrocortisone 2.5 % rectal cream Commonly known as:  ANUSOL-HC   promethazine 25 MG tablet Commonly known as:  PHENERGAN     TAKE these medications   acetaminophen 500 MG tablet Commonly known as:  TYLENOL Take 1 tablet (500 mg total) by mouth every 6 (six) hours as needed for moderate pain.   aspirin EC 81 MG tablet Take 81 mg by mouth daily.   azithromycin 500 MG tablet Commonly known as:  ZITHROMAX Take 1 tablet (500 mg total) by mouth daily.   b complex-vitamin c-folic acid 0.8 MG Tabs tablet TAKE 1 TABLET BY MOUTH EVERY DAY   BENADRYL 25 MG tablet Generic drug:  diphenhydrAMINE Take 25 mg by mouth at bedtime as needed for allergies.   budesonide-formoterol 80-4.5 MCG/ACT inhaler Commonly known as:  SYMBICORT Inhale 2 puffs into the lungs 2 (two) times daily.   calcitRIOL 0.25 MCG capsule Commonly known as:  ROCALTROL Take 1 capsule (0.25 mcg total) by mouth every Monday, Wednesday, and Friday with hemodialysis.   calcium acetate 667 MG capsule Commonly known  as:  PHOSLO Take by mouth 3 (three) times  daily with meals. Take 4 capsule with snacks and 8 capsules with meals.   cefdinir 300 MG capsule Commonly known as:  OMNICEF Take 1 capsule (300 mg total) by mouth daily.   clonazePAM 0.5 MG tablet Commonly known as:  KLONOPIN Take 1 tablet (0.5 mg total) by mouth 2 (two) times daily. What changed:    when to take this  reasons to take this   guaiFENesin 600 MG 12 hr tablet Commonly known as:  MUCINEX Take 600 mg by mouth 2 (two) times daily.   guaiFENesin-dextromethorphan 100-10 MG/5ML syrup Commonly known as:  ROBITUSSIN DM Take 10 mLs by mouth every 4 (four) hours as needed for cough.   hydrALAZINE 50 MG tablet Commonly known as:  APRESOLINE Take 1 tablet (50 mg total) by mouth every 8 (eight) hours. What changed:    when to take this  reasons to take this  additional instructions   metoprolol tartrate 50 MG tablet Commonly known as:  LOPRESSOR Take One and a half tablet by mouth twice daily What changed:    how much to take  how to take this  when to take this  additional instructions   omeprazole 20 MG capsule Commonly known as:  PRILOSEC TAKE 1 CAPSULE BY MOUTH 2 TIMES DAILY BEFORE A MEAL.   ondansetron 4 MG disintegrating tablet Commonly known as:  ZOFRAN ODT Take 1 tablet (4 mg total) by mouth every 8 (eight) hours as needed for nausea or vomiting.   pravastatin 40 MG tablet Commonly known as:  PRAVACHOL TAKE 1 TABLET EVERY EVENING   predniSONE 50 MG tablet Commonly known as:  DELTASONE Take 1 tablet (50 mg total) by mouth daily with breakfast. Start taking on:  05/03/2017   SENSIPAR 30 MG tablet Generic drug:  cinacalcet Take 30 mg by mouth daily. With dinner.   traMADol 50 MG tablet Commonly known as:  ULTRAM Take 1 tablet (50 mg total) by mouth daily as needed (severe headache). What changed:  how much to take   Vitamin D3 5000 units Tabs Take 1 tablet by mouth daily.       Allergies  Allergen Reactions  . Codeine Nausea And  Vomiting and Nausea Only    Altered mental status    Consultations:  renal   Procedures/Studies: Dg Chest 2 View  Result Date: 04/29/2017 CLINICAL DATA:  Shortness of breath and wheezing. Productive cough and fever. EXAM: CHEST - 2 VIEW COMPARISON:  One-view chest x-ray 02/19/2017 FINDINGS: The heart size is normal. Ill-defined right upper lobe airspace disease is present. Lung volumes are low. The visualized soft tissues and bony thorax are unremarkable. The cervical spine is within normal limits. IMPRESSION: 1. Ill-defined right upper lobe airspace disease consistent with pneumonia. 2. Low lung volumes without other airspace disease. Electronically Signed   By: San Morelle M.D.   On: 04/29/2017 11:50   Ct Head Wo Contrast  Result Date: 05/01/2017 CLINICAL DATA:  In stage renal disease. Fever, productive cough, headache, nausea and poor appetite. LEFT-sided headaches since 9 a.m. this morning. EXAM: CT HEAD WITHOUT CONTRAST TECHNIQUE: Contiguous axial images were obtained from the base of the skull through the vertex without intravenous contrast. COMPARISON:  Head CT dated 02/19/2017. brain MRI dated 06/22/2010. FINDINGS: Brain: Ventricles are normal in size and configuration. Mild chronic small vessel ischemic changes again noted within the periventricular white matter. No mass, hemorrhage, edema or other evidence of acute parenchymal abnormality.  No extra-axial hemorrhage. Vascular: There are chronic calcified atherosclerotic changes of the large vessels at the skull base. No unexpected hyperdense vessel. Skull: Normal. Negative for fracture or focal lesion. Sinuses/Orbits: No acute finding. Other: None. IMPRESSION: 1. No acute findings.  No intracranial mass, hemorrhage or edema. 2. Mild chronic small vessel ischemic change in the white matter. Electronically Signed   By: Franki Cabot M.D.   On: 05/01/2017 14:57         Discharge Exam: Vitals:   05/02/17 0705 05/02/17 0810    BP: (!) 179/80   Pulse: 75   Resp: 16   Temp: 98.1 F (36.7 C)   SpO2: 95% 99%   Vitals:   05/02/17 0208 05/02/17 0703 05/02/17 0705 05/02/17 0810  BP:   (!) 179/80   Pulse:   75   Resp:   16   Temp:   98.1 F (36.7 C)   TempSrc:   Oral   SpO2: 94%  95% 99%  Weight:  99 kg (218 lb 4.1 oz)    Height:        General: Pt is alert, awake, not in acute distress Cardiovascular: RRR, S1/S2 +, no rubs, no gallops Respiratory: bibasilar rales, R>L, no wheeze Abdominal: Soft, NT, ND, bowel sounds + Extremities: no edema, no cyanosis   The results of significant diagnostics from this hospitalization (including imaging, microbiology, ancillary and laboratory) are listed below for reference.    Significant Diagnostic Studies: Dg Chest 2 View  Result Date: 04/29/2017 CLINICAL DATA:  Shortness of breath and wheezing. Productive cough and fever. EXAM: CHEST - 2 VIEW COMPARISON:  One-view chest x-ray 02/19/2017 FINDINGS: The heart size is normal. Ill-defined right upper lobe airspace disease is present. Lung volumes are low. The visualized soft tissues and bony thorax are unremarkable. The cervical spine is within normal limits. IMPRESSION: 1. Ill-defined right upper lobe airspace disease consistent with pneumonia. 2. Low lung volumes without other airspace disease. Electronically Signed   By: San Morelle M.D.   On: 04/29/2017 11:50   Ct Head Wo Contrast  Result Date: 05/01/2017 CLINICAL DATA:  In stage renal disease. Fever, productive cough, headache, nausea and poor appetite. LEFT-sided headaches since 9 a.m. this morning. EXAM: CT HEAD WITHOUT CONTRAST TECHNIQUE: Contiguous axial images were obtained from the base of the skull through the vertex without intravenous contrast. COMPARISON:  Head CT dated 02/19/2017. brain MRI dated 06/22/2010. FINDINGS: Brain: Ventricles are normal in size and configuration. Mild chronic small vessel ischemic changes again noted within the  periventricular white matter. No mass, hemorrhage, edema or other evidence of acute parenchymal abnormality. No extra-axial hemorrhage. Vascular: There are chronic calcified atherosclerotic changes of the large vessels at the skull base. No unexpected hyperdense vessel. Skull: Normal. Negative for fracture or focal lesion. Sinuses/Orbits: No acute finding. Other: None. IMPRESSION: 1. No acute findings.  No intracranial mass, hemorrhage or edema. 2. Mild chronic small vessel ischemic change in the white matter. Electronically Signed   By: Franki Cabot M.D.   On: 05/01/2017 14:57     Microbiology: Recent Results (from the past 240 hour(s))  Culture, blood (Routine x 2)     Status: None (Preliminary result)   Collection Time: 04/29/17 11:34 AM  Result Value Ref Range Status   Specimen Description BLOOD RIGHT ARM DRAWN BY RN  Final   Special Requests   Final    BOTTLES DRAWN AEROBIC AND ANAEROBIC Blood Culture adequate volume   Culture   Final  NO GROWTH 3 DAYS Performed at [facility], [address] [place], [place] [address]    Report Status PENDING  Incomplete  Culture, blood (Routine x 2)     Status: None (Preliminary result)   Collection Time: 04/29/17 11:34 AM  Result Value Ref Range Status   Specimen Description BLOOD RIGHT HAND DRAWN BY RN  Final   Special Requests   Final    BOTTLES DRAWN AEROBIC AND ANAEROBIC Blood Culture adequate volume   Culture   Final    NO GROWTH 3 DAYS Performed at [facility], [address] [place], [place] [address]    Report Status PENDING  Incomplete  Culture, expectorated sputum-assessment     Status: None   Collection Time: 04/29/17  4:11 PM  Result Value Ref Range Status   Specimen Description EXPECTORATED SPUTUM  Final   Special Requests NONE  Final   Sputum evaluation   Final    Sputum specimen not acceptable for testing.  Please recollect.   PERFORMED AT [facility] Performed at [facility], [address], [place], [place] [address]     Report Status 05/01/2017 FINAL  Final  MRSA PCR Screening     Status: None   Collection Time: 04/29/17  8:30 PM  Result Value Ref Range Status   MRSA by PCR NEGATIVE NEGATIVE Final    Comment:        The GeneXpert MRSA Assay (FDA approved for NASAL specimens only), is one component of a comprehensive MRSA colonization surveillance program. It is not intended to diagnose MRSA infection nor to guide or monitor treatment for MRSA infections. Performed at [facility], [address] [place], [place] [address]   Respiratory Panel by PCR     Status: Abnormal   Collection Time: 05/01/17 12:44 PM  Result Value Ref Range Status   Adenovirus NOT DETECTED NOT DETECTED Final   Coronavirus 229E NOT DETECTED NOT DETECTED Final   Coronavirus HKU1 DETECTED (A) NOT DETECTED Final   Coronavirus NL63 NOT DETECTED NOT DETECTED Final   Coronavirus OC43 NOT DETECTED NOT DETECTED Final   Metapneumovirus NOT DETECTED NOT DETECTED Final   Rhinovirus / Enterovirus NOT DETECTED NOT DETECTED Final   Influenza A NOT DETECTED NOT DETECTED Final   Influenza B NOT DETECTED NOT DETECTED Final   Parainfluenza Virus 1 NOT DETECTED NOT DETECTED Final   Parainfluenza Virus 2 NOT DETECTED NOT DETECTED Final   Parainfluenza Virus 3 NOT DETECTED NOT DETECTED Final   Parainfluenza Virus 4 NOT DETECTED NOT DETECTED Final   Respiratory Syncytial Virus NOT DETECTED NOT DETECTED Final   Bordetella pertussis NOT DETECTED NOT DETECTED Final   Chlamydophila pneumoniae NOT DETECTED NOT DETECTED Final   Mycoplasma pneumoniae NOT DETECTED NOT DETECTED Final    Comment: Performed at [facility], [place] [address] [place], [place] [address]     Labs: Basic Metabolic Panel: Recent Labs  Lab 04/29/17 1134 04/30/17 0449 05/01/17 0455  NA 138 138 140  K 3.1* 4.5 3.4*  CL 94* 98* 98*  CO2 29 22 23   GLUCOSE 136* 118* 126*  BUN 22* 38* 56*  CREATININE 7.57* 11.33* 13.83*  CALCIUM 8.6* 8.5* 8.8*  PHOS  --   --  9.0*    Liver Function Tests: Recent Labs  Lab 04/29/17 1134 04/30/17 0449 05/01/17 0455  AST 29 28  --   ALT 28 21  --   ALKPHOS 69 54  --   BILITOT 0.7 0.9  --   PROT  8.5* 7.0  --   ALBUMIN 4.1 3.4* 3.2*   No results for input(s): LIPASE, AMYLASE in the last 168 hours. No results for input(s): AMMONIA in the last 168 hours. CBC: Recent Labs  Lab 04/29/17 1134 04/30/17 0449 05/01/17 0455  WBC 5.5 3.3* 3.3*  NEUTROABS 4.5  --  2.1  HGB 10.3* 9.2* 8.5*  HCT 31.4* 28.7* 25.9*  MCV 97.2 99.7 98.1  PLT 86* 73* 63*   Cardiac Enzymes: No results for input(s): CKTOTAL, CKMB, CKMBINDEX, TROPONINI in the last 168 hours. BNP: Invalid input(s): POCBNP CBG: No results for input(s): GLUCAP in the last 168 hours.  Time coordinating discharge:  36 minutes  Signed:  Orson Eva, DO [facility] Pager: [phone] 05/02/2017, 12:27 PM

## 2017-05-02 NOTE — Care Management Important Message (Signed)
Important Message  Patient Details  Name: GERRETT LOMAN MRN: [id number] Date of Birth: 25-Feb-1960   Medicare Important Message Given:  Yes    Brynley Cuddeback, Chauncey Reading, RN 05/02/2017, 7:43 AM

## 2017-05-02 NOTE — Progress Notes (Signed)
Subjective: Interval History: has no complaint of cough or sputum production.  His breathing is much better.  Presently offers no complaints..  Objective: Vital signs in last 24 hours: Temp:  [98.1 F (36.7 C)] 98.1 F (36.7 C) (04/11 0705) Pulse Rate:  [72-86] 75 (04/11 0705) Resp:  [16] 16 (04/11 0705) BP: (148-184)/(70-88) 179/80 (04/11 0705) SpO2:  [94 %-99 %] 99 % (04/11 0810) Weight:  [98.9 kg (218 lb 0.6 oz)-99 kg (218 lb 4.1 oz)] 99 kg (218 lb 4.1 oz) (04/11 0703) Weight change: -3.1 kg (-6 lb 13.4 oz)  Intake/Output from previous day: 04/10 0701 - 04/11 0700 In: 890 [P.O.:840; I.V.:50] Out: 3000  Intake/Output this shift: No intake/output data recorded.  General appearance: alert, cooperative and no distress Resp: diminished breath sounds bilaterally Cardio: regularly irregular rhythm Extremities: No edema  Lab Results: Recent Labs    04/30/17 0449 05/01/17 0455  WBC 3.3* 3.3*  HGB 9.2* 8.5*  HCT 28.7* 25.9*  PLT 73* 63*   BMET:  Recent Labs    04/30/17 0449 05/01/17 0455  NA 138 140  K 4.5 3.4*  CL 98* 98*  CO2 22 23  GLUCOSE 118* 126*  BUN 38* 56*  CREATININE 11.33* 13.83*  CALCIUM 8.5* 8.8*   No results for input(s): PTH in the last 72 hours. Iron Studies: No results for input(s): IRON, TIBC, TRANSFERRIN, FERRITIN in the last 72 hours.  Studies/Results: Ct Head Wo Contrast  Result Date: 05/01/2017 CLINICAL DATA:  In stage renal disease. Fever, productive cough, headache, nausea and poor appetite. LEFT-sided headaches since 9 a.m. this morning. EXAM: CT HEAD WITHOUT CONTRAST TECHNIQUE: Contiguous axial images were obtained from the base of the skull through the vertex without intravenous contrast. COMPARISON:  Head CT dated 02/19/2017. brain MRI dated 06/22/2010. FINDINGS: Brain: Ventricles are normal in size and configuration. Mild chronic small vessel ischemic changes again noted within the periventricular white matter. No mass, hemorrhage, edema  or other evidence of acute parenchymal abnormality. No extra-axial hemorrhage. Vascular: There are chronic calcified atherosclerotic changes of the large vessels at the skull base. No unexpected hyperdense vessel. Skull: Normal. Negative for fracture or focal lesion. Sinuses/Orbits: No acute finding. Other: None. IMPRESSION: 1. No acute findings.  No intracranial mass, hemorrhage or edema. 2. Mild chronic small vessel ischemic change in the white matter. Electronically Signed   By: Franki Cabot M.D.   On: 05/01/2017 14:57    I have reviewed the patient's current medications.  Assessment/Plan: 1] difficulty breathing: Has improved.  Possibly a combination of pneumonia and fluid overload.  Patient was dialyzed yesterday with 3 L removal.  Presently he is a febrile. 2] anemia: His hemoglobin is below our target goal 3] bone and mineral disorder: Calcium is a range but phosphorus is high.  Patient does state that  is supposed to take binders at home but missed the last couple of days. 4] hypertension: His blood pressure is reasonably controlled  5] history of sleep apnea: On CPAP at home. 6] fluid management: No significant sign of fluid overload Plan: 1] patient does require dialysis today 2] his next dialysis would be tomorrow which is her regular schedule.  If patient is going to discharged she will go to his regular dialysis unit as an outpatient.     LOS: 3 days   Jenevieve Kirschbaum S 05/02/2017,8:38 AM

## 2017-05-02 NOTE — Plan of Care (Signed)
progressing 

## 2017-05-02 NOTE — Progress Notes (Signed)
Pharmacy Antibiotic Note  Kirk [name] is a 57 y.o. male admitted on 04/29/2017 with CAP.  Pharmacy has been consulted for ceftriaxone dosing.  Plan:  Continue Ceftriaxone 1000 mg IV q24 hours  Patient also has azithromycin 500 mg PO daily ordered Monitor labs, c/s, and patient improvement Dose stable for age, weight, renal function and indication. No pharmacokinetic monitoring needed. Sign off.   Height: 5\' 7"  (170.2 cm) Weight: 218 lb 4.1 oz (99 kg) IBW/kg (Calculated) : 66.1  Temp (24hrs), Avg:98.1 F (36.7 C), Min:98.1 F (36.7 C), Max:98.1 F (36.7 C)  Recent Labs  Lab 04/29/17 1134 04/29/17 1138 04/30/17 0449 05/01/17 0455  WBC 5.5  --  3.3* 3.3*  CREATININE 7.57*  --  11.33* 13.83*  LATICACIDVEN  --  1.01  --   --     Estimated Creatinine Clearance: 6.8 mL/min (A) (by C-G formula based on SCr of 13.83 mg/dL (H)).    Allergies  Allergen Reactions  . Codeine Nausea And Vomiting and Nausea Only    Altered mental status   Antimicrobials this admission: Azithromcyin 4/8 >>  Ceftriaxone 4/8 >>   Dose adjustments this admission: N/A  Microbiology results: 4/8 BCx: pending 4/8 UCx: pending  4/8 Sputum: (-) 4/8 MRSA PCR:(-)  Thank you for allowing pharmacy to be a part of this patient's care.  Pricilla Larsson 05/02/2017 10:45 AM

## 2017-05-03 ENCOUNTER — Telehealth: Payer: Self-pay

## 2017-05-03 DIAGNOSIS — N2581 Secondary hyperparathyroidism of renal origin: Secondary | ICD-10-CM | POA: Diagnosis not present

## 2017-05-03 DIAGNOSIS — D631 Anemia in chronic kidney disease: Secondary | ICD-10-CM | POA: Diagnosis not present

## 2017-05-03 DIAGNOSIS — N186 End stage renal disease: Secondary | ICD-10-CM | POA: Diagnosis not present

## 2017-05-03 NOTE — Telephone Encounter (Signed)
Transition Care Management Follow-Up Telephone Call   Date discharged and where: [facility] on 05/02/2017  How have you been since you were released from the hospital? Spoke with wife who stated he as been feeling okay. No issues breathing. At dialysis today.  Any patient concerns? None  Items Reviewed:   Meds:  Y  Allergies: Y  Dietary Changes Reviewed: Y  Functional Questionnaire:  Independent-I Dependent-D  ADLs:   Dressing- I    Eating- I   Maintaining continence- I   Transferring- I   Transportation- I   Meal Prep- I   Managing Meds- I w/ assist  Confirmed importance and Date/Time of follow-up visits scheduled: Yes, Dr. Mariea Clonts 05/09/2017 @ 7:30am   Confirmed with patient if condition worsens to call PCP or go to the Emergency Dept. Patient was given office number and encouraged to call back with questions or concerns: Yes

## 2017-05-04 LAB — CULTURE, BLOOD (ROUTINE X 2)
Culture: NO GROWTH
Culture: NO GROWTH
Special Requests: ADEQUATE
Special Requests: ADEQUATE

## 2017-05-06 DIAGNOSIS — N186 End stage renal disease: Secondary | ICD-10-CM | POA: Diagnosis not present

## 2017-05-06 DIAGNOSIS — N2581 Secondary hyperparathyroidism of renal origin: Secondary | ICD-10-CM | POA: Diagnosis not present

## 2017-05-06 DIAGNOSIS — D631 Anemia in chronic kidney disease: Secondary | ICD-10-CM | POA: Diagnosis not present

## 2017-05-08 DIAGNOSIS — N2581 Secondary hyperparathyroidism of renal origin: Secondary | ICD-10-CM | POA: Diagnosis not present

## 2017-05-08 DIAGNOSIS — N186 End stage renal disease: Secondary | ICD-10-CM | POA: Diagnosis not present

## 2017-05-08 DIAGNOSIS — D631 Anemia in chronic kidney disease: Secondary | ICD-10-CM | POA: Diagnosis not present

## 2017-05-09 ENCOUNTER — Encounter: Payer: Medicare Other | Admitting: Internal Medicine

## 2017-05-10 DIAGNOSIS — D631 Anemia in chronic kidney disease: Secondary | ICD-10-CM | POA: Diagnosis not present

## 2017-05-10 DIAGNOSIS — N186 End stage renal disease: Secondary | ICD-10-CM | POA: Diagnosis not present

## 2017-05-10 DIAGNOSIS — N2581 Secondary hyperparathyroidism of renal origin: Secondary | ICD-10-CM | POA: Diagnosis not present

## 2017-05-13 DIAGNOSIS — D631 Anemia in chronic kidney disease: Secondary | ICD-10-CM | POA: Diagnosis not present

## 2017-05-13 DIAGNOSIS — N186 End stage renal disease: Secondary | ICD-10-CM | POA: Diagnosis not present

## 2017-05-13 DIAGNOSIS — N2581 Secondary hyperparathyroidism of renal origin: Secondary | ICD-10-CM | POA: Diagnosis not present

## 2017-05-15 DIAGNOSIS — N2581 Secondary hyperparathyroidism of renal origin: Secondary | ICD-10-CM | POA: Diagnosis not present

## 2017-05-15 DIAGNOSIS — D631 Anemia in chronic kidney disease: Secondary | ICD-10-CM | POA: Diagnosis not present

## 2017-05-15 DIAGNOSIS — N186 End stage renal disease: Secondary | ICD-10-CM | POA: Diagnosis not present

## 2017-05-17 DIAGNOSIS — N186 End stage renal disease: Secondary | ICD-10-CM | POA: Diagnosis not present

## 2017-05-17 DIAGNOSIS — N2581 Secondary hyperparathyroidism of renal origin: Secondary | ICD-10-CM | POA: Diagnosis not present

## 2017-05-17 DIAGNOSIS — D631 Anemia in chronic kidney disease: Secondary | ICD-10-CM | POA: Diagnosis not present

## 2017-05-20 DIAGNOSIS — D631 Anemia in chronic kidney disease: Secondary | ICD-10-CM | POA: Diagnosis not present

## 2017-05-20 DIAGNOSIS — N2581 Secondary hyperparathyroidism of renal origin: Secondary | ICD-10-CM | POA: Diagnosis not present

## 2017-05-20 DIAGNOSIS — N186 End stage renal disease: Secondary | ICD-10-CM | POA: Diagnosis not present

## 2017-05-22 DIAGNOSIS — N186 End stage renal disease: Secondary | ICD-10-CM | POA: Diagnosis not present

## 2017-05-22 DIAGNOSIS — D631 Anemia in chronic kidney disease: Secondary | ICD-10-CM | POA: Diagnosis not present

## 2017-05-22 DIAGNOSIS — T8612 Kidney transplant failure: Secondary | ICD-10-CM | POA: Diagnosis not present

## 2017-05-22 DIAGNOSIS — Z992 Dependence on renal dialysis: Secondary | ICD-10-CM | POA: Diagnosis not present

## 2017-05-22 DIAGNOSIS — N2581 Secondary hyperparathyroidism of renal origin: Secondary | ICD-10-CM | POA: Diagnosis not present

## 2017-05-23 DIAGNOSIS — R300 Dysuria: Secondary | ICD-10-CM | POA: Diagnosis not present

## 2017-05-24 DIAGNOSIS — N186 End stage renal disease: Secondary | ICD-10-CM | POA: Diagnosis not present

## 2017-05-24 DIAGNOSIS — N2581 Secondary hyperparathyroidism of renal origin: Secondary | ICD-10-CM | POA: Diagnosis not present

## 2017-05-24 DIAGNOSIS — D631 Anemia in chronic kidney disease: Secondary | ICD-10-CM | POA: Diagnosis not present

## 2017-05-27 DIAGNOSIS — N2581 Secondary hyperparathyroidism of renal origin: Secondary | ICD-10-CM | POA: Diagnosis not present

## 2017-05-27 DIAGNOSIS — D631 Anemia in chronic kidney disease: Secondary | ICD-10-CM | POA: Diagnosis not present

## 2017-05-27 DIAGNOSIS — N186 End stage renal disease: Secondary | ICD-10-CM | POA: Diagnosis not present

## 2017-05-29 DIAGNOSIS — D631 Anemia in chronic kidney disease: Secondary | ICD-10-CM | POA: Diagnosis not present

## 2017-05-29 DIAGNOSIS — N2581 Secondary hyperparathyroidism of renal origin: Secondary | ICD-10-CM | POA: Diagnosis not present

## 2017-05-29 DIAGNOSIS — N186 End stage renal disease: Secondary | ICD-10-CM | POA: Diagnosis not present

## 2017-05-30 ENCOUNTER — Emergency Department (HOSPITAL_COMMUNITY): Payer: Medicare Other

## 2017-05-30 ENCOUNTER — Encounter: Payer: Self-pay | Admitting: Internal Medicine

## 2017-05-30 ENCOUNTER — Non-Acute Institutional Stay (HOSPITAL_COMMUNITY): Payer: Medicare Other

## 2017-05-30 ENCOUNTER — Encounter (HOSPITAL_COMMUNITY): Payer: Self-pay | Admitting: Emergency Medicine

## 2017-05-30 ENCOUNTER — Ambulatory Visit (INDEPENDENT_AMBULATORY_CARE_PROVIDER_SITE_OTHER): Payer: Medicare Other | Admitting: Internal Medicine

## 2017-05-30 ENCOUNTER — Inpatient Hospital Stay (HOSPITAL_COMMUNITY)
Admission: EM | Admit: 2017-05-30 | Discharge: 2017-06-05 | DRG: 193 | Disposition: A | Discharge status: Home / Self Care | Payer: Medicare Other | Attending: Family Medicine | Admitting: Family Medicine

## 2017-05-30 VITALS — BP 200/100 | HR 82 | Temp 99.5°F | Ht 67.0 in | Wt 222.0 lb

## 2017-05-30 DIAGNOSIS — J181 Lobar pneumonia, unspecified organism: Secondary | ICD-10-CM | POA: Diagnosis not present

## 2017-05-30 DIAGNOSIS — Q8781 Alport syndrome: Secondary | ICD-10-CM | POA: Diagnosis not present

## 2017-05-30 DIAGNOSIS — F419 Anxiety disorder, unspecified: Secondary | ICD-10-CM | POA: Diagnosis not present

## 2017-05-30 DIAGNOSIS — N186 End stage renal disease: Secondary | ICD-10-CM | POA: Diagnosis present

## 2017-05-30 DIAGNOSIS — F17290 Nicotine dependence, other tobacco product, uncomplicated: Secondary | ICD-10-CM | POA: Diagnosis present

## 2017-05-30 DIAGNOSIS — E785 Hyperlipidemia, unspecified: Secondary | ICD-10-CM | POA: Diagnosis present

## 2017-05-30 DIAGNOSIS — F339 Major depressive disorder, recurrent, unspecified: Secondary | ICD-10-CM | POA: Diagnosis present

## 2017-05-30 DIAGNOSIS — M898X9 Other specified disorders of bone, unspecified site: Secondary | ICD-10-CM | POA: Diagnosis present

## 2017-05-30 DIAGNOSIS — R0682 Tachypnea, not elsewhere classified: Secondary | ICD-10-CM | POA: Diagnosis not present

## 2017-05-30 DIAGNOSIS — Z9989 Dependence on other enabling machines and devices: Secondary | ICD-10-CM | POA: Diagnosis not present

## 2017-05-30 DIAGNOSIS — I1311 Hypertensive heart and chronic kidney disease without heart failure, with stage 5 chronic kidney disease, or end stage renal disease: Secondary | ICD-10-CM | POA: Diagnosis present

## 2017-05-30 DIAGNOSIS — J440 Chronic obstructive pulmonary disease with acute lower respiratory infection: Secondary | ICD-10-CM | POA: Diagnosis present

## 2017-05-30 DIAGNOSIS — J441 Chronic obstructive pulmonary disease with (acute) exacerbation: Secondary | ICD-10-CM | POA: Diagnosis present

## 2017-05-30 DIAGNOSIS — I10 Essential (primary) hypertension: Secondary | ICD-10-CM | POA: Diagnosis present

## 2017-05-30 DIAGNOSIS — J189 Pneumonia, unspecified organism: Secondary | ICD-10-CM | POA: Diagnosis present

## 2017-05-30 DIAGNOSIS — Z992 Dependence on renal dialysis: Secondary | ICD-10-CM

## 2017-05-30 DIAGNOSIS — Z7951 Long term (current) use of inhaled steroids: Secondary | ICD-10-CM | POA: Diagnosis not present

## 2017-05-30 DIAGNOSIS — Z905 Acquired absence of kidney: Secondary | ICD-10-CM

## 2017-05-30 DIAGNOSIS — J9811 Atelectasis: Secondary | ICD-10-CM | POA: Diagnosis present

## 2017-05-30 DIAGNOSIS — K219 Gastro-esophageal reflux disease without esophagitis: Secondary | ICD-10-CM | POA: Diagnosis present

## 2017-05-30 DIAGNOSIS — G40209 Localization-related (focal) (partial) symptomatic epilepsy and epileptic syndromes with complex partial seizures, not intractable, without status epilepticus: Secondary | ICD-10-CM | POA: Diagnosis present

## 2017-05-30 DIAGNOSIS — Z981 Arthrodesis status: Secondary | ICD-10-CM | POA: Diagnosis not present

## 2017-05-30 DIAGNOSIS — D61818 Other pancytopenia: Secondary | ICD-10-CM | POA: Diagnosis not present

## 2017-05-30 DIAGNOSIS — E662 Morbid (severe) obesity with alveolar hypoventilation: Secondary | ICD-10-CM | POA: Diagnosis present

## 2017-05-30 DIAGNOSIS — I120 Hypertensive chronic kidney disease with stage 5 chronic kidney disease or end stage renal disease: Secondary | ICD-10-CM | POA: Diagnosis not present

## 2017-05-30 DIAGNOSIS — H9190 Unspecified hearing loss, unspecified ear: Secondary | ICD-10-CM | POA: Diagnosis present

## 2017-05-30 DIAGNOSIS — E1121 Type 2 diabetes mellitus with diabetic nephropathy: Secondary | ICD-10-CM

## 2017-05-30 DIAGNOSIS — Z7982 Long term (current) use of aspirin: Secondary | ICD-10-CM | POA: Diagnosis not present

## 2017-05-30 DIAGNOSIS — G609 Hereditary and idiopathic neuropathy, unspecified: Secondary | ICD-10-CM | POA: Diagnosis present

## 2017-05-30 DIAGNOSIS — R0602 Shortness of breath: Secondary | ICD-10-CM | POA: Diagnosis not present

## 2017-05-30 DIAGNOSIS — G4733 Obstructive sleep apnea (adult) (pediatric): Secondary | ICD-10-CM | POA: Diagnosis not present

## 2017-05-30 DIAGNOSIS — J9601 Acute respiratory failure with hypoxia: Secondary | ICD-10-CM

## 2017-05-30 DIAGNOSIS — Z885 Allergy status to narcotic agent status: Secondary | ICD-10-CM

## 2017-05-30 DIAGNOSIS — N2581 Secondary hyperparathyroidism of renal origin: Secondary | ICD-10-CM | POA: Diagnosis not present

## 2017-05-30 DIAGNOSIS — R0603 Acute respiratory distress: Secondary | ICD-10-CM | POA: Diagnosis not present

## 2017-05-30 DIAGNOSIS — J209 Acute bronchitis, unspecified: Secondary | ICD-10-CM | POA: Diagnosis not present

## 2017-05-30 DIAGNOSIS — Z6833 Body mass index (BMI) 33.0-33.9, adult: Secondary | ICD-10-CM

## 2017-05-30 DIAGNOSIS — Y95 Nosocomial condition: Secondary | ICD-10-CM | POA: Diagnosis present

## 2017-05-30 DIAGNOSIS — Z8701 Personal history of pneumonia (recurrent): Secondary | ICD-10-CM

## 2017-05-30 DIAGNOSIS — D631 Anemia in chronic kidney disease: Secondary | ICD-10-CM | POA: Diagnosis not present

## 2017-05-30 HISTORY — DX: Pneumonia, unspecified organism: J18.9

## 2017-05-30 LAB — PROCALCITONIN: PROCALCITONIN: 0.78 ng/mL

## 2017-05-30 LAB — CBC WITH DIFFERENTIAL/PLATELET
Basophils Absolute: 0 10*3/uL (ref 0.0–0.1)
Basophils Relative: 1 %
Eosinophils Absolute: 0 10*3/uL (ref 0.0–0.7)
Eosinophils Relative: 2 %
HCT: 28.7 % — ABNORMAL LOW (ref 39.0–52.0)
Hemoglobin: 9.3 g/dL — ABNORMAL LOW (ref 13.0–17.0)
LYMPHS ABS: 0.5 10*3/uL — AB (ref 0.7–4.0)
LYMPHS PCT: 18 %
MCH: 31.7 pg (ref 26.0–34.0)
MCHC: 32.4 g/dL (ref 30.0–36.0)
MCV: 98 fL (ref 78.0–100.0)
MONO ABS: 0.6 10*3/uL (ref 0.1–1.0)
Monocytes Relative: 25 %
Neutro Abs: 1.4 10*3/uL — ABNORMAL LOW (ref 1.7–7.7)
Neutrophils Relative %: 54 %
PLATELETS: 103 10*3/uL — AB (ref 150–400)
RBC: 2.93 MIL/uL — AB (ref 4.22–5.81)
RDW: 14 % (ref 11.5–15.5)
WBC: 2.5 10*3/uL — ABNORMAL LOW (ref 4.0–10.5)

## 2017-05-30 LAB — COMPREHENSIVE METABOLIC PANEL
ALT: 22 U/L (ref 17–63)
AST: 21 U/L (ref 15–41)
Albumin: 3.5 g/dL (ref 3.5–5.0)
Alkaline Phosphatase: 55 U/L (ref 38–126)
Anion gap: 14 (ref 5–15)
BILIRUBIN TOTAL: 0.8 mg/dL (ref 0.3–1.2)
BUN: 30 mg/dL — AB (ref 6–20)
CO2: 27 mmol/L (ref 22–32)
Calcium: 9.1 mg/dL (ref 8.9–10.3)
Chloride: 99 mmol/L — ABNORMAL LOW (ref 101–111)
Creatinine, Ser: 11.37 mg/dL — ABNORMAL HIGH (ref 0.61–1.24)
GFR calc Af Amer: 5 mL/min — ABNORMAL LOW (ref >60)
GFR calc non Af Amer: 4 mL/min — ABNORMAL LOW (ref >60)
GLUCOSE: 130 mg/dL — AB (ref 65–99)
POTASSIUM: 4 mmol/L (ref 3.5–5.1)
Sodium: 140 mmol/L (ref 135–145)
TOTAL PROTEIN: 6.7 g/dL (ref 6.5–8.1)

## 2017-05-30 LAB — I-STAT VENOUS BLOOD GAS, ED
Acid-Base Excess: 4 mmol/L — ABNORMAL HIGH (ref 0.0–2.0)
BICARBONATE: 27.2 mmol/L (ref 20.0–28.0)
O2 Saturation: 98 %
PCO2 VEN: 36.3 mmHg — AB (ref 44.0–60.0)
PH VEN: 7.483 — AB (ref 7.250–7.430)
PO2 VEN: 101 mmHg — AB (ref 32.0–45.0)
TCO2: 28 mmol/L (ref 22–32)

## 2017-05-30 LAB — I-STAT TROPONIN, ED: Troponin i, poc: 0.03 ng/mL (ref 0.00–0.08)

## 2017-05-30 LAB — I-STAT CG4 LACTIC ACID, ED: Lactic Acid, Venous: 1.43 mmol/L (ref 0.5–1.9)

## 2017-05-30 MED ORDER — ACETAMINOPHEN 650 MG RE SUPP: 650.0000 mg | Freq: Four times a day (QID) | RECTAL | Status: DC | PRN

## 2017-05-30 MED ORDER — ONDANSETRON HCL 4 MG PO TABS: 4.0000 mg | ORAL_TABLET | Freq: Four times a day (QID) | ORAL | Status: DC | PRN

## 2017-05-30 MED ORDER — ALBUTEROL SULFATE (2.5 MG/3ML) 0.083% IN NEBU
INHALATION_SOLUTION | RESPIRATORY_TRACT | Status: AC
  Filled 2017-05-30: qty 6

## 2017-05-30 MED ORDER — CLONAZEPAM 0.5 MG PO TABS
0.5000 mg | ORAL_TABLET | Freq: Two times a day (BID) | ORAL | Status: DC
  Administered 2017-05-31 – 2017-06-05 (×11): 0.5 mg via ORAL
  Filled 2017-05-30 (×11): qty 1

## 2017-05-30 MED ORDER — HEPARIN SODIUM (PORCINE) 5000 UNIT/ML IJ SOLN
5000.0000 [IU] | Freq: Three times a day (TID) | INTRAMUSCULAR | Status: DC
  Filled 2017-05-30 (×2): qty 1

## 2017-05-30 MED ORDER — CALCITRIOL 0.25 MCG PO CAPS: 0.2500 ug | ORAL_CAPSULE | ORAL | Status: DC

## 2017-05-30 MED ORDER — METHYLPREDNISOLONE SODIUM SUCC 125 MG IJ SOLR
60.0000 mg | Freq: Once | INTRAMUSCULAR | Status: AC
  Administered 2017-05-30: 60 mg via INTRAVENOUS
  Filled 2017-05-30: qty 2

## 2017-05-30 MED ORDER — VITAMIN D 1000 UNITS PO TABS
5000.0000 [IU] | ORAL_TABLET | Freq: Every day | ORAL | Status: DC
  Administered 2017-05-31 – 2017-06-05 (×6): 5000 [IU] via ORAL
  Filled 2017-05-30 (×6): qty 5

## 2017-05-30 MED ORDER — HYDROCODONE-ACETAMINOPHEN 5-325 MG PO TABS
1.0000 | ORAL_TABLET | ORAL | Status: DC | PRN
  Administered 2017-06-03: 1 via ORAL
  Filled 2017-05-30: qty 1

## 2017-05-30 MED ORDER — SODIUM CHLORIDE 0.9 % IV SOLN: 1.0000 g | Freq: Three times a day (TID) | INTRAVENOUS | Status: DC

## 2017-05-30 MED ORDER — PANTOPRAZOLE SODIUM 40 MG PO TBEC
40.0000 mg | DELAYED_RELEASE_TABLET | Freq: Two times a day (BID) | ORAL | Status: DC
  Administered 2017-05-31 – 2017-06-04 (×9): 40 mg via ORAL
  Filled 2017-05-30 (×9): qty 1

## 2017-05-30 MED ORDER — GUAIFENESIN-DM 100-10 MG/5ML PO SYRP
10.0000 mL | ORAL_SOLUTION | ORAL | Status: DC | PRN
  Administered 2017-06-02: 10 mL via ORAL
  Filled 2017-05-30: qty 10

## 2017-05-30 MED ORDER — CALCIUM ACETATE (PHOS BINDER) 667 MG PO CAPS
5336.0000 mg | ORAL_CAPSULE | Freq: Three times a day (TID) | ORAL | Status: DC
  Administered 2017-05-31 – 2017-06-04 (×13): 5336 mg via ORAL
  Filled 2017-05-30 (×16): qty 8

## 2017-05-30 MED ORDER — PRAVASTATIN SODIUM 40 MG PO TABS
40.0000 mg | ORAL_TABLET | Freq: Every day | ORAL | Status: DC
  Administered 2017-05-31 – 2017-06-04 (×5): 40 mg via ORAL
  Filled 2017-05-30 (×5): qty 1

## 2017-05-30 MED ORDER — SODIUM CHLORIDE 0.9% FLUSH
3.0000 mL | Freq: Two times a day (BID) | INTRAVENOUS | Status: DC
  Administered 2017-05-30: via INTRAVENOUS
  Administered 2017-05-31 – 2017-06-04 (×10): 3 mL via INTRAVENOUS

## 2017-05-30 MED ORDER — SODIUM CHLORIDE 0.9% FLUSH: 3.0000 mL | INTRAVENOUS | Status: DC | PRN

## 2017-05-30 MED ORDER — SODIUM CHLORIDE 0.9 % IV SOLN: 250.0000 mL | INTRAVENOUS | Status: DC | PRN

## 2017-05-30 MED ORDER — METOPROLOL TARTRATE 50 MG PO TABS
75.0000 mg | ORAL_TABLET | Freq: Two times a day (BID) | ORAL | Status: DC
  Administered 2017-05-31 – 2017-06-05 (×11): 75 mg via ORAL
  Filled 2017-05-30 (×11): qty 1

## 2017-05-30 MED ORDER — SODIUM CHLORIDE 0.9 % IV SOLN
2000.0000 mg | Freq: Once | INTRAVENOUS | Status: AC
  Administered 2017-05-30: 2000 mg via INTRAVENOUS
  Filled 2017-05-30: qty 2000

## 2017-05-30 MED ORDER — ONDANSETRON HCL 4 MG/2ML IJ SOLN: 4.0000 mg | Freq: Four times a day (QID) | INTRAMUSCULAR | Status: DC | PRN

## 2017-05-30 MED ORDER — IOPAMIDOL (ISOVUE-370) INJECTION 76%
INTRAVENOUS | Status: AC
  Administered 2017-05-30: 100 mL via INTRAVENOUS
  Filled 2017-05-30: qty 100

## 2017-05-30 MED ORDER — SODIUM CHLORIDE 0.9 % IV SOLN
2.0000 g | Freq: Once | INTRAVENOUS | Status: AC
  Administered 2017-05-30: 2 g via INTRAVENOUS
  Filled 2017-05-30: qty 2

## 2017-05-30 MED ORDER — ACETAMINOPHEN 325 MG PO TABS: 650.0000 mg | ORAL_TABLET | Freq: Four times a day (QID) | ORAL | Status: DC | PRN

## 2017-05-30 MED ORDER — SENNOSIDES-DOCUSATE SODIUM 8.6-50 MG PO TABS: 1.0000 | ORAL_TABLET | Freq: Every evening | ORAL | Status: DC | PRN

## 2017-05-30 MED ORDER — SODIUM CHLORIDE 0.9% FLUSH
3.0000 mL | Freq: Two times a day (BID) | INTRAVENOUS | Status: DC
  Administered 2017-05-30 – 2017-05-31 (×3): 3 mL via INTRAVENOUS

## 2017-05-30 MED ORDER — HYDRALAZINE HCL 50 MG PO TABS
50.0000 mg | ORAL_TABLET | Freq: Three times a day (TID) | ORAL | Status: DC
  Administered 2017-05-31 – 2017-06-05 (×15): 50 mg via ORAL
  Filled 2017-05-30 (×16): qty 1

## 2017-05-30 MED ORDER — ALBUTEROL SULFATE (2.5 MG/3ML) 0.083% IN NEBU: 2.5000 mg | INHALATION_SOLUTION | RESPIRATORY_TRACT | Status: DC | PRN

## 2017-05-30 MED ORDER — CINACALCET HCL 30 MG PO TABS
30.0000 mg | ORAL_TABLET | Freq: Every day | ORAL | Status: DC
  Administered 2017-05-31 – 2017-06-04 (×5): 30 mg via ORAL
  Filled 2017-05-30 (×5): qty 1

## 2017-05-30 MED ORDER — DIPHENHYDRAMINE HCL 25 MG PO CAPS: 25.0000 mg | ORAL_CAPSULE | Freq: Every evening | ORAL | Status: DC | PRN

## 2017-05-30 MED ORDER — ALBUTEROL SULFATE (2.5 MG/3ML) 0.083% IN NEBU
5.0000 mg | INHALATION_SOLUTION | Freq: Once | RESPIRATORY_TRACT | Status: AC
  Administered 2017-05-30: 5 mg via RESPIRATORY_TRACT

## 2017-05-30 MED ORDER — BISACODYL 5 MG PO TBEC: 5.0000 mg | DELAYED_RELEASE_TABLET | Freq: Every day | ORAL | Status: DC | PRN

## 2017-05-30 MED ORDER — CALCIUM ACETATE (PHOS BINDER) 667 MG PO CAPS
2668.0000 mg | ORAL_CAPSULE | ORAL | Status: DC | PRN
  Administered 2017-06-05: 2001 mg via ORAL
  Filled 2017-05-30: qty 4

## 2017-05-30 MED ORDER — ASPIRIN EC 81 MG PO TBEC
81.0000 mg | DELAYED_RELEASE_TABLET | Freq: Every day | ORAL | Status: DC
  Administered 2017-05-31 – 2017-06-05 (×6): 81 mg via ORAL
  Filled 2017-05-30 (×6): qty 1

## 2017-05-30 MED ORDER — MOMETASONE FURO-FORMOTEROL FUM 100-5 MCG/ACT IN AERO
2.0000 | INHALATION_SPRAY | Freq: Two times a day (BID) | RESPIRATORY_TRACT | Status: DC
  Administered 2017-05-31 – 2017-06-04 (×8): 2 via RESPIRATORY_TRACT
  Filled 2017-05-30: qty 8.8

## 2017-05-30 NOTE — Progress Notes (Signed)
Pharmacy Antibiotic Note  Kirk [name] is a 57 y.o. male admitted on 05/30/2017 with pneumonia.   Plan: Vanc 2 g x 1 Cefepime 2 g x 1 Monitor HD schedule for further dosing cx vanc lvls prn  Weight: 210 lb 12.2 oz (95.6 kg)(stood to scale )  Temp (24hrs), Avg:98.3 F (36.8 C), Min:97.1 F (36.2 C), Max:99.5 F (37.5 C)  Recent Labs  Lab 05/30/17 1448 05/30/17 1507  WBC 2.5*  --   CREATININE 11.37*  --   LATICACIDVEN  --  1.43    Estimated Creatinine Clearance: 8.1 mL/min (A) (by C-G formula based on SCr of 11.37 mg/dL (H)).    Allergies  Allergen Reactions  . Codeine Nausea And Vomiting and Nausea Only    Altered mental status   Levester Fresh, PharmD, BCPS, BCCCP Clinical Pharmacist Clinical phone for 05/30/2017 from 1430 [phone]: [phone] If after 2300, please call main pharmacy at: [phone] 05/30/2017 10:37 PM

## 2017-05-30 NOTE — Progress Notes (Signed)
Patient completed hemodialysis treatment goal of 5 liters not met. Only able to pull  3.3 liters. Patient was not able to be  weaned off bipap. With Respiratory assistance was able to get a standing weight.  Per patient significantly below dry weight.  Patient has intense pain in lower back. Alerted ED CN [place] that patient is due to return to ED to be reevaluated.

## 2017-05-30 NOTE — Progress Notes (Signed)
Pt taken off BIPAP per patient request and placed on 3L nasal cannula.

## 2017-05-30 NOTE — ED Provider Notes (Signed)
Patient previously seen by ED staff, please see their documentation for complete history and physical.  I have reviewed documentation and agree with previous providers assessment.  Patient is a 57 y.o. with past medical history as below.    Patient sent to dialysis with concern for fluid overload as he was requiring BiPAP.  Chest x-ray showed no evidence of pulmonary edema.  Following dialysis, patient still requiring BiPAP.  Dialysis today they were unable to remove the full 5 L as intended.  On examination, patient has obvious shortness of breath.  Breath sounds clear bilaterally without wheezes or rales.  Given patient's continued shortness of breath, will collect CTA of the chest to evaluate for pulmonary embolus.  CTA shows no evidence of pulmonary embolus.  Patient with previously imaged right upper lobe opacity which is improved compared to prior.  No clear reason for patient's continued shortness of breath.  Will admit patient to the hospitalist for further evaluation and care.  Past Medical History:  Diagnosis Date  . Acute edema of lung, unspecified   . Acute, but ill-defined, cerebrovascular disease   . Allergy   . Anemia   . 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)   . Anxiety   . Asthma   . Carpal tunnel syndrome   . Cellulitis and abscess of trunk   . Cholelithiasis 07/13/2014  . Chronic headaches   . Debility, unspecified   . Dermatophytosis of the body   . Dysrhythmia    history of  . Edema   . End stage renal disease on dialysis [facility])    "MWF; [facility]" (10/21/2014)  . Essential hypertension, benign   . GERD (gastroesophageal reflux disease)   . Gout, unspecified   . HTN (hypertension)   . Hypertrophy of prostate without urinary obstruction and other lower urinary tract symptoms (LUTS)   . Hypotension, unspecified   . Impotence of organic origin   . Insomnia, unspecified   . Kidney replaced by transplant   . Localization-related (focal)  (partial) epilepsy and epileptic syndromes with complex partial seizures, without mention of intractable epilepsy   . Lumbago   . Memory loss   . OSA on CPAP   . Other and unspecified hyperlipidemia   . Other chronic nonalcoholic liver disease   . Other malaise and fatigue   . Other nonspecific abnormal serum enzyme levels   . Pain in joint, lower leg   . Pain in joint, upper arm   . Pneumonia "several times"  . Renal dialysis status(V45.11) 02/05/2010   restarted 01/02/13 ofter renal trransplant failure  . Secondary hyperparathyroidism (of renal origin)   . Shortness of breath   . Sleep apnea   . Tension headache   . Unspecified constipation   . Unspecified essential hypertension   . Unspecified hereditary and idiopathic peripheral neuropathy   . Unspecified vitamin D deficiency        Chapman Moss, MD 05/31/17 [phone]

## 2017-05-30 NOTE — Progress Notes (Signed)
Location:  [facility] clinic Provider:  Veora Fonte L. Mariea Clonts, D.O., C.M.D.  Code Status: FULL CODE Goals of Care:  Advanced Directives 05/30/2017  Does Patient Have a Medical Advance Directive? No  Does patient want to make changes to medical advance directive? -  Would patient like information on creating a medical advance directive? No - Patient declined  Pre-existing out of facility DNR order (yellow form or pink MOST form) -     Chief Complaint  Patient presents with  . Medical Management of Chronic Issues    follow-up, blood in urine    HPI: Patient is a 57 y.o. Kirk [name] with h/o ESRD on HD, prior renal transplant, recent pneumonia seen today for medical management of chronic diseases.    He wanted to go to the emergency room last night due to shortness of breath--could not breathe.  He's uncomfortable.  His bp is sky high.  He's got a low grade temp.  He was meant to see me after his hospitalization from 04/29/17-05/02/17 for sepsis secondary to pneumonia. He was treated with rocephin and zpak.  It was also considered a COPD exacerbation and he was given IV solumedrol and then changed to prednisone for 3 more days.  He is on bronchodilators and was started on pulmicort last hospitalization.    Today, he's very short of breath and his sats are .  He's lethargic and having difficulty speaking.  He's clammy with cold sweats.  He has a low grade temp.  His POX was 96% on 4L O2.  He's coughing up discolored sputum.  He's not moving air well.    It's been off and on shortness of breath for several days per his wife.  He had hematuria though and that's why they wanted him seen here--discussed that this is true, but he is too ill to be managed outpatient and needs to proceed to the ED for readmission.    Past Medical History:  Diagnosis Date  . Acute edema of lung, unspecified   . Acute, but ill-defined, cerebrovascular disease   . Allergy   . Anemia   . 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)   .  Anxiety   . Asthma   . Carpal tunnel syndrome   . Cellulitis and abscess of trunk   . Cholelithiasis 07/13/2014  . Chronic headaches   . Debility, unspecified   . Dermatophytosis of the body   . Dysrhythmia    history of  . Edema   . End stage renal disease on dialysis [facility])    "MWF; [facility]" (10/21/2014)  . Essential hypertension, benign   . GERD (gastroesophageal reflux disease)   . Gout, unspecified   . HTN (hypertension)   . Hypertrophy of prostate without urinary obstruction and other lower urinary tract symptoms (LUTS)   . Hypotension, unspecified   . Impotence of organic origin   . Insomnia, unspecified   . Kidney replaced by transplant   . Localization-related (focal) (partial) epilepsy and epileptic syndromes with complex partial seizures, without mention of intractable epilepsy   . Lumbago   . Memory loss   . OSA on CPAP   . Other and unspecified hyperlipidemia   . Other chronic nonalcoholic liver disease   . Other malaise and fatigue   . Other nonspecific abnormal serum enzyme levels   . Pain in joint, lower leg   . Pain in joint, upper arm   . Pneumonia "several times"  . Renal dialysis status(V45.11) 02/05/2010   restarted  01/02/13 ofter renal trransplant failure  . Secondary hyperparathyroidism (of renal origin)   . Shortness of breath   . Sleep apnea   . Tension headache   . Unspecified constipation   . Unspecified essential hypertension   . Unspecified hereditary and idiopathic peripheral neuropathy   . Unspecified vitamin D deficiency     Past Surgical History:  Procedure Laterality Date  . AV FISTULA PLACEMENT Left ?2010   "forearm; at [facility]"  . BACK SURGERY    . CARDIAC CATHETERIZATION  03/21/2011  . CHOLECYSTECTOMY N/A 10/21/2014   Procedure: LAPAROSCOPIC CHOLECYSTECTOMY WITH INTRAOPERATIVE CHOLANGIOGRAM;  Surgeon: Autumn Messing III, MD;  Location: [place];  Service: General;  Laterality: N/A;  . COLONOSCOPY    .  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 KIDNEY TRANSPLANT  08/17/2011   [place]  10/21/2014   w/IOC  . LEFT HEART CATHETERIZATION WITH CORONARY ANGIOGRAM N/A 03/21/2011   Procedure: LEFT HEART CATHETERIZATION WITH CORONARY ANGIOGRAM;  Surgeon: [name], MD;  Location: [facility] CATH LAB;  Service: Cardiovascular;  Laterality: N/A;  . NEPHRECTOMY  08/2013   removed transplaned kidney  . POSTERIOR FUSION CERVICAL SPINE  06/25/2012   for spinal stenosis  . VASECTOMY  2010    Allergies  Allergen Reactions  . Codeine Nausea And Vomiting and Nausea Only    Altered mental status    Outpatient Encounter Medications as of 05/30/2017  Medication Sig  . acetaminophen (TYLENOL) 500 MG tablet Take 1 tablet (500 mg total) by mouth every 6 (six) hours as needed for moderate pain.  Marland Kitchen aspirin EC 81 MG tablet Take 81 mg by mouth daily.  Marland Kitchen b complex-vitamin c-folic acid (NEPHRO-VITE) 0.8 MG TABS tablet TAKE 1 TABLET BY MOUTH EVERY DAY  . budesonide-formoterol (SYMBICORT) 80-4.5 MCG/ACT inhaler Inhale 2 puffs into the lungs 2 (two) times daily.  . calcitRIOL (ROCALTROL) 0.25 MCG capsule Take 1 capsule (0.25 mcg total) by mouth every Monday, Wednesday, and Friday with hemodialysis.  Marland Kitchen calcium acetate (PHOSLO) 667 MG capsule Take by mouth 3 (three) times daily with meals. Take 4 capsule with snacks and 8 capsules with meals.  . Cholecalciferol (VITAMIN D3) 5000 UNITS TABS Take 1 tablet by mouth daily.  . cinacalcet (SENSIPAR) 30 MG tablet Take 30 mg by mouth daily. With dinner.  . clonazePAM (KLONOPIN) 0.5 MG tablet Take 1 tablet (0.5 mg total) by mouth 2 (two) times daily.  . diphenhydrAMINE (BENADRYL) 25 MG tablet Take 25 mg by mouth at bedtime as needed for allergies.  Marland Kitchen guaiFENesin (MUCINEX) 600 MG 12 hr tablet Take 600 mg by mouth 2 (two) times daily.  Marland Kitchen guaiFENesin-dextromethorphan (ROBITUSSIN DM) 100-10 MG/5ML syrup Take 10 mLs by mouth every 4 (four)  hours as needed for cough.  . hydrALAZINE (APRESOLINE) 50 MG tablet Take 1 tablet (50 mg total) by mouth every 8 (eight) hours.  . metoprolol tartrate (LOPRESSOR) 50 MG tablet Take 75 mg by mouth 2 (two) times daily.  Marland Kitchen omeprazole (PRILOSEC) 20 MG capsule TAKE 1 CAPSULE BY MOUTH 2 TIMES DAILY BEFORE A MEAL.  Marland Kitchen ondansetron (ZOFRAN ODT) 4 MG disintegrating tablet Take 1 tablet (4 mg total) by mouth every 8 (eight) hours as needed for nausea or vomiting.  . pravastatin (PRAVACHOL) 40 MG tablet TAKE 1 TABLET EVERY EVENING  . traMADol (ULTRAM) 50 MG tablet Take 1 tablet (50 mg total) by mouth daily as needed (severe headache).  . [DISCONTINUED] azithromycin (  ZITHROMAX) 500 MG tablet Take 1 tablet (500 mg total) by mouth daily.  . [DISCONTINUED] cefdinir (OMNICEF) 300 MG capsule Take 1 capsule (300 mg total) by mouth daily.  . [DISCONTINUED] metoprolol tartrate (LOPRESSOR) 50 MG tablet Take One and a half tablet by mouth twice daily (Patient taking differently: Take 75 mg by mouth 2 (two) times daily. Take One and a half tablet by mouth twice daily)  . [DISCONTINUED] predniSONE (DELTASONE) 50 MG tablet Take 1 tablet (50 mg total) by mouth daily with breakfast.   No facility-administered encounter medications on file as of 05/30/2017.     Review of Systems:  Review of Systems  Constitutional: Positive for chills, fever and malaise/fatigue.  HENT: Positive for hearing loss.        Alports syndrome  Eyes: Negative for blurred vision.       Glasses  Respiratory: Positive for cough, hemoptysis, sputum production, shortness of breath and wheezing.        Coughed up blood last night  Cardiovascular: Negative for chest pain and palpitations.  Gastrointestinal: Negative for abdominal pain.       Recent C diff  Genitourinary: Positive for hematuria. Negative for dysuria.  Musculoskeletal: Negative for falls and myalgias.  Skin: Negative for rash.  Neurological: Negative for dizziness and loss of  consciousness.    Health Maintenance  Topic Date Due  . OPHTHALMOLOGY EXAM  01/29/1972  . FOOT EXAM  04/29/2014  . PNEUMOCOCCAL POLYSACCHARIDE VACCINE (2) 11/01/2015  . TETANUS/TDAP  01/23/2016  . URINE MICROALBUMIN  02/14/2017  . HEMOGLOBIN A1C  05/03/2017  . INFLUENZA VACCINE  08/22/2017  . COLONOSCOPY  08/30/2019  . Hepatitis C Screening  Completed  . HIV Screening  Completed    Physical Exam: Vitals:   05/30/17 1319  BP: (!) 200/100  Pulse: 82  Temp: 99.5 F (37.5 C)  TempSrc: Oral  SpO2: 96%  Weight: 222 lb (100.7 kg)  Height: 5\' 7"  (1.702 m)   Body mass index is 34.77 kg/m. Physical Exam  Constitutional: He appears distressed.  Appears overtly dyspneic  Eyes:  glasses  Cardiovascular: Normal rate, regular rhythm and normal heart sounds.  Pulmonary/Chest: He is in respiratory distress. He has wheezes.  Poor air movement, rhonchi  Abdominal: Bowel sounds are normal. He exhibits no distension.  Musculoskeletal: Normal range of motion.  Neurological:  lethargic  Skin: Skin is warm.  diaphoretic    Labs reviewed: Basic Metabolic Panel: Recent Labs    11/05/16 0509 11/06/16 0500 11/07/16 0549  01/29/17 0408 01/30/17 0422  04/29/17 1134 04/30/17 0449 05/01/17 0455  NA 135 134* 132*   < > 137 135   < > 138 138 140  K 4.2 4.5 5.0   < > 4.6 4.9   < > 3.1* 4.5 3.4*  CL 94* 94* 93*   < > 96* 94*   < > 94* 98* 98*  CO2 22 26 21*   < > 24 20*   < > 29 22 23   GLUCOSE 102* 99 120*   < > 168* 175*   < > 136* 118* 126*  BUN 80* 42* 59*   < > 58* 95*   < > 22* 38* 56*  CREATININE 18.16* 13.19* 15.82*   < > 13.80* 16.27*   < > 7.57* 11.33* 13.83*  CALCIUM 7.7* 8.4* 7.9*   < > 9.1 9.3   < > 8.6* 8.5* 8.8*  MG 2.5* 2.3 2.3  --   --   --   --   --   --   --  PHOS  --   --   --   --  7.1* 7.9*  --   --   --  9.0*   < > = values in this interval not displayed.   Liver Function Tests: Recent Labs    02/19/17 1341 04/29/17 1134 04/30/17 0449 05/01/17 0455    AST 21 29 28   --   ALT 20 28 21   --   ALKPHOS 69 69 54  --   BILITOT 0.7 0.7 0.9  --   PROT 7.9 8.5* 7.0  --   ALBUMIN 4.1 4.1 3.4* 3.2*   Recent Labs    10/26/16 1705 11/02/16 0801 02/19/17 1341  LIPASE 89* Kirk* 43   No results for input(s): AMMONIA in the last 8760 hours. CBC: Recent Labs    01/28/17 0710  04/29/17 1134 04/30/17 0449 05/01/17 0455  WBC 4.4   < > 5.5 3.3* 3.3*  NEUTROABS 3.5  --  4.5  --  2.1  HGB 10.3*   < > 10.3* 9.2* 8.5*  HCT 32.4*   < > 31.4* 28.7* 25.9*  MCV 96.4   < > 97.2 99.7 98.1  PLT 67*   < > 86* 73* 63*   < > = values in this interval not displayed.   Lipid Panel: No results for input(s): CHOL, HDL, LDLCALC, TRIG, CHOLHDL, LDLDIRECT in the last 8760 hours. Lab Results  Component Value Date   HGBA1C 5.4 11/02/2016    Procedures since last visit: Ct Head Wo Contrast  Result Date: 05/01/2017 CLINICAL DATA:  In stage renal disease. Fever, productive cough, headache, nausea and poor appetite. LEFT-sided headaches since 9 a.m. this morning. EXAM: CT HEAD WITHOUT CONTRAST TECHNIQUE: Contiguous axial images were obtained from the base of the skull through the vertex without intravenous contrast. COMPARISON:  Head CT dated 02/19/2017. brain MRI dated 06/22/2010. FINDINGS: Brain: Ventricles are normal in size and configuration. Mild chronic small vessel ischemic changes again noted within the periventricular white matter. No mass, hemorrhage, edema or other evidence of acute parenchymal abnormality. No extra-axial hemorrhage. Vascular: There are chronic calcified atherosclerotic changes of the large vessels at the skull base. No unexpected hyperdense vessel. Skull: Normal. Negative for fracture or focal lesion. Sinuses/Orbits: No acute finding. Other: None. IMPRESSION: 1. No acute findings.  No intracranial mass, hemorrhage or edema. 2. Mild chronic small vessel ischemic change in the white matter. Electronically Signed   By: Franki Cabot M.D.   On:  05/01/2017 14:57    Assessment/Plan 1. Acute respiratory failure with hypoxemia (HCC) -acute, is not doing well, in resp distress, hypoxic and poor air movement on exam, unable to speak, lethargic -sent to ED via EMS  2. ESRD on dialysis [facility]) -ongoing, prior renal transplant failure  3. Type 2 diabetes mellitus with diabetic nephropathy, without long-term current use of insulin (HCC) - Lab Results  Component Value Date   HGBA1C 5.4 11/02/2016    4. 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 OSA on CPAP -ongoing, contributes to his respiratory issues along with his COPD  6.  Uncontrolled hypertension -bp sky high today, but reportedly had meds, suspect due to his resp distress  Hematuria issue not addressed today due to need for acute hospitalization  Labs/tests ordered:  None--sent to hospital  Next appt:  F/u post-hospitalization   Alisabeth Selkirk L. Eshawn Coor, D.O. [facility] [address], [place] [address] Cell Phone (Mon-Fri 8am-5pm):  [phone] On Call:  [phone] & follow prompts after 5pm & weekends Office Phone:  [phone] Office Fax:  [phone]

## 2017-05-30 NOTE — ED Triage Notes (Signed)
Pt to ER from PCP - EMS activated for respiratory distress - patient reports 3 days of not feeling well. Recently hospitalized for pneumonia. On arrival accessory muscle use noted + wheezing and rales. Pt is alert. VSS

## 2017-05-30 NOTE — ED Provider Notes (Signed)
[facility] Provider Note   CSN: [id number] Arrival date & time: 05/30/17  1425     History   Chief Complaint Chief Complaint  Patient presents with  . Respiratory Distress    HPI Kirk [name] is a 57 y.o. male.  HPI   Patient is a 57 year old male end-stage renal disease on dialysis.  Last had dialysis yesterday but had less than usual.  He is here today with acute shortness of breath.  Patient is in acute respiratory failure on arrival.  Patient reports that he got normal dialysis yesterday but he says that he "did not get much taken off his usual".  Patient reports that he had a resolving pneumonia.  Wife reports that he had a little bit of blood in his urine.  Awaiting urine sample here.  Past Medical History:  Diagnosis Date  . Acute edema of lung, unspecified   . Acute, but ill-defined, cerebrovascular disease   . Allergy   . Anemia   . 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)   . Anxiety   . Asthma   . Carpal tunnel syndrome   . Cellulitis and abscess of trunk   . Cholelithiasis 07/13/2014  . Chronic headaches   . Debility, unspecified   . Dermatophytosis of the body   . Dysrhythmia    history of  . Edema   . End stage renal disease on dialysis [facility])    "MWF; [facility]" (10/21/2014)  . Essential hypertension, benign   . GERD (gastroesophageal reflux disease)   . Gout, unspecified   . HTN (hypertension)   . Hypertrophy of prostate without urinary obstruction and other lower urinary tract symptoms (LUTS)   . Hypotension, unspecified   . Impotence of organic origin   . Insomnia, unspecified   . Kidney replaced by transplant   . Localization-related (focal) (partial) epilepsy and epileptic syndromes with complex partial seizures, without mention of intractable epilepsy   . Lumbago   . Memory loss   . OSA on CPAP   . Other and unspecified hyperlipidemia   . Other chronic nonalcoholic liver  disease   . Other malaise and fatigue   . Other nonspecific abnormal serum enzyme levels   . Pain in joint, lower leg   . Pain in joint, upper arm   . Pneumonia "several times"  . Renal dialysis status(V45.11) 02/05/2010   restarted 01/02/13 ofter renal trransplant failure  . Secondary hyperparathyroidism (of renal origin)   . Shortness of breath   . Sleep apnea   . Tension headache   . Unspecified constipation   . Unspecified essential hypertension   . Unspecified hereditary and idiopathic peripheral neuropathy   . Unspecified vitamin D deficiency     Patient Active Problem List   Diagnosis Date Noted  . ESRD (end stage renal disease) ([place]) 05/30/2017  . Hypokalemia   . Sepsis due to undetermined organism ([place]) 05/01/2017  . Lobar pneumonia ([place]) 05/01/2017  . COPD with acute exacerbation ([place]) 05/01/2017  . Fever 04/29/2017  . Acute respiratory failure with hypoxia ([place]) 01/28/2017  . Sepsis ([place]) 01/28/2017  . 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  . Hyponatremia   . Acute respiratory failure with hypoxemia ([place]) 10/31/2016  . Clostridium difficile enteritis 10/31/2016  . Pulmonary edema 09/13/2016  . Shunt malfunction 07/03/2016  . Nausea without vomiting 12/14/2015  . End stage renal disease ([place])   . Palpitations 06/29/2015  . Need for hepatitis C screening test 06/29/2015  . Fatigue  06/29/2015  . Myalgia 05/17/2015  . 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 Obesity hypoventilation syndrome ([place]) 12/09/2014  . Extrinsic asthma 12/09/2014  . Narcotic drug use 12/09/2014  . Hypersomnia with sleep apnea 12/09/2014  . SCC (squamous cell carcinoma), arm 11/09/2014  . Mild persistent chronic asthma without complication 97/35/3299  . ESRD on dialysis ([place]) 09/09/2014  . HLD (hyperlipidemia) 09/09/2014  . Chest pain 09/09/2014  . Dyspnea 06/23/2014  . Pancytopenia ([place]) 04/28/2014  . Thrombocytopenia ([place]) 04/10/2014  . HCAP (healthcare-associated pneumonia) 04/07/2014  .  Fungal dermatitis 03/17/2014  . Abdominal pain   . History of surgical procedure 09/17/2013  . Headache(784.0) 04/28/2013  . Awaiting organ transplant 02/13/2013  . HTN (hypertension)   . DM (diabetes mellitus), type 2 with renal complications ([place])   . Tension headache   . Memory loss   . Anemia in chronic renal disease   . Edema   . Pain in joint, lower leg 05/08/2012  . Thoracic or lumbosacral neuritis or radiculitis 03/27/2012  . Nerve root pain 03/27/2012  . History of kidney transplant 09/10/2011  . Hearing loss 08/16/2011  . Obesity 08/16/2011  . Difficulty hearing 08/16/2011  . OSA on CPAP 08/05/2011    Past Surgical History:  Procedure Laterality Date  . AV FISTULA PLACEMENT Left ?2010   "forearm; at [facility]"  . BACK SURGERY    . CARDIAC CATHETERIZATION  03/21/2011  . CHOLECYSTECTOMY N/A 10/21/2014   Procedure: LAPAROSCOPIC CHOLECYSTECTOMY WITH INTRAOPERATIVE CHOLANGIOGRAM;  Surgeon: Autumn Messing III, MD;  Location: [facility];  Service: General;  Laterality: N/A;  . COLONOSCOPY    . 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 KIDNEY TRANSPLANT  08/17/2011   [place]  10/21/2014   w/IOC  . LEFT HEART CATHETERIZATION WITH CORONARY ANGIOGRAM N/A 03/21/2011   Procedure: LEFT HEART CATHETERIZATION WITH CORONARY ANGIOGRAM;  Surgeon: [name], MD;  Location: [facility] CATH LAB;  Service: Cardiovascular;  Laterality: N/A;  . NEPHRECTOMY  08/2013   removed transplaned kidney  . POSTERIOR FUSION CERVICAL SPINE  06/25/2012   for spinal stenosis  . VASECTOMY  2010        Home Medications    Prior to Admission medications   Medication Sig Start Date End Date Taking? Authorizing Provider  acetaminophen (TYLENOL) 500 MG tablet Take 1 tablet (500 mg total) by mouth every 6 (six) hours as needed for moderate pain. 09/12/14   Samuella Cota, MD  aspirin EC 81 MG tablet Take 81 mg by mouth daily.    [provider]  b complex-vitamin c-folic acid (NEPHRO-VITE) 0.8 MG TABS tablet TAKE 1 TABLET BY MOUTH EVERY DAY 12/03/16   Reed, Tiffany L, DO  budesonide-formoterol (SYMBICORT) 80-4.5 MCG/ACT inhaler Inhale 2 puffs into the lungs 2 (two) times daily. 01/30/17   Deatra James, MD  calcitRIOL (ROCALTROL) 0.25 MCG capsule Take 1 capsule (0.25 mcg total) by mouth every Monday, Wednesday, and Friday with hemodialysis. 09/21/14   Bonnielee Haff, MD  calcium acetate (PHOSLO) 667 MG capsule Take by mouth 3 (three) times daily with meals. Take 4 capsule with snacks and 8 capsules with meals.    [provider]  Cholecalciferol (VITAMIN D3) 5000 UNITS TABS Take 1 tablet by mouth daily.    [provider]  cinacalcet (SENSIPAR) 30 MG tablet Take 30 mg by mouth daily. With dinner.    [provider]  clonazePAM (KLONOPIN) 0.5 MG tablet  Take 1 tablet (0.5 mg total) by mouth 2 (two) times daily. 09/22/16   Orvan Falconer, MD  diphenhydrAMINE (BENADRYL) 25 MG tablet Take 25 mg by mouth at bedtime as needed for allergies.    [provider]  guaiFENesin (MUCINEX) 600 MG 12 hr tablet Take 600 mg by mouth 2 (two) times daily.    [provider]  guaiFENesin-dextromethorphan (ROBITUSSIN DM) 100-10 MG/5ML syrup Take 10 mLs by mouth every 4 (four) hours as needed for cough.    [provider]  hydrALAZINE (APRESOLINE) 50 MG tablet Take 1 tablet (50 mg total) by mouth every 8 (eight) hours. 09/22/16   Orvan Falconer, MD  metoprolol tartrate (LOPRESSOR) 50 MG tablet Take 75 mg by mouth 2 (two) times daily.    [provider]  omeprazole (PRILOSEC) 20 MG capsule TAKE 1 CAPSULE BY MOUTH 2 TIMES DAILY BEFORE A MEAL. 12/26/16   Milus Banister, MD  ondansetron (ZOFRAN ODT) 4 MG disintegrating tablet Take 1 tablet (4 mg total) by mouth every 8 (eight) hours as needed for nausea or vomiting. 10/26/16   Kinnie Feil, PA-C  pravastatin (PRAVACHOL) 40 MG tablet TAKE 1 TABLET EVERY  EVENING 01/03/17   Reed, Tiffany L, DO  traMADol (ULTRAM) 50 MG tablet Take 1 tablet (50 mg total) by mouth daily as needed (severe headache). 10/08/16   Gayland Curry, DO    Family History Family History  Adopted: Yes  Problem Relation Age of Onset  . Colon cancer Neg Hx   . Esophageal cancer Neg Hx   . Rectal cancer Neg Hx   . Stomach cancer Neg Hx     Social History Social History   Tobacco Use  . Smoking status: Current Every Day Smoker    Packs/day: 0.50    Years: 32.00    Pack years: 16.00    Types: E-cigarettes    Last attempt to quit: 08/23/2014    Years since quitting: 2.7  . Smokeless tobacco: Never Used  Substance Use Topics  . Alcohol use: No    Alcohol/week: 0.0 oz  . Drug use: No     Allergies   Codeine   Review of Systems Review of Systems  Constitutional: Positive for fatigue. Negative for activity change.  Respiratory: Positive for cough, chest tightness and shortness of breath.   Cardiovascular: Positive for chest pain.  Gastrointestinal: Negative for abdominal pain.  All other systems reviewed and are negative.    Physical Exam Updated Vital Signs BP (!) 210/101   Pulse 74   Temp (!) 97.1 F (36.2 C) (Oral)   Resp 19   SpO2 100%   Physical Exam  Constitutional: He is oriented to person, place, and time. He appears well-nourished.  HENT:  Head: Normocephalic.  Eyes: Pupils are equal, round, and reactive to light. Conjunctivae are normal.  Neck: Normal range of motion.  Cardiovascular: Normal rate and regular rhythm.  Pulmonary/Chest:  Crackles diffusely.  Abdominal: Soft. He exhibits no distension. There is no tenderness.  Neurological: He is oriented to person, place, and time.  Skin: Skin is warm and dry. He is not diaphoretic.  Psychiatric: He has a normal mood and affect. His behavior is normal.     ED Treatments / Results  Labs (all labs ordered are listed, but only abnormal results are displayed) Labs Reviewed  CBC  WITH DIFFERENTIAL/PLATELET - Abnormal; Notable for the following components:      Result Value   WBC 2.5 (*)  RBC 2.93 (*)    Hemoglobin 9.3 (*)    HCT 28.7 (*)    Neutro Abs 1.4 (*)    Lymphs Abs 0.5 (*)    All other components within normal limits  COMPREHENSIVE METABOLIC PANEL - Abnormal; Notable for the following components:   Chloride 99 (*)    Glucose, Bld 130 (*)    BUN 30 (*)    Creatinine, Ser 11.37 (*)    GFR calc non Af Amer 4 (*)    GFR calc Af Amer 5 (*)    All other components within normal limits  I-STAT VENOUS BLOOD GAS, ED - Abnormal; Notable for the following components:   pH, Ven 7.483 (*)    pCO2, Ven 36.3 (*)    pO2, Ven 101.0 (*)    Acid-Base Excess 4.0 (*)    All other components within normal limits  I-STAT TROPONIN, ED  I-STAT CG4 LACTIC ACID, ED    EKG EKG Interpretation  Date/Time:  Thursday May 30 2017 14:38:25 EDT Ventricular Rate:  79 PR Interval:    QRS Duration: 88 QT Interval:  435 QTC Calculation: 499 R Axis:   70 Text Interpretation:  Sinus rhythm Consider left ventricular hypertrophy Borderline prolonged QT interval No significant change since last tracing Confirmed by Zenovia Jarred [phone]) on 05/30/2017 3:27:16 PM   Radiology Dg Chest Portable 1 View  Result Date: 05/30/2017 CLINICAL DATA:  Respiratory distress. Recent hospitalization for pneumonia. EXAM: PORTABLE CHEST 1 VIEW COMPARISON:  Two-view chest 04/29/2017. FINDINGS: Cardiomegaly. Faint RIGHT upper lobe opacity is improved, consistent with clearing pneumonia. Mild increased linear opacities at both lung bases, likely subsegmental atelectasis from low lung volumes. No effusion or pneumothorax. No edema or dense consolidation. IMPRESSION: Cardiomegaly with improved RIGHT upper lobe infiltrate. Low lung volumes with mild subsegmental atelectasis. No dense consolidation or edema. Electronically Signed   By: Staci Righter M.D.   On: 05/30/2017 15:02    Procedures Procedures  (including critical care time)  CRITICAL CARE Performed by: Gardiner Sleeper Total critical care time: 44minutes Critical care time was exclusive of separately billable procedures and treating other patients. Critical care was necessary to treat or prevent imminent or life-threatening deterioration. Critical care was time spent personally by me on the following activities: development of treatment plan with patient and/or surrogate as well as nursing, discussions with consultants, evaluation of patient's response to treatment, examination of patient, obtaining history from patient or surrogate, ordering and performing treatments and interventions, ordering and review of laboratory studies, ordering and review of radiographic studies, pulse oximetry and re-evaluation of patient's condition.   Medications Ordered in ED Medications  methylPREDNISolone sodium succinate (SOLU-MEDROL) 125 mg/2 mL injection 60 mg (has no administration in time range)     Initial Impression / Assessment and Plan / ED Course  I have reviewed the triage vital signs and the nursing notes.  Pertinent labs & imaging results that were available during my care of the patient were reviewed by me and considered in my medical decision making (see chart for details).      Patient is a 57 year old male end-stage renal disease on dialysis.  Last had dialysis yesterday but had less than usual.  He is here today with acute shortness of breath.  Patient is in acute respiratory failure on arrival.  Patient reports that he got normal dialysis yesterday but he says that he "did not get much taken off his usual".  Patient reports that he had a resolving pneumonia.  Wife reports that he had a little bit of blood in his urine.  Awaiting urine sample here.  Discussed with nephrology given hypertension, sounds of crackles, and necessity of BiPAP.  Patient's x-ray does not appear fluid overloaded, however given physical symptoms  discussed with nephrology.  They agree, they will, and dialyze. Final Clinical Impressions(s) / ED Diagnoses   Final diagnoses:  None    ED Discharge Orders    None       Macarthur Critchley, MD 05/30/17 1546

## 2017-05-30 NOTE — H&P (Signed)
History and Physical    Kirk [name] JKK:[id number] DOB: 1960-07-07 DOA: 05/30/2017  PCP: Gayland Curry, DO   Patient coming from: Home, by way of PCP clinic   Chief Complaint: Acute respiratory distress   HPI: Kirk [name] is a 57 y.o. male with medical history significant for end-stage renal disease on hemodialysis, chronic pancytopenia, hypertension, anxiety, OSA on CPAP, and a admission for pneumonia last month, now presenting to the emergency department with acute respiratory distress.  Patient reports improved respiratory status after treatment for pneumonia last month, but notes that he never did seem to return to baseline.  He underwent hemodialysis yesterday but reports that they did not remove as much fluid as usual.  He saw his PCP today for evaluation of these complaints, was noted to be in acute distress, and was sent to the ED for further evaluation.  Patient reports ongoing subjective fevers since treatment for pneumonia but has only scant sputum production.  Denies chest pain.  Denies lower extremity tenderness.  ED Course: Upon arrival to the ED, patient is found to be afebrile, saturating well on BiPAP, mildly tachypneic, and hypertensive.  EKG features sinus rhythm with LVH.  Chest x-ray is notable for cardiomegaly with improved right upper lobe infiltrate.  Chemistry panelfeatures a normal potassium and bicarbonate, BUN 30, and creatinine 11.37.  CBC features a stable chronic pancytopenia with WBC 2500, hemoglobin 9.3, and platelets 103,000.  Lactic acid and troponin are normal.  There was concern that patient's presentation may be secondary to hypervolemia and nephrology was consulted by the ED physician and arranged urgent dialysis.  Patient was taken from the ED for HD, 3.3 L were removed, but patient's respiratory status remains unchanged and he continues to require BiPAP.  He was sent back to the ED where CTA chest was ordered, negative for PE but demonstrative of  pneumonia in bilateral lower lobes.  Review of Systems:  All other systems reviewed and apart from HPI, are negative.  Past Medical History:  Diagnosis Date  . Acute edema of lung, unspecified   . Acute, but ill-defined, cerebrovascular disease   . Allergy   . Anemia   . 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)   . Anxiety   . Asthma   . Carpal tunnel syndrome   . Cellulitis and abscess of trunk   . Cholelithiasis 07/13/2014  . Chronic headaches   . Debility, unspecified   . Dermatophytosis of the body   . Dysrhythmia    history of  . Edema   . End stage renal disease on dialysis [facility])    "MWF; [facility] in [facility]" (10/21/2014)  . Essential hypertension, benign   . GERD (gastroesophageal reflux disease)   . Gout, unspecified   . HTN (hypertension)   . Hypertrophy of prostate without urinary obstruction and other lower urinary tract symptoms (LUTS)   . Hypotension, unspecified   . Impotence of organic origin   . Insomnia, unspecified   . Kidney replaced by transplant   . Localization-related (focal) (partial) epilepsy and epileptic syndromes with complex partial seizures, without mention of intractable epilepsy   . Lumbago   . Memory loss   . OSA on CPAP   . Other and unspecified hyperlipidemia   . Other chronic nonalcoholic liver disease   . Other malaise and fatigue   . Other nonspecific abnormal serum enzyme levels   . Pain in joint, lower leg   . Pain in joint, upper arm   . Pneumonia "  several times"  . Renal dialysis status(V45.11) 02/05/2010   restarted 01/02/13 ofter renal trransplant failure  . Secondary hyperparathyroidism (of renal origin)   . Shortness of breath   . Sleep apnea   . Tension headache   . Unspecified constipation   . Unspecified essential hypertension   . Unspecified hereditary and idiopathic peripheral neuropathy   . Unspecified vitamin D deficiency     Past Surgical History:  Procedure Laterality Date  . AV FISTULA PLACEMENT  Left ?2010   "forearm; at [facility]"  . BACK SURGERY    . CARDIAC CATHETERIZATION  03/21/2011  . CHOLECYSTECTOMY N/A 10/21/2014   Procedure: LAPAROSCOPIC CHOLECYSTECTOMY WITH INTRAOPERATIVE CHOLANGIOGRAM;  Surgeon: Autumn Messing III, MD;  Location: [facility];  Service: General;  Laterality: N/A;  . COLONOSCOPY    . 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 KIDNEY TRANSPLANT  08/17/2011   [place]  10/21/2014   w/IOC  . LEFT HEART CATHETERIZATION WITH CORONARY ANGIOGRAM N/A 03/21/2011   Procedure: LEFT HEART CATHETERIZATION WITH CORONARY ANGIOGRAM;  Surgeon: [name], MD;  Location: [facility] CATH LAB;  Service: Cardiovascular;  Laterality: N/A;  . NEPHRECTOMY  08/2013   removed transplaned kidney  . POSTERIOR FUSION CERVICAL SPINE  06/25/2012   for spinal stenosis  . VASECTOMY  2010     reports that he has been smoking e-cigarettes.  He has a 16.00 pack-year smoking history. He has never used smokeless tobacco. He reports that he does not drink alcohol or use drugs.  Allergies  Allergen Reactions  . Codeine Nausea And Vomiting and Nausea Only    Altered mental status    Family History  Adopted: Yes  Problem Relation Age of Onset  . Colon cancer Neg Hx   . Esophageal cancer Neg Hx   . Rectal cancer Neg Hx   . Stomach cancer Neg Hx      Prior to Admission medications   Medication Sig Start Date End Date Taking? Authorizing Provider  acetaminophen (TYLENOL) 500 MG tablet Take 1 tablet (500 mg total) by mouth every 6 (six) hours as needed for moderate pain. 09/12/14  Yes Samuella Cota, MD  aspirin EC 81 MG tablet Take 81 mg by mouth daily.   Yes [provider]  b complex-vitamin c-folic acid (NEPHRO-VITE) 0.8 MG TABS tablet TAKE 1 TABLET BY MOUTH EVERY DAY 12/03/16  Yes Reed, Tiffany L, DO  budesonide-formoterol (SYMBICORT) 80-4.5 MCG/ACT inhaler Inhale 2 puffs into the lungs 2 (two) times daily. 01/30/17  Yes  Shahmehdi, Valeria Batman, MD  calcitRIOL (ROCALTROL) 0.25 MCG capsule Take 1 capsule (0.25 mcg total) by mouth every Monday, Wednesday, and Friday with hemodialysis. 09/21/14  Yes Bonnielee Haff, MD  calcium acetate (PHOSLO) 667 MG capsule Take by mouth 3 (three) times daily with meals. Take 4 capsule with snacks and 8 capsules with meals.   Yes [provider]  Cholecalciferol (VITAMIN D3) 5000 UNITS TABS Take 1 tablet by mouth daily.   Yes [provider]  cinacalcet (SENSIPAR) 30 MG tablet Take 30 mg by mouth daily. With dinner.   Yes [provider]  clonazePAM (KLONOPIN) 0.5 MG tablet Take 1 tablet (0.5 mg total) by mouth 2 (two) times daily. 09/22/16  Yes Orvan Falconer, MD  diphenhydrAMINE (BENADRYL) 25 MG tablet Take 25 mg by mouth at bedtime as needed for allergies.   Yes [provider]  guaiFENesin (MUCINEX) 600 MG 12 hr tablet Take  600 mg by mouth 2 (two) times daily.   Yes [provider]  guaiFENesin-dextromethorphan (ROBITUSSIN DM) 100-10 MG/5ML syrup Take 10 mLs by mouth every 4 (four) hours as needed for cough.   Yes [provider]  hydrALAZINE (APRESOLINE) 50 MG tablet Take 1 tablet (50 mg total) by mouth every 8 (eight) hours. 09/22/16  Yes Orvan Falconer, MD  metoprolol tartrate (LOPRESSOR) 50 MG tablet Take 75 mg by mouth 2 (two) times daily.   Yes [provider]  omeprazole (PRILOSEC) 20 MG capsule TAKE 1 CAPSULE BY MOUTH 2 TIMES DAILY BEFORE A MEAL. 12/26/16  Yes Milus Banister, MD  ondansetron (ZOFRAN ODT) 4 MG disintegrating tablet Take 1 tablet (4 mg total) by mouth every 8 (eight) hours as needed for nausea or vomiting. 10/26/16  Yes Carmon Sails J, PA-C  pravastatin (PRAVACHOL) 40 MG tablet TAKE 1 TABLET EVERY EVENING 01/03/17  Yes Reed, Tiffany L, DO  Probiotic Product (PROBIOTIC-10 PO) Take 1 tablet by mouth daily.   Yes [provider]  traMADol (ULTRAM) 50 MG tablet Take 1 tablet (50 mg total) by mouth daily as  needed (severe headache). 10/08/16  Yes Gayland Curry, DO    Physical Exam: Vitals:   05/30/17 1923 05/30/17 1953 05/30/17 2030 05/30/17 2045  BP: (!) 164/85 (!) 175/81 (!) 154/76 (!) 148/65  Pulse: 74 79 82 80  Resp: (!) 25 (!) 26 (!) 25 (!) 25  Temp:      TempSrc: Oral     SpO2: 100% 100% 99% 100%  Weight: 95.6 kg (210 lb 12.2 oz)         Constitutional: Not in acute distress, appears anxious and uncomfortable  Eyes: PERTLA, lids and conjunctivae normal ENMT: Mucous membranes are moist. Posterior pharynx clear of any exudate or lesions.   Neck: normal, supple, no masses, no thyromegaly Respiratory: Slightly diminished breath sounds bilaterally with rhonchi bilaterally, right > left. Mild tachypnea, mild dyspnea with speech though completing full sentences. No accessory muscle use.  Cardiovascular: S1 & S2 heard, regular rate and rhythm. Trace pretibial edema bilaterally. Abdomen: No distension, no tenderness, soft. Bowel sounds normal.  Musculoskeletal: no clubbing / cyanosis. No joint deformity upper and lower extremities.    Skin: no significant rashes, lesions, ulcers. Warm, dry, well-perfused. Neurologic: CN 2-12 grossly intact. Sensation intact. Strength 5/5 in all 4 limbs.  Psychiatric: Alert and oriented x 3. Cooperative.    Labs on Admission: I have personally reviewed following labs and imaging studies  CBC: Recent Labs  Lab 05/30/17 1448  WBC 2.5*  NEUTROABS 1.4*  HGB 9.3*  HCT 28.7*  MCV 98.0  PLT 263*   Basic Metabolic Panel: Recent Labs  Lab 05/30/17 1448  NA 140  K 4.0  CL 99*  CO2 27  GLUCOSE 130*  BUN 30*  CREATININE 11.37*  CALCIUM 9.1   GFR: Estimated Creatinine Clearance: 8.1 mL/min (A) (by C-G formula based on SCr of 11.37 mg/dL (H)). Liver Function Tests: Recent Labs  Lab 05/30/17 1448  AST 21  ALT 22  ALKPHOS 55  BILITOT 0.8  PROT 6.7  ALBUMIN 3.5   No results for input(s): LIPASE, AMYLASE in the last 168 hours. No results  for input(s): AMMONIA in the last 168 hours. Coagulation Profile: No results for input(s): INR, PROTIME in the last 168 hours. Cardiac Enzymes: No results for input(s): CKTOTAL, CKMB, CKMBINDEX, TROPONINI in the last 168 hours. BNP (last 3 results) No results for input(s): PROBNP in the last  8760 hours. HbA1C: No results for input(s): HGBA1C in the last 72 hours. CBG: No results for input(s): GLUCAP in the last 168 hours. Lipid Profile: No results for input(s): CHOL, HDL, LDLCALC, TRIG, CHOLHDL, LDLDIRECT in the last 72 hours. Thyroid Function Tests: No results for input(s): TSH, T4TOTAL, FREET4, T3FREE, THYROIDAB in the last 72 hours. Anemia Panel: No results for input(s): VITAMINB12, FOLATE, FERRITIN, TIBC, IRON, RETICCTPCT in the last 72 hours. Urine analysis:    Component Value Date/Time   COLORURINE YELLOW 02/19/2017 1520   APPEARANCEUR HAZY (A) 02/19/2017 1520   LABSPEC 1.012 02/19/2017 1520   PHURINE 8.0 02/19/2017 1520   GLUCOSEU 50 (A) 02/19/2017 1520   HGBUR MODERATE (A) 02/19/2017 1520   BILIRUBINUR NEGATIVE 02/19/2017 1520   KETONESUR NEGATIVE 02/19/2017 1520   PROTEINUR >=300 (A) 02/19/2017 1520   UROBILINOGEN 0.2 04/06/2014 1030   NITRITE NEGATIVE 02/19/2017 1520   LEUKOCYTESUR MODERATE (A) 02/19/2017 1520   Sepsis Labs: '@LABRCNTIP'$ (procalcitonin:4,lacticidven:4) )No results found for this or any previous visit (from the past 240 hour(s)).   Radiological Exams on Admission: Dg Chest Portable 1 View  Result Date: 05/30/2017 CLINICAL DATA:  Respiratory distress. Recent hospitalization for pneumonia. EXAM: PORTABLE CHEST 1 VIEW COMPARISON:  Two-view chest 04/29/2017. FINDINGS: Cardiomegaly. Faint RIGHT upper lobe opacity is improved, consistent with clearing pneumonia. Mild increased linear opacities at both lung bases, likely subsegmental atelectasis from low lung volumes. No effusion or pneumothorax. No edema or dense consolidation. IMPRESSION: Cardiomegaly with  improved RIGHT upper lobe infiltrate. Low lung volumes with mild subsegmental atelectasis. No dense consolidation or edema. Electronically Signed   By: Staci Righter M.D.   On: 05/30/2017 15:02    EKG: Independently reviewed. Sinus rhythm, LVH.   Assessment/Plan   1. HCAP - Presents with acute respiratory distress requiring BiPAP in ED  - He was afebrile with chronic leukopenia and CXR demonstrating improved RUL infiltrate and no edema  - There was concern for hypervolemia causing sxs and urgent HD was arranged with 3.3 liters removed  - Continued to require BiPAP after HD and sent for CTA chest which is negative for PE but revealing of bilateral lower lobe pneumonia   - Culture blood and sputum, check procalcitonin, start vancomycin and cefepime, continue supportive care with prn supplemental O2    2. ESRD  - Had HD on 5/8, and again in hospital 5/9 given respiratory distress with concern for hypervolemia  - Appreciate nephrology arranging inpatient HD  - No acidosis, uremia, hyperkalemia, or apparent hypervolemia at time of admission   3. Pancytopenia  - Count appear stable, no bleeding  - Evaluated by oncology with bone marrow biopsy in 2016, no signs of MDS, was recommended to start erythropoietin supplement    4. Hypertension  - BP improved with HD, now at goal  - Continue hydralazine and Lopressor   5. OSA  - Continue CPAP qHS   6. Anxiety  - Continue Klonopin     DVT prophylaxis: sq heprain  Code Status: Full  Family Communication: Significant other updated at bedside Consults called: Nephrology Admission status: Inpatient    Vianne Bulls, MD [facility] Pager [phone]  If 7PM-7AM, please contact night-coverage [email or web address] Password [email or web address]  05/30/2017, 9:50 PM

## 2017-05-30 NOTE — Progress Notes (Signed)
RT NOTE:  Pt transported to CT and back without event.  

## 2017-05-30 NOTE — Procedures (Signed)
Patient was seen on dialysis and the procedure was supervised.  BFR 400  Via AVF BP is  182/88.   Patient appears to be tolerating treatment well.  Presented after getting his full HD treatment yesterday at [facility] but now is SOB and hypertensive.  He was recently admitted at [facility] for PNA-  Likely needed EDW down.  Right now one hour into an extra HD treatment trying to UF 5 liters and if improves symptomatically can be discharged and go to his OP unit tomorrow with a lower EDW.  He is currently c/o back pain but I think is musculoskeletal   Kirk [name] A 05/30/2017

## 2017-05-30 NOTE — ED Provider Notes (Signed)
I saw and evaluated the patient, reviewed the resident's note and I agree with the findings and plan.   EKG Interpretation  Date/Time:  Thursday May 30 2017 14:38:25 EDT Ventricular Rate:  79 PR Interval:    QRS Duration: 88 QT Interval:  435 QTC Calculation: 499 R Axis:   70 Text Interpretation:  Sinus rhythm Consider left ventricular hypertrophy Borderline prolonged QT interval No significant change since last tracing Confirmed by Zenovia Jarred [id number]) on 05/30/2017 3:27:84 PM     56 year old male here with persistent shortness of breath despite receiving hemodialysis today.  Has audible rhonchi on exam.  Will admit to hospitalist for further evaluation   Lacretia Leigh, MD 05/30/17 2117

## 2017-05-31 ENCOUNTER — Other Ambulatory Visit: Payer: Self-pay

## 2017-05-31 DIAGNOSIS — J181 Lobar pneumonia, unspecified organism: Principal | ICD-10-CM

## 2017-05-31 LAB — CBC WITH DIFFERENTIAL/PLATELET
BASOS ABS: 0 10*3/uL (ref 0.0–0.1)
BASOS PCT: 0 %
EOS ABS: 0 10*3/uL (ref 0.0–0.7)
Eosinophils Relative: 0 %
HCT: 28.7 % — ABNORMAL LOW (ref 39.0–52.0)
HEMOGLOBIN: 9.1 g/dL — AB (ref 13.0–17.0)
Lymphocytes Relative: 9 %
Lymphs Abs: 0.4 10*3/uL — ABNORMAL LOW (ref 0.7–4.0)
MCH: 31 pg (ref 26.0–34.0)
MCHC: 31.7 g/dL (ref 30.0–36.0)
MCV: 97.6 fL (ref 78.0–100.0)
Monocytes Absolute: 0.3 10*3/uL (ref 0.1–1.0)
Monocytes Relative: 7 %
NEUTROS ABS: 3.8 10*3/uL (ref 1.7–7.7)
NEUTROS PCT: 84 %
Platelets: 113 10*3/uL — ABNORMAL LOW (ref 150–400)
RBC: 2.94 MIL/uL — AB (ref 4.22–5.81)
RDW: 13.8 % (ref 11.5–15.5)
WBC: 4.6 10*3/uL (ref 4.0–10.5)

## 2017-05-31 LAB — PROCALCITONIN: Procalcitonin: 1.06 ng/mL

## 2017-05-31 LAB — RENAL FUNCTION PANEL
Albumin: 3.4 g/dL — ABNORMAL LOW (ref 3.5–5.0)
Anion gap: 13 (ref 5–15)
BUN: 25 mg/dL — ABNORMAL HIGH (ref 6–20)
CO2: 26 mmol/L (ref 22–32)
Calcium: 8.6 mg/dL — ABNORMAL LOW (ref 8.9–10.3)
Chloride: 99 mmol/L — ABNORMAL LOW (ref 101–111)
Creatinine, Ser: 8.51 mg/dL — ABNORMAL HIGH (ref 0.61–1.24)
GFR calc Af Amer: 7 mL/min — ABNORMAL LOW (ref >60)
GFR calc non Af Amer: 6 mL/min — ABNORMAL LOW (ref >60)
Glucose, Bld: 109 mg/dL — ABNORMAL HIGH (ref 65–99)
Phosphorus: 5.3 mg/dL — ABNORMAL HIGH (ref 2.5–4.6)
Potassium: 4.3 mmol/L (ref 3.5–5.1)
Sodium: 138 mmol/L (ref 135–145)

## 2017-05-31 LAB — STREP PNEUMONIAE URINARY ANTIGEN: Strep Pneumo Urinary Antigen: NEGATIVE

## 2017-05-31 MED ORDER — IPRATROPIUM-ALBUTEROL 0.5-2.5 (3) MG/3ML IN SOLN
3.0000 mL | Freq: Four times a day (QID) | RESPIRATORY_TRACT | Status: DC
  Administered 2017-05-31 – 2017-06-01 (×6): 3 mL via RESPIRATORY_TRACT
  Filled 2017-05-31 (×7): qty 3

## 2017-05-31 MED ORDER — HEPARIN SODIUM (PORCINE) 1000 UNIT/ML DIALYSIS
1000.0000 [IU] | INTRAMUSCULAR | Status: DC | PRN
  Filled 2017-05-31: qty 1

## 2017-05-31 MED ORDER — LIDOCAINE-PRILOCAINE 2.5-2.5 % EX CREA
1.0000 "application " | TOPICAL_CREAM | CUTANEOUS | Status: DC | PRN
  Filled 2017-05-31: qty 5

## 2017-05-31 MED ORDER — SODIUM CHLORIDE 0.9 % IV SOLN
2.0000 g | INTRAVENOUS | Status: DC
  Administered 2017-05-31 – 2017-06-04 (×4): 2 g via INTRAVENOUS
  Filled 2017-05-31 (×6): qty 20

## 2017-05-31 MED ORDER — PENTAFLUOROPROP-TETRAFLUOROETH EX AERO
1.0000 "application " | INHALATION_SPRAY | CUTANEOUS | Status: DC | PRN
  Filled 2017-05-31: qty 30

## 2017-05-31 MED ORDER — HEPARIN SODIUM (PORCINE) 1000 UNIT/ML DIALYSIS
3000.0000 [IU] | Freq: Once | INTRAMUSCULAR | Status: DC
  Filled 2017-05-31: qty 3

## 2017-05-31 MED ORDER — SODIUM CHLORIDE 0.9 % IV SOLN: 100.0000 mL | INTRAVENOUS | Status: DC | PRN

## 2017-05-31 MED ORDER — CALCITRIOL 0.25 MCG PO CAPS
1.2500 ug | ORAL_CAPSULE | ORAL | Status: DC
  Administered 2017-06-01 – 2017-06-05 (×5): 1.25 ug via ORAL

## 2017-05-31 MED ORDER — IPRATROPIUM-ALBUTEROL 0.5-2.5 (3) MG/3ML IN SOLN: 3.0000 mL | RESPIRATORY_TRACT | Status: DC | PRN

## 2017-05-31 MED ORDER — METHYLPREDNISOLONE SODIUM SUCC 40 MG IJ SOLR
40.0000 mg | Freq: Two times a day (BID) | INTRAMUSCULAR | Status: DC
  Administered 2017-05-31 – 2017-06-02 (×6): 40 mg via INTRAVENOUS
  Filled 2017-05-31 (×6): qty 1

## 2017-05-31 MED ORDER — CEFTRIAXONE SODIUM 1 G IJ SOLR: 1.0000 g | INTRAMUSCULAR | Status: DC

## 2017-05-31 MED ORDER — DARBEPOETIN ALFA 100 MCG/0.5ML IJ SOSY
100.0000 ug | PREFILLED_SYRINGE | INTRAMUSCULAR | Status: DC
  Filled 2017-05-31: qty 0.5

## 2017-05-31 MED ORDER — CEFTRIAXONE SODIUM 1 G IJ SOLR
1.0000 g | INTRAMUSCULAR | Status: DC
  Filled 2017-05-31: qty 10

## 2017-05-31 MED ORDER — NICOTINE 14 MG/24HR TD PT24
14.0000 mg | MEDICATED_PATCH | Freq: Every day | TRANSDERMAL | Status: DC
  Administered 2017-05-31 – 2017-06-05 (×6): 14 mg via TRANSDERMAL
  Filled 2017-05-31 (×6): qty 1

## 2017-05-31 MED ORDER — AZITHROMYCIN 500 MG PO TABS
500.0000 mg | ORAL_TABLET | Freq: Every day | ORAL | Status: DC
  Administered 2017-05-31 – 2017-06-05 (×6): 500 mg via ORAL
  Filled 2017-05-31 (×8): qty 1

## 2017-05-31 MED ORDER — HYDRALAZINE HCL 20 MG/ML IJ SOLN
10.0000 mg | INTRAMUSCULAR | Status: DC | PRN
  Administered 2017-05-31 – 2017-06-05 (×2): 10 mg via INTRAVENOUS
  Filled 2017-05-31 (×2): qty 1

## 2017-05-31 MED ORDER — ALTEPLASE 2 MG IJ SOLR: 2.0000 mg | Freq: Once | INTRAMUSCULAR | Status: DC | PRN

## 2017-05-31 MED ORDER — LIDOCAINE HCL (PF) 1 % IJ SOLN: 5.0000 mL | INTRAMUSCULAR | Status: DC | PRN

## 2017-05-31 NOTE — Progress Notes (Signed)
RT NOTE:  Pt transported to 4E12 without event. Report given to [place], RRT.

## 2017-05-31 NOTE — Progress Notes (Signed)
PROGRESS NOTE    ASCENSION STFLEUR  URK:[id number] DOB: 02/18/1960 DOA: 05/30/2017 PCP: Gayland Curry, DO    Brief Narrative:  57 year old male who presented with acute respiratory distress.  He does have the significant past medical history of end-stage renal disease on hemodialysis, chronic pancytopenia, hypertension, anxiety and obstructive sleep apnea.  Recent hospitalization for pneumonia about a month ago.  After his hospitalization in terms of his dyspnea he never did return to his baseline.  On the day of admission was noted in severe respiratory distress by his primary care physician.  On the initial physical examination he was noted in respiratory distress, he was placed on noninvasive mechanical ventilation, his blood pressure was 164/85, heart rate 74, respiratory rate 26 and oxygen saturation 100% on BiPAP.  Patient was in distress, he had decreased breath sounds bilaterally with rhonchi, heart S1-S2 present, rhythmic, abdomen soft nontender, trace lower extremity edema bilaterally.  Sodium 140, potassium 4.0, chloride 99, bicarb 27, glucose 130, BUN 30, creatinine 11.3, white count 2.5, hemoglobin 9.3, hematocrit 28.7, platelets 103.  His venous pH was 7.48.  Chest x-ray with mild cardiomegaly, increased lung markings bilaterally, bibasilar atelectasis.  CT chest negative for pulmonary embolism, bibasilar infiltrates.  EKG sinus rhythm, normal axis, normal intervals, LVH with repolarization changes.   Patient was admitted to the hospital with a working diagnosis of acute hypoxic respiratory failure due to bilateral lower lobe pneumonia.   Assessment & Plan:   Principal Problem:   HCAP (healthcare-associated pneumonia) Active Problems:   OSA on CPAP   HTN (hypertension)   Pancytopenia (HCC)   Obesity hypoventilation syndrome (HCC)   Acute respiratory distress   ESRD (end stage renal disease) (HCC)   Anxiety   1.  Acute hypoxic respiratory failure due to bilateral lower lobes  pneumonia complicated by COPD exacerbation. Will continue antibiotic therapy with ceftriaxone and azithromycin, follow on cultures, cell count and temperature curve. Likely patient does have COPD, will continue aggressive bronchodilator therapy and oxymetry monitoring, Supplemental 02 per [place] to target 02 saturation greater than 92%. Systemic steroids with methylprednisolone 40 mg IV bid. Patient will need outpatient full PFT, advices about smoking cessation, continue nicotine patch. Continue dulera.   2.  End-stage renal disease on hemodialysis. Will continue renal replacement therapy per nephrology recommendations, currently no signs of hypervolemia or uremia. Continue calcitriol and phoslo and sensipar.   3.  Pancytopenia. Chronic, continue to monitor cell count.   4.  Obstructive sleep apnea. C pap at night per home regimen.   5.  Anxiety. Continue clonazepam.   6. Dyslipidemia. Continue pravastatin.   7. HTN. Continue metoprolol 75 mg po bid and hydralazine 50 mg tid.   DVT prophylaxis: heparin   Code Status:  full Family Communication: no family at the bedside Disposition Plan:  Home when stable    Consultants:    Nephrology   Procedures:     Antimicrobials:   Azithromycin   Ceftriaxone.     Subjective: Patient with improved dyspnea but not back to baseline. Moderate in intensity, associated with cough and wheezing, improved with nebulized medications, worse with cold air. Patient had intermittent episodes of cough and wheezing at home, several hospitalizations last year. Cold air triggers respiratory symptoms. Continue to smoke, in the past up to half a pack per day. In the past he was told to have COPD.   Objective: Vitals:   05/31/17 0030 05/31/17 0100 05/31/17 0313 05/31/17 0400  BP: (!) 164/77 (!) 160/81  Marland Kitchen)  184/77  Pulse: 79 81 80 78  Resp: (!) 24 (!) 24 (!) 25 19  Temp:  98.7 F (37.1 C)    TempSrc:  Oral    SpO2: 98% 98% 99% 99%  Weight:  95.7 kg (210 lb  15.7 oz)    Height:  5\' 7"  (1.702 m)      Intake/Output Summary (Last 24 hours) at 05/31/2017 0759 Last data filed at 05/31/2017 0111 Gross per 24 hour  Intake 600 ml  Output 3386 ml  Net -2786 ml   Filed Weights   05/30/17 1923 05/31/17 0100  Weight: 95.6 kg (210 lb 12.2 oz) 95.7 kg (210 lb 15.7 oz)    Examination:   General: deconditioned  Neurology: Awake and alert, non focal  E ENT: mid pallor, no icterus, oral mucosa moist Cardiovascular: No JVD. S1-S2 present, rhythmic, no gallops, rubs, or murmurs. No lower extremity edema. Pulmonary: decreased breath sounds bilaterally, decreased air movement, positive expiratory wheezing, diffuse bilateral rhonchi and rales. Gastrointestinal. Abdomen protuberant with no organomegaly, non tender, no rebound or guarding Skin. No rashes Musculoskeletal: no joint deformities     Data Reviewed: I have personally reviewed following labs and imaging studies  CBC: Recent Labs  Lab 05/30/17 1448 05/31/17 0313  WBC 2.5* 4.6  NEUTROABS 1.4* 3.8  HGB 9.3* 9.1*  HCT 28.7* 28.7*  MCV 98.0 97.6  PLT 103* 810*   Basic Metabolic Panel: Recent Labs  Lab 05/30/17 1448 05/31/17 0313  NA 140 138  K 4.0 4.3  CL 99* 99*  CO2 27 26  GLUCOSE 130* 109*  BUN 30* 25*  CREATININE 11.37* 8.51*  CALCIUM 9.1 8.6*  PHOS  --  5.3*   GFR: Estimated Creatinine Clearance: 10.8 mL/min (A) (by C-G formula based on SCr of 8.51 mg/dL (H)). Liver Function Tests: Recent Labs  Lab 05/30/17 1448 05/31/17 0313  AST 21  --   ALT 22  --   ALKPHOS 55  --   BILITOT 0.8  --   PROT 6.7  --   ALBUMIN 3.5 3.4*   No results for input(s): LIPASE, AMYLASE in the last 168 hours. No results for input(s): AMMONIA in the last 168 hours. Coagulation Profile: No results for input(s): INR, PROTIME in the last 168 hours. Cardiac Enzymes: No results for input(s): CKTOTAL, CKMB, CKMBINDEX, TROPONINI in the last 168 hours. BNP (last 3 results) No results for  input(s): PROBNP in the last 8760 hours. HbA1C: No results for input(s): HGBA1C in the last 72 hours. CBG: No results for input(s): GLUCAP in the last 168 hours. Lipid Profile: No results for input(s): CHOL, HDL, LDLCALC, TRIG, CHOLHDL, LDLDIRECT in the last 72 hours. Thyroid Function Tests: No results for input(s): TSH, T4TOTAL, FREET4, T3FREE, THYROIDAB in the last 72 hours. Anemia Panel: No results for input(s): VITAMINB12, FOLATE, FERRITIN, TIBC, IRON, RETICCTPCT in the last 72 hours.    Radiology Studies: I have reviewed all of the imaging during this hospital visit personally     Scheduled Meds: . aspirin EC  81 mg Oral Daily  . calcitRIOL  0.25 mcg Oral Q M,W,F-HD  . calcium acetate  5,336 mg Oral TID WC  . cholecalciferol  5,000 Units Oral Daily  . cinacalcet  30 mg Oral Q supper  . clonazePAM  0.5 mg Oral BID  . heparin  3,000 Units Dialysis Once in dialysis  . heparin  5,000 Units Subcutaneous Q8H  . hydrALAZINE  50 mg Oral Q8H  . metoprolol tartrate  75  mg Oral BID  . mometasone-formoterol  2 puff Inhalation BID  . pantoprazole  40 mg Oral BID AC  . pravastatin  40 mg Oral q1800  . sodium chloride flush  3 mL Intravenous Q12H  . sodium chloride flush  3 mL Intravenous Q12H   Continuous Infusions: . sodium chloride    . sodium chloride    . sodium chloride       LOS: 1 day        Tawni Millers, MD [facility] Pager [phone]

## 2017-05-31 NOTE — Consult Note (Addendum)
[facility] Renal Consultation Note  Indication for Consultation:  Management of ESRD/hemodialysis; anemia, hypertension/volume and secondary hyperparathyroidism  HPI: Kirk [name] is a 57 y.o. male  with ESRD  HD MWF at [facility]. 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 IN ER was mildly tachypneic, hypertensive,CXR  notable for cardiomegaly with improved right upper lobe infiltrate, presentation concerned for  Volume overload and had HD yesterday 3.3l uf  But resp rate continued  tachy unchanged  Requiring BiPAP=. CTA  Chest ordered showing Neg for PE  but positive for  bilat lower lobe PNA . He notes he smokes a pack cigarettes  a day and has a nebulizer at home.     Currently in room with  dry cough, no sob."Feel improved since Hd and antibiotics , nebulize txs ."       Past Medical History:  Diagnosis Date  . Acute edema of lung, unspecified   . Acute, but ill-defined, cerebrovascular disease   . Allergy   . Anemia   . 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)   . Anxiety   . Asthma   . Carpal tunnel syndrome   . Cellulitis and abscess of trunk   . Cholelithiasis 07/13/2014  . Chronic headaches   . Debility, unspecified   . Dermatophytosis of the body   . Dysrhythmia    history of  . Edema   . End stage renal disease on dialysis [facility])    "MWF; [facility]" (10/21/2014)  . Essential hypertension, benign   . GERD (gastroesophageal reflux disease)   . Gout, unspecified   . HTN (hypertension)   . Hypertrophy of prostate without urinary obstruction and other lower urinary tract symptoms (LUTS)   .  Hypotension, unspecified   . Impotence of organic origin   . Insomnia, unspecified   . Kidney replaced by transplant   . Localization-related (focal) (partial) epilepsy and epileptic syndromes with complex partial seizures, without mention of intractable epilepsy   . Lumbago   . Memory loss   . OSA on CPAP   . Other and unspecified hyperlipidemia   . Other chronic nonalcoholic liver disease   . Other malaise and fatigue   . Other nonspecific abnormal serum enzyme levels   . Pain in joint, lower leg   . Pain in joint, upper arm   . Pneumonia "several times"  . Renal dialysis status(V45.11) 02/05/2010   restarted 01/02/13 ofter renal trransplant failure  . Secondary hyperparathyroidism (of renal origin)   . Shortness of breath   . Sleep apnea   . Tension headache   . Unspecified constipation   . Unspecified essential hypertension   . Unspecified hereditary and idiopathic peripheral neuropathy   . Unspecified vitamin D deficiency     Past Surgical History:  Procedure Laterality Date  . AV FISTULA PLACEMENT Left ?2010   "forearm; at [facility]"  . BACK SURGERY    . CARDIAC CATHETERIZATION  03/21/2011  . CHOLECYSTECTOMY N/A 10/21/2014   Procedure: LAPAROSCOPIC CHOLECYSTECTOMY WITH INTRAOPERATIVE CHOLANGIOGRAM;  Surgeon: Autumn Messing III, MD;  Location: [facility];  Service: General;  Laterality: N/A;  .  COLONOSCOPY    . 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 KIDNEY TRANSPLANT  08/17/2011   [place]  10/21/2014   w/IOC  . LEFT HEART CATHETERIZATION WITH CORONARY ANGIOGRAM N/A 03/21/2011   Procedure: LEFT HEART CATHETERIZATION WITH CORONARY ANGIOGRAM;  Surgeon: [name], MD;  Location: [facility] CATH LAB;  Service: Cardiovascular;  Laterality: N/A;  . NEPHRECTOMY  08/2013   removed transplaned kidney  . POSTERIOR FUSION CERVICAL SPINE  06/25/2012   for spinal stenosis  . VASECTOMY  2010      Family History  Adopted: Yes   Problem Relation Age of Onset  . Colon cancer Neg Hx   . Esophageal cancer Neg Hx   . Rectal cancer Neg Hx   . Stomach cancer Neg Hx       reports that he has been smoking e-cigarettes.  He has a 16.00 pack-year smoking history. He has never used smokeless tobacco. He reports that he does not drink alcohol or use drugs.   Allergies  Allergen Reactions  . Codeine Nausea And Vomiting and Nausea Only    Altered mental status    Prior to Admission medications   Medication Sig Start Date End Date Taking? Authorizing Provider  acetaminophen (TYLENOL) 500 MG tablet Take 1 tablet (500 mg total) by mouth every 6 (six) hours as needed for moderate pain. 09/12/14  Yes Samuella Cota, MD  aspirin EC 81 MG tablet Take 81 mg by mouth daily.   Yes [provider]  b complex-vitamin c-folic acid (NEPHRO-VITE) 0.8 MG TABS tablet TAKE 1 TABLET BY MOUTH EVERY DAY 12/03/16  Yes Reed, Tiffany L, DO  budesonide-formoterol (SYMBICORT) 80-4.5 MCG/ACT inhaler Inhale 2 puffs into the lungs 2 (two) times daily. 01/30/17  Yes Shahmehdi, Valeria Batman, MD  calcitRIOL (ROCALTROL) 0.25 MCG capsule Take 1 capsule (0.25 mcg total) by mouth every Monday, Wednesday, and Friday with hemodialysis. 09/21/14  Yes Bonnielee Haff, MD  calcium acetate (PHOSLO) 667 MG capsule Take by mouth 3 (three) times daily with meals. Take 4 capsule with snacks and 8 capsules with meals.   Yes [provider]  Cholecalciferol (VITAMIN D3) 5000 UNITS TABS Take 1 tablet by mouth daily.   Yes [provider]  cinacalcet (SENSIPAR) 30 MG tablet Take 30 mg by mouth daily. With dinner.   Yes [provider]  clonazePAM (KLONOPIN) 0.5 MG tablet Take 1 tablet (0.5 mg total) by mouth 2 (two) times daily. 09/22/16  Yes Orvan Falconer, MD  diphenhydrAMINE (BENADRYL) 25 MG tablet Take 25 mg by mouth at bedtime as needed for allergies.   Yes [provider]  guaiFENesin (MUCINEX) 600 MG 12 hr tablet Take 600 mg by mouth 2  (two) times daily.   Yes [provider]  guaiFENesin-dextromethorphan (ROBITUSSIN DM) 100-10 MG/5ML syrup Take 10 mLs by mouth every 4 (four) hours as needed for cough.   Yes [provider]  hydrALAZINE (APRESOLINE) 50 MG tablet Take 1 tablet (50 mg total) by mouth every 8 (eight) hours. 09/22/16  Yes Orvan Falconer, MD  metoprolol tartrate (LOPRESSOR) 50 MG tablet Take 75 mg by mouth 2 (two) times daily.   Yes [provider]  omeprazole (PRILOSEC) 20 MG capsule TAKE 1 CAPSULE BY MOUTH 2 TIMES DAILY BEFORE A MEAL. 12/26/16  Yes Milus Banister, MD  ondansetron (ZOFRAN ODT) 4 MG disintegrating tablet Take 1 tablet (4 mg total) by mouth every 8 (eight) hours as  needed for nausea or vomiting. 10/26/16  Yes Carmon Sails J, PA-C  pravastatin (PRAVACHOL) 40 MG tablet TAKE 1 TABLET EVERY EVENING 01/03/17  Yes Reed, Tiffany L, DO  Probiotic Product (PROBIOTIC-10 PO) Take 1 tablet by mouth daily.   Yes [provider]  traMADol (ULTRAM) 50 MG tablet Take 1 tablet (50 mg total) by mouth daily as needed (severe headache). 10/08/16  Yes Reed, Tiffany L, DO    TKW:IOXBDZ chloride, sodium chloride, sodium chloride, acetaminophen **OR** acetaminophen, alteplase, bisacodyl, calcium acetate, diphenhydrAMINE, guaiFENesin-dextromethorphan, heparin, hydrALAZINE, HYDROcodone-acetaminophen, ipratropium-albuterol, lidocaine (PF), lidocaine-prilocaine, ondansetron **OR** ondansetron (ZOFRAN) IV, pentafluoroprop-tetrafluoroeth, senna-docusate, sodium chloride flush  Results for orders placed or performed during the hospital encounter of 05/30/17 (from the past 48 hour(s))  CBC with Differential/Platelet     Status: Abnormal   Collection Time: 05/30/17  2:48 PM  Result Value Ref Range   WBC 2.5 (L) 4.0 - 10.5 K/uL   RBC 2.93 (L) 4.22 - 5.81 MIL/uL   Hemoglobin 9.3 (L) 13.0 - 17.0 g/dL   HCT 28.7 (L) 39.0 - 52.0 %   MCV 98.0 78.0 - 100.0 fL   MCH 31.7 26.0 - 34.0 pg   MCHC 32.4 30.0 -  36.0 g/dL   RDW 14.0 11.5 - 15.5 %   Platelets 103 (L) 150 - 400 K/uL    Comment: PLATELET COUNT CONFIRMED BY SMEAR   Neutrophils Relative % 54 %   Neutro Abs 1.4 (L) 1.7 - 7.7 K/uL   Lymphocytes Relative 18 %   Lymphs Abs 0.5 (L) 0.7 - 4.0 K/uL   Monocytes Relative 25 %   Monocytes Absolute 0.6 0.1 - 1.0 K/uL   Eosinophils Relative 2 %   Eosinophils Absolute 0.0 0.0 - 0.7 K/uL   Basophils Relative 1 %   Basophils Absolute 0.0 0.0 - 0.1 K/uL    Comment: Performed at [facility], [address] [place], [place] [address]  Comprehensive metabolic panel     Status: Abnormal   Collection Time: 05/30/17  2:48 PM  Result Value Ref Range   Sodium 140 135 - 145 mmol/L   Potassium 4.0 3.5 - 5.1 mmol/L   Chloride 99 (L) 101 - 111 mmol/L   CO2 27 22 - 32 mmol/L   Glucose, Bld 130 (H) 65 - 99 mg/dL   BUN 30 (H) 6 - 20 mg/dL   Creatinine, Ser 11.37 (H) 0.61 - 1.24 mg/dL   Calcium 9.1 8.9 - 10.3 mg/dL   Total Protein 6.7 6.5 - 8.1 g/dL   Albumin 3.5 3.5 - 5.0 g/dL   AST 21 15 - 41 U/L   ALT 22 17 - 63 U/L   Alkaline Phosphatase 55 38 - 126 U/L   Total Bilirubin 0.8 0.3 - 1.2 mg/dL   GFR calc non Af Amer 4 (L) >60 mL/min   GFR calc Af Amer 5 (L) >60 mL/min    Comment: (NOTE) The eGFR has been calculated using the CKD EPI equation. This calculation has not been validated in all clinical situations. eGFR's persistently <60 mL/min signify possible Chronic Kidney Disease.    Anion gap 14 5 - 15    Comment: Performed at [facility] [address] [place], [place] [address]  I-stat troponin, ED     Status: None   Collection Time: 05/30/17  3:05 PM  Result Value Ref Range   Troponin i, poc 0.03 0.00 - 0.08 ng/mL   Comment 3  Comment: Due to the release kinetics of cTnI, a negative result within the first hours of the onset of symptoms does not rule out myocardial infarction with certainty. If myocardial infarction is still suspected, repeat the test at  appropriate intervals.   I-Stat venous blood gas, ED     Status: Abnormal   Collection Time: 05/30/17  3:06 PM  Result Value Ref Range   pH, Ven 7.483 (H) 7.250 - 7.430   pCO2, Ven 36.3 (L) 44.0 - 60.0 mmHg   pO2, Ven 101.0 (H) 32.0 - 45.0 mmHg   Bicarbonate 27.2 20.0 - 28.0 mmol/L   TCO2 28 22 - 32 mmol/L   O2 Saturation 98.0 %   Acid-Base Excess 4.0 (H) 0.0 - 2.0 mmol/L   Patient temperature HIDE    Sample type VENOUS   I-Stat CG4 Lactic Acid, ED     Status: None   Collection Time: 05/30/17  3:07 PM  Result Value Ref Range   Lactic Acid, Venous 1.43 0.5 - 1.9 mmol/L  Procalcitonin - Baseline     Status: None   Collection Time: 05/30/17 10:43 PM  Result Value Ref Range   Procalcitonin 0.78 ng/mL    Comment:        Interpretation: PCT > 0.5 ng/mL and <= 2 ng/mL: Systemic infection (sepsis) is possible, but other conditions are known to elevate PCT as well. (NOTE)       Sepsis PCT Algorithm           Lower Respiratory Tract                                      Infection PCT Algorithm    ----------------------------     ----------------------------         PCT < 0.25 ng/mL                PCT < 0.10 ng/mL         Strongly encourage             Strongly discourage   discontinuation of antibiotics    initiation of antibiotics    ----------------------------     -----------------------------       PCT 0.25 - 0.50 ng/mL            PCT 0.10 - 0.25 ng/mL               OR       >80% decrease in PCT            Discourage initiation of                                            antibiotics      Encourage discontinuation           of antibiotics    ----------------------------     -----------------------------         PCT >= 0.50 ng/mL              PCT 0.26 - 0.50 ng/mL                AND       <80% decrease in PCT             Encourage initiation of  antibiotics       Encourage continuation           of antibiotics     ----------------------------     -----------------------------        PCT >= 0.50 ng/mL                  PCT > 0.50 ng/mL               AND         increase in PCT                  Strongly encourage                                      initiation of antibiotics    Strongly encourage escalation           of antibiotics                                     -----------------------------                                           PCT <= 0.25 ng/mL                                                 OR                                        > 80% decrease in PCT                                     Discontinue / Do not initiate                                             antibiotics Performed at [facility], [address] [place], [place] [address]   CBC WITH DIFFERENTIAL     Status: Abnormal   Collection Time: 05/31/17  3:13 AM  Result Value Ref Range   WBC 4.6 4.0 - 10.5 K/uL   RBC 2.94 (L) 4.22 - 5.81 MIL/uL   Hemoglobin 9.1 (L) 13.0 - 17.0 g/dL   HCT 28.7 (L) 39.0 - 52.0 %   MCV 97.6 78.0 - 100.0 fL   MCH 31.0 26.0 - 34.0 pg   MCHC 31.7 30.0 - 36.0 g/dL   RDW 13.8 11.5 - 15.5 %   Platelets 113 (L) 150 - 400 K/uL    Comment: CONSISTENT WITH PREVIOUS RESULT   Neutrophils Relative % 84 %   Neutro Abs 3.8 1.7 - 7.7 K/uL   Lymphocytes Relative 9 %   Lymphs Abs 0.4 (L) 0.7 - 4.0 K/uL   Monocytes Relative 7 %   Monocytes Absolute 0.3 0.1 - 1.0 K/uL   Eosinophils Relative  0 %   Eosinophils Absolute 0.0 0.0 - 0.7 K/uL   Basophils Relative 0 %   Basophils Absolute 0.0 0.0 - 0.1 K/uL    Comment: Performed at [facility] [address] [place], [place] [address]  Procalcitonin     Status: None   Collection Time: 05/31/17  3:13 AM  Result Value Ref Range   Procalcitonin 1.06 ng/mL    Comment:        Interpretation: PCT > 0.5 ng/mL and <= 2 ng/mL: Systemic infection (sepsis) is possible, but other conditions are known to elevate PCT as well. (NOTE)       Sepsis PCT  Algorithm           Lower Respiratory Tract                                      Infection PCT Algorithm    ----------------------------     ----------------------------         PCT < 0.25 ng/mL                PCT < 0.10 ng/mL         Strongly encourage             Strongly discourage   discontinuation of antibiotics    initiation of antibiotics    ----------------------------     -----------------------------       PCT 0.25 - 0.50 ng/mL            PCT 0.10 - 0.25 ng/mL               OR       >80% decrease in PCT            Discourage initiation of                                            antibiotics      Encourage discontinuation           of antibiotics    ----------------------------     -----------------------------         PCT >= 0.50 ng/mL              PCT 0.26 - 0.50 ng/mL                AND       <80% decrease in PCT             Encourage initiation of                                             antibiotics       Encourage continuation           of antibiotics    ----------------------------     -----------------------------        PCT >= 0.50 ng/mL                  PCT > 0.50 ng/mL               AND         increase in PCT  Strongly encourage                                      initiation of antibiotics    Strongly encourage escalation           of antibiotics                                     -----------------------------                                           PCT <= 0.25 ng/mL                                                 OR                                        > 80% decrease in PCT                                     Discontinue / Do not initiate                                             antibiotics Performed at [facility], [place] [address], [place], [place] [address]   Renal function panel     Status: Abnormal   Collection Time: 05/31/17  3:13 AM  Result Value Ref Range   Sodium 138 135 - 145 mmol/L   Potassium 4.3 3.5 - 5.1  mmol/L   Chloride 99 (L) 101 - 111 mmol/L   CO2 26 22 - 32 mmol/L   Glucose, Bld 109 (H) 65 - 99 mg/dL   BUN 25 (H) 6 - 20 mg/dL   Creatinine, Ser 8.51 (H) 0.61 - 1.24 mg/dL   Calcium 8.6 (L) 8.9 - 10.3 mg/dL   Phosphorus 5.3 (H) 2.5 - 4.6 mg/dL   Albumin 3.4 (L) 3.5 - 5.0 g/dL   GFR calc non Af Amer 6 (L) >60 mL/min   GFR calc Af Amer 7 (L) >60 mL/min    Comment: (NOTE) The eGFR has been calculated using the CKD EPI equation. This calculation has not been validated in all clinical situations. eGFR's persistently <60 mL/min signify possible Chronic Kidney Disease.    Anion gap 13 5 - 15    Comment: Performed at [facility] [address], [place], [place] [address]  .  ROS: see hpi for positives   Physical Exam: Vitals:   05/31/17 0400 05/31/17 0838  BP: (!) 184/77   Pulse: 78   Resp: 19   Temp:    SpO2: 99% 95%     General: alert  NAD male chronically ill appearing , wn ,wd/ OX4,  HEENT: [place] , MMdry , EOMI , nonicteric   Neck: no jvd,  suple Heart: RRR no m,r,g Lungs: Bilat Rhonchi, non labored breathing  Abdomen: obes,  Soft, NT, ND Extremities: bilat trace pedal edema  Skin: warm dry , no overt rash Neuro:  Alert OX4,no acute focal deficits noted  Dialysis Access: post bruit  LFA AVG   Dialysis Orders: Center: [facility]   on MWF . EDW 98 kgHD Bath 2k, 2ca  Time 4hr 72mn Heparin 4000. Access LFA AVG Calcitriol 1.5 mcg po / hd   Mircerfa 100 mcg (last given 75 mcg 05/22/17)   Assessment/Plan 1. Bilat Lobe PNA /COPD exacerbation= per admit team  2. ESRD -  HD MWF , had hd yest 9th  on wed 8th at outpt unit , k 4.3 next HD in am then back on MWF on Monday  3. Hypertension/volume  - 3l uf yest and below edw 95.7 ((35old edw)  Lower edw at dc , attempt 3 liter uf in am hD / op meds lopressor, hydralazine  4. Anemia  - hgb 9.1  esa on hd  Next on Monday  5/13=100 mcg  aranesp if here  5. Metabolic bone disease -  Po vit d on hd , binders with food  6. COPD/  Tobacco abuse = had discussion at contiued dangers , he plans to dc with PC giving patch  7. Nutrition -renal diet renal vit. 8. OSA - cpap   DErnest Haber PA-C [facility] [phone]/10/2017, 9:22 AM    I have seen and examined this patient and agree with plan and assessment in the above note with renal recommendations/intervention highlighted.  Pt feels better.  Has had 2 consecutive days of HD.  Will plan for HD tomorrow and then get back on schedule Monday.   JGovernor RooksColadonato,MD 05/31/2017 3:39 PM

## 2017-06-01 LAB — CBC WITH DIFFERENTIAL/PLATELET
Basophils Absolute: 0 10*3/uL (ref 0.0–0.1)
Basophils Relative: 0 %
EOS PCT: 0 %
Eosinophils Absolute: 0 10*3/uL (ref 0.0–0.7)
HCT: 27.7 % — ABNORMAL LOW (ref 39.0–52.0)
Hemoglobin: 9 g/dL — ABNORMAL LOW (ref 13.0–17.0)
LYMPHS ABS: 0.4 10*3/uL — AB (ref 0.7–4.0)
LYMPHS PCT: 11 %
MCH: 31.3 pg (ref 26.0–34.0)
MCHC: 32.5 g/dL (ref 30.0–36.0)
MCV: 96.2 fL (ref 78.0–100.0)
MONO ABS: 0.1 10*3/uL (ref 0.1–1.0)
MONOS PCT: 1 %
Neutro Abs: 3.7 10*3/uL (ref 1.7–7.7)
Neutrophils Relative %: 88 %
Platelets: 106 10*3/uL — ABNORMAL LOW (ref 150–400)
RBC: 2.88 MIL/uL — ABNORMAL LOW (ref 4.22–5.81)
RDW: 13.6 % (ref 11.5–15.5)
WBC: 4.2 10*3/uL (ref 4.0–10.5)

## 2017-06-01 LAB — BLOOD CULTURE ID PANEL (REFLEXED)
ACINETOBACTER BAUMANNII: NOT DETECTED
CANDIDA ALBICANS: NOT DETECTED
CANDIDA GLABRATA: NOT DETECTED
CANDIDA TROPICALIS: NOT DETECTED
Candida krusei: NOT DETECTED
Candida parapsilosis: NOT DETECTED
ENTEROBACTER CLOACAE COMPLEX: NOT DETECTED
ENTEROBACTERIACEAE SPECIES: NOT DETECTED
ESCHERICHIA COLI: NOT DETECTED
Enterococcus species: NOT DETECTED
Haemophilus influenzae: NOT DETECTED
Klebsiella oxytoca: NOT DETECTED
Klebsiella pneumoniae: NOT DETECTED
Listeria monocytogenes: NOT DETECTED
NEISSERIA MENINGITIDIS: NOT DETECTED
Proteus species: NOT DETECTED
Pseudomonas aeruginosa: NOT DETECTED
STREPTOCOCCUS AGALACTIAE: NOT DETECTED
STREPTOCOCCUS PNEUMONIAE: NOT DETECTED
Serratia marcescens: NOT DETECTED
Staphylococcus aureus (BCID): NOT DETECTED
Staphylococcus species: NOT DETECTED
Streptococcus pyogenes: NOT DETECTED
Streptococcus species: NOT DETECTED

## 2017-06-01 LAB — BASIC METABOLIC PANEL
Anion gap: 15 (ref 5–15)
BUN: 54 mg/dL — AB (ref 6–20)
CHLORIDE: 97 mmol/L — AB (ref 101–111)
CO2: 24 mmol/L (ref 22–32)
CREATININE: 12.06 mg/dL — AB (ref 0.61–1.24)
Calcium: 9.2 mg/dL (ref 8.9–10.3)
GFR calc Af Amer: 5 mL/min — ABNORMAL LOW (ref >60)
GFR calc non Af Amer: 4 mL/min — ABNORMAL LOW (ref >60)
GLUCOSE: 220 mg/dL — AB (ref 65–99)
Potassium: 4.7 mmol/L (ref 3.5–5.1)
Sodium: 136 mmol/L (ref 135–145)

## 2017-06-01 LAB — EXPECTORATED SPUTUM ASSESSMENT W GRAM STAIN, RFLX TO RESP C

## 2017-06-01 LAB — EXPECTORATED SPUTUM ASSESSMENT W REFEX TO RESP CULTURE

## 2017-06-01 MED ORDER — SODIUM CHLORIDE 0.9 % IV SOLN: 100.0000 mL | INTRAVENOUS | Status: DC | PRN

## 2017-06-01 MED ORDER — HEPARIN SODIUM (PORCINE) 1000 UNIT/ML DIALYSIS: 1000.0000 [IU] | INTRAMUSCULAR | Status: DC | PRN

## 2017-06-01 MED ORDER — CALCITRIOL 0.25 MCG PO CAPS
ORAL_CAPSULE | ORAL | Status: AC
  Filled 2017-06-01: qty 1

## 2017-06-01 MED ORDER — CALCITRIOL 0.5 MCG PO CAPS
ORAL_CAPSULE | ORAL | Status: AC
Start: 2017-06-01 — End: 2017-06-02
  Filled 2017-06-01: qty 2

## 2017-06-01 MED ORDER — ALTEPLASE 2 MG IJ SOLR: 2.0000 mg | Freq: Once | INTRAMUSCULAR | Status: DC | PRN

## 2017-06-01 NOTE — Progress Notes (Signed)
PHARMACY - PHYSICIAN COMMUNICATION CRITICAL VALUE ALERT - BLOOD CULTURE IDENTIFICATION (BCID)  Kirk [name] is an 58 y.o. male who presented to [facility] on 05/30/2017   Name of physician (or Provider) Contacted: Arrien  Current antibiotics: Ceftriaxone + azithromycin  Changes to prescribed antibiotics recommended:  No changes needed  Results for orders placed or performed during the hospital encounter of 05/30/17  Blood Culture ID Panel (Reflexed) (Collected: 05/30/2017 10:40 PM)  Result Value Ref Range   Enterococcus species NOT DETECTED NOT DETECTED   Listeria monocytogenes NOT DETECTED NOT DETECTED   Staphylococcus species NOT DETECTED NOT DETECTED   Staphylococcus aureus NOT DETECTED NOT DETECTED   Streptococcus species NOT DETECTED NOT DETECTED   Streptococcus agalactiae NOT DETECTED NOT DETECTED   Streptococcus pneumoniae NOT DETECTED NOT DETECTED   Streptococcus pyogenes NOT DETECTED NOT DETECTED   Acinetobacter baumannii NOT DETECTED NOT DETECTED   Enterobacteriaceae species NOT DETECTED NOT DETECTED   Enterobacter cloacae complex NOT DETECTED NOT DETECTED   Escherichia coli NOT DETECTED NOT DETECTED   Klebsiella oxytoca NOT DETECTED NOT DETECTED   Klebsiella pneumoniae NOT DETECTED NOT DETECTED   Proteus species NOT DETECTED NOT DETECTED   Serratia marcescens NOT DETECTED NOT DETECTED   Haemophilus influenzae NOT DETECTED NOT DETECTED   Neisseria meningitidis NOT DETECTED NOT DETECTED   Pseudomonas aeruginosa NOT DETECTED NOT DETECTED   Candida albicans NOT DETECTED NOT DETECTED   Candida glabrata NOT DETECTED NOT DETECTED   Candida krusei NOT DETECTED NOT DETECTED   Candida parapsilosis NOT DETECTED NOT DETECTED   Candida tropicalis NOT DETECTED NOT DETECTED    [name], Rande Lawman 06/01/2017  1:24 PM

## 2017-06-01 NOTE — Progress Notes (Signed)
PROGRESS NOTE    Kirk [name]  GGY:[id number] DOB: 11/08/1960 DOA: 05/30/2017 PCP: Gayland Curry, DO    Brief Narrative:  57 year old male who presented with acute respiratory distress.  He does have the significant past medical history of end-stage renal disease on hemodialysis, chronic pancytopenia, hypertension, anxiety and obstructive sleep apnea.  Recent hospitalization for pneumonia about a month ago.  After his hospitalization in terms of his dyspnea he never did return to his baseline.  On the day of admission was noted in severe respiratory distress by his primary care physician.  On the initial physical examination he was noted in respiratory distress, he was placed on noninvasive mechanical ventilation, his blood pressure was 164/85, heart rate 74, respiratory rate 26 and oxygen saturation 100% on BiPAP.  Patient was in distress, he had decreased breath sounds bilaterally with rhonchi, heart S1-S2 present, rhythmic, abdomen soft nontender, trace lower extremity edema bilaterally.  Sodium 140, potassium 4.0, chloride 99, bicarb 27, glucose 130, BUN 30, creatinine 11.3, white count 2.5, hemoglobin 9.3, hematocrit 28.7, platelets 103.  His venous pH was 7.48.  Chest x-ray with mild cardiomegaly, increased lung markings bilaterally, bibasilar atelectasis.  CT chest negative for pulmonary embolism, bibasilar infiltrates.  EKG sinus rhythm, normal axis, normal intervals, LVH with repolarization changes.   Patient was admitted to the hospital with a working diagnosis of acute hypoxic respiratory failure due to bilateral lower lobe pneumonia.   Assessment & Plan:   Principal Problem:   HCAP (healthcare-associated pneumonia) Active Problems:   OSA on CPAP   HTN (hypertension)   Pancytopenia (HCC)   Obesity hypoventilation syndrome (HCC)   Acute respiratory distress   ESRD (end stage renal disease) (HCC)   Anxiety    1.  Acute hypoxic respiratory failure due to bilateral lower lobes  pneumonia complicated by COPD exacerbation. Continue ceftriaxone and azithromycin for antibiotic therapy, continue to  follow on cultures, cell count and temperature curve. Symptoms continue to improve, will continue aggressive bronchodilator therapy and systemic steroids. Continue oxymetry monitoring and supplemental 02 per [place]. Continue dulera. (mometasone-formoterol)  2.  End-stage renal disease on hemodialysis. On calcitriol and phoslo and sensipar for mineral bone disease. Patient scheduled for HD today.   3.  Pancytopenia. Stable cell counts, will continue close monitoring, no need for blood products transfusion.   4.  Obstructive sleep apnea. Tolerating well pap at night per home regimen.   5.  Anxiety. On clonazepam, no agitation or confusion.   6. Dyslipidemia. On pravastatin with good toleration.   7. HTN. On metoprolol 75 mg po bid and hydralazine 50 mg tid. Systolic blood pressure 035 to 170 mmHg.   DVT prophylaxis: heparin   Code Status:  full Family Communication: no family at the bedside Disposition Plan:  Home when stable    Consultants:    Nephrology   Procedures:     Antimicrobials:   Azithromycin   Ceftriaxone.      Subjective: Patient feeling better, but no back to his baseline, continue cough, dyspnea and congestion. No nausea or vomiting, has been out of the bed.   Objective: Vitals:   05/31/17 2350 06/01/17 0134 06/01/17 0423 06/01/17 0500  BP: (!) 176/74  (!) 164/63   Pulse: 78  73   Resp: 14  12   Temp: 98.2 F (36.8 C)  97.7 F (36.5 C)   TempSrc: Oral  Axillary   SpO2: 97% 98% 98%   Weight:    98 kg (216 lb)  Height:  No intake or output data in the 24 hours ending 06/01/17 0743 Filed Weights   05/30/17 1923 05/31/17 0100 06/01/17 0500  Weight: 95.6 kg (210 lb 12.2 oz) 95.7 kg (210 lb 15.7 oz) 98 kg (216 lb)    Examination:   General: Not in pain or dyspnea Neurology: Awake and alert, non focal  E ENT: mild  pallor, no icterus, oral mucosa moist Cardiovascular: No JVD. S1-S2 present, rhythmic, no gallops, rubs, or murmurs. No lower extremity edema. Pulmonary: decreased breath sounds bilaterally, decreased air movement, positive expiratory wheezing, scattered bilateral rhonchi and rales. Gastrointestinal. Abdomen with no organomegaly, non tender, no rebound or guarding Skin. No rashes Musculoskeletal: no joint deformities     Data Reviewed: I have personally reviewed following labs and imaging studies  CBC: Recent Labs  Lab 05/30/17 1448 05/31/17 0313 06/01/17 0350  WBC 2.5* 4.6 4.2  NEUTROABS 1.4* 3.8 3.7  HGB 9.3* 9.1* 9.0*  HCT 28.7* 28.7* 27.7*  MCV 98.0 97.6 96.2  PLT 103* 113* 829*   Basic Metabolic Panel: Recent Labs  Lab 05/30/17 1448 05/31/17 0313 06/01/17 0350  NA 140 138 136  K 4.0 4.3 4.7  CL 99* 99* 97*  CO2 27 26 24   GLUCOSE 130* 109* 220*  BUN 30* 25* 54*  CREATININE 11.37* 8.51* 12.06*  CALCIUM 9.1 8.6* 9.2  PHOS  --  5.3*  --    GFR: Estimated Creatinine Clearance: 7.7 mL/min (A) (by C-G formula based on SCr of 12.06 mg/dL (H)). Liver Function Tests: Recent Labs  Lab 05/30/17 1448 05/31/17 0313  AST 21  --   ALT 22  --   ALKPHOS 55  --   BILITOT 0.8  --   PROT 6.7  --   ALBUMIN 3.5 3.4*   No results for input(s): LIPASE, AMYLASE in the last 168 hours. No results for input(s): AMMONIA in the last 168 hours. Coagulation Profile: No results for input(s): INR, PROTIME in the last 168 hours. Cardiac Enzymes: No results for input(s): CKTOTAL, CKMB, CKMBINDEX, TROPONINI in the last 168 hours. BNP (last 3 results) No results for input(s): PROBNP in the last 8760 hours. HbA1C: No results for input(s): HGBA1C in the last 72 hours. CBG: No results for input(s): GLUCAP in the last 168 hours. Lipid Profile: No results for input(s): CHOL, HDL, LDLCALC, TRIG, CHOLHDL, LDLDIRECT in the last 72 hours. Thyroid Function Tests: No results for input(s):  TSH, T4TOTAL, FREET4, T3FREE, THYROIDAB in the last 72 hours. Anemia Panel: No results for input(s): VITAMINB12, FOLATE, FERRITIN, TIBC, IRON, RETICCTPCT in the last 72 hours.    Radiology Studies: I have reviewed all of the imaging during this hospital visit personally     Scheduled Meds: . aspirin EC  81 mg Oral Daily  . azithromycin  500 mg Oral Daily  . [START ON 06/03/2017] calcitRIOL  1.25 mcg Oral Q M,W,F-HD  . calcium acetate  5,336 mg Oral TID WC  . cholecalciferol  5,000 Units Oral Daily  . cinacalcet  30 mg Oral Q supper  . clonazePAM  0.5 mg Oral BID  . [START ON 06/03/2017] darbepoetin (ARANESP) injection - DIALYSIS  100 mcg Intravenous Q Mon-HD  . heparin  3,000 Units Dialysis Once in dialysis  . heparin  5,000 Units Subcutaneous Q8H  . hydrALAZINE  50 mg Oral Q8H  . ipratropium-albuterol  3 mL Nebulization Q6H  . methylPREDNISolone (SOLU-MEDROL) injection  40 mg Intravenous Q12H  . metoprolol tartrate  75 mg Oral BID  . mometasone-formoterol  2 puff Inhalation BID  . nicotine  14 mg Transdermal Daily  . pantoprazole  40 mg Oral BID AC  . pravastatin  40 mg Oral q1800  . sodium chloride flush  3 mL Intravenous Q12H  . sodium chloride flush  3 mL Intravenous Q12H   Continuous Infusions: . sodium chloride    . sodium chloride    . sodium chloride    . cefTRIAXone (ROCEPHIN)  IV Stopped (05/31/17 1844)     LOS: 2 days        Shrita Thien Gerome Apley, MD [facility] Pager [phone]

## 2017-06-01 NOTE — Progress Notes (Signed)
[facility] Progress Note    Subjective:    Feels better today but still with "deep cough"   Objective:   BP (!) 164/63 (BP Location: Right Arm)   Pulse 73   Temp 97.7 F (36.5 C) (Axillary)   Resp (!) 22   Ht 5\' 7"  (1.702 m)   Wt 98 kg (216 lb)   SpO2 97%   BMI 33.83 kg/m   Intake/Output: I/O last 3 completed shifts: In: 600 [IV Piggyback:600] Out: 3386 [Other:3386]   Intake/Output this shift:  Total I/O In: 240 [P.O.:240] Out: -  Weight change: 2.377 kg (5 lb 3.8 oz)  Physical Exam: Gen: NAd CVS: no rub Resp: scattered rhonchi and exp wheezes  Abd: +BS, soft, NT/ND Ext: minimal pretibial edema, LFA AVG +T/B  Labs: BMET Recent Labs  Lab 05/30/17 1448 05/31/17 0313 06/01/17 0350  NA 140 138 136  K 4.0 4.3 4.7  CL 99* 99* 97*  CO2 27 26 24   GLUCOSE 130* 109* 220*  BUN 30* 25* 54*  CREATININE 11.37* 8.51* 12.06*  ALBUMIN 3.5 3.4*  --   CALCIUM 9.1 8.6* 9.2  PHOS  --  5.3*  --    CBC Recent Labs  Lab 05/30/17 1448 05/31/17 0313 06/01/17 0350  WBC 2.5* 4.6 4.2  NEUTROABS 1.4* 3.8 3.7  HGB 9.3* 9.1* 9.0*  HCT 28.7* 28.7* 27.7*  MCV 98.0 97.6 96.2  PLT 103* 113* 106*    @IMGRELPRIORS @ Medications:    . aspirin EC  81 mg Oral Daily  . azithromycin  500 mg Oral Daily  . [START ON 06/03/2017] calcitRIOL  1.25 mcg Oral Q M,W,F-HD  . calcium acetate  5,336 mg Oral TID WC  . cholecalciferol  5,000 Units Oral Daily  . cinacalcet  30 mg Oral Q supper  . clonazePAM  0.5 mg Oral BID  . [START ON 06/03/2017] darbepoetin (ARANESP) injection - DIALYSIS  100 mcg Intravenous Q Mon-HD  . heparin  3,000 Units Dialysis Once in dialysis  . heparin  5,000 Units Subcutaneous Q8H  . hydrALAZINE  50 mg Oral Q8H  . ipratropium-albuterol  3 mL Nebulization Q6H  . methylPREDNISolone (SOLU-MEDROL) injection  40 mg Intravenous Q12H  . metoprolol tartrate  75 mg Oral BID  . mometasone-formoterol  2 puff Inhalation BID  . nicotine  14 mg Transdermal  Daily  . pantoprazole  40 mg Oral BID AC  . pravastatin  40 mg Oral q1800  . sodium chloride flush  3 mL Intravenous Q12H   Dialysis Orders: Center: [facility]   on MWF . EDW 98 kgHD Bath 2k, 2ca  Time 4hr 5min Heparin 4000. Access LFA AVG Calcitriol 1.5 mcg po / hd   Mircerfa 100 mcg (last given 75 mcg 05/22/17)   Assessment/ Plan:   1. Bilat Lobe PNA /COPD exacerbation= per admit team  2. ESRD -  HD MWF , had hd yest 9th  on wed 8th at outpt unit , k 4.3 next HD today and will then back on schedule Monday  3. Hypertension/volume  - 3l uf yest and below edw 95.7 (8 old edw)  Lower edw at dc , attempt 3 liter uf in am hD / op meds lopressor, hydralazine  4. Anemia  - hgb 9.1  esa on hd  Next on Monday  5/13=100 mcg  aranesp if here  5. Metabolic bone disease -  Po vit d on hd , binders with food  6. COPD/ Tobacco abuse = had discussion  at contiued dangers , he plans to dc with PC giving patch  7. Nutrition -renal diet renal vit. 8. OSA - cpap    Donetta Potts, MD [place] Pager [phone] 06/01/2017, 11:38 AM

## 2017-06-01 NOTE — Progress Notes (Signed)
Patient transported to HD via bed °

## 2017-06-01 NOTE — Plan of Care (Signed)
Care plans reviewed and patient is progressing.  

## 2017-06-02 DIAGNOSIS — J440 Chronic obstructive pulmonary disease with acute lower respiratory infection: Secondary | ICD-10-CM

## 2017-06-02 DIAGNOSIS — J209 Acute bronchitis, unspecified: Secondary | ICD-10-CM

## 2017-06-02 LAB — BASIC METABOLIC PANEL
Anion gap: 13 (ref 5–15)
BUN: 41 mg/dL — ABNORMAL HIGH (ref 6–20)
CHLORIDE: 101 mmol/L (ref 101–111)
CO2: 27 mmol/L (ref 22–32)
Calcium: 9.5 mg/dL (ref 8.9–10.3)
Creatinine, Ser: 10.16 mg/dL — ABNORMAL HIGH (ref 0.61–1.24)
GFR calc Af Amer: 6 mL/min — ABNORMAL LOW (ref >60)
GFR calc non Af Amer: 5 mL/min — ABNORMAL LOW (ref >60)
GLUCOSE: 128 mg/dL — AB (ref 65–99)
POTASSIUM: 4.1 mmol/L (ref 3.5–5.1)
Sodium: 141 mmol/L (ref 135–145)

## 2017-06-02 LAB — CBC WITH DIFFERENTIAL/PLATELET
Basophils Absolute: 0 10*3/uL (ref 0.0–0.1)
Basophils Relative: 0 %
Eosinophils Absolute: 0 10*3/uL (ref 0.0–0.7)
Eosinophils Relative: 0 %
HEMATOCRIT: 29.5 % — AB (ref 39.0–52.0)
HEMOGLOBIN: 9.5 g/dL — AB (ref 13.0–17.0)
LYMPHS PCT: 9 %
Lymphs Abs: 0.8 10*3/uL (ref 0.7–4.0)
MCH: 31.1 pg (ref 26.0–34.0)
MCHC: 32.2 g/dL (ref 30.0–36.0)
MCV: 96.7 fL (ref 78.0–100.0)
MONOS PCT: 6 %
Monocytes Absolute: 0.6 10*3/uL (ref 0.1–1.0)
NEUTROS PCT: 85 %
Neutro Abs: 7.9 10*3/uL — ABNORMAL HIGH (ref 1.7–7.7)
Platelets: 131 10*3/uL — ABNORMAL LOW (ref 150–400)
RBC: 3.05 MIL/uL — AB (ref 4.22–5.81)
RDW: 13.6 % (ref 11.5–15.5)
WBC: 9.3 10*3/uL (ref 4.0–10.5)

## 2017-06-02 LAB — GLUCOSE, CAPILLARY: GLUCOSE-CAPILLARY: 134 mg/dL — AB (ref 65–99)

## 2017-06-02 MED ORDER — GUAIFENESIN-DM 100-10 MG/5ML PO SYRP
10.0000 mL | ORAL_SOLUTION | Freq: Four times a day (QID) | ORAL | Status: DC
  Administered 2017-06-02 – 2017-06-04 (×8): 10 mL via ORAL
  Filled 2017-06-02 (×8): qty 10

## 2017-06-02 MED ORDER — IPRATROPIUM-ALBUTEROL 0.5-2.5 (3) MG/3ML IN SOLN
3.0000 mL | Freq: Three times a day (TID) | RESPIRATORY_TRACT | Status: DC
  Administered 2017-06-02 – 2017-06-04 (×8): 3 mL via RESPIRATORY_TRACT
  Filled 2017-06-02 (×9): qty 3

## 2017-06-02 MED ORDER — IPRATROPIUM-ALBUTEROL 0.5-2.5 (3) MG/3ML IN SOLN
3.0000 mL | Freq: Two times a day (BID) | RESPIRATORY_TRACT | Status: DC
  Administered 2017-06-02: 3 mL via RESPIRATORY_TRACT
  Filled 2017-06-02: qty 3

## 2017-06-02 NOTE — Plan of Care (Signed)
Care plan reviewed and patient is progressing.  

## 2017-06-02 NOTE — Progress Notes (Signed)
Patient wished to sleep at this time instead of taking breathing treatment. Patient resting chest rise and fall observed, no distress noted.

## 2017-06-02 NOTE — Progress Notes (Signed)
PROGRESS NOTE    Kirk [name]  BDZ:[id number] DOB: 1960/04/03 DOA: 05/30/2017 PCP: Gayland Curry, DO    Brief Narrative:  57 year old male who presented with acute respiratory distress. He does have thesignificant past medical history of end-stage renal disease on hemodialysis, chronic pancytopenia, hypertension, anxiety andobstructive sleep apnea.Recent hospitalization for pneumonia about a month ago. After his hospitalization in terms of his dyspnea he never did return to his baseline.On the day of admission was noted in severe respiratory distress by his primary care physician.On the initial physical examination he was noted in respiratory distress, he was placed on noninvasive mechanical ventilation, his blood pressure was 164/85, heart rate 74, respiratory rate26 and oxygen saturation 100% on BiPAP.Patient was in distress, he had decreased breath sounds bilaterally with rhonchi, heart S1-S2 present, rhythmic, abdomen soft nontender, trace lower extremity edema bilaterally.Sodium 140, potassium 4.0, chloride 99, bicarb 27, glucose 130, BUN 30, creatinine 11.3, white count 2.5, hemoglobin 9.3, hematocrit 28.7, platelets 103.His venous pH was 7.48.Chest x-ray with mild cardiomegaly, increased lung markings bilaterally, bibasilar atelectasis. CT chest negative for pulmonary embolism, bibasilar infiltrates. EKG sinus rhythm, normal axis, normal intervals, LVH with repolarization changes.  Patient was admitted to the hospital with a working diagnosis of acute hypoxic respiratory failure due to bilateral lower lobe pneumonia  Assessment & Plan:   Principal Problem:   HCAP (healthcare-associated pneumonia) Active Problems:   OSA on CPAP   HTN (hypertension)   Pancytopenia (HCC)   Obesity hypoventilation syndrome (HCC)   Acute respiratory distress   ESRD (end stage renal disease) ([place])   Anxiety   1.Acute hypoxic respiratory failure due to bilateral lower  lobespneumonia complicated by COPD exacerbation. Antibiotic therapy with ceftriaxone and azithromycin. Continue with aggressive bronchodilator therapy and systemic steroids. Oxymetry monitoring and supplemental 02 per [place]. Continue dulera. (mometasone-formoterol). Will change guiaifenesin dm to q 6 hors scheduled due to sever coughing episodes that trigger worsening symptoms. Continue hypertonic saline, flutter valve and will add chest PT.   2.End-stage renal disease on hemodialysis. Continue calcitriol and phoslo. Scheduled for HD in am. Patient clinically euvolemic with no signs of uremia.   3.Pancytopenia. Counts with wbc at 9,3 with hb up to 9.5 and plt at 131. Continue close follow up of cell count.  4.Obstructive sleep apnea. Conrtinue  C-pap at night, with good toleration.  5.Anxiety. Continue with clonazepam.   6. Dyslipidemia. continue pravastatin.   7. HTN. Continue with metoprolol 75 mg po bid and hydralazine 50 mg tid.  DVT prophylaxis:heparin Code Status:full Family Communication:no family at the bedside Disposition Plan:Home in am after HD.    Consultants:  Nephrology  Procedures:    Antimicrobials:  Azithromycin   Ceftriaxone.   Subjective: Patient continue to have coughing episodes that are triggering worsening dyspnea and coughing. No chest pain, no nausea or vomiting, tolerated HD well yesterday. Has been out of bed.   Objective: Vitals:   06/01/17 1916 06/01/17 2120 06/02/17 0144 06/02/17 0522  BP: (!) 162/75     Pulse: 84  74   Resp: 18  17   Temp: 97.8 F (36.6 C) 98.6 F (37 C)    TempSrc: Oral Oral    SpO2: 99%  95%   Weight: 96.6 kg (212 lb 15.4 oz)   97.8 kg (215 lb 9.6 oz)  Height:        Intake/Output Summary (Last 24 hours) at 06/02/2017 0817 Last data filed at 06/01/2017 2030 Gross per 24 hour  Intake 480 ml  Output  3000 ml  Net -2520 ml   Filed Weights   06/01/17 1540 06/01/17 1916 06/02/17  0522  Weight: 99.8 kg (220 lb 0.3 oz) 96.6 kg (212 lb 15.4 oz) 97.8 kg (215 lb 9.6 oz)    Examination:   General: Not in pain, but in dyspnea Neurology: Awake and alert, non focal  E ENT: no pallor, no icterus, oral mucosa moist Cardiovascular: No JVD. S1-S2 present, rhythmic, no gallops, rubs, or murmurs. No lower extremity edema. Pulmonary: decreased breath sounds bilaterally, decreased air movement, positive expiratory wheezing bilaterally and diffuse, no significant rhonchi or rales. Gastrointestinal. Abdomen with no organomegaly, non tender, no rebound or guarding Skin. No rashes Musculoskeletal: no joint deformities     Data Reviewed: I have personally reviewed following labs and imaging studies  CBC: Recent Labs  Lab 05/30/17 1448 05/31/17 0313 06/01/17 0350 06/02/17 0725  WBC 2.5* 4.6 4.2 9.3  NEUTROABS 1.4* 3.8 3.7 7.9*  HGB 9.3* 9.1* 9.0* 9.5*  HCT 28.7* 28.7* 27.7* 29.5*  MCV 98.0 97.6 96.2 96.7  PLT 103* 113* 106* 419*   Basic Metabolic Panel: Recent Labs  Lab 05/30/17 1448 05/31/17 0313 06/01/17 0350  NA 140 138 136  K 4.0 4.3 4.7  CL 99* 99* 97*  CO2 27 26 24   GLUCOSE 130* 109* 220*  BUN 30* 25* 54*  CREATININE 11.37* 8.51* 12.06*  CALCIUM 9.1 8.6* 9.2  PHOS  --  5.3*  --    GFR: Estimated Creatinine Clearance: 7.7 mL/min (A) (by C-G formula based on SCr of 12.06 mg/dL (H)). Liver Function Tests: Recent Labs  Lab 05/30/17 1448 05/31/17 0313  AST 21  --   ALT 22  --   ALKPHOS 55  --   BILITOT 0.8  --   PROT 6.7  --   ALBUMIN 3.5 3.4*   No results for input(s): LIPASE, AMYLASE in the last 168 hours. No results for input(s): AMMONIA in the last 168 hours. Coagulation Profile: No results for input(s): INR, PROTIME in the last 168 hours. Cardiac Enzymes: No results for input(s): CKTOTAL, CKMB, CKMBINDEX, TROPONINI in the last 168 hours. BNP (last 3 results) No results for input(s): PROBNP in the last 8760 hours. HbA1C: No results for  input(s): HGBA1C in the last 72 hours. CBG: Recent Labs  Lab 06/02/17 0640  GLUCAP 134*   Lipid Profile: No results for input(s): CHOL, HDL, LDLCALC, TRIG, CHOLHDL, LDLDIRECT in the last 72 hours. Thyroid Function Tests: No results for input(s): TSH, T4TOTAL, FREET4, T3FREE, THYROIDAB in the last 72 hours. Anemia Panel: No results for input(s): VITAMINB12, FOLATE, FERRITIN, TIBC, IRON, RETICCTPCT in the last 72 hours.    Radiology Studies: I have reviewed all of the imaging during this hospital visit personally     Scheduled Meds: . aspirin EC  81 mg Oral Daily  . azithromycin  500 mg Oral Daily  . [START ON 06/03/2017] calcitRIOL  1.25 mcg Oral Q M,W,F-HD  . calcium acetate  5,336 mg Oral TID WC  . cholecalciferol  5,000 Units Oral Daily  . cinacalcet  30 mg Oral Q supper  . clonazePAM  0.5 mg Oral BID  . [START ON 06/03/2017] darbepoetin (ARANESP) injection - DIALYSIS  100 mcg Intravenous Q Mon-HD  . heparin  3,000 Units Dialysis Once in dialysis  . heparin  5,000 Units Subcutaneous Q8H  . hydrALAZINE  50 mg Oral Q8H  . ipratropium-albuterol  3 mL Nebulization BID  . methylPREDNISolone (SOLU-MEDROL) injection  40 mg Intravenous Q12H  .  metoprolol tartrate  75 mg Oral BID  . mometasone-formoterol  2 puff Inhalation BID  . nicotine  14 mg Transdermal Daily  . pantoprazole  40 mg Oral BID AC  . pravastatin  40 mg Oral q1800  . sodium chloride flush  3 mL Intravenous Q12H   Continuous Infusions: . sodium chloride    . sodium chloride    . cefTRIAXone (ROCEPHIN)  IV Stopped (05/31/17 1844)     LOS: 3 days        Mauricio Gerome Apley, MD [facility] Pager [phone]

## 2017-06-02 NOTE — Progress Notes (Signed)
[facility] Progress Note    Subjective:   Still with deep cough but improved breathing.   Objective:   BP (!) 162/75 (BP Location: Right Arm)   Pulse 92   Temp 98.6 F (37 C) (Oral)   Resp 17   Ht 5\' 7"  (1.702 m)   Wt 97.8 kg (215 lb 9.6 oz)   SpO2 93%   BMI 33.77 kg/m   Intake/Output: I/O last 3 completed shifts: In: 720 [P.O.:480; Other:240] Out: 3000 [Other:3000]   Intake/Output this shift:  Total I/O In: 120 [P.O.:120] Out: -  Weight change: 1.823 kg (4 lb 0.3 oz)  Physical Exam: Gen: NAD CVS: no rub Resp: scattered rhonchi Abd: +BS, soft, NT/ND Ext: no edema, LFA AVF +T/B  Labs: BMET Recent Labs  Lab 05/30/17 1448 05/31/17 0313 06/01/17 0350 06/02/17 0725  NA 140 138 136 141  K 4.0 4.3 4.7 4.1  CL 99* 99* 97* 101  CO2 27 26 24 27   GLUCOSE 130* 109* 220* 128*  BUN 30* 25* 54* 41*  CREATININE 11.37* 8.51* 12.06* 10.16*  ALBUMIN 3.5 3.4*  --   --   CALCIUM 9.1 8.6* 9.2 9.5  PHOS  --  5.3*  --   --    CBC Recent Labs  Lab 05/30/17 1448 05/31/17 0313 06/01/17 0350 06/02/17 0725  WBC 2.5* 4.6 4.2 9.3  NEUTROABS 1.4* 3.8 3.7 7.9*  HGB 9.3* 9.1* 9.0* 9.5*  HCT 28.7* 28.7* 27.7* 29.5*  MCV 98.0 97.6 96.2 96.7  PLT 103* 113* 106* 131*    @IMGRELPRIORS @ Medications:    . aspirin EC  81 mg Oral Daily  . azithromycin  500 mg Oral Daily  . [START ON 06/03/2017] calcitRIOL  1.25 mcg Oral Q M,W,F-HD  . calcium acetate  5,336 mg Oral TID WC  . cholecalciferol  5,000 Units Oral Daily  . cinacalcet  30 mg Oral Q supper  . clonazePAM  0.5 mg Oral BID  . [START ON 06/03/2017] darbepoetin (ARANESP) injection - DIALYSIS  100 mcg Intravenous Q Mon-HD  . guaiFENesin-dextromethorphan  10 mL Oral Q6H  . heparin  3,000 Units Dialysis Once in dialysis  . heparin  5,000 Units Subcutaneous Q8H  . hydrALAZINE  50 mg Oral Q8H  . ipratropium-albuterol  3 mL Nebulization TID  . methylPREDNISolone (SOLU-MEDROL) injection  40 mg Intravenous Q12H   . metoprolol tartrate  75 mg Oral BID  . mometasone-formoterol  2 puff Inhalation BID  . nicotine  14 mg Transdermal Daily  . pantoprazole  40 mg Oral BID AC  . pravastatin  40 mg Oral q1800  . sodium chloride flush  3 mL Intravenous Q12H   Dialysis Orders: Center:[facility] MWF. KGU54 kgHD Bath2k, 2caTime 4hr 44minHeparin 4000. AccessLFA AVF Calcitriol 1.105mcg po/hd  Mircerfa 100 mcg (last given 75 mcg 05/22/17)   Assessment/ Plan:   1. Bilat Lobe PNA /COPD exacerbation= per admit team.  May benefit from chest PT 2. ESRD -HD MWF , was off HD and had HD yesterday.  Will get back on schedule Monday.  Was below edw yesterday and will challenge tomorrow.   3. Hypertension/volume - 3l uf yest and below edw 95.7 (20 old edw) Lower edw at dc , attempt 3 liter uf in am hD / op meds lopressor, hydralazine  4. Anemia - hgb 9.1 esa on hd Next on Monday 5/13=100 mcg aranesp if here  5. Metabolic bone disease -Po vit d on hd , binders with food  6. COPD/  Tobacco abuse = had discussion at contiued dangers , he plans to stop given his recurrent pneumonia and doing well on nicotine patch and will need script at discharge. 7. Nutrition -renal diet renal vit. 8. OSA - cpap  Donetta Potts, MD [place] Pager [phone] 06/02/2017, 11:28 AM

## 2017-06-03 DIAGNOSIS — J441 Chronic obstructive pulmonary disease with (acute) exacerbation: Secondary | ICD-10-CM

## 2017-06-03 LAB — CBC
HCT: 26.3 % — ABNORMAL LOW (ref 39.0–52.0)
HEMOGLOBIN: 8.7 g/dL — AB (ref 13.0–17.0)
MCH: 31.3 pg (ref 26.0–34.0)
MCHC: 33.1 g/dL (ref 30.0–36.0)
MCV: 94.6 fL (ref 78.0–100.0)
Platelets: 107 10*3/uL — ABNORMAL LOW (ref 150–400)
RBC: 2.78 MIL/uL — AB (ref 4.22–5.81)
RDW: 13.4 % (ref 11.5–15.5)
WBC: 9.4 10*3/uL (ref 4.0–10.5)

## 2017-06-03 LAB — CULTURE, RESPIRATORY W GRAM STAIN

## 2017-06-03 LAB — CULTURE, BLOOD (ROUTINE X 2): Special Requests: ADEQUATE

## 2017-06-03 LAB — BASIC METABOLIC PANEL
ANION GAP: 16 — AB (ref 5–15)
BUN: 63 mg/dL — ABNORMAL HIGH (ref 6–20)
CHLORIDE: 98 mmol/L — AB (ref 101–111)
CO2: 27 mmol/L (ref 22–32)
Calcium: 9.6 mg/dL (ref 8.9–10.3)
Creatinine, Ser: 12.24 mg/dL — ABNORMAL HIGH (ref 0.61–1.24)
GFR calc Af Amer: 5 mL/min — ABNORMAL LOW (ref >60)
GFR, EST NON AFRICAN AMERICAN: 4 mL/min — AB (ref >60)
GLUCOSE: 201 mg/dL — AB (ref 65–99)
POTASSIUM: 4.6 mmol/L (ref 3.5–5.1)
Sodium: 141 mmol/L (ref 135–145)

## 2017-06-03 LAB — GLUCOSE, CAPILLARY
GLUCOSE-CAPILLARY: 214 mg/dL — AB (ref 65–99)
GLUCOSE-CAPILLARY: 206 mg/dL — AB (ref 65–99)
Glucose-Capillary: 151 mg/dL — ABNORMAL HIGH (ref 65–99)

## 2017-06-03 LAB — CULTURE, RESPIRATORY: CULTURE: NORMAL

## 2017-06-03 MED ORDER — METHYLPREDNISOLONE SODIUM SUCC 40 MG IJ SOLR
40.0000 mg | Freq: Every day | INTRAMUSCULAR | Status: DC
  Administered 2017-06-04: 40 mg via INTRAVENOUS
  Filled 2017-06-03: qty 1

## 2017-06-03 MED ORDER — DARBEPOETIN ALFA 100 MCG/0.5ML IJ SOSY
PREFILLED_SYRINGE | INTRAMUSCULAR | Status: AC
  Administered 2017-06-03: 100 ug
  Filled 2017-06-03: qty 0.5

## 2017-06-03 MED ORDER — CALCITRIOL 0.5 MCG PO CAPS
ORAL_CAPSULE | ORAL | Status: AC
  Filled 2017-06-03: qty 2

## 2017-06-03 MED ORDER — CALCITRIOL 0.25 MCG PO CAPS
ORAL_CAPSULE | ORAL | Status: AC
  Filled 2017-06-03: qty 1

## 2017-06-03 MED ORDER — HEPARIN SODIUM (PORCINE) 1000 UNIT/ML DIALYSIS: 20.0000 [IU]/kg | INTRAMUSCULAR | Status: DC | PRN

## 2017-06-03 NOTE — Progress Notes (Signed)
PT Cancellation Note  Patient Details Name: MAXSON ODDO MRN: [id number] DOB: July 22, 1960   Cancelled Treatment:    Reason Eval/Treat Not Completed: Patient at procedure or test/unavailable; now in HD, will attempt later today as time allows.   [name] 06/03/2017, 8:22 AM  Magda Kiel, [place] 06/03/2017

## 2017-06-03 NOTE — Progress Notes (Signed)
Patient on CPAP at this time, did not want to do flutter therapy now nor did he want to be awoken at 4. No distress noted RCP will continue to monitor.

## 2017-06-03 NOTE — Progress Notes (Signed)
PROGRESS NOTE    Kirk [name]  LKG:[id number] DOB: Apr 08, 1960 DOA: 05/30/2017 PCP: Gayland Curry, DO    Brief Narrative:  57 year old male who presented with acute respiratory distress. He does have thesignificant past medical history of end-stage renal disease on hemodialysis, chronic pancytopenia, hypertension, anxiety andobstructive sleep apnea.Recent hospitalization for pneumonia about a month ago. After his hospitalization in terms of his dyspnea he never did return to his baseline.On the day of admission was noted in severe respiratory distress by his primary care physician.On the initial physical examination he was noted in respiratory distress, he was placed on noninvasive mechanical ventilation, his blood pressure was 164/85, heart rate 74, respiratory rate26 and oxygen saturation 100% on BiPAP.Patient was in distress, he had decreased breath sounds bilaterally with rhonchi, heart S1-S2 present, rhythmic, abdomen soft nontender, trace lower extremity edema bilaterally.Sodium 140, potassium 4.0, chloride 99, bicarb 27, glucose 130, BUN 30, creatinine 11.3, white count 2.5, hemoglobin 9.3, hematocrit 28.7, platelets 103.His venous pH was 7.48.Chest x-ray with mild cardiomegaly, increased lung markings bilaterally, bibasilar atelectasis. CT chest negative for pulmonary embolism, bibasilar infiltrates. EKG sinus rhythm, normal axis, normal intervals, LVH with repolarization changes.  Patient was admitted to the hospital with a working diagnosis of acute hypoxic respiratory failure due to bilateral lower lobe pneumonia   Assessment & Plan:   Principal Problem:   HCAP (healthcare-associated pneumonia) Active Problems:   OSA on CPAP   HTN (hypertension)   Pancytopenia (HCC)   Obesity hypoventilation syndrome (HCC)   Acute respiratory distress   ESRD (end stage renal disease) ([place])   Anxiety   1.Acute hypoxic respiratory failure due to bilateral lower  lobespneumonia complicated by COPD exacerbation.Continue #3/8 ceftriaxone and azithromycin. Slowly responding to aggressive bronchodilator therapy with duoneb and dulera. Will decrease methylprednisolone to 40 mg daily. Continue oxymetry monitoring and supplemental 02 per [place]. Secretion management with hypertonic saline, flutter valve and chest PT. Improved cough with scheduled guaifenesin dm.   2.End-stage renal disease on hemodialysis. HD today, will continue sensipar,calcitriol and phoslo.  3.Pancytopenia. Stable blood counts with wbc at 9,4 with hb 8,7  and plt at 107. Follow cell count in am. On darbepoetin on HD  4.Obstructive sleep apnea.On C-pap at night.   5.Anxiety. Tolerating wellclonazepam.  6. Dyslipidemia.On pravastatin 40 mg daily.   7. HTN.On metoprolol 75 mg po bid and hydralazine 50 mg tid.Blood pressure systolic 664 to 403 mmHg.   DVT prophylaxis:heparin Code Status:full Family Communication:no family at the bedside Disposition Plan:plan for discharge in am if continue to improve    Consultants:  Nephrology  Procedures:    Antimicrobials:  Azithromycin   Ceftriaxone.   Subjective: Patient with improved dyspnea and cough but not back to baseline, continue wheezing, dyspnea and congestion. No nausea or vomiting.   Objective: Vitals:   06/03/17 0919 06/03/17 0930 06/03/17 1000 06/03/17 1030  BP:  (!) 169/82 (!) 159/75 (!) 174/78  Pulse:  82 81 88  Resp:      Temp:      TempSrc:      SpO2: 95%     Weight:      Height:        Intake/Output Summary (Last 24 hours) at 06/03/2017 1110 Last data filed at 06/03/2017 0700 Gross per 24 hour  Intake 1160 ml  Output -  Net 1160 ml   Filed Weights   06/01/17 1916 06/02/17 0522 06/03/17 0757  Weight: 96.6 kg (212 lb 15.4 oz) 97.8 kg (215 lb 9.6 oz) 100.7  kg (222 lb 0.1 oz)    Examination: on HD  General: Not in pain or dyspnea, deconditioned Neurology:  Awake and alert, non focal  E ENT: mild pallor, no icterus, oral mucosa moist Cardiovascular: No JVD. S1-S2 present, rhythmic, no gallops, rubs, or murmurs. No lower extremity edema. Pulmonary: decreased breath sounds bilaterally, decreased air movement, positive expiratory wheezing, no significant rhonchi or rales. Gastrointestinal. Abdomen with no organomegaly, non tender, no rebound or guarding Skin. No rashes Musculoskeletal: no joint deformities     Data Reviewed: I have personally reviewed following labs and imaging studies  CBC: Recent Labs  Lab 05/30/17 1448 05/31/17 0313 06/01/17 0350 06/02/17 0725 06/03/17 0815  WBC 2.5* 4.6 4.2 9.3 9.4  NEUTROABS 1.4* 3.8 3.7 7.9*  --   HGB 9.3* 9.1* 9.0* 9.5* 8.7*  HCT 28.7* 28.7* 27.7* 29.5* 26.3*  MCV 98.0 97.6 96.2 96.7 94.6  PLT 103* 113* 106* 131* 749*   Basic Metabolic Panel: Recent Labs  Lab 05/30/17 1448 05/31/17 0313 06/01/17 0350 06/02/17 0725 06/03/17 0335  NA 140 138 136 141 141  K 4.0 4.3 4.7 4.1 4.6  CL 99* 99* 97* 101 98*  CO2 27 26 24 27 27   GLUCOSE 130* 109* 220* 128* 201*  BUN 30* 25* 54* 41* 63*  CREATININE 11.37* 8.51* 12.06* 10.16* 12.24*  CALCIUM 9.1 8.6* 9.2 9.5 9.6  PHOS  --  5.3*  --   --   --    GFR: Estimated Creatinine Clearance: 7.7 mL/min (A) (by C-G formula based on SCr of 12.24 mg/dL (H)). Liver Function Tests: Recent Labs  Lab 05/30/17 1448 05/31/17 0313  AST 21  --   ALT 22  --   ALKPHOS 55  --   BILITOT 0.8  --   PROT 6.7  --   ALBUMIN 3.5 3.4*   No results for input(s): LIPASE, AMYLASE in the last 168 hours. No results for input(s): AMMONIA in the last 168 hours. Coagulation Profile: No results for input(s): INR, PROTIME in the last 168 hours. Cardiac Enzymes: No results for input(s): CKTOTAL, CKMB, CKMBINDEX, TROPONINI in the last 168 hours. BNP (last 3 results) No results for input(s): PROBNP in the last 8760 hours. HbA1C: No results for input(s): HGBA1C in the last  72 hours. CBG: Recent Labs  Lab 06/02/17 0640 06/03/17 0620  GLUCAP 134* 151*   Lipid Profile: No results for input(s): CHOL, HDL, LDLCALC, TRIG, CHOLHDL, LDLDIRECT in the last 72 hours. Thyroid Function Tests: No results for input(s): TSH, T4TOTAL, FREET4, T3FREE, THYROIDAB in the last 72 hours. Anemia Panel: No results for input(s): VITAMINB12, FOLATE, FERRITIN, TIBC, IRON, RETICCTPCT in the last 72 hours.    Radiology Studies: I have reviewed all of the imaging during this hospital visit personally     Scheduled Meds: . aspirin EC  81 mg Oral Daily  . azithromycin  500 mg Oral Daily  . calcitRIOL  1.25 mcg Oral Q M,W,F-HD  . calcium acetate  5,336 mg Oral TID WC  . cholecalciferol  5,000 Units Oral Daily  . cinacalcet  30 mg Oral Q supper  . clonazePAM  0.5 mg Oral BID  . darbepoetin (ARANESP) injection - DIALYSIS  100 mcg Intravenous Q Mon-HD  . guaiFENesin-dextromethorphan  10 mL Oral Q6H  . heparin  3,000 Units Dialysis Once in dialysis  . heparin  5,000 Units Subcutaneous Q8H  . hydrALAZINE  50 mg Oral Q8H  . ipratropium-albuterol  3 mL Nebulization TID  . methylPREDNISolone (SOLU-MEDROL) injection  40 mg Intravenous Q12H  . metoprolol tartrate  75 mg Oral BID  . mometasone-formoterol  2 puff Inhalation BID  . nicotine  14 mg Transdermal Daily  . pantoprazole  40 mg Oral BID AC  . pravastatin  40 mg Oral q1800  . sodium chloride flush  3 mL Intravenous Q12H   Continuous Infusions: . sodium chloride    . sodium chloride    . cefTRIAXone (ROCEPHIN)  IV Stopped (06/02/17 1700)     LOS: 4 days        Mauricio Gerome Apley, MD [facility] Pager [phone]

## 2017-06-03 NOTE — Procedures (Signed)
Stable on hd today, inc'd goal for possible lean body wt loss  I was present at this dialysis session, have reviewed the session itself and made  appropriate changes Kelly Splinter MD [place] pager [phone]   06/03/2017, 9:13 AM

## 2017-06-03 NOTE — Evaluation (Signed)
Physical Therapy Evaluation Patient Details Name: Kirk [name] MRN: [id number] DOB: Jan 26, 1960 Today's Date: 06/03/2017   History of Present Illness  57 year old male who presented with acute respiratory distress.  He does have the significant past medical history of end-stage renal disease on hemodialysis, chronic pancytopenia, hypertension, anxiety and obstructive sleep apnea.  Recent hospitalization for pneumonia about a month ago.   Clinical Impression  Patient presents with decreased mobility due to back pain and generalized weakness and he will benefit from skilled PT in the acute setting to allow d/c home and likely will not need PT follow up.  Currently min A for short distance ambulation due to lower back pain that started since HD and is difficult to find comfortable position.  RN in end of session to deliver medication.  Will follow acutely.    Follow Up Recommendations Supervision for mobility/OOB;No PT follow up    Equipment Recommendations  None recommended by PT    Recommendations for Other Services       Precautions / Restrictions Precautions Precautions: Fall      Mobility  Bed Mobility Overal bed mobility: Modified Independent                Transfers Overall transfer level: Needs assistance   Transfers: Sit to/from Stand Sit to Stand: Min assist         General transfer comment: due to weakness/pain  Ambulation/Gait Ambulation/Gait assistance: Min assist Ambulation Distance (Feet): 80 Feet Assistive device: None Gait Pattern/deviations: Step-through pattern;Step-to pattern;Decreased stride length;Drifts right/left;Trunk flexed     General Gait Details: some imbalance and limited distance due to pain in lower back radiates to L LE with ambulation per pt  Stairs            Wheelchair Mobility    Modified Rankin (Stroke Patients Only)       Balance Overall balance assessment: Needs assistance   Sitting balance-Leahy Scale:  Good       Standing balance-Leahy Scale: Fair                               Pertinent Vitals/Pain Pain Assessment: 0-10 Pain Score: 10-Worst pain ever Pain Location: lower back Pain Descriptors / Indicators: Throbbing Pain Intervention(s): Monitored during session;Repositioned;Patient requesting pain meds-RN notified    Home Living Family/patient expects to be discharged to:: Private residence Living Arrangements: Spouse/significant other Available Help at Discharge: Family;Available PRN/intermittently Type of Home: House Home Access: Stairs to enter Entrance Stairs-Rails: None Entrance Stairs-Number of Steps: 3 Home Layout: One level Home Equipment: Walker - 4 wheels;Cane - single point      Prior Function Level of Independence: Independent         Comments: community ambulator, drives     Journalist, newspaper        Extremity/Trunk Assessment   Upper Extremity Assessment Upper Extremity Assessment: Overall WFL for tasks assessed    Lower Extremity Assessment Lower Extremity Assessment: Overall WFL for tasks assessed       Communication   Communication: No difficulties  Cognition Arousal/Alertness: Awake/alert Behavior During Therapy: WFL for tasks assessed/performed Overall Cognitive Status: Within Functional Limits for tasks assessed                                        General Comments      Exercises  Assessment/Plan    PT Assessment Patient needs continued PT services  PT Problem List Pain;Decreased balance;Decreased knowledge of use of DME;Decreased activity tolerance;Decreased safety awareness;Decreased mobility;Decreased strength       PT Treatment Interventions DME instruction;Therapeutic activities;Gait training;Therapeutic exercise;Patient/family education;Balance training;Functional mobility training    PT Goals (Current goals can be found in the Care Plan section)  Acute Rehab PT Goals Patient Stated  Goal: to return to baseline PT Goal Formulation: With patient Time For Goal Achievement: 06/10/17 Potential to Achieve Goals: Good    Frequency Min 3X/week   Barriers to discharge        Co-evaluation               AM-PAC PT "6 Clicks" Daily Activity  Outcome Measure Difficulty turning over in bed (including adjusting bedclothes, sheets and blankets)?: None Difficulty moving from lying on back to sitting on the side of the bed? : None Difficulty sitting down on and standing up from a chair with arms (e.g., wheelchair, bedside commode, etc,.)?: None Help needed moving to and from a bed to chair (including a wheelchair)?: A Little Help needed walking in hospital room?: A Little Help needed climbing 3-5 steps with a railing? : A Little 6 Click Score: 21    End of Session   Activity Tolerance: Patient limited by pain Patient left: in chair   PT Visit Diagnosis: Unsteadiness on feet (R26.81);Other abnormalities of gait and mobility (R26.89)    Time: 1447-1500 PT Time Calculation (min) (ACUTE ONLY): 13 min   Charges:   PT Evaluation $PT Eval Moderate Complexity: 1 Mod     PT G CodesMagda Kiel, [place] [phone] 06/03/2017   [name] 06/03/2017, 3:12 PM

## 2017-06-03 NOTE — Progress Notes (Signed)
Inpatient Diabetes Program Recommendations  AACE/ADA: New Consensus Statement on Inpatient Glycemic Control (2015)  Target Ranges:  Prepandial:   less than 140 mg/dL      Peak postprandial:   less than 180 mg/dL (1-2 hours)      Critically ill patients:  140 - 180 mg/dL   Lab Results  Component Value Date   GLUCAP 206 (H) 06/03/2017   HGBA1C 5.4 11/02/2016    Review of Glycemic ControlResults for JOHNCHARLES, FUSSELMAN (MRN [id number]) as of 06/03/2017 14:48  Ref. Range 06/01/2017 05:51 06/02/2017 06:40 06/03/2017 06:20 06/03/2017 12:56  Glucose-Capillary Latest Ref Range: 65 - 99 mg/dL 214 (H) 134 (H) 151 (H) 206 (H)   Diabetes history: None Current orders for Inpatient glycemic control:  Solumedrol 40 mg IV daily  Inpatient Diabetes Program Recommendations:   May consider adding Novolog correction sensitive tid with meals while on steroids.   Thanks,  Adah Perl, RN, BC-ADM Inpatient Diabetes Coordinator Pager [phone] (8a-5p)

## 2017-06-03 NOTE — Progress Notes (Signed)
[facility] Progress Note  Subjective: breathing gets better after HD but worse again this am, had "too much ice" yesterday  Vitals:   06/03/17 0520 06/03/17 0757 06/03/17 0809 06/03/17 0814  BP: (!) 168/64 (!) 187/94 (!) 190/88 (!) 188/94  Pulse:  80 80 81  Resp:  16    Temp:  97.9 F (36.6 C)    TempSrc: Oral Oral    SpO2:  97%    Weight:  100.7 kg (222 lb 0.1 oz)    Height:        Inpatient medications: . aspirin EC  81 mg Oral Daily  . azithromycin  500 mg Oral Daily  . calcitRIOL  1.25 mcg Oral Q M,W,F-HD  . calcium acetate  5,336 mg Oral TID WC  . cholecalciferol  5,000 Units Oral Daily  . cinacalcet  30 mg Oral Q supper  . clonazePAM  0.5 mg Oral BID  . darbepoetin (ARANESP) injection - DIALYSIS  100 mcg Intravenous Q Mon-HD  . guaiFENesin-dextromethorphan  10 mL Oral Q6H  . heparin  3,000 Units Dialysis Once in dialysis  . heparin  5,000 Units Subcutaneous Q8H  . hydrALAZINE  50 mg Oral Q8H  . ipratropium-albuterol  3 mL Nebulization TID  . methylPREDNISolone (SOLU-MEDROL) injection  40 mg Intravenous Q12H  . metoprolol tartrate  75 mg Oral BID  . mometasone-formoterol  2 puff Inhalation BID  . nicotine  14 mg Transdermal Daily  . pantoprazole  40 mg Oral BID AC  . pravastatin  40 mg Oral q1800  . sodium chloride flush  3 mL Intravenous Q12H   . sodium chloride    . sodium chloride    . cefTRIAXone (ROCEPHIN)  IV Stopped (06/02/17 1700)   sodium chloride, sodium chloride, acetaminophen **OR** acetaminophen, alteplase, alteplase, bisacodyl, calcium acetate, diphenhydrAMINE, heparin, heparin, heparin, hydrALAZINE, HYDROcodone-acetaminophen, ipratropium-albuterol, lidocaine (PF), lidocaine-prilocaine, ondansetron **OR** ondansetron (ZOFRAN) IV, pentafluoroprop-tetrafluoroeth, senna-docusate  Exam: Alert, nasal O2 No jvd Chest mostly clear bilat RRR nomrg Abd soft ntnd obese Ext trace-1+ edema bilat   Dialysis: MWF [facility] 4h 60min   98kg   2/2  bath  Hep 4000  LFA AVF -calcitriol 1.5 ug -mircera 100 ug last 5/1, due 5/15      Impression: 1  Bilat lower lobe PNA - per CT, no edema per cxr/ CT on admit 2  ESRD cont mwf hd 3  HTN / vol bp's up some edema try to lower edw while here 4  Anemia ckd next esa due 5/15 5  COPD/ tobacco on nicotine patch 6  MBD ckd cont vdra/ binders  Plan - HD today max UF as Ronnie Derby MD [facility] pager [phone]   06/03/2017, 8:34 AM   Recent Labs  Lab 05/31/17 0313 06/01/17 0350 06/02/17 0725 06/03/17 0335  NA 138 136 141 141  K 4.3 4.7 4.1 4.6  CL 99* 97* 101 98*  CO2 26 24 27 27   GLUCOSE 109* 220* 128* 201*  BUN 25* 54* 41* 63*  CREATININE 8.51* 12.06* 10.16* 12.24*  CALCIUM 8.6* 9.2 9.5 9.6  PHOS 5.3*  --   --   --    Recent Labs  Lab 05/30/17 1448 05/31/17 0313  AST 21  --   ALT 22  --   ALKPHOS 55  --   BILITOT 0.8  --   PROT 6.7  --   ALBUMIN 3.5 3.4*   Recent Labs  Lab 05/31/17 0313 06/01/17 0350 06/02/17 0725  WBC 4.6 4.2 9.3  NEUTROABS 3.8 3.7 7.9*  HGB 9.1* 9.0* 9.5*  HCT 28.7* 27.7* 29.5*  MCV 97.6 96.2 96.7  PLT 113* 106* 131*   Iron/TIBC/Ferritin/ %Sat    Component Value Date/Time   IRON 90 01/19/2010 1016   TIBC 225 01/19/2010 1016   FERRITIN 318 01/19/2010 1016   IRONPCTSAT 40 01/19/2010 1016

## 2017-06-03 NOTE — Plan of Care (Signed)
Care plans reviewed, patient is progressing.

## 2017-06-04 LAB — CBC WITH DIFFERENTIAL/PLATELET
BASOS PCT: 1 %
Basophils Absolute: 0.1 10*3/uL (ref 0.0–0.1)
EOS ABS: 0.1 10*3/uL (ref 0.0–0.7)
Eosinophils Relative: 1 %
HCT: 28.3 % — ABNORMAL LOW (ref 39.0–52.0)
Hemoglobin: 9.3 g/dL — ABNORMAL LOW (ref 13.0–17.0)
Lymphocytes Relative: 20 %
Lymphs Abs: 1.5 10*3/uL (ref 0.7–4.0)
MCH: 31.8 pg (ref 26.0–34.0)
MCHC: 32.9 g/dL (ref 30.0–36.0)
MCV: 96.9 fL (ref 78.0–100.0)
MONO ABS: 0.8 10*3/uL (ref 0.1–1.0)
Monocytes Relative: 10 %
NEUTROS ABS: 5.1 10*3/uL (ref 1.7–7.7)
Neutrophils Relative %: 68 %
Platelets: 104 10*3/uL — ABNORMAL LOW (ref 150–400)
RBC: 2.92 MIL/uL — ABNORMAL LOW (ref 4.22–5.81)
RDW: 13.8 % (ref 11.5–15.5)
WBC: 7.6 10*3/uL (ref 4.0–10.5)

## 2017-06-04 LAB — GLUCOSE, CAPILLARY
Glucose-Capillary: 183 mg/dL — ABNORMAL HIGH (ref 65–99)
Glucose-Capillary: 208 mg/dL — ABNORMAL HIGH (ref 65–99)

## 2017-06-04 LAB — BASIC METABOLIC PANEL
Anion gap: 14 (ref 5–15)
BUN: 38 mg/dL — ABNORMAL HIGH (ref 6–20)
CALCIUM: 9 mg/dL (ref 8.9–10.3)
CO2: 26 mmol/L (ref 22–32)
CREATININE: 8.27 mg/dL — AB (ref 0.61–1.24)
Chloride: 100 mmol/L — ABNORMAL LOW (ref 101–111)
GFR calc Af Amer: 7 mL/min — ABNORMAL LOW (ref >60)
GFR calc non Af Amer: 6 mL/min — ABNORMAL LOW (ref >60)
GLUCOSE: 130 mg/dL — AB (ref 65–99)
Potassium: 3.3 mmol/L — ABNORMAL LOW (ref 3.5–5.1)
Sodium: 140 mmol/L (ref 135–145)

## 2017-06-04 MED ORDER — GUAIFENESIN-DM 100-10 MG/5ML PO SYRP
10.0000 mL | ORAL_SOLUTION | ORAL | Status: DC
  Administered 2017-06-04 – 2017-06-05 (×5): 10 mL via ORAL
  Filled 2017-06-04 (×5): qty 10

## 2017-06-04 NOTE — Progress Notes (Signed)
RT NOTE: 1200 Chest PT held. Patient sleeping

## 2017-06-04 NOTE — Progress Notes (Signed)
Physical Therapy Treatment Patient Details Name: Kirk [name] MRN: [id number] DOB: 11-03-1960 Today's Date: 06/04/2017    History of Present Illness Pt is a 57 y.o. male admitted 05/30/17 with acute respiratory distress; worked up for HCAP. PMH includes ESRD on HD, chronic pancytopenia, HTN, anxiety and obstructive sleep apnea. Of note, recent hospitalization for pneumonia about a month ago.    PT Comments    Pt has declined OOB mobility 2x today secondary to fatigue/sleeping; RN reports recent change in status with bilat knee buckling upon attempting to stand and increased lethargy, although VSS. Pt agreeable to mobilize with PT. Reliant on momentum to stand with c/o bilat knee "weakness". Stood 3x total with close min guard, each trial with BLE shaking, bilat knee buckling, and uncontrolled descent to bed; pt unable to flex/extend knees or flex hips against resistance with MMT testing. Pt with poor awareness, stating he can walk fine although clearly falling onto bed when attempting to march in place. Cues to open eyes throughout session. Further ambulation deferred this session. Will continue to follow close to ensure pt safety with discharge plan and any further change in status.   Follow Up Recommendations  No PT follow up;Supervision for mobility/OOB     Equipment Recommendations  None recommended by PT    Recommendations for Other Services       Precautions / Restrictions Precautions Precautions: Fall Restrictions Weight Bearing Restrictions: No    Mobility  Bed Mobility Overal bed mobility: Modified Independent                Transfers Overall transfer level: Needs assistance   Transfers: Sit to/from Stand Sit to Stand: Min guard         General transfer comment: Able to stand on 3rd attempt, reliant on momentum; min guard for balance. Uncontrolled descent to bed due to bilat knee instability. Stood 2x more with single UE support; bilat knees buckling  throughout. Pt refusing use of RW stating he does not need it. Eyes closed throughout requiring cues to open  Ambulation/Gait             General Gait Details: Bilat knee buckling when attempting to march in place. Ambulation deferred secondary to this and pt's decreased awareness/lethargy   Stairs             Wheelchair Mobility    Modified Rankin (Stroke Patients Only)       Balance Overall balance assessment: Needs assistance   Sitting balance-Leahy Scale: Good       Standing balance-Leahy Scale: Fair                              Cognition Arousal/Alertness: Lethargic;Suspect due to medications Behavior During Therapy: Flat affect Overall Cognitive Status: Impaired/Different from baseline Area of Impairment: Safety/judgement;Memory;Awareness;Attention                   Current Attention Level: Sustained Memory: Decreased short-term memory   Safety/Judgement: Decreased awareness of safety;Decreased awareness of deficits Awareness: Emergent   General Comments: Pt still saying he can walk just fine although BLEs giving out and falling back onto bed. Unsure if he has taken any meds today; did not realize RN had just placed PIV      Exercises      General Comments        Pertinent Vitals/Pain Pain Assessment: Faces Faces Pain Scale: Hurts little more Pain Location: BLEs, lower back  Pain Descriptors / Indicators: Sore;Discomfort Pain Intervention(s): Monitored during session    Home Living                      Prior Function            PT Goals (current goals can now be found in the care plan section) Acute Rehab PT Goals Patient Stated Goal: to return to baseline PT Goal Formulation: With patient Time For Goal Achievement: 06/10/17 Potential to Achieve Goals: Good Progress towards PT goals: Not progressing toward goals - comment(Change in function)    Frequency    Min 3X/week      PT Plan Current plan  remains appropriate    Co-evaluation              AM-PAC PT "6 Clicks" Daily Activity  Outcome Measure  Difficulty turning over in bed (including adjusting bedclothes, sheets and blankets)?: None Difficulty moving from lying on back to sitting on the side of the bed? : None Difficulty sitting down on and standing up from a chair with arms (e.g., wheelchair, bedside commode, etc,.)?: A Little Help needed moving to and from a bed to chair (including a wheelchair)?: A Little Help needed walking in hospital room?: A Lot Help needed climbing 3-5 steps with a railing? : A Lot 6 Click Score: 18    End of Session Equipment Utilized During Treatment: Gait belt Activity Tolerance: Patient limited by fatigue Patient left: in bed;with call bell/phone within reach;with bed alarm set Nurse Communication: Mobility status PT Visit Diagnosis: Unsteadiness on feet (R26.81);Other abnormalities of gait and mobility (R26.89)     Time: 6283-6629 PT Time Calculation (min) (ACUTE ONLY): 12 min  Charges:  $Therapeutic Activity: 8-22 mins                    G Codes:      Mabeline Caras, PT, DPT Acute Rehab Services  Pager: [place] 06/04/2017, 5:15 PM

## 2017-06-04 NOTE — Progress Notes (Signed)
PROGRESS NOTE    Kirk [name]  FKC:[id number] DOB: July 13, 1960 DOA: 05/30/2017 PCP: Gayland Curry, DO    Brief Narrative:  57 year old male who presented with acute respiratory distress. He does have thesignificant past medical history of end-stage renal disease on hemodialysis, chronic pancytopenia, hypertension, anxiety andobstructive sleep apnea.Recent hospitalization for pneumonia about a month ago. After his hospitalization in terms of his dyspnea he never did return to his baseline.On the day of admission was noted in severe respiratory distress by his primary care physician.On the initial physical examination he was noted in respiratory distress, he was placed on noninvasive mechanical ventilation, his blood pressure was 164/85, heart rate 74, respiratory rate26 and oxygen saturation 100% on BiPAP.Patient was in distress, he had decreased breath sounds bilaterally with rhonchi, heart S1-S2 present, rhythmic, abdomen soft nontender, trace lower extremity edema bilaterally.Sodium 140, potassium 4.0, chloride 99, bicarb 27, glucose 130, BUN 30, creatinine 11.3, white count 2.5, hemoglobin 9.3, hematocrit 28.7, platelets 103.His venous pH was 7.48.Chest x-ray with mild cardiomegaly, increased lung markings bilaterally, bibasilar atelectasis. CT chest negative for pulmonary embolism, bibasilar infiltrates. EKG sinus rhythm, normal axis, normal intervals, LVH with repolarization changes.  Patient was admitted to the hospital with a working diagnosis of acute hypoxic respiratory failure due to bilateral lower lobe pneumonia   Assessment & Plan:   Principal Problem:   HCAP (healthcare-associated pneumonia) Active Problems:   OSA on CPAP   HTN (hypertension)   Pancytopenia (HCC)   Obesity hypoventilation syndrome (HCC)   Acute respiratory distress   ESRD (end stage renal disease) ([place])   Anxiety  1.Acute hypoxic respiratory failure due to bilateral lower  lobespneumonia complicated by COPD exacerbation.Antibiotic therapy  #4/8 ceftriaxone and azithromycin. Very slowly responding to aggressive bronchodilator therapy with b2 agonist-anticholinergic and inhaled corticosterids. Continue methylprednisolone to 40 mg daily. Continue hypertonic saline, flutter valve and chest PT, antitussive therapy with scheduled guaifenesin dm. Patient very deconditioned, will check ambulatory oxymetry on room air.  2.End-stage renal disease on hemodialysis. Clinically euvolemic, will continue HD per nephrology recommendations, will  continue sensipar,calcitriol and phoslo.  3.Pancytopenia. Stable cell counts with wbc at 7,6  with hb 9,3  and plt at 104. Continue to follow cell count in am. Continue darbepoetin on HD  4.Obstructive sleep apnea. ContinueC-pap for sleep and at night.   5.Anxiety. Continue withclonazepam.  6. Dyslipidemia.Pravastatin 40 mg daily with good toleration.    7. HTN.Continue metoprolol 75 mg po bid and hydralazine 50 mg tid. Systolic 749-449 mmHg.   DVT prophylaxis:heparin Code Status:Full  Family Communication:I spoke with patient's wife at the bedside and all questions were addressed.  Disposition Plan:plan for discharge in am if continue to improve   Consultants:  Nephrology  Procedures:    Antimicrobials:  Azithromycin   Ceftriaxone.  Subjective: Patient complains of persistent dyspnea and cough, he was not able to sleep last night and this am is not feeling well. No nausea or vomiting, no chest pain. Positive weakness.   Objective: Vitals:   06/03/17 1935 06/03/17 2030 06/03/17 2307 06/04/17 0531  BP: (!) 169/73     Pulse: 86  84   Resp: 20  18   Temp: 98.9 F (37.2 C)     TempSrc:      SpO2: 95% 91% 95%   Weight:    98.3 kg (216 lb 11.4 oz)  Height:        Intake/Output Summary (Last 24 hours) at 06/04/2017 0905 Last data filed at 06/04/2017 0730 Gross per  24 hour   Intake 580 ml  Output 3000 ml  Net -2420 ml   Filed Weights   06/03/17 0757 06/03/17 1210 06/04/17 0531  Weight: 100.7 kg (222 lb 0.1 oz) 98.1 kg (216 lb 4.3 oz) 98.3 kg (216 lb 11.4 oz)    Examination:   General: deconditioned  Neurology: Awake and alert, non focal  E ENT: no pallor, no icterus, oral mucosa moist Cardiovascular: No JVD. S1-S2 present, rhythmic, no gallops, rubs, or murmurs. No lower extremity edema. Pulmonary: positive breath sounds bilaterally, decreased air movement, positive expiratory wheezing, with no rhonchi or rales. Gastrointestinal. Abdomen with no organomegaly, non tender, no rebound or guarding Skin. No rashes Musculoskeletal: no joint deformities     Data Reviewed: I have personally reviewed following labs and imaging studies  CBC: Recent Labs  Lab 05/30/17 1448 05/31/17 0313 06/01/17 0350 06/02/17 0725 06/03/17 0815 06/04/17 0347  WBC 2.5* 4.6 4.2 9.3 9.4 7.6  NEUTROABS 1.4* 3.8 3.7 7.9*  --  5.1  HGB 9.3* 9.1* 9.0* 9.5* 8.7* 9.3*  HCT 28.7* 28.7* 27.7* 29.5* 26.3* 28.3*  MCV 98.0 97.6 96.2 96.7 94.6 96.9  PLT 103* 113* 106* 131* 107* 798*   Basic Metabolic Panel: Recent Labs  Lab 05/31/17 0313 06/01/17 0350 06/02/17 0725 06/03/17 0335 06/04/17 0347  NA 138 136 141 141 140  K 4.3 4.7 4.1 4.6 3.3*  CL 99* 97* 101 98* 100*  CO2 26 24 27 27 26   GLUCOSE 109* 220* 128* 201* 130*  BUN 25* 54* 41* 63* 38*  CREATININE 8.51* 12.06* 10.16* 12.24* 8.27*  CALCIUM 8.6* 9.2 9.5 9.6 9.0  PHOS 5.3*  --   --   --   --    GFR: Estimated Creatinine Clearance: 11.3 mL/min (A) (by C-G formula based on SCr of 8.27 mg/dL (H)). Liver Function Tests: Recent Labs  Lab 05/30/17 1448 05/31/17 0313  AST 21  --   ALT 22  --   ALKPHOS 55  --   BILITOT 0.8  --   PROT 6.7  --   ALBUMIN 3.5 3.4*   No results for input(s): LIPASE, AMYLASE in the last 168 hours. No results for input(s): AMMONIA in the last 168 hours. Coagulation Profile: No  results for input(s): INR, PROTIME in the last 168 hours. Cardiac Enzymes: No results for input(s): CKTOTAL, CKMB, CKMBINDEX, TROPONINI in the last 168 hours. BNP (last 3 results) No results for input(s): PROBNP in the last 8760 hours. HbA1C: No results for input(s): HGBA1C in the last 72 hours. CBG: Recent Labs  Lab 06/01/17 0551 06/02/17 0640 06/03/17 0620 06/03/17 1256 06/04/17 0625  GLUCAP 214* 134* 151* 206* 183*   Lipid Profile: No results for input(s): CHOL, HDL, LDLCALC, TRIG, CHOLHDL, LDLDIRECT in the last 72 hours. Thyroid Function Tests: No results for input(s): TSH, T4TOTAL, FREET4, T3FREE, THYROIDAB in the last 72 hours. Anemia Panel: No results for input(s): VITAMINB12, FOLATE, FERRITIN, TIBC, IRON, RETICCTPCT in the last 72 hours.    Radiology Studies: I have reviewed all of the imaging during this hospital visit personally     Scheduled Meds: . aspirin EC  81 mg Oral Daily  . azithromycin  500 mg Oral Daily  . calcitRIOL  1.25 mcg Oral Q M,W,F-HD  . calcium acetate  5,336 mg Oral TID WC  . cholecalciferol  5,000 Units Oral Daily  . cinacalcet  30 mg Oral Q supper  . clonazePAM  0.5 mg Oral BID  . darbepoetin (ARANESP) injection - DIALYSIS  100 mcg Intravenous Q Mon-HD  . guaiFENesin-dextromethorphan  10 mL Oral Q6H  . heparin  5,000 Units Subcutaneous Q8H  . hydrALAZINE  50 mg Oral Q8H  . ipratropium-albuterol  3 mL Nebulization TID  . methylPREDNISolone (SOLU-MEDROL) injection  40 mg Intravenous Daily  . metoprolol tartrate  75 mg Oral BID  . mometasone-formoterol  2 puff Inhalation BID  . nicotine  14 mg Transdermal Daily  . pantoprazole  40 mg Oral BID AC  . pravastatin  40 mg Oral q1800  . sodium chloride flush  3 mL Intravenous Q12H   Continuous Infusions: . cefTRIAXone (ROCEPHIN)  IV Stopped (06/03/17 1747)     LOS: 5 days        Mauricio Gerome Apley, MD [facility] Pager [phone]

## 2017-06-04 NOTE — Progress Notes (Addendum)
[facility] Progress Note  Subjective: wife at bedside, says his oxygen levels were "falling when he walked ". Says he was diagnosed with asthma by lung specialist 2 yrs ago, but no hx copd.    Vitals:   06/03/17 2307 06/04/17 0531 06/04/17 0937 06/04/17 0938  BP:      Pulse: 84     Resp: 18     Temp:      TempSrc:      SpO2: 95%  96% 96%  Weight:  98.3 kg (216 lb 11.4 oz)    Height:        Inpatient medications: . aspirin EC  81 mg Oral Daily  . azithromycin  500 mg Oral Daily  . calcitRIOL  1.25 mcg Oral Q M,W,F-HD  . calcium acetate  5,336 mg Oral TID WC  . cholecalciferol  5,000 Units Oral Daily  . cinacalcet  30 mg Oral Q supper  . clonazePAM  0.5 mg Oral BID  . darbepoetin (ARANESP) injection - DIALYSIS  100 mcg Intravenous Q Mon-HD  . guaiFENesin-dextromethorphan  10 mL Oral Q4H  . heparin  5,000 Units Subcutaneous Q8H  . hydrALAZINE  50 mg Oral Q8H  . ipratropium-albuterol  3 mL Nebulization TID  . methylPREDNISolone (SOLU-MEDROL) injection  40 mg Intravenous Daily  . metoprolol tartrate  75 mg Oral BID  . mometasone-formoterol  2 puff Inhalation BID  . nicotine  14 mg Transdermal Daily  . pantoprazole  40 mg Oral BID AC  . pravastatin  40 mg Oral q1800  . sodium chloride flush  3 mL Intravenous Q12H   . cefTRIAXone (ROCEPHIN)  IV Stopped (06/03/17 1747)   acetaminophen **OR** acetaminophen, bisacodyl, calcium acetate, diphenhydrAMINE, hydrALAZINE, HYDROcodone-acetaminophen, ipratropium-albuterol, ondansetron **OR** ondansetron (ZOFRAN) IV, senna-docusate  Exam: Alert, on cpap mask No jvd Chest diffuse coarse wheezing throughout the lung fields No rales or bronchial bs RRR no mrg Abd soft ntnd obese Ext trace-1+ edema bilat   Dialysis: MWF [facility] 4h 64min   98kg   2/2 bath  Hep 4000  LFA AVF -calcitriol 1.5 ug -mircera 100 ug last 5/1, due 5/15      Impression: 1  Bilat lower lobe PNA - still having hypoxemia episodes, sig wheezing today;  has rec'd some IV steroids while here and also IV abx.  Have d/w primary.  2  ESRD cont mwf hd 3  HTN / vol - is at dry wt, no vol excess by exam or by CXR/ CT chest. May be able to lower edw some w hd tomorrow. Cont hydral/ metoprolol.  4  Anemia ckd next esa due 5/15 5  COPD/ tobacco on nicotine patch 6  MBD ckd cont vdra/ binders  Plan - HD Gretta Arab MD [facility] pager [phone]   06/04/2017, 11:21 AM   Recent Labs  Lab 05/31/17 0313  06/02/17 0725 06/03/17 0335 06/04/17 0347  NA 138   < > 141 141 140  K 4.3   < > 4.1 4.6 3.3*  CL 99*   < > 101 98* 100*  CO2 26   < > 27 27 26   GLUCOSE 109*   < > 128* 201* 130*  BUN 25*   < > 41* 63* 38*  CREATININE 8.51*   < > 10.16* 12.24* 8.27*  CALCIUM 8.6*   < > 9.5 9.6 9.0  PHOS 5.3*  --   --   --   --    < > = values  in this interval not displayed.   Recent Labs  Lab 05/30/17 1448 05/31/17 0313  AST 21  --   ALT 22  --   ALKPHOS 55  --   BILITOT 0.8  --   PROT 6.7  --   ALBUMIN 3.5 3.4*   Recent Labs  Lab 06/01/17 0350 06/02/17 0725 06/03/17 0815 06/04/17 0347  WBC 4.2 9.3 9.4 7.6  NEUTROABS 3.7 7.9*  --  5.1  HGB 9.0* 9.5* 8.7* 9.3*  HCT 27.7* 29.5* 26.3* 28.3*  MCV 96.2 96.7 94.6 96.9  PLT 106* 131* 107* 104*   Iron/TIBC/Ferritin/ %Sat    Component Value Date/Time   IRON 90 01/19/2010 1016   TIBC 225 01/19/2010 1016   FERRITIN 318 01/19/2010 1016   IRONPCTSAT 40 01/19/2010 1016

## 2017-06-04 NOTE — Progress Notes (Signed)
Patient noted to be more awake and alert at this time.  Taking meds and eating dinner.  Still weak and wobbly on his feet.  Bed alarm on. Vital signs stable.  Will continue to monitor.

## 2017-06-04 NOTE — Plan of Care (Signed)
  Problem: Health Behavior/Discharge Planning: Goal: Ability to manage health-related needs will improve Outcome: Progressing   Problem: Clinical Measurements: Goal: Ability to maintain clinical measurements within normal limits will improve Outcome: Progressing Goal: Cardiovascular complication will be avoided Outcome: Progressing   Problem: Nutrition: Goal: Adequate nutrition will be maintained Outcome: Progressing   Problem: Education: Goal: Knowledge of General Education information will improve Outcome: Completed/Met   Problem: Clinical Measurements: Goal: Will remain free from infection Outcome: Completed/Met   Problem: Safety: Goal: Ability to remain free from injury will improve Outcome: Completed/Met   Problem: Skin Integrity: Goal: Risk for impaired skin integrity will decrease Outcome: Completed/Met

## 2017-06-04 NOTE — Progress Notes (Signed)
RT set up patient CPAP HS. 2L O2 bleed in needed. Patient tolerating well.

## 2017-06-04 NOTE — Progress Notes (Signed)
RT called to room to assess patient. She stated he was more lethargic than before. He is arousable and able to answer all questions when asked, but does appear to be very sleepy. She stated she found him a short while ago wearing his CPAP and she was not sure that it was on. She took off the CPAP and placed back on 2L. Patient stated he would like to wear his CPAP at this time. Placed patient on CPAP of 14. RA. SAT 98% with slight wheezes. RN paging MD.

## 2017-06-04 NOTE — Progress Notes (Signed)
On entering room, patient noted to be sleeping soundly.  Patient slightly difficult to arouse but would awaken and state name, place and date with prompting before falling right back to sleep.  Patient was unable/unwilling to sit up or stay awake. MD text paged. RT and SWOT nurse entered room and assessed patient.  RT placed patient on his CPAP mask.  Patient was unable to stand without legs buckling underneath him.  This was all new for patient.  Patient had been awake, talking and ambulating in hallway earlier in the am. Vital signs stable.  Will continue to monitor.

## 2017-06-04 NOTE — Progress Notes (Signed)
Patient has been noncompliant with diet and fl. Restrictions. Wife brought him Hot Dog to eat and gave him Hot Choc.  To drink.

## 2017-06-04 NOTE — Progress Notes (Signed)
RT NOTE: Pt refuses chest PT. Wants to continue sleeping.

## 2017-06-04 NOTE — Care Management Important Message (Signed)
Important Message  Patient Details  Name: Kirk [name] MRN: [id number] Date of Birth: Mar 27, 1960   Medicare Important Message Given:  Yes    Harrietta Incorvaia P Starlee Corralejo 06/04/2017, 1:47 PM

## 2017-06-05 DIAGNOSIS — R0603 Acute respiratory distress: Secondary | ICD-10-CM

## 2017-06-05 DIAGNOSIS — J189 Pneumonia, unspecified organism: Secondary | ICD-10-CM

## 2017-06-05 DIAGNOSIS — I10 Essential (primary) hypertension: Secondary | ICD-10-CM

## 2017-06-05 DIAGNOSIS — Z9989 Dependence on other enabling machines and devices: Secondary | ICD-10-CM

## 2017-06-05 DIAGNOSIS — N186 End stage renal disease: Secondary | ICD-10-CM

## 2017-06-05 DIAGNOSIS — E662 Morbid (severe) obesity with alveolar hypoventilation: Secondary | ICD-10-CM

## 2017-06-05 DIAGNOSIS — D61818 Other pancytopenia: Secondary | ICD-10-CM

## 2017-06-05 DIAGNOSIS — F419 Anxiety disorder, unspecified: Secondary | ICD-10-CM

## 2017-06-05 DIAGNOSIS — G4733 Obstructive sleep apnea (adult) (pediatric): Secondary | ICD-10-CM

## 2017-06-05 LAB — CBC WITH DIFFERENTIAL/PLATELET
ABS IMMATURE GRANULOCYTES: 0.2 10*3/uL — AB (ref 0.0–0.1)
Basophils Absolute: 0 10*3/uL (ref 0.0–0.1)
Basophils Relative: 1 %
EOS ABS: 0 10*3/uL (ref 0.0–0.7)
Eosinophils Relative: 1 %
HEMATOCRIT: 26.7 % — AB (ref 39.0–52.0)
Hemoglobin: 9 g/dL — ABNORMAL LOW (ref 13.0–17.0)
IMMATURE GRANULOCYTES: 2 %
LYMPHS ABS: 1.2 10*3/uL (ref 0.7–4.0)
Lymphocytes Relative: 14 %
MCH: 31.8 pg (ref 26.0–34.0)
MCHC: 33.7 g/dL (ref 30.0–36.0)
MCV: 94.3 fL (ref 78.0–100.0)
MONOS PCT: 9 %
Monocytes Absolute: 0.8 10*3/uL (ref 0.1–1.0)
NEUTROS PCT: 73 %
Neutro Abs: 6.4 10*3/uL (ref 1.7–7.7)
Platelets: 103 10*3/uL — ABNORMAL LOW (ref 150–400)
RBC: 2.83 MIL/uL — ABNORMAL LOW (ref 4.22–5.81)
RDW: 13.2 % (ref 11.5–15.5)
WBC: 8.7 10*3/uL (ref 4.0–10.5)

## 2017-06-05 LAB — BASIC METABOLIC PANEL
ANION GAP: 13 (ref 5–15)
BUN: 53 mg/dL — ABNORMAL HIGH (ref 6–20)
CO2: 28 mmol/L (ref 22–32)
Calcium: 10 mg/dL (ref 8.9–10.3)
Chloride: 99 mmol/L — ABNORMAL LOW (ref 101–111)
Creatinine, Ser: 11.5 mg/dL — ABNORMAL HIGH (ref 0.61–1.24)
GFR calc Af Amer: 5 mL/min — ABNORMAL LOW (ref >60)
GFR calc non Af Amer: 4 mL/min — ABNORMAL LOW (ref >60)
GLUCOSE: 114 mg/dL — AB (ref 65–99)
POTASSIUM: 3.8 mmol/L (ref 3.5–5.1)
Sodium: 140 mmol/L (ref 135–145)

## 2017-06-05 LAB — CULTURE, BLOOD (ROUTINE X 2)
Culture: NO GROWTH
Special Requests: ADEQUATE

## 2017-06-05 LAB — GLUCOSE, CAPILLARY: GLUCOSE-CAPILLARY: 167 mg/dL — AB (ref 65–99)

## 2017-06-05 MED ORDER — HEPARIN SODIUM (PORCINE) 1000 UNIT/ML DIALYSIS: 4000.0000 [IU] | Freq: Once | INTRAMUSCULAR | Status: DC

## 2017-06-05 MED ORDER — CALCITRIOL 0.25 MCG PO CAPS
ORAL_CAPSULE | ORAL | Status: AC
  Administered 2017-06-05: 1.25 ug via ORAL
  Filled 2017-06-05: qty 1

## 2017-06-05 MED ORDER — CEFTRIAXONE SODIUM 1 G IJ SOLR
1.0000 g | Freq: Once | INTRAMUSCULAR | Status: AC
  Administered 2017-06-05: 1 g via INTRAMUSCULAR
  Filled 2017-06-05: qty 10

## 2017-06-05 MED ORDER — LIDOCAINE HCL (PF) 1 % IJ SOLN
INTRAMUSCULAR | Status: AC
  Administered 2017-06-05: 2.1 mL
  Filled 2017-06-05: qty 5

## 2017-06-05 MED ORDER — PREDNISONE 20 MG PO TABS: ORAL_TABLET | ORAL | 0 refills | Status: DC

## 2017-06-05 MED ORDER — CALCITRIOL 0.5 MCG PO CAPS
ORAL_CAPSULE | ORAL | Status: AC
  Administered 2017-06-05: 1.25 ug via ORAL
  Filled 2017-06-05: qty 2

## 2017-06-05 MED ORDER — ALBUTEROL SULFATE HFA 108 (90 BASE) MCG/ACT IN AERS: 2.0000 | INHALATION_SPRAY | Freq: Four times a day (QID) | RESPIRATORY_TRACT | 0 refills | Status: DC | PRN

## 2017-06-05 NOTE — Progress Notes (Addendum)
[facility] Progress Note   Subjective: Seen on HD. Tolerating well. Agrees to challenge 0.5 kg. Denies SOB.   Objective Vitals:   06/05/17 0607 06/05/17 0828 06/05/17 0845 06/05/17 0846  BP: (!) 185/77 (!) 176/77 (!) 171/78 (!) 188/82  Pulse:  84 87 88  Resp:   18   Temp:  98.5 F (36.9 C)    TempSrc:  Oral Oral   SpO2:  96% 96%   Weight:   100.7 kg (222 lb 0.1 oz)   Height:       Physical Exam General:WNWD Heart: S1,S2 RRR Lungs: Coarse scattered rhonchi predominately RML/RUL. No WOB Abdomen: Active BS Extremities: NO LE edema Dialysis Access: LFA AVF cannulated at present.   Additional Objective Labs: Basic Metabolic Panel: Recent Labs  Lab 05/31/17 0313  06/03/17 0335 06/04/17 0347 06/05/17 0346  NA 138   < > 141 140 140  K 4.3   < > 4.6 3.3* 3.8  CL 99*   < > 98* 100* 99*  CO2 26   < > 27 26 28   GLUCOSE 109*   < > 201* 130* 114*  BUN 25*   < > 63* 38* 53*  CREATININE 8.51*   < > 12.24* 8.27* 11.50*  CALCIUM 8.6*   < > 9.6 9.0 10.0  PHOS 5.3*  --   --   --   --    < > = values in this interval not displayed.   Liver Function Tests: Recent Labs  Lab 05/30/17 1448 05/31/17 0313  AST 21  --   ALT 22  --   ALKPHOS 55  --   BILITOT 0.8  --   PROT 6.7  --   ALBUMIN 3.5 3.4*   No results for input(s): LIPASE, AMYLASE in the last 168 hours. CBC: Recent Labs  Lab 06/01/17 0350 06/02/17 0725 06/03/17 0815 06/04/17 0347 06/05/17 0346  WBC 4.2 9.3 9.4 7.6 8.7  NEUTROABS 3.7 7.9*  --  5.1 6.4  HGB 9.0* 9.5* 8.7* 9.3* 9.0*  HCT 27.7* 29.5* 26.3* 28.3* 26.7*  MCV 96.2 96.7 94.6 96.9 94.3  PLT 106* 131* 107* 104* 103*   Blood Culture    Component Value Date/Time   SDES EXPECTORATED SPUTUM 05/31/2017 1816   SDES EXPECTORATED SPUTUM 05/31/2017 1816   SPECREQUEST NONE 05/31/2017 1816   SPECREQUEST NONE Reflexed from [id number] 05/31/2017 1816   CULT  05/31/2017 1816    Consistent with normal respiratory flora. Performed at [facility], [place] [address] [place], [place] [address]    REPTSTATUS 06/01/2017 FINAL 05/31/2017 1816   REPTSTATUS 06/03/2017 FINAL 05/31/2017 1816    Cardiac Enzymes: No results for input(s): CKTOTAL, CKMB, CKMBINDEX, TROPONINI in the last 168 hours. CBG: Recent Labs  Lab 06/03/17 0620 06/03/17 1256 06/04/17 0625 06/04/17 1459 06/05/17 0606  GLUCAP 151* 206* 183* 208* 167*   Iron Studies: No results for input(s): IRON, TIBC, TRANSFERRIN, FERRITIN in the last 72 hours. @lablastinr3 @ Studies/Results: No results found. Medications: . cefTRIAXone (ROCEPHIN)  IV Stopped (06/04/17 1730)   . aspirin EC  81 mg Oral Daily  . azithromycin  500 mg Oral Daily  . calcitRIOL  1.25 mcg Oral Q M,W,F-HD  . calcium acetate  5,336 mg Oral TID WC  . cholecalciferol  5,000 Units Oral Daily  . cinacalcet  30 mg Oral Q supper  . clonazePAM  0.5 mg Oral BID  . darbepoetin (ARANESP) injection - DIALYSIS  100 mcg Intravenous Q Mon-HD  .  guaiFENesin-dextromethorphan  10 mL Oral Q4H  . [START ON 06/06/2017] heparin  4,000 Units Dialysis Once in dialysis  . heparin  5,000 Units Subcutaneous Q8H  . hydrALAZINE  50 mg Oral Q8H  . ipratropium-albuterol  3 mL Nebulization TID  . methylPREDNISolone (SOLU-MEDROL) injection  40 mg Intravenous Daily  . metoprolol tartrate  75 mg Oral BID  . mometasone-formoterol  2 puff Inhalation BID  . nicotine  14 mg Transdermal Daily  . pantoprazole  40 mg Oral BID AC  . pravastatin  40 mg Oral q1800  . sodium chloride flush  3 mL Intravenous Q12H   Dialysis Orders: Center: [facility]   on MWF . EDW 98 kgHD Bath 2k, 2ca  Time 4hr 36min Heparin 4000. Access LFA AVG Calcitriol 1.5 mcg po / hd   Mircerfa 100 mcg (last given 75 mcg 05/22/17)   Assessment/Plan: 1. Acute Hypoxic Resp Failure/Bilateral Lobar PNA with COPD Exacerbation: Per primary. Continues on Azithromycin/Ceftriaxone. BC NG 4 days.  2. ESRD -HD today on schedule. K+ 3.8. On 4.0 K bath.  3. Anemia -HGB 9.0  Rec'd Arnesp 100 mcg IV 06/03/2017. Follow trend.  4. Secondary hyperparathyroidism -  5. HTN/volume - On HD challenging 0.5 kg UFG set to 4.0. Pre wt 100.7  6. Nutrition - Non-compliance with Na restrictions. Will have dietician at OP unit see pt. Renal Diet with fld restrictions. Nepro/renal vits.    Kirk H. Brown NP-C 06/05/2017, 9:22 AM  [facility] [phone]  Pt seen, examined and agree w A/P as above.  Kelly Splinter MD [employer] pager [phone]   06/05/2017, 12:53 PM

## 2017-06-05 NOTE — Progress Notes (Signed)
PT Cancellation Note  Patient Details Name: RICARDO SCHUBACH MRN: [id number] DOB: 05-10-1960   Cancelled Treatment:    Reason Eval/Treat Not Completed: Patient at procedure or test/unavailable(Pt in HD.  Will check back as able.  )   Denice Paradise 06/05/2017, 12:58 PM  [facility] [phone] (pager)

## 2017-06-05 NOTE — Discharge Summary (Signed)
Physician Discharge Summary  Kirk [name] YSA:[id number] DOB: [birth year] DOA: 05/30/2017  PCP: Gayland Curry, DO  Admit date: 05/30/2017 Discharge date: 06/05/2017  Admitted From: Home Disposition: Home   Recommendations for Outpatient Follow-up:  1. Follow up with PCP in 1-2 weeks 2. Follow up with pulmonology.  Home Health: None Equipment/Devices: None Discharge Condition: Stable CODE STATUS: Full Diet recommendation: Renal, heart healthy  Brief/Interim Summary: 57 year old male who presented with acute respiratory distress. He does have thesignificant past medical history of end-stage renal disease on hemodialysis, chronic pancytopenia, hypertension, anxiety andobstructive sleep apnea.Recent hospitalization for pneumonia about a month ago. After his hospitalization in terms of his dyspnea he never did return to his baseline.On the day of admission was noted in severe respiratory distress by his primary care physician.On the initial physical examination he was noted in respiratory distress, he was placed on noninvasive mechanical ventilation, his blood pressure was 164/85, heart rate 74, respiratory rate26 and oxygen saturation 100% on BiPAP.Patient was in distress, he had decreased breath sounds bilaterally with rhonchi, heart S1-S2 present, rhythmic, abdomen soft nontender, trace lower extremity edema bilaterally.Sodium 140, potassium 4.0, chloride 99, bicarb 27, glucose 130, BUN 30, creatinine 11.3, white count 2.5, hemoglobin 9.3, hematocrit 28.7, platelets 103.His venous pH was 7.48.Chest x-ray with mild cardiomegaly, increased lung markings bilaterally, bibasilar atelectasis. CT chest negative for pulmonary embolism, bibasilar infiltrates. EKG sinus rhythm, normal axis, normal intervals, LVH with repolarization changes.  Patient was admitted to the hospital with a working diagnosis of acute hypoxic respiratory failure due to bilateral lower lobe pneumonia. This  improved with treatment and hypoxia has resolved.   Discharge Diagnoses:  Principal Problem:   HCAP (healthcare-associated pneumonia) Active Problems:   OSA on CPAP   HTN (hypertension)   Pancytopenia (HCC)   Obesity hypoventilation syndrome (HCC)   Acute respiratory distress   ESRD (end stage renal disease) (HCC)   Anxiety  Acute hypoxic respiratory failure due to bilateral lower lobespneumonia complicated by COPD exacerbation. - Completed 7 days ceftriaxone/azithromycin.  - Continue prednisone taper (required IV steroids while here) - Ordered albuterol inhaler prn and will need follow up with pulmonology. Continue symbicort.  - Suspect symptoms worsened by deconditioning, though pt refuses PT evaluation.  1 of 4 blood culture bottles with CoNS: Strongly suspected to be contaminant.  End-stage renal disease on hemodialysis. Clinically euvolemic. - Continue routine HD and home medications  Pancytopenia. Stable cell counts with wbc at 7,6 with hb 9,3and plt at 104.Continue darbepoetin on HD  Obstructive sleep apnea. ContinueCPAP qHS  Anxiety. Continue withclonazepam.  Dyslipidemia.Pravastatin 40 mg daily with good tolerance  HTN.Continue metoprolol 75 mg po bid and hydralazine 50 mg tid.  Discharge Instructions Discharge Instructions    Diet - low sodium heart healthy   Complete by:  As directed    Discharge instructions   Complete by:  As directed    You were admitted for pneumonia that caused a COPD exacerbation which has improved with treatment. Fortunately you have completed the course of antibiotics for the pneumonia and can be discharged with the following recommendations:  - Continue taking medications as you were including symbicort - Albuterol inhaler was also sent to your pharmacy to help for "rescue" to be used only as needed for shortness of breath and/or wheezing.  - Take the prednisone taper as directed (60mg  x 3 days, 40mg  x 3 days, then 20mg   x 3 days).  - Your cough will continue, but is expected to improve slowly.  -  Follow up with your PCP and [facility] in the next several weeks or seek medical attention sooner if you develop worsening shortness of breath despite using the inhaler.   Increase activity slowly   Complete by:  As directed      Allergies as of 06/05/2017      Reactions   Codeine Nausea And Vomiting, Nausea Only   Altered mental status      Medication List    TAKE these medications   acetaminophen 500 MG tablet Commonly known as:  TYLENOL Take 1 tablet (500 mg total) by mouth every 6 (six) hours as needed for moderate pain.   albuterol 108 (90 Base) MCG/ACT inhaler Commonly known as:  PROVENTIL HFA;VENTOLIN HFA Inhale 2 puffs into the lungs every 6 (six) hours as needed for wheezing or shortness of breath.   aspirin EC 81 MG tablet Take 81 mg by mouth daily.   b complex-vitamin c-folic acid 0.8 MG Tabs tablet TAKE 1 TABLET BY MOUTH EVERY DAY   BENADRYL 25 MG tablet Generic drug:  diphenhydrAMINE Take 25 mg by mouth at bedtime as needed for allergies.   budesonide-formoterol 80-4.5 MCG/ACT inhaler Commonly known as:  SYMBICORT Inhale 2 puffs into the lungs 2 (two) times daily.   calcitRIOL 0.25 MCG capsule Commonly known as:  ROCALTROL Take 1 capsule (0.25 mcg total) by mouth every Monday, Wednesday, and Friday with hemodialysis.   calcium acetate 667 MG capsule Commonly known as:  PHOSLO Take by mouth 3 (three) times daily with meals. Take 4 capsule with snacks and 8 capsules with meals.   clonazePAM 0.5 MG tablet Commonly known as:  KLONOPIN Take 1 tablet (0.5 mg total) by mouth 2 (two) times daily.   guaiFENesin 600 MG 12 hr tablet Commonly known as:  MUCINEX Take 600 mg by mouth 2 (two) times daily.   guaiFENesin-dextromethorphan 100-10 MG/5ML syrup Commonly known as:  ROBITUSSIN DM Take 10 mLs by mouth every 4 (four) hours as needed for cough.   hydrALAZINE 50 MG  tablet Commonly known as:  APRESOLINE Take 1 tablet (50 mg total) by mouth every 8 (eight) hours.   metoprolol tartrate 50 MG tablet Commonly known as:  LOPRESSOR Take 75 mg by mouth 2 (two) times daily.   omeprazole 20 MG capsule Commonly known as:  PRILOSEC TAKE 1 CAPSULE BY MOUTH 2 TIMES DAILY BEFORE A MEAL.   ondansetron 4 MG disintegrating tablet Commonly known as:  ZOFRAN ODT Take 1 tablet (4 mg total) by mouth every 8 (eight) hours as needed for nausea or vomiting.   pravastatin 40 MG tablet Commonly known as:  PRAVACHOL TAKE 1 TABLET EVERY EVENING   predniSONE 20 MG tablet Commonly known as:  DELTASONE Take 60mg  x 2 days, 40mg  x 2 days, 20mg  x 3 days   PROBIOTIC-10 PO Take 1 tablet by mouth daily.   SENSIPAR 30 MG tablet Generic drug:  cinacalcet Take 30 mg by mouth daily. With dinner.   traMADol 50 MG tablet Commonly known as:  ULTRAM Take 1 tablet (50 mg total) by mouth daily as needed (severe headache).   Vitamin D3 5000 units Tabs Take 1 tablet by mouth daily.      Follow-up Information    Reed, Tiffany L, DO. Schedule an appointment as soon as possible for a visit in 2 week(s).   Specialty:  Geriatric Medicine Contact information: [address] [place] [address] [phone]        [name], MD .  Specialty:  Pulmonary Disease Contact information: [address] [place] [address] [phone]          Allergies  Allergen Reactions  . Codeine Nausea And Vomiting and Nausea Only    Altered mental status    Consultations:  Nephrology  Procedures/Studies: Ct Angio Chest Pe W And/or Wo Contrast  Result Date: 05/30/2017 CLINICAL DATA:  57 year old male with shortness of breath. Concern for pulmonary embolism. EXAM: CT ANGIOGRAPHY CHEST WITH CONTRAST TECHNIQUE: Multidetector CT imaging of the chest was performed using the standard protocol during bolus administration of intravenous contrast. Multiplanar CT image  reconstructions and MIPs were obtained to evaluate the vascular anatomy. CONTRAST:  <See Chart> ISOVUE-370 IOPAMIDOL (ISOVUE-370) INJECTION 76% COMPARISON:  Chest radiograph dated 05/30/2017 and CT dated 11/06/2016 FINDINGS: Cardiovascular: Mild cardiomegaly. There is coronary vascular calcification primarily involving the LAD. Mild atherosclerotic calcification of the thoracic aorta. No aneurysmal dilatation or evidence of dissection. Evaluation of the pulmonary arteries is somewhat limited due to suboptimal opacification of the distal branches. No large or central pulmonary artery embolus identified. Mediastinum/Nodes: Mildly enlarged right hilar and subcarinal lymph nodes measure up to 13 mm in short axis in the subcarinal region. Esophagus and the thyroid gland are grossly unremarkable. No mediastinal fluid collection. Lungs/Pleura: There are patchy areas of consolidative changes and ground-glass densities in the lower lobes bilaterally most consistent with pneumonia. Clinical correlation and follow-up to resolution is recommended. There is no pleural effusion or pneumothorax. The central airways are patent. Upper Abdomen: Cholecystectomy. The visualized upper abdomen is otherwise unremarkable. Musculoskeletal: Mild degenerative changes of the spine. No acute osseous pathology. Review of the MIP images confirms the above findings. IMPRESSION: 1. No CT evidence of central pulmonary artery embolus. 2. Bilateral lower lobe consolidative changes consistent with pneumonia. Clinical correlation and follow-up to resolution recommended. 3. Mild cardiomegaly and coronary vascular calcification. Electronically Signed   By: Anner Crete M.D.   On: 05/30/2017 22:22   Dg Chest Portable 1 View  Result Date: 05/30/2017 CLINICAL DATA:  Respiratory distress. Recent hospitalization for pneumonia. EXAM: PORTABLE CHEST 1 VIEW COMPARISON:  Two-view chest 04/29/2017. FINDINGS: Cardiomegaly. Faint RIGHT upper lobe opacity is  improved, consistent with clearing pneumonia. Mild increased linear opacities at both lung bases, likely subsegmental atelectasis from low lung volumes. No effusion or pneumothorax. No edema or dense consolidation. IMPRESSION: Cardiomegaly with improved RIGHT upper lobe infiltrate. Low lung volumes with mild subsegmental atelectasis. No dense consolidation or edema. Electronically Signed   By: Staci Righter M.D.   On: 05/30/2017 15:02   Subjective: Had an episode of knee weakness yesterday, shaking/trembling when trying to stand. Today he reports that resolved spontaneously last night and he wants to go home.   Discharge Exam: Vitals:   06/05/17 1449 06/05/17 1502  BP: (!) 142/72 (!) 142/72  Pulse:  69  Resp:    Temp:    SpO2:     General: Pt is alert, awake, not in acute distress on HD. Cardiovascular: RRR, S1/S2 +, no rubs, no gallops Respiratory: CTA bilaterally, no wheezing, no rhonchi Abdominal: Soft, NT, ND, bowel sounds + Extremities: No edema, no cyanosis  Labs: BNP (last 3 results) Recent Labs    09/12/16 2338  BNP 673.4*   Basic Metabolic Panel: Recent Labs  Lab 05/31/17 0313 06/01/17 0350 06/02/17 0725 06/03/17 0335 06/04/17 0347 06/05/17 0346  NA 138 136 141 141 140 140  K 4.3 4.7 4.1 4.6 3.3* 3.8  CL 99* 97* 101 98* 100* 99*  CO2 26 24 27 27 26 28   GLUCOSE 109* 220* 128* 201* 130* 114*  BUN 25* 54* 41* 63* 38* 53*  CREATININE 8.51* 12.06* 10.16* 12.24* 8.27* 11.50*  CALCIUM 8.6* 9.2 9.5 9.6 9.0 10.0  PHOS 5.3*  --   --   --   --   --    Liver Function Tests: Recent Labs  Lab 05/30/17 1448 05/31/17 0313  AST 21  --   ALT 22  --   ALKPHOS 55  --   BILITOT 0.8  --   PROT 6.7  --   ALBUMIN 3.5 3.4*   No results for input(s): LIPASE, AMYLASE in the last 168 hours. No results for input(s): AMMONIA in the last 168 hours. CBC: Recent Labs  Lab 05/31/17 0313 06/01/17 0350 06/02/17 0725 06/03/17 0815 06/04/17 0347 06/05/17 0346  WBC 4.6 4.2 9.3  9.4 7.6 8.7  NEUTROABS 3.8 3.7 7.9*  --  5.1 6.4  HGB 9.1* 9.0* 9.5* 8.7* 9.3* 9.0*  HCT 28.7* 27.7* 29.5* 26.3* 28.3* 26.7*  MCV 97.6 96.2 96.7 94.6 96.9 94.3  PLT 113* 106* 131* 107* 104* 103*   Cardiac Enzymes: No results for input(s): CKTOTAL, CKMB, CKMBINDEX, TROPONINI in the last 168 hours. BNP: Invalid input(s): POCBNP CBG: Recent Labs  Lab 06/03/17 0620 06/03/17 1256 06/04/17 0625 06/04/17 1459 06/05/17 0606  GLUCAP 151* 206* 183* 208* 167*   D-Dimer No results for input(s): DDIMER in the last 72 hours. Hgb A1c No results for input(s): HGBA1C in the last 72 hours. Lipid Profile No results for input(s): CHOL, HDL, LDLCALC, TRIG, CHOLHDL, LDLDIRECT in the last 72 hours. Thyroid function studies No results for input(s): TSH, T4TOTAL, T3FREE, THYROIDAB in the last 72 hours.  Invalid input(s): FREET3 Anemia work up No results for input(s): VITAMINB12, FOLATE, FERRITIN, TIBC, IRON, RETICCTPCT in the last 72 hours. Urinalysis    Component Value Date/Time   COLORURINE YELLOW 02/19/2017 1520   APPEARANCEUR HAZY (A) 02/19/2017 1520   LABSPEC 1.012 02/19/2017 1520   PHURINE 8.0 02/19/2017 1520   GLUCOSEU 50 (A) 02/19/2017 1520   HGBUR MODERATE (A) 02/19/2017 1520   BILIRUBINUR NEGATIVE 02/19/2017 1520   KETONESUR NEGATIVE 02/19/2017 1520   PROTEINUR >=300 (A) 02/19/2017 1520   UROBILINOGEN 0.2 04/06/2014 1030   NITRITE NEGATIVE 02/19/2017 1520   LEUKOCYTESUR MODERATE (A) 02/19/2017 1520    Microbiology Recent Results (from the past 240 hour(s))  Culture, blood (routine x 2) Call MD if unable to obtain prior to antibiotics being given     Status: None   Collection Time: 05/30/17 10:40 PM  Result Value Ref Range Status   Specimen Description BLOOD RIGHT ARM  Final   Special Requests   Final    BOTTLES DRAWN AEROBIC AND ANAEROBIC Blood Culture adequate volume   Culture   Final    NO GROWTH 5 DAYS Performed at [facility], [place] [address], [place],  [place] [address]    Report Status 06/05/2017 FINAL  Final  Culture, blood (routine x 2) Call MD if unable to obtain prior to antibiotics being given     Status: Abnormal   Collection Time: 05/30/17 10:40 PM  Result Value Ref Range Status   Specimen Description BLOOD RIGHT HAND  Final   Special Requests   Final    BOTTLES DRAWN AEROBIC AND ANAEROBIC Blood Culture adequate volume   Culture  Setup Time   Final    GRAM POSITIVE COCCI ANAEROBIC BOTTLE ONLY CRITICAL RESULT CALLED TO, READ  BACK BY AND VERIFIED WITH: R RUMBARGER,PHARMD AT 1319 06/01/17 BY L BENFIELD    Culture (A)  Final    STAPHYLOCOCCUS SPECIES (COAGULASE NEGATIVE) THE SIGNIFICANCE OF ISOLATING THIS ORGANISM FROM A SINGLE SET OF BLOOD CULTURES WHEN MULTIPLE SETS ARE DRAWN IS UNCERTAIN. PLEASE NOTIFY THE MICROBIOLOGY DEPARTMENT WITHIN ONE WEEK IF SPECIATION AND SENSITIVITIES ARE REQUIRED. Performed at [facility], [place] [address] [place], [place] [address]    Report Status 06/03/2017 FINAL  Final  Blood Culture ID Panel (Reflexed)     Status: None   Collection Time: 05/30/17 10:40 PM  Result Value Ref Range Status   Enterococcus species NOT DETECTED NOT DETECTED Final   Listeria monocytogenes NOT DETECTED NOT DETECTED Final   Staphylococcus species NOT DETECTED NOT DETECTED Final   Staphylococcus aureus NOT DETECTED NOT DETECTED Final   Streptococcus species NOT DETECTED NOT DETECTED Final   Streptococcus agalactiae NOT DETECTED NOT DETECTED Final   Streptococcus pneumoniae NOT DETECTED NOT DETECTED Final   Streptococcus pyogenes NOT DETECTED NOT DETECTED Final   Acinetobacter baumannii NOT DETECTED NOT DETECTED Final   Enterobacteriaceae species NOT DETECTED NOT DETECTED Final   Enterobacter cloacae complex NOT DETECTED NOT DETECTED Final   Escherichia coli NOT DETECTED NOT DETECTED Final   Klebsiella oxytoca NOT DETECTED NOT DETECTED Final   Klebsiella pneumoniae NOT DETECTED NOT DETECTED Final   Proteus species NOT  DETECTED NOT DETECTED Final   Serratia marcescens NOT DETECTED NOT DETECTED Final   Haemophilus influenzae NOT DETECTED NOT DETECTED Final   Neisseria meningitidis NOT DETECTED NOT DETECTED Final   Pseudomonas aeruginosa NOT DETECTED NOT DETECTED Final   Candida albicans NOT DETECTED NOT DETECTED Final   Candida glabrata NOT DETECTED NOT DETECTED Final   Candida krusei NOT DETECTED NOT DETECTED Final   Candida parapsilosis NOT DETECTED NOT DETECTED Final   Candida tropicalis NOT DETECTED NOT DETECTED Final    Comment: Performed at [facility], [place] [address] [place], [place] [address]  Culture, sputum-assessment     Status: None   Collection Time: 05/31/17  6:16 PM  Result Value Ref Range Status   Specimen Description EXPECTORATED SPUTUM  Final   Special Requests NONE  Final   Sputum evaluation   Final    THIS SPECIMEN IS ACCEPTABLE FOR SPUTUM CULTURE Performed at [facility], [address] [place], [place] [address]    Report Status 06/01/2017 FINAL  Final  Culture, respiratory (NON-Expectorated)     Status: None   Collection Time: 05/31/17  6:16 PM  Result Value Ref Range Status   Specimen Description EXPECTORATED SPUTUM  Final   Special Requests NONE Reflexed from [id number]  Final   Gram Stain   Final    FEW WBC PRESENT, PREDOMINANTLY PMN FEW GRAM NEGATIVE RODS FEW GRAM POSITIVE RODS FEW YEAST    Culture   Final    Consistent with normal respiratory flora. Performed at [facility], [place] [address] [place], [place] [address]    Report Status 06/03/2017 FINAL  Final    Time coordinating discharge: Approximately 40 minutes  Patrecia Pour, MD  [facility] 06/05/2017, 3:29 PM Pager [phone]

## 2017-06-05 NOTE — Progress Notes (Addendum)
Discharge instructions reviewed with patient and patient's wife. Both verbalized understanding.   Emelda Fear, RN

## 2017-06-05 NOTE — Care Management Note (Signed)
Case Management Note Marvetta Gibbons RN, BSN Unit 4E-Case Manager [phone]  Patient Details  Name: Kirk [name] MRN: [id number] Date of Birth: 1960/06/27  Subjective/Objective:   Pt admitted with HCAP                 Action/Plan: PTA pt lived at home with wife- plan to return home- no CM needs noted for transition home.  Expected Discharge Date:  06/05/17               Expected Discharge Plan:  Home/Self Care  In-House Referral:  NA  Discharge planning Services  CM Consult  Post Acute Care Choice:  NA Choice offered to:  NA  DME Arranged:    DME Agency:     HH Arranged:    HH Agency:     Status of Service:  Completed, signed off  If discussed at [place] of Stay Meetings, dates discussed:    Discharge Disposition: home/self care   Additional Comments:  Dawayne Patricia, RN 06/05/2017, 3:38 PM

## 2017-06-05 NOTE — Consult Note (Signed)
   [facility] CM Inpatient Consult   06/05/2017  LEDELL CODRINGTON 06/12/1960 [id number]   Patient screened for potential [facility] Care Management due to increased risk of unplanned hospital readmission score of 48%.  Spoke with Mr. Veldhuizen at bedside to offer [facility] Management program. Mr. Guzzetta declines by stating " I will be fine. I know how to contact you if I need you." Provided [facility] Care Management brochure with contact information along with 24-hr nurse advice line magnet.   Will make inpatient RNCM aware Mr. Hicks declined [facility] Management services.    Marthenia Rolling, MSN-Ed, RN,BSN [facility] Liaison [phone]

## 2017-06-05 NOTE — Progress Notes (Signed)
Into reassess pt and patient had showered, put on clothes and removed his IV. Wife was at bedside. IV site was covered with gauze and tape. Patient states, "I am being discharged today"

## 2017-06-05 NOTE — Progress Notes (Signed)
Patient off the floor for dialysis at this time.

## 2017-06-07 ENCOUNTER — Telehealth: Payer: Self-pay

## 2017-06-07 DIAGNOSIS — D631 Anemia in chronic kidney disease: Secondary | ICD-10-CM | POA: Diagnosis not present

## 2017-06-07 DIAGNOSIS — N2581 Secondary hyperparathyroidism of renal origin: Secondary | ICD-10-CM | POA: Diagnosis not present

## 2017-06-07 DIAGNOSIS — N186 End stage renal disease: Secondary | ICD-10-CM | POA: Diagnosis not present

## 2017-06-07 NOTE — Telephone Encounter (Signed)
I have made the 1st attempt to contact the patient or family member in charge, in order to follow up from recently being discharged from the hospital. I left a message on voicemail but I will make another attempt at a different time.  

## 2017-06-07 NOTE — Telephone Encounter (Signed)
I have made the 2nd attempt to contact the patient or family member in charge, in order to follow up from recently being discharged from the hospital. I left a message on voicemail but I will make another attempt at a different time.  

## 2017-06-10 DIAGNOSIS — N186 End stage renal disease: Secondary | ICD-10-CM | POA: Diagnosis not present

## 2017-06-10 DIAGNOSIS — D631 Anemia in chronic kidney disease: Secondary | ICD-10-CM | POA: Diagnosis not present

## 2017-06-10 DIAGNOSIS — N2581 Secondary hyperparathyroidism of renal origin: Secondary | ICD-10-CM | POA: Diagnosis not present

## 2017-06-11 ENCOUNTER — Telehealth: Payer: Self-pay | Admitting: Internal Medicine

## 2017-06-11 NOTE — Telephone Encounter (Signed)
I left a message asking the pt to call me at [phone] to schedule AWV. VDM (DD)

## 2017-06-11 NOTE — Telephone Encounter (Signed)
Noted  

## 2017-06-12 DIAGNOSIS — N2581 Secondary hyperparathyroidism of renal origin: Secondary | ICD-10-CM | POA: Diagnosis not present

## 2017-06-12 DIAGNOSIS — N186 End stage renal disease: Secondary | ICD-10-CM | POA: Diagnosis not present

## 2017-06-12 DIAGNOSIS — D631 Anemia in chronic kidney disease: Secondary | ICD-10-CM | POA: Diagnosis not present

## 2017-06-13 ENCOUNTER — Ambulatory Visit: Payer: Self-pay | Admitting: Internal Medicine

## 2017-06-13 ENCOUNTER — Ambulatory Visit: Payer: Medicare Other | Admitting: Internal Medicine

## 2017-06-14 DIAGNOSIS — N2581 Secondary hyperparathyroidism of renal origin: Secondary | ICD-10-CM | POA: Diagnosis not present

## 2017-06-14 DIAGNOSIS — N186 End stage renal disease: Secondary | ICD-10-CM | POA: Diagnosis not present

## 2017-06-14 DIAGNOSIS — D631 Anemia in chronic kidney disease: Secondary | ICD-10-CM | POA: Diagnosis not present

## 2017-06-17 DIAGNOSIS — N186 End stage renal disease: Secondary | ICD-10-CM | POA: Diagnosis not present

## 2017-06-17 DIAGNOSIS — D631 Anemia in chronic kidney disease: Secondary | ICD-10-CM | POA: Diagnosis not present

## 2017-06-17 DIAGNOSIS — N2581 Secondary hyperparathyroidism of renal origin: Secondary | ICD-10-CM | POA: Diagnosis not present

## 2017-06-19 DIAGNOSIS — D631 Anemia in chronic kidney disease: Secondary | ICD-10-CM | POA: Diagnosis not present

## 2017-06-19 DIAGNOSIS — N2581 Secondary hyperparathyroidism of renal origin: Secondary | ICD-10-CM | POA: Diagnosis not present

## 2017-06-19 DIAGNOSIS — N186 End stage renal disease: Secondary | ICD-10-CM | POA: Diagnosis not present

## 2017-06-21 DIAGNOSIS — N186 End stage renal disease: Secondary | ICD-10-CM | POA: Diagnosis not present

## 2017-06-21 DIAGNOSIS — N2581 Secondary hyperparathyroidism of renal origin: Secondary | ICD-10-CM | POA: Diagnosis not present

## 2017-06-21 DIAGNOSIS — D631 Anemia in chronic kidney disease: Secondary | ICD-10-CM | POA: Diagnosis not present

## 2017-06-22 DIAGNOSIS — Z992 Dependence on renal dialysis: Secondary | ICD-10-CM | POA: Diagnosis not present

## 2017-06-22 DIAGNOSIS — T8612 Kidney transplant failure: Secondary | ICD-10-CM | POA: Diagnosis not present

## 2017-06-22 DIAGNOSIS — N186 End stage renal disease: Secondary | ICD-10-CM | POA: Diagnosis not present

## 2017-06-24 ENCOUNTER — Telehealth: Payer: Self-pay | Admitting: *Deleted

## 2017-06-24 DIAGNOSIS — N186 End stage renal disease: Secondary | ICD-10-CM | POA: Diagnosis not present

## 2017-06-24 DIAGNOSIS — N2581 Secondary hyperparathyroidism of renal origin: Secondary | ICD-10-CM | POA: Diagnosis not present

## 2017-06-24 DIAGNOSIS — E8770 Fluid overload, unspecified: Secondary | ICD-10-CM | POA: Diagnosis not present

## 2017-06-24 DIAGNOSIS — D631 Anemia in chronic kidney disease: Secondary | ICD-10-CM | POA: Diagnosis not present

## 2017-06-24 NOTE — Telephone Encounter (Signed)
If he's not having other symptoms, he does not need to proceed to the ED.  If he is weak, dizzy, having low blood pressures, he should go to the ED.

## 2017-06-24 NOTE — Telephone Encounter (Signed)
Patient wife notified and agreed.  

## 2017-06-24 NOTE — Telephone Encounter (Signed)
Kirk [name], wife called and stated that patient had dialysis today. Stated that he has not drank anything and has already had 14 bouts of diarrhea, pure liquid. Patient has an appointment tomorrow with Janett Billow but wife is wondering if she needs to take patient to the ER. Not complaining of any other symptoms except diarrhea. Please Advise.

## 2017-06-25 ENCOUNTER — Ambulatory Visit (INDEPENDENT_AMBULATORY_CARE_PROVIDER_SITE_OTHER): Payer: Medicare Other | Admitting: Nurse Practitioner

## 2017-06-25 ENCOUNTER — Encounter: Payer: Self-pay | Admitting: Nurse Practitioner

## 2017-06-25 ENCOUNTER — Ambulatory Visit (INDEPENDENT_AMBULATORY_CARE_PROVIDER_SITE_OTHER): Payer: Medicare Other

## 2017-06-25 VITALS — BP 158/70 | HR 84 | Temp 97.8°F | Ht 67.0 in | Wt 212.0 lb

## 2017-06-25 DIAGNOSIS — J9601 Acute respiratory failure with hypoxia: Secondary | ICD-10-CM | POA: Diagnosis not present

## 2017-06-25 DIAGNOSIS — Z0000 Encounter for general adult medical examination without abnormal findings: Secondary | ICD-10-CM | POA: Diagnosis not present

## 2017-06-25 DIAGNOSIS — F419 Anxiety disorder, unspecified: Secondary | ICD-10-CM | POA: Diagnosis not present

## 2017-06-25 DIAGNOSIS — R197 Diarrhea, unspecified: Secondary | ICD-10-CM | POA: Diagnosis not present

## 2017-06-25 DIAGNOSIS — G44209 Tension-type headache, unspecified, not intractable: Secondary | ICD-10-CM | POA: Diagnosis not present

## 2017-06-25 DIAGNOSIS — J181 Lobar pneumonia, unspecified organism: Secondary | ICD-10-CM

## 2017-06-25 DIAGNOSIS — J189 Pneumonia, unspecified organism: Secondary | ICD-10-CM

## 2017-06-25 DIAGNOSIS — J449 Chronic obstructive pulmonary disease, unspecified: Secondary | ICD-10-CM

## 2017-06-25 MED ORDER — CLONAZEPAM 0.5 MG PO TABS: 0.5000 mg | ORAL_TABLET | Freq: Every day | ORAL | 0 refills | Status: DC | PRN

## 2017-06-25 MED ORDER — TRAMADOL HCL 50 MG PO TABS: 50.0000 mg | ORAL_TABLET | Freq: Every day | ORAL | 0 refills | Status: DC | PRN

## 2017-06-25 NOTE — Progress Notes (Signed)
Careteam: Patient Care Team: Gayland Curry, DO as PCP - General (Geriatric Medicine) Jovita Kussmaul, MD as Consulting Physician (General Surgery) [name], MD as Consulting Physician (Pulmonary Disease) Estanislado Emms, MD as Consulting Physician (Nephrology) Elam Dutch, MD as Consulting Physician (Vascular Surgery) [name], MD as Consulting Physician (Cardiology) Milus Banister, MD as Attending Physician (Gastroenterology)  Advanced Directive information Does Patient Have a Medical Advance Directive?: No  Allergies  Allergen Reactions  . Codeine Nausea And Vomiting and Nausea Only    Altered mental status    Chief Complaint  Patient presents with  . Transitions Of Care    Pt is being seen due to recent admission to [facility] 5/9 to 5/15.   . Medication Refill    Rx for tramadol and clonazepam pended. [place] database verified     HPI: Patient is a 57 y.o. male seen in the office today to follow up hospitalization. Pt with hx of  end-stage renal disease on hemodialysis, chronic pancytopenia, hypertension, anxiety andobstructive sleep apnea. Pt was recently hospitalized due to acute hypoxic respiratory failure due to bilateral lobe pneumonia.  He completed 7 days of ceftriaxone/axithromycin as well as prednisone taper  Having cough but shortness of breath improved. Wife and pt states that he was taken off Symbicort and they changed him to something else but unsure what it was. States he was told to take another medication daily. Also using mucinex DM by mouth twice daily.  Using albuterol nebulizer as well PRN.   Reports he had diarrhea 22 times but has improved today (only 1 episode). Wife gave him probiotics to see if this would help.  Had dialysis yesterday  Feeling well today other than diarrhea yesterday. Improved energy.  Yesterday had some shortness of breath but better today. O2 98% Had blood work yesterday at dialysis.  Protein low, wife trying to  increase protein.  Decrease appetite but no N/V or abdominal pain Increase GERD - out of his omeprazole, plans to get this refilled today  Review of Systems:  Review of Systems  Constitutional: Negative for chills, fever and malaise/fatigue.  Respiratory: Negative for cough and shortness of breath.   Cardiovascular: Negative for chest pain and leg swelling.  Gastrointestinal: Positive for diarrhea and heartburn. Negative for abdominal pain, constipation, nausea and vomiting.  Genitourinary:       Dialysis   Skin: Negative for itching and rash.  Neurological: Positive for headaches (chronic, tramadol helps). Negative for dizziness.    Past Medical History:  Diagnosis Date  . Acute edema of lung, unspecified   . Acute, but ill-defined, cerebrovascular disease   . Allergy   . Anemia   . 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)   . Anxiety   . Asthma   . Carpal tunnel syndrome   . Cellulitis and abscess of trunk   . Cholelithiasis 07/13/2014  . Chronic headaches   . Debility, unspecified   . Dermatophytosis of the body   . Dysrhythmia    history of  . Edema   . End stage renal disease on dialysis [facility])    "MWF; [facility] in [facility]" (10/21/2014)  . Essential hypertension, benign   . GERD (gastroesophageal reflux disease)   . Gout, unspecified   . HTN (hypertension)   . Hypertrophy of prostate without urinary obstruction and other lower urinary tract symptoms (LUTS)   . Hypotension, unspecified   . Impotence of organic origin   . Insomnia, unspecified   .  Kidney replaced by transplant   . Localization-related (focal) (partial) epilepsy and epileptic syndromes with complex partial seizures, without mention of intractable epilepsy   . Lumbago   . Memory loss   . OSA on CPAP   . Other and unspecified hyperlipidemia   . Other chronic nonalcoholic liver disease   . Other malaise and fatigue   . Other nonspecific abnormal serum enzyme levels   . Pain in joint, lower  leg   . Pain in joint, upper arm   . Pneumonia "several times"  . Renal dialysis status(V45.11) 02/05/2010   restarted 01/02/13 ofter renal trransplant failure  . Secondary hyperparathyroidism (of renal origin)   . Shortness of breath   . Sleep apnea   . Tension headache   . Unspecified constipation   . Unspecified essential hypertension   . Unspecified hereditary and idiopathic peripheral neuropathy   . Unspecified vitamin D deficiency    Past Surgical History:  Procedure Laterality Date  . AV FISTULA PLACEMENT Left ?2010   "forearm; at [facility]"  . BACK SURGERY    . CARDIAC CATHETERIZATION  03/21/2011  . CHOLECYSTECTOMY N/A 10/21/2014   Procedure: LAPAROSCOPIC CHOLECYSTECTOMY WITH INTRAOPERATIVE CHOLANGIOGRAM;  Surgeon: Autumn Messing III, MD;  Location: [place];  Service: General;  Laterality: N/A;  . COLONOSCOPY    . 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 KIDNEY TRANSPLANT  08/17/2011   [place]  10/21/2014   w/IOC  . LEFT HEART CATHETERIZATION WITH CORONARY ANGIOGRAM N/A 03/21/2011   Procedure: LEFT HEART CATHETERIZATION WITH CORONARY ANGIOGRAM;  Surgeon: [name], MD;  Location: [facility] CATH LAB;  Service: Cardiovascular;  Laterality: N/A;  . NEPHRECTOMY  08/2013   removed transplaned kidney  . POSTERIOR FUSION CERVICAL SPINE  06/25/2012   for spinal stenosis  . VASECTOMY  2010   Social History:   reports that he has been smoking e-cigarettes.  He has a 16.00 pack-year smoking history. He has never used smokeless tobacco. He reports that he does not drink alcohol or use drugs.  Family History  Adopted: Yes  Problem Relation Age of Onset  . Colon cancer Neg Hx   . Esophageal cancer Neg Hx   . Rectal cancer Neg Hx   . Stomach cancer Neg Hx     Medications: Patient's Medications  New Prescriptions   No medications on file  Previous Medications   ACETAMINOPHEN (TYLENOL) 500 MG TABLET    Take 1 tablet (500 mg  total) by mouth every 6 (six) hours as needed for moderate pain.   ALBUTEROL (PROVENTIL HFA;VENTOLIN HFA) 108 (90 BASE) MCG/ACT INHALER    Inhale 2 puffs into the lungs every 6 (six) hours as needed for wheezing or shortness of breath.   ASPIRIN EC 81 MG TABLET    Take 81 mg by mouth daily.   B COMPLEX-VITAMIN C-FOLIC ACID (NEPHRO-VITE) 0.8 MG TABS TABLET    TAKE 1 TABLET BY MOUTH EVERY DAY   BUDESONIDE-FORMOTEROL (SYMBICORT) 80-4.5 MCG/ACT INHALER    Inhale 2 puffs into the lungs 2 (two) times daily.   CALCITRIOL (ROCALTROL) 0.25 MCG CAPSULE    Take 1 capsule (0.25 mcg total) by mouth every Monday, Wednesday, and Friday with hemodialysis.   CALCIUM ACETATE (PHOSLO) 667 MG CAPSULE    Take by mouth 3 (three) times daily with meals. Take 4 capsule with snacks and 8 capsules with meals.   CHOLECALCIFEROL (VITAMIN D3) 5000 UNITS TABS  Take 1 tablet by mouth daily.   CINACALCET (SENSIPAR) 30 MG TABLET    Take 30 mg by mouth daily. With dinner.   CLONAZEPAM (KLONOPIN) 0.5 MG TABLET    Take 1 tablet (0.5 mg total) by mouth 2 (two) times daily.   DIPHENHYDRAMINE (BENADRYL) 25 MG TABLET    Take 25 mg by mouth at bedtime as needed for allergies.   GUAIFENESIN (MUCINEX) 600 MG 12 HR TABLET    Take 600 mg by mouth 2 (two) times daily.   GUAIFENESIN-DEXTROMETHORPHAN (ROBITUSSIN DM) 100-10 MG/5ML SYRUP    Take 10 mLs by mouth every 4 (four) hours as needed for cough.   HYDRALAZINE (APRESOLINE) 50 MG TABLET    Take 1 tablet (50 mg total) by mouth every 8 (eight) hours.   METOPROLOL TARTRATE (LOPRESSOR) 50 MG TABLET    Take 75 mg by mouth 2 (two) times daily.   OMEPRAZOLE (PRILOSEC) 20 MG CAPSULE    TAKE 1 CAPSULE BY MOUTH 2 TIMES DAILY BEFORE A MEAL.   ONDANSETRON (ZOFRAN ODT) 4 MG DISINTEGRATING TABLET    Take 1 tablet (4 mg total) by mouth every 8 (eight) hours as needed for nausea or vomiting.   PRAVASTATIN (PRAVACHOL) 40 MG TABLET    TAKE 1 TABLET EVERY EVENING   PREDNISONE (DELTASONE) 20 MG TABLET    Take  60mg  x 2 days, 40mg  x 2 days, 20mg  x 3 days   PROBIOTIC PRODUCT (PROBIOTIC-10 PO)    Take 1 tablet by mouth daily.   TRAMADOL (ULTRAM) 50 MG TABLET    Take 1 tablet (50 mg total) by mouth daily as needed (severe headache).  Modified Medications   No medications on file  Discontinued Medications   No medications on file     Physical Exam:  Vitals:   06/25/17 1404  BP: (!) 158/70  Pulse: 84  Temp: 97.8 F (36.6 C)  TempSrc: Oral  SpO2: 98%  Weight: 212 lb (96.2 kg)  Height: 5\' 7"  (1.702 m)   Body mass index is 33.2 kg/m.  Physical Exam  Constitutional: He is oriented to person, place, and time. He appears well-developed and well-nourished. No distress.  HENT:  Head: Normocephalic and atraumatic.  Mouth/Throat: Oropharynx is clear and moist. No oropharyngeal exudate.  Eyes: Pupils are equal, round, and reactive to light. Conjunctivae and EOM are normal.  Neck: Normal range of motion. Neck supple.  Cardiovascular: Normal rate, regular rhythm and normal heart sounds.  Pulmonary/Chest: Effort normal. He has wheezes (right lower lobe).  Abdominal: Soft. Bowel sounds are normal.  Musculoskeletal: He exhibits no edema or tenderness.  Neurological: He is alert and oriented to person, place, and time.  Skin: Skin is warm and dry. He is not diaphoretic.  Psychiatric: He has a normal mood and affect.    Labs reviewed: Basic Metabolic Panel: Recent Labs    11/05/16 0509 11/06/16 0500 11/07/16 0549  01/30/17 0422  05/01/17 0455  05/31/17 0313  06/03/17 0335 06/04/17 0347 06/05/17 0346  NA 135 134* 132*   < > 135   < > 140   < > 138   < > 141 140 140  K 4.2 4.5 5.0   < > 4.9   < > 3.4*   < > 4.3   < > 4.6 3.3* 3.8  CL 94* 94* 93*   < > 94*   < > 98*   < > 99*   < > 98* 100* 99*  CO2 22 26 21*   < >  20*   < > 23   < > 26   < > 27 26 28   GLUCOSE 102* 99 120*   < > 175*   < > 126*   < > 109*   < > 201* 130* 114*  BUN 80* 42* 59*   < > 95*   < > 56*   < > 25*   < > 63* 38* 53*   CREATININE 18.16* 13.19* 15.82*   < > 16.27*   < > 13.83*   < > 8.51*   < > 12.24* 8.27* 11.50*  CALCIUM 7.7* 8.4* 7.9*   < > 9.3   < > 8.8*   < > 8.6*   < > 9.6 9.0 10.0  MG 2.5* 2.3 2.3  --   --   --   --   --   --   --   --   --   --   PHOS  --   --   --    < > 7.9*  --  9.0*  --  5.3*  --   --   --   --    < > = values in this interval not displayed.   Liver Function Tests: Recent Labs    04/29/17 1134 04/30/17 0449 05/01/17 0455 05/30/17 1448 05/31/17 0313  AST 29 28  --  21  --   ALT 28 21  --  22  --   ALKPHOS 69 54  --  55  --   BILITOT 0.7 0.9  --  0.8  --   PROT 8.5* 7.0  --  6.7  --   ALBUMIN 4.1 3.4* 3.2* 3.5 3.4*   Recent Labs    10/26/16 1705 11/02/16 0801 02/19/17 1341  LIPASE 89* 74* 43   No results for input(s): AMMONIA in the last 8760 hours. CBC: Recent Labs    06/02/17 0725 06/03/17 0815 06/04/17 0347 06/05/17 0346  WBC 9.3 9.4 7.6 8.7  NEUTROABS 7.9*  --  5.1 6.4  HGB 9.5* 8.7* 9.3* 9.0*  HCT 29.5* 26.3* 28.3* 26.7*  MCV 96.7 94.6 96.9 94.3  PLT 131* 107* 104* 103*   Lipid Panel: No results for input(s): CHOL, HDL, LDLCALC, TRIG, CHOLHDL, LDLDIRECT in the last 8760 hours. TSH: No results for input(s): TSH in the last 8760 hours. A1C: Lab Results  Component Value Date   HGBA1C 5.4 11/02/2016     Assessment/Plan 1. Tension headache -had improved but occasionally will have worsening headaches. Uses tramadol as needed for severe headache which has been effective in the past.  - traMADol (ULTRAM) 50 MG tablet; Take 1 tablet (50 mg total) by mouth daily as needed (severe headache).  Dispense: 30 tablet; Refill: 0  2. Anxiety -occasional anxiety, uses klonopin PRN - clonazePAM (KLONOPIN) 0.5 MG tablet; Take 1 tablet (0.5 mg total) by mouth daily as needed for anxiety.  Dispense: 30 tablet; Refill: 0  3. Acute respiratory failure with hypoxia (HCC) due to  Pneumonia of both lower lobes due to infectious organism [facility]) Improved, has  completed prednisone taper and antibiotics.  Encouraged to continue IS and deep breathing exercises.   4. Chronic obstructive pulmonary disease, unspecified COPD type ([place]) Wife reports he was taking off symbicort and placed on another maintenance inhaler which he has at home. Wife will call with this information. Went over discharge summary in detail with pt and wife.  Continues on mucinex DM by mouth twice daily  -has follow up  with Dr Melvyn Novas scheduled.   6. Diarrhea, unspecified type -improved today but still with loose stools. Hx of cdiff -to continue probiotic  - Ova and Parasite Exam; Future - Clostridium difficile Toxin B, Qualitative, Real-Time PCR([facility]); Future - Stool culture; Future  Next appt: 2 months with Dr Sharee Holster K. [place] [phone]

## 2017-06-25 NOTE — Progress Notes (Signed)
Subjective:   Kirk [name] is a 57 y.o. male who presents for an Initial Medicare Annual Wellness Visit.   Objective:    Today's Vitals   06/25/17 1355  BP: (!) 158/70  Pulse: 84  Temp: 97.8 F (36.6 C)  TempSrc: Oral  SpO2: 98%  Weight: 212 lb (96.2 kg)  Height: 5\' 7"  (1.702 m)   Body mass index is 33.2 kg/m.  Advanced Directives 06/25/2017 05/31/2017 05/30/2017 04/29/2017 02/19/2017 01/28/2017 01/28/2017  Does Patient Have a Medical Advance Directive? No No No No No No No  Does patient want to make changes to medical advance directive? - - - - No - Patient declined No - Patient declined -  Would patient like information on creating a medical advance directive? Yes (MAU/Ambulatory/Procedural Areas - Information given) No - Patient declined No - Patient declined No - Patient declined No - Patient declined No - Patient declined -  Pre-existing out of facility DNR order (yellow form or pink MOST form) - - - - - - -    Current Medications (verified) Outpatient Encounter Medications as of 06/25/2017  Medication Sig  . acetaminophen (TYLENOL) 500 MG tablet Take 1 tablet (500 mg total) by mouth every 6 (six) hours as needed for moderate pain.  Marland Kitchen albuterol (PROVENTIL HFA;VENTOLIN HFA) 108 (90 Base) MCG/ACT inhaler Inhale 2 puffs into the lungs every 6 (six) hours as needed for wheezing or shortness of breath.  Marland Kitchen aspirin EC 81 MG tablet Take 81 mg by mouth daily.  Marland Kitchen b complex-vitamin c-folic acid (NEPHRO-VITE) 0.8 MG TABS tablet TAKE 1 TABLET BY MOUTH EVERY DAY  . calcitRIOL (ROCALTROL) 0.25 MCG capsule Take 1 capsule (0.25 mcg total) by mouth every Monday, Wednesday, and Friday with hemodialysis.  Marland Kitchen calcium acetate (PHOSLO) 667 MG capsule Take by mouth 3 (three) times daily with meals. Take 4 capsule with snacks and 8 capsules with meals.  . Cholecalciferol (VITAMIN D3) 5000 UNITS TABS Take 1 tablet by mouth daily.  . cinacalcet (SENSIPAR) 30 MG tablet Take 30 mg by mouth daily. With dinner.    . clonazePAM (KLONOPIN) 0.5 MG tablet Take 1 tablet (0.5 mg total) by mouth 2 (two) times daily.  . diphenhydrAMINE (BENADRYL) 25 MG tablet Take 25 mg by mouth at bedtime as needed for allergies.  Marland Kitchen guaiFENesin (MUCINEX) 600 MG 12 hr tablet Take 600 mg by mouth 2 (two) times daily.  Marland Kitchen guaiFENesin-dextromethorphan (ROBITUSSIN DM) 100-10 MG/5ML syrup Take 10 mLs by mouth every 4 (four) hours as needed for cough.  . hydrALAZINE (APRESOLINE) 50 MG tablet Take 1 tablet (50 mg total) by mouth every 8 (eight) hours.  . metoprolol tartrate (LOPRESSOR) 50 MG tablet Take 75 mg by mouth 2 (two) times daily.  Marland Kitchen omeprazole (PRILOSEC) 20 MG capsule TAKE 1 CAPSULE BY MOUTH 2 TIMES DAILY BEFORE A MEAL.  Marland Kitchen ondansetron (ZOFRAN ODT) 4 MG disintegrating tablet Take 1 tablet (4 mg total) by mouth every 8 (eight) hours as needed for nausea or vomiting.  . pravastatin (PRAVACHOL) 40 MG tablet TAKE 1 TABLET EVERY EVENING  . predniSONE (DELTASONE) 20 MG tablet Take 60mg  x 2 days, 40mg  x 2 days, 20mg  x 3 days  . Probiotic Product (PROBIOTIC-10 PO) Take 1 tablet by mouth daily.  . traMADol (ULTRAM) 50 MG tablet Take 1 tablet (50 mg total) by mouth daily as needed (severe headache).  . budesonide-formoterol (SYMBICORT) 80-4.5 MCG/ACT inhaler Inhale 2 puffs into the lungs 2 (two) times daily.   No facility-administered  encounter medications on file as of 06/25/2017.     Allergies (verified) Codeine   History: Past Medical History:  Diagnosis Date  . Acute edema of lung, unspecified   . Acute, but ill-defined, cerebrovascular disease   . Allergy   . Anemia   . 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)   . Anxiety   . Asthma   . Carpal tunnel syndrome   . Cellulitis and abscess of trunk   . Cholelithiasis 07/13/2014  . Chronic headaches   . Debility, unspecified   . Dermatophytosis of the body   . Dysrhythmia    history of  . Edema   . End stage renal disease on dialysis [facility])    "MWF; [facility] in [facility]" (10/21/2014)  . Essential hypertension, benign   . GERD (gastroesophageal reflux disease)   . Gout, unspecified   . HTN (hypertension)   . Hypertrophy of prostate without urinary obstruction and other lower urinary tract symptoms (LUTS)   . Hypotension, unspecified   . Impotence of organic origin   . Insomnia, unspecified   . Kidney replaced by transplant   . Localization-related (focal) (partial) epilepsy and epileptic syndromes with complex partial seizures, without mention of intractable epilepsy   . Lumbago   . Memory loss   . OSA on CPAP   . Other and unspecified hyperlipidemia   . Other chronic nonalcoholic liver disease   . Other malaise and fatigue   . Other nonspecific abnormal serum enzyme levels   . Pain in joint, lower leg   . Pain in joint, upper arm   . Pneumonia "several times"  . Renal dialysis status(V45.11) 02/05/2010   restarted 01/02/13 ofter renal trransplant failure  . Secondary hyperparathyroidism (of renal origin)   . Shortness of breath   . Sleep apnea   . Tension headache   . Unspecified constipation   . Unspecified essential hypertension   . Unspecified hereditary and idiopathic peripheral neuropathy   . Unspecified vitamin D deficiency    Past Surgical History:  Procedure Laterality Date  . AV FISTULA PLACEMENT Left ?2010   "forearm; at [facility]"  . BACK SURGERY    . CARDIAC CATHETERIZATION  03/21/2011  . CHOLECYSTECTOMY N/A 10/21/2014   Procedure: LAPAROSCOPIC CHOLECYSTECTOMY WITH INTRAOPERATIVE CHOLANGIOGRAM;  Surgeon: Autumn Messing III, MD;  Location: [place];  Service: General;  Laterality: N/A;  . COLONOSCOPY    . 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 KIDNEY TRANSPLANT  08/17/2011   [place]  10/21/2014   w/IOC  . LEFT HEART CATHETERIZATION WITH CORONARY ANGIOGRAM N/A 03/21/2011   Procedure: LEFT HEART CATHETERIZATION WITH CORONARY ANGIOGRAM;  Surgeon: [name], MD;   Location: [facility] CATH LAB;  Service: Cardiovascular;  Laterality: N/A;  . NEPHRECTOMY  08/2013   removed transplaned kidney  . POSTERIOR FUSION CERVICAL SPINE  06/25/2012   for spinal stenosis  . VASECTOMY  2010   Family History  Adopted: Yes  Problem Relation Age of Onset  . Colon cancer Neg Hx   . Esophageal cancer Neg Hx   . Rectal cancer Neg Hx   . Stomach cancer Neg Hx    Social History   Socioeconomic History  . Marital status: Married    Spouse name: [name]  . Number of children: 3  . Years of education: Not on file  . Highest education level: Not on file  Occupational History  . Occupation: disabled  Employer: DISABLED  Social Needs  . Financial resource strain: Not hard at all  . Food insecurity:    Worry: Never true    Inability: Never true  . Transportation needs:    Medical: No    Non-medical: No  Tobacco Use  . Smoking status: Current Every Day Smoker    Packs/day: 0.50    Years: 32.00    Pack years: 16.00    Types: E-cigarettes    Last attempt to quit: 08/23/2014    Years since quitting: 2.8  . Smokeless tobacco: Never Used  . Tobacco comment: 1 cigarette every couple days  Substance and Sexual Activity  . Alcohol use: No    Alcohol/week: 0.0 oz  . Drug use: No  . Sexual activity: Yes  Lifestyle  . Physical activity:    Days per week: 3 days    Minutes per session: 20 min  . Stress: Only a little  Relationships  . Social connections:    Talks on phone: More than three times a week    Gets together: More than three times a week    Attends religious service: More than 4 times per year    Active member of club or organization: No    Attends meetings of clubs or organizations: Never    Relationship status: Married  Other Topics Concern  . Not on file  Social History Narrative   Drinks 1 cup of caffeine daily.   Tobacco Counseling Ready to quit: Not Answered Counseling given: Not Answered Comment: 1 cigarette every couple days   Clinical  Intake:  Pre-visit preparation completed: No  Pain : No/denies pain     Nutritional Risks: None Diabetes: No  How often do you need to have someone help you when you read instructions, pamphlets, or other written materials from your doctor or pharmacy?: 2 - Rarely What is the last grade level you completed in school?: HS  Interpreter Needed?: No  Information entered by :: Tyson Dense, RN  Activities of Daily Living In your present state of health, do you have any difficulty performing the following activities: 06/25/2017 05/31/2017  Hearing? N N  Vision? N N  Difficulty concentrating or making decisions? Y N  Walking or climbing stairs? N N  Dressing or bathing? N N  Doing errands, shopping? N Y  Conservation officer, nature and eating ? N -  Using the Toilet? N -  In the past six months, have you accidently leaked urine? N -  Do you have problems with loss of bowel control? N -  Managing your Medications? N -  Managing your Finances? N -  Housekeeping or managing your Housekeeping? N -  Some recent data might be hidden     Immunizations and Health Maintenance Immunization History  Administered Date(s) Administered  . Influenza Whole 11/01/2010  . Influenza, Seasonal, Injecte, Preservative Fre 10/29/2012  . Influenza-Unspecified 11/21/2011, 11/05/2012, 09/22/2013  . PPD Test 12/26/2012  . Pneumococcal Polysaccharide-23 11/01/2010, 10/29/2012  . Pneumococcal-Unspecified 11/05/2012  . Tdap 01/22/2006   Health Maintenance Due  Topic Date Due  . OPHTHALMOLOGY EXAM  01/29/1972  . FOOT EXAM  04/29/2014  . PNEUMOCOCCAL POLYSACCHARIDE VACCINE (2) 11/01/2015  . TETANUS/TDAP  01/23/2016  . URINE MICROALBUMIN  02/14/2017  . HEMOGLOBIN A1C  05/03/2017    Patient Care Team: Gayland Curry, DO as PCP - General (Geriatric Medicine) Jovita Kussmaul, MD as Consulting Physician (General Surgery) [name], MD as Consulting Physician (Pulmonary Disease) Estanislado Emms, MD as  Consulting  Physician (Nephrology) Elam Dutch, MD as Consulting Physician (Vascular Surgery) [name], MD as Consulting Physician (Cardiology) Milus Banister, MD as Attending Physician (Gastroenterology)  Indicate any recent Medical Services you may have received from other than [facility] providers in the past year (date may be approximate).    Assessment:   This is a routine wellness examination for [place].  Hearing/Vision screen Hearing Screening Comments: Patient declined hearing screen Vision Screening Comments: Does not see eye doctor  Dietary issues and exercise activities discussed: Current Exercise Habits: Home exercise routine, Type of exercise: walking, Time (Minutes): 20, Frequency (Times/Week): 3, Weekly Exercise (Minutes/Week): 60, Intensity: Mild, Exercise limited by: None identified  Goals    None     Depression Screen PHQ 2/9 Scores 06/25/2017 05/30/2017 10/08/2016 06/29/2015  PHQ - 2 Score 3 0 0 0  PHQ- 9 Score 9 - - -    Fall Risk Fall Risk  06/25/2017 05/30/2017 10/08/2016 06/21/2016 06/29/2015  Falls in the past year? No No No No No  Number falls in past yr: - - - - -  Injury with Fall? - - - - -    Is the patient's home free of loose throw rugs in walkways, pet beds, electrical cords, etc?   yes      Grab bars in the bathroom? no      Handrails on the stairs?   yes      Adequate lighting?   yes   Cognitive Function: within normal function        Screening Tests Health Maintenance  Topic Date Due  . OPHTHALMOLOGY EXAM  01/29/1972  . FOOT EXAM  04/29/2014  . PNEUMOCOCCAL POLYSACCHARIDE VACCINE (2) 11/01/2015  . TETANUS/TDAP  01/23/2016  . URINE MICROALBUMIN  02/14/2017  . HEMOGLOBIN A1C  05/03/2017  . INFLUENZA VACCINE  08/22/2017  . COLONOSCOPY  08/30/2019  . Hepatitis C Screening  Completed  . HIV Screening  Completed    Qualifies for Shingles Vaccine? Due, patient wants to think abou tit  Cancer Screenings: Lung: Low Dose CT Chest recommended if Age  73-80 years, 30 pack-year currently smoking OR have quit w/in 15years. Patient does qualify. Colorectal: up to date  Additional Screenings:  Hepatitis C Screening: declined TDAP possibly due-family wants to check with records at home       Plan:    I have personally reviewed and addressed the Medicare Annual Wellness questionnaire and have noted the following in the patient's chart:  A. Medical and social history B. Use of alcohol, tobacco or illicit drugs  C. Current medications and supplements D. Functional ability and status E.  Nutritional status F.  Physical activity G. Advance directives H. List of other physicians I.  Hospitalizations, surgeries, and ER visits in previous 12 months J.  [place] to include hearing, vision, cognitive, depression L. Referrals and appointments - none  In addition, I have reviewed and discussed with patient certain preventive protocols, quality metrics, and best practice recommendations. A written personalized care plan for preventive services as well as general preventive health recommendations were provided to patient.  See attached scanned questionnaire for additional information.   Signed,   Tyson Dense, RN Nurse Health Advisor  Patient Concerns: 3 tick bites on back. Requested Nicotine patch prescription

## 2017-06-25 NOTE — Patient Instructions (Signed)
Kirk [name] , Thank you for taking time to come for your Medicare Wellness Visit. I appreciate your ongoing commitment to your health goals. Please review the following plan we discussed and let me know if I can assist you in the future.   Screening recommendations/referrals: Colonoscopy up to date, due 08/30/2019 Recommended yearly ophthalmology/optometry visit for glaucoma screening and checkup Recommended yearly dental visit for hygiene and checkup  Vaccinations: Influenza vaccine up to date, due 2019 fall season Pneumococcal vaccine up to date, due at age 25 Tdap vaccine due, please check to see if you have had since 2008. Please give [place] a call with the date. If you have not had it you can call your pharmacy and get it there Shingles vaccine due, please get from pharmacy if you decide to get it    Advanced directives: Advance directive discussed with you today. I have provided a copy for you to complete at home and have notarized. Once this is complete please bring a copy in to our office so we can scan it into your chart.  Conditions/risks identified: none  Next appointment: Tyson Dense, RN 07/01/2018 @ 1:45pm  Preventive Care 40-64 Years, Male Preventive care refers to lifestyle choices and visits with your health care provider that can promote health and wellness. What does preventive care include?  A yearly physical exam. This is also called an annual well check.  Dental exams once or twice a year.  Routine eye exams. Ask your health care provider how often you should have your eyes checked.  Personal lifestyle choices, including:  Daily care of your teeth and gums.  Regular physical activity.  Eating a healthy diet.  Avoiding tobacco and drug use.  Limiting alcohol use.  Practicing safe sex.  Taking low-dose aspirin every day starting at age 19. What happens during an annual well check? The services and screenings done by your health care provider during your  annual well check will depend on your age, overall health, lifestyle risk factors, and family history of disease. Counseling  Your health care provider may ask you questions about your:  Alcohol use.  Tobacco use.  Drug use.  Emotional well-being.  Home and relationship well-being.  Sexual activity.  Eating habits.  Work and work Statistician. Screening  You may have the following tests or measurements:  Height, weight, and BMI.  Blood pressure.  Lipid and cholesterol levels. These may be checked every 5 years, or more frequently if you are over 27 years old.  Skin check.  Lung cancer screening. You may have this screening every year starting at age 61 if you have a 30-pack-year history of smoking and currently smoke or have quit within the past 15 years.  Fecal occult blood test (FOBT) of the stool. You may have this test every year starting at age 35.  Flexible sigmoidoscopy or colonoscopy. You may have a sigmoidoscopy every 5 years or a colonoscopy every 10 years starting at age 33.  Prostate cancer screening. Recommendations will vary depending on your family history and other risks.  Hepatitis C blood test.  Hepatitis B blood test.  Sexually transmitted disease (STD) testing.  Diabetes screening. This is done by checking your blood sugar (glucose) after you have not eaten for a while (fasting). You may have this done every 1-3 years. Discuss your test results, treatment options, and if necessary, the need for more tests with your health care provider. Vaccines  Your health care provider may recommend certain vaccines, such as:  Influenza vaccine. This is recommended every year.  Tetanus, diphtheria, and acellular pertussis (Tdap, Td) vaccine. You may need a Td booster every 10 years.  Zoster vaccine. You may need this after age 19.  Pneumococcal 13-valent conjugate (PCV13) vaccine. You may need this if you have certain conditions and have not been  vaccinated.  Pneumococcal polysaccharide (PPSV23) vaccine. You may need one or two doses if you smoke cigarettes or if you have certain conditions. Talk to your health care provider about which screenings and vaccines you need and how often you need them. This information is not intended to replace advice given to you by your health care provider. Make sure you discuss any questions you have with your health care provider. Document Released: 02/04/2015 Document Revised: 09/28/2015 Document Reviewed: 11/09/2014 Elsevier Interactive Patient Education  2017 [place] Prevention in the Home Falls can cause injuries. They can happen to people of all ages. There are many things you can do to make your home safe and to help prevent falls. What can I do on the outside of my home?  Regularly fix the edges of walkways and driveways and fix any cracks.  Remove anything that might make you trip as you walk through a door, such as a raised step or threshold.  Trim any bushes or trees on the path to your home.  Use bright outdoor lighting.  Clear any walking paths of anything that might make someone trip, such as rocks or tools.  Regularly check to see if handrails are loose or broken. Make sure that both sides of any steps have handrails.  Any raised decks and porches should have guardrails on the edges.  Have any leaves, snow, or ice cleared regularly.  Use sand or salt on walking paths during winter.  Clean up any spills in your garage right away. This includes oil or grease spills. What can I do in the bathroom?  Use night lights.  Install grab bars by the toilet and in the tub and shower. Do not use towel bars as grab bars.  Use non-skid mats or decals in the tub or shower.  If you need to sit down in the shower, use a plastic, non-slip stool.  Keep the floor dry. Clean up any water that spills on the floor as soon as it happens.  Remove soap buildup in the tub or shower  regularly.  Attach bath mats securely with double-sided non-slip rug tape.  Do not have throw rugs and other things on the floor that can make you trip. What can I do in the bedroom?  Use night lights.  Make sure that you have a light by your bed that is easy to reach.  Do not use any sheets or blankets that are too big for your bed. They should not hang down onto the floor.  Have a firm chair that has side arms. You can use this for support while you get dressed.  Do not have throw rugs and other things on the floor that can make you trip. What can I do in the kitchen?  Clean up any spills right away.  Avoid walking on wet floors.  Keep items that you use a lot in easy-to-reach places.  If you need to reach something above you, use a strong step stool that has a grab bar.  Keep electrical cords out of the way.  Do not use floor polish or wax that makes floors slippery. If you must use wax,  use non-skid floor wax.  Do not have throw rugs and other things on the floor that can make you trip. What can I do with my stairs?  Do not leave any items on the stairs.  Make sure that there are handrails on both sides of the stairs and use them. Fix handrails that are broken or loose. Make sure that handrails are as long as the stairways.  Check any carpeting to make sure that it is firmly attached to the stairs. Fix any carpet that is loose or worn.  Avoid having throw rugs at the top or bottom of the stairs. If you do have throw rugs, attach them to the floor with carpet tape.  Make sure that you have a light switch at the top of the stairs and the bottom of the stairs. If you do not have them, ask someone to add them for you. What else can I do to help prevent falls?  Wear shoes that:  Do not have high heels.  Have rubber bottoms.  Are comfortable and fit you well.  Are closed at the toe. Do not wear sandals.  If you use a stepladder:  Make sure that it is fully  opened. Do not climb a closed stepladder.  Make sure that both sides of the stepladder are locked into place.  Ask someone to hold it for you, if possible.  Clearly mark and make sure that you can see:  Any grab bars or handrails.  First and last steps.  Where the edge of each step is.  Use tools that help you move around (mobility aids) if they are needed. These include:  Canes.  Walkers.  Scooters.  Crutches.  Turn on the lights when you go into a dark area. Replace any light bulbs as soon as they burn out.  Set up your furniture so you have a clear path. Avoid moving your furniture around.  If any of your floors are uneven, fix them.  If there are any pets around you, be aware of where they are.  Review your medicines with your doctor. Some medicines can make you feel dizzy. This can increase your chance of falling. Ask your doctor what other things that you can do to help prevent falls. This information is not intended to replace advice given to you by your health care provider. Make sure you discuss any questions you have with your health care provider. Document Released: 11/04/2008 Document Revised: 06/16/2015 Document Reviewed: 02/12/2014 Elsevier Interactive Patient Education  2017 [employer].

## 2017-06-25 NOTE — Patient Instructions (Addendum)
Call and verify your inhaler with [place].   Stool sample  Get labs sent from dialysis  Follow up in 2 months with Dr Mariea Clonts

## 2017-06-26 ENCOUNTER — Telehealth: Payer: Self-pay | Admitting: *Deleted

## 2017-06-26 DIAGNOSIS — N2581 Secondary hyperparathyroidism of renal origin: Secondary | ICD-10-CM | POA: Diagnosis not present

## 2017-06-26 DIAGNOSIS — N186 End stage renal disease: Secondary | ICD-10-CM | POA: Diagnosis not present

## 2017-06-26 DIAGNOSIS — D631 Anemia in chronic kidney disease: Secondary | ICD-10-CM | POA: Diagnosis not present

## 2017-06-26 DIAGNOSIS — E8770 Fluid overload, unspecified: Secondary | ICD-10-CM | POA: Diagnosis not present

## 2017-06-26 NOTE — Telephone Encounter (Signed)
Tried calling, mailbox is full, cannot leave message. Will try again later.

## 2017-06-26 NOTE — Telephone Encounter (Signed)
Patient wife, [name] called and stated that you wanted her to call you back regarding what inhaler patient has: Ventolin HFA 52mcg Two puffs by mouth every 6 hours as needed for wheezing/SOB.

## 2017-06-26 NOTE — Telephone Encounter (Signed)
Okay he should ALSO be taking BUDESONIDE-FORMOTEROL (SYMBICORT) 80-4.5 MCG/ACT INHALER-- every day-- to Inhale 2 puffs into the lungs 2 (two) times daily

## 2017-06-28 ENCOUNTER — Emergency Department (HOSPITAL_COMMUNITY)
Admission: EM | Admit: 2017-06-28 | Discharge: 2017-06-28 | Disposition: A | Discharge status: Home / Self Care | Payer: Medicare Other | Attending: Physician Assistant | Admitting: Physician Assistant

## 2017-06-28 ENCOUNTER — Other Ambulatory Visit: Payer: Self-pay

## 2017-06-28 ENCOUNTER — Encounter (HOSPITAL_COMMUNITY): Payer: Self-pay | Admitting: *Deleted

## 2017-06-28 DIAGNOSIS — N2581 Secondary hyperparathyroidism of renal origin: Secondary | ICD-10-CM | POA: Diagnosis not present

## 2017-06-28 DIAGNOSIS — Z940 Kidney transplant status: Secondary | ICD-10-CM | POA: Insufficient documentation

## 2017-06-28 DIAGNOSIS — N186 End stage renal disease: Secondary | ICD-10-CM | POA: Insufficient documentation

## 2017-06-28 DIAGNOSIS — F17210 Nicotine dependence, cigarettes, uncomplicated: Secondary | ICD-10-CM | POA: Insufficient documentation

## 2017-06-28 DIAGNOSIS — I120 Hypertensive chronic kidney disease with stage 5 chronic kidney disease or end stage renal disease: Secondary | ICD-10-CM | POA: Insufficient documentation

## 2017-06-28 DIAGNOSIS — Z7982 Long term (current) use of aspirin: Secondary | ICD-10-CM | POA: Diagnosis not present

## 2017-06-28 DIAGNOSIS — R197 Diarrhea, unspecified: Secondary | ICD-10-CM | POA: Diagnosis not present

## 2017-06-28 DIAGNOSIS — D631 Anemia in chronic kidney disease: Secondary | ICD-10-CM | POA: Diagnosis not present

## 2017-06-28 DIAGNOSIS — E8770 Fluid overload, unspecified: Secondary | ICD-10-CM | POA: Diagnosis not present

## 2017-06-28 LAB — CBC
HCT: 36.4 % — ABNORMAL LOW (ref 39.0–52.0)
Hemoglobin: 11.7 g/dL — ABNORMAL LOW (ref 13.0–17.0)
MCH: 31.7 pg (ref 26.0–34.0)
MCHC: 32.1 g/dL (ref 30.0–36.0)
MCV: 98.6 fL (ref 78.0–100.0)
PLATELETS: 170 10*3/uL (ref 150–400)
RBC: 3.69 MIL/uL — ABNORMAL LOW (ref 4.22–5.81)
RDW: 14.8 % (ref 11.5–15.5)
WBC: 8.4 10*3/uL (ref 4.0–10.5)

## 2017-06-28 LAB — COMPREHENSIVE METABOLIC PANEL
ALK PHOS: 74 U/L (ref 38–126)
ALT: 21 U/L (ref 17–63)
AST: 19 U/L (ref 15–41)
Albumin: 3.9 g/dL (ref 3.5–5.0)
Anion gap: 14 (ref 5–15)
BUN: 17 mg/dL (ref 6–20)
CO2: 23 mmol/L (ref 22–32)
CREATININE: 7.68 mg/dL — AB (ref 0.61–1.24)
Calcium: 9.3 mg/dL (ref 8.9–10.3)
Chloride: 100 mmol/L — ABNORMAL LOW (ref 101–111)
GFR calc Af Amer: 8 mL/min — ABNORMAL LOW (ref >60)
GFR, EST NON AFRICAN AMERICAN: 7 mL/min — AB (ref >60)
Glucose, Bld: 171 mg/dL — ABNORMAL HIGH (ref 65–99)
Potassium: 3.6 mmol/L (ref 3.5–5.1)
Sodium: 137 mmol/L (ref 135–145)
Total Bilirubin: 0.8 mg/dL (ref 0.3–1.2)
Total Protein: 8 g/dL (ref 6.5–8.1)

## 2017-06-28 LAB — C DIFFICILE QUICK SCREEN W PCR REFLEX
C DIFFICILE (CDIFF) INTERP: NOT DETECTED
C DIFFICILE (CDIFF) TOXIN: NEGATIVE
C Diff antigen: NEGATIVE

## 2017-06-28 LAB — LIPASE, BLOOD: Lipase: 94 U/L — ABNORMAL HIGH (ref 11–51)

## 2017-06-28 MED ORDER — GI COCKTAIL ~~LOC~~
30.0000 mL | Freq: Once | ORAL | Status: AC
Start: 2017-06-28 — End: 2017-06-28
  Administered 2017-06-28: 30 mL via ORAL
  Filled 2017-06-28: qty 30

## 2017-06-28 MED ORDER — DICYCLOMINE HCL 10 MG PO CAPS
10.0000 mg | ORAL_CAPSULE | Freq: Once | ORAL | Status: AC
  Administered 2017-06-28: 10 mg via ORAL
  Filled 2017-06-28: qty 1

## 2017-06-28 MED ORDER — ONDANSETRON 4 MG PO TBDP
ORAL_TABLET | ORAL | Status: AC
  Filled 2017-06-28: qty 2

## 2017-06-28 MED ORDER — ONDANSETRON 4 MG PO TBDP
8.0000 mg | ORAL_TABLET | Freq: Once | ORAL | Status: AC
Start: 2017-06-28 — End: 2017-06-28
  Administered 2017-06-28: 8 mg via ORAL
  Filled 2017-06-28: qty 2

## 2017-06-28 MED ORDER — METRONIDAZOLE 500 MG PO TABS: 500.0000 mg | ORAL_TABLET | Freq: Two times a day (BID) | ORAL | 0 refills | Status: DC

## 2017-06-28 NOTE — ED Provider Notes (Signed)
Patient placed in Quick Look pathway, seen and evaluated   Chief Complaint: Diarrhea  HPI:   57 year old male with ESRD presents with diarrhea and weakness. He states symptoms have been present for 5 days. He saw his PCP 4 days ago and they ordered stool studies but he wasn't able to give a sample. He reports some periumbilical abdominal pain, nausea, and up to 20 episodes of water diarrhea a day. He also has had decreased PO intake and generalized weakness. He went to dialysis today and completed the session.  ROS: +diarrhea  Physical Exam:   Gen: No distress. Appears fatigued  Neuro: Awake and Alert  Skin: Warm    Focused Exam: Heart: Regular rate and rhythm    Lungs: CTA    Abdomen: Soft, nontender.    LUE: Palpable thrill   Initiation of care has begun. The patient has been counseled on the process, plan, and necessity for staying for the completion/evaluation, and the remainder of the medical screening examination    Recardo Evangelist, PA-C 06/28/17 [place], [facility], MD 06/29/17 [phone]

## 2017-06-28 NOTE — ED Provider Notes (Signed)
[facility] EMERGENCY DEPARTMENT Provider Note   CSN: [id number] Arrival date & time: 06/28/17  1653     History   Chief Complaint Chief Complaint  Patient presents with  . Diarrhea    HPI KYLEE NARDOZZI is a 57 y.o. male.  HPI   Patient is a 57 year old male end-stage renal disease on dialysis.  Patient reports 5 to 6 days of diarrhea.  He reports he saw his primary care physician but on the day that he saw the primary care physician he felt like his diarrhea was better.  He is unable to produce stool sample he reports that that is gotten a little bit worse today.  Patient reports no vomiting, mild epic gastric discomfort with GERD-like symptoms.  Occasional grumbling of his stomach.  Patient denies any blood in his stool.  He reports that he is going roughly 20-25 times a day.  Of note patient has history of C. difficile.  He reports he "smells the same".  Past Medical History:  Diagnosis Date  . Acute edema of lung, unspecified   . Acute, but ill-defined, cerebrovascular disease   . Allergy   . Anemia   . 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)   . Anxiety   . Asthma   . Carpal tunnel syndrome   . Cellulitis and abscess of trunk   . Cholelithiasis 07/13/2014  . Chronic headaches   . Debility, unspecified   . Dermatophytosis of the body   . Dysrhythmia    history of  . Edema   . End stage renal disease on dialysis [facility])    "MWF; [facility] [place]" (10/21/2014)  . Essential hypertension, benign   . GERD (gastroesophageal reflux disease)   . Gout, unspecified   . HTN (hypertension)   . Hypertrophy of prostate without urinary obstruction and other lower urinary tract symptoms (LUTS)   . Hypotension, unspecified   . Impotence of organic origin   . Insomnia, unspecified   . Kidney replaced by transplant   . Localization-related (focal) (partial) epilepsy and epileptic syndromes with complex partial seizures, without mention of intractable  epilepsy   . Lumbago   . Memory loss   . OSA on CPAP   . Other and unspecified hyperlipidemia   . Other chronic nonalcoholic liver disease   . Other malaise and fatigue   . Other nonspecific abnormal serum enzyme levels   . Pain in joint, lower leg   . Pain in joint, upper arm   . Pneumonia "several times"  . Renal dialysis status(V45.11) 02/05/2010   restarted 01/02/13 ofter renal trransplant failure  . Secondary hyperparathyroidism (of renal origin)   . Shortness of breath   . Sleep apnea   . Tension headache   . Unspecified constipation   . Unspecified essential hypertension   . Unspecified hereditary and idiopathic peripheral neuropathy   . Unspecified vitamin D deficiency     Patient Active Problem List   Diagnosis Date Noted  . ESRD (end stage renal disease) ([place]) 05/30/2017  . Anxiety 05/30/2017  . HCAP (healthcare-associated pneumonia) 05/30/2017  . Acute respiratory failure with hypoxia ([place]) 01/28/2017  . 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  . Acute respiratory distress 10/31/2016  . Shunt malfunction 07/03/2016  . End stage renal disease ([place])   . Need for hepatitis C screening test 06/29/2015  . Fatigue 06/29/2015  . Myalgia 05/17/2015  . 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 Obesity hypoventilation syndrome ([place]) 12/09/2014  . Extrinsic asthma 12/09/2014  .  Narcotic drug use 12/09/2014  . Hypersomnia with sleep apnea 12/09/2014  . SCC (squamous cell carcinoma), arm 11/09/2014  . Mild persistent chronic asthma without complication 41/66/0630  . ESRD on dialysis ([place]) 09/09/2014  . HLD (hyperlipidemia) 09/09/2014  . Dyspnea 06/23/2014  . Pancytopenia ([place]) 04/28/2014  . Thrombocytopenia ([place]) 04/10/2014  . Fungal dermatitis 03/17/2014  . Abdominal pain   . History of surgical procedure 09/17/2013  . Awaiting organ transplant 02/13/2013  . HTN (hypertension)   . Tension headache   . Memory loss   . Anemia in chronic renal disease   . Edema   . Thoracic  or lumbosacral neuritis or radiculitis 03/27/2012  . Nerve root pain 03/27/2012  . History of kidney transplant 09/10/2011  . Hearing loss 08/16/2011  . Obesity 08/16/2011  . Difficulty hearing 08/16/2011  . OSA on CPAP 08/05/2011    Past Surgical History:  Procedure Laterality Date  . AV FISTULA PLACEMENT Left ?2010   "forearm; at [facility]"  . BACK SURGERY    . CARDIAC CATHETERIZATION  03/21/2011  . CHOLECYSTECTOMY N/A 10/21/2014   Procedure: LAPAROSCOPIC CHOLECYSTECTOMY WITH INTRAOPERATIVE CHOLANGIOGRAM;  Surgeon: Autumn Messing III, MD;  Location: [facility];  Service: General;  Laterality: N/A;  . COLONOSCOPY    . 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 KIDNEY TRANSPLANT  08/17/2011   [place]  10/21/2014   w/IOC  . LEFT HEART CATHETERIZATION WITH CORONARY ANGIOGRAM N/A 03/21/2011   Procedure: LEFT HEART CATHETERIZATION WITH CORONARY ANGIOGRAM;  Surgeon: [name], MD;  Location: [name] C[facility] LAB;  Service: Cardiovascular;  Laterality: N/A;  . NEPHRECTOMY  08/2013   removed transplaned kidney  . POSTERIOR FUSION CERVICAL SPINE  06/25/2012   for spinal stenosis  . VASECTOMY  2010        Home Medications    Prior to Admission medications   Medication Sig Start Date End Date Taking? Authorizing Provider  acetaminophen (TYLENOL) 500 MG tablet Take 1 tablet (500 mg total) by mouth every 6 (six) hours as needed for moderate pain. 09/12/14  Yes Samuella Cota, MD  albuterol (PROVENTIL HFA;VENTOLIN HFA) 108 (90 Base) MCG/ACT inhaler Inhale 2 puffs into the lungs every 6 (six) hours as needed for wheezing or shortness of breath. 06/05/17  Yes Patrecia Pour, MD  aspirin EC 81 MG tablet Take 81 mg by mouth daily.   Yes [provider]  b complex-vitamin c-folic acid (NEPHRO-VITE) 0.8 MG TABS tablet TAKE 1 TABLET BY MOUTH EVERY DAY 12/03/16  Yes Reed, Tiffany L, DO  budesonide-formoterol (SYMBICORT) 80-4.5 MCG/ACT  inhaler Inhale 2 puffs into the lungs 2 (two) times daily. 01/30/17  Yes Shahmehdi, Valeria Batman, MD  calcitRIOL (ROCALTROL) 0.25 MCG capsule Take 1 capsule (0.25 mcg total) by mouth every Monday, Wednesday, and Friday with hemodialysis. 09/21/14  Yes Bonnielee Haff, MD  calcium acetate (PHOSLO) 667 MG capsule Take [phone] mg by mouth See admin instructions. Take 5,336 mg by mouth three times a day with meals and 2,668 mg with each snack   Yes [provider]  Cholecalciferol (VITAMIN D3) 5000 UNITS TABS Take 1 tablet by mouth daily.   Yes [provider]  cinacalcet (SENSIPAR) 30 MG tablet Take 30 mg by mouth daily. With dinner.   Yes [provider]  clonazePAM (KLONOPIN) 0.5 MG tablet Take 1 tablet (0.5 mg total) by mouth daily as needed for anxiety. 06/25/17  Yes Lauree Chandler, NP  diphenhydrAMINE (BENADRYL) 25 MG tablet Take 25 mg by mouth at bedtime as needed for allergies.   Yes [provider]  guaiFENesin (MUCINEX) 600 MG 12 hr tablet Take 600 mg by mouth 2 (two) times daily.   Yes [provider]  guaiFENesin-dextromethorphan (ROBITUSSIN DM) 100-10 MG/5ML syrup Take 10 mLs by mouth every 4 (four) hours as needed for cough.   Yes [provider]  hydrALAZINE (APRESOLINE) 50 MG tablet Take 1 tablet (50 mg total) by mouth every 8 (eight) hours. 09/22/16  Yes Orvan Falconer, MD  metoprolol tartrate (LOPRESSOR) 50 MG tablet Take 75 mg by mouth 2 (two) times daily.   Yes [provider]  omeprazole (PRILOSEC) 20 MG capsule TAKE 1 CAPSULE BY MOUTH 2 TIMES DAILY BEFORE A MEAL. 12/26/16  Yes Milus Banister, MD  ondansetron (ZOFRAN ODT) 4 MG disintegrating tablet Take 1 tablet (4 mg total) by mouth every 8 (eight) hours as needed for nausea or vomiting. 10/26/16  Yes Carmon Sails J, PA-C  pravastatin (PRAVACHOL) 40 MG tablet TAKE 1 TABLET EVERY EVENING 01/03/17  Yes Reed, Tiffany L, DO  Probiotic Product (PROBIOTIC-10 PO) Take 1 capsule by  mouth daily.    Yes [provider]  traMADol (ULTRAM) 50 MG tablet Take 1 tablet (50 mg total) by mouth daily as needed (severe headache). Patient taking differently: Take 50 mg by mouth daily as needed (for severe headaches).  06/25/17  Yes Eubanks, Carlos American, NP  UNABLE TO FIND CPAP: During all periods of rest (naps and at bedtime)   Yes [provider]  metroNIDAZOLE (FLAGYL) 500 MG tablet Take 1 tablet (500 mg total) by mouth 2 (two) times daily. 06/28/17   Dare Spillman Lyn, MD  predniSONE (DELTASONE) 20 MG tablet Take 60mg  x 2 days, 40mg  x 2 days, 20mg  x 3 days Patient not taking: Reported on 06/28/2017 06/05/17   Patrecia Pour, MD    Family History Family History  Adopted: Yes  Problem Relation Age of Onset  . Colon cancer Neg Hx   . Esophageal cancer Neg Hx   . Rectal cancer Neg Hx   . Stomach cancer Neg Hx     Social History Social History   Tobacco Use  . Smoking status: Current Every Day Smoker    Packs/day: 0.50    Years: 32.00    Pack years: 16.00    Types: E-cigarettes    Last attempt to quit: 08/23/2014    Years since quitting: 2.8  . Smokeless tobacco: Never Used  . Tobacco comment: 1 cigarette every couple days  Substance Use Topics  . Alcohol use: No    Alcohol/week: 0.0 oz  . Drug use: No     Allergies   Codeine   Review of Systems Review of Systems  Constitutional: Negative for activity change, fatigue and fever.  Respiratory: Negative for shortness of breath.   Cardiovascular: Negative for chest pain.  Gastrointestinal: Positive for diarrhea and nausea. Negative for abdominal pain, blood in stool and vomiting.  All other systems reviewed and are negative.    Physical Exam Updated Vital Signs BP 137/64   Pulse 73   Temp 98.6 F (37 C) (Oral)   Resp 20   Ht 5\' 7"  (1.702 m)   Wt 95.5 kg (210 lb 8.6 oz)   SpO2 100%   BMI 32.98 kg/m   Physical Exam  Constitutional: He is oriented to person, place, and time. He appears  well-nourished.  HENT:  Head: Normocephalic.  Eyes: Conjunctivae are normal.  Cardiovascular: Normal rate and regular rhythm.  Pulmonary/Chest: Effort normal and breath sounds normal. No stridor. No respiratory distress.  Abdominal: Soft. He exhibits distension. He exhibits no mass. There is no tenderness. There is no guarding.  Musculoskeletal:  +fistula  Neurological: He is oriented to person, place, and time.  Skin: Skin is warm and dry. He is not diaphoretic.  Psychiatric: He has a normal mood and affect. His behavior is normal.     ED Treatments / Results  Labs (all labs ordered are listed, but only abnormal results are displayed) Labs Reviewed  LIPASE, BLOOD - Abnormal; Notable for the following components:      Result Value   Lipase 94 (*)    All other components within normal limits  COMPREHENSIVE METABOLIC PANEL - Abnormal; Notable for the following components:   Chloride 100 (*)    Glucose, Bld 171 (*)    Creatinine, Ser 7.68 (*)    GFR calc non Af Amer 7 (*)    GFR calc Af Amer 8 (*)    All other components within normal limits  CBC - Abnormal; Notable for the following components:   RBC 3.69 (*)    Hemoglobin 11.7 (*)    HCT 36.4 (*)    All other components within normal limits  C DIFFICILE QUICK SCREEN W PCR REFLEX  GASTROINTESTINAL PANEL BY PCR, STOOL (REPLACES STOOL CULTURE)    EKG None  Radiology No results found.  Procedures Procedures (including critical care time)  Medications Ordered in ED Medications  ondansetron (ZOFRAN-ODT) disintegrating tablet 8 mg (8 mg Oral Given 06/28/17 2004)  gi cocktail (Maalox,Lidocaine,Donnatal) (30 mLs Oral Given 06/28/17 2046)  dicyclomine (BENTYL) capsule 10 mg (10 mg Oral Given 06/28/17 2047)     Initial Impression / Assessment and Plan / ED Course  I have reviewed the triage vital signs and the nursing notes.  Pertinent labs & imaging results that were available during my care of the patient were reviewed by  me and considered in my medical decision making (see chart for details).     Patient is a 57 year old male end-stage renal disease on dialysis.  Patient reports 5 to 6 days of diarrhea.  He reports he saw his primary care physician but on the day that he saw the primary care physician he felt like his diarrhea was better.  He is unable to produce stool sample he reports that that is gotten a little bit worse today.  Patient reports no vomiting, mild epic gastric discomfort with GERD-like symptoms.  Occasional grumbling of his stomach.  Patient denies any blood in his stool.  He reports that he is going roughly 20-25 times a day.  Of note patient has history of C. difficile.  He reports he "smells the same".  7:35 PM Patient has isolated diarrhea.  No abdominal pain.  Patient's vital signs are are reassuring.  As well as his labs being around baseline.  Could represent C. difficile.  Will send.  We will send PCR panel .  9:47 PM C. difficile negative.  Patient taking p.o. and remains having normal vital signs.  He is stooled twice in the ED.  We will give him metronidazole given that he is end-stage renal disease.  This is likely to help with any kind of bacterial source of diarrhea.  His stool was sent for culture here..  We will have him follow-up with his primary care physician.  Final Clinical Impressions(s) / ED Diagnoses  Final diagnoses:  Diarrhea of presumed infectious origin    ED Discharge Orders        Ordered    metroNIDAZOLE (FLAGYL) 500 MG tablet  2 times daily     06/28/17 2147       Macarthur Critchley, MD 06/28/17 2148

## 2017-06-28 NOTE — ED Triage Notes (Signed)
The pt is here for diarrhea for one week.  He is dialysis and was dialyzed earlier today.  He had a normal bm yesterday but since dialysis he started having diarrhea again this afternoon.   Fistula lt arm

## 2017-06-28 NOTE — Telephone Encounter (Signed)
Tried calling, mailbox is full, cannot leave message.

## 2017-06-28 NOTE — Discharge Instructions (Signed)
You were seen here with diarrhea.  Your C. difficile was negative.  Your labs were reassuring and her vital signs were normal.  Please take p.o. at home.  We are giving this antibiotic to see if it helps.  Please follow-up with your primary care physician to follow-up with the stool studies that were sent off today from the emergency department.

## 2017-06-28 NOTE — ED Notes (Signed)
Pt getting undressed and into gown.

## 2017-06-28 NOTE — ED Notes (Signed)
Pt alert, oriented and stable. BP of 120/70 Reviewed medications and discharge instructions with pt. Pt verbalized understanding.

## 2017-06-29 LAB — GASTROINTESTINAL PANEL BY PCR, STOOL (REPLACES STOOL CULTURE)
Adenovirus F40/41: NOT DETECTED
Astrovirus: NOT DETECTED
Campylobacter species: NOT DETECTED
Cryptosporidium: NOT DETECTED
Cyclospora cayetanensis: NOT DETECTED
ENTAMOEBA HISTOLYTICA: NOT DETECTED
Enteroaggregative E coli (EAEC): NOT DETECTED
Enteropathogenic E coli (EPEC): NOT DETECTED
Enterotoxigenic E coli (ETEC): NOT DETECTED
Giardia lamblia: NOT DETECTED
NOROVIRUS GI/GII: NOT DETECTED
Plesimonas shigelloides: NOT DETECTED
ROTAVIRUS A: NOT DETECTED
SALMONELLA SPECIES: NOT DETECTED
SHIGELLA/ENTEROINVASIVE E COLI (EIEC): NOT DETECTED
Sapovirus (I, II, IV, and V): NOT DETECTED
Shiga like toxin producing E coli (STEC): NOT DETECTED
VIBRIO CHOLERAE: NOT DETECTED
Vibrio species: NOT DETECTED
Yersinia enterocolitica: NOT DETECTED

## 2017-07-01 DIAGNOSIS — N186 End stage renal disease: Secondary | ICD-10-CM | POA: Diagnosis not present

## 2017-07-01 DIAGNOSIS — D631 Anemia in chronic kidney disease: Secondary | ICD-10-CM | POA: Diagnosis not present

## 2017-07-01 DIAGNOSIS — N2581 Secondary hyperparathyroidism of renal origin: Secondary | ICD-10-CM | POA: Diagnosis not present

## 2017-07-01 DIAGNOSIS — E8770 Fluid overload, unspecified: Secondary | ICD-10-CM | POA: Diagnosis not present

## 2017-07-03 DIAGNOSIS — E8770 Fluid overload, unspecified: Secondary | ICD-10-CM | POA: Diagnosis not present

## 2017-07-03 DIAGNOSIS — D631 Anemia in chronic kidney disease: Secondary | ICD-10-CM | POA: Diagnosis not present

## 2017-07-03 DIAGNOSIS — N186 End stage renal disease: Secondary | ICD-10-CM | POA: Diagnosis not present

## 2017-07-03 DIAGNOSIS — N2581 Secondary hyperparathyroidism of renal origin: Secondary | ICD-10-CM | POA: Diagnosis not present

## 2017-07-05 DIAGNOSIS — N2581 Secondary hyperparathyroidism of renal origin: Secondary | ICD-10-CM | POA: Diagnosis not present

## 2017-07-05 DIAGNOSIS — N186 End stage renal disease: Secondary | ICD-10-CM | POA: Diagnosis not present

## 2017-07-05 DIAGNOSIS — E8770 Fluid overload, unspecified: Secondary | ICD-10-CM | POA: Diagnosis not present

## 2017-07-05 DIAGNOSIS — D631 Anemia in chronic kidney disease: Secondary | ICD-10-CM | POA: Diagnosis not present

## 2017-07-08 DIAGNOSIS — E8770 Fluid overload, unspecified: Secondary | ICD-10-CM | POA: Diagnosis not present

## 2017-07-08 DIAGNOSIS — D631 Anemia in chronic kidney disease: Secondary | ICD-10-CM | POA: Diagnosis not present

## 2017-07-08 DIAGNOSIS — N2581 Secondary hyperparathyroidism of renal origin: Secondary | ICD-10-CM | POA: Diagnosis not present

## 2017-07-08 DIAGNOSIS — N186 End stage renal disease: Secondary | ICD-10-CM | POA: Diagnosis not present

## 2017-07-10 DIAGNOSIS — N2581 Secondary hyperparathyroidism of renal origin: Secondary | ICD-10-CM | POA: Diagnosis not present

## 2017-07-10 DIAGNOSIS — E8770 Fluid overload, unspecified: Secondary | ICD-10-CM | POA: Diagnosis not present

## 2017-07-10 DIAGNOSIS — N186 End stage renal disease: Secondary | ICD-10-CM | POA: Diagnosis not present

## 2017-07-10 DIAGNOSIS — D631 Anemia in chronic kidney disease: Secondary | ICD-10-CM | POA: Diagnosis not present

## 2017-07-11 ENCOUNTER — Ambulatory Visit (INDEPENDENT_AMBULATORY_CARE_PROVIDER_SITE_OTHER): Payer: Medicare Other | Admitting: Internal Medicine

## 2017-07-11 ENCOUNTER — Ambulatory Visit (INDEPENDENT_AMBULATORY_CARE_PROVIDER_SITE_OTHER)
Admission: RE | Admit: 2017-07-11 | Discharge: 2017-07-11 | Disposition: A | Discharge status: Home / Self Care | Payer: Medicare Other | Source: Ambulatory Visit | Attending: Internal Medicine | Admitting: Internal Medicine

## 2017-07-11 ENCOUNTER — Encounter: Payer: Self-pay | Admitting: Internal Medicine

## 2017-07-11 VITALS — BP 128/82 | HR 75 | Ht 67.0 in | Wt 217.0 lb

## 2017-07-11 DIAGNOSIS — R0609 Other forms of dyspnea: Secondary | ICD-10-CM

## 2017-07-11 DIAGNOSIS — J189 Pneumonia, unspecified organism: Secondary | ICD-10-CM

## 2017-07-11 DIAGNOSIS — R0602 Shortness of breath: Secondary | ICD-10-CM | POA: Diagnosis not present

## 2017-07-11 DIAGNOSIS — J4530 Mild persistent asthma, uncomplicated: Secondary | ICD-10-CM

## 2017-07-11 DIAGNOSIS — F17210 Nicotine dependence, cigarettes, uncomplicated: Secondary | ICD-10-CM

## 2017-07-11 HISTORY — DX: Nicotine dependence, cigarettes, uncomplicated: F17.210

## 2017-07-11 MED ORDER — BUDESONIDE-FORMOTEROL FUMARATE 80-4.5 MCG/ACT IN AERO: 2.0000 | INHALATION_SPRAY | Freq: Two times a day (BID) | RESPIRATORY_TRACT | 11 refills | Status: DC

## 2017-07-11 MED ORDER — BUDESONIDE-FORMOTEROL FUMARATE 80-4.5 MCG/ACT IN AERO: 2.0000 | INHALATION_SPRAY | Freq: Two times a day (BID) | RESPIRATORY_TRACT | 0 refills | Status: DC

## 2017-07-11 NOTE — Assessment & Plan Note (Addendum)
-   Spirometry 07/11/2017  Restrictive/ min curvature on no rx - 07/11/2017  After extensive coaching inhaler device  effectiveness =    90% from a baseline of < 50%    Despite ongoing smoking he has no significant copd but likely does have mild asthmatic bronchitis so approp to continue symb 80 2bid and try to work hard on no smoking (see separate a/p)    See device teaching which extended face to face time for this visit

## 2017-07-11 NOTE — Progress Notes (Signed)
Subjective:    Patient ID: Kirk [name], male    DOB: 1960-08-15     MRN: [id number]    Brief patient profile:  57 yobm active smoker  referred 10/18/2014 to Kirk [name] for eval of ? Copd for clearance for lap chole  with  no airflow obst on spiromtery   10/18/2014 on HD since 2012    History of Present Illness  10/18/2014 1st Kirk [name] office visit/ Kirk [name]   Chief Complaint  Patient presents with  . Advice Only    refer Dr. Nyoka [name]. Denies any wheezing, no chest tx, no cough. Pt here to be evaluated for COPD.    variable sob with exertion - says can walk fine but no regular walking,  Some sob steps   rec Plan A = Backup=  symbiocrt 80 Take 2 puffs first thing in am and then another 2 puffs about 12 hours later.  Plan B = Back up Only use your combivent  as a rescue medication  Plan C = crisis Only use your nebulizer if you try B first and doesn't work You are cleared for surgery  > tol lap chole fine    Admit date: 04/29/2017 Discharge date: 05/02/2017  Admitted From: Home Disposition:  Home   Recommendations for Outpatient Follow-up:  1. Follow up with PCP in 1-2 weeks 2. Please obtain BMP/CBC in one week   Discharge Condition: Stable CODE STATUS:FULL Diet recommendation: Renal diet   Brief/Interim Summary: 57 year old male with end-stage renal disease on hemodialysis who presents with 48 hours of worsening malaise, associated with productive cough, headache, nausea and poor appetite. On his initial physical examination his temperature is 102.58F, heart rate 98, blood pressure 165/68, respiratory rate 26, oxygen saturation 91-92% on room air. Dry mucous membranes,mild pallor, lungs with bilateral rhonchi, heart S1-S2 present rhythmic, abdomen soft nontender, no lower extremity edema. Sodium 138, potassium 3.1, chloride 94, bicarb 29, glucose 136, BUN 22, creatinine 7.57, white count 5.5,hemoglobin 10.3, hematocrit 31.4, platelets 86.Chest x-ray with a small  patchy infiltrate at the right upper lobe.  Patient wasadmitted to the hospital with a working diagnosis of right upper lobe pneumonia,complicated by sepsis and hypokalemia.The patient was started on intravenous antibiotics. Nephrology was consulted to assist with dialysis.    Discharge Diagnoses:  Sepsis -due to pneumonia -continue ceftriaxone and azithromycin -pt has defervesced--no fever in the last 24 hours -Initially presented with fever of 102.5 F -Blood cultures remain negative -Personally reviewed chest x-ray--mild right upper lobe opacity -most recent temp 98.42F at 1240 on 05/01/17  Healthcare associated pneumonia -Defervesced on ceftriaxone and azithromycin -Check procalcitonin--1.99 -Influenza PCR--neg -Viral respiratory panel--positive coronavirus -home with cefdinir and azithromycin x 4 more days to finish 7 days of tx  Intractable headache -Likely toxic headache from the patient's sepsis -CT brain--neg for acute findings -Continue tramadol as needed severe headache -benadryl and compazine IV x 1 and IV solumedrol-->improved  ESRD -Last received dialysis for 05/01/2017 -Nephrology following -Metabolic bone disease per nephrology  Essential hypertension -Continue metoprolol tartrate and hydralazine -remains high  COPD Exacerbation -mild wheezing on exam-->improved with steroids -start IV solumedrol--home with prednisone x 3 more days -continue bronchodilators -started pulmicort while inpatient -stable on RA      07/11/2017 transition of care post hosp  f/u ov/Kirk [name] re:   Active smoker /AB with restrictive spirometry ? Obesity related  Chief Complaint  Patient presents with  . Hospitalization Follow-up    follow up for PNA in both lungs last month  05/30/17. Per patient, still has a productive cough with clear phlegm. Breathing is getting better.    Dyspnea:  7/11 ok not HT  So not back to basline yet, also limited by fatigue, esp p HD  MWF Cough: since pna coughing noct and early am x up to a tbsp, white mucus assoc with nasal congestion Sleep: up 30 degrees  SABA use:  Both hfa and neb/ confused with when to use    No obvious day to day or daytime variability or assoc purulent sputum or mucus plugs or hemoptysis or cp or chest tightness, subjective wheeze or  hb symptoms. No unusual exposure hx or h/o childhood pna/ asthma or knowledge of premature birth.  Sleeping  30 degrees on 2lpm   without nocturnal    exacerbation  of respiratory  c/o's or need for noct saba. Also denies any obvious fluctuation of symptoms with weather or environmental changes or other aggravating or alleviating factors except as outlined above   Current Allergies, Complete Past Medical History, Past Surgical History, Family History, and Social History were reviewed in [employer] record.  ROS  The following are not active complaints unless bolded Hoarseness, sore throat, dysphagia, dental problems, itching, sneezing,  nasal congestion or discharge of excess mucus or purulent secretions, ear ache,   fever, chills, sweats, unintended wt loss or wt gain, classically pleuritic or exertional cp,  orthopnea pnd or arm/hand swelling  or leg swelling, presyncope, palpitations, abdominal pain, anorexia, nausea, vomiting, diarrhea  or change in bowel habits or change in bladder habits, change in stools or change in urine, dysuria, hematuria,  rash, arthralgias, visual complaints, headache, numbness, weakness or ataxia or problems with walking or coordination,  change in mood or  memory.        Current Meds  Medication Sig  . acetaminophen (TYLENOL) 500 MG tablet Take 1 tablet (500 mg total) by mouth every 6 (six) hours as needed for moderate pain.  Marland Kitchen albuterol (PROVENTIL HFA;VENTOLIN HFA) 108 (90 Base) MCG/ACT inhaler Inhale 2 puffs into the lungs every 6 (six) hours as needed for wheezing or shortness of breath.  Marland Kitchen aspirin EC 81 MG tablet  Take 81 mg by mouth daily.  Marland Kitchen b complex-vitamin c-folic acid (NEPHRO-VITE) 0.8 MG TABS tablet TAKE 1 TABLET BY MOUTH EVERY DAY  . calcitRIOL (ROCALTROL) 0.25 MCG capsule Take 1 capsule (0.25 mcg total) by mouth every Monday, Wednesday, and Friday with hemodialysis.  Marland Kitchen calcium acetate (PHOSLO) 667 MG capsule Take 4,081-4,481 mg by mouth See admin instructions. Take 5,336 mg by mouth three times a day with meals and 2,668 mg with each snack  . Cholecalciferol (VITAMIN D3) 5000 UNITS TABS Take 1 tablet by mouth daily.  . cinacalcet (SENSIPAR) 30 MG tablet Take 30 mg by mouth daily. With dinner.  . clonazePAM (KLONOPIN) 0.5 MG tablet Take 1 tablet (0.5 mg total) by mouth daily as needed for anxiety.  . diphenhydrAMINE (BENADRYL) 25 MG tablet Take 25 mg by mouth at bedtime as needed for allergies.  Marland Kitchen guaiFENesin (MUCINEX) 600 MG 12 hr tablet Take 600 mg by mouth 2 (two) times daily.  Marland Kitchen guaiFENesin-dextromethorphan (ROBITUSSIN DM) 100-10 MG/5ML syrup Take 10 mLs by mouth every 4 (four) hours as needed for cough.  . hydrALAZINE (APRESOLINE) 50 MG tablet Take 1 tablet (50 mg total) by mouth every 8 (eight) hours.  . metoprolol tartrate (LOPRESSOR) 50 MG tablet Take 75 mg by mouth 2 (two) times daily.  Marland Kitchen omeprazole (PRILOSEC) 20  MG capsule TAKE 1 CAPSULE BY MOUTH 2 TIMES DAILY BEFORE A MEAL.  Marland Kitchen ondansetron (ZOFRAN ODT) 4 MG disintegrating tablet Take 1 tablet (4 mg total) by mouth every 8 (eight) hours as needed for nausea or vomiting.  . pravastatin (PRAVACHOL) 40 MG tablet TAKE 1 TABLET EVERY EVENING  . Probiotic Product (PROBIOTIC-10 PO) Take 1 capsule by mouth daily.   . traMADol (ULTRAM) 50 MG tablet Take 1 tablet (50 mg total) by mouth daily as needed (severe headache). (Patient taking differently: Take 50 mg by mouth daily as needed (for severe headaches). )  . UNABLE TO FIND CPAP: During all periods of rest (naps and at bedtime)  . [DISCONTINUED] budesonide-formoterol (SYMBICORT) 80-4.5 MCG/ACT  inhaler Inhale 2 puffs into the lungs 2 (two) times daily.  . [DISCONTINUED] predniSONE (DELTASONE) 20 MG tablet Take 60mg  x 2 days, 40mg  x 2 days, 20mg  x 3 days               Objective:   Physical Exam  amb bm nad   07/11/2017       217   10/18/14 233 lb 12.8 oz (106.051 kg)  10/05/14 235 lb 3.2 oz (106.686 kg)  09/20/14 230 lb 6.1 oz (104.5 kg)    Vital signs reviewed - Note on arrival 02 sats  98% on RA   - just using 02 with HD    HEENT: nl dentition, turbinates bilaterally, and oropharynx. Nl external ear canals without cough reflex   NECK :  without JVD/Nodes/TM/ nl carotid upstrokes bilaterally   LUNGS: no acc muscle use,  Nl contour chest which is clear to A and P bilaterally without cough on insp or exp maneuvers   CV:  RRR  no s3 or murmur or increase in P2, and no edema   ABD:  Quite obese  nontender with limited inspiratory excursion in the supine position. No bruits or organomegaly appreciated, bowel sounds nl  MS:  Nl gait/ ext warm without deformities, calf tenderness, cyanosis or clubbing No obvious joint restrictions   SKIN: warm and dry without lesions    NEURO:  alert, approp, nl sensorium with  no motor or cerebellar deficits apparent.        I personally reviewed images and agree with radiology impression as follows:   Chest CT 05/30/17 1. No CT evidence of central [name] artery embolus. 2. Bilateral lower lobe consolidative changes consistent with pneumonia. Clinical correlation and follow-up to resolution recommended. 3. Mild cardiomegaly and coronary vascular calcification.    CXR PA and Lateral:   07/11/2017 :    I personally reviewed images and agree with radiology impression as follows:   Stable cardiomegaly.  COPD. Persistent posterior lung base consolidation versus confluent osseous shadows.  My impression:  Marked improvement since Ct chest 05/30/17 esp looking at the lateral views     Assessment & Plan:

## 2017-07-11 NOTE — Assessment & Plan Note (Addendum)
10/18/2014  Walked RA x 3 laps @ 185 ft each stopped due to End of study, nl pace, no  desat  - min sob  - spirometry 10/18/2014 neg for obst  - Spirometry 07/11/2017  Mod restriction/ min curvature with no rx prior  -  07/11/2017  Walked RA x 3 laps @ 185 ft each stopped due to  End of study, nl pace, no sob or desat   Symptoms are markedly disproportionate to objective findings and not clear to what extent this is actually a pulmonary  problem but pt does appear to have difficult to sort out respiratory symptoms of unknown origin for which  DDX  = almost all start with A and  include Adherence, Ace Inhibitors, Acid Reflux, Active Sinus Disease, Alpha 1 Antitripsin deficiency, Anxiety masquerading as Airways dz,  ABPA,  Allergy(esp in young), Aspiration (esp in elderly), Adverse effects of meds,  Active smokers, A bunch of PE's/clot burden (a few small clots can't cause this syndrome unless there is already severe underlying pulm or vascular dz with poor reserve),  Anemia or thyroid disorder, plus two Bs  = Bronchiectasis and Beta blocker use..and one C= CHF     Adherence is always the initial "prime suspect" and is a multilayered concern that requires a "trust but verify" approach in every patient - starting with knowing how to use medications, especially inhalers, correctly, keeping up with refills and understanding the fundamental difference between maintenance and prns vs those medications only taken for a very short course and then stopped and not refilled.  - see hfa teaching - return with all meds in hand using a trust but verify approach to confirm accurate Medication  Reconciliation The principal here is that until we are certain that the  patients are doing what we've asked, it makes no sense to ask them to do more.    Active smoking (see separate a/p)   ? Anxiety/depression/ deconditioning  > usually at the bottom of this list of usual suspects but should be much higher on this pt's based on  H and P and note already on psychotropics and may interfere with adherence and also interpretation of response or lack thereof to symptom management which can be quite subjective.   ? Allergy/ asthma > doubt, low dose ics should be adequate here     ? Sinus dz >  See head ct 05/01/17 = nl sinuses  ? BB effects > In the setting of respiratory symptoms of unknown etiology,  It would be preferable to use bystolic, the most beta -1  selective Beta blocker available in sample form, with bisoprolol the most selective generic choice  on the market, at least on a trial basis, to make sure the spillover Beta 2 effects of the less specific Beta blockers are not contributing to this patient's symptoms.  Since could not demonstrate any significant airflow obst on today's spirometry will leave on present dose of lopressor and repeat full pfts next ov   ? chf > doubt as says he feels the best prior to HD and much worse p HD chronically = washed out and doesn't help his breathing.   I had an extended discussion with the patient reviewing all relevant studies completed to date and  lasting 25 minutes of a 40  minute transition of care post hosp f/u office visit with pt not familiar to me as last seen almost 3 full years prior to [facility]     re  severe non-specific  but potentially very serious refractory respiratory symptoms of uncertain and potentially multiple  etiologies.   Each maintenance medication was reviewed in detail including most importantly the difference between maintenance and prns and under what circumstances the prns are to be triggered using an action plan format that is not reflected in the computer generated alphabetically organized AVS.    Please see AVS for specific instructions unique to this office visit that I personally wrote and verbalized to the the pt in detail and then reviewed with pt  by my nurse highlighting any changes in therapy/plan of care  recommended at today's visit.

## 2017-07-11 NOTE — Assessment & Plan Note (Signed)
4-5 min discussion re active cigarette smoking in addition to office E&M  Ask about tobacco use:  About 10 cigs a day, the only one in his house who smokes Advise quitting   I reviewed the Fletcher curve with the patient that basically indicates  if you quit smoking when your best day FEV1 is still well preserved (as is clearly  the case here)  it is highly unlikely you will progress to severe disease and informed the patient there was  no medication on the market that has proven to alter the curve/ its downward trajectory  or the likelihood of progression of their disease(unlike other chronic medical conditions such as atheroclerosis where we do think we can change the natural hx with risk reducing meds)    Therefore stopping smoking and maintaining abstinence are  the most important aspects of care, not choice of inhalers or for that matter, doctors.  Treatment other than smoking cessation  is entirely directed by severity of symptoms and focused also on reducing exacerbations, not attempting to change the natural history of the disease.   Assess willingness not presently but will think about trying patches otc / advised to stop all smoking if uses them Assist in quit attempt  Per pcp when ready Arrange follow up. Follow up per Primary Care planned

## 2017-07-11 NOTE — Patient Instructions (Addendum)
Plan A = Automatic = symbicort 80 Take 2 puffs first thing in am and then another 2 puffs about 12 hours later.   Work on inhaler technique:  relax and gently blow all the way out then take a nice smooth deep breath back in, triggering the inhaler at same time you start breathing in.  Hold for up to 5 seconds if you can. Blow out thru nose. Rinse and gargle with water when done      Plan B = Backup Only use your albuterol (ventolin)as a rescue medication to be used if you can't catch your breath by resting or doing a relaxed purse lip breathing pattern.  - The less you use it, the better it will work when you need it. - Ok to use the inhaler up to 2 puffs  every 4 hours if you must but call for appointment if use goes up over your usual need - Don't leave home without it !!  (think of it like the spare tire for your car)   Plan C = Crisis - only use your albuterol nebulizer if you first try Plan B and it fails to help > ok to use the nebulizer up to every 4 hours but if start needing it regularly call for immediate appointment   The key is to stop smoking completely before smoking completely stops you!   Please remember to go to the  x-ray department downstairs in the basement  for your tests - we will call you with the results when they are available.   Please schedule a follow up visit in 3 months but call sooner if needed with cxr on return

## 2017-07-12 ENCOUNTER — Encounter: Payer: Self-pay | Admitting: Internal Medicine

## 2017-07-12 DIAGNOSIS — N2581 Secondary hyperparathyroidism of renal origin: Secondary | ICD-10-CM | POA: Diagnosis not present

## 2017-07-12 DIAGNOSIS — E8770 Fluid overload, unspecified: Secondary | ICD-10-CM | POA: Diagnosis not present

## 2017-07-12 DIAGNOSIS — N186 End stage renal disease: Secondary | ICD-10-CM | POA: Diagnosis not present

## 2017-07-12 DIAGNOSIS — D631 Anemia in chronic kidney disease: Secondary | ICD-10-CM | POA: Diagnosis not present

## 2017-07-12 NOTE — Assessment & Plan Note (Addendum)
Clinical resolution and radiographic marked improvement > f/u cxr in 3 m with no further abx at this point   Discussed in detail all the  indications, usual  risks and alternatives  relative to the benefits with patient who agrees to proceed with conservative f/u as outlined

## 2017-07-12 NOTE — Progress Notes (Signed)
Spoke with pt's spouse Mechele Claude (ok per DPR) and notified of results/recs

## 2017-07-15 DIAGNOSIS — E8770 Fluid overload, unspecified: Secondary | ICD-10-CM | POA: Diagnosis not present

## 2017-07-15 DIAGNOSIS — N186 End stage renal disease: Secondary | ICD-10-CM | POA: Diagnosis not present

## 2017-07-15 DIAGNOSIS — N2581 Secondary hyperparathyroidism of renal origin: Secondary | ICD-10-CM | POA: Diagnosis not present

## 2017-07-15 DIAGNOSIS — D631 Anemia in chronic kidney disease: Secondary | ICD-10-CM | POA: Diagnosis not present

## 2017-07-17 DIAGNOSIS — D631 Anemia in chronic kidney disease: Secondary | ICD-10-CM | POA: Diagnosis not present

## 2017-07-17 DIAGNOSIS — N186 End stage renal disease: Secondary | ICD-10-CM | POA: Diagnosis not present

## 2017-07-17 DIAGNOSIS — E8770 Fluid overload, unspecified: Secondary | ICD-10-CM | POA: Diagnosis not present

## 2017-07-17 DIAGNOSIS — N2581 Secondary hyperparathyroidism of renal origin: Secondary | ICD-10-CM | POA: Diagnosis not present

## 2017-07-19 DIAGNOSIS — N2581 Secondary hyperparathyroidism of renal origin: Secondary | ICD-10-CM | POA: Diagnosis not present

## 2017-07-19 DIAGNOSIS — E8770 Fluid overload, unspecified: Secondary | ICD-10-CM | POA: Diagnosis not present

## 2017-07-19 DIAGNOSIS — D631 Anemia in chronic kidney disease: Secondary | ICD-10-CM | POA: Diagnosis not present

## 2017-07-19 DIAGNOSIS — N186 End stage renal disease: Secondary | ICD-10-CM | POA: Diagnosis not present

## 2017-07-20 DIAGNOSIS — N2581 Secondary hyperparathyroidism of renal origin: Secondary | ICD-10-CM | POA: Diagnosis not present

## 2017-07-20 DIAGNOSIS — E8770 Fluid overload, unspecified: Secondary | ICD-10-CM | POA: Diagnosis not present

## 2017-07-20 DIAGNOSIS — N186 End stage renal disease: Secondary | ICD-10-CM | POA: Diagnosis not present

## 2017-07-20 DIAGNOSIS — D631 Anemia in chronic kidney disease: Secondary | ICD-10-CM | POA: Diagnosis not present

## 2017-07-22 DIAGNOSIS — Z992 Dependence on renal dialysis: Secondary | ICD-10-CM | POA: Diagnosis not present

## 2017-07-22 DIAGNOSIS — D509 Iron deficiency anemia, unspecified: Secondary | ICD-10-CM | POA: Diagnosis not present

## 2017-07-22 DIAGNOSIS — N2581 Secondary hyperparathyroidism of renal origin: Secondary | ICD-10-CM | POA: Diagnosis not present

## 2017-07-22 DIAGNOSIS — D631 Anemia in chronic kidney disease: Secondary | ICD-10-CM | POA: Diagnosis not present

## 2017-07-22 DIAGNOSIS — T8612 Kidney transplant failure: Secondary | ICD-10-CM | POA: Diagnosis not present

## 2017-07-22 DIAGNOSIS — N186 End stage renal disease: Secondary | ICD-10-CM | POA: Diagnosis not present

## 2017-07-24 DIAGNOSIS — D509 Iron deficiency anemia, unspecified: Secondary | ICD-10-CM | POA: Diagnosis not present

## 2017-07-24 DIAGNOSIS — D631 Anemia in chronic kidney disease: Secondary | ICD-10-CM | POA: Diagnosis not present

## 2017-07-24 DIAGNOSIS — N186 End stage renal disease: Secondary | ICD-10-CM | POA: Diagnosis not present

## 2017-07-24 DIAGNOSIS — N2581 Secondary hyperparathyroidism of renal origin: Secondary | ICD-10-CM | POA: Diagnosis not present

## 2017-07-26 DIAGNOSIS — D509 Iron deficiency anemia, unspecified: Secondary | ICD-10-CM | POA: Diagnosis not present

## 2017-07-26 DIAGNOSIS — D631 Anemia in chronic kidney disease: Secondary | ICD-10-CM | POA: Diagnosis not present

## 2017-07-26 DIAGNOSIS — N186 End stage renal disease: Secondary | ICD-10-CM | POA: Diagnosis not present

## 2017-07-26 DIAGNOSIS — N2581 Secondary hyperparathyroidism of renal origin: Secondary | ICD-10-CM | POA: Diagnosis not present

## 2017-07-29 DIAGNOSIS — N2581 Secondary hyperparathyroidism of renal origin: Secondary | ICD-10-CM | POA: Diagnosis not present

## 2017-07-29 DIAGNOSIS — D509 Iron deficiency anemia, unspecified: Secondary | ICD-10-CM | POA: Diagnosis not present

## 2017-07-29 DIAGNOSIS — D631 Anemia in chronic kidney disease: Secondary | ICD-10-CM | POA: Diagnosis not present

## 2017-07-29 DIAGNOSIS — N186 End stage renal disease: Secondary | ICD-10-CM | POA: Diagnosis not present

## 2017-07-31 DIAGNOSIS — D509 Iron deficiency anemia, unspecified: Secondary | ICD-10-CM | POA: Diagnosis not present

## 2017-07-31 DIAGNOSIS — N2581 Secondary hyperparathyroidism of renal origin: Secondary | ICD-10-CM | POA: Diagnosis not present

## 2017-07-31 DIAGNOSIS — N186 End stage renal disease: Secondary | ICD-10-CM | POA: Diagnosis not present

## 2017-07-31 DIAGNOSIS — D631 Anemia in chronic kidney disease: Secondary | ICD-10-CM | POA: Diagnosis not present

## 2017-08-02 DIAGNOSIS — N186 End stage renal disease: Secondary | ICD-10-CM | POA: Diagnosis not present

## 2017-08-02 DIAGNOSIS — D509 Iron deficiency anemia, unspecified: Secondary | ICD-10-CM | POA: Diagnosis not present

## 2017-08-02 DIAGNOSIS — N2581 Secondary hyperparathyroidism of renal origin: Secondary | ICD-10-CM | POA: Diagnosis not present

## 2017-08-02 DIAGNOSIS — D631 Anemia in chronic kidney disease: Secondary | ICD-10-CM | POA: Diagnosis not present

## 2017-08-05 DIAGNOSIS — D509 Iron deficiency anemia, unspecified: Secondary | ICD-10-CM | POA: Diagnosis not present

## 2017-08-05 DIAGNOSIS — D631 Anemia in chronic kidney disease: Secondary | ICD-10-CM | POA: Diagnosis not present

## 2017-08-05 DIAGNOSIS — N2581 Secondary hyperparathyroidism of renal origin: Secondary | ICD-10-CM | POA: Diagnosis not present

## 2017-08-05 DIAGNOSIS — N186 End stage renal disease: Secondary | ICD-10-CM | POA: Diagnosis not present

## 2017-08-07 DIAGNOSIS — N2581 Secondary hyperparathyroidism of renal origin: Secondary | ICD-10-CM | POA: Diagnosis not present

## 2017-08-07 DIAGNOSIS — D509 Iron deficiency anemia, unspecified: Secondary | ICD-10-CM | POA: Diagnosis not present

## 2017-08-07 DIAGNOSIS — N186 End stage renal disease: Secondary | ICD-10-CM | POA: Diagnosis not present

## 2017-08-07 DIAGNOSIS — D631 Anemia in chronic kidney disease: Secondary | ICD-10-CM | POA: Diagnosis not present

## 2017-08-09 DIAGNOSIS — N186 End stage renal disease: Secondary | ICD-10-CM | POA: Diagnosis not present

## 2017-08-09 DIAGNOSIS — D509 Iron deficiency anemia, unspecified: Secondary | ICD-10-CM | POA: Diagnosis not present

## 2017-08-09 DIAGNOSIS — N2581 Secondary hyperparathyroidism of renal origin: Secondary | ICD-10-CM | POA: Diagnosis not present

## 2017-08-09 DIAGNOSIS — D631 Anemia in chronic kidney disease: Secondary | ICD-10-CM | POA: Diagnosis not present

## 2017-08-12 DIAGNOSIS — N2581 Secondary hyperparathyroidism of renal origin: Secondary | ICD-10-CM | POA: Diagnosis not present

## 2017-08-12 DIAGNOSIS — D631 Anemia in chronic kidney disease: Secondary | ICD-10-CM | POA: Diagnosis not present

## 2017-08-12 DIAGNOSIS — D509 Iron deficiency anemia, unspecified: Secondary | ICD-10-CM | POA: Diagnosis not present

## 2017-08-12 DIAGNOSIS — N186 End stage renal disease: Secondary | ICD-10-CM | POA: Diagnosis not present

## 2017-08-14 DIAGNOSIS — D631 Anemia in chronic kidney disease: Secondary | ICD-10-CM | POA: Diagnosis not present

## 2017-08-14 DIAGNOSIS — D509 Iron deficiency anemia, unspecified: Secondary | ICD-10-CM | POA: Diagnosis not present

## 2017-08-14 DIAGNOSIS — N186 End stage renal disease: Secondary | ICD-10-CM | POA: Diagnosis not present

## 2017-08-14 DIAGNOSIS — N2581 Secondary hyperparathyroidism of renal origin: Secondary | ICD-10-CM | POA: Diagnosis not present

## 2017-08-16 DIAGNOSIS — N186 End stage renal disease: Secondary | ICD-10-CM | POA: Diagnosis not present

## 2017-08-16 DIAGNOSIS — D509 Iron deficiency anemia, unspecified: Secondary | ICD-10-CM | POA: Diagnosis not present

## 2017-08-16 DIAGNOSIS — N2581 Secondary hyperparathyroidism of renal origin: Secondary | ICD-10-CM | POA: Diagnosis not present

## 2017-08-16 DIAGNOSIS — D631 Anemia in chronic kidney disease: Secondary | ICD-10-CM | POA: Diagnosis not present

## 2017-08-19 DIAGNOSIS — D509 Iron deficiency anemia, unspecified: Secondary | ICD-10-CM | POA: Diagnosis not present

## 2017-08-19 DIAGNOSIS — N2581 Secondary hyperparathyroidism of renal origin: Secondary | ICD-10-CM | POA: Diagnosis not present

## 2017-08-19 DIAGNOSIS — N186 End stage renal disease: Secondary | ICD-10-CM | POA: Diagnosis not present

## 2017-08-19 DIAGNOSIS — D631 Anemia in chronic kidney disease: Secondary | ICD-10-CM | POA: Diagnosis not present

## 2017-08-21 DIAGNOSIS — N2581 Secondary hyperparathyroidism of renal origin: Secondary | ICD-10-CM | POA: Diagnosis not present

## 2017-08-21 DIAGNOSIS — D631 Anemia in chronic kidney disease: Secondary | ICD-10-CM | POA: Diagnosis not present

## 2017-08-21 DIAGNOSIS — D509 Iron deficiency anemia, unspecified: Secondary | ICD-10-CM | POA: Diagnosis not present

## 2017-08-21 DIAGNOSIS — N186 End stage renal disease: Secondary | ICD-10-CM | POA: Diagnosis not present

## 2017-08-22 DIAGNOSIS — T8612 Kidney transplant failure: Secondary | ICD-10-CM | POA: Diagnosis not present

## 2017-08-22 DIAGNOSIS — Z992 Dependence on renal dialysis: Secondary | ICD-10-CM | POA: Diagnosis not present

## 2017-08-22 DIAGNOSIS — N186 End stage renal disease: Secondary | ICD-10-CM | POA: Diagnosis not present

## 2017-08-23 DIAGNOSIS — D509 Iron deficiency anemia, unspecified: Secondary | ICD-10-CM | POA: Diagnosis not present

## 2017-08-23 DIAGNOSIS — D631 Anemia in chronic kidney disease: Secondary | ICD-10-CM | POA: Diagnosis not present

## 2017-08-23 DIAGNOSIS — N186 End stage renal disease: Secondary | ICD-10-CM | POA: Diagnosis not present

## 2017-08-23 DIAGNOSIS — N2581 Secondary hyperparathyroidism of renal origin: Secondary | ICD-10-CM | POA: Diagnosis not present

## 2017-08-24 ENCOUNTER — Other Ambulatory Visit: Payer: Self-pay | Admitting: Internal Medicine

## 2017-08-24 DIAGNOSIS — E785 Hyperlipidemia, unspecified: Secondary | ICD-10-CM

## 2017-08-26 DIAGNOSIS — D509 Iron deficiency anemia, unspecified: Secondary | ICD-10-CM | POA: Diagnosis not present

## 2017-08-26 DIAGNOSIS — N2581 Secondary hyperparathyroidism of renal origin: Secondary | ICD-10-CM | POA: Diagnosis not present

## 2017-08-26 DIAGNOSIS — N186 End stage renal disease: Secondary | ICD-10-CM | POA: Diagnosis not present

## 2017-08-26 DIAGNOSIS — D631 Anemia in chronic kidney disease: Secondary | ICD-10-CM | POA: Diagnosis not present

## 2017-08-28 DIAGNOSIS — D631 Anemia in chronic kidney disease: Secondary | ICD-10-CM | POA: Diagnosis not present

## 2017-08-28 DIAGNOSIS — D509 Iron deficiency anemia, unspecified: Secondary | ICD-10-CM | POA: Diagnosis not present

## 2017-08-28 DIAGNOSIS — N2581 Secondary hyperparathyroidism of renal origin: Secondary | ICD-10-CM | POA: Diagnosis not present

## 2017-08-28 DIAGNOSIS — N186 End stage renal disease: Secondary | ICD-10-CM | POA: Diagnosis not present

## 2017-08-30 DIAGNOSIS — N186 End stage renal disease: Secondary | ICD-10-CM | POA: Diagnosis not present

## 2017-08-30 DIAGNOSIS — N2581 Secondary hyperparathyroidism of renal origin: Secondary | ICD-10-CM | POA: Diagnosis not present

## 2017-08-30 DIAGNOSIS — D509 Iron deficiency anemia, unspecified: Secondary | ICD-10-CM | POA: Diagnosis not present

## 2017-08-30 DIAGNOSIS — D631 Anemia in chronic kidney disease: Secondary | ICD-10-CM | POA: Diagnosis not present

## 2017-09-02 DIAGNOSIS — D631 Anemia in chronic kidney disease: Secondary | ICD-10-CM | POA: Diagnosis not present

## 2017-09-02 DIAGNOSIS — D509 Iron deficiency anemia, unspecified: Secondary | ICD-10-CM | POA: Diagnosis not present

## 2017-09-02 DIAGNOSIS — N186 End stage renal disease: Secondary | ICD-10-CM | POA: Diagnosis not present

## 2017-09-02 DIAGNOSIS — N2581 Secondary hyperparathyroidism of renal origin: Secondary | ICD-10-CM | POA: Diagnosis not present

## 2017-09-04 DIAGNOSIS — D631 Anemia in chronic kidney disease: Secondary | ICD-10-CM | POA: Diagnosis not present

## 2017-09-04 DIAGNOSIS — N186 End stage renal disease: Secondary | ICD-10-CM | POA: Diagnosis not present

## 2017-09-04 DIAGNOSIS — D509 Iron deficiency anemia, unspecified: Secondary | ICD-10-CM | POA: Diagnosis not present

## 2017-09-04 DIAGNOSIS — N2581 Secondary hyperparathyroidism of renal origin: Secondary | ICD-10-CM | POA: Diagnosis not present

## 2017-09-06 DIAGNOSIS — N2581 Secondary hyperparathyroidism of renal origin: Secondary | ICD-10-CM | POA: Diagnosis not present

## 2017-09-06 DIAGNOSIS — D509 Iron deficiency anemia, unspecified: Secondary | ICD-10-CM | POA: Diagnosis not present

## 2017-09-06 DIAGNOSIS — N186 End stage renal disease: Secondary | ICD-10-CM | POA: Diagnosis not present

## 2017-09-06 DIAGNOSIS — D631 Anemia in chronic kidney disease: Secondary | ICD-10-CM | POA: Diagnosis not present

## 2017-09-09 DIAGNOSIS — N186 End stage renal disease: Secondary | ICD-10-CM | POA: Diagnosis not present

## 2017-09-09 DIAGNOSIS — N2581 Secondary hyperparathyroidism of renal origin: Secondary | ICD-10-CM | POA: Diagnosis not present

## 2017-09-09 DIAGNOSIS — D631 Anemia in chronic kidney disease: Secondary | ICD-10-CM | POA: Diagnosis not present

## 2017-09-09 DIAGNOSIS — D509 Iron deficiency anemia, unspecified: Secondary | ICD-10-CM | POA: Diagnosis not present

## 2017-09-11 DIAGNOSIS — N186 End stage renal disease: Secondary | ICD-10-CM | POA: Diagnosis not present

## 2017-09-11 DIAGNOSIS — D509 Iron deficiency anemia, unspecified: Secondary | ICD-10-CM | POA: Diagnosis not present

## 2017-09-11 DIAGNOSIS — D631 Anemia in chronic kidney disease: Secondary | ICD-10-CM | POA: Diagnosis not present

## 2017-09-11 DIAGNOSIS — N2581 Secondary hyperparathyroidism of renal origin: Secondary | ICD-10-CM | POA: Diagnosis not present

## 2017-09-14 DIAGNOSIS — D631 Anemia in chronic kidney disease: Secondary | ICD-10-CM | POA: Diagnosis not present

## 2017-09-14 DIAGNOSIS — N2581 Secondary hyperparathyroidism of renal origin: Secondary | ICD-10-CM | POA: Diagnosis not present

## 2017-09-14 DIAGNOSIS — N186 End stage renal disease: Secondary | ICD-10-CM | POA: Diagnosis not present

## 2017-09-14 DIAGNOSIS — D509 Iron deficiency anemia, unspecified: Secondary | ICD-10-CM | POA: Diagnosis not present

## 2017-09-16 DIAGNOSIS — D631 Anemia in chronic kidney disease: Secondary | ICD-10-CM | POA: Diagnosis not present

## 2017-09-16 DIAGNOSIS — N186 End stage renal disease: Secondary | ICD-10-CM | POA: Diagnosis not present

## 2017-09-16 DIAGNOSIS — D509 Iron deficiency anemia, unspecified: Secondary | ICD-10-CM | POA: Diagnosis not present

## 2017-09-16 DIAGNOSIS — N2581 Secondary hyperparathyroidism of renal origin: Secondary | ICD-10-CM | POA: Diagnosis not present

## 2017-09-18 DIAGNOSIS — N2581 Secondary hyperparathyroidism of renal origin: Secondary | ICD-10-CM | POA: Diagnosis not present

## 2017-09-18 DIAGNOSIS — D631 Anemia in chronic kidney disease: Secondary | ICD-10-CM | POA: Diagnosis not present

## 2017-09-18 DIAGNOSIS — D509 Iron deficiency anemia, unspecified: Secondary | ICD-10-CM | POA: Diagnosis not present

## 2017-09-18 DIAGNOSIS — N186 End stage renal disease: Secondary | ICD-10-CM | POA: Diagnosis not present

## 2017-09-19 ENCOUNTER — Other Ambulatory Visit: Payer: Self-pay | Admitting: Internal Medicine

## 2017-09-19 DIAGNOSIS — Z992 Dependence on renal dialysis: Principal | ICD-10-CM

## 2017-09-19 DIAGNOSIS — N186 End stage renal disease: Secondary | ICD-10-CM

## 2017-09-20 DIAGNOSIS — N186 End stage renal disease: Secondary | ICD-10-CM | POA: Diagnosis not present

## 2017-09-20 DIAGNOSIS — D509 Iron deficiency anemia, unspecified: Secondary | ICD-10-CM | POA: Diagnosis not present

## 2017-09-20 DIAGNOSIS — N2581 Secondary hyperparathyroidism of renal origin: Secondary | ICD-10-CM | POA: Diagnosis not present

## 2017-09-20 DIAGNOSIS — D631 Anemia in chronic kidney disease: Secondary | ICD-10-CM | POA: Diagnosis not present

## 2017-09-22 DIAGNOSIS — T8612 Kidney transplant failure: Secondary | ICD-10-CM | POA: Diagnosis not present

## 2017-09-22 DIAGNOSIS — N186 End stage renal disease: Secondary | ICD-10-CM | POA: Diagnosis not present

## 2017-09-22 DIAGNOSIS — Z992 Dependence on renal dialysis: Secondary | ICD-10-CM | POA: Diagnosis not present

## 2017-09-23 DIAGNOSIS — D509 Iron deficiency anemia, unspecified: Secondary | ICD-10-CM | POA: Diagnosis not present

## 2017-09-23 DIAGNOSIS — N186 End stage renal disease: Secondary | ICD-10-CM | POA: Diagnosis not present

## 2017-09-23 DIAGNOSIS — E8770 Fluid overload, unspecified: Secondary | ICD-10-CM | POA: Diagnosis not present

## 2017-09-23 DIAGNOSIS — D631 Anemia in chronic kidney disease: Secondary | ICD-10-CM | POA: Diagnosis not present

## 2017-09-23 DIAGNOSIS — Z23 Encounter for immunization: Secondary | ICD-10-CM | POA: Diagnosis not present

## 2017-09-23 DIAGNOSIS — N2581 Secondary hyperparathyroidism of renal origin: Secondary | ICD-10-CM | POA: Diagnosis not present

## 2017-09-25 DIAGNOSIS — D631 Anemia in chronic kidney disease: Secondary | ICD-10-CM | POA: Diagnosis not present

## 2017-09-25 DIAGNOSIS — N2581 Secondary hyperparathyroidism of renal origin: Secondary | ICD-10-CM | POA: Diagnosis not present

## 2017-09-25 DIAGNOSIS — D509 Iron deficiency anemia, unspecified: Secondary | ICD-10-CM | POA: Diagnosis not present

## 2017-09-25 DIAGNOSIS — E8770 Fluid overload, unspecified: Secondary | ICD-10-CM | POA: Diagnosis not present

## 2017-09-25 DIAGNOSIS — Z23 Encounter for immunization: Secondary | ICD-10-CM | POA: Diagnosis not present

## 2017-09-25 DIAGNOSIS — N186 End stage renal disease: Secondary | ICD-10-CM | POA: Diagnosis not present

## 2017-09-27 DIAGNOSIS — E8770 Fluid overload, unspecified: Secondary | ICD-10-CM | POA: Diagnosis not present

## 2017-09-27 DIAGNOSIS — N186 End stage renal disease: Secondary | ICD-10-CM | POA: Diagnosis not present

## 2017-09-27 DIAGNOSIS — D509 Iron deficiency anemia, unspecified: Secondary | ICD-10-CM | POA: Diagnosis not present

## 2017-09-27 DIAGNOSIS — D631 Anemia in chronic kidney disease: Secondary | ICD-10-CM | POA: Diagnosis not present

## 2017-09-27 DIAGNOSIS — N2581 Secondary hyperparathyroidism of renal origin: Secondary | ICD-10-CM | POA: Diagnosis not present

## 2017-09-27 DIAGNOSIS — Z23 Encounter for immunization: Secondary | ICD-10-CM | POA: Diagnosis not present

## 2017-09-30 ENCOUNTER — Ambulatory Visit (INDEPENDENT_AMBULATORY_CARE_PROVIDER_SITE_OTHER)
Admission: RE | Admit: 2017-09-30 | Discharge: 2017-09-30 | Disposition: A | Discharge status: Home / Self Care | Payer: Medicare Other | Source: Ambulatory Visit | Attending: Internal Medicine | Admitting: Internal Medicine

## 2017-09-30 ENCOUNTER — Ambulatory Visit (INDEPENDENT_AMBULATORY_CARE_PROVIDER_SITE_OTHER): Payer: Medicare Other | Admitting: Internal Medicine

## 2017-09-30 ENCOUNTER — Encounter: Payer: Self-pay | Admitting: Internal Medicine

## 2017-09-30 VITALS — BP 166/82 | HR 90 | Temp 98.4°F | Ht 66.25 in | Wt 222.0 lb

## 2017-09-30 DIAGNOSIS — F17210 Nicotine dependence, cigarettes, uncomplicated: Secondary | ICD-10-CM

## 2017-09-30 DIAGNOSIS — D631 Anemia in chronic kidney disease: Secondary | ICD-10-CM | POA: Diagnosis not present

## 2017-09-30 DIAGNOSIS — J189 Pneumonia, unspecified organism: Secondary | ICD-10-CM

## 2017-09-30 DIAGNOSIS — Z23 Encounter for immunization: Secondary | ICD-10-CM | POA: Diagnosis not present

## 2017-09-30 DIAGNOSIS — N186 End stage renal disease: Secondary | ICD-10-CM | POA: Diagnosis not present

## 2017-09-30 DIAGNOSIS — D509 Iron deficiency anemia, unspecified: Secondary | ICD-10-CM | POA: Diagnosis not present

## 2017-09-30 DIAGNOSIS — E8770 Fluid overload, unspecified: Secondary | ICD-10-CM | POA: Diagnosis not present

## 2017-09-30 DIAGNOSIS — J4530 Mild persistent asthma, uncomplicated: Secondary | ICD-10-CM

## 2017-09-30 DIAGNOSIS — N2581 Secondary hyperparathyroidism of renal origin: Secondary | ICD-10-CM | POA: Diagnosis not present

## 2017-09-30 NOTE — Patient Instructions (Addendum)
Plan A = Automatic = symbicort increase to 160 Take 2 puffs first thing in am and then another 2 puffs about 12 hours later.   Work on Engineer, technical sales technique:  relax and gently blow all the way out then take a nice smooth deep breath back in, triggering the inhaler at same time you start breathing in.  Hold for up to 5 seconds if you can. Blow out symbicort out  thru nose. Rinse and gargle with water when done   Plan B = Backup Only use your albuterol (ventolin)  as a rescue medication to be used if you can't catch your breath by resting or doing a relaxed purse lip breathing pattern.  - The less you use it, the better it will work when you need it. - Ok to use the inhaler up to 2 puffs  every 4 hours if you must but call for appointment if use goes up over your usual need - Don't leave home without it !!  (think of it like the spare tire for your car)   Plan C = Crisis - only use your albuterol nebulizer if you first try Plan B and it fails to help > ok to use the nebulizer up to every 4 hours but if start needing it regularly call for immediate appointment   Please remember to go to the  x-ray department downstairs in the basement  for your tests - we will call you with the results when they are available.     The key is to stop smoking completely before smoking completely stops you!    Please schedule a follow up visit in 3 months but call sooner if needed

## 2017-09-30 NOTE — Progress Notes (Signed)
Subjective:    Patient ID: Kirk [name], male    DOB: 1960-08-15     MRN: [id number]    Brief patient profile:  57 yobm active smoker  referred 10/18/2014 to [name] for eval of ? Copd for clearance for lap chole  with  no airflow obst on spiromtery   10/18/2014 on HD since 2012    History of Present Illness  10/18/2014 1st Kirk [name] office visit/ Kirk [name]   Chief Complaint  Patient presents with  . Advice Only    refer Dr. Nyoka Cowden. Denies any wheezing, no chest tx, no cough. Pt here to be evaluated for COPD.    variable sob with exertion - says can walk fine but no regular walking,  Some sob steps   rec Plan A = Backup=  symbiocrt 80 Take 2 puffs first thing in am and then another 2 puffs about 12 hours later.  Plan B = Back up Only use your combivent  as a rescue medication  Plan C = crisis Only use your nebulizer if you try B first and doesn't work You are cleared for surgery  > tol lap chole fine    Admit date: 04/29/2017 Discharge date: 05/02/2017  Admitted From: Home Disposition:  Home   Recommendations for Outpatient Follow-up:  1. Follow up with PCP in 1-2 weeks 2. Please obtain BMP/CBC in one week   Discharge Condition: Stable CODE STATUS:FULL Diet recommendation: Renal diet   Brief/Interim Summary: 57 year old male with end-stage renal disease on hemodialysis who presents with 48 hours of worsening malaise, associated with productive cough, headache, nausea and poor appetite. On his initial physical examination his temperature is 102.58F, heart rate 98, blood pressure 165/68, respiratory rate 26, oxygen saturation 91-92% on room air. Dry mucous membranes,mild pallor, lungs with bilateral rhonchi, heart S1-S2 present rhythmic, abdomen soft nontender, no lower extremity edema. Sodium 138, potassium 3.1, chloride 94, bicarb 29, glucose 136, BUN 22, creatinine 7.57, white count 5.5,hemoglobin 10.3, hematocrit 31.4, platelets 86.Chest x-ray with a small  patchy infiltrate at the right upper lobe.  Patient wasadmitted to the hospital with a working diagnosis of right upper lobe pneumonia,complicated by sepsis and hypokalemia.The patient was started on intravenous antibiotics. Nephrology was consulted to assist with dialysis.    Discharge Diagnoses:  Sepsis -due to pneumonia -continue ceftriaxone and azithromycin -pt has defervesced--no fever in the last 24 hours -Initially presented with fever of 102.5 F -Blood cultures remain negative -Personally reviewed chest x-ray--mild right upper lobe opacity -most recent temp 98.42F at 1240 on 05/01/17  Healthcare associated pneumonia -Defervesced on ceftriaxone and azithromycin -Check procalcitonin--1.99 -Influenza PCR--neg -Viral respiratory panel--positive coronavirus -home with cefdinir and azithromycin x 4 more days to finish 7 days of tx  Intractable headache -Likely toxic headache from the patient's sepsis -CT brain--neg for acute findings -Continue tramadol as needed severe headache -benadryl and compazine IV x 1 and IV solumedrol-->improved  ESRD -Last received dialysis for 05/01/2017 -Nephrology following -Metabolic bone disease per nephrology  Essential hypertension -Continue metoprolol tartrate and hydralazine -remains high  COPD Exacerbation -mild wheezing on exam-->improved with steroids -start IV solumedrol--home with prednisone x 3 more days -continue bronchodilators -started pulmicort while inpatient -stable on RA      07/11/2017 transition of care post hosp  f/u ov/Kirk [name] re:   Active smoker /AB with restrictive spirometry ? Obesity related  Chief Complaint  Patient presents with  . Hospitalization Follow-up    follow up for PNA in both lungs last month  05/30/17. Per patient, still has a productive cough with clear phlegm. Breathing is getting better.   Dyspnea:  7/11 ok not HT  So not back to basline yet, also limited by fatigue, esp p HD  MWF Cough: since pna coughing noct and early am x up to a tbsp, white mucus assoc with nasal congestion Sleep: up 30 degrees  SABA use:  Both hfa and neb/ confused with when to use  rec Plan A = Automatic = symbicort 80 Take 2 puffs first thing in am and then another 2 puffs about 12 hours later.  Work on inhaler technique:  relax and gently blow all the way out then take a nice smooth deep breath back in, triggering the inhaler at same time you start breathing in.  Hold for up to 5 seconds if you can. Blow out thru nose. Rinse and gargle with water when done Plan B = Backup Only use your albuterol (ventolin)as a rescue medication Plan C = Crisis - only use your albuterol nebulizer if you first try Plan B and it fails to help > ok to use the nebulizer up to every 4 hours but if start needing it regularly call for immediate appointment The key is to stop smoking completely before smoking completely stops you!    09/30/2017  f/u ov/Kirk [name] re: asthma/ smokier/ f/u [facility] pna Chief Complaint  Patient presents with  . Follow-up    Pt c/o increased cough with white sputum, SOB, nasal congestion, runny nose x 2 wks.   Dyspnea:  Walked at [employer] using rollator 2 d prior to OV   Cough: white esp hs  Sleeping: not needing saba no elevate  SABA use: more need over the last 2 weeks 2-3 days/ neb x 2  02: just at hd     No obvious day to day or daytime variability or assoc excess/ purulent sputum or mucus plugs or hemoptysis or cp or chest tightness, subjective wheeze or overt sinus or hb symptoms.   Sleeping as above without nocturnal  or early am exacerbation  of respiratory  c/o's or need for noct saba. Also denies any obvious fluctuation of symptoms with weather or environmental changes or other aggravating or alleviating factors except as outlined above   No unusual exposure hx or h/o childhood pna/ asthma or knowledge of premature birth.  Current Allergies, Complete Past Medical History,  Past Surgical History, Family History, and Social History were reviewed in [employer] [facility] record.  ROS  The following are not active complaints unless bolded Hoarseness, sore throat, dysphagia, dental problems, itching, sneezing,  nasal congestion or discharge of excess mucus or purulent secretions, ear ache,   fever, chills, sweats, unintended wt loss or wt gain, classically pleuritic or exertional cp,  orthopnea pnd or arm/hand swelling  or leg swelling, presyncope, palpitations, abdominal pain, anorexia, nausea, vomiting, diarrhea  or change in bowel habits or change in bladder habits, change in stools or change in urine, dysuria, hematuria,  rash, arthralgias, visual complaints, headache, numbness, weakness or ataxia or problems with walking or coordination,  change in mood or  memory.        Current Meds  Medication Sig  . acetaminophen (TYLENOL) 500 MG tablet Take 1 tablet (500 mg total) by mouth every 6 (six) hours as needed for moderate pain.  Marland Kitchen albuterol (PROVENTIL HFA;VENTOLIN HFA) 108 (90 Base) MCG/ACT inhaler Inhale 2 puffs into the lungs every 6 (six) hours as needed for wheezing or shortness of  breath.  Marland Kitchen aspirin EC 81 MG tablet Take 81 mg by mouth daily.  Marland Kitchen b complex-vitamin c-folic acid (NEPHRO-VITE) 0.8 MG TABS tablet TAKE 1 TABLET BY MOUTH EVERY DAY  . budesonide-formoterol (SYMBICORT) 80-4.5 MCG/ACT inhaler Inhale 2 puffs into the lungs 2 (two) times daily.  . calcitRIOL (ROCALTROL) 0.25 MCG capsule Take 1 capsule (0.25 mcg total) by mouth every Monday, Wednesday, and Friday with hemodialysis.  Marland Kitchen calcium acetate (PHOSLO) 667 MG capsule Take 1,093-2,355 mg by mouth See admin instructions. Take 5,336 mg by mouth three times a day with meals and 2,668 mg with each snack  . Cholecalciferol (VITAMIN D3) 5000 UNITS TABS Take 1 tablet by mouth daily.  . clonazePAM (KLONOPIN) 0.5 MG tablet Take 1 tablet (0.5 mg total) by mouth daily as needed for anxiety.  .  diphenhydrAMINE (BENADRYL) 25 MG tablet Take 25 mg by mouth at bedtime as needed for allergies.  Marland Kitchen guaiFENesin (MUCINEX) 600 MG 12 hr tablet Take 600 mg by mouth 2 (two) times daily.  Marland Kitchen guaiFENesin-dextromethorphan (ROBITUSSIN DM) 100-10 MG/5ML syrup Take 10 mLs by mouth every 4 (four) hours as needed for cough.  . hydrALAZINE (APRESOLINE) 50 MG tablet Take 100 mg by mouth daily.  . metoprolol tartrate (LOPRESSOR) 50 MG tablet Take 75 mg by mouth 2 (two) times daily.  Marland Kitchen omeprazole (PRILOSEC) 20 MG capsule TAKE 1 CAPSULE BY MOUTH 2 TIMES DAILY BEFORE A MEAL.  Marland Kitchen ondansetron (ZOFRAN ODT) 4 MG disintegrating tablet Take 1 tablet (4 mg total) by mouth every 8 (eight) hours as needed for nausea or vomiting.  . pravastatin (PRAVACHOL) 40 MG tablet TAKE 1 TABLET EVERY EVENING  . pravastatin (PRAVACHOL) 40 MG tablet TAKE 1 TABLET EVERY EVENING  . Probiotic Product (PROBIOTIC-10 PO) Take 1 capsule by mouth daily.   . traMADol (ULTRAM) 50 MG tablet Take 1 tablet (50 mg total) by mouth daily as needed (severe headache). (Patient taking differently: Take 50 mg by mouth daily as needed (for severe headaches). )  . UNABLE TO FIND CPAP: During all periods of rest (naps and at bedtime)                  Objective:   Physical Exam  amb bm nad   09/30/2017         222  07/11/2017       217   10/18/14 233 lb 12.8 oz (106.051 kg)  10/05/14 235 lb 3.2 oz (106.686 kg)  09/20/14 230 lb 6.1 oz (104.5 kg)    Vital signs reviewed - Note on arrival 02 sats  98% on RA          HEENT: nl dentition, turbinates bilaterally, and oropharynx. Nl external ear canals without cough reflex   NECK :  without JVD/Nodes/TM/ nl carotid upstrokes bilaterally   LUNGS: no acc muscle use,  Nl contour chest with scattered insp/ exp rhonchi bilaterally without cough on insp or exp maneuvers   CV:  RRR  no s3 or murmur or increase in P2, and no edema   ABD:  Quite obese but soft and nontender with limited inspiratory  excursion in the supine position. No bruits or organomegaly appreciated, bowel sounds nl  MS:  Nl gait/ ext warm without deformities, calf tenderness, cyanosis or clubbing No obvious joint restrictions   SKIN: warm and dry without lesions    NEURO:  alert, approp, nl sensorium with  no motor or cerebellar deficits apparent.       CXR PA and  Lateral:   09/30/2017 :    I personally reviewed images and agree with radiology impression as follows:   Chronic interstitial coarsening, likely bronchitic. No convincing persistence of lower lobe opacity seen 05/30/2017 by CT.            Assessment & Plan:

## 2017-10-01 ENCOUNTER — Telehealth: Payer: Self-pay | Admitting: Internal Medicine

## 2017-10-01 DIAGNOSIS — N2581 Secondary hyperparathyroidism of renal origin: Secondary | ICD-10-CM | POA: Diagnosis not present

## 2017-10-01 DIAGNOSIS — D631 Anemia in chronic kidney disease: Secondary | ICD-10-CM | POA: Diagnosis not present

## 2017-10-01 DIAGNOSIS — D509 Iron deficiency anemia, unspecified: Secondary | ICD-10-CM | POA: Diagnosis not present

## 2017-10-01 DIAGNOSIS — Z23 Encounter for immunization: Secondary | ICD-10-CM | POA: Diagnosis not present

## 2017-10-01 DIAGNOSIS — E8770 Fluid overload, unspecified: Secondary | ICD-10-CM | POA: Diagnosis not present

## 2017-10-01 DIAGNOSIS — N186 End stage renal disease: Secondary | ICD-10-CM | POA: Diagnosis not present

## 2017-10-01 NOTE — Telephone Encounter (Signed)
Called and spoke to [place], Patient's Wife.  CXR results and recommendations given.  Understanding stated.  Nothing further at this time.

## 2017-10-01 NOTE — Progress Notes (Signed)
LMTCB

## 2017-10-02 ENCOUNTER — Encounter: Payer: Self-pay | Admitting: Internal Medicine

## 2017-10-02 DIAGNOSIS — D509 Iron deficiency anemia, unspecified: Secondary | ICD-10-CM | POA: Diagnosis not present

## 2017-10-02 DIAGNOSIS — Z23 Encounter for immunization: Secondary | ICD-10-CM | POA: Diagnosis not present

## 2017-10-02 DIAGNOSIS — N186 End stage renal disease: Secondary | ICD-10-CM | POA: Diagnosis not present

## 2017-10-02 DIAGNOSIS — N2581 Secondary hyperparathyroidism of renal origin: Secondary | ICD-10-CM | POA: Diagnosis not present

## 2017-10-02 DIAGNOSIS — E8770 Fluid overload, unspecified: Secondary | ICD-10-CM | POA: Diagnosis not present

## 2017-10-02 DIAGNOSIS — D631 Anemia in chronic kidney disease: Secondary | ICD-10-CM | POA: Diagnosis not present

## 2017-10-02 MED ORDER — BUDESONIDE-FORMOTEROL FUMARATE 160-4.5 MCG/ACT IN AERO: INHALATION_SPRAY | RESPIRATORY_TRACT | 12 refills | Status: DC

## 2017-10-02 NOTE — Assessment & Plan Note (Signed)
Complete resolution by cxr today, no f/u needed

## 2017-10-02 NOTE — Assessment & Plan Note (Addendum)
-   Spirometry 07/11/2017  Restrictive/ min curvature on no rx - 07/11/2017   Try symb 80 2bid - 09/30/2017  After extensive coaching inhaler device,  effectiveness =    75% > try symbicort 160 2bid as has rhonchi on exam, still smoking   Acting more like ab than copd/  May do better on higher dose symb esp if stops smoking (see separate a/p)    I had an extended discussion with the patient reviewing all relevant studies completed to date and  lasting 15 to 20 minutes of a 25 minute visit    See device teaching which extended face to face time for this visit.  Each maintenance medication was reviewed in detail including emphasizing most importantly the difference between maintenance and prns and under what circumstances the prns are to be triggered using an action plan format that is not reflected in the computer generated alphabetically organized AVS which I have not found useful in most complex patients, especially with respiratory illnesses  Please see AVS for specific instructions unique to this visit that I personally wrote and verbalized to the the pt in detail and then reviewed with pt  by my nurse highlighting any  changes in therapy recommended at today's visit to their plan of care.

## 2017-10-04 DIAGNOSIS — Z23 Encounter for immunization: Secondary | ICD-10-CM | POA: Diagnosis not present

## 2017-10-04 DIAGNOSIS — D509 Iron deficiency anemia, unspecified: Secondary | ICD-10-CM | POA: Diagnosis not present

## 2017-10-04 DIAGNOSIS — D631 Anemia in chronic kidney disease: Secondary | ICD-10-CM | POA: Diagnosis not present

## 2017-10-04 DIAGNOSIS — E8770 Fluid overload, unspecified: Secondary | ICD-10-CM | POA: Diagnosis not present

## 2017-10-04 DIAGNOSIS — N2581 Secondary hyperparathyroidism of renal origin: Secondary | ICD-10-CM | POA: Diagnosis not present

## 2017-10-04 DIAGNOSIS — N186 End stage renal disease: Secondary | ICD-10-CM | POA: Diagnosis not present

## 2017-10-07 DIAGNOSIS — D631 Anemia in chronic kidney disease: Secondary | ICD-10-CM | POA: Diagnosis not present

## 2017-10-07 DIAGNOSIS — E8770 Fluid overload, unspecified: Secondary | ICD-10-CM | POA: Diagnosis not present

## 2017-10-07 DIAGNOSIS — Z23 Encounter for immunization: Secondary | ICD-10-CM | POA: Diagnosis not present

## 2017-10-07 DIAGNOSIS — N2581 Secondary hyperparathyroidism of renal origin: Secondary | ICD-10-CM | POA: Diagnosis not present

## 2017-10-07 DIAGNOSIS — D509 Iron deficiency anemia, unspecified: Secondary | ICD-10-CM | POA: Diagnosis not present

## 2017-10-07 DIAGNOSIS — N186 End stage renal disease: Secondary | ICD-10-CM | POA: Diagnosis not present

## 2017-10-09 DIAGNOSIS — Z23 Encounter for immunization: Secondary | ICD-10-CM | POA: Diagnosis not present

## 2017-10-09 DIAGNOSIS — E8770 Fluid overload, unspecified: Secondary | ICD-10-CM | POA: Diagnosis not present

## 2017-10-09 DIAGNOSIS — N2581 Secondary hyperparathyroidism of renal origin: Secondary | ICD-10-CM | POA: Diagnosis not present

## 2017-10-09 DIAGNOSIS — N186 End stage renal disease: Secondary | ICD-10-CM | POA: Diagnosis not present

## 2017-10-09 DIAGNOSIS — D631 Anemia in chronic kidney disease: Secondary | ICD-10-CM | POA: Diagnosis not present

## 2017-10-09 DIAGNOSIS — D509 Iron deficiency anemia, unspecified: Secondary | ICD-10-CM | POA: Diagnosis not present

## 2017-10-11 DIAGNOSIS — Z23 Encounter for immunization: Secondary | ICD-10-CM | POA: Diagnosis not present

## 2017-10-11 DIAGNOSIS — N2581 Secondary hyperparathyroidism of renal origin: Secondary | ICD-10-CM | POA: Diagnosis not present

## 2017-10-11 DIAGNOSIS — E8770 Fluid overload, unspecified: Secondary | ICD-10-CM | POA: Diagnosis not present

## 2017-10-11 DIAGNOSIS — D509 Iron deficiency anemia, unspecified: Secondary | ICD-10-CM | POA: Diagnosis not present

## 2017-10-11 DIAGNOSIS — N186 End stage renal disease: Secondary | ICD-10-CM | POA: Diagnosis not present

## 2017-10-11 DIAGNOSIS — D631 Anemia in chronic kidney disease: Secondary | ICD-10-CM | POA: Diagnosis not present

## 2017-10-14 DIAGNOSIS — N186 End stage renal disease: Secondary | ICD-10-CM | POA: Diagnosis not present

## 2017-10-14 DIAGNOSIS — D509 Iron deficiency anemia, unspecified: Secondary | ICD-10-CM | POA: Diagnosis not present

## 2017-10-14 DIAGNOSIS — D631 Anemia in chronic kidney disease: Secondary | ICD-10-CM | POA: Diagnosis not present

## 2017-10-14 DIAGNOSIS — N2581 Secondary hyperparathyroidism of renal origin: Secondary | ICD-10-CM | POA: Diagnosis not present

## 2017-10-14 DIAGNOSIS — E8770 Fluid overload, unspecified: Secondary | ICD-10-CM | POA: Diagnosis not present

## 2017-10-14 DIAGNOSIS — Z23 Encounter for immunization: Secondary | ICD-10-CM | POA: Diagnosis not present

## 2017-10-16 DIAGNOSIS — E8770 Fluid overload, unspecified: Secondary | ICD-10-CM | POA: Diagnosis not present

## 2017-10-16 DIAGNOSIS — N2581 Secondary hyperparathyroidism of renal origin: Secondary | ICD-10-CM | POA: Diagnosis not present

## 2017-10-16 DIAGNOSIS — N186 End stage renal disease: Secondary | ICD-10-CM | POA: Diagnosis not present

## 2017-10-16 DIAGNOSIS — D631 Anemia in chronic kidney disease: Secondary | ICD-10-CM | POA: Diagnosis not present

## 2017-10-16 DIAGNOSIS — Z23 Encounter for immunization: Secondary | ICD-10-CM | POA: Diagnosis not present

## 2017-10-16 DIAGNOSIS — D509 Iron deficiency anemia, unspecified: Secondary | ICD-10-CM | POA: Diagnosis not present

## 2017-10-18 DIAGNOSIS — Z23 Encounter for immunization: Secondary | ICD-10-CM | POA: Diagnosis not present

## 2017-10-18 DIAGNOSIS — N2581 Secondary hyperparathyroidism of renal origin: Secondary | ICD-10-CM | POA: Diagnosis not present

## 2017-10-18 DIAGNOSIS — N186 End stage renal disease: Secondary | ICD-10-CM | POA: Diagnosis not present

## 2017-10-18 DIAGNOSIS — D509 Iron deficiency anemia, unspecified: Secondary | ICD-10-CM | POA: Diagnosis not present

## 2017-10-18 DIAGNOSIS — D631 Anemia in chronic kidney disease: Secondary | ICD-10-CM | POA: Diagnosis not present

## 2017-10-18 DIAGNOSIS — E8770 Fluid overload, unspecified: Secondary | ICD-10-CM | POA: Diagnosis not present

## 2017-10-21 DIAGNOSIS — N2581 Secondary hyperparathyroidism of renal origin: Secondary | ICD-10-CM | POA: Diagnosis not present

## 2017-10-21 DIAGNOSIS — N186 End stage renal disease: Secondary | ICD-10-CM | POA: Diagnosis not present

## 2017-10-21 DIAGNOSIS — D509 Iron deficiency anemia, unspecified: Secondary | ICD-10-CM | POA: Diagnosis not present

## 2017-10-21 DIAGNOSIS — Z23 Encounter for immunization: Secondary | ICD-10-CM | POA: Diagnosis not present

## 2017-10-21 DIAGNOSIS — E8770 Fluid overload, unspecified: Secondary | ICD-10-CM | POA: Diagnosis not present

## 2017-10-21 DIAGNOSIS — D631 Anemia in chronic kidney disease: Secondary | ICD-10-CM | POA: Diagnosis not present

## 2017-10-22 DIAGNOSIS — N186 End stage renal disease: Secondary | ICD-10-CM | POA: Diagnosis not present

## 2017-10-22 DIAGNOSIS — T8612 Kidney transplant failure: Secondary | ICD-10-CM | POA: Diagnosis not present

## 2017-10-22 DIAGNOSIS — Z992 Dependence on renal dialysis: Secondary | ICD-10-CM | POA: Diagnosis not present

## 2017-10-23 ENCOUNTER — Other Ambulatory Visit: Payer: Self-pay | Admitting: Nurse Practitioner

## 2017-10-23 DIAGNOSIS — N2581 Secondary hyperparathyroidism of renal origin: Secondary | ICD-10-CM | POA: Diagnosis not present

## 2017-10-23 DIAGNOSIS — G44209 Tension-type headache, unspecified, not intractable: Secondary | ICD-10-CM

## 2017-10-23 DIAGNOSIS — F419 Anxiety disorder, unspecified: Secondary | ICD-10-CM

## 2017-10-23 DIAGNOSIS — N186 End stage renal disease: Secondary | ICD-10-CM | POA: Diagnosis not present

## 2017-10-23 DIAGNOSIS — D631 Anemia in chronic kidney disease: Secondary | ICD-10-CM | POA: Diagnosis not present

## 2017-10-23 NOTE — Telephone Encounter (Signed)
A medication refill was received from pharmacy for clonazepam 0.5 mg and tramadol 50 mg. Rx was called in to pharmacy after verifying last fill date, provider, and quantity on PMP [place].

## 2017-10-25 ENCOUNTER — Other Ambulatory Visit: Payer: Self-pay | Admitting: Internal Medicine

## 2017-10-25 DIAGNOSIS — D631 Anemia in chronic kidney disease: Secondary | ICD-10-CM | POA: Diagnosis not present

## 2017-10-25 DIAGNOSIS — N2581 Secondary hyperparathyroidism of renal origin: Secondary | ICD-10-CM | POA: Diagnosis not present

## 2017-10-25 DIAGNOSIS — E785 Hyperlipidemia, unspecified: Secondary | ICD-10-CM

## 2017-10-25 DIAGNOSIS — N186 End stage renal disease: Secondary | ICD-10-CM | POA: Diagnosis not present

## 2017-10-28 DIAGNOSIS — N2581 Secondary hyperparathyroidism of renal origin: Secondary | ICD-10-CM | POA: Diagnosis not present

## 2017-10-28 DIAGNOSIS — D631 Anemia in chronic kidney disease: Secondary | ICD-10-CM | POA: Diagnosis not present

## 2017-10-28 DIAGNOSIS — N186 End stage renal disease: Secondary | ICD-10-CM | POA: Diagnosis not present

## 2017-10-30 DIAGNOSIS — D631 Anemia in chronic kidney disease: Secondary | ICD-10-CM | POA: Diagnosis not present

## 2017-10-30 DIAGNOSIS — N2581 Secondary hyperparathyroidism of renal origin: Secondary | ICD-10-CM | POA: Diagnosis not present

## 2017-10-30 DIAGNOSIS — N186 End stage renal disease: Secondary | ICD-10-CM | POA: Diagnosis not present

## 2017-10-31 ENCOUNTER — Ambulatory Visit: Payer: Medicare Other | Admitting: Internal Medicine

## 2017-11-01 DIAGNOSIS — N186 End stage renal disease: Secondary | ICD-10-CM | POA: Diagnosis not present

## 2017-11-01 DIAGNOSIS — N2581 Secondary hyperparathyroidism of renal origin: Secondary | ICD-10-CM | POA: Diagnosis not present

## 2017-11-01 DIAGNOSIS — D631 Anemia in chronic kidney disease: Secondary | ICD-10-CM | POA: Diagnosis not present

## 2017-11-04 DIAGNOSIS — N2581 Secondary hyperparathyroidism of renal origin: Secondary | ICD-10-CM | POA: Diagnosis not present

## 2017-11-04 DIAGNOSIS — N186 End stage renal disease: Secondary | ICD-10-CM | POA: Diagnosis not present

## 2017-11-04 DIAGNOSIS — D631 Anemia in chronic kidney disease: Secondary | ICD-10-CM | POA: Diagnosis not present

## 2017-11-06 DIAGNOSIS — N186 End stage renal disease: Secondary | ICD-10-CM | POA: Diagnosis not present

## 2017-11-06 DIAGNOSIS — N2581 Secondary hyperparathyroidism of renal origin: Secondary | ICD-10-CM | POA: Diagnosis not present

## 2017-11-06 DIAGNOSIS — D631 Anemia in chronic kidney disease: Secondary | ICD-10-CM | POA: Diagnosis not present

## 2017-11-08 DIAGNOSIS — N186 End stage renal disease: Secondary | ICD-10-CM | POA: Diagnosis not present

## 2017-11-08 DIAGNOSIS — D631 Anemia in chronic kidney disease: Secondary | ICD-10-CM | POA: Diagnosis not present

## 2017-11-08 DIAGNOSIS — N2581 Secondary hyperparathyroidism of renal origin: Secondary | ICD-10-CM | POA: Diagnosis not present

## 2017-11-11 DIAGNOSIS — N2581 Secondary hyperparathyroidism of renal origin: Secondary | ICD-10-CM | POA: Diagnosis not present

## 2017-11-11 DIAGNOSIS — N186 End stage renal disease: Secondary | ICD-10-CM | POA: Diagnosis not present

## 2017-11-11 DIAGNOSIS — D631 Anemia in chronic kidney disease: Secondary | ICD-10-CM | POA: Diagnosis not present

## 2017-11-13 DIAGNOSIS — N2581 Secondary hyperparathyroidism of renal origin: Secondary | ICD-10-CM | POA: Diagnosis not present

## 2017-11-13 DIAGNOSIS — D631 Anemia in chronic kidney disease: Secondary | ICD-10-CM | POA: Diagnosis not present

## 2017-11-13 DIAGNOSIS — N186 End stage renal disease: Secondary | ICD-10-CM | POA: Diagnosis not present

## 2017-11-15 DIAGNOSIS — N2581 Secondary hyperparathyroidism of renal origin: Secondary | ICD-10-CM | POA: Diagnosis not present

## 2017-11-15 DIAGNOSIS — N186 End stage renal disease: Secondary | ICD-10-CM | POA: Diagnosis not present

## 2017-11-15 DIAGNOSIS — D631 Anemia in chronic kidney disease: Secondary | ICD-10-CM | POA: Diagnosis not present

## 2017-11-18 DIAGNOSIS — N186 End stage renal disease: Secondary | ICD-10-CM | POA: Diagnosis not present

## 2017-11-18 DIAGNOSIS — N2581 Secondary hyperparathyroidism of renal origin: Secondary | ICD-10-CM | POA: Diagnosis not present

## 2017-11-18 DIAGNOSIS — D631 Anemia in chronic kidney disease: Secondary | ICD-10-CM | POA: Diagnosis not present

## 2017-11-20 DIAGNOSIS — D631 Anemia in chronic kidney disease: Secondary | ICD-10-CM | POA: Diagnosis not present

## 2017-11-20 DIAGNOSIS — N2581 Secondary hyperparathyroidism of renal origin: Secondary | ICD-10-CM | POA: Diagnosis not present

## 2017-11-20 DIAGNOSIS — N186 End stage renal disease: Secondary | ICD-10-CM | POA: Diagnosis not present

## 2017-11-22 DIAGNOSIS — N2581 Secondary hyperparathyroidism of renal origin: Secondary | ICD-10-CM | POA: Diagnosis not present

## 2017-11-22 DIAGNOSIS — E8779 Other fluid overload: Secondary | ICD-10-CM | POA: Diagnosis not present

## 2017-11-22 DIAGNOSIS — N186 End stage renal disease: Secondary | ICD-10-CM | POA: Diagnosis not present

## 2017-11-22 DIAGNOSIS — D631 Anemia in chronic kidney disease: Secondary | ICD-10-CM | POA: Diagnosis not present

## 2017-11-22 DIAGNOSIS — T8612 Kidney transplant failure: Secondary | ICD-10-CM | POA: Diagnosis not present

## 2017-11-22 DIAGNOSIS — Z992 Dependence on renal dialysis: Secondary | ICD-10-CM | POA: Diagnosis not present

## 2017-11-22 DIAGNOSIS — Z23 Encounter for immunization: Secondary | ICD-10-CM | POA: Diagnosis not present

## 2017-11-25 DIAGNOSIS — N2581 Secondary hyperparathyroidism of renal origin: Secondary | ICD-10-CM | POA: Diagnosis not present

## 2017-11-25 DIAGNOSIS — E8779 Other fluid overload: Secondary | ICD-10-CM | POA: Diagnosis not present

## 2017-11-25 DIAGNOSIS — D631 Anemia in chronic kidney disease: Secondary | ICD-10-CM | POA: Diagnosis not present

## 2017-11-25 DIAGNOSIS — N186 End stage renal disease: Secondary | ICD-10-CM | POA: Diagnosis not present

## 2017-11-25 DIAGNOSIS — Z23 Encounter for immunization: Secondary | ICD-10-CM | POA: Diagnosis not present

## 2017-11-27 DIAGNOSIS — Z23 Encounter for immunization: Secondary | ICD-10-CM | POA: Diagnosis not present

## 2017-11-27 DIAGNOSIS — D631 Anemia in chronic kidney disease: Secondary | ICD-10-CM | POA: Diagnosis not present

## 2017-11-27 DIAGNOSIS — N2581 Secondary hyperparathyroidism of renal origin: Secondary | ICD-10-CM | POA: Diagnosis not present

## 2017-11-27 DIAGNOSIS — E8779 Other fluid overload: Secondary | ICD-10-CM | POA: Diagnosis not present

## 2017-11-27 DIAGNOSIS — N186 End stage renal disease: Secondary | ICD-10-CM | POA: Diagnosis not present

## 2017-11-29 DIAGNOSIS — N2581 Secondary hyperparathyroidism of renal origin: Secondary | ICD-10-CM | POA: Diagnosis not present

## 2017-11-29 DIAGNOSIS — D631 Anemia in chronic kidney disease: Secondary | ICD-10-CM | POA: Diagnosis not present

## 2017-11-29 DIAGNOSIS — E8779 Other fluid overload: Secondary | ICD-10-CM | POA: Diagnosis not present

## 2017-11-29 DIAGNOSIS — Z23 Encounter for immunization: Secondary | ICD-10-CM | POA: Diagnosis not present

## 2017-11-29 DIAGNOSIS — N186 End stage renal disease: Secondary | ICD-10-CM | POA: Diagnosis not present

## 2017-12-02 DIAGNOSIS — N186 End stage renal disease: Secondary | ICD-10-CM | POA: Diagnosis not present

## 2017-12-02 DIAGNOSIS — E8779 Other fluid overload: Secondary | ICD-10-CM | POA: Diagnosis not present

## 2017-12-02 DIAGNOSIS — Z23 Encounter for immunization: Secondary | ICD-10-CM | POA: Diagnosis not present

## 2017-12-02 DIAGNOSIS — N2581 Secondary hyperparathyroidism of renal origin: Secondary | ICD-10-CM | POA: Diagnosis not present

## 2017-12-02 DIAGNOSIS — D631 Anemia in chronic kidney disease: Secondary | ICD-10-CM | POA: Diagnosis not present

## 2017-12-04 DIAGNOSIS — D631 Anemia in chronic kidney disease: Secondary | ICD-10-CM | POA: Diagnosis not present

## 2017-12-04 DIAGNOSIS — N186 End stage renal disease: Secondary | ICD-10-CM | POA: Diagnosis not present

## 2017-12-04 DIAGNOSIS — N2581 Secondary hyperparathyroidism of renal origin: Secondary | ICD-10-CM | POA: Diagnosis not present

## 2017-12-04 DIAGNOSIS — Z23 Encounter for immunization: Secondary | ICD-10-CM | POA: Diagnosis not present

## 2017-12-04 DIAGNOSIS — E8779 Other fluid overload: Secondary | ICD-10-CM | POA: Diagnosis not present

## 2017-12-06 DIAGNOSIS — E8779 Other fluid overload: Secondary | ICD-10-CM | POA: Diagnosis not present

## 2017-12-06 DIAGNOSIS — N2581 Secondary hyperparathyroidism of renal origin: Secondary | ICD-10-CM | POA: Diagnosis not present

## 2017-12-06 DIAGNOSIS — Z23 Encounter for immunization: Secondary | ICD-10-CM | POA: Diagnosis not present

## 2017-12-06 DIAGNOSIS — N186 End stage renal disease: Secondary | ICD-10-CM | POA: Diagnosis not present

## 2017-12-06 DIAGNOSIS — D631 Anemia in chronic kidney disease: Secondary | ICD-10-CM | POA: Diagnosis not present

## 2017-12-09 DIAGNOSIS — N2581 Secondary hyperparathyroidism of renal origin: Secondary | ICD-10-CM | POA: Diagnosis not present

## 2017-12-09 DIAGNOSIS — Z23 Encounter for immunization: Secondary | ICD-10-CM | POA: Diagnosis not present

## 2017-12-09 DIAGNOSIS — E8779 Other fluid overload: Secondary | ICD-10-CM | POA: Diagnosis not present

## 2017-12-09 DIAGNOSIS — N186 End stage renal disease: Secondary | ICD-10-CM | POA: Diagnosis not present

## 2017-12-09 DIAGNOSIS — D631 Anemia in chronic kidney disease: Secondary | ICD-10-CM | POA: Diagnosis not present

## 2017-12-11 DIAGNOSIS — D631 Anemia in chronic kidney disease: Secondary | ICD-10-CM | POA: Diagnosis not present

## 2017-12-11 DIAGNOSIS — N2581 Secondary hyperparathyroidism of renal origin: Secondary | ICD-10-CM | POA: Diagnosis not present

## 2017-12-11 DIAGNOSIS — Z23 Encounter for immunization: Secondary | ICD-10-CM | POA: Diagnosis not present

## 2017-12-11 DIAGNOSIS — E8779 Other fluid overload: Secondary | ICD-10-CM | POA: Diagnosis not present

## 2017-12-11 DIAGNOSIS — N186 End stage renal disease: Secondary | ICD-10-CM | POA: Diagnosis not present

## 2017-12-12 DIAGNOSIS — I871 Compression of vein: Secondary | ICD-10-CM | POA: Diagnosis not present

## 2017-12-12 DIAGNOSIS — N186 End stage renal disease: Secondary | ICD-10-CM | POA: Diagnosis not present

## 2017-12-12 DIAGNOSIS — Z992 Dependence on renal dialysis: Secondary | ICD-10-CM | POA: Diagnosis not present

## 2017-12-13 DIAGNOSIS — N2581 Secondary hyperparathyroidism of renal origin: Secondary | ICD-10-CM | POA: Diagnosis not present

## 2017-12-13 DIAGNOSIS — E8779 Other fluid overload: Secondary | ICD-10-CM | POA: Diagnosis not present

## 2017-12-13 DIAGNOSIS — Z23 Encounter for immunization: Secondary | ICD-10-CM | POA: Diagnosis not present

## 2017-12-13 DIAGNOSIS — N186 End stage renal disease: Secondary | ICD-10-CM | POA: Diagnosis not present

## 2017-12-13 DIAGNOSIS — D631 Anemia in chronic kidney disease: Secondary | ICD-10-CM | POA: Diagnosis not present

## 2017-12-15 DIAGNOSIS — N2581 Secondary hyperparathyroidism of renal origin: Secondary | ICD-10-CM | POA: Diagnosis not present

## 2017-12-15 DIAGNOSIS — Z23 Encounter for immunization: Secondary | ICD-10-CM | POA: Diagnosis not present

## 2017-12-15 DIAGNOSIS — E8779 Other fluid overload: Secondary | ICD-10-CM | POA: Diagnosis not present

## 2017-12-15 DIAGNOSIS — D631 Anemia in chronic kidney disease: Secondary | ICD-10-CM | POA: Diagnosis not present

## 2017-12-15 DIAGNOSIS — N186 End stage renal disease: Secondary | ICD-10-CM | POA: Diagnosis not present

## 2017-12-16 DIAGNOSIS — N2581 Secondary hyperparathyroidism of renal origin: Secondary | ICD-10-CM | POA: Diagnosis not present

## 2017-12-16 DIAGNOSIS — E8779 Other fluid overload: Secondary | ICD-10-CM | POA: Diagnosis not present

## 2017-12-16 DIAGNOSIS — Z23 Encounter for immunization: Secondary | ICD-10-CM | POA: Diagnosis not present

## 2017-12-16 DIAGNOSIS — D631 Anemia in chronic kidney disease: Secondary | ICD-10-CM | POA: Diagnosis not present

## 2017-12-16 DIAGNOSIS — N186 End stage renal disease: Secondary | ICD-10-CM | POA: Diagnosis not present

## 2017-12-17 DIAGNOSIS — E8779 Other fluid overload: Secondary | ICD-10-CM | POA: Diagnosis not present

## 2017-12-17 DIAGNOSIS — Z23 Encounter for immunization: Secondary | ICD-10-CM | POA: Diagnosis not present

## 2017-12-17 DIAGNOSIS — N186 End stage renal disease: Secondary | ICD-10-CM | POA: Diagnosis not present

## 2017-12-17 DIAGNOSIS — N2581 Secondary hyperparathyroidism of renal origin: Secondary | ICD-10-CM | POA: Diagnosis not present

## 2017-12-17 DIAGNOSIS — D631 Anemia in chronic kidney disease: Secondary | ICD-10-CM | POA: Diagnosis not present

## 2017-12-20 DIAGNOSIS — Z23 Encounter for immunization: Secondary | ICD-10-CM | POA: Diagnosis not present

## 2017-12-20 DIAGNOSIS — N186 End stage renal disease: Secondary | ICD-10-CM | POA: Diagnosis not present

## 2017-12-20 DIAGNOSIS — E8779 Other fluid overload: Secondary | ICD-10-CM | POA: Diagnosis not present

## 2017-12-20 DIAGNOSIS — D631 Anemia in chronic kidney disease: Secondary | ICD-10-CM | POA: Diagnosis not present

## 2017-12-20 DIAGNOSIS — N2581 Secondary hyperparathyroidism of renal origin: Secondary | ICD-10-CM | POA: Diagnosis not present

## 2017-12-22 DIAGNOSIS — T8612 Kidney transplant failure: Secondary | ICD-10-CM | POA: Diagnosis not present

## 2017-12-22 DIAGNOSIS — Z992 Dependence on renal dialysis: Secondary | ICD-10-CM | POA: Diagnosis not present

## 2017-12-22 DIAGNOSIS — N186 End stage renal disease: Secondary | ICD-10-CM | POA: Diagnosis not present

## 2017-12-23 DIAGNOSIS — N186 End stage renal disease: Secondary | ICD-10-CM | POA: Diagnosis not present

## 2017-12-23 DIAGNOSIS — N2581 Secondary hyperparathyroidism of renal origin: Secondary | ICD-10-CM | POA: Diagnosis not present

## 2017-12-23 DIAGNOSIS — D631 Anemia in chronic kidney disease: Secondary | ICD-10-CM | POA: Diagnosis not present

## 2017-12-23 DIAGNOSIS — D509 Iron deficiency anemia, unspecified: Secondary | ICD-10-CM | POA: Diagnosis not present

## 2017-12-25 DIAGNOSIS — N2581 Secondary hyperparathyroidism of renal origin: Secondary | ICD-10-CM | POA: Diagnosis not present

## 2017-12-25 DIAGNOSIS — N186 End stage renal disease: Secondary | ICD-10-CM | POA: Diagnosis not present

## 2017-12-25 DIAGNOSIS — D631 Anemia in chronic kidney disease: Secondary | ICD-10-CM | POA: Diagnosis not present

## 2017-12-25 DIAGNOSIS — D509 Iron deficiency anemia, unspecified: Secondary | ICD-10-CM | POA: Diagnosis not present

## 2017-12-27 DIAGNOSIS — N2581 Secondary hyperparathyroidism of renal origin: Secondary | ICD-10-CM | POA: Diagnosis not present

## 2017-12-27 DIAGNOSIS — D509 Iron deficiency anemia, unspecified: Secondary | ICD-10-CM | POA: Diagnosis not present

## 2017-12-27 DIAGNOSIS — D631 Anemia in chronic kidney disease: Secondary | ICD-10-CM | POA: Diagnosis not present

## 2017-12-27 DIAGNOSIS — N186 End stage renal disease: Secondary | ICD-10-CM | POA: Diagnosis not present

## 2017-12-30 ENCOUNTER — Ambulatory Visit (INDEPENDENT_AMBULATORY_CARE_PROVIDER_SITE_OTHER): Payer: Medicare Other | Admitting: Internal Medicine

## 2017-12-30 ENCOUNTER — Encounter: Payer: Self-pay | Admitting: Internal Medicine

## 2017-12-30 DIAGNOSIS — D631 Anemia in chronic kidney disease: Secondary | ICD-10-CM | POA: Diagnosis not present

## 2017-12-30 DIAGNOSIS — J4530 Mild persistent asthma, uncomplicated: Secondary | ICD-10-CM | POA: Diagnosis not present

## 2017-12-30 DIAGNOSIS — F17210 Nicotine dependence, cigarettes, uncomplicated: Secondary | ICD-10-CM | POA: Diagnosis not present

## 2017-12-30 DIAGNOSIS — G44209 Tension-type headache, unspecified, not intractable: Secondary | ICD-10-CM

## 2017-12-30 DIAGNOSIS — N2581 Secondary hyperparathyroidism of renal origin: Secondary | ICD-10-CM | POA: Diagnosis not present

## 2017-12-30 DIAGNOSIS — D509 Iron deficiency anemia, unspecified: Secondary | ICD-10-CM | POA: Diagnosis not present

## 2017-12-30 DIAGNOSIS — N186 End stage renal disease: Secondary | ICD-10-CM | POA: Diagnosis not present

## 2017-12-30 MED ORDER — TRAMADOL HCL 50 MG PO TABS: ORAL_TABLET | ORAL | 0 refills | Status: DC

## 2017-12-30 MED ORDER — PREDNISONE 10 MG PO TABS: ORAL_TABLET | ORAL | 0 refills | Status: DC

## 2017-12-30 NOTE — Patient Instructions (Addendum)
Stop robitussin and use maximum dose of mucinex dm up to 1200 mg every 12hours as needed and  Take tramadol 50 mg every 4 hours if needed   Prednisone 10 mg take  4 each am x 2 days,   2 each am x 2 days,  1 each am x 2 days and stop   Prilosec should be Take 30- 60 min before your first and last meals of the day   Of not improving need a cxr at the old building and office visit here asap    Please schedule a follow up visit in 3 months but call sooner if needed

## 2017-12-30 NOTE — Progress Notes (Signed)
Subjective:    Patient ID: Kirk [name], male    DOB: [birth year]     MRN: [id number]    Brief patient profile:  57 yobm active smoker  referred 10/18/2014 to [name] for eval of ? Copd for clearance for lap chole  with  no airflow obst on spiromtery   10/18/2014 on HD since 2012    History of Present Illness  10/18/2014 1st [facility] office visit/ Kirk [name]   Chief Complaint  Patient presents with  . Advice Only    refer Dr. Nyoka Cowden. Denies any wheezing, no chest tx, no cough. Pt here to be evaluated for COPD.    variable sob with exertion - says can walk fine but no regular walking,  Some sob steps   rec Plan A = Backup=  symbiocrt 80 Take 2 puffs first thing in am and then another 2 puffs about 12 hours later.  Plan B = Back up Only use your combivent  as a rescue medication  Plan C = crisis Only use your nebulizer if you try B first and doesn't work You are cleared for surgery  > tol lap chole fine    Admit date: 04/29/2017 Discharge date: 05/02/2017  Admitted From: Home Disposition:  Home   Recommendations for Outpatient Follow-up:  1. Follow up with PCP in 1-2 weeks 2. Please obtain BMP/CBC in one week   Discharge Condition: Stable CODE STATUS:FULL Diet recommendation: Renal diet   Brief/Interim Summary: 57 year old male with end-stage renal disease on hemodialysis who presents with 48 hours of worsening malaise, associated with productive cough, headache, nausea and poor appetite. On his initial physical examination his temperature is 102.54F, heart rate 98, blood pressure 165/68, respiratory rate 26, oxygen saturation 91-92% on room air. Dry mucous membranes,mild pallor, lungs with bilateral rhonchi, heart S1-S2 present rhythmic, abdomen soft nontender, no lower extremity edema. Sodium 138, potassium 3.1, chloride 94, bicarb 29, glucose 136, BUN 22, creatinine 7.57, white count 5.5,hemoglobin 10.3, hematocrit 31.4, platelets 86.Chest x-ray with a small  patchy infiltrate at the right upper lobe.  Patient wasadmitted to the hospital with a working diagnosis of right upper lobe pneumonia,complicated by sepsis and hypokalemia.The patient was started on intravenous antibiotics. Nephrology was consulted to assist with dialysis.    Discharge Diagnoses:  Sepsis -due to pneumonia -continue ceftriaxone and azithromycin -pt has defervesced--no fever in the last 24 hours -Initially presented with fever of 102.5 F -Blood cultures remain negative -Personally reviewed chest x-ray--mild right upper lobe opacity -most recent temp 98.36F at 1240 on 05/01/17  Healthcare associated pneumonia -Defervesced on ceftriaxone and azithromycin -Check procalcitonin--1.99 -Influenza PCR--neg -Viral respiratory panel--positive coronavirus -home with cefdinir and azithromycin x 4 more days to finish 7 days of tx  Intractable headache -Likely toxic headache from the patient's sepsis -CT brain--neg for acute findings -Continue tramadol as needed severe headache -benadryl and compazine IV x 1 and IV solumedrol-->improved  ESRD -Last received dialysis for 05/01/2017 -Nephrology following -Metabolic bone disease per nephrology  Essential hypertension -Continue metoprolol tartrate and hydralazine -remains high  COPD Exacerbation -mild wheezing on exam-->improved with steroids -start IV solumedrol--home with prednisone x 3 more days -continue bronchodilators -started pulmicort while inpatient -stable on RA      07/11/2017 transition of care post hosp  f/u ov/Kirk [name] re:   Active smoker /AB with restrictive spirometry ? Obesity related  Chief Complaint  Patient presents with  . Hospitalization Follow-up    follow up for PNA in both lungs last month  05/30/17. Per patient, still has a productive cough with clear phlegm. Breathing is getting better.   Dyspnea:  7/11 ok not HT  So not back to basline yet, also limited by fatigue, esp p HD  MWF Cough: since pna coughing noct and early am x up to a tbsp, white mucus assoc with nasal congestion Sleep: up 30 degrees  SABA use:  Both hfa and neb/ confused with when to use  rec Plan A = Automatic = symbicort 80 Take 2 puffs first thing in am and then another 2 puffs about 12 hours later.  Work on inhaler technique:  relax and gently blow all the way out then take a nice smooth deep breath back in, triggering the inhaler at same time you start breathing in.  Hold for up to 5 seconds if you can. Blow out thru nose. Rinse and gargle with water when done Plan B = Backup Only use your albuterol (ventolin)as a rescue medication Plan C = Crisis - only use your albuterol nebulizer if you first try Plan B and it fails to help > ok to use the nebulizer up to every 4 hours but if start needing it regularly call for immediate appointment The key is to stop smoking completely before smoking completely stops you!    09/30/2017  f/u ov/Kirk [name] re: asthma/ smoker/ f/u [facility] pna Chief Complaint  Patient presents with  . Follow-up    Pt c/o increased cough with white sputum, SOB, nasal congestion, runny nose x 2 wks.   Dyspnea:  Walked at [employer] using rollator 2 d prior to OV   Cough: white esp hs  Sleeping: not needing saba no elevate  SABA use: more need over the last 2 weeks 2-3 days/ neb x 2  02: just at hd   rec Plan A = Automatic = symbicort increase to 160 Take 2 puffs first thing in am and then another 2 puffs about 12 hours later.  Work on [employer]raiser B = Backup Only use your albuterol (ventolin)  as a rescue medication Plan C = Crisis - only use your albuterol nebulizer if you first try Plan B and it fails to help > ok to use the nebulizer up to every 4 hours but if start needing it regularly call for immediate appointment The key is to stop smoking completely before smoking completely stops you!     12/30/2017  f/u ov/Kirk [name] re: smoker with AB/ still smoking/  maint on symb 160 2bi  Chief Complaint  Patient presents with  . Follow-up    Increased cough and wheezing for the past 2-3 wks. He is coughing up some clear sputum. He is using his albuterol inhaler about 4 x per day and neb with albuterol about 2 x per wk.     Dyspnea:  MMRC3 = can't walk 100 yards even at a slow pace at a flat grade s stopping due to sob   Cough: worse than usual, feels like a chest cold/ mucoid production only  Sleeping: sitting upright like on a couch SABA use: baseline neb once a month, up to qid x sev weeks with onset of "chest  cold" , saba does help 02: no      No obvious day to day or daytime variability or assoc excess/ purulent sputum or mucus plugs or hemoptysis or cp or chest tightness, subjective wheeze or overt sinus or hb symptoms.   Sleeping as above  without nocturnal  or early  am exacerbation  of respiratory  c/o's or need for noct saba. Also denies any obvious fluctuation of symptoms with weather or environmental changes or other aggravating or alleviating factors except as outlined above   No unusual exposure hx or h/o childhood pna/ asthma or knowledge of premature birth.  Current Allergies, Complete Past Medical History, Past Surgical History, Family History, and Social History were reviewed in [employer] record.  ROS  The following are not active complaints unless bolded Hoarseness, sore throat, dysphagia, dental problems, itching, sneezing,  nasal congestion or discharge of excess mucus or purulent secretions, ear ache,   fever, chills, sweats, unintended wt loss or wt gain, classically pleuritic or exertional cp,  orthopnea pnd or arm/hand swelling  or leg swelling, presyncope, palpitations, abdominal pain, anorexia, nausea, vomiting, diarrhea  or change in bowel habits or change in bladder habits, change in stools or change in urine, dysuria, hematuria,  rash, arthralgias, visual complaints, headache, numbness, weakness or  ataxia or problems with walking or coordination,  change in mood or  memory.        Current Meds  Medication Sig  . acetaminophen (TYLENOL) 500 MG tablet Take 1 tablet (500 mg total) by mouth every 6 (six) hours as needed for moderate pain.  Marland Kitchen albuterol (PROVENTIL HFA;VENTOLIN HFA) 108 (90 Base) MCG/ACT inhaler Inhale 2 puffs into the lungs every 6 (six) hours as needed for wheezing or shortness of breath.  Marland Kitchen aspirin EC 81 MG tablet Take 81 mg by mouth daily.  Marland Kitchen b complex-vitamin c-folic acid (NEPHRO-VITE) 0.8 MG TABS tablet TAKE 1 TABLET BY MOUTH EVERY DAY  . budesonide-formoterol (SYMBICORT) 160-4.5 MCG/ACT inhaler Take 2 puffs first thing in am and then another 2 puffs about 12 hours later.  . calcitRIOL (ROCALTROL) 0.25 MCG capsule Take 1 capsule (0.25 mcg total) by mouth every Monday, Wednesday, and Friday with hemodialysis.  Marland Kitchen calcium acetate (PHOSLO) 667 MG capsule Take 3,244-0,102 mg by mouth See admin instructions. Take 5,336 mg by mouth three times a day with meals and 2,668 mg with each snack  . Cholecalciferol (VITAMIN D3) 5000 UNITS TABS Take 1 tablet by mouth daily.  . clonazePAM (KLONOPIN) 0.5 MG tablet TAKE 1 TABLET (0.5 MG TOTAL) BY MOUTH DAILY AS NEEDED FOR ANXIETY.  . diphenhydrAMINE (BENADRYL) 25 MG tablet Take 25 mg by mouth at bedtime as needed for allergies.  Marland Kitchen guaiFENesin (MUCINEX) 600 MG 12 hr tablet Take 600 mg by mouth 2 (two) times daily.  Marland Kitchen guaiFENesin-dextromethorphan (ROBITUSSIN DM) 100-10 MG/5ML syrup Take 10 mLs by mouth every 4 (four) hours as needed for cough.  . hydrALAZINE (APRESOLINE) 50 MG tablet Take 100 mg by mouth daily.  . metoprolol tartrate (LOPRESSOR) 50 MG tablet Take 75 mg by mouth 2 (two) times daily.  . metoprolol tartrate (LOPRESSOR) 50 MG tablet Take 1.5 tablets (75 mg total) by mouth 2 (two) times daily. Take One and a half tablet by mouth twice daily  . omeprazole (PRILOSEC) 20 MG capsule TAKE 1 CAPSULE BY MOUTH 2 TIMES DAILY BEFORE A MEAL.    Marland Kitchen ondansetron (ZOFRAN ODT) 4 MG disintegrating tablet Take 1 tablet (4 mg total) by mouth every 8 (eight) hours as needed for nausea or vomiting.  . pravastatin (PRAVACHOL) 40 MG tablet TAKE 1 TABLET EVERY EVENING  . Probiotic Product (PROBIOTIC-10 PO) Take 1 capsule by mouth daily.   . traMADol (ULTRAM) 50 MG tablet TAKE 1 TABLET BY MOUTH DAILY AS NEEDED (SEVERE HEADACHE).  Marland Kitchen UNABLE  TO FIND CPAP: During all periods of rest (naps and at bedtime)             Objective:   Physical Exam  amb bm nad    12/30/2017      222  09/30/2017         222  07/11/2017       217   10/18/14 233 lb 12.8 oz (106.051 kg)  10/05/14 235 lb 3.2 oz (106.686 kg)  09/20/14 230 lb 6.1 oz (104.5 kg)    Vital signs reviewed - Note on arrival 02 sats  98% on RA     HEENT: nl dentition, turbinates bilaterally, and oropharynx. Nl external ear canals without cough reflex   NECK :  without JVD/Nodes/TM/ nl carotid upstrokes bilaterally   LUNGS: no acc muscle use,  Nl contour chest with insp/ exp rhonchi  bilaterally without cough on insp or exp maneuvers   CV:  RRR  no s3 or murmur or increase in P2, and no edema   ABD:  Quite obese/ soft and nontender with nl inspiratory excursion in the supine position. No bruits or organomegaly appreciated, bowel sounds nl  MS:  Nl gait/ ext warm without deformities, calf tenderness, cyanosis or clubbing No obvious joint restrictions   SKIN: warm and dry without lesions    NEURO:  alert, approp, nl sensorium with  no motor or cerebellar deficits apparent.            Assessment & Plan:

## 2017-12-31 ENCOUNTER — Encounter: Payer: Self-pay | Admitting: Internal Medicine

## 2017-12-31 NOTE — Assessment & Plan Note (Addendum)
-   Spirometry 07/11/2017  Restrictive/ min curvature on no rx - 07/11/2017   Try symb 80 2bid - 09/30/2017     try symbicort 160 2bid as has rhonchi on exam, still smoking    - 12/30/2017  After extensive coaching inhaler device,  effectiveness =    75% (short Ti)   Mild non-specific flare, either due to viral uri/ allergy or flare of CB from smoking > rx is the same>>>  D/c all smoking Max rx for gerd while coughing  Prednisone 10 mg take  4 each am x 2 days,   2 each am x 2 days,  1 each am x 2 days and stop  Cough rx with mucinex dm max doses, supplement with tramadol when severe    >>> f/u 3 months unless fails to clear present flare with rx as above

## 2017-12-31 NOTE — Assessment & Plan Note (Addendum)
Counseled on impt of complete elimination of smoking focusing on short term benefit of relieving cough  Time for counseling  did not reach threshold for independent billing for this service.     I had an extended discussion with the patient reviewing all relevant studies completed to date and  lasting 15 to 20 minutes of a 25 minute visit    See device teaching which extended face to face time for this visit.  Each maintenance medication was reviewed in detail including emphasizing most importantly the difference between maintenance and prns and under what circumstances the prns are to be triggered using an action plan format that is not reflected in the computer generated alphabetically organized AVS which I have not found useful in most complex patients, especially with respiratory illnesses  Please see AVS for specific instructions unique to this visit that I personally wrote and verbalized to the the pt in detail and then reviewed with pt  by my nurse highlighting any  changes in therapy recommended at today's visit to their plan of care.

## 2018-01-01 DIAGNOSIS — D631 Anemia in chronic kidney disease: Secondary | ICD-10-CM | POA: Diagnosis not present

## 2018-01-01 DIAGNOSIS — N186 End stage renal disease: Secondary | ICD-10-CM | POA: Diagnosis not present

## 2018-01-01 DIAGNOSIS — N2581 Secondary hyperparathyroidism of renal origin: Secondary | ICD-10-CM | POA: Diagnosis not present

## 2018-01-01 DIAGNOSIS — D509 Iron deficiency anemia, unspecified: Secondary | ICD-10-CM | POA: Diagnosis not present

## 2018-01-03 DIAGNOSIS — D631 Anemia in chronic kidney disease: Secondary | ICD-10-CM | POA: Diagnosis not present

## 2018-01-03 DIAGNOSIS — N186 End stage renal disease: Secondary | ICD-10-CM | POA: Diagnosis not present

## 2018-01-03 DIAGNOSIS — D509 Iron deficiency anemia, unspecified: Secondary | ICD-10-CM | POA: Diagnosis not present

## 2018-01-03 DIAGNOSIS — N2581 Secondary hyperparathyroidism of renal origin: Secondary | ICD-10-CM | POA: Diagnosis not present

## 2018-01-06 DIAGNOSIS — D509 Iron deficiency anemia, unspecified: Secondary | ICD-10-CM | POA: Diagnosis not present

## 2018-01-06 DIAGNOSIS — N2581 Secondary hyperparathyroidism of renal origin: Secondary | ICD-10-CM | POA: Diagnosis not present

## 2018-01-06 DIAGNOSIS — D631 Anemia in chronic kidney disease: Secondary | ICD-10-CM | POA: Diagnosis not present

## 2018-01-06 DIAGNOSIS — N186 End stage renal disease: Secondary | ICD-10-CM | POA: Diagnosis not present

## 2018-01-08 DIAGNOSIS — D631 Anemia in chronic kidney disease: Secondary | ICD-10-CM | POA: Diagnosis not present

## 2018-01-08 DIAGNOSIS — D509 Iron deficiency anemia, unspecified: Secondary | ICD-10-CM | POA: Diagnosis not present

## 2018-01-08 DIAGNOSIS — N2581 Secondary hyperparathyroidism of renal origin: Secondary | ICD-10-CM | POA: Diagnosis not present

## 2018-01-08 DIAGNOSIS — N186 End stage renal disease: Secondary | ICD-10-CM | POA: Diagnosis not present

## 2018-01-10 DIAGNOSIS — D631 Anemia in chronic kidney disease: Secondary | ICD-10-CM | POA: Diagnosis not present

## 2018-01-10 DIAGNOSIS — D509 Iron deficiency anemia, unspecified: Secondary | ICD-10-CM | POA: Diagnosis not present

## 2018-01-10 DIAGNOSIS — N186 End stage renal disease: Secondary | ICD-10-CM | POA: Diagnosis not present

## 2018-01-10 DIAGNOSIS — N2581 Secondary hyperparathyroidism of renal origin: Secondary | ICD-10-CM | POA: Diagnosis not present

## 2018-01-12 DIAGNOSIS — N2581 Secondary hyperparathyroidism of renal origin: Secondary | ICD-10-CM | POA: Diagnosis not present

## 2018-01-12 DIAGNOSIS — D631 Anemia in chronic kidney disease: Secondary | ICD-10-CM | POA: Diagnosis not present

## 2018-01-12 DIAGNOSIS — N186 End stage renal disease: Secondary | ICD-10-CM | POA: Diagnosis not present

## 2018-01-12 DIAGNOSIS — D509 Iron deficiency anemia, unspecified: Secondary | ICD-10-CM | POA: Diagnosis not present

## 2018-01-14 ENCOUNTER — Ambulatory Visit (INDEPENDENT_AMBULATORY_CARE_PROVIDER_SITE_OTHER): Payer: Medicare Other | Admitting: Gastroenterology

## 2018-01-14 ENCOUNTER — Encounter: Payer: Self-pay | Admitting: Gastroenterology

## 2018-01-14 VITALS — BP 174/72 | HR 76 | Ht 67.0 in | Wt 221.2 lb

## 2018-01-14 DIAGNOSIS — K625 Hemorrhage of anus and rectum: Secondary | ICD-10-CM

## 2018-01-14 DIAGNOSIS — D631 Anemia in chronic kidney disease: Secondary | ICD-10-CM | POA: Diagnosis not present

## 2018-01-14 DIAGNOSIS — N2581 Secondary hyperparathyroidism of renal origin: Secondary | ICD-10-CM | POA: Diagnosis not present

## 2018-01-14 DIAGNOSIS — N186 End stage renal disease: Secondary | ICD-10-CM | POA: Diagnosis not present

## 2018-01-14 DIAGNOSIS — D509 Iron deficiency anemia, unspecified: Secondary | ICD-10-CM | POA: Diagnosis not present

## 2018-01-14 NOTE — Patient Instructions (Addendum)
Please start taking citrucel (orange flavored) powder fiber supplement.  This may cause some bloating at first but that usually goes away. Begin with a small spoonful and work your way up to a large, heaping spoonful daily over a week.  Please return to see Dr. Ardis Hughs in 2 months.  It was a pleasure to see you today!

## 2018-01-14 NOTE — Progress Notes (Signed)
Review of pertinent gastrointestinal problems: 1. Personal history of tubular adenomas, colonoscopy May 2009.Recommended to have repeat colonoscopy at 3 year interval. A reminder letter was sent in 2012. Colonoscopy 06/2011 Dr. Ardis Hughs, two subcentimeter polyps removed, were adenomatous. Recommended recall at 5 year interval.  Colonoscopy August 2018 found 3 polyps, one was 10 mm, all were adenomatous.  Recall at 3-year interval. 2. new anemia prompted EGD October 2009. Mild gastritis, duodenitis H. pylori positive on biopsy. 3. C diff TOXIN + diarrhea: 09/2015   HPI: This is a very pleasant 57 year old man whom I last saw at the time of a colonoscopy about a year and a half ago.  See those results summarized above  He was recently told that his Hb was 8s at dialysis.  Wife says 10-11 is usual for him.  Reviewing records in epic I see his hemoglobin around 9 mostly for the past couple years.  He does have mild intermittent rectal bleeding, bright red.  Tends to happen around the time of pushing and straining to move his bowels.  He says it has not happened at all in the past 2 or 3 weeks.  Chief complaint is anemia, rectal bleeding  Blood work June 2019 CBC was normal except for hemoglobin 11.7.  1 month prior his hemoglobin was between 8 and 9.  Creatinine 7.7.  Lipase 94  ROS: complete GI ROS as described in HPI, all other review negative.  Constitutional:  No unintentional weight loss   Past Medical History:  Diagnosis Date  . Acute edema of lung, unspecified   . Acute, but ill-defined, cerebrovascular disease   . Allergy   . Anemia   . 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)   . Anxiety   . Asthma   . Carpal tunnel syndrome   . Cellulitis and abscess of trunk   . Cholelithiasis 07/13/2014  . Chronic headaches   . Debility, unspecified   . Dermatophytosis of the body   . Dysrhythmia    history of  . Edema   . End stage renal disease on dialysis [facility])    "MWF; [facility]" (10/21/2014)  . Essential hypertension, benign   . GERD (gastroesophageal reflux disease)   . Gout, unspecified   . HTN (hypertension)   . Hypertrophy of prostate without urinary obstruction and other lower urinary tract symptoms (LUTS)   . Hypotension, unspecified   . Impotence of organic origin   . Insomnia, unspecified   . Kidney replaced by transplant   . Localization-related (focal) (partial) epilepsy and epileptic syndromes with complex partial seizures, without mention of intractable epilepsy   . Lumbago   . Memory loss   . OSA on CPAP   . Other and unspecified hyperlipidemia   . Other chronic nonalcoholic liver disease   . Other malaise and fatigue   . Other nonspecific abnormal serum enzyme levels   . Pain in joint, lower leg   . Pain in joint, upper arm   . Pneumonia "several times"  . Renal dialysis status(V45.11) 02/05/2010   restarted 01/02/13 ofter renal trransplant failure  . Secondary hyperparathyroidism (of renal origin)   . Shortness of breath   . Sleep apnea   . Tension headache   . Unspecified constipation   . Unspecified essential hypertension   . Unspecified hereditary and idiopathic peripheral neuropathy   . Unspecified vitamin D deficiency     Past Surgical History:  Procedure Laterality Date  . AV FISTULA PLACEMENT Left ?2010   "  forearm; at [facility]"  . BACK SURGERY    . CARDIAC CATHETERIZATION  03/21/2011  . CHOLECYSTECTOMY N/A 10/21/2014   Procedure: LAPAROSCOPIC CHOLECYSTECTOMY WITH INTRAOPERATIVE CHOLANGIOGRAM;  Surgeon: Autumn Messing III, MD;  Location: [facility];  Service: General;  Laterality: N/A;  . COLONOSCOPY    . 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 KIDNEY TRANSPLANT  08/17/2011   [place]  10/21/2014   w/IOC  . LEFT HEART CATHETERIZATION WITH CORONARY ANGIOGRAM N/A 03/21/2011   Procedure: LEFT HEART CATHETERIZATION WITH CORONARY ANGIOGRAM;  Surgeon:  [name], MD;  Location: [facility] C[facility];  Service: Cardiovascular;  Laterality: N/A;  . NEPHRECTOMY  08/2013   removed transplaned kidney  . POSTERIOR FUSION CERVICAL SPINE  06/25/2012   for spinal stenosis  . VASECTOMY  2010    Current Outpatient Medications  Medication Sig Dispense Refill  . acetaminophen (TYLENOL) 500 MG tablet Take 1 tablet (500 mg total) by mouth every 6 (six) hours as needed for moderate pain.    Marland Kitchen albuterol (PROVENTIL HFA;VENTOLIN HFA) 108 (90 Base) MCG/ACT inhaler Inhale 2 puffs into the lungs every 6 (six) hours as needed for wheezing or shortness of breath. 1 Inhaler 0  . aspirin EC 81 MG tablet Take 81 mg by mouth daily.    Marland Kitchen b complex-vitamin c-folic acid (NEPHRO-VITE) 0.8 MG TABS tablet TAKE 1 TABLET BY MOUTH EVERY DAY 90 tablet 1  . budesonide-formoterol (SYMBICORT) 160-4.5 MCG/ACT inhaler Take 2 puffs first thing in am and then another 2 puffs about 12 hours later. 1 Inhaler 12  . calcitRIOL (ROCALTROL) 0.25 MCG capsule Take 1 capsule (0.25 mcg total) by mouth every Monday, Wednesday, and Friday with hemodialysis. 30 capsule 1  . calcium acetate (PHOSLO) 667 MG capsule Take 8,786-7,672 mg by mouth See admin instructions. Take 5,336 mg by mouth three times a day with meals and 2,668 mg with each snack    . Cholecalciferol (VITAMIN D3) 5000 UNITS TABS Take 1 tablet by mouth daily.    . clonazePAM (KLONOPIN) 0.5 MG tablet TAKE 1 TABLET (0.5 MG TOTAL) BY MOUTH DAILY AS NEEDED FOR ANXIETY. 30 tablet 0  . diphenhydrAMINE (BENADRYL) 25 MG tablet Take 25 mg by mouth at bedtime as needed for allergies.    Marland Kitchen guaiFENesin-dextromethorphan (ROBITUSSIN DM) 100-10 MG/5ML syrup Take 10 mLs by mouth every 4 (four) hours as needed for cough.    . hydrALAZINE (APRESOLINE) 50 MG tablet Take 100 mg by mouth daily.    . metoprolol tartrate (LOPRESSOR) 50 MG tablet Take 75 mg by mouth 2 (two) times daily.    . metoprolol tartrate (LOPRESSOR) 50 MG tablet Take 1.5 tablets (75 mg  total) by mouth 2 (two) times daily. Take One and a half tablet by mouth twice daily 135 tablet 1  . omeprazole (PRILOSEC) 20 MG capsule TAKE 1 CAPSULE BY MOUTH 2 TIMES DAILY BEFORE A MEAL. 180 capsule 3  . ondansetron (ZOFRAN ODT) 4 MG disintegrating tablet Take 1 tablet (4 mg total) by mouth every 8 (eight) hours as needed for nausea or vomiting. 20 tablet 0  . pravastatin (PRAVACHOL) 40 MG tablet TAKE 1 TABLET EVERY EVENING 90 tablet 0  . Probiotic Product (PROBIOTIC-10 PO) Take 1 capsule by mouth daily.     . traMADol (ULTRAM) 50 MG tablet One every 4 hours as needed for cough 30 tablet 0  . UNABLE TO FIND CPAP: During all periods of rest (naps and  at bedtime)     No current facility-administered medications for this visit.     Allergies as of 01/14/2018 - Review Complete 01/14/2018  Allergen Reaction Noted  . Codeine Nausea And Vomiting 05/20/2007    Family History  Adopted: Yes  Problem Relation Age of Onset  . Colon cancer Neg Hx   . Esophageal cancer Neg Hx   . Rectal cancer Neg Hx   . Stomach cancer Neg Hx     Social History   Socioeconomic History  . Marital status: Married    Spouse name: [name]  . Number of children: 3  . Years of education: Not on file  . Highest education level: Not on file  Occupational History  . Occupation: disabled    Employer: DISABLED  Social Needs  . Financial resource strain: Not hard at all  . Food insecurity:    Worry: Never true    Inability: Never true  . Transportation needs:    Medical: No    Non-medical: No  Tobacco Use  . Smoking status: Current Every Day Smoker    Packs/day: 0.50    Years: 32.00    Pack years: 16.00    Types: E-cigarettes  . Smokeless tobacco: Never Used  . Tobacco comment: 1 cigarette every couple days  Substance and Sexual Activity  . Alcohol use: No    Alcohol/week: 0.0 standard drinks  . Drug use: No  . Sexual activity: Yes  Lifestyle  . Physical activity:    Days per week: 3 days     Minutes per session: 20 min  . Stress: Only a little  Relationships  . Social connections:    Talks on phone: More than three times a week    Gets together: More than three times a week    Attends religious service: More than 4 times per year    Active member of club or organization: No    Attends meetings of clubs or organizations: Never    Relationship status: Married  . Intimate partner violence:    Fear of current or ex partner: No    Emotionally abused: No    Physically abused: No    Forced sexual activity: No  Other Topics Concern  . Not on file  Social History Narrative   Drinks 1 cup of caffeine daily.     Physical Exam: BP (!) 174/72 (BP Location: Left Arm, Patient Position: Sitting, Cuff Size: Normal)   Pulse 76   Ht 5\' 7"  (1.702 m)   Wt 221 lb 4 oz (100.4 kg)   BMI 34.65 kg/m  Constitutional: generally well-appearing Psychiatric: alert and oriented x3 Abdomen: soft, nontender, nondistended, no obvious ascites, no peritoneal signs, normal bowel sounds No peripheral edema noted in lower extremities Rectal exam: No external anal hemorrhoids, no anal fissures.  There was suggestion of 1 or 2 soft swollen internal hemorrhoids by digital rectal exam, there was no stool in the vault.  Assessment and plan: 57 y.o. male with minor rectal bleeding multifactorial anemia  He has chronic renal failure and that certainly contributes to his anemia.  He has minor intermittent rectal bleeding.  His last colonoscopy was a year and a half ago and he is due for another 1 in 2021 for personal history of precancerous polyps.  I do not think that needs to be repeated any sooner than that since his bleeding really seems benign, anorectal in origin, likely from internal hemorrhoids.  I suggested a trial of fiber supplements to see  if we can keep him from pushing and straining.  He will call to report on his response in 2 or 3 months.  Please see the "Patient Instructions" section for  addition details about the plan.  Owens Loffler, MD [facility] 01/14/2018, 8:40 AM

## 2018-01-17 DIAGNOSIS — D631 Anemia in chronic kidney disease: Secondary | ICD-10-CM | POA: Diagnosis not present

## 2018-01-17 DIAGNOSIS — N186 End stage renal disease: Secondary | ICD-10-CM | POA: Diagnosis not present

## 2018-01-17 DIAGNOSIS — N2581 Secondary hyperparathyroidism of renal origin: Secondary | ICD-10-CM | POA: Diagnosis not present

## 2018-01-17 DIAGNOSIS — D509 Iron deficiency anemia, unspecified: Secondary | ICD-10-CM | POA: Diagnosis not present

## 2018-01-19 ENCOUNTER — Other Ambulatory Visit: Payer: Self-pay | Admitting: Internal Medicine

## 2018-01-19 DIAGNOSIS — D631 Anemia in chronic kidney disease: Secondary | ICD-10-CM | POA: Diagnosis not present

## 2018-01-19 DIAGNOSIS — I10 Essential (primary) hypertension: Secondary | ICD-10-CM

## 2018-01-19 DIAGNOSIS — D509 Iron deficiency anemia, unspecified: Secondary | ICD-10-CM | POA: Diagnosis not present

## 2018-01-19 DIAGNOSIS — N186 End stage renal disease: Secondary | ICD-10-CM | POA: Diagnosis not present

## 2018-01-19 DIAGNOSIS — N2581 Secondary hyperparathyroidism of renal origin: Secondary | ICD-10-CM | POA: Diagnosis not present

## 2018-01-21 DIAGNOSIS — D631 Anemia in chronic kidney disease: Secondary | ICD-10-CM | POA: Diagnosis not present

## 2018-01-21 DIAGNOSIS — N186 End stage renal disease: Secondary | ICD-10-CM | POA: Diagnosis not present

## 2018-01-21 DIAGNOSIS — D509 Iron deficiency anemia, unspecified: Secondary | ICD-10-CM | POA: Diagnosis not present

## 2018-01-21 DIAGNOSIS — N2581 Secondary hyperparathyroidism of renal origin: Secondary | ICD-10-CM | POA: Diagnosis not present

## 2018-01-22 DIAGNOSIS — Z992 Dependence on renal dialysis: Secondary | ICD-10-CM | POA: Diagnosis not present

## 2018-01-22 DIAGNOSIS — N186 End stage renal disease: Secondary | ICD-10-CM | POA: Diagnosis not present

## 2018-01-22 DIAGNOSIS — T8612 Kidney transplant failure: Secondary | ICD-10-CM | POA: Diagnosis not present

## 2018-01-23 ENCOUNTER — Telehealth: Payer: Self-pay | Admitting: Internal Medicine

## 2018-01-23 DIAGNOSIS — D631 Anemia in chronic kidney disease: Secondary | ICD-10-CM | POA: Diagnosis not present

## 2018-01-23 DIAGNOSIS — E8779 Other fluid overload: Secondary | ICD-10-CM | POA: Diagnosis not present

## 2018-01-23 DIAGNOSIS — N186 End stage renal disease: Secondary | ICD-10-CM | POA: Diagnosis not present

## 2018-01-23 DIAGNOSIS — D509 Iron deficiency anemia, unspecified: Secondary | ICD-10-CM | POA: Diagnosis not present

## 2018-01-23 DIAGNOSIS — N2581 Secondary hyperparathyroidism of renal origin: Secondary | ICD-10-CM | POA: Diagnosis not present

## 2018-01-23 DIAGNOSIS — R0609 Other forms of dyspnea: Principal | ICD-10-CM

## 2018-01-23 NOTE — Telephone Encounter (Signed)
Instructions   Stop robitussin and use maximum dose of mucinex dm up to 1200 mg every 12hours as needed and  Take tramadol 50 mg every 4 hours if needed   Prednisone 10 mg take  4 each am x 2 days,   2 each am x 2 days,  1 each am x 2 days and stop   Prilosec should be Take 30- 60 min before your first and last meals of the day   Of not improving need a cxr at the old building and office visit here asap    Please schedule a follow up visit in 3 months but call sooner if needed       Called and spoke with patient, advised her of response above from last OV. Order for cxr has been placed. Patient is aware to come here for that. Nothing further needed.

## 2018-01-24 ENCOUNTER — Ambulatory Visit
Admission: RE | Admit: 2018-01-24 | Discharge: 2018-01-24 | Disposition: A | Discharge status: Home / Self Care | Payer: Medicare Other | Source: Ambulatory Visit | Attending: Internal Medicine | Admitting: Internal Medicine

## 2018-01-24 ENCOUNTER — Ambulatory Visit (INDEPENDENT_AMBULATORY_CARE_PROVIDER_SITE_OTHER)
Admission: RE | Admit: 2018-01-24 | Discharge: 2018-01-24 | Disposition: A | Discharge status: Home / Self Care | Payer: Medicare Other | Source: Ambulatory Visit | Attending: Nurse Practitioner | Admitting: Nurse Practitioner

## 2018-01-24 ENCOUNTER — Encounter: Payer: Self-pay | Admitting: Nurse Practitioner

## 2018-01-24 ENCOUNTER — Ambulatory Visit (INDEPENDENT_AMBULATORY_CARE_PROVIDER_SITE_OTHER): Payer: Medicare Other | Admitting: Nurse Practitioner

## 2018-01-24 VITALS — BP 142/86 | HR 76 | Temp 98.4°F | Ht 66.25 in | Wt 219.8 lb

## 2018-01-24 DIAGNOSIS — R059 Cough, unspecified: Secondary | ICD-10-CM

## 2018-01-24 DIAGNOSIS — R05 Cough: Secondary | ICD-10-CM | POA: Diagnosis not present

## 2018-01-24 DIAGNOSIS — Z992 Dependence on renal dialysis: Secondary | ICD-10-CM

## 2018-01-24 DIAGNOSIS — N2581 Secondary hyperparathyroidism of renal origin: Secondary | ICD-10-CM | POA: Diagnosis not present

## 2018-01-24 DIAGNOSIS — N186 End stage renal disease: Secondary | ICD-10-CM

## 2018-01-24 DIAGNOSIS — R0609 Other forms of dyspnea: Principal | ICD-10-CM

## 2018-01-24 DIAGNOSIS — E8779 Other fluid overload: Secondary | ICD-10-CM | POA: Diagnosis not present

## 2018-01-24 DIAGNOSIS — D631 Anemia in chronic kidney disease: Secondary | ICD-10-CM | POA: Diagnosis not present

## 2018-01-24 DIAGNOSIS — D509 Iron deficiency anemia, unspecified: Secondary | ICD-10-CM | POA: Diagnosis not present

## 2018-01-24 NOTE — Progress Notes (Signed)
@Patient  ID: Kirk [name], male    DOB: Apr 01, 1960, 58 y.o.   MRN: [id number]  Chief Complaint  Patient presents with  . Cough    with green congestion    Referring provider: Gayland Curry, DO   HPI  58 year old male active smoker with mild persistent chronic asthma and OSA followed by Dr. Melvyn Novas. PMH: Hypertension, GERD, ESRD on dialysis, and chronic renal disease  Tests/events: - Spirometry 07/11/2017  Restrictive/ min curvature on no rx - 07/11/2017   Try symb 80 2bid - 09/30/2017     try symbicort 160 2bid as has rhonchi on exam, still smoking  - 12/30/2017  After extensive coaching inhaler device,  effectiveness =    75% (short Ti)  - CXR 10/01/17 chronic interstitial coarsening likely bronchitic - CXR 01/24/18 cardiomegaly with mild volume overload versus early edema pattern  OV 01/24/18 - cough Patient presents today with persistent cough.  He was ordered a prednisone taper on 01/23/2018 by Dr. Melvyn Novas and was advised to take Mucinex DM, tramadol, and Prilosec.  He states that his cough still persists.  Cough is productive of some green sputum.  He denies any sinus congestion or postnasal drip.  He is compliant with Symbicort and Proventil.  He is a dialysis patient on Tuesdays and Thursdays.  He denies any fever, shortness of breath, chest pain, or edema.. Chest x-ray in office today shows mild volume overload.  Patient admits that he still smokes.  He states that he smokes 1 cigarette every couple days.      Allergies  Allergen Reactions  . Codeine Nausea And Vomiting    Immunization History  Administered Date(s) Administered  . Influenza Whole 11/01/2010  . Influenza, Seasonal, Injecte, Preservative Fre 10/29/2012  . Influenza-Unspecified 11/21/2011, 11/05/2012, 09/22/2013  . PPD Test 12/26/2012  . Pneumococcal Polysaccharide-23 11/01/2010, 10/29/2012  . Pneumococcal-Unspecified 11/05/2012  . Tdap 01/22/2006    Past Medical History:  Diagnosis Date  . Acute edema of  lung, unspecified   . Acute, but ill-defined, cerebrovascular disease   . Allergy   . Anemia   . 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)   . Anxiety   . Asthma   . Carpal tunnel syndrome   . Cellulitis and abscess of trunk   . Cholelithiasis 07/13/2014  . Chronic headaches   . Debility, unspecified   . Dermatophytosis of the body   . Dysrhythmia    history of  . Edema   . End stage renal disease on dialysis [facility])    "MWF; [facility]" (10/21/2014)  . Essential hypertension, benign   . GERD (gastroesophageal reflux disease)   . Gout, unspecified   . HTN (hypertension)   . Hypertrophy of prostate without urinary obstruction and other lower urinary tract symptoms (LUTS)   . Hypotension, unspecified   . Impotence of organic origin   . Insomnia, unspecified   . Kidney replaced by transplant   . Localization-related (focal) (partial) epilepsy and epileptic syndromes with complex partial seizures, without mention of intractable epilepsy   . Lumbago   . Memory loss   . OSA on CPAP   . Other and unspecified hyperlipidemia   . Other chronic nonalcoholic liver disease   . Other malaise and fatigue   . Other nonspecific abnormal serum enzyme levels   . Pain in joint, lower leg   . Pain in joint, upper arm   . Pneumonia "several times"  . Renal dialysis status(V45.11) 02/05/2010   restarted 01/02/13 ofter  renal trransplant failure  . Secondary hyperparathyroidism (of renal origin)   . Shortness of breath   . Sleep apnea   . Tension headache   . Unspecified constipation   . Unspecified essential hypertension   . Unspecified hereditary and idiopathic peripheral neuropathy   . Unspecified vitamin D deficiency     Tobacco History: Social History   Tobacco Use  Smoking Status Current Every Day Smoker  . Packs/day: 0.50  . Years: 32.00  . Pack years: 16.00  . Types: E-cigarettes  Smokeless Tobacco Never Used  Tobacco Comment   1 cigarette every couple days     Ready to quit: No Counseling given: Yes Comment: 1 cigarette every couple days   Outpatient Encounter Medications as of 01/24/2018  Medication Sig  . acetaminophen (TYLENOL) 500 MG tablet Take 1 tablet (500 mg total) by mouth every 6 (six) hours as needed for moderate pain.  Marland Kitchen albuterol (PROVENTIL HFA;VENTOLIN HFA) 108 (90 Base) MCG/ACT inhaler Inhale 2 puffs into the lungs every 6 (six) hours as needed for wheezing or shortness of breath.  Marland Kitchen aspirin EC 81 MG tablet Take 81 mg by mouth daily.  Marland Kitchen b complex-vitamin c-folic acid (NEPHRO-VITE) 0.8 MG TABS tablet TAKE 1 TABLET BY MOUTH EVERY DAY  . budesonide-formoterol (SYMBICORT) 160-4.5 MCG/ACT inhaler Take 2 puffs first thing in am and then another 2 puffs about 12 hours later.  . calcitRIOL (ROCALTROL) 0.25 MCG capsule Take 1 capsule (0.25 mcg total) by mouth every Monday, Wednesday, and Friday with hemodialysis.  Marland Kitchen calcium acetate (PHOSLO) 667 MG capsule Take 5,009-3,818 mg by mouth See admin instructions. Take 5,336 mg by mouth three times a day with meals and 2,668 mg with each snack  . Cholecalciferol (VITAMIN D3) 5000 UNITS TABS Take 1 tablet by mouth daily.  . clonazePAM (KLONOPIN) 0.5 MG tablet TAKE 1 TABLET (0.5 MG TOTAL) BY MOUTH DAILY AS NEEDED FOR ANXIETY.  . diphenhydrAMINE (BENADRYL) 25 MG tablet Take 25 mg by mouth at bedtime as needed for allergies.  Marland Kitchen guaiFENesin-dextromethorphan (ROBITUSSIN DM) 100-10 MG/5ML syrup Take 10 mLs by mouth every 4 (four) hours as needed for cough.  . hydrALAZINE (APRESOLINE) 50 MG tablet Take 100 mg by mouth daily.  . metoprolol tartrate (LOPRESSOR) 50 MG tablet Take 75 mg by mouth 2 (two) times daily.  . metoprolol tartrate (LOPRESSOR) 50 MG tablet Take 1.5 tablets (75 mg total) by mouth 2 (two) times daily. Take One and a half tablet by mouth twice daily  . omeprazole (PRILOSEC) 20 MG capsule TAKE 1 CAPSULE BY MOUTH 2 TIMES DAILY BEFORE A MEAL.  Marland Kitchen ondansetron (ZOFRAN ODT) 4 MG disintegrating  tablet Take 1 tablet (4 mg total) by mouth every 8 (eight) hours as needed for nausea or vomiting.  . pravastatin (PRAVACHOL) 40 MG tablet TAKE 1 TABLET EVERY EVENING  . Probiotic Product (PROBIOTIC-10 PO) Take 1 capsule by mouth daily.   . traMADol (ULTRAM) 50 MG tablet One every 4 hours as needed for cough  . UNABLE TO FIND CPAP: During all periods of rest (naps and at bedtime)   No facility-administered encounter medications on file as of 01/24/2018.      Review of Systems  Review of Systems  Constitutional: Negative.  Negative for chills and fever.  HENT: Negative.  Negative for congestion.   Respiratory: Positive for cough. Negative for shortness of breath.   Cardiovascular: Negative.  Negative for chest pain, palpitations and leg swelling.  Gastrointestinal: Negative.   Allergic/Immunologic: Negative.  Neurological: Negative.   Psychiatric/Behavioral: Negative.        Physical Exam  BP (!) 142/86 (BP Location: Right Arm, Patient Position: Sitting, Cuff Size: Normal)   Pulse 76   Temp 98.4 F (36.9 C)   Ht 5' 6.25" (1.683 m)   Wt 219 lb 12.8 oz (99.7 kg)   SpO2 98%   BMI 35.21 kg/m   Wt Readings from Last 5 Encounters:  01/24/18 219 lb 12.8 oz (99.7 kg)  01/14/18 221 lb 4 oz (100.4 kg)  12/30/17 222 lb (100.7 kg)  09/30/17 222 lb (100.7 kg)  07/11/17 217 lb (98.4 kg)     Physical Exam Vitals signs and nursing note reviewed.  Constitutional:      General: He is not in acute distress.    Appearance: He is well-developed.  Cardiovascular:     Rate and Rhythm: Normal rate and regular rhythm.  Pulmonary:     Effort: Pulmonary effort is normal. No respiratory distress.     Breath sounds: Normal breath sounds. No wheezing or rhonchi.  Musculoskeletal:        General: No swelling.  Skin:    General: Skin is warm and dry.  Neurological:     Mental Status: He is alert and oriented to person, place, and time.     Imaging: Dg Chest 2 View  Result Date:  01/24/2018 CLINICAL DATA:  Cough, congestion, hypertension, hemodialysis patient EXAM: CHEST - 2 VIEW COMPARISON:  09/30/2017 FINDINGS: Increased cardiomegaly with diffuse vascular and interstitial prominence throughout the mid and lower lungs suggesting mild volume overload or early edema. No definite focal pneumonia, collapse or consolidation. No effusion or pneumothorax. Trachea is midline. Lower cervical fusion hardware noted. Remote cholecystectomy. IMPRESSION: Cardiomegaly with mild volume overload versus early edema pattern. Electronically Signed   By: Jerilynn Mages.  Shick M.D.   On: 01/24/2018 15:24     Assessment & Plan:   ESRD on dialysis [facility]) Chest x-ray in office today showed mild volume overload versus early edema pattern.  Patient will need more fluid taken off at next dialysis.   Patient Instructions  Chest x ray showed fluid overload Patient is on dialysis - will need more fluid taken off Please follow up with dialysis and cardiology mucinex dm up to 1200 mg every 12hours as needed and  Take tramadol 50 mg every 4 hours if needed  Prilosec should be Take 30- 60 min before your first and last meals of the day  Follow up with Dr. Melvyn Novas at his 1st available appointment    Cough Patient Instructions  Chest x ray showed fluid overload Patient is on dialysis - will need more fluid taken off Please follow up with dialysis and cardiology mucinex dm up to 1200 mg every 12hours as needed and  Take tramadol 50 mg every 4 hours if needed  Prilosec should be Take 30- 60 min before your first and last meals of the day  Follow up with Dr. Melvyn Novas at his 1st available appointment  Cough: Continue gastroesophageal reflux disease treatment with elevating the head your bed and taking antacids Continue taking over-the-counter antihistamines and nasal fluticasone to help with allergic rhinitis You need to try to suppress your cough to allow your larynx (voice box) to heal.  For three days don't talk,  laugh, sing, or clear your throat. Do everything you can to suppress the cough during this time. Use hard candies (sugarless Jolly Ranchers) or non-mint or non-menthol containing cough drops during this time to soothe  your throat.  Use a cough suppressant (Delsym or what I have prescribed you) around the clock during this time.  After three days, gradually increase the use of your voice and back off on the cough suppressants.  Stop smoking!      Fenton Foy, NP 01/27/2018

## 2018-01-24 NOTE — Patient Instructions (Addendum)
Chest x ray showed fluid overload Patient is on dialysis - will need more fluid taken off Please follow up with dialysis and cardiology mucinex dm up to 1200 mg every 12hours as needed and  Take tramadol 50 mg every 4 hours if needed  Prilosec should be Take 30- 60 min before your first and last meals of the day  Follow up with Dr. Melvyn Novas at his 1st available appointment

## 2018-01-27 ENCOUNTER — Encounter (HOSPITAL_COMMUNITY): Payer: Self-pay | Admitting: *Deleted

## 2018-01-27 ENCOUNTER — Inpatient Hospital Stay (HOSPITAL_COMMUNITY)
Admission: EM | Admit: 2018-01-27 | Discharge: 2018-01-30 | DRG: 291 | Disposition: A | Discharge status: Home / Self Care | Payer: Medicare Other | Attending: Family Medicine | Admitting: Family Medicine

## 2018-01-27 ENCOUNTER — Emergency Department (HOSPITAL_COMMUNITY): Payer: Medicare Other

## 2018-01-27 ENCOUNTER — Encounter: Payer: Self-pay | Admitting: Nurse Practitioner

## 2018-01-27 DIAGNOSIS — I160 Hypertensive urgency: Secondary | ICD-10-CM | POA: Diagnosis present

## 2018-01-27 DIAGNOSIS — F17210 Nicotine dependence, cigarettes, uncomplicated: Secondary | ICD-10-CM | POA: Diagnosis present

## 2018-01-27 DIAGNOSIS — Z992 Dependence on renal dialysis: Secondary | ICD-10-CM | POA: Diagnosis not present

## 2018-01-27 DIAGNOSIS — I5033 Acute on chronic diastolic (congestive) heart failure: Secondary | ICD-10-CM | POA: Diagnosis not present

## 2018-01-27 DIAGNOSIS — R059 Cough, unspecified: Secondary | ICD-10-CM | POA: Insufficient documentation

## 2018-01-27 DIAGNOSIS — Z79899 Other long term (current) drug therapy: Secondary | ICD-10-CM

## 2018-01-27 DIAGNOSIS — K648 Other hemorrhoids: Secondary | ICD-10-CM | POA: Diagnosis present

## 2018-01-27 DIAGNOSIS — Z9989 Dependence on other enabling machines and devices: Secondary | ICD-10-CM | POA: Diagnosis not present

## 2018-01-27 DIAGNOSIS — N185 Chronic kidney disease, stage 5: Secondary | ICD-10-CM | POA: Diagnosis not present

## 2018-01-27 DIAGNOSIS — I132 Hypertensive heart and chronic kidney disease with heart failure and with stage 5 chronic kidney disease, or end stage renal disease: Principal | ICD-10-CM | POA: Diagnosis present

## 2018-01-27 DIAGNOSIS — J45901 Unspecified asthma with (acute) exacerbation: Secondary | ICD-10-CM | POA: Diagnosis not present

## 2018-01-27 DIAGNOSIS — J9601 Acute respiratory failure with hypoxia: Secondary | ICD-10-CM | POA: Diagnosis present

## 2018-01-27 DIAGNOSIS — N2581 Secondary hyperparathyroidism of renal origin: Secondary | ICD-10-CM | POA: Diagnosis not present

## 2018-01-27 DIAGNOSIS — R7989 Other specified abnormal findings of blood chemistry: Secondary | ICD-10-CM | POA: Diagnosis present

## 2018-01-27 DIAGNOSIS — E785 Hyperlipidemia, unspecified: Secondary | ICD-10-CM | POA: Diagnosis present

## 2018-01-27 DIAGNOSIS — T8612 Kidney transplant failure: Secondary | ICD-10-CM | POA: Diagnosis not present

## 2018-01-27 DIAGNOSIS — N186 End stage renal disease: Secondary | ICD-10-CM

## 2018-01-27 DIAGNOSIS — D61818 Other pancytopenia: Secondary | ICD-10-CM | POA: Diagnosis present

## 2018-01-27 DIAGNOSIS — I10 Essential (primary) hypertension: Secondary | ICD-10-CM | POA: Diagnosis not present

## 2018-01-27 DIAGNOSIS — D509 Iron deficiency anemia, unspecified: Secondary | ICD-10-CM | POA: Diagnosis not present

## 2018-01-27 DIAGNOSIS — R079 Chest pain, unspecified: Secondary | ICD-10-CM | POA: Diagnosis present

## 2018-01-27 DIAGNOSIS — Z7951 Long term (current) use of inhaled steroids: Secondary | ICD-10-CM

## 2018-01-27 DIAGNOSIS — F17290 Nicotine dependence, other tobacco product, uncomplicated: Secondary | ICD-10-CM | POA: Diagnosis present

## 2018-01-27 DIAGNOSIS — G4733 Obstructive sleep apnea (adult) (pediatric): Secondary | ICD-10-CM | POA: Diagnosis not present

## 2018-01-27 DIAGNOSIS — K922 Gastrointestinal hemorrhage, unspecified: Secondary | ICD-10-CM | POA: Diagnosis present

## 2018-01-27 DIAGNOSIS — I130 Hypertensive heart and chronic kidney disease with heart failure and stage 1 through stage 4 chronic kidney disease, or unspecified chronic kidney disease: Secondary | ICD-10-CM | POA: Diagnosis not present

## 2018-01-27 DIAGNOSIS — N189 Chronic kidney disease, unspecified: Secondary | ICD-10-CM

## 2018-01-27 DIAGNOSIS — R778 Other specified abnormalities of plasma proteins: Secondary | ICD-10-CM | POA: Diagnosis present

## 2018-01-27 DIAGNOSIS — I4581 Long QT syndrome: Secondary | ICD-10-CM | POA: Diagnosis not present

## 2018-01-27 DIAGNOSIS — R0989 Other specified symptoms and signs involving the circulatory and respiratory systems: Secondary | ICD-10-CM | POA: Diagnosis not present

## 2018-01-27 DIAGNOSIS — K769 Liver disease, unspecified: Secondary | ICD-10-CM | POA: Diagnosis present

## 2018-01-27 DIAGNOSIS — Z885 Allergy status to narcotic agent status: Secondary | ICD-10-CM

## 2018-01-27 DIAGNOSIS — E876 Hypokalemia: Secondary | ICD-10-CM | POA: Diagnosis not present

## 2018-01-27 DIAGNOSIS — D631 Anemia in chronic kidney disease: Secondary | ICD-10-CM | POA: Diagnosis not present

## 2018-01-27 DIAGNOSIS — Z7982 Long term (current) use of aspirin: Secondary | ICD-10-CM

## 2018-01-27 DIAGNOSIS — D649 Anemia, unspecified: Secondary | ICD-10-CM | POA: Diagnosis not present

## 2018-01-27 DIAGNOSIS — R0602 Shortness of breath: Secondary | ICD-10-CM | POA: Diagnosis not present

## 2018-01-27 DIAGNOSIS — E8779 Other fluid overload: Secondary | ICD-10-CM | POA: Diagnosis not present

## 2018-01-27 DIAGNOSIS — Y830 Surgical operation with transplant of whole organ as the cause of abnormal reaction of the patient, or of later complication, without mention of misadventure at the time of the procedure: Secondary | ICD-10-CM | POA: Diagnosis present

## 2018-01-27 DIAGNOSIS — D696 Thrombocytopenia, unspecified: Secondary | ICD-10-CM | POA: Diagnosis present

## 2018-01-27 DIAGNOSIS — Z905 Acquired absence of kidney: Secondary | ICD-10-CM

## 2018-01-27 DIAGNOSIS — K219 Gastro-esophageal reflux disease without esophagitis: Secondary | ICD-10-CM | POA: Diagnosis present

## 2018-01-27 DIAGNOSIS — K921 Melena: Secondary | ICD-10-CM | POA: Diagnosis present

## 2018-01-27 DIAGNOSIS — R05 Cough: Secondary | ICD-10-CM | POA: Insufficient documentation

## 2018-01-27 DIAGNOSIS — I480 Paroxysmal atrial fibrillation: Secondary | ICD-10-CM | POA: Diagnosis present

## 2018-01-27 DIAGNOSIS — I120 Hypertensive chronic kidney disease with stage 5 chronic kidney disease or end stage renal disease: Secondary | ICD-10-CM | POA: Diagnosis not present

## 2018-01-27 LAB — BASIC METABOLIC PANEL
ANION GAP: 11 (ref 5–15)
BUN: 18 mg/dL (ref 6–20)
CHLORIDE: 99 mmol/L (ref 98–111)
CO2: 29 mmol/L (ref 22–32)
Calcium: 8.7 mg/dL — ABNORMAL LOW (ref 8.9–10.3)
Creatinine, Ser: 8.45 mg/dL — ABNORMAL HIGH (ref 0.61–1.24)
GFR calc Af Amer: 7 mL/min — ABNORMAL LOW (ref >60)
GFR calc non Af Amer: 6 mL/min — ABNORMAL LOW (ref >60)
GLUCOSE: 146 mg/dL — AB (ref 70–99)
POTASSIUM: 3.3 mmol/L — AB (ref 3.5–5.1)
Sodium: 139 mmol/L (ref 135–145)

## 2018-01-27 LAB — CBC WITH DIFFERENTIAL/PLATELET
ABS IMMATURE GRANULOCYTES: 0.01 10*3/uL (ref 0.00–0.07)
Basophils Absolute: 0 10*3/uL (ref 0.0–0.1)
Basophils Relative: 1 %
Eosinophils Absolute: 0.1 10*3/uL (ref 0.0–0.5)
Eosinophils Relative: 3 %
HEMATOCRIT: 24.9 % — AB (ref 39.0–52.0)
HEMOGLOBIN: 7.8 g/dL — AB (ref 13.0–17.0)
IMMATURE GRANULOCYTES: 0 %
LYMPHS ABS: 0.6 10*3/uL — AB (ref 0.7–4.0)
LYMPHS PCT: 15 %
MCH: 31.7 pg (ref 26.0–34.0)
MCHC: 31.3 g/dL (ref 30.0–36.0)
MCV: 101.2 fL — AB (ref 80.0–100.0)
MONO ABS: 0.4 10*3/uL (ref 0.1–1.0)
Monocytes Relative: 12 %
NEUTROS ABS: 2.6 10*3/uL (ref 1.7–7.7)
NEUTROS PCT: 69 %
Platelets: 97 10*3/uL — ABNORMAL LOW (ref 150–400)
RBC: 2.46 MIL/uL — ABNORMAL LOW (ref 4.22–5.81)
RDW: 14.3 % (ref 11.5–15.5)
WBC: 3.7 10*3/uL — ABNORMAL LOW (ref 4.0–10.5)
nRBC: 0 % (ref 0.0–0.2)

## 2018-01-27 LAB — I-STAT TROPONIN, ED: Troponin i, poc: 0.03 ng/mL (ref 0.00–0.08)

## 2018-01-27 LAB — BRAIN NATRIURETIC PEPTIDE: B NATRIURETIC PEPTIDE 5: 951.2 pg/mL — AB (ref 0.0–100.0)

## 2018-01-27 MED ORDER — SODIUM CHLORIDE 0.9% IV SOLUTION: Freq: Once | INTRAVENOUS | Status: DC

## 2018-01-27 MED ORDER — ALBUTEROL SULFATE (2.5 MG/3ML) 0.083% IN NEBU
5.0000 mg | INHALATION_SOLUTION | Freq: Once | RESPIRATORY_TRACT | Status: AC
  Administered 2018-01-27: 5 mg via RESPIRATORY_TRACT
  Filled 2018-01-27: qty 6

## 2018-01-27 NOTE — ED Provider Notes (Signed)
[place] EMERGENCY DEPARTMENT Provider Note   CSN: [id number] Arrival date & time: 01/27/18  1406     History   Chief Complaint Chief Complaint  Patient presents with  . Cough    HPI EAGAN SHIFFLETT is a 58 y.o. male.  The history is provided by the patient and medical records. No language interpreter was used.  Cough   SHERIF MILLSPAUGH is a 58 y.o. male who presents to the Emergency Department complaining of sob, cough. Resents to the emergency department for evaluation of shortness of breath and cough that is been present for the last two months. He has associated bilateral chest pain that is worse with coughing. No reports of fevers but he does have hot flashes and cold chills at times. No vomiting, diarrhea, leg swelling or pain. He has ESR D and is on hemodialysis Monday, Wednesday, Friday. He has experienced similar symptoms in the past and has been diagnosed with pneumonia. He has been seen by pulmonary twice for the symptoms and had negative chest x-rays on the visit. He has associated generalized weakness as well as dyspnea on exertion. He also reports chronic intermittent hematochezia that has been evaluated by [facility]. Past Medical History:  Diagnosis Date  . Acute edema of lung, unspecified   . Acute, but ill-defined, cerebrovascular disease   . Allergy   . Anemia   . 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)   . Anxiety   . Asthma   . Carpal tunnel syndrome   . Cellulitis and abscess of trunk   . Cholelithiasis 07/13/2014  . Chronic headaches   . Debility, unspecified   . Dermatophytosis of the body   . Dysrhythmia    history of  . Edema   . End stage renal disease on dialysis [facility])    "MWF; [facility]" (10/21/2014)  . Essential hypertension, benign   . GERD (gastroesophageal reflux disease)   . Gout, unspecified   . HTN (hypertension)   . Hypertrophy of prostate without urinary obstruction and other lower urinary tract  symptoms (LUTS)   . Hypotension, unspecified   . Impotence of organic origin   . Insomnia, unspecified   . Kidney replaced by transplant   . Localization-related (focal) (partial) epilepsy and epileptic syndromes with complex partial seizures, without mention of intractable epilepsy   . Lumbago   . Memory loss   . OSA on CPAP   . Other and unspecified hyperlipidemia   . Other chronic nonalcoholic liver disease   . Other malaise and fatigue   . Other nonspecific abnormal serum enzyme levels   . Pain in joint, lower leg   . Pain in joint, upper arm   . Pneumonia "several times"  . Renal dialysis status(V45.11) 02/05/2010   restarted 01/02/13 ofter renal trransplant failure  . Secondary hyperparathyroidism (of renal origin)   . Shortness of breath   . Sleep apnea   . Tension headache   . Unspecified constipation   . Unspecified essential hypertension   . Unspecified hereditary and idiopathic peripheral neuropathy   . Unspecified vitamin D deficiency     Patient Active Problem List   Diagnosis Date Noted  . Cough 01/27/2018  . Cigarette smoker 07/11/2017  . ESRD (end stage renal disease) ([place]) 05/30/2017  . Anxiety 05/30/2017  . HCAP (healthcare-associated pneumonia) 05/30/2017  . Hypokalemia   . Acute respiratory failure with hypoxia ([place]) 01/28/2017  . 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  . Acute respiratory distress 10/31/2016  .  Shunt malfunction 07/03/2016  . End stage renal disease ([place])   . Need for hepatitis C screening test 06/29/2015  . Fatigue 06/29/2015  . Myalgia 05/17/2015  . Acute dyspnea   . 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 Obesity hypoventilation syndrome ([place]) 12/09/2014  . Extrinsic asthma 12/09/2014  . Narcotic drug use 12/09/2014  . Hypersomnia with sleep apnea 12/09/2014  . SCC (squamous cell carcinoma), arm 11/09/2014  . Mild persistent chronic asthma without complication 70/26/3785  . ESRD on dialysis ([place]) 09/09/2014  . HLD (hyperlipidemia)  09/09/2014  . DOE (dyspnea on exertion) 06/23/2014  . Pancytopenia ([place]) 04/28/2014  . Thrombocytopenia ([place]) 04/10/2014  . Fungal dermatitis 03/17/2014  . Abdominal pain   . History of surgical procedure 09/17/2013  . Awaiting organ transplant 02/13/2013  . HTN (hypertension)   . Tension headache   . Memory loss   . Anemia in chronic renal disease   . Edema   . Thoracic or lumbosacral neuritis or radiculitis 03/27/2012  . Nerve root pain 03/27/2012  . History of kidney transplant 09/10/2011  . Hearing loss 08/16/2011  . Obesity 08/16/2011  . Difficulty hearing 08/16/2011  . OSA on CPAP 08/05/2011    Past Surgical History:  Procedure Laterality Date  . AV FISTULA PLACEMENT Left ?2010   "forearm; at [facility]"  . BACK SURGERY    . CARDIAC CATHETERIZATION  03/21/2011  . CHOLECYSTECTOMY N/A 10/21/2014   Procedure: LAPAROSCOPIC CHOLECYSTECTOMY WITH INTRAOPERATIVE CHOLANGIOGRAM;  Surgeon: Autumn Messing III, MD;  Location: [facility];  Service: General;  Laterality: N/A;  . COLONOSCOPY    . 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 KIDNEY TRANSPLANT  08/17/2011   [place]  10/21/2014   w/IOC  . LEFT HEART CATHETERIZATION WITH CORONARY ANGIOGRAM N/A 03/21/2011   Procedure: LEFT HEART CATHETERIZATION WITH CORONARY ANGIOGRAM;  Surgeon: [name], MD;  Location[facility] CATH LAB;  Service: Cardiovascular;  Laterality: N/A;  . NEPHRECTOMY  08/2013   removed transplaned kidney  . POSTERIOR FUSION CERVICAL SPINE  06/25/2012   for spinal stenosis  . VASECTOMY  2010        Home Medications    Prior to Admission medications   Medication Sig Start Date End Date Taking? Authorizing Provider  acetaminophen (TYLENOL) 500 MG tablet Take 1 tablet (500 mg total) by mouth every 6 (six) hours as needed for moderate pain. 09/12/14  Yes Samuella Cota, MD  albuterol (PROVENTIL HFA;VENTOLIN HFA) 108 (90 Base) MCG/ACT inhaler Inhale 2 puffs  into the lungs every 6 (six) hours as needed for wheezing or shortness of breath. 06/05/17  Yes Patrecia Pour, MD  aspirin EC 81 MG tablet Take 81 mg by mouth daily.   Yes [provider]  b complex-vitamin c-folic acid (NEPHRO-VITE) 0.8 MG TABS tablet TAKE 1 TABLET BY MOUTH EVERY DAY Patient taking differently: Take 1 tablet by mouth daily.  09/19/17  Yes Reed, Tiffany L, DO  budesonide-formoterol (SYMBICORT) 160-4.5 MCG/ACT inhaler Take 2 puffs first thing in am and then another 2 puffs about 12 hours later. 10/02/17  Yes Tanda Rockers, MD  calcitRIOL (ROCALTROL) 0.25 MCG capsule Take 1 capsule (0.25 mcg total) by mouth every Monday, Wednesday, and Friday with hemodialysis. 09/21/14  Yes Bonnielee Haff, MD  Cholecalciferol (VITAMIN D3) 5000 UNITS TABS Take 1 tablet by mouth daily.   Yes [provider]  clonazePAM (KLONOPIN) 0.5 MG tablet TAKE 1 TABLET (0.5 MG  TOTAL) BY MOUTH DAILY AS NEEDED FOR ANXIETY. 10/23/17  Yes Reed, Tiffany L, DO  diphenhydrAMINE (BENADRYL) 25 MG tablet Take 25 mg by mouth at bedtime as needed for allergies.   Yes [provider]  guaiFENesin-dextromethorphan (ROBITUSSIN DM) 100-10 MG/5ML syrup Take 10 mLs by mouth every 4 (four) hours as needed for cough.   Yes [provider]  hydrALAZINE (APRESOLINE) 50 MG tablet Take 100 mg by mouth daily.   Yes [provider]  metoprolol tartrate (LOPRESSOR) 50 MG tablet Take 1.5 tablets (75 mg total) by mouth 2 (two) times daily. Take One and a half tablet by mouth twice daily 10/25/17  Yes Reed, Tiffany L, DO  omeprazole (PRILOSEC) 20 MG capsule TAKE 1 CAPSULE BY MOUTH 2 TIMES DAILY BEFORE A MEAL. Patient taking differently: Take 20 mg by mouth 2 (two) times daily before a meal.  12/26/16  Yes Milus Banister, MD  ondansetron (ZOFRAN ODT) 4 MG disintegrating tablet Take 1 tablet (4 mg total) by mouth every 8 (eight) hours as needed for nausea or vomiting. 10/26/16  Yes Carmon Sails J, PA-C    pravastatin (PRAVACHOL) 40 MG tablet TAKE 1 TABLET EVERY EVENING Patient taking differently: Take 40 mg by mouth daily.  10/25/17  Yes Reed, Tiffany L, DO  Probiotic Product (PROBIOTIC-10 PO) Take 1 capsule by mouth daily.    Yes [provider]  sucroferric oxyhydroxide (VELPHORO) 500 MG chewable tablet Chew 500-1,500 mg by mouth See admin instructions. Take 1500 mg with meals and 500 mg with snacks   Yes [provider]  traMADol (ULTRAM) 50 MG tablet One every 4 hours as needed for cough Patient taking differently: Take 50 mg by mouth every 6 (six) hours as needed for moderate pain (cough).  12/30/17  Yes Tanda Rockers, MD  UNABLE TO FIND CPAP: During all periods of rest (naps and at bedtime)   Yes [provider]    Family History Family History  Adopted: Yes  Problem Relation Age of Onset  . Colon cancer Neg Hx   . Esophageal cancer Neg Hx   . Rectal cancer Neg Hx   . Stomach cancer Neg Hx     Social History Social History   Tobacco Use  . Smoking status: Current Every Day Smoker    Packs/day: 0.50    Years: 32.00    Pack years: 16.00    Types: E-cigarettes  . Smokeless tobacco: Never Used  . Tobacco comment: 1 cigarette every couple days  Substance Use Topics  . Alcohol use: No    Alcohol/week: 0.0 standard drinks  . Drug use: No     Allergies   Codeine   Review of Systems Review of Systems  Respiratory: Positive for cough.   All other systems reviewed and are negative.    Physical Exam Updated Vital Signs BP (!) 146/84 (BP Location: Right Arm)   Pulse 86   Temp 98 F (36.7 C) (Oral)   Resp (!) 23   SpO2 100%   Physical Exam Vitals signs and nursing note reviewed.  Constitutional:      Appearance: He is well-developed.  HENT:     Head: Normocephalic and atraumatic.  Cardiovascular:     Rate and Rhythm: Normal rate and regular rhythm.     Heart sounds: No murmur.  Pulmonary:     Effort: Pulmonary effort is normal. No  respiratory distress.     Comments: Decreased air movement bilaterally Abdominal:     Palpations:  Abdomen is soft.     Tenderness: There is no abdominal tenderness. There is no guarding or rebound.  Musculoskeletal:        General: No tenderness.     Comments: Trace non-pitting edema to bilateral lower extremities. Fistula in the left upper extremity.  Skin:    General: Skin is warm and dry.  Neurological:     Mental Status: He is alert and oriented to person, place, and time.  Psychiatric:        Mood and Affect: Mood normal.        Behavior: Behavior normal.      ED Treatments / Results  Labs (all labs ordered are listed, but only abnormal results are displayed) Labs Reviewed  BASIC METABOLIC PANEL - Abnormal; Notable for the following components:      Result Value   Potassium 3.3 (*)    Glucose, Bld 146 (*)    Creatinine, Ser 8.45 (*)    Calcium 8.7 (*)    GFR calc non Af Amer 6 (*)    GFR calc Af Amer 7 (*)    All other components within normal limits  CBC WITH DIFFERENTIAL/PLATELET - Abnormal; Notable for the following components:   WBC 3.7 (*)    RBC 2.46 (*)    Hemoglobin 7.8 (*)    HCT 24.9 (*)    MCV 101.2 (*)    Platelets 97 (*)    Lymphs Abs 0.6 (*)    All other components within normal limits  BRAIN NATRIURETIC PEPTIDE - Abnormal; Notable for the following components:   B Natriuretic Peptide 951.2 (*)    All other components within normal limits  I-STAT TROPONIN, ED  PREPARE RBC (CROSSMATCH)  TYPE AND SCREEN    EKG EKG Interpretation  Date/Time:  Monday January 27 2018 20:59:28 EST Ventricular Rate:  94 PR Interval:    QRS Duration: 86 QT Interval:  421 QTC Calculation: 527 R Axis:   73 Text Interpretation:  Sinus rhythm Consider left ventricular hypertrophy Prolonged QT interval No significant change since last tracing Confirmed by Quintella Reichert [phone]) on 01/27/2018 9:23:11 PM   Radiology Dg Chest 2 View  Result Date: 01/27/2018 CLINICAL  DATA:  Productive cough.  Shortness of breath. EXAM: CHEST - 2 VIEW COMPARISON:  Radiographs of January 24, 2018. FINDINGS: Stable cardiomegaly with central pulmonary vascular congestion. No pneumothorax or pleural effusion is noted. No consolidative process is noted. Bony thorax is unremarkable. IMPRESSION: Stable cardiomegaly with central pulmonary vascular congestion. Electronically Signed   By: Marijo Conception, M.D.   On: 01/27/2018 21:39    Procedures Procedures (including critical care time)  Medications Ordered in ED Medications  0.9 %  sodium chloride infusion (Manually program via Guardrails IV Fluids) (has no administration in time range)  albuterol (PROVENTIL) (2.5 MG/3ML) 0.083% nebulizer solution 5 mg (5 mg Nebulization Given 01/27/18 2112)     Initial Impression / Assessment and Plan / ED Course  I have reviewed the triage vital signs and the nursing notes.  Pertinent labs & imaging results that were available during my care of the patient were reviewed by me and considered in my medical decision making (see chart for details).     Pt with ESRD on HD here for evaluation of progressive cough.  Pt with hypoxia on ambulation, CXR with pulmonary vascular congestion.  CBC with worsening anemia, BNP mildly elevated.  Concern for symptomatic anemia with coexistant HF.  Medicine consulted for admission for further treatment, possible  transfusion with dialysis.    Final Clinical Impressions(s) / ED Diagnoses   Final diagnoses:  None    ED Discharge Orders    None       Quintella Reichert, MD 01/28/18 0006

## 2018-01-27 NOTE — ED Triage Notes (Signed)
Pt in c/o cough and congestion for the last few months, worse at night, went to his pulmonologist on Friday and they did not do anything because his xray was negative

## 2018-01-27 NOTE — ED Notes (Signed)
Pt walked around nursing station. Oxygen saturation dropped to 88% on RA. After returning to room pt o2 back up to 94% after 2 min rest.

## 2018-01-27 NOTE — Assessment & Plan Note (Addendum)
Patient Instructions  Chest x ray showed fluid overload Patient is on dialysis - will need more fluid taken off Please follow up with dialysis and cardiology mucinex dm up to 1200 mg every 12hours as needed and  Take tramadol 50 mg every 4 hours if needed  Prilosec should be Take 30- 60 min before your first and last meals of the day  Follow up with Dr. Melvyn Novas at his 1st available appointment  Cough: Continue gastroesophageal reflux disease treatment with elevating the head your bed and taking antacids Continue taking over-the-counter antihistamines and nasal fluticasone to help with allergic rhinitis You need to try to suppress your cough to allow your larynx (voice box) to heal.  For three days don't talk, laugh, sing, or clear your throat. Do everything you can to suppress the cough during this time. Use hard candies (sugarless Jolly Ranchers) or non-mint or non-menthol containing cough drops during this time to soothe your throat.  Use a cough suppressant (Delsym or what I have prescribed you) around the clock during this time.  After three days, gradually increase the use of your voice and back off on the cough suppressants.  Stop smoking!

## 2018-01-27 NOTE — H&P (Signed)
History and Physical    Kirk [name] WNI:[id number] DOB: 09-06-1960 DOA: 01/27/2018  Referring MD/NP/PA:   PCP: Gayland Curry, DO   Patient coming from:  The patient is coming from home.  At baseline, pt is independent for most of ADL.        Chief Complaint: SOB, chest pain, rectal bleeding  HPI: Kirk [name] is a 58 y.o. 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  He has cough with little yellow or greenish colored sputum production.  He also reports chills, no fever.  He reports intermittent chest pain, which is located in the front chest, sometimes 10 out of 10 severity, currently chest pain-free, no tenderness in the calf areas.  Patient cannot characterize further if his chest pain is pleuritic or exertional.  He reports headache in the frontal head.  No unilateral weakness, numbness or tingling his extremities.  No facial droop or slurred speech.  Patient states that he has intermittent rectal bleeding with bright red-colored blood recently. He was seen by his GI, Dr. Ardis Hughs in 12/2017 and was told to take fiber supplements. He has nausea and vomiting sometimes, currently no abdominal pain or diarrhea.  He denies symptoms of UTI. Pt states that he was seen by his pulmonologist on Friday, and had a negative chest x-ray.  ED Course: pt was found to have positive troponin 0.03, positive d-dimer at 0.76, potassium 3.3, bicarbonate 29, creatinine 8.45, BUN 18, hemoglobin 7.8 (11.7 on 06/28/2017, 9.0 on 06/05/2017), BMP 51.2, temperature normal, no tachycardia, oxygen saturation 88-->100% on RA, chest x-ray with cardiomegaly and vascular congestion.  Patient is placed  on telemetry bed of observation.  Review of Systems:   General: no fevers, chills, no body weight gain, has fatigue HEENT: no blurry vision, hearing changes or sore throat Respiratory: has dyspnea, coughing, no wheezing CV: has chest pain, no palpitations GI: has  nausea, vomiting and rectal bleeding, No abdominal pain, diarrhea, constipation GU: no dysuria, burning on urination, increased urinary frequency, hematuria  Ext: no leg edema Neuro: no unilateral weakness, numbness, or tingling, no vision change or hearing loss Skin: no rash, no skin tear. MSK: No muscle spasm, no deformity, no limitation of range of movement in spin Heme: No easy bruising.  Travel history: No recent long distant travel.  Allergy:  Allergies  Allergen Reactions  . Codeine Nausea And Vomiting    Past Medical History:  Diagnosis Date  . Acute edema of lung, unspecified   . Acute, but ill-defined, cerebrovascular disease   . Allergy   . Anemia   . 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)   . Anxiety   . Asthma   . Carpal tunnel syndrome   . Cellulitis and abscess of trunk   . Cholelithiasis 07/13/2014  . Chronic headaches   . Debility, unspecified   . Dermatophytosis of the body   . Dysrhythmia    history of  . Edema   . End stage renal disease on dialysis [facility])    "MWF; [facility]" (10/21/2014)  . Essential hypertension, benign   . GERD (gastroesophageal reflux disease)   . Gout, unspecified   . HTN (hypertension)   . Hypertrophy of prostate without urinary obstruction  and other lower urinary tract symptoms (LUTS)   . Hypotension, unspecified   . Impotence of organic origin   . Insomnia, unspecified   . Kidney replaced by transplant   . Localization-related (focal) (partial) epilepsy and epileptic syndromes with complex partial seizures, without mention of intractable epilepsy   . Lumbago   . Memory loss   . OSA on CPAP   . Other and unspecified hyperlipidemia   .  Other chronic nonalcoholic liver disease   . Other malaise and fatigue   . Other nonspecific abnormal serum enzyme levels   . Pain in joint, lower leg   . Pain in joint, upper arm   . Pneumonia "several times"  . Renal dialysis status(V45.11) 02/05/2010   restarted 01/02/13 ofter renal trransplant failure  . Secondary hyperparathyroidism (of renal origin)   . Shortness of breath   . Sleep apnea   . Tension headache   . Unspecified constipation   . Unspecified essential hypertension   . Unspecified hereditary and idiopathic peripheral neuropathy   . Unspecified vitamin D deficiency     Past Surgical History:  Procedure Laterality Date  . AV FISTULA PLACEMENT Left ?2010   "forearm; at [facility]"  . BACK SURGERY    . CARDIAC CATHETERIZATION  03/21/2011  . CHOLECYSTECTOMY N/A 10/21/2014   Procedure: LAPAROSCOPIC CHOLECYSTECTOMY WITH INTRAOPERATIVE CHOLANGIOGRAM;  Surgeon: Autumn Messing III, MD;  Location: [place];  Service: General;  Laterality: N/A;  . COLONOSCOPY    . 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 KIDNEY TRANSPLANT  08/17/2011   [place]  10/21/2014   w/IOC  . LEFT HEART CATHETERIZATION WITH CORONARY ANGIOGRAM N/A 03/21/2011   Procedure: LEFT HEART CATHETERIZATION WITH CORONARY ANGIOGRAM;  Surgeon: [name], MD;  Location: [facility] CATH LAB;  Service: Cardiovascular;  Laterality: N/A;  . NEPHRECTOMY  08/2013   removed transplaned kidney  . POSTERIOR FUSION CERVICAL SPINE  06/25/2012   for spinal stenosis  . VASECTOMY  2010    Social History:  reports that he has been smoking e-cigarettes. He has a 16.00 pack-year smoking history. He has never used smokeless tobacco. He reports that he does not drink alcohol or use drugs.  Family History:  Family History  Adopted: Yes  Problem Relation Age of Onset  . Colon cancer Neg Hx   . Esophageal cancer Neg Hx   . Rectal cancer Neg Hx   . Stomach cancer Neg Hx       Prior to Admission medications   Medication Sig Start Date End Date Taking? Authorizing Provider  acetaminophen (TYLENOL) 500 MG tablet Take 1 tablet (500 mg total) by mouth every 6 (six) hours as needed for moderate pain. 09/12/14  Yes Samuella Cota, MD  albuterol (PROVENTIL HFA;VENTOLIN HFA) 108 (90 Base) MCG/ACT inhaler Inhale 2 puffs into the lungs every 6 (six) hours as needed for wheezing or shortness of breath. 06/05/17  Yes Patrecia Pour, MD  aspirin EC 81 MG tablet Take 81 mg by mouth daily.   Yes [provider]  b complex-vitamin c-folic acid (NEPHRO-VITE) 0.8 MG TABS tablet TAKE 1 TABLET BY MOUTH EVERY DAY Patient taking differently: Take 1 tablet by mouth daily.  09/19/17  Yes Reed, Tiffany L, DO  budesonide-formoterol (SYMBICORT) 160-4.5 MCG/ACT inhaler Take 2 puffs first thing in am and then another 2 puffs about 12 hours later. 10/02/17  Yes Tanda Rockers, MD  calcitRIOL (ROCALTROL) 0.25 MCG  capsule Take 1 capsule (0.25 mcg total) by mouth every Monday, Wednesday, and Friday with hemodialysis. 09/21/14  Yes Bonnielee Haff, MD  Cholecalciferol (VITAMIN D3) 5000 UNITS TABS Take 1 tablet by mouth daily.   Yes [provider]  clonazePAM (KLONOPIN) 0.5 MG tablet TAKE 1 TABLET (0.5 MG TOTAL) BY MOUTH DAILY AS NEEDED FOR ANXIETY. 10/23/17  Yes Reed, Tiffany L, DO  diphenhydrAMINE (BENADRYL) 25 MG tablet Take 25 mg by mouth at bedtime as needed for allergies.   Yes [provider]  guaiFENesin-dextromethorphan (ROBITUSSIN DM) 100-10 MG/5ML syrup Take 10 mLs by mouth every 4 (four) hours as needed for cough.   Yes [provider]  hydrALAZINE (APRESOLINE) 50 MG tablet Take 100 mg by mouth daily.   Yes [provider]  metoprolol tartrate (LOPRESSOR) 50 MG tablet Take 1.5 tablets (75 mg total) by mouth 2 (two) times daily. Take One and a half tablet by mouth twice daily 10/25/17  Yes Reed, Tiffany L, DO  omeprazole (PRILOSEC) 20 MG capsule TAKE  1 CAPSULE BY MOUTH 2 TIMES DAILY BEFORE A MEAL. Patient taking differently: Take 20 mg by mouth 2 (two) times daily before a meal.  12/26/16  Yes Milus Banister, MD  ondansetron (ZOFRAN ODT) 4 MG disintegrating tablet Take 1 tablet (4 mg total) by mouth every 8 (eight) hours as needed for nausea or vomiting. 10/26/16  Yes Carmon Sails J, PA-C  pravastatin (PRAVACHOL) 40 MG tablet TAKE 1 TABLET EVERY EVENING Patient taking differently: Take 40 mg by mouth daily.  10/25/17  Yes Reed, Tiffany L, DO  Probiotic Product (PROBIOTIC-10 PO) Take 1 capsule by mouth daily.    Yes [provider]  sucroferric oxyhydroxide (VELPHORO) 500 MG chewable tablet Chew 500-1,500 mg by mouth See admin instructions. Take 1500 mg with meals and 500 mg with snacks   Yes [provider]  traMADol (ULTRAM) 50 MG tablet One every 4 hours as needed for cough Patient taking differently: Take 50 mg by mouth every 6 (six) hours as needed for moderate pain (cough).  12/30/17  Yes Tanda Rockers, MD  UNABLE TO FIND CPAP: During all periods of rest (naps and at bedtime)   Yes [provider]    Physical Exam: Vitals:   01/28/18 0100 01/28/18 0220 01/28/18 0300 01/28/18 0400  BP: (!) 178/70 (!) 142/92 (!) 160/63 (!) 153/56  Pulse:  97 (!) 103 99  Resp: (!) 23 16 (!) 26   Temp:      TempSrc:      SpO2:  97% 90% 96%   General: Not in acute distress HEENT:       Eyes: PERRL, EOMI, no scleral icterus.       ENT: No discharge from the ears and nose, no pharynx injection, no tonsillar enlargement.        Neck: No JVD, no bruit, no mass felt. Heme: No neck lymph node enlargement. Cardiac: S1/S2, RRR, No murmurs, No gallops or rubs. Respiratory:  Has fine crackles bilaterally. GI: Soft, nondistended, nontender, no rebound pain, no organomegaly, BS present. GU: No hematuria Ext: No pitting leg edema bilaterally. 2+DP/PT pulse bilaterally. Musculoskeletal: No joint deformities, No joint redness or  warmth, no limitation of ROM in spin. Skin: No rashes.  Neuro: Alert, oriented X3, cranial nerves II-XII grossly intact, moves all extremities normally.  Psych: Patient is not psychotic, no suicidal or hemocidal ideation.  Labs on Admission: I have personally reviewed following labs and imaging studies  CBC: Recent  Labs  Lab 01/27/18 2100 01/28/18 0127  WBC 3.7* 4.0  NEUTROABS 2.6  --   HGB 7.8* 8.1*  HCT 24.9* 25.8*  MCV 101.2* 102.0*  PLT 97* 93*   Basic Metabolic Panel: Recent Labs  Lab 01/27/18 2100  NA 139  K 3.3*  CL 99  CO2 29  GLUCOSE 146*  BUN 18  CREATININE 8.45*  CALCIUM 8.7*   GFR: Estimated Creatinine Clearance: 10.8 mL/min (A) (by C-G formula based on SCr of 8.45 mg/dL (H)). Liver Function Tests: No results for input(s): AST, ALT, ALKPHOS, BILITOT, PROT, ALBUMIN in the last 168 hours. No results for input(s): LIPASE, AMYLASE in the last 168 hours. No results for input(s): AMMONIA in the last 168 hours. Coagulation Profile: Recent Labs  Lab 01/28/18 0127  INR 1.12   Cardiac Enzymes: Recent Labs  Lab 01/28/18 0127  TROPONINI 0.03*   BNP (last 3 results) No results for input(s): PROBNP in the last 8760 hours. HbA1C: Recent Labs    01/28/18 0242  HGBA1C 5.1   CBG: No results for input(s): GLUCAP in the last 168 hours. Lipid Profile: Recent Labs    01/28/18 0242  CHOL 106  HDL 23*  LDLCALC 51  TRIG 160*  CHOLHDL 4.6   Thyroid Function Tests: Recent Labs    01/28/18 0242  TSH 0.787   Anemia Panel: Recent Labs    01/28/18 0127  VITAMINB12 296  FOLATE 30.8  FERRITIN 755*  TIBC 197*  IRON 31*  RETICCTPCT 1.5   Urine analysis:    Component Value Date/Time   COLORURINE YELLOW 02/19/2017 1520   APPEARANCEUR HAZY (A) 02/19/2017 1520   LABSPEC 1.012 02/19/2017 1520   PHURINE 8.0 02/19/2017 1520   GLUCOSEU 50 (A) 02/19/2017 1520   HGBUR MODERATE (A) 02/19/2017 1520   BILIRUBINUR NEGATIVE 02/19/2017 [place] 02/19/2017 1520   PROTEINUR >=300 (A) 02/19/2017 1520   UROBILINOGEN 0.2 04/06/2014 1030   NITRITE NEGATIVE 02/19/2017 1520   LEUKOCYTESUR MODERATE (A) 02/19/2017 1520   Sepsis Labs: @LABRCNTIP (procalcitonin:4,lacticidven:4) )No results found for this or any previous visit (from the past 240 hour(s)).   Radiological Exams on Admission: Dg Chest 2 View  Result Date: 01/27/2018 CLINICAL DATA:  Productive cough.  Shortness of breath. EXAM: CHEST - 2 VIEW COMPARISON:  Radiographs of January 24, 2018. FINDINGS: Stable cardiomegaly with central pulmonary vascular congestion. No pneumothorax or pleural effusion is noted. No consolidative process is noted. Bony thorax is unremarkable. IMPRESSION: Stable cardiomegaly with central pulmonary vascular congestion. Electronically Signed   By: Marijo Conception, M.D.   On: 01/27/2018 21:39   Ct Angio Chest Pe W Or Wo Contrast  Result Date: 01/28/2018 CLINICAL DATA:  PE suspected, intermediate probability, positive D-dimer. EXAM: CT ANGIOGRAPHY CHEST WITH CONTRAST TECHNIQUE: Multidetector CT imaging of the chest was performed using the standard protocol during bolus administration of intravenous contrast. Multiplanar CT image reconstructions and MIPs were obtained to evaluate the vascular anatomy. CONTRAST:  45mL ISOVUE-370 IOPAMIDOL (ISOVUE-370) INJECTION 76% COMPARISON:  05/30/2017 FINDINGS: Cardiovascular: Satisfactory opacification of the pulmonary arteries to the segmental level. No evidence of pulmonary embolism. Borderline heart size. No pericardial effusion. Aortic and coronary atherosclerosis. Mediastinum/Nodes: Negative for adenopathy or mass. Lungs/Pleura: Generalized airway thickening. Clustered micro nodules in the lower lungs. Mosaic attenuation in the lower lungs from air trapping. No edema, effusion, or pneumothorax Upper Abdomen: Negative Musculoskeletal: Spondylosis.  No acute finding Review of the MIP images confirms the above findings.  IMPRESSION: 1. Bronchitis with  small airway impaction versus infectious bronchiolitis in the lower lobes. 2. Negative for pulmonary embolism. Electronically Signed   By: Monte Fantasia M.D.   On: 01/28/2018 05:20     EKG: Independently reviewed.  Sinus rhythm, QTC 527, LAE, early R wave progression, nonspecific T wave change. Assessment/Plan Principal Problem:   Acute respiratory failure with hypoxia (HCC) Active Problems:   GIB (gastrointestinal bleeding)   OSA on CPAP   HTN (hypertension)   Anemia in chronic renal disease   Pancytopenia (HCC)   ESRD on dialysis (HCC)   Chest pain   Hypokalemia   Cigarette smoker   Elevated troponin   Acute respiratory failure with hypoxia [facility]): D-dimer is +0.76, but CT angiogram is negative for PE.  CTA showed bronchitis with small airway impaction versus infectious bronchiolitis in the lower lobes.  -will place on tele bed for obs -Nebulizers: scheduled Duoneb and prn albuterol -dulera inhaler -Z pak -Follow up sputum culture. -Nasal cannula oxygen as needed to maintain O2 saturation 92% or greater  Chest pain and elevated trop: Patient has atypical chest pain.  Etiology is not clear.  CT angiogram is negative for PE.  May be due to demand ischemia secondary to worsening anemia, hypertensive urgency and bronchitis. Trop is minimally elevated at 0.03 in the setting of ESRD.  - cycle CE q6 x3 and repeat EKG in the am  - prn Nitroglycerin, Morphine, and pravastatin - no ASA due GIB - Risk factor stratification: will check FLP and A1C, UDS -f/u [place] doppper to r/o DVT due to positive D-dimer  OSA - on CPAP  HTN and hypertensive urgency: Initial blood pressure 200 backslash 83 -Continue home medications: Hydralazine, metoprolol -IV hydralazine prn  ESRD on dialysis (MWF): had dialysis on Monday. -Please call renal for dialysis   Hypokalemia: K=3.3 -will not replete due to ESRD -f/u by BMP  Cigarette smoker: E-cigarette  smoking -Nicotine patch  GIB, anemia in chronic renal disease, and pancytopenia: pt was seen by GI, Dr. Ardis Hughs on 01/14/18. Per his clinic note, "He has chronic renal failure and that certainly contributes to his anemia.  He has minor intermittent rectal bleeding.  His last colonoscopy was a year and a half ago and he is due for another 1 in 2021 for personal history of precancerous polyps.  I do not think that needs to be repeated any sooner than that since his bleeding really seems benign, anorectal in origin, likely from internal hemorrhoids.  I suggested a trial of fiber supplements to see if we can keep him from pushing and straining.  He will call to report on his response in 2 or 3 months".  His Hemoglobin is 7.8 (11.7 on 06/28/2017, 9.0 on 06/05/2017), seems slowly losing blood.  -start Protonix 40 mg bid -check INR/PTT/type screen -1U of blood was ordered by EDP -cbc q6h -check FOBT -Check anemia panel     DVT ppx: SCD Code Status: Full code Family Communication:  Yes, patient's wife at bed side Disposition Plan:  Anticipate discharge back to previous home environment Consults called:  none Admission status: Obs / tele   Date of Service 01/28/2018    Ivor Costa [facility] Pager [phone]  If 7PM-7AM, please contact night-coverage [email or web address] Password [facility] 01/28/2018, 7:31 AM

## 2018-01-27 NOTE — Assessment & Plan Note (Signed)
Chest x-ray in office today showed mild volume overload versus early edema pattern.  Patient will need more fluid taken off at next dialysis.   Patient Instructions  Chest x ray showed fluid overload Patient is on dialysis - will need more fluid taken off Please follow up with dialysis and cardiology mucinex dm up to 1200 mg every 12hours as needed and  Take tramadol 50 mg every 4 hours if needed  Prilosec should be Take 30- 60 min before your first and last meals of the day  Follow up with Dr. Melvyn Novas at his 1st available appointment

## 2018-01-28 ENCOUNTER — Ambulatory Visit (HOSPITAL_COMMUNITY): Payer: Medicare Other

## 2018-01-28 ENCOUNTER — Observation Stay (HOSPITAL_COMMUNITY): Payer: Medicare Other

## 2018-01-28 DIAGNOSIS — F17290 Nicotine dependence, other tobacco product, uncomplicated: Secondary | ICD-10-CM | POA: Diagnosis present

## 2018-01-28 DIAGNOSIS — I130 Hypertensive heart and chronic kidney disease with heart failure and stage 1 through stage 4 chronic kidney disease, or unspecified chronic kidney disease: Secondary | ICD-10-CM | POA: Diagnosis not present

## 2018-01-28 DIAGNOSIS — I120 Hypertensive chronic kidney disease with stage 5 chronic kidney disease or end stage renal disease: Secondary | ICD-10-CM | POA: Diagnosis not present

## 2018-01-28 DIAGNOSIS — I5033 Acute on chronic diastolic (congestive) heart failure: Secondary | ICD-10-CM | POA: Diagnosis present

## 2018-01-28 DIAGNOSIS — T8612 Kidney transplant failure: Secondary | ICD-10-CM | POA: Diagnosis present

## 2018-01-28 DIAGNOSIS — F17210 Nicotine dependence, cigarettes, uncomplicated: Secondary | ICD-10-CM | POA: Diagnosis not present

## 2018-01-28 DIAGNOSIS — Z7951 Long term (current) use of inhaled steroids: Secondary | ICD-10-CM | POA: Diagnosis not present

## 2018-01-28 DIAGNOSIS — N186 End stage renal disease: Secondary | ICD-10-CM | POA: Diagnosis present

## 2018-01-28 DIAGNOSIS — I10 Essential (primary) hypertension: Secondary | ICD-10-CM | POA: Diagnosis not present

## 2018-01-28 DIAGNOSIS — D61818 Other pancytopenia: Secondary | ICD-10-CM | POA: Diagnosis present

## 2018-01-28 DIAGNOSIS — Z905 Acquired absence of kidney: Secondary | ICD-10-CM | POA: Diagnosis not present

## 2018-01-28 DIAGNOSIS — J9601 Acute respiratory failure with hypoxia: Secondary | ICD-10-CM | POA: Diagnosis present

## 2018-01-28 DIAGNOSIS — E876 Hypokalemia: Secondary | ICD-10-CM | POA: Diagnosis present

## 2018-01-28 DIAGNOSIS — Z885 Allergy status to narcotic agent status: Secondary | ICD-10-CM | POA: Diagnosis not present

## 2018-01-28 DIAGNOSIS — R7989 Other specified abnormal findings of blood chemistry: Secondary | ICD-10-CM

## 2018-01-28 DIAGNOSIS — R778 Other specified abnormalities of plasma proteins: Secondary | ICD-10-CM | POA: Diagnosis present

## 2018-01-28 DIAGNOSIS — Z992 Dependence on renal dialysis: Secondary | ICD-10-CM

## 2018-01-28 DIAGNOSIS — Z9989 Dependence on other enabling machines and devices: Secondary | ICD-10-CM | POA: Diagnosis not present

## 2018-01-28 DIAGNOSIS — K219 Gastro-esophageal reflux disease without esophagitis: Secondary | ICD-10-CM | POA: Diagnosis present

## 2018-01-28 DIAGNOSIS — I208 Other forms of angina pectoris: Secondary | ICD-10-CM | POA: Diagnosis not present

## 2018-01-28 DIAGNOSIS — E785 Hyperlipidemia, unspecified: Secondary | ICD-10-CM | POA: Diagnosis present

## 2018-01-28 DIAGNOSIS — R079 Chest pain, unspecified: Secondary | ICD-10-CM

## 2018-01-28 DIAGNOSIS — K769 Liver disease, unspecified: Secondary | ICD-10-CM | POA: Diagnosis present

## 2018-01-28 DIAGNOSIS — G4733 Obstructive sleep apnea (adult) (pediatric): Secondary | ICD-10-CM | POA: Diagnosis not present

## 2018-01-28 DIAGNOSIS — N2581 Secondary hyperparathyroidism of renal origin: Secondary | ICD-10-CM | POA: Diagnosis not present

## 2018-01-28 DIAGNOSIS — Z79899 Other long term (current) drug therapy: Secondary | ICD-10-CM | POA: Diagnosis not present

## 2018-01-28 DIAGNOSIS — J45901 Unspecified asthma with (acute) exacerbation: Secondary | ICD-10-CM | POA: Diagnosis present

## 2018-01-28 DIAGNOSIS — I132 Hypertensive heart and chronic kidney disease with heart failure and with stage 5 chronic kidney disease, or end stage renal disease: Secondary | ICD-10-CM | POA: Diagnosis present

## 2018-01-28 DIAGNOSIS — D631 Anemia in chronic kidney disease: Secondary | ICD-10-CM | POA: Diagnosis not present

## 2018-01-28 DIAGNOSIS — R0602 Shortness of breath: Secondary | ICD-10-CM | POA: Diagnosis not present

## 2018-01-28 DIAGNOSIS — K921 Melena: Secondary | ICD-10-CM | POA: Diagnosis present

## 2018-01-28 DIAGNOSIS — Z7982 Long term (current) use of aspirin: Secondary | ICD-10-CM | POA: Diagnosis not present

## 2018-01-28 DIAGNOSIS — Y830 Surgical operation with transplant of whole organ as the cause of abnormal reaction of the patient, or of later complication, without mention of misadventure at the time of the procedure: Secondary | ICD-10-CM | POA: Diagnosis present

## 2018-01-28 LAB — I-STAT VENOUS BLOOD GAS, ED
ACID-BASE DEFICIT: 2 mmol/L (ref 0.0–2.0)
Bicarbonate: 23.2 mmol/L (ref 20.0–28.0)
O2 Saturation: 89 %
TCO2: 24 mmol/L (ref 22–32)
pCO2, Ven: 40.6 mmHg — ABNORMAL LOW (ref 44.0–60.0)
pH, Ven: 7.365 (ref 7.250–7.430)
pO2, Ven: 59 mmHg — ABNORMAL HIGH (ref 32.0–45.0)

## 2018-01-28 LAB — EXPECTORATED SPUTUM ASSESSMENT W GRAM STAIN, RFLX TO RESP C

## 2018-01-28 LAB — LIPID PANEL
Cholesterol: 106 mg/dL (ref 0–200)
HDL: 23 mg/dL — ABNORMAL LOW (ref >40)
LDL Cholesterol: 51 mg/dL (ref 0–99)
Total CHOL/HDL Ratio: 4.6 RATIO
Triglycerides: 160 mg/dL — ABNORMAL HIGH (ref <150)
VLDL: 32 mg/dL (ref 0–40)

## 2018-01-28 LAB — CBC
HCT: 25.8 % — ABNORMAL LOW (ref 39.0–52.0)
HCT: 25.2 % — ABNORMAL LOW (ref 39.0–52.0)
Hemoglobin: 8.1 g/dL — ABNORMAL LOW (ref 13.0–17.0)
Hemoglobin: 8 g/dL — ABNORMAL LOW (ref 13.0–17.0)
MCH: 32 pg (ref 26.0–34.0)
MCH: 32.7 pg (ref 26.0–34.0)
MCHC: 31.4 g/dL (ref 30.0–36.0)
MCHC: 31.7 g/dL (ref 30.0–36.0)
MCV: 102 fL — ABNORMAL HIGH (ref 80.0–100.0)
MCV: 102.9 fL — ABNORMAL HIGH (ref 80.0–100.0)
Platelets: 93 10*3/uL — ABNORMAL LOW (ref 150–400)
Platelets: 98 10*3/uL — ABNORMAL LOW (ref 150–400)
RBC: 2.53 MIL/uL — ABNORMAL LOW (ref 4.22–5.81)
RBC: 2.45 MIL/uL — ABNORMAL LOW (ref 4.22–5.81)
RDW: 14.4 % (ref 11.5–15.5)
RDW: 14.4 % (ref 11.5–15.5)
WBC: 4 10*3/uL (ref 4.0–10.5)
WBC: 3.5 10*3/uL — ABNORMAL LOW (ref 4.0–10.5)
nRBC: 0 % (ref 0.0–0.2)
nRBC: 0 % (ref 0.0–0.2)

## 2018-01-28 LAB — BASIC METABOLIC PANEL
Anion gap: 13 (ref 5–15)
BUN: 23 mg/dL — ABNORMAL HIGH (ref 6–20)
CHLORIDE: 98 mmol/L (ref 98–111)
CO2: 28 mmol/L (ref 22–32)
Calcium: 8.9 mg/dL (ref 8.9–10.3)
Creatinine, Ser: 10.07 mg/dL — ABNORMAL HIGH (ref 0.61–1.24)
GFR calc Af Amer: 6 mL/min — ABNORMAL LOW (ref >60)
GFR calc non Af Amer: 5 mL/min — ABNORMAL LOW (ref >60)
Glucose, Bld: 101 mg/dL — ABNORMAL HIGH (ref 70–99)
POTASSIUM: 3.8 mmol/L (ref 3.5–5.1)
Sodium: 139 mmol/L (ref 135–145)

## 2018-01-28 LAB — FOLATE: FOLATE: 30.8 ng/mL (ref >5.9)

## 2018-01-28 LAB — RAPID URINE DRUG SCREEN, HOSP PERFORMED
Amphetamines: NOT DETECTED
BENZODIAZEPINES: NOT DETECTED
Barbiturates: NOT DETECTED
Cocaine: NOT DETECTED
Opiates: NOT DETECTED
Tetrahydrocannabinol: NOT DETECTED

## 2018-01-28 LAB — PROTIME-INR
INR: 1.12
Prothrombin Time: 14.3 seconds (ref 11.4–15.2)

## 2018-01-28 LAB — D-DIMER, QUANTITATIVE: D-Dimer, Quant: 0.76 ug/mL-FEU — ABNORMAL HIGH (ref 0.00–0.50)

## 2018-01-28 LAB — TROPONIN I
TROPONIN I: 0.09 ng/mL — AB (ref <0.03)
Troponin I: 0.03 ng/mL (ref <0.03)

## 2018-01-28 LAB — HEMOGLOBIN A1C
Hgb A1c MFr Bld: 5.1 % (ref 4.8–5.6)
Mean Plasma Glucose: 99.67 mg/dL

## 2018-01-28 LAB — VITAMIN B12: Vitamin B-12: 296 pg/mL (ref 180–914)

## 2018-01-28 LAB — PREPARE RBC (CROSSMATCH)

## 2018-01-28 LAB — IRON AND TIBC
Iron: 31 ug/dL — ABNORMAL LOW (ref 45–182)
Saturation Ratios: 16 % — ABNORMAL LOW (ref 17.9–39.5)
TIBC: 197 ug/dL — ABNORMAL LOW (ref 250–450)
UIBC: 166 ug/dL

## 2018-01-28 LAB — FERRITIN: Ferritin: 755 ng/mL — ABNORMAL HIGH (ref 24–336)

## 2018-01-28 LAB — RETICULOCYTES
Immature Retic Fract: 4.9 % (ref 2.3–15.9)
RBC.: 2.53 MIL/uL — ABNORMAL LOW (ref 4.22–5.81)
RETIC CT PCT: 1.5 % (ref 0.4–3.1)
Retic Count, Absolute: 38.2 10*3/uL (ref 19.0–186.0)

## 2018-01-28 LAB — INFLUENZA PANEL BY PCR (TYPE A & B)
Influenza A By PCR: NEGATIVE
Influenza B By PCR: NEGATIVE

## 2018-01-28 LAB — TSH: TSH: 0.787 u[IU]/mL (ref 0.350–4.500)

## 2018-01-28 LAB — APTT: aPTT: 37 seconds — ABNORMAL HIGH (ref 24–36)

## 2018-01-28 LAB — EXPECTORATED SPUTUM ASSESSMENT W REFEX TO RESP CULTURE

## 2018-01-28 MED ORDER — MOMETASONE FURO-FORMOTEROL FUM 200-5 MCG/ACT IN AERO
2.0000 | INHALATION_SPRAY | Freq: Two times a day (BID) | RESPIRATORY_TRACT | Status: DC
  Administered 2018-01-28 – 2018-01-30 (×4): 2 via RESPIRATORY_TRACT
  Filled 2018-01-28 (×2): qty 8.8

## 2018-01-28 MED ORDER — DIPHENHYDRAMINE HCL 25 MG PO CAPS: 25.0000 mg | ORAL_CAPSULE | Freq: Every evening | ORAL | Status: DC | PRN

## 2018-01-28 MED ORDER — SUCROFERRIC OXYHYDROXIDE 500 MG PO CHEW
500.0000 mg | CHEWABLE_TABLET | ORAL | Status: DC | PRN
  Filled 2018-01-28: qty 1

## 2018-01-28 MED ORDER — SUCROFERRIC OXYHYDROXIDE 500 MG PO CHEW
1500.0000 mg | CHEWABLE_TABLET | Freq: Three times a day (TID) | ORAL | Status: DC
  Administered 2018-01-29 – 2018-01-30 (×4): 1500 mg via ORAL
  Filled 2018-01-28 (×9): qty 3

## 2018-01-28 MED ORDER — IPRATROPIUM-ALBUTEROL 0.5-2.5 (3) MG/3ML IN SOLN
3.0000 mL | RESPIRATORY_TRACT | Status: DC
  Administered 2018-01-28: 3 mL via RESPIRATORY_TRACT
  Filled 2018-01-28: qty 3

## 2018-01-28 MED ORDER — GUAIFENESIN-DM 100-10 MG/5ML PO SYRP: 10.0000 mL | ORAL_SOLUTION | ORAL | Status: DC | PRN

## 2018-01-28 MED ORDER — MOMETASONE FURO-FORMOTEROL FUM 200-5 MCG/ACT IN AERO: 2.0000 | INHALATION_SPRAY | Freq: Two times a day (BID) | RESPIRATORY_TRACT | Status: DC

## 2018-01-28 MED ORDER — METOPROLOL TARTRATE 5 MG/5ML IV SOLN
INTRAVENOUS | Status: AC
  Filled 2018-01-28: qty 5

## 2018-01-28 MED ORDER — TRAMADOL HCL 50 MG PO TABS: 50.0000 mg | ORAL_TABLET | Freq: Four times a day (QID) | ORAL | Status: DC | PRN

## 2018-01-28 MED ORDER — VITAMIN D 25 MCG (1000 UNIT) PO TABS
5000.0000 [IU] | ORAL_TABLET | Freq: Every day | ORAL | Status: DC
  Administered 2018-01-28 – 2018-01-30 (×3): 5000 [IU] via ORAL
  Filled 2018-01-28 (×3): qty 5

## 2018-01-28 MED ORDER — AZITHROMYCIN 250 MG PO TABS
500.0000 mg | ORAL_TABLET | Freq: Every day | ORAL | Status: AC
  Administered 2018-01-28: 500 mg via ORAL
  Filled 2018-01-28: qty 2

## 2018-01-28 MED ORDER — PRAVASTATIN SODIUM 40 MG PO TABS
40.0000 mg | ORAL_TABLET | Freq: Every day | ORAL | Status: DC
  Administered 2018-01-28 – 2018-01-30 (×3): 40 mg via ORAL
  Filled 2018-01-28 (×3): qty 1

## 2018-01-28 MED ORDER — IOPAMIDOL (ISOVUE-370) INJECTION 76%
75.0000 mL | Freq: Once | INTRAVENOUS | Status: AC | PRN
  Administered 2018-01-28: 75 mL via INTRAVENOUS

## 2018-01-28 MED ORDER — LEVALBUTEROL HCL 0.63 MG/3ML IN NEBU: 0.6300 mg | INHALATION_SOLUTION | Freq: Four times a day (QID) | RESPIRATORY_TRACT | Status: DC | PRN

## 2018-01-28 MED ORDER — NICOTINE 21 MG/24HR TD PT24
21.0000 mg | MEDICATED_PATCH | Freq: Every day | TRANSDERMAL | Status: DC
  Filled 2018-01-28 (×2): qty 1

## 2018-01-28 MED ORDER — HYDRALAZINE HCL 50 MG PO TABS
100.0000 mg | ORAL_TABLET | Freq: Every day | ORAL | Status: DC
  Administered 2018-01-28 – 2018-01-30 (×3): 100 mg via ORAL
  Filled 2018-01-28 (×3): qty 2

## 2018-01-28 MED ORDER — HEPARIN SODIUM (PORCINE) 1000 UNIT/ML DIALYSIS
4000.0000 [IU] | Freq: Once | INTRAMUSCULAR | Status: DC
  Filled 2018-01-28: qty 4

## 2018-01-28 MED ORDER — ACETAMINOPHEN 325 MG PO TABS: 650.0000 mg | ORAL_TABLET | Freq: Four times a day (QID) | ORAL | Status: DC | PRN

## 2018-01-28 MED ORDER — HYDROXYZINE HCL 10 MG PO TABS
10.0000 mg | ORAL_TABLET | Freq: Three times a day (TID) | ORAL | Status: DC | PRN
  Filled 2018-01-28: qty 1

## 2018-01-28 MED ORDER — CLONAZEPAM 0.5 MG PO TABS: 0.5000 mg | ORAL_TABLET | Freq: Two times a day (BID) | ORAL | Status: DC | PRN

## 2018-01-28 MED ORDER — PANTOPRAZOLE SODIUM 40 MG PO TBEC
40.0000 mg | DELAYED_RELEASE_TABLET | Freq: Two times a day (BID) | ORAL | Status: DC
  Administered 2018-01-28 – 2018-01-30 (×6): 40 mg via ORAL
  Filled 2018-01-28 (×6): qty 1

## 2018-01-28 MED ORDER — MORPHINE SULFATE (PF) 2 MG/ML IV SOLN: 2.0000 mg | INTRAVENOUS | Status: DC | PRN

## 2018-01-28 MED ORDER — LEVALBUTEROL HCL 1.25 MG/0.5ML IN NEBU
1.2500 mg | INHALATION_SOLUTION | Freq: Three times a day (TID) | RESPIRATORY_TRACT | Status: DC
  Administered 2018-01-28 – 2018-01-30 (×6): 1.25 mg via RESPIRATORY_TRACT
  Filled 2018-01-28 (×6): qty 0.5

## 2018-01-28 MED ORDER — IOPAMIDOL (ISOVUE-370) INJECTION 76%
INTRAVENOUS | Status: AC
  Filled 2018-01-28: qty 100

## 2018-01-28 MED ORDER — METOPROLOL TARTRATE 5 MG/5ML IV SOLN
5.0000 mg | Freq: Once | INTRAVENOUS | Status: AC
  Administered 2018-01-28: 5 mg via INTRAVENOUS

## 2018-01-28 MED ORDER — RISAQUAD PO CAPS
1.0000 | ORAL_CAPSULE | Freq: Every day | ORAL | Status: DC
  Administered 2018-01-28 – 2018-01-30 (×3): 1 via ORAL
  Filled 2018-01-28 (×3): qty 1

## 2018-01-28 MED ORDER — HEPARIN SODIUM (PORCINE) 1000 UNIT/ML IJ SOLN
INTRAMUSCULAR | Status: AC
  Filled 2018-01-28: qty 4

## 2018-01-28 MED ORDER — NITROGLYCERIN 0.4 MG SL SUBL: 0.4000 mg | SUBLINGUAL_TABLET | SUBLINGUAL | Status: DC | PRN

## 2018-01-28 MED ORDER — ALBUTEROL SULFATE (2.5 MG/3ML) 0.083% IN NEBU: 2.5000 mg | INHALATION_SOLUTION | RESPIRATORY_TRACT | Status: DC | PRN

## 2018-01-28 MED ORDER — ACETAMINOPHEN 650 MG RE SUPP: 650.0000 mg | Freq: Four times a day (QID) | RECTAL | Status: DC | PRN

## 2018-01-28 MED ORDER — RENA-VITE PO TABS
1.0000 | ORAL_TABLET | Freq: Every day | ORAL | Status: DC
  Administered 2018-01-28 – 2018-01-29 (×3): 1 via ORAL
  Filled 2018-01-28 (×3): qty 1

## 2018-01-28 MED ORDER — ZOLPIDEM TARTRATE 5 MG PO TABS: 5.0000 mg | ORAL_TABLET | Freq: Every evening | ORAL | Status: DC | PRN

## 2018-01-28 MED ORDER — CHLORHEXIDINE GLUCONATE CLOTH 2 % EX PADS
6.0000 | MEDICATED_PAD | Freq: Every day | CUTANEOUS | Status: DC
  Administered 2018-01-30: 6 via TOPICAL

## 2018-01-28 MED ORDER — PANTOPRAZOLE SODIUM 40 MG PO TBEC: 40.0000 mg | DELAYED_RELEASE_TABLET | Freq: Every day | ORAL | Status: DC

## 2018-01-28 MED ORDER — AZITHROMYCIN 250 MG PO TABS
250.0000 mg | ORAL_TABLET | Freq: Every day | ORAL | Status: DC
  Administered 2018-01-29 – 2018-01-30 (×2): 250 mg via ORAL
  Filled 2018-01-28 (×2): qty 1

## 2018-01-28 MED ORDER — HYDRALAZINE HCL 20 MG/ML IJ SOLN: 5.0000 mg | INTRAMUSCULAR | Status: DC | PRN

## 2018-01-28 MED ORDER — LEVALBUTEROL HCL 1.25 MG/0.5ML IN NEBU: 1.2500 mg | INHALATION_SOLUTION | Freq: Four times a day (QID) | RESPIRATORY_TRACT | Status: DC

## 2018-01-28 MED ORDER — METHYLPREDNISOLONE SODIUM SUCC 125 MG IJ SOLR
60.0000 mg | Freq: Three times a day (TID) | INTRAMUSCULAR | Status: DC
  Administered 2018-01-28 – 2018-01-30 (×5): 60 mg via INTRAVENOUS
  Filled 2018-01-28 (×5): qty 2

## 2018-01-28 MED ORDER — METOPROLOL TARTRATE 50 MG PO TABS
75.0000 mg | ORAL_TABLET | Freq: Two times a day (BID) | ORAL | Status: DC
  Administered 2018-01-28 – 2018-01-30 (×6): 75 mg via ORAL
  Filled 2018-01-28: qty 1
  Filled 2018-01-28: qty 3
  Filled 2018-01-28 (×3): qty 1
  Filled 2018-01-28: qty 3

## 2018-01-28 MED ORDER — CALCITRIOL 0.25 MCG PO CAPS: 0.2500 ug | ORAL_CAPSULE | ORAL | Status: DC

## 2018-01-28 NOTE — Progress Notes (Signed)
Patient ID: Kirk [name], male   DOB: 1960/06/01, 58 y.o.   MRN: [id number]  PROGRESS NOTE    Kirk [name]  IRS:[id number] DOB: 1960/06/11 DOA: 01/27/2018 PCP: Gayland Curry, DO   Brief Narrative:  58 year old male with history of hypertension, hyperlipidemia, asthma, GERD, gout, anxiety, anemia, ESRD on hemodialysis, OSA on CPAP, tobacco abuse, failed kidney transplantation who is not on immunosuppressant anymore presented with chest pain and shortness of breath and rectal bleeding.  Patient was recently seen by Dr. Jacobs/gastroenterology in 12/2017 and was told to take fiber supplements.  Patient was found to be hypoxic with chest x-ray showing cardiomegaly and vascular congestion.  Assessment & Plan:   Principal Problem:   Acute respiratory failure with hypoxia (HCC) Active Problems:   GIB (gastrointestinal bleeding)   OSA on CPAP   HTN (hypertension)   Anemia in chronic renal disease   Pancytopenia (HCC)   ESRD on dialysis (HCC)   Chest pain   Hypokalemia   Cigarette smoker   Elevated troponin  Probable COPD exacerbation causing hypoxia -CT angiogram was negative for PE but showed bronchitis with small airway impaction versus infectious bronchiolitis in the lower lobes -Continue Zithromax. -Currently wheezing.  Will start Solu-Medrol 60 mg IV every 8 hours.  Continue Dulera and duo nebs. -Currently on room air.  Oxygen supplementation if needed  Chest pain with slightly elevated troponin -Currently no chest pain.  No significant rise in troponin considering patient is an ESRD patient  -Cardiology evaluation.  Continue metoprolol and pravastatin and as needed nitroglycerin/morphine.  No aspirin due to recent GI bleeding  End-stage renal disease on hemodialysis -Had dialysis yesterday.  Nephrology consulted.  Hypokalemia -Resolved  Tobacco abuse -Counseled about cessation  Chronic anemia of renal disease/pancytopenia/probable hemorrhoidal bleeding -Hemoglobin  stable.  Monitor -pt was seen by GI, Dr. Ardis Hughs on 01/14/18. Per his clinic note, "He has chronic renal failure and that certainly contributes to his anemia. He has minor intermittent rectal bleeding. His last colonoscopy was a year and a half ago and he is due for another 1 in 2021 for personal history of precancerous polyps. I do not think that needs to be repeated any sooner than that since his bleeding really seems benign, anorectal in origin, likely from internal hemorrhoids. I suggested a trial of fiber supplements to see if we can keep him from pushing and straining. He will call to report on his response in 2 or 3 months".  -Outpatient follow-up with GI for the same.    DVT prophylaxis: SCDs Code Status: Full  family Communication: Wife at bedside Disposition Plan: Home in 1 to 2 days once cleared by cardiology and nephrology  Consultants: Nephrology/cardiology  Procedures: None   Antimicrobials: Zithromax from 01/27/2018 onwards   Subjective: Patient seen and examined at bedside.  He feels slightly better.  Still coughing with intermittent chest pains.  No overnight fever, nausea or vomiting.  Objective: Vitals:   01/28/18 0700 01/28/18 0721 01/28/18 0800 01/28/18 0932  BP: (!) 168/94 (!) 168/94 (!) 164/89 (!) 172/76  Pulse: 63 75 81 79  Resp:  18    Temp:      TempSrc:      SpO2: 98% 100% 100%    No intake or output data in the 24 hours ending 01/28/18 1228 There were no vitals filed for this visit.  Examination:  General exam: Appears calm and comfortable  Respiratory system: Bilateral decreased breath sounds at bases with scattered wheezing and some crackles Cardiovascular  system: S1 & S2 heard, Rate controlled Gastrointestinal system: Abdomen is nondistended, soft and nontender. Normal bowel sounds heard. Extremities: No cyanosis, clubbing; trace edema     Data Reviewed: I have personally reviewed following labs and imaging studies  CBC: Recent Labs    Lab February 02, 2018 2100 01/28/18 0127 01/28/18 0732  WBC 3.7* 4.0 3.5*  NEUTROABS 2.6  --   --   HGB 7.8* 8.1* 8.0*  HCT 24.9* 25.8* 25.2*  MCV 101.2* 102.0* 102.9*  PLT 97* 93* 98*   Basic Metabolic Panel: Recent Labs  Lab 02-02-18 2100 01/28/18 0618  NA 139 139  K 3.3* 3.8  CL 99 98  CO2 29 28  GLUCOSE 146* 101*  BUN 18 23*  CREATININE 8.45* 10.07*  CALCIUM 8.7* 8.9   GFR: Estimated Creatinine Clearance: 9.1 mL/min (A) (by C-G formula based on SCr of 10.07 mg/dL (H)). Liver Function Tests: No results for input(s): AST, ALT, ALKPHOS, BILITOT, PROT, ALBUMIN in the last 168 hours. No results for input(s): LIPASE, AMYLASE in the last 168 hours. No results for input(s): AMMONIA in the last 168 hours. Coagulation Profile: Recent Labs  Lab 01/28/18 0127  INR 1.12   Cardiac Enzymes: Recent Labs  Lab 01/28/18 0127 01/28/18 0618  TROPONINI 0.03* 0.09*   BNP (last 3 results) No results for input(s): PROBNP in the last 8760 hours. HbA1C: Recent Labs    01/28/18 0242  HGBA1C 5.1   CBG: No results for input(s): GLUCAP in the last 168 hours. Lipid Profile: Recent Labs    01/28/18 0242  CHOL 106  HDL 23*  LDLCALC 51  TRIG 160*  CHOLHDL 4.6   Thyroid Function Tests: Recent Labs    01/28/18 0242  TSH 0.787   Anemia Panel: Recent Labs    01/28/18 0127  VITAMINB12 296  FOLATE 30.8  FERRITIN 755*  TIBC 197*  IRON 31*  RETICCTPCT 1.5   Sepsis Labs: No results for input(s): PROCALCITON, LATICACIDVEN in the last 168 hours.  Recent Results (from the past 240 hour(s))  Expectorated sputum assessment w rflx to resp cult     Status: None   Collection Time: 01/28/18  9:11 AM  Result Value Ref Range Status   Specimen Description SPUTUM  Final   Special Requests NONE  Final   Sputum evaluation   Final    THIS SPECIMEN IS ACCEPTABLE FOR SPUTUM CULTURE Performed at [facility], [address] [place], [place] [address]    Report Status 01/28/2018  FINAL  Final         Radiology Studies: Dg Chest 2 View  Result Date: 02-Feb-2018 CLINICAL DATA:  Productive cough.  Shortness of breath. EXAM: CHEST - 2 VIEW COMPARISON:  Radiographs of January 24, 2018. FINDINGS: Stable cardiomegaly with central pulmonary vascular congestion. No pneumothorax or pleural effusion is noted. No consolidative process is noted. Bony thorax is unremarkable. IMPRESSION: Stable cardiomegaly with central pulmonary vascular congestion. Electronically Signed   By: Marijo Conception, M.D.   On: 2018/02/02 21:39   Ct Angio Chest Pe W Or Wo Contrast  Result Date: 01/28/2018 CLINICAL DATA:  PE suspected, intermediate probability, positive D-dimer. EXAM: CT ANGIOGRAPHY CHEST WITH CONTRAST TECHNIQUE: Multidetector CT imaging of the chest was performed using the standard protocol during bolus administration of intravenous contrast. Multiplanar CT image reconstructions and MIPs were obtained to evaluate the vascular anatomy. CONTRAST:  57mL ISOVUE-370 IOPAMIDOL (ISOVUE-370) INJECTION 76% COMPARISON:  05/30/2017 FINDINGS: Cardiovascular: Satisfactory opacification of the pulmonary arteries to the  segmental level. No evidence of pulmonary embolism. Borderline heart size. No pericardial effusion. Aortic and coronary atherosclerosis. Mediastinum/Nodes: Negative for adenopathy or mass. Lungs/Pleura: Generalized airway thickening. Clustered micro nodules in the lower lungs. Mosaic attenuation in the lower lungs from air trapping. No edema, effusion, or pneumothorax Upper Abdomen: Negative Musculoskeletal: Spondylosis.  No acute finding Review of the MIP images confirms the above findings. IMPRESSION: 1. Bronchitis with small airway impaction versus infectious bronchiolitis in the lower lobes. 2. Negative for pulmonary embolism. Electronically Signed   By: Monte Fantasia M.D.   On: 01/28/2018 05:20        Scheduled Meds: . sodium chloride   Intravenous Once  . acidophilus  1 capsule Oral  Daily  . [START ON 01/29/2018] azithromycin  250 mg Oral Daily  . [START ON 01/29/2018] calcitRIOL  0.25 mcg Oral Q M,W,F-HD  . Chlorhexidine Gluconate Cloth  6 each Topical Q0600  . cholecalciferol  5,000 Units Oral Daily  . hydrALAZINE  100 mg Oral Daily  . iopamidol      . levalbuterol  1.25 mg Nebulization TID  . metoprolol tartrate  75 mg Oral BID  . mometasone-formoterol  2 puff Inhalation BID  . multivitamin  1 tablet Oral QHS  . nicotine  21 mg Transdermal Daily  . pantoprazole  40 mg Oral BID  . pravastatin  40 mg Oral Daily  . sucroferric oxyhydroxide  1,500 mg Oral TID WC   Continuous Infusions:   LOS: 0 days        Aline August, MD [facility] Pager [phone]  If 7PM-7AM, please contact night-coverage [email or web address] Password [facility] 01/28/2018, 12:28 PM

## 2018-01-28 NOTE — Consult Note (Signed)
Renal Service Consult Note Kirk [name] 01/28/2018 Kirk [name] Requesting Physician:  Kirk [name], [place]  Reason for Consult:  [name] w/ SOB HPI: The patient is a 58 y.o. year-old with hx of HTN, PNA, OSA, failed transplant, hypotension, gout, ESRD on HD MWF.  Pt had full Rx yest and left 1L over edw.  Presented to ED w/ SOB, is on Bipap now.  Recently had birthday celebration per pt's wife.  Feeling better on bipap.  CXR shows vasc congestion and IS edema. Asked to see for dialysis.   Pt denies purulent sputum, fevers, CP, no abd pain or n/v/d.     ROS  denies CP  no joint pain   no HA  no blurry vision  no rash  no diarrhea  no nausea/ vomiting  Past Medical History  Past Medical History:  Diagnosis Date  . Acute edema of lung, unspecified   . Acute, but ill-defined, cerebrovascular disease   . Allergy   . Anemia   . 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)   . Anxiety   . Asthma   . Carpal tunnel syndrome   . Cellulitis and abscess of trunk   . Cholelithiasis 07/13/2014  . Chronic headaches   . Debility, unspecified   . Dermatophytosis of the body   . Dysrhythmia    history of  . Edema   . End stage renal disease on dialysis [facility])    "MWF; [facility]" (10/21/2014)  . Essential hypertension, benign   . GERD (gastroesophageal reflux disease)   . Gout, unspecified   . HTN (hypertension)   . Hypertrophy of prostate without urinary obstruction and other lower urinary tract symptoms (LUTS)   . Hypotension, unspecified   . Impotence of organic origin   . Insomnia, unspecified   . Kidney replaced by transplant   . Localization-related (focal) (partial) epilepsy and epileptic syndromes with complex partial seizures, without mention of intractable epilepsy   . Lumbago   . Memory loss   . OSA on CPAP   . Other and unspecified hyperlipidemia   . Other chronic nonalcoholic liver disease   . Other malaise and fatigue    . Other nonspecific abnormal serum enzyme levels   . Pain in joint, lower leg   . Pain in joint, upper arm   . Pneumonia "several times"  . Renal dialysis status(V45.11) 02/05/2010   restarted 01/02/13 ofter renal trransplant failure  . Secondary hyperparathyroidism (of renal origin)   . Shortness of breath   . Sleep apnea   . Tension headache   . Unspecified constipation   . Unspecified essential hypertension   . Unspecified hereditary and idiopathic peripheral neuropathy   . Unspecified vitamin D deficiency    Past Surgical History  Past Surgical History:  Procedure Laterality Date  . AV FISTULA PLACEMENT Left ?2010   "forearm; at [facility]"  . BACK SURGERY    . CARDIAC CATHETERIZATION  03/21/2011  . CHOLECYSTECTOMY N/A 10/21/2014   Procedure: LAPAROSCOPIC CHOLECYSTECTOMY WITH INTRAOPERATIVE CHOLANGIOGRAM;  Surgeon: Kirk Messing III, [name];  Location: [facility];  Service: General;  Laterality: N/A;  . COLONOSCOPY    . 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 KIDNEY TRANSPLANT  08/17/2011   [place]  10/21/2014   w/IOC  . LEFT HEART CATHETERIZATION WITH CORONARY ANGIOGRAM N/A 03/21/2011   Procedure: LEFT HEART CATHETERIZATION WITH CORONARY ANGIOGRAM;  Surgeon: Kirk Corwin.  Hilty, [name];  Location: [facility];  Service: Cardiovascular;  Laterality: N/A;  . NEPHRECTOMY  08/2013   removed transplaned kidney  . POSTERIOR FUSION CERVICAL SPINE  06/25/2012   for spinal stenosis  . VASECTOMY  2010   Family History  Family History  Adopted: Yes  Problem Relation Age of Onset  . Colon cancer Neg Hx   . Esophageal cancer Neg Hx   . Rectal cancer Neg Hx   . Stomach cancer Neg Hx    Social History  reports that he has been smoking e-cigarettes. He has a 16.00 pack-year smoking history. He has never used smokeless tobacco. He reports that he does not drink alcohol or use drugs. Allergies  Allergies  Allergen Reactions  . Codeine  Nausea And Vomiting   Home medications Prior to Admission medications   Medication Sig Start Date End Date Taking? Authorizing Provider  acetaminophen (TYLENOL) 500 MG tablet Take 1 tablet (500 mg total) by mouth every 6 (six) hours as needed for moderate pain. 09/12/14  Yes Kirk Cota, [name]  albuterol (PROVENTIL HFA;VENTOLIN HFA) 108 (90 Base) MCG/ACT inhaler Inhale 2 puffs into the lungs every 6 (six) hours as needed for wheezing or shortness of breath. 06/05/17  Yes Kirk Pour, [name]  aspirin EC 81 MG tablet Take 81 mg by mouth daily.   Yes Provider, Historical, [name]  b complex-vitamin c-folic acid (NEPHRO-VITE) 0.8 MG TABS tablet TAKE 1 TABLET BY MOUTH EVERY DAY Patient taking differently: Take 1 tablet by mouth daily.  09/19/17  Yes Kirk [name]  budesonide-formoterol (SYMBICORT) 160-4.5 MCG/ACT inhaler Take 2 puffs first thing in am and then another 2 puffs about 12 hours later. 10/02/17  Yes Kirk [name]  calcitRIOL (ROCALTROL) 0.25 MCG capsule Take 1 capsule (0.25 mcg total) by mouth every Monday, Wednesday, and Friday with hemodialysis. 09/21/14  Yes Kirk [name]  Cholecalciferol (VITAMIN D3) 5000 UNITS TABS Take 1 tablet by mouth daily.   Yes Provider, Historical, [name]  clonazePAM (KLONOPIN) 0.5 MG tablet TAKE 1 TABLET (0.5 MG TOTAL) BY MOUTH DAILY AS NEEDED FOR ANXIETY. 10/23/17  Yes Kirk [name]  diphenhydrAMINE (BENADRYL) 25 MG tablet Take 25 mg by mouth at bedtime as needed for allergies.   Yes Provider, Historical, [name]  guaiFENesin-dextromethorphan (ROBITUSSIN DM) 100-10 MG/5ML syrup Take 10 mLs by mouth every 4 (four) hours as needed for cough.   Yes Provider, Historical, [name]  hydrALAZINE (APRESOLINE) 50 MG tablet Take 100 mg by mouth daily.   Yes Provider, Historical, [name]  metoprolol tartrate (LOPRESSOR) 50 MG tablet Take 1.5 tablets (75 mg total) by mouth 2 (two) times daily. Take One and a half tablet by mouth twice daily 10/25/17  Yes Kirk [name]   omeprazole (PRILOSEC) 20 MG capsule TAKE 1 CAPSULE BY MOUTH 2 TIMES DAILY BEFORE A MEAL. Patient taking differently: Take 20 mg by mouth 2 (two) times daily before a meal.  12/26/16  Yes Kirk [name]  ondansetron (ZOFRAN ODT) 4 MG disintegrating tablet Take 1 tablet (4 mg total) by mouth every 8 (eight) hours as needed for nausea or vomiting. 10/26/16  Yes Carmon Sails J, PA-C  pravastatin (PRAVACHOL) 40 MG tablet TAKE 1 TABLET EVERY EVENING Patient taking differently: Take 40 mg by mouth daily.  10/25/17  Yes Kirk [name]  Probiotic Product (PROBIOTIC-10 PO) Take 1 capsule by mouth daily.    Yes Provider, Historical, [name]  sucroferric oxyhydroxide (VELPHORO) 500 MG  chewable tablet Chew 500-1,500 mg by mouth See admin instructions. Take 1500 mg with meals and 500 mg with snacks   Yes Provider, Historical, [name]  traMADol (ULTRAM) 50 MG tablet One every 4 hours as needed for cough Patient taking differently: Take 50 mg by mouth every 6 (six) hours as needed for moderate pain (cough).  12/30/17  Yes Kirk [name]  UNABLE TO FIND CPAP: During all periods of rest (naps and at bedtime)   Yes Provider, Historical, [name]   Liver Function Tests No results for input(s): AST, ALT, ALKPHOS, BILITOT, PROT, ALBUMIN in the last 168 hours. No results for input(s): LIPASE, AMYLASE in the last 168 hours. CBC Recent Labs  Lab 01/27/18 2100 01/28/18 0127 01/28/18 0732  WBC 3.7* 4.0 3.5*  NEUTROABS 2.6  --   --   HGB 7.8* 8.1* 8.0*  HCT 24.9* 25.8* 25.2*  MCV 101.2* 102.0* 102.9*  PLT 97* 93* 98*   Basic Metabolic Panel Recent Labs  Lab 01/27/18 2100 01/28/18 0618  NA 139 139  K 3.3* 3.8  CL 99 98  CO2 29 28  GLUCOSE 146* 101*  BUN 18 23*  CREATININE 8.45* 10.07*  CALCIUM 8.7* 8.9   Iron/TIBC/Ferritin/ %Sat    Component Value Date/Time   IRON 31 ([name]) 01/28/2018 0127   TIBC 197 ([name]) 01/28/2018 0127   FERRITIN 755 (H) 01/28/2018 0127   IRONPCTSAT 16 ([name]) 01/28/2018 0127     Vitals:   01/28/18 0700 01/28/18 0721 01/28/18 0800 01/28/18 0932  BP: (!) 168/94 (!) 168/94 (!) 164/89 (!) 172/76  Pulse: 63 75 81 79  Resp:  18    Temp:      TempSrc:      SpO2: 98% 100% 100%    Exam Gen on bipap, calm and responsive No rash, cyanosis or gangrene Sclera anicteric, throat not seen  No jvd or bruits Chest some [name] basilar crackles, R clear on bipap RRR no MRG Abd soft ntnd no mass or ascites +bs GU defer MS no joint effusions or deformity Ext no LE or UE edema, no wounds or ulcers Neuro is alert, Ox 3 , nf LFA AVF+bruit    Home meds:  - aspirin 81/ pravastatin 40hs  - sucroferric oxyhydroxide 1500 ac tid/ calcitriol w hd 0.25 ug  - metoprolol 75 bid/ hydralazine 100 qd  - tramadol qid 50mg  prn/ clonazepam 0.5 bid prn  - budesonide-formoterol 2 bid  - omeprazole 40 qd  - prns/ vitamins   Dialysis: MWF    4h 58min   450/A2   98.5kg  2/2 bath  Hep 4000  LFA AVF  - V50 weekly  - M150 every 2 wks  - calcitriol 1.0 tiw     Assessment: 1. Acute pulm edema w/ resp failure 2. Chest pain /^troponin 3. ESRD on HD 4. HTN 5. MBD ckd 6. Anemia ckd  P: 1. HD today asap, cont bipap, when off w/ HD/ UF     Kelly Splinter [name] [place] pager [phone]   01/28/2018, 12:00 PM

## 2018-01-28 NOTE — Progress Notes (Signed)
RT set up cpap and placed on patient. Patient tolerating well at this time. RT will monitor as needed.

## 2018-01-28 NOTE — Consult Note (Signed)
Cardiology Consultation:   Patient ID: Kirk [name] MRN: [id number]; DOB: [birth year]  Admit date: 01/27/2018 Date of Consult: 01/28/2018  Primary Care Provider: Gayland Curry, DO Primary Cardiologist: Dr. Debara Pickett   Patient Profile:   Kirk [name] is a 58 y.o. male with a hx of end-stage renal disease on hemodialysis, intermittent rectal bleeding, chronic anemia, hypertension, gout, hyperlipidemia, OSA on CPAP, tobacco smoking and failed kidney transplant who is being seen today for the evaluation of chest pain at the request of Dr. Starla Link.  Patient had a cardiac catheterization in 2013 by Dr. Debara Pickett for chest pain. Impression: 1.  No signfiicant obstructive CAD. 2.  Chest pain may be chest wall cramping at dialysis, related to hypotension, or non-cardiac cause.  He had a low risk stress test 04/2013 for kidney transplant work-up.  Echocardiogram August 2018 showed mild LVH with LVEF 55-60% and grossly normal diastolic function.  History of Present Illness:   Kirk [name] presented yesterday with chest pain, shortness of breath and rectal bleeding.  Patient states that he has intermittent chest pain which he described as a sharp/pressure mostly occurring after dialysis.  Lasts for few minutes to hours.  Patient has chronic shortness of breath with exertion.  He can walk 1-2 blocks and then starts to having dyspnea which resolved with rest.  Patient endorses intermittent GI bleeding and recently evaluated by Dr. Ardis Hughs.  Due to worsening symptoms he came to ER.  Denies missing any dialysis session.  He was found hypoxic with stable cardiomegaly and pulmonary vascular congestion.  Elevated d-dimer.  CT angiogram of the chest negative for pulmonary embolism however showed bronchitis with small airway infection versus infectious bronchiolitis.  Started on azithromycin.  Troponin 0.03>>> 0.09.  LDL 51.  Hemoglobin 8.  TSH normal.  A1c 5.1.  Urine drug screen clear.  EKG yesterday shows sinus  rhythm at rate of 94 bpm and LVH-personally reviewed. EKG this morning showed atrial fibrillation at rate of 126 bpm-personally reviewed. Repeat EKG shows sinus rhythm at rate of 72 bpm with minimal diffuse ST elevation with features concerning for pericarditis test personally reviewed.  Patient currently undergoing dialysis.  Only able to assess telemetry during dialysis which showed sinus rhythm.  No chest pain currently.  Denies cough or congestion.  Past Medical History:  Diagnosis Date  . Acute edema of lung, unspecified   . Acute, but ill-defined, cerebrovascular disease   . Allergy   . Anemia   . 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)   . Anxiety   . Asthma   . Carpal tunnel syndrome   . Cellulitis and abscess of trunk   . Cholelithiasis 07/13/2014  . Chronic headaches   . Debility, unspecified   . Dermatophytosis of the body   . Dysrhythmia    history of  . Edema   . End stage renal disease on dialysis [facility])    "MWF; [facility]" (10/21/2014)  . Essential hypertension, benign   . GERD (gastroesophageal reflux disease)   . Gout, unspecified   . HTN (hypertension)   . Hypertrophy of prostate without urinary obstruction and other lower urinary tract symptoms (LUTS)   . Hypotension, unspecified   . Impotence of organic origin   . Insomnia, unspecified   . Kidney replaced by transplant   . Localization-related (focal) (partial) epilepsy and epileptic syndromes with complex partial seizures, without mention of intractable epilepsy   . Lumbago   . Memory loss   . OSA on CPAP   .  Other and unspecified hyperlipidemia   . Other chronic nonalcoholic liver disease   . Other malaise and fatigue   . Other nonspecific abnormal serum enzyme levels   . Pain in joint, lower leg   . Pain in joint, upper arm   . Pneumonia "several times"  . Renal dialysis status(V45.11) 02/05/2010   restarted 01/02/13 ofter renal trransplant failure  . Secondary  hyperparathyroidism (of renal origin)   . Shortness of breath   . Sleep apnea   . Tension headache   . Unspecified constipation   . Unspecified essential hypertension   . Unspecified hereditary and idiopathic peripheral neuropathy   . Unspecified vitamin D deficiency     Past Surgical History:  Procedure Laterality Date  . AV FISTULA PLACEMENT Left ?2010   "forearm; at [facility]"  . BACK SURGERY    . CARDIAC CATHETERIZATION  03/21/2011  . CHOLECYSTECTOMY N/A 10/21/2014   Procedure: LAPAROSCOPIC CHOLECYSTECTOMY WITH INTRAOPERATIVE CHOLANGIOGRAM;  Surgeon: Autumn Messing III, MD;  Location: [facility];  Service: General;  Laterality: N/A;  . COLONOSCOPY    . 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 KIDNEY TRANSPLANT  08/17/2011   [place]  10/21/2014   w/IOC  . LEFT HEART CATHETERIZATION WITH CORONARY ANGIOGRAM N/A 03/21/2011   Procedure: LEFT HEART CATHETERIZATION WITH CORONARY ANGIOGRAM;  Surgeon: [name], MD;  Location: [facility] CATH LAB;  Service: Cardiovascular;  Laterality: N/A;  . NEPHRECTOMY  08/2013   removed transplaned kidney  . POSTERIOR FUSION CERVICAL SPINE  06/25/2012   for spinal stenosis  . VASECTOMY  2010    Inpatient Medications: Scheduled Meds: . sodium chloride   Intravenous Once  . acidophilus  1 capsule Oral Daily  . [START ON 01/29/2018] azithromycin  250 mg Oral Daily  . [START ON 01/29/2018] calcitRIOL  0.25 mcg Oral Q M,W,F-HD  . Chlorhexidine Gluconate Cloth  6 each Topical Q0600  . cholecalciferol  5,000 Units Oral Daily  . heparin      . heparin  4,000 Units Dialysis Once in dialysis  . hydrALAZINE  100 mg Oral Daily  . iopamidol      . levalbuterol  1.25 mg Nebulization TID  . methylPREDNISolone (SOLU-MEDROL) injection  60 mg Intravenous Q8H  . metoprolol tartrate  75 mg Oral BID  . mometasone-formoterol  2 puff Inhalation BID  . multivitamin  1 tablet Oral QHS  . nicotine  21 mg Transdermal  Daily  . pantoprazole  40 mg Oral BID  . pravastatin  40 mg Oral Daily  . sucroferric oxyhydroxide  1,500 mg Oral TID WC   Continuous Infusions:  PRN Meds: acetaminophen **OR** acetaminophen, clonazePAM, diphenhydrAMINE, guaiFENesin-dextromethorphan, hydrALAZINE, hydrOXYzine, levalbuterol, morphine injection, nitroGLYCERIN, sucroferric oxyhydroxide, traMADol, zolpidem  Allergies:    Allergies  Allergen Reactions  . Codeine Nausea And Vomiting    Social History:   Social History   Socioeconomic History  . Marital status: Married    Spouse name: [name]  . Number of children: 3  . Years of education: Not on file  . Highest education level: Not on file  Occupational History  . Occupation: disabled    Employer: DISABLED  Social Needs  . Financial resource strain: Not hard at all  . Food insecurity:    Worry: Never true    Inability: Never true  . Transportation needs:    Medical: No    Non-medical: No  Tobacco Use  . Smoking status: Current  Every Day Smoker    Packs/day: 0.50    Years: 32.00    Pack years: 16.00    Types: E-cigarettes  . Smokeless tobacco: Never Used  . Tobacco comment: 1 cigarette every couple days  Substance and Sexual Activity  . Alcohol use: No    Alcohol/week: 0.0 standard drinks  . Drug use: No  . Sexual activity: Yes  Lifestyle  . Physical activity:    Days per week: 3 days    Minutes per session: 20 min  . Stress: Only a little  Relationships  . Social connections:    Talks on phone: More than three times a week    Gets together: More than three times a week    Attends religious service: More than 4 times per year    Active member of club or organization: No    Attends meetings of clubs or organizations: Never    Relationship status: Married  . Intimate partner violence:    Fear of current or ex partner: No    Emotionally abused: No    Physically abused: No    Forced sexual activity: No  Other Topics Concern  . Not on file    Social History Narrative   Drinks 1 cup of caffeine daily.    Family History:   Family History  Adopted: Yes  Problem Relation Age of Onset  . Colon cancer Neg Hx   . Esophageal cancer Neg Hx   . Rectal cancer Neg Hx   . Stomach cancer Neg Hx      ROS:  Please see the history of present illness.  All other ROS reviewed and negative.     Physical Exam/Data:   Vitals:   01/28/18 0800 01/28/18 0932 01/28/18 1249 01/28/18 1255  BP: (!) 164/89 (!) 172/76  (!) 177/85  Pulse: 81 79  77  Resp:    19  Temp:    98.3 F (36.8 C)  TempSrc:    Oral  SpO2: 100%   99%  Weight:   98.4 kg   Height:   5\' 7"  (1.702 m)    No intake or output data in the 24 hours ending 01/28/18 1544 Filed Weights   01/28/18 1249  Weight: 98.4 kg   Body mass index is 33.98 kg/m.  General:  Well nourished, well developed, in no acute distress HEENT: normal Lymph: no adenopathy Neck: no JVD Endocrine:  No thryomegaly Vascular: No carotid bruits; FA pulses 2+ bilaterally without bruits  Cardiac:  normal S1, S2; RRR; no murmur  Lungs:  clear to auscultation bilaterally, no wheezing, rhonchi or rales  Abd: soft, nontender, no hepatomegaly  Ext: no edema Musculoskeletal:  No deformities, BUE and BLE strength normal and equal Skin: warm and dry  Neuro:  CNs 2-12 intact, no focal abnormalities noted Psych:  Normal affect   Relevant CV Studies:  Cath 02/2011 Procedure: The patient was brought to the [facility] in the fasting state and prepped and draped in the usual sterile fashion for (Right groin) access.  Sterile technique was used including antiseptics, cap, gloves, gown, hand hygiene, mask and sheet.  Skin prep: Chlorhexidine;  Time Out: Verified patient identification, verified procedure, site/side was marked, verified correct patient position, special equipment/implants available, medications/allergies/relevent history reviewed, required imaging and test  results available.  Performed  @DHRADCATH @ @DHGROINCATH @ @DHGRAFTANGIO @  The patient was transported to the cath lab holding area in stable condition.   The patient  was stable before,  during and following the procedure.   Patient did tolerate procedure well. There were not complications. EBL: <10 cc  Medications:             Sedation:  2 mg IV Versed, 50 IV mcg Fentanyl             Contrast:  85 cc Omnipaque              Hemodynamics:             Central Aortic Pressure / Mean Aortic Pressure: 150/83             LV Pressure / LV End diastolic Pressure:  21  Left Ventriculography:             EF:  65%             Wall Motion: Normal wall motion, no MR  Coronary Angiographic Data:  No significant obstructive coronary artery disease. Right dominant system. Large caliber vessels.  Impression: 1.  No signfiicant obstructive CAD. 2.  Chest pain may be chest wall cramping at dialysis, related to hypotension, or non-cardiac cause.   Plan: 1. Discharge home today. Follow-up with me in the office.  Laboratory Data:  Chemistry Recent Labs  Lab 01/27/18 2100 01/28/18 0618  NA 139 139  K 3.3* 3.8  CL 99 98  CO2 29 28  GLUCOSE 146* 101*  BUN 18 23*  CREATININE 8.45* 10.07*  CALCIUM 8.7* 8.9  GFRNONAA 6* 5*  GFRAA 7* 6*  ANIONGAP 11 13    No results for input(s): PROT, ALBUMIN, AST, ALT, ALKPHOS, BILITOT in the last 168 hours. Hematology Recent Labs  Lab 01/27/18 2100 01/28/18 0127 01/28/18 0732  WBC 3.7* 4.0 3.5*  RBC 2.46* 2.53*  2.53* 2.45*  HGB 7.8* 8.1* 8.0*  HCT 24.9* 25.8* 25.2*  MCV 101.2* 102.0* 102.9*  MCH 31.7 32.0 32.7  MCHC 31.3 31.4 31.7  RDW 14.3 14.4 14.4  PLT 97* 93* 98*   Cardiac Enzymes Recent Labs  Lab 01/28/18 0127 01/28/18 0618  TROPONINI 0.03* 0.09*    Recent Labs  Lab 01/27/18 2118  TROPIPOC 0.03    BNP Recent Labs  Lab 01/27/18 2100  BNP 951.2*    DDimer  Recent Labs  Lab 01/28/18 0242  DDIMER 0.76*     Radiology/Studies:  Dg Chest 2 View  Result Date: 01/27/2018 CLINICAL DATA:  Productive cough.  Shortness of breath. EXAM: CHEST - 2 VIEW COMPARISON:  Radiographs of January 24, 2018. FINDINGS: Stable cardiomegaly with central pulmonary vascular congestion. No pneumothorax or pleural effusion is noted. No consolidative process is noted. Bony thorax is unremarkable. IMPRESSION: Stable cardiomegaly with central pulmonary vascular congestion. Electronically Signed   By: Marijo Conception, M.D.   On: 01/27/2018 21:39   Ct Angio Chest Pe W Or Wo Contrast  Result Date: 01/28/2018 CLINICAL DATA:  PE suspected, intermediate probability, positive D-dimer. EXAM: CT ANGIOGRAPHY CHEST WITH CONTRAST TECHNIQUE: Multidetector CT imaging of the chest was performed using the standard protocol during bolus administration of intravenous contrast. Multiplanar CT image reconstructions and MIPs were obtained to evaluate the vascular anatomy. CONTRAST:  72mL ISOVUE-370 IOPAMIDOL (ISOVUE-370) INJECTION 76% COMPARISON:  05/30/2017 FINDINGS: Cardiovascular: Satisfactory opacification of the pulmonary arteries to the segmental level. No evidence of pulmonary embolism. Borderline heart size. No pericardial effusion. Aortic and coronary atherosclerosis. Mediastinum/Nodes: Negative for adenopathy or mass. Lungs/Pleura: Generalized airway thickening. Clustered micro nodules in the lower lungs. Mosaic attenuation in the lower lungs from  air trapping. No edema, effusion, or pneumothorax Upper Abdomen: Negative Musculoskeletal: Spondylosis.  No acute finding Review of the MIP images confirms the above findings. IMPRESSION: 1. Bronchitis with small airway impaction versus infectious bronchiolitis in the lower lobes. 2. Negative for pulmonary embolism. Electronically Signed   By: Monte Fantasia M.D.   On: 01/28/2018 05:20    Assessment and Plan:   1. Chest pain/dyspnea on exertion/elevated troponin -He has chronic chest pain mostly  after dialysis lasting for minutes to hours.  So has chronic dyspnea on exertion.  His volume managed by dialysis. -Now presenting with bronchitis versus bronchiolitis and started on antibiotic. -He also has intermittent lower GI bleed. -No significant CAD by cath in 2013 and has low risk stress test in 2015. -His symptoms could be due to low volume during dialysis which can be chronic however current symptoms could be due to bronchitis versus possible pericarditis.  However symptom does not sound pleuritic in nature.  EKG with nonspecific changes in ST segment.  Troponin minimally elevated 0.03 >>0.09.  This could be demand in setting of end-stage renal disease, anemia and possible bronchitis. -Will review plan with MD. -Not on aspirin due to GI bleed.  Continue beta-blocker and statin.  2.  Paroxysmal atrial fibrillation with rapid ventricular rate -EKG this morning showed atrial fibrillation at rate of 123 BPM.  Patient does have history of intermittent palpitation.  We need to review telemetry in detail once back to his room (current telemetry only showing data during dialysis session).  - CHADSVASC score of 1 for HTN.  -Not a candidate for anticoagulation given recurrent GI bleed.  3.  End-stage renal disease on HD MWF with history of failed renal transplant -Undergoing dialysis session  4.  Acute hypoxic respiratory failure secondary to COPD exacerbation versus bronchitis -On ABX primary team  5.  Tobacco smoking -Advised cessation  6.  Anemia with GI bleed/chronic thrombocytopenia -Baseline hemoglobin between 9-11.   -Hgb of 7.8 on presentation>>8.1>>8.0 - Per primary team   Dr. Debara Pickett to see later today.    For questions or updates, please contact [facility] Please consult [email or web address] for contact info under     Jarrett Soho, PA  01/28/2018 3:44 PM

## 2018-01-28 NOTE — Plan of Care (Signed)

## 2018-01-28 NOTE — ED Notes (Signed)
Patient transported to CT 

## 2018-01-28 NOTE — Progress Notes (Signed)
Patient transported to HD at 1300

## 2018-01-28 NOTE — ED Notes (Signed)
Pt aware of need for urine sample, urinal at bedside 

## 2018-01-29 ENCOUNTER — Other Ambulatory Visit: Payer: Self-pay

## 2018-01-29 ENCOUNTER — Inpatient Hospital Stay (HOSPITAL_COMMUNITY): Payer: Medicare Other

## 2018-01-29 DIAGNOSIS — G4733 Obstructive sleep apnea (adult) (pediatric): Secondary | ICD-10-CM

## 2018-01-29 DIAGNOSIS — Z9989 Dependence on other enabling machines and devices: Secondary | ICD-10-CM

## 2018-01-29 DIAGNOSIS — R7989 Other specified abnormal findings of blood chemistry: Secondary | ICD-10-CM

## 2018-01-29 DIAGNOSIS — R0789 Other chest pain: Secondary | ICD-10-CM

## 2018-01-29 DIAGNOSIS — R0602 Shortness of breath: Secondary | ICD-10-CM

## 2018-01-29 DIAGNOSIS — F17210 Nicotine dependence, cigarettes, uncomplicated: Secondary | ICD-10-CM

## 2018-01-29 DIAGNOSIS — D631 Anemia in chronic kidney disease: Secondary | ICD-10-CM

## 2018-01-29 LAB — CBC WITH DIFFERENTIAL/PLATELET
Abs Immature Granulocytes: 0.01 10*3/uL (ref 0.00–0.07)
Basophils Absolute: 0 10*3/uL (ref 0.0–0.1)
Basophils Relative: 0 %
Eosinophils Absolute: 0 10*3/uL (ref 0.0–0.5)
Eosinophils Relative: 0 %
HCT: 28.8 % — ABNORMAL LOW (ref 39.0–52.0)
Hemoglobin: 9.2 g/dL — ABNORMAL LOW (ref 13.0–17.0)
Immature Granulocytes: 0 %
Lymphocytes Relative: 13 %
Lymphs Abs: 0.6 10*3/uL — ABNORMAL LOW (ref 0.7–4.0)
MCH: 32.3 pg (ref 26.0–34.0)
MCHC: 31.9 g/dL (ref 30.0–36.0)
MCV: 101.1 fL — ABNORMAL HIGH (ref 80.0–100.0)
MONO ABS: 0.4 10*3/uL (ref 0.1–1.0)
Monocytes Relative: 7 %
Neutro Abs: 3.8 10*3/uL (ref 1.7–7.7)
Neutrophils Relative %: 80 %
Platelets: 153 10*3/uL (ref 150–400)
RBC: 2.85 MIL/uL — ABNORMAL LOW (ref 4.22–5.81)
RDW: 13.9 % (ref 11.5–15.5)
WBC: 4.8 10*3/uL (ref 4.0–10.5)
nRBC: 0 % (ref 0.0–0.2)

## 2018-01-29 LAB — ECHOCARDIOGRAM COMPLETE
Height: 67 in
Weight: 3360 oz

## 2018-01-29 LAB — CULTURE, RESPIRATORY

## 2018-01-29 LAB — BASIC METABOLIC PANEL
ANION GAP: 9 (ref 5–15)
BUN: 20 mg/dL (ref 6–20)
CALCIUM: 9.8 mg/dL (ref 8.9–10.3)
CO2: 30 mmol/L (ref 22–32)
Chloride: 99 mmol/L (ref 98–111)
Creatinine, Ser: 7.98 mg/dL — ABNORMAL HIGH (ref 0.61–1.24)
GFR calc Af Amer: 8 mL/min — ABNORMAL LOW (ref >60)
GFR, EST NON AFRICAN AMERICAN: 7 mL/min — AB (ref >60)
Glucose, Bld: 128 mg/dL — ABNORMAL HIGH (ref 70–99)
POTASSIUM: 4.6 mmol/L (ref 3.5–5.1)
Sodium: 138 mmol/L (ref 135–145)

## 2018-01-29 LAB — CULTURE, RESPIRATORY W GRAM STAIN

## 2018-01-29 LAB — MAGNESIUM: Magnesium: 2.1 mg/dL (ref 1.7–2.4)

## 2018-01-29 NOTE — Care Management Note (Signed)
Case Management Note  Patient Details  Name: Kirk [name] MRN: [id number] Date of Birth: [birth year]  Subjective/Objective:   Acute Resp Failure with Hypoxia                Action/Plan: Patient lives at home; PCP: Gayland Curry, DO; has private insurance with Medicare / BCBS with prescription drug coverage; CM will continue to follow for progression of care.  Expected Discharge Date:      Possibly 02/02/2018            Expected Discharge Plan:  Home/Self Care  Discharge planning Services  CM Consult  Status of Service:  In process, will continue to follow  Sherrilyn Rist [phone] 01/29/2018, 10:21 AM

## 2018-01-29 NOTE — Progress Notes (Signed)
  Echocardiogram 2D Echocardiogram has been performed.  Kirk [name] 01/29/2018, 9:46 AM

## 2018-01-29 NOTE — Progress Notes (Signed)
[facility] Progress Note  Subjective: 3L off net yest, feeling much better today  Vitals:   01/29/18 1034 01/29/18 1148 01/29/18 1244 01/29/18 1300  BP: (!) 185/92  (!) 176/81   Pulse: 89  72   Resp:   20   Temp:   98.6 F (37 C)   TempSrc:   Oral   SpO2:  98% 99% 98%  Weight:      Height:        Inpatient medications: . acidophilus  1 capsule Oral Daily  . azithromycin  250 mg Oral Daily  . calcitRIOL  0.25 mcg Oral Q M,W,F-HD  . Chlorhexidine Gluconate Cloth  6 each Topical Q0600  . cholecalciferol  5,000 Units Oral Daily  . hydrALAZINE  100 mg Oral Daily  . levalbuterol  1.25 mg Nebulization TID  . methylPREDNISolone (SOLU-MEDROL) injection  60 mg Intravenous Q8H  . metoprolol tartrate  75 mg Oral BID  . mometasone-formoterol  2 puff Inhalation BID  . multivitamin  1 tablet Oral QHS  . nicotine  21 mg Transdermal Daily  . pantoprazole  40 mg Oral BID  . pravastatin  40 mg Oral Daily  . sucroferric oxyhydroxide  1,500 mg Oral TID WC    acetaminophen **OR** acetaminophen, clonazePAM, diphenhydrAMINE, guaiFENesin-dextromethorphan, hydrALAZINE, hydrOXYzine, levalbuterol, morphine injection, nitroGLYCERIN, sucroferric oxyhydroxide, traMADol, zolpidem  Iron/TIBC/Ferritin/ %Sat    Component Value Date/Time   IRON 31 (L) 01/28/2018 0127   TIBC 197 (L) 01/28/2018 0127   FERRITIN 755 (H) 01/28/2018 0127   IRONPCTSAT 16 (L) 01/28/2018 0127    Exam: Gen on bipap, calm No jvd or bruits Chest clear bilat, no rales RRR no MRG Abd soft ntnd no mass or ascites +bs Ext no LE or UE edema Neuro is alert, Ox 3 , nf LFA AVF+bruit    Home meds:  - aspirin 81/ pravastatin 40hs  - sucroferric oxyhydroxide 1500 ac tid/ calcitriol w hd 0.25 ug  - metoprolol 75 bid/ hydralazine 100 qd  - tramadol qid 50mg  prn/ clonazepam 0.5 bid prn  - budesonide-formoterol 2 bid  - omeprazole 40 qd  - prns/ vitamins   Dialysis: MWF    4h 62min   450/A2   98.5kg  2/2  bath  Hep 4000  LFA AVF  - V50 weekly  - M150 every 2 wks  - calcitriol 1.0 tiw     Assessment: 1. Resp failure/ pulm edema 2. Vol overload 3. ESRD on HD MWF: w/ lean body wt loss 4. Chest pain /^troponin 5. HTN 6. MBD ckd 7. Anemia ckd  P: 1. Pt improving, lowering dry wt, HD again today. May be ready for dc tomorrow am.  Will follow.   Kelly Splinter MD [employer] pager [phone]   01/29/2018, 2:25 PM   Recent Labs  Lab 01/28/18 0127 01/28/18 0618 01/29/18 0655  NA  --  139 138  K  --  3.8 4.6  CL  --  98 99  CO2  --  28 30  GLUCOSE  --  101* 128*  BUN  --  23* 20  CREATININE  --  10.07* 7.98*  CALCIUM  --  8.9 9.8  INR 1.12  --   --    No results for input(s): AST, ALT, ALKPHOS, BILITOT, PROT in the last 168 hours. Recent Labs  Lab 01/27/18 2100  01/28/18 0732 01/29/18 0655  WBC 3.7*   < > 3.5* 4.8  NEUTROABS 2.6  --   --  3.8  HGB 7.8*   < > 8.0* 9.2*  HCT 24.9*   < > 25.2* 28.8*  MCV 101.2*   < > 102.9* 101.1*  PLT 97*   < > 98* 153   < > = values in this interval not displayed.

## 2018-01-29 NOTE — Progress Notes (Signed)
PROGRESS NOTE    Kirk [name]  OEU:[id number] DOB: 03-17-1960 DOA: 01/27/2018 PCP: Gayland Curry, DO   Brief Narrative: Kirk [name] is a 58 y.o. male with a history of hypertension, hyperlipidemia, asthma, GERD, gout, anxiety, anemia, ESRD on hemodialysis, OSA on CPAP, tobacco abuse.  Patient presented secondary to shortness of breath and rectal bleeding.   Assessment & Plan:   Principal Problem:   Acute respiratory failure with hypoxia (HCC) Active Problems:   GIB (gastrointestinal bleeding)   OSA on CPAP   HTN (hypertension)   Anemia in chronic renal disease   Pancytopenia (HCC)   ESRD on dialysis (HCC)   Chest pain   Hypokalemia   Cigarette smoker   Elevated troponin   Acute diastolic heart failure Pulmonary vascular congestion on chest x-ray. Symptoms improving with HD. Transthoracic Echocardiogram significant for a normal EF with grade 2 diastolic dysfunction. Patient reports symptoms consistent with orthopnea. -HD per nephrology  Possible COPD exacerbation Started on treatment -Continue Solumedrol, dulera, duonebs  Chest pain CTA negative for PE. Associated mild troponin elevation. Cardiology consulted. -Cardiology recommendations: if no WMA or decreased EF, no inpatient workup  ESRD on HD -HD per nephrology  Tobacco abuse Patient counseled prior this hospitalization.  Hemorrhoids Diet recommendations made for patient. Patient sees outpatient GI for history of rectal bleeding with a recent colonoscopy significant for precancerous polyps. Hemoglobin stable.   DVT prophylaxis: SCDs Code Status:   Code Status: Full Code Family Communication: None at bedside Disposition Plan: Discharge likely in 1-2 days if closer to respiratory baseeline   Consultants:   Nephrology  Cardiology  Procedures:   HD  Antimicrobials:  Azithromycin    Subjective: Some mild shortness of breath. Worse when lying down flat.   Objective: Vitals:   01/29/18  1244 01/29/18 1300 01/29/18 1955 01/29/18 2010  BP: (!) 176/81  (!) 177/79   Pulse: 72  80   Resp: 20  18   Temp: 98.6 F (37 C)  98 F (36.7 C)   TempSrc: Oral  Oral   SpO2: 99% 98% 98% 97%  Weight:      Height:        Intake/Output Summary (Last 24 hours) at 01/29/2018 2034 Last data filed at 01/29/2018 2000 Gross per 24 hour  Intake 480 ml  Output -  Net 480 ml   Filed Weights   01/28/18 1305 01/28/18 1725 01/29/18 0434  Weight: 98.4 kg 96 kg 95.3 kg    Examination:  General exam: Appears calm and comfortable Respiratory system: Wheezing on exam. Respiratory effort normal. Cardiovascular system: S1 & S2 heard, RRR. No murmurs, rubs, gallops or clicks. Gastrointestinal system: Abdomen is nondistended, soft and nontender. No organomegaly or masses felt. Normal bowel sounds heard. Central nervous system: Alert and oriented. No focal neurological deficits. Extremities: No calf tenderness Skin: No cyanosis. No rashes Psychiatry: Judgement and insight appear normal. Mood & affect appropriate.     Data Reviewed: I have personally reviewed following labs and imaging studies  CBC: Recent Labs  Lab 01/27/18 2100 01/28/18 0127 01/28/18 0732 01/29/18 0655  WBC 3.7* 4.0 3.5* 4.8  NEUTROABS 2.6  --   --  3.8  HGB 7.8* 8.1* 8.0* 9.2*  HCT 24.9* 25.8* 25.2* 28.8*  MCV 101.2* 102.0* 102.9* 101.1*  PLT 97* 93* 98* 154   Basic Metabolic Panel: Recent Labs  Lab 01/27/18 2100 01/28/18 0618 01/29/18 0655  NA 139 139 138  K 3.3* 3.8 4.6  CL 99 98 99  CO2 29 28 30   GLUCOSE 146* 101* 128*  BUN 18 23* 20  CREATININE 8.45* 10.07* 7.98*  CALCIUM 8.7* 8.9 9.8  MG  --   --  2.1   GFR: Estimated Creatinine Clearance: 11.4 mL/min (A) (by C-G formula based on SCr of 7.98 mg/dL (H)). Liver Function Tests: No results for input(s): AST, ALT, ALKPHOS, BILITOT, PROT, ALBUMIN in the last 168 hours. No results for input(s): LIPASE, AMYLASE in the last 168 hours. No results for  input(s): AMMONIA in the last 168 hours. Coagulation Profile: Recent Labs  Lab 01/28/18 0127  INR 1.12   Cardiac Enzymes: Recent Labs  Lab 01/28/18 0127 01/28/18 0618  TROPONINI 0.03* 0.09*   BNP (last 3 results) No results for input(s): PROBNP in the last 8760 hours. HbA1C: Recent Labs    01/28/18 0242  HGBA1C 5.1   CBG: No results for input(s): GLUCAP in the last 168 hours. Lipid Profile: Recent Labs    01/28/18 0242  CHOL 106  HDL 23*  LDLCALC 51  TRIG 160*  CHOLHDL 4.6   Thyroid Function Tests: Recent Labs    01/28/18 0242  TSH 0.787   Anemia Panel: Recent Labs    01/28/18 0127  VITAMINB12 296  FOLATE 30.8  FERRITIN 755*  TIBC 197*  IRON 31*  RETICCTPCT 1.5   Sepsis Labs: No results for input(s): PROCALCITON, LATICACIDVEN in the last 168 hours.  Recent Results (from the past 240 hour(s))  Expectorated sputum assessment w rflx to resp cult     Status: None   Collection Time: 01/28/18  9:11 AM  Result Value Ref Range Status   Specimen Description SPUTUM  Final   Special Requests NONE  Final   Sputum evaluation   Final    THIS SPECIMEN IS ACCEPTABLE FOR SPUTUM CULTURE Performed at [facility], [address] [place], [place] [address]    Report Status 01/28/2018 FINAL  Final  Culture, respiratory     Status: None   Collection Time: 01/28/18  9:11 AM  Result Value Ref Range Status   Specimen Description SPUTUM  Final   Special Requests NONE Reflexed from [id number]  Final   Gram Stain   Final    ABUNDANT WBC PRESENT, PREDOMINANTLY PMN ABUNDANT GRAM NEGATIVE RODS    Culture   Final    ABUNDANT MORAXELLA CATARRHALIS(BRANHAMELLA) BETA LACTAMASE POSITIVE Performed at [facility], [place] [address], [place],  [address]    Report Status 01/29/2018 FINAL  Final         Radiology Studies: Dg Chest 2 View  Result Date: 01/27/2018 CLINICAL DATA:  Productive cough.  Shortness of breath. EXAM: CHEST - 2 VIEW COMPARISON:   Radiographs of January 24, 2018. FINDINGS: Stable cardiomegaly with central pulmonary vascular congestion. No pneumothorax or pleural effusion is noted. No consolidative process is noted. Bony thorax is unremarkable. IMPRESSION: Stable cardiomegaly with central pulmonary vascular congestion. Electronically Signed   By: Marijo Conception, M.D.   On: 01/27/2018 21:39   Ct Angio Chest Pe W Or Wo Contrast  Result Date: 01/28/2018 CLINICAL DATA:  PE suspected, intermediate probability, positive D-dimer. EXAM: CT ANGIOGRAPHY CHEST WITH CONTRAST TECHNIQUE: Multidetector CT imaging of the chest was performed using the standard protocol during bolus administration of intravenous contrast. Multiplanar CT image reconstructions and MIPs were obtained to evaluate the vascular anatomy. CONTRAST:  58mL ISOVUE-370 IOPAMIDOL (ISOVUE-370) INJECTION 76% COMPARISON:  05/30/2017 FINDINGS: Cardiovascular: Satisfactory opacification of the pulmonary arteries to the segmental level.  No evidence of pulmonary embolism. Borderline heart size. No pericardial effusion. Aortic and coronary atherosclerosis. Mediastinum/Nodes: Negative for adenopathy or mass. Lungs/Pleura: Generalized airway thickening. Clustered micro nodules in the lower lungs. Mosaic attenuation in the lower lungs from air trapping. No edema, effusion, or pneumothorax Upper Abdomen: Negative Musculoskeletal: Spondylosis.  No acute finding Review of the MIP images confirms the above findings. IMPRESSION: 1. Bronchitis with small airway impaction versus infectious bronchiolitis in the lower lobes. 2. Negative for pulmonary embolism. Electronically Signed   By: Monte Fantasia M.D.   On: 01/28/2018 05:20   Vas [place] Lower Extremity Venous (dvt)  Result Date: 01/29/2018  Lower Venous Study Indications: D dimer.  Performing Technologist: June Leap RDMS, RVT  Examination Guidelines: A complete evaluation includes B-mode imaging, spectral Doppler, color Doppler, and power Doppler as  needed of all accessible portions of each vessel. Bilateral testing is considered an integral part of a complete examination. Limited examinations for reoccurring indications may be performed as noted.  Right Venous Findings: +---------+---------------+---------+-----------+----------+-------+          CompressibilityPhasicitySpontaneityPropertiesSummary +---------+---------------+---------+-----------+----------+-------+ CFV      Full           Yes      Yes                          +---------+---------------+---------+-----------+----------+-------+ SFJ      Full                                                 +---------+---------------+---------+-----------+----------+-------+ FV Prox  Full                                                 +---------+---------------+---------+-----------+----------+-------+ FV Mid   Full                                                 +---------+---------------+---------+-----------+----------+-------+ FV DistalFull                                                 +---------+---------------+---------+-----------+----------+-------+ PFV      Full                                                 +---------+---------------+---------+-----------+----------+-------+ POP      Full           Yes      Yes                          +---------+---------------+---------+-----------+----------+-------+ PTV      Full                                                 +---------+---------------+---------+-----------+----------+-------+  PERO     Full                                                 +---------+---------------+---------+-----------+----------+-------+  Left Venous Findings: +---------+---------------+---------+-----------+----------+-------+          CompressibilityPhasicitySpontaneityPropertiesSummary +---------+---------------+---------+-----------+----------+-------+ CFV      Full           Yes      Yes                           +---------+---------------+---------+-----------+----------+-------+ SFJ      Full                                                 +---------+---------------+---------+-----------+----------+-------+ FV Prox  Full                                                 +---------+---------------+---------+-----------+----------+-------+ FV Mid   Full                                                 +---------+---------------+---------+-----------+----------+-------+ FV DistalFull                                                 +---------+---------------+---------+-----------+----------+-------+ PFV      Full                                                 +---------+---------------+---------+-----------+----------+-------+ POP      Full           Yes      Yes                          +---------+---------------+---------+-----------+----------+-------+ PTV      Full                                                 +---------+---------------+---------+-----------+----------+-------+ PERO     Full                                                 +---------+---------------+---------+-----------+----------+-------+    Summary: Right: There is no evidence of deep vein thrombosis in the lower extremity. No cystic structure found in the popliteal fossa. Left: There is no evidence of deep vein thrombosis in the lower extremity. No cystic structure found in the popliteal fossa.  *  See table(s) above for measurements and observations. Electronically signed by Harold Barban MD on 01/29/2018 at 6:01:49 PM.    Final         Scheduled Meds: . acidophilus  1 capsule Oral Daily  . azithromycin  250 mg Oral Daily  . calcitRIOL  0.25 mcg Oral Q M,W,F-HD  . Chlorhexidine Gluconate Cloth  6 each Topical Q0600  . cholecalciferol  5,000 Units Oral Daily  . hydrALAZINE  100 mg Oral Daily  . levalbuterol  1.25 mg Nebulization TID  . methylPREDNISolone  (SOLU-MEDROL) injection  60 mg Intravenous Q8H  . metoprolol tartrate  75 mg Oral BID  . mometasone-formoterol  2 puff Inhalation BID  . multivitamin  1 tablet Oral QHS  . nicotine  21 mg Transdermal Daily  . pantoprazole  40 mg Oral BID  . pravastatin  40 mg Oral Daily  . sucroferric oxyhydroxide  1,500 mg Oral TID WC   Continuous Infusions:   LOS: 1 day     Cordelia Poche, MD [facility] 01/29/2018, 8:34 PM  If 7PM-7AM, please contact night-coverage [email or web address]

## 2018-01-29 NOTE — Progress Notes (Signed)
Hemoglobin improved overnight. Mild troponin elevation - echo pending. Further invasive work-up only if echo shows decreased LV function or regional WMA's. Will follow-up after echo. Ok to eat today.  [name], MD, [facility], [place] Director of the [facility]  [facility] Diplomate of the American Board of Clinical Lipidology Attending Cardiologist  Direct Dial: [phone]  Fax: [phone]  Website:  [email or web address]

## 2018-01-29 NOTE — Progress Notes (Signed)
LE venous duplex       has been completed. Preliminary results can be found under CV proc through chart review. Rochella Benner, BS, RDMS, RVT   

## 2018-01-29 NOTE — Plan of Care (Signed)

## 2018-01-30 ENCOUNTER — Ambulatory Visit: Payer: Medicare Other | Admitting: Internal Medicine

## 2018-01-30 DIAGNOSIS — I208 Other forms of angina pectoris: Secondary | ICD-10-CM

## 2018-01-30 DIAGNOSIS — I10 Essential (primary) hypertension: Secondary | ICD-10-CM

## 2018-01-30 MED ORDER — LEVALBUTEROL HCL 1.25 MG/0.5ML IN NEBU: 1.2500 mg | INHALATION_SOLUTION | Freq: Two times a day (BID) | RESPIRATORY_TRACT | Status: DC

## 2018-01-30 MED ORDER — AZITHROMYCIN 250 MG PO TABS: 250.0000 mg | ORAL_TABLET | Freq: Every day | ORAL | 0 refills | Status: AC

## 2018-01-30 MED ORDER — CHLORHEXIDINE GLUCONATE CLOTH 2 % EX PADS: 6.0000 | MEDICATED_PAD | Freq: Every day | CUTANEOUS | Status: DC

## 2018-01-30 MED ORDER — GUAIFENESIN ER 600 MG PO TB12
1200.0000 mg | ORAL_TABLET | Freq: Two times a day (BID) | ORAL | Status: DC
  Administered 2018-01-30: 1200 mg via ORAL

## 2018-01-30 MED ORDER — PREDNISONE 20 MG PO TABS: 40.0000 mg | ORAL_TABLET | Freq: Every day | ORAL | Status: DC

## 2018-01-30 MED ORDER — GUAIFENESIN ER 600 MG PO TB12: 1200.0000 mg | ORAL_TABLET | Freq: Two times a day (BID) | ORAL | 0 refills | Status: AC

## 2018-01-30 MED ORDER — PREDNISONE 20 MG PO TABS: 40.0000 mg | ORAL_TABLET | Freq: Every day | ORAL | 0 refills | Status: AC

## 2018-01-30 MED ORDER — ASPIRIN 81 MG PO CHEW
81.0000 mg | CHEWABLE_TABLET | Freq: Every day | ORAL | Status: DC
  Administered 2018-01-30: 81 mg via ORAL
  Filled 2018-01-30: qty 1

## 2018-01-30 NOTE — Progress Notes (Signed)
Chart and office note reviewed in detail  > agree with a/p as outlined    

## 2018-01-30 NOTE — Discharge Summary (Signed)
Physician Discharge Summary  Kirk [name]:[id number] DOB: Jun 19, 1960 DOA: 01/27/2018  PCP: Gayland Curry, DO  Admit date: 01/27/2018 Discharge date: 01/30/2018  Admitted From: Home Disposition: Home  Recommendations for Outpatient Follow-up:  1. Follow up with PCP in 1 week 2. Follow up with cardiology and pulmonology 3. Please obtain BMP/CBC in one week 4. Please follow up on the following pending results: None  Home Health: None Equipment/Devices: None  Discharge Condition: Stable CODE STATUS: Full code Diet recommendation: Renal diet   Brief/Interim Summary:  Admission HPI written by Ivor Costa, MD   Chief Complaint: SOB, chest pain, rectal bleeding  HPI: Kirk [name] is a 58 y.o. 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  He has cough with little yellow or greenish colored sputum production.  He also reports chills, no fever.  He reports intermittent chest pain, which is located in the front chest, sometimes 10 out of 10 severity, currently chest pain-free, no tenderness in the calf areas.  Patient cannot characterize further if his chest pain is pleuritic or exertional.  He reports headache in the frontal head.  No unilateral weakness, numbness or tingling his extremities.  No facial droop or slurred speech.  Patient states that he has intermittent rectal bleeding with bright red-colored blood recently. He was seen by his GI, Dr. Ardis Hughs in 12/2017 and was told to take fiber supplements. He has nausea and vomiting sometimes, currently no abdominal pain or diarrhea.  He denies symptoms of UTI. Pt states that he was seen by his pulmonologist  on Friday, and had a negative chest x-ray.  ED Course: pt was found to have positive troponin 0.03, positive d-dimer at 0.76, potassium 3.3, bicarbonate 29, creatinine 8.45, BUN 18, hemoglobin 7.8 (11.7 on 06/28/2017, 9.0 on 06/05/2017), BMP 51.2, temperature normal, no tachycardia, oxygen saturation 88-->100% on RA, chest x-ray with cardiomegaly and vascular congestion.  Patient is placed on telemetry bed of observation.    Hospital course:  Acute diastolic heart failure Pulmonary vascular congestion on chest x-ray. Transthoracic Echocardiogram significant for a normal EF with grade 2 diastolic dysfunction. Patient reports symptoms consistent with orthopnea. Improved with HD. Counseled on proper diet and information given prior to discharge.  Asthma exacerbation Patient without a history of COPD, but rather asthma. Significant wheezing requiring IV steroids and breathing treatments. Patient's wheezing and exercise tolerance improved with treatment. Also given a course of azithromycin. Continue 5 day total course as an outpatient. Continue home inhalers.  Chest pain CTA negative for PE. Associated mild troponin elevation. Cardiology consulted. And recommended no inpatient workup since Transthoracic Echocardiogram not significant for wall motion abnormalities or decreased EF.  ESRD on HD HD per nephrology  Tobacco abuse Patient counseled prior this hospitalization.  Hemorrhoids Diet recommendations made for patient. Patient sees outpatient GI for history of rectal bleeding with a recent colonoscopy significant for precancerous polyps. Hemoglobin stable.  Acute on chronic diastolic heart failure Resolved with HD. Follow-up outpatient with cardiology.  Discharge Diagnoses:  Principal Problem:   Acute respiratory failure with hypoxia (HCC) Active Problems:   GIB (gastrointestinal bleeding)   OSA on CPAP   HTN (hypertension)   Anemia in chronic renal disease   Pancytopenia ([place])  ESRD on dialysis [facility])   Chest pain   Hypokalemia   Cigarette smoker   Elevated troponin    Discharge Instructions  Discharge Instructions    Call MD for:  difficulty breathing, headache or visual disturbances   Complete by:  As directed    Increase activity slowly   Complete by:  As directed      Allergies as of 01/30/2018      Reactions   Codeine Nausea And Vomiting      Medication List    TAKE these medications   acetaminophen 500 MG tablet Commonly known as:  TYLENOL Take 1 tablet (500 mg total) by mouth every 6 (six) hours as needed for moderate pain.   albuterol 108 (90 Base) MCG/ACT inhaler Commonly known as:  PROVENTIL HFA;VENTOLIN HFA Inhale 2 puffs into the lungs every 6 (six) hours as needed for wheezing or shortness of breath.   aspirin EC 81 MG tablet Take 81 mg by mouth daily.   azithromycin 250 MG tablet Commonly known as:  ZITHROMAX Take 1 tablet (250 mg total) by mouth daily for 2 days. Start taking on:  January 31, 2018   b complex-vitamin c-folic acid 0.8 MG Tabs tablet TAKE 1 TABLET BY MOUTH EVERY DAY   BENADRYL 25 MG tablet Generic drug:  diphenhydrAMINE Take 25 mg by mouth at bedtime as needed for allergies.   budesonide-formoterol 160-4.5 MCG/ACT inhaler Commonly known as:  SYMBICORT Take 2 puffs first thing in am and then another 2 puffs about 12 hours later.   calcitRIOL 0.25 MCG capsule Commonly known as:  ROCALTROL Take 1 capsule (0.25 mcg total) by mouth every Monday, Wednesday, and Friday with hemodialysis.   clonazePAM 0.5 MG tablet Commonly known as:  KLONOPIN TAKE 1 TABLET (0.5 MG TOTAL) BY MOUTH DAILY AS NEEDED FOR ANXIETY.   guaiFENesin 600 MG 12 hr tablet Commonly known as:  MUCINEX Take 2 tablets (1,200 mg total) by mouth 2 (two) times daily for 5 days.   guaiFENesin-dextromethorphan 100-10 MG/5ML syrup Commonly known as:  ROBITUSSIN DM Take 10 mLs by mouth every 4 (four) hours as needed for cough.   hydrALAZINE 50  MG tablet Commonly known as:  APRESOLINE Take 100 mg by mouth daily.   metoprolol tartrate 50 MG tablet Commonly known as:  LOPRESSOR Take 1.5 tablets (75 mg total) by mouth 2 (two) times daily. Take One and a half tablet by mouth twice daily   omeprazole 20 MG capsule Commonly known as:  PRILOSEC TAKE 1 CAPSULE BY MOUTH 2 TIMES DAILY BEFORE A MEAL. What changed:    how much to take  how to take this  when to take this  additional instructions   ondansetron 4 MG disintegrating tablet Commonly known as:  ZOFRAN ODT Take 1 tablet (4 mg total) by mouth every 8 (eight) hours as needed for nausea or vomiting.   pravastatin 40 MG tablet Commonly known as:  PRAVACHOL TAKE 1 TABLET EVERY EVENING What changed:  when to take this   predniSONE 20 MG tablet Commonly known as:  DELTASONE Take 2 tablets (40 mg total) by mouth daily with breakfast for 2 days. Start taking on:  January 31, 2018   PROBIOTIC-10 PO Take 1 capsule by mouth daily.   traMADol 50 MG tablet Commonly known as:  ULTRAM One every 4 hours as needed for cough What changed:    how much to take  how to take this  when to take this  reasons to take  this  additional instructions   UNABLE TO FIND CPAP: During all periods of rest (naps and at bedtime)   VELPHORO 500 MG chewable tablet Generic drug:  sucroferric oxyhydroxide Chew 500-1,500 mg by mouth See admin instructions. Take 1500 mg with meals and 500 mg with snacks   Vitamin D3 125 MCG (5000 UT) Tabs Take 1 tablet by mouth daily.      Follow-up Information    Reed, Tiffany L, DO. Go on 02/03/2018.   Specialty:  Geriatric Medicine Why:  @2pm  Contact information: [address] [place] [address] [phone]        Tanda Rockers, MD. Schedule an appointment as soon as possible for a visit.   Specialty:  Pulmonary Disease Contact information: [place] [address] [phone]          Allergies    Allergen Reactions  . Codeine Nausea And Vomiting    Consultations:  Cardiology  Nephrology   Procedures/Studies: Dg Chest 2 View  Result Date: 01/27/2018 CLINICAL DATA:  Productive cough.  Shortness of breath. EXAM: CHEST - 2 VIEW COMPARISON:  Radiographs of January 24, 2018. FINDINGS: Stable cardiomegaly with central pulmonary vascular congestion. No pneumothorax or pleural effusion is noted. No consolidative process is noted. Bony thorax is unremarkable. IMPRESSION: Stable cardiomegaly with central pulmonary vascular congestion. Electronically Signed   By: Marijo Conception, M.D.   On: 01/27/2018 21:39   Dg Chest 2 View  Result Date: 01/24/2018 CLINICAL DATA:  Cough, congestion, hypertension, hemodialysis patient EXAM: CHEST - 2 VIEW COMPARISON:  09/30/2017 FINDINGS: Increased cardiomegaly with diffuse vascular and interstitial prominence throughout the mid and lower lungs suggesting mild volume overload or early edema. No definite focal pneumonia, collapse or consolidation. No effusion or pneumothorax. Trachea is midline. Lower cervical fusion hardware noted. Remote cholecystectomy. IMPRESSION: Cardiomegaly with mild volume overload versus early edema pattern. Electronically Signed   By: Jerilynn Mages.  Shick M.D.   On: 01/24/2018 15:24   Ct Angio Chest Pe W Or Wo Contrast  Result Date: 01/28/2018 CLINICAL DATA:  PE suspected, intermediate probability, positive D-dimer. EXAM: CT ANGIOGRAPHY CHEST WITH CONTRAST TECHNIQUE: Multidetector CT imaging of the chest was performed using the standard protocol during bolus administration of intravenous contrast. Multiplanar CT image reconstructions and MIPs were obtained to evaluate the vascular anatomy. CONTRAST:  42mL ISOVUE-370 IOPAMIDOL (ISOVUE-370) INJECTION 76% COMPARISON:  05/30/2017 FINDINGS: Cardiovascular: Satisfactory opacification of the pulmonary arteries to the segmental level. No evidence of pulmonary embolism. Borderline heart size. No pericardial  effusion. Aortic and coronary atherosclerosis. Mediastinum/Nodes: Negative for adenopathy or mass. Lungs/Pleura: Generalized airway thickening. Clustered micro nodules in the lower lungs. Mosaic attenuation in the lower lungs from air trapping. No edema, effusion, or pneumothorax Upper Abdomen: Negative Musculoskeletal: Spondylosis.  No acute finding Review of the MIP images confirms the above findings. IMPRESSION: 1. Bronchitis with small airway impaction versus infectious bronchiolitis in the lower lobes. 2. Negative for pulmonary embolism. Electronically Signed   By: Monte Fantasia M.D.   On: 01/28/2018 05:20   Vas [place] Lower Extremity Venous (dvt)  Result Date: 01/29/2018  Lower Venous Study Indications: D dimer.  Performing Technologist: June Leap RDMS, RVT  Examination Guidelines: A complete evaluation includes B-mode imaging, spectral Doppler, color Doppler, and power Doppler as needed of all accessible portions of each vessel. Bilateral testing is considered an integral part of a complete examination. Limited examinations for reoccurring indications may be performed as noted.  Right Venous Findings: +---------+---------------+---------+-----------+----------+-------+  CompressibilityPhasicitySpontaneityPropertiesSummary +---------+---------------+---------+-----------+----------+-------+ CFV      Full           Yes      Yes                          +---------+---------------+---------+-----------+----------+-------+ SFJ      Full                                                 +---------+---------------+---------+-----------+----------+-------+ FV Prox  Full                                                 +---------+---------------+---------+-----------+----------+-------+ FV Mid   Full                                                 +---------+---------------+---------+-----------+----------+-------+ FV DistalFull                                                  +---------+---------------+---------+-----------+----------+-------+ PFV      Full                                                 +---------+---------------+---------+-----------+----------+-------+ POP      Full           Yes      Yes                          +---------+---------------+---------+-----------+----------+-------+ PTV      Full                                                 +---------+---------------+---------+-----------+----------+-------+ PERO     Full                                                 +---------+---------------+---------+-----------+----------+-------+  Left Venous Findings: +---------+---------------+---------+-----------+----------+-------+          CompressibilityPhasicitySpontaneityPropertiesSummary +---------+---------------+---------+-----------+----------+-------+ CFV      Full           Yes      Yes                          +---------+---------------+---------+-----------+----------+-------+ SFJ      Full                                                 +---------+---------------+---------+-----------+----------+-------+  FV Prox  Full                                                 +---------+---------------+---------+-----------+----------+-------+ FV Mid   Full                                                 +---------+---------------+---------+-----------+----------+-------+ FV DistalFull                                                 +---------+---------------+---------+-----------+----------+-------+ PFV      Full                                                 +---------+---------------+---------+-----------+----------+-------+ POP      Full           Yes      Yes                          +---------+---------------+---------+-----------+----------+-------+ PTV      Full                                                  +---------+---------------+---------+-----------+----------+-------+ PERO     Full                                                 +---------+---------------+---------+-----------+----------+-------+    Summary: Right: There is no evidence of deep vein thrombosis in the lower extremity. No cystic structure found in the popliteal fossa. Left: There is no evidence of deep vein thrombosis in the lower extremity. No cystic structure found in the popliteal fossa.  *See table(s) above for measurements and observations. Electronically signed by Harold Barban MD on 01/29/2018 at 6:01:49 PM.    Final      Transthoracic Echocardiogram (01/29/18) Study Conclusions  - Left ventricle: The cavity size was mildly dilated. Wall   thickness was normal. The estimated ejection fraction was 55%.   Wall motion was normal; there were no regional wall motion   abnormalities. Features are consistent with a pseudonormal left   ventricular filling pattern, with concomitant abnormal relaxation   and increased filling pressure (grade 2 diastolic dysfunction). - Aortic valve: There was mild stenosis. Mean gradient (S): 11 mm   Hg. - Mitral valve: There was trivial regurgitation. - Left atrium: The atrium was moderately dilated. - Right ventricle: The cavity size was normal. Systolic function   was normal. - Right atrium: The atrium was mildly dilated. - Pulmonary arteries: No complete TR doppler jet so unable to   estimate PA systolic pressure. - Inferior vena cava: The vessel was normal in size. The  respirophasic diameter changes were in the normal range (>= 50%),   consistent with normal central venous pressure.  Impressions:  - Mildly dilated LV with EF 55%. Moderate diastolic dysfunction.   Normal RV size and systolic function. Mild aortic stenosis.   Biatrial enlargement.   Subjective: Breathing is better. Coughing up phlegm.  Discharge Exam: Vitals:   01/30/18 1430 01/30/18 1441  BP: (!)  152/76 (!) 145/88  Pulse: 94 77  Resp: 20 (!) 23  Temp:  98.1 F (36.7 C)  SpO2:  96%   Vitals:   01/30/18 1300 01/30/18 1400 01/30/18 1430 01/30/18 1441  BP: (!) (P) 158/62 (!) 171/58 (!) 152/76 (!) 145/88  Pulse: 80 78 94 77  Resp: (!) 24 (!) 23 20 (!) 23  Temp:    98.1 F (36.7 C)  TempSrc:    Oral  SpO2:    96%  Weight:    97.4 kg  Height:        General: Pt is alert, awake, not in acute distress Cardiovascular: RRR, S1/S2 +, no rubs, no gallops Respiratory: CTA bilaterally, no wheezing, no rhonchi Abdominal: Soft, NT, ND, bowel sounds + Extremities: no edema, no cyanosis    The results of significant diagnostics from this hospitalization (including imaging, microbiology, ancillary and laboratory) are listed below for reference.     Microbiology: Recent Results (from the past 240 hour(s))  Expectorated sputum assessment w rflx to resp cult     Status: None   Collection Time: 01/28/18  9:11 AM  Result Value Ref Range Status   Specimen Description SPUTUM  Final   Special Requests NONE  Final   Sputum evaluation   Final    THIS SPECIMEN IS ACCEPTABLE FOR SPUTUM CULTURE Performed at [facility], [address] [place], [place] [address]    Report Status 01/28/2018 FINAL  Final  Culture, respiratory     Status: None   Collection Time: 01/28/18  9:11 AM  Result Value Ref Range Status   Specimen Description SPUTUM  Final   Special Requests NONE Reflexed from [id number]  Final   Gram Stain   Final    ABUNDANT WBC PRESENT, PREDOMINANTLY PMN ABUNDANT GRAM NEGATIVE RODS    Culture   Final    ABUNDANT MORAXELLA CATARRHALIS(BRANHAMELLA) BETA LACTAMASE POSITIVE Performed at [facility], [place] [address], [place], [place] [address]    Report Status 01/29/2018 FINAL  Final     Labs: BNP (last 3 results) Recent Labs    01/27/18 2100  BNP 902.4*   Basic Metabolic Panel: Recent Labs  Lab 01/27/18 2100 01/28/18 0618 01/29/18 0655  NA 139 139 138  K  3.3* 3.8 4.6  CL 99 98 99  CO2 29 28 30   GLUCOSE 146* 101* 128*  BUN 18 23* 20  CREATININE 8.45* 10.07* 7.98*  CALCIUM 8.7* 8.9 9.8  MG  --   --  2.1   Liver Function Tests: No results for input(s): AST, ALT, ALKPHOS, BILITOT, PROT, ALBUMIN in the last 168 hours. No results for input(s): LIPASE, AMYLASE in the last 168 hours. No results for input(s): AMMONIA in the last 168 hours. CBC: Recent Labs  Lab 01/27/18 2100 01/28/18 0127 01/28/18 0732 01/29/18 0655  WBC 3.7* 4.0 3.5* 4.8  NEUTROABS 2.6  --   --  3.8  HGB 7.8* 8.1* 8.0* 9.2*  HCT 24.9* 25.8* 25.2* 28.8*  MCV 101.2* 102.0* 102.9* 101.1*  PLT 97* 93* 98* 153   Cardiac Enzymes: Recent  Labs  Lab 01/28/18 0127 01/28/18 0618  TROPONINI 0.03* 0.09*   BNP: Invalid input(s): POCBNP CBG: No results for input(s): GLUCAP in the last 168 hours. D-Dimer Recent Labs    01/28/18 0242  DDIMER 0.76*   Hgb A1c Recent Labs    01/28/18 0242  HGBA1C 5.1   Lipid Profile Recent Labs    01/28/18 0242  CHOL 106  HDL 23*  LDLCALC 51  TRIG 160*  CHOLHDL 4.6   Thyroid function studies Recent Labs    01/28/18 0242  TSH 0.787   Anemia work up Recent Labs    01/28/18 0127  VITAMINB12 296  FOLATE 30.8  FERRITIN 755*  TIBC 197*  IRON 31*  RETICCTPCT 1.5   Urinalysis    Component Value Date/Time   COLORURINE YELLOW 02/19/2017 1520   APPEARANCEUR HAZY (A) 02/19/2017 1520   LABSPEC 1.012 02/19/2017 1520   PHURINE 8.0 02/19/2017 1520   GLUCOSEU 50 (A) 02/19/2017 1520   HGBUR MODERATE (A) 02/19/2017 1520   BILIRUBINUR NEGATIVE 02/19/2017 1520   KETONESUR NEGATIVE 02/19/2017 1520   PROTEINUR >=300 (A) 02/19/2017 1520   UROBILINOGEN 0.2 04/06/2014 1030   NITRITE NEGATIVE 02/19/2017 1520   LEUKOCYTESUR MODERATE (A) 02/19/2017 1520   Sepsis Labs Invalid input(s): PROCALCITONIN,  WBC,  LACTICIDVEN Microbiology Recent Results (from the past 240 hour(s))  Expectorated sputum assessment w rflx to resp cult      Status: None   Collection Time: 01/28/18  9:11 AM  Result Value Ref Range Status   Specimen Description SPUTUM  Final   Special Requests NONE  Final   Sputum evaluation   Final    THIS SPECIMEN IS ACCEPTABLE FOR SPUTUM CULTURE Performed at [facility], [place] [address], [place], [place] [address]    Report Status 01/28/2018 FINAL  Final  Culture, respiratory     Status: None   Collection Time: 01/28/18  9:11 AM  Result Value Ref Range Status   Specimen Description SPUTUM  Final   Special Requests NONE Reflexed from [id number]  Final   Gram Stain   Final    ABUNDANT WBC PRESENT, PREDOMINANTLY PMN ABUNDANT GRAM NEGATIVE RODS    Culture   Final    ABUNDANT MORAXELLA CATARRHALIS(BRANHAMELLA) BETA LACTAMASE POSITIVE Performed at [facility], [place] [address] [place], [place] [address]    Report Status 01/29/2018 FINAL  Final     SIGNED:   Cordelia Poche, MD [facility] 01/30/2018, 5:09 PM

## 2018-01-30 NOTE — Progress Notes (Signed)
[facility] Progress Note  Subjective: didn't get HD yest due to our mistake  Vitals:   01/30/18 0331 01/30/18 0445 01/30/18 0856 01/30/18 0946  BP:  (!) 168/73  (!) 181/82  Pulse:  74    Resp:  18  20  Temp:  98.6 F (37 C)  98 F (36.7 C)  TempSrc:  Oral  Oral  SpO2:  99% 95% 99%  Weight: 98.8 kg     Height:        Inpatient medications: . acidophilus  1 capsule Oral Daily  . aspirin  81 mg Oral Daily  . azithromycin  250 mg Oral Daily  . calcitRIOL  0.25 mcg Oral Q M,W,F-HD  . Chlorhexidine Gluconate Cloth  6 each Topical Q0600  . cholecalciferol  5,000 Units Oral Daily  . guaiFENesin  1,200 mg Oral BID  . hydrALAZINE  100 mg Oral Daily  . levalbuterol  1.25 mg Nebulization BID  . metoprolol tartrate  75 mg Oral BID  . mometasone-formoterol  2 puff Inhalation BID  . multivitamin  1 tablet Oral QHS  . nicotine  21 mg Transdermal Daily  . pantoprazole  40 mg Oral BID  . pravastatin  40 mg Oral Daily  . [START ON 01/31/2018] predniSONE  40 mg Oral Q breakfast  . sucroferric oxyhydroxide  1,500 mg Oral TID WC    acetaminophen **OR** acetaminophen, clonazePAM, diphenhydrAMINE, hydrALAZINE, hydrOXYzine, levalbuterol, morphine injection, nitroGLYCERIN, sucroferric oxyhydroxide, traMADol, zolpidem  Iron/TIBC/Ferritin/ %Sat    Component Value Date/Time   IRON 31 (L) 01/28/2018 0127   TIBC 197 (L) 01/28/2018 0127   FERRITIN 755 (H) 01/28/2018 0127   IRONPCTSAT 16 (L) 01/28/2018 0127    Exam: Gen on bipap, calm No jvd or bruits Chest clear bilat, no rales RRR no MRG Abd soft ntnd no mass or ascites +bs Ext no LE or UE edema Neuro is alert, Ox 3 , nf LFA AVF+bruit    Home meds:  - aspirin 81/ pravastatin 40hs  - sucroferric oxyhydroxide 1500 ac tid/ calcitriol w hd 0.25 ug  - metoprolol 75 bid/ hydralazine 100 qd  - tramadol qid 50mg  prn/ clonazepam 0.5 bid prn  - budesonide-formoterol 2 bid  - omeprazole 40 qd  - prns/  vitamins   Dialysis: MWF    4h 68min   450/A2   98.5kg  2/2 bath  Hep 4000  LFA AVF  - V50 weekly  - M150 every 2 wks  - calcitriol 1.0 tiw     Assessment: 1. Resp failure/ pulm edema - better, has lost sig body wt 2. Vol overload 3. ESRD on HD MWF 4. Chest pain /^troponin 5. HTN 6. MBD ckd 7. Anemia ckd  P: 1. Plan 3L off on HD this afternoon then can be dc'd today and will get OP HD tomorrow/ Friday. Needs dry wt lowered at least 4 kg.   Kelly Splinter MD [employer] pager [phone]   01/30/2018, 12:53 PM   Recent Labs  Lab 01/28/18 0127 01/28/18 0618 01/29/18 0655  NA  --  139 138  K  --  3.8 4.6  CL  --  98 99  CO2  --  28 30  GLUCOSE  --  101* 128*  BUN  --  23* 20  CREATININE  --  10.07* 7.98*  CALCIUM  --  8.9 9.8  INR 1.12  --   --    No results for input(s): AST, ALT, ALKPHOS, BILITOT, PROT in the last  168 hours. Recent Labs  Lab 01/27/18 2100  01/28/18 0732 01/29/18 0655  WBC 3.7*   < > 3.5* 4.8  NEUTROABS 2.6  --   --  3.8  HGB 7.8*   < > 8.0* 9.2*  HCT 24.9*   < > 25.2* 28.8*  MCV 101.2*   < > 102.9* 101.1*  PLT 97*   < > 98* 153   < > = values in this interval not displayed.

## 2018-01-30 NOTE — Progress Notes (Signed)
Progress Note  Patient Name: Kirk [name] Date of Encounter: 01/30/2018  Primary Cardiologist:  [name], MD  Subjective   Breathing better now, CP is better, admits he sometimes puts on a lot of fluid between HD appts.  Occ has palpitations, no presyncope or syncope.   Per wife, pt HR up to 160 when in ER, was given metoprolol 5 mg IV 01/07 early am.   Inpatient Medications    Scheduled Meds: . acidophilus  1 capsule Oral Daily  . azithromycin  250 mg Oral Daily  . calcitRIOL  0.25 mcg Oral Q M,W,F-HD  . Chlorhexidine Gluconate Cloth  6 each Topical Q0600  . cholecalciferol  5,000 Units Oral Daily  . guaiFENesin  1,200 mg Oral BID  . hydrALAZINE  100 mg Oral Daily  . levalbuterol  1.25 mg Nebulization TID  . metoprolol tartrate  75 mg Oral BID  . mometasone-formoterol  2 puff Inhalation BID  . multivitamin  1 tablet Oral QHS  . nicotine  21 mg Transdermal Daily  . pantoprazole  40 mg Oral BID  . pravastatin  40 mg Oral Daily  . [START ON 01/31/2018] predniSONE  40 mg Oral Q breakfast  . sucroferric oxyhydroxide  1,500 mg Oral TID WC   Continuous Infusions:  PRN Meds: acetaminophen **OR** acetaminophen, clonazePAM, diphenhydrAMINE, hydrALAZINE, hydrOXYzine, levalbuterol, morphine injection, nitroGLYCERIN, sucroferric oxyhydroxide, traMADol, zolpidem   Vital Signs    Vitals:   01/30/18 0331 01/30/18 0445 01/30/18 0856 01/30/18 0946  BP:  (!) 168/73  (!) 181/82  Pulse:  74    Resp:  18  20  Temp:  98.6 F (37 C)  98 F (36.7 C)  TempSrc:  Oral  Oral  SpO2:  99% 95% 99%  Weight: 98.8 kg     Height:        Intake/Output Summary (Last 24 hours) at 01/30/2018 1049 Last data filed at 01/30/2018 0432 Gross per 24 hour  Intake 480 ml  Output -  Net 480 ml   Filed Weights   01/28/18 1725 01/29/18 0434 01/30/18 0331  Weight: 96 kg 95.3 kg 98.8 kg    Telemetry    SR, sinus brady - Personally Reviewed  ECG    None today - Personally  Reviewed  Physical Exam   General: Well developed, well nourished, male appearing in no acute distress. Head: Normocephalic, atraumatic.  Neck: Supple without bruits, JVD not seen elevated, difficult to assess 2nd body habitus and beart. Lungs:  Resp regular and unlabored, rales and rhonchi noted, slight wheeze. Heart: RRR, S1, S2, no S3, S4, soft murmur; no rub. Abdomen: Soft, non-tender, non-distended with normoactive bowel sounds. No hepatomegaly. No rebound/guarding. No obvious abdominal masses. Extremities: No clubbing, cyanosis, no edema. Distal pedal pulses are 2+ bilaterally. Neuro: Alert and oriented X 3. Moves all extremities spontaneously. Psych: Normal affect.  Labs    Hematology Recent Labs  Lab 01/28/18 0127 01/28/18 0732 01/29/18 0655  WBC 4.0 3.5* 4.8  RBC 2.53*  2.53* 2.45* 2.85*  HGB 8.1* 8.0* 9.2*  HCT 25.8* 25.2* 28.8*  MCV 102.0* 102.9* 101.1*  MCH 32.0 32.7 32.3  MCHC 31.4 31.7 31.9  RDW 14.4 14.4 13.9  PLT 93* 98* 153    Chemistry Recent Labs  Lab 01/27/18 2100 01/28/18 0618 01/29/18 0655  NA 139 139 138  K 3.3* 3.8 4.6  CL 99 98 99  CO2 29 28 30   GLUCOSE 146* 101* 128*  BUN 18 23* 20  CREATININE  8.45* 10.07* 7.98*  CALCIUM 8.7* 8.9 9.8  GFRNONAA 6* 5* 7*  GFRAA 7* 6* 8*  ANIONGAP 11 13 9      Cardiac Enzymes Recent Labs  Lab 01/28/18 0127 01/28/18 0618  TROPONINI 0.03* 0.09*    Recent Labs  Lab 01/27/18 2118  TROPIPOC 0.03     BNP Recent Labs  Lab 01/27/18 2100  BNP 951.2*     DDimer  Recent Labs  Lab 01/28/18 0242  DDIMER 0.76*     Radiology    Dg Chest 2 View  Result Date: 01/27/2018 CLINICAL DATA:  Productive cough.  Shortness of breath. EXAM: CHEST - 2 VIEW COMPARISON:  Radiographs of January 24, 2018. FINDINGS: Stable cardiomegaly with central pulmonary vascular congestion. No pneumothorax or pleural effusion is noted. No consolidative process is noted. Bony thorax is unremarkable. IMPRESSION: Stable  cardiomegaly with central pulmonary vascular congestion. Electronically Signed   By: Marijo Conception, M.D.   On: 01/27/2018 21:39   Ct Angio Chest Pe W Or Wo Contrast  Result Date: 01/28/2018 CLINICAL DATA:  PE suspected, intermediate probability, positive D-dimer. EXAM: CT ANGIOGRAPHY CHEST WITH CONTRAST TECHNIQUE: Multidetector CT imaging of the chest was performed using the standard protocol during bolus administration of intravenous contrast. Multiplanar CT image reconstructions and MIPs were obtained to evaluate the vascular anatomy. CONTRAST:  33mL ISOVUE-370 IOPAMIDOL (ISOVUE-370) INJECTION 76% COMPARISON:  05/30/2017 FINDINGS: Cardiovascular: Satisfactory opacification of the pulmonary arteries to the segmental level. No evidence of pulmonary embolism. Borderline heart size. No pericardial effusion. Aortic and coronary atherosclerosis. Mediastinum/Nodes: Negative for adenopathy or mass. Lungs/Pleura: Generalized airway thickening. Clustered micro nodules in the lower lungs. Mosaic attenuation in the lower lungs from air trapping. No edema, effusion, or pneumothorax Upper Abdomen: Negative Musculoskeletal: Spondylosis.  No acute finding Review of the MIP images confirms the above findings. IMPRESSION: 1. Bronchitis with small airway impaction versus infectious bronchiolitis in the lower lobes. 2. Negative for pulmonary embolism. Electronically Signed   By: Monte Fantasia M.D.   On: 01/28/2018 05:20   Vas [place] Lower Extremity Venous (dvt)  Result Date: 01/29/2018  Lower Venous Study Indications: D dimer.  Performing Technologist: June Leap RDMS, RVT  Examination Guidelines: A complete evaluation includes B-mode imaging, spectral Doppler, color Doppler, and power Doppler as needed of all accessible portions of each vessel. Bilateral testing is considered an integral part of a complete examination. Limited examinations for reoccurring indications may be performed as noted.  Right Venous Findings:  +---------+---------------+---------+-----------+----------+-------+          CompressibilityPhasicitySpontaneityPropertiesSummary +---------+---------------+---------+-----------+----------+-------+ CFV      Full           Yes      Yes                          +---------+---------------+---------+-----------+----------+-------+ SFJ      Full                                                 +---------+---------------+---------+-----------+----------+-------+ FV Prox  Full                                                 +---------+---------------+---------+-----------+----------+-------+ FV Mid  Full                                                 +---------+---------------+---------+-----------+----------+-------+ FV DistalFull                                                 +---------+---------------+---------+-----------+----------+-------+ PFV      Full                                                 +---------+---------------+---------+-----------+----------+-------+ POP      Full           Yes      Yes                          +---------+---------------+---------+-----------+----------+-------+ PTV      Full                                                 +---------+---------------+---------+-----------+----------+-------+ PERO     Full                                                 +---------+---------------+---------+-----------+----------+-------+  Left Venous Findings: +---------+---------------+---------+-----------+----------+-------+          CompressibilityPhasicitySpontaneityPropertiesSummary +---------+---------------+---------+-----------+----------+-------+ CFV      Full           Yes      Yes                          +---------+---------------+---------+-----------+----------+-------+ SFJ      Full                                                  +---------+---------------+---------+-----------+----------+-------+ FV Prox  Full                                                 +---------+---------------+---------+-----------+----------+-------+ FV Mid   Full                                                 +---------+---------------+---------+-----------+----------+-------+ FV DistalFull                                                 +---------+---------------+---------+-----------+----------+-------+ PFV  Full                                                 +---------+---------------+---------+-----------+----------+-------+ POP      Full           Yes      Yes                          +---------+---------------+---------+-----------+----------+-------+ PTV      Full                                                 +---------+---------------+---------+-----------+----------+-------+ PERO     Full                                                 +---------+---------------+---------+-----------+----------+-------+    Summary: Right: There is no evidence of deep vein thrombosis in the lower extremity. No cystic structure found in the popliteal fossa. Left: There is no evidence of deep vein thrombosis in the lower extremity. No cystic structure found in the popliteal fossa.  *See table(s) above for measurements and observations. Electronically signed by Harold Barban MD on 01/29/2018 at 6:01:49 PM.    Final      Cardiac Studies   ECHO:  01/29/2018 - Left ventricle: The cavity size was mildly dilated. Wall   thickness was normal. The estimated ejection fraction was 55%.   Wall motion was normal; there were no regional wall motion   abnormalities. Features are consistent with a pseudonormal left   ventricular filling pattern, with concomitant abnormal relaxation   and increased filling pressure (grade 2 diastolic dysfunction). - Aortic valve: There was mild stenosis. Mean gradient (S): 11 mm   Hg. -  Mitral valve: There was trivial regurgitation. - Left atrium: The atrium was moderately dilated. - Right ventricle: The cavity size was normal. Systolic function   was normal. - Right atrium: The atrium was mildly dilated. - Pulmonary arteries: No complete TR doppler jet so unable to   estimate PA systolic pressure. - Inferior vena cava: The vessel was normal in size. The   respirophasic diameter changes were in the normal range (>= 50%),   consistent with normal central venous pressure.  Impressions:  - Mildly dilated LV with EF 55%. Moderate diastolic dysfunction.   Normal RV size and systolic function. Mild aortic stenosis.   Biatrial enlargement.  LE DOPPLERS: 01/29/2018 Summary: Right: There is no evidence of deep vein thrombosis in the lower extremity. No cystic structure found in the popliteal fossa. Left: There is no evidence of deep vein thrombosis in the lower extremity. No cystic structure found in the popliteal fossa.   Patient Profile     59 y.o. male w/ hx ESRD on HD, HTN, HLD, OSA on CPAP, gout, anemia w/ intermittent rectal bleeding, tob use, asthma, no sig CAD 2013 cath, was admitted 01/06 for SOB, CP, rectal bleeding. Cards saw 01/07, echo ordered.  Assessment & Plan    1. Chest pain:  - minimal elevation in troponin not consistent w/ ACS  -  echo results above, EF is normal w/ no WMA - No ischemic eval planned, rx non-cardiac pain  2. PAF:  - seen on ECG 01/07, rx in ER w/ IV BB and converted - now maintaining SR, sinus brady - PE study neg - CHA2DS2-VASc = 1 (HTN)  - Anticoag not indicated, MD advise on ASA - no sx from bradycardia, continue BB at current dose.   3. Acute on chronic diastolic CHF:  - Volume mgt w/ HD, per IM/Nephrology - compliance w/ low Na diet and fluid restrictions encouraged  Otherwise, per IM Principal Problem:   Acute respiratory failure with hypoxia (HCC) Active Problems:   GIB (gastrointestinal bleeding)   OSA on CPAP    HTN (hypertension)   Anemia in chronic renal disease   Pancytopenia ([place])   ESRD on dialysis ([place])   Chest pain   Hypokalemia   Cigarette smoker   Elevated troponin    Jonetta Speak , PA-C 10:49 AM 01/30/2018 Pager: [phone]

## 2018-01-30 NOTE — Discharge Instructions (Signed)
Kirk [name],  You were admitted with shortness of breath related to too much fluid and an asthma exacerbation. You had dialysis, antibiotics and steroids. Please follow-up with your primary care physician and pulmonologist.

## 2018-01-31 ENCOUNTER — Telehealth: Payer: Self-pay | Admitting: *Deleted

## 2018-01-31 DIAGNOSIS — D509 Iron deficiency anemia, unspecified: Secondary | ICD-10-CM | POA: Diagnosis not present

## 2018-01-31 DIAGNOSIS — E8779 Other fluid overload: Secondary | ICD-10-CM | POA: Diagnosis not present

## 2018-01-31 DIAGNOSIS — N2581 Secondary hyperparathyroidism of renal origin: Secondary | ICD-10-CM | POA: Diagnosis not present

## 2018-01-31 DIAGNOSIS — D631 Anemia in chronic kidney disease: Secondary | ICD-10-CM | POA: Diagnosis not present

## 2018-01-31 DIAGNOSIS — N186 End stage renal disease: Secondary | ICD-10-CM | POA: Diagnosis not present

## 2018-01-31 LAB — TYPE AND SCREEN
ABO/RH(D): A POS
Antibody Screen: NEGATIVE
Unit division: 0

## 2018-01-31 LAB — BPAM RBC
BLOOD PRODUCT EXPIRATION DATE: 202001312359
Unit Type and Rh: 6200

## 2018-01-31 NOTE — Telephone Encounter (Signed)
Great, hope he keeps it.  Thanks!

## 2018-01-31 NOTE — Consult Note (Signed)
   [facility] CM Inpatient Consult   01/31/2018  GLYNDON TURSI 07/15/1960 [id number]  Late entry: 01/30/2018 11 am  Met with the patient at the bedside to discuss [facility] as in network follow up.  Patient accepted a brochure and 24 hour nurse advise line.  Denies any needs,  He states he is in dialysis on MWF and doesn't feel he needs a nurse to follow up. Denies any current issues with for resources or pharmacy.  For questions,   Natividad Brood, RN BSN [facility] Liaison  [phone] business mobile phone Toll free office [phone]

## 2018-01-31 NOTE — Telephone Encounter (Signed)
TOC call placed  [facility] for patient to return call. Will try again later.

## 2018-01-31 NOTE — Telephone Encounter (Signed)
TOC Call Placed   [facility] for patient to Return call. 2nd attempt made for TOC.   Patient has a Scheduled appointment for Monday 02/03/18 @ 2:00pm

## 2018-02-03 ENCOUNTER — Ambulatory Visit (INDEPENDENT_AMBULATORY_CARE_PROVIDER_SITE_OTHER): Payer: Medicare Other | Admitting: Internal Medicine

## 2018-02-03 ENCOUNTER — Encounter: Payer: Self-pay | Admitting: Internal Medicine

## 2018-02-03 VITALS — BP 172/80 | HR 72 | Temp 97.9°F | Ht 66.0 in | Wt 222.0 lb

## 2018-02-03 DIAGNOSIS — Q8781 Alport syndrome: Secondary | ICD-10-CM

## 2018-02-03 DIAGNOSIS — I10 Essential (primary) hypertension: Secondary | ICD-10-CM | POA: Diagnosis not present

## 2018-02-03 DIAGNOSIS — J4540 Moderate persistent asthma, uncomplicated: Secondary | ICD-10-CM

## 2018-02-03 DIAGNOSIS — N186 End stage renal disease: Secondary | ICD-10-CM

## 2018-02-03 DIAGNOSIS — F419 Anxiety disorder, unspecified: Secondary | ICD-10-CM | POA: Diagnosis not present

## 2018-02-03 DIAGNOSIS — Z992 Dependence on renal dialysis: Secondary | ICD-10-CM

## 2018-02-03 DIAGNOSIS — E1121 Type 2 diabetes mellitus with diabetic nephropathy: Secondary | ICD-10-CM

## 2018-02-03 DIAGNOSIS — N2581 Secondary hyperparathyroidism of renal origin: Secondary | ICD-10-CM | POA: Diagnosis not present

## 2018-02-03 DIAGNOSIS — D509 Iron deficiency anemia, unspecified: Secondary | ICD-10-CM | POA: Diagnosis not present

## 2018-02-03 DIAGNOSIS — D631 Anemia in chronic kidney disease: Secondary | ICD-10-CM | POA: Diagnosis not present

## 2018-02-03 DIAGNOSIS — E8779 Other fluid overload: Secondary | ICD-10-CM | POA: Diagnosis not present

## 2018-02-03 MED ORDER — CLONAZEPAM 0.5 MG PO TABS: 0.5000 mg | ORAL_TABLET | Freq: Two times a day (BID) | ORAL | 0 refills | Status: DC | PRN

## 2018-02-03 NOTE — Progress Notes (Signed)
Location:  [facility] clinic Provider: Brant Peets L. Mariea Clonts, D.O., C.M.D.  Code Status: full code Goals of Care:  Advanced Directives 01/29/2018  Does Patient Have a Medical Advance Directive? No  Does patient want to make changes to medical advance directive? -  Would patient like information on creating a medical advance directive? No - Patient declined  Pre-existing out of facility DNR order (yellow form or pink MOST form) -     Chief Complaint  Patient presents with  . Transitions Of Care    discharged 01/30/2018    HPI: Patient is a 58 y.o. male seen today for hospital follow-up s/p admission from 1/3-01/27/18 with shortness of breath, chest pain and rectal bleeding.  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    During his hospitalization, he had an echo with grade 2 diastolic dysfunction, pulmonary vascular congestion seen on CXR.  He also had an asthma exacerbation and was treated with IV steroids and nebs plus a zpak for 5 day course.  Home inhalers were continued.  He had a CTA negative for PE.  Cardiology was consulted for elevated troponin.  F/u outpatient was recommended after no wall motion abnormalities or decreased EF were seen on echo.  His rectal bleeding was attributed to hemorrhoids and he follows with outpatient GI, Dr. Iva Lento recent cscope negative for precancerous polyps.  Hydralazine 100mg  11am and 11pm.    Anxiety med:  Clonazepam.  His wife reports his emotions are all over--he attributes this to stress about his care at dialysis--reports poor hygiene of staff there and concerns about his health being affected.  He also has concerns about his bp control.  He was using it as needed.  He says it's hard to sleep at night.  Of course, we have no information from dialysis as is often the case.  His  wife has requested them to send his latest labs, weights and vitals.  By end of day, nothing was received.  He's had difficulty with bleeding at his access during HD sessions lately.  He has had blood all over him if he wakes up from a nap.  He also describes a vast array of concerns about patient safety at the [place] [facility] site.    He's to have a cbc, bmp and f/u with cardiology and pulmonary.    Nephrology has adjust his hydralazine to 100mg  bid from 50mg .  He also needs a refill on his clonazepam.    Past Medical History:  Diagnosis Date  . Acute edema of lung, unspecified   . Acute, but ill-defined, cerebrovascular disease   . Allergy   . Anemia   . 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)   . Anxiety   . Asthma   . Carpal tunnel syndrome   . Cellulitis and abscess of trunk   . Cholelithiasis 07/13/2014  . Chronic headaches   . Debility, unspecified   . Dermatophytosis of the body   . Dysrhythmia    history of  . Edema   . End stage renal disease on dialysis [facility])    "MWF; [facility] in [facility]" (10/21/2014)  . Essential hypertension, benign   . GERD (gastroesophageal reflux disease)   . Gout, unspecified   . HTN (hypertension)   . Hypertrophy of prostate without urinary obstruction and other lower urinary tract symptoms (LUTS)   .  Hypotension, unspecified   . Impotence of organic origin   . Insomnia, unspecified   . Kidney replaced by transplant   . Localization-related (focal) (partial) epilepsy and epileptic syndromes with complex partial seizures, without mention of intractable epilepsy   . Lumbago   . Memory loss   . OSA on CPAP   . Other and unspecified hyperlipidemia   . Other chronic nonalcoholic liver disease   . Other malaise and fatigue   . Other nonspecific abnormal serum enzyme levels   . Pain in joint, lower leg   . Pain in joint, upper arm   . Pneumonia "several times"  . Renal dialysis status(V45.11) 02/05/2010   restarted 01/02/13 ofter  renal trransplant failure  . Secondary hyperparathyroidism (of renal origin)   . Shortness of breath   . Sleep apnea   . Tension headache   . Unspecified constipation   . Unspecified essential hypertension   . Unspecified hereditary and idiopathic peripheral neuropathy   . Unspecified vitamin D deficiency     Past Surgical History:  Procedure Laterality Date  . AV FISTULA PLACEMENT Left ?2010   "forearm; at [facility]"  . BACK SURGERY    . CARDIAC CATHETERIZATION  03/21/2011  . CHOLECYSTECTOMY N/A 10/21/2014   Procedure: LAPAROSCOPIC CHOLECYSTECTOMY WITH INTRAOPERATIVE CHOLANGIOGRAM;  Surgeon: Autumn Messing III, MD;  Location: [place];  Service: General;  Laterality: N/A;  . COLONOSCOPY    . 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 KIDNEY TRANSPLANT  08/17/2011   [place]  10/21/2014   w/IOC  . LEFT HEART CATHETERIZATION WITH CORONARY ANGIOGRAM N/A 03/21/2011   Procedure: LEFT HEART CATHETERIZATION WITH CORONARY ANGIOGRAM;  Surgeon: [name], MD;  Location: [facility] CATH LAB;  Service: Cardiovascular;  Laterality: N/A;  . NEPHRECTOMY  08/2013   removed transplaned kidney  . POSTERIOR FUSION CERVICAL SPINE  06/25/2012   for spinal stenosis  . VASECTOMY  2010    Allergies  Allergen Reactions  . Codeine Nausea And Vomiting    Outpatient Encounter Medications as of 02/03/2018  Medication Sig  . acetaminophen (TYLENOL) 500 MG tablet Take 1 tablet (500 mg total) by mouth every 6 (six) hours as needed for moderate pain.  Marland Kitchen albuterol (PROVENTIL HFA;VENTOLIN HFA) 108 (90 Base) MCG/ACT inhaler Inhale 2 puffs into the lungs every 6 (six) hours as needed for wheezing or shortness of breath.  Marland Kitchen aspirin EC 81 MG tablet Take 81 mg by mouth daily.  Marland Kitchen b complex-vitamin c-folic acid (NEPHRO-VITE) 0.8 MG TABS tablet TAKE 1 TABLET BY MOUTH EVERY DAY  . budesonide-formoterol (SYMBICORT) 160-4.5 MCG/ACT inhaler Take 2 puffs first thing in am  and then another 2 puffs about 12 hours later.  . calcitRIOL (ROCALTROL) 0.25 MCG capsule Take 1 capsule (0.25 mcg total) by mouth every Monday, Wednesday, and Friday with hemodialysis.  . Cholecalciferol (VITAMIN D3) 5000 UNITS TABS Take 1 tablet by mouth daily.  . clonazePAM (KLONOPIN) 0.5 MG tablet TAKE 1 TABLET (0.5 MG TOTAL) BY MOUTH DAILY AS NEEDED FOR ANXIETY.  . diphenhydrAMINE (BENADRYL) 25 MG tablet Take 25 mg by mouth at bedtime as needed for allergies.  Marland Kitchen guaiFENesin (MUCINEX) 600 MG 12 hr tablet Take 2 tablets (1,200 mg total) by mouth 2 (two) times daily for 5 days.  Marland Kitchen guaiFENesin-dextromethorphan (ROBITUSSIN DM) 100-10 MG/5ML syrup Take 10 mLs by mouth every 4 (four) hours as needed for cough.  . hydrALAZINE (APRESOLINE) 50 MG tablet Take 100  mg by mouth daily.  . metoprolol tartrate (LOPRESSOR) 50 MG tablet Take 1.5 tablets (75 mg total) by mouth 2 (two) times daily. Take One and a half tablet by mouth twice daily  . omeprazole (PRILOSEC) 20 MG capsule TAKE 1 CAPSULE BY MOUTH 2 TIMES DAILY BEFORE A MEAL.  Marland Kitchen ondansetron (ZOFRAN ODT) 4 MG disintegrating tablet Take 1 tablet (4 mg total) by mouth every 8 (eight) hours as needed for nausea or vomiting.  . pravastatin (PRAVACHOL) 40 MG tablet TAKE 1 TABLET EVERY EVENING  . Probiotic Product (PROBIOTIC-10 PO) Take 1 capsule by mouth daily.   . sucroferric oxyhydroxide (VELPHORO) 500 MG chewable tablet Chew 500-1,500 mg by mouth See admin instructions. Take 1500 mg with meals and 500 mg with snacks  . traMADol (ULTRAM) 50 MG tablet One every 4 hours as needed for cough (Patient taking differently: Take 50 mg by mouth every 6 (six) hours as needed for moderate pain (cough). )  . [DISCONTINUED] UNABLE TO FIND CPAP: During all periods of rest (naps and at bedtime)   No facility-administered encounter medications on file as of 02/03/2018.     Review of Systems:  Review of Systems  Constitutional: Positive for malaise/fatigue. Negative for  chills and fever.  HENT: Positive for hearing loss. Negative for congestion and sore throat.   Eyes:       Glasses  Respiratory: Positive for cough. Negative for sputum production, shortness of breath and wheezing.        Wheezing and dyspnea are better  Cardiovascular: Negative for chest pain, palpitations and leg swelling.       Reports his legs don't swell, it's his hands and face/neck when he is volume overloaded  Gastrointestinal: Negative for abdominal pain and diarrhea.  Genitourinary: Negative for dysuria.  Musculoskeletal: Negative for falls and joint pain.  Skin: Negative for itching and rash.  Neurological: Negative for dizziness and loss of consciousness.  Psychiatric/Behavioral: Negative for depression and memory loss. The patient is nervous/anxious. The patient does not have insomnia.     Health Maintenance  Topic Date Due  . OPHTHALMOLOGY EXAM  01/29/1972  . FOOT EXAM  04/29/2014  . TETANUS/TDAP  01/23/2016  . INFLUENZA VACCINE  05/20/2018 (Originally 08/22/2017)  . HEMOGLOBIN A1C  07/29/2018  . COLONOSCOPY  08/30/2019  . PNEUMOCOCCAL POLYSACCHARIDE VACCINE AGE 46-64 HIGH RISK  Completed  . Hepatitis C Screening  Completed  . HIV Screening  Completed    Physical Exam: Vitals:   02/03/18 1409  BP: (!) 172/80  Pulse: 72  Temp: 97.9 F (36.6 C)  TempSrc: Oral  SpO2: 97%  Weight: 222 lb (100.7 kg)  Height: 5\' 6"  (1.676 m)   Body mass index is 35.83 kg/m. Physical Exam Constitutional:      General: He is not in acute distress.    Appearance: Normal appearance. He is obese.  Eyes:     Comments: glasses  Cardiovascular:     Rate and Rhythm: Normal rate and regular rhythm.     Pulses: Normal pulses.     Comments: Left arm HD access with compression dressings in place Pulmonary:     Effort: Pulmonary effort is normal.     Breath sounds: No wheezing, rhonchi or rales.  Abdominal:     General: Bowel sounds are normal.  Musculoskeletal: Normal range of  motion.  Skin:    General: Skin is warm and dry.  Neurological:     General: No focal deficit present.     Mental  Status: He is alert and oriented to person, place, and time.  Psychiatric:        Mood and Affect: Mood normal.     Labs reviewed: Basic Metabolic Panel: Recent Labs    05/01/17 0455  05/31/17 0313  01/27/18 2100 01/28/18 0242 01/28/18 0618 01/29/18 0655  NA 140   < > 138   < > 139  --  139 138  K 3.4*   < > 4.3   < > 3.3*  --  3.8 4.6  CL 98*   < > 99*   < > 99  --  98 99  CO2 23   < > 26   < > 29  --  28 30  GLUCOSE 126*   < > 109*   < > 146*  --  101* 128*  BUN 56*   < > 25*   < > 18  --  23* 20  CREATININE 13.83*   < > 8.51*   < > 8.45*  --  10.07* 7.98*  CALCIUM 8.8*   < > 8.6*   < > 8.7*  --  8.9 9.8  MG  --   --   --   --   --   --   --  2.1  PHOS 9.0*  --  5.3*  --   --   --   --   --   TSH  --   --   --   --   --  0.787  --   --    < > = values in this interval not displayed.   Liver Function Tests: Recent Labs    04/30/17 0449  05/30/17 1448 05/31/17 0313 06/28/17 1706  AST 28  --  21  --  19  ALT 21  --  22  --  21  ALKPHOS 54  --  55  --  74  BILITOT 0.9  --  0.8  --  0.8  PROT 7.0  --  6.7  --  8.0  ALBUMIN 3.4*   < > 3.5 3.4* 3.9   < > = values in this interval not displayed.   Recent Labs    02/19/17 1341 06/28/17 1706  LIPASE 43 94*   No results for input(s): AMMONIA in the last 8760 hours. CBC: Recent Labs    06/05/17 0346  01/27/18 2100 01/28/18 0127 01/28/18 0732 01/29/18 0655  WBC 8.7   < > 3.7* 4.0 3.5* 4.8  NEUTROABS 6.4  --  2.6  --   --  3.8  HGB 9.0*   < > 7.8* 8.1* 8.0* 9.2*  HCT 26.7*   < > 24.9* 25.8* 25.2* 28.8*  MCV 94.3   < > 101.2* 102.0* 102.9* 101.1*  PLT 103*   < > 97* 93* 98* 153   < > = values in this interval not displayed.   Lipid Panel: Recent Labs    01/28/18 0242  CHOL 106  HDL 23*  LDLCALC 51  TRIG 160*  CHOLHDL 4.6   Lab Results  Component Value Date   HGBA1C 5.1 01/28/2018     Procedures since last visit: Dg Chest 2 View  Result Date: 01/27/2018 CLINICAL DATA:  Productive cough.  Shortness of breath. EXAM: CHEST - 2 VIEW COMPARISON:  Radiographs of January 24, 2018. FINDINGS: Stable cardiomegaly with central pulmonary vascular congestion. No pneumothorax or pleural effusion is noted. No consolidative process is noted. Bony thorax is unremarkable. IMPRESSION: Stable cardiomegaly  with central pulmonary vascular congestion. Electronically Signed   By: Marijo Conception, M.D.   On: 01/27/2018 21:39   Ct Angio Chest Pe W Or Wo Contrast  Result Date: 01/28/2018 CLINICAL DATA:  PE suspected, intermediate probability, positive D-dimer. EXAM: CT ANGIOGRAPHY CHEST WITH CONTRAST TECHNIQUE: Multidetector CT imaging of the chest was performed using the standard protocol during bolus administration of intravenous contrast. Multiplanar CT image reconstructions and MIPs were obtained to evaluate the vascular anatomy. CONTRAST:  71mL ISOVUE-370 IOPAMIDOL (ISOVUE-370) INJECTION 76% COMPARISON:  05/30/2017 FINDINGS: Cardiovascular: Satisfactory opacification of the pulmonary arteries to the segmental level. No evidence of pulmonary embolism. Borderline heart size. No pericardial effusion. Aortic and coronary atherosclerosis. Mediastinum/Nodes: Negative for adenopathy or mass. Lungs/Pleura: Generalized airway thickening. Clustered micro nodules in the lower lungs. Mosaic attenuation in the lower lungs from air trapping. No edema, effusion, or pneumothorax Upper Abdomen: Negative Musculoskeletal: Spondylosis.  No acute finding Review of the MIP images confirms the above findings. IMPRESSION: 1. Bronchitis with small airway impaction versus infectious bronchiolitis in the lower lobes. 2. Negative for pulmonary embolism. Electronically Signed   By: Monte Fantasia M.D.   On: 01/28/2018 05:20    Assessment/Plan 1. Anxiety -worse lately due to his concerns about his dialysis site - clonazePAM  (KLONOPIN) 0.5 MG tablet; Take 1 tablet (0.5 mg total) by mouth 2 (two) times daily as needed for anxiety.  Dispense: 60 tablet; Refill: 0  2. Moderate persistent asthma, unspecified whether complicated - improved, continue same inhaler regimen, avoid e-cigarettes/vapes (denies, but there was an odor I recognized)  3. ESRD on dialysis 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 Type 2 diabetes mellitus with diabetic nephropathy, without long-term current use of insulin (HCC) -has been controlled, but does tend to improve at this stage of renal failure Lab Results  Component Value Date   HGBA1C 5.1 01/28/2018  -encouraged healthy eating  6. Uncontrolled hypertension -bps remain high at times, other times plummet low -we do not have enough information to make adjustments to his bp--he takes his meds after HD and then before bed at night  Labs/tests ordered:   Orders Placed This Encounter  Procedures  . CBC with Differential/Platelet  . Basic metabolic panel    Do not call on call about BUN, creatinine results.  Patient has ESRD.    Order Specific Question:   Has the patient fasted?    Answer:   Yes   Medications were reconciled.  Requested records from dialysis center as above.  Next appt:  07/01/2018  Susan Bleich L. Tosha Belgarde, D.O. [facility] [address], [place] [address] Cell Phone (Mon-Fri 8am-5pm):  [phone] On Call:  [phone] & follow prompts after 5pm & weekends Office Phone:  [phone] Office Fax:  [phone]

## 2018-02-03 NOTE — Patient Instructions (Addendum)
Ok to take B12 1000 mcg daily for energy.    Please be sure to take your regular inhalers as directed.  Avoid smoking and vaping/e-cigarettes which will cause you to get sick.

## 2018-02-04 DIAGNOSIS — D509 Iron deficiency anemia, unspecified: Secondary | ICD-10-CM | POA: Diagnosis not present

## 2018-02-04 DIAGNOSIS — D631 Anemia in chronic kidney disease: Secondary | ICD-10-CM | POA: Diagnosis not present

## 2018-02-04 DIAGNOSIS — E8779 Other fluid overload: Secondary | ICD-10-CM | POA: Diagnosis not present

## 2018-02-04 DIAGNOSIS — N186 End stage renal disease: Secondary | ICD-10-CM | POA: Diagnosis not present

## 2018-02-04 DIAGNOSIS — N2581 Secondary hyperparathyroidism of renal origin: Secondary | ICD-10-CM | POA: Diagnosis not present

## 2018-02-04 LAB — CBC WITH DIFFERENTIAL/PLATELET
Absolute Monocytes: 476 cells/uL (ref 200–950)
Basophils Absolute: 58 cells/uL (ref 0–200)
Basophils Relative: 1 %
Eosinophils Absolute: 52 cells/uL (ref 15–500)
Eosinophils Relative: 0.9 %
HCT: 25.3 % — ABNORMAL LOW (ref 38.5–50.0)
Hemoglobin: 8.5 g/dL — ABNORMAL LOW (ref 13.2–17.1)
Lymphs Abs: 1172 cells/uL (ref 850–3900)
MCH: 32.8 pg (ref 27.0–33.0)
MCHC: 33.6 g/dL (ref 32.0–36.0)
MCV: 97.7 fL (ref 80.0–100.0)
MPV: 12.7 fL — ABNORMAL HIGH (ref 7.5–12.5)
Monocytes Relative: 8.2 %
Neutro Abs: 4043 cells/uL (ref 1500–7800)
Neutrophils Relative %: 69.7 %
Platelets: 131 10*3/uL — ABNORMAL LOW (ref 140–400)
RBC: 2.59 10*6/uL — ABNORMAL LOW (ref 4.20–5.80)
RDW: 13.9 % (ref 11.0–15.0)
Total Lymphocyte: 20.2 %
WBC: 5.8 10*3/uL (ref 3.8–10.8)

## 2018-02-04 LAB — BASIC METABOLIC PANEL
BUN/Creatinine Ratio: 5 (calc) — ABNORMAL LOW (ref 6–22)
BUN: 43 mg/dL — ABNORMAL HIGH (ref 7–25)
CO2: 29 mmol/L (ref 20–32)
Calcium: 8.4 mg/dL — ABNORMAL LOW (ref 8.6–10.3)
Chloride: 99 mmol/L (ref 98–110)
Creat: 8.37 mg/dL — ABNORMAL HIGH (ref 0.70–1.33)
Glucose, Bld: 164 mg/dL — ABNORMAL HIGH (ref 65–139)
Potassium: 4 mmol/L (ref 3.5–5.3)
Sodium: 140 mmol/L (ref 135–146)

## 2018-02-05 DIAGNOSIS — D631 Anemia in chronic kidney disease: Secondary | ICD-10-CM | POA: Diagnosis not present

## 2018-02-05 DIAGNOSIS — D509 Iron deficiency anemia, unspecified: Secondary | ICD-10-CM | POA: Diagnosis not present

## 2018-02-05 DIAGNOSIS — N186 End stage renal disease: Secondary | ICD-10-CM | POA: Diagnosis not present

## 2018-02-05 DIAGNOSIS — N2581 Secondary hyperparathyroidism of renal origin: Secondary | ICD-10-CM | POA: Diagnosis not present

## 2018-02-05 DIAGNOSIS — E8779 Other fluid overload: Secondary | ICD-10-CM | POA: Diagnosis not present

## 2018-02-06 ENCOUNTER — Encounter: Payer: Self-pay | Admitting: *Deleted

## 2018-02-07 DIAGNOSIS — D509 Iron deficiency anemia, unspecified: Secondary | ICD-10-CM | POA: Diagnosis not present

## 2018-02-07 DIAGNOSIS — N2581 Secondary hyperparathyroidism of renal origin: Secondary | ICD-10-CM | POA: Diagnosis not present

## 2018-02-07 DIAGNOSIS — D631 Anemia in chronic kidney disease: Secondary | ICD-10-CM | POA: Diagnosis not present

## 2018-02-07 DIAGNOSIS — N186 End stage renal disease: Secondary | ICD-10-CM | POA: Diagnosis not present

## 2018-02-07 DIAGNOSIS — E8779 Other fluid overload: Secondary | ICD-10-CM | POA: Diagnosis not present

## 2018-02-10 DIAGNOSIS — N2581 Secondary hyperparathyroidism of renal origin: Secondary | ICD-10-CM | POA: Diagnosis not present

## 2018-02-10 DIAGNOSIS — D509 Iron deficiency anemia, unspecified: Secondary | ICD-10-CM | POA: Diagnosis not present

## 2018-02-10 DIAGNOSIS — D631 Anemia in chronic kidney disease: Secondary | ICD-10-CM | POA: Diagnosis not present

## 2018-02-10 DIAGNOSIS — N186 End stage renal disease: Secondary | ICD-10-CM | POA: Diagnosis not present

## 2018-02-10 DIAGNOSIS — E8779 Other fluid overload: Secondary | ICD-10-CM | POA: Diagnosis not present

## 2018-02-12 DIAGNOSIS — D509 Iron deficiency anemia, unspecified: Secondary | ICD-10-CM | POA: Diagnosis not present

## 2018-02-12 DIAGNOSIS — N186 End stage renal disease: Secondary | ICD-10-CM | POA: Diagnosis not present

## 2018-02-12 DIAGNOSIS — D631 Anemia in chronic kidney disease: Secondary | ICD-10-CM | POA: Diagnosis not present

## 2018-02-12 DIAGNOSIS — E8779 Other fluid overload: Secondary | ICD-10-CM | POA: Diagnosis not present

## 2018-02-12 DIAGNOSIS — N2581 Secondary hyperparathyroidism of renal origin: Secondary | ICD-10-CM | POA: Diagnosis not present

## 2018-02-13 DIAGNOSIS — N2581 Secondary hyperparathyroidism of renal origin: Secondary | ICD-10-CM | POA: Diagnosis not present

## 2018-02-13 DIAGNOSIS — D509 Iron deficiency anemia, unspecified: Secondary | ICD-10-CM | POA: Diagnosis not present

## 2018-02-13 DIAGNOSIS — N186 End stage renal disease: Secondary | ICD-10-CM | POA: Diagnosis not present

## 2018-02-13 DIAGNOSIS — D631 Anemia in chronic kidney disease: Secondary | ICD-10-CM | POA: Diagnosis not present

## 2018-02-13 DIAGNOSIS — E8779 Other fluid overload: Secondary | ICD-10-CM | POA: Diagnosis not present

## 2018-02-14 DIAGNOSIS — E8779 Other fluid overload: Secondary | ICD-10-CM | POA: Diagnosis not present

## 2018-02-14 DIAGNOSIS — N186 End stage renal disease: Secondary | ICD-10-CM | POA: Diagnosis not present

## 2018-02-14 DIAGNOSIS — N2581 Secondary hyperparathyroidism of renal origin: Secondary | ICD-10-CM | POA: Diagnosis not present

## 2018-02-14 DIAGNOSIS — D631 Anemia in chronic kidney disease: Secondary | ICD-10-CM | POA: Diagnosis not present

## 2018-02-14 DIAGNOSIS — D509 Iron deficiency anemia, unspecified: Secondary | ICD-10-CM | POA: Diagnosis not present

## 2018-02-17 DIAGNOSIS — E8779 Other fluid overload: Secondary | ICD-10-CM | POA: Diagnosis not present

## 2018-02-17 DIAGNOSIS — N2581 Secondary hyperparathyroidism of renal origin: Secondary | ICD-10-CM | POA: Diagnosis not present

## 2018-02-17 DIAGNOSIS — D509 Iron deficiency anemia, unspecified: Secondary | ICD-10-CM | POA: Diagnosis not present

## 2018-02-17 DIAGNOSIS — D631 Anemia in chronic kidney disease: Secondary | ICD-10-CM | POA: Diagnosis not present

## 2018-02-17 DIAGNOSIS — N186 End stage renal disease: Secondary | ICD-10-CM | POA: Diagnosis not present

## 2018-02-19 DIAGNOSIS — N186 End stage renal disease: Secondary | ICD-10-CM | POA: Diagnosis not present

## 2018-02-19 DIAGNOSIS — D509 Iron deficiency anemia, unspecified: Secondary | ICD-10-CM | POA: Diagnosis not present

## 2018-02-19 DIAGNOSIS — N2581 Secondary hyperparathyroidism of renal origin: Secondary | ICD-10-CM | POA: Diagnosis not present

## 2018-02-19 DIAGNOSIS — E8779 Other fluid overload: Secondary | ICD-10-CM | POA: Diagnosis not present

## 2018-02-19 DIAGNOSIS — D631 Anemia in chronic kidney disease: Secondary | ICD-10-CM | POA: Diagnosis not present

## 2018-02-21 DIAGNOSIS — N2581 Secondary hyperparathyroidism of renal origin: Secondary | ICD-10-CM | POA: Diagnosis not present

## 2018-02-21 DIAGNOSIS — D631 Anemia in chronic kidney disease: Secondary | ICD-10-CM | POA: Diagnosis not present

## 2018-02-21 DIAGNOSIS — N186 End stage renal disease: Secondary | ICD-10-CM | POA: Diagnosis not present

## 2018-02-21 DIAGNOSIS — D509 Iron deficiency anemia, unspecified: Secondary | ICD-10-CM | POA: Diagnosis not present

## 2018-02-21 DIAGNOSIS — E8779 Other fluid overload: Secondary | ICD-10-CM | POA: Diagnosis not present

## 2018-02-22 DIAGNOSIS — T8612 Kidney transplant failure: Secondary | ICD-10-CM | POA: Diagnosis not present

## 2018-02-22 DIAGNOSIS — Z992 Dependence on renal dialysis: Secondary | ICD-10-CM | POA: Diagnosis not present

## 2018-02-22 DIAGNOSIS — N186 End stage renal disease: Secondary | ICD-10-CM | POA: Diagnosis not present

## 2018-02-24 DIAGNOSIS — N2581 Secondary hyperparathyroidism of renal origin: Secondary | ICD-10-CM | POA: Diagnosis not present

## 2018-02-24 DIAGNOSIS — D631 Anemia in chronic kidney disease: Secondary | ICD-10-CM | POA: Diagnosis not present

## 2018-02-24 DIAGNOSIS — N186 End stage renal disease: Secondary | ICD-10-CM | POA: Diagnosis not present

## 2018-02-26 DIAGNOSIS — M2550 Pain in unspecified joint: Secondary | ICD-10-CM | POA: Diagnosis not present

## 2018-02-26 DIAGNOSIS — D631 Anemia in chronic kidney disease: Secondary | ICD-10-CM | POA: Diagnosis not present

## 2018-02-26 DIAGNOSIS — N186 End stage renal disease: Secondary | ICD-10-CM | POA: Diagnosis not present

## 2018-02-26 DIAGNOSIS — N2581 Secondary hyperparathyroidism of renal origin: Secondary | ICD-10-CM | POA: Diagnosis not present

## 2018-02-28 DIAGNOSIS — N186 End stage renal disease: Secondary | ICD-10-CM | POA: Diagnosis not present

## 2018-02-28 DIAGNOSIS — N2581 Secondary hyperparathyroidism of renal origin: Secondary | ICD-10-CM | POA: Diagnosis not present

## 2018-02-28 DIAGNOSIS — D631 Anemia in chronic kidney disease: Secondary | ICD-10-CM | POA: Diagnosis not present

## 2018-03-03 DIAGNOSIS — N186 End stage renal disease: Secondary | ICD-10-CM | POA: Diagnosis not present

## 2018-03-03 DIAGNOSIS — D631 Anemia in chronic kidney disease: Secondary | ICD-10-CM | POA: Diagnosis not present

## 2018-03-03 DIAGNOSIS — N2581 Secondary hyperparathyroidism of renal origin: Secondary | ICD-10-CM | POA: Diagnosis not present

## 2018-03-05 DIAGNOSIS — N186 End stage renal disease: Secondary | ICD-10-CM | POA: Diagnosis not present

## 2018-03-05 DIAGNOSIS — N2581 Secondary hyperparathyroidism of renal origin: Secondary | ICD-10-CM | POA: Diagnosis not present

## 2018-03-05 DIAGNOSIS — D631 Anemia in chronic kidney disease: Secondary | ICD-10-CM | POA: Diagnosis not present

## 2018-03-07 DIAGNOSIS — N186 End stage renal disease: Secondary | ICD-10-CM | POA: Diagnosis not present

## 2018-03-07 DIAGNOSIS — N2581 Secondary hyperparathyroidism of renal origin: Secondary | ICD-10-CM | POA: Diagnosis not present

## 2018-03-07 DIAGNOSIS — D631 Anemia in chronic kidney disease: Secondary | ICD-10-CM | POA: Diagnosis not present

## 2018-03-10 DIAGNOSIS — D631 Anemia in chronic kidney disease: Secondary | ICD-10-CM | POA: Diagnosis not present

## 2018-03-10 DIAGNOSIS — N186 End stage renal disease: Secondary | ICD-10-CM | POA: Diagnosis not present

## 2018-03-10 DIAGNOSIS — N2581 Secondary hyperparathyroidism of renal origin: Secondary | ICD-10-CM | POA: Diagnosis not present

## 2018-03-12 DIAGNOSIS — D631 Anemia in chronic kidney disease: Secondary | ICD-10-CM | POA: Diagnosis not present

## 2018-03-12 DIAGNOSIS — N186 End stage renal disease: Secondary | ICD-10-CM | POA: Diagnosis not present

## 2018-03-12 DIAGNOSIS — N2581 Secondary hyperparathyroidism of renal origin: Secondary | ICD-10-CM | POA: Diagnosis not present

## 2018-03-14 DIAGNOSIS — N2581 Secondary hyperparathyroidism of renal origin: Secondary | ICD-10-CM | POA: Diagnosis not present

## 2018-03-14 DIAGNOSIS — N186 End stage renal disease: Secondary | ICD-10-CM | POA: Diagnosis not present

## 2018-03-14 DIAGNOSIS — D631 Anemia in chronic kidney disease: Secondary | ICD-10-CM | POA: Diagnosis not present

## 2018-03-17 ENCOUNTER — Ambulatory Visit (INDEPENDENT_AMBULATORY_CARE_PROVIDER_SITE_OTHER): Payer: Medicare Other | Admitting: Gastroenterology

## 2018-03-17 ENCOUNTER — Encounter: Payer: Self-pay | Admitting: Gastroenterology

## 2018-03-17 VITALS — BP 140/72 | HR 84 | Ht 67.0 in | Wt 224.0 lb

## 2018-03-17 DIAGNOSIS — D631 Anemia in chronic kidney disease: Secondary | ICD-10-CM | POA: Diagnosis not present

## 2018-03-17 DIAGNOSIS — K625 Hemorrhage of anus and rectum: Secondary | ICD-10-CM | POA: Diagnosis not present

## 2018-03-17 DIAGNOSIS — N186 End stage renal disease: Secondary | ICD-10-CM | POA: Diagnosis not present

## 2018-03-17 DIAGNOSIS — N2581 Secondary hyperparathyroidism of renal origin: Secondary | ICD-10-CM | POA: Diagnosis not present

## 2018-03-17 NOTE — Progress Notes (Signed)
Review of pertinent gastrointestinal problems: 1. Personal history of tubular adenomas, colonoscopy May 2009.Recommended to have repeat colonoscopy at 3 year interval. A reminder letter was sent in 2012. Colonoscopy 06/2011 Dr. Ardis Hughs, two subcentimeter polyps removed, were adenomatous. Recommended recall at 5 year interval.  Colonoscopy August 2018 found 3 polyps, one was 10 mm, all were adenomatous.  Recall at 3-year interval. 2. new anemia prompted EGD October 2009. Mild gastritis, duodenitis H. pylori positive on biopsy. 3. C diff TOXIN + diarrhea: 09/2015   HPI: This is a pleasant 58 year old man who is here with his wife today.  I last saw him 2 months ago for minor intermittent rectal bleeding.  I felt that his chronic renal failure contributed to his anemia.  Since his last colonoscopy was in 2018 I suggested a trial of fiber supplements to see if we can keep him from pushing and straining which is when he generally would see bleeding.  Saw red bleeding rectally 4-5 weeks ago.  This lasted for 2 to 3 days.  He has seen none since then.  CBC done while at hemodialysis on Friday showed a hemoglobin of 10.8 which is very good for him.  His wife tried him on miralax, stools became too soft.  He really has not had to push or strain very much at all lately.  Has been havin normal BMs.  Chief complaint is intermittent rectal bleeding  ROS: complete GI ROS as described in HPI, all other review negative.  Constitutional:  No unintentional weight loss   Past Medical History:  Diagnosis Date  . Acute edema of lung, unspecified   . Acute, but ill-defined, cerebrovascular disease   . Allergy   . Anemia   . 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)   . Anxiety   . Asthma   . Carpal tunnel syndrome   . Cellulitis and abscess of trunk   . Cholelithiasis 07/13/2014  . Chronic headaches   . Debility, unspecified   . Dermatophytosis of the body   . Dysrhythmia    history of  . Edema    . End stage renal disease on dialysis [facility])    "MWF; [facility]" (10/21/2014)  . Essential hypertension, benign   . GERD (gastroesophageal reflux disease)   . Gout, unspecified   . HTN (hypertension)   . Hypertrophy of prostate without urinary obstruction and other lower urinary tract symptoms (LUTS)   . Hypotension, unspecified   . Impotence of organic origin   . Insomnia, unspecified   . Kidney replaced by transplant   . Localization-related (focal) (partial) epilepsy and epileptic syndromes with complex partial seizures, without mention of intractable epilepsy   . Lumbago   . Memory loss   . OSA on CPAP   . Other and unspecified hyperlipidemia   . Other chronic nonalcoholic liver disease   . Other malaise and fatigue   . Other nonspecific abnormal serum enzyme levels   . Pain in joint, lower leg   . Pain in joint, upper arm   . Pneumonia "several times"  . Renal dialysis status(V45.11) 02/05/2010   restarted 01/02/13 ofter renal trransplant failure  . Secondary hyperparathyroidism (of renal origin)   . Shortness of breath   . Sleep apnea   . Tension headache   . Unspecified constipation   . Unspecified essential hypertension   . Unspecified hereditary and idiopathic peripheral neuropathy   . Unspecified vitamin D deficiency     Past Surgical History:  Procedure Laterality Date  .  AV FISTULA PLACEMENT Left ?2010   "forearm; at [facility]"  . BACK SURGERY    . CARDIAC CATHETERIZATION  03/21/2011  . CHOLECYSTECTOMY N/A 10/21/2014   Procedure: LAPAROSCOPIC CHOLECYSTECTOMY WITH INTRAOPERATIVE CHOLANGIOGRAM;  Surgeon: Autumn Messing III, MD;  Location: [place];  Service: General;  Laterality: N/A;  . COLONOSCOPY    . 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 KIDNEY TRANSPLANT  08/17/2011   [place]  10/21/2014   w/IOC  . LEFT HEART CATHETERIZATION WITH CORONARY ANGIOGRAM N/A 03/21/2011   Procedure:  LEFT HEART CATHETERIZATION WITH CORONARY ANGIOGRAM;  Surgeon: [name], MD;  Location: [facility] CATH LAB;  Service: Cardiovascular;  Laterality: N/A;  . NEPHRECTOMY  08/2013   removed transplaned kidney  . POSTERIOR FUSION CERVICAL SPINE  06/25/2012   for spinal stenosis  . VASECTOMY  2010    Current Outpatient Medications  Medication Sig Dispense Refill  . acetaminophen (TYLENOL) 500 MG tablet Take 1 tablet (500 mg total) by mouth every 6 (six) hours as needed for moderate pain.    Marland Kitchen albuterol (PROVENTIL HFA;VENTOLIN HFA) 108 (90 Base) MCG/ACT inhaler Inhale 2 puffs into the lungs every 6 (six) hours as needed for wheezing or shortness of breath. 1 Inhaler 0  . aspirin EC 81 MG tablet Take 81 mg by mouth daily.    Marland Kitchen b complex-vitamin c-folic acid (NEPHRO-VITE) 0.8 MG TABS tablet TAKE 1 TABLET BY MOUTH EVERY DAY 90 tablet 1  . budesonide-formoterol (SYMBICORT) 160-4.5 MCG/ACT inhaler Take 2 puffs first thing in am and then another 2 puffs about 12 hours later. 1 Inhaler 12  . calcitRIOL (ROCALTROL) 0.25 MCG capsule Take 1 capsule (0.25 mcg total) by mouth every Monday, Wednesday, and Friday with hemodialysis. 30 capsule 1  . Cholecalciferol (VITAMIN D3) 5000 UNITS TABS Take 1 tablet by mouth daily.    . clonazePAM (KLONOPIN) 0.5 MG tablet Take 1 tablet (0.5 mg total) by mouth 2 (two) times daily as needed for anxiety. 60 tablet 0  . diphenhydrAMINE (BENADRYL) 25 MG tablet Take 25 mg by mouth at bedtime as needed for allergies.    Marland Kitchen guaiFENesin-dextromethorphan (ROBITUSSIN DM) 100-10 MG/5ML syrup Take 10 mLs by mouth every 4 (four) hours as needed for cough.    . hydrALAZINE (APRESOLINE) 50 MG tablet Take 100 mg by mouth daily.    . metoprolol tartrate (LOPRESSOR) 50 MG tablet Take 1.5 tablets (75 mg total) by mouth 2 (two) times daily. Take One and a half tablet by mouth twice daily 135 tablet 1  . omeprazole (PRILOSEC) 20 MG capsule TAKE 1 CAPSULE BY MOUTH 2 TIMES DAILY BEFORE A MEAL. 180  capsule 3  . ondansetron (ZOFRAN ODT) 4 MG disintegrating tablet Take 1 tablet (4 mg total) by mouth every 8 (eight) hours as needed for nausea or vomiting. 20 tablet 0  . pravastatin (PRAVACHOL) 40 MG tablet TAKE 1 TABLET EVERY EVENING 90 tablet 0  . Probiotic Product (PROBIOTIC-10 PO) Take 1 capsule by mouth daily.     . sucroferric oxyhydroxide (VELPHORO) 500 MG chewable tablet Chew 500-1,500 mg by mouth See admin instructions. Take 1500 mg with meals and 500 mg with snacks    . traMADol (ULTRAM) 50 MG tablet One every 4 hours as needed for cough (Patient taking differently: Take 50 mg by mouth every 6 (six) hours as needed for moderate pain (cough). ) 30 tablet 0   No current facility-administered medications for this visit.  Allergies as of 03/17/2018 - Review Complete 03/17/2018  Allergen Reaction Noted  . Codeine Nausea And Vomiting 05/20/2007    Family History  Adopted: Yes  Problem Relation Age of Onset  . Colon cancer Neg Hx   . Esophageal cancer Neg Hx   . Rectal cancer Neg Hx   . Stomach cancer Neg Hx     Social History   Socioeconomic History  . Marital status: Married    Spouse name: [name]  . Number of children: 3  . Years of education: Not on file  . Highest education level: Not on file  Occupational History  . Occupation: disabled    Employer: DISABLED  Social Needs  . Financial resource strain: Not hard at all  . Food insecurity:    Worry: Never true    Inability: Never true  . Transportation needs:    Medical: No    Non-medical: No  Tobacco Use  . Smoking status: Current Every Day Smoker    Packs/day: 0.50    Years: 32.00    Pack years: 16.00    Types: Cigarettes  . Smokeless tobacco: Never Used  . Tobacco comment: 1 cigarette every couple days  Substance and Sexual Activity  . Alcohol use: No    Alcohol/week: 0.0 standard drinks  . Drug use: No  . Sexual activity: Yes  Lifestyle  . Physical activity:    Days per week: 3 days    Minutes  per session: 20 min  . Stress: Only a little  Relationships  . Social connections:    Talks on phone: More than three times a week    Gets together: More than three times a week    Attends religious service: More than 4 times per year    Active member of club or organization: No    Attends meetings of clubs or organizations: Never    Relationship status: Married  . Intimate partner violence:    Fear of current or ex partner: No    Emotionally abused: No    Physically abused: No    Forced sexual activity: No  Other Topics Concern  . Not on file  Social History Narrative   Drinks 1 cup of caffeine daily.     Physical Exam: BP 140/72   Pulse 84   Ht 5\' 7"  (1.702 m)   Wt 224 lb (101.6 kg)   BMI 35.08 kg/m  Constitutional: Chronically ill-appearing Psychiatric: alert and oriented x3 Abdomen: soft, nontender, nondistended, no obvious ascites, no peritoneal signs, normal bowel sounds No peripheral edema noted in lower extremities  Assessment and plan: 57 y.o. male with intermittent rectal bleeding  Very likely anorectal in origin.  It seems to have been related to pushing and straining to move his bowels in the past.  He tells me he has not had to push and strain very much at all lately.  He did see some bleeding about a month ago.  Hemoglobin on Friday was quite good for him at 10.8.  I recommended we just follow him clinically and he knows to call here if he has significant overt rectal bleeding again.  Please see the "Patient Instructions" section for addition details about the plan.  Owens Loffler, MD [facility] 03/17/2018, 3:52 PM

## 2018-03-17 NOTE — Patient Instructions (Signed)
Normal BMI (Body Mass Index- based on height and weight) is between 19 and 25. Your BMI today is Body mass index is 35.08 kg/m. Marland Kitchen Please consider follow up  regarding your BMI with your Primary Care Provider.  Follow up as needed  Thank you for entrusting me with your care and choosing [facility].  Dr Ardis Hughs

## 2018-03-18 ENCOUNTER — Other Ambulatory Visit: Payer: Self-pay | Admitting: Internal Medicine

## 2018-03-18 ENCOUNTER — Other Ambulatory Visit: Payer: Self-pay | Admitting: Gastroenterology

## 2018-03-18 DIAGNOSIS — E785 Hyperlipidemia, unspecified: Secondary | ICD-10-CM

## 2018-03-19 DIAGNOSIS — N186 End stage renal disease: Secondary | ICD-10-CM | POA: Diagnosis not present

## 2018-03-19 DIAGNOSIS — N2581 Secondary hyperparathyroidism of renal origin: Secondary | ICD-10-CM | POA: Diagnosis not present

## 2018-03-19 DIAGNOSIS — D631 Anemia in chronic kidney disease: Secondary | ICD-10-CM | POA: Diagnosis not present

## 2018-03-21 DIAGNOSIS — D631 Anemia in chronic kidney disease: Secondary | ICD-10-CM | POA: Diagnosis not present

## 2018-03-21 DIAGNOSIS — N2581 Secondary hyperparathyroidism of renal origin: Secondary | ICD-10-CM | POA: Diagnosis not present

## 2018-03-21 DIAGNOSIS — N186 End stage renal disease: Secondary | ICD-10-CM | POA: Diagnosis not present

## 2018-03-23 DIAGNOSIS — Z992 Dependence on renal dialysis: Secondary | ICD-10-CM | POA: Diagnosis not present

## 2018-03-23 DIAGNOSIS — N186 End stage renal disease: Secondary | ICD-10-CM | POA: Diagnosis not present

## 2018-03-23 DIAGNOSIS — T8612 Kidney transplant failure: Secondary | ICD-10-CM | POA: Diagnosis not present

## 2018-03-24 DIAGNOSIS — E8779 Other fluid overload: Secondary | ICD-10-CM | POA: Diagnosis not present

## 2018-03-24 DIAGNOSIS — N186 End stage renal disease: Secondary | ICD-10-CM | POA: Diagnosis not present

## 2018-03-24 DIAGNOSIS — N2581 Secondary hyperparathyroidism of renal origin: Secondary | ICD-10-CM | POA: Diagnosis not present

## 2018-03-26 DIAGNOSIS — N186 End stage renal disease: Secondary | ICD-10-CM | POA: Diagnosis not present

## 2018-03-26 DIAGNOSIS — E8779 Other fluid overload: Secondary | ICD-10-CM | POA: Diagnosis not present

## 2018-03-26 DIAGNOSIS — N2581 Secondary hyperparathyroidism of renal origin: Secondary | ICD-10-CM | POA: Diagnosis not present

## 2018-03-28 DIAGNOSIS — N186 End stage renal disease: Secondary | ICD-10-CM | POA: Diagnosis not present

## 2018-03-28 DIAGNOSIS — N2581 Secondary hyperparathyroidism of renal origin: Secondary | ICD-10-CM | POA: Diagnosis not present

## 2018-03-28 DIAGNOSIS — E8779 Other fluid overload: Secondary | ICD-10-CM | POA: Diagnosis not present

## 2018-03-31 ENCOUNTER — Ambulatory Visit (INDEPENDENT_AMBULATORY_CARE_PROVIDER_SITE_OTHER): Payer: Medicare Other | Admitting: Internal Medicine

## 2018-03-31 ENCOUNTER — Encounter: Payer: Self-pay | Admitting: Internal Medicine

## 2018-03-31 VITALS — BP 144/62 | HR 79 | Ht 67.0 in | Wt 229.0 lb

## 2018-03-31 DIAGNOSIS — J4530 Mild persistent asthma, uncomplicated: Secondary | ICD-10-CM

## 2018-03-31 DIAGNOSIS — R0609 Other forms of dyspnea: Secondary | ICD-10-CM

## 2018-03-31 DIAGNOSIS — E8779 Other fluid overload: Secondary | ICD-10-CM | POA: Diagnosis not present

## 2018-03-31 DIAGNOSIS — F17210 Nicotine dependence, cigarettes, uncomplicated: Secondary | ICD-10-CM

## 2018-03-31 DIAGNOSIS — N2581 Secondary hyperparathyroidism of renal origin: Secondary | ICD-10-CM | POA: Diagnosis not present

## 2018-03-31 DIAGNOSIS — N186 End stage renal disease: Secondary | ICD-10-CM | POA: Diagnosis not present

## 2018-03-31 MED ORDER — BUDESONIDE-FORMOTEROL FUMARATE 80-4.5 MCG/ACT IN AERO: 2.0000 | INHALATION_SPRAY | Freq: Two times a day (BID) | RESPIRATORY_TRACT | 12 refills | Status: DC

## 2018-03-31 NOTE — Progress Notes (Signed)
Subjective:    Patient ID: Kirk [name], male    DOB: 07/02/1960     MRN: [id number]    Brief patient profile:  57 yobm active smoker  referred 10/18/2014 to [name] for eval of ? Copd for clearance for lap chole  with  no airflow obst on spiromtery   10/18/2014 on HD since 2012    History of Present Illness  10/18/2014 1st [facility] office visit/ Lonni Dirden   Chief Complaint  Patient presents with  . Advice Only    refer Dr. Nyoka Cowden. Denies any wheezing, no chest tx, no cough. Pt here to be evaluated for COPD.    variable sob with exertion - says can walk fine but no regular walking,  Some sob steps   rec Plan A = Backup=  symbiocrt 80 Take 2 puffs first thing in am and then another 2 puffs about 12 hours later.  Plan B = Back up Only use your combivent  as a rescue medication  Plan C = crisis Only use your nebulizer if you try B first and doesn't work You are cleared for surgery  > tol lap chole fine    Admit date: 04/29/2017 Discharge date: 05/02/2017  Admitted From: Home Disposition:  Home   Recommendations for Outpatient Follow-up:  1. Follow up with PCP in 1-2 weeks 2. Please obtain BMP/CBC in one week   Discharge Condition: Stable CODE STATUS:FULL Diet recommendation: Renal diet   Brief/Interim Summary: 58 year old male with end-stage renal disease on hemodialysis who presents with 48 hours of worsening malaise, associated with productive cough, headache, nausea and poor appetite. On his initial physical examination his temperature is 102.513F, heart rate 98, blood pressure 165/68, respiratory rate 26, oxygen saturation 91-92% on room air. Dry mucous membranes,mild pallor, lungs with bilateral rhonchi, heart S1-S2 present rhythmic, abdomen soft nontender, no lower extremity edema. Sodium 138, potassium 3.1, chloride 94, bicarb 29, glucose 136, BUN 22, creatinine 7.57, white count 5.5,hemoglobin 10.3, hematocrit 31.4, platelets 86.Chest x-ray with a small  patchy infiltrate at the right upper lobe.  Patient wasadmitted to the hospital with a working diagnosis of right upper lobe pneumonia,complicated by sepsis and hypokalemia.The patient was started on intravenous antibiotics. Nephrology was consulted to assist with dialysis.    Discharge Diagnoses:  Sepsis -due to pneumonia -continue ceftriaxone and azithromycin -pt has defervesced--no fever in the last 24 hours -Initially presented with fever of 102.5 F -Blood cultures remain negative -Personally reviewed chest x-ray--mild right upper lobe opacity -most recent temp 98.13F at 1240 on 05/01/17  Healthcare associated pneumonia -Defervesced on ceftriaxone and azithromycin -Check procalcitonin--1.99 -Influenza PCR--neg -Viral respiratory panel--positive coronavirus -home with cefdinir and azithromycin x 4 more days to finish 7 days of tx  Intractable headache -Likely toxic headache from the patient's sepsis -CT brain--neg for acute findings -Continue tramadol as needed severe headache -benadryl and compazine IV x 1 and IV solumedrol-->improved  ESRD -Last received dialysis for 05/01/2017 -Nephrology following -Metabolic bone disease per nephrology  Essential hypertension -Continue metoprolol tartrate and hydralazine -remains high  COPD Exacerbation -mild wheezing on exam-->improved with steroids -start IV solumedrol--home with prednisone x 3 more days -continue bronchodilators -started pulmicort while inpatient -stable on RA      07/11/2017 transition of care post hosp  f/u ov/Barnes Florek re:   Active smoker /AB with restrictive spirometry ? Obesity related  Chief Complaint  Patient presents with  . Hospitalization Follow-up    follow up for PNA in both lungs last month  05/30/17. Per patient, still has a productive cough with clear phlegm. Breathing is getting better.   Dyspnea:  7/11 ok not HT  So not back to basline yet, also limited by fatigue, esp p HD  MWF Cough: since pna coughing noct and early am x up to a tbsp, white mucus assoc with nasal congestion Sleep: up 30 degrees  SABA use:  Both hfa and neb/ confused with when to use  rec Plan A = Automatic = symbicort 80 Take 2 puffs first thing in am and then another 2 puffs about 12 hours later.  Work on inhaler technique:  relax and gently blow all the way out then take a nice smooth deep breath back in, triggering the inhaler at same time you start breathing in.  Hold for up to 5 seconds if you can. Blow out thru nose. Rinse and gargle with water when done Plan B = Backup Only use your albuterol (ventolin)as a rescue medication Plan C = Crisis - only use your albuterol nebulizer if you first try Plan B and it fails to help > ok to use the nebulizer up to every 4 hours but if start needing it regularly call for immediate appointment The key is to stop smoking completely before smoking completely stops you!    09/30/2017  f/u ov/Sandra Brents re: asthma/ smoker/ f/u [facility] pna Chief Complaint  Patient presents with  . Follow-up    Pt c/o increased cough with white sputum, SOB, nasal congestion, runny nose x 2 wks.   Dyspnea:  Walked at [employer] using rollator 2 d prior to OV   Cough: white esp hs  Sleeping: not needing saba no elevate  SABA use: more need over the last 2 weeks 2-3 days/ neb x 2  02: just at hd   rec Plan A = Automatic = symbicort increase to 160 Take 2 puffs first thing in am and then another 2 puffs about 12 hours later.  Work on [employer] B = Backup Only use your albuterol (ventolin)  as a rescue medication Plan C = Crisis - only use your albuterol nebulizer if you first try Plan B and it fails to help > ok to use the nebulizer up to every 4 hours but if start needing it regularly call for immediate appointment The key is to stop smoking completely before smoking completely stops you!     12/30/2017  f/u ov/Karver Fadden re: smoker with AB/ still smoking/  maint on symb 160 2bid Chief Complaint  Patient presents with  . Follow-up    Increased cough and wheezing for the past 2-3 wks. He is coughing up some clear sputum. He is using his albuterol inhaler about 4 x per day and neb with albuterol about 2 x per wk.    Dyspnea:  MMRC3 = can't walk 100 yards even at a slow pace at a flat grade s stopping due to sob   Cough: worse than usual, feels like a chest cold/ mucoid production only  Sleeping: sitting upright like on a couch SABA use: baseline neb once a month, up to qid x sev weeks with onset of "chest  cold" , saba does help rec Stop robitussin and use maximum dose of mucinex dm up to 1200 mg every 12hours as needed and  Take tramadol 50 mg every 4 hours if needed  Prednisone 10 mg take  4 each am x 2 days,   2 each am x 2 days,  1  each am x 2 days and stop  Prilosec should be Take 30- 60 min before your first and last meals of the day  if not improving need a cxr at the old building and office visit here asap     01/24/2018 NP Chest x ray showed fluid overload Patient is on dialysis - will need more fluid taken off Please follow up with dialysis and cardiology mucinex dm up to 1200 mg every 12hours as needed and  Take tramadol 50 mg every 4 hours if needed  Prilosec should be Take 30- 60 min before your first and last meals of the day   Admit date: 01/27/2018 Discharge date: 01/30/2018  Admitted From: Home Disposition: Home  Recommendations for Outpatient Follow-up:  3. Follow up with PCP in 1 week 4. Follow up with cardiology and pulmonology 5. Please obtain BMP/CBC in one week 6. 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 He also reports chills, no fever. He reports intermittent chest pain, which is located in the front chest, sometimes 10 out of 10 severity, currentlychest pain-free, no tenderness in the calf areas. Patient cannot characterize further if hischest pain is pleuritic or exertional. He reports headache in the frontal head. No unilateral weakness, numbness or tingling his extremities. No facial droop or slurred speech. Patient states that he has intermittent rectal bleeding with bright red-colored blood recently.He was seen by his GI, Dr. Ardis Hughs in 12/2017 and was told to takefiber supplements.He has nausea and vomiting sometimes, currently no abdominal pain or diarrhea. He denies symptoms of UTI. Pt states that he was seenby his pulmonologist on Friday, and hadanegative chest x-ray.  ED Course:pt was found to have positive troponin 0.03, positive d-dimer at 0.76, potassium 3.3, bicarbonate 29, creatinine 8.45, BUN 18, hemoglobin 7.8 (11.7 on 06/28/2017, 9.0 on 06/05/2017), BMP 51.2, temperature normal, no tachycardia, oxygen saturation 88-->100% on RA,chest x-ray with cardiomegaly and vascular congestion. Patient is placed on telemetry bed of observation.    Hospital course:  Acute diastolic heart failure Pulmonary vascular congestion on chest x-ray. Transthoracic Echocardiogram significant for a normal EF with grade 2 diastolic dysfunction. Patient reports symptoms consistent with orthopnea. Improved with HD. Counseled on proper diet and  information given prior to discharge.  Asthma exacerbation Patient without a history of COPD, but rather asthma. Significant wheezing requiring IV steroids and breathing treatments. Patient's wheezing and exercise tolerance improved with treatment. Also given a course of azithromycin. Continue 5 day total course as an outpatient. Continue home inhalers.  Chest pain CTA negative for PE. Associated mild troponin elevation. Cardiology consulted. And recommended no inpatient workup since Transthoracic Echocardiogram not significant for wall motion abnormalities or decreased EF.  ESRD on HD HD per nephrology  Tobacco abuse Patient counseled prior this hospitalization.  Hemorrhoids Diet recommendations made for patient. Patient sees outpatient GI for history of rectal bleeding with a recent colonoscopy significant for  precancerous polyps. Hemoglobin stable.  Acute on chronic diastolic heart failure Resolved with HD. Follow-up outpatient with cardiology.  Discharge Diagnoses:  Principal Problem:   Acute respiratory failure with hypoxia (HCC) Active Problems:   GIB (gastrointestinal bleeding)   OSA on CPAP   HTN (hypertension)   Anemia in chronic renal disease   Pancytopenia (HCC)   ESRD on dialysis [facility])   Chest pain   Hypokalemia   Cigarette smoker   Elevated troponin     03/31/2018  f/u ov/Ambri Miltner re: resumed smoking /HD MWF / cpap dr dohmeier  Chief Complaint  Patient presents with  . Follow-up    Breathing is overall doing well. He has not had to use the albuterol since the last visit.    Dyspnea:  MMRC3 = can't walk 100 yards even at a slow pace at a flat grade s stopping due to sob  (not able to reproduce this in office ) Cough: worse p supper / dry Sleeping: on cough ok on cpap  SABA use:  None  02: none    No obvious day to day  variability or assoc excess/ purulent sputum or mucus plugs or hemoptysis or cp or chest tightness, subjective wheeze or overt sinus or  hb symptoms.   Sleeping as above without nocturnal  or early am exacerbation  of respiratory  c/o's or need for noct saba. Also denies any obvious fluctuation of symptoms with weather or environmental changes or other aggravating or alleviating factors except as outlined above   No unusual exposure hx or h/o childhood pna/ asthma or knowledge of premature birth.  Current Allergies, Complete Past Medical History, Past Surgical History, Family History, and Social History were reviewed in [employer] record.  ROS  The following are not active complaints unless bolded Hoarseness, sore throat, dysphagia, dental problems, itching, sneezing,  nasal congestion or discharge of excess mucus or purulent secretions, ear ache,   fever, chills, sweats, unintended wt loss or wt gain, classically pleuritic or exertional cp,  orthopnea pnd or arm/hand swelling  or leg swelling, presyncope, palpitations, abdominal pain, anorexia, nausea, vomiting, diarrhea  or change in bowel habits or change in bladder habits, change in stools or change in urine, dysuria, hematuria,  rash, arthralgias, visual complaints, headache, numbness, weakness or ataxia or problems with walking or coordination,  change in mood or  memory.        Current Meds  Medication Sig  . acetaminophen (TYLENOL) 500 MG tablet Take 1 tablet (500 mg total) by mouth every 6 (six) hours as needed for moderate pain.  Marland Kitchen albuterol (PROVENTIL HFA;VENTOLIN HFA) 108 (90 Base) MCG/ACT inhaler Inhale 2 puffs into the lungs every 6 (six) hours as needed for wheezing or shortness of breath.  Marland Kitchen aspirin EC 81 MG tablet Take 81 mg by mouth daily.  Marland Kitchen b complex-vitamin c-folic acid (NEPHRO-VITE) 0.8 MG TABS tablet TAKE 1 TABLET BY MOUTH EVERY DAY  . budesonide-formoterol (SYMBICORT) 160-4.5 MCG/ACT inhaler Take 2 puffs first thing in am and then another 2 puffs about 12 hours later.  . calcitRIOL (ROCALTROL) 0.25 MCG capsule Take 1 capsule (0.25 mcg  total) by mouth every Monday, Wednesday, and Friday with hemodialysis.  . Cholecalciferol (VITAMIN D3) 5000 UNITS TABS Take 1 tablet by mouth daily.  . clonazePAM (KLONOPIN) 0.5 MG tablet Take 1 tablet (0.5 mg total) by mouth 2 (two) times daily as needed for anxiety.  . diphenhydrAMINE (BENADRYL) 25 MG tablet Take 25 mg by mouth at bedtime  as needed for allergies.  Marland Kitchen guaiFENesin-dextromethorphan (ROBITUSSIN DM) 100-10 MG/5ML syrup Take 10 mLs by mouth every 4 (four) hours as needed for cough.  . hydrALAZINE (APRESOLINE) 50 MG tablet Take 100 mg by mouth daily.  . metoprolol tartrate (LOPRESSOR) 50 MG tablet Take 1.5 tablets (75 mg total) by mouth 2 (two) times daily. Take One and a half tablet by mouth twice daily  . omeprazole (PRILOSEC) 20 MG capsule TAKE 1 CAPSULE TWICE DAILY BEFORE A MEAL  . ondansetron (ZOFRAN ODT) 4 MG disintegrating tablet Take 1 tablet (4 mg total) by mouth every 8 (eight) hours as needed for nausea or vomiting.  . pravastatin (PRAVACHOL) 40 MG tablet TAKE 1 TABLET EVERY EVENING  . Probiotic Product (PROBIOTIC-10 PO) Take 1 capsule by mouth daily.   . sucroferric oxyhydroxide (VELPHORO) 500 MG chewable tablet Chew 500-1,500 mg by mouth See admin instructions. Take 1500 mg with meals and 500 mg with snacks  . traMADol (ULTRAM) 50 MG tablet One every 4 hours as needed for cough (Patient taking differently: Take 50 mg by mouth every 6 (six) hours as needed for moderate pain (cough). )             Objective:   Physical Exam  Somber amb bm nad   03/31/2018         229  12/30/2017      222  09/30/2017         222  07/11/2017       217   10/18/14 233 lb 12.8 oz (106.051 kg)  10/05/14 235 lb 3.2 oz (106.686 kg)  09/20/14 230 lb 6.1 oz (104.5 kg)    Vital signs reviewed - Note on arrival 02 sats  97% on RA     HEENT: nl dentition, turbinates bilaterally, and oropharynx. Nl external ear canals without cough reflex   NECK :  without JVD/Nodes/TM/ nl carotid upstrokes  bilaterally   LUNGS: no acc muscle use,  Nl contour chest which is clear to A and P bilaterally without cough on insp or exp maneuvers   CV:  RRR  no s3 or murmur or increase in P2, and no significant peripheral edema   ABD:  Quite obese but soft and nontender with nl inspiratory excursion in the supine position. No bruits or organomegaly appreciated, bowel sounds nl  MS:  Nl gait/ ext warm without deformities, calf tenderness, cyanosis or clubbing No obvious joint restrictions   SKIN: warm and dry without lesions    NEURO:  alert, approp, nl sensorium with  no motor or cerebellar deficits apparent.              Assessment & Plan:

## 2018-03-31 NOTE — Patient Instructions (Signed)
Work on inhaler technique:  relax and gently blow all the way out then take a nice smooth deep breath back in, triggering the inhaler at same time you start breathing in.  Hold for up to 5 seconds if you can. Blow out thru nose. Rinse and gargle with water when done     Change symbicort to 80 Take 2 puffs first thing in am and then another 2 puffs about 12 hours later.     Only use your albuterol as a rescue medication to be used if you can't catch your breath by resting or doing a relaxed purse lip breathing pattern.  - The less you use it, the better it will work when you need it. - Ok to use up to 2 puffs  every 4 hours if you must but call for immediate appointment if use goes up over your usual need - Don't leave home without it !!  (think of it like the spare tire for your car)    Please schedule a follow up visit in 3 months but call sooner if needed  with all medications /inhalers/ solutions in hand so we can verify exactly what you are taking. This includes all medications from all doctors and over the counters

## 2018-04-01 ENCOUNTER — Encounter: Payer: Self-pay | Admitting: Internal Medicine

## 2018-04-01 DIAGNOSIS — N186 End stage renal disease: Secondary | ICD-10-CM | POA: Diagnosis not present

## 2018-04-01 DIAGNOSIS — E8779 Other fluid overload: Secondary | ICD-10-CM | POA: Diagnosis not present

## 2018-04-01 DIAGNOSIS — N2581 Secondary hyperparathyroidism of renal origin: Secondary | ICD-10-CM | POA: Diagnosis not present

## 2018-04-01 NOTE — Assessment & Plan Note (Signed)
Active smoker - Spirometry 07/11/2017  Restrictive/ min curvature on no rx - 07/11/2017   Try symb 80 2bid - 09/30/2017     try symbicort 160 2bid as has rhonchi on exam, still smoking  - Spirometry 03/31/2018  FEV1 1.4 (41%)  Ratio 0.72 min curvature p symb 160 x 2 in am with poor baseline hfa - 03/31/2018  After extensive coaching inhaler device,  effectiveness =    75% from baseline < 50% (short ti)  So try again to use symb 80 2 bid to reduce upper airway symptoms     Sometimes less is more in this setting:  If he can learn hfa better then less med will be needed and less likely to irritate the upper airway - other option is to add spacer to high dose if flares on the 80   In meantime need to quit smoking now (see separate a/p)

## 2018-04-01 NOTE — Assessment & Plan Note (Signed)
4-5 min discussion re active cigarette smoking in addition to office E&M  Ask about tobacco use:   ongoing Advise quitting     I reviewed the Fletcher curve with the patient that basically indicates  if you quit smoking when your best day FEV1 is still well preserved (as is clearly  the case here)  it is highly unlikely you will progress to severe disease and informed the patient there was  no medication on the market that has proven to alter the curve/ its downward trajectory  or the likelihood of progression of their disease(unlike other chronic medical conditions such as atheroclerosis where we do think we can change the natural hx with risk reducing meds)    Therefore stopping smoking and maintaining abstinence are  the most important aspects of care, not choice of inhalers or for that matter, doctors. Treatment other than smoking cessation  is entirely directed by severity of symptoms and focused also on reducing exacerbations, not attempting to change the natural history of the disease.   Assess willingness:  Not committed at this point Assist in quit attempt:  Per PCP when ready Arrange follow up:   Follow up per Primary Care planned  For smoking cessation classes call [phone]     I had an extended discussion with the patient  /wife  reviewing all relevant studies completed to date and  lasting 15 to 20 minutes of a 25 minute visit  which included directly observing ambulatory 02 saturation study documented in a/p section of  today's  office note.  See device teaching which extended face to face time for this visit   Each maintenance medication was reviewed in detail including most importantly the difference between maintenance and prns and under what circumstances the prns are to be triggered using an action plan format that is not reflected in the computer generated alphabetically organized AVS.     Please see AVS for specific instructions unique to this visit that I personally wrote  and verbalized to the the pt in detail and then reviewed with pt  by my nurse highlighting any changes in therapy recommended at today's visit .

## 2018-04-01 NOTE — Assessment & Plan Note (Addendum)
Onset around 2016   10/18/2014  Walked RA x 3 laps @ 185 ft each stopped due to End of study, nl pace, no  desat  - min sob  - spirometry 10/18/2014 neg for obst  - Spirometry 07/11/2017  Mod restriction/ min curvature with no rx prior  -  07/11/2017  Walked RA x 3 laps @ 185 ft each stopped due to  End of study, nl pace, no sob or desat    - 03/31/2018   Walked RA  2 laps @  approx 2103ft each @ nl pace  stopped due to  End of study, no sob or desats  Not able to reproduce this chronic complaint and strongly suspect obesity/ deconditioning over chf/ vol overload as no better p HD so no change in recs but clearly needs to be more active/ encouraged strongly.

## 2018-04-03 DIAGNOSIS — E8779 Other fluid overload: Secondary | ICD-10-CM | POA: Diagnosis not present

## 2018-04-03 DIAGNOSIS — N2581 Secondary hyperparathyroidism of renal origin: Secondary | ICD-10-CM | POA: Diagnosis not present

## 2018-04-03 DIAGNOSIS — N186 End stage renal disease: Secondary | ICD-10-CM | POA: Diagnosis not present

## 2018-04-04 DIAGNOSIS — N186 End stage renal disease: Secondary | ICD-10-CM | POA: Diagnosis not present

## 2018-04-04 DIAGNOSIS — E8779 Other fluid overload: Secondary | ICD-10-CM | POA: Diagnosis not present

## 2018-04-04 DIAGNOSIS — N2581 Secondary hyperparathyroidism of renal origin: Secondary | ICD-10-CM | POA: Diagnosis not present

## 2018-04-07 DIAGNOSIS — N186 End stage renal disease: Secondary | ICD-10-CM | POA: Diagnosis not present

## 2018-04-07 DIAGNOSIS — E8779 Other fluid overload: Secondary | ICD-10-CM | POA: Diagnosis not present

## 2018-04-07 DIAGNOSIS — N2581 Secondary hyperparathyroidism of renal origin: Secondary | ICD-10-CM | POA: Diagnosis not present

## 2018-04-09 DIAGNOSIS — E8779 Other fluid overload: Secondary | ICD-10-CM | POA: Diagnosis not present

## 2018-04-09 DIAGNOSIS — N2581 Secondary hyperparathyroidism of renal origin: Secondary | ICD-10-CM | POA: Diagnosis not present

## 2018-04-09 DIAGNOSIS — N186 End stage renal disease: Secondary | ICD-10-CM | POA: Diagnosis not present

## 2018-04-11 DIAGNOSIS — N2581 Secondary hyperparathyroidism of renal origin: Secondary | ICD-10-CM | POA: Diagnosis not present

## 2018-04-11 DIAGNOSIS — N186 End stage renal disease: Secondary | ICD-10-CM | POA: Diagnosis not present

## 2018-04-11 DIAGNOSIS — E8779 Other fluid overload: Secondary | ICD-10-CM | POA: Diagnosis not present

## 2018-04-14 DIAGNOSIS — E8779 Other fluid overload: Secondary | ICD-10-CM | POA: Diagnosis not present

## 2018-04-14 DIAGNOSIS — N2581 Secondary hyperparathyroidism of renal origin: Secondary | ICD-10-CM | POA: Diagnosis not present

## 2018-04-14 DIAGNOSIS — N186 End stage renal disease: Secondary | ICD-10-CM | POA: Diagnosis not present

## 2018-04-16 DIAGNOSIS — N186 End stage renal disease: Secondary | ICD-10-CM | POA: Diagnosis not present

## 2018-04-16 DIAGNOSIS — N2581 Secondary hyperparathyroidism of renal origin: Secondary | ICD-10-CM | POA: Diagnosis not present

## 2018-04-16 DIAGNOSIS — E8779 Other fluid overload: Secondary | ICD-10-CM | POA: Diagnosis not present

## 2018-04-18 DIAGNOSIS — E8779 Other fluid overload: Secondary | ICD-10-CM | POA: Diagnosis not present

## 2018-04-18 DIAGNOSIS — N186 End stage renal disease: Secondary | ICD-10-CM | POA: Diagnosis not present

## 2018-04-18 DIAGNOSIS — N2581 Secondary hyperparathyroidism of renal origin: Secondary | ICD-10-CM | POA: Diagnosis not present

## 2018-04-21 ENCOUNTER — Other Ambulatory Visit: Payer: Self-pay | Admitting: Internal Medicine

## 2018-04-21 DIAGNOSIS — F419 Anxiety disorder, unspecified: Secondary | ICD-10-CM

## 2018-04-21 DIAGNOSIS — E8779 Other fluid overload: Secondary | ICD-10-CM | POA: Diagnosis not present

## 2018-04-21 DIAGNOSIS — N186 End stage renal disease: Secondary | ICD-10-CM | POA: Diagnosis not present

## 2018-04-21 DIAGNOSIS — N2581 Secondary hyperparathyroidism of renal origin: Secondary | ICD-10-CM | POA: Diagnosis not present

## 2018-04-23 DIAGNOSIS — D509 Iron deficiency anemia, unspecified: Secondary | ICD-10-CM | POA: Diagnosis not present

## 2018-04-23 DIAGNOSIS — J4530 Mild persistent asthma, uncomplicated: Secondary | ICD-10-CM | POA: Diagnosis not present

## 2018-04-23 DIAGNOSIS — Z992 Dependence on renal dialysis: Secondary | ICD-10-CM | POA: Diagnosis not present

## 2018-04-23 DIAGNOSIS — D631 Anemia in chronic kidney disease: Secondary | ICD-10-CM | POA: Diagnosis not present

## 2018-04-23 DIAGNOSIS — N186 End stage renal disease: Secondary | ICD-10-CM | POA: Diagnosis not present

## 2018-04-23 DIAGNOSIS — N2581 Secondary hyperparathyroidism of renal origin: Secondary | ICD-10-CM | POA: Diagnosis not present

## 2018-04-23 DIAGNOSIS — T8612 Kidney transplant failure: Secondary | ICD-10-CM | POA: Diagnosis not present

## 2018-04-23 DIAGNOSIS — J069 Acute upper respiratory infection, unspecified: Secondary | ICD-10-CM | POA: Diagnosis not present

## 2018-04-25 ENCOUNTER — Ambulatory Visit (INDEPENDENT_AMBULATORY_CARE_PROVIDER_SITE_OTHER): Payer: Medicare Other | Admitting: Nurse Practitioner

## 2018-04-25 ENCOUNTER — Encounter: Payer: Self-pay | Admitting: Nurse Practitioner

## 2018-04-25 ENCOUNTER — Other Ambulatory Visit: Payer: Self-pay

## 2018-04-25 DIAGNOSIS — N186 End stage renal disease: Secondary | ICD-10-CM | POA: Diagnosis not present

## 2018-04-25 DIAGNOSIS — J4530 Mild persistent asthma, uncomplicated: Secondary | ICD-10-CM | POA: Diagnosis not present

## 2018-04-25 DIAGNOSIS — N2581 Secondary hyperparathyroidism of renal origin: Secondary | ICD-10-CM | POA: Diagnosis not present

## 2018-04-25 DIAGNOSIS — D631 Anemia in chronic kidney disease: Secondary | ICD-10-CM | POA: Diagnosis not present

## 2018-04-25 DIAGNOSIS — J069 Acute upper respiratory infection, unspecified: Secondary | ICD-10-CM

## 2018-04-25 DIAGNOSIS — D509 Iron deficiency anemia, unspecified: Secondary | ICD-10-CM | POA: Diagnosis not present

## 2018-04-25 MED ORDER — DOXYCYCLINE HYCLATE 100 MG PO TABS: 100.0000 mg | ORAL_TABLET | Freq: Two times a day (BID) | ORAL | 0 refills | Status: DC

## 2018-04-25 NOTE — Patient Instructions (Addendum)
Doxycyline 100 mg by mouth twice daily has been sent to your pharmacy To take twice daily with food To use probiotic to prevent GI upset  To monitor symptoms and if you become worse seek emergency care- notify providers that you have been exposed to COVID-19 and being evaluated for this.  All persons in your household should self quarantine for 14 days and wait until symptoms and fever have resolved for 72 hours.     Person Under Monitoring Name: Kirk [name]  Location: [place] [address]   Infection Prevention Recommendations for Individuals Confirmed to have, or Being Evaluated for, 2019 Novel Coronavirus (COVID-19) Infection Who Receive Care at Home  Individuals who are confirmed to have, or are being evaluated for, COVID-19 should follow the prevention steps below until a healthcare provider or local or state health department says they can return to normal activities.  Stay home except to get medical care You should restrict activities outside your home, except for getting medical care. Do not go to work, school, or public areas, and do not use public transportation or taxis.  Call ahead before visiting your doctor Before your medical appointment, call the healthcare provider and tell them that you have, or are being evaluated for, COVID-19 infection. This will help the healthcare provider's office take steps to keep other people from getting infected. Ask your healthcare provider to call the local or state health department.  Monitor your symptoms Seek prompt medical attention if your illness is worsening (e.g., difficulty breathing). Before going to your medical appointment, call the healthcare provider and tell them that you have, or are being evaluated for, COVID-19 infection. Ask your healthcare provider to call the local or state health department.  Wear a facemask You should wear a facemask that covers your nose and mouth when you are in the  same room with other people and when you visit a healthcare provider. People who live with or visit you should also wear a facemask while they are in the same room with you.  Separate yourself from other people in your home As much as possible, you should stay in a different room from other people in your home. Also, you should use a separate bathroom, if available.  Avoid sharing household items You should not share dishes, drinking glasses, cups, eating utensils, towels, bedding, or other items with other people in your home. After using these items, you should wash them thoroughly with soap and water.  Cover your coughs and sneezes Cover your mouth and nose with a tissue when you cough or sneeze, or you can cough or sneeze into your sleeve. Throw used tissues in a lined trash can, and immediately wash your hands with soap and water for at least 20 seconds or use an alcohol-based hand rub.  Wash your [facility] your hands often and thoroughly with soap and water for at least 20 seconds. You can use an alcohol-based hand sanitizer if soap and water are not available and if your hands are not visibly dirty. Avoid touching your eyes, nose, and mouth with unwashed hands.   Prevention Steps for Caregivers and Household Members of Individuals Confirmed to have, or Being Evaluated for, COVID-19 Infection Being Cared for in the Home  If you live with, or provide care at home for, a person confirmed to have, or being evaluated for, COVID-19 infection please follow these guidelines to prevent infection:  Follow healthcare provider's instructions Make sure that you understand  and can help the patient follow any healthcare provider instructions for all care.  Provide for the patient's basic needs You should help the patient with basic needs in the home and provide support for getting groceries, prescriptions, and other personal needs.  Monitor the patient's symptoms If they are getting  sicker, call his or her medical provider and tell them that the patient has, or is being evaluated for, COVID-19 infection. This will help the healthcare provider's office take steps to keep other people from getting infected. Ask the healthcare provider to call the local or state health department.  Limit the number of people who have contact with the patient  If possible, have only one caregiver for the patient.  Other household members should stay in another home or place of residence. If this is not possible, they should stay  in another room, or be separated from the patient as much as possible. Use a separate bathroom, if available.  Restrict visitors who do not have an essential need to be in the home.  Keep older adults, very young children, and other sick people away from the patient Keep older adults, very young children, and those who have compromised immune systems or chronic health conditions away from the patient. This includes people with chronic heart, lung, or kidney conditions, diabetes, and cancer.  Ensure good ventilation Make sure that shared spaces in the home have good air flow, such as from an air conditioner or an opened window, weather permitting.  Wash your hands often  Wash your hands often and thoroughly with soap and water for at least 20 seconds. You can use an alcohol based hand sanitizer if soap and water are not available and if your hands are not visibly dirty.  Avoid touching your eyes, nose, and mouth with unwashed hands.  Use disposable paper towels to dry your hands. If not available, use dedicated cloth towels and replace them when they become wet.  Wear a facemask and gloves  Wear a disposable facemask at all times in the room and gloves when you touch or have contact with the patient's blood, body fluids, and/or secretions or excretions, such as sweat, saliva, sputum, nasal mucus, vomit, urine, or feces.  Ensure the mask fits over your nose and  mouth tightly, and do not touch it during use.  Throw out disposable facemasks and gloves after using them. Do not reuse.  Wash your hands immediately after removing your facemask and gloves.  If your personal clothing becomes contaminated, carefully remove clothing and launder. Wash your hands after handling contaminated clothing.  Place all used disposable facemasks, gloves, and other waste in a lined container before disposing them with other household waste.  Remove gloves and wash your hands immediately after handling these items.  Do not share dishes, glasses, or other household items with the patient  Avoid sharing household items. You should not share dishes, drinking glasses, cups, eating utensils, towels, bedding, or other items with a patient who is confirmed to have, or being evaluated for, COVID-19 infection.  After the person uses these items, you should wash them thoroughly with soap and water.  Wash laundry thoroughly  Immediately remove and wash clothes or bedding that have blood, body fluids, and/or secretions or excretions, such as sweat, saliva, sputum, nasal mucus, vomit, urine, or feces, on them.  Wear gloves when handling laundry from the patient.  Read and follow directions on labels of laundry or clothing items and detergent. In general, wash and dry  with the warmest temperatures recommended on the label.  Clean all areas the individual has used often  Clean all touchable surfaces, such as counters, tabletops, doorknobs, bathroom fixtures, toilets, phones, keyboards, tablets, and bedside tables, every day. Also, clean any surfaces that may have blood, body fluids, and/or secretions or excretions on them.  Wear gloves when cleaning surfaces the patient has come in contact with.  Use a diluted bleach solution (e.g., dilute bleach with 1 part bleach and 10 parts water) or a household disinfectant with a label that says EPA-registered for coronaviruses. To make a  bleach solution at home, add 1 tablespoon of bleach to 1 quart (4 cups) of water. For a larger supply, add  cup of bleach to 1 gallon (16 cups) of water.  Read labels of cleaning products and follow recommendations provided on product labels. Labels contain instructions for safe and effective use of the cleaning product including precautions you should take when applying the product, such as wearing gloves or eye protection and making sure you have good ventilation during use of the product.  Remove gloves and wash hands immediately after cleaning.  Monitor yourself for signs and symptoms of illness Caregivers and household members are considered close contacts, should monitor their health, and will be asked to limit movement outside of the home to the extent possible. Follow the monitoring steps for close contacts listed on the symptom monitoring form.   ? If you have additional questions, contact your local health department or call the epidemiologist on call at [phone] (available 24/7). ? This guidance is subject to change. For the most up-to-date guidance from [facility], please refer to their website: [email or web address]

## 2018-04-25 NOTE — Progress Notes (Signed)
This service is provided via telemedicine  No vital signs collected/recorded due to the encounter was a telemedicine visit.   Location of patient (ex: home, work): Driving in the car   Patient consents to a telephone visit:Yes   Location of the provider (ex: office, home):Office  Name of any referring provider:Trigo Winterbottom,NP   Names of all persons participating in the telemedicine service and their role in the encounter:Brok Stocking NP,Tiffany [place] CMA,Kirk [name](patient)  Time spent on call: 6 mins   Virtual Visit via Telephone Note  I connected with Kirk [name] on 04/25/18 at  1:30 PM EDT by telephone and verified that I am speaking with the correct person using two identifiers.   I discussed the limitations, risks, security and privacy concerns of performing an evaluation and management service by telephone and the availability of in person appointments. I also discussed with the patient that there may be a patient responsible charge related to this service. The patient expressed understanding and agreed to proceed.     Careteam: Patient Care Team: Gayland Curry, DO as PCP - General (Geriatric Medicine) Debara Pickett Nadean Corwin, MD as PCP - Cardiology (Cardiology) Jovita Kussmaul, MD as Consulting Physician (General Surgery) [name], MD as Consulting Physician (Pulmonary Disease) Estanislado Emms, MD as Consulting Physician (Nephrology) Elam Dutch, MD as Consulting Physician (Vascular Surgery) [name], MD as Consulting Physician (Cardiology) Milus Banister, MD as Attending Physician (Gastroenterology)  Advanced Directive information    Allergies  Allergen Reactions  . Codeine Nausea And Vomiting    Chief Complaint  Patient presents with  . Acute Visit    Exposure to COVID 19, SOB,Runny nose,cough, fever 96-99,chillls, symptoms for 1 week, Patient has not been tested      HPI: Patient is a 58 y.o. male  Fever off and on Cold  chills Coughing Trouble breathing/shortness of breath Little bit of diarrhea Symptoms have been going on for a week to 1.5 week.  Reports dialysis stated they had an exposure and feels like he was sitting behind the person.  Temperature this morning was 99.1 Uses tylenol PRN.  Eating and drinking so-so.  Baseline loss of appetite- this has been going on for a few month.  Reports shortness of breath started this morning at dialysis, he was placed on oxygen which he reports helped.  Feels tight but "able to handle it" Has some congestion/rattling but not going anywhere. This has been ongoing for ~1 week.  He has been on mucinex 2 tablets BID from Dr Melvyn Novas  Wheezing a little, wife on phone and states he is not having an increase in wheezing like when he has pneumonia. Has not needed to use albuterol, continues on Symbicort.  He was hospitalized in January due to fluid overload.    Review of Systems:  Review of Systems  Constitutional: Positive for chills, fever and malaise/fatigue.  HENT: Positive for congestion. Negative for ear pain, sinus pain and sore throat.   Respiratory: Positive for cough and shortness of breath. Negative for sputum production and wheezing.   Cardiovascular: Negative for chest pain.  Skin: Negative for itching and rash.  Neurological: Positive for dizziness and headaches.    Past Medical History:  Diagnosis Date  . Acute edema of lung, unspecified   . Acute, but ill-defined, cerebrovascular disease   . Allergy   . Anemia   . 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)   . Anxiety   . Asthma   . Carpal  tunnel syndrome   . Cellulitis and abscess of trunk   . Cholelithiasis 07/13/2014  . Chronic headaches   . Debility, unspecified   . Dermatophytosis of the body   . Dysrhythmia    history of  . Edema   . End stage renal disease on dialysis [facility])    "MWF; [facility]" (10/21/2014)  . Essential hypertension, benign   . GERD  (gastroesophageal reflux disease)   . Gout, unspecified   . HTN (hypertension)   . Hypertrophy of prostate without urinary obstruction and other lower urinary tract symptoms (LUTS)   . Hypotension, unspecified   . Impotence of organic origin   . Insomnia, unspecified   . Kidney replaced by transplant   . Localization-related (focal) (partial) epilepsy and epileptic syndromes with complex partial seizures, without mention of intractable epilepsy   . Lumbago   . Memory loss   . OSA on CPAP   . Other and unspecified hyperlipidemia   . Other chronic nonalcoholic liver disease   . Other malaise and fatigue   . Other nonspecific abnormal serum enzyme levels   . Pain in joint, lower leg   . Pain in joint, upper arm   . Pneumonia "several times"  . Renal dialysis status(V45.11) 02/05/2010   restarted 01/02/13 ofter renal trransplant failure  . Secondary hyperparathyroidism (of renal origin)   . Shortness of breath   . Sleep apnea   . Tension headache   . Unspecified constipation   . Unspecified essential hypertension   . Unspecified hereditary and idiopathic peripheral neuropathy   . Unspecified vitamin D deficiency    Past Surgical History:  Procedure Laterality Date  . AV FISTULA PLACEMENT Left ?2010   "forearm; at [facility]"  . BACK SURGERY    . CARDIAC CATHETERIZATION  03/21/2011  . CHOLECYSTECTOMY N/A 10/21/2014   Procedure: LAPAROSCOPIC CHOLECYSTECTOMY WITH INTRAOPERATIVE CHOLANGIOGRAM;  Surgeon: Autumn Messing III, MD;  Location: [place];  Service: General;  Laterality: N/A;  . COLONOSCOPY    . 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 KIDNEY TRANSPLANT  08/17/2011   [place]  10/21/2014   w/IOC  . LEFT HEART CATHETERIZATION WITH CORONARY ANGIOGRAM N/A 03/21/2011   Procedure: LEFT HEART CATHETERIZATION WITH CORONARY ANGIOGRAM;  Surgeon: [name], MD;  Location: [facility] CATH LAB;  Service: Cardiovascular;  Laterality:  N/A;  . NEPHRECTOMY  08/2013   removed transplaned kidney  . POSTERIOR FUSION CERVICAL SPINE  06/25/2012   for spinal stenosis  . VASECTOMY  2010   Social History:   reports that he has been smoking cigarettes. He has a 16.00 pack-year smoking history. He has never used smokeless tobacco. He reports that he does not drink alcohol or use drugs.  Family History  Adopted: Yes  Problem Relation Age of Onset  . Colon cancer Neg Hx   . Esophageal cancer Neg Hx   . Rectal cancer Neg Hx   . Stomach cancer Neg Hx     Medications: Patient's Medications  New Prescriptions   No medications on file  Previous Medications   ACETAMINOPHEN (TYLENOL) 500 MG TABLET    Take 1 tablet (500 mg total) by mouth every 6 (six) hours as needed for moderate pain.   ALBUTEROL (PROVENTIL HFA;VENTOLIN HFA) 108 (90 BASE) MCG/ACT INHALER    Inhale 2 puffs into the lungs every 6 (six) hours as needed for wheezing or shortness of breath.   ASPIRIN EC  81 MG TABLET    Take 81 mg by mouth daily.   B COMPLEX-VITAMIN C-FOLIC ACID (NEPHRO-VITE) 0.8 MG TABS TABLET    TAKE 1 TABLET BY MOUTH EVERY DAY   BUDESONIDE-FORMOTEROL (SYMBICORT) 160-4.5 MCG/ACT INHALER    Take 2 puffs first thing in am and then another 2 puffs about 12 hours later.   CALCITRIOL (ROCALTROL) 0.25 MCG CAPSULE    Take 1 capsule (0.25 mcg total) by mouth every Monday, Wednesday, and Friday with hemodialysis.   CHOLECALCIFEROL (VITAMIN D3) 5000 UNITS TABS    Take 1 tablet by mouth daily.   CLONAZEPAM (KLONOPIN) 0.5 MG TABLET    TAKE 1 TABLET (0.5 MG TOTAL) BY MOUTH 2 (TWO) TIMES DAILY AS NEEDED FOR ANXIETY.   DIPHENHYDRAMINE (BENADRYL) 25 MG TABLET    Take 25 mg by mouth at bedtime as needed for allergies.   GUAIFENESIN-DEXTROMETHORPHAN (ROBITUSSIN DM) 100-10 MG/5ML SYRUP    Take 10 mLs by mouth every 4 (four) hours as needed for cough.   HYDRALAZINE (APRESOLINE) 50 MG TABLET    Take 200 mg by mouth daily.    METOPROLOL TARTRATE (LOPRESSOR) 50 MG TABLET     Take 1.5 tablets (75 mg total) by mouth 2 (two) times daily. Take One and a half tablet by mouth twice daily   OMEPRAZOLE (PRILOSEC) 20 MG CAPSULE    TAKE 1 CAPSULE TWICE DAILY BEFORE A MEAL   ONDANSETRON (ZOFRAN ODT) 4 MG DISINTEGRATING TABLET    Take 1 tablet (4 mg total) by mouth every 8 (eight) hours as needed for nausea or vomiting.   PRAVASTATIN (PRAVACHOL) 40 MG TABLET    TAKE 1 TABLET EVERY EVENING   PROBIOTIC PRODUCT (PROBIOTIC-10 PO)    Take 1 capsule by mouth daily.    SUCROFERRIC OXYHYDROXIDE (VELPHORO) 500 MG CHEWABLE TABLET    Chew 500-1,500 mg by mouth See admin instructions. Take 1500 mg with meals and 500 mg with snacks   TRAMADOL (ULTRAM) 50 MG TABLET    One every 4 hours as needed for cough  Modified Medications   No medications on file  Discontinued Medications   BUDESONIDE-FORMOTEROL (SYMBICORT) 80-4.5 MCG/ACT INHALER    Inhale 2 puffs into the lungs 2 (two) times daily.     Physical Exam:  Unable due to televist.  Labs reviewed: Basic Metabolic Panel: Recent Labs    05/01/17 0455  05/31/17 0313  01/28/18 0242 01/28/18 0618 01/29/18 0655 02/03/18 1523  NA 140   < > 138   < >  --  139 138 140  K 3.4*   < > 4.3   < >  --  3.8 4.6 4.0  CL 98*   < > 99*   < >  --  98 99 99  CO2 23   < > 26   < >  --  28 30 29   GLUCOSE 126*   < > 109*   < >  --  101* 128* 164*  BUN 56*   < > 25*   < >  --  23* 20 43*  CREATININE 13.83*   < > 8.51*   < >  --  10.07* 7.98* 8.37*  CALCIUM 8.8*   < > 8.6*   < >  --  8.9 9.8 8.4*  MG  --   --   --   --   --   --  2.1  --   PHOS 9.0*  --  5.3*  --   --   --   --   --  TSH  --   --   --   --  0.787  --   --   --    < > = values in this interval not displayed.   Liver Function Tests: Recent Labs    04/30/17 0449  05/30/17 1448 05/31/17 0313 06/28/17 1706  AST 28  --  21  --  19  ALT 21  --  22  --  21  ALKPHOS 54  --  55  --  74  BILITOT 0.9  --  0.8  --  0.8  PROT 7.0  --  6.7  --  8.0  ALBUMIN 3.4*   < > 3.5 3.4* 3.9    < > = values in this interval not displayed.   Recent Labs    06/28/17 1706  LIPASE 94*   No results for input(s): AMMONIA in the last 8760 hours. CBC: Recent Labs    01/27/18 2100  01/28/18 0732 01/29/18 0655 02/03/18 1523  WBC 3.7*   < > 3.5* 4.8 5.8  NEUTROABS 2.6  --   --  3.8 4,043  HGB 7.8*   < > 8.0* 9.2* 8.5*  HCT 24.9*   < > 25.2* 28.8* 25.3*  MCV 101.2*   < > 102.9* 101.1* 97.7  PLT 97*   < > 98* 153 131*   < > = values in this interval not displayed.   Lipid Panel: Recent Labs    01/28/18 0242  CHOL 106  HDL 23*  LDLCALC 51  TRIG 160*  CHOLHDL 4.6   TSH: Recent Labs    01/28/18 0242  TSH 0.787   A1C: Lab Results  Component Value Date   HGBA1C 5.1 01/28/2018     Assessment/Plan 1. Viral URI Reports he was exposed to COVID-19 and has noticed symptoms of cough, congestion, headache, fever and fatgiue since for ~1 week. Has started on mucinex by mouth twice daily  -using tylenol for comfort.  -encouraged to stay hydrated but not to exceed fluid restriction Will treat for acute bronchitis since symptoms have persisted for a week and could be bronchitis.  - doxycycline (VIBRA-TABS) 100 MG tablet; Take 1 tablet (100 mg total) by mouth 2 (two) times daily.  Dispense: 14 tablet; Refill: 0 -to use albuterol PRN (has not needed at this time) -educated wife and pt to continue to self quarantine. To watch and monitor temperature and to seek emergency care for life threatening symptoms.  -to continue to self quarantine anyone who has been in close contact (family) as well and continue to do so until at least 72 hours fever free.   2. ESRD (end stage renal disease) ([place]) States he has notified the dialysis center of his symptoms.   3. Asthma.  High risk of complications from any viral URI. Continue inhalers as prescribed and to use albuterol PRN. To closely monitor for worsening shortness of breath and seek emergency care if needed.   Carlos American. Harle Battiest  [facility] [phone]   Follow Up Instructions:    I discussed the assessment and treatment plan with the patient. The patient was provided an opportunity to ask questions and all were answered. The patient agreed with the plan and demonstrated an understanding of the instructions.   The patient was advised to call back or seek an in-person evaluation if the symptoms worsen or if the condition fails to improve as anticipated.  I provided 18 minutes of non-face-to-face time during this  encounter.  avs printed and mailed

## 2018-04-28 DIAGNOSIS — N186 End stage renal disease: Secondary | ICD-10-CM | POA: Diagnosis not present

## 2018-04-28 DIAGNOSIS — D509 Iron deficiency anemia, unspecified: Secondary | ICD-10-CM | POA: Diagnosis not present

## 2018-04-28 DIAGNOSIS — N2581 Secondary hyperparathyroidism of renal origin: Secondary | ICD-10-CM | POA: Diagnosis not present

## 2018-04-28 DIAGNOSIS — D631 Anemia in chronic kidney disease: Secondary | ICD-10-CM | POA: Diagnosis not present

## 2018-04-30 DIAGNOSIS — D509 Iron deficiency anemia, unspecified: Secondary | ICD-10-CM | POA: Diagnosis not present

## 2018-04-30 DIAGNOSIS — N186 End stage renal disease: Secondary | ICD-10-CM | POA: Diagnosis not present

## 2018-04-30 DIAGNOSIS — N2581 Secondary hyperparathyroidism of renal origin: Secondary | ICD-10-CM | POA: Diagnosis not present

## 2018-04-30 DIAGNOSIS — D631 Anemia in chronic kidney disease: Secondary | ICD-10-CM | POA: Diagnosis not present

## 2018-05-02 DIAGNOSIS — D631 Anemia in chronic kidney disease: Secondary | ICD-10-CM | POA: Diagnosis not present

## 2018-05-02 DIAGNOSIS — N2581 Secondary hyperparathyroidism of renal origin: Secondary | ICD-10-CM | POA: Diagnosis not present

## 2018-05-02 DIAGNOSIS — N186 End stage renal disease: Secondary | ICD-10-CM | POA: Diagnosis not present

## 2018-05-02 DIAGNOSIS — D509 Iron deficiency anemia, unspecified: Secondary | ICD-10-CM | POA: Diagnosis not present

## 2018-05-05 DIAGNOSIS — N186 End stage renal disease: Secondary | ICD-10-CM | POA: Diagnosis not present

## 2018-05-05 DIAGNOSIS — N2581 Secondary hyperparathyroidism of renal origin: Secondary | ICD-10-CM | POA: Diagnosis not present

## 2018-05-05 DIAGNOSIS — D631 Anemia in chronic kidney disease: Secondary | ICD-10-CM | POA: Diagnosis not present

## 2018-05-05 DIAGNOSIS — D509 Iron deficiency anemia, unspecified: Secondary | ICD-10-CM | POA: Diagnosis not present

## 2018-05-06 ENCOUNTER — Telehealth: Payer: Self-pay | Admitting: Internal Medicine

## 2018-05-06 NOTE — Telephone Encounter (Signed)
LMTCB. Please reschedule patient appt.

## 2018-05-07 DIAGNOSIS — D631 Anemia in chronic kidney disease: Secondary | ICD-10-CM | POA: Diagnosis not present

## 2018-05-07 DIAGNOSIS — N186 End stage renal disease: Secondary | ICD-10-CM | POA: Diagnosis not present

## 2018-05-07 DIAGNOSIS — N2581 Secondary hyperparathyroidism of renal origin: Secondary | ICD-10-CM | POA: Diagnosis not present

## 2018-05-07 DIAGNOSIS — D509 Iron deficiency anemia, unspecified: Secondary | ICD-10-CM | POA: Diagnosis not present

## 2018-05-07 NOTE — Telephone Encounter (Signed)
LMTCB- PLEASE RESCHEDULE APPT WHEN HE CALLS BACK

## 2018-05-09 DIAGNOSIS — N186 End stage renal disease: Secondary | ICD-10-CM | POA: Diagnosis not present

## 2018-05-09 DIAGNOSIS — D509 Iron deficiency anemia, unspecified: Secondary | ICD-10-CM | POA: Diagnosis not present

## 2018-05-09 DIAGNOSIS — D631 Anemia in chronic kidney disease: Secondary | ICD-10-CM | POA: Diagnosis not present

## 2018-05-09 DIAGNOSIS — N2581 Secondary hyperparathyroidism of renal origin: Secondary | ICD-10-CM | POA: Diagnosis not present

## 2018-05-09 NOTE — Telephone Encounter (Signed)
Attempted to call pt but unable to reach. Left message for pt to return call. 

## 2018-05-12 ENCOUNTER — Encounter: Payer: Self-pay | Admitting: Internal Medicine

## 2018-05-12 ENCOUNTER — Other Ambulatory Visit: Payer: Self-pay | Admitting: *Deleted

## 2018-05-12 DIAGNOSIS — N186 End stage renal disease: Secondary | ICD-10-CM | POA: Diagnosis not present

## 2018-05-12 DIAGNOSIS — D631 Anemia in chronic kidney disease: Secondary | ICD-10-CM | POA: Diagnosis not present

## 2018-05-12 DIAGNOSIS — D509 Iron deficiency anemia, unspecified: Secondary | ICD-10-CM | POA: Diagnosis not present

## 2018-05-12 DIAGNOSIS — N2581 Secondary hyperparathyroidism of renal origin: Secondary | ICD-10-CM | POA: Diagnosis not present

## 2018-05-12 NOTE — Telephone Encounter (Signed)
Attempted to call pt or wife Kirk [name] but unable to reach. Left message for them to return call. Due to multiple attempts trying to call pt but unable to reach. Per triage protocol message will be closed.

## 2018-05-12 NOTE — Patient Outreach (Signed)
[facility]) Care Management  05/12/2018  Kirk [name] 1961-01-08 [id number]    Referral: Screening Initial outreach: 05/12/2018  RN initial outreach attempt today was unsuccessful. RN able to leave a HIPAA approved voice message requesting a call back. Will send outreach letter and reschedule another call within 4 days for screening.  Raina Mina, RN Care Management Coordinator [place] Office [phone]

## 2018-05-14 ENCOUNTER — Telehealth: Payer: Self-pay | Admitting: Internal Medicine

## 2018-05-14 ENCOUNTER — Emergency Department (HOSPITAL_COMMUNITY)
Admission: EM | Admit: 2018-05-14 | Discharge: 2018-05-14 | Disposition: A | Discharge status: Home / Self Care | Payer: Medicare Other | Attending: Emergency Medicine | Admitting: Emergency Medicine

## 2018-05-14 ENCOUNTER — Emergency Department (HOSPITAL_COMMUNITY): Payer: Medicare Other

## 2018-05-14 ENCOUNTER — Other Ambulatory Visit: Payer: Self-pay

## 2018-05-14 DIAGNOSIS — D509 Iron deficiency anemia, unspecified: Secondary | ICD-10-CM | POA: Diagnosis not present

## 2018-05-14 DIAGNOSIS — J40 Bronchitis, not specified as acute or chronic: Secondary | ICD-10-CM | POA: Diagnosis not present

## 2018-05-14 DIAGNOSIS — J209 Acute bronchitis, unspecified: Secondary | ICD-10-CM | POA: Insufficient documentation

## 2018-05-14 DIAGNOSIS — Z79899 Other long term (current) drug therapy: Secondary | ICD-10-CM | POA: Insufficient documentation

## 2018-05-14 DIAGNOSIS — Z85828 Personal history of other malignant neoplasm of skin: Secondary | ICD-10-CM | POA: Insufficient documentation

## 2018-05-14 DIAGNOSIS — N186 End stage renal disease: Secondary | ICD-10-CM | POA: Diagnosis not present

## 2018-05-14 DIAGNOSIS — D631 Anemia in chronic kidney disease: Secondary | ICD-10-CM | POA: Diagnosis not present

## 2018-05-14 DIAGNOSIS — F17210 Nicotine dependence, cigarettes, uncomplicated: Secondary | ICD-10-CM | POA: Insufficient documentation

## 2018-05-14 DIAGNOSIS — Z7982 Long term (current) use of aspirin: Secondary | ICD-10-CM | POA: Diagnosis not present

## 2018-05-14 DIAGNOSIS — Z992 Dependence on renal dialysis: Secondary | ICD-10-CM | POA: Insufficient documentation

## 2018-05-14 DIAGNOSIS — R0602 Shortness of breath: Secondary | ICD-10-CM | POA: Diagnosis not present

## 2018-05-14 DIAGNOSIS — Z720 Tobacco use: Secondary | ICD-10-CM

## 2018-05-14 DIAGNOSIS — I120 Hypertensive chronic kidney disease with stage 5 chronic kidney disease or end stage renal disease: Secondary | ICD-10-CM | POA: Insufficient documentation

## 2018-05-14 DIAGNOSIS — Z20828 Contact with and (suspected) exposure to other viral communicable diseases: Secondary | ICD-10-CM | POA: Diagnosis not present

## 2018-05-14 DIAGNOSIS — N2581 Secondary hyperparathyroidism of renal origin: Secondary | ICD-10-CM | POA: Diagnosis not present

## 2018-05-14 LAB — CBC WITH DIFFERENTIAL/PLATELET
Abs Immature Granulocytes: 0.01 10*3/uL (ref 0.00–0.07)
Basophils Absolute: 0 10*3/uL (ref 0.0–0.1)
Basophils Relative: 1 %
Eosinophils Absolute: 0.1 10*3/uL (ref 0.0–0.5)
Eosinophils Relative: 1 %
HCT: 31 % — ABNORMAL LOW (ref 39.0–52.0)
Hemoglobin: 9.6 g/dL — ABNORMAL LOW (ref 13.0–17.0)
Immature Granulocytes: 0 %
Lymphocytes Relative: 13 %
Lymphs Abs: 0.6 10*3/uL — ABNORMAL LOW (ref 0.7–4.0)
MCH: 31.6 pg (ref 26.0–34.0)
MCHC: 31 g/dL (ref 30.0–36.0)
MCV: 102 fL — ABNORMAL HIGH (ref 80.0–100.0)
Monocytes Absolute: 0.4 10*3/uL (ref 0.1–1.0)
Monocytes Relative: 8 %
Neutro Abs: 3.5 10*3/uL (ref 1.7–7.7)
Neutrophils Relative %: 77 %
Platelets: 136 10*3/uL — ABNORMAL LOW (ref 150–400)
RBC: 3.04 MIL/uL — ABNORMAL LOW (ref 4.22–5.81)
RDW: 16.1 % — ABNORMAL HIGH (ref 11.5–15.5)
WBC: 4.6 10*3/uL (ref 4.0–10.5)
nRBC: 0 % (ref 0.0–0.2)

## 2018-05-14 LAB — TROPONIN I: Troponin I: <0.03 ng/mL (ref <0.03)

## 2018-05-14 LAB — BRAIN NATRIURETIC PEPTIDE: B Natriuretic Peptide: 1092 pg/mL — ABNORMAL HIGH (ref 0.0–100.0)

## 2018-05-14 LAB — BASIC METABOLIC PANEL
Anion gap: 13 (ref 5–15)
BUN: 14 mg/dL (ref 6–20)
CO2: 28 mmol/L (ref 22–32)
Calcium: 8.5 mg/dL — ABNORMAL LOW (ref 8.9–10.3)
Chloride: 99 mmol/L (ref 98–111)
Creatinine, Ser: 6.28 mg/dL — ABNORMAL HIGH (ref 0.61–1.24)
GFR calc Af Amer: 11 mL/min — ABNORMAL LOW (ref >60)
GFR calc non Af Amer: 9 mL/min — ABNORMAL LOW (ref >60)
Glucose, Bld: 152 mg/dL — ABNORMAL HIGH (ref 70–99)
Potassium: 3.3 mmol/L — ABNORMAL LOW (ref 3.5–5.1)
Sodium: 140 mmol/L (ref 135–145)

## 2018-05-14 LAB — SARS CORONAVIRUS 2 BY RT PCR (HOSPITAL ORDER, PERFORMED IN ~~LOC~~ HOSPITAL LAB): SARS Coronavirus 2: NEGATIVE

## 2018-05-14 MED ORDER — AZITHROMYCIN 250 MG PO TABS
500.0000 mg | ORAL_TABLET | Freq: Once | ORAL | Status: AC
  Administered 2018-05-14: 500 mg via ORAL
  Filled 2018-05-14: qty 2

## 2018-05-14 MED ORDER — AZITHROMYCIN 250 MG PO TABS: ORAL_TABLET | ORAL | 0 refills | Status: DC

## 2018-05-14 NOTE — ED Triage Notes (Signed)
Pt reports shortness of breath, fevers for the past month. Pt states that both occur intermittently. Pt also reports aching in his knees and lower back pain. Patient reports he was exposed to a COVID 19 positive patient at dialysis about a month ago. Pt is a MWF dialysis patient and reports that he did go to dialysis today.

## 2018-05-14 NOTE — Telephone Encounter (Signed)
Called and spoke with pt's wife Kirk [name].   Pt still having problems with fatigue, cognitive impairment, trouble breathing, and cough with white phlegm.  Pt was on doxy x7 days and pt has finished the abx. Pt is currently still taking mucinex but pt is still having problems.  Pt's symptoms have now been going on x1 month. Per Kirk [name], pt was exposed with COVID at dialysis center around 3/19.  Pt has tried taking mucinex cold and cough but that did not help with pt's symptoms. Pt is using rescue inhaler about twice a day and is also using his symbicort daily as prescribed.  Pt had a temp this morning of 99.2 and had a temp of 99.6 about 2.5 weeks ago and per Kirk [name], pt has been having chills.   Pt and wife Kirk [name] want to know recommendations to help with pt's symptoms. Dr. Melvyn Novas, please advise on this for pt.

## 2018-05-14 NOTE — Telephone Encounter (Signed)
Returned call and made [name] aware Dr. Melvyn Novas feels she should take patient to ER for eval and make them aware of possible COVID exposure so that he can be masked. She verbalized understanding. Nothing further needed at this time.

## 2018-05-14 NOTE — Telephone Encounter (Signed)
Can't manage this over the phone and since has covid exposure needs this eval first with swab which we don't have here so rec ER eval and let them know there is concern about covid-19 on arrival with mask in place

## 2018-05-14 NOTE — Discharge Instructions (Addendum)
Use your inhaler as needed.    Try to stop smoking.

## 2018-05-14 NOTE — ED Provider Notes (Signed)
[facility] EMERGENCY DEPARTMENT Provider Note   CSN: [id number] Arrival date & time: 05/14/18  1425    History   Chief Complaint Chief Complaint  Patient presents with   Shortness of Breath    HPI Kirk [name] is a 58 y.o. male.     Pt presents to the ED today with SOB.  He has had an intermittent fever.  Pt said he was exposed to Covid during dialysis on 3/19.  The pt had an e-visit with his doctor on 4/3 with URI sx.  He was put on doxycycline and albuterol prn.  Pt said he does not feel like he's any better.  He has been going to dialysis and did go today.  He has not been tested for Covid-19.      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)    Anxiety    Asthma    Carpal tunnel syndrome    Cellulitis and abscess of trunk    Cholelithiasis 07/13/2014   Chronic headaches    Debility, unspecified    Dermatophytosis of the body    Dysrhythmia    history of   Edema    End stage renal disease on dialysis [facility])    "MWF; [facility]" (10/21/2014)   Essential hypertension, benign    GERD (gastroesophageal reflux disease)    Gout, unspecified    HTN (hypertension)    Hypertrophy of prostate without urinary obstruction and other lower urinary tract symptoms (LUTS)    Hypotension, unspecified    Impotence of organic origin    Insomnia, unspecified    Kidney replaced by transplant    Localization-related (focal) (partial) epilepsy and epileptic syndromes with complex partial seizures, without mention of intractable epilepsy    Lumbago    Memory loss    OSA on CPAP    Other and unspecified hyperlipidemia    Other chronic nonalcoholic liver disease    Other malaise and fatigue    Other nonspecific abnormal serum enzyme levels    Pain in joint, lower  leg    Pain in joint, upper arm    Pneumonia "several times"   Renal dialysis status(V45.11) 02/05/2010   restarted 01/02/13 ofter renal trransplant failure   Secondary hyperparathyroidism (of renal origin)    Shortness of breath    Sleep apnea    Tension headache    Unspecified constipation    Unspecified essential hypertension    Unspecified hereditary and idiopathic peripheral neuropathy    Unspecified vitamin D deficiency     Patient Active Problem List   Diagnosis Date Noted   Elevated troponin 01/28/2018   Cough 01/27/2018   Cigarette smoker 07/11/2017   ESRD (end stage renal disease) ([place]) 05/30/2017   Anxiety 05/30/2017   HCAP (healthcare-associated pneumonia) 05/30/2017   Hypokalemia    Acute respiratory failure with hypoxia ([place]) 01/28/2017   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   Acute respiratory distress 10/31/2016   Shunt malfunction 07/03/2016   End stage renal disease ([place])    Need for hepatitis C screening test 06/29/2015   Fatigue 06/29/2015   Myalgia 05/17/2015   Acute dyspnea    Secondary central sleep apnea 12/09/2014   Obesity hypoventilation syndrome ([place]) 12/09/2014   Extrinsic asthma 12/09/2014   Narcotic drug use 12/09/2014   Hypersomnia with sleep apnea  12/09/2014   SCC (squamous cell carcinoma), arm 11/09/2014   Mild persistent chronic asthma without complication 64/40/3474   ESRD on dialysis ([place]) 09/09/2014   HLD (hyperlipidemia) 09/09/2014   Chest pain 09/09/2014   DOE (dyspnea on exertion) 06/23/2014   Pancytopenia ([place]) 04/28/2014   Thrombocytopenia ([place]) 04/10/2014   Fungal dermatitis 03/17/2014   Abdominal pain    History of surgical procedure 09/17/2013   Awaiting organ transplant 02/13/2013   HTN (hypertension)    Tension headache    Memory loss    Anemia in chronic renal disease    Edema    Thoracic or lumbosacral neuritis or radiculitis 03/27/2012   Nerve root pain 03/27/2012    History of kidney transplant 09/10/2011   Hearing loss 08/16/2011   Obesity 08/16/2011   Difficulty hearing 08/16/2011   OSA on CPAP 08/05/2011   GIB (gastrointestinal bleeding) 10/28/2007    Past Surgical History:  Procedure Laterality Date   AV FISTULA PLACEMENT Left ?2010   "forearm; at [place]"   [place]  03/21/2011   CHOLECYSTECTOMY N/A 10/21/2014   Procedure: LAPAROSCOPIC CHOLECYSTECTOMY WITH INTRAOPERATIVE CHOLANGIOGRAM;  Surgeon: Autumn Messing III, MD;  Location: [facility];  Service: General;  Laterality: N/A;   COLONOSCOPY     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   KIDNEY TRANSPLANT  08/17/2011   [place]  10/21/2014   w/IOC   LEFT HEART CATHETERIZATION WITH CORONARY ANGIOGRAM N/A 03/21/2011   Procedure: LEFT HEART CATHETERIZATION WITH CORONARY ANGIOGRAM;  Surgeon: [name], MD;  Location[facility] C[facility] LAB;  Service: Cardiovascular;  Laterality: N/A;   NEPHRECTOMY  08/2013   removed transplaned kidney   POSTERIOR FUSION CERVICAL SPINE  06/25/2012   for spinal stenosis   VASECTOMY  2010        Home Medications    Prior to Admission medications   Medication Sig Start Date End Date Taking? Authorizing Provider  acetaminophen (TYLENOL) 500 MG tablet Take 1 tablet (500 mg total) by mouth every 6 (six) hours as needed for moderate pain. 09/12/14  Yes Samuella Cota, MD  albuterol (PROVENTIL HFA;VENTOLIN HFA) 108 (90 Base) MCG/ACT inhaler Inhale 2 puffs into the lungs every 6 (six) hours as needed for wheezing or shortness of breath. 06/05/17  Yes Patrecia Pour, MD  aspirin EC 81 MG tablet Take 81 mg by mouth daily.   Yes [provider]  b complex-vitamin c-folic acid (NEPHRO-VITE) 0.8 MG TABS tablet TAKE 1 TABLET BY MOUTH EVERY DAY Patient taking differently: Take 1 tablet by mouth daily.  09/19/17  Yes Reed, Tiffany L, DO  budesonide-formoterol (SYMBICORT)  160-4.5 MCG/ACT inhaler Take 2 puffs first thing in am and then another 2 puffs about 12 hours later. Patient taking differently: Inhale 2 puffs into the lungs 2 (two) times a day.  10/02/17  Yes Tanda Rockers, MD  calcitRIOL (ROCALTROL) 0.25 MCG capsule Take 1 capsule (0.25 mcg total) by mouth every Monday, Wednesday, and Friday with hemodialysis. 09/21/14  Yes Bonnielee Haff, MD  Cholecalciferol (VITAMIN D3) 5000 UNITS TABS Take 1 tablet by mouth daily.   Yes [provider]  clonazePAM (KLONOPIN) 0.5 MG tablet TAKE 1 TABLET (0.5 MG TOTAL) BY MOUTH 2 (TWO) TIMES DAILY AS NEEDED FOR ANXIETY. 04/21/18  Yes Reed, Tiffany L, DO  diphenhydrAMINE (BENADRYL) 25 MG tablet Take 25 mg by mouth at bedtime as needed for allergies.   Yes  [provider]  guaiFENesin-dextromethorphan (ROBITUSSIN DM) 100-10 MG/5ML syrup Take 10 mLs by mouth every 4 (four) hours as needed for cough.   Yes [provider]  hydrALAZINE (APRESOLINE) 50 MG tablet Take 200 mg by mouth 2 (two) times a day.    Yes [provider]  metoprolol tartrate (LOPRESSOR) 50 MG tablet Take 1.5 tablets (75 mg total) by mouth 2 (two) times daily. Take One and a half tablet by mouth twice daily 10/25/17  Yes Reed, Tiffany L, DO  omeprazole (PRILOSEC) 20 MG capsule TAKE 1 CAPSULE TWICE DAILY BEFORE A MEAL 03/18/18  Yes Milus Banister, MD  ondansetron (ZOFRAN ODT) 4 MG disintegrating tablet Take 1 tablet (4 mg total) by mouth every 8 (eight) hours as needed for nausea or vomiting. 10/26/16  Yes Carmon Sails J, PA-C  pravastatin (PRAVACHOL) 40 MG tablet TAKE 1 TABLET EVERY EVENING Patient taking differently: Take 40 mg by mouth daily.  03/18/18  Yes Reed, Tiffany L, DO  sucroferric oxyhydroxide (VELPHORO) 500 MG chewable tablet Chew 500-1,500 mg by mouth See admin instructions. Take 1500 mg with meals and 500 mg with snacks   Yes [provider]  traMADol (ULTRAM) 50 MG tablet One every 4 hours as needed for  cough Patient taking differently: Take 50 mg by mouth every 6 (six) hours as needed for moderate pain (cough).  12/30/17  Yes Tanda Rockers, MD  azithromycin (ZITHROMAX) 250 MG tablet Take 1 every day until finished. 05/14/18   Isla Pence, MD    Family History Family History  Adopted: Yes  Problem Relation Age of Onset   Colon cancer Neg Hx    Esophageal cancer Neg Hx    Rectal cancer Neg Hx    Stomach cancer Neg Hx     Social History Social History   Tobacco Use   Smoking status: Current Every Day Smoker    Packs/day: 0.50    Years: 32.00    Pack years: 16.00    Types: Cigarettes   Smokeless tobacco: Never Used   Tobacco comment: 1 cigarette every couple days  Substance Use Topics   Alcohol use: No    Alcohol/week: 0.0 standard drinks   Drug use: No     Allergies   Codeine   Review of Systems Review of Systems  Constitutional: Positive for chills and fever.  Respiratory: Positive for shortness of breath.   All other systems reviewed and are negative.    Physical Exam Updated Vital Signs BP (!) 198/82    Pulse 78    Temp 98.5 F (36.9 C) (Oral)    Resp (!) 29    Ht 5\' 7"  (1.702 m)    Wt 101.2 kg    SpO2 98%    BMI 34.93 kg/m   Physical Exam Vitals signs and nursing note reviewed.  Constitutional:      Appearance: He is well-developed.  HENT:     Head: Normocephalic and atraumatic.     Mouth/Throat:     Mouth: Mucous membranes are moist.     Pharynx: Oropharynx is clear.  Eyes:     Extraocular Movements: Extraocular movements intact.     Pupils: Pupils are equal, round, and reactive to light.  Neck:     Musculoskeletal: Normal range of motion and neck supple.  Cardiovascular:     Rate and Rhythm: Normal rate and regular rhythm.  Pulmonary:     Effort: Pulmonary effort is normal.     Breath sounds: Normal breath  sounds.  Abdominal:     General: Bowel sounds are normal.     Palpations: Abdomen is soft.  Musculoskeletal: Normal range  of motion.  Skin:    General: Skin is warm and dry.     Capillary Refill: Capillary refill takes less than 2 seconds.  Neurological:     General: No focal deficit present.     Mental Status: He is alert and oriented to person, place, and time.  Psychiatric:        Mood and Affect: Mood normal.        Behavior: Behavior normal.      ED Treatments / Results  Labs (all labs ordered are listed, but only abnormal results are displayed) Labs Reviewed  BASIC METABOLIC PANEL - Abnormal; Notable for the following components:      Result Value   Potassium 3.3 (*)    Glucose, Bld 152 (*)    Creatinine, Ser 6.28 (*)    Calcium 8.5 (*)    GFR calc non Af Amer 9 (*)    GFR calc Af Amer 11 (*)    All other components within normal limits  CBC WITH DIFFERENTIAL/PLATELET - Abnormal; Notable for the following components:   RBC 3.04 (*)    Hemoglobin 9.6 (*)    HCT 31.0 (*)    MCV 102.0 (*)    RDW 16.1 (*)    Platelets 136 (*)    Lymphs Abs 0.6 (*)    All other components within normal limits  BRAIN NATRIURETIC PEPTIDE - Abnormal; Notable for the following components:   B Natriuretic Peptide 1,092.0 (*)    All other components within normal limits  SARS CORONAVIRUS 2 (HOSPITAL ORDER, [place])  TROPONIN I    EKG EKG Interpretation  Date/Time:  Wednesday May 14 2018 14:39:01 EDT Ventricular Rate:  76 PR Interval:    QRS Duration: 90 QT Interval:  452 QTC Calculation: 509 R Axis:   77 Text Interpretation:  Sinus rhythm Left ventricular hypertrophy ST depr, consider ischemia, inferior leads Prolonged QT interval No significant change since last tracing Confirmed by Isla Pence [phone]) on 05/14/2018 2:56:44 PM   Radiology Dg Chest Port 1 View  Result Date: 05/14/2018 CLINICAL DATA:  Fever and shortness of breath for 1 month EXAM: PORTABLE CHEST 1 VIEW COMPARISON:  01/27/2018 FINDINGS: Cardiac shadow is mildly prominent but stable. The lungs are  well aerated bilaterally. Previously seen vascular congestion is again noted with decreased interstitial change. No bony abnormality is noted. IMPRESSION: Stable vascular congestion. Electronically Signed   By: Inez Catalina M.D.   On: 05/14/2018 15:44    Procedures Procedures (including critical care time)  Medications Ordered in ED Medications  azithromycin (ZITHROMAX) tablet 500 mg (has no administration in time range)     Initial Impression / Assessment and Plan / ED Course  I have reviewed the triage vital signs and the nursing notes.  Pertinent labs & imaging results that were available during my care of the patient were reviewed by me and considered in my medical decision making (see chart for details).     Pt is negative for Covid-19.  He has no PNA.  He is oxygenating well.  He will be d/c home on zithromax.  He is encouraged to stop smoking.  Return if worse.  Kirk [name] was evaluated in Emergency Department on 05/14/2018 for the symptoms described in the history of present illness. He was evaluated in the context of  the global COVID-19 pandemic, which necessitated consideration that the patient might be at risk for infection with the SARS-CoV-2 virus that causes COVID-19. Institutional protocols and algorithms that pertain to the evaluation of patients at risk for COVID-19 are in a state of rapid change based on information released by regulatory bodies including the CDC and federal and state organizations. These policies and algorithms were followed during the patient's care in the ED.  Final Clinical Impressions(s) / ED Diagnoses   Final diagnoses:  Acute bronchitis, unspecified organism  ESRD on hemodialysis ([place])  Tobacco abuse    ED Discharge Orders         Ordered    azithromycin (ZITHROMAX) 250 MG tablet     05/14/18 1624           Isla Pence, MD 05/14/18 1625

## 2018-05-14 NOTE — Telephone Encounter (Signed)
Left message for patient's wife to call back.  

## 2018-05-15 ENCOUNTER — Other Ambulatory Visit: Payer: Self-pay | Admitting: *Deleted

## 2018-05-15 NOTE — Patient Outreach (Signed)
[facility]) Care Management  05/15/2018  Kirk [name] April 15, 1960 [id number]    UNSUCCESSFUL SCREENING REFERRAL  2nd outreach:  05/15/2018   Subjective: Initial unsuccessful telephone call to patient's preferred number in order to perform a screening. RN able to leave a HIPAA approved voice message requesting a call back.  Plan: Will reschedule another outreach call within the next week.   Raina Mina, RN Care Management Coordinator [place] Office [phone]

## 2018-05-16 ENCOUNTER — Other Ambulatory Visit: Payer: Self-pay | Admitting: Internal Medicine

## 2018-05-16 DIAGNOSIS — D631 Anemia in chronic kidney disease: Secondary | ICD-10-CM | POA: Diagnosis not present

## 2018-05-16 DIAGNOSIS — E785 Hyperlipidemia, unspecified: Secondary | ICD-10-CM

## 2018-05-16 DIAGNOSIS — D509 Iron deficiency anemia, unspecified: Secondary | ICD-10-CM | POA: Diagnosis not present

## 2018-05-16 DIAGNOSIS — N2581 Secondary hyperparathyroidism of renal origin: Secondary | ICD-10-CM | POA: Diagnosis not present

## 2018-05-16 DIAGNOSIS — N186 End stage renal disease: Secondary | ICD-10-CM | POA: Diagnosis not present

## 2018-05-16 NOTE — Telephone Encounter (Signed)
Opened in error; Disregard.

## 2018-05-19 DIAGNOSIS — N186 End stage renal disease: Secondary | ICD-10-CM | POA: Diagnosis not present

## 2018-05-19 DIAGNOSIS — D509 Iron deficiency anemia, unspecified: Secondary | ICD-10-CM | POA: Diagnosis not present

## 2018-05-19 DIAGNOSIS — N2581 Secondary hyperparathyroidism of renal origin: Secondary | ICD-10-CM | POA: Diagnosis not present

## 2018-05-19 DIAGNOSIS — D631 Anemia in chronic kidney disease: Secondary | ICD-10-CM | POA: Diagnosis not present

## 2018-05-21 ENCOUNTER — Other Ambulatory Visit: Payer: Self-pay | Admitting: *Deleted

## 2018-05-21 DIAGNOSIS — N2581 Secondary hyperparathyroidism of renal origin: Secondary | ICD-10-CM | POA: Diagnosis not present

## 2018-05-21 DIAGNOSIS — D509 Iron deficiency anemia, unspecified: Secondary | ICD-10-CM | POA: Diagnosis not present

## 2018-05-21 DIAGNOSIS — D631 Anemia in chronic kidney disease: Secondary | ICD-10-CM | POA: Diagnosis not present

## 2018-05-21 DIAGNOSIS — N186 End stage renal disease: Secondary | ICD-10-CM | POA: Diagnosis not present

## 2018-05-21 NOTE — Patient Outreach (Signed)
[facility]) Care Management  05/21/2018  Kirk [name] [birth year] [id number]    RN 3rd outreach attempt however unsuccessful. RN able to leave a HIPAA approved voice message requesting a call back. Outreach letter has also been sent if no response will plan to close this case.  Raina Mina, RN Care Management Coordinator [place] Office [phone]

## 2018-05-22 ENCOUNTER — Ambulatory Visit (INDEPENDENT_AMBULATORY_CARE_PROVIDER_SITE_OTHER): Payer: Medicare Other | Admitting: Internal Medicine

## 2018-05-22 ENCOUNTER — Other Ambulatory Visit: Payer: Self-pay

## 2018-05-22 ENCOUNTER — Encounter: Payer: Self-pay | Admitting: Internal Medicine

## 2018-05-22 DIAGNOSIS — Q8781 Alport syndrome: Secondary | ICD-10-CM | POA: Diagnosis not present

## 2018-05-22 DIAGNOSIS — J4540 Moderate persistent asthma, uncomplicated: Secondary | ICD-10-CM

## 2018-05-22 DIAGNOSIS — G8929 Other chronic pain: Secondary | ICD-10-CM

## 2018-05-22 DIAGNOSIS — G44209 Tension-type headache, unspecified, not intractable: Secondary | ICD-10-CM

## 2018-05-22 DIAGNOSIS — J069 Acute upper respiratory infection, unspecified: Secondary | ICD-10-CM | POA: Diagnosis not present

## 2018-05-22 DIAGNOSIS — M25562 Pain in left knee: Secondary | ICD-10-CM | POA: Diagnosis not present

## 2018-05-22 DIAGNOSIS — M25561 Pain in right knee: Secondary | ICD-10-CM

## 2018-05-22 DIAGNOSIS — N186 End stage renal disease: Secondary | ICD-10-CM

## 2018-05-22 DIAGNOSIS — E1121 Type 2 diabetes mellitus with diabetic nephropathy: Secondary | ICD-10-CM | POA: Diagnosis not present

## 2018-05-22 MED ORDER — TRAMADOL HCL 50 MG PO TABS: 50.0000 mg | ORAL_TABLET | Freq: Every day | ORAL | 0 refills | Status: DC | PRN

## 2018-05-22 NOTE — Patient Instructions (Addendum)
Continue to try to cut down on cigarettes.  Today you said a pack is lasting 3-4 days so by next week, try to make one last 5 days, and gradually stretch them out from there.  If you have not quit by the time we meet in August, we can try you on chantix then.  For your knee pain, try to do some walking to keep your joints lubricated.  Also, use tylenol first.  If you don't get relief from it, you may use your tramadol when it's severe.    Let us know if you have any changes in your sputum, fever, shortness of breath, or increased cough.

## 2018-05-22 NOTE — Progress Notes (Signed)
Patient ID: Kirk [name], male   DOB: 03/27/1960, 58 y.o.   MRN: [id number] This service is provided via telemedicine  No vital signs collected/recorded due to the encounter was a telemedicine visit.   Location of patient (ex: home, work):  HOME  Patient consents to a telephone visit:  YES  Location of the provider (ex: office, home):  OFFICE  Name of any referring provider:  DR [facility] Marjani Kobel DO  Names of all persons participating in the telemedicine service and their role in the encounter:  WIFE, PATIENT, DR [place], Edwin Dada, [place]  Time spent on call:  8:22    Provider:  Madeline Pho L. Mariea Clonts, D.O., C.M.D.  Goals of Care:  Advanced Directives 05/14/2018  Does Patient Have a Medical Advance Directive? No  Does patient want to make changes to medical advance directive? -  Would patient like information on creating a medical advance directive? No - Patient declined  Pre-existing out of facility DNR order (yellow form or pink MOST form) -   Chief Complaint  Patient presents with  . TELEPHONE VISIT    9mth follow-up/ follow-up after ER    HPI: Patient is a 58 y.o. male seen today for medical management of chronic diseases.    He still has some residual whistle in his chest.  He still coughs like crazy which is long-term.  He talks about getting hot or cold chills.  His nose runs in cold air and AC.  A fan over his head will make his nose.  He is wearing a mask everywhere he goes.  Uses his albuterol when he has wheezing.  He's still on mucinex 2 tabs twice a day.  He still smokes some--smokes a pack every 3-4 days.   Wants to quit.  Thought about chantix.  He's tried patches, gum, hypnosis but nothing worked.  He was on a pack a day before.  Always coughs up clear mucus.    He's not very energetic due to low iron.  They given him infusions for this.    Uses his cpap at hs.  He says the dog has gotten hold of it before.  He has a good cleaning system for it.    His knees are  real sore and he can hardly walk.  They did swell up at one time.  Has to get up with a cane or use braces on both knees.  Wondered if he had gout.  Hurts when straight or bent.    He is getting bad headaches after dialysis again.  Tylenol does not do the trick.    Past Medical History:  Diagnosis Date  . Acute edema of lung, unspecified   . Acute, but ill-defined, cerebrovascular disease   . Allergy   . Anemia   . 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)   . Anxiety   . Asthma   . Carpal tunnel syndrome   . Cellulitis and abscess of trunk   . Cholelithiasis 07/13/2014  . Chronic headaches   . Debility, unspecified   . Dermatophytosis of the body   . Dysrhythmia    history of  . Edema   . End stage renal disease on dialysis [facility])    "MWF; [facility] in [facility]" (10/21/2014)  . Essential hypertension, benign   . GERD (gastroesophageal reflux disease)   . Gout, unspecified   . HTN (hypertension)   . Hypertrophy of prostate without urinary obstruction and other lower urinary tract symptoms (LUTS)   . Hypotension,  unspecified   . Impotence of organic origin   . Insomnia, unspecified   . Kidney replaced by transplant   . Localization-related (focal) (partial) epilepsy and epileptic syndromes with complex partial seizures, without mention of intractable epilepsy   . Lumbago   . Memory loss   . OSA on CPAP   . Other and unspecified hyperlipidemia   . Other chronic nonalcoholic liver disease   . Other malaise and fatigue   . Other nonspecific abnormal serum enzyme levels   . Pain in joint, lower leg   . Pain in joint, upper arm   . Pneumonia "several times"  . Renal dialysis status(V45.11) 02/05/2010   restarted 01/02/13 ofter renal trransplant failure  . Secondary hyperparathyroidism (of renal origin)   . Shortness of breath   . Sleep apnea   . Tension headache   . Unspecified constipation   . Unspecified essential hypertension   . Unspecified hereditary and  idiopathic peripheral neuropathy   . Unspecified vitamin D deficiency     Past Surgical History:  Procedure Laterality Date  . AV FISTULA PLACEMENT Left ?2010   "forearm; at [facility]"  . BACK SURGERY    . CARDIAC CATHETERIZATION  03/21/2011  . CHOLECYSTECTOMY N/A 10/21/2014   Procedure: LAPAROSCOPIC CHOLECYSTECTOMY WITH INTRAOPERATIVE CHOLANGIOGRAM;  Surgeon: Autumn Messing III, MD;  Location: [place];  Service: General;  Laterality: N/A;  . COLONOSCOPY    . 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 KIDNEY TRANSPLANT  08/17/2011   [place]  10/21/2014   w/IOC  . LEFT HEART CATHETERIZATION WITH CORONARY ANGIOGRAM N/A 03/21/2011   Procedure: LEFT HEART CATHETERIZATION WITH CORONARY ANGIOGRAM;  Surgeon: [name], MD;  Location: [facility] CATH LAB;  Service: Cardiovascular;  Laterality: N/A;  . NEPHRECTOMY  08/2013   removed transplaned kidney  . POSTERIOR FUSION CERVICAL SPINE  06/25/2012   for spinal stenosis  . VASECTOMY  2010    Allergies  Allergen Reactions  . Codeine Nausea And Vomiting    Outpatient Encounter Medications as of 05/22/2018  Medication Sig  . acetaminophen (TYLENOL) 500 MG tablet Take 1 tablet (500 mg total) by mouth every 6 (six) hours as needed for moderate pain.  Marland Kitchen albuterol (PROVENTIL HFA;VENTOLIN HFA) 108 (90 Base) MCG/ACT inhaler Inhale 2 puffs into the lungs every 6 (six) hours as needed for wheezing or shortness of breath.  Marland Kitchen aspirin EC 81 MG tablet Take 81 mg by mouth daily.  Marland Kitchen b complex-vitamin c-folic acid (NEPHRO-VITE) 0.8 MG TABS tablet TAKE 1 TABLET BY MOUTH EVERY DAY  . budesonide-formoterol (SYMBICORT) 160-4.5 MCG/ACT inhaler Take 2 puffs first thing in am and then another 2 puffs about 12 hours later.  . calcitRIOL (ROCALTROL) 0.25 MCG capsule Take 1 capsule (0.25 mcg total) by mouth every Monday, Wednesday, and Friday with hemodialysis.  . Cholecalciferol (VITAMIN D3) 5000 UNITS TABS Take 1  tablet by mouth daily.  . clonazePAM (KLONOPIN) 0.5 MG tablet TAKE 1 TABLET (0.5 MG TOTAL) BY MOUTH 2 (TWO) TIMES DAILY AS NEEDED FOR ANXIETY.  . diphenhydrAMINE (BENADRYL) 25 MG tablet Take 25 mg by mouth at bedtime as needed for allergies.  Marland Kitchen guaiFENesin-dextromethorphan (ROBITUSSIN DM) 100-10 MG/5ML syrup Take 10 mLs by mouth every 4 (four) hours as needed for cough.  . hydrALAZINE (APRESOLINE) 100 MG tablet Take 100 mg by mouth 2 (two) times daily.  . metoprolol tartrate (LOPRESSOR) 50 MG tablet TAKE 1 AND 1/2 TABLETS  TWICE DAILY  . omeprazole (PRILOSEC) 20 MG capsule TAKE 1 CAPSULE TWICE DAILY BEFORE A MEAL  . ondansetron (ZOFRAN ODT) 4 MG disintegrating tablet Take 1 tablet (4 mg total) by mouth every 8 (eight) hours as needed for nausea or vomiting.  . pravastatin (PRAVACHOL) 40 MG tablet TAKE 1 TABLET EVERY EVENING  . sucroferric oxyhydroxide (VELPHORO) 500 MG chewable tablet Chew 500-1,500 mg by mouth See admin instructions. Take 1500 mg with meals and 500 mg with snacks  . traMADol (ULTRAM) 50 MG tablet One every 4 hours as needed for cough  . [DISCONTINUED] azithromycin (ZITHROMAX) 250 MG tablet Take 1 every day until finished.  . [DISCONTINUED] hydrALAZINE (APRESOLINE) 50 MG tablet Take 200 mg by mouth 2 (two) times a day.    No facility-administered encounter medications on file as of 05/22/2018.     Review of Systems:  Review of Systems  Constitutional: Positive for malaise/fatigue. Negative for chills and fever.  HENT: Positive for hearing loss. Negative for congestion.   Eyes: Negative for blurred vision.       Glasses  Respiratory: Positive for cough, sputum production and wheezing. Negative for shortness of breath.   Cardiovascular: Negative for chest pain, palpitations and leg swelling.  Gastrointestinal: Negative for abdominal pain, blood in stool, constipation, diarrhea and melena.  Musculoskeletal: Negative for falls and joint pain.  Skin: Negative for itching and  rash.    Health Maintenance  Topic Date Due  . OPHTHALMOLOGY EXAM  01/29/1972  . FOOT EXAM  04/29/2014  . TETANUS/TDAP  01/23/2016  . HEMOGLOBIN A1C  07/29/2018  . INFLUENZA VACCINE  08/23/2018  . COLONOSCOPY  08/30/2019  . PNEUMOCOCCAL POLYSACCHARIDE VACCINE AGE 65-64 HIGH RISK  Completed  . Hepatitis C Screening  Completed  . HIV Screening  Completed    Physical Exam: Could not be performed as visit non face-to-face via phone   Labs reviewed: Basic Metabolic Panel: Recent Labs    05/31/17 0313  01/28/18 0242  01/29/18 0655 02/03/18 1523 05/14/18 1453  NA 138   < >  --    < > 138 140 140  K 4.3   < >  --    < > 4.6 4.0 3.3*  CL 99*   < >  --    < > 99 99 99  CO2 26   < >  --    < > 30 29 28   GLUCOSE 109*   < >  --    < > 128* 164* 152*  BUN 25*   < >  --    < > 20 43* 14  CREATININE 8.51*   < >  --    < > 7.98* 8.37* 6.28*  CALCIUM 8.6*   < >  --    < > 9.8 8.4* 8.5*  MG  --   --   --   --  2.1  --   --   PHOS 5.3*  --   --   --   --   --   --   TSH  --   --  0.787  --   --   --   --    < > = values in this interval not displayed.   Liver Function Tests: Recent Labs    05/30/17 1448 05/31/17 0313 06/28/17 1706  AST 21  --  19  ALT 22  --  21  ALKPHOS 55  --  74  BILITOT 0.8  --  0.8  PROT 6.7  --  8.0  ALBUMIN 3.5 3.4* 3.9   Recent Labs    06/28/17 1706  LIPASE 94*   No results for input(s): AMMONIA in the last 8760 hours. CBC: Recent Labs    01/29/18 0655 02/03/18 1523 05/14/18 1453  WBC 4.8 5.8 4.6  NEUTROABS 3.8 4,043 3.5  HGB 9.2* 8.5* 9.6*  HCT 28.8* 25.3* 31.0*  MCV 101.1* 97.7 102.0*  PLT 153 131* 136*   Lipid Panel: Recent Labs    01/28/18 0242  CHOL 106  HDL 23*  LDLCALC 51  TRIG 160*  CHOLHDL 4.6   Lab Results  Component Value Date   HGBA1C 5.1 01/28/2018    Procedures since last visit: Dg Chest Port 1 View  Result Date: 05/14/2018 CLINICAL DATA:  Fever and shortness of breath for 1 month EXAM: PORTABLE CHEST 1 VIEW  COMPARISON:  01/27/2018 FINDINGS: Cardiac shadow is mildly prominent but stable. The lungs are well aerated bilaterally. Previously seen vascular congestion is again noted with decreased interstitial change. No bony abnormality is noted. IMPRESSION: Stable vascular congestion. Electronically Signed   By: Inez Catalina M.D.   On: 05/14/2018 15:44    Assessment/Plan 1. Viral URI -sounds like he's better from this, only coughing up clear sputum which he's been telling me he does for several years  2. ESRD (end stage renal disease) ([place]) -continues on regular HD -masks are provided there for the patients  3. Moderate persistent asthma without complication -cont prn albuterol, working on smoking cessation, regular symbicort use, mucinex, and robitussin  4. Chronic pain of both knees -suspect OA vs pseudogout (reports had some inflammation), will plan to do xrays when imaging centers open back up for routine things -cont his tramadol when pain severe, but tylenol for mild to moderate pain to be used first  5. 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 Type 2 diabetes mellitus with diabetic nephropathy, without long-term current use of insulin (HCC) -has been well controlled w/o meds (on HD) Lab Results  Component Value Date   HGBA1C 5.1 01/28/2018  -continues with asa  7. Tension headache - reportedly he used tramadol for this when Dr Nyoka Cowden saw him--I only ordered it for his knee pain until we can evaluate it in person - traMADol (ULTRAM) 50 MG tablet; Take 1 tablet (50 mg total) by mouth daily as needed.  Dispense: 30 tablet; Refill: 0  Labs/tests ordered:  No new Next appt:  07/01/2018 Non face-to-face time spent on televisit:  27 minutes Domonic Kimball L. Loi Rennaker, D.O. [facility] [address], [place] [address] Cell Phone (Mon-Fri 8am-5pm):  [phone] On Call:  [phone] &  follow prompts after 5pm & weekends Office Phone:  [phone] Office Fax:  [phone]

## 2018-05-23 ENCOUNTER — Other Ambulatory Visit: Payer: Self-pay | Admitting: *Deleted

## 2018-05-23 DIAGNOSIS — N2581 Secondary hyperparathyroidism of renal origin: Secondary | ICD-10-CM | POA: Diagnosis not present

## 2018-05-23 DIAGNOSIS — E8779 Other fluid overload: Secondary | ICD-10-CM | POA: Diagnosis not present

## 2018-05-23 DIAGNOSIS — N186 End stage renal disease: Secondary | ICD-10-CM | POA: Diagnosis not present

## 2018-05-23 DIAGNOSIS — Z992 Dependence on renal dialysis: Secondary | ICD-10-CM | POA: Diagnosis not present

## 2018-05-23 DIAGNOSIS — D631 Anemia in chronic kidney disease: Secondary | ICD-10-CM | POA: Diagnosis not present

## 2018-05-23 DIAGNOSIS — T8612 Kidney transplant failure: Secondary | ICD-10-CM | POA: Diagnosis not present

## 2018-05-23 DIAGNOSIS — D509 Iron deficiency anemia, unspecified: Secondary | ICD-10-CM | POA: Diagnosis not present

## 2018-05-23 NOTE — Patient Outreach (Signed)
[place] [facility]) Care Management  05/23/2018  Kirk [name] 31-Oct-1960 [id number]    Referral [facility]-closed unable to contact  Due to unsuccessful outreach calls and no response to the outreach letter this case will be closed. RN will notify the provider of pt's disposition with [facility] services.  Raina Mina, RN Care Management Coordinator [place] Office [phone]

## 2018-05-24 DIAGNOSIS — N2581 Secondary hyperparathyroidism of renal origin: Secondary | ICD-10-CM | POA: Diagnosis not present

## 2018-05-24 DIAGNOSIS — D509 Iron deficiency anemia, unspecified: Secondary | ICD-10-CM | POA: Diagnosis not present

## 2018-05-24 DIAGNOSIS — D631 Anemia in chronic kidney disease: Secondary | ICD-10-CM | POA: Diagnosis not present

## 2018-05-24 DIAGNOSIS — E8779 Other fluid overload: Secondary | ICD-10-CM | POA: Diagnosis not present

## 2018-05-24 DIAGNOSIS — N186 End stage renal disease: Secondary | ICD-10-CM | POA: Diagnosis not present

## 2018-05-26 DIAGNOSIS — E8779 Other fluid overload: Secondary | ICD-10-CM | POA: Diagnosis not present

## 2018-05-26 DIAGNOSIS — D509 Iron deficiency anemia, unspecified: Secondary | ICD-10-CM | POA: Diagnosis not present

## 2018-05-26 DIAGNOSIS — D631 Anemia in chronic kidney disease: Secondary | ICD-10-CM | POA: Diagnosis not present

## 2018-05-26 DIAGNOSIS — N186 End stage renal disease: Secondary | ICD-10-CM | POA: Diagnosis not present

## 2018-05-26 DIAGNOSIS — N2581 Secondary hyperparathyroidism of renal origin: Secondary | ICD-10-CM | POA: Diagnosis not present

## 2018-05-27 DIAGNOSIS — Z992 Dependence on renal dialysis: Secondary | ICD-10-CM | POA: Diagnosis not present

## 2018-05-27 DIAGNOSIS — N186 End stage renal disease: Secondary | ICD-10-CM | POA: Diagnosis not present

## 2018-05-27 DIAGNOSIS — I871 Compression of vein: Secondary | ICD-10-CM | POA: Diagnosis not present

## 2018-05-28 DIAGNOSIS — N2581 Secondary hyperparathyroidism of renal origin: Secondary | ICD-10-CM | POA: Diagnosis not present

## 2018-05-28 DIAGNOSIS — E8779 Other fluid overload: Secondary | ICD-10-CM | POA: Diagnosis not present

## 2018-05-28 DIAGNOSIS — N186 End stage renal disease: Secondary | ICD-10-CM | POA: Diagnosis not present

## 2018-05-28 DIAGNOSIS — D509 Iron deficiency anemia, unspecified: Secondary | ICD-10-CM | POA: Diagnosis not present

## 2018-05-28 DIAGNOSIS — D631 Anemia in chronic kidney disease: Secondary | ICD-10-CM | POA: Diagnosis not present

## 2018-05-29 ENCOUNTER — Other Ambulatory Visit: Payer: Self-pay | Admitting: Internal Medicine

## 2018-05-29 DIAGNOSIS — N186 End stage renal disease: Secondary | ICD-10-CM

## 2018-05-29 DIAGNOSIS — Z992 Dependence on renal dialysis: Principal | ICD-10-CM

## 2018-05-30 DIAGNOSIS — E8779 Other fluid overload: Secondary | ICD-10-CM | POA: Diagnosis not present

## 2018-05-30 DIAGNOSIS — N2581 Secondary hyperparathyroidism of renal origin: Secondary | ICD-10-CM | POA: Diagnosis not present

## 2018-05-30 DIAGNOSIS — N186 End stage renal disease: Secondary | ICD-10-CM | POA: Diagnosis not present

## 2018-05-30 DIAGNOSIS — D631 Anemia in chronic kidney disease: Secondary | ICD-10-CM | POA: Diagnosis not present

## 2018-05-30 DIAGNOSIS — D509 Iron deficiency anemia, unspecified: Secondary | ICD-10-CM | POA: Diagnosis not present

## 2018-06-01 ENCOUNTER — Encounter (HOSPITAL_COMMUNITY): Payer: Self-pay

## 2018-06-01 ENCOUNTER — Observation Stay (HOSPITAL_COMMUNITY)
Admission: EM | Admit: 2018-06-01 | Discharge: 2018-06-03 | Disposition: A | Discharge status: Home / Self Care | Payer: Medicare Other | Attending: Internal Medicine | Admitting: Internal Medicine

## 2018-06-01 ENCOUNTER — Other Ambulatory Visit: Payer: Self-pay

## 2018-06-01 ENCOUNTER — Emergency Department (HOSPITAL_COMMUNITY): Payer: Medicare Other

## 2018-06-01 DIAGNOSIS — R7989 Other specified abnormal findings of blood chemistry: Secondary | ICD-10-CM | POA: Diagnosis not present

## 2018-06-01 DIAGNOSIS — W541XXA Struck by dog, initial encounter: Secondary | ICD-10-CM | POA: Insufficient documentation

## 2018-06-01 DIAGNOSIS — Z79899 Other long term (current) drug therapy: Secondary | ICD-10-CM | POA: Insufficient documentation

## 2018-06-01 DIAGNOSIS — D631 Anemia in chronic kidney disease: Secondary | ICD-10-CM | POA: Insufficient documentation

## 2018-06-01 DIAGNOSIS — N186 End stage renal disease: Secondary | ICD-10-CM | POA: Diagnosis not present

## 2018-06-01 DIAGNOSIS — K219 Gastro-esophageal reflux disease without esophagitis: Secondary | ICD-10-CM | POA: Insufficient documentation

## 2018-06-01 DIAGNOSIS — Z992 Dependence on renal dialysis: Secondary | ICD-10-CM | POA: Insufficient documentation

## 2018-06-01 DIAGNOSIS — R0602 Shortness of breath: Secondary | ICD-10-CM | POA: Diagnosis not present

## 2018-06-01 DIAGNOSIS — M25561 Pain in right knee: Secondary | ICD-10-CM | POA: Diagnosis present

## 2018-06-01 DIAGNOSIS — I120 Hypertensive chronic kidney disease with stage 5 chronic kidney disease or end stage renal disease: Secondary | ICD-10-CM | POA: Insufficient documentation

## 2018-06-01 DIAGNOSIS — M25461 Effusion, right knee: Secondary | ICD-10-CM | POA: Diagnosis not present

## 2018-06-01 DIAGNOSIS — Z981 Arthrodesis status: Secondary | ICD-10-CM | POA: Insufficient documentation

## 2018-06-01 DIAGNOSIS — F17210 Nicotine dependence, cigarettes, uncomplicated: Secondary | ICD-10-CM | POA: Insufficient documentation

## 2018-06-01 DIAGNOSIS — Z1159 Encounter for screening for other viral diseases: Secondary | ICD-10-CM | POA: Diagnosis not present

## 2018-06-01 DIAGNOSIS — M109 Gout, unspecified: Secondary | ICD-10-CM | POA: Diagnosis not present

## 2018-06-01 DIAGNOSIS — S8991XA Unspecified injury of right lower leg, initial encounter: Secondary | ICD-10-CM | POA: Diagnosis not present

## 2018-06-01 DIAGNOSIS — E785 Hyperlipidemia, unspecified: Secondary | ICD-10-CM | POA: Insufficient documentation

## 2018-06-01 DIAGNOSIS — N2581 Secondary hyperparathyroidism of renal origin: Secondary | ICD-10-CM | POA: Insufficient documentation

## 2018-06-01 DIAGNOSIS — Z9989 Dependence on other enabling machines and devices: Secondary | ICD-10-CM

## 2018-06-01 DIAGNOSIS — F419 Anxiety disorder, unspecified: Secondary | ICD-10-CM | POA: Insufficient documentation

## 2018-06-01 DIAGNOSIS — Z7982 Long term (current) use of aspirin: Secondary | ICD-10-CM | POA: Insufficient documentation

## 2018-06-01 DIAGNOSIS — Z7951 Long term (current) use of inhaled steroids: Secondary | ICD-10-CM | POA: Insufficient documentation

## 2018-06-01 DIAGNOSIS — G4733 Obstructive sleep apnea (adult) (pediatric): Secondary | ICD-10-CM | POA: Diagnosis not present

## 2018-06-01 DIAGNOSIS — R600 Localized edema: Secondary | ICD-10-CM | POA: Diagnosis not present

## 2018-06-01 DIAGNOSIS — I517 Cardiomegaly: Secondary | ICD-10-CM | POA: Diagnosis not present

## 2018-06-01 DIAGNOSIS — Z20828 Contact with and (suspected) exposure to other viral communicable diseases: Secondary | ICD-10-CM | POA: Diagnosis not present

## 2018-06-01 DIAGNOSIS — J45901 Unspecified asthma with (acute) exacerbation: Secondary | ICD-10-CM | POA: Diagnosis present

## 2018-06-01 DIAGNOSIS — S82201A Unspecified fracture of shaft of right tibia, initial encounter for closed fracture: Secondary | ICD-10-CM | POA: Diagnosis present

## 2018-06-01 DIAGNOSIS — I10 Essential (primary) hypertension: Secondary | ICD-10-CM | POA: Diagnosis present

## 2018-06-01 LAB — COMPREHENSIVE METABOLIC PANEL
ALT: 19 U/L (ref 0–44)
AST: 24 U/L (ref 15–41)
Albumin: 3.6 g/dL (ref 3.5–5.0)
Alkaline Phosphatase: 59 U/L (ref 38–126)
Anion gap: 16 — ABNORMAL HIGH (ref 5–15)
BUN: 34 mg/dL — ABNORMAL HIGH (ref 6–20)
CO2: 24 mmol/L (ref 22–32)
Calcium: 9.1 mg/dL (ref 8.9–10.3)
Chloride: 102 mmol/L (ref 98–111)
Creatinine, Ser: 13.79 mg/dL — ABNORMAL HIGH (ref 0.61–1.24)
GFR calc Af Amer: 4 mL/min — ABNORMAL LOW (ref >60)
GFR calc non Af Amer: 4 mL/min — ABNORMAL LOW (ref >60)
Glucose, Bld: 98 mg/dL (ref 70–99)
Potassium: 4.1 mmol/L (ref 3.5–5.1)
Sodium: 142 mmol/L (ref 135–145)
Total Bilirubin: 1.5 mg/dL — ABNORMAL HIGH (ref 0.3–1.2)
Total Protein: 6.8 g/dL (ref 6.5–8.1)

## 2018-06-01 LAB — CBC WITH DIFFERENTIAL/PLATELET
Abs Immature Granulocytes: 0.02 10*3/uL (ref 0.00–0.07)
Basophils Absolute: 0 10*3/uL (ref 0.0–0.1)
Basophils Relative: 1 %
Eosinophils Absolute: 0.1 10*3/uL (ref 0.0–0.5)
Eosinophils Relative: 1 %
HCT: 31.9 % — ABNORMAL LOW (ref 39.0–52.0)
Hemoglobin: 10 g/dL — ABNORMAL LOW (ref 13.0–17.0)
Immature Granulocytes: 0 %
Lymphocytes Relative: 20 %
Lymphs Abs: 1 10*3/uL (ref 0.7–4.0)
MCH: 31.4 pg (ref 26.0–34.0)
MCHC: 31.3 g/dL (ref 30.0–36.0)
MCV: 100.3 fL — ABNORMAL HIGH (ref 80.0–100.0)
Monocytes Absolute: 0.4 10*3/uL (ref 0.1–1.0)
Monocytes Relative: 8 %
Neutro Abs: 3.5 10*3/uL (ref 1.7–7.7)
Neutrophils Relative %: 70 %
Platelets: 101 10*3/uL — ABNORMAL LOW (ref 150–400)
RBC: 3.18 MIL/uL — ABNORMAL LOW (ref 4.22–5.81)
RDW: 16.2 % — ABNORMAL HIGH (ref 11.5–15.5)
WBC: 5.1 10*3/uL (ref 4.0–10.5)
nRBC: 0 % (ref 0.0–0.2)

## 2018-06-01 LAB — TROPONIN I: Troponin I: 0.03 ng/mL (ref <0.03)

## 2018-06-01 MED ORDER — CHLORHEXIDINE GLUCONATE CLOTH 2 % EX PADS
6.0000 | MEDICATED_PAD | Freq: Every day | CUTANEOUS | Status: DC
  Administered 2018-06-03: 6 via TOPICAL

## 2018-06-01 MED ORDER — CALCITRIOL 0.25 MCG PO CAPS
1.0000 ug | ORAL_CAPSULE | ORAL | Status: DC
  Administered 2018-06-02: 1 ug via ORAL
  Filled 2018-06-01: qty 4

## 2018-06-01 MED ORDER — SUCROFERRIC OXYHYDROXIDE 500 MG PO CHEW
1000.0000 mg | CHEWABLE_TABLET | Freq: Three times a day (TID) | ORAL | Status: DC
  Administered 2018-06-02 – 2018-06-03 (×2): 1000 mg via ORAL
  Filled 2018-06-01 (×6): qty 2

## 2018-06-01 NOTE — ED Provider Notes (Signed)
[facility] EMERGENCY DEPARTMENT Provider Note   CSN: [id number] Arrival date & time: 06/01/18  2015    History   Chief Complaint Chief Complaint  Patient presents with  . Shortness of Breath    HPI Kirk [name] is a 58 y.o. male.     The history is provided by the patient and medical records. No language interpreter was used.  Shortness of Breath  Severity:  Severe Onset quality:  Gradual Duration:  1 day Timing:  Constant Progression:  Worsening Chronicity:  Recurrent Relieved by:  Nothing Worsened by:  Nothing Ineffective treatments:  None tried Associated symptoms: cough (chronic)   Associated symptoms: no abdominal pain, no chest pain, no diaphoresis, no fever, no headaches, no neck pain, no sputum production, no vomiting and no wheezing     Past Medical History:  Diagnosis Date  . Acute edema of lung, unspecified   . Acute, but ill-defined, cerebrovascular disease   . Allergy   . Anemia   . 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)   . Anxiety   . Asthma   . Carpal tunnel syndrome   . Cellulitis and abscess of trunk   . Cholelithiasis 07/13/2014  . Chronic headaches   . Debility, unspecified   . Dermatophytosis of the body   . Dysrhythmia    history of  . Edema   . End stage renal disease on dialysis [facility])    "MWF; [facility]" (10/21/2014)  . Essential hypertension, benign   . GERD (gastroesophageal reflux disease)   . Gout, unspecified   . HTN (hypertension)   . Hypertrophy of prostate without urinary obstruction and other lower urinary tract symptoms (LUTS)   . Hypotension, unspecified   . Impotence of organic origin   . Insomnia, unspecified   . Kidney replaced by transplant   . Localization-related (focal) (partial) epilepsy and epileptic syndromes with complex partial seizures, without mention of intractable epilepsy   . Lumbago   . Memory loss   . OSA on CPAP   . Other and unspecified hyperlipidemia    . Other chronic nonalcoholic liver disease   . Other malaise and fatigue   . Other nonspecific abnormal serum enzyme levels   . Pain in joint, lower leg   . Pain in joint, upper arm   . Pneumonia "several times"  . Renal dialysis status(V45.11) 02/05/2010   restarted 01/02/13 ofter renal trransplant failure  . Secondary hyperparathyroidism (of renal origin)   . Shortness of breath   . Sleep apnea   . Tension headache   . Unspecified constipation   . Unspecified essential hypertension   . Unspecified hereditary and idiopathic peripheral neuropathy   . Unspecified vitamin D deficiency     Patient Active Problem List   Diagnosis Date Noted  . Elevated troponin 01/28/2018  . Cough 01/27/2018  . Cigarette smoker 07/11/2017  . ESRD (end stage renal disease) ([place]) 05/30/2017  . Anxiety 05/30/2017  . HCAP (healthcare-associated pneumonia) 05/30/2017  . Hypokalemia   . Acute respiratory failure with hypoxia ([place]) 01/28/2017  . 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  . Acute respiratory distress 10/31/2016  . Shunt malfunction 07/03/2016  . End stage renal disease ([place])   . Need for hepatitis C screening test 06/29/2015  . Fatigue 06/29/2015  . Myalgia 05/17/2015  . Acute dyspnea   . 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 Obesity hypoventilation syndrome ([place]) 12/09/2014  . Extrinsic asthma 12/09/2014  . Narcotic drug use 12/09/2014  . Hypersomnia  with sleep apnea 12/09/2014  . SCC (squamous cell carcinoma), arm 11/09/2014  . Mild persistent chronic asthma without complication 45/80/9983  . ESRD on dialysis ([place]) 09/09/2014  . HLD (hyperlipidemia) 09/09/2014  . Chest pain 09/09/2014  . DOE (dyspnea on exertion) 06/23/2014  . Pancytopenia ([place]) 04/28/2014  . Thrombocytopenia ([place]) 04/10/2014  . Fungal dermatitis 03/17/2014  . Abdominal pain   . History of surgical procedure 09/17/2013  . Awaiting organ transplant 02/13/2013  . HTN (hypertension)   . Tension headache   . Memory  loss   . Anemia in chronic renal disease   . Edema   . Thoracic or lumbosacral neuritis or radiculitis 03/27/2012  . Nerve root pain 03/27/2012  . History of kidney transplant 09/10/2011  . Hearing loss 08/16/2011  . Obesity 08/16/2011  . Difficulty hearing 08/16/2011  . OSA on CPAP 08/05/2011  . GIB (gastrointestinal bleeding) 10/28/2007    Past Surgical History:  Procedure Laterality Date  . AV FISTULA PLACEMENT Left ?2010   "forearm; at [facility]"  . BACK SURGERY    . CARDIAC CATHETERIZATION  03/21/2011  . CHOLECYSTECTOMY N/A 10/21/2014   Procedure: LAPAROSCOPIC CHOLECYSTECTOMY WITH INTRAOPERATIVE CHOLANGIOGRAM;  Surgeon: Autumn Messing III, MD;  Location: [facility];  Service: General;  Laterality: N/A;  . COLONOSCOPY    . 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 KIDNEY TRANSPLANT  08/17/2011   [facility]  10/21/2014   w/IOC  . LEFT HEART CATHETERIZATION WITH CORONARY ANGIOGRAM N/A 03/21/2011   Procedure: LEFT HEART CATHETERIZATION WITH CORONARY ANGIOGRAM;  Surgeon: [name], MD;  Location: [facility] CATH LAB;  Service: Cardiovascular;  Laterality: N/A;  . NEPHRECTOMY  08/2013   removed transplaned kidney  . POSTERIOR FUSION CERVICAL SPINE  06/25/2012   for spinal stenosis  . VASECTOMY  2010        Home Medications    Prior to Admission medications   Medication Sig Start Date End Date Taking? Authorizing Provider  acetaminophen (TYLENOL) 500 MG tablet Take 1 tablet (500 mg total) by mouth every 6 (six) hours as needed for moderate pain. 09/12/14   Samuella Cota, MD  albuterol (PROVENTIL HFA;VENTOLIN HFA) 108 (90 Base) MCG/ACT inhaler Inhale 2 puffs into the lungs every 6 (six) hours as needed for wheezing or shortness of breath. 06/05/17   Patrecia Pour, MD  aspirin EC 81 MG tablet Take 81 mg by mouth daily.    [provider]  b complex-vitamin c-folic acid (NEPHRO-VITE) 0.8 MG TABS tablet TAKE 1 TABLET BY  MOUTH EVERY DAY 05/29/18   Reed, Tiffany L, DO  budesonide-formoterol (SYMBICORT) 160-4.5 MCG/ACT inhaler Take 2 puffs first thing in am and then another 2 puffs about 12 hours later. 10/02/17   Tanda Rockers, MD  calcitRIOL (ROCALTROL) 0.25 MCG capsule Take 1 capsule (0.25 mcg total) by mouth every Monday, Wednesday, and Friday with hemodialysis. 09/21/14   Bonnielee Haff, MD  Cholecalciferol (VITAMIN D3) 5000 UNITS TABS Take 1 tablet by mouth daily.    [provider]  clonazePAM (KLONOPIN) 0.5 MG tablet TAKE 1 TABLET (0.5 MG TOTAL) BY MOUTH 2 (TWO) TIMES DAILY AS NEEDED FOR ANXIETY. 04/21/18   Reed, Tiffany L, DO  diphenhydrAMINE (BENADRYL) 25 MG tablet Take 25 mg by mouth at bedtime as needed for allergies.    [provider]  guaiFENesin-dextromethorphan (ROBITUSSIN DM) 100-10 MG/5ML syrup Take 10 mLs by mouth every 4 (four) hours as needed for  cough.    [provider]  hydrALAZINE (APRESOLINE) 100 MG tablet Take 100 mg by mouth 2 (two) times daily.    [provider]  metoprolol tartrate (LOPRESSOR) 50 MG tablet TAKE 1 AND 1/2 TABLETS     TWICE DAILY 05/16/18   Reed, Tiffany L, DO  omeprazole (PRILOSEC) 20 MG capsule TAKE 1 CAPSULE TWICE DAILY BEFORE A MEAL 03/18/18   Milus Banister, MD  ondansetron (ZOFRAN ODT) 4 MG disintegrating tablet Take 1 tablet (4 mg total) by mouth every 8 (eight) hours as needed for nausea or vomiting. 10/26/16   Kinnie Feil, PA-C  pravastatin (PRAVACHOL) 40 MG tablet TAKE 1 TABLET EVERY EVENING 05/16/18   Reed, Tiffany L, DO  sucroferric oxyhydroxide (VELPHORO) 500 MG chewable tablet Chew 500-1,500 mg by mouth See admin instructions. Take 1500 mg with meals and 500 mg with snacks    [provider]  traMADol (ULTRAM) 50 MG tablet Take 1 tablet (50 mg total) by mouth daily as needed. 05/22/18   Gayland Curry, DO    Family History Family History  Adopted: Yes  Problem Relation Age of Onset  . Colon cancer Neg Hx    . Esophageal cancer Neg Hx   . Rectal cancer Neg Hx   . Stomach cancer Neg Hx     Social History Social History   Tobacco Use  . Smoking status: Current Every Day Smoker    Packs/day: 0.50    Years: 32.00    Pack years: 16.00    Types: Cigarettes  . Smokeless tobacco: Never Used  . Tobacco comment: 1 cigarette every couple days  Substance Use Topics  . Alcohol use: No    Alcohol/week: 0.0 standard drinks  . Drug use: No     Allergies   Codeine   Review of Systems Review of Systems  Constitutional: Negative for chills, diaphoresis, fatigue and fever.  HENT: Positive for congestion.   Respiratory: Positive for cough (chronic), chest tightness and shortness of breath. Negative for sputum production and wheezing.   Cardiovascular: Positive for leg swelling (chronic). Negative for chest pain and palpitations.  Gastrointestinal: Negative for abdominal pain, constipation, diarrhea, nausea and vomiting.  Musculoskeletal: Negative for back pain, neck pain and neck stiffness.  Neurological: Negative for dizziness, light-headedness, numbness and headaches.  Psychiatric/Behavioral: Negative for agitation.  All other systems reviewed and are negative.    Physical Exam Updated Vital Signs BP (!) 187/88   Pulse 76   Temp 98.1 F (36.7 C) (Oral)   Resp 20   SpO2 99%   Physical Exam Vitals signs and nursing note reviewed.  Constitutional:      General: He is not in acute distress.    Appearance: He is well-developed. He is not ill-appearing, toxic-appearing or diaphoretic.  HENT:     Head: Normocephalic and atraumatic.  Eyes:     Conjunctiva/sclera: Conjunctivae normal.     Pupils: Pupils are equal, round, and reactive to light.  Neck:     Musculoskeletal: Neck supple.  Cardiovascular:     Rate and Rhythm: Normal rate and regular rhythm.     Heart sounds: No murmur.  Pulmonary:     Effort: Pulmonary effort is normal. Tachypnea present. No respiratory distress.      Breath sounds: Rales present. No rhonchi.  Chest:     Chest wall: No tenderness.  Abdominal:     Palpations: Abdomen is soft.     Tenderness: There is no abdominal tenderness.  Musculoskeletal:  Right knee: Tenderness found.     Right lower leg: Tenderness: knee. No edema.     Left lower leg: He exhibits no tenderness. No edema.  Skin:    General: Skin is warm and dry.     Capillary Refill: Capillary refill takes less than 2 seconds.     Findings: No rash.  Neurological:     General: No focal deficit present.     Mental Status: He is alert.      ED Treatments / Results  Labs (all labs ordered are listed, but only abnormal results are displayed) Labs Reviewed  CBC WITH DIFFERENTIAL/PLATELET - Abnormal; Notable for the following components:      Result Value   RBC 3.18 (*)    Hemoglobin 10.0 (*)    HCT 31.9 (*)    MCV 100.3 (*)    RDW 16.2 (*)    Platelets 101 (*)    All other components within normal limits  COMPREHENSIVE METABOLIC PANEL - Abnormal; Notable for the following components:   BUN 34 (*)    Creatinine, Ser 13.79 (*)    Total Bilirubin 1.5 (*)    GFR calc non Af Amer 4 (*)    GFR calc Af Amer 4 (*)    Anion gap 16 (*)    All other components within normal limits  TROPONIN I - Abnormal; Notable for the following components:   Troponin I 0.03 (*)    All other components within normal limits    EKG EKG Interpretation  Date/Time:  Sunday Jun 01 2018 20:24:08 EDT Ventricular Rate:  76 PR Interval:    QRS Duration: 88 QT Interval:  417 QTC Calculation: 469 R Axis:   69 Text Interpretation:  Sinus rhythm Probable left ventricular hypertrophy When compared to prior, no significant changes seen.  No STEMI Confirmed by Antony Blackbird [phone]) on 06/01/2018 8:30:02 PM   Radiology Dg Chest 2 View  Result Date: 06/01/2018 CLINICAL DATA:  Shortness of breath EXAM: CHEST - 2 VIEW COMPARISON:  05/14/2018 FINDINGS: Cardiomegaly. No significant airspace  opacity. Osseous structures are unremarkable. IMPRESSION: Cardiomegaly without acute abnormality of the lungs. Electronically Signed   By: Eddie Candle M.D.   On: 06/01/2018 21:17   Dg Knee Complete 4 Views Right  Result Date: 06/01/2018 CLINICAL DATA:  Right knee pain, injury EXAM: RIGHT KNEE - COMPLETE 4+ VIEW COMPARISON:  None. FINDINGS: No definite fracture or dislocation of the right knee. There is subtle irregularity of the superior aspect of the right tibial tuberosity, seen on lateral view only, concerning for fracture. Minimal tricompartmental joint space narrowing. Soft tissue edema overlying the anterior knee. Probable nonspecific knee joint effusion. Consider CT or MRI to further evaluate for fracture, if suspected. IMPRESSION: No definite fracture or dislocation of the right knee. There is subtle irregularity of the superior aspect of the right tibial tuberosity, seen on lateral view only, concerning for fracture. Minimal tricompartmental joint space narrowing. Soft tissue edema overlying the anterior knee. Probable nonspecific knee joint effusion. Consider CT or MRI to further evaluate for fracture, if suspected. Electronically Signed   By: Eddie Candle M.D.   On: 06/01/2018 21:23    Procedures Procedures (including critical care time)  Medications Ordered in ED Medications  Chlorhexidine Gluconate Cloth 2 % PADS 6 each (has no administration in time range)  calcitRIOL (ROCALTROL) capsule 1 mcg (has no administration in time range)  sucroferric oxyhydroxide (VELPHORO) chewable tablet 1,000 mg (has no administration in time range)  Initial Impression / Assessment and Plan / ED Course  I have reviewed the triage vital signs and the nursing notes.  Pertinent labs & imaging results that were available during my care of the patient were reviewed by me and considered in my medical decision making (see chart for details).  Clinical Course as of Jun 02 7  Mon Jun 02, 2018  0008  Total Bilirubin(!): 1.5 [CT]    Clinical Course User Index [CT] Keyah Blizard, Gwenyth Allegra, MD       Kirk [name] is a 58 y.o. male with a past medical history significant for hypertension, ESRD on dialysis, hyperlipidemia, prior GI bleed, GERD, and asthma who presents with shortness of breath.  He reports that he feels fluid overloaded.  He takes his dialysis Monday, Wednesday, and Friday but he reports that he can wait for dialysis tomorrow.  He says that he is very short of breath and this feels the exact same as when he is fluid overloaded.  He says his legs are slightly swollen and he cannot lay flat.  He cannot even sit back in the exam room bed, he has to sit on the side of the bed.  He is tachypneic however his oxygen saturations are normal on room air initially.  He reports the symptoms are very exertional.  He denies any chest pain.  Denies any fevers or chills but does report a chronic cough.  He denies any constipation or diarrhea and reports he does not make urine.  He denies any history of DVT or PE.  He reports he has right knee pain for the last few weeks after getting hit by a dog.  He is still able to ambulate and he has a home knee sleeve in place.  On exam, lungs have crackles in the bases.  No significant wheezing.  No rhonchi.  Chest and abdomen nontender.  Right knee is tender to palpation.  Mild to moderate edema in the legs.  EKG shows no STEMI.  Clinically I am concerned of fluid overload based on the clinical exam for the patient and his report of similar symptoms in the past.  As he is unable to lay back or even lie flat, I am concerned about him safely going home even though he is maintaining his oxygen saturations at this time.  Low suspicion for pneumonia given his lack of fevers or chills however will get chest x-ray to look for fluid or pneumonia.  Anticipate touching base with nephrology after work-up.     Diagnostic testing results show elevated troponin.  Metabolic  panel shows elevated creatinine from prior at 13.79.  Liver function is normal.  Other electrolytes reassuring.  Due to patient's inability to lay back or lay flat, I am concerned about fluid overload given his history and exam.  Nephrology was called who will come see patient.  Anticipate he will need dialysis tonight.  Unclear if he will need admission or will be safe to go home after dialysis.  Will defer to nephrology team.  Patient's knee x-ray showed no definite fracture or dislocation.  Did show a subtle abnormality which may need outpatient imaging to further evaluate.  He will be given a knee immobilizer.  anticipate following up on nephrology recommendations.   Final Clinical Impressions(s) / ED Diagnoses   Final diagnoses:  Shortness of breath    Clinical Impression: 1. Shortness of breath     Disposition: Awaiting nephrology recommendations, likely dialysis and possible admission.  This note  was prepared with assistance of Systems analyst. Occasional wrong-word or sound-a-like substitutions may have occurred due to the inherent limitations of voice recognition software.     Honora Searson, Gwenyth Allegra, MD 06/02/18 0010

## 2018-06-01 NOTE — ED Notes (Signed)
Patient back from  X-ray 

## 2018-06-01 NOTE — ED Notes (Signed)
Pt request to be put on O2 stating that he has a harder time breathing at night. Pts O2 saturation was at 98%. This tech placed patient on 2L O2 [place] for comfort.

## 2018-06-01 NOTE — ED Triage Notes (Signed)
Pt arrives POV for eval of shortness of breath onset this AM. Reports this happens when he has too much fluid on him, pt is M/W/F dialysis, has not missed any days, but states he does not feel well enough to wait until tomorrow. Pt is markedly short of breath during triage, speaking in 2-3 word sentences. LS w/ bibasilar crackles

## 2018-06-01 NOTE — ED Provider Notes (Signed)
11:40 PM  Assumed care from Dr. Sherry Ruffing.  Patient is a 58 yo M with h/o ESRD on HD M, W, F.  Patient is SOB and volume overloaded.  Nephrology, Dr. Posey Pronto, coming in to see patient.  No hypoxia but has increased work of breathing.  Normal potassium.  Patient is hypertensive.  Patient may be able to get dialysis tonight and then go home without admission.  Awaiting nephro recommendations.  Had right knee injury 4 weeks ago.  Xray shows possible fracture of right tibial tuberosity.  Patient has been ambulatory.  Patient in knee immobilizer.  Can follow up as an outpatient.  12:20 AM  Nephro has seen patient and will try to get him to dialysis tonight and if not tonight then I will be in the morning.  They are concerned that his shortness of breath is not all secondary to volume overload and feel he may need further work-up if this does not improve after dialysis.  Was not wheezing on exam per previous physician.  Will discuss with medicine for admission.   12:58 AM Discussed patient's case with hospitalist, Dr. Marlowe Sax.  I have recommended admission and patient (and family if present) agree with this plan. Admitting physician will place admission orders.   I reviewed all nursing notes, vitals, pertinent previous records, EKGs, lab and urine results, imaging (as available).     Ward, Delice Bison, DO 06/02/18 [phone]

## 2018-06-01 NOTE — ED Notes (Signed)
The pts wife brought him food

## 2018-06-01 NOTE — ED Notes (Signed)
Patient transported to X-ray 

## 2018-06-01 NOTE — Consult Note (Signed)
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  Attended his complete dialysis session 2 days ago on Friday attaining his dry weight and in spite of conservative fluid intake, today noticed progressively worsening shortness of breath with a sensation of increased abdominal girth.  He denies any chills, fever or sputum production.  He has a chronic cough from smoking and wears CPAP at night for OSA.  He denies any nausea, vomiting or diarrhea.  Per his wife, he has recently been on 2 rounds of antibiotics for cough and shortness of breath without relief.  Recently suffered an accidental fall onto his right knee which is currently in an immobilizer status post x-ray studies showing an irregularity of the tibial tuberosity.  Chest x-ray showed cardiomegaly without any acute abnormality of the lung space.  Dialysis prescription: [facility], Monday/Wednesday/Friday, 4 hours 15 minutes, BFR 450, DFR 900, 2K/2.0 calcium, EDW 99.5 kg, heparin 4000 units, calcitriol 1 mcg 3 times weekly, Venofer 50 mg IV q. weekly, Mircera 200 mcg every 2 weeks  Past Medical History:  Diagnosis Date  . Acute edema of lung, unspecified   . Acute, but ill-defined, cerebrovascular disease   . Allergy   . Anemia   . 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)   . Anxiety   . Asthma   . Carpal tunnel syndrome   . Cellulitis and abscess of trunk   . Cholelithiasis 07/13/2014  . Chronic headaches   . Debility, unspecified   . Dermatophytosis of the body   . Dysrhythmia    history of  . Edema   . End stage renal disease on dialysis [facility])    "MWF; [facility] in [facility]" (10/21/2014)  .  Essential hypertension, benign   . GERD (gastroesophageal reflux disease)   . Gout, unspecified   . HTN (hypertension)   . Hypertrophy of prostate without urinary obstruction and other lower urinary tract symptoms (LUTS)   . Hypotension, unspecified   . Impotence of organic origin   . Insomnia, unspecified   . Kidney replaced by transplant   . Localization-related (focal) (partial) epilepsy and epileptic syndromes with complex partial seizures, without mention of intractable epilepsy   . Lumbago   . Memory loss   . OSA on CPAP   . Other and unspecified hyperlipidemia   . Other chronic nonalcoholic liver disease   . Other malaise and fatigue   . Other nonspecific abnormal serum enzyme levels   . Pain in joint, lower leg   . Pain in joint, upper arm   . Pneumonia "several times"  . Renal dialysis status(V45.11) 02/05/2010   restarted 01/02/13 ofter renal trransplant failure  . Secondary hyperparathyroidism (of renal origin)   . Shortness of breath   . Sleep apnea   . Tension headache   . Unspecified constipation   . Unspecified essential hypertension   . Unspecified hereditary and idiopathic peripheral neuropathy   . Unspecified vitamin D deficiency     Past Surgical History:  Procedure Laterality Date  . AV FISTULA PLACEMENT Left ?2010   "forearm; at [facility]"  . BACK SURGERY    . CARDIAC CATHETERIZATION  03/21/2011  . CHOLECYSTECTOMY N/A 10/21/2014   Procedure: LAPAROSCOPIC CHOLECYSTECTOMY WITH INTRAOPERATIVE CHOLANGIOGRAM;  Surgeon: Autumn Messing  III, MD;  Location: [place];  Service: General;  Laterality: N/A;  . COLONOSCOPY    . 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 KIDNEY TRANSPLANT  08/17/2011   [place]  10/21/2014   w/IOC  . LEFT HEART CATHETERIZATION WITH CORONARY ANGIOGRAM N/A 03/21/2011   Procedure: LEFT HEART CATHETERIZATION WITH CORONARY ANGIOGRAM;  Surgeon: [name], MD;  Location: [facility] CATH  LAB;  Service: Cardiovascular;  Laterality: N/A;  . NEPHRECTOMY  08/2013   removed transplaned kidney  . POSTERIOR FUSION CERVICAL SPINE  06/25/2012   for spinal stenosis  . VASECTOMY  2010    Family History  Adopted: Yes  Problem Relation Age of Onset  . Colon cancer Neg Hx   . Esophageal cancer Neg Hx   . Rectal cancer Neg Hx   . Stomach cancer Neg Hx     Social History:  reports that he has been smoking cigarettes. He has a 16.00 pack-year smoking history. He has never used smokeless tobacco. He reports that he does not drink alcohol or use drugs.  Allergies:  Allergies  Allergen Reactions  . Codeine Nausea And Vomiting    Medications:  Scheduled: . [START ON 06/02/2018] calcitRIOL  1 mcg Oral Q M,W,F-HD  . [START ON 06/02/2018] Chlorhexidine Gluconate Cloth  6 each Topical Q0600    BMP Latest Ref Rng & Units 06/01/2018 05/14/2018 02/03/2018  Glucose 70 - 99 mg/dL 98 152(H) 164(H)  BUN 6 - 20 mg/dL 34(H) 14 43(H)  Creatinine 0.61 - 1.24 mg/dL 13.79(H) 6.28(H) 8.37(H)  BUN/Creat Ratio 6 - 22 (calc) - - 5(L)  Sodium 135 - 145 mmol/L 142 140 140  Potassium 3.5 - 5.1 mmol/L 4.1 3.3(L) 4.0  Chloride 98 - 111 mmol/L 102 99 99  CO2 22 - 32 mmol/L 24 28 29   Calcium 8.9 - 10.3 mg/dL 9.1 8.5(L) 8.4(L)   CBC Latest Ref Rng & Units 06/01/2018 05/14/2018 02/03/2018  WBC 4.0 - 10.5 K/uL 5.1 4.6 5.8  Hemoglobin 13.0 - 17.0 g/dL 10.0(L) 9.6(L) 8.5(L)  Hematocrit 39.0 - 52.0 % 31.9(L) 31.0(L) 25.3(L)  Platelets 150 - 400 K/uL 101(L) 136(L) 131(L)     Dg Chest 2 View  Result Date: 06/01/2018 CLINICAL DATA:  Shortness of breath EXAM: CHEST - 2 VIEW COMPARISON:  05/14/2018 FINDINGS: Cardiomegaly. No significant airspace opacity. Osseous structures are unremarkable. IMPRESSION: Cardiomegaly without acute abnormality of the lungs. Electronically Signed   By: Eddie Candle M.D.   On: 06/01/2018 21:17   Dg Knee Complete 4 Views Right  Result Date: 06/01/2018 CLINICAL DATA:  Right knee pain,  injury EXAM: RIGHT KNEE - COMPLETE 4+ VIEW COMPARISON:  None. FINDINGS: No definite fracture or dislocation of the right knee. There is subtle irregularity of the superior aspect of the right tibial tuberosity, seen on lateral view only, concerning for fracture. Minimal tricompartmental joint space narrowing. Soft tissue edema overlying the anterior knee. Probable nonspecific knee joint effusion. Consider CT or MRI to further evaluate for fracture, if suspected. IMPRESSION: No definite fracture or dislocation of the right knee. There is subtle irregularity of the superior aspect of the right tibial tuberosity, seen on lateral view only, concerning for fracture. Minimal tricompartmental joint space narrowing. Soft tissue edema overlying the anterior knee. Probable nonspecific knee joint effusion. Consider CT or MRI to further evaluate for fracture, if suspected. Electronically Signed   By: Eddie Candle M.D.   On: 06/01/2018 21:23    Review  of Systems  Constitutional: Positive for malaise/fatigue. Negative for chills and weight loss.  HENT: Negative.   Eyes: Negative.   Respiratory: Positive for cough, shortness of breath and wheezing. Negative for sputum production.   Cardiovascular: Positive for orthopnea. Negative for chest pain, palpitations and leg swelling.  Gastrointestinal: Negative.   Genitourinary: Negative.   Musculoskeletal: Positive for joint pain.       Right knee  Skin: Negative.   Neurological: Negative.    Blood pressure (!) 181/72, pulse 72, temperature 98.1 F (36.7 C), temperature source Oral, resp. rate 15, SpO2 98 %. Physical Exam  Nursing note and vitals reviewed. Constitutional: He is oriented to person, place, and time. He appears well-developed and well-nourished. No distress.  HENT:  Head: Normocephalic and atraumatic.  Mouth/Throat: Oropharynx is clear and moist. No oropharyngeal exudate.  Eyes: Pupils are equal, round, and reactive to light. EOM are normal. No  scleral icterus.  Neck: Normal range of motion. Neck supple. No JVD present.  Cardiovascular: Normal rate, regular rhythm and normal heart sounds.  No murmur heard. Respiratory: Effort normal. He has wheezes. He has rales.  Coarse rales with expiratory rhonchi bilaterally  GI: Soft. Bowel sounds are normal. He exhibits distension. There is no abdominal tenderness. There is no guarding.  Musculoskeletal: Normal range of motion.        General: No edema.     Comments: Left radiocephalic fistula  Neurological: He is alert and oriented to person, place, and time.  Skin: Skin is warm and dry. No rash noted.  Psychiatric: His behavior is normal.    Assessment/Plan: 1.  Shortness of breath and patient with end-stage renal disease: It is not clear that this is entirely from volume excess based on chest x-ray/physical exam findings and modest weight gain over the weekend.  I will order for hemodialysis if possible for tonight or first thing tomorrow morning depending on scheduling.  He may need additional evaluation/management for bronchospasm with bronchodilators and possibly corticosteroid course given recent refractoriness to antibiotic therapy. 2.  End-stage renal disease: Hemodialysis will be ordered urgently either for tonight or first round tomorrow morning for attempt at lowering his dry weight to impact his shortness of breath. 3.  Hypertension: Monitor with ultrafiltration/hemodialysis. 4.  Anemia of chronic kidney disease: Without overt loss, monitor trend of H/H with serial CBCs. 5.  Secondary hyperparathyroidism: Resume calcitriol for PTH suppression and Velphoro for phosphorus control.  Hyperphosphatemic as an outpatient. 6.  Right knee injury/pain: Currently in soft knee immobilizer-anticipate input from orthopedic surgery.  Analee Montee K. 06/01/2018, 11:07 PM

## 2018-06-01 NOTE — Progress Notes (Signed)
Orthopedic Tech Progress Note Patient Details:  Kirk [name] 10-27-1960 [id number]  Ortho Devices Type of Ortho Device: Knee Immobilizer Ortho Device/Splint Interventions: Adjustment, Application, Ordered   Post Interventions Patient Tolerated: Well Instructions Provided: Poper ambulation with device, Care of device, Adjustment of device   Melony Overly T 06/01/2018, 9:16 PM

## 2018-06-02 ENCOUNTER — Observation Stay (HOSPITAL_COMMUNITY): Payer: Medicare Other

## 2018-06-02 ENCOUNTER — Encounter (HOSPITAL_COMMUNITY): Payer: Self-pay | Admitting: Internal Medicine

## 2018-06-02 DIAGNOSIS — M25561 Pain in right knee: Secondary | ICD-10-CM | POA: Diagnosis present

## 2018-06-02 DIAGNOSIS — G4733 Obstructive sleep apnea (adult) (pediatric): Secondary | ICD-10-CM | POA: Diagnosis not present

## 2018-06-02 DIAGNOSIS — F419 Anxiety disorder, unspecified: Secondary | ICD-10-CM | POA: Diagnosis not present

## 2018-06-02 DIAGNOSIS — D631 Anemia in chronic kidney disease: Secondary | ICD-10-CM | POA: Diagnosis not present

## 2018-06-02 DIAGNOSIS — M25461 Effusion, right knee: Secondary | ICD-10-CM | POA: Diagnosis not present

## 2018-06-02 DIAGNOSIS — R0602 Shortness of breath: Secondary | ICD-10-CM | POA: Diagnosis not present

## 2018-06-02 DIAGNOSIS — S82201A Unspecified fracture of shaft of right tibia, initial encounter for closed fracture: Secondary | ICD-10-CM | POA: Diagnosis present

## 2018-06-02 DIAGNOSIS — S8991XA Unspecified injury of right lower leg, initial encounter: Secondary | ICD-10-CM | POA: Diagnosis not present

## 2018-06-02 DIAGNOSIS — I120 Hypertensive chronic kidney disease with stage 5 chronic kidney disease or end stage renal disease: Secondary | ICD-10-CM | POA: Diagnosis not present

## 2018-06-02 DIAGNOSIS — R7989 Other specified abnormal findings of blood chemistry: Secondary | ICD-10-CM | POA: Diagnosis not present

## 2018-06-02 DIAGNOSIS — Z1159 Encounter for screening for other viral diseases: Secondary | ICD-10-CM | POA: Diagnosis not present

## 2018-06-02 DIAGNOSIS — Z992 Dependence on renal dialysis: Secondary | ICD-10-CM | POA: Diagnosis not present

## 2018-06-02 DIAGNOSIS — J45909 Unspecified asthma, uncomplicated: Secondary | ICD-10-CM | POA: Insufficient documentation

## 2018-06-02 DIAGNOSIS — J45901 Unspecified asthma with (acute) exacerbation: Secondary | ICD-10-CM | POA: Diagnosis not present

## 2018-06-02 DIAGNOSIS — N2581 Secondary hyperparathyroidism of renal origin: Secondary | ICD-10-CM | POA: Diagnosis not present

## 2018-06-02 DIAGNOSIS — N186 End stage renal disease: Secondary | ICD-10-CM | POA: Diagnosis not present

## 2018-06-02 LAB — CBC
HCT: 31.3 % — ABNORMAL LOW (ref 39.0–52.0)
HCT: 33 % — ABNORMAL LOW (ref 39.0–52.0)
Hemoglobin: 9.5 g/dL — ABNORMAL LOW (ref 13.0–17.0)
Hemoglobin: 10.4 g/dL — ABNORMAL LOW (ref 13.0–17.0)
MCH: 30.4 pg (ref 26.0–34.0)
MCH: 31 pg (ref 26.0–34.0)
MCHC: 30.4 g/dL (ref 30.0–36.0)
MCHC: 31.5 g/dL (ref 30.0–36.0)
MCV: 100 fL (ref 80.0–100.0)
MCV: 98.2 fL (ref 80.0–100.0)
Platelets: 100 10*3/uL — ABNORMAL LOW (ref 150–400)
Platelets: 139 10*3/uL — ABNORMAL LOW (ref 150–400)
RBC: 3.13 MIL/uL — ABNORMAL LOW (ref 4.22–5.81)
RBC: 3.36 MIL/uL — ABNORMAL LOW (ref 4.22–5.81)
RDW: 16.4 % — ABNORMAL HIGH (ref 11.5–15.5)
RDW: 16.1 % — ABNORMAL HIGH (ref 11.5–15.5)
WBC: 6.9 10*3/uL (ref 4.0–10.5)
WBC: 8.8 10*3/uL (ref 4.0–10.5)
nRBC: 0 % (ref 0.0–0.2)
nRBC: 0 % (ref 0.0–0.2)

## 2018-06-02 LAB — TROPONIN I
Troponin I: 0.03 ng/mL (ref <0.03)
Troponin I: <0.03 ng/mL (ref <0.03)

## 2018-06-02 LAB — RENAL FUNCTION PANEL
Albumin: 3.5 g/dL (ref 3.5–5.0)
Anion gap: 14 (ref 5–15)
BUN: 40 mg/dL — ABNORMAL HIGH (ref 6–20)
CO2: 24 mmol/L (ref 22–32)
Calcium: 9 mg/dL (ref 8.9–10.3)
Chloride: 105 mmol/L (ref 98–111)
Creatinine, Ser: 14.16 mg/dL — ABNORMAL HIGH (ref 0.61–1.24)
GFR calc Af Amer: 4 mL/min — ABNORMAL LOW (ref >60)
GFR calc non Af Amer: 3 mL/min — ABNORMAL LOW (ref >60)
Glucose, Bld: 134 mg/dL — ABNORMAL HIGH (ref 70–99)
Phosphorus: 8.4 mg/dL — ABNORMAL HIGH (ref 2.5–4.6)
Potassium: 4.2 mmol/L (ref 3.5–5.1)
Sodium: 143 mmol/L (ref 135–145)

## 2018-06-02 LAB — BILIRUBIN, FRACTIONATED(TOT/DIR/INDIR)
Bilirubin, Direct: 0.1 mg/dL (ref 0.0–0.2)
Indirect Bilirubin: 1 mg/dL — ABNORMAL HIGH (ref 0.3–0.9)
Total Bilirubin: 1.1 mg/dL (ref 0.3–1.2)

## 2018-06-02 LAB — SARS CORONAVIRUS 2 BY RT PCR (HOSPITAL ORDER, PERFORMED IN ~~LOC~~ HOSPITAL LAB): SARS Coronavirus 2: NEGATIVE

## 2018-06-02 LAB — HIV ANTIBODY (ROUTINE TESTING W REFLEX): HIV Screen 4th Generation wRfx: NONREACTIVE

## 2018-06-02 MED ORDER — PANTOPRAZOLE SODIUM 40 MG PO TBEC
40.0000 mg | DELAYED_RELEASE_TABLET | Freq: Every day | ORAL | Status: DC
  Administered 2018-06-02 – 2018-06-03 (×2): 40 mg via ORAL
  Filled 2018-06-02 (×2): qty 1

## 2018-06-02 MED ORDER — LIDOCAINE HCL (PF) 1 % IJ SOLN: 5.0000 mL | INTRAMUSCULAR | Status: DC | PRN

## 2018-06-02 MED ORDER — METOPROLOL TARTRATE 50 MG PO TABS
75.0000 mg | ORAL_TABLET | Freq: Two times a day (BID) | ORAL | Status: DC
  Administered 2018-06-02 – 2018-06-03 (×4): 75 mg via ORAL
  Filled 2018-06-02: qty 1
  Filled 2018-06-02: qty 3
  Filled 2018-06-02 (×2): qty 1

## 2018-06-02 MED ORDER — ASPIRIN EC 81 MG PO TBEC
81.0000 mg | DELAYED_RELEASE_TABLET | Freq: Every day | ORAL | Status: DC
  Administered 2018-06-02 – 2018-06-03 (×2): 81 mg via ORAL
  Filled 2018-06-02 (×2): qty 1

## 2018-06-02 MED ORDER — TRAMADOL HCL 50 MG PO TABS
50.0000 mg | ORAL_TABLET | Freq: Two times a day (BID) | ORAL | Status: DC | PRN
  Administered 2018-06-02: 50 mg via ORAL

## 2018-06-02 MED ORDER — DIPHENHYDRAMINE HCL 25 MG PO CAPS: 25.0000 mg | ORAL_CAPSULE | Freq: Two times a day (BID) | ORAL | Status: DC | PRN

## 2018-06-02 MED ORDER — ACETAMINOPHEN 500 MG PO TABS: 500.0000 mg | ORAL_TABLET | Freq: Four times a day (QID) | ORAL | Status: DC | PRN

## 2018-06-02 MED ORDER — HEPARIN SODIUM (PORCINE) 1000 UNIT/ML DIALYSIS
4000.0000 [IU] | INTRAMUSCULAR | Status: DC | PRN
  Filled 2018-06-02: qty 4

## 2018-06-02 MED ORDER — PENTAFLUOROPROP-TETRAFLUOROETH EX AERO: 1.0000 "application " | INHALATION_SPRAY | CUTANEOUS | Status: DC | PRN

## 2018-06-02 MED ORDER — LIDOCAINE-PRILOCAINE 2.5-2.5 % EX CREA
1.0000 "application " | TOPICAL_CREAM | CUTANEOUS | Status: DC | PRN
  Filled 2018-06-02: qty 5

## 2018-06-02 MED ORDER — ALBUTEROL SULFATE (2.5 MG/3ML) 0.083% IN NEBU: 2.5000 mg | INHALATION_SOLUTION | Freq: Four times a day (QID) | RESPIRATORY_TRACT | Status: DC | PRN

## 2018-06-02 MED ORDER — SODIUM CHLORIDE 0.9 % IV SOLN: 100.0000 mL | INTRAVENOUS | Status: DC | PRN

## 2018-06-02 MED ORDER — ACETAMINOPHEN 500 MG PO TABS
500.0000 mg | ORAL_TABLET | Freq: Four times a day (QID) | ORAL | Status: DC | PRN
  Administered 2018-06-02: 500 mg via ORAL
  Filled 2018-06-02: qty 1

## 2018-06-02 MED ORDER — HYDRALAZINE HCL 20 MG/ML IJ SOLN
10.0000 mg | Freq: Three times a day (TID) | INTRAMUSCULAR | Status: DC | PRN
  Administered 2018-06-02: 10 mg via INTRAVENOUS
  Filled 2018-06-02: qty 1

## 2018-06-02 MED ORDER — ALBUTEROL SULFATE (2.5 MG/3ML) 0.083% IN NEBU: 2.5000 mg | INHALATION_SOLUTION | RESPIRATORY_TRACT | Status: DC | PRN

## 2018-06-02 MED ORDER — HYDRALAZINE HCL 100 MG PO TABS: 100.0000 mg | ORAL_TABLET | Freq: Two times a day (BID) | ORAL | Status: DC

## 2018-06-02 MED ORDER — HYDRALAZINE HCL 20 MG/ML IJ SOLN: 10.0000 mg | Freq: Three times a day (TID) | INTRAMUSCULAR | Status: DC | PRN

## 2018-06-02 MED ORDER — CLONAZEPAM 0.5 MG PO TABS: 0.5000 mg | ORAL_TABLET | Freq: Two times a day (BID) | ORAL | Status: DC | PRN

## 2018-06-02 MED ORDER — GUAIFENESIN ER 600 MG PO TB12
1200.0000 mg | ORAL_TABLET | Freq: Two times a day (BID) | ORAL | Status: DC
  Administered 2018-06-02 – 2018-06-03 (×3): 1200 mg via ORAL
  Filled 2018-06-02 (×3): qty 2

## 2018-06-02 MED ORDER — HEPARIN SODIUM (PORCINE) 1000 UNIT/ML DIALYSIS
1000.0000 [IU] | INTRAMUSCULAR | Status: DC | PRN
  Filled 2018-06-02: qty 1

## 2018-06-02 MED ORDER — PRAVASTATIN SODIUM 40 MG PO TABS
40.0000 mg | ORAL_TABLET | Freq: Every day | ORAL | Status: DC
  Administered 2018-06-02: 40 mg via ORAL
  Filled 2018-06-02: qty 1

## 2018-06-02 MED ORDER — GUAIFENESIN-DM 100-10 MG/5ML PO SYRP: 10.0000 mL | ORAL_SOLUTION | ORAL | Status: DC | PRN

## 2018-06-02 MED ORDER — TRAMADOL HCL 50 MG PO TABS
ORAL_TABLET | ORAL | Status: AC
  Filled 2018-06-02: qty 1

## 2018-06-02 MED ORDER — METHYLPREDNISOLONE SODIUM SUCC 125 MG IJ SOLR
60.0000 mg | Freq: Two times a day (BID) | INTRAMUSCULAR | Status: DC
  Administered 2018-06-02 – 2018-06-03 (×3): 60 mg via INTRAVENOUS
  Filled 2018-06-02 (×3): qty 2

## 2018-06-02 MED ORDER — NICOTINE 14 MG/24HR TD PT24
14.0000 mg | MEDICATED_PATCH | Freq: Every day | TRANSDERMAL | Status: DC
  Administered 2018-06-02 – 2018-06-03 (×2): 14 mg via TRANSDERMAL
  Filled 2018-06-02 (×2): qty 1

## 2018-06-02 MED ORDER — MOMETASONE FURO-FORMOTEROL FUM 200-5 MCG/ACT IN AERO
2.0000 | INHALATION_SPRAY | Freq: Two times a day (BID) | RESPIRATORY_TRACT | Status: DC
  Administered 2018-06-02 – 2018-06-03 (×3): 2 via RESPIRATORY_TRACT
  Filled 2018-06-02: qty 8.8

## 2018-06-02 MED ORDER — HYDRALAZINE HCL 20 MG/ML IJ SOLN: 10.0000 mg | INTRAMUSCULAR | Status: DC | PRN

## 2018-06-02 MED ORDER — HEPARIN SODIUM (PORCINE) 5000 UNIT/ML IJ SOLN
5000.0000 [IU] | Freq: Three times a day (TID) | INTRAMUSCULAR | Status: DC
  Administered 2018-06-02 (×2): 5000 [IU] via SUBCUTANEOUS
  Filled 2018-06-02 (×4): qty 1

## 2018-06-02 MED ORDER — ALBUTEROL SULFATE (2.5 MG/3ML) 0.083% IN NEBU
2.5000 mg | INHALATION_SOLUTION | Freq: Four times a day (QID) | RESPIRATORY_TRACT | Status: AC
  Administered 2018-06-02 (×2): 2.5 mg via RESPIRATORY_TRACT
  Filled 2018-06-02 (×4): qty 3

## 2018-06-02 NOTE — ED Notes (Signed)
Pt waiting on a bed assignment  He reports that he needs c-pap to sleep

## 2018-06-02 NOTE — Progress Notes (Signed)
[facility] Progress Note  Name: KORTNEY SCHOENFELDER MRN: [id number] DOB: April 30, 1960  Chief Complaint:  Shortness of breath   Subjective:  Seen earlier on HD.  162/82 and HR 75.  Patient states that his breathing is now better after HD.  Back has been hurting since he started HD.  Denies overt chest pain, n/v, jaw claudication.  He reports to me a dry weight of 99.5 kg.  He states that his discomfort improved coming off of HD.  COVID negative.   Review of systems:  Shortness of breathing - improving  Denies n/v   No intake or output data in the 24 hours ending 06/02/18 0929  Vitals:  Vitals:   06/02/18 0800 06/02/18 0830 06/02/18 0900 06/02/18 0915  BP: (!) 171/82 (!) 155/79 (!) 162/82 (!) 143/69  Pulse: 73 71 75 77  Resp: 15 16 15 16   Temp:    98.4 F (36.9 C)  TempSrc:    Oral  SpO2:      Weight:         Physical Exam:  General adult male in bed on HD  HEENT normocephalic atraumatic extraocular movements intact sclera anicteric Neck supple trachea midline Lungs unlabored and clear to auscultation; on supplemental oxygen per nasal cannula Heart RRR; no rub Abdomen soft nontender nondistended Extremities no pitting edema  Psych normal mood and affect Access: LUE AVF in use    Medications reviewed   Labs:  BMP Latest Ref Rng & Units 06/02/2018 06/01/2018 05/14/2018  Glucose 70 - 99 mg/dL 134(H) 98 152(H)  BUN 6 - 20 mg/dL 40(H) 34(H) 14  Creatinine 0.61 - 1.24 mg/dL 14.16(H) 13.79(H) 6.28(H)  BUN/Creat Ratio 6 - 22 (calc) - - -  Sodium 135 - 145 mmol/L 143 142 140  Potassium 3.5 - 5.1 mmol/L 4.2 4.1 3.3(L)  Chloride 98 - 111 mmol/L 105 102 99  CO2 22 - 32 mmol/L 24 24 28   Calcium 8.9 - 10.3 mg/dL 9.0 9.1 8.5(L)     Assessment/Plan:   1.  Shortness of breath and patient with end-stage renal disease: Does not appear solely volume-mediated.  He may need additional evaluation/management for bronchospasm with bronchodilators and possibly corticosteroid  course given refractory to recent antibiotic therapy.  He is below his reported dry weight. 2.  End-stage renal disease: s/p HD today.  Per MWF schedule at [facility] normally  3.  Hypertension: Monitor with ultrafiltration/hemodialysis  4.  Anemia of chronic kidney disease: Without overt loss, monitor trend of H/H with serial CBCs. 5.  Secondary hyperparathyroidism: calcitriol for PTH suppression and Velphoro for phosphorus control.  Hyperphosphatemic as an outpatient. 6.  Right knee injury/pain: knee immobilizer; per primary team    Claudia Desanctis, MD 06/02/2018 9:29 AM

## 2018-06-02 NOTE — Progress Notes (Addendum)
HD tx terminated with 1hr60mins remaining as ordered by nephrologist d/t persistent c/o left back pain. Pt stated" it comes and go", denies any chest pain, No nausea and vomiting noted. Pt in no apparent distress VSS. PRN tramadal given . EKG done. No new orders. Report given to primary Nurse.

## 2018-06-02 NOTE — Progress Notes (Addendum)
Patient admitted to 2 W. 13 with the diagnosis of SOB. Denied any acute pain. Noted dyspnea at rest.. Refused skin assessemt due to dyspnea at rest. He requested to be done later. Patient left to dialysis and as a result,  I couldn't finish his admission t profile, will be completed when he gets  back to the floor.

## 2018-06-02 NOTE — ED Notes (Signed)
Unsuccessful attempt to call report  Will call me back

## 2018-06-02 NOTE — Progress Notes (Signed)
Renal Navigator notified OP HD clinic of patient's admission and negative COVID 19 result to provide continuity of care and safety.  Kirk [name] Renal Navigator [phone] 

## 2018-06-02 NOTE — ED Notes (Signed)
Admitting doctor at the bedside 

## 2018-06-02 NOTE — ED Notes (Signed)
The pt had swab sent  Pt requested coffee given

## 2018-06-02 NOTE — Progress Notes (Signed)
Patient admitted after midnight with 1 day hx sob/LE edema and orthopnea. Also tobacco user with hx moderate persistent asthma. Evaluated by nephrology who opine his sob not entirely related to volume overloaded. Had dialysis today but it was cut short.   A/P  Shortness of breath.  Placed on 2 L supplemental oxygen for comfort. Afebrile and no leukocytosis. Chest x-ray showing cardiomegaly without acute abnormality of the lungs. PE less likely as patient is not tachycardic or hypoxic. Wheezing and rhonchi on exam today.  Of noteNephrology did not feel his shortness of breath could be entirely contributed to volume overload.  Hemodialysis cut short today. COVID-19 rapid test negative -will add scheduled nebs -will add steroids -monotor  Borderline troponin elevation. Likely due to end-stage renal disease.  Troponin 0.03 x3. EKG without acute ischemic changes.  Patient is not complaining of chest pain. -Cardiac monitoring  Right knee pain, suspected right tibial tuberosity fracture Patient is complaining of a one-week history of right knee pain after his dog ran into his knee. X-ray showing subtle irregularity of the superior aspect of the right tibial tuberosity concerning for fracture and probable nonspecific knee joint effusion.  spoke to ortho who recommended CT wo contrast of knee -CT knee. Will contact ortho pending results otherwise OP follow up -Pain management: Tramadol PRN, Tylenol PRN  ESRD on HD MWF Potassium 4.1, bicarb 24.  BUN 34.  Creatinine 14.1.Dialyzed today but cut short due to back pain -Appreciate nephrology recommendations  Hypertension. Only fair control.  -Continue home metoprolol -Hydralazine PRN  Anemia of chronic disease -Hemoglobin 10.0, at baseline.  Secondary hyperparathyroidism in the setting of ESRD -Continue home calcitriol and velphoro  Elevated T bili  T bili 1.5, remainder of LFTs normal.  Abdominal exam benign. 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  Platelet count 101.  No signs of active bleeding. -Continue to monitor      Tobacco use -NicoDerm patch -Counseled to quit  OSA -CPAP at night  Asthma. Chart review reveal hx of moderate persistent. Home meds include inhaler. Quite wheezy this am -steroids -scheduled nebs -Dulera twice daily  Anxiety -Continue home Klonopin 0.5 mg twice daily as needed   Dyanne Carrel, NP

## 2018-06-02 NOTE — H&P (Addendum)
History and Physical    Kirk [name] ALP:[id number] DOB: 02-02-1960 DOA: 06/01/2018  PCP: Gayland Curry, DO Patient coming from: Home  Chief Complaint: Shortness of breath  HPI: Kirk [name] is a 58 y.o. male with medical history significant of ESRD on HD MWF, anemia of chronic disease, anxiety, asthma, hypertension, GERD, gout, OSA on CPAP, and conditions listed below presenting to the hospital for evaluation of shortness of breath.  Patient states he started feeling short of breath yesterday morning.  Shortness of breath is worse when he tries to walk or do something.  It is also worse when he tries to lay down flat.  States his legs are swollen but he is not sure for how long.  Reports having a chronic cough as he is a smoker.  Denies any fevers or chest pain.  States there was a person at his dialysis center who tested positive for COVID-19 a month ago.  He is also complaining of pain in his right knee which started after his dog ran into his knee a week ago.  It is difficult for him to walk due to the pain.  ED Course: Not hypoxic but had increased work of breathing.  Placed on 2 L supplemental oxygen for comfort.  Afebrile, not tachycardic.  Blood pressure 188/82 on arrival.  No leukocytosis.  Hemoglobin 10.0, at baseline.  Platelet count 101.  BUN 34.  Creatinine 13.7, was 6.2 two weeks ago.  T bili 1.5, remainder of LFTs normal.  Troponin 0.03.  EKG without acute ischemic changes. Chest x-ray showing cardiomegaly without acute abnormality of the lungs.  X-ray of right knee showing subtle irregularity of the superior aspect of the right tibial tuberosity concerning for fracture and probable nonspecific knee joint effusion.  Patient was seen by nephrology who did not feel his shortness of breath could be entirely contributed to volume overload.  Hemodialysis has been ordered, if possible tonight or for first thing in the morning depending on scheduling.  COVID-19 rapid test pending.   Review of Systems: As per HPI otherwise 10 point review of systems negative.  Past Medical History:  Diagnosis Date  . Acute edema of lung, unspecified   . Acute, but ill-defined, cerebrovascular disease   . Allergy   . Anemia   . 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)   . Anxiety   . Asthma   . Carpal tunnel syndrome   . Cellulitis and abscess of trunk   . Cholelithiasis 07/13/2014  . Chronic headaches   . Debility, unspecified   . Dermatophytosis of the body   . Dysrhythmia    history of  . Edema   . End stage renal disease on dialysis [facility])    "MWF; [facility]" (10/21/2014)  . Essential hypertension, benign   . GERD (gastroesophageal reflux disease)   . Gout, unspecified   . HTN (hypertension)   . Hypertrophy of prostate without urinary obstruction and other lower urinary tract symptoms (LUTS)   . Hypotension, unspecified   . Impotence of organic origin   . Insomnia, unspecified   . Kidney replaced by transplant   . Localization-related (focal) (partial) epilepsy and epileptic syndromes with complex partial seizures, without mention of intractable epilepsy   . Lumbago   . Memory loss   . OSA on CPAP   . Other and unspecified hyperlipidemia   . Other chronic nonalcoholic liver disease   . Other malaise and fatigue   . Other nonspecific abnormal serum enzyme  levels   . Pain in joint, lower leg   . Pain in joint, upper arm   . Pneumonia "several times"  . Renal dialysis status(V45.11) 02/05/2010   restarted 01/02/13 ofter renal trransplant failure  . Secondary hyperparathyroidism (of renal origin)   . Shortness of breath   . Sleep apnea   . Tension headache   . Unspecified constipation   . Unspecified essential hypertension   . Unspecified hereditary and idiopathic peripheral neuropathy   . Unspecified vitamin D deficiency     Past Surgical History:  Procedure Laterality Date  . AV FISTULA PLACEMENT Left ?2010   "forearm; at [place]  Specialist"  . BACK SURGERY    . CARDIAC CATHETERIZATION  03/21/2011  . CHOLECYSTECTOMY N/A 10/21/2014   Procedure: LAPAROSCOPIC CHOLECYSTECTOMY WITH INTRAOPERATIVE CHOLANGIOGRAM;  Surgeon: Autumn Messing III, MD;  Location: [facility];  Service: General;  Laterality: N/A;  . COLONOSCOPY    . 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 KIDNEY TRANSPLANT  08/17/2011   [place]  10/21/2014   w/IOC  . LEFT HEART CATHETERIZATION WITH CORONARY ANGIOGRAM N/A 03/21/2011   Procedure: LEFT HEART CATHETERIZATION WITH CORONARY ANGIOGRAM;  Surgeon: [name], MD;  Location: [facility] CATH LAB;  Service: Cardiovascular;  Laterality: N/A;  . NEPHRECTOMY  08/2013   removed transplaned kidney  . POSTERIOR FUSION CERVICAL SPINE  06/25/2012   for spinal stenosis  . VASECTOMY  2010     reports that he has been smoking cigarettes. He has a 16.00 pack-year smoking history. He has never used smokeless tobacco. He reports that he does not drink alcohol or use drugs.  Allergies  Allergen Reactions  . Codeine Nausea And Vomiting    Family History  Adopted: Yes  Problem Relation Age of Onset  . Colon cancer Neg Hx   . Esophageal cancer Neg Hx   . Rectal cancer Neg Hx   . Stomach cancer Neg Hx     Prior to Admission medications   Medication Sig Start Date End Date Taking? Authorizing Provider  acetaminophen (TYLENOL) 500 MG tablet Take 1 tablet (500 mg total) by mouth every 6 (six) hours as needed for moderate pain. 09/12/14  Yes Samuella Cota, MD  albuterol (PROVENTIL HFA;VENTOLIN HFA) 108 (90 Base) MCG/ACT inhaler Inhale 2 puffs into the lungs every 6 (six) hours as needed for wheezing or shortness of breath. 06/05/17  Yes Patrecia Pour, MD  aspirin EC 81 MG tablet Take 81 mg by mouth daily.   Yes [provider]  b complex-vitamin c-folic acid (NEPHRO-VITE) 0.8 MG TABS tablet TAKE 1 TABLET BY MOUTH EVERY DAY Patient taking differently: Take 1 tablet by mouth  daily.  05/29/18  Yes Reed, Tiffany L, DO  budesonide-formoterol (SYMBICORT) 160-4.5 MCG/ACT inhaler Take 2 puffs first thing in am and then another 2 puffs about 12 hours later. Patient taking differently: Inhale 2 puffs into the lungs every 12 (twelve) hours.  10/02/17  Yes Tanda Rockers, MD  calcitRIOL (ROCALTROL) 0.25 MCG capsule Take 1 capsule (0.25 mcg total) by mouth every Monday, Wednesday, and Friday with hemodialysis. 09/21/14  Yes Bonnielee Haff, MD  Cholecalciferol (VITAMIN D3) 5000 UNITS TABS Take 5,000 Units by mouth daily.    Yes [provider]  clonazePAM (KLONOPIN) 0.5 MG tablet TAKE 1 TABLET (0.5 MG TOTAL) BY MOUTH 2 (TWO) TIMES DAILY AS NEEDED FOR ANXIETY. 04/21/18  Yes Reed, Tiffany L, DO  diphenhydrAMINE (BENADRYL)  25 MG tablet Take 25 mg by mouth 2 (two) times daily as needed for allergies.    Yes [provider]  guaiFENesin (MUCINEX) 600 MG 12 hr tablet Take 1,200 mg by mouth 2 (two) times daily.   Yes [provider]  guaiFENesin-dextromethorphan (ROBITUSSIN DM) 100-10 MG/5ML syrup Take 10 mLs by mouth every 4 (four) hours as needed for cough.   Yes [provider]  hydrALAZINE (APRESOLINE) 100 MG tablet Take 100 mg by mouth 2 (two) times daily.   Yes [provider]  metoprolol tartrate (LOPRESSOR) 50 MG tablet TAKE 1 AND 1/2 TABLETS     TWICE DAILY Patient taking differently: Take 75 mg by mouth 2 (two) times daily.  05/16/18  Yes Reed, Tiffany L, DO  omeprazole (PRILOSEC) 20 MG capsule TAKE 1 CAPSULE TWICE DAILY BEFORE A MEAL Patient taking differently: Take 20 mg by mouth 2 (two) times daily before a meal.  03/18/18  Yes Milus Banister, MD  ondansetron (ZOFRAN ODT) 4 MG disintegrating tablet Take 1 tablet (4 mg total) by mouth every 8 (eight) hours as needed for nausea or vomiting. 10/26/16  Yes Carmon Sails J, PA-C  pravastatin (PRAVACHOL) 40 MG tablet TAKE 1 TABLET EVERY EVENING Patient taking differently: Take 40 mg by  mouth at bedtime.  05/16/18  Yes Reed, Tiffany L, DO  PRESCRIPTION MEDICATION Inhale into the lungs at bedtime. CPAP   Yes [provider]  sucroferric oxyhydroxide (VELPHORO) 500 MG chewable tablet Chew 500-1,500 mg by mouth See admin instructions. Chew 3 tablets (1500 mg) by mouth up to three times daily with meals and 1 tablet (500 mg) with snacks   Yes [provider]  traMADol (ULTRAM) 50 MG tablet Take 1 tablet (50 mg total) by mouth daily as needed. Patient taking differently: Take 50 mg by mouth daily as needed (pain).  05/22/18  Yes Gayland Curry, DO    Physical Exam: Vitals:   06/02/18 0000 06/02/18 0015 06/02/18 0030 06/02/18 0045  BP: (!) 187/82 (!) 192/83 (!) 185/79 (!) 178/75  Pulse: 76 78 72 71  Resp: 20 (!) 27 19 (!) 24  Temp:      TempSrc:      SpO2: 100% 100% 100% 100%    Physical Exam  Constitutional: He is oriented to person, place, and time. He appears well-developed and well-nourished. No distress.  Sitting up at the side of the bed  HENT:  Head: Normocephalic.  Mouth/Throat: Oropharynx is clear and moist.  Eyes: Right eye exhibits no discharge. Left eye exhibits no discharge.  Neck: Neck supple.  Cardiovascular: Normal rate, regular rhythm and intact distal pulses.  Pulmonary/Chest: Effort normal and breath sounds normal. No respiratory distress. He has no wheezes. He has no rales.  Increased work of breathing when patient is not speaking.  However, when answering questions no increased work of breathing and speaking clearly in full sentences.  Abdominal: Soft. Bowel sounds are normal. He exhibits no distension. There is no abdominal tenderness. There is no guarding.  Musculoskeletal:        General: Edema present.     Comments: +1 pitting edema of bilateral lower extremities Right knee: Mildly swollen and tender to palpation.  Decreased range of motion.  Neurological: He is alert and oriented to person, place, and time.  Skin: Skin is warm  and dry. He is not diaphoretic.     Labs on Admission: I have personally reviewed following labs and imaging studies  CBC: Recent Labs  Lab 06/01/18 2030  WBC 5.1  NEUTROABS 3.5  HGB 10.0*  HCT 31.9*  MCV 100.3*  PLT 945*   Basic Metabolic Panel: Recent Labs  Lab 06/01/18 2030  NA 142  K 4.1  CL 102  CO2 24  GLUCOSE 98  BUN 34*  CREATININE 13.79*  CALCIUM 9.1   GFR: CrCl cannot be calculated (Unknown ideal weight.). Liver Function Tests: Recent Labs  Lab 06/01/18 2030  AST 24  ALT 19  ALKPHOS 59  BILITOT 1.5*  PROT 6.8  ALBUMIN 3.6   No results for input(s): LIPASE, AMYLASE in the last 168 hours. No results for input(s): AMMONIA in the last 168 hours. Coagulation Profile: No results for input(s): INR, PROTIME in the last 168 hours. Cardiac Enzymes: Recent Labs  Lab 06/01/18 2030  TROPONINI 0.03*   BNP (last 3 results) No results for input(s): PROBNP in the last 8760 hours. HbA1C: No results for input(s): HGBA1C in the last 72 hours. CBG: No results for input(s): GLUCAP in the last 168 hours. Lipid Profile: No results for input(s): CHOL, HDL, LDLCALC, TRIG, CHOLHDL, LDLDIRECT in the last 72 hours. Thyroid Function Tests: No results for input(s): TSH, T4TOTAL, FREET4, T3FREE, THYROIDAB in the last 72 hours. Anemia Panel: No results for input(s): VITAMINB12, FOLATE, FERRITIN, TIBC, IRON, RETICCTPCT in the last 72 hours. Urine analysis:    Component Value Date/Time   COLORURINE YELLOW 02/19/2017 1520   APPEARANCEUR HAZY (A) 02/19/2017 1520   LABSPEC 1.012 02/19/2017 1520   PHURINE 8.0 02/19/2017 1520   GLUCOSEU 50 (A) 02/19/2017 1520   HGBUR MODERATE (A) 02/19/2017 1520   BILIRUBINUR NEGATIVE 02/19/2017 [place] 02/19/2017 1520   PROTEINUR >=300 (A) 02/19/2017 1520   UROBILINOGEN 0.2 04/06/2014 1030   NITRITE NEGATIVE 02/19/2017 1520   LEUKOCYTESUR MODERATE (A) 02/19/2017 1520    Radiological Exams on Admission: Dg Chest  2 View  Result Date: 06/01/2018 CLINICAL DATA:  Shortness of breath EXAM: CHEST - 2 VIEW COMPARISON:  05/14/2018 FINDINGS: Cardiomegaly. No significant airspace opacity. Osseous structures are unremarkable. IMPRESSION: Cardiomegaly without acute abnormality of the lungs. Electronically Signed   By: Eddie Candle M.D.   On: 06/01/2018 21:17   Dg Knee Complete 4 Views Right  Result Date: 06/01/2018 CLINICAL DATA:  Right knee pain, injury EXAM: RIGHT KNEE - COMPLETE 4+ VIEW COMPARISON:  None. FINDINGS: No definite fracture or dislocation of the right knee. There is subtle irregularity of the superior aspect of the right tibial tuberosity, seen on lateral view only, concerning for fracture. Minimal tricompartmental joint space narrowing. Soft tissue edema overlying the anterior knee. Probable nonspecific knee joint effusion. Consider CT or MRI to further evaluate for fracture, if suspected. IMPRESSION: No definite fracture or dislocation of the right knee. There is subtle irregularity of the superior aspect of the right tibial tuberosity, seen on lateral view only, concerning for fracture. Minimal tricompartmental joint space narrowing. Soft tissue edema overlying the anterior knee. Probable nonspecific knee joint effusion. Consider CT or MRI to further evaluate for fracture, if suspected. Electronically Signed   By: Eddie Candle M.D.   On: 06/01/2018 21:23    EKG: Independently reviewed.  Sinus rhythm, no acute ischemic changes.  Assessment/Plan Principal Problem:   SOB (shortness of breath) Active Problems:   OSA on CPAP   HTN (hypertension)   ESRD (end stage renal disease) (HCC)   Right knee pain   Shortness of breath Had increased work of breathing in the ED but not hypoxic.  Placed on 2 L supplemental oxygen for comfort.  Currently speaking clearly in full sentences.  Lungs clear on exam and no wheezing.  Afebrile and no leukocytosis.  No lymphopenia.  Chest x-ray showing cardiomegaly without  acute abnormality of the lungs. PE less likely as patient is not tachycardic or hypoxic. -Nephrology did not feel his shortness of breath could be entirely contributed to volume overload.  Hemodialysis has been ordered, if possible tonight or for first thing in the morning depending on scheduling. -Reassess respiratory status after dialysis to assess need for additional work-up. -COVID-19 rapid test pending  Borderline troponin elevation Likely due to end-stage renal disease.  Troponin 0.03.  EKG without acute ischemic changes.  Patient is not complaining of chest pain. -Cardiac monitoring -Trend troponin  Right knee pain, suspected right tibial tuberosity fracture Patient is complaining of a one-week history of right knee pain after his dog ran into his knee. X-ray showing subtle irregularity of the superior aspect of the right tibial tuberosity concerning for fracture and probable nonspecific knee joint effusion.  Knee appears mildly swollen and has decreased range of motion on exam. -Continue knee immobilizer -Will need CT or MRI for further evaluation, discuss with orthopedics in the morning. -Pain management: Tramadol PRN, Tylenol PRN  ESRD on HD MWF Potassium 4.1, bicarb 24.  BUN 34.  Creatinine 13.7, was 6.2 two weeks ago. Nephrology has ordered urgent dialysis either for tonight or first round in the morning. -Appreciate nephrology recommendations  Hypertension Blood pressure elevated. Continue home metoprolol -Hydralazine PRN  Anemia of chronic disease -Hemoglobin 10.0, at baseline.  Secondary hyperparathyroidism in the setting of ESRD -Continue home calcitriol and velphoro  Elevated T bili  T bili 1.5, remainder of LFTs normal.  Abdominal exam benign. 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  Platelet count 101.  No signs of active bleeding. -Continue to monitor      Tobacco use -NicoDerm patch -Counseled to quit  OSA -CPAP at  night  Asthma Stable.  No bronchospasm. -Dulera twice daily, albuterol as needed  Anxiety -Continue home Klonopin 0.5 mg twice daily as needed  DVT prophylaxis: Subcutaneous heparin Code Status: Full code Family Communication: No family available. Disposition Plan: Anticipate discharge after clinical improvement. Consults called: Nephrology  Admission status: Observation  This chart was dictated using voice recognition software.  Despite best efforts to proofread, errors can occur which can change the documentation meaning.  Shela Leff MD [facility] Pager [phone]  If 7PM-7AM, please contact night-coverage [email or web address] Password [email or web address]  06/02/2018, 3:10 AM

## 2018-06-02 NOTE — ED Notes (Signed)
The pt wants his c-pap unable to get in the ed  Has to wai until he gets to the floor  No room yet  He wants his bp meds

## 2018-06-03 DIAGNOSIS — J45901 Unspecified asthma with (acute) exacerbation: Secondary | ICD-10-CM | POA: Diagnosis present

## 2018-06-03 DIAGNOSIS — N186 End stage renal disease: Secondary | ICD-10-CM | POA: Diagnosis not present

## 2018-06-03 DIAGNOSIS — D631 Anemia in chronic kidney disease: Secondary | ICD-10-CM | POA: Diagnosis not present

## 2018-06-03 DIAGNOSIS — R0602 Shortness of breath: Secondary | ICD-10-CM | POA: Diagnosis not present

## 2018-06-03 DIAGNOSIS — Z992 Dependence on renal dialysis: Secondary | ICD-10-CM | POA: Diagnosis not present

## 2018-06-03 DIAGNOSIS — N2581 Secondary hyperparathyroidism of renal origin: Secondary | ICD-10-CM | POA: Diagnosis not present

## 2018-06-03 DIAGNOSIS — I120 Hypertensive chronic kidney disease with stage 5 chronic kidney disease or end stage renal disease: Secondary | ICD-10-CM | POA: Diagnosis not present

## 2018-06-03 LAB — BASIC METABOLIC PANEL
Anion gap: 19 — ABNORMAL HIGH (ref 5–15)
BUN: 40 mg/dL — ABNORMAL HIGH (ref 6–20)
CO2: 23 mmol/L (ref 22–32)
Calcium: 9.5 mg/dL (ref 8.9–10.3)
Chloride: 95 mmol/L — ABNORMAL LOW (ref 98–111)
Creatinine, Ser: 12.45 mg/dL — ABNORMAL HIGH (ref 0.61–1.24)
GFR calc Af Amer: 5 mL/min — ABNORMAL LOW (ref >60)
GFR calc non Af Amer: 4 mL/min — ABNORMAL LOW (ref >60)
Glucose, Bld: 176 mg/dL — ABNORMAL HIGH (ref 70–99)
Potassium: 4.4 mmol/L (ref 3.5–5.1)
Sodium: 137 mmol/L (ref 135–145)

## 2018-06-03 MED ORDER — CALCITRIOL 0.5 MCG PO CAPS: 1.0000 ug | ORAL_CAPSULE | ORAL | 1 refills | Status: DC

## 2018-06-03 MED ORDER — HYDRALAZINE HCL 50 MG PO TABS: 50.0000 mg | ORAL_TABLET | Freq: Three times a day (TID) | ORAL | Status: DC

## 2018-06-03 MED ORDER — HYDRALAZINE HCL 25 MG PO TABS: 25.0000 mg | ORAL_TABLET | Freq: Three times a day (TID) | ORAL | 1 refills | Status: DC

## 2018-06-03 MED ORDER — NICOTINE 21 MG/24HR TD PT24: 21.0000 mg | MEDICATED_PATCH | Freq: Every day | TRANSDERMAL | 0 refills | Status: DC

## 2018-06-03 MED ORDER — PREDNISONE 10 MG PO TABS: ORAL_TABLET | ORAL | 0 refills | Status: DC

## 2018-06-03 MED ORDER — METOPROLOL TARTRATE 75 MG PO TABS: 75.0000 mg | ORAL_TABLET | Freq: Two times a day (BID) | ORAL | 1 refills | Status: DC

## 2018-06-03 MED ORDER — HYDRALAZINE HCL 25 MG PO TABS
25.0000 mg | ORAL_TABLET | Freq: Three times a day (TID) | ORAL | Status: DC
  Administered 2018-06-03: 25 mg via ORAL
  Filled 2018-06-03: qty 1

## 2018-06-03 MED ORDER — NICOTINE 21 MG/24HR TD PT24
21.0000 mg | MEDICATED_PATCH | Freq: Every day | TRANSDERMAL | Status: DC
  Administered 2018-06-03: 21 mg via TRANSDERMAL
  Filled 2018-06-03: qty 1

## 2018-06-03 NOTE — Discharge Summary (Addendum)
Physician Discharge Summary  Kirk [name] BZJ:[id number] DOB: [birth year] DOA: 06/01/2018  PCP: Gayland Curry, DO  Admit date: 06/01/2018 Discharge date: 06/03/2018  Time spent: 45 minutes  Recommendations for Outpatient Follow-up:  1. Follow up with PCP 1-2 weeks for evaluation of symptoms.   2. Dialysis per regular schedule 3. Smoking cessation   Discharge Diagnoses:  Principal Problem:   SOB (shortness of breath) Active Problems:   ESRD (end stage renal disease) (HCC)   Right knee pain   Asthma with exacerbation   OSA on CPAP   HTN (hypertension)   Right tibial fracture   Discharge Condition: stable  Diet recommendation: renal heart healthy  Filed Weights   06/02/18 0518 06/02/18 0550 06/02/18 0915  Weight: 103.2 kg 101.9 kg 98.6 kg    History of present illness:  Patient admitted  with 1 day hx sob/LE edema and orthopnea. Also tobacco user with hx moderate persistent asthma. Evaluated by nephrology who opine his sob not entirely related to volume overloaded. Had dialysis today but it was cut short.   Hospital Course:   Shortness of breath/ asthma with exacerbation. Resolved at discharge. Oxygen saturation level greater than 90% with activity on room air at discharge.  Likely related to asthma exacerbation in setting of mild volume overload from short dialysis. Placed on 2 L supplemental oxygen for comfort. Afebrile and no leukocytosis. Chest x-ray showing cardiomegaly without acute abnormality of the lungs.PE less likely as patient not tachycardic or hypoxic. Wheezing and rhonchi on exam so scheduled nebs and solumedrol started resulting in improvement.  Of noteNephrology did not feel his shortness of breath could be entirely contributed to volume overload. COVID-19 rapid test negative. Will discharge with steroid taper  Borderline troponin elevation. Likely due to end-stage renal disease.Troponin 0.03 x3. EKG without acute ischemic changes.Patient not complaining  of chest pain. No events on tele.  Right knee pain,suspectedright tibial tuberosity fracture Patient reported one-week history of right knee pain after his dog ran into his knee.X-ray showing subtle irregularity of the superior aspect of the right tibial tuberosity concerning for fracture and probable nonspecific knee joint effusion.spoke to ortho who recommended CT wo contrast of knee. CT revealed moderate effusion. Patient refused ortho consult at discharge and knee not painful. Follow up with PCP  ESRD on HDMWF Potassium 4.4, bicarb 23.BUN 40. Creatinine 12.4 on day of discharge. Appreciate nephrology recommendations. To resume regular dialysis schedule  Hypertension. Only fair control. Home meds include metoprolol and apresoline. Was only getting metoprolol during hospitalization. Resume apresoline at discharge. Follow up with PCP for evaluation of BP control -Continue home metoprolol -Hydralazine PRN  Anemia of chronic disease -Hemoglobin 10.0, at baseline.  Secondary hyperparathyroidism in the setting of ESRD -Continue home calcitriol andvelphoro  Elevated T bili T bili 1.5, remainder of LFTs normal.Abdominal exam benign. 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   Tobacco use -NicoDerm patch -Counseled to quit  OSA -CPAP at night  Asthma. Chart review reveal hx of moderate persistent. Home meds include inhaler. See #1.  Anxiety -Continue home Klonopin 0.5 mg twice    Procedures:  Dialysis 06/02/18 short session-- can return to outpatient HD in the AM  Consultations:  [facility] nephrology  Discharge Exam: Vitals:   06/03/18 0725 06/03/18 0812  BP: (!) 178/77   Pulse: 81   Resp: (!) 22   Temp: 98.7 F (37.1 C)   SpO2: 100% 98%    General: awake alert  sitting in chair Cardiovascular: rrr no mgr no LE edema Respiratory: no increased work of breathing. BS  distant but clear no wheeze  Discharge Instructions   Discharge Instructions    Call MD for:  difficulty breathing, headache or visual disturbances   Complete by:  As directed    Call MD for:  persistant dizziness or light-headedness   Complete by:  As directed    Call MD for:  temperature >100.4   Complete by:  As directed    Diet - low sodium heart healthy   Complete by:  As directed    Discharge instructions   Complete by:  As directed    Take medications as prescribed Follow up with PCP 1-2 weeks for evaluation of symptoms Attend dialysis as scheduled   Increase activity slowly   Complete by:  As directed      Allergies as of 06/03/2018      Reactions   Codeine Nausea And Vomiting      Medication List    TAKE these medications   acetaminophen 500 MG tablet Commonly known as:  TYLENOL Take 1 tablet (500 mg total) by mouth every 6 (six) hours as needed for moderate pain.   albuterol 108 (90 Base) MCG/ACT inhaler Commonly known as:  VENTOLIN HFA Inhale 2 puffs into the lungs every 6 (six) hours as needed for wheezing or shortness of breath.   aspirin EC 81 MG tablet Take 81 mg by mouth daily.   b complex-vitamin c-folic acid 0.8 MG Tabs tablet TAKE 1 TABLET BY MOUTH EVERY DAY   Benadryl 25 MG tablet Generic drug:  diphenhydrAMINE Take 25 mg by mouth 2 (two) times daily as needed for allergies.   budesonide-formoterol 160-4.5 MCG/ACT inhaler Commonly known as:  Symbicort Take 2 puffs first thing in am and then another 2 puffs about 12 hours later. What changed:    how much to take  how to take this  when to take this  additional instructions   calcitRIOL 0.5 MCG capsule Commonly known as:  ROCALTROL Take 2 capsules (1 mcg total) by mouth every Monday, Wednesday, and Friday with hemodialysis. Start taking on:  Jun 04, 2018 What changed:    medication strength  how much to take   clonazePAM 0.5 MG tablet Commonly known as:  KLONOPIN TAKE 1 TABLET  (0.5 MG TOTAL) BY MOUTH 2 (TWO) TIMES DAILY AS NEEDED FOR ANXIETY.   guaiFENesin 600 MG 12 hr tablet Commonly known as:  MUCINEX Take 1,200 mg by mouth 2 (two) times daily.   guaiFENesin-dextromethorphan 100-10 MG/5ML syrup Commonly known as:  ROBITUSSIN DM Take 10 mLs by mouth every 4 (four) hours as needed for cough.   hydrALAZINE 25 MG tablet Commonly known as:  APRESOLINE Take 1 tablet (25 mg total) by mouth every 8 (eight) hours. What changed:    medication strength  how much to take  when to take this   Metoprolol Tartrate 75 MG Tabs Take 75 mg by mouth 2 (two) times daily. What changed:    medication strength  See the new instructions.   nicotine 21 mg/24hr patch Commonly known as:  NICODERM CQ - dosed in mg/24 hours Place 1 patch (21 mg total) onto the skin daily.   omeprazole 20 MG capsule Commonly known as:  PRILOSEC TAKE 1 CAPSULE TWICE DAILY BEFORE A MEAL What changed:    how much to take  how to take this  when to take this  additional instructions   ondansetron  4 MG disintegrating tablet Commonly known as:  Zofran ODT Take 1 tablet (4 mg total) by mouth every 8 (eight) hours as needed for nausea or vomiting.   pravastatin 40 MG tablet Commonly known as:  PRAVACHOL TAKE 1 TABLET EVERY EVENING What changed:  when to take this   predniSONE 10 MG tablet Commonly known as:  DELTASONE Take 6 tabs for 2 days starting 5/13 then take 4 tabs for 2 days then take 2 tabs for 2 days then take 1 tab for 2 days then stop.   PRESCRIPTION MEDICATION Inhale into the lungs at bedtime. CPAP   traMADol 50 MG tablet Commonly known as:  ULTRAM Take 1 tablet (50 mg total) by mouth daily as needed. What changed:  reasons to take this   Velphoro 500 MG chewable tablet Generic drug:  sucroferric oxyhydroxide Chew 500-1,500 mg by mouth See admin instructions. Chew 3 tablets (1500 mg) by mouth up to three times daily with meals and 1 tablet (500 mg) with snacks    Vitamin D3 125 MCG (5000 UT) Tabs Take 5,000 Units by mouth daily.      Allergies  Allergen Reactions  . Codeine Nausea And Vomiting      The results of significant diagnostics from this hospitalization (including imaging, microbiology, ancillary and laboratory) are listed below for reference.    Significant Diagnostic Studies: Dg Chest 2 View  Result Date: 06/01/2018 CLINICAL DATA:  Shortness of breath EXAM: CHEST - 2 VIEW COMPARISON:  05/14/2018 FINDINGS: Cardiomegaly. No significant airspace opacity. Osseous structures are unremarkable. IMPRESSION: Cardiomegaly without acute abnormality of the lungs. Electronically Signed   By: Eddie Candle M.D.   On: 06/01/2018 21:17   Ct Knee Right Wo Contrast  Result Date: 06/02/2018 CLINICAL DATA:  Right knee pain.  Blunt trauma 2 weeks ago. EXAM: CT OF THE RIGHT KNEE WITHOUT CONTRAST TECHNIQUE: Multidetector CT imaging of the right knee was performed according to the standard protocol. Multiplanar CT image reconstructions were also generated. COMPARISON:  Radiographs dated 06/01/2018 FINDINGS: Bones/Joint/Cartilage There is no fracture or dislocation. There is a prominent joint effusion. Slight narrowing of the medial compartment. The menisci are suboptimally visualized but the lateral meniscus appears to be intact. Medial meniscus is not well enough seen for assessment. Ligaments The cruciate and collateral ligaments appear to be intact. Muscles and Tendons Normal. Soft tissues No significant soft tissue abnormality. IMPRESSION: 1. No fracture. 2. Prominent nonspecific joint effusion. 3. Slight degenerative changes in the medial compartment. Four the cruciate and collateral ligaments appear to be intact. Electronically Signed   By: Lorriane Shire M.D.   On: 06/02/2018 13:35   Dg Chest Port 1 View  Result Date: 05/14/2018 CLINICAL DATA:  Fever and shortness of breath for 1 month EXAM: PORTABLE CHEST 1 VIEW COMPARISON:  01/27/2018 FINDINGS: Cardiac  shadow is mildly prominent but stable. The lungs are well aerated bilaterally. Previously seen vascular congestion is again noted with decreased interstitial change. No bony abnormality is noted. IMPRESSION: Stable vascular congestion. Electronically Signed   By: Inez Catalina M.D.   On: 05/14/2018 15:44   Dg Knee Complete 4 Views Right  Result Date: 06/01/2018 CLINICAL DATA:  Right knee pain, injury EXAM: RIGHT KNEE - COMPLETE 4+ VIEW COMPARISON:  None. FINDINGS: No definite fracture or dislocation of the right knee. There is subtle irregularity of the superior aspect of the right tibial tuberosity, seen on lateral view only, concerning for fracture. Minimal tricompartmental joint space narrowing. Soft tissue edema overlying the  anterior knee. Probable nonspecific knee joint effusion. Consider CT or MRI to further evaluate for fracture, if suspected. IMPRESSION: No definite fracture or dislocation of the right knee. There is subtle irregularity of the superior aspect of the right tibial tuberosity, seen on lateral view only, concerning for fracture. Minimal tricompartmental joint space narrowing. Soft tissue edema overlying the anterior knee. Probable nonspecific knee joint effusion. Consider CT or MRI to further evaluate for fracture, if suspected. Electronically Signed   By: Eddie Candle M.D.   On: 06/01/2018 21:23    Microbiology: Recent Results (from the past 240 hour(s))  SARS Coronavirus 2 (CEPHEID - Performed in [place] hospital lab), Hosp Order     Status: None   Collection Time: 06/02/18  2:20 AM  Result Value Ref Range Status   SARS Coronavirus 2 NEGATIVE NEGATIVE Final    Comment: (NOTE) If result is NEGATIVE SARS-CoV-2 target nucleic acids are NOT DETECTED. The SARS-CoV-2 RNA is generally detectable in upper and lower  respiratory specimens during the acute phase of infection. The lowest  concentration of SARS-CoV-2 viral copies this assay can detect is 250  copies / mL. A negative  result does not preclude SARS-CoV-2 infection  and should not be used as the sole basis for treatment or other  patient management decisions.  A negative result may occur with  improper specimen collection / handling, submission of specimen other  than nasopharyngeal swab, presence of viral mutation(s) within the  areas targeted by this assay, and inadequate number of viral copies  (<250 copies / mL). A negative result must be combined with clinical  observations, patient history, and epidemiological information. If result is POSITIVE SARS-CoV-2 target nucleic acids are DETECTED. The SARS-CoV-2 RNA is generally detectable in upper and lower  respiratory specimens dur ing the acute phase of infection.  Positive  results are indicative of active infection with SARS-CoV-2.  Clinical  correlation with patient history and other diagnostic information is  necessary to determine patient infection status.  Positive results do  not rule out bacterial infection or co-infection with other viruses. If result is PRESUMPTIVE POSTIVE SARS-CoV-2 nucleic acids MAY BE PRESENT.   A presumptive positive result was obtained on the submitted specimen  and confirmed on repeat testing.  While 2019 novel coronavirus  (SARS-CoV-2) nucleic acids may be present in the submitted sample  additional confirmatory testing may be necessary for epidemiological  and / or clinical management purposes  to differentiate between  SARS-CoV-2 and other Sarbecovirus currently known to infect humans.  If clinically indicated additional testing with an alternate test  methodology [id number]) is advised. The SARS-CoV-2 RNA is generally  detectable in upper and lower respiratory sp ecimens during the acute  phase of infection. The expected result is Negative. Fact Sheet for Patients:  StrictlyIdeas.no Fact Sheet for Healthcare Providers: BankingDealers.co.za This test is not yet  approved or cleared by the [place] FDA and has been authorized for detection and/or diagnosis of SARS-CoV-2 by FDA under an Emergency Use Authorization (EUA).  This EUA will remain in effect (meaning this test can be used) for the duration of the COVID-19 declaration under Section 564(b)(1) of the Act, 21 U.S.C. section 360bbb-3(b)(1), unless the authorization is terminated or revoked sooner. Performed at [facility], [place] [address], [place], [place] [address]      Labs: Basic Metabolic Panel: Recent Labs  Lab 06/01/18 2030 06/02/18 0307 06/03/18 0528  NA 142 143 137  K 4.1 4.2 4.4  CL  102 105 95*  CO2 24 24 23   GLUCOSE 98 134* 176*  BUN 34* 40* 40*  CREATININE 13.79* 14.16* 12.45*  CALCIUM 9.1 9.0 9.5  PHOS  --  8.4*  --    Liver Function Tests: Recent Labs  Lab 06/01/18 2030 06/02/18 0307 06/02/18 1044  AST 24  --   --   ALT 19  --   --   ALKPHOS 59  --   --   BILITOT 1.5*  --  1.1  PROT 6.8  --   --   ALBUMIN 3.6 3.5  --    No results for input(s): LIPASE, AMYLASE in the last 168 hours. No results for input(s): AMMONIA in the last 168 hours. CBC: Recent Labs  Lab 06/01/18 2030 06/02/18 0726 06/02/18 1044  WBC 5.1 6.9 8.8  NEUTROABS 3.5  --   --   HGB 10.0* 9.5* 10.4*  HCT 31.9* 31.3* 33.0*  MCV 100.3* 100.0 98.2  PLT 101* 100* 139*   Cardiac Enzymes: Recent Labs  Lab 06/01/18 2030 06/02/18 0307 06/02/18 1044  TROPONINI 0.03* 0.03* <0.03   BNP: BNP (last 3 results) Recent Labs    01/27/18 2100 05/14/18 1453  BNP 951.2* 1,092.0*    ProBNP (last 3 results) No results for input(s): PROBNP in the last 8760 hours.  CBG: No results for input(s): GLUCAP in the last 168 hours.     SignedRadene Gunning NP [facility] 06/03/2018, 11:03 AM   Patient was seen, examined,treatment plan was discussed with the Advance Practice Provider.  I have personally reviewed the clinical findings, labs, EKG, imaging studies and  management of this patient in detail. I have also reviewed the orders written for this patient which were under my direction. I agree with the documentation, as recorded by the Advance Practice Provider.   GERAL COKER is a 58 y.o. male here with SOB-- got HD w/o much improvement.  Improved after nebs and steroids added.  Did have a knee effusion that was much improved on the day of discharge.  Encouraged smoking cessation.     Geradine Girt, DO   How to contact the [facility] Attending or Consulting provider [place] or covering provider during after hours [place], for this patient?  1. Check the care team in [facility] and look for a) attending/consulting TRH provider listed and b) the [facility] team listed 2. Log into [email or web address] and use [place]'s universal password to access. If you do not have the password, please contact the hospital operator. 3. Locate the [facility] provider you are looking for under [facility] and page to a number that you can be directly reached. 4. If you still have difficulty reaching the provider, please page the [facility] (Director on Call) for the Hospitalists listed on amion for assistance.

## 2018-06-03 NOTE — Care Management Obs Status (Signed)
[place] NOTIFICATION   Patient Details  Name: Kirk [name] MRN: [id number] Date of Birth: 1960/05/29   Medicare Observation Status Notification Given:  Yes    Zenon Mayo, RN 06/03/2018, 11:34 AM

## 2018-06-03 NOTE — Progress Notes (Signed)
[facility] Progress Note  Name: Kirk [name] MRN: [id number] DOB: [birth year]  Chief Complaint:  Shortness of breath   Subjective:  Last HD on 5/11 with 3.3 kg UF.  He states that he is no longer having any back pain.  No dizziness or cramping with yesterday's HD.  Breathing is much better today and he does not feel that he needs an extra treatment.  He is not able to confirm that he takes hydralazine at home and wife is not available by phone   Review of systems:  Denies any chest pain Reports knee pain  No n/v Shortness of breath improving    Intake/Output Summary (Last 24 hours) at 06/03/2018 0801 Last data filed at 06/03/2018 0650 Gross per 24 hour  Intake 240 ml  Output 3300 ml  Net -3060 ml    Vitals:  Vitals:   06/02/18 2001 06/02/18 2007 06/02/18 2009 06/03/18 0725  BP: (!) 165/73   (!) 178/77  Pulse: 78 80 72 81  Resp: 19 20 (!) 22 (!) 22  Temp: 98.5 F (36.9 C)   98.7 F (37.1 C)  TempSrc: Oral   Oral  SpO2: 97% 98% 100% 100%  Weight:         Physical Exam:  General adult male in chair in NAD, on room air HEENT normocephalic atraumatic extraocular movements intact sclera anicteric Neck supple trachea midline Lungs unlabored and clear to auscultation Heart RRR; no rub Abdomen soft nontender nondistended Extremities no pitting edema  Psych normal mood and affect Access: LUE AVF    Medications reviewed   Labs:  BMP Latest Ref Rng & Units 06/03/2018 06/02/2018 06/01/2018  Glucose 70 - 99 mg/dL 176(H) 134(H) 98  BUN 6 - 20 mg/dL 40(H) 40(H) 34(H)  Creatinine 0.61 - 1.24 mg/dL 12.45(H) 14.16(H) 13.79(H)  BUN/Creat Ratio 6 - 22 (calc) - - -  Sodium 135 - 145 mmol/L 137 143 142  Potassium 3.5 - 5.1 mmol/L 4.4 4.2 4.1  Chloride 98 - 111 mmol/L 95(L) 105 102  CO2 22 - 32 mmol/L 23 24 24   Calcium 8.9 - 10.3 mg/dL 9.5 9.0 9.1     Assessment/Plan:   1.  Dyspnea.  Does not appear solely volume-mediated.  Covid negative  (5/11 and 4/22).   On steroids. Refractory to recent antibiotic therapy.  He is below his reported dry weight.  2.  End-stage renal disease:  On HD MWF at [facility].  No acute need for HD today.  Plan for next tx on 5/13.  3.  Hypertension:  Optimizing volume with HD.  On metoprolol.  Adding hydralazine at 25 mg TID at reassess - unable to confirm if taking at home.  4.  Anemia of chronic kidney disease: Without overt loss; improving trends 5.  Secondary hyperparathyroidism: calcitriol for PTH suppression and Velphoro for phosphorus control.  Hyperphosphatemic as an outpatient. 6.  Right knee injury/pain: knee immobilizer; per primary team    Claudia Desanctis, MD 06/03/2018 8:01 AM

## 2018-06-03 NOTE — Progress Notes (Signed)
Renal Navigator notified OP HD clinic/[facility] of patient's discharge from the hospital today to provide continuity of care and assist with smooth transition from hospital back to OP HD clinic.  Alphonzo Cruise Renal Navigator [phone]

## 2018-06-03 NOTE — TOC Transition Note (Signed)
Transition of Care [facility]) - CM/SW Discharge Note   Patient Details  Name: Kirk [name] MRN: [id number] Date of Birth: 1960/07/29  Transition of [facility]) CM/SW Contact:  Zenon Mayo, RN Phone Number: 06/03/2018, 11:37 AM   Clinical Narrative:    From home with spouse, pta indep, he has no problem getting medications, he has pcp and states his wife will make follow up apt for him.  He has transportation at discharge. He has no other needs.   Final next level of care: Home/Self Care Barriers to Discharge: No Barriers Identified   Patient Goals and CMS Choice Patient states their goals for this hospitalization and ongoing recovery are:: get better   Choice offered to / list presented to : NA  Discharge Placement                       Discharge Plan and Services In-house Referral: NA Discharge Planning Services: CM Consult Post Acute Care Choice: NA          DME Arranged: N/A DME Agency: NA       HH Arranged: NA HH Agency: NA        Social Determinants of Health (SDOH) Interventions     Readmission Risk Interventions Readmission Risk Prevention Plan 06/03/2018  Transportation Screening Complete  Medication Review Press photographer) Complete  PCP or Specialist appointment within 3-5 days of discharge Complete  HRI or [place] Complete  SW Recovery Care/Counseling Consult Complete  [place] Not Applicable  Some recent data might be hidden

## 2018-06-04 DIAGNOSIS — E8779 Other fluid overload: Secondary | ICD-10-CM | POA: Diagnosis not present

## 2018-06-04 DIAGNOSIS — N186 End stage renal disease: Secondary | ICD-10-CM | POA: Diagnosis not present

## 2018-06-04 DIAGNOSIS — N2581 Secondary hyperparathyroidism of renal origin: Secondary | ICD-10-CM | POA: Diagnosis not present

## 2018-06-04 DIAGNOSIS — D509 Iron deficiency anemia, unspecified: Secondary | ICD-10-CM | POA: Diagnosis not present

## 2018-06-04 DIAGNOSIS — D631 Anemia in chronic kidney disease: Secondary | ICD-10-CM | POA: Diagnosis not present

## 2018-06-06 DIAGNOSIS — N186 End stage renal disease: Secondary | ICD-10-CM | POA: Diagnosis not present

## 2018-06-06 DIAGNOSIS — N2581 Secondary hyperparathyroidism of renal origin: Secondary | ICD-10-CM | POA: Diagnosis not present

## 2018-06-06 DIAGNOSIS — D509 Iron deficiency anemia, unspecified: Secondary | ICD-10-CM | POA: Diagnosis not present

## 2018-06-06 DIAGNOSIS — E8779 Other fluid overload: Secondary | ICD-10-CM | POA: Diagnosis not present

## 2018-06-06 DIAGNOSIS — D631 Anemia in chronic kidney disease: Secondary | ICD-10-CM | POA: Diagnosis not present

## 2018-06-09 DIAGNOSIS — E8779 Other fluid overload: Secondary | ICD-10-CM | POA: Diagnosis not present

## 2018-06-09 DIAGNOSIS — N2581 Secondary hyperparathyroidism of renal origin: Secondary | ICD-10-CM | POA: Diagnosis not present

## 2018-06-09 DIAGNOSIS — D631 Anemia in chronic kidney disease: Secondary | ICD-10-CM | POA: Diagnosis not present

## 2018-06-09 DIAGNOSIS — N186 End stage renal disease: Secondary | ICD-10-CM | POA: Diagnosis not present

## 2018-06-09 DIAGNOSIS — D509 Iron deficiency anemia, unspecified: Secondary | ICD-10-CM | POA: Diagnosis not present

## 2018-06-11 DIAGNOSIS — D631 Anemia in chronic kidney disease: Secondary | ICD-10-CM | POA: Diagnosis not present

## 2018-06-11 DIAGNOSIS — N186 End stage renal disease: Secondary | ICD-10-CM | POA: Diagnosis not present

## 2018-06-11 DIAGNOSIS — E8779 Other fluid overload: Secondary | ICD-10-CM | POA: Diagnosis not present

## 2018-06-11 DIAGNOSIS — D509 Iron deficiency anemia, unspecified: Secondary | ICD-10-CM | POA: Diagnosis not present

## 2018-06-11 DIAGNOSIS — N2581 Secondary hyperparathyroidism of renal origin: Secondary | ICD-10-CM | POA: Diagnosis not present

## 2018-06-13 DIAGNOSIS — N186 End stage renal disease: Secondary | ICD-10-CM | POA: Diagnosis not present

## 2018-06-13 DIAGNOSIS — D631 Anemia in chronic kidney disease: Secondary | ICD-10-CM | POA: Diagnosis not present

## 2018-06-13 DIAGNOSIS — E8779 Other fluid overload: Secondary | ICD-10-CM | POA: Diagnosis not present

## 2018-06-13 DIAGNOSIS — D509 Iron deficiency anemia, unspecified: Secondary | ICD-10-CM | POA: Diagnosis not present

## 2018-06-13 DIAGNOSIS — N2581 Secondary hyperparathyroidism of renal origin: Secondary | ICD-10-CM | POA: Diagnosis not present

## 2018-06-16 DIAGNOSIS — D631 Anemia in chronic kidney disease: Secondary | ICD-10-CM | POA: Diagnosis not present

## 2018-06-16 DIAGNOSIS — E8779 Other fluid overload: Secondary | ICD-10-CM | POA: Diagnosis not present

## 2018-06-16 DIAGNOSIS — N2581 Secondary hyperparathyroidism of renal origin: Secondary | ICD-10-CM | POA: Diagnosis not present

## 2018-06-16 DIAGNOSIS — N186 End stage renal disease: Secondary | ICD-10-CM | POA: Diagnosis not present

## 2018-06-16 DIAGNOSIS — D509 Iron deficiency anemia, unspecified: Secondary | ICD-10-CM | POA: Diagnosis not present

## 2018-06-18 DIAGNOSIS — D631 Anemia in chronic kidney disease: Secondary | ICD-10-CM | POA: Diagnosis not present

## 2018-06-18 DIAGNOSIS — E8779 Other fluid overload: Secondary | ICD-10-CM | POA: Diagnosis not present

## 2018-06-18 DIAGNOSIS — D509 Iron deficiency anemia, unspecified: Secondary | ICD-10-CM | POA: Diagnosis not present

## 2018-06-18 DIAGNOSIS — N186 End stage renal disease: Secondary | ICD-10-CM | POA: Diagnosis not present

## 2018-06-18 DIAGNOSIS — N2581 Secondary hyperparathyroidism of renal origin: Secondary | ICD-10-CM | POA: Diagnosis not present

## 2018-06-20 DIAGNOSIS — N2581 Secondary hyperparathyroidism of renal origin: Secondary | ICD-10-CM | POA: Diagnosis not present

## 2018-06-20 DIAGNOSIS — D509 Iron deficiency anemia, unspecified: Secondary | ICD-10-CM | POA: Diagnosis not present

## 2018-06-20 DIAGNOSIS — N186 End stage renal disease: Secondary | ICD-10-CM | POA: Diagnosis not present

## 2018-06-20 DIAGNOSIS — D631 Anemia in chronic kidney disease: Secondary | ICD-10-CM | POA: Diagnosis not present

## 2018-06-20 DIAGNOSIS — E8779 Other fluid overload: Secondary | ICD-10-CM | POA: Diagnosis not present

## 2018-06-23 DIAGNOSIS — Z992 Dependence on renal dialysis: Secondary | ICD-10-CM | POA: Diagnosis not present

## 2018-06-23 DIAGNOSIS — N186 End stage renal disease: Secondary | ICD-10-CM | POA: Diagnosis not present

## 2018-06-23 DIAGNOSIS — D631 Anemia in chronic kidney disease: Secondary | ICD-10-CM | POA: Diagnosis not present

## 2018-06-23 DIAGNOSIS — D509 Iron deficiency anemia, unspecified: Secondary | ICD-10-CM | POA: Diagnosis not present

## 2018-06-23 DIAGNOSIS — N2581 Secondary hyperparathyroidism of renal origin: Secondary | ICD-10-CM | POA: Diagnosis not present

## 2018-06-23 DIAGNOSIS — T8612 Kidney transplant failure: Secondary | ICD-10-CM | POA: Diagnosis not present

## 2018-06-25 DIAGNOSIS — D509 Iron deficiency anemia, unspecified: Secondary | ICD-10-CM | POA: Diagnosis not present

## 2018-06-25 DIAGNOSIS — N2581 Secondary hyperparathyroidism of renal origin: Secondary | ICD-10-CM | POA: Diagnosis not present

## 2018-06-25 DIAGNOSIS — D631 Anemia in chronic kidney disease: Secondary | ICD-10-CM | POA: Diagnosis not present

## 2018-06-25 DIAGNOSIS — N186 End stage renal disease: Secondary | ICD-10-CM | POA: Diagnosis not present

## 2018-06-27 DIAGNOSIS — N2581 Secondary hyperparathyroidism of renal origin: Secondary | ICD-10-CM | POA: Diagnosis not present

## 2018-06-27 DIAGNOSIS — D509 Iron deficiency anemia, unspecified: Secondary | ICD-10-CM | POA: Diagnosis not present

## 2018-06-27 DIAGNOSIS — D631 Anemia in chronic kidney disease: Secondary | ICD-10-CM | POA: Diagnosis not present

## 2018-06-27 DIAGNOSIS — N186 End stage renal disease: Secondary | ICD-10-CM | POA: Diagnosis not present

## 2018-06-30 ENCOUNTER — Ambulatory Visit: Payer: Medicare Other | Admitting: Internal Medicine

## 2018-06-30 DIAGNOSIS — D631 Anemia in chronic kidney disease: Secondary | ICD-10-CM | POA: Diagnosis not present

## 2018-06-30 DIAGNOSIS — D509 Iron deficiency anemia, unspecified: Secondary | ICD-10-CM | POA: Diagnosis not present

## 2018-06-30 DIAGNOSIS — N186 End stage renal disease: Secondary | ICD-10-CM | POA: Diagnosis not present

## 2018-06-30 DIAGNOSIS — N2581 Secondary hyperparathyroidism of renal origin: Secondary | ICD-10-CM | POA: Diagnosis not present

## 2018-07-01 ENCOUNTER — Encounter: Payer: Medicare Other | Admitting: Family

## 2018-07-01 ENCOUNTER — Ambulatory Visit: Payer: Self-pay

## 2018-07-02 DIAGNOSIS — D509 Iron deficiency anemia, unspecified: Secondary | ICD-10-CM | POA: Diagnosis not present

## 2018-07-02 DIAGNOSIS — N2581 Secondary hyperparathyroidism of renal origin: Secondary | ICD-10-CM | POA: Diagnosis not present

## 2018-07-02 DIAGNOSIS — D631 Anemia in chronic kidney disease: Secondary | ICD-10-CM | POA: Diagnosis not present

## 2018-07-02 DIAGNOSIS — N186 End stage renal disease: Secondary | ICD-10-CM | POA: Diagnosis not present

## 2018-07-04 DIAGNOSIS — D631 Anemia in chronic kidney disease: Secondary | ICD-10-CM | POA: Diagnosis not present

## 2018-07-04 DIAGNOSIS — N2581 Secondary hyperparathyroidism of renal origin: Secondary | ICD-10-CM | POA: Diagnosis not present

## 2018-07-04 DIAGNOSIS — D509 Iron deficiency anemia, unspecified: Secondary | ICD-10-CM | POA: Diagnosis not present

## 2018-07-04 DIAGNOSIS — N186 End stage renal disease: Secondary | ICD-10-CM | POA: Diagnosis not present

## 2018-07-07 DIAGNOSIS — D631 Anemia in chronic kidney disease: Secondary | ICD-10-CM | POA: Diagnosis not present

## 2018-07-07 DIAGNOSIS — D509 Iron deficiency anemia, unspecified: Secondary | ICD-10-CM | POA: Diagnosis not present

## 2018-07-07 DIAGNOSIS — N2581 Secondary hyperparathyroidism of renal origin: Secondary | ICD-10-CM | POA: Diagnosis not present

## 2018-07-07 DIAGNOSIS — N186 End stage renal disease: Secondary | ICD-10-CM | POA: Diagnosis not present

## 2018-07-08 ENCOUNTER — Ambulatory Visit (INDEPENDENT_AMBULATORY_CARE_PROVIDER_SITE_OTHER): Payer: Medicare Other | Admitting: Internal Medicine

## 2018-07-08 ENCOUNTER — Other Ambulatory Visit: Payer: Self-pay

## 2018-07-08 ENCOUNTER — Encounter: Payer: Self-pay | Admitting: Internal Medicine

## 2018-07-08 DIAGNOSIS — F17210 Nicotine dependence, cigarettes, uncomplicated: Secondary | ICD-10-CM | POA: Diagnosis not present

## 2018-07-08 DIAGNOSIS — J4530 Mild persistent asthma, uncomplicated: Secondary | ICD-10-CM | POA: Diagnosis not present

## 2018-07-08 MED ORDER — BUDESONIDE-FORMOTEROL FUMARATE 160-4.5 MCG/ACT IN AERO: 2.0000 | INHALATION_SPRAY | Freq: Two times a day (BID) | RESPIRATORY_TRACT | 0 refills | Status: DC

## 2018-07-08 NOTE — Patient Instructions (Addendum)
Plan A = Automatic = Symbicort 160 Take 2 puffs first thing in am and then another 2 puffs about 12 hours later.   Work on inhaler technique:  relax and gently blow all the way out then take a nice smooth deep breath back in, triggering the inhaler at same time you start breathing in.  Hold for up to 5 seconds if you can. Blow symbicort out thru nose. Rinse and gargle with water when done   Plan B = Backup Only use your albuterol inhaler as a rescue medication to be used if you can't catch your breath by resting or doing a relaxed purse lip breathing pattern.  - The less you use it, the better it will work when you need it. - Ok to use the inhaler up to 2 puffs  every 4 hours if you must but call for appointment if use goes up over your usual need - Don't leave home without it !!  (think of it like the spare tire for your car)   The key is to stop smoking completely before smoking completely stops you! - For smoking cessation info/classes call [phone]      Please schedule a follow up visit in 3 months but call sooner if needed - bring inhalers

## 2018-07-08 NOTE — Progress Notes (Signed)
Subjective:   Patient ID: Kirk [name], male    DOB: 02/26/1960     MRN: [id number]    Brief patient profile:  3 yobm active smoker  referred 10/18/2014 to [place] for eval of ? Copd for clearance for lap chole  with  no airflow obst on spiromtery   10/18/2014 on HD since 2012    History of Present Illness  10/18/2014 1st [facility] office visit/ Jazmeen Axtell   Chief Complaint  Patient presents with   Advice Only    refer Dr. Nyoka Cowden. Denies any wheezing, no chest tx, no cough. Pt here to be evaluated for COPD.    variable sob with exertion - says can walk fine but no regular walking,  Some sob steps   rec Plan A = Backup=  symbiocrt 80 Take 2 puffs first thing in am and then another 2 puffs about 12 hours later.  Plan B = Back up Only use your combivent  as a rescue medication  Plan C = crisis Only use your nebulizer if you try B first and doesn't work You are cleared for surgery  > tol lap chole fine    Admit date: 04/29/2017 Discharge date: 05/02/2017  Admitted From: Home Disposition:  Home   Recommendations for Outpatient Follow-up:  1. Follow up with PCP in 1-2 weeks 2. Please obtain BMP/CBC in one week   Discharge Condition: Stable CODE STATUS:FULL Diet recommendation: Renal diet   Brief/Interim Summary: 58 year old male with end-stage renal disease on hemodialysis who presents with 48 hours of worsening malaise, associated with productive cough, headache, nausea and poor appetite. On his initial physical examination his temperature is 102.59F, heart rate 98, blood pressure 165/68, respiratory rate 26, oxygen saturation 91-92% on room air. Dry mucous membranes,mild pallor, lungs with bilateral rhonchi, heart S1-S2 present rhythmic, abdomen soft nontender, no lower extremity edema. Sodium 138, potassium 3.1, chloride 94, bicarb 29, glucose 136, BUN 22, creatinine 7.57, white count 5.5,hemoglobin 10.3, hematocrit 31.4, platelets 86.Chest x-ray with a small  patchy infiltrate at the right upper lobe.  Patient wasadmitted to the hospital with a working diagnosis of right upper lobe pneumonia,complicated by sepsis and hypokalemia.The patient was started on intravenous antibiotics. Nephrology was consulted to assist with dialysis.    Discharge Diagnoses:  Sepsis -due to pneumonia -continue ceftriaxone and azithromycin -pt has defervesced--no fever in the last 24 hours -Initially presented with fever of 102.5 F -Blood cultures remain negative -Personally reviewed chest x-ray--mild right upper lobe opacity -most recent temp 98.72F at 1240 on 05/01/17  Healthcare associated pneumonia -Defervesced on ceftriaxone and azithromycin -Check procalcitonin--1.99 -Influenza PCR--neg -Viral respiratory panel--positive coronavirus -home with cefdinir and azithromycin x 4 more days to finish 7 days of tx  Intractable headache -Likely toxic headache from the patient's sepsis -CT brain--neg for acute findings -Continue tramadol as needed severe headache -benadryl and compazine IV x 1 and IV solumedrol-->improved  ESRD -Last received dialysis for 05/01/2017 -Nephrology following -Metabolic bone disease per nephrology  Essential hypertension -Continue metoprolol tartrate and hydralazine -remains high  COPD Exacerbation -mild wheezing on exam-->improved with steroids -start IV solumedrol--home with prednisone x 3 more days -continue bronchodilators -started pulmicort while inpatient -stable on RA      07/11/2017 transition of care post hosp  f/u ov/Yanessa Hocevar re:   Active smoker /AB with restrictive spirometry ? Obesity related  Chief Complaint  Patient presents with   Hospitalization Follow-up    follow up for PNA in both lungs last month 05/30/17.  Per patient, still has a productive cough with clear phlegm. Breathing is getting better.   Dyspnea:  7/11 ok not HT  So not back to basline yet, also limited by fatigue, esp p HD  MWF Cough: since pna coughing noct and early am x up to a tbsp, white mucus assoc with nasal congestion Sleep: up 30 degrees  SABA use:  Both hfa and neb/ confused with when to use  rec Plan A = Automatic = symbicort 80 Take 2 puffs first thing in am and then another 2 puffs about 12 hours later.  Work on inhaler technique:  relax and gently blow all the way out then take a nice smooth deep breath back in, triggering the inhaler at same time you start breathing in.  Hold for up to 5 seconds if you can. Blow out thru nose. Rinse and gargle with water when done Plan B = Backup Only use your albuterol (ventolin)as a rescue medication Plan C = Crisis - only use your albuterol nebulizer if you first try Plan B and it fails to help > ok to use the nebulizer up to every 4 hours but if start needing it regularly call for immediate appointment The key is to stop smoking completely before smoking completely stops you!    09/30/2017  f/u ov/Sylus Stgermain re: asthma/ smoker/ f/u [facility] pna Chief Complaint  Patient presents with   Follow-up    Pt c/o increased cough with white sputum, SOB, nasal congestion, runny nose x 2 wks.   Dyspnea:  Walked at [employer] using rollator 2 d prior to OV   Cough: white esp hs  Sleeping: not needing saba no elevate  SABA use: more need over the last 2 weeks 2-3 days/ neb x 2  02: just at hd   rec Plan A = Automatic = symbicort increase to 160 Take 2 puffs first thing in am and then another 2 puffs about 12 hours later.  Work on [employer] fundraiser B = Backup Only use your albuterol (ventolin)  as a rescue medication Plan C = Crisis - only use your albuterol nebulizer if you first try Plan B and it fails to help > ok to use the nebulizer up to every 4 hours but if start needing it regularly call for immediate appointment The key is to stop smoking completely before smoking completely stops you!     12/30/2017  f/u ov/Darryon Bastin re: smoker with AB/ still smoking/  maint on symb 160 2bid Chief Complaint  Patient presents with   Follow-up    Increased cough and wheezing for the past 2-3 wks. He is coughing up some clear sputum. He is using his albuterol inhaler about 4 x per day and neb with albuterol about 2 x per wk.    Dyspnea:  MMRC3 = can't walk 100 yards even at a slow pace at a flat grade s stopping due to sob   Cough: worse than usual, feels like a chest cold/ mucoid production only  Sleeping: sitting upright like on a couch SABA use: baseline neb once a month, up to qid x sev weeks with onset of "chest  cold" , saba does help rec Stop robitussin and use maximum dose of mucinex dm up to 1200 mg every 12hours as needed and  Take tramadol 50 mg every 4 hours if needed  Prednisone 10 mg take  4 each am x 2 days,   2 each am x 2 days,  1 each  am x 2 days and stop  Prilosec should be Take 30- 60 min before your first and last meals of the day  if not improving need a cxr at the old building and office visit here asap     01/24/2018 NP Chest x ray showed fluid overload Patient is on dialysis - will need more fluid taken off Please follow up with dialysis and cardiology mucinex dm up to 1200 mg every 12hours as needed and  Take tramadol 50 mg every 4 hours if needed  Prilosec should be Take 30- 60 min before your first and last meals of the day   Admit date: 01/27/2018 Discharge date: 01/30/2018   Diet recommendation: Renal diet   Brief/Interim Summary:  Admission HPI written by Ivor Costa, MD   Chief Complaint:SOB, chest pain,rectal bleeding  LKG:MWNUU B Badgettis a 58 y.o.malewith medical history significant ofhypertension, hyperlipidemia, asthma, GERD, gout, anxiety, anemia,ESRD-HD (MWF),OSA on CPAP, e-cigarette smoking, 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 He also reports chills, no fever. He reports intermittent chest pain, which is located in the front chest, sometimes 10 out of 10 severity, currentlychest pain-free, no tenderness in the calf areas. Patient cannot characterize further if hischest pain is pleuritic or exertional. He reports headache in the frontal head. No unilateral weakness, numbness or tingling his extremities. No facial droop or slurred speech. Patient states that he has intermittent rectal bleeding with bright red-colored blood recently.He was seen by his GI, Dr. Ardis Hughs in 12/2017 and was told to takefiber supplements.He has nausea and vomiting sometimes, currently no abdominal pain or diarrhea. He denies symptoms of UTI. Pt states that he was seenby his pulmonologist on Friday, and hadanegative chest x-ray.  ED Course:pt was found to have positive troponin 0.03, positive d-dimer at 0.76, potassium 3.3, bicarbonate 29, creatinine 8.45, BUN 18, hemoglobin 7.8 (11.7 on 06/28/2017, 9.0 on 06/05/2017), BMP 51.2, temperature normal, no tachycardia, oxygen saturation 88-->100% on RA,chest x-ray with cardiomegaly and vascular congestion. Patient is placed on telemetry bed of observation.    Hospital course:  Acute diastolic heart failure Pulmonary vascular congestion on chest x-ray. Transthoracic Echocardiogram significant for a normal EF with grade 2 diastolic dysfunction. Patient reports symptoms consistent with orthopnea. Improved with HD. Counseled on proper diet and information given prior to discharge.  Asthma exacerbation Patient without a history of COPD, but rather asthma. Significant wheezing requiring IV steroids and breathing treatments. Patient's wheezing and exercise tolerance improved with treatment. Also given a course of azithromycin. Continue 5 day total course as an outpatient. Continue home inhalers.  Chest  pain CTA negative for PE. Associated mild troponin elevation. Cardiology consulted. And recommended no inpatient workup since Transthoracic Echocardiogram not significant for wall motion abnormalities or decreased EF.  ESRD on HD HD per nephrology  Tobacco abuse Patient counseled prior this hospitalization.  Hemorrhoids Diet recommendations made for patient. Patient sees outpatient GI for history of rectal bleeding with a recent colonoscopy significant for precancerous polyps. Hemoglobin stable.  Acute on chronic diastolic heart failure Resolved with HD. Follow-up outpatient with cardiology.  Discharge Diagnoses:  Principal Problem:   Acute respiratory failure with hypoxia (HCC) Active Problems:   GIB (gastrointestinal bleeding)   OSA on CPAP   HTN (hypertension)   Anemia in chronic renal disease  Pancytopenia ([place])   ESRD on dialysis [facility])   Chest pain   Hypokalemia   Cigarette smoker   Elevated troponin     03/31/2018  f/u ov/Alverto Shedd re: resumed smoking /HD MWF / cpap dr dohmeier  Chief Complaint  Patient presents with   Follow-up    Breathing is overall doing well. He has not had to use the albuterol since the last visit.    Dyspnea:  MMRC3 = can't walk 100 yards even at a slow pace at a flat grade s stopping due to sob  (not able to reproduce this in office ) Cough: worse p supper / dry Sleeping: on cough ok on cpap  SABA use:  None  02: none  rec Work on inhaler technique:   Change symbicort to 80 Take 2 puffs first thing in am and then another 2 puffs about 12 hours later.  Only use your albuterol as a rescue medication   Please schedule a follow up visit in 3 months but call sooner if needed  with all medications /inhalers/ solutions in hand so we can verify exactly what you are taking. This includes all medications from all doctors and over the counters    07/08/2018  f/u ov/Sharnise Blough re: AB still smoking  Chief Complaint  Patient presents with   Follow-up     No complaints today.   Dyspnea:  MMRC1 = can walk nl pace, flat grade, can't hurry or go uphills or steps s sob   Cough: hs / not in am / min mucoid  Sleeping: on side /2 pillow/cpap  SABA use: no rescue inhalers 02: none  Very confused with details of care   No obvious day to day or daytime variability or assoc excess/ purulent sputum or mucus plugs or hemoptysis or cp or chest tightness, subjective wheeze or overt sinus or hb symptoms.   Sleeping as above without nocturnal  or early am exacerbation  of respiratory  c/o's or need for noct saba. Also denies any obvious fluctuation of symptoms with weather or environmental changes or other aggravating or alleviating factors except as outlined above   No unusual exposure hx or h/o childhood pna/ asthma or knowledge of premature birth.  Current Allergies, Complete Past Medical History, Past Surgical History, Family History, and Social History were reviewed in [employer] record.  ROS  The following are not active complaints unless bolded Hoarseness, sore throat, dysphagia, dental problems, itching, sneezing,  nasal congestion or discharge of excess mucus or purulent secretions, ear ache,   fever, chills, sweats, unintended wt loss or wt gain, classically pleuritic or exertional cp,  orthopnea pnd or arm/hand swelling  or leg swelling, presyncope, palpitations, abdominal pain, anorexia, nausea, vomiting, diarrhea  or change in bowel habits or change in bladder habits, change in stools or change in urine, dysuria, hematuria,  rash, arthralgias, visual complaints, headache, numbness, weakness or ataxia or problems with walking or coordination,  change in mood or  memory.        Current Meds  Medication Sig   acetaminophen (TYLENOL) 500 MG tablet Take 1 tablet (500 mg total) by mouth every 6 (six) hours as needed for moderate pain.   albuterol (PROVENTIL HFA;VENTOLIN HFA) 108 (90 Base) MCG/ACT inhaler Inhale 2 puffs into  the lungs every 6 (six) hours as needed for wheezing or shortness of breath.   aspirin EC 81 MG tablet Take 81 mg by mouth daily.   b complex-vitamin c-folic acid (NEPHRO-VITE) 0.8 MG TABS  tablet TAKE 1 TABLET BY MOUTH EVERY DAY (Patient taking differently: Take 1 tablet by mouth daily. )   budesonide-formoterol (SYMBICORT) 160-4.5 MCG/ACT inhaler Take 2 puffs first thing in am and then another 2 puffs about 12 hours later. (Patient taking differently: Inhale 2 puffs into the lungs every 12 (twelve) hours. )   calcitRIOL (ROCALTROL) 0.5 MCG capsule Take 2 capsules (1 mcg total) by mouth every Monday, Wednesday, and Friday with hemodialysis.   Cholecalciferol (VITAMIN D3) 5000 UNITS TABS Take 5,000 Units by mouth daily.    clonazePAM (KLONOPIN) 0.5 MG tablet TAKE 1 TABLET (0.5 MG TOTAL) BY MOUTH 2 (TWO) TIMES DAILY AS NEEDED FOR ANXIETY.   diphenhydrAMINE (BENADRYL) 25 MG tablet Take 25 mg by mouth 2 (two) times daily as needed for allergies.    guaiFENesin (MUCINEX) 600 MG 12 hr tablet Take 1,200 mg by mouth 2 (two) times daily.   guaiFENesin-dextromethorphan (ROBITUSSIN DM) 100-10 MG/5ML syrup Take 10 mLs by mouth every 4 (four) hours as needed for cough.   hydrALAZINE (APRESOLINE) 25 MG tablet Take 1 tablet (25 mg total) by mouth every 8 (eight) hours.   metoprolol tartrate 75 MG TABS Take 75 mg by mouth 2 (two) times daily.   nicotine (NICODERM CQ - DOSED IN MG/24 HOURS) 21 mg/24hr patch Place 1 patch (21 mg total) onto the skin daily.   omeprazole (PRILOSEC) 20 MG capsule TAKE 1 CAPSULE TWICE DAILY BEFORE A MEAL (Patient taking differently: Take 20 mg by mouth 2 (two) times daily before a meal. )   ondansetron (ZOFRAN ODT) 4 MG disintegrating tablet Take 1 tablet (4 mg total) by mouth every 8 (eight) hours as needed for nausea or vomiting.   pravastatin (PRAVACHOL) 40 MG tablet TAKE 1 TABLET EVERY EVENING (Patient taking differently: Take 40 mg by mouth at bedtime. )   predniSONE  (DELTASONE) 10 MG tablet Take 6 tabs for 2 days starting 5/13 then take 4 tabs for 2 days then take 2 tabs for 2 days then take 1 tab for 2 days then stop.   PRESCRIPTION MEDICATION Inhale into the lungs at bedtime. CPAP   sucroferric oxyhydroxide (VELPHORO) 500 MG chewable tablet Chew 500-1,500 mg by mouth See admin instructions. Chew 3 tablets (1500 mg) by mouth up to three times daily with meals and 1 tablet (500 mg) with snacks   traMADol (ULTRAM) 50 MG tablet Take 1 tablet (50 mg total) by mouth daily as needed. (Patient taking differently: Take 50 mg by mouth daily as needed (pain). )               Objective:   Physical Exam  Somber amb bm nad   07/08/2018       222 03/31/2018         229  12/30/2017       222  09/30/2017         222  07/11/2017       217   10/18/14 233 lb 12.8 oz (106.051 kg)  10/05/14 235 lb 3.2 oz (106.686 kg)  09/20/14 230 lb 6.1 oz (104.5 kg)    Vital signs reviewed - Note on arrival 02 sats  99% on RA     HEENT: nl   turbinates bilaterally, and oropharynx. Nl external ear canals without cough reflex   NECK :  without JVD/Nodes/TM/ nl carotid upstrokes bilaterally   LUNGS: no acc muscle use,  Nl contour chest which is clear to A and P bilaterally without cough on insp  or exp maneuvers   CV:  RRR  no s3 or murmur or increase in P2, and no edema   ABD:  soft and nontender with nl inspiratory excursion in the supine position. No bruits or organomegaly appreciated, bowel sounds nl  MS:  Nl gait/ ext warm without deformities, calf tenderness, cyanosis or clubbing No obvious joint restrictions   SKIN: warm and dry without lesions    NEURO:  alert, approp, nl sensorium with  no motor or cerebellar deficits apparent.          I personally reviewed images and agree with radiology impression as follows:  CXR:   06/01/2018 Cardiomegaly without acute abnormality of the lungs.           Assessment & Plan:

## 2018-07-09 ENCOUNTER — Encounter: Payer: Self-pay | Admitting: Internal Medicine

## 2018-07-09 DIAGNOSIS — D509 Iron deficiency anemia, unspecified: Secondary | ICD-10-CM | POA: Diagnosis not present

## 2018-07-09 DIAGNOSIS — N186 End stage renal disease: Secondary | ICD-10-CM | POA: Diagnosis not present

## 2018-07-09 DIAGNOSIS — N2581 Secondary hyperparathyroidism of renal origin: Secondary | ICD-10-CM | POA: Diagnosis not present

## 2018-07-09 DIAGNOSIS — D631 Anemia in chronic kidney disease: Secondary | ICD-10-CM | POA: Diagnosis not present

## 2018-07-09 NOTE — Assessment & Plan Note (Signed)
Counseled re importance of smoking cessation but did not meet time criteria for separate billing   °

## 2018-07-09 NOTE — Assessment & Plan Note (Signed)
Active smoker - Spirometry 07/11/2017  Restrictive/ min curvature on no rx - 07/11/2017   Try symb 80 2bid - 09/30/2017     try symbicort 160 2bid as has rhonchi on exam, still smoking  - Spirometry 03/31/2018  FEV1 1.4 (41%)  Ratio 0.72 min curvature p symb 160 x 2 in am with poor baseline hfa -  07/08/2018  After extensive coaching inhaler device,  effectiveness =    90%   Despite active smoking and poor insight into meds, All goals of chronic asthma control met including optimal function and elimination of symptoms with minimal need for rescue therapy.  Contingencies discussed in full including contacting this office immediately if not controlling the symptoms using the rule of two's.      I had an extended discussion with the patient reviewing all relevant studies completed to date and  lasting 15 to 20 minutes of a 25 minute visit    See device teaching which extended face to face time for this visit.  Each maintenance medication was reviewed in detail including emphasizing most importantly the difference between maintenance and prns and under what circumstances the prns are to be triggered using an action plan format that is not reflected in the computer generated alphabetically organized AVS which I have not found useful in most complex patients, especially with respiratory illnesses  Please see AVS for specific instructions unique to this visit that I personally wrote and verbalized to the the pt in detail and then reviewed with pt  by my nurse highlighting any  changes in therapy recommended at today's visit to their plan of care.

## 2018-07-11 DIAGNOSIS — D509 Iron deficiency anemia, unspecified: Secondary | ICD-10-CM | POA: Diagnosis not present

## 2018-07-11 DIAGNOSIS — N186 End stage renal disease: Secondary | ICD-10-CM | POA: Diagnosis not present

## 2018-07-11 DIAGNOSIS — D631 Anemia in chronic kidney disease: Secondary | ICD-10-CM | POA: Diagnosis not present

## 2018-07-11 DIAGNOSIS — N2581 Secondary hyperparathyroidism of renal origin: Secondary | ICD-10-CM | POA: Diagnosis not present

## 2018-07-14 DIAGNOSIS — N2581 Secondary hyperparathyroidism of renal origin: Secondary | ICD-10-CM | POA: Diagnosis not present

## 2018-07-14 DIAGNOSIS — D631 Anemia in chronic kidney disease: Secondary | ICD-10-CM | POA: Diagnosis not present

## 2018-07-14 DIAGNOSIS — N186 End stage renal disease: Secondary | ICD-10-CM | POA: Diagnosis not present

## 2018-07-14 DIAGNOSIS — D509 Iron deficiency anemia, unspecified: Secondary | ICD-10-CM | POA: Diagnosis not present

## 2018-07-16 DIAGNOSIS — D509 Iron deficiency anemia, unspecified: Secondary | ICD-10-CM | POA: Diagnosis not present

## 2018-07-16 DIAGNOSIS — D631 Anemia in chronic kidney disease: Secondary | ICD-10-CM | POA: Diagnosis not present

## 2018-07-16 DIAGNOSIS — N2581 Secondary hyperparathyroidism of renal origin: Secondary | ICD-10-CM | POA: Diagnosis not present

## 2018-07-16 DIAGNOSIS — N186 End stage renal disease: Secondary | ICD-10-CM | POA: Diagnosis not present

## 2018-07-18 DIAGNOSIS — N2581 Secondary hyperparathyroidism of renal origin: Secondary | ICD-10-CM | POA: Diagnosis not present

## 2018-07-18 DIAGNOSIS — N186 End stage renal disease: Secondary | ICD-10-CM | POA: Diagnosis not present

## 2018-07-18 DIAGNOSIS — D631 Anemia in chronic kidney disease: Secondary | ICD-10-CM | POA: Diagnosis not present

## 2018-07-18 DIAGNOSIS — D509 Iron deficiency anemia, unspecified: Secondary | ICD-10-CM | POA: Diagnosis not present

## 2018-07-21 DIAGNOSIS — D631 Anemia in chronic kidney disease: Secondary | ICD-10-CM | POA: Diagnosis not present

## 2018-07-21 DIAGNOSIS — N2581 Secondary hyperparathyroidism of renal origin: Secondary | ICD-10-CM | POA: Diagnosis not present

## 2018-07-21 DIAGNOSIS — N186 End stage renal disease: Secondary | ICD-10-CM | POA: Diagnosis not present

## 2018-07-21 DIAGNOSIS — D509 Iron deficiency anemia, unspecified: Secondary | ICD-10-CM | POA: Diagnosis not present

## 2018-07-23 DIAGNOSIS — N186 End stage renal disease: Secondary | ICD-10-CM | POA: Diagnosis not present

## 2018-07-23 DIAGNOSIS — N2581 Secondary hyperparathyroidism of renal origin: Secondary | ICD-10-CM | POA: Diagnosis not present

## 2018-07-23 DIAGNOSIS — Z992 Dependence on renal dialysis: Secondary | ICD-10-CM | POA: Diagnosis not present

## 2018-07-23 DIAGNOSIS — D509 Iron deficiency anemia, unspecified: Secondary | ICD-10-CM | POA: Diagnosis not present

## 2018-07-23 DIAGNOSIS — T8612 Kidney transplant failure: Secondary | ICD-10-CM | POA: Diagnosis not present

## 2018-07-25 DIAGNOSIS — N2581 Secondary hyperparathyroidism of renal origin: Secondary | ICD-10-CM | POA: Diagnosis not present

## 2018-07-25 DIAGNOSIS — D509 Iron deficiency anemia, unspecified: Secondary | ICD-10-CM | POA: Diagnosis not present

## 2018-07-25 DIAGNOSIS — N186 End stage renal disease: Secondary | ICD-10-CM | POA: Diagnosis not present

## 2018-07-28 DIAGNOSIS — N2581 Secondary hyperparathyroidism of renal origin: Secondary | ICD-10-CM | POA: Diagnosis not present

## 2018-07-28 DIAGNOSIS — N186 End stage renal disease: Secondary | ICD-10-CM | POA: Diagnosis not present

## 2018-07-28 DIAGNOSIS — D509 Iron deficiency anemia, unspecified: Secondary | ICD-10-CM | POA: Diagnosis not present

## 2018-07-30 DIAGNOSIS — N2581 Secondary hyperparathyroidism of renal origin: Secondary | ICD-10-CM | POA: Diagnosis not present

## 2018-07-30 DIAGNOSIS — D509 Iron deficiency anemia, unspecified: Secondary | ICD-10-CM | POA: Diagnosis not present

## 2018-07-30 DIAGNOSIS — N186 End stage renal disease: Secondary | ICD-10-CM | POA: Diagnosis not present

## 2018-08-01 DIAGNOSIS — D509 Iron deficiency anemia, unspecified: Secondary | ICD-10-CM | POA: Diagnosis not present

## 2018-08-01 DIAGNOSIS — N186 End stage renal disease: Secondary | ICD-10-CM | POA: Diagnosis not present

## 2018-08-01 DIAGNOSIS — N2581 Secondary hyperparathyroidism of renal origin: Secondary | ICD-10-CM | POA: Diagnosis not present

## 2018-08-04 DIAGNOSIS — N186 End stage renal disease: Secondary | ICD-10-CM | POA: Diagnosis not present

## 2018-08-04 DIAGNOSIS — N2581 Secondary hyperparathyroidism of renal origin: Secondary | ICD-10-CM | POA: Diagnosis not present

## 2018-08-04 DIAGNOSIS — D509 Iron deficiency anemia, unspecified: Secondary | ICD-10-CM | POA: Diagnosis not present

## 2018-08-06 DIAGNOSIS — D509 Iron deficiency anemia, unspecified: Secondary | ICD-10-CM | POA: Diagnosis not present

## 2018-08-06 DIAGNOSIS — N2581 Secondary hyperparathyroidism of renal origin: Secondary | ICD-10-CM | POA: Diagnosis not present

## 2018-08-06 DIAGNOSIS — N186 End stage renal disease: Secondary | ICD-10-CM | POA: Diagnosis not present

## 2018-08-08 DIAGNOSIS — N186 End stage renal disease: Secondary | ICD-10-CM | POA: Diagnosis not present

## 2018-08-08 DIAGNOSIS — N2581 Secondary hyperparathyroidism of renal origin: Secondary | ICD-10-CM | POA: Diagnosis not present

## 2018-08-12 ENCOUNTER — Other Ambulatory Visit: Payer: Self-pay

## 2018-08-12 ENCOUNTER — Inpatient Hospital Stay (HOSPITAL_COMMUNITY)
Admission: EM | Admit: 2018-08-12 | Discharge: 2018-08-14 | DRG: 291 | Disposition: A | Discharge status: Home / Self Care | Payer: Medicare Other | Attending: Internal Medicine | Admitting: Internal Medicine

## 2018-08-12 ENCOUNTER — Encounter (HOSPITAL_COMMUNITY): Payer: Self-pay | Admitting: Emergency Medicine

## 2018-08-12 ENCOUNTER — Emergency Department (HOSPITAL_COMMUNITY): Payer: Medicare Other

## 2018-08-12 DIAGNOSIS — Z20828 Contact with and (suspected) exposure to other viral communicable diseases: Secondary | ICD-10-CM | POA: Diagnosis present

## 2018-08-12 DIAGNOSIS — R51 Headache: Secondary | ICD-10-CM | POA: Diagnosis not present

## 2018-08-12 DIAGNOSIS — G92 Toxic encephalopathy: Secondary | ICD-10-CM | POA: Diagnosis not present

## 2018-08-12 DIAGNOSIS — R0603 Acute respiratory distress: Secondary | ICD-10-CM | POA: Diagnosis not present

## 2018-08-12 DIAGNOSIS — Z992 Dependence on renal dialysis: Secondary | ICD-10-CM | POA: Diagnosis not present

## 2018-08-12 DIAGNOSIS — R0600 Dyspnea, unspecified: Secondary | ICD-10-CM | POA: Diagnosis not present

## 2018-08-12 DIAGNOSIS — N186 End stage renal disease: Secondary | ICD-10-CM | POA: Diagnosis not present

## 2018-08-12 DIAGNOSIS — N400 Enlarged prostate without lower urinary tract symptoms: Secondary | ICD-10-CM | POA: Diagnosis present

## 2018-08-12 DIAGNOSIS — D696 Thrombocytopenia, unspecified: Secondary | ICD-10-CM | POA: Diagnosis not present

## 2018-08-12 DIAGNOSIS — E559 Vitamin D deficiency, unspecified: Secondary | ICD-10-CM | POA: Diagnosis present

## 2018-08-12 DIAGNOSIS — I5033 Acute on chronic diastolic (congestive) heart failure: Secondary | ICD-10-CM | POA: Diagnosis present

## 2018-08-12 DIAGNOSIS — I132 Hypertensive heart and chronic kidney disease with heart failure and with stage 5 chronic kidney disease, or end stage renal disease: Secondary | ICD-10-CM | POA: Diagnosis not present

## 2018-08-12 DIAGNOSIS — N189 Chronic kidney disease, unspecified: Secondary | ICD-10-CM | POA: Diagnosis present

## 2018-08-12 DIAGNOSIS — Z79899 Other long term (current) drug therapy: Secondary | ICD-10-CM

## 2018-08-12 DIAGNOSIS — G928 Other toxic encephalopathy: Secondary | ICD-10-CM | POA: Diagnosis present

## 2018-08-12 DIAGNOSIS — Z940 Kidney transplant status: Secondary | ICD-10-CM

## 2018-08-12 DIAGNOSIS — R7989 Other specified abnormal findings of blood chemistry: Secondary | ICD-10-CM

## 2018-08-12 DIAGNOSIS — G9340 Encephalopathy, unspecified: Secondary | ICD-10-CM

## 2018-08-12 DIAGNOSIS — R778 Other specified abnormalities of plasma proteins: Secondary | ICD-10-CM | POA: Diagnosis present

## 2018-08-12 DIAGNOSIS — J189 Pneumonia, unspecified organism: Secondary | ICD-10-CM | POA: Diagnosis present

## 2018-08-12 DIAGNOSIS — Z7951 Long term (current) use of inhaled steroids: Secondary | ICD-10-CM

## 2018-08-12 DIAGNOSIS — J9601 Acute respiratory failure with hypoxia: Secondary | ICD-10-CM | POA: Diagnosis present

## 2018-08-12 DIAGNOSIS — I160 Hypertensive urgency: Secondary | ICD-10-CM | POA: Diagnosis present

## 2018-08-12 DIAGNOSIS — R0602 Shortness of breath: Secondary | ICD-10-CM | POA: Diagnosis not present

## 2018-08-12 DIAGNOSIS — Z905 Acquired absence of kidney: Secondary | ICD-10-CM

## 2018-08-12 DIAGNOSIS — R55 Syncope and collapse: Secondary | ICD-10-CM | POA: Diagnosis not present

## 2018-08-12 DIAGNOSIS — N2581 Secondary hyperparathyroidism of renal origin: Secondary | ICD-10-CM | POA: Diagnosis not present

## 2018-08-12 DIAGNOSIS — K219 Gastro-esophageal reflux disease without esophagitis: Secondary | ICD-10-CM | POA: Diagnosis present

## 2018-08-12 DIAGNOSIS — J810 Acute pulmonary edema: Secondary | ICD-10-CM

## 2018-08-12 DIAGNOSIS — R52 Pain, unspecified: Secondary | ICD-10-CM | POA: Diagnosis not present

## 2018-08-12 DIAGNOSIS — R509 Fever, unspecified: Secondary | ICD-10-CM

## 2018-08-12 DIAGNOSIS — J45909 Unspecified asthma, uncomplicated: Secondary | ICD-10-CM | POA: Diagnosis present

## 2018-08-12 DIAGNOSIS — I120 Hypertensive chronic kidney disease with stage 5 chronic kidney disease or end stage renal disease: Secondary | ICD-10-CM | POA: Diagnosis not present

## 2018-08-12 DIAGNOSIS — D631 Anemia in chronic kidney disease: Secondary | ICD-10-CM

## 2018-08-12 DIAGNOSIS — Z7982 Long term (current) use of aspirin: Secondary | ICD-10-CM

## 2018-08-12 DIAGNOSIS — G4733 Obstructive sleep apnea (adult) (pediatric): Secondary | ICD-10-CM

## 2018-08-12 DIAGNOSIS — F419 Anxiety disorder, unspecified: Secondary | ICD-10-CM | POA: Diagnosis present

## 2018-08-12 DIAGNOSIS — R4182 Altered mental status, unspecified: Secondary | ICD-10-CM

## 2018-08-12 DIAGNOSIS — I10 Essential (primary) hypertension: Secondary | ICD-10-CM

## 2018-08-12 DIAGNOSIS — I5032 Chronic diastolic (congestive) heart failure: Secondary | ICD-10-CM | POA: Diagnosis present

## 2018-08-12 DIAGNOSIS — Z209 Contact with and (suspected) exposure to unspecified communicable disease: Secondary | ICD-10-CM | POA: Diagnosis not present

## 2018-08-12 DIAGNOSIS — D61818 Other pancytopenia: Secondary | ICD-10-CM | POA: Diagnosis not present

## 2018-08-12 DIAGNOSIS — F17210 Nicotine dependence, cigarettes, uncomplicated: Secondary | ICD-10-CM | POA: Diagnosis present

## 2018-08-12 DIAGNOSIS — E785 Hyperlipidemia, unspecified: Secondary | ICD-10-CM | POA: Diagnosis present

## 2018-08-12 LAB — CBC
HCT: 25.4 % — ABNORMAL LOW (ref 39.0–52.0)
Hemoglobin: 8.2 g/dL — ABNORMAL LOW (ref 13.0–17.0)
MCH: 31.7 pg (ref 26.0–34.0)
MCHC: 32.3 g/dL (ref 30.0–36.0)
MCV: 98.1 fL (ref 80.0–100.0)
Platelets: 80 10*3/uL — ABNORMAL LOW (ref 150–400)
RBC: 2.59 MIL/uL — ABNORMAL LOW (ref 4.22–5.81)
RDW: 14.9 % (ref 11.5–15.5)
WBC: 7.2 10*3/uL (ref 4.0–10.5)
nRBC: 0 % (ref 0.0–0.2)

## 2018-08-12 LAB — BASIC METABOLIC PANEL
Anion gap: 19 — ABNORMAL HIGH (ref 5–15)
BUN: 67 mg/dL — ABNORMAL HIGH (ref 6–20)
CO2: 21 mmol/L — ABNORMAL LOW (ref 22–32)
Calcium: 9.1 mg/dL (ref 8.9–10.3)
Chloride: 100 mmol/L (ref 98–111)
Creatinine, Ser: 19.59 mg/dL — ABNORMAL HIGH (ref 0.61–1.24)
GFR calc Af Amer: 3 mL/min — ABNORMAL LOW (ref >60)
GFR calc non Af Amer: 2 mL/min — ABNORMAL LOW (ref >60)
Glucose, Bld: 143 mg/dL — ABNORMAL HIGH (ref 70–99)
Potassium: 4.3 mmol/L (ref 3.5–5.1)
Sodium: 140 mmol/L (ref 135–145)

## 2018-08-12 LAB — POCT I-STAT 7, (LYTES, BLD GAS, ICA,H+H)
Acid-base deficit: 4 mmol/L — ABNORMAL HIGH (ref 0.0–2.0)
Bicarbonate: 20.8 mmol/L (ref 20.0–28.0)
Calcium, Ion: 1.12 mmol/L — ABNORMAL LOW (ref 1.15–1.40)
HCT: 38 % — ABNORMAL LOW (ref 39.0–52.0)
Hemoglobin: 12.9 g/dL — ABNORMAL LOW (ref 13.0–17.0)
O2 Saturation: 91 %
Patient temperature: 98.6
Potassium: 4.1 mmol/L (ref 3.5–5.1)
Sodium: 139 mmol/L (ref 135–145)
TCO2: 22 mmol/L (ref 22–32)
pCO2 arterial: 34.7 mmHg (ref 32.0–48.0)
pH, Arterial: 7.385 (ref 7.350–7.450)
pO2, Arterial: 61 mmHg — ABNORMAL LOW (ref 83.0–108.0)

## 2018-08-12 LAB — HEPATIC FUNCTION PANEL
ALT: 35 U/L (ref 0–44)
AST: 27 U/L (ref 15–41)
Albumin: 3.6 g/dL (ref 3.5–5.0)
Alkaline Phosphatase: 87 U/L (ref 38–126)
Bilirubin, Direct: 0.1 mg/dL (ref 0.0–0.2)
Indirect Bilirubin: 0.7 mg/dL (ref 0.3–0.9)
Total Bilirubin: 0.8 mg/dL (ref 0.3–1.2)
Total Protein: 6.8 g/dL (ref 6.5–8.1)

## 2018-08-12 LAB — LACTIC ACID, PLASMA
Lactic Acid, Venous: 1 mmol/L (ref 0.5–1.9)
Lactic Acid, Venous: 1.1 mmol/L (ref 0.5–1.9)

## 2018-08-12 LAB — CBG MONITORING, ED: Glucose-Capillary: 124 mg/dL — ABNORMAL HIGH (ref 70–99)

## 2018-08-12 LAB — TROPONIN I (HIGH SENSITIVITY)
Troponin I (High Sensitivity): 75 ng/L — ABNORMAL HIGH (ref <18)
Troponin I (High Sensitivity): 69 ng/L — ABNORMAL HIGH (ref <18)

## 2018-08-12 LAB — AMMONIA: Ammonia: 34 umol/L (ref 9–35)

## 2018-08-12 LAB — SARS CORONAVIRUS 2 BY RT PCR (HOSPITAL ORDER, PERFORMED IN ~~LOC~~ HOSPITAL LAB): SARS Coronavirus 2: NEGATIVE

## 2018-08-12 MED ORDER — SUCROFERRIC OXYHYDROXIDE 500 MG PO CHEW
500.0000 mg | CHEWABLE_TABLET | ORAL | Status: DC
  Administered 2018-08-12 – 2018-08-13 (×2): 500 mg via ORAL
  Filled 2018-08-12 (×3): qty 1

## 2018-08-12 MED ORDER — MOMETASONE FURO-FORMOTEROL FUM 200-5 MCG/ACT IN AERO
2.0000 | INHALATION_SPRAY | Freq: Two times a day (BID) | RESPIRATORY_TRACT | Status: DC
  Administered 2018-08-12 – 2018-08-14 (×4): 2 via RESPIRATORY_TRACT
  Filled 2018-08-12: qty 8.8

## 2018-08-12 MED ORDER — SUCROFERRIC OXYHYDROXIDE 500 MG PO CHEW
1500.0000 mg | CHEWABLE_TABLET | Freq: Three times a day (TID) | ORAL | Status: DC
  Administered 2018-08-13 – 2018-08-14 (×2): 1500 mg via ORAL
  Filled 2018-08-12 (×6): qty 3

## 2018-08-12 MED ORDER — PRAVASTATIN SODIUM 40 MG PO TABS
40.0000 mg | ORAL_TABLET | Freq: Every day | ORAL | Status: DC
  Administered 2018-08-12 – 2018-08-13 (×2): 40 mg via ORAL
  Filled 2018-08-12 (×2): qty 1

## 2018-08-12 MED ORDER — METOPROLOL TARTRATE 25 MG PO TABS
75.0000 mg | ORAL_TABLET | Freq: Two times a day (BID) | ORAL | Status: DC
  Administered 2018-08-12 – 2018-08-13 (×4): 75 mg via ORAL
  Filled 2018-08-12 (×4): qty 3

## 2018-08-12 MED ORDER — ASPIRIN EC 81 MG PO TBEC
81.0000 mg | DELAYED_RELEASE_TABLET | Freq: Every day | ORAL | Status: DC
  Administered 2018-08-12 – 2018-08-14 (×3): 81 mg via ORAL
  Filled 2018-08-12 (×3): qty 1

## 2018-08-12 MED ORDER — ACETAMINOPHEN 650 MG RE SUPP: 650.0000 mg | Freq: Four times a day (QID) | RECTAL | Status: DC | PRN

## 2018-08-12 MED ORDER — ALBUTEROL SULFATE (2.5 MG/3ML) 0.083% IN NEBU: 2.5000 mg | INHALATION_SOLUTION | RESPIRATORY_TRACT | Status: DC | PRN

## 2018-08-12 MED ORDER — HYDRALAZINE HCL 25 MG PO TABS
25.0000 mg | ORAL_TABLET | Freq: Four times a day (QID) | ORAL | Status: DC
  Administered 2018-08-12 – 2018-08-14 (×3): 25 mg via ORAL
  Filled 2018-08-12 (×5): qty 1

## 2018-08-12 MED ORDER — HEPARIN SODIUM (PORCINE) 1000 UNIT/ML IJ SOLN
INTRAMUSCULAR | Status: AC
  Filled 2018-08-12: qty 4

## 2018-08-12 MED ORDER — CLONAZEPAM 0.5 MG PO TABS: 0.5000 mg | ORAL_TABLET | Freq: Two times a day (BID) | ORAL | Status: DC | PRN

## 2018-08-12 MED ORDER — HYDRALAZINE HCL 25 MG PO TABS
100.0000 mg | ORAL_TABLET | Freq: Two times a day (BID) | ORAL | Status: DC
  Administered 2018-08-12 – 2018-08-13 (×4): 100 mg via ORAL
  Filled 2018-08-12 (×7): qty 4

## 2018-08-12 MED ORDER — HYDRALAZINE HCL 25 MG PO TABS: 25.0000 mg | ORAL_TABLET | Freq: Four times a day (QID) | ORAL | Status: DC

## 2018-08-12 MED ORDER — PANTOPRAZOLE SODIUM 40 MG PO TBEC
40.0000 mg | DELAYED_RELEASE_TABLET | Freq: Every day | ORAL | Status: DC
  Administered 2018-08-12 – 2018-08-14 (×3): 40 mg via ORAL
  Filled 2018-08-12 (×3): qty 1

## 2018-08-12 MED ORDER — HYDRALAZINE HCL 20 MG/ML IJ SOLN
10.0000 mg | INTRAMUSCULAR | Status: DC | PRN
  Administered 2018-08-12: 10 mg via INTRAVENOUS
  Filled 2018-08-12: qty 1

## 2018-08-12 MED ORDER — ALBUTEROL SULFATE (2.5 MG/3ML) 0.083% IN NEBU: 3.0000 mL | INHALATION_SOLUTION | RESPIRATORY_TRACT | Status: DC | PRN

## 2018-08-12 MED ORDER — ALBUTEROL SULFATE HFA 108 (90 BASE) MCG/ACT IN AERS
8.0000 | INHALATION_SPRAY | Freq: Once | RESPIRATORY_TRACT | Status: AC
  Administered 2018-08-12: 8 via RESPIRATORY_TRACT
  Filled 2018-08-12: qty 6.7

## 2018-08-12 MED ORDER — ACETAMINOPHEN 325 MG PO TABS
650.0000 mg | ORAL_TABLET | Freq: Four times a day (QID) | ORAL | Status: DC | PRN
  Administered 2018-08-12 – 2018-08-13 (×3): 650 mg via ORAL
  Filled 2018-08-12 (×3): qty 2

## 2018-08-12 MED ORDER — GUAIFENESIN-DM 100-10 MG/5ML PO SYRP
5.0000 mL | ORAL_SOLUTION | ORAL | Status: DC | PRN
  Administered 2018-08-12 – 2018-08-13 (×2): 5 mL via ORAL
  Filled 2018-08-12 (×2): qty 5

## 2018-08-12 MED ORDER — CALCITRIOL 0.25 MCG PO CAPS
1.0000 ug | ORAL_CAPSULE | ORAL | Status: DC
  Filled 2018-08-12: qty 4

## 2018-08-12 MED ORDER — GUAIFENESIN ER 600 MG PO TB12
1200.0000 mg | ORAL_TABLET | Freq: Two times a day (BID) | ORAL | Status: DC
  Administered 2018-08-12 – 2018-08-14 (×5): 1200 mg via ORAL
  Filled 2018-08-12 (×5): qty 2

## 2018-08-12 MED ORDER — CHLORHEXIDINE GLUCONATE CLOTH 2 % EX PADS
6.0000 | MEDICATED_PAD | Freq: Every day | CUTANEOUS | Status: DC
  Administered 2018-08-13 – 2018-08-14 (×2): 6 via TOPICAL

## 2018-08-12 MED ORDER — TRAMADOL HCL 50 MG PO TABS
50.0000 mg | ORAL_TABLET | Freq: Every day | ORAL | Status: DC | PRN
  Administered 2018-08-12: 50 mg via ORAL
  Filled 2018-08-12 (×2): qty 1

## 2018-08-12 MED ORDER — CHLORHEXIDINE GLUCONATE CLOTH 2 % EX PADS
6.0000 | MEDICATED_PAD | Freq: Every day | CUTANEOUS | Status: DC
  Administered 2018-08-13: 6 via TOPICAL

## 2018-08-12 MED ORDER — SODIUM CHLORIDE 0.9% FLUSH
3.0000 mL | Freq: Two times a day (BID) | INTRAVENOUS | Status: DC
  Administered 2018-08-12 – 2018-08-14 (×5): 3 mL via INTRAVENOUS

## 2018-08-12 NOTE — ED Provider Notes (Signed)
[facility] EMERGENCY DEPARTMENT Provider Note   CSN: [id number] Arrival date & time: 08/12/18  0545     History   Chief Complaint Chief Complaint  Patient presents with  . Shortness of Breath  . Headache  . Loss of Consciousness    HPI Kirk [name] is a 58 y.o. male.     Level 5 caveat for altered mental status.  Patient brought in by EMS after trying to walk to dialysis this morning and becoming lightheaded and short of breath and having a headache.  He may have lost consciousness.  Patient states he normally goes to dialysis Monday, Wednesday and Friday.  He did not go yesterday because his car broke down.  His last dialysis session was Friday July 18.  He states he began to feel short of breath and dizzy and lightheaded while trying to walk to dialysis and had to lie down at a restaurant.  He is not certain if he lost consciousness.  Reports a gradual onset headache but does not think that he hit his head.  He denies any chest pain, abdominal pain, vomiting or diarrhea.  No fevers.  No focal weakness, numbness or tingling.  EMS reports that he was confused but is still oriented to person and place.  The history is provided by the patient.  Shortness of Breath Associated symptoms: headaches and syncope   Headache Associated symptoms: syncope   Loss of Consciousness Associated symptoms: headaches and shortness of breath     Past Medical History:  Diagnosis Date  . Acute edema of lung, unspecified   . Acute, but ill-defined, cerebrovascular disease   . Allergy   . Anemia   . 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)   . Anxiety   . Asthma   . Asthma    moderate persistent  . Carpal tunnel syndrome   . Cellulitis and abscess of trunk   . Cholelithiasis 07/13/2014  . Chronic headaches   . Debility, unspecified   . Dermatophytosis of the body   . Dysrhythmia    history of  . Edema   . End stage renal disease on dialysis [facility])    "MWF; [facility]  in [facility]" (10/21/2014)  . Essential hypertension, benign   . GERD (gastroesophageal reflux disease)   . Gout, unspecified   . HTN (hypertension)   . Hypertrophy of prostate without urinary obstruction and other lower urinary tract symptoms (LUTS)   . Hypotension, unspecified   . Impotence of organic origin   . Insomnia, unspecified   . Kidney replaced by transplant   . Localization-related (focal) (partial) epilepsy and epileptic syndromes with complex partial seizures, without mention of intractable epilepsy   . Lumbago   . Memory loss   . OSA on CPAP   . Other and unspecified hyperlipidemia   . Other chronic nonalcoholic liver disease   . Other malaise and fatigue   . Other nonspecific abnormal serum enzyme levels   . Pain in joint, lower leg   . Pain in joint, upper arm   . Pneumonia "several times"  . Renal dialysis status(V45.11) 02/05/2010   restarted 01/02/13 ofter renal trransplant failure  . Secondary hyperparathyroidism (of renal origin)   . Shortness of breath   . Sleep apnea   . Tension headache   . Unspecified constipation   . Unspecified essential hypertension   . Unspecified hereditary and idiopathic peripheral neuropathy   . Unspecified vitamin D deficiency     Patient Active Problem  List   Diagnosis Date Noted  . Asthma with exacerbation 06/03/2018  . SOB (shortness of breath) 06/02/2018  . Right knee pain 06/02/2018  . Right tibial fracture 06/02/2018  . Asthma   . Elevated troponin 01/28/2018  . Cough 01/27/2018  . Cigarette smoker 07/11/2017  . ESRD (end stage renal disease) ([place]) 05/30/2017  . Anxiety 05/30/2017  . HCAP (healthcare-associated pneumonia) 05/30/2017  . Hypokalemia   . Acute respiratory failure with hypoxia ([place]) 01/28/2017  . 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  . Acute respiratory distress 10/31/2016  . Shunt malfunction 07/03/2016  . End stage renal disease ([place])   . Need for hepatitis C screening test 06/29/2015  . Fatigue  06/29/2015  . Myalgia 05/17/2015  . Acute dyspnea   . 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 Obesity hypoventilation syndrome ([place]) 12/09/2014  . Extrinsic asthma 12/09/2014  . Narcotic drug use 12/09/2014  . Hypersomnia with sleep apnea 12/09/2014  . SCC (squamous cell carcinoma), arm 11/09/2014  . Mild persistent chronic asthma without complication 65/46/5035  . ESRD on dialysis ([place]) 09/09/2014  . HLD (hyperlipidemia) 09/09/2014  . Chest pain 09/09/2014  . DOE (dyspnea on exertion) 06/23/2014  . Pancytopenia ([place]) 04/28/2014  . Thrombocytopenia ([place]) 04/10/2014  . Fungal dermatitis 03/17/2014  . Abdominal pain   . History of surgical procedure 09/17/2013  . Awaiting organ transplant 02/13/2013  . HTN (hypertension)   . Tension headache   . Memory loss   . Anemia in chronic renal disease   . Edema   . Thoracic or lumbosacral neuritis or radiculitis 03/27/2012  . Nerve root pain 03/27/2012  . History of kidney transplant 09/10/2011  . Hearing loss 08/16/2011  . Obesity 08/16/2011  . Difficulty hearing 08/16/2011  . OSA on CPAP 08/05/2011  . GIB (gastrointestinal bleeding) 10/28/2007    Past Surgical History:  Procedure Laterality Date  . AV FISTULA PLACEMENT Left ?2010   "forearm; at [facility]"  . BACK SURGERY    . CARDIAC CATHETERIZATION  03/21/2011  . CHOLECYSTECTOMY N/A 10/21/2014   Procedure: LAPAROSCOPIC CHOLECYSTECTOMY WITH INTRAOPERATIVE CHOLANGIOGRAM;  Surgeon: Autumn Messing III, MD;  Location: [facility];  Service: General;  Laterality: N/A;  . COLONOSCOPY    . 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 KIDNEY TRANSPLANT  08/17/2011   [place]  10/21/2014   w/IOC  . LEFT HEART CATHETERIZATION WITH CORONARY ANGIOGRAM N/A 03/21/2011   Procedure: LEFT HEART CATHETERIZATION WITH CORONARY ANGIOGRAM;  Surgeon: [name], MD;  Location: [facility] CATH LAB;  Service: Cardiovascular;  Laterality: N/A;  .  NEPHRECTOMY  08/2013   removed transplaned kidney  . POSTERIOR FUSION CERVICAL SPINE  06/25/2012   for spinal stenosis  . VASECTOMY  2010        Home Medications    Prior to Admission medications   Medication Sig Start Date End Date Taking? Authorizing Provider  acetaminophen (TYLENOL) 500 MG tablet Take 1 tablet (500 mg total) by mouth every 6 (six) hours as needed for moderate pain. 09/12/14   Samuella Cota, MD  albuterol (PROVENTIL HFA;VENTOLIN HFA) 108 (90 Base) MCG/ACT inhaler Inhale 2 puffs into the lungs every 6 (six) hours as needed for wheezing or shortness of breath. 06/05/17   Patrecia Pour, MD  aspirin EC 81 MG tablet Take 81 mg by mouth daily.    [provider]  b complex-vitamin c-folic acid (NEPHRO-VITE) 0.8 MG TABS tablet TAKE 1 TABLET BY MOUTH  EVERY DAY Patient taking differently: Take 1 tablet by mouth daily.  05/29/18   Reed, Tiffany L, DO  budesonide-formoterol (SYMBICORT) 160-4.5 MCG/ACT inhaler Inhale 2 puffs into the lungs every 12 (twelve) hours. 07/08/18   Tanda Rockers, MD  calcitRIOL (ROCALTROL) 0.5 MCG capsule Take 2 capsules (1 mcg total) by mouth every Monday, Wednesday, and Friday with hemodialysis. 06/04/18   Black, Lezlie Octave, NP  Cholecalciferol (VITAMIN D3) 5000 UNITS TABS Take 5,000 Units by mouth daily.     [provider]  clonazePAM (KLONOPIN) 0.5 MG tablet TAKE 1 TABLET (0.5 MG TOTAL) BY MOUTH 2 (TWO) TIMES DAILY AS NEEDED FOR ANXIETY. 04/21/18   Reed, Tiffany L, DO  diphenhydrAMINE (BENADRYL) 25 MG tablet Take 25 mg by mouth 2 (two) times daily as needed for allergies.     [provider]  guaiFENesin (MUCINEX) 600 MG 12 hr tablet Take 1,200 mg by mouth 2 (two) times daily.    [provider]  guaiFENesin-dextromethorphan (ROBITUSSIN DM) 100-10 MG/5ML syrup Take 10 mLs by mouth every 4 (four) hours as needed for cough.    [provider]  hydrALAZINE (APRESOLINE) 25 MG tablet Take 1 tablet (25 mg total) by  mouth every 8 (eight) hours. 06/03/18   Black, Lezlie Octave, NP  metoprolol tartrate 75 MG TABS Take 75 mg by mouth 2 (two) times daily. 06/03/18   Black, Lezlie Octave, NP  nicotine (NICODERM CQ - DOSED IN MG/24 HOURS) 21 mg/24hr patch Place 1 patch (21 mg total) onto the skin daily. 06/03/18   Black, Lezlie Octave, NP  omeprazole (PRILOSEC) 20 MG capsule TAKE 1 CAPSULE TWICE DAILY BEFORE A MEAL Patient taking differently: Take 20 mg by mouth 2 (two) times daily before a meal.  03/18/18   Milus Banister, MD  ondansetron (ZOFRAN ODT) 4 MG disintegrating tablet Take 1 tablet (4 mg total) by mouth every 8 (eight) hours as needed for nausea or vomiting. 10/26/16   Kinnie Feil, PA-C  pravastatin (PRAVACHOL) 40 MG tablet TAKE 1 TABLET EVERY EVENING Patient taking differently: Take 40 mg by mouth at bedtime.  05/16/18   Reed, Tiffany L, DO  PRESCRIPTION MEDICATION Inhale into the lungs at bedtime. CPAP    [provider]  sucroferric oxyhydroxide (VELPHORO) 500 MG chewable tablet Chew 500-1,500 mg by mouth See admin instructions. Chew 3 tablets (1500 mg) by mouth up to three times daily with meals and 1 tablet (500 mg) with snacks    [provider]  traMADol (ULTRAM) 50 MG tablet Take 1 tablet (50 mg total) by mouth daily as needed. Patient taking differently: Take 50 mg by mouth daily as needed (pain).  05/22/18   Gayland Curry, DO    Family History Family History  Adopted: Yes  Problem Relation Age of Onset  . Colon cancer Neg Hx   . Esophageal cancer Neg Hx   . Rectal cancer Neg Hx   . Stomach cancer Neg Hx     Social History Social History   Tobacco Use  . Smoking status: Current Every Day Smoker    Packs/day: 0.50    Years: 32.00    Pack years: 16.00    Types: Cigarettes  . Smokeless tobacco: Never Used  . Tobacco comment: 1 cigarette every couple days  Substance Use Topics  . Alcohol use: No    Alcohol/week: 0.0 standard drinks  . Drug use: No     Allergies   Codeine    Review of Systems  Review of Systems  Unable to perform ROS: Mental status change  Respiratory: Positive for shortness of breath.   Cardiovascular: Positive for syncope.  Neurological: Positive for headaches.     Physical Exam Updated Vital Signs BP (!) 176/84   Pulse 85   Resp 20   Ht 5\' 5"  (1.651 m)   Wt 101.1 kg   SpO2 95%   BMI 37.09 kg/m   Physical Exam Vitals signs and nursing note reviewed.  Constitutional:      General: He is not in acute distress.    Appearance: He is well-developed. He is obese.  HENT:     Head: Normocephalic and atraumatic.     Nose: Nose normal. No rhinorrhea.     Mouth/Throat:     Mouth: Mucous membranes are moist.     Pharynx: No oropharyngeal exudate or posterior oropharyngeal erythema.  Eyes:     Conjunctiva/sclera: Conjunctivae normal.     Pupils: Pupils are equal, round, and reactive to light.  Neck:     Musculoskeletal: Normal range of motion and neck supple.     Comments: No meningismus. Cardiovascular:     Rate and Rhythm: Normal rate and regular rhythm.     Heart sounds: Normal heart sounds. No murmur.  Pulmonary:     Effort: Pulmonary effort is normal. No respiratory distress.     Breath sounds: Rhonchi present.  Chest:     Chest wall: No tenderness.  Abdominal:     Palpations: Abdomen is soft.     Tenderness: There is no abdominal tenderness. There is no guarding or rebound.  Musculoskeletal: Normal range of motion.        General: No tenderness.     Comments: AVF LUE  Skin:    General: Skin is warm.     Capillary Refill: Capillary refill takes less than 2 seconds.  Neurological:     General: No focal deficit present.     Mental Status: He is alert. Mental status is at baseline. He is disoriented.     Cranial Nerves: No cranial nerve deficit.     Motor: No abnormal muscle tone.     Coordination: Coordination normal.     Comments: No ataxia on finger to nose bilaterally. No pronator drift. 5/5 strength throughout.  CN 2-12 intact.Equal grip strength. Sensation intact.   Oriented to person and place.  Psychiatric:        Behavior: Behavior normal.      ED Treatments / Results  Labs (all labs ordered are listed, but only abnormal results are displayed) Labs Reviewed  SARS CORONAVIRUS 2 (HOSPITAL ORDER, [facility])  CULTURE, BLOOD (ROUTINE X 2)  CULTURE, BLOOD (ROUTINE X 2)  BASIC METABOLIC PANEL  CBC  LACTIC ACID, PLASMA  LACTIC ACID, PLASMA  CBG MONITORING, ED  I-STAT ARTERIAL BLOOD GAS, ED  TROPONIN I (HIGH SENSITIVITY)    EKG EKG Interpretation  Date/Time:  Tuesday August 12 2018 06:01:06 EDT Ventricular Rate:  87 PR Interval:    QRS Duration: 89 QT Interval:  420 QTC Calculation: 506 R Axis:   78 Text Interpretation:  Sinus rhythm Consider left ventricular hypertrophy Prolonged QT interval No significant change was found Confirmed by Ezequiel Essex [phone]) on 08/12/2018 6:20:10 AM   Radiology Dg Chest Portable 1 View  Result Date: 08/12/2018 CLINICAL DATA:  Shortness of breath. EXAM: PORTABLE CHEST 1 VIEW COMPARISON:  06/01/2018. FINDINGS: Cardiomegaly. Pulmonary venous congestion and bilateral interstitial prominence. No pleural effusion or pneumothorax.  Prior cervical spine fusion. IMPRESSION: Cardiomegaly with pulmonary venous congestion and bilateral interstitial prominence suggesting CHF. Pneumonitis cannot be excluded. Electronically Signed   By: Marcello Moores  Register   On: 08/12/2018 07:15    Procedures Procedures (including critical care time)  Medications Ordered in ED Medications - No data to display   Initial Impression / Assessment and Plan / ED Course  I have reviewed the triage vital signs and the nursing notes.  Pertinent labs & imaging results that were available during my care of the patient were reviewed by me and considered in my medical decision making (see chart for details).       Dialysis patient presents by EMS after  episode of near syncope, shortness of breath and headache.  Denies hitting his head or trauma.  Uncertain whether patient actually lost consciousness.  Patient does appear to be mildly confused.  He is afebrile.  He follows commands but is somewhat disoriented.  EKG is unchanged without obvious changes of hyperkalemia.  Basic lab work will be obtained including CT head and chest x-ray.  We will also evaluate with lactic acid and blood cultures and ABG to assess for CO2 retention. Check rectal temp.   Patient likely need admission for altered mental status and likely needs dialysis.  Unclear circumstances what led to his presentation this morning. He seems to be an unreliable historian.  CT head and labs pending at shift change.  Dr. Regenia Skeeter to assume care.   Final Clinical Impressions(s) / ED Diagnoses   Final diagnoses:  None    ED Discharge Orders    None       Juri Dinning, Annie Main, MD 08/12/18 [phone]

## 2018-08-12 NOTE — Consult Note (Signed)
Renal Service Consult Note [facility]  Kirk [name] 08/12/2018 [name] Requesting Physician:  Dr [place]  Reason for Consult:  [name] w/ cough, SOB HPI: The patient is a 58 y.o. year-old with hx of OSA, seizures, hx failed renal Tx, HTN, gout and ESRD on HD presented to ED this am with HA, SOB, self-reported syncope, missed HD yest (Monday) because his car broke down.  Denies fevers to ED MD but endorses fevers/ chills/ myalgias x 2 days when we asked.  SOB onset was around 2 am today.  In ED CXR showed vasc congestion+ IS edema, creat is 19 , BUN 67 , K 4.3.  BP's high 220/ 90's. Asked to see for dialysis.    ROS  denies CP  no joint pain   no HA  no blurry vision  no rash  no diarrhea  no nausea/ vomiting   Past Medical History  Past Medical History:  Diagnosis Date  . Acute edema of lung, unspecified   . Acute, but ill-defined, cerebrovascular disease   . Allergy   . Anemia   . 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)   . Anxiety   . Asthma   . Asthma    moderate persistent  . Carpal tunnel syndrome   . Cellulitis and abscess of trunk   . Cholelithiasis 07/13/2014  . Chronic headaches   . Debility, unspecified   . Dermatophytosis of the body   . Dysrhythmia    history of  . Edema   . End stage renal disease on dialysis [facility])    "MWF; [facility]" (10/21/2014)  . Essential hypertension, benign   . GERD (gastroesophageal reflux disease)   . Gout, unspecified   . HTN (hypertension)   . Hypertrophy of prostate without urinary obstruction and other lower urinary tract symptoms (LUTS)   . Hypotension, unspecified   . Impotence of organic origin   . Insomnia, unspecified   . Kidney replaced by transplant   . Localization-related (focal) (partial) epilepsy and epileptic syndromes with complex partial seizures, without mention of intractable epilepsy   . Lumbago   . Memory loss   . OSA on CPAP   . Other and unspecified  hyperlipidemia   . Other chronic nonalcoholic liver disease   . Other malaise and fatigue   . Other nonspecific abnormal serum enzyme levels   . Pain in joint, lower leg   . Pain in joint, upper arm   . Pneumonia "several times"  . Renal dialysis status(V45.11) 02/05/2010   restarted 01/02/13 ofter renal trransplant failure  . Secondary hyperparathyroidism (of renal origin)   . Shortness of breath   . Sleep apnea   . Tension headache   . Unspecified constipation   . Unspecified essential hypertension   . Unspecified hereditary and idiopathic peripheral neuropathy   . Unspecified vitamin D deficiency    Past Surgical History  Past Surgical History:  Procedure Laterality Date  . AV FISTULA PLACEMENT Left ?2010   "forearm; at [facility]"  . BACK SURGERY    . CARDIAC CATHETERIZATION  03/21/2011  . CHOLECYSTECTOMY N/A 10/21/2014   Procedure: LAPAROSCOPIC CHOLECYSTECTOMY WITH INTRAOPERATIVE CHOLANGIOGRAM;  Surgeon: Autumn Messing III, MD;  Location: [facility];  Service: General;  Laterality: N/A;  . COLONOSCOPY    . 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 KIDNEY TRANSPLANT  08/17/2011   [place]  10/21/2014   w/IOC  .  LEFT HEART CATHETERIZATION WITH CORONARY ANGIOGRAM N/A 03/21/2011   Procedure: LEFT HEART CATHETERIZATION WITH CORONARY ANGIOGRAM;  Surgeon: [name], MD;  Location: [facility] CATH LAB;  Service: Cardiovascular;  Laterality: N/A;  . NEPHRECTOMY  08/2013   removed transplaned kidney  . POSTERIOR FUSION CERVICAL SPINE  06/25/2012   for spinal stenosis  . VASECTOMY  2010   Family History  Family History  Adopted: Yes  Problem Relation Age of Onset  . Colon cancer Neg Hx   . Esophageal cancer Neg Hx   . Rectal cancer Neg Hx   . Stomach cancer Neg Hx    Social History  reports that he has been smoking cigarettes. He has a 16.00 pack-year smoking history. He has never used smokeless tobacco. He reports that he does  not drink alcohol or use drugs. Allergies  Allergies  Allergen Reactions  . Codeine Nausea And Vomiting   Home medications Prior to Admission medications   Medication Sig Start Date End Date Taking? Authorizing Provider  acetaminophen (TYLENOL) 500 MG tablet Take 1 tablet (500 mg total) by mouth every 6 (six) hours as needed for moderate pain. Patient taking differently: Take 1,000 mg by mouth every 6 (six) hours as needed for moderate pain.  09/12/14  Yes Samuella Cota, MD  albuterol (PROVENTIL HFA;VENTOLIN HFA) 108 (90 Base) MCG/ACT inhaler Inhale 2 puffs into the lungs every 6 (six) hours as needed for wheezing or shortness of breath. 06/05/17  Yes Patrecia Pour, MD  aspirin EC 81 MG tablet Take 81 mg by mouth daily.   Yes [provider]  b complex-vitamin c-folic acid (NEPHRO-VITE) 0.8 MG TABS tablet TAKE 1 TABLET BY MOUTH EVERY DAY Patient taking differently: Take 1 tablet by mouth daily.  05/29/18  Yes Reed, Tiffany L, DO  budesonide-formoterol (SYMBICORT) 160-4.5 MCG/ACT inhaler Inhale 2 puffs into the lungs every 12 (twelve) hours. 07/08/18  Yes Tanda Rockers, MD  Cholecalciferol (VITAMIN D3) 5000 UNITS TABS Take 5,000 Units by mouth daily.    Yes [provider]  clonazePAM (KLONOPIN) 0.5 MG tablet TAKE 1 TABLET (0.5 MG TOTAL) BY MOUTH 2 (TWO) TIMES DAILY AS NEEDED FOR ANXIETY. 04/21/18  Yes Reed, Tiffany L, DO  diphenhydrAMINE (BENADRYL) 25 MG tablet Take 50 mg by mouth at bedtime.    Yes [provider]  guaiFENesin (MUCINEX) 600 MG 12 hr tablet Take 1,200 mg by mouth 2 (two) times daily.   Yes [provider]  guaiFENesin-dextromethorphan (ROBITUSSIN DM) 100-10 MG/5ML syrup Take 10 mLs by mouth every 4 (four) hours as needed for cough.   Yes [provider]  hydrALAZINE (APRESOLINE) 100 MG tablet Take 100 mg by mouth 2 (two) times daily.   Yes [provider]  hydrALAZINE (APRESOLINE) 25 MG tablet Take 1 tablet (25 mg total) by  mouth every 8 (eight) hours. Patient taking differently: Take 25 mg by mouth every 6 (six) hours.  06/03/18  Yes Black, Lezlie Octave, NP  metoprolol tartrate 75 MG TABS Take 75 mg by mouth 2 (two) times daily. 06/03/18  Yes Black, Lezlie Octave, NP  omeprazole (PRILOSEC) 20 MG capsule TAKE 1 CAPSULE TWICE DAILY BEFORE A MEAL Patient taking differently: Take 20 mg by mouth 2 (two) times daily before a meal.  03/18/18  Yes Milus Banister, MD  ondansetron (ZOFRAN ODT) 4 MG disintegrating tablet Take 1 tablet (4 mg total) by mouth every 8 (eight) hours as needed for nausea or vomiting. 10/26/16  Yes Carmon Sails  J, PA-C  pravastatin (PRAVACHOL) 40 MG tablet TAKE 1 TABLET EVERY EVENING Patient taking differently: Take 40 mg by mouth at bedtime.  05/16/18  Yes Reed, Tiffany L, DO  PRESCRIPTION MEDICATION Inhale into the lungs at bedtime. CPAP   Yes [provider]  sucroferric oxyhydroxide (VELPHORO) 500 MG chewable tablet Chew 500-1,500 mg by mouth See admin instructions. Chew 3 tablets (1500 mg) by mouth up to three times daily with meals and 1 tablet (500 mg) with snacks   Yes [provider]  traMADol (ULTRAM) 50 MG tablet Take 1 tablet (50 mg total) by mouth daily as needed. Patient taking differently: Take 50 mg by mouth daily as needed (pain).  05/22/18  Yes Reed, Tiffany L, DO  calcitRIOL (ROCALTROL) 0.5 MCG capsule Take 2 capsules (1 mcg total) by mouth every Monday, Wednesday, and Friday with hemodialysis. 06/04/18   Black, Lezlie Octave, NP  nicotine (NICODERM CQ - DOSED IN MG/24 HOURS) 21 mg/24hr patch Place 1 patch (21 mg total) onto the skin daily. Patient not taking: Reported on 08/12/2018 06/03/18   Radene Gunning, NP   Liver Function Tests No results for input(s): AST, ALT, ALKPHOS, BILITOT, PROT, ALBUMIN in the last 168 hours. No results for input(s): LIPASE, AMYLASE in the last 168 hours. CBC Recent Labs  Lab 08/12/18 0626 08/12/18 0745  WBC 7.2  --   HGB 8.2* 12.9*  HCT 25.4*  38.0*  MCV 98.1  --   PLT 80*  --    Basic Metabolic Panel Recent Labs  Lab 08/12/18 0626 08/12/18 0745  NA 140 139  K 4.3 4.1  CL 100  --   CO2 21*  --   GLUCOSE 143*  --   BUN 67*  --   CREATININE 19.59*  --   CALCIUM 9.1  --    Iron/TIBC/Ferritin/ %Sat    Component Value Date/Time   IRON 31 (L) 01/28/2018 0127   TIBC 197 (L) 01/28/2018 0127   FERRITIN 755 (H) 01/28/2018 0127   IRONPCTSAT 16 (L) 01/28/2018 0127    Vitals:   08/12/18 0815 08/12/18 0830 08/12/18 0845 08/12/18 0900  BP: (!) 190/76 (!) 207/108 (!) 204/91 (!) 201/99  Pulse: 88 86 83 88  Resp: 16  (!) 29 (!) 27  Temp:      TempSrc:      SpO2: 97% 95% 98% 97%  Weight:      Height:        Exam Gen restless, not in distress, nasal O2, a bit confused No rash, cyanosis or gangrene Sclera anicteric, throat clear  No jvd or bruits Chest bilat basilar crackles, no wheezing RRR no MRG Abd soft mild-mod distended no mass or ascites +bs GU normal male MS no joint effusions or deformity Ext 1+ pretib bilat edema, no wounds or ulcers Neuro is alert, Ox 3 , nf    Home meds:  - aspirin 81/ pravastatin 40 hs  - hydralazine 25 tid/ metoprolol 75 bid  - tramadol 50 qid prn/ clonazepam 0.5 bid prn  - budesonide-formoterol 160-4.5 2 bid  - omeprazole 40 / sucroferric oxyhydroxide ac tid    Outpt HD: [facility]  4h 400/800  96.5kg  2K/3.5Ca bath TDC  Hep 3000 - venofer 50 /wk    Assessment/ Plan: 1. Dyspnea/ SOB: pulm edema on CXR, vol ^ per exam, +missed HD.  Subjective fever w cough and chills, but no fever/ ^wbc here.  Suspect volume playing sig role here.  Plan HD today,  max UF.  2. ESRD: on HD MWF, missed yest. HD today and tomorrow.  3. HTN - cont meds, get vol down 4. OSA - on CPAP 5. Anemia ckd - get records 6. MBD ckd - cont meds      Kelly Splinter  MD 08/12/2018, 11:19 AM

## 2018-08-12 NOTE — H&P (Addendum)
History and Physical    Kirk [name] FYB:[id number] DOB: November 03, 1960 DOA: 08/12/2018  Referring MD/NP/PA: Crist Infante, MD  PCP: Gayland Curry, DO  Patient coming from: Via EMS  Chief Complaint: Shortness of breath  I have personally briefly reviewed patient's old medical records in [place]   HPI: Kirk [name] is a 58 y.o. male with medical history significant of ESRD on HD(M/W/F), anemia of chronic disease, hypertension, diastolic CHF, asthma, partial seizures, GERD, and OSA on CPAP; who presents with complaints of shortness of breath.  He reports that he recently missed hemodialysis yesterday as his car had broken down and was trying to walk to hemodialysis this morning.  However, as he was walking he began to feel short of breath and dizzy as though he may pass out.  He is not sure if he lost consciousness or not.  He does complain of a cough, headache, whole body myalgias, and states that he has had intermittent fevers off and all over this week.  Denies having any focal weakness or chest pain.  Upon EMS arrival patient was noted to be confused.  On rare occasions in patient has been confused after hemodialysis per wife, but this week more than normal.  Reportedly more forgetful with low-grade fevers and chills.  No seizure activity per his wife and was worked up and seen by neurology noted to be negative for seizures.  ED Course: Upon admission into the emergency department patient was noted to be afebrile temperature 99.1 F, blood pressure 174/86-207/108, and all other vital signs relatively maintained.  Labs revealed WBC 7.2, hemoglobin 12.9, platelets 80, BUN 67, creatinine 19.59, lactic acid 1, and troponin 75.  ABG appears compensated, but PO2 was 61.  COVID-19 screening negative.  Chest x-ray reveals cardiomegaly with signs concerning for CHF exacerbation.  CT scan of his brain did not show any acute abnormalities.  Patient was given 8 puffs of the albuterol inhaler.   Nephrology consulted for need of hemodialysis.  Blood cultures were obtained.  TRH called to admit.  Review of Systems  Constitutional: Positive for chills, fever and malaise/fatigue.  HENT: Negative for congestion and ear pain.   Eyes: Negative for photophobia and pain.  Respiratory: Positive for cough and shortness of breath.   Cardiovascular: Negative for chest pain and leg swelling.  Gastrointestinal: Negative for abdominal pain, nausea and vomiting.  Genitourinary: Negative for dysuria and frequency.  Musculoskeletal: Positive for myalgias. Negative for joint pain.  Skin: Negative for rash.  Neurological: Positive for dizziness and headaches. Negative for focal weakness and seizures.  Psychiatric/Behavioral: Positive for memory loss. Negative for substance abuse and suicidal ideas.    Past Medical History:  Diagnosis Date  . Acute edema of lung, unspecified   . Acute, but ill-defined, cerebrovascular disease   . Allergy   . Anemia   . 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)   . Anxiety   . Asthma   . Asthma    moderate persistent  . Carpal tunnel syndrome   . Cellulitis and abscess of trunk   . Cholelithiasis 07/13/2014  . Chronic headaches   . Debility, unspecified   . Dermatophytosis of the body   . Dysrhythmia    history of  . Edema   . End stage renal disease on dialysis [facility])    "MWF; [facility]" (10/21/2014)  . Essential hypertension, benign   . GERD (gastroesophageal reflux disease)   . Gout, unspecified   . HTN (hypertension)   .  Hypertrophy of prostate without urinary obstruction and other lower urinary tract symptoms (LUTS)   . Hypotension, unspecified   . Impotence of organic origin   . Insomnia, unspecified   . Kidney replaced by transplant   . Localization-related (focal) (partial) epilepsy and epileptic syndromes with complex partial seizures, without mention of intractable epilepsy   . Lumbago   . Memory loss   . OSA on CPAP    . Other and unspecified hyperlipidemia   . Other chronic nonalcoholic liver disease   . Other malaise and fatigue   . Other nonspecific abnormal serum enzyme levels   . Pain in joint, lower leg   . Pain in joint, upper arm   . Pneumonia "several times"  . Renal dialysis status(V45.11) 02/05/2010   restarted 01/02/13 ofter renal trransplant failure  . Secondary hyperparathyroidism (of renal origin)   . Shortness of breath   . Sleep apnea   . Tension headache   . Unspecified constipation   . Unspecified essential hypertension   . Unspecified hereditary and idiopathic peripheral neuropathy   . Unspecified vitamin D deficiency     Past Surgical History:  Procedure Laterality Date  . AV FISTULA PLACEMENT Left ?2010   "forearm; at [facility]"  . BACK SURGERY    . CARDIAC CATHETERIZATION  03/21/2011  . CHOLECYSTECTOMY N/A 10/21/2014   Procedure: LAPAROSCOPIC CHOLECYSTECTOMY WITH INTRAOPERATIVE CHOLANGIOGRAM;  Surgeon: Autumn Messing III, MD;  Location: [place];  Service: General;  Laterality: N/A;  . COLONOSCOPY    . 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 KIDNEY TRANSPLANT  08/17/2011   [place]  10/21/2014   w/IOC  . LEFT HEART CATHETERIZATION WITH CORONARY ANGIOGRAM N/A 03/21/2011   Procedure: LEFT HEART CATHETERIZATION WITH CORONARY ANGIOGRAM;  Surgeon: [name], MD;  Location: [facility] C[facility] LAB;  Service: Cardiovascular;  Laterality: N/A;  . NEPHRECTOMY  08/2013   removed transplaned kidney  . POSTERIOR FUSION CERVICAL SPINE  06/25/2012   for spinal stenosis  . VASECTOMY  2010     reports that he has been smoking cigarettes. He has a 16.00 pack-year smoking history. He has never used smokeless tobacco. He reports that he does not drink alcohol or use drugs.  Allergies  Allergen Reactions  . Codeine Nausea And Vomiting    Family History  Adopted: Yes  Problem Relation Age of Onset  . Colon cancer Neg Hx   .  Esophageal cancer Neg Hx   . Rectal cancer Neg Hx   . Stomach cancer Neg Hx     Prior to Admission medications   Medication Sig Start Date End Date Taking? Authorizing Provider  acetaminophen (TYLENOL) 500 MG tablet Take 1 tablet (500 mg total) by mouth every 6 (six) hours as needed for moderate pain. 09/12/14   Samuella Cota, MD  albuterol (PROVENTIL HFA;VENTOLIN HFA) 108 (90 Base) MCG/ACT inhaler Inhale 2 puffs into the lungs every 6 (six) hours as needed for wheezing or shortness of breath. 06/05/17   Patrecia Pour, MD  aspirin EC 81 MG tablet Take 81 mg by mouth daily.    [provider]  b complex-vitamin c-folic acid (NEPHRO-VITE) 0.8 MG TABS tablet TAKE 1 TABLET BY MOUTH EVERY DAY Patient taking differently: Take 1 tablet by mouth daily.  05/29/18   Reed, Tiffany L, DO  budesonide-formoterol (SYMBICORT) 160-4.5 MCG/ACT inhaler Inhale 2 puffs into the lungs every 12 (twelve) hours. 07/08/18   Christinia Gully  B, MD  calcitRIOL (ROCALTROL) 0.5 MCG capsule Take 2 capsules (1 mcg total) by mouth every Monday, Wednesday, and Friday with hemodialysis. 06/04/18   Black, Lezlie Octave, NP  Cholecalciferol (VITAMIN D3) 5000 UNITS TABS Take 5,000 Units by mouth daily.     [provider]  clonazePAM (KLONOPIN) 0.5 MG tablet TAKE 1 TABLET (0.5 MG TOTAL) BY MOUTH 2 (TWO) TIMES DAILY AS NEEDED FOR ANXIETY. 04/21/18   Reed, Tiffany L, DO  diphenhydrAMINE (BENADRYL) 25 MG tablet Take 25 mg by mouth 2 (two) times daily as needed for allergies.     [provider]  guaiFENesin (MUCINEX) 600 MG 12 hr tablet Take 1,200 mg by mouth 2 (two) times daily.    [provider]  guaiFENesin-dextromethorphan (ROBITUSSIN DM) 100-10 MG/5ML syrup Take 10 mLs by mouth every 4 (four) hours as needed for cough.    [provider]  hydrALAZINE (APRESOLINE) 25 MG tablet Take 1 tablet (25 mg total) by mouth every 8 (eight) hours. 06/03/18   Black, Lezlie Octave, NP  metoprolol tartrate 75 MG TABS  Take 75 mg by mouth 2 (two) times daily. 06/03/18   Black, Lezlie Octave, NP  nicotine (NICODERM CQ - DOSED IN MG/24 HOURS) 21 mg/24hr patch Place 1 patch (21 mg total) onto the skin daily. 06/03/18   Black, Lezlie Octave, NP  omeprazole (PRILOSEC) 20 MG capsule TAKE 1 CAPSULE TWICE DAILY BEFORE A MEAL Patient taking differently: Take 20 mg by mouth 2 (two) times daily before a meal.  03/18/18   Milus Banister, MD  ondansetron (ZOFRAN ODT) 4 MG disintegrating tablet Take 1 tablet (4 mg total) by mouth every 8 (eight) hours as needed for nausea or vomiting. 10/26/16   Kinnie Feil, PA-C  pravastatin (PRAVACHOL) 40 MG tablet TAKE 1 TABLET EVERY EVENING Patient taking differently: Take 40 mg by mouth at bedtime.  05/16/18   Reed, Tiffany L, DO  PRESCRIPTION MEDICATION Inhale into the lungs at bedtime. CPAP    [provider]  sucroferric oxyhydroxide (VELPHORO) 500 MG chewable tablet Chew 500-1,500 mg by mouth See admin instructions. Chew 3 tablets (1500 mg) by mouth up to three times daily with meals and 1 tablet (500 mg) with snacks    [provider]  traMADol (ULTRAM) 50 MG tablet Take 1 tablet (50 mg total) by mouth daily as needed. Patient taking differently: Take 50 mg by mouth daily as needed (pain).  05/22/18   Gayland Curry, DO    Physical Exam:  Constitutional: Obese male currently in discomfort  Vitals:   08/12/18 0645 08/12/18 0700 08/12/18 0715 08/12/18 0742  BP: (!) 176/84 (!) 185/83 (!) 174/86   Pulse: 85 86 85   Resp: 20     Temp:    99.1 F (37.3 C)  TempSrc:    Rectal  SpO2: 95% 96% 93%   Weight:      Height:       Eyes: PERRL, lids and conjunctivae normal ENMT: Mucous membranes are moist. Posterior pharynx clear of any exudate or lesions.  Neck: normal, supple, no masses, no thyromegaly Respiratory: Bilateral crackles appreciated. Cardiovascular: Regular rate and rhythm, no murmurs / rubs / gallops. No extremity edema. 2+ pedal pulses. No carotid bruits.   Abdomen: no tenderness, no masses palpated. No hepatosplenomegaly. Bowel sounds positive.  Musculoskeletal: no clubbing / cyanosis. No joint deformity upper and lower extremities. Good ROM, no contractures. Normal muscle tone.  Skin: no rashes, lesions, ulcers. No induration Neurologic: CN  2-12 grossly intact. Sensation intact, DTR normal. Strength 5/5 in all 4.  Psychiatric: Poor recent memory. Alert and oriented x 2.      Labs on Admission: I have personally reviewed following labs and imaging studies  CBC: Recent Labs  Lab 08/12/18 0626 08/12/18 0745  WBC 7.2  --   HGB 8.2* 12.9*  HCT 25.4* 38.0*  MCV 98.1  --   PLT 80*  --    Basic Metabolic Panel: Recent Labs  Lab 08/12/18 0626 08/12/18 0745  NA 140 139  K 4.3 4.1  CL 100  --   CO2 21*  --   GLUCOSE 143*  --   BUN 67*  --   CREATININE 19.59*  --   CALCIUM 9.1  --    GFR: Estimated Creatinine Clearance: 4.6 mL/min (A) (by C-G formula based on SCr of 19.59 mg/dL (H)). Liver Function Tests: No results for input(s): AST, ALT, ALKPHOS, BILITOT, PROT, ALBUMIN in the last 168 hours. No results for input(s): LIPASE, AMYLASE in the last 168 hours. No results for input(s): AMMONIA in the last 168 hours. Coagulation Profile: No results for input(s): INR, PROTIME in the last 168 hours. Cardiac Enzymes: No results for input(s): CKTOTAL, CKMB, CKMBINDEX, TROPONINI in the last 168 hours. BNP (last 3 results) No results for input(s): PROBNP in the last 8760 hours. HbA1C: No results for input(s): HGBA1C in the last 72 hours. CBG: Recent Labs  Lab 08/12/18 0756  GLUCAP 124*   Lipid Profile: No results for input(s): CHOL, HDL, LDLCALC, TRIG, CHOLHDL, LDLDIRECT in the last 72 hours. Thyroid Function Tests: No results for input(s): TSH, T4TOTAL, FREET4, T3FREE, THYROIDAB in the last 72 hours. Anemia Panel: No results for input(s): VITAMINB12, FOLATE, FERRITIN, TIBC, IRON, RETICCTPCT in the last 72 hours. Urine analysis:     Component Value Date/Time   COLORURINE YELLOW 02/19/2017 1520   APPEARANCEUR HAZY (A) 02/19/2017 1520   LABSPEC 1.012 02/19/2017 1520   PHURINE 8.0 02/19/2017 1520   GLUCOSEU 50 (A) 02/19/2017 1520   HGBUR MODERATE (A) 02/19/2017 1520   BILIRUBINUR NEGATIVE 02/19/2017 1520   KETONESUR NEGATIVE 02/19/2017 1520   PROTEINUR >=300 (A) 02/19/2017 1520   UROBILINOGEN 0.2 04/06/2014 1030   NITRITE NEGATIVE 02/19/2017 1520   LEUKOCYTESUR MODERATE (A) 02/19/2017 1520   Sepsis Labs: Recent Results (from the past 240 hour(s))  SARS Coronavirus 2 (CEPHEID- Performed in [place] hospital lab), Hosp Order     Status: None   Collection Time: 08/12/18  6:27 AM   Specimen: Nasopharyngeal Swab  Result Value Ref Range Status   SARS Coronavirus 2 NEGATIVE NEGATIVE Final    Comment: (NOTE) If result is NEGATIVE SARS-CoV-2 target nucleic acids are NOT DETECTED. The SARS-CoV-2 RNA is generally detectable in upper and lower  respiratory specimens during the acute phase of infection. The lowest  concentration of SARS-CoV-2 viral copies this assay can detect is 250  copies / mL. A negative result does not preclude SARS-CoV-2 infection  and should not be used as the sole basis for treatment or other  patient management decisions.  A negative result may occur with  improper specimen collection / handling, submission of specimen other  than nasopharyngeal swab, presence of viral mutation(s) within the  areas targeted by this assay, and inadequate number of viral copies  (<250 copies / mL). A negative result must be combined with clinical  observations, patient history, and epidemiological information. If result is POSITIVE SARS-CoV-2 target nucleic acids are DETECTED. The SARS-CoV-2 RNA  is generally detectable in upper and lower  respiratory specimens dur ing the acute phase of infection.  Positive  results are indicative of active infection with SARS-CoV-2.  Clinical  correlation with patient  history and other diagnostic information is  necessary to determine patient infection status.  Positive results do  not rule out bacterial infection or co-infection with other viruses. If result is PRESUMPTIVE POSTIVE SARS-CoV-2 nucleic acids MAY BE PRESENT.   A presumptive positive result was obtained on the submitted specimen  and confirmed on repeat testing.  While 2019 novel coronavirus  (SARS-CoV-2) nucleic acids may be present in the submitted sample  additional confirmatory testing may be necessary for epidemiological  and / or clinical management purposes  to differentiate between  SARS-CoV-2 and other Sarbecovirus currently known to infect humans.  If clinically indicated additional testing with an alternate test  methodology [phone]) is advised. The SARS-CoV-2 RNA is generally  detectable in upper and lower respiratory sp ecimens during the acute  phase of infection. The expected result is Negative. Fact Sheet for Patients:  StrictlyIdeas.no Fact Sheet for Healthcare Providers: BankingDealers.co.za This test is not yet approved or cleared by the [place] FDA and has been authorized for detection and/or diagnosis of SARS-CoV-2 by FDA under an Emergency Use Authorization (EUA).  This EUA will remain in effect (meaning this test can be used) for the duration of the COVID-19 declaration under Section 564(b)(1) of the Act, 21 U.S.C. section 360bbb-3(b)(1), unless the authorization is terminated or revoked sooner. Performed at [facility], [place] [address], [place], [place] [address]      Radiological Exams on Admission: Ct Head Wo Contrast  Result Date: 08/12/2018 CLINICAL DATA:  Altered mental status, headache EXAM: CT HEAD WITHOUT CONTRAST TECHNIQUE: Contiguous axial images were obtained from the base of the skull through the vertex without intravenous contrast. COMPARISON:  05/01/2017 FINDINGS: Brain: No evidence of  acute infarction, hemorrhage, hydrocephalus, extra-axial collection or mass lesion/mass effect. Mild chronic low-density changes within the periventricular and subcortical white matter suggestive of chronic microvascular ischemic disease. Vascular: Scattered calcified atherosclerotic changes involving the large vessels at the skull base. No unexpected hyperdense vessel. Skull: Normal. Negative for fracture or focal lesion. Sinuses/Orbits: No acute finding. Other: None. IMPRESSION: No acute intracranial findings. Electronically Signed   By: Davina Poke M.D.   On: 08/12/2018 08:06   Dg Chest Portable 1 View  Result Date: 08/12/2018 CLINICAL DATA:  Shortness of breath. EXAM: PORTABLE CHEST 1 VIEW COMPARISON:  06/01/2018. FINDINGS: Cardiomegaly. Pulmonary venous congestion and bilateral interstitial prominence. No pleural effusion or pneumothorax. Prior cervical spine fusion. IMPRESSION: Cardiomegaly with pulmonary venous congestion and bilateral interstitial prominence suggesting CHF. Pneumonitis cannot be excluded. Electronically Signed   By: Marcello Moores  Register   On: 08/12/2018 07:15    EKG: Independently reviewed.  Sinus rhythm at 87 bpm with QTc prolonged at 506  Assessment/Plan Acute encephalopathy: Patient noted to be confused and complaining of the headache and intermittent fevers.  Denies any focal weakness no fever noted on admission.  Blood pressure is elevated into the 200s. Negative for COVID-19 and CT scan of the brain negative for any acute abnormalities.  Differential includes but not limited to metabolic cause with missed dialysis vs hypertensive encephalopathy vs. cardiac vs. respiratory vs. seizure with postictal state. -Admit to a medical telemetry bed -Continue droplet and contact precautions -Neurochecks  -F/u send out COVID testing -Follow-up blood cultures -Seizure precaution s -Follow-up blood cultures -Check liver function studies and  ammonia level -Follow-up telemetry  -May warrant further work-up  Acute respiratory distress secondary to pulmonary edema, history of asthma: On admission patient maintaining O2 saturations on room air, but ABG revealed pH 7.385, PCO2 34.7, and PO2  61.  Physical exam reveals positive crackles no wheezes appreciated.  Chest x-ray showing signs of CHF likely related with patient's recently missed hemodialysis session. -Continuous pulse oximetry with nasal cannula oxygen -Albuterol inhaler as needed shortness of breath/wheezing -Pharmacy substitution of Dulera for Symbicort inhaler  ESRD on HD: Patient normally dialyzes Monday, Wednesday, Friday.  Reports missing regularly scheduled hemodialysis to do car breaking down on 7/20.  Labs revealed potassium 4.3, BUN 67, and creatinine 19.53.  Nephrology consulted. -Continue home medication regimen -HD per nephrology  Hypertensive urgency: Acute.  Blood pressures elevated up to 207/108.  -Continue hydralazine, metoprolol, -Hydralazine IV as needed for elevated blood pressure  Elevated troponin: Acute on chronic.  High-sensitivity troponin elevated at 76.  Suspect secondary to demand. -Follow-up repeat troponin  Diastolic congestive heart failure: Last EF noted to be 55% with grade 2 diastolic dysfunction on 03/8248.  Chest x-ray showing pulmonary edema  Prolonged QT interval: Acute.  QTc prolonged at 506. -Avoid QT prolonging medication -Recheck EKG in a.m. after dialysis -Correct for any electrolyte abnormalities  Anemia of chronic kidney disease: Hemoglobin 12.9 which appears improved from previous baseline 8-10g/dL. -Continue to monitor  Thrombocytopenia: Acute on chronic.  Platelet count 80 on admission which is lower than previous.  Patient denies any reports of bleeding. -Continue to monitor  Hyperlipidemia -Continue pravastatin  Anxiety -Klonopin as needed  OSA on CPAP -Continue CPAP at night  DVT prophylaxis: SCDs Code Status: Full Family Communication: Wife  updated over the phone Disposition Plan: Possible discharge home in 1 to 2 days Consults called: Nephrology Admission status: Observation  Norval Morton MD [facility] Pager [phone]   If 7PM-7AM, please contact night-coverage [email or web address] Password [facility]  08/12/2018, 8:33 AM

## 2018-08-12 NOTE — ED Notes (Signed)
ED TO INPATIENT HANDOFF REPORT  ED Nurse Name and Phone #: Sharyn Lull  38  S Name/Age/Gender Kirk [name] 58 y.o. male Room/Bed: 023C/023C  Code Status   Code Status: Full Code  Home/SNF/Other Home Patient oriented to: situation Is this baseline? Yes   Triage Complete: Triage complete  Chief Complaint ams  Triage Note Pt BIB [facility] for missing dialysis on Monday, headache, SOB, and self reported syncope. EMS advised all vital signs were normal for the patient. EMS reports Pt CAOx4, 12 lead unremarkable,    Allergies Allergies  Allergen Reactions  . Codeine Nausea And Vomiting    Level of Care/Admitting Diagnosis ED Disposition    ED Disposition Condition [facility] Area: [place] [[id number]]  Level of Care: Telemetry Medical [104]  I expect the patient will be discharged within 24 hours: No (not a candidate for 5C-Observation unit)  Covid Evaluation: Person Under Investigation (PUI)  Diagnosis: Acute encephalopathy [127517]  Admitting Physician: Norval Morton [[id number]]  Attending Physician: Norval Morton [[id number]]  PT Class (Do Not Modify): Observation [104]  PT Acc Code (Do Not Modify): Observation [10022]       B Medical/Surgery History Past Medical History:  Diagnosis Date  . Acute edema of lung, unspecified   . Acute, but ill-defined, cerebrovascular disease   . Allergy   . Anemia   . 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)   . Anxiety   . Asthma   . Asthma    moderate persistent  . Carpal tunnel syndrome   . Cellulitis and abscess of trunk   . Cholelithiasis 07/13/2014  . Chronic headaches   . Debility, unspecified   . Dermatophytosis of the body   . Dysrhythmia    history of  . Edema   . End stage renal disease on dialysis [facility])    "MWF; [facility]" (10/21/2014)  . Essential hypertension, benign   . GERD (gastroesophageal reflux disease)   . Gout, unspecified   . HTN  (hypertension)   . Hypertrophy of prostate without urinary obstruction and other lower urinary tract symptoms (LUTS)   . Hypotension, unspecified   . Impotence of organic origin   . Insomnia, unspecified   . Kidney replaced by transplant   . Localization-related (focal) (partial) epilepsy and epileptic syndromes with complex partial seizures, without mention of intractable epilepsy   . Lumbago   . Memory loss   . OSA on CPAP   . Other and unspecified hyperlipidemia   . Other chronic nonalcoholic liver disease   . Other malaise and fatigue   . Other nonspecific abnormal serum enzyme levels   . Pain in joint, lower leg   . Pain in joint, upper arm   . Pneumonia "several times"  . Renal dialysis status(V45.11) 02/05/2010   restarted 01/02/13 ofter renal trransplant failure  . Secondary hyperparathyroidism (of renal origin)   . Shortness of breath   . Sleep apnea   . Tension headache   . Unspecified constipation   . Unspecified essential hypertension   . Unspecified hereditary and idiopathic peripheral neuropathy   . Unspecified vitamin D deficiency    Past Surgical History:  Procedure Laterality Date  . AV FISTULA PLACEMENT Left ?2010   "forearm; at [facility]"  . BACK SURGERY    . CARDIAC CATHETERIZATION  03/21/2011  . CHOLECYSTECTOMY N/A 10/21/2014   Procedure: LAPAROSCOPIC CHOLECYSTECTOMY WITH INTRAOPERATIVE CHOLANGIOGRAM;  Surgeon: Autumn Messing III, MD;  Location: [facility];  Service: General;  Laterality: N/A;  . COLONOSCOPY    . 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 KIDNEY TRANSPLANT  08/17/2011   [place]  10/21/2014   w/IOC  . LEFT HEART CATHETERIZATION WITH CORONARY ANGIOGRAM N/A 03/21/2011   Procedure: LEFT HEART CATHETERIZATION WITH CORONARY ANGIOGRAM;  Surgeon: [name], MD;  Location: [facility] CATH LAB;  Service: Cardiovascular;  Laterality: N/A;  . NEPHRECTOMY  08/2013   removed transplaned kidney  .  POSTERIOR FUSION CERVICAL SPINE  06/25/2012   for spinal stenosis  . VASECTOMY  2010     A IV Location/Drains/Wounds Patient Lines/Drains/Airways Status   Active Line/Drains/Airways    Name:   Placement date:   Placement time:   Site:   Days:   Peripheral IV 08/12/18 Right;Anterior Wrist   08/12/18    0625    Wrist   less than 1   Fistula / Graft Left Forearm   -    -    Forearm             Intake/Output Last 24 hours No intake or output data in the 24 hours ending 08/12/18 0933  Labs/Imaging Results for orders placed or performed during the hospital encounter of 08/12/18 (from the past 48 hour(s))  Basic metabolic panel     Status: Abnormal   Collection Time: 08/12/18  6:26 AM  Result Value Ref Range   Sodium 140 135 - 145 mmol/L   Potassium 4.3 3.5 - 5.1 mmol/L   Chloride 100 98 - 111 mmol/L   CO2 21 (L) 22 - 32 mmol/L   Glucose, Bld 143 (H) 70 - 99 mg/dL   BUN 67 (H) 6 - 20 mg/dL   Creatinine, Ser 19.59 (H) 0.61 - 1.24 mg/dL   Calcium 9.1 8.9 - 10.3 mg/dL   GFR calc non Af Amer 2 (L) >60 mL/min   GFR calc Af Amer 3 (L) >60 mL/min   Anion gap 19 (H) 5 - 15    Comment: Performed at [facility], [address] [place], [place] [address]  CBC     Status: Abnormal   Collection Time: 08/12/18  6:26 AM  Result Value Ref Range   WBC 7.2 4.0 - 10.5 K/uL   RBC 2.59 (L) 4.22 - 5.81 MIL/uL   Hemoglobin 8.2 (L) 13.0 - 17.0 g/dL   HCT 25.4 (L) 39.0 - 52.0 %   MCV 98.1 80.0 - 100.0 fL   MCH 31.7 26.0 - 34.0 pg   MCHC 32.3 30.0 - 36.0 g/dL   RDW 14.9 11.5 - 15.5 %   Platelets 80 (L) 150 - 400 K/uL    Comment: REPEATED TO VERIFY PLATELET COUNT CONFIRMED BY SMEAR SPECIMEN CHECKED FOR CLOTS Immature Platelet Fraction may be clinically indicated, consider ordering this additional test [id number]    nRBC 0.0 0.0 - 0.2 %    Comment: Performed at [facility], [place] [address] [place], [place] [address]  Troponin I (High Sensitivity)     Status: Abnormal   Collection  Time: 08/12/18  6:26 AM  Result Value Ref Range   Troponin I (High Sensitivity) 75 (H) <18 ng/L    Comment: (NOTE) Elevated high sensitivity troponin I (hsTnI) values and significant  changes across serial measurements may suggest ACS but many other  chronic and acute conditions are known to elevate hsTnI results.  Refer to the "Links" section for chest pain algorithms and  additional  guidance. Performed at [facility], [place] [address], [place], [place] [address]   SARS Coronavirus 2 (CEPHEID- Performed in [place] hospital lab), Hosp Order     Status: None   Collection Time: 08/12/18  6:27 AM   Specimen: Nasopharyngeal Swab  Result Value Ref Range   SARS Coronavirus 2 NEGATIVE NEGATIVE    Comment: (NOTE) If result is NEGATIVE SARS-CoV-2 target nucleic acids are NOT DETECTED. The SARS-CoV-2 RNA is generally detectable in upper and lower  respiratory specimens during the acute phase of infection. The lowest  concentration of SARS-CoV-2 viral copies this assay can detect is 250  copies / mL. A negative result does not preclude SARS-CoV-2 infection  and should not be used as the sole basis for treatment or other  patient management decisions.  A negative result may occur with  improper specimen collection / handling, submission of specimen other  than nasopharyngeal swab, presence of viral mutation(s) within the  areas targeted by this assay, and inadequate number of viral copies  (<250 copies / mL). A negative result must be combined with clinical  observations, patient history, and epidemiological information. If result is POSITIVE SARS-CoV-2 target nucleic acids are DETECTED. The SARS-CoV-2 RNA is generally detectable in upper and lower  respiratory specimens dur ing the acute phase of infection.  Positive  results are indicative of active infection with SARS-CoV-2.  Clinical  correlation with patient history and other diagnostic information is  necessary to determine  patient infection status.  Positive results do  not rule out bacterial infection or co-infection with other viruses. If result is PRESUMPTIVE POSTIVE SARS-CoV-2 nucleic acids MAY BE PRESENT.   A presumptive positive result was obtained on the submitted specimen  and confirmed on repeat testing.  While 2019 novel coronavirus  (SARS-CoV-2) nucleic acids may be present in the submitted sample  additional confirmatory testing may be necessary for epidemiological  and / or clinical management purposes  to differentiate between  SARS-CoV-2 and other Sarbecovirus currently known to infect humans.  If clinically indicated additional testing with an alternate test  methodology [phone]) is advised. The SARS-CoV-2 RNA is generally  detectable in upper and lower respiratory sp ecimens during the acute  phase of infection. The expected result is Negative. Fact Sheet for Patients:  StrictlyIdeas.no Fact Sheet for Healthcare Providers: BankingDealers.co.za This test is not yet approved or cleared by the [place] FDA and has been authorized for detection and/or diagnosis of SARS-CoV-2 by FDA under an Emergency Use Authorization (EUA).  This EUA will remain in effect (meaning this test can be used) for the duration of the COVID-19 declaration under Section 564(b)(1) of the Act, 21 U.S.C. section 360bbb-3(b)(1), unless the authorization is terminated or revoked sooner. Performed at [facility], [place] [address] [place], [place] [address]   Lactic acid, plasma     Status: None   Collection Time: 08/12/18  7:02 AM  Result Value Ref Range   Lactic Acid, Venous 1.0 0.5 - 1.9 mmol/L    Comment: Performed at [place] [address] [place], [place] [address]  I-STAT 7, (LYTES, BLD GAS, ICA, H+H)     Status: Abnormal   Collection Time: 08/12/18  7:45 AM  Result Value Ref Range   pH, Arterial 7.385 7.350 - 7.450   pCO2 arterial 34.7  32.0 - 48.0 mmHg   pO2, Arterial 61.0 (L) 83.0 - 108.0 mmHg   Bicarbonate 20.8 20.0 - 28.0 mmol/L  TCO2 22 22 - 32 mmol/L   O2 Saturation 91.0 %   Acid-base deficit 4.0 (H) 0.0 - 2.0 mmol/L   Sodium 139 135 - 145 mmol/L   Potassium 4.1 3.5 - 5.1 mmol/L   Calcium, Ion 1.12 (L) 1.15 - 1.40 mmol/L   HCT 38.0 (L) 39.0 - 52.0 %   Hemoglobin 12.9 (L) 13.0 - 17.0 g/dL   Patient temperature 98.6 F    Collection site RADIAL, ALLEN'S TEST ACCEPTABLE    Drawn by RT    Sample type ARTERIAL   CBG monitoring, ED     Status: Abnormal   Collection Time: 08/12/18  7:56 AM  Result Value Ref Range   Glucose-Capillary 124 (H) 70 - 99 mg/dL  Troponin I (High Sensitivity)     Status: Abnormal   Collection Time: 08/12/18  8:00 AM  Result Value Ref Range   Troponin I (High Sensitivity) 69 (H) <18 ng/L    Comment: (NOTE) Elevated high sensitivity troponin I (hsTnI) values and significant  changes across serial measurements may suggest ACS but many other  chronic and acute conditions are known to elevate hsTnI results.  Refer to the "Links" section for chest pain algorithms and additional  guidance. Performed at [facility], [place] [address] [place], [place] [address]    Ct Head Wo Contrast  Result Date: 08/12/2018 CLINICAL DATA:  Altered mental status, headache EXAM: CT HEAD WITHOUT CONTRAST TECHNIQUE: Contiguous axial images were obtained from the base of the skull through the vertex without intravenous contrast. COMPARISON:  05/01/2017 FINDINGS: Brain: No evidence of acute infarction, hemorrhage, hydrocephalus, extra-axial collection or mass lesion/mass effect. Mild chronic low-density changes within the periventricular and subcortical white matter suggestive of chronic microvascular ischemic disease. Vascular: Scattered calcified atherosclerotic changes involving the large vessels at the skull base. No unexpected hyperdense vessel. Skull: Normal. Negative for fracture or focal lesion.  Sinuses/Orbits: No acute finding. Other: None. IMPRESSION: No acute intracranial findings. Electronically Signed   By: Davina Poke M.D.   On: 08/12/2018 08:06   Dg Chest Portable 1 View  Result Date: 08/12/2018 CLINICAL DATA:  Shortness of breath. EXAM: PORTABLE CHEST 1 VIEW COMPARISON:  06/01/2018. FINDINGS: Cardiomegaly. Pulmonary venous congestion and bilateral interstitial prominence. No pleural effusion or pneumothorax. Prior cervical spine fusion. IMPRESSION: Cardiomegaly with pulmonary venous congestion and bilateral interstitial prominence suggesting CHF. Pneumonitis cannot be excluded. Electronically Signed   By: Marcello Moores  Register   On: 08/12/2018 07:15    Pending Labs Unresulted Labs (From admission, onward)    Start     Ordered   08/13/18 0500  CBC  Tomorrow morning,   R     08/12/18 0857   08/13/18 0500  Renal function panel  Tomorrow morning,   R     08/12/18 0857   08/12/18 0915  Novel Coronavirus,NAA,(SEND-OUT TO REF LAB - TAT 24-48 hrs); Hosp Order  (Symptomatic Patients Labs with Precautions )  Once,   STAT    Question Answer Comment  Current symptoms Fever and Shortness of breath   Excluded other viral illnesses Yes   Exposure Risk None      08/12/18 0914   08/12/18 0843  Ammonia  Add-on,   AD     08/12/18 1324   08/12/18 0842  Hepatic function panel  Add-on,   AD     08/12/18 0841   08/12/18 4010  Lactic acid, plasma  Now then every 2 hours,   STAT     08/12/18 0701  08/12/18 0702  Blood culture (routine x 2)  BLOOD CULTURE X 2,   STAT     08/12/18 0701          Vitals/Pain Today's Vitals   08/12/18 0715 08/12/18 0742 08/12/18 0815 08/12/18 0830  BP: (!) 174/86  (!) 190/76 (!) 207/108  Pulse: 85  88 86  Resp:   16   Temp:  99.1 F (37.3 C)    TempSrc:  Rectal    SpO2: 93%  97% 95%  Weight:      Height:      PainSc:        Isolation Precautions Droplet and Contact precautions  Medications Medications  sodium chloride flush (NS) 0.9 %  injection 3 mL (has no administration in time range)  acetaminophen (TYLENOL) tablet 650 mg (has no administration in time range)    Or  acetaminophen (TYLENOL) suppository 650 mg (has no administration in time range)  hydrALAZINE (APRESOLINE) injection 10 mg (has no administration in time range)  Chlorhexidine Gluconate Cloth 2 % PADS 6 each (has no administration in time range)  albuterol (VENTOLIN HFA) 108 (90 Base) MCG/ACT inhaler 2 puff (has no administration in time range)  aspirin EC tablet 81 mg (has no administration in time range)  Metoprolol Tartrate TABS 75 mg (has no administration in time range)  hydrALAZINE (APRESOLINE) tablet 100 mg (has no administration in time range)  hydrALAZINE (APRESOLINE) tablet 25 mg (has no administration in time range)  pantoprazole (PROTONIX) EC tablet 40 mg (has no administration in time range)  sucroferric oxyhydroxide (VELPHORO) chewable tablet 500-1,500 mg (has no administration in time range)  clonazePAM (KLONOPIN) tablet 0.5 mg (has no administration in time range)  calcitRIOL (ROCALTROL) capsule 1 mcg (has no administration in time range)  mometasone-formoterol (DULERA) 200-5 MCG/ACT inhaler 2 puff (2 puffs Inhalation Not Given 08/12/18 0929)  guaiFENesin (MUCINEX) 12 hr tablet 1,200 mg (has no administration in time range)  pravastatin (PRAVACHOL) tablet 40 mg (has no administration in time range)  traMADol (ULTRAM) tablet 50 mg (has no administration in time range)  albuterol (VENTOLIN HFA) 108 (90 Base) MCG/ACT inhaler 8 puff (8 puffs Inhalation Given 08/12/18 0850)    Mobility walks Low fall risk   Focused Assessments Neuro Assessment Handoff:  Swallow screen pass? Yes  Cardiac Rhythm: Normal sinus rhythm NIH Stroke Scale ( + Modified Stroke Scale Criteria)  Interval: Initial Level of Consciousness (1a.)   : Alert, keenly responsive LOC Questions (1b. )   +: Answers both questions correctly LOC Commands (1c. )   + : Performs both  tasks correctly Best Gaze (2. )  +: Normal Visual (3. )  +: No visual loss Facial Palsy (4. )    : Normal symmetrical movements Motor Arm, Left (5a. )   +: No drift Motor Arm, Right (5b. )   +: No drift Motor Leg, Left (6a. )   +: No drift Motor Leg, Right (6b. )   +: No drift Limb Ataxia (7. ): Absent Sensory (8. )   +: Normal, no sensory loss Best Language (9. )   +: No aphasia Dysarthria (10. ): Normal Extinction/Inattention (11.)   +: No Abnormality Modified SS Total  +: 0 Complete NIHSS TOTAL: 0     Neuro Assessment: Exceptions to WDL Neuro Checks:   Initial (08/12/18 0600)  Last Documented NIHSS Modified Score: 0 (08/12/18 0600) Has TPA been given? no If patient is a Neuro Trauma and patient is going to OR before  floor call report to [place] nurse: [phone] or [phone]     R Recommendations: See Admitting Provider Note  Report given to:   Additional Notes:

## 2018-08-12 NOTE — ED Notes (Signed)
Renal Diet Tray Ordered @ 1531-per Deneise Lever, RN called by Levada Dy

## 2018-08-12 NOTE — Progress Notes (Signed)
Patient has OP HD seat at [facility] at 12:30pm tomorrow 08/13/18/arrive at 12:10pm-if send out COVID 19 test comes back negative prior to appointment and he is medically cleared for discharge. Renal Navigator notified Nephrologist/Dr. Jonnie Finner and will follow up on 08/13/18.  Alphonzo Cruise, [facility] Renal Navigator [phone]

## 2018-08-12 NOTE — ED Provider Notes (Signed)
Care transferred to me.  So far his work-up is fairly benign though he does have a significantly elevated creatinine and his BUN is moderately elevated.  Unclear if the BUN is what is causing his confusion.  When I talked to him he seems somewhat with it, knowing that his dialysis is for tomorrow and that he could not make it yesterday because his car broke down.  However he is also disoriented.  CT head is benign.  Chest x-ray shows some pulmonary edema which is likely contributing to his tachypnea noted at bedside.  He still seems off/altered and will need admission.  Nephrology consulted, Dr. Jonnie Finner. Dr. Tamala Julian will admit.  Results for orders placed or performed during the hospital encounter of 08/12/18  SARS Coronavirus 2 (CEPHEID- Performed in [place] hospital lab), [facility] Order   Specimen: Nasopharyngeal Swab  Result Value Ref Range   SARS Coronavirus 2 NEGATIVE NEGATIVE  Basic metabolic panel  Result Value Ref Range   Sodium 140 135 - 145 mmol/L   Potassium 4.3 3.5 - 5.1 mmol/L   Chloride 100 98 - 111 mmol/L   CO2 21 (L) 22 - 32 mmol/L   Glucose, Bld 143 (H) 70 - 99 mg/dL   BUN 67 (H) 6 - 20 mg/dL   Creatinine, Ser 19.59 (H) 0.61 - 1.24 mg/dL   Calcium 9.1 8.9 - 10.3 mg/dL   GFR calc non Af Amer 2 (L) >60 mL/min   GFR calc Af Amer 3 (L) >60 mL/min   Anion gap 19 (H) 5 - 15  CBC  Result Value Ref Range   WBC 7.2 4.0 - 10.5 K/uL   RBC 2.59 (L) 4.22 - 5.81 MIL/uL   Hemoglobin 8.2 (L) 13.0 - 17.0 g/dL   HCT 25.4 (L) 39.0 - 52.0 %   MCV 98.1 80.0 - 100.0 fL   MCH 31.7 26.0 - 34.0 pg   MCHC 32.3 30.0 - 36.0 g/dL   RDW 14.9 11.5 - 15.5 %   Platelets 80 (L) 150 - 400 K/uL   nRBC 0.0 0.0 - 0.2 %  CBG monitoring, ED  Result Value Ref Range   Glucose-Capillary 124 (H) 70 - 99 mg/dL  I-STAT 7, (LYTES, BLD GAS, ICA, H+H)  Result Value Ref Range   pH, Arterial 7.385 7.350 - 7.450   pCO2 arterial 34.7 32.0 - 48.0 mmHg   pO2, Arterial 61.0 (L) 83.0 - 108.0 mmHg   Bicarbonate 20.8  20.0 - 28.0 mmol/L   TCO2 22 22 - 32 mmol/L   O2 Saturation 91.0 %   Acid-base deficit 4.0 (H) 0.0 - 2.0 mmol/L   Sodium 139 135 - 145 mmol/L   Potassium 4.1 3.5 - 5.1 mmol/L   Calcium, Ion 1.12 (L) 1.15 - 1.40 mmol/L   HCT 38.0 (L) 39.0 - 52.0 %   Hemoglobin 12.9 (L) 13.0 - 17.0 g/dL   Patient temperature 98.6 F    Collection site RADIAL, ALLEN'S TEST ACCEPTABLE    Drawn by RT    Sample type ARTERIAL   Troponin I (High Sensitivity)  Result Value Ref Range   Troponin I (High Sensitivity) 75 (H) <18 ng/L   Ct Head Wo Contrast  Result Date: 08/12/2018 CLINICAL DATA:  Altered mental status, headache EXAM: CT HEAD WITHOUT CONTRAST TECHNIQUE: Contiguous axial images were obtained from the base of the skull through the vertex without intravenous contrast. COMPARISON:  05/01/2017 FINDINGS: Brain: No evidence of acute infarction, hemorrhage, hydrocephalus, extra-axial collection or mass lesion/mass effect. Mild chronic  low-density changes within the periventricular and subcortical white matter suggestive of chronic microvascular ischemic disease. Vascular: Scattered calcified atherosclerotic changes involving the large vessels at the skull base. No unexpected hyperdense vessel. Skull: Normal. Negative for fracture or focal lesion. Sinuses/Orbits: No acute finding. Other: None. IMPRESSION: No acute intracranial findings. Electronically Signed   By: Davina Poke M.D.   On: 08/12/2018 08:06   Dg Chest Portable 1 View  Result Date: 08/12/2018 CLINICAL DATA:  Shortness of breath. EXAM: PORTABLE CHEST 1 VIEW COMPARISON:  06/01/2018. FINDINGS: Cardiomegaly. Pulmonary venous congestion and bilateral interstitial prominence. No pleural effusion or pneumothorax. Prior cervical spine fusion. IMPRESSION: Cardiomegaly with pulmonary venous congestion and bilateral interstitial prominence suggesting CHF. Pneumonitis cannot be excluded. Electronically Signed   By: Marcello Moores  Register   On: 08/12/2018 07:15       Sherwood Gambler, MD 08/12/18 [phone]

## 2018-08-12 NOTE — ED Notes (Signed)
Report called to Ginger, 2W RN

## 2018-08-12 NOTE — ED Triage Notes (Signed)
Pt BIB [facility] for missing dialysis on Monday, headache, SOB, and self reported syncope. EMS advised all vital signs were normal for the patient. EMS reports Pt CAOx4, 12 lead unremarkable,

## 2018-08-13 DIAGNOSIS — R0602 Shortness of breath: Secondary | ICD-10-CM | POA: Diagnosis not present

## 2018-08-13 DIAGNOSIS — F419 Anxiety disorder, unspecified: Secondary | ICD-10-CM | POA: Diagnosis present

## 2018-08-13 DIAGNOSIS — K219 Gastro-esophageal reflux disease without esophagitis: Secondary | ICD-10-CM | POA: Diagnosis not present

## 2018-08-13 DIAGNOSIS — R4182 Altered mental status, unspecified: Secondary | ICD-10-CM | POA: Diagnosis not present

## 2018-08-13 DIAGNOSIS — R509 Fever, unspecified: Secondary | ICD-10-CM | POA: Diagnosis not present

## 2018-08-13 DIAGNOSIS — I120 Hypertensive chronic kidney disease with stage 5 chronic kidney disease or end stage renal disease: Secondary | ICD-10-CM | POA: Diagnosis not present

## 2018-08-13 DIAGNOSIS — J189 Pneumonia, unspecified organism: Secondary | ICD-10-CM | POA: Diagnosis not present

## 2018-08-13 DIAGNOSIS — G4733 Obstructive sleep apnea (adult) (pediatric): Secondary | ICD-10-CM | POA: Diagnosis present

## 2018-08-13 DIAGNOSIS — I132 Hypertensive heart and chronic kidney disease with heart failure and with stage 5 chronic kidney disease, or end stage renal disease: Secondary | ICD-10-CM | POA: Diagnosis not present

## 2018-08-13 DIAGNOSIS — J45909 Unspecified asthma, uncomplicated: Secondary | ICD-10-CM | POA: Diagnosis present

## 2018-08-13 DIAGNOSIS — R0600 Dyspnea, unspecified: Secondary | ICD-10-CM | POA: Diagnosis not present

## 2018-08-13 DIAGNOSIS — Z905 Acquired absence of kidney: Secondary | ICD-10-CM | POA: Diagnosis not present

## 2018-08-13 DIAGNOSIS — Z7951 Long term (current) use of inhaled steroids: Secondary | ICD-10-CM | POA: Diagnosis not present

## 2018-08-13 DIAGNOSIS — R0603 Acute respiratory distress: Secondary | ICD-10-CM | POA: Diagnosis not present

## 2018-08-13 DIAGNOSIS — D631 Anemia in chronic kidney disease: Secondary | ICD-10-CM | POA: Diagnosis not present

## 2018-08-13 DIAGNOSIS — I160 Hypertensive urgency: Secondary | ICD-10-CM | POA: Diagnosis present

## 2018-08-13 DIAGNOSIS — F17210 Nicotine dependence, cigarettes, uncomplicated: Secondary | ICD-10-CM | POA: Diagnosis present

## 2018-08-13 DIAGNOSIS — I5033 Acute on chronic diastolic (congestive) heart failure: Secondary | ICD-10-CM | POA: Diagnosis not present

## 2018-08-13 DIAGNOSIS — Z940 Kidney transplant status: Secondary | ICD-10-CM | POA: Diagnosis not present

## 2018-08-13 DIAGNOSIS — Z992 Dependence on renal dialysis: Secondary | ICD-10-CM | POA: Diagnosis not present

## 2018-08-13 DIAGNOSIS — Z79899 Other long term (current) drug therapy: Secondary | ICD-10-CM | POA: Diagnosis not present

## 2018-08-13 DIAGNOSIS — E559 Vitamin D deficiency, unspecified: Secondary | ICD-10-CM | POA: Diagnosis present

## 2018-08-13 DIAGNOSIS — Z7982 Long term (current) use of aspirin: Secondary | ICD-10-CM | POA: Diagnosis not present

## 2018-08-13 DIAGNOSIS — N2581 Secondary hyperparathyroidism of renal origin: Secondary | ICD-10-CM | POA: Diagnosis present

## 2018-08-13 DIAGNOSIS — Z20828 Contact with and (suspected) exposure to other viral communicable diseases: Secondary | ICD-10-CM | POA: Diagnosis not present

## 2018-08-13 DIAGNOSIS — G92 Toxic encephalopathy: Secondary | ICD-10-CM

## 2018-08-13 DIAGNOSIS — I10 Essential (primary) hypertension: Secondary | ICD-10-CM | POA: Diagnosis not present

## 2018-08-13 DIAGNOSIS — J9601 Acute respiratory failure with hypoxia: Secondary | ICD-10-CM

## 2018-08-13 DIAGNOSIS — N186 End stage renal disease: Secondary | ICD-10-CM | POA: Diagnosis present

## 2018-08-13 DIAGNOSIS — E785 Hyperlipidemia, unspecified: Secondary | ICD-10-CM | POA: Diagnosis present

## 2018-08-13 DIAGNOSIS — D696 Thrombocytopenia, unspecified: Secondary | ICD-10-CM | POA: Diagnosis not present

## 2018-08-13 DIAGNOSIS — I5032 Chronic diastolic (congestive) heart failure: Secondary | ICD-10-CM | POA: Diagnosis present

## 2018-08-13 LAB — RENAL FUNCTION PANEL
Albumin: 3.3 g/dL — ABNORMAL LOW (ref 3.5–5.0)
Anion gap: 16 — ABNORMAL HIGH (ref 5–15)
BUN: 46 mg/dL — ABNORMAL HIGH (ref 6–20)
CO2: 23 mmol/L (ref 22–32)
Calcium: 8.7 mg/dL — ABNORMAL LOW (ref 8.9–10.3)
Chloride: 96 mmol/L — ABNORMAL LOW (ref 98–111)
Creatinine, Ser: 13.97 mg/dL — ABNORMAL HIGH (ref 0.61–1.24)
GFR calc Af Amer: 4 mL/min — ABNORMAL LOW (ref >60)
GFR calc non Af Amer: 3 mL/min — ABNORMAL LOW (ref >60)
Glucose, Bld: 121 mg/dL — ABNORMAL HIGH (ref 70–99)
Phosphorus: 6.6 mg/dL — ABNORMAL HIGH (ref 2.5–4.6)
Potassium: 3.6 mmol/L (ref 3.5–5.1)
Sodium: 135 mmol/L (ref 135–145)

## 2018-08-13 LAB — CBC
HCT: 30.1 % — ABNORMAL LOW (ref 39.0–52.0)
Hemoglobin: 9.8 g/dL — ABNORMAL LOW (ref 13.0–17.0)
MCH: 31.9 pg (ref 26.0–34.0)
MCHC: 32.6 g/dL (ref 30.0–36.0)
MCV: 98 fL (ref 80.0–100.0)
Platelets: 68 10*3/uL — ABNORMAL LOW (ref 150–400)
RBC: 3.07 MIL/uL — ABNORMAL LOW (ref 4.22–5.81)
RDW: 15.2 % (ref 11.5–15.5)
WBC: 6.5 10*3/uL (ref 4.0–10.5)
nRBC: 0 % (ref 0.0–0.2)

## 2018-08-13 LAB — NOVEL CORONAVIRUS, NAA (HOSP ORDER, SEND-OUT TO REF LAB; TAT 18-24 HRS): SARS-CoV-2, NAA: NOT DETECTED

## 2018-08-13 MED ORDER — SODIUM CHLORIDE 0.9 % IV SOLN: 100.0000 mL | INTRAVENOUS | Status: DC | PRN

## 2018-08-13 MED ORDER — HEPARIN SODIUM (PORCINE) 1000 UNIT/ML DIALYSIS: 3000.0000 [IU] | Freq: Once | INTRAMUSCULAR | Status: DC

## 2018-08-13 MED ORDER — ALTEPLASE 2 MG IJ SOLR: 2.0000 mg | Freq: Once | INTRAMUSCULAR | Status: DC | PRN

## 2018-08-13 MED ORDER — HEPARIN SODIUM (PORCINE) 1000 UNIT/ML DIALYSIS
1000.0000 [IU] | INTRAMUSCULAR | Status: DC | PRN
  Filled 2018-08-13: qty 1

## 2018-08-13 MED ORDER — LIDOCAINE-PRILOCAINE 2.5-2.5 % EX CREA: 1.0000 "application " | TOPICAL_CREAM | CUTANEOUS | Status: DC | PRN

## 2018-08-13 MED ORDER — LIDOCAINE HCL (PF) 1 % IJ SOLN: 5.0000 mL | INTRAMUSCULAR | Status: DC | PRN

## 2018-08-13 MED ORDER — PENTAFLUOROPROP-TETRAFLUOROETH EX AERO: 1.0000 "application " | INHALATION_SPRAY | CUTANEOUS | Status: DC | PRN

## 2018-08-13 MED ORDER — ONDANSETRON HCL 4 MG/2ML IJ SOLN
4.0000 mg | Freq: Four times a day (QID) | INTRAMUSCULAR | Status: DC | PRN
  Administered 2018-08-13: 4 mg via INTRAVENOUS
  Filled 2018-08-13: qty 2

## 2018-08-13 NOTE — Progress Notes (Signed)
Renal Navigator notified OP HD clinic/NW of patient's negative rapid and send out COVID 19 test results to provide continuity of care and safety.  Alphonzo Cruise, [place] Renal Navigator [phone]

## 2018-08-13 NOTE — Progress Notes (Addendum)
PROGRESS NOTE                                                                                                                                                                                                             Patient Demographics:    Kirk [name], is a 58 y.o. male, DOB - 12-Jul-1960, WJX:[id number]  Admit date - 08/12/2018   Admitting Physician Norval Morton, MD  Outpatient Primary MD for the patient is Gayland Curry, DO  LOS - 0  Outpatient Specialists: Nephrology  Chief Complaint  Patient presents with  . Shortness of Breath  . Headache  . Loss of Consciousness       Brief Narrative 58 year old male with history of ESRD on hemodialysis (M, W, F), hypertension, diastolic CHF, asthma, partial seizures, GERD, OSA on CPAP, anemia of chronic disease presented to the ED with shortness of breath.  Reportedly missed his last hemodialysis on the day prior to admission.  Reported feeling dizzy and felt like passing out.  Also complained of cough with headache, generalized body aches, intermittent fevers off and on for 1 week. EMS found patient to be confused (wife reported that patient occasionally gets confused after hemodialysis but was more than normal this week).  No witnessed seizure activity. In the ED he had low-grade fever of 9 9.1 F, blood pressure elevated 174-207/80 6-108 mmHg. Blood work showed normal WBC and hemoglobin, platelets of 80, BUN of 67 and creatinine of 19.5, lactic acid and troponin were normal.  COVID-19 screening was negative.  Chest x-ray showed cardiomegaly with pulmonary edema.  Head CT unremarkable.  Blood cultures ordered and nephrology consulted.    Subjective:   Patient still complaining of nonproductive cough and shortness of breath.  Noted for O2 sat in mid to high 80s on room air.  Assessment  & Plan :   Principal problem Acute toxic-metabolic encephalopathy ([place]) Reported  confusion with intermittent fevers.  No focal weakness.  Head CT negative and COVID-19 was tested negative x2. Hypertensive versus metabolic encephalopathy with uremia.  Cannot rule out underlying infection.  Blood cultures sent on admission.  Will follow.  Patient afebrile today.  Maintain on seizure precaution.   Continue neurochecks.  Active problems  acute respiratory failure with hypoxia (HCC) Secondary to acute pulmonary edema versus acute pneumonitis.  Noted for hypoxemia on  ABG.  Sats still in the 80s on room air.  Continue O2 via nasal cannula (2 L) along with albuterol inhaler and PRN nebs. Monitor respiratory status with dialysis. COVID-19 tested negative.  ESRD on HD (M, W, F) Missed his scheduled dialysis on 7/20.  Volume overloaded on exam.  Getting dialyzed this afternoon.  Hypertensive urgency Home meds including hydralazine and metoprolol have been resumed.  Monitor for blood pressure control postdialysis.  May need to adjust medications if blood pressure still elevated.  Thrombocytopenia Acute on chronic.  Suspect secondary to acute viral illness.  Platelets further dropped to 68 this morning.  Hemoglobin stable.  Recheck in a.m.  Monitor for bleeding.  Hyperlipidemia Continue statin  Anxiety Continue PRN Klonopin  OSA on nighttime CPAP   Patient status: Inpatient Patient presenting with acute respiratory failure with hypoxia associated with cough and acute toxic-metabolic encephalopathy and thrombocytopenia.  Patient still requiring 2 L via nasal cannula and needs to be monitored for worsening symptoms, he is at high risk of development of sepsis and worsening thrombocytopenia +/-encephalopathy.  For this reason patient needs to be monitored in an inpatient setting for >2 midnight.  Code Status : Full code  Family Communication  : None at best  Disposition Plan  : Home in a.m. if respiratory symptoms, encephalopathy and thrombocytopenia improve and remains  afebrile  Barriers For Discharge : Active symptoms  Consults  : Renal  Procedures  : CT head  DVT Prophylaxis  : SCDs  Lab Results  Component Value Date   PLT 68 (L) 08/13/2018    Antibiotics  :    Anti-infectives (From admission, onward)   None        Objective:   Vitals:   08/13/18 0755 08/13/18 0854 08/13/18 1000 08/13/18 1257  BP: (!) 181/77   (!) 171/82  Pulse: (!) 101 (!) 104  84  Resp:    19  Temp:   99.6 F (37.6 C) 98.8 F (37.1 C)  TempSrc:   Oral Oral  SpO2: (!) 89% 92% 94% 96%  Weight:      Height:        Wt Readings from Last 3 Encounters:  08/13/18 97.7 kg  07/08/18 101.1 kg  06/02/18 98.6 kg     Intake/Output Summary (Last 24 hours) at 08/13/2018 1425 Last data filed at 08/13/2018 1300 Gross per 24 hour  Intake 400 ml  Output 2500 ml  Net -2100 ml     Physical Exam  Gen: not in distress HEENT: moist mucosa, supple neck Chest: Diminished bilateral breath sounds CVS: N S1&S2, no murmurs, GI: soft, NT, ND,  Musculoskeletal: warm, no edema     Data Review:    CBC Recent Labs  Lab 08/12/18 0626 08/12/18 0745 08/13/18 0613  WBC 7.2  --  6.5  HGB 8.2* 12.9* 9.8*  HCT 25.4* 38.0* 30.1*  PLT 80*  --  68*  MCV 98.1  --  98.0  MCH 31.7  --  31.9  MCHC 32.3  --  32.6  RDW 14.9  --  15.2    Chemistries  Recent Labs  Lab 08/12/18 0626 08/12/18 0745 08/12/18 1644 08/13/18 0613  NA 140 139  --  135  K 4.3 4.1  --  3.6  CL 100  --   --  96*  CO2 21*  --   --  23  GLUCOSE 143*  --   --  121*  BUN 67*  --   --  46*  CREATININE 19.59*  --   --  13.97*  CALCIUM 9.1  --   --  8.7*  AST  --   --  27  --   ALT  --   --  35  --   ALKPHOS  --   --  87  --   BILITOT  --   --  0.8  --    ------------------------------------------------------------------------------------------------------------------ No results for input(s): CHOL, HDL, LDLCALC, TRIG, CHOLHDL, LDLDIRECT in the last 72 hours.  Lab Results  Component Value Date    HGBA1C 5.1 01/28/2018   ------------------------------------------------------------------------------------------------------------------ No results for input(s): TSH, T4TOTAL, T3FREE, THYROIDAB in the last 72 hours.  Invalid input(s): FREET3 ------------------------------------------------------------------------------------------------------------------ No results for input(s): VITAMINB12, FOLATE, FERRITIN, TIBC, IRON, RETICCTPCT in the last 72 hours.  Coagulation profile No results for input(s): INR, PROTIME in the last 168 hours.  No results for input(s): DDIMER in the last 72 hours.  Cardiac Enzymes No results for input(s): CKMB, TROPONINI, MYOGLOBIN in the last 168 hours.  Invalid input(s): CK ------------------------------------------------------------------------------------------------------------------    Component Value Date/Time   BNP 1,092.0 (H) 05/14/2018 1453    Inpatient Medications  Scheduled Meds: . aspirin EC  81 mg Oral Daily  . calcitRIOL  1 mcg Oral Q M,W,F-HD  . Chlorhexidine Gluconate Cloth  6 each Topical Q0600  . Chlorhexidine Gluconate Cloth  6 each Topical Q0600  . guaiFENesin  1,200 mg Oral BID  . hydrALAZINE  100 mg Oral BID  . hydrALAZINE  25 mg Oral Q6H  . metoprolol tartrate  75 mg Oral BID  . mometasone-formoterol  2 puff Inhalation BID  . pantoprazole  40 mg Oral Daily  . pravastatin  40 mg Oral QHS  . sodium chloride flush  3 mL Intravenous Q12H  . sucroferric oxyhydroxide  1,500 mg Oral TID WC  . sucroferric oxyhydroxide  500 mg Oral With snacks   Continuous Infusions: PRN Meds:.acetaminophen **OR** acetaminophen, albuterol, clonazePAM, guaiFENesin-dextromethorphan, hydrALAZINE, ondansetron, traMADol  Micro Results Recent Results (from the past 240 hour(s))  SARS Coronavirus 2 (CEPHEID- Performed in [facility] hospital lab), Hosp Order     Status: None   Collection Time: 08/12/18  6:27 AM   Specimen: Nasopharyngeal Swab   Result Value Ref Range Status   SARS Coronavirus 2 NEGATIVE NEGATIVE Final    Comment: (NOTE) If result is NEGATIVE SARS-CoV-2 target nucleic acids are NOT DETECTED. The SARS-CoV-2 RNA is generally detectable in upper and lower  respiratory specimens during the acute phase of infection. The lowest  concentration of SARS-CoV-2 viral copies this assay can detect is 250  copies / mL. A negative result does not preclude SARS-CoV-2 infection  and should not be used as the sole basis for treatment or other  patient management decisions.  A negative result may occur with  improper specimen collection / handling, submission of specimen other  than nasopharyngeal swab, presence of viral mutation(s) within the  areas targeted by this assay, and inadequate number of viral copies  (<250 copies / mL). A negative result must be combined with clinical  observations, patient history, and epidemiological information. If result is POSITIVE SARS-CoV-2 target nucleic acids are DETECTED. The SARS-CoV-2 RNA is generally detectable in upper and lower  respiratory specimens dur ing the acute phase of infection.  Positive  results are indicative of active infection with SARS-CoV-2.  Clinical  correlation with patient history and other diagnostic information is  necessary to determine patient infection status.  Positive results do  not rule  out bacterial infection or co-infection with other viruses. If result is PRESUMPTIVE POSTIVE SARS-CoV-2 nucleic acids MAY BE PRESENT.   A presumptive positive result was obtained on the submitted specimen  and confirmed on repeat testing.  While 2019 novel coronavirus  (SARS-CoV-2) nucleic acids may be present in the submitted sample  additional confirmatory testing may be necessary for epidemiological  and / or clinical management purposes  to differentiate between  SARS-CoV-2 and other Sarbecovirus currently known to infect humans.  If clinically indicated additional  testing with an alternate test  methodology [phone]) is advised. The SARS-CoV-2 RNA is generally  detectable in upper and lower respiratory sp ecimens during the acute  phase of infection. The expected result is Negative. Fact Sheet for Patients:  StrictlyIdeas.no Fact Sheet for Healthcare Providers: BankingDealers.co.za This test is not yet approved or cleared by the [place] FDA and has been authorized for detection and/or diagnosis of SARS-CoV-2 by FDA under an Emergency Use Authorization (EUA).  This EUA will remain in effect (meaning this test can be used) for the duration of the COVID-19 declaration under Section 564(b)(1) of the Act, 21 U.S.C. section 360bbb-3(b)(1), unless the authorization is terminated or revoked sooner. Performed at [facility], [place] [address] [place], [place] [address]   Blood culture (routine x 2)     Status: None (Preliminary result)   Collection Time: 08/12/18  7:50 AM   Specimen: BLOOD RIGHT ARM  Result Value Ref Range Status   Specimen Description BLOOD RIGHT ARM  Final   Special Requests   Final    BOTTLES DRAWN AEROBIC AND ANAEROBIC Blood Culture adequate volume   Culture   Final    NO GROWTH 1 DAY Performed at [facility], [place] [address], [place], [place] [address]    Report Status PENDING  Incomplete  Blood culture (routine x 2)     Status: None (Preliminary result)   Collection Time: 08/12/18  8:05 AM   Specimen: BLOOD RIGHT ARM  Result Value Ref Range Status   Specimen Description BLOOD RIGHT ARM  Final   Special Requests   Final    BOTTLES DRAWN AEROBIC AND ANAEROBIC Blood Culture results may not be optimal due to an excessive volume of blood received in culture bottles   Culture   Final    NO GROWTH 1 DAY Performed at [facility], [place] [address], [place], [place] [address]    Report Status PENDING  Incomplete  Novel Coronavirus,NAA,(SEND-OUT TO REF LAB - TAT  24-48 hrs); Hosp Order     Status: None   Collection Time: 08/12/18 12:00 PM   Specimen: Nasopharyngeal Swab; Respiratory  Result Value Ref Range Status   SARS-CoV-2, NAA NOT DETECTED NOT DETECTED Final    Comment: (NOTE) This test was developed and its performance characteristics determined by [employer]. This test has not been FDA cleared or approved. This test has been authorized by FDA under an Emergency Use Authorization (EUA). This test is only authorized for the duration of time the declaration that circumstances exist justifying the authorization of the emergency use of in vitro diagnostic tests for detection of SARS-CoV-2 virus and/or diagnosis of COVID-19 infection under section 564(b)(1) of the Act, 21 U.S.C. 809XIP-3(A)(2), unless the authorization is terminated or revoked sooner. When diagnostic testing is negative, the possibility of a false negative result should be considered in the context of a patient's recent exposures and the presence of clinical signs and symptoms consistent with COVID-19.  An individual without symptoms of COVID-19 and who is not shedding SARS-CoV-2 virus would expect to have a negative (not detected) result in this assay. Performed  At: [facility] [address] [place], [place] [id number] Rush Farmer MD HE:[id number]    [place]  Final    Comment: Performed at [facility], [place] [address], [place], [place] [address]    Radiology Reports Ct Head Wo Contrast  Result Date: 08/12/2018 CLINICAL DATA:  Altered mental status, headache EXAM: CT HEAD WITHOUT CONTRAST TECHNIQUE: Contiguous axial images were obtained from the base of the skull through the vertex without intravenous contrast. COMPARISON:  05/01/2017 FINDINGS: Brain: No evidence of acute infarction, hemorrhage, hydrocephalus, extra-axial collection or mass lesion/mass effect. Mild chronic low-density changes within the periventricular  and subcortical white matter suggestive of chronic microvascular ischemic disease. Vascular: Scattered calcified atherosclerotic changes involving the large vessels at the skull base. No unexpected hyperdense vessel. Skull: Normal. Negative for fracture or focal lesion. Sinuses/Orbits: No acute finding. Other: None. IMPRESSION: No acute intracranial findings. Electronically Signed   By: Davina Poke M.D.   On: 08/12/2018 08:06   Dg Chest Portable 1 View  Result Date: 08/12/2018 CLINICAL DATA:  Shortness of breath. EXAM: PORTABLE CHEST 1 VIEW COMPARISON:  06/01/2018. FINDINGS: Cardiomegaly. Pulmonary venous congestion and bilateral interstitial prominence. No pleural effusion or pneumothorax. Prior cervical spine fusion. IMPRESSION: Cardiomegaly with pulmonary venous congestion and bilateral interstitial prominence suggesting CHF. Pneumonitis cannot be excluded. Electronically Signed   By: Marcello Moores  Register   On: 08/12/2018 07:15    Time Spent in minutes  25   Dequante Tremaine M.D on 08/13/2018 at 2:25 PM  Between 7am to 7pm - Pager - [phone]  After 7pm go to [email or web address] - password [facility]  [facility] -  Office  [phone]

## 2018-08-13 NOTE — Progress Notes (Signed)
[facility] Progress Note  Subjective: no new c/o's, 2.5 L off onHD yest w/o BP drops. Per [facility] pt doing better today, no fevers or AMS   Vitals:   08/13/18 0754 08/13/18 0755 08/13/18 0854 08/13/18 1000  BP: (!) 188/83 (!) 181/77    Pulse: (!) 102 (!) 101 (!) 104   Resp: 18     Temp: 99.7 F (37.6 C)   99.6 F (37.6 C)  TempSrc: Oral   Oral  SpO2: 90% (!) 89% 92% 94%  Weight:      Height:        Inpatient medications: . aspirin EC  81 mg Oral Daily  . calcitRIOL  1 mcg Oral Q M,W,F-HD  . Chlorhexidine Gluconate Cloth  6 each Topical Q0600  . Chlorhexidine Gluconate Cloth  6 each Topical Q0600  . guaiFENesin  1,200 mg Oral BID  . hydrALAZINE  100 mg Oral BID  . hydrALAZINE  25 mg Oral Q6H  . metoprolol tartrate  75 mg Oral BID  . mometasone-formoterol  2 puff Inhalation BID  . pantoprazole  40 mg Oral Daily  . pravastatin  40 mg Oral QHS  . sodium chloride flush  3 mL Intravenous Q12H  . sucroferric oxyhydroxide  1,500 mg Oral TID WC  . sucroferric oxyhydroxide  500 mg Oral With snacks    acetaminophen **OR** acetaminophen, albuterol, clonazePAM, guaiFENesin-dextromethorphan, hydrALAZINE, ondansetron, traMADol    Exam:  Patient not examined directly given COVID-19 + status, utilizing exam of the primary team and observations of RN's.     Home meds:  - aspirin 81/ pravastatin 40 hs  - hydralazine 25 tid/ metoprolol 75 bid  - tramadol 50 qid prn/ clonazepam 0.5 bid prn  - budesonide-formoterol 160-4.5 2 bid  - omeprazole 40 / sucroferric oxyhydroxide ac tid    Outpt HD: [facility]WF [facility]  4h 400/800  96.5kg  2K/3.5Ca bath TDC  Hep 3000 - venofer 50 /wk    Assessment/ Plan: 1. Dyspnea/ SOB: pulm edema on CXR, vol ^ per exam, +missed HD. Suspect volume main issue. 2.5 L off yest. Plan HD today w/ another 2.5- 3.5 L off, lower edw as tol.   2. ESRD: on HD MWF, missed Monday. HD again today on sched.  3. HTN - cont meds, get vol down 4. OSA -  on CPAP 5. Anemia ckd - get records 6. MBD ckd - cont meds   [facility] 08/13/2018, 11:27 AM  Iron/TIBC/Ferritin/ %Sat    Component Value Date/Time   IRON 31 (L) 01/28/2018 0127   TIBC 197 (L) 01/28/2018 0127   FERRITIN 755 (H) 01/28/2018 0127   IRONPCTSAT 16 (L) 01/28/2018 0127   Recent Labs  Lab 08/13/18 0613  NA 135  K 3.6  CL 96*  CO2 23  GLUCOSE 121*  BUN 46*  CREATININE 13.97*  CALCIUM 8.7*  PHOS 6.6*  ALBUMIN 3.3*   Recent Labs  Lab 08/12/18 1644  AST 27  ALT 35  ALKPHOS 87  BILITOT 0.8  PROT 6.8   Recent Labs  Lab 08/13/18 0613  WBC 6.5  HGB 9.8*  HCT 30.1*  PLT 68*

## 2018-08-14 DIAGNOSIS — R509 Fever, unspecified: Secondary | ICD-10-CM

## 2018-08-14 DIAGNOSIS — R4182 Altered mental status, unspecified: Secondary | ICD-10-CM

## 2018-08-14 LAB — CBC
HCT: 30.1 % — ABNORMAL LOW (ref 39.0–52.0)
Hemoglobin: 9.8 g/dL — ABNORMAL LOW (ref 13.0–17.0)
MCH: 31.5 pg (ref 26.0–34.0)
MCHC: 32.6 g/dL (ref 30.0–36.0)
MCV: 96.8 fL (ref 80.0–100.0)
Platelets: 77 10*3/uL — ABNORMAL LOW (ref 150–400)
RBC: 3.11 MIL/uL — ABNORMAL LOW (ref 4.22–5.81)
RDW: 14.8 % (ref 11.5–15.5)
WBC: 5.1 10*3/uL (ref 4.0–10.5)
nRBC: 0 % (ref 0.0–0.2)

## 2018-08-14 MED ORDER — METOPROLOL TARTRATE 100 MG PO TABS
100.0000 mg | ORAL_TABLET | Freq: Two times a day (BID) | ORAL | Status: DC
  Administered 2018-08-14: 100 mg via ORAL
  Filled 2018-08-14: qty 1

## 2018-08-14 MED ORDER — HYDRALAZINE HCL 50 MG PO TABS: 100.0000 mg | ORAL_TABLET | Freq: Three times a day (TID) | ORAL | Status: DC

## 2018-08-14 MED ORDER — METOPROLOL TARTRATE 100 MG PO TABS: 100.0000 mg | ORAL_TABLET | Freq: Two times a day (BID) | ORAL | 2 refills | Status: DC

## 2018-08-14 MED ORDER — HYDRALAZINE HCL 100 MG PO TABS: 100.0000 mg | ORAL_TABLET | Freq: Three times a day (TID) | ORAL | 0 refills | Status: DC

## 2018-08-14 NOTE — Progress Notes (Signed)
[facility] Progress Note  Subjective: 3L off yest, wt's down 96kg  Vitals:   08/13/18 2252 08/14/18 0243 08/14/18 0737 08/14/18 0743  BP: (!) 168/74   (!) 152/72  Pulse: 86  91 79  Resp: 20  18 17   Temp: 98.6 F (37 C)   98.5 F (36.9 C)  TempSrc:    Oral  SpO2: 94%  96% 99%  Weight:  96.3 kg    Height:        Inpatient medications: . aspirin EC  81 mg Oral Daily  . calcitRIOL  1 mcg Oral Q M,W,F-HD  . Chlorhexidine Gluconate Cloth  6 each Topical Q0600  . Chlorhexidine Gluconate Cloth  6 each Topical Q0600  . guaiFENesin  1,200 mg Oral BID  . hydrALAZINE  100 mg Oral Q8H  . metoprolol tartrate  100 mg Oral BID  . mometasone-formoterol  2 puff Inhalation BID  . pantoprazole  40 mg Oral Daily  . pravastatin  40 mg Oral QHS  . sodium chloride flush  3 mL Intravenous Q12H  . sucroferric oxyhydroxide  1,500 mg Oral TID WC  . sucroferric oxyhydroxide  500 mg Oral With snacks    acetaminophen **OR** acetaminophen, albuterol, clonazePAM, guaiFENesin-dextromethorphan, hydrALAZINE, ondansetron, traMADol    Exam:  Patient not examined directly given COVID-19 + status, utilizing exam of the primary team and observations of RN's.     Home meds:  - aspirin 81/ pravastatin 40 hs  - hydralazine 25 tid/ metoprolol 75 bid  - tramadol 50 qid prn/ clonazepam 0.5 bid prn  - budesonide-formoterol 160-4.5 2 bid  - omeprazole 40 / sucroferric oxyhydroxide ac tid    Outpt HD: [facility]  4h 400/800  96.5kg  2K/3.5Ca bath TDC  Hep 3000 - venofer 50 /wk    Assessment/ Plan: 1. Dyspnea/ SOB: pulm edema on CXR, vol ^ per exam, +missed HD. Had HD here x 2 w/ total 5.5 L off and is discharging today at dry wt 96.5kg 2. ESRD: on HD MWF, missed Monday. Going home today 3. HTN - cont meds, get vol down 4. OSA - on CPAP 5. Anemia ckd - get records 6. MBD ckd - cont meds   [facility] 08/14/2018, 12:01 PM  Iron/TIBC/Ferritin/ %Sat     Component Value Date/Time   IRON 31 (L) 01/28/2018 0127   TIBC 197 (L) 01/28/2018 0127   FERRITIN 755 (H) 01/28/2018 0127   IRONPCTSAT 16 (L) 01/28/2018 0127   Recent Labs  Lab 08/13/18 0613  NA 135  K 3.6  CL 96*  CO2 23  GLUCOSE 121*  BUN 46*  CREATININE 13.97*  CALCIUM 8.7*  PHOS 6.6*  ALBUMIN 3.3*   Recent Labs  Lab 08/12/18 1644  AST 27  ALT 35  ALKPHOS 87  BILITOT 0.8  PROT 6.8   Recent Labs  Lab 08/14/18 0351  WBC 5.1  HGB 9.8*  HCT 30.1*  PLT 77*

## 2018-08-14 NOTE — TOC Initial Note (Signed)
Transition of Care [facility]) - Initial/Assessment Note    Patient Details  Name: Kirk [name] MRN: [id number] Date of Birth: Dec 12, 1960  Transition of Care [facility]) CM/SW Contact:    Zenon Mayo, RN Phone Number: 08/14/2018, 10:18 AM  Clinical Narrative:                 From home with wife, he has no issues with getting medications. He has a PCP. His wife will be transporting him home today.  NCM talked to him about using less sodium in food , need to use fresh veges instead of can veges, and baked food.   Expected Discharge Plan: Home/Self Care Barriers to Discharge: No Barriers Identified   Patient Goals and CMS Choice Patient states their goals for this hospitalization and ongoing recovery are:: loose weight and get blood pressure down   Choice offered to / list presented to : NA  Expected Discharge Plan and Services Expected Discharge Plan: Home/Self Care In-house Referral: NA Discharge Planning Services: CM Consult Post Acute Care Choice: NA Living arrangements for the past 2 months: Single Family Home Expected Discharge Date: 08/14/18               DME Arranged: (NA)         HH Arranged: NA          Prior Living Arrangements/Services Living arrangements for the past 2 months: Single Family Home Lives with:: Spouse Patient language and need for interpreter reviewed:: Yes Do you feel safe going back to the place where you live?: Yes      Need for Family Participation in Patient Care: No (Comment) Care giver support system in place?: Yes (comment)   Criminal Activity/Legal Involvement Pertinent to Current Situation/Hospitalization: No - Comment as needed  Activities of Daily Living Home Assistive Devices/Equipment: CPAP ADL Screening (condition at time of admission) Patient's cognitive ability adequate to safely complete daily activities?: Yes Is the patient deaf or have difficulty hearing?: No Does the patient have difficulty seeing, even when wearing  glasses/contacts?: No Does the patient have difficulty concentrating, remembering, or making decisions?: No Patient able to express need for assistance with ADLs?: Yes Does the patient have difficulty dressing or bathing?: No Independently performs ADLs?: Yes (appropriate for developmental age) Does the patient have difficulty walking or climbing stairs?: No Weakness of Legs: None Weakness of Arms/Hands: None  Permission Sought/Granted                  Emotional Assessment Appearance:: Appears stated age Attitude/Demeanor/Rapport: Gracious Affect (typically observed): Appropriate Orientation: : Oriented to Self, Oriented to Place, Oriented to  Time, Oriented to Situation Alcohol / Substance Use: Tobacco Use Psych Involvement: No (comment)  Admission diagnosis:  ESRD (end stage renal disease) ([place]) [N18.6] Altered mental status, unspecified altered mental status type [R41.82] Acute encephalopathy [G93.40] Patient Active Problem List   Diagnosis Date Noted  . Altered mental status   . Toxic metabolic encephalopathy 95/62/1308  . Asthma with exacerbation 06/03/2018  . SOB (shortness of breath) 06/02/2018  . Right knee pain 06/02/2018  . Right tibial fracture 06/02/2018  . Asthma   . Elevated troponin 01/28/2018  . Cough 01/27/2018  . Cigarette smoker 07/11/2017  . ESRD (end stage renal disease) ([place]) 05/30/2017  . Anxiety 05/30/2017  . HCAP (healthcare-associated pneumonia) 05/30/2017  . Hypokalemia   . Acute respiratory failure with hypoxia ([place]) 01/28/2017  . 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  . Acute respiratory distress 10/31/2016  . Shunt malfunction 07/03/2016  .  End stage renal disease ([place])   . Need for hepatitis C screening test 06/29/2015  . Fatigue 06/29/2015  . Fever 05/17/2015  . Myalgia 05/17/2015  . Acute dyspnea   . 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 Obesity hypoventilation syndrome ([place]) 12/09/2014  . Extrinsic asthma 12/09/2014  . Narcotic  drug use 12/09/2014  . Hypersomnia with sleep apnea 12/09/2014  . SCC (squamous cell carcinoma), arm 11/09/2014  . Mild persistent chronic asthma without complication 19/41/7408  . ESRD on dialysis ([place]) 09/09/2014  . HLD (hyperlipidemia) 09/09/2014  . Chest pain 09/09/2014  . DOE (dyspnea on exertion) 06/23/2014  . Pancytopenia ([place]) 04/28/2014  . Thrombocytopenia ([place]) 04/10/2014  . Fungal dermatitis 03/17/2014  . Abdominal pain   . History of surgical procedure 09/17/2013  . Awaiting organ transplant 02/13/2013  . Uncontrolled hypertension   . Tension headache   . Memory loss   . Anemia in chronic renal disease   . Edema   . Thoracic or lumbosacral neuritis or radiculitis 03/27/2012  . Nerve root pain 03/27/2012  . History of kidney transplant 09/10/2011  . Hearing loss 08/16/2011  . Obesity 08/16/2011  . Difficulty hearing 08/16/2011  . OSA on CPAP 08/05/2011  . GIB (gastrointestinal bleeding) 10/28/2007   PCP:  Gayland Curry, DO Pharmacy:   [facility] [address] [place] [address] Phone: [phone] Fax: [phone]  [id number] - [place], [address] AT [address] [place] [address] Phone: [phone] Fax: [phone]     Social Determinants of Health (SDOH) Interventions    Readmission Risk Interventions Readmission Risk Prevention Plan 08/14/2018 06/03/2018  Transportation Screening Complete Complete  Medication Review Press photographer) Complete Complete  PCP or Specialist appointment within 3-5 days of discharge Complete Complete  HRI or [place] Complete Complete  SW Recovery Care/Counseling Consult Complete Complete  Palliative Care Screening Not Applicable Not [place] Not Applicable Not Applicable  Some recent data might be hidden

## 2018-08-14 NOTE — Discharge Summary (Addendum)
Physician Discharge Summary  KAIDAN HARPSTER TFT:[id number] DOB: 1960/02/28 DOA: 08/12/2018  PCP: Gayland Curry, DO  Admit date: 08/12/2018 Discharge date: 08/14/2018  Admitted From: Home Disposition: Home  Recommendations for Outpatient Follow-up:  1. Follow up with PCP in 1-2 weeks.  Please add another antihypertensive if blood pressure still elevated.  Please check platelets during outpatient follow-up.  Home Health: None Equipment/Devices: None  Discharge Condition: Fair CODE STATUS: Full code Diet recommendation: Heart Healthy     Discharge Diagnoses:  Principal Problem:   Acute respiratory failure with hypoxia (HCC)  Active Problems:   OSA on CPAP   HTN (hypertension)   Anemia in chronic renal disease   Thrombocytopenia (HCC)   Pancytopenia (HCC)   ESRD on dialysis ([place])   Acute respiratory distress   Elevated troponin   Toxic metabolic encephalopathy  Brief narrative/HPI Please refer to admission H&P for details, in brief,  Hospital course 58 year old male with history of ESRD on hemodialysis (M, W, F), hypertension, diastolic CHF, asthma, partial seizures, GERD, OSA on CPAP, anemia of chronic disease presented to the ED with shortness of breath.  Reportedly missed his last hemodialysis on the day prior to admission.  Reported feeling dizzy and felt like passing out.  Also complained of cough with headache, generalized body aches, intermittent fevers off and on for 1 week. EMS found patient to be confused (wife reported that patient occasionally gets confused after hemodialysis but was more than normal this week).  No witnessed seizure activity. In the ED he had low-grade fever of 9 9.1 F, blood pressure elevated 174-207/80 6-108 mmHg. Blood work showed normal WBC and hemoglobin, platelets of 80, BUN of 67 and creatinine of 19.5, lactic acid and troponin were normal.  COVID-19 screening was negative.  Chest x-ray showed cardiomegaly with pulmonary edema.  Head CT  unremarkable.  Blood cultures ordered and nephrology consulted.  Hospital course  Principal problem Acute toxic-metabolic encephalopathy ([place]) Reported confusion with intermittent fevers.  No focal weakness.  Head CT negative and COVID-19 was tested negative x2. Hypertensive versus metabolic encephalopathy with uremia.   Blood cultures on admission negative for growth.  Has remained afebrile.  No seizure activity noted.  He possibly could have had an acute viral illness.  Active problems  acute respiratory failure with hypoxia (HCC) Secondary to acute pulmonary edema with acute on chronic diastolic chf  versus acute pneumonitis.  Noted for hypoxemia on ABG.  Sats still in the 80s on room air.  Continue O2 via nasal cannula (2 L) along with albuterol inhaler and PRN nebs. Monitor respiratory status with dialysis. COVID-19 tested negative.   Hypertensive urgency Home meds including hydralazine and metoprolol were resumed.  Patient is on hydralazine 100 mg twice daily along with 25 mg every 8 hours at home and I suspect he is not adherent to such regimen.  He is also on metoprolol 75 mg twice daily.  His blood pressure did improve with hemodialysis but not at goal.  I have changed his hydralazine dose to 100 mg every 8 hours and increase metoprolol 200 mg twice daily.  If blood pressure still elevated as outpatient he will need a third antihypertensive agent such as amlodipine.  ESRD on HD (M, W, F) Missed his scheduled dialysis on 7/20.  Volume overloaded on exam.    Improved with dialysis on 7/22.  Counseled on adhering to his dialysis schedule.   Thrombocytopenia Acute on chronic.  Suspect secondary to acute viral illness.  Platelets dropped as  low as 68 and a 79 this morning.  Follow as outpatient.   Hyperlipidemia Continue statin  Anxiety Continue PRN Klonopin  Tobacco abuse Counseled strongly on cessation.  Patient working on quitting.  OSA on nighttime CPAP  Patient  clinically stable, no further shortness of breath and maintaining O2 sat on room air.  Remains afebrile.  Stable to be discharged home.  Procedure: Head CT Consults: Nephrology  Family communication: Spoke with wife on the phone  Discharge Instructions   Allergies as of 08/14/2018      Reactions   Codeine Nausea And Vomiting      Medication List    TAKE these medications   acetaminophen 500 MG tablet Commonly known as: TYLENOL Take 1 tablet (500 mg total) by mouth every 6 (six) hours as needed for moderate pain. What changed: how much to take   albuterol 108 (90 Base) MCG/ACT inhaler Commonly known as: VENTOLIN HFA Inhale 2 puffs into the lungs every 6 (six) hours as needed for wheezing or shortness of breath.   aspirin EC 81 MG tablet Take 81 mg by mouth daily.   b complex-vitamin c-folic acid 0.8 MG Tabs tablet TAKE 1 TABLET BY MOUTH EVERY DAY   Benadryl 25 MG tablet Generic drug: diphenhydrAMINE Take 50 mg by mouth at bedtime.   budesonide-formoterol 160-4.5 MCG/ACT inhaler Commonly known as: Symbicort Inhale 2 puffs into the lungs every 12 (twelve) hours.   calcitRIOL 0.5 MCG capsule Commonly known as: ROCALTROL Take 2 capsules (1 mcg total) by mouth every Monday, Wednesday, and Friday with hemodialysis.   clonazePAM 0.5 MG tablet Commonly known as: KLONOPIN TAKE 1 TABLET (0.5 MG TOTAL) BY MOUTH 2 (TWO) TIMES DAILY AS NEEDED FOR ANXIETY.   guaiFENesin 600 MG 12 hr tablet Commonly known as: MUCINEX Take 1,200 mg by mouth 2 (two) times daily.   guaiFENesin-dextromethorphan 100-10 MG/5ML syrup Commonly known as: ROBITUSSIN DM Take 10 mLs by mouth every 4 (four) hours as needed for cough.   hydrALAZINE 100 MG tablet Commonly known as: APRESOLINE Take 1 tablet (100 mg total) by mouth 3 (three) times daily. What changed:   when to take this  Another medication with the same name was removed. Continue taking this medication, and follow the directions you  see here.   metoprolol tartrate 100 MG tablet Commonly known as: LOPRESSOR Take 1 tablet (100 mg total) by mouth 2 (two) times daily. What changed:   medication strength  how much to take   nicotine 21 mg/24hr patch Commonly known as: NICODERM CQ - dosed in mg/24 hours Place 1 patch (21 mg total) onto the skin daily.   omeprazole 20 MG capsule Commonly known as: PRILOSEC TAKE 1 CAPSULE TWICE DAILY BEFORE A MEAL What changed:   how much to take  how to take this  when to take this  additional instructions   ondansetron 4 MG disintegrating tablet Commonly known as: Zofran ODT Take 1 tablet (4 mg total) by mouth every 8 (eight) hours as needed for nausea or vomiting.   pravastatin 40 MG tablet Commonly known as: PRAVACHOL TAKE 1 TABLET EVERY EVENING What changed: when to take this   [place] into the lungs at bedtime. CPAP   traMADol 50 MG tablet Commonly known as: ULTRAM Take 1 tablet (50 mg total) by mouth daily as needed. What changed: reasons to take this   Velphoro 500 MG chewable tablet Generic drug: sucroferric oxyhydroxide Chew 500-1,500 mg by mouth See admin instructions. Chew 3  tablets (1500 mg) by mouth up to three times daily with meals and 1 tablet (500 mg) with snacks   Vitamin D3 125 MCG (5000 UT) Tabs Take 5,000 Units by mouth daily.      Follow-up Information    Reed, Tiffany L, DO Follow up in 2 week(s).   Specialty: Geriatric Medicine Contact information: [address]. [place] [address] [phone]        [name], MD .   Specialty: Cardiology Contact information: [address] [place] [address] [phone]          Allergies  Allergen Reactions  . Codeine Nausea And Vomiting       Procedures/Studies: Ct Head Wo Contrast  Result Date: 08/12/2018 CLINICAL DATA:  Altered mental status, headache EXAM: CT HEAD WITHOUT CONTRAST TECHNIQUE: Contiguous axial images were  obtained from the base of the skull through the vertex without intravenous contrast. COMPARISON:  05/01/2017 FINDINGS: Brain: No evidence of acute infarction, hemorrhage, hydrocephalus, extra-axial collection or mass lesion/mass effect. Mild chronic low-density changes within the periventricular and subcortical white matter suggestive of chronic microvascular ischemic disease. Vascular: Scattered calcified atherosclerotic changes involving the large vessels at the skull base. No unexpected hyperdense vessel. Skull: Normal. Negative for fracture or focal lesion. Sinuses/Orbits: No acute finding. Other: None. IMPRESSION: No acute intracranial findings. Electronically Signed   By: Davina Poke M.D.   On: 08/12/2018 08:06   Dg Chest Portable 1 View  Result Date: 08/12/2018 CLINICAL DATA:  Shortness of breath. EXAM: PORTABLE CHEST 1 VIEW COMPARISON:  06/01/2018. FINDINGS: Cardiomegaly. Pulmonary venous congestion and bilateral interstitial prominence. No pleural effusion or pneumothorax. Prior cervical spine fusion. IMPRESSION: Cardiomegaly with pulmonary venous congestion and bilateral interstitial prominence suggesting CHF. Pneumonitis cannot be excluded. Electronically Signed   By: Marcello Moores  Register   On: 08/12/2018 07:15       Subjective: Not in distress.  No overnight events  Discharge Exam: Vitals:   08/14/18 0737 08/14/18 0743  BP:  (!) 152/72  Pulse: 91 79  Resp: 18 17  Temp:  98.5 F (36.9 C)  SpO2: 96% 99%   Vitals:   08/13/18 2252 08/14/18 0243 08/14/18 0737 08/14/18 0743  BP: (!) 168/74   (!) 152/72  Pulse: 86  91 79  Resp: 20  18 17   Temp: 98.6 F (37 C)   98.5 F (36.9 C)  TempSrc:    Oral  SpO2: 94%  96% 99%  Weight:  96.3 kg    Height:        General: Not in distress HEENT: Moist mucosa, supple neck Chest: Clear bilaterally CVs: Normal S1-S2 GI: Soft, nondistended and nontender Musculoskeletal: Warm, no edema    The results of significant diagnostics from  this hospitalization (including imaging, microbiology, ancillary and laboratory) are listed below for reference.     Microbiology: Recent Results (from the past 240 hour(s))  SARS Coronavirus 2 (CEPHEID- Performed in [place] hospital lab), Hosp Order     Status: None   Collection Time: 08/12/18  6:27 AM   Specimen: Nasopharyngeal Swab  Result Value Ref Range Status   SARS Coronavirus 2 NEGATIVE NEGATIVE Final    Comment: (NOTE) If result is NEGATIVE SARS-CoV-2 target nucleic acids are NOT DETECTED. The SARS-CoV-2 RNA is generally detectable in upper and lower  respiratory specimens during the acute phase of infection. The lowest  concentration of SARS-CoV-2 viral copies this assay can detect is 250  copies / mL. A negative result does  not preclude SARS-CoV-2 infection  and should not be used as the sole basis for treatment or other  patient management decisions.  A negative result may occur with  improper specimen collection / handling, submission of specimen other  than nasopharyngeal swab, presence of viral mutation(s) within the  areas targeted by this assay, and inadequate number of viral copies  (<250 copies / mL). A negative result must be combined with clinical  observations, patient history, and epidemiological information. If result is POSITIVE SARS-CoV-2 target nucleic acids are DETECTED. The SARS-CoV-2 RNA is generally detectable in upper and lower  respiratory specimens dur ing the acute phase of infection.  Positive  results are indicative of active infection with SARS-CoV-2.  Clinical  correlation with patient history and other diagnostic information is  necessary to determine patient infection status.  Positive results do  not rule out bacterial infection or co-infection with other viruses. If result is PRESUMPTIVE POSTIVE SARS-CoV-2 nucleic acids MAY BE PRESENT.   A presumptive positive result was obtained on the submitted specimen  and confirmed on repeat  testing.  While 2019 novel coronavirus  (SARS-CoV-2) nucleic acids may be present in the submitted sample  additional confirmatory testing may be necessary for epidemiological  and / or clinical management purposes  to differentiate between  SARS-CoV-2 and other Sarbecovirus currently known to infect humans.  If clinically indicated additional testing with an alternate test  methodology [phone]) is advised. The SARS-CoV-2 RNA is generally  detectable in upper and lower respiratory sp ecimens during the acute  phase of infection. The expected result is Negative. Fact Sheet for Patients:  StrictlyIdeas.no Fact Sheet for Healthcare Providers: BankingDealers.co.za This test is not yet approved or cleared by the [place] FDA and has been authorized for detection and/or diagnosis of SARS-CoV-2 by FDA under an Emergency Use Authorization (EUA).  This EUA will remain in effect (meaning this test can be used) for the duration of the COVID-19 declaration under Section 564(b)(1) of the Act, 21 U.S.C. section 360bbb-3(b)(1), unless the authorization is terminated or revoked sooner. Performed at [facility], [place] [address] [place], [place] [address]   Blood culture (routine x 2)     Status: None (Preliminary result)   Collection Time: 08/12/18  7:50 AM   Specimen: BLOOD RIGHT ARM  Result Value Ref Range Status   Specimen Description BLOOD RIGHT ARM  Final   Special Requests   Final    BOTTLES DRAWN AEROBIC AND ANAEROBIC Blood Culture adequate volume   Culture   Final    NO GROWTH 2 DAYS Performed at [facility], [place] [address] [place], [place] [address]    Report Status PENDING  Incomplete  Blood culture (routine x 2)     Status: None (Preliminary result)   Collection Time: 08/12/18  8:05 AM   Specimen: BLOOD RIGHT ARM  Result Value Ref Range Status   Specimen Description BLOOD RIGHT ARM  Final   Special Requests   Final     BOTTLES DRAWN AEROBIC AND ANAEROBIC Blood Culture results may not be optimal due to an excessive volume of blood received in culture bottles   Culture   Final    NO GROWTH 2 DAYS Performed at [facility], [place] [address] [place], [place] [address]    Report Status PENDING  Incomplete  Novel Coronavirus,NAA,(SEND-OUT TO REF LAB - TAT 24-48 hrs); Hosp Order     Status: None   Collection Time: 08/12/18 12:00 PM  Specimen: Nasopharyngeal Swab; Respiratory  Result Value Ref Range Status   SARS-CoV-2, NAA NOT DETECTED NOT DETECTED Final    Comment: (NOTE) This test was developed and its performance characteristics determined by [employer]. This test has not been FDA cleared or approved. This test has been authorized by FDA under an Emergency Use Authorization (EUA). This test is only authorized for the duration of time the declaration that circumstances exist justifying the authorization of the emergency use of in vitro diagnostic tests for detection of SARS-CoV-2 virus and/or diagnosis of COVID-19 infection under section 564(b)(1) of the Act, 21 U.S.C. 101BPZ-0(C)(5), unless the authorization is terminated or revoked sooner. When diagnostic testing is negative, the possibility of a false negative result should be considered in the context of a patient's recent exposures and the presence of clinical signs and symptoms consistent with COVID-19. An individual without symptoms of COVID-19 and who is not shedding SARS-CoV-2 virus would expect to have a negative (not detected) result in this assay. Performed  At: [facility] [address] [place], [place] [id number] Rush Farmer MD PN:[id number]    [place]  Final    Comment: Performed at [facility], [place] [address], [place], [place] [address]     Labs: BNP (last 3 results) Recent Labs    01/27/18 2100 05/14/18 1453  BNP 951.2* 1,950.9*   Basic Metabolic Panel: Recent  Labs  Lab 08/12/18 0626 08/12/18 0745 08/13/18 0613  NA 140 139 135  K 4.3 4.1 3.6  CL 100  --  96*  CO2 21*  --  23  GLUCOSE 143*  --  121*  BUN 67*  --  46*  CREATININE 19.59*  --  13.97*  CALCIUM 9.1  --  8.7*  PHOS  --   --  6.6*   Liver Function Tests: Recent Labs  Lab 08/12/18 1644 08/13/18 0613  AST 27  --   ALT 35  --   ALKPHOS 87  --   BILITOT 0.8  --   PROT 6.8  --   ALBUMIN 3.6 3.3*   No results for input(s): LIPASE, AMYLASE in the last 168 hours. Recent Labs  Lab 08/12/18 1644  AMMONIA 34   CBC: Recent Labs  Lab 08/12/18 0626 08/12/18 0745 08/13/18 0613 08/14/18 0351  WBC 7.2  --  6.5 5.1  HGB 8.2* 12.9* 9.8* 9.8*  HCT 25.4* 38.0* 30.1* 30.1*  MCV 98.1  --  98.0 96.8  PLT 80*  --  68* 77*   Cardiac Enzymes: No results for input(s): CKTOTAL, CKMB, CKMBINDEX, TROPONINI in the last 168 hours. BNP: Invalid input(s): POCBNP CBG: Recent Labs  Lab 08/12/18 0756  GLUCAP 124*   D-Dimer No results for input(s): DDIMER in the last 72 hours. Hgb A1c No results for input(s): HGBA1C in the last 72 hours. Lipid Profile No results for input(s): CHOL, HDL, LDLCALC, TRIG, CHOLHDL, LDLDIRECT in the last 72 hours. Thyroid function studies No results for input(s): TSH, T4TOTAL, T3FREE, THYROIDAB in the last 72 hours.  Invalid input(s): FREET3 Anemia work up No results for input(s): VITAMINB12, FOLATE, FERRITIN, TIBC, IRON, RETICCTPCT in the last 72 hours. Urinalysis    Component Value Date/Time   COLORURINE YELLOW 02/19/2017 1520   APPEARANCEUR HAZY (A) 02/19/2017 1520   LABSPEC 1.012 02/19/2017 1520   PHURINE 8.0 02/19/2017 1520   GLUCOSEU 50 (A) 02/19/2017 1520   HGBUR MODERATE (A) 02/19/2017 1520   BILIRUBINUR NEGATIVE 02/19/2017 [place] 02/19/2017 1520  PROTEINUR >=300 (A) 02/19/2017 1520   UROBILINOGEN 0.2 04/06/2014 1030   NITRITE NEGATIVE 02/19/2017 1520   LEUKOCYTESUR MODERATE (A) 02/19/2017 1520   Sepsis  Labs Invalid input(s): PROCALCITONIN,  WBC,  LACTICIDVEN Microbiology Recent Results (from the past 240 hour(s))  SARS Coronavirus 2 (CEPHEID- Performed in [place] hospital lab), Hosp Order     Status: None   Collection Time: 08/12/18  6:27 AM   Specimen: Nasopharyngeal Swab  Result Value Ref Range Status   SARS Coronavirus 2 NEGATIVE NEGATIVE Final    Comment: (NOTE) If result is NEGATIVE SARS-CoV-2 target nucleic acids are NOT DETECTED. The SARS-CoV-2 RNA is generally detectable in upper and lower  respiratory specimens during the acute phase of infection. The lowest  concentration of SARS-CoV-2 viral copies this assay can detect is 250  copies / mL. A negative result does not preclude SARS-CoV-2 infection  and should not be used as the sole basis for treatment or other  patient management decisions.  A negative result may occur with  improper specimen collection / handling, submission of specimen other  than nasopharyngeal swab, presence of viral mutation(s) within the  areas targeted by this assay, and inadequate number of viral copies  (<250 copies / mL). A negative result must be combined with clinical  observations, patient history, and epidemiological information. If result is POSITIVE SARS-CoV-2 target nucleic acids are DETECTED. The SARS-CoV-2 RNA is generally detectable in upper and lower  respiratory specimens dur ing the acute phase of infection.  Positive  results are indicative of active infection with SARS-CoV-2.  Clinical  correlation with patient history and other diagnostic information is  necessary to determine patient infection status.  Positive results do  not rule out bacterial infection or co-infection with other viruses. If result is PRESUMPTIVE POSTIVE SARS-CoV-2 nucleic acids MAY BE PRESENT.   A presumptive positive result was obtained on the submitted specimen  and confirmed on repeat testing.  While 2019 novel coronavirus  (SARS-CoV-2) nucleic  acids may be present in the submitted sample  additional confirmatory testing may be necessary for epidemiological  and / or clinical management purposes  to differentiate between  SARS-CoV-2 and other Sarbecovirus currently known to infect humans.  If clinically indicated additional testing with an alternate test  methodology [phone]) is advised. The SARS-CoV-2 RNA is generally  detectable in upper and lower respiratory sp ecimens during the acute  phase of infection. The expected result is Negative. Fact Sheet for Patients:  StrictlyIdeas.no Fact Sheet for Healthcare Providers: BankingDealers.co.za This test is not yet approved or cleared by the [place] FDA and has been authorized for detection and/or diagnosis of SARS-CoV-2 by FDA under an Emergency Use Authorization (EUA).  This EUA will remain in effect (meaning this test can be used) for the duration of the COVID-19 declaration under Section 564(b)(1) of the Act, 21 U.S.C. section 360bbb-3(b)(1), unless the authorization is terminated or revoked sooner. Performed at [facility], [place] [address] [place], [place] [address]   Blood culture (routine x 2)     Status: None (Preliminary result)   Collection Time: 08/12/18  7:50 AM   Specimen: BLOOD RIGHT ARM  Result Value Ref Range Status   Specimen Description BLOOD RIGHT ARM  Final   Special Requests   Final    BOTTLES DRAWN AEROBIC AND ANAEROBIC Blood Culture adequate volume   Culture   Final    NO GROWTH 2 DAYS Performed at [facility], [place]  [address]  [place], [name] [address]    Report Status PENDING  Incomplete  Blood culture (routine x 2)     Status: None (Preliminary result)   Collection Time: 08/12/18  8:05 AM   Specimen: BLOOD RIGHT ARM  Result Value Ref Range Status   Specimen Description BLOOD RIGHT ARM  Final   Special Requests   Final    BOTTLES DRAWN AEROBIC AND ANAEROBIC Blood Culture results  may not be optimal due to an excessive volume of blood received in culture bottles   Culture   Final    NO GROWTH 2 DAYS Performed at [facility], [place] [address], [place], [place] [address]    Report Status PENDING  Incomplete  Novel Coronavirus,NAA,(SEND-OUT TO REF LAB - TAT 24-48 hrs); Hosp Order     Status: None   Collection Time: 08/12/18 12:00 PM   Specimen: Nasopharyngeal Swab; Respiratory  Result Value Ref Range Status   SARS-CoV-2, NAA NOT DETECTED NOT DETECTED Final    Comment: (NOTE) This test was developed and its performance characteristics determined by [employer]. This test has not been FDA cleared or approved. This test has been authorized by FDA under an Emergency Use Authorization (EUA). This test is only authorized for the duration of time the declaration that circumstances exist justifying the authorization of the emergency use of in vitro diagnostic tests for detection of SARS-CoV-2 virus and/or diagnosis of COVID-19 infection under section 564(b)(1) of the Act, 21 U.S.C. 233KPQ-2(E)(4), unless the authorization is terminated or revoked sooner. When diagnostic testing is negative, the possibility of a false negative result should be considered in the context of a patient's recent exposures and the presence of clinical signs and symptoms consistent with COVID-19. An individual without symptoms of COVID-19 and who is not shedding SARS-CoV-2 virus would expect to have a negative (not detected) result in this assay. Performed  At: [facility] [address] [place], [place] [id number] Rush Farmer MD MY:[id number]    [place]  Final    Comment: Performed at [facility], [place] [address], [place], [place] [address]     Time coordinating discharge: >30 minutes  SIGNED:   Louellen Molder, MD  [facility] 08/14/2018, 9:50 AM Pager   If 7PM-7AM, please contact  night-coverage [email or web address] Password [email or web address]

## 2018-08-14 NOTE — TOC Transition Note (Signed)
Transition of Care [facility]) - CM/SW Discharge Note   Patient Details  Name: Kirk [name] MRN: [id number] Date of Birth: 03-20-1960  Transition of Care [facility]) CM/SW Contact:  Zenon Mayo, RN Phone Number: 08/14/2018, 10:20 AM   Clinical Narrative:    From home with wife, he has no issues with getting medications. He has a PCP. His wife will be transporting him home today.  NCM talked to him about using less sodium in food , need to use fresh veges instead of can veges, and baked food.   Final next level of care: Home/Self Care Barriers to Discharge: No Barriers Identified   Patient Goals and CMS Choice Patient states their goals for this hospitalization and ongoing recovery are:: to loose weight to get blood pressure down   Choice offered to / list presented to : NA  Discharge Placement                       Discharge Plan and Services In-house Referral: NA Discharge Planning Services: CM Consult Post Acute Care Choice: NA          DME Arranged: (NA)         HH Arranged: NA          Social Determinants of Health (SDOH) Interventions     Readmission Risk Interventions Readmission Risk Prevention Plan 08/14/2018 06/03/2018  Transportation Screening Complete Complete  Medication Review Press photographer) Complete Complete  PCP or Specialist appointment within 3-5 days of discharge Complete Complete  HRI or Home Care Consult Complete Complete  SW Recovery Care/Counseling Consult Complete Complete  Palliative Care Screening Not Applicable Not [place] Not Applicable Not Applicable  Some recent data might be hidden

## 2018-08-15 ENCOUNTER — Telehealth: Payer: Self-pay | Admitting: *Deleted

## 2018-08-15 DIAGNOSIS — N2581 Secondary hyperparathyroidism of renal origin: Secondary | ICD-10-CM | POA: Diagnosis not present

## 2018-08-15 DIAGNOSIS — N186 End stage renal disease: Secondary | ICD-10-CM | POA: Diagnosis not present

## 2018-08-15 NOTE — Telephone Encounter (Signed)
I have made the 1st attempt to contact the patient or family member in charge, in order to follow up from recently being discharged from the hospital. Unable to leave voicemail due to mailbox full but I will make another attempt at a different time.

## 2018-08-17 LAB — CULTURE, BLOOD (ROUTINE X 2)
Culture: NO GROWTH
Culture: NO GROWTH
Special Requests: ADEQUATE

## 2018-08-18 DIAGNOSIS — N186 End stage renal disease: Secondary | ICD-10-CM | POA: Diagnosis not present

## 2018-08-18 DIAGNOSIS — N2581 Secondary hyperparathyroidism of renal origin: Secondary | ICD-10-CM | POA: Diagnosis not present

## 2018-08-18 NOTE — Telephone Encounter (Signed)
I have made the 2nd attempt to contact the patient or family member in charge, in order to follow up from recently being discharged from the hospital. Unable to leave voicemail due to mailbox full but I will make another attempt at a different time.

## 2018-08-19 NOTE — Telephone Encounter (Signed)
I have made the 3rd attempt to contact the patient or family member in charge, in order to follow up from recently being discharged from the hospital.

## 2018-08-20 ENCOUNTER — Ambulatory Visit: Payer: Medicare Other | Admitting: Physician Assistant

## 2018-08-20 DIAGNOSIS — N186 End stage renal disease: Secondary | ICD-10-CM | POA: Diagnosis not present

## 2018-08-20 DIAGNOSIS — N2581 Secondary hyperparathyroidism of renal origin: Secondary | ICD-10-CM | POA: Diagnosis not present

## 2018-08-22 DIAGNOSIS — N186 End stage renal disease: Secondary | ICD-10-CM | POA: Diagnosis not present

## 2018-08-22 DIAGNOSIS — N2581 Secondary hyperparathyroidism of renal origin: Secondary | ICD-10-CM | POA: Diagnosis not present

## 2018-08-23 DIAGNOSIS — N186 End stage renal disease: Secondary | ICD-10-CM | POA: Diagnosis not present

## 2018-08-23 DIAGNOSIS — Z992 Dependence on renal dialysis: Secondary | ICD-10-CM | POA: Diagnosis not present

## 2018-08-23 DIAGNOSIS — T8612 Kidney transplant failure: Secondary | ICD-10-CM | POA: Diagnosis not present

## 2018-08-25 DIAGNOSIS — Z23 Encounter for immunization: Secondary | ICD-10-CM | POA: Diagnosis not present

## 2018-08-25 DIAGNOSIS — N186 End stage renal disease: Secondary | ICD-10-CM | POA: Diagnosis not present

## 2018-08-25 DIAGNOSIS — Z992 Dependence on renal dialysis: Secondary | ICD-10-CM | POA: Diagnosis not present

## 2018-08-25 DIAGNOSIS — D631 Anemia in chronic kidney disease: Secondary | ICD-10-CM | POA: Diagnosis not present

## 2018-08-25 DIAGNOSIS — N2581 Secondary hyperparathyroidism of renal origin: Secondary | ICD-10-CM | POA: Diagnosis not present

## 2018-08-25 NOTE — Progress Notes (Signed)
Cardiology Office Note:    Date:  08/26/2018   ID:  Kirk [name], DOB [birth year], MRN [id number]  PCP:  Gayland Curry, DO  Cardiologist:  [name], MD   Referring MD: Gayland Curry, DO   Chief Complaint  Patient presents with  . Hospitalization Follow-up    chest pain    History of Present Illness:    Kirk [name] is a 58 y.o. male with a hx of uncontrolled hypertension, chronic diastolic heart failure, OSA on CPAP, obesity hypoventilation syndrome, ESRD on HD, hx of failed renal transplant, drug use, current smoker, and HLD. He had no significant CAD by heart cath in 2013. This was in anticipation for kidney transplant. Transplant unfortunately failed. He was last seen by cardiology 01/2018 while hospitalized with post-HD chest pain found to have bronchitis. Troponin elevation was felt inconsistent with ACS and no ischemic evaluation was completed. He did have PAF on EKG dated 01/28/18. He was given IV beta blocker and converted to NSR. He was not anticoagulated with a CHA2DS2-VASc = 1 (HTN).   He was seen in the ER 05/14/18 with respiratory symptoms and reported COVID-19 exposure at HD. He was negative for COVID-19 and discharged.   He was recently seen by pulmonary for preop clearance for lap chole 06/2018. Prior to this visit, he was admitted to [facility] with SOB found to have sepsis secondary to PNA. It appears he had restrictive disease on spirometry, may related to his obesity.   Admitted 08/12/18 with altered mental status after missing HD, felt dizzy/near-syncope. He was dialyzed, optimized, and discharged.  He presents today for follow up. Pt is here with his wife, who helps because he is deaf in one ear. He states he is easily fatigued, but this has been going on for several years. He is hypertensive today - was 202/101, 195/90 on recheck by me. He has not taken any anti-hypertensives today.  He states he is under a lot of stress. His wife reports his pressures are generally  140-150s at home. He is scheduled to see his PCP tomorrow to possibly reduce some of his medications. He also reports chest pain weekly that has been going on for a couple of years. Pain occurs with activity and rest. The pain is described as sharp and other times a pressure. Radiates down his right arm. CP is worse with position changes and worse with deep inspiration. However, he also describes symptoms that are concerning for unstable angina. I question CP related to his pressure today. However, his wife states that he has chest pain when his pressure is in the 140-150s. We had a long discussion concerning stress testing. We also discussed his HTN and they declined an ER visit for pressure control. He then states EMS has already seen him today after a syncopal episodes this morning while he was carrying a heavy dog. He declined transport to the ER at that time. I expressed concern about complications from a pressure that high. He assures me he will take his medications when he gets home. He denies headache and changes in vision, but is having ongoing chest pain. EKG during chest pain episode without signs of acute ischemia.     Past Medical History:  Diagnosis Date  . Acute edema of lung, unspecified   . Acute, but ill-defined, cerebrovascular disease   . Allergy   . Anemia   . 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)   . Anxiety   . Asthma   .  Asthma    moderate persistent  . Carpal tunnel syndrome   . Cellulitis and abscess of trunk   . Cholelithiasis 07/13/2014  . Chronic headaches   . Debility, unspecified   . Dermatophytosis of the body   . Dysrhythmia    history of  . Edema   . End stage renal disease on dialysis [facility])    "MWF; [facility] in [facility]" (10/21/2014)  . Essential hypertension, benign   . GERD (gastroesophageal reflux disease)   . Gout, unspecified   . HTN (hypertension)   . Hypertrophy of prostate without urinary obstruction and other lower urinary tract  symptoms (LUTS)   . Hypotension, unspecified   . Impotence of organic origin   . Insomnia, unspecified   . Kidney replaced by transplant   . Localization-related (focal) (partial) epilepsy and epileptic syndromes with complex partial seizures, without mention of intractable epilepsy   . Lumbago   . Memory loss   . OSA on CPAP   . Other and unspecified hyperlipidemia   . Other chronic nonalcoholic liver disease   . Other malaise and fatigue   . Other nonspecific abnormal serum enzyme levels   . Pain in joint, lower leg   . Pain in joint, upper arm   . Pneumonia "several times"  . Renal dialysis status(V45.11) 02/05/2010   restarted 01/02/13 ofter renal trransplant failure  . Secondary hyperparathyroidism (of renal origin)   . Shortness of breath   . Sleep apnea   . Tension headache   . Unspecified constipation   . Unspecified essential hypertension   . Unspecified hereditary and idiopathic peripheral neuropathy   . Unspecified vitamin D deficiency     Past Surgical History:  Procedure Laterality Date  . AV FISTULA PLACEMENT Left ?2010   "forearm; at [facility]"  . BACK SURGERY    . CARDIAC CATHETERIZATION  03/21/2011  . CHOLECYSTECTOMY N/A 10/21/2014   Procedure: LAPAROSCOPIC CHOLECYSTECTOMY WITH INTRAOPERATIVE CHOLANGIOGRAM;  Surgeon: Autumn Messing III, MD;  Location: [place];  Service: General;  Laterality: N/A;  . COLONOSCOPY    . 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 KIDNEY TRANSPLANT  08/17/2011   [place]  10/21/2014   w/IOC  . LEFT HEART CATHETERIZATION WITH CORONARY ANGIOGRAM N/A 03/21/2011   Procedure: LEFT HEART CATHETERIZATION WITH CORONARY ANGIOGRAM;  Surgeon: [name], MD;  Location: [facility] C[facility] LAB;  Service: Cardiovascular;  Laterality: N/A;  . NEPHRECTOMY  08/2013   removed transplaned kidney  . POSTERIOR FUSION CERVICAL SPINE  06/25/2012   for spinal stenosis  . VASECTOMY  2010    Current  Medications: Current Meds  Medication Sig  . acetaminophen (TYLENOL) 500 MG tablet Take 1 tablet (500 mg total) by mouth every 6 (six) hours as needed for moderate pain. (Patient taking differently: Take 1,000 mg by mouth every 6 (six) hours as needed for moderate pain. )  . albuterol (PROVENTIL HFA;VENTOLIN HFA) 108 (90 Base) MCG/ACT inhaler Inhale 2 puffs into the lungs every 6 (six) hours as needed for wheezing or shortness of breath.  Marland Kitchen aspirin EC 81 MG tablet Take 81 mg by mouth daily.  Marland Kitchen b complex-vitamin c-folic acid (NEPHRO-VITE) 0.8 MG TABS tablet TAKE 1 TABLET BY MOUTH EVERY DAY (Patient taking differently: Take 1 tablet by mouth daily. )  . budesonide-formoterol (SYMBICORT) 160-4.5 MCG/ACT inhaler Inhale 2 puffs into the lungs every 12 (twelve) hours.  . calcitRIOL (ROCALTROL) 0.5 MCG capsule Take 2  capsules (1 mcg total) by mouth every Monday, Wednesday, and Friday with hemodialysis.  . Cholecalciferol (VITAMIN D3) 5000 UNITS TABS Take 5,000 Units by mouth daily.   . clonazePAM (KLONOPIN) 0.5 MG tablet TAKE 1 TABLET (0.5 MG TOTAL) BY MOUTH 2 (TWO) TIMES DAILY AS NEEDED FOR ANXIETY.  . diphenhydrAMINE (BENADRYL) 25 MG tablet Take 50 mg by mouth at bedtime.   Marland Kitchen guaiFENesin (MUCINEX) 600 MG 12 hr tablet Take 1,200 mg by mouth 2 (two) times daily.  Marland Kitchen guaiFENesin-dextromethorphan (ROBITUSSIN DM) 100-10 MG/5ML syrup Take 10 mLs by mouth every 4 (four) hours as needed for cough.  . hydrALAZINE (APRESOLINE) 100 MG tablet Take 1 tablet (100 mg total) by mouth 3 (three) times daily.  . metoprolol tartrate (LOPRESSOR) 100 MG tablet Take 1 tablet (100 mg total) by mouth 2 (two) times daily.  . nicotine (NICODERM CQ - DOSED IN MG/24 HOURS) 21 mg/24hr patch Place 1 patch (21 mg total) onto the skin daily.  Marland Kitchen omeprazole (PRILOSEC) 20 MG capsule TAKE 1 CAPSULE TWICE DAILY BEFORE A MEAL (Patient taking differently: Take 20 mg by mouth 2 (two) times daily before a meal. )  . ondansetron (ZOFRAN ODT) 4 MG  disintegrating tablet Take 1 tablet (4 mg total) by mouth every 8 (eight) hours as needed for nausea or vomiting.  . pravastatin (PRAVACHOL) 40 MG tablet TAKE 1 TABLET EVERY EVENING (Patient taking differently: Take 40 mg by mouth at bedtime. )  . PRESCRIPTION MEDICATION Inhale into the lungs at bedtime. CPAP  . sucroferric oxyhydroxide (VELPHORO) 500 MG chewable tablet Chew 500-1,500 mg by mouth See admin instructions. Chew 3 tablets (1500 mg) by mouth up to three times daily with meals and 1 tablet (500 mg) with snacks  . traMADol (ULTRAM) 50 MG tablet Take 1 tablet (50 mg total) by mouth daily as needed. (Patient taking differently: Take 50 mg by mouth daily as needed (pain). )     Allergies:   Codeine   Social History   Socioeconomic History  . Marital status: Married    Spouse name: [name]  . Number of children: 3  . Years of education: Not on file  . Highest education level: Not on file  Occupational History  . Occupation: disabled    Employer: DISABLED  Social Needs  . Financial resource strain: Not hard at all  . Food insecurity    Worry: Never true    Inability: Never true  . Transportation needs    Medical: No    Non-medical: No  Tobacco Use  . Smoking status: Current Every Day Smoker    Packs/day: 0.50    Years: 32.00    Pack years: 16.00    Types: Cigarettes  . Smokeless tobacco: Never Used  . Tobacco comment: 1 cigarette every couple days  Substance and Sexual Activity  . Alcohol use: No    Alcohol/week: 0.0 standard drinks  . Drug use: No  . Sexual activity: Yes  Lifestyle  . Physical activity    Days per week: 3 days    Minutes per session: 20 min  . Stress: Only a little  Relationships  . Social connections    Talks on phone: More than three times a week    Gets together: More than three times a week    Attends religious service: More than 4 times per year    Active member of club or organization: No    Attends meetings of clubs or organizations:  Never    Relationship  status: Married  Other Topics Concern  . Not on file  Social History Narrative   Drinks 1 cup of caffeine daily.     Family History: The patient's family history is negative for Colon cancer, Esophageal cancer, Rectal cancer, and Stomach cancer. He was adopted.  ROS:   Please see the history of present illness.     All other systems reviewed and are negative.  EKGs/Labs/Other Studies Reviewed:    The following studies were reviewed today:  Echo 01/29/18: Study Conclusions  - Left ventricle: The cavity size was mildly dilated. Wall   thickness was normal. The estimated ejection fraction was 55%.   Wall motion was normal; there were no regional wall motion   abnormalities. Features are consistent with a pseudonormal left   ventricular filling pattern, with concomitant abnormal relaxation   and increased filling pressure (grade 2 diastolic dysfunction). - Aortic valve: There was mild stenosis. Mean gradient (S): 11 mm   Hg. - Mitral valve: There was trivial regurgitation. - Left atrium: The atrium was moderately dilated. - Right ventricle: The cavity size was normal. Systolic function   was normal. - Right atrium: The atrium was mildly dilated. - Pulmonary arteries: No complete TR doppler jet so unable to   estimate PA systolic pressure. - Inferior vena cava: The vessel was normal in size. The   respirophasic diameter changes were in the normal range (>= 50%),   consistent with normal central venous pressure.  Impressions: - Mildly dilated LV with EF 55%. Moderate diastolic dysfunction.   Normal RV size and systolic function. Mild aortic stenosis.   Biatrial enlargement.   EKG:  EKG is ordered today.  The ekg ordered today demonstrates sinus rhythm with HR 86, possible LVH  Recent Labs: 01/28/2018: TSH 0.787 01/29/2018: Magnesium 2.1 05/14/2018: B Natriuretic Peptide 1,092.0 08/12/2018: ALT 35 08/13/2018: BUN 46; Creatinine, Ser 13.97; Potassium 3.6;  Sodium 135 08/14/2018: Hemoglobin 9.8; Platelets 77  Recent Lipid Panel    Component Value Date/Time   CHOL 106 01/28/2018 0242   CHOL 195 06/29/2015 1533   TRIG 160 (H) 01/28/2018 0242   HDL 23 (L) 01/28/2018 0242   HDL 17 (L) 06/29/2015 1533   CHOLHDL 4.6 01/28/2018 0242   VLDL 32 01/28/2018 0242   LDLCALC 51 01/28/2018 0242   LDLCALC Comment 06/29/2015 1533    Physical Exam:    VS:  BP (!) 195/90   Pulse 94   Temp 97.8 F (36.6 C)   Ht 5\' 8"  (1.727 m)   Wt 214 lb 3.2 oz (97.2 kg)   BMI 32.57 kg/m     Wt Readings from Last 3 Encounters:  08/26/18 214 lb 3.2 oz (97.2 kg)  08/14/18 212 lb 4.9 oz (96.3 kg)  07/08/18 222 lb 14.2 oz (101.1 kg)     GEN: Well nourished, well developed in no acute distress HEENT: Normal NECK: No JVD; No carotid bruits LYMPHATICS: No lymphadenopathy CARDIAC: RRR, no murmurs, rubs, gallops RESPIRATORY:  Clear to auscultation without rales, wheezing or rhonchi, nonproductive cough ABDOMEN: Soft, non-tender, non-distended MUSCULOSKELETAL:  No edema; No deformity  SKIN: Warm and dry NEUROLOGIC:  Alert and oriented x 3 PSYCHIATRIC:  Normal affect   ASSESSMENT:    1. Chest pain, unspecified type   2. Uncontrolled hypertension   3. Hyperlipidemia, unspecified hyperlipidemia type   4. PAF (paroxysmal atrial fibrillation) (HCC)    PLAN:    In order of problems listed above:  Chest pain, unspecified type Uncontrolled hypertension  Chest pain  symptoms include both typical and atypical features. EKG captured during a chest pain episode without signs of acute ischemia. I will not draw a troponin as he is an HD patient. This bout of chest pain is likely related to his pressure, which was 202/101 on first check, 195/90 on recheck - he had not taken any medications today. He declined ER visit to evaluate chest pain and to normalize his BP. Of note, he had a syncopal episode this morning. Sheriff called EMS, but he refused EMS transport to the ER.  He states he always "gets straightened out." I expressed concern about his chest pain and BP, but he does not want to go to the ER. We will evaluate with a lexiscan myoview. I do not want to wait for a CT coronary. He is scheduled for moview stress test on 09/02/18.  I will not make medication changes today as he has not taken his medications.    ESRD on HD He has been complaint since discharge.    PAF not on AC NSR today.  No complaints of palpitations, although I can't explain his syncopal episode this morning, which he downplays.   Hyperlipidemia On pravachol   Follow up with Dr. Debara Pickett in 6 months, sooner if stress test is abnormal.  Medication Adjustments/Labs and Tests Ordered: Current medicines are reviewed at length with the patient today.  Concerns regarding medicines are outlined above.  Orders Placed This Encounter  Procedures  . MYOCARDIAL PERFUSION IMAGING   No orders of the defined types were placed in this encounter.   Signed, Ledora Bottcher, PA  08/26/2018 4:10 PM    [facility]

## 2018-08-26 ENCOUNTER — Ambulatory Visit (INDEPENDENT_AMBULATORY_CARE_PROVIDER_SITE_OTHER): Payer: Medicare Other | Admitting: Physician Assistant

## 2018-08-26 ENCOUNTER — Encounter: Payer: Self-pay | Admitting: Physician Assistant

## 2018-08-26 ENCOUNTER — Other Ambulatory Visit: Payer: Self-pay

## 2018-08-26 VITALS — BP 195/90 | HR 94 | Temp 97.8°F | Ht 68.0 in | Wt 214.2 lb

## 2018-08-26 DIAGNOSIS — R079 Chest pain, unspecified: Secondary | ICD-10-CM

## 2018-08-26 DIAGNOSIS — I10 Essential (primary) hypertension: Secondary | ICD-10-CM | POA: Diagnosis not present

## 2018-08-26 DIAGNOSIS — I480 Paroxysmal atrial fibrillation: Secondary | ICD-10-CM | POA: Diagnosis not present

## 2018-08-26 DIAGNOSIS — E785 Hyperlipidemia, unspecified: Secondary | ICD-10-CM | POA: Diagnosis not present

## 2018-08-26 NOTE — Patient Instructions (Addendum)
Medication Instructions:  Your physician recommends that you continue on your current medications as directed. Please refer to the Current Medication list given to you today.  If you need a refill on your cardiac medications before your next appointment, please call your pharmacy.    Testing/Procedures: Your physician has requested that you have a lexiscan myoview. For further information please visit [email or web address]. Please follow instruction sheet, as given.  Follow-Up: At [facility], you and your health needs are our priority.  As part of our continuing mission to provide you with exceptional heart care, we have created designated Provider Care Teams.  These Care Teams include your primary Cardiologist (physician) and Advanced Practice Providers (APPs -  Physician Assistants and Nurse Practitioners) who all work together to provide you with the care you need, when you need it. You will need a follow up appointment in 6 months.  Please call our office 2 months in advance to schedule this appointment.  You may see [name], MD or one of the following Advanced Practice Providers on your designated Care Team: [place], [place] . Fabian Sharp, PA-C  Any Other Special Instructions Will Be Listed Below (If Applicable). None

## 2018-08-27 DIAGNOSIS — N186 End stage renal disease: Secondary | ICD-10-CM | POA: Diagnosis not present

## 2018-08-27 DIAGNOSIS — D631 Anemia in chronic kidney disease: Secondary | ICD-10-CM | POA: Diagnosis not present

## 2018-08-27 DIAGNOSIS — Z23 Encounter for immunization: Secondary | ICD-10-CM | POA: Diagnosis not present

## 2018-08-27 DIAGNOSIS — Z992 Dependence on renal dialysis: Secondary | ICD-10-CM | POA: Diagnosis not present

## 2018-08-27 DIAGNOSIS — N2581 Secondary hyperparathyroidism of renal origin: Secondary | ICD-10-CM | POA: Diagnosis not present

## 2018-08-28 ENCOUNTER — Other Ambulatory Visit: Payer: Self-pay

## 2018-08-28 ENCOUNTER — Ambulatory Visit (INDEPENDENT_AMBULATORY_CARE_PROVIDER_SITE_OTHER): Payer: Medicare Other | Admitting: Internal Medicine

## 2018-08-28 ENCOUNTER — Encounter: Payer: Self-pay | Admitting: Internal Medicine

## 2018-08-28 VITALS — BP 160/70 | HR 69 | Temp 98.3°F | Ht 67.0 in | Wt 212.0 lb

## 2018-08-28 DIAGNOSIS — E1121 Type 2 diabetes mellitus with diabetic nephropathy: Secondary | ICD-10-CM | POA: Diagnosis not present

## 2018-08-28 DIAGNOSIS — B351 Tinea unguium: Secondary | ICD-10-CM

## 2018-08-28 DIAGNOSIS — Z6833 Body mass index (BMI) 33.0-33.9, adult: Secondary | ICD-10-CM | POA: Diagnosis not present

## 2018-08-28 DIAGNOSIS — F419 Anxiety disorder, unspecified: Secondary | ICD-10-CM

## 2018-08-28 DIAGNOSIS — M170 Bilateral primary osteoarthritis of knee: Secondary | ICD-10-CM | POA: Diagnosis not present

## 2018-08-28 DIAGNOSIS — Q8781 Alport syndrome: Secondary | ICD-10-CM | POA: Diagnosis not present

## 2018-08-28 DIAGNOSIS — I10 Essential (primary) hypertension: Secondary | ICD-10-CM | POA: Diagnosis not present

## 2018-08-28 DIAGNOSIS — E6609 Other obesity due to excess calories: Secondary | ICD-10-CM | POA: Diagnosis not present

## 2018-08-28 DIAGNOSIS — N186 End stage renal disease: Secondary | ICD-10-CM

## 2018-08-28 MED ORDER — TRAMADOL HCL 50 MG PO TABS: 50.0000 mg | ORAL_TABLET | Freq: Every day | ORAL | 0 refills | Status: DC | PRN

## 2018-08-28 MED ORDER — CLONAZEPAM 0.5 MG PO TABS: 0.5000 mg | ORAL_TABLET | Freq: Two times a day (BID) | ORAL | 0 refills | Status: DC | PRN

## 2018-08-28 MED ORDER — METOPROLOL TARTRATE 100 MG PO TABS: 100.0000 mg | ORAL_TABLET | Freq: Two times a day (BID) | ORAL | 1 refills | Status: DC

## 2018-08-28 MED ORDER — HYDRALAZINE HCL 100 MG PO TABS: 100.0000 mg | ORAL_TABLET | Freq: Three times a day (TID) | ORAL | 1 refills | Status: DC

## 2018-08-28 NOTE — Patient Instructions (Addendum)
Please start the medications for your blood pressure that were ordered at hospital discharge as soon as you receive them via mail order.  I sent them today.  I suspect your headaches will improve with better blood pressure control on a consistent basis.  Calorie Counting for Weight Loss Calories are units of energy. Your body needs a certain amount of calories from food to keep you going throughout the day. When you eat more calories than your body needs, your body stores the extra calories as fat. When you eat fewer calories than your body needs, your body burns fat to get the energy it needs. Calorie counting means keeping track of how many calories you eat and drink each day. Calorie counting can be helpful if you need to lose weight. If you make sure to eat fewer calories than your body needs, you should lose weight. Ask your health care provider what a healthy weight is for you. For calorie counting to work, you will need to eat the right number of calories in a day in order to lose a healthy amount of weight per week. A dietitian can help you determine how many calories you need in a day and will give you suggestions on how to reach your calorie goal.  A healthy amount of weight to lose per week is usually 1-2 lb (0.5-0.9 kg). This usually means that your daily calorie intake should be reduced by 500-750 calories.  Eating 1,200 - 1,500 calories per day can help most women lose weight.  Eating 1,500 - 1,800 calories per day can help most men lose weight. What is my plan? My goal is to have __________ calories per day. If I have this many calories per day, I should lose around __________ pounds per week. What do I need to know about calorie counting? In order to meet your daily calorie goal, you will need to:  Find out how many calories are in each food you would like to eat. Try to do this before you eat.  Decide how much of the food you plan to eat.  Write down what you ate and how many  calories it had. Doing this is called keeping a food log. To successfully lose weight, it is important to balance calorie counting with a healthy lifestyle that includes regular activity. Aim for 150 minutes of moderate exercise (such as walking) or 75 minutes of vigorous exercise (such as running) each week. Where do I find calorie information?  The number of calories in a food can be found on a Nutrition Facts label. If a food does not have a Nutrition Facts label, try to look up the calories online or ask your dietitian for help. Remember that calories are listed per serving. If you choose to have more than one serving of a food, you will have to multiply the calories per serving by the amount of servings you plan to eat. For example, the label on a package of bread might say that a serving size is 1 slice and that there are 90 calories in a serving. If you eat 1 slice, you will have eaten 90 calories. If you eat 2 slices, you will have eaten 180 calories. How do I keep a food log? Immediately after each meal, record the following information in your food log:  What you ate. Don't forget to include toppings, sauces, and other extras on the food.  How much you ate. This can be measured in cups, ounces, or number of  items.  How many calories each food and drink had.  The total number of calories in the meal. Keep your food log near you, such as in a small notebook in your pocket, or use a mobile app or website. Some programs will calculate calories for you and show you how many calories you have left for the day to meet your goal. What are some calorie counting tips?   Use your calories on foods and drinks that will fill you up and not leave you hungry: ? Some examples of foods that fill you up are nuts and nut butters, vegetables, lean proteins, and high-fiber foods like whole grains. High-fiber foods are foods with more than 5 g fiber per serving. ? Drinks such as sodas, specialty coffee  drinks, alcohol, and juices have a lot of calories, yet do not fill you up.  Eat nutritious foods and avoid empty calories. Empty calories are calories you get from foods or beverages that do not have many vitamins or protein, such as candy, sweets, and soda. It is better to have a nutritious high-calorie food (such as an avocado) than a food with few nutrients (such as a bag of chips).  Know how many calories are in the foods you eat most often. This will help you calculate calorie counts faster.  Pay attention to calories in drinks. Low-calorie drinks include water and unsweetened drinks.  Pay attention to nutrition labels for "low fat" or "fat free" foods. These foods sometimes have the same amount of calories or more calories than the full fat versions. They also often have added sugar, starch, or salt, to make up for flavor that was removed with the fat.  Find a way of tracking calories that works for you. Get creative. Try different apps or programs if writing down calories does not work for you. What are some portion control tips?  Know how many calories are in a serving. This will help you know how many servings of a certain food you can have.  Use a measuring cup to measure serving sizes. You could also try weighing out portions on a kitchen scale. With time, you will be able to estimate serving sizes for some foods.  Take some time to put servings of different foods on your favorite plates, bowls, and cups so you know what a serving looks like.  Try not to eat straight from a bag or box. Doing this can lead to overeating. Put the amount you would like to eat in a cup or on a plate to make sure you are eating the right portion.  Use smaller plates, glasses, and bowls to prevent overeating.  Try not to multitask (for example, watch TV or use your computer) while eating. If it is time to eat, sit down at a table and enjoy your food. This will help you to know when you are full. It will  also help you to be aware of what you are eating and how much you are eating. What are tips for following this plan? Reading food labels  Check the calorie count compared to the serving size. The serving size may be smaller than what you are used to eating.  Check the source of the calories. Make sure the food you are eating is high in vitamins and protein and low in saturated and trans fats. Shopping  Read nutrition labels while you shop. This will help you make healthy decisions before you decide to purchase your food.  Make a  grocery list and stick to it. Cooking  Try to cook your favorite foods in a healthier way. For example, try baking instead of frying.  Use low-fat dairy products. Meal planning  Use more fruits and vegetables. Half of your plate should be fruits and vegetables.  Include lean proteins like poultry and fish. How do I count calories when eating out?  Ask for smaller portion sizes.  Consider sharing an entree and sides instead of getting your own entree.  If you get your own entree, eat only half. Ask for a box at the beginning of your meal and put the rest of your entree in it so you are not tempted to eat it.  If calories are listed on the menu, choose the lower calorie options.  Choose dishes that include vegetables, fruits, whole grains, low-fat dairy products, and lean protein.  Choose items that are boiled, broiled, grilled, or steamed. Stay away from items that are buttered, battered, fried, or served with cream sauce. Items labeled "crispy" are usually fried, unless stated otherwise.  Choose water, low-fat milk, unsweetened iced tea, or other drinks without added sugar. If you want an alcoholic beverage, choose a lower calorie option such as a glass of wine or light beer.  Ask for dressings, sauces, and syrups on the side. These are usually high in calories, so you should limit the amount you eat.  If you want a salad, choose a garden salad and ask  for grilled meats. Avoid extra toppings like bacon, cheese, or fried items. Ask for the dressing on the side, or ask for olive oil and vinegar or lemon to use as dressing.  Estimate how many servings of a food you are given. For example, a serving of cooked rice is  cup or about the size of half a baseball. Knowing serving sizes will help you be aware of how much food you are eating at restaurants. The list below tells you how big or small some common portion sizes are based on everyday objects: ? 1 oz--4 stacked dice. ? 3 oz--1 deck of cards. ? 1 tsp--1 die. ? 1 Tbsp-- a ping-pong ball. ? 2 Tbsp--1 ping-pong ball. ?  cup-- baseball. ? 1 cup--1 baseball. Summary  Calorie counting means keeping track of how many calories you eat and drink each day. If you eat fewer calories than your body needs, you should lose weight.  A healthy amount of weight to lose per week is usually 1-2 lb (0.5-0.9 kg). This usually means reducing your daily calorie intake by 500-750 calories.  The number of calories in a food can be found on a Nutrition Facts label. If a food does not have a Nutrition Facts label, try to look up the calories online or ask your dietitian for help.  Use your calories on foods and drinks that will fill you up, and not on foods and drinks that will leave you hungry.  Use smaller plates, glasses, and bowls to prevent overeating. This information is not intended to replace advice given to you by your health care provider. Make sure you discuss any questions you have with your health care provider. Document Released: 01/08/2005 Document Revised: 09/27/2017 Document Reviewed: 12/09/2015 [employer]vier Patient Education  2020 [employer].

## 2018-08-28 NOTE — Addendum Note (Signed)
Addended by: Crissie Reese on: 08/28/2018 04:32 PM   Modules accepted: Orders

## 2018-08-28 NOTE — Progress Notes (Signed)
Location:  [facility]   Advanced Directives 08/12/2018  Does Patient Have a Medical Advance Directive? No  Does patient want to make changes to medical advance directive? -  Would patient like information on creating a medical advance directive? No - Patient declined  Pre-existing out of facility DNR order (yellow form or pink MOST form) -     Chief Complaint  Patient presents with  . Transitions Of Care    discharged 08/14/2018    HPI: Patient is a 58 y.o. male seen today for follow up after recent hospitalization. He was admitted to [facility] on 08/12/2018 with acute respiratory failure hypoxia and altered mental status. CT head was unremarkable. Chest x-ray suggested pulmonary venous congestion and bilateral interstitial prominence of CHF. COVID-19 testing was negative. The patient missed dialysis on 08/11/2018 prior to hospitalization. Fluid overload was suspected underlying cause for respiratory issues and hypertensive urgency. His blood pressure medications were adjusted and he was discharged home 08/14/2018. Since discharge, he has not started the new doses of blood pressure medication.   He continues to struggle with anxiety on a daily basis. In the past few months, he has had to care for his mother in law more. Her declining health has left him stressed and he continues to smoke as a stress reliever. He continues to use klonopin on a daily basis.   His smoking has increased within the past few months. He is currently smoking 1/2 pack a day. He has tried many smoking cessation interventions in the past with no success. At night his productive cough keeps him up at night. He continues to use mucinex and robitussin for night time cough and phlegm. He does not think it helps.   With exertion he is short of breath and wheezing. He is trying to stay active throughout the day, but has trouble completing tasks in a timely manner. Many chores he tried to accomplish take longer due  to his breathing. He is using his albuterol prn when the wheezing progresses. He is taking his symbicort daily.   No recent falls or injuries  He continues to receive dialysis care. At times he skips appointments. He states that the family stressors in his life have kept him from making some of his appointments. He is monitoring his fluid and sodium intake on a daily basis.   His right knee pain has progressed from the last visit. He has been using tylenol, tramadol, and capscacin cream for the pain. He is also using a knee brace for support.   Toenails are thick and discolored. He is having issues with wearing shoes.        Past Medical History:  Diagnosis Date  . Acute edema of lung, unspecified   . Acute, but ill-defined, cerebrovascular disease   . Allergy   . Anemia   . 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)   . Anxiety   . Asthma   . Asthma    moderate persistent  . Carpal tunnel syndrome   . Cellulitis and abscess of trunk   . Cholelithiasis 07/13/2014  . Chronic headaches   . Debility, unspecified   . Dermatophytosis of the body   . Dysrhythmia    history of  . Edema   . End stage renal disease on dialysis [facility])    "MWF; [facility]" (10/21/2014)  . Essential hypertension, benign   . GERD (gastroesophageal reflux disease)   . Gout, unspecified   . HTN (hypertension)   .  Hypertrophy of prostate without urinary obstruction and other lower urinary tract symptoms (LUTS)   . Hypotension, unspecified   . Impotence of organic origin   . Insomnia, unspecified   . Kidney replaced by transplant   . Localization-related (focal) (partial) epilepsy and epileptic syndromes with complex partial seizures, without mention of intractable epilepsy   . Lumbago   . Memory loss   . OSA on CPAP   . Other and unspecified hyperlipidemia   . Other chronic nonalcoholic liver disease   . Other malaise and fatigue   . Other nonspecific abnormal serum enzyme levels    . Pain in joint, lower leg   . Pain in joint, upper arm   . Pneumonia "several times"  . Renal dialysis status(V45.11) 02/05/2010   restarted 01/02/13 ofter renal trransplant failure  . Secondary hyperparathyroidism (of renal origin)   . Shortness of breath   . Sleep apnea   . Tension headache   . Unspecified constipation   . Unspecified essential hypertension   . Unspecified hereditary and idiopathic peripheral neuropathy   . Unspecified vitamin D deficiency     Past Surgical History:  Procedure Laterality Date  . AV FISTULA PLACEMENT Left ?2010   "forearm; at [facility]"  . BACK SURGERY    . CARDIAC CATHETERIZATION  03/21/2011  . CHOLECYSTECTOMY N/A 10/21/2014   Procedure: LAPAROSCOPIC CHOLECYSTECTOMY WITH INTRAOPERATIVE CHOLANGIOGRAM;  Surgeon: Autumn Messing III, MD;  Location: [facility];  Service: General;  Laterality: N/A;  . COLONOSCOPY    . 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 KIDNEY TRANSPLANT  08/17/2011   [place]  10/21/2014   w/IOC  . LEFT HEART CATHETERIZATION WITH CORONARY ANGIOGRAM N/A 03/21/2011   Procedure: LEFT HEART CATHETERIZATION WITH CORONARY ANGIOGRAM;  Surgeon: [name], MD;  Location: [facility] CATH LAB;  Service: Cardiovascular;  Laterality: N/A;  . NEPHRECTOMY  08/2013   removed transplaned kidney  . POSTERIOR FUSION CERVICAL SPINE  06/25/2012   for spinal stenosis  . VASECTOMY  2010    Allergies  Allergen Reactions  . Codeine Nausea And Vomiting    Outpatient Encounter Medications as of 08/28/2018  Medication Sig  . acetaminophen (TYLENOL) 500 MG tablet Take 1 tablet (500 mg total) by mouth every 6 (six) hours as needed for moderate pain.  Marland Kitchen albuterol (PROVENTIL HFA;VENTOLIN HFA) 108 (90 Base) MCG/ACT inhaler Inhale 2 puffs into the lungs every 6 (six) hours as needed for wheezing or shortness of breath.  Marland Kitchen aspirin EC 81 MG tablet Take 81 mg by mouth daily.  Marland Kitchen b complex-vitamin c-folic acid  (NEPHRO-VITE) 0.8 MG TABS tablet TAKE 1 TABLET BY MOUTH EVERY DAY  . budesonide-formoterol (SYMBICORT) 160-4.5 MCG/ACT inhaler Inhale 2 puffs into the lungs every 12 (twelve) hours.  . calcitRIOL (ROCALTROL) 0.5 MCG capsule Take 2 capsules (1 mcg total) by mouth every Monday, Wednesday, and Friday with hemodialysis.  . Cholecalciferol (VITAMIN D3) 5000 UNITS TABS Take 5,000 Units by mouth daily.   . clonazePAM (KLONOPIN) 0.5 MG tablet TAKE 1 TABLET (0.5 MG TOTAL) BY MOUTH 2 (TWO) TIMES DAILY AS NEEDED FOR ANXIETY.  . diphenhydrAMINE (BENADRYL) 25 MG tablet Take 50 mg by mouth at bedtime.   Marland Kitchen guaiFENesin (MUCINEX) 600 MG 12 hr tablet Take 1,200 mg by mouth 2 (two) times daily.  Marland Kitchen guaiFENesin-dextromethorphan (ROBITUSSIN DM) 100-10 MG/5ML syrup Take 10 mLs by mouth every 4 (four) hours as needed for cough.  . hydrALAZINE (APRESOLINE)  100 MG tablet Take 1 tablet (100 mg total) by mouth 3 (three) times daily.  . metoprolol tartrate (LOPRESSOR) 100 MG tablet Take 1 tablet (100 mg total) by mouth 2 (two) times daily.  . nicotine (NICODERM CQ - DOSED IN MG/24 HOURS) 21 mg/24hr patch Place 1 patch (21 mg total) onto the skin daily.  Marland Kitchen omeprazole (PRILOSEC) 20 MG capsule TAKE 1 CAPSULE TWICE DAILY BEFORE A MEAL  . ondansetron (ZOFRAN ODT) 4 MG disintegrating tablet Take 1 tablet (4 mg total) by mouth every 8 (eight) hours as needed for nausea or vomiting.  . pravastatin (PRAVACHOL) 40 MG tablet TAKE 1 TABLET EVERY EVENING  . PRESCRIPTION MEDICATION Inhale into the lungs at bedtime. CPAP  . sucroferric oxyhydroxide (VELPHORO) 500 MG chewable tablet Chew 500-1,500 mg by mouth See admin instructions. Chew 3 tablets (1500 mg) by mouth up to three times daily with meals and 1 tablet (500 mg) with snacks  . traMADol (ULTRAM) 50 MG tablet Take 1 tablet (50 mg total) by mouth daily as needed.  . cinacalcet (SENSIPAR) 30 MG tablet Take 1 tablet by mouth daily with supper.   No facility-administered encounter  medications on file as of 08/28/2018.     Review of Systems:  Review of Systems  Constitutional: Negative for activity change and appetite change.  HENT: Positive for dental problem and hearing loss.   Respiratory: Positive for cough, shortness of breath and wheezing.   Cardiovascular: Negative for chest pain, palpitations and leg swelling.  Gastrointestinal: Negative for constipation and diarrhea.  Genitourinary: Positive for dysuria. Negative for hematuria.  Musculoskeletal: Positive for arthralgias, back pain, gait problem and joint swelling.  Neurological: Positive for headaches. Negative for light-headedness.  Psychiatric/Behavioral: Positive for sleep disturbance. Negative for dysphoric mood. The patient is not nervous/anxious.     Health Maintenance  Topic Date Due  . OPHTHALMOLOGY EXAM  01/29/1972  . FOOT EXAM  04/29/2014  . TETANUS/TDAP  01/23/2016  . HEMOGLOBIN A1C  07/29/2018  . INFLUENZA VACCINE  08/23/2018  . COLONOSCOPY  08/30/2019  . PNEUMOCOCCAL POLYSACCHARIDE VACCINE AGE 69-64 HIGH RISK  Completed  . Hepatitis C Screening  Completed  . HIV Screening  Completed    Physical Exam: Vitals:   08/28/18 1422  BP: (!) 160/70  Pulse: 69  Temp: 98.3 F (36.8 C)  TempSrc: Oral  SpO2: 93%  Weight: 212 lb (96.2 kg)  Height: 5\' 7"  (1.702 m)   Body mass index is 33.2 kg/m. Physical Exam Vitals signs reviewed.  Constitutional:      General: He is not in acute distress.    Appearance: Normal appearance. He is normal weight. He is not ill-appearing.  HENT:     Head: Normocephalic.     Mouth/Throat:     Mouth: Mucous membranes are moist.  Cardiovascular:     Rate and Rhythm: Normal rate and regular rhythm.     Pulses: Normal pulses.     Heart sounds: Normal heart sounds. No murmur.  Pulmonary:     Effort: Pulmonary effort is normal. No respiratory distress.     Breath sounds: Normal breath sounds. No wheezing.  Abdominal:     General: Bowel sounds are normal.      Palpations: Abdomen is soft.  Musculoskeletal:        General: Tenderness present.     Right lower leg: No edema.     Left lower leg: No edema.     Comments: Right knee with FROM, no effusion or warmth. Tenderness  with palpation.   Feet:     Right foot:     Toenail Condition: Right toenails are abnormally thick. Fungal disease present.    Left foot:     Toenail Condition: Left toenails are abnormally thick. Fungal disease present. Skin:    General: Skin is warm and dry.     Capillary Refill: Capillary refill takes less than 2 seconds.  Neurological:     General: No focal deficit present.     Mental Status: He is alert and oriented to person, place, and time.     Sensory: No sensory deficit.     Coordination: Coordination normal.     Gait: Gait normal.  Psychiatric:        Mood and Affect: Mood normal.        Behavior: Behavior normal.     Labs reviewed: Basic Metabolic Panel: Recent Labs    01/28/18 0242  01/29/18 0655  06/02/18 0307 06/03/18 0528 08/12/18 0626 08/12/18 0745 08/13/18 0613  NA  --    < > 138   < > 143 137 140 139 135  K  --    < > 4.6   < > 4.2 4.4 4.3 4.1 3.6  CL  --    < > 99   < > 105 95* 100  --  96*  CO2  --    < > 30   < > 24 23 21*  --  23  GLUCOSE  --    < > 128*   < > 134* 176* 143*  --  121*  BUN  --    < > 20   < > 40* 40* 67*  --  46*  CREATININE  --    < > 7.98*   < > 14.16* 12.45* 19.59*  --  13.97*  CALCIUM  --    < > 9.8   < > 9.0 9.5 9.1  --  8.7*  MG  --   --  2.1  --   --   --   --   --   --   PHOS  --   --   --   --  8.4*  --   --   --  6.6*  TSH 0.787  --   --   --   --   --   --   --   --    < > = values in this interval not displayed.   Liver Function Tests: Recent Labs    06/01/18 2030 06/02/18 0307 06/02/18 1044 08/12/18 1644 08/13/18 0613  AST 24  --   --  27  --   ALT 19  --   --  35  --   ALKPHOS 59  --   --  87  --   BILITOT 1.5*  --  1.1 0.8  --   PROT 6.8  --   --  6.8  --   ALBUMIN 3.6 3.5  --  3.6 3.3*    No results for input(s): LIPASE, AMYLASE in the last 8760 hours. Recent Labs    08/12/18 1644  AMMONIA 34   CBC: Recent Labs    02/03/18 1523 05/14/18 1453 06/01/18 2030  08/12/18 0626 08/12/18 0745 08/13/18 0613 08/14/18 0351  WBC 5.8 4.6 5.1   < > 7.2  --  6.5 5.1  NEUTROABS 4,043 3.5 3.5  --   --   --   --   --   HGB  8.5* 9.6* 10.0*   < > 8.2* 12.9* 9.8* 9.8*  HCT 25.3* 31.0* 31.9*   < > 25.4* 38.0* 30.1* 30.1*  MCV 97.7 102.0* 100.3*   < > 98.1  --  98.0 96.8  PLT 131* 136* 101*   < > 80*  --  68* 77*   < > = values in this interval not displayed.   Lipid Panel: Recent Labs    01/28/18 0242  CHOL 106  HDL 23*  LDLCALC 51  TRIG 160*  CHOLHDL 4.6   Lab Results  Component Value Date   HGBA1C 5.1 01/28/2018    Procedures since last visit: Ct Head Wo Contrast  Result Date: 08/12/2018 CLINICAL DATA:  Altered mental status, headache EXAM: CT HEAD WITHOUT CONTRAST TECHNIQUE: Contiguous axial images were obtained from the base of the skull through the vertex without intravenous contrast. COMPARISON:  05/01/2017 FINDINGS: Brain: No evidence of acute infarction, hemorrhage, hydrocephalus, extra-axial collection or mass lesion/mass effect. Mild chronic low-density changes within the periventricular and subcortical white matter suggestive of chronic microvascular ischemic disease. Vascular: Scattered calcified atherosclerotic changes involving the large vessels at the skull base. No unexpected hyperdense vessel. Skull: Normal. Negative for fracture or focal lesion. Sinuses/Orbits: No acute finding. Other: None. IMPRESSION: No acute intracranial findings. Electronically Signed   By: Davina Poke M.D.   On: 08/12/2018 08:06   Dg Chest Portable 1 View  Result Date: 08/12/2018 CLINICAL DATA:  Shortness of breath. EXAM: PORTABLE CHEST 1 VIEW COMPARISON:  06/01/2018. FINDINGS: Cardiomegaly. Pulmonary venous congestion and bilateral interstitial prominence. No pleural effusion or  pneumothorax. Prior cervical spine fusion. IMPRESSION: Cardiomegaly with pulmonary venous congestion and bilateral interstitial prominence suggesting CHF. Pneumonitis cannot be excluded. Electronically Signed   By: Marcello Moores  Register   On: 08/12/2018 07:15    Assessment/Plan 1. Anxiety - ongoing, family stressors have increased anxiety in past months - clonazePAM (KLONOPIN) 0.5 MG tablet; Take 1 tablet (0.5 mg total) by mouth 2 (two) times daily as needed for anxiety.  Dispense: 60 tablet; Refill: 0  2. Tension headache - traMADol (ULTRAM) 50 MG tablet; Take 1 tablet (50 mg total) by mouth daily as needed.  Dispense: 30 tablet; Refill: 0 - ongoing, possibly related to uncontrolled hypertension -encourage patient to take new blood pressure medications - record daily blood pressures  3. Uncontrolled hypertension related to renal disorder - suggest following medications prescribed at discharge - increase metoprolol to 100 mg PO twice a day  - increase hydralazine to 100 mg three times a day - continue to limit fluids and sodium in diet - limit stressors and reduce stress - encourage smoking cessation  4. Nicotine dependence, cigarettes, complicated - continues to smoke due to stressors in his life - has tried to quit using nicotine patch, nicotine gum, and abrupt smoking cessation with no success - encourage smoking cessation   5. Moderate persistent asthma without complication - continue using albuterol prn -continue symbicort  -encourage smoking cessation  6. Bilateral primary osteoarthritis of knee - continue using capscacin, tylenol and tramadol for knee pain - continue using right knee brace for additional support - encourage rest and ice when knee pain increases  7. Type 2 diabetes mellitus with diabetic neuropathy, without longterm current use of insulin (HCC) - Last hemoglobin a1c 5.1 in 01/2018 - continue to limit sugars and carbs from diet  8. Onychomycosis of toenail  - toenails thick and brown in color with odor - podiatry consult recommended for trimming  9. [place]  syndrome -Initial cause of renal failure - past kidney transplant has failed requiring hemodialysis  10. End stage renal disease, on dialysis [facility]) - continues to receive dialysis, but misses appointments often -CBC with differential/platelets- future - Basic metabolic panel- future        Labs/tests ordered:  CBC with differential/platelets, basic metabolic panel - future Next appt:  09/25/2018

## 2018-08-29 ENCOUNTER — Telehealth (HOSPITAL_COMMUNITY): Payer: Self-pay

## 2018-08-29 DIAGNOSIS — Z992 Dependence on renal dialysis: Secondary | ICD-10-CM | POA: Diagnosis not present

## 2018-08-29 DIAGNOSIS — N2581 Secondary hyperparathyroidism of renal origin: Secondary | ICD-10-CM | POA: Diagnosis not present

## 2018-08-29 DIAGNOSIS — Z23 Encounter for immunization: Secondary | ICD-10-CM | POA: Diagnosis not present

## 2018-08-29 DIAGNOSIS — N186 End stage renal disease: Secondary | ICD-10-CM | POA: Diagnosis not present

## 2018-08-29 DIAGNOSIS — D631 Anemia in chronic kidney disease: Secondary | ICD-10-CM | POA: Diagnosis not present

## 2018-08-29 NOTE — Telephone Encounter (Signed)
Encounter complete. 

## 2018-09-01 DIAGNOSIS — Z23 Encounter for immunization: Secondary | ICD-10-CM | POA: Diagnosis not present

## 2018-09-01 DIAGNOSIS — Z992 Dependence on renal dialysis: Secondary | ICD-10-CM | POA: Diagnosis not present

## 2018-09-01 DIAGNOSIS — D631 Anemia in chronic kidney disease: Secondary | ICD-10-CM | POA: Diagnosis not present

## 2018-09-01 DIAGNOSIS — N186 End stage renal disease: Secondary | ICD-10-CM | POA: Diagnosis not present

## 2018-09-01 DIAGNOSIS — N2581 Secondary hyperparathyroidism of renal origin: Secondary | ICD-10-CM | POA: Diagnosis not present

## 2018-09-02 ENCOUNTER — Other Ambulatory Visit: Payer: Self-pay

## 2018-09-02 ENCOUNTER — Ambulatory Visit (HOSPITAL_COMMUNITY)
Admission: RE | Admit: 2018-09-02 | Discharge: 2018-09-02 | Disposition: A | Discharge status: Home / Self Care | Payer: Medicare Other | Source: Ambulatory Visit | Attending: Cardiovascular Disease | Admitting: Cardiovascular Disease

## 2018-09-02 ENCOUNTER — Other Ambulatory Visit: Payer: Self-pay | Admitting: *Deleted

## 2018-09-02 DIAGNOSIS — F419 Anxiety disorder, unspecified: Secondary | ICD-10-CM

## 2018-09-02 DIAGNOSIS — R079 Chest pain, unspecified: Secondary | ICD-10-CM | POA: Diagnosis not present

## 2018-09-02 LAB — MYOCARDIAL PERFUSION IMAGING
LV dias vol: 234 mL (ref 62–150)
LV sys vol: 146 mL
Peak HR: 93 {beats}/min
Rest HR: 74 {beats}/min
SDS: 1
SRS: 2
SSS: 3
TID: 1.06

## 2018-09-02 MED ORDER — CLONAZEPAM 0.5 MG PO TABS: 0.5000 mg | ORAL_TABLET | Freq: Two times a day (BID) | ORAL | 0 refills | Status: DC | PRN

## 2018-09-02 MED ORDER — REGADENOSON 0.4 MG/5ML IV SOLN
0.4000 mg | Freq: Once | INTRAVENOUS | Status: AC
  Administered 2018-09-02: 0.4 mg via INTRAVENOUS

## 2018-09-02 MED ORDER — TECHNETIUM TC 99M TETROFOSMIN IV KIT
30.1000 | PACK | Freq: Once | INTRAVENOUS | Status: AC | PRN
  Administered 2018-09-02: 30.1 via INTRAVENOUS
  Filled 2018-09-02: qty 31

## 2018-09-02 MED ORDER — AMINOPHYLLINE 25 MG/ML IV SOLN
75.0000 mg | Freq: Once | INTRAVENOUS | Status: AC
  Administered 2018-09-02: 75 mg via INTRAVENOUS

## 2018-09-02 MED ORDER — TECHNETIUM TC 99M TETROFOSMIN IV KIT
9.8000 | PACK | Freq: Once | INTRAVENOUS | Status: AC | PRN
  Administered 2018-09-02: 9.8 via INTRAVENOUS
  Filled 2018-09-02: qty 10

## 2018-09-02 NOTE — Telephone Encounter (Signed)
Patient wife called requesting Rx. Stated that Dr. Mariea Clonts called in the Tramadol but patient also needed the Clonazepam. Reviewed chart and Rx was sent to Mail order. Called pharmacy.

## 2018-09-03 DIAGNOSIS — D631 Anemia in chronic kidney disease: Secondary | ICD-10-CM | POA: Diagnosis not present

## 2018-09-03 DIAGNOSIS — Z992 Dependence on renal dialysis: Secondary | ICD-10-CM | POA: Diagnosis not present

## 2018-09-03 DIAGNOSIS — N186 End stage renal disease: Secondary | ICD-10-CM | POA: Diagnosis not present

## 2018-09-03 DIAGNOSIS — N2581 Secondary hyperparathyroidism of renal origin: Secondary | ICD-10-CM | POA: Diagnosis not present

## 2018-09-03 DIAGNOSIS — Z23 Encounter for immunization: Secondary | ICD-10-CM | POA: Diagnosis not present

## 2018-09-05 DIAGNOSIS — Z992 Dependence on renal dialysis: Secondary | ICD-10-CM | POA: Diagnosis not present

## 2018-09-05 DIAGNOSIS — N2581 Secondary hyperparathyroidism of renal origin: Secondary | ICD-10-CM | POA: Diagnosis not present

## 2018-09-05 DIAGNOSIS — N186 End stage renal disease: Secondary | ICD-10-CM | POA: Diagnosis not present

## 2018-09-05 DIAGNOSIS — D631 Anemia in chronic kidney disease: Secondary | ICD-10-CM | POA: Diagnosis not present

## 2018-09-05 DIAGNOSIS — Z23 Encounter for immunization: Secondary | ICD-10-CM | POA: Diagnosis not present

## 2018-09-08 DIAGNOSIS — N186 End stage renal disease: Secondary | ICD-10-CM | POA: Diagnosis not present

## 2018-09-08 DIAGNOSIS — Z992 Dependence on renal dialysis: Secondary | ICD-10-CM | POA: Diagnosis not present

## 2018-09-08 DIAGNOSIS — N2581 Secondary hyperparathyroidism of renal origin: Secondary | ICD-10-CM | POA: Diagnosis not present

## 2018-09-08 DIAGNOSIS — D631 Anemia in chronic kidney disease: Secondary | ICD-10-CM | POA: Diagnosis not present

## 2018-09-08 DIAGNOSIS — Z23 Encounter for immunization: Secondary | ICD-10-CM | POA: Diagnosis not present

## 2018-09-10 ENCOUNTER — Other Ambulatory Visit: Payer: Self-pay | Admitting: Physician Assistant

## 2018-09-10 DIAGNOSIS — N2581 Secondary hyperparathyroidism of renal origin: Secondary | ICD-10-CM | POA: Diagnosis not present

## 2018-09-10 DIAGNOSIS — Z992 Dependence on renal dialysis: Secondary | ICD-10-CM | POA: Diagnosis not present

## 2018-09-10 DIAGNOSIS — N186 End stage renal disease: Secondary | ICD-10-CM | POA: Diagnosis not present

## 2018-09-10 DIAGNOSIS — Z23 Encounter for immunization: Secondary | ICD-10-CM | POA: Diagnosis not present

## 2018-09-10 DIAGNOSIS — I519 Heart disease, unspecified: Secondary | ICD-10-CM

## 2018-09-10 DIAGNOSIS — D631 Anemia in chronic kidney disease: Secondary | ICD-10-CM | POA: Diagnosis not present

## 2018-09-11 ENCOUNTER — Other Ambulatory Visit: Payer: Self-pay

## 2018-09-11 ENCOUNTER — Ambulatory Visit (INDEPENDENT_AMBULATORY_CARE_PROVIDER_SITE_OTHER): Payer: Medicare Other | Admitting: Podiatry

## 2018-09-11 VITALS — Temp 98.5°F

## 2018-09-11 DIAGNOSIS — Z992 Dependence on renal dialysis: Secondary | ICD-10-CM

## 2018-09-11 DIAGNOSIS — M79676 Pain in unspecified toe(s): Secondary | ICD-10-CM | POA: Diagnosis not present

## 2018-09-11 DIAGNOSIS — B351 Tinea unguium: Secondary | ICD-10-CM | POA: Diagnosis not present

## 2018-09-11 DIAGNOSIS — N186 End stage renal disease: Secondary | ICD-10-CM

## 2018-09-11 MED ORDER — CICLOPIROX 8 % EX SOLN: Freq: Every day | CUTANEOUS | 0 refills | Status: DC

## 2018-09-12 DIAGNOSIS — Z23 Encounter for immunization: Secondary | ICD-10-CM | POA: Diagnosis not present

## 2018-09-12 DIAGNOSIS — D631 Anemia in chronic kidney disease: Secondary | ICD-10-CM | POA: Diagnosis not present

## 2018-09-12 DIAGNOSIS — Z992 Dependence on renal dialysis: Secondary | ICD-10-CM | POA: Diagnosis not present

## 2018-09-12 DIAGNOSIS — N2581 Secondary hyperparathyroidism of renal origin: Secondary | ICD-10-CM | POA: Diagnosis not present

## 2018-09-12 DIAGNOSIS — N186 End stage renal disease: Secondary | ICD-10-CM | POA: Diagnosis not present

## 2018-09-15 DIAGNOSIS — N186 End stage renal disease: Secondary | ICD-10-CM | POA: Diagnosis not present

## 2018-09-15 DIAGNOSIS — Z23 Encounter for immunization: Secondary | ICD-10-CM | POA: Diagnosis not present

## 2018-09-15 DIAGNOSIS — Z992 Dependence on renal dialysis: Secondary | ICD-10-CM | POA: Diagnosis not present

## 2018-09-15 DIAGNOSIS — D631 Anemia in chronic kidney disease: Secondary | ICD-10-CM | POA: Diagnosis not present

## 2018-09-15 DIAGNOSIS — N2581 Secondary hyperparathyroidism of renal origin: Secondary | ICD-10-CM | POA: Diagnosis not present

## 2018-09-16 ENCOUNTER — Other Ambulatory Visit: Payer: Self-pay

## 2018-09-16 ENCOUNTER — Ambulatory Visit (HOSPITAL_BASED_OUTPATIENT_CLINIC_OR_DEPARTMENT_OTHER): Payer: Medicare Other

## 2018-09-16 ENCOUNTER — Telehealth: Payer: Self-pay

## 2018-09-16 DIAGNOSIS — R0602 Shortness of breath: Secondary | ICD-10-CM | POA: Diagnosis not present

## 2018-09-16 DIAGNOSIS — I110 Hypertensive heart disease with heart failure: Secondary | ICD-10-CM | POA: Diagnosis not present

## 2018-09-16 DIAGNOSIS — N2581 Secondary hyperparathyroidism of renal origin: Secondary | ICD-10-CM | POA: Diagnosis not present

## 2018-09-16 DIAGNOSIS — I519 Heart disease, unspecified: Secondary | ICD-10-CM | POA: Insufficient documentation

## 2018-09-16 DIAGNOSIS — E8770 Fluid overload, unspecified: Secondary | ICD-10-CM | POA: Diagnosis not present

## 2018-09-16 DIAGNOSIS — I509 Heart failure, unspecified: Secondary | ICD-10-CM | POA: Diagnosis not present

## 2018-09-16 DIAGNOSIS — Z940 Kidney transplant status: Secondary | ICD-10-CM | POA: Diagnosis not present

## 2018-09-16 DIAGNOSIS — N186 End stage renal disease: Secondary | ICD-10-CM | POA: Diagnosis not present

## 2018-09-16 DIAGNOSIS — I132 Hypertensive heart and chronic kidney disease with heart failure and with stage 5 chronic kidney disease, or end stage renal disease: Secondary | ICD-10-CM | POA: Diagnosis not present

## 2018-09-16 DIAGNOSIS — Z20828 Contact with and (suspected) exposure to other viral communicable diseases: Secondary | ICD-10-CM | POA: Diagnosis not present

## 2018-09-16 NOTE — Telephone Encounter (Signed)
The patient came in for an Echo this morning and is just not feeling well.  I assessed the patient after his Echo.  His BP was 167/85 HR 66 Blood Sugar 117.  He has not eaten this morning and is on dialysis M-W-F.    He denies symptoms of CP or SOB.  He has been tired, weak and not sleeping well.   He decided to go home, eat and rest.  He will call on 8/26 with an update of status.  If things worsen, he will go to the ED.

## 2018-09-17 ENCOUNTER — Emergency Department (HOSPITAL_COMMUNITY): Payer: Medicare Other

## 2018-09-17 ENCOUNTER — Inpatient Hospital Stay (HOSPITAL_COMMUNITY)
Admission: EM | Admit: 2018-09-17 | Discharge: 2018-09-19 | DRG: 640 | Disposition: A | Discharge status: Home / Self Care | Payer: Medicare Other | Source: Ambulatory Visit | Attending: Internal Medicine | Admitting: Internal Medicine

## 2018-09-17 ENCOUNTER — Other Ambulatory Visit: Payer: Self-pay

## 2018-09-17 ENCOUNTER — Encounter (HOSPITAL_COMMUNITY): Payer: Self-pay | Admitting: Emergency Medicine

## 2018-09-17 DIAGNOSIS — Z23 Encounter for immunization: Secondary | ICD-10-CM | POA: Diagnosis not present

## 2018-09-17 DIAGNOSIS — J4540 Moderate persistent asthma, uncomplicated: Secondary | ICD-10-CM | POA: Diagnosis present

## 2018-09-17 DIAGNOSIS — I110 Hypertensive heart disease with heart failure: Secondary | ICD-10-CM | POA: Diagnosis not present

## 2018-09-17 DIAGNOSIS — N186 End stage renal disease: Secondary | ICD-10-CM | POA: Diagnosis not present

## 2018-09-17 DIAGNOSIS — Z992 Dependence on renal dialysis: Secondary | ICD-10-CM

## 2018-09-17 DIAGNOSIS — R410 Disorientation, unspecified: Secondary | ICD-10-CM | POA: Diagnosis not present

## 2018-09-17 DIAGNOSIS — N400 Enlarged prostate without lower urinary tract symptoms: Secondary | ICD-10-CM | POA: Diagnosis present

## 2018-09-17 DIAGNOSIS — E662 Morbid (severe) obesity with alveolar hypoventilation: Secondary | ICD-10-CM | POA: Diagnosis present

## 2018-09-17 DIAGNOSIS — Z6835 Body mass index (BMI) 35.0-35.9, adult: Secondary | ICD-10-CM | POA: Diagnosis not present

## 2018-09-17 DIAGNOSIS — I10 Essential (primary) hypertension: Secondary | ICD-10-CM | POA: Diagnosis not present

## 2018-09-17 DIAGNOSIS — E8770 Fluid overload, unspecified: Principal | ICD-10-CM | POA: Diagnosis present

## 2018-09-17 DIAGNOSIS — Z7982 Long term (current) use of aspirin: Secondary | ICD-10-CM

## 2018-09-17 DIAGNOSIS — M898X9 Other specified disorders of bone, unspecified site: Secondary | ICD-10-CM | POA: Diagnosis present

## 2018-09-17 DIAGNOSIS — D61818 Other pancytopenia: Secondary | ICD-10-CM | POA: Diagnosis not present

## 2018-09-17 DIAGNOSIS — E785 Hyperlipidemia, unspecified: Secondary | ICD-10-CM | POA: Diagnosis present

## 2018-09-17 DIAGNOSIS — Z20828 Contact with and (suspected) exposure to other viral communicable diseases: Secondary | ICD-10-CM | POA: Diagnosis not present

## 2018-09-17 DIAGNOSIS — R0602 Shortness of breath: Secondary | ICD-10-CM | POA: Diagnosis not present

## 2018-09-17 DIAGNOSIS — I132 Hypertensive heart and chronic kidney disease with heart failure and with stage 5 chronic kidney disease, or end stage renal disease: Secondary | ICD-10-CM | POA: Diagnosis present

## 2018-09-17 DIAGNOSIS — Z940 Kidney transplant status: Secondary | ICD-10-CM | POA: Diagnosis not present

## 2018-09-17 DIAGNOSIS — Z7951 Long term (current) use of inhaled steroids: Secondary | ICD-10-CM

## 2018-09-17 DIAGNOSIS — G609 Hereditary and idiopathic neuropathy, unspecified: Secondary | ICD-10-CM | POA: Diagnosis present

## 2018-09-17 DIAGNOSIS — I160 Hypertensive urgency: Secondary | ICD-10-CM | POA: Diagnosis not present

## 2018-09-17 DIAGNOSIS — E559 Vitamin D deficiency, unspecified: Secondary | ICD-10-CM | POA: Diagnosis present

## 2018-09-17 DIAGNOSIS — K219 Gastro-esophageal reflux disease without esophagitis: Secondary | ICD-10-CM | POA: Diagnosis present

## 2018-09-17 DIAGNOSIS — I5032 Chronic diastolic (congestive) heart failure: Secondary | ICD-10-CM | POA: Diagnosis present

## 2018-09-17 DIAGNOSIS — Z8701 Personal history of pneumonia (recurrent): Secondary | ICD-10-CM

## 2018-09-17 DIAGNOSIS — R4182 Altered mental status, unspecified: Secondary | ICD-10-CM | POA: Diagnosis not present

## 2018-09-17 DIAGNOSIS — Z9049 Acquired absence of other specified parts of digestive tract: Secondary | ICD-10-CM

## 2018-09-17 DIAGNOSIS — N2581 Secondary hyperparathyroidism of renal origin: Secondary | ICD-10-CM | POA: Diagnosis not present

## 2018-09-17 DIAGNOSIS — F419 Anxiety disorder, unspecified: Secondary | ICD-10-CM | POA: Diagnosis present

## 2018-09-17 DIAGNOSIS — Z905 Acquired absence of kidney: Secondary | ICD-10-CM

## 2018-09-17 DIAGNOSIS — Z8673 Personal history of transient ischemic attack (TIA), and cerebral infarction without residual deficits: Secondary | ICD-10-CM

## 2018-09-17 DIAGNOSIS — I509 Heart failure, unspecified: Secondary | ICD-10-CM

## 2018-09-17 DIAGNOSIS — Z9852 Vasectomy status: Secondary | ICD-10-CM | POA: Diagnosis not present

## 2018-09-17 DIAGNOSIS — F17210 Nicotine dependence, cigarettes, uncomplicated: Secondary | ICD-10-CM | POA: Diagnosis present

## 2018-09-17 DIAGNOSIS — Z79899 Other long term (current) drug therapy: Secondary | ICD-10-CM

## 2018-09-17 DIAGNOSIS — Z981 Arthrodesis status: Secondary | ICD-10-CM | POA: Diagnosis not present

## 2018-09-17 DIAGNOSIS — D631 Anemia in chronic kidney disease: Secondary | ICD-10-CM | POA: Diagnosis not present

## 2018-09-17 DIAGNOSIS — I120 Hypertensive chronic kidney disease with stage 5 chronic kidney disease or end stage renal disease: Secondary | ICD-10-CM | POA: Diagnosis not present

## 2018-09-17 DIAGNOSIS — Z885 Allergy status to narcotic agent status: Secondary | ICD-10-CM

## 2018-09-17 DIAGNOSIS — Z6832 Body mass index (BMI) 32.0-32.9, adult: Secondary | ICD-10-CM

## 2018-09-17 DIAGNOSIS — E669 Obesity, unspecified: Secondary | ICD-10-CM | POA: Diagnosis present

## 2018-09-17 LAB — BASIC METABOLIC PANEL
Anion gap: 12 (ref 5–15)
BUN: 24 mg/dL — ABNORMAL HIGH (ref 6–20)
CO2: 32 mmol/L (ref 22–32)
Calcium: 8.6 mg/dL — ABNORMAL LOW (ref 8.9–10.3)
Chloride: 97 mmol/L — ABNORMAL LOW (ref 98–111)
Creatinine, Ser: 7.6 mg/dL — ABNORMAL HIGH (ref 0.61–1.24)
GFR calc Af Amer: 8 mL/min — ABNORMAL LOW (ref >60)
GFR calc non Af Amer: 7 mL/min — ABNORMAL LOW (ref >60)
Glucose, Bld: 126 mg/dL — ABNORMAL HIGH (ref 70–99)
Potassium: 3.7 mmol/L (ref 3.5–5.1)
Sodium: 141 mmol/L (ref 135–145)

## 2018-09-17 LAB — CBC
HCT: 24.9 % — ABNORMAL LOW (ref 39.0–52.0)
Hemoglobin: 7.9 g/dL — ABNORMAL LOW (ref 13.0–17.0)
MCH: 32 pg (ref 26.0–34.0)
MCHC: 31.7 g/dL (ref 30.0–36.0)
MCV: 100.8 fL — ABNORMAL HIGH (ref 80.0–100.0)
Platelets: ADEQUATE 10*3/uL (ref 150–400)
RBC: 2.47 MIL/uL — ABNORMAL LOW (ref 4.22–5.81)
RDW: 14.5 % (ref 11.5–15.5)
WBC: 3.2 10*3/uL — ABNORMAL LOW (ref 4.0–10.5)
nRBC: 0 % (ref 0.0–0.2)

## 2018-09-17 LAB — SARS CORONAVIRUS 2 BY RT PCR (HOSPITAL ORDER, PERFORMED IN ~~LOC~~ HOSPITAL LAB): SARS Coronavirus 2: NEGATIVE

## 2018-09-17 LAB — BRAIN NATRIURETIC PEPTIDE: B Natriuretic Peptide: 1683.9 pg/mL — ABNORMAL HIGH (ref 0.0–100.0)

## 2018-09-17 LAB — TROPONIN I (HIGH SENSITIVITY)
Troponin I (High Sensitivity): 16 ng/L (ref <18)
Troponin I (High Sensitivity): 17 ng/L (ref <18)

## 2018-09-17 LAB — MRSA PCR SCREENING: MRSA by PCR: NEGATIVE

## 2018-09-17 MED ORDER — TRAMADOL HCL 50 MG PO TABS: 50.0000 mg | ORAL_TABLET | Freq: Every day | ORAL | Status: DC | PRN

## 2018-09-17 MED ORDER — ASPIRIN EC 81 MG PO TBEC
81.0000 mg | DELAYED_RELEASE_TABLET | Freq: Every day | ORAL | Status: DC
  Administered 2018-09-18: 81 mg via ORAL
  Filled 2018-09-17: qty 1

## 2018-09-17 MED ORDER — HYDRALAZINE HCL 50 MG PO TABS
100.0000 mg | ORAL_TABLET | Freq: Three times a day (TID) | ORAL | Status: DC
  Administered 2018-09-17 – 2018-09-18 (×4): 100 mg via ORAL
  Filled 2018-09-17 (×4): qty 2

## 2018-09-17 MED ORDER — SUCROFERRIC OXYHYDROXIDE 500 MG PO CHEW
500.0000 mg | CHEWABLE_TABLET | ORAL | Status: DC | PRN
  Filled 2018-09-17: qty 1

## 2018-09-17 MED ORDER — PANTOPRAZOLE SODIUM 40 MG PO TBEC
40.0000 mg | DELAYED_RELEASE_TABLET | Freq: Two times a day (BID) | ORAL | Status: DC
  Administered 2018-09-17 – 2018-09-19 (×4): 40 mg via ORAL
  Filled 2018-09-17 (×4): qty 1

## 2018-09-17 MED ORDER — ACETAMINOPHEN 325 MG PO TABS: 650.0000 mg | ORAL_TABLET | Freq: Four times a day (QID) | ORAL | Status: DC | PRN

## 2018-09-17 MED ORDER — SODIUM CHLORIDE 0.9% FLUSH: 3.0000 mL | Freq: Two times a day (BID) | INTRAVENOUS | Status: DC

## 2018-09-17 MED ORDER — GUAIFENESIN ER 600 MG PO TB12
1200.0000 mg | ORAL_TABLET | Freq: Two times a day (BID) | ORAL | Status: DC
  Administered 2018-09-17 – 2018-09-18 (×2): 1200 mg via ORAL
  Filled 2018-09-17 (×2): qty 2

## 2018-09-17 MED ORDER — PRAVASTATIN SODIUM 40 MG PO TABS
40.0000 mg | ORAL_TABLET | Freq: Every evening | ORAL | Status: DC
  Administered 2018-09-17 – 2018-09-18 (×2): 40 mg via ORAL
  Filled 2018-09-17 (×2): qty 1

## 2018-09-17 MED ORDER — ARFORMOTEROL TARTRATE 15 MCG/2ML IN NEBU
15.0000 ug | INHALATION_SOLUTION | Freq: Two times a day (BID) | RESPIRATORY_TRACT | Status: DC
  Administered 2018-09-17 – 2018-09-19 (×4): 15 ug via RESPIRATORY_TRACT
  Filled 2018-09-17 (×4): qty 2

## 2018-09-17 MED ORDER — CICLOPIROX 8 % EX SOLN: Freq: Every day | CUTANEOUS | Status: DC

## 2018-09-17 MED ORDER — BUDESONIDE 0.5 MG/2ML IN SUSP
0.5000 mg | Freq: Two times a day (BID) | RESPIRATORY_TRACT | Status: DC
  Administered 2018-09-17 – 2018-09-19 (×4): 0.5 mg via RESPIRATORY_TRACT
  Filled 2018-09-17 (×4): qty 2

## 2018-09-17 MED ORDER — ONDANSETRON HCL 4 MG/2ML IJ SOLN: 4.0000 mg | Freq: Four times a day (QID) | INTRAMUSCULAR | Status: DC | PRN

## 2018-09-17 MED ORDER — DIPHENHYDRAMINE HCL 25 MG PO CAPS
25.0000 mg | ORAL_CAPSULE | Freq: Three times a day (TID) | ORAL | Status: DC | PRN
  Administered 2018-09-17 – 2018-09-18 (×2): 25 mg via ORAL
  Filled 2018-09-17 (×2): qty 1

## 2018-09-17 MED ORDER — CINACALCET HCL 30 MG PO TABS
90.0000 mg | ORAL_TABLET | Freq: Every day | ORAL | Status: DC
  Administered 2018-09-18: 90 mg via ORAL
  Filled 2018-09-17 (×2): qty 3

## 2018-09-17 MED ORDER — HEPARIN SODIUM (PORCINE) 5000 UNIT/ML IJ SOLN
5000.0000 [IU] | Freq: Three times a day (TID) | INTRAMUSCULAR | Status: DC
  Filled 2018-09-17 (×2): qty 1

## 2018-09-17 MED ORDER — METOPROLOL TARTRATE 100 MG PO TABS
100.0000 mg | ORAL_TABLET | Freq: Two times a day (BID) | ORAL | Status: DC
  Administered 2018-09-17 – 2018-09-18 (×2): 100 mg via ORAL
  Filled 2018-09-17 (×2): qty 1

## 2018-09-17 MED ORDER — SEVELAMER CARBONATE 800 MG PO TABS
2400.0000 mg | ORAL_TABLET | Freq: Three times a day (TID) | ORAL | Status: DC
  Administered 2018-09-18 – 2018-09-19 (×3): 2400 mg via ORAL
  Filled 2018-09-17 (×4): qty 3

## 2018-09-17 MED ORDER — CLONAZEPAM 0.5 MG PO TABS
0.5000 mg | ORAL_TABLET | Freq: Two times a day (BID) | ORAL | Status: DC | PRN
  Administered 2018-09-17 (×2): 0.5 mg via ORAL
  Filled 2018-09-17 (×2): qty 1

## 2018-09-17 MED ORDER — CHLORHEXIDINE GLUCONATE CLOTH 2 % EX PADS: 6.0000 | MEDICATED_PAD | Freq: Every day | CUTANEOUS | Status: DC

## 2018-09-17 MED ORDER — ONDANSETRON HCL 4 MG PO TABS: 4.0000 mg | ORAL_TABLET | Freq: Four times a day (QID) | ORAL | Status: DC | PRN

## 2018-09-17 MED ORDER — CALCITRIOL 0.5 MCG PO CAPS: 1.0000 ug | ORAL_CAPSULE | ORAL | Status: DC

## 2018-09-17 MED ORDER — SUCROFERRIC OXYHYDROXIDE 500 MG PO CHEW
1500.0000 mg | CHEWABLE_TABLET | Freq: Three times a day (TID) | ORAL | Status: DC
  Filled 2018-09-17: qty 3

## 2018-09-17 MED ORDER — ACETAMINOPHEN 650 MG RE SUPP: 650.0000 mg | Freq: Four times a day (QID) | RECTAL | Status: DC | PRN

## 2018-09-17 MED ORDER — ALBUTEROL SULFATE (2.5 MG/3ML) 0.083% IN NEBU: 2.5000 mg | INHALATION_SOLUTION | RESPIRATORY_TRACT | Status: DC | PRN

## 2018-09-17 MED ORDER — DOXERCALCIFEROL 4 MCG/2ML IV SOLN
4.0000 ug | INTRAVENOUS | Status: DC
  Filled 2018-09-17: qty 2

## 2018-09-17 MED ORDER — NITROGLYCERIN 2 % TD OINT
1.0000 [in_us] | TOPICAL_OINTMENT | Freq: Once | TRANSDERMAL | Status: AC
  Administered 2018-09-17: 17:00:00 1 [in_us] via TOPICAL
  Filled 2018-09-17: qty 1

## 2018-09-17 NOTE — Telephone Encounter (Signed)
Routed to Dr. Debara Pickett as Kirk [name]

## 2018-09-17 NOTE — Progress Notes (Signed)
Placed patient on CPAP for the night as per home settings of 10cm. Oxygen set at 2lpm.

## 2018-09-17 NOTE — ED Provider Notes (Signed)
[facility] EMERGENCY DEPARTMENT Provider Note   CSN: [id number] Arrival date & time: 09/17/18  1049     History   Chief Complaint Chief Complaint  Patient presents with  . Shortness of Breath    HPI Kirk [name] is a 58 y.o. male.     58yo male with history of ESRD (dialysis M/W/F, went for 1/2 dialysis treatment today- stopped due to chest pain), brought in by EMS from dialysis for shortness of breath, history provided by wife via cell phone- weakness for the past 3 weeks, progressively worsening, SHOB, coughing up foamy phlegm- constant, fall on Saturday while walking up on the porch, states he fell due to weakness, no injuries from the fall. Quit smoking 1 week ago, coughing more now that when he was smoking. Uses CPAP at night, not on oxygen otherwise. No fevers. Last admitted to the hospital in July, negative COVID. Seen yesterday by cardiology for echo, advised he did not feel well at that time. Reports chest heaviness for several years, states maybe more intense now.      Past Medical History:  Diagnosis Date  . Acute edema of lung, unspecified   . Acute, but ill-defined, cerebrovascular disease   . Allergy   . Anemia   . 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)   . Anxiety   . Asthma   . Asthma    moderate persistent  . Carpal tunnel syndrome   . Cellulitis and abscess of trunk   . Cholelithiasis 07/13/2014  . Chronic headaches   . Debility, unspecified   . Dermatophytosis of the body   . Dysrhythmia    history of  . Edema   . End stage renal disease on dialysis [facility])    "MWF; [facility]" (10/21/2014)  . Essential hypertension, benign   . GERD (gastroesophageal reflux disease)   . Gout, unspecified   . HTN (hypertension)   . Hypertrophy of prostate without urinary obstruction and other lower urinary tract symptoms (LUTS)   . Hypotension, unspecified   . Impotence of organic origin   . Insomnia, unspecified   .  Kidney replaced by transplant   . Localization-related (focal) (partial) epilepsy and epileptic syndromes with complex partial seizures, without mention of intractable epilepsy   . Lumbago   . Memory loss   . OSA on CPAP   . Other and unspecified hyperlipidemia   . Other chronic nonalcoholic liver disease   . Other malaise and fatigue   . Other nonspecific abnormal serum enzyme levels   . Pain in joint, lower leg   . Pain in joint, upper arm   . Pneumonia "several times"  . Renal dialysis status(V45.11) 02/05/2010   restarted 01/02/13 ofter renal trransplant failure  . Secondary hyperparathyroidism (of renal origin)   . Shortness of breath   . Sleep apnea   . Tension headache   . Unspecified constipation   . Unspecified essential hypertension   . Unspecified hereditary and idiopathic peripheral neuropathy   . Unspecified vitamin D deficiency     Patient Active Problem List   Diagnosis Date Noted  . Right knee pain 06/02/2018  . Right tibial fracture 06/02/2018  . Cigarette smoker 07/11/2017  . Hypokalemia   . 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  . Shunt malfunction 07/03/2016  . Need for hepatitis C screening test 06/29/2015  . Myalgia 05/17/2015  . 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 Obesity hypoventilation syndrome ([place]) 12/09/2014  . Narcotic drug use 12/09/2014  .  Hypersomnia with sleep apnea 12/09/2014  . SCC (squamous cell carcinoma), arm 11/09/2014  . Mild persistent chronic asthma without complication 92/11/9415  . ESRD on dialysis ([place]) 09/09/2014  . HLD (hyperlipidemia) 09/09/2014  . Pancytopenia ([place]) 04/28/2014  . Thrombocytopenia ([place]) 04/10/2014  . Fungal dermatitis 03/17/2014  . S/p nephrectomy 09/17/2013  . Anemia 09/03/2012  . Uncontrolled hypertension   . Tension headache   . Memory loss   . Edema   . Thoracic or lumbosacral neuritis or radiculitis 03/27/2012  . Nerve root pain 03/27/2012  . History of kidney transplant 09/10/2011  . Hearing loss  08/16/2011  . Obesity 08/16/2011  . OSA on CPAP 08/05/2011  . GIB (gastrointestinal bleeding) 10/28/2007    Past Surgical History:  Procedure Laterality Date  . AV FISTULA PLACEMENT Left ?2010   "forearm; at [facility]"  . BACK SURGERY    . CARDIAC CATHETERIZATION  03/21/2011  . CHOLECYSTECTOMY N/A 10/21/2014   Procedure: LAPAROSCOPIC CHOLECYSTECTOMY WITH INTRAOPERATIVE CHOLANGIOGRAM;  Surgeon: Autumn Messing III, MD;  Location: [facility];  Service: General;  Laterality: N/A;  . COLONOSCOPY    . 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 KIDNEY TRANSPLANT  08/17/2011   [place]  10/21/2014   w/IOC  . LEFT HEART CATHETERIZATION WITH CORONARY ANGIOGRAM N/A 03/21/2011   Procedure: LEFT HEART CATHETERIZATION WITH CORONARY ANGIOGRAM;  Surgeon: [name], MD;  Location: [facility] CATH LAB;  Service: Cardiovascular;  Laterality: N/A;  . NEPHRECTOMY  08/2013   removed transplaned kidney  . POSTERIOR FUSION CERVICAL SPINE  06/25/2012   for spinal stenosis  . VASECTOMY  2010        Home Medications    Prior to Admission medications   Medication Sig Start Date End Date Taking? Authorizing Provider  acetaminophen (TYLENOL) 500 MG tablet Take 1 tablet (500 mg total) by mouth every 6 (six) hours as needed for moderate pain. 09/12/14  Yes Samuella Cota, MD  albuterol (PROVENTIL HFA;VENTOLIN HFA) 108 (90 Base) MCG/ACT inhaler Inhale 2 puffs into the lungs every 6 (six) hours as needed for wheezing or shortness of breath. 06/05/17  Yes Patrecia Pour, MD  aspirin EC 81 MG tablet Take 81 mg by mouth daily.   Yes [provider]  b complex-vitamin c-folic acid (NEPHRO-VITE) 0.8 MG TABS tablet TAKE 1 TABLET BY MOUTH EVERY DAY 05/29/18  Yes Reed, Tiffany L, DO  budesonide-formoterol (SYMBICORT) 160-4.5 MCG/ACT inhaler Inhale 2 puffs into the lungs every 12 (twelve) hours. 07/08/18  Yes Tanda Rockers, MD  calcitRIOL (ROCALTROL) 0.5 MCG capsule  Take 2 capsules (1 mcg total) by mouth every Monday, Wednesday, and Friday with hemodialysis. 06/04/18  Yes Black, Lezlie Octave, NP  Cholecalciferol (VITAMIN D3) 5000 UNITS TABS Take 5,000 Units by mouth daily.    Yes [provider]  ciclopirox (PENLAC) 8 % solution Apply topically at bedtime. Apply over nail and surrounding skin. Apply daily over previous coat. Remove weekly with file or polish remover. 09/11/18  Yes Evelina Bucy, DPM  cinacalcet (SENSIPAR) 30 MG tablet Take 90 mg by mouth daily with supper.  08/12/18  Yes [provider]  clonazePAM (KLONOPIN) 0.5 MG tablet Take 1 tablet (0.5 mg total) by mouth 2 (two) times daily as needed for anxiety. 09/02/18  Yes Reed, Tiffany L, DO  diphenhydrAMINE (BENADRYL) 25 MG tablet Take 50 mg by mouth at bedtime.    Yes [provider]  guaiFENesin [facility])  600 MG 12 hr tablet Take 1,200 mg by mouth 2 (two) times daily.   Yes [provider]  guaiFENesin-dextromethorphan (ROBITUSSIN DM) 100-10 MG/5ML syrup Take 10 mLs by mouth every 4 (four) hours as needed for cough.   Yes [provider]  hydrALAZINE (APRESOLINE) 100 MG tablet Take 1 tablet (100 mg total) by mouth 3 (three) times daily. 08/28/18  Yes Reed, Tiffany L, DO  metoprolol tartrate (LOPRESSOR) 100 MG tablet Take 1 tablet (100 mg total) by mouth 2 (two) times daily. 08/28/18 08/28/19 Yes Reed, Tiffany L, DO  omeprazole (PRILOSEC) 20 MG capsule TAKE 1 CAPSULE TWICE DAILY BEFORE A MEAL 03/18/18  Yes Milus Banister, MD  ondansetron (ZOFRAN ODT) 4 MG disintegrating tablet Take 1 tablet (4 mg total) by mouth every 8 (eight) hours as needed for nausea or vomiting. 10/26/16  Yes Carmon Sails J, PA-C  pravastatin (PRAVACHOL) 40 MG tablet TAKE 1 TABLET EVERY EVENING 05/16/18  Yes Reed, Tiffany L, DO  PRESCRIPTION MEDICATION Inhale into the lungs at bedtime. CPAP   Yes [provider]  sucroferric oxyhydroxide (VELPHORO) 500 MG chewable tablet Chew 500-1,500  mg by mouth See admin instructions. Chew 3 tablets (1500 mg) by mouth up to three times daily with meals and 1 tablet (500 mg) with snacks   Yes [provider]  traMADol (ULTRAM) 50 MG tablet Take 1 tablet (50 mg total) by mouth daily as needed. 08/28/18  Yes Reed, Tiffany Carlean Jews, DO    Family History Family History  Adopted: Yes  Problem Relation Age of Onset  . Colon cancer Neg Hx   . Esophageal cancer Neg Hx   . Rectal cancer Neg Hx   . Stomach cancer Neg Hx     Social History Social History   Tobacco Use  . Smoking status: Current Every Day Smoker    Packs/day: 0.50    Years: 32.00    Pack years: 16.00    Types: Cigarettes  . Smokeless tobacco: Never Used  . Tobacco comment: 1 cigarette every couple days  Substance Use Topics  . Alcohol use: No    Alcohol/week: 0.0 standard drinks  . Drug use: No     Allergies   Codeine   Review of Systems Review of Systems  Constitutional: Negative for fever.  Respiratory: Positive for cough and shortness of breath.   Cardiovascular: Positive for chest pain. Negative for leg swelling.  Gastrointestinal: Negative for constipation, diarrhea, nausea and vomiting.  Genitourinary:       Does not make urine  Musculoskeletal: Positive for arthralgias and myalgias.  Skin: Negative for rash and wound.  Allergic/Immunologic: Positive for immunocompromised state.  Neurological: Positive for weakness.  Psychiatric/Behavioral: Positive for confusion.  All other systems reviewed and are negative.    Physical Exam Updated Vital Signs BP (!) 197/84   Pulse 76   Temp 98.5 F (36.9 C) (Oral)   Resp (!) 26   Ht 5\' 8"  (1.727 m)   Wt 97.1 kg   SpO2 98%   BMI 32.54 kg/m   Physical Exam Vitals signs and nursing note reviewed.  Constitutional:      Appearance: He is well-developed. He is obese. He is ill-appearing. He is not diaphoretic.  HENT:     Head: Normocephalic and atraumatic.  Cardiovascular:     Rate and Rhythm:  Normal rate and regular rhythm.  Pulmonary:     Effort: Tachypnea present.     Breath sounds: Rhonchi and rales present.  Abdominal:  Palpations: Abdomen is soft.     Tenderness: There is no abdominal tenderness.  Musculoskeletal:     Right lower leg: No edema.     Left lower leg: No edema.  Skin:    General: Skin is warm and dry.     Findings: No erythema.  Neurological:     Mental Status: He is alert and oriented to person, place, and time.  Psychiatric:        Behavior: Behavior normal.      ED Treatments / Results  Labs (all labs ordered are listed, but only abnormal results are displayed) Labs Reviewed  BASIC METABOLIC PANEL - Abnormal; Notable for the following components:      Result Value   Chloride 97 (*)    Glucose, Bld 126 (*)    BUN 24 (*)    Creatinine, Ser 7.60 (*)    Calcium 8.6 (*)    GFR calc non Af Amer 7 (*)    GFR calc Af Amer 8 (*)    All other components within normal limits  CBC - Abnormal; Notable for the following components:   WBC 3.2 (*)    RBC 2.47 (*)    Hemoglobin 7.9 (*)    HCT 24.9 (*)    MCV 100.8 (*)    All other components within normal limits  BRAIN NATRIURETIC PEPTIDE - Abnormal; Notable for the following components:   B Natriuretic Peptide 1,683.9 (*)    All other components within normal limits  SARS CORONAVIRUS 2 (HOSPITAL ORDER, [place] [facility])  TROPONIN I (HIGH SENSITIVITY)  TROPONIN I (HIGH SENSITIVITY)    EKG EKG Interpretation  Date/Time:  Wednesday September 17 2018 10:57:40 EDT Ventricular Rate:  82 PR Interval:    QRS Duration: 87 QT Interval:  420 QTC Calculation: 491 R Axis:   68 Text Interpretation:  Sinus rhythm Probable LVH with secondary repol abnrm Borderline prolonged QT interval Confirmed by Virgel Manifold [phone]) on 09/17/2018 3:01:34 PM   Radiology Dg Chest Portable 1 View  Result Date: 09/17/2018 CLINICAL DATA:  Shortness of breath EXAM: PORTABLE CHEST 1 VIEW COMPARISON:   08/12/2018 FINDINGS: Cardiac silhouette is mildly enlarged, unchanged. Pulmonary vascular congestion. Streaky bibasilar opacities. No large pleural fluid collection. No pneumothorax. IMPRESSION: Findings suggestive of fluid overload/CHF with probable early interstitial edema. Electronically Signed   By: Davina Poke M.D.   On: 09/17/2018 11:20    Procedures Procedures (including critical care time)  Medications Ordered in ED Medications  nitroGLYCERIN (NITROGLYN) 2 % ointment 1 inch (has no administration in time range)     Initial Impression / Assessment and Plan / ED Course  I have reviewed the triage vital signs and the nursing notes.  Pertinent labs & imaging results that were available during my care of the patient were reviewed by me and considered in my medical decision making (see chart for details).  Clinical Course as of Sep 17 1506  Wed Sep 16, 1368  3926 58 year old male brought in by EMS from dialysis for shortness of breath and generalized weakness.  Patient is tachypneic, only able to speak in few short phrases at a time, history obtained primarily from patient's wife over the phone.  Wife reports shortness of breath and weakness progressively worsening, states patient was able to go on walks previously in the neighborhood now limited ability to even leave the house.  Reports cough productive of frothy sputum for the past week since quitting smoking.  On exam patient  is tachypneic, oxygen in the upper 90s to 100, blood pressure elevated.  No lower leg edema. Lung sounds with course lung sounds throughout. Review of labs, CBC with anemia, hgb 7.9, BMP with Cr. 7.6, K 3.7. BNP 1683.9. Troponin x 1 negative. CXR consistent with fluid overload.  Patient was only able to complete 1/2 of his dialysis treatment today.  Given nitro paste for his BP and CHF. Discussed with Dr. Wilson Singer, Er attending, plan is to admit for CHF. Hospitalist paged for consult.   [LM]  6433 Case discussed  with Dr. Tamala Julian with [facility] to request nephrology consult for dialysis.   [LM]  1508 Case discussed with Dr. Joelyn Oms with nephrology who will consult.    [LM]    Clinical Course User Index [LM] Tacy Learn, PA-C     Final Clinical Impressions(s) / ED Diagnoses   Final diagnoses:  Acute congestive heart failure, unspecified heart failure type [facility])    ED Discharge Orders    None       Roque Lias 09/17/18 1509    Virgel Manifold, MD 09/18/18 1134

## 2018-09-17 NOTE — Consult Note (Signed)
[facility] Renal Consultation Note    Indication for Consultation:  Management of ESRD/hemodialysis, anemia, hypertension/volume, and secondary hyperparathyroidism. PCP:  HPI: Kirk [name] is a 58 y.o. male with ESRD (prior failed DDKT), HTN, HFrEF (EF 40-45%), OSA, and obesity was admitted with dyspnea.  Pt reports dyspnea with exertion and pre-syncopal sensation for past week. Has also had slightly productive cough and felt extremely fatigued. He called his HD unit early this morning that he was dyspneic - they brought him in early and started him on the machine with 4L UF goal. After ~ 1.5hr, he became acutely more dyspneic with c/o CP and reduced responsiveness. EMS called and he was brought to the ED. Vital signs showed ^ BP and tachypnea. Intake labs showed K 3.7, Ca 8.6, WBC 3.2, Hgb 7.9, and ^ BNP. Trop WNL. CXR showed pulm edmea. COVID-19 rapid test was negative.  Currently, resting fairly upright in bed. Not using oxygen mask at the moment. No CP currently. Denies nausea, vomiting, fever, chills, abdominal pain, diarrhea. Quit smoking few weeks ago.  Dialyzes at [place] on MWF schedule via AVF. He often has large gains - Pre-dialysis weight today was 98.4kg - which actually is not *that* high above his dry weight - has definitely come in with more than that. Possibly has lost body weight, he is not sure.  Past Medical History:  Diagnosis Date  . Acute edema of lung, unspecified   . Acute, but ill-defined, cerebrovascular disease   . Allergy   . Anemia   . 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)   . Anxiety   . Asthma   . Asthma    moderate persistent  . Carpal tunnel syndrome   . Cellulitis and abscess of trunk   . Cholelithiasis 07/13/2014  . Chronic headaches   . Debility, unspecified   . Dermatophytosis of the body   . Dysrhythmia    history of  . Edema   . End stage renal disease on dialysis [facility])    "MWF; [facility] in [facility]"  (10/21/2014)  . Essential hypertension, benign   . GERD (gastroesophageal reflux disease)   . Gout, unspecified   . HTN (hypertension)   . Hypertrophy of prostate without urinary obstruction and other lower urinary tract symptoms (LUTS)   . Hypotension, unspecified   . Impotence of organic origin   . Insomnia, unspecified   . Kidney replaced by transplant   . Localization-related (focal) (partial) epilepsy and epileptic syndromes with complex partial seizures, without mention of intractable epilepsy   . Lumbago   . Memory loss   . OSA on CPAP   . Other and unspecified hyperlipidemia   . Other chronic nonalcoholic liver disease   . Other malaise and fatigue   . Other nonspecific abnormal serum enzyme levels   . Pain in joint, lower leg   . Pain in joint, upper arm   . Pneumonia "several times"  . Renal dialysis status(V45.11) 02/05/2010   restarted 01/02/13 ofter renal trransplant failure  . Secondary hyperparathyroidism (of renal origin)   . Shortness of breath   . Sleep apnea   . Tension headache   . Unspecified constipation   . Unspecified essential hypertension   . Unspecified hereditary and idiopathic peripheral neuropathy   . Unspecified vitamin D deficiency    Past Surgical History:  Procedure Laterality Date  . AV FISTULA PLACEMENT Left ?2010   "forearm; at [facility]"  . BACK SURGERY    .  CARDIAC CATHETERIZATION  03/21/2011  . CHOLECYSTECTOMY N/A 10/21/2014   Procedure: LAPAROSCOPIC CHOLECYSTECTOMY WITH INTRAOPERATIVE CHOLANGIOGRAM;  Surgeon: Autumn Messing III, MD;  Location: [place];  Service: General;  Laterality: N/A;  . COLONOSCOPY    . 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 KIDNEY TRANSPLANT  08/17/2011   [place]  10/21/2014   w/IOC  . LEFT HEART CATHETERIZATION WITH CORONARY ANGIOGRAM N/A 03/21/2011   Procedure: LEFT HEART CATHETERIZATION WITH CORONARY ANGIOGRAM;  Surgeon: [name], MD;   Location: [facility] CATH LAB;  Service: Cardiovascular;  Laterality: N/A;  . NEPHRECTOMY  08/2013   removed transplaned kidney  . POSTERIOR FUSION CERVICAL SPINE  06/25/2012   for spinal stenosis  . VASECTOMY  2010   Family History  Adopted: Yes  Problem Relation Age of Onset  . Colon cancer Neg Hx   . Esophageal cancer Neg Hx   . Rectal cancer Neg Hx   . Stomach cancer Neg Hx    Social History:  reports that he has been smoking cigarettes. He has a 16.00 pack-year smoking history. He has never used smokeless tobacco. He reports that he does not drink alcohol or use drugs.  ROS: As per HPI otherwise negative.  Physical Exam: Vitals:   09/17/18 1130 09/17/18 1200 09/17/18 1230 09/17/18 1300  BP: (!) 188/81 (!) 174/79 (!) 170/64 (!) 197/84  Pulse: 76 71 76 76  Resp: (!) 27 (!) 21 (!) 28 (!) 26  Temp:      TempSrc:      SpO2: 98% 95% 95% 98%  Weight:      Height:         General: Overweight man, NAD. Oxygen mask in lap - not currently using. Head: Normocephalic, atraumatic, sclera non-icteric, mucus membranes are moist. Neck: Supple without lymphadenopathy/masses. JVD not elevated. Lungs: Clear bilaterally to auscultation without wheezes, rales, or rhonchi. Slightly tachypneic. Heart: RRR with normal S1, S2. No murmurs, rubs, or gallops appreciated. Abdomen: Soft, non-tender, non-distended with normoactive bowel sounds. No rebound/guarding. No obvious abdominal masses. Musculoskeletal:  Strength and tone appear normal for age. Lower extremities: Trace B ankle edema; no wounds Neuro: Alert and oriented X 3. Moves all extremities spontaneously. Psych:  Responds to questions appropriately with a normal affect. Dialysis Access: AVF + thrill  Allergies  Allergen Reactions  . Codeine Nausea And Vomiting   Prior to Admission medications   Medication Sig Start Date End Date Taking? Authorizing Provider  acetaminophen (TYLENOL) 500 MG tablet Take 1 tablet (500 mg total) by mouth every 6  (six) hours as needed for moderate pain. 09/12/14  Yes Samuella Cota, MD  albuterol (PROVENTIL HFA;VENTOLIN HFA) 108 (90 Base) MCG/ACT inhaler Inhale 2 puffs into the lungs every 6 (six) hours as needed for wheezing or shortness of breath. 06/05/17  Yes Patrecia Pour, MD  aspirin EC 81 MG tablet Take 81 mg by mouth daily.   Yes [provider]  b complex-vitamin c-folic acid (NEPHRO-VITE) 0.8 MG TABS tablet TAKE 1 TABLET BY MOUTH EVERY DAY 05/29/18  Yes Reed, Tiffany L, DO  budesonide-formoterol (SYMBICORT) 160-4.5 MCG/ACT inhaler Inhale 2 puffs into the lungs every 12 (twelve) hours. 07/08/18  Yes Tanda Rockers, MD  calcitRIOL (ROCALTROL) 0.5 MCG capsule Take 2 capsules (1 mcg total) by mouth every Monday, Wednesday, and Friday with hemodialysis. 06/04/18  Yes Black, Lezlie Octave, NP  Cholecalciferol (VITAMIN D3) 5000 UNITS TABS Take 5,000 Units by mouth daily.  Yes [provider]  ciclopirox (PENLAC) 8 % solution Apply topically at bedtime. Apply over nail and surrounding skin. Apply daily over previous coat. Remove weekly with file or polish remover. 09/11/18  Yes Evelina Bucy, DPM  cinacalcet (SENSIPAR) 30 MG tablet Take 90 mg by mouth daily with supper.  08/12/18  Yes [provider]  clonazePAM (KLONOPIN) 0.5 MG tablet Take 1 tablet (0.5 mg total) by mouth 2 (two) times daily as needed for anxiety. 09/02/18  Yes Reed, Tiffany L, DO  diphenhydrAMINE (BENADRYL) 25 MG tablet Take 50 mg by mouth at bedtime.    Yes [provider]  guaiFENesin (MUCINEX) 600 MG 12 hr tablet Take 1,200 mg by mouth 2 (two) times daily.   Yes [provider]  guaiFENesin-dextromethorphan (ROBITUSSIN DM) 100-10 MG/5ML syrup Take 10 mLs by mouth every 4 (four) hours as needed for cough.   Yes [provider]  hydrALAZINE (APRESOLINE) 100 MG tablet Take 1 tablet (100 mg total) by mouth 3 (three) times daily. 08/28/18  Yes Reed, Tiffany L, DO  metoprolol tartrate (LOPRESSOR)  100 MG tablet Take 1 tablet (100 mg total) by mouth 2 (two) times daily. 08/28/18 08/28/19 Yes Reed, Tiffany L, DO  omeprazole (PRILOSEC) 20 MG capsule TAKE 1 CAPSULE TWICE DAILY BEFORE A MEAL 03/18/18  Yes Milus Banister, MD  ondansetron (ZOFRAN ODT) 4 MG disintegrating tablet Take 1 tablet (4 mg total) by mouth every 8 (eight) hours as needed for nausea or vomiting. 10/26/16  Yes Carmon Sails J, PA-C  pravastatin (PRAVACHOL) 40 MG tablet TAKE 1 TABLET EVERY EVENING 05/16/18  Yes Reed, Tiffany L, DO  PRESCRIPTION MEDICATION Inhale into the lungs at bedtime. CPAP   Yes [provider]  sucroferric oxyhydroxide (VELPHORO) 500 MG chewable tablet Chew 500-1,500 mg by mouth See admin instructions. Chew 3 tablets (1500 mg) by mouth up to three times daily with meals and 1 tablet (500 mg) with snacks   Yes [provider]  traMADol (ULTRAM) 50 MG tablet Take 1 tablet (50 mg total) by mouth daily as needed. 08/28/18  Yes Reed, Tiffany L, DO   Current Facility-Administered Medications  Medication Dose Route Frequency Provider Last Rate Last Dose  . acetaminophen (TYLENOL) tablet 650 mg  650 mg Oral Q6H PRN Norval Morton, MD       Or  . acetaminophen (TYLENOL) suppository 650 mg  650 mg Rectal Q6H PRN Fuller Plan A, MD      . arformoterol (BROVANA) nebulizer solution 15 mcg  15 mcg Nebulization BID Fuller Plan A, MD      . Derrill Memo ON 09/18/2018] aspirin EC tablet 81 mg  81 mg Oral Daily Smith, Rondell A, MD      . budesonide (PULMICORT) nebulizer solution 0.5 mg  0.5 mg Nebulization BID Fuller Plan A, MD      . Derrill Memo ON 09/19/2018] calcitRIOL (ROCALTROL) capsule 1 mcg  1 mcg Oral Q M,W,F-HD Fuller Plan A, MD      . Derrill Memo ON 09/18/2018] Chlorhexidine Gluconate Cloth 2 % PADS 6 each  6 each Topical Q0600 Loren Racer, PA-C      . ciclopirox (PENLAC) 8 % solution   Topical QHS Smith, Rondell A, MD      . cinacalcet (SENSIPAR) tablet 90 mg  90 mg Oral Q supper Tamala Julian, Rondell  A, MD      . clonazePAM (KLONOPIN) tablet 0.5 mg  0.5 mg Oral BID PRN Norval Morton, MD      .  guaiFENesin (MUCINEX) 12 hr tablet 1,200 mg  1,200 mg Oral BID Tamala Julian, Rondell A, MD      . heparin injection 5,000 Units  5,000 Units Subcutaneous Q8H Smith, Rondell A, MD      . hydrALAZINE (APRESOLINE) tablet 100 mg  100 mg Oral TID Fuller Plan A, MD      . metoprolol tartrate (LOPRESSOR) tablet 100 mg  100 mg Oral BID Smith, Rondell A, MD      . nitroGLYCERIN (NITROGLYN) 2 % ointment 1 inch  1 inch Topical Once Tacy Learn, PA-C      . ondansetron (ZOFRAN) tablet 4 mg  4 mg Oral Q6H PRN Norval Morton, MD       Or  . ondansetron (ZOFRAN) injection 4 mg  4 mg Intravenous Q6H PRN Smith, Rondell A, MD      . pantoprazole (PROTONIX) EC tablet 40 mg  40 mg Oral BID AC Smith, Rondell A, MD      . pravastatin (PRAVACHOL) tablet 40 mg  40 mg Oral QPM Smith, Rondell A, MD      . sodium chloride flush (NS) 0.9 % injection 3 mL  3 mL Intravenous Q12H Smith, Rondell A, MD      . sucroferric oxyhydroxide (VELPHORO) chewable tablet 1,500 mg  1,500 mg Oral TID WC Smith, Rondell A, MD      . sucroferric oxyhydroxide (VELPHORO) chewable tablet 500 mg  500 mg Oral PRN Fuller Plan A, MD      . traMADol (ULTRAM) tablet 50 mg  50 mg Oral Daily PRN Norval Morton, MD       Current Outpatient Medications  Medication Sig Dispense Refill  . acetaminophen (TYLENOL) 500 MG tablet Take 1 tablet (500 mg total) by mouth every 6 (six) hours as needed for moderate pain.    Marland Kitchen albuterol (PROVENTIL HFA;VENTOLIN HFA) 108 (90 Base) MCG/ACT inhaler Inhale 2 puffs into the lungs every 6 (six) hours as needed for wheezing or shortness of breath. 1 Inhaler 0  . aspirin EC 81 MG tablet Take 81 mg by mouth daily.    Marland Kitchen b complex-vitamin c-folic acid (NEPHRO-VITE) 0.8 MG TABS tablet TAKE 1 TABLET BY MOUTH EVERY DAY 90 tablet 1  . budesonide-formoterol (SYMBICORT) 160-4.5 MCG/ACT inhaler Inhale 2 puffs into the lungs every 12  (twelve) hours. 1 Inhaler 0  . calcitRIOL (ROCALTROL) 0.5 MCG capsule Take 2 capsules (1 mcg total) by mouth every Monday, Wednesday, and Friday with hemodialysis. 60 capsule 1  . Cholecalciferol (VITAMIN D3) 5000 UNITS TABS Take 5,000 Units by mouth daily.     . ciclopirox (PENLAC) 8 % solution Apply topically at bedtime. Apply over nail and surrounding skin. Apply daily over previous coat. Remove weekly with file or polish remover. 6.6 mL 0  . cinacalcet (SENSIPAR) 30 MG tablet Take 90 mg by mouth daily with supper.     . clonazePAM (KLONOPIN) 0.5 MG tablet Take 1 tablet (0.5 mg total) by mouth 2 (two) times daily as needed for anxiety. 60 tablet 0  . diphenhydrAMINE (BENADRYL) 25 MG tablet Take 50 mg by mouth at bedtime.     Marland Kitchen guaiFENesin (MUCINEX) 600 MG 12 hr tablet Take 1,200 mg by mouth 2 (two) times daily.    Marland Kitchen guaiFENesin-dextromethorphan (ROBITUSSIN DM) 100-10 MG/5ML syrup Take 10 mLs by mouth every 4 (four) hours as needed for cough.    . hydrALAZINE (APRESOLINE) 100 MG tablet Take 1 tablet (100 mg total) by mouth 3 (three) times  daily. 90 tablet 1  . metoprolol tartrate (LOPRESSOR) 100 MG tablet Take 1 tablet (100 mg total) by mouth 2 (two) times daily. 180 tablet 1  . omeprazole (PRILOSEC) 20 MG capsule TAKE 1 CAPSULE TWICE DAILY BEFORE A MEAL 180 capsule 3  . ondansetron (ZOFRAN ODT) 4 MG disintegrating tablet Take 1 tablet (4 mg total) by mouth every 8 (eight) hours as needed for nausea or vomiting. 20 tablet 0  . pravastatin (PRAVACHOL) 40 MG tablet TAKE 1 TABLET EVERY EVENING 90 tablet 0  . PRESCRIPTION MEDICATION Inhale into the lungs at bedtime. CPAP    . sucroferric oxyhydroxide (VELPHORO) 500 MG chewable tablet Chew 500-1,500 mg by mouth See admin instructions. Chew 3 tablets (1500 mg) by mouth up to three times daily with meals and 1 tablet (500 mg) with snacks    . traMADol (ULTRAM) 50 MG tablet Take 1 tablet (50 mg total) by mouth daily as needed. 30 tablet 0   Labs: Basic  Metabolic Panel: Recent Labs  Lab 09/17/18 1218  NA 141  K 3.7  CL 97*  CO2 32  GLUCOSE 126*  BUN 24*  CREATININE 7.60*  CALCIUM 8.6*   CBC: Recent Labs  Lab 09/17/18 1218  WBC 3.2*  HGB 7.9*  HCT 24.9*  MCV 100.8*  PLT PLATELET CLUMPS NOTED ON SMEAR, COUNT APPEARS ADEQUATE   Studies/Results: Dg Chest Portable 1 View  Result Date: 09/17/2018 CLINICAL DATA:  Shortness of breath EXAM: PORTABLE CHEST 1 VIEW COMPARISON:  08/12/2018 FINDINGS: Cardiac silhouette is mildly enlarged, unchanged. Pulmonary vascular congestion. Streaky bibasilar opacities. No large pleural fluid collection. No pneumothorax. IMPRESSION: Findings suggestive of fluid overload/CHF with probable early interstitial edema. Electronically Signed   By: Davina Poke M.D.   On: 09/17/2018 11:20   Dialysis Orders:  MWF at [facility] 4:15hr, 450/A2.0, EDW 95.5kg, 2K/2Ca, AVF, heparin 4000 bolus - Mircera 254mcg IV q 2 weeks (given 8/24) - Hectoral 31mcg IV q HD  Assessment/Plan: 1.  Dyspnea/pulm edema: Will need extra HD for fluid removal - given current HD schedule, will likely be tomorrow AM.  2.  ESRD: Usual MWF schedule - half treatment today prior to ED eval. HD tomorrow, as above. UF as tolerated - hopefully can get him below prior EDW. 3.  Hypertension/volume: BP very high - would go ahead and give his home meds (hydralazine, metoprolol) - will continue to lower with next dialysis. 4.  Anemia: Hgb 7.9 - low. Just given ESA dose. Could be contributing to him symptoms - will transfuse if Hgb < 8 tomorrow. Ordered FOBT. 5.  Metabolic bone disease: Ca ok, continue home meds - Renvela/Sensipar and VDRA.   Veneta Penton, PA-C 09/17/2018, 4:24 PM  [facility] Pager: [phone]

## 2018-09-17 NOTE — Telephone Encounter (Signed)
  Wife is calling to make office aware that patient was still having distress last night. He went to dialysis this morning and they have called EMS to transport him to the ER.

## 2018-09-17 NOTE — H&P (Signed)
History and Physical    Kirk [name] KTG:[id number] DOB: 05-15-1960 DOA: 09/17/2018  Referring MD/NP/PA: Starlyn Skeans, PA-C PCP: Gayland Curry, DO  Patient coming from: Transfer from [facility] hemodialysis via EMS  Chief Complaint:    I have personally briefly reviewed patient's old medical records in [facility]   HPI: Kirk [name] is a 58 y.o. male with medical history significant of ESRD on HD(M/W/F), hypertension, diastolic CHF, asthma, partial seizures, GERD, and OSA on CPAP; who presents with worsening shortness of breath over the last 2 weeks.  He denies missing any recent hemodialysis sessions.  However, today while at hemodialysis he reports having a productive cough with frothy sputum.  He reports having 2 more liters of fluid needed to be drawn off.  Whenever he gets up or moves around at home he reports feeling extremely short of breath and lightheaded as though he may pass out.  Denies having any focal weakness, chest pain, nausea, vomiting, loss of consciousness, or falls here recently.  Normally following hemodialysis sessions he sleeps for most of the day.  Other associated symptoms include chills.   Patient was admitted into the hospital last month with similar symptoms.  ED Course: Upon admission into the emergency department patient was noted to be afebrile, pulse 71-79, respirations 21-34, blood pressure elevated up to 197/84, and O2 saturation on 2 L of nasal cannula oxygen.  WBC 3.2, hemoglobin 7.9, platelet count clumping, potassium 3.7, BUN 24, creatinine 7.6, and BNP 1683.9.  Chest x-ray showing signs of fluid overload.  TRH was called to admit.  Dr. Joelyn Oms of nephrology was consulted.  Review of Systems  Constitutional: Positive for chills and malaise/fatigue.  Eyes: Negative for photophobia and pain.  Respiratory: Positive for shortness of breath.   Neurological: Positive for dizziness.    Past Medical History:  Diagnosis Date  . Acute edema of lung,  unspecified   . Acute, but ill-defined, cerebrovascular disease   . Allergy   . Anemia   . 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)   . Anxiety   . Asthma   . Asthma    moderate persistent  . Carpal tunnel syndrome   . Cellulitis and abscess of trunk   . Cholelithiasis 07/13/2014  . Chronic headaches   . Debility, unspecified   . Dermatophytosis of the body   . Dysrhythmia    history of  . Edema   . End stage renal disease on dialysis [facility])    "MWF; [facility] in [facility]" (10/21/2014)  . Essential hypertension, benign   . GERD (gastroesophageal reflux disease)   . Gout, unspecified   . HTN (hypertension)   . Hypertrophy of prostate without urinary obstruction and other lower urinary tract symptoms (LUTS)   . Hypotension, unspecified   . Impotence of organic origin   . Insomnia, unspecified   . Kidney replaced by transplant   . Localization-related (focal) (partial) epilepsy and epileptic syndromes with complex partial seizures, without mention of intractable epilepsy   . Lumbago   . Memory loss   . OSA on CPAP   . Other and unspecified hyperlipidemia   . Other chronic nonalcoholic liver disease   . Other malaise and fatigue   . Other nonspecific abnormal serum enzyme levels   . Pain in joint, lower leg   . Pain in joint, upper arm   . Pneumonia "several times"  . Renal dialysis status(V45.11) 02/05/2010   restarted 01/02/13 ofter renal trransplant failure  . Secondary hyperparathyroidism (  of renal origin)   . Shortness of breath   . Sleep apnea   . Tension headache   . Unspecified constipation   . Unspecified essential hypertension   . Unspecified hereditary and idiopathic peripheral neuropathy   . Unspecified vitamin D deficiency     Past Surgical History:  Procedure Laterality Date  . AV FISTULA PLACEMENT Left ?2010   "forearm; at [facility]"  . BACK SURGERY    . CARDIAC CATHETERIZATION  03/21/2011  . CHOLECYSTECTOMY N/A 10/21/2014    Procedure: LAPAROSCOPIC CHOLECYSTECTOMY WITH INTRAOPERATIVE CHOLANGIOGRAM;  Surgeon: Autumn Messing III, MD;  Location: [facility];  Service: General;  Laterality: N/A;  . COLONOSCOPY    . 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 KIDNEY TRANSPLANT  08/17/2011   [place]  10/21/2014   w/IOC  . LEFT HEART CATHETERIZATION WITH CORONARY ANGIOGRAM N/A 03/21/2011   Procedure: LEFT HEART CATHETERIZATION WITH CORONARY ANGIOGRAM;  Surgeon: [name], MD;  Location: [facility] CATH LAB;  Service: Cardiovascular;  Laterality: N/A;  . NEPHRECTOMY  08/2013   removed transplaned kidney  . POSTERIOR FUSION CERVICAL SPINE  06/25/2012   for spinal stenosis  . VASECTOMY  2010     reports that he has been smoking cigarettes. He has a 16.00 pack-year smoking history. He has never used smokeless tobacco. He reports that he does not drink alcohol or use drugs.  Allergies  Allergen Reactions  . Codeine Nausea And Vomiting    Family History  Adopted: Yes  Problem Relation Age of Onset  . Colon cancer Neg Hx   . Esophageal cancer Neg Hx   . Rectal cancer Neg Hx   . Stomach cancer Neg Hx     Prior to Admission medications   Medication Sig Start Date End Date Taking? Authorizing Provider  acetaminophen (TYLENOL) 500 MG tablet Take 1 tablet (500 mg total) by mouth every 6 (six) hours as needed for moderate pain. 09/12/14  Yes Samuella Cota, MD  albuterol (PROVENTIL HFA;VENTOLIN HFA) 108 (90 Base) MCG/ACT inhaler Inhale 2 puffs into the lungs every 6 (six) hours as needed for wheezing or shortness of breath. 06/05/17  Yes Patrecia Pour, MD  aspirin EC 81 MG tablet Take 81 mg by mouth daily.   Yes [provider]  b complex-vitamin c-folic acid (NEPHRO-VITE) 0.8 MG TABS tablet TAKE 1 TABLET BY MOUTH EVERY DAY 05/29/18  Yes Reed, Tiffany L, DO  budesonide-formoterol (SYMBICORT) 160-4.5 MCG/ACT inhaler Inhale 2 puffs into the lungs every 12 (twelve) hours. 07/08/18   Yes Tanda Rockers, MD  calcitRIOL (ROCALTROL) 0.5 MCG capsule Take 2 capsules (1 mcg total) by mouth every Monday, Wednesday, and Friday with hemodialysis. 06/04/18  Yes Black, Lezlie Octave, NP  Cholecalciferol (VITAMIN D3) 5000 UNITS TABS Take 5,000 Units by mouth daily.    Yes [provider]  ciclopirox (PENLAC) 8 % solution Apply topically at bedtime. Apply over nail and surrounding skin. Apply daily over previous coat. Remove weekly with file or polish remover. 09/11/18  Yes Evelina Bucy, DPM  cinacalcet (SENSIPAR) 30 MG tablet Take 90 mg by mouth daily with supper.  08/12/18  Yes [provider]  clonazePAM (KLONOPIN) 0.5 MG tablet Take 1 tablet (0.5 mg total) by mouth 2 (two) times daily as needed for anxiety. 09/02/18  Yes Reed, Tiffany L, DO  diphenhydrAMINE (BENADRYL) 25 MG tablet Take 50 mg by mouth at bedtime.    Yes  [provider]  guaiFENesin (MUCINEX) 600 MG 12 hr tablet Take 1,200 mg by mouth 2 (two) times daily.   Yes [provider]  guaiFENesin-dextromethorphan (ROBITUSSIN DM) 100-10 MG/5ML syrup Take 10 mLs by mouth every 4 (four) hours as needed for cough.   Yes [provider]  hydrALAZINE (APRESOLINE) 100 MG tablet Take 1 tablet (100 mg total) by mouth 3 (three) times daily. 08/28/18  Yes Reed, Tiffany L, DO  metoprolol tartrate (LOPRESSOR) 100 MG tablet Take 1 tablet (100 mg total) by mouth 2 (two) times daily. 08/28/18 08/28/19 Yes Reed, Tiffany L, DO  omeprazole (PRILOSEC) 20 MG capsule TAKE 1 CAPSULE TWICE DAILY BEFORE A MEAL 03/18/18  Yes Milus Banister, MD  ondansetron (ZOFRAN ODT) 4 MG disintegrating tablet Take 1 tablet (4 mg total) by mouth every 8 (eight) hours as needed for nausea or vomiting. 10/26/16  Yes Carmon Sails J, PA-C  pravastatin (PRAVACHOL) 40 MG tablet TAKE 1 TABLET EVERY EVENING 05/16/18  Yes Reed, Tiffany L, DO  PRESCRIPTION MEDICATION Inhale into the lungs at bedtime. CPAP   Yes [provider]   sucroferric oxyhydroxide (VELPHORO) 500 MG chewable tablet Chew 500-1,500 mg by mouth See admin instructions. Chew 3 tablets (1500 mg) by mouth up to three times daily with meals and 1 tablet (500 mg) with snacks   Yes [provider]  traMADol (ULTRAM) 50 MG tablet Take 1 tablet (50 mg total) by mouth daily as needed. 08/28/18  Yes Reed, Rexene Edison, DO    Physical Exam:  Constitutional: Obese male who appears to be in some mild respiratory distress, but able to follow commands. Vitals:   09/17/18 1130 09/17/18 1200 09/17/18 1230 09/17/18 1300  BP: (!) 188/81 (!) 174/79 (!) 170/64 (!) 197/84  Pulse: 76 71 76 76  Resp: (!) 27 (!) 21 (!) 28 (!) 26  Temp:      TempSrc:      SpO2: 98% 95% 95% 98%  Weight:      Height:       Eyes: PERRL, lids and conjunctivae normal ENMT: Mucous membranes are moist. Posterior pharynx clear of any exudate or lesions.   Neck: normal, supple, no masses, no thyromegaly Respiratory: Tachypneic with positive expiratory Cardiovascular: Regular rate and rhythm, no murmurs / rubs / gallops.  Trace extremity edema. 2+ pedal pulses. No carotid bruits.  Left upper extremity fistula in place Abdomen: no tenderness, no masses palpated. No hepatosplenomegaly. Bowel sounds positive.  Musculoskeletal: no clubbing / cyanosis. No joint deformity upper and lower extremities. Good ROM, no contractures. Normal muscle tone.  Skin: no rashes, lesions, ulcers. No induration Neurologic: CN 2-12 grossly intact. Sensation intact, DTR normal. Strength 5/5 in all 4.  Psychiatric: Normal judgment and insight. Alert and oriented x 3. Normal mood.     Labs on Admission: I have personally reviewed following labs and imaging studies  CBC: Recent Labs  Lab 09/17/18 1218  WBC 3.2*  HGB 7.9*  HCT 24.9*  MCV 100.8*  PLT PLATELET CLUMPS NOTED ON SMEAR, COUNT APPEARS ADEQUATE   Basic Metabolic Panel: Recent Labs  Lab 09/17/18 1218  NA 141  K 3.7  CL 97*  CO2 32  GLUCOSE  126*  BUN 24*  CREATININE 7.60*  CALCIUM 8.6*   GFR: Estimated Creatinine Clearance: 12.3 mL/min (A) (by C-G formula based on SCr of 7.6 mg/dL (H)). Liver Function Tests: No results for input(s): AST, ALT, ALKPHOS, BILITOT, PROT, ALBUMIN in the last 168 hours. No results for  input(s): LIPASE, AMYLASE in the last 168 hours. No results for input(s): AMMONIA in the last 168 hours. Coagulation Profile: No results for input(s): INR, PROTIME in the last 168 hours. Cardiac Enzymes: No results for input(s): CKTOTAL, CKMB, CKMBINDEX, TROPONINI in the last 168 hours. BNP (last 3 results) No results for input(s): PROBNP in the last 8760 hours. HbA1C: No results for input(s): HGBA1C in the last 72 hours. CBG: No results for input(s): GLUCAP in the last 168 hours. Lipid Profile: No results for input(s): CHOL, HDL, LDLCALC, TRIG, CHOLHDL, LDLDIRECT in the last 72 hours. Thyroid Function Tests: No results for input(s): TSH, T4TOTAL, FREET4, T3FREE, THYROIDAB in the last 72 hours. Anemia Panel: No results for input(s): VITAMINB12, FOLATE, FERRITIN, TIBC, IRON, RETICCTPCT in the last 72 hours. Urine analysis:    Component Value Date/Time   COLORURINE YELLOW 02/19/2017 1520   APPEARANCEUR HAZY (A) 02/19/2017 1520   LABSPEC 1.012 02/19/2017 1520   PHURINE 8.0 02/19/2017 1520   GLUCOSEU 50 (A) 02/19/2017 1520   HGBUR MODERATE (A) 02/19/2017 1520   BILIRUBINUR NEGATIVE 02/19/2017 1520   KETONESUR NEGATIVE 02/19/2017 1520   PROTEINUR >=300 (A) 02/19/2017 1520   UROBILINOGEN 0.2 04/06/2014 1030   NITRITE NEGATIVE 02/19/2017 1520   LEUKOCYTESUR MODERATE (A) 02/19/2017 1520   Sepsis Labs: No results found for this or any previous visit (from the past 240 hour(s)).   Radiological Exams on Admission: Dg Chest Portable 1 View  Result Date: 09/17/2018 CLINICAL DATA:  Shortness of breath EXAM: PORTABLE CHEST 1 VIEW COMPARISON:  08/12/2018 FINDINGS: Cardiac silhouette is mildly enlarged, unchanged.  Pulmonary vascular congestion. Streaky bibasilar opacities. No large pleural fluid collection. No pneumothorax. IMPRESSION: Findings suggestive of fluid overload/CHF with probable early interstitial edema. Electronically Signed   By: Davina Poke M.D.   On: 09/17/2018 11:20    EKG: Independently reviewed.  Sinus rhythm at 82 bpm QTc 491  Assessment/Plan Dyspnea secondary to fluid overload, ESRD on HD: Patient reports going to hemodialysis as scheduled, but complaining of shortness of breath with productive cough.  On a Monday, Wednesday, Friday schedule.  Chest x-ray showing fluid overload and pulmonary edema and BNP 1683.9.  -Admit to a medical telemetry bed -Continuous pulse oximetry with nasal cannula oxygen as needed -Renal diet with fluid restriction -Continue home medications for metabolic bone disease -Albuterol nebs as needed -Appreciate nephrology consultative services, we will follow-up for further recommendations  Hypertensive urgency: Systolic blood pressures elevated up into the 190s. -Continue home medications of metoprolol and hydralazine -Hydralazine IV as needed for elevated blood pressures   Pancytopenia: Acute on chronic.  On admission WBC 3.2, hemoglobin 7.9, and platelets noted to be clumped.  Patient on ESA.  Nephrology recommending transfusion if hemoglobin less than 8 in a.m. -Recheck CBC in a.m.  Anxiety  -Klonopin as needed  Hyperlipidemia -Continue pravastatin  GERD -Continue pharmacy substitution of Protonix  Obesity: BMI 32.54 kg/m  Tobacco abuse -Continue counseling on need of cessation of tobacco  DVT prophylaxis: heparin Code Status: Full Family Communication: No family present at bedside Disposition Plan: Possible discharge home in 2 to 3 days consults called: Nephrology Admission status: Inpatient  Norval Morton MD [facility] Pager [phone]   If 7PM-7AM, please contact night-coverage [email or web address] Password [email or web address]   09/17/2018, 2:42 PM

## 2018-09-17 NOTE — ED Notes (Signed)
ED TO INPATIENT HANDOFF REPORT  ED Nurse Name and Phone #: Joellen Jersey [phone]  S Name/Age/Gender Kirk [name] 58 y.o. male Room/Bed: 022C/022C  Code Status   Code Status: Full Code  Home/SNF/Other Home Patient oriented to: self, place, time and situation Is this baseline? Yes   Triage Complete: Triage complete  Chief Complaint sob/dialysis pt  Triage Note Per [facility] pt coming from dialysis center for shortness of breath and coughing. EMS reports just under half of treatment completed. EMS transported patient on 6L [place]. Oxygen 100% on RA, placed on 2L  for comfort.    Allergies Allergies  Allergen Reactions  . Codeine Nausea And Vomiting    Level of Care/Admitting Diagnosis ED Disposition    ED Disposition Condition Comment   Admit  Hospital Area: [facility] [[id number]]  Level of Care: Telemetry Medical [104]  Covid Evaluation: Asymptomatic Screening Protocol (No Symptoms)  Diagnosis: Fluid overload [856314]  Admitting Physician: Norval Morton [[id number]]  Attending Physician: Norval Morton [[id number]]  Estimated length of stay: past midnight tomorrow  Certification:: I certify this patient will need inpatient services for at least 2 midnights  PT Class (Do Not Modify): Inpatient [101]  PT Acc Code (Do Not Modify): Private [1]       B Medical/Surgery History Past Medical History:  Diagnosis Date  . Acute edema of lung, unspecified   . Acute, but ill-defined, cerebrovascular disease   . Allergy   . Anemia   . 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)   . Anxiety   . Asthma   . Asthma    moderate persistent  . Carpal tunnel syndrome   . Cellulitis and abscess of trunk   . Cholelithiasis 07/13/2014  . Chronic headaches   . Debility, unspecified   . Dermatophytosis of the body   . Dysrhythmia    history of  . Edema   . End stage renal disease on dialysis [facility])    "MWF; [facility] in [facility]" (10/21/2014)  . Essential  hypertension, benign   . GERD (gastroesophageal reflux disease)   . Gout, unspecified   . HTN (hypertension)   . Hypertrophy of prostate without urinary obstruction and other lower urinary tract symptoms (LUTS)   . Hypotension, unspecified   . Impotence of organic origin   . Insomnia, unspecified   . Kidney replaced by transplant   . Localization-related (focal) (partial) epilepsy and epileptic syndromes with complex partial seizures, without mention of intractable epilepsy   . Lumbago   . Memory loss   . OSA on CPAP   . Other and unspecified hyperlipidemia   . Other chronic nonalcoholic liver disease   . Other malaise and fatigue   . Other nonspecific abnormal serum enzyme levels   . Pain in joint, lower leg   . Pain in joint, upper arm   . Pneumonia "several times"  . Renal dialysis status(V45.11) 02/05/2010   restarted 01/02/13 ofter renal trransplant failure  . Secondary hyperparathyroidism (of renal origin)   . Shortness of breath   . Sleep apnea   . Tension headache   . Unspecified constipation   . Unspecified essential hypertension   . Unspecified hereditary and idiopathic peripheral neuropathy   . Unspecified vitamin D deficiency    Past Surgical History:  Procedure Laterality Date  . AV FISTULA PLACEMENT Left ?2010   "forearm; at [facility]"  . BACK SURGERY    . CARDIAC CATHETERIZATION  03/21/2011  . CHOLECYSTECTOMY N/A 10/21/2014  Procedure: LAPAROSCOPIC CHOLECYSTECTOMY WITH INTRAOPERATIVE CHOLANGIOGRAM;  Surgeon: Autumn Messing III, MD;  Location: [facility];  Service: General;  Laterality: N/A;  . COLONOSCOPY    . 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 KIDNEY TRANSPLANT  08/17/2011   [place]  10/21/2014   w/IOC  . LEFT HEART CATHETERIZATION WITH CORONARY ANGIOGRAM N/A 03/21/2011   Procedure: LEFT HEART CATHETERIZATION WITH CORONARY ANGIOGRAM;  Surgeon: [name], MD;  Location: [facility] CATH LAB;  Service:  Cardiovascular;  Laterality: N/A;  . NEPHRECTOMY  08/2013   removed transplaned kidney  . POSTERIOR FUSION CERVICAL SPINE  06/25/2012   for spinal stenosis  . VASECTOMY  2010     A IV Location/Drains/Wounds Patient Lines/Drains/Airways Status   Active Line/Drains/Airways    Name:   Placement date:   Placement time:   Site:   Days:   Fistula / Graft Left Forearm   -    -    Forearm             Intake/Output Last 24 hours No intake or output data in the 24 hours ending 09/17/18 1748  Labs/Imaging Results for orders placed or performed during the hospital encounter of 09/17/18 (from the past 48 hour(s))  Basic metabolic panel     Status: Abnormal   Collection Time: 09/17/18 12:18 PM  Result Value Ref Range   Sodium 141 135 - 145 mmol/L   Potassium 3.7 3.5 - 5.1 mmol/L   Chloride 97 (L) 98 - 111 mmol/L   CO2 32 22 - 32 mmol/L   Glucose, Bld 126 (H) 70 - 99 mg/dL   BUN 24 (H) 6 - 20 mg/dL   Creatinine, Ser 7.60 (H) 0.61 - 1.24 mg/dL   Calcium 8.6 (L) 8.9 - 10.3 mg/dL   GFR calc non Af Amer 7 (L) >60 mL/min   GFR calc Af Amer 8 (L) >60 mL/min   Anion gap 12 5 - 15    Comment: Performed at [facility], [address] [place], [place] [address]  CBC     Status: Abnormal   Collection Time: 09/17/18 12:18 PM  Result Value Ref Range   WBC 3.2 (L) 4.0 - 10.5 K/uL   RBC 2.47 (L) 4.22 - 5.81 MIL/uL   Hemoglobin 7.9 (L) 13.0 - 17.0 g/dL   HCT 24.9 (L) 39.0 - 52.0 %   MCV 100.8 (H) 80.0 - 100.0 fL   MCH 32.0 26.0 - 34.0 pg   MCHC 31.7 30.0 - 36.0 g/dL   RDW 14.5 11.5 - 15.5 %   Platelets  150 - 400 K/uL    PLATELET CLUMPS NOTED ON SMEAR, COUNT APPEARS ADEQUATE    Comment: PLATELET COUNT CONFIRMED BY SMEAR Immature Platelet Fraction may be clinically indicated, consider ordering this additional test [id number]    nRBC 0.0 0.0 - 0.2 %    Comment: Performed at [facility], [place] [address], [place], [place] [address]  Troponin I (High Sensitivity)     Status: None    Collection Time: 09/17/18 12:18 PM  Result Value Ref Range   Troponin I (High Sensitivity) 16 <18 ng/L    Comment: (NOTE) Elevated high sensitivity troponin I (hsTnI) values and significant  changes across serial measurements may suggest ACS but many other  chronic and acute conditions are known to elevate hsTnI results.  Refer to the "Links" section for chest pain algorithms and additional  guidance. Performed at  [facility], [place] [address], [place], [place] [address]   Brain natriuretic peptide     Status: Abnormal   Collection Time: 09/17/18 12:18 PM  Result Value Ref Range   B Natriuretic Peptide 1,683.9 (H) 0.0 - 100.0 pg/mL    Comment: Performed at [place] [address], [place], [place] [address]  SARS Coronavirus 2 [facility] order, Performed in [facility] hospital lab) Nasopharyngeal Nasopharyngeal Swab     Status: None   Collection Time: 09/17/18  1:03 PM   Specimen: Nasopharyngeal Swab  Result Value Ref Range   SARS Coronavirus 2 NEGATIVE NEGATIVE    Comment: (NOTE) If result is NEGATIVE SARS-CoV-2 target nucleic acids are NOT DETECTED. The SARS-CoV-2 RNA is generally detectable in upper and lower  respiratory specimens during the acute phase of infection. The lowest  concentration of SARS-CoV-2 viral copies this assay can detect is 250  copies / mL. A negative result does not preclude SARS-CoV-2 infection  and should not be used as the sole basis for treatment or other  patient management decisions.  A negative result may occur with  improper specimen collection / handling, submission of specimen other  than nasopharyngeal swab, presence of viral mutation(s) within the  areas targeted by this assay, and inadequate number of viral copies  (<250 copies / mL). A negative result must be combined with clinical  observations, patient history, and epidemiological information. If result is POSITIVE SARS-CoV-2 target nucleic acids are DETECTED. The  SARS-CoV-2 RNA is generally detectable in upper and lower  respiratory specimens dur ing the acute phase of infection.  Positive  results are indicative of active infection with SARS-CoV-2.  Clinical  correlation with patient history and other diagnostic information is  necessary to determine patient infection status.  Positive results do  not rule out bacterial infection or co-infection with other viruses. If result is PRESUMPTIVE POSTIVE SARS-CoV-2 nucleic acids MAY BE PRESENT.   A presumptive positive result was obtained on the submitted specimen  and confirmed on repeat testing.  While 2019 novel coronavirus  (SARS-CoV-2) nucleic acids may be present in the submitted sample  additional confirmatory testing may be necessary for epidemiological  and / or clinical management purposes  to differentiate between  SARS-CoV-2 and other Sarbecovirus currently known to infect humans.  If clinically indicated additional testing with an alternate test  methodology [phone]) is advised. The SARS-CoV-2 RNA is generally  detectable in upper and lower respiratory sp ecimens during the acute  phase of infection. The expected result is Negative. Fact Sheet for Patients:  StrictlyIdeas.no Fact Sheet for Healthcare Providers: BankingDealers.co.za This test is not yet approved or cleared by the [place] FDA and has been authorized for detection and/or diagnosis of SARS-CoV-2 by FDA under an Emergency Use Authorization (EUA).  This EUA will remain in effect (meaning this test can be used) for the duration of the COVID-19 declaration under Section 564(b)(1) of the Act, 21 U.S.C. section 360bbb-3(b)(1), unless the authorization is terminated or revoked sooner. Performed at [facility], [place] [address], [place], [place] [address]   Troponin I (High Sensitivity)     Status: None   Collection Time: 09/17/18  2:00 PM  Result Value Ref Range    Troponin I (High Sensitivity) 17 <18 ng/L    Comment: (NOTE) Elevated high sensitivity troponin I (hsTnI) values and significant  changes across serial measurements may suggest ACS but many other  chronic and acute conditions are known to  elevate hsTnI results.  Refer to the "Links" section for chest pain algorithms and additional  guidance. Performed at [facility], [place] [address], [place], [place] [address]    Dg Chest Portable 1 View  Result Date: 09/17/2018 CLINICAL DATA:  Shortness of breath EXAM: PORTABLE CHEST 1 VIEW COMPARISON:  08/12/2018 FINDINGS: Cardiac silhouette is mildly enlarged, unchanged. Pulmonary vascular congestion. Streaky bibasilar opacities. No large pleural fluid collection. No pneumothorax. IMPRESSION: Findings suggestive of fluid overload/CHF with probable early interstitial edema. Electronically Signed   By: Davina Poke M.D.   On: 09/17/2018 11:20    Pending Labs Unresulted Labs (From admission, onward)    Start     Ordered   09/18/18 0500  CBC  Tomorrow morning,   R     09/17/18 1558   09/18/18 0500  Renal function panel  Tomorrow morning,   R     09/17/18 1558   Unscheduled  Occult blood card to lab, stool  As needed,   R     09/17/18 1643   Signed and Held  Renal function panel  Once,   R     Signed and Held   Signed and Held  CBC  Once,   R     Signed and Held          Vitals/Pain Today's Vitals   09/17/18 1300 09/17/18 1606 09/17/18 1630 09/17/18 1700  BP: (!) 197/84 (!) 187/76 (!) 179/80 (!) 188/69  Pulse: 76 80 85 81  Resp: (!) 26 20 19  (!) 24  Temp:      TempSrc:      SpO2: 98% 98% 98% 98%  Weight:      Height:      PainSc:        Isolation Precautions Airborne and Contact precautions  Medications Medications  Chlorhexidine Gluconate Cloth 2 % PADS 6 each (has no administration in time range)  hydrALAZINE (APRESOLINE) tablet 100 mg (100 mg Oral Given 09/17/18 1655)  metoprolol tartrate (LOPRESSOR) tablet 100 mg (has  no administration in time range)  pravastatin (PRAVACHOL) tablet 40 mg (40 mg Oral Given 09/17/18 1651)  cinacalcet (SENSIPAR) tablet 90 mg (0 mg Oral Hold 09/17/18 1709)  traMADol (ULTRAM) tablet 50 mg (has no administration in time range)  aspirin EC tablet 81 mg (has no administration in time range)  ciclopirox (PENLAC) 8 % solution (has no administration in time range)  guaiFENesin (MUCINEX) 12 hr tablet 1,200 mg (has no administration in time range)  clonazePAM (KLONOPIN) tablet 0.5 mg (has no administration in time range)  pantoprazole (PROTONIX) EC tablet 40 mg (40 mg Oral Given 09/17/18 1651)  heparin injection 5,000 Units (5,000 Units Subcutaneous Refused 09/17/18 1709)  sodium chloride flush (NS) 0.9 % injection 3 mL (has no administration in time range)  acetaminophen (TYLENOL) tablet 650 mg (has no administration in time range)    Or  acetaminophen (TYLENOL) suppository 650 mg (has no administration in time range)  ondansetron (ZOFRAN) tablet 4 mg (has no administration in time range)    Or  ondansetron (ZOFRAN) injection 4 mg (has no administration in time range)  arformoterol (BROVANA) nebulizer solution 15 mcg (has no administration in time range)  budesonide (PULMICORT) nebulizer solution 0.5 mg (has no administration in time range)  sevelamer carbonate (RENVELA) tablet 2,400 mg (0 mg Oral Hold 09/17/18 1709)  doxercalciferol (HECTOROL) injection 4 mcg (has no administration in time range)  nitroGLYCERIN (NITROGLYN) 2 % ointment 1 inch (1 inch Topical Given  09/17/18 1657)    Mobility walks High fall risk   Focused Assessments Renal Assessment Handoff:  Hemodialysis Schedule: Hemodialysis Schedule: Monday/Wednesday/Friday Last Hemodialysis date and time: Today   Restricted appendage: left arm     R Recommendations: See Admitting Provider Note  Report given to:   Additional Notes:

## 2018-09-17 NOTE — ED Triage Notes (Signed)
Per [facility] pt coming from dialysis center for shortness of breath and coughing. EMS reports just under half of treatment completed. EMS transported patient on 6L [place]. Oxygen 100% on RA, placed on 2L [place] for comfort.

## 2018-09-17 NOTE — Plan of Care (Signed)
Plan of care individualized and initiated.

## 2018-09-18 LAB — RENAL FUNCTION PANEL
Albumin: 3.1 g/dL — ABNORMAL LOW (ref 3.5–5.0)
Anion gap: 13 (ref 5–15)
BUN: 33 mg/dL — ABNORMAL HIGH (ref 6–20)
CO2: 28 mmol/L (ref 22–32)
Calcium: 8.6 mg/dL — ABNORMAL LOW (ref 8.9–10.3)
Chloride: 97 mmol/L — ABNORMAL LOW (ref 98–111)
Creatinine, Ser: 9.63 mg/dL — ABNORMAL HIGH (ref 0.61–1.24)
GFR calc Af Amer: 6 mL/min — ABNORMAL LOW (ref >60)
GFR calc non Af Amer: 5 mL/min — ABNORMAL LOW (ref >60)
Glucose, Bld: 115 mg/dL — ABNORMAL HIGH (ref 70–99)
Phosphorus: 6.3 mg/dL — ABNORMAL HIGH (ref 2.5–4.6)
Potassium: 4.3 mmol/L (ref 3.5–5.1)
Sodium: 138 mmol/L (ref 135–145)

## 2018-09-18 LAB — CBC
HCT: 24.7 % — ABNORMAL LOW (ref 39.0–52.0)
Hemoglobin: 7.8 g/dL — ABNORMAL LOW (ref 13.0–17.0)
MCH: 32.1 pg (ref 26.0–34.0)
MCHC: 31.6 g/dL (ref 30.0–36.0)
MCV: 101.6 fL — ABNORMAL HIGH (ref 80.0–100.0)
Platelets: 90 10*3/uL — ABNORMAL LOW (ref 150–400)
RBC: 2.43 MIL/uL — ABNORMAL LOW (ref 4.22–5.81)
RDW: 14.3 % (ref 11.5–15.5)
WBC: 3.4 10*3/uL — ABNORMAL LOW (ref 4.0–10.5)
nRBC: 0 % (ref 0.0–0.2)

## 2018-09-18 LAB — PREPARE RBC (CROSSMATCH)

## 2018-09-18 LAB — HEPATITIS B SURFACE ANTIGEN: Hepatitis B Surface Ag: NEGATIVE

## 2018-09-18 MED ORDER — SODIUM CHLORIDE 0.9% IV SOLUTION: Freq: Once | INTRAVENOUS | Status: DC

## 2018-09-18 MED ORDER — AMLODIPINE BESYLATE 10 MG PO TABS
10.0000 mg | ORAL_TABLET | Freq: Every day | ORAL | Status: DC
  Administered 2018-09-18: 10 mg via ORAL
  Filled 2018-09-18: qty 1

## 2018-09-18 NOTE — Progress Notes (Signed)
PROGRESS NOTE    Kirk [name]  JQZ:[id number] DOB: Aug 09, 1960 DOA: 09/17/2018 PCP: Gayland Curry, DO   Brief Narrative:  Patient is a 58 year old male with history of ESRD on dialysis, hypertension, diastolic CHF, asthma, partial seizures, GERD, question CVA who presented with shortness of breath over last 2 weeks.  His last dialysis was yesterday but could not complete the dialysis because of cough.  He was sent here for further evaluation.  Patient was found to be hypertensive on presentation.  Elevated BNP.  Chest x-ray showed signs of fluid overload.  Nephrology consulted and he underwent dialysis .   Assessment & Plan:   Principal Problem:   Fluid overload Active Problems:   Obesity   Pancytopenia ([place])   ESRD on dialysis ([place])   Cigarette smoker   Hypertensive urgency   Dyspnea secondary to fluid overload: Could not complete full hemodialysis on last wednesday.  Intermittent shortness of breath, cough.  Chest x-ray showed fluid overload, pulmonary edema.  Elevated BNP. Started dialysis today.  Nephrology following.  Hypertensive urgency: Presented with blood pressure in the range of 190s.  Continue metoprolol, hydralazine.  Added amlodipine today  Pancytopenia: Chronic.  Patient on ESA.  He is being transfused with a unit of PRBC today.  Check CBC tomorrow  Anxiety: Continue Klonopin as needed  Hyperlipidemia: Continue statin  GERD: Continue Protonix  Tobacco abuse: Counseled for smoking cessation.  Obesity: BMI of 32.5  OSA: Continue CPAP         DVT prophylaxis: Heparin [place] Code Status: Full Family Communication: None present at the bedside Disposition Plan: Likely home tomorrow   Consultants: Nephrology  Procedures: Hemodialysis  Antimicrobials:  Anti-infectives (From admission, onward)   None      Subjective:  Patient seen and examined the bedside this morning.  Currently feeling much better.  He does not complain of any shortness of  breath today.  Undergoing dialysis.  Objective: Vitals:   09/18/18 1300 09/18/18 1330 09/18/18 1350 09/18/18 1419  BP: (!) 173/77 (!) 160/74 (!) 177/70 (!) 176/74  Pulse: 76 76 80 79  Resp: (!) 21 20 18 18   Temp:   98.4 F (36.9 C) 98.3 F (36.8 C)  TempSrc:   Oral Oral  SpO2:   98% 95%  Weight:   95.3 kg   Height:        Intake/Output Summary (Last 24 hours) at 09/18/2018 1513 Last data filed at 09/18/2018 1350 Gross per 24 hour  Intake 1590 ml  Output 3700 ml  Net -2110 ml   Filed Weights   09/17/18 2104 09/18/18 0940 09/18/18 1350  Weight: 97.1 kg 98.3 kg 95.3 kg    Examination:  General exam: Appears calm and comfortable ,Not in distress,average built HEENT:PERRL,Oral mucosa moist, Ear/Nose normal on gross exam Respiratory system: Bilateral equal air entry, normal vesicular breath sounds, no wheezes or crackles  Cardiovascular system: S1 & S2 heard, RRR. No JVD, murmurs, rubs, gallops or clicks. Trace pedal edema. Gastrointestinal system: Abdomen is nondistended, soft and nontender. No organomegaly or masses felt. Normal bowel sounds heard. Central nervous system: Alert and oriented. No focal neurological deficits. Extremities: Trace bilateral lower extremity edema, no clubbing ,no cyanosis, distal peripheral pulses palpable. Skin: No rashes, lesions or ulcers,no icterus ,no pallor     Data Reviewed: I have personally reviewed following labs and imaging studies  CBC: Recent Labs  Lab 09/17/18 1218 09/18/18 0459  WBC 3.2* 3.4*  HGB 7.9* 7.8*  HCT 24.9* 24.7*  MCV  100.8* 101.6*  PLT PLATELET CLUMPS NOTED ON SMEAR, COUNT APPEARS ADEQUATE 90*   Basic Metabolic Panel: Recent Labs  Lab 09/17/18 1218 09/18/18 0459  NA 141 138  K 3.7 4.3  CL 97* 97*  CO2 32 28  GLUCOSE 126* 115*  BUN 24* 33*  CREATININE 7.60* 9.63*  CALCIUM 8.6* 8.6*  PHOS  --  6.3*   GFR: Estimated Creatinine Clearance: 9.4 mL/min (A) (by C-G formula based on SCr of 9.63 mg/dL (H)).  Liver Function Tests: Recent Labs  Lab 09/18/18 0459  ALBUMIN 3.1*   No results for input(s): LIPASE, AMYLASE in the last 168 hours. No results for input(s): AMMONIA in the last 168 hours. Coagulation Profile: No results for input(s): INR, PROTIME in the last 168 hours. Cardiac Enzymes: No results for input(s): CKTOTAL, CKMB, CKMBINDEX, TROPONINI in the last 168 hours. BNP (last 3 results) No results for input(s): PROBNP in the last 8760 hours. HbA1C: No results for input(s): HGBA1C in the last 72 hours. CBG: No results for input(s): GLUCAP in the last 168 hours. Lipid Profile: No results for input(s): CHOL, HDL, LDLCALC, TRIG, CHOLHDL, LDLDIRECT in the last 72 hours. Thyroid Function Tests: No results for input(s): TSH, T4TOTAL, FREET4, T3FREE, THYROIDAB in the last 72 hours. Anemia Panel: No results for input(s): VITAMINB12, FOLATE, FERRITIN, TIBC, IRON, RETICCTPCT in the last 72 hours. Sepsis Labs: No results for input(s): PROCALCITON, LATICACIDVEN in the last 168 hours.  Recent Results (from the past 240 hour(s))  SARS Coronavirus 2 [facility] order, Performed in [facility] hospital lab) Nasopharyngeal Nasopharyngeal Swab     Status: None   Collection Time: 09/17/18  1:03 PM   Specimen: Nasopharyngeal Swab  Result Value Ref Range Status   SARS Coronavirus 2 NEGATIVE NEGATIVE Final    Comment: (NOTE) If result is NEGATIVE SARS-CoV-2 target nucleic acids are NOT DETECTED. The SARS-CoV-2 RNA is generally detectable in upper and lower  respiratory specimens during the acute phase of infection. The lowest  concentration of SARS-CoV-2 viral copies this assay can detect is 250  copies / mL. A negative result does not preclude SARS-CoV-2 infection  and should not be used as the sole basis for treatment or other  patient management decisions.  A negative result may occur with  improper specimen collection / handling, submission of specimen other  than nasopharyngeal swab,  presence of viral mutation(s) within the  areas targeted by this assay, and inadequate number of viral copies  (<250 copies / mL). A negative result must be combined with clinical  observations, patient history, and epidemiological information. If result is POSITIVE SARS-CoV-2 target nucleic acids are DETECTED. The SARS-CoV-2 RNA is generally detectable in upper and lower  respiratory specimens dur ing the acute phase of infection.  Positive  results are indicative of active infection with SARS-CoV-2.  Clinical  correlation with patient history and other diagnostic information is  necessary to determine patient infection status.  Positive results do  not rule out bacterial infection or co-infection with other viruses. If result is PRESUMPTIVE POSTIVE SARS-CoV-2 nucleic acids MAY BE PRESENT.   A presumptive positive result was obtained on the submitted specimen  and confirmed on repeat testing.  While 2019 novel coronavirus  (SARS-CoV-2) nucleic acids may be present in the submitted sample  additional confirmatory testing may be necessary for epidemiological  and / or clinical management purposes  to differentiate between  SARS-CoV-2 and other Sarbecovirus currently known to infect humans.  If clinically indicated additional testing with an  alternate test  methodology [phone]) is advised. The SARS-CoV-2 RNA is generally  detectable in upper and lower respiratory sp ecimens during the acute  phase of infection. The expected result is Negative. Fact Sheet for Patients:  StrictlyIdeas.no Fact Sheet for Healthcare Providers: BankingDealers.co.za This test is not yet approved or cleared by the [place] FDA and has been authorized for detection and/or diagnosis of SARS-CoV-2 by FDA under an Emergency Use Authorization (EUA).  This EUA will remain in effect (meaning this test can be used) for the duration of the COVID-19 declaration under  Section 564(b)(1) of the Act, 21 U.S.C. section 360bbb-3(b)(1), unless the authorization is terminated or revoked sooner. Performed at [facility], [place] [address], [place], [place] [address]   MRSA PCR Screening     Status: None   Collection Time: 09/17/18  8:41 PM   Specimen: Nasal Mucosa; Nasopharyngeal  Result Value Ref Range Status   MRSA by PCR NEGATIVE NEGATIVE Final    Comment:        The GeneXpert MRSA Assay (FDA approved for NASAL specimens only), is one component of a comprehensive MRSA colonization surveillance program. It is not intended to diagnose MRSA infection nor to guide or monitor treatment for MRSA infections. Performed at [facility], [place] [address] [place], [place] [address]          Radiology Studies: Dg Chest Portable 1 View  Result Date: 09/17/2018 CLINICAL DATA:  Shortness of breath EXAM: PORTABLE CHEST 1 VIEW COMPARISON:  08/12/2018 FINDINGS: Cardiac silhouette is mildly enlarged, unchanged. Pulmonary vascular congestion. Streaky bibasilar opacities. No large pleural fluid collection. No pneumothorax. IMPRESSION: Findings suggestive of fluid overload/CHF with probable early interstitial edema. Electronically Signed   By: Davina Poke M.D.   On: 09/17/2018 11:20        Scheduled Meds: . sodium chloride   Intravenous Once  . arformoterol  15 mcg Nebulization BID  . aspirin EC  81 mg Oral Daily  . budesonide (PULMICORT) nebulizer solution  0.5 mg Nebulization BID  . Chlorhexidine Gluconate Cloth  6 each Topical Q0600  . cinacalcet  90 mg Oral Q supper  . [START ON 09/19/2018] doxercalciferol  4 mcg Intravenous Q M,W,F-HD  . guaiFENesin  1,200 mg Oral BID  . heparin  5,000 Units Subcutaneous Q8H  . hydrALAZINE  100 mg Oral TID  . metoprolol tartrate  100 mg Oral BID  . pantoprazole  40 mg Oral BID AC  . pravastatin  40 mg Oral QPM  . sevelamer carbonate  2,400 mg Oral TID WC  . sodium chloride flush  3 mL Intravenous Q12H    Continuous Infusions:   LOS: 1 day    Time spent: 35 mins.More than 50% of that time was spent in counseling and/or coordination of care.      Shelly Coss, MD [facility] Pager [phone]  If 7PM-7AM, please contact night-coverage [email or web address] Password [email or web address] 09/18/2018, 3:13 PM

## 2018-09-18 NOTE — Progress Notes (Signed)
[facility] Progress Note   Subjective:  Seen on HD - 3L UF goal and tolerating. Says that breathing wasn't too bad last night. Hgb down to 7.8 today - disc 1U PRBCs today and he is agreeable, will give while on HD.   Objective Vitals:   09/18/18 0458 09/18/18 0813 09/18/18 0819 09/18/18 0830  BP: (!) 175/73   (!) 181/80  Pulse: 72 77  73  Resp: 19 18  18   Temp: 98 F (36.7 C)   99.3 F (37.4 C)  TempSrc:    Oral  SpO2: 99% 93% 93% 95%  Weight:      Height:       Physical Exam General: Well appearing man, NAD. Room air Heart: RRR; no murmur Lungs: CTAB Abdomen: soft, non-tender Extremities: Trace LE edema Dialysis Access: L AVF + thrill  Additional Objective Labs: Basic Metabolic Panel: Recent Labs  Lab 09/17/18 1218 09/18/18 0459  NA 141 138  K 3.7 4.3  CL 97* 97*  CO2 32 28  GLUCOSE 126* 115*  BUN 24* 33*  CREATININE 7.60* 9.63*  CALCIUM 8.6* 8.6*  PHOS  --  6.3*   Liver Function Tests: Recent Labs  Lab 09/18/18 0459  ALBUMIN 3.1*   CBC: Recent Labs  Lab 09/17/18 1218 09/18/18 0459  WBC 3.2* 3.4*  HGB 7.9* 7.8*  HCT 24.9* 24.7*  MCV 100.8* 101.6*  PLT PLATELET CLUMPS NOTED ON SMEAR, COUNT APPEARS ADEQUATE 90*   Studies/Results: Dg Chest Portable 1 View  Result Date: 09/17/2018 CLINICAL DATA:  Shortness of breath EXAM: PORTABLE CHEST 1 VIEW COMPARISON:  08/12/2018 FINDINGS: Cardiac silhouette is mildly enlarged, unchanged. Pulmonary vascular congestion. Streaky bibasilar opacities. No large pleural fluid collection. No pneumothorax. IMPRESSION: Findings suggestive of fluid overload/CHF with probable early interstitial edema. Electronically Signed   By: Davina Poke M.D.   On: 09/17/2018 11:20   Medications:  . sodium chloride   Intravenous Once  . arformoterol  15 mcg Nebulization BID  . aspirin EC  81 mg Oral Daily  . budesonide (PULMICORT) nebulizer solution  0.5 mg Nebulization BID  . Chlorhexidine Gluconate Cloth  6  each Topical Q0600  . cinacalcet  90 mg Oral Q supper  . [START ON 09/19/2018] doxercalciferol  4 mcg Intravenous Q M,W,F-HD  . guaiFENesin  1,200 mg Oral BID  . heparin  5,000 Units Subcutaneous Q8H  . hydrALAZINE  100 mg Oral TID  . metoprolol tartrate  100 mg Oral BID  . pantoprazole  40 mg Oral BID AC  . pravastatin  40 mg Oral QPM  . sevelamer carbonate  2,400 mg Oral TID WC  . sodium chloride flush  3 mL Intravenous Q12H    Dialysis Orders: MWF at [facility] 4:15hr, 450/A2.0, EDW 95.5kg, 2K/2Ca, AVF, heparin 4000 bolus - Mircera 223mcg IV q 2 weeks (given 8/24) - Hectoral 22mcg IV q HD  Assessment/Plan: 1.  Dyspnea/pulm edema: Extra HD today for fluid removal. 2.  ESRD: Usual MWF schedule - extra HD today, then back to usual sched tomorrow. Trying to get him under prior EDW. 3.  Hypertension/volume: BP still high, but coming down with HD. Continue same home meds for now. 4.  Anemia: Hgb 7.8 - low. Just given ESA dose. Could be contributing to him symptoms - transfusing 1U PRBC today. FOBT ordered (pending). 5.  Metabolic bone disease: Ca ok, Phos up slightly. Continue home meds - Renvela/Sensipar and VDRA.  Veneta Penton, PA-C 09/18/2018, 10:20 AM  [facility] Pager: [phone]   

## 2018-09-19 ENCOUNTER — Telehealth: Payer: Self-pay | Admitting: *Deleted

## 2018-09-19 DIAGNOSIS — D631 Anemia in chronic kidney disease: Secondary | ICD-10-CM | POA: Diagnosis not present

## 2018-09-19 DIAGNOSIS — N2581 Secondary hyperparathyroidism of renal origin: Secondary | ICD-10-CM | POA: Diagnosis not present

## 2018-09-19 DIAGNOSIS — Z23 Encounter for immunization: Secondary | ICD-10-CM | POA: Diagnosis not present

## 2018-09-19 DIAGNOSIS — N186 End stage renal disease: Secondary | ICD-10-CM | POA: Diagnosis not present

## 2018-09-19 DIAGNOSIS — Z992 Dependence on renal dialysis: Secondary | ICD-10-CM | POA: Diagnosis not present

## 2018-09-19 LAB — BASIC METABOLIC PANEL
Anion gap: 12 (ref 5–15)
BUN: 22 mg/dL — ABNORMAL HIGH (ref 6–20)
CO2: 27 mmol/L (ref 22–32)
Calcium: 8.2 mg/dL — ABNORMAL LOW (ref 8.9–10.3)
Chloride: 100 mmol/L (ref 98–111)
Creatinine, Ser: 7.28 mg/dL — ABNORMAL HIGH (ref 0.61–1.24)
GFR calc Af Amer: 9 mL/min — ABNORMAL LOW (ref >60)
GFR calc non Af Amer: 8 mL/min — ABNORMAL LOW (ref >60)
Glucose, Bld: 110 mg/dL — ABNORMAL HIGH (ref 70–99)
Potassium: 3.8 mmol/L (ref 3.5–5.1)
Sodium: 139 mmol/L (ref 135–145)

## 2018-09-19 LAB — TYPE AND SCREEN
ABO/RH(D): A POS
Antibody Screen: NEGATIVE
Unit division: 0

## 2018-09-19 LAB — CBC WITH DIFFERENTIAL/PLATELET
Abs Immature Granulocytes: 0.01 10*3/uL (ref 0.00–0.07)
Basophils Absolute: 0 10*3/uL (ref 0.0–0.1)
Basophils Relative: 1 %
Eosinophils Absolute: 0.1 10*3/uL (ref 0.0–0.5)
Eosinophils Relative: 3 %
HCT: 28 % — ABNORMAL LOW (ref 39.0–52.0)
Hemoglobin: 8.9 g/dL — ABNORMAL LOW (ref 13.0–17.0)
Immature Granulocytes: 0 %
Lymphocytes Relative: 19 %
Lymphs Abs: 0.7 10*3/uL (ref 0.7–4.0)
MCH: 32.4 pg (ref 26.0–34.0)
MCHC: 31.8 g/dL (ref 30.0–36.0)
MCV: 101.8 fL — ABNORMAL HIGH (ref 80.0–100.0)
Monocytes Absolute: 0.4 10*3/uL (ref 0.1–1.0)
Monocytes Relative: 11 %
Neutro Abs: 2.4 10*3/uL (ref 1.7–7.7)
Neutrophils Relative %: 66 %
Platelets: 101 10*3/uL — ABNORMAL LOW (ref 150–400)
RBC: 2.75 MIL/uL — ABNORMAL LOW (ref 4.22–5.81)
RDW: 14.9 % (ref 11.5–15.5)
WBC: 3.7 10*3/uL — ABNORMAL LOW (ref 4.0–10.5)
nRBC: 0 % (ref 0.0–0.2)

## 2018-09-19 LAB — BPAM RBC
Blood Product Expiration Date: 202009182359
ISSUE DATE / TIME: 202008271123
Unit Type and Rh: 6200

## 2018-09-19 MED ORDER — AMLODIPINE BESYLATE 10 MG PO TABS: 10.0000 mg | ORAL_TABLET | Freq: Every day | ORAL | 0 refills | Status: DC

## 2018-09-19 NOTE — Telephone Encounter (Signed)
Transition Care Management Follow-up Telephone Call  Date of discharge and from where: 09/19/2018 [place]  How have you been since you were released from the hospital? Better  Any questions or concerns? No   Items Reviewed:  Did the pt receive and understand the discharge instructions provided? Yes   Medications obtained and verified? Yes   Any new allergies since your discharge? No   Dietary orders reviewed? Yes  Do you have support at home? Yes  Wife and Daughter  Other (ie: DME, [place], etc) None  Functional Questionnaire: (I = Independent and D = Dependent) ADL's: I  Bathing/Dressing- I   Meal Prep- D wife  Eating- I  Maintaining continence- I  Transferring/Ambulation- I  Managing Meds- I   Follow up appointments reviewed:    PCP Hospital f/u appt confirmed? Yes  Scheduled to see Dr. Mariea Clonts on 09/25/18 .  [facility] f/u appt confirmed? Yes  Scheduled to see Dr. Debara Pickett (Cardilolgist) on 9/29   Are transportation arrangements needed? No   If their condition worsens, is the pt aware to call  their PCP or go to the ED? Yes  Was the patient provided with contact information for the PCP's office or ED? Yes  Was the pt encouraged to call back with questions or concerns? Yes

## 2018-09-19 NOTE — Plan of Care (Signed)
  Problem: Activity: Goal: Risk for activity intolerance will decrease Outcome: Progressing   

## 2018-09-19 NOTE — Discharge Summary (Signed)
Physician Discharge Summary  Kirk [name] TGG:[id number] DOB: 01-18-1961 DOA: 09/17/2018  PCP: Gayland Curry, DO  Admit date: 09/17/2018 Discharge date: 09/19/2018  Admitted From: Home Disposition:  Home  Discharge Condition:Stable CODE STATUS:FULL Diet recommendation: Heart Healthy  Brief/Interim Summary:  Patient is a 58 year old male with history of ESRD on dialysis, hypertension, diastolic CHF, asthma, partial seizures, GERD, question CVA who presented with shortness of breath over last 2 weeks.  His last dialysis was yesterday but could not complete the dialysis because of cough.  He was sent here for further evaluation.  Patient was found to be hypertensive on presentation.  Elevated BNP.  Chest x-ray showed signs of fluid overload.  Nephrology consulted and he underwent dialysis .  This morning he remains hemodynamically stable, saturating fine on room air.  He has outpatient dialysis scheduled today.  After discussion with nephrology, he has been cleared for discharge today.  Following problems were addressed during his hospitalization:   Dyspnea secondary to fluid overload: Could not complete full hemodialysis on last wednesday.  Intermittent shortness of breath, cough.  Chest x-ray showed fluid overload, pulmonary edema.  Elevated BNP. Started dialysis here.  Nephrology was following.  Hypertensive urgency: Presented with blood pressure in the range of 190s.  Continue metoprolol, hydralazine.  Added amlodipine .  Pancytopenia: Chronic.  Patient on ESA.  He was transfused with a unit of PRBC today.   CBC this morning is stable.  Anxiety: Continue Klonopin as needed  Hyperlipidemia: Continue statin  GERD: Continue Protonix  Tobacco abuse: Counseled for smoking cessation.  Obesity: BMI of 32.5  OSA: Continue CPAP   Discharge Diagnoses:  Principal Problem:   Fluid overload Active Problems:   Obesity   Pancytopenia ([place])   ESRD on dialysis [facility])    Cigarette smoker   Hypertensive urgency    Discharge Instructions  Discharge Instructions    Diet - low sodium heart healthy   Complete by: As directed    Discharge instructions   Complete by: As directed    1) Follow up with outpatient dialysis center today. 2)Take prescribed medications as instructed. 3)Monitor your blood pressure at home.   Increase activity slowly   Complete by: As directed      Allergies as of 09/19/2018      Reactions   Codeine Nausea And Vomiting      Medication List    TAKE these medications   acetaminophen 500 MG tablet Commonly known as: TYLENOL Take 1 tablet (500 mg total) by mouth every 6 (six) hours as needed for moderate pain.   albuterol 108 (90 Base) MCG/ACT inhaler Commonly known as: VENTOLIN HFA Inhale 2 puffs into the lungs every 6 (six) hours as needed for wheezing or shortness of breath.   amLODipine 10 MG tablet Commonly known as: NORVASC Take 1 tablet (10 mg total) by mouth daily.   aspirin EC 81 MG tablet Take 81 mg by mouth daily.   b complex-vitamin c-folic acid 0.8 MG Tabs tablet TAKE 1 TABLET BY MOUTH EVERY DAY   Benadryl 25 MG tablet Generic drug: diphenhydrAMINE Take 50 mg by mouth at bedtime.   budesonide-formoterol 160-4.5 MCG/ACT inhaler Commonly known as: Symbicort Inhale 2 puffs into the lungs every 12 (twelve) hours.   calcitRIOL 0.5 MCG capsule Commonly known as: ROCALTROL Take 2 capsules (1 mcg total) by mouth every Monday, Wednesday, and Friday with hemodialysis.   ciclopirox 8 % solution Commonly known as: PENLAC Apply topically at bedtime. Apply over nail and  surrounding skin. Apply daily over previous coat. Remove weekly with file or polish remover.   cinacalcet 30 MG tablet Commonly known as: SENSIPAR Take 90 mg by mouth daily with supper.   clonazePAM 0.5 MG tablet Commonly known as: KLONOPIN Take 1 tablet (0.5 mg total) by mouth 2 (two) times daily as needed for anxiety.   guaiFENesin 600  MG 12 hr tablet Commonly known as: MUCINEX Take 1,200 mg by mouth 2 (two) times daily.   guaiFENesin-dextromethorphan 100-10 MG/5ML syrup Commonly known as: ROBITUSSIN DM Take 10 mLs by mouth every 4 (four) hours as needed for cough.   hydrALAZINE 100 MG tablet Commonly known as: APRESOLINE Take 1 tablet (100 mg total) by mouth 3 (three) times daily.   metoprolol tartrate 100 MG tablet Commonly known as: LOPRESSOR Take 1 tablet (100 mg total) by mouth 2 (two) times daily.   omeprazole 20 MG capsule Commonly known as: PRILOSEC TAKE 1 CAPSULE TWICE DAILY BEFORE A MEAL   ondansetron 4 MG disintegrating tablet Commonly known as: Zofran ODT Take 1 tablet (4 mg total) by mouth every 8 (eight) hours as needed for nausea or vomiting.   pravastatin 40 MG tablet Commonly known as: PRAVACHOL TAKE 1 TABLET EVERY EVENING   PRESCRIPTION MEDICATION Inhale into the lungs at bedtime. CPAP   traMADol 50 MG tablet Commonly known as: ULTRAM Take 1 tablet (50 mg total) by mouth daily as needed.   Velphoro 500 MG chewable tablet Generic drug: sucroferric oxyhydroxide Chew 500-1,500 mg by mouth See admin instructions. Chew 3 tablets (1500 mg) by mouth up to three times daily with meals and 1 tablet (500 mg) with snacks   Vitamin D3 125 MCG (5000 UT) Tabs Take 5,000 Units by mouth daily.      Follow-up Information    Reed, Tiffany L, DO. Schedule an appointment as soon as possible for a visit in 1 week(s).   Specialty: Geriatric Medicine Contact information: [address] [place] [address] [phone]          Allergies  Allergen Reactions  . Codeine Nausea And Vomiting    Consultations: Nephrology  Procedures/Studies: Dg Chest Portable 1 View  Result Date: 09/17/2018 CLINICAL DATA:  Shortness of breath EXAM: PORTABLE CHEST 1 VIEW COMPARISON:  08/12/2018 FINDINGS: Cardiac silhouette is mildly enlarged, unchanged. Pulmonary vascular congestion. Streaky bibasilar  opacities. No large pleural fluid collection. No pneumothorax. IMPRESSION: Findings suggestive of fluid overload/CHF with probable early interstitial edema. Electronically Signed   By: Davina Poke M.D.   On: 09/17/2018 11:20       Subjective:  Patient seen and examined the bedside this morning.  Comfortable.  Hemodynamically stable for discharge.  Discharge Exam: Vitals:   09/19/18 0819 09/19/18 0823  BP:    Pulse: 72   Resp: 18   Temp:    SpO2: 97% 97%   Vitals:   09/18/18 2104 09/19/18 0442 09/19/18 0819 09/19/18 0823  BP:  (!) 176/79    Pulse:  70 72   Resp:  18 18   Temp:  98.3 F (36.8 C)    TempSrc:  Oral    SpO2: 95% 98% 97% 97%  Weight:      Height:        General: Pt is alert, awake, not in acute distress Cardiovascular: RRR, S1/S2 +, no rubs, no gallops Respiratory: CTA bilaterally, no wheezing, no rhonchi Abdominal: Soft, NT, ND, bowel sounds + Extremities: no edema, no cyanosis    The results of significant  diagnostics from this hospitalization (including imaging, microbiology, ancillary and laboratory) are listed below for reference.     Microbiology: Recent Results (from the past 240 hour(s))  SARS Coronavirus 2 [facility] order, Performed in [facility] hospital lab) Nasopharyngeal Nasopharyngeal Swab     Status: None   Collection Time: 09/17/18  1:03 PM   Specimen: Nasopharyngeal Swab  Result Value Ref Range Status   SARS Coronavirus 2 NEGATIVE NEGATIVE Final    Comment: (NOTE) If result is NEGATIVE SARS-CoV-2 target nucleic acids are NOT DETECTED. The SARS-CoV-2 RNA is generally detectable in upper and lower  respiratory specimens during the acute phase of infection. The lowest  concentration of SARS-CoV-2 viral copies this assay can detect is 250  copies / mL. A negative result does not preclude SARS-CoV-2 infection  and should not be used as the sole basis for treatment or other  patient management decisions.  A negative result may  occur with  improper specimen collection / handling, submission of specimen other  than nasopharyngeal swab, presence of viral mutation(s) within the  areas targeted by this assay, and inadequate number of viral copies  (<250 copies / mL). A negative result must be combined with clinical  observations, patient history, and epidemiological information. If result is POSITIVE SARS-CoV-2 target nucleic acids are DETECTED. The SARS-CoV-2 RNA is generally detectable in upper and lower  respiratory specimens dur ing the acute phase of infection.  Positive  results are indicative of active infection with SARS-CoV-2.  Clinical  correlation with patient history and other diagnostic information is  necessary to determine patient infection status.  Positive results do  not rule out bacterial infection or co-infection with other viruses. If result is PRESUMPTIVE POSTIVE SARS-CoV-2 nucleic acids MAY BE PRESENT.   A presumptive positive result was obtained on the submitted specimen  and confirmed on repeat testing.  While 2019 novel coronavirus  (SARS-CoV-2) nucleic acids may be present in the submitted sample  additional confirmatory testing may be necessary for epidemiological  and / or clinical management purposes  to differentiate between  SARS-CoV-2 and other Sarbecovirus currently known to infect humans.  If clinically indicated additional testing with an alternate test  methodology [id number]) is advised. The SARS-CoV-2 RNA is generally  detectable in upper and lower respiratory sp ecimens during the acute  phase of infection. The expected result is Negative. Fact Sheet for Patients:  StrictlyIdeas.no Fact Sheet for Healthcare Providers: BankingDealers.co.za This test is not yet approved or cleared by the [place] FDA and has been authorized for detection and/or diagnosis of SARS-CoV-2 by FDA under an Emergency Use Authorization (EUA).  This  EUA will remain in effect (meaning this test can be used) for the duration of the COVID-19 declaration under Section 564(b)(1) of the Act, 21 U.S.C. section 360bbb-3(b)(1), unless the authorization is terminated or revoked sooner. Performed at [facility], [place] [address] [place], [place] [address]   MRSA PCR Screening     Status: None   Collection Time: 09/17/18  8:41 PM   Specimen: Nasal Mucosa; Nasopharyngeal  Result Value Ref Range Status   MRSA by PCR NEGATIVE NEGATIVE Final    Comment:        The GeneXpert MRSA Assay (FDA approved for NASAL specimens only), is one component of a comprehensive MRSA colonization surveillance program. It is not intended to diagnose MRSA infection nor to guide or monitor treatment for MRSA infections. Performed at [facility], [place] [address] [place], [place] [address]  Labs: BNP (last 3 results) Recent Labs    01/27/18 2100 05/14/18 1453 09/17/18 1218  BNP 951.2* 1,092.0* 0,109.3*   Basic Metabolic Panel: Recent Labs  Lab 09/17/18 1218 09/18/18 0459 09/19/18 0403  NA 141 138 139  K 3.7 4.3 3.8  CL 97* 97* 100  CO2 32 28 27  GLUCOSE 126* 115* 110*  BUN 24* 33* 22*  CREATININE 7.60* 9.63* 7.28*  CALCIUM 8.6* 8.6* 8.2*  PHOS  --  6.3*  --    Liver Function Tests: Recent Labs  Lab 09/18/18 0459  ALBUMIN 3.1*   No results for input(s): LIPASE, AMYLASE in the last 168 hours. No results for input(s): AMMONIA in the last 168 hours. CBC: Recent Labs  Lab 09/17/18 1218 09/18/18 0459 09/19/18 0403  WBC 3.2* 3.4* 3.7*  NEUTROABS  --   --  2.4  HGB 7.9* 7.8* 8.9*  HCT 24.9* 24.7* 28.0*  MCV 100.8* 101.6* 101.8*  PLT PLATELET CLUMPS NOTED ON SMEAR, COUNT APPEARS ADEQUATE 90* 101*   Cardiac Enzymes: No results for input(s): CKTOTAL, CKMB, CKMBINDEX, TROPONINI in the last 168 hours. BNP: Invalid input(s): POCBNP CBG: No results for input(s): GLUCAP in the last 168 hours. D-Dimer No results for  input(s): DDIMER in the last 72 hours. Hgb A1c No results for input(s): HGBA1C in the last 72 hours. Lipid Profile No results for input(s): CHOL, HDL, LDLCALC, TRIG, CHOLHDL, LDLDIRECT in the last 72 hours. Thyroid function studies No results for input(s): TSH, T4TOTAL, T3FREE, THYROIDAB in the last 72 hours.  Invalid input(s): FREET3 Anemia work up No results for input(s): VITAMINB12, FOLATE, FERRITIN, TIBC, IRON, RETICCTPCT in the last 72 hours. Urinalysis    Component Value Date/Time   COLORURINE YELLOW 02/19/2017 1520   APPEARANCEUR HAZY (A) 02/19/2017 1520   LABSPEC 1.012 02/19/2017 1520   PHURINE 8.0 02/19/2017 1520   GLUCOSEU 50 (A) 02/19/2017 1520   HGBUR MODERATE (A) 02/19/2017 1520   BILIRUBINUR NEGATIVE 02/19/2017 1520   KETONESUR NEGATIVE 02/19/2017 1520   PROTEINUR >=300 (A) 02/19/2017 1520   UROBILINOGEN 0.2 04/06/2014 1030   NITRITE NEGATIVE 02/19/2017 1520   LEUKOCYTESUR MODERATE (A) 02/19/2017 1520   Sepsis Labs Invalid input(s): PROCALCITONIN,  WBC,  LACTICIDVEN Microbiology Recent Results (from the past 240 hour(s))  SARS Coronavirus 2 [facility] order, Performed in [facility] hospital lab) Nasopharyngeal Nasopharyngeal Swab     Status: None   Collection Time: 09/17/18  1:03 PM   Specimen: Nasopharyngeal Swab  Result Value Ref Range Status   SARS Coronavirus 2 NEGATIVE NEGATIVE Final    Comment: (NOTE) If result is NEGATIVE SARS-CoV-2 target nucleic acids are NOT DETECTED. The SARS-CoV-2 RNA is generally detectable in upper and lower  respiratory specimens during the acute phase of infection. The lowest  concentration of SARS-CoV-2 viral copies this assay can detect is 250  copies / mL. A negative result does not preclude SARS-CoV-2 infection  and should not be used as the sole basis for treatment or other  patient management decisions.  A negative result may occur with  improper specimen collection / handling, submission of specimen other  than  nasopharyngeal swab, presence of viral mutation(s) within the  areas targeted by this assay, and inadequate number of viral copies  (<250 copies / mL). A negative result must be combined with clinical  observations, patient history, and epidemiological information. If result is POSITIVE SARS-CoV-2 target nucleic acids are DETECTED. The SARS-CoV-2 RNA is generally detectable in upper and lower  respiratory specimens dur ing  the acute phase of infection.  Positive  results are indicative of active infection with SARS-CoV-2.  Clinical  correlation with patient history and other diagnostic information is  necessary to determine patient infection status.  Positive results do  not rule out bacterial infection or co-infection with other viruses. If result is PRESUMPTIVE POSTIVE SARS-CoV-2 nucleic acids MAY BE PRESENT.   A presumptive positive result was obtained on the submitted specimen  and confirmed on repeat testing.  While 2019 novel coronavirus  (SARS-CoV-2) nucleic acids may be present in the submitted sample  additional confirmatory testing may be necessary for epidemiological  and / or clinical management purposes  to differentiate between  SARS-CoV-2 and other Sarbecovirus currently known to infect humans.  If clinically indicated additional testing with an alternate test  methodology [phone]) is advised. The SARS-CoV-2 RNA is generally  detectable in upper and lower respiratory sp ecimens during the acute  phase of infection. The expected result is Negative. Fact Sheet for Patients:  StrictlyIdeas.no Fact Sheet for Healthcare Providers: BankingDealers.co.za This test is not yet approved or cleared by the [place] FDA and has been authorized for detection and/or diagnosis of SARS-CoV-2 by FDA under an Emergency Use Authorization (EUA).  This EUA will remain in effect (meaning this test can be used) for the duration of  the COVID-19 declaration under Section 564(b)(1) of the Act, 21 U.S.C. section 360bbb-3(b)(1), unless the authorization is terminated or revoked sooner. Performed at [facility], [place] [address], [place], [place] [address]   MRSA PCR Screening     Status: None   Collection Time: 09/17/18  8:41 PM   Specimen: Nasal Mucosa; Nasopharyngeal  Result Value Ref Range Status   MRSA by PCR NEGATIVE NEGATIVE Final    Comment:        The GeneXpert MRSA Assay (FDA approved for NASAL specimens only), is one component of a comprehensive MRSA colonization surveillance program. It is not intended to diagnose MRSA infection nor to guide or monitor treatment for MRSA infections. Performed at [facility], [place] [address], [place], [place] [address]     Please note: You were cared for by a hospitalist during your hospital stay. Once you are discharged, your primary care physician will handle any further medical issues. Please note that NO REFILLS for any discharge medications will be authorized once you are discharged, as it is imperative that you return to your primary care physician (or establish a relationship with a primary care physician if you do not have one) for your post hospital discharge needs so that they can reassess your need for medications and monitor your lab values.    Time coordinating discharge: 40 minutes  SIGNED:   Shelly Coss, MD  [facility] 09/19/2018, 8:27 AM Pager [phone]  If 7PM-7AM, please contact night-coverage [email or web address] Password [email or web address]

## 2018-09-19 NOTE — Plan of Care (Signed)
  Problem: Education: Goal: Knowledge of General Education information will improve Description: Including pain rating scale, medication(s)/side effects and non-pharmacologic comfort measures Outcome: Adequate for Discharge   Problem: Health Behavior/Discharge Planning: Goal: Ability to manage health-related needs will improve Outcome: Adequate for Discharge   Problem: Clinical Measurements: Goal: Ability to maintain clinical measurements within normal limits will improve Outcome: Adequate for Discharge Goal: Will remain free from infection Outcome: Adequate for Discharge Goal: Diagnostic test results will improve Outcome: Adequate for Discharge Goal: Respiratory complications will improve Outcome: Adequate for Discharge   Problem: Activity: Goal: Risk for activity intolerance will decrease 09/19/2018 0938 by [name], RN Outcome: Adequate for Discharge 09/19/2018 [phone] by [name], RN Outcome: Progressing   Problem: Nutrition: Goal: Adequate nutrition will be maintained Outcome: Adequate for Discharge   Problem: Coping: Goal: Level of anxiety will decrease Outcome: Adequate for Discharge   Problem: Elimination: Goal: Will not experience complications related to bowel motility Outcome: Adequate for Discharge Goal: Will not experience complications related to urinary retention Outcome: Adequate for Discharge   Problem: Safety: Goal: Ability to remain free from injury will improve Outcome: Adequate for Discharge   Problem: Education: Goal: Knowledge of disease and its progression will improve Outcome: Adequate for Discharge Goal: Individualized Educational Video(s) Outcome: Adequate for Discharge   Problem: Fluid Volume: Goal: Compliance with measures to maintain balanced fluid volume will improve Outcome: Adequate for Discharge   Problem: Health Behavior/Discharge Planning: Goal: Ability to manage health-related needs will improve Outcome: Adequate  for Discharge   Problem: Nutritional: Goal: Ability to make healthy dietary choices will improve Outcome: Adequate for Discharge   Problem: Clinical Measurements: Goal: Complications related to the disease process, condition or treatment will be avoided or minimized Outcome: Adequate for Discharge

## 2018-09-19 NOTE — Progress Notes (Signed)
Patient has a second shift (12:30pm with 12:10pm arrival time) OP HD appointment at [facility] clinic and can be discharged prior to HD treatment in order to have OP HD today at his clinic. Patient aware and agreeable.   Alphonzo Cruise, [place] [facility]al Navigator [phone]

## 2018-09-19 NOTE — Progress Notes (Signed)
Kirk [name] to be discharged home per MD order. Discussed prescriptions and follow up appointments with the patient. Prescriptions given to patient; medication list explained in detail. Patient verbalized understanding.  Skin clean, dry and intact without evidence of skin break down, no evidence of skin tears noted. IV catheter discontinued intact. Site without signs and symptoms of complications. Dressing and pressure applied. Pt denies pain at the site currently. No complaints noted.  Patient free of lines, drains, and wounds.   An After Visit Summary (AVS) was printed and given to the patient. Patient escorted via wheelchair, and discharged home via private auto.  [name], RN

## 2018-09-22 DIAGNOSIS — Z992 Dependence on renal dialysis: Secondary | ICD-10-CM | POA: Diagnosis not present

## 2018-09-22 DIAGNOSIS — D631 Anemia in chronic kidney disease: Secondary | ICD-10-CM | POA: Diagnosis not present

## 2018-09-22 DIAGNOSIS — Z23 Encounter for immunization: Secondary | ICD-10-CM | POA: Diagnosis not present

## 2018-09-22 DIAGNOSIS — N186 End stage renal disease: Secondary | ICD-10-CM | POA: Diagnosis not present

## 2018-09-22 DIAGNOSIS — N2581 Secondary hyperparathyroidism of renal origin: Secondary | ICD-10-CM | POA: Diagnosis not present

## 2018-09-23 DIAGNOSIS — T8612 Kidney transplant failure: Secondary | ICD-10-CM | POA: Diagnosis not present

## 2018-09-23 DIAGNOSIS — Z992 Dependence on renal dialysis: Secondary | ICD-10-CM | POA: Diagnosis not present

## 2018-09-23 DIAGNOSIS — N186 End stage renal disease: Secondary | ICD-10-CM | POA: Diagnosis not present

## 2018-09-24 DIAGNOSIS — Z23 Encounter for immunization: Secondary | ICD-10-CM | POA: Diagnosis not present

## 2018-09-24 DIAGNOSIS — N186 End stage renal disease: Secondary | ICD-10-CM | POA: Diagnosis not present

## 2018-09-24 DIAGNOSIS — D631 Anemia in chronic kidney disease: Secondary | ICD-10-CM | POA: Diagnosis not present

## 2018-09-24 DIAGNOSIS — Z992 Dependence on renal dialysis: Secondary | ICD-10-CM | POA: Diagnosis not present

## 2018-09-24 DIAGNOSIS — N2581 Secondary hyperparathyroidism of renal origin: Secondary | ICD-10-CM | POA: Diagnosis not present

## 2018-09-25 ENCOUNTER — Other Ambulatory Visit: Payer: Self-pay

## 2018-09-25 ENCOUNTER — Encounter: Payer: Self-pay | Admitting: Internal Medicine

## 2018-09-25 ENCOUNTER — Ambulatory Visit (INDEPENDENT_AMBULATORY_CARE_PROVIDER_SITE_OTHER): Payer: Medicare Other | Admitting: Internal Medicine

## 2018-09-25 VITALS — BP 180/80 | HR 68 | Temp 97.7°F | Ht 67.0 in | Wt 210.0 lb

## 2018-09-25 DIAGNOSIS — N186 End stage renal disease: Secondary | ICD-10-CM

## 2018-09-25 DIAGNOSIS — Z992 Dependence on renal dialysis: Secondary | ICD-10-CM

## 2018-09-25 DIAGNOSIS — E1121 Type 2 diabetes mellitus with diabetic nephropathy: Secondary | ICD-10-CM

## 2018-09-25 DIAGNOSIS — J4540 Moderate persistent asthma, uncomplicated: Secondary | ICD-10-CM | POA: Insufficient documentation

## 2018-09-25 DIAGNOSIS — F419 Anxiety disorder, unspecified: Secondary | ICD-10-CM | POA: Diagnosis not present

## 2018-09-25 DIAGNOSIS — I10 Essential (primary) hypertension: Secondary | ICD-10-CM

## 2018-09-25 DIAGNOSIS — I5022 Chronic systolic (congestive) heart failure: Secondary | ICD-10-CM | POA: Insufficient documentation

## 2018-09-25 NOTE — Progress Notes (Signed)
Location:  [facility] clinic Provider: Brannan Cassedy L. Mariea Clonts, D.O., C.M.D.  Code Status: full code Goals of Care:  Advanced Directives 09/17/2018  Does Patient Have a Medical Advance Directive? No  Does patient want to make changes to medical advance directive? -  Would patient like information on creating a medical advance directive? No - Patient declined  Pre-existing out of facility DNR order (yellow form or pink MOST form) -     Chief Complaint  Patient presents with  . Transitions Of Care    discharged 09/19/2018    HPI: Patient is a 58 y.o. male with h/o ESRD on HD m/w/f, htn, chronic diastolic chf, asthma, partial seizures, gerd seen today for hospital follow-up s/p admission from 8/26-8/28/20 with volume overload.  He'd been sent to the ED from HD on 8/26 when he was coughing and his session could not be completed.  He was feeling very sob and lightheaded when he moved and he had chills.  He was tachypneic.  He was quite hypertensive at admission with elevated BNP, CXR c/w volume overload, he was dialyzed and discharged two days later.  Amlodipine was added to his bp medications.  He was transfused a unit of PRBCs due to his anemia.  He does receive epo thru nephrology.  covid test 8/26 was negative.    Had echo the Tuesday before this episode (8/25).  His wife talks about how sob he was when he went to get the test. His EF had declined to 40-45% from what it was before (55% in January).  Has appt with Dr. Debara Pickett 9/29.    Nephrology had taken him off amlodipine, but he was put back on at hospital d/c.  He has not taken it yet since going home.  BP is high today w/o it and advised to take it as directed.  His wife does not recall being told about the amlodipine being restarted.      Past Medical History:  Diagnosis Date  . Acute edema of lung, unspecified   . Acute, but ill-defined, cerebrovascular disease   . Allergy   . Anemia   . 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)   . Anxiety   .  Asthma   . Asthma    moderate persistent  . Carpal tunnel syndrome   . Cellulitis and abscess of trunk   . Cholelithiasis 07/13/2014  . Chronic headaches   . Debility, unspecified   . Dermatophytosis of the body   . Dysrhythmia    history of  . Edema   . End stage renal disease on dialysis [facility])    "MWF; [facility]" (10/21/2014)  . Essential hypertension, benign   . GERD (gastroesophageal reflux disease)   . Gout, unspecified   . HTN (hypertension)   . Hypertrophy of prostate without urinary obstruction and other lower urinary tract symptoms (LUTS)   . Hypotension, unspecified   . Impotence of organic origin   . Insomnia, unspecified   . Kidney replaced by transplant   . Localization-related (focal) (partial) epilepsy and epileptic syndromes with complex partial seizures, without mention of intractable epilepsy   . Lumbago   . Memory loss   . OSA on CPAP   . Other and unspecified hyperlipidemia   . Other chronic nonalcoholic liver disease   . Other malaise and fatigue   . Other nonspecific abnormal serum enzyme levels   . Pain in joint, lower leg   . Pain in joint, upper arm   . Pneumonia "  several times"  . Renal dialysis status(V45.11) 02/05/2010   restarted 01/02/13 ofter renal trransplant failure  . Secondary hyperparathyroidism (of renal origin)   . Shortness of breath   . Sleep apnea   . Tension headache   . Unspecified constipation   . Unspecified essential hypertension   . Unspecified hereditary and idiopathic peripheral neuropathy   . Unspecified vitamin D deficiency     Past Surgical History:  Procedure Laterality Date  . AV FISTULA PLACEMENT Left ?2010   "forearm; at [facility]"  . BACK SURGERY    . CARDIAC CATHETERIZATION  03/21/2011  . CHOLECYSTECTOMY N/A 10/21/2014   Procedure: LAPAROSCOPIC CHOLECYSTECTOMY WITH INTRAOPERATIVE CHOLANGIOGRAM;  Surgeon: Autumn Messing III, MD;  Location: [place];  Service: General;  Laterality: N/A;   . COLONOSCOPY    . 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 KIDNEY TRANSPLANT  08/17/2011   [place]  10/21/2014   w/IOC  . LEFT HEART CATHETERIZATION WITH CORONARY ANGIOGRAM N/A 03/21/2011   Procedure: LEFT HEART CATHETERIZATION WITH CORONARY ANGIOGRAM;  Surgeon: [name], MD;  Location: [facility] CATH LAB;  Service: Cardiovascular;  Laterality: N/A;  . NEPHRECTOMY  08/2013   removed transplaned kidney  . POSTERIOR FUSION CERVICAL SPINE  06/25/2012   for spinal stenosis  . VASECTOMY  2010    Allergies  Allergen Reactions  . Codeine Nausea And Vomiting    Outpatient Encounter Medications as of 09/25/2018  Medication Sig  . acetaminophen (TYLENOL) 500 MG tablet Take 1 tablet (500 mg total) by mouth every 6 (six) hours as needed for moderate pain.  Marland Kitchen albuterol (PROVENTIL HFA;VENTOLIN HFA) 108 (90 Base) MCG/ACT inhaler Inhale 2 puffs into the lungs every 6 (six) hours as needed for wheezing or shortness of breath.  Marland Kitchen amLODipine (NORVASC) 10 MG tablet Take 1 tablet (10 mg total) by mouth daily.  Marland Kitchen aspirin EC 81 MG tablet Take 81 mg by mouth daily.  Marland Kitchen b complex-vitamin c-folic acid (NEPHRO-VITE) 0.8 MG TABS tablet TAKE 1 TABLET BY MOUTH EVERY DAY  . budesonide-formoterol (SYMBICORT) 160-4.5 MCG/ACT inhaler Inhale 2 puffs into the lungs every 12 (twelve) hours.  . calcitRIOL (ROCALTROL) 0.5 MCG capsule Take 2 capsules (1 mcg total) by mouth every Monday, Wednesday, and Friday with hemodialysis.  . Cholecalciferol (VITAMIN D3) 5000 UNITS TABS Take 5,000 Units by mouth daily.   . ciclopirox (PENLAC) 8 % solution Apply topically at bedtime. Apply over nail and surrounding skin. Apply daily over previous coat. Remove weekly with file or polish remover.  . cinacalcet (SENSIPAR) 30 MG tablet Take 90 mg by mouth daily with supper.   . clonazePAM (KLONOPIN) 0.5 MG tablet Take 1 tablet (0.5 mg total) by mouth 2 (two) times daily as needed for anxiety.  .  diphenhydrAMINE (BENADRYL) 25 MG tablet Take 50 mg by mouth at bedtime.   Marland Kitchen guaiFENesin (MUCINEX) 600 MG 12 hr tablet Take 1,200 mg by mouth 2 (two) times daily.  Marland Kitchen guaiFENesin-dextromethorphan (ROBITUSSIN DM) 100-10 MG/5ML syrup Take 10 mLs by mouth every 4 (four) hours as needed for cough.  . hydrALAZINE (APRESOLINE) 100 MG tablet Take 1 tablet (100 mg total) by mouth 3 (three) times daily.  . metoprolol tartrate (LOPRESSOR) 100 MG tablet Take 1 tablet (100 mg total) by mouth 2 (two) times daily.  Marland Kitchen omeprazole (PRILOSEC) 20 MG capsule TAKE 1 CAPSULE TWICE DAILY BEFORE A MEAL  . ondansetron (ZOFRAN ODT) 4 MG disintegrating tablet Take 1  tablet (4 mg total) by mouth every 8 (eight) hours as needed for nausea or vomiting.  . pravastatin (PRAVACHOL) 40 MG tablet TAKE 1 TABLET EVERY EVENING  . PRESCRIPTION MEDICATION Inhale into the lungs at bedtime. CPAP  . sucroferric oxyhydroxide (VELPHORO) 500 MG chewable tablet Chew 500-1,500 mg by mouth See admin instructions. Chew 3 tablets (1500 mg) by mouth up to three times daily with meals and 1 tablet (500 mg) with snacks  . traMADol (ULTRAM) 50 MG tablet Take 1 tablet (50 mg total) by mouth daily as needed.   No facility-administered encounter medications on file as of 09/25/2018.     Review of Systems:  Review of Systems  Constitutional: Positive for malaise/fatigue. Negative for chills and fever.  HENT: Positive for hearing loss.   Eyes:       Glasses  Respiratory: Positive for shortness of breath. Negative for cough and sputum production.   Cardiovascular: Negative for chest pain, palpitations and leg swelling.       Some hand and forearm swelling   Gastrointestinal: Negative for abdominal pain, blood in stool, constipation, diarrhea and melena.  Genitourinary: Negative for dysuria.  Musculoskeletal: Negative for falls and joint pain.  Skin: Negative for itching and rash.  Neurological: Negative for dizziness and loss of consciousness.    Endo/Heme/Allergies: Does not bruise/bleed easily.  Psychiatric/Behavioral: Positive for depression. Negative for memory loss. The patient is nervous/anxious and has insomnia.     Health Maintenance  Topic Date Due  . OPHTHALMOLOGY EXAM  01[birth year]29/1972  . FOOT EXAM  04/29/2014  . TETANUS/TDAP  01/23/2016  . HEMOGLOBIN A1C  07/29/2018  . INFLUENZA VACCINE  08/23/2018  . COLONOSCOPY  08/30/2019  . PNEUMOCOCCAL POLYSACCHARIDE VACCINE AGE 51-64 HIGH RISK  Completed  . Hepatitis C Screening  Completed  . HIV Screening  Completed    Physical Exam: Vitals:   09/25/18 1436  BP: (!) 180/80  Pulse: 68  Temp: 97.7 F (36.5 C)  TempSrc: Oral  SpO2: 95%  Weight: 210 lb (95.3 kg)  Height: 5\' 7"  (1.702 m)   Body mass index is 32.89 kg/m. Physical Exam Vitals signs reviewed.  Constitutional:      Appearance: He is obese. He is not ill-appearing or toxic-appearing.     Comments: Will close eyes when not immediately involved in discussion  HENT:     Head: Normocephalic and atraumatic.     Ears:     Comments: HOH Eyes:     Comments: glasses  Cardiovascular:     Rate and Rhythm: Normal rate and regular rhythm.     Pulses: Normal pulses.     Heart sounds: Normal heart sounds.     Comments: Left av fistula with normal pulse and thrill Pulmonary:     Effort: Pulmonary effort is normal.     Breath sounds: Normal breath sounds.  Musculoskeletal: Normal range of motion.     Right lower leg: No edema.  Skin:    General: Skin is warm and dry.     Capillary Refill: Capillary refill takes less than 2 seconds.     Coloration: Skin is not jaundiced.  Neurological:     General: No focal deficit present.     Mental Status: He is alert. Mental status is at baseline.     Motor: No weakness.     Gait: Gait normal.  Psychiatric:        Mood and Affect: Mood normal.     Comments: "I'm tired of it all"  Labs reviewed: Basic Metabolic Panel: Recent Labs    01/28/18 0242  01/29/18 0655   06/02/18 0307  08/13/18 0613 09/17/18 1218 09/18/18 0459 09/19/18 0403  NA  --    < > 138   < > 143   < > 135 141 138 139  K  --    < > 4.6   < > 4.2   < > 3.6 3.7 4.3 3.8  CL  --    < > 99   < > 105   < > 96* 97* 97* 100  CO2  --    < > 30   < > 24   < > 23 32 28 27  GLUCOSE  --    < > 128*   < > 134*   < > 121* 126* 115* 110*  BUN  --    < > 20   < > 40*   < > 46* 24* 33* 22*  CREATININE  --    < > 7.98*   < > 14.16*   < > 13.97* 7.60* 9.63* 7.28*  CALCIUM  --    < > 9.8   < > 9.0   < > 8.7* 8.6* 8.6* 8.2*  MG  --   --  2.1  --   --   --   --   --   --   --   PHOS  --   --   --   --  8.4*  --  6.6*  --  6.3*  --   TSH 0.787  --   --   --   --   --   --   --   --   --    < > = values in this interval not displayed.   Liver Function Tests: Recent Labs    06/01/18 2030  06/02/18 1044 08/12/18 1644 08/13/18 0613 09/18/18 0459  AST 24  --   --  27  --   --   ALT 19  --   --  35  --   --   ALKPHOS 59  --   --  87  --   --   BILITOT 1.5*  --  1.1 0.8  --   --   PROT 6.8  --   --  6.8  --   --   ALBUMIN 3.6   < >  --  3.6 3.3* 3.1*   < > = values in this interval not displayed.   No results for input(s): LIPASE, AMYLASE in the last 8760 hours. Recent Labs    08/12/18 1644  AMMONIA 34   CBC: Recent Labs    05/14/18 1453 06/01/18 2030  09/17/18 1218 09/18/18 0459 09/19/18 0403  WBC 4.6 5.1   < > 3.2* 3.4* 3.7*  NEUTROABS 3.5 3.5  --   --   --  2.4  HGB 9.6* 10.0*   < > 7.9* 7.8* 8.9*  HCT 31.0* 31.9*   < > 24.9* 24.7* 28.0*  MCV 102.0* 100.3*   < > 100.8* 101.6* 101.8*  PLT 136* 101*   < > PLATELET CLUMPS NOTED ON SMEAR, COUNT APPEARS ADEQUATE 90* 101*   < > = values in this interval not displayed.   Lipid Panel: Recent Labs    01/28/18 0242  CHOL 106  HDL 23*  LDLCALC 51  TRIG 160*  CHOLHDL 4.6   Lab Results  Component Value Date   HGBA1C 5.1 01/28/2018  Procedures since last visit: Dg Chest Portable 1 View  Result Date: 09/17/2018 CLINICAL DATA:   Shortness of breath EXAM: PORTABLE CHEST 1 VIEW COMPARISON:  08/12/2018 FINDINGS: Cardiac silhouette is mildly enlarged, unchanged. Pulmonary vascular congestion. Streaky bibasilar opacities. No large pleural fluid collection. No pneumothorax. IMPRESSION: Findings suggestive of fluid overload/CHF with probable early interstitial edema. Electronically Signed   By: Davina Poke M.D.   On: 09/17/2018 11:20    Assessment/Plan 1. Chronic systolic heart failure (HCC) -new reduced EF noted on echo just before admission--not severe -he appears euvolemic now with just a touch of edema in hands, clear lungs  2. ESRD on dialysis [facility]) -m/w/f -reviewed records from HD center--is now getting hectoral for his anemia through this month -had 1 unit prbcs at hospital -these should help his fatigue/malaise  3. Type 2 diabetes mellitus with diabetic nephropathy, without long-term current use of insulin (HCC) - has been under great control as long as I've known him, but did have diabetic range hba1c in the past - Hemoglobin A1c - Lipid panel  4. Uncontrolled hypertension -BP still very high today, but his wife was afraid to start the amlodipine back that was started at the hospital b/c she was not told about it and it had been stopped previously at HD -I ok'd resuming it considering his bp readings  5. Moderate persistent asthma without complication -cont symbicort, albuterol -avoid smoking of any type including vapes  6. Anxiety -ongoing with panic attacks--continues on klonopin for this  Labs/tests ordered:   Lab Orders     Hemoglobin A1c     Lipid panel He will get his flu shot at HD and his wife will ask them to send [place] a report about it when given  meds were reconciled from hospitalization  Will copy cardiology and nephrology on note.  Next appt: 4 mos med mgt and prn  Kinlie Janice L. Zakary Kimura, D.O. [facility] [address], [place]  [address] Cell Phone (Mon-Fri 8am-5pm):  [phone] On Call:  [phone] & follow prompts after 5pm & weekends Office Phone:  [phone] Office Fax:  [phone]

## 2018-09-26 DIAGNOSIS — D631 Anemia in chronic kidney disease: Secondary | ICD-10-CM | POA: Diagnosis not present

## 2018-09-26 DIAGNOSIS — N186 End stage renal disease: Secondary | ICD-10-CM | POA: Diagnosis not present

## 2018-09-26 DIAGNOSIS — N2581 Secondary hyperparathyroidism of renal origin: Secondary | ICD-10-CM | POA: Diagnosis not present

## 2018-09-26 DIAGNOSIS — Z23 Encounter for immunization: Secondary | ICD-10-CM | POA: Diagnosis not present

## 2018-09-26 DIAGNOSIS — Z992 Dependence on renal dialysis: Secondary | ICD-10-CM | POA: Diagnosis not present

## 2018-09-26 LAB — HEMOGLOBIN A1C
Hgb A1c MFr Bld: 5.4 % of total Hgb (ref <5.7)
Mean Plasma Glucose: 108 (calc)
eAG (mmol/L): 6 (calc)

## 2018-09-26 LAB — LIPID PANEL
Cholesterol: 123 mg/dL (ref <200)
HDL: 31 mg/dL — ABNORMAL LOW (ref >=40)
LDL Cholesterol (Calc): 69 mg/dL (calc)
Non-HDL Cholesterol (Calc): 92 mg/dL (calc) (ref <130)
Total CHOL/HDL Ratio: 4 (calc) (ref <5.0)
Triglycerides: 151 mg/dL — ABNORMAL HIGH (ref <150)

## 2018-09-29 DIAGNOSIS — D631 Anemia in chronic kidney disease: Secondary | ICD-10-CM | POA: Diagnosis not present

## 2018-09-29 DIAGNOSIS — N186 End stage renal disease: Secondary | ICD-10-CM | POA: Diagnosis not present

## 2018-09-29 DIAGNOSIS — N2581 Secondary hyperparathyroidism of renal origin: Secondary | ICD-10-CM | POA: Diagnosis not present

## 2018-09-29 DIAGNOSIS — Z992 Dependence on renal dialysis: Secondary | ICD-10-CM | POA: Diagnosis not present

## 2018-09-29 DIAGNOSIS — Z23 Encounter for immunization: Secondary | ICD-10-CM | POA: Diagnosis not present

## 2018-10-01 DIAGNOSIS — D631 Anemia in chronic kidney disease: Secondary | ICD-10-CM | POA: Diagnosis not present

## 2018-10-01 DIAGNOSIS — Z23 Encounter for immunization: Secondary | ICD-10-CM | POA: Diagnosis not present

## 2018-10-01 DIAGNOSIS — Z992 Dependence on renal dialysis: Secondary | ICD-10-CM | POA: Diagnosis not present

## 2018-10-01 DIAGNOSIS — N186 End stage renal disease: Secondary | ICD-10-CM | POA: Diagnosis not present

## 2018-10-01 DIAGNOSIS — N2581 Secondary hyperparathyroidism of renal origin: Secondary | ICD-10-CM | POA: Diagnosis not present

## 2018-10-02 ENCOUNTER — Other Ambulatory Visit: Payer: Self-pay

## 2018-10-02 DIAGNOSIS — B351 Tinea unguium: Secondary | ICD-10-CM

## 2018-10-02 MED ORDER — KETOCONAZOLE 2 % EX CREA: TOPICAL_CREAM | Freq: Every day | CUTANEOUS | Status: DC

## 2018-10-03 DIAGNOSIS — Z992 Dependence on renal dialysis: Secondary | ICD-10-CM | POA: Diagnosis not present

## 2018-10-03 DIAGNOSIS — N186 End stage renal disease: Secondary | ICD-10-CM | POA: Diagnosis not present

## 2018-10-03 DIAGNOSIS — Z23 Encounter for immunization: Secondary | ICD-10-CM | POA: Diagnosis not present

## 2018-10-03 DIAGNOSIS — N2581 Secondary hyperparathyroidism of renal origin: Secondary | ICD-10-CM | POA: Diagnosis not present

## 2018-10-03 DIAGNOSIS — D631 Anemia in chronic kidney disease: Secondary | ICD-10-CM | POA: Diagnosis not present

## 2018-10-06 DIAGNOSIS — N2581 Secondary hyperparathyroidism of renal origin: Secondary | ICD-10-CM | POA: Diagnosis not present

## 2018-10-06 DIAGNOSIS — D631 Anemia in chronic kidney disease: Secondary | ICD-10-CM | POA: Diagnosis not present

## 2018-10-06 DIAGNOSIS — Z992 Dependence on renal dialysis: Secondary | ICD-10-CM | POA: Diagnosis not present

## 2018-10-06 DIAGNOSIS — Z23 Encounter for immunization: Secondary | ICD-10-CM | POA: Diagnosis not present

## 2018-10-06 DIAGNOSIS — N186 End stage renal disease: Secondary | ICD-10-CM | POA: Diagnosis not present

## 2018-10-08 DIAGNOSIS — Z992 Dependence on renal dialysis: Secondary | ICD-10-CM | POA: Diagnosis not present

## 2018-10-08 DIAGNOSIS — Z23 Encounter for immunization: Secondary | ICD-10-CM | POA: Diagnosis not present

## 2018-10-08 DIAGNOSIS — N186 End stage renal disease: Secondary | ICD-10-CM | POA: Diagnosis not present

## 2018-10-08 DIAGNOSIS — D631 Anemia in chronic kidney disease: Secondary | ICD-10-CM | POA: Diagnosis not present

## 2018-10-08 DIAGNOSIS — N2581 Secondary hyperparathyroidism of renal origin: Secondary | ICD-10-CM | POA: Diagnosis not present

## 2018-10-09 ENCOUNTER — Encounter: Payer: Self-pay | Admitting: Internal Medicine

## 2018-10-09 ENCOUNTER — Ambulatory Visit (INDEPENDENT_AMBULATORY_CARE_PROVIDER_SITE_OTHER): Payer: Medicare Other | Admitting: Internal Medicine

## 2018-10-09 ENCOUNTER — Other Ambulatory Visit: Payer: Self-pay

## 2018-10-09 VITALS — BP 150/74 | HR 64 | Temp 98.0°F | Ht 67.0 in | Wt 214.6 lb

## 2018-10-09 DIAGNOSIS — M1712 Unilateral primary osteoarthritis, left knee: Secondary | ICD-10-CM

## 2018-10-09 DIAGNOSIS — M25561 Pain in right knee: Secondary | ICD-10-CM | POA: Diagnosis not present

## 2018-10-09 DIAGNOSIS — J4530 Mild persistent asthma, uncomplicated: Secondary | ICD-10-CM | POA: Diagnosis not present

## 2018-10-09 DIAGNOSIS — F17210 Nicotine dependence, cigarettes, uncomplicated: Secondary | ICD-10-CM

## 2018-10-09 DIAGNOSIS — I10 Essential (primary) hypertension: Secondary | ICD-10-CM | POA: Diagnosis not present

## 2018-10-09 MED ORDER — BUDESONIDE-FORMOTEROL FUMARATE 160-4.5 MCG/ACT IN AERO: 2.0000 | INHALATION_SPRAY | Freq: Two times a day (BID) | RESPIRATORY_TRACT | 0 refills | Status: DC

## 2018-10-09 NOTE — Assessment & Plan Note (Signed)
Counseled re importance of smoking cessation but did not meet time criteria for separate billing     I had an extended discussion with the patient/wife reviewing all relevant studies completed to date and  lasting 15 to 20 minutes of a 25 minute visit    I performed detailed device teaching using a teach back method which extended face to face time for this visit (see above)  Each maintenance medication was reviewed in detail including emphasizing most importantly the difference between maintenance and prns and under what circumstances the prns are to be triggered using an action plan format that is not reflected in the computer generated alphabetically organized AVS which I have not found useful in most complex patients, especially with respiratory illnesses  Please see AVS for specific instructions unique to this visit that I personally wrote and verbalized to the the pt in detail and then reviewed with pt  by my nurse highlighting any  changes in therapy recommended at today's visit to their plan of care.

## 2018-10-09 NOTE — Assessment & Plan Note (Addendum)
In the setting of respiratory symptoms of unknown etiology,  It may  be preferable to use bystolic, the most beta -1  selective Beta blocker available in sample form, with bisoprolol the most selective generic choice  on the market, at least on a trial basis, if high doses of lopressor are needed or if more of a refractory asthma problem develops  to make sure the spillover Beta 2 effects of the less specific Beta blockers are not contributing to this patient's symptoms.

## 2018-10-09 NOTE — Patient Instructions (Addendum)
Plan A = Automatic = Always=    Symbicort 160 Take 2 puffs first thing in am and then another 2 puffs about 12 hours later.    Work on inhaler technique:  relax and gently blow all the way out then take a nice smooth deep breath back in, triggering the inhaler at same time you start breathing in.  Hold for up to 5 seconds if you can. Blow out thru nose. Rinse and gargle with water when done   think  of a golfer warming up before you use the inhaler   Plan B = Backup (to supplement plan A, not to replace it) Only use your albuterol (ventolin and proventil) inhaler as a rescue medication to be used if you can't catch your breath by resting or doing a relaxed purse lip breathing pattern.  - The less you use it, the better it will work when you need it. - Ok to use the inhaler up to 2 puffs  every 4 hours if you must but call for appointment if use goes up over your usual need - Don't leave home without it !!  (think of it like the spare tire for your car)   Plan C = Crisis (instead of Plan B but only if Plan B stops working) - only use your albuterol nebulizer if you first try Plan B and it fails to help > ok to use the nebulizer up to every 4 hours but if start needing it regularly call for immediate appointment   The high doses of lopressor may interfere with your breathing treatments but are probably ok for now    Please schedule a follow up visit in 3 months but call sooner if needed

## 2018-10-09 NOTE — Patient Instructions (Signed)
Rest Ice  Compress Elevate Use a cane in your left hand. You will hear from orthopedics about an appt.

## 2018-10-09 NOTE — Progress Notes (Signed)
Subjective:   Patient ID: Kirk [name], male    DOB: [birth year]     MRN: [id number]    Brief patient profile:  38 yobm active smoker  referred 10/18/2014 to [facility] for eval of ? Copd for clearance for lap chole  with  no airflow obst on spiromtery   10/18/2014 on HD since 2012    History of Present Illness  10/18/2014 1st [facility] office visit/ Kirk [name]   Chief Complaint  Patient presents with  . Advice Only    refer Dr. Nyoka Cowden. Denies any wheezing, no chest tx, no cough. Pt here to be evaluated for COPD.    variable sob with exertion - says can walk fine but no regular walking,  Some sob steps   rec Plan A = Backup=  symbiocrt 80 Take 2 puffs first thing in am and then another 2 puffs about 12 hours later.  Plan B = Back up Only use your combivent  as a rescue medication  Plan C = crisis Only use your nebulizer if you try B first and doesn't work You are cleared for surgery  > tol lap chole fine    Admit date: 04/29/2017 Discharge date: 05/02/2017  Admitted From: Home Disposition:  Home   Recommendations for Outpatient Follow-up:  1. Follow up with PCP in 1-2 weeks 2. Please obtain BMP/CBC in one week   Discharge Condition: Stable CODE STATUS:FULL Diet recommendation: Renal diet   Brief/Interim Summary: 58 year old male with end-stage renal disease on hemodialysis who presents with 48 hours of worsening malaise, associated with productive cough, headache, nausea and poor appetite. On his initial physical examination his temperature is 102.35F, heart rate 98, blood pressure 165/68, respiratory rate 26, oxygen saturation 91-92% on room air. Dry mucous membranes,mild pallor, lungs with bilateral rhonchi, heart S1-S2 present rhythmic, abdomen soft nontender, no lower extremity edema. Sodium 138, potassium 3.1, chloride 94, bicarb 29, glucose 136, BUN 22, creatinine 7.57, white count 5.5,hemoglobin 10.3, hematocrit 31.4, platelets 86.Chest x-ray with a small  patchy infiltrate at the right upper lobe.  Patient wasadmitted to the hospital with a working diagnosis of right upper lobe pneumonia,complicated by sepsis and hypokalemia.The patient was started on intravenous antibiotics. Nephrology was consulted to assist with dialysis.    Discharge Diagnoses:  Sepsis -due to pneumonia -continue ceftriaxone and azithromycin -pt has defervesced--no fever in the last 24 hours -Initially presented with fever of 102.5 F -Blood cultures remain negative -Personally reviewed chest x-ray--mild right upper lobe opacity -most recent temp 98.62F at 1240 on 05/01/17  Healthcare associated pneumonia -Defervesced on ceftriaxone and azithromycin -Check procalcitonin--1.99 -Influenza PCR--neg -Viral respiratory panel--positive coronavirus -home with cefdinir and azithromycin x 4 more days to finish 7 days of tx  Intractable headache -Likely toxic headache from the patient's sepsis -CT brain--neg for acute findings -Continue tramadol as needed severe headache -benadryl and compazine IV x 1 and IV solumedrol-->improved  ESRD -Last received dialysis for 05/01/2017 -Nephrology following -Metabolic bone disease per nephrology  Essential hypertension -Continue metoprolol tartrate and hydralazine -remains high  COPD Exacerbation -mild wheezing on exam-->improved with steroids -start IV solumedrol--home with prednisone x 3 more days -continue bronchodilators -started pulmicort while inpatient -stable on RA      07/11/2017 transition of care post hosp  f/u ov/Kirk [name] re:   Active smoker /AB with restrictive spirometry ? Obesity related  Chief Complaint  Patient presents with  . Hospitalization Follow-up    follow up for PNA in both lungs last month 05/30/17.  Per patient, still has a productive cough with clear phlegm. Breathing is getting better.   Dyspnea:  7/11 ok not HT  So not back to basline yet, also limited by fatigue, esp p HD MWF  Cough: since pna coughing noct and early am x up to a tbsp, white mucus assoc with nasal congestion Sleep: up 30 degrees  SABA use:  Both hfa and neb/ confused with when to use  rec Plan A = Automatic = symbicort 80 Take 2 puffs first thing in am and then another 2 puffs about 12 hours later.  Work on inhaler technique:  relax and gently blow all the way out then take a nice smooth deep breath back in, triggering the inhaler at same time you start breathing in.  Hold for up to 5 seconds if you can. Blow out thru nose. Rinse and gargle with water when done Plan B = Backup Only use your albuterol (ventolin)as a rescue medication Plan C = Crisis - only use your albuterol nebulizer if you first try Plan B and it fails to help > ok to use the nebulizer up to every 4 hours but if start needing it regularly call for immediate appointment The key is to stop smoking completely before smoking completely stops you!    09/30/2017  f/u ov/Kirk [name] re: asthma/ smoker/ f/u [facility] pna Chief Complaint  Patient presents with  . Follow-up    Pt c/o increased cough with white sputum, SOB, nasal congestion, runny nose x 2 wks.   Dyspnea:  Walked at [employer] using rollator 2 d prior to OV   Cough: white esp hs  Sleeping: not needing saba no elevate  SABA use: more need over the last 2 weeks 2-3 days/ neb x 2  02: just at hd   rec Plan A = Automatic = symbicort increase to 160 Take 2 puffs first thing in am and then another 2 puffs about 12 hours later.  Work on [employer] B = Backup Only use your albuterol (ventolin)  as a rescue medication Plan C = Crisis - only use your albuterol nebulizer if you first try Plan B and it fails to help > ok to use the nebulizer up to every 4 hours but if start needing it regularly call for immediate appointment The key is to stop smoking completely before smoking completely stops you!     12/30/2017  f/u ov/Kirk [name] re: smoker with AB/ still smoking/  maint on symb 160 2bid Chief Complaint  Patient presents with  . Follow-up    Increased cough and wheezing for the past 2-3 wks. He is coughing up some clear sputum. He is using his albuterol inhaler about 4 x per day and neb with albuterol about 2 x per wk.    Dyspnea:  MMRC3 = can't walk 100 yards even at a slow pace at a flat grade s stopping due to sob   Cough: worse than usual, feels like a chest cold/ mucoid production only  Sleeping: sitting upright like on a couch SABA use: baseline neb once a month, up to qid x sev weeks with onset of "chest  cold" , saba does help rec Stop robitussin and use maximum dose of mucinex dm up to 1200 mg every 12hours as needed and  Take tramadol 50 mg every 4 hours if needed  Prednisone 10 mg take  4 each am x 2 days,   2 each am x 2 days,  1 each  am x 2 days and stop  Prilosec should be Take 30- 60 min before your first and last meals of the day  if not improving need a cxr at the old building and office visit here asap     01/24/2018 NP Chest x ray showed fluid overload Patient is on dialysis - will need more fluid taken off Please follow up with dialysis and cardiology mucinex dm up to 1200 mg every 12hours as needed and  Take tramadol 50 mg every 4 hours if needed  Prilosec should be Take 30- 60 min before your first and last meals of the day   Admit date: 01/27/2018 Discharge date: 01/30/2018   Diet recommendation: Renal diet   Brief/Interim Summary:  Admission HPI written by Ivor Costa, MD   Chief Complaint:SOB, chest pain,rectal bleeding  MHD:QQIWL B Badgettis a 58 y.o.malewith medical history significant ofhypertension, hyperlipidemia, asthma, GERD, gout, anxiety, anemia,ESRD-HD (MWF),OSA on CPAP, e-cigarette smoking, 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 He also reports chills, no fever. He reports intermittent chest pain, which is located in the front chest, sometimes 10 out of 10 severity, currentlychest pain-free, no tenderness in the calf areas. Patient cannot characterize further if hischest pain is pleuritic or exertional. He reports headache in the frontal head. No unilateral weakness, numbness or tingling his extremities. No facial droop or slurred speech. Patient states that he has intermittent rectal bleeding with bright red-colored blood recently.He was seen by his GI, Dr. Ardis Hughs in 12/2017 and was told to takefiber supplements.He has nausea and vomiting sometimes, currently no abdominal pain or diarrhea. He denies symptoms of UTI. Pt states that he was seenby his pulmonologist on Friday, and hadanegative chest x-ray.  ED Course:pt was found to have positive troponin 0.03, positive d-dimer at 0.76, potassium 3.3, bicarbonate 29, creatinine 8.45, BUN 18, hemoglobin 7.8 (11.7 on 06/28/2017, 9.0 on 06/05/2017), BMP 51.2, temperature normal, no tachycardia, oxygen saturation 88-->100% on RA,chest x-ray with cardiomegaly and vascular congestion. Patient is placed on telemetry bed of observation.    Hospital course:  Acute diastolic heart failure Pulmonary vascular congestion on chest x-ray. Transthoracic Echocardiogram significant for a normal EF with grade 2 diastolic dysfunction. Patient reports symptoms consistent with orthopnea. Improved with HD. Counseled on proper diet and information given prior to discharge.  Asthma exacerbation Patient without a history of COPD, but rather asthma. Significant wheezing requiring IV steroids and breathing treatments. Patient's wheezing and exercise tolerance improved with treatment. Also given a course of azithromycin. Continue 5 day total course as an outpatient. Continue home inhalers.  Chest pain  CTA negative for PE. Associated mild troponin elevation. Cardiology consulted. And recommended no inpatient workup since Transthoracic Echocardiogram not significant for wall motion abnormalities or decreased EF.  ESRD on HD HD per nephrology  Tobacco abuse Patient counseled prior this hospitalization.  Hemorrhoids Diet recommendations made for patient. Patient sees outpatient GI for history of rectal bleeding with a recent colonoscopy significant for precancerous polyps. Hemoglobin stable.  Acute on chronic diastolic heart failure Resolved with HD. Follow-up outpatient with cardiology.  Discharge Diagnoses:  Principal Problem:   Acute respiratory failure with hypoxia (HCC) Active Problems:   GIB (gastrointestinal bleeding)   OSA on CPAP   HTN (hypertension)   Anemia in chronic renal disease  Pancytopenia ([place])   ESRD on dialysis [facility])   Chest pain   Hypokalemia   Cigarette smoker   Elevated troponin     03/31/2018  f/u ov/German Manke re: resumed smoking /HD MWF / cpap dr dohmeier  Chief Complaint  Patient presents with  . Follow-up    Breathing is overall doing well. He has not had to use the albuterol since the last visit.    Dyspnea:  MMRC3 = can't walk 100 yards even at a slow pace at a flat grade s stopping due to sob  (not able to reproduce this in office ) Cough: worse p supper / dry Sleeping: on cough ok on cpap  SABA use:  None  02: none  rec Work on inhaler technique:   Change symbicort to 80 Take 2 puffs first thing in am and then another 2 puffs about 12 hours later.  Only use your albuterol as a rescue medication   Please schedule a follow up visit in 3 months but call sooner if needed  with all medications /inhalers/ solutions in hand so we can verify exactly what you are taking. This includes all medications from all doctors and over the counters    07/08/2018  f/u ov/Kirk [name] re: AB still smoking  Chief Complaint  Patient presents with  . Follow-up    No  complaints today.   Dyspnea:  MMRC1 = can walk nl pace, flat grade, can't hurry or go uphills or steps s sob   Cough: hs / not in am / min mucoid  Sleeping: on side /2 pillow/cpap  SABA use: no rescue inhalers 02: none  Very confused with details of care  rec Plan A = Automatic = Symbicort 160 Take 2 puffs first thing in am and then another 2 puffs about 12 hours later.  Work on inhaler technique:  Plan B = Backup Only use your albuterol inhaler as a rescue medication The key is to stop smoking completely before smoking completely stops you! Please schedule a follow up visit in 3 months but call sooner if needed - bring inhalers      10/09/2018  f/u ov/Kirk [name] re: AB still smoking and confusing symb vs albuterol  Chief Complaint  Patient presents with  . Follow-up    Breathing is "pretty good".  No new co's.  Dyspnea:  MMRC2 = can't walk a nl pace on a flat grade s sob but does fine slow and flat  Cough: none at all now Sleeping: on side bed flat SABA use:  Rarely but wife not sure about this   02: none    No obvious day to day or daytime variability or assoc excess/ purulent sputum or mucus plugs or hemoptysis or cp or chest tightness, subjective wheeze or overt sinus or hb symptoms.   Sleeping as above  without nocturnal  or early am exacerbation  of respiratory  c/o's or need for noct saba. Also denies any obvious fluctuation of symptoms with weather or environmental changes or other aggravating or alleviating factors except as outlined above   No unusual exposure hx or h/o childhood pna/ asthma or knowledge of premature birth.  Current Allergies, Complete Past Medical History, Past Surgical History, Family History, and Social History were reviewed in [employer] record.  ROS  The following are not active complaints unless bolded Hoarseness, sore throat, dysphagia, dental problems, itching, sneezing,  nasal congestion or discharge of excess mucus or  purulent secretions, ear ache,   fever, chills,  sweats, unintended wt loss or wt gain, classically pleuritic or exertional cp,  orthopnea pnd or arm/hand swelling  or leg swelling, presyncope, palpitations, abdominal pain, anorexia, nausea, vomiting, diarrhea  or change in bowel habits or change in bladder habits, change in stools or change in urine, dysuria, hematuria,  rash, arthralgias, visual complaints, headache, numbness, weakness or ataxia or problems with walking or coordination,  change in mood or  memory.        Current Meds  Medication Sig  . acetaminophen (TYLENOL) 500 MG tablet Take 1 tablet (500 mg total) by mouth every 6 (six) hours as needed for moderate pain.  Marland Kitchen albuterol (PROVENTIL HFA;VENTOLIN HFA) 108 (90 Base) MCG/ACT inhaler Inhale 2 puffs into the lungs every 6 (six) hours as needed for wheezing or shortness of breath.  Marland Kitchen amLODipine (NORVASC) 10 MG tablet Take 1 tablet (10 mg total) by mouth daily.  Marland Kitchen aspirin EC 81 MG tablet Take 81 mg by mouth daily.  Marland Kitchen b complex-vitamin c-folic acid (NEPHRO-VITE) 0.8 MG TABS tablet TAKE 1 TABLET BY MOUTH EVERY DAY  . budesonide-formoterol (SYMBICORT) 160-4.5 MCG/ACT inhaler Inhale 2 puffs into the lungs every 12 (twelve) hours.  . calcitRIOL (ROCALTROL) 0.5 MCG capsule Take 2 capsules (1 mcg total) by mouth every Monday, Wednesday, and Friday with hemodialysis.  . Cholecalciferol (VITAMIN D3) 5000 UNITS TABS Take 5,000 Units by mouth daily.   . cinacalcet (SENSIPAR) 30 MG tablet Take 90 mg by mouth daily with supper.   . clonazePAM (KLONOPIN) 0.5 MG tablet Take 1 tablet (0.5 mg total) by mouth 2 (two) times daily as needed for anxiety.  . diphenhydrAMINE (BENADRYL) 25 MG tablet Take 50 mg by mouth at bedtime.   Marland Kitchen guaiFENesin (MUCINEX) 600 MG 12 hr tablet Take 1,200 mg by mouth 2 (two) times daily.  Marland Kitchen guaiFENesin-dextromethorphan (ROBITUSSIN DM) 100-10 MG/5ML syrup Take 10 mLs by mouth every 4 (four) hours as needed for cough.  . hydrALAZINE  (APRESOLINE) 100 MG tablet Take 1 tablet (100 mg total) by mouth 3 (three) times daily.  . metoprolol tartrate (LOPRESSOR) 100 MG tablet Take 1 tablet (100 mg total) by mouth 2 (two) times daily.  Marland Kitchen omeprazole (PRILOSEC) 20 MG capsule TAKE 1 CAPSULE TWICE DAILY BEFORE A MEAL  . ondansetron (ZOFRAN ODT) 4 MG disintegrating tablet Take 1 tablet (4 mg total) by mouth every 8 (eight) hours as needed for nausea or vomiting.  . pravastatin (PRAVACHOL) 40 MG tablet TAKE 1 TABLET EVERY EVENING  . PRESCRIPTION MEDICATION Inhale into the lungs at bedtime. CPAP  . sucroferric oxyhydroxide (VELPHORO) 500 MG chewable tablet Chew 500-1,500 mg by mouth See admin instructions. Chew 3 tablets (1500 mg) by mouth up to three times daily with meals and 1 tablet (500 mg) with snacks  . traMADol (ULTRAM) 50 MG tablet Take 1 tablet (50 mg total) by mouth daily as needed.   Current Facility-Administered Medications for the 10/09/18 encounter (Office Visit) with Tanda Rockers, MD  Medication  . ketoconazole (NIZORAL) 2 % cream             Objective:   Physical Exam  Pleasant amb bm nad    10/09/2018       213  07/08/2018       222 03/31/2018         229  12/30/2017       222  09/30/2017         222  07/11/2017       217  10/18/14 233 lb 12.8 oz (106.051 kg)  10/05/14 235 lb 3.2 oz (106.686 kg)  09/20/14 230 lb 6.1 oz (104.5 kg)    Vital signs reviewed - Note on arrival 02 sats  99% on RA    HEENT : pt wearing mask not removed for exam due to covid -19 concerns.    NECK :  without JVD/Nodes/TM/ nl carotid upstrokes bilaterally   LUNGS: no acc muscle use,  Nl contour chest with mild insp/exp rhonchi  bilaterally without cough on insp or exp maneuvers   CV:  RRR  no s3 or murmur or increase in P2, and no edema   ABD:  soft and nontender with nl inspiratory excursion in the supine position. No bruits or organomegaly appreciated, bowel sounds nl  MS:  Nl gait/ ext warm without deformities, calf  tenderness, cyanosis or clubbing No obvious joint restrictions  - shunt L arm   SKIN: warm and dry without lesions    NEURO:  alert, approp, nl sensorium with  no motor or cerebellar deficits apparent.          I personally reviewed images and agree with radiology impression as follows:  pCXR:   09/17/18  Findings suggestive of fluid overload/CHF with probable early interstitial edema.       Assessment & Plan:

## 2018-10-09 NOTE — Progress Notes (Signed)
Location:  [facility]   Place of Service:      Advanced Directives 10/09/2018  Does Patient Have a Medical Advance Directive? No  Does patient want to make changes to medical advance directive? -  Would patient like information on creating a medical advance directive? -  Pre-existing out of facility DNR order (yellow form or pink MOST form) -     Chief Complaint  Patient presents with  . Acute Visit    Has pain in bilateral knees, braces on both. Wife accompanies him.     HPI: Patient is a 58 y.o. male seen today for an acute visit for bilateral knee pain last over one week.   Wife present during visit.   He presents with knee immobilizers on both knees.  Last week, he was walking down a curb and felt his right knee buckle forward. Since initial injury, his right knee has become swollen and tender to touch near the patella. He also has limited ROM and pain when ambulating or bending the knee. He has a long history of right knee pain. Tylenol 1000 mg TID and Tramadol 50 mg are taken daily for pain. He is also icing the area and using aspercreme. He rates the pain a 8/10 when ambulating.   Left knee also painful with ambulation and swollen. The pain is less than the right. He rates it a 4/10. Besides taking tramadol and tylenol for the pain, he is also icing and using aspercreme.    Past Medical History:  Diagnosis Date  . Acute edema of lung, unspecified   . Acute, but ill-defined, cerebrovascular disease   . Allergy   . Anemia   . 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)   . Anxiety   . Asthma   . Asthma    moderate persistent  . Carpal tunnel syndrome   . Cellulitis and abscess of trunk   . Cholelithiasis 07/13/2014  . Chronic headaches   . Debility, unspecified   . Dermatophytosis of the body   . Dysrhythmia    history of  . Edema   . End stage renal disease on dialysis [facility])    "MWF; [facility]" (10/21/2014)  .  Essential hypertension, benign   . GERD (gastroesophageal reflux disease)   . Gout, unspecified   . HTN (hypertension)   . Hypertrophy of prostate without urinary obstruction and other lower urinary tract symptoms (LUTS)   . Hypotension, unspecified   . Impotence of organic origin   . Insomnia, unspecified   . Kidney replaced by transplant   . Localization-related (focal) (partial) epilepsy and epileptic syndromes with complex partial seizures, without mention of intractable epilepsy   . Lumbago   . Memory loss   . OSA on CPAP   . Other and unspecified hyperlipidemia   . Other chronic nonalcoholic liver disease   . Other malaise and fatigue   . Other nonspecific abnormal serum enzyme levels   . Pain in joint, lower leg   . Pain in joint, upper arm   . Pneumonia "several times"  . Renal dialysis status(V45.11) 02/05/2010   restarted 01/02/13 ofter renal trransplant failure  . Secondary hyperparathyroidism (of renal origin)   . Shortness of breath   . Sleep apnea   . Tension headache   . Unspecified constipation   . Unspecified essential hypertension   . Unspecified hereditary and idiopathic peripheral neuropathy   . Unspecified vitamin D deficiency     Past  Surgical History:  Procedure Laterality Date  . AV FISTULA PLACEMENT Left ?2010   "forearm; at [facility]"  . BACK SURGERY    . CARDIAC CATHETERIZATION  03/21/2011  . CHOLECYSTECTOMY N/A 10/21/2014   Procedure: LAPAROSCOPIC CHOLECYSTECTOMY WITH INTRAOPERATIVE CHOLANGIOGRAM;  Surgeon: Autumn Messing III, MD;  Location: [place];  Service: General;  Laterality: N/A;  . COLONOSCOPY    . 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 KIDNEY TRANSPLANT  08/17/2011   [facility]  10/21/2014   w/IOC  . LEFT HEART CATHETERIZATION WITH CORONARY ANGIOGRAM N/A 03/21/2011   Procedure: LEFT HEART CATHETERIZATION WITH CORONARY ANGIOGRAM;  Surgeon: [name], MD;  Location: [facility] CATH  LAB;  Service: Cardiovascular;  Laterality: N/A;  . NEPHRECTOMY  08/2013   removed transplaned kidney  . POSTERIOR FUSION CERVICAL SPINE  06/25/2012   for spinal stenosis  . VASECTOMY  2010    Allergies  Allergen Reactions  . Codeine Nausea And Vomiting    Outpatient Encounter Medications as of 10/09/2018  Medication Sig  . acetaminophen (TYLENOL) 500 MG tablet Take 1 tablet (500 mg total) by mouth every 6 (six) hours as needed for moderate pain.  Marland Kitchen albuterol (PROVENTIL HFA;VENTOLIN HFA) 108 (90 Base) MCG/ACT inhaler Inhale 2 puffs into the lungs every 6 (six) hours as needed for wheezing or shortness of breath.  Marland Kitchen amLODipine (NORVASC) 10 MG tablet Take 1 tablet (10 mg total) by mouth daily.  Marland Kitchen aspirin EC 81 MG tablet Take 81 mg by mouth daily.  Marland Kitchen b complex-vitamin c-folic acid (NEPHRO-VITE) 0.8 MG TABS tablet TAKE 1 TABLET BY MOUTH EVERY DAY  . budesonide-formoterol (SYMBICORT) 160-4.5 MCG/ACT inhaler Inhale 2 puffs into the lungs 2 (two) times daily.  . calcitRIOL (ROCALTROL) 0.5 MCG capsule Take 2 capsules (1 mcg total) by mouth every Monday, Wednesday, and Friday with hemodialysis.  . Cholecalciferol (VITAMIN D3) 5000 UNITS TABS Take 5,000 Units by mouth daily.   . cinacalcet (SENSIPAR) 30 MG tablet Take 90 mg by mouth daily with supper.   . clonazePAM (KLONOPIN) 0.5 MG tablet Take 1 tablet (0.5 mg total) by mouth 2 (two) times daily as needed for anxiety.  . diphenhydrAMINE (BENADRYL) 25 MG tablet Take 50 mg by mouth at bedtime.   Marland Kitchen guaiFENesin (MUCINEX) 600 MG 12 hr tablet Take 1,200 mg by mouth 2 (two) times daily.  Marland Kitchen guaiFENesin-dextromethorphan (ROBITUSSIN DM) 100-10 MG/5ML syrup Take 10 mLs by mouth every 4 (four) hours as needed for cough.  . hydrALAZINE (APRESOLINE) 100 MG tablet Take 1 tablet (100 mg total) by mouth 3 (three) times daily.  . metoprolol tartrate (LOPRESSOR) 100 MG tablet Take 1 tablet (100 mg total) by mouth 2 (two) times daily.  Marland Kitchen omeprazole (PRILOSEC) 20 MG  capsule TAKE 1 CAPSULE TWICE DAILY BEFORE A MEAL  . ondansetron (ZOFRAN ODT) 4 MG disintegrating tablet Take 1 tablet (4 mg total) by mouth every 8 (eight) hours as needed for nausea or vomiting.  . pravastatin (PRAVACHOL) 40 MG tablet TAKE 1 TABLET EVERY EVENING  . PRESCRIPTION MEDICATION Inhale into the lungs at bedtime. CPAP  . sucroferric oxyhydroxide (VELPHORO) 500 MG chewable tablet Chew 500-1,500 mg by mouth See admin instructions. Chew 3 tablets (1500 mg) by mouth up to three times daily with meals and 1 tablet (500 mg) with snacks  . traMADol (ULTRAM) 50 MG tablet Take 1 tablet (50 mg total) by mouth daily as needed.  . [DISCONTINUED] budesonide-formoterol (SYMBICORT)  160-4.5 MCG/ACT inhaler Inhale 2 puffs into the lungs every 12 (twelve) hours.   Facility-Administered Encounter Medications as of 10/09/2018  Medication  . ketoconazole (NIZORAL) 2 % cream    Review of Systems:  Review of Systems  Constitutional: Positive for activity change. Negative for unexpected weight change.  Musculoskeletal: Positive for arthralgias and myalgias.       Bilateral knee pain and swelling  Skin: Negative.   Neurological: Negative for weakness and numbness.  All other systems reviewed and are negative.   Health Maintenance  Topic Date Due  . FOOT EXAM  04/29/2014  . OPHTHALMOLOGY EXAM  10/09/2018 (Originally 01/29/1970)  . INFLUENZA VACCINE  04/22/2019 (Originally 08/23/2018)  . TETANUS/TDAP  10/09/2019 (Originally 01/23/2016)  . HEMOGLOBIN A1C  03/25/2019  . COLONOSCOPY  08/30/2019  . PNEUMOCOCCAL POLYSACCHARIDE VACCINE AGE 25-64 HIGH RISK  Completed  . Hepatitis C Screening  Completed  . HIV Screening  Completed    Physical Exam: Vitals:   10/09/18 1419  BP: (!) 150/74  Pulse: 64  Temp: 98 F (36.7 C)  TempSrc: Oral  SpO2: 96%  Weight: 214 lb 9.6 oz (97.3 kg)  Height: 5\' 7"  (1.702 m)   Body mass index is 33.61 kg/m. Physical Exam Vitals signs reviewed.  Constitutional:       Appearance: Normal appearance. He is normal weight.  Musculoskeletal:     Right knee: He exhibits decreased range of motion, effusion and MCL laxity. Tenderness found. Medial joint line tenderness noted.     Left knee: He exhibits effusion. He exhibits normal range of motion, no LCL laxity and no MCL laxity. No tenderness found.     Comments: No crepitus of left or right knee  Skin:      Neurological:     Mental Status: He is alert.     Labs reviewed: Basic Metabolic Panel: Recent Labs    01/28/18 0242  01/29/18 0655  06/02/18 0307  08/13/18 0613 09/17/18 1218 09/18/18 0459 09/19/18 0403  NA  --    < > 138   < > 143   < > 135 141 138 139  K  --    < > 4.6   < > 4.2   < > 3.6 3.7 4.3 3.8  CL  --    < > 99   < > 105   < > 96* 97* 97* 100  CO2  --    < > 30   < > 24   < > 23 32 28 27  GLUCOSE  --    < > 128*   < > 134*   < > 121* 126* 115* 110*  BUN  --    < > 20   < > 40*   < > 46* 24* 33* 22*  CREATININE  --    < > 7.98*   < > 14.16*   < > 13.97* 7.60* 9.63* 7.28*  CALCIUM  --    < > 9.8   < > 9.0   < > 8.7* 8.6* 8.6* 8.2*  MG  --   --  2.1  --   --   --   --   --   --   --   PHOS  --   --   --   --  8.4*  --  6.6*  --  6.3*  --   TSH 0.787  --   --   --   --   --   --   --   --   --    < > =  values in this interval not displayed.   Liver Function Tests: Recent Labs    06/01/18 2030  06/02/18 1044 08/12/18 1644 08/13/18 0613 09/18/18 0459  AST 24  --   --  27  --   --   ALT 19  --   --  35  --   --   ALKPHOS 59  --   --  87  --   --   BILITOT 1.5*  --  1.1 0.8  --   --   PROT 6.8  --   --  6.8  --   --   ALBUMIN 3.6   < >  --  3.6 3.3* 3.1*   < > = values in this interval not displayed.   No results for input(s): LIPASE, AMYLASE in the last 8760 hours. Recent Labs    08/12/18 1644  AMMONIA 34   CBC: Recent Labs    05/14/18 1453 06/01/18 2030  09/17/18 1218 09/18/18 0459 09/19/18 0403  WBC 4.6 5.1   < > 3.2* 3.4* 3.7*  NEUTROABS 3.5 3.5  --   --   --   2.4  HGB 9.6* 10.0*   < > 7.9* 7.8* 8.9*  HCT 31.0* 31.9*   < > 24.9* 24.7* 28.0*  MCV 102.0* 100.3*   < > 100.8* 101.6* 101.8*  PLT 136* 101*   < > PLATELET CLUMPS NOTED ON SMEAR, COUNT APPEARS ADEQUATE 90* 101*   < > = values in this interval not displayed.   Lipid Panel: Recent Labs    01/28/18 0242 09/25/18 1545  CHOL 106 123  HDL 23* 31*  LDLCALC 51 69  TRIG 160* 151*  CHOLHDL 4.6 4.0   Lab Results  Component Value Date   HGBA1C 5.4 09/25/2018    Procedures since last visit: Dg Chest Portable 1 View  Result Date: 09/17/2018 CLINICAL DATA:  Shortness of breath EXAM: PORTABLE CHEST 1 VIEW COMPARISON:  08/12/2018 FINDINGS: Cardiac silhouette is mildly enlarged, unchanged. Pulmonary vascular congestion. Streaky bibasilar opacities. No large pleural fluid collection. No pneumothorax. IMPRESSION: Findings suggestive of fluid overload/CHF with probable early interstitial edema. Electronically Signed   By: Davina Poke M.D.   On: 09/17/2018 11:20    Assessment/Plan 1. Right medial knee pain - suspect meniscus or medial collateral ligament involvement - referral to orthopedics - limited interventions due to long history of kidney disease - continue to use tramadol 50 mg daily and tylenol 1000 mg 2-3 times a day - continue to use knee immobilizers - Rest, ice, compress, elevate knee - use cane in left hand for more stability while ambulating  2. Primary osteoarthritis of left knee - normal ROM, effusion noted by patella - referral to orthopedics - suspect decreased cartilage in joint space - continue to  Tramadol 50 mg daily and tylenol 1000 mg 2-3 times a day - continue to use knee immobilizer - Rest, ice, compress, elevate knee      Labs/tests ordered:  none Next appt:  01/29/2019

## 2018-10-09 NOTE — Assessment & Plan Note (Addendum)
Active smoker - Spirometry 07/11/2017  Restrictive/ min curvature on no rx - 07/11/2017   Try symb 80 2bid - 09/30/2017     try symbicort 160 2bid as has rhonchi on exam, still smoking  - Spirometry 03/31/2018  FEV1 1.4 (41%)  Ratio 0.72 min curvature p symb 160 x 2 in am with poor baseline hfa   - The proper method of use, as well as anticipated side effects, of a metered-dose inhaler are discussed and demonstrated to the patient. Improved effectiveness after extensive coaching during this visit to a level of approximately 50 % from a baseline of 75 %  With Ti too short   Used golfer analogy to teach how he can use empty device to "take a few practice swings" before using the actual hfa device.  Advised re lopressor use (see separate a/p) may interfere with B 2 agonists but this is tricky as "cardiac asthma" can be caused by inadequate Beta blockade in this pt so not change rx for now

## 2018-10-10 DIAGNOSIS — Z992 Dependence on renal dialysis: Secondary | ICD-10-CM | POA: Diagnosis not present

## 2018-10-10 DIAGNOSIS — N186 End stage renal disease: Secondary | ICD-10-CM | POA: Diagnosis not present

## 2018-10-10 DIAGNOSIS — N2581 Secondary hyperparathyroidism of renal origin: Secondary | ICD-10-CM | POA: Diagnosis not present

## 2018-10-10 DIAGNOSIS — D631 Anemia in chronic kidney disease: Secondary | ICD-10-CM | POA: Diagnosis not present

## 2018-10-10 DIAGNOSIS — Z23 Encounter for immunization: Secondary | ICD-10-CM | POA: Diagnosis not present

## 2018-10-13 DIAGNOSIS — N186 End stage renal disease: Secondary | ICD-10-CM | POA: Diagnosis not present

## 2018-10-13 DIAGNOSIS — D631 Anemia in chronic kidney disease: Secondary | ICD-10-CM | POA: Diagnosis not present

## 2018-10-13 DIAGNOSIS — Z23 Encounter for immunization: Secondary | ICD-10-CM | POA: Diagnosis not present

## 2018-10-13 DIAGNOSIS — N2581 Secondary hyperparathyroidism of renal origin: Secondary | ICD-10-CM | POA: Diagnosis not present

## 2018-10-13 DIAGNOSIS — Z992 Dependence on renal dialysis: Secondary | ICD-10-CM | POA: Diagnosis not present

## 2018-10-15 DIAGNOSIS — N2581 Secondary hyperparathyroidism of renal origin: Secondary | ICD-10-CM | POA: Diagnosis not present

## 2018-10-15 DIAGNOSIS — Z992 Dependence on renal dialysis: Secondary | ICD-10-CM | POA: Diagnosis not present

## 2018-10-15 DIAGNOSIS — Z23 Encounter for immunization: Secondary | ICD-10-CM | POA: Diagnosis not present

## 2018-10-15 DIAGNOSIS — D631 Anemia in chronic kidney disease: Secondary | ICD-10-CM | POA: Diagnosis not present

## 2018-10-15 DIAGNOSIS — N186 End stage renal disease: Secondary | ICD-10-CM | POA: Diagnosis not present

## 2018-10-17 DIAGNOSIS — Z23 Encounter for immunization: Secondary | ICD-10-CM | POA: Diagnosis not present

## 2018-10-17 DIAGNOSIS — N186 End stage renal disease: Secondary | ICD-10-CM | POA: Diagnosis not present

## 2018-10-17 DIAGNOSIS — D631 Anemia in chronic kidney disease: Secondary | ICD-10-CM | POA: Diagnosis not present

## 2018-10-17 DIAGNOSIS — Z992 Dependence on renal dialysis: Secondary | ICD-10-CM | POA: Diagnosis not present

## 2018-10-17 DIAGNOSIS — N2581 Secondary hyperparathyroidism of renal origin: Secondary | ICD-10-CM | POA: Diagnosis not present

## 2018-10-19 NOTE — Progress Notes (Signed)
Subjective:  Patient ID: Kirk [name], male    DOB: Jun 17, 1960,  MRN: [id number]  Chief Complaint  Patient presents with  . Nail Problem    Pt states nail fungus 17 years duration, has tried OTC medication did not help. Bilateral 1-5 affected.    58 y.o. male presents with the above complaint. Hx as above.   Review of Systems: Negative except as noted in the HPI. Denies N/V/F/Ch.  Past Medical History:  Diagnosis Date  . Acute edema of lung, unspecified   . Acute, but ill-defined, cerebrovascular disease   . Allergy   . Anemia   . 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)   . Anxiety   . Asthma   . Asthma    moderate persistent  . Carpal tunnel syndrome   . Cellulitis and abscess of trunk   . Cholelithiasis 07/13/2014  . Chronic headaches   . Debility, unspecified   . Dermatophytosis of the body   . Dysrhythmia    history of  . Edema   . End stage renal disease on dialysis [facility])    "MWF; [facility]" (10/21/2014)  . Essential hypertension, benign   . GERD (gastroesophageal reflux disease)   . Gout, unspecified   . HTN (hypertension)   . Hypertrophy of prostate without urinary obstruction and other lower urinary tract symptoms (LUTS)   . Hypotension, unspecified   . Impotence of organic origin   . Insomnia, unspecified   . Kidney replaced by transplant   . Localization-related (focal) (partial) epilepsy and epileptic syndromes with complex partial seizures, without mention of intractable epilepsy   . Lumbago   . Memory loss   . OSA on CPAP   . Other and unspecified hyperlipidemia   . Other chronic nonalcoholic liver disease   . Other malaise and fatigue   . Other nonspecific abnormal serum enzyme levels   . Pain in joint, lower leg   . Pain in joint, upper arm   . Pneumonia "several times"  . Renal dialysis status(V45.11) 02/05/2010   restarted 01/02/13 ofter renal trransplant failure  . Secondary hyperparathyroidism (of renal origin)   .  Shortness of breath   . Sleep apnea   . Tension headache   . Unspecified constipation   . Unspecified essential hypertension   . Unspecified hereditary and idiopathic peripheral neuropathy   . Unspecified vitamin D deficiency     Current Outpatient Medications:  .  acetaminophen (TYLENOL) 500 MG tablet, Take 1 tablet (500 mg total) by mouth every 6 (six) hours as needed for moderate pain., Disp: , Rfl:  .  albuterol (PROVENTIL HFA;VENTOLIN HFA) 108 (90 Base) MCG/ACT inhaler, Inhale 2 puffs into the lungs every 6 (six) hours as needed for wheezing or shortness of breath., Disp: 1 Inhaler, Rfl: 0 .  aspirin EC 81 MG tablet, Take 81 mg by mouth daily., Disp: , Rfl:  .  b complex-vitamin c-folic acid (NEPHRO-VITE) 0.8 MG TABS tablet, TAKE 1 TABLET BY MOUTH EVERY DAY, Disp: 90 tablet, Rfl: 1 .  calcitRIOL (ROCALTROL) 0.5 MCG capsule, Take 2 capsules (1 mcg total) by mouth every Monday, Wednesday, and Friday with hemodialysis., Disp: 60 capsule, Rfl: 1 .  Cholecalciferol (VITAMIN D3) 5000 UNITS TABS, Take 5,000 Units by mouth daily. , Disp: , Rfl:  .  cinacalcet (SENSIPAR) 30 MG tablet, Take 90 mg by mouth daily with supper. , Disp: , Rfl:  .  clonazePAM (KLONOPIN) 0.5 MG tablet, Take 1 tablet (0.5 mg total) by mouth  2 (two) times daily as needed for anxiety., Disp: 60 tablet, Rfl: 0 .  diphenhydrAMINE (BENADRYL) 25 MG tablet, Take 50 mg by mouth at bedtime. , Disp: , Rfl:  .  guaiFENesin (MUCINEX) 600 MG 12 hr tablet, Take 1,200 mg by mouth 2 (two) times daily., Disp: , Rfl:  .  guaiFENesin-dextromethorphan (ROBITUSSIN DM) 100-10 MG/5ML syrup, Take 10 mLs by mouth every 4 (four) hours as needed for cough., Disp: , Rfl:  .  hydrALAZINE (APRESOLINE) 100 MG tablet, Take 1 tablet (100 mg total) by mouth 3 (three) times daily., Disp: 90 tablet, Rfl: 1 .  metoprolol tartrate (LOPRESSOR) 100 MG tablet, Take 1 tablet (100 mg total) by mouth 2 (two) times daily., Disp: 180 tablet, Rfl: 1 .  omeprazole  (PRILOSEC) 20 MG capsule, TAKE 1 CAPSULE TWICE DAILY BEFORE A MEAL, Disp: 180 capsule, Rfl: 3 .  ondansetron (ZOFRAN ODT) 4 MG disintegrating tablet, Take 1 tablet (4 mg total) by mouth every 8 (eight) hours as needed for nausea or vomiting., Disp: 20 tablet, Rfl: 0 .  pravastatin (PRAVACHOL) 40 MG tablet, TAKE 1 TABLET EVERY EVENING, Disp: 90 tablet, Rfl: 0 .  PRESCRIPTION MEDICATION, Inhale into the lungs at bedtime. CPAP, Disp: , Rfl:  .  sucroferric oxyhydroxide (VELPHORO) 500 MG chewable tablet, Chew 500-1,500 mg by mouth See admin instructions. Chew 3 tablets (1500 mg) by mouth up to three times daily with meals and 1 tablet (500 mg) with snacks, Disp: , Rfl:  .  traMADol (ULTRAM) 50 MG tablet, Take 1 tablet (50 mg total) by mouth daily as needed., Disp: 30 tablet, Rfl: 0 .  amLODipine (NORVASC) 10 MG tablet, Take 1 tablet (10 mg total) by mouth daily., Disp: 30 tablet, Rfl: 0 .  budesonide-formoterol (SYMBICORT) 160-4.5 MCG/ACT inhaler, Inhale 2 puffs into the lungs 2 (two) times daily., Disp: 1 Inhaler, Rfl: 0  Current Facility-Administered Medications:  .  ketoconazole (NIZORAL) 2 % cream, , Topical, Daily, Brennah Quraishi, Christian Mate, DPM  Social History   Tobacco Use  Smoking Status Current Every Day Smoker  . Packs/day: 0.50  . Years: 32.00  . Pack years: 16.00  . Types: Cigarettes  Smokeless Tobacco Never Used  Tobacco Comment   1 cigarette every couple days    Allergies  Allergen Reactions  . Codeine Nausea And Vomiting   Objective:   Vitals:   09/11/18 1511  Temp: 98.5 F (36.9 C)   There is no height or weight on file to calculate BMI. Constitutional Well developed. Well nourished.  Vascular Dorsalis pedis pulses palpable bilaterally. Posterior tibial pulses palpable bilaterally. Capillary refill normal to all digits.  No cyanosis or clubbing noted. Pedal hair growth normal.  Neurologic Normal speech. Oriented to person, place, and time. Epicritic sensation to light  touch grossly present bilaterally.  Dermatologic Nails thickened dystrophic black discoloration No open wounds. No skin lesions.  Orthopedic: Normal joint ROM without pain or crepitus bilaterally. No visible deformities. No bony tenderness.   Radiographs: none Assessment:   1. Pain due to onychomycosis of nail   2. ESRD on dialysis [facility])    Plan:  Patient was evaluated and treated and all questions answered.  Onychomycosis with ESRD on HD -Nails debrided x10 -Rx [place]  Procedure: Nail Debridement Rationale: Patient meets criteria for routine foot care due to ESRD on HD Type of Debridement: manual, sharp debridement. Instrumentation: Nail nipper, rotary burr. Number of Nails: 10   Return in about 3 months (around 12/12/2018) for Routine Foot Care -  ESRD on HD .

## 2018-10-20 DIAGNOSIS — N2581 Secondary hyperparathyroidism of renal origin: Secondary | ICD-10-CM | POA: Diagnosis not present

## 2018-10-20 DIAGNOSIS — N186 End stage renal disease: Secondary | ICD-10-CM | POA: Diagnosis not present

## 2018-10-20 DIAGNOSIS — D631 Anemia in chronic kidney disease: Secondary | ICD-10-CM | POA: Diagnosis not present

## 2018-10-20 DIAGNOSIS — Z23 Encounter for immunization: Secondary | ICD-10-CM | POA: Diagnosis not present

## 2018-10-20 DIAGNOSIS — Z992 Dependence on renal dialysis: Secondary | ICD-10-CM | POA: Diagnosis not present

## 2018-10-21 ENCOUNTER — Encounter: Payer: Self-pay | Admitting: Orthopaedic Surgery

## 2018-10-21 ENCOUNTER — Ambulatory Visit (INDEPENDENT_AMBULATORY_CARE_PROVIDER_SITE_OTHER): Payer: Medicare Other

## 2018-10-21 ENCOUNTER — Other Ambulatory Visit: Payer: Self-pay

## 2018-10-21 ENCOUNTER — Ambulatory Visit (INDEPENDENT_AMBULATORY_CARE_PROVIDER_SITE_OTHER): Payer: Medicare Other | Admitting: Internal Medicine

## 2018-10-21 ENCOUNTER — Ambulatory Visit (INDEPENDENT_AMBULATORY_CARE_PROVIDER_SITE_OTHER): Payer: Medicare Other | Admitting: Orthopaedic Surgery

## 2018-10-21 ENCOUNTER — Encounter: Payer: Self-pay | Admitting: Internal Medicine

## 2018-10-21 VITALS — BP 180/82 | HR 72 | Temp 97.5°F | Ht 67.0 in | Wt 214.0 lb

## 2018-10-21 DIAGNOSIS — I5021 Acute systolic (congestive) heart failure: Secondary | ICD-10-CM

## 2018-10-21 DIAGNOSIS — I428 Other cardiomyopathies: Secondary | ICD-10-CM

## 2018-10-21 DIAGNOSIS — G8929 Other chronic pain: Secondary | ICD-10-CM | POA: Diagnosis not present

## 2018-10-21 DIAGNOSIS — I10 Essential (primary) hypertension: Secondary | ICD-10-CM

## 2018-10-21 DIAGNOSIS — M25561 Pain in right knee: Secondary | ICD-10-CM

## 2018-10-21 DIAGNOSIS — M25562 Pain in left knee: Secondary | ICD-10-CM | POA: Diagnosis not present

## 2018-10-21 DIAGNOSIS — N186 End stage renal disease: Secondary | ICD-10-CM | POA: Diagnosis not present

## 2018-10-21 DIAGNOSIS — Z992 Dependence on renal dialysis: Secondary | ICD-10-CM

## 2018-10-21 MED ORDER — HYDRALAZINE HCL 100 MG PO TABS: 100.0000 mg | ORAL_TABLET | Freq: Three times a day (TID) | ORAL | 1 refills | Status: DC

## 2018-10-21 MED ORDER — LIDOCAINE HCL 1 % IJ SOLN
2.0000 mL | INTRAMUSCULAR | Status: AC | PRN
  Administered 2018-10-21: 11:00:00 2 mL

## 2018-10-21 MED ORDER — METHYLPREDNISOLONE ACETATE 40 MG/ML IJ SUSP
40.0000 mg | INTRAMUSCULAR | Status: AC | PRN
  Administered 2018-10-21: 11:00:00 40 mg via INTRA_ARTICULAR

## 2018-10-21 MED ORDER — BUPIVACAINE HCL 0.25 % IJ SOLN
2.0000 mL | INTRAMUSCULAR | Status: AC | PRN
  Administered 2018-10-21: 11:00:00 2 mL via INTRA_ARTICULAR

## 2018-10-21 MED ORDER — ISOSORBIDE MONONITRATE ER 30 MG PO TB24: 30.0000 mg | ORAL_TABLET | Freq: Every day | ORAL | 3 refills | Status: DC

## 2018-10-21 NOTE — Patient Instructions (Signed)
Medication Instructions:  START isosorbide mononitrate 30mg   Continue other current medications  If you need a refill on your cardiac medications before your next appointment, please call your pharmacy.    Follow-Up: At [facility], you and your health needs are our priority.  As part of our continuing mission to provide you with exceptional heart care, we have created designated Provider Care Teams.  These Care Teams include your primary Cardiologist (physician) and Advanced Practice Providers (APPs -  Physician Assistants and Nurse Practitioners) who all work together to provide you with the care you need, when you need it. You will need a follow up appointment in 3 months. You may see [name], MD or one of the following Advanced Practice Providers on your designated Care Team: [place], [place] . Fabian Sharp, PA-C  Any Other Special Instructions Will Be Listed Below (If Applicable).

## 2018-10-21 NOTE — Progress Notes (Signed)
Office Visit Note   Patient: Kirk [name]           Date of Birth: 01-01-1961           MRN: [id number] Visit Date: 10/21/2018              Requested by: Gayland Curry, DO [place],  [place] [address] PCP: Gayland Curry, DO   Assessment & Plan: Visit Diagnoses:  1. Chronic pain of both knees     Plan: Impression is bilateral knee pain likely arthritis flareup.  We injected the left, more symptomatic knee today with cortisone.  He is a dialysis patient so did not want inject both knees.  If he is still having pain in the right knee he will follow-up with [place] in 2 weeks time.  Call with concerns or questions in the meantime.  Follow-Up Instructions: Return if symptoms worsen or fail to improve.   Orders:  Orders Placed This Encounter  Procedures  . Large Joint Inj: L knee  . XR Knee Complete 4 Views Left   No orders of the defined types were placed in this encounter.     Procedures: Large Joint Inj: L knee on 10/21/2018 11:02 AM Indications: pain Details: 22 G needle, anterolateral approach Medications: 2 mL bupivacaine 0.25 %; 2 mL lidocaine 1 %; 40 mg methylPREDNISolone acetate 40 MG/ML      Clinical Data: No additional findings.   Subjective: Chief Complaint  Patient presents with  . Right Knee - Pain  . Left Knee - Pain    HPI patient is a pleasant 58 year old gentleman who presents our clinic today with bilateral knee pain left greater than right.  This began approximately 3 weeks ago when he stepped off a curb and hyperextended his left knee.  He has had moderate intermittent pain since.  He denies any mechanical symptoms or instability.  Any movement of the knee especially when applying pressure to the left lower extremity seems to aggravate things.  He is tried Tylenol without relief of symptoms.  He has also been wearing a knee brace which she thinks helps out a little.  He denies any radicular symptoms.  He notes that his right knee has been  hurting due to compensation.  Review of Systems as detailed in HPI.  All others reviewed and are negative.   Objective: Vital Signs: There were no vitals taken for this visit.  Physical Exam well-developed well-nourished gentleman in no acute distress.  Alert and oriented x3.  Ortho Exam examination of the left knee reveals a trace effusion.  Range of motion 0 to 110 degrees.  Medial joint line tenderness.  He is stable valgus varus stress.  Negative anterior drawer.  Mild patellofemoral crepitus.  He is neurovascular intact distally.  Right knee exam shows range of motion 0 to 120 degrees.  No joint line tenderness.  Minimal patellofemoral crepitus.  Ligaments are stable.  Is neurovascular intact distally.  Specialty Comments:  No specialty comments available.  Imaging: Xr Knee Complete 4 Views Left  Result Date: 10/21/2018 X-rays demonstrate mild to moderate joint space narrowing.  Otherwise, no acute findings    PMFS History: Patient Active Problem List   Diagnosis Date Noted  . Chronic systolic heart failure ([place]) 09/25/2018  . Moderate persistent asthma without complication 78/29/5621  . Fluid overload 09/17/2018  . Hypertensive urgency 09/17/2018  . Right knee pain 06/02/2018  . Right tibial fracture 06/02/2018  . Cigarette smoker 07/11/2017  .  Anxiety 05/30/2017  . Hypokalemia   . 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  . Shunt malfunction 07/03/2016  . Need for hepatitis C screening test 06/29/2015  . Myalgia 05/17/2015  . 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 Obesity hypoventilation syndrome ([place]) 12/09/2014  . Narcotic drug use 12/09/2014  . Hypersomnia with sleep apnea 12/09/2014  . SCC (squamous cell carcinoma), arm 11/09/2014  . Mild persistent chronic asthma without complication 62/94/7654  . ESRD on dialysis ([place]) 09/09/2014  . Essential hypertension 09/09/2014  . HLD (hyperlipidemia) 09/09/2014  . Pancytopenia ([place]) 04/28/2014  . Thrombocytopenia ([place])  04/10/2014  . Fungal dermatitis 03/17/2014  . S/p nephrectomy 09/17/2013  . Anemia 09/03/2012  . Uncontrolled hypertension   . Tension headache   . Memory loss   . Edema   . Thoracic or lumbosacral neuritis or radiculitis 03/27/2012  . Nerve root pain 03/27/2012  . History of kidney transplant 09/10/2011  . Type 2 diabetes mellitus with diabetic nephropathy, without long-term current use of insulin ([place]) 08/30/2011  . Hearing loss 08/16/2011  . Obesity 08/16/2011  . OSA on CPAP 08/05/2011  . GIB (gastrointestinal bleeding) 10/28/2007   Past Medical History:  Diagnosis Date  . Acute edema of lung, unspecified   . Acute, but ill-defined, cerebrovascular disease   . Allergy   . Anemia   . 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)   . Anxiety   . Asthma   . Asthma    moderate persistent  . Carpal tunnel syndrome   . Cellulitis and abscess of trunk   . Cholelithiasis 07/13/2014  . Chronic headaches   . Debility, unspecified   . Dermatophytosis of the body   . Dysrhythmia    history of  . Edema   . End stage renal disease on dialysis [facility])    "MWF; [facility]" (10/21/2014)  . Essential hypertension, benign   . GERD (gastroesophageal reflux disease)   . Gout, unspecified   . HTN (hypertension)   . Hypertrophy of prostate without urinary obstruction and other lower urinary tract symptoms (LUTS)   . Hypotension, unspecified   . Impotence of organic origin   . Insomnia, unspecified   . Kidney replaced by transplant   . Localization-related (focal) (partial) epilepsy and epileptic syndromes with complex partial seizures, without mention of intractable epilepsy   . Lumbago   . Memory loss   . OSA on CPAP   . Other and unspecified hyperlipidemia   . Other chronic nonalcoholic liver disease   . Other malaise and fatigue   . Other nonspecific abnormal serum enzyme levels   . Pain in joint, lower leg   . Pain in joint, upper arm   . Pneumonia "several times"   . Renal dialysis status(V45.11) 02/05/2010   restarted 01/02/13 ofter renal trransplant failure  . Secondary hyperparathyroidism (of renal origin)   . Shortness of breath   . Sleep apnea   . Tension headache   . Unspecified constipation   . Unspecified essential hypertension   . Unspecified hereditary and idiopathic peripheral neuropathy   . Unspecified vitamin D deficiency     Family History  Adopted: Yes  Problem Relation Age of Onset  . Colon cancer Neg Hx   . Esophageal cancer Neg Hx   . Rectal cancer Neg Hx   . Stomach cancer Neg Hx     Past Surgical History:  Procedure Laterality Date  . AV FISTULA PLACEMENT Left ?2010   "forearm; at [facility]"  . BACK SURGERY    .  CARDIAC CATHETERIZATION  03/21/2011  . CHOLECYSTECTOMY N/A 10/21/2014   Procedure: LAPAROSCOPIC CHOLECYSTECTOMY WITH INTRAOPERATIVE CHOLANGIOGRAM;  Surgeon: Autumn Messing III, MD;  Location: [place];  Service: General;  Laterality: N/A;  . COLONOSCOPY    . 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 KIDNEY TRANSPLANT  08/17/2011   [place]  10/21/2014   w/IOC  . LEFT HEART CATHETERIZATION WITH CORONARY ANGIOGRAM N/A 03/21/2011   Procedure: LEFT HEART CATHETERIZATION WITH CORONARY ANGIOGRAM;  Surgeon: [name], MD;  Location: [facility] CATH LAB;  Service: Cardiovascular;  Laterality: N/A;  . NEPHRECTOMY  08/2013   removed transplaned kidney  . POSTERIOR FUSION CERVICAL SPINE  06/25/2012   for spinal stenosis  . VASECTOMY  2010   Social History   Occupational History  . Occupation: disabled    Employer: DISABLED  Tobacco Use  . Smoking status: Current Every Day Smoker    Packs/day: 0.50    Years: 32.00    Pack years: 16.00    Types: Cigarettes  . Smokeless tobacco: Never Used  . Tobacco comment: 1 cigarette every couple days  Substance and Sexual Activity  . Alcohol use: No    Alcohol/week: 0.0 standard drinks  . Drug use: No  . Sexual activity:  Yes

## 2018-10-22 ENCOUNTER — Encounter: Payer: Self-pay | Admitting: Internal Medicine

## 2018-10-22 DIAGNOSIS — N2581 Secondary hyperparathyroidism of renal origin: Secondary | ICD-10-CM | POA: Diagnosis not present

## 2018-10-22 DIAGNOSIS — Z23 Encounter for immunization: Secondary | ICD-10-CM | POA: Diagnosis not present

## 2018-10-22 DIAGNOSIS — D631 Anemia in chronic kidney disease: Secondary | ICD-10-CM | POA: Diagnosis not present

## 2018-10-22 DIAGNOSIS — N186 End stage renal disease: Secondary | ICD-10-CM | POA: Diagnosis not present

## 2018-10-22 DIAGNOSIS — Z992 Dependence on renal dialysis: Secondary | ICD-10-CM | POA: Diagnosis not present

## 2018-10-22 NOTE — Addendum Note (Signed)
Addended by: [name] on: 10/22/2018 11:15 AM   Modules accepted: Level of Service

## 2018-10-22 NOTE — Progress Notes (Signed)
OFFICE NOTE  Chief Complaint:  Hospital follow-up for heart failure  Primary Care Physician: Gayland Curry, DO  HPI:  Kirk [name] is a 58 y.o. 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  At that time he was having chest pain associated with dialysis which was felt to be musculoskeletal.  His last echocardiogram was in August 2018 which showed mild LVH and EF 55 to 60%.  He presented in 01/2018 with chest pain, worsening shortness of breath and rectal bleeding.  The pain was described as sharp and mostly occurred after dialysis.  It last for a few minutes to a couple hours.  Symptoms are actually somewhat similar to his symptoms back in 2013.  CT angiography was negative for PE however did show bronchitis and small airway bronchiolitis.  He was placed on antibiotics for this.  Troponin was mildly elevated 0.03 and went up to 0.09.  EKG this morning actually showed A. fib with RVR however currently he is in sinus rhythm. I personally reviewed both studies.  His CHADSVASC score is 1, therefore I would not recommend anticoagulation be on aspirin.  Echo in 01/2018 showed LVEF 55% without regional WMA's.   10/21/2018  Kirk [name] was recently seen in follow-up in August by Doreene Adas, PA-C, who was seeing him for follow-up.  He had had a syncopal episode.  He had missed some dialysis in July and was volume overloaded.  He struggled with uncontrolled hypertension as well.  He is also had some sharp chest pain which radiated down his right arm.  She went ahead and ordered a nuclear stress test which was performed on 09/02/2018 which showed an LVEF of 38% with diffuse hypokinesis and a small fixed mild mid to apical inferior perfusion defect  thought to be attenuation artifact.  Given the decreased LVEF, I recommended a limited echo to verify the LV function.  This was performed on 09/16/2018 demonstrated global hypokinesis with LVEF of 40 to 45% and grade 2 diastolic dysfunction.  Since that time he has had improvement in his symptoms and is better volume optimized on dialysis.  Blood pressure remains elevated, particularly systolics.  Today the office blood pressure was 180/82.  Given end-stage renal disease, his options for heart failure therapy are limited.  As previously mentioned he had no significant coronary disease and large coronary arteries by cath in 2013 by myself.  PMHx:  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)    Anxiety    Asthma    Asthma    moderate persistent   Carpal tunnel syndrome    Cellulitis and abscess of trunk    Cholelithiasis 07/13/2014   Chronic headaches    Debility, unspecified    Dermatophytosis of the body    Dysrhythmia    history of   Edema    End stage renal disease on dialysis [facility])    "MWF; [facility]" (10/21/2014)   Essential hypertension, benign    GERD (gastroesophageal reflux disease)    Gout, unspecified    HTN (hypertension)    Hypertrophy of prostate without urinary obstruction and  other lower urinary tract symptoms (LUTS)    Hypotension, unspecified    Impotence of organic origin    Insomnia, unspecified    Kidney replaced by transplant    Localization-related (focal) (partial) epilepsy and epileptic syndromes with complex partial seizures, without mention of intractable epilepsy    Lumbago    Memory loss    OSA on CPAP    Other and unspecified hyperlipidemia    Other chronic nonalcoholic liver disease    Other malaise and fatigue    Other nonspecific abnormal serum  enzyme levels    Pain in joint, lower leg    Pain in joint, upper arm    Pneumonia "several times"   Renal dialysis status(V45.11) 02/05/2010   restarted 01/02/13 ofter renal trransplant failure   Secondary hyperparathyroidism (of renal origin)    Shortness of breath    Sleep apnea    Tension headache    Unspecified constipation    Unspecified essential hypertension    Unspecified hereditary and idiopathic peripheral neuropathy    Unspecified vitamin D deficiency     Past Surgical History:  Procedure Laterality Date   AV FISTULA PLACEMENT Left ?2010   "forearm; at [place]"   [place]  03/21/2011   CHOLECYSTECTOMY N/A 10/21/2014   Procedure: LAPAROSCOPIC CHOLECYSTECTOMY WITH INTRAOPERATIVE CHOLANGIOGRAM;  Surgeon: Autumn Messing III, MD;  Location: [place];  Service: General;  Laterality: N/A;   COLONOSCOPY     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   KIDNEY TRANSPLANT  08/17/2011   [place]  10/21/2014   w/IOC   LEFT HEART CATHETERIZATION WITH CORONARY ANGIOGRAM N/A 03/21/2011   Procedure: LEFT HEART CATHETERIZATION WITH CORONARY ANGIOGRAM;  Surgeon: [name], MD;  Location: [facility] CATH LAB;  Service: Cardiovascular;  Laterality: N/A;   NEPHRECTOMY  08/2013   removed transplaned kidney   POSTERIOR FUSION CERVICAL SPINE  06/25/2012   for spinal stenosis   VASECTOMY  2010    FAMHx:  Family History  Adopted: Yes  Problem Relation Age of Onset   Colon cancer Neg Hx    Esophageal cancer Neg Hx    Rectal cancer Neg Hx    Stomach cancer Neg Hx     SOCHx:   reports that he has been smoking cigarettes. He has a 16.00 pack-year smoking history. He has never used smokeless tobacco. He reports that he does not drink alcohol or use drugs.  ALLERGIES:  Allergies  Allergen Reactions   Codeine Nausea And Vomiting    ROS: Pertinent items noted in HPI and  remainder of comprehensive ROS otherwise negative.  HOME MEDS: Current Outpatient Medications on File Prior to Visit  Medication Sig Dispense Refill   acetaminophen (TYLENOL) 500 MG tablet Take 1 tablet (500 mg total) by mouth every 6 (six) hours as needed for moderate pain.     albuterol (PROVENTIL HFA;VENTOLIN HFA) 108 (90 Base) MCG/ACT inhaler Inhale 2 puffs into the lungs every 6 (six) hours as needed for wheezing or shortness of breath. 1 Inhaler 0   amLODipine (NORVASC) 10 MG tablet Take 1 tablet (10 mg total) by mouth daily. 30 tablet 0   aspirin EC 81 MG tablet Take 81 mg by mouth daily.     b complex-vitamin c-folic acid (NEPHRO-VITE) 0.8 MG TABS tablet TAKE 1 TABLET BY MOUTH EVERY DAY 90 tablet 1   budesonide-formoterol (SYMBICORT) 160-4.5 MCG/ACT inhaler Inhale 2 puffs into the  lungs 2 (two) times daily. 1 Inhaler 0   calcitRIOL (ROCALTROL) 0.5 MCG capsule Take 2 capsules (1 mcg total) by mouth every Monday, Wednesday, and Friday with hemodialysis. 60 capsule 1   Cholecalciferol (VITAMIN D3) 5000 UNITS TABS Take 5,000 Units by mouth daily.      cinacalcet (SENSIPAR) 30 MG tablet Take 90 mg by mouth daily with supper.      clonazePAM (KLONOPIN) 0.5 MG tablet Take 1 tablet (0.5 mg total) by mouth 2 (two) times daily as needed for anxiety. 60 tablet 0   diphenhydrAMINE (BENADRYL) 25 MG tablet Take 50 mg by mouth at bedtime.      guaiFENesin (MUCINEX) 600 MG 12 hr tablet Take 1,200 mg by mouth 2 (two) times daily.     guaiFENesin-dextromethorphan (ROBITUSSIN DM) 100-10 MG/5ML syrup Take 10 mLs by mouth every 4 (four) hours as needed for cough.     metoprolol tartrate (LOPRESSOR) 100 MG tablet Take 1 tablet (100 mg total) by mouth 2 (two) times daily. 180 tablet 1   omeprazole (PRILOSEC) 20 MG capsule TAKE 1 CAPSULE TWICE DAILY BEFORE A MEAL 180 capsule 3   ondansetron (ZOFRAN ODT) 4 MG disintegrating tablet Take 1 tablet (4 mg total) by mouth every 8 (eight) hours as needed  for nausea or vomiting. 20 tablet 0   pravastatin (PRAVACHOL) 40 MG tablet TAKE 1 TABLET EVERY EVENING 90 tablet 0   PRESCRIPTION MEDICATION Inhale into the lungs at bedtime. CPAP     sucroferric oxyhydroxide (VELPHORO) 500 MG chewable tablet Chew 500-1,500 mg by mouth See admin instructions. Chew 3 tablets (1500 mg) by mouth up to three times daily with meals and 1 tablet (500 mg) with snacks     traMADol (ULTRAM) 50 MG tablet Take 1 tablet (50 mg total) by mouth daily as needed. 30 tablet 0   Current Facility-Administered Medications on File Prior to Visit  Medication Dose Route Frequency Provider Last Rate Last Dose   ketoconazole (NIZORAL) 2 % cream   Topical Daily Evelina Bucy, DPM        LABS/IMAGING: No results found for this or any previous visit (from the past 48 hour(s)). Xr Knee Complete 4 Views Left  Result Date: 10/21/2018 X-rays demonstrate mild to moderate joint space narrowing.  Otherwise, no acute findings   LIPID PANEL:    Component Value Date/Time   CHOL 123 09/25/2018 1545   CHOL 195 06/29/2015 1533   TRIG 151 (H) 09/25/2018 1545   HDL 31 (L) 09/25/2018 1545   HDL 17 (L) 06/29/2015 1533   CHOLHDL 4.0 09/25/2018 1545   VLDL 32 01/28/2018 0242   LDLCALC 69 09/25/2018 1545     WEIGHTS: Wt Readings from Last 3 Encounters:  10/21/18 214 lb (97.1 kg)  10/09/18 214 lb 9.6 oz (97.3 kg)  10/09/18 213 lb (96.6 kg)    VITALS: BP (!) 180/82 (BP Location: Right Arm, Patient Position: Sitting, Cuff Size: Normal)    Pulse 72    Temp (!) 97.5 F (36.4 C)    Ht 5\' 7"  (1.702 m)    Wt 214 lb (97.1 kg)    BMI 33.52 kg/m   EXAM: General appearance: alert and no distress Neck: no carotid bruit, no JVD and thyroid not enlarged, symmetric, no tenderness/mass/nodules Lungs: clear to auscultation bilaterally Heart: regular rate and rhythm, S1, S2 normal, no murmur, click, rub or gallop Abdomen: soft, non-tender; bowel sounds normal; no masses,  no  organomegaly Extremities: extremities normal, atraumatic, no cyanosis or  edema Pulses: 2+ and symmetric Skin: Skin color, texture, turgor normal. No rashes or lesions Neurologic: Mental status: Alert, oriented, thought content appropriate, Noted hearing loss Psych: Pleasant  EKG: Normal sinus rhythm at 71, moderate voltage criteria for LVH- personally reviewed  ASSESSMENT: 1. Acute systolic congestive heart failure, suspect nonischemic cardiomyopathy-LVEF 40 to 45% (08/2018) 2. Low risk Myoview stress test with reduced LVEF to 38%, global hypokinesis (08/2018) 3. Angiographically normal, large caliber coronary arteries by cath (2013) 4. Uncontrolled hypertension 5. 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 Status post failed renal transplant - [facility] 7. 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   Kirk [name] has recent findings suggestive of acute systolic congestive heart failure however volume status is now optimized on dialysis.  LVEF is 40 to 45% and was 38% on Myoview stress testing.  There was no reversible ischemia.  His cath in 2013 showed angiographically normal coronaries therefore suspect this is a nonischemic cardiomyopathy, possibly related to uncontrolled hypertension.  Current heart failure medical therapy includes high-dose beta-blocker, max dose amlodipine, high-dose hydralazine. He is not a candidate for ACE inhibitor's, ARB's or ARNI's due to his ESRD.  Would recommend adding low-dose Imdur 30 mg daily.  Advised to monitor blood pressure closely.  He has follow-up soon with Dr. Mariea Clonts.  [name], MD, [facility], [place] Director of the Advanced Lipid Disorders &  Cardiovascular Risk Reduction Clinic Diplomate of the American Board of Clinical Lipidology Attending Cardiologist  Direct Dial: [phone]   Fax: [phone]  Website:  [email or web address] Maddelyn Rocca 10/22/2018, 10:59 AM

## 2018-10-23 DIAGNOSIS — N186 End stage renal disease: Secondary | ICD-10-CM | POA: Diagnosis not present

## 2018-10-23 DIAGNOSIS — Z992 Dependence on renal dialysis: Secondary | ICD-10-CM | POA: Diagnosis not present

## 2018-10-23 DIAGNOSIS — T8612 Kidney transplant failure: Secondary | ICD-10-CM | POA: Diagnosis not present

## 2018-10-24 DIAGNOSIS — Z992 Dependence on renal dialysis: Secondary | ICD-10-CM | POA: Diagnosis not present

## 2018-10-24 DIAGNOSIS — N186 End stage renal disease: Secondary | ICD-10-CM | POA: Diagnosis not present

## 2018-10-24 DIAGNOSIS — D509 Iron deficiency anemia, unspecified: Secondary | ICD-10-CM | POA: Diagnosis not present

## 2018-10-24 DIAGNOSIS — D631 Anemia in chronic kidney disease: Secondary | ICD-10-CM | POA: Diagnosis not present

## 2018-10-24 DIAGNOSIS — N2581 Secondary hyperparathyroidism of renal origin: Secondary | ICD-10-CM | POA: Diagnosis not present

## 2018-10-24 DIAGNOSIS — Z23 Encounter for immunization: Secondary | ICD-10-CM | POA: Diagnosis not present

## 2018-10-27 DIAGNOSIS — N2581 Secondary hyperparathyroidism of renal origin: Secondary | ICD-10-CM | POA: Diagnosis not present

## 2018-10-27 DIAGNOSIS — Z992 Dependence on renal dialysis: Secondary | ICD-10-CM | POA: Diagnosis not present

## 2018-10-27 DIAGNOSIS — D631 Anemia in chronic kidney disease: Secondary | ICD-10-CM | POA: Diagnosis not present

## 2018-10-27 DIAGNOSIS — Z23 Encounter for immunization: Secondary | ICD-10-CM | POA: Diagnosis not present

## 2018-10-27 DIAGNOSIS — D509 Iron deficiency anemia, unspecified: Secondary | ICD-10-CM | POA: Diagnosis not present

## 2018-10-27 DIAGNOSIS — N186 End stage renal disease: Secondary | ICD-10-CM | POA: Diagnosis not present

## 2018-10-29 DIAGNOSIS — N2581 Secondary hyperparathyroidism of renal origin: Secondary | ICD-10-CM | POA: Diagnosis not present

## 2018-10-29 DIAGNOSIS — Z23 Encounter for immunization: Secondary | ICD-10-CM | POA: Diagnosis not present

## 2018-10-29 DIAGNOSIS — N186 End stage renal disease: Secondary | ICD-10-CM | POA: Diagnosis not present

## 2018-10-29 DIAGNOSIS — D631 Anemia in chronic kidney disease: Secondary | ICD-10-CM | POA: Diagnosis not present

## 2018-10-29 DIAGNOSIS — Z992 Dependence on renal dialysis: Secondary | ICD-10-CM | POA: Diagnosis not present

## 2018-10-29 DIAGNOSIS — D509 Iron deficiency anemia, unspecified: Secondary | ICD-10-CM | POA: Diagnosis not present

## 2018-10-31 ENCOUNTER — Other Ambulatory Visit: Payer: Self-pay | Admitting: Internal Medicine

## 2018-10-31 DIAGNOSIS — Z992 Dependence on renal dialysis: Secondary | ICD-10-CM | POA: Diagnosis not present

## 2018-10-31 DIAGNOSIS — N2581 Secondary hyperparathyroidism of renal origin: Secondary | ICD-10-CM | POA: Diagnosis not present

## 2018-10-31 DIAGNOSIS — D631 Anemia in chronic kidney disease: Secondary | ICD-10-CM | POA: Diagnosis not present

## 2018-10-31 DIAGNOSIS — Z23 Encounter for immunization: Secondary | ICD-10-CM | POA: Diagnosis not present

## 2018-10-31 DIAGNOSIS — N186 End stage renal disease: Secondary | ICD-10-CM | POA: Diagnosis not present

## 2018-10-31 DIAGNOSIS — D509 Iron deficiency anemia, unspecified: Secondary | ICD-10-CM | POA: Diagnosis not present

## 2018-10-31 MED ORDER — AMLODIPINE BESYLATE 10 MG PO TABS: 10.0000 mg | ORAL_TABLET | Freq: Every day | ORAL | 11 refills | Status: DC

## 2018-10-31 NOTE — Telephone Encounter (Signed)
Please advise if ok to refill amlodpine 10 mg tablet qd. Last filled by [facility]

## 2018-10-31 NOTE — Telephone Encounter (Signed)
This is Dr. Hilty's pt 

## 2018-11-03 DIAGNOSIS — D509 Iron deficiency anemia, unspecified: Secondary | ICD-10-CM | POA: Diagnosis not present

## 2018-11-03 DIAGNOSIS — D631 Anemia in chronic kidney disease: Secondary | ICD-10-CM | POA: Diagnosis not present

## 2018-11-03 DIAGNOSIS — N2581 Secondary hyperparathyroidism of renal origin: Secondary | ICD-10-CM | POA: Diagnosis not present

## 2018-11-03 DIAGNOSIS — N186 End stage renal disease: Secondary | ICD-10-CM | POA: Diagnosis not present

## 2018-11-03 DIAGNOSIS — Z23 Encounter for immunization: Secondary | ICD-10-CM | POA: Diagnosis not present

## 2018-11-03 DIAGNOSIS — Z992 Dependence on renal dialysis: Secondary | ICD-10-CM | POA: Diagnosis not present

## 2018-11-05 DIAGNOSIS — Z23 Encounter for immunization: Secondary | ICD-10-CM | POA: Diagnosis not present

## 2018-11-05 DIAGNOSIS — D631 Anemia in chronic kidney disease: Secondary | ICD-10-CM | POA: Diagnosis not present

## 2018-11-05 DIAGNOSIS — D509 Iron deficiency anemia, unspecified: Secondary | ICD-10-CM | POA: Diagnosis not present

## 2018-11-05 DIAGNOSIS — N2581 Secondary hyperparathyroidism of renal origin: Secondary | ICD-10-CM | POA: Diagnosis not present

## 2018-11-05 DIAGNOSIS — Z992 Dependence on renal dialysis: Secondary | ICD-10-CM | POA: Diagnosis not present

## 2018-11-05 DIAGNOSIS — N186 End stage renal disease: Secondary | ICD-10-CM | POA: Diagnosis not present

## 2018-11-06 DIAGNOSIS — Z992 Dependence on renal dialysis: Secondary | ICD-10-CM | POA: Diagnosis not present

## 2018-11-06 DIAGNOSIS — N186 End stage renal disease: Secondary | ICD-10-CM | POA: Diagnosis not present

## 2018-11-06 DIAGNOSIS — I871 Compression of vein: Secondary | ICD-10-CM | POA: Diagnosis not present

## 2018-11-07 DIAGNOSIS — Z23 Encounter for immunization: Secondary | ICD-10-CM | POA: Diagnosis not present

## 2018-11-07 DIAGNOSIS — D631 Anemia in chronic kidney disease: Secondary | ICD-10-CM | POA: Diagnosis not present

## 2018-11-07 DIAGNOSIS — N186 End stage renal disease: Secondary | ICD-10-CM | POA: Diagnosis not present

## 2018-11-07 DIAGNOSIS — D509 Iron deficiency anemia, unspecified: Secondary | ICD-10-CM | POA: Diagnosis not present

## 2018-11-07 DIAGNOSIS — N2581 Secondary hyperparathyroidism of renal origin: Secondary | ICD-10-CM | POA: Diagnosis not present

## 2018-11-07 DIAGNOSIS — Z992 Dependence on renal dialysis: Secondary | ICD-10-CM | POA: Diagnosis not present

## 2018-11-10 DIAGNOSIS — D509 Iron deficiency anemia, unspecified: Secondary | ICD-10-CM | POA: Diagnosis not present

## 2018-11-10 DIAGNOSIS — D631 Anemia in chronic kidney disease: Secondary | ICD-10-CM | POA: Diagnosis not present

## 2018-11-10 DIAGNOSIS — Z992 Dependence on renal dialysis: Secondary | ICD-10-CM | POA: Diagnosis not present

## 2018-11-10 DIAGNOSIS — Z23 Encounter for immunization: Secondary | ICD-10-CM | POA: Diagnosis not present

## 2018-11-10 DIAGNOSIS — N186 End stage renal disease: Secondary | ICD-10-CM | POA: Diagnosis not present

## 2018-11-10 DIAGNOSIS — N2581 Secondary hyperparathyroidism of renal origin: Secondary | ICD-10-CM | POA: Diagnosis not present

## 2018-11-12 DIAGNOSIS — D631 Anemia in chronic kidney disease: Secondary | ICD-10-CM | POA: Diagnosis not present

## 2018-11-12 DIAGNOSIS — Z992 Dependence on renal dialysis: Secondary | ICD-10-CM | POA: Diagnosis not present

## 2018-11-12 DIAGNOSIS — Z23 Encounter for immunization: Secondary | ICD-10-CM | POA: Diagnosis not present

## 2018-11-12 DIAGNOSIS — N186 End stage renal disease: Secondary | ICD-10-CM | POA: Diagnosis not present

## 2018-11-12 DIAGNOSIS — N2581 Secondary hyperparathyroidism of renal origin: Secondary | ICD-10-CM | POA: Diagnosis not present

## 2018-11-12 DIAGNOSIS — D509 Iron deficiency anemia, unspecified: Secondary | ICD-10-CM | POA: Diagnosis not present

## 2018-11-14 DIAGNOSIS — N186 End stage renal disease: Secondary | ICD-10-CM | POA: Diagnosis not present

## 2018-11-14 DIAGNOSIS — Z23 Encounter for immunization: Secondary | ICD-10-CM | POA: Diagnosis not present

## 2018-11-14 DIAGNOSIS — Z992 Dependence on renal dialysis: Secondary | ICD-10-CM | POA: Diagnosis not present

## 2018-11-14 DIAGNOSIS — N2581 Secondary hyperparathyroidism of renal origin: Secondary | ICD-10-CM | POA: Diagnosis not present

## 2018-11-14 DIAGNOSIS — D631 Anemia in chronic kidney disease: Secondary | ICD-10-CM | POA: Diagnosis not present

## 2018-11-14 DIAGNOSIS — D509 Iron deficiency anemia, unspecified: Secondary | ICD-10-CM | POA: Diagnosis not present

## 2018-11-17 DIAGNOSIS — Z23 Encounter for immunization: Secondary | ICD-10-CM | POA: Diagnosis not present

## 2018-11-17 DIAGNOSIS — N186 End stage renal disease: Secondary | ICD-10-CM | POA: Diagnosis not present

## 2018-11-17 DIAGNOSIS — D509 Iron deficiency anemia, unspecified: Secondary | ICD-10-CM | POA: Diagnosis not present

## 2018-11-17 DIAGNOSIS — N2581 Secondary hyperparathyroidism of renal origin: Secondary | ICD-10-CM | POA: Diagnosis not present

## 2018-11-17 DIAGNOSIS — D631 Anemia in chronic kidney disease: Secondary | ICD-10-CM | POA: Diagnosis not present

## 2018-11-17 DIAGNOSIS — Z992 Dependence on renal dialysis: Secondary | ICD-10-CM | POA: Diagnosis not present

## 2018-11-19 DIAGNOSIS — D509 Iron deficiency anemia, unspecified: Secondary | ICD-10-CM | POA: Diagnosis not present

## 2018-11-19 DIAGNOSIS — D631 Anemia in chronic kidney disease: Secondary | ICD-10-CM | POA: Diagnosis not present

## 2018-11-19 DIAGNOSIS — Z992 Dependence on renal dialysis: Secondary | ICD-10-CM | POA: Diagnosis not present

## 2018-11-19 DIAGNOSIS — N186 End stage renal disease: Secondary | ICD-10-CM | POA: Diagnosis not present

## 2018-11-19 DIAGNOSIS — Z23 Encounter for immunization: Secondary | ICD-10-CM | POA: Diagnosis not present

## 2018-11-19 DIAGNOSIS — N2581 Secondary hyperparathyroidism of renal origin: Secondary | ICD-10-CM | POA: Diagnosis not present

## 2018-11-21 DIAGNOSIS — D631 Anemia in chronic kidney disease: Secondary | ICD-10-CM | POA: Diagnosis not present

## 2018-11-21 DIAGNOSIS — N186 End stage renal disease: Secondary | ICD-10-CM | POA: Diagnosis not present

## 2018-11-21 DIAGNOSIS — D509 Iron deficiency anemia, unspecified: Secondary | ICD-10-CM | POA: Diagnosis not present

## 2018-11-21 DIAGNOSIS — N2581 Secondary hyperparathyroidism of renal origin: Secondary | ICD-10-CM | POA: Diagnosis not present

## 2018-11-21 DIAGNOSIS — Z992 Dependence on renal dialysis: Secondary | ICD-10-CM | POA: Diagnosis not present

## 2018-11-21 DIAGNOSIS — Z23 Encounter for immunization: Secondary | ICD-10-CM | POA: Diagnosis not present

## 2018-11-23 DIAGNOSIS — T8612 Kidney transplant failure: Secondary | ICD-10-CM | POA: Diagnosis not present

## 2018-11-23 DIAGNOSIS — N186 End stage renal disease: Secondary | ICD-10-CM | POA: Diagnosis not present

## 2018-11-23 DIAGNOSIS — Z992 Dependence on renal dialysis: Secondary | ICD-10-CM | POA: Diagnosis not present

## 2018-11-24 DIAGNOSIS — N2581 Secondary hyperparathyroidism of renal origin: Secondary | ICD-10-CM | POA: Diagnosis not present

## 2018-11-24 DIAGNOSIS — Z992 Dependence on renal dialysis: Secondary | ICD-10-CM | POA: Diagnosis not present

## 2018-11-24 DIAGNOSIS — N186 End stage renal disease: Secondary | ICD-10-CM | POA: Diagnosis not present

## 2018-11-26 DIAGNOSIS — Z992 Dependence on renal dialysis: Secondary | ICD-10-CM | POA: Diagnosis not present

## 2018-11-26 DIAGNOSIS — N186 End stage renal disease: Secondary | ICD-10-CM | POA: Diagnosis not present

## 2018-11-26 DIAGNOSIS — N2581 Secondary hyperparathyroidism of renal origin: Secondary | ICD-10-CM | POA: Diagnosis not present

## 2018-11-28 DIAGNOSIS — N186 End stage renal disease: Secondary | ICD-10-CM | POA: Diagnosis not present

## 2018-11-28 DIAGNOSIS — Z992 Dependence on renal dialysis: Secondary | ICD-10-CM | POA: Diagnosis not present

## 2018-11-28 DIAGNOSIS — N2581 Secondary hyperparathyroidism of renal origin: Secondary | ICD-10-CM | POA: Diagnosis not present

## 2018-12-01 DIAGNOSIS — N186 End stage renal disease: Secondary | ICD-10-CM | POA: Diagnosis not present

## 2018-12-01 DIAGNOSIS — Z992 Dependence on renal dialysis: Secondary | ICD-10-CM | POA: Diagnosis not present

## 2018-12-01 DIAGNOSIS — N2581 Secondary hyperparathyroidism of renal origin: Secondary | ICD-10-CM | POA: Diagnosis not present

## 2018-12-02 ENCOUNTER — Inpatient Hospital Stay (HOSPITAL_COMMUNITY)
Admission: EM | Admit: 2018-12-02 | Discharge: 2018-12-05 | DRG: 189 | Disposition: A | Discharge status: Home / Self Care | Payer: Medicare Other | Attending: Family Medicine | Admitting: Family Medicine

## 2018-12-02 ENCOUNTER — Other Ambulatory Visit: Payer: Self-pay

## 2018-12-02 ENCOUNTER — Emergency Department (HOSPITAL_COMMUNITY): Payer: Medicare Other

## 2018-12-02 DIAGNOSIS — N186 End stage renal disease: Secondary | ICD-10-CM | POA: Diagnosis present

## 2018-12-02 DIAGNOSIS — I10 Essential (primary) hypertension: Secondary | ICD-10-CM | POA: Diagnosis not present

## 2018-12-02 DIAGNOSIS — E559 Vitamin D deficiency, unspecified: Secondary | ICD-10-CM | POA: Diagnosis present

## 2018-12-02 DIAGNOSIS — D631 Anemia in chronic kidney disease: Secondary | ICD-10-CM | POA: Diagnosis not present

## 2018-12-02 DIAGNOSIS — N2581 Secondary hyperparathyroidism of renal origin: Secondary | ICD-10-CM | POA: Diagnosis present

## 2018-12-02 DIAGNOSIS — E785 Hyperlipidemia, unspecified: Secondary | ICD-10-CM | POA: Diagnosis present

## 2018-12-02 DIAGNOSIS — Z20828 Contact with and (suspected) exposure to other viral communicable diseases: Secondary | ICD-10-CM | POA: Diagnosis not present

## 2018-12-02 DIAGNOSIS — J189 Pneumonia, unspecified organism: Secondary | ICD-10-CM | POA: Diagnosis present

## 2018-12-02 DIAGNOSIS — E1122 Type 2 diabetes mellitus with diabetic chronic kidney disease: Secondary | ICD-10-CM | POA: Diagnosis present

## 2018-12-02 DIAGNOSIS — H9190 Unspecified hearing loss, unspecified ear: Secondary | ICD-10-CM | POA: Diagnosis present

## 2018-12-02 DIAGNOSIS — R0689 Other abnormalities of breathing: Secondary | ICD-10-CM | POA: Diagnosis present

## 2018-12-02 DIAGNOSIS — I5022 Chronic systolic (congestive) heart failure: Secondary | ICD-10-CM | POA: Diagnosis present

## 2018-12-02 DIAGNOSIS — F17210 Nicotine dependence, cigarettes, uncomplicated: Secondary | ICD-10-CM | POA: Diagnosis present

## 2018-12-02 DIAGNOSIS — E8889 Other specified metabolic disorders: Secondary | ICD-10-CM | POA: Diagnosis present

## 2018-12-02 DIAGNOSIS — Z940 Kidney transplant status: Secondary | ICD-10-CM

## 2018-12-02 DIAGNOSIS — N400 Enlarged prostate without lower urinary tract symptoms: Secondary | ICD-10-CM | POA: Diagnosis present

## 2018-12-02 DIAGNOSIS — R300 Dysuria: Secondary | ICD-10-CM | POA: Diagnosis present

## 2018-12-02 DIAGNOSIS — N529 Male erectile dysfunction, unspecified: Secondary | ICD-10-CM | POA: Diagnosis present

## 2018-12-02 DIAGNOSIS — R319 Hematuria, unspecified: Secondary | ICD-10-CM | POA: Diagnosis present

## 2018-12-02 DIAGNOSIS — K219 Gastro-esophageal reflux disease without esophagitis: Secondary | ICD-10-CM | POA: Diagnosis present

## 2018-12-02 DIAGNOSIS — R059 Cough, unspecified: Secondary | ICD-10-CM

## 2018-12-02 DIAGNOSIS — G40209 Localization-related (focal) (partial) symptomatic epilepsy and epileptic syndromes with complex partial seizures, not intractable, without status epilepticus: Secondary | ICD-10-CM | POA: Diagnosis present

## 2018-12-02 DIAGNOSIS — J440 Chronic obstructive pulmonary disease with acute lower respiratory infection: Secondary | ICD-10-CM | POA: Diagnosis present

## 2018-12-02 DIAGNOSIS — Z992 Dependence on renal dialysis: Secondary | ICD-10-CM

## 2018-12-02 DIAGNOSIS — M109 Gout, unspecified: Secondary | ICD-10-CM | POA: Diagnosis present

## 2018-12-02 DIAGNOSIS — Z9989 Dependence on other enabling machines and devices: Secondary | ICD-10-CM

## 2018-12-02 DIAGNOSIS — R05 Cough: Secondary | ICD-10-CM

## 2018-12-02 DIAGNOSIS — E8770 Fluid overload, unspecified: Secondary | ICD-10-CM | POA: Diagnosis not present

## 2018-12-02 DIAGNOSIS — E1121 Type 2 diabetes mellitus with diabetic nephropathy: Secondary | ICD-10-CM | POA: Diagnosis not present

## 2018-12-02 DIAGNOSIS — J810 Acute pulmonary edema: Secondary | ICD-10-CM | POA: Diagnosis not present

## 2018-12-02 DIAGNOSIS — G4700 Insomnia, unspecified: Secondary | ICD-10-CM | POA: Diagnosis present

## 2018-12-02 DIAGNOSIS — I132 Hypertensive heart and chronic kidney disease with heart failure and with stage 5 chronic kidney disease, or end stage renal disease: Secondary | ICD-10-CM | POA: Diagnosis present

## 2018-12-02 DIAGNOSIS — G4733 Obstructive sleep apnea (adult) (pediatric): Secondary | ICD-10-CM | POA: Diagnosis not present

## 2018-12-02 DIAGNOSIS — B351 Tinea unguium: Secondary | ICD-10-CM | POA: Diagnosis not present

## 2018-12-02 DIAGNOSIS — R0602 Shortness of breath: Secondary | ICD-10-CM | POA: Diagnosis not present

## 2018-12-02 DIAGNOSIS — I160 Hypertensive urgency: Secondary | ICD-10-CM | POA: Diagnosis present

## 2018-12-02 DIAGNOSIS — Z20822 Contact with and (suspected) exposure to covid-19: Secondary | ICD-10-CM

## 2018-12-02 DIAGNOSIS — G609 Hereditary and idiopathic neuropathy, unspecified: Secondary | ICD-10-CM | POA: Diagnosis present

## 2018-12-02 DIAGNOSIS — R079 Chest pain, unspecified: Secondary | ICD-10-CM | POA: Diagnosis not present

## 2018-12-02 DIAGNOSIS — T8612 Kidney transplant failure: Secondary | ICD-10-CM | POA: Diagnosis present

## 2018-12-02 DIAGNOSIS — R0600 Dyspnea, unspecified: Secondary | ICD-10-CM | POA: Diagnosis not present

## 2018-12-02 DIAGNOSIS — J9601 Acute respiratory failure with hypoxia: Secondary | ICD-10-CM | POA: Diagnosis not present

## 2018-12-02 DIAGNOSIS — Z7982 Long term (current) use of aspirin: Secondary | ICD-10-CM

## 2018-12-02 DIAGNOSIS — I120 Hypertensive chronic kidney disease with stage 5 chronic kidney disease or end stage renal disease: Secondary | ICD-10-CM | POA: Diagnosis not present

## 2018-12-02 DIAGNOSIS — D649 Anemia, unspecified: Secondary | ICD-10-CM | POA: Diagnosis present

## 2018-12-02 DIAGNOSIS — Z905 Acquired absence of kidney: Secondary | ICD-10-CM

## 2018-12-02 DIAGNOSIS — Z79891 Long term (current) use of opiate analgesic: Secondary | ICD-10-CM

## 2018-12-02 DIAGNOSIS — Z79899 Other long term (current) drug therapy: Secondary | ICD-10-CM

## 2018-12-02 LAB — COMPREHENSIVE METABOLIC PANEL
ALT: 16 U/L (ref 0–44)
AST: 20 U/L (ref 15–41)
Albumin: 4 g/dL (ref 3.5–5.0)
Alkaline Phosphatase: 91 U/L (ref 38–126)
Anion gap: 19 — ABNORMAL HIGH (ref 5–15)
BUN: 48 mg/dL — ABNORMAL HIGH (ref 6–20)
CO2: 23 mmol/L (ref 22–32)
Calcium: 9 mg/dL (ref 8.9–10.3)
Chloride: 98 mmol/L (ref 98–111)
Creatinine, Ser: 12.83 mg/dL — ABNORMAL HIGH (ref 0.61–1.24)
GFR calc Af Amer: 4 mL/min — ABNORMAL LOW (ref >60)
GFR calc non Af Amer: 4 mL/min — ABNORMAL LOW (ref >60)
Glucose, Bld: 129 mg/dL — ABNORMAL HIGH (ref 70–99)
Potassium: 4.4 mmol/L (ref 3.5–5.1)
Sodium: 140 mmol/L (ref 135–145)
Total Bilirubin: 0.9 mg/dL (ref 0.3–1.2)
Total Protein: 7.6 g/dL (ref 6.5–8.1)

## 2018-12-02 LAB — POCT I-STAT 7, (LYTES, BLD GAS, ICA,H+H)
Acid-Base Excess: 2 mmol/L (ref 0.0–2.0)
Bicarbonate: 28.3 mmol/L — ABNORMAL HIGH (ref 20.0–28.0)
Calcium, Ion: 1.08 mmol/L — ABNORMAL LOW (ref 1.15–1.40)
HCT: 41 % (ref 39.0–52.0)
Hemoglobin: 13.9 g/dL (ref 13.0–17.0)
O2 Saturation: 99 %
Potassium: 4.1 mmol/L (ref 3.5–5.1)
Sodium: 140 mmol/L (ref 135–145)
TCO2: 30 mmol/L (ref 22–32)
pCO2 arterial: 47.2 mmHg (ref 32.0–48.0)
pH, Arterial: 7.386 (ref 7.350–7.450)
pO2, Arterial: 145 mmHg — ABNORMAL HIGH (ref 83.0–108.0)

## 2018-12-02 LAB — CBC WITH DIFFERENTIAL/PLATELET
Abs Immature Granulocytes: 0.02 10*3/uL (ref 0.00–0.07)
Basophils Absolute: 0.1 10*3/uL (ref 0.0–0.1)
Basophils Relative: 1 %
Eosinophils Absolute: 0 10*3/uL (ref 0.0–0.5)
Eosinophils Relative: 1 %
HCT: 44.1 % (ref 39.0–52.0)
Hemoglobin: 13.8 g/dL (ref 13.0–17.0)
Immature Granulocytes: 0 %
Lymphocytes Relative: 9 %
Lymphs Abs: 0.6 10*3/uL — ABNORMAL LOW (ref 0.7–4.0)
MCH: 29.9 pg (ref 26.0–34.0)
MCHC: 31.3 g/dL (ref 30.0–36.0)
MCV: 95.5 fL (ref 80.0–100.0)
Monocytes Absolute: 0.6 10*3/uL (ref 0.1–1.0)
Monocytes Relative: 9 %
Neutro Abs: 5.5 10*3/uL (ref 1.7–7.7)
Neutrophils Relative %: 80 %
Platelets: 98 10*3/uL — ABNORMAL LOW (ref 150–400)
RBC: 4.62 MIL/uL (ref 4.22–5.81)
RDW: 15.1 % (ref 11.5–15.5)
WBC: 6.9 10*3/uL (ref 4.0–10.5)
nRBC: 0 % (ref 0.0–0.2)

## 2018-12-02 LAB — BRAIN NATRIURETIC PEPTIDE: B Natriuretic Peptide: 1139.8 pg/mL — ABNORMAL HIGH (ref 0.0–100.0)

## 2018-12-02 LAB — PROTIME-INR
INR: UNDETERMINED (ref 0.8–1.2)
Prothrombin Time: UNDETERMINED seconds (ref 11.4–15.2)

## 2018-12-02 LAB — ETHANOL: Alcohol, Ethyl (B): <10 mg/dL (ref <10)

## 2018-12-02 LAB — TROPONIN I (HIGH SENSITIVITY): Troponin I (High Sensitivity): 27 ng/L — ABNORMAL HIGH (ref <18)

## 2018-12-02 MED ORDER — NITROGLYCERIN 2 % TD OINT
1.0000 [in_us] | TOPICAL_OINTMENT | Freq: Once | TRANSDERMAL | Status: AC
  Administered 2018-12-02: 1 [in_us] via TOPICAL
  Filled 2018-12-02: qty 1

## 2018-12-02 MED ORDER — LORAZEPAM 2 MG/ML IJ SOLN
0.5000 mg | Freq: Once | INTRAMUSCULAR | Status: AC
  Administered 2018-12-02: 21:00:00 0.5 mg via INTRAVENOUS
  Filled 2018-12-02: qty 1

## 2018-12-02 MED ORDER — LORAZEPAM 2 MG/ML IJ SOLN
0.5000 mg | Freq: Once | INTRAMUSCULAR | Status: AC
  Administered 2018-12-02: 0.5 mg via INTRAVENOUS
  Filled 2018-12-02: qty 1

## 2018-12-02 MED ORDER — NICOTINE 21 MG/24HR TD PT24
21.0000 mg | MEDICATED_PATCH | Freq: Every day | TRANSDERMAL | Status: DC
  Administered 2018-12-03 – 2018-12-04 (×3): 21 mg via TRANSDERMAL
  Filled 2018-12-02 (×3): qty 1

## 2018-12-02 MED ORDER — ACETAMINOPHEN 500 MG PO TABS
1000.0000 mg | ORAL_TABLET | Freq: Once | ORAL | Status: AC
  Administered 2018-12-02: 1000 mg via ORAL
  Filled 2018-12-02: qty 2

## 2018-12-02 NOTE — H&P (Signed)
History and Physical   JAIKOB BORGWARDT BZJ:[id number] DOB: September 12, 1960 DOA: 12/02/2018  Referring MD/NP/PA: Dr. Francia Greaves  PCP: Gayland Curry, DO   Outpatient Specialists: Dr. Debara Pickett, cardiology  Patient coming from: Home  Chief Complaint: Shortness of breath and cough  HPI: Kirk [name] is a 58 y.o. 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  Patient has been to dialysis yesterday.  Has had subjective fever.  He has had gradual worsening of his cough which has been persistent.  Was seen in the outpatient setting with COVID-19 screening ordered results still pending.  No known Covid exposure.  No chest pain.  Patient is unable to lay down flat.  He has PND and orthopnea.  He is here with his spouse who is the primary historian.  No one else is sick at home.  Evaluation indicated possible fluid overload versus pneumonia.  Patient is being admitted for treatment.  ED Course: Patient's temperature is 99 blood pressure 232/104 pulse 107 respiratory 34 oxygen sats 90% of 2 L.  His white count is 6.9 hemoglobin 13.9 and platelets of 98.  Sodium 140 potassium 4.0 chloride 98.  His BUN is 40 creatinine 12.83.  Glucose 129.  ABG showed a pH of 7.3 HC PCO2 47 PO2 145 on 4 L.  BNP 1139.  Point of 27.  Alcohol level less than 10.  Chest x-ray showed pulmonary edema with possible infiltrates.  Patient is being admitted to the hospital for treatment  Review of Systems: As per HPI otherwise 10 point review of systems negative.    Past Medical History:  Diagnosis Date  . Acute edema of lung, unspecified   . Acute, but ill-defined, cerebrovascular disease    . Allergy   . Anemia   . 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)   . Anxiety   . Asthma   . Asthma    moderate persistent  . Carpal tunnel syndrome   . Cellulitis and abscess of trunk   . Cholelithiasis 07/13/2014  . Chronic headaches   . Debility, unspecified   . Dermatophytosis of the body   . Dysrhythmia    history of  . Edema   . End stage renal disease on dialysis [facility])    "MWF; [facility]" (10/21/2014)  . Essential hypertension, benign   . GERD (gastroesophageal reflux disease)   . Gout, unspecified   . HTN (hypertension)   . Hypertrophy of prostate without urinary obstruction and other lower urinary tract symptoms (LUTS)   . Hypotension, unspecified   . Impotence of organic origin   . Insomnia, unspecified   . Kidney replaced by transplant   . Localization-related (focal) (partial) epilepsy and epileptic syndromes with complex partial seizures, without mention of intractable epilepsy   . Lumbago   . Memory loss   . OSA on CPAP   . Other and unspecified hyperlipidemia   . Other chronic nonalcoholic liver disease   . Other malaise and fatigue   . Other nonspecific abnormal serum enzyme levels   . Pain in joint, lower leg   . Pain in joint, upper arm   . Pneumonia "several times"  . Renal dialysis status(V45.11) 02/05/2010   restarted  01/02/13 ofter renal trransplant failure  . Secondary hyperparathyroidism (of renal origin)   . Shortness of breath   . Sleep apnea   . Tension headache   . Unspecified constipation   . Unspecified essential hypertension   . Unspecified hereditary and idiopathic peripheral neuropathy   . Unspecified vitamin D deficiency     Past Surgical History:  Procedure Laterality Date  . AV FISTULA PLACEMENT Left ?2010   "forearm; at [facility]"  . BACK SURGERY    . CARDIAC CATHETERIZATION  03/21/2011  . CHOLECYSTECTOMY N/A 10/21/2014   Procedure: LAPAROSCOPIC CHOLECYSTECTOMY WITH INTRAOPERATIVE  CHOLANGIOGRAM;  Surgeon: Autumn Messing III, MD;  Location: [place];  Service: General;  Laterality: N/A;  . COLONOSCOPY    . 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 KIDNEY TRANSPLANT  08/17/2011   [place]  10/21/2014   w/IOC  . LEFT HEART CATHETERIZATION WITH CORONARY ANGIOGRAM N/A 03/21/2011   Procedure: LEFT HEART CATHETERIZATION WITH CORONARY ANGIOGRAM;  Surgeon: [name], MD;  Location: [facility] CATH LAB;  Service: Cardiovascular;  Laterality: N/A;  . NEPHRECTOMY  08/2013   removed transplaned kidney  . POSTERIOR FUSION CERVICAL SPINE  06/25/2012   for spinal stenosis  . VASECTOMY  2010     reports that he has been smoking cigarettes. He has a 16.00 pack-year smoking history. He has never used smokeless tobacco. He reports that he does not drink alcohol or use drugs.  Allergies  Allergen Reactions  . Codeine Nausea And Vomiting    Family History  Adopted: Yes  Problem Relation Age of Onset  . Colon cancer Neg Hx   . Esophageal cancer Neg Hx   . Rectal cancer Neg Hx   . Stomach cancer Neg Hx      Prior to Admission medications   Medication Sig Start Date End Date Taking? Authorizing Provider  acetaminophen (TYLENOL) 500 MG tablet Take 1 tablet (500 mg total) by mouth every 6 (six) hours as needed for moderate pain. 09/12/14  Yes Samuella Cota, MD  albuterol (PROVENTIL HFA;VENTOLIN HFA) 108 (90 Base) MCG/ACT inhaler Inhale 2 puffs into the lungs every 6 (six) hours as needed for wheezing or shortness of breath. 06/05/17  Yes Patrecia Pour, MD  amLODipine (NORVASC) 10 MG tablet Take 1 tablet (10 mg total) by mouth daily. Patient taking differently: Take 10 mg by mouth at bedtime.  10/31/18  Yes [name], MD  aspirin EC 81 MG tablet Take 81 mg by mouth daily.   Yes [provider]  b complex-vitamin c-folic acid (NEPHRO-VITE) 0.8 MG TABS tablet TAKE 1 TABLET BY MOUTH EVERY DAY Patient taking differently: Take 1  tablet by mouth daily.  05/29/18  Yes Reed, Tiffany L, DO  budesonide-formoterol (SYMBICORT) 160-4.5 MCG/ACT inhaler Inhale 2 puffs into the lungs 2 (two) times daily. 10/09/18  Yes Tanda Rockers, MD  calcitRIOL (ROCALTROL) 0.5 MCG capsule Take 2 capsules (1 mcg total) by mouth every Monday, Wednesday, and Friday with hemodialysis. 06/04/18  Yes Black, Lezlie Octave, NP  Cholecalciferol (VITAMIN D3) 5000 UNITS TABS Take 5,000 Units by mouth daily.    Yes [provider]  cinacalcet (SENSIPAR) 30 MG tablet Take 90 mg by mouth daily with supper.  08/12/18  Yes [provider]  clonazePAM (KLONOPIN) 0.5 MG tablet Take 1 tablet (0.5 mg total) by mouth 2 (two) times daily as needed for anxiety. 09/02/18  Yes Reed, Tiffany L,  DO  diphenhydrAMINE (BENADRYL) 25 MG tablet Take 50 mg by mouth at bedtime.    Yes [provider]  guaiFENesin (MUCINEX) 600 MG 12 hr tablet Take 1,200 mg by mouth 2 (two) times daily.   Yes [provider]  hydrALAZINE (APRESOLINE) 100 MG tablet Take 1 tablet (100 mg total) by mouth 3 (three) times daily. 10/21/18  Yes Hilty, Nadean Corwin, MD  isosorbide mononitrate (IMDUR) 30 MG 24 hr tablet Take 1 tablet (30 mg total) by mouth daily. Patient taking differently: Take 30 mg by mouth at bedtime.  10/21/18 01/19/19 Yes Hilty, Nadean Corwin, MD  lidocaine-prilocaine (EMLA) cream Apply 1 application topically See admin instructions. Apply 1 to 2 hours prior to dialysis on Mondays, Wednesdays, and Fridays. Cover with saran wrap. 11/18/18  Yes [provider]  metoprolol tartrate (LOPRESSOR) 100 MG tablet Take 1 tablet (100 mg total) by mouth 2 (two) times daily. 08/28/18 08/28/19 Yes Reed, Tiffany L, DO  omeprazole (PRILOSEC) 20 MG capsule TAKE 1 CAPSULE TWICE DAILY BEFORE A MEAL Patient taking differently: Take 20 mg by mouth 2 (two) times daily before a meal.  03/18/18  Yes Milus Banister, MD  ondansetron (ZOFRAN ODT) 4 MG disintegrating tablet Take 1 tablet (4 mg  total) by mouth every 8 (eight) hours as needed for nausea or vomiting. 10/26/16  Yes Carmon Sails J, PA-C  pravastatin (PRAVACHOL) 40 MG tablet TAKE 1 TABLET EVERY EVENING Patient taking differently: Take 40 mg by mouth at bedtime.  05/16/18  Yes Reed, Tiffany L, DO  PRESCRIPTION MEDICATION Inhale into the lungs at bedtime. CPAP   Yes [provider]  sucroferric oxyhydroxide (VELPHORO) 500 MG chewable tablet Chew 500-1,500 mg by mouth See admin instructions. Chew 3 tablets (1500 mg) by mouth up to three times daily with meals and 1 tablet (500 mg) with snacks   Yes [provider]  traMADol (ULTRAM) 50 MG tablet Take 1 tablet (50 mg total) by mouth daily as needed. Patient taking differently: Take 50 mg by mouth at bedtime as needed for moderate pain.  08/28/18  Yes Gayland Curry, DO    Physical Exam: Vitals:   12/02/18 2230 12/02/18 2245 12/02/18 2300 12/02/18 2330  BP: (!) 188/87 (!) 191/87 (!) 179/89 (!) 194/86  Pulse: 92 92 88 89  Resp:      SpO2: 90% 92% 93% 94%  Weight:      Height:          Constitutional: Acutely ill looking, in mild distress Vitals:   12/02/18 2230 12/02/18 2245 12/02/18 2300 12/02/18 2330  BP: (!) 188/87 (!) 191/87 (!) 179/89 (!) 194/86  Pulse: 92 92 88 89  Resp:      SpO2: 90% 92% 93% 94%  Weight:      Height:       Eyes: PERRL, lids and conjunctivae normal ENMT: Mucous membranes are moist. Posterior pharynx clear of any exudate or lesions.Normal dentition.  Neck: normal, supple, no masses, no thyromegaly Respiratory: Decreased air entry bilaterally with marked crackles mild expiratory wheezing and rhonchi no accessory muscle use.  Cardiovascular: Sinus tachycardia,, no murmurs / rubs / gallops. No extremity edema. 2+ pedal pulses. No carotid bruits.  Abdomen: no tenderness, no masses palpated. No hepatosplenomegaly. Bowel sounds positive.  Musculoskeletal: no clubbing / cyanosis. No joint deformity upper and lower extremities.  Good ROM, no contractures. Normal muscle tone.  Skin: no rashes, lesions, ulcers. No induration Neurologic: CN 2-12 grossly intact. Sensation intact, DTR normal. Strength  5/5 in all 4.  Psychiatric: Drowsy not able to communicate due to ongoing cough, alert and oriented x 3. Normal mood.     Labs on Admission: I have personally reviewed following labs and imaging studies  CBC: Recent Labs  Lab 12/02/18 2031 12/02/18 2052  WBC 6.9  --   NEUTROABS 5.5  --   HGB 13.8 13.9  HCT 44.1 41.0  MCV 95.5  --   PLT 98*  --    Basic Metabolic Panel: Recent Labs  Lab 12/02/18 2031 12/02/18 2052  NA 140 140  K 4.4 4.1  CL 98  --   CO2 23  --   GLUCOSE 129*  --   BUN 48*  --   CREATININE 12.83*  --   CALCIUM 9.0  --    GFR: Estimated Creatinine Clearance: 7.1 mL/min (A) (by C-G formula based on SCr of 12.83 mg/dL (H)). Liver Function Tests: Recent Labs  Lab 12/02/18 2031  AST 20  ALT 16  ALKPHOS 91  BILITOT 0.9  PROT 7.6  ALBUMIN 4.0   No results for input(s): LIPASE, AMYLASE in the last 168 hours. No results for input(s): AMMONIA in the last 168 hours. Coagulation Profile: Recent Labs  Lab 12/02/18 2031  INR QUANTITY NOT SUFFICIENT, UNABLE TO PERFORM TEST   Cardiac Enzymes: No results for input(s): CKTOTAL, CKMB, CKMBINDEX, TROPONINI in the last 168 hours. BNP (last 3 results) No results for input(s): PROBNP in the last 8760 hours. HbA1C: No results for input(s): HGBA1C in the last 72 hours. CBG: No results for input(s): GLUCAP in the last 168 hours. Lipid Profile: No results for input(s): CHOL, HDL, LDLCALC, TRIG, CHOLHDL, LDLDIRECT in the last 72 hours. Thyroid Function Tests: No results for input(s): TSH, T4TOTAL, FREET4, T3FREE, THYROIDAB in the last 72 hours. Anemia Panel: No results for input(s): VITAMINB12, FOLATE, FERRITIN, TIBC, IRON, RETICCTPCT in the last 72 hours. Urine analysis:    Component Value Date/Time   COLORURINE YELLOW 02/19/2017 1520    APPEARANCEUR HAZY (A) 02/19/2017 1520   LABSPEC 1.012 02/19/2017 1520   PHURINE 8.0 02/19/2017 1520   GLUCOSEU 50 (A) 02/19/2017 1520   HGBUR MODERATE (A) 02/19/2017 1520   BILIRUBINUR NEGATIVE 02/19/2017 1520   KETONESUR NEGATIVE 02/19/2017 1520   PROTEINUR >=300 (A) 02/19/2017 1520   UROBILINOGEN 0.2 04/06/2014 1030   NITRITE NEGATIVE 02/19/2017 1520   LEUKOCYTESUR MODERATE (A) 02/19/2017 1520   Sepsis Labs: @LABRCNTIP (procalcitonin:4,lacticidven:4) )No results found for this or any previous visit (from the past 240 hour(s)).   Radiological Exams on Admission: Dg Chest Port 1 View  Result Date: 12/02/2018 CLINICAL DATA:  Dyspnea. EXAM: PORTABLE CHEST 1 VIEW COMPARISON:  09/17/2018 FINDINGS: The heart size remains enlarged. There are prominent interstitial lung markings bilaterally which appear similar to prior study. There is no pneumothorax. No large pleural effusion. No acute osseous abnormality. IMPRESSION: Cardiomegaly with prominent interstitial lung markings. This is similar to x-ray dated 09/17/2018. Differential considerations include congestive heart failure versus an atypical infectious process such as viral pneumonia. Electronically Signed   By: Constance Holster M.D.   On: 12/02/2018 20:51    EKG: Independently reviewed.  It shows sinus tachycardia with a rate of 106.  Evidence of LVH by voltage criteria, borderline QT interval prolongation.  Appears unchanged from previous EKG.  Assessment/Plan Principal Problem:   Dyspnea and respiratory abnormalities Active Problems:   OSA on CPAP   History of kidney transplant   Type 2 diabetes mellitus with diabetic nephropathy, without long-term  current use of insulin ([place])   ESRD on dialysis [facility])   Cigarette smoker   Fluid overload   Hypertensive urgency   Chronic systolic heart failure ([place])     #1 cough with shortness of breath: Patient's dyspnea is likely due to CHF but COVID-19 pneumonia possible.  We will admit the  patient and monitor closely.  Avoid his viral screen results.  Hemodialysis tomorrow.  He appears to have hypertensive urgency which could be the cause of there is flareup.  Nephrology and possibly cardiology consult  #2 hypertensive urgency: Patient's home regimen has been restarted.  We may consider hydralazine drip if needed.  Emergent hemodialysis in the morning may help also.  Monitor on telemetry  #3 end-stage renal disease: Hemodialysis on Mondays Wednesdays and Fridays.  Nephrology consult for the hemodialysis tomorrow  #4 diabetes: Sliding scale insulin.  Monitor CBG nightly and q. before meals  #5 obstructive sleep apnea: CPAP at night  #6 history of CHF: As per #1.  #7 tobacco abuse: Nicotine patch   DVT prophylaxis: SCD Code Status: Full code Family Communication: Wife at bedside Disposition Plan: Home Consults called: Consult nephrology and cardiology in the morning Admission status: Inpatient  Severity of Illness: The appropriate patient status for this patient is INPATIENT. Inpatient status is judged to be reasonable and necessary in order to provide the required intensity of service to ensure the patient's safety. The patient's presenting symptoms, physical exam findings, and initial radiographic and laboratory data in the context of their chronic comorbidities is felt to place them at high risk for further clinical deterioration. Furthermore, it is not anticipated that the patient will be medically stable for discharge from the hospital within 2 midnights of admission. The following factors support the patient status of inpatient.   " The patient's presenting symptoms include cough with shortness of breath. " The worrisome physical exam findings include bilateral basal crackle. " The initial radiographic and laboratory data are worrisome because of chest x-ray showing possible pneumonia. " The chronic co-morbidities include end-stage renal disease on hemodialysis.   *  I certify that at the point of admission it is my clinical judgment that the patient will require inpatient hospital care spanning beyond 2 midnights from the point of admission due to high intensity of service, high risk for further deterioration and high frequency of surveillance required.Barbette Merino MD [facility] Pager [phone]  If 7PM-7AM, please contact night-coverage [email or web address] Password [email or web address]  12/02/2018, 11:46 PM

## 2018-12-02 NOTE — ED Provider Notes (Addendum)
[place] EMERGENCY DEPARTMENT Provider Note   CSN: [id number] Arrival date & time: 12/02/18  2014     History   Chief Complaint Chief Complaint  Patient presents with  . Respiratory Distress    HPI Kirk [name] is a 58 y.o. male.     58 year old male with prior medical history as detailed below presents for evaluation of shortness of breath and cough.  Patient reports that his last dialysis treatment was yesterday.  This was completed without any difficulty.  He is due for dialysis again tomorrow.  Over the course of this morning he developed gradually worsening shortness of breath with associated cough.  Apparently, he did obtain an outpatient Covid test earlier today.  This has not yet resulted. He was advised to come to the ED if his symptoms worsened.  He presents tonight complaining of worsening cough with associated shortness of breath.  He denies fever.  He denies known Covid exposure.  He denies associated chest pain, nausea, vomiting, or other acute complaint.  The history is provided by the patient, medical records and the spouse.    Past Medical History:  Diagnosis Date  . Acute edema of lung, unspecified   . Acute, but ill-defined, cerebrovascular disease   . Allergy   . Anemia   . 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)   . Anxiety   . Asthma   . Asthma    moderate persistent  . Carpal tunnel syndrome   . Cellulitis and abscess of trunk   . Cholelithiasis 07/13/2014  . Chronic headaches   . Debility, unspecified   . Dermatophytosis of the body   . Dysrhythmia    history of  . Edema   . End stage renal disease on dialysis [facility])    "MWF; [facility]" (10/21/2014)  . Essential hypertension, benign   . GERD (gastroesophageal reflux disease)   . Gout, unspecified   . HTN (hypertension)   . Hypertrophy of prostate without urinary obstruction and other lower urinary tract symptoms (LUTS)   . Hypotension, unspecified    . Impotence of organic origin   . Insomnia, unspecified   . Kidney replaced by transplant   . Localization-related (focal) (partial) epilepsy and epileptic syndromes with complex partial seizures, without mention of intractable epilepsy   . Lumbago   . Memory loss   . OSA on CPAP   . Other and unspecified hyperlipidemia   . Other chronic nonalcoholic liver disease   . Other malaise and fatigue   . Other nonspecific abnormal serum enzyme levels   . Pain in joint, lower leg   . Pain in joint, upper arm   . Pneumonia "several times"  . Renal dialysis status(V45.11) 02/05/2010   restarted 01/02/13 ofter renal trransplant failure  . Secondary hyperparathyroidism (of renal origin)   . Shortness of breath   . Sleep apnea   . Tension headache   . Unspecified constipation   . Unspecified essential hypertension   . Unspecified hereditary and idiopathic peripheral neuropathy   . Unspecified vitamin D deficiency     Patient Active Problem List   Diagnosis Date Noted  . Chronic systolic heart failure ([place]) 09/25/2018  . Moderate persistent asthma without complication 24/26/8341  . Fluid overload 09/17/2018  . Hypertensive urgency 09/17/2018  . Right knee pain 06/02/2018  . Right tibial fracture 06/02/2018  . Cigarette smoker 07/11/2017  . Anxiety 05/30/2017  . Hypokalemia   . 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  . Shunt malfunction  07/03/2016  . Need for hepatitis C screening test 06/29/2015  . Myalgia 05/17/2015  . 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 Obesity hypoventilation syndrome ([place]) 12/09/2014  . Narcotic drug use 12/09/2014  . Hypersomnia with sleep apnea 12/09/2014  . SCC (squamous cell carcinoma), arm 11/09/2014  . Mild persistent chronic asthma without complication 86/76/1948  . ESRD on dialysis ([place]) 09/09/2014  . Essential hypertension 09/09/2014  . HLD (hyperlipidemia) 09/09/2014  . Pancytopenia ([place]) 04/28/2014  . Thrombocytopenia ([place]) 04/10/2014  . Fungal  dermatitis 03/17/2014  . S/p nephrectomy 09/17/2013  . Anemia 09/03/2012  . Uncontrolled hypertension   . Tension headache   . Memory loss   . Edema   . Thoracic or lumbosacral neuritis or radiculitis 03/27/2012  . Nerve root pain 03/27/2012  . History of kidney transplant 09/10/2011  . Type 2 diabetes mellitus with diabetic nephropathy, without long-term current use of insulin ([place]) 08/30/2011  . Hearing loss 08/16/2011  . Obesity 08/16/2011  . OSA on CPAP 08/05/2011  . GIB (gastrointestinal bleeding) 10/28/2007    Past Surgical History:  Procedure Laterality Date  . AV FISTULA PLACEMENT Left ?2010   "forearm; at [facility]"  . BACK SURGERY    . CARDIAC CATHETERIZATION  03/21/2011  . CHOLECYSTECTOMY N/A 10/21/2014   Procedure: LAPAROSCOPIC CHOLECYSTECTOMY WITH INTRAOPERATIVE CHOLANGIOGRAM;  Surgeon: Autumn Messing III, MD;  Location: [facility];  Service: General;  Laterality: N/A;  . COLONOSCOPY    . 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 KIDNEY TRANSPLANT  08/17/2011   [place]  10/21/2014   w/IOC  . LEFT HEART CATHETERIZATION WITH CORONARY ANGIOGRAM N/A 03/21/2011   Procedure: LEFT HEART CATHETERIZATION WITH CORONARY ANGIOGRAM;  Surgeon: [name], MD;  Location: [facility] CATH LAB;  Service: Cardiovascular;  Laterality: N/A;  . NEPHRECTOMY  08/2013   removed transplaned kidney  . POSTERIOR FUSION CERVICAL SPINE  06/25/2012   for spinal stenosis  . VASECTOMY  2010        Home Medications    Prior to Admission medications   Medication Sig Start Date End Date Taking? Authorizing Provider  acetaminophen (TYLENOL) 500 MG tablet Take 1 tablet (500 mg total) by mouth every 6 (six) hours as needed for moderate pain. 09/12/14   Samuella Cota, MD  albuterol (PROVENTIL HFA;VENTOLIN HFA) 108 (90 Base) MCG/ACT inhaler Inhale 2 puffs into the lungs every 6 (six) hours as needed for wheezing or shortness of breath. 06/05/17    Patrecia Pour, MD  amLODipine (NORVASC) 10 MG tablet Take 1 tablet (10 mg total) by mouth daily. 10/31/18   [name], MD  aspirin EC 81 MG tablet Take 81 mg by mouth daily.    [provider]  b complex-vitamin c-folic acid (NEPHRO-VITE) 0.8 MG TABS tablet TAKE 1 TABLET BY MOUTH EVERY DAY 05/29/18   Reed, Tiffany L, DO  budesonide-formoterol (SYMBICORT) 160-4.5 MCG/ACT inhaler Inhale 2 puffs into the lungs 2 (two) times daily. 10/09/18   Tanda Rockers, MD  calcitRIOL (ROCALTROL) 0.5 MCG capsule Take 2 capsules (1 mcg total) by mouth every Monday, Wednesday, and Friday with hemodialysis. 06/04/18   Black, Lezlie Octave, NP  Cholecalciferol (VITAMIN D3) 5000 UNITS TABS Take 5,000 Units by mouth daily.     [provider]  cinacalcet (SENSIPAR) 30 MG tablet Take 90 mg by mouth daily with supper.  08/12/18   [provider]  clonazePAM (KLONOPIN) 0.5 MG tablet Take  1 tablet (0.5 mg total) by mouth 2 (two) times daily as needed for anxiety. 09/02/18   Reed, Tiffany L, DO  diphenhydrAMINE (BENADRYL) 25 MG tablet Take 50 mg by mouth at bedtime.     [provider]  guaiFENesin (MUCINEX) 600 MG 12 hr tablet Take 1,200 mg by mouth 2 (two) times daily.    [provider]  guaiFENesin-dextromethorphan (ROBITUSSIN DM) 100-10 MG/5ML syrup Take 10 mLs by mouth every 4 (four) hours as needed for cough.    [provider]  hydrALAZINE (APRESOLINE) 100 MG tablet Take 1 tablet (100 mg total) by mouth 3 (three) times daily. 10/21/18   Hilty, Nadean Corwin, MD  isosorbide mononitrate (IMDUR) 30 MG 24 hr tablet Take 1 tablet (30 mg total) by mouth daily. 10/21/18 01/19/19  [name], MD  metoprolol tartrate (LOPRESSOR) 100 MG tablet Take 1 tablet (100 mg total) by mouth 2 (two) times daily. 08/28/18 08/28/19  Reed, Tiffany L, DO  omeprazole (PRILOSEC) 20 MG capsule TAKE 1 CAPSULE TWICE DAILY BEFORE A MEAL 03/18/18   Milus Banister, MD  ondansetron (ZOFRAN ODT) 4 MG  disintegrating tablet Take 1 tablet (4 mg total) by mouth every 8 (eight) hours as needed for nausea or vomiting. 10/26/16   Kinnie Feil, PA-C  pravastatin (PRAVACHOL) 40 MG tablet TAKE 1 TABLET EVERY EVENING 05/16/18   Reed, Tiffany L, DO  PRESCRIPTION MEDICATION Inhale into the lungs at bedtime. CPAP    [provider]  sucroferric oxyhydroxide (VELPHORO) 500 MG chewable tablet Chew 500-1,500 mg by mouth See admin instructions. Chew 3 tablets (1500 mg) by mouth up to three times daily with meals and 1 tablet (500 mg) with snacks    [provider]  traMADol (ULTRAM) 50 MG tablet Take 1 tablet (50 mg total) by mouth daily as needed. 08/28/18   Gayland Curry, DO    Family History Family History  Adopted: Yes  Problem Relation Age of Onset  . Colon cancer Neg Hx   . Esophageal cancer Neg Hx   . Rectal cancer Neg Hx   . Stomach cancer Neg Hx     Social History Social History   Tobacco Use  . Smoking status: Current Every Day Smoker    Packs/day: 0.50    Years: 32.00    Pack years: 16.00    Types: Cigarettes  . Smokeless tobacco: Never Used  . Tobacco comment: 1 cigarette every couple days  Substance Use Topics  . Alcohol use: No    Alcohol/week: 0.0 standard drinks  . Drug use: No     Allergies   Codeine   Review of Systems Review of Systems  Respiratory: Positive for cough and shortness of breath.   All other systems reviewed and are negative.    Physical Exam Updated Vital Signs BP (!) 232/104   Pulse (!) 107   Resp (!) 34   Ht 5\' 7"  (1.702 m)   Wt 97.1 kg   SpO2 100%   BMI 33.52 kg/m   Physical Exam Vitals signs and nursing note reviewed.  Constitutional:      General: He is in acute distress.     Appearance: Normal appearance. He is well-developed.     Comments: Coughing, talking in full sentences between coughing fits   HENT:     Head: Normocephalic and atraumatic.  Eyes:     Conjunctiva/sclera: Conjunctivae normal.      Pupils: Pupils are equal, round, and reactive to light.  Neck:     Musculoskeletal: Normal range of motion and neck supple.  Cardiovascular:     Rate and Rhythm: Normal rate and regular rhythm.     Pulses: Normal pulses.     Heart sounds: Normal heart sounds.  Pulmonary:     Effort: Respiratory distress present.     Breath sounds: Normal breath sounds.  Abdominal:     General: There is no distension.     Palpations: Abdomen is soft.     Tenderness: There is no abdominal tenderness.  Musculoskeletal: Normal range of motion.        General: No deformity.  Skin:    General: Skin is warm and dry.  Neurological:     General: No focal deficit present.     Mental Status: He is alert and oriented to person, place, and time. Mental status is at baseline.      ED Treatments / Results  Labs (all labs ordered are listed, but only abnormal results are displayed) Labs Reviewed  BRAIN NATRIURETIC PEPTIDE - Abnormal; Notable for the following components:      Result Value   B Natriuretic Peptide 1,139.8 (*)    All other components within normal limits  COMPREHENSIVE METABOLIC PANEL - Abnormal; Notable for the following components:   Glucose, Bld 129 (*)    BUN 48 (*)    Creatinine, Ser 12.83 (*)    GFR calc non Af Amer 4 (*)    GFR calc Af Amer 4 (*)    Anion gap 19 (*)    All other components within normal limits  CBC WITH DIFFERENTIAL/PLATELET - Abnormal; Notable for the following components:   Platelets 98 (*)    Lymphs Abs 0.6 (*)    All other components within normal limits  POCT I-STAT 7, (LYTES, BLD GAS, ICA,H+H) - Abnormal; Notable for the following components:   pO2, Arterial 145.0 (*)    Bicarbonate 28.3 (*)    Calcium, Ion 1.08 (*)    All other components within normal limits  SARS CORONAVIRUS 2 (TAT 6-24 HRS)  ETHANOL  PROTIME-INR  I-STAT ARTERIAL BLOOD GAS, ED  TROPONIN I (HIGH SENSITIVITY)  TROPONIN I (HIGH SENSITIVITY)    EKG EKG Interpretation  Date/Time:   Tuesday December 02 2018 20:30:07 EST Ventricular Rate:  106 PR Interval:    QRS Duration: 86 QT Interval:  367 QTC Calculation: 488 R Axis:   70 Text Interpretation: Sinus tachycardia Consider left ventricular hypertrophy Borderline prolonged QT interval Baseline wander in lead(s) V5 Confirmed by Dene Gentry [phone]) on 12/02/2018 8:36:45 PM   Radiology Dg Chest Port 1 View  Result Date: 12/02/2018 CLINICAL DATA:  Dyspnea. EXAM: PORTABLE CHEST 1 VIEW COMPARISON:  09/17/2018 FINDINGS: The heart size remains enlarged. There are prominent interstitial lung markings bilaterally which appear similar to prior study. There is no pneumothorax. No large pleural effusion. No acute osseous abnormality. IMPRESSION: Cardiomegaly with prominent interstitial lung markings. This is similar to x-ray dated 09/17/2018. Differential considerations include congestive heart failure versus an atypical infectious process such as viral pneumonia. Electronically Signed   By: Constance Holster M.D.   On: 12/02/2018 20:51    Procedures Procedures (including critical care time)  Medications Ordered in ED Medications  nitroGLYCERIN (NITROGLYN) 2 % ointment 1 inch (has no administration in time range)  LORazepam (ATIVAN) injection 0.5 mg (0.5 mg Intravenous Given 12/02/18 2037)     Initial Impression / Assessment and Plan / ED Course  I have reviewed the triage vital  signs and the nursing notes.  Pertinent labs & imaging results that were available during my care of the patient were reviewed by me and considered in my medical decision making (see chart for details).       MDM  Screen complete  Kirk [name] was evaluated in Emergency Department on 12/02/2018 for the symptoms described in the history of present illness. He was evaluated in the context of the global COVID-19 pandemic, which necessitated consideration that the patient might be at risk for infection with the SARS-CoV-2 virus that causes  COVID-19. Institutional protocols and algorithms that pertain to the evaluation of patients at risk for COVID-19 are in a state of rapid change based on information released by regulatory bodies including the CDC and federal and state organizations. These policies and algorithms were followed during the patient's care in the ED.  Patient is presenting for evaluation of reported shortness of breath and cough.  Covid test is pending.  Chest x-ray suggests potentially an element of fluid overload.  Patient is due for his next dialysis session tomorrow.  Please discussed with hospitalist service Jonelle Sidle) who will evaluate for admission.  Final Clinical Impressions(s) / ED Diagnoses   Final diagnoses:  Dyspnea, unspecified type    ED Discharge Orders    None       Valarie Merino, MD 12/02/18 2238    Valarie Merino, MD 12/02/18 2250

## 2018-12-02 NOTE — ED Triage Notes (Signed)
Pt began having shob and sweats today. MWF dialysis. Had full tx yesterday.  Notes hematuria x 2 days but "hardly makes urine" No known exposures to covid. Tested today.

## 2018-12-02 NOTE — ED Notes (Signed)
Pt placed on 4L via [place]. Sats remained 94 to 95%.  Pt increasingly agitated.

## 2018-12-03 ENCOUNTER — Inpatient Hospital Stay (HOSPITAL_COMMUNITY): Payer: Medicare Other

## 2018-12-03 DIAGNOSIS — Z992 Dependence on renal dialysis: Secondary | ICD-10-CM

## 2018-12-03 DIAGNOSIS — E8770 Fluid overload, unspecified: Secondary | ICD-10-CM

## 2018-12-03 DIAGNOSIS — E1121 Type 2 diabetes mellitus with diabetic nephropathy: Secondary | ICD-10-CM

## 2018-12-03 DIAGNOSIS — I5022 Chronic systolic (congestive) heart failure: Secondary | ICD-10-CM

## 2018-12-03 DIAGNOSIS — N186 End stage renal disease: Secondary | ICD-10-CM

## 2018-12-03 LAB — RENAL FUNCTION PANEL
Albumin: 4 g/dL (ref 3.5–5.0)
Anion gap: 18 — ABNORMAL HIGH (ref 5–15)
BUN: 29 mg/dL — ABNORMAL HIGH (ref 6–20)
CO2: 23 mmol/L (ref 22–32)
Calcium: 9.2 mg/dL (ref 8.9–10.3)
Chloride: 95 mmol/L — ABNORMAL LOW (ref 98–111)
Creatinine, Ser: 9.13 mg/dL — ABNORMAL HIGH (ref 0.61–1.24)
GFR calc Af Amer: 7 mL/min — ABNORMAL LOW (ref >60)
GFR calc non Af Amer: 6 mL/min — ABNORMAL LOW (ref >60)
Glucose, Bld: 112 mg/dL — ABNORMAL HIGH (ref 70–99)
Phosphorus: 5.4 mg/dL — ABNORMAL HIGH (ref 2.5–4.6)
Potassium: 4.1 mmol/L (ref 3.5–5.1)
Sodium: 136 mmol/L (ref 135–145)

## 2018-12-03 LAB — COMPREHENSIVE METABOLIC PANEL
ALT: 14 U/L (ref 0–44)
AST: 16 U/L (ref 15–41)
Albumin: 3.4 g/dL — ABNORMAL LOW (ref 3.5–5.0)
Alkaline Phosphatase: 81 U/L (ref 38–126)
Anion gap: 17 — ABNORMAL HIGH (ref 5–15)
BUN: 53 mg/dL — ABNORMAL HIGH (ref 6–20)
CO2: 24 mmol/L (ref 22–32)
Calcium: 8.6 mg/dL — ABNORMAL LOW (ref 8.9–10.3)
Chloride: 98 mmol/L (ref 98–111)
Creatinine, Ser: 13.84 mg/dL — ABNORMAL HIGH (ref 0.61–1.24)
GFR calc Af Amer: 4 mL/min — ABNORMAL LOW (ref >60)
GFR calc non Af Amer: 3 mL/min — ABNORMAL LOW (ref >60)
Glucose, Bld: 181 mg/dL — ABNORMAL HIGH (ref 70–99)
Potassium: 3.9 mmol/L (ref 3.5–5.1)
Sodium: 139 mmol/L (ref 135–145)
Total Bilirubin: 0.5 mg/dL (ref 0.3–1.2)
Total Protein: 6.8 g/dL (ref 6.5–8.1)

## 2018-12-03 LAB — CBC
HCT: 37.8 % — ABNORMAL LOW (ref 39.0–52.0)
HCT: 43.4 % (ref 39.0–52.0)
Hemoglobin: 11.9 g/dL — ABNORMAL LOW (ref 13.0–17.0)
Hemoglobin: 13.6 g/dL (ref 13.0–17.0)
MCH: 30.1 pg (ref 26.0–34.0)
MCH: 29.6 pg (ref 26.0–34.0)
MCHC: 31.5 g/dL (ref 30.0–36.0)
MCHC: 31.3 g/dL (ref 30.0–36.0)
MCV: 95.7 fL (ref 80.0–100.0)
MCV: 94.3 fL (ref 80.0–100.0)
Platelets: 89 10*3/uL — ABNORMAL LOW (ref 150–400)
Platelets: 86 10*3/uL — ABNORMAL LOW (ref 150–400)
RBC: 3.95 MIL/uL — ABNORMAL LOW (ref 4.22–5.81)
RBC: 4.6 MIL/uL (ref 4.22–5.81)
RDW: 15 % (ref 11.5–15.5)
RDW: 15.1 % (ref 11.5–15.5)
WBC: 6 10*3/uL (ref 4.0–10.5)
WBC: 6.3 10*3/uL (ref 4.0–10.5)
nRBC: 0 % (ref 0.0–0.2)
nRBC: 0 % (ref 0.0–0.2)

## 2018-12-03 LAB — TROPONIN I (HIGH SENSITIVITY): Troponin I (High Sensitivity): 31 ng/L — ABNORMAL HIGH (ref <18)

## 2018-12-03 LAB — SARS CORONAVIRUS 2 (TAT 6-24 HRS): SARS Coronavirus 2: NEGATIVE

## 2018-12-03 LAB — CBG MONITORING, ED: Glucose-Capillary: 97 mg/dL (ref 70–99)

## 2018-12-03 MED ORDER — GUAIFENESIN ER 600 MG PO TB12
1200.0000 mg | ORAL_TABLET | Freq: Two times a day (BID) | ORAL | Status: DC
  Administered 2018-12-03 – 2018-12-05 (×5): 1200 mg via ORAL
  Filled 2018-12-03 (×5): qty 2

## 2018-12-03 MED ORDER — LIDOCAINE-PRILOCAINE 2.5-2.5 % EX CREA
1.0000 "application " | TOPICAL_CREAM | CUTANEOUS | Status: DC
  Filled 2018-12-03: qty 5

## 2018-12-03 MED ORDER — HEPARIN SODIUM (PORCINE) 1000 UNIT/ML DIALYSIS: 20.0000 [IU]/kg | INTRAMUSCULAR | Status: DC | PRN

## 2018-12-03 MED ORDER — ALBUTEROL SULFATE (2.5 MG/3ML) 0.083% IN NEBU
2.5000 mg | INHALATION_SOLUTION | Freq: Four times a day (QID) | RESPIRATORY_TRACT | Status: DC | PRN
  Administered 2018-12-03 – 2018-12-05 (×2): 2.5 mg via RESPIRATORY_TRACT
  Filled 2018-12-03 (×2): qty 3

## 2018-12-03 MED ORDER — AZITHROMYCIN 250 MG PO TABS
500.0000 mg | ORAL_TABLET | Freq: Once | ORAL | Status: AC
  Administered 2018-12-03: 500 mg via ORAL
  Filled 2018-12-03: qty 2

## 2018-12-03 MED ORDER — ALTEPLASE 2 MG IJ SOLR
2.0000 mg | Freq: Once | INTRAMUSCULAR | Status: DC | PRN
  Filled 2018-12-03: qty 2

## 2018-12-03 MED ORDER — DOXERCALCIFEROL 4 MCG/2ML IV SOLN
7.0000 ug | INTRAVENOUS | Status: DC
  Filled 2018-12-03 (×2): qty 4

## 2018-12-03 MED ORDER — LIDOCAINE HCL (PF) 1 % IJ SOLN
5.0000 mL | INTRAMUSCULAR | Status: DC | PRN
  Filled 2018-12-03: qty 5

## 2018-12-03 MED ORDER — KETOCONAZOLE 2 % EX CREA
TOPICAL_CREAM | Freq: Every day | CUTANEOUS | Status: DC
  Administered 2018-12-03 – 2018-12-05 (×3): via TOPICAL
  Filled 2018-12-03 (×2): qty 15

## 2018-12-03 MED ORDER — CLONAZEPAM 0.5 MG PO TABS
0.5000 mg | ORAL_TABLET | Freq: Two times a day (BID) | ORAL | Status: DC | PRN
  Administered 2018-12-04: 0.5 mg via ORAL
  Filled 2018-12-03: qty 1

## 2018-12-03 MED ORDER — MOMETASONE FURO-FORMOTEROL FUM 200-5 MCG/ACT IN AERO
2.0000 | INHALATION_SPRAY | Freq: Two times a day (BID) | RESPIRATORY_TRACT | Status: DC
  Administered 2018-12-04 – 2018-12-05 (×2): 2 via RESPIRATORY_TRACT
  Filled 2018-12-03 (×2): qty 8.8

## 2018-12-03 MED ORDER — TRAMADOL HCL 50 MG PO TABS: 50.0000 mg | ORAL_TABLET | Freq: Every evening | ORAL | Status: DC | PRN

## 2018-12-03 MED ORDER — RENA-VITE PO TABS
1.0000 | ORAL_TABLET | Freq: Every day | ORAL | Status: DC
  Administered 2018-12-03 – 2018-12-04 (×3): 1 via ORAL
  Filled 2018-12-03 (×4): qty 1

## 2018-12-03 MED ORDER — DIPHENHYDRAMINE HCL 25 MG PO CAPS
50.0000 mg | ORAL_CAPSULE | Freq: Every day | ORAL | Status: DC
  Administered 2018-12-03 – 2018-12-04 (×3): 50 mg via ORAL
  Filled 2018-12-03 (×3): qty 2

## 2018-12-03 MED ORDER — HEPARIN SODIUM (PORCINE) 1000 UNIT/ML DIALYSIS
4000.0000 [IU] | Freq: Once | INTRAMUSCULAR | Status: AC
  Administered 2018-12-03: 12:00:00 4000 [IU] via INTRAVENOUS_CENTRAL

## 2018-12-03 MED ORDER — ISOSORBIDE MONONITRATE ER 30 MG PO TB24
30.0000 mg | ORAL_TABLET | Freq: Every day | ORAL | Status: DC
  Administered 2018-12-03 – 2018-12-04 (×3): 30 mg via ORAL
  Filled 2018-12-03 (×3): qty 1

## 2018-12-03 MED ORDER — SODIUM CHLORIDE 0.9 % IV SOLN: 100.0000 mL | INTRAVENOUS | Status: DC | PRN

## 2018-12-03 MED ORDER — PENTAFLUOROPROP-TETRAFLUOROETH EX AERO
1.0000 "application " | INHALATION_SPRAY | CUTANEOUS | Status: DC | PRN
  Filled 2018-12-03: qty 116

## 2018-12-03 MED ORDER — ENALAPRILAT 1.25 MG/ML IV SOLN
1.2500 mg | Freq: Once | INTRAVENOUS | Status: DC
  Filled 2018-12-03: qty 1

## 2018-12-03 MED ORDER — PANTOPRAZOLE SODIUM 40 MG PO TBEC
40.0000 mg | DELAYED_RELEASE_TABLET | Freq: Every day | ORAL | Status: DC
  Administered 2018-12-03 – 2018-12-05 (×3): 40 mg via ORAL
  Filled 2018-12-03 (×3): qty 1

## 2018-12-03 MED ORDER — ACETAMINOPHEN 500 MG PO TABS
500.0000 mg | ORAL_TABLET | Freq: Four times a day (QID) | ORAL | Status: DC | PRN
  Administered 2018-12-03: 500 mg via ORAL
  Filled 2018-12-03: qty 1

## 2018-12-03 MED ORDER — NITROGLYCERIN IN D5W 200-5 MCG/ML-% IV SOLN: 0.0000 ug/min | INTRAVENOUS | Status: DC

## 2018-12-03 MED ORDER — ALBUTEROL SULFATE HFA 108 (90 BASE) MCG/ACT IN AERS
2.0000 | INHALATION_SPRAY | Freq: Four times a day (QID) | RESPIRATORY_TRACT | Status: DC | PRN
  Filled 2018-12-03: qty 6.7

## 2018-12-03 MED ORDER — PRAVASTATIN SODIUM 40 MG PO TABS
40.0000 mg | ORAL_TABLET | Freq: Every day | ORAL | Status: DC
  Administered 2018-12-03 – 2018-12-04 (×3): 40 mg via ORAL
  Filled 2018-12-03 (×3): qty 1

## 2018-12-03 MED ORDER — LIDOCAINE-PRILOCAINE 2.5-2.5 % EX CREA
1.0000 "application " | TOPICAL_CREAM | CUTANEOUS | Status: DC | PRN
  Filled 2018-12-03: qty 5

## 2018-12-03 MED ORDER — CALCITRIOL 0.5 MCG PO CAPS
1.0000 ug | ORAL_CAPSULE | ORAL | Status: DC
  Filled 2018-12-03: qty 2

## 2018-12-03 MED ORDER — ASPIRIN EC 81 MG PO TBEC
81.0000 mg | DELAYED_RELEASE_TABLET | Freq: Every day | ORAL | Status: DC
  Administered 2018-12-03 – 2018-12-05 (×3): 81 mg via ORAL
  Filled 2018-12-03 (×3): qty 1

## 2018-12-03 MED ORDER — VITAMIN D 25 MCG (1000 UNIT) PO TABS
5000.0000 [IU] | ORAL_TABLET | Freq: Every day | ORAL | Status: DC
  Administered 2018-12-04 – 2018-12-05 (×2): 5000 [IU] via ORAL
  Filled 2018-12-03 (×2): qty 5

## 2018-12-03 MED ORDER — ONDANSETRON HCL 4 MG PO TABS: 4.0000 mg | ORAL_TABLET | Freq: Four times a day (QID) | ORAL | Status: DC | PRN

## 2018-12-03 MED ORDER — HYDRALAZINE HCL 50 MG PO TABS
100.0000 mg | ORAL_TABLET | Freq: Three times a day (TID) | ORAL | Status: DC
  Administered 2018-12-03 – 2018-12-05 (×6): 100 mg via ORAL
  Filled 2018-12-03 (×10): qty 2

## 2018-12-03 MED ORDER — CINACALCET HCL 30 MG PO TABS
90.0000 mg | ORAL_TABLET | Freq: Every day | ORAL | Status: DC
  Administered 2018-12-04: 90 mg via ORAL
  Filled 2018-12-03 (×2): qty 3

## 2018-12-03 MED ORDER — HEPARIN SODIUM (PORCINE) 1000 UNIT/ML DIALYSIS
1000.0000 [IU] | INTRAMUSCULAR | Status: DC | PRN
  Filled 2018-12-03: qty 1

## 2018-12-03 MED ORDER — ONDANSETRON HCL 4 MG/2ML IJ SOLN: 4.0000 mg | Freq: Four times a day (QID) | INTRAMUSCULAR | Status: DC | PRN

## 2018-12-03 MED ORDER — CHLORHEXIDINE GLUCONATE CLOTH 2 % EX PADS: 6.0000 | MEDICATED_PAD | Freq: Every day | CUTANEOUS | Status: DC

## 2018-12-03 MED ORDER — INSULIN ASPART 100 UNIT/ML ~~LOC~~ SOLN
0.0000 [IU] | Freq: Three times a day (TID) | SUBCUTANEOUS | Status: DC
  Administered 2018-12-04 – 2018-12-05 (×3): 1 [IU] via SUBCUTANEOUS

## 2018-12-03 MED ORDER — AMLODIPINE BESYLATE 10 MG PO TABS
10.0000 mg | ORAL_TABLET | Freq: Every day | ORAL | Status: DC
  Administered 2018-12-03 – 2018-12-04 (×3): 10 mg via ORAL
  Filled 2018-12-03 (×2): qty 1
  Filled 2018-12-03: qty 2

## 2018-12-03 MED ORDER — ONDANSETRON 4 MG PO TBDP: 4.0000 mg | ORAL_TABLET | Freq: Three times a day (TID) | ORAL | Status: DC | PRN

## 2018-12-03 MED ORDER — SODIUM CHLORIDE 0.9 % IV SOLN
1.0000 g | INTRAVENOUS | Status: DC
  Administered 2018-12-03 – 2018-12-04 (×2): 1 g via INTRAVENOUS
  Filled 2018-12-03 (×3): qty 10

## 2018-12-03 MED ORDER — SUCROFERRIC OXYHYDROXIDE 500 MG PO CHEW
1500.0000 mg | CHEWABLE_TABLET | Freq: Three times a day (TID) | ORAL | Status: DC
  Filled 2018-12-03 (×2): qty 3

## 2018-12-03 MED ORDER — SEVELAMER CARBONATE 800 MG PO TABS
2400.0000 mg | ORAL_TABLET | Freq: Three times a day (TID) | ORAL | Status: DC
  Administered 2018-12-04 – 2018-12-05 (×4): 2400 mg via ORAL
  Filled 2018-12-03 (×7): qty 3

## 2018-12-03 MED ORDER — SUCROFERRIC OXYHYDROXIDE 500 MG PO CHEW
500.0000 mg | CHEWABLE_TABLET | ORAL | Status: DC | PRN
  Filled 2018-12-03: qty 1

## 2018-12-03 MED ORDER — METOPROLOL TARTRATE 100 MG PO TABS
100.0000 mg | ORAL_TABLET | Freq: Two times a day (BID) | ORAL | Status: DC
  Administered 2018-12-03 – 2018-12-05 (×6): 100 mg via ORAL
  Filled 2018-12-03 (×2): qty 1
  Filled 2018-12-03 (×2): qty 4
  Filled 2018-12-03 (×2): qty 1

## 2018-12-03 MED ORDER — HEPARIN SODIUM (PORCINE) 1000 UNIT/ML IJ SOLN
INTRAMUSCULAR | Status: AC
  Administered 2018-12-03: 4000 [IU] via INTRAVENOUS_CENTRAL
  Filled 2018-12-03: qty 4

## 2018-12-03 NOTE — Progress Notes (Addendum)
PROGRESS NOTE    Kirk [name]  SNK:[id number] DOB: 1960/04/11 DOA: 12/02/2018 PCP: Gayland Curry, DO   Brief Narrative: Kirk [name] is a 58 y.o. male with medical history significant of systolic dysfunction CHF with EF 45% from August of this year, end-stage renal disease on hemodialysis Mondays Wednesdays and Fridays, history of failed kidney transplant, diabetes, tobacco abuse, obstructive sleep apnea, uncontrolled hypertension, asthma with COPD. Patient presented secondary to dyspnea and found to have fluid overload in setting of ESRD.   Assessment & Plan:   Principal Problem:   Dyspnea and respiratory abnormalities Active Problems:   OSA on CPAP   History of kidney transplant   Type 2 diabetes mellitus with diabetic nephropathy, without long-term current use of insulin (HCC)   ESRD on dialysis [facility])   Cigarette smoker   Fluid overload   Hypertensive urgency   Chronic systolic heart failure (HCC)   Dyspnea Secondary to fluid overload. Concern for possible acute heart failure but possibly related to ESRD and hypertension exacerbating fluid imbalance. Required BiPAP overnight and today. -HD per nephrology -Wean to room air  Acute respiratory failure with hypoxia Required BiPap. -Wean to room air  Hypertensive urgency In setting of fluid overload. Patient is on Imdur, hydralazine and amlodipine. Likely worse secondary to fluid overload. -Continue Imdur, hydralazine, amlodipine  Chronic systolic heart failure Recent EF of 40-45% on Transthoracic Echocardiogram from 8/25. -management with HD per nephrology  ESRD HD MWF. Nephrology consulted. -HD per nephrology  Cough Concern for pneumonia on admission. Started on ceftriaxone. Possibly related to fluid overload -Continue ceftriaxone in setting of below  Dysuria Patient already started on ceftriaxone on admission for above. Low risk for STDs per patient history. -Urinalysis, Urine culture -Continue  ceftriaxone  OSA -CPAP   DVT prophylaxis: SCDs Code Status:   Code Status: Full Code Family Communication: None Disposition Plan: Discharge pending wean to room air   Consultants:   Nephrology  Procedures:   HD  Antimicrobials:  Ceftriaxone    Subjective: Cough. Dysuria.  Objective: Vitals:   12/03/18 1300 12/03/18 1330 12/03/18 1400 12/03/18 1430  BP: (!) 154/79 (!) 175/86 (!) 142/73 (!) 190/95  Pulse: 70 71 60 78  Resp:      Temp:      TempSrc:      SpO2:      Weight:      Height:       No intake or output data in the 24 hours ending 12/03/18 1455 Filed Weights   12/02/18 2030 12/03/18 1135  Weight: 97.1 kg 98 kg    Examination:  General exam: Appears calm and comfortable Respiratory system: diminished. Respiratory effort normal. Cardiovascular system: S1 & S2 heard, RRR. No murmurs, rubs, gallops or clicks. Gastrointestinal system: Abdomen is nondistended, soft and nontender. No organomegaly or masses felt. Normal bowel sounds heard. Central nervous system: Alert and oriented. No focal neurological deficits. Extremities: No edema. No calf tenderness Skin: No cyanosis. No rashes Psychiatry: Judgement and insight appear normal. Mood & affect appropriate.     Data Reviewed: I have personally reviewed following labs and imaging studies  CBC: Recent Labs  Lab 12/02/18 2031 12/02/18 2052 12/03/18 0333  WBC 6.9  --  6.0  NEUTROABS 5.5  --   --   HGB 13.8 13.9 11.9*  HCT 44.1 41.0 37.8*  MCV 95.5  --  95.7  PLT 98*  --  89*   Basic Metabolic Panel: Recent Labs  Lab 12/02/18 2031 12/02/18 2052  12/03/18 0333  NA 140 140 139  K 4.4 4.1 3.9  CL 98  --  98  CO2 23  --  24  GLUCOSE 129*  --  181*  BUN 48*  --  53*  CREATININE 12.83*  --  13.84*  CALCIUM 9.0  --  8.6*   GFR: Estimated Creatinine Clearance: 6.7 mL/min (A) (by C-G formula based on SCr of 13.84 mg/dL (H)). Liver Function Tests: Recent Labs  Lab 12/02/18 2031 12/03/18  0333  AST 20 16  ALT 16 14  ALKPHOS 91 81  BILITOT 0.9 0.5  PROT 7.6 6.8  ALBUMIN 4.0 3.4*   No results for input(s): LIPASE, AMYLASE in the last 168 hours. No results for input(s): AMMONIA in the last 168 hours. Coagulation Profile: Recent Labs  Lab 12/02/18 2031  INR QUANTITY NOT SUFFICIENT, UNABLE TO PERFORM TEST   Cardiac Enzymes: No results for input(s): CKTOTAL, CKMB, CKMBINDEX, TROPONINI in the last 168 hours. BNP (last 3 results) No results for input(s): PROBNP in the last 8760 hours. HbA1C: No results for input(s): HGBA1C in the last 72 hours. CBG: Recent Labs  Lab 12/03/18 0822  GLUCAP 97   Lipid Profile: No results for input(s): CHOL, HDL, LDLCALC, TRIG, CHOLHDL, LDLDIRECT in the last 72 hours. Thyroid Function Tests: No results for input(s): TSH, T4TOTAL, FREET4, T3FREE, THYROIDAB in the last 72 hours. Anemia Panel: No results for input(s): VITAMINB12, FOLATE, FERRITIN, TIBC, IRON, RETICCTPCT in the last 72 hours. Sepsis Labs: No results for input(s): PROCALCITON, LATICACIDVEN in the last 168 hours.  Recent Results (from the past 240 hour(s))  SARS CORONAVIRUS 2 (TAT 6-24 HRS) Nasopharyngeal Nasopharyngeal Swab     Status: None   Collection Time: 12/02/18  8:35 PM   Specimen: Nasopharyngeal Swab  Result Value Ref Range Status   SARS Coronavirus 2 NEGATIVE NEGATIVE Final    Comment: (NOTE) SARS-CoV-2 target nucleic acids are NOT DETECTED. The SARS-CoV-2 RNA is generally detectable in upper and lower respiratory specimens during the acute phase of infection. Negative results do not preclude SARS-CoV-2 infection, do not rule out co-infections with other pathogens, and should not be used as the sole basis for treatment or other patient management decisions. Negative results must be combined with clinical observations, patient history, and epidemiological information. The expected result is Negative. Fact Sheet for Patients:  SugarRoll.be Fact Sheet for Healthcare Providers: https://www.woods-mathews.com/ This test is not yet approved or cleared by the [place] FDA and  has been authorized for detection and/or diagnosis of SARS-CoV-2 by FDA under an Emergency Use Authorization (EUA). This EUA will remain  in effect (meaning this test can be used) for the duration of the COVID-19 declaration under Section 56 4(b)(1) of the Act, 21 U.S.C. section 360bbb-3(b)(1), unless the authorization is terminated or revoked sooner. Performed at [facility], [place] [address], [place], [place] [address]          Radiology Studies: Dg Chest Port 1 View  Result Date: 12/02/2018 CLINICAL DATA:  Dyspnea. EXAM: PORTABLE CHEST 1 VIEW COMPARISON:  09/17/2018 FINDINGS: The heart size remains enlarged. There are prominent interstitial lung markings bilaterally which appear similar to prior study. There is no pneumothorax. No large pleural effusion. No acute osseous abnormality. IMPRESSION: Cardiomegaly with prominent interstitial lung markings. This is similar to x-ray dated 09/17/2018. Differential considerations include congestive heart failure versus an atypical infectious process such as viral pneumonia. Electronically Signed   By: Constance Holster M.D.   On: 12/02/2018 20:51  Scheduled Meds: . amLODipine  10 mg Oral QHS  . aspirin EC  81 mg Oral Daily  . Chlorhexidine Gluconate Cloth  6 each Topical Q0600  . cholecalciferol  5,000 Units Oral Daily  . cinacalcet  90 mg Oral Q supper  . diphenhydrAMINE  50 mg Oral QHS  . doxercalciferol  7 mcg Intravenous Q M,W,F-HD  . guaiFENesin  1,200 mg Oral BID  . hydrALAZINE  100 mg Oral Q8H  . insulin aspart  0-9 Units Subcutaneous TID WC  . isosorbide mononitrate  30 mg Oral QHS  . ketoconazole   Topical Daily  . ketoconazole   Topical Daily  . lidocaine-prilocaine  1 application Topical Q M,W,F-HD  . metoprolol  tartrate  100 mg Oral BID  . mometasone-formoterol  2 puff Inhalation BID  . multivitamin  1 tablet Oral QHS  . nicotine  21 mg Transdermal Daily  . pantoprazole  40 mg Oral Daily  . pravastatin  40 mg Oral QHS  . sevelamer carbonate  2,400 mg Oral TID WC   Continuous Infusions: . sodium chloride    . sodium chloride    . cefTRIAXone (ROCEPHIN)  IV Stopped (12/03/18 0436)     LOS: 1 day     Cordelia Poche, MD [facility] 12/03/2018, 2:55 PM  If 7PM-7AM, please contact night-coverage [email or web address]

## 2018-12-03 NOTE — Consult Note (Addendum)
[facility] Renal Consultation Note    Indication for Consultation:  Management of ESRD/hemodialysis, anemia, hypertension/volume, and secondary hyperparathyroidism.  HPI: Kirk [name] is a 58 y.o. male with a PMH including ESRD on dialysis, CHF with EF 45%, failed kidney transplant, OSA, HTN, and asthma who presented to the ED on 12/02/18 with shortness of breath and cough x 24 hours. He also reported orthopnea and PND. Denied chest pain on initial exam. BP was severely elevated at 232/105, pulse 107, O2 sat 90% on 2L. K+ 3.9, BUN 53, Cr 13.84, WBC 6.0, Hgb 11.9. CXR on 11/20 consistent with CHF vs atypical infectious process. COVID test negative. Overnight, O2 sat decreased to the 80's and patient was placed on non-rebreather. EKG unchanged since previous. Home BP meds were resumed and BP currently improved to 152/77.   Reports his last dialysis was 12/01/18 and was completed without issue. He left treatment 0.2kg above his EDW of 94 kg. BP was elevated throughout treatment and EDW had been lowered about 1 week prior due to persistent hypertension. Weight 97.1kg on admission. At the time of exam, patient is seated on bedside and appears uncomfortable. O2 sat high 80's on nasal cannula. Nonrebreather was placed with O2 10L and sats improved to 93%. ROS is somewhat limited due to patient's condition. Patient continues to report shortness of breath, orthopnea and cough. Also reports back pain today. Denies headache, dizziness, CP, abdominal pain, N/V/D. He states that he does not believe his SOB is caused by excess fluid.   Past Medical History:  Diagnosis Date  . Acute edema of lung, unspecified   . Acute, but ill-defined, cerebrovascular disease   . Allergy   . Anemia   . 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)   . Anxiety   . Asthma   . Asthma    moderate persistent  . Carpal tunnel syndrome   . Cellulitis and abscess of trunk   . Cholelithiasis 07/13/2014  . Chronic  headaches   . Debility, unspecified   . Dermatophytosis of the body   . Dysrhythmia    history of  . Edema   . End stage renal disease on dialysis [facility])    "MWF; [facility]" (10/21/2014)  . Essential hypertension, benign   . GERD (gastroesophageal reflux disease)   . Gout, unspecified   . HTN (hypertension)   . Hypertrophy of prostate without urinary obstruction and other lower urinary tract symptoms (LUTS)   . Hypotension, unspecified   . Impotence of organic origin   . Insomnia, unspecified   . Kidney replaced by transplant   . Localization-related (focal) (partial) epilepsy and epileptic syndromes with complex partial seizures, without mention of intractable epilepsy   . Lumbago   . Memory loss   . OSA on CPAP   . Other and unspecified hyperlipidemia   . Other chronic nonalcoholic liver disease   . Other malaise and fatigue   . Other nonspecific abnormal serum enzyme levels   . Pain in joint, lower leg   . Pain in joint, upper arm   . Pneumonia "several times"  . Renal dialysis status(V45.11) 02/05/2010   restarted 01/02/13 ofter renal trransplant failure  . Secondary hyperparathyroidism (of renal origin)   . Shortness of breath   . Sleep apnea   . Tension headache   . Unspecified constipation   . Unspecified essential hypertension   . Unspecified hereditary and idiopathic peripheral neuropathy   . Unspecified vitamin D deficiency    Past  Surgical History:  Procedure Laterality Date  . AV FISTULA PLACEMENT Left ?2010   "forearm; at [facility]"  . BACK SURGERY    . CARDIAC CATHETERIZATION  03/21/2011  . CHOLECYSTECTOMY N/A 10/21/2014   Procedure: LAPAROSCOPIC CHOLECYSTECTOMY WITH INTRAOPERATIVE CHOLANGIOGRAM;  Surgeon: Autumn Messing III, MD;  Location: [facility];  Service: General;  Laterality: N/A;  . COLONOSCOPY    . 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 KIDNEY TRANSPLANT  08/17/2011   [place]  10/21/2014   w/IOC  . LEFT HEART CATHETERIZATION WITH CORONARY ANGIOGRAM N/A 03/21/2011   Procedure: LEFT HEART CATHETERIZATION WITH CORONARY ANGIOGRAM;  Surgeon: [name], MD;  Location: [facility] CATH LAB;  Service: Cardiovascular;  Laterality: N/A;  . NEPHRECTOMY  08/2013   removed transplaned kidney  . POSTERIOR FUSION CERVICAL SPINE  06/25/2012   for spinal stenosis  . VASECTOMY  2010   Family History  Adopted: Yes  Problem Relation Age of Onset  . Colon cancer Neg Hx   . Esophageal cancer Neg Hx   . Rectal cancer Neg Hx   . Stomach cancer Neg Hx    Social History:  reports that he has been smoking cigarettes. He has a 16.00 pack-year smoking history. He has never used smokeless tobacco. He reports that he does not drink alcohol or use drugs.  ROS: As per HPI otherwise negative.    Physical Exam: Vitals:   12/03/18 0930 12/03/18 1015 12/03/18 1029 12/03/18 1030  BP: (!) 151/82  (!) 147/76 (!) 152/77  Pulse: 78 81 80 81  Resp: (!) 34     SpO2: 95% 94%  91%  Weight:      Height:         General: Well developed, well nourished male, seating on bedside. Appears uncomfortable.  Head: Normocephalic, atraumatic, sclera non-icteric, mucus membranes are moist. Neck: + JVD Lungs: Rhonchi left lower lobe. Decreased breath sounds and occasional expiratory wheeze right lower lobe.  Heart: RRR with normal S1, S2. No murmurs, rubs, or gallops appreciated. Abdomen: Soft, non-tender, non-distended with normoactive bowel sounds. No rebound/guarding. No obvious abdominal masses. Musculoskeletal:  Strength and tone appear normal for age. Lower extremities: Trace edema bilateral lower extremities. Neuro: Alert. Moves all extremities spontaneously. Psych:  Responds to questions appropriately with a normal affect. Dialysis Access: LUE AVF + bruit  Allergies  Allergen Reactions  . Codeine Nausea And Vomiting   Prior to Admission medications   Medication Sig Start Date  End Date Taking? Authorizing Provider  acetaminophen (TYLENOL) 500 MG tablet Take 1 tablet (500 mg total) by mouth every 6 (six) hours as needed for moderate pain. 09/12/14  Yes Samuella Cota, MD  albuterol (PROVENTIL HFA;VENTOLIN HFA) 108 (90 Base) MCG/ACT inhaler Inhale 2 puffs into the lungs every 6 (six) hours as needed for wheezing or shortness of breath. 06/05/17  Yes Patrecia Pour, MD  amLODipine (NORVASC) 10 MG tablet Take 1 tablet (10 mg total) by mouth daily. Patient taking differently: Take 10 mg by mouth at bedtime.  10/31/18  Yes [name], MD  aspirin EC 81 MG tablet Take 81 mg by mouth daily.   Yes [provider]  b complex-vitamin c-folic acid (NEPHRO-VITE) 0.8 MG TABS tablet TAKE 1 TABLET BY MOUTH EVERY DAY Patient taking differently: Take 1 tablet by mouth daily.  05/29/18  Yes Reed, Tiffany L, DO  budesonide-formoterol (SYMBICORT) 160-4.5 MCG/ACT inhaler Inhale 2 puffs into the  lungs 2 (two) times daily. 10/09/18  Yes Tanda Rockers, MD  calcitRIOL (ROCALTROL) 0.5 MCG capsule Take 2 capsules (1 mcg total) by mouth every Monday, Wednesday, and Friday with hemodialysis. 06/04/18  Yes Black, Lezlie Octave, NP  Cholecalciferol (VITAMIN D3) 5000 UNITS TABS Take 5,000 Units by mouth daily.    Yes [provider]  cinacalcet (SENSIPAR) 30 MG tablet Take 90 mg by mouth daily with supper.  08/12/18  Yes [provider]  clonazePAM (KLONOPIN) 0.5 MG tablet Take 1 tablet (0.5 mg total) by mouth 2 (two) times daily as needed for anxiety. 09/02/18  Yes Reed, Tiffany L, DO  diphenhydrAMINE (BENADRYL) 25 MG tablet Take 50 mg by mouth at bedtime.    Yes [provider]  guaiFENesin (MUCINEX) 600 MG 12 hr tablet Take 1,200 mg by mouth 2 (two) times daily.   Yes [provider]  hydrALAZINE (APRESOLINE) 100 MG tablet Take 1 tablet (100 mg total) by mouth 3 (three) times daily. 10/21/18  Yes Hilty, Nadean Corwin, MD  isosorbide mononitrate (IMDUR) 30 MG 24 hr  tablet Take 1 tablet (30 mg total) by mouth daily. Patient taking differently: Take 30 mg by mouth at bedtime.  10/21/18 01/19/19 Yes Hilty, Nadean Corwin, MD  lidocaine-prilocaine (EMLA) cream Apply 1 application topically See admin instructions. Apply 1 to 2 hours prior to dialysis on Mondays, Wednesdays, and Fridays. Cover with saran wrap. 11/18/18  Yes [provider]  metoprolol tartrate (LOPRESSOR) 100 MG tablet Take 1 tablet (100 mg total) by mouth 2 (two) times daily. 08/28/18 08/28/19 Yes Reed, Tiffany L, DO  omeprazole (PRILOSEC) 20 MG capsule TAKE 1 CAPSULE TWICE DAILY BEFORE A MEAL Patient taking differently: Take 20 mg by mouth 2 (two) times daily before a meal.  03/18/18  Yes Milus Banister, MD  ondansetron (ZOFRAN ODT) 4 MG disintegrating tablet Take 1 tablet (4 mg total) by mouth every 8 (eight) hours as needed for nausea or vomiting. 10/26/16  Yes Carmon Sails J, PA-C  pravastatin (PRAVACHOL) 40 MG tablet TAKE 1 TABLET EVERY EVENING Patient taking differently: Take 40 mg by mouth at bedtime.  05/16/18  Yes Reed, Tiffany L, DO  PRESCRIPTION MEDICATION Inhale into the lungs at bedtime. CPAP   Yes [provider]  sucroferric oxyhydroxide (VELPHORO) 500 MG chewable tablet Chew 500-1,500 mg by mouth See admin instructions. Chew 3 tablets (1500 mg) by mouth up to three times daily with meals and 1 tablet (500 mg) with snacks   Yes [provider]  traMADol (ULTRAM) 50 MG tablet Take 1 tablet (50 mg total) by mouth daily as needed. Patient taking differently: Take 50 mg by mouth at bedtime as needed for moderate pain.  08/28/18  Yes Reed, Tiffany L, DO   Current Facility-Administered Medications  Medication Dose Route Frequency Provider Last Rate Last Dose  . acetaminophen (TYLENOL) tablet 500 mg  500 mg Oral Q6H PRN Elwyn Reach, MD   500 mg at 12/03/18 0824  . albuterol (VENTOLIN HFA) 108 (90 Base) MCG/ACT inhaler 2 puff  2 puff Inhalation Q6H PRN Gala Romney  L, MD      . amLODipine (NORVASC) tablet 10 mg  10 mg Oral QHS Gala Romney L, MD   10 mg at 12/03/18 0416  . aspirin EC tablet 81 mg  81 mg Oral Daily Gala Romney L, MD   81 mg at 12/03/18 1029  . calcitRIOL (ROCALTROL) capsule 1 mcg  1 mcg Oral Q M,W,F-HD Gala Romney  L, MD      . cefTRIAXone (ROCEPHIN) 1 g in sodium chloride 0.9 % 100 mL IVPB  1 g Intravenous Q24H Elwyn Reach, MD   Stopped at 12/03/18 0436  . Chlorhexidine Gluconate Cloth 2 % PADS 6 each  6 each Topical Q0600 Collins, Samantha G, PA-C      . cholecalciferol (VITAMIN D3) tablet 5,000 Units  5,000 Units Oral Daily Jonelle Sidle, Mohammad L, MD      . cinacalcet (SENSIPAR) tablet 90 mg  90 mg Oral Q supper Gala Romney L, MD      . clonazePAM (KLONOPIN) tablet 0.5 mg  0.5 mg Oral BID PRN Gala Romney L, MD      . diphenhydrAMINE (BENADRYL) capsule 50 mg  50 mg Oral QHS Elwyn Reach, MD   50 mg at 12/03/18 0216  . guaiFENesin (MUCINEX) 12 hr tablet 1,200 mg  1,200 mg Oral BID Gala Romney L, MD   1,200 mg at 12/03/18 0210  . hydrALAZINE (APRESOLINE) tablet 100 mg  100 mg Oral Q8H Garba, Mohammad L, MD   100 mg at 12/03/18 0445  . insulin aspart (novoLOG) injection 0-9 Units  0-9 Units Subcutaneous TID WC Garba, Mohammad L, MD      . isosorbide mononitrate (IMDUR) 24 hr tablet 30 mg  30 mg Oral QHS Gala Romney L, MD   30 mg at 12/03/18 0217  . ketoconazole (NIZORAL) 2 % cream   Topical Daily Evelina Bucy, DPM      . ketoconazole (NIZORAL) 2 % cream   Topical Daily Garba, Mohammad L, MD      . lidocaine-prilocaine (EMLA) cream 1 application  1 application Topical Q M,W,F-HD Jonelle Sidle, Mohammad L, MD      . metoprolol tartrate (LOPRESSOR) tablet 100 mg  100 mg Oral BID Gala Romney L, MD   100 mg at 12/03/18 1029  . mometasone-formoterol (DULERA) 200-5 MCG/ACT inhaler 2 puff  2 puff Inhalation BID Gala Romney L, MD      . multivitamin (RENA-VIT) tablet 1 tablet  1 tablet Oral QHS Elwyn Reach, MD    1 tablet at 12/03/18 0446  . nicotine (NICODERM CQ - dosed in mg/24 hours) patch 21 mg  21 mg Transdermal Daily Elwyn Reach, MD   21 mg at 12/03/18 1032  . ondansetron (ZOFRAN) tablet 4 mg  4 mg Oral Q6H PRN Elwyn Reach, MD       Or  . ondansetron (ZOFRAN) injection 4 mg  4 mg Intravenous Q6H PRN Jonelle Sidle, Mohammad L, MD      . ondansetron (ZOFRAN-ODT) disintegrating tablet 4 mg  4 mg Oral Q8H PRN Garba, Mohammad L, MD      . pantoprazole (PROTONIX) EC tablet 40 mg  40 mg Oral Daily Gala Romney L, MD   40 mg at 12/03/18 1030  . pravastatin (PRAVACHOL) tablet 40 mg  40 mg Oral QHS Elwyn Reach, MD   40 mg at 12/03/18 0218  . sucroferric oxyhydroxide (VELPHORO) chewable tablet 1,500 mg  1,500 mg Oral TID WC Garba, Mohammad L, MD      . sucroferric oxyhydroxide (VELPHORO) chewable tablet 500 mg  500 mg Oral PRN Elwyn Reach, MD      . traMADol (ULTRAM) tablet 50 mg  50 mg Oral QHS PRN Elwyn Reach, MD       Current Outpatient Medications  Medication Sig Dispense Refill  . acetaminophen (TYLENOL) 500 MG tablet Take 1 tablet (500 mg  total) by mouth every 6 (six) hours as needed for moderate pain.    Marland Kitchen albuterol (PROVENTIL HFA;VENTOLIN HFA) 108 (90 Base) MCG/ACT inhaler Inhale 2 puffs into the lungs every 6 (six) hours as needed for wheezing or shortness of breath. 1 Inhaler 0  . amLODipine (NORVASC) 10 MG tablet Take 1 tablet (10 mg total) by mouth daily. (Patient taking differently: Take 10 mg by mouth at bedtime. ) 30 tablet 11  . aspirin EC 81 MG tablet Take 81 mg by mouth daily.    Marland Kitchen b complex-vitamin c-folic acid (NEPHRO-VITE) 0.8 MG TABS tablet TAKE 1 TABLET BY MOUTH EVERY DAY (Patient taking differently: Take 1 tablet by mouth daily. ) 90 tablet 1  . budesonide-formoterol (SYMBICORT) 160-4.5 MCG/ACT inhaler Inhale 2 puffs into the lungs 2 (two) times daily. 1 Inhaler 0  . calcitRIOL (ROCALTROL) 0.5 MCG capsule Take 2 capsules (1 mcg total) by mouth every Monday,  Wednesday, and Friday with hemodialysis. 60 capsule 1  . Cholecalciferol (VITAMIN D3) 5000 UNITS TABS Take 5,000 Units by mouth daily.     . cinacalcet (SENSIPAR) 30 MG tablet Take 90 mg by mouth daily with supper.     . clonazePAM (KLONOPIN) 0.5 MG tablet Take 1 tablet (0.5 mg total) by mouth 2 (two) times daily as needed for anxiety. 60 tablet 0  . diphenhydrAMINE (BENADRYL) 25 MG tablet Take 50 mg by mouth at bedtime.     Marland Kitchen guaiFENesin (MUCINEX) 600 MG 12 hr tablet Take 1,200 mg by mouth 2 (two) times daily.    . hydrALAZINE (APRESOLINE) 100 MG tablet Take 1 tablet (100 mg total) by mouth 3 (three) times daily. 90 tablet 1  . isosorbide mononitrate (IMDUR) 30 MG 24 hr tablet Take 1 tablet (30 mg total) by mouth daily. (Patient taking differently: Take 30 mg by mouth at bedtime. ) 90 tablet 3  . lidocaine-prilocaine (EMLA) cream Apply 1 application topically See admin instructions. Apply 1 to 2 hours prior to dialysis on Mondays, Wednesdays, and Fridays. Cover with saran wrap.    . metoprolol tartrate (LOPRESSOR) 100 MG tablet Take 1 tablet (100 mg total) by mouth 2 (two) times daily. 180 tablet 1  . omeprazole (PRILOSEC) 20 MG capsule TAKE 1 CAPSULE TWICE DAILY BEFORE A MEAL (Patient taking differently: Take 20 mg by mouth 2 (two) times daily before a meal. ) 180 capsule 3  . ondansetron (ZOFRAN ODT) 4 MG disintegrating tablet Take 1 tablet (4 mg total) by mouth every 8 (eight) hours as needed for nausea or vomiting. 20 tablet 0  . pravastatin (PRAVACHOL) 40 MG tablet TAKE 1 TABLET EVERY EVENING (Patient taking differently: Take 40 mg by mouth at bedtime. ) 90 tablet 0  . PRESCRIPTION MEDICATION Inhale into the lungs at bedtime. CPAP    . sucroferric oxyhydroxide (VELPHORO) 500 MG chewable tablet Chew 500-1,500 mg by mouth See admin instructions. Chew 3 tablets (1500 mg) by mouth up to three times daily with meals and 1 tablet (500 mg) with snacks    . traMADol (ULTRAM) 50 MG tablet Take 1 tablet  (50 mg total) by mouth daily as needed. (Patient taking differently: Take 50 mg by mouth at bedtime as needed for moderate pain. ) 30 tablet 0   Labs: Basic Metabolic Panel: Recent Labs  Lab 12/02/18 2031 12/02/18 2052 12/03/18 0333  NA 140 140 139  K 4.4 4.1 3.9  CL 98  --  98  CO2 23  --  24  GLUCOSE 129*  --  181*  BUN 48*  --  53*  CREATININE 12.83*  --  13.84*  CALCIUM 9.0  --  8.6*   Liver Function Tests: Recent Labs  Lab 12/02/18 2031 12/03/18 0333  AST 20 16  ALT 16 14  ALKPHOS 91 81  BILITOT 0.9 0.5  PROT 7.6 6.8  ALBUMIN 4.0 3.4*   CBC: Recent Labs  Lab 12/02/18 2031 12/02/18 2052 12/03/18 0333  WBC 6.9  --  6.0  NEUTROABS 5.5  --   --   HGB 13.8 13.9 11.9*  HCT 44.1 41.0 37.8*  MCV 95.5  --  95.7  PLT 98*  --  89*   CBG: Recent Labs  Lab 12/03/18 0822  GLUCAP 97   Studies/Results: Dg Chest Port 1 View  Result Date: 12/02/2018 CLINICAL DATA:  Dyspnea. EXAM: PORTABLE CHEST 1 VIEW COMPARISON:  09/17/2018 FINDINGS: The heart size remains enlarged. There are prominent interstitial lung markings bilaterally which appear similar to prior study. There is no pneumothorax. No large pleural effusion. No acute osseous abnormality. IMPRESSION: Cardiomegaly with prominent interstitial lung markings. This is similar to x-ray dated 09/17/2018. Differential considerations include congestive heart failure versus an atypical infectious process such as viral pneumonia. Electronically Signed   By: Constance Holster M.D.   On: 12/02/2018 20:51    Dialysis Orders: Center: [facility]  on MWF  Hectorol 7 mcg IV q HD Heparin 4000 unit bolus Renvela 800 mg 3 tabs PO TID Sensipar 90 mgdaily  Assessment/Plan: 1.  Shortness of breath: COVID negative, afebrile. Exam and chest x-ray consistent with pulmonary edema. Hypertensive urgency likely exacerbating symptoms. O2 sat improved on non-rebreather. Will plan for urgent HD today, next patient. UFG 4L. May need  lower EDW.  2.  ESRD:  MWF schedule, has been compliant with HD with sometimes shortens treatments. Urgent HD today as above. Will follow to determine if serial HD is needed tomorrow. K+ 3.9.  3.  Hypertension: BP improved with home meds, historically poorly controlled. Expect further improvement with dialysis.  4.  Anemia: Hemoglobin 11.9. No ESA indicated.  5.  Metabolic bone disease: Corrected calcium 9.1.Historically poor control of phosphorus and PTH, possible med compliance issue. Will resume binder, hectorol and sensipar.   6.  Nutrition:  Renal diet with fluid restrictions. 7. T2DM: on insulin, per primary  Anice Paganini, PA-C 12/03/2018, 11:01 AM  [facility] Pager: [phone]  Pt seen, examined and agree w A/P as above.  Kelly Splinter  MD 12/03/2018, 12:54 PM

## 2018-12-03 NOTE — ED Notes (Signed)
Pt coughing and then having a bowel movement repeatedly, pt d-satting into the 80's placed on a non-rebreather. Respiratory called for c-pap machine.

## 2018-12-03 NOTE — ED Notes (Signed)
ED TO INPATIENT HANDOFF REPORT  ED Nurse Name and Phone #: [phone]  S Name/Age/Gender Kirk [name] 58 y.o. male Room/Bed: 016C/016C  Code Status   Code Status: Full Code  Home/SNF/Other Home Patient oriented to: self Is this baseline? Yes   Triage Complete: Triage complete  Chief Complaint Onychomycosis [B35.1] Dyspnea, unspecified type [R06.00]  Triage Note Pt began having shob and sweats today. MWF dialysis. Had full tx yesterday.  Notes hematuria x 2 days but "hardly makes urine" No known exposures to covid. Tested today.    Allergies Allergies  Allergen Reactions  . Codeine Nausea And Vomiting    Level of Care/Admitting Diagnosis ED Disposition    ED Disposition Condition [facility] Area: [place] [[id number]]  Level of Care: Telemetry Cardiac [103]  Covid Evaluation: Asymptomatic Screening Protocol (No Symptoms)  Diagnosis: Dyspnea and respiratory abnormalities [222979]  Admitting Physician: Elwyn Reach [[id number]]  Attending Physician: Elwyn Reach [[id number]]  Estimated length of stay: past midnight tomorrow  Certification:: I certify this patient will need inpatient services for at least 2 midnights  PT Class (Do Not Modify): Inpatient [101]  PT Acc Code (Do Not Modify): Private [1]       B Medical/Surgery History Past Medical History:  Diagnosis Date  . Acute edema of lung, unspecified   . Acute, but ill-defined, cerebrovascular disease   . Allergy   . Anemia   . 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)   . Anxiety   . Asthma   . Asthma    moderate persistent  . Carpal tunnel syndrome   . Cellulitis and abscess of trunk   . Cholelithiasis 07/13/2014  . Chronic headaches   . Debility, unspecified   . Dermatophytosis of the body   . Dysrhythmia    history of  . Edema   . End stage renal disease on dialysis [facility])    "MWF; [facility]" (10/21/2014)  . Essential hypertension, benign   .  GERD (gastroesophageal reflux disease)   . Gout, unspecified   . HTN (hypertension)   . Hypertrophy of prostate without urinary obstruction and other lower urinary tract symptoms (LUTS)   . Hypotension, unspecified   . Impotence of organic origin   . Insomnia, unspecified   . Kidney replaced by transplant   . Localization-related (focal) (partial) epilepsy and epileptic syndromes with complex partial seizures, without mention of intractable epilepsy   . Lumbago   . Memory loss   . OSA on CPAP   . Other and unspecified hyperlipidemia   . Other chronic nonalcoholic liver disease   . Other malaise and fatigue   . Other nonspecific abnormal serum enzyme levels   . Pain in joint, lower leg   . Pain in joint, upper arm   . Pneumonia "several times"  . Renal dialysis status(V45.11) 02/05/2010   restarted 01/02/13 ofter renal trransplant failure  . Secondary hyperparathyroidism (of renal origin)   . Shortness of breath   . Sleep apnea   . Tension headache   . Unspecified constipation   . Unspecified essential hypertension   . Unspecified hereditary and idiopathic peripheral neuropathy   . Unspecified vitamin D deficiency    Past Surgical History:  Procedure Laterality Date  . AV FISTULA PLACEMENT Left ?2010   "forearm; at [facility]"  . BACK SURGERY    . CARDIAC CATHETERIZATION  03/21/2011  . CHOLECYSTECTOMY N/A 10/21/2014   Procedure: LAPAROSCOPIC CHOLECYSTECTOMY WITH INTRAOPERATIVE CHOLANGIOGRAM;  Surgeon: Autumn Messing III, MD;  Location: [place];  Service: General;  Laterality: N/A;  . COLONOSCOPY    . 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 KIDNEY TRANSPLANT  08/17/2011   [place]  10/21/2014   w/IOC  . LEFT HEART CATHETERIZATION WITH CORONARY ANGIOGRAM N/A 03/21/2011   Procedure: LEFT HEART CATHETERIZATION WITH CORONARY ANGIOGRAM;  Surgeon: [name], MD;  Location: [facility] CATH LAB;  Service: Cardiovascular;   Laterality: N/A;  . NEPHRECTOMY  08/2013   removed transplaned kidney  . POSTERIOR FUSION CERVICAL SPINE  06/25/2012   for spinal stenosis  . VASECTOMY  2010     A IV Location/Drains/Wounds Patient Lines/Drains/Airways Status   Active Line/Drains/Airways    Name:   Placement date:   Placement time:   Site:   Days:   Peripheral IV 12/02/18 Right Hand   12/02/18    2033    Hand   1   Peripheral IV 12/02/18 Right Antecubital   12/02/18    2034    Antecubital   1   Fistula / Graft Left Forearm   -    -    Forearm             Intake/Output Last 24 hours  Intake/Output Summary (Last 24 hours) at 12/03/2018 1713 Last data filed at 12/03/2018 1522 Gross per 24 hour  Intake -  Output 3300 ml  Net -3300 ml    Labs/Imaging Results for orders placed or performed during the hospital encounter of 12/02/18 (from the past 48 hour(s))  Brain natriuretic peptide     Status: Abnormal   Collection Time: 12/02/18  8:31 PM  Result Value Ref Range   B Natriuretic Peptide 1,139.8 (H) 0.0 - 100.0 pg/mL    Comment: Performed at [facility], [address], [place], [place] [address]  Ethanol     Status: None   Collection Time: 12/02/18  8:31 PM  Result Value Ref Range   Alcohol, Ethyl (B) <10 <10 mg/dL    Comment: (NOTE) Lowest detectable limit for serum alcohol is 10 mg/dL. For medical purposes only. Performed at [facility], [place] [address] [place], [place] [address]   Comprehensive metabolic panel     Status: Abnormal   Collection Time: 12/02/18  8:31 PM  Result Value Ref Range   Sodium 140 135 - 145 mmol/L   Potassium 4.4 3.5 - 5.1 mmol/L   Chloride 98 98 - 111 mmol/L   CO2 23 22 - 32 mmol/L   Glucose, Bld 129 (H) 70 - 99 mg/dL   BUN 48 (H) 6 - 20 mg/dL   Creatinine, Ser 12.83 (H) 0.61 - 1.24 mg/dL   Calcium 9.0 8.9 - 10.3 mg/dL   Total Protein 7.6 6.5 - 8.1 g/dL   Albumin 4.0 3.5 - 5.0 g/dL   AST 20 15 - 41 U/L   ALT 16 0 - 44 U/L   Alkaline Phosphatase 91 38 -  126 U/L   Total Bilirubin 0.9 0.3 - 1.2 mg/dL   GFR calc non Af Amer 4 (L) >60 mL/min   GFR calc Af Amer 4 (L) >60 mL/min   Anion gap 19 (H) 5 - 15    Comment: Performed at [facility], [place] [address], [place],  [address]  CBC with Differential     Status: Abnormal   Collection Time: 12/02/18  8:31 PM  Result Value Ref  Range   WBC 6.9 4.0 - 10.5 K/uL   RBC 4.62 4.22 - 5.81 MIL/uL   Hemoglobin 13.8 13.0 - 17.0 g/dL   HCT 44.1 39.0 - 52.0 %   MCV 95.5 80.0 - 100.0 fL   MCH 29.9 26.0 - 34.0 pg   MCHC 31.3 30.0 - 36.0 g/dL   RDW 15.1 11.5 - 15.5 %   Platelets 98 (L) 150 - 400 K/uL    Comment: Immature Platelet Fraction may be clinically indicated, consider ordering this additional test [id number]    nRBC 0.0 0.0 - 0.2 %   Neutrophils Relative % 80 %   Neutro Abs 5.5 1.7 - 7.7 K/uL   Lymphocytes Relative 9 %   Lymphs Abs 0.6 (L) 0.7 - 4.0 K/uL   Monocytes Relative 9 %   Monocytes Absolute 0.6 0.1 - 1.0 K/uL   Eosinophils Relative 1 %   Eosinophils Absolute 0.0 0.0 - 0.5 K/uL   Basophils Relative 1 %   Basophils Absolute 0.1 0.0 - 0.1 K/uL   Immature Granulocytes 0 %   Abs Immature Granulocytes 0.02 0.00 - 0.07 K/uL    Comment: Performed at [facility], [address] [place], [place] [address]  Protime-INR     Status: None   Collection Time: 12/02/18  8:31 PM  Result Value Ref Range   Prothrombin Time QUANTITY NOT SUFFICIENT, UNABLE TO PERFORM TEST 11.4 - 15.2 seconds    Comment: CALLED TO [name],RN 2058 12/02/2018 WBOND   INR QUANTITY NOT SUFFICIENT, UNABLE TO PERFORM TEST 0.8 - 1.2    Comment: Performed at [facility], [place] [address] [place], [place] [address]  SARS CORONAVIRUS 2 (TAT 6-24 HRS) Nasopharyngeal Nasopharyngeal Swab     Status: None   Collection Time: 12/02/18  8:35 PM   Specimen: Nasopharyngeal Swab  Result Value Ref Range   SARS Coronavirus 2 NEGATIVE NEGATIVE    Comment: (NOTE) SARS-CoV-2 target nucleic acids are NOT  DETECTED. The SARS-CoV-2 RNA is generally detectable in upper and lower respiratory specimens during the acute phase of infection. Negative results do not preclude SARS-CoV-2 infection, do not rule out co-infections with other pathogens, and should not be used as the sole basis for treatment or other patient management decisions. Negative results must be combined with clinical observations, patient history, and epidemiological information. The expected result is Negative. Fact Sheet for Patients: SugarRoll.be Fact Sheet for Healthcare Providers: https://www.woods-mathews.com/ This test is not yet approved or cleared by the [place] FDA and  has been authorized for detection and/or diagnosis of SARS-CoV-2 by FDA under an Emergency Use Authorization (EUA). This EUA will remain  in effect (meaning this test can be used) for the duration of the COVID-19 declaration under Section 56 4(b)(1) of the Act, 21 U.S.C. section 360bbb-3(b)(1), unless the authorization is terminated or revoked sooner. Performed at [facility], [place] [address], [place], [place] [address]   I-STAT 7, (LYTES, BLD GAS, ICA, H+H)     Status: Abnormal   Collection Time: 12/02/18  8:52 PM  Result Value Ref Range   pH, Arterial 7.386 7.350 - 7.450   pCO2 arterial 47.2 32.0 - 48.0 mmHg   pO2, Arterial 145.0 (H) 83.0 - 108.0 mmHg   Bicarbonate 28.3 (H) 20.0 - 28.0 mmol/L   TCO2 30 22 - 32 mmol/L   O2 Saturation 99.0 %   Acid-Base Excess 2.0 0.0 - 2.0 mmol/L   Sodium 140 135 - 145 mmol/L   Potassium  4.1 3.5 - 5.1 mmol/L   Calcium, Ion 1.08 (L) 1.15 - 1.40 mmol/L   HCT 41.0 39.0 - 52.0 %   Hemoglobin 13.9 13.0 - 17.0 g/dL   Patient temperature HIDE    Collection site RADIAL, ALLEN'S TEST ACCEPTABLE    Drawn by RT    Sample type ARTERIAL   Troponin I (High Sensitivity)     Status: Abnormal   Collection Time: 12/02/18 10:32 PM  Result Value Ref Range   Troponin I  (High Sensitivity) 27 (H) <18 ng/L    Comment: (NOTE) Elevated high sensitivity troponin I (hsTnI) values and significant  changes across serial measurements may suggest ACS but many other  chronic and acute conditions are known to elevate hsTnI results.  Refer to the "Links" section for chest pain algorithms and additional  guidance. Performed at [facility], [place] [address], [place], [place] [address]   Troponin I (High Sensitivity)     Status: Abnormal   Collection Time: 12/03/18  3:33 AM  Result Value Ref Range   Troponin I (High Sensitivity) 31 (H) <18 ng/L    Comment: (NOTE) Elevated high sensitivity troponin I (hsTnI) values and significant  changes across serial measurements may suggest ACS but many other  chronic and acute conditions are known to elevate hsTnI results.  Refer to the "Links" section for chest pain algorithms and additional  guidance. Performed at [facility], [place] [address] [place], [place] [address]   Comprehensive metabolic panel     Status: Abnormal   Collection Time: 12/03/18  3:33 AM  Result Value Ref Range   Sodium 139 135 - 145 mmol/L   Potassium 3.9 3.5 - 5.1 mmol/L   Chloride 98 98 - 111 mmol/L   CO2 24 22 - 32 mmol/L   Glucose, Bld 181 (H) 70 - 99 mg/dL   BUN 53 (H) 6 - 20 mg/dL   Creatinine, Ser 13.84 (H) 0.61 - 1.24 mg/dL   Calcium 8.6 (L) 8.9 - 10.3 mg/dL   Total Protein 6.8 6.5 - 8.1 g/dL   Albumin 3.4 (L) 3.5 - 5.0 g/dL   AST 16 15 - 41 U/L   ALT 14 0 - 44 U/L   Alkaline Phosphatase 81 38 - 126 U/L   Total Bilirubin 0.5 0.3 - 1.2 mg/dL   GFR calc non Af Amer 3 (L) >60 mL/min   GFR calc Af Amer 4 (L) >60 mL/min   Anion gap 17 (H) 5 - 15    Comment: Performed at [facility], [place] [address] [place], [place] [address]  CBC     Status: Abnormal   Collection Time: 12/03/18  3:33 AM  Result Value Ref Range   WBC 6.0 4.0 - 10.5 K/uL   RBC 3.95 (L) 4.22 - 5.81 MIL/uL   Hemoglobin 11.9 (L) 13.0 - 17.0 g/dL   HCT  37.8 (L) 39.0 - 52.0 %   MCV 95.7 80.0 - 100.0 fL   MCH 30.1 26.0 - 34.0 pg   MCHC 31.5 30.0 - 36.0 g/dL   RDW 15.0 11.5 - 15.5 %   Platelets 89 (L) 150 - 400 K/uL    Comment: Immature Platelet Fraction may be clinically indicated, consider ordering this additional test [id number] CONSISTENT WITH PREVIOUS RESULT    nRBC 0.0 0.0 - 0.2 %    Comment: Performed at [facility], [place] [address], [place], [place] [address]  CBG monitoring, ED     Status:  None   Collection Time: 12/03/18  8:22 AM  Result Value Ref Range   Glucose-Capillary 97 70 - 99 mg/dL   Dg Chest Port 1 View  Result Date: 12/02/2018 CLINICAL DATA:  Dyspnea. EXAM: PORTABLE CHEST 1 VIEW COMPARISON:  09/17/2018 FINDINGS: The heart size remains enlarged. There are prominent interstitial lung markings bilaterally which appear similar to prior study. There is no pneumothorax. No large pleural effusion. No acute osseous abnormality. IMPRESSION: Cardiomegaly with prominent interstitial lung markings. This is similar to x-ray dated 09/17/2018. Differential considerations include congestive heart failure versus an atypical infectious process such as viral pneumonia. Electronically Signed   By: Constance Holster M.D.   On: 12/02/2018 20:51    Pending Labs Unresulted Labs (From admission, onward)    Start     Ordered   12/03/18 1456  Culture, Urine  Once,   STAT     12/03/18 1455   12/03/18 1455  Urinalysis, Routine w reflex microscopic  Once,   STAT     12/03/18 1455   12/03/18 1125  CBC  Once,   STAT     12/03/18 1124   12/03/18 1125  Renal function panel  Once,   STAT     12/03/18 1124          Vitals/Pain Today's Vitals   12/03/18 1630 12/03/18 1651 12/03/18 1654 12/03/18 1713  BP: (!) 189/83     Pulse: 88 87 87 79  Resp: (!) 34  (!) 32 (!) 28  Temp:      TempSrc:      SpO2: 95% (!) 78% (!) 83% 95%  Weight:      Height:      PainSc:        Isolation Precautions No active  isolations  Medications Medications  aspirin EC tablet 81 mg (81 mg Oral Given 12/03/18 1029)  diphenhydrAMINE (BENADRYL) capsule 50 mg (50 mg Oral Given 12/03/18 0216)  acetaminophen (TYLENOL) tablet 500 mg (500 mg Oral Given 12/03/18 0824)  cholecalciferol (VITAMIN D3) tablet 5,000 Units (has no administration in time range)  ondansetron (ZOFRAN-ODT) disintegrating tablet 4 mg (has no administration in time range)  pantoprazole (PROTONIX) EC tablet 40 mg (40 mg Oral Given 12/03/18 1030)  pravastatin (PRAVACHOL) tablet 40 mg (40 mg Oral Given 12/03/18 0218)  multivitamin (RENA-VIT) tablet 1 tablet (1 tablet Oral Given 12/03/18 0446)  guaiFENesin (MUCINEX) 12 hr tablet 1,200 mg (1,200 mg Oral Given 12/03/18 0210)  cinacalcet (SENSIPAR) tablet 90 mg (has no administration in time range)  metoprolol tartrate (LOPRESSOR) tablet 100 mg (100 mg Oral Given 12/03/18 1029)  traMADol (ULTRAM) tablet 50 mg (has no administration in time range)  clonazePAM (KLONOPIN) tablet 0.5 mg (has no administration in time range)  ketoconazole (NIZORAL) 2 % cream ( Topical Given 12/03/18 1031)  mometasone-formoterol (DULERA) 200-5 MCG/ACT inhaler 2 puff (2 puffs Inhalation Not Given 12/03/18 0755)  hydrALAZINE (APRESOLINE) tablet 100 mg (100 mg Oral Not Given 12/03/18 0834)  isosorbide mononitrate (IMDUR) 24 hr tablet 30 mg (30 mg Oral Given 12/03/18 0217)  amLODipine (NORVASC) tablet 10 mg (10 mg Oral Given 12/03/18 0416)  lidocaine-prilocaine (EMLA) cream 1 application (has no administration in time range)  insulin aspart (novoLOG) injection 0-9 Units (0 Units Subcutaneous Not Given 12/03/18 0825)  ondansetron (ZOFRAN) tablet 4 mg (has no administration in time range)    Or  ondansetron (ZOFRAN) injection 4 mg (has no administration in time range)  cefTRIAXone (ROCEPHIN) 1 g in sodium chloride 0.9 % 100  mL IVPB (0 g Intravenous Stopped 12/03/18 0436)  nicotine (NICODERM CQ - dosed in mg/24 hours) patch 21 mg  (21 mg Transdermal Patch Applied 12/03/18 1032)  Chlorhexidine Gluconate Cloth 2 % PADS 6 each (has no administration in time range)  0.9 %  sodium chloride infusion (has no administration in time range)  0.9 %  sodium chloride infusion (has no administration in time range)  alteplase (CATHFLO ACTIVASE) injection 2 mg (has no administration in time range)  heparin injection 1,000 Units (has no administration in time range)  lidocaine (PF) (XYLOCAINE) 1 % injection 5 mL (has no administration in time range)  lidocaine-prilocaine (EMLA) cream 1 application (has no administration in time range)  pentafluoroprop-tetrafluoroeth (GEBAUERS) aerosol 1 application (has no administration in time range)  sevelamer carbonate (RENVELA) tablet 2,400 mg (has no administration in time range)  doxercalciferol (HECTOROL) injection 7 mcg (has no administration in time range)  enalaprilat (VASOTEC) injection 1.25 mg (has no administration in time range)  nitroGLYCERIN 50 mg in dextrose 5 % 250 mL (0.2 mg/mL) infusion (has no administration in time range)  albuterol (PROVENTIL) (2.5 MG/3ML) 0.083% nebulizer solution 2.5 mg (has no administration in time range)  LORazepam (ATIVAN) injection 0.5 mg (0.5 mg Intravenous Given 12/02/18 2037)  nitroGLYCERIN (NITROGLYN) 2 % ointment 1 inch (1 inch Topical Given 12/02/18 2146)  acetaminophen (TYLENOL) tablet 1,000 mg (1,000 mg Oral Given 12/02/18 2146)  LORazepam (ATIVAN) injection 0.5 mg (0.5 mg Intravenous Given 12/02/18 2211)  azithromycin (ZITHROMAX) tablet 500 mg (500 mg Oral Given 12/03/18 0216)  heparin injection 4,000 Units (4,000 Units Dialysis Given 12/03/18 1206)    Mobility walks Low fall risk   Focused Assessments Renal Assessment Handoff:  Hemodialysis Schedule: Hemodialysis Schedule: Monday/Wednesday/Friday Last Hemodialysis date and time: 12/03/2018   Restricted appendage: left arm     R Recommendations: See Admitting Provider Note  Report  given to:   Additional Notes: New orders placed, pt using home CPAP states he couldn't breath and needed that medication they given to help his lungs. EDP at bedside placed new orders.

## 2018-12-03 NOTE — ED Notes (Signed)
Pt was beginning to de-sat into the 80, pt placed on 6L.

## 2018-12-03 NOTE — ED Notes (Signed)
Breakfast tray ordered 

## 2018-12-03 NOTE — ED Notes (Signed)
Patient yelling out "I can't breath, I have told them this and I feel like I am not getting any air" Pt is sitting on the side of the bed with obvious dyspnea with mask not properly secured to his face. He has just returned from Dialysis and reports being on bipap up there then being switched down here.   The bed was soiled with urine and stool. I assisted him to stand while monitoring his oxygen saturation. No distress while standing. The bed and gown was changed, after which the EDP was alerted to come to bedside. Sats from 78%-84% on Bipap.

## 2018-12-03 NOTE — Progress Notes (Signed)
RT note- Called from dialysis that patient needed his cpap/bipap, on NRB, [id number], placed on Bipap at this time for work of breathing, continue to monitor.

## 2018-12-03 NOTE — ED Notes (Signed)
Report given Legrand Rams, RN at dialysis.

## 2018-12-03 NOTE — ED Notes (Signed)
Pt is NSR on monitor 

## 2018-12-03 NOTE — ED Notes (Signed)
ED Provider at bedside. 

## 2018-12-03 NOTE — ED Notes (Signed)
bs 97

## 2018-12-03 NOTE — Procedures (Signed)
   I was present at this dialysis session, have reviewed the session itself and made  appropriate changes Kelly Splinter MD [place] pager [phone]   12/03/2018, 12:53 PM

## 2018-12-04 ENCOUNTER — Inpatient Hospital Stay (HOSPITAL_COMMUNITY): Payer: Medicare Other

## 2018-12-04 DIAGNOSIS — I160 Hypertensive urgency: Secondary | ICD-10-CM

## 2018-12-04 DIAGNOSIS — G4733 Obstructive sleep apnea (adult) (pediatric): Secondary | ICD-10-CM

## 2018-12-04 DIAGNOSIS — Z9989 Dependence on other enabling machines and devices: Secondary | ICD-10-CM

## 2018-12-04 DIAGNOSIS — J9601 Acute respiratory failure with hypoxia: Principal | ICD-10-CM

## 2018-12-04 LAB — HEPATITIS B SURFACE ANTIGEN: Hepatitis B Surface Ag: NONREACTIVE

## 2018-12-04 LAB — MRSA PCR SCREENING: MRSA by PCR: NEGATIVE

## 2018-12-04 LAB — GLUCOSE, CAPILLARY
Glucose-Capillary: 142 mg/dL — ABNORMAL HIGH (ref 70–99)
Glucose-Capillary: 102 mg/dL — ABNORMAL HIGH (ref 70–99)
Glucose-Capillary: 146 mg/dL — ABNORMAL HIGH (ref 70–99)
Glucose-Capillary: 125 mg/dL — ABNORMAL HIGH (ref 70–99)
Glucose-Capillary: 135 mg/dL — ABNORMAL HIGH (ref 70–99)

## 2018-12-04 MED ORDER — ALBUTEROL SULFATE (2.5 MG/3ML) 0.083% IN NEBU
2.5000 mg | INHALATION_SOLUTION | Freq: Once | RESPIRATORY_TRACT | Status: AC
  Administered 2018-12-04: 2.5 mg via RESPIRATORY_TRACT
  Filled 2018-12-04: qty 3

## 2018-12-04 MED ORDER — CHLORHEXIDINE GLUCONATE CLOTH 2 % EX PADS
6.0000 | MEDICATED_PAD | Freq: Every day | CUTANEOUS | Status: DC
  Administered 2018-12-04: 6 via TOPICAL

## 2018-12-04 MED ORDER — ZOLPIDEM TARTRATE 5 MG PO TABS
5.0000 mg | ORAL_TABLET | Freq: Once | ORAL | Status: AC
  Administered 2018-12-04: 5 mg via ORAL
  Filled 2018-12-04: qty 1

## 2018-12-04 MED ORDER — PREDNISONE 20 MG PO TABS
40.0000 mg | ORAL_TABLET | Freq: Every day | ORAL | Status: DC
  Administered 2018-12-05: 40 mg via ORAL
  Filled 2018-12-04: qty 2

## 2018-12-04 MED ORDER — HEPARIN SODIUM (PORCINE) 1000 UNIT/ML IJ SOLN
INTRAMUSCULAR | Status: AC
  Administered 2018-12-04: 1000 [IU] via INTRAVENOUS_CENTRAL
  Filled 2018-12-04: qty 4

## 2018-12-04 MED ORDER — HEPARIN SODIUM (PORCINE) 1000 UNIT/ML DIALYSIS
4000.0000 [IU] | Freq: Once | INTRAMUSCULAR | Status: AC
  Administered 2018-12-04: 14:00:00 1000 [IU] via INTRAVENOUS_CENTRAL

## 2018-12-04 NOTE — Progress Notes (Signed)
Pt refused IV assessment/attempt. Primary nurse made aware.

## 2018-12-04 NOTE — Progress Notes (Signed)
PROGRESS NOTE    Kirk [name]  LOV:[id number] DOB: [birth year] DOA: 12/02/2018 PCP: Gayland Curry, DO   Brief Narrative: Kirk [name] is a 58 y.o. male with medical history significant of systolic dysfunction CHF with EF 45% from August of this year, end-stage renal disease on hemodialysis Mondays Wednesdays and Fridays, history of failed kidney transplant, diabetes, tobacco abuse, obstructive sleep apnea, uncontrolled hypertension, asthma with COPD. Patient presented secondary to dyspnea and found to have fluid overload in setting of ESRD.   Assessment & Plan:   Principal Problem:   Dyspnea and respiratory abnormalities Active Problems:   OSA on CPAP   History of kidney transplant   Type 2 diabetes mellitus with diabetic nephropathy, without long-term current use of insulin (HCC)   ESRD on dialysis [facility])   Cigarette smoker   Fluid overload   Hypertensive urgency   Chronic systolic heart failure (HCC)   Dyspnea Secondary to fluid overload. Concern for possible acute heart failure but possibly related to ESRD and hypertension exacerbating fluid imbalance. Required BiPAP on admission. He initially did well but this morning, some mild respiratory distress on room air. Patient with significant wheezing on exam. -HD per nephrology -Albuterol nebulizer treatments -Oxygen therapy -Chest x-ray  Acute respiratory failure with hypoxia Required BiPap. -Wean to room air -Continuous pulse oximetry and oxygen therapy; wean to room air as able  Hypertensive urgency In setting of fluid overload. Patient is on Imdur, hydralazine and amlodipine. Likely worse secondary to fluid overload. -Continue Imdur, hydralazine, amlodipine  Chronic systolic heart failure Recent EF of 40-45% on Transthoracic Echocardiogram from 8/25. -management with HD per nephrology  ESRD HD MWF. Nephrology consulted. -HD per nephrology  Cough Concern for pneumonia on admission. Started on ceftriaxone.  Possibly related to fluid overload -Continue ceftriaxone in setting of below  Dysuria/hematuria Patient already started on ceftriaxone on admission for above. Low risk for STDs per patient history. -Urinalysis, Urine culture -Continue ceftriaxone  OSA -CPAP   DVT prophylaxis: SCDs Code Status:   Code Status: Full Code Family Communication: None Disposition Plan: Discharge pending wean to room air and respiratory distress improved.   Consultants:   Nephrology  Procedures:   HD  Antimicrobials:  Ceftriaxone    Subjective: Dyspnea this morning. HD did not help with symptoms. He has a productive cough with yellow phlegm.  Objective: Vitals:   12/03/18 2111 12/03/18 2125 12/04/18 0548 12/04/18 0936  BP: (!) 149/74 (!) 155/72 (!) 151/69   Pulse: 84 88 74   Resp: 16  16   Temp: 98.5 F (36.9 C) 99.7 F (37.6 C) 98.6 F (37 C)   TempSrc: Oral Oral Oral   SpO2: 93% 95% 93% 91%  Weight:      Height:        Intake/Output Summary (Last 24 hours) at 12/04/2018 0950 Last data filed at 12/04/2018 0050 Gross per 24 hour  Intake -  Output 3302 ml  Net -3302 ml   Filed Weights   12/02/18 2030 12/03/18 1135 12/03/18 1522  Weight: 97.1 kg 98 kg 95.7 kg    Examination:  General exam: Appears calm and comfortable Respiratory system: Significant bilateral wheezing with diminished air movement. Respiratory effort increased with increased retractions noted. Cardiovascular system: S1 & S2 heard, RRR. No murmurs, rubs, gallops or clicks. Gastrointestinal system: Abdomen is nondistended, soft and nontender. No organomegaly or masses felt. Normal bowel sounds heard. Central nervous system: Alert and oriented. No focal neurological deficits. Extremities: No edema. No calf tenderness  Skin: No cyanosis. No rashes Psychiatry: Judgement and insight appear normal. Anxious      Data Reviewed: I have personally reviewed following labs and imaging studies  CBC: Recent Labs   Lab 12/02/18 2031 12/02/18 2052 12/03/18 0333 12/03/18 1910  WBC 6.9  --  6.0 6.3  NEUTROABS 5.5  --   --   --   HGB 13.8 13.9 11.9* 13.6  HCT 44.1 41.0 37.8* 43.4  MCV 95.5  --  95.7 94.3  PLT 98*  --  89* 86*   Basic Metabolic Panel: Recent Labs  Lab 12/02/18 2031 12/02/18 2052 12/03/18 0333 12/03/18 1910  NA 140 140 139 136  K 4.4 4.1 3.9 4.1  CL 98  --  98 95*  CO2 23  --  24 23  GLUCOSE 129*  --  181* 112*  BUN 48*  --  53* 29*  CREATININE 12.83*  --  13.84* 9.13*  CALCIUM 9.0  --  8.6* 9.2  PHOS  --   --   --  5.4*   GFR: Estimated Creatinine Clearance: 10 mL/min (A) (by C-G formula based on SCr of 9.13 mg/dL (H)). Liver Function Tests: Recent Labs  Lab 12/02/18 2031 12/03/18 0333 12/03/18 1910  AST 20 16  --   ALT 16 14  --   ALKPHOS 91 81  --   BILITOT 0.9 0.5  --   PROT 7.6 6.8  --   ALBUMIN 4.0 3.4* 4.0   No results for input(s): LIPASE, AMYLASE in the last 168 hours. No results for input(s): AMMONIA in the last 168 hours. Coagulation Profile: Recent Labs  Lab 12/02/18 2031  INR QUANTITY NOT SUFFICIENT, UNABLE TO PERFORM TEST   Cardiac Enzymes: No results for input(s): CKTOTAL, CKMB, CKMBINDEX, TROPONINI in the last 168 hours. BNP (last 3 results) No results for input(s): PROBNP in the last 8760 hours. HbA1C: No results for input(s): HGBA1C in the last 72 hours. CBG: Recent Labs  Lab 12/03/18 0822 12/04/18 0102 12/04/18 0752  GLUCAP 97 142* 102*   Lipid Profile: No results for input(s): CHOL, HDL, LDLCALC, TRIG, CHOLHDL, LDLDIRECT in the last 72 hours. Thyroid Function Tests: No results for input(s): TSH, T4TOTAL, FREET4, T3FREE, THYROIDAB in the last 72 hours. Anemia Panel: No results for input(s): VITAMINB12, FOLATE, FERRITIN, TIBC, IRON, RETICCTPCT in the last 72 hours. Sepsis Labs: No results for input(s): PROCALCITON, LATICACIDVEN in the last 168 hours.  Recent Results (from the past 240 hour(s))  SARS CORONAVIRUS 2 (TAT 6-24  HRS) Nasopharyngeal Nasopharyngeal Swab     Status: None   Collection Time: 12/02/18  8:35 PM   Specimen: Nasopharyngeal Swab  Result Value Ref Range Status   SARS Coronavirus 2 NEGATIVE NEGATIVE Final    Comment: (NOTE) SARS-CoV-2 target nucleic acids are NOT DETECTED. The SARS-CoV-2 RNA is generally detectable in upper and lower respiratory specimens during the acute phase of infection. Negative results do not preclude SARS-CoV-2 infection, do not rule out co-infections with other pathogens, and should not be used as the sole basis for treatment or other patient management decisions. Negative results must be combined with clinical observations, patient history, and epidemiological information. The expected result is Negative. Fact Sheet for Patients: SugarRoll.be Fact Sheet for Healthcare Providers: https://www.woods-mathews.com/ This test is not yet approved or cleared by the [place] FDA and  has been authorized for detection and/or diagnosis of SARS-CoV-2 by FDA under an Emergency Use Authorization (EUA). This EUA will remain  in effect (meaning this test  can be used) for the duration of the COVID-19 declaration under Section 56 4(b)(1) of the Act, 21 U.S.C. section 360bbb-3(b)(1), unless the authorization is terminated or revoked sooner. Performed at [facility], [place] [address], [place], [place] [address]          Radiology Studies: Dg Chest Port 1 View  Result Date: 12/03/2018 CLINICAL DATA:  Shortness of breath. Left upper chest pain. EXAM: PORTABLE CHEST 1 VIEW COMPARISON:  Chest x-rays dated 12/02/2018 and 09/17/2018 and 01/27/2018 and chest CT dated 01/28/2018 FINDINGS: Heart size and pulmonary vascularity are normal. There is slight chronic accentuation of the interstitial markings at the lung bases posteriorly. Slight atelectasis at the left lung base. Bones are normal. IMPRESSION: 1. Slight atelectasis at the  left lung base. 2. Slight chronic accentuation of the markings at the bases posteriorly as demonstrated on the prior chest CT. Electronically Signed   By: Lorriane Shire M.D.   On: 12/03/2018 20:29   Dg Chest Port 1 View  Result Date: 12/02/2018 CLINICAL DATA:  Dyspnea. EXAM: PORTABLE CHEST 1 VIEW COMPARISON:  09/17/2018 FINDINGS: The heart size remains enlarged. There are prominent interstitial lung markings bilaterally which appear similar to prior study. There is no pneumothorax. No large pleural effusion. No acute osseous abnormality. IMPRESSION: Cardiomegaly with prominent interstitial lung markings. This is similar to x-ray dated 09/17/2018. Differential considerations include congestive heart failure versus an atypical infectious process such as viral pneumonia. Electronically Signed   By: Constance Holster M.D.   On: 12/02/2018 20:51        Scheduled Meds: . amLODipine  10 mg Oral QHS  . aspirin EC  81 mg Oral Daily  . Chlorhexidine Gluconate Cloth  6 each Topical Q0600  . cholecalciferol  5,000 Units Oral Daily  . cinacalcet  90 mg Oral Q supper  . diphenhydrAMINE  50 mg Oral QHS  . doxercalciferol  7 mcg Intravenous Q M,W,F-HD  . enalaprilat  1.25 mg Intravenous Once  . guaiFENesin  1,200 mg Oral BID  . hydrALAZINE  100 mg Oral Q8H  . insulin aspart  0-9 Units Subcutaneous TID WC  . isosorbide mononitrate  30 mg Oral QHS  . ketoconazole   Topical Daily  . lidocaine-prilocaine  1 application Topical Q M,W,F-HD  . metoprolol tartrate  100 mg Oral BID  . mometasone-formoterol  2 puff Inhalation BID  . multivitamin  1 tablet Oral QHS  . nicotine  21 mg Transdermal Daily  . pantoprazole  40 mg Oral Daily  . pravastatin  40 mg Oral QHS  . sevelamer carbonate  2,400 mg Oral TID WC   Continuous Infusions: . sodium chloride    . sodium chloride    . cefTRIAXone (ROCEPHIN)  IV Stopped (12/03/18 0436)     LOS: 2 days     Cordelia Poche, MD [facility] 12/04/2018,  9:50 AM  If 7PM-7AM, please contact night-coverage [email or web address]

## 2018-12-04 NOTE — Progress Notes (Signed)
Pt signed AMA for termination of dialysis treatment, terminated with 1 hr and 45 minutes left of treatment. Per report, pt became agitated after being encouraged to utilize urinal or bed pan while on dialysis machine. However when patient offered otherwise, pt continued to remain adamant that he was done with his treatment. Pt advised of risks associated with insufficient dialysis, pt continued to decline. Samantha Collins-PA made aware. Pt stable upon transfer.

## 2018-12-04 NOTE — Progress Notes (Addendum)
[facility] Progress Note   Subjective:   Patient seen in room, seated on bedside on O2 via nasal cannula. Reports only minimal improvement in SOB since yesterday. Has some sharp chest/back pains with deep breath. Denies cough, palpitations, dizziness, abdominal pain, N/V, diaphoresis. Says he is comfortable when CPAP is on.   Objective Vitals:   12/03/18 2111 12/03/18 2125 12/04/18 0548 12/04/18 0936  BP: (!) 149/74 (!) 155/72 (!) 151/69   Pulse: 84 88 74   Resp: 16  16   Temp: 98.5 F (36.9 C) 99.7 F (37.6 C) 98.6 F (37 C)   TempSrc: Oral Oral Oral   SpO2: 93% 95% 93% 91%  Weight:      Height:       Physical Exam General:Well developed, well nourished male. Alert. Heart: RRR, no murmurs, rubs or gallops Lungs: Diffuse upper airway sounds with crackles at lung bases. Has to take occasional breaks from speaking to catch his breath.  Abdomen: Soft, non-tender, non-distended, + BS Extremities: No peripheral edema Dialysis Access: LUE AVF + bruit  Additional Objective Labs: Basic Metabolic Panel: Recent Labs  Lab 12/02/18 2031 12/02/18 2052 12/03/18 0333 12/03/18 1910  NA 140 140 139 136  K 4.4 4.1 3.9 4.1  CL 98  --  98 95*  CO2 23  --  24 23  GLUCOSE 129*  --  181* 112*  BUN 48*  --  53* 29*  CREATININE 12.83*  --  13.84* 9.13*  CALCIUM 9.0  --  8.6* 9.2  PHOS  --   --   --  5.4*   Liver Function Tests: Recent Labs  Lab 12/02/18 2031 12/03/18 0333 12/03/18 1910  AST 20 16  --   ALT 16 14  --   ALKPHOS 91 81  --   BILITOT 0.9 0.5  --   PROT 7.6 6.8  --   ALBUMIN 4.0 3.4* 4.0   No results for input(s): LIPASE, AMYLASE in the last 168 hours. CBC: Recent Labs  Lab 12/02/18 2031 12/02/18 2052 12/03/18 0333 12/03/18 1910  WBC 6.9  --  6.0 6.3  NEUTROABS 5.5  --   --   --   HGB 13.8 13.9 11.9* 13.6  HCT 44.1 41.0 37.8* 43.4  MCV 95.5  --  95.7 94.3  PLT 98*  --  89* 86*   Blood Culture    Component Value Date/Time   SDES BLOOD  RIGHT ARM 08/12/2018 0805   SPECREQUEST  08/12/2018 0805    BOTTLES DRAWN AEROBIC AND ANAEROBIC Blood Culture results may not be optimal due to an excessive volume of blood received in culture bottles   CULT  08/12/2018 0805    NO GROWTH 5 DAYS Performed at [facility] [address], [place], [place] [address]    REPTSTATUS 08/17/2018 FINAL 08/12/2018 0805    CBG: Recent Labs  Lab 12/03/18 0822 12/04/18 0102 12/04/18 0752  GLUCAP 97 142* 102*    Studies/Results: Dg Chest Port 1 View  Result Date: 12/04/2018 CLINICAL DATA:  Dyspnea. EXAM: PORTABLE CHEST 1 VIEW COMPARISON:  12/03/2018 FINDINGS: The heart size and pulmonary vascularity are normal. The lungs are now clear except for the chronic accentuation at the bases. The atelectasis at the left base on the prior study has resolved. IMPRESSION: No active disease in the chest. Chronic accentuation of the lung bases. Electronically Signed   By: Lorriane Shire M.D.   On: 12/04/2018 10:11   Dg Chest [facility]  Result Date: 12/03/2018 CLINICAL DATA:  Shortness of breath. Left upper chest pain. EXAM: PORTABLE CHEST 1 VIEW COMPARISON:  Chest x-rays dated 12/02/2018 and 09/17/2018 and 01/27/2018 and chest CT dated 01/28/2018 FINDINGS: Heart size and pulmonary vascularity are normal. There is slight chronic accentuation of the interstitial markings at the lung bases posteriorly. Slight atelectasis at the left lung base. Bones are normal. IMPRESSION: 1. Slight atelectasis at the left lung base. 2. Slight chronic accentuation of the markings at the bases posteriorly as demonstrated on the prior chest CT. Electronically Signed   By: Lorriane Shire M.D.   On: 12/03/2018 20:29   Dg Chest Port 1 View  Result Date: 12/02/2018 CLINICAL DATA:  Dyspnea. EXAM: PORTABLE CHEST 1 VIEW COMPARISON:  09/17/2018 FINDINGS: The heart size remains enlarged. There are prominent interstitial lung markings bilaterally which appear similar to prior study.  There is no pneumothorax. No large pleural effusion. No acute osseous abnormality. IMPRESSION: Cardiomegaly with prominent interstitial lung markings. This is similar to x-ray dated 09/17/2018. Differential considerations include congestive heart failure versus an atypical infectious process such as viral pneumonia. Electronically Signed   By: Constance Holster M.D.   On: 12/02/2018 20:51   Medications: . sodium chloride    . sodium chloride    . cefTRIAXone (ROCEPHIN)  IV Stopped (12/03/18 0436)   . amLODipine  10 mg Oral QHS  . aspirin EC  81 mg Oral Daily  . Chlorhexidine Gluconate Cloth  6 each Topical Q0600  . cholecalciferol  5,000 Units Oral Daily  . cinacalcet  90 mg Oral Q supper  . diphenhydrAMINE  50 mg Oral QHS  . doxercalciferol  7 mcg Intravenous Q M,W,F-HD  . enalaprilat  1.25 mg Intravenous Once  . guaiFENesin  1,200 mg Oral BID  . hydrALAZINE  100 mg Oral Q8H  . insulin aspart  0-9 Units Subcutaneous TID WC  . isosorbide mononitrate  30 mg Oral QHS  . ketoconazole   Topical Daily  . lidocaine-prilocaine  1 application Topical Q M,W,F-HD  . metoprolol tartrate  100 mg Oral BID  . mometasone-formoterol  2 puff Inhalation BID  . multivitamin  1 tablet Oral QHS  . nicotine  21 mg Transdermal Daily  . pantoprazole  40 mg Oral Daily  . pravastatin  40 mg Oral QHS  . sevelamer carbonate  2,400 mg Oral TID WC    Dialysis Orders: Center: [facility]  on MWF 180NRe, BFR 450, DFR Auto 2, T 4:15hr, EDW 94kg, 2K, 2Ca, LUE AVF  Hectorol 7 mcg IV q HD Heparin 4000 unit bolus Renvela 800 mg 3 tabs PO TID Sensipar 90 mgdaily   Assessment/Plan: 1. Shortness of breath: COVID negative, afebrile. Exam and chest x-ray consistent with pulmonary edema. Repeat CXR improved, however still SOB on exam and requiring O2.  Will plan for extra HD today, next patient. UFG 3L. 2.  ESRD:  MWF schedule. K+ 4.1. Extra HD today as above. 3.  Hypertension: BP improved with home meds,  still moderately elevated. Historically poorly controlled. Expect further improvement with dialysis.  4.  Anemia: Hemoglobin 13.6. No ESA indicated.  5.  Metabolic bone disease: Corrected calcium 9.2, phos 5.4. Continue binder, hectorol and sensipar.   6.  Nutrition:  Renal diet with fluid restrictions. 7. T2DM: on insulin, per primary 8. Dysuria/hematuria: On ceftriaxone, urine culture ordered. Per primary.   Anice Paganini, PA-C 12/04/2018, 10:43 AM  [facility] Pager: [phone]  Pt seen, examined and agree w A/P  as above.  Kelly Splinter  MD 12/04/2018, 4:01 PM

## 2018-12-04 NOTE — Progress Notes (Signed)
No IV poles or pumps to hang meds on time. Pt IV occluded once started. Put in IV consult, pt refused new IV. IV team member left the floor before RN could come back from convincing and educating the pt. New IV consult was placed once RN convinced and educated pt. Still awaiting the new IV site.

## 2018-12-05 DIAGNOSIS — N186 End stage renal disease: Secondary | ICD-10-CM | POA: Diagnosis not present

## 2018-12-05 DIAGNOSIS — I160 Hypertensive urgency: Secondary | ICD-10-CM | POA: Diagnosis not present

## 2018-12-05 DIAGNOSIS — Z992 Dependence on renal dialysis: Secondary | ICD-10-CM | POA: Diagnosis not present

## 2018-12-05 DIAGNOSIS — J810 Acute pulmonary edema: Secondary | ICD-10-CM | POA: Diagnosis not present

## 2018-12-05 DIAGNOSIS — N2581 Secondary hyperparathyroidism of renal origin: Secondary | ICD-10-CM | POA: Diagnosis not present

## 2018-12-05 LAB — NOVEL CORONAVIRUS, NAA: SARS-CoV-2, NAA: NOT DETECTED

## 2018-12-05 LAB — GLUCOSE, CAPILLARY: Glucose-Capillary: 129 mg/dL — ABNORMAL HIGH (ref 70–99)

## 2018-12-05 MED ORDER — AZITHROMYCIN 250 MG PO TABS: 250.0000 mg | ORAL_TABLET | Freq: Every day | ORAL | 0 refills | Status: AC

## 2018-12-05 MED ORDER — PREDNISONE 20 MG PO TABS: 40.0000 mg | ORAL_TABLET | Freq: Every day | ORAL | 0 refills | Status: AC

## 2018-12-05 MED ORDER — NICOTINE 21 MG/24HR TD PT24: 21.0000 mg | MEDICATED_PATCH | Freq: Every day | TRANSDERMAL | 0 refills | Status: DC

## 2018-12-05 MED ORDER — IPRATROPIUM-ALBUTEROL 0.5-2.5 (3) MG/3ML IN SOLN: 3.0000 mL | RESPIRATORY_TRACT | 0 refills | Status: DC | PRN

## 2018-12-05 NOTE — Progress Notes (Signed)
Ambulated with pt in hallway. At rest SpO2 was 92% on RA. As pt ambulated 198ft in hall SpO2 increased to 97%. Upon returning to room, SpO2 decreased back to 92% on RA. Pt did note c/o lightheadedness. Dr. Lonny Prude informed.

## 2018-12-05 NOTE — Discharge Instructions (Signed)
Kirk [name],  You were in the hospital because of trouble breathing. You received dialysis but this may have been because of inflammation in your lungs from possible COPD. You will need to use breathing treatments to help breath. You will also get steroids to help reduce your wheezing symptoms and improving your shortness of breath. Please follow-up with your PCP and you will likely need to see a pulmonologist.

## 2018-12-05 NOTE — Progress Notes (Addendum)
Subjective:  Co sob, HD yest  And Today at OP unit , Seen standing at sink washing  Face."getting  ready to go"  Objective Vital signs in last 24 hours: Vitals:   12/05/18 0220 12/05/18 0612 12/05/18 0654 12/05/18 0800  BP:  135/80 136/76   Pulse:  64    Resp:  16    Temp:  (!) 97.3 F (36.3 C)    TempSrc:  Oral    SpO2: (!) 89% 99%  95%  Weight:      Height:       Weight change: -6.9 kg  Physical Exam: General: alert NAD  Heart: RRR, No M,r,g Lungs: Bilat  Soft Wheezing, Non labored  Breathing   Abdomen: Soft, non-tender, non-distended, + BS Extremities: Trace peripheral edema Dialysis Access: LUE AVF + bruit   Dialysis Orders: Center:[facility] MWF 180NRe, BFR 450, DFR Auto 2, T 4:15hr, EDW 94kg, 2K, 2Ca, LUE AVF  Hectorol 7 mcg IV q HD Heparin 4000 unit bolus Renvela 800 mg 3 tabs PO TID Sensipar 90 mgdaily  Problem/Plan:  1. Shortness of breath:COVID negative, afebrile consistent with pulmonary edema. Repeat CXR improved,after hd   for extra HD yest 2/2 sob ,resolved today, next hd at OP unit lower edw to at least 90 ,  2. ESRD:MWF schedule. K+ 4.1 3. Hypertension:BP improved with home meds, vol uf ,Historically poorly controlled.?? bp med compliaint  Expect further improvement with dialysis. 4. Anemia:Hemoglobin 13.6. No ESA indicated. 5. Metabolic bone disease:Corrected calcium 9.2, phos 5.4. Continue binder, hectorol and sensipar. 6. Nutrition:Renal diet with fluid restrictions. 7. T2DM: on insulin, per primary 8. Dysuria/hematuria: On ceftriaxone, urine culture ordered.no seen in computer  No symptoms this am . RX  Per primary  Ernest Haber, PA-C [place] [phone] 12/05/2018,11:04 AM  LOS: 3 days   Pt seen, examined and agree w A/P as above. Down 3kg from prior dry and breathing much better, will lower edw at OP HD and challenge further as needed.  Kelly Splinter  MD 12/05/2018, 12:12  PM    Labs: Basic Metabolic Panel: Recent Labs  Lab 12/02/18 2031 12/02/18 2052 12/03/18 0333 12/03/18 1910  NA 140 140 139 136  K 4.4 4.1 3.9 4.1  CL 98  --  98 95*  CO2 23  --  24 23  GLUCOSE 129*  --  181* 112*  BUN 48*  --  53* 29*  CREATININE 12.83*  --  13.84* 9.13*  CALCIUM 9.0  --  8.6* 9.2  PHOS  --   --   --  5.4*   Liver Function Tests: Recent Labs  Lab 12/02/18 2031 12/03/18 0333 12/03/18 1910  AST 20 16  --   ALT 16 14  --   ALKPHOS 91 81  --   BILITOT 0.9 0.5  --   PROT 7.6 6.8  --   ALBUMIN 4.0 3.4* 4.0   No results for input(s): LIPASE, AMYLASE in the last 168 hours. No results for input(s): AMMONIA in the last 168 hours. CBC: Recent Labs  Lab 12/02/18 2031 12/02/18 2052 12/03/18 0333 12/03/18 1910  WBC 6.9  --  6.0 6.3  NEUTROABS 5.5  --   --   --   HGB 13.8 13.9 11.9* 13.6  HCT 44.1 41.0 37.8* 43.4  MCV 95.5  --  95.7 94.3  PLT 98*  --  89* 86*   Cardiac Enzymes: No results for input(s): CKTOTAL, CKMB, CKMBINDEX, TROPONINI in the last 168 hours.  CBG: Recent Labs  Lab 12/04/18 0752 12/04/18 1145 12/04/18 1733 12/04/18 2109 12/05/18 0752  GLUCAP 102* 146* 125* 135* 129*    Studies/Results: Dg Chest Port 1 View  Result Date: 12/04/2018 CLINICAL DATA:  Dyspnea. EXAM: PORTABLE CHEST 1 VIEW COMPARISON:  12/03/2018 FINDINGS: The heart size and pulmonary vascularity are normal. The lungs are now clear except for the chronic accentuation at the bases. The atelectasis at the left base on the prior study has resolved. IMPRESSION: No active disease in the chest. Chronic accentuation of the lung bases. Electronically Signed   By: Lorriane Shire M.D.   On: 12/04/2018 10:11   Dg Chest Port 1 View  Result Date: 12/03/2018 CLINICAL DATA:  Shortness of breath. Left upper chest pain. EXAM: PORTABLE CHEST 1 VIEW COMPARISON:  Chest x-rays dated 12/02/2018 and 09/17/2018 and 01/27/2018 and chest CT dated 01/28/2018 FINDINGS: Heart size and  pulmonary vascularity are normal. There is slight chronic accentuation of the interstitial markings at the lung bases posteriorly. Slight atelectasis at the left lung base. Bones are normal. IMPRESSION: 1. Slight atelectasis at the left lung base. 2. Slight chronic accentuation of the markings at the bases posteriorly as demonstrated on the prior chest CT. Electronically Signed   By: Lorriane Shire M.D.   On: 12/03/2018 20:29   Medications: . cefTRIAXone (ROCEPHIN)  IV 1 g (12/04/18 2346)   . amLODipine  10 mg Oral QHS  . aspirin EC  81 mg Oral Daily  . Chlorhexidine Gluconate Cloth  6 each Topical Q0600  . Chlorhexidine Gluconate Cloth  6 each Topical Q0600  . cholecalciferol  5,000 Units Oral Daily  . cinacalcet  90 mg Oral Q supper  . diphenhydrAMINE  50 mg Oral QHS  . doxercalciferol  7 mcg Intravenous Q M,W,F-HD  . enalaprilat  1.25 mg Intravenous Once  . guaiFENesin  1,200 mg Oral BID  . hydrALAZINE  100 mg Oral Q8H  . insulin aspart  0-9 Units Subcutaneous TID WC  . isosorbide mononitrate  30 mg Oral QHS  . ketoconazole   Topical Daily  . lidocaine-prilocaine  1 application Topical Q M,W,F-HD  . metoprolol tartrate  100 mg Oral BID  . mometasone-formoterol  2 puff Inhalation BID  . multivitamin  1 tablet Oral QHS  . nicotine  21 mg Transdermal Daily  . pantoprazole  40 mg Oral Daily  . pravastatin  40 mg Oral QHS  . predniSONE  40 mg Oral Q breakfast  . sevelamer carbonate  2,400 mg Oral TID WC

## 2018-12-05 NOTE — Progress Notes (Signed)
Pt discharged from unit. Instructions regarding breathing and O2 management given. New medications explained as well as side effects. Pt expressed understanding. IV removed. Pt ambulated from unit in care of wife.

## 2018-12-05 NOTE — Discharge Summary (Signed)
Physician Discharge Summary  Kirk [name] VZD:[id number] DOB: Feb 02, 1960 DOA: 12/02/2018  PCP: Gayland Curry, DO  Admit date: 12/02/2018 Discharge date: 12/05/2018  Admitted From: Home Disposition: Home  Recommendations for Outpatient Follow-up:  1. Follow up with PCP in 1 week 2. Please obtain BMP/CBC in one week 3. Pulmonology follow-up for PFTs 4. Please follow up on the following pending results: None  Home Health: None Equipment/Devices: None  Discharge Condition: Stable CODE STATUS: Full code Diet recommendation: Renal diet   Brief/Interim Summary:  Admission HPI written by Elwyn Reach, MD   Chief Complaint: Shortness of breath and cough  HPI: Kirk [name] is a 58 y.o. 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  Patient has been to dialysis yesterday.  Has had subjective fever.  He has had gradual worsening of his cough which has been persistent.  Was seen in the outpatient setting with COVID-19 screening ordered results still pending.  No known Covid exposure.  No chest pain.  Patient is unable to lay down flat.  He has PND and orthopnea.  He is here with his spouse who is the primary historian.  No one else is sick at home.  Evaluation indicated possible fluid overload versus pneumonia.  Patient is being admitted for treatment.  ED Course: Patient's temperature is 99 blood pressure 232/104 pulse 107 respiratory 34 oxygen sats 90% of 2 L.  His white count is 6.9 hemoglobin 13.9 and platelets of 98.  Sodium 140 potassium 4.0 chloride 98.  His BUN is 40 creatinine 12.83.  Glucose 129.  ABG showed a pH of 7.3 HC  PCO2 47 PO2 145 on 4 L.  BNP 1139.  Point of 27.  Alcohol level less than 10.  Chest x-ray showed pulmonary edema with possible infiltrates.  Patient is being admitted to the hospital for treatment   Hospital course:  Dyspnea Secondary to fluid overload. Concern for possible acute heart failure but possibly related to ESRD and hypertension exacerbating fluid imbalance. Required BiPAP on admission. He initially did well but this morning, some mild respiratory distress on room air. Patient with significant wheezing which responded to bronchodilators and steroids. Likely combination of fluid overload and bronchitis with concern for COPD. Patient received two sessions of HD (second session was cut short secondary to patient leaving session AMA). Started on prednisone with improvement. Discharged with prednisone  Acute respiratory failure with hypoxia Required BiPap initially with eventual wean to room air with management mentioned above  Hypertensive urgency In setting of fluid overload. Patient is on Imdur, hydralazine and amlodipine. Likely worse secondary to fluid overload. Continue Imdur, hydralazine, amlodipine.  Chronic systolic heart failure Recent EF of 40-45% on Transthoracic Echocardiogram from 8/25.  ESRD HD MWF. HD managed by nephrology  Cough Concern for pneumonia on admission. Started on ceftriaxone. Possibly related to fluid overload. Improved.  Dysuria/hematuria Patient already started on ceftriaxone on admission for above. Low risk for STDs per patient history. Treated empirically with ceftriaxone.  OSA CPAP  Discharge Diagnoses:  Principal Problem:   Dyspnea and respiratory abnormalities Active Problems:   OSA on CPAP   History of kidney transplant   Type 2 diabetes mellitus with  diabetic nephropathy, without long-term current use of insulin (HCC)   ESRD on dialysis [facility])   Cigarette smoker   Fluid overload   Hypertensive urgency   Chronic systolic heart  failure [facility])    Discharge Instructions  Discharge Instructions    Call MD for:  difficulty breathing, headache or visual disturbances   Complete by: As directed    Call MD for:  temperature >100.4   Complete by: As directed    Diet - low sodium heart healthy   Complete by: As directed    Increase activity slowly   Complete by: As directed      Allergies as of 12/05/2018      Reactions   Codeine Nausea And Vomiting      Medication List    TAKE these medications   acetaminophen 500 MG tablet Commonly known as: TYLENOL Take 1 tablet (500 mg total) by mouth every 6 (six) hours as needed for moderate pain.   albuterol 108 (90 Base) MCG/ACT inhaler Commonly known as: VENTOLIN HFA Inhale 2 puffs into the lungs every 6 (six) hours as needed for wheezing or shortness of breath.   amLODipine 10 MG tablet Commonly known as: NORVASC Take 1 tablet (10 mg total) by mouth daily. What changed: when to take this   aspirin EC 81 MG tablet Take 81 mg by mouth daily.   azithromycin 250 MG tablet Commonly known as: Zithromax Take 1 tablet (250 mg total) by mouth daily for 3 days.   b complex-vitamin c-folic acid 0.8 MG Tabs tablet TAKE 1 TABLET BY MOUTH EVERY DAY   Benadryl 25 MG tablet Generic drug: diphenhydrAMINE Take 50 mg by mouth at bedtime.   budesonide-formoterol 160-4.5 MCG/ACT inhaler Commonly known as: Symbicort Inhale 2 puffs into the lungs 2 (two) times daily.   calcitRIOL 0.5 MCG capsule Commonly known as: ROCALTROL Take 2 capsules (1 mcg total) by mouth every Monday, Wednesday, and Friday with hemodialysis.   cinacalcet 30 MG tablet Commonly known as: SENSIPAR Take 90 mg by mouth daily with supper.   clonazePAM 0.5 MG tablet Commonly known as: KLONOPIN Take 1 tablet (0.5 mg total) by mouth 2 (two) times daily as needed for anxiety.   guaiFENesin 600 MG 12 hr tablet Commonly known as: MUCINEX Take 1,200 mg by mouth 2 (two) times daily.   hydrALAZINE  100 MG tablet Commonly known as: APRESOLINE Take 1 tablet (100 mg total) by mouth 3 (three) times daily.   ipratropium-albuterol 0.5-2.5 (3) MG/3ML Soln Commonly known as: DUONEB Take 3 mLs by nebulization every 4 (four) hours as needed (Shortness of breath).   isosorbide mononitrate 30 MG 24 hr tablet Commonly known as: IMDUR Take 1 tablet (30 mg total) by mouth daily. What changed: when to take this   lidocaine-prilocaine cream Commonly known as: EMLA Apply 1 application topically See admin instructions. Apply 1 to 2 hours prior to dialysis on Mondays, Wednesdays, and Fridays. Cover with saran wrap.   metoprolol tartrate 100 MG tablet Commonly known as: LOPRESSOR Take 1 tablet (100 mg total) by mouth 2 (two) times daily.   nicotine 21 mg/24hr patch Commonly known as: NICODERM CQ - dosed in mg/24 hours Place 1 patch (21 mg total) onto the skin daily. Start taking on: December 06, 2018   omeprazole 20 MG capsule Commonly known as: PRILOSEC TAKE 1 CAPSULE TWICE DAILY BEFORE A MEAL What changed:   how much to take  how to take this  when to take this  additional  instructions   ondansetron 4 MG disintegrating tablet Commonly known as: Zofran ODT Take 1 tablet (4 mg total) by mouth every 8 (eight) hours as needed for nausea or vomiting.   pravastatin 40 MG tablet Commonly known as: PRAVACHOL TAKE 1 TABLET EVERY EVENING What changed: when to take this   predniSONE 20 MG tablet Commonly known as: DELTASONE Take 2 tablets (40 mg total) by mouth daily with breakfast for 4 days. Start taking on: December 06, 2018   PRESCRIPTION MEDICATION Inhale into the lungs at bedtime. CPAP   traMADol 50 MG tablet Commonly known as: ULTRAM Take 1 tablet (50 mg total) by mouth daily as needed. What changed:   when to take this  reasons to take this   Velphoro 500 MG chewable tablet Generic drug: sucroferric oxyhydroxide Chew 500-1,500 mg by mouth See admin instructions.  Chew 3 tablets (1500 mg) by mouth up to three times daily with meals and 1 tablet (500 mg) with snacks   Vitamin D3 125 MCG (5000 UT) Tabs Take 5,000 Units by mouth daily.            Durable Medical Equipment  (From admission, onward)         Start     Ordered   12/05/18 1038  For home use only DME Nebulizer machine  Once    Question Answer Comment  Patient needs a nebulizer to treat with the following condition Bronchitis   Length of Need Lifetime      12/05/18 1037          Allergies  Allergen Reactions  . Codeine Nausea And Vomiting    Consultations:  Nephrology   Procedures/Studies: Dg Chest Port 1 View  Result Date: 12/04/2018 CLINICAL DATA:  Dyspnea. EXAM: PORTABLE CHEST 1 VIEW COMPARISON:  12/03/2018 FINDINGS: The heart size and pulmonary vascularity are normal. The lungs are now clear except for the chronic accentuation at the bases. The atelectasis at the left base on the prior study has resolved. IMPRESSION: No active disease in the chest. Chronic accentuation of the lung bases. Electronically Signed   By: Lorriane Shire M.D.   On: 12/04/2018 10:11   Dg Chest Port 1 View  Result Date: 12/03/2018 CLINICAL DATA:  Shortness of breath. Left upper chest pain. EXAM: PORTABLE CHEST 1 VIEW COMPARISON:  Chest x-rays dated 12/02/2018 and 09/17/2018 and 01/27/2018 and chest CT dated 01/28/2018 FINDINGS: Heart size and pulmonary vascularity are normal. There is slight chronic accentuation of the interstitial markings at the lung bases posteriorly. Slight atelectasis at the left lung base. Bones are normal. IMPRESSION: 1. Slight atelectasis at the left lung base. 2. Slight chronic accentuation of the markings at the bases posteriorly as demonstrated on the prior chest CT. Electronically Signed   By: Lorriane Shire M.D.   On: 12/03/2018 20:29   Dg Chest Port 1 View  Result Date: 12/02/2018 CLINICAL DATA:  Dyspnea. EXAM: PORTABLE CHEST 1 VIEW COMPARISON:  09/17/2018  FINDINGS: The heart size remains enlarged. There are prominent interstitial lung markings bilaterally which appear similar to prior study. There is no pneumothorax. No large pleural effusion. No acute osseous abnormality. IMPRESSION: Cardiomegaly with prominent interstitial lung markings. This is similar to x-ray dated 09/17/2018. Differential considerations include congestive heart failure versus an atypical infectious process such as viral pneumonia. Electronically Signed   By: Constance Holster M.D.   On: 12/02/2018 20:51      Subjective: No chest pain. Dyspnea improved. Ambulates without dyspnea.  Discharge Exam: Vitals:  12/05/18 0654 12/05/18 0800  BP: 136/76   Pulse:    Resp:    Temp:    SpO2:  95%   Vitals:   12/05/18 0220 12/05/18 0612 12/05/18 0654 12/05/18 0800  BP:  135/80 136/76   Pulse:  64    Resp:  16    Temp:  (!) 97.3 F (36.3 C)    TempSrc:  Oral    SpO2: (!) 89% 99%  95%  Weight:      Height:        General: Pt is alert, awake, not in acute distress Cardiovascular: RRR, S1/S2 +, no rubs, no gallops Respiratory: Wheezing bilaterally Abdominal: Soft, NT, ND, bowel sounds + Extremities: no cyanosis    The results of significant diagnostics from this hospitalization (including imaging, microbiology, ancillary and laboratory) are listed below for reference.     Microbiology: Recent Results (from the past 240 hour(s))  Novel Coronavirus, NAA ([facility])     Status: None   Collection Time: 12/02/18  1:10 PM   Specimen: Nasopharyngeal(NP) swabs in vial transport medium   NASOPHARYNGE  TESTING  Result Value Ref Range Status   SARS-CoV-2, NAA Not Detected Not Detected Final    Comment: Testing was performed using the Aptima SARS-CoV-2 assay. This nucleic acid amplification test was developed and its performance characteristics determined by [employer]. Nucleic acid amplification tests include PCR and TMA. This test has not been FDA cleared or  approved. This test has been authorized by FDA under an Emergency Use Authorization (EUA). This test is only authorized for the duration of time the declaration that circumstances exist justifying the authorization of the emergency use of in vitro diagnostic tests for detection of SARS-CoV-2 virus and/or diagnosis of COVID-19 infection under section 564(b)(1) of the Act, 21 U.S.C. 017PZW-2(H) (1), unless the authorization is terminated or revoked sooner. When diagnostic testing is negative, the possibility of a false negative result should be considered in the context of a patient's recent exposures and the presence of clinical signs and symptoms consistent with COVID-19. An individual without symptoms o f COVID-19 and who is not shedding SARS-CoV-2 virus would expect to have a negative (not detected) result in this assay.   SARS CORONAVIRUS 2 (TAT 6-24 HRS) Nasopharyngeal Nasopharyngeal Swab     Status: None   Collection Time: 12/02/18  8:35 PM   Specimen: Nasopharyngeal Swab  Result Value Ref Range Status   SARS Coronavirus 2 NEGATIVE NEGATIVE Final    Comment: (NOTE) SARS-CoV-2 target nucleic acids are NOT DETECTED. The SARS-CoV-2 RNA is generally detectable in upper and lower respiratory specimens during the acute phase of infection. Negative results do not preclude SARS-CoV-2 infection, do not rule out co-infections with other pathogens, and should not be used as the sole basis for treatment or other patient management decisions. Negative results must be combined with clinical observations, patient history, and epidemiological information. The expected result is Negative. Fact Sheet for Patients: SugarRoll.be Fact Sheet for Healthcare Providers: https://www.woods-mathews.com/ This test is not yet approved or cleared by the [place] FDA and  has been authorized for detection and/or diagnosis of SARS-CoV-2 by FDA under an Emergency  Use Authorization (EUA). This EUA will remain  in effect (meaning this test can be used) for the duration of the COVID-19 declaration under Section 56 4(b)(1) of the Act, 21 U.S.C. section 360bbb-3(b)(1), unless the authorization is terminated or revoked sooner. Performed at [facility], [place] [address] [place], [place] [address]   MRSA  PCR Screening     Status: None   Collection Time: 12/04/18  9:09 AM   Specimen: Nasopharyngeal  Result Value Ref Range Status   MRSA by PCR NEGATIVE NEGATIVE Final    Comment:        The GeneXpert MRSA Assay (FDA approved for NASAL specimens only), is one component of a comprehensive MRSA colonization surveillance program. It is not intended to diagnose MRSA infection nor to guide or monitor treatment for MRSA infections. Performed at [facility], [place] [address] [place], [place] [address]      Labs: BNP (last 3 results) Recent Labs    05/14/18 1453 09/17/18 1218 12/02/18 2031  BNP 1,092.0* 1,683.9* 2,595.6*   Basic Metabolic Panel: Recent Labs  Lab 12/02/18 2031 12/02/18 2052 12/03/18 0333 12/03/18 1910  NA 140 140 139 136  K 4.4 4.1 3.9 4.1  CL 98  --  98 95*  CO2 23  --  24 23  GLUCOSE 129*  --  181* 112*  BUN 48*  --  53* 29*  CREATININE 12.83*  --  13.84* 9.13*  CALCIUM 9.0  --  8.6* 9.2  PHOS  --   --   --  5.4*   Liver Function Tests: Recent Labs  Lab 12/02/18 2031 12/03/18 0333 12/03/18 1910  AST 20 16  --   ALT 16 14  --   ALKPHOS 91 81  --   BILITOT 0.9 0.5  --   PROT 7.6 6.8  --   ALBUMIN 4.0 3.4* 4.0   No results for input(s): LIPASE, AMYLASE in the last 168 hours. No results for input(s): AMMONIA in the last 168 hours. CBC: Recent Labs  Lab 12/02/18 2031 12/02/18 2052 12/03/18 0333 12/03/18 1910  WBC 6.9  --  6.0 6.3  NEUTROABS 5.5  --   --   --   HGB 13.8 13.9 11.9* 13.6  HCT 44.1 41.0 37.8* 43.4  MCV 95.5  --  95.7 94.3  PLT 98*  --  89* 86*   Cardiac Enzymes: No results  for input(s): CKTOTAL, CKMB, CKMBINDEX, TROPONINI in the last 168 hours. BNP: Invalid input(s): POCBNP CBG: Recent Labs  Lab 12/04/18 0752 12/04/18 1145 12/04/18 1733 12/04/18 2109 12/05/18 0752  GLUCAP 102* 146* 125* 135* 129*   D-Dimer No results for input(s): DDIMER in the last 72 hours. Hgb A1c No results for input(s): HGBA1C in the last 72 hours. Lipid Profile No results for input(s): CHOL, HDL, LDLCALC, TRIG, CHOLHDL, LDLDIRECT in the last 72 hours. Thyroid function studies No results for input(s): TSH, T4TOTAL, T3FREE, THYROIDAB in the last 72 hours.  Invalid input(s): FREET3 Anemia work up No results for input(s): VITAMINB12, FOLATE, FERRITIN, TIBC, IRON, RETICCTPCT in the last 72 hours. Urinalysis    Component Value Date/Time   COLORURINE YELLOW 02/19/2017 1520   APPEARANCEUR HAZY (A) 02/19/2017 1520   LABSPEC 1.012 02/19/2017 1520   PHURINE 8.0 02/19/2017 1520   GLUCOSEU 50 (A) 02/19/2017 1520   HGBUR MODERATE (A) 02/19/2017 1520   BILIRUBINUR NEGATIVE 02/19/2017 1520   KETONESUR NEGATIVE 02/19/2017 1520   PROTEINUR >=300 (A) 02/19/2017 1520   UROBILINOGEN 0.2 04/06/2014 1030   NITRITE NEGATIVE 02/19/2017 1520   LEUKOCYTESUR MODERATE (A) 02/19/2017 1520   Sepsis Labs Invalid input(s): PROCALCITONIN,  WBC,  LACTICIDVEN Microbiology Recent Results (from the past 240 hour(s))  Novel Coronavirus, NAA ([facility])     Status: None   Collection Time: 12/02/18  1:10 PM   Specimen: Nasopharyngeal(NP) swabs  in vial transport medium   NASOPHARYNGE  TESTING  Result Value Ref Range Status   SARS-CoV-2, NAA Not Detected Not Detected Final    Comment: Testing was performed using the Aptima SARS-CoV-2 assay. This nucleic acid amplification test was developed and its performance characteristics determined by [employer]. Nucleic acid amplification tests include PCR and TMA. This test has not been FDA cleared or approved. This test has been authorized by FDA  under an Emergency Use Authorization (EUA). This test is only authorized for the duration of time the declaration that circumstances exist justifying the authorization of the emergency use of in vitro diagnostic tests for detection of SARS-CoV-2 virus and/or diagnosis of COVID-19 infection under section 564(b)(1) of the Act, 21 U.S.C. 891QXI-5(W) (1), unless the authorization is terminated or revoked sooner. When diagnostic testing is negative, the possibility of a false negative result should be considered in the context of a patient's recent exposures and the presence of clinical signs and symptoms consistent with COVID-19. An individual without symptoms o f COVID-19 and who is not shedding SARS-CoV-2 virus would expect to have a negative (not detected) result in this assay.   SARS CORONAVIRUS 2 (TAT 6-24 HRS) Nasopharyngeal Nasopharyngeal Swab     Status: None   Collection Time: 12/02/18  8:35 PM   Specimen: Nasopharyngeal Swab  Result Value Ref Range Status   SARS Coronavirus 2 NEGATIVE NEGATIVE Final    Comment: (NOTE) SARS-CoV-2 target nucleic acids are NOT DETECTED. The SARS-CoV-2 RNA is generally detectable in upper and lower respiratory specimens during the acute phase of infection. Negative results do not preclude SARS-CoV-2 infection, do not rule out co-infections with other pathogens, and should not be used as the sole basis for treatment or other patient management decisions. Negative results must be combined with clinical observations, patient history, and epidemiological information. The expected result is Negative. Fact Sheet for Patients: SugarRoll.be Fact Sheet for Healthcare Providers: https://www.woods-mathews.com/ This test is not yet approved or cleared by the [place] FDA and  has been authorized for detection and/or diagnosis of SARS-CoV-2 by FDA under an Emergency Use Authorization (EUA). This EUA will remain   in effect (meaning this test can be used) for the duration of the COVID-19 declaration under Section 56 4(b)(1) of the Act, 21 U.S.C. section 360bbb-3(b)(1), unless the authorization is terminated or revoked sooner. Performed at [facility], [place] [address] [place], [place] [address]   MRSA PCR Screening     Status: None   Collection Time: 12/04/18  9:09 AM   Specimen: Nasopharyngeal  Result Value Ref Range Status   MRSA by PCR NEGATIVE NEGATIVE Final    Comment:        The GeneXpert MRSA Assay (FDA approved for NASAL specimens only), is one component of a comprehensive MRSA colonization surveillance program. It is not intended to diagnose MRSA infection nor to guide or monitor treatment for MRSA infections. Performed at [facility], [place] [address] [place], [place] [address]      Time coordinating discharge: 35 minutes  SIGNED:   Cordelia Poche, MD [facility] 12/05/2018, 10:37 AM

## 2018-12-08 DIAGNOSIS — N2581 Secondary hyperparathyroidism of renal origin: Secondary | ICD-10-CM | POA: Diagnosis not present

## 2018-12-08 DIAGNOSIS — N186 End stage renal disease: Secondary | ICD-10-CM | POA: Diagnosis not present

## 2018-12-08 DIAGNOSIS — Z992 Dependence on renal dialysis: Secondary | ICD-10-CM | POA: Diagnosis not present

## 2018-12-10 DIAGNOSIS — N186 End stage renal disease: Secondary | ICD-10-CM | POA: Diagnosis not present

## 2018-12-10 DIAGNOSIS — N2581 Secondary hyperparathyroidism of renal origin: Secondary | ICD-10-CM | POA: Diagnosis not present

## 2018-12-10 DIAGNOSIS — Z992 Dependence on renal dialysis: Secondary | ICD-10-CM | POA: Diagnosis not present

## 2018-12-11 ENCOUNTER — Ambulatory Visit: Payer: Medicare Other | Admitting: Podiatry

## 2018-12-12 DIAGNOSIS — N2581 Secondary hyperparathyroidism of renal origin: Secondary | ICD-10-CM | POA: Diagnosis not present

## 2018-12-12 DIAGNOSIS — N186 End stage renal disease: Secondary | ICD-10-CM | POA: Diagnosis not present

## 2018-12-12 DIAGNOSIS — Z992 Dependence on renal dialysis: Secondary | ICD-10-CM | POA: Diagnosis not present

## 2018-12-14 DIAGNOSIS — Z992 Dependence on renal dialysis: Secondary | ICD-10-CM | POA: Diagnosis not present

## 2018-12-14 DIAGNOSIS — N186 End stage renal disease: Secondary | ICD-10-CM | POA: Diagnosis not present

## 2018-12-14 DIAGNOSIS — N2581 Secondary hyperparathyroidism of renal origin: Secondary | ICD-10-CM | POA: Diagnosis not present

## 2018-12-16 DIAGNOSIS — N2581 Secondary hyperparathyroidism of renal origin: Secondary | ICD-10-CM | POA: Diagnosis not present

## 2018-12-16 DIAGNOSIS — N186 End stage renal disease: Secondary | ICD-10-CM | POA: Diagnosis not present

## 2018-12-16 DIAGNOSIS — Z992 Dependence on renal dialysis: Secondary | ICD-10-CM | POA: Diagnosis not present

## 2018-12-19 DIAGNOSIS — Z992 Dependence on renal dialysis: Secondary | ICD-10-CM | POA: Diagnosis not present

## 2018-12-19 DIAGNOSIS — N186 End stage renal disease: Secondary | ICD-10-CM | POA: Diagnosis not present

## 2018-12-19 DIAGNOSIS — N2581 Secondary hyperparathyroidism of renal origin: Secondary | ICD-10-CM | POA: Diagnosis not present

## 2018-12-22 DIAGNOSIS — N2581 Secondary hyperparathyroidism of renal origin: Secondary | ICD-10-CM | POA: Diagnosis not present

## 2018-12-22 DIAGNOSIS — Z992 Dependence on renal dialysis: Secondary | ICD-10-CM | POA: Diagnosis not present

## 2018-12-22 DIAGNOSIS — N186 End stage renal disease: Secondary | ICD-10-CM | POA: Diagnosis not present

## 2019-01-08 ENCOUNTER — Ambulatory Visit (INDEPENDENT_AMBULATORY_CARE_PROVIDER_SITE_OTHER): Payer: Medicare Other | Admitting: Internal Medicine

## 2019-01-08 ENCOUNTER — Other Ambulatory Visit: Payer: Self-pay

## 2019-01-08 ENCOUNTER — Encounter: Payer: Self-pay | Admitting: Internal Medicine

## 2019-01-08 DIAGNOSIS — J4530 Mild persistent asthma, uncomplicated: Secondary | ICD-10-CM | POA: Diagnosis not present

## 2019-01-08 DIAGNOSIS — F17210 Nicotine dependence, cigarettes, uncomplicated: Secondary | ICD-10-CM

## 2019-01-08 MED ORDER — BUDESONIDE-FORMOTEROL FUMARATE 160-4.5 MCG/ACT IN AERO: 2.0000 | INHALATION_SPRAY | Freq: Two times a day (BID) | RESPIRATORY_TRACT | 0 refills | Status: DC

## 2019-01-08 NOTE — Patient Instructions (Signed)
Plan A = Automatic = Always=    Symbicort 160 Take 2 puffs first thing in am and then another 2 puffs about 12 hours later.    Work on inhaler technique:  relax and gently blow all the way out then take a nice smooth deep breath back in, triggering the inhaler at same time you start breathing in.  Hold for up to 5 seconds if you can. Blow out thru nose. Rinse and gargle with water when done   think  of a golfer warming up before you use the inhaler   Plan B = Backup (to supplement plan A, not to replace it) Only use your albuterol (ventolin and proventil) inhaler as a rescue medication to be used if you can't catch your breath by resting or doing a relaxed purse lip breathing pattern.  - The less you use it, the better it will work when you need it. - Ok to use the inhaler up to 2 puffs  every 4 hours if you must but call for appointment if use goes up over your usual need - Don't leave home without it !!  (think of it like the spare tire for your car)   Plan C = Crisis (instead of Plan B but only if Plan B stops working) - only use your albuterol nebulizer if you first try Plan B and it fails to help > ok to use the nebulizer up to every 4 hours but if start needing it regularly call for immediate appointment   Please schedule a follow up visit in 6 months but call sooner if needed

## 2019-01-08 NOTE — Progress Notes (Signed)
Subjective:   Patient ID: Kirk [name], male    DOB: [birth year]     MRN: [id number]    Brief patient profile:  77 yobm active smoker  referred 10/18/2014 to [facility] for eval of ? Copd for clearance for lap chole  with  no airflow obst on spiromtery   10/18/2014 on HD since 2012    History of Present Illness  10/18/2014 1st [facility] office visit/ Kirk [name]   Chief Complaint  Patient presents with  . Advice Only    refer Dr. Nyoka Cowden. Denies any wheezing, no chest tx, no cough. Pt here to be evaluated for COPD.    variable sob with exertion - says can walk fine but no regular walking,  Some sob steps   rec Plan A = Backup=  symbiocrt 80 Take 2 puffs first thing in am and then another 2 puffs about 12 hours later.  Plan B = Back up Only use your combivent  as a rescue medication  Plan C = crisis Only use your nebulizer if you try B first and doesn't work You are cleared for surgery  > tol lap chole fine    Admit date: 04/29/2017 Discharge date: 05/02/2017  Admitted From: Home Disposition:  Home   Recommendations for Outpatient Follow-up:  1. Follow up with PCP in 1-2 weeks 2. Please obtain BMP/CBC in one week   Discharge Condition: Stable CODE STATUS:FULL Diet recommendation: Renal diet   Brief/Interim Summary: 58 year old male with end-stage renal disease on hemodialysis who presents with 48 hours of worsening malaise, associated with productive cough, headache, nausea and poor appetite. On his initial physical examination his temperature is 102.57F, heart rate 98, blood pressure 165/68, respiratory rate 26, oxygen saturation 91-92% on room air. Dry mucous membranes,mild pallor, lungs with bilateral rhonchi, heart S1-S2 present rhythmic, abdomen soft nontender, no lower extremity edema. Sodium 138, potassium 3.1, chloride 94, bicarb 29, glucose 136, BUN 22, creatinine 7.57, white count 5.5,hemoglobin 10.3, hematocrit 31.4, platelets 86.Chest x-ray with a small  patchy infiltrate at the right upper lobe.  Patient wasadmitted to the hospital with a working diagnosis of right upper lobe pneumonia,complicated by sepsis and hypokalemia.The patient was started on intravenous antibiotics. Nephrology was consulted to assist with dialysis.    Discharge Diagnoses:  Sepsis -due to pneumonia -continue ceftriaxone and azithromycin -pt has defervesced--no fever in the last 24 hours -Initially presented with fever of 102.5 F -Blood cultures remain negative -Personally reviewed chest x-ray--mild right upper lobe opacity -most recent temp 98.93F at 1240 on 05/01/17  Healthcare associated pneumonia -Defervesced on ceftriaxone and azithromycin -Check procalcitonin--1.99 -Influenza PCR--neg -Viral respiratory panel--positive coronavirus -home with cefdinir and azithromycin x 4 more days to finish 7 days of tx  Intractable headache -Likely toxic headache from the patient's sepsis -CT brain--neg for acute findings -Continue tramadol as needed severe headache -benadryl and compazine IV x 1 and IV solumedrol-->improved  ESRD -Last received dialysis for 05/01/2017 -Nephrology following -Metabolic bone disease per nephrology  Essential hypertension -Continue metoprolol tartrate and hydralazine -remains high  COPD Exacerbation -mild wheezing on exam-->improved with steroids -start IV solumedrol--home with prednisone x 3 more days -continue bronchodilators -started pulmicort while inpatient -stable on RA      07/11/2017 transition of care post hosp  f/u ov/Kirk [name] re:   Active smoker /AB with restrictive spirometry ? Obesity related  Chief Complaint  Patient presents with  . Hospitalization Follow-up    follow up for PNA in both lungs last month 05/30/17.  Per patient, still has a productive cough with clear phlegm. Breathing is getting better.   Dyspnea:  7/11 ok not HT  So not back to basline yet, also limited by fatigue, esp p HD  MWF Cough: since pna coughing noct and early am x up to a tbsp, white mucus assoc with nasal congestion Sleep: up 30 degrees  SABA use:  Both hfa and neb/ confused with when to use  rec Plan A = Automatic = symbicort 80 Take 2 puffs first thing in am and then another 2 puffs about 12 hours later.  Work on inhaler technique:  relax and gently blow all the way out then take a nice smooth deep breath back in, triggering the inhaler at same time you start breathing in.  Hold for up to 5 seconds if you can. Blow out thru nose. Rinse and gargle with water when done Plan B = Backup Only use your albuterol (ventolin)as a rescue medication Plan C = Crisis - only use your albuterol nebulizer if you first try Plan B and it fails to help > ok to use the nebulizer up to every 4 hours but if start needing it regularly call for immediate appointment The key is to stop smoking completely before smoking completely stops you!    09/30/2017  f/u ov/Kirk [name] re: asthma/ smoker/ f/u [facility] pna Chief Complaint  Patient presents with  . Follow-up    Pt c/o increased cough with white sputum, SOB, nasal congestion, runny nose x 2 wks.   Dyspnea:  Walked at [employer] using rollator 2 d prior to OV   Cough: white esp hs  Sleeping: not needing saba no elevate  SABA use: more need over the last 2 weeks 2-3 days/ neb x 2  02: just at hd   rec Plan A = Automatic = symbicort increase to 160 Take 2 puffs first thing in am and then another 2 puffs about 12 hours later.  Work on [employer] B = Backup Only use your albuterol (ventolin)  as a rescue medication Plan C = Crisis - only use your albuterol nebulizer if you first try Plan B and it fails to help > ok to use the nebulizer up to every 4 hours but if start needing it regularly call for immediate appointment The key is to stop smoking completely before smoking completely stops you!     12/30/2017  f/u ov/Kirk [name] re: smoker with AB/ still smoking/  maint on symb 160 2bid Chief Complaint  Patient presents with  . Follow-up    Increased cough and wheezing for the past 2-3 wks. He is coughing up some clear sputum. He is using his albuterol inhaler about 4 x per day and neb with albuterol about 2 x per wk.    Dyspnea:  MMRC3 = can't walk 100 yards even at a slow pace at a flat grade s stopping due to sob   Cough: worse than usual, feels like a chest cold/ mucoid production only  Sleeping: sitting upright like on a couch SABA use: baseline neb once a month, up to qid x sev weeks with onset of "chest  cold" , saba does help rec Stop robitussin and use maximum dose of mucinex dm up to 1200 mg every 12hours as needed and  Take tramadol 50 mg every 4 hours if needed  Prednisone 10 mg take  4 each am x 2 days,   2 each am x 2 days,  1 each  am x 2 days and stop  Prilosec should be Take 30- 60 min before your first and last meals of the day  if not improving need a cxr at the old building and office visit here asap     01/24/2018 NP Chest x ray showed fluid overload Patient is on dialysis - will need more fluid taken off Please follow up with dialysis and cardiology mucinex dm up to 1200 mg every 12hours as needed and  Take tramadol 50 mg every 4 hours if needed  Prilosec should be Take 30- 60 min before your first and last meals of the day   Admit date: 01/27/2018 Discharge date: 01/30/2018   Diet recommendation: Renal diet   Brief/Interim Summary:  Admission HPI written by Ivor Costa, MD   Chief Complaint:SOB, chest pain,rectal bleeding  LAG:TXMIW B Badgettis a 58 y.o.malewith medical history significant ofhypertension, hyperlipidemia, asthma, GERD, gout, anxiety, anemia,ESRD-HD (MWF),OSA on CPAP, e-cigarette smoking, 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 He also reports chills, no fever. He reports intermittent chest pain, which is located in the front chest, sometimes 10 out of 10 severity, currentlychest pain-free, no tenderness in the calf areas. Patient cannot characterize further if hischest pain is pleuritic or exertional. He reports headache in the frontal head. No unilateral weakness, numbness or tingling his extremities. No facial droop or slurred speech. Patient states that he has intermittent rectal bleeding with bright red-colored blood recently.He was seen by his GI, Dr. Ardis Hughs in 12/2017 and was told to takefiber supplements.He has nausea and vomiting sometimes, currently no abdominal pain or diarrhea. He denies symptoms of UTI. Pt states that he was seenby his pulmonologist on Friday, and hadanegative chest x-ray.  ED Course:pt was found to have positive troponin 0.03, positive d-dimer at 0.76, potassium 3.3, bicarbonate 29, creatinine 8.45, BUN 18, hemoglobin 7.8 (11.7 on 06/28/2017, 9.0 on 06/05/2017), BMP 51.2, temperature normal, no tachycardia, oxygen saturation 88-->100% on RA,chest x-ray with cardiomegaly and vascular congestion. Patient is placed on telemetry bed of observation.    Hospital course:  Acute diastolic heart failure Pulmonary vascular congestion on chest x-ray. Transthoracic Echocardiogram significant for a normal EF with grade 2 diastolic dysfunction. Patient reports symptoms consistent with orthopnea. Improved with HD. Counseled on proper diet and information given prior to discharge.  Asthma exacerbation Patient without a history of COPD, but rather asthma. Significant wheezing requiring IV steroids and breathing treatments. Patient's wheezing and exercise tolerance improved with treatment. Also given a course of azithromycin. Continue 5 day total course as an outpatient. Continue home inhalers.  Chest  pain CTA negative for PE. Associated mild troponin elevation. Cardiology consulted. And recommended no inpatient workup since Transthoracic Echocardiogram not significant for wall motion abnormalities or decreased EF.  ESRD on HD HD per nephrology  Tobacco abuse Patient counseled prior this hospitalization.  Hemorrhoids Diet recommendations made for patient. Patient sees outpatient GI for history of rectal bleeding with a recent colonoscopy significant for precancerous polyps. Hemoglobin stable.  Acute on chronic diastolic heart failure Resolved with HD. Follow-up outpatient with cardiology.  Discharge Diagnoses:  Principal Problem:   Acute respiratory failure with hypoxia (HCC) Active Problems:   GIB (gastrointestinal bleeding)   OSA on CPAP   HTN (hypertension)   Anemia in chronic renal disease  Pancytopenia ([place])   ESRD on dialysis [facility])   Chest pain   Hypokalemia   Cigarette smoker   Elevated troponin     03/31/2018  f/u ov/Kirk [name] re: resumed smoking /HD MWF / cpap dr dohmeier  Chief Complaint  Patient presents with  . Follow-up    Breathing is overall doing well. He has not had to use the albuterol since the last visit.    Dyspnea:  MMRC3 = can't walk 100 yards even at a slow pace at a flat grade s stopping due to sob  (not able to reproduce this in office ) Cough: worse p supper / dry Sleeping: on cough ok on cpap  SABA use:  None  02: none  rec Work on inhaler technique:   Change symbicort to 80 Take 2 puffs first thing in am and then another 2 puffs about 12 hours later.  Only use your albuterol as a rescue medication   Please schedule a follow up visit in 3 months but call sooner if needed  with all medications /inhalers/ solutions in hand so we can verify exactly what you are taking. This includes all medications from all doctors and over the counters    07/08/2018  f/u ov/Kirk [name] re: AB still smoking  Chief Complaint  Patient presents with  . Follow-up     No complaints today.   Dyspnea:  MMRC1 = can walk nl pace, flat grade, can't hurry or go uphills or steps s sob   Cough: hs / not in am / min mucoid  Sleeping: on side /2 pillow/cpap  SABA use: no rescue inhalers 02: none  Very confused with details of care  rec Plan A = Automatic = Symbicort 160 Take 2 puffs first thing in am and then another 2 puffs about 12 hours later.  Work on inhaler technique:  Plan B = Backup Only use your albuterol inhaler as a rescue medication The key is to stop smoking completely before smoking completely stops you! Please schedule a follow up visit in 3 months but call sooner if needed - bring inhalers      10/09/2018  f/u ov/Kirk [name] re: AB still smoking and confusing symb vs albuterol  Chief Complaint  Patient presents with  . Follow-up    Breathing is "pretty good".  No new co's.  Dyspnea:  MMRC2 = can't walk a nl pace on a flat grade s sob but does fine slow and flat  Cough: none at all now Sleeping: on side bed flat SABA use:  Rarely but wife not sure about this   rec Plan A = Automatic = Always=    Symbicort 160 Take 2 puffs first thing in am and then another 2 puffs about 12 hours later.  Work on inhaler technique: think  of a golfer warming up before you use the inhaler  Plan B = Backup (to supplement plan A, not to replace it) Only use your albuterol (ventolin and proventil) inhaler as a rescue medication Plan C = Crisis (instead of Plan B but only if Plan B stops working) - only use your albuterol nebulizer  The high doses of lopressor may interfere with your breathing treatments but are probably ok for now    Admit date: 12/02/2018 Discharge date: 12/05/2018  Admitted From: Home Disposition: Home  Recommendations for Outpatient Follow-up:  1. Follow up with PCP in 1 week 2. Please obtain BMP/CBC in one week 3. Pulmonology follow-up for PFTs 4. Please follow up on the following  pending results: None  Home Health:  None Equipment/Devices: None  Discharge Condition: Stable CODE STATUS: Full code Diet recommendation: Renal diet   Brief/Interim Summary:  Admission HPI written by Elwyn Reach, MD   Chief Complaint:Shortness of breath and cough  NUU:VOZDG B Badgettis a 58 y.o.malewith medical history significant ofsystolic dysfunction CHF with EF 45% from August of this year, end-stage renal disease on hemodialysis Mondays Wednesdays and Fridays, history of failed kidney transplant, diabetes, tobacco abuse, obstructive sleep apnea, uncontrolled hypertension, asthma with COPD who presented to the ER with persistent cough and shortness of breath for the last 24 hours. Patient has been to dialysis yesterday. Has had subjective fever. He has had gradual worsening of his cough which has been persistent. Was seen in the outpatient setting with COVID-19 screening ordered results still pending. No known Covid exposure. No chest pain. Patient is unable to lay down flat. He has PND and orthopnea. He is here with his spouse who is the primary historian. No one else is sick at home. Evaluation indicated possible fluid overload versus pneumonia. Patient is being admitted for treatment.  ED Course:Patient's temperature is 99 blood pressure 232/104 pulse 107 respiratory 34 oxygen sats 90% of 2 L. His white count is 6.9 hemoglobin 13.9 and platelets of 98. Sodium 140 potassium 4.0 chloride 98. His BUN is 40 creatinine 12.83. Glucose 129. ABG showed a pH of 7.3 HC PCO2 47 PO2 145 on 4 L. BNP 1139. Point of 27. Alcohol level less than 10. Chest x-ray showed pulmonary edema with possible infiltrates. Patient is being admitted to the hospital for treatment   Hospital course:  Dyspnea Secondary to fluid overload. Concern for possible acute heart failure but possibly related to ESRD and hypertension exacerbating fluid imbalance. Required BiPAP on admission. He initially did well but this  morning, some mild respiratory distress on room air. Patient with significant wheezing which responded to bronchodilators and steroids. Likely combination of fluid overload and bronchitis with concern for COPD. Patient received two sessions of HD (second session was cut short secondary to patient leaving session AMA). Started on prednisone with improvement. Discharged with prednisone  Acute respiratory failure with hypoxia Required BiPap initially with eventual wean to room air with management mentioned above  Hypertensive urgency In setting of fluid overload. Patient is on Imdur, hydralazine and amlodipine. Likely worse secondary to fluid overload. Continue Imdur, hydralazine, amlodipine.  Chronic systolic heart failure Recent EF of 40-45% on Transthoracic Echocardiogram from 8/25.  ESRD HD MWF. HD managed by nephrology  Cough Concern for pneumonia on admission. Started on ceftriaxone. Possibly related to fluid overload. Improved.  Dysuria/hematuria Patient already started on ceftriaxone on admission for above. Low risk for STDs per patient history. Treated empirically with ceftriaxone.  OSA CPAP  Discharge Diagnoses:  Principal Problem:   Dyspnea and respiratory abnormalities Active Problems:   OSA on CPAP   History of kidney transplant   Type 2 diabetes mellitus with diabetic nephropathy, without long-term current use of insulin (HCC)   ESRD on dialysis [facility])   Cigarette smoker   Fluid overload   Hypertensive urgency   Chronic systolic heart failure ([place])     01/08/2019  f/u ov/Kirk [name] re: AB / symb 160 2bid  Chief Complaint  Patient presents with  . Follow-up    Breathing is doing well today and no new co's.    Dyspnea:  Walking at mall x 20min-20 min avg pace x up to sev times a week Cough: none  Sleeping:  able to lie in flat bed  SABA use: rarely use saba / no neb  02: no  No change breathing Sunday pm vs any other day - HD  MWF   No obvious day to day  or daytime variability or assoc excess/ purulent sputum or mucus plugs or hemoptysis or cp or chest tightness, subjective wheeze or overt sinus or hb symptoms.   Sleeping  without nocturnal  or early am exacerbation  of respiratory  c/o's or need for noct saba. Also denies any obvious fluctuation of symptoms with weather or environmental changes or other aggravating or alleviating factors except as outlined above   No unusual exposure hx or h/o childhood pna/ asthma or knowledge of premature birth.  Current Allergies, Complete Past Medical History, Past Surgical History, Family History, and Social History were reviewed in [employer] record.  ROS  The following are not active complaints unless bolded Hoarseness, sore throat, dysphagia, dental problems, itching, sneezing,  nasal congestion or discharge of excess mucus or purulent secretions, ear ache,   fever, chills, sweats, unintended wt loss or wt gain, classically pleuritic or exertional cp,  orthopnea pnd or arm/hand swelling  or leg swelling, presyncope, palpitations, abdominal pain, anorexia, nausea, vomiting, diarrhea  or change in bowel habits or change in bladder habits, change in stools or change in urine, dysuria, hematuria,  rash, arthralgias, visual complaints, headache, numbness, weakness or ataxia or problems with walking or coordination,  change in mood or  memory.        Current Meds  Medication Sig  . acetaminophen (TYLENOL) 500 MG tablet Take 1 tablet (500 mg total) by mouth every 6 (six) hours as needed for moderate pain.  Marland Kitchen albuterol (PROVENTIL HFA;VENTOLIN HFA) 108 (90 Base) MCG/ACT inhaler Inhale 2 puffs into the lungs every 6 (six) hours as needed for wheezing or shortness of breath.  Marland Kitchen amLODipine (NORVASC) 10 MG tablet Take 1 tablet (10 mg total) by mouth daily. (Patient taking differently: Take 10 mg by mouth at bedtime. )  . aspirin EC 81 MG tablet Take 81 mg by mouth daily.  Marland Kitchen b complex-vitamin  c-folic acid (NEPHRO-VITE) 0.8 MG TABS tablet TAKE 1 TABLET BY MOUTH EVERY DAY (Patient taking differently: Take 1 tablet by mouth daily. )  . budesonide-formoterol (SYMBICORT) 160-4.5 MCG/ACT inhaler Inhale 2 puffs into the lungs 2 (two) times daily.  . calcitRIOL (ROCALTROL) 0.5 MCG capsule Take 2 capsules (1 mcg total) by mouth every Monday, Wednesday, and Friday with hemodialysis.  . Cholecalciferol (VITAMIN D3) 5000 UNITS TABS Take 5,000 Units by mouth daily.   . cinacalcet (SENSIPAR) 30 MG tablet Take 90 mg by mouth daily with supper.   . clonazePAM (KLONOPIN) 0.5 MG tablet Take 1 tablet (0.5 mg total) by mouth 2 (two) times daily as needed for anxiety.  . diphenhydrAMINE (BENADRYL) 25 MG tablet Take 50 mg by mouth at bedtime.   Marland Kitchen guaiFENesin (MUCINEX) 600 MG 12 hr tablet Take 1,200 mg by mouth 2 (two) times daily.  . hydrALAZINE (APRESOLINE) 100 MG tablet Take 1 tablet (100 mg total) by mouth 3 (three) times daily.  Marland Kitchen ipratropium-albuterol (DUONEB) 0.5-2.5 (3) MG/3ML SOLN Take 3 mLs by nebulization every 4 (four) hours as needed (Shortness of breath).  . isosorbide mononitrate (IMDUR) 30 MG 24 hr tablet Take 1 tablet (30 mg total) by mouth daily. (Patient taking differently: Take 30 mg by mouth at bedtime. )  . lidocaine-prilocaine (EMLA) cream Apply 1 application topically See admin instructions.  Apply 1 to 2 hours prior to dialysis on Mondays, Wednesdays, and Fridays. Cover with saran wrap.  . metoprolol tartrate (LOPRESSOR) 100 MG tablet Take 1 tablet (100 mg total) by mouth 2 (two) times daily.  Marland Kitchen omeprazole (PRILOSEC) 20 MG capsule TAKE 1 CAPSULE TWICE DAILY BEFORE A MEAL (Patient taking differently: Take 20 mg by mouth 2 (two) times daily before a meal. )  . ondansetron (ZOFRAN ODT) 4 MG disintegrating tablet Take 1 tablet (4 mg total) by mouth every 8 (eight) hours as needed for nausea or vomiting.  . pravastatin (PRAVACHOL) 40 MG tablet TAKE 1 TABLET EVERY EVENING (Patient taking  differently: Take 40 mg by mouth at bedtime. )  . PRESCRIPTION MEDICATION Inhale into the lungs at bedtime. CPAP  . sucroferric oxyhydroxide (VELPHORO) 500 MG chewable tablet Chew 500-1,500 mg by mouth See admin instructions. Chew 3 tablets (1500 mg) by mouth up to three times daily with meals and 1 tablet (500 mg) with snacks  . traMADol (ULTRAM) 50 MG tablet Take 1 tablet (50 mg total) by mouth daily as needed. (Patient taking differently: Take 50 mg by mouth at bedtime as needed for moderate pain. )          Objective:   Physical Exam    01/08/2019     209  10/09/2018       213  07/08/2018       222 03/31/2018         229  12/30/2017       222  09/30/2017         222  07/11/2017       217   10/18/14 233 lb 12.8 oz (106.051 kg)  10/05/14 235 lb 3.2 oz (106.686 kg)  09/20/14 230 lb 6.1 oz (104.5 kg)     amb bm nad  Vital signs reviewed - Note on arrival 02 sats  99% on RA     HEENT : pt wearing mask not removed for exam due to covid -19 concerns.    NECK :  without JVD/Nodes/TM/ nl carotid upstrokes bilaterally   LUNGS: no acc muscle use,  Nl contour chest with minimal insp/exp rhonchi bilaterally without cough on insp or exp maneuvers   CV:  RRR  no s3 or murmur or increase in P2, and no edema   ABD:  soft and nontender with nl inspiratory excursion in the supine position. No bruits or organomegaly appreciated, bowel sounds nl  MS:  Nl gait/ ext warm without deformities, calf tenderness, cyanosis or clubbing No obvious joint restrictions   SKIN: warm and dry without lesions    NEURO:  alert, approp, nl sensorium with  no motor or cerebellar deficits apparent.        I personally reviewed images and agree with radiology impression as follows:  pCXR:    12/04/2018 No active disease in the chest. Chronic accentuation of the lung bases.      Assessment & Plan:

## 2019-01-09 ENCOUNTER — Encounter: Payer: Self-pay | Admitting: Internal Medicine

## 2019-01-09 NOTE — Assessment & Plan Note (Signed)
Active smoker - Spirometry 07/11/2017  Restrictive/ min curvature on no rx - 07/11/2017   Try symb 80 2bid - 09/30/2017     try symbicort 160 2bid as has rhonchi on exam, still smoking  - Spirometry 03/31/2018  FEV1 1.4 (41%)  Ratio 0.72 min curvature p symb 160 x 2 in am with poor baseline hfa - 01/08/2019  After extensive coaching inhaler device,  effectiveness =    90% / reviewed golfer analogy taking practice inhalations using empty device   All goals of chronic asthma control met including optimal function and elimination of symptoms with minimal need for rescue therapy.  Contingencies discussed in full including contacting this office immediately if not controlling the symptoms using the rule of two's.     Pt informed of the seriousness of COVID 19 infection as a direct risk to their health  and safey and to those of their loved ones and should continue to wear facemask in public and minimize exposure to public locations but especially avoid any area or activity where non-close contacts are not observing distancing or wearing an appropriate face mask > rec take vaccine as soon as offered  >>>  F/u in 6 m, call sooner if needed

## 2019-01-09 NOTE — Assessment & Plan Note (Signed)
Counseled re importance of smoking cessation but did not meet time criteria for separate billing    I had an extended discussion with the patient/wife reviewing all relevant studies completed to date and  lasting 15 to 20 minutes of a 25 minute visit    I performed detailed device teaching using a teach back method which extended face to face time for this visit (see above)  Each maintenance medication was reviewed in detail including emphasizing most importantly the difference between maintenance and prns and under what circumstances the prns are to be triggered using an action plan format that is not reflected in the computer generated alphabetically organized AVS which I have not found useful in most complex patients, especially with respiratory illnesses  Please see AVS for specific instructions unique to this visit that I personally wrote and verbalized to the the pt in detail and then reviewed with pt  by my nurse highlighting any  changes in therapy recommended at today's visit to their plan of care.

## 2019-01-20 ENCOUNTER — Telehealth: Payer: Self-pay | Admitting: Internal Medicine

## 2019-01-20 ENCOUNTER — Ambulatory Visit (INDEPENDENT_AMBULATORY_CARE_PROVIDER_SITE_OTHER): Payer: Medicare Other | Admitting: Internal Medicine

## 2019-01-20 ENCOUNTER — Encounter: Payer: Self-pay | Admitting: Internal Medicine

## 2019-01-20 ENCOUNTER — Other Ambulatory Visit: Payer: Self-pay

## 2019-01-20 VITALS — BP 139/70 | HR 70 | Temp 97.3°F | Ht 67.0 in | Wt 212.0 lb

## 2019-01-20 DIAGNOSIS — I5021 Acute systolic (congestive) heart failure: Secondary | ICD-10-CM

## 2019-01-20 DIAGNOSIS — Z992 Dependence on renal dialysis: Secondary | ICD-10-CM

## 2019-01-20 DIAGNOSIS — N186 End stage renal disease: Secondary | ICD-10-CM | POA: Diagnosis not present

## 2019-01-20 DIAGNOSIS — I428 Other cardiomyopathies: Secondary | ICD-10-CM

## 2019-01-20 MED ORDER — AMLODIPINE BESYLATE 10 MG PO TABS: 10.0000 mg | ORAL_TABLET | Freq: Every day | ORAL | 11 refills | Status: DC

## 2019-01-20 NOTE — Patient Instructions (Signed)
Medication Instructions:  No changes *If you need a refill on your cardiac medications before your next appointment, please call your pharmacy*  Lab Work: None ordered If you have labs (blood work) drawn today and your tests are completely normal, you will receive your results only by: Marland Kitchen MyChart Message (if you have MyChart) OR . A paper copy in the mail If you have any lab test that is abnormal or we need to change your treatment, we will call you to review the results.  Testing/Procedures: None ordered  Follow-Up: At [facility], you and your health needs are our priority.  As part of our continuing mission to provide you with exceptional heart care, we have created designated Provider Care Teams.  These Care Teams include your primary Cardiologist (physician) and Advanced Practice Providers (APPs -  Physician Assistants and Nurse Practitioners) who all work together to provide you with the care you need, when you need it.  Your next appointment:   12 month(s)  The format for your next appointment:   In Person  Provider:   You may see [name], MD or one of the following Advanced Practice Providers on your designated Care Team:    Almyra Deforest, PA-C  Fabian Sharp, PA-C or   Roby Lofts, [place]

## 2019-01-20 NOTE — Telephone Encounter (Signed)
Sent to MD and covering nurse as FYI Patient has documented memory loss on problem list

## 2019-01-20 NOTE — Progress Notes (Signed)
OFFICE NOTE  Chief Complaint:  Hospital follow-up for heart failure  Primary Care Physician: Gayland Curry, DO  HPI:  Kirk [name] is a 58 y.o. 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  At that time he was having chest pain associated with dialysis which was felt to be musculoskeletal.  His last echocardiogram was in August 2018 which showed mild LVH and EF 55 to 60%.  He presented in 01/2018 with chest pain, worsening shortness of breath and rectal bleeding.  The pain was described as sharp and mostly occurred after dialysis.  It last for a few minutes to a couple hours.  Symptoms are actually somewhat similar to his symptoms back in 2013.  CT angiography was negative for PE however did show bronchitis and small airway bronchiolitis.  He was placed on antibiotics for this.  Troponin was mildly elevated 0.03 and went up to 0.09.  EKG this morning actually showed A. fib with RVR however currently he is in sinus rhythm. I personally reviewed both studies.  His CHADSVASC score is 1, therefore I would not recommend anticoagulation be on aspirin.  Echo in 01/2018 showed LVEF 55% without regional WMA's.   10/21/2018  Kirk [name] was recently seen in follow-up in August by Doreene Adas, PA-C, who was seeing him for follow-up.  He had had a syncopal episode.  He had missed some dialysis in July and was volume overloaded.  He struggled with uncontrolled hypertension as well.  He is also had some sharp chest pain which radiated down his right arm.  She went ahead and ordered a nuclear stress test which was performed on 09/02/2018 which showed an LVEF of 38% with diffuse hypokinesis and a small fixed mild mid to apical inferior perfusion defect  thought to be attenuation artifact.  Given the decreased LVEF, I recommended a limited echo to verify the LV function.  This was performed on 09/16/2018 demonstrated global hypokinesis with LVEF of 40 to 45% and grade 2 diastolic dysfunction.  Since that time he has had improvement in his symptoms and is better volume optimized on dialysis.  Blood pressure remains elevated, particularly systolics.  Today the office blood pressure was 180/82.  Given end-stage renal disease, his options for heart failure therapy are limited.  As previously mentioned he had no significant coronary disease and large coronary arteries by cath in 2013 by myself.  01/20/2019  Kirk [name] is seen today in follow-up for heart failure.  Overall he is doing well.  After adding some Imdur blood pressure is improved somewhat.  He is also had better volume management at dialysis.  Weight is actually 2 pounds lighter than it was previously.  His blood pressure is much better controlled today 139/70.  I reviewed blood pressure readings from dialysis over the past several months which showed a gradual improvement in blood pressure.  If we can maintain this, there is a good chance he may improve his LV function.  PMHx:  Past Medical History:  Diagnosis Date  . Acute edema of lung, unspecified   . Acute, but ill-defined, cerebrovascular disease   . Allergy   . Anemia   . 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)   . Anxiety   . Asthma   . Asthma    moderate  persistent  . Carpal tunnel syndrome   . Cellulitis and abscess of trunk   . Cholelithiasis 07/13/2014  . Chronic headaches   . Debility, unspecified   . Dermatophytosis of the body   . Dysrhythmia    history of  . Edema   . End stage renal disease on dialysis [facility])    "MWF; [facility] in [facility]" (10/21/2014)  . Essential hypertension, benign   . GERD (gastroesophageal reflux disease)   . Gout, unspecified   . HTN (hypertension)   . Hypertrophy of prostate  without urinary obstruction and other lower urinary tract symptoms (LUTS)   . Hypotension, unspecified   . Impotence of organic origin   . Insomnia, unspecified   . Kidney replaced by transplant   . Localization-related (focal) (partial) epilepsy and epileptic syndromes with complex partial seizures, without mention of intractable epilepsy   . Lumbago   . Memory loss   . OSA on CPAP   . Other and unspecified hyperlipidemia   . Other chronic nonalcoholic liver disease   . Other malaise and fatigue   . Other nonspecific abnormal serum enzyme levels   . Pain in joint, lower leg   . Pain in joint, upper arm   . Pneumonia "several times"  . Renal dialysis status(V45.11) 02/05/2010   restarted 01/02/13 ofter renal trransplant failure  . Secondary hyperparathyroidism (of renal origin)   . Shortness of breath   . Sleep apnea   . Tension headache   . Unspecified constipation   . Unspecified essential hypertension   . Unspecified hereditary and idiopathic peripheral neuropathy   . Unspecified vitamin D deficiency     Past Surgical History:  Procedure Laterality Date  . AV FISTULA PLACEMENT Left ?2010   "forearm; at [facility]"  . BACK SURGERY    . CARDIAC CATHETERIZATION  03/21/2011  . CHOLECYSTECTOMY N/A 10/21/2014   Procedure: LAPAROSCOPIC CHOLECYSTECTOMY WITH INTRAOPERATIVE CHOLANGIOGRAM;  Surgeon: Autumn Messing III, MD;  Location: [facility];  Service: General;  Laterality: N/A;  . COLONOSCOPY    . 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 KIDNEY TRANSPLANT  08/17/2011   [place]  10/21/2014   w/IOC  . LEFT HEART CATHETERIZATION WITH CORONARY ANGIOGRAM N/A 03/21/2011   Procedure: LEFT HEART CATHETERIZATION WITH CORONARY ANGIOGRAM;  Surgeon: [name], MD;  Location: [facility] CATH LAB;  Service: Cardiovascular;  Laterality: N/A;  . NEPHRECTOMY  08/2013   removed transplaned kidney  . POSTERIOR FUSION CERVICAL SPINE  06/25/2012    for spinal stenosis  . VASECTOMY  2010    FAMHx:  Family History  Adopted: Yes  Problem Relation Age of Onset  . Colon cancer Neg Hx   . Esophageal cancer Neg Hx   . Rectal cancer Neg Hx   . Stomach cancer Neg Hx     SOCHx:   reports that he has been smoking cigarettes. He has a 16.00 pack-year smoking history. He has never used smokeless tobacco. He reports that he does not drink alcohol or use drugs.  ALLERGIES:  Allergies  Allergen Reactions  . Codeine Nausea And Vomiting    ROS: Pertinent items noted in HPI and remainder of comprehensive ROS otherwise negative.  HOME MEDS: Current Outpatient Medications on File Prior to Visit  Medication Sig Dispense Refill  . acetaminophen (TYLENOL) 500 MG tablet Take 1 tablet (500 mg total) by mouth every 6 (six) hours as needed for moderate pain.    Marland Kitchen  albuterol (PROVENTIL HFA;VENTOLIN HFA) 108 (90 Base) MCG/ACT inhaler Inhale 2 puffs into the lungs every 6 (six) hours as needed for wheezing or shortness of breath. 1 Inhaler 0  . amLODipine (NORVASC) 10 MG tablet Take 1 tablet (10 mg total) by mouth daily. (Patient taking differently: Take 10 mg by mouth at bedtime. ) 30 tablet 11  . aspirin EC 81 MG tablet Take 81 mg by mouth daily.    Marland Kitchen b complex-vitamin c-folic acid (NEPHRO-VITE) 0.8 MG TABS tablet TAKE 1 TABLET BY MOUTH EVERY DAY (Patient taking differently: Take 1 tablet by mouth daily. ) 90 tablet 1  . budesonide-formoterol (SYMBICORT) 160-4.5 MCG/ACT inhaler Inhale 2 puffs into the lungs 2 (two) times daily. 1 Inhaler 0  . calcitRIOL (ROCALTROL) 0.5 MCG capsule Take 2 capsules (1 mcg total) by mouth every Monday, Wednesday, and Friday with hemodialysis. 60 capsule 1  . Cholecalciferol (VITAMIN D3) 5000 UNITS TABS Take 5,000 Units by mouth daily.     . cinacalcet (SENSIPAR) 30 MG tablet Take 90 mg by mouth daily with supper.     . clonazePAM (KLONOPIN) 0.5 MG tablet Take 1 tablet (0.5 mg total) by mouth 2 (two) times daily as needed  for anxiety. 60 tablet 0  . diphenhydrAMINE (BENADRYL) 25 MG tablet Take 50 mg by mouth at bedtime.     Marland Kitchen guaiFENesin (MUCINEX) 600 MG 12 hr tablet Take 1,200 mg by mouth 2 (two) times daily.    . hydrALAZINE (APRESOLINE) 100 MG tablet Take 1 tablet (100 mg total) by mouth 3 (three) times daily. 90 tablet 1  . ipratropium-albuterol (DUONEB) 0.5-2.5 (3) MG/3ML SOLN Take 3 mLs by nebulization every 4 (four) hours as needed (Shortness of breath). 360 mL 0  . lidocaine-prilocaine (EMLA) cream Apply 1 application topically See admin instructions. Apply 1 to 2 hours prior to dialysis on Mondays, Wednesdays, and Fridays. Cover with saran wrap.    . metoprolol tartrate (LOPRESSOR) 100 MG tablet Take 1 tablet (100 mg total) by mouth 2 (two) times daily. 180 tablet 1  . omeprazole (PRILOSEC) 20 MG capsule TAKE 1 CAPSULE TWICE DAILY BEFORE A MEAL (Patient taking differently: Take 20 mg by mouth 2 (two) times daily before a meal. ) 180 capsule 3  . ondansetron (ZOFRAN ODT) 4 MG disintegrating tablet Take 1 tablet (4 mg total) by mouth every 8 (eight) hours as needed for nausea or vomiting. 20 tablet 0  . pravastatin (PRAVACHOL) 40 MG tablet TAKE 1 TABLET EVERY EVENING (Patient taking differently: Take 40 mg by mouth at bedtime. ) 90 tablet 0  . PRESCRIPTION MEDICATION Inhale into the lungs at bedtime. CPAP    . sucroferric oxyhydroxide (VELPHORO) 500 MG chewable tablet Chew 500-1,500 mg by mouth See admin instructions. Chew 3 tablets (1500 mg) by mouth up to three times daily with meals and 1 tablet (500 mg) with snacks    . traMADol (ULTRAM) 50 MG tablet Take 1 tablet (50 mg total) by mouth daily as needed. (Patient taking differently: Take 50 mg by mouth at bedtime as needed for moderate pain. ) 30 tablet 0  . isosorbide mononitrate (IMDUR) 30 MG 24 hr tablet Take 1 tablet (30 mg total) by mouth daily. (Patient taking differently: Take 30 mg by mouth at bedtime. ) 90 tablet 3   No current facility-administered  medications on file prior to visit.    LABS/IMAGING: No results found for this or any previous visit (from the past 48 hour(s)). No results found.  LIPID PANEL:  Component Value Date/Time   CHOL 123 09/25/2018 1545   CHOL 195 06/29/2015 1533   TRIG 151 (H) 09/25/2018 1545   HDL 31 (L) 09/25/2018 1545   HDL 17 (L) 06/29/2015 1533   CHOLHDL 4.0 09/25/2018 1545   VLDL 32 01/28/2018 0242   LDLCALC 69 09/25/2018 1545     WEIGHTS: Wt Readings from Last 3 Encounters:  01/20/19 212 lb (96.2 kg)  01/08/19 209 lb (94.8 kg)  12/04/18 200 lb 13.4 oz (91.1 kg)    VITALS: BP 139/70   Pulse 70   Temp (!) 97.3 F (36.3 C)   Ht 5\' 7"  (1.702 m)   Wt 212 lb (96.2 kg)   SpO2 98%   BMI 33.20 kg/m   EXAM: General appearance: alert and no distress Neck: no carotid bruit, no JVD and thyroid not enlarged, symmetric, no tenderness/mass/nodules Lungs: clear to auscultation bilaterally Heart: regular rate and rhythm, S1, S2 normal, no murmur, click, rub or gallop Abdomen: soft, non-tender; bowel sounds normal; no masses,  no organomegaly Extremities: extremities normal, atraumatic, no cyanosis or edema Pulses: 2+ and symmetric Skin: Skin color, texture, turgor normal. No rashes or lesions Neurologic: Mental status: Alert, oriented, thought content appropriate, Noted hearing loss Psych: Pleasant  EKG: Deferred  ASSESSMENT: 1. Acute systolic congestive heart failure, suspect nonischemic cardiomyopathy-LVEF 40 to 45% (08/2018) 2. Low risk Myoview stress test with reduced LVEF to 38%, global hypokinesis (08/2018) 3. Angiographically normal, large caliber coronary arteries by cath (2013) 4. Uncontrolled hypertension 5. 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 Status post failed renal transplant - [facility] 7. 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   Kirk [name] now has better blood pressure control and seems to have at worst NYHA class II symptoms.   Weight is stable and improved somewhat with dialysis.  LVEF has been about 40 to 45%.  I do not see a reason to repeat that at this time since he is not having any new symptoms.  I would continue to push for good blood pressure control we could consider a repeat echo sometime next year.  [name], MD, [facility], [place] Director of the [facility] Lipid [facility]  Cardi[facility] Diplomate of the American Board of Clinical Lipidology Attending Cardiologist  Direct Dial: [phone]  Fax: [phone]  Website:  [email or web address] 01/20/2019, 3:35 PM

## 2019-01-20 NOTE — Telephone Encounter (Signed)
Patients wife calling to state that she will be coming with her husband to his appt today with Dr. Debara Pickett. He has trouble hearing.

## 2019-01-23 DIAGNOSIS — Z992 Dependence on renal dialysis: Secondary | ICD-10-CM | POA: Diagnosis not present

## 2019-01-23 DIAGNOSIS — T8612 Kidney transplant failure: Secondary | ICD-10-CM | POA: Diagnosis not present

## 2019-01-23 DIAGNOSIS — N186 End stage renal disease: Secondary | ICD-10-CM | POA: Diagnosis not present

## 2019-01-24 DIAGNOSIS — D509 Iron deficiency anemia, unspecified: Secondary | ICD-10-CM | POA: Diagnosis not present

## 2019-01-24 DIAGNOSIS — N186 End stage renal disease: Secondary | ICD-10-CM | POA: Diagnosis not present

## 2019-01-24 DIAGNOSIS — Z992 Dependence on renal dialysis: Secondary | ICD-10-CM | POA: Diagnosis not present

## 2019-01-24 DIAGNOSIS — D631 Anemia in chronic kidney disease: Secondary | ICD-10-CM | POA: Diagnosis not present

## 2019-01-24 DIAGNOSIS — N2581 Secondary hyperparathyroidism of renal origin: Secondary | ICD-10-CM | POA: Diagnosis not present

## 2019-01-26 DIAGNOSIS — D509 Iron deficiency anemia, unspecified: Secondary | ICD-10-CM | POA: Diagnosis not present

## 2019-01-26 DIAGNOSIS — Z992 Dependence on renal dialysis: Secondary | ICD-10-CM | POA: Diagnosis not present

## 2019-01-26 DIAGNOSIS — D631 Anemia in chronic kidney disease: Secondary | ICD-10-CM | POA: Diagnosis not present

## 2019-01-26 DIAGNOSIS — N2581 Secondary hyperparathyroidism of renal origin: Secondary | ICD-10-CM | POA: Diagnosis not present

## 2019-01-26 DIAGNOSIS — N186 End stage renal disease: Secondary | ICD-10-CM | POA: Diagnosis not present

## 2019-01-27 DIAGNOSIS — N2581 Secondary hyperparathyroidism of renal origin: Secondary | ICD-10-CM | POA: Diagnosis not present

## 2019-01-27 DIAGNOSIS — Z992 Dependence on renal dialysis: Secondary | ICD-10-CM | POA: Diagnosis not present

## 2019-01-27 DIAGNOSIS — D631 Anemia in chronic kidney disease: Secondary | ICD-10-CM | POA: Diagnosis not present

## 2019-01-27 DIAGNOSIS — N186 End stage renal disease: Secondary | ICD-10-CM | POA: Diagnosis not present

## 2019-01-27 DIAGNOSIS — D509 Iron deficiency anemia, unspecified: Secondary | ICD-10-CM | POA: Diagnosis not present

## 2019-01-29 ENCOUNTER — Ambulatory Visit: Payer: Medicare Other | Admitting: Internal Medicine

## 2019-01-30 DIAGNOSIS — D509 Iron deficiency anemia, unspecified: Secondary | ICD-10-CM | POA: Diagnosis not present

## 2019-01-30 DIAGNOSIS — N2581 Secondary hyperparathyroidism of renal origin: Secondary | ICD-10-CM | POA: Diagnosis not present

## 2019-01-30 DIAGNOSIS — Z992 Dependence on renal dialysis: Secondary | ICD-10-CM | POA: Diagnosis not present

## 2019-01-30 DIAGNOSIS — N186 End stage renal disease: Secondary | ICD-10-CM | POA: Diagnosis not present

## 2019-01-30 DIAGNOSIS — D631 Anemia in chronic kidney disease: Secondary | ICD-10-CM | POA: Diagnosis not present

## 2019-02-02 DIAGNOSIS — N186 End stage renal disease: Secondary | ICD-10-CM | POA: Diagnosis not present

## 2019-02-02 DIAGNOSIS — N2581 Secondary hyperparathyroidism of renal origin: Secondary | ICD-10-CM | POA: Diagnosis not present

## 2019-02-02 DIAGNOSIS — Z992 Dependence on renal dialysis: Secondary | ICD-10-CM | POA: Diagnosis not present

## 2019-02-02 DIAGNOSIS — D631 Anemia in chronic kidney disease: Secondary | ICD-10-CM | POA: Diagnosis not present

## 2019-02-02 DIAGNOSIS — D509 Iron deficiency anemia, unspecified: Secondary | ICD-10-CM | POA: Diagnosis not present

## 2019-02-03 DIAGNOSIS — D509 Iron deficiency anemia, unspecified: Secondary | ICD-10-CM | POA: Diagnosis not present

## 2019-02-03 DIAGNOSIS — N186 End stage renal disease: Secondary | ICD-10-CM | POA: Diagnosis not present

## 2019-02-03 DIAGNOSIS — D631 Anemia in chronic kidney disease: Secondary | ICD-10-CM | POA: Diagnosis not present

## 2019-02-03 DIAGNOSIS — N2581 Secondary hyperparathyroidism of renal origin: Secondary | ICD-10-CM | POA: Diagnosis not present

## 2019-02-03 DIAGNOSIS — Z992 Dependence on renal dialysis: Secondary | ICD-10-CM | POA: Diagnosis not present

## 2019-02-04 DIAGNOSIS — D631 Anemia in chronic kidney disease: Secondary | ICD-10-CM | POA: Diagnosis not present

## 2019-02-04 DIAGNOSIS — N2581 Secondary hyperparathyroidism of renal origin: Secondary | ICD-10-CM | POA: Diagnosis not present

## 2019-02-04 DIAGNOSIS — N186 End stage renal disease: Secondary | ICD-10-CM | POA: Diagnosis not present

## 2019-02-04 DIAGNOSIS — Z992 Dependence on renal dialysis: Secondary | ICD-10-CM | POA: Diagnosis not present

## 2019-02-04 DIAGNOSIS — D509 Iron deficiency anemia, unspecified: Secondary | ICD-10-CM | POA: Diagnosis not present

## 2019-02-05 ENCOUNTER — Other Ambulatory Visit: Payer: Self-pay

## 2019-02-05 ENCOUNTER — Ambulatory Visit (INDEPENDENT_AMBULATORY_CARE_PROVIDER_SITE_OTHER): Payer: Medicare Other | Admitting: Internal Medicine

## 2019-02-05 ENCOUNTER — Encounter: Payer: Self-pay | Admitting: Internal Medicine

## 2019-02-05 VITALS — BP 138/72 | HR 67 | Temp 97.7°F | Ht 67.0 in | Wt 213.2 lb

## 2019-02-05 DIAGNOSIS — Z992 Dependence on renal dialysis: Secondary | ICD-10-CM

## 2019-02-05 DIAGNOSIS — F419 Anxiety disorder, unspecified: Secondary | ICD-10-CM

## 2019-02-05 DIAGNOSIS — N186 End stage renal disease: Secondary | ICD-10-CM

## 2019-02-05 DIAGNOSIS — E785 Hyperlipidemia, unspecified: Secondary | ICD-10-CM | POA: Diagnosis not present

## 2019-02-05 DIAGNOSIS — Z720 Tobacco use: Secondary | ICD-10-CM | POA: Diagnosis not present

## 2019-02-05 DIAGNOSIS — E1121 Type 2 diabetes mellitus with diabetic nephropathy: Secondary | ICD-10-CM | POA: Diagnosis not present

## 2019-02-05 DIAGNOSIS — J449 Chronic obstructive pulmonary disease, unspecified: Secondary | ICD-10-CM | POA: Diagnosis not present

## 2019-02-05 DIAGNOSIS — M170 Bilateral primary osteoarthritis of knee: Secondary | ICD-10-CM

## 2019-02-05 MED ORDER — TRAMADOL HCL 50 MG PO TABS: 50.0000 mg | ORAL_TABLET | Freq: Every day | ORAL | 0 refills | Status: DC | PRN

## 2019-02-05 MED ORDER — PRAVASTATIN SODIUM 40 MG PO TABS: 40.0000 mg | ORAL_TABLET | Freq: Every evening | ORAL | 0 refills | Status: DC

## 2019-02-05 MED ORDER — CLONAZEPAM 0.5 MG PO TABS: 0.5000 mg | ORAL_TABLET | Freq: Two times a day (BID) | ORAL | 0 refills | Status: DC | PRN

## 2019-02-05 NOTE — Patient Instructions (Addendum)
Follow-up with orthopedics about the knee injections and other cartilage injections.  Please quit smoking!    I think your fatigue is a combination of your dialysis and grieving.  COVID-19 Vaccine Information can be found at: [email or web address].uk For questions related to vaccine distribution or appointments, please email [email or web address] or call [phone].   Use the phone number to call for an appointment when you are eligible.

## 2019-02-05 NOTE — Progress Notes (Signed)
Location:  [facility] clinic  Provider: Dr. Hollace Kinnier   Goals of Care:  Advanced Directives 02/05/2019  Does Patient Have a Medical Advance Directive? No  Does patient want to make changes to medical advance directive? -  Would patient like information on creating a medical advance directive? Yes (MAU/Ambulatory/Procedural Areas - Information given)  Pre-existing out of facility DNR order (yellow form or pink MOST form) -     Chief Complaint  Patient presents with  . Medication Management    4 month follow up for medical management     HPI: Patient is a 59 y.o. male seen today for medical management of chronic diseases.    Wife present during visit.   Mother in law passed in October 2020. Her passing was unexpected. They are still grieving her loss. They have also been stressed dissolving her property.   Left knee was injected in September 2020 by Dr. Erlinda Hong. He had relief for about 2 weeks and then pain returned. He is still taking his tylenol and Tramadol daily. Asking for Tramadol refill. Wears his knee brace daily. Has not tried any other interventions like heat or ice. No recent injuries for falls reported.   Still attending dialysis three times a week. He is very tired after dialysis. Drives himself to his treatments most of the time. Wife concerned with his memory. States he will get lost going home after his treatments.   Taking medications as prescribed. Diet has been poor due to death in the family. Still follows a low sodium diet. Admitted to drinking too much water, knows he needs to watch it due to his heart and kidneys.   Breathing is improved since seeing Dr. Melvyn Novas. Still taking symbicort twice a day. Only uses rescue inhaler when needed, has not used it in last week.   Fatigue is his main concern. He feels tired all the time. Does not know if it is associated to his dislysis three times a week.  He is taking vitamin B12 daily. Has a history of OSA and using CPAP every  night. Sleeping varies per night. Claims to have restless nights a few times a week.   Had quit smoking for a few weeks, but then started back. He is currently smoking about 5 cigarettes daily.       Past Medical History:  Diagnosis Date  . Acute edema of lung, unspecified   . Acute, but ill-defined, cerebrovascular disease   . Allergy   . Anemia   . 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)   . Anxiety   . Asthma   . Asthma    moderate persistent  . Carpal tunnel syndrome   . Cellulitis and abscess of trunk   . Cholelithiasis 07/13/2014  . Chronic headaches   . Debility, unspecified   . Dermatophytosis of the body   . Dysrhythmia    history of  . Edema   . End stage renal disease on dialysis [facility])    "MWF; [facility]" (10/21/2014)  . Essential hypertension, benign   . GERD (gastroesophageal reflux disease)   . Gout, unspecified   . HTN (hypertension)   . Hypertrophy of prostate without urinary obstruction and other lower urinary tract symptoms (LUTS)   . Hypotension, unspecified   . Impotence of organic origin   . Insomnia, unspecified   . Kidney replaced by transplant   . Localization-related (focal) (partial) epilepsy and epileptic syndromes with complex partial seizures, without mention of intractable epilepsy   .  Lumbago   . Memory loss   . OSA on CPAP   . Other and unspecified hyperlipidemia   . Other chronic nonalcoholic liver disease   . Other malaise and fatigue   . Other nonspecific abnormal serum enzyme levels   . Pain in joint, lower leg   . Pain in joint, upper arm   . Pneumonia "several times"  . Renal dialysis status(V45.11) 02/05/2010   restarted 01/02/13 ofter renal trransplant failure  . Secondary hyperparathyroidism (of renal origin)   . Shortness of breath   . Sleep apnea   . Tension headache   . Unspecified constipation   . Unspecified essential hypertension   . Unspecified hereditary and idiopathic peripheral neuropathy     . Unspecified vitamin D deficiency     Past Surgical History:  Procedure Laterality Date  . AV FISTULA PLACEMENT Left ?2010   "forearm; at [facility]"  . BACK SURGERY    . CARDIAC CATHETERIZATION  03/21/2011  . CHOLECYSTECTOMY N/A 10/21/2014   Procedure: LAPAROSCOPIC CHOLECYSTECTOMY WITH INTRAOPERATIVE CHOLANGIOGRAM;  Surgeon: Autumn Messing III, MD;  Location: [place];  Service: General;  Laterality: N/A;  . COLONOSCOPY    . 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 KIDNEY TRANSPLANT  08/17/2011   [place]  10/21/2014   w/IOC  . LEFT HEART CATHETERIZATION WITH CORONARY ANGIOGRAM N/A 03/21/2011   Procedure: LEFT HEART CATHETERIZATION WITH CORONARY ANGIOGRAM;  Surgeon: [name], MD;  Location: [facility] CATH LAB;  Service: Cardiovascular;  Laterality: N/A;  . NEPHRECTOMY  08/2013   removed transplaned kidney  . POSTERIOR FUSION CERVICAL SPINE  06/25/2012   for spinal stenosis  . VASECTOMY  2010    Allergies  Allergen Reactions  . Codeine Nausea And Vomiting    Outpatient Encounter Medications as of 02/05/2019  Medication Sig  . acetaminophen (TYLENOL) 500 MG tablet Take 1 tablet (500 mg total) by mouth every 6 (six) hours as needed for moderate pain.  Marland Kitchen albuterol (PROVENTIL HFA;VENTOLIN HFA) 108 (90 Base) MCG/ACT inhaler Inhale 2 puffs into the lungs every 6 (six) hours as needed for wheezing or shortness of breath.  Marland Kitchen amLODipine (NORVASC) 10 MG tablet Take 1 tablet (10 mg total) by mouth daily.  Marland Kitchen aspirin EC 81 MG tablet Take 81 mg by mouth daily.  Marland Kitchen b complex-vitamin c-folic acid (NEPHRO-VITE) 0.8 MG TABS tablet TAKE 1 TABLET BY MOUTH EVERY DAY  . budesonide-formoterol (SYMBICORT) 160-4.5 MCG/ACT inhaler Inhale 2 puffs into the lungs 2 (two) times daily.  . calcitRIOL (ROCALTROL) 0.5 MCG capsule Take 2 capsules (1 mcg total) by mouth every Monday, Wednesday, and Friday with hemodialysis.  . Cholecalciferol (VITAMIN D3) 5000  UNITS TABS Take 5,000 Units by mouth daily.   . cinacalcet (SENSIPAR) 30 MG tablet Take 90 mg by mouth daily with supper.   . clonazePAM (KLONOPIN) 0.5 MG tablet Take 1 tablet (0.5 mg total) by mouth 2 (two) times daily as needed for anxiety.  . diphenhydrAMINE (BENADRYL) 25 MG tablet Take 50 mg by mouth at bedtime.   Marland Kitchen guaiFENesin (MUCINEX) 600 MG 12 hr tablet Take 1,200 mg by mouth 2 (two) times daily.  . hydrALAZINE (APRESOLINE) 100 MG tablet Take 1 tablet (100 mg total) by mouth 3 (three) times daily.  Marland Kitchen ipratropium-albuterol (DUONEB) 0.5-2.5 (3) MG/3ML SOLN Take 3 mLs by nebulization every 4 (four) hours as needed (Shortness of breath).  . lidocaine-prilocaine (EMLA) cream Apply 1 application topically See  admin instructions. Apply 1 to 2 hours prior to dialysis on Mondays, Wednesdays, and Fridays. Cover with saran wrap.  . metoprolol tartrate (LOPRESSOR) 100 MG tablet Take 1 tablet (100 mg total) by mouth 2 (two) times daily.  Marland Kitchen omeprazole (PRILOSEC) 20 MG capsule TAKE 1 CAPSULE TWICE DAILY BEFORE A MEAL  . ondansetron (ZOFRAN ODT) 4 MG disintegrating tablet Take 1 tablet (4 mg total) by mouth every 8 (eight) hours as needed for nausea or vomiting.  . pravastatin (PRAVACHOL) 40 MG tablet TAKE 1 TABLET EVERY EVENING  . PRESCRIPTION MEDICATION Inhale into the lungs at bedtime. CPAP  . sucroferric oxyhydroxide (VELPHORO) 500 MG chewable tablet Chew 500-1,500 mg by mouth See admin instructions. Chew 3 tablets (1500 mg) by mouth up to three times daily with meals and 1 tablet (500 mg) with snacks  . traMADol (ULTRAM) 50 MG tablet Take 1 tablet (50 mg total) by mouth daily as needed.  . isosorbide mononitrate (IMDUR) 30 MG 24 hr tablet Take 1 tablet (30 mg total) by mouth daily.   No facility-administered encounter medications on file as of 02/05/2019.    Review of Systems:  Review of Systems  Constitutional: Positive for fatigue. Negative for activity change and appetite change.  HENT:  Positive for dental problem. Negative for hearing loss and trouble swallowing.        Missing teeth  Eyes: Negative for photophobia and visual disturbance.       Wears glasses  Respiratory: Positive for shortness of breath. Negative for cough and wheezing.   Cardiovascular: Negative for chest pain and leg swelling.  Gastrointestinal: Negative for abdominal pain, constipation, diarrhea and nausea.  Endocrine: Negative for polydipsia, polyphagia and polyuria.  Genitourinary: Positive for dysuria and hematuria.  Musculoskeletal:       Left and right knee pain  Skin: Negative.   Neurological: Negative for dizziness, tremors, weakness and headaches.  Psychiatric/Behavioral: Positive for dysphoric mood and sleep disturbance. The patient is not nervous/anxious.     Health Maintenance  Topic Date Due  . OPHTHALMOLOGY EXAM  01/29/1972  . FOOT EXAM  04/29/2014  . INFLUENZA VACCINE  04/22/2019 (Originally 08/23/2018)  . TETANUS/TDAP  10/09/2019 (Originally 01/23/2016)  . HEMOGLOBIN A1C  03/25/2019  . COLONOSCOPY  08/30/2019  . PNEUMOCOCCAL POLYSACCHARIDE VACCINE AGE 3-64 HIGH RISK  Completed  . Hepatitis C Screening  Completed  . HIV Screening  Completed    Physical Exam: Vitals:   02/05/19 1434  BP: 138/72  Pulse: 67  Temp: 97.7 F (36.5 C)  SpO2: 97%  Weight: 213 lb 3.2 oz (96.7 kg)  Height: 5\' 7"  (1.702 m)   Body mass index is 33.39 kg/m. Physical Exam Constitutional:      Appearance: Normal appearance. He is normal weight.  Cardiovascular:     Rate and Rhythm: Normal rate and regular rhythm.     Pulses: Normal pulses.     Heart sounds: Normal heart sounds. No murmur.  Pulmonary:     Effort: Pulmonary effort is normal.     Breath sounds: Rales present.  Abdominal:     General: Abdomen is flat. Bowel sounds are normal.     Palpations: Abdomen is soft.  Musculoskeletal:     Left knee: Swelling and bony tenderness present. No crepitus. Tenderness present over the medial joint  line.     Right lower leg: No edema.     Left lower leg: No edema.  Skin:    General: Skin is warm and dry.  Capillary Refill: Capillary refill takes less than 2 seconds.  Neurological:     General: No focal deficit present.     Mental Status: He is alert and oriented to person, place, and time. Mental status is at baseline.  Psychiatric:        Mood and Affect: Mood normal.        Behavior: Behavior normal.        Thought Content: Thought content normal.        Judgment: Judgment normal.     Labs reviewed: Basic Metabolic Panel: Recent Labs    08/13/18 0613 09/17/18 1218 09/18/18 0459 09/19/18 0403 12/02/18 2031 12/02/18 2031 12/02/18 2052 12/03/18 0333 12/03/18 1910  NA 135   < > 138   < > 140   < > 140 139 136  K 3.6   < > 4.3   < > 4.4   < > 4.1 3.9 4.1  CL 96*   < > 97*   < > 98  --   --  98 95*  CO2 23   < > 28   < > 23  --   --  24 23  GLUCOSE 121*   < > 115*   < > 129*  --   --  181* 112*  BUN 46*   < > 33*   < > 48*  --   --  53* 29*  CREATININE 13.97*   < > 9.63*   < > 12.83*  --   --  13.84* 9.13*  CALCIUM 8.7*   < > 8.6*   < > 9.0  --   --  8.6* 9.2  PHOS 6.6*  --  6.3*  --   --   --   --   --  5.4*   < > = values in this interval not displayed.   Liver Function Tests: Recent Labs    08/12/18 1644 08/13/18 0613 12/02/18 2031 12/03/18 0333 12/03/18 1910  AST 27  --  20 16  --   ALT 35  --  16 14  --   ALKPHOS 87  --  91 81  --   BILITOT 0.8  --  0.9 0.5  --   PROT 6.8  --  7.6 6.8  --   ALBUMIN 3.6   < > 4.0 3.4* 4.0   < > = values in this interval not displayed.   No results for input(s): LIPASE, AMYLASE in the last 8760 hours. Recent Labs    08/12/18 1644  AMMONIA 34   CBC: Recent Labs    06/01/18 2030 06/02/18 0726 09/19/18 0403 09/19/18 0403 12/02/18 2031 12/02/18 2031 12/02/18 2052 12/03/18 0333 12/03/18 1910  WBC 5.1   < > 3.7*   < > 6.9  --   --  6.0 6.3  NEUTROABS 3.5  --  2.4  --  5.5  --   --   --   --   HGB 10.0*    < > 8.9*   < > 13.8   < > 13.9 11.9* 13.6  HCT 31.9*   < > 28.0*   < > 44.1   < > 41.0 37.8* 43.4  MCV 100.3*   < > 101.8*   < > 95.5  --   --  95.7 94.3  PLT 101*   < > 101*   < > 98*  --   --  89* 86*   < > = values in this interval  not displayed.   Lipid Panel: Recent Labs    09/25/18 1545  CHOL 123  HDL 31*  LDLCALC 69  TRIG 151*  CHOLHDL 4.0   Lab Results  Component Value Date   HGBA1C 5.4 09/25/2018    Procedures since last visit: No results found.  Assessment/Plan 1. Anxiety - stable at this time - he denies increased anxiety after loved one passing - clonazePAM (KLONOPIN) 0.5 MG tablet; Take 1 tablet (0.5 mg total) by mouth 2 (two) times daily as needed for anxiety.  Dispense: 60 tablet; Refill: 0  2. Bilateral primary osteoarthritis of knee - left knee pain has not subsided - recommend f/u with Dr. Erlinda Hong at [place] - continue current tylenol and Tramadol regimen - may ice knee 20 minutes, four times daily - continue to wear knee brace - traMADol (ULTRAM) 50 MG tablet; Take 1 tablet (50 mg total) by mouth daily as needed.  Dispense: 30 tablet; Refill: 0  3. HLD (hyperlipidemia) - continue current statin regimen - lipid panel- future - pravastatin (PRAVACHOL) 40 MG tablet; Take 1 tablet (40 mg total) by mouth every evening.  Dispense: 90 tablet; Refill: 0  4. Type 2 diabetes mellitus with diabetic nephropathy, without long-term current use of insulin (HCC) - hemoglobin A1C- future - due for diabetic eye exam  5. ESRD on dialysis [facility]) - m/w/f - recommend family member driving him to appointments, especially when fatugued  6. Chronic obstructive pulmonary disease, unspecified COPD type ([place]) - followed by Dr. Melvyn Novas - stable at this time, has not had to use rescue inhaler daily - continue symbicort twice a day  7. Tobacco abuse - he continues to smoke cigarettes - has tried to quit many times without success - educated about the dangers of  smoking and recommend quitting  Labs/tests ordered:  Lipid panel, hemoglobin A1C Next appt:  4 month follow up

## 2019-02-06 DIAGNOSIS — N2581 Secondary hyperparathyroidism of renal origin: Secondary | ICD-10-CM | POA: Diagnosis not present

## 2019-02-06 DIAGNOSIS — N186 End stage renal disease: Secondary | ICD-10-CM | POA: Diagnosis not present

## 2019-02-06 DIAGNOSIS — D509 Iron deficiency anemia, unspecified: Secondary | ICD-10-CM | POA: Diagnosis not present

## 2019-02-06 DIAGNOSIS — D631 Anemia in chronic kidney disease: Secondary | ICD-10-CM | POA: Diagnosis not present

## 2019-02-06 DIAGNOSIS — Z992 Dependence on renal dialysis: Secondary | ICD-10-CM | POA: Diagnosis not present

## 2019-02-09 DIAGNOSIS — D631 Anemia in chronic kidney disease: Secondary | ICD-10-CM | POA: Diagnosis not present

## 2019-02-09 DIAGNOSIS — N2581 Secondary hyperparathyroidism of renal origin: Secondary | ICD-10-CM | POA: Diagnosis not present

## 2019-02-09 DIAGNOSIS — Z992 Dependence on renal dialysis: Secondary | ICD-10-CM | POA: Diagnosis not present

## 2019-02-09 DIAGNOSIS — D509 Iron deficiency anemia, unspecified: Secondary | ICD-10-CM | POA: Diagnosis not present

## 2019-02-09 DIAGNOSIS — N186 End stage renal disease: Secondary | ICD-10-CM | POA: Diagnosis not present

## 2019-02-11 DIAGNOSIS — D509 Iron deficiency anemia, unspecified: Secondary | ICD-10-CM | POA: Diagnosis not present

## 2019-02-11 DIAGNOSIS — N2581 Secondary hyperparathyroidism of renal origin: Secondary | ICD-10-CM | POA: Diagnosis not present

## 2019-02-11 DIAGNOSIS — N186 End stage renal disease: Secondary | ICD-10-CM | POA: Diagnosis not present

## 2019-02-11 DIAGNOSIS — Z992 Dependence on renal dialysis: Secondary | ICD-10-CM | POA: Diagnosis not present

## 2019-02-11 DIAGNOSIS — D631 Anemia in chronic kidney disease: Secondary | ICD-10-CM | POA: Diagnosis not present

## 2019-02-13 DIAGNOSIS — D509 Iron deficiency anemia, unspecified: Secondary | ICD-10-CM | POA: Diagnosis not present

## 2019-02-13 DIAGNOSIS — D631 Anemia in chronic kidney disease: Secondary | ICD-10-CM | POA: Diagnosis not present

## 2019-02-13 DIAGNOSIS — Z992 Dependence on renal dialysis: Secondary | ICD-10-CM | POA: Diagnosis not present

## 2019-02-13 DIAGNOSIS — N186 End stage renal disease: Secondary | ICD-10-CM | POA: Diagnosis not present

## 2019-02-13 DIAGNOSIS — N2581 Secondary hyperparathyroidism of renal origin: Secondary | ICD-10-CM | POA: Diagnosis not present

## 2019-02-16 DIAGNOSIS — D509 Iron deficiency anemia, unspecified: Secondary | ICD-10-CM | POA: Diagnosis not present

## 2019-02-16 DIAGNOSIS — N2581 Secondary hyperparathyroidism of renal origin: Secondary | ICD-10-CM | POA: Diagnosis not present

## 2019-02-16 DIAGNOSIS — D631 Anemia in chronic kidney disease: Secondary | ICD-10-CM | POA: Diagnosis not present

## 2019-02-16 DIAGNOSIS — Z992 Dependence on renal dialysis: Secondary | ICD-10-CM | POA: Diagnosis not present

## 2019-02-16 DIAGNOSIS — N186 End stage renal disease: Secondary | ICD-10-CM | POA: Diagnosis not present

## 2019-02-18 DIAGNOSIS — D509 Iron deficiency anemia, unspecified: Secondary | ICD-10-CM | POA: Diagnosis not present

## 2019-02-18 DIAGNOSIS — D631 Anemia in chronic kidney disease: Secondary | ICD-10-CM | POA: Diagnosis not present

## 2019-02-18 DIAGNOSIS — N2581 Secondary hyperparathyroidism of renal origin: Secondary | ICD-10-CM | POA: Diagnosis not present

## 2019-02-18 DIAGNOSIS — Z992 Dependence on renal dialysis: Secondary | ICD-10-CM | POA: Diagnosis not present

## 2019-02-18 DIAGNOSIS — N186 End stage renal disease: Secondary | ICD-10-CM | POA: Diagnosis not present

## 2019-02-20 DIAGNOSIS — N186 End stage renal disease: Secondary | ICD-10-CM | POA: Diagnosis not present

## 2019-02-20 DIAGNOSIS — D631 Anemia in chronic kidney disease: Secondary | ICD-10-CM | POA: Diagnosis not present

## 2019-02-20 DIAGNOSIS — N2581 Secondary hyperparathyroidism of renal origin: Secondary | ICD-10-CM | POA: Diagnosis not present

## 2019-02-20 DIAGNOSIS — Z992 Dependence on renal dialysis: Secondary | ICD-10-CM | POA: Diagnosis not present

## 2019-02-20 DIAGNOSIS — D509 Iron deficiency anemia, unspecified: Secondary | ICD-10-CM | POA: Diagnosis not present

## 2019-02-23 DIAGNOSIS — D631 Anemia in chronic kidney disease: Secondary | ICD-10-CM | POA: Diagnosis not present

## 2019-02-23 DIAGNOSIS — Z23 Encounter for immunization: Secondary | ICD-10-CM | POA: Diagnosis not present

## 2019-02-23 DIAGNOSIS — T8612 Kidney transplant failure: Secondary | ICD-10-CM | POA: Diagnosis not present

## 2019-02-23 DIAGNOSIS — Z992 Dependence on renal dialysis: Secondary | ICD-10-CM | POA: Diagnosis not present

## 2019-02-23 DIAGNOSIS — D509 Iron deficiency anemia, unspecified: Secondary | ICD-10-CM | POA: Diagnosis not present

## 2019-02-23 DIAGNOSIS — N2581 Secondary hyperparathyroidism of renal origin: Secondary | ICD-10-CM | POA: Diagnosis not present

## 2019-02-23 DIAGNOSIS — N186 End stage renal disease: Secondary | ICD-10-CM | POA: Diagnosis not present

## 2019-02-25 DIAGNOSIS — Z23 Encounter for immunization: Secondary | ICD-10-CM | POA: Diagnosis not present

## 2019-02-25 DIAGNOSIS — D509 Iron deficiency anemia, unspecified: Secondary | ICD-10-CM | POA: Diagnosis not present

## 2019-02-25 DIAGNOSIS — N2581 Secondary hyperparathyroidism of renal origin: Secondary | ICD-10-CM | POA: Diagnosis not present

## 2019-02-25 DIAGNOSIS — D631 Anemia in chronic kidney disease: Secondary | ICD-10-CM | POA: Diagnosis not present

## 2019-02-25 DIAGNOSIS — Z992 Dependence on renal dialysis: Secondary | ICD-10-CM | POA: Diagnosis not present

## 2019-02-25 DIAGNOSIS — N186 End stage renal disease: Secondary | ICD-10-CM | POA: Diagnosis not present

## 2019-02-27 DIAGNOSIS — Z992 Dependence on renal dialysis: Secondary | ICD-10-CM | POA: Diagnosis not present

## 2019-02-27 DIAGNOSIS — D509 Iron deficiency anemia, unspecified: Secondary | ICD-10-CM | POA: Diagnosis not present

## 2019-02-27 DIAGNOSIS — Z23 Encounter for immunization: Secondary | ICD-10-CM | POA: Diagnosis not present

## 2019-02-27 DIAGNOSIS — D631 Anemia in chronic kidney disease: Secondary | ICD-10-CM | POA: Diagnosis not present

## 2019-02-27 DIAGNOSIS — N186 End stage renal disease: Secondary | ICD-10-CM | POA: Diagnosis not present

## 2019-02-27 DIAGNOSIS — N2581 Secondary hyperparathyroidism of renal origin: Secondary | ICD-10-CM | POA: Diagnosis not present

## 2019-03-02 DIAGNOSIS — N2581 Secondary hyperparathyroidism of renal origin: Secondary | ICD-10-CM | POA: Diagnosis not present

## 2019-03-02 DIAGNOSIS — Z23 Encounter for immunization: Secondary | ICD-10-CM | POA: Diagnosis not present

## 2019-03-02 DIAGNOSIS — D509 Iron deficiency anemia, unspecified: Secondary | ICD-10-CM | POA: Diagnosis not present

## 2019-03-02 DIAGNOSIS — D631 Anemia in chronic kidney disease: Secondary | ICD-10-CM | POA: Diagnosis not present

## 2019-03-02 DIAGNOSIS — Z992 Dependence on renal dialysis: Secondary | ICD-10-CM | POA: Diagnosis not present

## 2019-03-02 DIAGNOSIS — N186 End stage renal disease: Secondary | ICD-10-CM | POA: Diagnosis not present

## 2019-03-04 DIAGNOSIS — Z23 Encounter for immunization: Secondary | ICD-10-CM | POA: Diagnosis not present

## 2019-03-04 DIAGNOSIS — N186 End stage renal disease: Secondary | ICD-10-CM | POA: Diagnosis not present

## 2019-03-04 DIAGNOSIS — D509 Iron deficiency anemia, unspecified: Secondary | ICD-10-CM | POA: Diagnosis not present

## 2019-03-04 DIAGNOSIS — D631 Anemia in chronic kidney disease: Secondary | ICD-10-CM | POA: Diagnosis not present

## 2019-03-04 DIAGNOSIS — N2581 Secondary hyperparathyroidism of renal origin: Secondary | ICD-10-CM | POA: Diagnosis not present

## 2019-03-04 DIAGNOSIS — Z992 Dependence on renal dialysis: Secondary | ICD-10-CM | POA: Diagnosis not present

## 2019-03-05 DIAGNOSIS — I871 Compression of vein: Secondary | ICD-10-CM | POA: Diagnosis not present

## 2019-03-05 DIAGNOSIS — N186 End stage renal disease: Secondary | ICD-10-CM | POA: Diagnosis not present

## 2019-03-05 DIAGNOSIS — Z992 Dependence on renal dialysis: Secondary | ICD-10-CM | POA: Diagnosis not present

## 2019-03-06 DIAGNOSIS — Z23 Encounter for immunization: Secondary | ICD-10-CM | POA: Diagnosis not present

## 2019-03-06 DIAGNOSIS — N2581 Secondary hyperparathyroidism of renal origin: Secondary | ICD-10-CM | POA: Diagnosis not present

## 2019-03-06 DIAGNOSIS — N186 End stage renal disease: Secondary | ICD-10-CM | POA: Diagnosis not present

## 2019-03-06 DIAGNOSIS — D509 Iron deficiency anemia, unspecified: Secondary | ICD-10-CM | POA: Diagnosis not present

## 2019-03-06 DIAGNOSIS — Z992 Dependence on renal dialysis: Secondary | ICD-10-CM | POA: Diagnosis not present

## 2019-03-06 DIAGNOSIS — D631 Anemia in chronic kidney disease: Secondary | ICD-10-CM | POA: Diagnosis not present

## 2019-03-09 DIAGNOSIS — Z23 Encounter for immunization: Secondary | ICD-10-CM | POA: Diagnosis not present

## 2019-03-09 DIAGNOSIS — N2581 Secondary hyperparathyroidism of renal origin: Secondary | ICD-10-CM | POA: Diagnosis not present

## 2019-03-09 DIAGNOSIS — Z992 Dependence on renal dialysis: Secondary | ICD-10-CM | POA: Diagnosis not present

## 2019-03-09 DIAGNOSIS — D509 Iron deficiency anemia, unspecified: Secondary | ICD-10-CM | POA: Diagnosis not present

## 2019-03-09 DIAGNOSIS — D631 Anemia in chronic kidney disease: Secondary | ICD-10-CM | POA: Diagnosis not present

## 2019-03-09 DIAGNOSIS — N186 End stage renal disease: Secondary | ICD-10-CM | POA: Diagnosis not present

## 2019-03-11 DIAGNOSIS — D631 Anemia in chronic kidney disease: Secondary | ICD-10-CM | POA: Diagnosis not present

## 2019-03-11 DIAGNOSIS — Z23 Encounter for immunization: Secondary | ICD-10-CM | POA: Diagnosis not present

## 2019-03-11 DIAGNOSIS — N2581 Secondary hyperparathyroidism of renal origin: Secondary | ICD-10-CM | POA: Diagnosis not present

## 2019-03-11 DIAGNOSIS — D509 Iron deficiency anemia, unspecified: Secondary | ICD-10-CM | POA: Diagnosis not present

## 2019-03-11 DIAGNOSIS — N186 End stage renal disease: Secondary | ICD-10-CM | POA: Diagnosis not present

## 2019-03-11 DIAGNOSIS — Z992 Dependence on renal dialysis: Secondary | ICD-10-CM | POA: Diagnosis not present

## 2019-03-14 DIAGNOSIS — Z992 Dependence on renal dialysis: Secondary | ICD-10-CM | POA: Diagnosis not present

## 2019-03-14 DIAGNOSIS — Z23 Encounter for immunization: Secondary | ICD-10-CM | POA: Diagnosis not present

## 2019-03-14 DIAGNOSIS — D509 Iron deficiency anemia, unspecified: Secondary | ICD-10-CM | POA: Diagnosis not present

## 2019-03-14 DIAGNOSIS — D631 Anemia in chronic kidney disease: Secondary | ICD-10-CM | POA: Diagnosis not present

## 2019-03-14 DIAGNOSIS — N186 End stage renal disease: Secondary | ICD-10-CM | POA: Diagnosis not present

## 2019-03-14 DIAGNOSIS — N2581 Secondary hyperparathyroidism of renal origin: Secondary | ICD-10-CM | POA: Diagnosis not present

## 2019-03-16 DIAGNOSIS — N2581 Secondary hyperparathyroidism of renal origin: Secondary | ICD-10-CM | POA: Diagnosis not present

## 2019-03-16 DIAGNOSIS — D631 Anemia in chronic kidney disease: Secondary | ICD-10-CM | POA: Diagnosis not present

## 2019-03-16 DIAGNOSIS — D509 Iron deficiency anemia, unspecified: Secondary | ICD-10-CM | POA: Diagnosis not present

## 2019-03-16 DIAGNOSIS — Z23 Encounter for immunization: Secondary | ICD-10-CM | POA: Diagnosis not present

## 2019-03-16 DIAGNOSIS — N186 End stage renal disease: Secondary | ICD-10-CM | POA: Diagnosis not present

## 2019-03-16 DIAGNOSIS — Z992 Dependence on renal dialysis: Secondary | ICD-10-CM | POA: Diagnosis not present

## 2019-03-18 DIAGNOSIS — D631 Anemia in chronic kidney disease: Secondary | ICD-10-CM | POA: Diagnosis not present

## 2019-03-18 DIAGNOSIS — D509 Iron deficiency anemia, unspecified: Secondary | ICD-10-CM | POA: Diagnosis not present

## 2019-03-18 DIAGNOSIS — Z992 Dependence on renal dialysis: Secondary | ICD-10-CM | POA: Diagnosis not present

## 2019-03-18 DIAGNOSIS — Z23 Encounter for immunization: Secondary | ICD-10-CM | POA: Diagnosis not present

## 2019-03-18 DIAGNOSIS — N186 End stage renal disease: Secondary | ICD-10-CM | POA: Diagnosis not present

## 2019-03-18 DIAGNOSIS — N2581 Secondary hyperparathyroidism of renal origin: Secondary | ICD-10-CM | POA: Diagnosis not present

## 2019-03-20 DIAGNOSIS — N186 End stage renal disease: Secondary | ICD-10-CM | POA: Diagnosis not present

## 2019-03-20 DIAGNOSIS — N2581 Secondary hyperparathyroidism of renal origin: Secondary | ICD-10-CM | POA: Diagnosis not present

## 2019-03-20 DIAGNOSIS — Z23 Encounter for immunization: Secondary | ICD-10-CM | POA: Diagnosis not present

## 2019-03-20 DIAGNOSIS — Z992 Dependence on renal dialysis: Secondary | ICD-10-CM | POA: Diagnosis not present

## 2019-03-20 DIAGNOSIS — D509 Iron deficiency anemia, unspecified: Secondary | ICD-10-CM | POA: Diagnosis not present

## 2019-03-20 DIAGNOSIS — D631 Anemia in chronic kidney disease: Secondary | ICD-10-CM | POA: Diagnosis not present

## 2019-03-23 DIAGNOSIS — T8612 Kidney transplant failure: Secondary | ICD-10-CM | POA: Diagnosis not present

## 2019-03-23 DIAGNOSIS — Z992 Dependence on renal dialysis: Secondary | ICD-10-CM | POA: Diagnosis not present

## 2019-03-23 DIAGNOSIS — N186 End stage renal disease: Secondary | ICD-10-CM | POA: Diagnosis not present

## 2019-03-30 ENCOUNTER — Telehealth: Payer: Self-pay | Admitting: Internal Medicine

## 2019-03-30 NOTE — Telephone Encounter (Signed)
Pt c/o swelling: STAT is pt has developed SOB within 24 hours  1) How much weight have you gained and in what time span 2)  no  3) If swelling, where is the swelling located?  no  4) Are you currently taking a fluid pill?  no  5) Are you currently SOB? Sometimes, real tired and no energy  6) Do you have a log of your daily weights (if so, list)?no  7) Have you gained 3 pounds in a day or 5 pounds in a week?              no  8) Have you traveled recently?no-  Pt has an appt virtually on 04-02-19 with Dr Debara Pickett

## 2019-03-30 NOTE — Telephone Encounter (Signed)
Returned the call to the patient. He was taking a nap so spoke with the wife instead. She stated that the patient has been more tired lately and has been having shortness of breath on exertion. She stated that he has only gained 2-3 pounds in a month and does not having any edema.   He is on dialysis and she feels like they are pulling too much fluid from him.  She stated that his blood pressures are good but could not give any specific readings.  He has a virtuall appointment on 3/11 with Dr. Debara Pickett. She has been advised to call back if she feels like he is getting worse.

## 2019-04-02 ENCOUNTER — Encounter: Payer: Self-pay | Admitting: Internal Medicine

## 2019-04-02 ENCOUNTER — Telehealth (INDEPENDENT_AMBULATORY_CARE_PROVIDER_SITE_OTHER): Payer: Medicare Other | Admitting: Internal Medicine

## 2019-04-02 VITALS — BP 98/70 | Wt 215.0 lb

## 2019-04-02 DIAGNOSIS — N186 End stage renal disease: Secondary | ICD-10-CM | POA: Diagnosis not present

## 2019-04-02 DIAGNOSIS — E8770 Fluid overload, unspecified: Secondary | ICD-10-CM | POA: Diagnosis not present

## 2019-04-02 DIAGNOSIS — I5022 Chronic systolic (congestive) heart failure: Secondary | ICD-10-CM | POA: Diagnosis not present

## 2019-04-02 DIAGNOSIS — Z992 Dependence on renal dialysis: Secondary | ICD-10-CM

## 2019-04-02 DIAGNOSIS — Q8781 Alport syndrome: Secondary | ICD-10-CM | POA: Diagnosis not present

## 2019-04-02 DIAGNOSIS — I5021 Acute systolic (congestive) heart failure: Secondary | ICD-10-CM

## 2019-04-02 DIAGNOSIS — I10 Essential (primary) hypertension: Secondary | ICD-10-CM | POA: Diagnosis not present

## 2019-04-02 DIAGNOSIS — N2581 Secondary hyperparathyroidism of renal origin: Secondary | ICD-10-CM | POA: Diagnosis not present

## 2019-04-02 DIAGNOSIS — I428 Other cardiomyopathies: Secondary | ICD-10-CM

## 2019-04-02 DIAGNOSIS — I480 Paroxysmal atrial fibrillation: Secondary | ICD-10-CM

## 2019-04-02 NOTE — Patient Instructions (Signed)
Medication Instructions:  DECREASE hydralazine to 50mg  twice daily Continue other current medications  *If you need a refill on your cardiac medications before your next appointment, please call your pharmacy*  Follow-Up: At [facility], you and your health needs are our priority.  As part of our continuing mission to provide you with exceptional heart care, we have created designated Provider Care Teams.  These Care Teams include your primary Cardiologist (physician) and Advanced Practice Providers (APPs -  Physician Assistants and Nurse Practitioners) who all work together to provide you with the care you need, when you need it.  We recommend signing up for the patient portal called "MyChart".  Sign up information is provided on this After Visit Summary.  MyChart is used to connect with patients for Virtual Visits (Telemedicine).  Patients are able to view lab/test results, encounter notes, upcoming appointments, etc.  Non-urgent messages can be sent to your provider as well.   To learn more about what you can do with MyChart, go to [email or web address].    Your next appointment:   6 month(s)  The format for your next appointment:   In Person  Provider:   Dr. Lyman Bishop  Other Instructions

## 2019-04-02 NOTE — Progress Notes (Signed)
Virtual Visit via Telephone Note   This visit type was conducted due to national recommendations for restrictions regarding the COVID-19 Pandemic (e.g. social distancing) in an effort to limit this patient's exposure and mitigate transmission in our community.  Due to his co-morbid illnesses, this patient is at least at moderate risk for complications without adequate follow up.  This format is felt to be most appropriate for this patient at this time.  The patient did not have access to video technology/had technical difficulties with video requiring transitioning to audio format only (telephone).  All issues noted in this document were discussed and addressed.  No physical exam could be performed with this format.  Please refer to the patient's chart for his  consent to telehealth for [name].   Evaluation Performed:  Telephone follow-up  Date:  04/02/2019   ID:  Kirk [name], DOB 1960/10/09, MRN [id number]  Patient Location:  [place] [address]  Provider location:   [address] [place], [place] [address]  PCP:  Gayland Curry, DO  Cardiologist:  [name], MD Electrophysiologist:  None   Chief Complaint:  Cramps, fatigue, dizziness  History of Present Illness:    Kirk [name] is a 59 y.o. male who presents via audio/video conferencing for a telehealth visit today.  Kirk [name] is seen today for telephone follow-up.  He reports fatigue as well as some positional dizziness.  He is noted very low blood pressures.  He says after his dialysis he gets cramping and tightness to his skin.  He he and his wife feel that too much fluid is being taken off and they spoke to the nephrologist however have not noticed any change in the orders.  I agree and I suspect this is the case.  He was previously taking his hydralazine 100 mg 3 times daily and recently decreased it to twice daily.  Blood pressure today still below 408 systolic.  He says he has  no energy and feels like he could pass out at times.  LVEF was 40 to 45% in August 2020.  The patient does not have symptoms concerning for COVID-19 infection (fever, chills, cough, or new SHORTNESS OF BREATH).    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)    Anxiety    Asthma    Asthma    moderate persistent   Carpal tunnel syndrome    Cellulitis and abscess of trunk    Cholelithiasis 07/13/2014   Chronic headaches    Debility, unspecified    Dermatophytosis of the body    Dysrhythmia    history of   Edema    End stage renal disease on dialysis ([place])    "MWF; [name]" (10/21/2014)   Essential hypertension, benign    GERD (gastroesophageal reflux disease)    Gout, unspecified    HTN (hypertension)    Hypertrophy of prostate without urinary obstruction and other lower urinary tract symptoms (LUTS)    Hypotension, unspecified    Impotence of organic origin    Insomnia, unspecified    Kidney replaced by transplant    Localization-related (focal) (partial) epilepsy and epileptic syndromes with complex partial seizures, without mention of  intractable epilepsy    Lumbago    Memory loss    OSA on CPAP    Other and unspecified hyperlipidemia    Other chronic nonalcoholic liver disease    Other malaise and fatigue    Other nonspecific abnormal serum enzyme levels    Pain in joint, lower leg    Pain in joint, upper arm    Pneumonia "several times"   Renal dialysis status(V45.11) 02/05/2010   restarted 01/02/13 ofter renal trransplant failure   Secondary hyperparathyroidism (of renal origin)    Shortness of breath    Sleep apnea    Tension headache    Unspecified constipation      Unspecified essential hypertension    Unspecified hereditary and idiopathic peripheral neuropathy    Unspecified vitamin D deficiency     Past Surgical History:  Procedure Laterality Date   AV FISTULA PLACEMENT Left ?2010   "forearm; at [place]"   [place]  03/21/2011   CHOLECYSTECTOMY N/A 10/21/2014   Procedure: LAPAROSCOPIC CHOLECYSTECTOMY WITH INTRAOPERATIVE CHOLANGIOGRAM;  Surgeon: Autumn Messing III, MD;  Location: [facility];  Service: General;  Laterality: N/A;   COLONOSCOPY     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   KIDNEY TRANSPLANT  08/17/2011   [place]  10/21/2014   w/IOC   LEFT HEART CATHETERIZATION WITH CORONARY ANGIOGRAM N/A 03/21/2011   Procedure: LEFT HEART CATHETERIZATION WITH CORONARY ANGIOGRAM;  Surgeon: [name], MD;  Location[name];  Service: Cardiovascular;  Laterality: N/A;   NEPHRECTOMY  08/2013   removed transplaned kidney   POSTERIOR FUSION CERVICAL SPINE  06/25/2012   for spinal stenosis   VASECTOMY  2010    FAMHx:  Family History  Adopted: Yes  Problem Relation Age of Onset   Colon cancer Neg Hx    Esophageal cancer Neg Hx    Rectal cancer Neg Hx    Stomach cancer Neg Hx     SOCHx:   reports that he has been smoking cigarettes. He has a 16.00 pack-year smoking history. He has never used smokeless tobacco. He reports that he does not drink alcohol or use drugs.  ALLERGIES:  Allergies  Allergen Reactions   Codeine Nausea And Vomiting    MEDS:  Current Meds  Medication Sig   acetaminophen (TYLENOL) 500 MG tablet Take 1 tablet (500 mg total) by mouth every 6 (six) hours as needed for moderate pain.   albuterol (PROVENTIL HFA;VENTOLIN HFA) 108 (90 Base) MCG/ACT inhaler Inhale 2 puffs into the lungs every 6 (six) hours as needed for wheezing or shortness of breath.   amLODipine (NORVASC) 10 MG tablet Take 1 tablet (10 mg  total) by mouth daily.   aspirin EC 81 MG tablet Take 81 mg by mouth daily.   b complex-vitamin c-folic acid (NEPHRO-VITE) 0.8 MG TABS tablet TAKE 1 TABLET BY MOUTH EVERY DAY   budesonide-formoterol (SYMBICORT) 160-4.5 MCG/ACT inhaler Inhale 2 puffs into the lungs 2 (two) times daily.   calcitRIOL (ROCALTROL) 0.5 MCG capsule Take 2 capsules (1 mcg total) by mouth every Monday, Wednesday, and Friday with hemodialysis.   Cholecalciferol (VITAMIN D3) 5000 UNITS TABS Take 5,000 Units by mouth daily.    cinacalcet (SENSIPAR) 30 MG tablet Take 90 mg by mouth daily with supper.    clonazePAM (KLONOPIN) 0.5 MG tablet Take 1 tablet (0.5 mg total) by mouth 2 (two) times daily as needed for anxiety.  diphenhydrAMINE (BENADRYL) 25 MG tablet Take 50 mg by mouth at bedtime.    guaiFENesin (MUCINEX) 600 MG 12 hr tablet Take 1,200 mg by mouth 2 (two) times daily.   hydrALAZINE (APRESOLINE) 100 MG tablet Take 1 tablet (100 mg total) by mouth 3 (three) times daily. (Patient taking differently: Take 100 mg by mouth 2 (two) times daily. )   ipratropium-albuterol (DUONEB) 0.5-2.5 (3) MG/3ML SOLN Take 3 mLs by nebulization every 4 (four) hours as needed (Shortness of breath).   lidocaine-prilocaine (EMLA) cream Apply 1 application topically See admin instructions. Apply 1 to 2 hours prior to dialysis on Mondays, Wednesdays, and Fridays. Cover with saran wrap.   metoprolol tartrate (LOPRESSOR) 100 MG tablet Take 1 tablet (100 mg total) by mouth 2 (two) times daily.   omeprazole (PRILOSEC) 20 MG capsule TAKE 1 CAPSULE TWICE DAILY BEFORE A MEAL   ondansetron (ZOFRAN ODT) 4 MG disintegrating tablet Take 1 tablet (4 mg total) by mouth every 8 (eight) hours as needed for nausea or vomiting.   pravastatin (PRAVACHOL) 40 MG tablet Take 1 tablet (40 mg total) by mouth every evening.   PRESCRIPTION MEDICATION Inhale into the lungs at bedtime. CPAP   traMADol (ULTRAM) 50 MG tablet Take 1 tablet (50 mg total) by  mouth daily as needed.     ROS: Pertinent items noted in HPI and remainder of comprehensive ROS otherwise negative.  Labs/Other Tests and Data Reviewed:    Recent Labs: 12/02/2018: B Natriuretic Peptide 1,139.8 12/03/2018: ALT 14; BUN 29; Creatinine, Ser 9.13; Hemoglobin 13.6; Platelets 86; Potassium 4.1; Sodium 136   Recent Lipid Panel Lab Results  Component Value Date/Time   CHOL 123 09/25/2018 03:45 PM   CHOL 195 06/29/2015 03:33 PM   TRIG 151 (H) 09/25/2018 03:45 PM   HDL 31 (L) 09/25/2018 03:45 PM   HDL 17 (L) 06/29/2015 03:33 PM   CHOLHDL 4.0 09/25/2018 03:45 PM   LDLCALC 69 09/25/2018 03:45 PM    Wt Readings from Last 3 Encounters:  04/02/19 215 lb (97.5 kg)  02/05/19 213 lb 3.2 oz (96.7 kg)  01/20/19 212 lb (96.2 kg)     Exam:    Vital Signs:  BP 98/70    Wt 215 lb (97.5 kg)    BMI 33.67 kg/m    Exam not performed due to telephone visit  ASSESSMENT & PLAN:    1. Acute systolic congestive heart failure, suspect nonischemic cardiomyopathy-LVEF 40 to 45% (08/2018) 2. Low risk Myoview stress test with reduced LVEF to 38%, global hypokinesis (08/2018) 3. Angiographically normal, large caliber coronary arteries by cath (2013) 4. Uncontrolled hypertension 5. 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 Status post failed renal transplant - [facility] 7. 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  I advised them to speak again with their nephrologist to readjust the ultrafiltration.  In the meantime we will decrease his blood pressure medicines further, I will coming down on the hydralazine to 50 mg twice daily.  He should contact us within a couple weeks to see if this has been helpful and we may have to consider discontinuing it altogether.  COVID-19 Education: The signs and symptoms of COVID-19 were discussed with  the patient and how to seek care for testing (follow up with PCP or arrange E-visit).  The importance of social distancing was discussed today.  Patient Risk:   After  full review of this patients clinical status, I feel that they are at least moderate risk at this time.  Time:   Today, I have spent 25 minutes with the patient with telehealth technology discussing hypertension, orthostatic symptoms, chronic systolic heart failure, end-stage renal disease on dialysis.     Medication Adjustments/Labs and Tests Ordered: Current medicines are reviewed at length with the patient today.  Concerns regarding medicines are outlined above.   Tests Ordered: No orders of the defined types were placed in this encounter.   Medication Changes: No orders of the defined types were placed in this encounter.   Disposition:  in 6 month(s)  [name], MD, [name], [place] Director of the Advanced Lipid Disorders &  Cardiovascular Risk Reduction Clinic Diplomate of the American Board of Clinical Lipidology Attending Cardiologist  Direct Dial: [phone]   Fax: [phone]  Website:  [email or web address]  [name], MD  04/02/2019 8:25 AM

## 2019-04-08 DIAGNOSIS — R3915 Urgency of urination: Secondary | ICD-10-CM | POA: Diagnosis not present

## 2019-04-20 ENCOUNTER — Telehealth: Payer: Self-pay | Admitting: Internal Medicine

## 2019-04-20 NOTE — Telephone Encounter (Signed)
Thanks, we'll discuss it on Wednesday.  Dr Lemmie Evens

## 2019-04-20 NOTE — Telephone Encounter (Signed)
  Pt c/o of Chest Pain: STAT if CP now or developed within 24 hours  1. Are you having CP right now? no  2. Are you experiencing any other symptoms (ex. SOB, nausea, vomiting, sweating)? Some sweating, very fatigued  3. How long have you been experiencing CP? Couple weeks a month  4. Is your CP continuous or coming and going? Comes and goes  5. Have you taken Nitroglycerin? No   Patient's wife states the patient has been having sharp chest pain that comes and goes for about a month. She states it only lasts a few minutes and afterwards her is very fatigued. She also states his BP has been down around 130/70. ?

## 2019-04-20 NOTE — Telephone Encounter (Signed)
Noted. Thanks.

## 2019-04-20 NOTE — Telephone Encounter (Signed)
Contacted patient wife- she states that for the past month he has had some chest pain, and it comes and goes. He is currently not having chest pain at this time- but she does say they have him on fluid restrictions, but when he goes to dialysis he is always over his dry weight- and she does not understand why. She states that he comes out of dialysis and his skin is so tight with fluid, and he is just holding all the fluid in. Patient wife would like for him to be seen because something is going on with his weight and swelling, and even his chest pains have increased.  I did advised of a DOD spot with Dr.Hilty on Wednesday at 2:45- they would like this spot.  I also advised if chest pains continues and does not go away, arm pain, neck pain, difficulty breathing due to fluid to go to hospital.  Patient wife verbalized understanding, thankful for call.

## 2019-04-21 ENCOUNTER — Ambulatory Visit (INDEPENDENT_AMBULATORY_CARE_PROVIDER_SITE_OTHER): Payer: Medicare Other

## 2019-04-21 ENCOUNTER — Other Ambulatory Visit: Payer: Self-pay

## 2019-04-21 ENCOUNTER — Ambulatory Visit (INDEPENDENT_AMBULATORY_CARE_PROVIDER_SITE_OTHER): Payer: Medicare Other | Admitting: Orthopaedic Surgery

## 2019-04-21 DIAGNOSIS — M1712 Unilateral primary osteoarthritis, left knee: Secondary | ICD-10-CM

## 2019-04-21 DIAGNOSIS — M25511 Pain in right shoulder: Secondary | ICD-10-CM | POA: Diagnosis not present

## 2019-04-21 DIAGNOSIS — G8929 Other chronic pain: Secondary | ICD-10-CM | POA: Diagnosis not present

## 2019-04-21 MED ORDER — LIDOCAINE HCL 2 % IJ SOLN
2.0000 mL | INTRAMUSCULAR | Status: AC | PRN
  Administered 2019-04-21: 2 mL

## 2019-04-21 MED ORDER — BUPIVACAINE HCL 0.25 % IJ SOLN
2.0000 mL | INTRAMUSCULAR | Status: AC | PRN
  Administered 2019-04-21: 2 mL via INTRA_ARTICULAR

## 2019-04-21 MED ORDER — METHYLPREDNISOLONE ACETATE 40 MG/ML IJ SUSP
40.0000 mg | INTRAMUSCULAR | Status: AC | PRN
  Administered 2019-04-21: 40 mg via INTRA_ARTICULAR

## 2019-04-21 MED ORDER — LIDOCAINE HCL 1 % IJ SOLN
2.0000 mL | INTRAMUSCULAR | Status: AC | PRN
  Administered 2019-04-21: 2 mL

## 2019-04-21 NOTE — Progress Notes (Signed)
Office Visit Note   Patient: Kirk [name]           Date of Birth: 12-Oct-1960           MRN: [id number] Visit Date: 04/21/2019              Requested by: Gayland Curry, DO [place],  [place] [address] PCP: Gayland Curry, DO   Assessment & Plan: Visit Diagnoses:  1. Localized osteoarthritis of left knee   2. Chronic right shoulder pain     Plan: Impression is recurrent left knee arthritis flareup and right shoulder subacromial bursitis and rotator cuff tendinitis.  We will inject the left knee joint and right subacromial space with cortisone today.  He will follow-up with [place] as needed.  Follow-Up Instructions: Return if symptoms worsen or fail to improve.   Orders:  Orders Placed This Encounter  Procedures  . Large Joint Inj: L knee  . Large Joint Inj: R subacromial bursa  . XR KNEE 3 VIEW LEFT  . XR Shoulder Right   No orders of the defined types were placed in this encounter.     Procedures: Large Joint Inj: L knee on 04/21/2019 9:19 AM Indications: pain Details: 22 G needle, anterolateral approach Medications: 2 mL lidocaine 1 %; 2 mL bupivacaine 0.25 %; 40 mg methylPREDNISolone acetate 40 MG/ML  Large Joint Inj: R subacromial bursa on 04/21/2019 9:19 AM Indications: pain Details: 22 G needle Medications: 2 mL lidocaine 2 %; 2 mL bupivacaine 0.25 %; 40 mg methylPREDNISolone acetate 40 MG/ML Outcome: tolerated well, no immediate complications Patient was prepped and draped in the usual sterile fashion.       Clinical Data: No additional findings.   Subjective: Chief Complaint  Patient presents with  . Right Shoulder - Pain  . Left Knee - Pain    HPI patient is a pleasant 59 year old gentleman who comes in today with recurrent left knee pain and new onset right shoulder pain.  In regards to his left knee, he has had knee pain for over a year.  He was seen in our office in September 2020 where her left knee was injected with cortisone.  He  notes that this helped a fair amount until recently.  The pain has returned and is to the entire aspect of the left knee.  No symptoms at rest no mechanical symptoms.  His pain is aggravated with any pivoting or pressure to include standing on the left leg.  He has been taking Tylenol without significant relief of symptoms.  No previous gel injection to the left knee.  In regards to his right shoulder, the pain he has is to the entire aspect and occasionally radiates down the entire arm.  He has had this for about 2 months.  No known injury or change in activity.  His pain is aggravated with forward flexion of the shoulder as well as sleeping on the right side.  Tylenol does not seem to help this.  He also notes occasional numbness and tingling to all 5 fingers on the right.  No previous cortisone injection.  Review of Systems as detailed in HPI.  All other reviewed and are negative.   Objective: Vital Signs: There were no vitals taken for this visit.  Physical Exam well-developed well-nourished gentleman in no acute distress.  Alert oriented x3. Ortho Exam examination of his left knee reveals no effusion.  Marked tenderness to the medial joint line range of motion 0  to 120 degrees.  Ligaments are stable.  He is neurovascular intact distally.  Right shoulder exam shows forward flexion to about 90 degrees.  He can internally rotate to his back pocket.  He has significant pain with empty can testing and 4 out of 5 strength.  Positive cross body adduction.  Mild to moderate tenderness to the [facility] joint.Marland Kitchen   Specialty Comments:  No specialty comments available.  Imaging: XR KNEE 3 VIEW LEFT  Result Date: 04/21/2019 Moderate tricompartmental degenerative changes  XR Shoulder Right  Result Date: 04/21/2019 Moderate AC joint space narrowing with osteophytes.  Mild glenohumeral space narrowing.  No superior migration humeral head.    PMFS History: Patient Active Problem List   Diagnosis Date Noted    . Dyspnea and respiratory abnormalities 12/02/2018  . Chronic systolic heart failure ([place]) 09/25/2018  . Moderate persistent asthma without complication 16/10/9602  . Fluid overload 09/17/2018  . Hypertensive urgency 09/17/2018  . Right knee pain 06/02/2018  . Right tibial fracture 06/02/2018  . Cigarette smoker 07/11/2017  . Anxiety 05/30/2017  . Hypokalemia   . 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  . Shunt malfunction 07/03/2016  . Need for hepatitis C screening test 06/29/2015  . Myalgia 05/17/2015  . 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 Obesity hypoventilation syndrome ([place]) 12/09/2014  . Narcotic drug use 12/09/2014  . Hypersomnia with sleep apnea 12/09/2014  . SCC (squamous cell carcinoma), arm 11/09/2014  . Mild persistent chronic asthma without complication 54/09/8117  . ESRD on dialysis ([place]) 09/09/2014  . Essential hypertension 09/09/2014  . HLD (hyperlipidemia) 09/09/2014  . Pancytopenia ([place]) 04/28/2014  . Thrombocytopenia ([place]) 04/10/2014  . Fungal dermatitis 03/17/2014  . S/p nephrectomy 09/17/2013  . Anemia 09/03/2012  . Uncontrolled hypertension   . Tension headache   . Memory loss   . Edema   . Thoracic or lumbosacral neuritis or radiculitis 03/27/2012  . Nerve root pain 03/27/2012  . History of kidney transplant 09/10/2011  . Type 2 diabetes mellitus with diabetic nephropathy, without long-term current use of insulin ([place]) 08/30/2011  . Hearing loss 08/16/2011  . Obesity 08/16/2011  . OSA on CPAP 08/05/2011  . GIB (gastrointestinal bleeding) 10/28/2007   Past Medical History:  Diagnosis Date  . Acute edema of lung, unspecified   . Acute, but ill-defined, cerebrovascular disease   . Allergy   . Anemia   . 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)   . Anxiety   . Asthma   . Asthma    moderate persistent  . Carpal tunnel syndrome   . Cellulitis and abscess of trunk   . Cholelithiasis 07/13/2014  . Chronic headaches   . Debility, unspecified   .  Dermatophytosis of the body   . Dysrhythmia    history of  . Edema   . End stage renal disease on dialysis [facility])    "MWF; [facility]" (10/21/2014)  . Essential hypertension, benign   . GERD (gastroesophageal reflux disease)   . Gout, unspecified   . HTN (hypertension)   . Hypertrophy of prostate without urinary obstruction and other lower urinary tract symptoms (LUTS)   . Hypotension, unspecified   . Impotence of organic origin   . Insomnia, unspecified   . Kidney replaced by transplant   . Localization-related (focal) (partial) epilepsy and epileptic syndromes with complex partial seizures, without mention of intractable epilepsy   . Lumbago   . Memory loss   . OSA on CPAP   . Other and unspecified hyperlipidemia   .  Other chronic nonalcoholic liver disease   . Other malaise and fatigue   . Other nonspecific abnormal serum enzyme levels   . Pain in joint, lower leg   . Pain in joint, upper arm   . Pneumonia "several times"  . Renal dialysis status(V45.11) 02/05/2010   restarted 01/02/13 ofter renal trransplant failure  . Secondary hyperparathyroidism (of renal origin)   . Shortness of breath   . Sleep apnea   . Tension headache   . Unspecified constipation   . Unspecified essential hypertension   . Unspecified hereditary and idiopathic peripheral neuropathy   . Unspecified vitamin D deficiency     Family History  Adopted: Yes  Problem Relation Age of Onset  . Colon cancer Neg Hx   . Esophageal cancer Neg Hx   . Rectal cancer Neg Hx   . Stomach cancer Neg Hx     Past Surgical History:  Procedure Laterality Date  . AV FISTULA PLACEMENT Left ?2010   "forearm; at [facility]"  . BACK SURGERY    . CARDIAC CATHETERIZATION  03/21/2011  . CHOLECYSTECTOMY N/A 10/21/2014   Procedure: LAPAROSCOPIC CHOLECYSTECTOMY WITH INTRAOPERATIVE CHOLANGIOGRAM;  Surgeon: Autumn Messing III, MD;  Location: [facility];  Service: General;  Laterality: N/A;  . COLONOSCOPY     . 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 KIDNEY TRANSPLANT  08/17/2011   [place]  10/21/2014   w/IOC  . LEFT HEART CATHETERIZATION WITH CORONARY ANGIOGRAM N/A 03/21/2011   Procedure: LEFT HEART CATHETERIZATION WITH CORONARY ANGIOGRAM;  Surgeon: [name], MD;  Location: [facility] CATH LAB;  Service: Cardiovascular;  Laterality: N/A;  . NEPHRECTOMY  08/2013   removed transplaned kidney  . POSTERIOR FUSION CERVICAL SPINE  06/25/2012   for spinal stenosis  . VASECTOMY  2010   Social History   Occupational History  . Occupation: disabled    Employer: DISABLED  Tobacco Use  . Smoking status: Current Every Day Smoker    Packs/day: 0.50    Years: 32.00    Pack years: 16.00    Types: Cigarettes  . Smokeless tobacco: Never Used  . Tobacco comment: 3 cigs per day 01/08/2019   Substance and Sexual Activity  . Alcohol use: No    Alcohol/week: 0.0 standard drinks  . Drug use: No  . Sexual activity: Yes

## 2019-04-21 NOTE — Telephone Encounter (Signed)
Scheduled patient for DOD spot on 04/22/19 @ 2:45pm per Almyra Free LPN note

## 2019-04-22 ENCOUNTER — Encounter: Payer: Self-pay | Admitting: Internal Medicine

## 2019-04-22 ENCOUNTER — Ambulatory Visit (INDEPENDENT_AMBULATORY_CARE_PROVIDER_SITE_OTHER): Payer: Medicare Other | Admitting: Internal Medicine

## 2019-04-22 VITALS — BP 152/68 | HR 64 | Temp 95.7°F | Ht 68.0 in | Wt 216.6 lb

## 2019-04-22 DIAGNOSIS — N186 End stage renal disease: Secondary | ICD-10-CM | POA: Diagnosis not present

## 2019-04-22 DIAGNOSIS — R079 Chest pain, unspecified: Secondary | ICD-10-CM | POA: Diagnosis not present

## 2019-04-22 DIAGNOSIS — Q8781 Alport syndrome: Secondary | ICD-10-CM

## 2019-04-22 DIAGNOSIS — I5022 Chronic systolic (congestive) heart failure: Secondary | ICD-10-CM | POA: Diagnosis not present

## 2019-04-22 DIAGNOSIS — Z992 Dependence on renal dialysis: Secondary | ICD-10-CM

## 2019-04-22 DIAGNOSIS — I428 Other cardiomyopathies: Secondary | ICD-10-CM | POA: Diagnosis not present

## 2019-04-22 NOTE — Patient Instructions (Signed)
Medication Instructions:  Your physician recommends that you continue on your current medications as directed. Please refer to the Current Medication list given to you today.  *If you need a refill on your cardiac medications before your next appointment, please call your pharmacy*  Testing/Procedures: Echocardiogram @ [address] - 3rd Floor   Follow-Up: At [facility], you and your health needs are our priority.  As part of our continuing mission to provide you with exceptional heart care, we have created designated Provider Care Teams.  These Care Teams include your primary Cardiologist (physician) and Advanced Practice Providers (APPs -  Physician Assistants and Nurse Practitioners) who all work together to provide you with the care you need, when you need it.  We recommend signing up for the patient portal called "MyChart".  Sign up information is provided on this After Visit Summary.  MyChart is used to connect with patients for Virtual Visits (Telemedicine).  Patients are able to view lab/test results, encounter notes, upcoming appointments, etc.  Non-urgent messages can be sent to your provider as well.   To learn more about what you can do with MyChart, go to [email or web address].    Your next appointment:   3 month(s)  The format for your next appointment:   In Person  Provider:   You may see [name], MD or one of the following Advanced Practice Providers on your designated Care Team:    Almyra Deforest, PA-C  Fabian Sharp, PA-C or   Roby Lofts, [place]    Other Instructions

## 2019-04-23 ENCOUNTER — Encounter: Payer: Self-pay | Admitting: Internal Medicine

## 2019-04-23 DIAGNOSIS — N186 End stage renal disease: Secondary | ICD-10-CM | POA: Diagnosis not present

## 2019-04-23 DIAGNOSIS — Z992 Dependence on renal dialysis: Secondary | ICD-10-CM | POA: Diagnosis not present

## 2019-04-23 DIAGNOSIS — T8612 Kidney transplant failure: Secondary | ICD-10-CM | POA: Diagnosis not present

## 2019-04-23 NOTE — Progress Notes (Signed)
OFFICE NOTE  Chief Complaint:  Chest pain  Primary Care Physician: Gayland Curry, DO  HPI:  Kirk [name] is a 59 y.o. 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  At that time he was having chest pain associated with dialysis which was felt to be musculoskeletal.  His last echocardiogram was in August 2018 which showed mild LVH and EF 55 to 60%.  He presented in 01/2018 with chest pain, worsening shortness of breath and rectal bleeding.  The pain was described as sharp and mostly occurred after dialysis.  It last for a few minutes to a couple hours.  Symptoms are actually somewhat similar to his symptoms back in 2013.  CT angiography was negative for PE however did show bronchitis and small airway bronchiolitis.  He was placed on antibiotics for this.  Troponin was mildly elevated 0.03 and went up to 0.09.  EKG this morning actually showed A. fib with RVR however currently he is in sinus rhythm. I personally reviewed both studies.  His CHADSVASC score is 1, therefore I would not recommend anticoagulation be on aspirin.  Echo in 01/2018 showed LVEF 55% without regional WMA's.   10/21/2018  Mr. Masullo was recently seen in follow-up in August by Doreene Adas, PA-C, who was seeing him for follow-up.  He had had a syncopal episode.  He had missed some dialysis in July and was volume overloaded.  He struggled with uncontrolled hypertension as well.  He is also had some sharp chest pain which radiated down his right arm.  She went ahead and ordered a nuclear stress test which was performed on 09/02/2018 which showed an LVEF of 38% with diffuse hypokinesis and a small fixed mild mid to apical inferior perfusion defect thought to be attenuation  artifact.  Given the decreased LVEF, I recommended a limited echo to verify the LV function.  This was performed on 09/16/2018 demonstrated global hypokinesis with LVEF of 40 to 45% and grade 2 diastolic dysfunction.  Since that time he has had improvement in his symptoms and is better volume optimized on dialysis.  Blood pressure remains elevated, particularly systolics.  Today the office blood pressure was 180/82.  Given end-stage renal disease, his options for heart failure therapy are limited.  As previously mentioned he had no significant coronary disease and large coronary arteries by cath in 2013 by myself.  01/20/2019  Mr. Corrales is seen today in follow-up for heart failure.  Overall he is doing well.  After adding some Imdur blood pressure is improved somewhat.  He is also had better volume management at dialysis.  Weight is actually 2 pounds lighter than it was previously.  His blood pressure is much better controlled today 139/70.  I reviewed blood pressure readings from dialysis over the past several months which showed a gradual improvement in blood pressure.  If we can maintain this, there is a good chance he may improve his LV function.  04/22/2019  Mr. Rawl is seen today in the office for acute chest pain.  He has reported more recurrent chest pain that seems to happen at dialysis.  His wife is mostly concerned because she does not feel that the dialysis center is listening to him.  He does not think this is a crampy pain that  is associated with overdiuresis or pulling too much volume.  In addition he is felt more short of breath.  He has had similar symptoms in the past.  He was seen last August by Doreene Adas, PA-C who ordered a stress test and an echo.  The echo showed decreased LVEF of 40 to 45% and the Myoview showed no significant ischemia.  He had had normal coronaries by cath in 2013.  EKG today shows no ischemia.  PMHx:  Past Medical History:  Diagnosis Date  . Acute edema of  lung, unspecified   . Acute, but ill-defined, cerebrovascular disease   . Allergy   . Anemia   . 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)   . Anxiety   . Asthma   . Asthma    moderate persistent  . Carpal tunnel syndrome   . Cellulitis and abscess of trunk   . Cholelithiasis 07/13/2014  . Chronic headaches   . Debility, unspecified   . Dermatophytosis of the body   . Dysrhythmia    history of  . Edema   . End stage renal disease on dialysis [facility])    "MWF; [facility]" (10/21/2014)  . Essential hypertension, benign   . GERD (gastroesophageal reflux disease)   . Gout, unspecified   . HTN (hypertension)   . Hypertrophy of prostate without urinary obstruction and other lower urinary tract symptoms (LUTS)   . Hypotension, unspecified   . Impotence of organic origin   . Insomnia, unspecified   . Kidney replaced by transplant   . Localization-related (focal) (partial) epilepsy and epileptic syndromes with complex partial seizures, without mention of intractable epilepsy   . Lumbago   . Memory loss   . OSA on CPAP   . Other and unspecified hyperlipidemia   . Other chronic nonalcoholic liver disease   . Other malaise and fatigue   . Other nonspecific abnormal serum enzyme levels   . Pain in joint, lower leg   . Pain in joint, upper arm   . Pneumonia "several times"  . Renal dialysis status(V45.11) 02/05/2010   restarted 01/02/13 ofter renal trransplant failure  . Secondary hyperparathyroidism (of renal origin)   . Shortness of breath   . Sleep apnea   . Tension headache   . Unspecified constipation   . Unspecified essential hypertension   . Unspecified hereditary and idiopathic peripheral neuropathy   . Unspecified vitamin D deficiency     Past Surgical History:  Procedure Laterality Date  . AV FISTULA PLACEMENT Left ?2010   "forearm; at [facility]"  . BACK SURGERY    . CARDIAC CATHETERIZATION  03/21/2011  . CHOLECYSTECTOMY N/A 10/21/2014     Procedure: LAPAROSCOPIC CHOLECYSTECTOMY WITH INTRAOPERATIVE CHOLANGIOGRAM;  Surgeon: Autumn Messing III, MD;  Location: [place];  Service: General;  Laterality: N/A;  . COLONOSCOPY    . 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 KIDNEY TRANSPLANT  08/17/2011   [place]  10/21/2014   w/IOC  . LEFT HEART CATHETERIZATION WITH CORONARY ANGIOGRAM N/A 03/21/2011   Procedure: LEFT HEART CATHETERIZATION WITH CORONARY ANGIOGRAM;  Surgeon: [name], MD;  Location: [facility] CATH LAB;  Service: Cardiovascular;  Laterality: N/A;  . NEPHRECTOMY  08/2013   removed transplaned kidney  . POSTERIOR FUSION CERVICAL SPINE  06/25/2012   for spinal stenosis  . VASECTOMY  2010    FAMHx:  Family History  Adopted: Yes  Problem Relation Age of Onset  .  Colon cancer Neg Hx   . Esophageal cancer Neg Hx   . Rectal cancer Neg Hx   . Stomach cancer Neg Hx     SOCHx:   reports that he has been smoking cigarettes. He has a 16.00 pack-year smoking history. He has never used smokeless tobacco. He reports that he does not drink alcohol or use drugs.  ALLERGIES:  Allergies  Allergen Reactions  . Codeine Nausea And Vomiting    ROS: Pertinent items noted in HPI and remainder of comprehensive ROS otherwise negative.  HOME MEDS: Current Outpatient Medications on File Prior to Visit  Medication Sig Dispense Refill  . acetaminophen (TYLENOL) 500 MG tablet Take 1 tablet (500 mg total) by mouth every 6 (six) hours as needed for moderate pain.    Marland Kitchen albuterol (PROVENTIL HFA;VENTOLIN HFA) 108 (90 Base) MCG/ACT inhaler Inhale 2 puffs into the lungs every 6 (six) hours as needed for wheezing or shortness of breath. 1 Inhaler 0  . amLODipine (NORVASC) 10 MG tablet Take 1 tablet (10 mg total) by mouth daily. 30 tablet 11  . aspirin EC 81 MG tablet Take 81 mg by mouth daily.    Marland Kitchen b complex-vitamin c-folic acid (NEPHRO-VITE) 0.8 MG TABS tablet TAKE 1 TABLET BY MOUTH EVERY DAY 90 tablet  1  . budesonide-formoterol (SYMBICORT) 160-4.5 MCG/ACT inhaler Inhale 2 puffs into the lungs 2 (two) times daily. 1 Inhaler 0  . calcitRIOL (ROCALTROL) 0.5 MCG capsule Take 2 capsules (1 mcg total) by mouth every Monday, Wednesday, and Friday with hemodialysis. 60 capsule 1  . Cholecalciferol (VITAMIN D3) 5000 UNITS TABS Take 5,000 Units by mouth daily.     . cinacalcet (SENSIPAR) 30 MG tablet Take 90 mg by mouth daily with supper.     . clonazePAM (KLONOPIN) 0.5 MG tablet Take 1 tablet (0.5 mg total) by mouth 2 (two) times daily as needed for anxiety. 60 tablet 0  . diphenhydrAMINE (BENADRYL) 25 MG tablet Take 50 mg by mouth at bedtime.     Marland Kitchen guaiFENesin (MUCINEX) 600 MG 12 hr tablet Take 1,200 mg by mouth 2 (two) times daily.    . hydrALAZINE (APRESOLINE) 100 MG tablet Take 50 mg by mouth 2 (two) times daily.    Marland Kitchen ipratropium-albuterol (DUONEB) 0.5-2.5 (3) MG/3ML SOLN Take 3 mLs by nebulization every 4 (four) hours as needed (Shortness of breath). 360 mL 0  . lidocaine-prilocaine (EMLA) cream Apply 1 application topically See admin instructions. Apply 1 to 2 hours prior to dialysis on Mondays, Wednesdays, and Fridays. Cover with saran wrap.    . metoprolol tartrate (LOPRESSOR) 100 MG tablet Take 1 tablet (100 mg total) by mouth 2 (two) times daily. 180 tablet 1  . omeprazole (PRILOSEC) 20 MG capsule TAKE 1 CAPSULE TWICE DAILY BEFORE A MEAL 180 capsule 3  . ondansetron (ZOFRAN ODT) 4 MG disintegrating tablet Take 1 tablet (4 mg total) by mouth every 8 (eight) hours as needed for nausea or vomiting. 20 tablet 0  . pravastatin (PRAVACHOL) 40 MG tablet Take 1 tablet (40 mg total) by mouth every evening. 90 tablet 0  . PRESCRIPTION MEDICATION Inhale into the lungs at bedtime. CPAP    . traMADol (ULTRAM) 50 MG tablet Take 1 tablet (50 mg total) by mouth daily as needed. 30 tablet 0  . isosorbide mononitrate (IMDUR) 30 MG 24 hr tablet Take 1 tablet (30 mg total) by mouth daily. 90 tablet 3   No current  facility-administered medications on file prior to visit.  LABS/IMAGING: No results found for this or any previous visit (from the past 48 hour(s)). No results found.  LIPID PANEL:    Component Value Date/Time   CHOL 123 09/25/2018 1545   CHOL 195 06/29/2015 1533   TRIG 151 (H) 09/25/2018 1545   HDL 31 (L) 09/25/2018 1545   HDL 17 (L) 06/29/2015 1533   CHOLHDL 4.0 09/25/2018 1545   VLDL 32 01/28/2018 0242   LDLCALC 69 09/25/2018 1545     WEIGHTS: Wt Readings from Last 3 Encounters:  04/22/19 216 lb 9.6 oz (98.2 kg)  04/02/19 215 lb (97.5 kg)  02/05/19 213 lb 3.2 oz (96.7 kg)    VITALS: BP (!) 152/68   Pulse 64   Temp (!) 95.7 F (35.4 C)   Ht 5\' 8"  (1.727 m)   Wt 216 lb 9.6 oz (98.2 kg)   SpO2 98%   BMI 32.93 kg/m   EXAM: General appearance: alert and no distress Neck: no carotid bruit, no JVD and thyroid not enlarged, symmetric, no tenderness/mass/nodules Lungs: clear to auscultation bilaterally Heart: regular rate and rhythm, S1, S2 normal, no murmur, click, rub or gallop Abdomen: soft, non-tender; bowel sounds normal; no masses,  no organomegaly Extremities: extremities normal, atraumatic, no cyanosis or edema Pulses: 2+ and symmetric Skin: Skin color, texture, turgor normal. No rashes or lesions Neurologic: Mental status: Alert, oriented, thought content appropriate, Noted hearing loss Psych: Pleasant  EKG: Normal sinus rhythm at 64, voltage criteria for LVH-personally reviewed  ASSESSMENT: 1. Chronic chest pain associated with dialysis 2. Acute systolic congestive heart failure, suspect nonischemic cardiomyopathy-LVEF 40 to 45% (08/2018) 3. Low risk Myoview stress test with reduced LVEF to 38%, global hypokinesis (08/2018) 4. Angiographically normal, large caliber coronary arteries by cath (2013) 5. Uncontrolled hypertension 6. End-stage renal disease on hemodialysis secondary to Alport syndrome 7. Status post failed renal transplant - [facility] 8. 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   Mr. Sudberry is describing somewhat of a chronic chest pain associated with dialysis and had work-up for this by Myoview stress testing last August.  This was low risk but did show some worsening cardiomyopathy.  His volume status is managed at dialysis and his symptoms may be more likely related to worsening cardiomyopathy or volume overload rather than angina.  He had no coronary disease by cath in 2013 and given his recent Myoview it is hard to recommend repeating another stress test.  I think it is unlikely has developed significant obstructive disease at this point but may have a worsening cardiomyopathy.  We will plan a repeat echo to reassess this.  Follow-up with me afterwards.  [name], MD, [facility], [place] Director of the [facility]  [facility] Diplomate of the American Board of Clinical Lipidology Attending Cardiologist  Direct Dial: [phone]  Fax: [phone]  Website:  [email or web address] 04/23/2019, 9:19 AM

## 2019-04-24 DIAGNOSIS — N186 End stage renal disease: Secondary | ICD-10-CM | POA: Diagnosis not present

## 2019-04-24 DIAGNOSIS — N2581 Secondary hyperparathyroidism of renal origin: Secondary | ICD-10-CM | POA: Diagnosis not present

## 2019-04-24 DIAGNOSIS — Z992 Dependence on renal dialysis: Secondary | ICD-10-CM | POA: Diagnosis not present

## 2019-04-24 DIAGNOSIS — Z23 Encounter for immunization: Secondary | ICD-10-CM | POA: Diagnosis not present

## 2019-04-24 DIAGNOSIS — D631 Anemia in chronic kidney disease: Secondary | ICD-10-CM | POA: Diagnosis not present

## 2019-04-27 DIAGNOSIS — N186 End stage renal disease: Secondary | ICD-10-CM | POA: Diagnosis not present

## 2019-04-27 DIAGNOSIS — Z992 Dependence on renal dialysis: Secondary | ICD-10-CM | POA: Diagnosis not present

## 2019-04-27 DIAGNOSIS — N2581 Secondary hyperparathyroidism of renal origin: Secondary | ICD-10-CM | POA: Diagnosis not present

## 2019-04-27 DIAGNOSIS — D631 Anemia in chronic kidney disease: Secondary | ICD-10-CM | POA: Diagnosis not present

## 2019-04-27 DIAGNOSIS — Z23 Encounter for immunization: Secondary | ICD-10-CM | POA: Diagnosis not present

## 2019-04-29 DIAGNOSIS — N2581 Secondary hyperparathyroidism of renal origin: Secondary | ICD-10-CM | POA: Diagnosis not present

## 2019-04-29 DIAGNOSIS — Z992 Dependence on renal dialysis: Secondary | ICD-10-CM | POA: Diagnosis not present

## 2019-04-29 DIAGNOSIS — Z23 Encounter for immunization: Secondary | ICD-10-CM | POA: Diagnosis not present

## 2019-04-29 DIAGNOSIS — N186 End stage renal disease: Secondary | ICD-10-CM | POA: Diagnosis not present

## 2019-04-29 DIAGNOSIS — D631 Anemia in chronic kidney disease: Secondary | ICD-10-CM | POA: Diagnosis not present

## 2019-04-30 ENCOUNTER — Other Ambulatory Visit: Payer: Self-pay | Admitting: Internal Medicine

## 2019-04-30 DIAGNOSIS — E785 Hyperlipidemia, unspecified: Secondary | ICD-10-CM

## 2019-04-30 DIAGNOSIS — F419 Anxiety disorder, unspecified: Secondary | ICD-10-CM

## 2019-05-01 DIAGNOSIS — Z23 Encounter for immunization: Secondary | ICD-10-CM | POA: Diagnosis not present

## 2019-05-01 DIAGNOSIS — Z992 Dependence on renal dialysis: Secondary | ICD-10-CM | POA: Diagnosis not present

## 2019-05-01 DIAGNOSIS — N2581 Secondary hyperparathyroidism of renal origin: Secondary | ICD-10-CM | POA: Diagnosis not present

## 2019-05-01 DIAGNOSIS — D631 Anemia in chronic kidney disease: Secondary | ICD-10-CM | POA: Diagnosis not present

## 2019-05-01 DIAGNOSIS — N186 End stage renal disease: Secondary | ICD-10-CM | POA: Diagnosis not present

## 2019-05-04 ENCOUNTER — Other Ambulatory Visit: Payer: Self-pay

## 2019-05-04 DIAGNOSIS — Z23 Encounter for immunization: Secondary | ICD-10-CM | POA: Diagnosis not present

## 2019-05-04 DIAGNOSIS — D631 Anemia in chronic kidney disease: Secondary | ICD-10-CM | POA: Diagnosis not present

## 2019-05-04 DIAGNOSIS — N2581 Secondary hyperparathyroidism of renal origin: Secondary | ICD-10-CM | POA: Diagnosis not present

## 2019-05-04 DIAGNOSIS — Z992 Dependence on renal dialysis: Secondary | ICD-10-CM | POA: Diagnosis not present

## 2019-05-04 DIAGNOSIS — N186 End stage renal disease: Secondary | ICD-10-CM | POA: Diagnosis not present

## 2019-05-06 DIAGNOSIS — Z23 Encounter for immunization: Secondary | ICD-10-CM | POA: Diagnosis not present

## 2019-05-06 DIAGNOSIS — Z992 Dependence on renal dialysis: Secondary | ICD-10-CM | POA: Diagnosis not present

## 2019-05-06 DIAGNOSIS — N186 End stage renal disease: Secondary | ICD-10-CM | POA: Diagnosis not present

## 2019-05-06 DIAGNOSIS — D631 Anemia in chronic kidney disease: Secondary | ICD-10-CM | POA: Diagnosis not present

## 2019-05-06 DIAGNOSIS — N2581 Secondary hyperparathyroidism of renal origin: Secondary | ICD-10-CM | POA: Diagnosis not present

## 2019-05-07 ENCOUNTER — Ambulatory Visit (HOSPITAL_COMMUNITY): Payer: Medicare Other | Attending: Internal Medicine

## 2019-05-07 ENCOUNTER — Other Ambulatory Visit: Payer: Self-pay

## 2019-05-07 DIAGNOSIS — R079 Chest pain, unspecified: Secondary | ICD-10-CM | POA: Insufficient documentation

## 2019-05-08 DIAGNOSIS — Z992 Dependence on renal dialysis: Secondary | ICD-10-CM | POA: Diagnosis not present

## 2019-05-08 DIAGNOSIS — Z23 Encounter for immunization: Secondary | ICD-10-CM | POA: Diagnosis not present

## 2019-05-08 DIAGNOSIS — D631 Anemia in chronic kidney disease: Secondary | ICD-10-CM | POA: Diagnosis not present

## 2019-05-08 DIAGNOSIS — N186 End stage renal disease: Secondary | ICD-10-CM | POA: Diagnosis not present

## 2019-05-08 DIAGNOSIS — N2581 Secondary hyperparathyroidism of renal origin: Secondary | ICD-10-CM | POA: Diagnosis not present

## 2019-05-11 DIAGNOSIS — Z23 Encounter for immunization: Secondary | ICD-10-CM | POA: Diagnosis not present

## 2019-05-11 DIAGNOSIS — Z992 Dependence on renal dialysis: Secondary | ICD-10-CM | POA: Diagnosis not present

## 2019-05-11 DIAGNOSIS — N2581 Secondary hyperparathyroidism of renal origin: Secondary | ICD-10-CM | POA: Diagnosis not present

## 2019-05-11 DIAGNOSIS — D631 Anemia in chronic kidney disease: Secondary | ICD-10-CM | POA: Diagnosis not present

## 2019-05-11 DIAGNOSIS — N186 End stage renal disease: Secondary | ICD-10-CM | POA: Diagnosis not present

## 2019-05-13 DIAGNOSIS — N2581 Secondary hyperparathyroidism of renal origin: Secondary | ICD-10-CM | POA: Diagnosis not present

## 2019-05-13 DIAGNOSIS — N186 End stage renal disease: Secondary | ICD-10-CM | POA: Diagnosis not present

## 2019-05-13 DIAGNOSIS — Z992 Dependence on renal dialysis: Secondary | ICD-10-CM | POA: Diagnosis not present

## 2019-05-13 DIAGNOSIS — D631 Anemia in chronic kidney disease: Secondary | ICD-10-CM | POA: Diagnosis not present

## 2019-05-13 DIAGNOSIS — Z23 Encounter for immunization: Secondary | ICD-10-CM | POA: Diagnosis not present

## 2019-05-15 DIAGNOSIS — N186 End stage renal disease: Secondary | ICD-10-CM | POA: Diagnosis not present

## 2019-05-15 DIAGNOSIS — Z992 Dependence on renal dialysis: Secondary | ICD-10-CM | POA: Diagnosis not present

## 2019-05-15 DIAGNOSIS — Z23 Encounter for immunization: Secondary | ICD-10-CM | POA: Diagnosis not present

## 2019-05-15 DIAGNOSIS — N2581 Secondary hyperparathyroidism of renal origin: Secondary | ICD-10-CM | POA: Diagnosis not present

## 2019-05-15 DIAGNOSIS — D631 Anemia in chronic kidney disease: Secondary | ICD-10-CM | POA: Diagnosis not present

## 2019-05-18 DIAGNOSIS — N186 End stage renal disease: Secondary | ICD-10-CM | POA: Diagnosis not present

## 2019-05-18 DIAGNOSIS — N2581 Secondary hyperparathyroidism of renal origin: Secondary | ICD-10-CM | POA: Diagnosis not present

## 2019-05-18 DIAGNOSIS — D631 Anemia in chronic kidney disease: Secondary | ICD-10-CM | POA: Diagnosis not present

## 2019-05-18 DIAGNOSIS — Z23 Encounter for immunization: Secondary | ICD-10-CM | POA: Diagnosis not present

## 2019-05-18 DIAGNOSIS — Z992 Dependence on renal dialysis: Secondary | ICD-10-CM | POA: Diagnosis not present

## 2019-05-20 DIAGNOSIS — D631 Anemia in chronic kidney disease: Secondary | ICD-10-CM | POA: Diagnosis not present

## 2019-05-20 DIAGNOSIS — N2581 Secondary hyperparathyroidism of renal origin: Secondary | ICD-10-CM | POA: Diagnosis not present

## 2019-05-20 DIAGNOSIS — Z23 Encounter for immunization: Secondary | ICD-10-CM | POA: Diagnosis not present

## 2019-05-20 DIAGNOSIS — N186 End stage renal disease: Secondary | ICD-10-CM | POA: Diagnosis not present

## 2019-05-20 DIAGNOSIS — Z992 Dependence on renal dialysis: Secondary | ICD-10-CM | POA: Diagnosis not present

## 2019-05-22 DIAGNOSIS — D631 Anemia in chronic kidney disease: Secondary | ICD-10-CM | POA: Diagnosis not present

## 2019-05-22 DIAGNOSIS — N186 End stage renal disease: Secondary | ICD-10-CM | POA: Diagnosis not present

## 2019-05-22 DIAGNOSIS — Z992 Dependence on renal dialysis: Secondary | ICD-10-CM | POA: Diagnosis not present

## 2019-05-22 DIAGNOSIS — N2581 Secondary hyperparathyroidism of renal origin: Secondary | ICD-10-CM | POA: Diagnosis not present

## 2019-05-22 DIAGNOSIS — Z23 Encounter for immunization: Secondary | ICD-10-CM | POA: Diagnosis not present

## 2019-05-23 DIAGNOSIS — N186 End stage renal disease: Secondary | ICD-10-CM | POA: Diagnosis not present

## 2019-05-23 DIAGNOSIS — Z992 Dependence on renal dialysis: Secondary | ICD-10-CM | POA: Diagnosis not present

## 2019-05-23 DIAGNOSIS — T8612 Kidney transplant failure: Secondary | ICD-10-CM | POA: Diagnosis not present

## 2019-05-25 DIAGNOSIS — N2581 Secondary hyperparathyroidism of renal origin: Secondary | ICD-10-CM | POA: Diagnosis not present

## 2019-05-25 DIAGNOSIS — Z992 Dependence on renal dialysis: Secondary | ICD-10-CM | POA: Diagnosis not present

## 2019-05-25 DIAGNOSIS — N186 End stage renal disease: Secondary | ICD-10-CM | POA: Diagnosis not present

## 2019-05-25 DIAGNOSIS — D631 Anemia in chronic kidney disease: Secondary | ICD-10-CM | POA: Diagnosis not present

## 2019-05-27 DIAGNOSIS — Z992 Dependence on renal dialysis: Secondary | ICD-10-CM | POA: Diagnosis not present

## 2019-05-27 DIAGNOSIS — D631 Anemia in chronic kidney disease: Secondary | ICD-10-CM | POA: Diagnosis not present

## 2019-05-27 DIAGNOSIS — N2581 Secondary hyperparathyroidism of renal origin: Secondary | ICD-10-CM | POA: Diagnosis not present

## 2019-05-27 DIAGNOSIS — N186 End stage renal disease: Secondary | ICD-10-CM | POA: Diagnosis not present

## 2019-05-29 DIAGNOSIS — D631 Anemia in chronic kidney disease: Secondary | ICD-10-CM | POA: Diagnosis not present

## 2019-05-29 DIAGNOSIS — N2581 Secondary hyperparathyroidism of renal origin: Secondary | ICD-10-CM | POA: Diagnosis not present

## 2019-05-29 DIAGNOSIS — Z992 Dependence on renal dialysis: Secondary | ICD-10-CM | POA: Diagnosis not present

## 2019-05-29 DIAGNOSIS — N186 End stage renal disease: Secondary | ICD-10-CM | POA: Diagnosis not present

## 2019-06-01 DIAGNOSIS — N186 End stage renal disease: Secondary | ICD-10-CM | POA: Diagnosis not present

## 2019-06-01 DIAGNOSIS — Z992 Dependence on renal dialysis: Secondary | ICD-10-CM | POA: Diagnosis not present

## 2019-06-01 DIAGNOSIS — N2581 Secondary hyperparathyroidism of renal origin: Secondary | ICD-10-CM | POA: Diagnosis not present

## 2019-06-01 DIAGNOSIS — D631 Anemia in chronic kidney disease: Secondary | ICD-10-CM | POA: Diagnosis not present

## 2019-06-03 DIAGNOSIS — Z992 Dependence on renal dialysis: Secondary | ICD-10-CM | POA: Diagnosis not present

## 2019-06-03 DIAGNOSIS — N2581 Secondary hyperparathyroidism of renal origin: Secondary | ICD-10-CM | POA: Diagnosis not present

## 2019-06-03 DIAGNOSIS — D631 Anemia in chronic kidney disease: Secondary | ICD-10-CM | POA: Diagnosis not present

## 2019-06-03 DIAGNOSIS — N186 End stage renal disease: Secondary | ICD-10-CM | POA: Diagnosis not present

## 2019-06-05 DIAGNOSIS — N186 End stage renal disease: Secondary | ICD-10-CM | POA: Diagnosis not present

## 2019-06-05 DIAGNOSIS — N2581 Secondary hyperparathyroidism of renal origin: Secondary | ICD-10-CM | POA: Diagnosis not present

## 2019-06-05 DIAGNOSIS — Z992 Dependence on renal dialysis: Secondary | ICD-10-CM | POA: Diagnosis not present

## 2019-06-05 DIAGNOSIS — D631 Anemia in chronic kidney disease: Secondary | ICD-10-CM | POA: Diagnosis not present

## 2019-06-08 DIAGNOSIS — D631 Anemia in chronic kidney disease: Secondary | ICD-10-CM | POA: Diagnosis not present

## 2019-06-08 DIAGNOSIS — Z992 Dependence on renal dialysis: Secondary | ICD-10-CM | POA: Diagnosis not present

## 2019-06-08 DIAGNOSIS — N186 End stage renal disease: Secondary | ICD-10-CM | POA: Diagnosis not present

## 2019-06-08 DIAGNOSIS — N2581 Secondary hyperparathyroidism of renal origin: Secondary | ICD-10-CM | POA: Diagnosis not present

## 2019-06-10 DIAGNOSIS — N2581 Secondary hyperparathyroidism of renal origin: Secondary | ICD-10-CM | POA: Diagnosis not present

## 2019-06-10 DIAGNOSIS — N186 End stage renal disease: Secondary | ICD-10-CM | POA: Diagnosis not present

## 2019-06-10 DIAGNOSIS — Z992 Dependence on renal dialysis: Secondary | ICD-10-CM | POA: Diagnosis not present

## 2019-06-10 DIAGNOSIS — D631 Anemia in chronic kidney disease: Secondary | ICD-10-CM | POA: Diagnosis not present

## 2019-06-12 ENCOUNTER — Telehealth: Payer: Self-pay | Admitting: Gastroenterology

## 2019-06-12 DIAGNOSIS — N186 End stage renal disease: Secondary | ICD-10-CM | POA: Diagnosis not present

## 2019-06-12 DIAGNOSIS — D631 Anemia in chronic kidney disease: Secondary | ICD-10-CM | POA: Diagnosis not present

## 2019-06-12 DIAGNOSIS — N2581 Secondary hyperparathyroidism of renal origin: Secondary | ICD-10-CM | POA: Diagnosis not present

## 2019-06-12 DIAGNOSIS — Z992 Dependence on renal dialysis: Secondary | ICD-10-CM | POA: Diagnosis not present

## 2019-06-12 NOTE — Telephone Encounter (Signed)
Left message for pt to call back  °

## 2019-06-12 NOTE — Telephone Encounter (Signed)
Patient's wife called stating her husband she having a lot of diarrhea and seems to be dehydrated please advise

## 2019-06-12 NOTE — Telephone Encounter (Signed)
Patient is returning your call.  

## 2019-06-15 DIAGNOSIS — N186 End stage renal disease: Secondary | ICD-10-CM | POA: Diagnosis not present

## 2019-06-15 DIAGNOSIS — N2581 Secondary hyperparathyroidism of renal origin: Secondary | ICD-10-CM | POA: Diagnosis not present

## 2019-06-15 DIAGNOSIS — Z992 Dependence on renal dialysis: Secondary | ICD-10-CM | POA: Diagnosis not present

## 2019-06-15 DIAGNOSIS — D631 Anemia in chronic kidney disease: Secondary | ICD-10-CM | POA: Diagnosis not present

## 2019-06-16 NOTE — Telephone Encounter (Signed)
Left message on machine to call back will await further communication from the pt  

## 2019-06-17 DIAGNOSIS — N2581 Secondary hyperparathyroidism of renal origin: Secondary | ICD-10-CM | POA: Diagnosis not present

## 2019-06-17 DIAGNOSIS — D631 Anemia in chronic kidney disease: Secondary | ICD-10-CM | POA: Diagnosis not present

## 2019-06-17 DIAGNOSIS — N186 End stage renal disease: Secondary | ICD-10-CM | POA: Diagnosis not present

## 2019-06-17 DIAGNOSIS — Z992 Dependence on renal dialysis: Secondary | ICD-10-CM | POA: Diagnosis not present

## 2019-06-18 ENCOUNTER — Other Ambulatory Visit: Payer: Self-pay

## 2019-06-18 ENCOUNTER — Encounter: Payer: Self-pay | Admitting: Internal Medicine

## 2019-06-18 ENCOUNTER — Ambulatory Visit (INDEPENDENT_AMBULATORY_CARE_PROVIDER_SITE_OTHER): Payer: Medicare Other | Admitting: Internal Medicine

## 2019-06-18 VITALS — BP 142/82 | HR 71 | Temp 98.6°F | Ht 68.0 in | Wt 211.0 lb

## 2019-06-18 DIAGNOSIS — E785 Hyperlipidemia, unspecified: Secondary | ICD-10-CM | POA: Diagnosis not present

## 2019-06-18 DIAGNOSIS — R300 Dysuria: Secondary | ICD-10-CM

## 2019-06-18 DIAGNOSIS — N186 End stage renal disease: Secondary | ICD-10-CM

## 2019-06-18 DIAGNOSIS — Z992 Dependence on renal dialysis: Secondary | ICD-10-CM

## 2019-06-18 DIAGNOSIS — Q8781 Alport syndrome: Secondary | ICD-10-CM | POA: Diagnosis not present

## 2019-06-18 DIAGNOSIS — M170 Bilateral primary osteoarthritis of knee: Secondary | ICD-10-CM

## 2019-06-18 DIAGNOSIS — R110 Nausea: Secondary | ICD-10-CM | POA: Diagnosis not present

## 2019-06-18 MED ORDER — ONDANSETRON 4 MG PO TBDP: 4.0000 mg | ORAL_TABLET | Freq: Three times a day (TID) | ORAL | 0 refills | Status: DC | PRN

## 2019-06-18 MED ORDER — TRAMADOL HCL 50 MG PO TABS: 50.0000 mg | ORAL_TABLET | Freq: Every day | ORAL | 0 refills | Status: DC | PRN

## 2019-06-18 MED ORDER — CIPROFLOXACIN HCL 500 MG PO TABS: 500.0000 mg | ORAL_TABLET | Freq: Every day | ORAL | 0 refills | Status: DC

## 2019-06-18 NOTE — Progress Notes (Signed)
Location:  [facility] clinic Provider:  Latasia Silberstein L. Mariea Clonts, D.O., C.M.D.  Goals of Care:  Advanced Directives 06/18/2019  Does Patient Have a Medical Advance Directive? No  Does patient want to make changes to medical advance directive? No - Patient declined  Would patient like information on creating a medical advance directive? -  Pre-existing out of facility DNR order (yellow form or pink MOST form) -     Chief Complaint  Patient presents with  . Medical Management of Chronic Issues    4 month follow up     HPI: Patient is a 59 y.o. male seen today for medical management of chronic diseases.    He had been having diarrhea.  He is due in August for his colonoscopy,  They have not heard back from Dr. Ardis Hughs' office.  He is getting nauseous.  He's needed more zofran the past two weeks.  No vomiting.  Worse if he lays on his back to go to sleep.  Calms down on his side.  Takes his prilosec before meals.    When he urinates, it's burning.  He only pees a little bit with being a dialysis patient.  Not going more than normal.  Normally does not burn.  Will have to go real quick when he does have to go.  Not even enough for a sample.    Past Medical History:  Diagnosis Date  . Acute edema of lung, unspecified   . Acute, but ill-defined, cerebrovascular disease   . Allergy   . Anemia   . 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)   . Anxiety   . Asthma   . Asthma    moderate persistent  . Carpal tunnel syndrome   . Cellulitis and abscess of trunk   . Cholelithiasis 07/13/2014  . Chronic headaches   . Debility, unspecified   . Dermatophytosis of the body   . Dysrhythmia    history of  . Edema   . End stage renal disease on dialysis [facility])    "MWF; [facility]" (10/21/2014)  . Essential hypertension, benign   . GERD (gastroesophageal reflux disease)   . Gout, unspecified   . HTN (hypertension)   . Hypertrophy of prostate without urinary obstruction and other lower  urinary tract symptoms (LUTS)   . Hypotension, unspecified   . Impotence of organic origin   . Insomnia, unspecified   . Kidney replaced by transplant   . Localization-related (focal) (partial) epilepsy and epileptic syndromes with complex partial seizures, without mention of intractable epilepsy   . Lumbago   . Memory loss   . OSA on CPAP   . Other and unspecified hyperlipidemia   . Other chronic nonalcoholic liver disease   . Other malaise and fatigue   . Other nonspecific abnormal serum enzyme levels   . Pain in joint, lower leg   . Pain in joint, upper arm   . Pneumonia "several times"  . Renal dialysis status(V45.11) 02/05/2010   restarted 01/02/13 ofter renal trransplant failure  . Secondary hyperparathyroidism (of renal origin)   . Shortness of breath   . Sleep apnea   . Tension headache   . Unspecified constipation   . Unspecified essential hypertension   . Unspecified hereditary and idiopathic peripheral neuropathy   . Unspecified vitamin D deficiency     Past Surgical History:  Procedure Laterality Date  . AV FISTULA PLACEMENT Left ?2010   "forearm; at [facility]"  . BACK SURGERY    .  CARDIAC CATHETERIZATION  03/21/2011  . CHOLECYSTECTOMY N/A 10/21/2014   Procedure: LAPAROSCOPIC CHOLECYSTECTOMY WITH INTRAOPERATIVE CHOLANGIOGRAM;  Surgeon: Autumn Messing III, MD;  Location: [place];  Service: General;  Laterality: N/A;  . COLONOSCOPY    . 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 KIDNEY TRANSPLANT  08/17/2011   [place]  10/21/2014   w/IOC  . LEFT HEART CATHETERIZATION WITH CORONARY ANGIOGRAM N/A 03/21/2011   Procedure: LEFT HEART CATHETERIZATION WITH CORONARY ANGIOGRAM;  Surgeon: [name], MD;  Location: [facility] CATH LAB;  Service: Cardiovascular;  Laterality: N/A;  . NEPHRECTOMY  08/2013   removed transplaned kidney  . POSTERIOR FUSION CERVICAL SPINE  06/25/2012   for spinal stenosis  . VASECTOMY  2010     Allergies  Allergen Reactions  . Codeine Nausea And Vomiting    Outpatient Encounter Medications as of 06/18/2019  Medication Sig  . acetaminophen (TYLENOL) 500 MG tablet Take 1 tablet (500 mg total) by mouth every 6 (six) hours as needed for moderate pain.  Marland Kitchen albuterol (PROVENTIL HFA;VENTOLIN HFA) 108 (90 Base) MCG/ACT inhaler Inhale 2 puffs into the lungs every 6 (six) hours as needed for wheezing or shortness of breath.  Marland Kitchen amLODipine (NORVASC) 10 MG tablet Take 1 tablet (10 mg total) by mouth daily.  Marland Kitchen aspirin EC 81 MG tablet Take 81 mg by mouth daily.  Marland Kitchen b complex-vitamin c-folic acid (NEPHRO-VITE) 0.8 MG TABS tablet TAKE 1 TABLET BY MOUTH EVERY DAY  . budesonide-formoterol (SYMBICORT) 160-4.5 MCG/ACT inhaler Inhale 2 puffs into the lungs 2 (two) times daily.  . calcitRIOL (ROCALTROL) 0.5 MCG capsule Take 2 capsules (1 mcg total) by mouth every Monday, Wednesday, and Friday with hemodialysis.  . Cholecalciferol (VITAMIN D3) 5000 UNITS TABS Take 5,000 Units by mouth daily.   . cinacalcet (SENSIPAR) 30 MG tablet Take 90 mg by mouth daily with supper.   . clonazePAM (KLONOPIN) 0.5 MG tablet TAKE 1 TABLET (0.5 MG TOTAL) BY MOUTH 2 (TWO) TIMES DAILY AS NEEDED FOR ANXIETY.  . diphenhydrAMINE (BENADRYL) 25 MG tablet Take 50 mg by mouth at bedtime.   Marland Kitchen guaiFENesin (MUCINEX) 600 MG 12 hr tablet Take 1,200 mg by mouth 2 (two) times daily.  . hydrALAZINE (APRESOLINE) 100 MG tablet Take 50 mg by mouth 2 (two) times daily.  Marland Kitchen ipratropium-albuterol (DUONEB) 0.5-2.5 (3) MG/3ML SOLN Take 3 mLs by nebulization every 4 (four) hours as needed (Shortness of breath).  . lidocaine-prilocaine (EMLA) cream Apply 1 application topically See admin instructions. Apply 1 to 2 hours prior to dialysis on Mondays, Wednesdays, and Fridays. Cover with saran wrap.  . metoprolol tartrate (LOPRESSOR) 100 MG tablet Take 1 tablet (100 mg total) by mouth 2 (two) times daily.  Marland Kitchen omeprazole (PRILOSEC) 20 MG capsule TAKE 1  CAPSULE TWICE DAILY BEFORE A MEAL  . pravastatin (PRAVACHOL) 40 MG tablet TAKE 1 TABLET BY MOUTH EVERY DAY IN THE EVENING  . PRESCRIPTION MEDICATION Inhale into the lungs at bedtime. CPAP  . [DISCONTINUED] ondansetron (ZOFRAN ODT) 4 MG disintegrating tablet Take 1 tablet (4 mg total) by mouth every 8 (eight) hours as needed for nausea or vomiting.  . [DISCONTINUED] traMADol (ULTRAM) 50 MG tablet Take 1 tablet (50 mg total) by mouth daily as needed.  . isosorbide mononitrate (IMDUR) 30 MG 24 hr tablet Take 1 tablet (30 mg total) by mouth daily.  . ondansetron (ZOFRAN ODT) 4 MG disintegrating tablet Take 1 tablet (4 mg total) by mouth every  8 (eight) hours as needed for nausea or vomiting.  . traMADol (ULTRAM) 50 MG tablet Take 1 tablet (50 mg total) by mouth daily as needed.   No facility-administered encounter medications on file as of 06/18/2019.    Review of Systems:  Review of Systems  Constitutional: Positive for malaise/fatigue. Negative for chills and fever.  HENT: Positive for hearing loss. Negative for congestion and sore throat.   Eyes: Negative for blurred vision.       Glasses  Respiratory: Negative for cough and shortness of breath.   Cardiovascular: Positive for chest pain. Negative for palpitations and leg swelling.  Gastrointestinal: Positive for heartburn and nausea. Negative for abdominal pain, blood in stool, constipation, diarrhea, melena and vomiting.  Genitourinary: Positive for dysuria and urgency. Negative for flank pain, frequency and hematuria.       Makes very very little urine  Musculoskeletal: Positive for joint pain. Negative for falls.  Skin: Negative for itching and rash.  Neurological: Negative for dizziness and loss of consciousness.  Endo/Heme/Allergies: Does not bruise/bleed easily.  Psychiatric/Behavioral: Negative for depression. The patient is nervous/anxious and has insomnia.     Health Maintenance  Topic Date Due  . OPHTHALMOLOGY EXAM  Never done   . FOOT EXAM  04/29/2014  . HEMOGLOBIN A1C  03/25/2019  . TETANUS/TDAP  10/09/2019 (Originally 01/23/2016)  . INFLUENZA VACCINE  08/23/2019  . COLONOSCOPY  08/30/2019  . PNEUMOCOCCAL POLYSACCHARIDE VACCINE AGE 49-64 HIGH RISK  Completed  . COVID-19 Vaccine  Completed  . Hepatitis C Screening  Completed  . HIV Screening  Completed    Physical Exam: Vitals:   06/18/19 1549  BP: (!) 142/82  Pulse: 71  Temp: 98.6 F (37 C)  TempSrc: Temporal  SpO2: 97%  Weight: 211 lb (95.7 kg)  Height: 5\' 8"  (1.727 m)   Body mass index is 32.08 kg/m. Physical Exam Constitutional:      General: He is not in acute distress.    Appearance: Normal appearance. He is obese. He is not toxic-appearing.     Comments: Awake and alert today--talking to me   HENT:     Head: Normocephalic and atraumatic.     Right Ear: External ear normal.     Left Ear: External ear normal.     Ears:     Comments: Right TM with a hole in it    Nose: Nose normal.     Mouth/Throat:     Pharynx: Oropharynx is clear.  Eyes:     Extraocular Movements: Extraocular movements intact.     Pupils: Pupils are equal, round, and reactive to light.     Comments: glasses  Cardiovascular:     Rate and Rhythm: Normal rate and regular rhythm.     Heart sounds: Murmur present.  Pulmonary:     Effort: Pulmonary effort is normal.     Breath sounds: Normal breath sounds. No rales.  Abdominal:     General: Bowel sounds are normal.     Palpations: Abdomen is soft. There is no mass.     Tenderness: There is no abdominal tenderness. There is no guarding or rebound.     Hernia: No hernia is present.  Musculoskeletal:        General: Normal range of motion.     Right lower leg: No edema.     Left lower leg: No edema.     Comments: Left arm HD access with normal pulse and thrill   Skin:    General: Skin  is warm and dry.     Capillary Refill: Capillary refill takes less than 2 seconds.  Neurological:     General: No focal deficit  present.     Mental Status: He is alert and oriented to person, place, and time.  Psychiatric:        Mood and Affect: Mood normal.        Behavior: Behavior normal.     Labs reviewed: Basic Metabolic Panel: Recent Labs    08/13/18 0613 09/17/18 1218 09/18/18 0459 09/19/18 0403 12/02/18 2031 12/02/18 2031 12/02/18 2052 12/03/18 0333 12/03/18 1910  NA 135   < > 138   < > 140   < > 140 139 136  K 3.6   < > 4.3   < > 4.4   < > 4.1 3.9 4.1  CL 96*   < > 97*   < > 98  --   --  98 95*  CO2 23   < > 28   < > 23  --   --  24 23  GLUCOSE 121*   < > 115*   < > 129*  --   --  181* 112*  BUN 46*   < > 33*   < > 48*  --   --  53* 29*  CREATININE 13.97*   < > 9.63*   < > 12.83*  --   --  13.84* 9.13*  CALCIUM 8.7*   < > 8.6*   < > 9.0  --   --  8.6* 9.2  PHOS 6.6*  --  6.3*  --   --   --   --   --  5.4*   < > = values in this interval not displayed.   Liver Function Tests: Recent Labs    08/12/18 1644 08/13/18 0613 12/02/18 2031 12/03/18 0333 12/03/18 1910  AST 27  --  20 16  --   ALT 35  --  16 14  --   ALKPHOS 87  --  91 81  --   BILITOT 0.8  --  0.9 0.5  --   PROT 6.8  --  7.6 6.8  --   ALBUMIN 3.6   < > 4.0 3.4* 4.0   < > = values in this interval not displayed.   No results for input(s): LIPASE, AMYLASE in the last 8760 hours. Recent Labs    08/12/18 1644  AMMONIA 34   CBC: Recent Labs    09/19/18 0403 09/19/18 0403 12/02/18 2031 12/02/18 2031 12/02/18 2052 12/03/18 0333 12/03/18 1910  WBC 3.7*   < > 6.9  --   --  6.0 6.3  NEUTROABS 2.4  --  5.5  --   --   --   --   HGB 8.9*   < > 13.8   < > 13.9 11.9* 13.6  HCT 28.0*   < > 44.1   < > 41.0 37.8* 43.4  MCV 101.8*   < > 95.5  --   --  95.7 94.3  PLT 101*   < > 98*  --   --  89* 86*   < > = values in this interval not displayed.   Lipid Panel: Recent Labs    09/25/18 1545  CHOL 123  HDL 31*  LDLCALC 69  TRIG 151*  CHOLHDL 4.0   Lab Results  Component Value Date   HGBA1C 5.4 09/25/2018     Assessment/Plan 1. ESRD on dialysis [facility]) -continues  his mon/wed/fri at [facility]--sounds like he's more euvolemic now not getting chest pressure form too much fluid taken off like he was  2. Bilateral primary osteoarthritis of knee -continues, uses tramadol rarely--wife reports they only give them 7 at the pharmacy though we are not informed of this at our end - traMADol (ULTRAM) 50 MG tablet; Take 1 tablet (50 mg total) by mouth daily as needed.  Dispense: 30 tablet; Refill: 0  3. Hyperlipidemia, unspecified hyperlipidemia type -educated on healthy diet as far as fat/cholesterol--cont pravachol  4. 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 Nausea -seems this is GERD as he gets chest pain that is relieved turing on either side and not associated with sob or orthopnea   6. Dysuria -he reports he cannot provide enough urine to be tested but does get dysuria and considerable urgency when he does actually need to pass a few drops of urine -he has a h/o numerous hospitalizations with sepsis and we will try to prevent this by treating him empirically  - ciprofloxacin (CIPRO) 500 MG tablet; Take 1 tablet (500 mg total) by mouth daily with breakfast.  Dispense: 14 tablet; Refill: 0  Labs/tests ordered:   Lab Orders  No laboratory test(s) ordered today   Next appt:  10/22/2019  Jauna Raczynski L. Anjanette Gilkey, D.O. [facility] [address], [place] [address] Cell Phone (Mon-Fri 8am-5pm):  [phone] On Call:  [phone] & follow prompts after 5pm & weekends Office Phone:  [phone] Office Fax:  [phone]

## 2019-06-19 DIAGNOSIS — N186 End stage renal disease: Secondary | ICD-10-CM | POA: Diagnosis not present

## 2019-06-19 DIAGNOSIS — D631 Anemia in chronic kidney disease: Secondary | ICD-10-CM | POA: Diagnosis not present

## 2019-06-19 DIAGNOSIS — Z992 Dependence on renal dialysis: Secondary | ICD-10-CM | POA: Diagnosis not present

## 2019-06-19 DIAGNOSIS — N2581 Secondary hyperparathyroidism of renal origin: Secondary | ICD-10-CM | POA: Diagnosis not present

## 2019-06-19 NOTE — Telephone Encounter (Signed)
Ov scheduled on 6/1 at 8:50am with Dr. Ardis Hughs. He had a cancellation.

## 2019-06-22 DIAGNOSIS — N2581 Secondary hyperparathyroidism of renal origin: Secondary | ICD-10-CM | POA: Diagnosis not present

## 2019-06-22 DIAGNOSIS — N186 End stage renal disease: Secondary | ICD-10-CM | POA: Diagnosis not present

## 2019-06-22 DIAGNOSIS — Z992 Dependence on renal dialysis: Secondary | ICD-10-CM | POA: Diagnosis not present

## 2019-06-22 DIAGNOSIS — D631 Anemia in chronic kidney disease: Secondary | ICD-10-CM | POA: Diagnosis not present

## 2019-06-23 ENCOUNTER — Ambulatory Visit (INDEPENDENT_AMBULATORY_CARE_PROVIDER_SITE_OTHER): Payer: Medicare Other | Admitting: Gastroenterology

## 2019-06-23 ENCOUNTER — Encounter: Payer: Self-pay | Admitting: Gastroenterology

## 2019-06-23 ENCOUNTER — Other Ambulatory Visit: Payer: Medicare Other

## 2019-06-23 VITALS — BP 118/56 | HR 61 | Ht 68.0 in | Wt 207.6 lb

## 2019-06-23 DIAGNOSIS — R197 Diarrhea, unspecified: Secondary | ICD-10-CM | POA: Diagnosis not present

## 2019-06-23 MED ORDER — FAMOTIDINE 20 MG PO TABS: 20.0000 mg | ORAL_TABLET | Freq: Every day | ORAL | Status: DC

## 2019-06-23 NOTE — Progress Notes (Signed)
Review of pertinent gastrointestinal problems: 1. Personal history of tubular adenomas, colonoscopy May 2009.Recommended to have repeat colonoscopy at 3 year interval. A reminder letter was sent in 2012. Colonoscopy 06/2011 Dr. Ardis Hughs, two subcentimeter polyps removed, were adenomatous. Recommended recall at 5 year interval. Colonoscopy August 2014found 3 polyps, one was 10 mm, all were adenomatous. Recall at 3-year interval. 2. new anemia prompted EGD October 2009. Mild gastritis, duodenitis H. pylori positive on biopsy. 3. C diff TOXIN + diarrhea:09/2015  HPI: This is a very pleasant 59 year old man who is here with his wife today.  Last saw in February 2020.  He was having some minor rectal bleeding.  That has completely resolved.  He is here today because of acute diarrhea.  He noticed diarrhea especially in the evening hours starting 8 to 10 days ago.  Nonbloody diarrhea.  He has had some urgent episodes.  One night he was going to the bathroom every 15 minutes or so.  No nausea or vomiting.  No sick contacts.  He was put on Cipro for a urinary tract infection 3 to 4 days ago, after the diarrhea started.  No other antibiotics in the last 3 to 4 months  He has had no abdominal pains.  He also mentioned he has had worse overnight heartburn for the past several months.  He is on omeprazole which he takes before breakfast and dinner 20 mg.  ROS: complete GI ROS as described in HPI, all other review negative.  Constitutional:  No unintentional weight loss   Past Medical History:  Diagnosis Date  . Acute edema of lung, unspecified   . Acute, but ill-defined, cerebrovascular disease   . Allergy   . Anemia   . 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)   . Anxiety   . Asthma   . Asthma    moderate persistent  . Carpal tunnel syndrome   . Cellulitis and abscess of trunk   . Cholelithiasis 07/13/2014  . Chronic headaches   . Debility, unspecified   . Dermatophytosis of the body    . Dysrhythmia    history of  . Edema   . End stage renal disease on dialysis [facility])    "MWF; [facility]" (10/21/2014)  . Essential hypertension, benign   . GERD (gastroesophageal reflux disease)   . Gout, unspecified   . HTN (hypertension)   . Hypertrophy of prostate without urinary obstruction and other lower urinary tract symptoms (LUTS)   . Hypotension, unspecified   . Impotence of organic origin   . Insomnia, unspecified   . Kidney replaced by transplant   . Localization-related (focal) (partial) epilepsy and epileptic syndromes with complex partial seizures, without mention of intractable epilepsy   . Lumbago   . Memory loss   . OSA on CPAP   . Other and unspecified hyperlipidemia   . Other chronic nonalcoholic liver disease   . Other malaise and fatigue   . Other nonspecific abnormal serum enzyme levels   . Pain in joint, lower leg   . Pain in joint, upper arm   . Pneumonia "several times"  . Renal dialysis status(V45.11) 02/05/2010   restarted 01/02/13 ofter renal trransplant failure  . Secondary hyperparathyroidism (of renal origin)   . Shortness of breath   . Sleep apnea   . Tension headache   . Unspecified constipation   . Unspecified essential hypertension   . Unspecified hereditary and idiopathic peripheral neuropathy   . Unspecified vitamin D deficiency  Past Surgical History:  Procedure Laterality Date  . AV FISTULA PLACEMENT Left ?2010   "forearm; at [facility]"  . BACK SURGERY    . CARDIAC CATHETERIZATION  03/21/2011  . CHOLECYSTECTOMY N/A 10/21/2014   Procedure: LAPAROSCOPIC CHOLECYSTECTOMY WITH INTRAOPERATIVE CHOLANGIOGRAM;  Surgeon: Autumn Messing III, MD;  Location: [place];  Service: General;  Laterality: N/A;  . COLONOSCOPY    . 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 KIDNEY TRANSPLANT  08/17/2011   [place]  10/21/2014   w/IOC  . LEFT HEART CATHETERIZATION WITH  CORONARY ANGIOGRAM N/A 03/21/2011   Procedure: LEFT HEART CATHETERIZATION WITH CORONARY ANGIOGRAM;  Surgeon: [name], MD;  Location: [facility] CATH LAB;  Service: Cardiovascular;  Laterality: N/A;  . NEPHRECTOMY  08/2013   removed transplaned kidney  . POSTERIOR FUSION CERVICAL SPINE  06/25/2012   for spinal stenosis  . VASECTOMY  2010    Current Outpatient Medications  Medication Sig Dispense Refill  . acetaminophen (TYLENOL) 500 MG tablet Take 1 tablet (500 mg total) by mouth every 6 (six) hours as needed for moderate pain.    Marland Kitchen albuterol (PROVENTIL HFA;VENTOLIN HFA) 108 (90 Base) MCG/ACT inhaler Inhale 2 puffs into the lungs every 6 (six) hours as needed for wheezing or shortness of breath. 1 Inhaler 0  . amLODipine (NORVASC) 10 MG tablet Take 1 tablet (10 mg total) by mouth daily. 30 tablet 11  . aspirin EC 81 MG tablet Take 81 mg by mouth daily.    Marland Kitchen b complex-vitamin c-folic acid (NEPHRO-VITE) 0.8 MG TABS tablet TAKE 1 TABLET BY MOUTH EVERY DAY 90 tablet 1  . budesonide-formoterol (SYMBICORT) 160-4.5 MCG/ACT inhaler Inhale 2 puffs into the lungs 2 (two) times daily. 1 Inhaler 0  . calcitRIOL (ROCALTROL) 0.5 MCG capsule Take 2 capsules (1 mcg total) by mouth every Monday, Wednesday, and Friday with hemodialysis. 60 capsule 1  . Cholecalciferol (VITAMIN D3) 5000 UNITS TABS Take 5,000 Units by mouth daily.     . cinacalcet (SENSIPAR) 30 MG tablet Take 90 mg by mouth daily with supper.     . ciprofloxacin (CIPRO) 500 MG tablet Take 1 tablet (500 mg total) by mouth daily with breakfast. 14 tablet 0  . clonazePAM (KLONOPIN) 0.5 MG tablet TAKE 1 TABLET (0.5 MG TOTAL) BY MOUTH 2 (TWO) TIMES DAILY AS NEEDED FOR ANXIETY. 60 tablet 0  . diphenhydrAMINE (BENADRYL) 25 MG tablet Take 50 mg by mouth at bedtime.     Marland Kitchen guaiFENesin (MUCINEX) 600 MG 12 hr tablet Take 1,200 mg by mouth 2 (two) times daily.    . hydrALAZINE (APRESOLINE) 100 MG tablet Take 50 mg by mouth 2 (two) times daily.    Marland Kitchen  ipratropium-albuterol (DUONEB) 0.5-2.5 (3) MG/3ML SOLN Take 3 mLs by nebulization every 4 (four) hours as needed (Shortness of breath). 360 mL 0  . lidocaine-prilocaine (EMLA) cream Apply 1 application topically See admin instructions. Apply 1 to 2 hours prior to dialysis on Mondays, Wednesdays, and Fridays. Cover with saran wrap.    . metoprolol tartrate (LOPRESSOR) 100 MG tablet Take 1 tablet (100 mg total) by mouth 2 (two) times daily. 180 tablet 1  . omeprazole (PRILOSEC) 20 MG capsule TAKE 1 CAPSULE TWICE DAILY BEFORE A MEAL 180 capsule 3  . ondansetron (ZOFRAN ODT) 4 MG disintegrating tablet Take 1 tablet (4 mg total) by mouth every 8 (eight) hours as needed for nausea or vomiting. 20 tablet 0  . pravastatin (  PRAVACHOL) 40 MG tablet TAKE 1 TABLET BY MOUTH EVERY DAY IN THE EVENING 90 tablet 1  . PRESCRIPTION MEDICATION Inhale into the lungs at bedtime. CPAP    . traMADol (ULTRAM) 50 MG tablet Take 1 tablet (50 mg total) by mouth daily as needed. 30 tablet 0  . isosorbide mononitrate (IMDUR) 30 MG 24 hr tablet Take 1 tablet (30 mg total) by mouth daily. 90 tablet 3   No current facility-administered medications for this visit.    Allergies as of 06/23/2019 - Review Complete 06/23/2019  Allergen Reaction Noted  . Codeine Nausea And Vomiting 05/20/2007    Family History  Adopted: Yes  Problem Relation Age of Onset  . Colon cancer Neg Hx   . Esophageal cancer Neg Hx   . Rectal cancer Neg Hx   . Stomach cancer Neg Hx     Social History   Socioeconomic History  . Marital status: Married    Spouse name: [name]  . Number of children: 3  . Years of education: Not on file  . Highest education level: Not on file  Occupational History  . Occupation: disabled    Employer: DISABLED  Tobacco Use  . Smoking status: Current Every Day Smoker    Packs/day: 0.50    Years: 32.00    Pack years: 16.00    Types: Cigarettes  . Smokeless tobacco: Never Used  . Tobacco comment: 3 cigs per day  01/08/2019   Substance and Sexual Activity  . Alcohol use: No    Alcohol/week: 0.0 standard drinks  . Drug use: No  . Sexual activity: Yes  Other Topics Concern  . Not on file  Social History Narrative   Drinks 1 cup of caffeine daily.   Social Determinants of Health   Financial Resource Strain:   . Difficulty of Paying Living Expenses:   Food Insecurity:   . Worried About Charity fundraiser in the Last Year:   . Arboriculturist in the Last Year:   Transportation Needs:   . Film/video editor (Medical):   Marland Kitchen Lack of Transportation (Non-Medical):   Physical Activity:   . Days of Exercise per Week:   . Minutes of Exercise per Session:   Stress:   . Feeling of Stress :   Social Connections:   . Frequency of Communication with Friends and Family:   . Frequency of Social Gatherings with Friends and Family:   . Attends Religious Services:   . Active Member of Clubs or Organizations:   . Attends Archivist Meetings:   Marland Kitchen Marital Status:   Intimate Partner Violence:   . Fear of Current or Ex-Partner:   . Emotionally Abused:   Marland Kitchen Physically Abused:   . Sexually Abused:      Physical Exam: BP (!) 118/56   Pulse 61   Ht 5\' 8"  (1.727 m)   Wt 207 lb 9.6 oz (94.2 kg)   SpO2 97%   BMI 31.57 kg/m  Constitutional: generally well-appearing Psychiatric: alert and oriented x3 Abdomen: soft, nontender, nondistended, no obvious ascites, no peritoneal signs, normal bowel sounds No peripheral edema noted in lower extremities  Assessment and plan: 59 y.o. male with acute diarrhea, GERD  Certainly this could be infectious.  He has had documented C. difficile in the past.  He is going to complete a GI pathogen panel.  If it is negative then I will likely try scheduled Imodium.  He understands that if this is all not helpful  that he might need further testing.  His GERD has been a bit worse with overnight acid symptoms.  I recommended that he try Pepcid 20 mg pill at  bedtime every night.  Please see the "Patient Instructions" section for addition details about the plan.  Owens Loffler, MD [facility] 06/23/2019, 8:39 AM   Total time on date of encounter was 30 minutes (this included time spent preparing to see the patient reviewing records; obtaining and/or reviewing separately obtained history; performing a medically appropriate exam and/or evaluation; counseling and educating the patient and family if present; ordering medications, tests or procedures if applicable; and documenting clinical information in the health record).

## 2019-06-23 NOTE — Patient Instructions (Signed)
If you are age 57 or older, your body mass index should be between 23-30. Your Body mass index is 31.57 kg/m. If this is out of the aforementioned range listed, please consider follow up with your Primary Care Provider.  If you are age 47 or younger, your body mass index should be between 19-25. Your Body mass index is 31.57 kg/m. If this is out of the aformentioned range listed, please consider follow up with your Primary Care Provider.   Your provider has requested that you go to the basement level for lab work before leaving today. Press "B" on the elevator. The lab is located at the first door on the left as you exit the elevator.  Due to recent changes in healthcare laws, you may see the results of your imaging and laboratory studies on MyChart before your provider has had a chance to review them.  We understand that in some cases there may be results that are confusing or concerning to you. Not all laboratory results come back in the same time frame and the provider may be waiting for multiple results in order to interpret others.  Please give [place] 48 hours in order for your provider to thoroughly review all the results before contacting the office for clarification of your results.   Please purchase the following medications over the counter and take as directed:  START: pepcid 20mg  take one tablet at bedtime every night.  Thank you for entrusting me with your care and choosing [facility].  Dr Ardis Hughs

## 2019-06-25 LAB — GI PROFILE, STOOL, PCR

## 2019-06-29 ENCOUNTER — Other Ambulatory Visit: Payer: Self-pay | Admitting: *Deleted

## 2019-06-29 MED ORDER — ONDANSETRON 4 MG PO TBDP: 4.0000 mg | ORAL_TABLET | Freq: Three times a day (TID) | ORAL | 1 refills | Status: DC | PRN

## 2019-06-29 NOTE — Telephone Encounter (Signed)
Patient wife, Mechele Claude called and stated that patient was seen about 2 weeks ago and patient's nausea medication was to be sent to pharmacy for refill but [facility] has not received it yet.  Pended Rx and sent to Dr. Mariea Clonts for approval.     OV Note Dated 06/18/19: He had been having diarrhea.  He is due in August for his colonoscopy,  They have not heard back from Dr. Ardis Hughs' office.  He is getting nauseous.  He's needed more zofran the past two weeks.  No vomiting.  Worse if he lays on his back to go to sleep.  Calms down on his side.  Takes his prilosec before meals.

## 2019-07-01 ENCOUNTER — Other Ambulatory Visit: Payer: Self-pay

## 2019-07-01 ENCOUNTER — Ambulatory Visit (INDEPENDENT_AMBULATORY_CARE_PROVIDER_SITE_OTHER): Payer: Medicare Other | Admitting: Internal Medicine

## 2019-07-01 ENCOUNTER — Encounter: Payer: Self-pay | Admitting: Internal Medicine

## 2019-07-01 VITALS — BP 198/78 | HR 80 | Ht 67.0 in | Wt 210.4 lb

## 2019-07-01 DIAGNOSIS — Q8781 Alport syndrome: Secondary | ICD-10-CM

## 2019-07-01 DIAGNOSIS — Z992 Dependence on renal dialysis: Secondary | ICD-10-CM | POA: Diagnosis not present

## 2019-07-01 DIAGNOSIS — R079 Chest pain, unspecified: Secondary | ICD-10-CM | POA: Diagnosis not present

## 2019-07-01 DIAGNOSIS — N186 End stage renal disease: Secondary | ICD-10-CM

## 2019-07-01 DIAGNOSIS — I5022 Chronic systolic (congestive) heart failure: Secondary | ICD-10-CM

## 2019-07-01 NOTE — Patient Instructions (Signed)
Medication Instructions:  Your physician recommends that you continue on your current medications as directed. Please refer to the Current Medication list given to you today.  *If you need a refill on your cardiac medications before your next appointment, please call your pharmacy*   Follow-Up: At [facility], you and your health needs are our priority.  As part of our continuing mission to provide you with exceptional heart care, we have created designated Provider Care Teams.  These Care Teams include your primary Cardiologist (physician) and Advanced Practice Providers (APPs -  Physician Assistants and Nurse Practitioners) who all work together to provide you with the care you need, when you need it.  We recommend signing up for the patient portal called "MyChart".  Sign up information is provided on this After Visit Summary.  MyChart is used to connect with patients for Virtual Visits (Telemedicine).  Patients are able to view lab/test results, encounter notes, upcoming appointments, etc.  Non-urgent messages can be sent to your provider as well.   To learn more about what you can do with MyChart, go to [email or web address].    Your next appointment:   12 month(s)  The format for your next appointment:   In Person  Provider:   You may see Kenneth C Hilty, MD or one of the following Advanced Practice Providers on your designated Care Team:    Hao Meng, PA-C  Angela Duke, PA-C or   Krista Kroeger, PA-C    Other Instructions   

## 2019-07-01 NOTE — Progress Notes (Signed)
OFFICE NOTE  Chief Complaint:  Chest pain  Primary Care Physician: Gayland Curry, DO  HPI:  Kirk [name] is a 59 y.o. 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  At that time he was having chest pain associated with dialysis which was felt to be musculoskeletal.  His last echocardiogram was in August 2018 which showed mild LVH and EF 55 to 60%.  He presented in 01/2018 with chest pain, worsening shortness of breath and rectal bleeding.  The pain was described as sharp and mostly occurred after dialysis.  It last for a few minutes to a couple hours.  Symptoms are actually somewhat similar to his symptoms back in 2013.  CT angiography was negative for PE however did show bronchitis and small airway bronchiolitis.  He was placed on antibiotics for this.  Troponin was mildly elevated 0.03 and went up to 0.09.  EKG this morning actually showed A. fib with RVR however currently he is in sinus rhythm. I personally reviewed both studies.  His CHADSVASC score is 1, therefore I would not recommend anticoagulation be on aspirin.  Echo in 01/2018 showed LVEF 55% without regional WMA's.   10/21/2018  Kirk [name] was recently seen in follow-up in August by Doreene Adas, PA-C, who was seeing him for follow-up.  He had had a syncopal episode.  He had missed some dialysis in July and was volume overloaded.  He struggled with uncontrolled hypertension as well.  He is also had some sharp chest pain which radiated down his right arm.  She went ahead and ordered a nuclear stress test which was performed on 09/02/2018 which showed an LVEF of 38% with diffuse hypokinesis and a small fixed mild mid to apical inferior perfusion defect thought to be attenuation  artifact.  Given the decreased LVEF, I recommended a limited echo to verify the LV function.  This was performed on 09/16/2018 demonstrated global hypokinesis with LVEF of 40 to 45% and grade 2 diastolic dysfunction.  Since that time he has had improvement in his symptoms and is better volume optimized on dialysis.  Blood pressure remains elevated, particularly systolics.  Today the office blood pressure was 180/82.  Given end-stage renal disease, his options for heart failure therapy are limited.  As previously mentioned he had no significant coronary disease and large coronary arteries by cath in 2013 by myself.  01/20/2019  Kirk [name] is seen today in follow-up for heart failure.  Overall he is doing well.  After adding some Imdur blood pressure is improved somewhat.  He is also had better volume management at dialysis.  Weight is actually 2 pounds lighter than it was previously.  His blood pressure is much better controlled today 139/70.  I reviewed blood pressure readings from dialysis over the past several months which showed a gradual improvement in blood pressure.  If we can maintain this, there is a good chance he may improve his LV function.  04/22/2019  Kirk [name] is seen today in the office for acute chest pain.  He has reported more recurrent chest pain that seems to happen at dialysis.  His wife is mostly concerned because she does not feel that the dialysis center is listening to him.  He does not think this is a crampy pain that  is associated with overdiuresis or pulling too much volume.  In addition he is felt more short of breath.  He has had similar symptoms in the past.  He was seen last August by Doreene Adas, PA-C who ordered a stress test and an echo.  The echo showed decreased LVEF of 40 to 45% and the Myoview showed no significant ischemia.  He had had normal coronaries by cath in 2013.  EKG today shows no ischemia.  07/01/2019  Kirk [name] returns today for follow-up.  He underwent  a repeat echo which showed normalization of LV function with no wall motion abnormalities.  This makes me much less suspicious that his chest pain is cardiac.  It does seem to be intermittent and only associated with dialysis.  He and his wife are very frustrated with dialysis and have changed dialysis centers.  They are hoping that this will help with some of these issues.  Overall I feel like he is doing fairly well and may be again a candidate for possible renal transplant since his LV function has improved.  PMHx:  Past Medical History:  Diagnosis Date  . Acute edema of lung, unspecified   . Acute, but ill-defined, cerebrovascular disease   . Allergy   . Anemia   . 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)   . Anxiety   . Asthma   . Asthma    moderate persistent  . Carpal tunnel syndrome   . Cellulitis and abscess of trunk   . Cholelithiasis 07/13/2014  . Chronic headaches   . Debility, unspecified   . Dermatophytosis of the body   . Dysrhythmia    history of  . Edema   . End stage renal disease on dialysis [facility])    "MWF; [facility]" (10/21/2014)  . Essential hypertension, benign   . GERD (gastroesophageal reflux disease)   . Gout, unspecified   . HTN (hypertension)   . Hypertrophy of prostate without urinary obstruction and other lower urinary tract symptoms (LUTS)   . Hypotension, unspecified   . Impotence of organic origin   . Insomnia, unspecified   . Kidney replaced by transplant   . Localization-related (focal) (partial) epilepsy and epileptic syndromes with complex partial seizures, without mention of intractable epilepsy   . Lumbago   . Memory loss   . OSA on CPAP   . Other and unspecified hyperlipidemia   . Other chronic nonalcoholic liver disease   . Other malaise and fatigue   . Other nonspecific abnormal serum enzyme levels   . Pain in joint, lower leg   . Pain in joint, upper arm   . Pneumonia "several times"  . Renal dialysis  status(V45.11) 02/05/2010   restarted 01/02/13 ofter renal trransplant failure  . Secondary hyperparathyroidism (of renal origin)   . Shortness of breath   . Sleep apnea   . Tension headache   . Unspecified constipation   . Unspecified essential hypertension   . Unspecified hereditary and idiopathic peripheral neuropathy   . Unspecified vitamin D deficiency     Past Surgical History:  Procedure Laterality Date  . AV FISTULA PLACEMENT Left ?2010   "forearm; at [facility]"  . BACK SURGERY    . CARDIAC CATHETERIZATION  03/21/2011  . CHOLECYSTECTOMY N/A 10/21/2014   Procedure: LAPAROSCOPIC CHOLECYSTECTOMY WITH INTRAOPERATIVE CHOLANGIOGRAM;  Surgeon: Autumn Messing III, MD;  Location: [place];  Service: General;  Laterality: N/A;  . COLONOSCOPY    . 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  . KIDNEY TRANSPLANT  08/17/2011   [place]  10/21/2014   w/IOC  . LEFT HEART CATHETERIZATION WITH CORONARY ANGIOGRAM N/A 03/21/2011   Procedure: LEFT HEART CATHETERIZATION WITH CORONARY ANGIOGRAM;  Surgeon: [name], MD;  Location: [facility] C[facility] LAB;  Service: Cardiovascular;  Laterality: N/A;  . NEPHRECTOMY  08/2013   removed transplaned kidney  . POSTERIOR FUSION CERVICAL SPINE  06/25/2012   for spinal stenosis  . VASECTOMY  2010    FAMHx:  Family History  Adopted: Yes  Problem Relation Age of Onset  . Colon cancer Neg Hx   . Esophageal cancer Neg Hx   . Rectal cancer Neg Hx   . Stomach cancer Neg Hx     SOCHx:   reports that he has been smoking cigarettes. He has a 16.00 pack-year smoking history. He has never used smokeless tobacco. He reports that he does not drink alcohol or use drugs.  ALLERGIES:  Allergies  Allergen Reactions  . Codeine Nausea And Vomiting    ROS: Pertinent items noted in HPI and remainder of comprehensive ROS otherwise negative.  HOME MEDS: Current Outpatient Medications on File Prior to Visit  Medication  Sig Dispense Refill  . acetaminophen (TYLENOL) 500 MG tablet Take 1 tablet (500 mg total) by mouth every 6 (six) hours as needed for moderate pain.    Marland Kitchen albuterol (PROVENTIL HFA;VENTOLIN HFA) 108 (90 Base) MCG/ACT inhaler Inhale 2 puffs into the lungs every 6 (six) hours as needed for wheezing or shortness of breath. 1 Inhaler 0  . amLODipine (NORVASC) 10 MG tablet Take 1 tablet (10 mg total) by mouth daily. 30 tablet 11  . aspirin EC 81 MG tablet Take 81 mg by mouth daily.    Marland Kitchen b complex-vitamin c-folic acid (NEPHRO-VITE) 0.8 MG TABS tablet TAKE 1 TABLET BY MOUTH EVERY DAY 90 tablet 1  . budesonide-formoterol (SYMBICORT) 160-4.5 MCG/ACT inhaler Inhale 2 puffs into the lungs 2 (two) times daily. 1 Inhaler 0  . calcitRIOL (ROCALTROL) 0.5 MCG capsule Take 2 capsules (1 mcg total) by mouth every Monday, Wednesday, and Friday with hemodialysis. 60 capsule 1  . Cholecalciferol (VITAMIN D3) 5000 UNITS TABS Take 5,000 Units by mouth daily.     . cinacalcet (SENSIPAR) 30 MG tablet Take 90 mg by mouth daily with supper.     . ciprofloxacin (CIPRO) 500 MG tablet Take 1 tablet (500 mg total) by mouth daily with breakfast. 14 tablet 0  . clonazePAM (KLONOPIN) 0.5 MG tablet TAKE 1 TABLET (0.5 MG TOTAL) BY MOUTH 2 (TWO) TIMES DAILY AS NEEDED FOR ANXIETY. 60 tablet 0  . diphenhydrAMINE (BENADRYL) 25 MG tablet Take 50 mg by mouth at bedtime.     . famotidine (PEPCID) 20 MG tablet Take 1 tablet (20 mg total) by mouth at bedtime.    Marland Kitchen guaiFENesin (MUCINEX) 600 MG 12 hr tablet Take 1,200 mg by mouth 2 (two) times daily.    . hydrALAZINE (APRESOLINE) 100 MG tablet Take 50 mg by mouth 2 (two) times daily.    Marland Kitchen ipratropium-albuterol (DUONEB) 0.5-2.5 (3) MG/3ML SOLN Take 3 mLs by nebulization every 4 (four) hours as needed (Shortness of breath). 360 mL 0  . isosorbide mononitrate (IMDUR) 30 MG 24 hr tablet Take 1 tablet (30 mg total) by mouth daily. 90 tablet 3  . lidocaine-prilocaine (EMLA) cream Apply 1 application  topically See admin instructions. Apply 1 to 2 hours prior to dialysis on Mondays, Wednesdays, and Fridays.  Cover with saran wrap.    . metoprolol tartrate (LOPRESSOR) 100 MG tablet Take 1 tablet (100 mg total) by mouth 2 (two) times daily. 180 tablet 1  . omeprazole (PRILOSEC) 20 MG capsule TAKE 1 CAPSULE TWICE DAILY BEFORE A MEAL 180 capsule 3  . ondansetron (ZOFRAN ODT) 4 MG disintegrating tablet Take 1 tablet (4 mg total) by mouth every 8 (eight) hours as needed for nausea or vomiting. 20 tablet 1  . pravastatin (PRAVACHOL) 40 MG tablet TAKE 1 TABLET BY MOUTH EVERY DAY IN THE EVENING 90 tablet 1  . PRESCRIPTION MEDICATION Inhale into the lungs at bedtime. CPAP    . traMADol (ULTRAM) 50 MG tablet Take 1 tablet (50 mg total) by mouth daily as needed. 30 tablet 0   No current facility-administered medications on file prior to visit.    LABS/IMAGING: No results found for this or any previous visit (from the past 48 hour(s)). No results found.  LIPID PANEL:    Component Value Date/Time   CHOL 123 09/25/2018 1545   CHOL 195 06/29/2015 1533   TRIG 151 (H) 09/25/2018 1545   HDL 31 (L) 09/25/2018 1545   HDL 17 (L) 06/29/2015 1533   CHOLHDL 4.0 09/25/2018 1545   VLDL 32 01/28/2018 0242   LDLCALC 69 09/25/2018 1545     WEIGHTS: Wt Readings from Last 3 Encounters:  07/01/19 210 lb 6.4 oz (95.4 kg)  06/23/19 207 lb 9.6 oz (94.2 kg)  06/18/19 211 lb (95.7 kg)    VITALS: BP (!) 198/78   Pulse 80   Ht 5\' 7"  (1.702 m)   Wt 210 lb 6.4 oz (95.4 kg)   SpO2 95%   BMI 32.95 kg/m   EXAM: Deferred  EKG: Normal sinus rhythm at 64, voltage criteria for LVH-personally reviewed  ASSESSMENT: 1. Chronic chest pain associated with dialysis 2. Acute systolic congestive heart failure, suspect nonischemic cardiomyopathy-LVEF 40 to 45% (08/2018) -> improved to 55% (04/2019) 3. Low risk Myoview stress test with reduced LVEF to 38%, global hypokinesis (08/2018) 4. Angiographically normal, large  caliber coronary arteries by cath (2013) 5. Uncontrolled hypertension 6. End-stage renal disease on hemodialysis secondary to Alport syndrome 7. Status post failed renal transplant - [facility] 8. 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   Kirk [name] reports intermittent chest pain but hopefully this will improve as he switches dialysis centers.  It always is associated with that it may be related to hypotension or drawing off too much fluid.  His echo shows improvement of LV function with no regional wall motion abnormalities.  No changes to his current medications.  He did have negative stress testing in 2020.  Overall prognostically this is good for him particularly since he is considering renal transplant.  No changes to his medicines today.  Follow-up with me annually or sooner as necessary.  [name], MD, [facility], [place] Director of the [facility] Lipid [facility]  Cardiovascular [facility] Reduction [facility] Diplomate of the American Board of Clinical Lipidology Attending Cardiologist  Direct Dial: [phone]  Fax: [phone]  Website:  [email or web address] Destinee Taber 07/01/2019, 10:08 PM

## 2019-07-02 ENCOUNTER — Other Ambulatory Visit: Payer: Self-pay

## 2019-07-02 DIAGNOSIS — I10 Essential (primary) hypertension: Secondary | ICD-10-CM

## 2019-07-02 MED ORDER — METOPROLOL TARTRATE 100 MG PO TABS: 100.0000 mg | ORAL_TABLET | Freq: Two times a day (BID) | ORAL | 1 refills | Status: DC

## 2019-07-09 ENCOUNTER — Ambulatory Visit: Payer: Medicare Other | Admitting: Internal Medicine

## 2019-07-15 ENCOUNTER — Encounter (HOSPITAL_COMMUNITY): Payer: Self-pay

## 2019-07-15 ENCOUNTER — Emergency Department (HOSPITAL_COMMUNITY)
Admission: EM | Admit: 2019-07-15 | Discharge: 2019-07-15 | Disposition: A | Discharge status: Home / Self Care | Payer: Medicare Other | Attending: Emergency Medicine | Admitting: Emergency Medicine

## 2019-07-15 DIAGNOSIS — F17210 Nicotine dependence, cigarettes, uncomplicated: Secondary | ICD-10-CM | POA: Diagnosis not present

## 2019-07-15 DIAGNOSIS — I5022 Chronic systolic (congestive) heart failure: Secondary | ICD-10-CM | POA: Diagnosis not present

## 2019-07-15 DIAGNOSIS — Z79899 Other long term (current) drug therapy: Secondary | ICD-10-CM | POA: Insufficient documentation

## 2019-07-15 DIAGNOSIS — N186 End stage renal disease: Secondary | ICD-10-CM | POA: Diagnosis not present

## 2019-07-15 DIAGNOSIS — Z7982 Long term (current) use of aspirin: Secondary | ICD-10-CM | POA: Diagnosis not present

## 2019-07-15 DIAGNOSIS — E1122 Type 2 diabetes mellitus with diabetic chronic kidney disease: Secondary | ICD-10-CM | POA: Diagnosis not present

## 2019-07-15 DIAGNOSIS — Z992 Dependence on renal dialysis: Secondary | ICD-10-CM | POA: Insufficient documentation

## 2019-07-15 DIAGNOSIS — J45909 Unspecified asthma, uncomplicated: Secondary | ICD-10-CM | POA: Insufficient documentation

## 2019-07-15 DIAGNOSIS — K922 Gastrointestinal hemorrhage, unspecified: Secondary | ICD-10-CM | POA: Insufficient documentation

## 2019-07-15 DIAGNOSIS — I132 Hypertensive heart and chronic kidney disease with heart failure and with stage 5 chronic kidney disease, or end stage renal disease: Secondary | ICD-10-CM | POA: Diagnosis not present

## 2019-07-15 DIAGNOSIS — I120 Hypertensive chronic kidney disease with stage 5 chronic kidney disease or end stage renal disease: Secondary | ICD-10-CM | POA: Diagnosis not present

## 2019-07-15 DIAGNOSIS — K625 Hemorrhage of anus and rectum: Secondary | ICD-10-CM | POA: Diagnosis present

## 2019-07-15 LAB — CBC
HCT: 33.2 % — ABNORMAL LOW (ref 39.0–52.0)
Hemoglobin: 10.4 g/dL — ABNORMAL LOW (ref 13.0–17.0)
MCH: 31.8 pg (ref 26.0–34.0)
MCHC: 31.3 g/dL (ref 30.0–36.0)
MCV: 101.5 fL — ABNORMAL HIGH (ref 80.0–100.0)
Platelets: 99 10*3/uL — ABNORMAL LOW (ref 150–400)
RBC: 3.27 MIL/uL — ABNORMAL LOW (ref 4.22–5.81)
RDW: 14.5 % (ref 11.5–15.5)
WBC: 5.3 10*3/uL (ref 4.0–10.5)
nRBC: 0 % (ref 0.0–0.2)

## 2019-07-15 LAB — COMPREHENSIVE METABOLIC PANEL
ALT: 15 U/L (ref 0–44)
AST: 15 U/L (ref 15–41)
Albumin: 3.6 g/dL (ref 3.5–5.0)
Alkaline Phosphatase: 57 U/L (ref 38–126)
Anion gap: 15 (ref 5–15)
BUN: 45 mg/dL — ABNORMAL HIGH (ref 6–20)
CO2: 23 mmol/L (ref 22–32)
Calcium: 9 mg/dL (ref 8.9–10.3)
Chloride: 101 mmol/L (ref 98–111)
Creatinine, Ser: 13.39 mg/dL — ABNORMAL HIGH (ref 0.61–1.24)
GFR calc Af Amer: 4 mL/min — ABNORMAL LOW (ref >60)
GFR calc non Af Amer: 4 mL/min — ABNORMAL LOW (ref >60)
Glucose, Bld: 109 mg/dL — ABNORMAL HIGH (ref 70–99)
Potassium: 5 mmol/L (ref 3.5–5.1)
Sodium: 139 mmol/L (ref 135–145)
Total Bilirubin: 1.1 mg/dL (ref 0.3–1.2)
Total Protein: 6.7 g/dL (ref 6.5–8.1)

## 2019-07-15 LAB — TYPE AND SCREEN
ABO/RH(D): A POS
Antibody Screen: NEGATIVE

## 2019-07-15 LAB — POC OCCULT BLOOD, ED: Fecal Occult Bld: POSITIVE — AB

## 2019-07-15 LAB — HEMOGLOBIN AND HEMATOCRIT, BLOOD
HCT: 33 % — ABNORMAL LOW (ref 39.0–52.0)
Hemoglobin: 10.4 g/dL — ABNORMAL LOW (ref 13.0–17.0)

## 2019-07-15 MED ORDER — LOKELMA 5 G PO PACK: 5.0000 g | PACK | Freq: Every day | ORAL | 0 refills | Status: DC

## 2019-07-15 NOTE — Discharge Instructions (Addendum)
1.  Schedule a follow-up with your gastroenterologist as soon as possible.  You need a repeat colonoscopy. 2.  At this time, it appears that the bleeding you are seeing is coming from an internal hemorrhoid.  If you see significant amounts of bleeding, experience unusual lightheadedness weakness or other concerning symptoms, return to the emergency department immediately. 3.  Continue your regular medications except daily aspirin.  Do not take aspirin for 2 days.  It is important to take your omeprazole as prescribed. 4.  You are scheduled for dialysis tomorrow morning.  Your potassium level is at 5.0.  This is still within normal range.  To avoid any more elevation overnight, go to the pharmacy and pick up your prescription for [facility].  Take 1 packet (5mg ) at dinnertime today.

## 2019-07-15 NOTE — ED Notes (Signed)
Patient verbalizes understanding of discharge instructions. Opportunity for questioning and answers were provided. Pt discharged from ED. 

## 2019-07-15 NOTE — ED Triage Notes (Signed)
Pt reports 7-8 BM last night with bright red blood

## 2019-07-15 NOTE — ED Notes (Signed)
Pt given food and beverage per Dr. Johnney Killian

## 2019-07-15 NOTE — ED Provider Notes (Addendum)
[facility] EMERGENCY DEPARTMENT Provider Note   CSN: [id number] Arrival date & time: 07/15/19  4098     History Chief Complaint  Patient presents with  . GI Bleeding    Kirk [name] is a 59 y.o. male.  HPI Patient has history of ESRD on dialysis.  Last colonoscopy was about 4 years ago.  No acute findings but positive for internal hemorrhoids.  Patient reports last night at about 7 PM he had a bowel movement and saw some bright red blood.  He reports he went on and had another bowel movement that had more dark black looking stool.  He showed his wife.  They were concerned for GI bleeding.  Patient takes a daily aspirin.  No vomiting.  Patient ports he did feel somewhat nauseated.  Patient was due for dialysis today but after calling the nurse was advised to come to the emergency department rather than go to dialysis.  He had regular dialysis session on Monday.  No shortness of breath or swelling of the legs.    Past Medical History:  Diagnosis Date  . Acute edema of lung, unspecified   . Acute, but ill-defined, cerebrovascular disease   . Allergy   . Anemia   . 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)   . Anxiety   . Asthma   . Asthma    moderate persistent  . Carpal tunnel syndrome   . Cellulitis and abscess of trunk   . Cholelithiasis 07/13/2014  . Chronic headaches   . Debility, unspecified   . Dermatophytosis of the body   . Dysrhythmia    history of  . Edema   . End stage renal disease on dialysis [facility])    "MWF; [facility]" (10/21/2014)  . Essential hypertension, benign   . GERD (gastroesophageal reflux disease)   . Gout, unspecified   . HTN (hypertension)   . Hypertrophy of prostate without urinary obstruction and other lower urinary tract symptoms (LUTS)   . Hypotension, unspecified   . Impotence of organic origin   . Insomnia, unspecified   . Kidney replaced by transplant   . Localization-related (focal) (partial)  epilepsy and epileptic syndromes with complex partial seizures, without mention of intractable epilepsy   . Lumbago   . Memory loss   . OSA on CPAP   . Other and unspecified hyperlipidemia   . Other chronic nonalcoholic liver disease   . Other malaise and fatigue   . Other nonspecific abnormal serum enzyme levels   . Pain in joint, lower leg   . Pain in joint, upper arm   . Pneumonia "several times"  . Renal dialysis status(V45.11) 02/05/2010   restarted 01/02/13 ofter renal trransplant failure  . Secondary hyperparathyroidism (of renal origin)   . Shortness of breath   . Sleep apnea   . Tension headache   . Unspecified constipation   . Unspecified essential hypertension   . Unspecified hereditary and idiopathic peripheral neuropathy   . Unspecified vitamin D deficiency     Patient Active Problem List   Diagnosis Date Noted  . Dyspnea and respiratory abnormalities 12/02/2018  . Chronic systolic heart failure ([place]) 09/25/2018  . Moderate persistent asthma without complication 11/91/4782  . Fluid overload 09/17/2018  . Hypertensive urgency 09/17/2018  . Right knee pain 06/02/2018  . Right tibial fracture 06/02/2018  . Cigarette smoker 07/11/2017  . Anxiety 05/30/2017  . Hypokalemia   . 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  . Shunt malfunction 07/03/2016  .  Need for hepatitis C screening test 06/29/2015  . Myalgia 05/17/2015  . 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 Obesity hypoventilation syndrome ([place]) 12/09/2014  . Narcotic drug use 12/09/2014  . Hypersomnia with sleep apnea 12/09/2014  . SCC (squamous cell carcinoma), arm 11/09/2014  . Mild persistent chronic asthma without complication 94/50/3888  . ESRD on dialysis ([place]) 09/09/2014  . Essential hypertension 09/09/2014  . HLD (hyperlipidemia) 09/09/2014  . Pancytopenia ([place]) 04/28/2014  . Thrombocytopenia ([place]) 04/10/2014  . Fungal dermatitis 03/17/2014  . S/p nephrectomy 09/17/2013  . Anemia 09/03/2012  .  Uncontrolled hypertension   . Tension headache   . Memory loss   . Edema   . Thoracic or lumbosacral neuritis or radiculitis 03/27/2012  . Nerve root pain 03/27/2012  . History of kidney transplant 09/10/2011  . Type 2 diabetes mellitus with diabetic nephropathy, without long-term current use of insulin ([place]) 08/30/2011  . Hearing loss 08/16/2011  . Obesity 08/16/2011  . OSA on CPAP 08/05/2011  . GIB (gastrointestinal bleeding) 10/28/2007    Past Surgical History:  Procedure Laterality Date  . AV FISTULA PLACEMENT Left ?2010   "forearm; at [facility]"  . BACK SURGERY    . CARDIAC CATHETERIZATION  03/21/2011  . CHOLECYSTECTOMY N/A 10/21/2014   Procedure: LAPAROSCOPIC CHOLECYSTECTOMY WITH INTRAOPERATIVE CHOLANGIOGRAM;  Surgeon: Autumn Messing III, MD;  Location: [place];  Service: General;  Laterality: N/A;  . COLONOSCOPY    . 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 KIDNEY TRANSPLANT  08/17/2011   [place]  10/21/2014   w/IOC  . LEFT HEART CATHETERIZATION WITH CORONARY ANGIOGRAM N/A 03/21/2011   Procedure: LEFT HEART CATHETERIZATION WITH CORONARY ANGIOGRAM;  Surgeon: [name], MD;  Location: [facility] CATH LAB;  Service: Cardiovascular;  Laterality: N/A;  . NEPHRECTOMY  08/2013   removed transplaned kidney  . POSTERIOR FUSION CERVICAL SPINE  06/25/2012   for spinal stenosis  . VASECTOMY  2010       Family History  Adopted: Yes  Problem Relation Age of Onset  . Colon cancer Neg Hx   . Esophageal cancer Neg Hx   . Rectal cancer Neg Hx   . Stomach cancer Neg Hx     Social History   Tobacco Use  . Smoking status: Current Every Day Smoker    Packs/day: 0.50    Years: 32.00    Pack years: 16.00    Types: Cigarettes  . Smokeless tobacco: Never Used  . Tobacco comment: 3 cigs per day 01/08/2019   Vaping Use  . Vaping Use: Never used  Substance Use Topics  . Alcohol use: No    Alcohol/week: 0.0 standard drinks  .  Drug use: No    Home Medications Prior to Admission medications   Medication Sig Start Date End Date Taking? Authorizing Provider  acetaminophen (TYLENOL) 500 MG tablet Take 1 tablet (500 mg total) by mouth every 6 (six) hours as needed for moderate pain. 09/12/14  Yes Samuella Cota, MD  albuterol (PROVENTIL HFA;VENTOLIN HFA) 108 (90 Base) MCG/ACT inhaler Inhale 2 puffs into the lungs every 6 (six) hours as needed for wheezing or shortness of breath. 06/05/17  Yes Patrecia Pour, MD  amLODipine (NORVASC) 10 MG tablet Take 1 tablet (10 mg total) by mouth daily. 01/20/19  Yes [name], MD  aspirin EC 81 MG tablet Take 81 mg by mouth daily.   Yes [provider]  b complex-vitamin c-folic  acid (NEPHRO-VITE) 0.8 MG TABS tablet TAKE 1 TABLET BY MOUTH EVERY DAY Patient taking differently: Take 1 tablet by mouth daily.  05/29/18  Yes Reed, Tiffany L, DO  budesonide-formoterol (SYMBICORT) 160-4.5 MCG/ACT inhaler Inhale 2 puffs into the lungs 2 (two) times daily. 10/09/18  Yes Tanda Rockers, MD  calcitRIOL (ROCALTROL) 0.5 MCG capsule Take 2 capsules (1 mcg total) by mouth every Monday, Wednesday, and Friday with hemodialysis. 06/04/18  Yes Black, Lezlie Octave, NP  Cholecalciferol (VITAMIN D3) 5000 UNITS TABS Take 5,000 Units by mouth daily.    Yes [provider]  cinacalcet (SENSIPAR) 90 MG tablet Take 90 mg by mouth every evening.   Yes [provider]  clonazePAM (KLONOPIN) 0.5 MG tablet TAKE 1 TABLET (0.5 MG TOTAL) BY MOUTH 2 (TWO) TIMES DAILY AS NEEDED FOR ANXIETY. 04/30/19  Yes Reed, Tiffany L, DO  diphenhydrAMINE (BENADRYL) 25 MG tablet Take 50 mg by mouth at bedtime.    Yes [provider]  famotidine (PEPCID) 20 MG tablet Take 1 tablet (20 mg total) by mouth at bedtime. 06/23/19  Yes Milus Banister, MD  guaiFENesin (MUCINEX) 600 MG 12 hr tablet Take 1,200 mg by mouth 2 (two) times daily.   Yes [provider]  hydrALAZINE (APRESOLINE) 100 MG tablet  Take 100 mg by mouth in the morning and at bedtime.    Yes [provider]  ipratropium-albuterol (DUONEB) 0.5-2.5 (3) MG/3ML SOLN Take 3 mLs by nebulization every 4 (four) hours as needed (Shortness of breath). Patient taking differently: Take 3 mLs by nebulization every 4 (four) hours as needed (for shortness of breath).  12/05/18  Yes Mariel Aloe, MD  isosorbide mononitrate (IMDUR) 30 MG 24 hr tablet Take 1 tablet (30 mg total) by mouth daily. Patient taking differently: Take 30 mg by mouth every evening.  10/21/18 07/15/19 Yes Hilty, Nadean Corwin, MD  lidocaine-prilocaine (EMLA) cream Apply 1 application topically See admin instructions. Apply 1 to 2 hours prior to dialysis on Mondays, Wednesdays, and Fridays. Cover with saran wrap. 11/18/18  Yes [provider]  metoprolol tartrate (LOPRESSOR) 100 MG tablet Take 1 tablet (100 mg total) by mouth 2 (two) times daily. 07/02/19 07/01/20 Yes Hilty, Nadean Corwin, MD  omeprazole (PRILOSEC) 20 MG capsule TAKE 1 CAPSULE TWICE DAILY BEFORE A MEAL Patient taking differently: Take 20 mg by mouth 2 (two) times daily before a meal.  03/18/18  Yes Milus Banister, MD  ondansetron (ZOFRAN ODT) 4 MG disintegrating tablet Take 1 tablet (4 mg total) by mouth every 8 (eight) hours as needed for nausea or vomiting. Patient taking differently: Take 4 mg by mouth every 8 (eight) hours as needed for nausea or vomiting (DISSOLVE IN THE MOUTH).  06/29/19  Yes Reed, Tiffany L, DO  pravastatin (PRAVACHOL) 40 MG tablet TAKE 1 TABLET BY MOUTH EVERY DAY IN THE EVENING Patient taking differently: Take 40 mg by mouth at bedtime.  04/30/19  Yes Reed, Tiffany L, DO  PRESCRIPTION MEDICATION See admin instructions. CPAP- At bedtime and during any time of rest   Yes [provider]  RENVELA 800 MG tablet Take 800-2,400 mg by mouth See admin instructions. Take 2,400 mg by mouth three times a day with meals and 800 mg with each snack 07/06/19  Yes [provider]  traMADol (ULTRAM) 50 MG tablet Take 1 tablet (50 mg total) by mouth daily as needed. Patient taking differently: Take 50 mg by mouth 2 (two) times daily as needed (for  pain).  06/18/19  Yes Reed, Tiffany L, DO  ciprofloxacin (CIPRO) 500 MG tablet Take 1 tablet (500 mg total) by mouth daily with breakfast. Patient not taking: Reported on 07/15/2019 06/18/19   Hollace Kinnier L, DO    Allergies    Codeine  Review of Systems   Review of Systems 10 systems reviewed and negative except as per HPI. Physical Exam Updated Vital Signs BP (!) 176/77   Pulse 70   Temp 98.1 F (36.7 C) (Oral)   Resp 16   SpO2 97%   Physical Exam Constitutional:      Comments: Alert and nontoxic.  Mental status clear.  No respiratory distress.  HENT:     Head: Normocephalic and atraumatic.  Eyes:     Extraocular Movements: Extraocular movements intact.  Cardiovascular:     Rate and Rhythm: Normal rate and regular rhythm.  Pulmonary:     Effort: Pulmonary effort is normal.     Breath sounds: Normal breath sounds.     Comments: Lungs clear no crackles Abdominal:     Comments: Abdomen is soft.  No guarding.  No significant reproducible tenderness.  Genitourinary:    Comments: Perianal area is normal.  No fissures, no blood, no external hemorrhoids.  Digital exam is for enlarged prostate, a palpable nodule within the anal canal, small and turgid.  Likely internal hemorrhoid.  All stool matter retrieved from the canal is yellowish and seedy in appearance.  No visible blood or melena appearance. Musculoskeletal:     Right lower leg: No edema.     Left lower leg: No edema.  Skin:    General: Skin is warm and dry.  Neurological:     General: No focal deficit present.     Mental Status: He is oriented to person, place, and time.     Coordination: Coordination normal.  Psychiatric:        Mood and Affect: Mood normal.     ED Results / Procedures / Treatments   Labs (all labs ordered are listed, but  only abnormal results are displayed) Labs Reviewed  COMPREHENSIVE METABOLIC PANEL - Abnormal; Notable for the following components:      Result Value   Glucose, Bld 109 (*)    BUN 45 (*)    Creatinine, Ser 13.39 (*)    GFR calc non Af Amer 4 (*)    GFR calc Af Amer 4 (*)    All other components within normal limits  CBC - Abnormal; Notable for the following components:   RBC 3.27 (*)    Hemoglobin 10.4 (*)    HCT 33.2 (*)    MCV 101.5 (*)    Platelets 99 (*)    All other components within normal limits  HEMOGLOBIN AND HEMATOCRIT, BLOOD - Abnormal; Notable for the following components:   Hemoglobin 10.4 (*)    HCT 33.0 (*)    All other components within normal limits  POC OCCULT BLOOD, ED - Abnormal; Notable for the following components:   Fecal Occult Bld POSITIVE (*)    All other components within normal limits  TYPE AND SCREEN    EKG None  Radiology No results found.  Procedures Procedures (including critical care time)  Medications Ordered in ED Medications - No data to display  ED Course  I have reviewed the triage vital signs and the nursing notes.  Pertinent labs & imaging results that were available during my care of the patient were reviewed by me and considered in  my medical decision making (see chart for details).    MDM Rules/Calculators/A&P                          Patient presents for concern of GI bleed.  He saw both some bright red blood as well as some darker stool that raised suspicion of melena.  Patient takes daily aspirin but otherwise no blood thinners.  No vomiting occurred.  Patient does not have any history of gastric ulcers or varices.  He had a fairly unremarkable colonoscopy by verbal report 4 years ago with internal hemorrhoids.  On examination patient has normal external anal exam without hemorrhoids but digital exam has a small turgid nodule that I suspect is an internal hemorrhoid.  Stool is yellow and seedy in appearance.  No visible  melanotic appearance or red blood visible.  Occult card does test positive.  2 hemoglobins have been tested 6 hours apart without any change.  Patient does not have positive orthostatic vital signs.  At this time I suspect he had some lower rectal bleeding due to internal hemorrhoids.  He and his wife have been counseled on return precautions and he is already established with gastroenterology with anticipated upcoming colonoscopy.  Patient missed his dialysis session today, his wife has already rescheduled.  He does not show signs of imminent respiratory failure.  Lungs are clear, potassium is at top normal at 5 mmol/L, a prescription for 5 mg of Lokelma to be taken at dinnertime this evening is given.  Patient is scheduled for 6:30 AM dialysis..  At this time patient stable for rescheduled dialysis session at 6:30 AM tomorrow. Final Clinical Impression(s) / ED Diagnoses Final diagnoses:  Lower GI bleed  ESRD (end stage renal disease) on dialysis [facility])    Rx / DC Orders ED Discharge Orders    None       Charlesetta Shanks, MD 07/15/19 1544    Charlesetta Shanks, MD 07/15/19 1551

## 2019-07-16 ENCOUNTER — Telehealth: Payer: Self-pay | Admitting: Gastroenterology

## 2019-07-16 NOTE — Telephone Encounter (Signed)
The pt has some BRBPR after an episode of diarrhea. He does have a history of bleeding hemorrhoids.  He has an appt with Anderson Malta next week and was seen in the ED last night.  The ED did state that it is hemorrhoids and gave him recommendations. He will use prep H and try recticare as well as sitz baths and keep appt for next week.

## 2019-07-19 IMAGING — CR DG CHEST 1V PORT
1 series · 2 of 2 positions shown · non-contrast
Comparison: [DATE] [DATE], [DATE] [DATE] p.m.

CLINICAL DATA: Shortness of breath and cough tonight.

EXAM:
PORTABLE CHEST 1 VIEW

[Series 1: portable · 0.17mm/px · 2 of 2 slices shown]
[im 1/2]
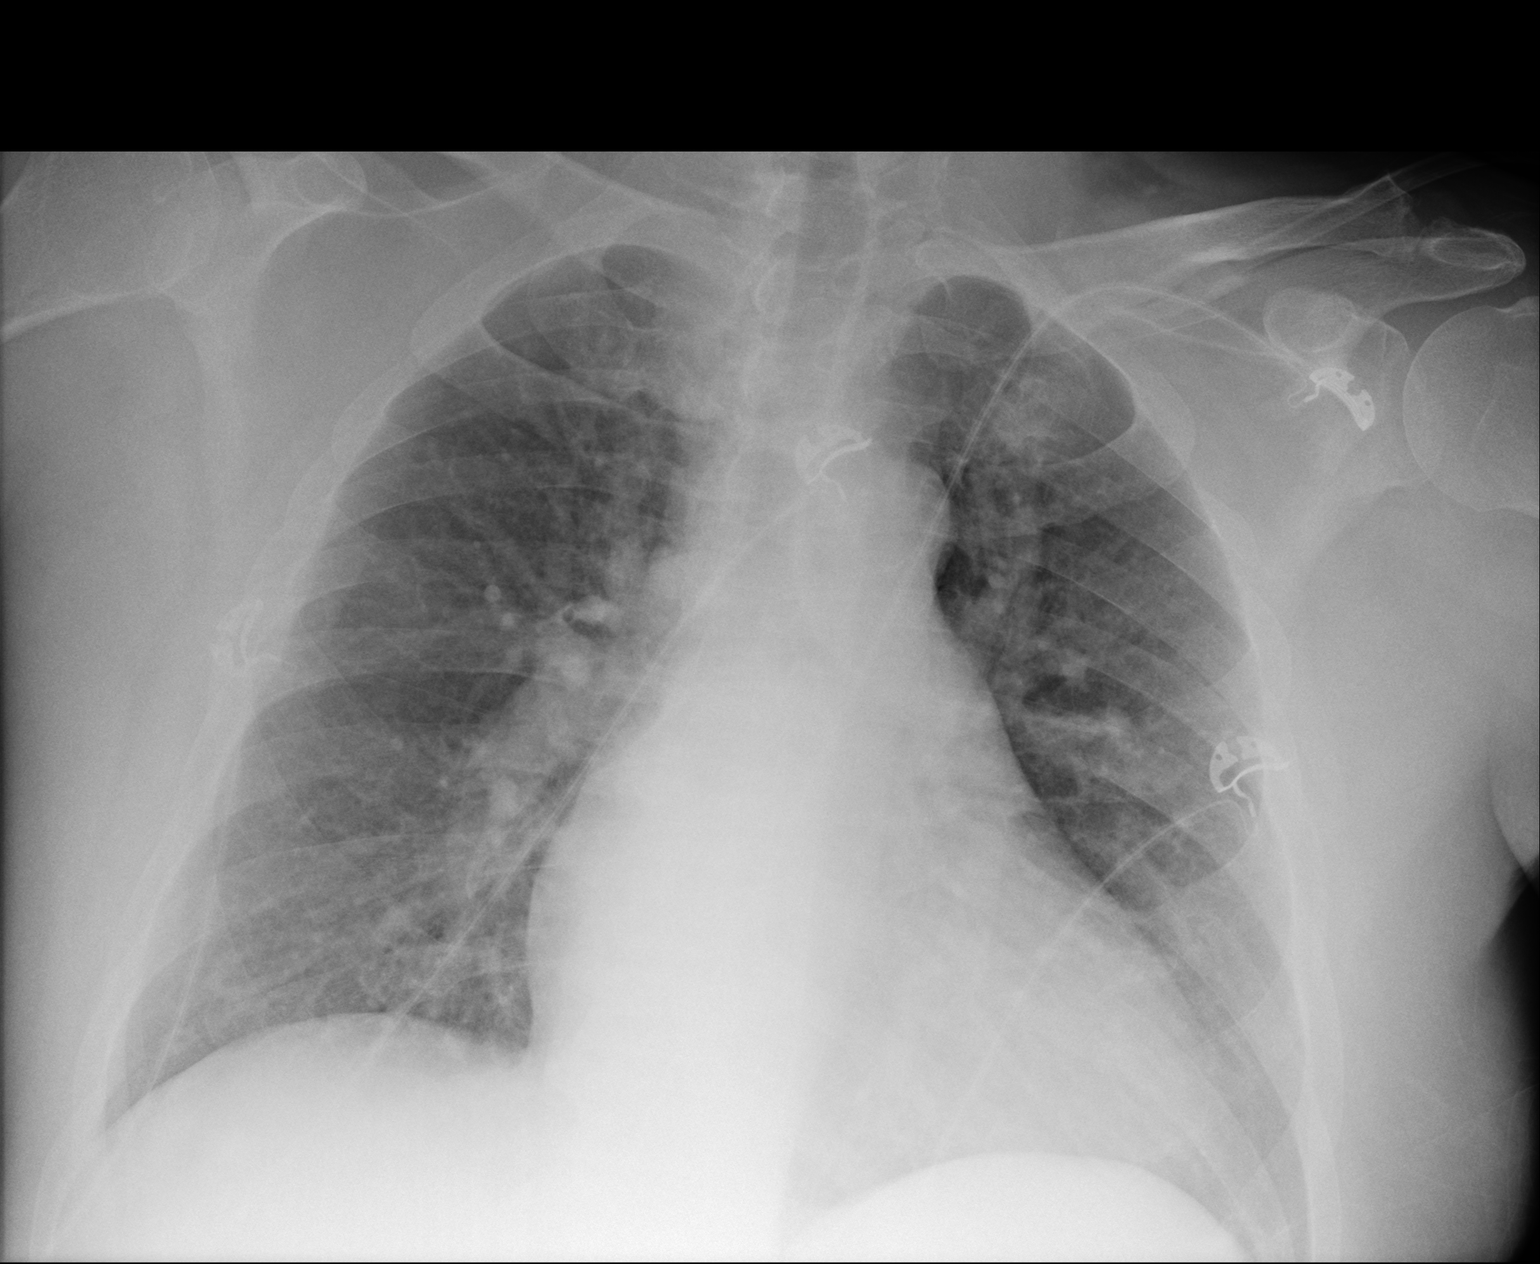
[im 2/2]
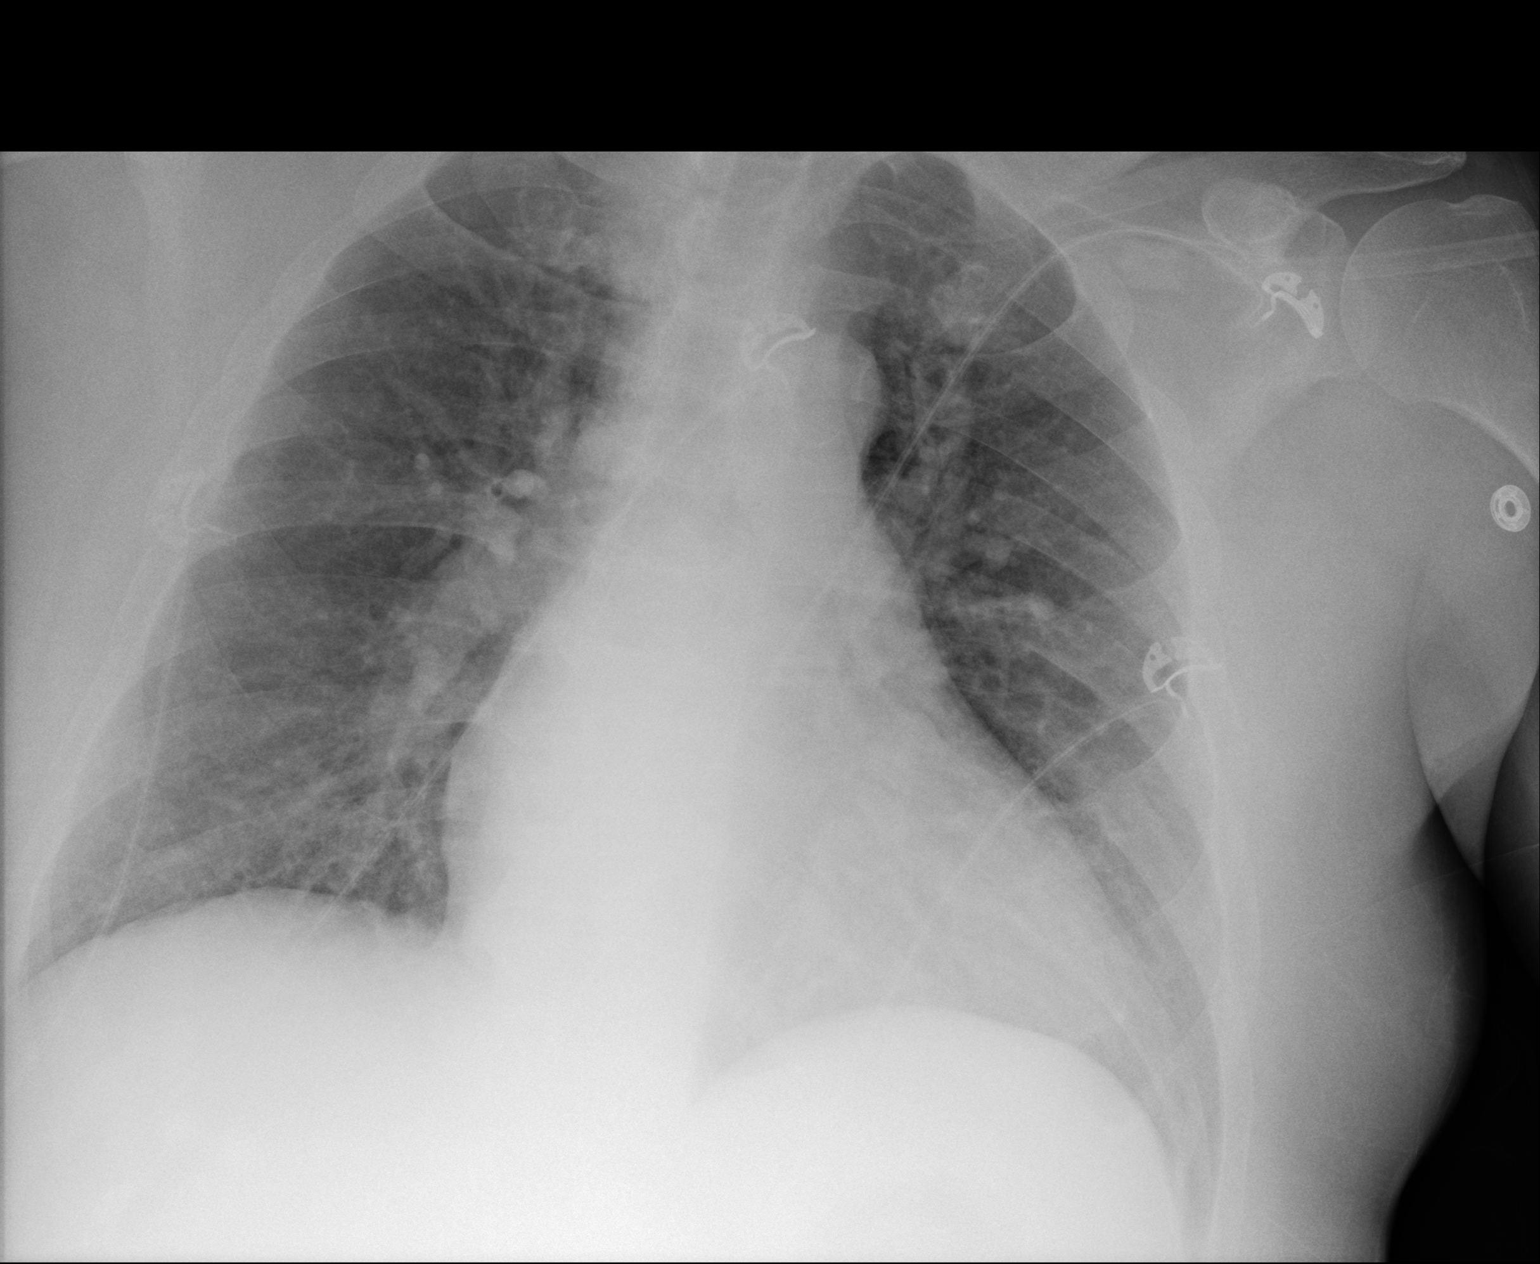

[2 of 2 positions shown; findings below may reference images not displayed]

FINDINGS: The mediastinal contour is normal. The heart size is enlarged. There
is mild interstitial edema. There is no focal pneumonia or pleural
effusion. The osseous structures are stable.
IMPRESSION: Mild congestive heart failure.

## 2019-07-21 ENCOUNTER — Telehealth: Payer: Self-pay | Admitting: Gastroenterology

## 2019-07-21 NOTE — Telephone Encounter (Signed)
Pt's wife called to inform that she was told that her husband's appt was for today 6/29. However, she realized today that it is not until 7/29. She would like some clarification and a sooner appt.

## 2019-07-21 NOTE — Telephone Encounter (Signed)
I spoke with the pt wife and advised her that the appt is 7/29 NOT 6/29.  She wanted a sooner appt and while looking for a sooner appt she states that she would like the appt for 7/29 cancelled and not worry about another one.  I have cancelled the appt for 7/29.

## 2019-07-23 DIAGNOSIS — T8612 Kidney transplant failure: Secondary | ICD-10-CM | POA: Diagnosis not present

## 2019-07-23 DIAGNOSIS — Z992 Dependence on renal dialysis: Secondary | ICD-10-CM | POA: Diagnosis not present

## 2019-07-23 DIAGNOSIS — N186 End stage renal disease: Secondary | ICD-10-CM | POA: Diagnosis not present

## 2019-07-24 DIAGNOSIS — D509 Iron deficiency anemia, unspecified: Secondary | ICD-10-CM | POA: Diagnosis not present

## 2019-07-24 DIAGNOSIS — N186 End stage renal disease: Secondary | ICD-10-CM | POA: Diagnosis not present

## 2019-07-24 DIAGNOSIS — D631 Anemia in chronic kidney disease: Secondary | ICD-10-CM | POA: Diagnosis not present

## 2019-07-24 DIAGNOSIS — Z992 Dependence on renal dialysis: Secondary | ICD-10-CM | POA: Diagnosis not present

## 2019-07-24 DIAGNOSIS — N2581 Secondary hyperparathyroidism of renal origin: Secondary | ICD-10-CM | POA: Diagnosis not present

## 2019-07-27 DIAGNOSIS — N186 End stage renal disease: Secondary | ICD-10-CM | POA: Diagnosis not present

## 2019-07-27 DIAGNOSIS — D631 Anemia in chronic kidney disease: Secondary | ICD-10-CM | POA: Diagnosis not present

## 2019-07-27 DIAGNOSIS — Z992 Dependence on renal dialysis: Secondary | ICD-10-CM | POA: Diagnosis not present

## 2019-07-27 DIAGNOSIS — D509 Iron deficiency anemia, unspecified: Secondary | ICD-10-CM | POA: Diagnosis not present

## 2019-07-27 DIAGNOSIS — N2581 Secondary hyperparathyroidism of renal origin: Secondary | ICD-10-CM | POA: Diagnosis not present

## 2019-07-29 DIAGNOSIS — N2581 Secondary hyperparathyroidism of renal origin: Secondary | ICD-10-CM | POA: Diagnosis not present

## 2019-07-29 DIAGNOSIS — N186 End stage renal disease: Secondary | ICD-10-CM | POA: Diagnosis not present

## 2019-07-29 DIAGNOSIS — Z992 Dependence on renal dialysis: Secondary | ICD-10-CM | POA: Diagnosis not present

## 2019-07-29 DIAGNOSIS — D509 Iron deficiency anemia, unspecified: Secondary | ICD-10-CM | POA: Diagnosis not present

## 2019-07-29 DIAGNOSIS — D631 Anemia in chronic kidney disease: Secondary | ICD-10-CM | POA: Diagnosis not present

## 2019-07-30 ENCOUNTER — Other Ambulatory Visit: Payer: Self-pay | Admitting: Internal Medicine

## 2019-07-30 DIAGNOSIS — N186 End stage renal disease: Secondary | ICD-10-CM

## 2019-07-31 DIAGNOSIS — Z992 Dependence on renal dialysis: Secondary | ICD-10-CM | POA: Diagnosis not present

## 2019-07-31 DIAGNOSIS — D509 Iron deficiency anemia, unspecified: Secondary | ICD-10-CM | POA: Diagnosis not present

## 2019-07-31 DIAGNOSIS — D631 Anemia in chronic kidney disease: Secondary | ICD-10-CM | POA: Diagnosis not present

## 2019-07-31 DIAGNOSIS — N2581 Secondary hyperparathyroidism of renal origin: Secondary | ICD-10-CM | POA: Diagnosis not present

## 2019-07-31 DIAGNOSIS — N186 End stage renal disease: Secondary | ICD-10-CM | POA: Diagnosis not present

## 2019-08-03 ENCOUNTER — Other Ambulatory Visit: Payer: Self-pay | Admitting: Internal Medicine

## 2019-08-03 DIAGNOSIS — D509 Iron deficiency anemia, unspecified: Secondary | ICD-10-CM | POA: Diagnosis not present

## 2019-08-03 DIAGNOSIS — N2581 Secondary hyperparathyroidism of renal origin: Secondary | ICD-10-CM | POA: Diagnosis not present

## 2019-08-03 DIAGNOSIS — Z992 Dependence on renal dialysis: Secondary | ICD-10-CM | POA: Diagnosis not present

## 2019-08-03 DIAGNOSIS — N186 End stage renal disease: Secondary | ICD-10-CM | POA: Diagnosis not present

## 2019-08-03 DIAGNOSIS — D631 Anemia in chronic kidney disease: Secondary | ICD-10-CM | POA: Diagnosis not present

## 2019-08-03 MED ORDER — HYDRALAZINE HCL 100 MG PO TABS: 100.0000 mg | ORAL_TABLET | Freq: Two times a day (BID) | ORAL | 3 refills | Status: DC

## 2019-08-04 ENCOUNTER — Other Ambulatory Visit: Payer: Self-pay

## 2019-08-04 ENCOUNTER — Encounter: Payer: Self-pay | Admitting: Family

## 2019-08-04 ENCOUNTER — Ambulatory Visit (INDEPENDENT_AMBULATORY_CARE_PROVIDER_SITE_OTHER): Payer: Medicare Other | Admitting: Family

## 2019-08-04 VITALS — BP 148/80 | HR 63 | Temp 97.8°F | Resp 16 | Ht 67.0 in | Wt 207.6 lb

## 2019-08-04 DIAGNOSIS — M170 Bilateral primary osteoarthritis of knee: Secondary | ICD-10-CM

## 2019-08-04 DIAGNOSIS — F419 Anxiety disorder, unspecified: Secondary | ICD-10-CM

## 2019-08-04 DIAGNOSIS — H9313 Tinnitus, bilateral: Secondary | ICD-10-CM

## 2019-08-04 MED ORDER — CLONAZEPAM 0.5 MG PO TABS: 0.5000 mg | ORAL_TABLET | Freq: Two times a day (BID) | ORAL | 0 refills | Status: DC | PRN

## 2019-08-04 MED ORDER — TRAMADOL HCL 50 MG PO TABS: 50.0000 mg | ORAL_TABLET | Freq: Every day | ORAL | 0 refills | Status: DC | PRN

## 2019-08-04 NOTE — Progress Notes (Signed)
Provider: Amily Depp FNP-C  Gayland Curry, DO  Patient Care Team: Gayland Curry, DO as PCP - General (Geriatric Medicine) [name], MD as PCP - Cardiology (Cardiology) Jovita Kussmaul, MD as Consulting Physician (General Surgery) [name], MD as Consulting Physician (Pulmonary Disease) Estanislado Emms, MD as Consulting Physician (Nephrology) Elam Dutch, MD as Consulting Physician (Vascular Surgery) [name], MD as Consulting Physician (Cardiology) Milus Banister, MD as Attending Physician (Gastroenterology)  Extended Emergency Contact Information Primary Emergency Contact: [facility] Address: [address]          [place], [place] [address] Johnnette Litter of [place] Phone: [phone] Mobile Phone: [phone] Relation: Spouse  Code Status: Full Code  Goals of care: Advanced Directive information Advanced Directives 08/04/2019  Does Patient Have a Medical Advance Directive? No  Does patient want to make changes to medical advance directive? No - Patient declined  Would patient like information on creating a medical advance directive? -  Pre-existing out of facility DNR order (yellow form or pink MOST form) -     Chief Complaint  Patient presents with   Acute Visit    Patient has ringing in ears 1.5-2weeks.     HPI:  Pt is a 59 y.o. 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)    Anxiety    Asthma    Asthma    moderate  persistent   Carpal tunnel syndrome    Cellulitis and abscess of trunk    Cholelithiasis 07/13/2014   Chronic headaches    Debility, unspecified    Dermatophytosis of the body    Dysrhythmia    history of   Edema    End stage renal disease on dialysis [facility])    "MWF; [facility] in [place]" (10/21/2014)   Essential hypertension, benign    GERD (gastroesophageal reflux disease)    Gout, unspecified    HTN (hypertension)    Hypertrophy of prostate without urinary obstruction and other lower urinary tract symptoms (LUTS)    Hypotension, unspecified    Impotence of organic origin    Insomnia, unspecified    Kidney replaced by transplant    Localization-related (focal) (partial) epilepsy and epileptic syndromes with complex partial seizures, without mention of intractable epilepsy    Lumbago    Memory loss    OSA on CPAP    Other and unspecified hyperlipidemia    Other chronic nonalcoholic liver disease    Other malaise and fatigue    Other nonspecific abnormal serum enzyme levels    Pain in joint, lower leg    Pain in joint, upper arm    Pneumonia "several times"   Renal dialysis status(V45.11) 02/05/2010   restarted 01/02/13 ofter renal trransplant failure   Secondary hyperparathyroidism (of renal origin)    Shortness of breath    Sleep apnea    Tension headache    Unspecified constipation    Unspecified essential hypertension    Unspecified hereditary and idiopathic peripheral neuropathy  Unspecified vitamin D deficiency    Past Surgical History:  Procedure Laterality Date   AV FISTULA PLACEMENT Left ?2010   "forearm; at [place]"   [place]  03/21/2011   CHOLECYSTECTOMY N/A 10/21/2014   Procedure: LAPAROSCOPIC CHOLECYSTECTOMY WITH INTRAOPERATIVE CHOLANGIOGRAM;  Surgeon: Autumn Messing III, MD;  Location: [facility];  Service: General;  Laterality: N/A;   COLONOSCOPY     INNER EAR  SURGERY 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   KIDNEY TRANSPLANT  08/17/2011   [place]  10/21/2014   w/IOC   LEFT HEART CATHETERIZATION WITH CORONARY ANGIOGRAM N/A 03/21/2011   Procedure: LEFT HEART CATHETERIZATION WITH CORONARY ANGIOGRAM;  Surgeon: [name], MD;  Location: [facility] CATH LAB;  Service: Cardiovascular;  Laterality: N/A;   NEPHRECTOMY  08/2013   removed transplaned kidney   POSTERIOR FUSION CERVICAL SPINE  06/25/2012   for spinal stenosis   VASECTOMY  2010    Allergies  Allergen Reactions   Codeine Nausea And Vomiting    Outpatient Encounter Medications as of 08/04/2019  Medication Sig   acetaminophen (TYLENOL) 500 MG tablet Take 1 tablet (500 mg total) by mouth every 6 (six) hours as needed for moderate pain.   albuterol (PROVENTIL HFA;VENTOLIN HFA) 108 (90 Base) MCG/ACT inhaler Inhale 2 puffs into the lungs every 6 (six) hours as needed for wheezing or shortness of breath.   amLODipine (NORVASC) 10 MG tablet Take 1 tablet (10 mg total) by mouth daily.   aspirin EC 81 MG tablet Take 81 mg by mouth daily.   b complex-vitamin c-folic acid (NEPHRO-VITE) 0.8 MG TABS tablet TAKE 1 TABLET BY MOUTH EVERY DAY   budesonide-formoterol (SYMBICORT) 160-4.5 MCG/ACT inhaler Inhale 2 puffs into the lungs 2 (two) times daily.   calcitRIOL (ROCALTROL) 0.5 MCG capsule Take 2 capsules (1 mcg total) by mouth every Monday, Wednesday, and Friday with hemodialysis.   Cholecalciferol (VITAMIN D3) 5000 UNITS TABS Take 5,000 Units by mouth daily.    cinacalcet (SENSIPAR) 90 MG tablet Take 90 mg by mouth every evening.   clonazePAM (KLONOPIN) 0.5 MG tablet Take 1 tablet (0.5 mg total) by mouth 2 (two) times daily as needed for anxiety.   diphenhydrAMINE (BENADRYL) 25 MG tablet Take 50 mg by mouth at bedtime.    famotidine (PEPCID) 20 MG tablet Take 1 tablet (20 mg total) by mouth at bedtime.   guaiFENesin (MUCINEX) 600 MG 12 hr tablet Take 1,200  mg by mouth 2 (two) times daily.   hydrALAZINE (APRESOLINE) 100 MG tablet Take 1 tablet (100 mg total) by mouth in the morning and at bedtime.   ipratropium-albuterol (DUONEB) 0.5-2.5 (3) MG/3ML SOLN Take 3 mLs by nebulization every 4 (four) hours as needed (Shortness of breath).   isosorbide mononitrate (IMDUR) 30 MG 24 hr tablet Take 1 tablet (30 mg total) by mouth daily.   lidocaine-prilocaine (EMLA) cream Apply 1 application topically See admin instructions. Apply 1 to 2 hours prior to dialysis on Mondays, Wednesdays, and Fridays. Cover with saran wrap.   metoprolol tartrate (LOPRESSOR) 100 MG tablet Take 1 tablet (100 mg total) by mouth 2 (two) times daily.   omeprazole (PRILOSEC) 20 MG capsule TAKE 1 CAPSULE TWICE DAILY BEFORE A MEAL   ondansetron (ZOFRAN ODT) 4 MG disintegrating tablet Take 1 tablet (4 mg total) by mouth every 8 (eight) hours as needed for nausea or vomiting.   pravastatin (PRAVACHOL) 40 MG tablet TAKE 1 TABLET  BY MOUTH EVERY DAY IN THE EVENING   PRESCRIPTION MEDICATION See admin instructions. CPAP- At bedtime and during any time of rest   RENVELA 800 MG tablet Take 800-2,400 mg by mouth See admin instructions. Take 2,400 mg by mouth three times a day with meals and 800 mg with each snack   sodium zirconium cyclosilicate (LOKELMA) 5 g packet Take 5 g by mouth daily.   traMADol (ULTRAM) 50 MG tablet Take 1 tablet (50 mg total) by mouth daily as needed.   [DISCONTINUED] clonazePAM (KLONOPIN) 0.5 MG tablet TAKE 1 TABLET (0.5 MG TOTAL) BY MOUTH 2 (TWO) TIMES DAILY AS NEEDED FOR ANXIETY.   [DISCONTINUED] traMADol (ULTRAM) 50 MG tablet Take 1 tablet (50 mg total) by mouth daily as needed.   [DISCONTINUED] ciprofloxacin (CIPRO) 500 MG tablet Take 1 tablet (500 mg total) by mouth daily with breakfast. (Patient not taking: Reported on 07/15/2019)   No facility-administered encounter medications on file as of 08/04/2019.    Review of Systems  Constitutional: Negative  for appetite change, chills, fatigue and fever.  HENT: Negative for congestion, postnasal drip, rhinorrhea, sinus pressure, sinus pain, sneezing, sore throat and trouble swallowing.   Eyes: Negative for discharge, redness and itching.  Respiratory: Negative for cough, chest tightness, shortness of breath and wheezing.   Cardiovascular: Negative for chest pain, palpitations and leg swelling.  Skin: Negative for color change, pallor, rash and wound.  Neurological: Negative for dizziness, speech difficulty, light-headedness, numbness and headaches.  Psychiatric/Behavioral: Negative for agitation and sleep disturbance. The patient is not nervous/anxious.     Immunization History  Administered Date(s) Administered   Influenza Whole 11/01/2010   Influenza, Seasonal, Injecte, Preservative Fre 10/29/2012   Influenza-Unspecified 11/21/2011, 11/05/2012, 09/22/2013, 09/22/2017   Moderna SARS-COVID-2 Vaccination 04/06/2019, 05/07/2019   PPD Test 12/26/2012   Pneumococcal Polysaccharide-23 11/01/2010, 10/29/2012   Pneumococcal-Unspecified 11/05/2012   Tdap 01/22/2006   Pertinent  Health Maintenance Due  Topic Date Due   OPHTHALMOLOGY EXAM  Never done   FOOT EXAM  04/29/2014   HEMOGLOBIN A1C  03/25/2019   INFLUENZA VACCINE  08/23/2019   COLONOSCOPY  08/30/2019   Fall Risk  08/04/2019 06/18/2019 02/05/2019 09/25/2018 05/22/2018  Falls in the past year? 0 0 - 1 0  Number falls in past yr: 0 0 1 1 0  Injury with Fall? 0 0 1 0 0  Comment - - legs scratch - -    Vitals:   08/04/19 1559  BP: (!) 148/80  Pulse: 63  Resp: 16  Temp: 97.8 F (36.6 C)  SpO2: 97%  Weight: 207 lb 9.6 oz (94.2 kg)  Height: 5\' 7"  (1.702 m)   Body mass index is 32.51 kg/m. Physical Exam Vitals reviewed.  Constitutional:      General: He is not in acute distress.    Appearance: He is obese. He is not ill-appearing.  HENT:     Head: Normocephalic.     Right Ear: Tympanic membrane, ear canal and  external ear normal. There is no impacted cerumen.     Left Ear: Tympanic membrane, ear canal and external ear normal. There is no impacted cerumen.     Nose: Nose normal. No congestion or rhinorrhea.     Mouth/Throat:     Mouth: Mucous membranes are moist.     Pharynx: Oropharynx is clear. No oropharyngeal exudate or posterior oropharyngeal erythema.  Eyes:     General: No scleral icterus.       Right eye: No discharge.  Left eye: No discharge.     Extraocular Movements: Extraocular movements intact.     Conjunctiva/sclera: Conjunctivae normal.     Pupils: Pupils are equal, round, and reactive to light.  Cardiovascular:     Rate and Rhythm: Normal rate and regular rhythm.     Pulses: Normal pulses.     Heart sounds: Normal heart sounds. No murmur heard.  No friction rub. No gallop.   Pulmonary:     Effort: Pulmonary effort is normal. No respiratory distress.     Breath sounds: Normal breath sounds. No wheezing, rhonchi or rales.  Chest:     Chest wall: No tenderness.  Abdominal:     General: Bowel sounds are normal. There is no distension.     Palpations: Abdomen is soft. There is no mass.     Tenderness: There is no abdominal tenderness. There is no right CVA tenderness, left CVA tenderness, guarding or rebound.  Musculoskeletal:     Cervical back: Normal range of motion. No tenderness.  Lymphadenopathy:     Cervical: No cervical adenopathy.  Skin:    General: Skin is warm.     Coloration: Skin is not pale.     Findings: No bruising, erythema or rash.  Neurological:     Mental Status: He is alert and oriented to person, place, and time.     Cranial Nerves: No cranial nerve deficit.     Sensory: No sensory deficit.     Motor: No weakness.     Coordination: Coordination normal.     Gait: Gait normal.     Comments: HOH  Psychiatric:        Mood and Affect: Mood normal.        Behavior: Behavior normal.        Thought Content: Thought content normal.         Judgment: Judgment normal.     Labs reviewed: Recent Labs    09/18/18 0459 09/19/18 0403 12/03/18 0333 12/03/18 1910 07/15/19 0704  NA 138   < > 139 136 139  K 4.3   < > 3.9 4.1 5.0  CL 97*   < > 98 95* 101  CO2 28   < > 24 23 23   GLUCOSE 115*   < > 181* 112* 109*  BUN 33*   < > 53* 29* 45*  CREATININE 9.63*   < > 13.84* 9.13* 13.39*  CALCIUM 8.6*   < > 8.6* 9.2 9.0  PHOS 6.3*  --   --  5.4*  --    < > = values in this interval not displayed.   Recent Labs    12/02/18 2031 12/02/18 2031 12/03/18 0333 12/03/18 1910 07/15/19 0704  AST 20  --  16  --  15  ALT 16  --  14  --  15  ALKPHOS 91  --  81  --  57  BILITOT 0.9  --  0.5  --  1.1  PROT 7.6  --  6.8  --  6.7  ALBUMIN 4.0   < > 3.4* 4.0 3.6   < > = values in this interval not displayed.   Recent Labs    09/19/18 0403 09/19/18 0403 12/02/18 2031 12/02/18 2052 12/03/18 0333 12/03/18 0333 12/03/18 1910 07/15/19 0704 07/15/19 1431  WBC 3.7*   < > 6.9   < > 6.0  --  6.3 5.3  --   NEUTROABS 2.4  --  5.5  --   --   --   --   --   --  HGB 8.9*   < > 13.8   < > 11.9*   < > 13.6 10.4* 10.4*  HCT 28.0*   < > 44.1   < > 37.8*   < > 43.4 33.2* 33.0*  MCV 101.8*   < > 95.5   < > 95.7  --  94.3 101.5*  --   PLT 101*   < > 98*   < > 89*  --  86* 99*  --    < > = values in this interval not displayed.   Lab Results  Component Value Date   TSH 0.787 01/28/2018   Lab Results  Component Value Date   HGBA1C 5.4 09/25/2018   Lab Results  Component Value Date   CHOL 123 09/25/2018   HDL 31 (L) 09/25/2018   LDLCALC 69 09/25/2018   TRIG 151 (H) 09/25/2018   CHOLHDL 4.0 09/25/2018    Significant Diagnostic Results in last 30 days:  No results found.  Assessment/Plan 1. Tinnitus of both ears Unclear etiology.Education information provided on AVS.Will refer to ENT for evaluation. - Ambulatory referral to ENT  2. Bilateral primary osteoarthritis of knee Current pain regimen effective.Requires refill for  tramadol. - traMADol (ULTRAM) 50 MG tablet; Take 1 tablet (50 mg total) by mouth daily as needed.  Dispense: 30 tablet; Refill: 0  3. Anxiety Stable on Klonopin.  - clonazePAM (KLONOPIN) 0.5 MG tablet; Take 1 tablet (0.5 mg total) by mouth 2 (two) times daily as needed for anxiety.  Dispense: 60 tablet; Refill: 0 Family/ staff Communication: Reviewed plan of care with patient verbalized understanding.   Labs/tests ordered: None   Next Appointment: Has appointment 10/22/2019 with Dr.Reed   Sandrea Hughs, NP

## 2019-08-04 NOTE — Patient Instructions (Signed)
Tinnitus Tinnitus refers to hearing a sound when there is no actual source for that sound. This is often described as ringing in the ears. However, people with this condition may hear a variety of noises, in one ear or in both ears. The sounds of tinnitus can be soft, loud, or somewhere in between. Tinnitus can last for a few seconds or can be constant for days. It may go away without treatment and come back at various times. When tinnitus is constant or happens often, it can lead to other problems, such as trouble sleeping and trouble concentrating. Almost everyone experiences tinnitus at some point. Tinnitus that is long-lasting (chronic) or comes back often (recurs) may require medical attention. What are the causes? The cause of tinnitus is often not known. In some cases, it can result from:  Exposure to loud noises from machinery, music, or other sources.  An object (foreign body) stuck in the ear.  Earwax buildup.  Drinking alcohol or caffeine.  Taking certain medicines.  Age-related hearing loss. It may also be caused by medical conditions such as:  Ear or sinus infections.  High blood pressure.  Heart diseases.  Anemia.  Allergies.  Meniere's disease.  Thyroid problems.  Tumors.  A weak, bulging blood vessel (aneurysm) near the ear. What are the signs or symptoms? The main symptom of tinnitus is hearing a sound when there is no source for that sound. It may sound like:  Buzzing.  Roaring.  Ringing.  Blowing air.  Hissing.  Whistling.  Sizzling.  Humming.  Running water.  A musical note.  Tapping. Symptoms may affect only one ear (unilateral) or both ears (bilateral). How is this diagnosed? Tinnitus is diagnosed based on your symptoms, your medical history, and a physical exam. Your health care provider may do a thorough hearing test (audiologic exam) if your tinnitus:  Is unilateral.  Causes hearing difficulties.  Lasts 6 months or  longer. You may work with a health care provider who specializes in hearing disorders (audiologist). You may be asked questions about your symptoms and how they affect your daily life. You may have other tests done, such as:  CT scan.  MRI.  An imaging test of how blood flows through your blood vessels (angiogram). How is this treated? Treating an underlying medical condition can sometimes make tinnitus go away. If your tinnitus continues, other treatments may include:  Medicines.  Therapy and counseling to help you manage the stress of living with tinnitus.  Sound generators to mask the tinnitus. These include: ? Tabletop sound machines that play relaxing sounds to help you fall asleep. ? Wearable devices that fit in your ear and play sounds or music. ? Acoustic neural stimulation. This involves using headphones to listen to music that contains an auditory signal. Over time, listening to this signal may change some pathways in your brain and make you less sensitive to tinnitus. This treatment is used for very severe cases when no other treatment is working.  Using hearing aids or cochlear implants if your tinnitus is related to hearing loss. Hearing aids are worn in the outer ear. Cochlear implants are surgically placed in the inner ear. Follow these instructions at home: Managing symptoms      When possible, avoid being in loud places and being exposed to loud sounds.  Wear hearing protection, such as earplugs, when you are exposed to loud noises.  Use a white noise machine, a humidifier, or other devices to mask the sound of tinnitus.    Practice techniques for reducing stress, such as meditation, yoga, or deep breathing. Work with your health care provider if you need help with managing stress.  Sleep with your head slightly raised. This may reduce the impact of tinnitus. General instructions  Do not use stimulants, such as nicotine, alcohol, or caffeine. Talk with your health  care provider about other stimulants to avoid. Stimulants are substances that can make you feel alert and attentive by increasing certain activities in the body (such as heart rate and blood pressure). These substances may make tinnitus worse.  Take over-the-counter and prescription medicines only as told by your health care provider.  Try to get plenty of sleep each night.  Keep all follow-up visits as told by your health care provider. This is important. Contact a health care provider if:  Your tinnitus continues for 3 weeks or longer without stopping.  You develop sudden hearing loss.  Your symptoms get worse or do not get better with home care.  You feel you are not able to manage the stress of living with tinnitus. Get help right away if:  You develop tinnitus after a head injury.  You have tinnitus along with any of the following: ? Dizziness. ? Loss of balance. ? Nausea and vomiting. ? Sudden, severe headache. These symptoms may represent a serious problem that is an emergency. Do not wait to see if the symptoms will go away. Get medical help right away. Call your local emergency services (911 in the [place] Do not drive yourself to the hospital. Summary  Tinnitus refers to hearing a sound when there is no actual source for that sound. This is often described as ringing in the ears.  Symptoms may affect only one ear (unilateral) or both ears (bilateral).  Use a white noise machine, a humidifier, or other devices to mask the sound of tinnitus.  Do not use stimulants, such as nicotine, alcohol, or caffeine. Talk with your health care provider about other stimulants to avoid. These substances may make tinnitus worse. This information is not intended to replace advice given to you by your health care provider. Make sure you discuss any questions you have with your health care provider. Document Revised: 07/23/2018 Document Reviewed: 10/18/2016 Elsevier Patient Education  2020  [employer]  

## 2019-08-05 DIAGNOSIS — D509 Iron deficiency anemia, unspecified: Secondary | ICD-10-CM | POA: Diagnosis not present

## 2019-08-05 DIAGNOSIS — N186 End stage renal disease: Secondary | ICD-10-CM | POA: Diagnosis not present

## 2019-08-05 DIAGNOSIS — D631 Anemia in chronic kidney disease: Secondary | ICD-10-CM | POA: Diagnosis not present

## 2019-08-05 DIAGNOSIS — Z992 Dependence on renal dialysis: Secondary | ICD-10-CM | POA: Diagnosis not present

## 2019-08-05 DIAGNOSIS — N2581 Secondary hyperparathyroidism of renal origin: Secondary | ICD-10-CM | POA: Diagnosis not present

## 2019-08-06 ENCOUNTER — Encounter: Payer: Self-pay | Admitting: Internal Medicine

## 2019-08-06 ENCOUNTER — Ambulatory Visit (INDEPENDENT_AMBULATORY_CARE_PROVIDER_SITE_OTHER): Payer: Medicare Other | Admitting: Internal Medicine

## 2019-08-06 ENCOUNTER — Other Ambulatory Visit: Payer: Self-pay

## 2019-08-06 DIAGNOSIS — F17210 Nicotine dependence, cigarettes, uncomplicated: Secondary | ICD-10-CM

## 2019-08-06 DIAGNOSIS — J4530 Mild persistent asthma, uncomplicated: Secondary | ICD-10-CM | POA: Diagnosis not present

## 2019-08-06 NOTE — Assessment & Plan Note (Addendum)
3 min discussion re active cigarette smoking in addition to office E&M  Ask about tobacco use:   ongoing Advise quitting   I took an extended  opportunity with this patient to outline the consequences of continued cigarette use  in airway disorders based on all the data we have from the multiple national lung health studies (perfomed over decades at millions of dollars in cost)  indicating that smoking cessation, not choice of inhalers or physicians, is the most important aspect of  his care.   Assess willingness:  Not committed at this point Assist in quit attempt:  Per PCP when ready Arrange follow up:   Follow up per Primary Care planned  For smoking cessation classes call [phone]

## 2019-08-06 NOTE — Progress Notes (Addendum)
Subjective:   Patient ID: Kirk [name], male    DOB: 05-07-1960     MRN: [id number]    Brief patient profile:  89 yobm active smoker  referred 10/18/2014 to [name] for eval of ? Copd for clearance for lap chole  with  no airflow obst on spiromtery   10/18/2014 on HD since 2012    History of Present Illness  10/18/2014 1st [facility] office visit/ Ramez Arrona   Chief Complaint  Patient presents with  . Advice Only    refer Dr. Nyoka Cowden. Denies any wheezing, no chest tx, no cough. Pt here to be evaluated for COPD.    variable sob with exertion - says can walk fine but no regular walking,  Some sob steps   rec Plan A = Backup=  symbiocrt 80 Take 2 puffs first thing in am and then another 2 puffs about 12 hours later.  Plan B = Back up Only use your combivent  as a rescue medication  Plan C = crisis Only use your nebulizer if you try B first and doesn't work You are cleared for surgery  > tol lap chole fine    09/30/2017  f/u ov/Aswad Wandrey re: asthma/ smoker/ f/u [facility] pna Chief Complaint  Patient presents with  . Follow-up    Pt c/o increased cough with white sputum, SOB, nasal congestion, runny nose x 2 wks.   Dyspnea:  Walked at [employer] using rollator 2 d prior to OV   Cough: white esp hs  Sleeping: not needing saba no elevate  SABA use: more need over the last 2 weeks 2-3 days/ neb x 2  02: just at hd   rec Plan A = Automatic = symbicort increase to 160 Take 2 puffs first thing in am and then another 2 puffs about 12 hours later.  Work on [employer] B = Backup Only use your albuterol (ventolin)  as a rescue medication Plan C = Crisis - only use your albuterol nebulizer if you first try Plan B and it fails to help > ok to use the nebulizer up to every 4 hours but if start needing it regularly call for immediate appointment The key is to stop smoking completely before smoking completely stops you!      01/08/2019  f/u ov/Alexandria Shiflett re: AB / symb 160 2bid    Chief Complaint  Patient presents with  . Follow-up    Breathing is doing well today and no new co's.    Dyspnea:  Walking at mall x 34min-20 min avg pace x up to sev times a week Cough: none  Sleeping: able to lie in flat bed  SABA use: rarely use saba / no neb  02: no  No change breathing Sunday pm vs any other day - HD  MWF rec Plan A = Automatic = Always=    Symbicort 160 Take 2 puffs first thing in am and then another 2 puffs about 12 hours later.  Work on inhaler technique:  think  of a golfer warming up before you use the inhaler  Plan B = Backup (to supplement plan A, not to replace it) Only use your albuterol (ventolin and proventil) inhaler as a rescue medication Plan C = Crisis (instead of Plan B but only if Plan B stops working) - only use your albuterol nebulizer if you first try Plan B   08/06/2019  f/u ov/Emelee Rodocker re: AB maint in symb 160 2bid  Chief Complaint  Patient presents with  . Follow-up    no pulm complaints, right ear periforation  Dyspnea:  Walking up to 10 min neighborhood s stopping - some hills Cough: none  Sleeping: on cpap /[place]meier  SABA use: none 02: none    No obvious day to day or daytime variability or assoc excess/ purulent sputum or mucus plugs or hemoptysis or cp or chest tightness, subjective wheeze or overt sinus or hb symptoms.   Sleeping as above without nocturnal  or early am exacerbation  of respiratory  c/o's or need for noct saba. Also denies any obvious fluctuation of symptoms with weather or environmental changes or other aggravating or alleviating factors except as outlined above   No unusual exposure hx or h/o childhood pna/ asthma or knowledge of premature birth.  Current Allergies, Complete Past Medical History, Past Surgical History, Family History, and Social History were reviewed in [employer] record.  ROS  The following are not active complaints unless bolded Hoarseness, sore throat, dysphagia,  dental problems, itching, sneezing,  nasal congestion or discharge of excess mucus or purulent secretions, R ear ache,   fever, chills, sweats, unintended wt loss or wt gain, classically pleuritic or exertional cp,  orthopnea pnd or arm/hand swelling  or leg swelling, presyncope, palpitations, abdominal pain, anorexia, nausea, vomiting, diarrhea  or change in bowel habits or change in bladder habits, change in stools or change in urine, dysuria, hematuria,  rash, arthralgias, visual complaints, headache, numbness, weakness or ataxia or problems with walking or coordination,  change in mood or  memory.        Current Meds  Medication Sig  . acetaminophen (TYLENOL) 500 MG tablet Take 1 tablet (500 mg total) by mouth every 6 (six) hours as needed for moderate pain.  Marland Kitchen albuterol (PROVENTIL HFA;VENTOLIN HFA) 108 (90 Base) MCG/ACT inhaler Inhale 2 puffs into the lungs every 6 (six) hours as needed for wheezing or shortness of breath.  Marland Kitchen amLODipine (NORVASC) 10 MG tablet Take 1 tablet (10 mg total) by mouth daily.  Marland Kitchen aspirin EC 81 MG tablet Take 81 mg by mouth daily.  Marland Kitchen b complex-vitamin c-folic acid (NEPHRO-VITE) 0.8 MG TABS tablet TAKE 1 TABLET BY MOUTH EVERY DAY  . budesonide-formoterol (SYMBICORT) 160-4.5 MCG/ACT inhaler Inhale 2 puffs into the lungs 2 (two) times daily.  . calcitRIOL (ROCALTROL) 0.5 MCG capsule Take 2 capsules (1 mcg total) by mouth every Monday, Wednesday, and Friday with hemodialysis.  . Cholecalciferol (VITAMIN D3) 5000 UNITS TABS Take 5,000 Units by mouth daily.   . cinacalcet (SENSIPAR) 90 MG tablet Take 90 mg by mouth every evening.  . clonazePAM (KLONOPIN) 0.5 MG tablet Take 1 tablet (0.5 mg total) by mouth 2 (two) times daily as needed for anxiety.  . diphenhydrAMINE (BENADRYL) 25 MG tablet Take 50 mg by mouth at bedtime.   . famotidine (PEPCID) 20 MG tablet Take 1 tablet (20 mg total) by mouth at bedtime.  Marland Kitchen guaiFENesin (MUCINEX) 600 MG 12 hr tablet Take 1,200 mg by mouth 2  (two) times daily.  . hydrALAZINE (APRESOLINE) 100 MG tablet Take 1 tablet (100 mg total) by mouth in the morning and at bedtime.  Marland Kitchen ipratropium-albuterol (DUONEB) 0.5-2.5 (3) MG/3ML SOLN Take 3 mLs by nebulization every 4 (four) hours as needed (Shortness of breath).  . lidocaine-prilocaine (EMLA) cream Apply 1 application topically See admin instructions. Apply 1 to 2 hours prior to dialysis on Mondays, Wednesdays, and Fridays. Cover with saran wrap.  . metoprolol tartrate (  LOPRESSOR) 100 MG tablet Take 1 tablet (100 mg total) by mouth 2 (two) times daily.  Marland Kitchen omeprazole (PRILOSEC) 20 MG capsule TAKE 1 CAPSULE TWICE DAILY BEFORE A MEAL  . ondansetron (ZOFRAN ODT) 4 MG disintegrating tablet Take 1 tablet (4 mg total) by mouth every 8 (eight) hours as needed for nausea or vomiting.  . pravastatin (PRAVACHOL) 40 MG tablet TAKE 1 TABLET BY MOUTH EVERY DAY IN THE EVENING  . PRESCRIPTION MEDICATION See admin instructions. CPAP- At bedtime and during any time of rest  . RENVELA 800 MG tablet Take 800-2,400 mg by mouth See admin instructions. Take 2,400 mg by mouth three times a day with meals and 800 mg with each snack  . sodium zirconium cyclosilicate (LOKELMA) 5 g packet Take 5 g by mouth daily.  . traMADol (ULTRAM) 50 MG tablet Take 1 tablet (50 mg total) by mouth daily as needed.                  Current Meds  Medication Sig  . acetaminophen (TYLENOL) 500 MG tablet Take 1 tablet (500 mg total) by mouth every 6 (six) hours as needed for moderate pain.  Marland Kitchen albuterol (PROVENTIL HFA;VENTOLIN HFA) 108 (90 Base) MCG/ACT inhaler Inhale 2 puffs into the lungs every 6 (six) hours as needed for wheezing or shortness of breath.  Marland Kitchen amLODipine (NORVASC) 10 MG tablet Take 1 tablet (10 mg total) by mouth daily. (Patient taking differently: Take 10 mg by mouth at bedtime. )  . aspirin EC 81 MG tablet Take 81 mg by mouth daily.  Marland Kitchen b complex-vitamin c-folic acid (NEPHRO-VITE) 0.8 MG TABS tablet TAKE 1 TABLET BY  MOUTH EVERY DAY (Patient taking differently: Take 1 tablet by mouth daily. )  . budesonide-formoterol (SYMBICORT) 160-4.5 MCG/ACT inhaler Inhale 2 puffs into the lungs 2 (two) times daily.  . calcitRIOL (ROCALTROL) 0.5 MCG capsule Take 2 capsules (1 mcg total) by mouth every Monday, Wednesday, and Friday with hemodialysis.  . Cholecalciferol (VITAMIN D3) 5000 UNITS TABS Take 5,000 Units by mouth daily.   . cinacalcet (SENSIPAR) 30 MG tablet Take 90 mg by mouth daily with supper.   . clonazePAM (KLONOPIN) 0.5 MG tablet Take 1 tablet (0.5 mg total) by mouth 2 (two) times daily as needed for anxiety.  . diphenhydrAMINE (BENADRYL) 25 MG tablet Take 50 mg by mouth at bedtime.   Marland Kitchen guaiFENesin (MUCINEX) 600 MG 12 hr tablet Take 1,200 mg by mouth 2 (two) times daily.  . hydrALAZINE (APRESOLINE) 100 MG tablet Take 1 tablet (100 mg total) by mouth 3 (three) times daily.  Marland Kitchen ipratropium-albuterol (DUONEB) 0.5-2.5 (3) MG/3ML SOLN Take 3 mLs by nebulization every 4 (four) hours as needed (Shortness of breath).  . isosorbide mononitrate (IMDUR) 30 MG 24 hr tablet Take 1 tablet (30 mg total) by mouth daily. (Patient taking differently: Take 30 mg by mouth at bedtime. )  . lidocaine-prilocaine (EMLA) cream Apply 1 application topically See admin instructions. Apply 1 to 2 hours prior to dialysis on Mondays, Wednesdays, and Fridays. Cover with saran wrap.  . metoprolol tartrate (LOPRESSOR) 100 MG tablet Take 1 tablet (100 mg total) by mouth 2 (two) times daily.  Marland Kitchen omeprazole (PRILOSEC) 20 MG capsule TAKE 1 CAPSULE TWICE DAILY BEFORE A MEAL (Patient taking differently: Take 20 mg by mouth 2 (two) times daily before a meal. )  . ondansetron (ZOFRAN ODT) 4 MG disintegrating tablet Take 1 tablet (4 mg total) by mouth every 8 (eight) hours as needed for  nausea or vomiting.  . pravastatin (PRAVACHOL) 40 MG tablet TAKE 1 TABLET EVERY EVENING (Patient taking differently: Take 40 mg by mouth at bedtime. )  . PRESCRIPTION  MEDICATION Inhale into the lungs at bedtime. CPAP  . sucroferric oxyhydroxide (VELPHORO) 500 MG chewable tablet Chew 500-1,500 mg by mouth See admin instructions. Chew 3 tablets (1500 mg) by mouth up to three times daily with meals and 1 tablet (500 mg) with snacks  . traMADol (ULTRAM) 50 MG tablet Take 1 tablet (50 mg total) by mouth daily as needed. (Patient taking differently: Take 50 mg by mouth at bedtime as needed for moderate pain. )          Objective:   Physical Exam   08/06/2019      206 01/08/2019     209  10/09/2018       213  07/08/2018       222 03/31/2018         229  12/30/2017       222  09/30/2017         222  07/11/2017       217   10/18/14 233 lb 12.8 oz (106.051 kg)  10/05/14 235 lb 3.2 oz (106.686 kg)  09/20/14 230 lb 6.1 oz (104.5 kg)    Stoic amb bmnad  Vital signs reviewed  08/06/2019  - Note at rest 02 sats  97% on RA     HEENT : pt wearing mask not removed for exam due to covid -19 concerns.    NECK :  without JVD/Nodes/TM/ nl carotid upstrokes bilaterally   LUNGS: no acc muscle use,  Nl contour chest which is clear to A and P bilaterally without cough on insp or exp maneuvers   CV:  RRR  no s3 or murmur or increase in P2, and no edema / shunt L forearm.  ABD:  soft and nontender with nl inspiratory excursion in the supine position. No bruits or organomegaly appreciated, bowel sounds nl  MS:  Nl gait/ ext warm without deformities, calf tenderness, cyanosis or clubbing No obvious joint restrictions   SKIN: warm and dry without lesions    NEURO:  alert, approp, nl sensorium with  no motor or cerebellar deficits apparent.                      Assessment & Plan:

## 2019-08-06 NOTE — Patient Instructions (Signed)
No change in medications  The key is to stop smoking completely before smoking completely stops you!   Please schedule a follow up visit in 12  months but call sooner if needed     

## 2019-08-06 NOTE — Assessment & Plan Note (Signed)
Active smoker - Spirometry 07/11/2017  Restrictive/ min curvature on no rx - 07/11/2017   Try symb 80 2bid - 09/30/2017     try symbicort 160 2bid as has rhonchi on exam, still smoking  - Spirometry 03/31/2018  FEV1 1.4 (41%)  Ratio 0.72 min curvature p symb 160 x 2 in am with poor baseline hfa - 01/08/2019  After extensive coaching inhaler device,  effectiveness =    90% / reviewed golfer analogy taking practice inhalations using empty device   - The proper method of use, as well as anticipated side effects, of a metered-dose inhaler are discussed and demonstrated to the patient reviewing again how golfers take practice swings (use empty inhaler) before they hit the real ball.   All goals of chronic asthma control met including optimal function and elimination of symptoms with minimal need for rescue therapy.  Contingencies discussed in full including contacting this office immediately if not controlling the symptoms using the rule of two's.   .           Each maintenance medication was reviewed in detail including emphasizing most importantly the difference between maintenance and prns and under what circumstances the prns are to be triggered using an action plan format where appropriate.  Total time for H and P, chart review, counseling, teaching device and generating customized AVS unique to this office visit / charting = 20 min

## 2019-08-07 DIAGNOSIS — N2581 Secondary hyperparathyroidism of renal origin: Secondary | ICD-10-CM | POA: Diagnosis not present

## 2019-08-07 DIAGNOSIS — D509 Iron deficiency anemia, unspecified: Secondary | ICD-10-CM | POA: Diagnosis not present

## 2019-08-07 DIAGNOSIS — N186 End stage renal disease: Secondary | ICD-10-CM | POA: Diagnosis not present

## 2019-08-07 DIAGNOSIS — D631 Anemia in chronic kidney disease: Secondary | ICD-10-CM | POA: Diagnosis not present

## 2019-08-07 DIAGNOSIS — Z992 Dependence on renal dialysis: Secondary | ICD-10-CM | POA: Diagnosis not present

## 2019-08-10 DIAGNOSIS — D631 Anemia in chronic kidney disease: Secondary | ICD-10-CM | POA: Diagnosis not present

## 2019-08-10 DIAGNOSIS — N2581 Secondary hyperparathyroidism of renal origin: Secondary | ICD-10-CM | POA: Diagnosis not present

## 2019-08-10 DIAGNOSIS — N186 End stage renal disease: Secondary | ICD-10-CM | POA: Diagnosis not present

## 2019-08-10 DIAGNOSIS — D509 Iron deficiency anemia, unspecified: Secondary | ICD-10-CM | POA: Diagnosis not present

## 2019-08-10 DIAGNOSIS — Z992 Dependence on renal dialysis: Secondary | ICD-10-CM | POA: Diagnosis not present

## 2019-08-12 DIAGNOSIS — D509 Iron deficiency anemia, unspecified: Secondary | ICD-10-CM | POA: Diagnosis not present

## 2019-08-12 DIAGNOSIS — N186 End stage renal disease: Secondary | ICD-10-CM | POA: Diagnosis not present

## 2019-08-12 DIAGNOSIS — N2581 Secondary hyperparathyroidism of renal origin: Secondary | ICD-10-CM | POA: Diagnosis not present

## 2019-08-12 DIAGNOSIS — Z992 Dependence on renal dialysis: Secondary | ICD-10-CM | POA: Diagnosis not present

## 2019-08-12 DIAGNOSIS — D631 Anemia in chronic kidney disease: Secondary | ICD-10-CM | POA: Diagnosis not present

## 2019-08-14 DIAGNOSIS — N2581 Secondary hyperparathyroidism of renal origin: Secondary | ICD-10-CM | POA: Diagnosis not present

## 2019-08-14 DIAGNOSIS — D631 Anemia in chronic kidney disease: Secondary | ICD-10-CM | POA: Diagnosis not present

## 2019-08-14 DIAGNOSIS — Z992 Dependence on renal dialysis: Secondary | ICD-10-CM | POA: Diagnosis not present

## 2019-08-14 DIAGNOSIS — N186 End stage renal disease: Secondary | ICD-10-CM | POA: Diagnosis not present

## 2019-08-14 DIAGNOSIS — D509 Iron deficiency anemia, unspecified: Secondary | ICD-10-CM | POA: Diagnosis not present

## 2019-08-17 DIAGNOSIS — Z992 Dependence on renal dialysis: Secondary | ICD-10-CM | POA: Diagnosis not present

## 2019-08-17 DIAGNOSIS — D509 Iron deficiency anemia, unspecified: Secondary | ICD-10-CM | POA: Diagnosis not present

## 2019-08-17 DIAGNOSIS — D631 Anemia in chronic kidney disease: Secondary | ICD-10-CM | POA: Diagnosis not present

## 2019-08-17 DIAGNOSIS — N2581 Secondary hyperparathyroidism of renal origin: Secondary | ICD-10-CM | POA: Diagnosis not present

## 2019-08-17 DIAGNOSIS — N186 End stage renal disease: Secondary | ICD-10-CM | POA: Diagnosis not present

## 2019-08-19 DIAGNOSIS — D631 Anemia in chronic kidney disease: Secondary | ICD-10-CM | POA: Diagnosis not present

## 2019-08-19 DIAGNOSIS — N2581 Secondary hyperparathyroidism of renal origin: Secondary | ICD-10-CM | POA: Diagnosis not present

## 2019-08-19 DIAGNOSIS — N186 End stage renal disease: Secondary | ICD-10-CM | POA: Diagnosis not present

## 2019-08-19 DIAGNOSIS — Z992 Dependence on renal dialysis: Secondary | ICD-10-CM | POA: Diagnosis not present

## 2019-08-19 DIAGNOSIS — D509 Iron deficiency anemia, unspecified: Secondary | ICD-10-CM | POA: Diagnosis not present

## 2019-08-20 ENCOUNTER — Ambulatory Visit: Payer: Medicare Other | Admitting: Physician Assistant

## 2019-08-21 DIAGNOSIS — Z992 Dependence on renal dialysis: Secondary | ICD-10-CM | POA: Diagnosis not present

## 2019-08-21 DIAGNOSIS — N2581 Secondary hyperparathyroidism of renal origin: Secondary | ICD-10-CM | POA: Diagnosis not present

## 2019-08-21 DIAGNOSIS — D509 Iron deficiency anemia, unspecified: Secondary | ICD-10-CM | POA: Diagnosis not present

## 2019-08-21 DIAGNOSIS — D631 Anemia in chronic kidney disease: Secondary | ICD-10-CM | POA: Diagnosis not present

## 2019-08-21 DIAGNOSIS — N186 End stage renal disease: Secondary | ICD-10-CM | POA: Diagnosis not present

## 2019-08-23 DIAGNOSIS — Z992 Dependence on renal dialysis: Secondary | ICD-10-CM | POA: Diagnosis not present

## 2019-08-23 DIAGNOSIS — T8612 Kidney transplant failure: Secondary | ICD-10-CM | POA: Diagnosis not present

## 2019-08-23 DIAGNOSIS — N186 End stage renal disease: Secondary | ICD-10-CM | POA: Diagnosis not present

## 2019-08-24 DIAGNOSIS — D509 Iron deficiency anemia, unspecified: Secondary | ICD-10-CM | POA: Diagnosis not present

## 2019-08-24 DIAGNOSIS — Z992 Dependence on renal dialysis: Secondary | ICD-10-CM | POA: Diagnosis not present

## 2019-08-24 DIAGNOSIS — N186 End stage renal disease: Secondary | ICD-10-CM | POA: Diagnosis not present

## 2019-08-24 DIAGNOSIS — N2581 Secondary hyperparathyroidism of renal origin: Secondary | ICD-10-CM | POA: Diagnosis not present

## 2019-08-24 DIAGNOSIS — D631 Anemia in chronic kidney disease: Secondary | ICD-10-CM | POA: Diagnosis not present

## 2019-08-26 DIAGNOSIS — N186 End stage renal disease: Secondary | ICD-10-CM | POA: Diagnosis not present

## 2019-08-26 DIAGNOSIS — N2581 Secondary hyperparathyroidism of renal origin: Secondary | ICD-10-CM | POA: Diagnosis not present

## 2019-08-26 DIAGNOSIS — D509 Iron deficiency anemia, unspecified: Secondary | ICD-10-CM | POA: Diagnosis not present

## 2019-08-26 DIAGNOSIS — Z992 Dependence on renal dialysis: Secondary | ICD-10-CM | POA: Diagnosis not present

## 2019-08-26 DIAGNOSIS — D631 Anemia in chronic kidney disease: Secondary | ICD-10-CM | POA: Diagnosis not present

## 2019-08-28 DIAGNOSIS — N186 End stage renal disease: Secondary | ICD-10-CM | POA: Diagnosis not present

## 2019-08-28 DIAGNOSIS — N2581 Secondary hyperparathyroidism of renal origin: Secondary | ICD-10-CM | POA: Diagnosis not present

## 2019-08-28 DIAGNOSIS — D509 Iron deficiency anemia, unspecified: Secondary | ICD-10-CM | POA: Diagnosis not present

## 2019-08-28 DIAGNOSIS — Z992 Dependence on renal dialysis: Secondary | ICD-10-CM | POA: Diagnosis not present

## 2019-08-28 DIAGNOSIS — D631 Anemia in chronic kidney disease: Secondary | ICD-10-CM | POA: Diagnosis not present

## 2019-08-31 DIAGNOSIS — D631 Anemia in chronic kidney disease: Secondary | ICD-10-CM | POA: Diagnosis not present

## 2019-08-31 DIAGNOSIS — Z992 Dependence on renal dialysis: Secondary | ICD-10-CM | POA: Diagnosis not present

## 2019-08-31 DIAGNOSIS — N186 End stage renal disease: Secondary | ICD-10-CM | POA: Diagnosis not present

## 2019-08-31 DIAGNOSIS — D509 Iron deficiency anemia, unspecified: Secondary | ICD-10-CM | POA: Diagnosis not present

## 2019-08-31 DIAGNOSIS — N2581 Secondary hyperparathyroidism of renal origin: Secondary | ICD-10-CM | POA: Diagnosis not present

## 2019-09-02 DIAGNOSIS — Z992 Dependence on renal dialysis: Secondary | ICD-10-CM | POA: Diagnosis not present

## 2019-09-02 DIAGNOSIS — N2581 Secondary hyperparathyroidism of renal origin: Secondary | ICD-10-CM | POA: Diagnosis not present

## 2019-09-02 DIAGNOSIS — D631 Anemia in chronic kidney disease: Secondary | ICD-10-CM | POA: Diagnosis not present

## 2019-09-02 DIAGNOSIS — D509 Iron deficiency anemia, unspecified: Secondary | ICD-10-CM | POA: Diagnosis not present

## 2019-09-02 DIAGNOSIS — N186 End stage renal disease: Secondary | ICD-10-CM | POA: Diagnosis not present

## 2019-09-03 DIAGNOSIS — K089 Disorder of teeth and supporting structures, unspecified: Secondary | ICD-10-CM | POA: Diagnosis not present

## 2019-09-03 DIAGNOSIS — Z7682 Awaiting organ transplant status: Secondary | ICD-10-CM | POA: Diagnosis not present

## 2019-09-03 DIAGNOSIS — Z114 Encounter for screening for human immunodeficiency virus [HIV]: Secondary | ICD-10-CM | POA: Diagnosis not present

## 2019-09-03 DIAGNOSIS — Z125 Encounter for screening for malignant neoplasm of prostate: Secondary | ICD-10-CM | POA: Diagnosis not present

## 2019-09-03 DIAGNOSIS — D649 Anemia, unspecified: Secondary | ICD-10-CM | POA: Diagnosis not present

## 2019-09-03 DIAGNOSIS — Z01810 Encounter for preprocedural cardiovascular examination: Secondary | ICD-10-CM | POA: Diagnosis not present

## 2019-09-03 DIAGNOSIS — Z01818 Encounter for other preprocedural examination: Secondary | ICD-10-CM | POA: Diagnosis not present

## 2019-09-03 DIAGNOSIS — Z1159 Encounter for screening for other viral diseases: Secondary | ICD-10-CM | POA: Diagnosis not present

## 2019-09-04 ENCOUNTER — Telehealth: Payer: Self-pay | Admitting: Internal Medicine

## 2019-09-04 DIAGNOSIS — N2581 Secondary hyperparathyroidism of renal origin: Secondary | ICD-10-CM | POA: Diagnosis not present

## 2019-09-04 DIAGNOSIS — D509 Iron deficiency anemia, unspecified: Secondary | ICD-10-CM | POA: Diagnosis not present

## 2019-09-04 DIAGNOSIS — Z992 Dependence on renal dialysis: Secondary | ICD-10-CM | POA: Diagnosis not present

## 2019-09-04 DIAGNOSIS — D631 Anemia in chronic kidney disease: Secondary | ICD-10-CM | POA: Diagnosis not present

## 2019-09-04 DIAGNOSIS — N186 End stage renal disease: Secondary | ICD-10-CM | POA: Diagnosis not present

## 2019-09-04 NOTE — Telephone Encounter (Signed)
New message     Patient is trying to get on the [facility] transplant list.  Calling to see if he is due for a stress test any time soon.

## 2019-09-04 NOTE — Telephone Encounter (Signed)
Sent to MD to advise if stress test is needed. He had one a year ago

## 2019-09-04 NOTE — Telephone Encounter (Signed)
Patient's wife aware no stress test needed at this time, unless required by [facility] transplant team. If they need records, release form would need to be signed. Explained to wife they should be able to access in our charting system

## 2019-09-04 NOTE — Telephone Encounter (Signed)
Doesn't need a stress test unless transplant team requires it.  Dr Lemmie Evens

## 2019-09-07 DIAGNOSIS — D509 Iron deficiency anemia, unspecified: Secondary | ICD-10-CM | POA: Diagnosis not present

## 2019-09-07 DIAGNOSIS — N2581 Secondary hyperparathyroidism of renal origin: Secondary | ICD-10-CM | POA: Diagnosis not present

## 2019-09-07 DIAGNOSIS — D631 Anemia in chronic kidney disease: Secondary | ICD-10-CM | POA: Diagnosis not present

## 2019-09-07 DIAGNOSIS — N186 End stage renal disease: Secondary | ICD-10-CM | POA: Diagnosis not present

## 2019-09-07 DIAGNOSIS — Z992 Dependence on renal dialysis: Secondary | ICD-10-CM | POA: Diagnosis not present

## 2019-09-09 ENCOUNTER — Telehealth: Payer: Self-pay | Admitting: Internal Medicine

## 2019-09-09 DIAGNOSIS — D631 Anemia in chronic kidney disease: Secondary | ICD-10-CM | POA: Diagnosis not present

## 2019-09-09 DIAGNOSIS — Z992 Dependence on renal dialysis: Secondary | ICD-10-CM | POA: Diagnosis not present

## 2019-09-09 DIAGNOSIS — N186 End stage renal disease: Secondary | ICD-10-CM | POA: Diagnosis not present

## 2019-09-09 DIAGNOSIS — D509 Iron deficiency anemia, unspecified: Secondary | ICD-10-CM | POA: Diagnosis not present

## 2019-09-09 DIAGNOSIS — N2581 Secondary hyperparathyroidism of renal origin: Secondary | ICD-10-CM | POA: Diagnosis not present

## 2019-09-09 NOTE — Telephone Encounter (Signed)
Agree thanks!  Dr. Lemmie Evens

## 2019-09-09 NOTE — Telephone Encounter (Signed)
Okay to use clonidine as prescribed

## 2019-09-09 NOTE — Telephone Encounter (Signed)
   Pt c/o medication issue:  1. Name of Medication: clonidine 0.1 mg   2. How are you currently taking this medication (dosage and times per day)? Take 1 pill at night  3. Are you having a reaction (difficulty breathing--STAT)?  4. What is your medication issue? Pt's wife called back, she said this is the prescription of pt's nephrologist gave. She said if Dr. Debara Pickett is fine with no need to call her back.

## 2019-09-09 NOTE — Telephone Encounter (Signed)
Pt c/o BP issue: STAT if pt c/o blurred vision, one-sided weakness or slurred speech  1. What are your last 5 BP readings?   190/80 188/70 160/70 200/80  2. Are you having any other symptoms (ex. Dizziness, headache, blurred vision, passed out)? Headaches  3. What is your BP issue? Patient's wife states patient's BP has been elevated.

## 2019-09-09 NOTE — Telephone Encounter (Signed)
Spoke with patient's wife who reports elevated BP off and on for 2.5-3 weeks 190/80 188/70 160/70 200/80  Patient also has end-stage renal disease. Nephrologist was wanting to prescribe a new BP med but wife wanted Dr. Debara Pickett to advise on BP issues. Patient saw nephrologist today (188/70 at this office). Asked that she call back with name, dose, frequency of med prescribed by nephrologist - will have MD review. Explained that we generally don't make too make med changes at one time, so if Dr. Debara Pickett feels the new med is appropriate, patient should try it and see if tolerated and if effective. She will call back.

## 2019-09-09 NOTE — Telephone Encounter (Signed)
Notified wife OK to use clonidine. Added to med list. Advised to monitor home BP

## 2019-09-09 NOTE — Telephone Encounter (Signed)
Please advise if clonidine 0.1mg  is appropriate med for BP? Prescribed by nephrologist

## 2019-09-10 ENCOUNTER — Encounter (INDEPENDENT_AMBULATORY_CARE_PROVIDER_SITE_OTHER): Payer: Self-pay | Admitting: Otolaryngology

## 2019-09-10 ENCOUNTER — Ambulatory Visit (INDEPENDENT_AMBULATORY_CARE_PROVIDER_SITE_OTHER): Payer: Medicare Other | Admitting: Otolaryngology

## 2019-09-10 ENCOUNTER — Other Ambulatory Visit: Payer: Self-pay

## 2019-09-10 VITALS — Temp 97.7°F

## 2019-09-10 DIAGNOSIS — H9313 Tinnitus, bilateral: Secondary | ICD-10-CM

## 2019-09-10 DIAGNOSIS — H7291 Unspecified perforation of tympanic membrane, right ear: Secondary | ICD-10-CM

## 2019-09-10 DIAGNOSIS — H903 Sensorineural hearing loss, bilateral: Secondary | ICD-10-CM | POA: Diagnosis not present

## 2019-09-10 DIAGNOSIS — H90A12 Conductive hearing loss, unilateral, left ear with restricted hearing on the contralateral side: Secondary | ICD-10-CM

## 2019-09-10 DIAGNOSIS — H906 Mixed conductive and sensorineural hearing loss, bilateral: Secondary | ICD-10-CM | POA: Diagnosis not present

## 2019-09-10 NOTE — Progress Notes (Signed)
HPI: Kirk [name] is a 59 y.o. male who presents is referred by his PCP for evaluation of worsening tinnitus and hearing loss.  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  He has history of [place]'s disease that causes hearing problems and kidney problems.  He is on dialysis. He has occasional drainage from the right ear.  He was noted to have a hole in the right TM.Marland Kitchen  Past Medical History:  Diagnosis Date  . Acute edema of lung, unspecified   . Acute, but ill-defined, cerebrovascular disease   . Allergy   . Anemia   . 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)   . Anxiety   . Asthma   . Asthma    moderate persistent  . Carpal tunnel syndrome   . Cellulitis and abscess of trunk   . Cholelithiasis 07/13/2014  . Chronic headaches   . Debility, unspecified   . Dermatophytosis of the body   . Dysrhythmia    history of  . Edema   . End stage renal disease on dialysis [facility])    "MWF; [facility]" (10/21/2014)  . Essential hypertension, benign   . GERD (gastroesophageal reflux disease)   . Gout, unspecified   . HTN (hypertension)   . Hypertrophy of prostate without urinary obstruction and other lower urinary tract symptoms (LUTS)   . Hypotension, unspecified   . Impotence of organic origin   . Insomnia, unspecified   . Kidney replaced by transplant   . Localization-related (focal) (partial) epilepsy and epileptic syndromes with complex partial seizures, without mention of intractable epilepsy   . Lumbago   . Memory loss   . OSA on CPAP   . Other and unspecified hyperlipidemia   . Other chronic nonalcoholic liver disease   . Other malaise and fatigue   . Other nonspecific abnormal serum enzyme levels   . Pain in joint, lower leg   . Pain in joint, upper arm   . Pneumonia "several times"  . Renal dialysis status(V45.11) 02/05/2010   restarted 01/02/13 ofter renal trransplant failure   . Secondary hyperparathyroidism (of renal origin)   . Shortness of breath   . Sleep apnea   . Tension headache   . Unspecified constipation   . Unspecified essential hypertension   . Unspecified hereditary and idiopathic peripheral neuropathy   . Unspecified vitamin D deficiency    Past Surgical History:  Procedure Laterality Date  . AV FISTULA PLACEMENT Left ?2010   "forearm; at [facility]"  . BACK SURGERY    . CARDIAC CATHETERIZATION  03/21/2011  . CHOLECYSTECTOMY N/A 10/21/2014   Procedure: LAPAROSCOPIC CHOLECYSTECTOMY WITH INTRAOPERATIVE CHOLANGIOGRAM;  Surgeon: Autumn Messing III, MD;  Location: [place];  Service: General;  Laterality: N/A;  . COLONOSCOPY    . 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 KIDNEY TRANSPLANT  08/17/2011   [place]  10/21/2014   w/IOC  . LEFT HEART CATHETERIZATION WITH CORONARY ANGIOGRAM N/A 03/21/2011   Procedure: LEFT HEART CATHETERIZATION WITH CORONARY ANGIOGRAM;  Surgeon: [name], MD;  Location: [facility];  Service: Cardiovascular;  Laterality: N/A;  . NEPHRECTOMY  08/2013   removed transplaned kidney  . POSTERIOR FUSION CERVICAL SPINE  06/25/2012   for spinal stenosis  . VASECTOMY  2010   Social History   Socioeconomic History  . Marital status: Married  Spouse name: [name]  . Number of children: 3  . Years of education: Not on file  . Highest education level: Not on file  Occupational History  . Occupation: disabled    Employer: DISABLED  Tobacco Use  . Smoking status: Current Every Day Smoker    Packs/day: 0.50    Years: 32.00    Pack years: 16.00    Types: Cigarettes  . Smokeless tobacco: Never Used  . Tobacco comment: 3 cigs per day 08/06/2019  Vaping Use  . Vaping Use: Never used  Substance and Sexual Activity  . Alcohol use: No    Alcohol/week: 0.0 standard drinks  . Drug use: No  . Sexual activity: Yes  Other Topics Concern  . Not on file  Social History  Narrative   Drinks 1 cup of caffeine daily.   Social Determinants of Health   Financial Resource Strain:   . Difficulty of Paying Living Expenses: Not on file  Food Insecurity:   . Worried About Charity fundraiser in the Last Year: Not on file  . Ran Out of Food in the Last Year: Not on file  Transportation Needs:   . Lack of Transportation (Medical): Not on file  . Lack of Transportation (Non-Medical): Not on file  Physical Activity:   . Days of Exercise per Week: Not on file  . Minutes of Exercise per Session: Not on file  Stress:   . Feeling of Stress : Not on file  Social Connections:   . Frequency of Communication with Friends and Family: Not on file  . Frequency of Social Gatherings with Friends and Family: Not on file  . Attends Religious Services: Not on file  . Active Member of Clubs or Organizations: Not on file  . Attends Archivist Meetings: Not on file  . Marital Status: Not on file   Family History  Adopted: Yes  Problem Relation Age of Onset  . Colon cancer Neg Hx   . Esophageal cancer Neg Hx   . Rectal cancer Neg Hx   . Stomach cancer Neg Hx    Allergies  Allergen Reactions  . Codeine Nausea And Vomiting   Prior to Admission medications   Medication Sig Start Date End Date Taking? Authorizing Provider  acetaminophen (TYLENOL) 500 MG tablet Take 1 tablet (500 mg total) by mouth every 6 (six) hours as needed for moderate pain. 09/12/14  Yes Samuella Cota, MD  albuterol (PROVENTIL HFA;VENTOLIN HFA) 108 (90 Base) MCG/ACT inhaler Inhale 2 puffs into the lungs every 6 (six) hours as needed for wheezing or shortness of breath. 06/05/17  Yes Patrecia Pour, MD  amLODipine (NORVASC) 10 MG tablet Take 1 tablet (10 mg total) by mouth daily. 01/20/19  Yes [name], MD  aspirin EC 81 MG tablet Take 81 mg by mouth daily.   Yes [provider]  b complex-vitamin c-folic acid (NEPHRO-VITE) 0.8 MG TABS tablet TAKE 1 TABLET BY MOUTH EVERY DAY  07/30/19  Yes Reed, Tiffany L, DO  budesonide-formoterol (SYMBICORT) 160-4.5 MCG/ACT inhaler Inhale 2 puffs into the lungs 2 (two) times daily. 10/09/18  Yes Tanda Rockers, MD  calcitRIOL (ROCALTROL) 0.5 MCG capsule Take 2 capsules (1 mcg total) by mouth every Monday, Wednesday, and Friday with hemodialysis. 06/04/18  Yes Black, Lezlie Octave, NP  Cholecalciferol (VITAMIN D3) 5000 UNITS TABS Take 5,000 Units by mouth daily.    Yes [provider]  cinacalcet (SENSIPAR) 90 MG tablet Take 90 mg by  mouth every evening.   Yes [provider]  clonazePAM (KLONOPIN) 0.5 MG tablet Take 1 tablet (0.5 mg total) by mouth 2 (two) times daily as needed for anxiety. 08/04/19  Yes Ngetich, Dinah C, NP  cloNIDine (CATAPRES) 0.1 MG tablet Take 0.1 mg by mouth at bedtime.   Yes [provider]  diphenhydrAMINE (BENADRYL) 25 MG tablet Take 50 mg by mouth at bedtime.    Yes [provider]  famotidine (PEPCID) 20 MG tablet Take 1 tablet (20 mg total) by mouth at bedtime. 06/23/19  Yes Milus Banister, MD  guaiFENesin (MUCINEX) 600 MG 12 hr tablet Take 1,200 mg by mouth 2 (two) times daily.   Yes [provider]  hydrALAZINE (APRESOLINE) 100 MG tablet Take 1 tablet (100 mg total) by mouth in the morning and at bedtime. 08/03/19  Yes Hilty, Nadean Corwin, MD  ipratropium-albuterol (DUONEB) 0.5-2.5 (3) MG/3ML SOLN Take 3 mLs by nebulization every 4 (four) hours as needed (Shortness of breath). 12/05/18  Yes Mariel Aloe, MD  lidocaine-prilocaine (EMLA) cream Apply 1 application topically See admin instructions. Apply 1 to 2 hours prior to dialysis on Mondays, Wednesdays, and Fridays. Cover with saran wrap. 11/18/18  Yes [provider]  metoprolol tartrate (LOPRESSOR) 100 MG tablet Take 1 tablet (100 mg total) by mouth 2 (two) times daily. 07/02/19 07/01/20 Yes Hilty, Nadean Corwin, MD  omeprazole (PRILOSEC) 20 MG capsule TAKE 1 CAPSULE TWICE DAILY BEFORE A MEAL 03/18/18  Yes Milus Banister, MD  ondansetron (ZOFRAN ODT) 4 MG disintegrating tablet Take 1 tablet (4 mg total) by mouth every 8 (eight) hours as needed for nausea or vomiting. 06/29/19  Yes Reed, Tiffany L, DO  pravastatin (PRAVACHOL) 40 MG tablet TAKE 1 TABLET BY MOUTH EVERY DAY IN THE EVENING 04/30/19  Yes Reed, Tiffany L, DO  PRESCRIPTION MEDICATION See admin instructions. CPAP- At bedtime and during any time of rest   Yes [provider]  RENVELA 800 MG tablet Take 800-2,400 mg by mouth See admin instructions. Take 2,400 mg by mouth three times a day with meals and 800 mg with each snack 07/06/19  Yes [provider]  sodium zirconium cyclosilicate (LOKELMA) 5 g packet Take 5 g by mouth daily. 07/15/19  Yes Charlesetta Shanks, MD  traMADol (ULTRAM) 50 MG tablet Take 1 tablet (50 mg total) by mouth daily as needed. 08/04/19  Yes Ngetich, Dinah C, NP  isosorbide mononitrate (IMDUR) 30 MG 24 hr tablet Take 1 tablet (30 mg total) by mouth daily. 10/21/18 08/04/19  Hilty, Nadean Corwin, MD     Positive ROS: Otherwise negative  All other systems have been reviewed and were otherwise negative with the exception of those mentioned in the HPI and as above.  Physical Exam: Constitutional: Alert, well-appearing, no acute distress Ears: External ears without lesions or tenderness.  Left ear canal and left TM are intact.  Right ear reveals what appears to be a lateralized right TM with a central perforation.  No active drainage noted presently.  This appears to be chronic probably related to his previous surgery when he was young Nasal: External nose without lesions.. Clear nasal passages Oral: Lips and gums without lesions. Tongue and palate mucosa without lesions. Posterior oropharynx clear. Neck: No palpable adenopathy or masses Respiratory: Breathing comfortably  Skin: No facial/neck lesions or rash noted.  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  SRT's were 75 dB on the right and 45 dB on the left.  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  Plan: Discussed with the patient as well as his wife concerning need for hearing aids which would help with his hearing overall as well as help with the tinnitus. There is not great treatment for the tinnitus but discussed with him concerning using masking noise. Also gave him some samples of Lipo flavonoid to try as this is beneficial in some people.   Radene Journey, MD   CC:

## 2019-09-11 DIAGNOSIS — N186 End stage renal disease: Secondary | ICD-10-CM | POA: Diagnosis not present

## 2019-09-11 DIAGNOSIS — Z992 Dependence on renal dialysis: Secondary | ICD-10-CM | POA: Diagnosis not present

## 2019-09-11 DIAGNOSIS — N2581 Secondary hyperparathyroidism of renal origin: Secondary | ICD-10-CM | POA: Diagnosis not present

## 2019-09-11 DIAGNOSIS — D631 Anemia in chronic kidney disease: Secondary | ICD-10-CM | POA: Diagnosis not present

## 2019-09-11 DIAGNOSIS — D509 Iron deficiency anemia, unspecified: Secondary | ICD-10-CM | POA: Diagnosis not present

## 2019-09-14 ENCOUNTER — Encounter (INDEPENDENT_AMBULATORY_CARE_PROVIDER_SITE_OTHER): Payer: Self-pay

## 2019-09-14 DIAGNOSIS — D509 Iron deficiency anemia, unspecified: Secondary | ICD-10-CM | POA: Diagnosis not present

## 2019-09-14 DIAGNOSIS — Z992 Dependence on renal dialysis: Secondary | ICD-10-CM | POA: Diagnosis not present

## 2019-09-14 DIAGNOSIS — N2581 Secondary hyperparathyroidism of renal origin: Secondary | ICD-10-CM | POA: Diagnosis not present

## 2019-09-14 DIAGNOSIS — D631 Anemia in chronic kidney disease: Secondary | ICD-10-CM | POA: Diagnosis not present

## 2019-09-14 DIAGNOSIS — N186 End stage renal disease: Secondary | ICD-10-CM | POA: Diagnosis not present

## 2019-09-16 DIAGNOSIS — N186 End stage renal disease: Secondary | ICD-10-CM | POA: Diagnosis not present

## 2019-09-16 DIAGNOSIS — D509 Iron deficiency anemia, unspecified: Secondary | ICD-10-CM | POA: Diagnosis not present

## 2019-09-16 DIAGNOSIS — N2581 Secondary hyperparathyroidism of renal origin: Secondary | ICD-10-CM | POA: Diagnosis not present

## 2019-09-16 DIAGNOSIS — Z992 Dependence on renal dialysis: Secondary | ICD-10-CM | POA: Diagnosis not present

## 2019-09-16 DIAGNOSIS — D631 Anemia in chronic kidney disease: Secondary | ICD-10-CM | POA: Diagnosis not present

## 2019-09-18 DIAGNOSIS — N186 End stage renal disease: Secondary | ICD-10-CM | POA: Diagnosis not present

## 2019-09-18 DIAGNOSIS — D509 Iron deficiency anemia, unspecified: Secondary | ICD-10-CM | POA: Diagnosis not present

## 2019-09-18 DIAGNOSIS — N2581 Secondary hyperparathyroidism of renal origin: Secondary | ICD-10-CM | POA: Diagnosis not present

## 2019-09-18 DIAGNOSIS — D631 Anemia in chronic kidney disease: Secondary | ICD-10-CM | POA: Diagnosis not present

## 2019-09-18 DIAGNOSIS — Z992 Dependence on renal dialysis: Secondary | ICD-10-CM | POA: Diagnosis not present

## 2019-09-21 DIAGNOSIS — D509 Iron deficiency anemia, unspecified: Secondary | ICD-10-CM | POA: Diagnosis not present

## 2019-09-21 DIAGNOSIS — N186 End stage renal disease: Secondary | ICD-10-CM | POA: Diagnosis not present

## 2019-09-21 DIAGNOSIS — D631 Anemia in chronic kidney disease: Secondary | ICD-10-CM | POA: Diagnosis not present

## 2019-09-21 DIAGNOSIS — N2581 Secondary hyperparathyroidism of renal origin: Secondary | ICD-10-CM | POA: Diagnosis not present

## 2019-09-21 DIAGNOSIS — Z992 Dependence on renal dialysis: Secondary | ICD-10-CM | POA: Diagnosis not present

## 2019-09-23 DIAGNOSIS — Z992 Dependence on renal dialysis: Secondary | ICD-10-CM | POA: Diagnosis not present

## 2019-09-23 DIAGNOSIS — N2581 Secondary hyperparathyroidism of renal origin: Secondary | ICD-10-CM | POA: Diagnosis not present

## 2019-09-23 DIAGNOSIS — D631 Anemia in chronic kidney disease: Secondary | ICD-10-CM | POA: Diagnosis not present

## 2019-09-23 DIAGNOSIS — N186 End stage renal disease: Secondary | ICD-10-CM | POA: Diagnosis not present

## 2019-09-23 DIAGNOSIS — D509 Iron deficiency anemia, unspecified: Secondary | ICD-10-CM | POA: Diagnosis not present

## 2019-09-23 DIAGNOSIS — T8612 Kidney transplant failure: Secondary | ICD-10-CM | POA: Diagnosis not present

## 2019-09-25 DIAGNOSIS — N186 End stage renal disease: Secondary | ICD-10-CM | POA: Diagnosis not present

## 2019-09-25 DIAGNOSIS — N2581 Secondary hyperparathyroidism of renal origin: Secondary | ICD-10-CM | POA: Diagnosis not present

## 2019-09-25 DIAGNOSIS — D631 Anemia in chronic kidney disease: Secondary | ICD-10-CM | POA: Diagnosis not present

## 2019-09-25 DIAGNOSIS — D509 Iron deficiency anemia, unspecified: Secondary | ICD-10-CM | POA: Diagnosis not present

## 2019-09-25 DIAGNOSIS — Z992 Dependence on renal dialysis: Secondary | ICD-10-CM | POA: Diagnosis not present

## 2019-09-28 DIAGNOSIS — D631 Anemia in chronic kidney disease: Secondary | ICD-10-CM | POA: Diagnosis not present

## 2019-09-28 DIAGNOSIS — N186 End stage renal disease: Secondary | ICD-10-CM | POA: Diagnosis not present

## 2019-09-28 DIAGNOSIS — N2581 Secondary hyperparathyroidism of renal origin: Secondary | ICD-10-CM | POA: Diagnosis not present

## 2019-09-28 DIAGNOSIS — Z992 Dependence on renal dialysis: Secondary | ICD-10-CM | POA: Diagnosis not present

## 2019-09-28 DIAGNOSIS — D509 Iron deficiency anemia, unspecified: Secondary | ICD-10-CM | POA: Diagnosis not present

## 2019-09-30 DIAGNOSIS — D631 Anemia in chronic kidney disease: Secondary | ICD-10-CM | POA: Diagnosis not present

## 2019-09-30 DIAGNOSIS — Z992 Dependence on renal dialysis: Secondary | ICD-10-CM | POA: Diagnosis not present

## 2019-09-30 DIAGNOSIS — N186 End stage renal disease: Secondary | ICD-10-CM | POA: Diagnosis not present

## 2019-09-30 DIAGNOSIS — D509 Iron deficiency anemia, unspecified: Secondary | ICD-10-CM | POA: Diagnosis not present

## 2019-09-30 DIAGNOSIS — N2581 Secondary hyperparathyroidism of renal origin: Secondary | ICD-10-CM | POA: Diagnosis not present

## 2019-10-02 DIAGNOSIS — D631 Anemia in chronic kidney disease: Secondary | ICD-10-CM | POA: Diagnosis not present

## 2019-10-02 DIAGNOSIS — N186 End stage renal disease: Secondary | ICD-10-CM | POA: Diagnosis not present

## 2019-10-02 DIAGNOSIS — N2581 Secondary hyperparathyroidism of renal origin: Secondary | ICD-10-CM | POA: Diagnosis not present

## 2019-10-02 DIAGNOSIS — D509 Iron deficiency anemia, unspecified: Secondary | ICD-10-CM | POA: Diagnosis not present

## 2019-10-02 DIAGNOSIS — Z992 Dependence on renal dialysis: Secondary | ICD-10-CM | POA: Diagnosis not present

## 2019-10-05 DIAGNOSIS — D631 Anemia in chronic kidney disease: Secondary | ICD-10-CM | POA: Diagnosis not present

## 2019-10-05 DIAGNOSIS — Z992 Dependence on renal dialysis: Secondary | ICD-10-CM | POA: Diagnosis not present

## 2019-10-05 DIAGNOSIS — D509 Iron deficiency anemia, unspecified: Secondary | ICD-10-CM | POA: Diagnosis not present

## 2019-10-05 DIAGNOSIS — N2581 Secondary hyperparathyroidism of renal origin: Secondary | ICD-10-CM | POA: Diagnosis not present

## 2019-10-05 DIAGNOSIS — N186 End stage renal disease: Secondary | ICD-10-CM | POA: Diagnosis not present

## 2019-10-07 DIAGNOSIS — N2581 Secondary hyperparathyroidism of renal origin: Secondary | ICD-10-CM | POA: Diagnosis not present

## 2019-10-07 DIAGNOSIS — Z992 Dependence on renal dialysis: Secondary | ICD-10-CM | POA: Diagnosis not present

## 2019-10-07 DIAGNOSIS — D631 Anemia in chronic kidney disease: Secondary | ICD-10-CM | POA: Diagnosis not present

## 2019-10-07 DIAGNOSIS — N186 End stage renal disease: Secondary | ICD-10-CM | POA: Diagnosis not present

## 2019-10-07 DIAGNOSIS — D509 Iron deficiency anemia, unspecified: Secondary | ICD-10-CM | POA: Diagnosis not present

## 2019-10-10 DIAGNOSIS — N2581 Secondary hyperparathyroidism of renal origin: Secondary | ICD-10-CM | POA: Diagnosis not present

## 2019-10-10 DIAGNOSIS — D509 Iron deficiency anemia, unspecified: Secondary | ICD-10-CM | POA: Diagnosis not present

## 2019-10-10 DIAGNOSIS — Z992 Dependence on renal dialysis: Secondary | ICD-10-CM | POA: Diagnosis not present

## 2019-10-10 DIAGNOSIS — N186 End stage renal disease: Secondary | ICD-10-CM | POA: Diagnosis not present

## 2019-10-10 DIAGNOSIS — D631 Anemia in chronic kidney disease: Secondary | ICD-10-CM | POA: Diagnosis not present

## 2019-10-12 DIAGNOSIS — N2581 Secondary hyperparathyroidism of renal origin: Secondary | ICD-10-CM | POA: Diagnosis not present

## 2019-10-12 DIAGNOSIS — Z992 Dependence on renal dialysis: Secondary | ICD-10-CM | POA: Diagnosis not present

## 2019-10-12 DIAGNOSIS — D509 Iron deficiency anemia, unspecified: Secondary | ICD-10-CM | POA: Diagnosis not present

## 2019-10-12 DIAGNOSIS — D631 Anemia in chronic kidney disease: Secondary | ICD-10-CM | POA: Diagnosis not present

## 2019-10-12 DIAGNOSIS — N186 End stage renal disease: Secondary | ICD-10-CM | POA: Diagnosis not present

## 2019-10-14 DIAGNOSIS — D509 Iron deficiency anemia, unspecified: Secondary | ICD-10-CM | POA: Diagnosis not present

## 2019-10-14 DIAGNOSIS — N2581 Secondary hyperparathyroidism of renal origin: Secondary | ICD-10-CM | POA: Diagnosis not present

## 2019-10-14 DIAGNOSIS — Z992 Dependence on renal dialysis: Secondary | ICD-10-CM | POA: Diagnosis not present

## 2019-10-14 DIAGNOSIS — N186 End stage renal disease: Secondary | ICD-10-CM | POA: Diagnosis not present

## 2019-10-14 DIAGNOSIS — D631 Anemia in chronic kidney disease: Secondary | ICD-10-CM | POA: Diagnosis not present

## 2019-10-16 DIAGNOSIS — D509 Iron deficiency anemia, unspecified: Secondary | ICD-10-CM | POA: Diagnosis not present

## 2019-10-16 DIAGNOSIS — Z992 Dependence on renal dialysis: Secondary | ICD-10-CM | POA: Diagnosis not present

## 2019-10-16 DIAGNOSIS — N186 End stage renal disease: Secondary | ICD-10-CM | POA: Diagnosis not present

## 2019-10-16 DIAGNOSIS — N2581 Secondary hyperparathyroidism of renal origin: Secondary | ICD-10-CM | POA: Diagnosis not present

## 2019-10-16 DIAGNOSIS — D631 Anemia in chronic kidney disease: Secondary | ICD-10-CM | POA: Diagnosis not present

## 2019-10-19 DIAGNOSIS — D631 Anemia in chronic kidney disease: Secondary | ICD-10-CM | POA: Diagnosis not present

## 2019-10-19 DIAGNOSIS — D509 Iron deficiency anemia, unspecified: Secondary | ICD-10-CM | POA: Diagnosis not present

## 2019-10-19 DIAGNOSIS — N2581 Secondary hyperparathyroidism of renal origin: Secondary | ICD-10-CM | POA: Diagnosis not present

## 2019-10-19 DIAGNOSIS — N186 End stage renal disease: Secondary | ICD-10-CM | POA: Diagnosis not present

## 2019-10-19 DIAGNOSIS — Z992 Dependence on renal dialysis: Secondary | ICD-10-CM | POA: Diagnosis not present

## 2019-10-20 ENCOUNTER — Telehealth: Payer: Self-pay | Admitting: Internal Medicine

## 2019-10-20 NOTE — Telephone Encounter (Signed)
lvm for patient to return call to get follow up scheduled with Hilty from recall list 

## 2019-10-21 DIAGNOSIS — D631 Anemia in chronic kidney disease: Secondary | ICD-10-CM | POA: Diagnosis not present

## 2019-10-21 DIAGNOSIS — D509 Iron deficiency anemia, unspecified: Secondary | ICD-10-CM | POA: Diagnosis not present

## 2019-10-21 DIAGNOSIS — N186 End stage renal disease: Secondary | ICD-10-CM | POA: Diagnosis not present

## 2019-10-21 DIAGNOSIS — N2581 Secondary hyperparathyroidism of renal origin: Secondary | ICD-10-CM | POA: Diagnosis not present

## 2019-10-21 DIAGNOSIS — Z992 Dependence on renal dialysis: Secondary | ICD-10-CM | POA: Diagnosis not present

## 2019-10-22 ENCOUNTER — Other Ambulatory Visit: Payer: Self-pay

## 2019-10-22 ENCOUNTER — Ambulatory Visit (INDEPENDENT_AMBULATORY_CARE_PROVIDER_SITE_OTHER): Payer: Medicare Other | Admitting: Internal Medicine

## 2019-10-22 ENCOUNTER — Encounter: Payer: Self-pay | Admitting: Internal Medicine

## 2019-10-22 VITALS — BP 160/82 | HR 58 | Temp 97.0°F | Ht 67.0 in | Wt 204.6 lb

## 2019-10-22 DIAGNOSIS — E782 Mixed hyperlipidemia: Secondary | ICD-10-CM | POA: Diagnosis not present

## 2019-10-22 DIAGNOSIS — N186 End stage renal disease: Secondary | ICD-10-CM

## 2019-10-22 DIAGNOSIS — F419 Anxiety disorder, unspecified: Secondary | ICD-10-CM

## 2019-10-22 DIAGNOSIS — E1121 Type 2 diabetes mellitus with diabetic nephropathy: Secondary | ICD-10-CM

## 2019-10-22 DIAGNOSIS — M170 Bilateral primary osteoarthritis of knee: Secondary | ICD-10-CM | POA: Diagnosis not present

## 2019-10-22 DIAGNOSIS — Z992 Dependence on renal dialysis: Secondary | ICD-10-CM | POA: Diagnosis not present

## 2019-10-22 DIAGNOSIS — Z1211 Encounter for screening for malignant neoplasm of colon: Secondary | ICD-10-CM

## 2019-10-22 DIAGNOSIS — R195 Other fecal abnormalities: Secondary | ICD-10-CM | POA: Diagnosis not present

## 2019-10-22 DIAGNOSIS — Q8781 Alport syndrome: Secondary | ICD-10-CM | POA: Diagnosis not present

## 2019-10-22 MED ORDER — ONDANSETRON 4 MG PO TBDP: 4.0000 mg | ORAL_TABLET | Freq: Three times a day (TID) | ORAL | 1 refills | Status: DC | PRN

## 2019-10-22 MED ORDER — CLONAZEPAM 0.5 MG PO TABS: 0.5000 mg | ORAL_TABLET | Freq: Two times a day (BID) | ORAL | 0 refills | Status: DC | PRN

## 2019-10-22 MED ORDER — TRAMADOL HCL 50 MG PO TABS: 50.0000 mg | ORAL_TABLET | Freq: Every day | ORAL | 0 refills | Status: DC | PRN

## 2019-10-22 NOTE — Progress Notes (Signed)
Location:  [facility] clinic Provider:  Vergia Chea L. Mariea Clonts, D.O., C.M.D.  Goals of Care:  Advanced Directives 10/22/2019  Does Patient Have a Medical Advance Directive? No  Does patient want to make changes to medical advance directive? -  Would patient like information on creating a medical advance directive? No - Patient declined  Pre-existing out of facility DNR order (yellow form or pink MOST form) -     Chief Complaint  Patient presents with  . Medical Management of Chronic Issues    4 month follow up   . Health Maintenance    Foot exam, tetanus, ALC, influenza,and colonoscopy  . Acute Visit    Rt shoulder pain , right ear ringing x 4 mos     HPI: Patient is a 59 y.o. male seen today for medical management of chronic diseases.    He had his flu shot at dialysis.    Right shoulder hurting in the joint.  No known injury.    Hemorrhoids did improve--had used ice rather than sitz baths, hydrocortisone.  BMs are yellow-orange.  He gets low back pain and then has discolored stool.  He's not eaten anything weird.  His wife is concerned he has an infection.    Refused foot exam today.   Past Medical History:  Diagnosis Date  . Acute edema of lung, unspecified   . Acute, but ill-defined, cerebrovascular disease   . Allergy   . Anemia   . 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)   . Anxiety   . Asthma   . Asthma    moderate persistent  . Carpal tunnel syndrome   . Cellulitis and abscess of trunk   . Cholelithiasis 07/13/2014  . Chronic headaches   . Debility, unspecified   . Dermatophytosis of the body   . Dysrhythmia    history of  . Edema   . End stage renal disease on dialysis [facility])    "MWF; [facility]" (10/21/2014)  . Essential hypertension, benign   . GERD (gastroesophageal reflux disease)   . Gout, unspecified   . HTN (hypertension)   . Hypertrophy of prostate without urinary obstruction and other lower urinary tract symptoms (LUTS)   .  Hypotension, unspecified   . Impotence of organic origin   . Insomnia, unspecified   . Kidney replaced by transplant   . Localization-related (focal) (partial) epilepsy and epileptic syndromes with complex partial seizures, without mention of intractable epilepsy   . Lumbago   . Memory loss   . OSA on CPAP   . Other and unspecified hyperlipidemia   . Other chronic nonalcoholic liver disease   . Other malaise and fatigue   . Other nonspecific abnormal serum enzyme levels   . Pain in joint, lower leg   . Pain in joint, upper arm   . Pneumonia "several times"  . Renal dialysis status(V45.11) 02/05/2010   restarted 01/02/13 ofter renal trransplant failure  . Secondary hyperparathyroidism (of renal origin)   . Shortness of breath   . Sleep apnea   . Tension headache   . Unspecified constipation   . Unspecified essential hypertension   . Unspecified hereditary and idiopathic peripheral neuropathy   . Unspecified vitamin D deficiency     Past Surgical History:  Procedure Laterality Date  . AV FISTULA PLACEMENT Left ?2010   "forearm; at [place] [facility]"  . BACK SURGERY    . CARDIAC CATHETERIZATION  03/21/2011  . CHOLECYSTECTOMY N/A 10/21/2014   Procedure: LAPAROSCOPIC CHOLECYSTECTOMY WITH  INTRAOPERATIVE CHOLANGIOGRAM;  Surgeon: Autumn Messing III, MD;  Location: [place];  Service: General;  Laterality: N/A;  . COLONOSCOPY    . 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 KIDNEY TRANSPLANT  08/17/2011   [place]  10/21/2014   w/IOC  . LEFT HEART CATHETERIZATION WITH CORONARY ANGIOGRAM N/A 03/21/2011   Procedure: LEFT HEART CATHETERIZATION WITH CORONARY ANGIOGRAM;  Surgeon: [name], MD;  Location: [facility] CATH LAB;  Service: Cardiovascular;  Laterality: N/A;  . NEPHRECTOMY  08/2013   removed transplaned kidney  . POSTERIOR FUSION CERVICAL SPINE  06/25/2012   for spinal stenosis  . VASECTOMY  2010    Allergies  Allergen Reactions  .  Codeine Nausea And Vomiting    Outpatient Encounter Medications as of 10/22/2019  Medication Sig  . acetaminophen (TYLENOL) 500 MG tablet Take 1 tablet (500 mg total) by mouth every 6 (six) hours as needed for moderate pain.  Marland Kitchen albuterol (PROVENTIL HFA;VENTOLIN HFA) 108 (90 Base) MCG/ACT inhaler Inhale 2 puffs into the lungs every 6 (six) hours as needed for wheezing or shortness of breath.  Marland Kitchen amLODipine (NORVASC) 10 MG tablet Take 1 tablet (10 mg total) by mouth daily.  Marland Kitchen aspirin EC 81 MG tablet Take 81 mg by mouth daily.  Marland Kitchen b complex-vitamin c-folic acid (NEPHRO-VITE) 0.8 MG TABS tablet TAKE 1 TABLET BY MOUTH EVERY DAY  . budesonide-formoterol (SYMBICORT) 160-4.5 MCG/ACT inhaler Inhale 2 puffs into the lungs 2 (two) times daily.  . calcitRIOL (ROCALTROL) 0.5 MCG capsule Take 2 capsules (1 mcg total) by mouth every Monday, Wednesday, and Friday with hemodialysis.  . Cholecalciferol (VITAMIN D3) 5000 UNITS TABS Take 5,000 Units by mouth daily.   . cinacalcet (SENSIPAR) 90 MG tablet Take 90 mg by mouth every evening.  . cloNIDine (CATAPRES) 0.1 MG tablet Take 0.1 mg by mouth at bedtime.  . diphenhydrAMINE (BENADRYL) 25 MG tablet Take 50 mg by mouth at bedtime.   Marland Kitchen doxazosin (CARDURA) 2 MG tablet Take 2 mg by mouth daily.  . famotidine (PEPCID) 20 MG tablet Take 1 tablet (20 mg total) by mouth at bedtime.  Marland Kitchen guaiFENesin (MUCINEX) 600 MG 12 hr tablet Take 1,200 mg by mouth 2 (two) times daily.  . hydrALAZINE (APRESOLINE) 100 MG tablet Take 1 tablet (100 mg total) by mouth in the morning and at bedtime.  Marland Kitchen ipratropium-albuterol (DUONEB) 0.5-2.5 (3) MG/3ML SOLN Take 3 mLs by nebulization every 4 (four) hours as needed (Shortness of breath).  . lidocaine-prilocaine (EMLA) cream Apply 1 application topically See admin instructions. Apply 1 to 2 hours prior to dialysis on Mondays, Wednesdays, and Fridays. Cover with saran wrap.  . metoprolol tartrate (LOPRESSOR) 100 MG tablet Take 1 tablet (100 mg  total) by mouth 2 (two) times daily.  Marland Kitchen omeprazole (PRILOSEC) 20 MG capsule TAKE 1 CAPSULE TWICE DAILY BEFORE A MEAL  . pravastatin (PRAVACHOL) 40 MG tablet TAKE 1 TABLET BY MOUTH EVERY DAY IN THE EVENING  . PRESCRIPTION MEDICATION See admin instructions. CPAP- At bedtime and during any time of rest  . RENVELA 800 MG tablet Take 800-2,400 mg by mouth See admin instructions. Take 2,400 mg by mouth three times a day with meals and 800 mg with each snack  . sodium zirconium cyclosilicate (LOKELMA) 5 g packet Take 5 g by mouth daily.  . [DISCONTINUED] clonazePAM (KLONOPIN) 0.5 MG tablet Take 1 tablet (0.5 mg total) by mouth 2 (two) times daily as needed for anxiety.  . [  DISCONTINUED] ondansetron (ZOFRAN ODT) 4 MG disintegrating tablet Take 1 tablet (4 mg total) by mouth every 8 (eight) hours as needed for nausea or vomiting.  . [DISCONTINUED] traMADol (ULTRAM) 50 MG tablet Take 1 tablet (50 mg total) by mouth daily as needed.  . clonazePAM (KLONOPIN) 0.5 MG tablet Take 1 tablet (0.5 mg total) by mouth 2 (two) times daily as needed for anxiety.  . isosorbide mononitrate (IMDUR) 30 MG 24 hr tablet Take 1 tablet (30 mg total) by mouth daily.  . ondansetron (ZOFRAN ODT) 4 MG disintegrating tablet Take 1 tablet (4 mg total) by mouth every 8 (eight) hours as needed for nausea or vomiting.  . traMADol (ULTRAM) 50 MG tablet Take 1 tablet (50 mg total) by mouth daily as needed.   No facility-administered encounter medications on file as of 10/22/2019.    Review of Systems:  Review of Systems  Constitutional: Positive for malaise/fatigue. Negative for chills and fever.  HENT: Positive for hearing loss and tinnitus.   Eyes: Negative for blurred vision.  Respiratory: Negative for shortness of breath.   Cardiovascular: Negative for chest pain, palpitations and leg swelling.  Gastrointestinal: Negative for abdominal pain.  Genitourinary:       HD pt  Musculoskeletal: Positive for joint pain. Negative for  falls.       Right shoulder, bilateral knees  Skin: Negative for itching and rash.  Neurological: Negative for dizziness, tingling, sensory change and loss of consciousness.       Refused diabetic foot exam--they say he's not diabetic, but at some point his hba1c was over 6.5, just not in years  Psychiatric/Behavioral: Negative for memory loss. The patient is nervous/anxious and has insomnia.        Wife is now giving him some type of gummies for his sleep--he cannot stay awake during the appt today    Health Maintenance  Topic Date Due  . OPHTHALMOLOGY EXAM  Never done  . FOOT EXAM  04/29/2014  . TETANUS/TDAP  01/23/2016  . HEMOGLOBIN A1C  03/25/2019  . INFLUENZA VACCINE  08/23/2019  . COLONOSCOPY  08/30/2019  . PNEUMOCOCCAL POLYSACCHARIDE VACCINE AGE 66-64 HIGH RISK  Completed  . COVID-19 Vaccine  Completed  . Hepatitis C Screening  Completed  . HIV Screening  Completed    Physical Exam: Vitals:   10/22/19 1455  BP: (!) 160/82  Pulse: (!) 58  Temp: (!) 97 F (36.1 C)  TempSrc: Temporal  SpO2: 98%  Weight: 204 lb 9.6 oz (92.8 kg)  Height: 5\' 7"  (1.702 m)   Body mass index is 32.04 kg/m. Physical Exam Vitals reviewed.  Constitutional:      General: He is not in acute distress.    Appearance: He is not toxic-appearing.     Comments: drowsy  Cardiovascular:     Rate and Rhythm: Normal rate and regular rhythm.     Pulses: Normal pulses.     Heart sounds: Normal heart sounds.  Pulmonary:     Effort: Pulmonary effort is normal.     Breath sounds: Normal breath sounds. No wheezing, rhonchi or rales.  Abdominal:     General: Bowel sounds are normal.  Musculoskeletal:     Right lower leg: No edema.     Left lower leg: No edema.     Comments: Decreased internal rotation, flexion and abduction of right arm (wife plans to take him back to ortho)  Neurological:     Mental Status: Mental status is at baseline.  Psychiatric:  Mood and Affect: Mood normal.      Comments: Disinterested appearance, falling asleep     Labs reviewed: Basic Metabolic Panel: Recent Labs    12/03/18 0333 12/03/18 1910 07/15/19 0704  NA 139 136 139  K 3.9 4.1 5.0  CL 98 95* 101  CO2 24 23 23   GLUCOSE 181* 112* 109*  BUN 53* 29* 45*  CREATININE 13.84* 9.13* 13.39*  CALCIUM 8.6* 9.2 9.0  PHOS  --  5.4*  --    Liver Function Tests: Recent Labs    12/02/18 2031 12/02/18 2031 12/03/18 0333 12/03/18 1910 07/15/19 0704  AST 20  --  16  --  15  ALT 16  --  14  --  15  ALKPHOS 91  --  81  --  57  BILITOT 0.9  --  0.5  --  1.1  PROT 7.6  --  6.8  --  6.7  ALBUMIN 4.0   < > 3.4* 4.0 3.6   < > = values in this interval not displayed.   No results for input(s): LIPASE, AMYLASE in the last 8760 hours. No results for input(s): AMMONIA in the last 8760 hours. CBC: Recent Labs    12/02/18 2031 12/02/18 2052 12/03/18 0333 12/03/18 0333 12/03/18 1910 07/15/19 0704 07/15/19 1431  WBC 6.9   < > 6.0  --  6.3 5.3  --   NEUTROABS 5.5  --   --   --   --   --   --   HGB 13.8   < > 11.9*   < > 13.6 10.4* 10.4*  HCT 44.1   < > 37.8*   < > 43.4 33.2* 33.0*  MCV 95.5   < > 95.7  --  94.3 101.5*  --   PLT 98*   < > 89*  --  86* 99*  --    < > = values in this interval not displayed.   Lipid Panel: No results for input(s): CHOL, HDL, LDLCALC, TRIG, CHOLHDL, LDLDIRECT in the last 8760 hours. Lab Results  Component Value Date   HGBA1C 5.4 09/25/2018   Assessment/Plan 1. Bilateral primary osteoarthritis of knee - continues on his chronic pain medication, steroid injections are not lasting him  - traMADol (ULTRAM) 50 MG tablet; Take 1 tablet (50 mg total) by mouth daily as needed.  Dispense: 30 tablet; Refill: 0  2. Anxiety -longstanding at hs and related to HD - clonazePAM (KLONOPIN) 0.5 MG tablet; Take 1 tablet (0.5 mg total) by mouth 2 (two) times daily as needed for anxiety.  Dispense: 60 tablet; Refill: 0  3. Type 2 diabetes mellitus with diabetic  nephropathy, without long-term current use of insulin (HCC) -control has been fine for several years, on HD - Hemoglobin A1c  4. ESRD on dialysis [facility]) -continues on this, struggling more with it, his wife reports they are doing testing again now to determine if he can get back on the transplant list - CBC with Differential/Platelet  5. 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 Mixed hyperlipidemia - f/u labs - Lipid panel  7. Abnormal stool color -his wife reports his stool is orange-yellow and he agrees for several days--no fever chills and no diet changes - Stool culture; Future - Fecal leukocytes; Future - Clostridium difficile Toxin B, Qualitative, Real-Time PCR([facility]); Future - Ambulatory referral to Gastroenterology  8. Colon cancer screening - wants to switch GIs due to communication from staff - Ambulatory referral to  Gastroenterology  Labs/tests ordered:   Orders Placed This Encounter  Procedures  . Stool culture    Standing Status:   Future    Standing Expiration Date:   11/20/2019    Order Specific Question:   Release to patient    Answer:   Immediate  . Fecal leukocytes    Standing Status:   Future    Standing Expiration Date:   11/20/2019  . CBC with Differential/Platelet  . Hemoglobin A1c    Order Specific Question:   Release to patient    Answer:   Immediate  . Lipid panel    Order Specific Question:   Has the patient fasted?    Answer:   No    Order Specific Question:   Release to patient    Answer:   Immediate  . Clostridium difficile Toxin B, Qualitative, Real-Time PCR([facility])    Standing Status:   Future    Standing Expiration Date:   11/20/2019  . Ambulatory referral to Gastroenterology    Referral Priority:   Routine    Referral Type:   Consultation    Referral Reason:   Specialty Services Required    Number of Visits Requested:   1    Next appt:  02/25/2020   Latreshia Beauchaine L. Imani Fiebelkorn, D.O. [facility] [address], [place] [address] Cell Phone (Mon-Fri 8am-5pm):  [phone] On Call:  [phone] & follow prompts after 5pm & weekends Office Phone:  [phone] Office Fax:  [phone]

## 2019-10-23 ENCOUNTER — Telehealth: Payer: Self-pay | Admitting: Orthopaedic Surgery

## 2019-10-23 ENCOUNTER — Telehealth: Payer: Self-pay | Admitting: Internal Medicine

## 2019-10-23 DIAGNOSIS — N186 End stage renal disease: Secondary | ICD-10-CM | POA: Diagnosis not present

## 2019-10-23 DIAGNOSIS — Z992 Dependence on renal dialysis: Secondary | ICD-10-CM | POA: Diagnosis not present

## 2019-10-23 DIAGNOSIS — D689 Coagulation defect, unspecified: Secondary | ICD-10-CM | POA: Diagnosis not present

## 2019-10-23 DIAGNOSIS — N2581 Secondary hyperparathyroidism of renal origin: Secondary | ICD-10-CM | POA: Diagnosis not present

## 2019-10-23 DIAGNOSIS — D509 Iron deficiency anemia, unspecified: Secondary | ICD-10-CM | POA: Diagnosis not present

## 2019-10-23 DIAGNOSIS — T8612 Kidney transplant failure: Secondary | ICD-10-CM | POA: Diagnosis not present

## 2019-10-23 DIAGNOSIS — D631 Anemia in chronic kidney disease: Secondary | ICD-10-CM | POA: Diagnosis not present

## 2019-10-23 LAB — LIPID PANEL
Cholesterol: 90 mg/dL (ref <200)
HDL: 31 mg/dL — ABNORMAL LOW (ref >=40)
LDL Cholesterol (Calc): 44 mg/dL (calc)
Non-HDL Cholesterol (Calc): 59 mg/dL (calc) (ref <130)
Total CHOL/HDL Ratio: 2.9 (calc) (ref <5.0)
Triglycerides: 74 mg/dL (ref <150)

## 2019-10-23 LAB — CBC WITH DIFFERENTIAL/PLATELET
Absolute Monocytes: 542 cells/uL (ref 200–950)
Basophils Absolute: 60 cells/uL (ref 0–200)
Basophils Relative: 1.4 %
Eosinophils Absolute: 129 cells/uL (ref 15–500)
Eosinophils Relative: 3 %
HCT: 32.8 % — ABNORMAL LOW (ref 38.5–50.0)
Hemoglobin: 10.9 g/dL — ABNORMAL LOW (ref 13.2–17.1)
Lymphs Abs: 740 cells/uL — ABNORMAL LOW (ref 850–3900)
MCH: 32.4 pg (ref 27.0–33.0)
MCHC: 33.2 g/dL (ref 32.0–36.0)
MCV: 97.6 fL (ref 80.0–100.0)
MPV: 12.5 fL (ref 7.5–12.5)
Monocytes Relative: 12.6 %
Neutro Abs: 2829 cells/uL (ref 1500–7800)
Neutrophils Relative %: 65.8 %
Platelets: 74 10*3/uL — ABNORMAL LOW (ref 140–400)
RBC: 3.36 10*6/uL — ABNORMAL LOW (ref 4.20–5.80)
RDW: 13.2 % (ref 11.0–15.0)
Total Lymphocyte: 17.2 %
WBC: 4.3 10*3/uL (ref 3.8–10.8)

## 2019-10-23 LAB — HEMOGLOBIN A1C
Hgb A1c MFr Bld: 5 % of total Hgb (ref <5.7)
Mean Plasma Glucose: 97 (calc)
eAG (mmol/L): 5.4 (calc)

## 2019-10-23 NOTE — Telephone Encounter (Signed)
Yes that is fine

## 2019-10-23 NOTE — Telephone Encounter (Signed)
I spoke with Kirk [name] and gave her lab results, she stated she was not given anything to give a stool sample and will come by on Monday to pick it up. She also wanted a copy of the lab results to share with dialysis. Copy left at front desk.  This is the order that was placed for the stool.   7. Abnormal stool color -his wife reports his stool is orange-yellow and he agrees for several days--no fever chills and no diet changes - Stool culture; Future - Fecal leukocytes; Future - Clostridium difficile Toxin B, Qualitative, Real-Time PCR([facility]); Future - Ambulatory referral to Gastroenterology

## 2019-10-23 NOTE — Telephone Encounter (Signed)
FYI

## 2019-10-23 NOTE — Telephone Encounter (Signed)
Patient's wife [name] called requesting to be present at husband's appt on Oct. 14, @ 8:45 am . [name] states her husband is hard of hearing and wants to make sure she gets instructions from the doctor at his upcoming appt. Patient phone number is [phone].

## 2019-10-23 NOTE — Progress Notes (Signed)
Anemia is stable. Platelets are lower than before at 4 now.  If this drops much lower, he will be prone to bleeding.   Sugar average remains normal since 2018 Cholesterol is much improved down to 74. Await stool studies when he submits them due to abnormal stool color.

## 2019-10-23 NOTE — Telephone Encounter (Signed)
Mechele Claude returned your phone call regarding Bowman Higbie.  Thank you   [employer]

## 2019-10-26 DIAGNOSIS — Z992 Dependence on renal dialysis: Secondary | ICD-10-CM | POA: Diagnosis not present

## 2019-10-26 DIAGNOSIS — D509 Iron deficiency anemia, unspecified: Secondary | ICD-10-CM | POA: Diagnosis not present

## 2019-10-26 DIAGNOSIS — D689 Coagulation defect, unspecified: Secondary | ICD-10-CM | POA: Diagnosis not present

## 2019-10-26 DIAGNOSIS — N2581 Secondary hyperparathyroidism of renal origin: Secondary | ICD-10-CM | POA: Diagnosis not present

## 2019-10-26 DIAGNOSIS — D631 Anemia in chronic kidney disease: Secondary | ICD-10-CM | POA: Diagnosis not present

## 2019-10-26 DIAGNOSIS — N186 End stage renal disease: Secondary | ICD-10-CM | POA: Diagnosis not present

## 2019-10-26 NOTE — Telephone Encounter (Signed)
Noted.  All orders were given to phlebotomy at the time.  Not sure why stool cups were not provided then (unless pt refused when in lab while wife was outside which may have happened--he was not enthuse about collecting a stool sample).

## 2019-10-28 DIAGNOSIS — D509 Iron deficiency anemia, unspecified: Secondary | ICD-10-CM | POA: Diagnosis not present

## 2019-10-28 DIAGNOSIS — N2581 Secondary hyperparathyroidism of renal origin: Secondary | ICD-10-CM | POA: Diagnosis not present

## 2019-10-28 DIAGNOSIS — D631 Anemia in chronic kidney disease: Secondary | ICD-10-CM | POA: Diagnosis not present

## 2019-10-28 DIAGNOSIS — N186 End stage renal disease: Secondary | ICD-10-CM | POA: Diagnosis not present

## 2019-10-28 DIAGNOSIS — D689 Coagulation defect, unspecified: Secondary | ICD-10-CM | POA: Diagnosis not present

## 2019-10-28 DIAGNOSIS — Z992 Dependence on renal dialysis: Secondary | ICD-10-CM | POA: Diagnosis not present

## 2019-10-30 DIAGNOSIS — Z992 Dependence on renal dialysis: Secondary | ICD-10-CM | POA: Diagnosis not present

## 2019-10-30 DIAGNOSIS — D509 Iron deficiency anemia, unspecified: Secondary | ICD-10-CM | POA: Diagnosis not present

## 2019-10-30 DIAGNOSIS — D631 Anemia in chronic kidney disease: Secondary | ICD-10-CM | POA: Diagnosis not present

## 2019-10-30 DIAGNOSIS — N2581 Secondary hyperparathyroidism of renal origin: Secondary | ICD-10-CM | POA: Diagnosis not present

## 2019-10-30 DIAGNOSIS — N186 End stage renal disease: Secondary | ICD-10-CM | POA: Diagnosis not present

## 2019-10-30 DIAGNOSIS — D689 Coagulation defect, unspecified: Secondary | ICD-10-CM | POA: Diagnosis not present

## 2019-11-02 DIAGNOSIS — Z992 Dependence on renal dialysis: Secondary | ICD-10-CM | POA: Diagnosis not present

## 2019-11-02 DIAGNOSIS — N186 End stage renal disease: Secondary | ICD-10-CM | POA: Diagnosis not present

## 2019-11-02 DIAGNOSIS — N2581 Secondary hyperparathyroidism of renal origin: Secondary | ICD-10-CM | POA: Diagnosis not present

## 2019-11-02 DIAGNOSIS — D509 Iron deficiency anemia, unspecified: Secondary | ICD-10-CM | POA: Diagnosis not present

## 2019-11-02 DIAGNOSIS — D689 Coagulation defect, unspecified: Secondary | ICD-10-CM | POA: Diagnosis not present

## 2019-11-02 DIAGNOSIS — D631 Anemia in chronic kidney disease: Secondary | ICD-10-CM | POA: Diagnosis not present

## 2019-11-04 DIAGNOSIS — N186 End stage renal disease: Secondary | ICD-10-CM | POA: Diagnosis not present

## 2019-11-04 DIAGNOSIS — Z992 Dependence on renal dialysis: Secondary | ICD-10-CM | POA: Diagnosis not present

## 2019-11-04 DIAGNOSIS — N2581 Secondary hyperparathyroidism of renal origin: Secondary | ICD-10-CM | POA: Diagnosis not present

## 2019-11-04 DIAGNOSIS — D631 Anemia in chronic kidney disease: Secondary | ICD-10-CM | POA: Diagnosis not present

## 2019-11-04 DIAGNOSIS — D509 Iron deficiency anemia, unspecified: Secondary | ICD-10-CM | POA: Diagnosis not present

## 2019-11-04 DIAGNOSIS — D689 Coagulation defect, unspecified: Secondary | ICD-10-CM | POA: Diagnosis not present

## 2019-11-05 ENCOUNTER — Encounter: Payer: Self-pay | Admitting: Orthopaedic Surgery

## 2019-11-05 ENCOUNTER — Ambulatory Visit (INDEPENDENT_AMBULATORY_CARE_PROVIDER_SITE_OTHER): Payer: Medicare Other | Admitting: Orthopaedic Surgery

## 2019-11-05 VITALS — Ht 67.0 in | Wt 200.0 lb

## 2019-11-05 DIAGNOSIS — G8929 Other chronic pain: Secondary | ICD-10-CM

## 2019-11-05 DIAGNOSIS — M25511 Pain in right shoulder: Secondary | ICD-10-CM

## 2019-11-05 MED ORDER — METHYLPREDNISOLONE ACETATE 40 MG/ML IJ SUSP
40.0000 mg | INTRAMUSCULAR | Status: AC | PRN
  Administered 2019-11-05: 40 mg via INTRA_ARTICULAR

## 2019-11-05 MED ORDER — BUPIVACAINE HCL 0.25 % IJ SOLN
2.0000 mL | INTRAMUSCULAR | Status: AC | PRN
  Administered 2019-11-05: 2 mL via INTRA_ARTICULAR

## 2019-11-05 MED ORDER — LIDOCAINE HCL 2 % IJ SOLN
2.0000 mL | INTRAMUSCULAR | Status: AC | PRN
  Administered 2019-11-05: 2 mL

## 2019-11-05 NOTE — Progress Notes (Signed)
Office Visit Note   Patient: Kirk [name]           Date of Birth: 03/22/1960           MRN: [id number] Visit Date: 11/05/2019              Requested by: Gayland Curry, DO [place],  [place] [address] PCP: Gayland Curry, DO   Assessment & Plan: Visit Diagnoses:  1. Chronic right shoulder pain     Plan: Impression is chronic right shoulder pain concerning for rotator cuff pathology at this point. We did discuss cortisone injection and MRI versus just MRI. The patient would like to proceed with injection and MRI. He will follow up with [place] once this has been completed.  Follow-Up Instructions: Return for after MRI.   Orders:  Orders Placed This Encounter  Procedures  . Large Joint Inj: R subacromial bursa   No orders of the defined types were placed in this encounter.     Procedures: Large Joint Inj: R subacromial bursa on 11/05/2019 8:46 AM Indications: pain Details: 22 G needle Medications: 2 mL lidocaine 2 %; 2 mL bupivacaine 0.25 %; 40 mg methylPREDNISolone acetate 40 MG/ML Outcome: tolerated well, no immediate complications Patient was prepped and draped in the usual sterile fashion.       Clinical Data: No additional findings.   Subjective: Chief Complaint  Patient presents with  . Right Shoulder - Pain    HPI patient is a pleasant 59 year old gentleman who comes in today with recurrent right shoulder pain. He was seen in our office in March of this having had a few months of pain without any acute injury or change in activity. Subacromial injection was performed which helped quite a bit but only lasted for about 4 months. His pain has returned and is significantly worsened. This is affecting his day-to-day activities such as mowing his yard. All of his pain is to the deltoid. Any movement of the shoulder seems to aggravate his symptoms. He has tried tramadol and Tylenol without significant relief.  Review of Systems as detailed in HPI. All  others reviewed and are negative.   Objective: Vital Signs: Ht 5\' 7"  (1.702 m)   Wt 200 lb (90.7 kg)   BMI 31.32 kg/m   Physical Exam well-developed well-nourished gentleman in no acute distress. Alert oriented x3.  Ortho Exam right shoulder exam shows forward flexion to about 150 degrees. He can internally rotate to his back pocket. Markedly positive empty can with 3 out of 5 strength. Is increased pain and decreased strength with resisted internal and external rotation. Negative drop arm. He is neurovascular intact distally.  Specialty Comments:  No specialty comments available.  Imaging: No new imaging   PMFS History: Patient Active Problem List   Diagnosis Date Noted  . Dyspnea and respiratory abnormalities 12/02/2018  . Chronic systolic heart failure ([place]) 09/25/2018  . Moderate persistent asthma without complication 79/02/4095  . Fluid overload 09/17/2018  . Hypertensive urgency 09/17/2018  . Right knee pain 06/02/2018  . Right tibial fracture 06/02/2018  . Cigarette smoker 07/11/2017  . Anxiety 05/30/2017  . Hypokalemia   . 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  . Shunt malfunction 07/03/2016  . Need for hepatitis C screening test 06/29/2015  . Myalgia 05/17/2015  . 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 Obesity hypoventilation syndrome ([place]) 12/09/2014  . Narcotic drug use 12/09/2014  . Hypersomnia with sleep apnea 12/09/2014  . SCC (squamous cell carcinoma),  arm 11/09/2014  . Mild persistent chronic asthma without complication 02/72/5366  . ESRD on dialysis ([place]) 09/09/2014  . Essential hypertension 09/09/2014  . HLD (hyperlipidemia) 09/09/2014  . Pancytopenia ([place]) 04/28/2014  . Thrombocytopenia ([place]) 04/10/2014  . Fungal dermatitis 03/17/2014  . S/p nephrectomy 09/17/2013  . Anemia 09/03/2012  . Uncontrolled hypertension   . Tension headache   . Memory loss   . Edema   . Thoracic or lumbosacral neuritis or radiculitis 03/27/2012  . Nerve root pain  03/27/2012  . History of kidney transplant 09/10/2011  . Type 2 diabetes mellitus with diabetic nephropathy, without long-term current use of insulin ([place]) 08/30/2011  . Hearing loss 08/16/2011  . Obesity 08/16/2011  . OSA on CPAP 08/05/2011  . GIB (gastrointestinal bleeding) 10/28/2007   Past Medical History:  Diagnosis Date  . Acute edema of lung, unspecified   . Acute, but ill-defined, cerebrovascular disease   . Allergy   . Anemia   . 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)   . Anxiety   . Asthma   . Asthma    moderate persistent  . Carpal tunnel syndrome   . Cellulitis and abscess of trunk   . Cholelithiasis 07/13/2014  . Chronic headaches   . Debility, unspecified   . Dermatophytosis of the body   . Dysrhythmia    history of  . Edema   . End stage renal disease on dialysis [facility])    "MWF; [facility]" (10/21/2014)  . Essential hypertension, benign   . GERD (gastroesophageal reflux disease)   . Gout, unspecified   . HTN (hypertension)   . Hypertrophy of prostate without urinary obstruction and other lower urinary tract symptoms (LUTS)   . Hypotension, unspecified   . Impotence of organic origin   . Insomnia, unspecified   . Kidney replaced by transplant   . Localization-related (focal) (partial) epilepsy and epileptic syndromes with complex partial seizures, without mention of intractable epilepsy   . Lumbago   . Memory loss   . OSA on CPAP   . Other and unspecified hyperlipidemia   . Other chronic nonalcoholic liver disease   . Other malaise and fatigue   . Other nonspecific abnormal serum enzyme levels   . Pain in joint, lower leg   . Pain in joint, upper arm   . Pneumonia "several times"  . Renal dialysis status(V45.11) 02/05/2010   restarted 01/02/13 ofter renal trransplant failure  . Secondary hyperparathyroidism (of renal origin)   . Shortness of breath   . Sleep apnea   . Tension headache   . Unspecified constipation   . Unspecified  essential hypertension   . Unspecified hereditary and idiopathic peripheral neuropathy   . Unspecified vitamin D deficiency     Family History  Adopted: Yes  Problem Relation Age of Onset  . Colon cancer Neg Hx   . Esophageal cancer Neg Hx   . Rectal cancer Neg Hx   . Stomach cancer Neg Hx     Past Surgical History:  Procedure Laterality Date  . AV FISTULA PLACEMENT Left ?2010   "forearm; at [place] [facility]"  . BACK SURGERY    . CARDIAC CATHETERIZATION  03/21/2011  . CHOLECYSTECTOMY N/A 10/21/2014   Procedure: LAPAROSCOPIC CHOLECYSTECTOMY WITH INTRAOPERATIVE CHOLANGIOGRAM;  Surgeon: Autumn Messing III, MD;  Location: [place];  Service: General;  Laterality: N/A;  . COLONOSCOPY    . 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 KIDNEY TRANSPLANT  08/17/2011   [facility]  10/21/2014   w/IOC  . LEFT HEART CATHETERIZATION WITH CORONARY ANGIOGRAM N/A 03/21/2011   Procedure: LEFT HEART CATHETERIZATION WITH CORONARY ANGIOGRAM;  Surgeon: [name], MD;  [facility] C[facility] LAB;  Service: Cardiovascular;  Laterality: N/A;  . NEPHRECTOMY  08/2013   removed transplaned kidney  . POSTERIOR FUSION CERVICAL SPINE  06/25/2012   for spinal stenosis  . VASECTOMY  2010   Social History   Occupational History  . Occupation: disabled    Employer: DISABLED  Tobacco Use  . Smoking status: Current Every Day Smoker    Packs/day: 0.50    Years: 32.00    Pack years: 16.00    Types: Cigarettes  . Smokeless tobacco: Never Used  . Tobacco comment: 3 cigs per day 08/06/2019  Vaping Use  . Vaping Use: Never used  Substance and Sexual Activity  . Alcohol use: No    Alcohol/week: 0.0 standard drinks  . Drug use: No  . Sexual activity: Yes

## 2019-11-06 DIAGNOSIS — Z992 Dependence on renal dialysis: Secondary | ICD-10-CM | POA: Diagnosis not present

## 2019-11-06 DIAGNOSIS — D509 Iron deficiency anemia, unspecified: Secondary | ICD-10-CM | POA: Diagnosis not present

## 2019-11-06 DIAGNOSIS — D631 Anemia in chronic kidney disease: Secondary | ICD-10-CM | POA: Diagnosis not present

## 2019-11-06 DIAGNOSIS — N186 End stage renal disease: Secondary | ICD-10-CM | POA: Diagnosis not present

## 2019-11-06 DIAGNOSIS — D689 Coagulation defect, unspecified: Secondary | ICD-10-CM | POA: Diagnosis not present

## 2019-11-06 DIAGNOSIS — N2581 Secondary hyperparathyroidism of renal origin: Secondary | ICD-10-CM | POA: Diagnosis not present

## 2019-11-09 DIAGNOSIS — N186 End stage renal disease: Secondary | ICD-10-CM | POA: Diagnosis not present

## 2019-11-09 DIAGNOSIS — D689 Coagulation defect, unspecified: Secondary | ICD-10-CM | POA: Diagnosis not present

## 2019-11-09 DIAGNOSIS — D631 Anemia in chronic kidney disease: Secondary | ICD-10-CM | POA: Diagnosis not present

## 2019-11-09 DIAGNOSIS — N2581 Secondary hyperparathyroidism of renal origin: Secondary | ICD-10-CM | POA: Diagnosis not present

## 2019-11-09 DIAGNOSIS — D509 Iron deficiency anemia, unspecified: Secondary | ICD-10-CM | POA: Diagnosis not present

## 2019-11-09 DIAGNOSIS — Z992 Dependence on renal dialysis: Secondary | ICD-10-CM | POA: Diagnosis not present

## 2019-11-10 ENCOUNTER — Other Ambulatory Visit: Payer: Self-pay | Admitting: Internal Medicine

## 2019-11-10 NOTE — Telephone Encounter (Signed)
Rx has been sent to the pharmacy electronically. ° °

## 2019-11-11 DIAGNOSIS — D509 Iron deficiency anemia, unspecified: Secondary | ICD-10-CM | POA: Diagnosis not present

## 2019-11-11 DIAGNOSIS — Z992 Dependence on renal dialysis: Secondary | ICD-10-CM | POA: Diagnosis not present

## 2019-11-11 DIAGNOSIS — N2581 Secondary hyperparathyroidism of renal origin: Secondary | ICD-10-CM | POA: Diagnosis not present

## 2019-11-11 DIAGNOSIS — D689 Coagulation defect, unspecified: Secondary | ICD-10-CM | POA: Diagnosis not present

## 2019-11-11 DIAGNOSIS — N186 End stage renal disease: Secondary | ICD-10-CM | POA: Diagnosis not present

## 2019-11-11 DIAGNOSIS — D631 Anemia in chronic kidney disease: Secondary | ICD-10-CM | POA: Diagnosis not present

## 2019-11-13 DIAGNOSIS — N2581 Secondary hyperparathyroidism of renal origin: Secondary | ICD-10-CM | POA: Diagnosis not present

## 2019-11-13 DIAGNOSIS — D631 Anemia in chronic kidney disease: Secondary | ICD-10-CM | POA: Diagnosis not present

## 2019-11-13 DIAGNOSIS — N186 End stage renal disease: Secondary | ICD-10-CM | POA: Diagnosis not present

## 2019-11-13 DIAGNOSIS — D689 Coagulation defect, unspecified: Secondary | ICD-10-CM | POA: Diagnosis not present

## 2019-11-13 DIAGNOSIS — D509 Iron deficiency anemia, unspecified: Secondary | ICD-10-CM | POA: Diagnosis not present

## 2019-11-13 DIAGNOSIS — Z992 Dependence on renal dialysis: Secondary | ICD-10-CM | POA: Diagnosis not present

## 2019-11-14 ENCOUNTER — Other Ambulatory Visit: Payer: Self-pay

## 2019-11-14 ENCOUNTER — Ambulatory Visit
Admission: RE | Admit: 2019-11-14 | Discharge: 2019-11-14 | Disposition: A | Discharge status: Home / Self Care | Payer: Medicare Other | Source: Ambulatory Visit | Attending: Orthopaedic Surgery | Admitting: Orthopaedic Surgery

## 2019-11-14 DIAGNOSIS — M25511 Pain in right shoulder: Secondary | ICD-10-CM

## 2019-11-14 DIAGNOSIS — G8929 Other chronic pain: Secondary | ICD-10-CM

## 2019-11-16 ENCOUNTER — Encounter: Payer: Self-pay | Admitting: Gastroenterology

## 2019-11-16 ENCOUNTER — Telehealth: Payer: Self-pay | Admitting: Internal Medicine

## 2019-11-16 DIAGNOSIS — D509 Iron deficiency anemia, unspecified: Secondary | ICD-10-CM | POA: Diagnosis not present

## 2019-11-16 DIAGNOSIS — N2581 Secondary hyperparathyroidism of renal origin: Secondary | ICD-10-CM | POA: Diagnosis not present

## 2019-11-16 DIAGNOSIS — Z992 Dependence on renal dialysis: Secondary | ICD-10-CM | POA: Diagnosis not present

## 2019-11-16 DIAGNOSIS — N186 End stage renal disease: Secondary | ICD-10-CM | POA: Diagnosis not present

## 2019-11-16 DIAGNOSIS — D689 Coagulation defect, unspecified: Secondary | ICD-10-CM | POA: Diagnosis not present

## 2019-11-16 DIAGNOSIS — D631 Anemia in chronic kidney disease: Secondary | ICD-10-CM | POA: Diagnosis not present

## 2019-11-16 NOTE — Telephone Encounter (Signed)
No need to pick up the stool cups if it's cleared up.

## 2019-11-16 NOTE — Telephone Encounter (Signed)
I spoke with [name] she stated that they did not come and pick up kit because they are having car trouble. She also said that she will try to come today or tomorrow, but as far as she knows it has has cleared up. She will call back to verify.  

## 2019-11-16 NOTE — Telephone Encounter (Signed)
Please call Kirk [name] was having loose orange stools when seen and I ordered a whole set of tests for his stool, but it appears he never brought the samples back in the cups provided.  Is he still having a problem with loose stools?  If not, I'll cancel the orders.  If so, he should bring in the sample asap b/c it's been weeks now.

## 2019-11-17 ENCOUNTER — Ambulatory Visit (INDEPENDENT_AMBULATORY_CARE_PROVIDER_SITE_OTHER): Payer: Medicare Other | Admitting: Orthopaedic Surgery

## 2019-11-17 ENCOUNTER — Encounter: Payer: Self-pay | Admitting: Orthopaedic Surgery

## 2019-11-17 ENCOUNTER — Other Ambulatory Visit: Payer: Self-pay

## 2019-11-17 DIAGNOSIS — M7541 Impingement syndrome of right shoulder: Secondary | ICD-10-CM | POA: Diagnosis not present

## 2019-11-17 DIAGNOSIS — M75101 Unspecified rotator cuff tear or rupture of right shoulder, not specified as traumatic: Secondary | ICD-10-CM

## 2019-11-17 DIAGNOSIS — M67921 Unspecified disorder of synovium and tendon, right upper arm: Secondary | ICD-10-CM

## 2019-11-17 DIAGNOSIS — S43431A Superior glenoid labrum lesion of right shoulder, initial encounter: Secondary | ICD-10-CM

## 2019-11-17 NOTE — Progress Notes (Signed)
Office Visit Note   Patient: Kirk [name]           Date of Birth: 1960/04/14           MRN: [id number] Visit Date: 11/17/2019              Requested by: Gayland Curry, DO Home,  [place] [address] PCP: Gayland Curry, DO   Assessment & Plan: Visit Diagnoses:  1. Rotator cuff syndrome of right shoulder   2. Biceps tendinopathy, right   3. Impingement syndrome of right shoulder   4. Degenerative superior labral anterior-to-posterior (SLAP) tear of right shoulder     Plan: MRI shows significant tendinosis of the supraspinatus with a undersurface tear.  He has severe tendinopathy of the biceps tendon.  AC joint arthrosis.  Extensive degenerative tearing of the superior labrum.  All these findings were reviewed with the patient and his wife in detail.  Recommendation has been made for arthroscopic evaluation and debridement and repairs as indicated.  Patient is not a heavy labor therefore will perform tenotomy of the biceps tendon.  We will obtain clearance from PCP and nephrologist first.  We will contact the patient once we have those to schedule the surgery.  Follow-Up Instructions: Return if symptoms worsen or fail to improve.   Orders:  No orders of the defined types were placed in this encounter.  No orders of the defined types were placed in this encounter.     Procedures: No procedures performed   Clinical Data: No additional findings.   Subjective: Chief Complaint  Patient presents with  . Right Shoulder - Pain    Preciliano returns today for MRI review.  Continues to have severe pain in the right shoulder.   Review of Systems  Constitutional: Negative.   All other systems reviewed and are negative.    Objective: Vital Signs: There were no vitals taken for this visit.  Physical Exam Vitals and nursing note reviewed.  Constitutional:      Appearance: He is well-developed.  Pulmonary:     Effort: Pulmonary effort is normal.  Abdominal:       Palpations: Abdomen is soft.  Skin:    General: Skin is warm.  Neurological:     Mental Status: He is alert and oriented to person, place, and time.  Psychiatric:        Behavior: Behavior normal.        Thought Content: Thought content normal.        Judgment: Judgment normal.     Ortho Exam Right shoulder exam shows positive empty can.  Positive O'Brien.  Tenderness of the bicipital groove.  Positive Hawkins. Specialty Comments:  No specialty comments available.  Imaging: No results found.   PMFS History: Patient Active Problem List   Diagnosis Date Noted  . Rotator cuff syndrome of right shoulder 11/17/2019  . Dyspnea and respiratory abnormalities 12/02/2018  . Chronic systolic heart failure ([place]) 09/25/2018  . Moderate persistent asthma without complication 37/16/9678  . Fluid overload 09/17/2018  . Hypertensive urgency 09/17/2018  . Right knee pain 06/02/2018  . Right tibial fracture 06/02/2018  . Cigarette smoker 07/11/2017  . Anxiety 05/30/2017  . Hypokalemia   . 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  . Shunt malfunction 07/03/2016  . Need for hepatitis C screening test 06/29/2015  . Myalgia 05/17/2015  . 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 Obesity hypoventilation syndrome ([place]) 12/09/2014  . Narcotic drug use 12/09/2014  . Hypersomnia  with sleep apnea 12/09/2014  . SCC (squamous cell carcinoma), arm 11/09/2014  . Mild persistent chronic asthma without complication 33/35/4562  . ESRD on dialysis ([place]) 09/09/2014  . Essential hypertension 09/09/2014  . HLD (hyperlipidemia) 09/09/2014  . Pancytopenia ([place]) 04/28/2014  . Thrombocytopenia ([place]) 04/10/2014  . Fungal dermatitis 03/17/2014  . S/p nephrectomy 09/17/2013  . Anemia 09/03/2012  . Uncontrolled hypertension   . Tension headache   . Memory loss   . Edema   . Thoracic or lumbosacral neuritis or radiculitis 03/27/2012  . Nerve root pain 03/27/2012  . History of kidney transplant 09/10/2011  .  Type 2 diabetes mellitus with diabetic nephropathy, without long-term current use of insulin ([place]) 08/30/2011  . Hearing loss 08/16/2011  . Obesity 08/16/2011  . OSA on CPAP 08/05/2011  . GIB (gastrointestinal bleeding) 10/28/2007   Past Medical History:  Diagnosis Date  . Acute edema of lung, unspecified   . Acute, but ill-defined, cerebrovascular disease   . Allergy   . Anemia   . 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)   . Anxiety   . Asthma   . Asthma    moderate persistent  . Carpal tunnel syndrome   . Cellulitis and abscess of trunk   . Cholelithiasis 07/13/2014  . Chronic headaches   . Debility, unspecified   . Dermatophytosis of the body   . Dysrhythmia    history of  . Edema   . End stage renal disease on dialysis [facility])    "MWF; [facility]" (10/21/2014)  . Essential hypertension, benign   . GERD (gastroesophageal reflux disease)   . Gout, unspecified   . HTN (hypertension)   . Hypertrophy of prostate without urinary obstruction and other lower urinary tract symptoms (LUTS)   . Hypotension, unspecified   . Impotence of organic origin   . Insomnia, unspecified   . Kidney replaced by transplant   . Localization-related (focal) (partial) epilepsy and epileptic syndromes with complex partial seizures, without mention of intractable epilepsy   . Lumbago   . Memory loss   . OSA on CPAP   . Other and unspecified hyperlipidemia   . Other chronic nonalcoholic liver disease   . Other malaise and fatigue   . Other nonspecific abnormal serum enzyme levels   . Pain in joint, lower leg   . Pain in joint, upper arm   . Pneumonia "several times"  . Renal dialysis status(V45.11) 02/05/2010   restarted 01/02/13 ofter renal trransplant failure  . Secondary hyperparathyroidism (of renal origin)   . Shortness of breath   . Sleep apnea   . Tension headache   . Unspecified constipation   . Unspecified essential hypertension   . Unspecified hereditary and  idiopathic peripheral neuropathy   . Unspecified vitamin D deficiency     Family History  Adopted: Yes  Problem Relation Age of Onset  . Colon cancer Neg Hx   . Esophageal cancer Neg Hx   . Rectal cancer Neg Hx   . Stomach cancer Neg Hx     Past Surgical History:  Procedure Laterality Date  . AV FISTULA PLACEMENT Left ?2010   "forearm; at [facility]"  . BACK SURGERY    . CARDIAC CATHETERIZATION  03/21/2011  . CHOLECYSTECTOMY N/A 10/21/2014   Procedure: LAPAROSCOPIC CHOLECYSTECTOMY WITH INTRAOPERATIVE CHOLANGIOGRAM;  Surgeon: Autumn Messing III, MD;  Location: [facility];  Service: General;  Laterality: N/A;  . COLONOSCOPY    . INNER EAR SURGERY Bilateral 1973   for  deafness  . KIDNEY TRANSPLANT  08/17/2011   [place]  10/21/2014   w/IOC  . LEFT HEART CATHETERIZATION WITH CORONARY ANGIOGRAM N/A 03/21/2011   Procedure: LEFT HEART CATHETERIZATION WITH CORONARY ANGIOGRAM;  Surgeon: [name], MD;  [facility] CATH LAB;  Service: Cardiovascular;  Laterality: N/A;  . NEPHRECTOMY  08/2013   removed transplaned kidney  . POSTERIOR FUSION CERVICAL SPINE  06/25/2012   for spinal stenosis  . VASECTOMY  2010   Social History   Occupational History  . Occupation: disabled    Employer: DISABLED  Tobacco Use  . Smoking status: Current Every Day Smoker    Packs/day: 0.50    Years: 32.00    Pack years: 16.00    Types: Cigarettes  . Smokeless tobacco: Never Used  . Tobacco comment: 3 cigs per day 08/06/2019  Vaping Use  . Vaping Use: Never used  Substance and Sexual Activity  . Alcohol use: No    Alcohol/week: 0.0 standard drinks  . Drug use: No  . Sexual activity: Yes

## 2019-11-18 DIAGNOSIS — N2581 Secondary hyperparathyroidism of renal origin: Secondary | ICD-10-CM | POA: Diagnosis not present

## 2019-11-18 DIAGNOSIS — D509 Iron deficiency anemia, unspecified: Secondary | ICD-10-CM | POA: Diagnosis not present

## 2019-11-18 DIAGNOSIS — D689 Coagulation defect, unspecified: Secondary | ICD-10-CM | POA: Diagnosis not present

## 2019-11-18 DIAGNOSIS — Z992 Dependence on renal dialysis: Secondary | ICD-10-CM | POA: Diagnosis not present

## 2019-11-18 DIAGNOSIS — N186 End stage renal disease: Secondary | ICD-10-CM | POA: Diagnosis not present

## 2019-11-18 DIAGNOSIS — D631 Anemia in chronic kidney disease: Secondary | ICD-10-CM | POA: Diagnosis not present

## 2019-11-20 DIAGNOSIS — Z992 Dependence on renal dialysis: Secondary | ICD-10-CM | POA: Diagnosis not present

## 2019-11-20 DIAGNOSIS — N2581 Secondary hyperparathyroidism of renal origin: Secondary | ICD-10-CM | POA: Diagnosis not present

## 2019-11-20 DIAGNOSIS — D509 Iron deficiency anemia, unspecified: Secondary | ICD-10-CM | POA: Diagnosis not present

## 2019-11-20 DIAGNOSIS — D631 Anemia in chronic kidney disease: Secondary | ICD-10-CM | POA: Diagnosis not present

## 2019-11-20 DIAGNOSIS — N186 End stage renal disease: Secondary | ICD-10-CM | POA: Diagnosis not present

## 2019-11-20 DIAGNOSIS — D689 Coagulation defect, unspecified: Secondary | ICD-10-CM | POA: Diagnosis not present

## 2019-11-20 NOTE — Telephone Encounter (Signed)
Kirk [name] called back and said it has all cleared up.

## 2019-11-23 DIAGNOSIS — D509 Iron deficiency anemia, unspecified: Secondary | ICD-10-CM | POA: Diagnosis not present

## 2019-11-23 DIAGNOSIS — Z992 Dependence on renal dialysis: Secondary | ICD-10-CM | POA: Diagnosis not present

## 2019-11-23 DIAGNOSIS — T8612 Kidney transplant failure: Secondary | ICD-10-CM | POA: Diagnosis not present

## 2019-11-23 DIAGNOSIS — D631 Anemia in chronic kidney disease: Secondary | ICD-10-CM | POA: Diagnosis not present

## 2019-11-23 DIAGNOSIS — N186 End stage renal disease: Secondary | ICD-10-CM | POA: Diagnosis not present

## 2019-11-23 DIAGNOSIS — N2581 Secondary hyperparathyroidism of renal origin: Secondary | ICD-10-CM | POA: Diagnosis not present

## 2019-11-24 ENCOUNTER — Telehealth: Payer: Self-pay | Admitting: Orthopaedic Surgery

## 2019-11-24 ENCOUNTER — Telehealth: Payer: Self-pay | Admitting: Internal Medicine

## 2019-11-24 DIAGNOSIS — N186 End stage renal disease: Secondary | ICD-10-CM | POA: Diagnosis not present

## 2019-11-24 DIAGNOSIS — N2581 Secondary hyperparathyroidism of renal origin: Secondary | ICD-10-CM | POA: Diagnosis not present

## 2019-11-24 DIAGNOSIS — D509 Iron deficiency anemia, unspecified: Secondary | ICD-10-CM | POA: Diagnosis not present

## 2019-11-24 DIAGNOSIS — D631 Anemia in chronic kidney disease: Secondary | ICD-10-CM | POA: Diagnosis not present

## 2019-11-24 DIAGNOSIS — Z992 Dependence on renal dialysis: Secondary | ICD-10-CM | POA: Diagnosis not present

## 2019-11-24 NOTE — Telephone Encounter (Signed)
Pt called stating he would like to post pone his surgery due to wanting to make sure he will still be able to work.   [phone]

## 2019-11-24 NOTE — Telephone Encounter (Signed)
I called Kirk [name] about surgery clearance and [name] answered and said they were going to hold off on the surgery for now so we did not schedule an upcoming appointment for that paperwork to be filled out.

## 2019-11-25 DIAGNOSIS — K029 Dental caries, unspecified: Secondary | ICD-10-CM | POA: Diagnosis not present

## 2019-11-25 DIAGNOSIS — K05322 Chronic periodontitis, generalized, moderate: Secondary | ICD-10-CM | POA: Diagnosis not present

## 2019-11-25 DIAGNOSIS — Z992 Dependence on renal dialysis: Secondary | ICD-10-CM | POA: Diagnosis not present

## 2019-11-25 DIAGNOSIS — T8611 Kidney transplant rejection: Secondary | ICD-10-CM | POA: Diagnosis not present

## 2019-11-25 DIAGNOSIS — N186 End stage renal disease: Secondary | ICD-10-CM | POA: Diagnosis not present

## 2019-11-25 DIAGNOSIS — K047 Periapical abscess without sinus: Secondary | ICD-10-CM | POA: Diagnosis not present

## 2019-11-27 DIAGNOSIS — Z992 Dependence on renal dialysis: Secondary | ICD-10-CM | POA: Diagnosis not present

## 2019-11-27 DIAGNOSIS — N186 End stage renal disease: Secondary | ICD-10-CM | POA: Diagnosis not present

## 2019-11-27 DIAGNOSIS — N2581 Secondary hyperparathyroidism of renal origin: Secondary | ICD-10-CM | POA: Diagnosis not present

## 2019-11-27 DIAGNOSIS — D631 Anemia in chronic kidney disease: Secondary | ICD-10-CM | POA: Diagnosis not present

## 2019-11-27 DIAGNOSIS — D509 Iron deficiency anemia, unspecified: Secondary | ICD-10-CM | POA: Diagnosis not present

## 2019-11-30 DIAGNOSIS — D509 Iron deficiency anemia, unspecified: Secondary | ICD-10-CM | POA: Diagnosis not present

## 2019-11-30 DIAGNOSIS — N186 End stage renal disease: Secondary | ICD-10-CM | POA: Diagnosis not present

## 2019-11-30 DIAGNOSIS — N2581 Secondary hyperparathyroidism of renal origin: Secondary | ICD-10-CM | POA: Diagnosis not present

## 2019-11-30 DIAGNOSIS — D631 Anemia in chronic kidney disease: Secondary | ICD-10-CM | POA: Diagnosis not present

## 2019-11-30 DIAGNOSIS — Z992 Dependence on renal dialysis: Secondary | ICD-10-CM | POA: Diagnosis not present

## 2019-12-01 DIAGNOSIS — E8770 Fluid overload, unspecified: Secondary | ICD-10-CM | POA: Diagnosis not present

## 2019-12-01 DIAGNOSIS — Z992 Dependence on renal dialysis: Secondary | ICD-10-CM | POA: Diagnosis not present

## 2019-12-01 DIAGNOSIS — N186 End stage renal disease: Secondary | ICD-10-CM | POA: Diagnosis not present

## 2019-12-01 DIAGNOSIS — N2581 Secondary hyperparathyroidism of renal origin: Secondary | ICD-10-CM | POA: Diagnosis not present

## 2019-12-02 DIAGNOSIS — Z992 Dependence on renal dialysis: Secondary | ICD-10-CM | POA: Diagnosis not present

## 2019-12-02 DIAGNOSIS — N2581 Secondary hyperparathyroidism of renal origin: Secondary | ICD-10-CM | POA: Diagnosis not present

## 2019-12-02 DIAGNOSIS — D509 Iron deficiency anemia, unspecified: Secondary | ICD-10-CM | POA: Diagnosis not present

## 2019-12-02 DIAGNOSIS — D631 Anemia in chronic kidney disease: Secondary | ICD-10-CM | POA: Diagnosis not present

## 2019-12-02 DIAGNOSIS — N186 End stage renal disease: Secondary | ICD-10-CM | POA: Diagnosis not present

## 2019-12-04 DIAGNOSIS — D509 Iron deficiency anemia, unspecified: Secondary | ICD-10-CM | POA: Diagnosis not present

## 2019-12-04 DIAGNOSIS — D631 Anemia in chronic kidney disease: Secondary | ICD-10-CM | POA: Diagnosis not present

## 2019-12-04 DIAGNOSIS — N186 End stage renal disease: Secondary | ICD-10-CM | POA: Diagnosis not present

## 2019-12-04 DIAGNOSIS — N2581 Secondary hyperparathyroidism of renal origin: Secondary | ICD-10-CM | POA: Diagnosis not present

## 2019-12-04 DIAGNOSIS — Z992 Dependence on renal dialysis: Secondary | ICD-10-CM | POA: Diagnosis not present

## 2019-12-07 DIAGNOSIS — N186 End stage renal disease: Secondary | ICD-10-CM | POA: Diagnosis not present

## 2019-12-07 DIAGNOSIS — D509 Iron deficiency anemia, unspecified: Secondary | ICD-10-CM | POA: Diagnosis not present

## 2019-12-07 DIAGNOSIS — N2581 Secondary hyperparathyroidism of renal origin: Secondary | ICD-10-CM | POA: Diagnosis not present

## 2019-12-07 DIAGNOSIS — D631 Anemia in chronic kidney disease: Secondary | ICD-10-CM | POA: Diagnosis not present

## 2019-12-07 DIAGNOSIS — Z992 Dependence on renal dialysis: Secondary | ICD-10-CM | POA: Diagnosis not present

## 2019-12-09 DIAGNOSIS — N186 End stage renal disease: Secondary | ICD-10-CM | POA: Diagnosis not present

## 2019-12-09 DIAGNOSIS — D509 Iron deficiency anemia, unspecified: Secondary | ICD-10-CM | POA: Diagnosis not present

## 2019-12-09 DIAGNOSIS — D631 Anemia in chronic kidney disease: Secondary | ICD-10-CM | POA: Diagnosis not present

## 2019-12-09 DIAGNOSIS — N2581 Secondary hyperparathyroidism of renal origin: Secondary | ICD-10-CM | POA: Diagnosis not present

## 2019-12-09 DIAGNOSIS — Z992 Dependence on renal dialysis: Secondary | ICD-10-CM | POA: Diagnosis not present

## 2019-12-10 ENCOUNTER — Other Ambulatory Visit: Payer: Self-pay | Admitting: Internal Medicine

## 2019-12-10 DIAGNOSIS — E785 Hyperlipidemia, unspecified: Secondary | ICD-10-CM

## 2019-12-10 DIAGNOSIS — I10 Essential (primary) hypertension: Secondary | ICD-10-CM

## 2019-12-10 DIAGNOSIS — M170 Bilateral primary osteoarthritis of knee: Secondary | ICD-10-CM

## 2019-12-11 DIAGNOSIS — D631 Anemia in chronic kidney disease: Secondary | ICD-10-CM | POA: Diagnosis not present

## 2019-12-11 DIAGNOSIS — D509 Iron deficiency anemia, unspecified: Secondary | ICD-10-CM | POA: Diagnosis not present

## 2019-12-11 DIAGNOSIS — N2581 Secondary hyperparathyroidism of renal origin: Secondary | ICD-10-CM | POA: Diagnosis not present

## 2019-12-11 DIAGNOSIS — Z992 Dependence on renal dialysis: Secondary | ICD-10-CM | POA: Diagnosis not present

## 2019-12-11 DIAGNOSIS — N186 End stage renal disease: Secondary | ICD-10-CM | POA: Diagnosis not present

## 2019-12-11 NOTE — Telephone Encounter (Signed)
Pharmacy requested refill Epic LR: 10/22/2019 No Contract on File. Note added to next appointment.  Pended Rx and sent to Dr. Mariea Clonts for approval.

## 2019-12-13 DIAGNOSIS — Z992 Dependence on renal dialysis: Secondary | ICD-10-CM | POA: Diagnosis not present

## 2019-12-13 DIAGNOSIS — D631 Anemia in chronic kidney disease: Secondary | ICD-10-CM | POA: Diagnosis not present

## 2019-12-13 DIAGNOSIS — N2581 Secondary hyperparathyroidism of renal origin: Secondary | ICD-10-CM | POA: Diagnosis not present

## 2019-12-13 DIAGNOSIS — N186 End stage renal disease: Secondary | ICD-10-CM | POA: Diagnosis not present

## 2019-12-13 DIAGNOSIS — D509 Iron deficiency anemia, unspecified: Secondary | ICD-10-CM | POA: Diagnosis not present

## 2019-12-14 NOTE — Telephone Encounter (Signed)
I called patient and left voice mail acknowledging message.

## 2019-12-15 ENCOUNTER — Other Ambulatory Visit: Payer: Self-pay

## 2019-12-15 ENCOUNTER — Ambulatory Visit (AMBULATORY_SURGERY_CENTER): Payer: Self-pay | Admitting: *Deleted

## 2019-12-15 VITALS — Ht 67.0 in | Wt 199.0 lb

## 2019-12-15 DIAGNOSIS — D631 Anemia in chronic kidney disease: Secondary | ICD-10-CM | POA: Diagnosis not present

## 2019-12-15 DIAGNOSIS — D509 Iron deficiency anemia, unspecified: Secondary | ICD-10-CM | POA: Diagnosis not present

## 2019-12-15 DIAGNOSIS — Z86010 Personal history of colonic polyps: Secondary | ICD-10-CM

## 2019-12-15 DIAGNOSIS — N2581 Secondary hyperparathyroidism of renal origin: Secondary | ICD-10-CM | POA: Diagnosis not present

## 2019-12-15 DIAGNOSIS — N186 End stage renal disease: Secondary | ICD-10-CM | POA: Diagnosis not present

## 2019-12-15 DIAGNOSIS — Z992 Dependence on renal dialysis: Secondary | ICD-10-CM | POA: Diagnosis not present

## 2019-12-15 MED ORDER — PEG 3350-KCL-NA BICARB-NACL 420 G PO SOLR: 4000.0000 mL | Freq: Once | ORAL | 0 refills | Status: AC

## 2019-12-15 NOTE — Progress Notes (Signed)
Fully vax'd ° °No egg or soy allergy known to patient  °No issues with past sedation with any surgeries or procedures °no intubation problems in the past  °No FH of Malignant Hyperthermia °No diet pills per patient °No home 02 use per patient  °No blood thinners per patient  °Pt denies issues with constipation  °No A fib or A flutter  °EMMI video to pt or via MyChart  °COVID 19 guidelines implemented in [facility] today with Pt and RN  ° ° °Due to the COVID-19 pandemic we are asking patients to follow these guidelines. Please only bring one care partner. Please be aware that your care partner may wait in the car in the parking lot or if they feel like they will be too hot to wait in the car, they may wait in the lobby on the 4th floor. All care partners are required to wear a mask the entire time (we do not have any that we can provide them), they need to practice social distancing, and we will do a Covid check for all patient's and care partners when you arrive. Also we will check their temperature and your temperature. If the care partner waits in their car they need to stay in the parking lot the entire time and we will call them on their cell phone when the patient is ready for discharge so they can bring the car to the front of the building. Also all patient's will need to wear a mask into building. ° °

## 2019-12-18 DIAGNOSIS — N186 End stage renal disease: Secondary | ICD-10-CM | POA: Diagnosis not present

## 2019-12-18 DIAGNOSIS — D631 Anemia in chronic kidney disease: Secondary | ICD-10-CM | POA: Diagnosis not present

## 2019-12-18 DIAGNOSIS — N2581 Secondary hyperparathyroidism of renal origin: Secondary | ICD-10-CM | POA: Diagnosis not present

## 2019-12-18 DIAGNOSIS — Z992 Dependence on renal dialysis: Secondary | ICD-10-CM | POA: Diagnosis not present

## 2019-12-18 DIAGNOSIS — D509 Iron deficiency anemia, unspecified: Secondary | ICD-10-CM | POA: Diagnosis not present

## 2019-12-21 DIAGNOSIS — D509 Iron deficiency anemia, unspecified: Secondary | ICD-10-CM | POA: Diagnosis not present

## 2019-12-21 DIAGNOSIS — D631 Anemia in chronic kidney disease: Secondary | ICD-10-CM | POA: Diagnosis not present

## 2019-12-21 DIAGNOSIS — Z992 Dependence on renal dialysis: Secondary | ICD-10-CM | POA: Diagnosis not present

## 2019-12-21 DIAGNOSIS — N2581 Secondary hyperparathyroidism of renal origin: Secondary | ICD-10-CM | POA: Diagnosis not present

## 2019-12-21 DIAGNOSIS — N186 End stage renal disease: Secondary | ICD-10-CM | POA: Diagnosis not present

## 2019-12-23 DIAGNOSIS — N186 End stage renal disease: Secondary | ICD-10-CM | POA: Diagnosis not present

## 2019-12-23 DIAGNOSIS — N2581 Secondary hyperparathyroidism of renal origin: Secondary | ICD-10-CM | POA: Diagnosis not present

## 2019-12-23 DIAGNOSIS — D509 Iron deficiency anemia, unspecified: Secondary | ICD-10-CM | POA: Diagnosis not present

## 2019-12-23 DIAGNOSIS — D631 Anemia in chronic kidney disease: Secondary | ICD-10-CM | POA: Diagnosis not present

## 2019-12-23 DIAGNOSIS — T8612 Kidney transplant failure: Secondary | ICD-10-CM | POA: Diagnosis not present

## 2019-12-23 DIAGNOSIS — Z992 Dependence on renal dialysis: Secondary | ICD-10-CM | POA: Diagnosis not present

## 2019-12-25 DIAGNOSIS — N2581 Secondary hyperparathyroidism of renal origin: Secondary | ICD-10-CM | POA: Diagnosis not present

## 2019-12-25 DIAGNOSIS — D509 Iron deficiency anemia, unspecified: Secondary | ICD-10-CM | POA: Diagnosis not present

## 2019-12-25 DIAGNOSIS — Z992 Dependence on renal dialysis: Secondary | ICD-10-CM | POA: Diagnosis not present

## 2019-12-25 DIAGNOSIS — N186 End stage renal disease: Secondary | ICD-10-CM | POA: Diagnosis not present

## 2019-12-25 DIAGNOSIS — D631 Anemia in chronic kidney disease: Secondary | ICD-10-CM | POA: Diagnosis not present

## 2019-12-28 DIAGNOSIS — N2581 Secondary hyperparathyroidism of renal origin: Secondary | ICD-10-CM | POA: Diagnosis not present

## 2019-12-28 DIAGNOSIS — Z992 Dependence on renal dialysis: Secondary | ICD-10-CM | POA: Diagnosis not present

## 2019-12-28 DIAGNOSIS — D509 Iron deficiency anemia, unspecified: Secondary | ICD-10-CM | POA: Diagnosis not present

## 2019-12-28 DIAGNOSIS — N186 End stage renal disease: Secondary | ICD-10-CM | POA: Diagnosis not present

## 2019-12-28 DIAGNOSIS — D631 Anemia in chronic kidney disease: Secondary | ICD-10-CM | POA: Diagnosis not present

## 2019-12-29 ENCOUNTER — Ambulatory Visit (AMBULATORY_SURGERY_CENTER): Payer: Medicare Other | Admitting: Gastroenterology

## 2019-12-29 ENCOUNTER — Encounter: Payer: Self-pay | Admitting: Gastroenterology

## 2019-12-29 ENCOUNTER — Other Ambulatory Visit: Payer: Self-pay

## 2019-12-29 VITALS — BP 164/64 | HR 64 | Temp 97.3°F | Resp 16 | Ht 67.0 in | Wt 199.0 lb

## 2019-12-29 DIAGNOSIS — D125 Benign neoplasm of sigmoid colon: Secondary | ICD-10-CM | POA: Diagnosis not present

## 2019-12-29 DIAGNOSIS — G4733 Obstructive sleep apnea (adult) (pediatric): Secondary | ICD-10-CM | POA: Diagnosis not present

## 2019-12-29 DIAGNOSIS — Z86010 Personal history of colonic polyps: Secondary | ICD-10-CM | POA: Diagnosis not present

## 2019-12-29 DIAGNOSIS — N189 Chronic kidney disease, unspecified: Secondary | ICD-10-CM | POA: Diagnosis not present

## 2019-12-29 MED ORDER — SODIUM CHLORIDE 0.9 % IV SOLN: 500.0000 mL | Freq: Once | INTRAVENOUS | Status: DC

## 2019-12-29 NOTE — Op Note (Signed)
[facility] Patient Name: Kirk [name] Procedure Date: 12/29/2019 11:32 AM MRN: [id number] Endoscopist: Milus Banister , MD Age: 59 Referring MD:  Date of Birth: [birth year] Gender: Male Account #: [id number] Procedure:                Colonoscopy Indications:              High risk colon cancer surveillance: Personal                            history of colonic polyps; colonoscopy May                            2009.Recommended to have repeat colonoscopy at 3                            year interval. A reminder letter was sent in 2012.                            Colonoscopy 06/2011 Dr. Ardis Hughs, two subcentimeter                            polyps removed, were adenomatous. Recommended                            recall at 5 year interval. Colonoscopy August                            2020found 3 polyps, one was 10 mm, all were                            adenomatous. Medicines:                Monitored Anesthesia Care Procedure:                Pre-Anesthesia Assessment:                           - Prior to the procedure, a History and Physical                            was performed, and patient medications and                            allergies were reviewed. The patient's tolerance of                            previous anesthesia was also reviewed. The risks                            and benefits of the procedure and the sedation                            options and risks were discussed with the patient.  All questions were answered, and informed consent                            was obtained. Prior Anticoagulants: The patient has                            taken no previous anticoagulant or antiplatelet                            agents. ASA Grade Assessment: III - A patient with                            severe systemic disease. After reviewing the risks                            and benefits, the patient was deemed in                             satisfactory condition to undergo the procedure.                           After obtaining informed consent, the colonoscope                            was passed under direct vision. Throughout the                            procedure, the patient's blood pressure, pulse, and                            oxygen saturations were monitored continuously. The                            Colonoscope was introduced through the anus and                            advanced to the the cecum, identified by                            appendiceal orifice and ileocecal valve. The                            colonoscopy was performed without difficulty. The                            patient tolerated the procedure well. The quality                            of the bowel preparation was good. The ileocecal                            valve, appendiceal orifice, and rectum were  photographed. Scope In: 11:39:02 AM Scope Out: 11:48:21 AM Scope Withdrawal Time: 0 hours 7 minutes 52 seconds  Total Procedure Duration: 0 hours 9 minutes 19 seconds  Findings:                 A 2 mm polyp was found in the sigmoid colon. The                            polyp was sessile. The polyp was removed with a                            cold snare. Resection and retrieval were complete.                           External and internal hemorrhoids were found. The                            hemorrhoids were small.                           The exam was otherwise without abnormality on                            direct and retroflexion views. Complications:            No immediate complications. Estimated blood loss:                            None. Estimated Blood Loss:     Estimated blood loss: none. Impression:               - One 2 mm polyp in the sigmoid colon, removed with                            a cold snare. Resected and retrieved.                           - External and  internal hemorrhoids.                           - The examination was otherwise normal on direct                            and retroflexion views. Recommendation:           - Patient has a contact number available for                            emergencies. The signs and symptoms of potential                            delayed complications were discussed with the                            patient. Return to normal activities tomorrow.  Written discharge instructions were provided to the                            patient.                           - Resume previous diet.                           - Continue present medications.                           - Await pathology results. Milus Banister, MD 12/29/2019 11:51:57 AM This report has been signed electronically.

## 2019-12-29 NOTE — Progress Notes (Signed)
Called to room to assist during endoscopic procedure.  Patient ID and intended procedure confirmed with present staff. Received instructions for my participation in the procedure from the performing physician.  

## 2019-12-29 NOTE — Progress Notes (Signed)
Pt's states no medical or surgical changes since previsit or office visit. 

## 2019-12-29 NOTE — Patient Instructions (Signed)
2 polyps removed- see handouts!!  Await pathology  Continue your normal medications   Please read over handout about hemorrhoids  YOU HAD AN ENDOSCOPIC PROCEDURE TODAY AT [facility]:   Refer to the procedure report that was given to you for any specific questions about what was found during the examination.  If the procedure report does not answer your questions, please call your gastroenterologist to clarify.  If you requested that your care partner not be given the details of your procedure findings, then the procedure report has been included in a sealed envelope for you to review at your convenience later.  YOU SHOULD EXPECT: Some feelings of bloating in the abdomen. Passage of more gas than usual.  Walking can help get rid of the air that was put into your GI tract during the procedure and reduce the bloating. If you had a lower endoscopy (such as a colonoscopy or flexible sigmoidoscopy) you may notice spotting of blood in your stool or on the toilet paper. If you underwent a bowel prep for your procedure, you may not have a normal bowel movement for a few days.  Please Note:  You might notice some irritation and congestion in your nose or some drainage.  This is from the oxygen used during your procedure.  There is no need for concern and it should clear up in a day or so.  SYMPTOMS TO REPORT IMMEDIATELY:   Following lower endoscopy (colonoscopy or flexible sigmoidoscopy):  Excessive amounts of blood in the stool  Significant tenderness or worsening of abdominal pains  Swelling of the abdomen that is new, acute  Fever of 100F or higher  For urgent or emergent issues, a gastroenterologist can be reached at any hour by calling [phone]. Do not use MyChart messaging for urgent concerns.    DIET:  We do recommend a small meal at first, but then you may proceed to your regular diet.  Drink plenty of fluids but you should avoid alcoholic beverages for 24  hours.  ACTIVITY:  You should plan to take it easy for the rest of today and you should NOT DRIVE or use heavy machinery until tomorrow (because of the sedation medicines used during the test).    FOLLOW UP: Our staff will call the number listed on your records 48-72 hours following your procedure to check on you and address any questions or concerns that you may have regarding the information given to you following your procedure. If we do not reach you, we will leave a message.  We will attempt to reach you two times.  During this call, we will ask if you have developed any symptoms of COVID 19. If you develop any symptoms (ie: fever, flu-like symptoms, shortness of breath, cough etc.) before then, please call [phone].  If you test positive for Covid 19 in the 2 weeks post procedure, please call and report this information to [place].    If any biopsies were taken you will be contacted by phone or by letter within the next 1-3 weeks.  Please call us at [phone] if you have not heard about the biopsies in 3 weeks.    SIGNATURES/CONFIDENTIALITY: You and/or your care partner have signed paperwork which will be entered into your electronic medical record.  These signatures attest to the fact that that the information above on your After Visit Summary has been reviewed and is understood.  Full responsibility of the confidentiality of this discharge information lies with you  and/or your care-partner.

## 2019-12-29 NOTE — Progress Notes (Signed)
Pt denies discomfort or SOB at D/C

## 2019-12-30 DIAGNOSIS — D631 Anemia in chronic kidney disease: Secondary | ICD-10-CM | POA: Diagnosis not present

## 2019-12-30 DIAGNOSIS — N2581 Secondary hyperparathyroidism of renal origin: Secondary | ICD-10-CM | POA: Diagnosis not present

## 2019-12-30 DIAGNOSIS — Z992 Dependence on renal dialysis: Secondary | ICD-10-CM | POA: Diagnosis not present

## 2019-12-30 DIAGNOSIS — N186 End stage renal disease: Secondary | ICD-10-CM | POA: Diagnosis not present

## 2019-12-30 DIAGNOSIS — D509 Iron deficiency anemia, unspecified: Secondary | ICD-10-CM | POA: Diagnosis not present

## 2019-12-31 ENCOUNTER — Telehealth: Payer: Self-pay | Admitting: *Deleted

## 2019-12-31 NOTE — Telephone Encounter (Signed)
  Follow up Call-  Call back number 12/29/2019  Post procedure Call Back phone  # [phone]  Permission to leave phone message Yes  Some recent data might be hidden     Patient questions:  Do you have a fever, pain , or abdominal swelling? No. Pain Score  0 *  Have you tolerated food without any problems? Yes.    Have you been able to return to your normal activities? Yes.    Do you have any questions about your discharge instructions: Diet   No. Medications  No. Follow up visit  No.  Do you have questions or concerns about your Care? No.  Actions: * If pain score is 4 or above: No action needed, pain <4  1. Have you developed a fever since your procedure? NO  2.   Have you had an respiratory symptoms (SOB or cough) since your procedure? NO  3.   Have you tested positive for COVID 19 since your procedure NO  4.   Have you had any family members/close contacts diagnosed with the COVID 19 since your procedure?  NO   If yes to any of these questions please route to Joylene John, RN and Joella Prince, RN

## 2020-01-01 DIAGNOSIS — N186 End stage renal disease: Secondary | ICD-10-CM | POA: Diagnosis not present

## 2020-01-01 DIAGNOSIS — D509 Iron deficiency anemia, unspecified: Secondary | ICD-10-CM | POA: Diagnosis not present

## 2020-01-01 DIAGNOSIS — Z992 Dependence on renal dialysis: Secondary | ICD-10-CM | POA: Diagnosis not present

## 2020-01-01 DIAGNOSIS — N2581 Secondary hyperparathyroidism of renal origin: Secondary | ICD-10-CM | POA: Diagnosis not present

## 2020-01-01 DIAGNOSIS — D631 Anemia in chronic kidney disease: Secondary | ICD-10-CM | POA: Diagnosis not present

## 2020-01-04 DIAGNOSIS — Z992 Dependence on renal dialysis: Secondary | ICD-10-CM | POA: Diagnosis not present

## 2020-01-04 DIAGNOSIS — N186 End stage renal disease: Secondary | ICD-10-CM | POA: Diagnosis not present

## 2020-01-04 DIAGNOSIS — D631 Anemia in chronic kidney disease: Secondary | ICD-10-CM | POA: Diagnosis not present

## 2020-01-04 DIAGNOSIS — N2581 Secondary hyperparathyroidism of renal origin: Secondary | ICD-10-CM | POA: Diagnosis not present

## 2020-01-04 DIAGNOSIS — D509 Iron deficiency anemia, unspecified: Secondary | ICD-10-CM | POA: Diagnosis not present

## 2020-01-05 ENCOUNTER — Encounter: Payer: Self-pay | Admitting: Gastroenterology

## 2020-01-06 DIAGNOSIS — D509 Iron deficiency anemia, unspecified: Secondary | ICD-10-CM | POA: Diagnosis not present

## 2020-01-06 DIAGNOSIS — D631 Anemia in chronic kidney disease: Secondary | ICD-10-CM | POA: Diagnosis not present

## 2020-01-06 DIAGNOSIS — N2581 Secondary hyperparathyroidism of renal origin: Secondary | ICD-10-CM | POA: Diagnosis not present

## 2020-01-06 DIAGNOSIS — N186 End stage renal disease: Secondary | ICD-10-CM | POA: Diagnosis not present

## 2020-01-06 DIAGNOSIS — Z992 Dependence on renal dialysis: Secondary | ICD-10-CM | POA: Diagnosis not present

## 2020-01-08 DIAGNOSIS — Z992 Dependence on renal dialysis: Secondary | ICD-10-CM | POA: Diagnosis not present

## 2020-01-08 DIAGNOSIS — D509 Iron deficiency anemia, unspecified: Secondary | ICD-10-CM | POA: Diagnosis not present

## 2020-01-08 DIAGNOSIS — N2581 Secondary hyperparathyroidism of renal origin: Secondary | ICD-10-CM | POA: Diagnosis not present

## 2020-01-08 DIAGNOSIS — N186 End stage renal disease: Secondary | ICD-10-CM | POA: Diagnosis not present

## 2020-01-08 DIAGNOSIS — D631 Anemia in chronic kidney disease: Secondary | ICD-10-CM | POA: Diagnosis not present

## 2020-01-11 DIAGNOSIS — D631 Anemia in chronic kidney disease: Secondary | ICD-10-CM | POA: Diagnosis not present

## 2020-01-11 DIAGNOSIS — N186 End stage renal disease: Secondary | ICD-10-CM | POA: Diagnosis not present

## 2020-01-11 DIAGNOSIS — Z992 Dependence on renal dialysis: Secondary | ICD-10-CM | POA: Diagnosis not present

## 2020-01-11 DIAGNOSIS — D509 Iron deficiency anemia, unspecified: Secondary | ICD-10-CM | POA: Diagnosis not present

## 2020-01-11 DIAGNOSIS — N2581 Secondary hyperparathyroidism of renal origin: Secondary | ICD-10-CM | POA: Diagnosis not present

## 2020-01-13 DIAGNOSIS — N2581 Secondary hyperparathyroidism of renal origin: Secondary | ICD-10-CM | POA: Diagnosis not present

## 2020-01-13 DIAGNOSIS — D631 Anemia in chronic kidney disease: Secondary | ICD-10-CM | POA: Diagnosis not present

## 2020-01-13 DIAGNOSIS — N186 End stage renal disease: Secondary | ICD-10-CM | POA: Diagnosis not present

## 2020-01-13 DIAGNOSIS — D509 Iron deficiency anemia, unspecified: Secondary | ICD-10-CM | POA: Diagnosis not present

## 2020-01-13 DIAGNOSIS — Z992 Dependence on renal dialysis: Secondary | ICD-10-CM | POA: Diagnosis not present

## 2020-01-15 DIAGNOSIS — D631 Anemia in chronic kidney disease: Secondary | ICD-10-CM | POA: Diagnosis not present

## 2020-01-15 DIAGNOSIS — N2581 Secondary hyperparathyroidism of renal origin: Secondary | ICD-10-CM | POA: Diagnosis not present

## 2020-01-15 DIAGNOSIS — N186 End stage renal disease: Secondary | ICD-10-CM | POA: Diagnosis not present

## 2020-01-15 DIAGNOSIS — Z992 Dependence on renal dialysis: Secondary | ICD-10-CM | POA: Diagnosis not present

## 2020-01-15 DIAGNOSIS — D509 Iron deficiency anemia, unspecified: Secondary | ICD-10-CM | POA: Diagnosis not present

## 2020-01-18 DIAGNOSIS — Z992 Dependence on renal dialysis: Secondary | ICD-10-CM | POA: Diagnosis not present

## 2020-01-18 DIAGNOSIS — N2581 Secondary hyperparathyroidism of renal origin: Secondary | ICD-10-CM | POA: Diagnosis not present

## 2020-01-18 DIAGNOSIS — D631 Anemia in chronic kidney disease: Secondary | ICD-10-CM | POA: Diagnosis not present

## 2020-01-18 DIAGNOSIS — D509 Iron deficiency anemia, unspecified: Secondary | ICD-10-CM | POA: Diagnosis not present

## 2020-01-18 DIAGNOSIS — N186 End stage renal disease: Secondary | ICD-10-CM | POA: Diagnosis not present

## 2020-01-20 DIAGNOSIS — Z992 Dependence on renal dialysis: Secondary | ICD-10-CM | POA: Diagnosis not present

## 2020-01-20 DIAGNOSIS — D631 Anemia in chronic kidney disease: Secondary | ICD-10-CM | POA: Diagnosis not present

## 2020-01-20 DIAGNOSIS — D509 Iron deficiency anemia, unspecified: Secondary | ICD-10-CM | POA: Diagnosis not present

## 2020-01-20 DIAGNOSIS — N186 End stage renal disease: Secondary | ICD-10-CM | POA: Diagnosis not present

## 2020-01-20 DIAGNOSIS — N2581 Secondary hyperparathyroidism of renal origin: Secondary | ICD-10-CM | POA: Diagnosis not present

## 2020-01-22 DIAGNOSIS — Z992 Dependence on renal dialysis: Secondary | ICD-10-CM | POA: Diagnosis not present

## 2020-01-22 DIAGNOSIS — D631 Anemia in chronic kidney disease: Secondary | ICD-10-CM | POA: Diagnosis not present

## 2020-01-22 DIAGNOSIS — N2581 Secondary hyperparathyroidism of renal origin: Secondary | ICD-10-CM | POA: Diagnosis not present

## 2020-01-22 DIAGNOSIS — D509 Iron deficiency anemia, unspecified: Secondary | ICD-10-CM | POA: Diagnosis not present

## 2020-01-22 DIAGNOSIS — N186 End stage renal disease: Secondary | ICD-10-CM | POA: Diagnosis not present

## 2020-01-23 DIAGNOSIS — N186 End stage renal disease: Secondary | ICD-10-CM | POA: Diagnosis not present

## 2020-01-23 DIAGNOSIS — Z992 Dependence on renal dialysis: Secondary | ICD-10-CM | POA: Diagnosis not present

## 2020-01-23 DIAGNOSIS — T8612 Kidney transplant failure: Secondary | ICD-10-CM | POA: Diagnosis not present

## 2020-01-25 DIAGNOSIS — Z992 Dependence on renal dialysis: Secondary | ICD-10-CM | POA: Diagnosis not present

## 2020-01-25 DIAGNOSIS — D631 Anemia in chronic kidney disease: Secondary | ICD-10-CM | POA: Diagnosis not present

## 2020-01-25 DIAGNOSIS — D509 Iron deficiency anemia, unspecified: Secondary | ICD-10-CM | POA: Diagnosis not present

## 2020-01-25 DIAGNOSIS — N186 End stage renal disease: Secondary | ICD-10-CM | POA: Diagnosis not present

## 2020-01-25 DIAGNOSIS — N2581 Secondary hyperparathyroidism of renal origin: Secondary | ICD-10-CM | POA: Diagnosis not present

## 2020-01-27 DIAGNOSIS — Z992 Dependence on renal dialysis: Secondary | ICD-10-CM | POA: Diagnosis not present

## 2020-01-27 DIAGNOSIS — D509 Iron deficiency anemia, unspecified: Secondary | ICD-10-CM | POA: Diagnosis not present

## 2020-01-27 DIAGNOSIS — D631 Anemia in chronic kidney disease: Secondary | ICD-10-CM | POA: Diagnosis not present

## 2020-01-27 DIAGNOSIS — N186 End stage renal disease: Secondary | ICD-10-CM | POA: Diagnosis not present

## 2020-01-27 DIAGNOSIS — N2581 Secondary hyperparathyroidism of renal origin: Secondary | ICD-10-CM | POA: Diagnosis not present

## 2020-01-29 DIAGNOSIS — D509 Iron deficiency anemia, unspecified: Secondary | ICD-10-CM | POA: Diagnosis not present

## 2020-01-29 DIAGNOSIS — N186 End stage renal disease: Secondary | ICD-10-CM | POA: Diagnosis not present

## 2020-01-29 DIAGNOSIS — Z992 Dependence on renal dialysis: Secondary | ICD-10-CM | POA: Diagnosis not present

## 2020-01-29 DIAGNOSIS — D631 Anemia in chronic kidney disease: Secondary | ICD-10-CM | POA: Diagnosis not present

## 2020-01-29 DIAGNOSIS — N2581 Secondary hyperparathyroidism of renal origin: Secondary | ICD-10-CM | POA: Diagnosis not present

## 2020-02-01 DIAGNOSIS — D509 Iron deficiency anemia, unspecified: Secondary | ICD-10-CM | POA: Diagnosis not present

## 2020-02-01 DIAGNOSIS — N2581 Secondary hyperparathyroidism of renal origin: Secondary | ICD-10-CM | POA: Diagnosis not present

## 2020-02-01 DIAGNOSIS — N186 End stage renal disease: Secondary | ICD-10-CM | POA: Diagnosis not present

## 2020-02-01 DIAGNOSIS — Z992 Dependence on renal dialysis: Secondary | ICD-10-CM | POA: Diagnosis not present

## 2020-02-01 DIAGNOSIS — D631 Anemia in chronic kidney disease: Secondary | ICD-10-CM | POA: Diagnosis not present

## 2020-02-03 DIAGNOSIS — Z992 Dependence on renal dialysis: Secondary | ICD-10-CM | POA: Diagnosis not present

## 2020-02-03 DIAGNOSIS — D631 Anemia in chronic kidney disease: Secondary | ICD-10-CM | POA: Diagnosis not present

## 2020-02-03 DIAGNOSIS — D509 Iron deficiency anemia, unspecified: Secondary | ICD-10-CM | POA: Diagnosis not present

## 2020-02-03 DIAGNOSIS — N2581 Secondary hyperparathyroidism of renal origin: Secondary | ICD-10-CM | POA: Diagnosis not present

## 2020-02-03 DIAGNOSIS — N186 End stage renal disease: Secondary | ICD-10-CM | POA: Diagnosis not present

## 2020-02-05 DIAGNOSIS — N186 End stage renal disease: Secondary | ICD-10-CM | POA: Diagnosis not present

## 2020-02-05 DIAGNOSIS — D509 Iron deficiency anemia, unspecified: Secondary | ICD-10-CM | POA: Diagnosis not present

## 2020-02-05 DIAGNOSIS — Z992 Dependence on renal dialysis: Secondary | ICD-10-CM | POA: Diagnosis not present

## 2020-02-05 DIAGNOSIS — D631 Anemia in chronic kidney disease: Secondary | ICD-10-CM | POA: Diagnosis not present

## 2020-02-05 DIAGNOSIS — N2581 Secondary hyperparathyroidism of renal origin: Secondary | ICD-10-CM | POA: Diagnosis not present

## 2020-02-06 ENCOUNTER — Other Ambulatory Visit: Payer: Self-pay | Admitting: Internal Medicine

## 2020-02-06 DIAGNOSIS — N186 End stage renal disease: Secondary | ICD-10-CM

## 2020-02-06 DIAGNOSIS — Z992 Dependence on renal dialysis: Secondary | ICD-10-CM

## 2020-02-08 DIAGNOSIS — N2581 Secondary hyperparathyroidism of renal origin: Secondary | ICD-10-CM | POA: Diagnosis not present

## 2020-02-08 DIAGNOSIS — Z992 Dependence on renal dialysis: Secondary | ICD-10-CM | POA: Diagnosis not present

## 2020-02-08 DIAGNOSIS — D509 Iron deficiency anemia, unspecified: Secondary | ICD-10-CM | POA: Diagnosis not present

## 2020-02-08 DIAGNOSIS — N186 End stage renal disease: Secondary | ICD-10-CM | POA: Diagnosis not present

## 2020-02-08 DIAGNOSIS — D631 Anemia in chronic kidney disease: Secondary | ICD-10-CM | POA: Diagnosis not present

## 2020-02-10 DIAGNOSIS — N2581 Secondary hyperparathyroidism of renal origin: Secondary | ICD-10-CM | POA: Diagnosis not present

## 2020-02-10 DIAGNOSIS — D631 Anemia in chronic kidney disease: Secondary | ICD-10-CM | POA: Diagnosis not present

## 2020-02-10 DIAGNOSIS — D509 Iron deficiency anemia, unspecified: Secondary | ICD-10-CM | POA: Diagnosis not present

## 2020-02-10 DIAGNOSIS — Z992 Dependence on renal dialysis: Secondary | ICD-10-CM | POA: Diagnosis not present

## 2020-02-10 DIAGNOSIS — N186 End stage renal disease: Secondary | ICD-10-CM | POA: Diagnosis not present

## 2020-02-12 DIAGNOSIS — Z992 Dependence on renal dialysis: Secondary | ICD-10-CM | POA: Diagnosis not present

## 2020-02-12 DIAGNOSIS — N2581 Secondary hyperparathyroidism of renal origin: Secondary | ICD-10-CM | POA: Diagnosis not present

## 2020-02-12 DIAGNOSIS — D509 Iron deficiency anemia, unspecified: Secondary | ICD-10-CM | POA: Diagnosis not present

## 2020-02-12 DIAGNOSIS — D631 Anemia in chronic kidney disease: Secondary | ICD-10-CM | POA: Diagnosis not present

## 2020-02-12 DIAGNOSIS — N186 End stage renal disease: Secondary | ICD-10-CM | POA: Diagnosis not present

## 2020-02-15 DIAGNOSIS — D509 Iron deficiency anemia, unspecified: Secondary | ICD-10-CM | POA: Diagnosis not present

## 2020-02-15 DIAGNOSIS — Z992 Dependence on renal dialysis: Secondary | ICD-10-CM | POA: Diagnosis not present

## 2020-02-15 DIAGNOSIS — N186 End stage renal disease: Secondary | ICD-10-CM | POA: Diagnosis not present

## 2020-02-15 DIAGNOSIS — D631 Anemia in chronic kidney disease: Secondary | ICD-10-CM | POA: Diagnosis not present

## 2020-02-15 DIAGNOSIS — N2581 Secondary hyperparathyroidism of renal origin: Secondary | ICD-10-CM | POA: Diagnosis not present

## 2020-02-17 DIAGNOSIS — D509 Iron deficiency anemia, unspecified: Secondary | ICD-10-CM | POA: Diagnosis not present

## 2020-02-17 DIAGNOSIS — N186 End stage renal disease: Secondary | ICD-10-CM | POA: Diagnosis not present

## 2020-02-17 DIAGNOSIS — Z992 Dependence on renal dialysis: Secondary | ICD-10-CM | POA: Diagnosis not present

## 2020-02-17 DIAGNOSIS — N2581 Secondary hyperparathyroidism of renal origin: Secondary | ICD-10-CM | POA: Diagnosis not present

## 2020-02-17 DIAGNOSIS — D631 Anemia in chronic kidney disease: Secondary | ICD-10-CM | POA: Diagnosis not present

## 2020-02-19 DIAGNOSIS — Z992 Dependence on renal dialysis: Secondary | ICD-10-CM | POA: Diagnosis not present

## 2020-02-19 DIAGNOSIS — N2581 Secondary hyperparathyroidism of renal origin: Secondary | ICD-10-CM | POA: Diagnosis not present

## 2020-02-19 DIAGNOSIS — N186 End stage renal disease: Secondary | ICD-10-CM | POA: Diagnosis not present

## 2020-02-19 DIAGNOSIS — D631 Anemia in chronic kidney disease: Secondary | ICD-10-CM | POA: Diagnosis not present

## 2020-02-19 DIAGNOSIS — D509 Iron deficiency anemia, unspecified: Secondary | ICD-10-CM | POA: Diagnosis not present

## 2020-02-22 DIAGNOSIS — N2581 Secondary hyperparathyroidism of renal origin: Secondary | ICD-10-CM | POA: Diagnosis not present

## 2020-02-22 DIAGNOSIS — D631 Anemia in chronic kidney disease: Secondary | ICD-10-CM | POA: Diagnosis not present

## 2020-02-22 DIAGNOSIS — Z992 Dependence on renal dialysis: Secondary | ICD-10-CM | POA: Diagnosis not present

## 2020-02-22 DIAGNOSIS — N186 End stage renal disease: Secondary | ICD-10-CM | POA: Diagnosis not present

## 2020-02-22 DIAGNOSIS — D509 Iron deficiency anemia, unspecified: Secondary | ICD-10-CM | POA: Diagnosis not present

## 2020-02-23 DIAGNOSIS — N186 End stage renal disease: Secondary | ICD-10-CM | POA: Diagnosis not present

## 2020-02-23 DIAGNOSIS — T8612 Kidney transplant failure: Secondary | ICD-10-CM | POA: Diagnosis not present

## 2020-02-23 DIAGNOSIS — Z992 Dependence on renal dialysis: Secondary | ICD-10-CM | POA: Diagnosis not present

## 2020-02-24 DIAGNOSIS — D631 Anemia in chronic kidney disease: Secondary | ICD-10-CM | POA: Diagnosis not present

## 2020-02-24 DIAGNOSIS — Z992 Dependence on renal dialysis: Secondary | ICD-10-CM | POA: Diagnosis not present

## 2020-02-24 DIAGNOSIS — N2581 Secondary hyperparathyroidism of renal origin: Secondary | ICD-10-CM | POA: Diagnosis not present

## 2020-02-24 DIAGNOSIS — D509 Iron deficiency anemia, unspecified: Secondary | ICD-10-CM | POA: Diagnosis not present

## 2020-02-24 DIAGNOSIS — N186 End stage renal disease: Secondary | ICD-10-CM | POA: Diagnosis not present

## 2020-02-25 ENCOUNTER — Other Ambulatory Visit: Payer: Self-pay

## 2020-02-25 ENCOUNTER — Ambulatory Visit (INDEPENDENT_AMBULATORY_CARE_PROVIDER_SITE_OTHER): Payer: Medicare Other | Admitting: Family

## 2020-02-25 ENCOUNTER — Ambulatory Visit: Payer: Medicare Other | Admitting: Internal Medicine

## 2020-02-25 ENCOUNTER — Encounter: Payer: Self-pay | Admitting: Family

## 2020-02-25 VITALS — BP 156/84 | HR 60 | Temp 97.3°F | Resp 16 | Ht 67.0 in | Wt 212.8 lb

## 2020-02-25 DIAGNOSIS — F419 Anxiety disorder, unspecified: Secondary | ICD-10-CM | POA: Diagnosis not present

## 2020-02-25 DIAGNOSIS — Z720 Tobacco use: Secondary | ICD-10-CM | POA: Diagnosis not present

## 2020-02-25 DIAGNOSIS — E782 Mixed hyperlipidemia: Secondary | ICD-10-CM

## 2020-02-25 DIAGNOSIS — I120 Hypertensive chronic kidney disease with stage 5 chronic kidney disease or end stage renal disease: Secondary | ICD-10-CM | POA: Diagnosis not present

## 2020-02-25 DIAGNOSIS — Z23 Encounter for immunization: Secondary | ICD-10-CM | POA: Diagnosis not present

## 2020-02-25 DIAGNOSIS — M170 Bilateral primary osteoarthritis of knee: Secondary | ICD-10-CM

## 2020-02-25 DIAGNOSIS — N186 End stage renal disease: Secondary | ICD-10-CM | POA: Diagnosis not present

## 2020-02-25 MED ORDER — TETANUS-DIPHTH-ACELL PERTUSSIS 5-2-15.5 LF-MCG/0.5 IM SUSP: 0.5000 mL | Freq: Once | INTRAMUSCULAR | 0 refills | Status: AC

## 2020-02-25 MED ORDER — BUPROPION HCL ER (XL) 150 MG PO TB24: 150.0000 mg | ORAL_TABLET | Freq: Every day | ORAL | 0 refills | Status: DC

## 2020-02-25 MED ORDER — TRAMADOL HCL 50 MG PO TABS: 50.0000 mg | ORAL_TABLET | Freq: Every day | ORAL | 0 refills | Status: DC | PRN

## 2020-02-25 MED ORDER — CLONAZEPAM 0.5 MG PO TABS: 0.5000 mg | ORAL_TABLET | Freq: Two times a day (BID) | ORAL | 0 refills | Status: DC | PRN

## 2020-02-25 NOTE — Patient Instructions (Signed)
- Lab work done today will call you with results.  Tobacco Use Disorder Tobacco use disorder (TUD) occurs when a person craves, seeks, and uses tobacco, regardless of the consequences. This disorder can cause problems with mental and physical health. It can affect your ability to have healthy relationships, and it can keep you from meeting your responsibilities at work, home, or school. Tobacco may be:  Smoked as a cigarette or cigar.  Inhaled using e-cigarettes.  Smoked in a pipe or hookah.  Chewed as smokeless tobacco.  Inhaled into the nostrils as snuff. Tobacco products contain a dangerous chemical called nicotine, which is very addictive. Nicotine triggers hormones that make the body feel stimulated and works on areas of the brain that make you feel good. These effects can make it hard for people to quit nicotine. Tobacco contains many other unsafe chemicals that can damage almost every organ in the body. Smoking tobacco also puts others in danger due to fire risk and possible health problems caused by breathing in secondhand smoke. What are the signs or symptoms? Symptoms of TUD may include:  Being unable to slow down or stop your tobacco use.  Spending an abnormal amount of time getting or using tobacco.  Craving tobacco. Cravings may last for up to 6 months after quitting.  Tobacco use that: ? Interferes with your work, school, or home life. ? Interferes with your personal and social relationships. ? Makes you give up activities that you once enjoyed or found important.  Using tobacco even though you know that it is: ? Dangerous or bad for your health or someone else's health. ? Causing problems in your life.  Needing more and more of the substance to get the same effect (developing tolerance).  Experiencing unpleasant symptoms if you do not use the substance (withdrawal). Withdrawal symptoms may include: ? Depressed, anxious, or irritable mood. ? Difficulty  concentrating. ? Increased appetite. ? Restlessness or trouble sleeping.  Using the substance to avoid withdrawal. How is this diagnosed? This condition may be diagnosed based on:  Your current and past tobacco use. Your health care provider may ask questions about how your tobacco use affects your life.  A physical exam. You may be diagnosed with TUD if you have at least two symptoms within a 60-month period. How is this treated? This condition is treated by stopping tobacco use. Many people are unable to quit on their own and need help. Treatment may include:  Nicotine replacement therapy (NRT). NRT provides nicotine without the other harmful chemicals in tobacco. NRT gradually lowers the dosage of nicotine in the body and reduces withdrawal symptoms. NRT is available as: ? Over-the-counter gums, lozenges, and skin patches. ? Prescription mouth inhalers and nasal sprays.  Medicine that acts on the brain to reduce cravings and withdrawal symptoms.  A type of talk therapy that examines your triggers for tobacco use, how to avoid them, and how to cope with cravings (behavioral therapy).  Hypnosis. This may help with withdrawal symptoms.  Joining a support group for others coping with TUD. The best treatment for TUD is usually a combination of medicine, talk therapy, and support groups. Recovery can be a long process. Many people start using tobacco again after stopping (relapse). If you relapse, it does not mean that treatment will not work. Follow these instructions at home: Lifestyle  Do not use any products that contain nicotine or tobacco, such as cigarettes and e-cigarettes.  Avoid things that trigger tobacco use as much as you  can. Triggers include people and situations that usually cause you to use tobacco.  Avoid drinks that contain caffeine, including coffee. These may worsen some withdrawal symptoms.  Find ways to manage stress. Wanting to smoke may cause stress, and  stress can make you want to smoke. Relaxation techniques such as deep breathing, meditation, and yoga may help.  Attend support groups as needed. These groups are an important part of long-term recovery for many people. General instructions  Take over-the-counter and prescription medicines only as told by your health care provider.  Check with your health care provider before taking any new prescription or over-the-counter medicines.  Decide on a friend, family member, or smoking quit-line (such as 1-800-QUIT-NOW in the [place] that you can call or text when you feel the urge to smoke or when you need help coping with cravings.  Keep all follow-up visits as told by your health care provider and therapist. This is important.   Contact a health care provider if:  You are not able to take your medicines as prescribed.  Your symptoms get worse, even with treatment. Summary  Tobacco use disorder (TUD) occurs when a person craves, seeks, and uses tobacco regardless of the consequences.  This condition may be diagnosed based on your current and past tobacco use and a physical exam.  Many people are unable to quit on their own and need help. Recovery can be a long process.  The most effective treatment for TUD is usually a combination of medicine, talk therapy, and support groups. This information is not intended to replace advice given to you by your health care provider. Make sure you discuss any questions you have with your health care provider. Document Revised: 12/26/2016 Document Reviewed: 12/26/2016 Elsevier Patient Education  2021 [employer]. Bupropion tablets (Depression/Mood Disorders) What is this medicine? BUPROPION (byoo PROE pee on) is used to treat depression. This medicine may be used for other purposes; ask your health care provider or pharmacist if you have questions. COMMON BRAND NAME(S): Wellbutrin What should I tell my health care provider before I take this  medicine? They need to know if you have any of these conditions:  an eating disorder, such as anorexia or bulimia  bipolar disorder or psychosis  diabetes or high blood sugar, treated with medication  glaucoma  heart disease, previous heart attack, or irregular heart beat  head injury or brain tumor  high blood pressure  kidney or liver disease  seizures  suicidal thoughts or a previous suicide attempt  Tourette's syndrome  weight loss  an unusual or allergic reaction to bupropion, other medicines, foods, dyes, or preservatives  breast-feeding  pregnant or trying to become pregnant How should I use this medicine? Take this medicine by mouth with a glass of water. Follow the directions on the prescription label. You can take it with or without food. If it upsets your stomach, take it with food. Take your medicine at regular intervals. Do not take your medicine more often than directed. Do not stop taking this medicine suddenly except upon the advice of your doctor. Stopping this medicine too quickly may cause serious side effects or your condition may worsen. A special MedGuide will be given to you by the pharmacist with each prescription and refill. Be sure to read this information carefully each time. Talk to your pediatrician regarding the use of this medicine in children. Special care may be needed. Overdosage: If you think you have taken too much of this medicine contact a  poison control center or emergency room at once. NOTE: This medicine is only for you. Do not share this medicine with others. What if I miss a dose? If you miss a dose, take it as soon as you can. If it is less than four hours to your next dose, take only that dose and skip the missed dose. Do not take double or extra doses. What may interact with this medicine? Do not take this medicine with any of the following medications:  linezolid  MAOIs like Azilect, Carbex, Eldepryl, Marplan, Nardil, and  Parnate  methylene blue (injected into a vein)  other medicines that contain bupropion like Zyban This medicine may also interact with the following medications:  alcohol  certain medicines for anxiety or sleep  certain medicines for blood pressure like metoprolol, propranolol  certain medicines for depression or psychotic disturbances  certain medicines for HIV or AIDS like efavirenz, lopinavir, nelfinavir, ritonavir  certain medicines for irregular heart beat like propafenone, flecainide  certain medicines for Parkinson's disease like amantadine, levodopa  certain medicines for seizures like carbamazepine, phenytoin, phenobarbital  cimetidine  clopidogrel  cyclophosphamide  digoxin  furazolidone  isoniazid  nicotine  orphenadrine  procarbazine  steroid medicines like prednisone or cortisone  stimulant medicines for attention disorders, weight loss, or to stay awake  tamoxifen  theophylline  thiotepa  ticlopidine  tramadol  warfarin This list may not describe all possible interactions. Give your health care provider a list of all the medicines, herbs, non-prescription drugs, or dietary supplements you use. Also tell them if you smoke, drink alcohol, or use illegal drugs. Some items may interact with your medicine. What should I watch for while using this medicine? Tell your doctor if your symptoms do not get better or if they get worse. Visit your doctor or healthcare provider for regular checks on your progress. Because it may take several weeks to see the full effects of this medicine, it is important to continue your treatment as prescribed by your doctor. This medicine may cause serious skin reactions. They can happen weeks to months after starting the medicine. Contact your healthcare provider right away if you notice fevers or flu-like symptoms with a rash. The rash may be red or purple and then turn into blisters or peeling of the skin. Or, you might  notice a red rash with swelling of the face, lips or lymph nodes in your neck or under your arms. Patients and their families should watch out for new or worsening thoughts of suicide or depression. Also watch out for sudden changes in feelings such as feeling anxious, agitated, panicky, irritable, hostile, aggressive, impulsive, severely restless, overly excited and hyperactive, or not being able to sleep. If this happens, especially at the beginning of treatment or after a change in dose, call your healthcare provider. Avoid alcoholic drinks while taking this medicine. Drinking excessive alcoholic beverages, using sleeping or anxiety medicines, or quickly stopping the use of these agents while taking this medicine may increase your risk for a seizure. Do not drive or use heavy machinery until you know how this medicine affects you. This medicine can impair your ability to perform these tasks. Do not take this medicine close to bedtime. It may prevent you from sleeping. Your mouth may get dry. Chewing sugarless gum or sucking hard candy, and drinking plenty of water may help. Contact your doctor if the problem does not go away or is severe. What side effects may I notice from receiving this medicine? Side  effects that you should report to your doctor or health care professional as soon as possible:  allergic reactions like skin rash, itching or hives, swelling of the face, lips, or tongue  breathing problems  changes in vision  confusion  elevated mood, decreased need for sleep, racing thoughts, impulsive behavior  fast or irregular heartbeat  hallucinations, loss of contact with reality  increased blood pressure  rash, fever, and swollen lymph nodes  redness, blistering, peeling, or loosening of the skin, including inside the mouth  seizures  suicidal thoughts or other mood changes  unusually weak or tired  vomiting Side effects that usually do not require medical attention  (report to your doctor or health care professional if they continue or are bothersome):  constipation  headache  loss of appetite  nausea  tremors  weight loss This list may not describe all possible side effects. Call your doctor for medical advice about side effects. You may report side effects to FDA at 1-800-FDA-1088. Where should I keep my medicine? Keep out of the reach of children. Store at room temperature between 20 and 25 degrees C (68 and 77 degrees F), away from direct sunlight and moisture. Keep tightly closed. Throw away any unused medicine after the expiration date. NOTE: This sheet is a summary. It may not cover all possible information. If you have questions about this medicine, talk to your doctor, pharmacist, or health care provider.  2021 Elsevier/[employer] Standard (2018-04-03 14:02:47)

## 2020-02-25 NOTE — Progress Notes (Signed)
Provider: Arber Wiemers FNP-C   Gayland Curry, DO  Patient Care Team: Gayland Curry, DO as PCP - General (Geriatric Medicine) [name], MD as PCP - Cardiology (Cardiology) Jovita Kussmaul, MD as Consulting Physician (General Surgery) [name], MD as Consulting Physician (Pulmonary Disease) Estanislado Emms, MD (Inactive) as Consulting Physician (Nephrology) Elam Dutch, MD as Consulting Physician (Vascular Surgery) [name], MD as Consulting Physician (Cardiology) Milus Banister, MD as Attending Physician (Gastroenterology) Jamal Maes, MD as Consulting Physician (Nephrology)  Extended Emergency Contact Information Primary Emergency Contact: [facility] Address: [address]          [place], [place] [address] Johnnette Litter of [place] Phone: [phone] Mobile Phone: [phone] Relation: Spouse  Code Status:  Full Code  Goals of care: Advanced Directive information Advanced Directives 10/22/2019  Does Patient Have a Medical Advance Directive? No  Does patient want to make changes to medical advance directive? -  Would patient like information on creating a medical advance directive? No - Patient declined  Pre-existing out of facility DNR order (yellow form or pink MOST form) -     Chief Complaint  Patient presents with  . Medical Management of Chronic Issues    Medical Management of Chronic Issues. Discuss the need for Eye Exam, Foot Exam, Tetanus Vaccine    HPI:  Pt is a 60 y.o. male seen today for 4 months follow up for medical management of chronic diseases.I'm seeing him for the first time today usually follows up with PCP Dr.Reed Has had his flu shot at dialysis.Has a medical history of Hypertension,Hyperlipidemia,Generalized anxiety disorder,Osteoarthritis,ESRD on hemodialysis on Monday -Wednesday and Friday,tobacco use disorder among other conditions.On chart,review history of Type 2 DM noted but states has no diabetes.Hgb A1C  5.0 ( 10/22/2019) previous 6.0 ( 06/29/2015) and 6.5 (02/09/2015).  States has upcoming surgical procedure for infected broken teeth extraction 03/04/2020. Also states on waiting list for Kidney Transplant.   Right shoulder - states surgery recommended but states not now.   Smoking Cigarette - smokes 3-4 per days.smoking cessation advised this visit would like to try medication.does not want nicotine patch.   Insomnia - states melatonin gummies has been effective.    Past Medical History:  Diagnosis Date  . Acute edema of lung, unspecified   . Acute, but ill-defined, cerebrovascular disease   . Allergy   . Anemia   . 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)   . Anxiety   . Asthma   . Asthma    moderate persistent  . Carpal tunnel syndrome   . Cellulitis and abscess of trunk   . Cholelithiasis 07/13/2014  . Chronic headaches   . Debility, unspecified   . Dermatophytosis of the body   . Dysrhythmia    history of  . Edema   . End stage renal disease on dialysis [facility])    "MWF; [facility]" (10/21/2014)  . Essential hypertension, benign   . GERD (gastroesophageal reflux disease)   . Gout, unspecified   . HTN (hypertension)   . Hypertrophy of prostate without urinary obstruction and other lower urinary tract symptoms (LUTS)   . Hypotension, unspecified   . Impotence of organic origin   . Insomnia, unspecified   . Kidney replaced by transplant   . Localization-related (focal) (partial) epilepsy and epileptic syndromes with complex partial seizures, without mention of intractable epilepsy    12-15-19- Wife states he has NEVER had a seizure   . Lumbago   .  Memory loss   . OSA on CPAP   . Other and unspecified hyperlipidemia    controlled /managed per wife   . Other chronic nonalcoholic liver disease   . Other malaise and fatigue   . Other nonspecific abnormal serum enzyme levels   . Pain in joint, lower leg   . Pain in joint, upper arm   . Pneumonia "several  times"  . Renal dialysis status(V45.11) 02/05/2010   restarted 01/02/13 ofter renal trransplant failure  . Secondary hyperparathyroidism (of renal origin)   . Shortness of breath   . Sleep apnea    wears cpap   . Tension headache   . Unspecified constipation   . Unspecified essential hypertension   . Unspecified hereditary and idiopathic peripheral neuropathy   . Unspecified vitamin D deficiency    Past Surgical History:  Procedure Laterality Date  . AV FISTULA PLACEMENT Left ?2010   "forearm; at [facility]"  . BACK SURGERY    . CARDIAC CATHETERIZATION  03/21/2011  . CHOLECYSTECTOMY N/A 10/21/2014   Procedure: LAPAROSCOPIC CHOLECYSTECTOMY WITH INTRAOPERATIVE CHOLANGIOGRAM;  Surgeon: Autumn Messing III, MD;  Location: [facility];  Service: General;  Laterality: N/A;  . COLONOSCOPY    . 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 KIDNEY TRANSPLANT  08/17/2011   [place]  10/21/2014   w/IOC  . LEFT HEART CATHETERIZATION WITH CORONARY ANGIOGRAM N/A 03/21/2011   Procedure: LEFT HEART CATHETERIZATION WITH CORONARY ANGIOGRAM;  Surgeon: [name], MD;  Location: [facility] CATH LAB;  Service: Cardiovascular;  Laterality: N/A;  . NEPHRECTOMY  08/2013   removed transplaned kidney  . POLYPECTOMY    . POSTERIOR FUSION CERVICAL SPINE  06/25/2012   for spinal stenosis  . VASECTOMY  2010    Allergies  Allergen Reactions  . Codeine Nausea And Vomiting    Allergies as of 02/25/2020      Reactions   Codeine Nausea And Vomiting      Medication List       Accurate as of February 25, 2020  4:01 PM. If you have any questions, ask your nurse or doctor.        acetaminophen 500 MG tablet Commonly known as: TYLENOL Take 1 tablet (500 mg total) by mouth every 6 (six) hours as needed for moderate pain.   albuterol 108 (90 Base) MCG/ACT inhaler Commonly known as: VENTOLIN HFA Inhale 2 puffs into the lungs every 6 (six) hours as needed for wheezing  or shortness of breath.   amLODipine 10 MG tablet Commonly known as: NORVASC TAKE 1 TABLET BY MOUTH EVERY DAY   aspirin EC 81 MG tablet Take 81 mg by mouth daily.   b complex-vitamin c-folic acid 0.8 MG Tabs tablet TAKE 1 TABLET BY MOUTH EVERY DAY   budesonide-formoterol 160-4.5 MCG/ACT inhaler Commonly known as: Symbicort Inhale 2 puffs into the lungs 2 (two) times daily.   buPROPion 150 MG 24 hr tablet Commonly known as: Wellbutrin XL Take 1 tablet (150 mg total) by mouth daily. Started by: Sandrea Hughs, NP   calcitRIOL 0.5 MCG capsule Commonly known as: ROCALTROL Take 2 capsules (1 mcg total) by mouth every Monday, Wednesday, and Friday with hemodialysis.   cinacalcet 90 MG tablet Commonly known as: SENSIPAR Take 90 mg by mouth every evening.   clonazePAM 0.5 MG tablet Commonly known as: KLONOPIN Take 1 tablet (0.5 mg total) by mouth 2 (two) times daily as needed for anxiety.  cloNIDine 0.1 MG tablet Commonly known as: CATAPRES Take 0.1 mg by mouth at bedtime.   diphenhydrAMINE 25 MG tablet Commonly known as: BENADRYL Take 50 mg by mouth at bedtime.   doxazosin 2 MG tablet Commonly known as: CARDURA Take 2 mg by mouth daily.   famotidine 20 MG tablet Commonly known as: Pepcid Take 1 tablet (20 mg total) by mouth at bedtime.   guaiFENesin 600 MG 12 hr tablet Commonly known as: MUCINEX Take 1,200 mg by mouth 2 (two) times daily.   hydrALAZINE 100 MG tablet Commonly known as: APRESOLINE Take 1 tablet (100 mg total) by mouth in the morning and at bedtime.   ipratropium-albuterol 0.5-2.5 (3) MG/3ML Soln Commonly known as: DUONEB Take 3 mLs by nebulization every 4 (four) hours as needed (Shortness of breath).   isosorbide mononitrate 30 MG 24 hr tablet Commonly known as: IMDUR TAKE 1 TABLET DAILY   lidocaine-prilocaine cream Commonly known as: EMLA Apply 1 application topically See admin instructions. Apply 1 to 2 hours prior to dialysis on Mondays,  Wednesdays, and Fridays. Cover with saran wrap.   metoprolol tartrate 100 MG tablet Commonly known as: LOPRESSOR TAKE 1 TABLET BY MOUTH TWICE A DAY   omeprazole 20 MG capsule Commonly known as: PRILOSEC TAKE 1 CAPSULE TWICE DAILY BEFORE A MEAL   ondansetron 4 MG disintegrating tablet Commonly known as: Zofran ODT Take 1 tablet (4 mg total) by mouth every 8 (eight) hours as needed for nausea or vomiting.   pravastatin 40 MG tablet Commonly known as: PRAVACHOL TAKE 1 TABLET BY MOUTH EVERY DAY IN THE EVENING   PRESCRIPTION MEDICATION See admin instructions. CPAP- At bedtime and during any time of rest   Renvela 800 MG tablet Generic drug: sevelamer carbonate Take 800-2,400 mg by mouth See admin instructions. Take 2,400 mg by mouth three times a day with meals and 800 mg with each snack   Tdap 05-23-13.5 LF-MCG/0.5 injection Commonly known as: ADACEL Inject 0.5 mLs into the muscle once for 1 dose.   traMADol 50 MG tablet Commonly known as: ULTRAM Take 1 tablet (50 mg total) by mouth daily as needed.   Vitamin D3 125 MCG (5000 UT) Tabs Take 5,000 Units by mouth daily.       Review of Systems  Constitutional: Negative for appetite change, chills, fatigue and fever.  HENT: Positive for hearing loss and tinnitus. Negative for congestion, rhinorrhea, sinus pressure, sinus pain, sneezing, sore throat and trouble swallowing.   Eyes: Negative for discharge, redness and itching.  Respiratory: Negative for cough, chest tightness, shortness of breath and wheezing.   Cardiovascular: Negative for chest pain, palpitations and leg swelling.  Endocrine: Negative for cold intolerance, heat intolerance, polydipsia, polyphagia and polyuria.  Genitourinary:       On Hemodialysis M-W-F  Musculoskeletal: Negative for arthralgias, gait problem and myalgias.  Skin: Negative for color change, pallor and rash.  Neurological: Negative for dizziness, speech difficulty, weakness, light-headedness,  numbness and headaches.  Hematological: Does not bruise/bleed easily.  Psychiatric/Behavioral: Positive for sleep disturbance. Negative for agitation, behavioral problems and confusion. The patient is nervous/anxious.        Melatonin gummies effective for sleep     Immunization History  Administered Date(s) Administered  . Influenza Whole 11/01/2010  . Influenza, Seasonal, Injecte, Preservative Fre 10/29/2012  . Influenza-Unspecified 11/21/2011, 11/05/2012, 09/22/2013, 09/22/2017, 11/23/2019  . Moderna Sars-Covid-2 Vaccination 04/06/2019, 05/07/2019  . PPD Test 12/26/2012  . Pneumococcal Polysaccharide-23 11/01/2010, 10/29/2012  . Pneumococcal-Unspecified 11/05/2012  . Tdap  01/22/2006   Pertinent  Health Maintenance Due  Topic Date Due  . HEMOGLOBIN A1C  04/20/2020  . COLONOSCOPY (Pts 45-28yrs Insurance coverage will need to be confirmed)  12/29/2026  . INFLUENZA VACCINE  Completed  . FOOT EXAM  Discontinued  . OPHTHALMOLOGY EXAM  Discontinued   Fall Risk  10/22/2019 08/04/2019 06/18/2019 02/05/2019 09/25/2018  Falls in the past year? 0 0 0 - 1  Number falls in past yr: - 0 0 1 1  Injury with Fall? 0 0 0 1 0  Comment - - - legs scratch -   Functional Status Survey:    Vitals:   02/25/20 1311  BP: (!) 156/84  Pulse: 60  Resp: 16  Temp: (!) 97.3 F (36.3 C)  TempSrc: Oral  SpO2: 98%  Weight: 212 lb 12.8 oz (96.5 kg)  Height: 5\' 7"  (1.702 m)   Body mass index is 33.33 kg/m. Physical Exam Vitals reviewed.  Constitutional:      General: He is not in acute distress.    Appearance: He is obese. He is not ill-appearing.  HENT:     Head: Normocephalic.     Right Ear: Tympanic membrane, ear canal and external ear normal. There is no impacted cerumen.     Left Ear: External ear normal. There is no impacted cerumen. Tympanic membrane is perforated.     Nose: Nose normal. No congestion or rhinorrhea.     Mouth/Throat:     Mouth: Mucous membranes are moist.     Dentition:  Abnormal dentition. Dental caries present. No dental abscesses.     Pharynx: Oropharynx is clear. No oropharyngeal exudate or posterior oropharyngeal erythema.     Comments: Broke teeth Eyes:     General: No scleral icterus.       Right eye: No discharge.        Left eye: No discharge.     Extraocular Movements: Extraocular movements intact.     Conjunctiva/sclera: Conjunctivae normal.     Pupils: Pupils are equal, round, and reactive to light.  Neck:     Vascular: No carotid bruit.  Cardiovascular:     Rate and Rhythm: Normal rate and regular rhythm.     Pulses: Normal pulses.     Heart sounds: Normal heart sounds. No murmur heard. No friction rub. No gallop.      Comments: AV fistula on left forearm positive for bruit and thrill  Pulmonary:     Effort: Pulmonary effort is normal. No respiratory distress.     Breath sounds: Normal breath sounds. No wheezing, rhonchi or rales.  Abdominal:     General: Bowel sounds are normal. There is no distension.     Palpations: Abdomen is soft. There is no mass.     Tenderness: There is no abdominal tenderness. There is no right CVA tenderness, left CVA tenderness, guarding or rebound.  Musculoskeletal:        General: No swelling or tenderness.     Right shoulder: No swelling, effusion or crepitus. Decreased range of motion. Normal strength. Normal pulse.     Left shoulder: Normal.     Cervical back: Normal range of motion. No rigidity or tenderness.     Right lower leg: No edema.     Left lower leg: No edema.  Lymphadenopathy:     Cervical: No cervical adenopathy.  Skin:    General: Skin is warm and dry.     Coloration: Skin is not pale.     Findings: No  bruising, erythema or rash.  Neurological:     Mental Status: He is alert and oriented to person, place, and time.     Cranial Nerves: No cranial nerve deficit.     Sensory: No sensory deficit.     Motor: No weakness.     Coordination: Coordination normal.     Gait: Gait normal.      Comments: HOH   Psychiatric:        Mood and Affect: Mood normal.        Behavior: Behavior normal.        Thought Content: Thought content normal.        Judgment: Judgment normal.     Labs reviewed: Recent Labs    07/15/19 0704  NA 139  K 5.0  CL 101  CO2 23  GLUCOSE 109*  BUN 45*  CREATININE 13.39*  CALCIUM 9.0   Recent Labs    07/15/19 0704  AST 15  ALT 15  ALKPHOS 57  BILITOT 1.1  PROT 6.7  ALBUMIN 3.6   Recent Labs    07/15/19 0704 07/15/19 1431 10/22/19 1602  WBC 5.3  --  4.3  NEUTROABS  --   --  2,829  HGB 10.4* 10.4* 10.9*  HCT 33.2* 33.0* 32.8*  MCV 101.5*  --  97.6  PLT 99*  --  74*   Lab Results  Component Value Date   TSH 0.787 01/28/2018   Lab Results  Component Value Date   HGBA1C 5.0 10/22/2019   Lab Results  Component Value Date   CHOL 90 10/22/2019   HDL 31 (L) 10/22/2019   LDLCALC 44 10/22/2019   TRIG 74 10/22/2019   CHOLHDL 2.9 10/22/2019    Significant Diagnostic Results in last 30 days:  No results found.  Assessment/Plan 1. Anxiety Continue on clonazepam. Narcotic uses contract signed. - clonazePAM (KLONOPIN) 0.5 MG tablet; Take 1 tablet (0.5 mg total) by mouth 2 (two) times daily as needed for anxiety.  Dispense: 60 tablet; Refill: 0  2. Bilateral primary osteoarthritis of knee Chronic - continue on Tramadol  Narcotic uses contract signed.No Urine drug test done today on HD  - traMADol (ULTRAM) 50 MG tablet; Take 1 tablet (50 mg total) by mouth daily as needed.  Dispense: 30 tablet; Refill: 0  3. Mixed hyperlipidemia Latest LDL at goal  - continue on pravastatin. dietary and lifestyle modification  - Lipid Panel  4. Benign hypertension with ESRD (end-stage renal disease) (HCC) B/p well controlled  - continue on Metoprolol tartrate,clonidine,Hydralazine,doxazosin,amlodpine and Imdur.  - CBC with Differential/Platelet - TSH  5. Tobacco abuse disorder Smoking cessation advised.would like to try bupropion and  not nicotine patch.  - buPROPion (WELLBUTRIN XL) 150 MG 24 hr tablet; Take 1 tablet (150 mg total) by mouth daily.  Dispense: 30 tablet; Refill: 0 - will follow up in 1 month to re-evaluate   6. Need for Tdap vaccination Advised to get Tdap vaccine administered at the pharmacy.script send to pharmacy today.  - Tdap (ADACEL) 05-23-13.5 LF-MCG/0.5 injection; Inject 0.5 mLs into the muscle once for 1 dose.  Dispense: 0.5 mL; Refill: 0  Family/ staff Communication: Reviewed plan of care with patient and wife   Labs/tests ordered:  - Lipid Panel - CBC with Differential/Platelet - TSH  Next Appointment : one month for smoking cessation re-evaluation   Sandrea Hughs, NP

## 2020-02-26 DIAGNOSIS — D509 Iron deficiency anemia, unspecified: Secondary | ICD-10-CM | POA: Diagnosis not present

## 2020-02-26 DIAGNOSIS — D631 Anemia in chronic kidney disease: Secondary | ICD-10-CM | POA: Diagnosis not present

## 2020-02-26 DIAGNOSIS — N186 End stage renal disease: Secondary | ICD-10-CM | POA: Diagnosis not present

## 2020-02-26 DIAGNOSIS — Z992 Dependence on renal dialysis: Secondary | ICD-10-CM | POA: Diagnosis not present

## 2020-02-26 DIAGNOSIS — N2581 Secondary hyperparathyroidism of renal origin: Secondary | ICD-10-CM | POA: Diagnosis not present

## 2020-02-29 DIAGNOSIS — D631 Anemia in chronic kidney disease: Secondary | ICD-10-CM | POA: Diagnosis not present

## 2020-02-29 DIAGNOSIS — N186 End stage renal disease: Secondary | ICD-10-CM | POA: Diagnosis not present

## 2020-02-29 DIAGNOSIS — Z992 Dependence on renal dialysis: Secondary | ICD-10-CM | POA: Diagnosis not present

## 2020-02-29 DIAGNOSIS — N2581 Secondary hyperparathyroidism of renal origin: Secondary | ICD-10-CM | POA: Diagnosis not present

## 2020-02-29 DIAGNOSIS — D509 Iron deficiency anemia, unspecified: Secondary | ICD-10-CM | POA: Diagnosis not present

## 2020-03-01 DIAGNOSIS — N2581 Secondary hyperparathyroidism of renal origin: Secondary | ICD-10-CM | POA: Diagnosis not present

## 2020-03-01 DIAGNOSIS — E8770 Fluid overload, unspecified: Secondary | ICD-10-CM | POA: Diagnosis not present

## 2020-03-01 DIAGNOSIS — Z992 Dependence on renal dialysis: Secondary | ICD-10-CM | POA: Diagnosis not present

## 2020-03-01 DIAGNOSIS — N186 End stage renal disease: Secondary | ICD-10-CM | POA: Diagnosis not present

## 2020-03-02 DIAGNOSIS — D631 Anemia in chronic kidney disease: Secondary | ICD-10-CM | POA: Diagnosis not present

## 2020-03-02 DIAGNOSIS — D509 Iron deficiency anemia, unspecified: Secondary | ICD-10-CM | POA: Diagnosis not present

## 2020-03-02 DIAGNOSIS — N186 End stage renal disease: Secondary | ICD-10-CM | POA: Diagnosis not present

## 2020-03-02 DIAGNOSIS — N2581 Secondary hyperparathyroidism of renal origin: Secondary | ICD-10-CM | POA: Diagnosis not present

## 2020-03-02 DIAGNOSIS — Z992 Dependence on renal dialysis: Secondary | ICD-10-CM | POA: Diagnosis not present

## 2020-03-03 ENCOUNTER — Other Ambulatory Visit: Payer: Medicare Other

## 2020-03-03 ENCOUNTER — Other Ambulatory Visit: Payer: Self-pay

## 2020-03-03 DIAGNOSIS — I120 Hypertensive chronic kidney disease with stage 5 chronic kidney disease or end stage renal disease: Secondary | ICD-10-CM | POA: Diagnosis not present

## 2020-03-03 DIAGNOSIS — E782 Mixed hyperlipidemia: Secondary | ICD-10-CM | POA: Diagnosis not present

## 2020-03-03 DIAGNOSIS — N186 End stage renal disease: Secondary | ICD-10-CM | POA: Diagnosis not present

## 2020-03-04 DIAGNOSIS — K083 Retained dental root: Secondary | ICD-10-CM | POA: Diagnosis not present

## 2020-03-04 DIAGNOSIS — K029 Dental caries, unspecified: Secondary | ICD-10-CM | POA: Diagnosis not present

## 2020-03-04 DIAGNOSIS — D631 Anemia in chronic kidney disease: Secondary | ICD-10-CM | POA: Diagnosis not present

## 2020-03-04 DIAGNOSIS — K219 Gastro-esophageal reflux disease without esophagitis: Secondary | ICD-10-CM | POA: Diagnosis not present

## 2020-03-04 DIAGNOSIS — Z79899 Other long term (current) drug therapy: Secondary | ICD-10-CM | POA: Diagnosis not present

## 2020-03-04 DIAGNOSIS — K05322 Chronic periodontitis, generalized, moderate: Secondary | ICD-10-CM | POA: Diagnosis not present

## 2020-03-04 DIAGNOSIS — F17210 Nicotine dependence, cigarettes, uncomplicated: Secondary | ICD-10-CM | POA: Diagnosis not present

## 2020-03-04 DIAGNOSIS — K047 Periapical abscess without sinus: Secondary | ICD-10-CM | POA: Diagnosis not present

## 2020-03-04 DIAGNOSIS — Z6832 Body mass index (BMI) 32.0-32.9, adult: Secondary | ICD-10-CM | POA: Diagnosis not present

## 2020-03-04 DIAGNOSIS — E669 Obesity, unspecified: Secondary | ICD-10-CM | POA: Diagnosis not present

## 2020-03-04 DIAGNOSIS — G4733 Obstructive sleep apnea (adult) (pediatric): Secondary | ICD-10-CM | POA: Diagnosis not present

## 2020-03-04 DIAGNOSIS — Z9989 Dependence on other enabling machines and devices: Secondary | ICD-10-CM | POA: Diagnosis not present

## 2020-03-04 DIAGNOSIS — N186 End stage renal disease: Secondary | ICD-10-CM | POA: Diagnosis not present

## 2020-03-04 DIAGNOSIS — E1122 Type 2 diabetes mellitus with diabetic chronic kidney disease: Secondary | ICD-10-CM | POA: Diagnosis not present

## 2020-03-04 DIAGNOSIS — Z940 Kidney transplant status: Secondary | ICD-10-CM | POA: Diagnosis not present

## 2020-03-04 DIAGNOSIS — I120 Hypertensive chronic kidney disease with stage 5 chronic kidney disease or end stage renal disease: Secondary | ICD-10-CM | POA: Diagnosis not present

## 2020-03-04 DIAGNOSIS — K080 Exfoliation of teeth due to systemic causes: Secondary | ICD-10-CM | POA: Diagnosis not present

## 2020-03-04 DIAGNOSIS — K0262 Dental caries on smooth surface penetrating into dentin: Secondary | ICD-10-CM | POA: Diagnosis not present

## 2020-03-04 LAB — CBC WITH DIFFERENTIAL/PLATELET
Absolute Monocytes: 600 cells/uL (ref 200–950)
Basophils Absolute: 72 cells/uL (ref 0–200)
Basophils Relative: 1.5 %
Eosinophils Absolute: 168 cells/uL (ref 15–500)
Eosinophils Relative: 3.5 %
HCT: 35.7 % — ABNORMAL LOW (ref 38.5–50.0)
Hemoglobin: 11.8 g/dL — ABNORMAL LOW (ref 13.2–17.1)
Lymphs Abs: 1032 cells/uL (ref 850–3900)
MCH: 33.2 pg — ABNORMAL HIGH (ref 27.0–33.0)
MCHC: 33.1 g/dL (ref 32.0–36.0)
MCV: 100.6 fL — ABNORMAL HIGH (ref 80.0–100.0)
MPV: 12.1 fL (ref 7.5–12.5)
Monocytes Relative: 12.5 %
Neutro Abs: 2928 cells/uL (ref 1500–7800)
Neutrophils Relative %: 61 %
Platelets: 84 10*3/uL — ABNORMAL LOW (ref 140–400)
RBC: 3.55 10*6/uL — ABNORMAL LOW (ref 4.20–5.80)
RDW: 14.7 % (ref 11.0–15.0)
Total Lymphocyte: 21.5 %
WBC: 4.8 10*3/uL (ref 3.8–10.8)

## 2020-03-04 LAB — LIPID PANEL
Cholesterol: 108 mg/dL (ref <200)
HDL: 29 mg/dL — ABNORMAL LOW (ref >=40)
LDL Cholesterol (Calc): 52 mg/dL (calc)
Non-HDL Cholesterol (Calc): 79 mg/dL (calc) (ref <130)
Total CHOL/HDL Ratio: 3.7 (calc) (ref <5.0)
Triglycerides: 196 mg/dL — ABNORMAL HIGH (ref <150)

## 2020-03-04 LAB — TSH: TSH: 1.38 mIU/L (ref 0.40–4.50)

## 2020-03-05 DIAGNOSIS — D631 Anemia in chronic kidney disease: Secondary | ICD-10-CM | POA: Diagnosis not present

## 2020-03-05 DIAGNOSIS — N186 End stage renal disease: Secondary | ICD-10-CM | POA: Diagnosis not present

## 2020-03-05 DIAGNOSIS — Z992 Dependence on renal dialysis: Secondary | ICD-10-CM | POA: Diagnosis not present

## 2020-03-05 DIAGNOSIS — D509 Iron deficiency anemia, unspecified: Secondary | ICD-10-CM | POA: Diagnosis not present

## 2020-03-05 DIAGNOSIS — N2581 Secondary hyperparathyroidism of renal origin: Secondary | ICD-10-CM | POA: Diagnosis not present

## 2020-03-07 DIAGNOSIS — N186 End stage renal disease: Secondary | ICD-10-CM | POA: Diagnosis not present

## 2020-03-07 DIAGNOSIS — D509 Iron deficiency anemia, unspecified: Secondary | ICD-10-CM | POA: Diagnosis not present

## 2020-03-07 DIAGNOSIS — D631 Anemia in chronic kidney disease: Secondary | ICD-10-CM | POA: Diagnosis not present

## 2020-03-07 DIAGNOSIS — Z992 Dependence on renal dialysis: Secondary | ICD-10-CM | POA: Diagnosis not present

## 2020-03-07 DIAGNOSIS — N2581 Secondary hyperparathyroidism of renal origin: Secondary | ICD-10-CM | POA: Diagnosis not present

## 2020-03-09 DIAGNOSIS — D631 Anemia in chronic kidney disease: Secondary | ICD-10-CM | POA: Diagnosis not present

## 2020-03-09 DIAGNOSIS — D509 Iron deficiency anemia, unspecified: Secondary | ICD-10-CM | POA: Diagnosis not present

## 2020-03-09 DIAGNOSIS — Z992 Dependence on renal dialysis: Secondary | ICD-10-CM | POA: Diagnosis not present

## 2020-03-09 DIAGNOSIS — N2581 Secondary hyperparathyroidism of renal origin: Secondary | ICD-10-CM | POA: Diagnosis not present

## 2020-03-09 DIAGNOSIS — N186 End stage renal disease: Secondary | ICD-10-CM | POA: Diagnosis not present

## 2020-03-10 DIAGNOSIS — Z992 Dependence on renal dialysis: Secondary | ICD-10-CM | POA: Diagnosis not present

## 2020-03-10 DIAGNOSIS — I871 Compression of vein: Secondary | ICD-10-CM | POA: Diagnosis not present

## 2020-03-10 DIAGNOSIS — T82858A Stenosis of vascular prosthetic devices, implants and grafts, initial encounter: Secondary | ICD-10-CM | POA: Diagnosis not present

## 2020-03-10 DIAGNOSIS — N186 End stage renal disease: Secondary | ICD-10-CM | POA: Diagnosis not present

## 2020-03-11 DIAGNOSIS — N2581 Secondary hyperparathyroidism of renal origin: Secondary | ICD-10-CM | POA: Diagnosis not present

## 2020-03-11 DIAGNOSIS — N186 End stage renal disease: Secondary | ICD-10-CM | POA: Diagnosis not present

## 2020-03-11 DIAGNOSIS — Z992 Dependence on renal dialysis: Secondary | ICD-10-CM | POA: Diagnosis not present

## 2020-03-11 DIAGNOSIS — D509 Iron deficiency anemia, unspecified: Secondary | ICD-10-CM | POA: Diagnosis not present

## 2020-03-11 DIAGNOSIS — D631 Anemia in chronic kidney disease: Secondary | ICD-10-CM | POA: Diagnosis not present

## 2020-03-14 ENCOUNTER — Encounter: Payer: Self-pay | Admitting: Internal Medicine

## 2020-03-14 DIAGNOSIS — D509 Iron deficiency anemia, unspecified: Secondary | ICD-10-CM | POA: Diagnosis not present

## 2020-03-14 DIAGNOSIS — Z992 Dependence on renal dialysis: Secondary | ICD-10-CM | POA: Diagnosis not present

## 2020-03-14 DIAGNOSIS — N2581 Secondary hyperparathyroidism of renal origin: Secondary | ICD-10-CM | POA: Diagnosis not present

## 2020-03-14 DIAGNOSIS — D631 Anemia in chronic kidney disease: Secondary | ICD-10-CM | POA: Diagnosis not present

## 2020-03-14 DIAGNOSIS — N186 End stage renal disease: Secondary | ICD-10-CM | POA: Diagnosis not present

## 2020-03-16 DIAGNOSIS — N2581 Secondary hyperparathyroidism of renal origin: Secondary | ICD-10-CM | POA: Diagnosis not present

## 2020-03-16 DIAGNOSIS — Z992 Dependence on renal dialysis: Secondary | ICD-10-CM | POA: Diagnosis not present

## 2020-03-16 DIAGNOSIS — D509 Iron deficiency anemia, unspecified: Secondary | ICD-10-CM | POA: Diagnosis not present

## 2020-03-16 DIAGNOSIS — D631 Anemia in chronic kidney disease: Secondary | ICD-10-CM | POA: Diagnosis not present

## 2020-03-16 DIAGNOSIS — N186 End stage renal disease: Secondary | ICD-10-CM | POA: Diagnosis not present

## 2020-03-18 DIAGNOSIS — Z992 Dependence on renal dialysis: Secondary | ICD-10-CM | POA: Diagnosis not present

## 2020-03-18 DIAGNOSIS — D631 Anemia in chronic kidney disease: Secondary | ICD-10-CM | POA: Diagnosis not present

## 2020-03-18 DIAGNOSIS — N186 End stage renal disease: Secondary | ICD-10-CM | POA: Diagnosis not present

## 2020-03-18 DIAGNOSIS — N2581 Secondary hyperparathyroidism of renal origin: Secondary | ICD-10-CM | POA: Diagnosis not present

## 2020-03-18 DIAGNOSIS — D509 Iron deficiency anemia, unspecified: Secondary | ICD-10-CM | POA: Diagnosis not present

## 2020-03-21 DIAGNOSIS — Z992 Dependence on renal dialysis: Secondary | ICD-10-CM | POA: Diagnosis not present

## 2020-03-21 DIAGNOSIS — D509 Iron deficiency anemia, unspecified: Secondary | ICD-10-CM | POA: Diagnosis not present

## 2020-03-21 DIAGNOSIS — N186 End stage renal disease: Secondary | ICD-10-CM | POA: Diagnosis not present

## 2020-03-21 DIAGNOSIS — N2581 Secondary hyperparathyroidism of renal origin: Secondary | ICD-10-CM | POA: Diagnosis not present

## 2020-03-21 DIAGNOSIS — D631 Anemia in chronic kidney disease: Secondary | ICD-10-CM | POA: Diagnosis not present

## 2020-03-22 DIAGNOSIS — T8612 Kidney transplant failure: Secondary | ICD-10-CM | POA: Diagnosis not present

## 2020-03-22 DIAGNOSIS — N186 End stage renal disease: Secondary | ICD-10-CM | POA: Diagnosis not present

## 2020-03-22 DIAGNOSIS — Z992 Dependence on renal dialysis: Secondary | ICD-10-CM | POA: Diagnosis not present

## 2020-03-23 ENCOUNTER — Other Ambulatory Visit: Payer: Self-pay | Admitting: Family

## 2020-03-23 DIAGNOSIS — N186 End stage renal disease: Secondary | ICD-10-CM | POA: Diagnosis not present

## 2020-03-23 DIAGNOSIS — D631 Anemia in chronic kidney disease: Secondary | ICD-10-CM | POA: Diagnosis not present

## 2020-03-23 DIAGNOSIS — Z992 Dependence on renal dialysis: Secondary | ICD-10-CM | POA: Diagnosis not present

## 2020-03-23 DIAGNOSIS — Z23 Encounter for immunization: Secondary | ICD-10-CM | POA: Diagnosis not present

## 2020-03-23 DIAGNOSIS — N2581 Secondary hyperparathyroidism of renal origin: Secondary | ICD-10-CM | POA: Diagnosis not present

## 2020-03-23 DIAGNOSIS — Z720 Tobacco use: Secondary | ICD-10-CM

## 2020-03-23 NOTE — Telephone Encounter (Signed)
Pharmacy requested refill.  Pended Rx and sent to [facility] for approval due to HIGH ALERT Warning.  

## 2020-03-24 ENCOUNTER — Ambulatory Visit: Payer: Medicare Other | Admitting: Family

## 2020-03-25 DIAGNOSIS — Z992 Dependence on renal dialysis: Secondary | ICD-10-CM | POA: Diagnosis not present

## 2020-03-25 DIAGNOSIS — N186 End stage renal disease: Secondary | ICD-10-CM | POA: Diagnosis not present

## 2020-03-25 DIAGNOSIS — D631 Anemia in chronic kidney disease: Secondary | ICD-10-CM | POA: Diagnosis not present

## 2020-03-25 DIAGNOSIS — Z23 Encounter for immunization: Secondary | ICD-10-CM | POA: Diagnosis not present

## 2020-03-25 DIAGNOSIS — N2581 Secondary hyperparathyroidism of renal origin: Secondary | ICD-10-CM | POA: Diagnosis not present

## 2020-03-28 ENCOUNTER — Telehealth: Payer: Self-pay | Admitting: Nurse Practitioner

## 2020-03-28 DIAGNOSIS — D631 Anemia in chronic kidney disease: Secondary | ICD-10-CM | POA: Diagnosis not present

## 2020-03-28 DIAGNOSIS — Z992 Dependence on renal dialysis: Secondary | ICD-10-CM | POA: Diagnosis not present

## 2020-03-28 DIAGNOSIS — Z23 Encounter for immunization: Secondary | ICD-10-CM | POA: Diagnosis not present

## 2020-03-28 DIAGNOSIS — N2581 Secondary hyperparathyroidism of renal origin: Secondary | ICD-10-CM | POA: Diagnosis not present

## 2020-03-28 DIAGNOSIS — N186 End stage renal disease: Secondary | ICD-10-CM | POA: Diagnosis not present

## 2020-03-28 DIAGNOSIS — K08409 Partial loss of teeth, unspecified cause, unspecified class: Secondary | ICD-10-CM | POA: Diagnosis not present

## 2020-03-28 DIAGNOSIS — Z48814 Encounter for surgical aftercare following surgery on the teeth or oral cavity: Secondary | ICD-10-CM | POA: Diagnosis not present

## 2020-03-28 NOTE — Telephone Encounter (Signed)
AWV - Patient will call back to schedule.

## 2020-03-30 DIAGNOSIS — N2581 Secondary hyperparathyroidism of renal origin: Secondary | ICD-10-CM | POA: Diagnosis not present

## 2020-03-30 DIAGNOSIS — Z23 Encounter for immunization: Secondary | ICD-10-CM | POA: Diagnosis not present

## 2020-03-30 DIAGNOSIS — N186 End stage renal disease: Secondary | ICD-10-CM | POA: Diagnosis not present

## 2020-03-30 DIAGNOSIS — D631 Anemia in chronic kidney disease: Secondary | ICD-10-CM | POA: Diagnosis not present

## 2020-03-30 DIAGNOSIS — Z992 Dependence on renal dialysis: Secondary | ICD-10-CM | POA: Diagnosis not present

## 2020-04-01 DIAGNOSIS — Z992 Dependence on renal dialysis: Secondary | ICD-10-CM | POA: Diagnosis not present

## 2020-04-01 DIAGNOSIS — D631 Anemia in chronic kidney disease: Secondary | ICD-10-CM | POA: Diagnosis not present

## 2020-04-01 DIAGNOSIS — N2581 Secondary hyperparathyroidism of renal origin: Secondary | ICD-10-CM | POA: Diagnosis not present

## 2020-04-01 DIAGNOSIS — N186 End stage renal disease: Secondary | ICD-10-CM | POA: Diagnosis not present

## 2020-04-01 DIAGNOSIS — Z23 Encounter for immunization: Secondary | ICD-10-CM | POA: Diagnosis not present

## 2020-04-04 DIAGNOSIS — Z23 Encounter for immunization: Secondary | ICD-10-CM | POA: Diagnosis not present

## 2020-04-04 DIAGNOSIS — Z992 Dependence on renal dialysis: Secondary | ICD-10-CM | POA: Diagnosis not present

## 2020-04-04 DIAGNOSIS — N186 End stage renal disease: Secondary | ICD-10-CM | POA: Diagnosis not present

## 2020-04-04 DIAGNOSIS — N2581 Secondary hyperparathyroidism of renal origin: Secondary | ICD-10-CM | POA: Diagnosis not present

## 2020-04-04 DIAGNOSIS — D631 Anemia in chronic kidney disease: Secondary | ICD-10-CM | POA: Diagnosis not present

## 2020-04-06 DIAGNOSIS — Z992 Dependence on renal dialysis: Secondary | ICD-10-CM | POA: Diagnosis not present

## 2020-04-06 DIAGNOSIS — N2581 Secondary hyperparathyroidism of renal origin: Secondary | ICD-10-CM | POA: Diagnosis not present

## 2020-04-06 DIAGNOSIS — D631 Anemia in chronic kidney disease: Secondary | ICD-10-CM | POA: Diagnosis not present

## 2020-04-06 DIAGNOSIS — N186 End stage renal disease: Secondary | ICD-10-CM | POA: Diagnosis not present

## 2020-04-06 DIAGNOSIS — Z23 Encounter for immunization: Secondary | ICD-10-CM | POA: Diagnosis not present

## 2020-04-08 DIAGNOSIS — Z23 Encounter for immunization: Secondary | ICD-10-CM | POA: Diagnosis not present

## 2020-04-08 DIAGNOSIS — N186 End stage renal disease: Secondary | ICD-10-CM | POA: Diagnosis not present

## 2020-04-08 DIAGNOSIS — D631 Anemia in chronic kidney disease: Secondary | ICD-10-CM | POA: Diagnosis not present

## 2020-04-08 DIAGNOSIS — N2581 Secondary hyperparathyroidism of renal origin: Secondary | ICD-10-CM | POA: Diagnosis not present

## 2020-04-08 DIAGNOSIS — Z992 Dependence on renal dialysis: Secondary | ICD-10-CM | POA: Diagnosis not present

## 2020-04-11 DIAGNOSIS — Z992 Dependence on renal dialysis: Secondary | ICD-10-CM | POA: Diagnosis not present

## 2020-04-11 DIAGNOSIS — N186 End stage renal disease: Secondary | ICD-10-CM | POA: Diagnosis not present

## 2020-04-11 DIAGNOSIS — Z23 Encounter for immunization: Secondary | ICD-10-CM | POA: Diagnosis not present

## 2020-04-11 DIAGNOSIS — D631 Anemia in chronic kidney disease: Secondary | ICD-10-CM | POA: Diagnosis not present

## 2020-04-11 DIAGNOSIS — N2581 Secondary hyperparathyroidism of renal origin: Secondary | ICD-10-CM | POA: Diagnosis not present

## 2020-04-13 DIAGNOSIS — N2581 Secondary hyperparathyroidism of renal origin: Secondary | ICD-10-CM | POA: Diagnosis not present

## 2020-04-13 DIAGNOSIS — D631 Anemia in chronic kidney disease: Secondary | ICD-10-CM | POA: Diagnosis not present

## 2020-04-13 DIAGNOSIS — Z992 Dependence on renal dialysis: Secondary | ICD-10-CM | POA: Diagnosis not present

## 2020-04-13 DIAGNOSIS — Z23 Encounter for immunization: Secondary | ICD-10-CM | POA: Diagnosis not present

## 2020-04-13 DIAGNOSIS — N186 End stage renal disease: Secondary | ICD-10-CM | POA: Diagnosis not present

## 2020-04-15 DIAGNOSIS — N2581 Secondary hyperparathyroidism of renal origin: Secondary | ICD-10-CM | POA: Diagnosis not present

## 2020-04-15 DIAGNOSIS — N186 End stage renal disease: Secondary | ICD-10-CM | POA: Diagnosis not present

## 2020-04-15 DIAGNOSIS — Z992 Dependence on renal dialysis: Secondary | ICD-10-CM | POA: Diagnosis not present

## 2020-04-15 DIAGNOSIS — D631 Anemia in chronic kidney disease: Secondary | ICD-10-CM | POA: Diagnosis not present

## 2020-04-15 DIAGNOSIS — Z23 Encounter for immunization: Secondary | ICD-10-CM | POA: Diagnosis not present

## 2020-04-18 DIAGNOSIS — N2581 Secondary hyperparathyroidism of renal origin: Secondary | ICD-10-CM | POA: Diagnosis not present

## 2020-04-18 DIAGNOSIS — Z23 Encounter for immunization: Secondary | ICD-10-CM | POA: Diagnosis not present

## 2020-04-18 DIAGNOSIS — N186 End stage renal disease: Secondary | ICD-10-CM | POA: Diagnosis not present

## 2020-04-18 DIAGNOSIS — D631 Anemia in chronic kidney disease: Secondary | ICD-10-CM | POA: Diagnosis not present

## 2020-04-18 DIAGNOSIS — Z992 Dependence on renal dialysis: Secondary | ICD-10-CM | POA: Diagnosis not present

## 2020-04-20 DIAGNOSIS — D631 Anemia in chronic kidney disease: Secondary | ICD-10-CM | POA: Diagnosis not present

## 2020-04-20 DIAGNOSIS — N2581 Secondary hyperparathyroidism of renal origin: Secondary | ICD-10-CM | POA: Diagnosis not present

## 2020-04-20 DIAGNOSIS — Z23 Encounter for immunization: Secondary | ICD-10-CM | POA: Diagnosis not present

## 2020-04-20 DIAGNOSIS — N186 End stage renal disease: Secondary | ICD-10-CM | POA: Diagnosis not present

## 2020-04-20 DIAGNOSIS — Z992 Dependence on renal dialysis: Secondary | ICD-10-CM | POA: Diagnosis not present

## 2020-04-22 DIAGNOSIS — N2581 Secondary hyperparathyroidism of renal origin: Secondary | ICD-10-CM | POA: Diagnosis not present

## 2020-04-22 DIAGNOSIS — Z992 Dependence on renal dialysis: Secondary | ICD-10-CM | POA: Diagnosis not present

## 2020-04-22 DIAGNOSIS — N186 End stage renal disease: Secondary | ICD-10-CM | POA: Diagnosis not present

## 2020-04-22 DIAGNOSIS — D631 Anemia in chronic kidney disease: Secondary | ICD-10-CM | POA: Diagnosis not present

## 2020-04-22 DIAGNOSIS — T8612 Kidney transplant failure: Secondary | ICD-10-CM | POA: Diagnosis not present

## 2020-04-25 DIAGNOSIS — Z992 Dependence on renal dialysis: Secondary | ICD-10-CM | POA: Diagnosis not present

## 2020-04-25 DIAGNOSIS — N2581 Secondary hyperparathyroidism of renal origin: Secondary | ICD-10-CM | POA: Diagnosis not present

## 2020-04-25 DIAGNOSIS — D631 Anemia in chronic kidney disease: Secondary | ICD-10-CM | POA: Diagnosis not present

## 2020-04-25 DIAGNOSIS — N186 End stage renal disease: Secondary | ICD-10-CM | POA: Diagnosis not present

## 2020-04-27 DIAGNOSIS — D631 Anemia in chronic kidney disease: Secondary | ICD-10-CM | POA: Diagnosis not present

## 2020-04-27 DIAGNOSIS — N186 End stage renal disease: Secondary | ICD-10-CM | POA: Diagnosis not present

## 2020-04-27 DIAGNOSIS — N2581 Secondary hyperparathyroidism of renal origin: Secondary | ICD-10-CM | POA: Diagnosis not present

## 2020-04-27 DIAGNOSIS — Z992 Dependence on renal dialysis: Secondary | ICD-10-CM | POA: Diagnosis not present

## 2020-04-28 ENCOUNTER — Other Ambulatory Visit: Payer: Self-pay

## 2020-04-28 ENCOUNTER — Ambulatory Visit (INDEPENDENT_AMBULATORY_CARE_PROVIDER_SITE_OTHER): Payer: Medicare Other | Admitting: Family

## 2020-04-28 ENCOUNTER — Encounter: Payer: Self-pay | Admitting: Family

## 2020-04-28 VITALS — BP 132/66 | HR 67 | Temp 96.9°F | Ht 67.0 in | Wt 203.0 lb

## 2020-04-28 DIAGNOSIS — L602 Onychogryphosis: Secondary | ICD-10-CM | POA: Diagnosis not present

## 2020-04-28 DIAGNOSIS — Z720 Tobacco use: Secondary | ICD-10-CM

## 2020-04-28 DIAGNOSIS — F419 Anxiety disorder, unspecified: Secondary | ICD-10-CM | POA: Diagnosis not present

## 2020-04-28 DIAGNOSIS — M170 Bilateral primary osteoarthritis of knee: Secondary | ICD-10-CM | POA: Diagnosis not present

## 2020-04-28 MED ORDER — TRAMADOL HCL 50 MG PO TABS: 50.0000 mg | ORAL_TABLET | Freq: Every day | ORAL | 0 refills | Status: DC | PRN

## 2020-04-28 MED ORDER — CLONAZEPAM 0.5 MG PO TABS: 0.5000 mg | ORAL_TABLET | Freq: Two times a day (BID) | ORAL | 0 refills | Status: DC | PRN

## 2020-04-28 NOTE — Patient Instructions (Signed)
-   continue on Wellbutrin

## 2020-04-28 NOTE — Progress Notes (Signed)
Provider: Marlowe Sax FNP-C  Coree Brame, Nelda Bucks, NP  Patient Care Team: Emrah Ariola, Nelda Bucks, NP as PCP - General (Family Medicine) [name], MD as PCP - Cardiology (Cardiology) Jovita Kussmaul, MD as Consulting Physician (General Surgery) [name], MD as Consulting Physician (Pulmonary Disease) Estanislado Emms, MD (Inactive) as Consulting Physician (Nephrology) Elam Dutch, MD as Consulting Physician (Vascular Surgery) [name], MD as Consulting Physician (Cardiology) Milus Banister, MD as Attending Physician (Gastroenterology) Jamal Maes, MD as Consulting Physician (Nephrology)  Extended Emergency Contact Information Primary Emergency Contact: [facility] Address: [address]          [place], [place] [address] Johnnette Litter of [place] Phone: [phone] Mobile Phone: [phone] Relation: Spouse  Code Status:  Full Code  Goals of care: Advanced Directive information Advanced Directives 10/22/2019  Does Patient Have a Medical Advance Directive? No  Does patient want to make changes to medical advance directive? -  Would patient like information on creating a medical advance directive? No - Patient declined  Pre-existing out of facility DNR order (yellow form or pink MOST form) -     Chief Complaint  Patient presents with  . Acute Visit    Discuss medication for smoking. Patient c/o of feeling sick all the time. Dialysis center addressed possible UTI , sent rx for antibiotic to pharmacy, patient has not picked up yet.     HPI:  Pt is a 60 y.o. male seen today for an acute visit for follow up smoking cessation.Kirk [name] snot been feeling well gets dizzy and weak.dialysis center call in antibiotics for UTI has not picked up medication but will do it today.He denies any abdominal pain.He is ESRD on dialysis. He was started on bupropion 150 mg 24 hr tablet on previous visit for smoking cessation.states has quit smoking had no cigarette in the past 5  days.He states does not want to go back on smoking again.  He request medication to help sleep.States wakes up during  The night after 3-4 hrs and unable to go back to sleep.Does take clonazepam at bedtime but does not help with sleep just makes him groggy.Clonazepam does helps with his anxiety. Has tried melatonin in the past but does not recall whether it worked well or not.  Also complains of right great toe thick overgrown and discolored would like podiatrist to treat and trim. No pain or drainage.      Past Medical History:  Diagnosis Date  . Acute edema of lung, unspecified   . Acute, but ill-defined, cerebrovascular disease   . Allergy   . Anemia   . 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)   . Anxiety   . Asthma   . Asthma    moderate persistent  . Carpal tunnel syndrome   . Cellulitis and abscess of trunk   . Cholelithiasis 07/13/2014  . Chronic headaches   . Debility, unspecified   . Dermatophytosis of the body   . Dysrhythmia    history of  . Edema   . End stage renal disease on dialysis [facility])    "MWF; [facility]" (10/21/2014)  . Essential hypertension, benign   . GERD (gastroesophageal reflux disease)   . Gout, unspecified   . HTN (hypertension)   . Hypertrophy of prostate without urinary obstruction and other lower urinary tract symptoms (LUTS)   . Hypotension, unspecified   . Impotence of organic origin   . Insomnia, unspecified   . Kidney replaced by transplant   .  Localization-related (focal) (partial) epilepsy and epileptic syndromes with complex partial seizures, without mention of intractable epilepsy    12-15-19- Wife states he has NEVER had a seizure   . Lumbago   . Memory loss   . OSA on CPAP   . Other and unspecified hyperlipidemia    controlled /managed per wife   . Other chronic nonalcoholic liver disease   . Other malaise and fatigue   . Other nonspecific abnormal serum enzyme levels   . Pain in joint, lower leg   . Pain in  joint, upper arm   . Pneumonia "several times"  . Renal dialysis status(V45.11) 02/05/2010   restarted 01/02/13 ofter renal trransplant failure  . Secondary hyperparathyroidism (of renal origin)   . Shortness of breath   . Sleep apnea    wears cpap   . Tension headache   . Unspecified constipation   . Unspecified essential hypertension   . Unspecified hereditary and idiopathic peripheral neuropathy   . Unspecified vitamin D deficiency    Past Surgical History:  Procedure Laterality Date  . AV FISTULA PLACEMENT Left ?2010   "forearm; at [facility]"  . BACK SURGERY    . CARDIAC CATHETERIZATION  03/21/2011  . CHOLECYSTECTOMY N/A 10/21/2014   Procedure: LAPAROSCOPIC CHOLECYSTECTOMY WITH INTRAOPERATIVE CHOLANGIOGRAM;  Surgeon: Autumn Messing III, MD;  Location: [facility];  Service: General;  Laterality: N/A;  . COLONOSCOPY    . 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 KIDNEY TRANSPLANT  08/17/2011   [place]  10/21/2014   w/IOC  . LEFT HEART CATHETERIZATION WITH CORONARY ANGIOGRAM N/A 03/21/2011   Procedure: LEFT HEART CATHETERIZATION WITH CORONARY ANGIOGRAM;  Surgeon: [name], MD;  Location: [facility] CATH LAB;  Service: Cardiovascular;  Laterality: N/A;  . NEPHRECTOMY  08/2013   removed transplaned kidney  . POLYPECTOMY    . POSTERIOR FUSION CERVICAL SPINE  06/25/2012   for spinal stenosis  . VASECTOMY  2010    Allergies  Allergen Reactions  . Codeine Nausea And Vomiting    Outpatient Encounter Medications as of 04/28/2020  Medication Sig  . acetaminophen (TYLENOL) 500 MG tablet Take 1 tablet (500 mg total) by mouth every 6 (six) hours as needed for moderate pain.  Marland Kitchen albuterol (PROVENTIL HFA;VENTOLIN HFA) 108 (90 Base) MCG/ACT inhaler Inhale 2 puffs into the lungs every 6 (six) hours as needed for wheezing or shortness of breath.  Marland Kitchen amLODipine (NORVASC) 10 MG tablet TAKE 1 TABLET BY MOUTH EVERY DAY  . aspirin EC 81 MG tablet  Take 81 mg by mouth daily.  Marland Kitchen b complex-vitamin c-folic acid (NEPHRO-VITE) 0.8 MG TABS tablet TAKE 1 TABLET BY MOUTH EVERY DAY  . budesonide-formoterol (SYMBICORT) 160-4.5 MCG/ACT inhaler Inhale 2 puffs into the lungs 2 (two) times daily.  Marland Kitchen buPROPion (WELLBUTRIN XL) 150 MG 24 hr tablet TAKE 1 TABLET BY MOUTH EVERY DAY  . calcitRIOL (ROCALTROL) 0.5 MCG capsule Take 2 capsules (1 mcg total) by mouth every Monday, Wednesday, and Friday with hemodialysis.  . Cholecalciferol (VITAMIN D3) 5000 UNITS TABS Take 5,000 Units by mouth daily.   . cinacalcet (SENSIPAR) 90 MG tablet Take 90 mg by mouth every evening.  . clonazePAM (KLONOPIN) 0.5 MG tablet Take 1 tablet (0.5 mg total) by mouth 2 (two) times daily as needed for anxiety.  . cloNIDine (CATAPRES) 0.1 MG tablet Take 0.1 mg by mouth at bedtime.  . diphenhydrAMINE (BENADRYL) 25 MG tablet Take 50 mg by  mouth at bedtime.  Marland Kitchen doxazosin (CARDURA) 2 MG tablet Take 2 mg by mouth daily.  . famotidine (PEPCID) 20 MG tablet Take 1 tablet (20 mg total) by mouth at bedtime.  Marland Kitchen guaiFENesin (MUCINEX) 600 MG 12 hr tablet Take 1,200 mg by mouth 2 (two) times daily.  . hydrALAZINE (APRESOLINE) 100 MG tablet Take 1 tablet (100 mg total) by mouth in the morning and at bedtime.  Marland Kitchen ipratropium-albuterol (DUONEB) 0.5-2.5 (3) MG/3ML SOLN Take 3 mLs by nebulization every 4 (four) hours as needed (Shortness of breath).  . isosorbide mononitrate (IMDUR) 30 MG 24 hr tablet TAKE 1 TABLET DAILY  . lidocaine-prilocaine (EMLA) cream Apply 1 application topically See admin instructions. Apply 1 to 2 hours prior to dialysis on Mondays, Wednesdays, and Fridays. Cover with saran wrap.  . metoprolol tartrate (LOPRESSOR) 100 MG tablet TAKE 1 TABLET BY MOUTH TWICE A DAY  . omeprazole (PRILOSEC) 20 MG capsule TAKE 1 CAPSULE TWICE DAILY BEFORE A MEAL  . ondansetron (ZOFRAN ODT) 4 MG disintegrating tablet Take 1 tablet (4 mg total) by mouth every 8 (eight) hours as needed for nausea or  vomiting.  . pravastatin (PRAVACHOL) 40 MG tablet TAKE 1 TABLET BY MOUTH EVERY DAY IN THE EVENING  . PRESCRIPTION MEDICATION See admin instructions. CPAP- At bedtime and during any time of rest  . RENVELA 800 MG tablet Take 800-2,400 mg by mouth See admin instructions. Take 2,400 mg by mouth three times a day with meals and 800 mg with each snack  . traMADol (ULTRAM) 50 MG tablet Take 1 tablet (50 mg total) by mouth daily as needed.   No facility-administered encounter medications on file as of 04/28/2020.    Review of Systems  Constitutional: Negative for appetite change, chills, fatigue and fever.  HENT: Positive for hearing loss. Negative for congestion, postnasal drip, rhinorrhea, sinus pressure, sinus pain, sneezing and sore throat.   Eyes: Negative for discharge, redness, itching and visual disturbance.  Respiratory: Negative for cough, chest tightness, shortness of breath and wheezing.   Cardiovascular: Negative for chest pain, palpitations and leg swelling.  Gastrointestinal: Negative for abdominal distention, abdominal pain, constipation, diarrhea, nausea and vomiting.  Endocrine: Negative for cold intolerance, heat intolerance, polydipsia, polyphagia and polyuria.  Genitourinary: Negative for flank pain and urgency.       On dialysis   Musculoskeletal: Positive for arthralgias. Negative for gait problem, joint swelling and myalgias.  Skin: Negative for color change, pallor and rash.  Neurological: Negative for dizziness, speech difficulty, weakness, light-headedness, numbness and headaches.  Hematological: Does not bruise/bleed easily.  Psychiatric/Behavioral: Positive for sleep disturbance. Negative for agitation, behavioral problems and decreased concentration. The patient is not nervous/anxious.     Immunization History  Administered Date(s) Administered  . Influenza Whole 11/01/2010  . Influenza, Seasonal, Injecte, Preservative Fre 10/29/2012  . Influenza-Unspecified  11/21/2011, 11/05/2012, 09/22/2013, 09/22/2017, 11/23/2019  . Moderna Sars-Covid-2 Vaccination 04/06/2019, 05/07/2019  . PPD Test 12/26/2012  . Pneumococcal Polysaccharide-23 11/01/2010, 10/29/2012  . Pneumococcal-Unspecified 11/05/2012  . Tdap 01/22/2006   Pertinent  Health Maintenance Due  Topic Date Due  . HEMOGLOBIN A1C  04/20/2020  . INFLUENZA VACCINE  08/22/2020  . COLONOSCOPY (Pts 45-73yrs Insurance coverage will need to be confirmed)  12/29/2026  . FOOT EXAM  Discontinued  . OPHTHALMOLOGY EXAM  Discontinued   Fall Risk  04/28/2020 10/22/2019 08/04/2019 06/18/2019 02/05/2019  Falls in the past year? 0 0 0 0 -  Number falls in past yr: 0 - 0 0 1  Injury with Fall? 0 0 0 0 1  Comment - - - - legs scratch   Functional Status Survey:    Vitals:   04/28/20 1058  BP: 132/66  Pulse: 67  Temp: (!) 96.9 F (36.1 C)  TempSrc: Temporal  SpO2: 99%  Weight: 203 lb (92.1 kg)  Height: 5\' 7"  (1.702 m)   Body mass index is 31.79 kg/m. Physical Exam Vitals reviewed.  Constitutional:      General: He is not in acute distress.    Appearance: He is obese. He is not ill-appearing.  HENT:     Head: Normocephalic.  Eyes:     General: No scleral icterus.       Right eye: No discharge.        Left eye: No discharge.     Conjunctiva/sclera: Conjunctivae normal.     Pupils: Pupils are equal, round, and reactive to light.  Neck:     Vascular: No carotid bruit.  Cardiovascular:     Rate and Rhythm: Normal rate and regular rhythm.     Pulses: Normal pulses.     Heart sounds: Normal heart sounds. No murmur heard. No friction rub. No gallop.   Pulmonary:     Effort: Pulmonary effort is normal. No respiratory distress.     Breath sounds: Normal breath sounds. No wheezing, rhonchi or rales.  Chest:     Chest wall: No tenderness.  Abdominal:     General: Bowel sounds are normal. There is no distension.     Palpations: Abdomen is soft. There is no mass.     Tenderness: There is no  abdominal tenderness. There is no right CVA tenderness, left CVA tenderness, guarding or rebound.  Musculoskeletal:        General: No swelling or tenderness. Normal range of motion.     Cervical back: Normal range of motion. No rigidity or tenderness.     Right lower leg: No edema.     Left lower leg: No edema.  Feet:     Right foot:     Skin integrity: No ulcer, skin breakdown, erythema, warmth or callus.     Toenail Condition: Right toenails are abnormally thick and long.     Left foot:     Skin integrity: No ulcer, skin breakdown, erythema, warmth or callus.  Lymphadenopathy:     Cervical: No cervical adenopathy.  Skin:    General: Skin is warm.     Coloration: Skin is not pale.     Findings: No bruising, erythema or rash.  Neurological:     Mental Status: He is alert and oriented to person, place, and time.     Cranial Nerves: No cranial nerve deficit.     Sensory: No sensory deficit.     Motor: No weakness.     Coordination: Coordination normal.  Psychiatric:        Mood and Affect: Mood normal.        Speech: Speech normal.        Behavior: Behavior normal.        Thought Content: Thought content normal.        Judgment: Judgment normal.     Labs reviewed: Recent Labs    07/15/19 0704  NA 139  K 5.0  CL 101  CO2 23  GLUCOSE 109*  BUN 45*  CREATININE 13.39*  CALCIUM 9.0   Recent Labs    07/15/19 0704  AST 15  ALT 15  ALKPHOS 57  BILITOT 1.1  PROT 6.7  ALBUMIN 3.6   Recent Labs    07/15/19 0704 07/15/19 1431 10/22/19 1602 03/03/20 0833  WBC 5.3  --  4.3 4.8  NEUTROABS  --   --  2,829 2,928  HGB 10.4* 10.4* 10.9* 11.8*  HCT 33.2* 33.0* 32.8* 35.7*  MCV 101.5*  --  97.6 100.6*  PLT 99*  --  74* 84*   Lab Results  Component Value Date   TSH 1.38 03/03/2020   Lab Results  Component Value Date   HGBA1C 5.0 10/22/2019   Lab Results  Component Value Date   CHOL 108 03/03/2020   HDL 29 (L) 03/03/2020   LDLCALC 52 03/03/2020   TRIG 196 (H)  03/03/2020   CHOLHDL 3.7 03/03/2020    Significant Diagnostic Results in last 30 days:  No results found.  Assessment/Plan  1. Bilateral primary osteoarthritis of knee Tramadol effective. - traMADol (ULTRAM) 50 MG tablet; Take 1 tablet (50 mg total) by mouth daily as needed.  Dispense: 30 tablet; Refill: 0  2. Anxiety Stable on clonazepam. - clonazePAM (KLONOPIN) 0.5 MG tablet; Take 1 tablet (0.5 mg total) by mouth 2 (two) times daily as needed for anxiety.  Dispense: 60 tablet; Refill: 0  3. Tobacco abuse disorder Has not had a cigarette for the past 5 days.excited to quit.congratulated today for quitting.  - continue on Bupropion   4. Overgrown toenails Thick overgrown toenails with nail discoloration.refer to Podiatrist for evaluation.  - Ambulatory referral to Podiatry  Family/ staff Communication: Reviewed plan of care with patient and wife   Labs/tests ordered: None   Next Appointment: 6 months for medical management of chronic issues.Fasting Labs prior to visit.    Kirk Hughs, NP

## 2020-04-29 DIAGNOSIS — Z992 Dependence on renal dialysis: Secondary | ICD-10-CM | POA: Diagnosis not present

## 2020-04-29 DIAGNOSIS — D631 Anemia in chronic kidney disease: Secondary | ICD-10-CM | POA: Diagnosis not present

## 2020-04-29 DIAGNOSIS — N2581 Secondary hyperparathyroidism of renal origin: Secondary | ICD-10-CM | POA: Diagnosis not present

## 2020-04-29 DIAGNOSIS — N186 End stage renal disease: Secondary | ICD-10-CM | POA: Diagnosis not present

## 2020-05-02 DIAGNOSIS — N186 End stage renal disease: Secondary | ICD-10-CM | POA: Diagnosis not present

## 2020-05-02 DIAGNOSIS — Z992 Dependence on renal dialysis: Secondary | ICD-10-CM | POA: Diagnosis not present

## 2020-05-02 DIAGNOSIS — N2581 Secondary hyperparathyroidism of renal origin: Secondary | ICD-10-CM | POA: Diagnosis not present

## 2020-05-02 DIAGNOSIS — D631 Anemia in chronic kidney disease: Secondary | ICD-10-CM | POA: Diagnosis not present

## 2020-05-04 DIAGNOSIS — N186 End stage renal disease: Secondary | ICD-10-CM | POA: Diagnosis not present

## 2020-05-04 DIAGNOSIS — D631 Anemia in chronic kidney disease: Secondary | ICD-10-CM | POA: Diagnosis not present

## 2020-05-04 DIAGNOSIS — N2581 Secondary hyperparathyroidism of renal origin: Secondary | ICD-10-CM | POA: Diagnosis not present

## 2020-05-04 DIAGNOSIS — Z992 Dependence on renal dialysis: Secondary | ICD-10-CM | POA: Diagnosis not present

## 2020-05-05 ENCOUNTER — Other Ambulatory Visit: Payer: Self-pay

## 2020-05-05 ENCOUNTER — Ambulatory Visit (INDEPENDENT_AMBULATORY_CARE_PROVIDER_SITE_OTHER): Payer: Medicare Other | Admitting: Podiatry

## 2020-05-05 ENCOUNTER — Encounter: Payer: Self-pay | Admitting: Podiatry

## 2020-05-05 DIAGNOSIS — M79675 Pain in left toe(s): Secondary | ICD-10-CM

## 2020-05-05 DIAGNOSIS — B351 Tinea unguium: Secondary | ICD-10-CM

## 2020-05-05 DIAGNOSIS — M79674 Pain in right toe(s): Secondary | ICD-10-CM | POA: Diagnosis not present

## 2020-05-05 NOTE — Progress Notes (Signed)
  Subjective:  Patient ID: Kirk [name], male    DOB: [birth year],  MRN: [id number]  Chief Complaint  Patient presents with  . Nail Problem    Thick/painful toenails-possible b/l fungal infection-desires a medication that was seen on TV, name unknown.     60 y.o. male presents with the above complaint. History confirmed with patient.   Objective:  Physical Exam: warm, good capillary refill, no trophic changes or ulcerative lesions, normal DP and PT pulses and normal sensory exam.  Severe thickened elongated toenails with brown discoloration onychomycosis x10 Assessment:   1. Pain due to onychomycosis of toenails of both feet      Plan:  Patient was evaluated and treated and all questions answered.  Discussed the etiology and treatment options for the condition in detail with the patient. Educated patient on the topical and oral treatment options for mycotic nails. Recommended debridement of the nails today. Sharp and mechanical debridement performed of all painful and mycotic nails today. Nails debrided in length and thickness using a nail nipper to level of comfort. Discussed treatment options including appropriate shoe gear. Follow up as needed for painful nails.   Return in about 3 months (around 08/04/2020).

## 2020-05-06 ENCOUNTER — Emergency Department (HOSPITAL_COMMUNITY)
Admission: EM | Admit: 2020-05-06 | Discharge: 2020-05-06 | Disposition: A | Discharge status: Home / Self Care | Payer: Medicare Other | Attending: Emergency Medicine | Admitting: Emergency Medicine

## 2020-05-06 ENCOUNTER — Emergency Department (HOSPITAL_COMMUNITY): Payer: Medicare Other

## 2020-05-06 DIAGNOSIS — Z7982 Long term (current) use of aspirin: Secondary | ICD-10-CM | POA: Diagnosis not present

## 2020-05-06 DIAGNOSIS — M4802 Spinal stenosis, cervical region: Secondary | ICD-10-CM | POA: Diagnosis not present

## 2020-05-06 DIAGNOSIS — Z85828 Personal history of other malignant neoplasm of skin: Secondary | ICD-10-CM | POA: Diagnosis not present

## 2020-05-06 DIAGNOSIS — I5022 Chronic systolic (congestive) heart failure: Secondary | ICD-10-CM | POA: Insufficient documentation

## 2020-05-06 DIAGNOSIS — Z79899 Other long term (current) drug therapy: Secondary | ICD-10-CM | POA: Insufficient documentation

## 2020-05-06 DIAGNOSIS — Z992 Dependence on renal dialysis: Secondary | ICD-10-CM | POA: Diagnosis not present

## 2020-05-06 DIAGNOSIS — M25511 Pain in right shoulder: Secondary | ICD-10-CM | POA: Diagnosis not present

## 2020-05-06 DIAGNOSIS — N2581 Secondary hyperparathyroidism of renal origin: Secondary | ICD-10-CM | POA: Diagnosis not present

## 2020-05-06 DIAGNOSIS — R519 Headache, unspecified: Secondary | ICD-10-CM | POA: Insufficient documentation

## 2020-05-06 DIAGNOSIS — M549 Dorsalgia, unspecified: Secondary | ICD-10-CM | POA: Diagnosis not present

## 2020-05-06 DIAGNOSIS — Z87891 Personal history of nicotine dependence: Secondary | ICD-10-CM | POA: Insufficient documentation

## 2020-05-06 DIAGNOSIS — Z7951 Long term (current) use of inhaled steroids: Secondary | ICD-10-CM | POA: Insufficient documentation

## 2020-05-06 DIAGNOSIS — I132 Hypertensive heart and chronic kidney disease with heart failure and with stage 5 chronic kidney disease, or end stage renal disease: Secondary | ICD-10-CM | POA: Diagnosis not present

## 2020-05-06 DIAGNOSIS — Z041 Encounter for examination and observation following transport accident: Secondary | ICD-10-CM | POA: Diagnosis not present

## 2020-05-06 DIAGNOSIS — J4540 Moderate persistent asthma, uncomplicated: Secondary | ICD-10-CM | POA: Diagnosis not present

## 2020-05-06 DIAGNOSIS — E1122 Type 2 diabetes mellitus with diabetic chronic kidney disease: Secondary | ICD-10-CM | POA: Insufficient documentation

## 2020-05-06 DIAGNOSIS — Y92410 Unspecified street and highway as the place of occurrence of the external cause: Secondary | ICD-10-CM | POA: Diagnosis not present

## 2020-05-06 DIAGNOSIS — R1030 Lower abdominal pain, unspecified: Secondary | ICD-10-CM | POA: Insufficient documentation

## 2020-05-06 DIAGNOSIS — M542 Cervicalgia: Secondary | ICD-10-CM | POA: Insufficient documentation

## 2020-05-06 DIAGNOSIS — D631 Anemia in chronic kidney disease: Secondary | ICD-10-CM | POA: Diagnosis not present

## 2020-05-06 DIAGNOSIS — M25561 Pain in right knee: Secondary | ICD-10-CM | POA: Diagnosis not present

## 2020-05-06 DIAGNOSIS — M25512 Pain in left shoulder: Secondary | ICD-10-CM | POA: Diagnosis not present

## 2020-05-06 DIAGNOSIS — M546 Pain in thoracic spine: Secondary | ICD-10-CM | POA: Diagnosis not present

## 2020-05-06 DIAGNOSIS — N186 End stage renal disease: Secondary | ICD-10-CM | POA: Diagnosis not present

## 2020-05-06 DIAGNOSIS — M25519 Pain in unspecified shoulder: Secondary | ICD-10-CM | POA: Diagnosis not present

## 2020-05-06 LAB — CBC WITH DIFFERENTIAL/PLATELET
Abs Immature Granulocytes: 0.04 10*3/uL (ref 0.00–0.07)
Basophils Absolute: 0 10*3/uL (ref 0.0–0.1)
Basophils Relative: 1 %
Eosinophils Absolute: 0.1 10*3/uL (ref 0.0–0.5)
Eosinophils Relative: 2 %
HCT: 37.5 % — ABNORMAL LOW (ref 39.0–52.0)
Hemoglobin: 12.6 g/dL — ABNORMAL LOW (ref 13.0–17.0)
Immature Granulocytes: 1 %
Lymphocytes Relative: 23 %
Lymphs Abs: 1.1 10*3/uL (ref 0.7–4.0)
MCH: 35.2 pg — ABNORMAL HIGH (ref 26.0–34.0)
MCHC: 33.6 g/dL (ref 30.0–36.0)
MCV: 104.7 fL — ABNORMAL HIGH (ref 80.0–100.0)
Monocytes Absolute: 0.6 10*3/uL (ref 0.1–1.0)
Monocytes Relative: 13 %
Neutro Abs: 2.7 10*3/uL (ref 1.7–7.7)
Neutrophils Relative %: 60 %
Platelets: 85 10*3/uL — ABNORMAL LOW (ref 150–400)
RBC: 3.58 MIL/uL — ABNORMAL LOW (ref 4.22–5.81)
RDW: 16.3 % — ABNORMAL HIGH (ref 11.5–15.5)
WBC: 4.5 10*3/uL (ref 4.0–10.5)
nRBC: 0 % (ref 0.0–0.2)

## 2020-05-06 LAB — COMPREHENSIVE METABOLIC PANEL
ALT: 30 U/L (ref 0–44)
AST: 34 U/L (ref 15–41)
Albumin: 3.9 g/dL (ref 3.5–5.0)
Alkaline Phosphatase: 72 U/L (ref 38–126)
Anion gap: 9 (ref 5–15)
BUN: 18 mg/dL (ref 6–20)
CO2: 28 mmol/L (ref 22–32)
Calcium: 9.6 mg/dL (ref 8.9–10.3)
Chloride: 99 mmol/L (ref 98–111)
Creatinine, Ser: 6.51 mg/dL — ABNORMAL HIGH (ref 0.61–1.24)
GFR, Estimated: 9 mL/min — ABNORMAL LOW (ref >60)
Glucose, Bld: 106 mg/dL — ABNORMAL HIGH (ref 70–99)
Potassium: 3.1 mmol/L — ABNORMAL LOW (ref 3.5–5.1)
Sodium: 136 mmol/L (ref 135–145)
Total Bilirubin: 0.9 mg/dL (ref 0.3–1.2)
Total Protein: 7.7 g/dL (ref 6.5–8.1)

## 2020-05-06 MED ORDER — IOHEXOL 300 MG/ML  SOLN
100.0000 mL | Freq: Once | INTRAMUSCULAR | Status: AC | PRN
  Administered 2020-05-06: 100 mL via INTRAVENOUS

## 2020-05-06 MED ORDER — CYCLOBENZAPRINE HCL 5 MG PO TABS: 5.0000 mg | ORAL_TABLET | Freq: Every evening | ORAL | 0 refills | Status: DC | PRN

## 2020-05-06 MED ORDER — ACETAMINOPHEN 325 MG PO TABS
650.0000 mg | ORAL_TABLET | Freq: Once | ORAL | Status: AC
  Administered 2020-05-06: 650 mg via ORAL
  Filled 2020-05-06: qty 2

## 2020-05-06 MED ORDER — FENTANYL CITRATE (PF) 100 MCG/2ML IJ SOLN
50.0000 ug | Freq: Once | INTRAMUSCULAR | Status: AC
  Administered 2020-05-06: 50 ug via INTRAVENOUS
  Filled 2020-05-06: qty 2

## 2020-05-06 MED ORDER — METHOCARBAMOL 500 MG PO TABS
1000.0000 mg | ORAL_TABLET | Freq: Once | ORAL | Status: AC
  Administered 2020-05-06: 1000 mg via ORAL
  Filled 2020-05-06: qty 2

## 2020-05-06 NOTE — ED Notes (Signed)
Patient given food and drink. Patient resting with family at bedside.
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
## 2020-05-06 NOTE — Discharge Instructions (Addendum)
Please follow with your primary care provider to discuss your incidental findings on your CT imaging included your arthritis in your neck, lung nodules, and the fluid in your ear. Apply ice to your areas of pain for 20 minutes at a time. You can take tylenol every 6 hours as needed for pain. You can take flexeril once daily at bedtime needed for muscle spasm. Be aware this medication can make you drowsy. Schedule an appointment with your primary care provider to follow up on your visit today. Return to the ER for severely worsening headache, vision changes, if new numbness or weakness in your arms or legs, inability to urinate, inability to hold your bowels, or concerning symptoms.

## 2020-05-06 NOTE — ED Provider Notes (Signed)
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 chronic microvascular ischemic disease. No evidence of large-territorial acute infarction. No parenchymal hemorrhage. No mass lesion. No extra-axial collection. No mass effect or midline shift. No hydrocephalus. Basilar cisterns are patent. Vascular: No hyperdense vessel. Atherosclerotic calcifications are present within  the cavernous internal carotid arteries. Skull: No acute fracture or focal lesion. Sinuses/Orbits: Fluid within the right middle ear. Right mastoid air cell fluid. Otherwise remain paranasal sinuses and mastoid air cells are clear. The orbits are unremarkable. Other: None. CT CERVICAL FINDINGS Alignment: Normal. Skull base and vertebrae: Multilevel at least moderate degenerative changes of the spine in a patient with C5-C6 anterior fusion. No surgical hardware complication identified. 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 No severe osseous central canal stenosis. No acute fracture. No aggressive appearing focal osseous lesion or focal pathologic process. Soft tissues and spinal canal: No prevertebral fluid or swelling. No visible canal hematoma. Upper chest: Unremarkable. Other: Atherosclerotic plaque of the carotid arteries within the neck at  least mild. CT CHEST FINDINGS Ports and Devices: None. Lungs/airways: Scattered centrilobular tree-in-bud nodularity within the lungs that are most pronounced in bilateral lower lobes. No focal consolidation. No pulmonary nodule. No pulmonary mass. No pulmonary contusion or laceration. No pneumatocele formation. The central airways are patent. Pleura: No pleural effusion. No pneumothorax. No hemothorax. Lymph Nodes: No mediastinal, hilar, or axillary lymphadenopathy. Mediastinum: No pneumomediastinum. No aortic injury or mediastinal hematoma. The thoracic aorta is normal in caliber. Mild atherosclerotic plaque of the aorta. The heart is normal in size. At least 3 vessel coronary artery calcifications. No significant pericardial effusion. The esophagus is unremarkable. The thyroid is unremarkable. Chest Wall / Breasts: No chest wall mass. 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 Otherwise no suspicious lytic or blastic osseous lesions.No acute rib or sternal fracture. No acute spinal fracture. CT ABDOMEN AND PELVIS FINDINGS Liver: Not enlarged. No focal lesion. No laceration or subcapsular hematoma. Biliary System: Status post cholecystectomy. No biliary ductal dilatation. Pancreas: Normal pancreatic contour. No main pancreatic duct dilatation. Spleen: Not enlarged. No focal lesion. No laceration, subcapsular hematoma, or vascular injury. Adrenal Glands: No nodularity bilaterally. Kidneys: Bilateral atrophic and scarred kidneys. Multiple fluid density lesions likely represent simple renal cysts. Subcentimeter hypodensities are too small to characterize. Coarse calcifications noted within bilateral kidneys measuring up to 9 mm. No hydronephrosis. No contusion, laceration, or subcapsular hematoma. No injury to the vascular structures or collecting systems. No hydroureter. The urinary bladder is fully decompressed and grossly unremarkable.  Bowel: No small or large bowel wall thickening or dilatation. Underdistended rectum. The appendix is unremarkable. Mesentery, Omentum, and Peritoneum: No simple free fluid ascites. No pneumoperitoneum. No hemoperitoneum. No mesenteric hematoma identified. No organized fluid collection. Pelvic Organs: Prominent prostate. Lymph Nodes: No abdominal, pelvic, inguinal lymphadenopathy. Vasculature: No abdominal aorta or iliac aneurysm. Similar origin of the celiac and superior mesenteric arteries. At least moderate atherosclerotic plaque. No active contrast extravasation or pseudoaneurysm. Musculoskeletal: No significant soft tissue hematoma. 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 Otherwise no suspicious lytic or blastic osseous lesions. No acute pelvic fracture. No acute spinal fracture. IMPRESSION: 1. No acute intracranial abnormality. 2. 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 3. No acute traumatic injury to the chest, abdomen, or pelvis. 4. No acute fracture or traumatic malalignment of the thoracic or lumbar spine. 5. Right mastoid air cell effusion with associated right middle ear fluid. No associated skull base fracture. Correlate with physical exam/clinical history for infection. 6. Pulmonary nodular findings suggestive of infection/inflammation. No follow-up needed if patient is low-risk (and  has no known or suspected primary neoplasm). Non-contrast chest CT can be considered in 12 months if patient is high-risk. This recommendation follows the consensus statement: Guidelines for Management of Incidental Pulmonary Nodules Detected on CT Images: From the Fleischner Society 2017; Radiology 2017; 284:228-243. 7. Other imaging findings of potential clinical significance: Bilateral renal cortical scarring, atrophy, and  delayed enhancement with bilateral nonobstructive nephrolithiasis. Prominent prostate. Electronically Signed   By: Iven Finn M.D.   On: 05/06/2020 18:50    MDM  No acute injuries noted on imaging.  Patient reports chronic right hearing deficit and chronic right ear effusion.  He is followed by Dr. Milford Cage.  Recommend he follow for this, exam is not consistent with bacterial otitis media.  Discussed additional incidental CT findings.  Regarding symptom management, recommend muscle relaxer as suggested per previous provider.  Small dose at bedtime.  Continue his tramadol as needed.  Close outpatient follow-up.       Denee Boeder, [place] N, PA-C 05/06/20 2004    Lorelle Gibbs, DO 05/06/20 2302

## 2020-05-09 DIAGNOSIS — D631 Anemia in chronic kidney disease: Secondary | ICD-10-CM | POA: Diagnosis not present

## 2020-05-09 DIAGNOSIS — N186 End stage renal disease: Secondary | ICD-10-CM | POA: Diagnosis not present

## 2020-05-09 DIAGNOSIS — Z992 Dependence on renal dialysis: Secondary | ICD-10-CM | POA: Diagnosis not present

## 2020-05-09 DIAGNOSIS — N2581 Secondary hyperparathyroidism of renal origin: Secondary | ICD-10-CM | POA: Diagnosis not present

## 2020-05-11 DIAGNOSIS — N186 End stage renal disease: Secondary | ICD-10-CM | POA: Diagnosis not present

## 2020-05-11 DIAGNOSIS — Z992 Dependence on renal dialysis: Secondary | ICD-10-CM | POA: Diagnosis not present

## 2020-05-11 DIAGNOSIS — D631 Anemia in chronic kidney disease: Secondary | ICD-10-CM | POA: Diagnosis not present

## 2020-05-11 DIAGNOSIS — N2581 Secondary hyperparathyroidism of renal origin: Secondary | ICD-10-CM | POA: Diagnosis not present

## 2020-05-13 ENCOUNTER — Other Ambulatory Visit: Payer: Self-pay

## 2020-05-13 ENCOUNTER — Encounter: Payer: Self-pay | Admitting: Family

## 2020-05-13 ENCOUNTER — Ambulatory Visit (INDEPENDENT_AMBULATORY_CARE_PROVIDER_SITE_OTHER): Payer: Medicare Other | Admitting: Family

## 2020-05-13 VITALS — BP 130/82 | HR 70 | Temp 97.7°F | Resp 18 | Ht 67.0 in | Wt 203.0 lb

## 2020-05-13 DIAGNOSIS — M545 Low back pain, unspecified: Secondary | ICD-10-CM | POA: Diagnosis not present

## 2020-05-13 DIAGNOSIS — M170 Bilateral primary osteoarthritis of knee: Secondary | ICD-10-CM

## 2020-05-13 DIAGNOSIS — R200 Anesthesia of skin: Secondary | ICD-10-CM | POA: Diagnosis not present

## 2020-05-13 DIAGNOSIS — D631 Anemia in chronic kidney disease: Secondary | ICD-10-CM | POA: Diagnosis not present

## 2020-05-13 DIAGNOSIS — Z992 Dependence on renal dialysis: Secondary | ICD-10-CM | POA: Diagnosis not present

## 2020-05-13 DIAGNOSIS — M542 Cervicalgia: Secondary | ICD-10-CM | POA: Diagnosis not present

## 2020-05-13 DIAGNOSIS — N186 End stage renal disease: Secondary | ICD-10-CM | POA: Diagnosis not present

## 2020-05-13 DIAGNOSIS — R202 Paresthesia of skin: Secondary | ICD-10-CM

## 2020-05-13 DIAGNOSIS — N2581 Secondary hyperparathyroidism of renal origin: Secondary | ICD-10-CM | POA: Diagnosis not present

## 2020-05-13 MED ORDER — CYCLOBENZAPRINE HCL 5 MG PO TABS: 5.0000 mg | ORAL_TABLET | Freq: Every evening | ORAL | 1 refills | Status: DC | PRN

## 2020-05-13 MED ORDER — TRAMADOL HCL 50 MG PO TABS: 50.0000 mg | ORAL_TABLET | Freq: Every day | ORAL | 0 refills | Status: DC | PRN
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
## 2020-05-13 NOTE — Patient Instructions (Addendum)
Referral ordered today to [place] office will call you for appointment

## 2020-05-16 DIAGNOSIS — N2581 Secondary hyperparathyroidism of renal origin: Secondary | ICD-10-CM | POA: Diagnosis not present

## 2020-05-16 DIAGNOSIS — N186 End stage renal disease: Secondary | ICD-10-CM | POA: Diagnosis not present

## 2020-05-16 DIAGNOSIS — D631 Anemia in chronic kidney disease: Secondary | ICD-10-CM | POA: Diagnosis not present

## 2020-05-16 DIAGNOSIS — Z992 Dependence on renal dialysis: Secondary | ICD-10-CM | POA: Diagnosis not present

## 2020-05-18 DIAGNOSIS — N2581 Secondary hyperparathyroidism of renal origin: Secondary | ICD-10-CM | POA: Diagnosis not present

## 2020-05-18 DIAGNOSIS — N186 End stage renal disease: Secondary | ICD-10-CM | POA: Diagnosis not present

## 2020-05-18 DIAGNOSIS — Z992 Dependence on renal dialysis: Secondary | ICD-10-CM | POA: Diagnosis not present

## 2020-05-18 DIAGNOSIS — D631 Anemia in chronic kidney disease: Secondary | ICD-10-CM | POA: Diagnosis not present

## 2020-05-19 ENCOUNTER — Telehealth: Payer: Self-pay

## 2020-05-19 DIAGNOSIS — R202 Paresthesia of skin: Secondary | ICD-10-CM

## 2020-05-19 DIAGNOSIS — M545 Low back pain, unspecified: Secondary | ICD-10-CM

## 2020-05-19 DIAGNOSIS — M542 Cervicalgia: Secondary | ICD-10-CM

## 2020-05-19 DIAGNOSIS — R200 Anesthesia of skin: Secondary | ICD-10-CM

## 2020-05-19 NOTE — Telephone Encounter (Signed)
Done, faxed referral over to [facility] on [address] [phone]  Thanks, Vilinda Blanks

## 2020-05-19 NOTE — Telephone Encounter (Signed)
Neurosurgey may be a good option since he is having pain, if he is agreeable to that we can make a different referral
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
## 2020-05-19 NOTE — Telephone Encounter (Signed)
Patient's wife call stating that the neurologist Dr. Edwena Felty office sent her a text about appointment to see husband today, but since she didn't receive a call instead they couldn't make the appointment. She called to reschedule and they are not able to see him until June. She states patient is getting worse and really needs to see someone. She would like to know if there is someone else he can see. Please advise.  Message routed to Sherrie Mustache, NP(who is covering Marlowe Sax, NP in basket) and Kathyrn Lass [facility] referral coordinator.

## 2020-05-19 NOTE — Telephone Encounter (Signed)
Called and let Mrs. Fiorentino know that referral was placed.

## 2020-05-19 NOTE — Telephone Encounter (Signed)
Patient is in agreement with trying to get referral to neurosurgery.   Message routed back Sherrie Mustache, NP and Kathyrn Lass Referral coordinator

## 2020-05-20 DIAGNOSIS — N186 End stage renal disease: Secondary | ICD-10-CM | POA: Diagnosis not present

## 2020-05-20 DIAGNOSIS — D631 Anemia in chronic kidney disease: Secondary | ICD-10-CM | POA: Diagnosis not present

## 2020-05-20 DIAGNOSIS — N2581 Secondary hyperparathyroidism of renal origin: Secondary | ICD-10-CM | POA: Diagnosis not present

## 2020-05-20 DIAGNOSIS — Z992 Dependence on renal dialysis: Secondary | ICD-10-CM | POA: Diagnosis not present

## 2020-05-22 DIAGNOSIS — Z992 Dependence on renal dialysis: Secondary | ICD-10-CM | POA: Diagnosis not present

## 2020-05-22 DIAGNOSIS — T8612 Kidney transplant failure: Secondary | ICD-10-CM | POA: Diagnosis not present

## 2020-05-22 DIAGNOSIS — N186 End stage renal disease: Secondary | ICD-10-CM | POA: Diagnosis not present

## 2020-05-23 DIAGNOSIS — N186 End stage renal disease: Secondary | ICD-10-CM | POA: Diagnosis not present

## 2020-05-23 DIAGNOSIS — Z992 Dependence on renal dialysis: Secondary | ICD-10-CM | POA: Diagnosis not present

## 2020-05-23 DIAGNOSIS — N2581 Secondary hyperparathyroidism of renal origin: Secondary | ICD-10-CM | POA: Diagnosis not present

## 2020-05-23 DIAGNOSIS — D631 Anemia in chronic kidney disease: Secondary | ICD-10-CM | POA: Diagnosis not present

## 2020-05-23 DIAGNOSIS — M4712 Other spondylosis with myelopathy, cervical region: Secondary | ICD-10-CM | POA: Diagnosis not present
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
## 2020-05-24 ENCOUNTER — Other Ambulatory Visit: Payer: Self-pay

## 2020-05-24 ENCOUNTER — Encounter: Payer: Self-pay | Admitting: Primary Care

## 2020-05-24 ENCOUNTER — Ambulatory Visit (INDEPENDENT_AMBULATORY_CARE_PROVIDER_SITE_OTHER): Payer: Medicare Other | Admitting: Primary Care

## 2020-05-24 DIAGNOSIS — F17210 Nicotine dependence, cigarettes, uncomplicated: Secondary | ICD-10-CM | POA: Diagnosis not present

## 2020-05-24 DIAGNOSIS — J4530 Mild persistent asthma, uncomplicated: Secondary | ICD-10-CM | POA: Diagnosis not present

## 2020-05-24 DIAGNOSIS — R9389 Abnormal findings on diagnostic imaging of other specified body structures: Secondary | ICD-10-CM

## 2020-05-24 DIAGNOSIS — Z9989 Dependence on other enabling machines and devices: Secondary | ICD-10-CM

## 2020-05-24 DIAGNOSIS — G4733 Obstructive sleep apnea (adult) (pediatric): Secondary | ICD-10-CM | POA: Diagnosis not present

## 2020-05-24 NOTE — Assessment & Plan Note (Addendum)
-   Stable interval. No recent asthma exacerbations or active symptoms. Rare SABA use.  - Continue Symbicort 160 two puffs twice daily and prn albuterol q 6 hours for breakthrough symptoms

## 2020-05-24 NOTE — Assessment & Plan Note (Signed)
-   Inconsistent CPAP use, no download available. OSA managed by Dr. Brett Fairy - Encourage patient wear CPAP every night for min 4-6 hours or longer  - Advised not to drive if experiencing excessive daytime sleepiness

## 2020-05-24 NOTE — Assessment & Plan Note (Addendum)
-   CT chest 05/06/20 showed scattered centrilobular tree-in-bud nodularity most pronounced in bilateral lower lobes. No pulmonary nodules or mass. He had similar cluster micro nodules to lower lobes on CTA imaging back in 2020. He has no signs of active infection, he is a former smoker quit 1-2 months ago. Denies cough, mucus production or hemoptysis. Needs repeat CT chest in 12 months to monitor for stability d/t smoking hx.

## 2020-05-24 NOTE — Assessment & Plan Note (Addendum)
-   Quit smoking March-April 2022; continue to encourage abstinence

## 2020-05-24 NOTE — Patient Instructions (Addendum)
CT chest showed scattered centrilobular tree-in-bud nodularity most pronounced in bilateral lower lobes. No pulmonary nodules or mass. You had clustered micro nodules on CT chest in 2020 in the same area. This is likely from smoking history, if you develop cough with purulent mucus or blood in sputum let office know. We will repeat CT chest in 6-12 months.   Asthma: - Continue Symbicort two puffs morning and evening - Use albuterol 2 puffs every 6 hours for wheezing/shortness of breath   Sleep apnea: - Resume CPAP use, aim to wear minimum 4-6 hours a night - Follow-up with Dr. Comer Locket  Hearing loss: - Follow up with Dr. Lucia Gaskins   Follow-up - Keep apt in July with Dr. Melvyn Novas

## 2020-05-25 ENCOUNTER — Ambulatory Visit: Payer: Self-pay | Admitting: Family

## 2020-05-25 DIAGNOSIS — N186 End stage renal disease: Secondary | ICD-10-CM | POA: Diagnosis not present

## 2020-05-25 DIAGNOSIS — N2581 Secondary hyperparathyroidism of renal origin: Secondary | ICD-10-CM | POA: Diagnosis not present

## 2020-05-25 DIAGNOSIS — D631 Anemia in chronic kidney disease: Secondary | ICD-10-CM | POA: Diagnosis not present

## 2020-05-25 DIAGNOSIS — Z992 Dependence on renal dialysis: Secondary | ICD-10-CM | POA: Diagnosis not present

## 2020-05-27 DIAGNOSIS — N2581 Secondary hyperparathyroidism of renal origin: Secondary | ICD-10-CM | POA: Diagnosis not present

## 2020-05-27 DIAGNOSIS — D631 Anemia in chronic kidney disease: Secondary | ICD-10-CM | POA: Diagnosis not present

## 2020-05-27 DIAGNOSIS — Z992 Dependence on renal dialysis: Secondary | ICD-10-CM | POA: Diagnosis not present

## 2020-05-27 DIAGNOSIS — N186 End stage renal disease: Secondary | ICD-10-CM | POA: Diagnosis not present

## 2020-05-30 DIAGNOSIS — D631 Anemia in chronic kidney disease: Secondary | ICD-10-CM | POA: Diagnosis not present

## 2020-05-30 DIAGNOSIS — N2581 Secondary hyperparathyroidism of renal origin: Secondary | ICD-10-CM | POA: Diagnosis not present

## 2020-05-30 DIAGNOSIS — Z992 Dependence on renal dialysis: Secondary | ICD-10-CM | POA: Diagnosis not present

## 2020-05-30 DIAGNOSIS — N186 End stage renal disease: Secondary | ICD-10-CM | POA: Diagnosis not present

## 2020-05-31 ENCOUNTER — Ambulatory Visit: Payer: Self-pay

## 2020-05-31 ENCOUNTER — Encounter: Payer: Self-pay | Admitting: Orthopaedic Surgery

## 2020-05-31 ENCOUNTER — Ambulatory Visit (INDEPENDENT_AMBULATORY_CARE_PROVIDER_SITE_OTHER): Payer: Medicare Other | Admitting: Orthopaedic Surgery

## 2020-05-31 ENCOUNTER — Ambulatory Visit (INDEPENDENT_AMBULATORY_CARE_PROVIDER_SITE_OTHER): Payer: Medicare Other

## 2020-05-31 VITALS — Ht 67.0 in | Wt 213.0 lb

## 2020-05-31 DIAGNOSIS — M25562 Pain in left knee: Secondary | ICD-10-CM

## 2020-05-31 DIAGNOSIS — M25561 Pain in right knee: Secondary | ICD-10-CM | POA: Diagnosis not present

## 2020-05-31 MED ORDER — PREDNISONE 10 MG (21) PO TBPK: ORAL_TABLET | ORAL | 0 refills | Status: DC
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
## 2020-06-01 ENCOUNTER — Other Ambulatory Visit: Payer: Self-pay | Admitting: Family

## 2020-06-01 DIAGNOSIS — F419 Anxiety disorder, unspecified: Secondary | ICD-10-CM

## 2020-06-01 DIAGNOSIS — N186 End stage renal disease: Secondary | ICD-10-CM | POA: Diagnosis not present

## 2020-06-01 DIAGNOSIS — D631 Anemia in chronic kidney disease: Secondary | ICD-10-CM | POA: Diagnosis not present

## 2020-06-01 DIAGNOSIS — Z992 Dependence on renal dialysis: Secondary | ICD-10-CM | POA: Diagnosis not present

## 2020-06-01 DIAGNOSIS — N2581 Secondary hyperparathyroidism of renal origin: Secondary | ICD-10-CM | POA: Diagnosis not present

## 2020-06-02 DIAGNOSIS — M4712 Other spondylosis with myelopathy, cervical region: Secondary | ICD-10-CM | POA: Diagnosis not present

## 2020-06-02 DIAGNOSIS — M542 Cervicalgia: Secondary | ICD-10-CM | POA: Diagnosis not present

## 2020-06-02 NOTE — Telephone Encounter (Signed)
Patient has request refill on medication "Clonazepam 0.5mg ". Patient last refill was 04/28/2020 with 60 tablets to be taken two times daily as needed. Patient is due for refill. Medication pend and sent to Sherrie Mustache, NP due to PCP [place], Nelda Bucks, NP being out of office. Please Advise.

## 2020-06-03 DIAGNOSIS — N2581 Secondary hyperparathyroidism of renal origin: Secondary | ICD-10-CM | POA: Diagnosis not present

## 2020-06-03 DIAGNOSIS — Z992 Dependence on renal dialysis: Secondary | ICD-10-CM | POA: Diagnosis not present

## 2020-06-03 DIAGNOSIS — D631 Anemia in chronic kidney disease: Secondary | ICD-10-CM | POA: Diagnosis not present

## 2020-06-03 DIAGNOSIS — N186 End stage renal disease: Secondary | ICD-10-CM | POA: Diagnosis not present

## 2020-06-06 DIAGNOSIS — Z992 Dependence on renal dialysis: Secondary | ICD-10-CM | POA: Diagnosis not present

## 2020-06-06 DIAGNOSIS — N186 End stage renal disease: Secondary | ICD-10-CM | POA: Diagnosis not present

## 2020-06-06 DIAGNOSIS — N2581 Secondary hyperparathyroidism of renal origin: Secondary | ICD-10-CM | POA: Diagnosis not present

## 2020-06-06 DIAGNOSIS — D631 Anemia in chronic kidney disease: Secondary | ICD-10-CM | POA: Diagnosis not present

## 2020-06-08 DIAGNOSIS — Z992 Dependence on renal dialysis: Secondary | ICD-10-CM | POA: Diagnosis not present

## 2020-06-08 DIAGNOSIS — N186 End stage renal disease: Secondary | ICD-10-CM | POA: Diagnosis not present

## 2020-06-08 DIAGNOSIS — N2581 Secondary hyperparathyroidism of renal origin: Secondary | ICD-10-CM | POA: Diagnosis not present

## 2020-06-08 DIAGNOSIS — D631 Anemia in chronic kidney disease: Secondary | ICD-10-CM | POA: Diagnosis not present

## 2020-06-10 ENCOUNTER — Ambulatory Visit (INDEPENDENT_AMBULATORY_CARE_PROVIDER_SITE_OTHER): Payer: Medicare Other | Admitting: Adult Health

## 2020-06-10 ENCOUNTER — Encounter: Payer: Self-pay | Admitting: Adult Health

## 2020-06-10 ENCOUNTER — Other Ambulatory Visit: Payer: Self-pay

## 2020-06-10 VITALS — BP 100/70 | HR 67 | Temp 97.3°F | Ht 67.0 in | Wt 206.2 lb

## 2020-06-10 DIAGNOSIS — M5412 Radiculopathy, cervical region: Secondary | ICD-10-CM | POA: Diagnosis not present

## 2020-06-10 DIAGNOSIS — R195 Other fecal abnormalities: Secondary | ICD-10-CM | POA: Diagnosis not present

## 2020-06-10 DIAGNOSIS — N186 End stage renal disease: Secondary | ICD-10-CM | POA: Diagnosis not present

## 2020-06-10 DIAGNOSIS — D631 Anemia in chronic kidney disease: Secondary | ICD-10-CM | POA: Diagnosis not present

## 2020-06-10 DIAGNOSIS — N2581 Secondary hyperparathyroidism of renal origin: Secondary | ICD-10-CM | POA: Diagnosis not present

## 2020-06-10 DIAGNOSIS — Z992 Dependence on renal dialysis: Secondary | ICD-10-CM | POA: Diagnosis not present

## 2020-06-10 MED ORDER — GABAPENTIN 100 MG PO CAPS: 100.0000 mg | ORAL_CAPSULE | Freq: Two times a day (BID) | ORAL | 3 refills | Status: DC

## 2020-06-10 NOTE — Patient Instructions (Signed)
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
## 2020-06-13 DIAGNOSIS — Z992 Dependence on renal dialysis: Secondary | ICD-10-CM | POA: Diagnosis not present

## 2020-06-13 DIAGNOSIS — D631 Anemia in chronic kidney disease: Secondary | ICD-10-CM | POA: Diagnosis not present

## 2020-06-13 DIAGNOSIS — N2581 Secondary hyperparathyroidism of renal origin: Secondary | ICD-10-CM | POA: Diagnosis not present

## 2020-06-13 DIAGNOSIS — N186 End stage renal disease: Secondary | ICD-10-CM | POA: Diagnosis not present

## 2020-06-15 ENCOUNTER — Other Ambulatory Visit: Payer: Self-pay | Admitting: Family

## 2020-06-15 DIAGNOSIS — M545 Low back pain, unspecified: Secondary | ICD-10-CM

## 2020-06-15 DIAGNOSIS — N186 End stage renal disease: Secondary | ICD-10-CM | POA: Diagnosis not present

## 2020-06-15 DIAGNOSIS — Z992 Dependence on renal dialysis: Secondary | ICD-10-CM | POA: Diagnosis not present

## 2020-06-15 DIAGNOSIS — M542 Cervicalgia: Secondary | ICD-10-CM

## 2020-06-15 DIAGNOSIS — D631 Anemia in chronic kidney disease: Secondary | ICD-10-CM | POA: Diagnosis not present

## 2020-06-15 DIAGNOSIS — N2581 Secondary hyperparathyroidism of renal origin: Secondary | ICD-10-CM | POA: Diagnosis not present

## 2020-06-15 DIAGNOSIS — M170 Bilateral primary osteoarthritis of knee: Secondary | ICD-10-CM

## 2020-06-15 NOTE — Telephone Encounter (Signed)
Pharmacy requested refill Epic LR: 05/13/2020 Contract on [employer] [facility] and sent to [facility] for approval ([place] out of office)

## 2020-06-17 DIAGNOSIS — D631 Anemia in chronic kidney disease: Secondary | ICD-10-CM | POA: Diagnosis not present

## 2020-06-17 DIAGNOSIS — Z992 Dependence on renal dialysis: Secondary | ICD-10-CM | POA: Diagnosis not present

## 2020-06-17 DIAGNOSIS — N186 End stage renal disease: Secondary | ICD-10-CM | POA: Diagnosis not present

## 2020-06-17 DIAGNOSIS — N2581 Secondary hyperparathyroidism of renal origin: Secondary | ICD-10-CM | POA: Diagnosis not present

## 2020-06-20 DIAGNOSIS — Z992 Dependence on renal dialysis: Secondary | ICD-10-CM | POA: Diagnosis not present

## 2020-06-20 DIAGNOSIS — N2581 Secondary hyperparathyroidism of renal origin: Secondary | ICD-10-CM | POA: Diagnosis not present

## 2020-06-20 DIAGNOSIS — N186 End stage renal disease: Secondary | ICD-10-CM | POA: Diagnosis not present

## 2020-06-20 DIAGNOSIS — D631 Anemia in chronic kidney disease: Secondary | ICD-10-CM | POA: Diagnosis not present

## 2020-06-21 ENCOUNTER — Ambulatory Visit: Payer: Self-pay

## 2020-06-21 ENCOUNTER — Other Ambulatory Visit: Payer: Self-pay

## 2020-06-21 ENCOUNTER — Ambulatory Visit (INDEPENDENT_AMBULATORY_CARE_PROVIDER_SITE_OTHER): Payer: Medicare Other

## 2020-06-21 ENCOUNTER — Encounter: Payer: Self-pay | Admitting: Orthopaedic Surgery

## 2020-06-21 ENCOUNTER — Ambulatory Visit (INDEPENDENT_AMBULATORY_CARE_PROVIDER_SITE_OTHER): Payer: Medicare Other | Admitting: Orthopaedic Surgery

## 2020-06-21 VITALS — Ht 67.0 in | Wt 206.0 lb

## 2020-06-21 DIAGNOSIS — R55 Syncope and collapse: Secondary | ICD-10-CM | POA: Diagnosis not present

## 2020-06-21 DIAGNOSIS — M4712 Other spondylosis with myelopathy, cervical region: Secondary | ICD-10-CM | POA: Diagnosis not present

## 2020-06-21 DIAGNOSIS — Z6832 Body mass index (BMI) 32.0-32.9, adult: Secondary | ICD-10-CM | POA: Diagnosis not present

## 2020-06-21 DIAGNOSIS — M25511 Pain in right shoulder: Secondary | ICD-10-CM | POA: Diagnosis not present

## 2020-06-21 DIAGNOSIS — M25551 Pain in right hip: Secondary | ICD-10-CM
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
## 2020-06-21 NOTE — Progress Notes (Signed)
Subjective: Patient is here for ultrasound-guided intra-articular right hip injection.     Objective:  Pain with IR.  Procedure: Ultrasound guided injection is preferred based studies that show increased duration, increased effect, greater accuracy, decreased procedural pain, increased response rate, and decreased cost with ultrasound guided versus blind injection.   Verbal informed consent obtained.  Time-out conducted.  Noted no overlying erythema, induration, or other signs of local infection. Ultrasound-guided right hip injection: After sterile prep with Betadine, injected 4 cc 0.25% bupivacaine without epinephrine and 6 mg betamethasone using a 22-gauge spinal needle, passing the needle through the iliofemoral ligament into the femoral head/neck junction.  Injectate seen filling joint capsule.

## 2020-06-22 DIAGNOSIS — F339 Major depressive disorder, recurrent, unspecified: Secondary | ICD-10-CM | POA: Diagnosis not present

## 2020-06-22 DIAGNOSIS — M5412 Radiculopathy, cervical region: Secondary | ICD-10-CM | POA: Diagnosis not present

## 2020-06-22 DIAGNOSIS — N186 End stage renal disease: Secondary | ICD-10-CM | POA: Diagnosis not present

## 2020-06-22 DIAGNOSIS — T8612 Kidney transplant failure: Secondary | ICD-10-CM | POA: Diagnosis not present

## 2020-06-22 DIAGNOSIS — D631 Anemia in chronic kidney disease: Secondary | ICD-10-CM | POA: Diagnosis not present

## 2020-06-22 DIAGNOSIS — Z992 Dependence on renal dialysis: Secondary | ICD-10-CM | POA: Diagnosis not present

## 2020-06-22 DIAGNOSIS — N2581 Secondary hyperparathyroidism of renal origin: Secondary | ICD-10-CM | POA: Diagnosis not present

## 2020-06-24 ENCOUNTER — Encounter: Payer: Self-pay | Admitting: Adult Health

## 2020-06-24 ENCOUNTER — Other Ambulatory Visit: Payer: Self-pay

## 2020-06-24 ENCOUNTER — Ambulatory Visit (INDEPENDENT_AMBULATORY_CARE_PROVIDER_SITE_OTHER): Payer: Medicare Other | Admitting: Adult Health

## 2020-06-24 VITALS — BP 128/88 | HR 71 | Temp 97.1°F | Resp 18 | Ht 67.0 in | Wt 211.6 lb

## 2020-06-24 DIAGNOSIS — M5412 Radiculopathy, cervical region: Secondary | ICD-10-CM | POA: Diagnosis not present

## 2020-06-24 DIAGNOSIS — F339 Major depressive disorder, recurrent, unspecified: Secondary | ICD-10-CM | POA: Diagnosis not present

## 2020-06-24 DIAGNOSIS — N2581 Secondary hyperparathyroidism of renal origin: Secondary | ICD-10-CM | POA: Diagnosis not present

## 2020-06-24 DIAGNOSIS — D631 Anemia in chronic kidney disease: Secondary | ICD-10-CM | POA: Diagnosis not present

## 2020-06-24 DIAGNOSIS — Z992 Dependence on renal dialysis: Secondary | ICD-10-CM | POA: Diagnosis not present

## 2020-06-24 DIAGNOSIS — N186 End stage renal disease: Secondary | ICD-10-CM | POA: Diagnosis not present

## 2020-06-24 MED ORDER — DULOXETINE HCL 30 MG PO CPEP: 30.0000 mg | ORAL_CAPSULE | Freq: Every day | ORAL | 3 refills | Status: DC

## 2020-06-24 MED ORDER — BUPROPION HCL ER (XL) 150 MG PO TB24
150.0000 mg | ORAL_TABLET | ORAL | 0 refills | Status: DC
Start: 2020-06-24 — End: 2020-09-06

## 2020-06-24 NOTE — Patient Instructions (Signed)

## 2020-06-24 NOTE — Progress Notes (Signed)
[facility] clinic  Provider:   Jaymes Graff Medina-Vargas  Code Status:  Full Code  Goals of Care:  Advanced Directives 06/24/2020  Does Patient Have a Medical Advance Directive? No  Does patient want to make changes to medical advance directive? No - Patient declined  Would patient like information on creating a medical advance directive? -  Pre-existing out of facility DNR order (yellow form or pink MOST form) -     Chief Complaint  Patient presents with  . Acute Visit    Patient wants to discuss neuro concerns.     HPI: Patient is a 60 y.o. male seen today for an acute visit for his neck pain. He is accompanied by his wife. He had just finished his hemodialysis for today and was noted to be sleepy. He would respond and open his eyes when asked questions. Wife thinks that patient's memory is getting worse. "He is having memory fog." He followed up with neurosurgery and orthopedics on 06/21/20 .He had cortisone injection for his right hip. He has neck pain, 7-8/10. He was recently started on Gabapentin 100 mg BID for his neck pain. He is currently taking Wellbutrin XL 150 mg daily for depression.   Past Medical History:  Diagnosis Date  . Acute edema of lung, unspecified   . Acute, but ill-defined, cerebrovascular disease   . Allergy   . Anemia   . 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)   . Anxiety   . Asthma   . Asthma    moderate persistent  . Carpal tunnel syndrome   . Cellulitis and abscess of trunk   . Cholelithiasis 07/13/2014  . Chronic headaches   . Debility, unspecified   . Dermatophytosis of the body   . Dysrhythmia    history of  . Edema   . End stage renal disease on dialysis [facility])    "MWF; [facility]" (10/21/2014)  . Essential hypertension, benign   . GERD (gastroesophageal reflux disease)   . Gout, unspecified   . HTN (hypertension)   . Hypertrophy of prostate without urinary obstruction and other lower urinary tract symptoms (LUTS)   .  Hypotension, unspecified   . Impotence of organic origin   . Insomnia, unspecified   . Kidney replaced by transplant   . Localization-related (focal) (partial) epilepsy and epileptic syndromes with complex partial seizures, without mention of intractable epilepsy    12-15-19- Wife states he has NEVER had a seizure   . Lumbago   . Memory loss   . OSA on CPAP   . Other and unspecified hyperlipidemia    controlled /managed per wife   . Other chronic nonalcoholic liver disease   . Other malaise and fatigue   . Other nonspecific abnormal serum enzyme levels   . Pain in joint, lower leg   . Pain in joint, upper arm   . Pneumonia "several times"  . Renal dialysis status(V45.11) 02/05/2010   restarted 01/02/13 ofter renal trransplant failure  . Secondary hyperparathyroidism (of renal origin)   . Shortness of breath   . Sleep apnea    wears cpap   . Tension headache   . Unspecified constipation   . Unspecified essential hypertension   . Unspecified hereditary and idiopathic peripheral neuropathy   . Unspecified vitamin D deficiency     Past Surgical History:  Procedure Laterality Date  . AV FISTULA PLACEMENT Left ?2010   "forearm; at [facility]"  . BACK SURGERY    . CARDIAC CATHETERIZATION  03/21/2011  . CHOLECYSTECTOMY N/A 10/21/2014   Procedure: LAPAROSCOPIC CHOLECYSTECTOMY WITH INTRAOPERATIVE CHOLANGIOGRAM;  Surgeon: Autumn Messing III, MD;  Location: [facility];  Service: General;  Laterality: N/A;  . COLONOSCOPY    . 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 KIDNEY TRANSPLANT  08/17/2011   [place]  10/21/2014   w/IOC  . LEFT HEART CATHETERIZATION WITH CORONARY ANGIOGRAM N/A 03/21/2011   Procedure: LEFT HEART CATHETERIZATION WITH CORONARY ANGIOGRAM;  Surgeon: [name], MD;  Location: [facility] CATH LAB;  Service: Cardiovascular;  Laterality: N/A;  . NEPHRECTOMY  08/2013   removed transplaned kidney  . POLYPECTOMY    .  POSTERIOR FUSION CERVICAL SPINE  06/25/2012   for spinal stenosis  . VASECTOMY  2010    Allergies  Allergen Reactions  . Codeine Nausea And Vomiting    Outpatient Encounter Medications as of 06/24/2020  Medication Sig  . acetaminophen (TYLENOL) 500 MG tablet Take 1 tablet (500 mg total) by mouth every 6 (six) hours as needed for moderate pain.  Marland Kitchen albuterol (PROVENTIL HFA;VENTOLIN HFA) 108 (90 Base) MCG/ACT inhaler Inhale 2 puffs into the lungs every 6 (six) hours as needed for wheezing or shortness of breath.  Marland Kitchen amLODipine (NORVASC) 10 MG tablet TAKE 1 TABLET BY MOUTH EVERY DAY  . aspirin EC 81 MG tablet Take 81 mg by mouth daily.  Marland Kitchen b complex-vitamin c-folic acid (NEPHRO-VITE) 0.8 MG TABS tablet TAKE 1 TABLET BY MOUTH EVERY DAY  . budesonide-formoterol (SYMBICORT) 160-4.5 MCG/ACT inhaler Inhale 2 puffs into the lungs 2 (two) times daily.  Marland Kitchen buPROPion (WELLBUTRIN XL) 150 MG 24 hr tablet TAKE 1 TABLET BY MOUTH EVERY DAY  . calcitRIOL (ROCALTROL) 0.5 MCG capsule Take 2 capsules (1 mcg total) by mouth every Monday, Wednesday, and Friday with hemodialysis.  . chlorhexidine (PERIDEX) 0.12 % solution SMARTSIG:By Mouth  . Cholecalciferol (VITAMIN D3) 25 MCG (1000 UT) CAPS Take 4,000 Units by mouth daily.  . cinacalcet (SENSIPAR) 90 MG tablet Take 90 mg by mouth every evening.  . clonazePAM (KLONOPIN) 0.5 MG tablet TAKE 1 TABLET BY MOUTH 2 TIMES DAILY AS NEEDED FOR ANXIETY.  . cloNIDine (CATAPRES) 0.1 MG tablet Take 0.1 mg by mouth at bedtime.  . cyclobenzaprine (FLEXERIL) 5 MG tablet Take 1 tablet (5 mg total) by mouth at bedtime as needed for muscle spasms.  . diphenhydrAMINE (BENADRYL) 25 MG tablet Take 50 mg by mouth at bedtime.  Marland Kitchen doxazosin (CARDURA) 4 MG tablet Take 4 mg by mouth daily.  . famotidine (PEPCID) 20 MG tablet Take 1 tablet (20 mg total) by mouth at bedtime.  . gabapentin (NEURONTIN) 100 MG capsule Take 1 capsule (100 mg total) by mouth 2 (two) times daily.  Marland Kitchen guaiFENesin  (MUCINEX) 600 MG 12 hr tablet Take 1,200 mg by mouth 2 (two) times daily.  . hydrALAZINE (APRESOLINE) 100 MG tablet Take 1 tablet (100 mg total) by mouth in the morning and at bedtime.  Marland Kitchen ipratropium-albuterol (DUONEB) 0.5-2.5 (3) MG/3ML SOLN Take 3 mLs by nebulization every 4 (four) hours as needed (Shortness of breath).  . isosorbide mononitrate (IMDUR) 30 MG 24 hr tablet TAKE 1 TABLET DAILY  . lidocaine-prilocaine (EMLA) cream Apply 1 application topically See admin instructions. Apply 1 to 2 hours prior to dialysis on Mondays, Wednesdays, and Fridays. Cover with saran wrap.  . metoprolol tartrate (LOPRESSOR) 100 MG tablet TAKE 1 TABLET BY MOUTH TWICE A DAY  . omeprazole (PRILOSEC) 20 MG capsule  TAKE 1 CAPSULE TWICE DAILY BEFORE A MEAL  . ondansetron (ZOFRAN ODT) 4 MG disintegrating tablet Take 1 tablet (4 mg total) by mouth every 8 (eight) hours as needed for nausea or vomiting.  . pravastatin (PRAVACHOL) 40 MG tablet TAKE 1 TABLET BY MOUTH EVERY DAY IN THE EVENING  . PRESCRIPTION MEDICATION See admin instructions. CPAP- At bedtime and during any time of rest  . RENVELA 800 MG tablet Take 800-2,400 mg by mouth See admin instructions. Take 2,400 mg by mouth three times a day with meals and 800 mg with each snack  . traMADol (ULTRAM) 50 MG tablet TAKE 1 TABLET BY MOUTH DAILY AS NEEDED.   No facility-administered encounter medications on file as of 06/24/2020.    Review of Systems:  Review of Systems  Constitutional: Positive for appetite change. Negative for activity change and fever.  HENT: Negative for congestion.   Eyes: Negative for discharge.       Right ear poor hearing  Respiratory: Negative for chest tightness and shortness of breath.   Cardiovascular: Negative for leg swelling.  Gastrointestinal: Negative for abdominal pain and constipation.  Genitourinary:       On hemodialysis  Musculoskeletal: Positive for neck pain.  Skin: Negative for color change.    Health Maintenance   Topic Date Due  . Pneumococcal Vaccine 23-38 Years old (1 of 4 - PCV13) Never done  . Zoster Vaccines- Shingrix (1 of 2) Never done  . TETANUS/TDAP  01/23/2016  . HEMOGLOBIN A1C  04/20/2020  . COVID-19 Vaccine (4 - Booster for Moderna series) 07/01/2020  . INFLUENZA VACCINE  08/22/2020  . COLONOSCOPY (Pts 45-27yrs Insurance coverage will need to be confirmed)  12/29/2026  . PNEUMOCOCCAL POLYSACCHARIDE VACCINE AGE 86-64 HIGH RISK  Completed  . Hepatitis C Screening  Completed  . HIV Screening  Completed  . HPV VACCINES  Aged Out  . FOOT EXAM  Discontinued  . OPHTHALMOLOGY EXAM  Discontinued    Physical Exam: Vitals:   06/24/20 1323  BP: 128/88  Pulse: 71  Resp: 18  Temp: (!) 97.1 F (36.2 C)  SpO2: 97%  Weight: 211 lb 9.6 oz (96 kg)  Height: 5\' 7"  (1.702 m)   Body mass index is 33.14 kg/m. Physical Exam Constitutional:      Appearance: He is obese.  Cardiovascular:     Rate and Rhythm: Normal rate and regular rhythm.  Pulmonary:     Effort: Pulmonary effort is normal.     Breath sounds: Normal breath sounds.  Abdominal:     General: Bowel sounds are normal.     Palpations: Abdomen is soft.  Musculoskeletal:        General: Normal range of motion.     Cervical back: Normal range of motion.     Comments: Has left forearm AV fistula  Skin:    General: Skin is warm.  Neurological:     Mental Status: He is alert and oriented to person, place, and time. Mental status is at baseline.  Psychiatric:        Mood and Affect: Mood normal.        Behavior: Behavior normal.     Labs reviewed: Basic Metabolic Panel: Recent Labs    07/15/19 0704 03/03/20 0833 05/06/20 1516  NA 139  --  136  K 5.0  --  3.1*  CL 101  --  99  CO2 23  --  28  GLUCOSE 109*  --  106*  BUN 45*  --  18  CREATININE 13.39*  --  6.51*  CALCIUM 9.0  --  9.6  TSH  --  1.38  --    Liver Function Tests: Recent Labs    07/15/19 0704 05/06/20 1516  AST 15 34  ALT 15 30  ALKPHOS 57 72   BILITOT 1.1 0.9  PROT 6.7 7.7  ALBUMIN 3.6 3.9   CBC: Recent Labs    10/22/19 1602 03/03/20 0833 05/06/20 1516  WBC 4.3 4.8 4.5  NEUTROABS 2,829 2,928 2.7  HGB 10.9* 11.8* 12.6*  HCT 32.8* 35.7* 37.5*  MCV 97.6 100.6* 104.7*  PLT 74* 84* 85*   Lipid Panel: Recent Labs    10/22/19 1602 03/03/20 0833  CHOL 90 108  HDL 31* 29*  LDLCALC 44 52  TRIG 74 196*  CHOLHDL 2.9 3.7   Lab Results  Component Value Date   HGBA1C 5.0 10/22/2019    Procedures since last visit: US Guided Needle Placement - No Linked Charges  Result Date: 06/21/2020 Ultrasound guided injection is preferred based studies that show increased duration, increased effect, greater accuracy, decreased procedural pain, increased response rate, and decreased cost with ultrasound guided versus blind injection.   Verbal informed consent obtained.  Time-out conducted.  Noted no overlying erythema, induration, or other signs of local infection. Ultrasound-guided right hip injection: After sterile prep with Betadine, injected 4 cc 0.25% bupivacaine without epinephrine and 6 mg betamethasone using a 22-gauge spinal needle, passing the needle through the iliofemoral ligament into the femoral head/neck junction.  Injectate seen filling joint capsule.    XR KNEE 3 VIEW LEFT  Result Date: 05/31/2020 Joint space narrowing medial compartment  XR KNEE 3 VIEW RIGHT  Result Date: 05/31/2020 Joint space narrowing medial compartment  XR Lumbar Spine 2-3 Views  Result Date: 06/21/2020 Moderate degenerative changes L4-5   Assessment/Plan  1. Cervical radiculopathy -  Will taper off Wellbutrin XL X 1 week, then start Cymbalta for pain -  Continue Tramadol 50 mg daily PRN  -  Continue hard neck brace when out of bed - DULoxetine (CYMBALTA) 30 MG capsule; Take 1 capsule (30 mg total) by mouth daily.  Dispense: 30 capsule; Refill: 3  2. Major depression, recurrent, chronic (HCC) -  Will taper off Wellbutrin XL then start  Cymbalta. - buPROPion (WELLBUTRIN XL) 150 MG 24 hr tablet; Take 1 tablet (150 mg total) by mouth every Monday, Wednesday, and Friday for 7 days.  Dispense: 3 tablet; Refill: 0 - DULoxetine (CYMBALTA) 30 MG capsule; Take 1 capsule (30 mg total) by mouth daily.  Dispense: 30 capsule; Refill: 3    Labs/tests ordered: None  Next appt:  10/27/2020                                        ++++++++++++++++++++++++++++++++++++++++++++++++++++++++++++++++++++++++++++++++++++++++++++++++++++++++++++++++++++++++++++++++++++++++++++++++++++++++++++++++++++++++++++++++++++++++++++++++++++++++++++++++++

## 2020-06-27 DIAGNOSIS — Z992 Dependence on renal dialysis: Secondary | ICD-10-CM | POA: Diagnosis not present

## 2020-06-27 DIAGNOSIS — N186 End stage renal disease: Secondary | ICD-10-CM | POA: Diagnosis not present

## 2020-06-27 DIAGNOSIS — D631 Anemia in chronic kidney disease: Secondary | ICD-10-CM | POA: Diagnosis not present

## 2020-06-27 DIAGNOSIS — N2581 Secondary hyperparathyroidism of renal origin: Secondary | ICD-10-CM | POA: Diagnosis not present

## 2020-06-28 ENCOUNTER — Ambulatory Visit (INDEPENDENT_AMBULATORY_CARE_PROVIDER_SITE_OTHER): Payer: Medicare Other | Admitting: Otolaryngology

## 2020-06-28 ENCOUNTER — Other Ambulatory Visit: Payer: Self-pay

## 2020-06-28 ENCOUNTER — Institutional Professional Consult (permissible substitution): Payer: Medicare Other | Admitting: Neurology

## 2020-06-28 VITALS — Temp 97.3°F

## 2020-06-28 DIAGNOSIS — H90A22 Sensorineural hearing loss, unilateral, left ear, with restricted hearing on the contralateral side: Secondary | ICD-10-CM | POA: Diagnosis not present

## 2020-06-28 DIAGNOSIS — H90A31 Mixed conductive and sensorineural hearing loss, unilateral, right ear with restricted hearing on the contralateral side: Secondary | ICD-10-CM

## 2020-06-28 DIAGNOSIS — R195 Other fecal abnormalities: Secondary | ICD-10-CM | POA: Diagnosis not present

## 2020-06-28 DIAGNOSIS — H906 Mixed conductive and sensorineural hearing loss, bilateral: Secondary | ICD-10-CM | POA: Diagnosis not present
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
## 2020-06-29 ENCOUNTER — Other Ambulatory Visit: Payer: Self-pay | Admitting: Internal Medicine

## 2020-06-29 DIAGNOSIS — N2581 Secondary hyperparathyroidism of renal origin: Secondary | ICD-10-CM | POA: Diagnosis not present

## 2020-06-29 DIAGNOSIS — N186 End stage renal disease: Secondary | ICD-10-CM | POA: Diagnosis not present

## 2020-06-29 DIAGNOSIS — Z992 Dependence on renal dialysis: Secondary | ICD-10-CM | POA: Diagnosis not present

## 2020-06-29 DIAGNOSIS — I10 Essential (primary) hypertension: Secondary | ICD-10-CM

## 2020-06-29 DIAGNOSIS — D631 Anemia in chronic kidney disease: Secondary | ICD-10-CM | POA: Diagnosis not present

## 2020-07-01 DIAGNOSIS — D631 Anemia in chronic kidney disease: Secondary | ICD-10-CM | POA: Diagnosis not present

## 2020-07-01 DIAGNOSIS — N186 End stage renal disease: Secondary | ICD-10-CM | POA: Diagnosis not present

## 2020-07-01 DIAGNOSIS — Z992 Dependence on renal dialysis: Secondary | ICD-10-CM | POA: Diagnosis not present

## 2020-07-01 DIAGNOSIS — N2581 Secondary hyperparathyroidism of renal origin: Secondary | ICD-10-CM | POA: Diagnosis not present

## 2020-07-03 LAB — CLOSTRIDIUM DIFFICILE CULTURE-FECAL

## 2020-07-03 NOTE — Progress Notes (Signed)
C-diff culture was negative.

## 2020-07-04 DIAGNOSIS — D631 Anemia in chronic kidney disease: Secondary | ICD-10-CM | POA: Diagnosis not present

## 2020-07-04 DIAGNOSIS — N2581 Secondary hyperparathyroidism of renal origin: Secondary | ICD-10-CM | POA: Diagnosis not present

## 2020-07-04 DIAGNOSIS — Z992 Dependence on renal dialysis: Secondary | ICD-10-CM | POA: Diagnosis not present

## 2020-07-04 DIAGNOSIS — N186 End stage renal disease: Secondary | ICD-10-CM | POA: Diagnosis not present

## 2020-07-06 DIAGNOSIS — Z992 Dependence on renal dialysis: Secondary | ICD-10-CM | POA: Diagnosis not present

## 2020-07-06 DIAGNOSIS — D631 Anemia in chronic kidney disease: Secondary | ICD-10-CM | POA: Diagnosis not present

## 2020-07-06 DIAGNOSIS — N186 End stage renal disease: Secondary | ICD-10-CM | POA: Diagnosis not present

## 2020-07-06 DIAGNOSIS — N2581 Secondary hyperparathyroidism of renal origin: Secondary | ICD-10-CM | POA: Diagnosis not present

## 2020-07-08 DIAGNOSIS — Z992 Dependence on renal dialysis: Secondary | ICD-10-CM | POA: Diagnosis not present

## 2020-07-08 DIAGNOSIS — N2581 Secondary hyperparathyroidism of renal origin: Secondary | ICD-10-CM | POA: Diagnosis not present

## 2020-07-08 DIAGNOSIS — N186 End stage renal disease: Secondary | ICD-10-CM | POA: Diagnosis not present

## 2020-07-08 DIAGNOSIS — D631 Anemia in chronic kidney disease: Secondary | ICD-10-CM | POA: Diagnosis not present

## 2020-07-11 DIAGNOSIS — D631 Anemia in chronic kidney disease: Secondary | ICD-10-CM | POA: Diagnosis not present

## 2020-07-11 DIAGNOSIS — Z992 Dependence on renal dialysis: Secondary | ICD-10-CM | POA: Diagnosis not present

## 2020-07-11 DIAGNOSIS — N186 End stage renal disease: Secondary | ICD-10-CM | POA: Diagnosis not present

## 2020-07-11 DIAGNOSIS — N2581 Secondary hyperparathyroidism of renal origin: Secondary | ICD-10-CM | POA: Diagnosis not present

## 2020-07-13 DIAGNOSIS — N186 End stage renal disease: Secondary | ICD-10-CM | POA: Diagnosis not present

## 2020-07-13 DIAGNOSIS — D631 Anemia in chronic kidney disease: Secondary | ICD-10-CM | POA: Diagnosis not present

## 2020-07-13 DIAGNOSIS — Z992 Dependence on renal dialysis: Secondary | ICD-10-CM | POA: Diagnosis not present

## 2020-07-13 DIAGNOSIS — N2581 Secondary hyperparathyroidism of renal origin: Secondary | ICD-10-CM | POA: Diagnosis not present

## 2020-07-14 DIAGNOSIS — Z041 Encounter for examination and observation following transport accident: Secondary | ICD-10-CM | POA: Diagnosis not present

## 2020-07-14 DIAGNOSIS — R519 Headache, unspecified: Secondary | ICD-10-CM | POA: Diagnosis not present

## 2020-07-14 DIAGNOSIS — R55 Syncope and collapse: Secondary | ICD-10-CM | POA: Diagnosis not present

## 2020-07-15 DIAGNOSIS — D631 Anemia in chronic kidney disease: Secondary | ICD-10-CM | POA: Diagnosis not present

## 2020-07-15 DIAGNOSIS — Z992 Dependence on renal dialysis: Secondary | ICD-10-CM | POA: Diagnosis not present

## 2020-07-15 DIAGNOSIS — N186 End stage renal disease: Secondary | ICD-10-CM | POA: Diagnosis not present

## 2020-07-15 DIAGNOSIS — N2581 Secondary hyperparathyroidism of renal origin: Secondary | ICD-10-CM | POA: Diagnosis not present

## 2020-07-18 DIAGNOSIS — D631 Anemia in chronic kidney disease: Secondary | ICD-10-CM | POA: Diagnosis not present

## 2020-07-18 DIAGNOSIS — N2581 Secondary hyperparathyroidism of renal origin: Secondary | ICD-10-CM | POA: Diagnosis not present

## 2020-07-18 DIAGNOSIS — N186 End stage renal disease: Secondary | ICD-10-CM | POA: Diagnosis not present

## 2020-07-18 DIAGNOSIS — Z992 Dependence on renal dialysis: Secondary | ICD-10-CM | POA: Diagnosis not present

## 2020-07-20 DIAGNOSIS — N2581 Secondary hyperparathyroidism of renal origin: Secondary | ICD-10-CM | POA: Diagnosis not present

## 2020-07-20 DIAGNOSIS — D631 Anemia in chronic kidney disease: Secondary | ICD-10-CM | POA: Diagnosis not present

## 2020-07-20 DIAGNOSIS — Z992 Dependence on renal dialysis: Secondary | ICD-10-CM | POA: Diagnosis not present

## 2020-07-20 DIAGNOSIS — N186 End stage renal disease: Secondary | ICD-10-CM | POA: Diagnosis not present

## 2020-07-21 ENCOUNTER — Other Ambulatory Visit: Payer: Self-pay | Admitting: Adult Health

## 2020-07-21 DIAGNOSIS — M5412 Radiculopathy, cervical region: Secondary | ICD-10-CM

## 2020-07-21 DIAGNOSIS — F339 Major depressive disorder, recurrent, unspecified: Secondary | ICD-10-CM

## 2020-07-22 DIAGNOSIS — D631 Anemia in chronic kidney disease: Secondary | ICD-10-CM | POA: Diagnosis not present

## 2020-07-22 DIAGNOSIS — N2581 Secondary hyperparathyroidism of renal origin: Secondary | ICD-10-CM | POA: Diagnosis not present

## 2020-07-22 DIAGNOSIS — Z20822 Contact with and (suspected) exposure to covid-19: Secondary | ICD-10-CM | POA: Diagnosis not present

## 2020-07-22 DIAGNOSIS — T8612 Kidney transplant failure: Secondary | ICD-10-CM | POA: Diagnosis not present

## 2020-07-22 DIAGNOSIS — Z992 Dependence on renal dialysis: Secondary | ICD-10-CM | POA: Diagnosis not present

## 2020-07-22 DIAGNOSIS — N186 End stage renal disease: Secondary | ICD-10-CM | POA: Diagnosis not present

## 2020-07-23 DIAGNOSIS — Z20822 Contact with and (suspected) exposure to covid-19: Secondary | ICD-10-CM | POA: Diagnosis not present

## 2020-07-25 DIAGNOSIS — Z992 Dependence on renal dialysis: Secondary | ICD-10-CM | POA: Diagnosis not present

## 2020-07-25 DIAGNOSIS — D631 Anemia in chronic kidney disease: Secondary | ICD-10-CM | POA: Diagnosis not present

## 2020-07-25 DIAGNOSIS — N186 End stage renal disease: Secondary | ICD-10-CM | POA: Diagnosis not present

## 2020-07-25 DIAGNOSIS — N2581 Secondary hyperparathyroidism of renal origin: Secondary | ICD-10-CM | POA: Diagnosis not present

## 2020-07-26 ENCOUNTER — Other Ambulatory Visit: Payer: Self-pay

## 2020-07-26 DIAGNOSIS — E785 Hyperlipidemia, unspecified: Secondary | ICD-10-CM

## 2020-07-26 DIAGNOSIS — M4712 Other spondylosis with myelopathy, cervical region: Secondary | ICD-10-CM | POA: Diagnosis not present

## 2020-07-26 MED ORDER — PRAVASTATIN SODIUM 40 MG PO TABS: ORAL_TABLET | ORAL | 2 refills | Status: DC

## 2020-07-27 DIAGNOSIS — N2581 Secondary hyperparathyroidism of renal origin: Secondary | ICD-10-CM | POA: Diagnosis not present

## 2020-07-27 DIAGNOSIS — Z992 Dependence on renal dialysis: Secondary | ICD-10-CM | POA: Diagnosis not present

## 2020-07-27 DIAGNOSIS — D631 Anemia in chronic kidney disease: Secondary | ICD-10-CM | POA: Diagnosis not present

## 2020-07-27 DIAGNOSIS — N186 End stage renal disease: Secondary | ICD-10-CM | POA: Diagnosis not present

## 2020-07-29 ENCOUNTER — Telehealth: Payer: Self-pay | Admitting: *Deleted

## 2020-07-29 DIAGNOSIS — D631 Anemia in chronic kidney disease: Secondary | ICD-10-CM | POA: Diagnosis not present

## 2020-07-29 DIAGNOSIS — Z992 Dependence on renal dialysis: Secondary | ICD-10-CM | POA: Diagnosis not present

## 2020-07-29 DIAGNOSIS — N2581 Secondary hyperparathyroidism of renal origin: Secondary | ICD-10-CM | POA: Diagnosis not present

## 2020-07-29 DIAGNOSIS — N186 End stage renal disease: Secondary | ICD-10-CM | POA: Diagnosis not present

## 2020-07-29 NOTE — Telephone Encounter (Signed)
   [facility] Pre-operative Risk Assessment    Patient Name: TANNAR BROKER  DOB: 1960-11-18 MRN: [id number]  [facility] STAFF:  Request for surgical clearance:  What type of surgery is being performed? C6-7 anterior cervical decompression/discectomy/fusion/removal of anterior cervical plate  When is this surgery scheduled? TBD  What type of clearance is required (medical clearance vs. Pharmacy clearance to hold med vs. Both)? both  Are there any medications that need to be held prior to surgery and how long? ASA  Practice name and name of physician performing surgery? [facility] Dr. Marcello Moores  What is the office phone number? [phone]   7.   What is the office fax number? [phone]  8.   Anesthesia type (None, local, MAC, general) ? general   Quinntin Malter A Earnest Mcgillis 07/29/2020, 2:30 PM  _________________________________________________________________   (provider comments below)

## 2020-07-29 NOTE — Telephone Encounter (Signed)
1st attempt to reach pt regarding needing an appointment for surgical clearance.  Left message for pt to call back to schedule.

## 2020-07-29 NOTE — Telephone Encounter (Signed)
   Name: Kirk [name]  DOB: 04-01-1960  MRN: [id number]  Primary Cardiologist: [name], MD  Chart reviewed as part of pre-operative protocol coverage. Because of Kirk [name]'s past medical history and time since last visit, he will require a follow-up visit in order to better assess preoperative cardiovascular risk.  Last OV was 06/2019, so > 1 year prior.  Pre-op covering staff: - Please schedule appointment and call patient to inform them.  - Please contact requesting surgeon's office via preferred method (i.e, phone, fax) to inform them of need for appointment prior to surgery.  Per preliminary chart review, no hx of CAD, MI, PCI, stroke so do not see contraindication to holding ASA (unless new PMH revealed at [place]) - for this reason will hold off routing to MD.  Charlie Pitter, PA-C  07/29/2020, 2:50 PM

## 2020-08-01 ENCOUNTER — Telehealth: Payer: Self-pay | Admitting: Internal Medicine

## 2020-08-01 DIAGNOSIS — N2581 Secondary hyperparathyroidism of renal origin: Secondary | ICD-10-CM | POA: Diagnosis not present

## 2020-08-01 DIAGNOSIS — Z992 Dependence on renal dialysis: Secondary | ICD-10-CM | POA: Diagnosis not present

## 2020-08-01 DIAGNOSIS — N186 End stage renal disease: Secondary | ICD-10-CM | POA: Diagnosis not present

## 2020-08-01 DIAGNOSIS — D631 Anemia in chronic kidney disease: Secondary | ICD-10-CM | POA: Diagnosis not present

## 2020-08-01 NOTE — Telephone Encounter (Signed)
Can do at apt on 8/4 with Dr. Melvyn Novas; if needs clearance sooner would need visit

## 2020-08-01 NOTE — Telephone Encounter (Signed)
Received a fax today for patient needing surgical clearance.  Patient last seen 05/24/20 by Geraldo Pitter and next OV is with Dr. Melvyn Novas on 08/25/20.  Patient has cancelled the last 2 July appointments.   Sending to Dr. Melvyn Novas and [facility] for recommendations to do a risk assessment for his surgery - c6-7 ACDF by Dr. Marcello Moores  or if patient needs appointment.

## 2020-08-03 DIAGNOSIS — Z992 Dependence on renal dialysis: Secondary | ICD-10-CM | POA: Diagnosis not present

## 2020-08-03 DIAGNOSIS — N186 End stage renal disease: Secondary | ICD-10-CM | POA: Diagnosis not present

## 2020-08-03 DIAGNOSIS — D631 Anemia in chronic kidney disease: Secondary | ICD-10-CM | POA: Diagnosis not present

## 2020-08-03 DIAGNOSIS — N2581 Secondary hyperparathyroidism of renal origin: Secondary | ICD-10-CM | POA: Diagnosis not present

## 2020-08-03 NOTE — Telephone Encounter (Signed)
Pt has appt 08/23/20 with Laurann Montana, NP. I will forward clearance notes to NP for upcoming appt. Will send FYI to surgeon's office pt has appt 08/23/20.

## 2020-08-03 NOTE — Telephone Encounter (Signed)
Call placed to pt regarding need for an appointment for surgical clearance.  No DPR on file for [facility], so could not go into details with spouse.  Did ask her to have him give [place] a call back. If pt calls back, please schedule appointment for surgical clearance.

## 2020-08-04 ENCOUNTER — Ambulatory Visit: Payer: Medicare Other | Admitting: Podiatry

## 2020-08-05 DIAGNOSIS — Z992 Dependence on renal dialysis: Secondary | ICD-10-CM | POA: Diagnosis not present

## 2020-08-05 DIAGNOSIS — N2581 Secondary hyperparathyroidism of renal origin: Secondary | ICD-10-CM | POA: Diagnosis not present

## 2020-08-05 DIAGNOSIS — N186 End stage renal disease: Secondary | ICD-10-CM | POA: Diagnosis not present

## 2020-08-05 DIAGNOSIS — D631 Anemia in chronic kidney disease: Secondary | ICD-10-CM | POA: Diagnosis not present

## 2020-08-08 DIAGNOSIS — N186 End stage renal disease: Secondary | ICD-10-CM | POA: Diagnosis not present

## 2020-08-08 DIAGNOSIS — D631 Anemia in chronic kidney disease: Secondary | ICD-10-CM | POA: Diagnosis not present

## 2020-08-08 DIAGNOSIS — Z992 Dependence on renal dialysis: Secondary | ICD-10-CM | POA: Diagnosis not present

## 2020-08-08 DIAGNOSIS — N2581 Secondary hyperparathyroidism of renal origin: Secondary | ICD-10-CM | POA: Diagnosis not present

## 2020-08-09 ENCOUNTER — Ambulatory Visit: Payer: Medicare Other | Admitting: Internal Medicine

## 2020-08-10 DIAGNOSIS — Z992 Dependence on renal dialysis: Secondary | ICD-10-CM | POA: Diagnosis not present

## 2020-08-10 DIAGNOSIS — D631 Anemia in chronic kidney disease: Secondary | ICD-10-CM | POA: Diagnosis not present

## 2020-08-10 DIAGNOSIS — N186 End stage renal disease: Secondary | ICD-10-CM | POA: Diagnosis not present

## 2020-08-10 DIAGNOSIS — N2581 Secondary hyperparathyroidism of renal origin: Secondary | ICD-10-CM | POA: Diagnosis not present

## 2020-08-11 NOTE — Telephone Encounter (Signed)
Called pt and there was no answer-LMTCB °

## 2020-08-12 ENCOUNTER — Other Ambulatory Visit: Payer: Self-pay | Admitting: Internal Medicine

## 2020-08-12 DIAGNOSIS — N2581 Secondary hyperparathyroidism of renal origin: Secondary | ICD-10-CM | POA: Diagnosis not present

## 2020-08-12 DIAGNOSIS — Z992 Dependence on renal dialysis: Secondary | ICD-10-CM | POA: Diagnosis not present

## 2020-08-12 DIAGNOSIS — N186 End stage renal disease: Secondary | ICD-10-CM | POA: Diagnosis not present

## 2020-08-12 DIAGNOSIS — D631 Anemia in chronic kidney disease: Secondary | ICD-10-CM | POA: Diagnosis not present

## 2020-08-15 DIAGNOSIS — N186 End stage renal disease: Secondary | ICD-10-CM | POA: Diagnosis not present

## 2020-08-15 DIAGNOSIS — Z992 Dependence on renal dialysis: Secondary | ICD-10-CM | POA: Diagnosis not present

## 2020-08-15 DIAGNOSIS — N2581 Secondary hyperparathyroidism of renal origin: Secondary | ICD-10-CM | POA: Diagnosis not present

## 2020-08-15 DIAGNOSIS — D631 Anemia in chronic kidney disease: Secondary | ICD-10-CM | POA: Diagnosis not present

## 2020-08-17 DIAGNOSIS — D631 Anemia in chronic kidney disease: Secondary | ICD-10-CM | POA: Diagnosis not present

## 2020-08-17 DIAGNOSIS — N186 End stage renal disease: Secondary | ICD-10-CM | POA: Diagnosis not present

## 2020-08-17 DIAGNOSIS — N2581 Secondary hyperparathyroidism of renal origin: Secondary | ICD-10-CM | POA: Diagnosis not present

## 2020-08-17 DIAGNOSIS — Z992 Dependence on renal dialysis: Secondary | ICD-10-CM | POA: Diagnosis not present

## 2020-08-18 ENCOUNTER — Ambulatory Visit: Payer: Medicare Other | Admitting: Internal Medicine

## 2020-08-19 DIAGNOSIS — N186 End stage renal disease: Secondary | ICD-10-CM | POA: Diagnosis not present

## 2020-08-19 DIAGNOSIS — D631 Anemia in chronic kidney disease: Secondary | ICD-10-CM | POA: Diagnosis not present

## 2020-08-19 DIAGNOSIS — Z992 Dependence on renal dialysis: Secondary | ICD-10-CM | POA: Diagnosis not present

## 2020-08-19 DIAGNOSIS — N2581 Secondary hyperparathyroidism of renal origin: Secondary | ICD-10-CM | POA: Diagnosis not present

## 2020-08-22 DIAGNOSIS — N2581 Secondary hyperparathyroidism of renal origin: Secondary | ICD-10-CM | POA: Diagnosis not present

## 2020-08-22 DIAGNOSIS — Z992 Dependence on renal dialysis: Secondary | ICD-10-CM | POA: Diagnosis not present

## 2020-08-22 DIAGNOSIS — N186 End stage renal disease: Secondary | ICD-10-CM | POA: Diagnosis not present

## 2020-08-22 DIAGNOSIS — D631 Anemia in chronic kidney disease: Secondary | ICD-10-CM | POA: Diagnosis not present

## 2020-08-22 DIAGNOSIS — T8612 Kidney transplant failure: Secondary | ICD-10-CM | POA: Diagnosis not present

## 2020-08-23 ENCOUNTER — Other Ambulatory Visit: Payer: Self-pay

## 2020-08-23 ENCOUNTER — Encounter (HOSPITAL_BASED_OUTPATIENT_CLINIC_OR_DEPARTMENT_OTHER): Payer: Self-pay | Admitting: Family

## 2020-08-23 ENCOUNTER — Ambulatory Visit (INDEPENDENT_AMBULATORY_CARE_PROVIDER_SITE_OTHER): Payer: Medicare Other | Admitting: Family

## 2020-08-23 VITALS — BP 136/72 | HR 77 | Ht 67.0 in | Wt 216.0 lb

## 2020-08-23 DIAGNOSIS — I10 Essential (primary) hypertension: Secondary | ICD-10-CM

## 2020-08-23 DIAGNOSIS — I428 Other cardiomyopathies: Secondary | ICD-10-CM | POA: Diagnosis not present

## 2020-08-23 DIAGNOSIS — I350 Nonrheumatic aortic (valve) stenosis: Secondary | ICD-10-CM | POA: Diagnosis not present

## 2020-08-23 DIAGNOSIS — Z992 Dependence on renal dialysis: Secondary | ICD-10-CM

## 2020-08-23 DIAGNOSIS — N186 End stage renal disease: Secondary | ICD-10-CM

## 2020-08-23 DIAGNOSIS — Z01810 Encounter for preprocedural cardiovascular examination: Secondary | ICD-10-CM | POA: Diagnosis not present

## 2020-08-23 NOTE — Patient Instructions (Addendum)
Medication Instructions:  Continue your current medications.   You may hold your aspirin prior to your neck surgery.   *If you need a refill on your cardiac medications before your next appointment, please call your pharmacy*   Lab Work: None ordered today.   Testing/Procedures: Your physician has requested that you have an echocardiogram. Echocardiography is a painless test that uses sound waves to create images of your heart. It provides your doctor with information about the size and shape of your heart and how well your heart's chambers and valves are working. This procedure takes approximately one hour. There are no restrictions for this procedure.    Follow-Up: At [facility], you and your health needs are our priority.  As part of our continuing mission to provide you with exceptional heart care, we have created designated Provider Care Teams.  These Care Teams include your primary Cardiologist (physician) and Advanced Practice Providers (APPs -  Physician Assistants and Nurse Practitioners) who all work together to provide you with the care you need, when you need it.  We recommend signing up for the patient portal called "MyChart".  Sign up information is provided on this After Visit Summary.  MyChart is used to connect with patients for Virtual Visits (Telemedicine).  Patients are able to view lab/test results, encounter notes, upcoming appointments, etc.  Non-urgent messages can be sent to your provider as well.   To learn more about what you can do with MyChart, go to [email or web address].    Your next appointment:   6 month(s)  The format for your next appointment:   In Person  Provider:   You may see [name], MD or one of the following Advanced Practice Providers on your designated Care Team:   Almyra Deforest, PA-C Fabian Sharp, PA-C or  Roby Lofts, [place]  Other Instructions  Your MyChart username is [id number]  Loel Dubonnet, NP will send a note to your  surgery team
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
## 2020-08-24 ENCOUNTER — Encounter: Payer: Self-pay | Admitting: Family

## 2020-08-24 ENCOUNTER — Encounter (HOSPITAL_BASED_OUTPATIENT_CLINIC_OR_DEPARTMENT_OTHER): Payer: Self-pay | Admitting: Family

## 2020-08-24 DIAGNOSIS — D631 Anemia in chronic kidney disease: Secondary | ICD-10-CM | POA: Diagnosis not present

## 2020-08-24 DIAGNOSIS — N186 End stage renal disease: Secondary | ICD-10-CM | POA: Diagnosis not present

## 2020-08-24 DIAGNOSIS — Z992 Dependence on renal dialysis: Secondary | ICD-10-CM | POA: Diagnosis not present

## 2020-08-24 DIAGNOSIS — N2581 Secondary hyperparathyroidism of renal origin: Secondary | ICD-10-CM | POA: Diagnosis not present

## 2020-08-25 ENCOUNTER — Encounter: Payer: Self-pay | Admitting: Family

## 2020-08-25 ENCOUNTER — Other Ambulatory Visit: Payer: Self-pay

## 2020-08-25 ENCOUNTER — Encounter: Payer: Self-pay | Admitting: Internal Medicine

## 2020-08-25 ENCOUNTER — Ambulatory Visit (INDEPENDENT_AMBULATORY_CARE_PROVIDER_SITE_OTHER): Payer: Medicare Other | Admitting: Internal Medicine

## 2020-08-25 ENCOUNTER — Other Ambulatory Visit: Payer: Self-pay | Admitting: Internal Medicine

## 2020-08-25 ENCOUNTER — Ambulatory Visit (INDEPENDENT_AMBULATORY_CARE_PROVIDER_SITE_OTHER): Payer: Medicare Other | Admitting: Family

## 2020-08-25 VITALS — BP 140/70 | HR 73 | Temp 97.1°F | Resp 18 | Ht 67.0 in | Wt 217.0 lb

## 2020-08-25 DIAGNOSIS — R200 Anesthesia of skin: Secondary | ICD-10-CM

## 2020-08-25 DIAGNOSIS — F17210 Nicotine dependence, cigarettes, uncomplicated: Secondary | ICD-10-CM

## 2020-08-25 DIAGNOSIS — R202 Paresthesia of skin: Secondary | ICD-10-CM | POA: Diagnosis not present

## 2020-08-25 DIAGNOSIS — M170 Bilateral primary osteoarthritis of knee: Secondary | ICD-10-CM | POA: Diagnosis not present

## 2020-08-25 DIAGNOSIS — J4530 Mild persistent asthma, uncomplicated: Secondary | ICD-10-CM

## 2020-08-25 DIAGNOSIS — H6501 Acute serous otitis media, right ear: Secondary | ICD-10-CM

## 2020-08-25 DIAGNOSIS — M545 Low back pain, unspecified: Secondary | ICD-10-CM

## 2020-08-25 DIAGNOSIS — R413 Other amnesia: Secondary | ICD-10-CM | POA: Diagnosis not present

## 2020-08-25 DIAGNOSIS — G8929 Other chronic pain: Secondary | ICD-10-CM

## 2020-08-25 DIAGNOSIS — H9201 Otalgia, right ear: Secondary | ICD-10-CM | POA: Diagnosis not present

## 2020-08-25 DIAGNOSIS — M542 Cervicalgia: Secondary | ICD-10-CM

## 2020-08-25 MED ORDER — TRAMADOL HCL 50 MG PO TABS: 50.0000 mg | ORAL_TABLET | Freq: Every day | ORAL | 0 refills | Status: DC | PRN

## 2020-08-25 MED ORDER — NEOMYCIN-POLYMYXIN-HC 1 % OT SOLN: 3.0000 [drp] | Freq: Three times a day (TID) | OTIC | 0 refills | Status: AC

## 2020-08-25 NOTE — Patient Instructions (Signed)
No change in medications although if doing great you can take symbicort more as needed up to 2 puffs every 12 hours   We will contact you in April 2023 for the lung cancer screening program   Please schedule a follow up visit in 12 months but call sooner if needed

## 2020-08-25 NOTE — Assessment & Plan Note (Signed)
Stopped 02/2020 - referred to start Lung cancer screening 04/2021   Low-dose CT lung cancer screening is recommended for patients who are 55-58 years of age with a 30+ pack-year history of smoking, and who are currently smoking or quit <=15 years ago.   >>> ct in 04/2020 neg for suspicious nodules so good to go for this year and can start the program in 2023 as outline.  Discussed in detail all the  indications, usual  risks and alternatives  relative to the benefits with patient who agrees to proceed with the protocol as outlined.             Each maintenance medication was reviewed in detail including emphasizing most importantly the difference between maintenance and prns and under what circumstances the prns are to be triggered using an action plan format where appropriate.  Total time for H and P, chart review, counseling, reviewing hfa  device(s) and generating customized AVS unique to this office visit / same day charting  = 30 min

## 2020-08-25 NOTE — Progress Notes (Signed)
Subjective:   Patient ID: Kirk [name], male    DOB: [birth year]     MRN: [id number]    Brief patient profile:  60  yobm quit smoking 02/2020  referred 10/18/2014 to [name] for eval of ? Copd for clearance for lap chole  with  no airflow obst on spiromtery   10/18/2014 on HD since 2012    History of Present Illness  10/18/2014 1st [facility] office visit/ Kamdin Follett   Chief Complaint  Patient presents with   Advice Only    refer Dr. Nyoka Cowden. Denies any wheezing, no chest tx, no cough. Pt here to be evaluated for COPD.    variable sob with exertion - says can walk fine but no regular walking,  Some sob steps   rec Plan A = Backup=  symbiocrt 80 Take 2 puffs first thing in am and then another 2 puffs about 12 hours later.  Plan B = Back up Only use your combivent  as a rescue medication  Plan C = crisis Only use your nebulizer if you try B first and doesn't work You are cleared for surgery  > tol lap chole fine    09/30/2017  f/u ov/Lamarius Dirr re: asthma/ smoker/ f/u [facility] pna Chief Complaint  Patient presents with   Follow-up    Pt c/o increased cough with white sputum, SOB, nasal congestion, runny nose x 2 wks.   Dyspnea:  Walked at [employer] using rollator 2 d prior to OV   Cough: white esp hs  Sleeping: not needing saba no elevate  SABA use: more need over the last 2 weeks 2-3 days/ neb x 2  02: just at hd   rec Plan A = Automatic = symbicort increase to 160 Take 2 puffs first thing in am and then another 2 puffs about 12 hours later.  Work on [employer] B = Backup Only use your albuterol (ventolin)  as a rescue medication Plan C = Crisis - only use your albuterol nebulizer if you first try Plan B and it fails to help > ok to use the nebulizer up to every 4 hours but if start needing it regularly call for immediate appointment The key is to stop smoking completely before smoking completely stops you!      01/08/2019  f/u ov/Zimere Dunlevy re: AB / symb 160  2bid  Chief Complaint  Patient presents with   Follow-up    Breathing is doing well today and no new co's.    Dyspnea:  Walking at mall x 47min-20 min avg pace x up to sev times a week Cough: none  Sleeping: able to lie in flat bed  SABA use: rarely use saba / no neb  02: no  No change breathing Sunday pm vs any other day - HD  MWF rec Plan A = Automatic = Always=    Symbicort 160 Take 2 puffs first thing in am and then another 2 puffs about 12 hours later.  Work on inhaler technique:  think  of a golfer warming up before you use the inhaler  Plan B = Backup (to supplement plan A, not to replace it) Only use your albuterol (ventolin and proventil) inhaler as a rescue medication Plan C = Crisis (instead of Plan B but only if Plan B stops working) - only use your albuterol nebulizer if you first try Plan B      08/25/2020  f/u ov/Agripina Guyette re: AB  maint on symb  160 2bid  and stopped smoking  Chief Complaint  Patient presents with   Follow-up    F/U asthma    Dyspnea:  walking 20 min s stopping some hills Cough: none  Sleeping: cpap/ [place]  SABA use: not needing  02: none Covid status:   vax x 3    No obvious day to day or daytime variability or assoc excess/ purulent sputum or mucus plugs or hemoptysis or cp or chest tightness, subjective wheeze or overt sinus or hb symptoms.   Sleeping  without nocturnal  or early am exacerbation  of respiratory  c/o's or need for noct saba. Also denies any obvious fluctuation of symptoms with weather or environmental changes or other aggravating or alleviating factors except as outlined above   No unusual exposure hx or h/o childhood pna/ asthma or knowledge of premature birth.  Current Allergies, Complete Past Medical History, Past Surgical History, Family History, and Social History were reviewed in [employer] record.  ROS  The following are not active complaints unless bolded Hoarseness, sore throat, dysphagia,  dental problems, itching, sneezing,  nasal congestion or discharge of excess mucus or purulent secretions, ear ache,   fever, chills, sweats, unintended wt loss or wt gain, classically pleuritic or exertional cp,  orthopnea pnd or arm/hand swelling  or leg swelling, presyncope, palpitations, abdominal pain, anorexia, nausea, vomiting, diarrhea  or change in bowel habits or change in bladder habits, change in stools or change in urine, dysuria, hematuria,  rash, arthralgias, visual complaints, headache, numbness, weakness or ataxia or problems with walking or coordination,  change in mood or  memory.        Current Meds  Medication Sig   acetaminophen (TYLENOL) 500 MG tablet Take 1 tablet (500 mg total) by mouth every 6 (six) hours as needed for moderate pain.   albuterol (PROVENTIL HFA;VENTOLIN HFA) 108 (90 Base) MCG/ACT inhaler Inhale 2 puffs into the lungs every 6 (six) hours as needed for wheezing or shortness of breath.   amLODipine (NORVASC) 10 MG tablet TAKE 1 TABLET BY MOUTH EVERY DAY   aspirin EC 81 MG tablet Take 81 mg by mouth daily.   b complex-vitamin c-folic acid (NEPHRO-VITE) 0.8 MG TABS tablet TAKE 1 TABLET BY MOUTH EVERY DAY   budesonide-formoterol (SYMBICORT) 160-4.5 MCG/ACT inhaler Inhale 2 puffs into the lungs 2 (two) times daily.   calcitRIOL (ROCALTROL) 0.5 MCG capsule Take 2 capsules (1 mcg total) by mouth every Monday, Wednesday, and Friday with hemodialysis.   chlorhexidine (PERIDEX) 0.12 % solution SMARTSIG:By Mouth   Cholecalciferol (VITAMIN D3) 25 MCG (1000 UT) CAPS Take 4,000 Units by mouth daily.   cinacalcet (SENSIPAR) 90 MG tablet Take 90 mg by mouth every evening.   clonazePAM (KLONOPIN) 0.5 MG tablet TAKE 1 TABLET BY MOUTH 2 TIMES DAILY AS NEEDED FOR ANXIETY.   cloNIDine (CATAPRES) 0.1 MG tablet Take 0.1 mg by mouth at bedtime.   cyclobenzaprine (FLEXERIL) 5 MG tablet Take 1 tablet (5 mg total) by mouth at bedtime as needed for muscle spasms.   diphenhydrAMINE  (BENADRYL) 25 MG tablet Take 50 mg by mouth at bedtime.   doxazosin (CARDURA) 4 MG tablet Take 4 mg by mouth daily.   DULoxetine (CYMBALTA) 30 MG capsule Take 1 capsule (30 mg total) by mouth daily.   famotidine (PEPCID) 20 MG tablet Take 1 tablet (20 mg total) by mouth at bedtime.   gabapentin (NEURONTIN) 100 MG capsule Take 1 capsule (100 mg total) by mouth 2 (  two) times daily.   guaiFENesin (MUCINEX) 600 MG 12 hr tablet Take 1,200 mg by mouth 2 (two) times daily.   hydrALAZINE (APRESOLINE) 100 MG tablet TAKE 1 TABLET EVERY MORNINGAND AT BEDTIME.   ipratropium-albuterol (DUONEB) 0.5-2.5 (3) MG/3ML SOLN Take 3 mLs by nebulization every 4 (four) hours as needed (Shortness of breath).   isosorbide mononitrate (IMDUR) 30 MG 24 hr tablet TAKE 1 TABLET DAILY   lidocaine-prilocaine (EMLA) cream Apply 1 application topically See admin instructions. Apply 1 to 2 hours prior to dialysis on Mondays, Wednesdays, and Fridays. Cover with saran wrap.   metoprolol tartrate (LOPRESSOR) 100 MG tablet TAKE 1 TABLET BY MOUTH TWICE A DAY   omeprazole (PRILOSEC) 20 MG capsule TAKE 1 CAPSULE TWICE DAILY BEFORE A MEAL   ondansetron (ZOFRAN ODT) 4 MG disintegrating tablet Take 1 tablet (4 mg total) by mouth every 8 (eight) hours as needed for nausea or vomiting.   pravastatin (PRAVACHOL) 40 MG tablet TAKE 1 TABLET BY MOUTH EVERY DAY IN THE EVENING   PRESCRIPTION MEDICATION See admin instructions. CPAP- At bedtime and during any time of rest   RENVELA 800 MG tablet Take 800-2,400 mg by mouth See admin instructions. Take 2,400 mg by mouth three times a day with meals and 800 mg with each snack   traMADol (ULTRAM) 50 MG tablet TAKE 1 TABLET BY MOUTH DAILY AS NEEDED.                                    Objective:   Physical Exam  08/25/2020         216    08/06/2019      206 01/08/2019     209  10/09/2018       213  07/08/2018       222 03/31/2018         229  12/30/2017       222  09/30/2017         222   07/11/2017       217   10/18/14 233 lb 12.8 oz (106.051 kg)  10/05/14 235 lb 3.2 oz (106.686 kg)  09/20/14 230 lb 6.1 oz (104.5 kg)    Vital signs reviewed  08/25/2020  - Note at rest 02 sats  100% on RA   General appearance:    amb bm nad    HEENT : pt wearing mask not removed for exam due to covid -19 concerns.    NECK :  without JVD/Nodes/TM/ nl carotid upstrokes bilaterally   LUNGS: no acc muscle use,  Nl contour chest which is clear to A and P bilaterally without cough on insp or exp maneuvers   CV:  RRR  no s3 or murmur or increase in P2, and no edema   ABD:  obese soft and nontender with nl inspiratory excursion in the supine position. No bruits or organomegaly appreciated, bowel sounds nl  MS:  Nl gait/ ext warm without deformities, calf tenderness, cyanosis or clubbing No obvious joint restrictions   SKIN: warm and dry without lesions    NEURO:  alert, approp, nl sensorium with  no motor or cerebellar deficits apparent.                  I personally reviewed images and agree with radiology impression as follows:   Chest CT 05/06/20 Scattered centrilobular tree-in-bud nodularity within the lungs that are most pronounced in  bilateral lower lobes. No focal consolidation. No pulmonary nodule. No pulmonary mass. No pulmonary contusion or laceration. No pneumatocele formation.       Assessment & Plan:
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
## 2020-08-25 NOTE — Assessment & Plan Note (Addendum)
Quit smoking 02/2020  - Spirometry 07/11/2017  Restrictive/ min curvature on no rx - 07/11/2017   Try symb 80 2bid - 09/30/2017     try symbicort 160 2bid as has rhonchi on exam, still smoking  - Spirometry 03/31/2018  FEV1 1.4 (41%)  Ratio 0.72 min curvature p symb 160 x 2 in am with poor baseline hfa - 01/08/2019  After extensive coaching inhaler device,  effectiveness =    90% / reviewed golfer analogy taking practice inhalations using empty device    All goals of chronic asthma control met including optimal function and elimination of symptoms with minimal need for rescue therapy.  Contingencies discussed in full including contacting this office immediately if not controlling the symptoms using the rule of two's.     Ok to change to symbicort prn Based on two studies from NEJM  378; 20 p 1865 (2018) and 380 : p2020-30 (2019) in pts with mild asthma it is reasonable to use low dose symbicort eg 80 2bid "prn" flare in this setting but I emphasized this was only shown with symbicort and takes advantage of the rapid onset of action but is not the same as "rescue therapy" but can be stopped once the acute symptoms have resolved and the need for rescue has been minimized (< 2 x weekly)    Pt is cleared for surgery with low risk for pulmonary complications from general anesthesia.

## 2020-08-26 DIAGNOSIS — D631 Anemia in chronic kidney disease: Secondary | ICD-10-CM | POA: Diagnosis not present

## 2020-08-26 DIAGNOSIS — N2581 Secondary hyperparathyroidism of renal origin: Secondary | ICD-10-CM | POA: Diagnosis not present

## 2020-08-26 DIAGNOSIS — N186 End stage renal disease: Secondary | ICD-10-CM | POA: Diagnosis not present

## 2020-08-26 DIAGNOSIS — Z992 Dependence on renal dialysis: Secondary | ICD-10-CM | POA: Diagnosis not present

## 2020-08-26 NOTE — Telephone Encounter (Signed)
Patient was seen 08/25/20 by Dr. Melvyn Novas, nothing mentioned in office note.  Will route to Dr. Melvyn Novas for a risk assessment so patient can schedule neck surgery

## 2020-08-26 NOTE — Telephone Encounter (Signed)
Addendum done, see assessment notes

## 2020-08-26 NOTE — Telephone Encounter (Signed)
[facility]  to [place] Nothing further needed

## 2020-08-29 DIAGNOSIS — D631 Anemia in chronic kidney disease: Secondary | ICD-10-CM | POA: Diagnosis not present

## 2020-08-29 DIAGNOSIS — Z992 Dependence on renal dialysis: Secondary | ICD-10-CM | POA: Diagnosis not present

## 2020-08-29 DIAGNOSIS — N186 End stage renal disease: Secondary | ICD-10-CM | POA: Diagnosis not present

## 2020-08-29 DIAGNOSIS — N2581 Secondary hyperparathyroidism of renal origin: Secondary | ICD-10-CM | POA: Diagnosis not present

## 2020-08-31 DIAGNOSIS — N2581 Secondary hyperparathyroidism of renal origin: Secondary | ICD-10-CM | POA: Diagnosis not present

## 2020-08-31 DIAGNOSIS — Z992 Dependence on renal dialysis: Secondary | ICD-10-CM | POA: Diagnosis not present

## 2020-08-31 DIAGNOSIS — N186 End stage renal disease: Secondary | ICD-10-CM | POA: Diagnosis not present

## 2020-08-31 DIAGNOSIS — D631 Anemia in chronic kidney disease: Secondary | ICD-10-CM | POA: Diagnosis not present

## 2020-09-01 ENCOUNTER — Other Ambulatory Visit: Payer: Self-pay | Admitting: Family

## 2020-09-01 DIAGNOSIS — M542 Cervicalgia: Secondary | ICD-10-CM | POA: Diagnosis not present

## 2020-09-01 DIAGNOSIS — J4530 Mild persistent asthma, uncomplicated: Secondary | ICD-10-CM | POA: Diagnosis not present

## 2020-09-01 DIAGNOSIS — E669 Obesity, unspecified: Secondary | ICD-10-CM | POA: Diagnosis not present

## 2020-09-01 DIAGNOSIS — Z992 Dependence on renal dialysis: Secondary | ICD-10-CM | POA: Diagnosis not present

## 2020-09-01 DIAGNOSIS — Z01818 Encounter for other preprocedural examination: Secondary | ICD-10-CM | POA: Diagnosis not present

## 2020-09-01 DIAGNOSIS — Q8781 Alport syndrome: Secondary | ICD-10-CM | POA: Diagnosis not present

## 2020-09-01 DIAGNOSIS — N186 End stage renal disease: Secondary | ICD-10-CM | POA: Diagnosis not present

## 2020-09-01 DIAGNOSIS — G4733 Obstructive sleep apnea (adult) (pediatric): Secondary | ICD-10-CM | POA: Diagnosis not present

## 2020-09-01 DIAGNOSIS — I10 Essential (primary) hypertension: Secondary | ICD-10-CM | POA: Diagnosis not present

## 2020-09-01 DIAGNOSIS — Z7682 Awaiting organ transplant status: Secondary | ICD-10-CM | POA: Diagnosis not present

## 2020-09-01 DIAGNOSIS — M545 Low back pain, unspecified: Secondary | ICD-10-CM

## 2020-09-01 DIAGNOSIS — Z87891 Personal history of nicotine dependence: Secondary | ICD-10-CM | POA: Diagnosis not present

## 2020-09-01 DIAGNOSIS — Z940 Kidney transplant status: Secondary | ICD-10-CM | POA: Diagnosis not present

## 2020-09-02 DIAGNOSIS — D631 Anemia in chronic kidney disease: Secondary | ICD-10-CM | POA: Diagnosis not present

## 2020-09-02 DIAGNOSIS — N2581 Secondary hyperparathyroidism of renal origin: Secondary | ICD-10-CM | POA: Diagnosis not present

## 2020-09-02 DIAGNOSIS — Z992 Dependence on renal dialysis: Secondary | ICD-10-CM | POA: Diagnosis not present

## 2020-09-02 DIAGNOSIS — N186 End stage renal disease: Secondary | ICD-10-CM | POA: Diagnosis not present

## 2020-09-05 DIAGNOSIS — N186 End stage renal disease: Secondary | ICD-10-CM | POA: Diagnosis not present

## 2020-09-05 DIAGNOSIS — Z992 Dependence on renal dialysis: Secondary | ICD-10-CM | POA: Diagnosis not present

## 2020-09-05 DIAGNOSIS — D631 Anemia in chronic kidney disease: Secondary | ICD-10-CM | POA: Diagnosis not present

## 2020-09-05 DIAGNOSIS — N2581 Secondary hyperparathyroidism of renal origin: Secondary | ICD-10-CM | POA: Diagnosis not present

## 2020-09-06 ENCOUNTER — Encounter: Payer: Self-pay | Admitting: Podiatry

## 2020-09-06 ENCOUNTER — Encounter: Payer: Self-pay | Admitting: Family

## 2020-09-06 ENCOUNTER — Ambulatory Visit (INDEPENDENT_AMBULATORY_CARE_PROVIDER_SITE_OTHER): Payer: Medicare Other | Admitting: Podiatry

## 2020-09-06 ENCOUNTER — Ambulatory Visit (INDEPENDENT_AMBULATORY_CARE_PROVIDER_SITE_OTHER): Payer: Medicare Other | Admitting: Family

## 2020-09-06 ENCOUNTER — Other Ambulatory Visit: Payer: Self-pay

## 2020-09-06 DIAGNOSIS — Z0000 Encounter for general adult medical examination without abnormal findings: Secondary | ICD-10-CM | POA: Diagnosis not present

## 2020-09-06 DIAGNOSIS — M79675 Pain in left toe(s): Secondary | ICD-10-CM | POA: Diagnosis not present

## 2020-09-06 DIAGNOSIS — N186 End stage renal disease: Secondary | ICD-10-CM | POA: Diagnosis not present

## 2020-09-06 DIAGNOSIS — B351 Tinea unguium: Secondary | ICD-10-CM

## 2020-09-06 DIAGNOSIS — Z992 Dependence on renal dialysis: Secondary | ICD-10-CM

## 2020-09-06 DIAGNOSIS — M79674 Pain in right toe(s): Secondary | ICD-10-CM | POA: Diagnosis not present

## 2020-09-06 NOTE — Progress Notes (Signed)
  Subjective:  Patient ID: Kirk [name], male    DOB: 09/10/1960,  MRN: [id number]  Chief Complaint  Patient presents with   Nail Problem    Thick painful toenails, 4 month follow up    60 y.o. male presents with the above complaint. History confirmed with patient.   Objective:  Physical Exam: warm, good capillary refill, no trophic changes or ulcerative lesions, normal DP and PT pulses and normal sensory exam.  Severe thickened elongated toenails with brown discoloration onychomycosis x10 Assessment:   1. Pain due to onychomycosis of toenails of both feet      Plan:  Patient was evaluated and treated and all questions answered.  Discussed the etiology and treatment options for the condition in detail with the patient. Educated patient on the topical and oral treatment options for mycotic nails. Recommended debridement of the nails today. Sharp and mechanical debridement performed of all painful and mycotic nails today. Nails debrided in length and thickness using a nail nipper to level of comfort. Discussed treatment options including appropriate shoe gear. Follow up as needed for painful nails.   Return in about 3 months (around 12/07/2020) for RFC.

## 2020-09-06 NOTE — Patient Instructions (Signed)
Mr. Kirk [name] , Thank you for taking time to come for your Medicare Wellness Visit. I appreciate your ongoing commitment to your health goals. Please review the following plan we discussed and let me know if I can assist you in the future.   Screening recommendations/referrals: Colonoscopy : Up to date  Recommended yearly ophthalmology/optometry visit for glaucoma screening and checkup Recommended yearly dental visit for hygiene and checkup  Vaccinations: Influenza vaccine Due September,2022 Pneumococcal vaccine Due  Tdap vaccine: Please get Tdap vaccine at the pharmacy.  Shingles vaccine:  Please to get Tdap vaccine at the pharmacy.   Advanced directives: No   Conditions/risks identified: Advance age male > 47 yrs,male gender,Hx of smoking,Obesity BMI > 30,Sedentary lifestyle.  Next appointment: 1 year   Preventive Care 40-64 Years, Male Preventive care refers to lifestyle choices and visits with your health care provider that can promote health and wellness. What does preventive care include? A yearly physical exam. This is also called an annual well check. Dental exams once or twice a year. Routine eye exams. Ask your health care provider how often you should have your eyes checked. Personal lifestyle choices, including: Daily care of your teeth and gums. Regular physical activity. Eating a healthy diet. Avoiding tobacco and drug use. Limiting alcohol use. Practicing safe sex. Taking low-dose aspirin every day starting at age 58. What happens during an annual well check? The services and screenings done by your health care provider during your annual well check will depend on your age, overall health, lifestyle risk factors, and family history of disease. Counseling  Your health care provider may ask you questions about your: Alcohol use. Tobacco use. Drug use. Emotional well-being. Home and relationship well-being. Sexual activity. Eating habits. Work and work  Statistician. Screening  You may have the following tests or measurements: Height, weight, and BMI. Blood pressure. Lipid and cholesterol levels. These may be checked every 5 years, or more frequently if you are over 56 years old. Skin check. Lung cancer screening. You may have this screening every year starting at age 37 if you have a 30-pack-year history of smoking and currently smoke or have quit within the past 15 years. Fecal occult blood test (FOBT) of the stool. You may have this test every year starting at age 8. Flexible sigmoidoscopy or colonoscopy. You may have a sigmoidoscopy every 5 years or a colonoscopy every 10 years starting at age 20. Prostate cancer screening. Recommendations will vary depending on your family history and other risks. Hepatitis C blood test. Hepatitis B blood test. Sexually transmitted disease (STD) testing. Diabetes screening. This is done by checking your blood sugar (glucose) after you have not eaten for a while (fasting). You may have this done every 1-3 years. Discuss your test results, treatment options, and if necessary, the need for more tests with your health care provider. Vaccines  Your health care provider may recommend certain vaccines, such as: Influenza vaccine. This is recommended every year. Tetanus, diphtheria, and acellular pertussis (Tdap, Td) vaccine. You may need a Td booster every 10 years. Zoster vaccine. You may need this after age 65. Pneumococcal 13-valent conjugate (PCV13) vaccine. You may need this if you have certain conditions and have not been vaccinated. Pneumococcal polysaccharide (PPSV23) vaccine. You may need one or two doses if you smoke cigarettes or if you have certain conditions. Talk to your health care provider about which screenings and vaccines you need and how often you need them. This information is not intended to replace  advice given to you by your health care provider. Make sure you discuss any questions you  have with your health care provider. Document Released: 02/04/2015 Document Revised: 09/28/2015 Document Reviewed: 11/09/2014 Elsevier Interactive Patient Education  2017 [place] Prevention in the Home Falls can cause injuries. They can happen to people of all ages. There are many things you can do to make your home safe and to help prevent falls. What can I do on the outside of my home? Regularly fix the edges of walkways and driveways and fix any cracks. Remove anything that might make you trip as you walk through a door, such as a raised step or threshold. Trim any bushes or trees on the path to your home. Use bright outdoor lighting. Clear any walking paths of anything that might make someone trip, such as rocks or tools. Regularly check to see if handrails are loose or broken. Make sure that both sides of any steps have handrails. Any raised decks and porches should have guardrails on the edges. Have any leaves, snow, or ice cleared regularly. Use sand or salt on walking paths during winter. Clean up any spills in your garage right away. This includes oil or grease spills. What can I do in the bathroom? Use night lights. Install grab bars by the toilet and in the tub and shower. Do not use towel bars as grab bars. Use non-skid mats or decals in the tub or shower. If you need to sit down in the shower, use a plastic, non-slip stool. Keep the floor dry. Clean up any water that spills on the floor as soon as it happens. Remove soap buildup in the tub or shower regularly. Attach bath mats securely with double-sided non-slip rug tape. Do not have throw rugs and other things on the floor that can make you trip. What can I do in the bedroom? Use night lights. Make sure that you have a light by your bed that is easy to reach. Do not use any sheets or blankets that are too big for your bed. They should not hang down onto the floor. Have a firm chair that has side arms. You can  use this for support while you get dressed. Do not have throw rugs and other things on the floor that can make you trip. What can I do in the kitchen? Clean up any spills right away. Avoid walking on wet floors. Keep items that you use a lot in easy-to-reach places. If you need to reach something above you, use a strong step stool that has a grab bar. Keep electrical cords out of the way. Do not use floor polish or wax that makes floors slippery. If you must use wax, use non-skid floor wax. Do not have throw rugs and other things on the floor that can make you trip. What can I do with my stairs? Do not leave any items on the stairs. Make sure that there are handrails on both sides of the stairs and use them. Fix handrails that are broken or loose. Make sure that handrails are as long as the stairways. Check any carpeting to make sure that it is firmly attached to the stairs. Fix any carpet that is loose or worn. Avoid having throw rugs at the top or bottom of the stairs. If you do have throw rugs, attach them to the floor with carpet tape. Make sure that you have a light switch at the top of the stairs and the bottom  of the stairs. If you do not have them, ask someone to add them for you. What else can I do to help prevent falls? Wear shoes that: Do not have high heels. Have rubber bottoms. Are comfortable and fit you well. Are closed at the toe. Do not wear sandals. If you use a stepladder: Make sure that it is fully opened. Do not climb a closed stepladder. Make sure that both sides of the stepladder are locked into place. Ask someone to hold it for you, if possible. Clearly mark and make sure that you can see: Any grab bars or handrails. First and last steps. Where the edge of each step is. Use tools that help you move around (mobility aids) if they are needed. These include: Canes. Walkers. Scooters. Crutches. Turn on the lights when you go into a dark area. Replace any light  bulbs as soon as they burn out. Set up your furniture so you have a clear path. Avoid moving your furniture around. If any of your floors are uneven, fix them. If there are any pets around you, be aware of where they are. Review your medicines with your doctor. Some medicines can make you feel dizzy. This can increase your chance of falling. Ask your doctor what other things that you can do to help prevent falls. This information is not intended to replace advice given to you by your health care provider. Make sure you discuss any questions you have with your health care provider. Document Released: 11/04/2008 Document Revised: 06/16/2015 Document Reviewed: 02/12/2014 Elsevier Interactive Patient Education  2017 [employer].

## 2020-09-06 NOTE — Progress Notes (Signed)
This service is provided via telemedicine  No vital signs collected/recorded due to the encounter was a telemedicine visit.   Location of patient (ex: home, work):  Home  Patient consents to a telephone visit:  Yes  Location of the provider (ex: office, home):  [employer].  Name of any referring provider:  Adja Ruff, Nelda Bucks, NP   Names of all persons participating in the telemedicine service and their role in the encounter:  Patient, Spouse [name] Madlyn Frankel, RMA, Carleena Mires, NP.    Time spent on call: 8 minutes spent on the phone with Medical Assistant.     Subjective:   Kirk [name] is a 60 y.o. male who presents for Medicare Annual/Subsequent preventive examination.  Review of Systems    Cardiac Risk Factors include: advanced age (>22men, >43 women);male gender;smoking/ tobacco exposure;obesity (BMI >30kg/m2);sedentary lifestyle     Objective:    Today's Vitals   09/06/20 1208  PainSc: 10-Worst pain ever   There is no height or weight on file to calculate BMI.  Advanced Directives 09/06/2020 08/24/2020 06/24/2020 05/13/2020 10/22/2019 08/04/2019 06/18/2019  Does Patient Have a Medical Advance Directive? No No No No No No No  Does patient want to make changes to medical advance directive? - - No - Patient declined No - Patient declined - No - Patient declined No - Patient declined  Would patient like information on creating a medical advance directive? No - Patient declined No - Patient declined - - No - Patient declined - -  Pre-existing out of facility DNR order (yellow form or pink MOST form) - - - - - - -    Current Medications (verified) Outpatient Encounter Medications as of 09/06/2020  Medication Sig   acetaminophen (TYLENOL) 500 MG tablet Take 1 tablet (500 mg total) by mouth every 6 (six) hours as needed for moderate pain.   albuterol (PROVENTIL HFA;VENTOLIN HFA) 108 (90 Base) MCG/ACT inhaler Inhale 2 puffs into the lungs every 6 (six)  hours as needed for wheezing or shortness of breath.   amLODipine (NORVASC) 10 MG tablet TAKE 1 TABLET BY MOUTH EVERY DAY   aspirin EC 81 MG tablet Take 81 mg by mouth daily.   b complex-vitamin c-folic acid (NEPHRO-VITE) 0.8 MG TABS tablet TAKE 1 TABLET BY MOUTH EVERY DAY   budesonide-formoterol (SYMBICORT) 160-4.5 MCG/ACT inhaler Inhale 2 puffs into the lungs 2 (two) times daily.   calcitRIOL (ROCALTROL) 0.5 MCG capsule Take 2 capsules (1 mcg total) by mouth every Monday, Wednesday, and Friday with hemodialysis.   chlorhexidine (PERIDEX) 0.12 % solution SMARTSIG:By Mouth   Cholecalciferol (VITAMIN D3) 25 MCG (1000 UT) CAPS Take 4,000 Units by mouth daily.   cinacalcet (SENSIPAR) 90 MG tablet Take 90 mg by mouth every evening.   clonazePAM (KLONOPIN) 0.5 MG tablet TAKE 1 TABLET BY MOUTH 2 TIMES DAILY AS NEEDED FOR ANXIETY.   cloNIDine (CATAPRES) 0.1 MG tablet Take 0.1 mg by mouth at bedtime.   cyclobenzaprine (FLEXERIL) 5 MG tablet TAKE 1 TABLET BY MOUTH AT BEDTIME AS NEEDED FOR MUSCLE SPASMS.   diphenhydrAMINE (BENADRYL) 25 MG tablet Take 50 mg by mouth at bedtime.   doxazosin (CARDURA) 4 MG tablet Take 4 mg by mouth daily.   DULoxetine (CYMBALTA) 30 MG capsule Take 1 capsule (30 mg total) by mouth daily.   famotidine (PEPCID) 20 MG tablet Take 1 tablet (20 mg total) by mouth at bedtime.   gabapentin (NEURONTIN) 100 MG capsule Take 1 capsule (100 mg total)  by mouth 2 (two) times daily.   guaiFENesin (MUCINEX) 600 MG 12 hr tablet Take 1,200 mg by mouth 2 (two) times daily.   hydrALAZINE (APRESOLINE) 100 MG tablet TAKE 1 TABLET EVERY MORNINGAND AT BEDTIME.   ipratropium-albuterol (DUONEB) 0.5-2.5 (3) MG/3ML SOLN Take 3 mLs by nebulization every 4 (four) hours as needed (Shortness of breath).   isosorbide mononitrate (IMDUR) 30 MG 24 hr tablet TAKE 1 TABLET DAILY   lidocaine-prilocaine (EMLA) cream Apply 1 application topically See admin instructions. Apply 1 to 2 hours prior to dialysis on  Mondays, Wednesdays, and Fridays. Cover with saran wrap.   metoprolol tartrate (LOPRESSOR) 100 MG tablet TAKE 1 TABLET BY MOUTH TWICE A DAY   omeprazole (PRILOSEC) 20 MG capsule TAKE 1 CAPSULE TWICE DAILY BEFORE A MEAL   ondansetron (ZOFRAN ODT) 4 MG disintegrating tablet Take 1 tablet (4 mg total) by mouth every 8 (eight) hours as needed for nausea or vomiting.   pravastatin (PRAVACHOL) 40 MG tablet TAKE 1 TABLET BY MOUTH EVERY DAY IN THE EVENING   PRESCRIPTION MEDICATION See admin instructions. CPAP- At bedtime and during any time of rest   RENVELA 800 MG tablet Take 800-2,400 mg by mouth See admin instructions. Take 2,400 mg by mouth three times a day with meals and 800 mg with each snack   traMADol (ULTRAM) 50 MG tablet Take 1 tablet (50 mg total) by mouth daily as needed.   [DISCONTINUED] buPROPion (WELLBUTRIN XL) 150 MG 24 hr tablet Take 1 tablet (150 mg total) by mouth every Monday, Wednesday, and Friday for 7 days.   No facility-administered encounter medications on file as of 09/06/2020.    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)    Anxiety    Asthma    Asthma    moderate persistent   Carpal tunnel syndrome    Cellulitis and abscess of trunk    Cholelithiasis 07/13/2014   Chronic headaches    Debility, unspecified    Dermatophytosis of the body    Dysrhythmia    history of   Edema    End stage renal disease on dialysis [facility])    "MWF; [facility]" (10/21/2014)   Essential hypertension, benign    GERD (gastroesophageal reflux disease)    Gout, unspecified    HTN (hypertension)    Hypertrophy of prostate without urinary obstruction and other lower urinary tract symptoms (LUTS)    Hypotension, unspecified    Impotence of organic origin    Insomnia, unspecified     Kidney replaced by transplant    Localization-related (focal) (partial) epilepsy and epileptic syndromes with complex partial seizures, without mention of intractable epilepsy    12-15-19- Wife states he has NEVER had a seizure    Lumbago    Memory loss    OSA on CPAP    Other and unspecified hyperlipidemia    controlled /managed per wife    Other chronic nonalcoholic liver disease    Other malaise and fatigue    Other nonspecific abnormal serum enzyme levels    Pain in joint, lower leg    Pain in joint, upper arm    Pneumonia "several times"   Renal dialysis status(V45.11) 02/05/2010   restarted 01/02/13 ofter renal trransplant failure   Secondary hyperparathyroidism (of renal origin)  Shortness of breath    Sleep apnea    wears cpap    Tension headache    Unspecified constipation    Unspecified essential hypertension    Unspecified hereditary and idiopathic peripheral neuropathy    Unspecified vitamin D deficiency    Past Surgical History:  Procedure Laterality Date   AV FISTULA PLACEMENT Left ?2010   "forearm; at [place]"   [place]  03/21/2011   CHOLECYSTECTOMY N/A 10/21/2014   Procedure: LAPAROSCOPIC CHOLECYSTECTOMY WITH INTRAOPERATIVE CHOLANGIOGRAM;  Surgeon: Autumn Messing III, MD;  Location: [place];  Service: General;  Laterality: N/A;   COLONOSCOPY     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   KIDNEY TRANSPLANT  08/17/2011   [place]  10/21/2014   w/IOC   LEFT HEART CATHETERIZATION WITH CORONARY ANGIOGRAM N/A 03/21/2011   Procedure: LEFT HEART CATHETERIZATION WITH CORONARY ANGIOGRAM;  Surgeon: [name], MD;  Location: [facility] CATH LAB;  Service: Cardiovascular;  Laterality: N/A;   NEPHRECTOMY  08/2013   removed transplaned kidney   POLYPECTOMY     POSTERIOR FUSION CERVICAL SPINE  06/25/2012   for spinal stenosis   VASECTOMY  2010   Family History  Adopted: Yes  Problem  Relation Age of Onset   Colon cancer Neg Hx    Esophageal cancer Neg Hx    Rectal cancer Neg Hx    Stomach cancer Neg Hx    Colon polyps Neg Hx    Social History   Socioeconomic History   Marital status: Married    Spouse name: [name]   Number of children: 3   Years of education: Not on file   Highest education level: Not on file  Occupational History   Occupation: disabled    Employer: DISABLED  Tobacco Use   Smoking status: Former    Packs/day: 0.50    Years: 32.00    Pack years: 16.00    Types: Cigarettes    Start date: 1993    Quit date: 02/24/2020    Years since quitting: 0.5   Smokeless tobacco: Never  Vaping Use   Vaping Use: Never used  Substance and Sexual Activity   Alcohol use: No    Alcohol/week: 0.0 standard drinks   Drug use: No   Sexual activity: Yes  Other Topics Concern   Not on file  Social History Narrative   Drinks 1 cup of caffeine daily.   Social Determinants of Health   Financial Resource Strain: Not on file  Food Insecurity: Not on file  Transportation Needs: Not on file  Physical Activity: Not on file  Stress: Not on file  Social Connections: Not on file    Tobacco Counseling Counseling given: Not Answered   Clinical Intake:  Pre-visit preparation completed: No  Pain : 0-10 Pain Score: 10-Worst pain ever Pain Type: Chronic pain Pain Location: Back Pain Orientation: Other (Comment) (back,arm) Pain Radiating Towards: legs and left arm Pain Descriptors / Indicators: Aching, Burning, Numbness, Sharp Pain Onset: Other (comment) ([id number]) Pain Frequency: Constant Pain Relieving Factors: tylenol eases but does not take the pain away Effect of Pain on Daily Activities: sleeping,walking or standing  Pain Relieving Factors: tylenol eases but does not take the pain away  BMI - recorded: 33.9 Nutritional Status: BMI > 30  Obese Nutritional Risks: None Diabetes: No  How often do you need to have someone help you when you read  instructions, pamphlets,  or other written materials from your doctor or pharmacy?: 3 - Sometimes (small writing) What is the last grade level you completed in school?: diploma  Diabetic?No   Interpreter Needed?: No  Information entered by :: Fredie Majano,FNP-C   Activities of Daily Living In your present state of health, do you have any difficulty performing the following activities: 09/06/2020  Hearing? Y  Vision? Y  Difficulty concentrating or making decisions? Y  Comment remembering  Walking or climbing stairs? Y  Comment back pain  Dressing or bathing? N  Doing errands, shopping? Y  Comment wife assist  Using the Toilet? N  In the past six months, have you accidently leaked urine? N  Do you have problems with loss of bowel control? N  Managing your Medications? Y  Comment wife assist states cannot keep up with all the medication  Managing your Finances? N  Housekeeping or managing your Housekeeping? Y  Comment Has to rest frequently in between chores  Some recent data might be hidden    Patient Care Team: Nadina Fomby, Nelda Bucks, NP as PCP - General (Family Medicine) Debara Pickett Nadean Corwin, MD as PCP - Cardiology (Cardiology) Jovita Kussmaul, MD as Consulting Physician (General Surgery) [name], MD as Consulting Physician (Pulmonary Disease) Estanislado Emms, MD (Inactive) as Consulting Physician (Nephrology) Elam Dutch, MD as Consulting Physician (Vascular Surgery) [name], MD as Consulting Physician (Cardiology) Milus Banister, MD as Attending Physician (Gastroenterology) Jamal Maes, MD as Consulting Physician (Nephrology)  Indicate any recent Medical Services you may have received from other than [facility] providers in the past year (date may be approximate).     Assessment:   This is a routine wellness examination for [place].  Hearing/Vision screen Hearing Screening - Comments:: No Hearing Concerns. Vision Screening - Comments:: No Vision Concerns.  Patient doesn't wear prescription glasses.  Dietary issues and exercise activities discussed: Current Exercise Habits: The patient does not participate in regular exercise at present, Exercise limited by: orthopedic condition(s) (back pain)   Goals Addressed             This Visit's Progress    Patient Stated       Would like to recovery from pain to go back to what he used to do        Depression Screen PHQ 2/9 Scores 09/06/2020 10/22/2019 06/18/2019 02/05/2019 09/25/2018 05/22/2018 02/03/2018  PHQ - 2 Score 0 0 0 0 0 0 0  PHQ- 9 Score - - - - - - -    Fall Risk Fall Risk  09/06/2020 08/24/2020 06/24/2020 05/13/2020 04/28/2020  Falls in the past year? 0 0 1 0 0  Number falls in past yr: 0 0 0 0 0  Injury with Fall? 0 0 0 0 0  Comment - - - - -  Risk for fall due to : No Fall Risks No Fall Risks - - -  Follow up Falls evaluation completed Falls evaluation completed - - -    FALL RISK PREVENTION PERTAINING TO THE HOME:  Any stairs in or around the home? Yes  If so, are there any without handrails? Yes  Home free of loose throw rugs in walkways, pet beds, electrical cords, etc? No  Adequate lighting in your home to reduce risk of falls? No   ASSISTIVE DEVICES UTILIZED TO PREVENT FALLS:  Life alert? No  Use of a cane, walker or w/c? Yes  Grab bars in the bathroom? No  Shower chair or bench in  shower? No  Elevated toilet seat or a handicapped toilet? No   TIMED UP AND GO:  Was the test performed? No .  Length of time to ambulate 10 feet: N/A sec.   Gait slow and steady with assistive device  Cognitive Function:     6CIT Screen 09/06/2020  What Year? 0 points  What month? 0 points  What time? 0 points  Count back from 20 0 points  Months in reverse 0 points  Repeat phrase 6 points  Total Score 6    Immunizations Immunization History  Administered Date(s) Administered   Influenza Whole 11/01/2010   Influenza, Seasonal, Injecte, Preservative Fre 10/29/2012    Influenza-Unspecified 11/21/2011, 11/05/2012, 09/22/2013, 09/22/2017, 11/23/2019   Moderna Sars-Covid-2 Vaccination 04/06/2019, 05/07/2019, 03/31/2020   PPD Test 12/26/2012   Pneumococcal Polysaccharide-23 11/01/2010, 10/29/2012   Pneumococcal-Unspecified 11/05/2012   Tdap 01/22/2006    TDAP status: Due, Education has been provided regarding the importance of this vaccine. Advised may receive this vaccine at local pharmacy or Health Dept. Aware to provide a copy of the vaccination record if obtained from local pharmacy or Health Dept. Verbalized acceptance and understanding.  Flu Vaccine status: Due, Education has been provided regarding the importance of this vaccine. Advised may receive this vaccine at local pharmacy or Health Dept. Aware to provide a copy of the vaccination record if obtained from local pharmacy or Health Dept. Verbalized acceptance and understanding.  Pneumococcal vaccine status: Due, Education has been provided regarding the importance of this vaccine. Advised may receive this vaccine at local pharmacy or Health Dept. Aware to provide a copy of the vaccination record if obtained from local pharmacy or Health Dept. Verbalized acceptance and understanding.  Covid-19 vaccine status: Information provided on how to obtain vaccines.   Qualifies for Shingles Vaccine? Yes   Zostavax completed No   Shingrix Completed?: No.    Education has been provided regarding the importance of this vaccine. Patient has been advised to call insurance company to determine out of pocket expense if they have not yet received this vaccine. Advised may also receive vaccine at local pharmacy or Health Dept. Verbalized acceptance and understanding.  Screening Tests Health Maintenance  Topic Date Due   Zoster Vaccines- Shingrix (1 of 2) Never done   Pneumococcal Vaccine 85-56 Years old (3 - PCV) 11/05/2013   TETANUS/TDAP  01/23/2016   HEMOGLOBIN A1C  04/20/2020   COVID-19 Vaccine (4 - Booster for  Moderna series) 07/01/2020   INFLUENZA VACCINE  08/22/2020   COLONOSCOPY (Pts 45-9yrs Insurance coverage will need to be confirmed)  12/29/2026   PNEUMOCOCCAL POLYSACCHARIDE VACCINE AGE 61-64 HIGH RISK  Completed   Hepatitis C Screening  Completed   HIV Screening  Completed   HPV VACCINES  Aged Out   FOOT EXAM  Discontinued   OPHTHALMOLOGY EXAM  Discontinued    Health Maintenance  Health Maintenance Due  Topic Date Due   Zoster Vaccines- Shingrix (1 of 2) Never done   Pneumococcal Vaccine 64-89 Years old (3 - PCV) 11/05/2013   TETANUS/TDAP  01/23/2016   HEMOGLOBIN A1C  04/20/2020   COVID-19 Vaccine (4 - Booster for Moderna series) 07/01/2020   INFLUENZA VACCINE  08/22/2020    Colorectal cancer screening: Type of screening: Colonoscopy. Completed 12/29/2026. Repeat every 10 years  Lung Cancer Screening: (Low Dose CT Chest recommended if Age 70-80 years, 30 pack-year currently smoking OR have quit w/in 15years.) does qualify.   Lung Cancer Screening Referral:scheduled february,2023   Additional Screening:  Hepatitis  C Screening: does not qualify; Completed Yes   Vision Screening: Recommended annual ophthalmology exams for early detection of glaucoma and other disorders of the eye. Is the patient up to date with their annual eye exam?  No  Who is the provider or what is the name of the office in which the patient attends annual eye exams? My eye doctor  If pt is not established with a provider, would they like to be referred to a provider to establish care? No .   Dental Screening: Recommended annual dental exams for proper oral hygiene  Community Resource Referral / Chronic Care Management: CRR required this visit?  No   CCM required this visit?  No      Plan:     I have personally reviewed and noted the following in the patient's chart:   Medical and social history Use of alcohol, tobacco or illicit drugs  Current medications and supplements including opioid  prescriptions. Patient is not currently taking opioid prescriptions. Functional ability and status Nutritional status Physical activity Advanced directives List of other physicians Hospitalizations, surgeries, and ER visits in previous 12 months Vitals Screenings to include cognitive, depression, and falls Referrals and appointments  In addition, I have reviewed and discussed with patient certain preventive protocols, quality metrics, and best practice recommendations. A written personalized care plan for preventive services as well as general preventive health recommendations were provided to patient.     Sandrea Hughs, NP   09/06/2020   Nurse Notes:Tdap script send to pharmacy on previous visit.wife will arrange to get it at the pharmacy.  - will get Flu vaccine at dialysis Center  - wife will schedule for 2 nd COVID-19 vaccine

## 2020-09-07 DIAGNOSIS — N186 End stage renal disease: Secondary | ICD-10-CM | POA: Diagnosis not present

## 2020-09-07 DIAGNOSIS — D631 Anemia in chronic kidney disease: Secondary | ICD-10-CM | POA: Diagnosis not present

## 2020-09-07 DIAGNOSIS — Z992 Dependence on renal dialysis: Secondary | ICD-10-CM | POA: Diagnosis not present

## 2020-09-07 DIAGNOSIS — N2581 Secondary hyperparathyroidism of renal origin: Secondary | ICD-10-CM | POA: Diagnosis not present

## 2020-09-08 ENCOUNTER — Telehealth: Payer: Self-pay | Admitting: *Deleted

## 2020-09-08 ENCOUNTER — Ambulatory Visit (HOSPITAL_COMMUNITY): Payer: Medicare Other | Attending: Cardiology

## 2020-09-08 ENCOUNTER — Other Ambulatory Visit: Payer: Self-pay

## 2020-09-08 DIAGNOSIS — I350 Nonrheumatic aortic (valve) stenosis: Secondary | ICD-10-CM | POA: Diagnosis not present

## 2020-09-08 LAB — ECHOCARDIOGRAM COMPLETE
AR max vel: 1.4 cm2
AV Area VTI: 1.47 cm2
AV Area mean vel: 1.11 cm2
AV Mean grad: 7 mmHg
AV Peak grad: 12.8 mmHg
Ao pk vel: 1.79 m/s
Area-P 1/2: 3.12 cm2
S' Lateral: 3.1 cm

## 2020-09-08 MED ORDER — PERFLUTREN LIPID MICROSPHERE
1.0000 mL | INTRAVENOUS | Status: AC | PRN
  Administered 2020-09-08: 3 mL via INTRAVENOUS

## 2020-09-08 NOTE — Telephone Encounter (Signed)
Called left message on voicemail - message left that  result are available in mychart for review. May call back for verbal result

## 2020-09-08 NOTE — Telephone Encounter (Signed)
-----   Message from Loel Dubonnet, NP sent at 09/08/2020  1:40 PM EDT ----- Echocardiogram shows normal heart pumping function. Stable mild aortic stenosis. Good result. Continue current medications and follow up as scheduled.

## 2020-09-09 DIAGNOSIS — D631 Anemia in chronic kidney disease: Secondary | ICD-10-CM | POA: Diagnosis not present

## 2020-09-09 DIAGNOSIS — N186 End stage renal disease: Secondary | ICD-10-CM | POA: Diagnosis not present

## 2020-09-09 DIAGNOSIS — N2581 Secondary hyperparathyroidism of renal origin: Secondary | ICD-10-CM | POA: Diagnosis not present

## 2020-09-09 DIAGNOSIS — Z992 Dependence on renal dialysis: Secondary | ICD-10-CM | POA: Diagnosis not present

## 2020-09-12 ENCOUNTER — Telehealth: Payer: Self-pay | Admitting: *Deleted

## 2020-09-12 DIAGNOSIS — D631 Anemia in chronic kidney disease: Secondary | ICD-10-CM | POA: Diagnosis not present

## 2020-09-12 DIAGNOSIS — N2581 Secondary hyperparathyroidism of renal origin: Secondary | ICD-10-CM | POA: Diagnosis not present

## 2020-09-12 DIAGNOSIS — Z992 Dependence on renal dialysis: Secondary | ICD-10-CM | POA: Diagnosis not present

## 2020-09-12 DIAGNOSIS — N186 End stage renal disease: Secondary | ICD-10-CM | POA: Diagnosis not present

## 2020-09-12 NOTE — Telephone Encounter (Signed)
Received Surgery Clearance form from [facility] [phone] Fax: [phone]  Procedure: Cervical Fusion Placed in Kirk [name]'s folder to review and sign.

## 2020-09-13 ENCOUNTER — Telehealth: Payer: Self-pay | Admitting: Family

## 2020-09-13 NOTE — Telephone Encounter (Signed)
Pre-op clearance received from [facility] for planned cervical spine surgery.patient is moderate risk due to Co-morbidities.Hold Asprin 7 days prior to surgery.surgery clearance given to Kathyrn Lass front desk to fax to [facility].

## 2020-09-14 DIAGNOSIS — N2581 Secondary hyperparathyroidism of renal origin: Secondary | ICD-10-CM | POA: Diagnosis not present

## 2020-09-14 DIAGNOSIS — N186 End stage renal disease: Secondary | ICD-10-CM | POA: Diagnosis not present

## 2020-09-14 DIAGNOSIS — Z992 Dependence on renal dialysis: Secondary | ICD-10-CM | POA: Diagnosis not present

## 2020-09-14 DIAGNOSIS — D631 Anemia in chronic kidney disease: Secondary | ICD-10-CM | POA: Diagnosis not present

## 2020-09-16 DIAGNOSIS — Z992 Dependence on renal dialysis: Secondary | ICD-10-CM | POA: Diagnosis not present

## 2020-09-16 DIAGNOSIS — N2581 Secondary hyperparathyroidism of renal origin: Secondary | ICD-10-CM | POA: Diagnosis not present

## 2020-09-16 DIAGNOSIS — D631 Anemia in chronic kidney disease: Secondary | ICD-10-CM | POA: Diagnosis not present

## 2020-09-16 DIAGNOSIS — N186 End stage renal disease: Secondary | ICD-10-CM | POA: Diagnosis not present

## 2020-09-19 DIAGNOSIS — D631 Anemia in chronic kidney disease: Secondary | ICD-10-CM | POA: Diagnosis not present

## 2020-09-19 DIAGNOSIS — N2581 Secondary hyperparathyroidism of renal origin: Secondary | ICD-10-CM | POA: Diagnosis not present

## 2020-09-19 DIAGNOSIS — N186 End stage renal disease: Secondary | ICD-10-CM | POA: Diagnosis not present

## 2020-09-19 DIAGNOSIS — Z992 Dependence on renal dialysis: Secondary | ICD-10-CM | POA: Diagnosis not present

## 2020-09-20 ENCOUNTER — Ambulatory Visit
Admission: RE | Admit: 2020-09-20 | Discharge: 2020-09-20 | Disposition: A | Discharge status: Home / Self Care | Payer: Medicare Other | Source: Ambulatory Visit | Attending: Adult Health | Admitting: Adult Health

## 2020-09-20 ENCOUNTER — Other Ambulatory Visit: Payer: Self-pay

## 2020-09-20 ENCOUNTER — Other Ambulatory Visit: Payer: Self-pay | Admitting: Internal Medicine

## 2020-09-20 ENCOUNTER — Ambulatory Visit (INDEPENDENT_AMBULATORY_CARE_PROVIDER_SITE_OTHER): Payer: Medicare Other | Admitting: Adult Health

## 2020-09-20 ENCOUNTER — Other Ambulatory Visit: Payer: Self-pay | Admitting: Adult Health

## 2020-09-20 ENCOUNTER — Encounter: Payer: Self-pay | Admitting: Adult Health

## 2020-09-20 VITALS — BP 142/68 | HR 68 | Temp 98.1°F | Ht 67.0 in | Wt 217.4 lb

## 2020-09-20 DIAGNOSIS — W19XXXA Unspecified fall, initial encounter: Secondary | ICD-10-CM

## 2020-09-20 DIAGNOSIS — H9221 Otorrhagia, right ear: Secondary | ICD-10-CM

## 2020-09-20 DIAGNOSIS — R52 Pain, unspecified: Secondary | ICD-10-CM | POA: Diagnosis not present

## 2020-09-20 DIAGNOSIS — Z981 Arthrodesis status: Secondary | ICD-10-CM | POA: Diagnosis not present

## 2020-09-20 DIAGNOSIS — M25551 Pain in right hip: Secondary | ICD-10-CM | POA: Diagnosis not present

## 2020-09-20 DIAGNOSIS — M25511 Pain in right shoulder: Secondary | ICD-10-CM | POA: Diagnosis not present

## 2020-09-20 DIAGNOSIS — I10 Essential (primary) hypertension: Secondary | ICD-10-CM

## 2020-09-20 DIAGNOSIS — R0781 Pleurodynia: Secondary | ICD-10-CM | POA: Diagnosis not present

## 2020-09-20 NOTE — Progress Notes (Addendum)
[facility]  Provider:   Durenda Age  DNP  Code Status:  Full Code  Goals of Care:  Advanced Directives 09/20/2020  Does Patient Have a Medical Advance Directive? No  Does patient want to make changes to medical advance directive? -  Would patient like information on creating a medical advance directive? No - Patient declined  Pre-existing out of facility DNR order (yellow form or pink MOST form) -     Chief Complaint  Patient presents with   Acute Visit    Patient returns to the office after a fall a week ago. Patient having some right ear bleeding and bruising on back.     HPI: Patient is a 60 y.o. male seen today for an acute visit for pain. He fell a week ago at the back step of his house. He fell on his right side without loss of consciousness. He was able to stand up on his own and told his wife about the fall. He was able to ambulate but wife noticed that he does not walk as much. Wife was with him today. Patient complains of pain, 10/10, on his right shoulder, right rib cage and right hip. He is able to move RUE but has pain on the right shoulder. Right posterior back with bruising. No SOB nor wheezing. Wife reported that his right ear would be bleeding occasionally.  No noted bleeding on right TM.   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)    Anxiety    Asthma    Asthma    moderate persistent   Carpal tunnel syndrome    Cellulitis and abscess of trunk    Cholelithiasis 07/13/2014   Chronic headaches    Debility, unspecified    Dermatophytosis of the body    Dysrhythmia    history of   Edema    End stage renal disease on dialysis [facility])    "MWF; [facility]" (10/21/2014)   Essential hypertension, benign    GERD (gastroesophageal reflux disease)    Gout, unspecified    HTN (hypertension)     Hypertrophy of prostate without urinary obstruction and other lower urinary tract symptoms (LUTS)    Hypotension, unspecified    Impotence of organic origin    Insomnia, unspecified    Kidney replaced by transplant    Localization-related (focal) (partial) epilepsy and epileptic syndromes with complex partial seizures, without mention of intractable epilepsy    12-15-19- Wife states he has NEVER had a seizure    Lumbago    Memory loss    OSA on CPAP    Other and unspecified hyperlipidemia    controlled /managed per wife    Other chronic nonalcoholic liver disease    Other malaise and fatigue    Other nonspecific abnormal serum enzyme levels    Pain in joint, lower leg    Pain in joint, upper arm    Pneumonia "several times"   Renal dialysis status(V45.11) 02/05/2010   restarted 01/02/13 ofter renal trransplant failure   Secondary hyperparathyroidism (of renal origin)    Shortness of breath    Sleep apnea    wears cpap    Tension headache    Unspecified constipation    Unspecified essential hypertension    Unspecified hereditary and idiopathic peripheral neuropathy    Unspecified  vitamin D deficiency     Past Surgical History:  Procedure Laterality Date   AV FISTULA PLACEMENT Left ?2010   "forearm; at [place]"   [place]  03/21/2011   CHOLECYSTECTOMY N/A 10/21/2014   Procedure: LAPAROSCOPIC CHOLECYSTECTOMY WITH INTRAOPERATIVE CHOLANGIOGRAM;  Surgeon: Autumn Messing III, MD;  Location: [place];  Service: General;  Laterality: N/A;   COLONOSCOPY     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   KIDNEY TRANSPLANT  08/17/2011   [place]  10/21/2014   w/IOC   LEFT HEART CATHETERIZATION WITH CORONARY ANGIOGRAM N/A 03/21/2011   Procedure: LEFT HEART CATHETERIZATION WITH CORONARY ANGIOGRAM;  Surgeon: [name], MD;  Location: [facility] CATH LAB;  Service: Cardiovascular;  Laterality: N/A;    NEPHRECTOMY  08/2013   removed transplaned kidney   POLYPECTOMY     POSTERIOR FUSION CERVICAL SPINE  06/25/2012   for spinal stenosis   VASECTOMY  2010    Allergies  Allergen Reactions   Codeine Nausea And Vomiting    Outpatient Encounter Medications as of 09/20/2020  Medication Sig   acetaminophen (TYLENOL) 500 MG tablet Take 1 tablet (500 mg total) by mouth every 6 (six) hours as needed for moderate pain.   albuterol (PROVENTIL HFA;VENTOLIN HFA) 108 (90 Base) MCG/ACT inhaler Inhale 2 puffs into the lungs every 6 (six) hours as needed for wheezing or shortness of breath.   amLODipine (NORVASC) 10 MG tablet TAKE 1 TABLET BY MOUTH EVERY DAY   aspirin EC 81 MG tablet Take 81 mg by mouth daily.   b complex-vitamin c-folic acid (NEPHRO-VITE) 0.8 MG TABS tablet TAKE 1 TABLET BY MOUTH EVERY DAY   budesonide-formoterol (SYMBICORT) 160-4.5 MCG/ACT inhaler Inhale 2 puffs into the lungs 2 (two) times daily.   calcitRIOL (ROCALTROL) 0.5 MCG capsule Take 2 capsules (1 mcg total) by mouth every Monday, Wednesday, and Friday with hemodialysis.   chlorhexidine (PERIDEX) 0.12 % solution SMARTSIG:By Mouth   Cholecalciferol (VITAMIN D3) 25 MCG (1000 UT) CAPS Take 4,000 Units by mouth daily.   cinacalcet (SENSIPAR) 90 MG tablet Take 90 mg by mouth every evening.   cloNIDine (CATAPRES) 0.1 MG tablet Take 0.1 mg by mouth at bedtime.   cyclobenzaprine (FLEXERIL) 5 MG tablet TAKE 1 TABLET BY MOUTH AT BEDTIME AS NEEDED FOR MUSCLE SPASMS.   diphenhydrAMINE (BENADRYL) 25 MG tablet Take 50 mg by mouth at bedtime.   doxazosin (CARDURA) 4 MG tablet Take 4 mg by mouth daily.   DULoxetine (CYMBALTA) 30 MG capsule Take 1 capsule (30 mg total) by mouth daily.   famotidine (PEPCID) 20 MG tablet Take 1 tablet (20 mg total) by mouth at bedtime.   gabapentin (NEURONTIN) 100 MG capsule Take 1 capsule (100 mg total) by mouth 2 (two) times daily.   guaiFENesin (MUCINEX) 600 MG 12 hr tablet Take 1,200 mg by mouth 2 (two) times  daily.   hydrALAZINE (APRESOLINE) 100 MG tablet TAKE 1 TABLET EVERY MORNINGAND AT BEDTIME.   ipratropium-albuterol (DUONEB) 0.5-2.5 (3) MG/3ML SOLN Take 3 mLs by nebulization every 4 (four) hours as needed (Shortness of breath).   isosorbide mononitrate (IMDUR) 30 MG 24 hr tablet TAKE 1 TABLET DAILY   lidocaine-prilocaine (EMLA) cream Apply 1 application topically See admin instructions. Apply 1 to 2 hours prior to dialysis on Mondays, Wednesdays, and Fridays. Cover with saran wrap.   metoprolol tartrate (LOPRESSOR) 100 MG tablet TAKE 1 TABLET BY MOUTH TWICE  A DAY   omeprazole (PRILOSEC) 20 MG capsule TAKE 1 CAPSULE TWICE DAILY BEFORE A MEAL   ondansetron (ZOFRAN ODT) 4 MG disintegrating tablet Take 1 tablet (4 mg total) by mouth every 8 (eight) hours as needed for nausea or vomiting.   pravastatin (PRAVACHOL) 40 MG tablet TAKE 1 TABLET BY MOUTH EVERY DAY IN THE EVENING   PRESCRIPTION MEDICATION See admin instructions. CPAP- At bedtime and during any time of rest   RENVELA 800 MG tablet Take 800-2,400 mg by mouth See admin instructions. Take 2,400 mg by mouth three times a day with meals and 800 mg with each snack   traMADol (ULTRAM) 50 MG tablet Take 1 tablet (50 mg total) by mouth daily as needed.   [DISCONTINUED] clonazePAM (KLONOPIN) 0.5 MG tablet TAKE 1 TABLET BY MOUTH 2 TIMES DAILY AS NEEDED FOR ANXIETY.   No facility-administered encounter medications on file as of 09/20/2020.    Review of Systems:  Review of Systems  Constitutional:  Positive for activity change. Negative for appetite change, chills and fever.  HENT:  Negative for congestion.   Eyes:  Negative for discharge.  Respiratory:  Negative for cough, chest tightness and shortness of breath.   Gastrointestinal:  Negative for abdominal pain.  Endocrine: Negative.   Genitourinary:        Anuric  Neurological:  Positive for dizziness and weakness.  Psychiatric/Behavioral: Negative.     Health Maintenance  Topic Date Due    Zoster Vaccines- Shingrix (1 of 2) Never done   Pneumococcal Vaccine 79-49 Years old (3 - PCV) 11/05/2013   TETANUS/TDAP  01/23/2016   COVID-19 Vaccine (4 - Booster for Moderna series) 07/01/2020   INFLUENZA VACCINE  08/22/2020   HEMOGLOBIN A1C  10/07/2020 (Originally 04/20/2020)   COLONOSCOPY (Pts 45-6yrs Insurance coverage will need to be confirmed)  12/29/2026   PNEUMOCOCCAL POLYSACCHARIDE VACCINE AGE 24-64 HIGH RISK  Completed   Hepatitis C Screening  Completed   HIV Screening  Completed   HPV VACCINES  Aged Out   FOOT EXAM  Discontinued   OPHTHALMOLOGY EXAM  Discontinued    Physical Exam: Vitals:   09/20/20 1533  BP: (!) 142/68  Pulse: 68  Temp: 98.1 F (36.7 C)  SpO2: 98%  Weight: 217 lb 6.4 oz (98.6 kg)  Height: 5\' 7"  (1.702 m)   Body mass index is 34.05 kg/m. Physical Exam Constitutional:      Appearance: He is obese.  HENT:     Head: Normocephalic and atraumatic.     Comments: No noted bleeding nor dry blood on right ear.    Right Ear: Tympanic membrane normal.  Cardiovascular:     Rate and Rhythm: Normal rate and regular rhythm.  Abdominal:     Palpations: Abdomen is soft.  Musculoskeletal:        General: Normal range of motion.     Cervical back: Normal range of motion.  Skin:    General: Skin is warm.  Neurological:     General: No focal deficit present.     Mental Status: He is alert.  Psychiatric:        Mood and Affect: Mood normal.        Behavior: Behavior normal.        Thought Content: Thought content normal.    Labs reviewed: Basic Metabolic Panel: Recent Labs    03/03/20 0833 05/06/20 1516  NA  --  136  K  --  3.1*  CL  --  99  CO2  --  28  GLUCOSE  --  106*  BUN  --  18  CREATININE  --  6.51*  CALCIUM  --  9.6  TSH 1.38  --    Liver Function Tests: Recent Labs    05/06/20 1516  AST 34  ALT 30  ALKPHOS 72  BILITOT 0.9  PROT 7.7  ALBUMIN 3.9   No results for input(s): LIPASE, AMYLASE in the last 8760 hours. No results  for input(s): AMMONIA in the last 8760 hours. CBC: Recent Labs    10/22/19 1602 03/03/20 0833 05/06/20 1516  WBC 4.3 4.8 4.5  NEUTROABS 2,829 2,928 2.7  HGB 10.9* 11.8* 12.6*  HCT 32.8* 35.7* 37.5*  MCV 97.6 100.6* 104.7*  PLT 74* 84* 85*   Lipid Panel: Recent Labs    10/22/19 1602 03/03/20 0833  CHOL 90 108  HDL 31* 29*  LDLCALC 44 52  TRIG 74 196*  CHOLHDL 2.9 3.7   Lab Results  Component Value Date   HGBA1C 5.0 10/22/2019    Procedures since last visit: ECHOCARDIOGRAM COMPLETE  Result Date: 09/08/2020    ECHOCARDIOGRAM REPORT   Patient Name:   Kirk [name] Date of Exam: 09/08/2020 Medical Rec #:  [id number]       Height:       67.0 in Accession #:    [id number]      Weight:       217.0 lb Date of Birth:  [birth year]        BSA:          2.094 m Patient Age:    10 years        BP:           140/70 mmHg Patient Gender: M               HR:           55 bpm. Exam Location:  [place] Procedure: 2D Echo and Intracardiac Opacification Agent Indications:    Aortic Stenosis I35.0  History:        Patient has prior history of Echocardiogram examinations, most                 recent 05/07/2019. Risk Factors:Hypertension.  Sonographer:    Diamond Nickel RCS Referring Phys: [phone] [place]  1. Left ventricular ejection fraction, by estimation, is 50 to 55%. The left ventricle has low normal function. The left ventricle has no regional wall motion abnormalities. There is mild concentric left ventricular hypertrophy. Left ventricular diastolic parameters are consistent with Grade I diastolic dysfunction (impaired relaxation).  2. Right ventricular systolic function is normal. The right ventricular size is normal. Tricuspid regurgitation signal is inadequate for assessing PA pressure.  3. The mitral valve is grossly normal. Trivial mitral valve regurgitation. No evidence of mitral stenosis.  4. The aortic valve is tricuspid. Aortic valve regurgitation is not visualized.  Mild aortic valve stenosis. Aortic valve mean gradient measures 7.0 mmHg. Aortic valve Vmax measures 1.79 m/s.  5. The inferior vena cava is normal in size with greater than 50% respiratory variability, suggesting right atrial pressure of 3 mmHg. Comparison(s): No significant change from prior study. EF roughly the same ~55%. Mild AS remains. FINDINGS  Left Ventricle: Left ventricular ejection fraction, by estimation, is 50 to 55%. The left ventricle has low normal function. The left ventricle has no regional wall motion abnormalities. Definity contrast agent was given IV to delineate the left ventricular endocardial borders. The left ventricular internal  cavity size was normal in size. There is mild concentric left ventricular hypertrophy. Left ventricular diastolic parameters are consistent with Grade I diastolic dysfunction (impaired relaxation). Right Ventricle: The right ventricular size is normal. No increase in right ventricular wall thickness. Right ventricular systolic function is normal. Tricuspid regurgitation signal is inadequate for assessing PA pressure. Left Atrium: Left atrial size was normal in size. Right Atrium: Right atrial size was normal in size. Pericardium: There is no evidence of pericardial effusion. Presence of pericardial fat pad. Mitral Valve: The mitral valve is grossly normal. Mild mitral annular calcification. Trivial mitral valve regurgitation. No evidence of mitral valve stenosis. Tricuspid Valve: The tricuspid valve is grossly normal. Tricuspid valve regurgitation is trivial. No evidence of tricuspid stenosis. Aortic Valve: The aortic valve is tricuspid. Aortic valve regurgitation is not visualized. Mild aortic stenosis is present. Aortic valve mean gradient measures 7.0 mmHg. Aortic valve peak gradient measures 12.8 mmHg. Aortic valve area, by VTI measures 1.47 cm. Pulmonic Valve: The pulmonic valve was grossly normal. Pulmonic valve regurgitation is not visualized. No evidence  of pulmonic stenosis. Aorta: The aortic root and ascending aorta are structurally normal, with no evidence of dilitation. Venous: The inferior vena cava is normal in size with greater than 50% respiratory variability, suggesting right atrial pressure of 3 mmHg. IAS/Shunts: The atrial septum is grossly normal.  LEFT VENTRICLE PLAX 2D LVIDd:         4.20 cm  Diastology LVIDs:         3.10 cm  LV e' medial:    5.43 cm/s LV PW:         1.70 cm  LV E/e' medial:  16.4 LV IVS:        1.30 cm  LV e' lateral:   9.37 cm/s LVOT diam:     1.95 cm  LV E/e' lateral: 9.5 LV SV:         63 LV SV Index:   30 LVOT Area:     2.99 cm  RIGHT VENTRICLE RV Basal diam:  3.40 cm RV S prime:     12.60 cm/s TAPSE (M-mode): 2.3 cm LEFT ATRIUM             Index       RIGHT ATRIUM           Index LA diam:        4.00 cm 1.91 cm/m  RA Area:     20.40 cm LA Vol (A2C):   47.0 ml 22.45 ml/m RA Volume:   63.00 ml  30.09 ml/m LA Vol (A4C):   57.5 ml 27.46 ml/m LA Biplane Vol: 53.9 ml 25.75 ml/m  AORTIC VALVE AV Area (Vmax):    1.40 cm AV Area (Vmean):   1.11 cm AV Area (VTI):     1.47 cm AV Vmax:           179.00 cm/s AV Vmean:          123.000 cm/s AV VTI:            0.430 m AV Peak Grad:      12.8 mmHg AV Mean Grad:      7.0 mmHg LVOT Vmax:         84.20 cm/s LVOT Vmean:        45.800 cm/s LVOT VTI:          0.212 m LVOT/AV VTI ratio: 0.49  AORTA Ao Root diam: 3.00 cm MITRAL VALVE MV Area (PHT): 3.12 cm  SHUNTS MV Decel Time: 243 msec    Systemic VTI:  0.21 m MV E velocity: 89.10 cm/s  Systemic Diam: 1.95 cm MV A velocity: 76.30 cm/s MV E/A ratio:  1.17 Eleonore Chiquito MD Electronically signed by Eleonore Chiquito MD Signature Date/Time: 09/08/2020/12:56:35 PM    Final     Assessment/Plan  1. Fall, initial encounter -  no loss of consciousness -  discussed need for walker/Rolator when walking.  2. Pain -   continue Cymbalta, Cyclobenzaprine and PRN Tramadol - DG Shoulder Right; Future - DG Hip Unilat W OR W/O Pelvis 2-3 Views Right;  Future  3. Bleeding from ear, right -  no bleeding on right TM noted today -  will refer to ENT     Labs/tests ordered:  X-ray of right shoulder, right ribs and right hip   Next appt:  10/27/20

## 2020-09-20 NOTE — Telephone Encounter (Signed)
Patient and wife have been made aware:   Kirk Dubonnet, NP  09/08/2020  1:40 PM EDT     Echocardiogram shows normal heart pumping function. Stable mild aortic stenosis. Good result. Continue current medications and follow up as scheduled

## 2020-09-20 NOTE — Telephone Encounter (Signed)
Wife of patient returning call to go over Echo results. Please call back

## 2020-09-20 NOTE — Patient Instructions (Signed)
Fall Prevention in the Home, Adult Falls can cause injuries and can happen to people of all ages. There are many things you can do to make your home safe and to help prevent falls. Ask forhelp when making these changes. What actions can I take to prevent falls? General Instructions Use good lighting in all rooms. Replace any light bulbs that burn out. Turn on the lights in dark areas. Use night-lights. Keep items that you use often in easy-to-reach places. Lower the shelves around your home if needed. Set up your furniture so you have a clear path. Avoid moving your furniture around. Do not have throw rugs or other things on the floor that can make you trip. Avoid walking on wet floors. If any of your floors are uneven, fix them. Add color or contrast paint or tape to clearly mark and help you see: Grab bars or handrails. First and last steps of staircases. Where the edge of each step is. If you use a stepladder: Make sure that it is fully opened. Do not climb a closed stepladder. Make sure the sides of the stepladder are locked in place. Ask someone to hold the stepladder while you use it. Know where your pets are when moving through your home. What can I do in the bathroom?     Keep the floor dry. Clean up any water on the floor right away. Remove soap buildup in the tub or shower. Use nonskid mats or decals on the floor of the tub or shower. Attach bath mats securely with double-sided, nonslip rug tape. If you need to sit down in the shower, use a plastic, nonslip stool. Install grab bars by the toilet and in the tub and shower. Do not use towel bars as grab bars. What can I do in the bedroom? Make sure that you have a light by your bed that is easy to reach. Do not use any sheets or blankets for your bed that hang to the floor. Have a firm chair with side arms that you can use for support when you get dressed. What can I do in the kitchen? Clean up any spills right away. If  you need to reach something above you, use a step stool with a grab bar. Keep electrical cords out of the way. Do not use floor polish or wax that makes floors slippery. What can I do with my stairs? Do not leave any items on the stairs. Make sure that you have a light switch at the top and the bottom of the stairs. Make sure that there are handrails on both sides of the stairs. Fix handrails that are broken or loose. Install nonslip stair treads on all your stairs. Avoid having throw rugs at the top or bottom of the stairs. Choose a carpet that does not hide the edge of the steps on the stairs. Check carpeting to make sure that it is firmly attached to the stairs. Fix carpet that is loose or worn. What can I do on the outside of my home? Use bright outdoor lighting. Fix the edges of walkways and driveways and fix any cracks. Remove anything that might make you trip as you walk through a door, such as a raised step or threshold. Trim any bushes or trees on paths to your home. Check to see if handrails are loose or broken and that both sides of all steps have handrails. Install guardrails along the edges of any raised decks and porches. Clear paths of anything that   can make you trip, such as tools or rocks. Have leaves, snow, or ice cleared regularly. Use sand or salt on paths during winter. Clean up any spills in your garage right away. This includes grease or oil spills. What other actions can I take? Wear shoes that: Have a low heel. Do not wear high heels. Have rubber bottoms. Feel good on your feet and fit well. Are closed at the toe. Do not wear open-toe sandals. Use tools that help you move around if needed. These include: Canes. Walkers. Scooters. Crutches. Review your medicines with your doctor. Some medicines can make you feel dizzy. This can increase your chance of falling. Ask your doctor what else you can do to help prevent falls. Where to find more information Centers  for Disease Control and Prevention, [employer]: www.cdc.gov National Institute on Aging: www.nia.nih.gov Contact a doctor if: You are afraid of falling at home. You feel weak, drowsy, or dizzy at home. You fall at home. Summary There are many simple things that you can do to make your home safe and to help prevent falls. Ways to make your home safe include removing things that can make you trip and installing grab bars in the bathroom. Ask for help when making these changes in your home. This information is not intended to replace advice given to you by your health care provider. Make sure you discuss any questions you have with your healthcare provider. Document Revised: 08/12/2019 Document Reviewed: 08/12/2019 Elsevier Patient Education  2022 Elsevier Inc.  

## 2020-09-21 DIAGNOSIS — Z992 Dependence on renal dialysis: Secondary | ICD-10-CM | POA: Diagnosis not present

## 2020-09-21 DIAGNOSIS — N186 End stage renal disease: Secondary | ICD-10-CM | POA: Diagnosis not present

## 2020-09-21 DIAGNOSIS — N2581 Secondary hyperparathyroidism of renal origin: Secondary | ICD-10-CM | POA: Diagnosis not present

## 2020-09-21 DIAGNOSIS — D631 Anemia in chronic kidney disease: Secondary | ICD-10-CM | POA: Diagnosis not present

## 2020-09-21 NOTE — Progress Notes (Signed)
Right shoulder, right ribs and right hip were negative for fracture.

## 2020-09-22 DIAGNOSIS — N186 End stage renal disease: Secondary | ICD-10-CM | POA: Diagnosis not present

## 2020-09-22 DIAGNOSIS — Z992 Dependence on renal dialysis: Secondary | ICD-10-CM | POA: Diagnosis not present

## 2020-09-22 DIAGNOSIS — T8612 Kidney transplant failure: Secondary | ICD-10-CM | POA: Diagnosis not present

## 2020-09-23 ENCOUNTER — Other Ambulatory Visit: Payer: Self-pay | Admitting: Adult Health

## 2020-09-23 DIAGNOSIS — D631 Anemia in chronic kidney disease: Secondary | ICD-10-CM | POA: Diagnosis not present

## 2020-09-23 DIAGNOSIS — M5412 Radiculopathy, cervical region: Secondary | ICD-10-CM

## 2020-09-23 DIAGNOSIS — F339 Major depressive disorder, recurrent, unspecified: Secondary | ICD-10-CM

## 2020-09-23 DIAGNOSIS — N186 End stage renal disease: Secondary | ICD-10-CM | POA: Diagnosis not present

## 2020-09-23 DIAGNOSIS — Z992 Dependence on renal dialysis: Secondary | ICD-10-CM | POA: Diagnosis not present

## 2020-09-23 DIAGNOSIS — N2581 Secondary hyperparathyroidism of renal origin: Secondary | ICD-10-CM | POA: Diagnosis not present

## 2020-09-23 NOTE — Addendum Note (Signed)
Addended by: Durenda Age C on: 09/23/2020 10:15 AM   Modules accepted: Orders

## 2020-09-23 NOTE — Addendum Note (Signed)
Addended by: Durenda Age C on: 09/23/2020 01:50 PM   Modules accepted: Orders

## 2020-09-23 NOTE — Telephone Encounter (Signed)
Pharmacy requested 90 day supply Pended Rx and sent to [facility] for approval due to [place]. ([place] out of office. )

## 2020-09-26 DIAGNOSIS — N186 End stage renal disease: Secondary | ICD-10-CM | POA: Diagnosis not present

## 2020-09-26 DIAGNOSIS — Z992 Dependence on renal dialysis: Secondary | ICD-10-CM | POA: Diagnosis not present

## 2020-09-26 DIAGNOSIS — D631 Anemia in chronic kidney disease: Secondary | ICD-10-CM | POA: Diagnosis not present

## 2020-09-26 DIAGNOSIS — N2581 Secondary hyperparathyroidism of renal origin: Secondary | ICD-10-CM | POA: Diagnosis not present

## 2020-09-28 DIAGNOSIS — N2581 Secondary hyperparathyroidism of renal origin: Secondary | ICD-10-CM | POA: Diagnosis not present

## 2020-09-28 DIAGNOSIS — H90A31 Mixed conductive and sensorineural hearing loss, unilateral, right ear with restricted hearing on the contralateral side: Secondary | ICD-10-CM | POA: Diagnosis not present

## 2020-09-28 DIAGNOSIS — Q8781 Alport syndrome: Secondary | ICD-10-CM | POA: Diagnosis not present

## 2020-09-28 DIAGNOSIS — D631 Anemia in chronic kidney disease: Secondary | ICD-10-CM | POA: Diagnosis not present

## 2020-09-28 DIAGNOSIS — Z992 Dependence on renal dialysis: Secondary | ICD-10-CM | POA: Diagnosis not present

## 2020-09-28 DIAGNOSIS — H9313 Tinnitus, bilateral: Secondary | ICD-10-CM | POA: Diagnosis not present

## 2020-09-28 DIAGNOSIS — N186 End stage renal disease: Secondary | ICD-10-CM | POA: Diagnosis not present

## 2020-09-28 DIAGNOSIS — H9221 Otorrhagia, right ear: Secondary | ICD-10-CM | POA: Diagnosis not present

## 2020-09-30 DIAGNOSIS — D631 Anemia in chronic kidney disease: Secondary | ICD-10-CM | POA: Diagnosis not present

## 2020-09-30 DIAGNOSIS — N186 End stage renal disease: Secondary | ICD-10-CM | POA: Diagnosis not present

## 2020-09-30 DIAGNOSIS — N2581 Secondary hyperparathyroidism of renal origin: Secondary | ICD-10-CM | POA: Diagnosis not present

## 2020-09-30 DIAGNOSIS — Z992 Dependence on renal dialysis: Secondary | ICD-10-CM | POA: Diagnosis not present

## 2020-10-03 DIAGNOSIS — Z992 Dependence on renal dialysis: Secondary | ICD-10-CM | POA: Diagnosis not present

## 2020-10-03 DIAGNOSIS — N186 End stage renal disease: Secondary | ICD-10-CM | POA: Diagnosis not present

## 2020-10-03 DIAGNOSIS — N2581 Secondary hyperparathyroidism of renal origin: Secondary | ICD-10-CM | POA: Diagnosis not present

## 2020-10-03 DIAGNOSIS — D631 Anemia in chronic kidney disease: Secondary | ICD-10-CM | POA: Diagnosis not present

## 2020-10-04 ENCOUNTER — Encounter (INDEPENDENT_AMBULATORY_CARE_PROVIDER_SITE_OTHER): Payer: Self-pay

## 2020-10-05 DIAGNOSIS — D631 Anemia in chronic kidney disease: Secondary | ICD-10-CM | POA: Diagnosis not present

## 2020-10-05 DIAGNOSIS — N186 End stage renal disease: Secondary | ICD-10-CM | POA: Diagnosis not present

## 2020-10-05 DIAGNOSIS — Z992 Dependence on renal dialysis: Secondary | ICD-10-CM | POA: Diagnosis not present

## 2020-10-05 DIAGNOSIS — N2581 Secondary hyperparathyroidism of renal origin: Secondary | ICD-10-CM | POA: Diagnosis not present

## 2020-10-07 DIAGNOSIS — N186 End stage renal disease: Secondary | ICD-10-CM | POA: Diagnosis not present

## 2020-10-07 DIAGNOSIS — N2581 Secondary hyperparathyroidism of renal origin: Secondary | ICD-10-CM | POA: Diagnosis not present

## 2020-10-07 DIAGNOSIS — Z992 Dependence on renal dialysis: Secondary | ICD-10-CM | POA: Diagnosis not present

## 2020-10-07 DIAGNOSIS — D631 Anemia in chronic kidney disease: Secondary | ICD-10-CM | POA: Diagnosis not present

## 2020-10-10 DIAGNOSIS — N2581 Secondary hyperparathyroidism of renal origin: Secondary | ICD-10-CM | POA: Diagnosis not present

## 2020-10-10 DIAGNOSIS — Z992 Dependence on renal dialysis: Secondary | ICD-10-CM | POA: Diagnosis not present

## 2020-10-10 DIAGNOSIS — D631 Anemia in chronic kidney disease: Secondary | ICD-10-CM | POA: Diagnosis not present

## 2020-10-10 DIAGNOSIS — N186 End stage renal disease: Secondary | ICD-10-CM | POA: Diagnosis not present

## 2020-10-11 DIAGNOSIS — H9211 Otorrhea, right ear: Secondary | ICD-10-CM | POA: Diagnosis not present

## 2020-10-11 DIAGNOSIS — H9071 Mixed conductive and sensorineural hearing loss, unilateral, right ear, with unrestricted hearing on the contralateral side: Secondary | ICD-10-CM | POA: Diagnosis not present

## 2020-10-11 DIAGNOSIS — H90A31 Mixed conductive and sensorineural hearing loss, unilateral, right ear with restricted hearing on the contralateral side: Secondary | ICD-10-CM | POA: Diagnosis not present

## 2020-10-11 DIAGNOSIS — H9221 Otorrhagia, right ear: Secondary | ICD-10-CM | POA: Diagnosis not present

## 2020-10-12 DIAGNOSIS — N2581 Secondary hyperparathyroidism of renal origin: Secondary | ICD-10-CM | POA: Diagnosis not present

## 2020-10-12 DIAGNOSIS — Z992 Dependence on renal dialysis: Secondary | ICD-10-CM | POA: Diagnosis not present

## 2020-10-12 DIAGNOSIS — N186 End stage renal disease: Secondary | ICD-10-CM | POA: Diagnosis not present

## 2020-10-12 DIAGNOSIS — D631 Anemia in chronic kidney disease: Secondary | ICD-10-CM | POA: Diagnosis not present

## 2020-10-14 DIAGNOSIS — N186 End stage renal disease: Secondary | ICD-10-CM | POA: Diagnosis not present

## 2020-10-14 DIAGNOSIS — D631 Anemia in chronic kidney disease: Secondary | ICD-10-CM | POA: Diagnosis not present

## 2020-10-14 DIAGNOSIS — Z992 Dependence on renal dialysis: Secondary | ICD-10-CM | POA: Diagnosis not present

## 2020-10-14 DIAGNOSIS — N2581 Secondary hyperparathyroidism of renal origin: Secondary | ICD-10-CM | POA: Diagnosis not present

## 2020-10-17 DIAGNOSIS — N2581 Secondary hyperparathyroidism of renal origin: Secondary | ICD-10-CM | POA: Diagnosis not present

## 2020-10-17 DIAGNOSIS — Z992 Dependence on renal dialysis: Secondary | ICD-10-CM | POA: Diagnosis not present

## 2020-10-17 DIAGNOSIS — D631 Anemia in chronic kidney disease: Secondary | ICD-10-CM | POA: Diagnosis not present

## 2020-10-17 DIAGNOSIS — N186 End stage renal disease: Secondary | ICD-10-CM | POA: Diagnosis not present

## 2020-10-19 DIAGNOSIS — Z992 Dependence on renal dialysis: Secondary | ICD-10-CM | POA: Diagnosis not present

## 2020-10-19 DIAGNOSIS — N2581 Secondary hyperparathyroidism of renal origin: Secondary | ICD-10-CM | POA: Diagnosis not present

## 2020-10-19 DIAGNOSIS — N186 End stage renal disease: Secondary | ICD-10-CM | POA: Diagnosis not present

## 2020-10-19 DIAGNOSIS — D631 Anemia in chronic kidney disease: Secondary | ICD-10-CM | POA: Diagnosis not present

## 2020-10-21 DIAGNOSIS — N2581 Secondary hyperparathyroidism of renal origin: Secondary | ICD-10-CM | POA: Diagnosis not present

## 2020-10-21 DIAGNOSIS — Z992 Dependence on renal dialysis: Secondary | ICD-10-CM | POA: Diagnosis not present

## 2020-10-21 DIAGNOSIS — N186 End stage renal disease: Secondary | ICD-10-CM | POA: Diagnosis not present

## 2020-10-21 DIAGNOSIS — D631 Anemia in chronic kidney disease: Secondary | ICD-10-CM | POA: Diagnosis not present

## 2020-10-22 DIAGNOSIS — T8612 Kidney transplant failure: Secondary | ICD-10-CM | POA: Diagnosis not present

## 2020-10-22 DIAGNOSIS — N186 End stage renal disease: Secondary | ICD-10-CM | POA: Diagnosis not present

## 2020-10-22 DIAGNOSIS — Z992 Dependence on renal dialysis: Secondary | ICD-10-CM | POA: Diagnosis not present

## 2020-10-24 DIAGNOSIS — Z23 Encounter for immunization: Secondary | ICD-10-CM | POA: Diagnosis not present

## 2020-10-24 DIAGNOSIS — D631 Anemia in chronic kidney disease: Secondary | ICD-10-CM | POA: Diagnosis not present

## 2020-10-24 DIAGNOSIS — Z992 Dependence on renal dialysis: Secondary | ICD-10-CM | POA: Diagnosis not present

## 2020-10-24 DIAGNOSIS — N2581 Secondary hyperparathyroidism of renal origin: Secondary | ICD-10-CM | POA: Diagnosis not present

## 2020-10-24 DIAGNOSIS — N186 End stage renal disease: Secondary | ICD-10-CM | POA: Diagnosis not present

## 2020-10-26 DIAGNOSIS — D631 Anemia in chronic kidney disease: Secondary | ICD-10-CM | POA: Diagnosis not present

## 2020-10-26 DIAGNOSIS — Z992 Dependence on renal dialysis: Secondary | ICD-10-CM | POA: Diagnosis not present

## 2020-10-26 DIAGNOSIS — Z23 Encounter for immunization: Secondary | ICD-10-CM | POA: Diagnosis not present

## 2020-10-26 DIAGNOSIS — N186 End stage renal disease: Secondary | ICD-10-CM | POA: Diagnosis not present

## 2020-10-26 DIAGNOSIS — N2581 Secondary hyperparathyroidism of renal origin: Secondary | ICD-10-CM | POA: Diagnosis not present

## 2020-10-27 ENCOUNTER — Ambulatory Visit: Payer: Medicare Other | Admitting: Family

## 2020-10-28 DIAGNOSIS — D631 Anemia in chronic kidney disease: Secondary | ICD-10-CM | POA: Diagnosis not present

## 2020-10-28 DIAGNOSIS — N2581 Secondary hyperparathyroidism of renal origin: Secondary | ICD-10-CM | POA: Diagnosis not present

## 2020-10-28 DIAGNOSIS — Z992 Dependence on renal dialysis: Secondary | ICD-10-CM | POA: Diagnosis not present

## 2020-10-28 DIAGNOSIS — Z23 Encounter for immunization: Secondary | ICD-10-CM | POA: Diagnosis not present

## 2020-10-28 DIAGNOSIS — N186 End stage renal disease: Secondary | ICD-10-CM | POA: Diagnosis not present

## 2020-10-31 DIAGNOSIS — N186 End stage renal disease: Secondary | ICD-10-CM | POA: Diagnosis not present

## 2020-10-31 DIAGNOSIS — Z23 Encounter for immunization: Secondary | ICD-10-CM | POA: Diagnosis not present

## 2020-10-31 DIAGNOSIS — N2581 Secondary hyperparathyroidism of renal origin: Secondary | ICD-10-CM | POA: Diagnosis not present

## 2020-10-31 DIAGNOSIS — D631 Anemia in chronic kidney disease: Secondary | ICD-10-CM | POA: Diagnosis not present

## 2020-10-31 DIAGNOSIS — Z992 Dependence on renal dialysis: Secondary | ICD-10-CM | POA: Diagnosis not present

## 2020-11-01 DIAGNOSIS — M4802 Spinal stenosis, cervical region: Secondary | ICD-10-CM | POA: Diagnosis not present

## 2020-11-01 DIAGNOSIS — Z981 Arthrodesis status: Secondary | ICD-10-CM | POA: Diagnosis not present

## 2020-11-01 DIAGNOSIS — M47812 Spondylosis without myelopathy or radiculopathy, cervical region: Secondary | ICD-10-CM | POA: Diagnosis not present

## 2020-11-01 DIAGNOSIS — M4312 Spondylolisthesis, cervical region: Secondary | ICD-10-CM | POA: Diagnosis not present

## 2020-11-02 DIAGNOSIS — N2581 Secondary hyperparathyroidism of renal origin: Secondary | ICD-10-CM | POA: Diagnosis not present

## 2020-11-02 DIAGNOSIS — Z23 Encounter for immunization: Secondary | ICD-10-CM | POA: Diagnosis not present

## 2020-11-02 DIAGNOSIS — D631 Anemia in chronic kidney disease: Secondary | ICD-10-CM | POA: Diagnosis not present

## 2020-11-02 DIAGNOSIS — Z992 Dependence on renal dialysis: Secondary | ICD-10-CM | POA: Diagnosis not present

## 2020-11-02 DIAGNOSIS — N186 End stage renal disease: Secondary | ICD-10-CM | POA: Diagnosis not present

## 2020-11-03 ENCOUNTER — Encounter: Payer: Self-pay | Admitting: Family

## 2020-11-03 ENCOUNTER — Ambulatory Visit (INDEPENDENT_AMBULATORY_CARE_PROVIDER_SITE_OTHER): Payer: Medicare Other | Admitting: Family

## 2020-11-03 ENCOUNTER — Other Ambulatory Visit: Payer: Self-pay

## 2020-11-03 VITALS — BP 128/70 | HR 66 | Temp 97.1°F | Resp 18 | Ht 67.0 in | Wt 219.8 lb

## 2020-11-03 DIAGNOSIS — M545 Low back pain, unspecified: Secondary | ICD-10-CM

## 2020-11-03 DIAGNOSIS — N186 End stage renal disease: Secondary | ICD-10-CM

## 2020-11-03 DIAGNOSIS — G8929 Other chronic pain: Secondary | ICD-10-CM

## 2020-11-03 DIAGNOSIS — F339 Major depressive disorder, recurrent, unspecified: Secondary | ICD-10-CM

## 2020-11-03 DIAGNOSIS — Z992 Dependence on renal dialysis: Secondary | ICD-10-CM | POA: Diagnosis not present

## 2020-11-03 DIAGNOSIS — N2581 Secondary hyperparathyroidism of renal origin: Secondary | ICD-10-CM | POA: Diagnosis not present

## 2020-11-03 DIAGNOSIS — E782 Mixed hyperlipidemia: Secondary | ICD-10-CM

## 2020-11-03 DIAGNOSIS — F5101 Primary insomnia: Secondary | ICD-10-CM

## 2020-11-03 DIAGNOSIS — E1121 Type 2 diabetes mellitus with diabetic nephropathy: Secondary | ICD-10-CM

## 2020-11-03 DIAGNOSIS — I120 Hypertensive chronic kidney disease with stage 5 chronic kidney disease or end stage renal disease: Secondary | ICD-10-CM

## 2020-11-03 DIAGNOSIS — E8770 Fluid overload, unspecified: Secondary | ICD-10-CM | POA: Diagnosis not present

## 2020-11-03 DIAGNOSIS — K219 Gastro-esophageal reflux disease without esophagitis: Secondary | ICD-10-CM

## 2020-11-03 MED ORDER — TRAZODONE HCL 50 MG PO TABS: 25.0000 mg | ORAL_TABLET | Freq: Every evening | ORAL | 3 refills | Status: DC | PRN

## 2020-11-03 NOTE — Patient Instructions (Signed)
Insomnia Insomnia is a sleep disorder that makes it difficult to fall asleep or stay asleep. Insomnia can cause fatigue, low energy, difficulty concentrating, moodswings, and poor performance at work or school. There are three different ways to classify insomnia: Difficulty falling asleep. Difficulty staying asleep. Waking up too early in the morning. Any type of insomnia can be long-term (chronic) or short-term (acute). Both are common. Short-term insomnia usually lasts for three months or less. Chronic insomnia occurs at least three times a week for longer than threemonths. What are the causes? Insomnia may be caused by another condition, situation, or substance, such as: Anxiety. Certain medicines. Gastroesophageal reflux disease (GERD) or other gastrointestinal conditions. Asthma or other breathing conditions. Restless legs syndrome, sleep apnea, or other sleep disorders. Chronic pain. Menopause. Stroke. Abuse of alcohol, tobacco, or illegal drugs. Mental health conditions, such as depression. Caffeine. Neurological disorders, such as Alzheimer's disease. An overactive thyroid (hyperthyroidism). Sometimes, the cause of insomnia may not be known. What increases the risk? Risk factors for insomnia include: Gender. Women are affected more often than men. Age. Insomnia is more common as you get older. Stress. Lack of exercise. Irregular work schedule or working night shifts. Traveling between different time zones. Certain medical and mental health conditions. What are the signs or symptoms? If you have insomnia, the main symptom is having trouble falling asleep or having trouble staying asleep. This may lead to other symptoms, such as: Feeling fatigued or having low energy. Feeling nervous about going to sleep. Not feeling rested in the morning. Having trouble concentrating. Feeling irritable, anxious, or depressed. How is this diagnosed? This condition may be diagnosed based  on: Your symptoms and medical history. Your health care provider may ask about: Your sleep habits. Any medical conditions you have. Your mental health. A physical exam. How is this treated? Treatment for insomnia depends on the cause. Treatment may focus on treating an underlying condition that is causing insomnia. Treatment may also include: Medicines to help you sleep. Counseling or therapy. Lifestyle adjustments to help you sleep better. Follow these instructions at home: Eating and drinking  Limit or avoid alcohol, caffeinated beverages, and cigarettes, especially close to bedtime. These can disrupt your sleep. Do not eat a large meal or eat spicy foods right before bedtime. This can lead to digestive discomfort that can make it hard for you to sleep.  Sleep habits  Keep a sleep diary to help you and your health care provider figure out what could be causing your insomnia. Write down: When you sleep. When you wake up during the night. How well you sleep. How rested you feel the next day. Any side effects of medicines you are taking. What you eat and drink. Make your bedroom a dark, comfortable place where it is easy to fall asleep. Put up shades or blackout curtains to block light from outside. Use a white noise machine to block noise. Keep the temperature cool. Limit screen use before bedtime. This includes: Watching TV. Using your smartphone, tablet, or computer. Stick to a routine that includes going to bed and waking up at the same times every day and night. This can help you fall asleep faster. Consider making a quiet activity, such as reading, part of your nighttime routine. Try to avoid taking naps during the day so that you sleep better at night. Get out of bed if you are still awake after 15 minutes of trying to sleep. Keep the lights down, but try reading or doing a quiet   activity. When you feel sleepy, go back to bed.  General instructions Take over-the-counter  and prescription medicines only as told by your health care provider. Exercise regularly, as told by your health care provider. Avoid exercise starting several hours before bedtime. Use relaxation techniques to manage stress. Ask your health care provider to suggest some techniques that may work well for you. These may include: Breathing exercises. Routines to release muscle tension. Visualizing peaceful scenes. Make sure that you drive carefully. Avoid driving if you feel very sleepy. Keep all follow-up visits as told by your health care provider. This is important. Contact a health care provider if: You are tired throughout the day. You have trouble in your daily routine due to sleepiness. You continue to have sleep problems, or your sleep problems get worse. Get help right away if: You have serious thoughts about hurting yourself or someone else. If you ever feel like you may hurt yourself or others, or have thoughts about taking your own life, get help right away. You can go to your nearest emergency department or call: Your local emergency services (911 in the [place] A suicide crisis helpline, such as the National Suicide Prevention Lifeline at 1-800-273-8255. This is open 24 hours a day. Summary Insomnia is a sleep disorder that makes it difficult to fall asleep or stay asleep. Insomnia can be long-term (chronic) or short-term (acute). Treatment for insomnia depends on the cause. Treatment may focus on treating an underlying condition that is causing insomnia. Keep a sleep diary to help you and your health care provider figure out what could be causing your insomnia. This information is not intended to replace advice given to you by your health care provider. Make sure you discuss any questions you have with your healthcare provider. Document Revised: 11/19/2019 Document Reviewed: 11/19/2019 Elsevier Patient Education  2022 Elsevier Inc.  

## 2020-11-03 NOTE — Progress Notes (Signed)
Provider: Marlowe Sax FNP-C   Basil Blakesley, Nelda Bucks, NP  Patient Care Team: Dreama Kuna, Nelda Bucks, NP as PCP - General (Family Medicine) [name], MD as PCP - Cardiology (Cardiology) Jovita Kussmaul, MD as Consulting Physician (General Surgery) [name], MD as Consulting Physician (Pulmonary Disease) Estanislado Emms, MD (Inactive) as Consulting Physician (Nephrology) Elam Dutch, MD as Consulting Physician (Vascular Surgery) [name], MD as Consulting Physician (Cardiology) Milus Banister, MD as Attending Physician (Gastroenterology) Jamal Maes, MD as Consulting Physician (Nephrology)  Extended Emergency Contact Information Primary Emergency Contact: [facility] Address: [address]          [place], [place] [address] Johnnette Litter of [place] Phone: [phone] Mobile Phone: [phone] Relation: Spouse  Code Status:  Full Code  Goals of care: Advanced Directive information Advanced Directives 11/03/2020  Does Patient Have a Medical Advance Directive? No  Does patient want to make changes to medical advance directive? -  Would patient like information on creating a medical advance directive? No - Patient declined  Pre-existing out of facility DNR order (yellow form or pink MOST form) -     Chief Complaint  Patient presents with   Medical Management of Chronic Issues    6 month follow up   Health Maintenance    Discuss the need for Hemoglobin A1C.   Immunizations    Discuss the need for Shingrix vaccine, Tetanus vaccine, and 2nd Covid Booster.    HPI:  Pt is a 60 y.o. male seen today for 6 months follow up for medical management of chronic diseases.He is here with wife [name].States continues to have neck pain which is keeping him up at night turning and tossing.Not getting enough sleep. He was recently 11/01/2020 seen at [facility] Neurosurgery of spine and Pain by Dr. Gerilyn Pilgrim.Has chronic worsening neck pain,lower back pain with numbness  in all extremities.Has tried OTC medication,Narcotics and muscle relaxants but none helps with the pain.Plan for future surgery cervical discectomy either anterior posterior>he is considered high risk for transient bacteremia due to being currently on dialysis.Non-surgical management recommended first.He was referred to Physical therapy for neck and back strengthening range of motion and cervical traction and Aqua therapy if available.He was also advised to follow up with pain management specialist.He was advised to follow up in 3-6 months or if symptoms are persistent and he will need medical clearance from Nephrology then ACDF can be considered.Signs and symptoms of progression of myelopathy was advised and to give a call to spine specialist.   Patient's wife brought in script written by Dr.Grossi inquiring if PCP is aware of any Aqua therapy around.New Physical Therapy/Aqua facility at [facility] was provided by referral coordinator today.    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)    Anxiety    Asthma    Asthma    moderate persistent   Carpal tunnel syndrome    Cellulitis and abscess of trunk    Cholelithiasis 07/13/2014   Chronic headaches    Debility, unspecified    Dermatophytosis of the body    Dysrhythmia    history of   Edema    End stage renal disease on dialysis [facility])    "MWF; [facility]" (10/21/2014)   Essential hypertension, benign    GERD (gastroesophageal reflux  disease)    Gout, unspecified    HTN (hypertension)    Hypertrophy of prostate without urinary obstruction and other lower urinary tract symptoms (LUTS)    Hypotension, unspecified    Impotence of organic origin    Insomnia, unspecified    Kidney replaced by transplant    Localization-related (focal) (partial) epilepsy and epileptic  syndromes with complex partial seizures, without mention of intractable epilepsy    12-15-19- Wife states he has NEVER had a seizure    Lumbago    Memory loss    OSA on CPAP    Other and unspecified hyperlipidemia    controlled /managed per wife    Other chronic nonalcoholic liver disease    Other malaise and fatigue    Other nonspecific abnormal serum enzyme levels    Pain in joint, lower leg    Pain in joint, upper arm    Pneumonia "several times"   Renal dialysis status(V45.11) 02/05/2010   restarted 01/02/13 ofter renal trransplant failure   Secondary hyperparathyroidism (of renal origin)    Shortness of breath    Sleep apnea    wears cpap    Tension headache    Unspecified constipation    Unspecified essential hypertension    Unspecified hereditary and idiopathic peripheral neuropathy    Unspecified vitamin D deficiency    Past Surgical History:  Procedure Laterality Date   AV FISTULA PLACEMENT Left ?2010   "forearm; at [place]"   [place]  03/21/2011   CHOLECYSTECTOMY N/A 10/21/2014   Procedure: LAPAROSCOPIC CHOLECYSTECTOMY WITH INTRAOPERATIVE CHOLANGIOGRAM;  Surgeon: Autumn Messing III, MD;  Location: [facility];  Service: General;  Laterality: N/A;   COLONOSCOPY     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   KIDNEY TRANSPLANT  08/17/2011   [place]  10/21/2014   w/IOC   LEFT HEART CATHETERIZATION WITH CORONARY ANGIOGRAM N/A 03/21/2011   Procedure: LEFT HEART CATHETERIZATION WITH CORONARY ANGIOGRAM;  Surgeon: [name], MD;  Location: [facility] CATH LAB;  Service: Cardiovascular;  Laterality: N/A;   NEPHRECTOMY  08/2013   removed transplaned kidney   POLYPECTOMY     POSTERIOR FUSION CERVICAL SPINE  06/25/2012   for spinal stenosis   VASECTOMY  2010    Allergies  Allergen Reactions   Codeine Nausea And Vomiting   Hydrocodone-Acetaminophen Nausea And Vomiting    Allergies as of  11/03/2020       Reactions   Codeine Nausea And Vomiting   Hydrocodone-acetaminophen Nausea And Vomiting        Medication List        Accurate as of November 03, 2020  3:05 PM. If you have any questions, ask your nurse or doctor.          acetaminophen 500 MG tablet Commonly known as: TYLENOL Take 1 tablet (500 mg total) by mouth every 6 (six) hours as needed for moderate pain.   albuterol 108 (90 Base) MCG/ACT inhaler Commonly known as: VENTOLIN HFA Inhale 2 puffs into the lungs every 6 (six) hours as needed for wheezing or shortness of breath.   amLODipine 10 MG tablet Commonly known as: NORVASC TAKE 1 TABLET BY MOUTH EVERY DAY   aspirin EC 81 MG tablet Take 81 mg by mouth daily.   b complex-vitamin c-folic acid 0.8 MG Tabs tablet TAKE 1 TABLET BY MOUTH EVERY DAY   budesonide-formoterol 160-4.5 MCG/ACT inhaler Commonly known as: Symbicort  Inhale 2 puffs into the lungs 2 (two) times daily.   calcitRIOL 0.5 MCG capsule Commonly known as: ROCALTROL Take 2 capsules (1 mcg total) by mouth every Monday, Wednesday, and Friday with hemodialysis.   chlorhexidine 0.12 % solution Commonly known as: PERIDEX SMARTSIG:By Mouth   cinacalcet 90 MG tablet Commonly known as: SENSIPAR Take 90 mg by mouth every evening.   cloNIDine 0.1 MG tablet Commonly known as: CATAPRES Take 0.1 mg by mouth at bedtime.   cyclobenzaprine 5 MG tablet Commonly known as: FLEXERIL TAKE 1 TABLET BY MOUTH AT BEDTIME AS NEEDED FOR MUSCLE SPASMS.   diphenhydrAMINE 25 MG tablet Commonly known as: BENADRYL Take 50 mg by mouth at bedtime.   doxazosin 4 MG tablet Commonly known as: CARDURA Take 4 mg by mouth daily.   DULoxetine 30 MG capsule Commonly known as: CYMBALTA TAKE 1 CAPSULE BY MOUTH EVERY DAY   famotidine 20 MG tablet Commonly known as: Pepcid Take 1 tablet (20 mg total) by mouth at bedtime.   gabapentin 100 MG capsule Commonly known as: Neurontin Take 1 capsule (100 mg  total) by mouth 2 (two) times daily.   guaiFENesin 600 MG 12 hr tablet Commonly known as: MUCINEX Take 1,200 mg by mouth 2 (two) times daily.   hydrALAZINE 100 MG tablet Commonly known as: APRESOLINE TAKE 1 TABLET EVERY MORNINGAND AT BEDTIME.   ipratropium-albuterol 0.5-2.5 (3) MG/3ML Soln Commonly known as: DUONEB Take 3 mLs by nebulization every 4 (four) hours as needed (Shortness of breath).   isosorbide mononitrate 30 MG 24 hr tablet Commonly known as: IMDUR TAKE 1 TABLET DAILY   lidocaine-prilocaine cream Commonly known as: EMLA Apply 1 application topically See admin instructions. Apply 1 to 2 hours prior to dialysis on Mondays, Wednesdays, and Fridays. Cover with saran wrap.   metoprolol tartrate 100 MG tablet Commonly known as: LOPRESSOR TAKE 1 TABLET BY MOUTH TWICE A DAY   omeprazole 20 MG capsule Commonly known as: PRILOSEC TAKE 1 CAPSULE TWICE DAILY BEFORE A MEAL   ondansetron 4 MG disintegrating tablet Commonly known as: Zofran ODT Take 1 tablet (4 mg total) by mouth every 8 (eight) hours as needed for nausea or vomiting.   pravastatin 40 MG tablet Commonly known as: PRAVACHOL TAKE 1 TABLET BY MOUTH EVERY DAY IN THE EVENING   PRESCRIPTION MEDICATION See admin instructions. CPAP- At bedtime and during any time of rest   Renvela 800 MG tablet Generic drug: sevelamer carbonate Take 800-2,400 mg by mouth See admin instructions. Take 2,400 mg by mouth three times a day with meals and 800 mg with each snack   traMADol 50 MG tablet Commonly known as: ULTRAM Take 1 tablet (50 mg total) by mouth daily as needed.   Vitamin D3 25 MCG (1000 UT) Caps Take 4,000 Units by mouth daily.        Review of Systems  Constitutional:  Negative for appetite change, chills, fatigue, fever and unexpected weight change.  HENT:  Positive for hearing loss. Negative for congestion, dental problem, ear discharge, ear pain, facial swelling, nosebleeds, postnasal drip,  rhinorrhea, sinus pressure, sinus pain, sneezing, sore throat, tinnitus and trouble swallowing.   Eyes:  Positive for visual disturbance. Negative for pain, discharge, redness and itching.  Respiratory:  Negative for cough, chest tightness, shortness of breath and wheezing.   Cardiovascular:  Negative for chest pain, palpitations and leg swelling.  Gastrointestinal:  Negative for abdominal distention, abdominal pain, blood in stool, constipation, diarrhea, nausea and vomiting.  Endocrine: Negative  for cold intolerance, heat intolerance, polydipsia, polyphagia and polyuria.  Genitourinary:  Negative for difficulty urinating, dysuria, flank pain, frequency and urgency.  Musculoskeletal:  Positive for arthralgias and back pain. Negative for gait problem, joint swelling, myalgias, neck pain and neck stiffness.  Skin:  Negative for color change, pallor, rash and wound.  Neurological:  Negative for dizziness, syncope, speech difficulty, weakness, light-headedness, numbness and headaches.  Hematological:  Does not bruise/bleed easily.  Psychiatric/Behavioral:  Positive for sleep disturbance. Negative for agitation, behavioral problems, confusion, hallucinations, self-injury and suicidal ideas. The patient is not nervous/anxious.    Immunization History  Administered Date(s) Administered   Influenza Whole 11/01/2010   Influenza, Seasonal, Injecte, Preservative Fre 10/29/2012   Influenza-Unspecified 11/21/2011, 11/05/2012, 09/22/2013, 09/22/2017, 11/23/2019, 10/28/2020   Moderna Sars-Covid-2 Vaccination 04/06/2019, 05/07/2019, 03/31/2020   PPD Test 12/26/2012   Pneumococcal Polysaccharide-23 11/01/2010, 10/29/2012   Pneumococcal-Unspecified 11/05/2012   Tdap 01/22/2006   Pertinent  Health Maintenance Due  Topic Date Due   HEMOGLOBIN A1C  04/20/2020   COLONOSCOPY (Pts 45-72yrs Insurance coverage will need to be confirmed)  12/29/2026   INFLUENZA VACCINE  Completed   FOOT EXAM  Discontinued    OPHTHALMOLOGY EXAM  Discontinued   Fall Risk  11/03/2020 09/20/2020 09/06/2020 08/24/2020 06/24/2020  Falls in the past year? 1 1 0 0 1  Number falls in past yr: 0 0 0 0 0  Injury with Fall? 0 0 0 0 0  Comment - - - - -  Risk for fall due to : History of fall(s) - No Fall Risks No Fall Risks -  Follow up Falls evaluation completed;Education provided;Falls prevention discussed Falls evaluation completed Falls evaluation completed Falls evaluation completed -   Functional Status Survey:    Vitals:   11/03/20 1500  BP: 128/70  Pulse: 66  Resp: 18  Temp: (!) 97.1 F (36.2 C)  SpO2: 97%  Weight: 219 lb 12.8 oz (99.7 kg)  Height: 5\' 7"  (1.702 m)   Body mass index is 34.43 kg/m. Physical Exam Vitals reviewed.  Constitutional:      General: He is not in acute distress.    Appearance: Normal appearance. He is obese. He is not ill-appearing or diaphoretic.  HENT:     Head: Normocephalic.     Right Ear: No drainage or tenderness. Tympanic membrane is perforated.     Left Ear: Tympanic membrane, ear canal and external ear normal. There is no impacted cerumen.     Nose: Nose normal. No congestion or rhinorrhea.     Mouth/Throat:     Mouth: Mucous membranes are moist.     Pharynx: Oropharynx is clear. No oropharyngeal exudate or posterior oropharyngeal erythema.  Eyes:     General: No scleral icterus.       Right eye: No discharge.        Left eye: No discharge.     Extraocular Movements: Extraocular movements intact.     Conjunctiva/sclera: Conjunctivae normal.     Pupils: Pupils are equal, round, and reactive to light.  Neck:     Vascular: No carotid bruit.  Cardiovascular:     Rate and Rhythm: Normal rate and regular rhythm.     Pulses: Normal pulses.     Heart sounds: Normal heart sounds. No murmur heard.   No friction rub. No gallop.  Pulmonary:     Effort: Pulmonary effort is normal. No respiratory distress.     Breath sounds: Normal breath sounds. No wheezing, rhonchi or  rales.  Chest:  Chest wall: No tenderness.  Abdominal:     General: Bowel sounds are normal. There is no distension.     Palpations: Abdomen is soft. There is no mass.     Tenderness: There is no abdominal tenderness. There is no right CVA tenderness, left CVA tenderness, guarding or rebound.  Musculoskeletal:        General: No swelling or tenderness. Normal range of motion.     Cervical back: Normal range of motion. No rigidity or tenderness.     Right lower leg: No edema.     Left lower leg: No edema.  Lymphadenopathy:     Cervical: No cervical adenopathy.  Skin:    General: Skin is warm and dry.     Coloration: Skin is not pale.     Findings: No bruising, erythema, lesion or rash.  Neurological:     Mental Status: He is alert and oriented to person, place, and time.     Cranial Nerves: No cranial nerve deficit.     Sensory: No sensory deficit.     Motor: No weakness.     Coordination: Coordination normal.     Gait: Gait normal.  Psychiatric:        Mood and Affect: Mood normal.        Speech: Speech normal.        Behavior: Behavior normal.        Thought Content: Thought content normal.        Judgment: Judgment normal.    Labs reviewed: Recent Labs    05/06/20 1516  NA 136  K 3.1*  CL 99  CO2 28  GLUCOSE 106*  BUN 18  CREATININE 6.51*  CALCIUM 9.6   Recent Labs    05/06/20 1516  AST 34  ALT 30  ALKPHOS 72  BILITOT 0.9  PROT 7.7  ALBUMIN 3.9   Recent Labs    03/03/20 0833 05/06/20 1516  WBC 4.8 4.5  NEUTROABS 2,928 2.7  HGB 11.8* 12.6*  HCT 35.7* 37.5*  MCV 100.6* 104.7*  PLT 84* 85*   Lab Results  Component Value Date   TSH 1.38 03/03/2020   Lab Results  Component Value Date   HGBA1C 5.0 10/22/2019   Lab Results  Component Value Date   CHOL 108 03/03/2020   HDL 29 (L) 03/03/2020   LDLCALC 52 03/03/2020   TRIG 196 (H) 03/03/2020   CHOLHDL 3.7 03/03/2020    Significant Diagnostic Results in last 30 days:  No results  found.  Assessment/Plan  1. Benign hypertension with ESRD (end-stage renal disease) ([place]) B/p well controlled  Continue on amlodipine,clonidine,hydralazine,Imdur and doxazosin  - CBC with Differential/Platelet  2. Mixed hyperlipidemia LDL at goal  - continue on Pravastatin  - dietary modification advised  - Lipid Panel  3. Major depression, recurrent, chronic (HCC) Stable. Continue on duloxetine   4. Chronic bilateral low back pain without sciatica Chronic  - continue to follow up with Dr.Grossi as advised  - follow up with Pain management  - continue Tramadol  - Aqua Therapy address given at [facility] [name]  5. Gastroesophageal reflux disease without esophagitis Symptoms controlled on Omeprazole  - CBC with Differential/Platelet  6. Type 2 diabetes mellitus with diabetic nephropathy, without long-term current use of insulin (HCC) Lab Results  Component Value Date   HGBA1C 5.0 10/22/2019  - diet controlled  - continue on Gabapentin  - Hemoglobin A1c  7. Primary insomnia Start on trazodone as below  - sleep  hygiene advised  - traZODone (DESYREL) 50 MG tablet; Take 0.5-1 tablets (25-50 mg total) by mouth at bedtime as needed for sleep.  Dispense: 30 tablet; Refill: 3 - Additional Education Information on Insomnia provided on AVS   Family/ staff Communication: Reviewed plan of care with patient and wife verbalized   Labs/tests ordered:  - CBC with Differential/Platelet - Hemoglobin A 1 C - Lipid Panel  Next Appointment : 6 months for medical management of chronic issues.Fasting Labs prior to visit.    Sandrea Hughs, NP

## 2020-11-04 DIAGNOSIS — Z23 Encounter for immunization: Secondary | ICD-10-CM | POA: Diagnosis not present

## 2020-11-04 DIAGNOSIS — Z992 Dependence on renal dialysis: Secondary | ICD-10-CM | POA: Diagnosis not present

## 2020-11-04 DIAGNOSIS — N2581 Secondary hyperparathyroidism of renal origin: Secondary | ICD-10-CM | POA: Diagnosis not present

## 2020-11-04 DIAGNOSIS — N186 End stage renal disease: Secondary | ICD-10-CM | POA: Diagnosis not present

## 2020-11-04 DIAGNOSIS — D631 Anemia in chronic kidney disease: Secondary | ICD-10-CM | POA: Diagnosis not present

## 2020-11-04 LAB — CBC WITH DIFFERENTIAL/PLATELET
Absolute Monocytes: 540 cells/uL (ref 200–950)
Basophils Absolute: 31 cells/uL (ref 0–200)
Basophils Relative: 1.3 %
Eosinophils Absolute: 101 cells/uL (ref 15–500)
Eosinophils Relative: 4.2 %
HCT: 30.6 % — ABNORMAL LOW (ref 38.5–50.0)
Hemoglobin: 10.2 g/dL — ABNORMAL LOW (ref 13.2–17.1)
Lymphs Abs: 720 cells/uL — ABNORMAL LOW (ref 850–3900)
MCH: 31.9 pg (ref 27.0–33.0)
MCHC: 33.3 g/dL (ref 32.0–36.0)
MCV: 95.6 fL (ref 80.0–100.0)
MPV: 11.1 fL (ref 7.5–12.5)
Monocytes Relative: 22.5 %
Neutro Abs: 1008 cells/uL — ABNORMAL LOW (ref 1500–7800)
Neutrophils Relative %: 42 %
Platelets: 105 10*3/uL — ABNORMAL LOW (ref 140–400)
RBC: 3.2 10*6/uL — ABNORMAL LOW (ref 4.20–5.80)
RDW: 13.8 % (ref 11.0–15.0)
Total Lymphocyte: 30 %
WBC: 2.4 10*3/uL — ABNORMAL LOW (ref 3.8–10.8)

## 2020-11-04 LAB — LIPID PANEL
Cholesterol: 166 mg/dL (ref <200)
HDL: 28 mg/dL — ABNORMAL LOW (ref >=40)
Non-HDL Cholesterol (Calc): 138 mg/dL (calc) — ABNORMAL HIGH (ref <130)
Total CHOL/HDL Ratio: 5.9 (calc) — ABNORMAL HIGH (ref <5.0)
Triglycerides: 429 mg/dL — ABNORMAL HIGH (ref <150)

## 2020-11-04 LAB — HEMOGLOBIN A1C
Hgb A1c MFr Bld: 5.8 % of total Hgb — ABNORMAL HIGH (ref <5.7)
Mean Plasma Glucose: 120 mg/dL
eAG (mmol/L): 6.6 mmol/L

## 2020-11-07 ENCOUNTER — Other Ambulatory Visit: Payer: Self-pay

## 2020-11-07 DIAGNOSIS — D631 Anemia in chronic kidney disease: Secondary | ICD-10-CM | POA: Diagnosis not present

## 2020-11-07 DIAGNOSIS — N186 End stage renal disease: Secondary | ICD-10-CM | POA: Diagnosis not present

## 2020-11-07 DIAGNOSIS — Z992 Dependence on renal dialysis: Secondary | ICD-10-CM | POA: Diagnosis not present

## 2020-11-07 DIAGNOSIS — N2581 Secondary hyperparathyroidism of renal origin: Secondary | ICD-10-CM | POA: Diagnosis not present

## 2020-11-07 DIAGNOSIS — Z23 Encounter for immunization: Secondary | ICD-10-CM | POA: Diagnosis not present

## 2020-11-07 MED ORDER — VITAMIN C 250 MG PO TABS: 250.0000 mg | ORAL_TABLET | Freq: Every day | ORAL | 3 refills | Status: DC

## 2020-11-09 DIAGNOSIS — N2581 Secondary hyperparathyroidism of renal origin: Secondary | ICD-10-CM | POA: Diagnosis not present

## 2020-11-09 DIAGNOSIS — N186 End stage renal disease: Secondary | ICD-10-CM | POA: Diagnosis not present

## 2020-11-09 DIAGNOSIS — Z992 Dependence on renal dialysis: Secondary | ICD-10-CM | POA: Diagnosis not present

## 2020-11-09 DIAGNOSIS — Z23 Encounter for immunization: Secondary | ICD-10-CM | POA: Diagnosis not present

## 2020-11-09 DIAGNOSIS — D631 Anemia in chronic kidney disease: Secondary | ICD-10-CM | POA: Diagnosis not present

## 2020-11-11 DIAGNOSIS — D631 Anemia in chronic kidney disease: Secondary | ICD-10-CM | POA: Diagnosis not present

## 2020-11-11 DIAGNOSIS — Z992 Dependence on renal dialysis: Secondary | ICD-10-CM | POA: Diagnosis not present

## 2020-11-11 DIAGNOSIS — Z23 Encounter for immunization: Secondary | ICD-10-CM | POA: Diagnosis not present

## 2020-11-11 DIAGNOSIS — N186 End stage renal disease: Secondary | ICD-10-CM | POA: Diagnosis not present

## 2020-11-11 DIAGNOSIS — N2581 Secondary hyperparathyroidism of renal origin: Secondary | ICD-10-CM | POA: Diagnosis not present

## 2020-11-14 DIAGNOSIS — N2581 Secondary hyperparathyroidism of renal origin: Secondary | ICD-10-CM | POA: Diagnosis not present

## 2020-11-14 DIAGNOSIS — Z23 Encounter for immunization: Secondary | ICD-10-CM | POA: Diagnosis not present

## 2020-11-14 DIAGNOSIS — Z992 Dependence on renal dialysis: Secondary | ICD-10-CM | POA: Diagnosis not present

## 2020-11-14 DIAGNOSIS — N186 End stage renal disease: Secondary | ICD-10-CM | POA: Diagnosis not present

## 2020-11-14 DIAGNOSIS — D631 Anemia in chronic kidney disease: Secondary | ICD-10-CM | POA: Diagnosis not present

## 2020-11-15 ENCOUNTER — Other Ambulatory Visit: Payer: Self-pay

## 2020-11-15 ENCOUNTER — Encounter (HOSPITAL_BASED_OUTPATIENT_CLINIC_OR_DEPARTMENT_OTHER): Payer: Self-pay | Admitting: Physical Therapy

## 2020-11-15 ENCOUNTER — Encounter (HOSPITAL_BASED_OUTPATIENT_CLINIC_OR_DEPARTMENT_OTHER): Payer: Medicare Other | Attending: Neurosurgery | Admitting: Physical Therapy

## 2020-11-15 DIAGNOSIS — M542 Cervicalgia: Secondary | ICD-10-CM | POA: Diagnosis not present

## 2020-11-15 DIAGNOSIS — M6281 Muscle weakness (generalized): Secondary | ICD-10-CM | POA: Insufficient documentation
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
## 2020-11-16 DIAGNOSIS — H7291 Unspecified perforation of tympanic membrane, right ear: Secondary | ICD-10-CM | POA: Diagnosis not present

## 2020-11-16 DIAGNOSIS — D631 Anemia in chronic kidney disease: Secondary | ICD-10-CM | POA: Diagnosis not present

## 2020-11-16 DIAGNOSIS — Z992 Dependence on renal dialysis: Secondary | ICD-10-CM | POA: Diagnosis not present

## 2020-11-16 DIAGNOSIS — Q8781 Alport syndrome: Secondary | ICD-10-CM | POA: Diagnosis not present

## 2020-11-16 DIAGNOSIS — H90A22 Sensorineural hearing loss, unilateral, left ear, with restricted hearing on the contralateral side: Secondary | ICD-10-CM | POA: Diagnosis not present

## 2020-11-16 DIAGNOSIS — H90A31 Mixed conductive and sensorineural hearing loss, unilateral, right ear with restricted hearing on the contralateral side: Secondary | ICD-10-CM | POA: Diagnosis not present

## 2020-11-16 DIAGNOSIS — H7011 Chronic mastoiditis, right ear: Secondary | ICD-10-CM | POA: Diagnosis not present

## 2020-11-16 DIAGNOSIS — Z23 Encounter for immunization: Secondary | ICD-10-CM | POA: Diagnosis not present

## 2020-11-16 DIAGNOSIS — N2581 Secondary hyperparathyroidism of renal origin: Secondary | ICD-10-CM | POA: Diagnosis not present

## 2020-11-16 DIAGNOSIS — N186 End stage renal disease: Secondary | ICD-10-CM | POA: Diagnosis not present

## 2020-11-18 DIAGNOSIS — N186 End stage renal disease: Secondary | ICD-10-CM | POA: Diagnosis not present

## 2020-11-18 DIAGNOSIS — Z992 Dependence on renal dialysis: Secondary | ICD-10-CM | POA: Diagnosis not present

## 2020-11-18 DIAGNOSIS — N2581 Secondary hyperparathyroidism of renal origin: Secondary | ICD-10-CM | POA: Diagnosis not present

## 2020-11-18 DIAGNOSIS — Z23 Encounter for immunization: Secondary | ICD-10-CM | POA: Diagnosis not present

## 2020-11-18 DIAGNOSIS — D631 Anemia in chronic kidney disease: Secondary | ICD-10-CM | POA: Diagnosis not present

## 2020-11-21 DIAGNOSIS — Z23 Encounter for immunization: Secondary | ICD-10-CM | POA: Diagnosis not present

## 2020-11-21 DIAGNOSIS — Z992 Dependence on renal dialysis: Secondary | ICD-10-CM | POA: Diagnosis not present

## 2020-11-21 DIAGNOSIS — D631 Anemia in chronic kidney disease: Secondary | ICD-10-CM | POA: Diagnosis not present

## 2020-11-21 DIAGNOSIS — N2581 Secondary hyperparathyroidism of renal origin: Secondary | ICD-10-CM | POA: Diagnosis not present

## 2020-11-21 DIAGNOSIS — N186 End stage renal disease: Secondary | ICD-10-CM | POA: Diagnosis not present

## 2020-11-22 ENCOUNTER — Other Ambulatory Visit: Payer: Self-pay

## 2020-11-22 ENCOUNTER — Ambulatory Visit (INDEPENDENT_AMBULATORY_CARE_PROVIDER_SITE_OTHER): Payer: Medicare Other | Admitting: Orthopaedic Surgery

## 2020-11-22 ENCOUNTER — Encounter: Payer: Self-pay | Admitting: Orthopaedic Surgery

## 2020-11-22 ENCOUNTER — Ambulatory Visit: Payer: Self-pay

## 2020-11-22 DIAGNOSIS — G8929 Other chronic pain: Secondary | ICD-10-CM | POA: Diagnosis not present

## 2020-11-22 DIAGNOSIS — M25562 Pain in left knee: Secondary | ICD-10-CM

## 2020-11-22 DIAGNOSIS — M25511 Pain in right shoulder: Secondary | ICD-10-CM | POA: Diagnosis not present

## 2020-11-22 DIAGNOSIS — N186 End stage renal disease: Secondary | ICD-10-CM | POA: Diagnosis not present

## 2020-11-22 DIAGNOSIS — Z992 Dependence on renal dialysis: Secondary | ICD-10-CM | POA: Diagnosis not present

## 2020-11-22 DIAGNOSIS — T8612 Kidney transplant failure: Secondary | ICD-10-CM | POA: Diagnosis not present

## 2020-11-22 MED ORDER — BUPIVACAINE HCL 0.5 % IJ SOLN
3.0000 mL | INTRAMUSCULAR | Status: AC | PRN
  Administered 2020-11-22: 3 mL via INTRA_ARTICULAR

## 2020-11-22 MED ORDER — LIDOCAINE HCL 1 % IJ SOLN
3.0000 mL | INTRAMUSCULAR | Status: AC | PRN
  Administered 2020-11-22: 3 mL

## 2020-11-22 MED ORDER — METHYLPREDNISOLONE ACETATE 40 MG/ML IJ SUSP
40.0000 mg | INTRAMUSCULAR | Status: AC | PRN
  Administered 2020-11-22: 40 mg via INTRA_ARTICULAR

## 2020-11-22 MED ORDER — BUPIVACAINE HCL 0.5 % IJ SOLN
2.0000 mL | INTRAMUSCULAR | Status: AC | PRN
  Administered 2020-11-22: 2 mL via INTRA_ARTICULAR

## 2020-11-22 MED ORDER — LIDOCAINE HCL 1 % IJ SOLN
2.0000 mL | INTRAMUSCULAR | Status: AC | PRN
  Administered 2020-11-22: 2 mL
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
## 2020-11-23 DIAGNOSIS — D509 Iron deficiency anemia, unspecified: Secondary | ICD-10-CM | POA: Diagnosis not present

## 2020-11-23 DIAGNOSIS — Z992 Dependence on renal dialysis: Secondary | ICD-10-CM | POA: Diagnosis not present

## 2020-11-23 DIAGNOSIS — N186 End stage renal disease: Secondary | ICD-10-CM | POA: Diagnosis not present

## 2020-11-23 DIAGNOSIS — N2581 Secondary hyperparathyroidism of renal origin: Secondary | ICD-10-CM | POA: Diagnosis not present

## 2020-11-23 DIAGNOSIS — D631 Anemia in chronic kidney disease: Secondary | ICD-10-CM | POA: Diagnosis not present

## 2020-11-25 DIAGNOSIS — N186 End stage renal disease: Secondary | ICD-10-CM | POA: Diagnosis not present

## 2020-11-25 DIAGNOSIS — Z992 Dependence on renal dialysis: Secondary | ICD-10-CM | POA: Diagnosis not present

## 2020-11-25 DIAGNOSIS — N2581 Secondary hyperparathyroidism of renal origin: Secondary | ICD-10-CM | POA: Diagnosis not present

## 2020-11-25 DIAGNOSIS — D509 Iron deficiency anemia, unspecified: Secondary | ICD-10-CM | POA: Diagnosis not present

## 2020-11-25 DIAGNOSIS — D631 Anemia in chronic kidney disease: Secondary | ICD-10-CM | POA: Diagnosis not present

## 2020-11-28 DIAGNOSIS — N2581 Secondary hyperparathyroidism of renal origin: Secondary | ICD-10-CM | POA: Diagnosis not present

## 2020-11-28 DIAGNOSIS — D509 Iron deficiency anemia, unspecified: Secondary | ICD-10-CM | POA: Diagnosis not present

## 2020-11-28 DIAGNOSIS — D631 Anemia in chronic kidney disease: Secondary | ICD-10-CM | POA: Diagnosis not present

## 2020-11-28 DIAGNOSIS — Z992 Dependence on renal dialysis: Secondary | ICD-10-CM | POA: Diagnosis not present

## 2020-11-28 DIAGNOSIS — N186 End stage renal disease: Secondary | ICD-10-CM | POA: Diagnosis not present

## 2020-11-29 ENCOUNTER — Other Ambulatory Visit: Payer: Self-pay | Admitting: Family

## 2020-11-29 DIAGNOSIS — F5101 Primary insomnia: Secondary | ICD-10-CM

## 2020-11-29 NOTE — Telephone Encounter (Signed)
High risk or very high risk warning populated when attempting to refill medication. RX request sent to PCP for review and approval if warranted.   

## 2020-11-30 DIAGNOSIS — D631 Anemia in chronic kidney disease: Secondary | ICD-10-CM | POA: Diagnosis not present

## 2020-11-30 DIAGNOSIS — D509 Iron deficiency anemia, unspecified: Secondary | ICD-10-CM | POA: Diagnosis not present

## 2020-11-30 DIAGNOSIS — N2581 Secondary hyperparathyroidism of renal origin: Secondary | ICD-10-CM | POA: Diagnosis not present

## 2020-11-30 DIAGNOSIS — N186 End stage renal disease: Secondary | ICD-10-CM | POA: Diagnosis not present

## 2020-11-30 DIAGNOSIS — Z992 Dependence on renal dialysis: Secondary | ICD-10-CM | POA: Diagnosis not present

## 2020-12-01 ENCOUNTER — Encounter (HOSPITAL_BASED_OUTPATIENT_CLINIC_OR_DEPARTMENT_OTHER): Payer: Self-pay | Admitting: Physical Therapy

## 2020-12-01 ENCOUNTER — Ambulatory Visit (HOSPITAL_BASED_OUTPATIENT_CLINIC_OR_DEPARTMENT_OTHER): Payer: Medicare Other | Attending: Neurosurgery | Admitting: Physical Therapy

## 2020-12-01 ENCOUNTER — Other Ambulatory Visit: Payer: Self-pay

## 2020-12-01 DIAGNOSIS — M542 Cervicalgia: Secondary | ICD-10-CM | POA: Insufficient documentation

## 2020-12-01 DIAGNOSIS — M6281 Muscle weakness (generalized): Secondary | ICD-10-CM | POA: Insufficient documentation
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
## 2020-12-02 DIAGNOSIS — D631 Anemia in chronic kidney disease: Secondary | ICD-10-CM | POA: Diagnosis not present

## 2020-12-02 DIAGNOSIS — N186 End stage renal disease: Secondary | ICD-10-CM | POA: Diagnosis not present

## 2020-12-02 DIAGNOSIS — Z992 Dependence on renal dialysis: Secondary | ICD-10-CM | POA: Diagnosis not present

## 2020-12-02 DIAGNOSIS — N2581 Secondary hyperparathyroidism of renal origin: Secondary | ICD-10-CM | POA: Diagnosis not present

## 2020-12-02 DIAGNOSIS — D509 Iron deficiency anemia, unspecified: Secondary | ICD-10-CM | POA: Diagnosis not present

## 2020-12-05 DIAGNOSIS — D631 Anemia in chronic kidney disease: Secondary | ICD-10-CM | POA: Diagnosis not present

## 2020-12-05 DIAGNOSIS — N2581 Secondary hyperparathyroidism of renal origin: Secondary | ICD-10-CM | POA: Diagnosis not present

## 2020-12-05 DIAGNOSIS — N186 End stage renal disease: Secondary | ICD-10-CM | POA: Diagnosis not present

## 2020-12-05 DIAGNOSIS — D509 Iron deficiency anemia, unspecified: Secondary | ICD-10-CM | POA: Diagnosis not present

## 2020-12-05 DIAGNOSIS — Z992 Dependence on renal dialysis: Secondary | ICD-10-CM | POA: Diagnosis not present

## 2020-12-06 ENCOUNTER — Ambulatory Visit (HOSPITAL_BASED_OUTPATIENT_CLINIC_OR_DEPARTMENT_OTHER): Payer: Medicare Other | Admitting: Physical Therapy

## 2020-12-06 ENCOUNTER — Other Ambulatory Visit: Payer: Self-pay

## 2020-12-06 ENCOUNTER — Encounter (HOSPITAL_BASED_OUTPATIENT_CLINIC_OR_DEPARTMENT_OTHER): Payer: Self-pay | Admitting: Physical Therapy

## 2020-12-06 DIAGNOSIS — M6281 Muscle weakness (generalized): Secondary | ICD-10-CM | POA: Diagnosis not present

## 2020-12-06 DIAGNOSIS — M542 Cervicalgia: Secondary | ICD-10-CM | POA: Diagnosis not present
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
## 2020-12-07 DIAGNOSIS — D509 Iron deficiency anemia, unspecified: Secondary | ICD-10-CM | POA: Diagnosis not present

## 2020-12-07 DIAGNOSIS — Z992 Dependence on renal dialysis: Secondary | ICD-10-CM | POA: Diagnosis not present

## 2020-12-07 DIAGNOSIS — N186 End stage renal disease: Secondary | ICD-10-CM | POA: Diagnosis not present

## 2020-12-07 DIAGNOSIS — D631 Anemia in chronic kidney disease: Secondary | ICD-10-CM | POA: Diagnosis not present

## 2020-12-07 DIAGNOSIS — N2581 Secondary hyperparathyroidism of renal origin: Secondary | ICD-10-CM | POA: Diagnosis not present

## 2020-12-08 ENCOUNTER — Encounter (HOSPITAL_BASED_OUTPATIENT_CLINIC_OR_DEPARTMENT_OTHER): Payer: Self-pay | Admitting: Physical Therapy

## 2020-12-08 ENCOUNTER — Ambulatory Visit (HOSPITAL_BASED_OUTPATIENT_CLINIC_OR_DEPARTMENT_OTHER): Payer: Medicare Other | Admitting: Physical Therapy

## 2020-12-08 ENCOUNTER — Other Ambulatory Visit: Payer: Self-pay

## 2020-12-08 DIAGNOSIS — M6281 Muscle weakness (generalized): Secondary | ICD-10-CM | POA: Diagnosis not present

## 2020-12-08 DIAGNOSIS — M542 Cervicalgia: Secondary | ICD-10-CM | POA: Diagnosis not present
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
## 2020-12-09 DIAGNOSIS — D631 Anemia in chronic kidney disease: Secondary | ICD-10-CM | POA: Diagnosis not present

## 2020-12-09 DIAGNOSIS — Z992 Dependence on renal dialysis: Secondary | ICD-10-CM | POA: Diagnosis not present

## 2020-12-09 DIAGNOSIS — D509 Iron deficiency anemia, unspecified: Secondary | ICD-10-CM | POA: Diagnosis not present

## 2020-12-09 DIAGNOSIS — N2581 Secondary hyperparathyroidism of renal origin: Secondary | ICD-10-CM | POA: Diagnosis not present

## 2020-12-09 DIAGNOSIS — N186 End stage renal disease: Secondary | ICD-10-CM | POA: Diagnosis not present

## 2020-12-12 DIAGNOSIS — N186 End stage renal disease: Secondary | ICD-10-CM | POA: Diagnosis not present

## 2020-12-12 DIAGNOSIS — N2581 Secondary hyperparathyroidism of renal origin: Secondary | ICD-10-CM | POA: Diagnosis not present

## 2020-12-12 DIAGNOSIS — D509 Iron deficiency anemia, unspecified: Secondary | ICD-10-CM | POA: Diagnosis not present

## 2020-12-12 DIAGNOSIS — Z992 Dependence on renal dialysis: Secondary | ICD-10-CM | POA: Diagnosis not present

## 2020-12-12 DIAGNOSIS — D631 Anemia in chronic kidney disease: Secondary | ICD-10-CM | POA: Diagnosis not present

## 2020-12-13 ENCOUNTER — Ambulatory Visit (HOSPITAL_BASED_OUTPATIENT_CLINIC_OR_DEPARTMENT_OTHER): Payer: Medicare Other | Admitting: Physical Therapy

## 2020-12-13 ENCOUNTER — Ambulatory Visit: Payer: Medicare Other | Admitting: Podiatry

## 2020-12-14 DIAGNOSIS — N2581 Secondary hyperparathyroidism of renal origin: Secondary | ICD-10-CM | POA: Diagnosis not present

## 2020-12-14 DIAGNOSIS — Z992 Dependence on renal dialysis: Secondary | ICD-10-CM | POA: Diagnosis not present

## 2020-12-14 DIAGNOSIS — D509 Iron deficiency anemia, unspecified: Secondary | ICD-10-CM | POA: Diagnosis not present

## 2020-12-14 DIAGNOSIS — D631 Anemia in chronic kidney disease: Secondary | ICD-10-CM | POA: Diagnosis not present

## 2020-12-14 DIAGNOSIS — N186 End stage renal disease: Secondary | ICD-10-CM | POA: Diagnosis not present

## 2020-12-17 DIAGNOSIS — N2581 Secondary hyperparathyroidism of renal origin: Secondary | ICD-10-CM | POA: Diagnosis not present

## 2020-12-17 DIAGNOSIS — D631 Anemia in chronic kidney disease: Secondary | ICD-10-CM | POA: Diagnosis not present

## 2020-12-17 DIAGNOSIS — D509 Iron deficiency anemia, unspecified: Secondary | ICD-10-CM | POA: Diagnosis not present

## 2020-12-17 DIAGNOSIS — Z992 Dependence on renal dialysis: Secondary | ICD-10-CM | POA: Diagnosis not present

## 2020-12-17 DIAGNOSIS — N186 End stage renal disease: Secondary | ICD-10-CM | POA: Diagnosis not present

## 2020-12-19 DIAGNOSIS — D509 Iron deficiency anemia, unspecified: Secondary | ICD-10-CM | POA: Diagnosis not present

## 2020-12-19 DIAGNOSIS — N2581 Secondary hyperparathyroidism of renal origin: Secondary | ICD-10-CM | POA: Diagnosis not present

## 2020-12-19 DIAGNOSIS — D631 Anemia in chronic kidney disease: Secondary | ICD-10-CM | POA: Diagnosis not present

## 2020-12-19 DIAGNOSIS — N186 End stage renal disease: Secondary | ICD-10-CM | POA: Diagnosis not present

## 2020-12-19 DIAGNOSIS — Z992 Dependence on renal dialysis: Secondary | ICD-10-CM | POA: Diagnosis not present

## 2020-12-20 ENCOUNTER — Ambulatory Visit (HOSPITAL_BASED_OUTPATIENT_CLINIC_OR_DEPARTMENT_OTHER): Payer: Medicare Other | Admitting: Physical Therapy

## 2020-12-20 DIAGNOSIS — H9211 Otorrhea, right ear: Secondary | ICD-10-CM | POA: Diagnosis not present

## 2020-12-20 DIAGNOSIS — H90A22 Sensorineural hearing loss, unilateral, left ear, with restricted hearing on the contralateral side: Secondary | ICD-10-CM | POA: Diagnosis not present

## 2020-12-20 DIAGNOSIS — Q8789 Other specified congenital malformation syndromes, not elsewhere classified: Secondary | ICD-10-CM | POA: Diagnosis not present

## 2020-12-20 DIAGNOSIS — Z9889 Other specified postprocedural states: Secondary | ICD-10-CM | POA: Diagnosis not present

## 2020-12-20 DIAGNOSIS — H61892 Other specified disorders of left external ear: Secondary | ICD-10-CM | POA: Diagnosis not present

## 2020-12-20 DIAGNOSIS — H7011 Chronic mastoiditis, right ear: Secondary | ICD-10-CM | POA: Diagnosis not present

## 2020-12-20 DIAGNOSIS — R42 Dizziness and giddiness: Secondary | ICD-10-CM | POA: Diagnosis not present

## 2020-12-20 DIAGNOSIS — H90A31 Mixed conductive and sensorineural hearing loss, unilateral, right ear with restricted hearing on the contralateral side: Secondary | ICD-10-CM | POA: Diagnosis not present

## 2020-12-20 DIAGNOSIS — Z886 Allergy status to analgesic agent status: Secondary | ICD-10-CM | POA: Diagnosis not present

## 2020-12-20 DIAGNOSIS — H7291 Unspecified perforation of tympanic membrane, right ear: Secondary | ICD-10-CM | POA: Diagnosis not present

## 2020-12-20 DIAGNOSIS — Z885 Allergy status to narcotic agent status: Secondary | ICD-10-CM | POA: Diagnosis not present

## 2020-12-21 DIAGNOSIS — D509 Iron deficiency anemia, unspecified: Secondary | ICD-10-CM | POA: Diagnosis not present

## 2020-12-21 DIAGNOSIS — N186 End stage renal disease: Secondary | ICD-10-CM | POA: Diagnosis not present

## 2020-12-21 DIAGNOSIS — D631 Anemia in chronic kidney disease: Secondary | ICD-10-CM | POA: Diagnosis not present

## 2020-12-21 DIAGNOSIS — Z992 Dependence on renal dialysis: Secondary | ICD-10-CM | POA: Diagnosis not present

## 2020-12-21 DIAGNOSIS — N2581 Secondary hyperparathyroidism of renal origin: Secondary | ICD-10-CM | POA: Diagnosis not present

## 2020-12-22 ENCOUNTER — Ambulatory Visit (HOSPITAL_BASED_OUTPATIENT_CLINIC_OR_DEPARTMENT_OTHER): Payer: Medicare Other | Admitting: Physical Therapy

## 2020-12-22 DIAGNOSIS — N186 End stage renal disease: Secondary | ICD-10-CM | POA: Diagnosis not present

## 2020-12-22 DIAGNOSIS — Z992 Dependence on renal dialysis: Secondary | ICD-10-CM | POA: Diagnosis not present

## 2020-12-22 DIAGNOSIS — T8612 Kidney transplant failure: Secondary | ICD-10-CM | POA: Diagnosis not present

## 2020-12-23 DIAGNOSIS — D509 Iron deficiency anemia, unspecified: Secondary | ICD-10-CM | POA: Diagnosis not present

## 2020-12-23 DIAGNOSIS — Z992 Dependence on renal dialysis: Secondary | ICD-10-CM | POA: Diagnosis not present

## 2020-12-23 DIAGNOSIS — N2581 Secondary hyperparathyroidism of renal origin: Secondary | ICD-10-CM | POA: Diagnosis not present

## 2020-12-23 DIAGNOSIS — D631 Anemia in chronic kidney disease: Secondary | ICD-10-CM | POA: Diagnosis not present

## 2020-12-23 DIAGNOSIS — N186 End stage renal disease: Secondary | ICD-10-CM | POA: Diagnosis not present

## 2020-12-26 ENCOUNTER — Ambulatory Visit (HOSPITAL_BASED_OUTPATIENT_CLINIC_OR_DEPARTMENT_OTHER): Payer: Medicare Other | Admitting: Physical Therapy

## 2020-12-26 DIAGNOSIS — N186 End stage renal disease: Secondary | ICD-10-CM | POA: Diagnosis not present

## 2020-12-26 DIAGNOSIS — N2581 Secondary hyperparathyroidism of renal origin: Secondary | ICD-10-CM | POA: Diagnosis not present

## 2020-12-26 DIAGNOSIS — Z992 Dependence on renal dialysis: Secondary | ICD-10-CM | POA: Diagnosis not present

## 2020-12-26 DIAGNOSIS — D631 Anemia in chronic kidney disease: Secondary | ICD-10-CM | POA: Diagnosis not present

## 2020-12-26 DIAGNOSIS — D509 Iron deficiency anemia, unspecified: Secondary | ICD-10-CM | POA: Diagnosis not present

## 2020-12-27 ENCOUNTER — Ambulatory Visit: Payer: Medicare Other | Admitting: Podiatry

## 2020-12-28 ENCOUNTER — Encounter: Payer: Self-pay | Admitting: Family

## 2020-12-28 ENCOUNTER — Other Ambulatory Visit: Payer: Self-pay

## 2020-12-28 ENCOUNTER — Ambulatory Visit (INDEPENDENT_AMBULATORY_CARE_PROVIDER_SITE_OTHER): Payer: Medicare Other | Admitting: Family

## 2020-12-28 VITALS — BP 130/80 | HR 87 | Temp 96.6°F | Resp 16 | Ht 67.0 in | Wt 216.0 lb

## 2020-12-28 DIAGNOSIS — M5412 Radiculopathy, cervical region: Secondary | ICD-10-CM

## 2020-12-28 DIAGNOSIS — Z992 Dependence on renal dialysis: Secondary | ICD-10-CM | POA: Diagnosis not present

## 2020-12-28 DIAGNOSIS — N186 End stage renal disease: Secondary | ICD-10-CM | POA: Diagnosis not present

## 2020-12-28 DIAGNOSIS — D631 Anemia in chronic kidney disease: Secondary | ICD-10-CM | POA: Diagnosis not present

## 2020-12-28 DIAGNOSIS — N2581 Secondary hyperparathyroidism of renal origin: Secondary | ICD-10-CM | POA: Diagnosis not present

## 2020-12-28 DIAGNOSIS — D509 Iron deficiency anemia, unspecified: Secondary | ICD-10-CM | POA: Diagnosis not present

## 2020-12-28 MED ORDER — SALONPAS LIDOCAINE PLUS 4-10 % EX CREA: 1.0000 | TOPICAL_CREAM | Freq: Every day | CUTANEOUS | 3 refills | Status: DC

## 2020-12-28 NOTE — Progress Notes (Signed)
Provider: Marlowe Sax FNP-C  Ciana Simmon, Nelda Bucks, NP  Patient Care Team: Sallye Lunz, Nelda Bucks, NP as PCP - General (Family Medicine) [name], MD as PCP - Cardiology (Cardiology) Jovita Kussmaul, MD as Consulting Physician (General Surgery) [name], MD as Consulting Physician (Pulmonary Disease) Estanislado Emms, MD (Inactive) as Consulting Physician (Nephrology) Elam Dutch, MD as Consulting Physician (Vascular Surgery) [name], MD as Consulting Physician (Cardiology) Milus Banister, MD as Attending Physician (Gastroenterology) Jamal Maes, MD as Consulting Physician (Nephrology)  Extended Emergency Contact Information Primary Emergency Contact: [facility] Address: [address]          [place], [place] [address] Johnnette Litter of [place] Phone: [phone] Mobile Phone: [phone] Relation: Spouse  Code Status: Full Code  Goals of care: Advanced Directive information Advanced Directives 12/28/2020  Does Patient Have a Medical Advance Directive? No  Does patient want to make changes to medical advance directive? -  Would patient like information on creating a medical advance directive? No - Patient declined  Pre-existing out of facility DNR order (yellow form or pink MOST form) -     Chief Complaint  Patient presents with   Medical Management of Chronic Issues    Discuss medical issues.     HPI:  Pt is a 60 y.o. male seen today for an acute visit for evaluation of neck pain.He is here with wife Carrol Bondar who provides most HPI information.Patient [place]  Wife states patient had water therapy then " land therapy" 3 weeks ago.states after the water therapy patient had worsening neck pain and dizziness. Pain is described as constant.unable to sleep at night.had episode of falling but everyone in the house has been watching on him whenever he tries to get up.states even the dogs have been sleeping next to him and wakes wife whenever he tries  to get out of the bed.  States dizziness has improved.  He follows up with [facility] [place] Dr.Grossi Collier [place] last seen 11/01/2020 Physical Therapy of the neck and back was ordered. 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)    Anxiety    Asthma    Asthma    moderate persistent   Carpal tunnel syndrome    Cellulitis and abscess of trunk    Cholelithiasis 07/13/2014   Chronic headaches    Debility, unspecified    Dermatophytosis of the body    Dysrhythmia    history of   Edema    End stage renal disease on dialysis ([place])    "MWF; [facility]" (10/21/2014)   Essential hypertension, benign    GERD (gastroesophageal reflux disease)    Gout, unspecified    HTN (hypertension)    Hypertrophy of prostate without urinary obstruction and other lower urinary tract symptoms (LUTS)    Hypotension, unspecified    Impotence of organic origin    Insomnia, unspecified    Kidney replaced by transplant    Localization-related (focal) (partial) epilepsy and epileptic syndromes with complex partial seizures, without mention of intractable epilepsy    12-15-19- Wife states  he has NEVER had a seizure    Lumbago    Memory loss    OSA on CPAP    Other and unspecified hyperlipidemia    controlled /managed per wife    Other chronic nonalcoholic liver disease    Other malaise and fatigue    Other nonspecific abnormal serum enzyme levels    Pain in joint, lower leg    Pain in joint, upper arm    Pneumonia "several times"   Renal dialysis status(V45.11) 02/05/2010   restarted 01/02/13 ofter renal trransplant failure   Secondary  hyperparathyroidism (of renal origin)    Shortness of breath    Sleep apnea    wears cpap    Tension headache    Unspecified constipation    Unspecified essential hypertension    Unspecified hereditary and idiopathic peripheral neuropathy    Unspecified vitamin D deficiency    Past Surgical History:  Procedure Laterality Date   AV FISTULA PLACEMENT Left ?2010   "forearm; at [facility]"   [place]  03/21/2011   CHOLECYSTECTOMY N/A 10/21/2014   Procedure: LAPAROSCOPIC CHOLECYSTECTOMY WITH INTRAOPERATIVE CHOLANGIOGRAM;  Surgeon: Autumn Messing III, MD;  Location: [facility];  Service: General;  Laterality: N/A;   COLONOSCOPY     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   KIDNEY TRANSPLANT  08/17/2011   [place]  10/21/2014   w/IOC   LEFT HEART CATHETERIZATION WITH CORONARY ANGIOGRAM N/A 03/21/2011   Procedure: LEFT HEART CATHETERIZATION WITH CORONARY ANGIOGRAM;  Surgeon: [name], MD;  Location: [facility] C[facility] LAB;  Service: Cardiovascular;  Laterality: N/A;   NEPHRECTOMY  08/2013   removed transplaned kidney   POLYPECTOMY     POSTERIOR FUSION CERVICAL SPINE  06/25/2012   for spinal stenosis   VASECTOMY  2010    Allergies  Allergen Reactions   Codeine Nausea And Vomiting   Hydrocodone-Acetaminophen Nausea And Vomiting    Outpatient Encounter Medications as of 12/28/2020  Medication Sig   acetaminophen (TYLENOL) 500 MG tablet Take 1 tablet (500 mg total) by mouth every 6 (six) hours as needed for moderate pain.   albuterol (PROVENTIL HFA;VENTOLIN HFA) 108 (90 Base) MCG/ACT inhaler Inhale 2 puffs into the lungs every 6 (six) hours as needed for wheezing or shortness of breath.   amLODipine (NORVASC) 10 MG tablet TAKE 1 TABLET BY MOUTH EVERY DAY   aspirin EC 81 MG tablet Take 81 mg by mouth daily.   b complex-vitamin c-folic acid (NEPHRO-VITE) 0.8 MG TABS tablet TAKE 1 TABLET BY MOUTH EVERY DAY    budesonide-formoterol (SYMBICORT) 160-4.5 MCG/ACT inhaler Inhale 2 puffs into the lungs 2 (two) times daily.   calcitRIOL (ROCALTROL) 0.5 MCG capsule Take 2 capsules (1 mcg total) by mouth every Monday, Wednesday, and Friday with hemodialysis.   chlorhexidine (PERIDEX) 0.12 % solution SMARTSIG:By Mouth   Cholecalciferol (VITAMIN D3) 25 MCG (1000 UT) CAPS Take 4,000 Units by mouth daily.   cinacalcet (SENSIPAR) 90 MG tablet Take 90 mg by mouth every evening.   ciprofloxacin-dexamethasone (CIPRODEX) OTIC suspension Place 1 drop into both ears 2 (two) times daily.   cloNIDine (CATAPRES) 0.1 MG tablet Take 0.1 mg by mouth at bedtime.   cyclobenzaprine (FLEXERIL) 5 MG tablet TAKE 1 TABLET BY MOUTH AT BEDTIME AS NEEDED FOR MUSCLE SPASMS.   diphenhydrAMINE (BENADRYL) 25 MG tablet Take 50 mg by mouth at bedtime.   doxazosin (CARDURA) 4 MG tablet Take  4 mg by mouth daily.   DULoxetine (CYMBALTA) 30 MG capsule TAKE 1 CAPSULE BY MOUTH EVERY DAY   famotidine (PEPCID) 20 MG tablet Take 1 tablet (20 mg total) by mouth at bedtime.   gabapentin (NEURONTIN) 100 MG capsule Take 1 capsule (100 mg total) by mouth 2 (two) times daily.   guaiFENesin (MUCINEX) 600 MG 12 hr tablet Take 1,200 mg by mouth 2 (two) times daily.   hydrALAZINE (APRESOLINE) 100 MG tablet TAKE 1 TABLET EVERY MORNINGAND AT BEDTIME.   ipratropium-albuterol (DUONEB) 0.5-2.5 (3) MG/3ML SOLN Take 3 mLs by nebulization every 4 (four) hours as needed (Shortness of breath).   isosorbide mononitrate (IMDUR) 30 MG 24 hr tablet TAKE 1 TABLET DAILY   lidocaine-prilocaine (EMLA) cream Apply 1 application topically See admin instructions. Apply 1 to 2 hours prior to dialysis on Mondays, Wednesdays, and Fridays. Cover with saran wrap.   metoprolol tartrate (LOPRESSOR) 100 MG tablet TAKE 1 TABLET BY MOUTH TWICE A DAY   omeprazole (PRILOSEC) 20 MG capsule TAKE 1 CAPSULE TWICE DAILY BEFORE A MEAL   ondansetron (ZOFRAN ODT) 4 MG disintegrating tablet Take 1  tablet (4 mg total) by mouth every 8 (eight) hours as needed for nausea or vomiting.   pravastatin (PRAVACHOL) 40 MG tablet TAKE 1 TABLET BY MOUTH EVERY DAY IN THE EVENING   PRESCRIPTION MEDICATION See admin instructions. CPAP- At bedtime and during any time of rest   RENVELA 800 MG tablet Take 800-2,400 mg by mouth See admin instructions. Take 2,400 mg by mouth three times a day with meals and 800 mg with each snack   traMADol (ULTRAM) 50 MG tablet Take 1 tablet (50 mg total) by mouth daily as needed.   traZODone (DESYREL) 50 MG tablet TAKE 0.5-1 TABLETS BY MOUTH AT BEDTIME AS NEEDED FOR SLEEP.   vitamin C (ASCORBIC ACID) 250 MG tablet Take 1 tablet (250 mg total) by mouth daily.   No facility-administered encounter medications on file as of 12/28/2020.    Review of Systems  Constitutional:  Negative for appetite change, chills, fatigue, fever and unexpected weight change.  HENT:  Positive for hearing loss. Negative for congestion, dental problem, ear discharge, ear pain, facial swelling, nosebleeds, postnasal drip, rhinorrhea, sinus pressure, sinus pain, sneezing, sore throat, tinnitus and trouble swallowing.   Eyes:  Negative for pain, discharge, redness, itching and visual disturbance.  Respiratory:  Negative for cough, chest tightness, shortness of breath and wheezing.   Cardiovascular:  Negative for chest pain, palpitations and leg swelling.  Gastrointestinal:  Negative for abdominal distention, abdominal pain, blood in stool, constipation, diarrhea, nausea and vomiting.  Genitourinary:        On dialysis three times per week   Musculoskeletal:  Positive for arthralgias, back pain, gait problem and neck pain. Negative for joint swelling, myalgias and neck stiffness.  Skin:  Negative for color change, pallor, rash and wound.  Neurological:  Positive for dizziness. Negative for syncope, speech difficulty, weakness, light-headedness, numbness and headaches.  Hematological:  Does not  bruise/bleed easily.  Psychiatric/Behavioral:  Negative for agitation, behavioral problems, confusion, hallucinations, self-injury, sleep disturbance and suicidal ideas. The patient is not nervous/anxious.    Immunization History  Administered Date(s) Administered   Influenza Whole 11/01/2010   Influenza, Seasonal, Injecte, Preservative Fre 10/29/2012   Influenza-Unspecified 11/21/2011, 11/05/2012, 09/22/2013, 09/22/2017, 11/23/2019, 10/28/2020   Moderna Sars-Covid-2 Vaccination 04/06/2019, 05/07/2019, 03/31/2020   PPD Test 12/26/2012   Pneumococcal Polysaccharide-23 11/01/2010, 10/29/2012   Pneumococcal-Unspecified 11/05/2012   Tdap 01/22/2006  Pertinent  Health Maintenance Due  Topic Date Due   HEMOGLOBIN A1C  05/04/2021   COLONOSCOPY (Pts 45-60yrs Insurance coverage will need to be confirmed)  12/29/2026   INFLUENZA VACCINE  Completed   FOOT EXAM  Discontinued   OPHTHALMOLOGY EXAM  Discontinued   Fall Risk 08/24/2020 09/06/2020 09/20/2020 11/03/2020 12/28/2020  Falls in the past year? 0 0 1 1 0  Was there an injury with Fall? 0 0 0 0 0  Was there an injury with Fall? - - - - -  Fall Risk Category Calculator 0 0 1 1 0  Fall Risk Category Low Low Low Low Low  Patient Fall Risk Level Low fall risk Low fall risk Moderate fall risk Low fall risk Low fall risk  Patient at Risk for Falls Due to No Fall Risks No Fall Risks - History of fall(s) No Fall Risks  Fall risk Follow up Falls evaluation completed Falls evaluation completed Falls evaluation completed Falls evaluation completed;Education provided;Falls prevention discussed Falls evaluation completed   Functional Status Survey:    Vitals:   12/28/20 1539  BP: 130/80  Pulse: 87  Resp: 16  Temp: (!) 96.6 F (35.9 C)  SpO2: 98%  Weight: 216 lb (98 kg)  Height: 5\' 7"  (1.702 m)   Body mass index is 33.83 kg/m. Physical Exam Vitals reviewed.  Constitutional:      General: He is not in acute distress.    Appearance: Normal  appearance. He is normal weight. He is not ill-appearing or diaphoretic.  HENT:     Head: Normocephalic.     Right Ear: Tympanic membrane, ear canal and external ear normal. There is no impacted cerumen.     Left Ear: Tympanic membrane, ear canal and external ear normal. There is no impacted cerumen.     Nose: Nose normal. No congestion or rhinorrhea.     Mouth/Throat:     Mouth: Mucous membranes are moist.     Pharynx: Oropharynx is clear. No oropharyngeal exudate or posterior oropharyngeal erythema.  Eyes:     General: No scleral icterus.       Right eye: No discharge.        Left eye: No discharge.     Extraocular Movements: Extraocular movements intact.     Conjunctiva/sclera: Conjunctivae normal.     Pupils: Pupils are equal, round, and reactive to light.  Neck:     Vascular: No carotid bruit.  Cardiovascular:     Rate and Rhythm: Normal rate and regular rhythm.     Pulses: Normal pulses.     Heart sounds: Normal heart sounds. No murmur heard.   No friction rub. No gallop.  Pulmonary:     Effort: Pulmonary effort is normal. No respiratory distress.     Breath sounds: Normal breath sounds. No wheezing, rhonchi or rales.  Chest:     Chest wall: No tenderness.  Abdominal:     General: Bowel sounds are normal. There is no distension.     Palpations: Abdomen is soft. There is no mass.     Tenderness: There is no abdominal tenderness. There is no right CVA tenderness, left CVA tenderness, guarding or rebound.  Musculoskeletal:        General: No swelling or tenderness.     Cervical back: No erythema, rigidity, torticollis, tenderness or crepitus. No spinous process tenderness or muscular tenderness.     Right lower leg: No edema.     Left lower leg: No edema.  Comments: Unsteady gait   Lymphadenopathy:     Cervical: No cervical adenopathy.  Skin:    General: Skin is warm and dry.     Coloration: Skin is not pale.     Findings: No bruising, erythema, lesion or rash.   Neurological:     Mental Status: He is alert and oriented to person, place, and time.     Cranial Nerves: No cranial nerve deficit.     Sensory: No sensory deficit.     Motor: No weakness.     Coordination: Coordination normal.     Gait: Gait abnormal.  Psychiatric:        Mood and Affect: Mood normal.        Speech: Speech normal.        Behavior: Behavior normal.        Thought Content: Thought content normal.        Judgment: Judgment normal.    Labs reviewed: Recent Labs    05/06/20 1516  NA 136  K 3.1*  CL 99  CO2 28  GLUCOSE 106*  BUN 18  CREATININE 6.51*  CALCIUM 9.6   Recent Labs    05/06/20 1516  AST 34  ALT 30  ALKPHOS 72  BILITOT 0.9  PROT 7.7  ALBUMIN 3.9   Recent Labs    03/03/20 0833 05/06/20 1516 11/03/20 1540  WBC 4.8 4.5 2.4*  NEUTROABS 2,928 2.7 1,008*  HGB 11.8* 12.6* 10.2*  HCT 35.7* 37.5* 30.6*  MCV 100.6* 104.7* 95.6  PLT 84* 85* 105*   Lab Results  Component Value Date   TSH 1.38 03/03/2020   Lab Results  Component Value Date   HGBA1C 5.8 (H) 11/03/2020   Lab Results  Component Value Date   CHOL 166 11/03/2020   HDL 28 (L) 11/03/2020   LDLCALC  11/03/2020     Comment:     . LDL cholesterol not calculated. Triglyceride levels greater than 400 mg/dL invalidate calculated LDL results. . Reference range: <100 . Desirable range <100 mg/dL for primary prevention;   <70 mg/dL for patients with CHD or diabetic patients  with > or = 2 CHD risk factors. Marland Kitchen LDL-C is now calculated using the Martin-Hopkins  calculation, which is a validated novel method providing  better accuracy than the Friedewald equation in the  estimation of LDL-C.  Cresenciano Genre et al. Annamaria Helling. 7829;562(13): 2061-2068  (http://education.[employer].com/faq/FAQ164)    TRIG 429 (H) 11/03/2020   CHOLHDL 5.9 (H) 11/03/2020    Significant Diagnostic Results in last 30 days:  No results found.  Assessment/Plan There are no diagnoses linked to this  encounter.1. Cervical radiculopathy Chronic neck pain but worsen with  therapy will rule out other abnormalities - DG Cervical Spine Complete; Future - Lidocaine HCl-Benzyl Alcohol (SALONPAS LIDOCAINE PLUS) 4-10 % CREA; Apply 1 patch topically daily.  Dispense: 85 g; Refill: 3  Family/ staff Communication: Reviewed plan of care with patient and wife verbalized understanding   Labs/tests ordered:  - DG Cervical Spine Complete; Future Next Appointment:   Sandrea Hughs, NP

## 2020-12-28 NOTE — Patient Instructions (Signed)
Please neck X-ray at [facility] then will call you with results.

## 2020-12-29 ENCOUNTER — Ambulatory Visit (HOSPITAL_BASED_OUTPATIENT_CLINIC_OR_DEPARTMENT_OTHER): Payer: Medicare Other | Admitting: Physical Therapy

## 2020-12-29 ENCOUNTER — Ambulatory Visit
Admission: RE | Admit: 2020-12-29 | Discharge: 2020-12-29 | Disposition: A | Discharge status: Home / Self Care | Payer: Medicare Other | Source: Ambulatory Visit | Attending: Family | Admitting: Family

## 2020-12-29 DIAGNOSIS — M2578 Osteophyte, vertebrae: Secondary | ICD-10-CM | POA: Diagnosis not present

## 2020-12-29 DIAGNOSIS — M47812 Spondylosis without myelopathy or radiculopathy, cervical region: Secondary | ICD-10-CM | POA: Diagnosis not present

## 2020-12-29 DIAGNOSIS — M5412 Radiculopathy, cervical region: Secondary | ICD-10-CM

## 2020-12-29 DIAGNOSIS — Z9889 Other specified postprocedural states: Secondary | ICD-10-CM | POA: Diagnosis not present

## 2020-12-29 DIAGNOSIS — Z981 Arthrodesis status: Secondary | ICD-10-CM | POA: Diagnosis not present

## 2020-12-30 DIAGNOSIS — D631 Anemia in chronic kidney disease: Secondary | ICD-10-CM | POA: Diagnosis not present

## 2020-12-30 DIAGNOSIS — Z992 Dependence on renal dialysis: Secondary | ICD-10-CM | POA: Diagnosis not present

## 2020-12-30 DIAGNOSIS — N2581 Secondary hyperparathyroidism of renal origin: Secondary | ICD-10-CM | POA: Diagnosis not present

## 2020-12-30 DIAGNOSIS — N186 End stage renal disease: Secondary | ICD-10-CM | POA: Diagnosis not present

## 2020-12-30 DIAGNOSIS — D509 Iron deficiency anemia, unspecified: Secondary | ICD-10-CM | POA: Diagnosis not present

## 2021-01-02 DIAGNOSIS — N186 End stage renal disease: Secondary | ICD-10-CM | POA: Diagnosis not present

## 2021-01-02 DIAGNOSIS — D631 Anemia in chronic kidney disease: Secondary | ICD-10-CM | POA: Diagnosis not present

## 2021-01-02 DIAGNOSIS — N2581 Secondary hyperparathyroidism of renal origin: Secondary | ICD-10-CM | POA: Diagnosis not present

## 2021-01-02 DIAGNOSIS — D509 Iron deficiency anemia, unspecified: Secondary | ICD-10-CM | POA: Diagnosis not present

## 2021-01-02 DIAGNOSIS — Z992 Dependence on renal dialysis: Secondary | ICD-10-CM | POA: Diagnosis not present

## 2021-01-03 ENCOUNTER — Ambulatory Visit (HOSPITAL_BASED_OUTPATIENT_CLINIC_OR_DEPARTMENT_OTHER): Payer: Medicare Other | Admitting: Physical Therapy

## 2021-01-04 DIAGNOSIS — D509 Iron deficiency anemia, unspecified: Secondary | ICD-10-CM | POA: Diagnosis not present

## 2021-01-04 DIAGNOSIS — N2581 Secondary hyperparathyroidism of renal origin: Secondary | ICD-10-CM | POA: Diagnosis not present

## 2021-01-04 DIAGNOSIS — Z992 Dependence on renal dialysis: Secondary | ICD-10-CM | POA: Diagnosis not present

## 2021-01-04 DIAGNOSIS — N186 End stage renal disease: Secondary | ICD-10-CM | POA: Diagnosis not present

## 2021-01-04 DIAGNOSIS — D631 Anemia in chronic kidney disease: Secondary | ICD-10-CM | POA: Diagnosis not present

## 2021-01-05 ENCOUNTER — Ambulatory Visit (HOSPITAL_BASED_OUTPATIENT_CLINIC_OR_DEPARTMENT_OTHER): Payer: Medicare Other | Admitting: Physical Therapy

## 2021-01-06 DIAGNOSIS — D509 Iron deficiency anemia, unspecified: Secondary | ICD-10-CM | POA: Diagnosis not present

## 2021-01-06 DIAGNOSIS — N186 End stage renal disease: Secondary | ICD-10-CM | POA: Diagnosis not present

## 2021-01-06 DIAGNOSIS — N2581 Secondary hyperparathyroidism of renal origin: Secondary | ICD-10-CM | POA: Diagnosis not present

## 2021-01-06 DIAGNOSIS — D631 Anemia in chronic kidney disease: Secondary | ICD-10-CM | POA: Diagnosis not present

## 2021-01-06 DIAGNOSIS — Z992 Dependence on renal dialysis: Secondary | ICD-10-CM | POA: Diagnosis not present

## 2021-01-09 ENCOUNTER — Inpatient Hospital Stay (HOSPITAL_COMMUNITY)
Admission: EM | Admit: 2021-01-09 | Discharge: 2021-01-11 | DRG: 391 | Disposition: A | Discharge status: Home / Self Care | Payer: Medicare Other | Attending: Internal Medicine | Admitting: Internal Medicine

## 2021-01-09 ENCOUNTER — Emergency Department (HOSPITAL_COMMUNITY): Payer: Medicare Other

## 2021-01-09 ENCOUNTER — Encounter (HOSPITAL_COMMUNITY): Payer: Self-pay

## 2021-01-09 DIAGNOSIS — E876 Hypokalemia: Secondary | ICD-10-CM | POA: Diagnosis not present

## 2021-01-09 DIAGNOSIS — N400 Enlarged prostate without lower urinary tract symptoms: Secondary | ICD-10-CM | POA: Diagnosis present

## 2021-01-09 DIAGNOSIS — G4733 Obstructive sleep apnea (adult) (pediatric): Secondary | ICD-10-CM | POA: Diagnosis not present

## 2021-01-09 DIAGNOSIS — Z87891 Personal history of nicotine dependence: Secondary | ICD-10-CM

## 2021-01-09 DIAGNOSIS — E1122 Type 2 diabetes mellitus with diabetic chronic kidney disease: Secondary | ICD-10-CM | POA: Diagnosis present

## 2021-01-09 DIAGNOSIS — Z7982 Long term (current) use of aspirin: Secondary | ICD-10-CM

## 2021-01-09 DIAGNOSIS — I120 Hypertensive chronic kidney disease with stage 5 chronic kidney disease or end stage renal disease: Secondary | ICD-10-CM | POA: Diagnosis not present

## 2021-01-09 DIAGNOSIS — G40209 Localization-related (focal) (partial) symptomatic epilepsy and epileptic syndromes with complex partial seizures, not intractable, without status epilepticus: Secondary | ICD-10-CM | POA: Diagnosis present

## 2021-01-09 DIAGNOSIS — Z905 Acquired absence of kidney: Secondary | ICD-10-CM

## 2021-01-09 DIAGNOSIS — E785 Hyperlipidemia, unspecified: Secondary | ICD-10-CM | POA: Diagnosis not present

## 2021-01-09 DIAGNOSIS — H9190 Unspecified hearing loss, unspecified ear: Secondary | ICD-10-CM | POA: Diagnosis present

## 2021-01-09 DIAGNOSIS — N281 Cyst of kidney, acquired: Secondary | ICD-10-CM | POA: Diagnosis not present

## 2021-01-09 DIAGNOSIS — N186 End stage renal disease: Secondary | ICD-10-CM | POA: Diagnosis not present

## 2021-01-09 DIAGNOSIS — Z6833 Body mass index (BMI) 33.0-33.9, adult: Secondary | ICD-10-CM

## 2021-01-09 DIAGNOSIS — M898X9 Other specified disorders of bone, unspecified site: Secondary | ICD-10-CM | POA: Diagnosis present

## 2021-01-09 DIAGNOSIS — M109 Gout, unspecified: Secondary | ICD-10-CM | POA: Diagnosis present

## 2021-01-09 DIAGNOSIS — K219 Gastro-esophageal reflux disease without esophagitis: Secondary | ICD-10-CM | POA: Diagnosis present

## 2021-01-09 DIAGNOSIS — I132 Hypertensive heart and chronic kidney disease with heart failure and with stage 5 chronic kidney disease, or end stage renal disease: Secondary | ICD-10-CM | POA: Diagnosis present

## 2021-01-09 DIAGNOSIS — E662 Morbid (severe) obesity with alveolar hypoventilation: Secondary | ICD-10-CM | POA: Diagnosis not present

## 2021-01-09 DIAGNOSIS — Z85828 Personal history of other malignant neoplasm of skin: Secondary | ICD-10-CM

## 2021-01-09 DIAGNOSIS — G4700 Insomnia, unspecified: Secondary | ICD-10-CM | POA: Diagnosis present

## 2021-01-09 DIAGNOSIS — Z7951 Long term (current) use of inhaled steroids: Secondary | ICD-10-CM

## 2021-01-09 DIAGNOSIS — E559 Vitamin D deficiency, unspecified: Secondary | ICD-10-CM | POA: Diagnosis present

## 2021-01-09 DIAGNOSIS — N261 Atrophy of kidney (terminal): Secondary | ICD-10-CM | POA: Diagnosis not present

## 2021-01-09 DIAGNOSIS — D631 Anemia in chronic kidney disease: Secondary | ICD-10-CM | POA: Diagnosis not present

## 2021-01-09 DIAGNOSIS — Z9989 Dependence on other enabling machines and devices: Secondary | ICD-10-CM

## 2021-01-09 DIAGNOSIS — M549 Dorsalgia, unspecified: Secondary | ICD-10-CM | POA: Diagnosis present

## 2021-01-09 DIAGNOSIS — D649 Anemia, unspecified: Secondary | ICD-10-CM | POA: Diagnosis not present

## 2021-01-09 DIAGNOSIS — Z992 Dependence on renal dialysis: Secondary | ICD-10-CM

## 2021-01-09 DIAGNOSIS — I5022 Chronic systolic (congestive) heart failure: Secondary | ICD-10-CM | POA: Diagnosis not present

## 2021-01-09 DIAGNOSIS — J45909 Unspecified asthma, uncomplicated: Secondary | ICD-10-CM | POA: Diagnosis present

## 2021-01-09 DIAGNOSIS — Z940 Kidney transplant status: Secondary | ICD-10-CM

## 2021-01-09 DIAGNOSIS — A084 Viral intestinal infection, unspecified: Principal | ICD-10-CM | POA: Diagnosis present

## 2021-01-09 DIAGNOSIS — N200 Calculus of kidney: Secondary | ICD-10-CM | POA: Diagnosis not present

## 2021-01-09 DIAGNOSIS — Q8781 Alport syndrome: Secondary | ICD-10-CM

## 2021-01-09 DIAGNOSIS — Z20822 Contact with and (suspected) exposure to covid-19: Secondary | ICD-10-CM | POA: Diagnosis present

## 2021-01-09 DIAGNOSIS — R319 Hematuria, unspecified: Secondary | ICD-10-CM | POA: Diagnosis not present

## 2021-01-09 DIAGNOSIS — I5042 Chronic combined systolic (congestive) and diastolic (congestive) heart failure: Secondary | ICD-10-CM | POA: Diagnosis present

## 2021-01-09 DIAGNOSIS — Z9852 Vasectomy status: Secondary | ICD-10-CM

## 2021-01-09 DIAGNOSIS — F339 Major depressive disorder, recurrent, unspecified: Secondary | ICD-10-CM | POA: Diagnosis present

## 2021-01-09 DIAGNOSIS — R1010 Upper abdominal pain, unspecified: Secondary | ICD-10-CM

## 2021-01-09 DIAGNOSIS — G8929 Other chronic pain: Secondary | ICD-10-CM | POA: Diagnosis present

## 2021-01-09 DIAGNOSIS — Z79899 Other long term (current) drug therapy: Secondary | ICD-10-CM

## 2021-01-09 DIAGNOSIS — I10 Essential (primary) hypertension: Secondary | ICD-10-CM | POA: Diagnosis present

## 2021-01-09 DIAGNOSIS — R109 Unspecified abdominal pain: Secondary | ICD-10-CM | POA: Diagnosis not present

## 2021-01-09 DIAGNOSIS — Z8719 Personal history of other diseases of the digestive system: Secondary | ICD-10-CM

## 2021-01-09 DIAGNOSIS — G609 Hereditary and idiopathic neuropathy, unspecified: Secondary | ICD-10-CM | POA: Diagnosis present

## 2021-01-09 DIAGNOSIS — R1110 Vomiting, unspecified: Secondary | ICD-10-CM | POA: Diagnosis not present

## 2021-01-09 DIAGNOSIS — Z885 Allergy status to narcotic agent status: Secondary | ICD-10-CM

## 2021-01-09 DIAGNOSIS — Z8673 Personal history of transient ischemic attack (TIA), and cerebral infarction without residual deficits: Secondary | ICD-10-CM

## 2021-01-09 DIAGNOSIS — F419 Anxiety disorder, unspecified: Secondary | ICD-10-CM | POA: Diagnosis not present

## 2021-01-09 DIAGNOSIS — D509 Iron deficiency anemia, unspecified: Secondary | ICD-10-CM | POA: Diagnosis not present

## 2021-01-09 DIAGNOSIS — J4530 Mild persistent asthma, uncomplicated: Secondary | ICD-10-CM | POA: Diagnosis not present

## 2021-01-09 DIAGNOSIS — N2581 Secondary hyperparathyroidism of renal origin: Secondary | ICD-10-CM | POA: Diagnosis not present

## 2021-01-09 DIAGNOSIS — T8612 Kidney transplant failure: Secondary | ICD-10-CM | POA: Diagnosis present

## 2021-01-09 DIAGNOSIS — I509 Heart failure, unspecified: Secondary | ICD-10-CM | POA: Diagnosis not present

## 2021-01-09 DIAGNOSIS — R112 Nausea with vomiting, unspecified: Secondary | ICD-10-CM | POA: Diagnosis not present

## 2021-01-09 DIAGNOSIS — Z981 Arthrodesis status: Secondary | ICD-10-CM

## 2021-01-09 DIAGNOSIS — E1121 Type 2 diabetes mellitus with diabetic nephropathy: Secondary | ICD-10-CM | POA: Diagnosis present

## 2021-01-09 LAB — RESP PANEL BY RT-PCR (FLU A&B, COVID) ARPGX2
Influenza A by PCR: NEGATIVE
Influenza B by PCR: NEGATIVE
SARS Coronavirus 2 by RT PCR: NEGATIVE

## 2021-01-09 LAB — COMPREHENSIVE METABOLIC PANEL
ALT: 19 U/L (ref 0–44)
AST: 25 U/L (ref 15–41)
Albumin: 4.2 g/dL (ref 3.5–5.0)
Alkaline Phosphatase: 97 U/L (ref 38–126)
Anion gap: 14 (ref 5–15)
BUN: 15 mg/dL (ref 6–20)
CO2: 27 mmol/L (ref 22–32)
Calcium: 9.4 mg/dL (ref 8.9–10.3)
Chloride: 96 mmol/L — ABNORMAL LOW (ref 98–111)
Creatinine, Ser: 7.84 mg/dL — ABNORMAL HIGH (ref 0.61–1.24)
GFR, Estimated: 7 mL/min — ABNORMAL LOW (ref >60)
Glucose, Bld: 158 mg/dL — ABNORMAL HIGH (ref 70–99)
Potassium: 3.3 mmol/L — ABNORMAL LOW (ref 3.5–5.1)
Sodium: 137 mmol/L (ref 135–145)
Total Bilirubin: 0.7 mg/dL (ref 0.3–1.2)
Total Protein: 8.1 g/dL (ref 6.5–8.1)

## 2021-01-09 LAB — CBC WITH DIFFERENTIAL/PLATELET
Abs Immature Granulocytes: 0.01 10*3/uL (ref 0.00–0.07)
Basophils Absolute: 0 10*3/uL (ref 0.0–0.1)
Basophils Relative: 1 %
Eosinophils Absolute: 0 10*3/uL (ref 0.0–0.5)
Eosinophils Relative: 1 %
HCT: 38.7 % — ABNORMAL LOW (ref 39.0–52.0)
Hemoglobin: 12.8 g/dL — ABNORMAL LOW (ref 13.0–17.0)
Immature Granulocytes: 0 %
Lymphocytes Relative: 15 %
Lymphs Abs: 0.6 10*3/uL — ABNORMAL LOW (ref 0.7–4.0)
MCH: 32.7 pg (ref 26.0–34.0)
MCHC: 33.1 g/dL (ref 30.0–36.0)
MCV: 99 fL (ref 80.0–100.0)
Monocytes Absolute: 0.5 10*3/uL (ref 0.1–1.0)
Monocytes Relative: 11 %
Neutro Abs: 3.1 10*3/uL (ref 1.7–7.7)
Neutrophils Relative %: 72 %
Platelets: 118 10*3/uL — ABNORMAL LOW (ref 150–400)
RBC: 3.91 MIL/uL — ABNORMAL LOW (ref 4.22–5.81)
RDW: 14.6 % (ref 11.5–15.5)
WBC: 4.2 10*3/uL (ref 4.0–10.5)
nRBC: 0 % (ref 0.0–0.2)

## 2021-01-09 LAB — MAGNESIUM: Magnesium: 2.1 mg/dL (ref 1.7–2.4)

## 2021-01-09 LAB — LIPASE, BLOOD: Lipase: 66 U/L — ABNORMAL HIGH (ref 11–51)

## 2021-01-09 LAB — TROPONIN I (HIGH SENSITIVITY)
Troponin I (High Sensitivity): 10 ng/L (ref <18)
Troponin I (High Sensitivity): 14 ng/L (ref <18)

## 2021-01-09 MED ORDER — MOMETASONE FURO-FORMOTEROL FUM 200-5 MCG/ACT IN AERO
2.0000 | INHALATION_SPRAY | Freq: Two times a day (BID) | RESPIRATORY_TRACT | Status: DC
  Administered 2021-01-09 – 2021-01-11 (×4): 2 via RESPIRATORY_TRACT
  Filled 2021-01-09: qty 8.8

## 2021-01-09 MED ORDER — CINACALCET HCL 30 MG PO TABS
90.0000 mg | ORAL_TABLET | Freq: Every day | ORAL | Status: DC
  Administered 2021-01-10: 19:00:00 90 mg via ORAL
  Filled 2021-01-09 (×2): qty 3

## 2021-01-09 MED ORDER — ALBUTEROL SULFATE (2.5 MG/3ML) 0.083% IN NEBU: 2.5000 mg | INHALATION_SOLUTION | Freq: Four times a day (QID) | RESPIRATORY_TRACT | Status: DC | PRN

## 2021-01-09 MED ORDER — METOPROLOL TARTRATE 50 MG PO TABS
100.0000 mg | ORAL_TABLET | Freq: Two times a day (BID) | ORAL | Status: DC
  Administered 2021-01-10 – 2021-01-11 (×3): 100 mg via ORAL
  Filled 2021-01-09 (×3): qty 4

## 2021-01-09 MED ORDER — DOXAZOSIN MESYLATE 4 MG PO TABS
4.0000 mg | ORAL_TABLET | Freq: Every day | ORAL | Status: DC
  Administered 2021-01-10 – 2021-01-11 (×2): 4 mg via ORAL
  Filled 2021-01-09 (×2): qty 1

## 2021-01-09 MED ORDER — INSULIN ASPART 100 UNIT/ML IJ SOLN
0.0000 [IU] | Freq: Three times a day (TID) | INTRAMUSCULAR | Status: DC
  Filled 2021-01-09: qty 0.06

## 2021-01-09 MED ORDER — CALCITRIOL 0.5 MCG PO CAPS
1.0000 ug | ORAL_CAPSULE | ORAL | Status: DC
Start: 2021-01-11 — End: 2021-01-11
  Administered 2021-01-11: 09:00:00 1 ug via ORAL
  Filled 2021-01-09: qty 2

## 2021-01-09 MED ORDER — PANTOPRAZOLE SODIUM 40 MG IV SOLR
40.0000 mg | Freq: Once | INTRAVENOUS | Status: AC
  Administered 2021-01-09: 19:00:00 40 mg via INTRAVENOUS
  Filled 2021-01-09: qty 40

## 2021-01-09 MED ORDER — ONDANSETRON HCL 4 MG/2ML IJ SOLN
4.0000 mg | Freq: Once | INTRAMUSCULAR | Status: AC
  Administered 2021-01-09: 20:00:00 4 mg via INTRAVENOUS
  Filled 2021-01-09: qty 2

## 2021-01-09 MED ORDER — SEVELAMER CARBONATE 800 MG PO TABS
2400.0000 mg | ORAL_TABLET | Freq: Three times a day (TID) | ORAL | Status: DC
  Administered 2021-01-10 – 2021-01-11 (×2): 2400 mg via ORAL
  Filled 2021-01-09 (×3): qty 3

## 2021-01-09 MED ORDER — HEPARIN SODIUM (PORCINE) 5000 UNIT/ML IJ SOLN
5000.0000 [IU] | Freq: Three times a day (TID) | INTRAMUSCULAR | Status: DC
  Filled 2021-01-09 (×2): qty 1

## 2021-01-09 MED ORDER — TRAZODONE HCL 50 MG PO TABS
50.0000 mg | ORAL_TABLET | Freq: Every evening | ORAL | Status: DC | PRN
  Administered 2021-01-09: 23:00:00 50 mg via ORAL
  Filled 2021-01-09: qty 1

## 2021-01-09 MED ORDER — PRAVASTATIN SODIUM 40 MG PO TABS
40.0000 mg | ORAL_TABLET | Freq: Every day | ORAL | Status: DC
  Administered 2021-01-10 – 2021-01-11 (×2): 40 mg via ORAL
  Filled 2021-01-09 (×2): qty 1
  Filled 2021-01-09: qty 2

## 2021-01-09 MED ORDER — ONDANSETRON 4 MG PO TBDP
4.0000 mg | ORAL_TABLET | Freq: Once | ORAL | Status: AC
  Administered 2021-01-09: 17:00:00 4 mg via ORAL
  Filled 2021-01-09: qty 1

## 2021-01-09 MED ORDER — ISOSORBIDE MONONITRATE ER 30 MG PO TB24: 30.0000 mg | ORAL_TABLET | Freq: Every day | ORAL | Status: DC

## 2021-01-09 MED ORDER — SODIUM CHLORIDE 0.9% FLUSH
3.0000 mL | Freq: Two times a day (BID) | INTRAVENOUS | Status: DC
  Administered 2021-01-09 – 2021-01-11 (×4): 3 mL via INTRAVENOUS

## 2021-01-09 MED ORDER — PANTOPRAZOLE SODIUM 40 MG PO TBEC
40.0000 mg | DELAYED_RELEASE_TABLET | Freq: Every day | ORAL | Status: DC
  Administered 2021-01-10 – 2021-01-11 (×2): 40 mg via ORAL
  Filled 2021-01-09 (×2): qty 1

## 2021-01-09 MED ORDER — ACETAMINOPHEN 325 MG PO TABS: 650.0000 mg | ORAL_TABLET | Freq: Four times a day (QID) | ORAL | Status: DC | PRN

## 2021-01-09 MED ORDER — DULOXETINE HCL 30 MG PO CPEP
30.0000 mg | ORAL_CAPSULE | Freq: Every day | ORAL | Status: DC
  Administered 2021-01-10 – 2021-01-11 (×2): 30 mg via ORAL
  Filled 2021-01-09 (×2): qty 1

## 2021-01-09 MED ORDER — ACETAMINOPHEN 650 MG RE SUPP: 650.0000 mg | Freq: Four times a day (QID) | RECTAL | Status: DC | PRN

## 2021-01-09 MED ORDER — AMLODIPINE BESYLATE 10 MG PO TABS
10.0000 mg | ORAL_TABLET | Freq: Every day | ORAL | Status: DC
  Administered 2021-01-10 – 2021-01-11 (×2): 10 mg via ORAL
  Filled 2021-01-09 (×2): qty 2

## 2021-01-09 MED ORDER — HYDROMORPHONE HCL 1 MG/ML IJ SOLN
0.5000 mg | Freq: Once | INTRAMUSCULAR | Status: AC
Start: 2021-01-09 — End: 2021-01-09
  Administered 2021-01-09: 19:00:00 0.5 mg via INTRAVENOUS
  Filled 2021-01-09: qty 1

## 2021-01-09 MED ORDER — SEVELAMER CARBONATE 800 MG PO TABS: 800.0000 mg | ORAL_TABLET | ORAL | Status: DC | PRN

## 2021-01-09 MED ORDER — ASPIRIN EC 81 MG PO TBEC
81.0000 mg | DELAYED_RELEASE_TABLET | Freq: Every day | ORAL | Status: DC
  Administered 2021-01-10 – 2021-01-11 (×2): 81 mg via ORAL
  Filled 2021-01-09 (×2): qty 1

## 2021-01-09 MED ORDER — CLONIDINE HCL 0.1 MG PO TABS
0.1000 mg | ORAL_TABLET | Freq: Every day | ORAL | Status: DC
  Administered 2021-01-09 – 2021-01-10 (×2): 0.1 mg via ORAL
  Filled 2021-01-09 (×2): qty 1

## 2021-01-09 NOTE — ED Provider Notes (Signed)
Emergency Medicine Provider Triage Evaluation Note  Kirk [name] , a 60 y.o. male  was evaluated in triage.  Pt complains of abdominal pain, back pain, vomiting starting after dialysis session today.  Patient states that during the end of his session he developed pain in his mid back.  He ended dialysis slightly early.  He does have a history of kidney stones.  He does make a small volume of urine, which today was bloody.  He reports having some chest pain during dialysis, not currently.  Actively vomiting in car on way to ED.  Review of Systems  Positive: Back pain, vomiting Negative: Fever  Physical Exam  BP (!) 209/91    Pulse (!) 104    Temp (!) 97.5 F (36.4 C) (Oral)    Resp 18    SpO2 95%  Gen:   Awake, appears uncomfortable Resp:  Normal effort  MSK:   Moves extremities without difficulty  Other:    Medical Decision Making  Medically screening exam initiated at 2:02 PM.  Appropriate orders placed.  Kirk [name] was informed that the remainder of the evaluation will be completed by another provider, this initial triage assessment does not replace that evaluation, and the importance of remaining in the ED until their evaluation is complete.     Carlisle Cater, PA-C 01/09/21 1403    Lacretia Leigh, MD 01/11/21 1705

## 2021-01-09 NOTE — ED Notes (Signed)
Pt in bed, sig other at bedside, pt c/o abd pain and back pain, states that he voided blood earlier today.  Pt states that he doesn't make much urine, and can't provide a sample at this time.
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
## 2021-01-09 NOTE — ED Triage Notes (Signed)
Pt presents with c/o abdominal pain and back pain. Pt's family reports that he came home from dialysis today with back pain and severe abdominal pain and vomiting. Pt has kidney stones, urinating blood.

## 2021-01-09 NOTE — ED Notes (Signed)
[facility] contacted 

## 2021-01-10 ENCOUNTER — Ambulatory Visit (HOSPITAL_BASED_OUTPATIENT_CLINIC_OR_DEPARTMENT_OTHER): Payer: Medicare Other | Admitting: Physical Therapy

## 2021-01-10 DIAGNOSIS — M898X9 Other specified disorders of bone, unspecified site: Secondary | ICD-10-CM | POA: Diagnosis present

## 2021-01-10 DIAGNOSIS — G40209 Localization-related (focal) (partial) symptomatic epilepsy and epileptic syndromes with complex partial seizures, not intractable, without status epilepticus: Secondary | ICD-10-CM | POA: Diagnosis present

## 2021-01-10 DIAGNOSIS — E876 Hypokalemia: Secondary | ICD-10-CM | POA: Diagnosis not present

## 2021-01-10 DIAGNOSIS — Q8781 Alport syndrome: Secondary | ICD-10-CM | POA: Diagnosis not present

## 2021-01-10 DIAGNOSIS — K219 Gastro-esophageal reflux disease without esophagitis: Secondary | ICD-10-CM | POA: Diagnosis present

## 2021-01-10 DIAGNOSIS — E1122 Type 2 diabetes mellitus with diabetic chronic kidney disease: Secondary | ICD-10-CM | POA: Diagnosis present

## 2021-01-10 DIAGNOSIS — G4700 Insomnia, unspecified: Secondary | ICD-10-CM | POA: Diagnosis present

## 2021-01-10 DIAGNOSIS — N2581 Secondary hyperparathyroidism of renal origin: Secondary | ICD-10-CM | POA: Diagnosis not present

## 2021-01-10 DIAGNOSIS — J45909 Unspecified asthma, uncomplicated: Secondary | ICD-10-CM | POA: Diagnosis present

## 2021-01-10 DIAGNOSIS — N200 Calculus of kidney: Secondary | ICD-10-CM | POA: Diagnosis present

## 2021-01-10 DIAGNOSIS — E785 Hyperlipidemia, unspecified: Secondary | ICD-10-CM | POA: Diagnosis present

## 2021-01-10 DIAGNOSIS — Z992 Dependence on renal dialysis: Secondary | ICD-10-CM | POA: Diagnosis not present

## 2021-01-10 DIAGNOSIS — I509 Heart failure, unspecified: Secondary | ICD-10-CM | POA: Diagnosis not present

## 2021-01-10 DIAGNOSIS — M109 Gout, unspecified: Secondary | ICD-10-CM | POA: Diagnosis present

## 2021-01-10 DIAGNOSIS — D631 Anemia in chronic kidney disease: Secondary | ICD-10-CM | POA: Diagnosis present

## 2021-01-10 DIAGNOSIS — Z940 Kidney transplant status: Secondary | ICD-10-CM | POA: Diagnosis not present

## 2021-01-10 DIAGNOSIS — Z20822 Contact with and (suspected) exposure to covid-19: Secondary | ICD-10-CM | POA: Diagnosis present

## 2021-01-10 DIAGNOSIS — E559 Vitamin D deficiency, unspecified: Secondary | ICD-10-CM | POA: Diagnosis present

## 2021-01-10 DIAGNOSIS — Z6833 Body mass index (BMI) 33.0-33.9, adult: Secondary | ICD-10-CM | POA: Diagnosis not present

## 2021-01-10 DIAGNOSIS — R112 Nausea with vomiting, unspecified: Secondary | ICD-10-CM | POA: Diagnosis not present

## 2021-01-10 DIAGNOSIS — R109 Unspecified abdominal pain: Secondary | ICD-10-CM | POA: Diagnosis not present

## 2021-01-10 DIAGNOSIS — I5042 Chronic combined systolic (congestive) and diastolic (congestive) heart failure: Secondary | ICD-10-CM | POA: Diagnosis present

## 2021-01-10 DIAGNOSIS — F419 Anxiety disorder, unspecified: Secondary | ICD-10-CM | POA: Diagnosis present

## 2021-01-10 DIAGNOSIS — I132 Hypertensive heart and chronic kidney disease with heart failure and with stage 5 chronic kidney disease, or end stage renal disease: Secondary | ICD-10-CM | POA: Diagnosis present

## 2021-01-10 DIAGNOSIS — A084 Viral intestinal infection, unspecified: Secondary | ICD-10-CM | POA: Diagnosis present

## 2021-01-10 DIAGNOSIS — R319 Hematuria, unspecified: Secondary | ICD-10-CM | POA: Diagnosis not present

## 2021-01-10 DIAGNOSIS — G609 Hereditary and idiopathic neuropathy, unspecified: Secondary | ICD-10-CM | POA: Diagnosis present

## 2021-01-10 DIAGNOSIS — G8929 Other chronic pain: Secondary | ICD-10-CM | POA: Diagnosis present

## 2021-01-10 DIAGNOSIS — E662 Morbid (severe) obesity with alveolar hypoventilation: Secondary | ICD-10-CM | POA: Diagnosis present

## 2021-01-10 DIAGNOSIS — N186 End stage renal disease: Secondary | ICD-10-CM | POA: Diagnosis present

## 2021-01-10 LAB — RENAL FUNCTION PANEL
Albumin: 3.7 g/dL (ref 3.5–5.0)
Anion gap: 13 (ref 5–15)
BUN: 18 mg/dL (ref 6–20)
CO2: 29 mmol/L (ref 22–32)
Calcium: 8.9 mg/dL (ref 8.9–10.3)
Chloride: 95 mmol/L — ABNORMAL LOW (ref 98–111)
Creatinine, Ser: 10.25 mg/dL — ABNORMAL HIGH (ref 0.61–1.24)
GFR, Estimated: 5 mL/min — ABNORMAL LOW (ref >60)
Glucose, Bld: 132 mg/dL — ABNORMAL HIGH (ref 70–99)
Phosphorus: 4.1 mg/dL (ref 2.5–4.6)
Potassium: 3.6 mmol/L (ref 3.5–5.1)
Sodium: 137 mmol/L (ref 135–145)

## 2021-01-10 LAB — CBC
HCT: 37.2 % — ABNORMAL LOW (ref 39.0–52.0)
Hemoglobin: 12 g/dL — ABNORMAL LOW (ref 13.0–17.0)
MCH: 32 pg (ref 26.0–34.0)
MCHC: 32.3 g/dL (ref 30.0–36.0)
MCV: 99.2 fL (ref 80.0–100.0)
Platelets: 104 10*3/uL — ABNORMAL LOW (ref 150–400)
RBC: 3.75 MIL/uL — ABNORMAL LOW (ref 4.22–5.81)
RDW: 14.7 % (ref 11.5–15.5)
WBC: 4.3 10*3/uL (ref 4.0–10.5)
nRBC: 0 % (ref 0.0–0.2)

## 2021-01-10 LAB — HIV ANTIBODY (ROUTINE TESTING W REFLEX): HIV Screen 4th Generation wRfx: NONREACTIVE

## 2021-01-10 LAB — GLUCOSE, CAPILLARY: Glucose-Capillary: 130 mg/dL — ABNORMAL HIGH (ref 70–99)

## 2021-01-10 MED ORDER — ONDANSETRON HCL 4 MG/2ML IJ SOLN
INTRAMUSCULAR | Status: AC
  Administered 2021-01-10: 10:00:00 4 mg via INTRAVENOUS
  Filled 2021-01-10: qty 2

## 2021-01-10 MED ORDER — ONDANSETRON HCL 4 MG/2ML IJ SOLN
4.0000 mg | Freq: Four times a day (QID) | INTRAMUSCULAR | Status: DC | PRN
  Administered 2021-01-10: 17:00:00 4 mg via INTRAVENOUS
  Filled 2021-01-10: qty 2

## 2021-01-10 MED ORDER — SENNOSIDES-DOCUSATE SODIUM 8.6-50 MG PO TABS: 1.0000 | ORAL_TABLET | Freq: Every day | ORAL | Status: DC | PRN

## 2021-01-10 NOTE — Progress Notes (Signed)
PROGRESS NOTE    Kirk [name]  RAQ:[id number] DOB: [birth year] DOA: 01/09/2021 PCP: Sandrea Hughs, NP   Chief Complaint  Patient presents with   Back Pain   Abdominal Pain  Brief Narrative/Hospital Course: Kirk [name], 60 y.o. male with PMH of  of ESRD on HD, Alport syndrome, anemia, anxiety, CHF, hypertension, GI bleed, hyperlipidemia, memory loss, asthma, OHS, OSA on CPAP, pancytopenia, diabetes who presents with ongoing nausea vomiting and abdominal pain.  Symptom onset during dialysis 12/19, stop dialysis 30 minutes earlier, then having abdominal pain several episodes of nausea and vomiting. In the ED vitals are stable although blood pressure is uncontrolled in up to 200, troponin negative x2 lipase normal to minimally elevated COVID-19 negative chest x-ray no acute finding CT renal study no acute finding  Subjective: Seen this morning report still having a lot of nausea had intractable nausea vomiting until midnight This morning able to eat some grit Family and wife at the bedside CPAP and cool air helps nausea and is trying to use that-he was told if he is vomiting he should not be using CPAP as it can cause aspiration  Assessment & Plan:  Intractable nausea and vomiting: Unclear etiology could be viral?-Patient reports she had 3 days of diarrhea that resolved 2 days ago.  LFTs stable lipase minimally elevated likely due to ESRD.  His symptoms got worsened by Dilaudid yesterday.  Still having a lot of nausea-add as needed IV Zofran, no abdominal pain except for epigastric soreness.  Denies use of NSAID, no history of GI ulcer or bleeding. Continue on PPI, full liquid diet advance as tolerated.  If he has persistent symptoms consult [facility], he is followed by Dr. Edison Nasuti  OSA on CPAP: Continue the same  ESRD and HD/history of failed kidney transplant: Had HD yesterday he remains here 1 more night consult nephrology in the morning for dialysis. Anemia of chronic renal  disease: Hemoglobin is stable.  Monitor Metabolic bone disease: Monitor calcium and Phos  Anxiety disorder continues duloxetine and trazodone  Chronic pain: Continue duloxetine, avoid narcotics.  Essential hypertension: Blood pressure poorly controlled in 200s on admission, now improved, continue amlodipine,anticoagulant doxazosin  History of CHF with preserved EF G1 DD manage fluid with dialysis  Type 2 diabetes patient denies history of diabetes A1c 5.3-prediabetes range  HLD continue pravastatin  Mild persistent chronic asthma without complication: No wheezing, continue inhaler as needed  Hypokalemia-resolved.  Class I Obesity:Patient's Body mass index is 32.6 kg/m. : Will benefit with PCP follow-up, weight loss  healthy lifestyle and outpatient sleep evaluation.  DVT prophylaxis: heparin injection 5,000 Units Start: 01/09/21 2200 Code Status:   Code Status: Full Code Family Communication: plan of care discussed with patient at bedside. Status is: Observation Clonidine, doxazosin and metoprolol, remains hospitalized due to ongoing nausea.Disposition: Currently not medically stable for discharge. Anticipated Disposition: TBD   Objective: Vitals last 24 hrs: Vitals:   01/09/21 2223 01/10/21 0521 01/10/21 0740 01/10/21 0804  BP: (!) 175/83 (!) 141/60  (!) 145/50  Pulse: 70 65  61  Resp: 20 16  17   Temp: (!) 97.4 F (36.3 C) (!) 97.4 F (36.3 C)  98.3 F (36.8 C)  TempSrc: Oral Oral    SpO2: 100% 98% 100% 100%  Weight: 94.4 kg      Weight change:  No intake or output data in the 24 hours ending 01/10/21 1053 Net IO Since Admission: No IO data has been entered for this period [01/10/21 1053]  Physical Examination: General exam: AA0X3, pleasant, NAD HEENT:Oral mucosa moist, Ear/Nose WNL grossly,dentition normal. Respiratory system: B/l CLEAR BS, no use of accessory muscle, non tender. Cardiovascular system: S1 & S2 +,No JVD. Gastrointestinal system: Abdomen soft,  mildly tender epigastrium,ND, BS+. Nervous System:Alert, awake, moving extremities. Extremities: edema none, distal peripheral pulses palpable.  Skin: No rashes, no icterus. MSK: Normal muscle bulk, tone, power.  Medications reviewed:  Scheduled Meds:  amLODipine  10 mg Oral Daily   aspirin EC  81 mg Oral Daily   [START ON 01/11/2021] calcitRIOL  1 mcg Oral Q M,W,F-HD   cinacalcet  90 mg Oral QAC supper   cloNIDine  0.1 mg Oral QHS   doxazosin  4 mg Oral Daily   DULoxetine  30 mg Oral Daily   heparin  5,000 Units Subcutaneous Q8H   insulin aspart  0-6 Units Subcutaneous TID WC   metoprolol tartrate  100 mg Oral BID   mometasone-formoterol  2 puff Inhalation BID   pantoprazole  40 mg Oral Daily   pravastatin  40 mg Oral Daily   sevelamer carbonate  2,400 mg Oral TID WC   sodium chloride flush  3 mL Intravenous Q12H   Continuous Infusions:  Diet Order             Diet full liquid Room service appropriate? Yes; Fluid consistency: Thin  Diet effective now                  Weight change:   Wt Readings from Last 3 Encounters:  01/09/21 94.4 kg  12/28/20 98 kg  11/03/20 99.7 kg  Consultants:see note  Procedures:see note Antimicrobials: Anti-infectives (From admission, onward)    None      Culture/Microbiology    Component Value Date/Time   SDES BLOOD RIGHT ARM 08/12/2018 0805   SPECREQUEST  08/12/2018 0805    BOTTLES DRAWN AEROBIC AND ANAEROBIC Blood Culture results may not be optimal due to an excessive volume of blood received in culture bottles   CULT  08/12/2018 0805    NO GROWTH 5 DAYS Performed at [facility] [address], [place], [place] [address]    REPTSTATUS 08/17/2018 FINAL 08/12/2018 0805    Other culture-see note  Unresulted Labs (From admission, onward)     Start     Ordered   01/09/21 1403  Urinalysis, Routine w reflex microscopic  Once,   R        01/09/21 1402          Data Reviewed: I have personally reviewed following  labs and imaging studies CBC: Recent Labs  Lab 01/09/21 1410 01/10/21 0345  WBC 4.2 4.3  NEUTROABS 3.1  --   HGB 12.8* 12.0*  HCT 38.7* 37.2*  MCV 99.0 99.2  PLT 118* 174*   Basic Metabolic Panel: Recent Labs  Lab 01/09/21 1410 01/09/21 2251 01/10/21 0345  NA 137  --  137  K 3.3*  --  3.6  CL 96*  --  95*  CO2 27  --  29  GLUCOSE 158*  --  132*  BUN 15  --  18  CREATININE 7.84*  --  10.25*  CALCIUM 9.4  --  8.9  MG  --  2.1  --   PHOS  --   --  4.1   GFR: Estimated Creatinine Clearance: 8.6 mL/min (A) (by C-G formula based on SCr of 10.25 mg/dL (H)). Liver Function Tests: Recent Labs  Lab 01/09/21 1410 01/10/21 0345  AST  25  --   ALT 19  --   ALKPHOS 97  --   BILITOT 0.7  --   PROT 8.1  --   ALBUMIN 4.2 3.7   Recent Labs  Lab 01/09/21 1410  LIPASE 66*   No results for input(s): AMMONIA in the last 168 hours. Coagulation Profile: No results for input(s): INR, PROTIME in the last 168 hours. Cardiac Enzymes: No results for input(s): CKTOTAL, CKMB, CKMBINDEX, TROPONINI in the last 168 hours. BNP (last 3 results) No results for input(s): PROBNP in the last 8760 hours. HbA1C: No results for input(s): HGBA1C in the last 72 hours. CBG: Recent Labs  Lab 01/10/21 0519  GLUCAP 130*   Lipid Profile: No results for input(s): CHOL, HDL, LDLCALC, TRIG, CHOLHDL, LDLDIRECT in the last 72 hours. Thyroid Function Tests: No results for input(s): TSH, T4TOTAL, FREET4, T3FREE, THYROIDAB in the last 72 hours. Anemia Panel: No results for input(s): VITAMINB12, FOLATE, FERRITIN, TIBC, IRON, RETICCTPCT in the last 72 hours. Sepsis Labs: No results for input(s): PROCALCITON, LATICACIDVEN in the last 168 hours.  Recent Results (from the past 240 hour(s))  Resp Panel by RT-PCR (Flu A&B, Covid) Nasopharyngeal Swab     Status: None   Collection Time: 01/09/21  8:07 PM   Specimen: Nasopharyngeal Swab; Nasopharyngeal(NP) swabs in vial transport medium  Result Value Ref  Range Status   SARS Coronavirus 2 by RT PCR NEGATIVE NEGATIVE Final    Comment: (NOTE) SARS-CoV-2 target nucleic acids are NOT DETECTED.  The SARS-CoV-2 RNA is generally detectable in upper respiratory specimens during the acute phase of infection. The lowest concentration of SARS-CoV-2 viral copies this assay can detect is 138 copies/mL. A negative result does not preclude SARS-Cov-2 infection and should not be used as the sole basis for treatment or other patient management decisions. A negative result may occur with  improper specimen collection/handling, submission of specimen other than nasopharyngeal swab, presence of viral mutation(s) within the areas targeted by this assay, and inadequate number of viral copies(<138 copies/mL). A negative result must be combined with clinical observations, patient history, and epidemiological information. The expected result is Negative.  Fact Sheet for Patients:  EntrepreneurPulse.com.au  Fact Sheet for Healthcare Providers:  IncredibleEmployment.be  This test is no t yet approved or cleared by the [place] FDA and  has been authorized for detection and/or diagnosis of SARS-CoV-2 by FDA under an Emergency Use Authorization (EUA). This EUA will remain  in effect (meaning this test can be used) for the duration of the COVID-19 declaration under Section 564(b)(1) of the Act, 21 U.S.C.section 360bbb-3(b)(1), unless the authorization is terminated  or revoked sooner.       Influenza A by PCR NEGATIVE NEGATIVE Final   Influenza B by PCR NEGATIVE NEGATIVE Final    Comment: (NOTE) The Xpert Xpress SARS-CoV-2/FLU/RSV plus assay is intended as an aid in the diagnosis of influenza from Nasopharyngeal swab specimens and should not be used as a sole basis for treatment. Nasal washings and aspirates are unacceptable for Xpert Xpress SARS-CoV-2/FLU/RSV testing.  Fact Sheet for  Patients: EntrepreneurPulse.com.au  Fact Sheet for Healthcare Providers: IncredibleEmployment.be  This test is not yet approved or cleared by the [place] FDA and has been authorized for detection and/or diagnosis of SARS-CoV-2 by FDA under an Emergency Use Authorization (EUA). This EUA will remain in effect (meaning this test can be used) for the duration of the COVID-19 declaration under Section 564(b)(1) of the Act, 21 U.S.C. section 360bbb-3(b)(1), unless the authorization  is terminated or revoked.  Performed at [facility], [place] [address] [place], [place] [address]      Radiology Studies: DG Chest 2 View  Result Date: 01/09/2021 CLINICAL DATA:  Back pain, vomiting EXAM: CHEST - 2 VIEW COMPARISON:  09/20/2020 FINDINGS: The heart size and mediastinal contours are within normal limits. Atherosclerotic calcification of the aortic knob. Apical lordotic alignment on AP view with superimposed bilateral first rib costochondral junctions projecting over the lung apices. No focal airspace consolidation, pleural effusion, or pneumothorax. The visualized skeletal structures are unremarkable. IMPRESSION: No active cardiopulmonary disease. Electronically Signed   By: Davina Poke D.O.   On: 01/09/2021 14:27   CT Renal Stone Study  Result Date: 01/09/2021 CLINICAL DATA:  Abdominal and back pain, vomiting, kidney stones, urinating blood, dialysis patient EXAM: CT ABDOMEN AND PELVIS WITHOUT CONTRAST TECHNIQUE: Multidetector CT imaging of the abdomen and pelvis was performed following the standard protocol without IV contrast. COMPARISON:  05/06/2020 FINDINGS: Lower chest: Chronic reticulonodular opacities in the lower lobes. Hepatobiliary: Gallbladder surgically absent.  Liver unremarkable. Pancreas: Normal appearance Spleen: Mildly enlarged, 12.6 x 13.3 x 5.8 cm (volume = 510 cm^3). No focal abnormality. Adrenals/Urinary Tract: Adrenal  glands normal appearance. Marked BILATERAL renal cortical atrophy, renal cysts, and BILATERAL nonobstructing renal calculi. No hydronephrosis or hydroureter. No ureteral calcifications. Bladder decompressed, grossly unchanged. Calcified mass in the RIGHT hemipelvis 3.3 x 1.7 x 7.0 cm consistent with old failed renal transplant, densely calcified and atretic. Stomach/Bowel: Normal appendix. Stomach and bowel loops normal appearance. Vascular/Lymphatic: Atherosclerotic calcifications aorta, iliac arteries, femoral arteries. Aorta normal caliber. No adenopathy. Reproductive: Enlarged prostate gland 5.3 x 4.9 x 3.4 cm (volume = 46 cm^3) Other: No free air or free fluid. No hernia or inflammatory process. Musculoskeletal: Scattered Schmorl's nodes. 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 IMPRESSION: BILATERAL renal cortical atrophy, renal cysts, and BILATERAL nonobstructing renal calculi. Old failed calcified renal transplant in RIGHT iliac fossa. Mild splenomegaly. Prostatic enlargement. Chronic reticulonodular opacities in the lower lobes. No acute intra-abdominal or intrapelvic abnormalities. Aortic Atherosclerosis (ICD10-I70.0). Electronically Signed   By: Lavonia Dana M.D.   On: 01/09/2021 15:12     LOS: 0 days   Antonieta Pert, MD [facility]  01/10/2021, 10:53 AM

## 2021-01-10 NOTE — Progress Notes (Signed)
Pt stated he can place himself on CPAP without assistance. RT will monitor as needed.

## 2021-01-10 NOTE — Progress Notes (Signed)
Patient continues to refuse mediation at this time due to nausea. Will reschedule for after lunch.

## 2021-01-11 DIAGNOSIS — R112 Nausea with vomiting, unspecified: Secondary | ICD-10-CM | POA: Diagnosis not present

## 2021-01-11 LAB — CBC
HCT: 35.4 % — ABNORMAL LOW (ref 39.0–52.0)
Hemoglobin: 11.6 g/dL — ABNORMAL LOW (ref 13.0–17.0)
MCH: 33 pg (ref 26.0–34.0)
MCHC: 32.8 g/dL (ref 30.0–36.0)
MCV: 100.9 fL — ABNORMAL HIGH (ref 80.0–100.0)
Platelets: 98 10*3/uL — ABNORMAL LOW (ref 150–400)
RBC: 3.51 MIL/uL — ABNORMAL LOW (ref 4.22–5.81)
RDW: 15 % (ref 11.5–15.5)
WBC: 3.6 10*3/uL — ABNORMAL LOW (ref 4.0–10.5)
nRBC: 0 % (ref 0.0–0.2)

## 2021-01-11 LAB — COMPREHENSIVE METABOLIC PANEL
ALT: 18 U/L (ref 0–44)
AST: 20 U/L (ref 15–41)
Albumin: 3.4 g/dL — ABNORMAL LOW (ref 3.5–5.0)
Alkaline Phosphatase: 84 U/L (ref 38–126)
Anion gap: 11 (ref 5–15)
BUN: 24 mg/dL — ABNORMAL HIGH (ref 6–20)
CO2: 30 mmol/L (ref 22–32)
Calcium: 8.7 mg/dL — ABNORMAL LOW (ref 8.9–10.3)
Chloride: 94 mmol/L — ABNORMAL LOW (ref 98–111)
Creatinine, Ser: 13.05 mg/dL — ABNORMAL HIGH (ref 0.61–1.24)
GFR, Estimated: 4 mL/min — ABNORMAL LOW (ref >60)
Glucose, Bld: 115 mg/dL — ABNORMAL HIGH (ref 70–99)
Potassium: 3.8 mmol/L (ref 3.5–5.1)
Sodium: 135 mmol/L (ref 135–145)
Total Bilirubin: 0.8 mg/dL (ref 0.3–1.2)
Total Protein: 6.6 g/dL (ref 6.5–8.1)

## 2021-01-11 LAB — HEPATITIS B SURFACE ANTIGEN: Hepatitis B Surface Ag: NONREACTIVE

## 2021-01-11 LAB — HEPATITIS B SURFACE ANTIBODY,QUALITATIVE: Hep B S Ab: REACTIVE — AB

## 2021-01-11 MED ORDER — SODIUM CHLORIDE 0.9 % IV SOLN: 100.0000 mL | INTRAVENOUS | Status: DC | PRN

## 2021-01-11 MED ORDER — ALTEPLASE 2 MG IJ SOLR: 2.0000 mg | Freq: Once | INTRAMUSCULAR | Status: DC | PRN

## 2021-01-11 MED ORDER — PENTAFLUOROPROP-TETRAFLUOROETH EX AERO: 1.0000 "application " | INHALATION_SPRAY | CUTANEOUS | Status: DC | PRN

## 2021-01-11 MED ORDER — HEPARIN SODIUM (PORCINE) 1000 UNIT/ML DIALYSIS: 1000.0000 [IU] | INTRAMUSCULAR | Status: DC | PRN

## 2021-01-11 MED ORDER — CHLORHEXIDINE GLUCONATE CLOTH 2 % EX PADS: 6.0000 | MEDICATED_PAD | Freq: Every day | CUTANEOUS | Status: DC

## 2021-01-11 MED ORDER — ONDANSETRON 4 MG PO TBDP: 4.0000 mg | ORAL_TABLET | Freq: Three times a day (TID) | ORAL | 0 refills | Status: DC | PRN

## 2021-01-11 MED ORDER — LIDOCAINE HCL (PF) 1 % IJ SOLN
5.0000 mL | INTRAMUSCULAR | Status: DC | PRN
  Filled 2021-01-11: qty 5

## 2021-01-11 MED ORDER — LIDOCAINE-PRILOCAINE 2.5-2.5 % EX CREA
1.0000 "application " | TOPICAL_CREAM | CUTANEOUS | Status: DC | PRN
  Filled 2021-01-11: qty 5

## 2021-01-11 MED ORDER — ENSURE ENLIVE PO LIQD: 237.0000 mL | Freq: Two times a day (BID) | ORAL | Status: DC

## 2021-01-11 MED ORDER — RENA-VITE PO TABS: 1.0000 | ORAL_TABLET | Freq: Every day | ORAL | Status: DC

## 2021-01-11 NOTE — Progress Notes (Signed)
Pt back on unit at 2:15pm

## 2021-01-11 NOTE — Procedures (Signed)
I was present at this dialysis session. I have reviewed the session itself and made appropriate changes.   Vital signs in last 24 hours:  Temp:  [97.6 F (36.4 C)-98.1 F (36.7 C)] 97.6 F (36.4 C) (12/21 0756) Pulse Rate:  [62-64] 62 (12/21 0756) Resp:  [16-17] 17 (12/21 0756) BP: (133-154)/(59-80) 154/80 (12/21 0756) SpO2:  [96 %-100 %] 100 % (12/21 0756) Weight change:  Filed Weights   01/09/21 2223  Weight: 94.4 kg    Recent Labs  Lab 01/10/21 0345 01/11/21 0345  NA 137 135  K 3.6 3.8  CL 95* 94*  CO2 29 30  GLUCOSE 132* 115*  BUN 18 24*  CREATININE 10.25* 13.05*  CALCIUM 8.9 8.7*  PHOS 4.1  --     Recent Labs  Lab 01/09/21 1410 01/10/21 0345 01/11/21 0345  WBC 4.2 4.3 3.6*  NEUTROABS 3.1  --   --   HGB 12.8* 12.0* 11.6*  HCT 38.7* 37.2* 35.4*  MCV 99.0 99.2 100.9*  PLT 118* 104* 98*    Scheduled Meds:  amLODipine  10 mg Oral Daily   aspirin EC  81 mg Oral Daily   calcitRIOL  1 mcg Oral Q M,W,F-HD   Chlorhexidine Gluconate Cloth  6 each Topical Q0600   cinacalcet  90 mg Oral QAC supper   cloNIDine  0.1 mg Oral QHS   doxazosin  4 mg Oral Daily   DULoxetine  30 mg Oral Daily   heparin  5,000 Units Subcutaneous Q8H   insulin aspart  0-6 Units Subcutaneous TID WC   metoprolol tartrate  100 mg Oral BID   mometasone-formoterol  2 puff Inhalation BID   pantoprazole  40 mg Oral Daily   pravastatin  40 mg Oral Daily   sevelamer carbonate  2,400 mg Oral TID WC   sodium chloride flush  3 mL Intravenous Q12H   Continuous Infusions:  sodium chloride     sodium chloride     PRN Meds:.sodium chloride, sodium chloride, acetaminophen **OR** acetaminophen, albuterol, alteplase, heparin, lidocaine (PF), lidocaine-prilocaine, ondansetron (ZOFRAN) IV, pentafluoroprop-tetrafluoroeth, senna-docusate, sevelamer carbonate, traZODone   Donetta Potts,  MD 01/11/2021, 9:16 AM

## 2021-01-11 NOTE — Progress Notes (Signed)
Initial Nutrition Assessment  DOCUMENTATION CODES:   Obesity unspecified  INTERVENTION:  - Encourage PO intake  - Ensure Enlive po BID, each supplement provides 350 kcal and 20 grams of protein  - Renal MVI daily  NUTRITION DIAGNOSIS:   Increased nutrient needs related to acute illness as evidenced by estimated needs.  GOAL:   Patient will meet greater than or equal to 90% of their needs  MONITOR:   PO intake  REASON FOR ASSESSMENT:   Malnutrition Screening Tool    ASSESSMENT:   Pt admitted from home with n/v/abdominal pain, etiology unknown. PMH includes ESRD on HD, Alport syndrome, anemia, anxiety, CHF, HTN, GI bleed, hyperlipidemia, memory loss, asthma, OHS, OSA, and pancytopenia.  Chest xray and CT renal both with no acute findings  Pt resting after recently coming back from dialysis. Spoke with pt's wife over the phone. She states that has not had much to eat in the past 2 days but his appetite is starting to improve. For lunch today he had meatloaf, gravy, baked potato, string beans and carrots and ate all except a few bites of string beans and potato. So far he has been able to tolerate the food which she states will hopefully determine if he gets to go home.   At home he has a good appetite although some days may be a little less than others. His meal intake is the same on dialysis and non-dialysis days. He eats 3 meals a day. Breakfast consists of oatmeal, grits, eggs, 1 piece of sausage. Lunch consists of soup and crackers. Dinner consists of tuna or grilled fish, or a grilled hamburger.   At home he usually takes the nephro-vite MVI. He does not usually use a nutrition supplement or protein bar.   She reports his usual dry weight is 97 kg. Upon admission his weight was 94.4 kg. Today, his post dialysis weight is noted to be 96.3 kg.    Medications: calcitriol (MWF), cinacalcet, doxazosin, SSI, protonix, renvela  Labs: BUN 24, Cr 13.05, CBG's 102-146 x 24  hours UOP: 23mL x 24 hours  HD net UF: 562mL  I/O's: -163mL since admission  NUTRITION - FOCUSED PHYSICAL EXAM: RD working remotely. Needs NFPE at follow up.  Diet Order:   Diet Order             Diet 2 gram sodium Room service appropriate? Yes; Fluid consistency: Thin  Diet effective now           Diet - low sodium heart healthy                   EDUCATION NEEDS:   Education needs have been addressed  Skin:  Skin Assessment: Reviewed RN Assessment  Last BM:  PTA  Height:   Ht Readings from Last 1 Encounters:  12/28/20 5\' 7"  (1.702 m)    Weight:   Wt Readings from Last 1 Encounters:  01/11/21 96.3 kg    Ideal Body Weight:  67.2 kg  BMI:  Body mass index is 33.25 kg/m.  Estimated Nutritional Needs:   Kcal:  1900-2100  Protein:  95-105g  Fluid:  1L + UOP  Clayborne Dana, RDN, LDN Clinical Nutrition
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
## 2021-01-12 ENCOUNTER — Telehealth: Payer: Self-pay | Admitting: *Deleted

## 2021-01-12 ENCOUNTER — Ambulatory Visit (HOSPITAL_BASED_OUTPATIENT_CLINIC_OR_DEPARTMENT_OTHER): Payer: Medicare Other | Admitting: Physical Therapy

## 2021-01-12 LAB — HEPATITIS B SURFACE ANTIBODY, QUANTITATIVE: Hep B S AB Quant (Post): 30.1 m[IU]/mL (ref >9.9)

## 2021-01-12 NOTE — TOC Transition Note (Signed)
Transition of care contact from inpatient facility  Date of discharge: 01/11/21 Date of contact: 01/12/21 Method: Attempted Phone Call Spoke to: No Answer  Attempted to contact patient to discuss transition of care from recent inpatient hospitalization but patient did not pick up the phone. Left voicemail to reach back out to [facility] HD unit [phone] for any questions or concerns.   Renal team will follow up with patient at [facility] on next schedule HD treatment.  Tobie Poet, NP

## 2021-01-12 NOTE — Telephone Encounter (Signed)
Transition Care Management Unsuccessful Follow-up Telephone Call  Date of discharge and from where:  01/11/2021 [place]  Attempts:  1st Attempt  Reason for unsuccessful TCM follow-up call:  Left voice message

## 2021-01-13 DIAGNOSIS — N2581 Secondary hyperparathyroidism of renal origin: Secondary | ICD-10-CM | POA: Diagnosis not present

## 2021-01-13 DIAGNOSIS — N186 End stage renal disease: Secondary | ICD-10-CM | POA: Diagnosis not present

## 2021-01-13 DIAGNOSIS — D509 Iron deficiency anemia, unspecified: Secondary | ICD-10-CM | POA: Diagnosis not present

## 2021-01-13 DIAGNOSIS — D631 Anemia in chronic kidney disease: Secondary | ICD-10-CM | POA: Diagnosis not present

## 2021-01-13 DIAGNOSIS — Z992 Dependence on renal dialysis: Secondary | ICD-10-CM | POA: Diagnosis not present

## 2021-01-13 NOTE — Telephone Encounter (Signed)
Transition Care Management Unsuccessful Follow-up Telephone Call  Date of discharge and from where:  01/11/2021 [place]  Attempts:  2nd Attempt  Reason for unsuccessful TCM follow-up call:  Left voice message to return call.

## 2021-01-16 DIAGNOSIS — D509 Iron deficiency anemia, unspecified: Secondary | ICD-10-CM | POA: Diagnosis not present

## 2021-01-16 DIAGNOSIS — N2581 Secondary hyperparathyroidism of renal origin: Secondary | ICD-10-CM | POA: Diagnosis not present

## 2021-01-16 DIAGNOSIS — N186 End stage renal disease: Secondary | ICD-10-CM | POA: Diagnosis not present

## 2021-01-16 DIAGNOSIS — D631 Anemia in chronic kidney disease: Secondary | ICD-10-CM | POA: Diagnosis not present

## 2021-01-16 DIAGNOSIS — Z992 Dependence on renal dialysis: Secondary | ICD-10-CM | POA: Diagnosis not present

## 2021-01-17 ENCOUNTER — Ambulatory Visit (HOSPITAL_BASED_OUTPATIENT_CLINIC_OR_DEPARTMENT_OTHER): Payer: Medicare Other | Admitting: Physical Therapy

## 2021-01-17 NOTE — Telephone Encounter (Signed)
Transition Care Management Unsuccessful Follow-up Telephone Call  Date of discharge and from where:  01/11/2021 [place]  Attempts:  3rd Attempt  Reason for unsuccessful TCM follow-up call:  Left voice message to return call.

## 2021-01-18 DIAGNOSIS — N2581 Secondary hyperparathyroidism of renal origin: Secondary | ICD-10-CM | POA: Diagnosis not present

## 2021-01-18 DIAGNOSIS — N186 End stage renal disease: Secondary | ICD-10-CM | POA: Diagnosis not present

## 2021-01-18 DIAGNOSIS — Z992 Dependence on renal dialysis: Secondary | ICD-10-CM | POA: Diagnosis not present

## 2021-01-18 DIAGNOSIS — D631 Anemia in chronic kidney disease: Secondary | ICD-10-CM | POA: Diagnosis not present

## 2021-01-18 DIAGNOSIS — D509 Iron deficiency anemia, unspecified: Secondary | ICD-10-CM | POA: Diagnosis not present

## 2021-01-19 ENCOUNTER — Ambulatory Visit (HOSPITAL_BASED_OUTPATIENT_CLINIC_OR_DEPARTMENT_OTHER): Payer: Medicare Other | Admitting: Physical Therapy

## 2021-01-19 ENCOUNTER — Other Ambulatory Visit: Payer: Self-pay | Admitting: Internal Medicine

## 2021-01-20 DIAGNOSIS — N2581 Secondary hyperparathyroidism of renal origin: Secondary | ICD-10-CM | POA: Diagnosis not present

## 2021-01-20 DIAGNOSIS — D509 Iron deficiency anemia, unspecified: Secondary | ICD-10-CM | POA: Diagnosis not present

## 2021-01-20 DIAGNOSIS — Z992 Dependence on renal dialysis: Secondary | ICD-10-CM | POA: Diagnosis not present

## 2021-01-20 DIAGNOSIS — D631 Anemia in chronic kidney disease: Secondary | ICD-10-CM | POA: Diagnosis not present

## 2021-01-20 DIAGNOSIS — N186 End stage renal disease: Secondary | ICD-10-CM | POA: Diagnosis not present

## 2021-01-22 DIAGNOSIS — Z992 Dependence on renal dialysis: Secondary | ICD-10-CM | POA: Diagnosis not present

## 2021-01-22 DIAGNOSIS — T8612 Kidney transplant failure: Secondary | ICD-10-CM | POA: Diagnosis not present

## 2021-01-22 DIAGNOSIS — N186 End stage renal disease: Secondary | ICD-10-CM | POA: Diagnosis not present

## 2021-01-23 DIAGNOSIS — Z992 Dependence on renal dialysis: Secondary | ICD-10-CM | POA: Diagnosis not present

## 2021-01-23 DIAGNOSIS — N186 End stage renal disease: Secondary | ICD-10-CM | POA: Diagnosis not present

## 2021-01-23 DIAGNOSIS — D631 Anemia in chronic kidney disease: Secondary | ICD-10-CM | POA: Diagnosis not present

## 2021-01-23 DIAGNOSIS — N2581 Secondary hyperparathyroidism of renal origin: Secondary | ICD-10-CM | POA: Diagnosis not present

## 2021-01-25 DIAGNOSIS — N186 End stage renal disease: Secondary | ICD-10-CM | POA: Diagnosis not present

## 2021-01-25 DIAGNOSIS — Z992 Dependence on renal dialysis: Secondary | ICD-10-CM | POA: Diagnosis not present

## 2021-01-25 DIAGNOSIS — N2581 Secondary hyperparathyroidism of renal origin: Secondary | ICD-10-CM | POA: Diagnosis not present

## 2021-01-25 DIAGNOSIS — D631 Anemia in chronic kidney disease: Secondary | ICD-10-CM | POA: Diagnosis not present

## 2021-01-26 DIAGNOSIS — M4802 Spinal stenosis, cervical region: Secondary | ICD-10-CM | POA: Diagnosis not present

## 2021-01-26 DIAGNOSIS — Z981 Arthrodesis status: Secondary | ICD-10-CM | POA: Diagnosis not present

## 2021-01-27 DIAGNOSIS — Z992 Dependence on renal dialysis: Secondary | ICD-10-CM | POA: Diagnosis not present

## 2021-01-27 DIAGNOSIS — N186 End stage renal disease: Secondary | ICD-10-CM | POA: Diagnosis not present

## 2021-01-27 DIAGNOSIS — D631 Anemia in chronic kidney disease: Secondary | ICD-10-CM | POA: Diagnosis not present

## 2021-01-27 DIAGNOSIS — N2581 Secondary hyperparathyroidism of renal origin: Secondary | ICD-10-CM | POA: Diagnosis not present

## 2021-01-30 DIAGNOSIS — N2581 Secondary hyperparathyroidism of renal origin: Secondary | ICD-10-CM | POA: Diagnosis not present

## 2021-01-30 DIAGNOSIS — N186 End stage renal disease: Secondary | ICD-10-CM | POA: Diagnosis not present

## 2021-01-30 DIAGNOSIS — D631 Anemia in chronic kidney disease: Secondary | ICD-10-CM | POA: Diagnosis not present

## 2021-01-30 DIAGNOSIS — Z992 Dependence on renal dialysis: Secondary | ICD-10-CM | POA: Diagnosis not present

## 2021-01-31 ENCOUNTER — Ambulatory Visit: Payer: Medicare Other | Admitting: Family

## 2021-02-01 DIAGNOSIS — N186 End stage renal disease: Secondary | ICD-10-CM | POA: Diagnosis not present

## 2021-02-01 DIAGNOSIS — Z992 Dependence on renal dialysis: Secondary | ICD-10-CM | POA: Diagnosis not present

## 2021-02-01 DIAGNOSIS — N2581 Secondary hyperparathyroidism of renal origin: Secondary | ICD-10-CM | POA: Diagnosis not present

## 2021-02-01 DIAGNOSIS — D631 Anemia in chronic kidney disease: Secondary | ICD-10-CM | POA: Diagnosis not present

## 2021-02-03 DIAGNOSIS — D631 Anemia in chronic kidney disease: Secondary | ICD-10-CM | POA: Diagnosis not present

## 2021-02-03 DIAGNOSIS — Z992 Dependence on renal dialysis: Secondary | ICD-10-CM | POA: Diagnosis not present

## 2021-02-03 DIAGNOSIS — N2581 Secondary hyperparathyroidism of renal origin: Secondary | ICD-10-CM | POA: Diagnosis not present

## 2021-02-03 DIAGNOSIS — N186 End stage renal disease: Secondary | ICD-10-CM | POA: Diagnosis not present

## 2021-02-06 DIAGNOSIS — Z992 Dependence on renal dialysis: Secondary | ICD-10-CM | POA: Diagnosis not present

## 2021-02-06 DIAGNOSIS — N186 End stage renal disease: Secondary | ICD-10-CM | POA: Diagnosis not present

## 2021-02-06 DIAGNOSIS — N2581 Secondary hyperparathyroidism of renal origin: Secondary | ICD-10-CM | POA: Diagnosis not present

## 2021-02-06 DIAGNOSIS — D631 Anemia in chronic kidney disease: Secondary | ICD-10-CM | POA: Diagnosis not present

## 2021-02-08 DIAGNOSIS — N186 End stage renal disease: Secondary | ICD-10-CM | POA: Diagnosis not present

## 2021-02-08 DIAGNOSIS — N2581 Secondary hyperparathyroidism of renal origin: Secondary | ICD-10-CM | POA: Diagnosis not present

## 2021-02-08 DIAGNOSIS — Z992 Dependence on renal dialysis: Secondary | ICD-10-CM | POA: Diagnosis not present

## 2021-02-08 DIAGNOSIS — D631 Anemia in chronic kidney disease: Secondary | ICD-10-CM | POA: Diagnosis not present

## 2021-02-09 ENCOUNTER — Encounter: Payer: Self-pay | Admitting: Family

## 2021-02-09 ENCOUNTER — Other Ambulatory Visit: Payer: Self-pay | Admitting: Family

## 2021-02-09 ENCOUNTER — Other Ambulatory Visit: Payer: Self-pay

## 2021-02-09 ENCOUNTER — Ambulatory Visit (INDEPENDENT_AMBULATORY_CARE_PROVIDER_SITE_OTHER): Payer: Medicare Other | Admitting: Family

## 2021-02-09 VITALS — BP 120/60 | HR 62 | Temp 97.5°F | Resp 18 | Ht 67.0 in | Wt 203.2 lb

## 2021-02-09 DIAGNOSIS — R197 Diarrhea, unspecified: Secondary | ICD-10-CM | POA: Diagnosis not present

## 2021-02-09 DIAGNOSIS — K219 Gastro-esophageal reflux disease without esophagitis: Secondary | ICD-10-CM

## 2021-02-09 DIAGNOSIS — I120 Hypertensive chronic kidney disease with stage 5 chronic kidney disease or end stage renal disease: Secondary | ICD-10-CM | POA: Diagnosis not present

## 2021-02-09 DIAGNOSIS — M545 Low back pain, unspecified: Secondary | ICD-10-CM | POA: Diagnosis not present

## 2021-02-09 DIAGNOSIS — R200 Anesthesia of skin: Secondary | ICD-10-CM | POA: Diagnosis not present

## 2021-02-09 DIAGNOSIS — G8929 Other chronic pain: Secondary | ICD-10-CM

## 2021-02-09 DIAGNOSIS — I5022 Chronic systolic (congestive) heart failure: Secondary | ICD-10-CM | POA: Diagnosis not present

## 2021-02-09 DIAGNOSIS — N186 End stage renal disease: Secondary | ICD-10-CM | POA: Diagnosis not present

## 2021-02-09 DIAGNOSIS — R202 Paresthesia of skin: Secondary | ICD-10-CM

## 2021-02-09 DIAGNOSIS — N200 Calculus of kidney: Secondary | ICD-10-CM | POA: Diagnosis not present

## 2021-02-09 MED ORDER — OMEPRAZOLE 20 MG PO CPDR: DELAYED_RELEASE_CAPSULE | ORAL | 3 refills | Status: DC

## 2021-02-09 NOTE — Progress Notes (Signed)
Provider: Marlowe Sax FNP-C  Rielyn Krupinski, Nelda Bucks, NP  Patient Care Team: Danyla Wattley, Nelda Bucks, NP as PCP - General (Family Medicine) [name], MD as PCP - Cardiology (Cardiology) Jovita Kussmaul, MD as Consulting Physician (General Surgery) [name], MD as Consulting Physician (Pulmonary Disease) Estanislado Emms, MD (Inactive) as Consulting Physician (Nephrology) Elam Dutch, MD (Inactive) as Consulting Physician (Vascular Surgery) [name], MD as Consulting Physician (Cardiology) Milus Banister, MD as Attending Physician (Gastroenterology) Jamal Maes, MD as Consulting Physician (Nephrology)  Extended Emergency Contact Information Primary Emergency Contact: [name] Address: [address]          [place], [place] [address] Johnnette Litter of [place] Phone: [phone] Mobile Phone: [phone] Relation: Spouse  Code Status:  Full Code  Goals of care: Advanced Directive information Advanced Directives 02/09/2021  Does Patient Have a Medical Advance Directive? No  Does patient want to make changes to medical advance directive? -  Would patient like information on creating a medical advance directive? No - Patient declined  Pre-existing out of facility DNR order (yellow form or pink MOST form) -     Chief Complaint  Patient presents with   Transitions Of Care    TOC 01/09/2021-01/11/2021.    HPI:  Pt is a 61 y.o. male seen today for an acute visit for Transition of care post hospital admission from 01/09/2021 - 01/11/2021 for nausea,vomiting and abdominal pain.He also had diarrhea x 3 days.Abdominal exam was negative.CT scan did not show any acute findings.Nephrolithiasis was noted but no obstruction was noted.symptoms improved and tolerated his diet. He states feeling much better post hospital admission but feels weak,tired all the time and has shortness of breath with Exertion.He denies any lower extremities edema or weight  gain. Has a medical history of Hypertension,ESRD on hemodialysis three times per week,chronic diastolic CHF,Type 2 DM,Hyperlipidemia,OSA on CPAP,Generalized Anxiety disorder,Anemia of chronic renal disease,Metabolic bone disease,chronic neck and back pain,Obesity,chronic headaches,Memory loss,GERD,Insomnia among others.   He states both legs and hands feel tingling and numbness.sometimes has to readjust how he sits.Legs still give out.   Has had loose stool constantly.wear diaper when sleeping.sometimes when he coughs he just has loose bowel movement.  Has had weight loss 13 lbs since    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)    Anxiety    Asthma    Asthma    moderate persistent   Carpal tunnel syndrome    Cellulitis and abscess of trunk    Cholelithiasis 07/13/2014   Chronic headaches    Debility, unspecified    Dermatophytosis of the body    Dysrhythmia    history of   Edema    End stage renal disease on dialysis ([place])    "MWF; [facility]" (10/21/2014)   Essential hypertension, benign    GERD (gastroesophageal reflux disease)    Gout, unspecified    HTN (hypertension)    Hypertrophy of prostate without urinary obstruction and other lower urinary tract symptoms (LUTS)    Hypotension, unspecified    Impotence of organic origin    Insomnia, unspecified    Kidney replaced by transplant    Localization-related (focal) (partial) epilepsy and epileptic syndromes with complex partial seizures, without mention of intractable epilepsy    12-15-19- Wife states he has  NEVER had a seizure    Lumbago    Memory loss    OSA on CPAP    Other and unspecified hyperlipidemia    controlled /managed per wife    Other chronic nonalcoholic liver disease    Other malaise and fatigue    Other nonspecific abnormal serum enzyme  levels    Pain in joint, lower leg    Pain in joint, upper arm    Pneumonia "several times"   Renal dialysis status(V45.11) 02/05/2010   restarted 01/02/13 ofter renal trransplant failure   Secondary hyperparathyroidism (of renal origin)    Shortness of breath    Sleep apnea    wears cpap    Tension headache    Unspecified constipation    Unspecified essential hypertension    Unspecified hereditary and idiopathic peripheral neuropathy    Unspecified vitamin D deficiency    Past Surgical History:  Procedure Laterality Date   AV FISTULA PLACEMENT Left ?2010   "forearm; at [facility]"   [place]  03/21/2011   CHOLECYSTECTOMY N/A 10/21/2014   Procedure: LAPAROSCOPIC CHOLECYSTECTOMY WITH INTRAOPERATIVE CHOLANGIOGRAM;  Surgeon: Autumn Messing III, MD;  Location: [facility];  Service: General;  Laterality: N/A;   COLONOSCOPY     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   KIDNEY TRANSPLANT  08/17/2011   [place]  10/21/2014   w/IOC   LEFT HEART CATHETERIZATION WITH CORONARY ANGIOGRAM N/A 03/21/2011   Procedure: LEFT HEART CATHETERIZATION WITH CORONARY ANGIOGRAM;  Surgeon: [name], MD;  Location: [facility] CATH LAB;  Service: Cardiovascular;  Laterality: N/A;   NEPHRECTOMY  08/2013   removed transplaned kidney   POLYPECTOMY     POSTERIOR FUSION CERVICAL SPINE  06/25/2012   for spinal stenosis   VASECTOMY  2010    Allergies  Allergen Reactions   Codeine Nausea And Vomiting   Hydrocodone-Acetaminophen Nausea And Vomiting    Outpatient Encounter Medications as of 02/09/2021  Medication Sig   acetaminophen (TYLENOL) 500 MG tablet Take 1 tablet (500 mg total) by mouth every 6 (six) hours as needed for moderate pain.   albuterol (PROVENTIL HFA;VENTOLIN HFA) 108 (90 Base) MCG/ACT inhaler Inhale 2 puffs into the lungs every 6 (six) hours as needed for wheezing or shortness of breath.   amLODipine (NORVASC) 10  MG tablet TAKE 1 TABLET BY MOUTH EVERY DAY   aspirin EC 81 MG tablet Take 81 mg by mouth daily.   b complex-vitamin c-folic acid (NEPHRO-VITE) 0.8 MG TABS tablet TAKE 1 TABLET BY MOUTH EVERY DAY   budesonide-formoterol (SYMBICORT) 160-4.5 MCG/ACT inhaler Inhale 2 puffs into the lungs 2 (two) times daily.   calcitRIOL (ROCALTROL) 0.5 MCG capsule Take 2 capsules (1 mcg total) by mouth every Monday, Wednesday, and Friday with hemodialysis.   chlorhexidine (PERIDEX) 0.12 % solution SMARTSIG:By Mouth   Cholecalciferol (VITAMIN D3) 25 MCG (1000 UT) CAPS Take 4,000 Units by mouth daily.   cinacalcet (SENSIPAR) 90 MG tablet Take 90 mg by mouth every evening.   diphenhydrAMINE (BENADRYL) 25 MG tablet Take 50 mg by mouth at bedtime.   doxazosin (CARDURA) 4 MG tablet Take 4 mg by mouth daily.   DULoxetine (CYMBALTA) 30 MG capsule TAKE 1 CAPSULE BY MOUTH EVERY DAY   famotidine (PEPCID) 20 MG tablet Take 1 tablet (20 mg total) by mouth at bedtime.   guaiFENesin (MUCINEX) 600 MG 12 hr tablet Take 1,200 mg by mouth 2 (  two) times daily.   hydrALAZINE (APRESOLINE) 100 MG tablet TAKE 1 TABLET EVERY MORNINGAND AT BEDTIME.   ipratropium-albuterol (DUONEB) 0.5-2.5 (3) MG/3ML SOLN Take 3 mLs by nebulization every 4 (four) hours as needed (Shortness of breath).   isosorbide mononitrate (IMDUR) 30 MG 24 hr tablet TAKE 1 TABLET DAILY   lidocaine-prilocaine (EMLA) cream Apply 1 application topically See admin instructions. Apply 1 to 2 hours prior to dialysis on Mondays, Wednesdays, and Fridays. Cover with saran wrap.   metoprolol tartrate (LOPRESSOR) 100 MG tablet TAKE 1 TABLET BY MOUTH TWICE A DAY   omeprazole (PRILOSEC) 20 MG capsule TAKE 1 CAPSULE TWICE DAILY BEFORE A MEAL   ondansetron (ZOFRAN ODT) 4 MG disintegrating tablet Take 1 tablet (4 mg total) by mouth every 8 (eight) hours as needed for nausea or vomiting.   pravastatin (PRAVACHOL) 40 MG tablet TAKE 1 TABLET BY MOUTH EVERY DAY IN THE EVENING   PRESCRIPTION  MEDICATION See admin instructions. CPAP- At bedtime and during any time of rest   RENVELA 800 MG tablet Take 800-2,400 mg by mouth See admin instructions. Take 2,400 mg by mouth three times a day with meals and 800 mg with each snack   traMADol (ULTRAM) 50 MG tablet Take 1 tablet (50 mg total) by mouth daily as needed.   traZODone (DESYREL) 50 MG tablet TAKE 0.5-1 TABLETS BY MOUTH AT BEDTIME AS NEEDED FOR SLEEP.   vitamin C (ASCORBIC ACID) 250 MG tablet Take 1 tablet (250 mg total) by mouth daily.   [DISCONTINUED] cloNIDine (CATAPRES) 0.1 MG tablet Take 0.1 mg by mouth at bedtime. (Patient not taking: Reported on 01/09/2021)   [DISCONTINUED] gabapentin (NEURONTIN) 100 MG capsule Take 1 capsule (100 mg total) by mouth 2 (two) times daily. (Patient not taking: Reported on 01/09/2021)   No facility-administered encounter medications on file as of 02/09/2021.    Review of Systems  Constitutional:  Negative for appetite change, chills, fatigue, fever and unexpected weight change.  HENT:  Negative for congestion, dental problem, ear discharge, ear pain, facial swelling, hearing loss, nosebleeds, postnasal drip, rhinorrhea, sinus pressure, sinus pain, sneezing, sore throat, tinnitus and trouble swallowing.   Eyes:  Negative for pain, discharge, redness, itching and visual disturbance.  Respiratory:  Negative for cough, chest tightness and wheezing.        Shortness of breath with exertion   Cardiovascular:  Negative for chest pain, palpitations and leg swelling.  Gastrointestinal:  Positive for diarrhea. Negative for abdominal distention, abdominal pain, blood in stool, constipation, nausea and vomiting.  Endocrine: Negative for cold intolerance, heat intolerance, polydipsia, polyphagia and polyuria.  Genitourinary:  Negative for difficulty urinating, dysuria, flank pain, frequency and urgency.  Musculoskeletal:  Positive for arthralgias and neck pain. Negative for back pain, gait problem, joint  swelling, myalgias and neck stiffness.  Skin:  Negative for color change, pallor and rash.  Neurological:  Positive for numbness and headaches. Negative for dizziness, syncope, speech difficulty, weakness and light-headedness.  Hematological:  Does not bruise/bleed easily.  Psychiatric/Behavioral:  Negative for agitation, behavioral problems, confusion, hallucinations, self-injury, sleep disturbance and suicidal ideas. The patient is not nervous/anxious.    Immunization History  Administered Date(s) Administered   Influenza Whole 11/01/2010   Influenza, Seasonal, Injecte, Preservative Fre 10/29/2012   Influenza-Unspecified 11/21/2011, 11/05/2012, 09/22/2013, 09/22/2017, 11/23/2019, 10/28/2020   Moderna Sars-Covid-2 Vaccination 04/06/2019, 05/07/2019, 03/31/2020   PPD Test 12/26/2012   Pneumococcal Polysaccharide-23 11/01/2010, 10/29/2012   Pneumococcal-Unspecified 11/05/2012   Tdap 01/22/2006   Pertinent  Health Maintenance  Due  Topic Date Due   HEMOGLOBIN A1C  05/04/2021   COLONOSCOPY (Pts 45-43yrs Insurance coverage will need to be confirmed)  12/29/2026   INFLUENZA VACCINE  Completed   FOOT EXAM  Discontinued   OPHTHALMOLOGY EXAM  Discontinued   Fall Risk 01/09/2021 01/10/2021 01/10/2021 01/11/2021 02/09/2021  Falls in the past year? - - - - 1  Was there an injury with Fall? - - - - 0  Was there an injury with Fall? - - - - -  Fall Risk Category Calculator - - - - 1  Fall Risk Category - - - - Low  Patient Fall Risk Level Low fall risk Low fall risk Low fall risk Low fall risk Low fall risk  Patient at Risk for Falls Due to - - - - History of fall(s)  Fall risk Follow up - - - - Falls evaluation completed;Education provided;Falls prevention discussed   Functional Status Survey:    Vitals:   02/09/21 1451  BP: 120/60  Pulse: 62  Resp: 18  Temp: (!) 97.5 F (36.4 C)  SpO2: 95%  Weight: 203 lb 3.2 oz (92.2 kg)  Height: 5\' 7"  (1.702 m)   Body mass index is 31.83  kg/m. Physical Exam Vitals reviewed.  Constitutional:      General: He is not in acute distress.    Appearance: Normal appearance. He is normal weight. He is not ill-appearing or diaphoretic.  HENT:     Head: Normocephalic.     Mouth/Throat:     Mouth: Mucous membranes are moist.     Pharynx: Oropharynx is clear. No oropharyngeal exudate or posterior oropharyngeal erythema.  Eyes:     General: No scleral icterus.       Right eye: No discharge.        Left eye: No discharge.     Extraocular Movements: Extraocular movements intact.     Conjunctiva/sclera: Conjunctivae normal.     Pupils: Pupils are equal, round, and reactive to light.  Neck:     Comments: Neck limited ROM Cardiovascular:     Rate and Rhythm: Normal rate and regular rhythm.     Pulses: Normal pulses.     Heart sounds: Normal heart sounds. No murmur heard.   No friction rub. No gallop.  Pulmonary:     Effort: Pulmonary effort is normal. No respiratory distress.     Breath sounds: Normal breath sounds. No wheezing, rhonchi or rales.  Chest:     Chest wall: No tenderness.  Abdominal:     General: Bowel sounds are normal. There is no distension.     Palpations: Abdomen is soft. There is no mass.     Tenderness: There is no abdominal tenderness. There is no right CVA tenderness, left CVA tenderness, guarding or rebound.  Musculoskeletal:        General: No swelling or tenderness.     Cervical back: No rigidity or tenderness.     Right lower leg: No edema.     Left lower leg: No edema.  Lymphadenopathy:     Cervical: No cervical adenopathy.  Skin:    General: Skin is warm and dry.     Coloration: Skin is not pale.     Findings: No erythema or rash.  Neurological:     Mental Status: He is alert. Mental status is at baseline.     Cranial Nerves: No cranial nerve deficit.     Sensory: No sensory deficit.     Motor: No weakness.  Coordination: Coordination normal.     Gait: Gait abnormal.  Psychiatric:         Mood and Affect: Mood normal.        Speech: Speech normal.        Behavior: Behavior normal.    Labs reviewed: Recent Labs    01/09/21 1410 01/09/21 2251 01/10/21 0345 01/11/21 0345  NA 137  --  137 135  K 3.3*  --  3.6 3.8  CL 96*  --  95* 94*  CO2 27  --  29 30  GLUCOSE 158*  --  132* 115*  BUN 15  --  18 24*  CREATININE 7.84*  --  10.25* 13.05*  CALCIUM 9.4  --  8.9 8.7*  MG  --  2.1  --   --   PHOS  --   --  4.1  --    Recent Labs    05/06/20 1516 01/09/21 1410 01/10/21 0345 01/11/21 0345  AST 34 25  --  20  ALT 30 19  --  18  ALKPHOS 72 97  --  84  BILITOT 0.9 0.7  --  0.8  PROT 7.7 8.1  --  6.6  ALBUMIN 3.9 4.2 3.7 3.4*   Recent Labs    05/06/20 1516 11/03/20 1540 01/09/21 1410 01/10/21 0345 01/11/21 0345  WBC 4.5 2.4* 4.2 4.3 3.6*  NEUTROABS 2.7 1,008* 3.1  --   --   HGB 12.6* 10.2* 12.8* 12.0* 11.6*  HCT 37.5* 30.6* 38.7* 37.2* 35.4*  MCV 104.7* 95.6 99.0 99.2 100.9*  PLT 85* 105* 118* 104* 98*   Lab Results  Component Value Date   TSH 1.38 03/03/2020   Lab Results  Component Value Date   HGBA1C 5.8 (H) 11/03/2020   Lab Results  Component Value Date   CHOL 166 11/03/2020   HDL 28 (L) 11/03/2020   LDLCALC  11/03/2020     Comment:     . LDL cholesterol not calculated. Triglyceride levels greater than 400 mg/dL invalidate calculated LDL results. . Reference range: <100 . Desirable range <100 mg/dL for primary prevention;   <70 mg/dL for patients with CHD or diabetic patients  with > or = 2 CHD risk factors. Marland Kitchen LDL-C is now calculated using the Martin-Hopkins  calculation, which is a validated novel method providing  better accuracy than the Friedewald equation in the  estimation of LDL-C.  Cresenciano Genre et al. Annamaria Helling. 7510;258(52): 2061-2068  (http://education.[employer].com/faq/FAQ164)    TRIG 429 (H) 11/03/2020   CHOLHDL 5.9 (H) 11/03/2020    Significant Diagnostic Results in last 30 days:  No results  found.  Assessment/Plan  1. Diarrhea, unspecified type Unknown etiology.did not eat out  Negative abdomen exam.  - Ambulatory referral to Gastroenterology  2. GERD without esophagitis H/H Symptoms have worsen  Restart Omeprazole - omeprazole (PRILOSEC) 20 MG capsule; TAKE 1 CAPSULE TWICE DAILY BEFORE A MEAL  Dispense: 180 capsule; Refill: 3  3. Benign hypertension with ESRD (end-stage renal disease) ([place]) B/p well controlled  - continue amlodipine ,hydralazine,Imdur,metoprolol and doxazosin   4. Nephrolithiasis Noted on CT scan on recent hospital admission.request urology referral. - Ambulatory referral to Urology  5. Chronic bilateral low back pain without sciatica Continue current pain regimen   6. Numbness and tingling of both lower extremities Off Gabapentin   7. Chronic systolic heart failure (HCC) No signs of fluid overload on HD three times a week   Family/ staff Communication: Reviewed plan of care with patient and wife  verbalized understanding.  Labs/tests ordered: None   Next Appointment: As needed if symptoms worsen or fail to improve    Sandrea Hughs, NP

## 2021-02-10 DIAGNOSIS — Z992 Dependence on renal dialysis: Secondary | ICD-10-CM | POA: Diagnosis not present

## 2021-02-10 DIAGNOSIS — N2581 Secondary hyperparathyroidism of renal origin: Secondary | ICD-10-CM | POA: Diagnosis not present

## 2021-02-10 DIAGNOSIS — N186 End stage renal disease: Secondary | ICD-10-CM | POA: Diagnosis not present

## 2021-02-10 DIAGNOSIS — D631 Anemia in chronic kidney disease: Secondary | ICD-10-CM | POA: Diagnosis not present

## 2021-02-13 DIAGNOSIS — Z992 Dependence on renal dialysis: Secondary | ICD-10-CM | POA: Diagnosis not present

## 2021-02-13 DIAGNOSIS — N186 End stage renal disease: Secondary | ICD-10-CM | POA: Diagnosis not present

## 2021-02-13 DIAGNOSIS — D631 Anemia in chronic kidney disease: Secondary | ICD-10-CM | POA: Diagnosis not present

## 2021-02-13 DIAGNOSIS — N2581 Secondary hyperparathyroidism of renal origin: Secondary | ICD-10-CM | POA: Diagnosis not present

## 2021-02-14 ENCOUNTER — Telehealth: Payer: Self-pay | Admitting: Internal Medicine

## 2021-02-14 NOTE — Telephone Encounter (Signed)
° °  Pre-operative Risk Assessment    Patient Name: Kirk [name]  DOB: [birth year] MRN: [id number]      Request for Surgical Clearance    Procedure:   ACDF C6/7; removal of plate C5/6  Date of Surgery:  Clearance TBD                                 Surgeon:  Gerilyn Pilgrim MD Surgeon's Group or Practice Name:  [facility] number:  [phone] Fax number:  [phone]   Type of Clearance Requested:   - Medical and Pharmacy - hold ASA 81mg    Type of Anesthesia:   not specified    Additional requests/questions:   N/A - requested review/return ASAP  Signed, Fidel Levy   02/14/2021, 4:16 PM

## 2021-02-15 DIAGNOSIS — D631 Anemia in chronic kidney disease: Secondary | ICD-10-CM | POA: Diagnosis not present

## 2021-02-15 DIAGNOSIS — N186 End stage renal disease: Secondary | ICD-10-CM | POA: Diagnosis not present

## 2021-02-15 DIAGNOSIS — Z992 Dependence on renal dialysis: Secondary | ICD-10-CM | POA: Diagnosis not present

## 2021-02-15 DIAGNOSIS — N2581 Secondary hyperparathyroidism of renal origin: Secondary | ICD-10-CM | POA: Diagnosis not present

## 2021-02-15 NOTE — Telephone Encounter (Signed)
VM 1/25

## 2021-02-17 DIAGNOSIS — D631 Anemia in chronic kidney disease: Secondary | ICD-10-CM | POA: Diagnosis not present

## 2021-02-17 DIAGNOSIS — N186 End stage renal disease: Secondary | ICD-10-CM | POA: Diagnosis not present

## 2021-02-17 DIAGNOSIS — N2581 Secondary hyperparathyroidism of renal origin: Secondary | ICD-10-CM | POA: Diagnosis not present

## 2021-02-17 DIAGNOSIS — Z992 Dependence on renal dialysis: Secondary | ICD-10-CM | POA: Diagnosis not present

## 2021-02-20 DIAGNOSIS — D631 Anemia in chronic kidney disease: Secondary | ICD-10-CM | POA: Diagnosis not present

## 2021-02-20 DIAGNOSIS — N2581 Secondary hyperparathyroidism of renal origin: Secondary | ICD-10-CM | POA: Diagnosis not present

## 2021-02-20 DIAGNOSIS — Z992 Dependence on renal dialysis: Secondary | ICD-10-CM | POA: Diagnosis not present

## 2021-02-20 DIAGNOSIS — N186 End stage renal disease: Secondary | ICD-10-CM | POA: Diagnosis not present

## 2021-02-21 NOTE — Telephone Encounter (Signed)
Patient's wife returned call.  

## 2021-02-21 NOTE — Telephone Encounter (Signed)
Attempted to contact as part of preoperative protocol.  Patient needs call back.  02/21/2021 at 7:47 AM

## 2021-02-22 ENCOUNTER — Telehealth: Payer: Self-pay | Admitting: Internal Medicine

## 2021-02-22 DIAGNOSIS — N2581 Secondary hyperparathyroidism of renal origin: Secondary | ICD-10-CM | POA: Diagnosis not present

## 2021-02-22 DIAGNOSIS — D631 Anemia in chronic kidney disease: Secondary | ICD-10-CM | POA: Diagnosis not present

## 2021-02-22 DIAGNOSIS — T8612 Kidney transplant failure: Secondary | ICD-10-CM | POA: Diagnosis not present

## 2021-02-22 DIAGNOSIS — Z992 Dependence on renal dialysis: Secondary | ICD-10-CM | POA: Diagnosis not present

## 2021-02-22 DIAGNOSIS — N186 End stage renal disease: Secondary | ICD-10-CM | POA: Diagnosis not present

## 2021-02-22 NOTE — Telephone Encounter (Signed)
Patient's wife is calling back due to preop not returning her call yesterday in regards to her husband.

## 2021-02-22 NOTE — Telephone Encounter (Signed)
Will forward to pre op to reach out to the pt.

## 2021-02-22 NOTE — Telephone Encounter (Signed)
clearance TBD (Newest Message First) You Just now (3:27 PM)   Will forward to pre op to reach out to the pt.       Note    Trilby Drummer routed conversation to Cv Div Preop Callback 14 minutes ago (3:12 PM)   Trilby Drummer 14 minutes ago (3:12 PM)   [facility] Patient's wife is calling back due to preop not returning her call yesterday in regards to her husband.       Note    [place], [place] [phone]  Trilby Drummer

## 2021-02-23 ENCOUNTER — Telehealth: Payer: Self-pay | Admitting: Internal Medicine

## 2021-02-23 NOTE — Telephone Encounter (Signed)
° °  Name: Kirk [name]  DOB: [birth year]  MRN: [id number]   Primary Cardiologist: [name], MD  Chart reviewed as part of pre-operative protocol coverage. Patient was contacted 02/23/2021 in reference to pre-operative risk assessment for pending surgery as outlined below.  Kirk [name] was last seen on 08/23/2020 by Laurann Montana NP.  Since that day, Kirk [name] has done well without exertional chest pain or worsening dyspnea.  Therefore, based on ACC/AHA guidelines, the patient would be at acceptable risk for the planned procedure without further cardiovascular testing.   Patient may hold aspirin for 7 days prior to the surgery and restart as soon as possible afterward at the surgeon's discretion.  The patient was advised that if he develops new symptoms prior to surgery to contact our office to arrange for a follow-up visit, and he verbalized understanding.  I will route this recommendation to the requesting party via Epic fax function and remove from pre-op pool. Please call with questions.  [place], [place] 02/23/2021, 8:42 AM

## 2021-02-23 NOTE — Telephone Encounter (Signed)
Called Patty and LVM asking her to fax surgery clearance to [facility] office fax [phone] since MW is here this week and I will call or fax the form back to her after its completed. Will route this to [facility] triage

## 2021-02-24 ENCOUNTER — Telehealth: Payer: Self-pay | Admitting: Internal Medicine

## 2021-02-24 DIAGNOSIS — N2581 Secondary hyperparathyroidism of renal origin: Secondary | ICD-10-CM | POA: Diagnosis not present

## 2021-02-24 DIAGNOSIS — Z992 Dependence on renal dialysis: Secondary | ICD-10-CM | POA: Diagnosis not present

## 2021-02-24 DIAGNOSIS — N186 End stage renal disease: Secondary | ICD-10-CM | POA: Diagnosis not present

## 2021-02-24 DIAGNOSIS — D631 Anemia in chronic kidney disease: Secondary | ICD-10-CM | POA: Diagnosis not present

## 2021-02-24 NOTE — Telephone Encounter (Addendum)
Fax received from Dr. Gerilyn Pilgrim with [facility] to perform a ACDF C6/7, removal of plate C5/6 on patient.  Patient needs surgery clearance. Patient was seen on 08/25/2020 and is scheduled with Beth on 03/02/21. Office protocol is a risk assessment can be sent to surgeon if patient has been seen in 60 days or less.   Sending to [facility] for risk assessment

## 2021-02-24 NOTE — Telephone Encounter (Signed)
Per [facility] form has been received but patient has not been seen in the last 60 days. Pt has an appt with Volanda Napoleon NP on 03/02/2021. ATC patient to notify. LMTCB

## 2021-02-25 DIAGNOSIS — Z20822 Contact with and (suspected) exposure to covid-19: Secondary | ICD-10-CM | POA: Diagnosis not present

## 2021-02-27 DIAGNOSIS — D631 Anemia in chronic kidney disease: Secondary | ICD-10-CM | POA: Diagnosis not present

## 2021-02-27 DIAGNOSIS — N2581 Secondary hyperparathyroidism of renal origin: Secondary | ICD-10-CM | POA: Diagnosis not present

## 2021-02-27 DIAGNOSIS — N186 End stage renal disease: Secondary | ICD-10-CM | POA: Diagnosis not present

## 2021-02-27 DIAGNOSIS — Z992 Dependence on renal dialysis: Secondary | ICD-10-CM | POA: Diagnosis not present

## 2021-02-28 DIAGNOSIS — E0800 Diabetes mellitus due to underlying condition with hyperosmolarity without nonketotic hyperglycemic-hyperosmolar coma (NKHHC): Secondary | ICD-10-CM | POA: Diagnosis not present

## 2021-02-28 DIAGNOSIS — I10 Essential (primary) hypertension: Secondary | ICD-10-CM | POA: Diagnosis not present

## 2021-02-28 DIAGNOSIS — I639 Cerebral infarction, unspecified: Secondary | ICD-10-CM | POA: Diagnosis not present

## 2021-02-28 DIAGNOSIS — D649 Anemia, unspecified: Secondary | ICD-10-CM | POA: Diagnosis not present

## 2021-02-28 DIAGNOSIS — Z981 Arthrodesis status: Secondary | ICD-10-CM | POA: Diagnosis not present

## 2021-02-28 DIAGNOSIS — E213 Hyperparathyroidism, unspecified: Secondary | ICD-10-CM | POA: Diagnosis not present

## 2021-02-28 DIAGNOSIS — H918X9 Other specified hearing loss, unspecified ear: Secondary | ICD-10-CM | POA: Diagnosis not present

## 2021-02-28 DIAGNOSIS — F17210 Nicotine dependence, cigarettes, uncomplicated: Secondary | ICD-10-CM | POA: Diagnosis not present

## 2021-02-28 DIAGNOSIS — I120 Hypertensive chronic kidney disease with stage 5 chronic kidney disease or end stage renal disease: Secondary | ICD-10-CM | POA: Diagnosis not present

## 2021-02-28 DIAGNOSIS — N186 End stage renal disease: Secondary | ICD-10-CM | POA: Diagnosis not present

## 2021-02-28 DIAGNOSIS — T8611 Kidney transplant rejection: Secondary | ICD-10-CM | POA: Diagnosis not present

## 2021-02-28 DIAGNOSIS — Z940 Kidney transplant status: Secondary | ICD-10-CM | POA: Diagnosis not present

## 2021-02-28 DIAGNOSIS — E538 Deficiency of other specified B group vitamins: Secondary | ICD-10-CM | POA: Diagnosis not present

## 2021-02-28 DIAGNOSIS — M5412 Radiculopathy, cervical region: Secondary | ICD-10-CM | POA: Diagnosis not present

## 2021-02-28 DIAGNOSIS — I2510 Atherosclerotic heart disease of native coronary artery without angina pectoris: Secondary | ICD-10-CM | POA: Diagnosis not present

## 2021-02-28 DIAGNOSIS — G4733 Obstructive sleep apnea (adult) (pediatric): Secondary | ICD-10-CM | POA: Diagnosis not present

## 2021-02-28 DIAGNOSIS — J4520 Mild intermittent asthma, uncomplicated: Secondary | ICD-10-CM | POA: Diagnosis not present

## 2021-02-28 DIAGNOSIS — K219 Gastro-esophageal reflux disease without esophagitis: Secondary | ICD-10-CM | POA: Diagnosis not present

## 2021-02-28 DIAGNOSIS — E7800 Pure hypercholesterolemia, unspecified: Secondary | ICD-10-CM | POA: Diagnosis not present

## 2021-02-28 DIAGNOSIS — Z905 Acquired absence of kidney: Secondary | ICD-10-CM | POA: Diagnosis not present

## 2021-02-28 DIAGNOSIS — Z01818 Encounter for other preprocedural examination: Secondary | ICD-10-CM | POA: Diagnosis not present

## 2021-02-28 DIAGNOSIS — Z9989 Dependence on other enabling machines and devices: Secondary | ICD-10-CM | POA: Diagnosis not present

## 2021-02-28 DIAGNOSIS — I428 Other cardiomyopathies: Secondary | ICD-10-CM | POA: Diagnosis not present

## 2021-02-28 DIAGNOSIS — I480 Paroxysmal atrial fibrillation: Secondary | ICD-10-CM | POA: Diagnosis not present

## 2021-03-01 DIAGNOSIS — D631 Anemia in chronic kidney disease: Secondary | ICD-10-CM | POA: Diagnosis not present

## 2021-03-01 DIAGNOSIS — N186 End stage renal disease: Secondary | ICD-10-CM | POA: Diagnosis not present

## 2021-03-01 DIAGNOSIS — N2581 Secondary hyperparathyroidism of renal origin: Secondary | ICD-10-CM | POA: Diagnosis not present

## 2021-03-01 DIAGNOSIS — Z992 Dependence on renal dialysis: Secondary | ICD-10-CM | POA: Diagnosis not present

## 2021-03-02 ENCOUNTER — Encounter: Payer: Self-pay | Admitting: Internal Medicine

## 2021-03-02 ENCOUNTER — Ambulatory Visit (INDEPENDENT_AMBULATORY_CARE_PROVIDER_SITE_OTHER): Payer: Medicare Other | Admitting: Internal Medicine

## 2021-03-02 ENCOUNTER — Encounter: Payer: Self-pay | Admitting: Primary Care

## 2021-03-02 ENCOUNTER — Ambulatory Visit (INDEPENDENT_AMBULATORY_CARE_PROVIDER_SITE_OTHER): Payer: Medicare Other | Admitting: Primary Care

## 2021-03-02 ENCOUNTER — Other Ambulatory Visit: Payer: Self-pay

## 2021-03-02 VITALS — BP 128/60 | HR 63 | Temp 97.8°F | Ht 67.0 in | Wt 206.6 lb

## 2021-03-02 VITALS — BP 128/64 | HR 70 | Ht 67.0 in | Wt 206.2 lb

## 2021-03-02 DIAGNOSIS — J4530 Mild persistent asthma, uncomplicated: Secondary | ICD-10-CM | POA: Diagnosis not present

## 2021-03-02 DIAGNOSIS — G4733 Obstructive sleep apnea (adult) (pediatric): Secondary | ICD-10-CM | POA: Diagnosis not present

## 2021-03-02 DIAGNOSIS — N186 End stage renal disease: Secondary | ICD-10-CM | POA: Diagnosis not present

## 2021-03-02 DIAGNOSIS — Z01811 Encounter for preprocedural respiratory examination: Secondary | ICD-10-CM

## 2021-03-02 DIAGNOSIS — I428 Other cardiomyopathies: Secondary | ICD-10-CM

## 2021-03-02 DIAGNOSIS — I10 Essential (primary) hypertension: Secondary | ICD-10-CM

## 2021-03-02 DIAGNOSIS — Z01810 Encounter for preprocedural cardiovascular examination: Secondary | ICD-10-CM

## 2021-03-02 DIAGNOSIS — Z992 Dependence on renal dialysis: Secondary | ICD-10-CM

## 2021-03-02 DIAGNOSIS — Z9989 Dependence on other enabling machines and devices: Secondary | ICD-10-CM | POA: Diagnosis not present
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
## 2021-03-02 NOTE — Patient Instructions (Addendum)
Patient is considered low-intermediate risk for prolonged mech ventilation or post-op pulmonary complications d/t hx obstructive asthma and OSA. He is optimized for surgery from pulmonary standpoint. Ultimate clearance will be decided by surgeon. Recommend surgery be done in hospital setting with anesthesia present. Advised early ambulation and Incentive spirometry use every hour while awake after surgery. Continue CPAP use, bring machine with you day of surgery.   Recommend 1. Short duration of surgery as much as possible and avoid paralytic if possible 2. Recovery in step down or ICU with Pulmonary consultation if needed  3. DVT prophylaxis 4. Aggressive pulmonary toilet with o2, bronchodilatation, and incentive spirometry and early ambulation  ORDERS: In office spirometry   Follow-up: With Dr. Melvyn Novas in August 2023

## 2021-03-02 NOTE — Assessment & Plan Note (Signed)
-   Patient is considered low-intermediate risk for prolonged mech ventilation or post-op pulmonary complications d/t hx obstructive asthma and OSA. He is optimized for surgery from pulmonary standpoint. Ultimate clearance will be decided by surgeon. Recommend surgery be done in hospital setting with anesthesia present. Advised early ambulation and Incentive spirometry use every hour while awake after surgery. Continue CPAP use, bring machine with you day of surgery.   Recommend 1. Short duration of surgery as much as possible and avoid paralytic if possible 2. Recovery in step down or ICU with Pulmonary consultation if needed  3. DVT prophylaxis 4. Aggressive pulmonary toilet with o2, bronchodilatation, and incentive spirometry and early ambulation
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
## 2021-03-02 NOTE — Assessment & Plan Note (Signed)
-   Stable; No recent exacerbations. RARE SABA use.  - Continue Symbicort 147mcg two puffs morning and evening - Spirometry today showed moderate airway obstruction, similar to previous / FEV1 42 %, ratio 70

## 2021-03-02 NOTE — Assessment & Plan Note (Signed)
-   OSA managed by Dr. Brett Fairy. Patient reports compliance with CPAP, no download available in resmed airview. Continue to encourage consistent use. Advised he bring CPAP machine and mask with him day of surgery.

## 2021-03-02 NOTE — Telephone Encounter (Signed)
OV notes and clearance form have been faxed back to [facility]. Nothing further needed at this time.

## 2021-03-02 NOTE — Patient Instructions (Signed)
Medication Instructions:  Your physician recommends that you continue on your current medications as directed. Please refer to the Current Medication list given to you today.  *If you need a refill on your cardiac medications before your next appointment, please call your pharmacy*  Follow-Up: At [facility], you and your health needs are our priority.  As part of our continuing mission to provide you with exceptional heart care, we have created designated Provider Care Teams.  These Care Teams include your primary Cardiologist (physician) and Advanced Practice Providers (APPs -  Physician Assistants and Nurse Practitioners) who all work together to provide you with the care you need, when you need it.  We recommend signing up for the patient portal called "MyChart".  Sign up information is provided on this After Visit Summary.  MyChart is used to connect with patients for Virtual Visits (Telemedicine).  Patients are able to view lab/test results, encounter notes, upcoming appointments, etc.  Non-urgent messages can be sent to your provider as well.   To learn more about what you can do with MyChart, go to [email or web address].    Your next appointment:   1 year with Dr. Debara Pickett

## 2021-03-03 DIAGNOSIS — D631 Anemia in chronic kidney disease: Secondary | ICD-10-CM | POA: Diagnosis not present

## 2021-03-03 DIAGNOSIS — N2581 Secondary hyperparathyroidism of renal origin: Secondary | ICD-10-CM | POA: Diagnosis not present

## 2021-03-03 DIAGNOSIS — N186 End stage renal disease: Secondary | ICD-10-CM | POA: Diagnosis not present

## 2021-03-03 DIAGNOSIS — Z992 Dependence on renal dialysis: Secondary | ICD-10-CM | POA: Diagnosis not present

## 2021-03-04 DIAGNOSIS — N186 End stage renal disease: Secondary | ICD-10-CM | POA: Diagnosis not present

## 2021-03-04 DIAGNOSIS — N2581 Secondary hyperparathyroidism of renal origin: Secondary | ICD-10-CM | POA: Diagnosis not present

## 2021-03-04 DIAGNOSIS — Z992 Dependence on renal dialysis: Secondary | ICD-10-CM | POA: Diagnosis not present

## 2021-03-04 DIAGNOSIS — D631 Anemia in chronic kidney disease: Secondary | ICD-10-CM | POA: Diagnosis not present

## 2021-03-06 DIAGNOSIS — Z720 Tobacco use: Secondary | ICD-10-CM | POA: Diagnosis not present

## 2021-03-06 DIAGNOSIS — M7989 Other specified soft tissue disorders: Secondary | ICD-10-CM | POA: Diagnosis not present

## 2021-03-06 DIAGNOSIS — M48061 Spinal stenosis, lumbar region without neurogenic claudication: Secondary | ICD-10-CM | POA: Diagnosis not present

## 2021-03-06 DIAGNOSIS — Z7982 Long term (current) use of aspirin: Secondary | ICD-10-CM | POA: Diagnosis not present

## 2021-03-06 DIAGNOSIS — E7800 Pure hypercholesterolemia, unspecified: Secondary | ICD-10-CM | POA: Diagnosis not present

## 2021-03-06 DIAGNOSIS — M4802 Spinal stenosis, cervical region: Secondary | ICD-10-CM | POA: Diagnosis not present

## 2021-03-06 DIAGNOSIS — N2581 Secondary hyperparathyroidism of renal origin: Secondary | ICD-10-CM | POA: Diagnosis not present

## 2021-03-06 DIAGNOSIS — I428 Other cardiomyopathies: Secondary | ICD-10-CM | POA: Diagnosis not present

## 2021-03-06 DIAGNOSIS — I2510 Atherosclerotic heart disease of native coronary artery without angina pectoris: Secondary | ICD-10-CM | POA: Diagnosis not present

## 2021-03-06 DIAGNOSIS — H9190 Unspecified hearing loss, unspecified ear: Secondary | ICD-10-CM | POA: Diagnosis not present

## 2021-03-06 DIAGNOSIS — Z20822 Contact with and (suspected) exposure to covid-19: Secondary | ICD-10-CM | POA: Diagnosis not present

## 2021-03-06 DIAGNOSIS — Z7951 Long term (current) use of inhaled steroids: Secondary | ICD-10-CM | POA: Diagnosis not present

## 2021-03-06 DIAGNOSIS — M5412 Radiculopathy, cervical region: Secondary | ICD-10-CM | POA: Diagnosis not present

## 2021-03-06 DIAGNOSIS — G992 Myelopathy in diseases classified elsewhere: Secondary | ICD-10-CM | POA: Diagnosis not present

## 2021-03-06 DIAGNOSIS — Z905 Acquired absence of kidney: Secondary | ICD-10-CM | POA: Diagnosis not present

## 2021-03-06 DIAGNOSIS — Z981 Arthrodesis status: Secondary | ICD-10-CM | POA: Diagnosis not present

## 2021-03-06 DIAGNOSIS — I252 Old myocardial infarction: Secondary | ICD-10-CM | POA: Diagnosis not present

## 2021-03-06 DIAGNOSIS — Z8673 Personal history of transient ischemic attack (TIA), and cerebral infarction without residual deficits: Secondary | ICD-10-CM | POA: Diagnosis not present

## 2021-03-06 DIAGNOSIS — E1122 Type 2 diabetes mellitus with diabetic chronic kidney disease: Secondary | ICD-10-CM | POA: Diagnosis not present

## 2021-03-06 DIAGNOSIS — Z992 Dependence on renal dialysis: Secondary | ICD-10-CM | POA: Diagnosis not present

## 2021-03-06 DIAGNOSIS — D649 Anemia, unspecified: Secondary | ICD-10-CM | POA: Diagnosis not present

## 2021-03-06 DIAGNOSIS — I120 Hypertensive chronic kidney disease with stage 5 chronic kidney disease or end stage renal disease: Secondary | ICD-10-CM | POA: Diagnosis not present

## 2021-03-06 DIAGNOSIS — J4520 Mild intermittent asthma, uncomplicated: Secondary | ICD-10-CM | POA: Diagnosis not present

## 2021-03-06 DIAGNOSIS — N186 End stage renal disease: Secondary | ICD-10-CM | POA: Diagnosis not present

## 2021-03-07 DIAGNOSIS — Z981 Arthrodesis status: Secondary | ICD-10-CM | POA: Diagnosis not present

## 2021-03-08 DIAGNOSIS — Z992 Dependence on renal dialysis: Secondary | ICD-10-CM | POA: Diagnosis not present

## 2021-03-08 DIAGNOSIS — N2581 Secondary hyperparathyroidism of renal origin: Secondary | ICD-10-CM | POA: Diagnosis not present

## 2021-03-08 DIAGNOSIS — N186 End stage renal disease: Secondary | ICD-10-CM | POA: Diagnosis not present

## 2021-03-08 DIAGNOSIS — D631 Anemia in chronic kidney disease: Secondary | ICD-10-CM | POA: Diagnosis not present

## 2021-03-10 DIAGNOSIS — N186 End stage renal disease: Secondary | ICD-10-CM | POA: Diagnosis not present

## 2021-03-10 DIAGNOSIS — N2581 Secondary hyperparathyroidism of renal origin: Secondary | ICD-10-CM | POA: Diagnosis not present

## 2021-03-10 DIAGNOSIS — Z992 Dependence on renal dialysis: Secondary | ICD-10-CM | POA: Diagnosis not present

## 2021-03-10 DIAGNOSIS — D631 Anemia in chronic kidney disease: Secondary | ICD-10-CM | POA: Diagnosis not present

## 2021-03-13 DIAGNOSIS — D631 Anemia in chronic kidney disease: Secondary | ICD-10-CM | POA: Diagnosis not present

## 2021-03-13 DIAGNOSIS — N2581 Secondary hyperparathyroidism of renal origin: Secondary | ICD-10-CM | POA: Diagnosis not present

## 2021-03-13 DIAGNOSIS — N186 End stage renal disease: Secondary | ICD-10-CM | POA: Diagnosis not present

## 2021-03-13 DIAGNOSIS — Z992 Dependence on renal dialysis: Secondary | ICD-10-CM | POA: Diagnosis not present

## 2021-03-15 DIAGNOSIS — N2581 Secondary hyperparathyroidism of renal origin: Secondary | ICD-10-CM | POA: Diagnosis not present

## 2021-03-15 DIAGNOSIS — N186 End stage renal disease: Secondary | ICD-10-CM | POA: Diagnosis not present

## 2021-03-15 DIAGNOSIS — Z992 Dependence on renal dialysis: Secondary | ICD-10-CM | POA: Diagnosis not present

## 2021-03-15 DIAGNOSIS — D631 Anemia in chronic kidney disease: Secondary | ICD-10-CM | POA: Diagnosis not present

## 2021-03-17 DIAGNOSIS — N186 End stage renal disease: Secondary | ICD-10-CM | POA: Diagnosis not present

## 2021-03-17 DIAGNOSIS — N2581 Secondary hyperparathyroidism of renal origin: Secondary | ICD-10-CM | POA: Diagnosis not present

## 2021-03-17 DIAGNOSIS — D631 Anemia in chronic kidney disease: Secondary | ICD-10-CM | POA: Diagnosis not present

## 2021-03-17 DIAGNOSIS — Z992 Dependence on renal dialysis: Secondary | ICD-10-CM | POA: Diagnosis not present

## 2021-03-21 ENCOUNTER — Encounter (HOSPITAL_COMMUNITY): Payer: Self-pay | Admitting: Emergency Medicine

## 2021-03-21 ENCOUNTER — Emergency Department (HOSPITAL_COMMUNITY)
Admission: EM | Admit: 2021-03-21 | Discharge: 2021-03-22 | Disposition: A | Discharge status: Home / Self Care | Payer: Medicare Other | Attending: Emergency Medicine | Admitting: Emergency Medicine

## 2021-03-21 ENCOUNTER — Ambulatory Visit: Payer: Medicare Other | Admitting: Family

## 2021-03-21 DIAGNOSIS — Z992 Dependence on renal dialysis: Secondary | ICD-10-CM | POA: Diagnosis not present

## 2021-03-21 DIAGNOSIS — N250 Renal osteodystrophy: Secondary | ICD-10-CM | POA: Diagnosis not present

## 2021-03-21 DIAGNOSIS — R0602 Shortness of breath: Secondary | ICD-10-CM | POA: Insufficient documentation

## 2021-03-21 DIAGNOSIS — N186 End stage renal disease: Secondary | ICD-10-CM | POA: Diagnosis not present

## 2021-03-21 DIAGNOSIS — I509 Heart failure, unspecified: Secondary | ICD-10-CM | POA: Diagnosis not present

## 2021-03-21 DIAGNOSIS — D631 Anemia in chronic kidney disease: Secondary | ICD-10-CM | POA: Diagnosis not present

## 2021-03-21 DIAGNOSIS — I132 Hypertensive heart and chronic kidney disease with heart failure and with stage 5 chronic kidney disease, or end stage renal disease: Secondary | ICD-10-CM | POA: Diagnosis not present

## 2021-03-21 DIAGNOSIS — E1129 Type 2 diabetes mellitus with other diabetic kidney complication: Secondary | ICD-10-CM | POA: Diagnosis not present

## 2021-03-21 LAB — CBC WITH DIFFERENTIAL/PLATELET
Abs Immature Granulocytes: 0.01 10*3/uL (ref 0.00–0.07)
Basophils Absolute: 0 10*3/uL (ref 0.0–0.1)
Basophils Relative: 1 %
Eosinophils Absolute: 0.1 10*3/uL (ref 0.0–0.5)
Eosinophils Relative: 3 %
HCT: 26.3 % — ABNORMAL LOW (ref 39.0–52.0)
Hemoglobin: 8.3 g/dL — ABNORMAL LOW (ref 13.0–17.0)
Immature Granulocytes: 0 %
Lymphocytes Relative: 25 %
Lymphs Abs: 0.6 10*3/uL — ABNORMAL LOW (ref 0.7–4.0)
MCH: 31.1 pg (ref 26.0–34.0)
MCHC: 31.6 g/dL (ref 30.0–36.0)
MCV: 98.5 fL (ref 80.0–100.0)
Monocytes Absolute: 0.3 10*3/uL (ref 0.1–1.0)
Monocytes Relative: 11 %
Neutro Abs: 1.5 10*3/uL — ABNORMAL LOW (ref 1.7–7.7)
Neutrophils Relative %: 60 %
Platelets: 73 10*3/uL — ABNORMAL LOW (ref 150–400)
RBC: 2.67 MIL/uL — ABNORMAL LOW (ref 4.22–5.81)
RDW: 14.6 % (ref 11.5–15.5)
Smear Review: DECREASED
WBC: 2.5 10*3/uL — ABNORMAL LOW (ref 4.0–10.5)
nRBC: 0 % (ref 0.0–0.2)

## 2021-03-21 LAB — COMPREHENSIVE METABOLIC PANEL
ALT: 7 U/L (ref 0–44)
AST: 12 U/L — ABNORMAL LOW (ref 15–41)
Albumin: 3.3 g/dL — ABNORMAL LOW (ref 3.5–5.0)
Alkaline Phosphatase: 55 U/L (ref 38–126)
Anion gap: 14 (ref 5–15)
BUN: 62 mg/dL — ABNORMAL HIGH (ref 6–20)
CO2: 21 mmol/L — ABNORMAL LOW (ref 22–32)
Calcium: 9.2 mg/dL (ref 8.9–10.3)
Chloride: 103 mmol/L (ref 98–111)
Creatinine, Ser: 15.97 mg/dL — ABNORMAL HIGH (ref 0.61–1.24)
GFR, Estimated: 3 mL/min — ABNORMAL LOW (ref >60)
Glucose, Bld: 102 mg/dL — ABNORMAL HIGH (ref 70–99)
Potassium: 4.5 mmol/L (ref 3.5–5.1)
Sodium: 138 mmol/L (ref 135–145)
Total Bilirubin: 0.9 mg/dL (ref 0.3–1.2)
Total Protein: 6.6 g/dL (ref 6.5–8.1)

## 2021-03-21 LAB — HEPATITIS B SURFACE ANTIGEN: Hepatitis B Surface Ag: NONREACTIVE

## 2021-03-21 LAB — HEPATITIS B SURFACE ANTIBODY,QUALITATIVE: Hep B S Ab: REACTIVE — AB

## 2021-03-21 MED ORDER — ALTEPLASE 2 MG IJ SOLR: 2.0000 mg | Freq: Once | INTRAMUSCULAR | Status: DC | PRN

## 2021-03-21 MED ORDER — SODIUM CHLORIDE 0.9 % IV SOLN: 100.0000 mL | INTRAVENOUS | Status: DC | PRN

## 2021-03-21 MED ORDER — TRAMADOL HCL 50 MG PO TABS
50.0000 mg | ORAL_TABLET | Freq: Once | ORAL | Status: AC
  Administered 2021-03-21: 50 mg via ORAL
  Filled 2021-03-21: qty 1

## 2021-03-21 MED ORDER — PENTAFLUOROPROP-TETRAFLUOROETH EX AERO
1.0000 "application " | INHALATION_SPRAY | CUTANEOUS | Status: DC | PRN
  Filled 2021-03-21: qty 116

## 2021-03-21 MED ORDER — LIDOCAINE-PRILOCAINE 2.5-2.5 % EX CREA
1.0000 "application " | TOPICAL_CREAM | CUTANEOUS | Status: DC | PRN
  Filled 2021-03-21: qty 5

## 2021-03-21 MED ORDER — HEPARIN SODIUM (PORCINE) 1000 UNIT/ML DIALYSIS
1000.0000 [IU] | INTRAMUSCULAR | Status: DC | PRN
  Filled 2021-03-21: qty 1

## 2021-03-21 MED ORDER — CHLORHEXIDINE GLUCONATE CLOTH 2 % EX PADS: 6.0000 | MEDICATED_PAD | Freq: Every day | CUTANEOUS | Status: DC

## 2021-03-21 MED ORDER — LIDOCAINE HCL (PF) 1 % IJ SOLN: 5.0000 mL | INTRAMUSCULAR | Status: DC | PRN

## 2021-03-21 NOTE — Consult Note (Signed)
Reason for Consult: To manage dialysis and dialysis related needs Referring Physician: Dr Lurline Idol is an 61 y.o. male.  HPI: Pt is a 71M with a PMH sig for ESRD on HD, alport's syndrome, HTN, CHF, DM II, hyperlipidemia, OHS/OSA, pancytopenia who is now seen in consultation at the request of Dr. Alvino Chapel for eval and recs re: management of ESRD and provision of HD.    Pt is normally MWF schedule.  Yesterday there was a water problem at his center- patients were called to come back in to treat--> pt and wife report communication was unclear and didn't know exactly when to come in.  Presents now with SOB and feeling uncomfortable.  Thinks he has at least 5.1 kg on.  Asked to be rescheduled to today as an OP but was told there was no room.  In this setting we are asked to see.  Pt's last rx was Friday.  He completed his full rx (as he normally does) and got to his EDW.  He reports some abd distention and the SOB as previously mentioned.  No O2 requirement.  Dialyzes at [facility] 4 hr 15 min EDW 90.5 kg AVF BFR 450 mL/ min DFR 500 mL/ min 2K/ 3 Ca bath F180 UF profile 4 Heparin 2500 u  Mircera 100 mcg q 2 weeks Calcitriol 1.0 mcg q treatment   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)    Anxiety    Asthma    Asthma    moderate persistent   Carpal tunnel syndrome    Cellulitis and abscess of trunk    Cholelithiasis 07/13/2014   Chronic headaches    Debility, unspecified    Dermatophytosis of the body    Dysrhythmia    history of   Edema    End stage renal disease on dialysis ([place])    "MWF; [facility] in [place]" (10/21/2014)   Essential hypertension, benign    GERD (gastroesophageal reflux disease)    Gout, unspecified    HTN (hypertension)    Hypertrophy of prostate without urinary obstruction and other lower  urinary tract symptoms (LUTS)    Hypotension, unspecified    Impotence of organic origin    Insomnia, unspecified    Kidney replaced by transplant    Localization-related (focal) (partial) epilepsy and epileptic syndromes with complex partial seizures, without mention of intractable epilepsy    12-15-19- Wife states he has NEVER had a seizure    Lumbago    Memory loss    OSA on CPAP    Other and unspecified hyperlipidemia    controlled /managed per wife    Other chronic nonalcoholic liver disease    Other malaise and fatigue    Other nonspecific abnormal serum enzyme levels    Pain in joint, lower leg    Pain in joint, upper arm    Pneumonia "several times"   Renal dialysis status(V45.11) 02/05/2010   restarted 01/02/13 ofter renal trransplant failure   Secondary hyperparathyroidism (of renal origin)    Shortness of breath    Sleep apnea    wears cpap    Tension headache    Unspecified constipation    Unspecified essential hypertension    Unspecified hereditary and idiopathic peripheral neuropathy    Unspecified vitamin D deficiency     Past Surgical  History:  Procedure Laterality Date   AV FISTULA PLACEMENT Left ?2010   "forearm; at [place]"   [place]  03/21/2011   CHOLECYSTECTOMY N/A 10/21/2014   Procedure: LAPAROSCOPIC CHOLECYSTECTOMY WITH INTRAOPERATIVE CHOLANGIOGRAM;  Surgeon: Autumn Messing III, MD;  Location: [place];  Service: General;  Laterality: N/A;   COLONOSCOPY     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   KIDNEY TRANSPLANT  08/17/2011   [place]  10/21/2014   w/IOC   LEFT HEART CATHETERIZATION WITH CORONARY ANGIOGRAM N/A 03/21/2011   Procedure: LEFT HEART CATHETERIZATION WITH CORONARY ANGIOGRAM;  Surgeon: [name], MD;  Location: [facility] CATH LAB;  Service: Cardiovascular;  Laterality: N/A;   NEPHRECTOMY  08/2013   removed transplaned kidney   POLYPECTOMY      POSTERIOR FUSION CERVICAL SPINE  06/25/2012   for spinal stenosis   VASECTOMY  2010    Family History  Adopted: Yes  Problem Relation Age of Onset   Colon cancer Neg Hx    Esophageal cancer Neg Hx    Rectal cancer Neg Hx    Stomach cancer Neg Hx    Colon polyps Neg Hx     Social History:  reports that he quit smoking about 12 months ago. His smoking use included cigarettes. He started smoking about 30 years ago. He has a 16.00 pack-year smoking history. He has never used smokeless tobacco. He reports that he does not drink alcohol and does not use drugs.  Allergies:  Allergies  Allergen Reactions   Codeine Nausea And Vomiting   Hydrocodone-Acetaminophen Nausea And Vomiting    Medications: I have reviewed the patient's current medications.   No results found.  ROS: all other systems reviewed and are negative except as per HPI Blood pressure (!) 146/72, pulse (!) 58, temperature 98.5 F (36.9 C), temperature source Oral, resp. rate 18, SpO2 99 %. GEN NAD, lying in bed HEENT EOMI PERRL  NECK no overt JVD PULM clear CV RRR ABD soft, mildly distended EXT trace LE edema NEURO AAO x 3 nonfocal ACCESS:  LUE AVF   Assessment/Plan: 1 SOB/ abd fullness- secondary to missed dialysis.  Will place orders for a treatment here, then can resume regular schedule as OP 2 ESRD: MWF- missed Monday d/t water problem.  Normally fully adherent to rx 3 Hypertension/ volume- UF as tolerated 4. Anemia of ESRD: follow trends 5. Metabolic Bone Disease: calcitriol with rx 6.  Nutrition: renal diet and binders 7.  Dispo: May be able to discharge after dialysis if feeling improved  Madelon Lips 03/21/2021, 2:24 PM

## 2021-03-21 NOTE — ED Provider Notes (Signed)
[facility] EMERGENCY DEPARTMENT Provider Note   CSN: [id number] Arrival date & time: 03/21/21  4401     History  Chief Complaint  Patient presents with   Missed Dialysis    Kirk [name] is a 61 y.o. male.  HPI Patient presents stating he needs dialysis.  He is a Monday Wednesday Friday dialysis patient today is Tuesday.  States he missed yesterday because there was a problem with the machine.  States he was unable to get done yesterday.  States it was post to call him back and never did.  Feels more short of breath today.  States he feels if he needs it.  No chest pain.  No abdominal pain.    Home Medications Prior to Admission medications   Medication Sig Start Date End Date Taking? Authorizing Provider  acetaminophen (TYLENOL) 500 MG tablet Take 1 tablet (500 mg total) by mouth every 6 (six) hours as needed for moderate pain. 09/12/14   Samuella Cota, MD  albuterol (PROVENTIL HFA;VENTOLIN HFA) 108 (90 Base) MCG/ACT inhaler Inhale 2 puffs into the lungs every 6 (six) hours as needed for wheezing or shortness of breath. 06/05/17   Patrecia Pour, MD  amLODipine (NORVASC) 10 MG tablet TAKE 1 TABLET BY MOUTH EVERY DAY 01/20/21   Hilty, Nadean Corwin, MD  aspirin EC 81 MG tablet Take 81 mg by mouth daily.    [provider]  b complex-vitamin c-folic acid (NEPHRO-VITE) 0.8 MG TABS tablet TAKE 1 TABLET BY MOUTH EVERY DAY 02/08/20   Reed, Tiffany L, DO  budesonide-formoterol (SYMBICORT) 160-4.5 MCG/ACT inhaler Inhale 2 puffs into the lungs 2 (two) times daily. 10/09/18   Tanda Rockers, MD  buPROPion (WELLBUTRIN XL) 150 MG 24 hr tablet Take by mouth as needed. 02/25/20   [provider]  calcitRIOL (ROCALTROL) 0.5 MCG capsule Take 2 capsules (1 mcg total) by mouth every Monday, Wednesday, and Friday with hemodialysis. 06/04/18   Radene Gunning, NP  chlorhexidine (PERIDEX) 0.12 % solution SMARTSIG:By Mouth 03/04/20   [provider]   Cholecalciferol (VITAMIN D3) 25 MCG (1000 UT) CAPS Take 4,000 Units by mouth daily.    [provider]  cinacalcet (SENSIPAR) 90 MG tablet Take 90 mg by mouth every evening.    [provider]  diphenhydrAMINE (BENADRYL) 25 MG tablet Take 50 mg by mouth at bedtime.    [provider]  doxazosin (CARDURA) 4 MG tablet Take 4 mg by mouth daily.    [provider]  DULoxetine (CYMBALTA) 30 MG capsule TAKE 1 CAPSULE BY MOUTH EVERY DAY 09/23/20   Lauree Chandler, NP  famotidine (PEPCID) 20 MG tablet Take 1 tablet (20 mg total) by mouth at bedtime. 06/23/19   Milus Banister, MD  folic acid in sodium chloride 0.9 % 50 mL Inject into the vein. And B-Complex.    [provider]  guaiFENesin (MUCINEX) 600 MG 12 hr tablet Take 1,200 mg by mouth 2 (two) times daily.    [provider]  hydrALAZINE (APRESOLINE) 100 MG tablet TAKE 1 TABLET EVERY MORNINGAND AT BEDTIME. 08/12/20   Hilty, Nadean Corwin, MD  ipratropium-albuterol (DUONEB) 0.5-2.5 (3) MG/3ML SOLN Take 3 mLs by nebulization every 4 (four) hours as needed (Shortness of breath). 12/05/18   Mariel Aloe, MD  isosorbide mononitrate (IMDUR) 30 MG 24 hr tablet TAKE 1 TABLET DAILY 11/10/19   Hilty, Nadean Corwin, MD  lidocaine-prilocaine (EMLA) cream Apply 1 application topically See admin instructions.  Apply 1 to 2 hours prior to dialysis on Mondays, Wednesdays, and Fridays. Cover with saran wrap. 11/18/18   [provider]  metoprolol tartrate (LOPRESSOR) 100 MG tablet TAKE 1 TABLET BY MOUTH TWICE A DAY 09/21/20   Hilty, Nadean Corwin, MD  omeprazole (PRILOSEC) 20 MG capsule TAKE 1 CAPSULE TWICE DAILY BEFORE A MEAL 02/09/21   Ngetich, Dinah C, NP  ondansetron (ZOFRAN ODT) 4 MG disintegrating tablet Take 1 tablet (4 mg total) by mouth every 8 (eight) hours as needed for nausea or vomiting. 01/11/21   Bonnielee Haff, MD  pravastatin (PRAVACHOL) 40 MG tablet TAKE 1 TABLET BY MOUTH EVERY DAY IN THE EVENING  07/26/20   Ngetich, Nelda Bucks, NP  PRESCRIPTION MEDICATION See admin instructions. CPAP- At bedtime and during any time of rest    [provider]  RENVELA 800 MG tablet Take 800-2,400 mg by mouth See admin instructions. Take 2,400 mg by mouth three times a day with meals and 800 mg with each snack 07/06/19   [provider]  traMADol (ULTRAM) 50 MG tablet Take 1 tablet (50 mg total) by mouth daily as needed. 08/25/20   Ngetich, Dinah C, NP  traZODone (DESYREL) 50 MG tablet TAKE 0.5-1 TABLETS BY MOUTH AT BEDTIME AS NEEDED FOR SLEEP. 11/29/20   Ngetich, Dinah C, NP  vitamin C (ASCORBIC ACID) 250 MG tablet Take 1 tablet (250 mg total) by mouth daily. 11/07/20   Ngetich, Dinah C, NP      Allergies    Codeine and Hydrocodone-acetaminophen    Review of Systems   Review of Systems  Respiratory:  Positive for shortness of breath.    Physical Exam Updated Vital Signs BP 138/67 (BP Location: Right Arm)    Pulse 65    Temp 98.5 F (36.9 C) (Oral)    Resp 16    SpO2 97%  Physical Exam Vitals and nursing note reviewed.  Cardiovascular:     Rate and Rhythm: Regular rhythm.  Pulmonary:     Breath sounds: No wheezing or rhonchi.     Comments: Patient is dyspneic and mildly tachypneic after walking in the room.  No focal rales. Musculoskeletal:        General: No tenderness.  Skin:    General: Skin is warm.     Capillary Refill: Capillary refill takes less than 2 seconds.  Neurological:     Mental Status: He is oriented to person, place, and time.    ED Results / Procedures / Treatments   Labs (all labs ordered are listed, but only abnormal results are displayed) Labs Reviewed  COMPREHENSIVE METABOLIC PANEL - Abnormal; Notable for the following components:      Result Value   CO2 21 (*)    Glucose, Bld 102 (*)    BUN 62 (*)    Creatinine, Ser 15.97 (*)    Albumin 3.3 (*)    AST 12 (*)    GFR, Estimated 3 (*)    All other components within normal limits  CBC WITH  DIFFERENTIAL/PLATELET - Abnormal; Notable for the following components:   WBC 2.5 (*)    RBC 2.67 (*)    Hemoglobin 8.3 (*)    HCT 26.3 (*)    Platelets 73 (*)    Neutro Abs 1.5 (*)    Lymphs Abs 0.6 (*)    All other components within normal limits    EKG None  Radiology No results found.  Procedures Procedures    Medications Ordered in ED  Medications - No data to display  ED Course/ Medical Decision Making/ A&P                           Medical Decision Making Amount and/or Complexity of Data Reviewed Labs: ordered.   Patient with shortness of breath after missed dialysis, although being the weekend this was already his longest period between dialysis.  More short of breath.  Reassuring potassium.  Lungs do not show rales, however was dyspneic with just ambulation.  Discussed with Dr. Duayne Cal, from [facility], who will see patient.        Final Clinical Impression(s) / ED Diagnoses Final diagnoses:  End stage renal disease on dialysis [facility])    Rx / DC Orders ED Discharge Orders     None         Davonna Belling, MD 03/21/21 1037

## 2021-03-21 NOTE — ED Triage Notes (Signed)
Patient here requesting dialysis. Patient on MWF schedule, last dialysis on Friday. Missed yesterday's appointment due to reported issue with equipment. Patient alert, oriented, ambulatory, and in no apparent distress at this time.

## 2021-03-21 NOTE — ED Notes (Signed)
HD consent obtained by this RN, signed by pt. Consent at bedside.

## 2021-03-21 NOTE — ED Notes (Signed)
This RN spoke with [name] at dialysis - said that pt is on second shift schedule for dialysis so it will be after 11pm tonight

## 2021-03-22 ENCOUNTER — Encounter: Payer: Self-pay | Admitting: Family

## 2021-03-22 DIAGNOSIS — Z992 Dependence on renal dialysis: Secondary | ICD-10-CM | POA: Diagnosis not present

## 2021-03-22 DIAGNOSIS — N186 End stage renal disease: Secondary | ICD-10-CM | POA: Diagnosis not present

## 2021-03-22 DIAGNOSIS — T8612 Kidney transplant failure: Secondary | ICD-10-CM | POA: Diagnosis not present

## 2021-03-22 LAB — HEPATITIS B SURFACE ANTIBODY, QUANTITATIVE: Hep B S AB Quant (Post): 11.9 m[IU]/mL (ref >9.9)

## 2021-03-22 NOTE — ED Notes (Signed)
Pt to dialysis.

## 2021-03-22 NOTE — ED Provider Notes (Signed)
I assumed care of this patient.  Please see previous provider note for further details of Hx, PE.  Briefly patient is a 61 y.o. male who presented with dyspnea after missing HD on Monday. Went to HD here. ? ?Returned to ED after HD. ?HDS ? ?The patient appears reasonably screened and/or stabilized for discharge and I doubt any other medical condition or other [facility] requiring further screening, evaluation, or treatment in the ED at this time prior to discharge. Safe for discharge with strict return precautions. ? ?Disposition: Discharge ? ?Condition: Good ? ?I have discussed the results, Dx and Tx plan with the patient/family who expressed understanding and agree(s) with the plan. Discharge instructions discussed at length. The patient/family was given strict return precautions who verbalized understanding of the instructions. No further questions at time of discharge.  ? ? ?ED Discharge Orders   ? ? None  ? ?  ? ? ? ?Follow Up: ?Ngetich, Nelda Bucks, NP ?[address] ?[place] [address] ?[phone] ? ?Call  ?as needed ? ? ?  ? ? ? ?  ?Fatima Blank, MD ?03/22/21 [phone] ? ?

## 2021-03-22 NOTE — Discharge Instructions (Signed)
Go to your regular dialysis session on Friday. ?

## 2021-03-23 ENCOUNTER — Encounter: Payer: Self-pay | Admitting: Family

## 2021-03-23 ENCOUNTER — Encounter: Payer: Medicare Other | Admitting: Family

## 2021-03-24 DIAGNOSIS — D509 Iron deficiency anemia, unspecified: Secondary | ICD-10-CM | POA: Diagnosis not present

## 2021-03-24 DIAGNOSIS — N186 End stage renal disease: Secondary | ICD-10-CM | POA: Diagnosis not present

## 2021-03-24 DIAGNOSIS — D631 Anemia in chronic kidney disease: Secondary | ICD-10-CM | POA: Diagnosis not present

## 2021-03-24 DIAGNOSIS — Z992 Dependence on renal dialysis: Secondary | ICD-10-CM | POA: Diagnosis not present

## 2021-03-24 DIAGNOSIS — N2581 Secondary hyperparathyroidism of renal origin: Secondary | ICD-10-CM | POA: Diagnosis not present

## 2021-03-26 DIAGNOSIS — Z20822 Contact with and (suspected) exposure to covid-19: Secondary | ICD-10-CM | POA: Diagnosis not present

## 2021-03-26 NOTE — Progress Notes (Signed)
  This encounter was created in error - please disregard. No show 

## 2021-03-27 DIAGNOSIS — N2581 Secondary hyperparathyroidism of renal origin: Secondary | ICD-10-CM | POA: Diagnosis not present

## 2021-03-27 DIAGNOSIS — D631 Anemia in chronic kidney disease: Secondary | ICD-10-CM | POA: Diagnosis not present

## 2021-03-27 DIAGNOSIS — N186 End stage renal disease: Secondary | ICD-10-CM | POA: Diagnosis not present

## 2021-03-27 DIAGNOSIS — Z992 Dependence on renal dialysis: Secondary | ICD-10-CM | POA: Diagnosis not present

## 2021-03-27 DIAGNOSIS — D509 Iron deficiency anemia, unspecified: Secondary | ICD-10-CM | POA: Diagnosis not present

## 2021-03-29 DIAGNOSIS — N186 End stage renal disease: Secondary | ICD-10-CM | POA: Diagnosis not present

## 2021-03-29 DIAGNOSIS — Z992 Dependence on renal dialysis: Secondary | ICD-10-CM | POA: Diagnosis not present

## 2021-03-29 DIAGNOSIS — D509 Iron deficiency anemia, unspecified: Secondary | ICD-10-CM | POA: Diagnosis not present

## 2021-03-29 DIAGNOSIS — N2581 Secondary hyperparathyroidism of renal origin: Secondary | ICD-10-CM | POA: Diagnosis not present

## 2021-03-29 DIAGNOSIS — D631 Anemia in chronic kidney disease: Secondary | ICD-10-CM | POA: Diagnosis not present

## 2021-03-31 DIAGNOSIS — N2581 Secondary hyperparathyroidism of renal origin: Secondary | ICD-10-CM | POA: Diagnosis not present

## 2021-03-31 DIAGNOSIS — D631 Anemia in chronic kidney disease: Secondary | ICD-10-CM | POA: Diagnosis not present

## 2021-03-31 DIAGNOSIS — N186 End stage renal disease: Secondary | ICD-10-CM | POA: Diagnosis not present

## 2021-03-31 DIAGNOSIS — Z992 Dependence on renal dialysis: Secondary | ICD-10-CM | POA: Diagnosis not present

## 2021-03-31 DIAGNOSIS — D509 Iron deficiency anemia, unspecified: Secondary | ICD-10-CM | POA: Diagnosis not present

## 2021-04-03 DIAGNOSIS — D631 Anemia in chronic kidney disease: Secondary | ICD-10-CM | POA: Diagnosis not present

## 2021-04-03 DIAGNOSIS — N186 End stage renal disease: Secondary | ICD-10-CM | POA: Diagnosis not present

## 2021-04-03 DIAGNOSIS — D509 Iron deficiency anemia, unspecified: Secondary | ICD-10-CM | POA: Diagnosis not present

## 2021-04-03 DIAGNOSIS — N2581 Secondary hyperparathyroidism of renal origin: Secondary | ICD-10-CM | POA: Diagnosis not present

## 2021-04-03 DIAGNOSIS — Z992 Dependence on renal dialysis: Secondary | ICD-10-CM | POA: Diagnosis not present

## 2021-04-05 DIAGNOSIS — N186 End stage renal disease: Secondary | ICD-10-CM | POA: Diagnosis not present

## 2021-04-05 DIAGNOSIS — D631 Anemia in chronic kidney disease: Secondary | ICD-10-CM | POA: Diagnosis not present

## 2021-04-05 DIAGNOSIS — N2581 Secondary hyperparathyroidism of renal origin: Secondary | ICD-10-CM | POA: Diagnosis not present

## 2021-04-05 DIAGNOSIS — Z992 Dependence on renal dialysis: Secondary | ICD-10-CM | POA: Diagnosis not present

## 2021-04-05 DIAGNOSIS — D509 Iron deficiency anemia, unspecified: Secondary | ICD-10-CM | POA: Diagnosis not present

## 2021-04-07 DIAGNOSIS — D631 Anemia in chronic kidney disease: Secondary | ICD-10-CM | POA: Diagnosis not present

## 2021-04-07 DIAGNOSIS — N2581 Secondary hyperparathyroidism of renal origin: Secondary | ICD-10-CM | POA: Diagnosis not present

## 2021-04-07 DIAGNOSIS — D509 Iron deficiency anemia, unspecified: Secondary | ICD-10-CM | POA: Diagnosis not present

## 2021-04-07 DIAGNOSIS — N186 End stage renal disease: Secondary | ICD-10-CM | POA: Diagnosis not present

## 2021-04-07 DIAGNOSIS — Z992 Dependence on renal dialysis: Secondary | ICD-10-CM | POA: Diagnosis not present

## 2021-04-10 DIAGNOSIS — N2581 Secondary hyperparathyroidism of renal origin: Secondary | ICD-10-CM | POA: Diagnosis not present

## 2021-04-10 DIAGNOSIS — N186 End stage renal disease: Secondary | ICD-10-CM | POA: Diagnosis not present

## 2021-04-10 DIAGNOSIS — D509 Iron deficiency anemia, unspecified: Secondary | ICD-10-CM | POA: Diagnosis not present

## 2021-04-10 DIAGNOSIS — D631 Anemia in chronic kidney disease: Secondary | ICD-10-CM | POA: Diagnosis not present

## 2021-04-10 DIAGNOSIS — Z992 Dependence on renal dialysis: Secondary | ICD-10-CM | POA: Diagnosis not present

## 2021-04-12 DIAGNOSIS — N186 End stage renal disease: Secondary | ICD-10-CM | POA: Diagnosis not present

## 2021-04-12 DIAGNOSIS — N2581 Secondary hyperparathyroidism of renal origin: Secondary | ICD-10-CM | POA: Diagnosis not present

## 2021-04-12 DIAGNOSIS — D631 Anemia in chronic kidney disease: Secondary | ICD-10-CM | POA: Diagnosis not present

## 2021-04-12 DIAGNOSIS — Z992 Dependence on renal dialysis: Secondary | ICD-10-CM | POA: Diagnosis not present

## 2021-04-12 DIAGNOSIS — D509 Iron deficiency anemia, unspecified: Secondary | ICD-10-CM | POA: Diagnosis not present

## 2021-04-13 DIAGNOSIS — Z9889 Other specified postprocedural states: Secondary | ICD-10-CM | POA: Diagnosis not present

## 2021-04-13 DIAGNOSIS — M4312 Spondylolisthesis, cervical region: Secondary | ICD-10-CM | POA: Diagnosis not present

## 2021-04-14 DIAGNOSIS — D631 Anemia in chronic kidney disease: Secondary | ICD-10-CM | POA: Diagnosis not present

## 2021-04-14 DIAGNOSIS — N2581 Secondary hyperparathyroidism of renal origin: Secondary | ICD-10-CM | POA: Diagnosis not present

## 2021-04-14 DIAGNOSIS — Z992 Dependence on renal dialysis: Secondary | ICD-10-CM | POA: Diagnosis not present

## 2021-04-14 DIAGNOSIS — N186 End stage renal disease: Secondary | ICD-10-CM | POA: Diagnosis not present

## 2021-04-14 DIAGNOSIS — D509 Iron deficiency anemia, unspecified: Secondary | ICD-10-CM | POA: Diagnosis not present

## 2021-04-16 ENCOUNTER — Other Ambulatory Visit: Payer: Self-pay | Admitting: Internal Medicine

## 2021-04-17 DIAGNOSIS — D631 Anemia in chronic kidney disease: Secondary | ICD-10-CM | POA: Diagnosis not present

## 2021-04-17 DIAGNOSIS — Z992 Dependence on renal dialysis: Secondary | ICD-10-CM | POA: Diagnosis not present

## 2021-04-17 DIAGNOSIS — D509 Iron deficiency anemia, unspecified: Secondary | ICD-10-CM | POA: Diagnosis not present

## 2021-04-17 DIAGNOSIS — N2581 Secondary hyperparathyroidism of renal origin: Secondary | ICD-10-CM | POA: Diagnosis not present

## 2021-04-17 DIAGNOSIS — N186 End stage renal disease: Secondary | ICD-10-CM | POA: Diagnosis not present

## 2021-04-18 ENCOUNTER — Telehealth: Payer: Self-pay | Admitting: Hematology and Oncology

## 2021-04-18 NOTE — Telephone Encounter (Signed)
Scheduled appt per 3/23 referral. Pt is aware of appt date and time. Pt is aware to arrive 15 mins prior to appt time and to bring and updated insurance card. Pt is aware of appt location.   ?

## 2021-04-19 DIAGNOSIS — D631 Anemia in chronic kidney disease: Secondary | ICD-10-CM | POA: Diagnosis not present

## 2021-04-19 DIAGNOSIS — D509 Iron deficiency anemia, unspecified: Secondary | ICD-10-CM | POA: Diagnosis not present

## 2021-04-19 DIAGNOSIS — N186 End stage renal disease: Secondary | ICD-10-CM | POA: Diagnosis not present

## 2021-04-19 DIAGNOSIS — N2581 Secondary hyperparathyroidism of renal origin: Secondary | ICD-10-CM | POA: Diagnosis not present

## 2021-04-19 DIAGNOSIS — Z992 Dependence on renal dialysis: Secondary | ICD-10-CM | POA: Diagnosis not present

## 2021-04-21 DIAGNOSIS — D631 Anemia in chronic kidney disease: Secondary | ICD-10-CM | POA: Diagnosis not present

## 2021-04-21 DIAGNOSIS — Z992 Dependence on renal dialysis: Secondary | ICD-10-CM | POA: Diagnosis not present

## 2021-04-21 DIAGNOSIS — N186 End stage renal disease: Secondary | ICD-10-CM | POA: Diagnosis not present

## 2021-04-21 DIAGNOSIS — D509 Iron deficiency anemia, unspecified: Secondary | ICD-10-CM | POA: Diagnosis not present

## 2021-04-21 DIAGNOSIS — N2581 Secondary hyperparathyroidism of renal origin: Secondary | ICD-10-CM | POA: Diagnosis not present

## 2021-04-22 DIAGNOSIS — T8612 Kidney transplant failure: Secondary | ICD-10-CM | POA: Diagnosis not present

## 2021-04-22 DIAGNOSIS — N186 End stage renal disease: Secondary | ICD-10-CM | POA: Diagnosis not present

## 2021-04-22 DIAGNOSIS — Z992 Dependence on renal dialysis: Secondary | ICD-10-CM | POA: Diagnosis not present

## 2021-04-24 DIAGNOSIS — D631 Anemia in chronic kidney disease: Secondary | ICD-10-CM | POA: Diagnosis not present

## 2021-04-24 DIAGNOSIS — N2581 Secondary hyperparathyroidism of renal origin: Secondary | ICD-10-CM | POA: Diagnosis not present

## 2021-04-24 DIAGNOSIS — N186 End stage renal disease: Secondary | ICD-10-CM | POA: Diagnosis not present

## 2021-04-24 DIAGNOSIS — Z992 Dependence on renal dialysis: Secondary | ICD-10-CM | POA: Diagnosis not present

## 2021-04-24 DIAGNOSIS — D509 Iron deficiency anemia, unspecified: Secondary | ICD-10-CM | POA: Diagnosis not present

## 2021-04-26 DIAGNOSIS — N186 End stage renal disease: Secondary | ICD-10-CM | POA: Diagnosis not present

## 2021-04-26 DIAGNOSIS — Z992 Dependence on renal dialysis: Secondary | ICD-10-CM | POA: Diagnosis not present

## 2021-04-26 DIAGNOSIS — D509 Iron deficiency anemia, unspecified: Secondary | ICD-10-CM | POA: Diagnosis not present

## 2021-04-26 DIAGNOSIS — N2581 Secondary hyperparathyroidism of renal origin: Secondary | ICD-10-CM | POA: Diagnosis not present

## 2021-04-26 DIAGNOSIS — D631 Anemia in chronic kidney disease: Secondary | ICD-10-CM | POA: Diagnosis not present

## 2021-04-27 DIAGNOSIS — Z20822 Contact with and (suspected) exposure to covid-19: Secondary | ICD-10-CM | POA: Diagnosis not present

## 2021-04-28 DIAGNOSIS — N2581 Secondary hyperparathyroidism of renal origin: Secondary | ICD-10-CM | POA: Diagnosis not present

## 2021-04-28 DIAGNOSIS — N186 End stage renal disease: Secondary | ICD-10-CM | POA: Diagnosis not present

## 2021-04-28 DIAGNOSIS — Z992 Dependence on renal dialysis: Secondary | ICD-10-CM | POA: Diagnosis not present

## 2021-04-28 DIAGNOSIS — D509 Iron deficiency anemia, unspecified: Secondary | ICD-10-CM | POA: Diagnosis not present

## 2021-04-28 DIAGNOSIS — D631 Anemia in chronic kidney disease: Secondary | ICD-10-CM | POA: Diagnosis not present

## 2021-05-01 DIAGNOSIS — Z992 Dependence on renal dialysis: Secondary | ICD-10-CM | POA: Diagnosis not present

## 2021-05-01 DIAGNOSIS — N186 End stage renal disease: Secondary | ICD-10-CM | POA: Diagnosis not present

## 2021-05-01 DIAGNOSIS — D509 Iron deficiency anemia, unspecified: Secondary | ICD-10-CM | POA: Diagnosis not present

## 2021-05-01 DIAGNOSIS — D631 Anemia in chronic kidney disease: Secondary | ICD-10-CM | POA: Diagnosis not present

## 2021-05-01 DIAGNOSIS — N2581 Secondary hyperparathyroidism of renal origin: Secondary | ICD-10-CM | POA: Diagnosis not present

## 2021-05-02 NOTE — Progress Notes (Signed)
[place] ?CONSULT NOTE ? ?Patient Care Team: ?Ngetich, Nelda Bucks, NP as PCP - General (Family Medicine) ?[name], MD as PCP - Cardiology (Cardiology) ?Jovita Kussmaul, MD as Consulting Physician (General Surgery) ?[name], MD as Consulting Physician (Pulmonary Disease) ?Estanislado Emms, MD (Inactive) as Consulting Physician (Nephrology) ?Elam Dutch, MD (Inactive) as Consulting Physician (Vascular Surgery) ?[name], MD as Consulting Physician (Cardiology) ?Milus Banister, MD as Attending Physician (Gastroenterology) ?Jamal Maes, MD as Consulting Physician (Nephrology) ? ?CHIEF COMPLAINTS/PURPOSE OF CONSULTATION: Pancytopenia ? ? ?HISTORY OF PRESENTING ILLNESS:  ?Kirk [name] 61 y.o. male is here because of recent diagnosis of Pancytopenia. He presents to the clinic today for consult and labs.  He has end-stage kidney disease on hemodialysis for the past 15 years.  He has had episodic urinary infections and bleeding from the urine as well as the bowels.  He reports that occasionally he gets infusions of iron with dialysis. ? ?I reviewed her records extensively and collaborated the history with the patient. ? ?MEDICAL HISTORY:  ?Past Medical History:  ?Diagnosis Date  ? Acute edema of lung, unspecified   ? Acute, but ill-defined, cerebrovascular disease   ? Allergy   ? Anemia   ? 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)   ? Anxiety   ? Asthma   ? Asthma   ? moderate persistent  ? Carpal tunnel syndrome   ? Cellulitis and abscess of trunk   ? Cholelithiasis 07/13/2014  ? Chronic headaches   ? Debility, unspecified   ? Dermatophytosis of the body   ? Dysrhythmia   ? history of  ? Edema   ? End stage renal disease on dialysis [facility])   ? "MWF; [facility] in [place]" (10/21/2014)  ? Essential hypertension, benign   ? GERD (gastroesophageal reflux disease)   ? Gout, unspecified   ? HTN (hypertension)   ? Hypertrophy of prostate without urinary obstruction and other  lower urinary tract symptoms (LUTS)   ? Hypotension, unspecified   ? Impotence of organic origin   ? Insomnia, unspecified   ? Kidney replaced by transplant   ? Localization-related (focal) (partial) epilepsy and epileptic syndromes with complex partial seizures, without mention of intractable epilepsy   ? 12-15-19- Wife states he has NEVER had a seizure   ? Lumbago   ? Memory loss   ? OSA on CPAP   ? Other and unspecified hyperlipidemia   ? controlled /managed per wife   ? Other chronic nonalcoholic liver disease   ? Other malaise and fatigue   ? Other nonspecific abnormal serum enzyme levels   ? Pain in joint, lower leg   ? Pain in joint, upper arm   ? Pneumonia "several times"  ? Renal dialysis status(V45.11) 02/05/2010  ? restarted 01/02/13 ofter renal trransplant failure  ? Secondary hyperparathyroidism (of renal origin)   ? Shortness of breath   ? Sleep apnea   ? wears cpap   ? Tension headache   ? Unspecified constipation   ? Unspecified essential hypertension   ? Unspecified hereditary and idiopathic peripheral neuropathy   ? Unspecified vitamin D deficiency   ? ? ?SURGICAL HISTORY: ?Past Surgical History:  ?Procedure Laterality Date  ? AV FISTULA PLACEMENT Left ?2010  ? "forearm; at [facility]"  ? BACK SURGERY    ? CARDIAC CATHETERIZATION  03/21/2011  ? CHOLECYSTECTOMY N/A 10/21/2014  ? Procedure: LAPAROSCOPIC CHOLECYSTECTOMY WITH INTRAOPERATIVE CHOLANGIOGRAM;  Surgeon: Autumn Messing III, MD;  Location: [facility] OR;  Service: General;  Laterality: N/A;  ? COLONOSCOPY    ? 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 KIDNEY TRANSPLANT  08/17/2011  ? [facility]   ? LAPAROSCOPIC CHOLECYSTECTOMY  10/21/2014  ? w/IOC  ? LEFT HEART CATHETERIZATION WITH CORONARY ANGIOGRAM N/A 03/21/2011  ? Procedure: LEFT HEART CATHETERIZATION WITH CORONARY ANGIOGRAM;  Surgeon: [name], MD;  Location: [facility] CATH LAB;  Service: Cardiovascular;  Laterality: N/A;  ? NEPHRECTOMY  08/2013  ? removed transplaned kidney  ?  POLYPECTOMY    ? POSTERIOR FUSION CERVICAL SPINE  06/25/2012  ? for spinal stenosis  ? VASECTOMY  2010  ? ? ?SOCIAL HISTORY: ?Social History  ? ?Socioeconomic History  ? Marital status: Married  ?  Spouse name: [name]  ? Number of children: 3  ? Years of education: Not on file  ? Highest education level: Not on file  ?Occupational History  ? Occupation: disabled  ?  Employer: DISABLED  ?Tobacco Use  ? Smoking status: Former  ?  Packs/day: 0.50  ?  Years: 32.00  ?  Pack years: 16.00  ?  Types: Cigarettes  ?  Start date: 70  ?  Quit date: 02/24/2020  ?  Years since quitting: 1.2  ? Smokeless tobacco: Never  ?Vaping Use  ? Vaping Use: Never used  ?Substance and Sexual Activity  ? Alcohol use: No  ?  Alcohol/week: 0.0 standard drinks  ? Drug use: No  ? Sexual activity: Yes  ?Other Topics Concern  ? Not on file  ?Social History Narrative  ? Drinks 1 cup of caffeine daily.  ? ?Social Determinants of Health  ? ?Financial Resource Strain: Not on file  ?Food Insecurity: Not on file  ?Transportation Needs: Not on file  ?Physical Activity: Not on file  ?Stress: Not on file  ?Social Connections: Not on file  ?Intimate Partner Violence: Not on file  ? ? ?FAMILY HISTORY: ?Family History  ?Adopted: Yes  ?Problem Relation Age of Onset  ? Colon cancer Neg Hx   ? Esophageal cancer Neg Hx   ? Rectal cancer Neg Hx   ? Stomach cancer Neg Hx   ? Colon polyps Neg Hx   ? ? ?ALLERGIES:  is allergic to codeine and hydrocodone-acetaminophen. ? ?MEDICATIONS:  ?Current Outpatient Medications  ?Medication Sig Dispense Refill  ? acetaminophen (TYLENOL) 500 MG tablet Take 1 tablet (500 mg total) by mouth every 6 (six) hours as needed for moderate pain.    ? albuterol (PROVENTIL HFA;VENTOLIN HFA) 108 (90 Base) MCG/ACT inhaler Inhale 2 puffs into the lungs every 6 (six) hours as needed for wheezing or shortness of breath. 1 Inhaler 0  ? amLODipine (NORVASC) 10 MG tablet TAKE 1 TABLET BY MOUTH EVERY DAY 90 tablet 3  ? aspirin EC 81 MG tablet Take 81 mg  by mouth daily.    ? b complex-vitamin c-folic acid (NEPHRO-VITE) 0.8 MG TABS tablet TAKE 1 TABLET BY MOUTH EVERY DAY 90 tablet 3  ? budesonide-formoterol (SYMBICORT) 160-4.5 MCG/ACT inhaler Inhale 2 puffs into the lungs 2 (two) times daily. 1 Inhaler 0  ? buPROPion (WELLBUTRIN XL) 150 MG 24 hr tablet Take by mouth as needed.    ? calcitRIOL (ROCALTROL) 0.5 MCG capsule Take 2 capsules (1 mcg total) by mouth every Monday, Wednesday, and Friday with hemodialysis. 60 capsule 1  ? chlorhexidine (PERIDEX) 0.12 % solution SMARTSIG:By Mouth    ? Cholecalciferol (VITAMIN D3) 25 MCG (1000 UT) CAPS Take  4,000 Units by mouth daily.    ? cinacalcet (SENSIPAR) 90 MG tablet Take 90 mg by mouth every evening.    ? diphenhydrAMINE (BENADRYL) 25 MG tablet Take 50 mg by mouth at bedtime.    ? doxazosin (CARDURA) 4 MG tablet Take 4 mg by mouth daily.    ? DULoxetine (CYMBALTA) 30 MG capsule TAKE 1 CAPSULE BY MOUTH EVERY DAY 90 capsule 1  ? famotidine (PEPCID) 20 MG tablet Take 1 tablet (20 mg total) by mouth at bedtime.    ? folic acid in sodium chloride 0.9 % 50 mL Inject into the vein. And B-Complex.    ? guaiFENesin (MUCINEX) 600 MG 12 hr tablet Take 1,200 mg by mouth 2 (two) times daily.    ? hydrALAZINE (APRESOLINE) 100 MG tablet TAKE 1 TABLET EVERY MORNINGAND AT BEDTIME. 180 tablet 3  ? ipratropium-albuterol (DUONEB) 0.5-2.5 (3) MG/3ML SOLN Take 3 mLs by nebulization every 4 (four) hours as needed (Shortness of breath). 360 mL 0  ? isosorbide mononitrate (IMDUR) 30 MG 24 hr tablet TAKE 1 TABLET DAILY 90 tablet 1  ? lidocaine-prilocaine (EMLA) cream Apply 1 application topically See admin instructions. Apply 1 to 2 hours prior to dialysis on Mondays, Wednesdays, and Fridays. Cover with saran wrap.    ? metoprolol tartrate (LOPRESSOR) 100 MG tablet TAKE 1 TABLET BY MOUTH TWICE A DAY 180 tablet 1  ? omeprazole (PRILOSEC) 20 MG capsule TAKE 1 CAPSULE TWICE DAILY BEFORE A MEAL 180 capsule 3  ? ondansetron (ZOFRAN ODT) 4 MG  disintegrating tablet Take 1 tablet (4 mg total) by mouth every 8 (eight) hours as needed for nausea or vomiting. 14 tablet 0  ? pravastatin (PRAVACHOL) 40 MG tablet TAKE 1 TABLET BY MOUTH EVERY DAY IN THE EVEN

## 2021-05-03 DIAGNOSIS — N2581 Secondary hyperparathyroidism of renal origin: Secondary | ICD-10-CM | POA: Diagnosis not present

## 2021-05-03 DIAGNOSIS — N186 End stage renal disease: Secondary | ICD-10-CM | POA: Diagnosis not present

## 2021-05-03 DIAGNOSIS — D509 Iron deficiency anemia, unspecified: Secondary | ICD-10-CM | POA: Diagnosis not present

## 2021-05-03 DIAGNOSIS — D631 Anemia in chronic kidney disease: Secondary | ICD-10-CM | POA: Diagnosis not present

## 2021-05-03 DIAGNOSIS — Z992 Dependence on renal dialysis: Secondary | ICD-10-CM | POA: Diagnosis not present

## 2021-05-05 DIAGNOSIS — D631 Anemia in chronic kidney disease: Secondary | ICD-10-CM | POA: Diagnosis not present

## 2021-05-05 DIAGNOSIS — N2581 Secondary hyperparathyroidism of renal origin: Secondary | ICD-10-CM | POA: Diagnosis not present

## 2021-05-05 DIAGNOSIS — D509 Iron deficiency anemia, unspecified: Secondary | ICD-10-CM | POA: Diagnosis not present

## 2021-05-05 DIAGNOSIS — N186 End stage renal disease: Secondary | ICD-10-CM | POA: Diagnosis not present

## 2021-05-05 DIAGNOSIS — Z992 Dependence on renal dialysis: Secondary | ICD-10-CM | POA: Diagnosis not present

## 2021-05-08 DIAGNOSIS — D631 Anemia in chronic kidney disease: Secondary | ICD-10-CM | POA: Diagnosis not present

## 2021-05-08 DIAGNOSIS — N2581 Secondary hyperparathyroidism of renal origin: Secondary | ICD-10-CM | POA: Diagnosis not present

## 2021-05-08 DIAGNOSIS — Z992 Dependence on renal dialysis: Secondary | ICD-10-CM | POA: Diagnosis not present

## 2021-05-08 DIAGNOSIS — D509 Iron deficiency anemia, unspecified: Secondary | ICD-10-CM | POA: Diagnosis not present

## 2021-05-08 DIAGNOSIS — N186 End stage renal disease: Secondary | ICD-10-CM | POA: Diagnosis not present

## 2021-05-10 DIAGNOSIS — N2581 Secondary hyperparathyroidism of renal origin: Secondary | ICD-10-CM | POA: Diagnosis not present

## 2021-05-10 DIAGNOSIS — N186 End stage renal disease: Secondary | ICD-10-CM | POA: Diagnosis not present

## 2021-05-10 DIAGNOSIS — Z992 Dependence on renal dialysis: Secondary | ICD-10-CM | POA: Diagnosis not present

## 2021-05-10 DIAGNOSIS — D509 Iron deficiency anemia, unspecified: Secondary | ICD-10-CM | POA: Diagnosis not present

## 2021-05-10 DIAGNOSIS — D631 Anemia in chronic kidney disease: Secondary | ICD-10-CM | POA: Diagnosis not present

## 2021-05-11 ENCOUNTER — Ambulatory Visit: Payer: Medicare Other | Admitting: Family

## 2021-05-12 DIAGNOSIS — D631 Anemia in chronic kidney disease: Secondary | ICD-10-CM | POA: Diagnosis not present

## 2021-05-12 DIAGNOSIS — N2581 Secondary hyperparathyroidism of renal origin: Secondary | ICD-10-CM | POA: Diagnosis not present

## 2021-05-12 DIAGNOSIS — Z992 Dependence on renal dialysis: Secondary | ICD-10-CM | POA: Diagnosis not present

## 2021-05-12 DIAGNOSIS — D509 Iron deficiency anemia, unspecified: Secondary | ICD-10-CM | POA: Diagnosis not present

## 2021-05-12 DIAGNOSIS — N186 End stage renal disease: Secondary | ICD-10-CM | POA: Diagnosis not present

## 2021-05-15 DIAGNOSIS — N186 End stage renal disease: Secondary | ICD-10-CM | POA: Diagnosis not present

## 2021-05-15 DIAGNOSIS — D631 Anemia in chronic kidney disease: Secondary | ICD-10-CM | POA: Diagnosis not present

## 2021-05-15 DIAGNOSIS — D509 Iron deficiency anemia, unspecified: Secondary | ICD-10-CM | POA: Diagnosis not present

## 2021-05-15 DIAGNOSIS — N2581 Secondary hyperparathyroidism of renal origin: Secondary | ICD-10-CM | POA: Diagnosis not present

## 2021-05-15 DIAGNOSIS — Z992 Dependence on renal dialysis: Secondary | ICD-10-CM | POA: Diagnosis not present

## 2021-05-16 ENCOUNTER — Inpatient Hospital Stay: Payer: Medicare Other

## 2021-05-16 ENCOUNTER — Inpatient Hospital Stay: Payer: Medicare Other | Attending: Hematology and Oncology | Admitting: Hematology and Oncology

## 2021-05-16 ENCOUNTER — Other Ambulatory Visit: Payer: Self-pay

## 2021-05-16 DIAGNOSIS — Z992 Dependence on renal dialysis: Secondary | ICD-10-CM | POA: Diagnosis not present

## 2021-05-16 DIAGNOSIS — N181 Chronic kidney disease, stage 1: Secondary | ICD-10-CM

## 2021-05-16 DIAGNOSIS — D61818 Other pancytopenia: Secondary | ICD-10-CM | POA: Diagnosis not present

## 2021-05-16 DIAGNOSIS — N186 End stage renal disease: Secondary | ICD-10-CM | POA: Insufficient documentation

## 2021-05-16 LAB — CMP (CANCER CENTER ONLY)
ALT: 9 U/L (ref 0–44)
AST: 11 U/L — ABNORMAL LOW (ref 15–41)
Albumin: 4.1 g/dL (ref 3.5–5.0)
Alkaline Phosphatase: 57 U/L (ref 38–126)
Anion gap: 11 (ref 5–15)
BUN: 35 mg/dL — ABNORMAL HIGH (ref 6–20)
CO2: 30 mmol/L (ref 22–32)
Calcium: 9.9 mg/dL (ref 8.9–10.3)
Chloride: 100 mmol/L (ref 98–111)
Creatinine: 11.65 mg/dL (ref 0.61–1.24)
GFR, Estimated: 5 mL/min — ABNORMAL LOW (ref >60)
Glucose, Bld: 126 mg/dL — ABNORMAL HIGH (ref 70–99)
Potassium: 4.3 mmol/L (ref 3.5–5.1)
Sodium: 141 mmol/L (ref 135–145)
Total Bilirubin: 0.4 mg/dL (ref 0.3–1.2)
Total Protein: 7.1 g/dL (ref 6.5–8.1)

## 2021-05-16 LAB — CBC WITH DIFFERENTIAL (CANCER CENTER ONLY)
Abs Immature Granulocytes: 0.01 10*3/uL (ref 0.00–0.07)
Basophils Absolute: 0 10*3/uL (ref 0.0–0.1)
Basophils Relative: 1 %
Eosinophils Absolute: 0.1 10*3/uL (ref 0.0–0.5)
Eosinophils Relative: 2 %
HCT: 34.6 % — ABNORMAL LOW (ref 39.0–52.0)
Hemoglobin: 11 g/dL — ABNORMAL LOW (ref 13.0–17.0)
Immature Granulocytes: 0 %
Lymphocytes Relative: 23 %
Lymphs Abs: 0.7 10*3/uL (ref 0.7–4.0)
MCH: 32 pg (ref 26.0–34.0)
MCHC: 31.8 g/dL (ref 30.0–36.0)
MCV: 100.6 fL — ABNORMAL HIGH (ref 80.0–100.0)
Monocytes Absolute: 0.4 10*3/uL (ref 0.1–1.0)
Monocytes Relative: 14 %
Neutro Abs: 1.9 10*3/uL (ref 1.7–7.7)
Neutrophils Relative %: 60 %
Platelet Count: 109 10*3/uL — ABNORMAL LOW (ref 150–400)
RBC: 3.44 MIL/uL — ABNORMAL LOW (ref 4.22–5.81)
RDW: 14 % (ref 11.5–15.5)
WBC Count: 3.1 10*3/uL — ABNORMAL LOW (ref 4.0–10.5)
nRBC: 0 % (ref 0.0–0.2)

## 2021-05-16 LAB — RETIC PANEL
Immature Retic Fract: 26.9 % — ABNORMAL HIGH (ref 2.3–15.9)
RBC.: 3.38 MIL/uL — ABNORMAL LOW (ref 4.22–5.81)
Retic Count, Absolute: 104.8 10*3/uL (ref 19.0–186.0)
Retic Ct Pct: 3.1 % (ref 0.4–3.1)
Reticulocyte Hemoglobin: 36.7 pg (ref >27.9)

## 2021-05-16 LAB — IRON AND IRON BINDING CAPACITY (CC-WL,HP ONLY)
Iron: 58 ug/dL (ref 45–182)
Saturation Ratios: 25 % (ref 17.9–39.5)
TIBC: 232 ug/dL — ABNORMAL LOW (ref 250–450)
UIBC: 174 ug/dL (ref 117–376)

## 2021-05-16 LAB — VITAMIN B12: Vitamin B-12: 302 pg/mL (ref 180–914)

## 2021-05-16 LAB — LACTATE DEHYDROGENASE: LDH: 121 U/L (ref 98–192)

## 2021-05-16 LAB — FOLATE: Folate: 7.6 ng/mL (ref >5.9)

## 2021-05-16 NOTE — Assessment & Plan Note (Addendum)
Lab review  ?03/03/2020: WBC 4.8, hemoglobin 9.8, platelets 84 ?01/11/2021: WBC 3.6, hemoglobin 11.6, platelets 98, creatinine 7.84 ?03/21/2021: WBC 2.5, hemoglobin 8.3, MCV 98.5, platelets 73, creatinine 15.97 ? ?Patient has multiple chronic illnesses including end-stage kidney disease on hemodialysis, [place] syndrome, anemia of chronic kidney disease, CHF, GI bleed, diabetes ? ?We will obtain CBC with differential, CMP, ANA, iron studies, B12, folate, erythropoietin, LDH, SPEP ?I believe the cause of the pancytopenia is also multifactorial ?The test results did not yield any positive findings and we will have to consider doing a bone marrow biopsy ?

## 2021-05-17 ENCOUNTER — Telehealth: Payer: Self-pay | Admitting: Hematology and Oncology

## 2021-05-17 DIAGNOSIS — N186 End stage renal disease: Secondary | ICD-10-CM | POA: Diagnosis not present

## 2021-05-17 DIAGNOSIS — Z992 Dependence on renal dialysis: Secondary | ICD-10-CM | POA: Diagnosis not present

## 2021-05-17 DIAGNOSIS — D631 Anemia in chronic kidney disease: Secondary | ICD-10-CM | POA: Diagnosis not present

## 2021-05-17 DIAGNOSIS — N2581 Secondary hyperparathyroidism of renal origin: Secondary | ICD-10-CM | POA: Diagnosis not present

## 2021-05-17 DIAGNOSIS — D509 Iron deficiency anemia, unspecified: Secondary | ICD-10-CM | POA: Diagnosis not present

## 2021-05-17 LAB — ERYTHROPOIETIN: Erythropoietin: 163.8 m[IU]/mL — ABNORMAL HIGH (ref 2.6–18.5)

## 2021-05-17 LAB — ANTINUCLEAR ANTIBODIES, IFA: ANA Ab, IFA: NEGATIVE

## 2021-05-17 LAB — FERRITIN: Ferritin: 827 ng/mL — ABNORMAL HIGH (ref 24–336)

## 2021-05-17 NOTE — Telephone Encounter (Signed)
Scheduled appointment per 4/25 los. Patient was at dialysis; therefore, I talked with the patients wife [name]. She is aware of the upcoming appointment. ?

## 2021-05-18 ENCOUNTER — Ambulatory Visit (INDEPENDENT_AMBULATORY_CARE_PROVIDER_SITE_OTHER): Payer: Medicare Other | Admitting: Family

## 2021-05-18 ENCOUNTER — Encounter: Payer: Self-pay | Admitting: Family

## 2021-05-18 VITALS — BP 140/62 | HR 65 | Temp 97.3°F | Resp 18 | Ht 67.0 in | Wt 201.0 lb

## 2021-05-18 DIAGNOSIS — M545 Low back pain, unspecified: Secondary | ICD-10-CM | POA: Diagnosis not present

## 2021-05-18 DIAGNOSIS — G8929 Other chronic pain: Secondary | ICD-10-CM

## 2021-05-18 DIAGNOSIS — K219 Gastro-esophageal reflux disease without esophagitis: Secondary | ICD-10-CM

## 2021-05-18 DIAGNOSIS — E782 Mixed hyperlipidemia: Secondary | ICD-10-CM | POA: Diagnosis not present

## 2021-05-18 DIAGNOSIS — E1121 Type 2 diabetes mellitus with diabetic nephropathy: Secondary | ICD-10-CM | POA: Diagnosis not present

## 2021-05-18 DIAGNOSIS — N186 End stage renal disease: Secondary | ICD-10-CM

## 2021-05-18 DIAGNOSIS — I120 Hypertensive chronic kidney disease with stage 5 chronic kidney disease or end stage renal disease: Secondary | ICD-10-CM

## 2021-05-18 LAB — PROTEIN ELECTROPHORESIS, SERUM, WITH REFLEX
A/G Ratio: 1.5 (ref 0.7–1.7)
Albumin ELP: 3.8 g/dL (ref 2.9–4.4)
Alpha-1-Globulin: 0.2 g/dL (ref 0.0–0.4)
Alpha-2-Globulin: 0.5 g/dL (ref 0.4–1.0)
Beta Globulin: 0.7 g/dL (ref 0.7–1.3)
Gamma Globulin: 1.2 g/dL (ref 0.4–1.8)
Globulin, Total: 2.6 g/dL (ref 2.2–3.9)
Total Protein ELP: 6.4 g/dL (ref 6.0–8.5)

## 2021-05-18 NOTE — Progress Notes (Signed)
? ?Provider: Marlowe Sax FNP-C  ? ?Izadora Roehr, Nelda Bucks, NP ? ?Patient Care Team: ?Calissa Swenor, Nelda Bucks, NP as PCP - General (Family Medicine) ?[name], MD as PCP - Cardiology (Cardiology) ?Jovita Kussmaul, MD as Consulting Physician (General Surgery) ?[name], MD as Consulting Physician (Pulmonary Disease) ?Estanislado Emms, MD (Inactive) as Consulting Physician (Nephrology) ?Elam Dutch, MD (Inactive) as Consulting Physician (Vascular Surgery) ?[name], MD as Consulting Physician (Cardiology) ?Milus Banister, MD as Attending Physician (Gastroenterology) ?Jamal Maes, MD as Consulting Physician (Nephrology) ? ?Extended Emergency Contact Information ?Primary Emergency Contact: [name] ?Address: [address]         [place], [place] [address] [place] ?Home Phone: [phone] ?Mobile Phone: [phone] ?Relation: Spouse ? ?Code Status:  Full Code  ?Goals of care: Advanced Directive information ? ?  05/18/2021  ?  2:52 PM  ?Advanced Directives  ?Does Patient Have a Medical Advance Directive? No  ?Would patient like information on creating a medical advance directive? No - Patient declined  ? ? ? ?Chief Complaint  ?Patient presents with  ? Medical Management of Chronic Issues  ?  6 month follow up.  ? Health Maintenance  ?  Discuss the need for Hemoglobin A1C.  ? Immunizations  ?  Discuss the need for Shingirx vaccine, Tetanus vaccine, and Covid Booster.  ? ? ?HPI:  ?Pt is a 61 y.o. male seen today for 6 months follow-up for medical management of chronic diseases.  He is here with the wife who provides additional information.  He has a medical history of type 2 diabetes mellitus with peripheral neuropathy, hypertension with end-stage renal disease, hyperlipidemia, anemia of chronic kidney disease, chronic congestive heart failure, GERD, chronic low back pain without sciatic, s/p cervical spinal fusion done by neurosurgeon Dr. Jinny Sanders.  He states pain still worse  even after surgery.  He has been walking exercise more with the wife. ?He denies any recent fall episodes ?Also follows up with oncologist Dr. Nicholas Lose for recent diagnosis of pancytopenia.  Last seen 05/16/2021. Plan for bone marrow biopsy ? ?Health maintenance: ?Due for Shingrix, tetanus and COVID booster vaccine.  Made aware to get vaccines at his pharmacy. ? ? ?Past Medical History:  ?Diagnosis Date  ? Acute edema of lung, unspecified   ? Acute, but ill-defined, cerebrovascular disease   ? Allergy   ? Anemia   ? 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)   ? Anxiety   ? Asthma   ? Asthma   ? moderate persistent  ? Carpal tunnel syndrome   ? Cellulitis and abscess of trunk   ? Cholelithiasis 07/13/2014  ? Chronic headaches   ? Debility, unspecified   ? Dermatophytosis of the body   ? Dysrhythmia   ? history of  ? Edema   ? End stage renal disease on dialysis [facility])   ? "MWF; [facility] in [place]" (10/21/2014)  ? Essential hypertension, benign   ? GERD (gastroesophageal reflux disease)   ? Gout, unspecified   ? HTN (hypertension)   ? Hypertrophy of prostate without urinary obstruction and other lower urinary tract symptoms (LUTS)   ? Hypotension, unspecified   ? Impotence of organic origin   ? Insomnia, unspecified   ? Kidney replaced by transplant   ? Localization-related (focal) (partial) epilepsy and epileptic syndromes with complex partial seizures, without mention of intractable epilepsy   ? 12-15-19- Wife states he has NEVER had a seizure   ? Lumbago   ?  Memory loss   ? OSA on CPAP   ? Other and unspecified hyperlipidemia   ? controlled /managed per wife   ? Other chronic nonalcoholic liver disease   ? Other malaise and fatigue   ? Other nonspecific abnormal serum enzyme levels   ? Pain in joint, lower leg   ? Pain in joint, upper arm   ? Pneumonia "several times"  ? Renal dialysis status(V45.11) 02/05/2010  ? restarted 01/02/13 ofter renal trransplant failure  ? Secondary hyperparathyroidism (of  renal origin)   ? Shortness of breath   ? Sleep apnea   ? wears cpap   ? Tension headache   ? Unspecified constipation   ? Unspecified essential hypertension   ? Unspecified hereditary and idiopathic peripheral neuropathy   ? Unspecified vitamin D deficiency   ? ?Past Surgical History:  ?Procedure Laterality Date  ? AV FISTULA PLACEMENT Left ?2010  ? "forearm; at [facility]"  ? BACK SURGERY    ? CARDIAC CATHETERIZATION  03/21/2011  ? CHOLECYSTECTOMY N/A 10/21/2014  ? Procedure: LAPAROSCOPIC CHOLECYSTECTOMY WITH INTRAOPERATIVE CHOLANGIOGRAM;  Surgeon: Autumn Messing III, MD;  Location: [facility];  Service: General;  Laterality: N/A;  ? COLONOSCOPY    ? 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 KIDNEY TRANSPLANT  08/17/2011  ? [facility]   ? LAPAROSCOPIC CHOLECYSTECTOMY  10/21/2014  ? w/IOC  ? LEFT HEART CATHETERIZATION WITH CORONARY ANGIOGRAM N/A 03/21/2011  ? Procedure: LEFT HEART CATHETERIZATION WITH CORONARY ANGIOGRAM;  Surgeon: [name], MD;  Location: [facility] CATH LAB;  Service: Cardiovascular;  Laterality: N/A;  ? NEPHRECTOMY  08/2013  ? removed transplaned kidney  ? POLYPECTOMY    ? POSTERIOR FUSION CERVICAL SPINE  06/25/2012  ? for spinal stenosis  ? VASECTOMY  2010  ? ? ?Allergies  ?Allergen Reactions  ? Codeine Nausea And Vomiting  ? Hydrocodone-Acetaminophen Nausea And Vomiting  ? ? ?Allergies as of 05/18/2021   ? ?   Reactions  ? Codeine Nausea And Vomiting  ? Hydrocodone-acetaminophen Nausea And Vomiting  ? ?  ? ?  ?Medication List  ?  ? ?  ? Accurate as of May 18, 2021  3:01 PM. If you have any questions, ask your nurse or doctor.  ?  ?  ? ?  ? ?STOP taking these medications   ? ?cinacalcet 90 MG tablet ?Commonly known as: SENSIPAR ?Stopped by: Sandrea Hughs, NP ?  ?DULoxetine 30 MG capsule ?Commonly known as: CYMBALTA ?Stopped by: Sandrea Hughs, NP ?  ?famotidine 20 MG tablet ?Commonly known as: Pepcid ?Stopped by: Sandrea Hughs, NP ?  ? ?  ? ?TAKE these medications    ? ?acetaminophen 500 MG tablet ?Commonly known as: TYLENOL ?Take 1 tablet (500 mg total) by mouth every 6 (six) hours as needed for moderate pain. ?  ?albuterol 108 (90 Base) MCG/ACT inhaler ?Commonly known as: VENTOLIN HFA ?Inhale 2 puffs into the lungs every 6 (six) hours as needed for wheezing or shortness of breath. ?  ?amLODipine 10 MG tablet ?Commonly known as: NORVASC ?TAKE 1 TABLET BY MOUTH EVERY DAY ?  ?aspirin EC 81 MG tablet ?Take 81 mg by mouth daily. ?  ?b complex-vitamin c-folic acid 0.8 MG Tabs tablet ?TAKE 1 TABLET BY MOUTH EVERY DAY ?  ?budesonide-formoterol 160-4.5 MCG/ACT inhaler ?Commonly known as: Symbicort ?Inhale 2 puffs into the lungs 2 (two) times daily. ?  ?buPROPion 150 MG 24 hr tablet ?Commonly known as: WELLBUTRIN XL ?Take  by mouth as needed. ?  ?calcitRIOL 0.5 MCG capsule ?Commonly known as: ROCALTROL ?Take 2 capsules (1 mcg total) by mouth every Monday, Wednesday, and Friday with hemodialysis. ?  ?chlorhexidine 0.12 % solution ?Commonly known as: PERIDEX ?SMARTSIG:By Mouth ?  ?diphenhydrAMINE 25 MG tablet ?Commonly known as: BENADRYL ?Take 50 mg by mouth at bedtime. ?  ?doxazosin 4 MG tablet ?Commonly known as: CARDURA ?Take 4 mg by mouth daily. ?  ?folic acid in sodium chloride 0.9 % 50 mL ?Inject into the vein. And B-Complex. ?  ?guaiFENesin 600 MG 12 hr tablet ?Commonly known as: [place] ?Take 1,200 mg by mouth 2 (two) times daily. ?  ?hydrALAZINE 100 MG tablet ?Commonly known as: APRESOLINE ?TAKE 1 TABLET EVERY MORNINGAND AT BEDTIME. ?  ?ipratropium-albuterol 0.5-2.5 (3) MG/3ML Soln ?Commonly known as: DUONEB ?Take 3 mLs by nebulization every 4 (four) hours as needed (Shortness of breath). ?  ?isosorbide mononitrate 30 MG 24 hr tablet ?Commonly known as: IMDUR ?TAKE 1 TABLET DAILY ?  ?lidocaine-prilocaine cream ?Commonly known as: EMLA ?Apply 1 application topically See admin instructions. Apply 1 to 2 hours prior to dialysis on Mondays, Wednesdays, and Fridays. Cover with saran  wrap. ?  ?metoprolol tartrate 100 MG tablet ?Commonly known as: LOPRESSOR ?TAKE 1 TABLET BY MOUTH TWICE A DAY ?  ?omeprazole 20 MG capsule ?Commonly known as: PRILOSEC ?TAKE 1 CAPSULE TWICE DAILY BEFORE A MEAL ?  ?onda

## 2021-05-18 NOTE — Patient Instructions (Addendum)
Please get your shingles,Tetanus and COVID-19 Booster vaccine at your pharmacy ? ?Gastroesophageal Reflux Disease, Adult ?Gastroesophageal reflux (GER) happens when acid from the stomach flows up into the tube that connects the mouth and the stomach (esophagus). Normally, food travels down the esophagus and stays in the stomach to be digested. However, when a person has GER, food and stomach acid sometimes move back up into the esophagus. If this becomes a more serious problem, the person may be diagnosed with a disease called gastroesophageal reflux disease (GERD). GERD occurs when the reflux: ?Happens often. ?Causes frequent or severe symptoms. ?Causes problems such as damage to the esophagus. ?When stomach acid comes in contact with the esophagus, the acid may cause inflammation in the esophagus. Over time, GERD may create small holes (ulcers) in the lining of the esophagus. ?What are the causes? ?This condition is caused by a problem with the muscle between the esophagus and the stomach (lower esophageal sphincter, or LES). Normally, the LES muscle closes after food passes through the esophagus to the stomach. When the LES is weakened or abnormal, it does not close properly, and that allows food and stomach acid to go back up into the esophagus. ?The LES can be weakened by certain dietary substances, medicines, and medical conditions, including: ?Tobacco use. ?Pregnancy. ?Having a hiatal hernia. ?Alcohol use. ?Certain foods and beverages, such as coffee, chocolate, onions, and peppermint. ?What increases the risk? ?You are more likely to develop this condition if you: ?Have an increased body weight. ?Have a connective tissue disorder. ?Take NSAIDs, such as ibuprofen. ?What are the signs or symptoms? ?Symptoms of this condition include: ?Heartburn. ?Difficult or painful swallowing and the feeling of having a lump in the throat. ?A bitter taste in the mouth. ?Bad breath and having a large amount of saliva. ?Having  an upset or bloated stomach and belching. ?Chest pain. Different conditions can cause chest pain. Make sure you see your health care provider if you experience chest pain. ?Shortness of breath or wheezing. ?Ongoing (chronic) cough or a nighttime cough. ?Wearing away of tooth enamel. ?Weight loss. ?How is this diagnosed? ?This condition may be diagnosed based on a medical history and a physical exam. To determine if you have mild or severe GERD, your health care provider may also monitor how you respond to treatment. You may also have tests, including: ?A test to examine your stomach and esophagus with a small camera (endoscopy). ?A test that measures the acidity level in your esophagus. ?A test that measures how much pressure is on your esophagus. ?A barium swallow or modified barium swallow test to show the shape, size, and functioning of your esophagus. ?How is this treated? ?Treatment for this condition may vary depending on how severe your symptoms are. Your health care provider may recommend: ?Changes to your diet. ?Medicine. ?Surgery. ?The goal of treatment is to help relieve your symptoms and to prevent complications. ?Follow these instructions at home: ?Eating and drinking ? ?Follow a diet as recommended by your health care provider. This may involve avoiding foods and drinks such as: ?Coffee and tea, with or without caffeine. ?Drinks that contain alcohol. ?Energy drinks and sports drinks. ?Carbonated drinks or sodas. ?Chocolate and cocoa. ?Peppermint and mint flavorings. ?Garlic and onions. ?Horseradish. ?Spicy and acidic foods, including peppers, chili powder, curry powder, vinegar, hot sauces, and barbecue sauce. ?Citrus fruit juices and citrus fruits, such as oranges, lemons, and limes. ?Tomato-based foods, such as red sauce, chili, salsa, and pizza with red sauce. ?Maceo Pro and  fatty foods, such as donuts, french fries, potato chips, and high-fat dressings. ?High-fat meats, such as hot dogs and fatty cuts  of red and white meats, such as rib eye steak, sausage, ham, and bacon. ?High-fat dairy items, such as whole milk, butter, and cream cheese. ?Eat small, frequent meals instead of large meals. ?Avoid drinking large amounts of liquid with your meals. ?Avoid eating meals during the 2-3 hours before bedtime. ?Avoid lying down right after you eat. ?Do not exercise right after you eat. ?Lifestyle ? ?Do not use any products that contain nicotine or tobacco. These products include cigarettes, chewing tobacco, and vaping devices, such as e-cigarettes. If you need help quitting, ask your health care provider. ?Try to reduce your stress by using methods such as yoga or meditation. If you need help reducing stress, ask your health care provider. ?If you are overweight, reduce your weight to an amount that is healthy for you. Ask your health care provider for guidance about a safe weight loss goal. ?General instructions ?Pay attention to any changes in your symptoms. ?Take over-the-counter and prescription medicines only as told by your health care provider. Do not take aspirin, ibuprofen, or other NSAIDs unless your health care provider told you to take these medicines. ?Wear loose-fitting clothing. Do not wear anything tight around your waist that causes pressure on your abdomen. ?Raise (elevate) the head of your bed about 6 inches (15 cm). You can use a wedge to do this. ?Avoid bending over if this makes your symptoms worse. ?Keep all follow-up visits. This is important. ?Contact a health care provider if: ?You have: ?New symptoms. ?Unexplained weight loss. ?Difficulty swallowing or it hurts to swallow. ?Wheezing or a persistent cough. ?A hoarse voice. ?Your symptoms do not improve with treatment. ?Get help right away if: ?You have sudden pain in your arms, neck, jaw, teeth, or back. ?You suddenly feel sweaty, dizzy, or light-headed. ?You have chest pain or shortness of breath. ?You vomit and the vomit is green, yellow, or  black, or it looks like blood or coffee grounds. ?You faint. ?You have stool that is red, bloody, or black. ?You cannot swallow, drink, or eat. ?These symptoms may represent a serious problem that is an emergency. Do not wait to see if the symptoms will go away. Get medical help right away. Call your local emergency services (911 in the [place] Do not drive yourself to the hospital. ?Summary ?Gastroesophageal reflux happens when acid from the stomach flows up into the esophagus. GERD is a disease in which the reflux happens often, causes frequent or severe symptoms, or causes problems such as damage to the esophagus. ?Treatment for this condition may vary depending on how severe your symptoms are. Your health care provider may recommend diet and lifestyle changes, medicine, or surgery. ?Contact a health care provider if you have new or worsening symptoms. ?Take over-the-counter and prescription medicines only as told by your health care provider. Do not take aspirin, ibuprofen, or other NSAIDs unless your health care provider told you to do so. ?Keep all follow-up visits as told by your health care provider. This is important. ?This information is not intended to replace advice given to you by your health care provider. Make sure you discuss any questions you have with your health care provider. ?Document Revised: 07/20/2019 Document Reviewed: 07/20/2019 ?Elsevier Patient Education ? [employer]. ? ?

## 2021-05-19 DIAGNOSIS — N186 End stage renal disease: Secondary | ICD-10-CM | POA: Diagnosis not present

## 2021-05-19 DIAGNOSIS — N2581 Secondary hyperparathyroidism of renal origin: Secondary | ICD-10-CM | POA: Diagnosis not present

## 2021-05-19 DIAGNOSIS — Z992 Dependence on renal dialysis: Secondary | ICD-10-CM | POA: Diagnosis not present

## 2021-05-19 DIAGNOSIS — D509 Iron deficiency anemia, unspecified: Secondary | ICD-10-CM | POA: Diagnosis not present

## 2021-05-19 DIAGNOSIS — D631 Anemia in chronic kidney disease: Secondary | ICD-10-CM | POA: Diagnosis not present

## 2021-05-22 ENCOUNTER — Other Ambulatory Visit: Payer: Self-pay | Admitting: Family

## 2021-05-22 DIAGNOSIS — D509 Iron deficiency anemia, unspecified: Secondary | ICD-10-CM | POA: Diagnosis not present

## 2021-05-22 DIAGNOSIS — N186 End stage renal disease: Secondary | ICD-10-CM | POA: Diagnosis not present

## 2021-05-22 DIAGNOSIS — D631 Anemia in chronic kidney disease: Secondary | ICD-10-CM | POA: Diagnosis not present

## 2021-05-22 DIAGNOSIS — Z992 Dependence on renal dialysis: Secondary | ICD-10-CM | POA: Diagnosis not present

## 2021-05-22 DIAGNOSIS — E785 Hyperlipidemia, unspecified: Secondary | ICD-10-CM

## 2021-05-22 DIAGNOSIS — T8612 Kidney transplant failure: Secondary | ICD-10-CM | POA: Diagnosis not present

## 2021-05-22 DIAGNOSIS — N2581 Secondary hyperparathyroidism of renal origin: Secondary | ICD-10-CM | POA: Diagnosis not present

## 2021-05-23 ENCOUNTER — Inpatient Hospital Stay: Payer: Medicare Other | Attending: Hematology and Oncology | Admitting: Hematology and Oncology

## 2021-05-23 DIAGNOSIS — D61818 Other pancytopenia: Secondary | ICD-10-CM

## 2021-05-23 NOTE — Assessment & Plan Note (Signed)
03/03/2020: WBC 4.8, hemoglobin 9.8, platelets 84 ?01/11/2021: WBC 3.6, hemoglobin 11.6, platelets 98, creatinine 7.84 ?03/21/2021: WBC 2.5, hemoglobin 8.3, MCV 98.5, platelets 73, creatinine 15.97 ?05/16/2021: WBC 3.1, hemoglobin 11, MCV 100.6, platelets 109, SPEP: No M spike, ANA negative, creatinine 11.65, reticulocyte count absolute 104.8, reticulocyte percentage: 3.1%, erythropoietin 163.8, iron saturation 25%, ferritin 827, folate 7.6, B12 302 ?? ?Patient has multiple chronic illnesses including end-stage kidney disease on hemodialysis, Alport syndrome, anemia of chronic kidney disease, CHF, GI bleed, diabetes ? ?Lab review: Based on the above results there is no clear-cut evidence of nutritional deficiencies or autoimmune processes.  SPEP is also negative.  His pancytopenia has improved compared to the lab results in February. ?There is no immediate need to do a bone marrow biopsy. ? ?We elected to watch and monitor with recheck of labs in 3 months ?

## 2021-05-23 NOTE — Progress Notes (Signed)
HEMATOLOGY-ONCOLOGY TELEPHONE VISIT PROGRESS NOTE ? ?I connected with Kirk [name] on 05/23/21 at  9:30 AM EDT by telephone and verified that I am speaking with the correct person using two identifiers.  ?I discussed the limitations, risks, security and privacy concerns of performing an evaluation and management service by telephone and the availability of in person appointments.  ?I also discussed with the patient that there may be a patient responsible charge related to this service. The patient expressed understanding and agreed to proceed.  ? ?History of Present Illness: Follow-up to discuss results of labs done a couple of weeks ago ? ?REVIEW OF SYSTEMS:   ?Constitutional: Denies fevers, chills or abnormal weight loss ?All other systems were reviewed with the patient and are negative. ?Observations/Objective:  ? ?  ?Assessment Plan:  ?Pancytopenia ([place]) ?03/03/2020: WBC 4.8, hemoglobin 9.8, platelets 84 ?01/11/2021: WBC 3.6, hemoglobin 11.6, platelets 98, creatinine 7.84 ?03/21/2021: WBC 2.5, hemoglobin 8.3, MCV 98.5, platelets 73, creatinine 15.97 ?05/16/2021: WBC 3.1, hemoglobin 11, MCV 100.6, platelets 109, SPEP: No M spike, ANA negative, creatinine 11.65, reticulocyte count absolute 104.8, reticulocyte percentage: 3.1%, erythropoietin 163.8, iron saturation 25%, ferritin 827, folate 7.6, B12 302 ?  ?Patient has multiple chronic illnesses including end-stage kidney disease on hemodialysis, Alport syndrome, anemia of chronic kidney disease, CHF, GI bleed, diabetes ? ?Lab review: Based on the above results there is no clear-cut evidence of nutritional deficiencies or autoimmune processes.  SPEP is also negative.  His pancytopenia has improved compared to the lab results in February. ?There is no immediate need to do a bone marrow biopsy. ? ?We elected to watch and monitor with recheck of labs in 3 months ? ? ?I discussed the assessment and treatment plan with the patient. The patient was provided an opportunity to ask  questions and all were answered. The patient agreed with the plan and demonstrated an understanding of the instructions. The patient was advised to call back or seek an in-person evaluation if the symptoms worsen or if the condition fails to improve as anticipated.  ? ?I provided I have reviewed the above documentation for accuracy and completeness, and I agree with the above. ? minutes of non-face-to-face time during this encounter. Harriette Ohara, MD  ? ?

## 2021-05-24 ENCOUNTER — Telehealth: Payer: Self-pay | Admitting: Hematology and Oncology

## 2021-05-24 DIAGNOSIS — D631 Anemia in chronic kidney disease: Secondary | ICD-10-CM | POA: Diagnosis not present

## 2021-05-24 DIAGNOSIS — D509 Iron deficiency anemia, unspecified: Secondary | ICD-10-CM | POA: Diagnosis not present

## 2021-05-24 DIAGNOSIS — N2581 Secondary hyperparathyroidism of renal origin: Secondary | ICD-10-CM | POA: Diagnosis not present

## 2021-05-24 DIAGNOSIS — Z992 Dependence on renal dialysis: Secondary | ICD-10-CM | POA: Diagnosis not present

## 2021-05-24 DIAGNOSIS — N186 End stage renal disease: Secondary | ICD-10-CM | POA: Diagnosis not present

## 2021-05-24 NOTE — Telephone Encounter (Signed)
Per 5/2 los spoke to pt wife about appointments.  She confirmed appointments  ?

## 2021-05-25 ENCOUNTER — Other Ambulatory Visit: Payer: Medicare Other

## 2021-05-25 ENCOUNTER — Other Ambulatory Visit: Payer: Self-pay | Admitting: *Deleted

## 2021-05-25 DIAGNOSIS — E782 Mixed hyperlipidemia: Secondary | ICD-10-CM

## 2021-05-25 DIAGNOSIS — E1121 Type 2 diabetes mellitus with diabetic nephropathy: Secondary | ICD-10-CM | POA: Diagnosis not present

## 2021-05-25 DIAGNOSIS — R918 Other nonspecific abnormal finding of lung field: Secondary | ICD-10-CM

## 2021-05-26 DIAGNOSIS — Z20822 Contact with and (suspected) exposure to covid-19: Secondary | ICD-10-CM | POA: Diagnosis not present

## 2021-05-26 DIAGNOSIS — D509 Iron deficiency anemia, unspecified: Secondary | ICD-10-CM | POA: Diagnosis not present

## 2021-05-26 DIAGNOSIS — N2581 Secondary hyperparathyroidism of renal origin: Secondary | ICD-10-CM | POA: Diagnosis not present

## 2021-05-26 DIAGNOSIS — N186 End stage renal disease: Secondary | ICD-10-CM | POA: Diagnosis not present

## 2021-05-26 DIAGNOSIS — Z992 Dependence on renal dialysis: Secondary | ICD-10-CM | POA: Diagnosis not present

## 2021-05-26 DIAGNOSIS — D631 Anemia in chronic kidney disease: Secondary | ICD-10-CM | POA: Diagnosis not present

## 2021-05-26 LAB — LIPID PANEL
Cholesterol: 91 mg/dL (ref <200)
HDL: 31 mg/dL — ABNORMAL LOW (ref >=40)
LDL Cholesterol (Calc): 42 mg/dL (calc)
Non-HDL Cholesterol (Calc): 60 mg/dL (calc) (ref <130)
Total CHOL/HDL Ratio: 2.9 (calc) (ref <5.0)
Triglycerides: 95 mg/dL (ref <150)

## 2021-05-26 LAB — HEMOGLOBIN A1C
Hgb A1c MFr Bld: 4.8 % of total Hgb (ref <5.7)
Mean Plasma Glucose: 91 mg/dL
eAG (mmol/L): 5 mmol/L

## 2021-05-29 ENCOUNTER — Other Ambulatory Visit: Payer: Self-pay | Admitting: Family

## 2021-05-29 DIAGNOSIS — N186 End stage renal disease: Secondary | ICD-10-CM | POA: Diagnosis not present

## 2021-05-29 DIAGNOSIS — D509 Iron deficiency anemia, unspecified: Secondary | ICD-10-CM | POA: Diagnosis not present

## 2021-05-29 DIAGNOSIS — F5101 Primary insomnia: Secondary | ICD-10-CM

## 2021-05-29 DIAGNOSIS — D631 Anemia in chronic kidney disease: Secondary | ICD-10-CM | POA: Diagnosis not present

## 2021-05-29 DIAGNOSIS — Z992 Dependence on renal dialysis: Secondary | ICD-10-CM | POA: Diagnosis not present

## 2021-05-29 DIAGNOSIS — N2581 Secondary hyperparathyroidism of renal origin: Secondary | ICD-10-CM | POA: Diagnosis not present

## 2021-05-29 NOTE — Telephone Encounter (Signed)
Patient has request refill on medication Trazodone '50mg'$ . Patient medication hasnt been refill by PCP Ngetich, Dinah C, NP. Medication pend and sent to PCP for approval. Please advise.  ?

## 2021-05-31 DIAGNOSIS — N2581 Secondary hyperparathyroidism of renal origin: Secondary | ICD-10-CM | POA: Diagnosis not present

## 2021-05-31 DIAGNOSIS — N186 End stage renal disease: Secondary | ICD-10-CM | POA: Diagnosis not present

## 2021-05-31 DIAGNOSIS — D509 Iron deficiency anemia, unspecified: Secondary | ICD-10-CM | POA: Diagnosis not present

## 2021-05-31 DIAGNOSIS — Z992 Dependence on renal dialysis: Secondary | ICD-10-CM | POA: Diagnosis not present

## 2021-05-31 DIAGNOSIS — D631 Anemia in chronic kidney disease: Secondary | ICD-10-CM | POA: Diagnosis not present

## 2021-06-02 DIAGNOSIS — D509 Iron deficiency anemia, unspecified: Secondary | ICD-10-CM | POA: Diagnosis not present

## 2021-06-02 DIAGNOSIS — D631 Anemia in chronic kidney disease: Secondary | ICD-10-CM | POA: Diagnosis not present

## 2021-06-02 DIAGNOSIS — N186 End stage renal disease: Secondary | ICD-10-CM | POA: Diagnosis not present

## 2021-06-02 DIAGNOSIS — N2581 Secondary hyperparathyroidism of renal origin: Secondary | ICD-10-CM | POA: Diagnosis not present

## 2021-06-02 DIAGNOSIS — Z992 Dependence on renal dialysis: Secondary | ICD-10-CM | POA: Diagnosis not present

## 2021-06-05 DIAGNOSIS — D631 Anemia in chronic kidney disease: Secondary | ICD-10-CM | POA: Diagnosis not present

## 2021-06-05 DIAGNOSIS — N2581 Secondary hyperparathyroidism of renal origin: Secondary | ICD-10-CM | POA: Diagnosis not present

## 2021-06-05 DIAGNOSIS — Z992 Dependence on renal dialysis: Secondary | ICD-10-CM | POA: Diagnosis not present

## 2021-06-05 DIAGNOSIS — D509 Iron deficiency anemia, unspecified: Secondary | ICD-10-CM | POA: Diagnosis not present

## 2021-06-05 DIAGNOSIS — N186 End stage renal disease: Secondary | ICD-10-CM | POA: Diagnosis not present

## 2021-06-07 DIAGNOSIS — N186 End stage renal disease: Secondary | ICD-10-CM | POA: Diagnosis not present

## 2021-06-07 DIAGNOSIS — D631 Anemia in chronic kidney disease: Secondary | ICD-10-CM | POA: Diagnosis not present

## 2021-06-07 DIAGNOSIS — D509 Iron deficiency anemia, unspecified: Secondary | ICD-10-CM | POA: Diagnosis not present

## 2021-06-07 DIAGNOSIS — N2581 Secondary hyperparathyroidism of renal origin: Secondary | ICD-10-CM | POA: Diagnosis not present

## 2021-06-07 DIAGNOSIS — Z992 Dependence on renal dialysis: Secondary | ICD-10-CM | POA: Diagnosis not present

## 2021-06-09 DIAGNOSIS — N2581 Secondary hyperparathyroidism of renal origin: Secondary | ICD-10-CM | POA: Diagnosis not present

## 2021-06-09 DIAGNOSIS — D631 Anemia in chronic kidney disease: Secondary | ICD-10-CM | POA: Diagnosis not present

## 2021-06-09 DIAGNOSIS — Z992 Dependence on renal dialysis: Secondary | ICD-10-CM | POA: Diagnosis not present

## 2021-06-09 DIAGNOSIS — N186 End stage renal disease: Secondary | ICD-10-CM | POA: Diagnosis not present

## 2021-06-09 DIAGNOSIS — D509 Iron deficiency anemia, unspecified: Secondary | ICD-10-CM | POA: Diagnosis not present

## 2021-06-12 ENCOUNTER — Ambulatory Visit
Admission: RE | Admit: 2021-06-12 | Discharge: 2021-06-12 | Disposition: A | Discharge status: Home / Self Care | Payer: Medicare Other | Source: Ambulatory Visit | Attending: Primary Care | Admitting: Primary Care

## 2021-06-12 DIAGNOSIS — R918 Other nonspecific abnormal finding of lung field: Secondary | ICD-10-CM

## 2021-06-12 DIAGNOSIS — N2581 Secondary hyperparathyroidism of renal origin: Secondary | ICD-10-CM | POA: Diagnosis not present

## 2021-06-12 DIAGNOSIS — N186 End stage renal disease: Secondary | ICD-10-CM | POA: Diagnosis not present

## 2021-06-12 DIAGNOSIS — Z992 Dependence on renal dialysis: Secondary | ICD-10-CM | POA: Diagnosis not present

## 2021-06-12 DIAGNOSIS — D631 Anemia in chronic kidney disease: Secondary | ICD-10-CM | POA: Diagnosis not present

## 2021-06-12 DIAGNOSIS — D509 Iron deficiency anemia, unspecified: Secondary | ICD-10-CM | POA: Diagnosis not present

## 2021-06-12 DIAGNOSIS — R911 Solitary pulmonary nodule: Secondary | ICD-10-CM | POA: Diagnosis not present

## 2021-06-14 ENCOUNTER — Inpatient Hospital Stay: Admission: RE | Admit: 2021-06-14 | Payer: Medicare Other | Source: Ambulatory Visit

## 2021-06-14 DIAGNOSIS — D509 Iron deficiency anemia, unspecified: Secondary | ICD-10-CM | POA: Diagnosis not present

## 2021-06-14 DIAGNOSIS — N186 End stage renal disease: Secondary | ICD-10-CM | POA: Diagnosis not present

## 2021-06-14 DIAGNOSIS — D631 Anemia in chronic kidney disease: Secondary | ICD-10-CM | POA: Diagnosis not present

## 2021-06-14 DIAGNOSIS — Z992 Dependence on renal dialysis: Secondary | ICD-10-CM | POA: Diagnosis not present

## 2021-06-14 DIAGNOSIS — N2581 Secondary hyperparathyroidism of renal origin: Secondary | ICD-10-CM | POA: Diagnosis not present

## 2021-06-15 ENCOUNTER — Encounter: Payer: Self-pay | Admitting: Orthopaedic Surgery

## 2021-06-15 ENCOUNTER — Ambulatory Visit (INDEPENDENT_AMBULATORY_CARE_PROVIDER_SITE_OTHER): Payer: Medicare Other | Admitting: Orthopaedic Surgery

## 2021-06-15 ENCOUNTER — Ambulatory Visit (INDEPENDENT_AMBULATORY_CARE_PROVIDER_SITE_OTHER): Payer: Medicare Other

## 2021-06-15 DIAGNOSIS — G8929 Other chronic pain: Secondary | ICD-10-CM

## 2021-06-15 DIAGNOSIS — M25512 Pain in left shoulder: Secondary | ICD-10-CM
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
## 2021-06-16 DIAGNOSIS — N186 End stage renal disease: Secondary | ICD-10-CM | POA: Diagnosis not present

## 2021-06-16 DIAGNOSIS — Z992 Dependence on renal dialysis: Secondary | ICD-10-CM | POA: Diagnosis not present

## 2021-06-16 DIAGNOSIS — N2581 Secondary hyperparathyroidism of renal origin: Secondary | ICD-10-CM | POA: Diagnosis not present

## 2021-06-16 DIAGNOSIS — D631 Anemia in chronic kidney disease: Secondary | ICD-10-CM | POA: Diagnosis not present

## 2021-06-16 DIAGNOSIS — D509 Iron deficiency anemia, unspecified: Secondary | ICD-10-CM | POA: Diagnosis not present

## 2021-06-17 MED ORDER — METHYLPREDNISOLONE ACETATE 40 MG/ML IJ SUSP
40.0000 mg | INTRAMUSCULAR | Status: AC | PRN
  Administered 2021-06-15: 40 mg via INTRA_ARTICULAR

## 2021-06-17 MED ORDER — LIDOCAINE HCL 2 % IJ SOLN
2.0000 mL | INTRAMUSCULAR | Status: AC | PRN
  Administered 2021-06-15: 2 mL

## 2021-06-17 MED ORDER — BUPIVACAINE HCL 0.25 % IJ SOLN
2.0000 mL | INTRAMUSCULAR | Status: AC | PRN
  Administered 2021-06-15: 2 mL via INTRA_ARTICULAR

## 2021-06-19 DIAGNOSIS — D631 Anemia in chronic kidney disease: Secondary | ICD-10-CM | POA: Diagnosis not present

## 2021-06-19 DIAGNOSIS — N186 End stage renal disease: Secondary | ICD-10-CM | POA: Diagnosis not present

## 2021-06-19 DIAGNOSIS — N2581 Secondary hyperparathyroidism of renal origin: Secondary | ICD-10-CM | POA: Diagnosis not present

## 2021-06-19 DIAGNOSIS — Z992 Dependence on renal dialysis: Secondary | ICD-10-CM | POA: Diagnosis not present

## 2021-06-19 DIAGNOSIS — D509 Iron deficiency anemia, unspecified: Secondary | ICD-10-CM | POA: Diagnosis not present

## 2021-06-21 DIAGNOSIS — N2581 Secondary hyperparathyroidism of renal origin: Secondary | ICD-10-CM | POA: Diagnosis not present

## 2021-06-21 DIAGNOSIS — N186 End stage renal disease: Secondary | ICD-10-CM | POA: Diagnosis not present

## 2021-06-21 DIAGNOSIS — Z992 Dependence on renal dialysis: Secondary | ICD-10-CM | POA: Diagnosis not present

## 2021-06-21 DIAGNOSIS — D509 Iron deficiency anemia, unspecified: Secondary | ICD-10-CM | POA: Diagnosis not present

## 2021-06-21 DIAGNOSIS — D631 Anemia in chronic kidney disease: Secondary | ICD-10-CM | POA: Diagnosis not present

## 2021-06-22 DIAGNOSIS — T8612 Kidney transplant failure: Secondary | ICD-10-CM | POA: Diagnosis not present

## 2021-06-22 DIAGNOSIS — N186 End stage renal disease: Secondary | ICD-10-CM | POA: Diagnosis not present

## 2021-06-22 DIAGNOSIS — Z992 Dependence on renal dialysis: Secondary | ICD-10-CM | POA: Diagnosis not present

## 2021-06-23 DIAGNOSIS — N186 End stage renal disease: Secondary | ICD-10-CM | POA: Diagnosis not present

## 2021-06-23 DIAGNOSIS — N2581 Secondary hyperparathyroidism of renal origin: Secondary | ICD-10-CM | POA: Diagnosis not present

## 2021-06-23 DIAGNOSIS — Z992 Dependence on renal dialysis: Secondary | ICD-10-CM | POA: Diagnosis not present

## 2021-06-23 DIAGNOSIS — D631 Anemia in chronic kidney disease: Secondary | ICD-10-CM | POA: Diagnosis not present

## 2021-06-26 DIAGNOSIS — D631 Anemia in chronic kidney disease: Secondary | ICD-10-CM | POA: Diagnosis not present

## 2021-06-26 DIAGNOSIS — N2581 Secondary hyperparathyroidism of renal origin: Secondary | ICD-10-CM | POA: Diagnosis not present

## 2021-06-26 DIAGNOSIS — Z992 Dependence on renal dialysis: Secondary | ICD-10-CM | POA: Diagnosis not present

## 2021-06-26 DIAGNOSIS — N186 End stage renal disease: Secondary | ICD-10-CM | POA: Diagnosis not present

## 2021-06-27 NOTE — Progress Notes (Signed)
CT chest in May showed no pulmonary consolidation, discrete pulmonary nodule or pulmonary mass. There are scattered centrilobular tree-in-bud nodularity and are more prominent in the lower lobes.  If he has a productive cough should get sputum sample x 3. He has visit in August with Dr. Melvyn Novas   Moderate calcification of aorta and coronary artery. Heart normal size. Should follow up with either PCP or cardiology d.t these non-urgent findings.

## 2021-06-28 ENCOUNTER — Other Ambulatory Visit: Payer: Self-pay | Admitting: *Deleted

## 2021-06-28 DIAGNOSIS — D631 Anemia in chronic kidney disease: Secondary | ICD-10-CM | POA: Diagnosis not present

## 2021-06-28 DIAGNOSIS — N186 End stage renal disease: Secondary | ICD-10-CM | POA: Diagnosis not present

## 2021-06-28 DIAGNOSIS — N2581 Secondary hyperparathyroidism of renal origin: Secondary | ICD-10-CM | POA: Diagnosis not present

## 2021-06-28 DIAGNOSIS — R059 Cough, unspecified: Secondary | ICD-10-CM

## 2021-06-28 DIAGNOSIS — Z992 Dependence on renal dialysis: Secondary | ICD-10-CM | POA: Diagnosis not present

## 2021-06-30 ENCOUNTER — Telehealth: Payer: Self-pay | Admitting: Family

## 2021-06-30 DIAGNOSIS — N2581 Secondary hyperparathyroidism of renal origin: Secondary | ICD-10-CM | POA: Diagnosis not present

## 2021-06-30 DIAGNOSIS — D631 Anemia in chronic kidney disease: Secondary | ICD-10-CM | POA: Diagnosis not present

## 2021-06-30 DIAGNOSIS — Z992 Dependence on renal dialysis: Secondary | ICD-10-CM | POA: Diagnosis not present

## 2021-06-30 DIAGNOSIS — N186 End stage renal disease: Secondary | ICD-10-CM | POA: Diagnosis not present

## 2021-06-30 NOTE — Telephone Encounter (Signed)
This phone call should have been transferred to Clinical Intake to Rodena Piety May/CMA). I'm not a provider and I cannot make referrals for patients. Clinical Intake routes these messages to the appropriate provider and CC's the Medical Assistant if necessary. Message routed to [place].

## 2021-06-30 NOTE — Telephone Encounter (Signed)
Patient wife stated that she will call back on Monday to schedule an appointment to discuss referral when she has her [place].

## 2021-06-30 NOTE — Telephone Encounter (Signed)
Pt spouse requesting new referral for pt to Dr. Ashok Pall at [facility].  Per wife, he had surgery on C6 & C7 on 03/06/21 in [place] but now due to wife having medical issues, pt needs local neurology.

## 2021-07-03 DIAGNOSIS — N186 End stage renal disease: Secondary | ICD-10-CM | POA: Diagnosis not present

## 2021-07-03 DIAGNOSIS — Z992 Dependence on renal dialysis: Secondary | ICD-10-CM | POA: Diagnosis not present

## 2021-07-03 DIAGNOSIS — N2581 Secondary hyperparathyroidism of renal origin: Secondary | ICD-10-CM | POA: Diagnosis not present

## 2021-07-03 DIAGNOSIS — D631 Anemia in chronic kidney disease: Secondary | ICD-10-CM | POA: Diagnosis not present

## 2021-07-04 ENCOUNTER — Ambulatory Visit (INDEPENDENT_AMBULATORY_CARE_PROVIDER_SITE_OTHER): Payer: Medicare Other | Admitting: Family

## 2021-07-04 ENCOUNTER — Encounter: Payer: Self-pay | Admitting: Family

## 2021-07-04 VITALS — BP 140/70 | HR 66 | Temp 97.6°F | Resp 16 | Ht 67.0 in | Wt 205.6 lb

## 2021-07-04 DIAGNOSIS — G8929 Other chronic pain: Secondary | ICD-10-CM

## 2021-07-04 DIAGNOSIS — M5412 Radiculopathy, cervical region: Secondary | ICD-10-CM | POA: Diagnosis not present

## 2021-07-04 DIAGNOSIS — R200 Anesthesia of skin: Secondary | ICD-10-CM

## 2021-07-04 DIAGNOSIS — M545 Low back pain, unspecified: Secondary | ICD-10-CM

## 2021-07-04 DIAGNOSIS — R202 Paresthesia of skin: Secondary | ICD-10-CM

## 2021-07-04 MED ORDER — VITAMIN C 250 MG PO TABS: 250.0000 mg | ORAL_TABLET | Freq: Every day | ORAL | 3 refills | Status: DC

## 2021-07-04 MED ORDER — DOXAZOSIN MESYLATE 4 MG PO TABS: 4.0000 mg | ORAL_TABLET | Freq: Every day | ORAL | 1 refills | Status: DC

## 2021-07-04 NOTE — Progress Notes (Signed)
Provider: Marlowe Sax FNP-C  Jenelle Drennon, Nelda Bucks, NP  Patient Care Team: Shakil Dirk, Nelda Bucks, NP as PCP - General (Family Medicine) [name], MD as PCP - Cardiology (Cardiology) Jovita Kussmaul, MD as Consulting Physician (General Surgery) [name], MD as Consulting Physician (Pulmonary Disease) Estanislado Emms, MD (Inactive) as Consulting Physician (Nephrology) Elam Dutch, MD (Inactive) as Consulting Physician (Vascular Surgery) [name], MD as Consulting Physician (Cardiology) Milus Banister, MD as Attending Physician (Gastroenterology) Jamal Maes, MD as Consulting Physician (Nephrology)  Extended Emergency Contact Information Primary Emergency Contact: [name] Address: [address]          [place], [place] [address] Johnnette Litter of [place] Phone: [phone] Mobile Phone: [phone] Relation: Spouse  Code Status:  DNR Goals of care: Advanced Directive information    07/04/2021    3:29 PM  Advanced Directives  Does Patient Have a Medical Advance Directive? No  Would patient like information on creating a medical advance directive? No - Patient declined     Chief Complaint  Patient presents with   Acute Visit    Patient wants referral to Neurosurgery and spine "Kyle L. Christella Noa, MD"    HPI:  Pt is a 61 y.o. male seen today for an acute visit for referral to another Neurosurgery.Patient here with wife who states recently saw Dr.Kyle L.Cabbell both were impressed with how MD explained everything to them.would like Husband to be referred to [facility] for evaluation of neck,lower back pain and ongoing numbness and tingling on both Upper and lower extremities.  Pain worst both when sitting and prolong standing.  States symptoms have worsen sometimes unable to turn neck.  No loss of bladder or bowel.    Past Medical History:  Diagnosis Date   Acute edema of lung, unspecified    Acute, but ill-defined, cerebrovascular  disease    Allergy    Anemia    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)    Anxiety    Asthma    Asthma    moderate persistent   Carpal tunnel syndrome    Cellulitis and abscess of trunk    Cholelithiasis 07/13/2014   Chronic headaches    Debility, unspecified    Dermatophytosis of the body    Dysrhythmia    history of   Edema    End stage renal disease on dialysis [facility])    "MWF; [facility]" (10/21/2014)   Essential hypertension, benign    GERD (gastroesophageal reflux disease)    Gout, unspecified    HTN (hypertension)    Hypertrophy of prostate without urinary obstruction and other lower urinary tract symptoms (LUTS)    Hypotension, unspecified    Impotence of organic origin    Insomnia, unspecified    Kidney replaced by transplant    Localization-related (focal) (partial) epilepsy and epileptic syndromes with complex partial seizures, without mention of intractable epilepsy    12-15-19- Wife states he has NEVER had a seizure    Lumbago    Memory loss    OSA on CPAP    Other and unspecified hyperlipidemia    controlled /managed per wife    Other chronic nonalcoholic liver disease    Other malaise and fatigue    Other nonspecific abnormal serum enzyme levels    Pain in joint, lower leg    Pain in joint, upper arm    Pneumonia "several times"   Renal dialysis status(V45.11) 02/05/2010   restarted 01/02/13 ofter renal trransplant  failure   Secondary hyperparathyroidism (of renal origin)    Shortness of breath    Sleep apnea    wears cpap    Tension headache    Unspecified constipation    Unspecified essential hypertension    Unspecified hereditary and idiopathic peripheral neuropathy    Unspecified vitamin D deficiency    Past Surgical History:  Procedure Laterality Date   AV FISTULA PLACEMENT Left ?2010   "forearm; at [place]"   [place]  03/21/2011   CHOLECYSTECTOMY N/A 10/21/2014   Procedure:  LAPAROSCOPIC CHOLECYSTECTOMY WITH INTRAOPERATIVE CHOLANGIOGRAM;  Surgeon: Autumn Messing III, MD;  Location: [facility];  Service: General;  Laterality: N/A;   COLONOSCOPY     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   KIDNEY TRANSPLANT  08/17/2011   [place]  10/21/2014   w/IOC   LEFT HEART CATHETERIZATION WITH CORONARY ANGIOGRAM N/A 03/21/2011   Procedure: LEFT HEART CATHETERIZATION WITH CORONARY ANGIOGRAM;  Surgeon: [name], MD;  [facility] C[facility] LAB;  Service: Cardiovascular;  Laterality: N/A;   NEPHRECTOMY  08/2013   removed transplaned kidney   POLYPECTOMY     POSTERIOR FUSION CERVICAL SPINE  06/25/2012   for spinal stenosis   VASECTOMY  2010    Allergies  Allergen Reactions   Codeine Nausea And Vomiting   Hydrocodone-Acetaminophen Nausea And Vomiting    Outpatient Encounter Medications as of 07/04/2021  Medication Sig   acetaminophen (TYLENOL) 500 MG tablet Take 1 tablet (500 mg total) by mouth every 6 (six) hours as needed for moderate pain.   albuterol (PROVENTIL HFA;VENTOLIN HFA) 108 (90 Base) MCG/ACT inhaler Inhale 2 puffs into the lungs every 6 (six) hours as needed for wheezing or shortness of breath.   amLODipine (NORVASC) 10 MG tablet TAKE 1 TABLET BY MOUTH EVERY DAY   aspirin EC 81 MG tablet Take 81 mg by mouth daily.   b complex-vitamin c-folic acid (NEPHRO-VITE) 0.8 MG TABS tablet TAKE 1 TABLET BY MOUTH EVERY DAY   budesonide-formoterol (SYMBICORT) 160-4.5 MCG/ACT inhaler Inhale 2 puffs into the lungs 2 (two) times daily.   buPROPion (WELLBUTRIN XL) 150 MG 24 hr tablet Take by mouth as needed.   calcitRIOL (ROCALTROL) 0.5 MCG capsule Take 2 capsules (1 mcg total) by mouth every Monday, Wednesday, and Friday with hemodialysis.   chlorhexidine (PERIDEX) 0.12 % solution SMARTSIG:By Mouth   Cholecalciferol (VITAMIN D3) 25 MCG (1000 UT) CAPS Take 4,000 Units by mouth daily.   diphenhydrAMINE (BENADRYL) 25 MG tablet Take 50 mg by  mouth at bedtime.   folic acid in sodium chloride 0.9 % 50 mL Inject into the vein. And B-Complex.   guaiFENesin (MUCINEX) 600 MG 12 hr tablet Take 1,200 mg by mouth 2 (two) times daily.   hydrALAZINE (APRESOLINE) 100 MG tablet TAKE 1 TABLET EVERY MORNINGAND AT BEDTIME.   ipratropium-albuterol (DUONEB) 0.5-2.5 (3) MG/3ML SOLN Take 3 mLs by nebulization every 4 (four) hours as needed (Shortness of breath).   isosorbide mononitrate (IMDUR) 30 MG 24 hr tablet TAKE 1 TABLET DAILY   lidocaine-prilocaine (EMLA) cream Apply 1 application topically See admin instructions. Apply 1 to 2 hours prior to dialysis on Mondays, Wednesdays, and Fridays. Cover with saran wrap.   metoprolol tartrate (LOPRESSOR) 100 MG tablet TAKE 1 TABLET BY MOUTH TWICE A DAY   omeprazole (PRILOSEC) 20 MG capsule TAKE 1 CAPSULE TWICE DAILY BEFORE A MEAL   ondansetron (ZOFRAN ODT)  4 MG disintegrating tablet Take 1 tablet (4 mg total) by mouth every 8 (eight) hours as needed for nausea or vomiting.   pravastatin (PRAVACHOL) 40 MG tablet TAKE 1 TABLET BY MOUTH EVERY DAY IN THE EVENING   PRESCRIPTION MEDICATION See admin instructions. CPAP- At bedtime and during any time of rest   RENVELA 800 MG tablet Take 800-2,400 mg by mouth See admin instructions. Take 2,400 mg by mouth three times a day with meals and 800 mg with each snack   traMADol (ULTRAM) 50 MG tablet Take 1 tablet (50 mg total) by mouth daily as needed.   traZODone (DESYREL) 50 MG tablet TAKE 1/2 TO 1 TABLET BY MOUTH AT BEDTIME AS NEEDED FOR SLEEP   [DISCONTINUED] doxazosin (CARDURA) 4 MG tablet Take 4 mg by mouth daily.   [DISCONTINUED] vitamin C (ASCORBIC ACID) 250 MG tablet Take 1 tablet (250 mg total) by mouth daily.   doxazosin (CARDURA) 4 MG tablet Take 1 tablet (4 mg total) by mouth daily.   vitamin C (ASCORBIC ACID) 250 MG tablet Take 1 tablet (250 mg total) by mouth daily.   No facility-administered encounter medications on file as of 07/04/2021.    Review of  Systems  Constitutional:  Negative for appetite change, chills, fatigue, fever and unexpected weight change.  HENT:  Negative for congestion, ear discharge, ear pain, hearing loss, nosebleeds, postnasal drip, rhinorrhea, sinus pressure, sinus pain, sneezing, sore throat, tinnitus and trouble swallowing.   Respiratory:  Negative for cough, chest tightness, shortness of breath and wheezing.   Cardiovascular:  Negative for chest pain, palpitations and leg swelling.  Gastrointestinal:  Negative for abdominal distention, abdominal pain, constipation, diarrhea, nausea and vomiting.  Genitourinary:  Negative for dysuria, flank pain and urgency.       On hemodialysis three times per week   Musculoskeletal:  Positive for arthralgias, back pain and neck pain. Negative for gait problem, joint swelling, myalgias and neck stiffness.  Skin:  Negative for color change, pallor, rash and wound.  Neurological:  Positive for numbness. Negative for dizziness, syncope, speech difficulty, weakness, light-headedness and headaches.    Immunization History  Administered Date(s) Administered   Influenza Whole 11/01/2010   Influenza, Seasonal, Injecte, Preservative Fre 10/29/2012   Influenza-Unspecified 11/21/2011, 11/05/2012, 09/22/2013, 09/22/2017, 11/23/2019, 10/28/2020, 12/04/2020   Moderna Sars-Covid-2 Vaccination 04/06/2019, 05/07/2019, 03/31/2020   PPD Test 12/26/2012   Pneumococcal Polysaccharide-23 11/01/2010, 10/29/2012   Pneumococcal-Unspecified 11/05/2012   Tdap 01/22/2006, 06/08/2021   Pertinent  Health Maintenance Due  Topic Date Due   INFLUENZA VACCINE  08/22/2021   HEMOGLOBIN A1C  11/25/2021   COLONOSCOPY (Pts 45-18yr Insurance coverage will need to be confirmed)  12/29/2026   FOOT EXAM  Discontinued   OPHTHALMOLOGY EXAM  Discontinued      03/21/2021    8:12 AM 03/21/2021    8:16 PM 03/22/2021    4:45 PM 05/18/2021    2:52 PM 07/04/2021    3:29 PM  FSharonin the past year?   0 0 0   Was there an injury with Fall?   0 0 0  Fall Risk Category Calculator   0 0 0  Fall Risk Category   Low Low Low  Patient Fall Risk Level Low fall risk Low fall risk Low fall risk Low fall risk Low fall risk  Patient at Risk for Falls Due to   No Fall Risks No Fall Risks No Fall Risks  Fall risk Follow up   Falls evaluation completed  Falls evaluation completed Falls evaluation completed   Functional Status Survey:    Vitals:   07/04/21 1523  BP: 140/70  Pulse: 66  Resp: 16  Temp: 97.6 F (36.4 C)  SpO2: 96%  Weight: 205 lb 9.6 oz (93.3 kg)  Height: '5\' 7"'$  (1.702 m)   Body mass index is 32.2 kg/m. Physical Exam Vitals reviewed.  Constitutional:      General: He is not in acute distress.    Appearance: Normal appearance. He is obese. He is not ill-appearing or diaphoretic.  HENT:     Head: Normocephalic.     Mouth/Throat:     Mouth: Mucous membranes are moist.     Pharynx: Oropharynx is clear. No oropharyngeal exudate or posterior oropharyngeal erythema.  Eyes:     General: No scleral icterus.       Right eye: No discharge.        Left eye: No discharge.     Conjunctiva/sclera: Conjunctivae normal.     Pupils: Pupils are equal, round, and reactive to light.  Neck:     Vascular: No carotid bruit.     Comments: Limited ROM due to pain  Cardiovascular:     Rate and Rhythm: Normal rate and regular rhythm.     Pulses: Normal pulses.     Heart sounds: Normal heart sounds. No murmur heard.    No friction rub. No gallop.  Pulmonary:     Effort: Pulmonary effort is normal. No respiratory distress.     Breath sounds: Normal breath sounds. No wheezing, rhonchi or rales.  Chest:     Chest wall: No tenderness.  Abdominal:     General: Bowel sounds are normal. There is no distension.     Palpations: Abdomen is soft. There is no mass.     Tenderness: There is no abdominal tenderness. There is no right CVA tenderness, left CVA tenderness, guarding or rebound.  Musculoskeletal:         General: No swelling or tenderness. Normal range of motion.     Cervical back: No rigidity or tenderness.     Right lower leg: No edema.     Left lower leg: No edema.  Lymphadenopathy:     Cervical: No cervical adenopathy.  Skin:    General: Skin is warm and dry.     Coloration: Skin is not pale.     Findings: No bruising, erythema, lesion or rash.  Neurological:     Mental Status: He is alert and oriented to person, place, and time.     Cranial Nerves: No cranial nerve deficit.     Motor: No weakness.     Gait: Gait normal.  Psychiatric:        Mood and Affect: Mood normal.        Speech: Speech normal.        Behavior: Behavior normal.     Labs reviewed: Recent Labs    01/09/21 2251 01/10/21 0345 01/11/21 0345 03/21/21 0818 05/16/21 1611  NA  --  137 135 138 141  K  --  3.6 3.8 4.5 4.3  CL  --  95* 94* 103 100  CO2  --  29 30 21* 30  GLUCOSE  --  132* 115* 102* 126*  BUN  --  18 24* 62* 35*  CREATININE  --  10.25* 13.05* 15.97* 11.65*  CALCIUM  --  8.9 8.7* 9.2 9.9  MG 2.1  --   --   --   --  PHOS  --  4.1  --   --   --    Recent Labs    01/11/21 0345 03/21/21 0818 05/16/21 1611  AST 20 12* 11*  ALT '18 7 9  '$ ALKPHOS 84 55 57  BILITOT 0.8 0.9 0.4  PROT 6.6 6.6 7.1  ALBUMIN 3.4* 3.3* 4.1   Recent Labs    01/09/21 1410 01/10/21 0345 01/11/21 0345 03/21/21 0818 05/16/21 1611  WBC 4.2   < > 3.6* 2.5* 3.1*  NEUTROABS 3.1  --   --  1.5* 1.9  HGB 12.8*   < > 11.6* 8.3* 11.0*  HCT 38.7*   < > 35.4* 26.3* 34.6*  MCV 99.0   < > 100.9* 98.5 100.6*  PLT 118*   < > 98* 73* 109*   < > = values in this interval not displayed.   Lab Results  Component Value Date   TSH 1.38 03/03/2020   Lab Results  Component Value Date   HGBA1C 4.8 05/25/2021   Lab Results  Component Value Date   CHOL 91 05/25/2021   HDL 31 (L) 05/25/2021   LDLCALC 42 05/25/2021   TRIG 95 05/25/2021   CHOLHDL 2.9 05/25/2021    Significant Diagnostic Results in last 30 days:   XR Shoulder Left  Result Date: 06/17/2021 X-rays demonstrate moderate degenerative changes to the [facility] joint  CT Chest Wo Contrast  Result Date: 06/13/2021 CLINICAL DATA:  Lung nodules, multiple EXAM: CT CHEST WITHOUT CONTRAST TECHNIQUE: Multidetector CT imaging of the chest was performed following the standard protocol without IV contrast. RADIATION DOSE REDUCTION: This exam was performed according to the departmental dose-optimization program which includes automated exposure control, adjustment of the mA and/or kV according to patient size and/or use of iterative reconstruction technique. COMPARISON:  May 06, 2020 FINDINGS: Cardiovascular: Moderate atheromatous calcifications of the arch of the aorta and coronary artery calcifications. Normal heart size. No pericardial effusion. Mediastinum/Nodes: No enlarged mediastinal or axillary lymph nodes. Thyroid gland, trachea, and esophagus demonstrate no significant findings. Lungs/Pleura: As before, there are scattered centrilobular tree-in-bud nodularity within the lungs and are more prominent at the lower lobes. No focal consolidation, discrete pulmonary nodule or pulmonary mass seen. No pleural effusion. Upper Abdomen: No acute abnormality.  Status post cholecystectomy. Musculoskeletal: Moderately severe thoracic spondylosis with prominent anterior marginal osteophytes. IMPRESSION: No pulmonary consolidation, discrete pulmonary nodule or pulmonary mass. There are scattered centrilobular tree-in-bud nodularity and are more prominent in the lower lobes. Electronically Signed   By: Frazier Richards M.D.   On: 06/13/2021 11:11    Assessment/Plan 1. Cervical radiculopathy Worsening neck pain with limited ROM turning head.He is status post cervical spinal fusion done at [facility] system by Jenean Lindau 03/06/2021  Patient and spouse request referral to spine Neurosurgery Dr. Ashok Pall for further evaluation of worsening neck pain  -  Ambulatory referral to Neurosurgery  2. Chronic bilateral low back pain without sciatica Chronic  - Ambulatory referral to Neurosurgery  3. Numbness and tingling of both lower extremities Suspect due to cervical pain  - Ambulatory referral to Neurosurgery  Family/ staff Communication: Reviewed plan of care with patient and wife verbalized understanding  Labs/tests ordered: None   Next Appointment: Return if symptoms worsen or fail to improve.    Sandrea Hughs, NP

## 2021-07-05 DIAGNOSIS — N186 End stage renal disease: Secondary | ICD-10-CM | POA: Diagnosis not present

## 2021-07-05 DIAGNOSIS — Z992 Dependence on renal dialysis: Secondary | ICD-10-CM | POA: Diagnosis not present

## 2021-07-05 DIAGNOSIS — D631 Anemia in chronic kidney disease: Secondary | ICD-10-CM | POA: Diagnosis not present

## 2021-07-05 DIAGNOSIS — N2581 Secondary hyperparathyroidism of renal origin: Secondary | ICD-10-CM | POA: Diagnosis not present

## 2021-07-07 DIAGNOSIS — Z992 Dependence on renal dialysis: Secondary | ICD-10-CM | POA: Diagnosis not present

## 2021-07-07 DIAGNOSIS — N186 End stage renal disease: Secondary | ICD-10-CM | POA: Diagnosis not present

## 2021-07-07 DIAGNOSIS — N2581 Secondary hyperparathyroidism of renal origin: Secondary | ICD-10-CM | POA: Diagnosis not present

## 2021-07-07 DIAGNOSIS — D631 Anemia in chronic kidney disease: Secondary | ICD-10-CM | POA: Diagnosis not present

## 2021-07-10 DIAGNOSIS — N186 End stage renal disease: Secondary | ICD-10-CM | POA: Diagnosis not present

## 2021-07-10 DIAGNOSIS — Z992 Dependence on renal dialysis: Secondary | ICD-10-CM | POA: Diagnosis not present

## 2021-07-10 DIAGNOSIS — D631 Anemia in chronic kidney disease: Secondary | ICD-10-CM | POA: Diagnosis not present

## 2021-07-10 DIAGNOSIS — N2581 Secondary hyperparathyroidism of renal origin: Secondary | ICD-10-CM | POA: Diagnosis not present

## 2021-07-11 DIAGNOSIS — H90A31 Mixed conductive and sensorineural hearing loss, unilateral, right ear with restricted hearing on the contralateral side: Secondary | ICD-10-CM | POA: Diagnosis not present

## 2021-07-11 DIAGNOSIS — H7291 Unspecified perforation of tympanic membrane, right ear: Secondary | ICD-10-CM | POA: Diagnosis not present

## 2021-07-11 DIAGNOSIS — Q8781 Alport syndrome: Secondary | ICD-10-CM | POA: Diagnosis not present

## 2021-07-11 DIAGNOSIS — H7011 Chronic mastoiditis, right ear: Secondary | ICD-10-CM | POA: Diagnosis not present

## 2021-07-11 DIAGNOSIS — E878 Other disorders of electrolyte and fluid balance, not elsewhere classified: Secondary | ICD-10-CM | POA: Diagnosis not present

## 2021-07-11 DIAGNOSIS — Z01118 Encounter for examination of ears and hearing with other abnormal findings: Secondary | ICD-10-CM | POA: Diagnosis not present

## 2021-07-11 DIAGNOSIS — H9221 Otorrhagia, right ear: Secondary | ICD-10-CM | POA: Diagnosis not present

## 2021-07-11 DIAGNOSIS — Z974 Presence of external hearing-aid: Secondary | ICD-10-CM | POA: Diagnosis not present

## 2021-07-11 DIAGNOSIS — H9313 Tinnitus, bilateral: Secondary | ICD-10-CM | POA: Diagnosis not present

## 2021-07-11 DIAGNOSIS — H90A22 Sensorineural hearing loss, unilateral, left ear, with restricted hearing on the contralateral side: Secondary | ICD-10-CM | POA: Diagnosis not present

## 2021-07-11 DIAGNOSIS — Z9889 Other specified postprocedural states: Secondary | ICD-10-CM | POA: Diagnosis not present

## 2021-07-11 DIAGNOSIS — H938X2 Other specified disorders of left ear: Secondary | ICD-10-CM | POA: Diagnosis not present

## 2021-07-11 DIAGNOSIS — H9211 Otorrhea, right ear: Secondary | ICD-10-CM | POA: Diagnosis not present

## 2021-07-11 DIAGNOSIS — R42 Dizziness and giddiness: Secondary | ICD-10-CM | POA: Diagnosis not present

## 2021-07-12 DIAGNOSIS — N2581 Secondary hyperparathyroidism of renal origin: Secondary | ICD-10-CM | POA: Diagnosis not present

## 2021-07-12 DIAGNOSIS — Z992 Dependence on renal dialysis: Secondary | ICD-10-CM | POA: Diagnosis not present

## 2021-07-12 DIAGNOSIS — D631 Anemia in chronic kidney disease: Secondary | ICD-10-CM | POA: Diagnosis not present

## 2021-07-12 DIAGNOSIS — N186 End stage renal disease: Secondary | ICD-10-CM | POA: Diagnosis not present

## 2021-07-14 ENCOUNTER — Other Ambulatory Visit: Payer: Medicare Other

## 2021-07-14 DIAGNOSIS — D631 Anemia in chronic kidney disease: Secondary | ICD-10-CM | POA: Diagnosis not present

## 2021-07-14 DIAGNOSIS — N186 End stage renal disease: Secondary | ICD-10-CM | POA: Diagnosis not present

## 2021-07-14 DIAGNOSIS — Z992 Dependence on renal dialysis: Secondary | ICD-10-CM | POA: Diagnosis not present

## 2021-07-14 DIAGNOSIS — N2581 Secondary hyperparathyroidism of renal origin: Secondary | ICD-10-CM | POA: Diagnosis not present

## 2021-07-14 DIAGNOSIS — R059 Cough, unspecified: Secondary | ICD-10-CM

## 2021-07-17 DIAGNOSIS — N2581 Secondary hyperparathyroidism of renal origin: Secondary | ICD-10-CM | POA: Diagnosis not present

## 2021-07-17 DIAGNOSIS — Z992 Dependence on renal dialysis: Secondary | ICD-10-CM | POA: Diagnosis not present

## 2021-07-17 DIAGNOSIS — D631 Anemia in chronic kidney disease: Secondary | ICD-10-CM | POA: Diagnosis not present

## 2021-07-17 DIAGNOSIS — N186 End stage renal disease: Secondary | ICD-10-CM | POA: Diagnosis not present

## 2021-07-18 ENCOUNTER — Telehealth: Payer: Self-pay | Admitting: Orthopaedic Surgery

## 2021-07-18 ENCOUNTER — Other Ambulatory Visit: Payer: Self-pay

## 2021-07-18 DIAGNOSIS — G8929 Other chronic pain: Secondary | ICD-10-CM

## 2021-07-18 NOTE — Telephone Encounter (Signed)
Pt wife called and states that his pain has not gotten any better and she would like to know if you can order and MRI?

## 2021-07-19 DIAGNOSIS — N2581 Secondary hyperparathyroidism of renal origin: Secondary | ICD-10-CM | POA: Diagnosis not present

## 2021-07-19 DIAGNOSIS — Z992 Dependence on renal dialysis: Secondary | ICD-10-CM | POA: Diagnosis not present

## 2021-07-19 DIAGNOSIS — N186 End stage renal disease: Secondary | ICD-10-CM | POA: Diagnosis not present

## 2021-07-19 DIAGNOSIS — D631 Anemia in chronic kidney disease: Secondary | ICD-10-CM | POA: Diagnosis not present

## 2021-07-19 NOTE — Progress Notes (Signed)
Resp sputum grew out normal flora. Fungal culture was negative.

## 2021-07-21 DIAGNOSIS — N2581 Secondary hyperparathyroidism of renal origin: Secondary | ICD-10-CM | POA: Diagnosis not present

## 2021-07-21 DIAGNOSIS — Z992 Dependence on renal dialysis: Secondary | ICD-10-CM | POA: Diagnosis not present

## 2021-07-21 DIAGNOSIS — N186 End stage renal disease: Secondary | ICD-10-CM | POA: Diagnosis not present

## 2021-07-21 DIAGNOSIS — D631 Anemia in chronic kidney disease: Secondary | ICD-10-CM | POA: Diagnosis not present

## 2021-07-22 DIAGNOSIS — Z992 Dependence on renal dialysis: Secondary | ICD-10-CM | POA: Diagnosis not present

## 2021-07-22 DIAGNOSIS — T8612 Kidney transplant failure: Secondary | ICD-10-CM | POA: Diagnosis not present

## 2021-07-22 DIAGNOSIS — N186 End stage renal disease: Secondary | ICD-10-CM | POA: Diagnosis not present

## 2021-07-24 DIAGNOSIS — N186 End stage renal disease: Secondary | ICD-10-CM | POA: Diagnosis not present

## 2021-07-24 DIAGNOSIS — D631 Anemia in chronic kidney disease: Secondary | ICD-10-CM | POA: Diagnosis not present

## 2021-07-24 DIAGNOSIS — Z992 Dependence on renal dialysis: Secondary | ICD-10-CM | POA: Diagnosis not present

## 2021-07-24 DIAGNOSIS — N2581 Secondary hyperparathyroidism of renal origin: Secondary | ICD-10-CM | POA: Diagnosis not present

## 2021-07-26 DIAGNOSIS — N2581 Secondary hyperparathyroidism of renal origin: Secondary | ICD-10-CM | POA: Diagnosis not present

## 2021-07-26 DIAGNOSIS — D631 Anemia in chronic kidney disease: Secondary | ICD-10-CM | POA: Diagnosis not present

## 2021-07-26 DIAGNOSIS — Z992 Dependence on renal dialysis: Secondary | ICD-10-CM | POA: Diagnosis not present

## 2021-07-26 DIAGNOSIS — N186 End stage renal disease: Secondary | ICD-10-CM | POA: Diagnosis not present

## 2021-07-28 ENCOUNTER — Other Ambulatory Visit: Payer: Self-pay | Admitting: *Deleted

## 2021-07-28 DIAGNOSIS — Z992 Dependence on renal dialysis: Secondary | ICD-10-CM | POA: Diagnosis not present

## 2021-07-28 DIAGNOSIS — N2581 Secondary hyperparathyroidism of renal origin: Secondary | ICD-10-CM | POA: Diagnosis not present

## 2021-07-28 DIAGNOSIS — D631 Anemia in chronic kidney disease: Secondary | ICD-10-CM | POA: Diagnosis not present

## 2021-07-28 DIAGNOSIS — N186 End stage renal disease: Secondary | ICD-10-CM | POA: Diagnosis not present

## 2021-07-28 NOTE — Patient Outreach (Signed)
[facility]) Care Management  07/28/2021  Kirk [name] 1961/01/02 [id number]  Telephone outreach for [place] program. Called both listed numbers and left a message on the home #. Requested a return call.  Eulah Pont. Myrtie Neither, MSN, [facility] Gerontological Nurse Practitioner [facility] Care Management [phone]

## 2021-07-31 DIAGNOSIS — N2581 Secondary hyperparathyroidism of renal origin: Secondary | ICD-10-CM | POA: Diagnosis not present

## 2021-07-31 DIAGNOSIS — Z992 Dependence on renal dialysis: Secondary | ICD-10-CM | POA: Diagnosis not present

## 2021-07-31 DIAGNOSIS — N186 End stage renal disease: Secondary | ICD-10-CM | POA: Diagnosis not present

## 2021-07-31 DIAGNOSIS — D631 Anemia in chronic kidney disease: Secondary | ICD-10-CM | POA: Diagnosis not present

## 2021-08-01 ENCOUNTER — Ambulatory Visit (HOSPITAL_COMMUNITY)
Admission: RE | Admit: 2021-08-01 | Discharge: 2021-08-01 | Disposition: A | Discharge status: Home / Self Care | Payer: Medicare Other | Source: Ambulatory Visit | Attending: Primary Care | Admitting: Primary Care

## 2021-08-01 ENCOUNTER — Ambulatory Visit (INDEPENDENT_AMBULATORY_CARE_PROVIDER_SITE_OTHER): Payer: Medicare Other

## 2021-08-01 ENCOUNTER — Ambulatory Visit (HOSPITAL_COMMUNITY): Payer: Medicare Other

## 2021-08-01 ENCOUNTER — Encounter: Payer: Self-pay | Admitting: Primary Care

## 2021-08-01 ENCOUNTER — Ambulatory Visit (INDEPENDENT_AMBULATORY_CARE_PROVIDER_SITE_OTHER): Payer: Medicare Other | Admitting: Primary Care

## 2021-08-01 ENCOUNTER — Telehealth: Payer: Self-pay | Admitting: Primary Care

## 2021-08-01 ENCOUNTER — Other Ambulatory Visit: Payer: Self-pay | Admitting: *Deleted

## 2021-08-01 VITALS — BP 126/80 | HR 70 | Temp 98.5°F | Ht 67.0 in | Wt 202.8 lb

## 2021-08-01 DIAGNOSIS — G4733 Obstructive sleep apnea (adult) (pediatric): Secondary | ICD-10-CM

## 2021-08-01 DIAGNOSIS — I700 Atherosclerosis of aorta: Secondary | ICD-10-CM | POA: Diagnosis not present

## 2021-08-01 DIAGNOSIS — Z9989 Dependence on other enabling machines and devices: Secondary | ICD-10-CM | POA: Diagnosis not present

## 2021-08-01 DIAGNOSIS — R042 Hemoptysis: Secondary | ICD-10-CM | POA: Diagnosis not present

## 2021-08-01 DIAGNOSIS — J4530 Mild persistent asthma, uncomplicated: Secondary | ICD-10-CM | POA: Diagnosis not present

## 2021-08-01 DIAGNOSIS — R918 Other nonspecific abnormal finding of lung field: Secondary | ICD-10-CM | POA: Diagnosis not present

## 2021-08-01 DIAGNOSIS — R079 Chest pain, unspecified: Secondary | ICD-10-CM | POA: Diagnosis not present

## 2021-08-01 DIAGNOSIS — R059 Cough, unspecified: Secondary | ICD-10-CM | POA: Diagnosis not present

## 2021-08-01 DIAGNOSIS — N186 End stage renal disease: Secondary | ICD-10-CM | POA: Diagnosis not present

## 2021-08-01 DIAGNOSIS — J929 Pleural plaque without asbestos: Secondary | ICD-10-CM | POA: Diagnosis not present

## 2021-08-01 LAB — D-DIMER, QUANTITATIVE: D-Dimer, Quant: 0.56 mcg/mL FEU — ABNORMAL HIGH (ref <0.50)

## 2021-08-01 LAB — BASIC METABOLIC PANEL
BUN: 25 mg/dL — ABNORMAL HIGH (ref 6–23)
CO2: 28 mEq/L (ref 19–32)
Calcium: 9.8 mg/dL (ref 8.4–10.5)
Chloride: 102 mEq/L (ref 96–112)
Creatinine, Ser: 8.82 mg/dL (ref 0.40–1.50)
GFR: 6.04 mL/min — CL (ref >60.00)
Glucose, Bld: 119 mg/dL — ABNORMAL HIGH (ref 70–99)
Potassium: 3.7 mEq/L (ref 3.5–5.1)
Sodium: 141 mEq/L (ref 135–145)

## 2021-08-01 LAB — CBC WITH DIFFERENTIAL/PLATELET
Basophils Absolute: 0 10*3/uL (ref 0.0–0.1)
Basophils Relative: 1.6 % (ref 0.0–3.0)
Eosinophils Absolute: 0.1 10*3/uL (ref 0.0–0.7)
Eosinophils Relative: 3.6 % (ref 0.0–5.0)
HCT: 29.2 % — ABNORMAL LOW (ref 39.0–52.0)
Hemoglobin: 9.6 g/dL — ABNORMAL LOW (ref 13.0–17.0)
Lymphocytes Relative: 18.2 % (ref 12.0–46.0)
Lymphs Abs: 0.6 10*3/uL — ABNORMAL LOW (ref 0.7–4.0)
MCHC: 32.7 g/dL (ref 30.0–36.0)
MCV: 95.5 fl (ref 78.0–100.0)
Monocytes Absolute: 0.3 10*3/uL (ref 0.1–1.0)
Monocytes Relative: 11.4 % (ref 3.0–12.0)
Neutro Abs: 2 10*3/uL (ref 1.4–7.7)
Neutrophils Relative %: 65.2 % (ref 43.0–77.0)
Platelets: 91 10*3/uL — ABNORMAL LOW (ref 150.0–400.0)
RBC: 3.06 Mil/uL — ABNORMAL LOW (ref 4.22–5.81)
RDW: 17.7 % — ABNORMAL HIGH (ref 11.5–15.5)
WBC: 3 10*3/uL — ABNORMAL LOW (ref 4.0–10.5)

## 2021-08-01 MED ORDER — BENZONATATE 200 MG PO CAPS: 200.0000 mg | ORAL_CAPSULE | Freq: Three times a day (TID) | ORAL | 1 refills | Status: DC | PRN

## 2021-08-01 MED ORDER — TECHNETIUM TO 99M ALBUMIN AGGREGATED
4.4000 | Freq: Once | INTRAVENOUS | Status: AC
  Administered 2021-08-01: 4.4 via INTRAVENOUS

## 2021-08-01 NOTE — Progress Notes (Signed)
Please let patient know VQ scan was negative for pulmonary embolism.  CT chest showed similar tree-in-bud nodularity.  No evidence of pneumonia.  Sleep seen foreign body on chest x-ray to left chest wall was not visualized on CAT scan.  I will send an antibiotic to treat for bronchitis, he should be able to pick that up later tonight or tomorrow.  Follow-up with Dr. Melvyn Novas in August or sooner if needed

## 2021-08-01 NOTE — Telephone Encounter (Signed)
Received a call report for patient's CXR for today. Radiologist is concerned about the first impression and third impression. I have copied the impressions below:   "IMPRESSION: Subtle reticulonodular opacities within both lung bases, likely corresponding with the tree-in-bud nodularity described on the prior chest CT of 06/12/2021. Findings suggest chronic infection.   Aortic Atherosclerosis (ICD10-I70.0).   16 mm metallic foreign body projecting in the region of the left chest wall, possibly reflecting a fishing lure or an earring. This may be within the patient's clothing. However, clinical correlation is recommended to exclude an embedded foreign body within the chest soft tissues."  Beth, can you please advise? Thanks!

## 2021-08-01 NOTE — Assessment & Plan Note (Addendum)
-   Patient has had a dry cough for the last 3 months, recently became productive with green mucus/ blood-tinged.  He had a CT chest in May 2023 that showed tree-in-bud nodularity, respiratory sputum sample showed no growth and fungal culture was negative.  Recommend repeating sputum sample with AFB.  Checking chest x-ray today along with CBC and D-dimer. Holding off on abx until chest imaging comes back. Advised patient take mucinex-dm '600mg'$  twice daily x 7-10 days. Rx tessalon perles TID for cough.

## 2021-08-01 NOTE — Addendum Note (Signed)
Addended by: Dessie Coma on: 08/01/2021 12:40 PM   Modules accepted: Orders

## 2021-08-01 NOTE — Telephone Encounter (Signed)
Called and spoke with pt and spouse [name] letting them know the results of the cxr and also of the bloodwork. Asked if pt had any metal object on his shirt on the left side or if he had anything embedded in his skin and pt stated that he did not have anything in shirt or on skin. Let both pt and spouse know that we were going to need pt to have a CT to get better imaging from cxr and they verbalized understanding.  Per Eustaquio Maize, due to pt's kidney function, pt will need to have a VQ scan and CT chest without contrast instead of having a stat CTA. Orders have been placed. Nothing further needed.

## 2021-08-01 NOTE — Assessment & Plan Note (Signed)
-   Stable; Asthma symptoms do not appear acutely exacerbated. Lungs were clear on exam, no wheezing - Continue Symbicort 136mg two puffs q 12 hours and prn albuterol hfa 2 puffs every 4-6 hours for breakthrough sob/wheezing

## 2021-08-01 NOTE — Patient Instructions (Addendum)
Recommendation: Symbicort 152mg - take 2 puffs morning and evening Use albuterol every 4-6 hours as needed for breakthrough shortness of breath or wheezing Start Mucinex DM 1 tablet twice daily x7 to 10 days Repeat sputum sample We will be sending in antibiotics for bronchitis once testing has returned  Orders Labs today Chest x-ray today  Rx: Tessalon perles 1 tab every 8 hours as needed for cough   Follow-up: Needs visit in August with Dr. WMelvyn Novas/ or new [facility] pulm MD (30 mins)

## 2021-08-01 NOTE — Telephone Encounter (Signed)
I tried to call patient but mailbox was full. Can we call him and ask about possible metal object in his shirt on the left side. Anything embedded in his skin on the left side? Any foreign body aspiration? If no, he will need stat CTA chest re: hemoptysis
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
## 2021-08-01 NOTE — Assessment & Plan Note (Addendum)
-   OSA managed by Dr. Brett Fairy with neurology. Patient reports compliance with CPAP. Advised he continue to wear nightly for 4-6 hours or longer

## 2021-08-02 DIAGNOSIS — N2581 Secondary hyperparathyroidism of renal origin: Secondary | ICD-10-CM | POA: Diagnosis not present

## 2021-08-02 DIAGNOSIS — D631 Anemia in chronic kidney disease: Secondary | ICD-10-CM | POA: Diagnosis not present

## 2021-08-02 DIAGNOSIS — Z992 Dependence on renal dialysis: Secondary | ICD-10-CM | POA: Diagnosis not present

## 2021-08-02 DIAGNOSIS — N186 End stage renal disease: Secondary | ICD-10-CM | POA: Diagnosis not present

## 2021-08-02 MED ORDER — DOXYCYCLINE HYCLATE 100 MG PO TABS: 100.0000 mg | ORAL_TABLET | Freq: Two times a day (BID) | ORAL | 0 refills | Status: DC

## 2021-08-02 NOTE — Addendum Note (Signed)
Addended by: Martyn Ehrich on: 08/02/2021 08:56 AM   Modules accepted: Orders

## 2021-08-04 DIAGNOSIS — Z992 Dependence on renal dialysis: Secondary | ICD-10-CM | POA: Diagnosis not present

## 2021-08-04 DIAGNOSIS — N186 End stage renal disease: Secondary | ICD-10-CM | POA: Diagnosis not present

## 2021-08-04 DIAGNOSIS — D631 Anemia in chronic kidney disease: Secondary | ICD-10-CM | POA: Diagnosis not present

## 2021-08-04 DIAGNOSIS — N2581 Secondary hyperparathyroidism of renal origin: Secondary | ICD-10-CM | POA: Diagnosis not present

## 2021-08-07 ENCOUNTER — Encounter: Payer: Self-pay | Admitting: *Deleted

## 2021-08-07 DIAGNOSIS — Z992 Dependence on renal dialysis: Secondary | ICD-10-CM | POA: Diagnosis not present

## 2021-08-07 DIAGNOSIS — N2581 Secondary hyperparathyroidism of renal origin: Secondary | ICD-10-CM | POA: Diagnosis not present

## 2021-08-07 DIAGNOSIS — N186 End stage renal disease: Secondary | ICD-10-CM | POA: Diagnosis not present

## 2021-08-07 DIAGNOSIS — D631 Anemia in chronic kidney disease: Secondary | ICD-10-CM | POA: Diagnosis not present

## 2021-08-09 DIAGNOSIS — D631 Anemia in chronic kidney disease: Secondary | ICD-10-CM | POA: Diagnosis not present

## 2021-08-09 DIAGNOSIS — Z992 Dependence on renal dialysis: Secondary | ICD-10-CM | POA: Diagnosis not present

## 2021-08-09 DIAGNOSIS — N2581 Secondary hyperparathyroidism of renal origin: Secondary | ICD-10-CM | POA: Diagnosis not present

## 2021-08-09 DIAGNOSIS — N186 End stage renal disease: Secondary | ICD-10-CM | POA: Diagnosis not present

## 2021-08-11 DIAGNOSIS — Z992 Dependence on renal dialysis: Secondary | ICD-10-CM | POA: Diagnosis not present

## 2021-08-11 DIAGNOSIS — N186 End stage renal disease: Secondary | ICD-10-CM | POA: Diagnosis not present

## 2021-08-11 DIAGNOSIS — D631 Anemia in chronic kidney disease: Secondary | ICD-10-CM | POA: Diagnosis not present

## 2021-08-11 DIAGNOSIS — N2581 Secondary hyperparathyroidism of renal origin: Secondary | ICD-10-CM | POA: Diagnosis not present

## 2021-08-11 LAB — RESPIRATORY CULTURE OR RESPIRATORY AND SPUTUM CULTURE
MICRO NUMBER:: 13564981
RESULT:: NORMAL
SPECIMEN QUALITY:: ADEQUATE

## 2021-08-11 LAB — FUNGUS CULTURE W SMEAR
CULTURE:: NO GROWTH
MICRO NUMBER:: 13564980
SMEAR:: NONE SEEN
SPECIMEN QUALITY:: ADEQUATE

## 2021-08-11 NOTE — Progress Notes (Signed)
Please let patient know sputum sample grew out normal oropharyngeal flora

## 2021-08-11 NOTE — Progress Notes (Signed)
Disregard, already notified

## 2021-08-14 DIAGNOSIS — N186 End stage renal disease: Secondary | ICD-10-CM | POA: Diagnosis not present

## 2021-08-14 DIAGNOSIS — D631 Anemia in chronic kidney disease: Secondary | ICD-10-CM | POA: Diagnosis not present

## 2021-08-14 DIAGNOSIS — Z992 Dependence on renal dialysis: Secondary | ICD-10-CM | POA: Diagnosis not present

## 2021-08-14 DIAGNOSIS — N2581 Secondary hyperparathyroidism of renal origin: Secondary | ICD-10-CM | POA: Diagnosis not present

## 2021-08-16 DIAGNOSIS — N2581 Secondary hyperparathyroidism of renal origin: Secondary | ICD-10-CM | POA: Diagnosis not present

## 2021-08-16 DIAGNOSIS — Z992 Dependence on renal dialysis: Secondary | ICD-10-CM | POA: Diagnosis not present

## 2021-08-16 DIAGNOSIS — N186 End stage renal disease: Secondary | ICD-10-CM | POA: Diagnosis not present

## 2021-08-16 DIAGNOSIS — D631 Anemia in chronic kidney disease: Secondary | ICD-10-CM | POA: Diagnosis not present

## 2021-08-18 NOTE — Progress Notes (Signed)
Patient Care Team: Ngetich, Nelda Bucks, NP as PCP - General (Family Medicine) Debara Pickett Nadean Corwin, MD as PCP - Cardiology (Cardiology) Jovita Kussmaul, MD as Consulting Physician (General Surgery) [name], MD as Consulting Physician (Pulmonary Disease) Estanislado Emms, MD (Inactive) as Consulting Physician (Nephrology) Elam Dutch, MD (Inactive) as Consulting Physician (Vascular Surgery) [name], MD as Consulting Physician (Cardiology) Milus Banister, MD as Attending Physician (Gastroenterology) Jamal Maes, MD as Consulting Physician (Nephrology)  DIAGNOSIS: No diagnosis found.  SUMMARY OF ONCOLOGIC HISTORY: Oncology History   No history exists.    CHIEF COMPLIANT: Follow-up squamous cell carcinoma  INTERVAL HISTORY: Kirk [name] is a 61 y.o with the above mention. He presents to the clinic today for a follow-up.   ALLERGIES:  is allergic to codeine and hydrocodone-acetaminophen.  MEDICATIONS:  Current Outpatient Medications  Medication Sig Dispense Refill   acetaminophen (TYLENOL) 500 MG tablet Take 1 tablet (500 mg total) by mouth every 6 (six) hours as needed for moderate pain.     albuterol (PROVENTIL HFA;VENTOLIN HFA) 108 (90 Base) MCG/ACT inhaler Inhale 2 puffs into the lungs every 6 (six) hours as needed for wheezing or shortness of breath. 1 Inhaler 0   amLODipine (NORVASC) 10 MG tablet TAKE 1 TABLET BY MOUTH EVERY DAY 90 tablet 3   aspirin EC 81 MG tablet Take 81 mg by mouth daily.     b complex-vitamin c-folic acid (NEPHRO-VITE) 0.8 MG TABS tablet TAKE 1 TABLET BY MOUTH EVERY DAY 90 tablet 3   benzonatate (TESSALON) 200 MG capsule Take 1 capsule (200 mg total) by mouth 3 (three) times daily as needed for cough. 30 capsule 1   budesonide-formoterol (SYMBICORT) 160-4.5 MCG/ACT inhaler Inhale 2 puffs into the lungs 2 (two) times daily. 1 Inhaler 0   buPROPion (WELLBUTRIN XL) 150 MG 24 hr tablet Take by mouth as needed.     calcitRIOL (ROCALTROL)  0.5 MCG capsule Take 2 capsules (1 mcg total) by mouth every Monday, Wednesday, and Friday with hemodialysis. 60 capsule 1   chlorhexidine (PERIDEX) 0.12 % solution SMARTSIG:By Mouth     Cholecalciferol (VITAMIN D3) 25 MCG (1000 UT) CAPS Take 4,000 Units by mouth daily.     diphenhydrAMINE (BENADRYL) 25 MG tablet Take 50 mg by mouth at bedtime.     doxazosin (CARDURA) 4 MG tablet Take 1 tablet (4 mg total) by mouth daily. 90 tablet 1   doxycycline (VIBRA-TABS) 100 MG tablet Take 1 tablet (100 mg total) by mouth 2 (two) times daily. 14 tablet 0   folic acid in sodium chloride 0.9 % 50 mL Inject into the vein. And B-Complex.     guaiFENesin (MUCINEX) 600 MG 12 hr tablet Take 1,200 mg by mouth 2 (two) times daily.     hydrALAZINE (APRESOLINE) 100 MG tablet TAKE 1 TABLET EVERY MORNINGAND AT BEDTIME. 180 tablet 3   ipratropium-albuterol (DUONEB) 0.5-2.5 (3) MG/3ML SOLN Take 3 mLs by nebulization every 4 (four) hours as needed (Shortness of breath). 360 mL 0   isosorbide mononitrate (IMDUR) 30 MG 24 hr tablet TAKE 1 TABLET DAILY 90 tablet 1   lidocaine-prilocaine (EMLA) cream Apply 1 application topically See admin instructions. Apply 1 to 2 hours prior to dialysis on Mondays, Wednesdays, and Fridays. Cover with saran wrap.     metoprolol tartrate (LOPRESSOR) 100 MG tablet TAKE 1 TABLET BY MOUTH TWICE A DAY 180 tablet 1   omeprazole (PRILOSEC) 20 MG capsule TAKE 1 CAPSULE TWICE DAILY BEFORE A  MEAL 180 capsule 3   ondansetron (ZOFRAN ODT) 4 MG disintegrating tablet Take 1 tablet (4 mg total) by mouth every 8 (eight) hours as needed for nausea or vomiting. 14 tablet 0   pravastatin (PRAVACHOL) 40 MG tablet TAKE 1 TABLET BY MOUTH EVERY DAY IN THE EVENING 90 tablet 2   PRESCRIPTION MEDICATION See admin instructions. CPAP- At bedtime and during any time of rest     RENVELA 800 MG tablet Take 800-2,400 mg by mouth See admin instructions. Take 2,400 mg by mouth three times a day with meals and 800 mg with each  snack     traMADol (ULTRAM) 50 MG tablet Take 1 tablet (50 mg total) by mouth daily as needed. 30 tablet 0   traZODone (DESYREL) 50 MG tablet TAKE 1/2 TO 1 TABLET BY MOUTH AT BEDTIME AS NEEDED FOR SLEEP 90 tablet 1   vitamin C (ASCORBIC ACID) 250 MG tablet Take 1 tablet (250 mg total) by mouth daily. 90 tablet 3   No current facility-administered medications for this visit.    PHYSICAL EXAMINATION: ECOG PERFORMANCE STATUS: {CHL ONC ECOG PS:[phone]}  There were no vitals filed for this visit. There were no vitals filed for this visit.  BREAST:*** No palpable masses or nodules in either right or left breasts. No palpable axillary supraclavicular or infraclavicular adenopathy no breast tenderness or nipple discharge. (exam performed in the presence of a chaperone)  LABORATORY DATA:  I have reviewed the data as listed    Latest Ref Rng & Units 08/01/2021   10:35 AM 05/16/2021    4:11 PM 03/21/2021    8:18 AM  CMP  Glucose 70 - 99 mg/dL 119  126  102   BUN 6 - 23 mg/dL 25  35  62   Creatinine 0.40 - 1.50 mg/dL 8.82  11.65  15.97   Sodium 135 - 145 mEq/L 141  141  138   Potassium 3.5 - 5.1 mEq/L 3.7  4.3  4.5   Chloride 96 - 112 mEq/L 102  100  103   CO2 19 - 32 mEq/L '28  30  21   '$ Calcium 8.4 - 10.5 mg/dL 9.8  9.9  9.2   Total Protein 6.5 - 8.1 g/dL  7.1  6.6   Total Bilirubin 0.3 - 1.2 mg/dL  0.4  0.9   Alkaline Phos 38 - 126 U/L  57  55   AST 15 - 41 U/L  11  12   ALT 0 - 44 U/L  9  7     Lab Results  Component Value Date   WBC 3.0 (L) 08/01/2021   HGB 9.6 (L) 08/01/2021   HCT 29.2 (L) 08/01/2021   MCV 95.5 08/01/2021   PLT 91.0 (L) 08/01/2021   NEUTROABS 2.0 08/01/2021    ASSESSMENT & PLAN:  No problem-specific Assessment & Plan notes found for this encounter.    No orders of the defined types were placed in this encounter.  The patient has a good understanding of the overall plan. he agrees with it. he will call with any problems that may develop before the next  visit here. Total time spent: 30 mins including face to face time and time spent for planning, charting and co-ordination of care   Suzzette Righter, [place] 08/18/21    I Gardiner Coins am scribing for Dr. Lindi Adie  ***

## 2021-08-19 DIAGNOSIS — N2581 Secondary hyperparathyroidism of renal origin: Secondary | ICD-10-CM | POA: Diagnosis not present

## 2021-08-19 DIAGNOSIS — D631 Anemia in chronic kidney disease: Secondary | ICD-10-CM | POA: Diagnosis not present

## 2021-08-19 DIAGNOSIS — N186 End stage renal disease: Secondary | ICD-10-CM | POA: Diagnosis not present

## 2021-08-19 DIAGNOSIS — Z992 Dependence on renal dialysis: Secondary | ICD-10-CM | POA: Diagnosis not present

## 2021-08-21 DIAGNOSIS — M4712 Other spondylosis with myelopathy, cervical region: Secondary | ICD-10-CM | POA: Diagnosis not present

## 2021-08-21 DIAGNOSIS — M5441 Lumbago with sciatica, right side: Secondary | ICD-10-CM | POA: Diagnosis not present

## 2021-08-21 DIAGNOSIS — D631 Anemia in chronic kidney disease: Secondary | ICD-10-CM | POA: Diagnosis not present

## 2021-08-21 DIAGNOSIS — N2581 Secondary hyperparathyroidism of renal origin: Secondary | ICD-10-CM | POA: Diagnosis not present

## 2021-08-21 DIAGNOSIS — Z6831 Body mass index (BMI) 31.0-31.9, adult: Secondary | ICD-10-CM | POA: Diagnosis not present

## 2021-08-21 DIAGNOSIS — N186 End stage renal disease: Secondary | ICD-10-CM | POA: Diagnosis not present

## 2021-08-21 DIAGNOSIS — Z992 Dependence on renal dialysis: Secondary | ICD-10-CM | POA: Diagnosis not present

## 2021-08-22 ENCOUNTER — Other Ambulatory Visit: Payer: Self-pay | Admitting: Internal Medicine

## 2021-08-22 DIAGNOSIS — N186 End stage renal disease: Secondary | ICD-10-CM | POA: Diagnosis not present

## 2021-08-22 DIAGNOSIS — Z992 Dependence on renal dialysis: Secondary | ICD-10-CM | POA: Diagnosis not present

## 2021-08-22 DIAGNOSIS — T8612 Kidney transplant failure: Secondary | ICD-10-CM | POA: Diagnosis not present

## 2021-08-23 DIAGNOSIS — D509 Iron deficiency anemia, unspecified: Secondary | ICD-10-CM | POA: Diagnosis not present

## 2021-08-23 DIAGNOSIS — Z992 Dependence on renal dialysis: Secondary | ICD-10-CM | POA: Diagnosis not present

## 2021-08-23 DIAGNOSIS — N186 End stage renal disease: Secondary | ICD-10-CM | POA: Diagnosis not present

## 2021-08-23 DIAGNOSIS — D631 Anemia in chronic kidney disease: Secondary | ICD-10-CM | POA: Diagnosis not present

## 2021-08-23 DIAGNOSIS — M4712 Other spondylosis with myelopathy, cervical region: Secondary | ICD-10-CM | POA: Diagnosis not present

## 2021-08-23 DIAGNOSIS — M545 Low back pain, unspecified: Secondary | ICD-10-CM | POA: Diagnosis not present

## 2021-08-23 DIAGNOSIS — M5441 Lumbago with sciatica, right side: Secondary | ICD-10-CM | POA: Diagnosis not present

## 2021-08-23 DIAGNOSIS — M47812 Spondylosis without myelopathy or radiculopathy, cervical region: Secondary | ICD-10-CM | POA: Diagnosis not present

## 2021-08-23 DIAGNOSIS — N2581 Secondary hyperparathyroidism of renal origin: Secondary | ICD-10-CM | POA: Diagnosis not present

## 2021-08-24 ENCOUNTER — Inpatient Hospital Stay: Payer: Medicare Other

## 2021-08-24 ENCOUNTER — Inpatient Hospital Stay: Payer: Medicare Other | Attending: Hematology and Oncology | Admitting: Hematology and Oncology

## 2021-08-24 ENCOUNTER — Other Ambulatory Visit: Payer: Self-pay

## 2021-08-24 DIAGNOSIS — N186 End stage renal disease: Secondary | ICD-10-CM | POA: Diagnosis not present

## 2021-08-24 DIAGNOSIS — M549 Dorsalgia, unspecified: Secondary | ICD-10-CM | POA: Diagnosis not present

## 2021-08-24 DIAGNOSIS — D61818 Other pancytopenia: Secondary | ICD-10-CM | POA: Insufficient documentation

## 2021-08-24 DIAGNOSIS — Z992 Dependence on renal dialysis: Secondary | ICD-10-CM | POA: Insufficient documentation

## 2021-08-24 LAB — CBC WITH DIFFERENTIAL (CANCER CENTER ONLY)
Abs Immature Granulocytes: 0.02 10*3/uL (ref 0.00–0.07)
Basophils Absolute: 0.1 10*3/uL (ref 0.0–0.1)
Basophils Relative: 1 %
Eosinophils Absolute: 0.1 10*3/uL (ref 0.0–0.5)
Eosinophils Relative: 2 %
HCT: 30.1 % — ABNORMAL LOW (ref 39.0–52.0)
Hemoglobin: 10 g/dL — ABNORMAL LOW (ref 13.0–17.0)
Immature Granulocytes: 0 %
Lymphocytes Relative: 18 %
Lymphs Abs: 0.8 10*3/uL (ref 0.7–4.0)
MCH: 31.6 pg (ref 26.0–34.0)
MCHC: 33.2 g/dL (ref 30.0–36.0)
MCV: 95.3 fL (ref 80.0–100.0)
Monocytes Absolute: 0.4 10*3/uL (ref 0.1–1.0)
Monocytes Relative: 9 %
Neutro Abs: 3.1 10*3/uL (ref 1.7–7.7)
Neutrophils Relative %: 70 %
Platelet Count: 86 10*3/uL — ABNORMAL LOW (ref 150–400)
RBC: 3.16 MIL/uL — ABNORMAL LOW (ref 4.22–5.81)
RDW: 14.9 % (ref 11.5–15.5)
WBC Count: 4.5 10*3/uL (ref 4.0–10.5)
nRBC: 0 % (ref 0.0–0.2)

## 2021-08-24 NOTE — Assessment & Plan Note (Signed)
03/03/2020: WBC 4.8, hemoglobin 9.8, platelets 84 01/11/2021: WBC 3.6, hemoglobin 11.6, platelets 98, creatinine 7.84 03/21/2021: WBC 2.5, hemoglobin 8.3, MCV 98.5, platelets 73, creatinine 15.97 05/16/2021: WBC 3.1, hemoglobin 11, MCV 100.6, platelets 109, SPEP: No M spike, ANA negative, creatinine 11.65, reticulocyte count absolute 104.8, reticulocyte percentage: 3.1%, erythropoietin 163.8, iron saturation 25%, ferritin 827, folate 7.6, B12 302 08/01/2021: RBC 3, hemoglobin 9.6, platelets 91  Patient has multiple chronic illnesses including end-stage kidney disease on hemodialysis, Al[place] syndrome, anemia of chronic kidney disease, CHF, GI bleed, diabetes  We discussed the role of bone marrow biopsy in getting further answers however strongly believe that the pancytopenia is related to chronic illness.  Return to clinic in 1 year for follow-up

## 2021-08-25 DIAGNOSIS — D631 Anemia in chronic kidney disease: Secondary | ICD-10-CM | POA: Diagnosis not present

## 2021-08-25 DIAGNOSIS — N186 End stage renal disease: Secondary | ICD-10-CM | POA: Diagnosis not present

## 2021-08-25 DIAGNOSIS — N2581 Secondary hyperparathyroidism of renal origin: Secondary | ICD-10-CM | POA: Diagnosis not present

## 2021-08-25 DIAGNOSIS — Z992 Dependence on renal dialysis: Secondary | ICD-10-CM | POA: Diagnosis not present

## 2021-08-25 DIAGNOSIS — D509 Iron deficiency anemia, unspecified: Secondary | ICD-10-CM | POA: Diagnosis not present

## 2021-08-26 ENCOUNTER — Ambulatory Visit
Admission: RE | Admit: 2021-08-26 | Discharge: 2021-08-26 | Disposition: A | Discharge status: Home / Self Care | Payer: Medicare Other | Source: Ambulatory Visit | Attending: Orthopaedic Surgery | Admitting: Orthopaedic Surgery

## 2021-08-26 DIAGNOSIS — G8929 Other chronic pain: Secondary | ICD-10-CM

## 2021-08-28 DIAGNOSIS — Z992 Dependence on renal dialysis: Secondary | ICD-10-CM | POA: Diagnosis not present

## 2021-08-28 DIAGNOSIS — N186 End stage renal disease: Secondary | ICD-10-CM | POA: Diagnosis not present

## 2021-08-28 DIAGNOSIS — M792 Neuralgia and neuritis, unspecified: Secondary | ICD-10-CM | POA: Diagnosis not present

## 2021-08-28 DIAGNOSIS — N2581 Secondary hyperparathyroidism of renal origin: Secondary | ICD-10-CM | POA: Diagnosis not present

## 2021-08-28 DIAGNOSIS — D631 Anemia in chronic kidney disease: Secondary | ICD-10-CM | POA: Diagnosis not present

## 2021-08-28 DIAGNOSIS — G8929 Other chronic pain: Secondary | ICD-10-CM | POA: Diagnosis not present

## 2021-08-28 DIAGNOSIS — D509 Iron deficiency anemia, unspecified: Secondary | ICD-10-CM | POA: Diagnosis not present

## 2021-08-28 DIAGNOSIS — M4712 Other spondylosis with myelopathy, cervical region: Secondary | ICD-10-CM | POA: Diagnosis not present

## 2021-08-29 ENCOUNTER — Encounter: Payer: Self-pay | Admitting: Internal Medicine

## 2021-08-29 ENCOUNTER — Other Ambulatory Visit: Payer: Self-pay | Admitting: Physician Assistant

## 2021-08-29 ENCOUNTER — Other Ambulatory Visit: Payer: Self-pay | Admitting: *Deleted

## 2021-08-29 ENCOUNTER — Telehealth: Payer: Self-pay | Admitting: Orthopaedic Surgery

## 2021-08-29 ENCOUNTER — Ambulatory Visit (INDEPENDENT_AMBULATORY_CARE_PROVIDER_SITE_OTHER): Payer: Medicare Other | Admitting: Internal Medicine

## 2021-08-29 DIAGNOSIS — H7011 Chronic mastoiditis, right ear: Secondary | ICD-10-CM | POA: Diagnosis not present

## 2021-08-29 DIAGNOSIS — H90A31 Mixed conductive and sensorineural hearing loss, unilateral, right ear with restricted hearing on the contralateral side: Secondary | ICD-10-CM | POA: Diagnosis not present

## 2021-08-29 DIAGNOSIS — J4530 Mild persistent asthma, uncomplicated: Secondary | ICD-10-CM

## 2021-08-29 DIAGNOSIS — H9313 Tinnitus, bilateral: Secondary | ICD-10-CM | POA: Diagnosis not present

## 2021-08-29 DIAGNOSIS — Q8781 Alport syndrome: Secondary | ICD-10-CM | POA: Diagnosis not present

## 2021-08-29 DIAGNOSIS — H9211 Otorrhea, right ear: Secondary | ICD-10-CM | POA: Diagnosis not present

## 2021-08-29 DIAGNOSIS — H7291 Unspecified perforation of tympanic membrane, right ear: Secondary | ICD-10-CM | POA: Diagnosis not present

## 2021-08-29 DIAGNOSIS — Z9049 Acquired absence of other specified parts of digestive tract: Secondary | ICD-10-CM | POA: Diagnosis not present

## 2021-08-29 DIAGNOSIS — R42 Dizziness and giddiness: Secondary | ICD-10-CM | POA: Diagnosis not present

## 2021-08-29 DIAGNOSIS — H9042 Sensorineural hearing loss, unilateral, left ear, with unrestricted hearing on the contralateral side: Secondary | ICD-10-CM | POA: Diagnosis not present

## 2021-08-29 DIAGNOSIS — F458 Other somatoform disorders: Secondary | ICD-10-CM | POA: Diagnosis not present

## 2021-08-29 DIAGNOSIS — Z974 Presence of external hearing-aid: Secondary | ICD-10-CM | POA: Diagnosis not present

## 2021-08-29 MED ORDER — DIAZEPAM 2 MG PO TABS: ORAL_TABLET | ORAL | 0 refills | Status: DC

## 2021-08-29 NOTE — Telephone Encounter (Signed)
Patient's wife Remo Lipps called advised patient could not have the MRI and don't know why. Remo Lipps asked if patient could get something to calm him during the MRI.    Patient uses [facility]  [place]. The number to contact Remo Lipps is [phone]

## 2021-08-29 NOTE — Patient Instructions (Addendum)
Ok to leave off symbicort as long as breathing is same and don't need more albuterol nebulizer than usual   See me after your ENT evaluation if not satisfied   GERD (REFLUX)  is an extremely common cause of respiratory symptoms just like yours , many times with no obvious heartburn at all.    It can be treated with medication, but also with lifestyle changes including elevation of the head of your bed (ideally with 6 -8inch blocks under the headboard of your bed),  Smoking cessation, avoidance of late meals, excessive alcohol, and avoid fatty foods, chocolate, peppermint, colas, red wine, and acidic juices such as orange juice.  NO MINT OR MENTHOL PRODUCTS SO NO COUGH DROPS  USE SUGARLESS CANDY INSTEAD (Jolley ranchers or Stover's or Life Savers) or even ice chips will also do - the key is to swallow to prevent all throat clearing. NO OIL BASED VITAMINS - use powdered substitutes.  Avoid fish oil when coughing.   Follow up is as needed

## 2021-08-29 NOTE — Progress Notes (Signed)
Subjective:   Patient ID: Kirk [name], male    DOB: Jun 16, 1960     MRN: [id number]    Brief patient profile:  61  yobm quit smoking 02/2020  referred 10/18/2014 to [name] for eval of ? Copd for clearance for lap chole  with  no airflow obst on spiromtery   10/18/2014 on HD since 2012    History of Present Illness  10/18/2014 1st [facility] office visit/ [name]   Chief Complaint  Patient presents with   Advice Only    refer Dr. Nyoka Cowden. Denies any wheezing, no chest tx, no cough. Pt here to be evaluated for COPD.    variable sob with exertion - says can walk fine but no regular walking,  Some sob steps   rec Plan A = Backup=  symbiocrt 80 Take 2 puffs first thing in am and then another 2 puffs about 12 hours later.  Plan B = Back up Only use your combivent  as a rescue medication  Plan C = crisis Only use your nebulizer if you try B first and doesn't work You are cleared for surgery  > tol lap chole fine    09/30/2017  f/u ov/[name] re: asthma/ smoker/ f/u [facility] pna Chief Complaint  Patient presents with   Follow-up    Pt c/o increased cough with white sputum, SOB, nasal congestion, runny nose x 2 wks.   Dyspnea:  Walked at [employer] using rollator 2 d prior to OV   Cough: white esp hs  Sleeping: not needing saba no elevate  SABA use: more need over the last 2 weeks 2-3 days/ neb x 2  02: just at hd   rec Plan A = Automatic = symbicort increase to 160 Take 2 puffs first thing in am and then another 2 puffs about 12 hours later.  Work on Char[employer] fundraiser B = Backup Only use your albuterol (ventolin)  as a rescue medication Plan C = Crisis - only use your albuterol nebulizer if you first try Plan B and it fails to help > ok to use the nebulizer up to every 4 hours but if start needing it regularly call for immediate appointment The key is to stop smoking completely before smoking completely stops you!      01/08/2019  f/u ov/[name] re: AB / symb 160  2bid  Chief Complaint  Patient presents with   Follow-up    Breathing is doing well today and no new co's.    Dyspnea:  Walking at mall x 65mn-20 min avg pace x up to sev times a week Cough: none  Sleeping: able to lie in flat bed  SABA use: rarely use saba / no neb  02: no  No change breathing Sunday pm vs any other day - HD  MWF rec Plan A = Automatic = Always=    Symbicort 160 Take 2 puffs first thing in am and then another 2 puffs about 12 hours later.  Work on inhaler technique:  think  of a golfer warming up before you use the inhaler  Plan B = Backup (to supplement plan A, not to replace it) Only use your albuterol (ventolin and proventil) inhaler as a rescue medication Plan C = Crisis (instead of Plan B but only if Plan B stops working) - only use your albuterol nebulizer if you first try Plan B      08/25/2020  f/u ov/[name] re: AB  maint on symb  160 2bid  and stopped smoking  Chief Complaint  Patient presents with   Follow-up    F/U asthma    Dyspnea:  walking 20 min s stopping some hills Cough: none  Sleeping: cpap/ [facility]  SABA use: not needing  02: none Covid status:   vax x 3  Rec No change in medications although if doing great you can take symbicort more as needed up to 2 puffs every 12 hours  We will contact you in April 2023 for the lung cancer screening program Please schedule a follow up visit in 12 months but call sooner if needed   Mar 02 2021 eval  by Volanda Napoleon NP on symbicort   Feb 13 23 neck [facility]gery at [facility] then later w/in weeks onset of globus and stopped symbicort cause wasn't helping     08/29/2021  f/u ov/[name] re: AB maint off cigs and maint on prn saba  Chief Complaint  Patient presents with   Follow-up    Since LOV had neck surgery in Feb. Still having SOB with exertion and still having cough with sputum.   Dyspnea:  tired > sob  Cough: white mucus esp at hs / globus sensation since nec surgery  Sleeping: lots of pillows seems to help SABA  use: once a day ? If helps  02: none  Covid status:  vax x 2  On HD    No obvious day to day or daytime variability or assoc  purulent sputum or mucus plugs or hemoptysis or cp or chest tightness, subjective wheeze or overt sinus or hb symptoms.    Also denies any obvious fluctuation of symptoms with weather or environmental changes or other aggravating or alleviating factors except as outlined above   No unusual exposure hx or h/o childhood pna/ asthma or knowledge of premature birth.  Current Allergies, Complete Past Medical History, Past Surgical History, Family History, and Social History were reviewed in [employer] record.  ROS  The following are not active complaints unless bolded Hoarseness, sore throat, dysphagia, dental problems, itching, sneezing,  nasal congestion or discharge of excess mucus or purulent secretions, ear ache,   fever, chills, sweats, unintended wt loss or wt gain, classically pleuritic or exertional cp,  orthopnea pnd or arm/hand swelling  or leg swelling, presyncope, palpitations, abdominal pain, anorexia, nausea, vomiting, diarrhea  or change in bowel habits or change in bladder habits, change in stools or change in urine, dysuria, hematuria,  rash, arthralgias, visual complaints, headache, numbness, weakness or ataxia or problems with walking or coordination,  change in mood or  memory.        Current Meds  Medication Sig   acetaminophen (TYLENOL) 500 MG tablet Take 1 tablet (500 mg total) by mouth every 6 (six) hours as needed for moderate pain.   albuterol (PROVENTIL HFA;VENTOLIN HFA) 108 (90 Base) MCG/ACT inhaler Inhale 2 puffs into the lungs every 6 (six) hours as needed for wheezing or shortness of breath.   amLODipine (NORVASC) 10 MG tablet TAKE 1 TABLET BY MOUTH EVERY DAY   aspirin EC 81 MG tablet Take 81 mg by mouth daily.   b complex-vitamin c-folic acid (NEPHRO-VITE) 0.8 MG TABS tablet TAKE 1 TABLET BY MOUTH EVERY DAY    budesonide-formoterol (SYMBICORT) 160-4.5 MCG/ACT inhaler Inhale 2 puffs into the lungs 2 (two) times daily.   buPROPion (WELLBUTRIN XL) 150 MG 24 hr tablet Take by mouth as needed.   calcitRIOL (ROCALTROL) 0.5 MCG capsule Take 2 capsules (1  mcg total) by mouth every Monday, Wednesday, and Friday with hemodialysis.   Cholecalciferol (VITAMIN D3) 25 MCG (1000 UT) CAPS Take 4,000 Units by mouth daily.   diphenhydrAMINE (BENADRYL) 25 MG tablet Take 50 mg by mouth at bedtime.   doxazosin (CARDURA) 4 MG tablet Take 1 tablet (4 mg total) by mouth daily.   doxycycline (VIBRA-TABS) 100 MG tablet Take 1 tablet (100 mg total) by mouth 2 (two) times daily.   ethyl chloride spray Apply topically as needed.   folic acid in sodium chloride 0.9 % 50 mL Inject into the vein. And B-Complex.   guaiFENesin (MUCINEX) 600 MG 12 hr tablet Take 1,200 mg by mouth 2 (two) times daily.   hydrALAZINE (APRESOLINE) 100 MG tablet TAKE 1 TABLET EVERY MORNINGAND AT BEDTIME.   ipratropium-albuterol (DUONEB) 0.5-2.5 (3) MG/3ML SOLN Take 3 mLs by nebulization every 4 (four) hours as needed (Shortness of breath).   isosorbide mononitrate (IMDUR) 30 MG 24 hr tablet TAKE 1 TABLET DAILY   lidocaine-prilocaine (EMLA) cream Apply 1 application topically See admin instructions. Apply 1 to 2 hours prior to dialysis on Mondays, Wednesdays, and Fridays. Cover with saran wrap.   metoprolol tartrate (LOPRESSOR) 100 MG tablet TAKE 1 TABLET BY MOUTH TWICE A DAY   omeprazole (PRILOSEC) 20 MG capsule TAKE 1 CAPSULE TWICE DAILY BEFORE A MEAL   ondansetron (ZOFRAN ODT) 4 MG disintegrating tablet Take 1 tablet (4 mg total) by mouth every 8 (eight) hours as needed for nausea or vomiting.   pravastatin (PRAVACHOL) 40 MG tablet TAKE 1 TABLET BY MOUTH EVERY DAY IN THE EVENING   PRESCRIPTION MEDICATION See admin instructions. CPAP- At bedtime and during any time of rest   RENVELA 800 MG tablet Take 800-2,400 mg by mouth See admin instructions. Take 2,400  mg by mouth three times a day with meals and 800 mg with each snack   traMADol (ULTRAM) 50 MG tablet Take 1 tablet (50 mg total) by mouth daily as needed.   traZODone (DESYREL) 50 MG tablet TAKE 1/2 TO 1 TABLET BY MOUTH AT BEDTIME AS NEEDED FOR SLEEP   vitamin C (ASCORBIC ACID) 250 MG tablet Take 1 tablet (250 mg total) by mouth daily.                                  Objective:   Physical Exam  Wts  08/29/2021         207 08/25/2020         216    08/06/2019      206 01/08/2019     209  10/09/2018       213  07/08/2018       222 03/31/2018         229  12/30/2017       222  09/30/2017         222  07/11/2017       217   10/18/14 233 lb 12.8 oz (106.051 kg)  10/05/14 235 lb 3.2 oz (106.686 kg)  09/20/14 230 lb 6.1 oz (104.5 kg)     Vital signs reviewed  08/29/2021  - Note at rest 02 sats  99% on RA   General appearance:    chronically ill appearing amb bm with freq throat clearing, non productive coughing fits almost to point of choking / mild  pseudowheeze over trachea    HEENT : Oropharynx  clear/ edentulous  Nasal turbinates nl    NECK :  without  apparent JVD/ palpable Nodes/TM    LUNGS: no acc muscle use,  Nl contour chest which is clear to A and P bilaterally without cough on insp or exp maneuvers   CV:  RRR  no s3 or murmur or increase in P2, and no edema   ABD:  soft and nontender with nl inspiratory excursion in the supine position. No bruits or organomegaly appreciated   MS:  Nl gait/ ext warm without deformities Or obvious joint restrictions  calf tenderness, cyanosis or clubbing    SKIN: warm and dry without lesions    NEURO:  alert, approp, nl sensorium with  no motor or cerebellar deficits apparent.      I personally reviewed images and agree with radiology impression as follows:   Chest CT w/o contrast  08/01/21  Unchanged central peribronchial thickening. Similar scattered centrilobular and tree-in-bud nodularity in both lower lobes and to a  lesser extent the left upper lobe. No consolidation, pleural effusion, or pneumothorax.     Assessment & Plan:

## 2021-08-29 NOTE — Telephone Encounter (Signed)
Sent in valium

## 2021-08-29 NOTE — Assessment & Plan Note (Addendum)
Quit smoking 02/2020  - Spirometry 07/11/2017  Restrictive/ min curvature on no rx - 07/11/2017   Try symb 80 2bid - 09/30/2017     try symbicort 160 2bid as has rhonchi on exam, still smoking  - Spirometry 03/31/2018  FEV1 1.4 (41%)  Ratio 0.72 min curvature p symb 160 x 2 in am with poor baseline hfa - 01/08/2019  After extensive coaching inhaler device,  effectiveness =    90% / reviewed golfer analogy taking practice inhalations using empty device   No flare of asthma off symbicort and just using saba neb which is approp given his symptoms/findings  are now all upper airway s/p cx surgery with persistent globus   ENT f/u planned in [place] this afternoon and f/u here can be prn if not better.    In meantime reviewed: Optimal use of albuterol short term per neb instead of hfa for now (he has found hfa just irritates upper airway)  Max rx for GERD / avoid cough drops in favor of sugar free hard rock candies (see dietary instructions in AVS) to avoid excessive throat clearing   F/u prn with all meds in hand using a trust but verify approach to confirm accurate Medication  Reconciliation The principal here is that until we are certain that the  patients are doing what we've asked, it makes no sense to ask them to do more.          Each maintenance medication was reviewed in detail including emphasizing most importantly the difference between maintenance and prns and under what circumstances the prns are to be triggered using an action plan format where appropriate.  Total time for H and P, chart review, counseling, reviewing hfa/neb device(s) and generating customized AVS unique to this office visit / same day charting = 33 min

## 2021-08-29 NOTE — Patient Outreach (Signed)
  Care Coordination   08/29/2021 Name: [name] MRN: [id number] DOB: September 15, 1960   Care Coordination Outreach Attempts:  An unsuccessful telephone outreach was attempted today to offer the patient information about available care coordination services as a benefit of their health plan.   Follow Up Plan:  Additional outreach attempts will be made to offer the patient care coordination information and services.   Encounter Outcome:  No Answer  Care Coordination Interventions Activated:  No   Care Coordination Interventions:  No, not indicated    Emelia Loron RN, BSN [place] [phone] [email or web address]

## 2021-08-30 DIAGNOSIS — N186 End stage renal disease: Secondary | ICD-10-CM | POA: Diagnosis not present

## 2021-08-30 DIAGNOSIS — D631 Anemia in chronic kidney disease: Secondary | ICD-10-CM | POA: Diagnosis not present

## 2021-08-30 DIAGNOSIS — Z992 Dependence on renal dialysis: Secondary | ICD-10-CM | POA: Diagnosis not present

## 2021-08-30 DIAGNOSIS — N2581 Secondary hyperparathyroidism of renal origin: Secondary | ICD-10-CM | POA: Diagnosis not present

## 2021-08-30 DIAGNOSIS — D509 Iron deficiency anemia, unspecified: Secondary | ICD-10-CM | POA: Diagnosis not present

## 2021-09-01 DIAGNOSIS — N2581 Secondary hyperparathyroidism of renal origin: Secondary | ICD-10-CM | POA: Diagnosis not present

## 2021-09-01 DIAGNOSIS — Z992 Dependence on renal dialysis: Secondary | ICD-10-CM | POA: Diagnosis not present

## 2021-09-01 DIAGNOSIS — D509 Iron deficiency anemia, unspecified: Secondary | ICD-10-CM | POA: Diagnosis not present

## 2021-09-01 DIAGNOSIS — D631 Anemia in chronic kidney disease: Secondary | ICD-10-CM | POA: Diagnosis not present

## 2021-09-01 DIAGNOSIS — N186 End stage renal disease: Secondary | ICD-10-CM | POA: Diagnosis not present

## 2021-09-04 DIAGNOSIS — Z992 Dependence on renal dialysis: Secondary | ICD-10-CM | POA: Diagnosis not present

## 2021-09-04 DIAGNOSIS — D631 Anemia in chronic kidney disease: Secondary | ICD-10-CM | POA: Diagnosis not present

## 2021-09-04 DIAGNOSIS — N186 End stage renal disease: Secondary | ICD-10-CM | POA: Diagnosis not present

## 2021-09-04 DIAGNOSIS — N2581 Secondary hyperparathyroidism of renal origin: Secondary | ICD-10-CM | POA: Diagnosis not present

## 2021-09-04 DIAGNOSIS — D509 Iron deficiency anemia, unspecified: Secondary | ICD-10-CM | POA: Diagnosis not present

## 2021-09-05 DIAGNOSIS — H903 Sensorineural hearing loss, bilateral: Secondary | ICD-10-CM | POA: Diagnosis not present

## 2021-09-06 DIAGNOSIS — Z992 Dependence on renal dialysis: Secondary | ICD-10-CM | POA: Diagnosis not present

## 2021-09-06 DIAGNOSIS — N2581 Secondary hyperparathyroidism of renal origin: Secondary | ICD-10-CM | POA: Diagnosis not present

## 2021-09-06 DIAGNOSIS — N186 End stage renal disease: Secondary | ICD-10-CM | POA: Diagnosis not present

## 2021-09-06 DIAGNOSIS — D631 Anemia in chronic kidney disease: Secondary | ICD-10-CM | POA: Diagnosis not present

## 2021-09-06 DIAGNOSIS — D509 Iron deficiency anemia, unspecified: Secondary | ICD-10-CM | POA: Diagnosis not present

## 2021-09-07 LAB — AFB CULTURE WITH SMEAR (NOT AT ARMC)
Acid Fast Culture: NEGATIVE
Acid Fast Smear: NEGATIVE

## 2021-09-08 DIAGNOSIS — Z992 Dependence on renal dialysis: Secondary | ICD-10-CM | POA: Diagnosis not present

## 2021-09-08 DIAGNOSIS — N2581 Secondary hyperparathyroidism of renal origin: Secondary | ICD-10-CM | POA: Diagnosis not present

## 2021-09-08 DIAGNOSIS — D631 Anemia in chronic kidney disease: Secondary | ICD-10-CM | POA: Diagnosis not present

## 2021-09-08 DIAGNOSIS — N186 End stage renal disease: Secondary | ICD-10-CM | POA: Diagnosis not present

## 2021-09-08 DIAGNOSIS — D509 Iron deficiency anemia, unspecified: Secondary | ICD-10-CM | POA: Diagnosis not present

## 2021-09-11 DIAGNOSIS — D509 Iron deficiency anemia, unspecified: Secondary | ICD-10-CM | POA: Diagnosis not present

## 2021-09-11 DIAGNOSIS — N186 End stage renal disease: Secondary | ICD-10-CM | POA: Diagnosis not present

## 2021-09-11 DIAGNOSIS — Z992 Dependence on renal dialysis: Secondary | ICD-10-CM | POA: Diagnosis not present

## 2021-09-11 DIAGNOSIS — N2581 Secondary hyperparathyroidism of renal origin: Secondary | ICD-10-CM | POA: Diagnosis not present

## 2021-09-11 DIAGNOSIS — D631 Anemia in chronic kidney disease: Secondary | ICD-10-CM | POA: Diagnosis not present

## 2021-09-12 ENCOUNTER — Ambulatory Visit (INDEPENDENT_AMBULATORY_CARE_PROVIDER_SITE_OTHER): Payer: Medicare Other | Admitting: Family

## 2021-09-12 ENCOUNTER — Encounter: Payer: Self-pay | Admitting: Family

## 2021-09-12 VITALS — BP 120/60 | HR 66 | Temp 97.3°F | Resp 16 | Ht 67.0 in | Wt 206.4 lb

## 2021-09-12 DIAGNOSIS — M170 Bilateral primary osteoarthritis of knee: Secondary | ICD-10-CM

## 2021-09-12 DIAGNOSIS — M542 Cervicalgia: Secondary | ICD-10-CM | POA: Diagnosis not present

## 2021-09-12 DIAGNOSIS — Z0000 Encounter for general adult medical examination without abnormal findings: Secondary | ICD-10-CM

## 2021-09-12 MED ORDER — METHOCARBAMOL 750 MG PO TABS: 750.0000 mg | ORAL_TABLET | Freq: Four times a day (QID) | ORAL | 1 refills | Status: DC

## 2021-09-12 MED ORDER — TRAMADOL HCL 50 MG PO TABS: 50.0000 mg | ORAL_TABLET | Freq: Every day | ORAL | 0 refills | Status: DC | PRN

## 2021-09-12 NOTE — Progress Notes (Signed)
Subjective:   Kirk [name] is a 61 y.o. male who presents for Medicare Annual/Subsequent preventive examination.  Review of Systems    Cardiac Risk Factors include: advanced age (>46mn, >>47women);male gender;smoking/ tobacco exposure;obesity (BMI >30kg/m2)     Objective:    Today's Vitals   09/12/21 1111  BP: 120/60  Pulse: 66  Resp: 16  Temp: (!) 97.3 F (36.3 C)  SpO2: 96%  Weight: 206 lb 6.4 oz (93.6 kg)  Height: '5\' 7"'$  (1.702 m)   Body mass index is 32.33 kg/m.     07/04/2021    3:29 PM 05/18/2021    2:52 PM 03/23/2021   11:07 AM 03/22/2021    4:45 PM 02/09/2021    2:39 PM 01/09/2021    9:04 PM 01/09/2021    1:59 PM  Advanced Directives  Does Patient Have a Medical Advance Directive? No No No No No No No  Would patient like information on creating a medical advance directive? No - Patient declined No - Patient declined No - Patient declined No - Patient declined No - Patient declined No - Patient declined No - Patient declined    Current Medications (verified) Outpatient Encounter Medications as of 09/12/2021  Medication Sig   acetaminophen (TYLENOL) 500 MG tablet Take 1 tablet (500 mg total) by mouth every 6 (six) hours as needed for moderate pain.   albuterol (PROVENTIL HFA;VENTOLIN HFA) 108 (90 Base) MCG/ACT inhaler Inhale 2 puffs into the lungs every 6 (six) hours as needed for wheezing or shortness of breath.   amLODipine (NORVASC) 10 MG tablet TAKE 1 TABLET BY MOUTH EVERY DAY   aspirin EC 81 MG tablet Take 81 mg by mouth daily.   b complex-vitamin c-folic acid (NEPHRO-VITE) 0.8 MG TABS tablet TAKE 1 TABLET BY MOUTH EVERY DAY   buPROPion (WELLBUTRIN XL) 150 MG 24 hr tablet Take by mouth as needed.   calcitRIOL (ROCALTROL) 0.5 MCG capsule Take 2 capsules (1 mcg total) by mouth every Monday, Wednesday, and Friday with hemodialysis.   Cholecalciferol (VITAMIN D3) 25 MCG (1000 UT) CAPS Take 4,000 Units by mouth daily.   diazepam (VALIUM) 2 MG tablet Take one pill  one hour prior to mri and repeat just prior if  needed   diphenhydrAMINE (BENADRYL) 25 MG tablet Take 50 mg by mouth at bedtime.   doxazosin (CARDURA) 4 MG tablet Take 1 tablet (4 mg total) by mouth daily.   ethyl chloride spray Apply topically as needed.   folic acid in sodium chloride 0.9 % 50 mL Inject into the vein. And B-Complex.   guaiFENesin (MUCINEX) 600 MG 12 hr tablet Take 1,200 mg by mouth 2 (two) times daily.   hydrALAZINE (APRESOLINE) 100 MG tablet TAKE 1 TABLET EVERY MORNINGAND AT BEDTIME.   ipratropium-albuterol (DUONEB) 0.5-2.5 (3) MG/3ML SOLN Take 3 mLs by nebulization every 4 (four) hours as needed (Shortness of breath).   isosorbide mononitrate (IMDUR) 30 MG 24 hr tablet TAKE 1 TABLET DAILY   lidocaine-prilocaine (EMLA) cream Apply 1 application topically See admin instructions. Apply 1 to 2 hours prior to dialysis on Mondays, Wednesdays, and Fridays. Cover with saran wrap.   methocarbamol (ROBAXIN) 750 MG tablet Take 750 mg by mouth in the morning, at noon, in the evening, and at bedtime.   metoprolol tartrate (LOPRESSOR) 100 MG tablet TAKE 1 TABLET BY MOUTH TWICE A DAY   omeprazole (PRILOSEC) 20 MG capsule TAKE 1 CAPSULE TWICE DAILY BEFORE A MEAL   ondansetron (ZOFRAN ODT) 4 MG  disintegrating tablet Take 1 tablet (4 mg total) by mouth every 8 (eight) hours as needed for nausea or vomiting.   pravastatin (PRAVACHOL) 40 MG tablet TAKE 1 TABLET BY MOUTH EVERY DAY IN THE EVENING   PRESCRIPTION MEDICATION See admin instructions. CPAP- At bedtime and during any time of rest   RENVELA 800 MG tablet Take 800-2,400 mg by mouth See admin instructions. Take 2,400 mg by mouth three times a day with meals and 800 mg with each snack   traMADol (ULTRAM) 50 MG tablet Take 1 tablet (50 mg total) by mouth daily as needed.   traZODone (DESYREL) 50 MG tablet TAKE 1/2 TO 1 TABLET BY MOUTH AT BEDTIME AS NEEDED FOR SLEEP   vitamin C (ASCORBIC ACID) 250 MG tablet Take 1 tablet (250 mg total) by mouth  daily.   [DISCONTINUED] doxycycline (VIBRA-TABS) 100 MG tablet Take 1 tablet (100 mg total) by mouth 2 (two) times daily.   No facility-administered encounter medications on file as of 09/12/2021.    Allergies (verified) Codeine and Hydrocodone-acetaminophen   History: 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)    Anxiety    Asthma    Asthma    moderate persistent   Carpal tunnel syndrome    Cellulitis and abscess of trunk    Cholelithiasis 07/13/2014   Chronic headaches    Debility, unspecified    Dermatophytosis of the body    Dysrhythmia    history of   Edema    End stage renal disease on dialysis [facility])    "MWF; [facility]" (10/21/2014)   Essential hypertension, benign    GERD (gastroesophageal reflux disease)    Gout, unspecified    HTN (hypertension)    Hypertrophy of prostate without urinary obstruction and other lower urinary tract symptoms (LUTS)    Hypotension, unspecified    Impotence of organic origin    Insomnia, unspecified    Kidney replaced by transplant    Localization-related (focal) (partial) epilepsy and epileptic syndromes with complex partial seizures, without mention of intractable epilepsy    12-15-19- Wife states he has NEVER had a seizure    Lumbago    Memory loss    OSA on CPAP    Other and unspecified hyperlipidemia    controlled /managed per wife    Other chronic nonalcoholic liver disease    Other malaise and fatigue    Other nonspecific abnormal serum enzyme levels    Pain in joint, lower leg    Pain in joint, upper arm    Pneumonia "several times"   Renal dialysis status(V45.11) 02/05/2010   restarted 01/02/13 ofter renal trransplant failure   Secondary hyperparathyroidism (of renal origin)    Shortness of breath    Sleep apnea    wears cpap    Tension headache     Unspecified constipation    Unspecified essential hypertension    Unspecified hereditary and idiopathic peripheral neuropathy    Unspecified vitamin D deficiency    Past Surgical History:  Procedure Laterality Date   AV FISTULA PLACEMENT Left ?2010   "forearm; at [facility]"   [place]  03/21/2011   CHOLECYSTECTOMY N/A 10/21/2014   Procedure: LAPAROSCOPIC CHOLECYSTECTOMY WITH INTRAOPERATIVE CHOLANGIOGRAM;  Surgeon: Autumn Messing III, MD;  Location: [place];  Service: General;  Laterality: N/A;   COLONOSCOPY     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   KIDNEY TRANSPLANT  08/17/2011   [facility]    LAPAROSCOPIC CHOLECYSTECTOMY  10/21/2014   w/IOC   LEFT HEART CATHETERIZATION WITH CORONARY ANGIOGRAM N/A 03/21/2011   Procedure: LEFT HEART CATHETERIZATION WITH CORONARY ANGIOGRAM;  Surgeon: [name], MD;  Location[facility] C[facility] LAB;  Service: Cardiovascular;  Laterality: N/A;   NEPHRECTOMY  08/2013   removed transplaned kidney   POLYPECTOMY     POSTERIOR FUSION CERVICAL SPINE  06/25/2012   for spinal stenosis   VASECTOMY  2010   Family History  Adopted: Yes  Problem Relation Age of Onset   Colon cancer Neg Hx    Esophageal cancer Neg Hx    Rectal cancer Neg Hx    Stomach cancer Neg Hx    Colon polyps Neg Hx    Social History   Socioeconomic History   Marital status: Married    Spouse name: [name]   Number of children: 3   Years of education: Not on file   Highest education level: Not on file  Occupational History   Occupation: disabled    Employer: DISABLED  Tobacco Use   Smoking status: Former    Packs/day: 0.50    Years: 32.00    Total pack years: 16.00    Types: Cigarettes    Start date: 1993    Quit date: 02/24/2020    Years since quitting: 1.5   Smokeless tobacco: Never  Vaping Use   Vaping Use: Never used  Substance and Sexual Activity   Alcohol use: No    Alcohol/week: 0.0 standard drinks of alcohol   Drug  use: No   Sexual activity: Yes  Other Topics Concern   Not on file  Social History Narrative   Drinks 1 cup of caffeine daily.   Social Determinants of Health   Financial Resource Strain: Low Risk  (06/25/2017)   Overall Financial Resource Strain (CARDIA)    Difficulty of Paying Living Expenses: Not hard at all  Food Insecurity: No Food Insecurity (06/25/2017)   Hunger Vital Sign    Worried About Running Out of Food in the Last Year: Never true    Ran Out of Food in the Last Year: Never true  Transportation Needs: No Transportation Needs (06/25/2017)   PRAPARE - Hydrologist (Medical): No    Lack of Transportation (Non-Medical): No  Physical Activity: Insufficiently Active (06/25/2017)   Exercise Vital Sign    Days of Exercise per Week: 3 days    Minutes of Exercise per Session: 20 min  Stress: No Stress Concern Present (06/25/2017)   [place]    Feeling of Stress : Only a little  Social Connections: Moderately Integrated (06/25/2017)   Social Connection and Isolation Panel [NHANES]    Frequency of Communication with Friends and Family: More than three times a week    Frequency of Social Gatherings with Friends and Family: More than three times a week    Attends Religious Services: More than 4 times per year    Active Member of [employer] or Organizations: No    Attends Archivist Meetings: Never    Marital Status: Married    Tobacco Counseling Counseling given: Not Answered   Clinical Intake:  Pre-visit preparation completed: No  Pain : 0-10 Pain Score:  (10 depending how he moves)  Pain Type: Chronic pain Pain Location: Back (neck) Pain Orientation: Lower Pain Radiating Towards: both legs Pain Descriptors / Indicators: Shooting, Constant Pain Onset: Other (comment) (one year) Pain Frequency: Constant Pain Relieving Factors: Nothing.sometimes change of position or heating  pad Effect of Pain on Daily Activities: walking,sitting or lying  Pain Relieving Factors: Nothing.sometimes change of position or heating pad  BMI - recorded: 32.33 Nutritional Status: BMI > 30  Obese Nutritional Risks: None Diabetes: No  How often do you need to have someone help you when you read instructions, pamphlets, or other written materials from your doctor or pharmacy?: 5 - Always What is the last grade level you completed in school?: 12 grade  Diabetic?No   Interpreter Needed?: No      Activities of Daily Living    09/12/2021   12:05 PM 01/09/2021    9:07 PM  In your present state of health, do you have any difficulty performing the following activities:  Hearing? 1   Comment decreased hearing   Vision? 1   Difficulty concentrating or making decisions? 1   Comment sometimes   Walking or climbing stairs? 1   Comment due to pain   Dressing or bathing? 0   Doing errands, shopping? 1 1  Comment wife assist has trouble turning neck  Preparing Food and eating ? Y   Comment wife assist   Using the Toilet? N   In the past six months, have you accidently leaked urine? N   Do you have problems with loss of bowel control? Y   Comment sometimes diarrhea   Managing your Medications? Y   Comment wife assist   Managing your Finances? Y   Housekeeping or managing your Housekeeping? Y   Comment wife assist     Patient Care Team: Caydyn Sprung, Nelda Bucks, NP as PCP - General (Family Medicine) Debara Pickett Nadean Corwin, MD as PCP - Cardiology (Cardiology) Jovita Kussmaul, MD as Consulting Physician (General Surgery) [name], MD as Consulting Physician (Pulmonary Disease) Estanislado Emms, MD (Inactive) as Consulting Physician (Nephrology) Elam Dutch, MD (Inactive) as Consulting Physician (Vascular Surgery) [name], MD as Consulting Physician (Cardiology) Milus Banister, MD as Attending Physician (Gastroenterology) Jamal Maes, MD as Consulting Physician  (Nephrology)  Indicate any recent Medical Services you may have received from other than [facility] providers in the past year (date may be approximate).     Assessment:   This is a routine wellness examination for [place].  Hearing/Vision screen Hearing Screening - Comments:: Some hearing concerns. Patient has already seen doctor about this.  Vision Screening - Comments:: Some vision concerns. Patient wears reading glasses. Patient last eye exam 3 years ago.   Dietary issues and exercise activities discussed: Current Exercise Habits: Home exercise routine (walking), Type of exercise: walking, Time (Minutes): 10, Frequency (Times/Week): 3, Weekly Exercise (Minutes/Week): 30, Intensity: Mild, Exercise limited by: orthopedic condition(s) (back pain)   Goals Addressed             This Visit's Progress    Patient Stated   Not on track    Would like to recovery from pain to go back to what he used to do        Depression Screen    09/12/2021   11:24 AM 09/06/2020   11:32 AM 10/22/2019    3:02 PM 06/18/2019    3:56 PM 02/05/2019    2:40 PM 09/25/2018    2:41 PM 05/22/2018    2:18 PM  PHQ  2/9 Scores  PHQ - 2 Score 0 0 0 0 0 0 0    Fall Risk    09/12/2021   11:21 AM 07/04/2021    3:29 PM 05/18/2021    2:52 PM 03/22/2021    4:45 PM 02/09/2021    2:38 PM  Fall Risk   Falls in the past year? 0 0 0 0 1  Number falls in past yr: 0 0 0 0 0  Injury with Fall? 0 0 0 0 0  Risk for fall due to : No Fall Risks No Fall Risks No Fall Risks No Fall Risks History of fall(s)  Follow up Falls evaluation completed Falls evaluation completed Falls evaluation completed Falls evaluation completed Falls evaluation completed;Education provided;Falls prevention discussed    FALL RISK PREVENTION PERTAINING TO THE HOME:  Any stairs in or around the home? Yes  If so, are there any without handrails? No  Home free of loose throw rugs in walkways, pet beds, electrical cords, etc? Yes  Adequate lighting in your  home to reduce risk of falls? Yes   ASSISTIVE DEVICES UTILIZED TO PREVENT FALLS:  Life alert? No  Use of a cane, walker or w/c? Yes  Grab bars in the bathroom? No  Shower chair or bench in shower? No  Elevated toilet seat or a handicapped toilet? No   TIMED UP AND GO:  Was the test performed? Yes .  Length of time to ambulate 10 feet: 20 sec.   Gait slow and steady with assistive device  Cognitive Function:    09/12/2021   11:27 AM  MMSE - Mini Mental State Exam  Orientation to time 4  Orientation to time comments year 2023, season summer, date N/A, day Tuesday, month August.  Orientation to Place 3  Orientation to Place-comments state [place], county [place], city [place], facility N/A, provider N/A.  Registration 3  Attention/ Calculation 2  Attention/Calculation-comments DL  Recall 3  Language- name 2 objects 2  Language- repeat 0  Language- follow 3 step command 3  Language- read & follow direction 1  Write a sentence 0  Write a sentence-comments I need to go finish today OK.  Copy design 1  Total score 22        09/06/2020   11:33 AM  6CIT Screen  What Year? 0 points  What month? 0 points  What time? 0 points  Count back from 20 0 points  Months in reverse 0 points  Repeat phrase 6 points  Total Score 6 points    Immunizations Immunization History  Administered Date(s) Administered   Influenza Whole 11/01/2010   Influenza, Seasonal, Injecte, Preservative Fre 10/29/2012   Influenza-Unspecified 11/21/2011, 11/05/2012, 09/22/2013, 09/22/2017, 11/23/2019, 10/28/2020, 12/04/2020   Moderna Sars-Covid-2 Vaccination 04/06/2019, 05/07/2019, 03/31/2020   PPD Test 12/26/2012   Pneumococcal Polysaccharide-23 11/01/2010, 10/29/2012   Pneumococcal-Unspecified 11/05/2012   Tdap 01/22/2006, 06/08/2021    TDAP status: Up to date  Flu Vaccine status: Due, Education has been provided regarding the importance of this vaccine. Advised may receive this vaccine at local  pharmacy or Health Dept. Aware to provide a copy of the vaccination record if obtained from local pharmacy or Health Dept. Verbalized acceptance and understanding.  Pneumococcal vaccine status: Due, Education has been provided regarding the importance of this vaccine. Advised may receive this vaccine at local pharmacy or Health Dept. Aware to provide a copy of the vaccination record if obtained from local pharmacy or Health Dept. Verbalized acceptance and understanding.  Covid-19 vaccine  status: Information provided on how to obtain vaccines.   Qualifies for Shingles Vaccine? Yes   Zostavax completed No   Shingrix Completed?: No.    Education has been provided regarding the importance of this vaccine. Patient has been advised to call insurance company to determine out of pocket expense if they have not yet received this vaccine. Advised may also receive vaccine at local pharmacy or Health Dept. Verbalized acceptance and understanding.  Screening Tests Health Maintenance  Topic Date Due   Zoster Vaccines- Shingrix (1 of 2) Never done   COVID-19 Vaccine (4 - Moderna risk series) 05/26/2020   INFLUENZA VACCINE  10/23/2021 (Originally 08/22/2021)   HEMOGLOBIN A1C  11/25/2021   COLONOSCOPY (Pts 45-72yr Insurance coverage will need to be confirmed)  12/29/2026   TETANUS/TDAP  06/09/2031   Hepatitis C Screening  Completed   HIV Screening  Completed   HPV VACCINES  Aged Out   FOOT EXAM  Discontinued   OPHTHALMOLOGY EXAM  Discontinued    Health Maintenance  Health Maintenance Due  Topic Date Due   Zoster Vaccines- Shingrix (1 of 2) Never done   COVID-19 Vaccine (4 - Moderna risk series) 05/26/2020    Colorectal cancer screening: Type of screening: Colonoscopy. Completed 12/29/2019. Repeat every 10 years  Lung Cancer Screening: (Low Dose CT Chest recommended if Age 772-80years, 30 pack-year currently smoking OR have quit w/in 15years.) does not qualify.   Lung Cancer Screening Referral: yes    Additional Screening:  Hepatitis C Screening: does qualify; Completed yes   Vision Screening: Recommended annual ophthalmology exams for early detection of glaucoma and other disorders of the eye. Is the patient up to date with their annual eye exam?  Yes  Who is the provider or what is the name of the office in which the patient attends annual eye exams? Does not recall name  If pt is not established with a provider, would they like to be referred to a provider to establish care? No .   Dental Screening: Recommended annual dental exams for proper oral hygiene  Community Resource Referral / Chronic Care Management: CRR required this visit?  No   CCM required this visit?  No      Plan:     I have personally reviewed and noted the following in the patient's chart:   Medical and social history Use of alcohol, tobacco or illicit drugs  Current medications and supplements including opioid prescriptions. Patient is not currently taking opioid prescriptions. Functional ability and status Nutritional status Physical activity Advanced directives List of other physicians Hospitalizations, surgeries, and ER visits in previous 12 months Vitals Screenings to include cognitive, depression, and falls Referrals and appointments  In addition, I have reviewed and discussed with patient certain preventive protocols, quality metrics, and best practice recommendations. A written personalized care plan for preventive services as well as general preventive health recommendations were provided to patient.     DSandrea Hughs NP   09/12/2021   Nurse Notes: Will get Tdap and COVID-19 boost vaccine at the pharmacy

## 2021-09-12 NOTE — Patient Instructions (Signed)
Mr. Kirk [name] , Thank you for taking time to come for your Medicare Wellness Visit. I appreciate your ongoing commitment to your health goals. Please review the following plan we discussed and let me know if I can assist you in the future.   Screening recommendations/referrals: Colonoscopy : Up to date Recommended yearly ophthalmology/optometry visit for glaucoma screening and checkup Recommended yearly dental visit for hygiene and checkup  Vaccinations: Influenza vaccine due annually in September/October Pneumococcal vaccine  Tdap vaccine : Up to date  Shingles vaccine : Due     Advanced directives: No   Conditions/risks identified: Advance age male > 59 yrs,male Gender, hx of smoking,BMI > 30   Next appointment: 1 year   Preventive Care 59-64 Years, Male Preventive care refers to lifestyle choices and visits with your health care provider that can promote health and wellness. What does preventive care include? A yearly physical exam. This is also called an annual well check. Dental exams once or twice a year. Routine eye exams. Ask your health care provider how often you should have your eyes checked. Personal lifestyle choices, including: Daily care of your teeth and gums. Regular physical activity. Eating a healthy diet. Avoiding tobacco and drug use. Limiting alcohol use. Practicing safe sex. Taking low-dose aspirin every day starting at age 59. What happens during an annual well check? The services and screenings done by your health care provider during your annual well check will depend on your age, overall health, lifestyle risk factors, and family history of disease. Counseling  Your health care provider may ask you questions about your: Alcohol use. Tobacco use. Drug use. Emotional well-being. Home and relationship well-being. Sexual activity. Eating habits. Work and work Statistician. Screening  You may have the following tests or measurements: Height, weight,  and BMI. Blood pressure. Lipid and cholesterol levels. These may be checked every 5 years, or more frequently if you are over 59 years old. Skin check. Lung cancer screening. You may have this screening every year starting at age 59 if you have a 30-pack-year history of smoking and currently smoke or have quit within the past 15 years. Fecal occult blood test (FOBT) of the stool. You may have this test every year starting at age 59. Flexible sigmoidoscopy or colonoscopy. You may have a sigmoidoscopy every 5 years or a colonoscopy every 10 years starting at age 59. Prostate cancer screening. Recommendations will vary depending on your family history and other risks. Hepatitis C blood test. Hepatitis B blood test. Sexually transmitted disease (STD) testing. Diabetes screening. This is done by checking your blood sugar (glucose) after you have not eaten for a while (fasting). You may have this done every 1-3 years. Discuss your test results, treatment options, and if necessary, the need for more tests with your health care provider. Vaccines  Your health care provider may recommend certain vaccines, such as: Influenza vaccine. This is recommended every year. Tetanus, diphtheria, and acellular pertussis (Tdap, Td) vaccine. You may need a Td booster every 10 years. Zoster vaccine. You may need this after age 59. Pneumococcal 13-valent conjugate (PCV13) vaccine. You may need this if you have certain conditions and have not been vaccinated. Pneumococcal polysaccharide (PPSV23) vaccine. You may need one or two doses if you smoke cigarettes or if you have certain conditions. Talk to your health care provider about which screenings and vaccines you need and how often you need them. This information is not intended to replace advice given to you by your health care  provider. Make sure you discuss any questions you have with your health care provider. Document Released: 02/04/2015 Document Revised:  09/28/2015 Document Reviewed: 11/09/2014 Elsevier Interactive Patient Education  2017 [place] Prevention in the Home Falls can cause injuries. They can happen to people of all ages. There are many things you can do to make your home safe and to help prevent falls. What can I do on the outside of my home? Regularly fix the edges of walkways and driveways and fix any cracks. Remove anything that might make you trip as you walk through a door, such as a raised step or threshold. Trim any bushes or trees on the path to your home. Use bright outdoor lighting. Clear any walking paths of anything that might make someone trip, such as rocks or tools. Regularly check to see if handrails are loose or broken. Make sure that both sides of any steps have handrails. Any raised decks and porches should have guardrails on the edges. Have any leaves, snow, or ice cleared regularly. Use sand or salt on walking paths during winter. Clean up any spills in your garage right away. This includes oil or grease spills. What can I do in the bathroom? Use night lights. Install grab bars by the toilet and in the tub and shower. Do not use towel bars as grab bars. Use non-skid mats or decals in the tub or shower. If you need to sit down in the shower, use a plastic, non-slip stool. Keep the floor dry. Clean up any water that spills on the floor as soon as it happens. Remove soap buildup in the tub or shower regularly. Attach bath mats securely with double-sided non-slip rug tape. Do not have throw rugs and other things on the floor that can make you trip. What can I do in the bedroom? Use night lights. Make sure that you have a light by your bed that is easy to reach. Do not use any sheets or blankets that are too big for your bed. They should not hang down onto the floor. Have a firm chair that has side arms. You can use this for support while you get dressed. Do not have throw rugs and other things  on the floor that can make you trip. What can I do in the kitchen? Clean up any spills right away. Avoid walking on wet floors. Keep items that you use a lot in easy-to-reach places. If you need to reach something above you, use a strong step stool that has a grab bar. Keep electrical cords out of the way. Do not use floor polish or wax that makes floors slippery. If you must use wax, use non-skid floor wax. Do not have throw rugs and other things on the floor that can make you trip. What can I do with my stairs? Do not leave any items on the stairs. Make sure that there are handrails on both sides of the stairs and use them. Fix handrails that are broken or loose. Make sure that handrails are as long as the stairways. Check any carpeting to make sure that it is firmly attached to the stairs. Fix any carpet that is loose or worn. Avoid having throw rugs at the top or bottom of the stairs. If you do have throw rugs, attach them to the floor with carpet tape. Make sure that you have a light switch at the top of the stairs and the bottom of the stairs. If you do not have  them, ask someone to add them for you. What else can I do to help prevent falls? Wear shoes that: Do not have high heels. Have rubber bottoms. Are comfortable and fit you well. Are closed at the toe. Do not wear sandals. If you use a stepladder: Make sure that it is fully opened. Do not climb a closed stepladder. Make sure that both sides of the stepladder are locked into place. Ask someone to hold it for you, if possible. Clearly mark and make sure that you can see: Any grab bars or handrails. First and last steps. Where the edge of each step is. Use tools that help you move around (mobility aids) if they are needed. These include: Canes. Walkers. Scooters. Crutches. Turn on the lights when you go into a dark area. Replace any light bulbs as soon as they burn out. Set up your furniture so you have a clear path. Avoid  moving your furniture around. If any of your floors are uneven, fix them. If there are any pets around you, be aware of where they are. Review your medicines with your doctor. Some medicines can make you feel dizzy. This can increase your chance of falling. Ask your doctor what other things that you can do to help prevent falls. This information is not intended to replace advice given to you by your health care provider. Make sure you discuss any questions you have with your health care provider. Document Released: 11/04/2008 Document Revised: 06/16/2015 Document Reviewed: 02/12/2014 Elsevier Interactive Patient Education  2017 [employer].

## 2021-09-13 DIAGNOSIS — D509 Iron deficiency anemia, unspecified: Secondary | ICD-10-CM | POA: Diagnosis not present

## 2021-09-13 DIAGNOSIS — Z992 Dependence on renal dialysis: Secondary | ICD-10-CM | POA: Diagnosis not present

## 2021-09-13 DIAGNOSIS — N2581 Secondary hyperparathyroidism of renal origin: Secondary | ICD-10-CM | POA: Diagnosis not present

## 2021-09-13 DIAGNOSIS — D631 Anemia in chronic kidney disease: Secondary | ICD-10-CM | POA: Diagnosis not present

## 2021-09-13 DIAGNOSIS — N186 End stage renal disease: Secondary | ICD-10-CM | POA: Diagnosis not present

## 2021-09-15 DIAGNOSIS — D631 Anemia in chronic kidney disease: Secondary | ICD-10-CM | POA: Diagnosis not present

## 2021-09-15 DIAGNOSIS — N2581 Secondary hyperparathyroidism of renal origin: Secondary | ICD-10-CM | POA: Diagnosis not present

## 2021-09-15 DIAGNOSIS — Z992 Dependence on renal dialysis: Secondary | ICD-10-CM | POA: Diagnosis not present

## 2021-09-15 DIAGNOSIS — N186 End stage renal disease: Secondary | ICD-10-CM | POA: Diagnosis not present

## 2021-09-15 DIAGNOSIS — D509 Iron deficiency anemia, unspecified: Secondary | ICD-10-CM | POA: Diagnosis not present

## 2021-09-16 ENCOUNTER — Ambulatory Visit
Admission: RE | Admit: 2021-09-16 | Discharge: 2021-09-16 | Disposition: A | Discharge status: Home / Self Care | Payer: Medicare Other | Source: Ambulatory Visit | Attending: Orthopaedic Surgery | Admitting: Orthopaedic Surgery

## 2021-09-16 DIAGNOSIS — M19012 Primary osteoarthritis, left shoulder: Secondary | ICD-10-CM | POA: Diagnosis not present

## 2021-09-16 DIAGNOSIS — G8929 Other chronic pain: Secondary | ICD-10-CM

## 2021-09-16 DIAGNOSIS — M75112 Incomplete rotator cuff tear or rupture of left shoulder, not specified as traumatic: Secondary | ICD-10-CM | POA: Diagnosis not present

## 2021-09-16 DIAGNOSIS — M25511 Pain in right shoulder: Secondary | ICD-10-CM | POA: Diagnosis not present

## 2021-09-18 DIAGNOSIS — N2581 Secondary hyperparathyroidism of renal origin: Secondary | ICD-10-CM | POA: Diagnosis not present

## 2021-09-18 DIAGNOSIS — N186 End stage renal disease: Secondary | ICD-10-CM | POA: Diagnosis not present

## 2021-09-18 DIAGNOSIS — D509 Iron deficiency anemia, unspecified: Secondary | ICD-10-CM | POA: Diagnosis not present

## 2021-09-18 DIAGNOSIS — D631 Anemia in chronic kidney disease: Secondary | ICD-10-CM | POA: Diagnosis not present

## 2021-09-18 DIAGNOSIS — Z992 Dependence on renal dialysis: Secondary | ICD-10-CM | POA: Diagnosis not present

## 2021-09-20 DIAGNOSIS — Z992 Dependence on renal dialysis: Secondary | ICD-10-CM | POA: Diagnosis not present

## 2021-09-20 DIAGNOSIS — D509 Iron deficiency anemia, unspecified: Secondary | ICD-10-CM | POA: Diagnosis not present

## 2021-09-20 DIAGNOSIS — N186 End stage renal disease: Secondary | ICD-10-CM | POA: Diagnosis not present

## 2021-09-20 DIAGNOSIS — N2581 Secondary hyperparathyroidism of renal origin: Secondary | ICD-10-CM | POA: Diagnosis not present

## 2021-09-20 DIAGNOSIS — D631 Anemia in chronic kidney disease: Secondary | ICD-10-CM | POA: Diagnosis not present

## 2021-09-21 ENCOUNTER — Encounter: Payer: Self-pay | Admitting: Orthopaedic Surgery

## 2021-09-21 ENCOUNTER — Ambulatory Visit (INDEPENDENT_AMBULATORY_CARE_PROVIDER_SITE_OTHER): Payer: Medicare Other | Admitting: Orthopaedic Surgery

## 2021-09-21 DIAGNOSIS — M25511 Pain in right shoulder: Secondary | ICD-10-CM

## 2021-09-21 DIAGNOSIS — G8929 Other chronic pain: Secondary | ICD-10-CM | POA: Diagnosis not present

## 2021-09-21 DIAGNOSIS — M25512 Pain in left shoulder: Secondary | ICD-10-CM | POA: Diagnosis not present

## 2021-09-21 NOTE — Progress Notes (Signed)
Office Visit Note   Patient: Kirk [name]           Date of Birth: November 18, 1960           MRN: [id number] Visit Date: 09/21/2021              Requested by: Sandrea Hughs, NP [address] [place],  [place] [address] PCP: Sandrea Hughs, NP   Assessment & Plan: Visit Diagnoses:  1. Chronic left shoulder pain   2. Chronic right shoulder pain     Plan: MRI scans were reviewed with the patient in detail.  Left shoulder shows partial-thickness tear of the supraspinatus.  Long head biceps tendinosis.  Moderate arthropathy of the [facility] joint.  Glenohumeral chondromalacia and degenerative labral tear.  Right shoulder shows tendinosis of the supraspinatus with partial-thickness tear.  Tendinosis of the biceps tendon.  Moderate arthropathy of the [facility] joint.  Chondromalacia of the glenohumeral joint and degenerative labral tears.  These findings were reviewed in detail and treatment options were again reviewed and explained.  He would like to think about his options and let us know.  Follow-Up Instructions: No follow-ups on file.   Orders:  No orders of the defined types were placed in this encounter.  No orders of the defined types were placed in this encounter.     Procedures: No procedures performed   Clinical Data: No additional findings.   Subjective: Chief Complaint  Patient presents with   Left Shoulder - Follow-up    MRI review   Right Shoulder - Follow-up    MRI review    HPI Kirk [name] returns today with his wife to discuss shoulder MRI scans.  Denies any changes in his symptoms.  Left shoulder hurts worse than the right.  Review of Systems   Objective: Vital Signs: There were no vitals taken for this visit.  Physical Exam  Ortho Exam Examination of bilateral shoulders is unchanged. Specialty Comments:  No specialty comments available.  Imaging: No results found.   PMFS History: Patient Active Problem List   Diagnosis Date Noted   Chronic left shoulder  pain 09/21/2021   Chronic right shoulder pain 09/21/2021   Cough with hemoptysis 08/01/2021   Preop respiratory exam 03/02/2021   Intractable nausea and vomiting 01/09/2021   Abnormal CT of the chest 05/24/2020   Rotator cuff syndrome of right shoulder 03/50/0938   Chronic systolic heart failure ([place]) 09/25/2018   Fluid overload 09/17/2018   Hypertensive urgency 09/17/2018   Right knee pain 06/02/2018   Right tibial fracture 06/02/2018   Cigarette smoker 07/11/2017   Anxiety 05/30/2017   Hypokalemia    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   Shunt malfunction 07/03/2016   Need for hepatitis C screening test 06/29/2015   Myalgia 05/17/2015   Secondary central sleep apnea 12/09/2014   Obesity hypoventilation syndrome ([place]) 12/09/2014   Narcotic drug use 12/09/2014   Hypersomnia with sleep apnea 12/09/2014   SCC (squamous cell carcinoma), arm 11/09/2014   Mild persistent chronic asthma without complication 18/29/9371   ESRD on dialysis ([place]) 09/09/2014   Essential hypertension 09/09/2014   HLD (hyperlipidemia) 09/09/2014   Pancytopenia ([place]) 04/28/2014   Thrombocytopenia ([place]) 04/10/2014   Fungal dermatitis 03/17/2014   S/p nephrectomy 09/17/2013   Anemia 09/03/2012   Uncontrolled hypertension    Tension headache    Memory loss    Edema    Thoracic or lumbosacral neuritis or radiculitis 03/27/2012   Nerve root pain 03/27/2012   History of kidney  transplant 09/10/2011   Type 2 diabetes mellitus with diabetic nephropathy, without long-term current use of insulin ([place]) 08/30/2011   Hearing loss 08/16/2011   Obesity 08/16/2011   OSA on CPAP 08/05/2011   GIB (gastrointestinal bleeding) 10/28/2007   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)    Anxiety    Asthma    Asthma    moderate persistent   Carpal tunnel syndrome    Cellulitis  and abscess of trunk    Cholelithiasis 07/13/2014   Chronic headaches    Debility, unspecified    Dermatophytosis of the body    Dysrhythmia    history of   Edema    End stage renal disease on dialysis ([place])    "MWF; [facility] in [place]" (10/21/2014)   Essential hypertension, benign    GERD (gastroesophageal reflux disease)    Gout, unspecified    HTN (hypertension)    Hypertrophy of prostate without urinary obstruction and other lower urinary tract symptoms (LUTS)    Hypotension, unspecified    Impotence of organic origin    Insomnia, unspecified    Kidney replaced by transplant    Localization-related (focal) (partial) epilepsy and epileptic syndromes with complex partial seizures, without mention of intractable epilepsy    12-15-19- Wife states he has NEVER had a seizure    Lumbago    Memory loss    OSA on CPAP    Other and unspecified hyperlipidemia    controlled /managed per wife    Other chronic nonalcoholic liver disease    Other malaise and fatigue    Other nonspecific abnormal serum enzyme levels    Pain in joint, lower leg    Pain in joint, upper arm    Pneumonia "several times"   Renal dialysis status(V45.11) 02/05/2010   restarted 01/02/13 ofter renal trransplant failure   Secondary hyperparathyroidism (of renal origin)    Shortness of breath    Sleep apnea    wears cpap    Tension headache    Unspecified constipation    Unspecified essential hypertension    Unspecified hereditary and idiopathic peripheral neuropathy    Unspecified vitamin D deficiency     Family History  Adopted: Yes  Problem Relation Age of Onset   Colon cancer Neg Hx    Esophageal cancer Neg Hx    Rectal cancer Neg Hx    Stomach cancer Neg Hx    Colon polyps Neg Hx     Past Surgical History:  Procedure Laterality Date   AV FISTULA PLACEMENT Left ?2010   "forearm; at [place]"   [place]  03/21/2011   CHOLECYSTECTOMY N/A  10/21/2014   Procedure: LAPAROSCOPIC CHOLECYSTECTOMY WITH INTRAOPERATIVE CHOLANGIOGRAM;  Surgeon: Autumn Messing III, MD;  Location: [facility];  Service: General;  Laterality: N/A;   COLONOSCOPY     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   KIDNEY TRANSPLANT  08/17/2011   [place]  10/21/2014   w/IOC   LEFT HEART CATHETERIZATION WITH CORONARY ANGIOGRAM N/A 03/21/2011   Procedure: LEFT HEART CATHETERIZATION WITH CORONARY ANGIOGRAM;  Surgeon: [name], MD;  Location[facility] C[facility] LAB;  Service: Cardiovascular;  Laterality: N/A;   NEPHRECTOMY  08/2013   removed transplaned kidney   POLYPECTOMY  POSTERIOR FUSION CERVICAL SPINE  06/25/2012   for spinal stenosis   VASECTOMY  2010   Social History   Occupational History   Occupation: disabled    Employer: DISABLED  Tobacco Use   Smoking status: Former    Packs/day: 0.50    Years: 32.00    Total pack years: 16.00    Types: Cigarettes    Start date: 1993    Quit date: 02/24/2020    Years since quitting: 1.5   Smokeless tobacco: Never  Vaping Use   Vaping Use: Never used  Substance and Sexual Activity   Alcohol use: No    Alcohol/week: 0.0 standard drinks of alcohol   Drug use: No   Sexual activity: Yes

## 2021-09-22 DIAGNOSIS — N186 End stage renal disease: Secondary | ICD-10-CM | POA: Diagnosis not present

## 2021-09-22 DIAGNOSIS — T8612 Kidney transplant failure: Secondary | ICD-10-CM | POA: Diagnosis not present

## 2021-09-22 DIAGNOSIS — N2581 Secondary hyperparathyroidism of renal origin: Secondary | ICD-10-CM | POA: Diagnosis not present

## 2021-09-22 DIAGNOSIS — Z992 Dependence on renal dialysis: Secondary | ICD-10-CM | POA: Diagnosis not present

## 2021-09-22 DIAGNOSIS — D509 Iron deficiency anemia, unspecified: Secondary | ICD-10-CM | POA: Diagnosis not present

## 2021-09-22 DIAGNOSIS — D631 Anemia in chronic kidney disease: Secondary | ICD-10-CM | POA: Diagnosis not present

## 2021-09-25 DIAGNOSIS — N2581 Secondary hyperparathyroidism of renal origin: Secondary | ICD-10-CM | POA: Diagnosis not present

## 2021-09-25 DIAGNOSIS — Z992 Dependence on renal dialysis: Secondary | ICD-10-CM | POA: Diagnosis not present

## 2021-09-25 DIAGNOSIS — D509 Iron deficiency anemia, unspecified: Secondary | ICD-10-CM | POA: Diagnosis not present

## 2021-09-25 DIAGNOSIS — N186 End stage renal disease: Secondary | ICD-10-CM | POA: Diagnosis not present

## 2021-09-25 DIAGNOSIS — D631 Anemia in chronic kidney disease: Secondary | ICD-10-CM | POA: Diagnosis not present

## 2021-09-27 DIAGNOSIS — N2581 Secondary hyperparathyroidism of renal origin: Secondary | ICD-10-CM | POA: Diagnosis not present

## 2021-09-27 DIAGNOSIS — D631 Anemia in chronic kidney disease: Secondary | ICD-10-CM | POA: Diagnosis not present

## 2021-09-27 DIAGNOSIS — Z992 Dependence on renal dialysis: Secondary | ICD-10-CM | POA: Diagnosis not present

## 2021-09-27 DIAGNOSIS — D509 Iron deficiency anemia, unspecified: Secondary | ICD-10-CM | POA: Diagnosis not present

## 2021-09-27 DIAGNOSIS — N186 End stage renal disease: Secondary | ICD-10-CM | POA: Diagnosis not present

## 2021-09-29 DIAGNOSIS — N186 End stage renal disease: Secondary | ICD-10-CM | POA: Diagnosis not present

## 2021-09-29 DIAGNOSIS — Z992 Dependence on renal dialysis: Secondary | ICD-10-CM | POA: Diagnosis not present

## 2021-09-29 DIAGNOSIS — D509 Iron deficiency anemia, unspecified: Secondary | ICD-10-CM | POA: Diagnosis not present

## 2021-09-29 DIAGNOSIS — D631 Anemia in chronic kidney disease: Secondary | ICD-10-CM | POA: Diagnosis not present

## 2021-09-29 DIAGNOSIS — N2581 Secondary hyperparathyroidism of renal origin: Secondary | ICD-10-CM | POA: Diagnosis not present

## 2021-10-02 ENCOUNTER — Telehealth: Payer: Self-pay

## 2021-10-02 DIAGNOSIS — N2581 Secondary hyperparathyroidism of renal origin: Secondary | ICD-10-CM | POA: Diagnosis not present

## 2021-10-02 DIAGNOSIS — N186 End stage renal disease: Secondary | ICD-10-CM | POA: Diagnosis not present

## 2021-10-02 DIAGNOSIS — Z992 Dependence on renal dialysis: Secondary | ICD-10-CM | POA: Diagnosis not present

## 2021-10-02 DIAGNOSIS — D631 Anemia in chronic kidney disease: Secondary | ICD-10-CM | POA: Diagnosis not present

## 2021-10-02 DIAGNOSIS — D509 Iron deficiency anemia, unspecified: Secondary | ICD-10-CM | POA: Diagnosis not present

## 2021-10-02 NOTE — Patient Outreach (Signed)
  Care Coordination   10/02/2021 Name: Kirk [name] MRN: [id number] DOB: 1960/12/12   Care Coordination Outreach Attempts:  A second unsuccessful outreach was attempted today to offer the patient with information about available care coordination services as a benefit of their health plan.     Follow Up Plan:  Additional outreach attempts will be made to offer the patient care coordination information and services.   Encounter Outcome:  No Answer  Care Coordination Interventions Activated:  No   Care Coordination Interventions:  No, not indicated    Daneen Schick, BSW, CDP Social Worker, Certified Dementia Practitioner Care Coordination [phone]

## 2021-10-03 DIAGNOSIS — E878 Other disorders of electrolyte and fluid balance, not elsewhere classified: Secondary | ICD-10-CM | POA: Diagnosis not present

## 2021-10-03 DIAGNOSIS — F458 Other somatoform disorders: Secondary | ICD-10-CM | POA: Diagnosis not present

## 2021-10-03 DIAGNOSIS — Q8781 Alport syndrome: Secondary | ICD-10-CM | POA: Diagnosis not present

## 2021-10-03 DIAGNOSIS — H90A22 Sensorineural hearing loss, unilateral, left ear, with restricted hearing on the contralateral side: Secondary | ICD-10-CM | POA: Diagnosis not present

## 2021-10-03 DIAGNOSIS — H9313 Tinnitus, bilateral: Secondary | ICD-10-CM | POA: Diagnosis not present

## 2021-10-03 DIAGNOSIS — H7291 Unspecified perforation of tympanic membrane, right ear: Secondary | ICD-10-CM | POA: Diagnosis not present

## 2021-10-03 DIAGNOSIS — H90A31 Mixed conductive and sensorineural hearing loss, unilateral, right ear with restricted hearing on the contralateral side: Secondary | ICD-10-CM | POA: Diagnosis not present

## 2021-10-03 DIAGNOSIS — R42 Dizziness and giddiness: Secondary | ICD-10-CM | POA: Diagnosis not present

## 2021-10-03 DIAGNOSIS — Z974 Presence of external hearing-aid: Secondary | ICD-10-CM | POA: Diagnosis not present

## 2021-10-03 DIAGNOSIS — H9221 Otorrhagia, right ear: Secondary | ICD-10-CM | POA: Diagnosis not present

## 2021-10-03 DIAGNOSIS — H7011 Chronic mastoiditis, right ear: Secondary | ICD-10-CM | POA: Diagnosis not present

## 2021-10-04 DIAGNOSIS — D509 Iron deficiency anemia, unspecified: Secondary | ICD-10-CM | POA: Diagnosis not present

## 2021-10-04 DIAGNOSIS — D631 Anemia in chronic kidney disease: Secondary | ICD-10-CM | POA: Diagnosis not present

## 2021-10-04 DIAGNOSIS — N186 End stage renal disease: Secondary | ICD-10-CM | POA: Diagnosis not present

## 2021-10-04 DIAGNOSIS — Z992 Dependence on renal dialysis: Secondary | ICD-10-CM | POA: Diagnosis not present

## 2021-10-04 DIAGNOSIS — N2581 Secondary hyperparathyroidism of renal origin: Secondary | ICD-10-CM | POA: Diagnosis not present

## 2021-10-06 DIAGNOSIS — Z992 Dependence on renal dialysis: Secondary | ICD-10-CM | POA: Diagnosis not present

## 2021-10-06 DIAGNOSIS — D509 Iron deficiency anemia, unspecified: Secondary | ICD-10-CM | POA: Diagnosis not present

## 2021-10-06 DIAGNOSIS — N2581 Secondary hyperparathyroidism of renal origin: Secondary | ICD-10-CM | POA: Diagnosis not present

## 2021-10-06 DIAGNOSIS — N186 End stage renal disease: Secondary | ICD-10-CM | POA: Diagnosis not present

## 2021-10-06 DIAGNOSIS — D631 Anemia in chronic kidney disease: Secondary | ICD-10-CM | POA: Diagnosis not present

## 2021-10-09 ENCOUNTER — Encounter (HOSPITAL_COMMUNITY): Payer: Self-pay

## 2021-10-09 ENCOUNTER — Other Ambulatory Visit: Payer: Self-pay

## 2021-10-09 ENCOUNTER — Emergency Department (HOSPITAL_COMMUNITY)
Admission: EM | Admit: 2021-10-09 | Discharge: 2021-10-09 | Disposition: A | Discharge status: Home / Self Care | Payer: Medicare Other | Attending: Emergency Medicine | Admitting: Emergency Medicine

## 2021-10-09 ENCOUNTER — Emergency Department (HOSPITAL_COMMUNITY): Payer: Medicare Other

## 2021-10-09 DIAGNOSIS — N2581 Secondary hyperparathyroidism of renal origin: Secondary | ICD-10-CM | POA: Diagnosis not present

## 2021-10-09 DIAGNOSIS — M25571 Pain in right ankle and joints of right foot: Secondary | ICD-10-CM | POA: Diagnosis not present

## 2021-10-09 DIAGNOSIS — I120 Hypertensive chronic kidney disease with stage 5 chronic kidney disease or end stage renal disease: Secondary | ICD-10-CM | POA: Diagnosis not present

## 2021-10-09 DIAGNOSIS — Z992 Dependence on renal dialysis: Secondary | ICD-10-CM | POA: Diagnosis not present

## 2021-10-09 DIAGNOSIS — N186 End stage renal disease: Secondary | ICD-10-CM | POA: Insufficient documentation

## 2021-10-09 DIAGNOSIS — D509 Iron deficiency anemia, unspecified: Secondary | ICD-10-CM | POA: Diagnosis not present

## 2021-10-09 DIAGNOSIS — D631 Anemia in chronic kidney disease: Secondary | ICD-10-CM | POA: Diagnosis not present

## 2021-10-09 DIAGNOSIS — Z79899 Other long term (current) drug therapy: Secondary | ICD-10-CM | POA: Diagnosis not present

## 2021-10-09 DIAGNOSIS — Z7982 Long term (current) use of aspirin: Secondary | ICD-10-CM | POA: Insufficient documentation

## 2021-10-09 NOTE — Discharge Instructions (Signed)
X-ray imaging today did not show significant acute fracture or dislocation.  Recommend close follow-up with orthopedics for foot and ankle specialist.  Contact information for local foot and ankle specialist Dr. Posey Pronto has been provided for you.  Please call to schedule a follow-up appoint within the next 2 to 3 days for reevaluation continue medical management.  Continue use of your tramadol.  Continue resting and also elevating the injury.  Try to refrain from walking on this until able to be cleared by orthopedics.  Return to the ED for any new or worsening symptoms as discussed.

## 2021-10-09 NOTE — ED Provider Notes (Incomplete)
[facility] DEPT Provider Note   CSN: [id number] Arrival date & time: 10/09/21  1738     History {Add pertinent medical, surgical, social history, OB history to HPI:1} Chief Complaint  Patient presents with   Ankle Pain    Kirk [name] is a 61 y.o. male presenting today with acute right ankle pain.  Fell yesterday, landed on the left knee and twisted the right ankle.  Denies feeling lightheaded, dizziness, chest pain, shortness of breath, or disorientation before or after the fall.  States he is unconcerned of the knee.  Had mild ankle pain last night.  However after finishing dialysis today, he noted increasing pain of the right ankle when "the fluid was drained off".  Has tried tramadol and Robaxin, however without relief.  Denies fever or chills.  Denies numbness or tingling of the affected extremity.  The history is provided by the patient and medical records.  Ankle Pain    Home Medications Prior to Admission medications   Medication Sig Start Date End Date Taking? Authorizing Provider  acetaminophen (TYLENOL) 500 MG tablet Take 1 tablet (500 mg total) by mouth every 6 (six) hours as needed for moderate pain. 09/12/14   Samuella Cota, MD  albuterol (PROVENTIL HFA;VENTOLIN HFA) 108 (90 Base) MCG/ACT inhaler Inhale 2 puffs into the lungs every 6 (six) hours as needed for wheezing or shortness of breath. 06/05/17   Patrecia Pour, MD  amLODipine (NORVASC) 10 MG tablet TAKE 1 TABLET BY MOUTH EVERY DAY 04/17/21   [name], MD  aspirin EC 81 MG tablet Take 81 mg by mouth daily.    [provider]  b complex-vitamin c-folic acid (NEPHRO-VITE) 0.8 MG TABS tablet TAKE 1 TABLET BY MOUTH EVERY DAY 02/08/20   Reed, Tiffany L, DO  buPROPion (WELLBUTRIN XL) 150 MG 24 hr tablet Take by mouth as needed. 02/25/20   [provider]  calcitRIOL (ROCALTROL) 0.5 MCG capsule Take 2 capsules (1 mcg total) by mouth every Monday, Wednesday, and  Friday with hemodialysis. 06/04/18   Black, Lezlie Octave, NP  Cholecalciferol (VITAMIN D3) 25 MCG (1000 UT) CAPS Take 4,000 Units by mouth daily.    [provider]  diazepam (VALIUM) 2 MG tablet Take one pill one hour prior to mri and repeat just prior if  needed 08/29/21   Aundra Dubin, PA-C  diphenhydrAMINE (BENADRYL) 25 MG tablet Take 50 mg by mouth at bedtime.    [provider]  doxazosin (CARDURA) 4 MG tablet Take 1 tablet (4 mg total) by mouth daily. 07/04/21   Ngetich, Dinah C, NP  ethyl chloride spray Apply topically as needed.    [provider]  folic acid in sodium chloride 0.9 % 50 mL Inject into the vein. And B-Complex.    [provider]  guaiFENesin (MUCINEX) 600 MG 12 hr tablet Take 1,200 mg by mouth 2 (two) times daily.    [provider]  hydrALAZINE (APRESOLINE) 100 MG tablet TAKE 1 TABLET EVERY MORNINGAND AT BEDTIME. 08/22/21   Hilty, Nadean Corwin, MD  ipratropium-albuterol (DUONEB) 0.5-2.5 (3) MG/3ML SOLN Take 3 mLs by nebulization every 4 (four) hours as needed (Shortness of breath). 12/05/18   Mariel Aloe, MD  isosorbide mononitrate (IMDUR) 30 MG 24 hr tablet TAKE 1 TABLET DAILY 11/10/19   Hilty, Nadean Corwin, MD  lidocaine-prilocaine (EMLA) cream Apply 1 application topically See admin instructions. Apply 1 to 2 hours prior to dialysis on Mondays, Wednesdays, and Fridays.  Cover with saran wrap. 11/18/18   [provider]  methocarbamol (ROBAXIN) 750 MG tablet Take 1 tablet (750 mg total) by mouth in the morning, at noon, in the evening, and at bedtime. 09/12/21   Ngetich, Dinah C, NP  metoprolol tartrate (LOPRESSOR) 100 MG tablet TAKE 1 TABLET BY MOUTH TWICE A DAY 09/21/20   Hilty, Nadean Corwin, MD  omeprazole (PRILOSEC) 20 MG capsule TAKE 1 CAPSULE TWICE DAILY BEFORE A MEAL 02/09/21   Ngetich, Dinah C, NP  ondansetron (ZOFRAN ODT) 4 MG disintegrating tablet Take 1 tablet (4 mg total) by mouth every 8 (eight) hours as needed for nausea  or vomiting. 01/11/21   Bonnielee Haff, MD  pravastatin (PRAVACHOL) 40 MG tablet TAKE 1 TABLET BY MOUTH EVERY DAY IN THE EVENING 05/22/21   Ngetich, Nelda Bucks, NP  PRESCRIPTION MEDICATION See admin instructions. CPAP- At bedtime and during any time of rest    [provider]  RENVELA 800 MG tablet Take 800-2,400 mg by mouth See admin instructions. Take 2,400 mg by mouth three times a day with meals and 800 mg with each snack 07/06/19   [provider]  traMADol (ULTRAM) 50 MG tablet Take 1 tablet (50 mg total) by mouth daily as needed. 09/12/21   Ngetich, Dinah C, NP  traZODone (DESYREL) 50 MG tablet TAKE 1/2 TO 1 TABLET BY MOUTH AT BEDTIME AS NEEDED FOR SLEEP 05/29/21   Ngetich, Dinah C, NP  vitamin C (ASCORBIC ACID) 250 MG tablet Take 1 tablet (250 mg total) by mouth daily. 07/04/21   Ngetich, Dinah C, NP      Allergies    Codeine and Hydrocodone-acetaminophen    Review of Systems   Review of Systems  Musculoskeletal:        Acute right ankle pain    Physical Exam Updated Vital Signs BP (!) 184/78 (BP Location: Right Arm)   Pulse 68   Temp 99.1 F (37.3 C) (Oral)   Resp 16   Ht '5\' 7"'$  (1.702 m)   Wt 93 kg   SpO2 99%   BMI 32.11 kg/m  Physical Exam Vitals and nursing note reviewed.  Constitutional:      General: He is not in acute distress.    Appearance: He is well-developed.  HENT:     Head: Normocephalic and atraumatic.  Eyes:     Conjunctiva/sclera: Conjunctivae normal.  Cardiovascular:     Rate and Rhythm: Normal rate and regular rhythm.     Heart sounds: No murmur heard. Pulmonary:     Effort: Pulmonary effort is normal. No respiratory distress.     Breath sounds: Normal breath sounds.  Abdominal:     Palpations: Abdomen is soft.     Tenderness: There is no abdominal tenderness.  Musculoskeletal:        General: Tenderness present. No swelling.     Cervical back: Neck supple.     Comments: Tenderness of the lateral malleolus with mild to minimal  swelling of the right ankle.  ROM appears grossly intact, mildly limited due to elicited tenderness.  Right lower extremity appears neurovascularly intact.  No evidence of bony deformity, significant soft tissue swelling, erythema, warmth, or pain out of proportion.  Mild difficulty with ambulation again due to elicited tenderness.  Skin:    General: Skin is warm and dry.     Capillary Refill: Capillary refill takes less than 2 seconds.  Neurological:     Mental Status: He is alert and oriented to person, place,  and time.  Psychiatric:        Mood and Affect: Mood normal.     ED Results / Procedures / Treatments   Labs (all labs ordered are listed, but only abnormal results are displayed) Labs Reviewed - No data to display  EKG None  Radiology DG Ankle Complete Right  Result Date: 10/09/2021 CLINICAL DATA:  [place] yesterday, lateral right ankle pain EXAM: RIGHT ANKLE - COMPLETE 3+ VIEW COMPARISON:  None Available. FINDINGS: Frontal, oblique, and lateral views of the right ankle are obtained. No acute fracture, subluxation, or dislocation. Small inferior calcaneal spur. Diffuse vascular calcifications. Soft tissues are otherwise unremarkable. IMPRESSION: 1. No acute displaced fracture. Electronically Signed   By: Randa Ngo M.D.   On: 10/09/2021 18:24    Procedures Procedures  {Document cardiac monitor, telemetry assessment procedure when appropriate:1}  Medications Ordered in ED Medications - No data to display  ED Course/ Medical Decision Making/ A&P                           Medical Decision Making Amount and/or Complexity of Data Reviewed Radiology: ordered.   61 y.o. male presents to the ED for concern of Ankle Pain   This involves an extensive number of treatment options, and is a complaint that carries with it a high risk of complications and morbidity.  The emergent differential diagnosis prior to evaluation includes, but is not limited to: Fracture, dislocation,  contusion, sprain  This is not an exhaustive differential.   Past Medical History / Co-morbidities / Social History: Hx of anxiety, HTN, chronic nonalcoholic liver disease, headaches, BPH, secondary hyperparathyroidism, ESRD on dialysis, anemia of chronic disease, GERD, asthma Social Determinants of Health include: Elderly  Additional History:  None  Lab Tests: None  Imaging Studies: I ordered imaging studies including XR right ankle.   I independently visualized and interpreted imaging which showed no evidence of acute fracture, dislocation, or significant soft tissue swelling I agree with the radiologist interpretation.  ED Course: Pt well-appearing on exam.  Presenting today with acute right ankle pain.  Mild tenderness of the right lateral malleolus on exam, without significant swelling.  Compartments soft of lower extremity, appears neurovascularly intact, low suspicion for ischemia or compartment syndrome.  ROM appears grossly intact, with some tenderness elicited on movement.  Still able to wiggle toes.  Patient X-Ray negative for obvious fracture or dislocation.  Physical exam and history not clinically suggestive of gout, septic arthritis, or reactive arthritis.  Patient noted to be mildly hypertensive, though very hemodynamically stable on exam.  Patient states his blood pressure is normally elevated, close to baseline.  No chest pain, shortness of breath, headache, vision changes.  Pain managed in ED.  Pt given CAM boot while in ED and advised to follow-up with foot and ankle orthopedic specialist for reevaluation continue medical management.  Conservative therapy recommended and discussed, including continued use of tramadol and RICE therapy.  Would not recommend NSAIDs due to patient's chronic renal failure on dialysis.  Patient in NAD and in good condition at time of discharge.  Disposition: After consideration the patient's encounter today, I do not feel today's workup suggests  an emergent condition requiring admission or immediate intervention beyond what has been performed at this time.  Safe for discharge; instructed to return immediately for worsening symptoms, change in symptoms or any other concerns.  I have reviewed the patients home medicines and have made adjustments as needed.  Discussed course of treatment with the patient, whom demonstrated understanding.  Patient in agreement and has no further questions.     This chart was dictated using voice recognition software.  Despite best efforts to proofread, errors can occur which can change the documentation meaning.   {Document critical care time when appropriate:1} {Document review of labs and clinical decision tools ie heart score, Chads2Vasc2 etc:1}  {Document your independent review of radiology images, and any outside records:1} {Document your discussion with family members, caretakers, and with consultants:1} {Document social determinants of health affecting pt's care:1} {Document your decision making why or why not admission, treatments were needed:1} Final Clinical Impression(s) / ED Diagnoses Final diagnoses:  Acute right ankle pain    Rx / DC Orders ED Discharge Orders     None

## 2021-10-09 NOTE — ED Triage Notes (Signed)
Pt reports mechanical fall yesterday with right outer ankle pain.  Tramadol and robaxin with no relief. Ice made pain worse

## 2021-10-09 NOTE — ED Provider Triage Note (Addendum)
Emergency Medicine Provider Triage Evaluation Note  Kirk [name] , a 61 y.o. male  was evaluated in triage.  Pt complains of acute right ankle pain.  Fell yesterday, landed on the left knee and twisted the right ankle.  States he is unconcerned of the knee.  However when he finished dialysis today, noted increasing pain of the right ankle when "the fluid was drained off".  Has tried tramadol and Robaxin, however without relief.  Denies fever or chills.  Denies numbness or tingling of the affected extremity.  Review of Systems  Positive:  Negative: See above  Physical Exam  BP (!) 184/78 (BP Location: Right Arm)   Pulse 68   Temp 99.1 F (37.3 C) (Oral)   Resp 16   Ht '5\' 7"'$  (1.702 m)   Wt 93 kg   SpO2 99%   BMI 32.11 kg/m  Gen:   Awake, no distress   Resp:  Normal effort  MSK:   Moves extremities without difficulty with the exception of the right ankle, due to elicited tenderness Other:  Right ankle with tenderness over the lateral malleolus.  Still able to wiggle toes.  Sensation and blood flow appears grossly intact.  Medical Decision Making  Medically screening exam initiated at 6:07 PM.  Appropriate orders placed.  CHETAN MEHRING was informed that the remainder of the evaluation will be completed by another provider, this initial triage assessment does not replace that evaluation, and the importance of remaining in the ED until their evaluation is complete.     Prince Rome, PA-C 16/38/46 [id number]    Prince Rome, PA-C 35/70/17 [id number]    Prince Rome, PA-C 03/00/92 2212

## 2021-10-11 DIAGNOSIS — N186 End stage renal disease: Secondary | ICD-10-CM | POA: Diagnosis not present

## 2021-10-11 DIAGNOSIS — Z992 Dependence on renal dialysis: Secondary | ICD-10-CM | POA: Diagnosis not present

## 2021-10-11 DIAGNOSIS — N2581 Secondary hyperparathyroidism of renal origin: Secondary | ICD-10-CM | POA: Diagnosis not present

## 2021-10-11 DIAGNOSIS — D509 Iron deficiency anemia, unspecified: Secondary | ICD-10-CM | POA: Diagnosis not present

## 2021-10-11 DIAGNOSIS — D631 Anemia in chronic kidney disease: Secondary | ICD-10-CM | POA: Diagnosis not present

## 2021-10-13 DIAGNOSIS — N2581 Secondary hyperparathyroidism of renal origin: Secondary | ICD-10-CM | POA: Diagnosis not present

## 2021-10-13 DIAGNOSIS — D509 Iron deficiency anemia, unspecified: Secondary | ICD-10-CM | POA: Diagnosis not present

## 2021-10-13 DIAGNOSIS — N186 End stage renal disease: Secondary | ICD-10-CM | POA: Diagnosis not present

## 2021-10-13 DIAGNOSIS — Z992 Dependence on renal dialysis: Secondary | ICD-10-CM | POA: Diagnosis not present

## 2021-10-13 DIAGNOSIS — D631 Anemia in chronic kidney disease: Secondary | ICD-10-CM | POA: Diagnosis not present

## 2021-10-16 DIAGNOSIS — Z992 Dependence on renal dialysis: Secondary | ICD-10-CM | POA: Diagnosis not present

## 2021-10-16 DIAGNOSIS — N2581 Secondary hyperparathyroidism of renal origin: Secondary | ICD-10-CM | POA: Diagnosis not present

## 2021-10-16 DIAGNOSIS — D631 Anemia in chronic kidney disease: Secondary | ICD-10-CM | POA: Diagnosis not present

## 2021-10-16 DIAGNOSIS — N186 End stage renal disease: Secondary | ICD-10-CM | POA: Diagnosis not present

## 2021-10-16 DIAGNOSIS — D509 Iron deficiency anemia, unspecified: Secondary | ICD-10-CM | POA: Diagnosis not present

## 2021-10-17 ENCOUNTER — Telehealth: Payer: Self-pay

## 2021-10-17 NOTE — Telephone Encounter (Signed)
     Patient  visit on 9/18  at [facility] you been able to follow up with your primary care physician? yes  The patient was or was not able to obtain any needed medicine or equipment. yes  Are there diet recommendations that you are having difficulty following?na  Patient expresses understanding of discharge instructions and education provided has no other needs at this time. yes     [facility]  [phone] [address], [place], [place] [address] Phone: [phone] Email: [email or web address]

## 2021-10-18 DIAGNOSIS — D509 Iron deficiency anemia, unspecified: Secondary | ICD-10-CM | POA: Diagnosis not present

## 2021-10-18 DIAGNOSIS — N186 End stage renal disease: Secondary | ICD-10-CM | POA: Diagnosis not present

## 2021-10-18 DIAGNOSIS — N2581 Secondary hyperparathyroidism of renal origin: Secondary | ICD-10-CM | POA: Diagnosis not present

## 2021-10-18 DIAGNOSIS — D631 Anemia in chronic kidney disease: Secondary | ICD-10-CM | POA: Diagnosis not present

## 2021-10-18 DIAGNOSIS — Z992 Dependence on renal dialysis: Secondary | ICD-10-CM | POA: Diagnosis not present

## 2021-10-19 ENCOUNTER — Encounter: Payer: Self-pay | Admitting: Family

## 2021-10-19 ENCOUNTER — Ambulatory Visit (INDEPENDENT_AMBULATORY_CARE_PROVIDER_SITE_OTHER): Payer: Medicare Other | Admitting: Family

## 2021-10-19 VITALS — BP 140/80 | HR 67 | Temp 97.8°F | Resp 18 | Ht 67.0 in | Wt 207.6 lb

## 2021-10-19 DIAGNOSIS — E782 Mixed hyperlipidemia: Secondary | ICD-10-CM | POA: Diagnosis not present

## 2021-10-19 DIAGNOSIS — M25571 Pain in right ankle and joints of right foot: Secondary | ICD-10-CM

## 2021-10-19 DIAGNOSIS — N186 End stage renal disease: Secondary | ICD-10-CM | POA: Diagnosis not present

## 2021-10-19 DIAGNOSIS — I120 Hypertensive chronic kidney disease with stage 5 chronic kidney disease or end stage renal disease: Secondary | ICD-10-CM | POA: Diagnosis not present

## 2021-10-19 MED ORDER — PRAVASTATIN SODIUM 40 MG PO TABS: ORAL_TABLET | ORAL | 2 refills | Status: DC

## 2021-10-19 MED ORDER — AMLODIPINE BESYLATE 10 MG PO TABS
10.0000 mg | ORAL_TABLET | Freq: Every day | ORAL | 3 refills | Status: DC
Start: 2021-10-19 — End: 2021-11-30

## 2021-10-19 NOTE — Progress Notes (Signed)
Provider: Marlowe Sax FNP-C  Marissa Lowrey, Nelda Bucks, NP  Patient Care Team: Gionna Polak, Nelda Bucks, NP as PCP - General (Family Medicine) [name], MD as PCP - Cardiology (Cardiology) Jovita Kussmaul, MD as Consulting Physician (General Surgery) [name], MD as Consulting Physician (Pulmonary Disease) Estanislado Emms, MD (Inactive) as Consulting Physician (Nephrology) Elam Dutch, MD (Inactive) as Consulting Physician (Vascular Surgery) [name], MD as Consulting Physician (Cardiology) Milus Banister, MD as Attending Physician (Gastroenterology) Jamal Maes, MD as Consulting Physician (Nephrology)  Extended Emergency Contact Information Primary Emergency Contact: [name] Address: [address]          [place], [place] [address] Johnnette Litter of [place] Phone: [phone] Mobile Phone: [phone] Relation: Spouse  Code Status:  Full Code  Goals of care: Advanced Directive information    10/19/2021    1:46 PM  Advanced Directives  Does Patient Have a Medical Advance Directive? No  Would patient like information on creating a medical advance directive? No - Patient declined     Chief Complaint  Patient presents with   Hospitalization Follow-up    Follow up ED 10/09/2021.    HPI:  Pt is a 61 y.o. male seen today for an acute visit for ED follow up on 10/09/2021 for right  ankle pain after sustaining a fall at home landed on left knee and twisted right ankle.states tripped on his blanket when getting up.He had lateral malleolus  tenderness and mild to minimal swelling on the ankle.Had complete ROM with mild tenderness noted.Neurovascular was intact.Right Frontal, oblique, and lateral X-ray views of the right ankle showed  no acute fracture, subluxation, or dislocation. Small inferior calcaneal spur and Diffuse vascular calcifications was noted. Soft tissues were otherwise unremarkable. He rates pain 8/10 on scale of 10.On Tramadol and  Robaxin. Wife states were told to  follow up with PCP to be referred to Foot and Ankle specialist.  Has been wearing CAM boot.  He denies any weakness,swelling or redness on ankle or fever/chills.     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)    Anxiety    Asthma    Asthma    moderate persistent   Carpal tunnel syndrome    Cellulitis and abscess of trunk    Cholelithiasis 07/13/2014   Chronic headaches    Debility, unspecified    Dermatophytosis of the body    Dysrhythmia    history of   Edema    End stage renal disease on dialysis ([place])    "MWF; [facility]" (10/21/2014)   Essential hypertension, benign    GERD (gastroesophageal reflux disease)    Gout, unspecified    HTN (hypertension)    Hypertrophy of prostate without urinary obstruction and other lower urinary tract symptoms (LUTS)    Hypotension, unspecified    Impotence of organic origin    Insomnia, unspecified    Kidney replaced by transplant    Localization-related (focal) (partial) epilepsy and epileptic syndromes with complex partial seizures, without mention of intractable epilepsy    12-15-19- Wife states he has NEVER had a seizure    Lumbago    Memory loss    OSA on CPAP    Other and unspecified hyperlipidemia    controlled /managed per wife    Other chronic  nonalcoholic liver disease    Other malaise and fatigue    Other nonspecific abnormal serum enzyme levels    Pain in joint, lower leg    Pain in joint, upper arm    Pneumonia "several times"   Renal dialysis status(V45.11) 02/05/2010   restarted 01/02/13 ofter renal trransplant failure   Secondary hyperparathyroidism (of renal origin)    Shortness of breath    Sleep apnea    wears cpap    Tension headache    Unspecified constipation    Unspecified essential hypertension     Unspecified hereditary and idiopathic peripheral neuropathy    Unspecified vitamin D deficiency    Past Surgical History:  Procedure Laterality Date   AV FISTULA PLACEMENT Left ?2010   "forearm; at [facility]"   [place]  03/21/2011   CHOLECYSTECTOMY N/A 10/21/2014   Procedure: LAPAROSCOPIC CHOLECYSTECTOMY WITH INTRAOPERATIVE CHOLANGIOGRAM;  Surgeon: Autumn Messing III, MD;  Location: [facility];  Service: General;  Laterality: N/A;   COLONOSCOPY     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   KIDNEY TRANSPLANT  08/17/2011   [place]  10/21/2014   w/IOC   LEFT HEART CATHETERIZATION WITH CORONARY ANGIOGRAM N/A 03/21/2011   Procedure: LEFT HEART CATHETERIZATION WITH CORONARY ANGIOGRAM;  Surgeon: [name], MD;  Location: [facility] CATH LAB;  Service: Cardiovascular;  Laterality: N/A;   NEPHRECTOMY  08/2013   removed transplaned kidney   POLYPECTOMY     POSTERIOR FUSION CERVICAL SPINE  06/25/2012   for spinal stenosis   VASECTOMY  2010    Allergies  Allergen Reactions   Codeine Nausea And Vomiting   Hydrocodone-Acetaminophen Nausea And Vomiting    Outpatient Encounter Medications as of 10/19/2021  Medication Sig   acetaminophen (TYLENOL) 500 MG tablet Take 1 tablet (500 mg total) by mouth every 6 (six) hours as needed for moderate pain.   albuterol (PROVENTIL HFA;VENTOLIN HFA) 108 (90 Base) MCG/ACT inhaler Inhale 2 puffs into the lungs every 6 (six) hours as needed for wheezing or shortness of breath.   aspirin EC 81 MG tablet Take 81 mg by mouth daily.   b complex-vitamin c-folic acid (NEPHRO-VITE) 0.8 MG TABS tablet TAKE 1 TABLET BY MOUTH EVERY DAY   buPROPion (WELLBUTRIN XL) 150 MG 24 hr tablet Take by mouth as needed.   calcitRIOL (ROCALTROL) 0.5 MCG capsule Take 2 capsules (1 mcg total) by mouth every Monday, Wednesday, and Friday with hemodialysis.   Cholecalciferol (VITAMIN D3) 25 MCG (1000 UT) CAPS  Take 4,000 Units by mouth daily.   diazepam (VALIUM) 2 MG tablet Take one pill one hour prior to mri and repeat just prior if  needed   diphenhydrAMINE (BENADRYL) 25 MG tablet Take 50 mg by mouth at bedtime.   doxazosin (CARDURA) 4 MG tablet Take 1 tablet (4 mg total) by mouth daily.   ethyl chloride spray Apply topically as needed.   folic acid in sodium chloride 0.9 % 50 mL Inject into the vein. And B-Complex.   guaiFENesin (MUCINEX) 600 MG 12 hr tablet Take 1,200 mg by mouth 2 (two) times daily.   hydrALAZINE (APRESOLINE) 100 MG tablet TAKE 1 TABLET EVERY MORNINGAND AT BEDTIME.   ipratropium-albuterol (DUONEB) 0.5-2.5 (3) MG/3ML SOLN Take 3 mLs by nebulization every 4 (four) hours as needed (Shortness of breath).   isosorbide mononitrate (IMDUR) 30 MG 24 hr tablet TAKE 1 TABLET DAILY   lidocaine-prilocaine (EMLA)  cream Apply 1 application topically See admin instructions. Apply 1 to 2 hours prior to dialysis on Mondays, Wednesdays, and Fridays. Cover with saran wrap.   methocarbamol (ROBAXIN) 750 MG tablet Take 1 tablet (750 mg total) by mouth in the morning, at noon, in the evening, and at bedtime.   metoprolol tartrate (LOPRESSOR) 100 MG tablet TAKE 1 TABLET BY MOUTH TWICE A DAY   omeprazole (PRILOSEC) 20 MG capsule TAKE 1 CAPSULE TWICE DAILY BEFORE A MEAL   ondansetron (ZOFRAN ODT) 4 MG disintegrating tablet Take 1 tablet (4 mg total) by mouth every 8 (eight) hours as needed for nausea or vomiting.   PRESCRIPTION MEDICATION See admin instructions. CPAP- At bedtime and during any time of rest   RENVELA 800 MG tablet Take 800-2,400 mg by mouth See admin instructions. Take 2,400 mg by mouth three times a day with meals and 800 mg with each snack   traMADol (ULTRAM) 50 MG tablet Take 1 tablet (50 mg total) by mouth daily as needed.   traZODone (DESYREL) 50 MG tablet TAKE 1/2 TO 1 TABLET BY MOUTH AT BEDTIME AS NEEDED FOR SLEEP   vitamin C (ASCORBIC ACID) 250 MG tablet Take 1 tablet (250 mg total)  by mouth daily.   [DISCONTINUED] amLODipine (NORVASC) 10 MG tablet TAKE 1 TABLET BY MOUTH EVERY DAY   [DISCONTINUED] pravastatin (PRAVACHOL) 40 MG tablet TAKE 1 TABLET BY MOUTH EVERY DAY IN THE EVENING   amLODipine (NORVASC) 10 MG tablet Take 1 tablet (10 mg total) by mouth daily.   pravastatin (PRAVACHOL) 40 MG tablet TAKE 1 TABLET BY MOUTH EVERY DAY IN THE EVENING   No facility-administered encounter medications on file as of 10/19/2021.    Review of Systems  Constitutional:  Negative for appetite change, chills, fatigue, fever and unexpected weight change.  HENT:  Negative for congestion, ear discharge, ear pain, facial swelling, hearing loss, nosebleeds, postnasal drip, rhinorrhea, sinus pressure, sinus pain, sneezing, sore throat, tinnitus and trouble swallowing.   Eyes:  Negative for pain, discharge, redness, itching and visual disturbance.  Respiratory:  Negative for cough, chest tightness, shortness of breath and wheezing.   Cardiovascular:  Negative for chest pain, palpitations and leg swelling.  Gastrointestinal:  Negative for abdominal distention, abdominal pain, constipation, diarrhea, nausea and vomiting.  Musculoskeletal:  Positive for arthralgias, gait problem and neck pain. Negative for back pain, joint swelling, myalgias and neck stiffness.       Right ankle pain   Skin:  Negative for color change, pallor, rash and wound.  Neurological:  Negative for dizziness, syncope, speech difficulty, weakness, light-headedness, numbness and headaches.  Hematological:  Does not bruise/bleed easily.  Psychiatric/Behavioral:  Negative for agitation, behavioral problems, confusion, hallucinations, self-injury, sleep disturbance and suicidal ideas. The patient is not nervous/anxious.     Immunization History  Administered Date(s) Administered   Influenza Whole 11/01/2010   Influenza, Seasonal, Injecte, Preservative Fre 10/29/2012   Influenza-Unspecified 11/21/2011, 11/05/2012, 09/22/2013,  09/22/2017, 11/23/2019, 10/28/2020, 12/04/2020   Moderna Sars-Covid-2 Vaccination 04/06/2019, 05/07/2019, 03/31/2020   PPD Test 12/26/2012   Pneumococcal Polysaccharide-23 11/01/2010, 10/29/2012   Pneumococcal-Unspecified 11/05/2012   Tdap 01/22/2006, 06/08/2021   Pertinent  Health Maintenance Due  Topic Date Due   INFLUENZA VACCINE  10/23/2021 (Originally 08/22/2021)   HEMOGLOBIN A1C  11/25/2021   COLONOSCOPY (Pts 45-86yr Insurance coverage will need to be confirmed)  12/29/2026   FOOT EXAM  Discontinued   OPHTHALMOLOGY EXAM  Discontinued      07/04/2021    3:29 PM 08/24/2021  10:00 AM 09/12/2021   11:21 AM 10/09/2021    5:59 PM 10/19/2021    1:45 PM  Fall Risk  Falls in the past year? 0  0  1  Was there an injury with Fall? 0  0  1  Fall Risk Category Calculator 0  0  2  Fall Risk Category Low  Low  Moderate  Patient Fall Risk Level Low fall risk Low fall risk Low fall risk Low fall risk Moderate fall risk  Patient at Risk for Falls Due to No Fall Risks  No Fall Risks  Impaired balance/gait;Impaired mobility  Fall risk Follow up Falls evaluation completed  Falls evaluation completed  Falls evaluation completed;Education provided;Falls prevention discussed   Functional Status Survey:    Vitals:   10/19/21 1343  BP: (!) 140/80  Pulse: 67  Resp: 18  Temp: 97.8 F (36.6 C)  SpO2: 98%  Weight: 207 lb 9.6 oz (94.2 kg)  Height: '5\' 7"'$  (1.702 m)   Body mass index is 32.51 kg/m. Physical Exam Vitals reviewed.  Constitutional:      General: He is not in acute distress.    Appearance: Normal appearance. He is normal weight. He is not ill-appearing or diaphoretic.  HENT:     Mouth/Throat:     Mouth: Mucous membranes are moist.     Pharynx: Oropharynx is clear. No oropharyngeal exudate or posterior oropharyngeal erythema.  Eyes:     General: No scleral icterus.       Right eye: No discharge.        Left eye: No discharge.     Conjunctiva/sclera: Conjunctivae normal.      Pupils: Pupils are equal, round, and reactive to light.  Neck:     Vascular: No carotid bruit.  Cardiovascular:     Rate and Rhythm: Normal rate and regular rhythm.     Pulses: Normal pulses.     Heart sounds: Normal heart sounds. No murmur heard.    No friction rub. No gallop.  Pulmonary:     Effort: Pulmonary effort is normal. No respiratory distress.     Breath sounds: Normal breath sounds. No wheezing, rhonchi or rales.  Chest:     Chest wall: No tenderness.  Abdominal:     General: Bowel sounds are normal. There is no distension.     Palpations: Abdomen is soft. There is no mass.     Tenderness: There is no abdominal tenderness. There is no right CVA tenderness, left CVA tenderness, guarding or rebound.  Musculoskeletal:        General: No swelling.     Cervical back: Normal range of motion. No rigidity or tenderness.     Right lower leg: No edema.     Left lower leg: No edema.     Right ankle: No swelling or ecchymosis. Tenderness present over the lateral malleolus. Normal range of motion.     Left ankle: Normal.  Lymphadenopathy:     Cervical: No cervical adenopathy.  Skin:    General: Skin is warm and dry.     Coloration: Skin is not pale.     Findings: No bruising, erythema, lesion or rash.  Neurological:     Mental Status: He is alert and oriented to person, place, and time.     Cranial Nerves: No cranial nerve deficit.     Sensory: No sensory deficit.     Motor: No weakness.     Coordination: Coordination normal.     Gait: Gait normal.  Psychiatric:        Mood and Affect: Mood normal.        Speech: Speech normal.        Behavior: Behavior normal.        Thought Content: Thought content normal.        Judgment: Judgment normal.     Labs reviewed: Recent Labs    01/09/21 2251 01/10/21 0345 01/11/21 0345 03/21/21 0818 05/16/21 1611 08/01/21 1035  NA  --  137   < > 138 141 141  K  --  3.6   < > 4.5 4.3 3.7  CL  --  95*   < > 103 100 102  CO2  --  29    < > 21* 30 28  GLUCOSE  --  132*   < > 102* 126* 119*  BUN  --  18   < > 62* 35* 25*  CREATININE  --  10.25*   < > 15.97* 11.65* 8.82*  CALCIUM  --  8.9   < > 9.2 9.9 9.8  MG 2.1  --   --   --   --   --   PHOS  --  4.1  --   --   --   --    < > = values in this interval not displayed.   Recent Labs    01/11/21 0345 03/21/21 0818 05/16/21 1611  AST 20 12* 11*  ALT '18 7 9  '$ ALKPHOS 84 55 57  BILITOT 0.8 0.9 0.4  PROT 6.6 6.6 7.1  ALBUMIN 3.4* 3.3* 4.1   Recent Labs    05/16/21 1611 08/01/21 1035 08/24/21 1005  WBC 3.1* 3.0* 4.5  NEUTROABS 1.9 2.0 3.1  HGB 11.0* 9.6* 10.0*  HCT 34.6* 29.2* 30.1*  MCV 100.6* 95.5 95.3  PLT 109* 91.0* 86*   Lab Results  Component Value Date   TSH 1.38 03/03/2020   Lab Results  Component Value Date   HGBA1C 4.8 05/25/2021   Lab Results  Component Value Date   CHOL 91 05/25/2021   HDL 31 (L) 05/25/2021   LDLCALC 42 05/25/2021   TRIG 95 05/25/2021   CHOLHDL 2.9 05/25/2021    Significant Diagnostic Results in last 30 days:  DG Ankle Complete Right  Result Date: 10/09/2021 CLINICAL DATA:  [place] yesterday, lateral right ankle pain EXAM: RIGHT ANKLE - COMPLETE 3+ VIEW COMPARISON:  None Available. FINDINGS: Frontal, oblique, and lateral views of the right ankle are obtained. No acute fracture, subluxation, or dislocation. Small inferior calcaneal spur. Diffuse vascular calcifications. Soft tissues are otherwise unremarkable. IMPRESSION: 1. No acute displaced fracture. Electronically Signed   By: Randa Ngo M.D.   On: 10/09/2021 18:24    Assessment/Plan 1. HLD (hyperlipidemia) LDL at goal  - continue on Pravastatin  - pravastatin (PRAVACHOL) 40 MG tablet; TAKE 1 TABLET BY MOUTH EVERY DAY IN THE EVENING  Dispense: 90 tablet; Refill: 2  2. Acute left ankle pain S/p fall evaluated in ED no fracture or dislocation but still tender to palpation on lateral malleolus - continue on tramadol for pain allergic to Codeine   - Ambulatory  referral to Podiatry  3. Benign hypertension with ESRD (end-stage renal disease) ([employer]) B/p well stable  - continue to monitor   - AmLODipine (NORVASC) 10 MG tablet; Take 1 tablet (10 mg total) by mouth daily.  Dispense: 90 tablet; Refill: 3    Family/ staff Communication: Reviewed plan of care with patient and wife verbalized understanding  Labs/tests ordered: None   Next Appointment: Return if symptoms worsen or fail to improve.   Sandrea Hughs, NP

## 2021-10-20 DIAGNOSIS — D631 Anemia in chronic kidney disease: Secondary | ICD-10-CM | POA: Diagnosis not present

## 2021-10-20 DIAGNOSIS — D509 Iron deficiency anemia, unspecified: Secondary | ICD-10-CM | POA: Diagnosis not present

## 2021-10-20 DIAGNOSIS — N2581 Secondary hyperparathyroidism of renal origin: Secondary | ICD-10-CM | POA: Diagnosis not present

## 2021-10-20 DIAGNOSIS — Z992 Dependence on renal dialysis: Secondary | ICD-10-CM | POA: Diagnosis not present

## 2021-10-20 DIAGNOSIS — N186 End stage renal disease: Secondary | ICD-10-CM | POA: Diagnosis not present

## 2021-10-22 DIAGNOSIS — Z992 Dependence on renal dialysis: Secondary | ICD-10-CM | POA: Diagnosis not present

## 2021-10-22 DIAGNOSIS — T8612 Kidney transplant failure: Secondary | ICD-10-CM | POA: Diagnosis not present

## 2021-10-22 DIAGNOSIS — N186 End stage renal disease: Secondary | ICD-10-CM | POA: Diagnosis not present

## 2021-10-23 DIAGNOSIS — N2581 Secondary hyperparathyroidism of renal origin: Secondary | ICD-10-CM | POA: Diagnosis not present

## 2021-10-23 DIAGNOSIS — D631 Anemia in chronic kidney disease: Secondary | ICD-10-CM | POA: Diagnosis not present

## 2021-10-23 DIAGNOSIS — N186 End stage renal disease: Secondary | ICD-10-CM | POA: Diagnosis not present

## 2021-10-23 DIAGNOSIS — Z992 Dependence on renal dialysis: Secondary | ICD-10-CM | POA: Diagnosis not present

## 2021-10-25 DIAGNOSIS — N186 End stage renal disease: Secondary | ICD-10-CM | POA: Diagnosis not present

## 2021-10-25 DIAGNOSIS — N2581 Secondary hyperparathyroidism of renal origin: Secondary | ICD-10-CM | POA: Diagnosis not present

## 2021-10-25 DIAGNOSIS — D631 Anemia in chronic kidney disease: Secondary | ICD-10-CM | POA: Diagnosis not present

## 2021-10-25 DIAGNOSIS — Z992 Dependence on renal dialysis: Secondary | ICD-10-CM | POA: Diagnosis not present

## 2021-10-27 DIAGNOSIS — N186 End stage renal disease: Secondary | ICD-10-CM | POA: Diagnosis not present

## 2021-10-27 DIAGNOSIS — Z992 Dependence on renal dialysis: Secondary | ICD-10-CM | POA: Diagnosis not present

## 2021-10-27 DIAGNOSIS — D631 Anemia in chronic kidney disease: Secondary | ICD-10-CM | POA: Diagnosis not present

## 2021-10-27 DIAGNOSIS — N2581 Secondary hyperparathyroidism of renal origin: Secondary | ICD-10-CM | POA: Diagnosis not present

## 2021-10-28 ENCOUNTER — Other Ambulatory Visit: Payer: Self-pay | Admitting: Family

## 2021-10-30 ENCOUNTER — Telehealth: Payer: Self-pay

## 2021-10-30 DIAGNOSIS — N2581 Secondary hyperparathyroidism of renal origin: Secondary | ICD-10-CM | POA: Diagnosis not present

## 2021-10-30 DIAGNOSIS — N186 End stage renal disease: Secondary | ICD-10-CM | POA: Diagnosis not present

## 2021-10-30 DIAGNOSIS — D631 Anemia in chronic kidney disease: Secondary | ICD-10-CM | POA: Diagnosis not present

## 2021-10-30 DIAGNOSIS — Z992 Dependence on renal dialysis: Secondary | ICD-10-CM | POA: Diagnosis not present

## 2021-10-30 NOTE — Telephone Encounter (Unsigned)
Tess with [facility] called requesting H&P on patient. She said last note will be ok to fax. Last note printed and faxed to [phone]

## 2021-10-30 NOTE — Telephone Encounter (Signed)
Pharmacy requested refill.  Pended Rx and sent to [facility] for approval due to HIGH ALERT Warning.  

## 2021-10-31 ENCOUNTER — Telehealth: Payer: Self-pay

## 2021-10-31 DIAGNOSIS — R0989 Other specified symptoms and signs involving the circulatory and respiratory systems: Secondary | ICD-10-CM | POA: Diagnosis not present

## 2021-10-31 DIAGNOSIS — R09A2 Foreign body sensation, throat: Secondary | ICD-10-CM | POA: Diagnosis not present

## 2021-10-31 DIAGNOSIS — J385 Laryngeal spasm: Secondary | ICD-10-CM | POA: Diagnosis not present

## 2021-10-31 DIAGNOSIS — R490 Dysphonia: Secondary | ICD-10-CM | POA: Diagnosis not present

## 2021-10-31 NOTE — Telephone Encounter (Signed)
   Telephone encounter was:  Unsuccessful.  10/31/2021 Name: Kirk [name] MRN: [id number] DOB: November 10, 1960  Unsuccessful outbound call made today to assist with:   heat and dental  Outreach Attempt:  1st Attempt  A HIPAA compliant voice message was left requesting a return call.  Instructed patient to call back    Luis Lopez, [place] Management  [phone], [place], [place] [address] Phone: [phone] Email: [email or web address]

## 2021-11-01 DIAGNOSIS — N186 End stage renal disease: Secondary | ICD-10-CM | POA: Diagnosis not present

## 2021-11-01 DIAGNOSIS — Z992 Dependence on renal dialysis: Secondary | ICD-10-CM | POA: Diagnosis not present

## 2021-11-01 DIAGNOSIS — D631 Anemia in chronic kidney disease: Secondary | ICD-10-CM | POA: Diagnosis not present

## 2021-11-01 DIAGNOSIS — N2581 Secondary hyperparathyroidism of renal origin: Secondary | ICD-10-CM | POA: Diagnosis not present

## 2021-11-02 ENCOUNTER — Telehealth: Payer: Self-pay

## 2021-11-02 DIAGNOSIS — G5603 Carpal tunnel syndrome, bilateral upper limbs: Secondary | ICD-10-CM | POA: Diagnosis not present

## 2021-11-02 NOTE — Telephone Encounter (Signed)
   Telephone encounter was:  Successful.  11/02/2021 Name: Kirk [name] MRN: [id number] DOB: 04/14/1960  [name] is a 61 y.o. year old male who is a primary care patient of Ngetich, Dinah C, NP . The community resource team was consulted for assistance with Food Insecurity, Home Modifications, and Financial Difficulties related to financial strain  Care guide performed the following interventions: Patient provided with information about care guide support team and interviewed to confirm resource needs.Patient needs assistance with dentures, heating unit repair and heating bill assistance, food, and tax assistance. I have put referrals in [place] care360 and mailed resources to patient  Follow Up Plan:  No further follow up planned at this time. The patient has been provided with needed resources.   [place], Care Management  [phone] [address], [place], [place] [address] Phone: [phone] Email: [email or web address]

## 2021-11-03 DIAGNOSIS — Z992 Dependence on renal dialysis: Secondary | ICD-10-CM | POA: Diagnosis not present

## 2021-11-03 DIAGNOSIS — N186 End stage renal disease: Secondary | ICD-10-CM | POA: Diagnosis not present

## 2021-11-03 DIAGNOSIS — N2581 Secondary hyperparathyroidism of renal origin: Secondary | ICD-10-CM | POA: Diagnosis not present

## 2021-11-03 DIAGNOSIS — D631 Anemia in chronic kidney disease: Secondary | ICD-10-CM | POA: Diagnosis not present

## 2021-11-06 DIAGNOSIS — D631 Anemia in chronic kidney disease: Secondary | ICD-10-CM | POA: Diagnosis not present

## 2021-11-06 DIAGNOSIS — N2581 Secondary hyperparathyroidism of renal origin: Secondary | ICD-10-CM | POA: Diagnosis not present

## 2021-11-06 DIAGNOSIS — Z992 Dependence on renal dialysis: Secondary | ICD-10-CM | POA: Diagnosis not present

## 2021-11-06 DIAGNOSIS — N186 End stage renal disease: Secondary | ICD-10-CM | POA: Diagnosis not present

## 2021-11-07 ENCOUNTER — Inpatient Hospital Stay (HOSPITAL_COMMUNITY)
Admission: EM | Admit: 2021-11-07 | Discharge: 2021-11-21 | DRG: 064 | Disposition: A | Discharge status: Rehabilitation Facility | Payer: Medicare Other | Attending: Internal Medicine | Admitting: Internal Medicine

## 2021-11-07 ENCOUNTER — Other Ambulatory Visit: Payer: Self-pay

## 2021-11-07 ENCOUNTER — Emergency Department (HOSPITAL_COMMUNITY): Payer: Medicare Other

## 2021-11-07 ENCOUNTER — Encounter (HOSPITAL_COMMUNITY): Payer: Self-pay

## 2021-11-07 DIAGNOSIS — D696 Thrombocytopenia, unspecified: Secondary | ICD-10-CM | POA: Diagnosis not present

## 2021-11-07 DIAGNOSIS — Z9181 History of falling: Secondary | ICD-10-CM

## 2021-11-07 DIAGNOSIS — S065XAS Traumatic subdural hemorrhage with loss of consciousness status unknown, sequela: Secondary | ICD-10-CM | POA: Diagnosis not present

## 2021-11-07 DIAGNOSIS — R7881 Bacteremia: Secondary | ICD-10-CM | POA: Diagnosis not present

## 2021-11-07 DIAGNOSIS — H9191 Unspecified hearing loss, right ear: Secondary | ICD-10-CM | POA: Diagnosis present

## 2021-11-07 DIAGNOSIS — J45909 Unspecified asthma, uncomplicated: Secondary | ICD-10-CM | POA: Diagnosis present

## 2021-11-07 DIAGNOSIS — K219 Gastro-esophageal reflux disease without esophagitis: Secondary | ICD-10-CM | POA: Diagnosis present

## 2021-11-07 DIAGNOSIS — N059 Unspecified nephritic syndrome with unspecified morphologic changes: Secondary | ICD-10-CM | POA: Diagnosis present

## 2021-11-07 DIAGNOSIS — N186 End stage renal disease: Secondary | ICD-10-CM | POA: Diagnosis present

## 2021-11-07 DIAGNOSIS — G40209 Localization-related (focal) (partial) symptomatic epilepsy and epileptic syndromes with complex partial seizures, not intractable, without status epilepticus: Secondary | ICD-10-CM | POA: Diagnosis not present

## 2021-11-07 DIAGNOSIS — R34 Anuria and oliguria: Secondary | ICD-10-CM | POA: Diagnosis present

## 2021-11-07 DIAGNOSIS — R413 Other amnesia: Secondary | ICD-10-CM | POA: Diagnosis present

## 2021-11-07 DIAGNOSIS — E785 Hyperlipidemia, unspecified: Secondary | ICD-10-CM | POA: Diagnosis present

## 2021-11-07 DIAGNOSIS — E669 Obesity, unspecified: Secondary | ICD-10-CM | POA: Diagnosis present

## 2021-11-07 DIAGNOSIS — G44309 Post-traumatic headache, unspecified, not intractable: Secondary | ICD-10-CM | POA: Diagnosis not present

## 2021-11-07 DIAGNOSIS — F419 Anxiety disorder, unspecified: Secondary | ICD-10-CM | POA: Diagnosis present

## 2021-11-07 DIAGNOSIS — S065X1S Traumatic subdural hemorrhage with loss of consciousness of 30 minutes or less, sequela: Secondary | ICD-10-CM | POA: Diagnosis not present

## 2021-11-07 DIAGNOSIS — D631 Anemia in chronic kidney disease: Secondary | ICD-10-CM | POA: Diagnosis present

## 2021-11-07 DIAGNOSIS — R509 Fever, unspecified: Secondary | ICD-10-CM | POA: Diagnosis not present

## 2021-11-07 DIAGNOSIS — T8612 Kidney transplant failure: Secondary | ICD-10-CM | POA: Diagnosis not present

## 2021-11-07 DIAGNOSIS — I739 Peripheral vascular disease, unspecified: Secondary | ICD-10-CM | POA: Diagnosis not present

## 2021-11-07 DIAGNOSIS — Z79899 Other long term (current) drug therapy: Secondary | ICD-10-CM

## 2021-11-07 DIAGNOSIS — F0781 Postconcussional syndrome: Secondary | ICD-10-CM | POA: Diagnosis present

## 2021-11-07 DIAGNOSIS — R404 Transient alteration of awareness: Secondary | ICD-10-CM | POA: Diagnosis not present

## 2021-11-07 DIAGNOSIS — E1122 Type 2 diabetes mellitus with diabetic chronic kidney disease: Secondary | ICD-10-CM | POA: Diagnosis present

## 2021-11-07 DIAGNOSIS — Y830 Surgical operation with transplant of whole organ as the cause of abnormal reaction of the patient, or of later complication, without mention of misadventure at the time of the procedure: Secondary | ICD-10-CM | POA: Diagnosis present

## 2021-11-07 DIAGNOSIS — R569 Unspecified convulsions: Secondary | ICD-10-CM

## 2021-11-07 DIAGNOSIS — J811 Chronic pulmonary edema: Secondary | ICD-10-CM | POA: Diagnosis not present

## 2021-11-07 DIAGNOSIS — A419 Sepsis, unspecified organism: Secondary | ICD-10-CM | POA: Diagnosis not present

## 2021-11-07 DIAGNOSIS — Z992 Dependence on renal dialysis: Secondary | ICD-10-CM

## 2021-11-07 DIAGNOSIS — G4089 Other seizures: Secondary | ICD-10-CM | POA: Diagnosis present

## 2021-11-07 DIAGNOSIS — R4701 Aphasia: Secondary | ICD-10-CM | POA: Diagnosis not present

## 2021-11-07 DIAGNOSIS — I6523 Occlusion and stenosis of bilateral carotid arteries: Secondary | ICD-10-CM | POA: Diagnosis not present

## 2021-11-07 DIAGNOSIS — S065X0A Traumatic subdural hemorrhage without loss of consciousness, initial encounter: Secondary | ICD-10-CM | POA: Diagnosis not present

## 2021-11-07 DIAGNOSIS — Z9049 Acquired absence of other specified parts of digestive tract: Secondary | ICD-10-CM

## 2021-11-07 DIAGNOSIS — I5032 Chronic diastolic (congestive) heart failure: Secondary | ICD-10-CM | POA: Diagnosis present

## 2021-11-07 DIAGNOSIS — Z87891 Personal history of nicotine dependence: Secondary | ICD-10-CM

## 2021-11-07 DIAGNOSIS — Z885 Allergy status to narcotic agent status: Secondary | ICD-10-CM

## 2021-11-07 DIAGNOSIS — I10 Essential (primary) hypertension: Secondary | ICD-10-CM | POA: Diagnosis present

## 2021-11-07 DIAGNOSIS — G43811 Other migraine, intractable, with status migrainosus: Secondary | ICD-10-CM | POA: Diagnosis not present

## 2021-11-07 DIAGNOSIS — R531 Weakness: Secondary | ICD-10-CM | POA: Diagnosis present

## 2021-11-07 DIAGNOSIS — I132 Hypertensive heart and chronic kidney disease with heart failure and with stage 5 chronic kidney disease, or end stage renal disease: Secondary | ICD-10-CM | POA: Diagnosis not present

## 2021-11-07 DIAGNOSIS — R519 Headache, unspecified: Secondary | ICD-10-CM | POA: Diagnosis not present

## 2021-11-07 DIAGNOSIS — I350 Nonrheumatic aortic (valve) stenosis: Secondary | ICD-10-CM | POA: Diagnosis present

## 2021-11-07 DIAGNOSIS — N2581 Secondary hyperparathyroidism of renal origin: Secondary | ICD-10-CM | POA: Diagnosis present

## 2021-11-07 DIAGNOSIS — R000 Tachycardia, unspecified: Secondary | ICD-10-CM | POA: Diagnosis not present

## 2021-11-07 DIAGNOSIS — Z905 Acquired absence of kidney: Secondary | ICD-10-CM

## 2021-11-07 DIAGNOSIS — D638 Anemia in other chronic diseases classified elsewhere: Secondary | ICD-10-CM | POA: Diagnosis not present

## 2021-11-07 DIAGNOSIS — Q8781 Alport syndrome: Secondary | ICD-10-CM

## 2021-11-07 DIAGNOSIS — T8619 Other complication of kidney transplant: Secondary | ICD-10-CM | POA: Diagnosis present

## 2021-11-07 DIAGNOSIS — E876 Hypokalemia: Secondary | ICD-10-CM | POA: Diagnosis not present

## 2021-11-07 DIAGNOSIS — H53462 Homonymous bilateral field defects, left side: Secondary | ICD-10-CM | POA: Diagnosis not present

## 2021-11-07 DIAGNOSIS — G4700 Insomnia, unspecified: Secondary | ICD-10-CM | POA: Diagnosis present

## 2021-11-07 DIAGNOSIS — I6201 Nontraumatic acute subdural hemorrhage: Principal | ICD-10-CM | POA: Diagnosis present

## 2021-11-07 DIAGNOSIS — I480 Paroxysmal atrial fibrillation: Secondary | ICD-10-CM | POA: Diagnosis present

## 2021-11-07 DIAGNOSIS — Z6832 Body mass index (BMI) 32.0-32.9, adult: Secondary | ICD-10-CM | POA: Diagnosis not present

## 2021-11-07 DIAGNOSIS — Z940 Kidney transplant status: Secondary | ICD-10-CM | POA: Diagnosis not present

## 2021-11-07 DIAGNOSIS — I6200 Nontraumatic subdural hemorrhage, unspecified: Secondary | ICD-10-CM | POA: Diagnosis not present

## 2021-11-07 DIAGNOSIS — G4733 Obstructive sleep apnea (adult) (pediatric): Secondary | ICD-10-CM | POA: Diagnosis not present

## 2021-11-07 DIAGNOSIS — S066X0A Traumatic subarachnoid hemorrhage without loss of consciousness, initial encounter: Secondary | ICD-10-CM | POA: Diagnosis not present

## 2021-11-07 DIAGNOSIS — S065X2D Traumatic subdural hemorrhage with loss of consciousness of 31 minutes to 59 minutes, subsequent encounter: Secondary | ICD-10-CM | POA: Diagnosis not present

## 2021-11-07 DIAGNOSIS — S069X0S Unspecified intracranial injury without loss of consciousness, sequela: Secondary | ICD-10-CM | POA: Diagnosis not present

## 2021-11-07 DIAGNOSIS — Z981 Arthrodesis status: Secondary | ICD-10-CM

## 2021-11-07 DIAGNOSIS — S065X1D Traumatic subdural hemorrhage with loss of consciousness of 30 minutes or less, subsequent encounter: Secondary | ICD-10-CM | POA: Diagnosis not present

## 2021-11-07 DIAGNOSIS — R471 Dysarthria and anarthria: Secondary | ICD-10-CM | POA: Diagnosis not present

## 2021-11-07 DIAGNOSIS — E871 Hypo-osmolality and hyponatremia: Secondary | ICD-10-CM | POA: Diagnosis not present

## 2021-11-07 DIAGNOSIS — R112 Nausea with vomiting, unspecified: Secondary | ICD-10-CM | POA: Diagnosis not present

## 2021-11-07 DIAGNOSIS — R414 Neurologic neglect syndrome: Secondary | ICD-10-CM | POA: Diagnosis not present

## 2021-11-07 DIAGNOSIS — G43909 Migraine, unspecified, not intractable, without status migrainosus: Secondary | ICD-10-CM | POA: Diagnosis present

## 2021-11-07 DIAGNOSIS — R410 Disorientation, unspecified: Secondary | ICD-10-CM | POA: Diagnosis not present

## 2021-11-07 DIAGNOSIS — R296 Repeated falls: Secondary | ICD-10-CM | POA: Diagnosis present

## 2021-11-07 DIAGNOSIS — I120 Hypertensive chronic kidney disease with stage 5 chronic kidney disease or end stage renal disease: Secondary | ICD-10-CM | POA: Diagnosis not present

## 2021-11-07 DIAGNOSIS — R4781 Slurred speech: Secondary | ICD-10-CM | POA: Diagnosis not present

## 2021-11-07 DIAGNOSIS — S065XAA Traumatic subdural hemorrhage with loss of consciousness status unknown, initial encounter: Secondary | ICD-10-CM | POA: Diagnosis not present

## 2021-11-07 DIAGNOSIS — Z6833 Body mass index (BMI) 33.0-33.9, adult: Secondary | ICD-10-CM

## 2021-11-07 DIAGNOSIS — I6503 Occlusion and stenosis of bilateral vertebral arteries: Secondary | ICD-10-CM | POA: Diagnosis not present

## 2021-11-07 DIAGNOSIS — I5022 Chronic systolic (congestive) heart failure: Secondary | ICD-10-CM | POA: Diagnosis not present

## 2021-11-07 DIAGNOSIS — I4891 Unspecified atrial fibrillation: Secondary | ICD-10-CM | POA: Diagnosis not present

## 2021-11-07 DIAGNOSIS — N400 Enlarged prostate without lower urinary tract symptoms: Secondary | ICD-10-CM | POA: Diagnosis present

## 2021-11-07 DIAGNOSIS — M109 Gout, unspecified: Secondary | ICD-10-CM | POA: Diagnosis present

## 2021-11-07 LAB — I-STAT CHEM 8, ED
BUN: 36 mg/dL — ABNORMAL HIGH (ref 6–20)
Calcium, Ion: 1.1 mmol/L — ABNORMAL LOW (ref 1.15–1.40)
Chloride: 95 mmol/L — ABNORMAL LOW (ref 98–111)
Creatinine, Ser: 11.8 mg/dL — ABNORMAL HIGH (ref 0.61–1.24)
Glucose, Bld: 145 mg/dL — ABNORMAL HIGH (ref 70–99)
HCT: 33 % — ABNORMAL LOW (ref 39.0–52.0)
Hemoglobin: 11.2 g/dL — ABNORMAL LOW (ref 13.0–17.0)
Potassium: 3.5 mmol/L (ref 3.5–5.1)
Sodium: 136 mmol/L (ref 135–145)
TCO2: 29 mmol/L (ref 22–32)

## 2021-11-07 LAB — CBC WITH DIFFERENTIAL/PLATELET
Abs Immature Granulocytes: 0.06 10*3/uL (ref 0.00–0.07)
Basophils Absolute: 0 10*3/uL (ref 0.0–0.1)
Basophils Relative: 1 %
Eosinophils Absolute: 0 10*3/uL (ref 0.0–0.5)
Eosinophils Relative: 1 %
HCT: 30 % — ABNORMAL LOW (ref 39.0–52.0)
Hemoglobin: 10.3 g/dL — ABNORMAL LOW (ref 13.0–17.0)
Immature Granulocytes: 1 %
Lymphocytes Relative: 8 %
Lymphs Abs: 0.4 10*3/uL — ABNORMAL LOW (ref 0.7–4.0)
MCH: 31.7 pg (ref 26.0–34.0)
MCHC: 34.3 g/dL (ref 30.0–36.0)
MCV: 92.3 fL (ref 80.0–100.0)
Monocytes Absolute: 0.4 10*3/uL (ref 0.1–1.0)
Monocytes Relative: 9 %
Neutro Abs: 3.6 10*3/uL (ref 1.7–7.7)
Neutrophils Relative %: 80 %
Platelets: 87 10*3/uL — ABNORMAL LOW (ref 150–400)
RBC: 3.25 MIL/uL — ABNORMAL LOW (ref 4.22–5.81)
RDW: 13.2 % (ref 11.5–15.5)
WBC: 4.6 10*3/uL (ref 4.0–10.5)
nRBC: 0 % (ref 0.0–0.2)

## 2021-11-07 LAB — COMPREHENSIVE METABOLIC PANEL
ALT: 11 U/L (ref 0–44)
AST: 11 U/L — ABNORMAL LOW (ref 15–41)
Albumin: 3.2 g/dL — ABNORMAL LOW (ref 3.5–5.0)
Alkaline Phosphatase: 55 U/L (ref 38–126)
Anion gap: 15 (ref 5–15)
BUN: 40 mg/dL — ABNORMAL HIGH (ref 6–20)
CO2: 26 mmol/L (ref 22–32)
Calcium: 9.3 mg/dL (ref 8.9–10.3)
Chloride: 97 mmol/L — ABNORMAL LOW (ref 98–111)
Creatinine, Ser: 11.66 mg/dL — ABNORMAL HIGH (ref 0.61–1.24)
GFR, Estimated: 5 mL/min — ABNORMAL LOW (ref >60)
Glucose, Bld: 149 mg/dL — ABNORMAL HIGH (ref 70–99)
Potassium: 3.5 mmol/L (ref 3.5–5.1)
Sodium: 138 mmol/L (ref 135–145)
Total Bilirubin: 0.7 mg/dL (ref 0.3–1.2)
Total Protein: 6.5 g/dL (ref 6.5–8.1)

## 2021-11-07 MED ORDER — LABETALOL HCL 5 MG/ML IV SOLN
10.0000 mg | Freq: Once | INTRAVENOUS | Status: AC
  Administered 2021-11-08: 10 mg via INTRAVENOUS
  Filled 2021-11-07: qty 4

## 2021-11-07 MED ORDER — LEVETIRACETAM IN NACL 1000 MG/100ML IV SOLN
1000.0000 mg | Freq: Once | INTRAVENOUS | Status: AC
  Administered 2021-11-07: 1000 mg via INTRAVENOUS
  Filled 2021-11-07: qty 100

## 2021-11-07 NOTE — ED Notes (Signed)
Patient transported to CT 

## 2021-11-07 NOTE — ED Provider Notes (Signed)
[facility] EMERGENCY DEPARTMENT Provider Note   CSN: [id number] Arrival date & time: 11/07/21  2042     History {Add pertinent medical, surgical, social history, OB history to HPI:1} Chief Complaint  Patient presents with   Seizures    Kirk [name] is a 61 y.o. male.  HPI     Home Medications Prior to Admission medications   Medication Sig Start Date End Date Taking? Authorizing Provider  acetaminophen (TYLENOL) 500 MG tablet Take 1 tablet (500 mg total) by mouth every 6 (six) hours as needed for moderate pain. 09/12/14   Samuella Cota, MD  albuterol (PROVENTIL HFA;VENTOLIN HFA) 108 (90 Base) MCG/ACT inhaler Inhale 2 puffs into the lungs every 6 (six) hours as needed for wheezing or shortness of breath. 06/05/17   Patrecia Pour, MD  amLODipine (NORVASC) 10 MG tablet Take 1 tablet (10 mg total) by mouth daily. 10/19/21   Ngetich, Dinah C, NP  aspirin EC 81 MG tablet Take 81 mg by mouth daily.    [provider]  b complex-vitamin c-folic acid (NEPHRO-VITE) 0.8 MG TABS tablet TAKE 1 TABLET BY MOUTH EVERY DAY 02/08/20   Reed, Tiffany L, DO  buPROPion (WELLBUTRIN XL) 150 MG 24 hr tablet Take by mouth as needed. 02/25/20   [provider]  calcitRIOL (ROCALTROL) 0.5 MCG capsule Take 2 capsules (1 mcg total) by mouth every Monday, Wednesday, and Friday with hemodialysis. 06/04/18   Black, Lezlie Octave, NP  Cholecalciferol (VITAMIN D3) 25 MCG (1000 UT) CAPS Take 4,000 Units by mouth daily.    [provider]  diazepam (VALIUM) 2 MG tablet Take one pill one hour prior to mri and repeat just prior if  needed 08/29/21   Aundra Dubin, PA-C  diphenhydrAMINE (BENADRYL) 25 MG tablet Take 50 mg by mouth at bedtime.    [provider]  doxazosin (CARDURA) 4 MG tablet Take 1 tablet (4 mg total) by mouth daily. 07/04/21   Ngetich, Dinah C, NP  ethyl chloride spray Apply topically as needed.    [provider]  folic acid in sodium  chloride 0.9 % 50 mL Inject into the vein. And B-Complex.    [provider]  guaiFENesin (MUCINEX) 600 MG 12 hr tablet Take 1,200 mg by mouth 2 (two) times daily.    [provider]  hydrALAZINE (APRESOLINE) 100 MG tablet TAKE 1 TABLET EVERY MORNINGAND AT BEDTIME. 08/22/21   Hilty, Nadean Corwin, MD  ipratropium-albuterol (DUONEB) 0.5-2.5 (3) MG/3ML SOLN Take 3 mLs by nebulization every 4 (four) hours as needed (Shortness of breath). 12/05/18   Mariel Aloe, MD  isosorbide mononitrate (IMDUR) 30 MG 24 hr tablet TAKE 1 TABLET DAILY 11/10/19   Hilty, Nadean Corwin, MD  lidocaine-prilocaine (EMLA) cream Apply 1 application topically See admin instructions. Apply 1 to 2 hours prior to dialysis on Mondays, Wednesdays, and Fridays. Cover with saran wrap. 11/18/18   [provider]  methocarbamol (ROBAXIN) 750 MG tablet Take 1 tablet (750 mg total) by mouth in the morning, at noon, in the evening, and at bedtime. 09/12/21   Ngetich, Dinah C, NP  metoprolol tartrate (LOPRESSOR) 100 MG tablet TAKE 1 TABLET BY MOUTH TWICE A DAY 09/21/20   Hilty, Nadean Corwin, MD  omeprazole (PRILOSEC) 20 MG capsule TAKE 1 CAPSULE TWICE DAILY BEFORE A MEAL 02/09/21   Ngetich, Dinah C, NP  ondansetron (ZOFRAN ODT) 4 MG disintegrating tablet Take 1 tablet (4 mg total) by mouth every 8 (eight)  hours as needed for nausea or vomiting. 01/11/21   Bonnielee Haff, MD  pravastatin (PRAVACHOL) 40 MG tablet TAKE 1 TABLET BY MOUTH EVERY DAY IN THE EVENING 10/19/21   Ngetich, Nelda Bucks, NP  PRESCRIPTION MEDICATION See admin instructions. CPAP- At bedtime and during any time of rest    [provider]  RENVELA 800 MG tablet Take 800-2,400 mg by mouth See admin instructions. Take 2,400 mg by mouth three times a day with meals and 800 mg with each snack 07/06/19   [provider]  traMADol (ULTRAM) 50 MG tablet Take 1 tablet (50 mg total) by mouth daily as needed. 09/12/21   Ngetich, Dinah C, NP  traZODone (DESYREL)  50 MG tablet TAKE 1/2 TO 1 TABLET BY MOUTH AT BEDTIME AS NEEDED FOR SLEEP 10/30/21   Ngetich, Dinah C, NP  vitamin C (ASCORBIC ACID) 250 MG tablet Take 1 tablet (250 mg total) by mouth daily. 07/04/21   Ngetich, Nelda Bucks, NP      Allergies    Codeine and Hydrocodone-acetaminophen    Review of Systems   Review of Systems  Physical Exam Updated Vital Signs BP (!) 192/81 (BP Location: Right Arm)   Pulse 81   Temp 98.2 F (36.8 C) (Oral)   Resp 17   Ht '5\' 7"'$  (1.702 m)   Wt 90.7 kg   SpO2 95%   BMI 31.32 kg/m  Physical Exam  ED Results / Procedures / Treatments   Labs (all labs ordered are listed, but only abnormal results are displayed) Labs Reviewed - No data to display  EKG None  Radiology No results found.  Procedures Procedures  {Document cardiac monitor, telemetry assessment procedure when appropriate:1}  Medications Ordered in ED Medications - No data to display  ED Course/ Medical Decision Making/ A&P                           Medical Decision Making  ***  {Document critical care time when appropriate:1} {Document review of labs and clinical decision tools ie heart score, Chads2Vasc2 etc:1}  {Document your independent review of radiology images, and any outside records:1} {Document your discussion with family members, caretakers, and with consultants:1} {Document social determinants of health affecting pt's care:1} {Document your decision making why or why not admission, treatments were needed:1} Final Clinical Impression(s) / ED Diagnoses Final diagnoses:  None    Rx / DC Orders ED Discharge Orders     None

## 2021-11-07 NOTE — ED Triage Notes (Signed)
Arrives [facility] from home. Pt sound by wife displaying seizure like activity lasting ~2ish minutes while laying there with CPAP on.   Sts no history of seizures. Left arm restriction for dialysis.

## 2021-11-08 ENCOUNTER — Observation Stay (HOSPITAL_COMMUNITY): Payer: Medicare Other

## 2021-11-08 DIAGNOSIS — R569 Unspecified convulsions: Secondary | ICD-10-CM

## 2021-11-08 DIAGNOSIS — R34 Anuria and oliguria: Secondary | ICD-10-CM | POA: Diagnosis present

## 2021-11-08 DIAGNOSIS — I120 Hypertensive chronic kidney disease with stage 5 chronic kidney disease or end stage renal disease: Secondary | ICD-10-CM | POA: Diagnosis not present

## 2021-11-08 DIAGNOSIS — I480 Paroxysmal atrial fibrillation: Secondary | ICD-10-CM | POA: Diagnosis present

## 2021-11-08 DIAGNOSIS — E876 Hypokalemia: Secondary | ICD-10-CM | POA: Diagnosis present

## 2021-11-08 DIAGNOSIS — Z992 Dependence on renal dialysis: Secondary | ICD-10-CM

## 2021-11-08 DIAGNOSIS — K219 Gastro-esophageal reflux disease without esophagitis: Secondary | ICD-10-CM | POA: Diagnosis present

## 2021-11-08 DIAGNOSIS — T8619 Other complication of kidney transplant: Secondary | ICD-10-CM | POA: Diagnosis present

## 2021-11-08 DIAGNOSIS — E871 Hypo-osmolality and hyponatremia: Secondary | ICD-10-CM | POA: Diagnosis present

## 2021-11-08 DIAGNOSIS — Z79899 Other long term (current) drug therapy: Secondary | ICD-10-CM | POA: Diagnosis not present

## 2021-11-08 DIAGNOSIS — I10 Essential (primary) hypertension: Secondary | ICD-10-CM

## 2021-11-08 DIAGNOSIS — D696 Thrombocytopenia, unspecified: Secondary | ICD-10-CM

## 2021-11-08 DIAGNOSIS — S065X1D Traumatic subdural hemorrhage with loss of consciousness of 30 minutes or less, subsequent encounter: Secondary | ICD-10-CM | POA: Diagnosis not present

## 2021-11-08 DIAGNOSIS — G4733 Obstructive sleep apnea (adult) (pediatric): Secondary | ICD-10-CM | POA: Diagnosis not present

## 2021-11-08 DIAGNOSIS — E669 Obesity, unspecified: Secondary | ICD-10-CM | POA: Diagnosis present

## 2021-11-08 DIAGNOSIS — J811 Chronic pulmonary edema: Secondary | ICD-10-CM | POA: Diagnosis not present

## 2021-11-08 DIAGNOSIS — I739 Peripheral vascular disease, unspecified: Secondary | ICD-10-CM | POA: Diagnosis not present

## 2021-11-08 DIAGNOSIS — I6503 Occlusion and stenosis of bilateral vertebral arteries: Secondary | ICD-10-CM | POA: Diagnosis not present

## 2021-11-08 DIAGNOSIS — G40209 Localization-related (focal) (partial) symptomatic epilepsy and epileptic syndromes with complex partial seizures, not intractable, without status epilepticus: Secondary | ICD-10-CM | POA: Diagnosis present

## 2021-11-08 DIAGNOSIS — R7881 Bacteremia: Secondary | ICD-10-CM | POA: Diagnosis not present

## 2021-11-08 DIAGNOSIS — I132 Hypertensive heart and chronic kidney disease with heart failure and with stage 5 chronic kidney disease, or end stage renal disease: Secondary | ICD-10-CM | POA: Diagnosis present

## 2021-11-08 DIAGNOSIS — S065XAA Traumatic subdural hemorrhage with loss of consciousness status unknown, initial encounter: Secondary | ICD-10-CM | POA: Diagnosis not present

## 2021-11-08 DIAGNOSIS — I5022 Chronic systolic (congestive) heart failure: Secondary | ICD-10-CM | POA: Diagnosis not present

## 2021-11-08 DIAGNOSIS — Z940 Kidney transplant status: Secondary | ICD-10-CM | POA: Diagnosis not present

## 2021-11-08 DIAGNOSIS — N2581 Secondary hyperparathyroidism of renal origin: Secondary | ICD-10-CM | POA: Diagnosis present

## 2021-11-08 DIAGNOSIS — R414 Neurologic neglect syndrome: Secondary | ICD-10-CM | POA: Diagnosis present

## 2021-11-08 DIAGNOSIS — I6201 Nontraumatic acute subdural hemorrhage: Secondary | ICD-10-CM | POA: Diagnosis present

## 2021-11-08 DIAGNOSIS — E785 Hyperlipidemia, unspecified: Secondary | ICD-10-CM | POA: Diagnosis present

## 2021-11-08 DIAGNOSIS — G4700 Insomnia, unspecified: Secondary | ICD-10-CM | POA: Diagnosis present

## 2021-11-08 DIAGNOSIS — Z87891 Personal history of nicotine dependence: Secondary | ICD-10-CM | POA: Diagnosis not present

## 2021-11-08 DIAGNOSIS — I5032 Chronic diastolic (congestive) heart failure: Secondary | ICD-10-CM | POA: Diagnosis present

## 2021-11-08 DIAGNOSIS — Q8781 Alport syndrome: Secondary | ICD-10-CM

## 2021-11-08 DIAGNOSIS — J45909 Unspecified asthma, uncomplicated: Secondary | ICD-10-CM | POA: Diagnosis present

## 2021-11-08 DIAGNOSIS — G4089 Other seizures: Secondary | ICD-10-CM | POA: Diagnosis present

## 2021-11-08 DIAGNOSIS — S069X0S Unspecified intracranial injury without loss of consciousness, sequela: Secondary | ICD-10-CM | POA: Diagnosis not present

## 2021-11-08 DIAGNOSIS — R4781 Slurred speech: Secondary | ICD-10-CM | POA: Diagnosis not present

## 2021-11-08 DIAGNOSIS — G44309 Post-traumatic headache, unspecified, not intractable: Secondary | ICD-10-CM | POA: Diagnosis not present

## 2021-11-08 DIAGNOSIS — I350 Nonrheumatic aortic (valve) stenosis: Secondary | ICD-10-CM | POA: Diagnosis present

## 2021-11-08 DIAGNOSIS — R531 Weakness: Secondary | ICD-10-CM | POA: Diagnosis present

## 2021-11-08 DIAGNOSIS — R296 Repeated falls: Secondary | ICD-10-CM | POA: Diagnosis present

## 2021-11-08 DIAGNOSIS — A419 Sepsis, unspecified organism: Secondary | ICD-10-CM | POA: Diagnosis not present

## 2021-11-08 DIAGNOSIS — Z9181 History of falling: Secondary | ICD-10-CM | POA: Diagnosis not present

## 2021-11-08 DIAGNOSIS — S066X0A Traumatic subarachnoid hemorrhage without loss of consciousness, initial encounter: Secondary | ICD-10-CM | POA: Diagnosis not present

## 2021-11-08 DIAGNOSIS — Z6832 Body mass index (BMI) 32.0-32.9, adult: Secondary | ICD-10-CM | POA: Diagnosis not present

## 2021-11-08 DIAGNOSIS — S065XAS Traumatic subdural hemorrhage with loss of consciousness status unknown, sequela: Secondary | ICD-10-CM | POA: Diagnosis not present

## 2021-11-08 DIAGNOSIS — D638 Anemia in other chronic diseases classified elsewhere: Secondary | ICD-10-CM | POA: Diagnosis not present

## 2021-11-08 DIAGNOSIS — S065X2D Traumatic subdural hemorrhage with loss of consciousness of 31 minutes to 59 minutes, subsequent encounter: Secondary | ICD-10-CM | POA: Diagnosis not present

## 2021-11-08 DIAGNOSIS — I4891 Unspecified atrial fibrillation: Secondary | ICD-10-CM | POA: Diagnosis not present

## 2021-11-08 DIAGNOSIS — T8612 Kidney transplant failure: Secondary | ICD-10-CM | POA: Diagnosis present

## 2021-11-08 DIAGNOSIS — R509 Fever, unspecified: Secondary | ICD-10-CM | POA: Diagnosis not present

## 2021-11-08 DIAGNOSIS — R413 Other amnesia: Secondary | ICD-10-CM | POA: Diagnosis present

## 2021-11-08 DIAGNOSIS — N186 End stage renal disease: Secondary | ICD-10-CM

## 2021-11-08 DIAGNOSIS — I6523 Occlusion and stenosis of bilateral carotid arteries: Secondary | ICD-10-CM | POA: Diagnosis not present

## 2021-11-08 DIAGNOSIS — E1122 Type 2 diabetes mellitus with diabetic chronic kidney disease: Secondary | ICD-10-CM | POA: Diagnosis present

## 2021-11-08 DIAGNOSIS — G43811 Other migraine, intractable, with status migrainosus: Secondary | ICD-10-CM | POA: Diagnosis not present

## 2021-11-08 DIAGNOSIS — I6200 Nontraumatic subdural hemorrhage, unspecified: Secondary | ICD-10-CM | POA: Diagnosis not present

## 2021-11-08 DIAGNOSIS — Y830 Surgical operation with transplant of whole organ as the cause of abnormal reaction of the patient, or of later complication, without mention of misadventure at the time of the procedure: Secondary | ICD-10-CM | POA: Diagnosis present

## 2021-11-08 DIAGNOSIS — F0781 Postconcussional syndrome: Secondary | ICD-10-CM | POA: Diagnosis present

## 2021-11-08 DIAGNOSIS — D631 Anemia in chronic kidney disease: Secondary | ICD-10-CM | POA: Diagnosis present

## 2021-11-08 DIAGNOSIS — S065X1S Traumatic subdural hemorrhage with loss of consciousness of 30 minutes or less, sequela: Secondary | ICD-10-CM | POA: Diagnosis not present

## 2021-11-08 LAB — BASIC METABOLIC PANEL
Anion gap: 18 — ABNORMAL HIGH (ref 5–15)
BUN: 41 mg/dL — ABNORMAL HIGH (ref 6–20)
CO2: 25 mmol/L (ref 22–32)
Calcium: 10.2 mg/dL (ref 8.9–10.3)
Chloride: 95 mmol/L — ABNORMAL LOW (ref 98–111)
Creatinine, Ser: 12.02 mg/dL — ABNORMAL HIGH (ref 0.61–1.24)
GFR, Estimated: 4 mL/min — ABNORMAL LOW (ref >60)
Glucose, Bld: 117 mg/dL — ABNORMAL HIGH (ref 70–99)
Potassium: 3.3 mmol/L — ABNORMAL LOW (ref 3.5–5.1)
Sodium: 138 mmol/L (ref 135–145)

## 2021-11-08 LAB — CBC
HCT: 30.9 % — ABNORMAL LOW (ref 39.0–52.0)
Hemoglobin: 10.1 g/dL — ABNORMAL LOW (ref 13.0–17.0)
MCH: 30.9 pg (ref 26.0–34.0)
MCHC: 32.7 g/dL (ref 30.0–36.0)
MCV: 94.5 fL (ref 80.0–100.0)
Platelets: 90 10*3/uL — ABNORMAL LOW (ref 150–400)
RBC: 3.27 MIL/uL — ABNORMAL LOW (ref 4.22–5.81)
RDW: 13.3 % (ref 11.5–15.5)
WBC: 4.5 10*3/uL (ref 4.0–10.5)
nRBC: 0 % (ref 0.0–0.2)

## 2021-11-08 LAB — HEPATITIS B SURFACE ANTIGEN: Hepatitis B Surface Ag: NONREACTIVE

## 2021-11-08 MED ORDER — LABETALOL HCL 5 MG/ML IV SOLN: 5.0000 mg | INTRAVENOUS | Status: DC | PRN

## 2021-11-08 MED ORDER — ACETAMINOPHEN 325 MG PO TABS
650.0000 mg | ORAL_TABLET | Freq: Four times a day (QID) | ORAL | Status: DC | PRN
  Administered 2021-11-08 – 2021-11-13 (×7): 650 mg via ORAL
  Filled 2021-11-08 (×8): qty 2

## 2021-11-08 MED ORDER — CHLORHEXIDINE GLUCONATE CLOTH 2 % EX PADS: 6.0000 | MEDICATED_PAD | Freq: Every day | CUTANEOUS | Status: DC

## 2021-11-08 MED ORDER — OXYCODONE HCL 5 MG PO TABS
5.0000 mg | ORAL_TABLET | ORAL | Status: DC | PRN
  Administered 2021-11-09 – 2021-11-11 (×5): 5 mg via ORAL
  Filled 2021-11-08 (×5): qty 1

## 2021-11-08 MED ORDER — ISOSORBIDE MONONITRATE ER 30 MG PO TB24
30.0000 mg | ORAL_TABLET | Freq: Every day | ORAL | Status: DC
  Administered 2021-11-08 – 2021-11-20 (×10): 30 mg via ORAL
  Filled 2021-11-08 (×11): qty 1

## 2021-11-08 MED ORDER — PROCHLORPERAZINE EDISYLATE 10 MG/2ML IJ SOLN
10.0000 mg | Freq: Once | INTRAMUSCULAR | Status: AC
  Administered 2021-11-08: 10 mg via INTRAVENOUS
  Filled 2021-11-08: qty 2

## 2021-11-08 MED ORDER — PENTAFLUOROPROP-TETRAFLUOROETH EX AERO: 1.0000 | INHALATION_SPRAY | CUTANEOUS | Status: DC | PRN

## 2021-11-08 MED ORDER — ONDANSETRON HCL 4 MG/2ML IJ SOLN
4.0000 mg | Freq: Four times a day (QID) | INTRAMUSCULAR | Status: DC | PRN
  Administered 2021-11-09 – 2021-11-17 (×9): 4 mg via INTRAVENOUS
  Filled 2021-11-08 (×10): qty 2

## 2021-11-08 MED ORDER — LIDOCAINE-PRILOCAINE 2.5-2.5 % EX CREA: 1.0000 | TOPICAL_CREAM | CUTANEOUS | Status: DC | PRN

## 2021-11-08 MED ORDER — SEVELAMER CARBONATE 800 MG PO TABS: 1600.0000 mg | ORAL_TABLET | ORAL | Status: DC | PRN

## 2021-11-08 MED ORDER — ACETAMINOPHEN 650 MG RE SUPP
650.0000 mg | Freq: Four times a day (QID) | RECTAL | Status: DC | PRN
  Administered 2021-11-15: 650 mg via RECTAL
  Filled 2021-11-08: qty 1

## 2021-11-08 MED ORDER — LIDOCAINE HCL (PF) 1 % IJ SOLN: 5.0000 mL | INTRAMUSCULAR | Status: DC | PRN

## 2021-11-08 MED ORDER — METOPROLOL TARTRATE 50 MG PO TABS
100.0000 mg | ORAL_TABLET | Freq: Two times a day (BID) | ORAL | Status: DC
  Administered 2021-11-08 – 2021-11-21 (×23): 100 mg via ORAL
  Filled 2021-11-08: qty 4
  Filled 2021-11-08 (×16): qty 2
  Filled 2021-11-08: qty 4
  Filled 2021-11-08 (×4): qty 2
  Filled 2021-11-08: qty 4

## 2021-11-08 MED ORDER — TRAZODONE HCL 50 MG PO TABS
50.0000 mg | ORAL_TABLET | Freq: Every evening | ORAL | Status: DC | PRN
  Administered 2021-11-11 – 2021-11-17 (×4): 50 mg via ORAL
  Filled 2021-11-08 (×4): qty 1

## 2021-11-08 MED ORDER — LABETALOL HCL 5 MG/ML IV SOLN
10.0000 mg | Freq: Once | INTRAVENOUS | Status: DC
  Filled 2021-11-08: qty 4

## 2021-11-08 MED ORDER — HYDRALAZINE HCL 20 MG/ML IJ SOLN
10.0000 mg | INTRAMUSCULAR | Status: DC | PRN
  Administered 2021-11-08 – 2021-11-10 (×2): 10 mg via INTRAVENOUS
  Administered 2021-11-11 – 2021-11-15 (×9): 20 mg via INTRAVENOUS
  Filled 2021-11-08 (×13): qty 1

## 2021-11-08 MED ORDER — SODIUM CHLORIDE 0.9 % IV SOLN
250.0000 mg | INTRAVENOUS | Status: DC
  Administered 2021-11-08: 250 mg via INTRAVENOUS
  Filled 2021-11-08 (×3): qty 2.5

## 2021-11-08 MED ORDER — PRAVASTATIN SODIUM 40 MG PO TABS
40.0000 mg | ORAL_TABLET | Freq: Every day | ORAL | Status: DC
  Administered 2021-11-08 – 2021-11-21 (×12): 40 mg via ORAL
  Filled 2021-11-08 (×13): qty 1

## 2021-11-08 MED ORDER — VITAMIN D 25 MCG (1000 UNIT) PO TABS
4000.0000 [IU] | ORAL_TABLET | Freq: Every day | ORAL | Status: DC
  Administered 2021-11-08 – 2021-11-21 (×13): 4000 [IU] via ORAL
  Filled 2021-11-08 (×13): qty 4

## 2021-11-08 MED ORDER — PANTOPRAZOLE SODIUM 40 MG PO TBEC
40.0000 mg | DELAYED_RELEASE_TABLET | Freq: Two times a day (BID) | ORAL | Status: DC
  Administered 2021-11-08 – 2021-11-21 (×22): 40 mg via ORAL
  Filled 2021-11-08 (×22): qty 1

## 2021-11-08 MED ORDER — TRAZODONE HCL 50 MG PO TABS
50.0000 mg | ORAL_TABLET | Freq: Every day | ORAL | Status: DC
  Administered 2021-11-08: 50 mg via ORAL
  Filled 2021-11-08: qty 1

## 2021-11-08 MED ORDER — METHOCARBAMOL 750 MG PO TABS
750.0000 mg | ORAL_TABLET | Freq: Four times a day (QID) | ORAL | Status: DC
  Administered 2021-11-08 – 2021-11-21 (×37): 750 mg via ORAL
  Filled 2021-11-08: qty 2
  Filled 2021-11-08 (×7): qty 1
  Filled 2021-11-08: qty 2
  Filled 2021-11-08 (×14): qty 1
  Filled 2021-11-08: qty 2
  Filled 2021-11-08: qty 1
  Filled 2021-11-08: qty 2
  Filled 2021-11-08 (×12): qty 1

## 2021-11-08 MED ORDER — AMLODIPINE BESYLATE 10 MG PO TABS
10.0000 mg | ORAL_TABLET | Freq: Every day | ORAL | Status: DC
  Administered 2021-11-08 – 2021-11-13 (×6): 10 mg via ORAL
  Filled 2021-11-08 (×2): qty 1
  Filled 2021-11-08: qty 2
  Filled 2021-11-08: qty 1
  Filled 2021-11-08: qty 2
  Filled 2021-11-08: qty 1

## 2021-11-08 MED ORDER — LEVETIRACETAM IN NACL 500 MG/100ML IV SOLN
500.0000 mg | Freq: Every day | INTRAVENOUS | Status: DC
  Administered 2021-11-08 – 2021-11-09 (×2): 500 mg via INTRAVENOUS
  Filled 2021-11-08 (×4): qty 100

## 2021-11-08 MED ORDER — SEVELAMER CARBONATE 800 MG PO TABS
3200.0000 mg | ORAL_TABLET | Freq: Three times a day (TID) | ORAL | Status: DC
  Administered 2021-11-08 – 2021-11-21 (×27): 3200 mg via ORAL
  Filled 2021-11-08 (×28): qty 4

## 2021-11-08 MED ORDER — GUAIFENESIN ER 600 MG PO TB12
1200.0000 mg | ORAL_TABLET | Freq: Two times a day (BID) | ORAL | Status: DC
  Administered 2021-11-08 – 2021-11-21 (×23): 1200 mg via ORAL
  Filled 2021-11-08 (×22): qty 2

## 2021-11-08 MED ORDER — LABETALOL HCL 5 MG/ML IV SOLN
10.0000 mg | Freq: Once | INTRAVENOUS | Status: AC
  Administered 2021-11-08: 10 mg via INTRAVENOUS
  Filled 2021-11-08: qty 4

## 2021-11-08 MED ORDER — ONDANSETRON HCL 4 MG PO TABS
4.0000 mg | ORAL_TABLET | Freq: Four times a day (QID) | ORAL | Status: DC | PRN
  Administered 2021-11-10 – 2021-11-20 (×6): 4 mg via ORAL
  Filled 2021-11-08 (×6): qty 1

## 2021-11-08 MED ORDER — TRAMADOL HCL 50 MG PO TABS
50.0000 mg | ORAL_TABLET | Freq: Every day | ORAL | Status: DC | PRN
  Administered 2021-11-08: 50 mg via ORAL
  Filled 2021-11-08: qty 1

## 2021-11-08 MED ORDER — RENA-VITE PO TABS
1.0000 | ORAL_TABLET | Freq: Every day | ORAL | Status: DC
  Administered 2021-11-08 – 2021-11-20 (×11): 1 via ORAL
  Filled 2021-11-08 (×12): qty 1

## 2021-11-08 MED ORDER — LIDOCAINE-PRILOCAINE 2.5-2.5 % EX CREA: 1.0000 | TOPICAL_CREAM | CUTANEOUS | Status: DC

## 2021-11-08 MED ORDER — DOXAZOSIN MESYLATE 4 MG PO TABS
4.0000 mg | ORAL_TABLET | Freq: Every day | ORAL | Status: DC
  Administered 2021-11-08 – 2021-11-21 (×10): 4 mg via ORAL
  Filled 2021-11-08 (×15): qty 1

## 2021-11-08 MED ORDER — METOCLOPRAMIDE HCL 5 MG/ML IJ SOLN
10.0000 mg | Freq: Once | INTRAMUSCULAR | Status: AC
  Administered 2021-11-08: 10 mg via INTRAVENOUS
  Filled 2021-11-08: qty 2

## 2021-11-08 MED ORDER — CALCITRIOL 0.25 MCG PO CAPS
1.0000 ug | ORAL_CAPSULE | ORAL | Status: DC
  Administered 2021-11-08: 1 ug via ORAL
  Filled 2021-11-08: qty 4
  Filled 2021-11-08: qty 2

## 2021-11-08 NOTE — ED Notes (Signed)
Patient back in room in ED from dialysis at this time

## 2021-11-08 NOTE — Progress Notes (Addendum)
Patient with retroorbital pain on the right, has been coming and going x 3 days. He has a history of migraines. Will give reglan '10mg'$  IV x 1.   [name] Rack, MD [facility] [phone]  If 7pm- 7am, please page neurology on call as listed in [place].

## 2021-11-08 NOTE — Assessment & Plan Note (Addendum)
Ill hold CPAP for tonight, at least until we dont think hes going to seize again.

## 2021-11-08 NOTE — Assessment & Plan Note (Addendum)
Cont home BP meds PRN hydralazine ordered

## 2021-11-08 NOTE — Assessment & Plan Note (Addendum)
New onset seizure in setting of acute SDH. 1. Got 1g keppra load in ED 2. Neurology consulted by EDP, further AEDs per neurology 3. Seizure precautions

## 2021-11-08 NOTE — Consult Note (Addendum)
Reason for Consult:SDH Referring Physician: Gareth Morgan, MD   HPI: Kirk [name] is an 61 y.o. male with a PmHx significant for ESRD in setting of [place] Syndrome on HD MWF, HTN, chronic thrombocytopenia who presented to the emergency department due to seizure-like activity lasting approximately 2 minutes.  Per the patient's wife, he has no history of seizures or any recent head trauma.  Work-up in the ED revealed an acute subdural hematoma.  He reports a history of chronic bilateral upper and bilateral lower extremity weakness s/p cervical surgery in February 2023.  He endorses a headache located behind his right eye with increased pressure sensation that is exacerbated with coughing.  Neurosurgical consultation was requested due to findings of the subdural hematoma.  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)    Anxiety    Asthma    Asthma    moderate persistent   Carpal tunnel syndrome    Cellulitis and abscess of trunk    Cholelithiasis 07/13/2014   Chronic headaches    Debility, unspecified    Dermatophytosis of the body    Dysrhythmia    history of   Edema    End stage renal disease on dialysis [facility])    "MWF; [facility] in [facility]" (10/21/2014)   Essential hypertension, benign    GERD (gastroesophageal reflux disease)    Gout, unspecified    HTN (hypertension)    Hypertrophy of prostate without urinary obstruction and other lower urinary tract symptoms (LUTS)    Hypotension, unspecified    Impotence of organic origin    Insomnia, unspecified    Kidney replaced by transplant    Localization-related (focal) (partial) epilepsy and epileptic syndromes with complex partial seizures, without mention of intractable epilepsy    12-15-19- Wife states he has NEVER had a seizure    Lumbago    Memory loss    OSA on CPAP     Other and unspecified hyperlipidemia    controlled /managed per wife    Other chronic nonalcoholic liver disease    Other malaise and fatigue    Other nonspecific abnormal serum enzyme levels    Pain in joint, lower leg    Pain in joint, upper arm    Pneumonia "several times"   Renal dialysis status(V45.11) 02/05/2010   restarted 01/02/13 ofter renal trransplant failure   Secondary hyperparathyroidism (of renal origin)    Shortness of breath    Sleep apnea    wears cpap    Tension headache    Unspecified constipation    Unspecified essential hypertension    Unspecified hereditary and idiopathic peripheral neuropathy    Unspecified vitamin D deficiency     Past Surgical History:  Procedure Laterality Date   AV FISTULA PLACEMENT Left ?2010   "forearm; at [place]"   [place]  03/21/2011   CHOLECYSTECTOMY N/A 10/21/2014   Procedure: LAPAROSCOPIC CHOLECYSTECTOMY WITH INTRAOPERATIVE CHOLANGIOGRAM;  Surgeon: Autumn Messing III, MD;  Location: [place];  Service: General;  Laterality: N/A;   COLONOSCOPY     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   KIDNEY TRANSPLANT  08/17/2011   [facility]    LAPAROSCOPIC CHOLECYSTECTOMY  10/21/2014   w/IOC   LEFT HEART CATHETERIZATION WITH CORONARY ANGIOGRAM N/A 03/21/2011   Procedure:  LEFT HEART CATHETERIZATION WITH CORONARY ANGIOGRAM;  Surgeon: [name], MD;  Location: [facility] CATH LAB;  Service: Cardiovascular;  Laterality: N/A;   NEPHRECTOMY  08/2013   removed transplaned kidney   POLYPECTOMY     POSTERIOR FUSION CERVICAL SPINE  06/25/2012   for spinal stenosis   VASECTOMY  2010    Family History  Adopted: Yes  Problem Relation Age of Onset   Colon cancer Neg Hx    Esophageal cancer Neg Hx    Rectal cancer Neg Hx    Stomach cancer Neg Hx    Colon polyps Neg Hx     Social History:  reports that he quit smoking about 20 months ago. His smoking use included cigarettes. He started  smoking about 30 years ago. He has a 16.00 pack-year smoking history. He has never used smokeless tobacco. He reports that he does not drink alcohol and does not use drugs.  Allergies:  Allergies  Allergen Reactions   Codeine Nausea And Vomiting   Hydrocodone-Acetaminophen Nausea And Vomiting    Medications: I have reviewed the patient's current medications.  Results for orders placed or performed during the hospital encounter of 11/07/21 (from the past 48 hour(s))  CBC with Differential     Status: Abnormal   Collection Time: 11/07/21  9:44 PM  Result Value Ref Range   WBC 4.6 4.0 - 10.5 K/uL   RBC 3.25 (L) 4.22 - 5.81 MIL/uL   Hemoglobin 10.3 (L) 13.0 - 17.0 g/dL   HCT 30.0 (L) 39.0 - 52.0 %   MCV 92.3 80.0 - 100.0 fL   MCH 31.7 26.0 - 34.0 pg   MCHC 34.3 30.0 - 36.0 g/dL   RDW 13.2 11.5 - 15.5 %   Platelets 87 (L) 150 - 400 K/uL    Comment: Immature Platelet Fraction may be clinically indicated, consider ordering this additional test [id number] REPEATED TO VERIFY PLATELET COUNT CONFIRMED BY SMEAR    nRBC 0.0 0.0 - 0.2 %   Neutrophils Relative % 80 %   Neutro Abs 3.6 1.7 - 7.7 K/uL   Lymphocytes Relative 8 %   Lymphs Abs 0.4 (L) 0.7 - 4.0 K/uL   Monocytes Relative 9 %   Monocytes Absolute 0.4 0.1 - 1.0 K/uL   Eosinophils Relative 1 %   Eosinophils Absolute 0.0 0.0 - 0.5 K/uL   Basophils Relative 1 %   Basophils Absolute 0.0 0.0 - 0.1 K/uL   Immature Granulocytes 1 %   Abs Immature Granulocytes 0.06 0.00 - 0.07 K/uL    Comment: Performed at [facility], [address], [place], [place] [address]  Comprehensive metabolic panel     Status: Abnormal   Collection Time: 11/07/21  9:44 PM  Result Value Ref Range   Sodium 138 135 - 145 mmol/L   Potassium 3.5 3.5 - 5.1 mmol/L   Chloride 97 (L) 98 - 111 mmol/L   CO2 26 22 - 32 mmol/L   Glucose, Bld 149 (H) 70 - 99 mg/dL    Comment: Glucose reference range applies only to samples taken after fasting for at least 8  hours.   BUN 40 (H) 6 - 20 mg/dL   Creatinine, Ser 11.66 (H) 0.61 - 1.24 mg/dL   Calcium 9.3 8.9 - 10.3 mg/dL   Total Protein 6.5 6.5 - 8.1 g/dL   Albumin 3.2 (L) 3.5 - 5.0 g/dL   AST 11 (L) 15 - 41 U/L   ALT 11 0 - 44 U/L  Alkaline Phosphatase 55 38 - 126 U/L   Total Bilirubin 0.7 0.3 - 1.2 mg/dL   GFR, Estimated 5 (L) >60 mL/min    Comment: (NOTE) Calculated using the CKD-EPI Creatinine Equation (2021)    Anion gap 15 5 - 15    Comment: Performed at [facility] [address] [place], [place] [address]  I-stat chem 8, ED (not at [name] or [facility])     Status: Abnormal   Collection Time: 11/07/21  9:50 PM  Result Value Ref Range   Sodium 136 135 - 145 mmol/L   Potassium 3.5 3.5 - 5.1 mmol/L   Chloride 95 (L) 98 - 111 mmol/L   BUN 36 (H) 6 - 20 mg/dL   Creatinine, Ser 11.80 (H) 0.61 - 1.24 mg/dL   Glucose, Bld 145 (H) 70 - 99 mg/dL    Comment: Glucose reference range applies only to samples taken after fasting for at least 8 hours.   Calcium, Ion 1.10 (L) 1.15 - 1.40 mmol/L   TCO2 29 22 - 32 mmol/L   Hemoglobin 11.2 (L) 13.0 - 17.0 g/dL   HCT 33.0 (L) 39.0 - 52.0 %  CBC     Status: Abnormal   Collection Time: 11/08/21  4:15 AM  Result Value Ref Range   WBC 4.5 4.0 - 10.5 K/uL   RBC 3.27 (L) 4.22 - 5.81 MIL/uL   Hemoglobin 10.1 (L) 13.0 - 17.0 g/dL   HCT 30.9 (L) 39.0 - 52.0 %   MCV 94.5 80.0 - 100.0 fL   MCH 30.9 26.0 - 34.0 pg   MCHC 32.7 30.0 - 36.0 g/dL   RDW 13.3 11.5 - 15.5 %   Platelets 90 (L) 150 - 400 K/uL    Comment: REPEATED TO VERIFY   nRBC 0.0 0.0 - 0.2 %    Comment: Performed at [facility], [place] [address], [place], [place] [address]  Basic metabolic panel     Status: Abnormal   Collection Time: 11/08/21  4:15 AM  Result Value Ref Range   Sodium 138 135 - 145 mmol/L   Potassium 3.3 (L) 3.5 - 5.1 mmol/L   Chloride 95 (L) 98 - 111 mmol/L   CO2 25 22 - 32 mmol/L   Glucose, Bld 117 (H) 70 - 99 mg/dL    Comment: Glucose reference range applies  only to samples taken after fasting for at least 8 hours.   BUN 41 (H) 6 - 20 mg/dL   Creatinine, Ser 12.02 (H) 0.61 - 1.24 mg/dL   Calcium 10.2 8.9 - 10.3 mg/dL   GFR, Estimated 4 (L) >60 mL/min    Comment: (NOTE) Calculated using the CKD-EPI Creatinine Equation (2021)    Anion gap 18 (H) 5 - 15    Comment: Performed at [facility] [address] [place], [place] [address]  Hepatitis B surface antigen     Status: None   Collection Time: 11/08/21  6:42 AM  Result Value Ref Range   Hepatitis B Surface Ag NON REACTIVE NON REACTIVE    Comment: Performed at [facility] [address], [place], [place] [address]    CT HEAD WO CONTRAST (5MM)  Result Date: 11/08/2021 CLINICAL DATA:  Follow-up subdural hematoma EXAM: CT HEAD WITHOUT CONTRAST TECHNIQUE: Contiguous axial images were obtained from the base of the skull through the vertex without intravenous contrast. RADIATION DOSE REDUCTION: This exam was performed according to the departmental dose-optimization program which includes automated exposure control,  adjustment of the mA and/or kV according to patient size and/or use of iterative reconstruction technique. COMPARISON:  Yesterday FINDINGS: Brain: Mixed density subdural hematoma along the right cerebral convexity, primarily lateral but also minimally seen inferiorly. In the anterior frontal region maximal thickness is 9 mm. No progression when compared on coronal reformats. Mild right cerebral hemispheric mass effect. No infarct, hydrocephalus, or masslike finding. Vascular: No hyperdense vessel or unexpected calcification. Skull: Normal. Negative for fracture or focal lesion. Sinuses/Orbits: No acute finding. IMPRESSION: Unchanged subdural hematoma along the right cerebral convexity measuring up to 9 mm at the anterior frontal lobe. Electronically Signed   By: Jorje Guild M.D.   On: 11/08/2021 10:12   CT Head Wo Contrast  Result Date: 11/07/2021 CLINICAL DATA:  Headache;  seizure-like activity EXAM: CT HEAD WITHOUT CONTRAST TECHNIQUE: Contiguous axial images were obtained from the base of the skull through the vertex without intravenous contrast. RADIATION DOSE REDUCTION: This exam was performed according to the departmental dose-optimization program which includes automated exposure control, adjustment of the mA and/or kV according to patient size and/or use of iterative reconstruction technique. COMPARISON:  None Available. FINDINGS: Brain: Subdural hematoma overlying the right frontal convexity measuring up to 9 mm in greatest radial diameter. Adjacent mass effect on the right frontal lobe with mild loss of sulcation. 1-2 mm of leftward midline shift at the septum pellucidum. Basal cisterns are patent. Normal caliber of the ventricles. No evidence of acute infarct. Mild generalized cerebral atrophy. Mild chronic microvascular ischemic disease. Vascular: No hyperdense vessel or unexpected calcification. Skull: Normal. Negative for fracture or focal lesion. Sinuses/Orbits: No acute finding. Other: None. IMPRESSION: Acute subdural hematoma overlying the right frontal convexity measuring 9 mm in greatest radial diameter. Mild adjacent mass effect and 1-2 mm of leftward midline shift at the septum pellucidum. These results were called by telephone at the time of interpretation on 11/07/2021 at 10:00 pm to provider [facility] , who verbally acknowledged these results. Electronically Signed   By: Placido Sou M.D.   On: 11/07/2021 22:02    ROS: Per HPI Blood pressure (!) 145/99, pulse 67, temperature 98.1 F (36.7 C), temperature source Oral, resp. rate 20, height '5\' 7"'$  (1.702 m), weight 90.7 kg, SpO2 97 %. Physical exam: A&O x4, speech appropriate and fluent without evidence of aphasia. Able to follow 3 step commands without difficulty.  No dysarthria present. Good attention span, thought content appropriate. Memory and fund of knowledge appear to be appropriate Motor  Strength: The patient's strength was normal in all extremities and pronator drift was absent Motor Exam: grossly normal, normal tone and bulk, no atrophy noted, no ataxia Right: Upper extremity   4/5                                      Left: Upper extremity   4/5             Lower extremity   4/5                                              Lower extremity   4/5 Sensory Exam: grossly normal, Light touch, temperature/pinprick were assessed and were intact throughout Reflexes: symmetric, no pathologic reflexes, No Hoffman's, No clonus, plantars downgoing bilaterally Gait: deferred Coordination: The patient  had normal movements in the hands and feet with no ataxia or dysmetria. Tremor was absent.    Cranial Nerves: I: Smell Not tested  II: Visual acuity  OS: na    OD: na  II: Visual fields Full to confrontation  II: Pupils Equal, round, reactive to light  III,VII: Ptosis None  III,IV,VI: Extraocular muscles  Extraocular movements intact  V: Mastication Normal  V: Facial light touch sensation  Facial sensation intact bilaterally  V,VII: Corneal reflex  Present  VII: Facial muscle function - upper  Facial movement intact bilaterally  VII: Facial muscle function - lower Facial movement intact bilaterally   VIII: Hearing Not tested  IX: Soft palate elevation  Normal, uvula rises symmetrically  IX,X: Gag reflex Present  XI: Trapezius strength  5/5   XI: Sternocleidomastoid strength 5/5  XI: Neck flexion strength  5/5 Chin turning & shoulder shrug intact bilaterally  XII: Tongue strength  Tongue protrusion intact and in midline        Assessment/Plan: 61 y.o. male who presented to the emergency department following seizure-like activity.  CT imaging while in the emergency department revealed an acute subdural hematoma overlying the right frontal convexity measuring approximately 9 mm with mild mass effect and approximately 1 mm of left to right midline shift.  Patient mildly uncooperative  on examination.  Examination did reveal slight weakness in his bilateral upper and lower extremities that is consistent with his reported baseline.  No acute neurosurgical intervention was indicated.  Repeat CT head was performed and revealed stable appearance of the subdural hematoma along the right cerebral convexity.  Marvis Moeller, DNP, AGNP-C Neurosurgery Nurse Practitioner  [facility] [place] [address], [place], [place] [address] P: [phone]    F: [phone]  11/08/2021 12:43 AM

## 2021-11-08 NOTE — ED Notes (Signed)
This RN confirmed with hemodialysis that the patient will come back to room 46

## 2021-11-08 NOTE — Assessment & Plan Note (Addendum)
Apparent spontaneous SDH, no recent trauma to head. 1. NS called by EDP: 1. Recd repeat CT head in AM 2. Neuro checks 1. Pt AAOx3, GCS 15 currently 3. Tele monitor 4. Hold home ASA

## 2021-11-08 NOTE — Consult Note (Addendum)
NEURO HOSPITALIST CONSULT NOTE   Requestig physician: Dr. Billy Fischer  Reason for Consult: Seizure  History obtained from:   Patient and Chart     HPI:                                                                                                                                          Kirk [name] is an 61 y.o. 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 He has no history of seizures. He also noted that he has had a headache, constant over the last few days. He feels like it is behind his right eye and experiences it as severe pressure, with pain worsened with coughing.   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)    Anxiety    Asthma    Asthma    moderate persistent   Carpal tunnel syndrome    Cellulitis and abscess of trunk    Cholelithiasis 07/13/2014   Chronic headaches    Debility, unspecified    Dermatophytosis of the body    Dysrhythmia    history of   Edema    End stage renal disease on dialysis [facility])    "MWF; [facility]" (10/21/2014)   Essential hypertension, benign    GERD (gastroesophageal reflux disease)    Gout, unspecified    HTN (hypertension)    Hypertrophy of prostate without urinary obstruction and other lower urinary tract symptoms (LUTS)    Hypotension, unspecified    Impotence of organic origin    Insomnia, unspecified    Kidney replaced by transplant    Localization-related (focal) (partial) epilepsy and epileptic  syndromes with complex partial seizures, without mention of intractable epilepsy    12-15-19- Wife states he has NEVER had a seizure    Lumbago    Memory loss    OSA on CPAP    Other and unspecified hyperlipidemia    controlled /managed per wife    Other chronic nonalcoholic liver disease    Other malaise and fatigue    Other nonspecific abnormal serum enzyme levels    Pain in joint, lower leg    Pain in joint, upper arm    Pneumonia "several times"   Renal dialysis status(V45.11) 02/05/2010   restarted 01/02/13 ofter  renal trransplant failure   Secondary hyperparathyroidism (of renal origin)    Shortness of breath    Sleep apnea    wears cpap    Tension headache    Unspecified constipation    Unspecified essential hypertension    Unspecified hereditary and idiopathic peripheral neuropathy    Unspecified vitamin D deficiency     Past Surgical History:  Procedure Laterality Date   AV FISTULA PLACEMENT Left ?2010   "forearm; at [place]"   [place]  03/21/2011   CHOLECYSTECTOMY N/A 10/21/2014   Procedure: LAPAROSCOPIC CHOLECYSTECTOMY WITH INTRAOPERATIVE CHOLANGIOGRAM;  Surgeon: Autumn Messing III, MD;  Location: [place];  Service: General;  Laterality: N/A;   COLONOSCOPY     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   KIDNEY TRANSPLANT  08/17/2011   [place]  10/21/2014   w/IOC   LEFT HEART CATHETERIZATION WITH CORONARY ANGIOGRAM N/A 03/21/2011   Procedure: LEFT HEART CATHETERIZATION WITH CORONARY ANGIOGRAM;  Surgeon: [name], MD;  Location: [facility] CATH LAB;  Service: Cardiovascular;  Laterality: N/A;   NEPHRECTOMY  08/2013   removed transplaned kidney   POLYPECTOMY     POSTERIOR FUSION CERVICAL SPINE  06/25/2012   for spinal stenosis   VASECTOMY  2010    Family History  Adopted: Yes  Problem Relation Age of Onset   Colon cancer Neg Hx    Esophageal cancer Neg Hx    Rectal cancer Neg  Hx    Stomach cancer Neg Hx    Colon polyps Neg Hx               Social History:  reports that he quit smoking about 20 months ago. His smoking use included cigarettes. He started smoking about 30 years ago. He has a 16.00 pack-year smoking history. He has never used smokeless tobacco. He reports that he does not drink alcohol and does not use drugs.  Allergies  Allergen Reactions   Codeine Nausea And Vomiting   Hydrocodone-Acetaminophen Nausea And Vomiting    MEDICATIONS:                                                                                                                      No current facility-administered medications on file prior to encounter.   Current Outpatient Medications on File Prior to Encounter  Medication Sig Dispense Refill   acetaminophen (TYLENOL) 500 MG tablet Take 1 tablet (500 mg total) by mouth every 6 (six) hours as needed for moderate pain.     amLODipine (NORVASC) 10 MG tablet Take 1 tablet (10 mg total) by mouth daily. 90 tablet 3   b complex-vitamin c-folic acid (NEPHRO-VITE) 0.8 MG TABS tablet TAKE 1 TABLET BY MOUTH EVERY DAY (Patient taking differently: Take 1 tablet by mouth daily.) 90 tablet 3   calcitRIOL (ROCALTROL) 0.5 MCG capsule Take 2 capsules (1 mcg total) by mouth every Monday, Wednesday, and Friday with  hemodialysis. 60 capsule 1   Cholecalciferol (VITAMIN D3) 25 MCG (1000 UT) CAPS Take 4,000 Units by mouth daily.     diphenhydrAMINE (BENADRYL) 25 MG tablet Take 50 mg by mouth at bedtime.     doxazosin (CARDURA) 4 MG tablet Take 1 tablet (4 mg total) by mouth daily. 90 tablet 1   guaiFENesin (MUCINEX) 600 MG 12 hr tablet Take 1,200 mg by mouth 2 (two) times daily.     hydrALAZINE (APRESOLINE) 100 MG tablet TAKE 1 TABLET EVERY MORNINGAND AT BEDTIME. (Patient taking differently: Take 100 mg by mouth 2 (two) times daily as needed (for systolic blood pressure greater than 140).) 180 tablet 2   isosorbide mononitrate (IMDUR) 30 MG 24 hr tablet  TAKE 1 TABLET DAILY (Patient taking differently: Take 30 mg by mouth daily.) 90 tablet 1   lidocaine-prilocaine (EMLA) cream Apply 1 application topically See admin instructions. Apply 1 to 2 hours prior to dialysis on Mondays, Wednesdays, and Fridays. Cover with saran wrap.     methocarbamol (ROBAXIN) 750 MG tablet Take 1 tablet (750 mg total) by mouth in the morning, at noon, in the evening, and at bedtime. 180 tablet 1   metoprolol tartrate (LOPRESSOR) 100 MG tablet TAKE 1 TABLET BY MOUTH TWICE A DAY (Patient taking differently: Take 100 mg by mouth 2 (two) times daily.) 180 tablet 1   omeprazole (PRILOSEC) 20 MG capsule TAKE 1 CAPSULE TWICE DAILY BEFORE A MEAL (Patient taking differently: Take 20 mg by mouth 2 (two) times daily before a meal.) 180 capsule 3   ondansetron (ZOFRAN ODT) 4 MG disintegrating tablet Take 1 tablet (4 mg total) by mouth every 8 (eight) hours as needed for nausea or vomiting. 14 tablet 0   pravastatin (PRAVACHOL) 40 MG tablet TAKE 1 TABLET BY MOUTH EVERY DAY IN THE EVENING (Patient taking differently: Take 40 mg by mouth daily.) 90 tablet 2   RENVELA 800 MG tablet Take 1,600-2,400 mg by mouth See admin instructions. Take 2,400 mg by mouth three times a day with meals and 1600 mg with each snack     traMADol (ULTRAM) 50 MG tablet Take 1 tablet (50 mg total) by mouth daily as needed. (Patient taking differently: Take 50 mg by mouth daily as needed for moderate pain.) 30 tablet 0   traZODone (DESYREL) 50 MG tablet TAKE 1/2 TO 1 TABLET BY MOUTH AT BEDTIME AS NEEDED FOR SLEEP (Patient taking differently: Take 50 mg by mouth at bedtime.) 90 tablet 1   vitamin C (ASCORBIC ACID) 250 MG tablet Take 1 tablet (250 mg total) by mouth daily. 90 tablet 3   albuterol (PROVENTIL HFA;VENTOLIN HFA) 108 (90 Base) MCG/ACT inhaler Inhale 2 puffs into the lungs every 6 (six) hours as needed for wheezing or shortness of breath. (Patient not taking: Reported on 11/07/2021) 1 Inhaler 0   cinacalcet  (SENSIPAR) 30 MG tablet Take 30 mg by mouth daily. (Patient not taking: Reported on 11/07/2021)     diazepam (VALIUM) 2 MG tablet Take one pill one hour prior to mri and repeat just prior if  needed 2 tablet 0   folic acid in sodium chloride 0.9 % 50 mL Inject into the vein. And B-Complex.     ipratropium-albuterol (DUONEB) 0.5-2.5 (3) MG/3ML SOLN Take 3 mLs by nebulization every 4 (four) hours as needed (Shortness of breath). (Patient not taking: Reported on 11/07/2021) 360 mL 0   PRESCRIPTION MEDICATION See admin instructions. CPAP- At bedtime and during any time of rest  ROS:                                                                                                                                       Denies recent EtOH use, withdrawal from meds, vision changes, fevers, cough or CP. Has had nausea. 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 Other ROS as per HPI.    Blood pressure (!) 188/84, pulse 65, temperature 98.5 F (36.9 C), temperature source Oral, resp. rate 16, height '5\' 7"'$  (1.702 m), weight 90.7 kg, SpO2 98 %.   General Examination:                                                                                                       Physical Exam  HEENT-  /AT   Lungs- Respirations unlabored Extremities- Dialysis fistula is noted   Neurological Examination Mental Status: Drowsy, oriented x 5, thought content appropriate. Affect dysthymic. Not fully compliant with testing of speech, but naming is intact without dysarthria and he follows all commands.   Cranial Nerves: II: Temporal visual fields intact with no extinction to DSS. PERRL  III,IV, VI: No ptosis. EOMI.  V: Temp sensation equal bilaterally  VII: Smile symmetric VIII: Hearing intact to voice IX,X: No hoarseness XI: Symmetric shoulder shrug XII: Midline tongue extension Motor: BUE 4/5 proximally and distally BLE 4/5 proximally and distally  Sensory: Gross touch intact throughout, bilaterally.  Limited cooperation with sensory exam.  Deep Tendon Reflexes: Poor cooperation.  Cerebellar: No ataxia with FNF bilaterally  Gait: Deferred   Lab Results: Basic Metabolic Panel: Recent Labs  Lab 11/07/21 2144 11/07/21 2150 11/08/21 0415  NA 138 136 138  K 3.5 3.5 3.3*  CL 97* 95* 95*  CO2 26  --  25  GLUCOSE 149* 145* 117*  BUN 40* 36* 41*  CREATININE 11.66* 11.80* 12.02*  CALCIUM 9.3  --  10.2    CBC: Recent Labs  Lab 11/07/21 2144 11/07/21 2150 11/08/21 0415  WBC 4.6  --  4.5  NEUTROABS 3.6  --   --   HGB 10.3* 11.2* 10.1*  HCT 30.0* 33.0* 30.9*  MCV 92.3  --  94.5  PLT 87*  --  90*    Cardiac Enzymes: No results for input(s): "CKTOTAL", "CKMB", "CKMBINDEX", "TROPONINI" in the last 168 hours.  Lipid Panel: No results for input(s): "CHOL", "TRIG", "HDL", "CHOLHDL", "VLDL", "LDLCALC" in the last 168 hours.  Imaging: CT Head Wo Contrast  Result Date: 11/07/2021 CLINICAL DATA:  Headache; seizure-like activity EXAM: CT HEAD WITHOUT CONTRAST TECHNIQUE: Contiguous axial images were obtained from the base of the skull through the vertex without intravenous contrast. RADIATION DOSE REDUCTION: This exam was performed according to the departmental dose-optimization program which includes automated exposure control, adjustment of the mA and/or kV according to patient size and/or use of iterative reconstruction technique. COMPARISON:  None Available. FINDINGS: Brain: Subdural hematoma overlying the right frontal convexity measuring up to 9 mm in greatest radial diameter. Adjacent mass effect on the right frontal lobe with mild loss of sulcation. 1-2 mm of leftward midline shift at the septum pellucidum. Basal cisterns are patent. Normal caliber of the ventricles. No evidence of acute infarct. Mild generalized cerebral atrophy. Mild chronic microvascular ischemic disease. Vascular: No hyperdense vessel or unexpected calcification. Skull: Normal. Negative for fracture or focal  lesion. Sinuses/Orbits: No acute finding. Other: None. IMPRESSION: Acute subdural hematoma overlying the right frontal convexity measuring 9 mm in greatest radial diameter. Mild adjacent mass effect and 1-2 mm of leftward midline shift at the septum pellucidum. These results were called by telephone at the time of interpretation on 11/07/2021 at 10:00 pm to provider [facility] , who verbally acknowledged these results. Electronically Signed   By: Placido Sou M.D.   On: 11/07/2021 22:02     Assessment: 61 year old dialysis patient with new onset seizure.  - Exam reveals diffuse weakness consistent with his history of chronic limb weakness due to spinal stenosis. He is drowsy and poorly cooperative. No seizure-like activity appreciated.  - CT head:  Acute subdural hematoma overlying the right frontal convexity measuring 9 mm in greatest radial diameter. Mild adjacent mass effect and 1-2 mm of leftward midline shift at the septum pellucidum. - DDx for new onset seizure: Most likely secondary to the right frontal subdural hematoma. Possible contributing factors include lowered seizure threshold from Tramadol as well as electrolyte imbalance.   Recommendations:  - Discontinue Tramadol, as it can lower the seizure threshold - EEG - MRI brain without contrast (has ESRD) - Mg  level - Given lesion on CT head, starting an anticonvulsant is indicated for his first-time seizure. Start renally dosed Keppra at 500 mg po qd with 250 mg supplemental dose given after dialysis sessions.   Electronically signed: Dr. Kerney Elbe 11/08/2021, 7:51 AM

## 2021-11-08 NOTE — ED Notes (Signed)
Dialysis notified that the patient is to be moved back to room 46 when appropriate

## 2021-11-08 NOTE — ED Notes (Signed)
Neurologist present at bedside.

## 2021-11-08 NOTE — Significant Event (Addendum)
Rapid Response Event Note   Reason for Call :  Pain behind right eye, 12/10. Blurring vision.   Initial Focused Assessment:  Pt lying in bed, restless. Endorses 12/10 pain behind his right eye. He initially tells me this started 30 minutes ago. He later tells me this pain has been at a 7/10 for three days and is now worse. Pt is 3 hours into his treatment. He has had 2L fluid removed.   Speech is clear. Visual fields are intact. EOMI. Pt moving all extremities equally.   VS: T 98.44F, BP 159/73, HR 65, RR 14, SpO2 100% on room air CBG: 143  Interventions:  -CBG -Reglan given, if pain persists PRN Oxycodone available  Plan of Care:  -Reglan '10mg'$  IV x1 -Oxycodone '5mg'$  PO PRN  Reevaluate patient 1 hour following intervention. Call rapid response for additional needs.  Event Summary:  MD Notified: Dr. Leonel Ramsay, Dr. Starla Link Call Time: [place] Time: 1805 End Time: [place], RN

## 2021-11-08 NOTE — Assessment & Plan Note (Addendum)
Chronic and baseline with platelets of 87. Though does look like this was adult onset and so presumably not hereditary (platelets were normal back in 2008). Defer to neurosurgery if any platelet transfusion needed in setting of SDH.

## 2021-11-08 NOTE — ED Notes (Signed)
Patient in dialysis upon this Rns return to shift

## 2021-11-08 NOTE — ED Notes (Signed)
Patient transported to dialysis at this time.

## 2021-11-08 NOTE — Progress Notes (Signed)
 [facility] BRIEF [facility]  Kirk [name] 01/04/1961 [id number]  Patient is a 61 year old male with ESRD on HD MWF at [facility].  Presented to [facility] due to new onset seizure at home.  CT scan showed acute SDH.  Per notes no recent Falls or head trauma.  Other pertinent findings include K 3.3, Ca 10.2, Scr 12, BUN 41, Hgb 10, hypertension, and O2 sat 98% on RA.  Patient completed full dialysis treatment on 10/16, leaving 1.8L over his dry weight.   OP HD Orders: [facility] MWF 4hr 50mn 450/AF 1.5 2K 2Ca UFP 4 EDW 89.9kg AVF Hep 2500 Calcitriol 1.222m qHD Sensipar '60mg'$  qHD  Plan: Will write orders for HD today per regular schedule.  No Heparin d/t spontaneous SDH.  If patient requires inpatient status will complete full consult.   LiJen MowPA-C [employer]

## 2021-11-08 NOTE — Progress Notes (Signed)
Patient admitted early this morning for seizures and was found to have subdural hematoma.  Patient has been started on Keppra.  Nephrology, neurology has been consulted.  Neurosurgery consulted by ED provider who recommended repeat CT head this morning.  Patient seen and examined at bedside.  He is currently drowsy.  I have reviewed patient's medical records including this morning's H&P, current vitals, labs, medications myself.  Follow neurology and neurosurgery recommendations.  Hemodialysis as per nephrology.  Continue seizure precautions.

## 2021-11-08 NOTE — Assessment & Plan Note (Signed)
Glomerulonephritis, ESRD as adult, hearing loss in childhood.

## 2021-11-08 NOTE — Assessment & Plan Note (Signed)
Call nephrology in AM for routine IP dialysis during stay.

## 2021-11-08 NOTE — H&P (Signed)
History and Physical    Patient: Kirk [name] LOV:[id number] DOB: 10-Jul-1960 DOA: 11/07/2021 DOS: the patient was seen and examined on 11/08/2021 PCP: Ngetich, Nelda Bucks, NP  Patient coming from: Home  Chief Complaint:  Chief Complaint  Patient presents with   Seizures   HPI: Kirk [name] is a 61 y.o. male with medical history significant of ESRD in setting of Al[place] Syndrome, HTN, chronic thrombocytopenia.  Pt had new onset seizure at home lasting ~2 mins while laying down with cpap on.  No recent head trauma per wife.  No prior h/o seizures.  Now back to baseline mental status.  W/u in ED shows acute SDH.  Pt again denies any recent trauma to head.  Last time he fell and hit head was many months ago.  Chronic weakness of BUE and BLE since c spine surgery in Feb 2023.  Has headache, constant over past few days, behind R eye, severe pressure sensation that is worse with coughing.  No CP, no cough, has nausea.  No fevers, no numbness, no vision changes.   Review of Systems: As mentioned in the history of present illness. All other systems reviewed and are negative. 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)    Anxiety    Asthma    Asthma    moderate persistent   Carpal tunnel syndrome    Cellulitis and abscess of trunk    Cholelithiasis 07/13/2014   Chronic headaches    Debility, unspecified    Dermatophytosis of the body    Dysrhythmia    history of   Edema    End stage renal disease on dialysis [facility])    "MWF; [facility] in [facility]" (10/21/2014)   Essential hypertension, benign    GERD (gastroesophageal reflux disease)    Gout, unspecified    HTN (hypertension)    Hypertrophy of prostate without urinary obstruction and other lower urinary tract symptoms (LUTS)    Hypotension, unspecified    Impotence of  organic origin    Insomnia, unspecified    Kidney replaced by transplant    Localization-related (focal) (partial) epilepsy and epileptic syndromes with complex partial seizures, without mention of intractable epilepsy    12-15-19- Wife states he has NEVER had a seizure    Lumbago    Memory loss    OSA on CPAP    Other and unspecified hyperlipidemia    controlled /managed per wife    Other chronic nonalcoholic liver disease    Other malaise and fatigue    Other nonspecific abnormal serum enzyme levels    Pain in joint, lower leg    Pain in joint, upper arm    Pneumonia "several times"   Renal dialysis status(V45.11) 02/05/2010   restarted 01/02/13 ofter renal trransplant failure   Secondary hyperparathyroidism (of renal origin)    Shortness of breath    Sleep apnea    wears cpap    Tension headache    Unspecified constipation    Unspecified essential hypertension    Unspecified hereditary and idiopathic peripheral neuropathy    Unspecified vitamin D deficiency    Past Surgical History:  Procedure Laterality Date   AV FISTULA PLACEMENT Left ?2010   "forearm; at [place]"   [place]  03/21/2011   CHOLECYSTECTOMY  N/A 10/21/2014   Procedure: LAPAROSCOPIC CHOLECYSTECTOMY WITH INTRAOPERATIVE CHOLANGIOGRAM;  Surgeon: Autumn Messing III, MD;  Location: [place];  Service: General;  Laterality: N/A;   COLONOSCOPY     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   KIDNEY TRANSPLANT  08/17/2011   [place]  10/21/2014   w/IOC   LEFT HEART CATHETERIZATION WITH CORONARY ANGIOGRAM N/A 03/21/2011   Procedure: LEFT HEART CATHETERIZATION WITH CORONARY ANGIOGRAM;  Surgeon: [name], MD;  Location: [facility] CATH LAB;  Service: Cardiovascular;  Laterality: N/A;   NEPHRECTOMY  08/2013   removed transplaned kidney   POLYPECTOMY     POSTERIOR FUSION CERVICAL SPINE  06/25/2012   for spinal stenosis   VASECTOMY  2010    Social History:  reports that he quit smoking about 20 months ago. His smoking use included cigarettes. He started smoking about 30 years ago. He has a 16.00 pack-year smoking history. He has never used smokeless tobacco. He reports that he does not drink alcohol and does not use drugs.  Allergies  Allergen Reactions   Codeine Nausea And Vomiting   Hydrocodone-Acetaminophen Nausea And Vomiting    Family History  Adopted: Yes  Problem Relation Age of Onset   Colon cancer Neg Hx    Esophageal cancer Neg Hx    Rectal cancer Neg Hx    Stomach cancer Neg Hx    Colon polyps Neg Hx     Prior to Admission medications   Medication Sig Start Date End Date Taking? Authorizing Provider  acetaminophen (TYLENOL) 500 MG tablet Take 1 tablet (500 mg total) by mouth every 6 (six) hours as needed for moderate pain. 09/12/14  Yes Samuella Cota, MD  amLODipine (NORVASC) 10 MG tablet Take 1 tablet (10 mg total) by mouth daily. 10/19/21  Yes Ngetich, Dinah C, NP  b complex-vitamin c-folic acid (NEPHRO-VITE) 0.8 MG TABS tablet TAKE 1 TABLET BY MOUTH EVERY DAY Patient taking differently: Take 1 tablet by mouth daily. 02/08/20  Yes Reed, Tiffany L, DO  calcitRIOL (ROCALTROL) 0.5 MCG capsule Take 2 capsules (1 mcg total) by mouth every Monday, Wednesday, and Friday with hemodialysis. 06/04/18  Yes Black, Lezlie Octave, NP  Cholecalciferol (VITAMIN D3) 25 MCG (1000 UT) CAPS Take 4,000 Units by mouth daily.   Yes [provider]  diphenhydrAMINE (BENADRYL) 25 MG tablet Take 50 mg by mouth at bedtime.   Yes [provider]  doxazosin (CARDURA) 4 MG tablet Take 1 tablet (4 mg total) by mouth daily. 07/04/21  Yes Ngetich, Dinah C, NP  guaiFENesin (MUCINEX) 600 MG 12 hr tablet Take 1,200 mg by mouth 2 (two) times daily.   Yes [provider]  hydrALAZINE (APRESOLINE) 100 MG tablet TAKE 1 TABLET EVERY MORNINGAND AT BEDTIME. Patient taking differently: Take 100 mg by mouth 2 (two) times daily as  needed (for systolic blood pressure greater than 140). 08/22/21  Yes Hilty, Nadean Corwin, MD  isosorbide mononitrate (IMDUR) 30 MG 24 hr tablet TAKE 1 TABLET DAILY Patient taking differently: Take 30 mg by mouth daily. 11/10/19  Yes Hilty, Nadean Corwin, MD  lidocaine-prilocaine (EMLA) cream Apply 1 application topically See admin instructions. Apply 1 to 2 hours prior to dialysis on Mondays, Wednesdays, and Fridays. Cover with saran wrap. 11/18/18  Yes [provider]  methocarbamol (ROBAXIN) 750 MG tablet Take 1 tablet (750 mg total) by mouth in the morning, at noon, in the evening, and at bedtime. 09/12/21  Yes Ngetich,  Dinah C, NP  metoprolol tartrate (LOPRESSOR) 100 MG tablet TAKE 1 TABLET BY MOUTH TWICE A DAY Patient taking differently: Take 100 mg by mouth 2 (two) times daily. 09/21/20  Yes Hilty, Nadean Corwin, MD  omeprazole (PRILOSEC) 20 MG capsule TAKE 1 CAPSULE TWICE DAILY BEFORE A MEAL Patient taking differently: Take 20 mg by mouth 2 (two) times daily before a meal. 02/09/21  Yes Ngetich, Dinah C, NP  ondansetron (ZOFRAN ODT) 4 MG disintegrating tablet Take 1 tablet (4 mg total) by mouth every 8 (eight) hours as needed for nausea or vomiting. 01/11/21  Yes Bonnielee Haff, MD  pravastatin (PRAVACHOL) 40 MG tablet TAKE 1 TABLET BY MOUTH EVERY DAY IN THE EVENING Patient taking differently: Take 40 mg by mouth daily. 10/19/21  Yes Ngetich, Dinah C, NP  RENVELA 800 MG tablet Take 1,600-2,400 mg by mouth See admin instructions. Take 2,400 mg by mouth three times a day with meals and 1600 mg with each snack 07/06/19  Yes [provider]  traMADol (ULTRAM) 50 MG tablet Take 1 tablet (50 mg total) by mouth daily as needed. Patient taking differently: Take 50 mg by mouth daily as needed for moderate pain. 09/12/21  Yes Ngetich, Dinah C, NP  traZODone (DESYREL) 50 MG tablet TAKE 1/2 TO 1 TABLET BY MOUTH AT BEDTIME AS NEEDED FOR SLEEP Patient taking differently: Take 50 mg by mouth at bedtime.  10/30/21  Yes Ngetich, Dinah C, NP  vitamin C (ASCORBIC ACID) 250 MG tablet Take 1 tablet (250 mg total) by mouth daily. 07/04/21  Yes Ngetich, Dinah C, NP  albuterol (PROVENTIL HFA;VENTOLIN HFA) 108 (90 Base) MCG/ACT inhaler Inhale 2 puffs into the lungs every 6 (six) hours as needed for wheezing or shortness of breath. Patient not taking: Reported on 11/07/2021 06/05/17   Patrecia Pour, MD  cinacalcet (SENSIPAR) 30 MG tablet Take 30 mg by mouth daily. Patient not taking: Reported on 11/07/2021    [provider]  diazepam (VALIUM) 2 MG tablet Take one pill one hour prior to mri and repeat just prior if  needed 08/29/21   Aundra Dubin, PA-C  folic acid in sodium chloride 0.9 % 50 mL Inject into the vein. And B-Complex.    [provider]  ipratropium-albuterol (DUONEB) 0.5-2.5 (3) MG/3ML SOLN Take 3 mLs by nebulization every 4 (four) hours as needed (Shortness of breath). Patient not taking: Reported on 11/07/2021 12/05/18   Mariel Aloe, MD  PRESCRIPTION MEDICATION See admin instructions. CPAP- At bedtime and during any time of rest    [provider]    Physical Exam: Vitals:   11/08/21 0100 11/08/21 0115 11/08/21 0126 11/08/21 0130  BP: (!) 187/92 (!) 187/70  (!) 162/64  Pulse: 86 80  75  Resp: (!) 25   19  Temp:   98.6 F (37 C)   TempSrc:   Oral   SpO2: 98% 100%  96%  Weight:      Height:       Constitutional: NAD, calm, comfortable Eyes: PERRL, lids and conjunctivae normal ENMT: Mucous membranes are moist. Posterior pharynx clear of any exudate or lesions.Normal dentition.  Neck: normal, supple, no masses, no thyromegaly Respiratory: clear to auscultation bilaterally, no wheezing, no crackles. Normal respiratory effort. No accessory muscle use.  Cardiovascular: Regular rate and rhythm, no murmurs / rubs / gallops. No extremity edema. 2+ pedal pulses. No carotid bruits.  Abdomen: no tenderness, no masses palpated. No hepatosplenomegaly. Bowel sounds  positive.  Musculoskeletal: no  clubbing / cyanosis. No joint deformity upper and lower extremities. Good ROM, no contractures. Normal muscle tone.  Skin: no rashes, lesions, ulcers. No induration Neurologic: CN 2-12 grossly intact. Sensation intact, DTR normal. Strength 5/5 in all 4.  Psychiatric: Normal judgment and insight. Alert and oriented x 3. Normal mood.   Data Reviewed:      Assessment and Plan: * SDH (subdural hematoma) (HCC) Apparent spontaneous SDH, no recent trauma to head. NS called by EDP: Recd repeat CT head in AM Neuro checks Pt AAOx3, GCS 15 currently Tele monitor Hold home ASA  Seizure ([place]) New onset seizure in setting of acute SDH. Got 1g keppra load in ED Neurology consulted by EDP, further AEDs per neurology Seizure precautions  Thrombocytopenia ([place]) Chronic and baseline with platelets of 87. Though does look like this was adult onset and so presumably not hereditary (platelets were normal back in 2008). Defer to neurosurgery if any platelet transfusion needed in setting of SDH.  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  Essential hypertension Cont home BP meds PRN hydralazine ordered  ESRD on dialysis [facility]) Call nephrology in AM for routine IP dialysis during stay.  OSA on CPAP Ill hold CPAP for tonight, at least until we dont think hes going to seize again.      Advance Care Planning:   Code Status: Full Code  Consults: Neuro, neurosurgery  Family Communication: Wife at bedside  Severity of Illness: The appropriate patient status for this patient is OBSERVATION. Observation status is judged to be reasonable and necessary in order to provide the required intensity of service to ensure the patient's safety. The patient's presenting symptoms, physical exam findings, and initial radiographic and laboratory data in the context of their medical condition is felt to place them at decreased risk for further  clinical deterioration. Furthermore, it is anticipated that the patient will be medically stable for discharge from the hospital within 2 midnights of admission.   Author: Etta Quill., DO 11/08/2021 2:24 AM  For on call review [email or web address].

## 2021-11-09 DIAGNOSIS — Q8781 Alport syndrome: Secondary | ICD-10-CM | POA: Diagnosis not present

## 2021-11-09 DIAGNOSIS — N186 End stage renal disease: Secondary | ICD-10-CM | POA: Diagnosis not present

## 2021-11-09 DIAGNOSIS — R569 Unspecified convulsions: Secondary | ICD-10-CM | POA: Diagnosis not present

## 2021-11-09 DIAGNOSIS — S065XAA Traumatic subdural hemorrhage with loss of consciousness status unknown, initial encounter: Secondary | ICD-10-CM | POA: Diagnosis not present

## 2021-11-09 LAB — POTASSIUM: Potassium: 4.2 mmol/L (ref 3.5–5.1)

## 2021-11-09 LAB — CBG MONITORING, ED: Glucose-Capillary: 143 mg/dL — ABNORMAL HIGH (ref 70–99)

## 2021-11-09 LAB — HEPATITIS C ANTIBODY: HCV Ab: NONREACTIVE — AB

## 2021-11-09 MED ORDER — PROCHLORPERAZINE EDISYLATE 10 MG/2ML IJ SOLN
10.0000 mg | Freq: Once | INTRAMUSCULAR | Status: AC
  Administered 2021-11-09: 10 mg via INTRAVENOUS
  Filled 2021-11-09: qty 2

## 2021-11-09 MED ORDER — ALUM & MAG HYDROXIDE-SIMETH 200-200-20 MG/5ML PO SUSP
15.0000 mL | ORAL | Status: DC | PRN
  Administered 2021-11-10 – 2021-11-12 (×3): 15 mL via ORAL
  Filled 2021-11-09 (×3): qty 30

## 2021-11-09 MED ORDER — METOCLOPRAMIDE HCL 5 MG/ML IJ SOLN
10.0000 mg | Freq: Four times a day (QID) | INTRAMUSCULAR | Status: DC | PRN
  Administered 2021-11-09 – 2021-11-18 (×10): 10 mg via INTRAVENOUS
  Filled 2021-11-09 (×10): qty 2

## 2021-11-09 MED ORDER — HYDROMORPHONE HCL 1 MG/ML IJ SOLN
0.5000 mg | INTRAMUSCULAR | Status: DC | PRN
  Administered 2021-11-09 – 2021-11-16 (×6): 0.5 mg via INTRAVENOUS
  Filled 2021-11-09 (×6): qty 0.5

## 2021-11-09 MED ORDER — DIPHENHYDRAMINE HCL 50 MG/ML IJ SOLN
12.5000 mg | Freq: Once | INTRAMUSCULAR | Status: AC
  Administered 2021-11-09: 12.5 mg via INTRAVENOUS
  Filled 2021-11-09: qty 1

## 2021-11-09 MED ORDER — CHLORHEXIDINE GLUCONATE CLOTH 2 % EX PADS
6.0000 | MEDICATED_PAD | Freq: Every day | CUTANEOUS | Status: DC
  Administered 2021-11-10 – 2021-11-12 (×3): 6 via TOPICAL

## 2021-11-09 NOTE — Progress Notes (Signed)
Spoke with nurse pt is currently being transferred to a different room and unavailable to do EEG at the moment.

## 2021-11-09 NOTE — Progress Notes (Signed)
EEG complete - results pending 

## 2021-11-09 NOTE — Progress Notes (Addendum)
Neurology Progress Note  Brief HPI:  60 y.o. 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  S:// Patient is laying in bed, restless c/o nausea and headache behind right eye which is constant.  Events overnight noted. RRT called for pain behind right eye and blurry vision.  Neurosurgery consulted -no surgical intervention at this time    O:// Current vital signs: BP (!) 174/67   Pulse 71   Temp 98.4 F (36.9 C) (Oral)   Resp (!) 24   Ht '5\' 7"'$  (1.702 m)   Wt 90.7 kg   SpO2 99%   BMI 31.32 kg/m  Vital signs in last 24 hours: Temp:  [97.9 F (36.6 C)-98.4 F (36.9 C)] 98.4 F (36.9 C) (10/19 0400) Pulse Rate:  [53-83] 71 (10/19 0700) Resp:  [13-27] 24 (10/19 0700) BP: (101-180)/(52-106) 174/67 (10/19 0400) SpO2:  [96 %-100 %] 99 % (10/19 0700)  GENERAL: restless. Awake, alert in NAD HEENT: - Normocephalic and atraumatic, dry mm LUNGS - Clear to auscultation bilaterally with no wheezes CV - S1S2 RRR, no m/r/g, equal pulses bilaterally. ABDOMEN - Soft, nontender, nondistended with normoactive BS Ext: warm, well perfused, intact peripheral pulses, no edema. Left AVF   NEURO:  Mental Status: AA&Ox3  Language: speech is clear.  Naming, repetition, fluency, and comprehension intact. Cranial Nerves: PERRL, EOMI, visual fields full, no facial asymmetry, facial sensation intact, hearing intact, tongue/uvula/soft palate midline, normal sternocleidomastoid and trapezius muscle strength. No evidence of tongue atrophy or fibrillations Motor: 4/5 in all 4 extremities  Tone: is normal and bulk is normal Sensation- Intact to light touch  bilaterally Coordination: FTN intact bilaterally, no ataxia in BLE. Gait- deferred   Medications  Current Facility-Administered Medications:    acetaminophen (TYLENOL) tablet 650 mg, 650 mg, Oral, Q6H PRN, 650 mg at 11/08/21 1403 **OR** acetaminophen (TYLENOL) suppository 650 mg, 650 mg, Rectal, Q6H PRN, Alcario Drought, Jared M, DO   amLODipine (NORVASC) tablet 10 mg, 10 mg, Oral, Daily, Alcario Drought, Jared M, DO, 10 mg at 11/08/21 1029   calcitRIOL (ROCALTROL) capsule 1 mcg, 1 mcg, Oral, Q M,W,F-HD, Alcario Drought, Jared M, DO, 1 mcg at 11/08/21 2026   Chlorhexidine Gluconate Cloth 2 % PADS 6 each, 6 each, Topical, Q0600, Penninger, Ria Comment, PA   cholecalciferol (VITAMIN D3) 25 MCG (1000 UNIT) tablet 4,000 Units, 4,000 Units, Oral, Daily, Alcario Drought, Jared M, DO, 4,000 Units at 11/08/21 1028   doxazosin (CARDURA) tablet 4 mg, 4 mg, Oral, Daily, Alcario Drought, Jared M, DO, 4 mg at 11/08/21 1029   guaiFENesin (MUCINEX) 12 hr tablet 1,200 mg, 1,200 mg, Oral, BID, Alcario Drought, Jared M, DO, 1,200 mg at 11/08/21 1026   hydrALAZINE (APRESOLINE) injection 10-20 mg, 10-20 mg, Intravenous, Q4H PRN, Alcario Drought, Jared M, DO, 10 mg at 11/08/21 0740   isosorbide mononitrate (IMDUR) 24 hr tablet 30 mg, 30 mg, Oral, Daily, Alcario Drought, Jared M, DO, 30 mg at 11/08/21 1027   labetalol (NORMODYNE) injection 10 mg, 10 mg, Intravenous, Once, Gareth Morgan, MD   levETIRAcetam (KEPPRA) 250 mg in sodium chloride 0.9 % 100 mL IVPB, 250 mg, Intravenous, Q M,W,F-1800, Kerney Elbe, MD, Stopped at 11/08/21 2144   levETIRAcetam (KEPPRA) IVPB 500 mg/100  mL premix, 500 mg, Intravenous, Daily, Kerney Elbe, MD, Stopped at 11/08/21 1105   lidocaine (PF) (XYLOCAINE) 1 % injection 5 mL, 5 mL, Intradermal, PRN, Penninger, Ria Comment, PA   lidocaine-prilocaine (EMLA) cream 1 Application, 1 Application, Topical, PRN, Penninger, Ria Comment, PA   methocarbamol (ROBAXIN) tablet 750 mg, 750 mg, Oral, QID, Alcario Drought, Jared M, DO, 750 mg at 11/08/21 2026   metoprolol tartrate  (LOPRESSOR) tablet 100 mg, 100 mg, Oral, BID, Alcario Drought, Jared M, DO, 100 mg at 11/08/21 2028   multivitamin (RENA-VIT) tablet 1 tablet, 1 tablet, Oral, Daily, Alcario Drought, Jared M, DO, 1 tablet at 11/08/21 1028   ondansetron (ZOFRAN) tablet 4 mg, 4 mg, Oral, Q6H PRN **OR** ondansetron (ZOFRAN) injection 4 mg, 4 mg, Intravenous, Q6H PRN, Etta Quill, DO, 4 mg at 11/09/21 5009   oxyCODONE (Oxy IR/ROXICODONE) immediate release tablet 5 mg, 5 mg, Oral, Q4H PRN, Starla Link, Kshitiz, MD, 5 mg at 11/09/21 0100   pantoprazole (PROTONIX) EC tablet 40 mg, 40 mg, Oral, BID AC, Gardner, Jared M, DO, 40 mg at 11/08/21 2025   pentafluoroprop-tetrafluoroeth (GEBAUERS) aerosol 1 Application, 1 Application, Topical, PRN, Penninger, Ria Comment, PA   pravastatin (PRAVACHOL) tablet 40 mg, 40 mg, Oral, Daily, Alcario Drought, Jared M, DO, 40 mg at 11/08/21 1028   sevelamer carbonate (RENVELA) tablet 1,600 mg, 1,600 mg, Oral, PRN, Alcario Drought, Jared M, DO   sevelamer carbonate (RENVELA) tablet 3,200 mg, 3,200 mg, Oral, TID WC, Alcario Drought, Jared M, DO, 3,200 mg at 11/08/21 2024   traZODone (DESYREL) tablet 50 mg, 50 mg, Oral, QHS PRN, Starla Link, Kshitiz, MD  Current Outpatient Medications:    acetaminophen (TYLENOL) 500 MG tablet, Take 1 tablet (500 mg total) by mouth every 6 (six) hours as needed for moderate pain., Disp: , Rfl:    amLODipine (NORVASC) 10 MG tablet, Take 1 tablet (10 mg total) by mouth daily., Disp: 90 tablet, Rfl: 3   b complex-vitamin c-folic acid (NEPHRO-VITE) 0.8 MG TABS tablet, TAKE 1 TABLET BY MOUTH EVERY DAY (Patient taking differently: Take 1 tablet by mouth daily.), Disp: 90 tablet, Rfl: 3   calcitRIOL (ROCALTROL) 0.5 MCG capsule, Take 2 capsules (1 mcg total) by mouth every Monday, Wednesday, and Friday with hemodialysis., Disp: 60 capsule, Rfl: 1   Cholecalciferol (VITAMIN D3) 25 MCG (1000 UT) CAPS, Take 4,000 Units by mouth daily., Disp: , Rfl:    diphenhydrAMINE (BENADRYL) 25 MG tablet, Take 50 mg by mouth at  bedtime., Disp: , Rfl:    doxazosin (CARDURA) 4 MG tablet, Take 1 tablet (4 mg total) by mouth daily., Disp: 90 tablet, Rfl: 1   guaiFENesin (MUCINEX) 600 MG 12 hr tablet, Take 1,200 mg by mouth 2 (two) times daily., Disp: , Rfl:    hydrALAZINE (APRESOLINE) 100 MG tablet, TAKE 1 TABLET EVERY MORNINGAND AT BEDTIME. (Patient taking differently: Take 100 mg by mouth 2 (two) times daily as needed (for systolic blood pressure greater than 140).), Disp: 180 tablet, Rfl: 2   isosorbide mononitrate (IMDUR) 30 MG 24 hr tablet, TAKE 1 TABLET DAILY (Patient taking differently: Take 30 mg by mouth daily.), Disp: 90 tablet, Rfl: 1   lidocaine-prilocaine (EMLA) cream, Apply 1 application topically See admin instructions. Apply 1 to 2 hours prior to dialysis on Mondays, Wednesdays, and Fridays. Cover with saran wrap., Disp: , Rfl:    methocarbamol (ROBAXIN) 750 MG tablet, Take 1 tablet (750 mg total) by mouth in the morning, at noon, in the evening, and at bedtime., Disp: 180 tablet, Rfl: 1   metoprolol  tartrate (LOPRESSOR) 100 MG tablet, TAKE 1 TABLET BY MOUTH TWICE A DAY (Patient taking differently: Take 100 mg by mouth 2 (two) times daily.), Disp: 180 tablet, Rfl: 1   omeprazole (PRILOSEC) 20 MG capsule, TAKE 1 CAPSULE TWICE DAILY BEFORE A MEAL (Patient taking differently: Take 20 mg by mouth 2 (two) times daily before a meal.), Disp: 180 capsule, Rfl: 3   ondansetron (ZOFRAN ODT) 4 MG disintegrating tablet, Take 1 tablet (4 mg total) by mouth every 8 (eight) hours as needed for nausea or vomiting., Disp: 14 tablet, Rfl: 0   pravastatin (PRAVACHOL) 40 MG tablet, TAKE 1 TABLET BY MOUTH EVERY DAY IN THE EVENING (Patient taking differently: Take 40 mg by mouth daily.), Disp: 90 tablet, Rfl: 2   RENVELA 800 MG tablet, Take 1,600-2,400 mg by mouth See admin instructions. Take 2,400 mg by mouth three times a day with meals and 1600 mg with each snack, Disp: , Rfl:    traMADol (ULTRAM) 50 MG tablet, Take 1 tablet (50 mg  total) by mouth daily as needed. (Patient taking differently: Take 50 mg by mouth daily as needed for moderate pain.), Disp: 30 tablet, Rfl: 0   traZODone (DESYREL) 50 MG tablet, TAKE 1/2 TO 1 TABLET BY MOUTH AT BEDTIME AS NEEDED FOR SLEEP (Patient taking differently: Take 50 mg by mouth at bedtime.), Disp: 90 tablet, Rfl: 1   vitamin C (ASCORBIC ACID) 250 MG tablet, Take 1 tablet (250 mg total) by mouth daily., Disp: 90 tablet, Rfl: 3   albuterol (PROVENTIL HFA;VENTOLIN HFA) 108 (90 Base) MCG/ACT inhaler, Inhale 2 puffs into the lungs every 6 (six) hours as needed for wheezing or shortness of breath. (Patient not taking: Reported on 11/07/2021), Disp: 1 Inhaler, Rfl: 0   cinacalcet (SENSIPAR) 30 MG tablet, Take 30 mg by mouth daily. (Patient not taking: Reported on 11/07/2021), Disp: , Rfl:    diazepam (VALIUM) 2 MG tablet, Take one pill one hour prior to mri and repeat just prior if  needed, Disp: 2 tablet, Rfl: 0   folic acid in sodium chloride 0.9 % 50 mL, Inject into the vein. And B-Complex., Disp: , Rfl:    ipratropium-albuterol (DUONEB) 0.5-2.5 (3) MG/3ML SOLN, Take 3 mLs by nebulization every 4 (four) hours as needed (Shortness of breath). (Patient not taking: Reported on 11/07/2021), Disp: 360 mL, Rfl: 0   PRESCRIPTION MEDICATION, See admin instructions. CPAP- At bedtime and during any time of rest, Disp: , Rfl:  Labs CBC    Component Value Date/Time   WBC 4.5 11/08/2021 0415   RBC 3.27 (L) 11/08/2021 0415   HGB 10.1 (L) 11/08/2021 0415   HGB 10.0 (L) 08/24/2021 1005   HCT 30.9 (L) 11/08/2021 0415   PLT 90 (L) 11/08/2021 0415   PLT 86 (L) 08/24/2021 1005   MCV 94.5 11/08/2021 0415   MCH 30.9 11/08/2021 0415   MCHC 32.7 11/08/2021 0415   RDW 13.3 11/08/2021 0415   RDW 16.7 (H) 11/26/2013 0914   LYMPHSABS 0.4 (L) 11/07/2021 2144   LYMPHSABS 1.3 11/26/2013 0914   MONOABS 0.4 11/07/2021 2144   EOSABS 0.0 11/07/2021 2144   EOSABS 0.2 11/26/2013 0914   BASOSABS 0.0 11/07/2021 2144    BASOSABS 0.1 11/26/2013 0914    CMP     Component Value Date/Time   NA 138 11/08/2021 0415   NA 136 11/26/2013 0914   K 3.3 (L) 11/08/2021 0415   CL 95 (L) 11/08/2021 0415   CO2 25 11/08/2021 0415   GLUCOSE  117 (H) 11/08/2021 0415   BUN 41 (H) 11/08/2021 0415   BUN 66 (A) 05/25/2015 0000   BUN 66 (A) 05/25/2015 0000   CREATININE 12.02 (H) 11/08/2021 0415   CREATININE 11.65 (HH) 05/16/2021 1611   CREATININE 8.37 (H) 02/03/2018 1523   CALCIUM 10.2 11/08/2021 0415   CALCIUM 8.8 06/08/2015 0000   CALCIUM 7.9 (L) 01/19/2010 1016   PROT 6.5 11/07/2021 2144   PROT 6.7 08/27/2013 1351   ALBUMIN 3.2 (L) 11/07/2021 2144   ALBUMIN 3.6 08/27/2013 1351   AST 11 (L) 11/07/2021 2144   AST 11 (L) 05/16/2021 1611   ALT 11 11/07/2021 2144   ALT 9 05/16/2021 1611   ALKPHOS 55 11/07/2021 2144   BILITOT 0.7 11/07/2021 2144   BILITOT 0.4 05/16/2021 1611   GFRNONAA 4 (L) 11/08/2021 0415   GFRNONAA 5 (L) 05/16/2021 1611   GFRAA 4 (L) 07/15/2019 0704    glycosylated hemoglobin  Lipid Panel     Component Value Date/Time   CHOL 91 05/25/2021 0814   CHOL 195 06/29/2015 1533   TRIG 95 05/25/2021 0814   HDL 31 (L) 05/25/2021 0814   HDL 17 (L) 06/29/2015 1533   CHOLHDL 2.9 05/25/2021 0814   VLDL 32 01/28/2018 0242   LDLCALC 42 05/25/2021 0814     Imaging I have reviewed images in epic and the results pertinent to this consultation are:  CT-scan of the brain 10/17 Acute subdural hematoma overlying the right frontal convexity measuring 9 mm in greatest radial diameter. Mild adjacent mass effect and 1-2 mm of leftward midline shift at the septum pellucidum  Stability scan Ct brain 10/18 Unchanged subdural hematoma along the right cerebral convexity measuring up to 9 mm at the anterior frontal lobe.  Assessment:  61 year old dialysis patient with acute right SDH with new onset seizure  New onset seizure due to right SDH   Recommendations: - seizure precautions  - continue Keppra  '500mg'$  daily and supplemental dose of '250mg'$  after HD sessions  -Neurology will continue to follow   [place] Per [facility] statutes, patients with seizures are not allowed to drive until they have been seizure-free for six months.   Use caution when using heavy equipment or power tools. Avoid working on ladders or at heights. Take showers instead of baths. Ensure the water temperature is not too high on the home water heater. Do not go swimming alone. Do not lock yourself in a room alone (i.e. bathroom). When caring for infants or small children, sit down when holding, feeding, or changing them to minimize risk of injury to the child in the event you have a seizure. Maintain good sleep hygiene. Avoid alcohol.    If patient has another seizure, call 911 and bring them back to the ED if: A.  The seizure lasts longer than 5 minutes.      B.  The patient doesn't wake shortly after the seizure or has new problems such as difficulty seeing, speaking or moving following the seizure C.  The patient was injured during the seizure D.  The patient has a temperature over 102 F (39C) E.  The patient vomited during the seizure and now is having trouble breathing   Beulah Gandy DNP, ACNPC-AG

## 2021-11-09 NOTE — ED Notes (Signed)
RN informed of bed assignment readiness.

## 2021-11-09 NOTE — Progress Notes (Signed)
Pt receives out-pt HD at [facility] on MWF. Pt arrives at 6:20 am for 6:40 am chair time. Will assist as needed.   Melven Sartorius Renal Navigator [phone]

## 2021-11-09 NOTE — Progress Notes (Signed)
Patient arrived to room 3W07 in NAD, VS stable. Patient oriented to room and call bell in reach.

## 2021-11-09 NOTE — Progress Notes (Signed)
PROGRESS NOTE    Kirk [name]  LFY:[id number] DOB: [birth year] DOA: 11/07/2021 PCP: Sandrea Hughs, NP   Brief Narrative:  61 year old male with history of end-stage renal disease on hemodialysis, Alport syndrome, hypertension, chronic thrombocytopenia, anemia of chronic disease, chronic headaches, hyperlipidemia, OSA on CPAP, pancytopenia, diabetes mellitus type 2, memory impairment, chronic diastolic CHF, chronic bilateral upper extremity and lower extremity weakness since C-spine surgery in February 2023 presented with new onset seizure at home lasting for 2 minutes.  On presentation, CT of the head showed subdural hematoma.  Neurology and neurosurgery were consulted.  He was started on Keppra.  Assessment & Plan:  New onset seizures -Possibly due to right subdural hematoma as per neurology.  Neurology following.  Has been started on Keppra, renally dosed. -Seizure/fall precautions. -Follow neurology recommendations  Subdural hematoma -Repeat CT of the head still showed some subdural hematoma.  Neurosurgery recommended conservative management.  End-stage renal disease on hemodialysis -Nephrology following.  Dialysis as per nephrology schedule.  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  Continue amlodipine, Imdur, metoprolol  OSA on CPAP -Continue CPAP at night  Anemia of chronic disease -From renal failure.  Hemoglobin stable.  Monitor intermittently  Chronic thrombocytopenia -Platelets stable.  Monitor intermittently  Hypokalemia -Repeat a.m. labs.  GERD -Continue Protonix  Hyperlipidemia -Continue statin  Diabetes mellitus type 2--start CBGs with SSI  Obesity -Outpatient follow-up  Chronic diastolic CHF -Volume managed by dialysis.  Strict input and output.  Daily weights.  Fluid restriction.  Physical deconditioning -PT eval  DVT prophylaxis: SCDs Code Status:  Full Family Communication: None at bedside Disposition Plan: Status is: Inpatient Remains inpatient appropriate because: Of severity of illness  Consultants: Neurology/nephrology/neurosurgery  Procedures: None  Antimicrobials: None   Subjective: Patient seen and examined at bedside.  Poor historian.  Complains of headache and intermittent abdominal pain and nausea.  No fever, agitation reported.  Objective: Vitals:   11/09/21 0700 11/09/21 0915 11/09/21 0949 11/09/21 1000  BP:   (!) 189/82 (!) 186/79  Pulse: 71 69 83 72  Resp: (!) 24   (!) 22  Temp:    98.4 F (36.9 C)  TempSrc:    Oral  SpO2: 99% 100%  100%  Weight:      Height:        Intake/Output Summary (Last 24 hours) at 11/09/2021 1038 Last data filed at 11/08/2021 2010 Gross per 24 hour  Intake 340 ml  Output 2200 ml  Net -1860 ml   Filed Weights   11/07/21 2053  Weight: 90.7 kg    Examination:  General exam: Appears calm and comfortable.  Currently on room air.  Looks chronically ill and deconditioned. Respiratory system: Bilateral decreased breath sounds at bases with some scattered crackles Cardiovascular system: S1 & S2 heard, Rate controlled Gastrointestinal system: Abdomen is obese, nondistended, soft and nontender. Normal bowel sounds heard. Extremities: No cyanosis, clubbing; trace lower extremity edema present  Central nervous system: Awake.  Slow to respond.  Poor historian.  No focal neurological deficits. Moving extremities.  No obvious seizures seen Skin: No rashes, lesions or ulcers Psychiatry: Flat affect.  No signs of agitation   Data Reviewed: I have personally reviewed following labs and imaging studies  CBC: Recent Labs  Lab 11/07/21 2144 11/07/21 2150 11/08/21 0415  WBC 4.6  --  4.5  NEUTROABS 3.6  --   --   HGB 10.3* 11.2* 10.1*  HCT 30.0* 33.0* 30.9*  MCV 92.3  --  94.5  PLT 87*  --  90*   Basic Metabolic Panel: Recent Labs  Lab 11/07/21 2144 11/07/21 2150  11/08/21 0415  NA 138 136 138  K 3.5 3.5 3.3*  CL 97* 95* 95*  CO2 26  --  25  GLUCOSE 149* 145* 117*  BUN 40* 36* 41*  CREATININE 11.66* 11.80* 12.02*  CALCIUM 9.3  --  10.2   GFR: Estimated Creatinine Clearance: 7.1 mL/min (A) (by C-G formula based on SCr of 12.02 mg/dL (H)). Liver Function Tests: Recent Labs  Lab 11/07/21 2144  AST 11*  ALT 11  ALKPHOS 55  BILITOT 0.7  PROT 6.5  ALBUMIN 3.2*   No results for input(s): "LIPASE", "AMYLASE" in the last 168 hours. No results for input(s): "AMMONIA" in the last 168 hours. Coagulation Profile: No results for input(s): "INR", "PROTIME" in the last 168 hours. Cardiac Enzymes: No results for input(s): "CKTOTAL", "CKMB", "CKMBINDEX", "TROPONINI" in the last 168 hours. BNP (last 3 results) No results for input(s): "PROBNP" in the last 8760 hours. HbA1C: No results for input(s): "HGBA1C" in the last 72 hours. CBG: Recent Labs  Lab 11/08/21 1832  GLUCAP 143*   Lipid Profile: No results for input(s): "CHOL", "HDL", "LDLCALC", "TRIG", "CHOLHDL", "LDLDIRECT" in the last 72 hours. Thyroid Function Tests: No results for input(s): "TSH", "T4TOTAL", "FREET4", "T3FREE", "THYROIDAB" in the last 72 hours. Anemia Panel: No results for input(s): "VITAMINB12", "FOLATE", "FERRITIN", "TIBC", "IRON", "RETICCTPCT" in the last 72 hours. Sepsis Labs: No results for input(s): "PROCALCITON", "LATICACIDVEN" in the last 168 hours.  No results found for this or any previous visit (from the past 240 hour(s)).       Radiology Studies: CT HEAD WO CONTRAST (5MM)  Result Date: 11/08/2021 CLINICAL DATA:  Follow-up subdural hematoma EXAM: CT HEAD WITHOUT CONTRAST TECHNIQUE: Contiguous axial images were obtained from the base of the skull through the vertex without intravenous contrast. RADIATION DOSE REDUCTION: This exam was performed according to the departmental dose-optimization program which includes automated exposure control, adjustment of  the mA and/or kV according to patient size and/or use of iterative reconstruction technique. COMPARISON:  Yesterday FINDINGS: Brain: Mixed density subdural hematoma along the right cerebral convexity, primarily lateral but also minimally seen inferiorly. In the anterior frontal region maximal thickness is 9 mm. No progression when compared on coronal reformats. Mild right cerebral hemispheric mass effect. No infarct, hydrocephalus, or masslike finding. Vascular: No hyperdense vessel or unexpected calcification. Skull: Normal. Negative for fracture or focal lesion. Sinuses/Orbits: No acute finding. IMPRESSION: Unchanged subdural hematoma along the right cerebral convexity measuring up to 9 mm at the anterior frontal lobe. Electronically Signed   By: Jorje Guild M.D.   On: 11/08/2021 10:12   CT Head Wo Contrast  Result Date: 11/07/2021 CLINICAL DATA:  Headache; seizure-like activity EXAM: CT HEAD WITHOUT CONTRAST TECHNIQUE: Contiguous axial images were obtained from the base of the skull through the vertex without intravenous contrast. RADIATION DOSE REDUCTION: This exam was performed according to the departmental dose-optimization program which includes automated exposure control, adjustment of the mA and/or kV according to patient size and/or use of iterative reconstruction technique. COMPARISON:  None Available. FINDINGS: Brain: Subdural hematoma overlying the right frontal convexity measuring up to 9 mm in greatest radial diameter. Adjacent mass effect on the right frontal lobe with mild loss of sulcation. 1-2 mm of leftward midline shift at the septum pellucidum. Basal cisterns are patent. Normal caliber of the ventricles. No evidence of acute  infarct. Mild generalized cerebral atrophy. Mild chronic microvascular ischemic disease. Vascular: No hyperdense vessel or unexpected calcification. Skull: Normal. Negative for fracture or focal lesion. Sinuses/Orbits: No acute finding. Other: None. IMPRESSION:  Acute subdural hematoma overlying the right frontal convexity measuring 9 mm in greatest radial diameter. Mild adjacent mass effect and 1-2 mm of leftward midline shift at the septum pellucidum. These results were called by telephone at the time of interpretation on 11/07/2021 at 10:00 pm to provider [facility] , who verbally acknowledged these results. Electronically Signed   By: Placido Sou M.D.   On: 11/07/2021 22:02        Scheduled Meds:  amLODipine  10 mg Oral Daily   calcitRIOL  1 mcg Oral Q M,W,F-HD   Chlorhexidine Gluconate Cloth  6 each Topical Q0600   cholecalciferol  4,000 Units Oral Daily   doxazosin  4 mg Oral Daily   guaiFENesin  1,200 mg Oral BID   isosorbide mononitrate  30 mg Oral Daily   labetalol  10 mg Intravenous Once   methocarbamol  750 mg Oral QID   metoprolol tartrate  100 mg Oral BID   multivitamin  1 tablet Oral Daily   pantoprazole  40 mg Oral BID AC   pravastatin  40 mg Oral Daily   sevelamer carbonate  3,200 mg Oral TID WC   Continuous Infusions:  levETIRAcetam Stopped (11/08/21 2144)   levETIRAcetam 500 mg (11/09/21 1028)          Aline August, MD [facility] 11/09/2021, 10:38 AM

## 2021-11-09 NOTE — Consult Note (Signed)
[name] Renal Consultation Note    Indication for Consultation:  Management of ESRD/hemodialysis; anemia, hypertension/volume and secondary hyperparathyroidism  HKV:[id number], Kirk C, NP  HPI: Kirk [name] is a 61 y.o. male. ESRD on HD MWF at [facility] Past medical history significant for Alport syndrome, hypertension, OSA on CPAP, pancytopenia, diabetes mellitus type 2, memory impairment, chronic diastolic CHF, chronic bilateral upper extremity and lower extremity weakness since [name]-spine surgery Feb 2023. Presented to ED for new onset seizure at home lasting for 2 minutes. CT head showed subdural hematoma. Neurosurgery recommends conservative management.   Patient was seen in emergency department this morning. He has complaints of persistent headache and generally does not feel well. Unable to identify anything that makes headache worse but does think dialysis yesterday made headache better. He reports that he had to stop dialysis early yesterday due to feeling very nauseated but he not vomit. Is still feeling nauseous this morning. Denies dizziness, SOB, CP, abdominal pain, LE edema. Is concerned about finding out why had a seizure.   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)    Anxiety    Asthma    Asthma    moderate persistent   Carpal tunnel syndrome    Cellulitis and abscess of trunk    Cholelithiasis 07/13/2014   Chronic headaches    Debility, unspecified    Dermatophytosis of the body    Dysrhythmia    history of   Edema    End stage renal disease on dialysis [facility])    "MWF; [facility]" (10/21/2014)   Essential hypertension, benign    GERD (gastroesophageal reflux disease)    Gout, unspecified    HTN (hypertension)    Hypertrophy of prostate without urinary obstruction and other lower urinary  tract symptoms (LUTS)    Hypotension, unspecified    Impotence of organic origin    Insomnia, unspecified    Kidney replaced by transplant    Localization-related (focal) (partial) epilepsy and epileptic syndromes with complex partial seizures, without mention of intractable epilepsy    12-15-19- Wife states he has NEVER had a seizure    Lumbago    Memory loss    OSA on CPAP    Other and unspecified hyperlipidemia    controlled /managed per wife    Other chronic nonalcoholic liver disease    Other malaise and fatigue    Other nonspecific abnormal serum enzyme levels    Pain in joint, lower leg    Pain in joint, upper arm    Pneumonia "several times"   Renal dialysis status(V45.11) 02/05/2010   restarted 01/02/13 ofter renal trransplant failure   Secondary hyperparathyroidism (of renal origin)    Shortness of breath    Sleep apnea    wears cpap    Tension headache    Unspecified constipation    Unspecified essential hypertension    Unspecified hereditary and idiopathic peripheral neuropathy    Unspecified vitamin D deficiency    Past Surgical History:  Procedure Laterality Date   AV FISTULA PLACEMENT Left ?2010   "forearm; at [place]"   [place]  03/21/2011   CHOLECYSTECTOMY N/A 10/21/2014   Procedure: LAPAROSCOPIC CHOLECYSTECTOMY WITH INTRAOPERATIVE CHOLANGIOGRAM;  Surgeon: Autumn Messing III, MD;  Location: [place];  Service: General;  Laterality: N/A;   COLONOSCOPY     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   KIDNEY TRANSPLANT  08/17/2011   [facility]    LAPAROSCOPIC CHOLECYSTECTOMY  10/21/2014   w/IOC   LEFT HEART CATHETERIZATION WITH CORONARY ANGIOGRAM N/A 03/21/2011   Procedure: LEFT HEART CATHETERIZATION WITH CORONARY ANGIOGRAM;  Surgeon: [name], MD;  Location: [facility] CATH LAB;  Service: Cardiovascular;  Laterality: N/A;   NEPHRECTOMY  08/2013   removed transplaned kidney   POLYPECTOMY     POSTERIOR FUSION  CERVICAL SPINE  06/25/2012   for spinal stenosis   VASECTOMY  2010   Family History  Adopted: Yes  Problem Relation Age of Onset   Colon cancer Neg Hx    Esophageal cancer Neg Hx    Rectal cancer Neg Hx    Stomach cancer Neg Hx    Colon polyps Neg Hx    Social History:  reports that he quit smoking about 20 months ago. His smoking use included cigarettes. He started smoking about 30 years ago. He has a 16.00 pack-year smoking history. He has never used smokeless tobacco. He reports that he does not drink alcohol and does not use drugs. Allergies  Allergen Reactions   Codeine Nausea And Vomiting   Hydrocodone-Acetaminophen Nausea And Vomiting   Prior to Admission medications   Medication Sig Start Date End Date Taking? Authorizing Provider  acetaminophen (TYLENOL) 500 MG tablet Take 1 tablet (500 mg total) by mouth every 6 (six) hours as needed for moderate pain. 09/12/14  Yes Samuella Cota, MD  amLODipine (NORVASC) 10 MG tablet Take 1 tablet (10 mg total) by mouth daily. 10/19/21  Yes Ngetich, Kirk C, NP  b complex-vitamin [name]-folic acid (NEPHRO-VITE) 0.8 MG TABS tablet TAKE 1 TABLET BY MOUTH EVERY DAY Patient taking differently: Take 1 tablet by mouth daily. 02/08/20  Yes Reed, Tiffany L, DO  calcitRIOL (ROCALTROL) 0.5 MCG capsule Take 2 capsules (1 mcg total) by mouth every Monday, Wednesday, and Friday with hemodialysis. 06/04/18  Yes Black, Lezlie Octave, NP  Cholecalciferol (VITAMIN D3) 25 MCG (1000 UT) CAPS Take 4,000 Units by mouth daily.   Yes [provider]  diphenhydrAMINE (BENADRYL) 25 MG tablet Take 50 mg by mouth at bedtime.   Yes [provider]  doxazosin (CARDURA) 4 MG tablet Take 1 tablet (4 mg total) by mouth daily. 07/04/21  Yes Ngetich, Kirk C, NP  guaiFENesin (MUCINEX) 600 MG 12 hr tablet Take 1,200 mg by mouth 2 (two) times daily.   Yes [provider]  hydrALAZINE (APRESOLINE) 100 MG tablet TAKE 1 TABLET EVERY MORNINGAND AT BEDTIME. Patient  taking differently: Take 100 mg by mouth 2 (two) times daily as needed (for systolic blood pressure greater than 140). 08/22/21  Yes Hilty, Nadean Corwin, MD  isosorbide mononitrate (IMDUR) 30 MG 24 hr tablet TAKE 1 TABLET DAILY Patient taking differently: Take 30 mg by mouth daily. 11/10/19  Yes Hilty, Nadean Corwin, MD  lidocaine-prilocaine (EMLA) cream Apply 1 application topically See admin instructions. Apply 1 to 2 hours prior to dialysis on Mondays, Wednesdays, and Fridays. Cover with saran wrap. 11/18/18  Yes [provider]  methocarbamol (ROBAXIN) 750 MG tablet Take 1 tablet (750 mg total) by mouth in the morning, at noon, in the evening, and at bedtime. 09/12/21  Yes Ngetich, Kirk C, NP  metoprolol tartrate (LOPRESSOR) 100 MG tablet TAKE 1 TABLET BY MOUTH TWICE A DAY Patient taking differently: Take 100 mg by mouth 2 (  two) times daily. 09/21/20  Yes Hilty, Nadean Corwin, MD  omeprazole (PRILOSEC) 20 MG capsule TAKE 1 CAPSULE TWICE DAILY BEFORE A MEAL Patient taking differently: Take 20 mg by mouth 2 (two) times daily before a meal. 02/09/21  Yes Ngetich, Kirk C, NP  ondansetron (ZOFRAN ODT) 4 MG disintegrating tablet Take 1 tablet (4 mg total) by mouth every 8 (eight) hours as needed for nausea or vomiting. 01/11/21  Yes Bonnielee Haff, MD  pravastatin (PRAVACHOL) 40 MG tablet TAKE 1 TABLET BY MOUTH EVERY DAY IN THE EVENING Patient taking differently: Take 40 mg by mouth daily. 10/19/21  Yes Ngetich, Kirk C, NP  RENVELA 800 MG tablet Take 1,600-2,400 mg by mouth See admin instructions. Take 2,400 mg by mouth three times a day with meals and 1600 mg with each snack 07/06/19  Yes [provider]  traMADol (ULTRAM) 50 MG tablet Take 1 tablet (50 mg total) by mouth daily as needed. Patient taking differently: Take 50 mg by mouth daily as needed for moderate pain. 09/12/21  Yes Ngetich, Kirk C, NP  traZODone (DESYREL) 50 MG tablet TAKE 1/2 TO 1 TABLET BY MOUTH AT BEDTIME AS NEEDED FOR  SLEEP Patient taking differently: Take 50 mg by mouth at bedtime. 10/30/21  Yes Ngetich, Kirk C, NP  vitamin [name] (ASCORBIC ACID) 250 MG tablet Take 1 tablet (250 mg total) by mouth daily. 07/04/21  Yes Ngetich, Kirk C, NP  albuterol (PROVENTIL HFA;VENTOLIN HFA) 108 (90 Base) MCG/ACT inhaler Inhale 2 puffs into the lungs every 6 (six) hours as needed for wheezing or shortness of breath. Patient not taking: Reported on 11/07/2021 06/05/17   Patrecia Pour, MD  cinacalcet (SENSIPAR) 30 MG tablet Take 30 mg by mouth daily. Patient not taking: Reported on 11/07/2021    [provider]  diazepam (VALIUM) 2 MG tablet Take one pill one hour prior to mri and repeat just prior if  needed 08/29/21   Aundra Dubin, PA-[name]  folic acid in sodium chloride 0.9 % 50 mL Inject into the vein. And B-Complex.    [provider]  ipratropium-albuterol (DUONEB) 0.5-2.5 (3) MG/3ML SOLN Take 3 mLs by nebulization every 4 (four) hours as needed (Shortness of breath). Patient not taking: Reported on 11/07/2021 12/05/18   Mariel Aloe, MD  PRESCRIPTION MEDICATION See admin instructions. CPAP- At bedtime and during any time of rest    [provider]   Current Facility-Administered Medications  Medication Dose Route Frequency Provider Last Rate Last Admin   acetaminophen (TYLENOL) tablet 650 mg  650 mg Oral Q6H PRN Etta Quill, DO   650 mg at 11/08/21 1403   Or   acetaminophen (TYLENOL) suppository 650 mg  650 mg Rectal Q6H PRN Etta Quill, DO       alum & mag hydroxide-simeth (MAALOX/MYLANTA) 200-200-20 MG/5ML suspension 15 mL  15 mL Oral Q4H PRN Aline August, MD       amLODipine (NORVASC) tablet 10 mg  10 mg Oral Daily Jennette Kettle M, DO   10 mg at 11/09/21 9702   calcitRIOL (ROCALTROL) capsule 1 mcg  1 mcg Oral Q M,W,F-HD Etta Quill, DO   1 mcg at 11/08/21 2026   Chlorhexidine Gluconate Cloth 2 % PADS 6 each  6 each Topical Q0600 Lumi Winslett, PA       cholecalciferol  (VITAMIN D3) 25 MCG (1000 UNIT) tablet 4,000 Units  4,000 Units Oral Daily Etta Quill, DO   4,000 Units at 11/09/21  6759   doxazosin (CARDURA) tablet 4 mg  4 mg Oral Daily Jennette Kettle M, DO   4 mg at 11/08/21 1029   guaiFENesin (MUCINEX) 12 hr tablet 1,200 mg  1,200 mg Oral BID Jennette Kettle M, DO   1,200 mg at 11/09/21 1638   hydrALAZINE (APRESOLINE) injection 10-20 mg  10-20 mg Intravenous Q4H PRN Etta Quill, DO   10 mg at 11/08/21 0740   isosorbide mononitrate (IMDUR) 24 hr tablet 30 mg  30 mg Oral Daily Jennette Kettle M, DO   30 mg at 11/09/21 4665   labetalol (NORMODYNE) injection 10 mg  10 mg Intravenous Once Gareth Morgan, MD       levETIRAcetam (KEPPRA) 250 mg in sodium chloride 0.9 % 100 mL IVPB  250 mg Intravenous Q M,W,F-1800 Kerney Elbe, MD   Stopped at 11/08/21 2144   levETIRAcetam (KEPPRA) IVPB 500 mg/100 mL premix  500 mg Intravenous Daily Kerney Elbe, MD   Stopped at 11/09/21 1043   lidocaine (PF) (XYLOCAINE) 1 % injection 5 mL  5 mL Intradermal PRN Uldine Fuster, Ria Comment, PA       lidocaine-prilocaine (EMLA) cream 1 Application  1 Application Topical PRN Able Malloy, Ria Comment, PA       methocarbamol (ROBAXIN) tablet 750 mg  750 mg Oral QID Jennette Kettle M, DO   750 mg at 11/09/21 0946   metoprolol tartrate (LOPRESSOR) tablet 100 mg  100 mg Oral BID Etta Quill, DO   100 mg at 11/09/21 9935   multivitamin (RENA-VIT) tablet 1 tablet  1 tablet Oral Daily Jennette Kettle M, DO   1 tablet at 11/08/21 1028   ondansetron (ZOFRAN) tablet 4 mg  4 mg Oral Q6H PRN Etta Quill, DO       Or   ondansetron [facility]) injection 4 mg  4 mg Intravenous Q6H PRN Etta Quill, DO   4 mg at 11/09/21 7017   oxyCODONE (Oxy IR/ROXICODONE) immediate release tablet 5 mg  5 mg Oral Q4H PRN Aline August, MD   5 mg at 11/09/21 0100   pantoprazole (PROTONIX) EC tablet 40 mg  40 mg Oral BID AC Jennette Kettle M, DO   40 mg at 11/09/21 7939   pentafluoroprop-tetrafluoroeth (GEBAUERS)  aerosol 1 Application  1 Application Topical PRN Jenniferann Stuckert, Ria Comment, PA       pravastatin (PRAVACHOL) tablet 40 mg  40 mg Oral Daily Jennette Kettle M, DO   40 mg at 11/09/21 0300   sevelamer carbonate (RENVELA) tablet 1,600 mg  1,600 mg Oral PRN Etta Quill, DO       sevelamer carbonate (RENVELA) tablet 3,200 mg  3,200 mg Oral TID WC Jennette Kettle M, DO   3,200 mg at 11/09/21 0948   traZODone (DESYREL) tablet 50 mg  50 mg Oral QHS PRN Aline August, MD       Labs: Basic Metabolic Panel: Recent Labs  Lab 11/07/21 2144 11/07/21 2150 11/08/21 0415  NA 138 136 138  K 3.5 3.5 3.3*  CL 97* 95* 95*  CO2 26  --  25  GLUCOSE 149* 145* 117*  BUN 40* 36* 41*  CREATININE 11.66* 11.80* 12.02*  CALCIUM 9.3  --  10.2   Liver Function Tests: Recent Labs  Lab 11/07/21 2144  AST 11*  ALT 11  ALKPHOS 55  BILITOT 0.7  PROT 6.5  ALBUMIN 3.2*   CBC: Recent Labs  Lab 11/07/21 2144 11/07/21 2150 11/08/21 0415  WBC 4.6  --  4.5  NEUTROABS  3.6  --   --   HGB 10.3* 11.2* 10.1*  HCT 30.0* 33.0* 30.9*  MCV 92.3  --  94.5  PLT 87*  --  90*   CBG: Recent Labs  Lab 11/08/21 1832  GLUCAP 143*   Studies/Results: CT HEAD WO CONTRAST (5MM)  Result Date: 11/08/2021 CLINICAL DATA:  Follow-up subdural hematoma EXAM: CT HEAD WITHOUT CONTRAST TECHNIQUE: Contiguous axial images were obtained from the base of the skull through the vertex without intravenous contrast. RADIATION DOSE REDUCTION: This exam was performed according to the departmental dose-optimization program which includes automated exposure control, adjustment of the mA and/or kV according to patient size and/or use of iterative reconstruction technique. COMPARISON:  Yesterday FINDINGS: Brain: Mixed density subdural hematoma along the right cerebral convexity, primarily lateral but also minimally seen inferiorly. In the anterior frontal region maximal thickness is 9 mm. No progression when compared on coronal reformats. Mild right  cerebral hemispheric mass effect. No infarct, hydrocephalus, or masslike finding. Vascular: No hyperdense vessel or unexpected calcification. Skull: Normal. Negative for fracture or focal lesion. Sinuses/Orbits: No acute finding. IMPRESSION: Unchanged subdural hematoma along the right cerebral convexity measuring up to 9 mm at the anterior frontal lobe. Electronically Signed   By: Jorje Guild M.D.   On: 11/08/2021 10:12   CT Head Wo Contrast  Result Date: 11/07/2021 CLINICAL DATA:  Headache; seizure-like activity EXAM: CT HEAD WITHOUT CONTRAST TECHNIQUE: Contiguous axial images were obtained from the base of the skull through the vertex without intravenous contrast. RADIATION DOSE REDUCTION: This exam was performed according to the departmental dose-optimization program which includes automated exposure control, adjustment of the mA and/or kV according to patient size and/or use of iterative reconstruction technique. COMPARISON:  None Available. FINDINGS: Brain: Subdural hematoma overlying the right frontal convexity measuring up to 9 mm in greatest radial diameter. Adjacent mass effect on the right frontal lobe with mild loss of sulcation. 1-2 mm of leftward midline shift at the septum pellucidum. Basal cisterns are patent. Normal caliber of the ventricles. No evidence of acute infarct. Mild generalized cerebral atrophy. Mild chronic microvascular ischemic disease. Vascular: No hyperdense vessel or unexpected calcification. Skull: Normal. Negative for fracture or focal lesion. Sinuses/Orbits: No acute finding. Other: None. IMPRESSION: Acute subdural hematoma overlying the right frontal convexity measuring 9 mm in greatest radial diameter. Mild adjacent mass effect and 1-2 mm of leftward midline shift at the septum pellucidum. These results were called by telephone at the time of interpretation on 11/07/2021 at 10:00 pm to provider [facility] , who verbally acknowledged these results. Electronically  Signed   By: Placido Sou M.D.   On: 11/07/2021 22:02    ROS: All others negative except those listed in HPI.   Physical Exam: Vitals:   11/09/21 0915 11/09/21 0949 11/09/21 1000 11/09/21 1045  BP:  (!) 189/82 (!) 186/79 (!) 188/101  Pulse: 69 83 72 63  Resp:   (!) 22 20  Temp:   98.4 F (36.9 [name]) 98.4 F (36.9 [name])  TempSrc:   Oral Oral  SpO2: 100%  100% 100%  Weight:      Height:         General: chronically ill, uncomfortable appearing male laying in bed Head: NCAT sclera not icteric MMM Lungs: Bilateral scattered crackles in lower lung fields. No wheeze or rhonchi. Breathing is unlabored. Heart: RRR. No murmur, rubs or gallops.  Abdomen: soft, nontender, +BS, no guarding, no rebound tenderness Lower extremities: no edema, ischemic changes, or open wounds  Neuro: AAOx3. Moves all extremities spontaneously. Psych:  Responds to questions appropriately with a normal affect. Dialysis Access: LU AVF +b/t  Dialysis Orders:  [facility] MWF  4 hrs 15 min, BFR 450/AF 1.5  EDW 89.9, 2 K/ 2 Ca, UFP 4  Access: LU AVF  Hep 2500   STOP Heparin (has spontaneous subdural hematoma) Sensipar 60 mg qHD Calcitriol 1.25 mcg PO qHD  Assessment/Plan: New onset seizure -Possibly 2/2 Right SDH per neurology. Neurology following. Started on Keppra with renal dosing. Seizure/fall precautions.  Subdural hematoma - Repeat head CT 10/18 showed sustained SDH. Neurosurgery recommends conservative management.    ESRD -  On dialysis MWF. HD 10/18 stopped early due to nausea. Net neg 2L. Plan for HD tomorrow. Supplemental Keppra 250 mg after HD per neurology. No Heparin. Hypokalemia - K 3.3 pre HD, increased K bath used. repeat labs  Hypertension/volume  - Does not appear fluid overloaded, although crackles present on exam.  Plan for UF as tolerated, usually removes around 3L with HD. BP elevated to ~189/80. Continue amlodipine, Imdur, metoprolol.    Anemia of CKD - Hgb 10.1, stable. No indication for ESA.    Secondary Hyperparathyroidism -  Ca 10.2, continue Sensipar 60 mg qHD and calcitriol. Sevelamer with meals.  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

## 2021-11-09 NOTE — Procedures (Signed)
Patient Name: Kirk [name]  MRN: [id number]  Epilepsy Attending: [name]  Referring Physician/Provider: August Albino, NP Date:  11/09/2021 Duration: 23.38 mins  Patient history: 61yo M with new onset seizures. EEG to evaluate for seizure.   Level of alertness: Awake, asleep  AEDs during EEG study: LEV  Technical aspects: This EEG study was done with scalp electrodes positioned according to the 10-20 International system of electrode placement. Electrical activity was reviewed with band pass filter of 1-'70Hz'$ , sensitivity of 7 uV/mm, display speed of 45m/sec with a '60Hz'$  notched filter applied as appropriate. EEG data were recorded continuously and digitally stored.  Video monitoring was available and reviewed as appropriate.  Description: The posterior dominant rhythm consists of 8-'9Hz'$  activity of moderate voltage (25-35 uV) seen predominantly in posterior head regions, symmetric and reactive to eye opening and eye closing. Sleep was characterized by vertex waves, sleep spindles (12 to 14 Hz), maximal frontocentral region.  EEG showed intermittent 3 to 6 Hz theta-delta slowing in right temporal region. Hyperventilation and photic stimulation were not performed.     ABNORMALITY - Intermittent slow, right temporal region  IMPRESSION: This study is suggestive of cortical dysfunction arising from right temporal region likely secondary to underlying SDH. No seizures or epileptiform discharges were seen throughout the recording.  Please note lack of epileptiform activity does not exclude the diagnosis of epilepsy.   [name] OBarbra Sarks

## 2021-11-09 NOTE — Progress Notes (Signed)
EEG with no ongoing seizure, but would still favor ongoing AED as previously recommended. Still with some headache consistent with migraine(h/o same). Will repeat compazine '10mg'$  and benadryl 12.'5mg'$ .   He can follow up with outpatient neurology, no further inpatient recommendations. Please call with further questions or concerns.    [name] Rack, MD [facility] [phone]  If 7pm- 7am, please page neurology on call as listed in [place].

## 2021-11-10 DIAGNOSIS — Q8781 Alport syndrome: Secondary | ICD-10-CM | POA: Diagnosis not present

## 2021-11-10 DIAGNOSIS — N186 End stage renal disease: Secondary | ICD-10-CM | POA: Diagnosis not present

## 2021-11-10 DIAGNOSIS — S065XAA Traumatic subdural hemorrhage with loss of consciousness status unknown, initial encounter: Secondary | ICD-10-CM | POA: Diagnosis not present

## 2021-11-10 DIAGNOSIS — R569 Unspecified convulsions: Secondary | ICD-10-CM | POA: Diagnosis not present

## 2021-11-10 LAB — RENAL FUNCTION PANEL
Albumin: 3.2 g/dL — ABNORMAL LOW (ref 3.5–5.0)
Anion gap: 17 — ABNORMAL HIGH (ref 5–15)
BUN: 56 mg/dL — ABNORMAL HIGH (ref 6–20)
CO2: 27 mmol/L (ref 22–32)
Calcium: 9.9 mg/dL (ref 8.9–10.3)
Chloride: 90 mmol/L — ABNORMAL LOW (ref 98–111)
Creatinine, Ser: 12.63 mg/dL — ABNORMAL HIGH (ref 0.61–1.24)
GFR, Estimated: 4 mL/min — ABNORMAL LOW (ref >60)
Glucose, Bld: 120 mg/dL — ABNORMAL HIGH (ref 70–99)
Phosphorus: 6.4 mg/dL — ABNORMAL HIGH (ref 2.5–4.6)
Potassium: 4.5 mmol/L (ref 3.5–5.1)
Sodium: 134 mmol/L — ABNORMAL LOW (ref 135–145)

## 2021-11-10 LAB — CBC
HCT: 28.1 % — ABNORMAL LOW (ref 39.0–52.0)
Hemoglobin: 9.7 g/dL — ABNORMAL LOW (ref 13.0–17.0)
MCH: 31.7 pg (ref 26.0–34.0)
MCHC: 34.5 g/dL (ref 30.0–36.0)
MCV: 91.8 fL (ref 80.0–100.0)
Platelets: 79 10*3/uL — ABNORMAL LOW (ref 150–400)
RBC: 3.06 MIL/uL — ABNORMAL LOW (ref 4.22–5.81)
RDW: 13.2 % (ref 11.5–15.5)
WBC: 3.9 10*3/uL — ABNORMAL LOW (ref 4.0–10.5)
nRBC: 0 % (ref 0.0–0.2)

## 2021-11-10 LAB — TROPONIN I (HIGH SENSITIVITY)
Troponin I (High Sensitivity): 23 ng/L — ABNORMAL HIGH (ref <18)
Troponin I (High Sensitivity): 24 ng/L — ABNORMAL HIGH (ref <18)

## 2021-11-10 LAB — HEPATITIS B SURFACE ANTIBODY, QUANTITATIVE: Hep B S AB Quant (Post): 11 m[IU]/mL (ref >9.9)

## 2021-11-10 LAB — HEPATITIS B SURFACE ANTIBODY,QUALITATIVE

## 2021-11-10 LAB — HEPATITIS B CORE ANTIBODY, TOTAL

## 2021-11-10 MED ORDER — ALTEPLASE 2 MG IJ SOLR: 2.0000 mg | Freq: Once | INTRAMUSCULAR | Status: DC | PRN

## 2021-11-10 MED ORDER — LEVETIRACETAM 250 MG PO TABS: 250.0000 mg | ORAL_TABLET | ORAL | Status: DC

## 2021-11-10 MED ORDER — PENTAFLUOROPROP-TETRAFLUOROETH EX AERO: 1.0000 | INHALATION_SPRAY | CUTANEOUS | Status: DC | PRN

## 2021-11-10 MED ORDER — NITROGLYCERIN 0.4 MG SL SUBL: 0.4000 mg | SUBLINGUAL_TABLET | SUBLINGUAL | Status: DC | PRN

## 2021-11-10 MED ORDER — ANTICOAGULANT SODIUM CITRATE 4% (200MG/5ML) IV SOLN: 5.0000 mL | Status: DC | PRN

## 2021-11-10 MED ORDER — HEPARIN SODIUM (PORCINE) 1000 UNIT/ML DIALYSIS: 1000.0000 [IU] | INTRAMUSCULAR | Status: DC | PRN

## 2021-11-10 MED ORDER — LIDOCAINE HCL (PF) 1 % IJ SOLN: 5.0000 mL | INTRAMUSCULAR | Status: DC | PRN

## 2021-11-10 MED ORDER — NITROGLYCERIN 0.4 MG SL SUBL
SUBLINGUAL_TABLET | SUBLINGUAL | Status: AC
  Filled 2021-11-10: qty 1

## 2021-11-10 MED ORDER — LEVETIRACETAM 500 MG PO TABS
500.0000 mg | ORAL_TABLET | Freq: Every day | ORAL | Status: DC
  Administered 2021-11-10 – 2021-11-11 (×2): 500 mg via ORAL
  Filled 2021-11-10 (×2): qty 1

## 2021-11-10 MED ORDER — LIDOCAINE-PRILOCAINE 2.5-2.5 % EX CREA: 1.0000 | TOPICAL_CREAM | CUTANEOUS | Status: DC | PRN

## 2021-11-10 NOTE — Progress Notes (Addendum)
PROGRESS NOTE    Kirk [name]  HFW:[id number] DOB: 10-10-1960 DOA: 11/07/2021 PCP: Sandrea Hughs, NP   Brief Narrative:  61 year old male with history of end-stage renal disease on hemodialysis, Alport syndrome, hypertension, chronic thrombocytopenia, anemia of chronic disease, chronic headaches, hyperlipidemia, OSA on CPAP, pancytopenia, diabetes mellitus type 2, memory impairment, chronic diastolic CHF, chronic bilateral upper extremity and lower extremity weakness since C-spine surgery in February 2023 presented with new onset seizure at home lasting for 2 minutes.  On presentation, CT of the head showed subdural hematoma.  Neurology and neurosurgery were consulted.  He was started on Keppra.  Assessment & Plan:  New onset seizures -Possibly due to right subdural hematoma as per neurology.  Neurology following.  Has been started on Keppra, renally dosed. -Seizure/fall precautions. -Neurology signed off on 03/12/2021 and recommended to continue current dose of Keppra with outpatient follow-up with neurology.  Subdural hematoma -Repeat CT of the head still showed some subdural hematoma.  Neurosurgery recommended conservative management.  End-stage renal disease on hemodialysis -Nephrology following.  Dialysis as per nephrology schedule.  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  Continue amlodipine, Imdur, metoprolol  OSA on CPAP -Continue CPAP at night  Anemia of chronic disease -From renal failure.  Hemoglobin stable.  Monitor intermittently  Chronic thrombocytopenia -Platelets stable.  Monitor intermittently  Hypokalemia -No labs today.  GERD -Continue Protonix  Hyperlipidemia -Continue statin  Diabetes mellitus type 2--start CBGs with SSI  Obesity -Outpatient follow-up  Chronic diastolic CHF -Volume managed by dialysis.  Strict input and output.  Daily weights.  Fluid  restriction.  Physical deconditioning -PT recommends outpatient PT.  DVT prophylaxis: SCDs Code Status: Full Family Communication: Spoke to wife on phone Disposition Plan: Status is: Inpatient Remains inpatient appropriate because: Of severity of illness  Consultants: Neurology/nephrology/neurosurgery  Procedures: None  Antimicrobials: None   Subjective: Patient seen and examined at bedside.  Poor historian.  Does not feel well.  Still complains of intermittent headache and some nausea.  No current vomiting.  No chest pain, worsening shortness breath reported.    Objective: Vitals:   11/09/21 2332 11/09/21 2357 11/10/21 0258 11/10/21 0808  BP:  (!) 159/76 (!) 180/76 (!) 178/66  Pulse: 75 63 66 74  Resp: '16 17 17 16  '$ Temp:  97.7 F (36.5 C) 98.6 F (37 C) (!) 97.5 F (36.4 C)  TempSrc:  Axillary Oral Oral  SpO2: 97% 95% 97% 98%  Weight:      Height:        Intake/Output Summary (Last 24 hours) at 11/10/2021 0922 Last data filed at 11/10/2021 0277 Gross per 24 hour  Intake 1277.89 ml  Output 0 ml  Net 1277.89 ml    Filed Weights   11/07/21 2053  Weight: 90.7 kg    Examination:  General: On room air.  No distress.  Looks chronically ill and deconditioned. ENT/neck: No thyromegaly.  JVD is not elevated  respiratory: Decreased breath sounds at bases bilaterally with some crackles; no wheezing  CVS: S1-S2 heard, rate controlled currently Abdominal: Soft, obese, nontender, slightly distended; no organomegaly, bowel sounds are heard Extremities: Trace lower extremity edema; no cyanosis  CNS: Awake, slow to respond, answers some questions.  Poor historian.  No focal neurologic deficit.  Moves extremities Lymph: No obvious lymphadenopathy Skin: No obvious ecchymosis/lesions  psych: Mostly flat affect.  No signs of agitation.   Musculoskeletal: No obvious joint swelling/deformity    Data  Reviewed: I have personally reviewed following labs and imaging  studies  CBC: Recent Labs  Lab 11/07/21 2144 11/07/21 2150 11/08/21 0415  WBC 4.6  --  4.5  NEUTROABS 3.6  --   --   HGB 10.3* 11.2* 10.1*  HCT 30.0* 33.0* 30.9*  MCV 92.3  --  94.5  PLT 87*  --  90*    Basic Metabolic Panel: Recent Labs  Lab 11/07/21 2144 11/07/21 2150 11/08/21 0415 2021-11-19 1231  NA 138 136 138  --   K 3.5 3.5 3.3* 4.2  CL 97* 95* 95*  --   CO2 26  --  25  --   GLUCOSE 149* 145* 117*  --   BUN 40* 36* 41*  --   CREATININE 11.66* 11.80* 12.02*  --   CALCIUM 9.3  --  10.2  --     GFR: Estimated Creatinine Clearance: 7.1 mL/min (A) (by C-G formula based on SCr of 12.02 mg/dL (H)). Liver Function Tests: Recent Labs  Lab 11/07/21 2144  AST 11*  ALT 11  ALKPHOS 55  BILITOT 0.7  PROT 6.5  ALBUMIN 3.2*    No results for input(s): "LIPASE", "AMYLASE" in the last 168 hours. No results for input(s): "AMMONIA" in the last 168 hours. Coagulation Profile: No results for input(s): "INR", "PROTIME" in the last 168 hours. Cardiac Enzymes: No results for input(s): "CKTOTAL", "CKMB", "CKMBINDEX", "TROPONINI" in the last 168 hours. BNP (last 3 results) No results for input(s): "PROBNP" in the last 8760 hours. HbA1C: No results for input(s): "HGBA1C" in the last 72 hours. CBG: Recent Labs  Lab 11/08/21 1832  GLUCAP 143*    Lipid Profile: No results for input(s): "CHOL", "HDL", "LDLCALC", "TRIG", "CHOLHDL", "LDLDIRECT" in the last 72 hours. Thyroid Function Tests: No results for input(s): "TSH", "T4TOTAL", "FREET4", "T3FREE", "THYROIDAB" in the last 72 hours. Anemia Panel: No results for input(s): "VITAMINB12", "FOLATE", "FERRITIN", "TIBC", "IRON", "RETICCTPCT" in the last 72 hours. Sepsis Labs: No results for input(s): "PROCALCITON", "LATICACIDVEN" in the last 168 hours.  No results found for this or any previous visit (from the past 240 hour(s)).       Radiology Studies: EEG adult  Result Date: 2021/11/19 [name], MD      19-Nov-2021  3:18 PM Patient Name: MASAO JUNKER MRN: [id number] Epilepsy Attending: [name] Referring Physician/Provider: August Albino, NP Date:  11-19-2021 Duration: 23.38 mins Patient history: 61yo M with new onset seizures. EEG to evaluate for seizure. Level of alertness: Awake, asleep AEDs during EEG study: LEV Technical aspects: This EEG study was done with scalp electrodes positioned according to the 10-20 International system of electrode placement. Electrical activity was reviewed with band pass filter of 1-'70Hz'$ , sensitivity of 7 uV/mm, display speed of 10m/sec with a '60Hz'$  notched filter applied as appropriate. EEG data were recorded continuously and digitally stored.  Video monitoring was available and reviewed as appropriate. Description: The posterior dominant rhythm consists of 8-'9Hz'$  activity of moderate voltage (25-35 uV) seen predominantly in posterior head regions, symmetric and reactive to eye opening and eye closing. Sleep was characterized by vertex waves, sleep spindles (12 to 14 Hz), maximal frontocentral region.  EEG showed intermittent 3 to 6 Hz theta-delta slowing in right temporal region. Hyperventilation and photic stimulation were not performed.   ABNORMALITY - Intermittent slow, right temporal region IMPRESSION: This study is suggestive of cortical dysfunction arising from right temporal region likely secondary to underlying SDH. No seizures or epileptiform discharges were seen throughout  the recording. Please note lack of epileptiform activity does not exclude the diagnosis of epilepsy. Priyanka Barbra Sarks   CT HEAD WO CONTRAST (5MM)  Result Date: 11/08/2021 CLINICAL DATA:  Follow-up subdural hematoma EXAM: CT HEAD WITHOUT CONTRAST TECHNIQUE: Contiguous axial images were obtained from the base of the skull through the vertex without intravenous contrast. RADIATION DOSE REDUCTION: This exam was performed according to the departmental dose-optimization program which includes  automated exposure control, adjustment of the mA and/or kV according to patient size and/or use of iterative reconstruction technique. COMPARISON:  Yesterday FINDINGS: Brain: Mixed density subdural hematoma along the right cerebral convexity, primarily lateral but also minimally seen inferiorly. In the anterior frontal region maximal thickness is 9 mm. No progression when compared on coronal reformats. Mild right cerebral hemispheric mass effect. No infarct, hydrocephalus, or masslike finding. Vascular: No hyperdense vessel or unexpected calcification. Skull: Normal. Negative for fracture or focal lesion. Sinuses/Orbits: No acute finding. IMPRESSION: Unchanged subdural hematoma along the right cerebral convexity measuring up to 9 mm at the anterior frontal lobe. Electronically Signed   By: Jorje Guild M.D.   On: 11/08/2021 10:12        Scheduled Meds:  amLODipine  10 mg Oral Daily   calcitRIOL  1 mcg Oral Q M,W,F-HD   Chlorhexidine Gluconate Cloth  6 each Topical Q0600   cholecalciferol  4,000 Units Oral Daily   doxazosin  4 mg Oral Daily   guaiFENesin  1,200 mg Oral BID   isosorbide mononitrate  30 mg Oral Daily   labetalol  10 mg Intravenous Once   methocarbamol  750 mg Oral QID   metoprolol tartrate  100 mg Oral BID   multivitamin  1 tablet Oral Daily   pantoprazole  40 mg Oral BID AC   pravastatin  40 mg Oral Daily   sevelamer carbonate  3,200 mg Oral TID WC   Continuous Infusions:  levETIRAcetam Stopped (11/08/21 2144)   levETIRAcetam Stopped (11/09/21 1043)          Aline August, MD [facility] 11/10/2021, 9:22 AM

## 2021-11-10 NOTE — Progress Notes (Signed)
Pt requires a bedside commode as he has difficulty ambulating in a timely manner to his home bathroom.

## 2021-11-10 NOTE — Progress Notes (Signed)
Subjective:  no significant events overnight-  due for HD today.  Neurology rec cont anti sz meds and have signed off    Objective Vital signs in last 24 hours: Vitals:   11/09/21 2332 11/09/21 2357 11/10/21 0258 11/10/21 0808  BP:  (!) 159/76 (!) 180/76 (!) 178/66  Pulse: 75 63 66 74  Resp: '16 17 17 16  '$ Temp:  97.7 F (36.5 C) 98.6 F (37 C) (!) 97.5 F (36.4 C)  TempSrc:  Axillary Oral Oral  SpO2: 97% 95% 97% 98%  Weight:      Height:       Weight change:   Intake/Output Summary (Last 24 hours) at 11/10/2021 0905 Last data filed at 11/10/2021 4782 Gross per 24 hour  Intake 1277.89 ml  Output 0 ml  Net 1277.89 ml    Dialysis Orders:  [facility] MWF  4 hrs 15 min, BFR 450/AF 1.5  EDW 89.9, 2 K/ 2 Ca, UFP 4   Access: LU AVF  Hep 2500   STOP Heparin (has spontaneous subdural hematoma) Sensipar 60 mg qHD Calcitriol 1.25 mcg PO qHD   Assessment/Plan: New onset seizure -Possibly 2/2 Right SDH per neurology. Neurology following. Started on Keppra with renal dosing. Seizure/fall precautions.  Subdural hematoma - Repeat head CT 10/18 showed sustained SDH. Neurosurgery recommends conservative management.    ESRD -  On dialysis MWF. HD 10/18 stopped early due to nausea. Net neg 2L. Plan for HD today on schedule. Supplemental Keppra 250 mg after HD per neurology. No Heparin. Hypokalemia - K 3.3 pre HD, increased K bath used.   Hypertension/volume  - Does not appear fluid overloaded, although crackles present on exam.  Plan for UF as tolerated, usually removes around 3L with HD. BP elevated to ~189/80. Continue amlodipine, Imdur, metoprolol.    Anemia of CKD - Hgb 10.1, stable. No indication for ESA.   Secondary Hyperparathyroidism -  Ca 10.2, continue Sensipar 60 mg qHD and calcitriol. Sevelamer with meals.  Nutrition -  Renal diet with fluid restriction   OSA on CPAP - CPAP at night Chronic thrombocytopenia  - stable    Louis Meckel    Labs: Basic Metabolic  Panel: Recent Labs  Lab 11/07/21 2144 11/07/21 2150 11/08/21 0415 11/09/21 1231  NA 138 136 138  --   K 3.5 3.5 3.3* 4.2  CL 97* 95* 95*  --   CO2 26  --  25  --   GLUCOSE 149* 145* 117*  --   BUN 40* 36* 41*  --   CREATININE 11.66* 11.80* 12.02*  --   CALCIUM 9.3  --  10.2  --    Liver Function Tests: Recent Labs  Lab 11/07/21 2144  AST 11*  ALT 11  ALKPHOS 55  BILITOT 0.7  PROT 6.5  ALBUMIN 3.2*   No results for input(s): "LIPASE", "AMYLASE" in the last 168 hours. No results for input(s): "AMMONIA" in the last 168 hours. CBC: Recent Labs  Lab 11/07/21 2144 11/07/21 2150 11/08/21 0415  WBC 4.6  --  4.5  NEUTROABS 3.6  --   --   HGB 10.3* 11.2* 10.1*  HCT 30.0* 33.0* 30.9*  MCV 92.3  --  94.5  PLT 87*  --  90*   Cardiac Enzymes: No results for input(s): "CKTOTAL", "CKMB", "CKMBINDEX", "TROPONINI" in the last 168 hours. CBG: Recent Labs  Lab 11/08/21 1832  GLUCAP 143*    Iron Studies: No results for input(s): "IRON", "TIBC", "TRANSFERRIN", "FERRITIN" in the last  72 hours. Studies/Results: EEG adult  Result Date: 11/09/2021 [name], MD     11/09/2021  3:18 PM Patient Name: Kirk [name] MRN: [id number] Epilepsy Attending: [name] Referring Physician/Provider: August Albino, NP Date:  11/09/2021 Duration: 23.38 mins Patient history: 61yo M with new onset seizures. EEG to evaluate for seizure. Level of alertness: Awake, asleep AEDs during EEG study: LEV Technical aspects: This EEG study was done with scalp electrodes positioned according to the 10-20 International system of electrode placement. Electrical activity was reviewed with band pass filter of 1-'70Hz'$ , sensitivity of 7 uV/mm, display speed of 37m/sec with a '60Hz'$  notched filter applied as appropriate. EEG data were recorded continuously and digitally stored.  Video monitoring was available and reviewed as appropriate. Description: The posterior dominant rhythm consists of 8-'9Hz'$  activity  of moderate voltage (25-35 uV) seen predominantly in posterior head regions, symmetric and reactive to eye opening and eye closing. Sleep was characterized by vertex waves, sleep spindles (12 to 14 Hz), maximal frontocentral region.  EEG showed intermittent 3 to 6 Hz theta-delta slowing in right temporal region. Hyperventilation and photic stimulation were not performed.   ABNORMALITY - Intermittent slow, right temporal region IMPRESSION: This study is suggestive of cortical dysfunction arising from right temporal region likely secondary to underlying SDH. No seizures or epileptiform discharges were seen throughout the recording. Please note lack of epileptiform activity does not exclude the diagnosis of epilepsy. [name]  CT HEAD WO CONTRAST (5MM)  Result Date: 11/08/2021 CLINICAL DATA:  Follow-up subdural hematoma EXAM: CT HEAD WITHOUT CONTRAST TECHNIQUE: Contiguous axial images were obtained from the base of the skull through the vertex without intravenous contrast. RADIATION DOSE REDUCTION: This exam was performed according to the departmental dose-optimization program which includes automated exposure control, adjustment of the mA and/or kV according to patient size and/or use of iterative reconstruction technique. COMPARISON:  Yesterday FINDINGS: Brain: Mixed density subdural hematoma along the right cerebral convexity, primarily lateral but also minimally seen inferiorly. In the anterior frontal region maximal thickness is 9 mm. No progression when compared on coronal reformats. Mild right cerebral hemispheric mass effect. No infarct, hydrocephalus, or masslike finding. Vascular: No hyperdense vessel or unexpected calcification. Skull: Normal. Negative for fracture or focal lesion. Sinuses/Orbits: No acute finding. IMPRESSION: Unchanged subdural hematoma along the right cerebral convexity measuring up to 9 mm at the anterior frontal lobe. Electronically Signed   By: JJorje GuildM.D.   On:  11/08/2021 10:12   Medications: Infusions:  levETIRAcetam Stopped (11/08/21 2144)   levETIRAcetam Stopped (11/09/21 1043)    Scheduled Medications:  amLODipine  10 mg Oral Daily   calcitRIOL  1 mcg Oral Q M,W,F-HD   Chlorhexidine Gluconate Cloth  6 each Topical Q0600   cholecalciferol  4,000 Units Oral Daily   doxazosin  4 mg Oral Daily   guaiFENesin  1,200 mg Oral BID   isosorbide mononitrate  30 mg Oral Daily   labetalol  10 mg Intravenous Once   methocarbamol  750 mg Oral QID   metoprolol tartrate  100 mg Oral BID   multivitamin  1 tablet Oral Daily   pantoprazole  40 mg Oral BID AC   pravastatin  40 mg Oral Daily   sevelamer carbonate  3,200 mg Oral TID WC    have reviewed scheduled and prn medications.  Physical Exam: General:  lying in bed-  many emesis bags-  on CPAP-  denies complaint Heart: RRR Lungs: mostly clear Abdomen: soft,  non tender Extremities: no edema Dialysis Access: L AVF- patent     11/10/2021,9:05 AM  LOS: 2 days

## 2021-11-10 NOTE — TOC Initial Note (Signed)
Transition of Care [facility]) - Initial/Assessment Note    Patient Details  Name: Kirk [name] MRN: [id number] Date of Birth: [birth year]  [facility] CM/SW Contact:    Pollie Friar, RN Phone Number: 11/10/2021, 1:08 PM  Clinical Narrative:                 Pt is from home with his spouse. Pt states she is with him most of the time.  Pt is interested in [facility] for home. DME will be delivered to the room per [employer]. Pt states his spouse provides needed transportation. She also oversees his medications at home. They deny any issues.  Recommendations for outpatient therapy. They prefer to attend at [facility]. Information on the AVS.  Wife will transport home when medically ready.  Expected Discharge Plan: OP Rehab Barriers to Discharge: Continued Medical Work up   Patient Goals and CMS Choice     Choice offered to / list presented to : Patient, Spouse  Expected Discharge Plan and Services Expected Discharge Plan: OP Rehab   Discharge Planning Services: CM Consult   Living arrangements for the past 2 months: Single Family Home                 DME Arranged: Bedside commode DME Agency: [employer] Date DME Agency Contacted: 11/10/21   Representative spoke with at DME Agency: Annie Sable            Prior Living Arrangements/Services Living arrangements for the past 2 months: [place] Lives with:: Spouse Patient language and need for interpreter reviewed:: Yes Do you feel safe going back to the place where you live?: Yes      Need for Family Participation in Patient Care: Yes (Comment) Care giver support system in place?: Yes (comment) Current home services: DME (CPAP/ walker/ cane) Criminal Activity/Legal Involvement Pertinent to Current Situation/Hospitalization: No - Comment as needed  Activities of Daily Living Home Assistive Devices/Equipment: None ADL Screening (condition at time of admission) Patient's cognitive ability adequate  to safely complete daily activities?: Yes Is the patient deaf or have difficulty hearing?: Yes Does the patient have difficulty seeing, even when wearing glasses/contacts?: No Does the patient have difficulty concentrating, remembering, or making decisions?: No Patient able to express need for assistance with ADLs?: Yes Does the patient have difficulty dressing or bathing?: No Independently performs ADLs?: Yes (appropriate for developmental age) Does the patient have difficulty walking or climbing stairs?: No Weakness of Legs: None Weakness of Arms/Hands: None  Permission Sought/Granted                  Emotional Assessment Appearance:: Appears stated age Attitude/Demeanor/Rapport: Engaged Affect (typically observed): Accepting Orientation: : Oriented to Self, Oriented to Place, Oriented to  Time, Oriented to Situation   Psych Involvement: No (comment)  Admission diagnosis:  Seizure ([place]) [R56.9] SDH (subdural hematoma) ([place]) [S06.5XAA] Seizure-like activity ([place]) [R56.9] Patient Active Problem List   Diagnosis Date Noted   SDH (subdural hematoma) ([place]) 11/08/2021   Seizure ([place]) 11/08/2021   Chronic left shoulder pain 09/21/2021   Chronic right shoulder pain 09/21/2021   Cough with hemoptysis 08/01/2021   Preop respiratory exam 03/02/2021   Intractable nausea and vomiting 01/09/2021   Abnormal CT of the chest 05/24/2020   Rotator cuff syndrome of right shoulder 75/10/2583   Chronic systolic heart failure ([place]) 09/25/2018   Fluid overload 09/17/2018   Hypertensive urgency 09/17/2018   Right knee pain 06/02/2018   Right tibial fracture 06/02/2018  Cigarette smoker 07/11/2017   Anxiety 05/30/2017   Hypokalemia    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   Shunt malfunction 07/03/2016   Need for hepatitis C screening test 06/29/2015   Myalgia 05/17/2015   Secondary central sleep apnea 12/09/2014   Obesity hypoventilation syndrome ([place]) 12/09/2014   Narcotic drug use 12/09/2014    Hypersomnia with sleep apnea 12/09/2014   SCC (squamous cell carcinoma), arm 11/09/2014   Mild persistent chronic asthma without complication 09/32/6712   ESRD on dialysis ([place]) 09/09/2014   Essential hypertension 09/09/2014   HLD (hyperlipidemia) 09/09/2014   Pancytopenia ([place]) 04/28/2014   Thrombocytopenia ([place]) 04/10/2014   Fungal dermatitis 03/17/2014   S/p nephrectomy 09/17/2013   Anemia 09/03/2012   Uncontrolled hypertension    Tension headache    Memory loss    Edema    Thoracic or lumbosacral neuritis or radiculitis 03/27/2012   Nerve root pain 03/27/2012   History of kidney transplant 09/10/2011   Type 2 diabetes mellitus with diabetic nephropathy, without long-term current use of insulin ([place]) 08/30/2011   Hearing loss 08/16/2011   Obesity 08/16/2011   OSA on CPAP 08/05/2011   GIB (gastrointestinal bleeding) 10/28/2007   PCP:  Sandrea Hughs, NP Pharmacy:   [id number] [place] [address] [place] [place]Phone: [phone]: [phone] [facility] [place] [place]Phone: [phone]: [phone] [place] [address] [address]Phone: [phone]: [phone]    Social Determinants of Health (SDOH) Interventions    Readmission Risk Interventions     No data to display

## 2021-11-10 NOTE — Evaluation (Signed)
Physical Therapy Evaluation Patient Details Name: Kirk [name] MRN: [id number] DOB: 19-Apr-1960 Today's Date: 11/10/2021  History of Present Illness  Pt is a 61 y.o. male who presented to the ED 10/17 with seizure-like activity. CT revealed R frontal SDH. PMH:  ESRD on HD, anemia of CKD, carpal tunnel syndrome, chronic headaches, HTN, gout, chronic bilateral upper and lower extremity weakness due to cervical spine pathology (c spine sx 06/2012 and 02/2021), prior renal transplant, OSA on CPAP and secondary hyperparathyroidism   Clinical Impression  Pt admitted with above diagnosis. PTA pt lived at home with his wife and daughter, mod I mobility but reports frequent falls. Pt currently with functional limitations due to the deficits listed below (see PT Problem List). On eval, he required min guard assist transfers and ambulation 120' with RW.  Initially attempted amb without AD but pt very unsteady. RW provided and improved stability noted. Pt reporting headache. Walking with his eyes closed in hallway due to bright lights, resulting in R drift. Cues needed to keep his eyes open and stay in frame of RW. Mild weakness BUE/LE, but symmetrical. Pt will benefit from skilled PT to increase their independence and safety with mobility to allow discharge to the venue listed below.          Recommendations for follow up therapy are one component of a multi-disciplinary discharge planning process, led by the attending physician.  Recommendations may be updated based on patient status, additional functional criteria and insurance authorization.  Follow Up Recommendations Outpatient PT      Assistance Recommended at Discharge Frequent or constant Supervision/Assistance  Patient can return home with the following  A little help with walking and/or transfers;A little help with bathing/dressing/bathroom;Assistance with cooking/housework;Assist for transportation;Help with stairs or ramp for entrance     Equipment Recommendations Rolling walker (2 wheels);BSC/3in1  Recommendations for Other Services       Functional Status Assessment Patient has had a recent decline in their functional status and demonstrates the ability to make significant improvements in function in a reasonable and predictable amount of time.     Precautions / Restrictions Precautions Precautions: Fall      Mobility  Bed Mobility Overal bed mobility: Modified Independent                  Transfers Overall transfer level: Needs assistance Equipment used: Ambulation equipment used Transfers: Sit to/from Stand Sit to Stand: Min guard           General transfer comment: increased time to stabilize balance, impulsive    Ambulation/Gait Ambulation/Gait assistance: Min guard Gait Distance (Feet): 120 Feet Assistive device: Rolling walker (2 wheels) Gait Pattern/deviations: Step-through pattern, Decreased stride length, Drifts right/left Gait velocity: fast, unsafe     General Gait Details: Unsafe gait. Attempting to walk with his eyes closed due to headache and bright lights in hallway. Unsteady but improved with use of RW. Cues to stay in frame of RW. Pt reporting he felt like his L leg was giving out. No buckling noted  Stairs            Wheelchair Mobility    Modified Rankin (Stroke Patients Only) Modified Rankin (Stroke Patients Only) Pre-Morbid Rankin Score: Slight disability Modified Rankin: Moderately severe disability     Balance Overall balance assessment: Needs assistance Sitting-balance support: No upper extremity supported, Feet supported Sitting balance-Leahy Scale: Good     Standing balance support: No upper extremity supported, Bilateral upper extremity supported, During functional activity Standing  balance-Leahy Scale: Poor                               Pertinent Vitals/Pain Pain Assessment Pain Assessment: Faces Faces Pain Scale: Hurts even  more Pain Location: headache Pain Descriptors / Indicators: Headache Pain Intervention(s): Limited activity within patient's tolerance, Monitored during session    Home Living Family/patient expects to be discharged to:: Private residence Living Arrangements: Spouse/significant other;Children Available Help at Discharge: Family;Available 24 hours/day Type of Home: House Home Access: Stairs to enter Entrance Stairs-Rails: Right Entrance Stairs-Number of Steps: 3   Home Layout: One level Home Equipment: Cane - single point Additional Comments: Pt reports his plumbing is bad and is bathroom is not functional. He states he goes to a nearby store for bathroom needs. He no longer makes urine (on HD).    Prior Function Prior Level of Function : Independent/Modified Independent;Driving;History of Falls (last six months)             Mobility Comments: no AD at baseline. Frequent falls. ADLs Comments: sponge bathes     Hand Dominance        Extremity/Trunk Assessment   Upper Extremity Assessment Upper Extremity Assessment: Generalized weakness (symmetrical)    Lower Extremity Assessment Lower Extremity Assessment: Generalized weakness (symmetrical)    Cervical / Trunk Assessment Cervical / Trunk Assessment: Neck Surgery (h/o sx 2014 and 2023)  Communication   Communication: No difficulties  Cognition Arousal/Alertness: Awake/alert Behavior During Therapy: Impulsive, Flat affect Overall Cognitive Status: No family/caregiver present to determine baseline cognitive functioning Area of Impairment: Attention, Following commands, Safety/judgement, Problem solving                   Current Attention Level: Selective   Following Commands: Follows one step commands consistently, Follows multi-step commands inconsistently Safety/Judgement: Decreased awareness of safety   Problem Solving: Difficulty sequencing, Requires verbal cues          General Comments       Exercises     Assessment/Plan    PT Assessment Patient needs continued PT services  PT Problem List Decreased strength;Decreased mobility;Decreased safety awareness;Obesity;Decreased activity tolerance;Pain;Decreased balance;Decreased knowledge of use of DME       PT Treatment Interventions DME instruction;Therapeutic activities;Gait training;Therapeutic exercise;Patient/family education;Balance training;Stair training;Functional mobility training    PT Goals (Current goals can be found in the Care Plan section)  Acute Rehab PT Goals Patient Stated Goal: home PT Goal Formulation: With patient Time For Goal Achievement: 11/24/21 Potential to Achieve Goals: Good    Frequency Min 4X/week     Co-evaluation               AM-PAC PT "6 Clicks" Mobility  Outcome Measure Help needed turning from your back to your side while in a flat bed without using bedrails?: None Help needed moving from lying on your back to sitting on the side of a flat bed without using bedrails?: None Help needed moving to and from a bed to a chair (including a wheelchair)?: A Little Help needed standing up from a chair using your arms (e.g., wheelchair or bedside chair)?: A Little Help needed to walk in hospital room?: A Little Help needed climbing 3-5 steps with a railing? : A Lot 6 Click Score: 19    End of Session Equipment Utilized During Treatment: Gait belt Activity Tolerance: Patient tolerated treatment well Patient left: in bed;with call bell/phone within reach;with bed alarm set Nurse  Communication: Mobility status PT Visit Diagnosis: Unsteadiness on feet (R26.81);Muscle weakness (generalized) (M62.81);Pain    Time: 0806-0829 PT Time Calculation (min) (ACUTE ONLY): 23 min   Charges:   PT Evaluation $PT Eval Moderate Complexity: 1 Mod PT Treatments $Gait Training: 8-22 mins        Lorrin Goodell, PT  Office # [phone] Pager [phone]   Lorriane Shire 11/10/2021, 8:47 AM

## 2021-11-10 NOTE — Progress Notes (Signed)
   11/10/21 2030  Vitals  Temp 97.9 F (36.6 C)  BP (!) 164/59  MAP (mmHg) 91  Pulse Rate 79  ECG Heart Rate 78  Resp 18  Post Treatment  Dialyzer Clearance Lightly streaked  Duration of HD Treatment -hour(s) 3.22 hour(s)  Liters Processed 76.7  Fluid Removed 1800 mL  Tolerated HD Treatment No (Comment)  Post-Hemodialysis Comments PT sign out AMA 40 min.s early  AVG/AVF Arterial Site Held (minutes) 10 minutes  AVG/AVF Venous Site Held (minutes) 10 minutes   PT signed out AMA, could not take any more TX

## 2021-11-10 NOTE — Care Management Important Message (Signed)
Important Message  Patient Details  Name: Kirk [name] MRN: [id number] Date of Birth: 09-Feb-1960   Medicare Important Message Given:  Yes     Jamille Yoshino 11/10/2021, 2:55 PM

## 2021-11-11 ENCOUNTER — Inpatient Hospital Stay (HOSPITAL_COMMUNITY): Payer: Medicare Other

## 2021-11-11 DIAGNOSIS — S065XAA Traumatic subdural hemorrhage with loss of consciousness status unknown, initial encounter: Secondary | ICD-10-CM | POA: Diagnosis not present

## 2021-11-11 MED ORDER — POLYETHYLENE GLYCOL 3350 17 G PO PACK: 17.0000 g | PACK | Freq: Every day | ORAL | Status: DC | PRN

## 2021-11-11 MED ORDER — SENNOSIDES-DOCUSATE SODIUM 8.6-50 MG PO TABS
1.0000 | ORAL_TABLET | Freq: Two times a day (BID) | ORAL | Status: DC
  Administered 2021-11-11 – 2021-11-20 (×13): 1 via ORAL
  Filled 2021-11-11 (×14): qty 1

## 2021-11-11 MED ORDER — ORAL CARE MOUTH RINSE: 15.0000 mL | OROMUCOSAL | Status: DC | PRN

## 2021-11-11 MED ORDER — PROCHLORPERAZINE EDISYLATE 10 MG/2ML IJ SOLN
10.0000 mg | Freq: Once | INTRAMUSCULAR | Status: AC
  Administered 2021-11-11: 10 mg via INTRAVENOUS
  Filled 2021-11-11: qty 2

## 2021-11-11 MED ORDER — BISACODYL 10 MG RE SUPP: 10.0000 mg | Freq: Every day | RECTAL | Status: DC | PRN

## 2021-11-11 MED ORDER — GABAPENTIN 100 MG PO CAPS: 100.0000 mg | ORAL_CAPSULE | Freq: Three times a day (TID) | ORAL | Status: DC

## 2021-11-11 MED ORDER — DIVALPROEX SODIUM 250 MG PO DR TAB
500.0000 mg | DELAYED_RELEASE_TABLET | Freq: Two times a day (BID) | ORAL | Status: DC
  Administered 2021-11-11 – 2021-11-12 (×3): 500 mg via ORAL
  Filled 2021-11-11 (×3): qty 2

## 2021-11-11 MED ORDER — OXYCODONE HCL 5 MG PO TABS
5.0000 mg | ORAL_TABLET | ORAL | Status: DC | PRN
  Administered 2021-11-11: 10 mg via ORAL
  Administered 2021-11-11: 5 mg via ORAL
  Administered 2021-11-12 – 2021-11-18 (×5): 10 mg via ORAL
  Administered 2021-11-19 – 2021-11-20 (×2): 5 mg via ORAL
  Administered 2021-11-20 (×2): 10 mg via ORAL
  Filled 2021-11-11: qty 1
  Filled 2021-11-11 (×4): qty 2
  Filled 2021-11-11: qty 1
  Filled 2021-11-11 (×3): qty 2
  Filled 2021-11-11: qty 1
  Filled 2021-11-11: qty 2

## 2021-11-11 NOTE — Progress Notes (Signed)
Subjective:  had HD late yesterday-  said had to sign off early due to headache and nausea-  still has to a certain extent this AM-  it is his understanding he will go home tomorrow    Objective Vital signs in last 24 hours: Vitals:   11/10/21 2033 11/10/21 2110 11/11/21 0017 11/11/21 0523  BP:  (!) 153/62 (!) 166/68 (!) 169/72  Pulse:  84 70 66  Resp:  '19 19 18  '$ Temp:  98.2 F (36.8 C) 97.6 F (36.4 C) 98.2 F (36.8 C)  TempSrc:  Oral Axillary Axillary  SpO2:  99% 99% 97%  Weight: 93.4 kg     Height:       Weight change:   Intake/Output Summary (Last 24 hours) at 11/11/2021 0726 Last data filed at 11/11/2021 0600 Gross per 24 hour  Intake 500 ml  Output 1800 ml  Net -1300 ml    Dialysis Orders:  [facility] MWF  4 hrs 15 min, BFR 450/AF 1.5  EDW 89.9, 2 K/ 2 Ca, UFP 4   Access: LU AVF  Hep 2500   STOP Heparin (has spontaneous subdural hematoma) Sensipar 60 mg qHD Calcitriol 1.25 mcg PO qHD   Assessment/Plan: New onset seizure -Possibly 2/2 Right SDH per neurology. Neurology following. Started on Keppra with renal dosing. Seizure/fall precautions.  Subdural hematoma - Repeat head CT 10/18 showed sustained SDH. Neurosurgery recommends conservative management.    ESRD -  On dialysis MWF. HD 10/18 stopped early due to nausea, also on 10/20. Marland Kitchen Plan for HD next on Monday  on schedule. Supplemental Keppra 250 mg after HD per neurology. No Heparin. Hypokalemia - K 3.3 pre HD, increased K bath used.   Hypertension/volume  - Does not appear fluid overloaded, although crackles present on exam.  Plan for UF as tolerated, usually removes around 3L with HD. BP elevated to ~189/80. Continue amlodipine, Imdur, metoprolol.    Anemia of CKD - Hgb 10.1, stable. No indication for ESA. -  now just below 10-  will watch   Secondary Hyperparathyroidism -  Ca 10.2, continue Sensipar 60 mg qHD and calcitriol. Sevelamer with meals.  Nutrition -  Renal diet with fluid restriction   OSA on CPAP - CPAP at  night Chronic thrombocytopenia  - stable    Louis Meckel    Labs: Basic Metabolic Panel: Recent Labs  Lab 11/07/21 2144 11/07/21 2150 11/08/21 0415 11/09/21 1231 11/10/21 1246  NA 138 136 138  --  134*  K 3.5 3.5 3.3* 4.2 4.5  CL 97* 95* 95*  --  90*  CO2 26  --  25  --  27  GLUCOSE 149* 145* 117*  --  120*  BUN 40* 36* 41*  --  56*  CREATININE 11.66* 11.80* 12.02*  --  12.63*  CALCIUM 9.3  --  10.2  --  9.9  PHOS  --   --   --   --  6.4*   Liver Function Tests: Recent Labs  Lab 11/07/21 2144 11/10/21 1246  AST 11*  --   ALT 11  --   ALKPHOS 55  --   BILITOT 0.7  --   PROT 6.5  --   ALBUMIN 3.2* 3.2*   No results for input(s): "LIPASE", "AMYLASE" in the last 168 hours. No results for input(s): "AMMONIA" in the last 168 hours. CBC: Recent Labs  Lab 11/07/21 2144 11/07/21 2150 11/08/21 0415 11/10/21 1246  WBC 4.6  --  4.5 3.9*  NEUTROABS  3.6  --   --   --   HGB 10.3* 11.2* 10.1* 9.7*  HCT 30.0* 33.0* 30.9* 28.1*  MCV 92.3  --  94.5 91.8  PLT 87*  --  90* 79*   Cardiac Enzymes: No results for input(s): "CKTOTAL", "CKMB", "CKMBINDEX", "TROPONINI" in the last 168 hours. CBG: Recent Labs  Lab 11/08/21 1832  GLUCAP 143*    Iron Studies: No results for input(s): "IRON", "TIBC", "TRANSFERRIN", "FERRITIN" in the last 72 hours. Studies/Results: EEG adult  Result Date: 11/09/2021 [name], MD     11/09/2021  3:18 PM Patient Name: Kirk [name] MRN: [id number] Epilepsy Attending: [name] Referring Physician/Provider: August Albino, NP Date:  11/09/2021 Duration: 23.38 mins Patient history: 61yo M with new onset seizures. EEG to evaluate for seizure. Level of alertness: Awake, asleep AEDs during EEG study: LEV Technical aspects: This EEG study was done with scalp electrodes positioned according to the 10-20 International system of electrode placement. Electrical activity was reviewed with band pass filter of 1-'70Hz'$ , sensitivity of 7  uV/mm, display speed of 46m/sec with a '60Hz'$  notched filter applied as appropriate. EEG data were recorded continuously and digitally stored.  Video monitoring was available and reviewed as appropriate. Description: The posterior dominant rhythm consists of 8-'9Hz'$  activity of moderate voltage (25-35 uV) seen predominantly in posterior head regions, symmetric and reactive to eye opening and eye closing. Sleep was characterized by vertex waves, sleep spindles (12 to 14 Hz), maximal frontocentral region.  EEG showed intermittent 3 to 6 Hz theta-delta slowing in right temporal region. Hyperventilation and photic stimulation were not performed.   ABNORMALITY - Intermittent slow, right temporal region IMPRESSION: This study is suggestive of cortical dysfunction arising from right temporal region likely secondary to underlying SDH. No seizures or epileptiform discharges were seen throughout the recording. Please note lack of epileptiform activity does not exclude the diagnosis of epilepsy. [name]  Medications: Infusions:    Scheduled Medications:  amLODipine  10 mg Oral Daily   calcitRIOL  1 mcg Oral Q M,W,F-HD   Chlorhexidine Gluconate Cloth  6 each Topical Q0600   cholecalciferol  4,000 Units Oral Daily   doxazosin  4 mg Oral Daily   guaiFENesin  1,200 mg Oral BID   isosorbide mononitrate  30 mg Oral Daily   labetalol  10 mg Intravenous Once   levETIRAcetam  250 mg Oral Q M,W,F-HD   levETIRAcetam  500 mg Oral Q0600   methocarbamol  750 mg Oral QID   metoprolol tartrate  100 mg Oral BID   multivitamin  1 tablet Oral Daily   pantoprazole  40 mg Oral BID AC   pravastatin  40 mg Oral Daily   sevelamer carbonate  3,200 mg Oral TID WC    have reviewed scheduled and prn medications.  Physical Exam: General:  lying in bed-  many emesis bags-  on CPAP-  denies complaint Heart: RRR Lungs: mostly clear Abdomen: soft, non tender Extremities: no edema Dialysis Access: L AVF- patent      11/11/2021,7:26 AM  LOS: 3 days

## 2021-11-11 NOTE — Progress Notes (Signed)
PROGRESS NOTE    Kirk [name]  WUJ:[id number] DOB: 01-10-1961 DOA: 11/07/2021 PCP: Sandrea Hughs, NP   Brief Narrative:  61 year old male with history of end-stage renal disease on hemodialysis, Alport syndrome, hypertension, chronic thrombocytopenia, anemia of chronic disease, chronic headaches, hyperlipidemia, OSA on CPAP, pancytopenia, diabetes mellitus type 2, memory impairment, chronic diastolic CHF, chronic bilateral upper extremity and lower extremity weakness since C-spine surgery in February 2023 presented with new onset seizure at home lasting for 2 minutes.  On presentation, CT of the head showed subdural hematoma.  Neurology and neurosurgery were consulted.  He was started on Keppra.  Assessment & Plan:  New onset seizures -Possibly due to right subdural hematoma as per neurology.  Neurology following.  Has been started on Keppra, renally dosed. -Seizure/fall precautions. -Neurology signed off on 03/12/2021 and recommended to continue current dose of Keppra with outpatient follow-up with neurology.  Subdural hematoma -Repeat CT of the head still showed some subdural hematoma.  Neurosurgery recommended conservative management. -Patient still complaining of nagging headache.  We will repeat CT of the head.  End-stage renal disease on hemodialysis -Nephrology following.  Dialysis as per nephrology schedule.  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  Continue amlodipine, Imdur, metoprolol  OSA on CPAP -Continue CPAP at night  Anemia of chronic disease -From renal failure.  Hemoglobin stable.  Monitor intermittently  Chronic thrombocytopenia -Platelets stable.  Monitor intermittently  Hypokalemia -Resolved.  No labs today.  GERD -Continue Protonix  Hyperlipidemia -Continue statin  Diabetes mellitus type 2--blood sugars stable  Obesity -Outpatient  follow-up  Chronic diastolic CHF -Volume managed by dialysis.  Strict input and output.  Daily weights.  Fluid restriction.  Physical deconditioning -PT recommends outpatient PT.  DVT prophylaxis: SCDs Code Status: Full Family Communication: Spoke to wife on phone on 11/10/2021 Disposition Plan: Status is: Inpatient Remains inpatient appropriate because: Of severity of illness  Consultants: Neurology/nephrology/neurosurgery  Procedures: None  Antimicrobials: None   Subjective: Patient seen and examined at bedside.  Poor historian.  Still complains of intermittent headache and nausea.  No fever, chest pain, worsening shortness of breath reported.  Objective: Vitals:   11/10/21 2110 11/11/21 0017 11/11/21 0523 11/11/21 0730  BP: (!) 153/62 (!) 166/68 (!) 169/72 (!) 167/87  Pulse: 84 70 66 72  Resp: '19 19 18 17  '$ Temp: 98.2 F (36.8 C) 97.6 F (36.4 C) 98.2 F (36.8 C) 98.1 F (36.7 C)  TempSrc: Oral Axillary Axillary Oral  SpO2: 99% 99% 97% 98%  Weight:      Height:        Intake/Output Summary (Last 24 hours) at 11/11/2021 0745 Last data filed at 11/11/2021 0600 Gross per 24 hour  Intake 500 ml  Output 1800 ml  Net -1300 ml    Filed Weights   11/07/21 2053 11/10/21 2030 11/10/21 2033  Weight: 90.7 kg 91.6 kg 93.4 kg    Examination:  General: No acute distress.  Still on room air.  Looks chronically ill and deconditioned. ENT/neck: No JVD elevation or palpable neck masses noted  respiratory: Bilateral decreased breath sounds at bases with scattered crackles  CVS: Mostly rate controlled; S1-S2 heard Abdominal: Soft, obese, nontender, tender mildly; no organomegaly, bowel sounds are heard normally Extremities: No clubbing; lower extremity edema present bilaterally  CNS: Alert; and more responsive today; answers questions appropriately.  No focal neurologic deficit.  Able to move extremities Lymph: No cervical lymphadenopathy noted  skin: No obvious  petechiae/rashes psych: Currently not agitated.  Affect is mostly flat. Musculoskeletal: No obvious joint swelling/deformity    Data Reviewed: I have personally reviewed following labs and imaging studies  CBC: Recent Labs  Lab 11/07/21 2144 11/07/21 2150 11/08/21 0415 11/10/21 1246  WBC 4.6  --  4.5 3.9*  NEUTROABS 3.6  --   --   --   HGB 10.3* 11.2* 10.1* 9.7*  HCT 30.0* 33.0* 30.9* 28.1*  MCV 92.3  --  94.5 91.8  PLT 87*  --  90* 79*    Basic Metabolic Panel: Recent Labs  Lab 11/07/21 2144 11/07/21 2150 11/08/21 0415 12-07-2021 1231 11/10/21 1246  NA 138 136 138  --  134*  K 3.5 3.5 3.3* 4.2 4.5  CL 97* 95* 95*  --  90*  CO2 26  --  25  --  27  GLUCOSE 149* 145* 117*  --  120*  BUN 40* 36* 41*  --  56*  CREATININE 11.66* 11.80* 12.02*  --  12.63*  CALCIUM 9.3  --  10.2  --  9.9  PHOS  --   --   --   --  6.4*    GFR: Estimated Creatinine Clearance: 6.9 mL/min (A) (by C-G formula based on SCr of 12.63 mg/dL (H)). Liver Function Tests: Recent Labs  Lab 11/07/21 2144 11/10/21 1246  AST 11*  --   ALT 11  --   ALKPHOS 55  --   BILITOT 0.7  --   PROT 6.5  --   ALBUMIN 3.2* 3.2*    No results for input(s): "LIPASE", "AMYLASE" in the last 168 hours. No results for input(s): "AMMONIA" in the last 168 hours. Coagulation Profile: No results for input(s): "INR", "PROTIME" in the last 168 hours. Cardiac Enzymes: No results for input(s): "CKTOTAL", "CKMB", "CKMBINDEX", "TROPONINI" in the last 168 hours. BNP (last 3 results) No results for input(s): "PROBNP" in the last 8760 hours. HbA1C: No results for input(s): "HGBA1C" in the last 72 hours. CBG: Recent Labs  Lab 11/08/21 1832  GLUCAP 143*    Lipid Profile: No results for input(s): "CHOL", "HDL", "LDLCALC", "TRIG", "CHOLHDL", "LDLDIRECT" in the last 72 hours. Thyroid Function Tests: No results for input(s): "TSH", "T4TOTAL", "FREET4", "T3FREE", "THYROIDAB" in the last 72 hours. Anemia Panel: No  results for input(s): "VITAMINB12", "FOLATE", "FERRITIN", "TIBC", "IRON", "RETICCTPCT" in the last 72 hours. Sepsis Labs: No results for input(s): "PROCALCITON", "LATICACIDVEN" in the last 168 hours.  No results found for this or any previous visit (from the past 240 hour(s)).       Radiology Studies: EEG adult  Result Date: December 07, 2021 [name], MD     Dec 07, 2021  3:18 PM Patient Name: Kirk [name] MRN: [id number] Epilepsy Attending: [name] Referring Physician/Provider: August Albino, NP Date:  12-07-21 Duration: 23.38 mins Patient history: 61yo M with new onset seizures. EEG to evaluate for seizure. Level of alertness: Awake, asleep AEDs during EEG study: LEV Technical aspects: This EEG study was done with scalp electrodes positioned according to the 10-20 International system of electrode placement. Electrical activity was reviewed with band pass filter of 1-'70Hz'$ , sensitivity of 7 uV/mm, display speed of 75m/sec with a '60Hz'$  notched filter applied as appropriate. EEG data were recorded continuously and digitally stored.  Video monitoring was available and reviewed as appropriate. Description: The posterior dominant rhythm consists of 8-'9Hz'$  activity of moderate voltage (25-35 uV) seen predominantly in posterior head regions, symmetric and reactive to eye  opening and eye closing. Sleep was characterized by vertex waves, sleep spindles (12 to 14 Hz), maximal frontocentral region.  EEG showed intermittent 3 to 6 Hz theta-delta slowing in right temporal region. Hyperventilation and photic stimulation were not performed.   ABNORMALITY - Intermittent slow, right temporal region IMPRESSION: This study is suggestive of cortical dysfunction arising from right temporal region likely secondary to underlying SDH. No seizures or epileptiform discharges were seen throughout the recording. Please note lack of epileptiform activity does not exclude the diagnosis of epilepsy. Priyanka Barbra Sarks         Scheduled Meds:  amLODipine  10 mg Oral Daily   calcitRIOL  1 mcg Oral Q M,W,F-HD   Chlorhexidine Gluconate Cloth  6 each Topical Q0600   cholecalciferol  4,000 Units Oral Daily   doxazosin  4 mg Oral Daily   guaiFENesin  1,200 mg Oral BID   isosorbide mononitrate  30 mg Oral Daily   labetalol  10 mg Intravenous Once   levETIRAcetam  250 mg Oral Q M,W,F-HD   levETIRAcetam  500 mg Oral Q0600   methocarbamol  750 mg Oral QID   metoprolol tartrate  100 mg Oral BID   multivitamin  1 tablet Oral Daily   pantoprazole  40 mg Oral BID AC   pravastatin  40 mg Oral Daily   sevelamer carbonate  3,200 mg Oral TID WC   Continuous Infusions:          Aline August, MD [facility] 11/11/2021, 7:45 AM

## 2021-11-11 NOTE — Progress Notes (Signed)
Patient c/o bad headache; patient is alert and sitting up at the bedside; medicated prn pain; continue to monitor.

## 2021-11-11 NOTE — Progress Notes (Signed)
Physical Therapy Treatment Patient Details Name: Kirk [name] MRN: [id number] DOB: November 19, 1960 Today's Date: 11/11/2021   History of Present Illness Pt is a 61 y.o. male who presented to the ED 10/17 with seizure-like activity. CT revealed R frontal SDH. Near-syncopal-type episode during therapy 10/21, orthostatics stable. PMH:  ESRD on HD, anemia of CKD, carpal tunnel syndrome, chronic headaches, HTN, gout, chronic bilateral upper and lower extremity weakness due to cervical spine pathology (c spine sx 06/2012 and 02/2021), prior renal transplant, OSA on CPAP and secondary hyperparathyroidism    PT Comments    Pt received in supine with CPAP donned, easily awoken but somewhat alternating between alert and lethargic during session. Pt agreeable to therapy session, reporting headache and per chart review he did have muscle relaxer around 2pm but otherwise no pain meds since noon. Pt needing min guard minA initially for gait trial to PT gym. During stair trial, pt with increased instability and c/o headache of increased severity, pt needing up to modA and seated break (BP stable when checked seated/standing). RN called to assist with pivotal transfer to wheelchair with +2 modA and increased instability despite RW and pt transported back to room via wheelchair for safety. Pt modA for second pivot back to bed with RW/HHA. Pt impulsive to abandon RW. Pt not yet safe to DC home with OPPT due to increased instability, will plan to reassess disposition following date pending progress, MD notified of pt symptoms. Pt continues to benefit from PT services to progress toward functional mobility goals.   Recommendations for follow up therapy are one component of a multi-disciplinary discharge planning process, led by the attending physician.  Recommendations may be updated based on patient status, additional functional criteria and insurance authorization.  Follow Up Recommendations  Other (comment) (pending  progress next session; not yet safe for DC home given instability today)     Assistance Recommended at Discharge Frequent or constant Supervision/Assistance  Patient can return home with the following A little help with walking and/or transfers;A little help with bathing/dressing/bathroom;Assistance with cooking/housework;Assist for transportation;Help with stairs or ramp for entrance   Equipment Recommendations  Rolling walker (2 wheels);BSC/3in1    Recommendations for Other Services Rehab consult     Precautions / Restrictions Precautions Precautions: Fall Precaution Comments: syncopal type symptoms 10/21, BP and VS stable Restrictions Weight Bearing Restrictions: No     Mobility  Bed Mobility Overal bed mobility: Modified Independent             General bed mobility comments: pt impulsive to perform very rapidly    Transfers Overall transfer level: Needs assistance Equipment used: Rolling walker (2 wheels) Transfers: Sit to/from Stand, Bed to chair/wheelchair/BSC Sit to Stand: Min assist, Mod assist   Step pivot transfers: +2 safety/equipment, Mod assist       General transfer comment: increased time to stabilize balance, impulsive; increased to modA for step pivot transfer from wheelchair to EOB toward end of session with pt instability and near-syncopal type symptoms, pt abandoning RW and provided with HHA for safety    Ambulation/Gait Ambulation/Gait assistance: Mod assist, Min assist Gait Distance (Feet): 150 Feet (174f, 577fx2 with seated breaks between) Assistive device: Rolling walker (2 wheels) Gait Pattern/deviations: Step-through pattern, Decreased stride length, Staggering left, Staggering right, Leaning posteriorly Gait velocity: fast, unsafe     General Gait Details: Unsafe gait. Attempting to walk with his eyes closed due to headache and bright lights in hallway. Initially min guard to minA for safety and stability  but with pt increased  instability on stairs, pt needing up to modA static standing after seated rest break and to pivot to wheelchair with RW and no AD. RN called to assist for pt safety.   Stairs Stairs: Yes Stairs assistance: Min guard, Mod assist Stair Management: One rail Left, Two rails, Alternating pattern, Step to pattern, Forwards Number of Stairs: 7 General stair comments: pt ascended/descended 5 steps in PT gym with min guard and step sequencing/safety cues with step-to pattern descending. Pt then instructed to perform with single rail and step-to pattern and pt with near-buckling posterior LOB and c/o increased headache, pt catching feet on each 2 steps descending needing modA for stability/safety despite handrail. RN called to room to assist with further mobility after pt assisted to sit in chair next to steps. MD also notified.   Wheelchair Mobility    Modified Rankin (Stroke Patients Only) Modified Rankin (Stroke Patients Only) Pre-Morbid Rankin Score: Slight disability Modified Rankin: Moderately severe disability     Balance Overall balance assessment: Needs assistance Sitting-balance support: No upper extremity supported, Feet supported Sitting balance-Leahy Scale: Good     Standing balance support: No upper extremity supported, Bilateral upper extremity supported, During functional activity Standing balance-Leahy Scale: Poor Standing balance comment: impulsive needing up to modA without RW and min to modA with RW due to symptoms of headache and fatigue suddenly.                            Cognition Arousal/Alertness: Awake/alert Behavior During Therapy: Impulsive, Flat affect Overall Cognitive Status: No family/caregiver present to determine baseline cognitive functioning Area of Impairment: Attention, Following commands, Safety/judgement, Problem solving                   Current Attention Level: Selective   Following Commands: Follows one step commands  consistently, Follows multi-step commands inconsistently Safety/Judgement: Decreased awareness of safety, Decreased awareness of deficits   Problem Solving: Difficulty sequencing, Requires verbal cues General Comments: Pt with gradually increasing symptoms of lightheadedness and headache during session, RN called to assist when pt assisted to take seated break in gym with posterior LOB/instability and difficulty keeping eyes open/focusing on objects in room during BP assessment. MD also notified as per RN this was a change from previous day and also different than previous PT session.        Exercises      General Comments General comments (skin integrity, edema, etc.): HR 60's bpm and BP 162/60 seated in chair and BP 153/66 standing at RW      Pertinent Vitals/Pain Pain Assessment Pain Assessment: 0-10 Pain Score: 10-Worst pain ever Pain Location: headache Pain Descriptors / Indicators: Headache, Guarding, Grimacing Pain Intervention(s): Limited activity within patient's tolerance, Monitored during session, Repositioned, Premedicated before session     PT Goals (current goals can now be found in the care plan section) Acute Rehab PT Goals Patient Stated Goal: to feel better and less HA prior to going home PT Goal Formulation: With patient Time For Goal Achievement: 11/24/21 Progress towards PT goals: PT to reassess next treatment (increased instability/functional decline today)    Frequency    Min 4X/week      PT Plan Discharge plan needs to be updated       AM-PAC PT "6 Clicks" Mobility   Outcome Measure  Help needed turning from your back to your side while in a flat bed without using bedrails?: None Help needed  moving from lying on your back to sitting on the side of a flat bed without using bedrails?: None Help needed moving to and from a bed to a chair (including a wheelchair)?: A Lot Help needed standing up from a chair using your arms (e.g., wheelchair or  bedside chair)?: A Lot Help needed to walk in hospital room?: A Lot Help needed climbing 3-5 steps with a railing? : A Lot 6 Click Score: 16    End of Session Equipment Utilized During Treatment: Gait belt Activity Tolerance: Treatment limited secondary to medical complications (Comment);Other (comment);Patient limited by pain (near-syncopal type episode but BP stable, MD notified) Patient left: in bed;with call bell/phone within reach;with bed alarm set;Other (comment) (pt self-donned CPAP and sidelying in bed) Nurse Communication: Mobility status;Patient requests pain meds;Other (comment);Precautions (severe HA and increased instability) PT Visit Diagnosis: Unsteadiness on feet (R26.81);Muscle weakness (generalized) (M62.81);Pain Pain - part of body:  (headache)     Time: 1741-1800 PT Time Calculation (min) (ACUTE ONLY): 19 min  Charges:  $Gait Training: 8-22 mins                     Kamya Watling P., PTA Acute Rehabilitation Services Secure Chat Preferred 9a-5:30pm Office: [place] 11/11/2021, 6:29 PM

## 2021-11-12 DIAGNOSIS — N186 End stage renal disease: Secondary | ICD-10-CM | POA: Diagnosis not present

## 2021-11-12 DIAGNOSIS — G43811 Other migraine, intractable, with status migrainosus: Secondary | ICD-10-CM | POA: Diagnosis not present

## 2021-11-12 DIAGNOSIS — S065XAA Traumatic subdural hemorrhage with loss of consciousness status unknown, initial encounter: Secondary | ICD-10-CM | POA: Diagnosis not present

## 2021-11-12 DIAGNOSIS — R569 Unspecified convulsions: Secondary | ICD-10-CM | POA: Diagnosis not present

## 2021-11-12 DIAGNOSIS — Q8781 Alport syndrome: Secondary | ICD-10-CM | POA: Diagnosis not present

## 2021-11-12 LAB — VALPROIC ACID LEVEL: Valproic Acid Lvl: 15 ug/mL — ABNORMAL LOW (ref 50.0–100.0)

## 2021-11-12 MED ORDER — CHLORHEXIDINE GLUCONATE CLOTH 2 % EX PADS
6.0000 | MEDICATED_PAD | Freq: Every day | CUTANEOUS | Status: DC
  Administered 2021-11-12 – 2021-11-19 (×8): 6 via TOPICAL

## 2021-11-12 MED ORDER — PROCHLORPERAZINE EDISYLATE 10 MG/2ML IJ SOLN
10.0000 mg | Freq: Once | INTRAMUSCULAR | Status: AC
  Administered 2021-11-12: 10 mg via INTRAVENOUS
  Filled 2021-11-12: qty 2

## 2021-11-12 NOTE — Progress Notes (Signed)
Subjective:  still has headache but nausea is better-  eating breakfast-  had CT-  possible slight increase in SDH-  not sure what the dispo plan is    Objective Vital signs in last 24 hours: Vitals:   11/11/21 2351 11/12/21 0016 11/12/21 0359 11/12/21 0724  BP: (!) 185/70 (!) 155/65 (!) 164/65 (!) 162/66  Pulse: 63 67 65 69  Resp: '20  19 19  '$ Temp: 98.2 F (36.8 C)  98 F (36.7 C) 98.1 F (36.7 C)  TempSrc: Oral  Oral Oral  SpO2: 98%  97% 98%  Weight:      Height:       Weight change:  No intake or output data in the 24 hours ending 11/12/21 0733   Dialysis Orders:  [facility] MWF  4 hrs 15 min, BFR 450/AF 1.5  EDW 89.9, 2 K/ 2 Ca, UFP 4   Access: LU AVF  Hep 2500   STOP Heparin (has spontaneous subdural hematoma) Sensipar 60 mg qHD Calcitriol 1.25 mcg PO qHD   Assessment/Plan: New onset seizure -Possibly 2/2 Right SDH per neurology. Neurology following. Started on Keppra with renal dosing. Seizure/fall precautions.  Subdural hematoma - Repeat head CT 10/21 showed maybe increased  SDH. Neurosurgery was recommending conservative management. -  not sure if plan has changed   ESRD -  On dialysis MWF. HD 10/18 stopped early due to nausea, also on 10/20. Marland Kitchen Plan for HD next on Monday  on schedule- either here or at OP center depending on plan. Supplemental Keppra 250 mg after HD per neurology. No Heparin. Hypokalemia - K 3.3 pre HD, increased K bath used prior.  Pre labs tomorrow .   Hypertension/volume  - Does not appear fluid overloaded, although crackles present on exam.  Plan for UF as tolerated, usually removes around 3L with HD.  Continue amlodipine, Imdur, metoprolol.    Anemia of CKD - Hgb 10.1, stable. No indication for ESA. -  now just below 10-  will watch   Secondary Hyperparathyroidism -  Ca 10.2, continue Sensipar 60 mg qHD and calcitriol. Sevelamer with meals.  Nutrition -  Renal diet with fluid restriction   OSA on CPAP - CPAP at night Chronic thrombocytopenia  -  stable    Louis Meckel    Labs: Basic Metabolic Panel: Recent Labs  Lab 11/07/21 2144 11/07/21 2150 11/08/21 0415 11/09/21 1231 11/10/21 1246  NA 138 136 138  --  134*  K 3.5 3.5 3.3* 4.2 4.5  CL 97* 95* 95*  --  90*  CO2 26  --  25  --  27  GLUCOSE 149* 145* 117*  --  120*  BUN 40* 36* 41*  --  56*  CREATININE 11.66* 11.80* 12.02*  --  12.63*  CALCIUM 9.3  --  10.2  --  9.9  PHOS  --   --   --   --  6.4*   Liver Function Tests: Recent Labs  Lab 11/07/21 2144 11/10/21 1246  AST 11*  --   ALT 11  --   ALKPHOS 55  --   BILITOT 0.7  --   PROT 6.5  --   ALBUMIN 3.2* 3.2*   No results for input(s): "LIPASE", "AMYLASE" in the last 168 hours. No results for input(s): "AMMONIA" in the last 168 hours. CBC: Recent Labs  Lab 11/07/21 2144 11/07/21 2150 11/08/21 0415 11/10/21 1246  WBC 4.6  --  4.5 3.9*  NEUTROABS 3.6  --   --   --  HGB 10.3* 11.2* 10.1* 9.7*  HCT 30.0* 33.0* 30.9* 28.1*  MCV 92.3  --  94.5 91.8  PLT 87*  --  90* 79*   Cardiac Enzymes: No results for input(s): "CKTOTAL", "CKMB", "CKMBINDEX", "TROPONINI" in the last 168 hours. CBG: Recent Labs  Lab 11/08/21 1832  GLUCAP 143*    Iron Studies: No results for input(s): "IRON", "TIBC", "TRANSFERRIN", "FERRITIN" in the last 72 hours. Studies/Results: CT HEAD WO CONTRAST (5MM)  Result Date: 11/11/2021 CLINICAL DATA:  Follow-up subdural hematoma EXAM: CT HEAD WITHOUT CONTRAST TECHNIQUE: Contiguous axial images were obtained from the base of the skull through the vertex without intravenous contrast. RADIATION DOSE REDUCTION: This exam was performed according to the departmental dose-optimization program which includes automated exposure control, adjustment of the mA and/or kV according to patient size and/or use of iterative reconstruction technique. COMPARISON:  Head CT from 3 days ago FINDINGS: Brain: Mixed density subdural hematoma along the right cerebral convexity, maximal thickness along  the anterior right frontal convexity measuring up to 8 mm. Local cortical mass effect is mild. High-density subdural clot along the right tentorial leaflet is new, possibly redistribution. No hydrocephalus, infarct, or masslike finding Vascular: Arterial calcifications correlating with end-stage renal disease Skull: Normal. Negative for fracture or focal lesion. Sinuses/Orbits: Negative IMPRESSION: Trace increase in subdural hemorrhage along the right tentorium, but unchanged maximal thickness along the right frontal convexity at 8 mm. Electronically Signed   By: Jorje Guild M.D.   On: 11/11/2021 11:44   Medications: Infusions:    Scheduled Medications:  amLODipine  10 mg Oral Daily   calcitRIOL  1 mcg Oral Q M,W,F-HD   Chlorhexidine Gluconate Cloth  6 each Topical Q0600   cholecalciferol  4,000 Units Oral Daily   divalproex  500 mg Oral Q12H   doxazosin  4 mg Oral Daily   guaiFENesin  1,200 mg Oral BID   isosorbide mononitrate  30 mg Oral Daily   labetalol  10 mg Intravenous Once   methocarbamol  750 mg Oral QID   metoprolol tartrate  100 mg Oral BID   multivitamin  1 tablet Oral Daily   pantoprazole  40 mg Oral BID AC   pravastatin  40 mg Oral Daily   senna-docusate  1 tablet Oral BID   sevelamer carbonate  3,200 mg Oral TID WC    have reviewed scheduled and prn medications.  Physical Exam: General:  sitting up eating breakfast but has headache  Heart: RRR Lungs: mostly clear Abdomen: soft, non tender Extremities: no edema Dialysis Access: L AVF- patent     11/12/2021,7:33 AM  LOS: 4 days

## 2021-11-12 NOTE — Progress Notes (Signed)
S: Patient complaining of an ongoing 10/10 throbbing headache localized to the top of his head which has been associated with nausea and vomiting today. Also with some photophobia. He states that he has a remote history of migraines. Grabs top of head frequently during the interview.   O: BP (!) 179/63   Pulse 63   Temp 97.8 F (36.6 C) (Oral)   Resp 20   Ht '5\' 7"'$  (1.702 m)   Wt 93.4 kg   SpO2 97%   BMI 32.25 kg/m   HEENT: [place]/AT Lungs: Respirations unlabored  Neuro: Ment: Awake and alert with dysthymic and pained affect. Speech is fluent with intact comprehension.  CN: Eyes are conjugate, but tends to keep them closed. Face symmetric. Phonation intact.  Motor: No asymmetry noted Cerebellar: No gross ataxia noted.   Assessment: 61 year old dialysis patient with new onset seizure and headaches in the context of acute right frontal convexity subdural hematoma.  - Exam improved relative to my initial exam on 10/18.  - Symptomatically, he is currently with a 10/10 headache - CT head:  Acute subdural hematoma overlying the right frontal convexity measuring 9 mm in greatest radial diameter. Mild adjacent mass effect and 1-2 mm of leftward midline shift at the septum pellucidum. - New onset seizure is most likely secondary to the right frontal subdural hematoma.  - Remote history of migraines. Current headache symptoms are most consistent with recrudescence of a severe migraine in the context of his acute subdural hematoma.    Recommendations:  - Continue Valproic acid at 500 mg PO BID - Valproic acid level has been ordered. May be able to titrate upwards if level is low.  - Compazine 10 mg IV x 1 - Monitor for possible improvement after Compazine.  - Educated family on migraine prevention and conservative measures for management including daily massage. There is a relatively high likelihood that his headaches will subside in frequency and intensity with reabsorption of the subdural  hematoma.   Electronically signed: Dr. Kerney Elbe

## 2021-11-12 NOTE — Progress Notes (Signed)
  NEUROSURGERY PROGRESS NOTE   No issues overnight. Pt cont to c/o HA. Repeat CTH obtained yesterday.  EXAM:  BP (!) 162/66 (BP Location: Right Arm)   Pulse 69   Temp 98.1 F (36.7 C) (Oral)   Resp 19   Ht '5\' 7"'$  (1.702 m)   Wt 93.4 kg   SpO2 98%   BMI 32.25 kg/m   Drowsy but easily arouses Speech fluent CN intact Mild symmetric weakness UE/LE  IMAGING: CTH personally reviewed and demonstrates essentially stable small right frontal convexity SDH without significant local mass effect. Minor tentorial SDH also largely stable.  IMPRESSION:  61 y.o. male with small subacute frontal convexity SDH, appears to be at neurologic baseline. Cont HA is to be expected.  PLAN: - Cont current supportive care - Cont depakote - Can f/u in outpatient NS clinic   Consuella Lose, MD [facility]

## 2021-11-12 NOTE — Evaluation (Signed)
Occupational Therapy Evaluation Patient Details Name: Kirk [name] MRN: [id number] DOB: 1960/10/01 Today's Date: 11/12/2021   History of Present Illness Pt is a 61 y.o. male who presented to the ED 10/17 with seizure-like activity. CT revealed R frontal SDH. Near-syncopal-type episode during therapy 10/21, orthostatics stable. PMH:  ESRD on HD, anemia of CKD, carpal tunnel syndrome, chronic headaches, HTN, gout, chronic bilateral upper and lower extremity weakness due to cervical spine pathology (c spine sx 06/2012 and 02/2021), prior renal transplant, OSA on CPAP and secondary hyperparathyroidism   Clinical Impression   This 61 yo male admitted with above presents to acute OT with PLOF of being Mod I with all basic ADLs and IADLs from a [facility] standpoint. Currently he is setup/S-min guard A for all basic ADLs at a RW level. He will continue to benefit from acute OT with follow up [place].      Recommendations for follow up therapy are one component of a multi-disciplinary discharge planning process, led by the attending physician.  Recommendations may be updated based on patient status, additional functional criteria and insurance authorization.   Follow Up Recommendations  Home health OT    Assistance Recommended at Discharge Intermittent Supervision/Assistance  Patient can return home with the following A little help with walking and/or transfers;A little help with bathing/dressing/bathroom;Assistance with cooking/housework;Assist for transportation;Help with stairs or ramp for entrance    Functional Status Assessment  Patient has had a recent decline in their functional status and demonstrates the ability to make significant improvements in function in a reasonable and predictable amount of time.  Equipment Recommendations  BSC/3in1       Precautions / Restrictions Precautions Precautions: Fall Precaution Comments: syncopal type symptoms 10/21 Restrictions Weight Bearing Restrictions:  No      Mobility Bed Mobility Overal bed mobility: Independent                  Transfers Overall transfer level: Needs assistance Equipment used: Rolling walker (2 wheels) Transfers: Sit to/from Stand Sit to Stand: Min guard                  Balance Overall balance assessment: Needs assistance Sitting-balance support: No upper extremity supported, Feet supported Sitting balance-Leahy Scale: Good     Standing balance support: During functional activity, Bilateral upper extremity supported Standing balance-Leahy Scale: Poor                             ADL either performed or assessed with clinical judgement   ADL Overall ADL's : Needs assistance/impaired Eating/Feeding: Independent;Sitting   Grooming: Set up;Sitting   Upper Body Bathing: Set up;Sitting   Lower Body Bathing: Min guard;Sit to/from stand   Upper Body Dressing : Set up;Sitting   Lower Body Dressing: Min guard;Sit to/from stand   Toilet Transfer: Min guard;Ambulation Toilet Transfer Details (indicate cue type and reason): simulated bed>door of room>back to bed Toileting- Clothing Manipulation and Hygiene: Min guard;Sit to/from stand               Vision Patient Visual Report: No change from baseline              Pertinent Vitals/Pain Pain Assessment Pain Assessment: 0-10 Pain Score: 8  Pain Location: headache worsens with mobility associated with nausea Pain Descriptors / Indicators: Headache, Guarding, Grimacing Pain Intervention(s): Limited activity within patient's tolerance, Monitored during session, Repositioned     Hand Dominance Right   Extremity/Trunk Assessment  Upper Extremity Assessment Upper Extremity Assessment: LUE deficits/detail LUE Deficits / Details: Decreased AROM at shoulder due to "bad shoulder" LUE Coordination: decreased gross motor           Communication Communication Communication: No difficulties   Cognition Arousal/Alertness:  Awake/alert Behavior During Therapy: WFL for tasks assessed/performed, Impulsive Overall Cognitive Status: No family/caregiver present to determine baseline cognitive functioning Area of Impairment: Safety/judgement                         Safety/Judgement: Decreased awareness of safety, Decreased awareness of deficits     General Comments: Pt reports when his headache worsens due to getting up and moving around, this increases his nausea                Home Living Family/patient expects to be discharged to:: Private residence Living Arrangements: Spouse/significant other;Children Available Help at Discharge: Family;Available 24 hours/day Type of Home: House Home Access: Stairs to enter [employer] of Steps: 3 Entrance Stairs-Rails: Right Home Layout: One level     Bathroom Shower/Tub: Teacher, early years/pre: Standard     Home Equipment: Cane - single point   Additional Comments: Pt reports his plumbing is bad and is bathroom is not functional. He states he goes to a nearby store for bathroom needs or used BSC and disposes of it somewhere. He no longer makes urine (on HD).      Prior Functioning/Environment Prior Level of Function : Independent/Modified Independent;Driving;History of Falls (last six months)             Mobility Comments: no AD at baseline. Frequent falls. ADLs Comments: sponge bathes        OT Problem List: Decreased activity tolerance;Impaired balance (sitting and/or standing);Pain;Decreased safety awareness      OT Treatment/Interventions: Self-care/ADL training;DME and/or AE instruction;Patient/family education;Balance training    OT Goals(Current goals can be found in the care plan section) Acute Rehab OT Goals Patient Stated Goal: to not have headache and not feel nauseated OT Goal Formulation: With patient Time For Goal Achievement: 11/26/21 Potential to Achieve Goals: Good  OT Frequency: Min 2X/week        AM-PAC OT "6 Clicks" Daily Activity     Outcome Measure Help from another person eating meals?: None Help from another person taking care of personal grooming?: A Little Help from another person toileting, which includes using toliet, bedpan, or urinal?: A Little Help from another person bathing (including washing, rinsing, drying)?: A Little Help from another person to put on and taking off regular upper body clothing?: A Little Help from another person to put on and taking off regular lower body clothing?: A Little 6 Click Score: 19   End of Session Equipment Utilized During Treatment: Gait belt;Rolling walker (2 wheels) Nurse Communication:  (pt requests nausea meds)  Activity Tolerance:  (limited by pain and nausea) Patient left: in bed;with call bell/phone within reach;with bed alarm set  OT Visit Diagnosis: Unsteadiness on feet (R26.81);Other abnormalities of gait and mobility (R26.89);Muscle weakness (generalized) (M62.81);Pain;Other symptoms and signs involving cognitive function Pain - part of body:  (headache)                Time: 7322-0254 OT Time Calculation (min): 21 min Charges:  OT General Charges $OT Visit: 1 Visit OT Evaluation $OT Eval Moderate Complexity: 1 Mod  Kirk [name], OTR/L Acute [facility] [phone] Office [phone]    Kirk [name] 11/12/2021,  5:48 PM

## 2021-11-12 NOTE — Progress Notes (Signed)
PROGRESS NOTE    Kirk [name]  PRF:[id number] DOB: 1960-06-29 DOA: 11/07/2021 PCP: Sandrea Hughs, NP   Brief Narrative:  61 year old male with history of end-stage renal disease on hemodialysis, Alport syndrome, hypertension, chronic thrombocytopenia, anemia of chronic disease, chronic headaches, hyperlipidemia, OSA on CPAP, pancytopenia, diabetes mellitus type 2, memory impairment, chronic diastolic CHF, chronic bilateral upper extremity and lower extremity weakness since C-spine surgery in February 2023 presented with new onset seizure at home lasting for 2 minutes.  On presentation, CT of the head showed subdural hematoma.  Neurology and neurosurgery were consulted.  He was started on Keppra.  Keppra was subsequently switched to Depakote on 11/11/2021 because of persistent headaches.  Assessment & Plan:  New onset seizures -Possibly due to right subdural hematoma as per neurology.  Neurology following.  Has been started on Keppra, renally dosed. -Seizure/fall precautions. -Neurology signed off on 11/09/2021 and recommended to continue current dose of Keppra with outpatient follow-up with neurology. -Keppra was switched to Depakote on 11/11/2021 after discussion with neurology/Dr. Leonel Ramsay via secure chat.  Subdural hematoma -Repeat CT of the head still showed some subdural hematoma.  Neurosurgery recommended conservative management. -Patient still complaining of severe headache.  Repeat CT of the head on 11/11/2021 had shown trace increase in subdural hemorrhage along the right tentorium.  I have asked neurosurgery/Dr. Kathyrn Sheriff to reevaluate the patient. -Patient requiring intermittent Compazine as well for headache and Keppra was switched to Depakote on 11/11/2021 because of severe headaches.  I have requested neurology/Dr. Leonel Ramsay to reevaluate the patient again today.  End-stage renal disease on hemodialysis -Nephrology following.  Dialysis as per nephrology  schedule.  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  Continue amlodipine, Imdur, metoprolol  OSA on CPAP -Continue CPAP at night  Anemia of chronic disease -From renal failure.  Hemoglobin stable.  Monitor intermittently  Chronic thrombocytopenia -Platelets stable.  Monitor intermittently  Hypokalemia -Resolved.  No labs today.  GERD -Continue Protonix  Hyperlipidemia -Continue statin  Diabetes mellitus type 2--blood sugars mostly stable.  If blood sugars trend up, will start CBGs with SSI.  Obesity -Outpatient follow-up  Chronic diastolic CHF -Volume managed by dialysis.  Strict input and output.  Daily weights.  Fluid restriction.  Physical deconditioning -PT recommends outpatient PT.  DVT prophylaxis: SCDs Code Status: Full Family Communication: Spoke to wife at bedside Disposition Plan: Status is: Inpatient Remains inpatient appropriate because: Of severity of illness  Consultants: Neurology/nephrology/neurosurgery  Procedures: None  Antimicrobials: None   Subjective: Patient seen and examined at bedside.  Poor historian.  Still complains of intermittent severe headache with intermittent nausea.  Does not feel ready to go home today.  No vomiting, chest pain, worsening shortness of breath reported. Objective: Vitals:   11/11/21 2351 11/12/21 0016 11/12/21 0359 11/12/21 0724  BP: (!) 185/70 (!) 155/65 (!) 164/65 (!) 162/66  Pulse: 63 67 65 69  Resp: '20  19 19  '$ Temp: 98.2 F (36.8 C)  98 F (36.7 C) 98.1 F (36.7 C)  TempSrc: Oral  Oral Oral  SpO2: 98%  97% 98%  Weight:      Height:       No intake or output data in the 24 hours ending 11/12/21 1037  Filed Weights   11/07/21 2053 11/10/21 2030 11/10/21 2033  Weight: 90.7 kg 91.6 kg 93.4 kg    Examination:  General: On room air with no distress.  Looks chronically ill and  deconditioned. ENT/neck: No palpable thyromegaly or obvious JVD elevation noted respiratory: Decreased breath sounds at bases bilaterally, scattered crackles  CVS: S1 and S2 are heard; rate controlled mostly  abdominal: Soft, obese, nontender, still has some mild distention; no organomegaly, normal bowel sounds are heard Extremities: Bilateral lower extremity edema present; no cyanosis  CNS: Alert, still slow to respond, answers questions appropriately.  No focal neurologic deficit.  Moving extremities  lymph: No obvious lymphadenopathy palpable skin: No obvious ecchymosis/lesions psych: Affect is still flat.  Not showing signs of agitation. Musculoskeletal: No obvious joint erythema/tenderness   Data Reviewed: I have personally reviewed following labs and imaging studies  CBC: Recent Labs  Lab 11/07/21 2144 11/07/21 2150 11/08/21 0415 11/10/21 1246  WBC 4.6  --  4.5 3.9*  NEUTROABS 3.6  --   --   --   HGB 10.3* 11.2* 10.1* 9.7*  HCT 30.0* 33.0* 30.9* 28.1*  MCV 92.3  --  94.5 91.8  PLT 87*  --  90* 79*    Basic Metabolic Panel: Recent Labs  Lab 11/07/21 2144 11/07/21 2150 11/08/21 0415 11/09/21 1231 11/10/21 1246  NA 138 136 138  --  134*  K 3.5 3.5 3.3* 4.2 4.5  CL 97* 95* 95*  --  90*  CO2 26  --  25  --  27  GLUCOSE 149* 145* 117*  --  120*  BUN 40* 36* 41*  --  56*  CREATININE 11.66* 11.80* 12.02*  --  12.63*  CALCIUM 9.3  --  10.2  --  9.9  PHOS  --   --   --   --  6.4*    GFR: Estimated Creatinine Clearance: 6.9 mL/min (A) (by C-G formula based on SCr of 12.63 mg/dL (H)). Liver Function Tests: Recent Labs  Lab 11/07/21 2144 11/10/21 1246  AST 11*  --   ALT 11  --   ALKPHOS 55  --   BILITOT 0.7  --   PROT 6.5  --   ALBUMIN 3.2* 3.2*    No results for input(s): "LIPASE", "AMYLASE" in the last 168 hours. No results for input(s): "AMMONIA" in the last 168 hours. Coagulation Profile: No results for input(s): "INR", "PROTIME" in the last 168  hours. Cardiac Enzymes: No results for input(s): "CKTOTAL", "CKMB", "CKMBINDEX", "TROPONINI" in the last 168 hours. BNP (last 3 results) No results for input(s): "PROBNP" in the last 8760 hours. HbA1C: No results for input(s): "HGBA1C" in the last 72 hours. CBG: Recent Labs  Lab 11/08/21 1832  GLUCAP 143*    Lipid Profile: No results for input(s): "CHOL", "HDL", "LDLCALC", "TRIG", "CHOLHDL", "LDLDIRECT" in the last 72 hours. Thyroid Function Tests: No results for input(s): "TSH", "T4TOTAL", "FREET4", "T3FREE", "THYROIDAB" in the last 72 hours. Anemia Panel: No results for input(s): "VITAMINB12", "FOLATE", "FERRITIN", "TIBC", "IRON", "RETICCTPCT" in the last 72 hours. Sepsis Labs: No results for input(s): "PROCALCITON", "LATICACIDVEN" in the last 168 hours.  No results found for this or any previous visit (from the past 240 hour(s)).       Radiology Studies: CT HEAD WO CONTRAST (5MM)  Result Date: 11/11/2021 CLINICAL DATA:  Follow-up subdural hematoma EXAM: CT HEAD WITHOUT CONTRAST TECHNIQUE: Contiguous axial images were obtained from the base of the skull through the vertex without intravenous contrast. RADIATION DOSE REDUCTION: This exam was performed according to the departmental dose-optimization program which includes automated exposure control, adjustment of the mA and/or kV according to patient size and/or use of iterative reconstruction technique. COMPARISON:  Head  CT from 3 days ago FINDINGS: Brain: Mixed density subdural hematoma along the right cerebral convexity, maximal thickness along the anterior right frontal convexity measuring up to 8 mm. Local cortical mass effect is mild. High-density subdural clot along the right tentorial leaflet is new, possibly redistribution. No hydrocephalus, infarct, or masslike finding Vascular: Arterial calcifications correlating with end-stage renal disease Skull: Normal. Negative for fracture or focal lesion. Sinuses/Orbits: Negative  IMPRESSION: Trace increase in subdural hemorrhage along the right tentorium, but unchanged maximal thickness along the right frontal convexity at 8 mm. Electronically Signed   By: Jorje Guild M.D.   On: 11/11/2021 11:44        Scheduled Meds:  amLODipine  10 mg Oral Daily   calcitRIOL  1 mcg Oral Q M,W,F-HD   Chlorhexidine Gluconate Cloth  6 each Topical Q0600   cholecalciferol  4,000 Units Oral Daily   divalproex  500 mg Oral Q12H   doxazosin  4 mg Oral Daily   guaiFENesin  1,200 mg Oral BID   isosorbide mononitrate  30 mg Oral Daily   labetalol  10 mg Intravenous Once   methocarbamol  750 mg Oral QID   metoprolol tartrate  100 mg Oral BID   multivitamin  1 tablet Oral Daily   pantoprazole  40 mg Oral BID AC   pravastatin  40 mg Oral Daily   senna-docusate  1 tablet Oral BID   sevelamer carbonate  3,200 mg Oral TID WC   Continuous Infusions:          Aline August, MD [facility] 11/12/2021, 10:37 AM

## 2021-11-12 NOTE — Progress Notes (Signed)
Physical Therapy Treatment Patient Details Name: Kirk [name] MRN: [id number] DOB: 30-Aug-1960 Today's Date: 11/12/2021   History of Present Illness Pt is a 61 y.o. male who presented to the ED 10/17 with seizure-like activity. CT revealed R frontal SDH. Near-syncopal-type episode during therapy 10/21, orthostatics stable. PMH:  ESRD on HD, anemia of CKD, carpal tunnel syndrome, chronic headaches, HTN, gout, chronic bilateral upper and lower extremity weakness due to cervical spine pathology (c spine sx 06/2012 and 02/2021), prior renal transplant, OSA on CPAP and secondary hyperparathyroidism    PT Comments    Patient progressing slowly towards PT goals. Session focused on gait training and symptom management during ambulation. Pt reports his headache worsens with walking/mobility resulting in increased nausea, vision changes ("feeling fuzzy"), eyes wanting to close due to feeling sleepy. Also noted to have worsened stubbing of right foot during advancement as well but wife reports this is not new due to nerve damage in back. Gave wife a gait belt to take home to use for safety when getting pt up. Pending progress and headache pain/management, may need to consider HHPT services instead of OPPT as pt may not be able to tolerate leaving the home safely in this state. Will reassess tomorrow. Will follow.     Recommendations for follow up therapy are one component of a multi-disciplinary discharge planning process, led by the attending physician.  Recommendations may be updated based on patient status, additional functional criteria and insurance authorization.  Follow Up Recommendations  Outpatient PT (per pt's choice, may need to consider HHPT due to recent events during therapy session)     Assistance Recommended at Discharge Frequent or constant Supervision/Assistance  Patient can return home with the following A little help with walking and/or transfers;A little help with  bathing/dressing/bathroom;Assistance with cooking/housework;Assist for transportation;Help with stairs or ramp for entrance   Equipment Recommendations  Rolling walker (2 wheels);BSC/3in1    Recommendations for Other Services       Precautions / Restrictions Precautions Precautions: Fall Precaution Comments: syncopal type symptoms 10/21, BP and VS stable Restrictions Weight Bearing Restrictions: No     Mobility  Bed Mobility Overal bed mobility: Modified Independent                  Transfers Overall transfer level: Needs assistance Equipment used: Rolling walker (2 wheels) Transfers: Sit to/from Stand Sit to Stand: Min guard           General transfer comment: Requires 2 attempts to stand from EOB pulling up on RW despite cues for proper hand placement on bed. Stood from [employer]. Leaves RW once back into room close to bed to return to seated surface.    Ambulation/Gait Ambulation/Gait assistance: Min assist Gait Distance (Feet): 120 Feet Assistive device: Rolling walker (2 wheels) Gait Pattern/deviations: Step-through pattern, Decreased stride length, Drifts right/left   Gait velocity interpretation: 1.31 - 2.62 ft/sec, indicative of limited community ambulator   General Gait Details: Slow, mildly unsteady gait worsened with increased distance due to worseneing headache, eyes closing, stubbing right foot x2 and vision changes as well as nausea. Min A needed for balance and to keep eyes opened and alert.   Stairs             Wheelchair Mobility    Modified Rankin (Stroke Patients Only) Modified Rankin (Stroke Patients Only) Pre-Morbid Rankin Score: Slight disability Modified Rankin: Moderately severe disability     Balance Overall balance assessment: Needs assistance Sitting-balance support: No upper extremity supported,  Feet supported Sitting balance-Leahy Scale: Good Sitting balance - Comments: Able to reach outside BoS and donn shoes without  difficulty.   Standing balance support: During functional activity, Bilateral upper extremity supported Standing balance-Leahy Scale: Poor Standing balance comment: Requires UE support for dynamic tasks.                            Cognition Arousal/Alertness: Awake/alert Behavior During Therapy: WFL for tasks assessed/performed, Impulsive Overall Cognitive Status: No family/caregiver present to determine baseline cognitive functioning Area of Impairment: Safety/judgement                         Safety/Judgement: Decreased awareness of safety, Decreased awareness of deficits     General Comments: Pt reports when his headache worsens, it increases his nausea, causes him to want to go to sleep and close his eyes and impacts vision, "feels fuzzy" and this all happens with walking/mobility.        Exercises      General Comments General comments (skin integrity, edema, etc.): Wife present during session.      Pertinent Vitals/Pain Pain Assessment Pain Assessment: 0-10 Pain Score: 5  Pain Location: headache worsens with mobility associated with nausea Pain Descriptors / Indicators: Headache, Guarding, Grimacing Pain Intervention(s): Monitored during session, Repositioned, Limited activity within patient's tolerance    Home Living                          Prior Function            PT Goals (current goals can now be found in the care plan section) Progress towards PT goals: Progressing toward goals (slowly)    Frequency    Min 4X/week      PT Plan Current plan remains appropriate    Co-evaluation              AM-PAC PT "6 Clicks" Mobility   Outcome Measure  Help needed turning from your back to your side while in a flat bed without using bedrails?: None Help needed moving from lying on your back to sitting on the side of a flat bed without using bedrails?: None Help needed moving to and from a bed to a chair (including a  wheelchair)?: A Little Help needed standing up from a chair using your arms (e.g., wheelchair or bedside chair)?: A Little Help needed to walk in hospital room?: A Little Help needed climbing 3-5 steps with a railing? : A Lot 6 Click Score: 19    End of Session Equipment Utilized During Treatment: Gait belt Activity Tolerance: Patient limited by pain Patient left: in bed;with call bell/phone within reach;with bed alarm set;with family/visitor present (stting EOB with wife present) Nurse Communication: Mobility status PT Visit Diagnosis: Unsteadiness on feet (R26.81);Muscle weakness (generalized) (M62.81);Pain Pain - part of body:  (headache)     Time: 1141-1200 PT Time Calculation (min) (ACUTE ONLY): 19 min  Charges:  $Gait Training: 8-22 mins                     Marisa Severin, PT, DPT Acute Rehabilitation Services Secure chat preferred Office [place] 11/12/2021, 12:59 PM

## 2021-11-13 ENCOUNTER — Ambulatory Visit: Payer: Medicare Other | Admitting: Podiatry

## 2021-11-13 DIAGNOSIS — S065XAA Traumatic subdural hemorrhage with loss of consciousness status unknown, initial encounter: Secondary | ICD-10-CM | POA: Diagnosis not present

## 2021-11-13 DIAGNOSIS — R569 Unspecified convulsions: Secondary | ICD-10-CM | POA: Diagnosis not present

## 2021-11-13 DIAGNOSIS — D696 Thrombocytopenia, unspecified: Secondary | ICD-10-CM | POA: Diagnosis not present

## 2021-11-13 DIAGNOSIS — N186 End stage renal disease: Secondary | ICD-10-CM | POA: Diagnosis not present

## 2021-11-13 LAB — CBC WITH DIFFERENTIAL/PLATELET
Abs Immature Granulocytes: 0.01 10*3/uL (ref 0.00–0.07)
Basophils Absolute: 0.1 10*3/uL (ref 0.0–0.1)
Basophils Relative: 1 %
Eosinophils Absolute: 0.1 10*3/uL (ref 0.0–0.5)
Eosinophils Relative: 1 %
HCT: 26.6 % — ABNORMAL LOW (ref 39.0–52.0)
Hemoglobin: 8.9 g/dL — ABNORMAL LOW (ref 13.0–17.0)
Immature Granulocytes: 0 %
Lymphocytes Relative: 14 %
Lymphs Abs: 0.8 10*3/uL (ref 0.7–4.0)
MCH: 30.8 pg (ref 26.0–34.0)
MCHC: 33.5 g/dL (ref 30.0–36.0)
MCV: 92 fL (ref 80.0–100.0)
Monocytes Absolute: 0.5 10*3/uL (ref 0.1–1.0)
Monocytes Relative: 9 %
Neutro Abs: 4.3 10*3/uL (ref 1.7–7.7)
Neutrophils Relative %: 75 %
Platelets: 89 10*3/uL — ABNORMAL LOW (ref 150–400)
RBC: 2.89 MIL/uL — ABNORMAL LOW (ref 4.22–5.81)
RDW: 13.2 % (ref 11.5–15.5)
WBC: 5.8 10*3/uL (ref 4.0–10.5)
nRBC: 0 % (ref 0.0–0.2)

## 2021-11-13 LAB — BASIC METABOLIC PANEL
Anion gap: 17 — ABNORMAL HIGH (ref 5–15)
BUN: 64 mg/dL — ABNORMAL HIGH (ref 6–20)
CO2: 24 mmol/L (ref 22–32)
Calcium: 9 mg/dL (ref 8.9–10.3)
Chloride: 89 mmol/L — ABNORMAL LOW (ref 98–111)
Creatinine, Ser: 13.05 mg/dL — ABNORMAL HIGH (ref 0.61–1.24)
GFR, Estimated: 4 mL/min — ABNORMAL LOW (ref >60)
Glucose, Bld: 98 mg/dL (ref 70–99)
Potassium: 4.8 mmol/L (ref 3.5–5.1)
Sodium: 130 mmol/L — ABNORMAL LOW (ref 135–145)

## 2021-11-13 LAB — MAGNESIUM: Magnesium: 2.6 mg/dL — ABNORMAL HIGH (ref 1.7–2.4)

## 2021-11-13 MED ORDER — LEVETIRACETAM IN NACL 1500 MG/100ML IV SOLN
1500.0000 mg | Freq: Once | INTRAVENOUS | Status: AC
  Administered 2021-11-13: 1500 mg via INTRAVENOUS
  Filled 2021-11-13: qty 100

## 2021-11-13 MED ORDER — SODIUM CHLORIDE 0.9 % IV SOLN
250.0000 mg | INTRAVENOUS | Status: DC
  Administered 2021-11-13 – 2021-11-20 (×4): 250 mg via INTRAVENOUS
  Filled 2021-11-13 (×4): qty 2.5

## 2021-11-13 MED ORDER — LEVETIRACETAM IN NACL 500 MG/100ML IV SOLN
500.0000 mg | INTRAVENOUS | Status: DC
  Administered 2021-11-15 – 2021-11-21 (×7): 500 mg via INTRAVENOUS
  Filled 2021-11-13 (×7): qty 100

## 2021-11-13 MED ORDER — LOSARTAN POTASSIUM 25 MG PO TABS
25.0000 mg | ORAL_TABLET | Freq: Every day | ORAL | Status: DC
  Administered 2021-11-13: 25 mg via ORAL
  Filled 2021-11-13 (×2): qty 1

## 2021-11-13 MED ORDER — PREGABALIN 25 MG PO CAPS
25.0000 mg | ORAL_CAPSULE | Freq: Every day | ORAL | Status: DC
  Administered 2021-11-13 – 2021-11-20 (×7): 25 mg via ORAL
  Filled 2021-11-13 (×7): qty 1

## 2021-11-13 NOTE — Progress Notes (Signed)
Nephrology progress note:  Subjective:  last HD on 10/20 with 1.8 kg uf.  He's not sure of discharge plans - still getting over the nausea though   Review of systems:  Has had a headache He reports nausea - last PRN helped Denies shortness of breath or chest pain   Objective Vital signs in last 24 hours: Vitals:   11/12/21 2216 11/13/21 0034 11/13/21 0330 11/13/21 0737  BP: (!) 180/74 (!) 168/66 (!) 167/84 138/83  Pulse:  74 68 78  Resp:  '18 20 19  '$ Temp:  98.6 F (37 C) 98 F (36.7 C)   TempSrc:  Oral    SpO2:  97% 98% 96%  Weight:      Height:       Weight change:  No intake or output data in the 24 hours ending 11/13/21 0758   Dialysis Orders:  [facility] MWF  4 hrs 15 min, BFR 450/AF 1.5  EDW 89.9, 2 K/ 2 Ca, UFP 4   Access: LU AVF  Hep 2500   STOP Heparin (has spontaneous subdural hematoma) Sensipar 60 mg qHD Calcitriol 1.25 mcg PO qHD   Assessment/Plan: New onset seizure -Possibly 2/2 Right SDH per neurology. Neurology following. On depakote. Seizure/fall precautions.  Subdural hematoma - Repeat head CT 10/21 showed maybe increased SDH. Neurosurgery has recommended conservative management with outpatient follow-up with them in clinic  ESRD -  On dialysis MWF. HD 10/18 stopped early due to nausea, also on 10/20.   No Heparin. Hypokalemia - resolved and now on 2K bath here    Hypertension/volume  - Does not appear fluid overloaded, although crackles present on exam.  Plan for UF as tolerated, usually removes around 3L with HD.  Continue current regimen and start losartan   Anemia of CKD - previously without indication for ESA and now has fallen.  If persists will need to start if outpatient dosing hx would permit   Secondary Hyperparathyroidism -  Ca 10.2, continue Sensipar 60 mg qHD and calcitriol. Sevelamer with meals.  Nutrition -  Renal diet with fluid restriction   OSA on CPAP - CPAP at night Chronic thrombocytopenia  - stable  Disposition per primary team     Labs: Basic Metabolic Panel: Recent Labs  Lab 11/08/21 0415 11/09/21 1231 11/10/21 1246 11/13/21 0435  NA 138  --  134* 130*  K 3.3* 4.2 4.5 4.8  CL 95*  --  90* 89*  CO2 25  --  27 24  GLUCOSE 117*  --  120* 98  BUN 41*  --  56* 64*  CREATININE 12.02*  --  12.63* 13.05*  CALCIUM 10.2  --  9.9 9.0  PHOS  --   --  6.4*  --    Liver Function Tests: Recent Labs  Lab 11/07/21 2144 11/10/21 1246  AST 11*  --   ALT 11  --   ALKPHOS 55  --   BILITOT 0.7  --   PROT 6.5  --   ALBUMIN 3.2* 3.2*   No results for input(s): "LIPASE", "AMYLASE" in the last 168 hours. No results for input(s): "AMMONIA" in the last 168 hours. CBC: Recent Labs  Lab 11/07/21 2144 11/07/21 2150 11/08/21 0415 11/10/21 1246 11/13/21 0435  WBC 4.6  --  4.5 3.9* 5.8  NEUTROABS 3.6  --   --   --  4.3  HGB 10.3*   < > 10.1* 9.7* 8.9*  HCT 30.0*   < > 30.9* 28.1* 26.6*  MCV 92.3  --  94.5 91.8 92.0  PLT 87*  --  90* 79* 89*   < > = values in this interval not displayed.   Cardiac Enzymes: No results for input(s): "CKTOTAL", "CKMB", "CKMBINDEX", "TROPONINI" in the last 168 hours. CBG: Recent Labs  Lab 11/08/21 1832  GLUCAP 143*    Iron Studies: No results for input(s): "IRON", "TIBC", "TRANSFERRIN", "FERRITIN" in the last 72 hours. Studies/Results: CT HEAD WO CONTRAST (5MM)  Result Date: 11/11/2021 CLINICAL DATA:  Follow-up subdural hematoma EXAM: CT HEAD WITHOUT CONTRAST TECHNIQUE: Contiguous axial images were obtained from the base of the skull through the vertex without intravenous contrast. RADIATION DOSE REDUCTION: This exam was performed according to the departmental dose-optimization program which includes automated exposure control, adjustment of the mA and/or kV according to patient size and/or use of iterative reconstruction technique. COMPARISON:  Head CT from 3 days ago FINDINGS: Brain: Mixed density subdural hematoma along the right cerebral convexity, maximal thickness along the  anterior right frontal convexity measuring up to 8 mm. Local cortical mass effect is mild. High-density subdural clot along the right tentorial leaflet is new, possibly redistribution. No hydrocephalus, infarct, or masslike finding Vascular: Arterial calcifications correlating with end-stage renal disease Skull: Normal. Negative for fracture or focal lesion. Sinuses/Orbits: Negative IMPRESSION: Trace increase in subdural hemorrhage along the right tentorium, but unchanged maximal thickness along the right frontal convexity at 8 mm. Electronically Signed   By: Jorje Guild M.D.   On: 11/11/2021 11:44   Medications: Infusions:    Scheduled Medications:  amLODipine  10 mg Oral Daily   calcitRIOL  1 mcg Oral Q M,W,F-HD   Chlorhexidine Gluconate Cloth  6 each Topical Q0600   cholecalciferol  4,000 Units Oral Daily   divalproex  500 mg Oral Q12H   doxazosin  4 mg Oral Daily   guaiFENesin  1,200 mg Oral BID   isosorbide mononitrate  30 mg Oral Daily   labetalol  10 mg Intravenous Once   methocarbamol  750 mg Oral QID   metoprolol tartrate  100 mg Oral BID   multivitamin  1 tablet Oral Daily   pantoprazole  40 mg Oral BID AC   pravastatin  40 mg Oral Daily   senna-docusate  1 tablet Oral BID   sevelamer carbonate  3,200 mg Oral TID WC    have reviewed scheduled and prn medications.  Physical Exam:  General adult male in bed in no acute distress HEENT normocephalic atraumatic extraocular movements intact sclera anicteric Neck supple trachea midline Lungs clear to auscultation bilaterally normal work of breathing at rest  Heart S1S2 no rub Abdomen soft nontender nondistended Extremities no edema  Psych normal mood and affect Access LUE AVF bruit and thrill    Claudia Desanctis, MD 11/13/2021,8:29 AM

## 2021-11-13 NOTE — Progress Notes (Addendum)
Neurology Progress Note  Brief HPI:  61 y.o. 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  S:// Patient is laying in bed, calm alert and oriented. He is c/o constant headache and nausea. He does state that he is able to eat and drink with no vomiting.     O:// Current vital signs: BP 138/83 (BP Location: Right Arm)   Pulse 78   Temp 98.3 F (36.8 C) (Oral)   Resp 19   Ht '5\' 7"'$  (1.702 m)   Wt 93.4 kg   SpO2 96%   BMI 32.25 kg/m  Vital signs in last 24 hours: Temp:  [97.8 F (36.6 C)-98.6 F (37 C)] 98.3 F (36.8 C) (10/23 0737) Pulse Rate:  [62-78] 78 (10/23 0737) Resp:  [18-20] 19 (10/23 0737) BP: (138-180)/(63-84) 138/83 (10/23 0737) SpO2:  [96 %-98 %] 96 % (10/23 0737)  GENERAL: restless. Awake, alert in NAD HEENT: - Normocephalic and atraumatic, dry mm LUNGS - Clear to auscultation bilaterally with no wheezes CV - S1S2 RRR, no m/r/g, equal pulses bilaterally. ABDOMEN - Soft, nontender, nondistended with normoactive BS Ext: warm, well perfused, intact peripheral pulses, no edema. Left AVF   NEURO:  Mental Status: AA&Ox3  Language: speech is clear.  Naming, repetition, fluency, and comprehension intact. Cranial Nerves: PERRL, EOMI, visual fields full, no facial asymmetry, facial sensation intact, hearing intact, tongue/uvula/soft palate midline, normal sternocleidomastoid and trapezius muscle strength. No evidence of tongue atrophy or fibrillations Motor: 4/5 in all 4 extremities  Tone: is normal and bulk is normal Sensation- Intact to light touch bilaterally Coordination: FTN intact bilaterally, no ataxia in BLE. Gait-  deferred   Medications  Current Facility-Administered Medications:    acetaminophen (TYLENOL) tablet 650 mg, 650 mg, Oral, Q6H PRN, 650 mg at 11/12/21 0759 **OR** acetaminophen (TYLENOL) suppository 650 mg, 650 mg, Rectal, Q6H PRN, Alcario Drought, Jared M, DO   amLODipine (NORVASC) tablet 10 mg, 10 mg, Oral, Daily, Alcario Drought, Jared M, DO, 10 mg at 11/12/21 1043   bisacodyl (DULCOLAX) suppository 10 mg, 10 mg, Rectal, Daily PRN, Starla Link, Kshitiz, MD   calcitRIOL (ROCALTROL) capsule 1 mcg, 1 mcg, Oral, Q M,W,F-HD, Alcario Drought, Jared M, DO, 1 mcg at 11/08/21 2026   Chlorhexidine Gluconate Cloth 2 % PADS 6 each, 6 each, Topical, Q0600, Corliss Parish, MD, 6 each at 11/13/21 0407   cholecalciferol (VITAMIN D3) 25 MCG (1000 UNIT) tablet 4,000 Units, 4,000 Units, Oral, Daily, Etta Quill, DO, 4,000 Units at 11/12/21 1042   divalproex (DEPAKOTE) DR tablet 500 mg, 500 mg, Oral, Q12H, Alekh, Kshitiz, MD, 500 mg at 11/12/21 2122   doxazosin (CARDURA) tablet 4 mg, 4 mg, Oral, Daily, Alcario Drought, Jared M, DO, 4 mg at 11/12/21 1030   guaiFENesin (MUCINEX) 12 hr tablet 1,200 mg, 1,200 mg, Oral, BID, Alcario Drought, Jared M, DO, 1,200 mg at 11/12/21 2121   hydrALAZINE (APRESOLINE) injection 10-20 mg, 10-20 mg, Intravenous, Q4H PRN, Etta Quill, DO, 20 mg at 11/12/21 2216   HYDROmorphone (DILAUDID) injection 0.5 mg, 0.5 mg, Intravenous, Q4H PRN, Starla Link, Kshitiz, MD, 0.5 mg at 11/13/21 0403   isosorbide mononitrate (IMDUR) 24 hr tablet 30 mg, 30 mg, Oral, Daily, Jennette Kettle  M, DO, 30 mg at 11/12/21 1043   labetalol (NORMODYNE) injection 10 mg, 10 mg, Intravenous, Once, Gareth Morgan, MD   losartan (COZAAR) tablet 25 mg, 25 mg, Oral, Daily, Claudia Desanctis, MD   methocarbamol (ROBAXIN) tablet 750 mg, 750 mg, Oral, QID, Alcario Drought, Jared M, DO, 750 mg at 11/12/21 2121   metoCLOPramide (REGLAN) injection 10 mg, 10 mg, Intravenous, Q6H PRN, Starla Link, Kshitiz, MD, 10 mg at 11/12/21 1415   metoprolol tartrate (LOPRESSOR) tablet  100 mg, 100 mg, Oral, BID, Alcario Drought, Jared M, DO, 100 mg at 11/12/21 2121   multivitamin (RENA-VIT) tablet 1 tablet, 1 tablet, Oral, Daily, Etta Quill, DO, 1 tablet at 11/12/21 1043   nitroGLYCERIN (NITROSTAT) SL tablet 0.4 mg, 0.4 mg, Sublingual, Q5 min PRN, Starla Link, Kshitiz, MD   ondansetron (ZOFRAN) tablet 4 mg, 4 mg, Oral, Q6H PRN, 4 mg at 11/12/21 1634 **OR** ondansetron (ZOFRAN) injection 4 mg, 4 mg, Intravenous, Q6H PRN, Etta Quill, DO, 4 mg at 11/13/21 0403   Oral care mouth rinse, 15 mL, Mouth Rinse, PRN, Starla Link, Kshitiz, MD   oxyCODONE (Oxy IR/ROXICODONE) immediate release tablet 5-10 mg, 5-10 mg, Oral, Q4H PRN, Starla Link, Kshitiz, MD, 10 mg at 11/12/21 2122   pantoprazole (PROTONIX) EC tablet 40 mg, 40 mg, Oral, BID AC, Gardner, Jared M, DO, 40 mg at 11/12/21 1634   polyethylene glycol (MIRALAX / GLYCOLAX) packet 17 g, 17 g, Oral, Daily PRN, Starla Link, Kshitiz, MD   pravastatin (PRAVACHOL) tablet 40 mg, 40 mg, Oral, Daily, Alcario Drought, Jared M, DO, 40 mg at 11/12/21 1042   senna-docusate (Senokot-S) tablet 1 tablet, 1 tablet, Oral, BID, Alekh, Kshitiz, MD, 1 tablet at 11/12/21 2122   sevelamer carbonate (RENVELA) tablet 1,600 mg, 1,600 mg, Oral, PRN, Etta Quill, DO   sevelamer carbonate (RENVELA) tablet 3,200 mg, 3,200 mg, Oral, TID WC, Jennette Kettle M, DO, 3,200 mg at 11/12/21 1635   traZODone (DESYREL) tablet 50 mg, 50 mg, Oral, QHS PRN, Starla Link, Kshitiz, MD, 50 mg at 11/12/21 2122 Labs CBC    Component Value Date/Time   WBC 5.8 11/13/2021 0435   RBC 2.89 (L) 11/13/2021 0435   HGB 8.9 (L) 11/13/2021 0435   HGB 10.0 (L) 08/24/2021 1005   HCT 26.6 (L) 11/13/2021 0435   PLT 89 (L) 11/13/2021 0435   PLT 86 (L) 08/24/2021 1005   MCV 92.0 11/13/2021 0435   MCH 30.8 11/13/2021 0435   MCHC 33.5 11/13/2021 0435   RDW 13.2 11/13/2021 0435   RDW 16.7 (H) 11/26/2013 0914   LYMPHSABS 0.8 11/13/2021 0435   LYMPHSABS 1.3 11/26/2013 0914   MONOABS 0.5 11/13/2021 0435   EOSABS 0.1  11/13/2021 0435   EOSABS 0.2 11/26/2013 0914   BASOSABS 0.1 11/13/2021 0435   BASOSABS 0.1 11/26/2013 0914    CMP     Component Value Date/Time   NA 130 (L) 11/13/2021 0435   NA 136 11/26/2013 0914   K 4.8 11/13/2021 0435   CL 89 (L) 11/13/2021 0435   CO2 24 11/13/2021 0435   GLUCOSE 98 11/13/2021 0435   BUN 64 (H) 11/13/2021 0435   BUN 66 (A) 05/25/2015 0000   BUN 66 (A) 05/25/2015 0000   CREATININE 13.05 (H) 11/13/2021 0435   CREATININE 11.65 (HH) 05/16/2021 1611   CREATININE 8.37 (H) 02/03/2018 1523   CALCIUM 9.0 11/13/2021 0435   CALCIUM 8.8 06/08/2015 0000   CALCIUM 7.9 (L) 01/19/2010 1016   PROT 6.5 11/07/2021 2144   PROT 6.7 08/27/2013 1351  ALBUMIN 3.2 (L) 11/10/2021 1246   ALBUMIN 3.6 08/27/2013 1351   AST 11 (L) 11/07/2021 2144   AST 11 (L) 05/16/2021 1611   ALT 11 11/07/2021 2144   ALT 9 05/16/2021 1611   ALKPHOS 55 11/07/2021 2144   BILITOT 0.7 11/07/2021 2144   BILITOT 0.4 05/16/2021 1611   GFRNONAA 4 (L) 11/13/2021 0435   GFRNONAA 5 (L) 05/16/2021 1611   GFRAA 4 (L) 07/15/2019 0704    glycosylated hemoglobin  Lipid Panel     Component Value Date/Time   CHOL 91 05/25/2021 0814   CHOL 195 06/29/2015 1533   TRIG 95 05/25/2021 0814   HDL 31 (L) 05/25/2021 0814   HDL 17 (L) 06/29/2015 1533   CHOLHDL 2.9 05/25/2021 0814   VLDL 32 01/28/2018 0242   LDLCALC 42 05/25/2021 0814     Imaging I have reviewed images in epic and the results pertinent to this consultation are:  CT-scan of the brain 10/17 Acute subdural hematoma overlying the right frontal convexity measuring 9 mm in greatest radial diameter. Mild adjacent mass effect and 1-2 mm of leftward midline shift at the septum pellucidum  Stability scan Ct brain 10/18 Unchanged subdural hematoma along the right cerebral convexity measuring up to 9 mm at the anterior frontal lobe.  Repeat T head 10/21 Trace increase in subdural hemorrhage along the right tentorium, but unchanged maximal  thickness along the right frontal convexity at 41m.  LABS  VPA 15 Assessment:  61year old dialysis patient with acute right SDH with new onset seizure  New onset seizure due to right SDH  Intractable headache   Recommendations: - seizure precautions  - Discontinue Depakote  - Load Keppra '1500mg'$  IV x1, then '500mg'$  IV daily, and '250mg'$  IV after HD on MWF - Start Lyrica 25 mg daily  - Neurology will continue to follow   DBeulah GandyDNP, ACreolaPer NUrology Surgery Center Of Savannah LlLPstatutes, patients with seizures are not allowed to drive until they have been seizure-free for six months.   Use caution when using heavy equipment or power tools. Avoid working on ladders or at heights. Take showers instead of baths. Ensure the water temperature is not too high on the home water heater. Do not go swimming alone. Do not lock yourself in a room alone (i.e. bathroom). When caring for infants or small children, sit down when holding, feeding, or changing them to minimize risk of injury to the child in the event you have a seizure. Maintain good sleep hygiene. Avoid alcohol.    If patient has another seizure, call 911 and bring them back to the ED if: A.  The seizure lasts longer than 5 minutes.      B.  The patient doesn't wake shortly after the seizure or has new problems such as difficulty seeing, speaking or moving following the seizure C.  The patient was injured during the seizure D.  The patient has a temperature over 102 F (39C) E.  The patient vomited during the seizure and now is having trouble breathing   DBeulah GandyDNP, ACNPC-AG    Attending Neurohospitalist Addendum Patient seen and examined after APP/Resident. Agree with the history and physical as documented above. Agree with the plan as documented, which I helped formulate. I have edited the note above to reflect my full findings and recommendations. I have independently reviewed the chart, obtained history,  review of systems and examined the patient.I have personally reviewed pertinent head/neck/spine imaging (CT/MRI). Please  feel free to call with any questions.  VPA level was subtherapeutic. Keppra is preferred to VPA in setting of bleed and head trauma therefore will d/c depakote and reload keppra + continue the latter. Lyrica, also an AED, was added for headache at ESRD dosing. Will continue to follow.  -- Su Monks, MD [facility] [phone]  If 7pm- 7am, please page neurology on call as listed in [place].

## 2021-11-13 NOTE — Progress Notes (Signed)
Compazine seemed help somehow than previously given medications per nurse observation. Patient seemed little more calm and relaxed, less restless even though patient said did not help. Marland Kitchen

## 2021-11-13 NOTE — Progress Notes (Signed)
Mobility Specialist Progress Note   11/13/21 1020  Mobility  Activity Ambulated with assistance in room;Ambulated with assistance to bathroom  Level of Assistance Minimal assist, patient does 75% or more  Assistive Device Front wheel walker  Distance Ambulated (ft) 18 ft  Range of Motion/Exercises Active;All extremities  Activity Response Tolerated well   Patient received standing at bedside with bed alarm active, requested assistance to restroom. Ambulated min guard to min A with RW. Tolerated without complaint or incident. Was left in restroom with all needs met, NT present.  [place] Lillie Portner, [place], [place]  Office: [phone]

## 2021-11-13 NOTE — Progress Notes (Signed)
Received patient in bed to unit.  Drowsy oriented restless follows commands, reports of nausea and headache.  Informed consent signed and in chart.   Treatment initiated: 1512 Treatment completed: 1950  Patient with sbp decreased from 140s to 105, stopped uf removal for 5 to 6 minutes Transported back to the room  Drowsy restless following commands no acute distress.  Called to give hand off report, 3W nurse not available.    Access used: fistula left arm  Access issues: none.  Total UF removed: 2.9 liters Medication(s) given: zofran, amlodipine, losartan Post HD VS: 146/73 MAP 82 HR 90 RR 20 Sat 100% on room air Temp oral 98.6  Post HD weight:    [facility]

## 2021-11-13 NOTE — Progress Notes (Signed)
PROGRESS NOTE    Kirk [name]  XNT:[id number] DOB: 1960-09-02 DOA: 11/07/2021  PCP: Sandrea Hughs, NP   Brief Narrative:  This 61 year old male with PMH significant of end-stage renal disease on hemodialysis, Alport syndrome, hypertension, chronic thrombocytopenia, anemia of chronic disease, chronic headaches, hyperlipidemia, OSA on CPAP, pancytopenia, diabetes mellitus type 2, memory impairment, chronic diastolic CHF, chronic bilateral upper extremity and lower extremity weakness since C-spine surgery in February 2023 presented with new onset seizure at home lasting for 2 minutes.  On presentation, CT of the head showed subdural hematoma.  Neurology and Neurosurgery were consulted.  He was started on Keppra.  Keppra was subsequently switched to Depakote on 11/11/2021 because of persistent headaches.  Assessment & Plan:   Principal Problem:   SDH (subdural hematoma) (HCC) Active Problems:   Seizure (HCC)   Thrombocytopenia (HCC)   OSA on CPAP   ESRD on dialysis [facility])   Essential hypertension   Alports syndrome  New onset seizures: Possibly due to right subdural hematoma as per neurology. Patient was started on Keppra,  renally dosed. Continue seizure/fall precautions. Keppra was switched to Depakote on 10/21 after discussion with neurology. Neurology states there is a risk of bleeding and increased mortality with Depakote. Patient was placed back on Keppra today, Depakote level subtherapeutic. Depakote discontinued. Continue Keppra renally adjusted dose.  Subdural hematoma: Repeat CT head still showed some subdural hematoma. Neurosurgery recommended conservative management. Patient still complaining about severe headache, throbbing Repeat CT head on 10/21 showed trace increase in subdural hemorrhage along the right tentorium. Patient was reevaluated by neurosurgery Dr. Kathyrn Sheriff. Continue supportive care, follow-up neurosurgery clinic. Neurology started Lyrica for  headaches.  End-stage renal disease on hemodialysis: Nephrology following, continue dialysis as per schedule.  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  Essential hypertension: Blood pressure still slightly elevated. Continue amlodipine, Imdur, metoprolol.  OSA on CPAP: Continue CPAP at night.  Anemia of chronic disease: H&H remains stable.  Monitor intermittently.  Chronic thrombocytopenia: Platelets are stable, monitor intermittently.  Hypokalemia: Replaced and resolved.  GERD: Continue pantoprazole.  Hyperlipidemia Continue statins  Diabetes mellitus type 2: Blood sugars are mostly stable. Continue regular insulin sliding scale  Obesity: Diet and exercise discussed in detail. Estimated body mass index is 32.25 kg/m as calculated from the following:   Height as of this encounter: '5\' 7"'$  (1.702 m).   Weight as of this encounter: 93.4 kg.   Chronic diastolic CHF: Volume is managed by dialysis Daily weight, intake output charting, fluid restriction  Physical deconditioning: He recommended outpatient PT.  DVT prophylaxis: SCDs Code Status: Full code Family Communication:No family at bed side. Disposition Plan:   Status is: Inpatient Remains inpatient appropriate because: Severity of illness.  Not medically clear.   Consultants:  Neurology Neurosurgery Nephrology  Procedures: None  Antimicrobials: None  Subjective: Patient was seen and examined at bedside.  Overnight events noted.  Patient still complains of intermittent throbbing headache with intermittent nausea.  States he is not ready to go home. Denies any chest pain,  shortness of breath.  Objective: Vitals:   11/12/21 2216 11/13/21 0034 11/13/21 0330 11/13/21 0737  BP: (!) 180/74 (!) 168/66 (!) 167/84 138/83  Pulse:  74 68 78  Resp:  '18 20 19  '$ Temp:  98.6 F (37 C) 98 F (36.7 C) 98.3 F (36.8 C)  TempSrc:  Oral  Oral  SpO2:  97% 98% 96%   Weight:      Height:  No intake or output data in the 24 hours ending 11/13/21 1107 [employer]   11/07/21 2053 11/10/21 2030 11/10/21 2033  Weight: 90.7 kg 91.6 kg 93.4 kg   Examination:  General exam: Appears comfortable, chronically ill looking and very deconditioned. Respiratory system: CTA bilaterally, respiratory effort normal, RR 15 Cardiovascular system: S1 & S2 heard, regular rate and rhythm, no murmur. Gastrointestinal system: Abdomen is soft, non tender, non distended, BS+ Central nervous system: Alert and oriented x 2. No focal neurological deficits.  Slow to respond. Extremities: No edema, no cyanosis, no clubbing Skin: No rashes, lesions or ulcers Psychiatry: Judgement and insight appear normal. Mood & affect appropriate.   Data Reviewed: I have personally reviewed following labs and imaging studies  CBC: Recent Labs  Lab 11/07/21 2144 11/07/21 2150 11/08/21 0415 11/10/21 1246 11/13/21 0435  WBC 4.6  --  4.5 3.9* 5.8  NEUTROABS 3.6  --   --   --  4.3  HGB 10.3* 11.2* 10.1* 9.7* 8.9*  HCT 30.0* 33.0* 30.9* 28.1* 26.6*  MCV 92.3  --  94.5 91.8 92.0  PLT 87*  --  90* 79* 89*   Basic Metabolic Panel: Recent Labs  Lab 11/07/21 2144 11/07/21 2150 11/08/21 0415 11/09/21 1231 11/10/21 1246 11/13/21 0435  NA 138 136 138  --  134* 130*  K 3.5 3.5 3.3* 4.2 4.5 4.8  CL 97* 95* 95*  --  90* 89*  CO2 26  --  25  --  27 24  GLUCOSE 149* 145* 117*  --  120* 98  BUN 40* 36* 41*  --  56* 64*  CREATININE 11.66* 11.80* 12.02*  --  12.63* 13.05*  CALCIUM 9.3  --  10.2  --  9.9 9.0  MG  --   --   --   --   --  2.6*  PHOS  --   --   --   --  6.4*  --    GFR: Estimated Creatinine Clearance: 6.6 mL/min (A) (by C-G formula based on SCr of 13.05 mg/dL (H)). Liver Function Tests: Recent Labs  Lab 11/07/21 2144 11/10/21 1246  AST 11*  --   ALT 11  --   ALKPHOS 55  --   BILITOT 0.7  --   PROT 6.5  --   ALBUMIN 3.2* 3.2*   No results for input(s):  "LIPASE", "AMYLASE" in the last 168 hours. No results for input(s): "AMMONIA" in the last 168 hours. Coagulation Profile: No results for input(s): "INR", "PROTIME" in the last 168 hours. Cardiac Enzymes: No results for input(s): "CKTOTAL", "CKMB", "CKMBINDEX", "TROPONINI" in the last 168 hours. BNP (last 3 results) No results for input(s): "PROBNP" in the last 8760 hours. HbA1C: No results for input(s): "HGBA1C" in the last 72 hours. CBG: Recent Labs  Lab 11/08/21 1832  GLUCAP 143*   Lipid Profile: No results for input(s): "CHOL", "HDL", "LDLCALC", "TRIG", "CHOLHDL", "LDLDIRECT" in the last 72 hours. Thyroid Function Tests: No results for input(s): "TSH", "T4TOTAL", "FREET4", "T3FREE", "THYROIDAB" in the last 72 hours. Anemia Panel: No results for input(s): "VITAMINB12", "FOLATE", "FERRITIN", "TIBC", "IRON", "RETICCTPCT" in the last 72 hours. Sepsis Labs: No results for input(s): "PROCALCITON", "LATICACIDVEN" in the last 168 hours.  No results found for this or any previous visit (from the past 240 hour(s)).   Radiology Studies: CT HEAD WO CONTRAST (5MM)  Result Date: 11/11/2021 CLINICAL DATA:  Follow-up subdural hematoma EXAM: CT HEAD WITHOUT CONTRAST TECHNIQUE: Contiguous axial images were obtained from the  base of the skull through the vertex without intravenous contrast. RADIATION DOSE REDUCTION: This exam was performed according to the departmental dose-optimization program which includes automated exposure control, adjustment of the mA and/or kV according to patient size and/or use of iterative reconstruction technique. COMPARISON:  Head CT from 3 days ago FINDINGS: Brain: Mixed density subdural hematoma along the right cerebral convexity, maximal thickness along the anterior right frontal convexity measuring up to 8 mm. Local cortical mass effect is mild. High-density subdural clot along the right tentorial leaflet is new, possibly redistribution. No hydrocephalus, infarct, or  masslike finding Vascular: Arterial calcifications correlating with end-stage renal disease Skull: Normal. Negative for fracture or focal lesion. Sinuses/Orbits: Negative IMPRESSION: Trace increase in subdural hemorrhage along the right tentorium, but unchanged maximal thickness along the right frontal convexity at 8 mm. Electronically Signed   By: Jorje Guild M.D.   On: 11/11/2021 11:44    Scheduled Meds:  amLODipine  10 mg Oral Daily   calcitRIOL  1 mcg Oral Q M,W,F-HD   Chlorhexidine Gluconate Cloth  6 each Topical Q0600   cholecalciferol  4,000 Units Oral Daily   doxazosin  4 mg Oral Daily   guaiFENesin  1,200 mg Oral BID   isosorbide mononitrate  30 mg Oral Daily   labetalol  10 mg Intravenous Once   losartan  25 mg Oral Daily   methocarbamol  750 mg Oral QID   metoprolol tartrate  100 mg Oral BID   multivitamin  1 tablet Oral Daily   pantoprazole  40 mg Oral BID AC   pravastatin  40 mg Oral Daily   pregabalin  25 mg Oral Daily   senna-docusate  1 tablet Oral BID   sevelamer carbonate  3,200 mg Oral TID WC   Continuous Infusions:  levETIRAcetam     levETIRAcetam     Followed by   Derrill Memo ON 11/14/2021] levETIRAcetam       LOS: 5 days    Time spent: 50 mins  Jabaree Mercado, MD [facility]   If 7PM-7AM, please contact night-coverage

## 2021-11-13 NOTE — Progress Notes (Signed)
Physical Therapy Treatment Patient Details Name: Kirk [name] MRN: [id number] DOB: 1960/04/24 Today's Date: 11/13/2021   History of Present Illness Pt is a 61 y.o. male who presented to the ED 10/17 with seizure-like activity. CT revealed R frontal SDH. Near-syncopal-type episode during therapy 10/21, orthostatics stable. PMH:  ESRD on HD, anemia of CKD, carpal tunnel syndrome, chronic headaches, HTN, gout, chronic bilateral upper and lower extremity weakness due to cervical spine pathology (c spine sx 06/2012 and 02/2021), prior renal transplant, OSA on CPAP and secondary hyperparathyroidism    PT Comments    Patient not progressing towards PT goals due to continued headache, nausea, dizziness and episode of legs giving out during session today with therapist lowering pt down into chair. It appears to present almost like a presyncope episode with LEs buckling however pt not able to give any warning despite being asked. Requesting to return to supine due to headache pain. Discharge recommendation updated to home with HHPT as pt not progressing at this time due to above symptoms and concerned about him getting to OPPT. Wife has a gait belt. If pt is not able to progress with mobility, may need to consider AIR? Will need to see wife's comfort level with managing pt at home in this condition. Will follow.    Recommendations for follow up therapy are one component of a multi-disciplinary discharge planning process, led by the attending physician.  Recommendations may be updated based on patient status, additional functional criteria and insurance authorization.  Follow Up Recommendations  Home health PT     Assistance Recommended at Discharge Frequent or constant Supervision/Assistance  Patient can return home with the following A little help with bathing/dressing/bathroom;Assistance with cooking/housework;Assist for transportation;Help with stairs or ramp for entrance;Two people to help with walking  and/or transfers   Equipment Recommendations  Rolling walker (2 wheels);BSC/3in1    Recommendations for Other Services       Precautions / Restrictions Precautions Precautions: Fall Precaution Comments: syncopal type symptoms 10/23 Restrictions Weight Bearing Restrictions: No     Mobility  Bed Mobility Overal bed mobility: Modified Independent             General bed mobility comments: Performed x4 throughout session due to needing to return to supine due to intense headache. HOB flat, no use of rails.    Transfers Overall transfer level: Needs assistance Equipment used: Rolling walker (2 wheels) Transfers: Sit to/from Stand Sit to Stand: Min guard           General transfer comment: Min guard for safety. Stood from EOB x2, dizziness reported. From chair x1.    Ambulation/Gait Ambulation/Gait assistance: Mod assist, Min assist Gait Distance (Feet): 8 Feet Assistive device: Rolling walker (2 wheels) Gait Pattern/deviations: Step-through pattern, Decreased stride length Gait velocity: decreased Gait velocity interpretation: <1.31 ft/sec, indicative of household ambulator   General Gait Details: After 8', pt's LEs begin to buckle and pt with increased sway needing to be lowered down into chair without warning. Reports dizziness, nausea and headache.Requests to return to supine.   Stairs             Wheelchair Mobility    Modified Rankin (Stroke Patients Only)       Balance Overall balance assessment: Needs assistance Sitting-balance support: Feet supported, No upper extremity supported Sitting balance-Leahy Scale: Fair Sitting balance - Comments: Limited sitting tolerance due to headache   Standing balance support: During functional activity, Reliant on assistive device for balance Standing balance-Leahy Scale: Poor Standing  balance comment: Requires UE support for dynamic tasks.                            Cognition  Arousal/Alertness: Awake/alert Behavior During Therapy: WFL for tasks assessed/performed Overall Cognitive Status: No family/caregiver present to determine baseline cognitive functioning Area of Impairment: Safety/judgement                         Safety/Judgement: Decreased awareness of safety, Decreased awareness of deficits     General Comments: Decreased awareness of safety. Not telling therapist when he feels like he needs to sit due to worsening dizziness/presyncope. Distracted by headache/nausea.        Exercises      General Comments        Pertinent Vitals/Pain Pain Assessment Pain Assessment: 0-10 Pain Score: 9  Pain Location: headache Pain Descriptors / Indicators: Headache, Grimacing, Discomfort Pain Intervention(s): Monitored during session, Limited activity within patient's tolerance, Repositioned    Home Living                          Prior Function            PT Goals (current goals can now be found in the care plan section) Progress towards PT goals: Not progressing toward goals - comment (due to worsening headache, nausea, legs giving out, dizziness)    Frequency    Min 4X/week      PT Plan Current plan remains appropriate;Discharge plan needs to be updated    Co-evaluation              AM-PAC PT "6 Clicks" Mobility   Outcome Measure  Help needed turning from your back to your side while in a flat bed without using bedrails?: None Help needed moving from lying on your back to sitting on the side of a flat bed without using bedrails?: None Help needed moving to and from a bed to a chair (including a wheelchair)?: A Little Help needed standing up from a chair using your arms (e.g., wheelchair or bedside chair)?: A Little Help needed to walk in hospital room?: A Lot Help needed climbing 3-5 steps with a railing? : A Lot 6 Click Score: 18    End of Session Equipment Utilized During Treatment: Gait belt Activity  Tolerance: Patient limited by pain;Other (comment) (presyncope/weakness) Patient left: in bed;with call bell/phone within reach;with bed alarm set Nurse Communication: Mobility status PT Visit Diagnosis: Unsteadiness on feet (R26.81);Muscle weakness (generalized) (M62.81);Pain Pain - part of body:  (head)     Time: 1030-1047 PT Time Calculation (min) (ACUTE ONLY): 17 min  Charges:  $Therapeutic Activity: 8-22 mins                     Marisa Severin, PT, DPT Acute Rehabilitation Services Secure chat preferred Office [place] 11/13/2021, 11:57 AM

## 2021-11-14 ENCOUNTER — Inpatient Hospital Stay (HOSPITAL_COMMUNITY): Payer: Medicare Other

## 2021-11-14 ENCOUNTER — Encounter (HOSPITAL_COMMUNITY): Payer: Self-pay | Admitting: Internal Medicine

## 2021-11-14 DIAGNOSIS — Z992 Dependence on renal dialysis: Secondary | ICD-10-CM | POA: Diagnosis not present

## 2021-11-14 DIAGNOSIS — N186 End stage renal disease: Secondary | ICD-10-CM | POA: Diagnosis not present

## 2021-11-14 DIAGNOSIS — R569 Unspecified convulsions: Secondary | ICD-10-CM | POA: Diagnosis not present

## 2021-11-14 DIAGNOSIS — S065XAA Traumatic subdural hemorrhage with loss of consciousness status unknown, initial encounter: Secondary | ICD-10-CM | POA: Diagnosis not present

## 2021-11-14 LAB — CBC
HCT: 31 % — ABNORMAL LOW (ref 39.0–52.0)
Hemoglobin: 10.1 g/dL — ABNORMAL LOW (ref 13.0–17.0)
MCH: 30.1 pg (ref 26.0–34.0)
MCHC: 32.6 g/dL (ref 30.0–36.0)
MCV: 92.5 fL (ref 80.0–100.0)
Platelets: 108 10*3/uL — ABNORMAL LOW (ref 150–400)
RBC: 3.35 MIL/uL — ABNORMAL LOW (ref 4.22–5.81)
RDW: 13.5 % (ref 11.5–15.5)
WBC: 7.6 10*3/uL (ref 4.0–10.5)
nRBC: 0 % (ref 0.0–0.2)

## 2021-11-14 LAB — BASIC METABOLIC PANEL
Anion gap: 14 (ref 5–15)
BUN: 38 mg/dL — ABNORMAL HIGH (ref 6–20)
CO2: 26 mmol/L (ref 22–32)
Calcium: 9.6 mg/dL (ref 8.9–10.3)
Chloride: 90 mmol/L — ABNORMAL LOW (ref 98–111)
Creatinine, Ser: 9.05 mg/dL — ABNORMAL HIGH (ref 0.61–1.24)
GFR, Estimated: 6 mL/min — ABNORMAL LOW (ref >60)
Glucose, Bld: 114 mg/dL — ABNORMAL HIGH (ref 70–99)
Potassium: 4.9 mmol/L (ref 3.5–5.1)
Sodium: 130 mmol/L — ABNORMAL LOW (ref 135–145)

## 2021-11-14 LAB — GLUCOSE, CAPILLARY: Glucose-Capillary: 100 mg/dL — ABNORMAL HIGH (ref 70–99)

## 2021-11-14 LAB — PHOSPHORUS: Phosphorus: 5.6 mg/dL — ABNORMAL HIGH (ref 2.5–4.6)

## 2021-11-14 LAB — MAGNESIUM: Magnesium: 2.2 mg/dL (ref 1.7–2.4)

## 2021-11-14 MED ORDER — CLONIDINE HCL 0.1 MG PO TABS
0.2000 mg | ORAL_TABLET | ORAL | Status: DC | PRN
  Administered 2021-11-14 – 2021-11-19 (×3): 0.2 mg via ORAL
  Filled 2021-11-14 (×4): qty 2

## 2021-11-14 MED ORDER — CHLORHEXIDINE GLUCONATE CLOTH 2 % EX PADS: 6.0000 | MEDICATED_PAD | Freq: Every day | CUTANEOUS | Status: DC

## 2021-11-14 MED ORDER — LORAZEPAM 2 MG/ML IJ SOLN
INTRAMUSCULAR | Status: AC
  Administered 2021-11-14: 2 mg
  Filled 2021-11-14: qty 1

## 2021-11-14 MED ORDER — IOHEXOL 350 MG/ML SOLN
100.0000 mL | Freq: Once | INTRAVENOUS | Status: AC | PRN
  Administered 2021-11-14: 100 mL via INTRAVENOUS

## 2021-11-14 MED ORDER — LORAZEPAM 2 MG/ML IJ SOLN: 2.0000 mg | Freq: Once | INTRAMUSCULAR | Status: AC

## 2021-11-14 MED ORDER — LEVETIRACETAM IN NACL 1500 MG/100ML IV SOLN
1500.0000 mg | Freq: Once | INTRAVENOUS | Status: AC
  Administered 2021-11-14: 1500 mg via INTRAVENOUS
  Filled 2021-11-14: qty 100

## 2021-11-14 NOTE — Code Documentation (Signed)
Kirk [name] is a 61 yr old male with a PMH of ESRD on HD and OSA who is inpatient on [address] for evaluation for seizure and rt sided subdural hematoma.  He is not on any blood thinners. He was last known somewhat well albeit confused yesterday. Today upon exam, he exhibited clearcut rt gaze preference and left neglect. Code stroke alert paged.     Pt was met by stroke team in his room on [address]. CBG, VS WNL. He has removed his IV access during his confusion overnight. He has an NIHSS of 12 (please see flowsheet for times and details). Pt with rt gaze, and left neglect.     Pt taken to CT by stroke team and bedside RN. CT with no new hemorrhage (prior SDH present). CTA completed after IV access obtained. Per Dr Quinn Axe, CTA is negative for LVO. Pt will need q 2 hr VS and NIHSS. Bedside handoff with Maggie RN complete. Pt not candidate for thrombolytic as curent SDH and OOW. Pt not eligible for endovascular therapy as LVO negative.

## 2021-11-14 NOTE — Progress Notes (Signed)
Called to pt room by PT/OT - pt with AMS and right gaze and left side weakness.  Code Stroke called - Rapid Reponse RN and neuro team at bedside.  MD notified.  New orders received.

## 2021-11-14 NOTE — Progress Notes (Signed)
Inpatient Rehab Admissions Coordinator:   Per OT recommendation,  patient was screened for CIR candidacy by Clemens Catholic, MS, CCC-SLP. At this time,  PT is recommending HH PT and pt. Has not attempted transfers or gait. I am not able to make an assessment of CIR candidacy at this time but will follow for progress and participation with therapies.  Please contact me any with questions.  Clemens Catholic, [place], [place] Admissions Coordinator  [phone] ([place]) [phone] (office)

## 2021-11-14 NOTE — Progress Notes (Signed)
Physical Therapy Treatment Patient Details Name: Kirk [name] MRN: [id number] DOB: [birth year] Today's Date: 11/14/2021   History of Present Illness Pt is a 61 y.o. male who presented to the ED 10/17 with seizure-like activity. CT revealed R frontal SDH. Near-syncopal-type episode during therapy 10/21, orthostatics stable. PMH:  ESRD on HD, anemia of CKD, carpal tunnel syndrome, chronic headaches, HTN, gout, chronic bilateral upper and lower extremity weakness due to cervical spine pathology (c spine sx 06/2012 and 02/2021), prior renal transplant, OSA on CPAP and secondary hyperparathyroidism    PT Comments    Patient not progressing towards PT goals today. Pt is known to this PT and noted to have new deficits- left inattention vs neglect, slow processing, right gaze preference and difficulty following simple commands despite increased time/multimodal cues. Unable to gaze towards left. Code stroke called. Requires Min A and tactile cues to get to EOB today. Able to return to supine without assist. Pending new deficits and progress next session, may need to update disposition. Will follow.   Recommendations for follow up therapy are one component of a multi-disciplinary discharge planning process, led by the attending physician.  Recommendations may be updated based on patient status, additional functional criteria and insurance authorization.  Follow Up Recommendations  Home health PT (pending new deficits)     Assistance Recommended at Discharge Frequent or constant Supervision/Assistance  Patient can return home with the following A little help with bathing/dressing/bathroom;Assistance with cooking/housework;Assist for transportation;Help with stairs or ramp for entrance;Two people to help with walking and/or transfers   Equipment Recommendations  Rolling walker (2 wheels);BSC/3in1    Recommendations for Other Services       Precautions / Restrictions Precautions Precautions:  Fall Precaution Comments: syncopal type symptoms 10/23, code stroke 10/ 24 Restrictions Weight Bearing Restrictions: No     Mobility  Bed Mobility Overal bed mobility: Needs Assistance Bed Mobility: Sidelying to Sit   Sidelying to sit: Min assist, HOB elevated       General bed mobility comments: Max cues to get to EOB with repetition and tactile assist. Appearing neurologically different than previous sessions.    Transfers                   General transfer comment: Deferred due to code stroke initiated.    Ambulation/Gait                   Stairs             Wheelchair Mobility    Modified Rankin (Stroke Patients Only)       Balance Overall balance assessment: Needs assistance Sitting-balance support: Feet supported, No upper extremity supported Sitting balance-Leahy Scale: Fair Sitting balance - Comments: limited sittting balance due to wanting to lay down vs poor balance. truncal sway noted.                                    Cognition Arousal/Alertness: Lethargic Behavior During Therapy: Flat affect Overall Cognitive Status: No family/caregiver present to determine baseline cognitive functioning Area of Impairment: Following commands, Safety/judgement, Awareness, Problem solving, Attention                   Current Attention Level: Focused   Following Commands: Follows one step commands inconsistently Safety/Judgement: Decreased awareness of safety, Decreased awareness of deficits Awareness: Intellectual Problem Solving: Slow processing, Decreased initiation General Comments: Pt less verbal today  on arrival. Pt presented with new L significant inattn vs. field cut unable to track or tend to L environment or body.  Pt could move L side with normal movements but did not move to command. R gaze preference and mildly confused. PT/OT cotx chose to initiate code stroke and nurse was made aware. Pt left in room with  nursing and neuro PA.        Exercises      General Comments General comments (skin integrity, edema, etc.): Noted to have new left inattentio vs neglect and left hemibody neglect as well as slower processing, right gaze preference. RN notified. Code stroke initiated.      Pertinent Vitals/Pain Pain Assessment Pain Assessment: No/denies pain    Home Living                          Prior Function            PT Goals (current goals can now be found in the care plan section) Progress towards PT goals: Not progressing toward goals - comment (due to new stroke like symptoms)    Frequency    Min 4X/week      PT Plan Current plan remains appropriate    Co-evaluation PT/OT/SLP Co-Evaluation/Treatment: Yes Reason for Co-Treatment: For patient/therapist safety;To address functional/ADL transfers PT goals addressed during session: Mobility/safety with mobility;Balance OT goals addressed during session: ADL's and self-care      AM-PAC PT "6 Clicks" Mobility   Outcome Measure  Help needed turning from your back to your side while in a flat bed without using bedrails?: None Help needed moving from lying on your back to sitting on the side of a flat bed without using bedrails?: A Little Help needed moving to and from a bed to a chair (including a wheelchair)?: A Little Help needed standing up from a chair using your arms (e.g., wheelchair or bedside chair)?: A Little Help needed to walk in hospital room?: A Lot Help needed climbing 3-5 steps with a railing? : A Lot 6 Click Score: 17    End of Session   Activity Tolerance: Treatment limited secondary to medical complications (Comment) (new stroke like symptoms) Patient left: in bed;with call bell/phone within reach;with bed alarm set (with neuro PA present) Nurse Communication: Mobility status;Other (comment) (code stroke) PT Visit Diagnosis: Unsteadiness on feet (R26.81);Muscle weakness (generalized) (M62.81)      Time: 1610-9604 PT Time Calculation (min) (ACUTE ONLY): 19 min  Charges:  $Therapeutic Activity: 8-22 mins                     Marisa Severin, PT, DPT Acute Rehabilitation Services Secure chat preferred Office [phone]      Kirk [name] 11/14/2021, 9:44 AM

## 2021-11-14 NOTE — Progress Notes (Signed)
MD notified of elevated BP.  PRN hydralazine administered and scheduled every 4 hours.  MD notified.  No new orders at this time - permissive HTN per MD.  Will continue to monitor.

## 2021-11-14 NOTE — Progress Notes (Signed)
Occupational Therapy Treatment Patient Details Name: CAMARA ROSANDER MRN: [id number] DOB: 03-06-1960 Today's Date: 11/14/2021   History of present illness Pt is a 61 y.o. male who presented to the ED 10/17 with seizure-like activity. CT revealed R frontal SDH. Near-syncopal-type episode during therapy 10/21, orthostatics stable. PMH:  ESRD on HD, anemia of CKD, carpal tunnel syndrome, chronic headaches, HTN, gout, chronic bilateral upper and lower extremity weakness due to cervical spine pathology (c spine sx 06/2012 and 02/2021), prior renal transplant, OSA on CPAP and secondary hyperparathyroidism   OT comments  Pt seen today as cotx with OT/PT due to recent cognitive issues overnight and possible syncopal episode yesterday during PT when PT needed to catch pt with LOB.  Pt found with significant L neglect vs.L field cut this am. Pt unable to track to the L and attempted to turn head to look left and could never lay eyes on therapist.  Pt with decreased use of LUE to command but would use for spontaneous movements. Pt with mild dysarthria and less verbal and appeared confused.  Nursing notified and Code Stroke initiated. Pt was CG with all mobility and adls but may need increased assist at dc depending on outcomes. Will reevaluate next session.    Recommendations for follow up therapy are one component of a multi-disciplinary discharge planning process, led by the attending physician.  Recommendations may be updated based on patient status, additional functional criteria and insurance authorization.    Follow Up Recommendations  Acute inpatient rehab (3hours/day)    Assistance Recommended at Discharge Frequent or constant Supervision/Assistance  Patient can return home with the following  A little help with walking and/or transfers;A little help with bathing/dressing/bathroom;Assistance with cooking/housework;Direct supervision/assist for medications management;Direct supervision/assist for  financial management;Assist for transportation;Help with stairs or ramp for entrance   Equipment Recommendations  BSC/3in1    Recommendations for Other Services      Precautions / Restrictions Precautions Precautions: Fall Precaution Comments: syncopal type symptoms 10/23, code stroke 10/ 24 Restrictions Weight Bearing Restrictions: No       Mobility Bed Mobility Overal bed mobility: Needs Assistance Bed Mobility: Supine to Sit, Sit to Supine     Supine to sit: Min assist Sit to supine: Min assist   General bed mobility comments: Pt required increased assist today to get to EOB.  Pt neurologically appearing different than previous sessions.    Transfers                   General transfer comment: Declined transfers when code stroke initiated.     Balance Overall balance assessment: Needs assistance Sitting-balance support: Feet supported, No upper extremity supported Sitting balance-Leahy Scale: Fair Sitting balance - Comments: limited sittting balance due to wanting to lay down vs poor balance.       Standing balance comment: Deferred standing                           ADL either performed or assessed with clinical judgement   ADL Overall ADL's : Needs assistance/impaired                                     Functional mobility during ADLs: Minimal assistance General ADL Comments: Pt came to side of bed eventually after some encouragment provided. pt sat EOB with min assist due to poor balance vs. pt lethargic and  wanting to lay down.  Code stroke called and pt returned to supine with min assist.    Extremity/Trunk Assessment Upper Extremity Assessment Upper Extremity Assessment: LUE deficits/detail LUE Deficits / Details: Decreased AROM at shoulder due to "bad shoulder."  Used L arm with spontaneous movements but not to command. LUE: Shoulder pain with ROM LUE Sensation: WNL   Lower Extremity Assessment Lower Extremity  Assessment: Defer to PT evaluation        Vision   Vision Assessment?: Vision impaired- to be further tested in functional context Additional Comments: Pt could not track to the L during this session and attempted to turn head to see to L and continued to be unable to put eyes on therapist to his L.  Further eval required   Perception     Praxis Praxis Praxis: Not tested    Cognition Arousal/Alertness: Lethargic Behavior During Therapy: Flat affect Overall Cognitive Status: No family/caregiver present to determine baseline cognitive functioning Area of Impairment: Following commands, Safety/judgement, Awareness, Problem solving, Attention                   Current Attention Level: Focused   Following Commands: Follows one step commands inconsistently Safety/Judgement: Decreased awareness of safety, Decreased awareness of deficits Awareness: Intellectual Problem Solving: Slow processing, Decreased initiation General Comments: Pt less verbal today on arrival. Pt presented with new L significant inattn vs. field cut unable to track or tend to L environment or body.  Pt could move L side with normal movements but did not move to command. R gaze preference and mildly confused. PT/OT cotx chose to initiate code stroke and nurse was made aware. Pt left in room with nursing and neuro PA.        Exercises      Shoulder Instructions       General Comments Pt presenting differently than previous sessions with new L neglect visually and L body neglect. Pt not as verbal and appearing confused.  Nursing notified.    Pertinent Vitals/ Pain       Pain Assessment Pain Assessment: No/denies pain Faces Pain Scale: Hurts little more Pain Location: headache Pain Descriptors / Indicators: Headache, Grimacing, Discomfort Pain Intervention(s): Limited activity within patient's tolerance, Monitored during session, Repositioned, Other (comment) (let nurse know he did not feel  right)  Home Living                                          Prior Functioning/Environment              Frequency  Min 2X/week        Progress Toward Goals  OT Goals(current goals can now be found in the care plan section)  Progress towards OT goals: Not progressing toward goals - comment (decline in status this am)  Acute Rehab OT Goals Patient Stated Goal: none stated OT Goal Formulation: With patient Time For Goal Achievement: 11/26/21 Potential to Achieve Goals: Good ADL Goals Pt Will Perform Grooming: Independently;standing Pt Will Transfer to Toilet: with modified independence;ambulating;bedside commode Pt Will Perform Toileting - Clothing Manipulation and hygiene: with modified independence;sit to/from stand Additional ADL Goal #1: Pt will be safe throughout the session with RW use.  Plan Discharge plan needs to be updated    Co-evaluation    PT/OT/SLP Co-Evaluation/Treatment: Yes Reason for Co-Treatment: For patient/therapist safety PT goals addressed during session: Mobility/safety with  mobility OT goals addressed during session: ADL's and self-care      AM-PAC OT "6 Clicks" Daily Activity     Outcome Measure   Help from another person eating meals?: None Help from another person taking care of personal grooming?: A Little Help from another person toileting, which includes using toliet, bedpan, or urinal?: A Lot Help from another person bathing (including washing, rinsing, drying)?: A Lot Help from another person to put on and taking off regular upper body clothing?: A Little Help from another person to put on and taking off regular lower body clothing?: A Lot 6 Click Score: 16    End of Session    OT Visit Diagnosis: Unsteadiness on feet (R26.81);Other abnormalities of gait and mobility (R26.89);Muscle weakness (generalized) (M62.81);Pain;Other symptoms and signs involving cognitive function Pain - part of body:  (head)    Activity Tolerance Patient limited by lethargy   Patient Left in bed;with call bell/phone within reach;with nursing/sitter in room   Nurse Communication Mobility status;Other (comment) (New CVA symptoms)        Time: 8346-2194 OT Time Calculation (min): 13 min  Charges: OT General Charges $OT Visit: 1 Visit   Glenford Peers 11/14/2021, 9:38 AM

## 2021-11-14 NOTE — Progress Notes (Signed)
PROGRESS NOTE    Kirk [name]  CZY:[id number] DOB: 19-Nov-1960 DOA: 11/07/2021  PCP: Sandrea Hughs, NP   Brief Narrative:  This 61 year old male with PMH significant of end-stage renal disease on hemodialysis, Alport syndrome, hypertension, chronic thrombocytopenia, anemia of chronic disease, chronic headaches, hyperlipidemia, OSA on CPAP, pancytopenia, diabetes mellitus type 2, memory impairment, chronic diastolic CHF, chronic bilateral upper extremity and lower extremity weakness since C-spine surgery in February 2023 presented with new onset seizure at home lasting for 2 minutes. On presentation, CT of the head showed subdural hematoma.  Neurology and Neurosurgery were consulted.  He was started on Keppra.  Keppra was subsequently switched to Depakote on 11/11/2021 because of persistent headaches. Later Depakote was discontinued and placed back on Keppra.  10/24: Patient has developed right-sided gaze and left neglect.  Code stroke called.  CT head: No stroke, not a candidate for tPA, LT EEG connected.  Assessment & Plan:   Principal Problem:   SDH (subdural hematoma) (HCC) Active Problems:   Seizure (HCC)   Thrombocytopenia (HCC)   OSA on CPAP   ESRD on dialysis [facility])   Essential hypertension   Alports syndrome  New onset seizures: Possibly due to right subdural hematoma as per neurology. Patient was started on Keppra,  renally dosed. Continue seizure/fall precautions. Keppra was switched to Depakote on 10/21 due to persistent headache after discussion with neurology. Later Neurology states there is a risk of bleeding and increased mortality with Depakote. Patient was placed back on Keppra 10/23, Depakote level subtherapeutic. Depakote discontinued. Continue Keppra renally adjusted dose.  Subdural hematoma: Repeat CT head still showed some subdural hematoma. Neurosurgery recommended conservative management. Patient still complaining about severe headache, throbbing Repeat  CT head on 10/21 showed trace increase in subdural hemorrhage along the right tentorium. Patient was reevaluated by neurosurgery Dr. Kathyrn Sheriff. Continue supportive care, follow-up neurosurgery clinic. Neurology started Lyrica for headaches. He reports headache has improved.  Left Neglect / Right gaze: 10/24: Patient was noted to have right gaze and left neglect.  Code stroke called. CT head : No new hemorrhage, CTA is negative for LVO. Neurology states not a candidate for tPA.  Continue EEG.  End-stage renal disease on hemodialysis: Nephrology following, continue dialysis as per schedule.  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  Essential hypertension: Blood pressure still slightly elevated. Continue amlodipine, Imdur, metoprolol.  OSA on CPAP: Continue CPAP at night.  Anemia of chronic disease: H&H remains stable.  Monitor intermittently.  Chronic thrombocytopenia: Platelets are stable, monitor intermittently.  Hypokalemia: Replaced and resolved.  GERD: Continue pantoprazole.  Hyperlipidemia Continue statins  Diabetes mellitus type 2: Blood sugars are mostly stable. Continue regular insulin sliding scale  Obesity: Diet and exercise discussed in detail. Estimated body mass index is 33.18 kg/m as calculated from the following:   Height as of this encounter: '5\' 7"'$  (1.702 m).   Weight as of this encounter: 96.1 kg.   Chronic diastolic CHF: Volume is managed by dialysis. Daily weight, intake output charting, fluid restriction  Physical deconditioning: He recommended outpatient PT.  DVT prophylaxis: SCDs Code Status: Full code Family Communication:No family at bed side. Disposition Plan:   Status is: Inpatient Remains inpatient appropriate because: Severity of illness.  Not medically clear.   Consultants:  Neurology Neurosurgery Nephrology  Procedures: None  Antimicrobials: None  Subjective: Patient was  seen and examined at bedside. Overnight events noted.   Patient was found to have right sided gaze and left neglect.  Code stroke called. CTA head negative for hemorrhage, prior SDH present. Patient was sedated, was given Ativan for CT head and CTA.    Objective: Vitals:   11/13/21 2341 11/14/21 0419 11/14/21 0737 11/14/21 1130  BP: (!) 168/65 (!) 181/96 (!) 152/75 (!) 190/75  Pulse: (!) 101  81   Resp: '18 18 16 18  '$ Temp: 99 F (37.2 C) 98.4 F (36.9 C) 97.9 F (36.6 C) 98 F (36.7 C)  TempSrc: Oral Oral    SpO2: 98% 94% 93%   Weight:      Height:        Intake/Output Summary (Last 24 hours) at 11/14/2021 1203 Last data filed at 11/14/2021 0901 Gross per 24 hour  Intake 178.12 ml  Output 2900 ml  Net -2721.88 ml   Filed Weights   11/10/21 2033 11/13/21 1501 11/13/21 2059  Weight: 93.4 kg 96.2 kg 96.1 kg   Examination:  General exam: Appears comfortable, chronically ill looking and very deconditioned. Respiratory system: CTA bilaterally, respiratory effort normal, RR 15 Cardiovascular system: S1 & S2 heard, regular rate and rhythm, no murmur. Gastrointestinal system: Abdomen is soft, non tender, non distended, BS+ Central nervous system: Sedated. Not assessed. Extremities: No edema, no cyanosis, no clubbing Skin: No rashes, lesions or ulcers Psychiatry: Not assessed.  Data Reviewed: I have personally reviewed following labs and imaging studies  CBC: Recent Labs  Lab 11/07/21 2144 11/07/21 2150 11/08/21 0415 11/10/21 1246 11/13/21 0435 11/14/21 0550  WBC 4.6  --  4.5 3.9* 5.8 7.6  NEUTROABS 3.6  --   --   --  4.3  --   HGB 10.3* 11.2* 10.1* 9.7* 8.9* 10.1*  HCT 30.0* 33.0* 30.9* 28.1* 26.6* 31.0*  MCV 92.3  --  94.5 91.8 92.0 92.5  PLT 87*  --  90* 79* 89* 308*   Basic Metabolic Panel: Recent Labs  Lab 11/07/21 2144 11/07/21 2150 11/08/21 0415 11/09/21 1231 11/10/21 1246 11/13/21 0435 11/14/21 0550  NA 138 136 138  --  134* 130* 130*  K 3.5 3.5  3.3* 4.2 4.5 4.8 4.9  CL 97* 95* 95*  --  90* 89* 90*  CO2 26  --  25  --  '27 24 26  '$ GLUCOSE 149* 145* 117*  --  120* 98 114*  BUN 40* 36* 41*  --  56* 64* 38*  CREATININE 11.66* 11.80* 12.02*  --  12.63* 13.05* 9.05*  CALCIUM 9.3  --  10.2  --  9.9 9.0 9.6  MG  --   --   --   --   --  2.6* 2.2  PHOS  --   --   --   --  6.4*  --  5.6*   GFR: Estimated Creatinine Clearance: 9.7 mL/min (A) (by C-G formula based on SCr of 9.05 mg/dL (H)). Liver Function Tests: Recent Labs  Lab 11/07/21 2144 11/10/21 1246  AST 11*  --   ALT 11  --   ALKPHOS 55  --   BILITOT 0.7  --   PROT 6.5  --   ALBUMIN 3.2* 3.2*   No results for input(s): "LIPASE", "AMYLASE" in the last 168 hours. No results for input(s): "AMMONIA" in the last 168 hours. Coagulation Profile: No results for input(s): "INR", "PROTIME" in the last 168 hours. Cardiac Enzymes: No results for input(s): "CKTOTAL", "CKMB", "CKMBINDEX", "TROPONINI" in the last 168 hours. BNP (last 3 results) No results for input(s): "PROBNP" in the last 8760 hours. HbA1C: No results for input(s): "  HGBA1C" in the last 72 hours. CBG: Recent Labs  Lab 11/08/21 1832 11/14/21 0836  GLUCAP 143* 100*   Lipid Profile: No results for input(s): "CHOL", "HDL", "LDLCALC", "TRIG", "CHOLHDL", "LDLDIRECT" in the last 72 hours. Thyroid Function Tests: No results for input(s): "TSH", "T4TOTAL", "FREET4", "T3FREE", "THYROIDAB" in the last 72 hours. Anemia Panel: No results for input(s): "VITAMINB12", "FOLATE", "FERRITIN", "TIBC", "IRON", "RETICCTPCT" in the last 72 hours. Sepsis Labs: No results for input(s): "PROCALCITON", "LATICACIDVEN" in the last 168 hours.  No results found for this or any previous visit (from the past 240 hour(s)).   Radiology Studies: CT ANGIO HEAD NECK W WO CM W PERF (CODE STROKE)  Result Date: 11/14/2021 CLINICAL DATA:  61 year old male code stroke presentation this morning. Recent right side subdural hematoma and seizure like  activity. EXAM: CT ANGIOGRAPHY HEAD AND NECK CT PERFUSION BRAIN TECHNIQUE: Multidetector CT imaging of the head and neck was performed using the standard protocol during bolus administration of intravenous contrast. Multiplanar CT image reconstructions and MIPs were obtained to evaluate the vascular anatomy. Carotid stenosis measurements (when applicable) are obtained utilizing NASCET criteria, using the distal internal carotid diameter as the denominator. Multiphase CT imaging of the brain was performed following IV bolus contrast injection. Subsequent parametric perfusion maps were calculated using RAPID software. RADIATION DOSE REDUCTION: This exam was performed according to the departmental dose-optimization program which includes automated exposure control, adjustment of the mA and/or kV according to patient size and/or use of iterative reconstruction technique. CONTRAST:  165m OMNIPAQUE IOHEXOL 350 MG/ML SOLN COMPARISON:  Head CT, 0851 hours today and earlier. Chest CT 08/01/2021 FINDINGS: CT Brain Perfusion Findings: CBF (<30%) Volume: 5 mL, but appears to be extra-axial artifact associated with the known SDH. Perfusion (Tmax>6.0s) volume: 028m no T-max parameter abnormality Mismatch Volume: Not applicable Infarction Location: Not applicable. CTA NECK Skeleton: Absent dentition. Previous lower cervical spine fusion. Cervical spine degeneration elsewhere. No acute osseous abnormality identified. Upper chest: Respiratory motion. Scattered mostly sub solid bilateral peribronchial nodular opacities. These are new from the July chest CT and appear inflammatory. No pleural effusion. No superior mediastinal lymphadenopathy. Other neck: Small volume retained secretions in the pharynx. Otherwise negative. Aortic arch: Calcified aortic atherosclerosis. 3 vessel arch configuration. Right carotid system: Negative brachiocephalic artery. Mild motion artifact and plaque at the right CCA origin without stenosis.  Intermittent plaque before the bifurcation, including combined soft and calcified plaque series 5, image 119, but no stenosis. Mostly calcified plaque at the bifurcation, right ICA origin and bulb without stenosis. Moderate additional calcified plaque of the right ICA at the skull base with up to 50 % stenosis with respect to the distal vessel (series 5, image 72). Left carotid system: Mild left CCA plaque without stenosis. Calcified plaque at the left ICA origin and bulb with up to 50 % stenosis with respect to the distal vessel. Vertebral arteries: Right subclavian artery origin motion artifact and calcified plaque with estimated 70 % stenosis with respect to the distal vessel. See series 9, image 117. But the right vertebral artery origin is normal. Right vertebral artery is patent to the skull base with no significant plaque or stenosis. Contralateral moderate proximal left subclavian artery calcified plaque with no significant stenosis. Normal left vertebral artery origin. Codominant left vertebral artery is patent to the skull base with no significant plaque or stenosis. CTA HEAD Posterior circulation: Left vertebral V4 segment is normal to the vertebrobasilar junction. Right vertebral V4 segment with tandem severe irregularity and tandem stenoses of  3-4 mm in length leading up to the vertebrobasilar junction. See series 12, images 24 and 23. Despite this the right V4 segment remains patent. Patent basilar artery with no significant stenosis. Normal SCA and PCA origins. Bilateral AICA origins are patent and may be dominant. Bilateral PCA branches are within normal limits. Anterior circulation: Both ICA siphons are patent with mild to moderate calcified plaque but only mild bilateral siphon stenosis. Patent carotid termini, MCA and ACA origins. Diminutive or absent anterior communicating artery. Bilateral ACA branches are within normal limits. Left MCA M1 segment and bifurcation are patent without stenosis.  Left MCA branches are within normal limits. Right MCA M1 segment and bifurcation are patent without stenosis. Right MCA branches remain within normal limits, mild branch mass effect related to known broad-based right side subdural hematoma Venous sinuses: Patent. Right SDH displaces the cortical veins away from the inner table of the right skull as expected. No active extravasation into the right subdural hematoma is identified. Anatomic variants: None. Review of the MIP images confirms the above findings IMPRESSION: 1. Negative for large vessel occlusion, and negative CT Perfusion. 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 And other atherosclerosis in the neck with estimated 70% stenosis of the Right Subclavian Artery origin, up to 50% stenosis of the cervical ICAs. 2. Bilateral upper lobe multifocal peribronchial nodular opacities are new since a July chest CT, nonspecific but suspicious for Acute bronchopneumonia. 3. Known Right Side Subdural Hematoma with mild mass effect on the Right MCA branches. 4.  Aortic Atherosclerosis (ICD10-I70.0). Electronically Signed   By: Genevie Ann M.D.   On: 11/14/2021 09:34   CT HEAD CODE STROKE WO CONTRAST  Result Date: 11/14/2021 CLINICAL DATA:  Code stroke.  Right-sided gaze, slurred speech. EXAM: CT HEAD WITHOUT CONTRAST TECHNIQUE: Contiguous axial images were obtained from the base of the skull through the vertex without intravenous contrast. RADIATION DOSE REDUCTION: This exam was performed according to the departmental dose-optimization program which includes automated exposure control, adjustment of the mA and/or kV according to patient size and/or use of iterative reconstruction technique. COMPARISON:  CT head 11/12/2019 FINDINGS: Brain: Again seen is the right cerebral convexity subdural hematoma measuring up to 1.0 cm in maximal thickness in the coronal plane (5-34), not significantly  changed in maximal size but with new hyperdense blood layering dependently within the hematoma suggesting interval bleed. A small amount of subdural blood is also seen layering along the right tentorial leaflet, unchanged. The hematoma exerts mass effect on the underlying brain parenchyma with sulcal effacement and partial effacement of the right lateral ventricle but no midline shift. There is no other acute intracranial hemorrhage. There is no evidence of acute territorial infarct. Gray-white differentiation is preserved. Ventricles are stable in size.  There is no solid mass lesion. Vascular: No hyperdense vessel is seen. Skull: Normal. Negative for fracture or focal lesion. Sinuses/Orbits: The paranasal sinuses are clear. The globes and orbits are unremarkable. Other: None. IMPRESSION: 1. Similar size of the right cerebral convexity subdural hematoma measuring up to 1.0 cm in maximal thickness, but with new hyperdense blood layering dependently suggesting interval bleed. Mild mass effect on the underlying brain parenchyma with no midline shift. 2. No evidence of acute territorial infarct. Findings of the initial noncontrast head CT were discussed with Dr. Quinn Axe at 8:55 a.m. Electronically Signed   By: Valetta Mole M.D.   On: 11/14/2021 09:01    Scheduled Meds:  amLODipine  10 mg Oral Daily   calcitRIOL  1 mcg Oral Q M,W,F-HD   Chlorhexidine Gluconate Cloth  6 each Topical Q0600   cholecalciferol  4,000 Units Oral Daily   doxazosin  4 mg Oral Daily   guaiFENesin  1,200 mg Oral BID   isosorbide mononitrate  30 mg Oral Daily   labetalol  10 mg Intravenous Once   losartan  25 mg Oral Daily   methocarbamol  750 mg Oral QID   metoprolol tartrate  100 mg Oral BID   multivitamin  1 tablet Oral Daily   pantoprazole  40 mg Oral BID AC   pravastatin  40 mg Oral Daily   pregabalin  25 mg Oral Daily   senna-docusate  1 tablet Oral BID   sevelamer carbonate  3,200 mg Oral TID WC   Continuous Infusions:   levETIRAcetam Stopped (11/13/21 2113)   levETIRAcetam Stopped (11/14/21 1104)     LOS: 6 days    Time spent: 50 mins  Khristopher Kapaun, MD [facility]   If 7PM-7AM, please contact night-coverage

## 2021-11-14 NOTE — Progress Notes (Signed)
LTM EEG hooked up and running - no initial skin breakdown - push button tested - neuro notified. [facility] monitoring.  

## 2021-11-14 NOTE — Progress Notes (Addendum)
Pt. Pulled out his IV again tonight. Attempted to restick x 2 patient again, unsuccessfully. Order for IV team placed. IV team made a note that they were unable to stick patient successfully. Dr. Bridgett Larsson paged and made aware that patient has lost IV access and that we are unable to establish new site due to patient's confusion and restlessness, and that patient's BP was 181/96, and only PRN for BP ordered is IV hydralazine.

## 2021-11-14 NOTE — Progress Notes (Addendum)
Neurology Stroke Code & Progress Note  Brief HPI:  61 y.o. 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  Interval history: 10/24 Code stroke activated at 08:30. PT in room with concerns for new onset of right forced gaze deviation, left inattention, confusion.  Per bedside RN, patient had some confusion and hypertension with HD yesterday and ongoing confusion and agitation overnight yelling out for his wife and requiring frequent reorientation. He was sleeping this morning so she was allowing him to rest and PT identified a change in status from previous days and activated a Code Stroke.   Subjective: On evaluation, patient does appear to have a right forced gaze, confusion, and left neglect and was taken for a CTH.  Last known well time is unclear, PT states he was at baseline yesterday around 09:30 AM with therapy.  TNK not given due to recent SDH Patient is not a candidate for VIR due to vessel imaging without LVO.   Patient was given 2 mg IV Ativan for concern for possible unwitnessed seizure with post-ictal state during the code stroke at 09:00. Patient was somnolent following Ativan administration but appeared to be able to minimally cross midline on [facility] eyes evaluation.   Exam: Vitals:   11/14/21 0419 11/14/21 0737  BP: (!) 181/96 (!) 152/75  Pulse:  81  Resp: 18 16  Temp: 98.4 F (36.9 C) 97.9 F (36.6 C)  SpO2: 94% 93%   Gen: Sitting up at bedside with PT assistance initially  Resp: non-labored breathing, no respiratory distress on room air, SpO2 94% Abd: soft, non-tender, non-distended  Neuro: Mental Status: Awake, alert to  self, age, and month.  Patient intermittently follows one-step commands but does appear confused and intermittently has trouble following instruction. He is able to name some objects when in the right visual field but does have some significant errors i.e. he names glove "porcupine" and is unable to name cactus or hammock. Mild aphasia and dysarthria on exam, though patient is edentulous.  Patient does repetitively ask for and call out to his wife.  Cranial Nerves: PERRL, forced right gaze, unable to cross midline, left hemianopsia, no overt facial asymmetry, facial sensation intact, hearing intact, tongue/uvula/soft palate midline, normal sternocleidomastoid and trapezius muscle strength. No evidence of tongue atrophy or fibrillations Motor: Intermittent participation in strength testing.  At best, he is able to elevate bilateral upper extremities antigravity without vertical drift without asymmetry RLE is able to elevate antigravity with minimal vertical drift LLE has temporary antigravity movement with vertical drift to bed.  Sensory: Left-sided sensory neglect present to DDS. He does say "ouch" with application of noxious stimuli throughout.  Coordination: Does not participate  Gait: Deferred  NIHSS @ 09:10:  LOC- 0 Questions- 0  Commands- 1 Gaze- 2  Visual- 2 Facial palsy- 0 Motor left arm- 0 Motor right arm- 0 Motor left leg-2 Motor right leg- 1 Ataxia- 0 Sensory- 0 Language- 1 Dysarthria- 1 Extinction and inattention- 2  Total: 12   Pertinent Labs: CBC    Component Value Date/Time   WBC 7.6 11/14/2021 0550   RBC 3.35 (L) 11/14/2021 0550   HGB 10.1 (L) 11/14/2021 0550  HGB 10.0 (L) 08/24/2021 1005   HCT 31.0 (L) 11/14/2021 0550   PLT 108 (L) 11/14/2021 0550   PLT 86 (L) 08/24/2021 1005   MCV 92.5 11/14/2021 0550   MCH 30.1 11/14/2021 0550   MCHC 32.6 11/14/2021 0550   RDW 13.5 11/14/2021 0550   RDW 16.7 (H) 11/26/2013 0914   LYMPHSABS 0.8 11/13/2021 0435    LYMPHSABS 1.3 11/26/2013 0914   MONOABS 0.5 11/13/2021 0435   EOSABS 0.1 11/13/2021 0435   EOSABS 0.2 11/26/2013 0914   BASOSABS 0.1 11/13/2021 0435   BASOSABS 0.1 11/26/2013 0914   CMP     Component Value Date/Time   NA 130 (L) 11/14/2021 0550   NA 136 11/26/2013 0914   K 4.9 11/14/2021 0550   CL 90 (L) 11/14/2021 0550   CO2 26 11/14/2021 0550   GLUCOSE 114 (H) 11/14/2021 0550   BUN 38 (H) 11/14/2021 0550   BUN 66 (A) 05/25/2015 0000   BUN 66 (A) 05/25/2015 0000   CREATININE 9.05 (H) 11/14/2021 0550   CREATININE 11.65 (HH) 05/16/2021 1611   CREATININE 8.37 (H) 02/03/2018 1523   CALCIUM 9.6 11/14/2021 0550   CALCIUM 8.8 06/08/2015 0000   CALCIUM 7.9 (L) 01/19/2010 1016   PROT 6.5 11/07/2021 2144   PROT 6.7 08/27/2013 1351   ALBUMIN 3.2 (L) 11/10/2021 1246   ALBUMIN 3.6 08/27/2013 1351   AST 11 (L) 11/07/2021 2144   AST 11 (L) 05/16/2021 1611   ALT 11 11/07/2021 2144   ALT 9 05/16/2021 1611   ALKPHOS 55 11/07/2021 2144   BILITOT 0.7 11/07/2021 2144   BILITOT 0.4 05/16/2021 1611   GFRNONAA 6 (L) 11/14/2021 0550   GFRNONAA 5 (L) 05/16/2021 1611   GFRAA 4 (L) 07/15/2019 0704   Lab Results  Component Value Date   CHOL 91 05/25/2021   HDL 31 (L) 05/25/2021   LDLCALC 42 05/25/2021   TRIG 95 05/25/2021   CHOLHDL 2.9 05/25/2021   Lab Results  Component Value Date   HGBA1C 4.8 05/25/2021   Lab Results  Component Value Date   VALPROATE 15 (L) 11/12/2021   Imaging Reviewed: [place] Code stroke 10/24: 1. Similar size of the right cerebral convexity subdural hematoma measuring up to 1.0 cm in maximal thickness, but with new hyperdense blood layering dependently suggesting interval bleed. Mild mass effect on the underlying brain parenchyma with no midline shift. 2. No evidence of acute territorial infarct.  CT angio head and neck + CT perfusion 10/24: 1. Negative for large vessel occlusion, and negative CT Perfusion. 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 And other atherosclerosis in the neck with estimated 70% stenosis of the Right Subclavian Artery origin, up to 50% stenosis of the cervical ICAs. 2. Bilateral upper lobe multifocal peribronchial nodular opacities are new since a July chest CT, nonspecific but suspicious for Acute bronchopneumonia. 3. Known Right Side Subdural Hematoma with mild mass effect on the Right MCA branches. 4.  Aortic Atherosclerosis (ICD10-I70.0).  Assessment: 61 year old dialysis patient with acute right SDH with new onset seizure presenting on 10/17.   Patient with new onset left-sided neglect and right forced gaze on 10/24 with resultant Code Stroke activation. Concern for seizure versus interval SDH bleeding with mild mass effect identified on CTH.   Patient was initially started on keppra then transitioned to depakote bc he was having headache. VPA level subtherapeutic 10/23. Keppra is preferred to VPA in setting of  bleed and head trauma and also with his hx thrombocytopenia. Therefore 10/23 VPA was d/c'd (w/o taper since subtherapeutic) and he was reloaded with and continued on keppra. Lyrica, also an AED, was also added for headache at ESRD dosing.    New onset seizure due to right SDH  Intractable headache   Recommendations: - STAT overnight EEG monitoring with MRI compatible leads  - Loaded with Ativan 2 mg IV once in CT and an additional 1500 mg Keppra IV for a 24 hour total of 40 mg/kg - Continue Keppra 500 mg daily and 250 mg after HD  - Depakote discontinued 10/23 - Increase lyrica to '50mg'$  once daily (ESRD dosing, serves as AED and also for headaches) - Seizure precautions  - 2 mg IV Ativan STAT for seizure activity lasting > 3 minutes and notify neurology  - Will continue to follow  Anibal Henderson, AGACNP-BC [facility] [phone]   Attending Neurohospitalist Addendum Patient seen and examined with  APP/Resident. Agree with the history and physical as documented above. Agree with the plan as documented, which I helped formulate. I have edited the note above to reflect my full findings and recommendations. I have independently reviewed the chart, obtained history, review of systems and examined the patient.I have personally reviewed pertinent head/neck/spine imaging (CT/MRI). Please feel free to call with any questions.  -- Su Monks, MD [facility] [phone]  If 7pm- 7am, please page neurology on call as listed in [place].

## 2021-11-14 NOTE — Progress Notes (Signed)
LTM maint complete - no skin breakdown serviced A2 P7 P3 [facility] monitored, Event button test confirmed by [facility].

## 2021-11-14 NOTE — Progress Notes (Signed)
Nephrology progress note:  Subjective:  last HD on 10/23 with 2.9 kg UF.  lost IV access overnight.  Missed or delayed meds due to HD yesterday.  Sleepy this am but states feels better today.   Review of systems:    Headache is better today  Nausea improved Denies shortness of breath or chest pain   Objective Vital signs in last 24 hours: Vitals:   11/13/21 2203 11/13/21 2341 11/14/21 0419 11/14/21 0737  BP: (!) 165/60 (!) 168/65 (!) 181/96 (!) 152/75  Pulse:  (!) 101  81  Resp:  '18 18 16  '$ Temp:  99 F (37.2 C) 98.4 F (36.9 C) 97.9 F (36.6 C)  TempSrc:  Oral Oral   SpO2:  98% 94% 93%  Weight:      Height:       Weight change:   Intake/Output Summary (Last 24 hours) at 11/14/2021 2353 Last data filed at 11/13/2021 1951 Gross per 24 hour  Intake 225 ml  Output 2900 ml  Net -2675 ml     Dialysis Orders:  [facility] MWF  4 hrs 15 min, BFR 450/AF 1.5  EDW 89.9, 2 K/ 2 Ca, UFP 4   Access: LU AVF  Hep 2500   STOP Heparin (has spontaneous subdural hematoma) Sensipar 60 mg qHD Calcitriol 1.25 mcg PO qHD   Assessment/Plan: New onset seizure -Possibly 2/2 Right SDH per neurology. Neurology following. On depakote. Seizure/fall precautions.  Subdural hematoma - Repeat head CT 10/21 showed maybe increased SDH. Neurosurgery has recommended conservative management with outpatient follow-up with them in clinic  ESRD -  On dialysis MWF.  No Heparin with HD. Hypokalemia - resolved and now on 2K bath here    Hypertension/volume  - optimize volume with HD.  Continue current regimen and started losartan on 10/23.  He missed some meds and others given later on 10/23 due to HD it seems  Anemia of CKD - previously without indication for ESA - now back at 10.1.  follow for ESA needs    Secondary Hyperparathyroidism - on sensipar and calcitriol. Sevelamer with meals.  OSA on CPAP - CPAP at night Chronic thrombocytopenia  - stable  Disposition per primary team    Labs: Basic Metabolic  Panel: Recent Labs  Lab 11/10/21 1246 11/13/21 0435 11/14/21 0550  NA 134* 130* 130*  K 4.5 4.8 4.9  CL 90* 89* 90*  CO2 '27 24 26  '$ GLUCOSE 120* 98 114*  BUN 56* 64* 38*  CREATININE 12.63* 13.05* 9.05*  CALCIUM 9.9 9.0 9.6  PHOS 6.4*  --  5.6*   Liver Function Tests: Recent Labs  Lab 11/07/21 2144 11/10/21 1246  AST 11*  --   ALT 11  --   ALKPHOS 55  --   BILITOT 0.7  --   PROT 6.5  --   ALBUMIN 3.2* 3.2*   No results for input(s): "LIPASE", "AMYLASE" in the last 168 hours. No results for input(s): "AMMONIA" in the last 168 hours. CBC: Recent Labs  Lab 11/07/21 2144 11/07/21 2150 11/08/21 0415 11/10/21 1246 11/13/21 0435 11/14/21 0550  WBC 4.6  --  4.5 3.9* 5.8 7.6  NEUTROABS 3.6  --   --   --  4.3  --   HGB 10.3*   < > 10.1* 9.7* 8.9* 10.1*  HCT 30.0*   < > 30.9* 28.1* 26.6* 31.0*  MCV 92.3  --  94.5 91.8 92.0 92.5  PLT 87*  --  90* 79* 89* 108*   < > =  values in this interval not displayed.   Cardiac Enzymes: No results for input(s): "CKTOTAL", "CKMB", "CKMBINDEX", "TROPONINI" in the last 168 hours. CBG: Recent Labs  Lab 11/08/21 1832  GLUCAP 143*    Iron Studies: No results for input(s): "IRON", "TIBC", "TRANSFERRIN", "FERRITIN" in the last 72 hours. Studies/Results: No results found. Medications: Infusions:  levETIRAcetam 250 mg (11/13/21 2058)   levETIRAcetam       Scheduled Medications:  amLODipine  10 mg Oral Daily   calcitRIOL  1 mcg Oral Q M,W,F-HD   Chlorhexidine Gluconate Cloth  6 each Topical Q0600   cholecalciferol  4,000 Units Oral Daily   doxazosin  4 mg Oral Daily   guaiFENesin  1,200 mg Oral BID   isosorbide mononitrate  30 mg Oral Daily   labetalol  10 mg Intravenous Once   losartan  25 mg Oral Daily   methocarbamol  750 mg Oral QID   metoprolol tartrate  100 mg Oral BID   multivitamin  1 tablet Oral Daily   pantoprazole  40 mg Oral BID AC   pravastatin  40 mg Oral Daily   pregabalin  25 mg Oral Daily   senna-docusate   1 tablet Oral BID   sevelamer carbonate  3,200 mg Oral TID WC    have reviewed scheduled and prn medications.  Physical Exam:    General adult male in bed in no acute distress HEENT normocephalic atraumatic extraocular movements intact sclera anicteric Neck supple trachea midline Lungs clear to auscultation bilaterally normal work of breathing at rest  Heart S1S2 no rub Abdomen soft nontender nondistended Extremities no edema  Psych normal mood and affect Neuro - sleepy but wakes to voice and oriented x 3 for me  Access LUE AVF bruit and thrill    Claudia Desanctis, MD 11/14/2021,7:53 AM

## 2021-11-15 ENCOUNTER — Inpatient Hospital Stay (HOSPITAL_COMMUNITY): Payer: Medicare Other

## 2021-11-15 DIAGNOSIS — D696 Thrombocytopenia, unspecified: Secondary | ICD-10-CM | POA: Diagnosis not present

## 2021-11-15 DIAGNOSIS — N186 End stage renal disease: Secondary | ICD-10-CM | POA: Diagnosis not present

## 2021-11-15 DIAGNOSIS — R509 Fever, unspecified: Secondary | ICD-10-CM | POA: Diagnosis not present

## 2021-11-15 DIAGNOSIS — S065XAA Traumatic subdural hemorrhage with loss of consciousness status unknown, initial encounter: Secondary | ICD-10-CM | POA: Diagnosis not present

## 2021-11-15 DIAGNOSIS — R569 Unspecified convulsions: Secondary | ICD-10-CM | POA: Diagnosis not present

## 2021-11-15 LAB — CBC WITH DIFFERENTIAL/PLATELET
Abs Immature Granulocytes: 0.02 10*3/uL (ref 0.00–0.07)
Basophils Absolute: 0 10*3/uL (ref 0.0–0.1)
Basophils Relative: 1 %
Eosinophils Absolute: 0 10*3/uL (ref 0.0–0.5)
Eosinophils Relative: 0 %
HCT: 29.1 % — ABNORMAL LOW (ref 39.0–52.0)
Hemoglobin: 9.5 g/dL — ABNORMAL LOW (ref 13.0–17.0)
Immature Granulocytes: 0 %
Lymphocytes Relative: 7 %
Lymphs Abs: 0.4 10*3/uL — ABNORMAL LOW (ref 0.7–4.0)
MCH: 30.6 pg (ref 26.0–34.0)
MCHC: 32.6 g/dL (ref 30.0–36.0)
MCV: 93.9 fL (ref 80.0–100.0)
Monocytes Absolute: 0.7 10*3/uL (ref 0.1–1.0)
Monocytes Relative: 12 %
Neutro Abs: 4.5 10*3/uL (ref 1.7–7.7)
Neutrophils Relative %: 80 %
Platelets: 82 10*3/uL — ABNORMAL LOW (ref 150–400)
RBC: 3.1 MIL/uL — ABNORMAL LOW (ref 4.22–5.81)
RDW: 14 % (ref 11.5–15.5)
WBC: 5.6 10*3/uL (ref 4.0–10.5)
nRBC: 0 % (ref 0.0–0.2)

## 2021-11-15 LAB — PHOSPHORUS: Phosphorus: 6.3 mg/dL — ABNORMAL HIGH (ref 2.5–4.6)

## 2021-11-15 LAB — APTT: aPTT: 29 seconds (ref 24–36)

## 2021-11-15 LAB — PROTIME-INR
INR: 1.2 (ref 0.8–1.2)
Prothrombin Time: 15.4 seconds — ABNORMAL HIGH (ref 11.4–15.2)

## 2021-11-15 LAB — LACTIC ACID, PLASMA
Lactic Acid, Venous: 0.9 mmol/L (ref 0.5–1.9)
Lactic Acid, Venous: 0.7 mmol/L (ref 0.5–1.9)

## 2021-11-15 LAB — MAGNESIUM: Magnesium: 2.3 mg/dL (ref 1.7–2.4)

## 2021-11-15 LAB — COMPREHENSIVE METABOLIC PANEL
ALT: 11 U/L (ref 0–44)
AST: 15 U/L (ref 15–41)
Albumin: 3.2 g/dL — ABNORMAL LOW (ref 3.5–5.0)
Alkaline Phosphatase: 63 U/L (ref 38–126)
Anion gap: 15 (ref 5–15)
BUN: 59 mg/dL — ABNORMAL HIGH (ref 6–20)
CO2: 24 mmol/L (ref 22–32)
Calcium: 9.5 mg/dL (ref 8.9–10.3)
Chloride: 91 mmol/L — ABNORMAL LOW (ref 98–111)
Creatinine, Ser: 12.36 mg/dL — ABNORMAL HIGH (ref 0.61–1.24)
GFR, Estimated: 4 mL/min — ABNORMAL LOW (ref >60)
Glucose, Bld: 87 mg/dL (ref 70–99)
Potassium: 4.8 mmol/L (ref 3.5–5.1)
Sodium: 130 mmol/L — ABNORMAL LOW (ref 135–145)
Total Bilirubin: 1.6 mg/dL — ABNORMAL HIGH (ref 0.3–1.2)
Total Protein: 6.5 g/dL (ref 6.5–8.1)

## 2021-11-15 LAB — PROCALCITONIN: Procalcitonin: 0.58 ng/mL

## 2021-11-15 MED ORDER — VANCOMYCIN HCL 750 MG/150ML IV SOLN
750.0000 mg | INTRAVENOUS | Status: DC
  Administered 2021-11-15 – 2021-11-17 (×2): 750 mg via INTRAVENOUS
  Filled 2021-11-15 (×4): qty 150

## 2021-11-15 MED ORDER — SODIUM CHLORIDE 0.9 % IV SOLN
1.0000 g | INTRAVENOUS | Status: AC
  Administered 2021-11-15 – 2021-11-19 (×5): 1 g via INTRAVENOUS
  Filled 2021-11-15 (×6): qty 10

## 2021-11-15 MED ORDER — AMLODIPINE BESYLATE 10 MG PO TABS: 10.0000 mg | ORAL_TABLET | Freq: Every evening | ORAL | Status: DC

## 2021-11-15 MED ORDER — SODIUM CHLORIDE 0.9 % IV SOLN
1.0000 g | INTRAVENOUS | Status: DC
  Filled 2021-11-15: qty 10

## 2021-11-15 MED ORDER — METRONIDAZOLE 500 MG/100ML IV SOLN
500.0000 mg | Freq: Two times a day (BID) | INTRAVENOUS | Status: DC
  Administered 2021-11-15 – 2021-11-18 (×7): 500 mg via INTRAVENOUS
  Filled 2021-11-15 (×7): qty 100

## 2021-11-15 MED ORDER — SODIUM CHLORIDE 0.9 % IV SOLN
2.0000 g | Freq: Once | INTRAVENOUS | Status: AC
  Administered 2021-11-15: 2 g via INTRAVENOUS
  Filled 2021-11-15: qty 12.5

## 2021-11-15 MED ORDER — DILTIAZEM HCL 25 MG/5ML IV SOLN
10.0000 mg | Freq: Once | INTRAVENOUS | Status: AC
  Administered 2021-11-15: 10 mg via INTRAVENOUS
  Filled 2021-11-15: qty 5

## 2021-11-15 MED ORDER — LABETALOL HCL 5 MG/ML IV SOLN
10.0000 mg | INTRAVENOUS | Status: DC | PRN
  Administered 2021-11-17: 10 mg via INTRAVENOUS
  Filled 2021-11-15: qty 4

## 2021-11-15 MED ORDER — DILTIAZEM HCL-DEXTROSE 125-5 MG/125ML-% IV SOLN (PREMIX)
5.0000 mg/h | INTRAVENOUS | Status: DC
  Administered 2021-11-15 (×2): 5 mg/h via INTRAVENOUS
  Filled 2021-11-15: qty 125

## 2021-11-15 MED ORDER — VANCOMYCIN HCL 1500 MG/300ML IV SOLN
1500.0000 mg | Freq: Once | INTRAVENOUS | Status: AC
  Administered 2021-11-15: 1500 mg via INTRAVENOUS
  Filled 2021-11-15: qty 300

## 2021-11-15 NOTE — Progress Notes (Signed)
Overnight progress note  Notified by RN that patient is now febrile and slightly tachycardic.  Temperature 100 F > 100.9 F on repeat check, pulse 103, blood pressure 164/65.  No tachypnea or increasing oxygen requirement.  -Give broad-spectrum antibiotics at this time including vancomycin, cefepime, and Flagyl. -Avoiding fluid boluses as blood pressure stable and he is end-stage renal disease -Sepsis work-up ordered: Stat CBC, CMP, PT/INR, aPTT, lactate, procalcitonin, blood cultures, chest x-ray

## 2021-11-15 NOTE — Progress Notes (Signed)
Physical Therapy Treatment Patient Details Name: Kirk [name] MRN: [id number] DOB: January 20, 1961 Today's Date: 11/15/2021   History of Present Illness Pt is a 61 y.o. male who presented to the ED 10/17 with seizure-like activity. CT revealed R frontal SDH. Near-syncopal-type episode during therapy 10/21, orthostatics stable. 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  Patient also just s/p HD so set room up to allow OOB to chair as pt eager to get his meal tray and pt moving impulsively to chair with S to minguard A.  C/o nausea after transition so obtained apple sauce and peaches as he awaited his meal.  RN outside the room and chair alarm set for safety.  Feel he should progress and hopeful for home when stable with follow up HHPT.  PT will continue to follow and progress mobility as tolerated.   Recommendations for follow up therapy are one component of a multi-disciplinary discharge planning process, led by the attending physician.  Recommendations may be updated based on patient status, additional functional criteria and insurance authorization.  Follow Up Recommendations  Home health PT     Assistance Recommended at Discharge Frequent or constant Supervision/Assistance  Patient can return home with the following A little help with bathing/dressing/bathroom;Assistance with cooking/housework;Assist for transportation;Help with stairs or ramp for entrance;Two people to help with walking and/or transfers   Equipment Recommendations  Rolling walker (2 wheels);BSC/3in1    Recommendations for Other Services       Precautions /  Restrictions Precautions Precautions: Fall Precaution Comments: watch vitals     Mobility  Bed Mobility   Bed Mobility: Supine to Sit     Supine to sit: Min guard     General bed mobility comments: assist for safety with multiple lines    Transfers   Equipment used: None Transfers: Sit to/from Stand, Bed to chair/wheelchair/BSC Sit to Stand: Supervision Stand pivot transfers: Min guard         General transfer comment: assist for safety due to lines and pt standing prior to PT getting walker    Ambulation/Gait               General Gait Details: NT as pt s/p HD and eager to eat and time spent setting up room for safety with lines and chair and EKG tech entering room.  C/o nausea after up in chair   Stairs             Wheelchair Mobility    Modified Rankin (Stroke Patients Only) Modified Rankin (Stroke Patients Only) Pre-Morbid Rankin Score: Slight disability Modified Rankin: Moderately severe disability     Balance Overall balance assessment: Needs assistance   Sitting balance-Leahy Scale: Good       Standing balance-Leahy Scale: Poor                              Cognition Arousal/Alertness: Awake/alert Behavior During Therapy: Impulsive                       Current Attention Level: Selective   Following  Commands: Follows one step commands consistently, Follows one step commands with increased time Safety/Judgement: Decreased awareness of safety     General Comments: talking and joking with wife and RN in the room.  Difficult to fully assess but impulsively stood to transfer to chair while PT stepping to get RW        Exercises      General Comments General comments (skin integrity, edema, etc.): VSS though pt nauseated in chair, RN outside room and brought apple sauce and peaches for pt while waiting for his meal tray      Pertinent Vitals/Pain Pain Assessment Pain Assessment: No/denies pain    Home  Living                          Prior Function            PT Goals (current goals can now be found in the care plan section) Progress towards PT goals: Progressing toward goals    Frequency    Min 4X/week      PT Plan Current plan remains appropriate    Co-evaluation              AM-PAC PT "6 Clicks" Mobility   Outcome Measure  Help needed turning from your back to your side while in a flat bed without using bedrails?: A Little Help needed moving from lying on your back to sitting on the side of a flat bed without using bedrails?: A Little Help needed moving to and from a bed to a chair (including a wheelchair)?: A Little Help needed standing up from a chair using your arms (e.g., wheelchair or bedside chair)?: A Little Help needed to walk in hospital room?: Total Help needed climbing 3-5 steps with a railing? : Total 6 Click Score: 14    End of Session Equipment Utilized During Treatment: Gait belt Activity Tolerance: Patient limited by fatigue Patient left: in chair;with call bell/phone within reach;with chair alarm set Nurse Communication: Mobility status PT Visit Diagnosis: Muscle weakness (generalized) (M62.81);Other abnormalities of gait and mobility (R26.89)     Time: 3557-3220 PT Time Calculation (min) (ACUTE ONLY): 27 min  Charges:  $Therapeutic Activity: 23-37 mins                     Kirk [name], PT Acute Rehabilitation Services Office:[phone] 11/15/2021    Kirk [name] 11/15/2021, 6:36 PM

## 2021-11-15 NOTE — Sepsis Progress Note (Signed)
[facility] monitoring for the code sepsis protocol.  

## 2021-11-15 NOTE — Progress Notes (Signed)
vLTM maintenance  All impedances below 10kohms.  No skin breakdown noted at [place] FP2  F7  F8

## 2021-11-15 NOTE — Procedures (Signed)
Seen and examined on dialysis.  Blood pressure 184/67 and HR 88.  Tolerating goal.  Procedure supervised.  Left AVF in use.    Claudia Desanctis, MD 11/15/2021  10:48 AM

## 2021-11-15 NOTE — Progress Notes (Signed)
Pharmacy Antibiotic Note  Kirk [name] is a 61 y.o. male with fevers and possible sepsis.  Pharmacy has been consulted for Vancomycin and Cefepime  dosing.  Plan: Vancomycin 1500 mg IV now, then 750 mg IV after each HD Cefepime 2 g IV now, then 1 g IV q24h  Height: '5\' 7"'$  (170.2 cm) Weight: 96.1 kg (211 lb 13.8 oz) IBW/kg (Calculated) : 66.1  Temp (24hrs), Avg:98.9 F (37.2 C), Min:97.9 F (36.6 C), Max:100.9 F (38.3 C)  Recent Labs  Lab 11/08/21 0415 11/10/21 1246 11/13/21 0435 11/14/21 0550  WBC 4.5 3.9* 5.8 7.6  CREATININE 12.02* 12.63* 13.05* 9.05*    Estimated Creatinine Clearance: 9.7 mL/min (A) (by C-G formula based on SCr of 9.05 mg/dL (H)).    Allergies  Allergen Reactions   Codeine Nausea And Vomiting   Hydrocodone-Acetaminophen Nausea And Vomiting     Caryl Pina 11/15/2021 1:03 AM

## 2021-11-15 NOTE — Progress Notes (Addendum)
Neurology Progress Note  Brief HPI:  61 y.o. 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  Subjective: No family at the bedside. No new neurological events overnight. Code stroke activated yesterday for R gaze deviation, confusion, L neglect and was favored to be 2/2 seizure. Today patient is awake and alert in bed in NAD with LTM running. Patient is requesting food and water. Patient states he does not remember events from yesterday. He tells me that his body does not feel right and he feels off. He answers all questions appropriately, with delayed responses. Follows commands, moving all extremities. He is getting ready to go to HD.   Exam: Vitals:   11/15/21 0900 11/15/21 0930  BP: (!) 170/65 (!) 169/70  Pulse: 86 88  Resp: 14 14  Temp:    SpO2: 94% 93%   Gen: laying in bed in NAD  Resp: non-labored breathing, no respiratory distress on room air Abd: soft, non-tender, non-distended  Neuro: Mental Status: Awake, alert to self, age, and month and place, city.  His responses and movements are quite delayed compared to 2 days ago. Mild dysarthria noted  Cranial Nerves: PERRL, eyes are midline, follows examiner.  no facial asymmetry, facial sensation intact, hearing intact, tongue/uvula/soft palate midline, normal sternocleidomastoid and trapezius muscle strength. No evidence of tongue atrophy or fibrillations Motor: He is able to move all extremities spontaneously and are antigravity, bilateral legs are antigravity with a drift  Sensory: Responds to noxious stimuli  Coordination: no ataxia  Gait: Deferred  Pertinent Labs: CBC    Component  Value Date/Time   WBC 5.6 11/15/2021 0302   RBC 3.10 (L) 11/15/2021 0302   HGB 9.5 (L) 11/15/2021 0302   HGB 10.0 (L) 08/24/2021 1005   HCT 29.1 (L) 11/15/2021 0302   PLT 82 (L) 11/15/2021 0302   PLT 86 (L) 08/24/2021 1005   MCV 93.9 11/15/2021 0302   MCH 30.6 11/15/2021 0302   MCHC 32.6 11/15/2021 0302   RDW 14.0 11/15/2021 0302   RDW 16.7 (H) 11/26/2013 0914   LYMPHSABS 0.4 (L) 11/15/2021 0302   LYMPHSABS 1.3 11/26/2013 0914   MONOABS 0.7 11/15/2021 0302   EOSABS 0.0 11/15/2021 0302   EOSABS 0.2 11/26/2013 0914   BASOSABS 0.0 11/15/2021 0302   BASOSABS 0.1 11/26/2013 0914   CMP     Component Value Date/Time   NA 130 (L) 11/15/2021 0302   NA 136 11/26/2013 0914   K 4.8 11/15/2021 0302   CL 91 (L) 11/15/2021 0302   CO2 24 11/15/2021 0302   GLUCOSE 87 11/15/2021 0302   BUN 59 (H) 11/15/2021 0302   BUN 66 (A) 05/25/2015 0000   BUN 66 (A) 05/25/2015 0000   CREATININE 12.36 (H) 11/15/2021 0302   CREATININE 11.65 (HH) 05/16/2021 1611   CREATININE 8.37 (H) 02/03/2018 1523   CALCIUM 9.5 11/15/2021 0302   CALCIUM 8.8 06/08/2015 0000   CALCIUM 7.9 (L) 01/19/2010 1016   PROT 6.5 11/15/2021 0302   PROT 6.7 08/27/2013 1351   ALBUMIN 3.2 (L) 11/15/2021 0302   ALBUMIN 3.6 08/27/2013 1351   AST 15 11/15/2021 0302   AST 11 (L) 05/16/2021 1611   ALT  11 11/15/2021 0302   ALT 9 05/16/2021 1611   ALKPHOS 63 11/15/2021 0302   BILITOT 1.6 (H) 11/15/2021 0302   BILITOT 0.4 05/16/2021 1611   GFRNONAA 4 (L) 11/15/2021 0302   GFRNONAA 5 (L) 05/16/2021 1611   GFRAA 4 (L) 07/15/2019 0704   Lab Results  Component Value Date   CHOL 91 05/25/2021   HDL 31 (L) 05/25/2021   LDLCALC 42 05/25/2021   TRIG 95 05/25/2021   CHOLHDL 2.9 05/25/2021   Lab Results  Component Value Date   HGBA1C 4.8 05/25/2021   Lab Results  Component Value Date   VALPROATE 15 (L) 11/12/2021   Imaging Reviewed: [facility] Code stroke 10/24: 1. Similar size of the right cerebral convexity subdural hematoma  measuring up to 1.0 cm in maximal thickness, but with new hyperdense blood layering dependently suggesting interval bleed. Mild mass effect on the underlying brain parenchyma with no midline shift. 2. No evidence of acute territorial infarct.  CT angio head and neck + CT perfusion 10/24: 1. Negative for large vessel occlusion, and negative CT Perfusion. 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 And other atherosclerosis in the neck with estimated 70% stenosis of the Right Subclavian Artery origin, up to 50% stenosis of the cervical ICAs. 2. Bilateral upper lobe multifocal peribronchial nodular opacities are new since a July chest CT, nonspecific but suspicious for Acute bronchopneumonia. 3. Known Right Side Subdural Hematoma with mild mass effect on the Right MCA branches. 4.  Aortic Atherosclerosis (ICD10-I70.0).  LTM EEG 10/24-10/25 This study was initially suggestive of cortical dysfunction arising from right hemisphere likely secondary to postictal state as well as moderate to severe diffuse encephalopathy.  Gradually, EEG improved and was suggestive of mild to moderate diffuse encephalopathy, nonspecific etiology.  No seizures or epileptiform discharges were seen throughout the recording.  Assessment: 61 year old dialysis patient with acute right SDH with new onset seizure presenting on 10/17.   Patient with new onset left-sided neglect and right forced gaze on 10/24 with resultant Code Stroke activation. Concern for seizure versus interval SDH bleeding with mild mass effect identified on CTH.   Patient was initially started on keppra then transitioned to depakote bc he was having headache. VPA level subtherapeutic 10/23. Keppra is preferred to VPA in setting of bleed and head trauma and also with his hx thrombocytopenia. Therefore 10/23 VPA was d/c'd (w/o taper since subtherapeutic) and he was reloaded with and  continued on keppra. Lyrica, also an AED, was also added for headache at ESRD dosing.    New onset seizure due to right SDH  Intractable headache   Recommendations: - Discontinue LTM EEG monitoring  - MRI brain without contrast after HD - Continue Keppra 500 mg daily and 250 mg after HD  - Continue Lyrica to '50mg'$  once daily (ESRD dosing, serves as AED and also for headaches) - Seizure precautions  - 2 mg IV Ativan STAT for seizure activity lasting > 3 minutes and notify neurology  - Will continue to follow  Beulah Gandy DNP, [place]   Attending Neurohospitalist Addendum Patient seen and examined with APP/Resident. Agree with the history and physical as documented above. Agree with the plan as documented, which I helped formulate. I have edited the note above to reflect my full findings and recommendations. I have independently reviewed the chart, obtained history, review of systems and examined the patient.I have personally reviewed pertinent head/neck/spine imaging (CT/MRI). Please feel  free to call with any questions.  -- Su Monks, MD [facility] [phone]  If 7pm- 7am, please page neurology on call as listed in [place].

## 2021-11-15 NOTE — Procedures (Signed)
Patient Name: Kirk [name]  MRN: [id number]  Epilepsy Attending: [name]  Referring Physician/Provider: Rikki Spearing, NP  Duration: 11/14/2021 1034 to 11/15/2021 0758   Patient history: 60yo M with new onset seizures. EEG to evaluate for seizure.    Level of alertness: Awake, asleep   AEDs during EEG study: LEV   Technical aspects: This EEG study was done with scalp electrodes positioned according to the 10-20 International system of electrode placement. Electrical activity was reviewed with band pass filter of 1-'70Hz'$ , sensitivity of 7 uV/mm, display speed of 41m/sec with a '60Hz'$  notched filter applied as appropriate. EEG data were recorded continuously and digitally stored.  Video monitoring was available and reviewed as appropriate.   Description: At the beginning of the study, EEG showed continuous generalized and lateralized right hemisphere 3-6 theta-delta slowing.  Gradually, EEG improved and showed  posterior dominant rhythm of 8-'9Hz'$  activity of moderate voltage (25-35 uV) seen predominantly in posterior head regions, symmetric and reactive to eye opening and eye closing. Sleep was characterized by vertex waves, sleep spindles (12 to 14 Hz), maximal frontocentral region.  EEG also showed intermittent generalized and lateralized right hemisphere 3 to 6 Hz theta-delta slowing. Hyperventilation and photic stimulation were not performed.      ABNORMALITY - Continuous slow, generalized and lateralized right hemisphere   IMPRESSION: This study was initially suggestive of cortical dysfunction arising from right hemisphere likely secondary to postictal state as well as moderate to severe diffuse encephalopathy.  Gradually, EEG improved and was suggestive of mild to moderate diffuse encephalopathy, nonspecific etiology.  No seizures or epileptiform discharges were seen throughout the recording.   Please note lack of epileptiform activity does not exclude the diagnosis of  epilepsy.     Waniya Hoglund OBarbra Sarks

## 2021-11-15 NOTE — Progress Notes (Signed)
Paged physician due to MEWS score and presentation. Rapid response nurse also notified. RR RN states will come evaluate.

## 2021-11-15 NOTE — Progress Notes (Signed)
   11/15/21 1245  Vitals  Temp 97.7 F (36.5 C)  BP (!) 147/85  Pulse Rate 85  Resp 18  Oxygen Therapy  SpO2 95 %  O2 Device Room Air  Patient Activity (if Appropriate) In bed  Pulse Oximetry Type Continuous   Received patient in bed to unit.  Alert and oriented.  Informed consent signed and in chart.   Treatment initiated: [facility] [place] Treatment completed: 1245p  Patient tolerated well.  Transported back to the room  Alert, without acute distress.  Hand-off given to patient's nurse.   Access used: Yes Access issues: No  Total UF removed: 3500 Medication(s) given: None Post HD VS: 97.7, 147/85 Post HD weight: 90.7kg   [facility]

## 2021-11-15 NOTE — Progress Notes (Signed)
vLTM discontinued.  [facility] notified.  No skin breakdown noted at all skin sites. ?

## 2021-11-15 NOTE — Progress Notes (Addendum)
[facility] PROGRESS NOTE   Kirk [name] ENM:[id number] DOB: July 08, 1960 DOA: 11/07/2021  PCP: Sandrea Hughs, NP  Brief History/Interval Summary: 61 year old male with PMH significant of end-stage renal disease on hemodialysis, Alport syndrome, hypertension, chronic thrombocytopenia, anemia of chronic disease, chronic headaches, hyperlipidemia, OSA on CPAP, pancytopenia, diabetes mellitus type 2, memory impairment, chronic diastolic CHF, chronic bilateral upper extremity and lower extremity weakness since C-spine surgery in February 2023 presented with new onset seizure at home lasting for 2 minutes. On presentation, CT of the head showed subdural hematoma.  Neurology and Neurosurgery were consulted.  He was started on Keppra.  Keppra was subsequently switched to Depakote on 11/11/2021 because of persistent headaches. Later Depakote was discontinued and placed back on Keppra.  10/24: Patient has developed right-sided gaze and left neglect.  Code stroke called.  CT head: No stroke, not a candidate for tPA, LT EEG connected.  Consultants: Neurology  Procedures: EEG with long-term monitoring    Subjective/Interval History: Patient seen while at dialysis.  Slow to respond to questions but does answer appropriately.  Denies any headaches.  No chest pain or shortness of breath.    Assessment/Plan:  Fever and tachycardia Noted to be febrile to 100.9 F earlier this morning.  Concern for SIRS.  No clear source identified. Work-up was initiated.  Patient placed on vancomycin cefepime and Flagyl.  Due to ongoing concern for seizure activity will discontinue cefepime and initiate ceftriaxone. Lactic acid level normal.  Procalcitonin 0.58.  WBC is normal. Chest x-ray noted to be abnormal but more suggestive of interstitial edema rather than infectious infiltrates.  No cough mentioned by patient. No evidence for fever subsequently.  Follow-up on blood cultures.  Atrial Fibrillation with  RVR Patient returned from dialysis and was noted to be tachycardic.  EKG shows atrial fibrillation with RVR.  Patient was given a dose of Cardizem.  He had not received his metoprolol over the last 36 hours due to alteration in mental status and inability to take orally.  He was given his dose of metoprolol as well.  He has now converted back to sinus rhythm.  Atrial fibrillation likely triggered by volume that was followed by dialysis along with the fact that he was not getting his beta-blocker.  No previous history of atrial fibrillation noted.  Not a candidate for anticoagulation due to subdural hematoma.  New onset seizures: Possibly due to right subdural hematoma as per neurology. Patient was started on Keppra,  renally dosed. Continue seizure/fall precautions. Keppra was switched to Depakote on 10/21 due to persistent headache after discussion with neurology. Later Neurology states there is a risk of bleeding and increased mortality with Depakote. Patient was placed back on Keppra 10/23, Depakote level subtherapeutic. Depakote discontinued. Now patient is only on Keppra.  Management per neurology.   Subdural hematoma: Repeat CT head still showed some subdural hematoma. Neurosurgery recommended conservative management. Patient still complaining about severe headache, throbbing Repeat CT head on 10/21 showed trace increase in subdural hemorrhage along the right tentorium. Patient was reevaluated by neurosurgery Dr. Kathyrn Sheriff. Continue supportive care, follow-up neurosurgery clinic. Neurology started Lyrica for headaches. He reports headache has improved.   Left Neglect / Right gaze: 10/24: Patient was noted to have right gaze and left neglect.  Code stroke called. CT head : No new hemorrhage, CTA is negative for LVO. Likely secondary to seizure activity.   End-stage renal disease on hemodialysis: Nephrology following, continue dialysis as per schedule.  Currently on a Monday Wednesday  Friday schedule.   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   Essential hypertension: Blood pressure still slightly elevated. Continue amlodipine, Imdur, metoprolol.  Will not be too aggressive in treating his high blood pressure due to dialysis.   OSA on CPAP: Continue CPAP at night.   Anemia of chronic disease: H&H remains stable.  Monitor intermittently.   Chronic thrombocytopenia: Platelets are stable, monitor intermittently.   Hypokalemia: Replaced and resolved.   GERD: Continue pantoprazole.   Hyperlipidemia Continue statins   Diabetes mellitus type 2: Blood sugars are mostly stable. Continue regular insulin sliding scale   Obesity: Diet and exercise discussed in detail. Estimated body mass index is 33.18 kg/m as calculated from the following:   Height as of this encounter: '5\' 7"'$  (1.702 m).   Weight as of this encounter: 96.1 kg.    Chronic diastolic CHF: Volume is managed by dialysis. Daily weight, intake output charting, fluid restriction   Physical deconditioning: He recommended outpatient PT.  DVT Prophylaxis: SCDs Code Status: Full code Family Communication: Discussed with patient.  No family at bedside Disposition Plan: Will need to be reevaluated by physical therapy  Status is: Inpatient Remains inpatient appropriate because: Seizure activity      Medications: Scheduled:  amLODipine  10 mg Oral QPM   calcitRIOL  1 mcg Oral Q M,W,F-HD   Chlorhexidine Gluconate Cloth  6 each Topical Q0600   cholecalciferol  4,000 Units Oral Daily   doxazosin  4 mg Oral Daily   guaiFENesin  1,200 mg Oral BID   isosorbide mononitrate  30 mg Oral Daily   labetalol  10 mg Intravenous Once   losartan  25 mg Oral Daily   methocarbamol  750 mg Oral QID   metoprolol tartrate  100 mg Oral BID   multivitamin  1 tablet Oral Daily   pantoprazole  40 mg Oral BID AC   pravastatin  40 mg Oral Daily   pregabalin  25 mg Oral Daily    senna-docusate  1 tablet Oral BID   sevelamer carbonate  3,200 mg Oral TID WC   Continuous:  ceFEPime (MAXIPIME) IV     levETIRAcetam Stopped (11/13/21 2113)   levETIRAcetam Stopped (11/14/21 1104)   metronidazole 500 mg (11/15/21 0359)   vancomycin     OZD:[id number] **OR** acetaminophen, bisacodyl, cloNIDine, hydrALAZINE, HYDROmorphone (DILAUDID) injection, metoCLOPramide (REGLAN) injection, nitroGLYCERIN, ondansetron **OR** ondansetron (ZOFRAN) IV, mouth rinse, oxyCODONE, polyethylene glycol, sevelamer carbonate, traZODone  Antibiotics: Anti-infectives (From admission, onward)    Start     Dose/Rate Route Frequency Ordered Stop   11/15/21 1800  ceFEPIme (MAXIPIME) 1 g in sodium chloride 0.9 % 100 mL IVPB        1 g 200 mL/hr over 30 Minutes Intravenous Every 24 hours 11/15/21 0106     11/15/21 1200  vancomycin (VANCOREADY) IVPB 750 mg/150 mL        750 mg 150 mL/hr over 60 Minutes Intravenous Every M-W-F (Hemodialysis) 11/15/21 0106     11/15/21 0200  vancomycin (VANCOREADY) IVPB 1500 mg/300 mL        1,500 mg 150 mL/hr over 120 Minutes Intravenous  Once 11/15/21 0106 11/15/21 0709   11/15/21 0200  ceFEPIme (MAXIPIME) 2 g in sodium chloride 0.9 % 100 mL IVPB        2 g 200 mL/hr over 30 Minutes Intravenous  Once 11/15/21 0106 11/15/21 0335   11/15/21 0115  metroNIDAZOLE (FLAGYL) IVPB 500 mg        500  mg 100 mL/hr over 60 Minutes Intravenous Every 12 hours 11/15/21 0057         Objective:  Vital Signs  Vitals:   11/15/21 0930 11/15/21 1003 11/15/21 1030 11/15/21 1100  BP: (!) 169/70 (!) 173/65 (!) 178/72 (!) 164/71  Pulse: 88 82 94 92  Resp: '14 15 20 18  '$ Temp:      TempSrc:      SpO2: 93% 97% 95% 97%  Weight:      Height:        Intake/Output Summary (Last 24 hours) at 11/15/2021 1125 Last data filed at 11/14/2021 1500 Gross per 24 hour  Intake 0 ml  Output --  Net 0 ml   Filed Weights   11/13/21 1501 11/13/21 2059 11/15/21 0823  Weight: 96.2 kg  96.1 kg 95.2 kg    General appearance: Awake alert.  In no distress Resp: Clear to auscultation bilaterally.  Normal effort Cardio: S1-S2 is normal regular.  No S3-S4.  No rubs murmurs or bruit GI: Abdomen is soft.  Nontender nondistended.  Bowel sounds are present normal.  No masses organomegaly Extremities: No edema.  Neurologic:  No focal neurological deficits.    Lab Results:  Data Reviewed: I have personally reviewed following labs and reports of the imaging studies  CBC: Recent Labs  Lab 11/10/21 1246 11/13/21 0435 11/14/21 0550 11/15/21 0302  WBC 3.9* 5.8 7.6 5.6  NEUTROABS  --  4.3  --  4.5  HGB 9.7* 8.9* 10.1* 9.5*  HCT 28.1* 26.6* 31.0* 29.1*  MCV 91.8 92.0 92.5 93.9  PLT 79* 89* 108* 82*    Basic Metabolic Panel: Recent Labs  Lab 11/09/21 1231 11/10/21 1246 11/13/21 0435 11/14/21 0550 11/15/21 0302  NA  --  134* 130* 130* 130*  K 4.2 4.5 4.8 4.9 4.8  CL  --  90* 89* 90* 91*  CO2  --  '27 24 26 24  '$ GLUCOSE  --  120* 98 114* 87  BUN  --  56* 64* 38* 59*  CREATININE  --  12.63* 13.05* 9.05* 12.36*  CALCIUM  --  9.9 9.0 9.6 9.5  MG  --   --  2.6* 2.2 2.3  PHOS  --  6.4*  --  5.6* 6.3*    GFR: Estimated Creatinine Clearance: 7.1 mL/min (A) (by C-G formula based on SCr of 12.36 mg/dL (H)).  Liver Function Tests: Recent Labs  Lab 11/10/21 1246 11/15/21 0302  AST  --  15  ALT  --  11  ALKPHOS  --  63  BILITOT  --  1.6*  PROT  --  6.5  ALBUMIN 3.2* 3.2*      Coagulation Profile: Recent Labs  Lab 11/15/21 0302  INR 1.2     CBG: Recent Labs  Lab 11/08/21 1832 11/14/21 0836  GLUCAP 143* 100*     Radiology Studies: Overnight EEG with video  Result Date: 11/15/2021 [name], MD     11/15/2021  9:35 AM Patient Name: ARHAM SYMMONDS MRN: [id number] Epilepsy Attending: [name] Referring Physician/Provider: Rikki Spearing, NP Duration: 11/14/2021 1034 to 11/15/2021 0758  Patient history: 61yo M with new onset  seizures. EEG to evaluate for seizure.  Level of alertness: Awake, asleep  AEDs during EEG study: LEV  Technical aspects: This EEG study was done with scalp electrodes positioned according to the 10-20 International system of electrode placement. Electrical activity was reviewed with band pass filter of 1-'70Hz'$ , sensitivity of 7 uV/mm, display speed  of 784m/sec with a '60Hz'$  notched filter applied as appropriate. EEG data were recorded continuously and digitally stored.  Video monitoring was available and reviewed as appropriate.  Description: At the beginning of the study, EEG showed continuous generalized and lateralized right hemisphere 3-6 theta-delta slowing.  Gradually, EEG improved and showed  posterior dominant rhythm of 8-'9Hz'$  activity of moderate voltage (25-35 uV) seen predominantly in posterior head regions, symmetric and reactive to eye opening and eye closing. Sleep was characterized by vertex waves, sleep spindles (12 to 14 Hz), maximal frontocentral region.  EEG also showed intermittent generalized and lateralized right hemisphere 3 to 6 Hz theta-delta slowing. Hyperventilation and photic stimulation were not performed.    ABNORMALITY - Continuous slow, generalized and lateralized right hemisphere  IMPRESSION: This study was initially suggestive of cortical dysfunction arising from right hemisphere likely secondary to postictal state as well as moderate to severe diffuse encephalopathy.  Gradually, EEG improved and was suggestive of mild to moderate diffuse encephalopathy, nonspecific etiology.  No seizures or epileptiform discharges were seen throughout the recording.  Please note lack of epileptiform activity does not exclude the diagnosis of epilepsy.   PLora Havens   DG CHEST PORT 1 VIEW  Result Date: 11/15/2021 CLINICAL DATA:  Sepsis. EXAM: PORTABLE CHEST 1 VIEW COMPARISON:  08/01/2021. FINDINGS: The heart is enlarged and the mediastinal contour is stable. The pulmonary vasculature is  distended. Interstitial prominence is noted bilaterally. No consolidation, effusion, or pneumothorax. Cervical spinal fusion hardware is noted. No acute osseous abnormality. IMPRESSION: 1. Cardiomegaly with pulmonary vascular congestion. 2. Interstitial prominence bilaterally, possible edema or infiltrate. Electronically Signed   By: LBrett FairyM.D.   On: 11/15/2021 01:45   CT ANGIO HEAD NECK W WO CM W PERF (CODE STROKE)  Result Date: 11/14/2021 CLINICAL DATA:  61year old male code stroke presentation this morning. Recent right side subdural hematoma and seizure like activity. EXAM: CT ANGIOGRAPHY HEAD AND NECK CT PERFUSION BRAIN TECHNIQUE: Multidetector CT imaging of the head and neck was performed using the standard protocol during bolus administration of intravenous contrast. Multiplanar CT image reconstructions and MIPs were obtained to evaluate the vascular anatomy. Carotid stenosis measurements (when applicable) are obtained utilizing NASCET criteria, using the distal internal carotid diameter as the denominator. Multiphase CT imaging of the brain was performed following IV bolus contrast injection. Subsequent parametric perfusion maps were calculated using RAPID software. RADIATION DOSE REDUCTION: This exam was performed according to the departmental dose-optimization program which includes automated exposure control, adjustment of the mA and/or kV according to patient size and/or use of iterative reconstruction technique. CONTRAST:  1072mOMNIPAQUE IOHEXOL 350 MG/ML SOLN COMPARISON:  Head CT, 0851 hours today and earlier. Chest CT 08/01/2021 FINDINGS: CT Brain Perfusion Findings: CBF (<30%) Volume: 5 mL, but appears to be extra-axial artifact associated with the known SDH. Perfusion (Tmax>6.0s) volume: 84m9mno T-max parameter abnormality Mismatch Volume: Not applicable Infarction Location: Not applicable. CTA NECK Skeleton: Absent dentition. Previous lower cervical spine fusion. Cervical spine  degeneration elsewhere. No acute osseous abnormality identified. Upper chest: Respiratory motion. Scattered mostly sub solid bilateral peribronchial nodular opacities. These are new from the July chest CT and appear inflammatory. No pleural effusion. No superior mediastinal lymphadenopathy. Other neck: Small volume retained secretions in the pharynx. Otherwise negative. Aortic arch: Calcified aortic atherosclerosis. 3 vessel arch configuration. Right carotid system: Negative brachiocephalic artery. Mild motion artifact and plaque at the right CCA origin without stenosis. Intermittent plaque before the bifurcation, including combined soft and calcified  plaque series 5, image 119, but no stenosis. Mostly calcified plaque at the bifurcation, right ICA origin and bulb without stenosis. Moderate additional calcified plaque of the right ICA at the skull base with up to 50 % stenosis with respect to the distal vessel (series 5, image 72). Left carotid system: Mild left CCA plaque without stenosis. Calcified plaque at the left ICA origin and bulb with up to 50 % stenosis with respect to the distal vessel. Vertebral arteries: Right subclavian artery origin motion artifact and calcified plaque with estimated 70 % stenosis with respect to the distal vessel. See series 9, image 117. But the right vertebral artery origin is normal. Right vertebral artery is patent to the skull base with no significant plaque or stenosis. Contralateral moderate proximal left subclavian artery calcified plaque with no significant stenosis. Normal left vertebral artery origin. Codominant left vertebral artery is patent to the skull base with no significant plaque or stenosis. CTA HEAD Posterior circulation: Left vertebral V4 segment is normal to the vertebrobasilar junction. Right vertebral V4 segment with tandem severe irregularity and tandem stenoses of 3-4 mm in length leading up to the vertebrobasilar junction. See series 12, images 24 and 23.  Despite this the right V4 segment remains patent. Patent basilar artery with no significant stenosis. Normal SCA and PCA origins. Bilateral AICA origins are patent and may be dominant. Bilateral PCA branches are within normal limits. Anterior circulation: Both ICA siphons are patent with mild to moderate calcified plaque but only mild bilateral siphon stenosis. Patent carotid termini, MCA and ACA origins. Diminutive or absent anterior communicating artery. Bilateral ACA branches are within normal limits. Left MCA M1 segment and bifurcation are patent without stenosis. Left MCA branches are within normal limits. Right MCA M1 segment and bifurcation are patent without stenosis. Right MCA branches remain within normal limits, mild branch mass effect related to known broad-based right side subdural hematoma Venous sinuses: Patent. Right SDH displaces the cortical veins away from the inner table of the right skull as expected. No active extravasation into the right subdural hematoma is identified. Anatomic variants: None. Review of the MIP images confirms the above findings IMPRESSION: 1. Negative for large vessel occlusion, and negative CT Perfusion. 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 And other atherosclerosis in the neck with estimated 70% stenosis of the Right Subclavian Artery origin, up to 50% stenosis of the cervical ICAs. 2. Bilateral upper lobe multifocal peribronchial nodular opacities are new since a July chest CT, nonspecific but suspicious for Acute bronchopneumonia. 3. Known Right Side Subdural Hematoma with mild mass effect on the Right MCA branches. 4.  Aortic Atherosclerosis (ICD10-I70.0). Electronically Signed   By: Genevie Ann M.D.   On: 11/14/2021 09:34   CT HEAD CODE STROKE WO CONTRAST  Result Date: 11/14/2021 CLINICAL DATA:  Code stroke.  Right-sided gaze, slurred speech. EXAM: CT HEAD WITHOUT CONTRAST TECHNIQUE:  Contiguous axial images were obtained from the base of the skull through the vertex without intravenous contrast. RADIATION DOSE REDUCTION: This exam was performed according to the departmental dose-optimization program which includes automated exposure control, adjustment of the mA and/or kV according to patient size and/or use of iterative reconstruction technique. COMPARISON:  CT head 11/12/2019 FINDINGS: Brain: Again seen is the right cerebral convexity subdural hematoma measuring up to 1.0 cm in maximal thickness in the coronal plane (5-34), not significantly changed in maximal size but with new hyperdense blood layering dependently within  the hematoma suggesting interval bleed. A small amount of subdural blood is also seen layering along the right tentorial leaflet, unchanged. The hematoma exerts mass effect on the underlying brain parenchyma with sulcal effacement and partial effacement of the right lateral ventricle but no midline shift. There is no other acute intracranial hemorrhage. There is no evidence of acute territorial infarct. Gray-white differentiation is preserved. Ventricles are stable in size.  There is no solid mass lesion. Vascular: No hyperdense vessel is seen. Skull: Normal. Negative for fracture or focal lesion. Sinuses/Orbits: The paranasal sinuses are clear. The globes and orbits are unremarkable. Other: None. IMPRESSION: 1. Similar size of the right cerebral convexity subdural hematoma measuring up to 1.0 cm in maximal thickness, but with new hyperdense blood layering dependently suggesting interval bleed. Mild mass effect on the underlying brain parenchyma with no midline shift. 2. No evidence of acute territorial infarct. Findings of the initial noncontrast head CT were discussed with Dr. Quinn Axe at 8:55 a.m. Electronically Signed   By: Valetta Mole M.D.   On: 11/14/2021 09:01       LOS: 7 days   [facility] Pager on [email or web address]  11/15/2021, 11:25  AM

## 2021-11-15 NOTE — Sepsis Progress Note (Signed)
Notified bedside nurse of need to draw blood cultures before abx.

## 2021-11-15 NOTE — Progress Notes (Signed)
Nephrology progress note:  Subjective:  last HD on 10/23 with 2.9 kg UF.  Code stroke was activated yesterday am.  Neurology evaluated him and felt his actions were more consistent with seizures; they reached out to me re: same.  Note his meds had recently been changed from keppra to depakote and then back to keppra.  Spoke with nursing.  He was awake and oriented for her and she came to bedside and helped wake him up for me.     Review of systems:   Still with headache No Nausea Reports some shortness of breath; no chest pain   Objective Vital signs in last 24 hours: Vitals:   11/15/21 0233 11/15/21 0323 11/15/21 0426 11/15/21 0659  BP: (!) 177/69 (!) 170/61  (!) 188/74  Pulse:  89    Resp:  18    Temp:  99.4 F (37.4 C)    TempSrc:  Oral    SpO2:  92% 98%   Weight:      Height:       Weight change:   Intake/Output Summary (Last 24 hours) at 11/15/2021 0723 Last data filed at 11/14/2021 1500 Gross per 24 hour  Intake 0 ml  Output --  Net 0 ml     Dialysis Orders:  [facility] MWF  4 hrs 15 min, BFR 450/AF 1.5  EDW 89.9, 2 K/ 2 Ca, UFP 4   Access: LU AVF  Hep 2500   STOP Heparin (has spontaneous subdural hematoma) Sensipar 60 mg qHD Calcitriol 1.25 mcg PO qHD   Assessment/Plan: New onset seizure -Possibly 2/2 Right SDH per neurology. Neurology following. Now back on keppra and off depakote. Seizure/fall precautions.  Use caution with cefepime given seizure hx Subdural hematoma - Repeat head CT with concern for interval bleed. Neurosurgery has recommended conservative management with outpatient follow-up with them in clinic  ESRD -  On dialysis MWF.  No Heparin with HD. Fevers - abx per primary team.  Caution with cefepime given seizures and ESRD Hypokalemia - resolved and now on 2K bath here    Hypertension/volume  - per primary team; setting of bleed.  optimize volume with HD.  Keeps missing meds - but this time missed 2/2 seizure.  Will move amlodipine to evening dosing to  avoid holding it for HD.  We have started losartan and if needed this can be moved to evening dosing. PRN's available.   Anemia of CKD - previously without indication for ESA now with subdural and uncontrolled HTN.  For now follow trends  Secondary Hyperparathyroidism - when taking PO on sensipar and calcitriol. Sevelamer with meals OSA on CPAP - CPAP at night Chronic thrombocytopenia  - stable  Disposition per primary team    Labs: Basic Metabolic Panel: Recent Labs  Lab 11/10/21 1246 11/13/21 0435 11/14/21 0550 11/15/21 0302  NA 134* 130* 130* 130*  K 4.5 4.8 4.9 4.8  CL 90* 89* 90* 91*  CO2 '27 24 26 24  '$ GLUCOSE 120* 98 114* 87  BUN 56* 64* 38* 59*  CREATININE 12.63* 13.05* 9.05* 12.36*  CALCIUM 9.9 9.0 9.6 9.5  PHOS 6.4*  --  5.6* 6.3*   Liver Function Tests: Recent Labs  Lab 11/10/21 1246 11/15/21 0302  AST  --  15  ALT  --  11  ALKPHOS  --  63  BILITOT  --  1.6*  PROT  --  6.5  ALBUMIN 3.2* 3.2*   No results for input(s): "LIPASE", "AMYLASE" in the last 168 hours.  No results for input(s): "AMMONIA" in the last 168 hours. CBC: Recent Labs  Lab 11/10/21 1246 11/13/21 0435 11/14/21 0550 11/15/21 0302  WBC 3.9* 5.8 7.6 5.6  NEUTROABS  --  4.3  --  4.5  HGB 9.7* 8.9* 10.1* 9.5*  HCT 28.1* 26.6* 31.0* 29.1*  MCV 91.8 92.0 92.5 93.9  PLT 79* 89* 108* 82*   Cardiac Enzymes: No results for input(s): "CKTOTAL", "CKMB", "CKMBINDEX", "TROPONINI" in the last 168 hours. CBG: Recent Labs  Lab 11/08/21 1832 11/14/21 0836  GLUCAP 143* 100*    Iron Studies: No results for input(s): "IRON", "TIBC", "TRANSFERRIN", "FERRITIN" in the last 72 hours. Studies/Results: DG CHEST PORT 1 VIEW  Result Date: 11/15/2021 CLINICAL DATA:  Sepsis. EXAM: PORTABLE CHEST 1 VIEW COMPARISON:  08/01/2021. FINDINGS: The heart is enlarged and the mediastinal contour is stable. The pulmonary vasculature is distended. Interstitial prominence is noted bilaterally. No consolidation,  effusion, or pneumothorax. Cervical spinal fusion hardware is noted. No acute osseous abnormality. IMPRESSION: 1. Cardiomegaly with pulmonary vascular congestion. 2. Interstitial prominence bilaterally, possible edema or infiltrate. Electronically Signed   By: Brett Fairy M.D.   On: 11/15/2021 01:45   CT ANGIO HEAD NECK W WO CM W PERF (CODE STROKE)  Result Date: 11/14/2021 CLINICAL DATA:  61 year old male code stroke presentation this morning. Recent right side subdural hematoma and seizure like activity. EXAM: CT ANGIOGRAPHY HEAD AND NECK CT PERFUSION BRAIN TECHNIQUE: Multidetector CT imaging of the head and neck was performed using the standard protocol during bolus administration of intravenous contrast. Multiplanar CT image reconstructions and MIPs were obtained to evaluate the vascular anatomy. Carotid stenosis measurements (when applicable) are obtained utilizing NASCET criteria, using the distal internal carotid diameter as the denominator. Multiphase CT imaging of the brain was performed following IV bolus contrast injection. Subsequent parametric perfusion maps were calculated using RAPID software. RADIATION DOSE REDUCTION: This exam was performed according to the departmental dose-optimization program which includes automated exposure control, adjustment of the mA and/or kV according to patient size and/or use of iterative reconstruction technique. CONTRAST:  170m OMNIPAQUE IOHEXOL 350 MG/ML SOLN COMPARISON:  Head CT, 0851 hours today and earlier. Chest CT 08/01/2021 FINDINGS: CT Brain Perfusion Findings: CBF (<30%) Volume: 5 mL, but appears to be extra-axial artifact associated with the known SDH. Perfusion (Tmax>6.0s) volume: 043m no T-max parameter abnormality Mismatch Volume: Not applicable Infarction Location: Not applicable. CTA NECK Skeleton: Absent dentition. Previous lower cervical spine fusion. Cervical spine degeneration elsewhere. No acute osseous abnormality identified. Upper chest:  Respiratory motion. Scattered mostly sub solid bilateral peribronchial nodular opacities. These are new from the July chest CT and appear inflammatory. No pleural effusion. No superior mediastinal lymphadenopathy. Other neck: Small volume retained secretions in the pharynx. Otherwise negative. Aortic arch: Calcified aortic atherosclerosis. 3 vessel arch configuration. Right carotid system: Negative brachiocephalic artery. Mild motion artifact and plaque at the right CCA origin without stenosis. Intermittent plaque before the bifurcation, including combined soft and calcified plaque series 5, image 119, but no stenosis. Mostly calcified plaque at the bifurcation, right ICA origin and bulb without stenosis. Moderate additional calcified plaque of the right ICA at the skull base with up to 50 % stenosis with respect to the distal vessel (series 5, image 72). Left carotid system: Mild left CCA plaque without stenosis. Calcified plaque at the left ICA origin and bulb with up to 50 % stenosis with respect to the distal vessel. Vertebral arteries: Right subclavian artery origin motion artifact and calcified plaque with estimated  70 % stenosis with respect to the distal vessel. See series 9, image 117. But the right vertebral artery origin is normal. Right vertebral artery is patent to the skull base with no significant plaque or stenosis. Contralateral moderate proximal left subclavian artery calcified plaque with no significant stenosis. Normal left vertebral artery origin. Codominant left vertebral artery is patent to the skull base with no significant plaque or stenosis. CTA HEAD Posterior circulation: Left vertebral V4 segment is normal to the vertebrobasilar junction. Right vertebral V4 segment with tandem severe irregularity and tandem stenoses of 3-4 mm in length leading up to the vertebrobasilar junction. See series 12, images 24 and 23. Despite this the right V4 segment remains patent. Patent basilar artery with  no significant stenosis. Normal SCA and PCA origins. Bilateral AICA origins are patent and may be dominant. Bilateral PCA branches are within normal limits. Anterior circulation: Both ICA siphons are patent with mild to moderate calcified plaque but only mild bilateral siphon stenosis. Patent carotid termini, MCA and ACA origins. Diminutive or absent anterior communicating artery. Bilateral ACA branches are within normal limits. Left MCA M1 segment and bifurcation are patent without stenosis. Left MCA branches are within normal limits. Right MCA M1 segment and bifurcation are patent without stenosis. Right MCA branches remain within normal limits, mild branch mass effect related to known broad-based right side subdural hematoma Venous sinuses: Patent. Right SDH displaces the cortical veins away from the inner table of the right skull as expected. No active extravasation into the right subdural hematoma is identified. Anatomic variants: None. Review of the MIP images confirms the above findings IMPRESSION: 1. Negative for large vessel occlusion, and negative CT Perfusion. 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 And other atherosclerosis in the neck with estimated 70% stenosis of the Right Subclavian Artery origin, up to 50% stenosis of the cervical ICAs. 2. Bilateral upper lobe multifocal peribronchial nodular opacities are new since a July chest CT, nonspecific but suspicious for Acute bronchopneumonia. 3. Known Right Side Subdural Hematoma with mild mass effect on the Right MCA branches. 4.  Aortic Atherosclerosis (ICD10-I70.0). Electronically Signed   By: Genevie Ann M.D.   On: 11/14/2021 09:34   CT HEAD CODE STROKE WO CONTRAST  Result Date: 11/14/2021 CLINICAL DATA:  Code stroke.  Right-sided gaze, slurred speech. EXAM: CT HEAD WITHOUT CONTRAST TECHNIQUE: Contiguous axial images were obtained from the base of the skull through the  vertex without intravenous contrast. RADIATION DOSE REDUCTION: This exam was performed according to the departmental dose-optimization program which includes automated exposure control, adjustment of the mA and/or kV according to patient size and/or use of iterative reconstruction technique. COMPARISON:  CT head 11/12/2019 FINDINGS: Brain: Again seen is the right cerebral convexity subdural hematoma measuring up to 1.0 cm in maximal thickness in the coronal plane (5-34), not significantly changed in maximal size but with new hyperdense blood layering dependently within the hematoma suggesting interval bleed. A small amount of subdural blood is also seen layering along the right tentorial leaflet, unchanged. The hematoma exerts mass effect on the underlying brain parenchyma with sulcal effacement and partial effacement of the right lateral ventricle but no midline shift. There is no other acute intracranial hemorrhage. There is no evidence of acute territorial infarct. Gray-white differentiation is preserved. Ventricles are stable in size.  There is no solid mass lesion. Vascular: No hyperdense vessel is seen. Skull: Normal. Negative for fracture or focal lesion. Sinuses/Orbits:  The paranasal sinuses are clear. The globes and orbits are unremarkable. Other: None. IMPRESSION: 1. Similar size of the right cerebral convexity subdural hematoma measuring up to 1.0 cm in maximal thickness, but with new hyperdense blood layering dependently suggesting interval bleed. Mild mass effect on the underlying brain parenchyma with no midline shift. 2. No evidence of acute territorial infarct. Findings of the initial noncontrast head CT were discussed with Dr. Quinn Axe at 8:55 a.m. Electronically Signed   By: Valetta Mole M.D.   On: 11/14/2021 09:01   Medications: Infusions:  ceFEPime (MAXIPIME) IV     levETIRAcetam Stopped (11/13/21 2113)   levETIRAcetam Stopped (11/14/21 1104)   metronidazole 500 mg (11/15/21 0359)    vancomycin       Scheduled Medications:  amLODipine  10 mg Oral Daily   calcitRIOL  1 mcg Oral Q M,W,F-HD   Chlorhexidine Gluconate Cloth  6 each Topical Q0600   cholecalciferol  4,000 Units Oral Daily   doxazosin  4 mg Oral Daily   guaiFENesin  1,200 mg Oral BID   isosorbide mononitrate  30 mg Oral Daily   labetalol  10 mg Intravenous Once   losartan  25 mg Oral Daily   methocarbamol  750 mg Oral QID   metoprolol tartrate  100 mg Oral BID   multivitamin  1 tablet Oral Daily   pantoprazole  40 mg Oral BID AC   pravastatin  40 mg Oral Daily   pregabalin  25 mg Oral Daily   senna-docusate  1 tablet Oral BID   sevelamer carbonate  3,200 mg Oral TID WC    have reviewed scheduled and prn medications.  Physical Exam:     General adult male in bed undergoing continuous EEG HEENT normocephalic atraumatic sclera anicteric Neck supple trachea midline Lungs clear to auscultation bilaterally normal work of breathing at rest  Heart S1S2 no rub Abdomen soft nontender nondistended Extremities no edema  Psych normal mood and affect Neuro - harder to wake up today but does eventually wake to voice and oriented x 3 for me with RN at bedside; falls back asleep Access LUE AVF bruit and thrill    Claudia Desanctis, MD 11/15/2021, 7:43 AM

## 2021-11-16 ENCOUNTER — Inpatient Hospital Stay (HOSPITAL_COMMUNITY): Payer: Medicare Other

## 2021-11-16 ENCOUNTER — Ambulatory Visit: Payer: Medicare Other | Admitting: Family

## 2021-11-16 DIAGNOSIS — R569 Unspecified convulsions: Secondary | ICD-10-CM | POA: Diagnosis not present

## 2021-11-16 DIAGNOSIS — R509 Fever, unspecified: Secondary | ICD-10-CM | POA: Diagnosis not present

## 2021-11-16 DIAGNOSIS — I4891 Unspecified atrial fibrillation: Secondary | ICD-10-CM | POA: Diagnosis not present

## 2021-11-16 DIAGNOSIS — S065XAA Traumatic subdural hemorrhage with loss of consciousness status unknown, initial encounter: Secondary | ICD-10-CM | POA: Diagnosis not present

## 2021-11-16 LAB — BLOOD CULTURE ID PANEL (REFLEXED) - BCID2

## 2021-11-16 LAB — ECHOCARDIOGRAM COMPLETE
AR max vel: 1.69 cm2
AV Area VTI: 1.69 cm2
AV Area mean vel: 1.89 cm2
AV Mean grad: 8 mmHg
AV Peak grad: 14.7 mmHg
Ao pk vel: 1.92 m/s
Area-P 1/2: 3.61 cm2
Height: 67 in
S' Lateral: 4.1 cm
Weight: 3199.32 oz

## 2021-11-16 LAB — CBC
HCT: 26.8 % — ABNORMAL LOW (ref 39.0–52.0)
Hemoglobin: 9 g/dL — ABNORMAL LOW (ref 13.0–17.0)
MCH: 31.3 pg (ref 26.0–34.0)
MCHC: 33.6 g/dL (ref 30.0–36.0)
MCV: 93.1 fL (ref 80.0–100.0)
Platelets: 90 10*3/uL — ABNORMAL LOW (ref 150–400)
RBC: 2.88 MIL/uL — ABNORMAL LOW (ref 4.22–5.81)
RDW: 13.5 % (ref 11.5–15.5)
WBC: 3.6 10*3/uL — ABNORMAL LOW (ref 4.0–10.5)
nRBC: 0 % (ref 0.0–0.2)

## 2021-11-16 LAB — RENAL FUNCTION PANEL
Albumin: 2.9 g/dL — ABNORMAL LOW (ref 3.5–5.0)
Anion gap: 13 (ref 5–15)
BUN: 41 mg/dL — ABNORMAL HIGH (ref 6–20)
CO2: 27 mmol/L (ref 22–32)
Calcium: 9 mg/dL (ref 8.9–10.3)
Chloride: 91 mmol/L — ABNORMAL LOW (ref 98–111)
Creatinine, Ser: 9.13 mg/dL — ABNORMAL HIGH (ref 0.61–1.24)
GFR, Estimated: 6 mL/min — ABNORMAL LOW (ref >60)
Glucose, Bld: 139 mg/dL — ABNORMAL HIGH (ref 70–99)
Phosphorus: 6.1 mg/dL — ABNORMAL HIGH (ref 2.5–4.6)
Potassium: 4.1 mmol/L (ref 3.5–5.1)
Sodium: 131 mmol/L — ABNORMAL LOW (ref 135–145)

## 2021-11-16 LAB — T4, FREE: Free T4: 0.82 ng/dL (ref 0.61–1.12)

## 2021-11-16 LAB — TSH: TSH: 2.245 u[IU]/mL (ref 0.350–4.500)

## 2021-11-16 MED ORDER — CHLORHEXIDINE GLUCONATE CLOTH 2 % EX PADS
6.0000 | MEDICATED_PAD | Freq: Every day | CUTANEOUS | Status: DC
  Administered 2021-11-17 – 2021-11-19 (×3): 6 via TOPICAL

## 2021-11-16 MED ORDER — LOSARTAN POTASSIUM 50 MG PO TABS
50.0000 mg | ORAL_TABLET | Freq: Every evening | ORAL | Status: DC
  Administered 2021-11-16 – 2021-11-18 (×3): 50 mg via ORAL
  Filled 2021-11-16 (×3): qty 1

## 2021-11-16 MED ORDER — AMLODIPINE BESYLATE 10 MG PO TABS
10.0000 mg | ORAL_TABLET | Freq: Every day | ORAL | Status: DC
  Administered 2021-11-16: 10 mg via ORAL
  Filled 2021-11-16: qty 1

## 2021-11-16 NOTE — Plan of Care (Signed)
Neurology plan of care  Head CT on 10/24 showed similar size SDH but with some hyperdense layering suggesting interval bleed from prior. MRI also shows some new additional (small) collections. Recommend touching base with [facility] about this - will defer to them re: SDH mgmt. Neuro will sign off, but please let us know if new neurologic concerns arise.  Final AED recommendations: - Continue Keppra 500 mg daily and 250 mg after HD  - Continue Lyrica to '50mg'$  once daily (ESRD dosing, serves as AED and also for headaches) - Seizure precautions   Su Monks, MD [facility] [phone]  If 7pm- 7am, please page neurology on call as listed in [place].

## 2021-11-16 NOTE — Progress Notes (Signed)
Physical Therapy Treatment Patient Details Name: Kirk [name] MRN: [id number] DOB: [birth year] Today's Date: 11/16/2021   History of Present Illness Pt is a 61 y.o. male who presented to the ED 10/17 with seizure-like activity. CT revealed R frontal SDH. Near-syncopal-type episode during therapy 10/21, orthostatics stable. 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  Safely to recliner with increased time max cues for stopping to balance and mod A overall.  Patient eager to progress and walk further.  Has support and feel given new changes on MRI should consider acute inpatient rehab prior to d/c home.  PT will continue to follow acutely.   Recommendations for follow up therapy are one component of a multi-disciplinary discharge planning process, led by the attending physician.  Recommendations may be updated based on patient status, additional functional criteria and insurance authorization.  Follow Up Recommendations  Acute inpatient rehab (3hours/day)     Assistance Recommended at Discharge Frequent or constant Supervision/Assistance  Patient can return home with the following A lot of help with walking and/or transfers;A little help with bathing/dressing/bathroom;Help with stairs or ramp for entrance;Assist for transportation   Equipment Recommendations  Other (comment)  (TBA)    Recommendations for Other Services       Precautions / Restrictions Precautions Precautions: Fall Precaution Comments: watch vitals     Mobility  Bed Mobility   Bed Mobility: Supine to Sit   Sidelying to sit: Min assist, HOB elevated       General bed mobility comments: pulling up to sit with PT support and A for lines    Transfers Overall transfer level: Needs assistance Equipment used: Rolling walker (2 wheels) Transfers: Sit to/from Stand Sit to Stand: Min guard           General transfer comment: assist for lines to stand from EOB, mod A for sitting on recliner due to pt fatigue and weakness/imbalance    Ambulation/Gait Ambulation/Gait assistance: Min assist, Mod assist, Max assist Gait Distance (Feet): 50 Feet Assistive device: Rolling walker (2 wheels) Gait Pattern/deviations: Step-through pattern, Decreased stride length, Staggering right, Staggering left       General Gait Details: initially walking with min a for safety, as progressed into hallway, pt off balance and weaker and reported difficulty controlling his muscles so returned to recliner in room with several stops to regain balance with mod to max A for safety   Stairs             Wheelchair Mobility    Modified Rankin (Stroke Patients Only) Modified Rankin (Stroke Patients Only) Pre-Morbid Rankin Score: Slight disability Modified Rankin: Moderately severe disability     Balance Overall balance assessment: Needs assistance Sitting-balance support: Feet supported Sitting balance-Leahy Scale: Fair Sitting balance - Comments: leaning forward elbows on knees reported not feeling well   Standing balance support: Bilateral upper extremity supported Standing balance-Leahy Scale:  Poor Standing balance comment: UE support for standing and then needing mod to max A for dynamic balance during ambulation                            Cognition Arousal/Alertness:  Awake/alert Behavior During Therapy: Flat affect Overall Cognitive Status: Impaired/Different from baseline Area of Impairment: Following commands, Safety/judgement, Problem solving                       Following Commands: Follows one step commands consistently, Follows one step commands with increased time Safety/Judgement: Decreased awareness of safety   Problem Solving: Slow processing General Comments: affect appropriate for situation with pt not feeling well, but appropriate during interactions and aware of new bleeds per conversation with MD earlier today        Exercises      General Comments General comments (skin integrity, edema, etc.): VSS with SBP 150's both supine and sitting then after ambulation SBP 165. Set up meal tray and RN aware of balance issues and pt not feeling well      Pertinent Vitals/Pain Pain Assessment Faces Pain Scale: Hurts little more Pain Location: hands Pain Descriptors / Indicators: Numbness, Tingling Pain Intervention(s): Monitored during session    Home Living                          Prior Function            PT Goals (current goals can now be found in the care plan section) Progress towards PT goals: Progressing toward goals    Frequency    Min 4X/week      PT Plan Discharge plan needs to be updated    Co-evaluation              AM-PAC PT "6 Clicks" Mobility   Outcome Measure  Help needed turning from your back to your side while in a flat bed without using bedrails?: A Little Help needed moving from lying on your back to sitting on the side of a flat bed without using bedrails?: A Little Help needed moving to and from a bed to a chair (including a wheelchair)?: A Little Help needed standing up from a chair using your arms (e.g., wheelchair or bedside chair)?: A Little Help needed to walk in hospital room?: A Lot Help needed climbing 3-5 steps with a railing? : Total 6 Click Score: 15     End of Session Equipment Utilized During Treatment: Gait belt Activity Tolerance: Patient limited by fatigue Patient left: in chair;with call bell/phone within reach;with chair alarm set Nurse Communication: Mobility status PT Visit Diagnosis: Muscle weakness (generalized) (M62.81);Other abnormalities of gait and mobility (R26.89)     Time: 6712-4580 PT Time Calculation (min) (ACUTE ONLY): 23 min  Charges:  $Gait Training: 8-22 mins $Therapeutic Activity: 8-22 mins                     Magda Kiel, PT Acute Rehabilitation Services Office:[phone] 11/16/2021    [name] 11/16/2021, 2:53 PM

## 2021-11-16 NOTE — Progress Notes (Signed)
Occupational Therapy Treatment  Patient Details Name: Kirk [name] MRN: [id number] DOB: May 03, 1960 Today's Date: 11/16/2021   History of present illness Pt is a 61 y.o. male who presented to the ED 10/17 with seizure-like activity. CT revealed R frontal SDH. Near-syncopal-type episode during therapy 10/21, orthostatics stable. 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 Pt with decreased activity tolerance, poor balance, decreased strength, and difficulty sustaining desired actions at times. Pt performing oral care with min A and engaging in UE exercises. Pleasant and agreeable to session, however, benefiting from engagement in listening to desirable music to elevate mood state and pt with good response. Pt observed to drop hands by sides when reaching for items as if fatiguing quickly, but observed to hold phone sitting EOB for sustained period despite having dropped toothbrush and other items sitting EOB. Performing oral care with min A, and finger to nose to challenge proprioception with min undershooting. Pt reporting he is fatigued today. Due to pt motivation, recommending AIR to optimize safety and independence in ADL and IADL.    Recommendations for follow up therapy are one component of a multi-disciplinary discharge planning process, led by the attending physician.  Recommendations may be updated based on patient status, additional functional criteria and insurance authorization.    Follow Up  Recommendations  Acute inpatient rehab (3hours/day)    Assistance Recommended at Discharge Frequent or constant Supervision/Assistance  Patient can return home with the following  A little help with walking and/or transfers;A little help with bathing/dressing/bathroom;Assistance with cooking/housework;Direct supervision/assist for medications management;Direct supervision/assist for financial management;Assist for transportation;Help with stairs or ramp for entrance   Equipment Recommendations  BSC/3in1    Recommendations for Other Services      Precautions / Restrictions Precautions Precautions: Fall Precaution Comments: watch vitals Restrictions Weight Bearing Restrictions: No       Mobility Bed Mobility Overal bed mobility: Needs Assistance Bed Mobility: Sit to Supine, Sidelying to Sit   Sidelying to sit: Min assist, HOB elevated   Sit to supine: Min guard   General bed mobility comments: pulling up to sit with PT support and A for lines. Pt reacing toward therapist's hand several times with LUE and touching hand, but then immediately dropping hand back down by side. Able to sustain pulling on therapist provided therapist's hand and grasp.    Transfers Overall transfer level: Needs assistance Equipment used: Rolling walker (2 wheels) Transfers: Sit to/from Stand Sit to Stand: Min assist           General transfer comment: Min A for steadying.     Balance Overall balance assessment: Needs assistance Sitting-balance support: Feet supported Sitting balance-Leahy Scale: Fair Sitting balance - Comments: leaning forward elbows on knees reported not feeling well   Standing balance support: Bilateral upper extremity supported Standing balance-Leahy Scale: Poor                             ADL either performed or assessed with  clinical judgement   ADL Overall ADL's : Needs assistance/impaired     Grooming: Oral care;Minimal assistance;Sitting Grooming  Details (indicate cue type and reason): Min A due to requiring assist to grasp toothpaste with LUE while unscrewing cap with RUE. Pt dropped toothbrush once during session                 Toilet Transfer: Minimal assistance;Stand-pivot Toilet Transfer Details (indicate cue type and reason): Simulated toward HOB           General ADL Comments: Observed to drop several items and with difficulty sustaining motor actions during sesison, but holding phone sitting EOB for sustained period    Extremity/Trunk Assessment Upper Extremity Assessment Upper Extremity Assessment: LUE deficits/detail LUE Deficits / Details: Decreased AROM at shoulder due to "bad shoulder."  Used L arm with spontaneous movements but not to command. LUE: Shoulder pain with ROM LUE Coordination: decreased gross motor   Lower Extremity Assessment Lower Extremity Assessment: Defer to PT evaluation        Vision   Vision Assessment?: Vision impaired- to be further tested in functional context Additional Comments: Pt tracking therapist on L and R sides. Some decrased attention and decreased eye contact. Fatigues easily.   Perception     Praxis      Cognition Arousal/Alertness: Awake/alert Behavior During Therapy: Flat affect Overall Cognitive Status: Impaired/Different from baseline Area of Impairment: Following commands, Safety/judgement, Problem solving, Attention                   Current Attention Level: Selective   Following Commands: Follows one step commands consistently, Follows one step commands with increased time Safety/Judgement: Decreased awareness of safety   Problem Solving: Slow processing General Comments: affect appropriate for situation with pt not feeling well, but appropriate during interactions and aware of new bleeds per conversation with MD earlier today. Pt singing gospel song during session. Pt appears fatigued this session, so optimizing session with pt lead session.         Exercises Exercises: Other exercises Other Exercises Other Exercises: finger to nose 10x with LUE. Pt with slight undershooting. Other Exercises: Composite grasp 10x LUE Other Exercises: Functional reach 10x Other Exercises: Sitting EOB ~5 min with min guard A.    Shoulder Instructions       General Comments VSS    Pertinent Vitals/ Pain       Pain Assessment Pain Assessment: Faces Faces Pain Scale: Hurts little more Pain Location: LUE heaviness Pain Descriptors / Indicators: Heaviness Pain Intervention(s): Monitored during session, Limited activity within patient's tolerance  Home Living                                          Prior Functioning/Environment              Frequency  Min 2X/week        Progress Toward Goals  OT Goals(current goals can now be found in the care plan section)  Progress towards OT goals: Progressing toward goals (slow progress this session)  Acute Rehab OT Goals Patient Stated Goal: none stated OT Goal Formulation: With patient Time For Goal Achievement: 11/26/21 Potential to Achieve Goals: Good ADL Goals Pt Will Perform Grooming: Independently;standing Pt Will Transfer to Toilet: with modified independence;ambulating;bedside commode Pt Will Perform Toileting - Clothing Manipulation and hygiene: with modified independence;sit to/from stand Additional ADL Goal #1:  Pt will be safe throughout the session with RW use.  Plan Discharge plan remains appropriate    Co-evaluation                 AM-PAC OT "6 Clicks" Daily Activity     Outcome Measure   Help from another person eating meals?: A Little Help from another person taking care of personal grooming?: A Little Help from another person toileting, which includes using toliet, bedpan, or urinal?: A Lot Help from another person bathing (including washing, rinsing, drying)?: A Lot Help from another person to put on and taking off regular upper  body clothing?: A Little Help from another person to put on and taking off regular lower body clothing?: A Lot 6 Click Score: 15    End of Session Equipment Utilized During Treatment: Rolling walker (2 wheels)  OT Visit Diagnosis: Unsteadiness on feet (R26.81);Other abnormalities of gait and mobility (R26.89);Muscle weakness (generalized) (M62.81);Pain;Other symptoms and signs involving cognitive function Pain - Right/Left: Left Pain - part of body:  (LUE heaviness)   Activity Tolerance Patient limited by fatigue   Patient Left in bed;with call bell/phone within reach;with nursing/sitter in room;with bed alarm set   Nurse Communication Mobility status;Other (comment) (Pt with inconsistencies in ability. Unsure if due to fatigue)        Time: 3007-6226 OT Time Calculation (min): 24 min  Charges: OT General Charges $OT Visit: 1 Visit OT Evaluation $OT Re-eval: 1 Re-eval OT Treatments $Self Care/Home Management : 8-22 mins  Shanda Howells, OTR/L [facility] Acute Rehabilitation Office: [phone]   Lula Olszewski 11/16/2021, 5:54 PM

## 2021-11-16 NOTE — Progress Notes (Signed)
Echocardiogram 2D Echocardiogram has been performed.  Oneal Deputy Cailynn Bodnar RDCS 11/16/2021, 3:19 PM

## 2021-11-16 NOTE — TOC Progression Note (Addendum)
Transition of Care [facility]) - Progression Note    Patient Details  Name: MORSE BRUEGGEMANN MRN: [id number] Date of Birth: Mar 30, 1960  [facility] CM/SW Contact  Pollie Friar, RN Phone Number: 11/16/2021, 11:27 AM  Clinical Narrative:    Pt with some changes this week and r/o seizures. Awaiting needs for home closer to dc.  TOC following.   Expected Discharge Plan: OP Rehab Barriers to Discharge: Continued Medical Work up  Expected Discharge Plan and Services Expected Discharge Plan: OP Rehab   Discharge Planning Services: CM Consult   Living arrangements for the past 2 months: Single Family Home                 DME Arranged: Bedside commode DME Agency: [employer] Date DME Agency Contacted: 11/10/21   Representative spoke with at DME Agency: [place]             Social Determinants of Health ([place]) Interventions    Readmission Risk Interventions     No data to display

## 2021-11-16 NOTE — Progress Notes (Signed)
Nephrology progress note:  Subjective:  last HD on 10/25 with 3.5 kg UF.  Spoke with primary team. Got IV dilt for rapid a fib yesterday - afib felt to have been precipitated by being off meds and getting HD.   Amlodipine was stopped.    Review of systems:    Still with headache reports Nausea Reports some shortness of breath; no chest pain   Objective Vital signs in last 24 hours: Vitals:   11/16/21 0330 11/16/21 0400 11/16/21 0429 11/16/21 0529  BP: (!) 163/65 (!) 179/69 (!) 142/49 (!) 166/69  Pulse:      Resp: '19 14 19 19  '$ Temp: 97.7 F (36.5 C)     TempSrc: Oral     SpO2: 95%     Weight:      Height:       Weight change:   Intake/Output Summary (Last 24 hours) at 11/16/2021 0748 Last data filed at 11/16/2021 0654 Gross per 24 hour  Intake 2069.92 ml  Output 3.5 ml  Net 2066.42 ml     Dialysis Orders:  [facility] MWF  4 hrs 15 min, BFR 450/AF 1.5  EDW 89.9, 2 K/ 2 Ca, UFP 4   Access: LU AVF  Hep 2500   STOP Heparin (has spontaneous subdural hematoma) Sensipar 60 mg qHD Calcitriol 1.25 mcg PO qHD   Assessment/Plan: New onset seizure - Possibly 2/2 Right SDH per neurology. Neurology following. Now back on keppra and off depakote. Seizure/fall precautions.   Subdural hematoma - Repeat head CT with concern for interval bleed. Neurosurgery has recommended conservative management with outpatient follow-up with them in clinic ESRD -  On dialysis MWF.  No Heparin with HD. Fever - abx per primary team.  Caution with cefepime given seizures and ESRD - appears now on ceftriaxone Hypokalemia - resolved and now on 2K bath here   Hypertension/volume  - per primary team; setting of intracranial bleed.  optimize volume with HD.  In the past has missed meds due to HD and seizure/ams.  Have moved amlodipine to evening dosing to avoid holding it for HD - appears is now off as he got IV cardiazem for rapid afib.  We have started losartan - will increase to 50 mg daily and move to evening  dosing so that it isn't held for HD. PRN's available  Anemia of CKD - previously without indication for ESA now with subdural hematoma and uncontrolled HTN.  For now follow trends  Secondary Hyperparathyroidism - when taking PO on calcitriol. Sevelamer with meals OSA on CPAP - CPAP at night Chronic thrombocytopenia  - stable Afib with RVR - rapid afib s/p dilt on 10/25. Now off.  Per primary team. On beta blocker  Disposition per primary team    Labs: Basic Metabolic Panel: Recent Labs  Lab 11/10/21 1246 11/13/21 0435 11/14/21 0550 11/15/21 0302  NA 134* 130* 130* 130*  K 4.5 4.8 4.9 4.8  CL 90* 89* 90* 91*  CO2 '27 24 26 24  '$ GLUCOSE 120* 98 114* 87  BUN 56* 64* 38* 59*  CREATININE 12.63* 13.05* 9.05* 12.36*  CALCIUM 9.9 9.0 9.6 9.5  PHOS 6.4*  --  5.6* 6.3*   Liver Function Tests: Recent Labs  Lab 11/10/21 1246 11/15/21 0302  AST  --  15  ALT  --  11  ALKPHOS  --  63  BILITOT  --  1.6*  PROT  --  6.5  ALBUMIN 3.2* 3.2*   No results for input(s): "LIPASE", "AMYLASE"  in the last 168 hours. No results for input(s): "AMMONIA" in the last 168 hours. CBC: Recent Labs  Lab 11/10/21 1246 11/13/21 0435 11/14/21 0550 11/15/21 0302  WBC 3.9* 5.8 7.6 5.6  NEUTROABS  --  4.3  --  4.5  HGB 9.7* 8.9* 10.1* 9.5*  HCT 28.1* 26.6* 31.0* 29.1*  MCV 91.8 92.0 92.5 93.9  PLT 79* 89* 108* 82*   Cardiac Enzymes: No results for input(s): "CKTOTAL", "CKMB", "CKMBINDEX", "TROPONINI" in the last 168 hours. CBG: Recent Labs  Lab 11/14/21 0836  GLUCAP 100*    Iron Studies: No results for input(s): "IRON", "TIBC", "TRANSFERRIN", "FERRITIN" in the last 72 hours. Studies/Results: MR BRAIN WO CONTRAST  Result Date: 11/16/2021 CLINICAL DATA:  Seizure EXAM: MRI HEAD WITHOUT CONTRAST TECHNIQUE: Multiplanar, multiecho pulse sequences of the brain and surrounding structures were obtained without intravenous contrast. COMPARISON:  No prior MRI, correlation is made with CT head  11/14/2021 FINDINGS: Brain: No restricted diffusion to suggest acute or subacute infarct. Redemonstrated subdural collection along the right cerebral convexity, which measures up to 10 mm, similar to the prior CT. Fluid-fluid levels within the collection, with loculations, consistent with acute on chronic subdural hematoma. This causes some sulcal effacement along the right cerebral hemisphere, with 2 mm right-to-left midline shift. Mild mass effect on the right lateral ventricle. No hydrocephalus. Additional smaller collections along the left cerebral convexity, primarily about the left temporal and occipital lobe, measuring up to 2 mm (series 11, image 11), along the right tentorial leaflet, measuring 2 mm (series 25, image 7), along the right inferior temporal lobe (series 25, image 28), measuring approximately 1 mm, and along the medial aspect of the right temporal lobe, measuring 5 mm (series 25, image 33). No acute parenchymal hemorrhage or mass. Scattered T2 hyperintense signal in the periventricular white matter, likely the sequela of mild chronic small vessel ischemic disease. Evaluation is somewhat limited by motion artifact. Within this limitation, the hippocampi are symmetric in size and signal. No heterotopia or evidence of cortical dysgenesis. Vascular: Normal flow voids. Skull and upper cervical spine: Normal marrow signal. Sinuses/Orbits: No acute finding. Other: The mastoids are well aerated. IMPRESSION: 1. Redemonstrated acute on chronic subdural hematoma along the right cerebral convexity, measuring up to 10 mm, with some sulcal effacement and 2 mm right-to-left midline shift. 2. Additional smaller subdural collections along the left cerebral convexity, along the right tentorial leaflet, and along the right inferior temporal lobe, measuring up to 5 mm. 3. No additional acute intracranial process. No seizure etiology identified. Electronically Signed   By: Merilyn Baba M.D.   On: 11/16/2021 02:58    Overnight EEG with video  Result Date: 11/15/2021 [name], MD     11/15/2021  9:35 AM Patient Name: Kirk [name] MRN: [id number] Epilepsy Attending: [name] Referring Physician/Provider: Rikki Spearing, NP Duration: 11/14/2021 1034 to 11/15/2021 0758  Patient history: 61yo M with new onset seizures. EEG to evaluate for seizure.  Level of alertness: Awake, asleep  AEDs during EEG study: LEV  Technical aspects: This EEG study was done with scalp electrodes positioned according to the 10-20 International system of electrode placement. Electrical activity was reviewed with band pass filter of 1-'70Hz'$ , sensitivity of 7 uV/mm, display speed of 57m/sec with a '60Hz'$  notched filter applied as appropriate. EEG data were recorded continuously and digitally stored.  Video monitoring was available and reviewed as appropriate.  Description: At the beginning of the study, EEG showed continuous generalized and lateralized  right hemisphere 3-6 theta-delta slowing.  Gradually, EEG improved and showed  posterior dominant rhythm of 8-'9Hz'$  activity of moderate voltage (25-35 uV) seen predominantly in posterior head regions, symmetric and reactive to eye opening and eye closing. Sleep was characterized by vertex waves, sleep spindles (12 to 14 Hz), maximal frontocentral region.  EEG also showed intermittent generalized and lateralized right hemisphere 3 to 6 Hz theta-delta slowing. Hyperventilation and photic stimulation were not performed.    ABNORMALITY - Continuous slow, generalized and lateralized right hemisphere  IMPRESSION: This study was initially suggestive of cortical dysfunction arising from right hemisphere likely secondary to postictal state as well as moderate to severe diffuse encephalopathy.  Gradually, EEG improved and was suggestive of mild to moderate diffuse encephalopathy, nonspecific etiology.  No seizures or epileptiform discharges were seen throughout the recording.  Please note lack  of epileptiform activity does not exclude the diagnosis of epilepsy.   [name]    DG CHEST PORT 1 VIEW  Result Date: 11/15/2021 CLINICAL DATA:  Sepsis. EXAM: PORTABLE CHEST 1 VIEW COMPARISON:  08/01/2021. FINDINGS: The heart is enlarged and the mediastinal contour is stable. The pulmonary vasculature is distended. Interstitial prominence is noted bilaterally. No consolidation, effusion, or pneumothorax. Cervical spinal fusion hardware is noted. No acute osseous abnormality. IMPRESSION: 1. Cardiomegaly with pulmonary vascular congestion. 2. Interstitial prominence bilaterally, possible edema or infiltrate. Electronically Signed   By: Brett Fairy M.D.   On: 11/15/2021 01:45   CT ANGIO HEAD NECK W WO CM W PERF (CODE STROKE)  Result Date: 11/14/2021 CLINICAL DATA:  61 year old male code stroke presentation this morning. Recent right side subdural hematoma and seizure like activity. EXAM: CT ANGIOGRAPHY HEAD AND NECK CT PERFUSION BRAIN TECHNIQUE: Multidetector CT imaging of the head and neck was performed using the standard protocol during bolus administration of intravenous contrast. Multiplanar CT image reconstructions and MIPs were obtained to evaluate the vascular anatomy. Carotid stenosis measurements (when applicable) are obtained utilizing NASCET criteria, using the distal internal carotid diameter as the denominator. Multiphase CT imaging of the brain was performed following IV bolus contrast injection. Subsequent parametric perfusion maps were calculated using RAPID software. RADIATION DOSE REDUCTION: This exam was performed according to the departmental dose-optimization program which includes automated exposure control, adjustment of the mA and/or kV according to patient size and/or use of iterative reconstruction technique. CONTRAST:  119m OMNIPAQUE IOHEXOL 350 MG/ML SOLN COMPARISON:  Head CT, 0851 hours today and earlier. Chest CT 08/01/2021 FINDINGS: CT Brain Perfusion Findings: CBF  (<30%) Volume: 5 mL, but appears to be extra-axial artifact associated with the known SDH. Perfusion (Tmax>6.0s) volume: 044m no T-max parameter abnormality Mismatch Volume: Not applicable Infarction Location: Not applicable. CTA NECK Skeleton: Absent dentition. Previous lower cervical spine fusion. Cervical spine degeneration elsewhere. No acute osseous abnormality identified. Upper chest: Respiratory motion. Scattered mostly sub solid bilateral peribronchial nodular opacities. These are new from the July chest CT and appear inflammatory. No pleural effusion. No superior mediastinal lymphadenopathy. Other neck: Small volume retained secretions in the pharynx. Otherwise negative. Aortic arch: Calcified aortic atherosclerosis. 3 vessel arch configuration. Right carotid system: Negative brachiocephalic artery. Mild motion artifact and plaque at the right CCA origin without stenosis. Intermittent plaque before the bifurcation, including combined soft and calcified plaque series 5, image 119, but no stenosis. Mostly calcified plaque at the bifurcation, right ICA origin and bulb without stenosis. Moderate additional calcified plaque of the right ICA at the skull base with up to 50 % stenosis with respect  to the distal vessel (series 5, image 72). Left carotid system: Mild left CCA plaque without stenosis. Calcified plaque at the left ICA origin and bulb with up to 50 % stenosis with respect to the distal vessel. Vertebral arteries: Right subclavian artery origin motion artifact and calcified plaque with estimated 70 % stenosis with respect to the distal vessel. See series 9, image 117. But the right vertebral artery origin is normal. Right vertebral artery is patent to the skull base with no significant plaque or stenosis. Contralateral moderate proximal left subclavian artery calcified plaque with no significant stenosis. Normal left vertebral artery origin. Codominant left vertebral artery is patent to the skull base  with no significant plaque or stenosis. CTA HEAD Posterior circulation: Left vertebral V4 segment is normal to the vertebrobasilar junction. Right vertebral V4 segment with tandem severe irregularity and tandem stenoses of 3-4 mm in length leading up to the vertebrobasilar junction. See series 12, images 24 and 23. Despite this the right V4 segment remains patent. Patent basilar artery with no significant stenosis. Normal SCA and PCA origins. Bilateral AICA origins are patent and may be dominant. Bilateral PCA branches are within normal limits. Anterior circulation: Both ICA siphons are patent with mild to moderate calcified plaque but only mild bilateral siphon stenosis. Patent carotid termini, MCA and ACA origins. Diminutive or absent anterior communicating artery. Bilateral ACA branches are within normal limits. Left MCA M1 segment and bifurcation are patent without stenosis. Left MCA branches are within normal limits. Right MCA M1 segment and bifurcation are patent without stenosis. Right MCA branches remain within normal limits, mild branch mass effect related to known broad-based right side subdural hematoma Venous sinuses: Patent. Right SDH displaces the cortical veins away from the inner table of the right skull as expected. No active extravasation into the right subdural hematoma is identified. Anatomic variants: None. Review of the MIP images confirms the above findings IMPRESSION: 1. Negative for large vessel occlusion, and negative CT Perfusion. 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 And other atherosclerosis in the neck with estimated 70% stenosis of the Right Subclavian Artery origin, up to 50% stenosis of the cervical ICAs. 2. Bilateral upper lobe multifocal peribronchial nodular opacities are new since a July chest CT, nonspecific but suspicious for Acute bronchopneumonia. 3. Known Right Side Subdural Hematoma with mild  mass effect on the Right MCA branches. 4.  Aortic Atherosclerosis (ICD10-I70.0). Electronically Signed   By: Genevie Ann M.D.   On: 11/14/2021 09:34   CT HEAD CODE STROKE WO CONTRAST  Result Date: 11/14/2021 CLINICAL DATA:  Code stroke.  Right-sided gaze, slurred speech. EXAM: CT HEAD WITHOUT CONTRAST TECHNIQUE: Contiguous axial images were obtained from the base of the skull through the vertex without intravenous contrast. RADIATION DOSE REDUCTION: This exam was performed according to the departmental dose-optimization program which includes automated exposure control, adjustment of the mA and/or kV according to patient size and/or use of iterative reconstruction technique. COMPARISON:  CT head 11/12/2019 FINDINGS: Brain: Again seen is the right cerebral convexity subdural hematoma measuring up to 1.0 cm in maximal thickness in the coronal plane (5-34), not significantly changed in maximal size but with new hyperdense blood layering dependently within the hematoma suggesting interval bleed. A small amount of subdural blood is also seen layering along the right tentorial leaflet, unchanged. The hematoma exerts mass effect on the underlying brain parenchyma with sulcal effacement and partial effacement of the right lateral  ventricle but no midline shift. There is no other acute intracranial hemorrhage. There is no evidence of acute territorial infarct. Gray-white differentiation is preserved. Ventricles are stable in size.  There is no solid mass lesion. Vascular: No hyperdense vessel is seen. Skull: Normal. Negative for fracture or focal lesion. Sinuses/Orbits: The paranasal sinuses are clear. The globes and orbits are unremarkable. Other: None. IMPRESSION: 1. Similar size of the right cerebral convexity subdural hematoma measuring up to 1.0 cm in maximal thickness, but with new hyperdense blood layering dependently suggesting interval bleed. Mild mass effect on the underlying brain parenchyma with no midline  shift. 2. No evidence of acute territorial infarct. Findings of the initial noncontrast head CT were discussed with Dr. Quinn Axe at 8:55 a.m. Electronically Signed   By: Valetta Mole M.D.   On: 11/14/2021 09:01   Medications: Infusions:  cefTRIAXone (ROCEPHIN)  IV Stopped (11/15/21 1727)   diltiazem (CARDIZEM) infusion 5 mg/hr (11/15/21 1542)   levETIRAcetam Stopped (11/15/21 2037)   levETIRAcetam Stopped (11/15/21 1627)   metronidazole Stopped (11/16/21 0434)   vancomycin 750 mg (11/15/21 1817)     Scheduled Medications:  calcitRIOL  1 mcg Oral Q M,W,F-HD   Chlorhexidine Gluconate Cloth  6 each Topical Q0600   cholecalciferol  4,000 Units Oral Daily   doxazosin  4 mg Oral Daily   guaiFENesin  1,200 mg Oral BID   isosorbide mononitrate  30 mg Oral Daily   losartan  25 mg Oral Daily   methocarbamol  750 mg Oral QID   metoprolol tartrate  100 mg Oral BID   multivitamin  1 tablet Oral Daily   pantoprazole  40 mg Oral BID AC   pravastatin  40 mg Oral Daily   pregabalin  25 mg Oral Daily   senna-docusate  1 tablet Oral BID   sevelamer carbonate  3,200 mg Oral TID WC    have reviewed scheduled and prn medications.  Physical Exam:      General adult male in bed in NAD HEENT normocephalic atraumatic sclera anicteric Neck supple trachea midline Lungs clear to auscultation bilaterally normal work of breathing at rest  Heart S1S2 no rub Abdomen soft nontender nondistended Extremities no edema  Psych normal mood and affect Neuro - wakes to voice; alert and conversant; follows commands Access LUE AVF bruit and thrill    Claudia Desanctis, MD 11/16/2021, 8:17 AM

## 2021-11-16 NOTE — Progress Notes (Signed)
[facility] PROGRESS NOTE   BIRT REINOSO GDJ:[id number] DOB: 09-03-1960 DOA: 11/07/2021  PCP: Sandrea Hughs, NP  Brief History/Interval Summary: 61 year old male with PMH significant of end-stage renal disease on hemodialysis, Alport syndrome, hypertension, chronic thrombocytopenia, anemia of chronic disease, chronic headaches, hyperlipidemia, OSA on CPAP, pancytopenia, diabetes mellitus type 2, memory impairment, chronic diastolic CHF, chronic bilateral upper extremity and lower extremity weakness since C-spine surgery in February 2023 presented with new onset seizure at home lasting for 2 minutes. On presentation, CT of the head showed subdural hematoma.  Neurology and Neurosurgery were consulted.  He was started on Keppra.  Keppra was subsequently switched to Depakote on 11/11/2021 because of persistent headaches. Later Depakote was discontinued and placed back on Keppra.  10/24: Patient has developed right-sided gaze and left neglect.  Code stroke called.  CT head: No stroke, not a candidate for tPA, LT EEG connected.  Consultants: Neurology.  Neurosurgery  Procedures: EEG with long-term monitoring    Subjective/Interval History: Patient complains of headache this morning.  Some tingling and numbness in his hands.  Denies any nausea or vomiting.     Assessment/Plan:  Fever and tachycardia/bacteremia Noted to have a fever of 100.9 F on 10/24.   Concern for SIRS.  No clear source identified. Work-up was initiated.  Patient placed on vancomycin cefepime and Flagyl.  Due to ongoing concern for seizure activity cefepime was discontinued and changed over to ceftriaxone.   Lactic acid level normal.  Procalcitonin 0.58.  WBC is normal. Chest x-ray noted to be abnormal but more suggestive of interstitial edema rather than infectious infiltrates.  No cough mentioned by patient. No evidence for fever subsequently.   2 out of 4 blood cultures positive for Staph epidermidis.   Likely a contaminant.  We will repeat blood cultures.  Paroxysmal atrial fibrillation with RVR  Patient returned from dialysis on 11/15/21 and was noted to be tachycardic.  EKG showed atrial fibrillation with RVR.  Patient was given a dose of Cardizem.  He had not received his metoprolol over 36 hours due to alteration in mental status and inability to take orally.   Metoprolol was resumed. Patient converted back to sinus rhythm within an hour.  Remains in sinus rhythm this morning. Atrial fibrillation likely provoked due to inability to take medications as well as volume removal with dialysis. Due to subdural hematoma he is currently not a candidate for anticoagulation.  Will benefit from outpatient follow-up with primary care provider and perhaps referral to cardiology at some point in time.   Check TSH.  Last echocardiogram is from 2022 which showed normal systolic function.  May benefit from repeat study while he is here in the hospital.  New onset seizures: Possibly due to right subdural hematoma as per neurology. Patient was started on Keppra,  renally dosed. Continue seizure/fall precautions. Keppra was switched to Depakote on 10/21 due to persistent headache after discussion with neurology. Later Neurology stated higher risk of bleeding and increased mortality with Depakote. Patient was placed back on Keppra 10/23. Depakote discontinued. Now patient is only on Keppra.  Management per neurology.   Subdural hematoma: Repeat CT head still showed some subdural hematoma. Neurosurgery recommended conservative management. Repeat CT head on 10/21 showed trace increase in subdural hemorrhage along the right tentorium. Patient was reevaluated by neurosurgery Dr. Kathyrn Sheriff. Continue supportive care, follow-up neurosurgery clinic. Neurology started Lyrica for headaches. He reports headache has improved. MRI done overnight suggests rebleeding.  Discussed with Dr. Reatha Armour with neurosurgery  who will  evaluate patient.   Left Neglect / Right gaze: 10/24: Patient was noted to have right gaze and left neglect.  Code stroke was called. CT head : No new hemorrhage, CTA is negative for LVO. Likely secondary to seizure activity.   End-stage renal disease on hemodialysis: Nephrology following, continue dialysis as per schedule.  Currently on a Monday Wednesday Friday schedule.   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   Essential hypertension: Blood pressure poorly controlled.  Likely because he was unable to take his oral medications for more than 24 hours.  Currently he is on amlodipine, doxazosin, isosorbide mononitrate, losartan, metoprolol.  Follow trends over the next 24 hours and adjust medications as indicated.   OSA on CPAP: Continue CPAP at night.   Anemia of chronic disease: H&H remains stable.  Monitor intermittently.   Chronic thrombocytopenia: Platelets are stable, monitor intermittently.   GERD: Continue pantoprazole.   Hyperlipidemia Continue statins   Diabetes mellitus type 2: Blood sugars are mostly stable. Continue regular insulin sliding scale   Obesity: Diet and exercise discussed in detail. Estimated body mass index is 33.18 kg/m as calculated from the following:   Height as of this encounter: '5\' 7"'$  (1.702 m).   Weight as of this encounter: 96.1 kg.    Chronic diastolic CHF: Volume is managed by dialysis. Daily weight, intake output charting, fluid restriction   Physical deconditioning: He recommended outpatient PT.  DVT Prophylaxis: SCDs Code Status: Full code Family Communication: Discussed with patient.  No family at bedside Disposition Plan: Seen by PT.  Home health is recommended.  Does not appear that he was able to ambulate with the therapist yesterday.  Status is: Inpatient Remains inpatient appropriate because: Seizure activity      Medications: Scheduled:  amLODipine  10 mg Oral Daily    calcitRIOL  1 mcg Oral Q M,W,F-HD   Chlorhexidine Gluconate Cloth  6 each Topical Q0600   cholecalciferol  4,000 Units Oral Daily   doxazosin  4 mg Oral Daily   guaiFENesin  1,200 mg Oral BID   isosorbide mononitrate  30 mg Oral Daily   losartan  50 mg Oral QPM   methocarbamol  750 mg Oral QID   metoprolol tartrate  100 mg Oral BID   multivitamin  1 tablet Oral Daily   pantoprazole  40 mg Oral BID AC   pravastatin  40 mg Oral Daily   pregabalin  25 mg Oral Daily   senna-docusate  1 tablet Oral BID   sevelamer carbonate  3,200 mg Oral TID WC   Continuous:  cefTRIAXone (ROCEPHIN)  IV Stopped (11/15/21 1727)   levETIRAcetam Stopped (11/15/21 2037)   levETIRAcetam 500 mg (11/16/21 1000)   metronidazole Stopped (11/16/21 0434)   vancomycin 750 mg (11/15/21 1817)   XFG:HWEXHBZJIRCVE **OR** acetaminophen, bisacodyl, cloNIDine, HYDROmorphone (DILAUDID) injection, labetalol, metoCLOPramide (REGLAN) injection, nitroGLYCERIN, ondansetron **OR** ondansetron (ZOFRAN) IV, mouth rinse, oxyCODONE, polyethylene glycol, sevelamer carbonate, traZODone  Antibiotics: Anti-infectives (From admission, onward)    Start     Dose/Rate Route Frequency Ordered Stop   11/15/21 1800  ceFEPIme (MAXIPIME) 1 g in sodium chloride 0.9 % 100 mL IVPB  Status:  Discontinued        1 g 200 mL/hr over 30 Minutes Intravenous Every 24 hours 11/15/21 0106 11/15/21 1132   11/15/21 1600  cefTRIAXone (ROCEPHIN) 1 g in sodium chloride 0.9 % 100 mL IVPB        1 g 200 mL/hr over  30 Minutes Intravenous Every 24 hours 11/15/21 1132     11/15/21 1200  vancomycin (VANCOREADY) IVPB 750 mg/150 mL        750 mg 150 mL/hr over 60 Minutes Intravenous Every M-W-F (Hemodialysis) 11/15/21 0106     11/15/21 0200  vancomycin (VANCOREADY) IVPB 1500 mg/300 mL        1,500 mg 150 mL/hr over 120 Minutes Intravenous  Once 11/15/21 0106 11/15/21 1510   11/15/21 0200  ceFEPIme (MAXIPIME) 2 g in sodium chloride 0.9 % 100 mL IVPB        2  g 200 mL/hr over 30 Minutes Intravenous  Once 11/15/21 0106 11/15/21 1509   11/15/21 0115  metroNIDAZOLE (FLAGYL) IVPB 500 mg        500 mg 100 mL/hr over 60 Minutes Intravenous Every 12 hours 11/15/21 0057         Objective:  Vital Signs  Vitals:   11/16/21 0330 11/16/21 0400 11/16/21 0429 11/16/21 0529  BP: (!) 163/65 (!) 179/69 (!) 142/49 (!) 166/69  Pulse:      Resp: '19 14 19 19  '$ Temp: 97.7 F (36.5 C)     TempSrc: Oral     SpO2: 95%     Weight:      Height:        Intake/Output Summary (Last 24 hours) at 11/16/2021 1025 Last data filed at 11/16/2021 0654 Gross per 24 hour  Intake 2069.92 ml  Output 3.5 ml  Net 2066.42 ml    Filed Weights   11/13/21 2059 11/15/21 0823 11/15/21 1245  Weight: 96.1 kg 95.2 kg 90.7 kg    General appearance: Awake alert.  In no distress Resp: Clear to auscultation bilaterally.  Normal effort Cardio: S1-S2 is normal regular.  No S3-S4.  No rubs murmurs or bruit GI: Abdomen is soft.  Nontender nondistended.  Bowel sounds are present normal.  No masses organomegaly Extremities: No edema.  Able to move all of his extremities Neurologic: Alert and oriented x3.  No focal neurological deficits.     Lab Results:  Data Reviewed: I have personally reviewed following labs and reports of the imaging studies  CBC: Recent Labs  Lab 11/10/21 1246 11/13/21 0435 11/14/21 0550 11/15/21 0302 11/16/21 0834  WBC 3.9* 5.8 7.6 5.6 3.6*  NEUTROABS  --  4.3  --  4.5  --   HGB 9.7* 8.9* 10.1* 9.5* 9.0*  HCT 28.1* 26.6* 31.0* 29.1* 26.8*  MCV 91.8 92.0 92.5 93.9 93.1  PLT 79* 89* 108* 82* 90*     Basic Metabolic Panel: Recent Labs  Lab 11/10/21 1246 11/13/21 0435 11/14/21 0550 11/15/21 0302 11/16/21 0834  NA 134* 130* 130* 130* 131*  K 4.5 4.8 4.9 4.8 4.1  CL 90* 89* 90* 91* 91*  CO2 '27 24 26 24 27  '$ GLUCOSE 120* 98 114* 87 139*  BUN 56* 64* 38* 59* 41*  CREATININE 12.63* 13.05* 9.05* 12.36* 9.13*  CALCIUM 9.9 9.0 9.6 9.5 9.0   MG  --  2.6* 2.2 2.3  --   PHOS 6.4*  --  5.6* 6.3* 6.1*     GFR: Estimated Creatinine Clearance: 9.4 mL/min (A) (by C-G formula based on SCr of 9.13 mg/dL (H)).  Liver Function Tests: Recent Labs  Lab 11/10/21 1246 11/15/21 0302 11/16/21 0834  AST  --  15  --   ALT  --  11  --   ALKPHOS  --  63  --   BILITOT  --  1.6*  --  PROT  --  6.5  --   ALBUMIN 3.2* 3.2* 2.9*       Coagulation Profile: Recent Labs  Lab 11/15/21 0302  INR 1.2      CBG: Recent Labs  Lab 11/14/21 0836  GLUCAP 100*      Radiology Studies: MR BRAIN WO CONTRAST  Result Date: 11/16/2021 CLINICAL DATA:  Seizure EXAM: MRI HEAD WITHOUT CONTRAST TECHNIQUE: Multiplanar, multiecho pulse sequences of the brain and surrounding structures were obtained without intravenous contrast. COMPARISON:  No prior MRI, correlation is made with CT head 11/14/2021 FINDINGS: Brain: No restricted diffusion to suggest acute or subacute infarct. Redemonstrated subdural collection along the right cerebral convexity, which measures up to 10 mm, similar to the prior CT. Fluid-fluid levels within the collection, with loculations, consistent with acute on chronic subdural hematoma. This causes some sulcal effacement along the right cerebral hemisphere, with 2 mm right-to-left midline shift. Mild mass effect on the right lateral ventricle. No hydrocephalus. Additional smaller collections along the left cerebral convexity, primarily about the left temporal and occipital lobe, measuring up to 2 mm (series 11, image 11), along the right tentorial leaflet, measuring 2 mm (series 25, image 7), along the right inferior temporal lobe (series 25, image 28), measuring approximately 1 mm, and along the medial aspect of the right temporal lobe, measuring 5 mm (series 25, image 33). No acute parenchymal hemorrhage or mass. Scattered T2 hyperintense signal in the periventricular white matter, likely the sequela of mild chronic small vessel  ischemic disease. Evaluation is somewhat limited by motion artifact. Within this limitation, the hippocampi are symmetric in size and signal. No heterotopia or evidence of cortical dysgenesis. Vascular: Normal flow voids. Skull and upper cervical spine: Normal marrow signal. Sinuses/Orbits: No acute finding. Other: The mastoids are well aerated. IMPRESSION: 1. Redemonstrated acute on chronic subdural hematoma along the right cerebral convexity, measuring up to 10 mm, with some sulcal effacement and 2 mm right-to-left midline shift. 2. Additional smaller subdural collections along the left cerebral convexity, along the right tentorial leaflet, and along the right inferior temporal lobe, measuring up to 5 mm. 3. No additional acute intracranial process. No seizure etiology identified. Electronically Signed   By: Merilyn Baba M.D.   On: 11/16/2021 02:58   Overnight EEG with video  Result Date: 11/15/2021 [name], MD     11/15/2021  9:35 AM Patient Name: Kirk [name] MRN: [id number] Epilepsy Attending: [name] Referring Physician/Provider: Rikki Spearing, NP Duration: 11/14/2021 1034 to 11/15/2021 0758  Patient history: 61yo M with new onset seizures. EEG to evaluate for seizure.  Level of alertness: Awake, asleep  AEDs during EEG study: LEV  Technical aspects: This EEG study was done with scalp electrodes positioned according to the 10-20 International system of electrode placement. Electrical activity was reviewed with band pass filter of 1-'70Hz'$ , sensitivity of 7 uV/mm, display speed of 68m/sec with a '60Hz'$  notched filter applied as appropriate. EEG data were recorded continuously and digitally stored.  Video monitoring was available and reviewed as appropriate.  Description: At the beginning of the study, EEG showed continuous generalized and lateralized right hemisphere 3-6 theta-delta slowing.  Gradually, EEG improved and showed  posterior dominant rhythm of 8-'9Hz'$  activity of moderate  voltage (25-35 uV) seen predominantly in posterior head regions, symmetric and reactive to eye opening and eye closing. Sleep was characterized by vertex waves, sleep spindles (12 to 14 Hz), maximal frontocentral region.  EEG also showed intermittent generalized and lateralized right  hemisphere 3 to 6 Hz theta-delta slowing. Hyperventilation and photic stimulation were not performed.    ABNORMALITY - Continuous slow, generalized and lateralized right hemisphere  IMPRESSION: This study was initially suggestive of cortical dysfunction arising from right hemisphere likely secondary to postictal state as well as moderate to severe diffuse encephalopathy.  Gradually, EEG improved and was suggestive of mild to moderate diffuse encephalopathy, nonspecific etiology.  No seizures or epileptiform discharges were seen throughout the recording.  Please note lack of epileptiform activity does not exclude the diagnosis of epilepsy.   [name]    DG CHEST PORT 1 VIEW  Result Date: 11/15/2021 CLINICAL DATA:  Sepsis. EXAM: PORTABLE CHEST 1 VIEW COMPARISON:  08/01/2021. FINDINGS: The heart is enlarged and the mediastinal contour is stable. The pulmonary vasculature is distended. Interstitial prominence is noted bilaterally. No consolidation, effusion, or pneumothorax. Cervical spinal fusion hardware is noted. No acute osseous abnormality. IMPRESSION: 1. Cardiomegaly with pulmonary vascular congestion. 2. Interstitial prominence bilaterally, possible edema or infiltrate. Electronically Signed   By: Brett Fairy M.D.   On: 11/15/2021 01:45       LOS: 8 days   [facility] Pager on [email or web address]  11/16/2021, 10:25 AM

## 2021-11-16 NOTE — Progress Notes (Signed)
   Providing Compassionate, Quality Care - Together  NEUROSURGERY PROGRESS NOTE   S: complains of L sided HA  O: EXAM:  BP (!) 137/52 (BP Location: Right Arm)   Pulse 61   Temp 98.2 F (36.8 C) (Oral)   Resp 17   Ht '5\' 7"'$  (1.702 m)   Wt 90.7 kg   SpO2 95%   BMI 31.32 kg/m   Awake, alert, oriented x3 PERRL Speech fluent, appropriate  CNs grossly intact  5/5 BUE/BLE  No drift  ASSESSMENT:  61 y.o. male with   Right acute on chronic SDH with 45m midline shift  PLAN: - MRI showed slightly worsening of SDH with 264mmidline shift.  -cont AEDs -will continue to monitor, no acute nsx intervention -will repeat CT in outpatient setting in 2-3 weeks -cont HD without heparin    Thank you for allowing me to participate in this patient's care.  Please do not hesitate to call with questions or concerns.   TrElwin SleightDOAteneurosurgery & Spine Associates Cell: [phone]

## 2021-11-16 NOTE — Progress Notes (Signed)
? ?  Inpatient Rehab Admissions Coordinator : ? ?Per therapy recommendations, patient was screened for CIR candidacy by Roshon Duell RN MSN.  At this time patient appears to be a potential candidate for CIR. I will place a rehab consult per protocol for full assessment. Please call me with any questions. ? ?Dionis Autry RN MSN ?Admissions Coordinator ?[phone] ?  ?

## 2021-11-16 NOTE — Progress Notes (Signed)
Pt has had multiple c/o not generally feeling well today. C/o nausea earlier, c/o dizziness while ambulating with PT. Remains alert oriented. No obvious onset of changes to current baseline. No new weaknesses.

## 2021-11-16 NOTE — Progress Notes (Signed)
PHARMACY - PHYSICIAN COMMUNICATION CRITICAL VALUE ALERT - BLOOD CULTURE IDENTIFICATION (BCID)  Kirk [name] is an 61 y.o. male who presented to [facility] on 11/07/2021 with a chief complaint of new onset seizure   Assessment:  Staph Epi with resistance  Name of physician (or Provider) Contacted: [place]  Current antibiotics: ceftriaxone and vancomycin  Changes to prescribed antibiotics recommended:  Patient is on recommended antibiotics - No changes needed  Results for orders placed or performed during the hospital encounter of 11/07/21  Blood Culture ID Panel (Reflexed) (Collected: 11/15/2021  3:07 AM)  Result Value Ref Range   Enterococcus faecalis NOT DETECTED NOT DETECTED   Enterococcus Faecium NOT DETECTED NOT DETECTED   Listeria monocytogenes NOT DETECTED NOT DETECTED   Staphylococcus species DETECTED (A) NOT DETECTED   Staphylococcus aureus (BCID) NOT DETECTED NOT DETECTED   Staphylococcus epidermidis DETECTED (A) NOT DETECTED   Staphylococcus lugdunensis NOT DETECTED NOT DETECTED   Streptococcus species NOT DETECTED NOT DETECTED   Streptococcus agalactiae NOT DETECTED NOT DETECTED   Streptococcus pneumoniae NOT DETECTED NOT DETECTED   Streptococcus pyogenes NOT DETECTED NOT DETECTED   A.calcoaceticus-baumannii NOT DETECTED NOT DETECTED   Bacteroides fragilis NOT DETECTED NOT DETECTED   Enterobacterales NOT DETECTED NOT DETECTED   Enterobacter cloacae complex NOT DETECTED NOT DETECTED   Escherichia coli NOT DETECTED NOT DETECTED   Klebsiella aerogenes NOT DETECTED NOT DETECTED   Klebsiella oxytoca NOT DETECTED NOT DETECTED   Klebsiella pneumoniae NOT DETECTED NOT DETECTED   Proteus species NOT DETECTED NOT DETECTED   Salmonella species NOT DETECTED NOT DETECTED   Serratia marcescens NOT DETECTED NOT DETECTED   Haemophilus influenzae NOT DETECTED NOT DETECTED   Neisseria meningitidis NOT DETECTED NOT DETECTED   Pseudomonas aeruginosa NOT DETECTED NOT DETECTED    Stenotrophomonas maltophilia NOT DETECTED NOT DETECTED   Candida albicans NOT DETECTED NOT DETECTED   Candida auris NOT DETECTED NOT DETECTED   Candida glabrata NOT DETECTED NOT DETECTED   Candida krusei NOT DETECTED NOT DETECTED   Candida parapsilosis NOT DETECTED NOT DETECTED   Candida tropicalis NOT DETECTED NOT DETECTED   Cryptococcus neoformans/gattii NOT DETECTED NOT DETECTED   Methicillin resistance mecA/C DETECTED (A) NOT DETECTED    [name] 11/16/2021  3:23 AM

## 2021-11-17 DIAGNOSIS — R569 Unspecified convulsions: Secondary | ICD-10-CM | POA: Diagnosis not present

## 2021-11-17 DIAGNOSIS — S065XAA Traumatic subdural hemorrhage with loss of consciousness status unknown, initial encounter: Secondary | ICD-10-CM | POA: Diagnosis not present

## 2021-11-17 DIAGNOSIS — R509 Fever, unspecified: Secondary | ICD-10-CM | POA: Diagnosis not present

## 2021-11-17 LAB — RENAL FUNCTION PANEL
Albumin: 2.9 g/dL — ABNORMAL LOW (ref 3.5–5.0)
Anion gap: 12 (ref 5–15)
BUN: 18 mg/dL (ref 6–20)
CO2: 30 mmol/L (ref 22–32)
Calcium: 8.8 mg/dL — ABNORMAL LOW (ref 8.9–10.3)
Chloride: 92 mmol/L — ABNORMAL LOW (ref 98–111)
Creatinine, Ser: 5.6 mg/dL — ABNORMAL HIGH (ref 0.61–1.24)
GFR, Estimated: 11 mL/min — ABNORMAL LOW (ref >60)
Glucose, Bld: 123 mg/dL — ABNORMAL HIGH (ref 70–99)
Phosphorus: 3.6 mg/dL (ref 2.5–4.6)
Potassium: 3.2 mmol/L — ABNORMAL LOW (ref 3.5–5.1)
Sodium: 134 mmol/L — ABNORMAL LOW (ref 135–145)

## 2021-11-17 MED ORDER — CLONIDINE HCL 0.1 MG PO TABS
0.1000 mg | ORAL_TABLET | Freq: Once | ORAL | Status: AC
  Administered 2021-11-17: 0.1 mg via ORAL
  Filled 2021-11-17: qty 1

## 2021-11-17 MED ORDER — LABETALOL HCL 5 MG/ML IV SOLN
20.0000 mg | INTRAVENOUS | Status: DC | PRN
  Administered 2021-11-18 – 2021-11-19 (×3): 20 mg via INTRAVENOUS
  Filled 2021-11-17 (×4): qty 4

## 2021-11-17 MED ORDER — HYDRALAZINE HCL 25 MG PO TABS
25.0000 mg | ORAL_TABLET | Freq: Three times a day (TID) | ORAL | Status: DC
  Administered 2021-11-17 – 2021-11-18 (×4): 25 mg via ORAL
  Filled 2021-11-17 (×4): qty 1

## 2021-11-17 NOTE — Progress Notes (Signed)
[facility] PROGRESS NOTE   Kirk [name] JJO:[id number] DOB: 27-Mar-1960 DOA: 11/07/2021  PCP: Sandrea Hughs, NP  Brief History/Interval Summary: 61 year old male with PMH significant of end-stage renal disease on hemodialysis, Alport syndrome, hypertension, chronic thrombocytopenia, anemia of chronic disease, chronic headaches, hyperlipidemia, OSA on CPAP, pancytopenia, diabetes mellitus type 2, memory impairment, chronic diastolic CHF, chronic bilateral upper extremity and lower extremity weakness since C-spine surgery in February 2023 presented with new onset seizure at home lasting for 2 minutes. On presentation, CT of the head showed subdural hematoma.  Neurology and Neurosurgery were consulted.  He was started on Keppra.  Keppra was subsequently switched to Depakote on 11/11/2021 because of persistent headaches. Later Depakote was discontinued and placed back on Keppra.  10/24: Patient has developed right-sided gaze and left neglect.  Code stroke called.  CT head: No stroke, not a candidate for tPA, LT EEG connected.  Consultants: Neurology.  Neurosurgery  Procedures: EEG with long-term monitoring    Subjective/Interval History: Patient mentioned that he had a rough day and night.  Had some nausea with few episodes of vomiting.  Denies any abdominal pain.  Complains of headache.  Complains of tingling and numbness in his hands.     Assessment/Plan:  Fever and tachycardia/bacteremia Noted to have a fever of 100.9 F on 10/24.   Concern for SIRS.  No clear source identified. Work-up was initiated.  Patient placed on vancomycin cefepime and Flagyl.  Due to ongoing concern for seizure activity cefepime was discontinued and changed over to ceftriaxone.   Lactic acid level normal.  Procalcitonin 0.58.  WBC is normal. Chest x-ray noted to be abnormal but more suggestive of interstitial edema rather than infectious infiltrates.  No cough mentioned by patient. Has not had  further episodes of fever. 2 out of 4 blood cultures from 10/25 positive for Staph epidermidis.  Likely a contaminant.  Blood cultures were repeated yesterday.  We will discontinue Flagyl.  Continue vancomycin and ceftriaxone for now.  Once the repeat culture is results are available we should be able to discontinue the vancomycin.  Nausea and vomiting Could be due to elevated blood pressure and headaches.  Abdomen is benign on examination.  Patient to be dialyzed today. Continue symptomatic treatment for now.  Paroxysmal atrial fibrillation with RVR  Patient returned from dialysis on 11/15/21 and was noted to be tachycardic.  EKG showed atrial fibrillation with RVR.  Patient was given a dose of Cardizem.  He had not received his metoprolol over 36 hours due to alteration in mental status and inability to take orally.   Metoprolol was resumed. Patient converted back to sinus rhythm within an hour.  Remains in sinus rhythm this morning. Atrial fibrillation likely provoked due to inability to take medications as well as volume removal with dialysis. Due to subdural hematoma he is currently not a candidate for anticoagulation.  Will benefit from outpatient follow-up with primary care provider and perhaps referral to cardiology at some point in time.   TSH and free T4 noted to be normal.  Echocardiogram shows normal systolic function.  New onset seizures: Possibly due to right subdural hematoma as per neurology. Patient was started on Keppra,  renally dosed. Continue seizure/fall precautions. Keppra was switched to Depakote on 10/21 due to persistent headache after discussion with neurology. Later Neurology stated higher risk of bleeding and increased mortality with Depakote. Patient was placed back on Keppra 10/23. Depakote discontinued. Now patient is only on Keppra.  Management per neurology.  Subdural hematoma: Repeat CT head still showed some subdural hematoma. Neurosurgery recommended  conservative management. Repeat CT head on 10/21 showed trace increase in subdural hemorrhage along the right tentorium. Patient was reevaluated by neurosurgery Dr. Kathyrn Sheriff.  Neurology started Lyrica for headaches. He reports headache has improved. MRI from 10/26 suggests rebleeding.  She was evaluated by Dr. Reatha Armour with neurosurgery.  No indication for any surgical intervention at this time.  PT and OT evaluation.  Inpatient rehabilitation is recommended.    Accelerated hypertension Blood pressure remains poorly controlled.  Likely contributing to his nausea and vomiting.  Patient to be dialyzed today which hopefully will help with blood pressure.  He is on multiple antihypertensives including amlodipine, doxazosin, isosorbide mononitrate, losartan, metoprolol.  Hydralazine has been added this morning.     Left Neglect / Right gaze: 10/24: Patient was noted to have right gaze and left neglect.  Code stroke was called. CT head : No new hemorrhage, CTA is negative for LVO. Likely secondary to seizure activity. Resolved.   End-stage renal disease on hemodialysis: Nephrology following, continue dialysis as per schedule.  Currently on a Monday Wednesday Friday schedule.   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   OSA on CPAP: Continue CPAP at night.   Anemia of chronic disease: H&H remains stable.  Monitor intermittently.   Chronic thrombocytopenia: Platelets are stable, monitor intermittently.   GERD: Continue pantoprazole.   Hyperlipidemia Continue statins   Diabetes mellitus type 2: Stable for the most part.   Obesity: Diet and exercise discussed in detail. Estimated body mass index is 33.18 kg/m as calculated from the following:   Height as of this encounter: '5\' 7"'$  (1.702 m).   Weight as of this encounter: 96.1 kg.    Chronic diastolic CHF: Volume is managed by dialysis. Daily weight, intake output charting, fluid restriction    Physical deconditioning: Inpatient rehabilitation not recommended.  DVT Prophylaxis: SCDs Code Status: Full code Family Communication: Discussed with patient.  No family at bedside Disposition Plan: Seen by PT and OT.  Inpatient rehabilitation is recommended.  Status is: Inpatient Remains inpatient appropriate because: Seizure activity      Medications: Scheduled:  amLODipine  10 mg Oral Daily   calcitRIOL  1 mcg Oral Q M,W,F-HD   Chlorhexidine Gluconate Cloth  6 each Topical Q0600   Chlorhexidine Gluconate Cloth  6 each Topical Q0600   cholecalciferol  4,000 Units Oral Daily   doxazosin  4 mg Oral Daily   guaiFENesin  1,200 mg Oral BID   hydrALAZINE  25 mg Oral Q8H   isosorbide mononitrate  30 mg Oral Daily   losartan  50 mg Oral QPM   methocarbamol  750 mg Oral QID   metoprolol tartrate  100 mg Oral BID   multivitamin  1 tablet Oral Daily   pantoprazole  40 mg Oral BID AC   pravastatin  40 mg Oral Daily   pregabalin  25 mg Oral Daily   senna-docusate  1 tablet Oral BID   sevelamer carbonate  3,200 mg Oral TID WC   Continuous:  cefTRIAXone (ROCEPHIN)  IV 1 g (11/16/21 1545)   levETIRAcetam Stopped (11/15/21 2037)   levETIRAcetam 500 mg (11/17/21 0807)   metronidazole 500 mg (11/17/21 0455)   vancomycin 750 mg (11/15/21 1817)   ERX:[id number] **OR** acetaminophen, bisacodyl, cloNIDine, HYDROmorphone (DILAUDID) injection, labetalol, metoCLOPramide (REGLAN) injection, nitroGLYCERIN, ondansetron **OR** ondansetron (ZOFRAN) IV, mouth rinse, oxyCODONE, polyethylene glycol, sevelamer carbonate, traZODone  Antibiotics: Anti-infectives (From admission,  onward)    Start     Dose/Rate Route Frequency Ordered Stop   11/15/21 1800  ceFEPIme (MAXIPIME) 1 g in sodium chloride 0.9 % 100 mL IVPB  Status:  Discontinued        1 g 200 mL/hr over 30 Minutes Intravenous Every 24 hours 11/15/21 0106 11/15/21 1132   11/15/21 1600  cefTRIAXone (ROCEPHIN) 1 g in sodium chloride 0.9 %  100 mL IVPB        1 g 200 mL/hr over 30 Minutes Intravenous Every 24 hours 11/15/21 1132     11/15/21 1200  vancomycin (VANCOREADY) IVPB 750 mg/150 mL        750 mg 150 mL/hr over 60 Minutes Intravenous Every M-W-F (Hemodialysis) 11/15/21 0106     11/15/21 0200  vancomycin (VANCOREADY) IVPB 1500 mg/300 mL        1,500 mg 150 mL/hr over 120 Minutes Intravenous  Once 11/15/21 0106 11/15/21 1510   11/15/21 0200  ceFEPIme (MAXIPIME) 2 g in sodium chloride 0.9 % 100 mL IVPB        2 g 200 mL/hr over 30 Minutes Intravenous  Once 11/15/21 0106 11/15/21 1509   11/15/21 0115  metroNIDAZOLE (FLAGYL) IVPB 500 mg        500 mg 100 mL/hr over 60 Minutes Intravenous Every 12 hours 11/15/21 0057         Objective:  Vital Signs  Vitals:   11/17/21 0820 11/17/21 0923 11/17/21 0930 11/17/21 1000  BP: (!) 197/69 (!) 198/74 (!) 209/80 (!) 203/77  Pulse: 69 69 68 65  Resp: '16 20 18 14  '$ Temp: 98.3 F (36.8 C) 97.9 F (36.6 C)    TempSrc: Oral Oral    SpO2: 98% 98% 99% 97%  Weight:  91.6 kg    Height:       No intake or output data in the 24 hours ending 11/17/21 1010  Filed Weights   11/15/21 0823 11/15/21 1245 11/17/21 0923  Weight: 95.2 kg 90.7 kg 91.6 kg    General appearance: Awake alert.  In no distress Resp: Clear to auscultation bilaterally.  Normal effort Cardio: S1-S2 is normal regular.  No S3-S4.  No rubs murmurs or bruit GI: Abdomen is soft.  Nontender nondistended.  Bowel sounds are present normal.  No masses organomegaly Extremities: No edema.  Full range of motion of lower extremities.     Lab Results:  Data Reviewed: I have personally reviewed following labs and reports of the imaging studies  CBC: Recent Labs  Lab 11/10/21 1246 11/13/21 0435 11/14/21 0550 11/15/21 0302 11/16/21 0834  WBC 3.9* 5.8 7.6 5.6 3.6*  NEUTROABS  --  4.3  --  4.5  --   HGB 9.7* 8.9* 10.1* 9.5* 9.0*  HCT 28.1* 26.6* 31.0* 29.1* 26.8*  MCV 91.8 92.0 92.5 93.9 93.1  PLT 79* 89*  108* 82* 90*     Basic Metabolic Panel: Recent Labs  Lab 11/10/21 1246 11/13/21 0435 11/14/21 0550 11/15/21 0302 11/16/21 0834  NA 134* 130* 130* 130* 131*  K 4.5 4.8 4.9 4.8 4.1  CL 90* 89* 90* 91* 91*  CO2 '27 24 26 24 27  '$ GLUCOSE 120* 98 114* 87 139*  BUN 56* 64* 38* 59* 41*  CREATININE 12.63* 13.05* 9.05* 12.36* 9.13*  CALCIUM 9.9 9.0 9.6 9.5 9.0  MG  --  2.6* 2.2 2.3  --   PHOS 6.4*  --  5.6* 6.3* 6.1*     GFR: Estimated Creatinine Clearance: 9.4 mL/min (  A) (by C-G formula based on SCr of 9.13 mg/dL (H)).  Liver Function Tests: Recent Labs  Lab 11/10/21 1246 11/15/21 0302 11/16/21 0834  AST  --  15  --   ALT  --  11  --   ALKPHOS  --  63  --   BILITOT  --  1.6*  --   PROT  --  6.5  --   ALBUMIN 3.2* 3.2* 2.9*       Coagulation Profile: Recent Labs  Lab 11/15/21 0302  INR 1.2      CBG: Recent Labs  Lab 11/14/21 0836  GLUCAP 100*      Radiology Studies: ECHOCARDIOGRAM COMPLETE  Result Date: 11/16/2021    ECHOCARDIOGRAM REPORT   Patient Name:   Kirk [name] Date of Exam: 11/16/2021 Medical Rec #:  [id number]       Height:       67.0 in Accession #:    [id number]      Weight:       200.0 lb Date of Birth:  22-Dec-1960        BSA:          2.022 m Patient Age:    53 years        BP:           175/70 mmHg Patient Gender: M               HR:           61 bpm. Exam Location:  Inpatient Procedure: 2D Echo, Color Doppler and Cardiac Doppler Indications:    I48.91* Unspecified atrial fibrillation  History:        Patient has prior history of Echocardiogram examinations, most                 recent 09/08/2020. Risk Factors:Hypertension, Diabetes,                 Dyslipidemia and Sleep Apnea.  Sonographer:    Raquel Sarna Senior RDCS Referring Phys: [place]  1. Left ventricular ejection fraction, by estimation, is 50 to 55%. The left ventricle has low normal function. The left ventricle has no regional wall motion abnormalities. Left  ventricular diastolic parameters are consistent with Grade I diastolic dysfunction (impaired relaxation).  2. Right ventricular systolic function is normal. The right ventricular size is normal. Tricuspid regurgitation signal is inadequate for assessing PA pressure.  3. Left atrial size was mildly dilated.  4. The mitral valve is normal in structure. Trivial mitral valve regurgitation. No evidence of mitral stenosis.  5. The aortic valve is normal in structure. Aortic valve regurgitation is not visualized. Mild aortic valve stenosis. Aortic valve area, by VTI measures 1.69 cm. Aortic valve mean gradient measures 8.0 mmHg. Aortic valve Vmax measures 1.92 m/s.  6. The inferior vena cava is normal in size with greater than 50% respiratory variability, suggesting right atrial pressure of 3 mmHg. FINDINGS  Left Ventricle: Left ventricular ejection fraction, by estimation, is 50 to 55%. The left ventricle has low normal function. The left ventricle has no regional wall motion abnormalities. The left ventricular internal cavity size was normal in size. There is no left ventricular hypertrophy. Left ventricular diastolic parameters are consistent with Grade I diastolic dysfunction (impaired relaxation). Right Ventricle: The right ventricular size is normal. No increase in right ventricular wall thickness. Right ventricular systolic function is normal. Tricuspid regurgitation signal is inadequate for assessing PA pressure. Left Atrium: Left atrial size was  mildly dilated. Right Atrium: Right atrial size was normal in size. Pericardium: There is no evidence of pericardial effusion. Presence of epicardial fat layer. Mitral Valve: The mitral valve is normal in structure. Trivial mitral valve regurgitation. No evidence of mitral valve stenosis. Tricuspid Valve: The tricuspid valve is normal in structure. Tricuspid valve regurgitation is not demonstrated. No evidence of tricuspid stenosis. Aortic Valve: The aortic valve is  normal in structure. Aortic valve regurgitation is not visualized. Mild aortic stenosis is present. Aortic valve mean gradient measures 8.0 mmHg. Aortic valve peak gradient measures 14.7 mmHg. Aortic valve area, by VTI measures 1.69 cm. Pulmonic Valve: The pulmonic valve was normal in structure. Pulmonic valve regurgitation is not visualized. No evidence of pulmonic stenosis. Aorta: The aortic root is normal in size and structure. Venous: The inferior vena cava is normal in size with greater than 50% respiratory variability, suggesting right atrial pressure of 3 mmHg. IAS/Shunts: No atrial level shunt detected by color flow Doppler.  LEFT VENTRICLE PLAX 2D LVIDd:         5.70 cm   Diastology LVIDs:         4.10 cm   LV e' medial:    6.42 cm/s LV PW:         1.20 cm   LV E/e' medial:  19.0 LV IVS:        0.90 cm   LV e' lateral:   7.07 cm/s LVOT diam:     2.20 cm   LV E/e' lateral: 17.3 LV SV:         76 LV SV Index:   38 LVOT Area:     3.80 cm  RIGHT VENTRICLE RV S prime:     13.40 cm/s TAPSE (M-mode): 2.4 cm LEFT ATRIUM             Index        RIGHT ATRIUM           Index LA diam:        4.40 cm 2.18 cm/m   RA Area:     18.30 cm LA Vol (A2C):   72.9 ml 36.03 ml/m  RA Volume:   50.20 ml  24.83 ml/m LA Vol (A4C):   63.6 ml 31.45 ml/m LA Biplane Vol: 69.9 ml 34.57 ml/m  AORTIC VALVE AV Area (Vmax):    1.69 cm AV Area (Vmean):   1.89 cm AV Area (VTI):     1.69 cm AV Vmax:           192.00 cm/s AV Vmean:          133.000 cm/s AV VTI:            0.452 m AV Peak Grad:      14.7 mmHg AV Mean Grad:      8.0 mmHg LVOT Vmax:         85.40 cm/s LVOT Vmean:        66.000 cm/s LVOT VTI:          0.201 m LVOT/AV VTI ratio: 0.44  AORTA Ao Root diam: 3.10 cm Ao Asc diam:  3.50 cm MITRAL VALVE MV Area (PHT): 3.61 cm     SHUNTS MV Decel Time: 210 msec     Systemic VTI:  0.20 m MV E velocity: 122.00 cm/s  Systemic Diam: 2.20 cm MV A velocity: 95.40 cm/s MV E/A ratio:  1.28 Kardie Tobb DO Electronically signed by Berniece Salines DO Signature Date/Time: 11/16/2021/4:05:41 PM  Final    MR BRAIN WO CONTRAST  Result Date: 11/16/2021 CLINICAL DATA:  Seizure EXAM: MRI HEAD WITHOUT CONTRAST TECHNIQUE: Multiplanar, multiecho pulse sequences of the brain and surrounding structures were obtained without intravenous contrast. COMPARISON:  No prior MRI, correlation is made with CT head 11/14/2021 FINDINGS: Brain: No restricted diffusion to suggest acute or subacute infarct. Redemonstrated subdural collection along the right cerebral convexity, which measures up to 10 mm, similar to the prior CT. Fluid-fluid levels within the collection, with loculations, consistent with acute on chronic subdural hematoma. This causes some sulcal effacement along the right cerebral hemisphere, with 2 mm right-to-left midline shift. Mild mass effect on the right lateral ventricle. No hydrocephalus. Additional smaller collections along the left cerebral convexity, primarily about the left temporal and occipital lobe, measuring up to 2 mm (series 11, image 11), along the right tentorial leaflet, measuring 2 mm (series 25, image 7), along the right inferior temporal lobe (series 25, image 28), measuring approximately 1 mm, and along the medial aspect of the right temporal lobe, measuring 5 mm (series 25, image 33). No acute parenchymal hemorrhage or mass. Scattered T2 hyperintense signal in the periventricular white matter, likely the sequela of mild chronic small vessel ischemic disease. Evaluation is somewhat limited by motion artifact. Within this limitation, the hippocampi are symmetric in size and signal. No heterotopia or evidence of cortical dysgenesis. Vascular: Normal flow voids. Skull and upper cervical spine: Normal marrow signal. Sinuses/Orbits: No acute finding. Other: The mastoids are well aerated. IMPRESSION: 1. Redemonstrated acute on chronic subdural hematoma along the right cerebral convexity, measuring up to 10 mm, with some sulcal effacement  and 2 mm right-to-left midline shift. 2. Additional smaller subdural collections along the left cerebral convexity, along the right tentorial leaflet, and along the right inferior temporal lobe, measuring up to 5 mm. 3. No additional acute intracranial process. No seizure etiology identified. Electronically Signed   By: Merilyn Baba M.D.   On: 11/16/2021 02:58       LOS: 9 days   Joelynn Dust [employer] on [email or web address]  11/17/2021, 10:10 AM

## 2021-11-17 NOTE — Care Management Important Message (Signed)
Important Message  Patient Details  Name: Kirk [name] MRN: [id number] Date of Birth: 09-28-1960   Medicare Important Message Given:  Yes     Hannah Beat 11/17/2021, 2:54 PM

## 2021-11-17 NOTE — Progress Notes (Signed)
Inpatient Rehab Admissions Coordinator:   Following patient for potential CIR admission. Patient is not medically stable at this time. Will continue to follow.  Rehab Admissons Coordinator [place], [place], [place] [phone]

## 2021-11-17 NOTE — Progress Notes (Signed)
Nephrology progress note:  Subjective:  last HD on 10/25 with 3.5 kg UF.  Hypertensive.  He's about to be taken up to dialysis.   Review of systems:     Still with headache reports Nausea and vomiting overnight Reports some shortness of breath  Objective Vital signs in last 24 hours: Vitals:   11/16/21 2000 11/17/21 0000 11/17/21 0400 11/17/21 0820  BP: (!) 173/64 (!) 182/67 (!) 188/75 (!) 197/69  Pulse: 70 65 62 69  Resp: 18   16  Temp: 98.1 F (36.7 C) 98 F (36.7 C) 98.1 F (36.7 C)   TempSrc: Oral Oral Oral Oral  SpO2: 97% 99% 99% 98%  Weight:      Height:       Weight change:  No intake or output data in the 24 hours ending 11/17/21 0849    Dialysis Orders:  [facility] MWF  4 hrs 15 min, BFR 450/AF 1.5  EDW 89.9, 2 K/ 2 Ca, UFP 4   Access: LU AVF  Hep 2500   STOP Heparin (has spontaneous subdural hematoma) Sensipar 60 mg qHD Calcitriol 1.25 mcg PO qHD   Assessment/Plan: New onset seizure - Possibly 2/2 Right SDH per neurology. Neurology following. Now back on keppra and off depakote. Seizure/fall precautions.   Subdural hematoma - Repeat head CT with concern for interval bleed. Neurosurgery had recommended conservative management with outpatient follow-up with them in clinic ESRD -  On dialysis MWF.  No Heparin with HD. Fever - abx per primary team.   Hypokalemia - resolved and now on 2K bath here   Hypertension/volume  - per primary team; setting of intracranial bleed.  optimize volume with HD.  In the past has missed meds due to HD and seizure/ams.  Had previously moved amlodipine to evening dosing to avoid holding it for HD - was stopped due to getting dilt for rapid afib but now back on.  We have started losartan - will increase to 50 mg daily and move to evening dosing so that it isn't held for HD. PRN's available but not always getting.  Start hydralazine 25 mg TID  Anemia of CKD - previously without indication for ESA now with subdural hematoma and uncontrolled HTN.   For now follow trends  Secondary Hyperparathyroidism - when taking PO on calcitriol. Sevelamer with meals OSA on CPAP - CPAP at night Chronic thrombocytopenia  - stable Afib with RVR - rapid afib s/p dilt on 10/25. Now off.  Per primary team. On beta blocker  Disposition per primary team    Labs: Basic Metabolic Panel: Recent Labs  Lab 11/14/21 0550 11/15/21 0302 11/16/21 0834  NA 130* 130* 131*  K 4.9 4.8 4.1  CL 90* 91* 91*  CO2 '26 24 27  '$ GLUCOSE 114* 87 139*  BUN 38* 59* 41*  CREATININE 9.05* 12.36* 9.13*  CALCIUM 9.6 9.5 9.0  PHOS 5.6* 6.3* 6.1*   Liver Function Tests: Recent Labs  Lab 11/10/21 1246 11/15/21 0302 11/16/21 0834  AST  --  15  --   ALT  --  11  --   ALKPHOS  --  63  --   BILITOT  --  1.6*  --   PROT  --  6.5  --   ALBUMIN 3.2* 3.2* 2.9*   No results for input(s): "LIPASE", "AMYLASE" in the last 168 hours. No results for input(s): "AMMONIA" in the last 168 hours. CBC: Recent Labs  Lab 11/10/21 1246 11/13/21 0435 11/14/21 0550 11/15/21 0302 11/16/21 2536  WBC 3.9* 5.8 7.6 5.6 3.6*  NEUTROABS  --  4.3  --  4.5  --   HGB 9.7* 8.9* 10.1* 9.5* 9.0*  HCT 28.1* 26.6* 31.0* 29.1* 26.8*  MCV 91.8 92.0 92.5 93.9 93.1  PLT 79* 89* 108* 82* 90*   Cardiac Enzymes: No results for input(s): "CKTOTAL", "CKMB", "CKMBINDEX", "TROPONINI" in the last 168 hours. CBG: Recent Labs  Lab 11/14/21 0836  GLUCAP 100*    Iron Studies: No results for input(s): "IRON", "TIBC", "TRANSFERRIN", "FERRITIN" in the last 72 hours. Studies/Results: ECHOCARDIOGRAM COMPLETE  Result Date: 11/16/2021    ECHOCARDIOGRAM REPORT   Patient Name:   WACEY ZIEGER Date of Exam: 11/16/2021 Medical Rec #:  [id number]       Height:       67.0 in Accession #:    [id number]      Weight:       200.0 lb Date of Birth:  May 07, 1960        BSA:          2.022 m Patient Age:    17 years        BP:           175/70 mmHg Patient Gender: M               HR:           61 bpm. Exam Location:   Inpatient Procedure: 2D Echo, Color Doppler and Cardiac Doppler Indications:    I48.91* Unspecified atrial fibrillation  History:        Patient has prior history of Echocardiogram examinations, most                 recent 09/08/2020. Risk Factors:Hypertension, Diabetes,                 Dyslipidemia and Sleep Apnea.  Sonographer:    Raquel Sarna Senior RDCS Referring Phys: [place]  1. Left ventricular ejection fraction, by estimation, is 50 to 55%. The left ventricle has low normal function. The left ventricle has no regional wall motion abnormalities. Left ventricular diastolic parameters are consistent with Grade I diastolic dysfunction (impaired relaxation).  2. Right ventricular systolic function is normal. The right ventricular size is normal. Tricuspid regurgitation signal is inadequate for assessing PA pressure.  3. Left atrial size was mildly dilated.  4. The mitral valve is normal in structure. Trivial mitral valve regurgitation. No evidence of mitral stenosis.  5. The aortic valve is normal in structure. Aortic valve regurgitation is not visualized. Mild aortic valve stenosis. Aortic valve area, by VTI measures 1.69 cm. Aortic valve mean gradient measures 8.0 mmHg. Aortic valve Vmax measures 1.92 m/s.  6. The inferior vena cava is normal in size with greater than 50% respiratory variability, suggesting right atrial pressure of 3 mmHg. FINDINGS  Left Ventricle: Left ventricular ejection fraction, by estimation, is 50 to 55%. The left ventricle has low normal function. The left ventricle has no regional wall motion abnormalities. The left ventricular internal cavity size was normal in size. There is no left ventricular hypertrophy. Left ventricular diastolic parameters are consistent with Grade I diastolic dysfunction (impaired relaxation). Right Ventricle: The right ventricular size is normal. No increase in right ventricular wall thickness. Right ventricular systolic function is normal.  Tricuspid regurgitation signal is inadequate for assessing PA pressure. Left Atrium: Left atrial size was mildly dilated. Right Atrium: Right atrial size was normal in size. Pericardium: There is no evidence of pericardial effusion. Presence  of epicardial fat layer. Mitral Valve: The mitral valve is normal in structure. Trivial mitral valve regurgitation. No evidence of mitral valve stenosis. Tricuspid Valve: The tricuspid valve is normal in structure. Tricuspid valve regurgitation is not demonstrated. No evidence of tricuspid stenosis. Aortic Valve: The aortic valve is normal in structure. Aortic valve regurgitation is not visualized. Mild aortic stenosis is present. Aortic valve mean gradient measures 8.0 mmHg. Aortic valve peak gradient measures 14.7 mmHg. Aortic valve area, by VTI measures 1.69 cm. Pulmonic Valve: The pulmonic valve was normal in structure. Pulmonic valve regurgitation is not visualized. No evidence of pulmonic stenosis. Aorta: The aortic root is normal in size and structure. Venous: The inferior vena cava is normal in size with greater than 50% respiratory variability, suggesting right atrial pressure of 3 mmHg. IAS/Shunts: No atrial level shunt detected by color flow Doppler.  LEFT VENTRICLE PLAX 2D LVIDd:         5.70 cm   Diastology LVIDs:         4.10 cm   LV e' medial:    6.42 cm/s LV PW:         1.20 cm   LV E/e' medial:  19.0 LV IVS:        0.90 cm   LV e' lateral:   7.07 cm/s LVOT diam:     2.20 cm   LV E/e' lateral: 17.3 LV SV:         76 LV SV Index:   38 LVOT Area:     3.80 cm  RIGHT VENTRICLE RV S prime:     13.40 cm/s TAPSE (M-mode): 2.4 cm LEFT ATRIUM             Index        RIGHT ATRIUM           Index LA diam:        4.40 cm 2.18 cm/m   RA Area:     18.30 cm LA Vol (A2C):   72.9 ml 36.03 ml/m  RA Volume:   50.20 ml  24.83 ml/m LA Vol (A4C):   63.6 ml 31.45 ml/m LA Biplane Vol: 69.9 ml 34.57 ml/m  AORTIC VALVE AV Area (Vmax):    1.69 cm AV Area (Vmean):   1.89 cm AV  Area (VTI):     1.69 cm AV Vmax:           192.00 cm/s AV Vmean:          133.000 cm/s AV VTI:            0.452 m AV Peak Grad:      14.7 mmHg AV Mean Grad:      8.0 mmHg LVOT Vmax:         85.40 cm/s LVOT Vmean:        66.000 cm/s LVOT VTI:          0.201 m LVOT/AV VTI ratio: 0.44  AORTA Ao Root diam: 3.10 cm Ao Asc diam:  3.50 cm MITRAL VALVE MV Area (PHT): 3.61 cm     SHUNTS MV Decel Time: 210 msec     Systemic VTI:  0.20 m MV E velocity: 122.00 cm/s  Systemic Diam: 2.20 cm MV A velocity: 95.40 cm/s MV E/A ratio:  1.28 Kardie Tobb DO Electronically signed by Berniece Salines DO Signature Date/Time: 11/16/2021/4:05:41 PM    Final    MR BRAIN WO CONTRAST  Result Date: 11/16/2021 CLINICAL DATA:  Seizure EXAM: MRI HEAD WITHOUT CONTRAST  TECHNIQUE: Multiplanar, multiecho pulse sequences of the brain and surrounding structures were obtained without intravenous contrast. COMPARISON:  No prior MRI, correlation is made with CT head 11/14/2021 FINDINGS: Brain: No restricted diffusion to suggest acute or subacute infarct. Redemonstrated subdural collection along the right cerebral convexity, which measures up to 10 mm, similar to the prior CT. Fluid-fluid levels within the collection, with loculations, consistent with acute on chronic subdural hematoma. This causes some sulcal effacement along the right cerebral hemisphere, with 2 mm right-to-left midline shift. Mild mass effect on the right lateral ventricle. No hydrocephalus. Additional smaller collections along the left cerebral convexity, primarily about the left temporal and occipital lobe, measuring up to 2 mm (series 11, image 11), along the right tentorial leaflet, measuring 2 mm (series 25, image 7), along the right inferior temporal lobe (series 25, image 28), measuring approximately 1 mm, and along the medial aspect of the right temporal lobe, measuring 5 mm (series 25, image 33). No acute parenchymal hemorrhage or mass. Scattered T2 hyperintense signal in the  periventricular white matter, likely the sequela of mild chronic small vessel ischemic disease. Evaluation is somewhat limited by motion artifact. Within this limitation, the hippocampi are symmetric in size and signal. No heterotopia or evidence of cortical dysgenesis. Vascular: Normal flow voids. Skull and upper cervical spine: Normal marrow signal. Sinuses/Orbits: No acute finding. Other: The mastoids are well aerated. IMPRESSION: 1. Redemonstrated acute on chronic subdural hematoma along the right cerebral convexity, measuring up to 10 mm, with some sulcal effacement and 2 mm right-to-left midline shift. 2. Additional smaller subdural collections along the left cerebral convexity, along the right tentorial leaflet, and along the right inferior temporal lobe, measuring up to 5 mm. 3. No additional acute intracranial process. No seizure etiology identified. Electronically Signed   By: Merilyn Baba M.D.   On: 11/16/2021 02:58   Overnight EEG with video  Result Date: 11/15/2021 [name], MD     11/15/2021  9:35 AM Patient Name: Kirk [name] MRN: [id number] Epilepsy Attending: [name] Referring Physician/Provider: Rikki Spearing, NP Duration: 11/14/2021 1034 to 11/15/2021 0758  Patient history: 61yo M with new onset seizures. EEG to evaluate for seizure.  Level of alertness: Awake, asleep  AEDs during EEG study: LEV  Technical aspects: This EEG study was done with scalp electrodes positioned according to the 10-20 International system of electrode placement. Electrical activity was reviewed with band pass filter of 1-'70Hz'$ , sensitivity of 7 uV/mm, display speed of 14m/sec with a '60Hz'$  notched filter applied as appropriate. EEG data were recorded continuously and digitally stored.  Video monitoring was available and reviewed as appropriate.  Description: At the beginning of the study, EEG showed continuous generalized and lateralized right hemisphere 3-6 theta-delta slowing.  Gradually, EEG  improved and showed  posterior dominant rhythm of 8-'9Hz'$  activity of moderate voltage (25-35 uV) seen predominantly in posterior head regions, symmetric and reactive to eye opening and eye closing. Sleep was characterized by vertex waves, sleep spindles (12 to 14 Hz), maximal frontocentral region.  EEG also showed intermittent generalized and lateralized right hemisphere 3 to 6 Hz theta-delta slowing. Hyperventilation and photic stimulation were not performed.    ABNORMALITY - Continuous slow, generalized and lateralized right hemisphere  IMPRESSION: This study was initially suggestive of cortical dysfunction arising from right hemisphere likely secondary to postictal state as well as moderate to severe diffuse encephalopathy.  Gradually, EEG improved and was suggestive of mild to moderate diffuse encephalopathy, nonspecific etiology.  No seizures or epileptiform discharges were seen throughout the recording.  Please note lack of epileptiform activity does not exclude the diagnosis of epilepsy.   Priyanka Barbra Sarks    Medications: Infusions:  cefTRIAXone (ROCEPHIN)  IV 1 g (11/16/21 1545)   levETIRAcetam Stopped (11/15/21 2037)   levETIRAcetam 500 mg (11/17/21 8099)   metronidazole 500 mg (11/17/21 0455)   vancomycin 750 mg (11/15/21 1817)     Scheduled Medications:  amLODipine  10 mg Oral Daily   calcitRIOL  1 mcg Oral Q M,W,F-HD   Chlorhexidine Gluconate Cloth  6 each Topical Q0600   Chlorhexidine Gluconate Cloth  6 each Topical Q0600   cholecalciferol  4,000 Units Oral Daily   doxazosin  4 mg Oral Daily   guaiFENesin  1,200 mg Oral BID   isosorbide mononitrate  30 mg Oral Daily   losartan  50 mg Oral QPM   methocarbamol  750 mg Oral QID   metoprolol tartrate  100 mg Oral BID   multivitamin  1 tablet Oral Daily   pantoprazole  40 mg Oral BID AC   pravastatin  40 mg Oral Daily   pregabalin  25 mg Oral Daily   senna-docusate  1 tablet Oral BID   sevelamer carbonate  3,200 mg Oral TID WC     have reviewed scheduled and prn medications.  Physical Exam:      General adult male in bed in NAD HEENT normocephalic atraumatic sclera anicteric Neck supple trachea midline Lungs clear to auscultation bilaterally normal work of breathing at rest  Heart S1S2 no rub Abdomen soft nontender nondistended Extremities no edema  Psych normal mood and affect Neuro - wakes to voice; alert and conversant; follows commands Access LUE AVF bruit and thrill    Claudia Desanctis, MD 11/17/2021, 9:00 AM

## 2021-11-17 NOTE — Progress Notes (Signed)
Pts sbp consistantly in the 220"s. Provider did give order x1 for Clonidine 0.'1mg'$  x1. Will continue to monitor b/p.

## 2021-11-17 NOTE — Procedures (Signed)
Seen and examined on dialysis.  Blood pressure 210/87 and HR 66.  AVF in use.  Procedure supervised. Tolerating goal.  We increased his UF goal to 3 kg and I ordered clonidine stat (still not here).   Claudia Desanctis, MD 11/17/2021   10:31 AM

## 2021-11-17 NOTE — Progress Notes (Signed)
Received patient in bed to unit.  Alert and oriented.  Informed consent signed and in chart.   Treatment initiated: 0923 Treatment completed: 1358  Patient tolerated well.  Transported back to the room  Alert, without acute distress.  Hand-off given to patient's nurse.   Access used: [email or web address] Access issues: None  Total UF removed: 3.0 Medication(s) given: Clonidine 0.'1mg'$ ,Vanc Post HD VS: 165/81,73,14,94% Post HD weight: 100.8kg   [facility]

## 2021-11-18 DIAGNOSIS — S065XAA Traumatic subdural hemorrhage with loss of consciousness status unknown, initial encounter: Secondary | ICD-10-CM | POA: Diagnosis not present

## 2021-11-18 DIAGNOSIS — I10 Essential (primary) hypertension: Secondary | ICD-10-CM | POA: Diagnosis not present

## 2021-11-18 DIAGNOSIS — R569 Unspecified convulsions: Secondary | ICD-10-CM | POA: Diagnosis not present

## 2021-11-18 DIAGNOSIS — R509 Fever, unspecified: Secondary | ICD-10-CM | POA: Diagnosis not present

## 2021-11-18 LAB — GLUCOSE, CAPILLARY: Glucose-Capillary: 127 mg/dL — ABNORMAL HIGH (ref 70–99)

## 2021-11-18 MED ORDER — AMLODIPINE BESYLATE 10 MG PO TABS
10.0000 mg | ORAL_TABLET | Freq: Every evening | ORAL | Status: DC
  Administered 2021-11-18 – 2021-11-20 (×3): 10 mg via ORAL
  Filled 2021-11-18 (×3): qty 1

## 2021-11-18 MED ORDER — HYDRALAZINE HCL 25 MG PO TABS
25.0000 mg | ORAL_TABLET | Freq: Once | ORAL | Status: AC
  Administered 2021-11-18: 25 mg via ORAL
  Filled 2021-11-18: qty 1

## 2021-11-18 MED ORDER — CLONIDINE HCL 0.1 MG PO TABS
0.1000 mg | ORAL_TABLET | Freq: Once | ORAL | Status: AC
  Administered 2021-11-18: 0.1 mg via ORAL
  Filled 2021-11-18: qty 1

## 2021-11-18 MED ORDER — HYDRALAZINE HCL 50 MG PO TABS
50.0000 mg | ORAL_TABLET | Freq: Three times a day (TID) | ORAL | Status: DC
  Administered 2021-11-18 – 2021-11-20 (×6): 50 mg via ORAL
  Filled 2021-11-18 (×6): qty 1

## 2021-11-18 NOTE — Progress Notes (Signed)
[facility] PROGRESS NOTE   Kirk [name] PFX:[id number] DOB: 13-Nov-1960 DOA: 11/07/2021  PCP: Sandrea Hughs, NP  Brief History/Interval Summary: 61 year old male with PMH significant of end-stage renal disease on hemodialysis, Alport syndrome, hypertension, chronic thrombocytopenia, anemia of chronic disease, chronic headaches, hyperlipidemia, OSA on CPAP, pancytopenia, diabetes mellitus type 2, memory impairment, chronic diastolic CHF, chronic bilateral upper extremity and lower extremity weakness since C-spine surgery in February 2023 presented with new onset seizure at home lasting for 2 minutes. On presentation, CT of the head showed subdural hematoma.  Neurology and Neurosurgery were consulted.  He was started on Keppra.  Keppra was subsequently switched to Depakote on 11/11/2021 because of persistent headaches. Later Depakote was discontinued and placed back on Keppra.  10/24: Patient has developed right-sided gaze and left neglect.  Code stroke called.  CT head: No stroke, not a candidate for tPA, LT EEG connected.  Consultants: Neurology.  Neurosurgery  Procedures: EEG with long-term monitoring    Subjective/Interval History: Patient mentions that this nausea and vomiting has improved.  Still with occasional episodes of nausea but has been able to tolerate supper last night and breakfast this morning.  Headache persists but stable.     Assessment/Plan:  Fever and tachycardia/bacteremia Noted to have a fever of 100.9 F on 10/24.   Concern for SIRS.  No clear source identified. Work-up was initiated.  Patient placed on vancomycin cefepime and Flagyl.  Due to ongoing concern for seizure activity cefepime was discontinued and changed over to ceftriaxone.   Lactic acid level normal.  Procalcitonin 0.58.  WBC is normal. Chest x-ray noted to be abnormal but more suggestive of interstitial edema rather than infectious infiltrates.  No cough mentioned by patient. Has not had  further episodes of fever. 2 out of 4 blood cultures from 10/25 positive for Staph epidermidis.  Likely a contaminant.  Blood cultures were repeated on 10/26.  Negative so far. Metronidazole was discontinued.  Will discontinue vancomycin.  Continue ceftriaxone for now.  Nausea and vomiting Could be due to elevated blood pressure and headaches.   Patient was dialyzed yesterday.  Blood pressure noted to be improved yesterday evening.  Symptoms have improved.  Abdomen remains benign.  Continue to monitor for now.   Paroxysmal atrial fibrillation with RVR  Patient returned from dialysis on 11/15/21 and was noted to be tachycardic.  EKG showed atrial fibrillation with RVR.  Patient was given a dose of Cardizem.  He had not received his metoprolol over 36 hours due to alteration in mental status and inability to take orally.   Metoprolol was resumed. Patient converted back to sinus rhythm within an hour.  Remains in sinus rhythm. Atrial fibrillation likely provoked due to inability to take medications as well as volume removal with dialysis. Due to subdural hematoma he is currently not a candidate for anticoagulation.  Will benefit from outpatient follow-up with primary care provider and perhaps referral to cardiology at some point in time.   TSH and free T4 noted to be normal.  Echocardiogram shows normal systolic function.  New onset seizures: Possibly due to right subdural hematoma as per neurology. Patient was started on Keppra,  renally dosed. Continue seizure/fall precautions. Keppra was switched to Depakote on 10/21 due to persistent headache after discussion with neurology. Later Neurology stated higher risk of bleeding and increased mortality with Depakote. Patient was placed back on Keppra 10/23. Depakote discontinued. Now patient is only on Keppra.  Management per neurology. Neurological status is stable  for the most part.   Subdural hematoma: Repeat CT head still showed some subdural  hematoma. Neurosurgery recommended conservative management. Repeat CT head on 10/21 showed trace increase in subdural hemorrhage along the right tentorium. Patient was reevaluated by neurosurgery Dr. Kathyrn Sheriff.  Neurology started Lyrica for headaches. He reports headache has improved though it persists. MRI from 10/26 suggests rebleeding.  She was evaluated by Dr. Reatha Armour with neurosurgery.  No indication for any surgical intervention at this time.  PT and OT evaluation.  Inpatient rehabilitation is recommended.    Accelerated hypertension It has been very challenging to treat his hypertension.   He is on multiple antihypertensives including amlodipine 10 mg every evening, doxazosin 4 mg daily, isosorbide mononitrate 30 mg daily, losartan 50 mg every evening, metoprolol 100 mg twice a day, hydralazine 25 mg 3 times a day.  We will see what the trends are after he has been given his a.m. medications.  May need further titration of his antihypertensives.   Left Neglect / Right gaze: 10/24: Patient was noted to have right gaze and left neglect.  Code stroke was called. CT head : No new hemorrhage, CTA is negative for LVO. Likely secondary to seizure activity. Resolved.   End-stage renal disease on hemodialysis/hyponatremia Nephrology following, continue dialysis as per schedule.  Currently on a Monday Wednesday Friday schedule.   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   OSA on CPAP: Continue CPAP at night.   Anemia of chronic disease: H&H remains stable.  Monitor intermittently.   Chronic thrombocytopenia: Platelets are stable, monitor intermittently.   GERD: Continue pantoprazole.   Hyperlipidemia Continue statins   Diabetes mellitus type 2: Stable for the most part.   Obesity: Diet and exercise discussed in detail. Estimated body mass index is 33.18 kg/m as calculated from the following:   Height as of this encounter: '5\' 7"'$  (1.702  m).   Weight as of this encounter: 96.1 kg.    Chronic diastolic CHF: Volume is managed by dialysis. Daily weight, intake output charting, fluid restriction   Physical deconditioning: Inpatient rehabilitation not recommended.  DVT Prophylaxis: SCDs Code Status: Full code Family Communication: Discussed with patient.  Discussed with his wife over the phone Disposition Plan: Seen by PT and OT.  Inpatient rehabilitation is recommended.  Status is: Inpatient Remains inpatient appropriate because: Seizure activity      Medications: Scheduled:  amLODipine  10 mg Oral QPM   calcitRIOL  1 mcg Oral Q M,W,F-HD   Chlorhexidine Gluconate Cloth  6 each Topical Q0600   Chlorhexidine Gluconate Cloth  6 each Topical Q0600   cholecalciferol  4,000 Units Oral Daily   doxazosin  4 mg Oral Daily   guaiFENesin  1,200 mg Oral BID   hydrALAZINE  25 mg Oral Q8H   isosorbide mononitrate  30 mg Oral Daily   losartan  50 mg Oral QPM   methocarbamol  750 mg Oral QID   metoprolol tartrate  100 mg Oral BID   multivitamin  1 tablet Oral Daily   pantoprazole  40 mg Oral BID AC   pravastatin  40 mg Oral Daily   pregabalin  25 mg Oral Daily   senna-docusate  1 tablet Oral BID   sevelamer carbonate  3,200 mg Oral TID WC   Continuous:  cefTRIAXone (ROCEPHIN)  IV 1 g (11/17/21 1604)   levETIRAcetam 250 mg (11/17/21 2032)   levETIRAcetam 500 mg (11/18/21 1191)   metronidazole 500 mg (11/18/21 0319)  vancomycin Stopped (11/17/21 1235)   GMW:NUUVOZDGUYQIH **OR** acetaminophen, bisacodyl, cloNIDine, HYDROmorphone (DILAUDID) injection, labetalol, metoCLOPramide (REGLAN) injection, nitroGLYCERIN, ondansetron **OR** ondansetron (ZOFRAN) IV, mouth rinse, oxyCODONE, polyethylene glycol, sevelamer carbonate, traZODone  Antibiotics: Anti-infectives (From admission, onward)    Start     Dose/Rate Route Frequency Ordered Stop   11/15/21 1800  ceFEPIme (MAXIPIME) 1 g in sodium chloride 0.9 % 100 mL IVPB   Status:  Discontinued        1 g 200 mL/hr over 30 Minutes Intravenous Every 24 hours 11/15/21 0106 11/15/21 1132   11/15/21 1600  cefTRIAXone (ROCEPHIN) 1 g in sodium chloride 0.9 % 100 mL IVPB        1 g 200 mL/hr over 30 Minutes Intravenous Every 24 hours 11/15/21 1132     11/15/21 1200  vancomycin (VANCOREADY) IVPB 750 mg/150 mL        750 mg 150 mL/hr over 60 Minutes Intravenous Every M-W-F (Hemodialysis) 11/15/21 0106     11/15/21 0200  vancomycin (VANCOREADY) IVPB 1500 mg/300 mL        1,500 mg 150 mL/hr over 120 Minutes Intravenous  Once 11/15/21 0106 11/15/21 1510   11/15/21 0200  ceFEPIme (MAXIPIME) 2 g in sodium chloride 0.9 % 100 mL IVPB        2 g 200 mL/hr over 30 Minutes Intravenous  Once 11/15/21 0106 11/15/21 1509   11/15/21 0115  metroNIDAZOLE (FLAGYL) IVPB 500 mg        500 mg 100 mL/hr over 60 Minutes Intravenous Every 12 hours 11/15/21 0057         Objective:  Vital Signs  Vitals:   11/17/21 2145 11/18/21 0000 11/18/21 0400 11/18/21 0726  BP:  (!) 172/65 (!) 158/68 (!) 195/73  Pulse: 70 68 66 70  Resp: 15   19  Temp:  98.4 F (36.9 C) 98.3 F (36.8 C) 98 F (36.7 C)  TempSrc:  Oral Oral Oral  SpO2: 98% 97% 98% 98%  Weight:      Height:        Intake/Output Summary (Last 24 hours) at 11/18/2021 1029 Last data filed at 11/17/2021 1711 Gross per 24 hour  Intake 40 ml  Output 3 ml  Net 37 ml    Filed Weights   11/15/21 0823 11/15/21 1245 11/17/21 0923  Weight: 95.2 kg 90.7 kg 91.6 kg    General appearance: Awake alert.  In no distress Resp: Clear to auscultation bilaterally.  Normal effort Cardio: S1-S2 is normal regular.  No S3-S4.  No rubs murmurs or bruit GI: Abdomen is soft.  Nontender nondistended.  Bowel sounds are present normal.  No masses organomegaly      Lab Results:  Data Reviewed: I have personally reviewed following labs and reports of the imaging studies  CBC: Recent Labs  Lab 11/13/21 0435 11/14/21 0550  11/15/21 0302 11/16/21 0834  WBC 5.8 7.6 5.6 3.6*  NEUTROABS 4.3  --  4.5  --   HGB 8.9* 10.1* 9.5* 9.0*  HCT 26.6* 31.0* 29.1* 26.8*  MCV 92.0 92.5 93.9 93.1  PLT 89* 108* 82* 90*     Basic Metabolic Panel: Recent Labs  Lab 11/13/21 0435 11/14/21 0550 11/15/21 0302 11/16/21 0834 11/17/21 1517  NA 130* 130* 130* 131* 134*  K 4.8 4.9 4.8 4.1 3.2*  CL 89* 90* 91* 91* 92*  CO2 '24 26 24 27 30  '$ GLUCOSE 98 114* 87 139* 123*  BUN 64* 38* 59* 41* 18  CREATININE 13.05* 9.05* 12.36*  9.13* 5.60*  CALCIUM 9.0 9.6 9.5 9.0 8.8*  MG 2.6* 2.2 2.3  --   --   PHOS  --  5.6* 6.3* 6.1* 3.6     GFR: Estimated Creatinine Clearance: 15.3 mL/min (A) (by C-G formula based on SCr of 5.6 mg/dL (H)).  Liver Function Tests: Recent Labs  Lab 11/15/21 0302 11/16/21 0834 11/17/21 1517  AST 15  --   --   ALT 11  --   --   ALKPHOS 63  --   --   BILITOT 1.6*  --   --   PROT 6.5  --   --   ALBUMIN 3.2* 2.9* 2.9*       Coagulation Profile: Recent Labs  Lab 11/15/21 0302  INR 1.2      CBG: Recent Labs  Lab 11/14/21 0836 11/18/21 0652  GLUCAP 100* 127*      Radiology Studies: ECHOCARDIOGRAM COMPLETE  Result Date: 11/16/2021    ECHOCARDIOGRAM REPORT   Patient Name:   Kirk [name] Date of Exam: 11/16/2021 Medical Rec #:  [id number]       Height:       67.0 in Accession #:    [id number]      Weight:       200.0 lb Date of Birth:  Sep 16, 1960        BSA:          2.022 m Patient Age:    72 years        BP:           175/70 mmHg Patient Gender: M               HR:           61 bpm. Exam Location:  Inpatient Procedure: 2D Echo, Color Doppler and Cardiac Doppler Indications:    I48.91* Unspecified atrial fibrillation  History:        Patient has prior history of Echocardiogram examinations, most                 recent 09/08/2020. Risk Factors:Hypertension, Diabetes,                 Dyslipidemia and Sleep Apnea.  Sonographer:    Raquel Sarna Senior RDCS Referring Phys: [place]  1. Left ventricular ejection fraction, by estimation, is 50 to 55%. The left ventricle has low normal function. The left ventricle has no regional wall motion abnormalities. Left ventricular diastolic parameters are consistent with Grade I diastolic dysfunction (impaired relaxation).  2. Right ventricular systolic function is normal. The right ventricular size is normal. Tricuspid regurgitation signal is inadequate for assessing PA pressure.  3. Left atrial size was mildly dilated.  4. The mitral valve is normal in structure. Trivial mitral valve regurgitation. No evidence of mitral stenosis.  5. The aortic valve is normal in structure. Aortic valve regurgitation is not visualized. Mild aortic valve stenosis. Aortic valve area, by VTI measures 1.69 cm. Aortic valve mean gradient measures 8.0 mmHg. Aortic valve Vmax measures 1.92 m/s.  6. The inferior vena cava is normal in size with greater than 50% respiratory variability, suggesting right atrial pressure of 3 mmHg. FINDINGS  Left Ventricle: Left ventricular ejection fraction, by estimation, is 50 to 55%. The left ventricle has low normal function. The left ventricle has no regional wall motion abnormalities. The left ventricular internal cavity size was normal in size. There is no left ventricular hypertrophy. Left ventricular diastolic parameters are consistent with  Grade I diastolic dysfunction (impaired relaxation). Right Ventricle: The right ventricular size is normal. No increase in right ventricular wall thickness. Right ventricular systolic function is normal. Tricuspid regurgitation signal is inadequate for assessing PA pressure. Left Atrium: Left atrial size was mildly dilated. Right Atrium: Right atrial size was normal in size. Pericardium: There is no evidence of pericardial effusion. Presence of epicardial fat layer. Mitral Valve: The mitral valve is normal in structure. Trivial mitral valve regurgitation. No evidence of mitral valve  stenosis. Tricuspid Valve: The tricuspid valve is normal in structure. Tricuspid valve regurgitation is not demonstrated. No evidence of tricuspid stenosis. Aortic Valve: The aortic valve is normal in structure. Aortic valve regurgitation is not visualized. Mild aortic stenosis is present. Aortic valve mean gradient measures 8.0 mmHg. Aortic valve peak gradient measures 14.7 mmHg. Aortic valve area, by VTI measures 1.69 cm. Pulmonic Valve: The pulmonic valve was normal in structure. Pulmonic valve regurgitation is not visualized. No evidence of pulmonic stenosis. Aorta: The aortic root is normal in size and structure. Venous: The inferior vena cava is normal in size with greater than 50% respiratory variability, suggesting right atrial pressure of 3 mmHg. IAS/Shunts: No atrial level shunt detected by color flow Doppler.  LEFT VENTRICLE PLAX 2D LVIDd:         5.70 cm   Diastology LVIDs:         4.10 cm   LV e' medial:    6.42 cm/s LV PW:         1.20 cm   LV E/e' medial:  19.0 LV IVS:        0.90 cm   LV e' lateral:   7.07 cm/s LVOT diam:     2.20 cm   LV E/e' lateral: 17.3 LV SV:         76 LV SV Index:   38 LVOT Area:     3.80 cm  RIGHT VENTRICLE RV S prime:     13.40 cm/s TAPSE (M-mode): 2.4 cm LEFT ATRIUM             Index        RIGHT ATRIUM           Index LA diam:        4.40 cm 2.18 cm/m   RA Area:     18.30 cm LA Vol (A2C):   72.9 ml 36.03 ml/m  RA Volume:   50.20 ml  24.83 ml/m LA Vol (A4C):   63.6 ml 31.45 ml/m LA Biplane Vol: 69.9 ml 34.57 ml/m  AORTIC VALVE AV Area (Vmax):    1.69 cm AV Area (Vmean):   1.89 cm AV Area (VTI):     1.69 cm AV Vmax:           192.00 cm/s AV Vmean:          133.000 cm/s AV VTI:            0.452 m AV Peak Grad:      14.7 mmHg AV Mean Grad:      8.0 mmHg LVOT Vmax:         85.40 cm/s LVOT Vmean:        66.000 cm/s LVOT VTI:          0.201 m LVOT/AV VTI ratio: 0.44  AORTA Ao Root diam: 3.10 cm Ao Asc diam:  3.50 cm MITRAL VALVE MV Area (PHT): 3.61 cm     SHUNTS MV  Decel Time: 210 msec  Systemic VTI:  0.20 m MV E velocity: 122.00 cm/s  Systemic Diam: 2.20 cm MV A velocity: 95.40 cm/s MV E/A ratio:  1.28 Kardie Tobb DO Electronically signed by Berniece Salines DO Signature Date/Time: 11/16/2021/4:05:41 PM    Final        LOS: 10 days   [facility] Pager on [email or web address]  11/18/2021, 10:29 AM

## 2021-11-18 NOTE — Progress Notes (Signed)
Pt placed on cpap for the night and seems to be tolerating it well at this time.  

## 2021-11-18 NOTE — Progress Notes (Signed)
Occupational Therapy Treatment Patient Details Name: Kirk [name] MRN: [id number] DOB: [birth year] Today's Date: 11/18/2021   History of present illness Pt is a 61 y.o. male who presented to the ED 10/17 with seizure-like activity. CT revealed R frontal SDH. Near-syncopal-type episode during therapy 10/21, orthostatics stable. PMH:  ESRD on HD, anemia of CKD, carpal tunnel syndrome, chronic headaches, HTN, gout, chronic bilateral upper and lower extremity weakness due to cervical spine pathology (c spine sx 06/2012 and 02/2021), prior renal transplant, OSA on CPAP and secondary hyperparathyroidism, MRI 10/25 showed R SDH as previous up 10 18m and some new smaller collections along L cerebral covexity, along the R tentorial leaflet and the R inferior temproal lobe measuring up to 552m   OT comments  Pt progressing towards established OT goals. Pt continues to present with decreased safety, awareness, problem solving, balance, and activity tolerance. Pt requiring min-mod cues for attention to task and to avoid being distracted with functional mobility in hall. Pt additionally requiring mod cues to follow up to two step commands. Actively using BUE for washing face and neck at sink. Pt continues with decreased attention to L side. Continue to highly recommend AIR to optimize safety and independence in ADL and IADL.   Recommendations for follow up therapy are one component of a multi-disciplinary discharge planning process, led by the attending physician.  Recommendations may be updated based on patient status, additional functional criteria and insurance authorization.    Follow Up Recommendations  Acute inpatient rehab (3hours/day)    Assistance Recommended at Discharge Frequent or constant Supervision/Assistance  Patient can return home with the following  A little help with walking and/or transfers;A little help with bathing/dressing/bathroom;Assistance with cooking/housework;Direct  supervision/assist for medications management;Direct supervision/assist for financial management;Assist for transportation;Help with stairs or ramp for entrance   Equipment Recommendations  BSC/3in1    Recommendations for Other Services      Precautions / Restrictions Precautions Precautions: Fall Precaution Comments: watch vitals Restrictions Weight Bearing Restrictions: No       Mobility Bed Mobility Overal bed mobility: Needs Assistance Bed Mobility: Sit to Supine, Supine to Sit     Supine to sit: Min guard Sit to supine: Min guard   General bed mobility comments: no assist required Min guard A for safety    Transfers Overall transfer level: Needs assistance Equipment used: Rolling walker (2 wheels) Transfers: Sit to/from Stand Sit to Stand: Min guard           General transfer comment: Min guard A for safety     Balance Overall balance assessment: Needs assistance Sitting-balance support: Feet supported Sitting balance-Leahy Scale: Fair     Standing balance support: Bilateral upper extremity supported Standing balance-Leahy Scale: Poor Standing balance comment: poor dynamic balance.                           ADL either performed or assessed with clinical judgement   ADL Overall ADL's : Needs assistance/impaired     Grooming: Wash/dry face;Min guard;Standing Grooming Details (indicate cue type and reason): Min guard A for safety. Observed to use both R and L UE to wash face and back of neck. Both UE moving functionally             Lower Body Dressing: Min guard;Sit to/from stand Lower Body Dressing Details (indicate cue type and reason): Min guard A for safety. Donning pants and shoes without physical assist this session. Toilet Transfer: Min guard;Ambulation;Rolling  walker (2 wheels) Toilet Transfer Details (indicate cue type and reason): simulated in room         Functional mobility during ADLs: Minimal assistance;Rolling walker  (2 wheels) General ADL Comments: Min A for functional mobility in hall due to decreased safety with RW and frequent L foot dragging, especially when distracted.    Extremity/Trunk Assessment              Vision   Vision Assessment?: Vision impaired- to be further tested in functional context Additional Comments: Fatigues easily, and requires increasecd time to locate items. Decreased attention to the L   Perception     Praxis      Cognition Arousal/Alertness: Awake/alert Behavior During Therapy: Flat affect Overall Cognitive Status: Impaired/Different from baseline Area of Impairment: Following commands, Safety/judgement, Problem solving, Attention                   Current Attention Level: Selective   Following Commands: Follows one step commands consistently, Follows one step commands with increased time Safety/Judgement: Decreased awareness of safety Awareness: Intellectual, Emergent Problem Solving: Slow processing General Comments: Pt following one step commands with increased time. Very conversational and pleasant this session/agreeable to mobility. Mod cues to follow all multistep commands. Pt with emergent awarentss, stating "my foot always gets caught when I am distracted not looking where I am walking". Pt with awareness of most physical deficits, but struggles to identify cognitive difficlties.        Exercises Exercises: Other exercises Other Exercises Other Exercises: STSx5 Other Exercises: wall push ups x10 Other Exercises: composite grasp LUE x10    Shoulder Instructions       General Comments BP elevating to 193/80 after functional mobility    Pertinent Vitals/ Pain       Pain Assessment Pain Assessment: Faces Faces Pain Scale: Hurts a little bit Pain Location: R shoulder Pain Descriptors / Indicators: Aching Pain Intervention(s): Monitored during session, Limited activity within patient's tolerance  Home Living                                           Prior Functioning/Environment              Frequency  Min 2X/week        Progress Toward Goals  OT Goals(current goals can now be found in the care plan section)  Progress towards OT goals: Progressing toward goals  Acute Rehab OT Goals Patient Stated Goal: none stated OT Goal Formulation: With patient Time For Goal Achievement: 11/26/21 Potential to Achieve Goals: Good ADL Goals Pt Will Perform Grooming: Independently;standing Pt Will Transfer to Toilet: with modified independence;ambulating;bedside commode Pt Will Perform Toileting - Clothing Manipulation and hygiene: with modified independence;sit to/from stand Additional ADL Goal #1: Pt will be safe throughout the session with RW use.  Plan Discharge plan remains appropriate    Co-evaluation                 AM-PAC OT "6 Clicks" Daily Activity     Outcome Measure   Help from another person eating meals?: A Little Help from another person taking care of personal grooming?: A Little Help from another person toileting, which includes using toliet, bedpan, or urinal?: A Little Help from another person bathing (including washing, rinsing, drying)?: A Little Help from another person to put on and taking off regular upper body  clothing?: A Little Help from another person to put on and taking off regular lower body clothing?: A Little 6 Click Score: 18    End of Session Equipment Utilized During Treatment: Gait belt;Rolling walker (2 wheels)  OT Visit Diagnosis: Unsteadiness on feet (R26.81);Other abnormalities of gait and mobility (R26.89);Muscle weakness (generalized) (M62.81);Pain;Other symptoms and signs involving cognitive function Pain - part of body:  (R shoulder)   Activity Tolerance Patient tolerated treatment well   Patient Left in bed;with call bell/phone within reach;with bed alarm set   Nurse Communication Mobility status;Other (comment) (elevated BP)         Time: 8768-1157 OT Time Calculation (min): 25 min  Charges: OT General Charges $OT Visit: 1 Visit OT Treatments $Self Care/Home Management : 8-22 mins $Therapeutic Activity: 8-22 mins  Shanda Howells, OTR/L [facility] Acute Rehabilitation Office: [phone]   Lula Olszewski 11/18/2021, 3:56 PM

## 2021-11-18 NOTE — Progress Notes (Signed)
Nephrology progress note:  Subjective:  last HD on 10/27 with 3 kg UF.  He was given clonidine.  Had afib with RVR after the preceding HD session so UF not increased yesterday.  Spoke with his wife via speakerphone.     Review of systems:   Still with headache reports Nausea and vomiting but was able tolerate breakfast this am Denies overt shortness of breath  Objective Vital signs in last 24 hours: Vitals:   11/17/21 2145 11/18/21 0000 11/18/21 0400 11/18/21 0726  BP:  (!) 172/65 (!) 158/68 (!) 195/73  Pulse: 70 68 66 70  Resp: 15   19  Temp:  98.4 F (36.9 C) 98.3 F (36.8 C) 98 F (36.7 C)  TempSrc:  Oral Oral Oral  SpO2: 98% 97% 98% 98%  Weight:      Height:       Weight change:   Intake/Output Summary (Last 24 hours) at 11/18/2021 0750 Last data filed at 11/17/2021 1711 Gross per 24 hour  Intake 40 ml  Output 3 ml  Net 37 ml      Dialysis Orders:  [facility] MWF  4 hrs 15 min, BFR 450/AF 1.5  EDW 89.9, 2 K/ 2 Ca, UFP 4   Access: LU AVF  Hep 2500   STOP Heparin (has spontaneous subdural hematoma) Sensipar 60 mg qHD Calcitriol 1.25 mcg PO qHD   Assessment/Plan: New onset seizure - Possibly 2/2 Right SDH per neurology. Neurology following. Now back on keppra and off depakote. Seizure/fall precautions.   Subdural hematoma - Repeat head CT with concern for interval bleed. Neurosurgery assessed on 10/26 and again recommended conservative management with outpatient follow-up with them in clinic ESRD -  On dialysis MWF.  No Heparin with HD. Hypertension/volume  - per primary team; setting of intracranial bleed.  optimize volume with HD.  In the past has missed meds due to HD and seizure/ams and has not always received PRN's.   Will again move amlodipine to evening dosing to avoid holding it for HD - was stopped due to getting dilt for rapid afib but now back on.  He missed amlodipine altogether on 10/27 We have started losartan and increased to 50 mg nightly.  (Night time  dosing so that it isn't held for HD as this is a recurrent issue).   Continue hydralazine 25 mg TID (new med)  Anemia of CKD - previously without indication for ESA now with subdural hematoma and uncontrolled HTN.  For now follow trends. CBC in am  Secondary Hyperparathyroidism - on calcitriol. Sevelamer with meals Fever - abx per primary team. Improved   Hypokalemia - resolved and now on 2K bath here   OSA on CPAP - CPAP at night Chronic thrombocytopenia  - stable Afib with RVR - rapid afib s/p dilt on 10/25. Now off.  Per primary team. On beta blocker  Disposition per primary team    Labs: Basic Metabolic Panel: Recent Labs  Lab 11/15/21 0302 11/16/21 0834 11/17/21 1517  NA 130* 131* 134*  K 4.8 4.1 3.2*  CL 91* 91* 92*  CO2 '24 27 30  '$ GLUCOSE 87 139* 123*  BUN 59* 41* 18  CREATININE 12.36* 9.13* 5.60*  CALCIUM 9.5 9.0 8.8*  PHOS 6.3* 6.1* 3.6   Liver Function Tests: Recent Labs  Lab 11/15/21 0302 11/16/21 0834 11/17/21 1517  AST 15  --   --   ALT 11  --   --   ALKPHOS 63  --   --  BILITOT 1.6*  --   --   PROT 6.5  --   --   ALBUMIN 3.2* 2.9* 2.9*   No results for input(s): "LIPASE", "AMYLASE" in the last 168 hours. No results for input(s): "AMMONIA" in the last 168 hours. CBC: Recent Labs  Lab 11/13/21 0435 11/14/21 0550 11/15/21 0302 11/16/21 0834  WBC 5.8 7.6 5.6 3.6*  NEUTROABS 4.3  --  4.5  --   HGB 8.9* 10.1* 9.5* 9.0*  HCT 26.6* 31.0* 29.1* 26.8*  MCV 92.0 92.5 93.9 93.1  PLT 89* 108* 82* 90*   Cardiac Enzymes: No results for input(s): "CKTOTAL", "CKMB", "CKMBINDEX", "TROPONINI" in the last 168 hours. CBG: Recent Labs  Lab 11/14/21 0836 11/18/21 0652  GLUCAP 100* 127*    Iron Studies: No results for input(s): "IRON", "TIBC", "TRANSFERRIN", "FERRITIN" in the last 72 hours. Studies/Results: ECHOCARDIOGRAM COMPLETE  Result Date: 11/16/2021    ECHOCARDIOGRAM REPORT   Patient Name:   BRIA PORTALES Date of Exam: 11/16/2021 Medical Rec #:   [id number]       Height:       67.0 in Accession #:    [id number]      Weight:       200.0 lb Date of Birth:  03/08/1960        BSA:          2.022 m Patient Age:    61 years        BP:           175/70 mmHg Patient Gender: M               HR:           61 bpm. Exam Location:  Inpatient Procedure: 2D Echo, Color Doppler and Cardiac Doppler Indications:    I48.91* Unspecified atrial fibrillation  History:        Patient has prior history of Echocardiogram examinations, most                 recent 09/08/2020. Risk Factors:Hypertension, Diabetes,                 Dyslipidemia and Sleep Apnea.  Sonographer:    Raquel Sarna Senior RDCS Referring Phys: Grady  1. Left ventricular ejection fraction, by estimation, is 50 to 55%. The left ventricle has low normal function. The left ventricle has no regional wall motion abnormalities. Left ventricular diastolic parameters are consistent with Grade I diastolic dysfunction (impaired relaxation).  2. Right ventricular systolic function is normal. The right ventricular size is normal. Tricuspid regurgitation signal is inadequate for assessing PA pressure.  3. Left atrial size was mildly dilated.  4. The mitral valve is normal in structure. Trivial mitral valve regurgitation. No evidence of mitral stenosis.  5. The aortic valve is normal in structure. Aortic valve regurgitation is not visualized. Mild aortic valve stenosis. Aortic valve area, by VTI measures 1.69 cm. Aortic valve mean gradient measures 8.0 mmHg. Aortic valve Vmax measures 1.92 m/s.  6. The inferior vena cava is normal in size with greater than 50% respiratory variability, suggesting right atrial pressure of 3 mmHg. FINDINGS  Left Ventricle: Left ventricular ejection fraction, by estimation, is 50 to 55%. The left ventricle has low normal function. The left ventricle has no regional wall motion abnormalities. The left ventricular internal cavity size was normal in size. There is no left ventricular  hypertrophy. Left ventricular diastolic parameters are consistent with Grade I diastolic dysfunction (impaired relaxation). Right Ventricle:  The right ventricular size is normal. No increase in right ventricular wall thickness. Right ventricular systolic function is normal. Tricuspid regurgitation signal is inadequate for assessing PA pressure. Left Atrium: Left atrial size was mildly dilated. Right Atrium: Right atrial size was normal in size. Pericardium: There is no evidence of pericardial effusion. Presence of epicardial fat layer. Mitral Valve: The mitral valve is normal in structure. Trivial mitral valve regurgitation. No evidence of mitral valve stenosis. Tricuspid Valve: The tricuspid valve is normal in structure. Tricuspid valve regurgitation is not demonstrated. No evidence of tricuspid stenosis. Aortic Valve: The aortic valve is normal in structure. Aortic valve regurgitation is not visualized. Mild aortic stenosis is present. Aortic valve mean gradient measures 8.0 mmHg. Aortic valve peak gradient measures 14.7 mmHg. Aortic valve area, by VTI measures 1.69 cm. Pulmonic Valve: The pulmonic valve was normal in structure. Pulmonic valve regurgitation is not visualized. No evidence of pulmonic stenosis. Aorta: The aortic root is normal in size and structure. Venous: The inferior vena cava is normal in size with greater than 50% respiratory variability, suggesting right atrial pressure of 3 mmHg. IAS/Shunts: No atrial level shunt detected by color flow Doppler.  LEFT VENTRICLE PLAX 2D LVIDd:         5.70 cm   Diastology LVIDs:         4.10 cm   LV e' medial:    6.42 cm/s LV PW:         1.20 cm   LV E/e' medial:  19.0 LV IVS:        0.90 cm   LV e' lateral:   7.07 cm/s LVOT diam:     2.20 cm   LV E/e' lateral: 17.3 LV SV:         76 LV SV Index:   38 LVOT Area:     3.80 cm  RIGHT VENTRICLE RV S prime:     13.40 cm/s TAPSE (M-mode): 2.4 cm LEFT ATRIUM             Index        RIGHT ATRIUM           Index LA  diam:        4.40 cm 2.18 cm/m   RA Area:     18.30 cm LA Vol (A2C):   72.9 ml 36.03 ml/m  RA Volume:   50.20 ml  24.83 ml/m LA Vol (A4C):   63.6 ml 31.45 ml/m LA Biplane Vol: 69.9 ml 34.57 ml/m  AORTIC VALVE AV Area (Vmax):    1.69 cm AV Area (Vmean):   1.89 cm AV Area (VTI):     1.69 cm AV Vmax:           192.00 cm/s AV Vmean:          133.000 cm/s AV VTI:            0.452 m AV Peak Grad:      14.7 mmHg AV Mean Grad:      8.0 mmHg LVOT Vmax:         85.40 cm/s LVOT Vmean:        66.000 cm/s LVOT VTI:          0.201 m LVOT/AV VTI ratio: 0.44  AORTA Ao Root diam: 3.10 cm Ao Asc diam:  3.50 cm MITRAL VALVE MV Area (PHT): 3.61 cm     SHUNTS MV Decel Time: 210 msec     Systemic VTI:  0.20 m MV E velocity:  122.00 cm/s  Systemic Diam: 2.20 cm MV A velocity: 95.40 cm/s MV E/A ratio:  1.28 Kardie Tobb DO Electronically signed by Berniece Salines DO Signature Date/Time: 11/16/2021/4:05:41 PM    Final    Medications: Infusions:  cefTRIAXone (ROCEPHIN)  IV 1 g (11/17/21 1604)   levETIRAcetam 250 mg (11/17/21 2032)   levETIRAcetam 500 mg (11/17/21 9480)   metronidazole 500 mg (11/18/21 0319)   vancomycin Stopped (11/17/21 1235)     Scheduled Medications:  amLODipine  10 mg Oral Daily   calcitRIOL  1 mcg Oral Q M,W,F-HD   Chlorhexidine Gluconate Cloth  6 each Topical Q0600   Chlorhexidine Gluconate Cloth  6 each Topical Q0600   cholecalciferol  4,000 Units Oral Daily   doxazosin  4 mg Oral Daily   guaiFENesin  1,200 mg Oral BID   hydrALAZINE  25 mg Oral Q8H   isosorbide mononitrate  30 mg Oral Daily   losartan  50 mg Oral QPM   methocarbamol  750 mg Oral QID   metoprolol tartrate  100 mg Oral BID   multivitamin  1 tablet Oral Daily   pantoprazole  40 mg Oral BID AC   pravastatin  40 mg Oral Daily   pregabalin  25 mg Oral Daily   senna-docusate  1 tablet Oral BID   sevelamer carbonate  3,200 mg Oral TID WC    have reviewed scheduled and prn medications.  Physical Exam:     General adult  male in bed in NAD HEENT normocephalic atraumatic sclera anicteric Neck supple trachea midline Lungs clear to auscultation bilaterally normal work of breathing at rest  Heart S1S2 no rub Abdomen soft nontender nondistended Extremities no edema  Psych normal mood and affect Neuro - awake on arrival (most awake I have seen him).alert and oriented to person, location and situation but not to year (4 guesses and can't believe it's 2023); conversant; follows commands Access LUE AVF bruit and thrill    Claudia Desanctis, MD 11/18/2021, 8:16 AM

## 2021-11-19 DIAGNOSIS — I10 Essential (primary) hypertension: Secondary | ICD-10-CM | POA: Diagnosis not present

## 2021-11-19 DIAGNOSIS — R509 Fever, unspecified: Secondary | ICD-10-CM | POA: Diagnosis not present

## 2021-11-19 DIAGNOSIS — R569 Unspecified convulsions: Secondary | ICD-10-CM | POA: Diagnosis not present

## 2021-11-19 DIAGNOSIS — S065XAA Traumatic subdural hemorrhage with loss of consciousness status unknown, initial encounter: Secondary | ICD-10-CM | POA: Diagnosis not present

## 2021-11-19 LAB — CBC
HCT: 24.5 % — ABNORMAL LOW (ref 39.0–52.0)
Hemoglobin: 8.3 g/dL — ABNORMAL LOW (ref 13.0–17.0)
MCH: 30.7 pg (ref 26.0–34.0)
MCHC: 33.9 g/dL (ref 30.0–36.0)
MCV: 90.7 fL (ref 80.0–100.0)
Platelets: 74 10*3/uL — ABNORMAL LOW (ref 150–400)
RBC: 2.7 MIL/uL — ABNORMAL LOW (ref 4.22–5.81)
RDW: 13.2 % (ref 11.5–15.5)
WBC: 3.2 10*3/uL — ABNORMAL LOW (ref 4.0–10.5)
nRBC: 0 % (ref 0.0–0.2)

## 2021-11-19 LAB — RENAL FUNCTION PANEL
Albumin: 3.1 g/dL — ABNORMAL LOW (ref 3.5–5.0)
Anion gap: 14 (ref 5–15)
BUN: 32 mg/dL — ABNORMAL HIGH (ref 6–20)
CO2: 25 mmol/L (ref 22–32)
Calcium: 9.1 mg/dL (ref 8.9–10.3)
Chloride: 91 mmol/L — ABNORMAL LOW (ref 98–111)
Creatinine, Ser: 10.51 mg/dL — ABNORMAL HIGH (ref 0.61–1.24)
GFR, Estimated: 5 mL/min — ABNORMAL LOW
Glucose, Bld: 102 mg/dL — ABNORMAL HIGH (ref 70–99)
Phosphorus: 5.7 mg/dL — ABNORMAL HIGH (ref 2.5–4.6)
Potassium: 3.9 mmol/L (ref 3.5–5.1)
Sodium: 130 mmol/L — ABNORMAL LOW (ref 135–145)

## 2021-11-19 LAB — GLUCOSE, CAPILLARY: Glucose-Capillary: 163 mg/dL — ABNORMAL HIGH (ref 70–99)

## 2021-11-19 MED ORDER — CHLORHEXIDINE GLUCONATE CLOTH 2 % EX PADS
6.0000 | MEDICATED_PAD | Freq: Every day | CUTANEOUS | Status: DC
  Administered 2021-11-21: 6 via TOPICAL

## 2021-11-19 MED ORDER — LOSARTAN POTASSIUM 50 MG PO TABS
100.0000 mg | ORAL_TABLET | Freq: Every evening | ORAL | Status: DC
  Administered 2021-11-19 – 2021-11-20 (×2): 100 mg via ORAL
  Filled 2021-11-19 (×2): qty 2

## 2021-11-19 NOTE — Progress Notes (Signed)
Nephrology progress note:  Subjective:  last HD on 10/27 with 3 kg UF.  Unfortunately historically PRN's not being given for BP though did get some in the past 24 hours.  He feels better today.  Got some sleep last night  Review of systems:    Still with headache Some nausea Denies shortness of breath - this is better  Objective Vital signs in last 24 hours: Vitals:   11/18/21 2000 11/18/21 2113 11/19/21 0000 11/19/21 0400  BP: (!) 195/69  (!) 174/76 (!) 189/86  Pulse: 63 69 67 64  Resp:  19  19  Temp: 97.9 F (36.6 C)  98 F (36.7 C) 97.9 F (36.6 C)  TempSrc: Oral  Oral Oral  SpO2: 100% 98% 97% 99%  Weight:      Height:       Weight change:  No intake or output data in the 24 hours ending 11/19/21 0719    Dialysis Orders:  [facility] M[facility]  4 hrs 15 min, BFR 450/AF 1.5  EDW 89.9, 2 K/ 2 Ca, UFP 4   Access: LU AVF  Hep 2500   STOP Heparin (has spontaneous subdural hematoma) Sensipar 60 mg qHD Calcitriol 1.25 mcg PO qHD   Assessment/Plan: New onset seizure - Possibly 2/2 Right SDH per neurology. Neurology following. Now back on keppra and off depakote. Seizure/fall precautions.   Subdural hematoma - Repeat head CT with concern for interval bleed. Neurosurgery assessed on 10/26 and again recommended conservative management with outpatient follow-up with them in clinic ESRD -  On dialysis MWF.  Increase UF with next treatment.  No Heparin with HD. Hypertension/volume  - per primary team; setting of intracranial bleed.  optimize volume with HD.  In the past has missed meds due to HD and seizure/ams and has not always received PRN's.   Moved amlodipine to evening dosing to avoid holding it for HD - was stopped due to getting dilt for rapid afib but now back on. Increase losartan to 100 mg nightly (Night time dosing so that it isn't held for HD as this is a recurrent issue).   Continue hydralazine 25 mg TID (new med) PRN's are available as well  Increase UF with HD  Anemia of CKD -  previously without indication for ESA now with subdural hematoma and uncontrolled HTN.  Once BP control improved will need to resume ESA  Secondary Hyperparathyroidism - on calcitriol. Sevelamer with meals Fever - abx per primary team. Fever resolved   OSA on CPAP - CPAP at night Chronic thrombocytopenia  - stable Afib with RVR - rapid afib s/p dilt on 10/25. Now off.  Per primary team. On beta blocker  Disposition per primary team    Labs: Basic Metabolic Panel: Recent Labs  Lab 11/16/21 0834 11/17/21 1517 11/19/21 0326  NA 131* 134* 130*  K 4.1 3.2* 3.9  CL 91* 92* 91*  CO2 '27 30 25  '$ GLUCOSE 139* 123* 102*  BUN 41* 18 32*  CREATININE 9.13* 5.60* 10.51*  CALCIUM 9.0 8.8* 9.1  PHOS 6.1* 3.6 5.7*   Liver Function Tests: Recent Labs  Lab 11/15/21 0302 11/16/21 0834 11/17/21 1517 11/19/21 0326  AST 15  --   --   --   ALT 11  --   --   --   ALKPHOS 63  --   --   --   BILITOT 1.6*  --   --   --   PROT 6.5  --   --   --  ALBUMIN 3.2* 2.9* 2.9* 3.1*   No results for input(s): "LIPASE", "AMYLASE" in the last 168 hours. No results for input(s): "AMMONIA" in the last 168 hours. CBC: Recent Labs  Lab 11/13/21 0435 11/14/21 0550 11/15/21 0302 11/16/21 0834 11/19/21 0326  WBC 5.8 7.6 5.6 3.6* 3.2*  NEUTROABS 4.3  --  4.5  --   --   HGB 8.9* 10.1* 9.5* 9.0* 8.3*  HCT 26.6* 31.0* 29.1* 26.8* 24.5*  MCV 92.0 92.5 93.9 93.1 90.7  PLT 89* 108* 82* 90* 74*   Cardiac Enzymes: No results for input(s): "CKTOTAL", "CKMB", "CKMBINDEX", "TROPONINI" in the last 168 hours. CBG: Recent Labs  Lab 11/14/21 0836 11/18/21 0652  GLUCAP 100* 127*    Iron Studies: No results for input(s): "IRON", "TIBC", "TRANSFERRIN", "FERRITIN" in the last 72 hours. Studies/Results: No results found. Medications: Infusions:  cefTRIAXone (ROCEPHIN)  IV 1 g (11/18/21 1803)   levETIRAcetam 250 mg (11/17/21 2032)   levETIRAcetam 500 mg (11/18/21 8185)     Scheduled Medications:  amLODipine   10 mg Oral QPM   calcitRIOL  1 mcg Oral Q M,W,F-HD   Chlorhexidine Gluconate Cloth  6 each Topical Q0600   Chlorhexidine Gluconate Cloth  6 each Topical Q0600   cholecalciferol  4,000 Units Oral Daily   doxazosin  4 mg Oral Daily   guaiFENesin  1,200 mg Oral BID   hydrALAZINE  50 mg Oral Q8H   isosorbide mononitrate  30 mg Oral Daily   losartan  50 mg Oral QPM   methocarbamol  750 mg Oral QID   metoprolol tartrate  100 mg Oral BID   multivitamin  1 tablet Oral Daily   pantoprazole  40 mg Oral BID AC   pravastatin  40 mg Oral Daily   pregabalin  25 mg Oral Daily   senna-docusate  1 tablet Oral BID   sevelamer carbonate  3,200 mg Oral TID WC    have reviewed scheduled and prn medications.  Physical Exam:      General adult male in bed in NAD HEENT normocephalic atraumatic sclera anicteric Neck supple trachea midline Lungs clear to auscultation bilaterally normal work of breathing at rest  Heart S1S2 no rub Abdomen soft nontender nondistended Extremities no edema  Psych normal mood and affect Neuro - awake on arrival. Interactive and conversant; follows commands Access LUE AVF bruit and thrill    Claudia Desanctis, MD 11/19/2021, 7:34 AM

## 2021-11-19 NOTE — Progress Notes (Signed)
[facility] PROGRESS NOTE   Kirk [name] HYI:[id number] DOB: [birth year] DOA: 11/07/2021  PCP: Sandrea Hughs, NP  Brief History/Interval Summary: 61 year old male with PMH significant of end-stage renal disease on hemodialysis, Alport syndrome, hypertension, chronic thrombocytopenia, anemia of chronic disease, chronic headaches, hyperlipidemia, OSA on CPAP, pancytopenia, diabetes mellitus type 2, memory impairment, chronic diastolic CHF, chronic bilateral upper extremity and lower extremity weakness since C-spine surgery in February 2023 presented with new onset seizure at home lasting for 2 minutes. On presentation, CT of the head showed subdural hematoma.  Neurology and Neurosurgery were consulted.  He was started on Keppra.  Keppra was subsequently switched to Depakote on 11/11/2021 because of persistent headaches. Later Depakote was discontinued and placed back on Keppra.  10/24: Patient has developed right-sided gaze and left neglect.  Code stroke called.  CT head: No stroke, not a candidate for tPA, LT EEG connected.  Consultants: Neurology.  Neurosurgery.  Nephrology  Procedures:  EEG with long-term monitoring Hemodialysis    Subjective/Interval History: Patient mentions that he feels better this morning.  Headache is better.  Nausea is better.  Tolerating his diet.  Denies any abdominal pain.  Has been having regular bowel movements.     Assessment/Plan:  Fever and tachycardia/bacteremia Noted to have a fever of 100.9 F on 10/24.   Concern for SIRS.  No clear source identified. Work-up was initiated.  Patient placed on vancomycin cefepime and Flagyl.  Due to ongoing concern for seizure activity cefepime was discontinued and changed over to ceftriaxone.   Lactic acid level normal.  Procalcitonin 0.58.  WBC is normal. Chest x-ray noted to be abnormal but more suggestive of interstitial edema rather than infectious infiltrates.  No cough mentioned by patient. Has not  had further episodes of fever. 2 out of 4 blood cultures from 10/25 positive for Staph epidermidis.  Likely a contaminant.  Blood cultures were repeated on 10/26.  Negative so far. Vancomycin and metronidazole discontinued.  Plan a 5-day course of ceftriaxone.  Nausea and vomiting Could be due to elevated blood pressure and headaches.  Symptoms appear to be improving.  Still has some nausea but has not vomited in more than 48 hours.  Has been tolerating his diet.  Abdomen remains benign.  Has been having bowel movements.  Continue to monitor.  Paroxysmal atrial fibrillation with RVR  Patient returned from dialysis on 11/15/21 and was noted to be tachycardic.  EKG showed atrial fibrillation with RVR.  Patient was given a dose of Cardizem.  He had not received his metoprolol over 36 hours due to alteration in mental status and inability to take orally.   Metoprolol was resumed. Patient converted back to sinus rhythm within an hour.  Remains in sinus rhythm. Atrial fibrillation likely provoked due to inability to take medications as well as volume removal with dialysis. Due to subdural hematoma he is currently not a candidate for anticoagulation.  Will benefit from outpatient follow-up with primary care provider and perhaps referral to cardiology at some point in time.   TSH and free T4 noted to be normal.  Echocardiogram shows normal systolic function.  New onset seizures: Possibly due to right subdural hematoma as per neurology. Patient was started on Keppra,  renally dosed. Continue seizure/fall precautions. Keppra was switched to Depakote on 10/21 due to persistent headache after discussion with neurology. Later Neurology stated higher risk of bleeding and increased mortality with Depakote. Patient was placed back on Keppra 10/23. Depakote discontinued. Now patient is  only on Keppra.  Management per neurology. Neurological status is stable for the most part.   Subdural hematoma: Repeat CT  head still showed some subdural hematoma. Neurosurgery recommended conservative management. Repeat CT head on 10/21 showed trace increase in subdural hemorrhage along the right tentorium. Patient was reevaluated by neurosurgery Dr. Kathyrn Sheriff.  Neurology started Lyrica for headaches. He reports headache has improved though it persists. MRI from 10/26 suggests rebleeding.  She was evaluated by Dr. Reatha Armour with neurosurgery.  No indication for any surgical intervention at this time.  PT and OT evaluation.  Inpatient rehabilitation is recommended.    Accelerated hypertension It has been very challenging to treat his hypertension.   He is on multiple antihypertensives including amlodipine 10 mg every evening, doxazosin 4 mg daily, isosorbide mononitrate 30 mg daily, losartan 100 mg every evening, metoprolol 100 mg twice a day, hydralazine 50 mg 3 times a day.  Dose adjustments have been made over the last 2 days.  Blood pressure seems to be improving.  Continue to monitor for now.   Left Neglect / Right gaze: 10/24: Patient was noted to have right gaze and left neglect.  Code stroke was called. CT head : No new hemorrhage, CTA is negative for LVO. Likely secondary to seizure activity. Resolved.   End-stage renal disease on hemodialysis/hyponatremia Nephrology following, continue dialysis as per schedule.  Currently on a Monday Wednesday Friday schedule.   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   OSA on CPAP: Continue CPAP at night.   Anemia of chronic disease: Fluctuations in hemoglobin noted.  No evidence for overt bleeding other than the subdural hematoma.  Continue to monitor periodically.     Chronic thrombocytopenia: Platelets are stable, monitor intermittently.   GERD: Continue pantoprazole.   Hyperlipidemia Continue statins   Diabetes mellitus type 2: Stable for the most part.   Obesity: Diet and exercise discussed in  detail. Estimated body mass index is 33.18 kg/m as calculated from the following:   Height as of this encounter: '5\' 7"'$  (1.702 m).   Weight as of this encounter: 96.1 kg.    Chronic diastolic CHF: Volume is managed by dialysis. Daily weight, intake output charting, fluid restriction   Physical deconditioning: Inpatient rehabilitation not recommended.  DVT Prophylaxis: SCDs Code Status: Full code Family Communication: Discussed with patient.  Discussed with his wife over the phone Disposition Plan: Seen by PT and OT.  Inpatient rehabilitation is recommended.  Status is: Inpatient Remains inpatient appropriate because: Seizure activity      Medications: Scheduled:  amLODipine  10 mg Oral QPM   calcitRIOL  1 mcg Oral Q M,W,F-HD   Chlorhexidine Gluconate Cloth  6 each Topical Q0600   Chlorhexidine Gluconate Cloth  6 each Topical Q0600   cholecalciferol  4,000 Units Oral Daily   doxazosin  4 mg Oral Daily   guaiFENesin  1,200 mg Oral BID   hydrALAZINE  50 mg Oral Q8H   isosorbide mononitrate  30 mg Oral Daily   losartan  100 mg Oral QPM   methocarbamol  750 mg Oral QID   metoprolol tartrate  100 mg Oral BID   multivitamin  1 tablet Oral Daily   pantoprazole  40 mg Oral BID AC   pravastatin  40 mg Oral Daily   pregabalin  25 mg Oral Daily   senna-docusate  1 tablet Oral BID   sevelamer carbonate  3,200 mg Oral TID WC   Continuous:  cefTRIAXone (ROCEPHIN)  IV  1 g (11/18/21 1803)   levETIRAcetam 250 mg (11/17/21 2032)   levETIRAcetam 500 mg (11/19/21 0900)   NUU:[id number] **OR** acetaminophen, bisacodyl, cloNIDine, HYDROmorphone (DILAUDID) injection, labetalol, metoCLOPramide (REGLAN) injection, nitroGLYCERIN, ondansetron **OR** ondansetron (ZOFRAN) IV, mouth rinse, oxyCODONE, polyethylene glycol, sevelamer carbonate, traZODone  Antibiotics: Anti-infectives (From admission, onward)    Start     Dose/Rate Route Frequency Ordered Stop   11/15/21 1800  ceFEPIme  (MAXIPIME) 1 g in sodium chloride 0.9 % 100 mL IVPB  Status:  Discontinued        1 g 200 mL/hr over 30 Minutes Intravenous Every 24 hours 11/15/21 0106 11/15/21 1132   11/15/21 1600  cefTRIAXone (ROCEPHIN) 1 g in sodium chloride 0.9 % 100 mL IVPB        1 g 200 mL/hr over 30 Minutes Intravenous Every 24 hours 11/15/21 1132 11/20/21 1559   11/15/21 1200  vancomycin (VANCOREADY) IVPB 750 mg/150 mL  Status:  Discontinued        750 mg 150 mL/hr over 60 Minutes Intravenous Every M-W-F (Hemodialysis) 11/15/21 0106 11/18/21 1046   11/15/21 0200  vancomycin (VANCOREADY) IVPB 1500 mg/300 mL        1,500 mg 150 mL/hr over 120 Minutes Intravenous  Once 11/15/21 0106 11/15/21 1510   11/15/21 0200  ceFEPIme (MAXIPIME) 2 g in sodium chloride 0.9 % 100 mL IVPB        2 g 200 mL/hr over 30 Minutes Intravenous  Once 11/15/21 0106 11/15/21 1509   11/15/21 0115  metroNIDAZOLE (FLAGYL) IVPB 500 mg  Status:  Discontinued        500 mg 100 mL/hr over 60 Minutes Intravenous Every 12 hours 11/15/21 0057 11/18/21 1046       Objective:  Vital Signs  Vitals:   11/18/21 2113 11/19/21 0000 11/19/21 0400 11/19/21 0845  BP:  (!) 174/76 (!) 189/86 (!) 162/67  Pulse: 69 67 64 62  Resp: '19  19 20  '$ Temp:  98 F (36.7 C) 97.9 F (36.6 C) 97.9 F (36.6 C)  TempSrc:  Oral Oral Oral  SpO2: 98% 97% 99%   Weight:      Height:       No intake or output data in the 24 hours ending 11/19/21 0932  Filed Weights   11/15/21 0823 11/15/21 1245 11/17/21 0923  Weight: 95.2 kg 90.7 kg 91.6 kg    General appearance: Awake alert.  In no distress Resp: Clear to auscultation bilaterally.  Normal effort Cardio: S1-S2 is normal regular.  No S3-S4.  No rubs murmurs or bruit GI: Abdomen is soft.  Nontender nondistended.  Bowel sounds are present normal.  No masses organomegaly No obvious focal neurological deficits.    Lab Results:  Data Reviewed: I have personally reviewed following labs and reports of the imaging  studies  CBC: Recent Labs  Lab 11/13/21 0435 11/14/21 0550 11/15/21 0302 11/16/21 0834 11/19/21 0326  WBC 5.8 7.6 5.6 3.6* 3.2*  NEUTROABS 4.3  --  4.5  --   --   HGB 8.9* 10.1* 9.5* 9.0* 8.3*  HCT 26.6* 31.0* 29.1* 26.8* 24.5*  MCV 92.0 92.5 93.9 93.1 90.7  PLT 89* 108* 82* 90* 74*     Basic Metabolic Panel: Recent Labs  Lab 11/13/21 0435 11/14/21 0550 11/15/21 0302 11/16/21 0834 11/17/21 1517 11/19/21 0326  NA 130* 130* 130* 131* 134* 130*  K 4.8 4.9 4.8 4.1 3.2* 3.9  CL 89* 90* 91* 91* 92* 91*  CO2 '24 26 24 27 '$ 30  25  GLUCOSE 98 114* 87 139* 123* 102*  BUN 64* 38* 59* 41* 18 32*  CREATININE 13.05* 9.05* 12.36* 9.13* 5.60* 10.51*  CALCIUM 9.0 9.6 9.5 9.0 8.8* 9.1  MG 2.6* 2.2 2.3  --   --   --   PHOS  --  5.6* 6.3* 6.1* 3.6 5.7*     GFR: Estimated Creatinine Clearance: 8.2 mL/min (A) (by C-G formula based on SCr of 10.51 mg/dL (H)).  Liver Function Tests: Recent Labs  Lab 11/15/21 0302 11/16/21 0834 11/17/21 1517 11/19/21 0326  AST 15  --   --   --   ALT 11  --   --   --   ALKPHOS 63  --   --   --   BILITOT 1.6*  --   --   --   PROT 6.5  --   --   --   ALBUMIN 3.2* 2.9* 2.9* 3.1*       Coagulation Profile: Recent Labs  Lab 11/15/21 0302  INR 1.2      CBG: Recent Labs  Lab 11/14/21 0836 11/18/21 0652  GLUCAP 100* 127*      Radiology Studies: No results found.     LOS: 11 days   Kirk [name] [employer] on [email or web address]  11/19/2021, 9:32 AM

## 2021-11-19 NOTE — Progress Notes (Signed)
Patient's wife was found administering a dose of home medication, Renvela 800 mg, 3 pills total.  Education was provided regarding the importance of not taking home mediations.   Both wife and patient verbalize understanding.  [facility] informed.

## 2021-11-19 NOTE — Progress Notes (Signed)
PHARMACY - PHYSICIAN COMMUNICATION CRITICAL VALUE ALERT - BLOOD CULTURE IDENTIFICATION (BCID)  [name] is an 61 y.o. male who presented to [facility] on 11/07/2021 with a chief complaint of fever Tm100.9 - now afebrile wbc 7.6>3.6  S/p treatment vancomycin and metronidazole x3 days and ceftriaxone x5 days  Same Cx as previos 1/4 with staph epidermis now called with GPR - no species  - probably contaminant  Monitor patient off ABX for s/s infection    Name of physician (or Provider) Contacted: none  Current antibiotics: none  - completed  Changes to prescribed antibiotics recommended:  No additional antibiotics needed   Results for orders placed or performed during the hospital encounter of 11/07/21  Blood Culture ID Panel (Reflexed) (Collected: 11/15/2021  3:07 AM)  Result Value Ref Range   Enterococcus faecalis NOT DETECTED NOT DETECTED   Enterococcus Faecium NOT DETECTED NOT DETECTED   Listeria monocytogenes NOT DETECTED NOT DETECTED   Staphylococcus species DETECTED (A) NOT DETECTED   Staphylococcus aureus (BCID) NOT DETECTED NOT DETECTED   Staphylococcus epidermidis DETECTED (A) NOT DETECTED   Staphylococcus lugdunensis NOT DETECTED NOT DETECTED   Streptococcus species NOT DETECTED NOT DETECTED   Streptococcus agalactiae NOT DETECTED NOT DETECTED   Streptococcus pneumoniae NOT DETECTED NOT DETECTED   Streptococcus pyogenes NOT DETECTED NOT DETECTED   A.calcoaceticus-baumannii NOT DETECTED NOT DETECTED   Bacteroides fragilis NOT DETECTED NOT DETECTED   Enterobacterales NOT DETECTED NOT DETECTED   Enterobacter cloacae complex NOT DETECTED NOT DETECTED   Escherichia coli NOT DETECTED NOT DETECTED   Klebsiella aerogenes NOT DETECTED NOT DETECTED   Klebsiella oxytoca NOT DETECTED NOT DETECTED   Klebsiella pneumoniae NOT DETECTED NOT DETECTED   Proteus species NOT DETECTED NOT DETECTED   Salmonella species NOT DETECTED NOT DETECTED   Serratia marcescens NOT DETECTED NOT  DETECTED   Haemophilus influenzae NOT DETECTED NOT DETECTED   Neisseria meningitidis NOT DETECTED NOT DETECTED   Pseudomonas aeruginosa NOT DETECTED NOT DETECTED   Stenotrophomonas maltophilia NOT DETECTED NOT DETECTED   Candida albicans NOT DETECTED NOT DETECTED   Candida auris NOT DETECTED NOT DETECTED   Candida glabrata NOT DETECTED NOT DETECTED   Candida krusei NOT DETECTED NOT DETECTED   Candida parapsilosis NOT DETECTED NOT DETECTED   Candida tropicalis NOT DETECTED NOT DETECTED   Cryptococcus neoformans/gattii NOT DETECTED NOT DETECTED   Methicillin resistance mecA/C DETECTED (A) NOT DETECTED    Bonnita Nasuti Pharm.D. CPP, BCPS Clinical Pharmacist [phone] 11/19/2021 6:56 PM

## 2021-11-19 NOTE — Progress Notes (Signed)
Patient placed on CPAP by RN.

## 2021-11-20 DIAGNOSIS — R569 Unspecified convulsions: Secondary | ICD-10-CM | POA: Diagnosis not present

## 2021-11-20 DIAGNOSIS — R509 Fever, unspecified: Secondary | ICD-10-CM | POA: Diagnosis not present

## 2021-11-20 DIAGNOSIS — I10 Essential (primary) hypertension: Secondary | ICD-10-CM | POA: Diagnosis not present

## 2021-11-20 DIAGNOSIS — S065XAA Traumatic subdural hemorrhage with loss of consciousness status unknown, initial encounter: Secondary | ICD-10-CM | POA: Diagnosis not present

## 2021-11-20 LAB — CULTURE, BLOOD (ROUTINE X 2)
Culture: NO GROWTH
Special Requests: ADEQUATE

## 2021-11-20 LAB — GLUCOSE, CAPILLARY: Glucose-Capillary: 93 mg/dL (ref 70–99)

## 2021-11-20 MED ORDER — PREGABALIN 50 MG PO CAPS
50.0000 mg | ORAL_CAPSULE | Freq: Every day | ORAL | Status: DC
  Administered 2021-11-21: 50 mg via ORAL
  Filled 2021-11-20: qty 1

## 2021-11-20 MED ORDER — PREGABALIN 25 MG PO CAPS
25.0000 mg | ORAL_CAPSULE | Freq: Once | ORAL | Status: AC
  Administered 2021-11-20: 25 mg via ORAL
  Filled 2021-11-20: qty 1

## 2021-11-20 MED ORDER — HYDRALAZINE HCL 50 MG PO TABS
100.0000 mg | ORAL_TABLET | Freq: Three times a day (TID) | ORAL | Status: DC
  Administered 2021-11-20 – 2021-11-21 (×3): 100 mg via ORAL
  Filled 2021-11-20 (×3): qty 2

## 2021-11-20 MED ORDER — TRAZODONE HCL 50 MG PO TABS
50.0000 mg | ORAL_TABLET | Freq: Every day | ORAL | Status: DC
  Administered 2021-11-20: 50 mg via ORAL
  Filled 2021-11-20: qty 1

## 2021-11-20 MED ORDER — ONDANSETRON HCL 4 MG/2ML IJ SOLN
4.0000 mg | INTRAMUSCULAR | Status: AC | PRN
  Administered 2021-11-20: 4 mg via INTRAVENOUS
  Filled 2021-11-20: qty 2

## 2021-11-20 MED ORDER — SUCRALFATE 1 GM/10ML PO SUSP
1.0000 g | Freq: Three times a day (TID) | ORAL | Status: DC
  Administered 2021-11-20 – 2021-11-21 (×4): 1 g via ORAL
  Filled 2021-11-20 (×7): qty 10

## 2021-11-20 MED ORDER — ISOSORBIDE MONONITRATE ER 60 MG PO TB24
60.0000 mg | ORAL_TABLET | Freq: Every day | ORAL | Status: DC
  Administered 2021-11-21: 60 mg via ORAL
  Filled 2021-11-20: qty 1

## 2021-11-20 NOTE — Progress Notes (Signed)
Physical Therapy Treatment Patient Details Name: DELORIS MITTAG MRN: [id number] DOB: 1960-09-21 Today's Date: 11/20/2021   History of Present Illness Pt is a 61 y.o. male who presented to the ED 10/17 with seizure-like activity. CT revealed R frontal SDH. Near-syncopal-type episode during therapy 10/21, orthostatics stable. PMH:  ESRD on HD, anemia of CKD, carpal tunnel syndrome, chronic headaches, HTN, gout, chronic bilateral upper and lower extremity weakness due to cervical spine pathology (c spine sx 06/2012 and 02/2021), prior renal transplant, OSA on CPAP and secondary hyperparathyroidism, MRI 10/25 showed R SDH as previous up 10 50m and some new smaller collections along L cerebral covexity, along the R tentorial leaflet and the R inferior temproal lobe measuring up to 515m    PT Comments    Pt received seated EOB, pleasant and motivated for OOB mobility with continued progress towards acute goals. Pt with increased tolerance for gait training this session, however pt needing up to max assist to prevent falling with noted BLE buckling and pt endorsing difficulty controlling Les. Pt needing increased cues throughout for safety awareness, pacing, and attention to task as pt easily distractible. Current plan remains appropriate to address deficits and maximize functional independence and decrease caregiver burden. Pt continues to benefit from skilled PT services to progress toward functional mobility goals.    Recommendations for follow up therapy are one component of a multi-disciplinary discharge planning process, led by the attending physician.  Recommendations may be updated based on patient status, additional functional criteria and insurance authorization.  Follow Up Recommendations  Acute inpatient rehab (3hours/day)     Assistance Recommended at Discharge Frequent or constant Supervision/Assistance  Patient can return home with the following A lot of help with walking and/or  transfers;A little help with bathing/dressing/bathroom;Help with stairs or ramp for entrance;Assist for transportation   Equipment Recommendations  Other (comment) (TBA)    Recommendations for Other Services       Precautions / Restrictions Precautions Precautions: Fall Precaution Comments: watch vitals Restrictions Weight Bearing Restrictions: No     Mobility  Bed Mobility Overal bed mobility: Needs Assistance             General bed mobility comments: pt seated EOB pre and post session    Transfers Overall transfer level: Needs assistance Equipment used: Rolling walker (2 wheels) Transfers: Sit to/from Stand Sit to Stand: Min guard           General transfer comment: Min guard A for safety, cues for hand placement as pt with tendency to pull on RW    Ambulation/Gait Ambulation/Gait assistance: Min assist, Mod assist, Max assist Gait Distance (Feet): 100 Feet Assistive device: Rolling walker (2 wheels) Gait Pattern/deviations: Step-through pattern, Decreased stride length, Staggering right, Staggering left Gait velocity: decreased     General Gait Details: initially walking with min a for safety, as progressed into hallway, pt off balance and weaker and reported difficulty controlling his muscles several standing rest breaks, up to max a to recover balance x2, pt with B knee buckling needing max a to correct   Stairs             Wheelchair Mobility    Modified Rankin (Stroke Patients Only)       Balance Overall balance assessment: Needs assistance Sitting-balance support: Feet supported Sitting balance-Leahy Scale: Fair Sitting balance - Comments: able to don shoes in figure four pattern without assist   Standing balance support: Bilateral upper extremity supported Standing balance-Leahy Scale: Poor Standing balance comment:  poor dynamic balance.                            Cognition Arousal/Alertness: Awake/alert Behavior  During Therapy: Flat affect Overall Cognitive Status: Impaired/Different from baseline Area of Impairment: Following commands, Safety/judgement, Problem solving, Attention                   Current Attention Level: Selective   Following Commands: Follows one step commands consistently, Follows one step commands with increased time Safety/Judgement: Decreased awareness of safety Awareness: Intellectual, Emergent Problem Solving: Slow processing General Comments: Pt following one step commands with increased time. conversational and pleasant this session/agreeable to mobility. Mod cues to follow all multistep commands with increased cues throughout for safety awareness        Exercises Other Exercises Other Exercises: static standig with haed turns, pt with max c/o dizziness    General Comments General comments (skin integrity, edema, etc.): Pt with intermittent max c/o dizziness, BP eleavted but stable      Pertinent Vitals/Pain Pain Assessment Pain Assessment: Faces Faces Pain Scale: Hurts little more Pain Location: headache Pain Descriptors / Indicators: Headache Pain Intervention(s): Monitored during session, Limited activity within patient's tolerance    Home Living                          Prior Function            PT Goals (current goals can now be found in the care plan section) Acute Rehab PT Goals Patient Stated Goal: to feel better and less HA prior to going home PT Goal Formulation: With patient Time For Goal Achievement: 11/24/21    Frequency    Min 4X/week      PT Plan      Co-evaluation              AM-PAC PT "6 Clicks" Mobility   Outcome Measure  Help needed turning from your back to your side while in a flat bed without using bedrails?: A Little Help needed moving from lying on your back to sitting on the side of a flat bed without using bedrails?: A Little Help needed moving to and from a bed to a chair (including a  wheelchair)?: A Little Help needed standing up from a chair using your arms (e.g., wheelchair or bedside chair)?: A Little Help needed to walk in hospital room?: A Lot Help needed climbing 3-5 steps with a railing? : Total 6 Click Score: 15    End of Session Equipment Utilized During Treatment: Gait belt Activity Tolerance: Patient limited by fatigue Patient left: with call bell/phone within reach;in bed;with bed alarm set (seated EOB) Nurse Communication: Mobility status PT Visit Diagnosis: Muscle weakness (generalized) (M62.81);Other abnormalities of gait and mobility (R26.89) Pain - part of body:  (head)     Time: 0350-0938 PT Time Calculation (min) (ACUTE ONLY): 19 min  Charges:  $Gait Training: 8-22 mins                     Raiana Pharris R. PTA Acute Rehabilitation Services Office: [place] 11/20/2021, 9:29 AM

## 2021-11-20 NOTE — Progress Notes (Signed)
Inpatient Rehab Coordinator Note:  I met with patient at bedside and wife on phone to discuss CIR recommendations and goals/expectations of CIR stay.  We reviewed 3 hrs/day of therapy, physician follow up, and average length of stay 2 weeks (dependent upon progress) with goals of min A. Wife states she will be home 24/7 to assist as needed. Will continue to follow for medical readiness and bed availability for potential admission to CIR in the next 1-2 days.   Rehab Admissons Coordinator [place], [place], [place] [phone]

## 2021-11-20 NOTE — Progress Notes (Signed)
Received patient in bed to unit.  Alert and oriented.  Informed consent signed and in chart.   Treatment initiated: 1209 Treatment completed: 1617  Patient tolerated well.  Transported back to the room  Alert, without acute distress.  Hand-off given to patient's nurse.   Access used: Fistula Access issues: none  Total UF removed: 4000 Medication(s) given: Zofran, Oxy Post HD VS: 98.3, 173/70(99), HR-66, RR-20, SP02-99 Post HD weight: 94.1kg   [facility]

## 2021-11-20 NOTE — Progress Notes (Signed)
[facility] PROGRESS NOTE   Kirk [name] PJK:[id number] DOB: 11-Dec-1960 DOA: 11/07/2021  PCP: Sandrea Hughs, NP  Brief History/Interval Summary: 61 year old male with PMH significant of end-stage renal disease on hemodialysis, Alport syndrome, hypertension, chronic thrombocytopenia, anemia of chronic disease, chronic headaches, hyperlipidemia, OSA on CPAP, pancytopenia, diabetes mellitus type 2, memory impairment, chronic diastolic CHF, chronic bilateral upper extremity and lower extremity weakness since C-spine surgery in February 2023 presented with new onset seizure at home lasting for 2 minutes. On presentation, CT of the head showed subdural hematoma.  Neurology and Neurosurgery were consulted.  He was started on Keppra.  Keppra was subsequently switched to Depakote on 11/11/2021 because of persistent headaches. Later Depakote was discontinued and placed back on Keppra.  10/24: Patient has developed right-sided gaze and left neglect.  Code stroke called.  CT head: No stroke, not a candidate for tPA, LT EEG connected.  Consultants: Neurology.  Neurosurgery.  Nephrology  Procedures:  EEG with long-term monitoring Hemodialysis    Subjective/Interval History: Overall patient does feel better compared to few days ago.  He continues to have headache and weakness in his hands which are chronic for the most part.  Nausea appears to be stable.  Tolerating his diet.       Assessment/Plan:  Fever and tachycardia/bacteremia Noted to have a fever of 100.9 F on 10/24.   Concern for SIRS.  No clear source identified. Work-up was initiated.  Patient placed on vancomycin cefepime and Flagyl.  Due to ongoing concern for seizure activity cefepime was discontinued and changed over to ceftriaxone.   Lactic acid level normal.  Procalcitonin 0.58.  WBC is normal. Chest x-ray noted to be abnormal but more suggestive of interstitial edema rather than infectious infiltrates.  No cough  mentioned by patient. Has not had further episodes of fever. 2 out of 4 blood cultures from 10/25 positive for Staph epidermidis.  Likely a contaminant.  Blood cultures were repeated on 10/26.  Negative so far. Vancomycin and metronidazole discontinued.  He was given 5-day course of ceftriaxone.  Nausea and vomiting Could be due to elevated blood pressure and headaches.   Symptoms have improved.  Continue PPI.  Initiate Carafate.  Abdomen is benign on examination.   Has been able to tolerate his diet.  Paroxysmal atrial fibrillation with RVR  Patient returned from dialysis on 11/15/21 and was noted to be tachycardic.  EKG showed atrial fibrillation with RVR.  Patient was given a dose of Cardizem.  He had not received his metoprolol over 36 hours due to alteration in mental status and inability to take orally.   Metoprolol was resumed. Patient converted back to sinus rhythm within an hour.  Remains in sinus rhythm. Atrial fibrillation likely provoked due to inability to take medications as well as volume removal with dialysis. Due to subdural hematoma he is currently not a candidate for anticoagulation.  Will benefit from outpatient follow-up with primary care provider and perhaps referral to cardiology at some point in time.   TSH and free T4 noted to be normal.  Echocardiogram shows normal systolic function.  New onset seizures: Possibly due to right subdural hematoma as per neurology. Patient was started on Keppra,  renally dosed. Continue seizure/fall precautions. Keppra was switched to Depakote on 10/21 due to persistent headache after discussion with neurology. Later Neurology stated higher risk of bleeding and increased mortality with Depakote. Patient was placed back on Keppra 10/23. Depakote discontinued. Now patient is only on Keppra.  Management  per neurology. Neurological status is stable for the most part.   Subdural hematoma: Repeat CT head still showed some subdural  hematoma. Neurosurgery recommended conservative management. Repeat CT head on 10/21 showed trace increase in subdural hemorrhage along the right tentorium. Patient was reevaluated by neurosurgery Dr. Kathyrn Sheriff.  Neurology started Lyrica for headaches. He reports headache has improved though it persists. MRI from 10/26 suggests rebleeding.  She was evaluated by Dr. Reatha Armour with neurosurgery.  No indication for any surgical intervention at this time.  PT and OT evaluation.  Inpatient rehabilitation is recommended.   If patient's symptoms including headache appears to be chronic in nature.  He will benefit from being seen by neurology in the outpatient setting.  We could increase the dose of his Lyrica to 50 mg daily.  Accelerated hypertension It has been very challenging to treat his hypertension.   He is on multiple antihypertensives including amlodipine 10 mg every evening, doxazosin 4 mg daily, isosorbide mononitrate 30 mg daily, losartan 100 mg every evening, metoprolol 100 mg twice a day, hydralazine 50 mg 3 times a day.  Blood pressure has improved but occasional high readings noted.  We will see how he does after dialysis today.   Left Neglect / Right gaze: 10/24: Patient was noted to have right gaze and left neglect.  Code stroke was called. CT head : No new hemorrhage, CTA is negative for LVO. Likely secondary to seizure activity. Resolved.   End-stage renal disease on hemodialysis/hyponatremia Nephrology following, continue dialysis as per schedule.  Currently on a Monday Wednesday Friday schedule.   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   OSA on CPAP: Continue CPAP at night.   Anemia of chronic disease: Fluctuations in hemoglobin noted.  No evidence for overt bleeding other than the subdural hematoma.  Continue to monitor periodically.     Chronic thrombocytopenia: Platelets are stable, monitor intermittently.   GERD: Continue  pantoprazole.   Hyperlipidemia Continue statins   Diabetes mellitus type 2: Stable for the most part.   Obesity: Diet and exercise discussed in detail. Estimated body mass index is 33.18 kg/m as calculated from the following:   Height as of this encounter: '5\' 7"'$  (1.702 m).   Weight as of this encounter: 96.1 kg.    Chronic diastolic CHF: Volume is managed by dialysis. Daily weight, intake output charting, fluid restriction   Physical deconditioning: Inpatient rehabilitation not recommended.  DVT Prophylaxis: SCDs Code Status: Full code Family Communication: Discussed with patient.  Discussed with his wife over the phone Disposition Plan: Seen by PT and OT.  Inpatient rehabilitation is recommended.  Anticipate discharge in 24 to 48 hours depending on patient's blood pressures and his nausea.  Status is: Inpatient Remains inpatient appropriate because: Seizure activity      Medications: Scheduled:  amLODipine  10 mg Oral QPM   calcitRIOL  1 mcg Oral Q M,W,F-HD   Chlorhexidine Gluconate Cloth  6 each Topical Q0600   cholecalciferol  4,000 Units Oral Daily   doxazosin  4 mg Oral Daily   guaiFENesin  1,200 mg Oral BID   hydrALAZINE  50 mg Oral Q8H   isosorbide mononitrate  30 mg Oral Daily   losartan  100 mg Oral QPM   methocarbamol  750 mg Oral QID   metoprolol tartrate  100 mg Oral BID   multivitamin  1 tablet Oral Daily   pantoprazole  40 mg Oral BID AC   pravastatin  40 mg Oral Daily  pregabalin  25 mg Oral Daily   senna-docusate  1 tablet Oral BID   sevelamer carbonate  3,200 mg Oral TID WC   sucralfate  1 g Oral TID WC & HS   traZODone  50 mg Oral QHS   Continuous:  levETIRAcetam 250 mg (11/17/21 2032)   levETIRAcetam 500 mg (11/20/21 0842)   GUY:[id number] **OR** acetaminophen, bisacodyl, cloNIDine, HYDROmorphone (DILAUDID) injection, labetalol, metoCLOPramide (REGLAN) injection, nitroGLYCERIN, ondansetron **OR** ondansetron (ZOFRAN) IV, mouth rinse,  oxyCODONE, polyethylene glycol, sevelamer carbonate  Antibiotics: Anti-infectives (From admission, onward)    Start     Dose/Rate Route Frequency Ordered Stop   11/15/21 1800  ceFEPIme (MAXIPIME) 1 g in sodium chloride 0.9 % 100 mL IVPB  Status:  Discontinued        1 g 200 mL/hr over 30 Minutes Intravenous Every 24 hours 11/15/21 0106 11/15/21 1132   11/15/21 1600  cefTRIAXone (ROCEPHIN) 1 g in sodium chloride 0.9 % 100 mL IVPB        1 g 200 mL/hr over 30 Minutes Intravenous Every 24 hours 11/15/21 1132 11/20/21 0750   11/15/21 1200  vancomycin (VANCOREADY) IVPB 750 mg/150 mL  Status:  Discontinued        750 mg 150 mL/hr over 60 Minutes Intravenous Every M-W-F (Hemodialysis) 11/15/21 0106 11/18/21 1046   11/15/21 0200  vancomycin (VANCOREADY) IVPB 1500 mg/300 mL        1,500 mg 150 mL/hr over 120 Minutes Intravenous  Once 11/15/21 0106 11/15/21 1510   11/15/21 0200  ceFEPIme (MAXIPIME) 2 g in sodium chloride 0.9 % 100 mL IVPB        2 g 200 mL/hr over 30 Minutes Intravenous  Once 11/15/21 0106 11/15/21 1509   11/15/21 0115  metroNIDAZOLE (FLAGYL) IVPB 500 mg  Status:  Discontinued        500 mg 100 mL/hr over 60 Minutes Intravenous Every 12 hours 11/15/21 0057 11/18/21 1046       Objective:  Vital Signs  Vitals:   11/20/21 0355 11/20/21 0544 11/20/21 0738 11/20/21 1016  BP:  (!) 178/67 (!) 171/78 (!) 192/75  Pulse:   80   Resp:   19   Temp: 98.7 F (37.1 C)  98.6 F (37 C)   TempSrc: Axillary  Axillary   SpO2:   99%   Weight:      Height:        Intake/Output Summary (Last 24 hours) at 11/20/2021 1044 Last data filed at 11/20/2021 1000 Gross per 24 hour  Intake 443.11 ml  Output --  Net 443.11 ml    Filed Weights   11/15/21 0823 11/15/21 1245 11/17/21 0923  Weight: 95.2 kg 90.7 kg 91.6 kg    General appearance: Awake alert.  In no distress Resp: Clear to auscultation bilaterally.  Normal effort Cardio: S1-S2 is normal regular.  No S3-S4.  No rubs  murmurs or bruit GI: Abdomen is soft.  Nontender nondistended.  Bowel sounds are present normal.  No masses organomegaly Extremities: No edema.     Lab Results:  Data Reviewed: I have personally reviewed following labs and reports of the imaging studies  CBC: Recent Labs  Lab 11/14/21 0550 11/15/21 0302 11/16/21 0834 11/19/21 0326  WBC 7.6 5.6 3.6* 3.2*  NEUTROABS  --  4.5  --   --   HGB 10.1* 9.5* 9.0* 8.3*  HCT 31.0* 29.1* 26.8* 24.5*  MCV 92.5 93.9 93.1 90.7  PLT 108* 82* 90* 74*     Basic Metabolic  Panel: Recent Labs  Lab 11/14/21 0550 11/15/21 0302 11/16/21 0834 11/17/21 1517 11/19/21 0326  NA 130* 130* 131* 134* 130*  K 4.9 4.8 4.1 3.2* 3.9  CL 90* 91* 91* 92* 91*  CO2 '26 24 27 30 25  '$ GLUCOSE 114* 87 139* 123* 102*  BUN 38* 59* 41* 18 32*  CREATININE 9.05* 12.36* 9.13* 5.60* 10.51*  CALCIUM 9.6 9.5 9.0 8.8* 9.1  MG 2.2 2.3  --   --   --   PHOS 5.6* 6.3* 6.1* 3.6 5.7*     GFR: Estimated Creatinine Clearance: 8.2 mL/min (A) (by C-G formula based on SCr of 10.51 mg/dL (H)).  Liver Function Tests: Recent Labs  Lab 11/15/21 0302 11/16/21 0834 11/17/21 1517 11/19/21 0326  AST 15  --   --   --   ALT 11  --   --   --   ALKPHOS 63  --   --   --   BILITOT 1.6*  --   --   --   PROT 6.5  --   --   --   ALBUMIN 3.2* 2.9* 2.9* 3.1*       Coagulation Profile: Recent Labs  Lab 11/15/21 0302  INR 1.2      CBG: Recent Labs  Lab 11/14/21 0836 11/18/21 0652 11/19/21 2208 11/20/21 0454  GLUCAP 100* 127* 163* 50      Radiology Studies: No results found.     LOS: 12 days   Kirk [name] [employer] on [email or web address]  11/20/2021, 10:44 AM

## 2021-11-20 NOTE — Progress Notes (Addendum)
Nephrology Progress Note  Subjective:  seen in room, no c/o's today. Will need rehab he says.    Objective Vital signs in last 24 hours: Vitals:   11/20/21 1157 11/20/21 1209 11/20/21 1230 11/20/21 1300  BP:  (!) 199/80  (!) 184/78  Pulse:  63 65 72  Resp:  19 17 (!) 26  Temp:      TempSrc:      SpO2:  100% 98% 99%  Weight: 98.1 kg     Height:       Weight change:   Intake/Output Summary (Last 24 hours) at 11/20/2021 1321 Last data filed at 11/20/2021 1000 Gross per 24 hour  Intake 443.11 ml  Output --  Net 443.11 ml   Physical Exam:      General adult male in bed in NAD HEENT normocephalic atraumatic sclera anicteric Neck supple trachea midline Lungs clear to auscultation bilaterally normal work of breathing at rest  Heart S1S2 no rub Abdomen soft nontender nondistended Extremities no edema  Psych normal mood and affect Neuro - awake on arrival. Interactive and conversant; follows commands Access LUE AVF bruit and thrill    OP HD:  [facility] MWF  4h 35mn 450/1.5   89.9kg  2/2 bath  P4 LUA AVF  Hep NONE HERE (was on hep 2500 prior to admit) - sensipar 60 mg qHD - calcitriol 1.25 mcg PO qHD   Assessment/Plan: New onset seizure - due to 2/2 Right SDH per neurology. Neurology following. Now back on keppra and off depakote. Seizure/fall precautions.   Subdural hematoma - Repeat head CT with concern for interval bleed. Neurosurgery assessed on 10/26 and again recommended conservative management with outpatient follow-up with them in clinic ESRD -  On dialysis MWF.  Increase UF with next treatment.  No Heparin with HD. Hypertension/volume  - per primary team; setting of intracranial bleed.  optimize volume with HD.  In the past has missed meds due to HD and seizure/ams and has not always received PRN's.  Moved amlodipine and losartan to evening dosing to avoid holding it for HD. Continue hydralazine 25 mg TID (new med). PRN's ordered. Large UF next HD.   Anemia of CKD -  previously without indication for ESA now with subdural hematoma and uncontrolled HTN. Resume ESA once BP under control  Secondary Hyperparathyroidism - on calcitriol. Sevelamer with meals Fever - abx per primary team. Fever resolved   OSA on CPAP - CPAP at night Chronic thrombocytopenia  - stable Afib with RVR - rapid afib s/p dilt on 10/25. Now off.  Per primary team. On beta blocker   RKelly Splinter MD 11/20/2021, 1:28 PM  Recent Labs  Lab 11/16/21 0834 11/17/21 1517 11/19/21 0326  HGB 9.0*  --  8.3*  ALBUMIN 2.9* 2.9* 3.1*  CALCIUM 9.0 8.8* 9.1  PHOS 6.1* 3.6 5.7*  CREATININE 9.13* 5.60* 10.51*  K 4.1 3.2* 3.9    Inpatient medications:  amLODipine  10 mg Oral QPM   calcitRIOL  1 mcg Oral Q M,W,F-HD   Chlorhexidine Gluconate Cloth  6 each Topical Q0600   cholecalciferol  4,000 Units Oral Daily   doxazosin  4 mg Oral Daily   guaiFENesin  1,200 mg Oral BID   hydrALAZINE  50 mg Oral Q8H   isosorbide mononitrate  30 mg Oral Daily   losartan  100 mg Oral QPM   methocarbamol  750 mg Oral QID   metoprolol tartrate  100 mg Oral BID   multivitamin  1 tablet Oral  Daily   pantoprazole  40 mg Oral BID AC   pravastatin  40 mg Oral Daily   pregabalin  25 mg Oral Once   [START ON 11/21/2021] pregabalin  50 mg Oral Daily   senna-docusate  1 tablet Oral BID   sevelamer carbonate  3,200 mg Oral TID WC   sucralfate  1 g Oral TID WC & HS   traZODone  50 mg Oral QHS    levETIRAcetam 250 mg (11/17/21 2032)   levETIRAcetam 500 mg (11/20/21 0842)   acetaminophen **OR** acetaminophen, bisacodyl, cloNIDine, HYDROmorphone (DILAUDID) injection, labetalol, metoCLOPramide (REGLAN) injection, nitroGLYCERIN, ondansetron **OR** ondansetron (ZOFRAN) IV, ondansetron (ZOFRAN) IV, mouth rinse, oxyCODONE, polyethylene glycol, sevelamer carbonate

## 2021-11-20 NOTE — Progress Notes (Signed)
Pt stated he would placed self on CPAP when he was ready. Asked pt to have RN call this RT if he needed assistance.

## 2021-11-20 NOTE — PMR Pre-admission (Signed)
PMR Admission Coordinator Pre-Admission Assessment  Patient: Kirk [name] is an 61 y.o., male MRN: [id number] DOB: 1961/01/21 Height: 5' 7" (170.2 cm) Weight: 91.6 kg  Insurance Information HMO:     PPO:      PCP:      IPA:      80/20:      OTHER:  PRIMARY: Medicare A & B      Policy#: [id number]      Subscriber: patient Benefits:  Phone #: Onesourse-online verified    Eff. Date: 04/23/2010     Deduct: $1600      Out of Pocket Max: $0       CIR: 100%      SNF: 20 full days Outpatient: 80% coverage, 20% copay      Home Health: 100%       DME: 80% coverage, 20% copay       SECONDARY: BCBS      Policy#: [id number]     Phone#: [phone]    The "Data Collection Information Summary" for patients in Inpatient Rehabilitation Facilities with attached "Privacy Act [place] Records" was provided and verbally reviewed with: Patient and Family  Emergency Contact Information Contact Information     Name Relation Home Work Mobile   Kirk, [name] Spouse [phone]  [phone]       Current Medical History  Patient Admitting Diagnosis: SDH, seizure History of Present Illness:  Kirk [name] is a 61 year old right-handed male with history of 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  Per chart review patient lives with spouse and family.  1 level home 3 steps to entry.  Reportedly independent with history of frequent falls and was driving short distances.  Wife can provide assistance as needed.  Presented 11/07/2021 after reported seizure found by his wife lasting approximately 2 minutes.  There has been reports  of headache for the past few days.  Admission chemistries unremarkable except glucose 149 BUN 40 creatinine 11.66, hemoglobin 10.1.  EEG no seizure or epileptiform discharge were seen.  Echocardiogram with ejection fraction of 50 to 55% no wall motion abnormalities grade 1 diastolic dysfunction.  Cranial CT scan showed acute subdural hematoma overlying the right frontal convexity measuring 9 mm in greatest radial diameter.  Mild adjacent mass effect and 1-2 mm of leftward midline shift at the septum Pellucidum.  Follow-up neurosurgery Dr. 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  Additional smaller subdural collections along the left cerebral convexity, along the right tentorial leaflet, and along the right inferior temporal lobe measuring up to 5 mm.  Hospital course follow-up nephrology services hemodialysis ongoing as indicated.  Maintained on Keppra for seizure prophylaxis.  Patient did spike a low-grade fever 100.9 10/24 concern for SIRS placed on vancomycin cefepime and Flagyl.  Lactic acid level was normal procalcitonin 0.58 WBC normal chest x-ray suggestive of interstitial edema rather than infectious infiltrate.  2 out of 4 blood cultures from 10/25 positive for staph epidermidis likely contaminant he did complete course of antibiotic therapy no further fever noted.  On the afternoon of 11/15/2021 return from dialysis noted to be tachycardic EKG atrial fibrillation with RVR and received a dose of Cardizem converting back to sinus rhythm.  It was felt atrial fibrillation likely provoked due to inability to take medications prior to dialysis.  He was not a candidate for anticoagulation due to hematoma.  Patient is tolerating a regular consistency diet.  Therapy evaluations  completed due to patient decreased functional mobility was admitted for a comprehensive rehab program. Complete NIHSS TOTAL: 5  [facility]'s [facility] record from [facility] has been reviewed by the rehabilitation admission coordinator and physician.  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)    Anxiety    Asthma    Asthma    moderate persistent   Carpal tunnel syndrome    Cellulitis and abscess of trunk    Cholelithiasis 07/13/2014   Chronic headaches    Debility, unspecified    Dermatophytosis of the body    Dysrhythmia    history of   Edema    End stage renal disease on dialysis [facility])    "MWF; [facility] in [facility]" (10/21/2014)   Essential hypertension, benign    GERD (gastroesophageal reflux disease)    Gout, unspecified    HTN (hypertension)    Hypertrophy of prostate without urinary obstruction and other lower urinary tract symptoms (LUTS)    Hypotension, unspecified    Impotence of organic origin    Insomnia, unspecified    Kidney replaced by transplant    Localization-related (focal) (partial) epilepsy and epileptic syndromes with complex partial seizures, without mention of intractable epilepsy    12-15-19- Wife states he has NEVER had a seizure    Lumbago    Memory loss    OSA on CPAP    Other and unspecified hyperlipidemia    controlled /managed per wife    Other chronic nonalcoholic liver disease    Other malaise and fatigue    Other nonspecific abnormal serum enzyme levels    Pain in joint, lower leg    Pain in joint, upper arm    Pneumonia "several times"   Renal dialysis status(V45.11) 02/05/2010   restarted 01/02/13 ofter renal trransplant failure   Secondary hyperparathyroidism (of renal origin)    Shortness of breath    Sleep apnea    wears cpap    Tension headache     Unspecified constipation    Unspecified essential hypertension    Unspecified hereditary and idiopathic peripheral neuropathy    Unspecified vitamin D deficiency     Has the patient had major surgery during 100 days prior to admission? No  Family History   family history is not on file. He was adopted.  Current Medications  Current Facility-Administered Medications:    acetaminophen (TYLENOL) tablet 650 mg, 650 mg, Oral, Q6H PRN, 650 mg at 11/13/21 2050 **OR** acetaminophen (TYLENOL) suppository 650 mg, 650 mg, Rectal, Q6H PRN, Etta Quill, DO, 650 mg at 11/15/21 0206   amLODipine (NORVASC) tablet 10 mg, 10 mg, Oral, QPM, Harrie Jeans C, MD, 10 mg at 11/19/21 1600   bisacodyl (DULCOLAX) suppository 10 mg, 10 mg, Rectal, Daily PRN, Starla Link, Kshitiz, MD   calcitRIOL (ROCALTROL) capsule 1 mcg, 1 mcg, Oral, Q M,W,F-HD, Alcario Drought, Jared M, DO, 1 mcg at 11/08/21 2026   Chlorhexidine  Gluconate Cloth 2 % PADS 6 each, 6 each, Topical, Q0600, Claudia Desanctis, MD   cholecalciferol (VITAMIN D3) 25 MCG (1000 UNIT) tablet 4,000 Units, 4,000 Units, Oral, Daily, Etta Quill, DO, 4,000 Units at 11/20/21 0830   cloNIDine (CATAPRES) tablet 0.2 mg, 0.2 mg, Oral, Q4H PRN, Kristopher Oppenheim, DO, 0.2 mg at 11/19/21 0617   doxazosin (CARDURA) tablet 4 mg, 4 mg, Oral, Daily, Alcario Drought, Jared M, DO, 4 mg at 11/20/21 0831   guaiFENesin (MUCINEX) 12 hr tablet 1,200 mg, 1,200 mg, Oral, BID, Alcario Drought, Jared M, DO, 1,200 mg at 11/20/21 0831   hydrALAZINE (APRESOLINE) tablet 50 mg, 50 mg, Oral, Q8H, Bonnielee Haff, MD, 50 mg at 11/20/21 0544   HYDROmorphone (DILAUDID) injection 0.5 mg, 0.5 mg, Intravenous, Q4H PRN, Starla Link, Kshitiz, MD, 0.5 mg at 11/16/21 2029   isosorbide mononitrate (IMDUR) 24 hr tablet 30 mg, 30 mg, Oral, Daily, Alcario Drought, Jared M, DO, 30 mg at 11/20/21 8675   labetalol (NORMODYNE) injection 20 mg, 20 mg, Intravenous, Q2H PRN, Bonnielee Haff, MD, 20 mg at 11/19/21 1600   levETIRAcetam (KEPPRA) 250 mg in  sodium chloride 0.9 % 100 mL IVPB, 250 mg, Intravenous, Q M,W,F-2000, Derek Jack, MD, Last Rate: 410 mL/hr at 11/17/21 2032, 250 mg at 11/17/21 2032   [COMPLETED] levETIRAcetam (KEPPRA) IVPB 1500 mg/ 100 mL premix, 1,500 mg, Intravenous, Once, Stopped at 11/14/21 1552 **FOLLOWED BY** levETIRAcetam (KEPPRA) IVPB 500 mg/100 mL premix, 500 mg, Intravenous, Q24H, Derek Jack, MD, Last Rate: 400 mL/hr at 11/20/21 0842, 500 mg at 11/20/21 0842   losartan (COZAAR) tablet 100 mg, 100 mg, Oral, QPM, Claudia Desanctis, MD, 100 mg at 11/19/21 1607   methocarbamol (ROBAXIN) tablet 750 mg, 750 mg, Oral, QID, Alcario Drought, Jared M, DO, 750 mg at 11/20/21 0830   metoCLOPramide (REGLAN) injection 10 mg, 10 mg, Intravenous, Q6H PRN, Starla Link, Kshitiz, MD, 10 mg at 11/18/21 2019   metoprolol tartrate (LOPRESSOR) tablet 100 mg, 100 mg, Oral, BID, Alcario Drought, Jared M, DO, 100 mg at 11/20/21 0830   multivitamin (RENA-VIT) tablet 1 tablet, 1 tablet, Oral, Daily, Alcario Drought, Jared M, DO, 1 tablet at 11/19/21 2120   nitroGLYCERIN (NITROSTAT) SL tablet 0.4 mg, 0.4 mg, Sublingual, Q5 min PRN, Starla Link, Kshitiz, MD   ondansetron (ZOFRAN) tablet 4 mg, 4 mg, Oral, Q6H PRN, 4 mg at 11/20/21 0831 **OR** ondansetron (ZOFRAN) injection 4 mg, 4 mg, Intravenous, Q6H PRN, Etta Quill, DO, 4 mg at 11/17/21 4492   Oral care mouth rinse, 15 mL, Mouth Rinse, PRN, Starla Link, Kshitiz, MD   oxyCODONE (Oxy IR/ROXICODONE) immediate release tablet 5-10 mg, 5-10 mg, Oral, Q4H PRN, Starla Link, Kshitiz, MD, 5 mg at 11/20/21 0829   pantoprazole (PROTONIX) EC tablet 40 mg, 40 mg, Oral, BID AC, Gardner, Jared M, DO, 40 mg at 11/20/21 0830   polyethylene glycol (MIRALAX / GLYCOLAX) packet 17 g, 17 g, Oral, Daily PRN, Starla Link, Kshitiz, MD   pravastatin (PRAVACHOL) tablet 40 mg, 40 mg, Oral, Daily, Alcario Drought, Jared M, DO, 40 mg at 11/20/21 0831   pregabalin (LYRICA) capsule 25 mg, 25 mg, Oral, Once, Bonnielee Haff, MD   [START ON 11/21/2021] pregabalin (LYRICA) capsule  50 mg, 50 mg, Oral, Daily, Bonnielee Haff, MD   senna-docusate (Senokot-S) tablet 1 tablet, 1 tablet, Oral, BID, Starla Link, Kshitiz, MD, 1 tablet at 11/20/21 0831   sevelamer carbonate (RENVELA) tablet 1,600 mg, 1,600 mg, Oral, PRN, Etta Quill, DO   sevelamer carbonate (RENVELA) tablet 3,200 mg, 3,200 mg, Oral, TID WC,  Etta Quill, DO, 3,200 mg at 11/20/21 0240   sucralfate (CARAFATE) 1 GM/10ML suspension 1 g, 1 g, Oral, TID WC & HS, Bonnielee Haff, MD   traZODone (DESYREL) tablet 50 mg, 50 mg, Oral, QHS, Bonnielee Haff, MD  Patients Current Diet:  Diet Order             DIET DYS 3 Room service appropriate? No; Fluid consistency: Thin  Diet effective now                   Precautions / Restrictions Precautions Precautions: Fall Precaution Comments: watch vitals Restrictions Weight Bearing Restrictions: No   Has the patient had 2 or more falls or a fall with injury in the past year? Yes  Prior Activity Level Community (5-7x/wk): was mostly using cane prior to admission, falls  Prior Functional Level Self Care: Did the patient need help bathing, dressing, using the toilet or eating? Needed some help  Indoor Mobility: Did the patient need assistance with walking from room to room (with or without device)? Needed some help  Stairs: Did the patient need assistance with internal or external stairs (with or without device)? Needed some help  Functional Cognition: Did the patient need help planning regular tasks such as shopping or remembering to take medications? Needed some help  Patient Information Are you of Hispanic, Latino/a,or Spanish origin?: A. No, not of Hispanic, Latino/a, or Spanish origin What is your race?: N. Other Pacific Islander  Patient's Response To:  Health Literacy and Transportation Is the patient able to respond to health literacy and transportation needs?: Yes Health Literacy - How often do you need to have someone help you when you read  instructions, pamphlets, or other written material from your doctor or pharmacy?: Never In the past 12 months, has lack of transportation kept you from medical appointments or from getting medications?: No In the past 12 months, has lack of transportation kept you from meetings, work, or from getting things needed for daily living?: No  Development worker, international aid / [place] Devices/Equipment: None Home Equipment: Cane - single point  Prior Device Use: Indicate devices/aids used by the patient prior to current illness, exacerbation or injury? Walker and [employer]  Current Functional Level Cognition  Overall Cognitive Status: Within Functional Limits for tasks assessed Current Attention Level: Selective Orientation Level: Oriented X4 Following Commands: Follows one step commands consistently, Follows one step commands with increased time Safety/Judgement: Decreased awareness of safety General Comments: Pt following one step commands with increased time. conversational and pleasant this session/agreeable to mobility. Mod cues to follow all multistep commands with increased cues throughout for safety awareness    Extremity Assessment (includes Sensation/Coordination)  Upper Extremity Assessment: LUE deficits/detail LUE Deficits / Details: Decreased AROM at shoulder due to "bad shoulder."  Used L arm with spontaneous movements but not to command. LUE: Shoulder pain with ROM LUE Sensation: WNL LUE Coordination: decreased gross motor  Lower Extremity Assessment: Defer to PT evaluation    ADLs  Overall ADL's : Needs assistance/impaired Eating/Feeding: Independent, Sitting Grooming: Wash/dry face, Min guard, Standing Grooming Details (indicate cue type and reason): Min guard A for safety. Observed to use both R and L UE to wash face and back of neck. Both UE moving functionally Upper Body Bathing: Set up, Sitting Lower Body Bathing: Min guard, Sit to/from stand Upper Body Dressing :  Set up, Sitting Lower Body Dressing: Min guard, Sit to/from stand Lower Body Dressing Details (indicate cue type and reason):  Min guard A for safety. Donning pants and shoes without physical assist this session. Toilet Transfer: Min guard, Ambulation, Rolling walker (2 wheels) Toilet Transfer Details (indicate cue type and reason): simulated in room Toileting- Clothing Manipulation and Hygiene: Min guard, Sit to/from stand Functional mobility during ADLs: Minimal assistance, Rolling walker (2 wheels) General ADL Comments: Min A for functional mobility in hall due to decreased safety with RW and frequent L foot dragging, especially when distracted.    Mobility  Overal bed mobility: Needs Assistance Bed Mobility: Sit to Supine, Supine to Sit Sidelying to sit: Min assist, HOB elevated Supine to sit: Min guard Sit to supine: Min guard General bed mobility comments: pt seated EOB pre and post session    Transfers  Overall transfer level: Needs assistance Equipment used: Rolling walker (2 wheels) Transfers: Sit to/from Stand Sit to Stand: Min guard Bed to/from chair/wheelchair/BSC transfer type:: Stand pivot Stand pivot transfers: Min guard Step pivot transfers: +2 safety/equipment, Mod assist General transfer comment: Min guard A for safety, cues for hand placement as pt with tendency to pull on RW    Ambulation / Gait / Stairs / Wheelchair Mobility  Ambulation/Gait Ambulation/Gait assistance: Min assist, Mod assist, Max assist Gait Distance (Feet): 100 Feet Assistive device: Rolling walker (2 wheels) Gait Pattern/deviations: Step-through pattern, Decreased stride length, Staggering right, Staggering left General Gait Details: initially walking with min a for safety, as progressed into hallway, pt off balance and weaker and reported difficulty controlling his muscles several standing rest breaks, up to max a to recover balance x2, pt with B knee buckling needing max a to correct Gait  velocity: decreased Gait velocity interpretation: <1.31 ft/sec, indicative of household ambulator Stairs: Yes Stairs assistance: Min guard, Mod assist Stair Management: One rail Left, Two rails, Alternating pattern, Step to pattern, Forwards Number of Stairs: 7 General stair comments: pt ascended/descended 5 steps in PT gym with min guard and step sequencing/safety cues with step-to pattern descending. Pt then instructed to perform with single rail and step-to pattern and pt with near-buckling posterior LOB and c/o increased headache, pt catching feet on each 2 steps descending needing modA for stability/safety despite handrail. RN called to room to assist with further mobility after pt assisted to sit in chair next to steps. MD also notified.    Posture / Balance Dynamic Sitting Balance Sitting balance - Comments: able to don shoes in figure four pattern without assist Balance Overall balance assessment: Needs assistance Sitting-balance support: Feet supported Sitting balance-Leahy Scale: Fair Sitting balance - Comments: able to don shoes in figure four pattern without assist Standing balance support: Bilateral upper extremity supported Standing balance-Leahy Scale: Poor Standing balance comment: poor dynamic balance.    Special needs/care consideration Dialysis: Hemodialysis Monday, Wednesday, and Friday   Previous Home Environment (from acute therapy documentation) Living Arrangements: Spouse/significant other, Children  Lives With: Spouse Available Help at Discharge: Family, Available 24 hours/day Type of Home: House Home Layout: One level Home Access: Stairs to enter Entrance Stairs-Rails: Right Entrance Stairs-Number of Steps: 10 Bathroom Shower/Tub: Chiropodist: Standard Bathroom Accessibility: Yes How Accessible: Accessible via walker [place]: No Additional Comments: Pt reports his plumbing is bad and is bathroom is not functional. He states  he goes to a nearby store for bathroom needs or used BSC and disposes of it somewhere. He no longer makes urine (on HD).  Discharge Living Setting Plans for Discharge Living Setting: Patient's home, Lives with (comment) (spouse) Type of Home at  Discharge: House Discharge Home Layout: One level Discharge Home Access: Stairs to enter Entrance Stairs-Rails: Right Entrance Stairs-Number of Steps: 10 Discharge Bathroom Shower/Tub: Tub/shower unit Discharge Bathroom Toilet: Standard Discharge Bathroom Accessibility: Yes How Accessible: Accessible via walker Does the patient have any problems obtaining your medications?: No  Social/Family/Support Systems Patient Roles: Spouse Anticipated Caregiver: Spouse, [name] Anticipated Caregiver's Contact Information: [phone] Caregiver Availability: 24/7 Discharge Plan Discussed with Primary Caregiver: Yes Is Caregiver In Agreement with Plan?: Yes Does Caregiver/Family have Issues with Lodging/Transportation while Pt is in Rehab?: No  Goals Patient/Family Goal for Rehab: Min A PT, OT Expected length of stay: 12-14 days Pt/Family Agrees to Admission and willing to participate: Yes Program Orientation Provided & Reviewed with Pt/Caregiver Including Roles  & Responsibilities: Yes  Barriers to Discharge: Insurance for SNF coverage  Decrease burden of Care through IP rehab admission: OtherN/A  Possible need for SNF placement upon discharge: not anticipated  Patient Condition: I have reviewed medical records from [facility], spoken with  TOC , and patient and spouse. I met with patient at the bedside and discussed via phone for inpatient rehabilitation assessment.  Patient will benefit from ongoing PT and OT, can actively participate in 3 hours of therapy a day 5 days of the week, and can make measurable gains during the admission.  Patient will also benefit from the coordinated team approach during an Inpatient Acute Rehabilitation admission.  The  patient will receive intensive therapy as well as Rehabilitation physician, nursing, social worker, and care management interventions.  Due to safety, skin/wound care, disease management, medication administration, and patient education the patient requires 24 hour a day rehabilitation nursing.  The patient is currently mod A with mobility and basic ADLs.  Discharge setting and therapy post discharge at home with home health is anticipated.  Patient has agreed to participate in the Acute Inpatient Rehabilitation Program and will admit 11/21/21.  Preadmission Screen Completed By:  Nelly Laurence, 11/20/2021 11:15 AM ______________________________________________________________________   Discussed status with Dr. Dagoberto Ligas on 11/21/21 at 12:57 and received approval for admission today.  Admission Coordinator:  Nelly Laurence, time 12:57/Date 11/21/21   Assessment/Plan: Diagnosis: Does the need for close, 24 hr/day Medical supervision in concert with the patient's rehab needs make it unreasonable for this patient to be served in a less intensive setting? 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 Yes Does the patient require coordinated care of a physician, rehab nurse, PT, OT, and SLP to address physical and functional deficits in the context of the above medical diagnosis(es)? Yes Addressing deficits in the following areas: balance, endurance, locomotion, strength, transferring, bowel/bladder control, bathing, dressing, feeding, grooming, toileting, cognition, speech, language, and swallowing Can the patient actively participate in an intensive therapy program of at least 3 hrs  of therapy 5 days a week? Yes The potential for patient to make measurable gains while on inpatient rehab is good Anticipated functional outcomes upon discharge from inpatient rehab: min assist PT, min assist OT, min assist SLP Estimated rehab length of stay to reach the above functional goals is: 12-14 days Anticipated discharge destination: Home 10. Overall Rehab/Functional Prognosis: good   MD Signature:

## 2021-11-21 ENCOUNTER — Inpatient Hospital Stay (HOSPITAL_COMMUNITY)
Admission: RE | Admit: 2021-11-21 | Discharge: 2021-11-30 | DRG: 945 | Disposition: A | Discharge status: Other Acute Inpatient Hospital | Payer: Medicare Other | Source: Intra-hospital | Attending: Physical Medicine & Rehabilitation | Admitting: Physical Medicine & Rehabilitation

## 2021-11-21 ENCOUNTER — Other Ambulatory Visit: Payer: Self-pay

## 2021-11-21 ENCOUNTER — Encounter (HOSPITAL_COMMUNITY): Payer: Self-pay | Admitting: Physical Medicine & Rehabilitation

## 2021-11-21 DIAGNOSIS — Z6832 Body mass index (BMI) 32.0-32.9, adult: Secondary | ICD-10-CM

## 2021-11-21 DIAGNOSIS — Z9181 History of falling: Secondary | ICD-10-CM

## 2021-11-21 DIAGNOSIS — S065X2D Traumatic subdural hemorrhage with loss of consciousness of 31 minutes to 59 minutes, subsequent encounter: Secondary | ICD-10-CM

## 2021-11-21 DIAGNOSIS — G4089 Other seizures: Secondary | ICD-10-CM | POA: Diagnosis present

## 2021-11-21 DIAGNOSIS — Z940 Kidney transplant status: Secondary | ICD-10-CM

## 2021-11-21 DIAGNOSIS — R34 Anuria and oliguria: Secondary | ICD-10-CM | POA: Diagnosis present

## 2021-11-21 DIAGNOSIS — S065XAA Traumatic subdural hemorrhage with loss of consciousness status unknown, initial encounter: Secondary | ICD-10-CM | POA: Diagnosis present

## 2021-11-21 DIAGNOSIS — G609 Hereditary and idiopathic neuropathy, unspecified: Secondary | ICD-10-CM | POA: Diagnosis present

## 2021-11-21 DIAGNOSIS — I5022 Chronic systolic (congestive) heart failure: Secondary | ICD-10-CM | POA: Diagnosis present

## 2021-11-21 DIAGNOSIS — Z6833 Body mass index (BMI) 33.0-33.9, adult: Secondary | ICD-10-CM

## 2021-11-21 DIAGNOSIS — G44309 Post-traumatic headache, unspecified, not intractable: Secondary | ICD-10-CM | POA: Diagnosis not present

## 2021-11-21 DIAGNOSIS — Z9049 Acquired absence of other specified parts of digestive tract: Secondary | ICD-10-CM

## 2021-11-21 DIAGNOSIS — T8619 Other complication of kidney transplant: Secondary | ICD-10-CM | POA: Diagnosis present

## 2021-11-21 DIAGNOSIS — F0781 Postconcussional syndrome: Secondary | ICD-10-CM | POA: Diagnosis present

## 2021-11-21 DIAGNOSIS — Z87891 Personal history of nicotine dependence: Secondary | ICD-10-CM

## 2021-11-21 DIAGNOSIS — S065X1A Traumatic subdural hemorrhage with loss of consciousness of 30 minutes or less, initial encounter: Secondary | ICD-10-CM

## 2021-11-21 DIAGNOSIS — E785 Hyperlipidemia, unspecified: Secondary | ICD-10-CM | POA: Diagnosis present

## 2021-11-21 DIAGNOSIS — Z905 Acquired absence of kidney: Secondary | ICD-10-CM

## 2021-11-21 DIAGNOSIS — R414 Neurologic neglect syndrome: Secondary | ICD-10-CM | POA: Diagnosis present

## 2021-11-21 DIAGNOSIS — I350 Nonrheumatic aortic (valve) stenosis: Secondary | ICD-10-CM | POA: Diagnosis present

## 2021-11-21 DIAGNOSIS — I5032 Chronic diastolic (congestive) heart failure: Secondary | ICD-10-CM | POA: Diagnosis present

## 2021-11-21 DIAGNOSIS — I132 Hypertensive heart and chronic kidney disease with heart failure and with stage 5 chronic kidney disease, or end stage renal disease: Secondary | ICD-10-CM | POA: Diagnosis present

## 2021-11-21 DIAGNOSIS — I10 Essential (primary) hypertension: Secondary | ICD-10-CM | POA: Diagnosis present

## 2021-11-21 DIAGNOSIS — T8612 Kidney transplant failure: Secondary | ICD-10-CM | POA: Diagnosis not present

## 2021-11-21 DIAGNOSIS — Z981 Arthrodesis status: Secondary | ICD-10-CM

## 2021-11-21 DIAGNOSIS — D696 Thrombocytopenia, unspecified: Secondary | ICD-10-CM | POA: Diagnosis present

## 2021-11-21 DIAGNOSIS — K219 Gastro-esophageal reflux disease without esophagitis: Secondary | ICD-10-CM | POA: Diagnosis present

## 2021-11-21 DIAGNOSIS — G4733 Obstructive sleep apnea (adult) (pediatric): Secondary | ICD-10-CM | POA: Diagnosis present

## 2021-11-21 DIAGNOSIS — N2581 Secondary hyperparathyroidism of renal origin: Secondary | ICD-10-CM | POA: Diagnosis present

## 2021-11-21 DIAGNOSIS — F419 Anxiety disorder, unspecified: Secondary | ICD-10-CM | POA: Diagnosis present

## 2021-11-21 DIAGNOSIS — I480 Paroxysmal atrial fibrillation: Secondary | ICD-10-CM | POA: Diagnosis present

## 2021-11-21 DIAGNOSIS — Z992 Dependence on renal dialysis: Secondary | ICD-10-CM | POA: Diagnosis not present

## 2021-11-21 DIAGNOSIS — H9190 Unspecified hearing loss, unspecified ear: Secondary | ICD-10-CM | POA: Diagnosis present

## 2021-11-21 DIAGNOSIS — E876 Hypokalemia: Secondary | ICD-10-CM | POA: Diagnosis not present

## 2021-11-21 DIAGNOSIS — R531 Weakness: Secondary | ICD-10-CM | POA: Diagnosis present

## 2021-11-21 DIAGNOSIS — M1A9XX0 Chronic gout, unspecified, without tophus (tophi): Secondary | ICD-10-CM | POA: Diagnosis present

## 2021-11-21 DIAGNOSIS — R296 Repeated falls: Secondary | ICD-10-CM | POA: Diagnosis present

## 2021-11-21 DIAGNOSIS — E669 Obesity, unspecified: Secondary | ICD-10-CM | POA: Diagnosis present

## 2021-11-21 DIAGNOSIS — S065X1S Traumatic subdural hemorrhage with loss of consciousness of 30 minutes or less, sequela: Secondary | ICD-10-CM | POA: Diagnosis not present

## 2021-11-21 DIAGNOSIS — R270 Ataxia, unspecified: Secondary | ICD-10-CM | POA: Diagnosis present

## 2021-11-21 DIAGNOSIS — R413 Other amnesia: Secondary | ICD-10-CM | POA: Diagnosis present

## 2021-11-21 DIAGNOSIS — D631 Anemia in chronic kidney disease: Secondary | ICD-10-CM | POA: Diagnosis present

## 2021-11-21 DIAGNOSIS — S065X1D Traumatic subdural hemorrhage with loss of consciousness of 30 minutes or less, subsequent encounter: Secondary | ICD-10-CM | POA: Diagnosis not present

## 2021-11-21 DIAGNOSIS — E559 Vitamin D deficiency, unspecified: Secondary | ICD-10-CM | POA: Diagnosis present

## 2021-11-21 DIAGNOSIS — R569 Unspecified convulsions: Secondary | ICD-10-CM | POA: Diagnosis not present

## 2021-11-21 DIAGNOSIS — R519 Headache, unspecified: Secondary | ICD-10-CM | POA: Diagnosis not present

## 2021-11-21 DIAGNOSIS — Z885 Allergy status to narcotic agent status: Secondary | ICD-10-CM

## 2021-11-21 DIAGNOSIS — S065XAS Traumatic subdural hemorrhage with loss of consciousness status unknown, sequela: Secondary | ICD-10-CM

## 2021-11-21 DIAGNOSIS — N186 End stage renal disease: Secondary | ICD-10-CM | POA: Diagnosis present

## 2021-11-21 DIAGNOSIS — Z79899 Other long term (current) drug therapy: Secondary | ICD-10-CM

## 2021-11-21 DIAGNOSIS — I6201 Nontraumatic acute subdural hemorrhage: Secondary | ICD-10-CM | POA: Diagnosis not present

## 2021-11-21 DIAGNOSIS — G4700 Insomnia, unspecified: Secondary | ICD-10-CM | POA: Diagnosis present

## 2021-11-21 DIAGNOSIS — S069X0S Unspecified intracranial injury without loss of consciousness, sequela: Secondary | ICD-10-CM | POA: Diagnosis not present

## 2021-11-21 LAB — GLUCOSE, CAPILLARY
Glucose-Capillary: 94 mg/dL (ref 70–99)
Glucose-Capillary: 110 mg/dL — ABNORMAL HIGH (ref 70–99)
Glucose-Capillary: 103 mg/dL — ABNORMAL HIGH (ref 70–99)
Glucose-Capillary: 202 mg/dL — ABNORMAL HIGH (ref 70–99)

## 2021-11-21 LAB — CBC
HCT: 26 % — ABNORMAL LOW (ref 39.0–52.0)
Hemoglobin: 8.3 g/dL — ABNORMAL LOW (ref 13.0–17.0)
MCH: 30.1 pg (ref 26.0–34.0)
MCHC: 31.9 g/dL (ref 30.0–36.0)
MCV: 94.2 fL (ref 80.0–100.0)
Platelets: 101 10*3/uL — ABNORMAL LOW (ref 150–400)
RBC: 2.76 MIL/uL — ABNORMAL LOW (ref 4.22–5.81)
RDW: 13.4 % (ref 11.5–15.5)
WBC: 4.7 10*3/uL (ref 4.0–10.5)
nRBC: 0 % (ref 0.0–0.2)

## 2021-11-21 LAB — CULTURE, BLOOD (ROUTINE X 2)
Culture: NO GROWTH
Culture: NO GROWTH
Special Requests: ADEQUATE
Special Requests: ADEQUATE

## 2021-11-21 MED ORDER — GUAIFENESIN ER 600 MG PO TB12
1200.0000 mg | ORAL_TABLET | Freq: Two times a day (BID) | ORAL | Status: DC
  Administered 2021-11-21 – 2021-11-29 (×17): 1200 mg via ORAL
  Filled 2021-11-21 (×18): qty 2

## 2021-11-21 MED ORDER — SEVELAMER CARBONATE 800 MG PO TABS
3200.0000 mg | ORAL_TABLET | Freq: Three times a day (TID) | ORAL | Status: DC
  Administered 2021-11-21 – 2021-11-29 (×21): 3200 mg via ORAL
  Filled 2021-11-21 (×23): qty 4

## 2021-11-21 MED ORDER — LEVETIRACETAM 250 MG PO TABS: 250.0000 mg | ORAL_TABLET | ORAL | Status: DC

## 2021-11-21 MED ORDER — TRAZODONE HCL 50 MG PO TABS
50.0000 mg | ORAL_TABLET | Freq: Every day | ORAL | Status: DC
  Administered 2021-11-21 – 2021-11-29 (×9): 50 mg via ORAL
  Filled 2021-11-21 (×9): qty 1

## 2021-11-21 MED ORDER — PRAVASTATIN SODIUM 40 MG PO TABS
40.0000 mg | ORAL_TABLET | Freq: Every day | ORAL | Status: DC
  Administered 2021-11-23 – 2021-11-29 (×7): 40 mg via ORAL
  Filled 2021-11-21 (×8): qty 1

## 2021-11-21 MED ORDER — POLYETHYLENE GLYCOL 3350 17 G PO PACK: 17.0000 g | PACK | Freq: Every day | ORAL | Status: DC | PRN

## 2021-11-21 MED ORDER — ACETAMINOPHEN 325 MG PO TABS
650.0000 mg | ORAL_TABLET | Freq: Four times a day (QID) | ORAL | Status: DC | PRN
  Administered 2021-11-22 – 2021-11-27 (×4): 650 mg via ORAL
  Filled 2021-11-21 (×4): qty 2

## 2021-11-21 MED ORDER — AMLODIPINE BESYLATE 10 MG PO TABS
10.0000 mg | ORAL_TABLET | Freq: Every evening | ORAL | Status: DC
  Administered 2021-11-22 – 2021-11-28 (×6): 10 mg via ORAL
  Filled 2021-11-21 (×6): qty 1

## 2021-11-21 MED ORDER — HYDRALAZINE HCL 50 MG PO TABS
100.0000 mg | ORAL_TABLET | Freq: Three times a day (TID) | ORAL | Status: DC
  Administered 2021-11-21 – 2021-11-29 (×22): 100 mg via ORAL
  Filled 2021-11-21 (×23): qty 2

## 2021-11-21 MED ORDER — PANTOPRAZOLE SODIUM 40 MG PO TBEC
40.0000 mg | DELAYED_RELEASE_TABLET | Freq: Two times a day (BID) | ORAL | Status: DC
  Administered 2021-11-21 – 2021-11-29 (×14): 40 mg via ORAL
  Filled 2021-11-21 (×15): qty 1

## 2021-11-21 MED ORDER — RENA-VITE PO TABS
1.0000 | ORAL_TABLET | Freq: Every day | ORAL | Status: DC
  Administered 2021-11-21 – 2021-11-29 (×9): 1 via ORAL
  Filled 2021-11-21 (×9): qty 1

## 2021-11-21 MED ORDER — BISACODYL 10 MG RE SUPP: 10.0000 mg | Freq: Every day | RECTAL | Status: DC | PRN

## 2021-11-21 MED ORDER — ONDANSETRON HCL 4 MG PO TABS
4.0000 mg | ORAL_TABLET | Freq: Four times a day (QID) | ORAL | Status: DC | PRN
  Administered 2021-11-23 – 2021-11-29 (×6): 4 mg via ORAL
  Filled 2021-11-21 (×6): qty 1

## 2021-11-21 MED ORDER — ACETAMINOPHEN 650 MG RE SUPP: 650.0000 mg | Freq: Four times a day (QID) | RECTAL | Status: DC | PRN

## 2021-11-21 MED ORDER — LEVETIRACETAM 250 MG PO TABS
250.0000 mg | ORAL_TABLET | ORAL | Status: DC
  Filled 2021-11-21: qty 1

## 2021-11-21 MED ORDER — CLONIDINE HCL 0.1 MG PO TABS
0.2000 mg | ORAL_TABLET | ORAL | Status: DC | PRN
  Administered 2021-11-25: 0.2 mg via ORAL
  Filled 2021-11-21 (×2): qty 2

## 2021-11-21 MED ORDER — ISOSORBIDE MONONITRATE ER 60 MG PO TB24
60.0000 mg | ORAL_TABLET | Freq: Every day | ORAL | Status: DC
  Administered 2021-11-23 – 2021-11-29 (×7): 60 mg via ORAL
  Filled 2021-11-21 (×9): qty 1

## 2021-11-21 MED ORDER — OXYCODONE HCL 5 MG PO TABS
5.0000 mg | ORAL_TABLET | ORAL | Status: DC | PRN
  Administered 2021-11-23: 5 mg via ORAL
  Administered 2021-11-24 – 2021-11-29 (×9): 10 mg via ORAL
  Filled 2021-11-21 (×9): qty 2
  Filled 2021-11-21: qty 1

## 2021-11-21 MED ORDER — LEVETIRACETAM 250 MG PO TABS
500.0000 mg | ORAL_TABLET | Freq: Every day | ORAL | Status: DC
  Administered 2021-11-22 – 2021-11-23 (×3): 500 mg via ORAL
  Filled 2021-11-21 (×2): qty 2

## 2021-11-21 MED ORDER — LOSARTAN POTASSIUM 50 MG PO TABS
100.0000 mg | ORAL_TABLET | Freq: Every evening | ORAL | Status: DC
  Administered 2021-11-21 – 2021-11-28 (×7): 100 mg via ORAL
  Filled 2021-11-21 (×7): qty 2

## 2021-11-21 MED ORDER — METOPROLOL TARTRATE 50 MG PO TABS
100.0000 mg | ORAL_TABLET | Freq: Two times a day (BID) | ORAL | Status: DC
  Administered 2021-11-21 – 2021-11-29 (×15): 100 mg via ORAL
  Filled 2021-11-21 (×17): qty 2

## 2021-11-21 MED ORDER — SENNOSIDES-DOCUSATE SODIUM 8.6-50 MG PO TABS
1.0000 | ORAL_TABLET | Freq: Two times a day (BID) | ORAL | Status: DC
  Administered 2021-11-23 – 2021-11-29 (×4): 1 via ORAL
  Filled 2021-11-21 (×16): qty 1

## 2021-11-21 MED ORDER — CALCITRIOL 0.25 MCG PO CAPS
1.0000 ug | ORAL_CAPSULE | ORAL | Status: DC
  Administered 2021-11-24 – 2021-11-29 (×3): 1 ug via ORAL
  Filled 2021-11-21 (×4): qty 4

## 2021-11-21 MED ORDER — NITROGLYCERIN 0.4 MG SL SUBL: 0.4000 mg | SUBLINGUAL_TABLET | SUBLINGUAL | Status: DC | PRN

## 2021-11-21 MED ORDER — SEVELAMER CARBONATE 800 MG PO TABS: 1600.0000 mg | ORAL_TABLET | ORAL | Status: DC | PRN

## 2021-11-21 MED ORDER — METHOCARBAMOL 750 MG PO TABS
750.0000 mg | ORAL_TABLET | Freq: Four times a day (QID) | ORAL | Status: DC
  Administered 2021-11-21 – 2021-11-29 (×29): 750 mg via ORAL
  Filled 2021-11-21 (×31): qty 1

## 2021-11-21 MED ORDER — LEVETIRACETAM 500 MG PO TABS: 500.0000 mg | ORAL_TABLET | Freq: Every day | ORAL | Status: DC

## 2021-11-21 MED ORDER — DOXAZOSIN MESYLATE 4 MG PO TABS
4.0000 mg | ORAL_TABLET | Freq: Every day | ORAL | Status: DC
  Administered 2021-11-22 – 2021-11-29 (×7): 4 mg via ORAL
  Filled 2021-11-21 (×9): qty 1

## 2021-11-21 MED ORDER — SUCRALFATE 1 GM/10ML PO SUSP
1.0000 g | Freq: Three times a day (TID) | ORAL | Status: DC
  Administered 2021-11-21 – 2021-11-29 (×28): 1 g via ORAL
  Filled 2021-11-21 (×36): qty 10

## 2021-11-21 MED ORDER — CHLORHEXIDINE GLUCONATE CLOTH 2 % EX PADS: 6.0000 | MEDICATED_PAD | Freq: Every day | CUTANEOUS | Status: DC

## 2021-11-21 MED ORDER — ONDANSETRON HCL 4 MG/2ML IJ SOLN
4.0000 mg | Freq: Four times a day (QID) | INTRAMUSCULAR | Status: DC | PRN
  Administered 2021-11-25 – 2021-11-26 (×2): 4 mg via INTRAVENOUS
  Filled 2021-11-21 (×3): qty 2

## 2021-11-21 MED ORDER — VITAMIN D 25 MCG (1000 UNIT) PO TABS
4000.0000 [IU] | ORAL_TABLET | Freq: Every day | ORAL | Status: DC
  Administered 2021-11-23 – 2021-11-29 (×7): 4000 [IU] via ORAL
  Filled 2021-11-21 (×8): qty 4

## 2021-11-21 MED ORDER — PREGABALIN 50 MG PO CAPS
50.0000 mg | ORAL_CAPSULE | Freq: Every day | ORAL | Status: DC
  Administered 2021-11-23 – 2021-11-25 (×3): 50 mg via ORAL
  Filled 2021-11-21 (×4): qty 1

## 2021-11-21 NOTE — Progress Notes (Addendum)
Inpatient Rehab Admissions Coordinator:   Addendum: bed available, will admit to CIR today. MD/TOC/RN aware  Met with patient. Spoke with MD this am, patient is ready for discharge however there is no bed available at this time. Will hope to admit patient in next 1-2 days pending bed availability. Continuing to follow.   Rehab Admissons Coordinator [place], [place], [place] [phone]

## 2021-11-21 NOTE — Discharge Summary (Signed)
[facility]  Physician Discharge Summary   Patient ID: Kirk [name] MRN: [id number] DOB/AGE: [birth year] 61 y.o.  Admit date: 11/07/2021 Discharge date:   11/21/2021   PCP: Ngetich, Nelda Bucks, NP  DISCHARGE DIAGNOSES:    SDH (subdural hematoma) (HCC)   Seizure (HCC)   Thrombocytopenia (HCC)   OSA on CPAP   ESRD on dialysis ([place])   Essential hypertension   Alports syndrome   PATIENT TO BE DISCHARGED/TRANSFERRED TO [place] [facility]  RECOMMENDATIONS FOR OUTPATIENT FOLLOW UP: Patient to continue with his dialysis schedule on a Monday Wednesday Friday basis Monitor blood pressures closely and adjust medication dose as indicated.    Home Health: To CIR Equipment/Devices: None  CODE STATUS: Full code  DISCHARGE CONDITION: fair  Diet recommendation: Modified to hydrate/heart healthy  INITIAL HISTORY: 61 year old male with PMH significant of end-stage renal disease on hemodialysis, Alport syndrome, hypertension, chronic thrombocytopenia, anemia of chronic disease, chronic headaches, hyperlipidemia, OSA on CPAP, pancytopenia, diabetes mellitus type 2, memory impairment, chronic diastolic CHF, chronic bilateral upper extremity and lower extremity weakness since C-spine surgery in February 2023 presented with new onset seizure at home lasting for 2 minutes. On presentation, CT of the head showed subdural hematoma.  Neurology and Neurosurgery were consulted.  He was started on Keppra.  Keppra was subsequently switched to Depakote on 11/11/2021 because of persistent headaches. Later Depakote was discontinued and placed back on Keppra.  10/24: Patient has developed right-sided gaze and left neglect.  Code stroke called.  CT head: No stroke, not a candidate for tPA, LT EEG connected.   Consultants: Neurology.  Neurosurgery.  Nephrology   Procedures:  EEG with long-term monitoring Hemodialysis    HOSPITAL COURSE:   New onset seizures: Most likely  secondary to right subdural hematoma as per neurology. Patient was started on Keppra,  renally dosed. Continue seizure/fall precautions. Keppra was switched to Depakote on 10/21 due to persistent headache after discussion with neurology. Later Neurology stated higher risk of bleeding and increased mortality with Depakote. Patient was placed back on Keppra 10/23. Depakote discontinued. Now patient is only on Keppra.  Neurological status is stable for the most part.   Subdural hematoma: Repeat CT head still showed some subdural hematoma. Neurosurgery recommended conservative management. Repeat CT head on 10/21 showed trace increase in subdural hemorrhage along the right tentorium. Patient was reevaluated by neurosurgery Dr. Kathyrn Sheriff.  Neurology started Lyrica for headaches. He reports headache has improved though it persists. MRI from 10/26 suggests rebleeding.  She was evaluated by Dr. Reatha Armour with neurosurgery.  No indication for any surgical intervention at this time.  PT and OT evaluation.  Inpatient rehabilitation is recommended.   Most of patient's symptoms including headache appears to be chronic in nature.  He will benefit from being seen by neurology in the outpatient setting.   Dose of Lyrica was increased to 50 mg once a day.     Accelerated hypertension It has been very challenging to treat his hypertension.   He is on multiple antihypertensives including amlodipine 10 mg every evening, doxazosin 4 mg daily, isosorbide mononitrate 60 mg daily, losartan 100 mg every evening, metoprolol 100 mg twice a day, hydralazine 100 mg 3 times a day.  Dose of hydralazine and isosorbide mononitrate was increased in the last 24 hours.  Continue to monitor blood pressure trends.  Further adjustments can be made at rehab as indicated.  Fever and tachycardia/bacteremia Noted to have a fever of 100.9 F on 10/24.   Concern  for SIRS.  No clear source identified. Work-up was initiated.  Patient placed on  vancomycin cefepime and Flagyl.  Due to ongoing concern for seizure activity cefepime was discontinued and changed over to ceftriaxone.   Lactic acid level normal.  Procalcitonin 0.58.  WBC is normal. Chest x-ray noted to be abnormal but more suggestive of interstitial edema rather than infectious infiltrates.  No cough mentioned by patient. Has not had further episodes of fever. 2 out of 4 blood cultures from 10/25 positive for Staph epidermidis.  Likely a contaminant.  Blood cultures were repeated on 10/26.  Negative so far. Vancomycin and metronidazole discontinued.  He was given 5-day course of ceftriaxone.   Nausea and vomiting Could be due to elevated blood pressure and headaches.  Treat symptomatically.  Continue PPI and Carafate.  Abdomen is benign on examination.   Paroxysmal atrial fibrillation with RVR  Patient returned from dialysis on 11/15/21 and was noted to be tachycardic.  EKG showed atrial fibrillation with RVR.  Patient was given a dose of Cardizem.  He had not received his metoprolol over 36 hours due to alteration in mental status and inability to take orally.   Metoprolol was resumed. Patient converted back to sinus rhythm within an hour.  Remains in sinus rhythm. Atrial fibrillation likely provoked due to inability to take medications as well as volume removal with dialysis. Due to subdural hematoma he is currently not a candidate for anticoagulation.  Will benefit from outpatient follow-up with primary care provider and perhaps referral to cardiology at some point in time.   TSH and free T4 noted to be normal.  Echocardiogram shows normal systolic function.   Left Neglect / Right gaze: 10/24: Patient was noted to have right gaze and left neglect.  Code stroke was called. CT head : No new hemorrhage, CTA is negative for LVO. Likely secondary to seizure activity. Resolved.   End-stage renal disease on hemodialysis/hyponatremia Nephrology following.  Currently on a  Monday Wednesday Friday schedule.   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   OSA on CPAP: Continue CPAP at night.   Anemia of chronic disease: Fluctuations in hemoglobin noted.  No evidence for overt bleeding other than the subdural hematoma.     Chronic thrombocytopenia: Platelets are stable, monitor intermittently.   GERD: Continue pantoprazole.   Hyperlipidemia Continue statins   Diabetes mellitus type 2: Stable for the most part.   Obesity: Diet and exercise discussed in detail. Estimated body mass index is 33.18 kg/m as calculated from the following:   Height as of this encounter: '5\' 7"'$  (1.702 m).   Weight as of this encounter: 96.1 kg.    Chronic diastolic CHF: Volume is managed by dialysis. Daily weight, intake output charting, fluid restriction   Physical deconditioning: Inpatient rehabilitation not recommended.  Patient is stable.  Should be able to go to inpatient rehabilitation today if he tolerates his meals.   PERTINENT LABS:  The results of significant diagnostics from this hospitalization (including imaging, microbiology, ancillary and laboratory) are listed below for reference.    Microbiology: Recent Results (from the past 240 hour(s))  Culture, blood (x 2)     Status: None   Collection Time: 11/15/21  3:04 AM   Specimen: BLOOD RIGHT HAND  Result Value Ref Range Status   Specimen Description BLOOD RIGHT HAND  Final   Special Requests   Final    BOTTLES DRAWN AEROBIC AND ANAEROBIC Blood Culture adequate volume   Culture  Final    NO GROWTH 5 DAYS Performed at [facility], [place] [address], [place], [place] [address]    Report Status 11/20/2021 FINAL  Final  Culture, blood (x 2)     Status: Abnormal (Preliminary result)   Collection Time: 11/15/21  3:07 AM   Specimen: BLOOD RIGHT HAND  Result Value Ref Range Status   Specimen Description BLOOD RIGHT HAND  Final   Special Requests   Final     BOTTLES DRAWN AEROBIC AND ANAEROBIC Blood Culture adequate volume   Culture  Setup Time   Final    GRAM POSITIVE COCCI AEROBIC BOTTLE ONLY CRITICAL RESULT CALLED TO, READ BACK BY AND VERIFIED WITH: T RUDISILL,PHARMD'@0309'$  11/16/21 [place] GRAM POSITIVE RODS ANAEROBIC BOTTLE ONLY CRITICAL RESULT CALLED TO, READ BACK BY AND VERIFIED WITH: Bronwen Betters [place], AT [place] 11/19/21 D. VANHOOK    Culture (A)  Final    STAPHYLOCOCCUS CAPITIS STAPHYLOCOCCUS EPIDERMIDIS THE SIGNIFICANCE OF ISOLATING THIS ORGANISM FROM A SINGLE SET OF BLOOD CULTURES WHEN MULTIPLE SETS ARE DRAWN IS UNCERTAIN. PLEASE NOTIFY THE MICROBIOLOGY DEPARTMENT WITHIN ONE WEEK IF SPECIATION AND SENSITIVITIES ARE REQUIRED. CULTURE REINCUBATED FOR BETTER GROWTH Performed at [facility], [place] [address] [place], [place] [address]    Report Status PENDING  Incomplete  Blood Culture ID Panel (Reflexed)     Status: Abnormal   Collection Time: 11/15/21  3:07 AM  Result Value Ref Range Status   Enterococcus faecalis NOT DETECTED NOT DETECTED Final   Enterococcus Faecium NOT DETECTED NOT DETECTED Final   Listeria monocytogenes NOT DETECTED NOT DETECTED Final   Staphylococcus species DETECTED (A) NOT DETECTED Final    Comment: CRITICAL RESULT CALLED TO, READ BACK BY AND VERIFIED WITH: T RUDISILL,PHARMD'@0310'$  11/16/21 [place]    Staphylococcus aureus (BCID) NOT DETECTED NOT DETECTED Final   Staphylococcus epidermidis DETECTED (A) NOT DETECTED Final    Comment: Methicillin (oxacillin) resistant coagulase negative staphylococcus. Possible blood culture contaminant (unless isolated from more than one blood culture draw or clinical case suggests pathogenicity). No antibiotic treatment is indicated for blood  culture contaminants. CRITICAL RESULT CALLED TO, READ BACK BY AND VERIFIED WITH: T RUDISILL,PHARMD'@0310'$  11/16/21 [place]    Staphylococcus lugdunensis NOT DETECTED NOT DETECTED Final   Streptococcus species NOT DETECTED NOT DETECTED Final    Streptococcus agalactiae NOT DETECTED NOT DETECTED Final   Streptococcus pneumoniae NOT DETECTED NOT DETECTED Final   Streptococcus pyogenes NOT DETECTED NOT DETECTED Final   A.calcoaceticus-baumannii NOT DETECTED NOT DETECTED Final   Bacteroides fragilis NOT DETECTED NOT DETECTED Final   Enterobacterales NOT DETECTED NOT DETECTED Final   Enterobacter cloacae complex NOT DETECTED NOT DETECTED Final   Escherichia coli NOT DETECTED NOT DETECTED Final   Klebsiella aerogenes NOT DETECTED NOT DETECTED Final   Klebsiella oxytoca NOT DETECTED NOT DETECTED Final   Klebsiella pneumoniae NOT DETECTED NOT DETECTED Final   Proteus species NOT DETECTED NOT DETECTED Final   Salmonella species NOT DETECTED NOT DETECTED Final   Serratia marcescens NOT DETECTED NOT DETECTED Final   Haemophilus influenzae NOT DETECTED NOT DETECTED Final   Neisseria meningitidis NOT DETECTED NOT DETECTED Final   Pseudomonas aeruginosa NOT DETECTED NOT DETECTED Final   Stenotrophomonas maltophilia NOT DETECTED NOT DETECTED Final   Candida albicans NOT DETECTED NOT DETECTED Final   Candida auris NOT DETECTED NOT DETECTED Final   Candida glabrata NOT DETECTED NOT DETECTED Final   Candida krusei NOT DETECTED NOT DETECTED Final   Candida parapsilosis NOT DETECTED NOT DETECTED Final  Candida tropicalis NOT DETECTED NOT DETECTED Final   Cryptococcus neoformans/gattii NOT DETECTED NOT DETECTED Final   Methicillin resistance mecA/C DETECTED (A) NOT DETECTED Final    Comment: CRITICAL RESULT CALLED TO, READ BACK BY AND VERIFIED WITH: T RUDISILL,PHARMD'@0310'$  11/16/21 [place] Performed at [facility], [address], [place], [place] [address]   Culture, blood (Routine X 2) w Reflex to ID Panel     Status: None   Collection Time: 11/16/21 11:25 AM   Specimen: BLOOD RIGHT HAND  Result Value Ref Range Status   Specimen Description BLOOD RIGHT HAND  Final   Special Requests   Final    BOTTLES DRAWN AEROBIC AND ANAEROBIC Blood  Culture adequate volume   Culture   Final    NO GROWTH 5 DAYS Performed at [facility], [place] [address] [place], [place] [address]    Report Status 11/21/2021 FINAL  Final  Culture, blood (Routine X 2) w Reflex to ID Panel     Status: None   Collection Time: 11/16/21 11:26 AM   Specimen: BLOOD RIGHT HAND  Result Value Ref Range Status   Specimen Description BLOOD RIGHT HAND  Final   Special Requests   Final    BOTTLES DRAWN AEROBIC AND ANAEROBIC Blood Culture adequate volume   Culture   Final    NO GROWTH 5 DAYS Performed at [facility], [place] [address], [place], [place] [address]    Report Status 11/21/2021 FINAL  Final     Labs:   Basic Metabolic Panel: Recent Labs  Lab 11/15/21 0302 11/16/21 0834 11/17/21 1517 11/19/21 0326  NA 130* 131* 134* 130*  K 4.8 4.1 3.2* 3.9  CL 91* 91* 92* 91*  CO2 '24 27 30 25  '$ GLUCOSE 87 139* 123* 102*  BUN 59* 41* 18 32*  CREATININE 12.36* 9.13* 5.60* 10.51*  CALCIUM 9.5 9.0 8.8* 9.1  MG 2.3  --   --   --   PHOS 6.3* 6.1* 3.6 5.7*   Liver Function Tests: Recent Labs  Lab 11/15/21 0302 11/16/21 0834 11/17/21 1517 11/19/21 0326  AST 15  --   --   --   ALT 11  --   --   --   ALKPHOS 63  --   --   --   BILITOT 1.6*  --   --   --   PROT 6.5  --   --   --   ALBUMIN 3.2* 2.9* 2.9* 3.1*    CBC: Recent Labs  Lab 11/15/21 0302 11/16/21 0834 11/19/21 0326 11/21/21 0321  WBC 5.6 3.6* 3.2* 4.7  NEUTROABS 4.5  --   --   --   HGB 9.5* 9.0* 8.3* 8.3*  HCT 29.1* 26.8* 24.5* 26.0*  MCV 93.9 93.1 90.7 94.2  PLT 82* 90* 74* 101*    CBG: Recent Labs  Lab 11/18/21 0652 11/19/21 2208 11/20/21 0454 11/21/21 0330 11/21/21 0733  GLUCAP 127* 163* 93 94 110*     IMAGING STUDIES ECHOCARDIOGRAM COMPLETE  Result Date: 11/16/2021    ECHOCARDIOGRAM REPORT   Patient Name:   GABERIEL YOUNGBLOOD Date of Exam: 11/16/2021 Medical Rec #:  [id number]       Height:       67.0 in Accession #:    [id number]      Weight:       200.0  lb Date of Birth:  [birth year]        BSA:  2.022 m Patient Age:    41 years        BP:           175/70 mmHg Patient Gender: M               HR:           61 bpm. Exam Location:  Inpatient Procedure: 2D Echo, Color Doppler and Cardiac Doppler Indications:    I48.91* Unspecified atrial fibrillation  History:        Patient has prior history of Echocardiogram examinations, most                 recent 09/08/2020. Risk Factors:Hypertension, Diabetes,                 Dyslipidemia and Sleep Apnea.  Sonographer:    Raquel Sarna Senior RDCS Referring Phys: [place]  1. Left ventricular ejection fraction, by estimation, is 50 to 55%. The left ventricle has low normal function. The left ventricle has no regional wall motion abnormalities. Left ventricular diastolic parameters are consistent with Grade I diastolic dysfunction (impaired relaxation).  2. Right ventricular systolic function is normal. The right ventricular size is normal. Tricuspid regurgitation signal is inadequate for assessing PA pressure.  3. Left atrial size was mildly dilated.  4. The mitral valve is normal in structure. Trivial mitral valve regurgitation. No evidence of mitral stenosis.  5. The aortic valve is normal in structure. Aortic valve regurgitation is not visualized. Mild aortic valve stenosis. Aortic valve area, by VTI measures 1.69 cm. Aortic valve mean gradient measures 8.0 mmHg. Aortic valve Vmax measures 1.92 m/s.  6. The inferior vena cava is normal in size with greater than 50% respiratory variability, suggesting right atrial pressure of 3 mmHg. FINDINGS  Left Ventricle: Left ventricular ejection fraction, by estimation, is 50 to 55%. The left ventricle has low normal function. The left ventricle has no regional wall motion abnormalities. The left ventricular internal cavity size was normal in size. There is no left ventricular hypertrophy. Left ventricular diastolic parameters are consistent with Grade I diastolic  dysfunction (impaired relaxation). Right Ventricle: The right ventricular size is normal. No increase in right ventricular wall thickness. Right ventricular systolic function is normal. Tricuspid regurgitation signal is inadequate for assessing PA pressure. Left Atrium: Left atrial size was mildly dilated. Right Atrium: Right atrial size was normal in size. Pericardium: There is no evidence of pericardial effusion. Presence of epicardial fat layer. Mitral Valve: The mitral valve is normal in structure. Trivial mitral valve regurgitation. No evidence of mitral valve stenosis. Tricuspid Valve: The tricuspid valve is normal in structure. Tricuspid valve regurgitation is not demonstrated. No evidence of tricuspid stenosis. Aortic Valve: The aortic valve is normal in structure. Aortic valve regurgitation is not visualized. Mild aortic stenosis is present. Aortic valve mean gradient measures 8.0 mmHg. Aortic valve peak gradient measures 14.7 mmHg. Aortic valve area, by VTI measures 1.69 cm. Pulmonic Valve: The pulmonic valve was normal in structure. Pulmonic valve regurgitation is not visualized. No evidence of pulmonic stenosis. Aorta: The aortic root is normal in size and structure. Venous: The inferior vena cava is normal in size with greater than 50% respiratory variability, suggesting right atrial pressure of 3 mmHg. IAS/Shunts: No atrial level shunt detected by color flow Doppler.  LEFT VENTRICLE PLAX 2D LVIDd:         5.70 cm   Diastology LVIDs:         4.10 cm   LV  e' medial:    6.42 cm/s LV PW:         1.20 cm   LV E/e' medial:  19.0 LV IVS:        0.90 cm   LV e' lateral:   7.07 cm/s LVOT diam:     2.20 cm   LV E/e' lateral: 17.3 LV SV:         76 LV SV Index:   38 LVOT Area:     3.80 cm  RIGHT VENTRICLE RV S prime:     13.40 cm/s TAPSE (M-mode): 2.4 cm LEFT ATRIUM             Index        RIGHT ATRIUM           Index LA diam:        4.40 cm 2.18 cm/m   RA Area:     18.30 cm LA Vol (A2C):   72.9 ml 36.03  ml/m  RA Volume:   50.20 ml  24.83 ml/m LA Vol (A4C):   63.6 ml 31.45 ml/m LA Biplane Vol: 69.9 ml 34.57 ml/m  AORTIC VALVE AV Area (Vmax):    1.69 cm AV Area (Vmean):   1.89 cm AV Area (VTI):     1.69 cm AV Vmax:           192.00 cm/s AV Vmean:          133.000 cm/s AV VTI:            0.452 m AV Peak Grad:      14.7 mmHg AV Mean Grad:      8.0 mmHg LVOT Vmax:         85.40 cm/s LVOT Vmean:        66.000 cm/s LVOT VTI:          0.201 m LVOT/AV VTI ratio: 0.44  AORTA Ao Root diam: 3.10 cm Ao Asc diam:  3.50 cm MITRAL VALVE MV Area (PHT): 3.61 cm     SHUNTS MV Decel Time: 210 msec     Systemic VTI:  0.20 m MV E velocity: 122.00 cm/s  Systemic Diam: 2.20 cm MV A velocity: 95.40 cm/s MV E/A ratio:  1.28 Kardie Tobb DO Electronically signed by Berniece Salines DO Signature Date/Time: 11/16/2021/4:05:41 PM    Final    MR BRAIN WO CONTRAST  Result Date: 11/16/2021 CLINICAL DATA:  Seizure EXAM: MRI HEAD WITHOUT CONTRAST TECHNIQUE: Multiplanar, multiecho pulse sequences of the brain and surrounding structures were obtained without intravenous contrast. COMPARISON:  No prior MRI, correlation is made with CT head 11/14/2021 FINDINGS: Brain: No restricted diffusion to suggest acute or subacute infarct. Redemonstrated subdural collection along the right cerebral convexity, which measures up to 10 mm, similar to the prior CT. Fluid-fluid levels within the collection, with loculations, consistent with acute on chronic subdural hematoma. This causes some sulcal effacement along the right cerebral hemisphere, with 2 mm right-to-left midline shift. Mild mass effect on the right lateral ventricle. No hydrocephalus. Additional smaller collections along the left cerebral convexity, primarily about the left temporal and occipital lobe, measuring up to 2 mm (series 11, image 11), along the right tentorial leaflet, measuring 2 mm (series 25, image 7), along the right inferior temporal lobe (series 25, image 28), measuring  approximately 1 mm, and along the medial aspect of the right temporal lobe, measuring 5 mm (series 25, image 33). No acute parenchymal hemorrhage or mass. Scattered T2 hyperintense signal in the periventricular  white matter, likely the sequela of mild chronic small vessel ischemic disease. Evaluation is somewhat limited by motion artifact. Within this limitation, the hippocampi are symmetric in size and signal. No heterotopia or evidence of cortical dysgenesis. Vascular: Normal flow voids. Skull and upper cervical spine: Normal marrow signal. Sinuses/Orbits: No acute finding. Other: The mastoids are well aerated. IMPRESSION: 1. Redemonstrated acute on chronic subdural hematoma along the right cerebral convexity, measuring up to 10 mm, with some sulcal effacement and 2 mm right-to-left midline shift. 2. Additional smaller subdural collections along the left cerebral convexity, along the right tentorial leaflet, and along the right inferior temporal lobe, measuring up to 5 mm. 3. No additional acute intracranial process. No seizure etiology identified. Electronically Signed   By: Merilyn Baba M.D.   On: 11/16/2021 02:58   Overnight EEG with video  Result Date: 11/15/2021 [name], MD     11/15/2021  9:35 AM Patient Name: OLAND ARQUETTE MRN: [id number] Epilepsy Attending: [name] Referring Physician/Provider: Rikki Spearing, NP Duration: 11/14/2021 1034 to 11/15/2021 0758  Patient history: 61yo M with new onset seizures. EEG to evaluate for seizure.  Level of alertness: Awake, asleep  AEDs during EEG study: LEV  Technical aspects: This EEG study was done with scalp electrodes positioned according to the 10-20 International system of electrode placement. Electrical activity was reviewed with band pass filter of 1-'70Hz'$ , sensitivity of 7 uV/mm, display speed of 67m/sec with a '60Hz'$  notched filter applied as appropriate. EEG data were recorded continuously and digitally stored.  Video monitoring  was available and reviewed as appropriate.  Description: At the beginning of the study, EEG showed continuous generalized and lateralized right hemisphere 3-6 theta-delta slowing.  Gradually, EEG improved and showed  posterior dominant rhythm of 8-'9Hz'$  activity of moderate voltage (25-35 uV) seen predominantly in posterior head regions, symmetric and reactive to eye opening and eye closing. Sleep was characterized by vertex waves, sleep spindles (12 to 14 Hz), maximal frontocentral region.  EEG also showed intermittent generalized and lateralized right hemisphere 3 to 6 Hz theta-delta slowing. Hyperventilation and photic stimulation were not performed.    ABNORMALITY - Continuous slow, generalized and lateralized right hemisphere  IMPRESSION: This study was initially suggestive of cortical dysfunction arising from right hemisphere likely secondary to postictal state as well as moderate to severe diffuse encephalopathy.  Gradually, EEG improved and was suggestive of mild to moderate diffuse encephalopathy, nonspecific etiology.  No seizures or epileptiform discharges were seen throughout the recording.  Please note lack of epileptiform activity does not exclude the diagnosis of epilepsy.   PLora Havens   DG CHEST PORT 1 VIEW  Result Date: 11/15/2021 CLINICAL DATA:  Sepsis. EXAM: PORTABLE CHEST 1 VIEW COMPARISON:  08/01/2021. FINDINGS: The heart is enlarged and the mediastinal contour is stable. The pulmonary vasculature is distended. Interstitial prominence is noted bilaterally. No consolidation, effusion, or pneumothorax. Cervical spinal fusion hardware is noted. No acute osseous abnormality. IMPRESSION: 1. Cardiomegaly with pulmonary vascular congestion. 2. Interstitial prominence bilaterally, possible edema or infiltrate. Electronically Signed   By: LBrett FairyM.D.   On: 11/15/2021 01:45   CT ANGIO HEAD NECK W WO CM W PERF (CODE STROKE)  Result Date: 11/14/2021 CLINICAL DATA:  61year old male  code stroke presentation this morning. Recent right side subdural hematoma and seizure like activity. EXAM: CT ANGIOGRAPHY HEAD AND NECK CT PERFUSION BRAIN TECHNIQUE: Multidetector CT imaging of the head and neck was performed using the standard protocol during  bolus administration of intravenous contrast. Multiplanar CT image reconstructions and MIPs were obtained to evaluate the vascular anatomy. Carotid stenosis measurements (when applicable) are obtained utilizing NASCET criteria, using the distal internal carotid diameter as the denominator. Multiphase CT imaging of the brain was performed following IV bolus contrast injection. Subsequent parametric perfusion maps were calculated using RAPID software. RADIATION DOSE REDUCTION: This exam was performed according to the departmental dose-optimization program which includes automated exposure control, adjustment of the mA and/or kV according to patient size and/or use of iterative reconstruction technique. CONTRAST:  138m OMNIPAQUE IOHEXOL 350 MG/ML SOLN COMPARISON:  Head CT, 0851 hours today and earlier. Chest CT 08/01/2021 FINDINGS: CT Brain Perfusion Findings: CBF (<30%) Volume: 5 mL, but appears to be extra-axial artifact associated with the known SDH. Perfusion (Tmax>6.0s) volume: 013m no T-max parameter abnormality Mismatch Volume: Not applicable Infarction Location: Not applicable. CTA NECK Skeleton: Absent dentition. Previous lower cervical spine fusion. Cervical spine degeneration elsewhere. No acute osseous abnormality identified. Upper chest: Respiratory motion. Scattered mostly sub solid bilateral peribronchial nodular opacities. These are new from the July chest CT and appear inflammatory. No pleural effusion. No superior mediastinal lymphadenopathy. Other neck: Small volume retained secretions in the pharynx. Otherwise negative. Aortic arch: Calcified aortic atherosclerosis. 3 vessel arch configuration. Right carotid system: Negative brachiocephalic  artery. Mild motion artifact and plaque at the right CCA origin without stenosis. Intermittent plaque before the bifurcation, including combined soft and calcified plaque series 5, image 119, but no stenosis. Mostly calcified plaque at the bifurcation, right ICA origin and bulb without stenosis. Moderate additional calcified plaque of the right ICA at the skull base with up to 50 % stenosis with respect to the distal vessel (series 5, image 72). Left carotid system: Mild left CCA plaque without stenosis. Calcified plaque at the left ICA origin and bulb with up to 50 % stenosis with respect to the distal vessel. Vertebral arteries: Right subclavian artery origin motion artifact and calcified plaque with estimated 70 % stenosis with respect to the distal vessel. See series 9, image 117. But the right vertebral artery origin is normal. Right vertebral artery is patent to the skull base with no significant plaque or stenosis. Contralateral moderate proximal left subclavian artery calcified plaque with no significant stenosis. Normal left vertebral artery origin. Codominant left vertebral artery is patent to the skull base with no significant plaque or stenosis. CTA HEAD Posterior circulation: Left vertebral V4 segment is normal to the vertebrobasilar junction. Right vertebral V4 segment with tandem severe irregularity and tandem stenoses of 3-4 mm in length leading up to the vertebrobasilar junction. See series 12, images 24 and 23. Despite this the right V4 segment remains patent. Patent basilar artery with no significant stenosis. Normal SCA and PCA origins. Bilateral AICA origins are patent and may be dominant. Bilateral PCA branches are within normal limits. Anterior circulation: Both ICA siphons are patent with mild to moderate calcified plaque but only mild bilateral siphon stenosis. Patent carotid termini, MCA and ACA origins. Diminutive or absent anterior communicating artery. Bilateral ACA branches are within  normal limits. Left MCA M1 segment and bifurcation are patent without stenosis. Left MCA branches are within normal limits. Right MCA M1 segment and bifurcation are patent without stenosis. Right MCA branches remain within normal limits, mild branch mass effect related to known broad-based right side subdural hematoma Venous sinuses: Patent. Right SDH displaces the cortical veins away from the inner table of the right skull as expected. No active extravasation into the  right subdural hematoma is identified. Anatomic variants: None. Review of the MIP images confirms the above findings IMPRESSION: 1. Negative for large vessel occlusion, and negative CT Perfusion. 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 And other atherosclerosis in the neck with estimated 70% stenosis of the Right Subclavian Artery origin, up to 50% stenosis of the cervical ICAs. 2. Bilateral upper lobe multifocal peribronchial nodular opacities are new since a July chest CT, nonspecific but suspicious for Acute bronchopneumonia. 3. Known Right Side Subdural Hematoma with mild mass effect on the Right MCA branches. 4.  Aortic Atherosclerosis (ICD10-I70.0). Electronically Signed   By: Genevie Ann M.D.   On: 11/14/2021 09:34   CT HEAD CODE STROKE WO CONTRAST  Result Date: 11/14/2021 CLINICAL DATA:  Code stroke.  Right-sided gaze, slurred speech. EXAM: CT HEAD WITHOUT CONTRAST TECHNIQUE: Contiguous axial images were obtained from the base of the skull through the vertex without intravenous contrast. RADIATION DOSE REDUCTION: This exam was performed according to the departmental dose-optimization program which includes automated exposure control, adjustment of the mA and/or kV according to patient size and/or use of iterative reconstruction technique. COMPARISON:  CT head 11/12/2019 FINDINGS: Brain: Again seen is the right cerebral convexity subdural hematoma measuring up to  1.0 cm in maximal thickness in the coronal plane (5-34), not significantly changed in maximal size but with new hyperdense blood layering dependently within the hematoma suggesting interval bleed. A small amount of subdural blood is also seen layering along the right tentorial leaflet, unchanged. The hematoma exerts mass effect on the underlying brain parenchyma with sulcal effacement and partial effacement of the right lateral ventricle but no midline shift. There is no other acute intracranial hemorrhage. There is no evidence of acute territorial infarct. Gray-white differentiation is preserved. Ventricles are stable in size.  There is no solid mass lesion. Vascular: No hyperdense vessel is seen. Skull: Normal. Negative for fracture or focal lesion. Sinuses/Orbits: The paranasal sinuses are clear. The globes and orbits are unremarkable. Other: None. IMPRESSION: 1. Similar size of the right cerebral convexity subdural hematoma measuring up to 1.0 cm in maximal thickness, but with new hyperdense blood layering dependently suggesting interval bleed. Mild mass effect on the underlying brain parenchyma with no midline shift. 2. No evidence of acute territorial infarct. Findings of the initial noncontrast head CT were discussed with Dr. Quinn Axe at 8:55 a.m. Electronically Signed   By: Valetta Mole M.D.   On: 11/14/2021 09:01   CT HEAD WO CONTRAST (5MM)  Result Date: 11/11/2021 CLINICAL DATA:  Follow-up subdural hematoma EXAM: CT HEAD WITHOUT CONTRAST TECHNIQUE: Contiguous axial images were obtained from the base of the skull through the vertex without intravenous contrast. RADIATION DOSE REDUCTION: This exam was performed according to the departmental dose-optimization program which includes automated exposure control, adjustment of the mA and/or kV according to patient size and/or use of iterative reconstruction technique. COMPARISON:  Head CT from 3 days ago FINDINGS: Brain: Mixed density subdural hematoma along  the right cerebral convexity, maximal thickness along the anterior right frontal convexity measuring up to 8 mm. Local cortical mass effect is mild. High-density subdural clot along the right tentorial leaflet is new, possibly redistribution. No hydrocephalus, infarct, or masslike finding Vascular: Arterial calcifications correlating with end-stage renal disease Skull: Normal. Negative for fracture or focal lesion. Sinuses/Orbits: Negative IMPRESSION: Trace increase in subdural hemorrhage along the right tentorium, but unchanged maximal thickness along the right frontal convexity at 8  mm. Electronically Signed   By: Jorje Guild M.D.   On: 11/11/2021 11:44   EEG adult  Result Date: 11/09/2021 [name], MD     11/09/2021  3:18 PM Patient Name: PRATHAM CASSATT MRN: [id number] Epilepsy Attending: [name] Referring Physician/Provider: August Albino, NP Date:  11/09/2021 Duration: 23.38 mins Patient history: 61yo M with new onset seizures. EEG to evaluate for seizure. Level of alertness: Awake, asleep AEDs during EEG study: LEV Technical aspects: This EEG study was done with scalp electrodes positioned according to the 10-20 International system of electrode placement. Electrical activity was reviewed with band pass filter of 1-'70Hz'$ , sensitivity of 7 uV/mm, display speed of 46m/sec with a '60Hz'$  notched filter applied as appropriate. EEG data were recorded continuously and digitally stored.  Video monitoring was available and reviewed as appropriate. Description: The posterior dominant rhythm consists of 8-'9Hz'$  activity of moderate voltage (25-35 uV) seen predominantly in posterior head regions, symmetric and reactive to eye opening and eye closing. Sleep was characterized by vertex waves, sleep spindles (12 to 14 Hz), maximal frontocentral region.  EEG showed intermittent 3 to 6 Hz theta-delta slowing in right temporal region. Hyperventilation and photic stimulation were not performed.    ABNORMALITY - Intermittent slow, right temporal region IMPRESSION: This study is suggestive of cortical dysfunction arising from right temporal region likely secondary to underlying SDH. No seizures or epileptiform discharges were seen throughout the recording. Please note lack of epileptiform activity does not exclude the diagnosis of epilepsy. [name]  CT HEAD WO CONTRAST (5MM)  Result Date: 11/08/2021 CLINICAL DATA:  Follow-up subdural hematoma EXAM: CT HEAD WITHOUT CONTRAST TECHNIQUE: Contiguous axial images were obtained from the base of the skull through the vertex without intravenous contrast. RADIATION DOSE REDUCTION: This exam was performed according to the departmental dose-optimization program which includes automated exposure control, adjustment of the mA and/or kV according to patient size and/or use of iterative reconstruction technique. COMPARISON:  Yesterday FINDINGS: Brain: Mixed density subdural hematoma along the right cerebral convexity, primarily lateral but also minimally seen inferiorly. In the anterior frontal region maximal thickness is 9 mm. No progression when compared on coronal reformats. Mild right cerebral hemispheric mass effect. No infarct, hydrocephalus, or masslike finding. Vascular: No hyperdense vessel or unexpected calcification. Skull: Normal. Negative for fracture or focal lesion. Sinuses/Orbits: No acute finding. IMPRESSION: Unchanged subdural hematoma along the right cerebral convexity measuring up to 9 mm at the anterior frontal lobe. Electronically Signed   By: JJorje GuildM.D.   On: 11/08/2021 10:12   CT Head Wo Contrast  Result Date: 11/07/2021 CLINICAL DATA:  Headache; seizure-like activity EXAM: CT HEAD WITHOUT CONTRAST TECHNIQUE: Contiguous axial images were obtained from the base of the skull through the vertex without intravenous contrast. RADIATION DOSE REDUCTION: This exam was performed according to the departmental dose-optimization  program which includes automated exposure control, adjustment of the mA and/or kV according to patient size and/or use of iterative reconstruction technique. COMPARISON:  None Available. FINDINGS: Brain: Subdural hematoma overlying the right frontal convexity measuring up to 9 mm in greatest radial diameter. Adjacent mass effect on the right frontal lobe with mild loss of sulcation. 1-2 mm of leftward midline shift at the septum pellucidum. Basal cisterns are patent. Normal caliber of the ventricles. No evidence of acute infarct. Mild generalized cerebral atrophy. Mild chronic microvascular ischemic disease. Vascular: No hyperdense vessel or unexpected calcification. Skull: Normal. Negative for fracture or focal lesion. Sinuses/Orbits: No acute  finding. Other: None. IMPRESSION: Acute subdural hematoma overlying the right frontal convexity measuring 9 mm in greatest radial diameter. Mild adjacent mass effect and 1-2 mm of leftward midline shift at the septum pellucidum. These results were called by telephone at the time of interpretation on 11/07/2021 at 10:00 pm to provider [facility] , who verbally acknowledged these results. Electronically Signed   By: Placido Sou M.D.   On: 11/07/2021 22:02    DISCHARGE EXAMINATION: Vitals:   11/21/21 0315 11/21/21 0330 11/21/21 0551 11/21/21 0730  BP: (!) 126/96  (!) 126/98 (!) 177/76  Pulse: 69   66  Resp:  18  17  Temp: 98 F (36.7 C)   98.3 F (36.8 C)  TempSrc: Oral   Oral  SpO2: 97%   100%  Weight:      Height:       General appearance: Awake alert.  In no distress Resp: Clear to auscultation bilaterally.  Normal effort Cardio: S1-S2 is normal regular.  No S3-S4.  No rubs murmurs or bruit GI: Abdomen is soft.  Nontender nondistended.  Bowel sounds are present normal.  No masses organomegaly   DISPOSITION: CIR   Current Inpatient Medications: Scheduled:  amLODipine  10 mg Oral QPM   calcitRIOL  1 mcg Oral Q M,W,F-HD   Chlorhexidine  Gluconate Cloth  6 each Topical Q0600   cholecalciferol  4,000 Units Oral Daily   doxazosin  4 mg Oral Daily   guaiFENesin  1,200 mg Oral BID   hydrALAZINE  100 mg Oral Q8H   isosorbide mononitrate  60 mg Oral Daily   [START ON 11/22/2021] levETIRAcetam  250 mg Oral Q M,W,F-1800   [START ON 11/22/2021] levETIRAcetam  500 mg Oral Daily   losartan  100 mg Oral QPM   methocarbamol  750 mg Oral QID   metoprolol tartrate  100 mg Oral BID   multivitamin  1 tablet Oral Daily   pantoprazole  40 mg Oral BID AC   pravastatin  40 mg Oral Daily   pregabalin  50 mg Oral Daily   senna-docusate  1 tablet Oral BID   sevelamer carbonate  3,200 mg Oral TID WC   sucralfate  1 g Oral TID WC & HS   traZODone  50 mg Oral QHS     TMH:[id number] **OR** acetaminophen, bisacodyl, cloNIDine, HYDROmorphone (DILAUDID) injection, labetalol, metoCLOPramide (REGLAN) injection, nitroGLYCERIN, ondansetron **OR** ondansetron (ZOFRAN) IV, mouth rinse, oxyCODONE, polyethylene glycol, sevelamer carbonate     Follow-up Information     [facility]. Schedule an appointment as soon as possible for a visit in 1 week(s).   Specialty: Rehabilitation Contact information: [address] [place] [id number] [place] [address] [phone]                TOTAL DISCHARGE TIME: 58 mins  [place]  [facility] Pager on [email or web address]  11/21/2021, 10:36 AM

## 2021-11-21 NOTE — Progress Notes (Signed)
Inpatient Rehabilitation Admission Medication Review by a Pharmacist  A complete drug regimen review was completed for this patient to identify any potential clinically significant medication issues.  High Risk Drug Classes Is patient taking? Indication by Medication  Antipsychotic No   Anticoagulant No   Antibiotic No   Opioid Yes Oxycodone - pain  Antiplatelet No   Hypoglycemics/insulin No   Vasoactive Medication Yes Amlodipine/doxazosin/clonidine/Imdur/losartan/lopressor/hydralazine - HTN  Chemotherapy No   Other Yes Calcitriol - Secondary Hyperparathyroidism  Guaifenesin - congestion Keppra - Sz NTG - CP Protonix - GERD Pravastatin - HLD Lyrica - neuropathy Robaxin - spasms Renvela - Secondary Hyperparathyroidism  Sucrafate - GERD     Type of Medication Issue Identified Description of Issue Recommendation(s)  Drug Interaction(s) (clinically significant)     Duplicate Therapy     Allergy     No Medication Administration End Date     Incorrect Dose     Additional Drug Therapy Needed     Significant med changes from prior encounter (inform family/care partners about these prior to discharge).    Other       Clinically significant medication issues were identified that warrant physician communication and completion of prescribed/recommended actions by midnight of the next day:  No  Name of provider notified for urgent issues identified:   Provider Method of Notification:     Pharmacist comments:   Time spent performing this drug regimen review (minutes):  [place], PharmD, [place], AAHIVP, CPP Infectious Disease Pharmacist 11/21/2021 5:16 PM

## 2021-11-21 NOTE — Progress Notes (Signed)
Physical Therapy Treatment Patient Details Name: Kirk [name] MRN: [id number] DOB: September 17, 1960 Today's Date: 11/21/2021   History of Present Illness Pt is a 61 y.o. male who presented to the ED 10/17 with seizure-like activity. CT revealed R frontal SDH. Near-syncopal-type episode during therapy 10/21, orthostatics stable. 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 Pt needing up to mod a during gait to prevent falls secondary to pt stated dizziness especially during turning, and noted intermittent difficulty controlling LE movement during gait. Pt continues to fatigue quickly needing seated rest between all exercises and seated recovery between gait bouts. Current plan remains appropriate to address deficits and maximize functional independence and decrease caregiver burden. Pt continues to benefit from skilled PT services to progress toward functional mobility goals.    Recommendations for follow up therapy are one component of a multi-disciplinary discharge planning process, led by the attending physician.  Recommendations may be updated based on patient status, additional functional criteria and insurance authorization.  Follow Up Recommendations  Acute inpatient rehab (3hours/day)     Assistance Recommended at Discharge Frequent or constant Supervision/Assistance  Patient can return home with the following A lot of help with walking and/or  transfers;A little help with bathing/dressing/bathroom;Help with stairs or ramp for entrance;Assist for transportation   Equipment Recommendations  Other (comment) (TBA)    Recommendations for Other Services Rehab consult     Precautions / Restrictions Precautions Precautions: Fall Precaution Comments: watch vitals Restrictions Weight Bearing Restrictions: No     Mobility  Bed Mobility Overal bed mobility: Needs Assistance             General bed mobility comments: pt seated EOB pre and post session    Transfers Overall transfer level: Needs assistance Equipment used: Rolling walker (2 wheels), None, Rollator (4 wheels) Transfers: Sit to/from Stand Sit to Stand: Min guard           General transfer comment: Min guard A for safety from EOB and rollator seat    Ambulation/Gait Ambulation/Gait assistance: Min assist, Mod assist Gait Distance (Feet): 45 Feet (x2) Assistive device: Rolling walker (2 wheels), Rollator (4 wheels) Gait Pattern/deviations: Step-through pattern, Decreased stride length, Staggering right, Staggering left Gait velocity: decreased     General Gait Details: min guard with intermittent mod assit to maintain balance as pt with lateral sway and knee buckling, pt with noted difficulty controlling LE intermittently with jerking steps and catching Bfeet, pt with good insight into deficits stating need for increased awareness going forward   Stairs             Wheelchair Mobility    Modified Rankin (Stroke Patients Only)       Balance Overall balance assessment: Needs assistance Sitting-balance support: Feet supported Sitting balance-Leahy Scale: Fair Sitting balance - Comments: able to don shoes in figure four pattern without assist   Standing balance support: Bilateral upper extremity supported Standing balance-Leahy Scale: Poor Standing balance comment: poor  dynamic balance. able to static stand without UE support                             Cognition Arousal/Alertness: Awake/alert Behavior During Therapy: Flat affect Overall Cognitive Status: Within Functional Limits for tasks assessed Area of Impairment: Following commands, Safety/judgement, Problem solving, Attention                   Current Attention Level: Selective   Following Commands: Follows one step commands consistently, Follows one step commands with increased time Safety/Judgement: Decreased awareness of safety Awareness: Intellectual, Emergent Problem Solving: Slow processing General Comments: Pt following one step commands with increased time. conversational and pleasant this session/agreeable to mobility. Mod cues to follow all multistep commands with increased cues throughout for safety awareness        Exercises Other Exercises Other Exercises: step ups forward x10 ea side, laterally x10 each side with BUE support on rails. cues for upright posture, pt able to count reps outloud with accuracy    General Comments        Pertinent Vitals/Pain Pain Assessment Pain Assessment: Faces Faces Pain Scale: Hurts little more Pain Location: headache Pain Descriptors / Indicators: Headache Pain Intervention(s): Monitored during session    Home Living                          Prior Function            PT Goals (current goals can now be found in the care plan section) Acute Rehab PT Goals Patient Stated Goal: to feel better and less HA prior to going home PT Goal Formulation: With patient Time For Goal Achievement: 11/24/21    Frequency    Min 4X/week      PT Plan      Co-evaluation              AM-PAC PT "6 Clicks" Mobility   Outcome Measure  Help needed turning from your back to your side while in a flat bed without using bedrails?: A Little Help needed moving from lying on your back to sitting on the side of a flat bed without using bedrails?: A Little Help needed moving to and from  a bed to a chair (including a wheelchair)?: A Little Help needed standing up from a chair using your arms (e.g., wheelchair or bedside chair)?: A Little Help needed to walk in hospital room?: A Lot Help needed climbing 3-5 steps with a railing? : Total 6 Click Score: 15    End of Session Equipment Utilized During Treatment: Gait belt Activity Tolerance: Patient tolerated treatment well Patient left: with call bell/phone within reach;in bed;with bed alarm set (seated EOB) Nurse Communication: Mobility status PT Visit Diagnosis: Muscle weakness (generalized) (M62.81);Other abnormalities of gait and mobility (R26.89) Pain - part of body:  (head)     Time: 1610-9604 PT Time Calculation (min) (ACUTE ONLY): 21 min  Charges:  $Therapeutic Exercise: 8-22 mins                     Basilia Stuckert R. PTA Acute Rehabilitation Services Office: [place] 11/21/2021, 10:21 AM

## 2021-11-21 NOTE — Progress Notes (Signed)
Contacted [facility] to advise clinic that pt will transfer to [facility] rehab today.   Melven Sartorius Renal Navigator [phone]

## 2021-11-21 NOTE — Progress Notes (Signed)
Nephrology Progress Note  Subjective:  seen in room. 4 L off w/ HD yesterday. No SOB today   Objective Vital signs in last 24 hours: Vitals:   11/21/21 0330 11/21/21 0551 11/21/21 0730 11/21/21 1100  BP:  (!) 126/98 (!) 177/76 (!) 159/64  Pulse:   66 65  Resp: '18  17 14  '$ Temp:   98.3 F (36.8 C) 98 F (36.7 C)  TempSrc:   Oral Oral  SpO2:   100% 100%  Weight:      Height:       Weight change:   Intake/Output Summary (Last 24 hours) at 11/21/2021 1317 Last data filed at 11/21/2021 0825 Gross per 24 hour  Intake 1060 ml  Output 4000 ml  Net -2940 ml    Physical Exam:      General adult male in bed in NAD Lungs clear to auscultation bilaterally  Heart S1S2 no rub Abdomen soft nontender nondistended Extremities no edema  Neuro - Interactive and conversant; follows commands Access LUE AVF bruit and thrill    OP HD:  [facility] M[facility]  4h 31mn 450/1.5   89.9kg  2/2 bath  P4 LUA AVF  Hep NONE (was on hep 2500 prior to admit) - sensipar 60 mg qHD - calcitriol 1.25 mcg PO qHD   Assessment/Plan: New onset seizure - due to 2/2 Right SDH per neurology. Neurology following. Now back on keppra and off depakote. Seizure/fall precautions.   Subdural hematoma - Repeat head CT with concern for interval bleed. Neurosurgery assessed on 10/26 and again recommended conservative management with outpatient follow-up with them in clinic ESRD -  On dialysis MWF. HD tomorrow. NO heparin (due to IC bleed).  HTN  - per primary team; setting of intracranial bleed.  Optimize volume with HD.  In the past has missed meds due to HD and seizure/ams and has not always received PRN's.  Moved amlodipine and losartan to evening dosing to avoid holding it for HD. Continue hydralazine 25 mg TID (new med). PRN's ordered. Volume - is 4kg up by wts, looks better volume today w/ 4L off yest on HD. Cont large UF w/ next HD.  Anemia of CKD - previously without indication for ESA now with subdural hematoma and uncontrolled  HTN. Resume ESA once BP under control  Secondary Hyperparathyroidism - on calcitriol. Sevelamer with meals Fever - abx per primary team. Fever resolved   OSA on CPAP - CPAP at night Chronic thrombocytopenia  - stable Afib with RVR - rapid afib s/p dilt on 10/25. Now off.  Per primary team. On beta blocker   RKelly Splinter MD 11/21/2021, 1:17 PM  Recent Labs  Lab 11/17/21 1517 11/19/21 0326 11/21/21 0321  HGB  --  8.3* 8.3*  ALBUMIN 2.9* 3.1*  --   CALCIUM 8.8* 9.1  --   PHOS 3.6 5.7*  --   CREATININE 5.60* 10.51*  --   K 3.2* 3.9  --      Inpatient medications:  amLODipine  10 mg Oral QPM   calcitRIOL  1 mcg Oral Q M,W,F-HD   Chlorhexidine Gluconate Cloth  6 each Topical Q0600   cholecalciferol  4,000 Units Oral Daily   doxazosin  4 mg Oral Daily   guaiFENesin  1,200 mg Oral BID   hydrALAZINE  100 mg Oral Q8H   isosorbide mononitrate  60 mg Oral Daily   [START ON 11/22/2021] levETIRAcetam  250 mg Oral Q M,W,F-1800   [START ON 11/22/2021] levETIRAcetam  500 mg Oral  Daily   losartan  100 mg Oral QPM   methocarbamol  750 mg Oral QID   metoprolol tartrate  100 mg Oral BID   multivitamin  1 tablet Oral Daily   pantoprazole  40 mg Oral BID AC   pravastatin  40 mg Oral Daily   pregabalin  50 mg Oral Daily   senna-docusate  1 tablet Oral BID   sevelamer carbonate  3,200 mg Oral TID WC   sucralfate  1 g Oral TID WC & HS   traZODone  50 mg Oral QHS     acetaminophen **OR** acetaminophen, bisacodyl, cloNIDine, HYDROmorphone (DILAUDID) injection, labetalol, metoCLOPramide (REGLAN) injection, nitroGLYCERIN, ondansetron **OR** ondansetron (ZOFRAN) IV, mouth rinse, oxyCODONE, polyethylene glycol, sevelamer carbonate

## 2021-11-21 NOTE — TOC Transition Note (Signed)
Transition of Care [facility]) - CM/SW Discharge Note   Patient Details  Name: Kirk [name] MRN: [id number] Date of Birth: 08/13/1960  [facility] CM/SW Contact:  Pollie Friar, RN Phone Number: 11/21/2021, 1:17 PM   Clinical Narrative:    Pt is discharging to CIR today. Bed transport to the unit.  No further needs per TOC.   Final next level of care: IP Rehab Facility Barriers to Discharge: No Barriers Identified   Patient Goals and CMS Choice     Choice offered to / list presented to : Patient, Spouse  Discharge Placement                       Discharge Plan and Services   Discharge Planning Services: CM Consult            DME Arranged: Bedside commode DME Agency: [employer] Date DME Agency Contacted: 11/10/21   Representative spoke with at DME Agency: Annie Sable            Social Determinants of Health ([place]) Interventions     Readmission Risk Interventions     No data to display

## 2021-11-21 NOTE — Progress Notes (Signed)
PMR Admission Coordinator Pre-Admission Assessment   Patient: Kirk [name] is an 61 y.o., male MRN: [id number] DOB: 1960-12-19 Height: _0  (170.2 cm) Weight: 91.6 kg   Insurance Information HMO:     PPO:      PCP:      IPA:      80/20:      OTHER:  PRIMARY: Medicare A & B      Policy#: [id number]      Subscriber: patient Benefits:  Phone #: Onesourse-online verified    Eff. Date: 04/23/2010     Deduct: $1600      Out of Pocket Max: $0       CIR: 100%      SNF: 20 full days Outpatient: 80% coverage, 20% copay      Home Health: 100%       DME: 80% coverage, 20% copay       SECONDARY: BCBS      Policy#: [id number]     Phone#: [phone]       The "Data Collection Information Summary" for patients in Inpatient Rehabilitation Facilities with attached "Privacy Act [place] Records" was provided and verbally reviewed with: Patient and Family   Emergency Contact Information Contact Information       Name Relation Home Work Mobile    Kirk [name] Spouse [phone]   [phone]           Current Medical History  Patient Admitting Diagnosis: SDH, seizure History of Present Illness:  Kirk [name] is a 61 year old right-handed male with history of 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  Per chart review patient lives with spouse and family.  1 level home 3 steps to entry.  Reportedly independent with history of frequent falls and was driving short distances.  Wife can provide assistance as needed.  Presented 11/07/2021 after reported seizure found by his wife lasting approximately 2 minutes.  There  has been reports of headache for the past few days.  Admission chemistries unremarkable except glucose 149 BUN 40 creatinine 11.66, hemoglobin 10.1.  EEG no seizure or epileptiform discharge were seen.  Echocardiogram with ejection fraction of 50 to 55% no wall motion abnormalities grade 1 diastolic dysfunction.  Cranial CT scan showed acute subdural hematoma overlying the right frontal convexity measuring 9 mm in greatest radial diameter.  Mild adjacent mass effect and 1-2 mm of leftward midline shift at the septum Pellucidum.  Follow-up neurosurgery Dr. 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  Additional smaller subdural collections along the left cerebral convexity, along the right tentorial leaflet, and along the right inferior temporal lobe measuring up to 5 mm.  Hospital course follow-up nephrology services hemodialysis ongoing as indicated.  Maintained on Keppra for seizure prophylaxis.  Patient did spike a low-grade fever 100.9 10/24 concern for SIRS placed on vancomycin cefepime and Flagyl.  Lactic acid level was normal procalcitonin 0.58 WBC normal chest x-ray suggestive of interstitial edema rather than infectious infiltrate.  2 out of 4 blood cultures from 10/25 positive for staph epidermidis  likely contaminant he did complete course of antibiotic therapy no further fever noted.  On the afternoon of 11/15/2021 return from dialysis noted to be tachycardic EKG atrial fibrillation with RVR and received a dose of Cardizem converting back to sinus rhythm.  It was felt atrial fibrillation likely provoked due to inability to take medications prior to dialysis.  He was not a candidate for anticoagulation due to hematoma.  Patient is tolerating a regular consistency diet.  Therapy  evaluations completed due to patient decreased functional mobility was admitted for a comprehensive rehab program. Complete NIHSS TOTAL: 5   Patient's medical record from [facility] has been reviewed by the rehabilitation admission coordinator and physician.   Past Medical History      Past Medical History:  Diagnosis Date   Acute edema of lung, unspecified     Acute, but ill-defined, cerebrovascular disease     Allergy     Anemia     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)     Anxiety     Asthma     Asthma      moderate persistent   Carpal tunnel syndrome     Cellulitis and abscess of trunk     Cholelithiasis 07/13/2014   Chronic headaches     Debility, unspecified     Dermatophytosis of the body     Dysrhythmia      history of   Edema     End stage renal disease on dialysis [facility])      "MWF; [facility]" (10/21/2014)   Essential hypertension, benign     GERD (gastroesophageal reflux disease)     Gout, unspecified     HTN (hypertension)     Hypertrophy of prostate without urinary obstruction and other lower urinary tract symptoms (LUTS)     Hypotension, unspecified     Impotence of organic origin     Insomnia, unspecified     Kidney replaced by transplant     Localization-related (focal) (partial) epilepsy and epileptic syndromes with complex partial seizures, without mention of intractable epilepsy      12-15-19- Wife states he has NEVER had a seizure    Lumbago     Memory loss     OSA on CPAP     Other and unspecified hyperlipidemia      controlled /managed per wife    Other chronic nonalcoholic liver disease     Other malaise and fatigue     Other nonspecific abnormal serum enzyme levels     Pain in joint, lower leg     Pain in joint, upper arm     Pneumonia "several times"   Renal dialysis status(V45.11) 02/05/2010    restarted 01/02/13 ofter renal trransplant failure   Secondary hyperparathyroidism (of renal origin)     Shortness of breath      Sleep apnea      wears cpap    Tension headache     Unspecified constipation     Unspecified essential hypertension     Unspecified hereditary and idiopathic peripheral neuropathy     Unspecified vitamin D deficiency        Has the patient had major surgery during 100 days prior to admission? No   Family History   family history is not on file. He was adopted.   Current Medications   Current Facility-Administered Medications:    acetaminophen (TYLENOL) tablet 650 mg, 650 mg, Oral, Q6H PRN, 650 mg at 11/13/21 2050 **OR** acetaminophen (TYLENOL) suppository  650 mg, 650 mg, Rectal, Q6H PRN, Etta Quill, DO, 650 mg at 11/15/21 0206   amLODipine (NORVASC) tablet 10 mg, 10 mg, Oral, QPM, Claudia Desanctis, MD, 10 mg at 11/19/21 1600   bisacodyl (DULCOLAX) suppository 10 mg, 10 mg, Rectal, Daily PRN, Aline August, MD   calcitRIOL (ROCALTROL) capsule 1 mcg, 1 mcg, Oral, Q M,W,F-HD, Alcario Drought, Jared M, DO, 1 mcg at 11/08/21 2026   Chlorhexidine Gluconate Cloth 2 % PADS 6 each, 6 each, Topical, Q0600, Claudia Desanctis, MD   cholecalciferol (VITAMIN D3) 25 MCG (1000 UNIT) tablet 4,000 Units, 4,000 Units, Oral, Daily, Alcario Drought, Jared M, DO, 4,000 Units at 11/20/21 0830   cloNIDine (CATAPRES) tablet 0.2 mg, 0.2 mg, Oral, Q4H PRN, Kristopher Oppenheim, DO, 0.2 mg at 11/19/21 0617   doxazosin (CARDURA) tablet 4 mg, 4 mg, Oral, Daily, Alcario Drought, Jared M, DO, 4 mg at 11/20/21 0831   guaiFENesin (MUCINEX) 12 hr tablet 1,200 mg, 1,200 mg, Oral, BID, Alcario Drought, Jared M, DO, 1,200 mg at 11/20/21 0831   hydrALAZINE (APRESOLINE) tablet 50 mg, 50 mg, Oral, Q8H, Bonnielee Haff, MD, 50 mg at 11/20/21 0544   HYDROmorphone (DILAUDID) injection 0.5 mg, 0.5 mg, Intravenous, Q4H PRN, Starla Link, Kshitiz, MD, 0.5 mg at 11/16/21 2029   isosorbide mononitrate (IMDUR) 24 hr tablet 30 mg, 30 mg, Oral, Daily, Alcario Drought, Jared M, DO, 30 mg at 11/20/21 7628   labetalol (NORMODYNE) injection 20 mg, 20 mg, Intravenous, Q2H PRN, Bonnielee Haff,  MD, 20 mg at 11/19/21 1600   levETIRAcetam (KEPPRA) 250 mg in sodium chloride 0.9 % 100 mL IVPB, 250 mg, Intravenous, Q M,W,F-2000, Derek Jack, MD, Last Rate: 410 mL/hr at 11/17/21 2032, 250 mg at 11/17/21 2032   [COMPLETED] levETIRAcetam (KEPPRA) IVPB 1500 mg/ 100 mL premix, 1,500 mg, Intravenous, Once, Stopped at 11/14/21 1552 **FOLLOWED BY** levETIRAcetam (KEPPRA) IVPB 500 mg/100 mL premix, 500 mg, Intravenous, Q24H, Derek Jack, MD, Last Rate: 400 mL/hr at 11/20/21 0842, 500 mg at 11/20/21 0842   losartan (COZAAR) tablet 100 mg, 100 mg, Oral, QPM, Claudia Desanctis, MD, 100 mg at 11/19/21 1607   methocarbamol (ROBAXIN) tablet 750 mg, 750 mg, Oral, QID, Alcario Drought, Jared M, DO, 750 mg at 11/20/21 0830   metoCLOPramide (REGLAN) injection 10 mg, 10 mg, Intravenous, Q6H PRN, Starla Link, Kshitiz, MD, 10 mg at 11/18/21 2019   metoprolol tartrate (LOPRESSOR) tablet 100 mg, 100 mg, Oral, BID, Alcario Drought, Jared M, DO, 100 mg at 11/20/21 0830   multivitamin (RENA-VIT) tablet 1 tablet, 1 tablet, Oral, Daily, Alcario Drought, Jared M, DO, 1 tablet at 11/19/21 2120   nitroGLYCERIN (NITROSTAT) SL tablet 0.4 mg, 0.4 mg, Sublingual, Q5 min PRN, Starla Link, Kshitiz, MD   ondansetron (ZOFRAN) tablet 4 mg, 4 mg, Oral, Q6H PRN, 4 mg at 11/20/21 0831 **OR** ondansetron (ZOFRAN) injection 4 mg, 4 mg, Intravenous, Q6H PRN, Etta Quill, DO, 4 mg at 11/17/21 3151   Oral care mouth rinse, 15 mL, Mouth Rinse, PRN, Starla Link, Kshitiz, MD   oxyCODONE (Oxy IR/ROXICODONE) immediate release tablet 5-10 mg, 5-10 mg, Oral, Q4H PRN, Starla Link, Kshitiz, MD, 5 mg at 11/20/21 0829   pantoprazole (PROTONIX) EC tablet 40 mg, 40 mg, Oral, BID AC, Gardner, Jared M, DO, 40 mg at 11/20/21 0830   polyethylene glycol (MIRALAX / GLYCOLAX) packet 17 g, 17 g, Oral, Daily PRN, Starla Link, Kshitiz, MD   pravastatin (PRAVACHOL) tablet 40 mg, 40 mg, Oral, Daily, Alcario Drought, Jared M, DO, 40 mg at 11/20/21 0831   pregabalin (LYRICA) capsule  25 mg, 25 mg, Oral, Once, Bonnielee Haff, MD   [START ON 11/21/2021] pregabalin (LYRICA) capsule 50 mg, 50 mg, Oral, Daily, Bonnielee Haff, MD   senna-docusate (Senokot-S) tablet 1 tablet, 1 tablet, Oral, BID, Starla Link, Kshitiz, MD, 1 tablet at 11/20/21 0831   sevelamer carbonate (RENVELA) tablet 1,600 mg, 1,600 mg, Oral, PRN, Etta Quill, DO   sevelamer carbonate (RENVELA) tablet 3,200 mg, 3,200 mg, Oral, TID WC, Alcario Drought, Jared M, DO, 3,200 mg at 11/20/21 7262   sucralfate (CARAFATE) 1 GM/10ML suspension 1 g, 1 g, Oral, TID WC & HS, Bonnielee Haff, MD   traZODone (DESYREL) tablet 50 mg, 50 mg, Oral, QHS, Bonnielee Haff, MD   Patients Current Diet:  Diet Order                  DIET DYS 3 Room service appropriate? No; Fluid consistency: Thin  Diet effective now                         Precautions / Restrictions Precautions Precautions: Fall Precaution Comments: watch vitals Restrictions Weight Bearing Restrictions: No    Has the patient had 2 or more falls or a fall with injury in the past year? Yes   Prior Activity Level Community (5-7x/wk): was mostly using cane prior to admission, falls   Prior Functional Level Self Care: Did the patient need help bathing, dressing, using the toilet or eating? Needed some help   Indoor Mobility: Did the patient need assistance with walking from room to room (with or without device)? Needed some help   Stairs: Did the patient need assistance with internal or external stairs (with or without device)? Needed some help   Functional Cognition: Did the patient need help planning regular tasks such as shopping or remembering to take medications? Needed some help   Patient Information Are you of Hispanic, Latino/a,or Spanish origin?: A. No, not of Hispanic, Latino/a, or Spanish origin What is your race?: N. Other Pacific Islander Do you need or want an interpreter to communicate with a doctor or health care staff? No   Patient's Response To:  Health Literacy and  Transportation Is the patient able to respond to health literacy and transportation needs?: Yes Health Literacy - How often do you need to have someone help you when you read instructions, pamphlets, or other written material from your doctor or pharmacy?: Never In the past 12 months, has lack of transportation kept you from medical appointments or from getting medications?: No In the past 12 months, has lack of transportation kept you from meetings, work, or from getting things needed for daily living?: No   Development worker, international aid / [place] Devices/Equipment: None Home Equipment: Cane - single point   Prior Device Use: Indicate devices/aids used by the patient prior to current illness, exacerbation or injury? Walker and [employer]   Current Functional Level Cognition   Overall Cognitive Status: Within Functional Limits for tasks assessed Current Attention Level: Selective Orientation Level: Oriented X4 Following Commands: Follows one step commands consistently, Follows one step commands with increased time Safety/Judgement: Decreased awareness of safety General Comments: Pt following one step commands with increased time. conversational and pleasant this session/agreeable to mobility. Mod cues to follow all multistep commands with increased cues throughout for safety awareness    Extremity Assessment (includes Sensation/Coordination)   Upper Extremity Assessment: LUE deficits/detail LUE Deficits / Details: Decreased AROM at shoulder due to "bad shoulder."  Used L arm  with spontaneous movements but not to command. LUE: Shoulder pain with ROM LUE Sensation: WNL LUE Coordination: decreased gross motor  Lower Extremity Assessment: Defer to PT evaluation     ADLs   Overall ADL's : Needs assistance/impaired Eating/Feeding: Independent, Sitting Grooming: Wash/dry face, Min guard, Standing Grooming Details (indicate cue type and reason): Min guard A for safety. Observed to use  both R and L UE to wash face and back of neck. Both UE moving functionally Upper Body Bathing: Set up, Sitting Lower Body Bathing: Min guard, Sit to/from stand Upper Body Dressing : Set up, Sitting Lower Body Dressing: Min guard, Sit to/from stand Lower Body Dressing Details (indicate cue type and reason): Min guard A for safety. Donning pants and shoes without physical assist this session. Toilet Transfer: Min guard, Ambulation, Rolling walker (2 wheels) Toilet Transfer Details (indicate cue type and reason): simulated in room Toileting- Clothing Manipulation and Hygiene: Min guard, Sit to/from stand Functional mobility during ADLs: Minimal assistance, Rolling walker (2 wheels) General ADL Comments: Min A for functional mobility in hall due to decreased safety with RW and frequent L foot dragging, especially when distracted.     Mobility   Overal bed mobility: Needs Assistance Bed Mobility: Sit to Supine, Supine to Sit Sidelying to sit: Min assist, HOB elevated Supine to sit: Min guard Sit to supine: Min guard General bed mobility comments: pt seated EOB pre and post session     Transfers   Overall transfer level: Needs assistance Equipment used: Rolling walker (2 wheels) Transfers: Sit to/from Stand Sit to Stand: Min guard Bed to/from chair/wheelchair/BSC transfer type:: Stand pivot Stand pivot transfers: Min guard Step pivot transfers: +2 safety/equipment, Mod assist General transfer comment: Min guard A for safety, cues for hand placement as pt with tendency to pull on RW     Ambulation / Gait / Stairs / Wheelchair Mobility   Ambulation/Gait Ambulation/Gait assistance: Min assist, Mod assist, Max assist Gait Distance (Feet): 100 Feet Assistive device: Rolling walker (2 wheels) Gait Pattern/deviations: Step-through pattern, Decreased stride length, Staggering right, Staggering left General Gait Details: initially walking with min a for safety, as progressed into hallway, pt off  balance and weaker and reported difficulty controlling his muscles several standing rest breaks, up to max a to recover balance x2, pt with B knee buckling needing max a to correct Gait velocity: decreased Gait velocity interpretation: <1.31 ft/sec, indicative of household ambulator Stairs: Yes Stairs assistance: Min guard, Mod assist Stair Management: One rail Left, Two rails, Alternating pattern, Step to pattern, Forwards Number of Stairs: 7 General stair comments: pt ascended/descended 5 steps in PT gym with min guard and step sequencing/safety cues with step-to pattern descending. Pt then instructed to perform with single rail and step-to pattern and pt with near-buckling posterior LOB and c/o increased headache, pt catching feet on each 2 steps descending needing modA for stability/safety despite handrail. RN called to room to assist with further mobility after pt assisted to sit in chair next to steps. MD also notified.     Posture / Balance Dynamic Sitting Balance Sitting balance - Comments: able to don shoes in figure four pattern without assist Balance Overall balance assessment: Needs assistance Sitting-balance support: Feet supported Sitting balance-Leahy Scale: Fair Sitting balance - Comments: able to don shoes in figure four pattern without assist Standing balance support: Bilateral upper extremity supported Standing balance-Leahy Scale: Poor Standing balance comment: poor dynamic balance.     Special needs/care consideration Dialysis: Hemodialysis Monday, Wednesday, and  Friday    Previous Home Environment (from acute therapy documentation) Living Arrangements: Spouse/significant other, Children  Lives With: Spouse Available Help at Discharge: Family, Available 24 hours/day Type of Home: House Home Layout: One level Home Access: Stairs to enter Entrance Stairs-Rails: Right Entrance Stairs-Number of Steps: 10 Bathroom Shower/Tub: Chiropodist:  Standard Bathroom Accessibility: Yes How Accessible: Accessible via walker [place]: No Additional Comments: Pt reports his plumbing is bad and is bathroom is not functional. He states he goes to a nearby store for bathroom needs or used BSC and disposes of it somewhere. He no longer makes urine (on HD).   Discharge Living Setting Plans for Discharge Living Setting: Patient's home, Lives with (comment) (spouse) Type of Home at Discharge: House Discharge Home Layout: One level Discharge Home Access: Stairs to enter Entrance Stairs-Rails: Right Entrance Stairs-Number of Steps: 10 Discharge Bathroom Shower/Tub: Tub/shower unit Discharge Bathroom Toilet: Standard Discharge Bathroom Accessibility: Yes How Accessible: Accessible via walker Does the patient have any problems obtaining your medications?: No   Social/Family/Support Systems Patient Roles: Spouse Anticipated Caregiver: Spouse, [name] Anticipated Caregiver's Contact Information: [phone] Caregiver Availability: 24/7 Discharge Plan Discussed with Primary Caregiver: Yes Is Caregiver In Agreement with Plan?: Yes Does Caregiver/Family have Issues with Lodging/Transportation while Pt is in Rehab?: No   Goals Patient/Family Goal for Rehab: Min A PT, OT Expected length of stay: 12-14 days Pt/Family Agrees to Admission and willing to participate: Yes Program Orientation Provided & Reviewed with Pt/Caregiver Including Roles  & Responsibilities: Yes  Barriers to Discharge: Insurance for SNF coverage   Decrease burden of Care through IP rehab admission: OtherN/A   Possible need for SNF placement upon discharge: not anticipated   Patient Condition: I have reviewed medical records from [facility], spoken with  TOC , and patient and spouse. I met with patient at the bedside and discussed via phone for inpatient rehabilitation assessment.  Patient will benefit from ongoing PT and OT, can actively participate in 3 hours of  therapy a day 5 days of the week, and can make measurable gains during the admission.  Patient will also benefit from the coordinated team approach during an Inpatient Acute Rehabilitation admission.  The patient will receive intensive therapy as well as Rehabilitation physician, nursing, social worker, and care management interventions.  Due to safety, skin/wound care, disease management, medication administration, and patient education the patient requires 24 hour a day rehabilitation nursing.  The patient is currently mod A with mobility and basic ADLs.  Discharge setting and therapy post discharge at home with home health is anticipated.  Patient has agreed to participate in the Acute Inpatient Rehabilitation Program and will admit 11/21/21.   Preadmission Screen Completed By:  Nelly Laurence, 11/20/2021 11:15 AM ______________________________________________________________________   Discussed status with Dr. Dagoberto Ligas on 11/21/21 at 12:57 and received approval for admission today.   Admission Coordinator:  Nelly Laurence, time 12:57/Date 11/21/21    Assessment/Plan: Diagnosis: Does the need for close, 24 hr/day Medical supervision in concert with the patient's rehab needs make it unreasonable for this patient to be served in a less intensive setting? 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 Yes Does the patient require coordinated care of a physician, rehab nurse, PT, OT, and  SLP to address physical and functional deficits in the context of the above medical diagnosis(es)? Yes Addressing deficits in the following areas: balance, endurance, locomotion, strength,  transferring, bowel/bladder control, bathing, dressing, feeding, grooming, toileting, cognition, speech, language, and swallowing Can the patient actively participate in an intensive therapy program of at least 3 hrs of therapy 5 days a week? Yes The potential for patient to make measurable gains while on inpatient rehab is good Anticipated functional outcomes upon discharge from inpatient rehab: min assist PT, min assist OT, min assist SLP Estimated rehab length of stay to reach the above functional goals is: 12-14 days Anticipated discharge destination: Home 10. Overall Rehab/Functional Prognosis: good     MD Signature:

## 2021-11-21 NOTE — Progress Notes (Incomplete)
Patient ID: Kirk [name], male   DOB: 1960/08/29, 61 y.o.   MRN: [id number] INPATIENT REHABILITATION ADMISSION NOTE   Arrival Method: wheelchair     Mental Orientation: a&o x 4   Assessment: completed per flowsheet    Skin:   IV'S:    Pain:   Tubes and Drains: none   Safety Measures: fall plan and bed alarm    Vital Signs:   Height and Weight: 96 kg    Rehab Orientation: completed    Family: family notified by patient     Notes:

## 2021-11-21 NOTE — H&P (Signed)
Physical Medicine and Rehabilitation Admission H&P    Chief Complaint  Patient presents with   Seizures  : HPI: Kirk [name] is a 61 year old right-handed male with history of 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  Per chart review patient lives with spouse and family.  1 level home 3 steps to entry.  Reportedly independent with history of frequent falls and was driving short distances.  Wife can provide assistance as needed.  Presented 11/07/2021 after reported seizure found by his wife lasting approximately 2 minutes.  There has been reports of headache for the past few days.  Admission chemistries unremarkable except glucose 149 BUN 40 creatinine 11.66, hemoglobin 10.1.  EEG no seizure or epileptiform discharge were seen.  Echocardiogram with ejection fraction of 50 to 55% no wall motion abnormalities grade 1 diastolic dysfunction.  Cranial CT scan showed acute subdural hematoma overlying the right frontal convexity measuring 9 mm in greatest radial diameter.  Mild adjacent mass effect and 1-2 mm of leftward midline shift at the septum Pellucidum.  Follow-up neurosurgery Dr. 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  Additional smaller subdural collections along the left cerebral convexity, along the right tentorial leaflet, and along the right inferior temporal lobe measuring up to 5 mm.  Hospital course  follow-up nephrology services hemodialysis ongoing as indicated.  Maintained on Keppra for seizure prophylaxis.  Patient did spike a low-grade fever 100.910/24 concern for SIRS placed on vancomycin cefepime and Flagyl.  Lactic acid level was normal procalcitonin 0.58 WBC normal chest x-ray suggestive of interstitial edema rather than infectious infiltrate.  2 out of 4 blood cultures from 10/25 positive for staph epidermidis likely contaminant he did complete course of antibiotic therapy no further fever noted.  On the afternoon of 11/15/2021 return from dialysis noted to be tachycardic EKG atrial fibrillation with RVR and received a dose of Cardizem converting back to sinus rhythm.  It was felt atrial fibrillation likely provoked due to inability to take medications prior to dialysis.  He was not a candidate for anticoagulation due to hematoma.  Patient is tolerating a regular consistency diet.  Therapy evaluations completed due to patient decreased functional mobility was admitted for a comprehensive rehab program.   Pt reports sore from therapy- frustrated that  so sore- also having HA's every day since admitted to hospital- meds don't work real well.  Itching but said it's due to "high phos levels".  LBM this AM- is regular/daily Is basically anuric- forms ~ 1 oz urine at most daily.   Review of Systems  Constitutional:  Positive for fever and malaise/fatigue.  HENT:  Positive for hearing loss.   Eyes:  Negative for blurred vision and double vision.  Respiratory:  Negative for cough and wheezing.        Occasional shortness of breath with exertion  Cardiovascular:  Positive for palpitations and leg swelling. Negative for chest pain.  Gastrointestinal:  Positive for constipation and nausea. Negative for vomiting.       GERD  Genitourinary:  Negative  for dysuria, flank pain and hematuria.  Musculoskeletal:  Positive for back pain, falls and myalgias.  Skin:  Negative for rash.  Neurological:   Positive for weakness and headaches.  Psychiatric/Behavioral:  Positive for memory loss. The patient has insomnia.        Anxiety  All other systems reviewed and are negative.  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)    Anxiety    Asthma    Asthma    moderate persistent   Carpal tunnel syndrome    Cellulitis and abscess of trunk    Cholelithiasis 07/13/2014   Chronic headaches    Debility, unspecified    Dermatophytosis of the body    Dysrhythmia    history of   Edema    End stage renal disease on dialysis [facility])    "MWF; [facility]" (10/21/2014)   Essential hypertension, benign    GERD (gastroesophageal reflux disease)    Gout, unspecified    HTN (hypertension)    Hypertrophy of prostate without urinary obstruction and other lower urinary tract symptoms (LUTS)    Hypotension, unspecified    Impotence of organic origin    Insomnia, unspecified    Kidney replaced by transplant    Localization-related (focal) (partial) epilepsy and epileptic syndromes with complex partial seizures, without mention of intractable epilepsy    12-15-19- Wife states he has NEVER had a seizure    Lumbago    Memory loss    OSA on CPAP    Other and unspecified hyperlipidemia    controlled /managed per wife    Other chronic nonalcoholic liver disease    Other malaise and fatigue    Other nonspecific abnormal serum enzyme levels    Pain in joint, lower leg    Pain in joint, upper arm    Pneumonia "several times"   Renal dialysis status(V45.11) 02/05/2010   restarted 01/02/13 ofter renal trransplant failure   Secondary hyperparathyroidism (of renal origin)    Shortness of breath    Sleep apnea    wears cpap    Tension headache    Unspecified constipation    Unspecified essential hypertension    Unspecified hereditary and  idiopathic peripheral neuropathy    Unspecified vitamin D deficiency    Past Surgical History:  Procedure Laterality Date   AV FISTULA PLACEMENT Left ?2010   "forearm; at [place]"   [facility]  03/21/2011   CHOLECYSTECTOMY N/A 10/21/2014   Procedure: LAPAROSCOPIC CHOLECYSTECTOMY WITH INTRAOPERATIVE CHOLANGIOGRAM;  Surgeon: Autumn Messing III, MD;  Location: [place];  Service: General;  Laterality: N/A;   COLONOSCOPY     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   KIDNEY TRANSPLANT  08/17/2011   [place]  10/21/2014   w/IOC   LEFT HEART CATHETERIZATION WITH CORONARY ANGIOGRAM N/A 03/21/2011   Procedure: LEFT HEART CATHETERIZATION WITH CORONARY ANGIOGRAM;  Surgeon: [name], MD;  Location: [facility] CATH LAB;  Service: Cardiovascular;  Laterality: N/A;   NEPHRECTOMY  08/2013   removed transplaned kidney   POLYPECTOMY     POSTERIOR FUSION CERVICAL SPINE  06/25/2012   for spinal stenosis   VASECTOMY  2010   Family History  Adopted: Yes  Problem Relation Age of Onset  Colon cancer Neg Hx    Esophageal cancer Neg Hx    Rectal cancer Neg Hx    Stomach cancer Neg Hx    Colon polyps Neg Hx    Social History:  reports that he quit smoking about 20 months ago. His smoking use included cigarettes. He started smoking about 30 years ago. He has a 16.00 pack-year smoking history. He has never used smokeless tobacco. He reports that he does not drink alcohol and does not use drugs. Allergies:  Allergies  Allergen Reactions   Codeine Nausea And Vomiting   Hydrocodone-Acetaminophen Nausea And Vomiting   Medications Prior to Admission  Medication Sig Dispense Refill   acetaminophen (TYLENOL) 500 MG tablet Take 1 tablet (500 mg total) by mouth every 6 (six) hours as needed for moderate pain.     amLODipine (NORVASC) 10 MG tablet Take 1 tablet (10 mg total) by mouth daily. 90 tablet 3   b complex-vitamin c-folic  acid (NEPHRO-VITE) 0.8 MG TABS tablet TAKE 1 TABLET BY MOUTH EVERY DAY (Patient taking differently: Take 1 tablet by mouth daily.) 90 tablet 3   calcitRIOL (ROCALTROL) 0.5 MCG capsule Take 2 capsules (1 mcg total) by mouth every Monday, Wednesday, and Friday with hemodialysis. 60 capsule 1   Cholecalciferol (VITAMIN D3) 25 MCG (1000 UT) CAPS Take 4,000 Units by mouth daily.     diphenhydrAMINE (BENADRYL) 25 MG tablet Take 50 mg by mouth at bedtime.     doxazosin (CARDURA) 4 MG tablet Take 1 tablet (4 mg total) by mouth daily. 90 tablet 1   guaiFENesin (MUCINEX) 600 MG 12 hr tablet Take 1,200 mg by mouth 2 (two) times daily.     hydrALAZINE (APRESOLINE) 100 MG tablet TAKE 1 TABLET EVERY MORNINGAND AT BEDTIME. (Patient taking differently: Take 100 mg by mouth 2 (two) times daily as needed (for systolic blood pressure greater than 140).) 180 tablet 2   isosorbide mononitrate (IMDUR) 30 MG 24 hr tablet TAKE 1 TABLET DAILY (Patient taking differently: Take 30 mg by mouth daily.) 90 tablet 1   lidocaine-prilocaine (EMLA) cream Apply 1 application topically See admin instructions. Apply 1 to 2 hours prior to dialysis on Mondays, Wednesdays, and Fridays. Cover with saran wrap.     methocarbamol (ROBAXIN) 750 MG tablet Take 1 tablet (750 mg total) by mouth in the morning, at noon, in the evening, and at bedtime. 180 tablet 1   metoprolol tartrate (LOPRESSOR) 100 MG tablet TAKE 1 TABLET BY MOUTH TWICE A DAY (Patient taking differently: Take 100 mg by mouth 2 (two) times daily.) 180 tablet 1   omeprazole (PRILOSEC) 20 MG capsule TAKE 1 CAPSULE TWICE DAILY BEFORE A MEAL (Patient taking differently: Take 20 mg by mouth 2 (two) times daily before a meal.) 180 capsule 3   ondansetron (ZOFRAN ODT) 4 MG disintegrating tablet Take 1 tablet (4 mg total) by mouth every 8 (eight) hours as needed for nausea or vomiting. 14 tablet 0   pravastatin (PRAVACHOL) 40 MG tablet TAKE 1 TABLET BY MOUTH EVERY DAY IN THE EVENING  (Patient taking differently: Take 40 mg by mouth daily.) 90 tablet 2   RENVELA 800 MG tablet Take 1,600-2,400 mg by mouth See admin instructions. Take 2,400 mg by mouth three times a day with meals and 1600 mg with each snack     traMADol (ULTRAM) 50 MG tablet Take 1 tablet (50 mg total) by mouth daily as needed. (Patient taking differently: Take 50 mg by mouth daily as needed for moderate  pain.) 30 tablet 0   traZODone (DESYREL) 50 MG tablet TAKE 1/2 TO 1 TABLET BY MOUTH AT BEDTIME AS NEEDED FOR SLEEP (Patient taking differently: Take 50 mg by mouth at bedtime.) 90 tablet 1   vitamin C (ASCORBIC ACID) 250 MG tablet Take 1 tablet (250 mg total) by mouth daily. 90 tablet 3   albuterol (PROVENTIL HFA;VENTOLIN HFA) 108 (90 Base) MCG/ACT inhaler Inhale 2 puffs into the lungs every 6 (six) hours as needed for wheezing or shortness of breath. (Patient not taking: Reported on 11/07/2021) 1 Inhaler 0   cinacalcet (SENSIPAR) 30 MG tablet Take 30 mg by mouth daily. (Patient not taking: Reported on 23/76/2831)     folic acid in sodium chloride 0.9 % 50 mL Inject into the vein. And B-Complex.     ipratropium-albuterol (DUONEB) 0.5-2.5 (3) MG/3ML SOLN Take 3 mLs by nebulization every 4 (four) hours as needed (Shortness of breath). (Patient not taking: Reported on 11/07/2021) 360 mL 0   PRESCRIPTION MEDICATION See admin instructions. CPAP- At bedtime and during any time of rest        Home: Home Living Family/patient expects to be discharged to:: Private residence Living Arrangements: Spouse/significant other, Children Available Help at Discharge: Family, Available 24 hours/day Type of Home: House Home Access: Stairs to enter [employer] of Steps: 10 Entrance Stairs-Rails: Right Home Layout: One level Bathroom Shower/Tub: Chiropodist: Standard Bathroom Accessibility: Yes Home Equipment: Cane - single point Additional Comments: Pt reports his plumbing is bad and is  bathroom is not functional. He states he goes to a nearby store for bathroom needs or used BSC and disposes of it somewhere. He no longer makes urine (on HD).  Lives With: Spouse   Functional History: Prior Function Prior Level of Function : Independent/Modified Independent, History of Falls (last six months) Mobility Comments: using cane or rollator at baseline, frequent falls ADLs Comments: sponge bathes  Functional Status:  Mobility: Bed Mobility Overal bed mobility: Needs Assistance Bed Mobility: Sit to Supine, Supine to Sit Sidelying to sit: Min assist, HOB elevated Supine to sit: Min guard Sit to supine: Min guard General bed mobility comments: pt seated EOB pre and post session Transfers Overall transfer level: Needs assistance Equipment used: Rolling walker (2 wheels) Transfers: Sit to/from Stand Sit to Stand: Min guard Bed to/from chair/wheelchair/BSC transfer type:: Stand pivot Stand pivot transfers: Min guard Step pivot transfers: +2 safety/equipment, Mod assist General transfer comment: Min guard A for safety, cues for hand placement as pt with tendency to pull on RW Ambulation/Gait Ambulation/Gait assistance: Min assist, Mod assist, Max assist Gait Distance (Feet): 100 Feet Assistive device: Rolling walker (2 wheels) Gait Pattern/deviations: Step-through pattern, Decreased stride length, Staggering right, Staggering left General Gait Details: initially walking with min a for safety, as progressed into hallway, pt off balance and weaker and reported difficulty controlling his muscles several standing rest breaks, up to max a to recover balance x2, pt with B knee buckling needing max a to correct Gait velocity: decreased Gait velocity interpretation: <1.31 ft/sec, indicative of household ambulator Stairs: Yes Stairs assistance: Min guard, Mod assist Stair Management: One rail Left, Two rails, Alternating pattern, Step to pattern, Forwards Number of Stairs: 7 General  stair comments: pt ascended/descended 5 steps in PT gym with min guard and step sequencing/safety cues with step-to pattern descending. Pt then instructed to perform with single rail and step-to pattern and pt with near-buckling posterior LOB and c/o increased headache, pt catching feet on each  2 steps descending needing modA for stability/safety despite handrail. RN called to room to assist with further mobility after pt assisted to sit in chair next to steps. MD also notified.    ADL: ADL Overall ADL's : Needs assistance/impaired Eating/Feeding: Independent, Sitting Grooming: Wash/dry face, Min guard, Standing Grooming Details (indicate cue type and reason): Min guard A for safety. Observed to use both R and L UE to wash face and back of neck. Both UE moving functionally Upper Body Bathing: Set up, Sitting Lower Body Bathing: Min guard, Sit to/from stand Upper Body Dressing : Set up, Sitting Lower Body Dressing: Min guard, Sit to/from stand Lower Body Dressing Details (indicate cue type and reason): Min guard A for safety. Donning pants and shoes without physical assist this session. Toilet Transfer: Min guard, Ambulation, Rolling walker (2 wheels) Toilet Transfer Details (indicate cue type and reason): simulated in room Toileting- Clothing Manipulation and Hygiene: Min guard, Sit to/from stand Functional mobility during ADLs: Minimal assistance, Rolling walker (2 wheels) General ADL Comments: Min A for functional mobility in hall due to decreased safety with RW and frequent L foot dragging, especially when distracted.  Cognition: Cognition Overall Cognitive Status: Within Functional Limits for tasks assessed Orientation Level: Oriented X4 Cognition Arousal/Alertness: Awake/alert Behavior During Therapy: Flat affect Overall Cognitive Status: Within Functional Limits for tasks assessed Area of Impairment: Following commands, Safety/judgement, Problem solving, Attention Current Attention  Level: Selective Following Commands: Follows one step commands consistently, Follows one step commands with increased time Safety/Judgement: Decreased awareness of safety Awareness: Intellectual, Emergent Problem Solving: Slow processing General Comments: Pt following one step commands with increased time. conversational and pleasant this session/agreeable to mobility. Mod cues to follow all multistep commands with increased cues throughout for safety awareness  Physical Exam: Blood pressure (!) 126/98, pulse 69, temperature 98 F (36.7 C), temperature source Oral, resp. rate 18, height '5\' 7"'$  (1.702 m), weight 94.1 kg, SpO2 97 %. Physical Exam Vitals and nursing note reviewed.  Constitutional:      General: He is not in acute distress.    Appearance: Normal appearance. He is obese. He is ill-appearing.     Comments: Sitting EOB, just finishing lunch; frustrated with HA's- awake, alert, NAD  HENT:     Head: Normocephalic and atraumatic.     Comments: Smile appears equal and tongue midline    Right Ear: External ear normal.     Left Ear: External ear normal.     Nose: Nose normal. No congestion.     Mouth/Throat:     Mouth: Mucous membranes are dry.     Pharynx: Oropharynx is clear. No oropharyngeal exudate.  Eyes:     General:        Right eye: No discharge.        Left eye: No discharge.     Extraocular Movements: Extraocular movements intact.  Cardiovascular:     Rate and Rhythm: Normal rate and regular rhythm.     Heart sounds: Normal heart sounds. No murmur heard.    No gallop.  Pulmonary:     Effort: Pulmonary effort is normal. No respiratory distress.     Breath sounds: Normal breath sounds. No wheezing, rhonchi or rales.  Abdominal:     General: Bowel sounds are normal. There is no distension.     Palpations: Abdomen is soft.     Tenderness: There is no abdominal tenderness.  Musculoskeletal:     Cervical back: Neck supple. No tenderness.     Comments:  Ue's 4/5 except  FA 3+/5 B/L- from cervical myelopathy per pt LE's R HF 4/5 and L HF 4+/5; KE 4+/5 B/L; and DF/PF 5-/5 B/L  Skin:    General: Skin is warm and dry.     Comments: R forearm IV- looks OK (+) thrill from L forearm fistula  Neurological:     Mental Status: He is alert and oriented to person, place, and time.     Comments: Patient is alert.  Resting comfortably.  Makes eye contact with examiner.  He does follow simple commands.  Provides name and age.  [place]. Intact to light touch per pt in all 4 extremities   Psychiatric:     Comments: A little frustrated over HA's.      Results for orders placed or performed during the hospital encounter of 11/07/21 (from the past 48 hour(s))  Glucose, capillary     Status: Abnormal   Collection Time: 11/19/21 10:08 PM  Result Value Ref Range   Glucose-Capillary 163 (H) 70 - 99 mg/dL    Comment: Glucose reference range applies only to samples taken after fasting for at least 8 hours.   Comment 1 Notify RN   Glucose, capillary     Status: None   Collection Time: 11/20/21  4:54 AM  Result Value Ref Range   Glucose-Capillary 93 70 - 99 mg/dL    Comment: Glucose reference range applies only to samples taken after fasting for at least 8 hours.   Comment 1 Notify RN   CBC     Status: Abnormal   Collection Time: 11/21/21  3:21 AM  Result Value Ref Range   WBC 4.7 4.0 - 10.5 K/uL   RBC 2.76 (L) 4.22 - 5.81 MIL/uL   Hemoglobin 8.3 (L) 13.0 - 17.0 g/dL   HCT 26.0 (L) 39.0 - 52.0 %   MCV 94.2 80.0 - 100.0 fL   MCH 30.1 26.0 - 34.0 pg   MCHC 31.9 30.0 - 36.0 g/dL   RDW 13.4 11.5 - 15.5 %   Platelets 101 (L) 150 - 400 K/uL   nRBC 0.0 0.0 - 0.2 %    Comment: Performed at [facility], [place] [address], [place], [place] [address]  Glucose, capillary     Status: None   Collection Time: 11/21/21  3:30 AM  Result Value Ref Range   Glucose-Capillary 94 70 - 99 mg/dL    Comment: Glucose reference range applies only to samples taken after  fasting for at least 8 hours.   Comment 1 Notify RN    No results found.    Blood pressure (!) 126/98, pulse 69, temperature 98 F (36.7 C), temperature source Oral, resp. rate 18, height '5\' 7"'$  (1.702 m), weight 94.1 kg, SpO2 97 %.  Medical Problem List and Plan: 1. Functional deficits secondary to subdural hematoma/seizure  -patient may  shower  -ELOS/Goals: min A 12-14 days 2.  Antithrombotics: -DVT/anticoagulation:  Mechanical: Antiembolism stockings, thigh (TED hose) Bilateral lower extremities  -antiplatelet therapy: N/A 3. Pain Management: Oxycodone/Robaxin as needed 4. Mood/Behavior/Sleep/history of memory loss: Trazodone 50 mg nightly  -antipsychotic agents: N/A 5. Neuropsych/cognition: This patient is capable of making decisions on his own behalf. 6. Skin/Wound Care: Routine skin checks 7. Fluids/Electrolytes/Nutrition: Routine in and outs with follow-up chemistries 8.  End-stage renal disease.  Continue hemodialysis as directed- is basically anuric- forms 1 oz/urine/day at most.  9.  Chronic headaches.  Lyrica 50 mg daily- has had since admission to hospital- from  [facility]- could topamax be used or Elavil? For prevention- not getting any results so far with Lyrica 10.  Seizure prophylaxis.  Continue Keppra as indicated by neurology. 11.  PAF with RVR.  Not a candidate for anticoagulation.  Lopressor 100 mg twice daily 12.  Hypertension.  Cardura 4 mg daily, Cozaar 100 mg daily, hydralazine 100 mg every 8 hours, Imdur 60 mg daily.  Monitor with increased mobility 13.  Hyperlipidemia.  Pravachol 14.  Anemia of chronic disease.  Follow-up CBC 15.  GERD.  Protonix/Carafate 16.  Chronic diastolic congestive heart failure.  Monitor for any signs of fluid overload 17.  Obesity.  BMI 32.49.  Dietary follow-up 18.  OSA.  CPAP   I have personally performed a face to face diagnostic evaluation of this patient and formulated the key components of the plan.  Additionally, I have  personally reviewed laboratory data, imaging studies, as well as relevant notes and concur with the physician assistant's documentation above.   The patient's status has not changed from the original H&P.  Any changes in documentation from the acute care chart have been noted above.    Lavon Paganini Angiulli, PA-C 11/21/2021

## 2021-11-21 NOTE — H&P (Signed)
Physical Medicine and Rehabilitation Admission H&P        Chief Complaint  Patient presents with   Seizures  : HPI: Kirk [name] is a 61 year old right-handed male with history of 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  Per chart review patient lives with spouse and family.  1 level home 3 steps to entry.  Reportedly independent with history of frequent falls and was driving short distances.  Wife can provide assistance as needed.  Presented 11/07/2021 after reported seizure found by his wife lasting approximately 2 minutes.  There has been reports of headache for the past few days.  Admission chemistries unremarkable except glucose 149 BUN 40 creatinine 11.66, hemoglobin 10.1.  EEG no seizure or epileptiform discharge were seen.  Echocardiogram with ejection fraction of 50 to 55% no wall motion abnormalities grade 1 diastolic dysfunction.  Cranial CT scan showed acute subdural hematoma overlying the right frontal convexity measuring 9 mm in greatest radial diameter.  Mild adjacent mass effect and 1-2 mm of leftward midline shift at the septum Pellucidum.  Follow-up neurosurgery Dr. 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  Additional smaller subdural collections along the left cerebral convexity, along the right tentorial leaflet, and along the right inferior temporal lobe measuring up to 5 mm.  Hospital course  follow-up nephrology services hemodialysis ongoing as indicated.  Maintained on Keppra for seizure prophylaxis.  Patient did spike a low-grade fever 100.910/24 concern for SIRS placed on vancomycin cefepime and Flagyl.  Lactic acid level was normal procalcitonin 0.58 WBC normal chest x-ray suggestive of interstitial edema rather than infectious infiltrate.  2 out of 4 blood cultures from 10/25 positive for staph epidermidis likely contaminant he did complete course of antibiotic therapy no further fever noted.  On the afternoon of 11/15/2021 return from dialysis noted to be tachycardic EKG atrial fibrillation with RVR and received a dose of Cardizem converting back to sinus rhythm.  It was felt atrial fibrillation likely provoked due to inability to take medications prior to dialysis.  He was not a candidate for anticoagulation due to hematoma.  Patient is tolerating a regular consistency diet.  Therapy evaluations completed due to patient decreased functional mobility was admitted for a comprehensive rehab program.     Pt reports sore from therapy- frustrated that  so sore- also having HA's every day since admitted to hospital- meds don't work real well.  Itching but said it's due to "high phos levels".  LBM this AM- is regular/daily Is basically anuric- forms ~ 1 oz urine at most daily.    Review of Systems  Constitutional:  Positive for fever and malaise/fatigue.  HENT:  Positive for hearing loss.   Eyes:  Negative for blurred vision and double vision.  Respiratory:  Negative for cough and wheezing.        Occasional shortness of breath with exertion  Cardiovascular:  Positive for palpitations and leg swelling. Negative for chest pain.  Gastrointestinal:  Positive for constipation and nausea. Negative for vomiting.  GERD  Genitourinary:  Negative for dysuria, flank pain and hematuria.  Musculoskeletal:  Positive for back pain, falls and myalgias.  Skin:  Negative for rash.  Neurological:   Positive for weakness and headaches.  Psychiatric/Behavioral:  Positive for memory loss. The patient has insomnia.        Anxiety  All other systems reviewed and are negative.       Past Medical History:  Diagnosis Date   Acute edema of lung, unspecified     Acute, but ill-defined, cerebrovascular disease     Allergy     Anemia     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)     Anxiety     Asthma     Asthma      moderate persistent   Carpal tunnel syndrome     Cellulitis and abscess of trunk     Cholelithiasis 07/13/2014   Chronic headaches     Debility, unspecified     Dermatophytosis of the body     Dysrhythmia      history of   Edema     End stage renal disease on dialysis [facility])      "MWF; [facility]" (10/21/2014)   Essential hypertension, benign     GERD (gastroesophageal reflux disease)     Gout, unspecified     HTN (hypertension)     Hypertrophy of prostate without urinary obstruction and other lower urinary tract symptoms (LUTS)     Hypotension, unspecified     Impotence of organic origin     Insomnia, unspecified     Kidney replaced by transplant     Localization-related (focal) (partial) epilepsy and epileptic syndromes with complex partial seizures, without mention of intractable epilepsy      12-15-19- Wife states he has NEVER had a seizure    Lumbago     Memory loss     OSA on CPAP     Other and unspecified hyperlipidemia      controlled /managed per wife    Other chronic nonalcoholic liver disease     Other malaise and fatigue     Other nonspecific abnormal serum enzyme levels     Pain in joint, lower leg     Pain in joint, upper arm     Pneumonia "several times"   Renal dialysis status(V45.11) 02/05/2010    restarted 01/02/13 ofter renal trransplant failure   Secondary hyperparathyroidism (of renal origin)     Shortness of breath     Sleep apnea      wears cpap    Tension headache     Unspecified constipation     Unspecified  essential hypertension     Unspecified hereditary and idiopathic peripheral neuropathy     Unspecified vitamin D deficiency           Past Surgical History:  Procedure Laterality Date   AV FISTULA PLACEMENT Left ?2010    "forearm; at [place]"   [place]   03/21/2011   CHOLECYSTECTOMY N/A 10/21/2014    Procedure: LAPAROSCOPIC CHOLECYSTECTOMY WITH INTRAOPERATIVE CHOLANGIOGRAM;  Surgeon: Autumn Messing III, MD;  Location: [place];  Service: General;  Laterality: N/A;   COLONOSCOPY       INNER EAR SURGERY Bilateral 1973    for deafness   KIDNEY TRANSPLANT   08/17/2011    [place]   10/21/2014    w/IOC   LEFT HEART CATHETERIZATION  WITH CORONARY ANGIOGRAM N/A 03/21/2011    Procedure: LEFT HEART CATHETERIZATION WITH CORONARY ANGIOGRAM;  Surgeon: [name], MD;  Location[facility] CATH LAB;  Service: Cardiovascular;  Laterality: N/A;   NEPHRECTOMY   08/2013    removed transplaned kidney   POLYPECTOMY       POSTERIOR FUSION CERVICAL SPINE   06/25/2012    for spinal stenosis   VASECTOMY   2010         Family History  Adopted: Yes  Problem Relation Age of Onset   Colon cancer Neg Hx     Esophageal cancer Neg Hx     Rectal cancer Neg Hx     Stomach cancer Neg Hx     Colon polyps Neg Hx      Social History:  reports that he quit smoking about 20 months ago. His smoking use included cigarettes. He started smoking about 30 years ago. He has a 16.00 pack-year smoking history. He has never used smokeless tobacco. He reports that he does not drink alcohol and does not use drugs. Allergies:      Allergies  Allergen Reactions   Codeine Nausea And Vomiting   Hydrocodone-Acetaminophen Nausea And Vomiting          Medications Prior to Admission  Medication Sig Dispense Refill   acetaminophen (TYLENOL) 500 MG tablet Take 1 tablet (500 mg total) by mouth every 6 (six) hours as needed for moderate pain.        amLODipine (NORVASC) 10 MG tablet Take 1 tablet (10 mg total) by mouth daily. 90 tablet 3   b complex-vitamin c-folic acid (NEPHRO-VITE) 0.8 MG TABS tablet TAKE 1 TABLET BY MOUTH EVERY DAY (Patient taking differently: Take 1 tablet by mouth daily.) 90 tablet 3   calcitRIOL (ROCALTROL) 0.5 MCG capsule Take 2 capsules (1 mcg total) by mouth every Monday, Wednesday, and Friday with hemodialysis. 60 capsule 1   Cholecalciferol (VITAMIN D3) 25 MCG (1000 UT) CAPS Take 4,000 Units by mouth daily.       diphenhydrAMINE (BENADRYL) 25 MG tablet Take 50 mg by mouth at bedtime.       doxazosin (CARDURA) 4 MG tablet Take 1 tablet (4 mg total) by mouth daily. 90 tablet 1   guaiFENesin (MUCINEX) 600 MG 12 hr tablet Take 1,200 mg by mouth 2 (two) times daily.       hydrALAZINE (APRESOLINE) 100 MG tablet TAKE 1 TABLET EVERY MORNINGAND AT BEDTIME. (Patient taking differently: Take 100 mg by mouth 2 (two) times daily as needed (for systolic blood pressure greater than 140).) 180 tablet 2   isosorbide mononitrate (IMDUR) 30 MG 24 hr tablet TAKE 1 TABLET DAILY (Patient taking differently: Take 30 mg by mouth daily.) 90 tablet 1   lidocaine-prilocaine (EMLA) cream Apply 1 application topically See admin instructions. Apply 1 to 2 hours prior to dialysis on Mondays, Wednesdays, and Fridays. Cover with saran wrap.       methocarbamol (ROBAXIN) 750 MG tablet Take 1 tablet (750 mg total) by mouth in the morning, at noon, in the evening, and at bedtime. 180 tablet 1   metoprolol tartrate (LOPRESSOR) 100 MG tablet TAKE 1 TABLET BY MOUTH TWICE A DAY (Patient taking differently: Take 100 mg by mouth 2 (two) times daily.) 180 tablet 1   omeprazole (PRILOSEC) 20 MG capsule TAKE 1 CAPSULE TWICE DAILY BEFORE A MEAL (Patient taking differently: Take 20 mg by mouth 2 (two) times daily before a meal.) 180 capsule 3   ondansetron (ZOFRAN  ODT) 4 MG disintegrating tablet Take 1 tablet (4 mg total) by mouth every 8 (eight) hours as needed for  nausea or vomiting. 14 tablet 0   pravastatin (PRAVACHOL) 40 MG tablet TAKE 1 TABLET BY MOUTH EVERY DAY IN THE EVENING (Patient taking differently: Take 40 mg by mouth daily.) 90 tablet 2   RENVELA 800 MG tablet Take 1,600-2,400 mg by mouth See admin instructions. Take 2,400 mg by mouth three times a day with meals and 1600 mg with each snack       traMADol (ULTRAM) 50 MG tablet Take 1 tablet (50 mg total) by mouth daily as needed. (Patient taking differently: Take 50 mg by mouth daily as needed for moderate pain.) 30 tablet 0   traZODone (DESYREL) 50 MG tablet TAKE 1/2 TO 1 TABLET BY MOUTH AT BEDTIME AS NEEDED FOR SLEEP (Patient taking differently: Take 50 mg by mouth at bedtime.) 90 tablet 1   vitamin C (ASCORBIC ACID) 250 MG tablet Take 1 tablet (250 mg total) by mouth daily. 90 tablet 3   albuterol (PROVENTIL HFA;VENTOLIN HFA) 108 (90 Base) MCG/ACT inhaler Inhale 2 puffs into the lungs every 6 (six) hours as needed for wheezing or shortness of breath. (Patient not taking: Reported on 11/07/2021) 1 Inhaler 0   cinacalcet (SENSIPAR) 30 MG tablet Take 30 mg by mouth daily. (Patient not taking: Reported on 82/95/6213)       folic acid in sodium chloride 0.9 % 50 mL Inject into the vein. And B-Complex.       ipratropium-albuterol (DUONEB) 0.5-2.5 (3) MG/3ML SOLN Take 3 mLs by nebulization every 4 (four) hours as needed (Shortness of breath). (Patient not taking: Reported on 11/07/2021) 360 mL 0   PRESCRIPTION MEDICATION See admin instructions. CPAP- At bedtime and during any time of rest              Home: Home Living Family/patient expects to be discharged to:: Private residence Living Arrangements: Spouse/significant other, Children Available Help at Discharge: Family, Available 24 hours/day Type of Home: House Home Access: Stairs to enter [employer] of Steps: 10 Entrance Stairs-Rails: Right Home Layout: One level Bathroom Shower/Tub: Chiropodist:  Standard Bathroom Accessibility: Yes Home Equipment: Cane - single point Additional Comments: Pt reports his plumbing is bad and is bathroom is not functional. He states he goes to a nearby store for bathroom needs or used BSC and disposes of it somewhere. He no longer makes urine (on HD).  Lives With: Spouse   Functional History: Prior Function Prior Level of Function : Independent/Modified Independent, History of Falls (last six months) Mobility Comments: using cane or rollator at baseline, frequent falls ADLs Comments: sponge bathes   Functional Status:  Mobility: Bed Mobility Overal bed mobility: Needs Assistance Bed Mobility: Sit to Supine, Supine to Sit Sidelying to sit: Min assist, HOB elevated Supine to sit: Min guard Sit to supine: Min guard General bed mobility comments: pt seated EOB pre and post session Transfers Overall transfer level: Needs assistance Equipment used: Rolling walker (2 wheels) Transfers: Sit to/from Stand Sit to Stand: Min guard Bed to/from chair/wheelchair/BSC transfer type:: Stand pivot Stand pivot transfers: Min guard Step pivot transfers: +2 safety/equipment, Mod assist General transfer comment: Min guard A for safety, cues for hand placement as pt with tendency to pull on RW Ambulation/Gait Ambulation/Gait assistance: Min assist, Mod assist, Max assist Gait Distance (Feet): 100 Feet Assistive device: Rolling walker (2 wheels) Gait Pattern/deviations: Step-through pattern, Decreased stride length, Staggering right, Staggering  left General Gait Details: initially walking with min a for safety, as progressed into hallway, pt off balance and weaker and reported difficulty controlling his muscles several standing rest breaks, up to max a to recover balance x2, pt with B knee buckling needing max a to correct Gait velocity: decreased Gait velocity interpretation: <1.31 ft/sec, indicative of household ambulator Stairs: Yes Stairs assistance: Min  guard, Mod assist Stair Management: One rail Left, Two rails, Alternating pattern, Step to pattern, Forwards Number of Stairs: 7 General stair comments: pt ascended/descended 5 steps in PT gym with min guard and step sequencing/safety cues with step-to pattern descending. Pt then instructed to perform with single rail and step-to pattern and pt with near-buckling posterior LOB and c/o increased headache, pt catching feet on each 2 steps descending needing modA for stability/safety despite handrail. RN called to room to assist with further mobility after pt assisted to sit in chair next to steps. MD also notified.   ADL: ADL Overall ADL's : Needs assistance/impaired Eating/Feeding: Independent, Sitting Grooming: Wash/dry face, Min guard, Standing Grooming Details (indicate cue type and reason): Min guard A for safety. Observed to use both R and L UE to wash face and back of neck. Both UE moving functionally Upper Body Bathing: Set up, Sitting Lower Body Bathing: Min guard, Sit to/from stand Upper Body Dressing : Set up, Sitting Lower Body Dressing: Min guard, Sit to/from stand Lower Body Dressing Details (indicate cue type and reason): Min guard A for safety. Donning pants and shoes without physical assist this session. Toilet Transfer: Min guard, Ambulation, Rolling walker (2 wheels) Toilet Transfer Details (indicate cue type and reason): simulated in room Toileting- Clothing Manipulation and Hygiene: Min guard, Sit to/from stand Functional mobility during ADLs: Minimal assistance, Rolling walker (2 wheels) General ADL Comments: Min A for functional mobility in hall due to decreased safety with RW and frequent L foot dragging, especially when distracted.   Cognition: Cognition Overall Cognitive Status: Within Functional Limits for tasks assessed Orientation Level: Oriented X4 Cognition Arousal/Alertness: Awake/alert Behavior During Therapy: Flat affect Overall Cognitive Status: Within  Functional Limits for tasks assessed Area of Impairment: Following commands, Safety/judgement, Problem solving, Attention Current Attention Level: Selective Following Commands: Follows one step commands consistently, Follows one step commands with increased time Safety/Judgement: Decreased awareness of safety Awareness: Intellectual, Emergent Problem Solving: Slow processing General Comments: Pt following one step commands with increased time. conversational and pleasant this session/agreeable to mobility. Mod cues to follow all multistep commands with increased cues throughout for safety awareness   Physical Exam: Blood pressure (!) 126/98, pulse 69, temperature 98 F (36.7 C), temperature source Oral, resp. rate 18, height '5\' 7"'$  (1.702 m), weight 94.1 kg, SpO2 97 %. Physical Exam Vitals and nursing note reviewed.  Constitutional:      General: He is not in acute distress.    Appearance: Normal appearance. He is obese. He is ill-appearing.     Comments: Sitting EOB, just finishing lunch; frustrated with HA's- awake, alert, NAD  HENT:     Head: Normocephalic and atraumatic.     Comments: Smile appears equal and tongue midline    Right Ear: External ear normal.     Left Ear: External ear normal.     Nose: Nose normal. No congestion.     Mouth/Throat:     Mouth: Mucous membranes are dry.     Pharynx: Oropharynx is clear. No oropharyngeal exudate.  Eyes:     General:  Right eye: No discharge.        Left eye: No discharge.     Extraocular Movements: Extraocular movements intact.  Cardiovascular:     Rate and Rhythm: Normal rate and regular rhythm.     Heart sounds: Normal heart sounds. No murmur heard.    No gallop.  Pulmonary:     Effort: Pulmonary effort is normal. No respiratory distress.     Breath sounds: Normal breath sounds. No wheezing, rhonchi or rales.  Abdominal:     General: Bowel sounds are normal. There is no distension.     Palpations: Abdomen is soft.      Tenderness: There is no abdominal tenderness.  Musculoskeletal:     Cervical back: Neck supple. No tenderness.     Comments: Ue's 4/5 except FA 3+/5 B/L- from cervical myelopathy per pt LE's R HF 4/5 and L HF 4+/5; KE 4+/5 B/L; and DF/PF 5-/5 B/L  Skin:    General: Skin is warm and dry.     Comments: R forearm IV- looks OK (+) thrill from L forearm fistula  Neurological:     Mental Status: He is alert and oriented to person, place, and time.     Comments: Patient is alert.  Resting comfortably.  Makes eye contact with examiner.  He does follow simple commands.  Provides name and age.  [place]. Intact to light touch per pt in all 4 extremities   Psychiatric:     Comments: A little frustrated over HA's.         Lab Results Last 48 Hours        Results for orders placed or performed during the hospital encounter of 11/07/21 (from the past 48 hour(s))  Glucose, capillary     Status: Abnormal    Collection Time: 11/19/21 10:08 PM  Result Value Ref Range    Glucose-Capillary 163 (H) 70 - 99 mg/dL      Comment: Glucose reference range applies only to samples taken after fasting for at least 8 hours.    Comment 1 Notify RN    Glucose, capillary     Status: None    Collection Time: 11/20/21  4:54 AM  Result Value Ref Range    Glucose-Capillary 93 70 - 99 mg/dL      Comment: Glucose reference range applies only to samples taken after fasting for at least 8 hours.    Comment 1 Notify RN    CBC     Status: Abnormal    Collection Time: 11/21/21  3:21 AM  Result Value Ref Range    WBC 4.7 4.0 - 10.5 K/uL    RBC 2.76 (L) 4.22 - 5.81 MIL/uL    Hemoglobin 8.3 (L) 13.0 - 17.0 g/dL    HCT 26.0 (L) 39.0 - 52.0 %    MCV 94.2 80.0 - 100.0 fL    MCH 30.1 26.0 - 34.0 pg    MCHC 31.9 30.0 - 36.0 g/dL    RDW 13.4 11.5 - 15.5 %    Platelets 101 (L) 150 - 400 K/uL    nRBC 0.0 0.0 - 0.2 %      Comment: Performed at [facility], [place] [address] [place], [place] [address]   Glucose, capillary     Status: None    Collection Time: 11/21/21  3:30 AM  Result Value Ref Range    Glucose-Capillary 94 70 - 99 mg/dL      Comment: Glucose reference range applies only  to samples taken after fasting for at least 8 hours.    Comment 1 Notify RN        Imaging Results (Last 48 hours)  No results found.         Blood pressure (!) 126/98, pulse 69, temperature 98 F (36.7 C), temperature source Oral, resp. rate 18, height '5\' 7"'$  (1.702 m), weight 94.1 kg, SpO2 97 %.   Medical Problem List and Plan: 1. Functional deficits secondary to subdural hematoma/seizure             -patient may  shower             -ELOS/Goals: min A 12-14 days 2.  Antithrombotics: -DVT/anticoagulation:  Mechanical: Antiembolism stockings, thigh (TED hose) Bilateral lower extremities             -antiplatelet therapy: N/A 3. Pain Management: Oxycodone/Robaxin as needed 4. Mood/Behavior/Sleep/history of memory loss: Trazodone 50 mg nightly             -antipsychotic agents: N/A 5. Neuropsych/cognition: This patient is capable of making decisions on his own behalf. 6. Skin/Wound Care: Routine skin checks 7. Fluids/Electrolytes/Nutrition: Routine in and outs with follow-up chemistries 8.  End-stage renal disease.  Continue hemodialysis as directed- is basically anuric- forms 1 oz/urine/day at most.  9.  Chronic headaches.  Lyrica 50 mg daily- has had since admission to hospital- from [facility]- could topamax be used or Elavil? For prevention- not getting any results so far with Lyrica 10.  Seizure prophylaxis.  Continue Keppra as indicated by neurology. 11.  PAF with RVR.  Not a candidate for anticoagulation.  Lopressor 100 mg twice daily 12.  Hypertension.  Cardura 4 mg daily, Cozaar 100 mg daily, hydralazine 100 mg every 8 hours, Imdur 60 mg daily.  Monitor with increased mobility 13.  Hyperlipidemia.  Pravachol 14.  Anemia of chronic disease.  Follow-up CBC 15.  GERD.  Protonix/Carafate 16.   Chronic diastolic congestive heart failure.  Monitor for any signs of fluid overload 17.  Obesity.  BMI 32.49.  Dietary follow-up 18.  OSA.  CPAP     I have personally performed a face to face diagnostic evaluation of this patient and formulated the key components of the plan.  Additionally, I have personally reviewed laboratory data, imaging studies, as well as relevant notes and concur with the physician assistant's documentation above.   The patient's status has not changed from the original H&P.  Any changes in documentation from the acute care chart have been noted above.     Lavon Paganini Angiulli, PA-C 11/21/2021

## 2021-11-22 DIAGNOSIS — D631 Anemia in chronic kidney disease: Secondary | ICD-10-CM | POA: Diagnosis not present

## 2021-11-22 DIAGNOSIS — I132 Hypertensive heart and chronic kidney disease with heart failure and with stage 5 chronic kidney disease, or end stage renal disease: Secondary | ICD-10-CM | POA: Diagnosis not present

## 2021-11-22 DIAGNOSIS — Z992 Dependence on renal dialysis: Secondary | ICD-10-CM | POA: Diagnosis not present

## 2021-11-22 DIAGNOSIS — S065X1D Traumatic subdural hemorrhage with loss of consciousness of 30 minutes or less, subsequent encounter: Secondary | ICD-10-CM | POA: Diagnosis not present

## 2021-11-22 DIAGNOSIS — N2581 Secondary hyperparathyroidism of renal origin: Secondary | ICD-10-CM | POA: Diagnosis not present

## 2021-11-22 DIAGNOSIS — R569 Unspecified convulsions: Secondary | ICD-10-CM | POA: Diagnosis not present

## 2021-11-22 DIAGNOSIS — T8612 Kidney transplant failure: Secondary | ICD-10-CM | POA: Diagnosis not present

## 2021-11-22 DIAGNOSIS — N186 End stage renal disease: Secondary | ICD-10-CM | POA: Diagnosis not present

## 2021-11-22 LAB — CULTURE, BLOOD (ROUTINE X 2): Special Requests: ADEQUATE

## 2021-11-22 LAB — GLUCOSE, CAPILLARY: Glucose-Capillary: 149 mg/dL — ABNORMAL HIGH (ref 70–99)

## 2021-11-22 NOTE — Evaluation (Signed)
Physical Therapy Assessment and Plan  Patient Details  Name: Kirk [name] MRN: [id number] Date of Birth: Apr 22, 1960  PT Diagnosis: Abnormal posture, Abnormality of gait, Ataxia, Ataxic gait, Coordination disorder, Impaired cognition, and Muscle weakness Rehab Potential: Good ELOS: 10-14 days   Today's Date: 11/22/2021 PT Individual Time: 0802-0910 PT Individual Time Calculation (min): 68 min    Hospital Problem: Principal Problem:   Traumatic subdural hematoma (SDH) ([place]) Active Problems:   Uncontrolled hypertension   ESRD on dialysis ([place])   Chronic systolic heart failure ([place])   Past Medical History:  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)    Anxiety    Asthma    Asthma    moderate persistent   Carpal tunnel syndrome    Cellulitis and abscess of trunk    Cholelithiasis 07/13/2014   Chronic headaches    Debility, unspecified    Dermatophytosis of the body    Dysrhythmia    history of   Edema    End stage renal disease on dialysis ([place])    "MWF; [facility] in [place]" (10/21/2014)   Essential hypertension, benign    GERD (gastroesophageal reflux disease)    Gout, unspecified    HTN (hypertension)    Hypertrophy of prostate without urinary obstruction and other lower urinary tract symptoms (LUTS)    Hypotension, unspecified    Impotence of organic origin    Insomnia, unspecified    Kidney replaced by transplant    Localization-related (focal) (partial) epilepsy and epileptic syndromes with complex partial seizures, without mention of intractable epilepsy    12-15-19- Wife states he has NEVER had a seizure    Lumbago    Memory loss    OSA on CPAP    Other and unspecified hyperlipidemia    controlled /managed per wife    Other chronic nonalcoholic liver disease    Other malaise and fatigue    Other  nonspecific abnormal serum enzyme levels    Pain in joint, lower leg    Pain in joint, upper arm    Pneumonia "several times"   Renal dialysis status(V45.11) 02/05/2010   restarted 01/02/13 ofter renal trransplant failure   Secondary hyperparathyroidism (of renal origin)    Shortness of breath    Sleep apnea    wears cpap    Tension headache    Unspecified constipation    Unspecified essential hypertension    Unspecified hereditary and idiopathic peripheral neuropathy    Unspecified vitamin D deficiency    Past Surgical History:  Past Surgical History:  Procedure Laterality Date   AV FISTULA PLACEMENT Left ?2010   "forearm; at [facility]"   [name]  03/21/2011   CHOLECYSTECTOMY N/A 10/21/2014   Procedure: LAPAROSCOPIC CHOLECYSTECTOMY WITH INTRAOPERATIVE CHOLANGIOGRAM;  Surgeon: Autumn Messing III, MD;  Location: [facility];  Service: General;  Laterality: N/A;   COLONOSCOPY     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   KIDNEY TRANSPLANT  08/17/2011   [name]  10/21/2014   w/IOC   LEFT HEART CATHETERIZATION WITH CORONARY ANGIOGRAM N/A 03/21/2011   Procedure: LEFT HEART CATHETERIZATION WITH CORONARY ANGIOGRAM;  Surgeon: [name], MD;  Location: [facility] CATH LAB;  Service: Cardiovascular;  Laterality: N/A;   NEPHRECTOMY  08/2013   removed transplaned kidney   POLYPECTOMY     POSTERIOR FUSION CERVICAL SPINE  06/25/2012   for spinal stenosis   VASECTOMY  2010    Assessment & Plan Clinical Impression: Patient is a 61 year old right-handed male with history of end-stage renal disease in setting of [place] syndrome status post nephrectomy, memory impairment, chronic diastolic congestive heart failure, obesity with BMI 32.49, hypertension, chronic thrombocytopenia, anxiety, chronic headaches, OSA on CPAP, quit smoking 20 months ago, chronic bilateral upper extremity lower extremity weakness with history of  posterior cervical fusion 07/11/2012 and 03/13/2021.  Per chart review patient lives with spouse and family.  1 level home 3 steps to entry.  Reportedly independent with history of frequent falls and was driving short distances.  Wife can provide assistance as needed.  Presented 11/07/2021 after reported seizure found by his wife lasting approximately 2 minutes.  There has been reports of headache for the past few days.  Admission chemistries unremarkable except glucose 149 BUN 40 creatinine 11.66, hemoglobin 10.1.  EEG no seizure or epileptiform discharge were seen.  Echocardiogram with ejection fraction of 50 to 55% no wall motion abnormalities grade 1 diastolic dysfunction.  Cranial CT scan showed acute subdural hematoma overlying the right frontal convexity measuring 9 mm in greatest radial diameter.  Mild adjacent mass effect and 1-2 mm of leftward midline shift at the septum Pellucidum.  Follow-up neurosurgery Dr. 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  Additional smaller subdural collections along the left cerebral convexity, along the right tentorial leaflet, and along the right inferior temporal lobe measuring up to 5 mm.  Hospital course follow-up nephrology services hemodialysis ongoing as indicated.  Maintained on Keppra for seizure prophylaxis.  Patient did spike a low-grade fever 100.910/24 concern for SIRS placed on vancomycin cefepime and Flagyl.  Lactic acid level was normal procalcitonin 0.58 WBC normal chest x-ray suggestive of interstitial edema rather than infectious infiltrate.  2 out of 4 blood cultures from 10/25 positive for staph epidermidis likely contaminant he did complete course of antibiotic therapy no further fever noted.  On the afternoon of 11/15/2021  return from dialysis noted to be tachycardic EKG atrial fibrillation with RVR and received a dose of Cardizem converting back to sinus rhythm.  It was felt atrial fibrillation likely provoked due to inability to take medications prior to dialysis.  He was not a candidate for anticoagulation due to hematoma.  Patient is tolerating a regular consistency diet.  Patient transferred to CIR on 11/21/2021 .   Patient currently requires mod with mobility secondary to muscle weakness, decreased cardiorespiratoy endurance, decreased coordination, decreased attention, decreased safety awareness, and delayed processing, and decreased sitting balance, decreased standing balance, decreased postural control, hemiplegia, and decreased balance strategies.  Prior to hospitalization, patient was modified independent  with mobility and lived with Spouse in a House home.  Home access is 10Stairs to enter.  Patient will benefit from skilled PT intervention to maximize safe functional mobility, minimize fall risk, and decrease caregiver burden for planned discharge home with 24 hour supervision.  Anticipate patient will benefit from follow up [place] at discharge.  PT - End of Session Endurance Deficit: Yes PT Assessment Rehab Potential (ACUTE/IP ONLY): Good PT Barriers to Discharge: Home environment access/layout;Hemodialysis;Medication compliance;Behavior PT Patient demonstrates impairments in the following  area(s): Balance;Behavior;Endurance;Motor;Pain;Perception;Safety;Sensory;Skin Integrity PT Transfers Functional Problem(s): Bed Mobility;Bed to Chair;Car;Furniture;Floor PT Locomotion Functional Problem(s): Ambulation;Stairs;Wheelchair Mobility PT Plan PT Intensity: Minimum of 1-2 x/day ,45 to 90 minutes PT Frequency: 5 out of 7 days PT Duration Estimated Length of Stay: 10-14 days PT Treatment/Interventions: Ambulation/gait training;Balance/vestibular training;Cognitive remediation/compensation;Disease  management/prevention;Discharge planning;Community reintegration;DME/adaptive equipment instruction;Functional electrical stimulation;Functional mobility training;Patient/family education;Pain management;Neuromuscular re-education;Psychosocial support;Skin care/wound management;Splinting/orthotics;Therapeutic Exercise;Therapeutic Activities;Stair training;UE/LE Strength taining/ROM;UE/LE Coordination activities;Visual/perceptual remediation/compensation;Wheelchair propulsion/positioning PT Transfers Anticipated Outcome(s): supervision assist PT Locomotion Anticipated Outcome(s): Supervision assist with LRAD PT Recommendation Recommendations for Other Services: Therapeutic Recreation consult;Neuropsych consult Therapeutic Recreation Interventions: Stress management;Outing/community reintergration Follow Up Recommendations: Home health PT Patient destination: Home Equipment Recommended: Rolling walker with 5" wheels;To be determined   PT Evaluation Precautions/Restrictions Precautions Precautions: Fall Restrictions Weight Bearing Restrictions: No General   Vital SignsTherapy Vitals Pulse Rate: 61 BP: (!) 197/66 Patient Position (if appropriate): Sitting Pain Pain Assessment Pain Scale: 0-10 Pain Score: 8  Pain Type: Acute pain Pain Location: Head Pain Orientation: Anterior;Posterior Pain Descriptors / Indicators: Aching Pain Frequency: Intermittent Pain Intervention(s): Medication (See eMAR) Pain Interference Pain Interference Pain Effect on Sleep: 4. Almost constantly Pain Interference with Therapy Activities: 2. Occasionally Pain Interference with Day-to-Day Activities: 1. Rarely or not at all Home Living/Prior [place]: Spouse/significant other Available Help at Discharge: Family;Available 24 hours/day Type of Home: House Home Access: Stairs to enter [employer] of Steps: 10 Entrance Stairs-Rails: Left Home Layout: One  level Bathroom Shower/Tub: Chiropodist: Standard Bathroom Accessibility: Yes Additional Comments: reports intermittent cance use  Lives With: Spouse Prior Function Level of Independence: Independent with basic ADLs;Independent with homemaking with ambulation (hx of frequent falls ove the last 6 months)  Able to Take Stairs?: Yes Driving: Yes Vocation: On disability Vision/Perception  Vision - Assessment Eye Alignment: Within Functional Limits Ocular Range of Motion: Within Functional Limits Tracking/Visual Pursuits: Decreased smoothness of eye movement to LEFT inferior field (reports mild dizziness) Saccades: Other (comment) (reports increased HA in occipital region) Convergence: Impaired (comment) Diplopia Assessment: Present in near gaze Praxis Praxis: Intact  Cognition Orientation Level: Oriented X4 Awareness: Impaired Problem Solving: Impaired Safety/Judgment: Impaired Comments: impuslive with poor awareness of balance deficits Sensation Sensation Light Touch: Appears Intact Hot/Cold: Appears Intact Proprioception: Appears Intact Stereognosis: Not tested Coordination Gross Motor Movements are Fluid and Coordinated: No Fine Motor Movements are Fluid and Coordinated: No Coordination and Movement Description: ataxia due to limited LE motor control Finger Nose Finger Test: functional Motor  Motor Motor: Ataxia Motor - Skilled Clinical Observations: mild coordination deficits in BLE and trunk   Trunk/Postural Assessment  Cervical Assessment Cervical Assessment: Within Functional Limits Thoracic Assessment Thoracic Assessment: Exceptions to [facility] (rounded shoulders) Lumbar Assessment Lumbar Assessment: Exceptions to [facility] Postural Control Postural Control: Deficits on evaluation (mild trunkal ataxia)  Balance Balance Balance Assessed: Yes Static Sitting Balance Static Sitting - Level of Assistance: 5: Stand by assistance Dynamic Sitting  Balance Dynamic Sitting - Level of Assistance: 5: Stand by assistance;4: Min assist Dynamic Sitting Balance - Compensations: able to don shoes with supervision assist. required min assist following possible seizure Static Standing Balance Static Standing - Balance Support: No upper extremity supported Static Standing - Level of Assistance: 4: Min assist Dynamic Standing Balance Dynamic Standing - Balance Support: No upper extremity supported;During functional activity Dynamic Standing - Level of Assistance: 3: Mod assist;4: Min assist Extremity Assessment      RLE Assessment RLE Assessment: Exceptions to [facility] RLE Strength Right Hip Flexion:  4-/5 Right Hip Extension: 4-/5 Right Hip ABduction: 4/5 Right Hip ADduction: 4/5 Right Knee Flexion: 4-/5 Right Knee Extension: 4-/5 Right Ankle Dorsiflexion: 4/5 LLE Assessment LLE Assessment: Exceptions to [facility] LLE Strength Left Hip Flexion: 4-/5 Left Hip Extension: 4-/5 Left Hip ABduction: 4/5 Left Hip ADduction: 4/5 Left Knee Flexion: 4/5 Left Knee Extension: 4/5 Left Ankle Dorsiflexion: 4/5  Care Tool Care Tool Bed Mobility Roll left and right activity   Roll left and right assist level: Supervision/Verbal cueing    Sit to lying activity   Sit to lying assist level: Minimal Assistance - Patient > 75%    Lying to sitting on side of bed activity   Lying to sitting on side of bed assist level: the ability to move from lying on the back to sitting on the side of the bed with no back support.: Supervision/Verbal cueing     Care Tool Transfers Sit to stand transfer   Sit to stand assist level: Moderate Assistance - Patient 50 - 74%    Chair/bed transfer   Chair/bed transfer assist level: Moderate Assistance - Patient 50 - 74%     Psychologist, counselling transfer activity did not occur: Safety/medical concerns        Care Tool Locomotion Ambulation   Assist level: Moderate Assistance - Patient 50 -  74% Assistive device: Hand held assist Max distance: 55  Walk 10 feet activity   Assist level: Moderate Assistance - Patient - 50 - 74% Assistive device: Hand held assist   Walk 50 feet with 2 turns activity   Assist level: Moderate Assistance - Patient - 50 - 74%    Walk 150 feet activity Walk 150 feet activity did not occur: Safety/medical concerns      Walk 10 feet on uneven surfaces activity Walk 10 feet on uneven surfaces activity did not occur: Safety/medical concerns      Stairs Stair activity did not occur: Safety/medical concerns        Walk up/down 1 step activity Walk up/down 1 step or curb (drop down) activity did not occur: Safety/medical concerns      Walk up/down 4 steps activity Walk up/down 4 steps activity did not occur: Safety/medical concerns      Walk up/down 12 steps activity Walk up/down 12 steps activity did not occur: Safety/medical concerns      Pick up small objects from floor Pick up small object from the floor (from standing position) activity did not occur: Safety/medical concerns      Wheelchair Is the patient using a wheelchair?: Yes Type of Wheelchair: Manual   Wheelchair assist level: Minimal Assistance - Patient > 75% Max wheelchair distance: 50  Wheel 50 feet with 2 turns activity   Assist Level: Minimal Assistance - Patient > 75%  Wheel 150 feet activity   Assist Level: Maximal Assistance - Patient 25 - 49%    Refer to Care Plan for Long Term Goals  SHORT TERM GOAL WEEK 1 PT Short Term Goal 1 (Week 1): Pt will transfer to [facility] with CGA PT Short Term Goal 2 (Week 1): Pt will ambulate 166f with CGA PT Short Term Goal 3 (Week 1): Pt will propell WC >100 with supervision assist PT Short Term Goal 4 (Week 1): Pt will tolerate >2 hours out of bed between therapies  Recommendations for other services: Therapeutic Recreation  Stress management and Outing/community reintegration  Skilled Therapeutic Intervention Mobility Bed  Mobility Bed Mobility:  Rolling Right;Rolling Left;Supine to Sit;Sit to Supine Rolling Right: Supervision/verbal cueing Rolling Left: Supervision/Verbal cueing Supine to Sit: Supervision/Verbal cueing Sit to Supine: Minimal Assistance - Patient > 75% Transfers Transfers: Sit to Stand;Stand Pivot Transfers Sit to Stand: Moderate Assistance - Patient 50-74% Stand Pivot Transfers: Moderate Assistance - Patient 50 - 74% Stand Pivot Transfer Details: Verbal cues for precautions/safety;Manual facilitation for weight shifting Stand Pivot Transfer Details (indicate cue type and reason): posterior bias causing LOB Locomotion  Gait Ambulation: Yes Gait Assistance: Moderate Assistance - Patient 50-74%;Minimal Assistance - Patient > 75% Gait Distance (Feet): 55 Feet Assistive device: None Gait Gait: Yes Gait Pattern: Impaired Gait Pattern: Step-through pattern;Lateral hip instability;Ataxic;Decreased step length - left;Decreased stride length Stairs / Additional Locomotion Stairs: No Architect: Yes Wheelchair Assistance: Minimal assistance - Patient >75% Wheelchair Propulsion: Both upper extremities Wheelchair Parts Management: Needs assistance Distance: 50  Pt received supine in bed and agreeable to PT. Supine>sit transfer with supervision assist and cues for safety to prevent posterior bias initially sitting EOB. Pt donned shoes sitting EOB with supervision assist from PT. Stand pivot transfer to [facility] with mod assist due to heavy posterior bias. Pt transported to rehab gym in [facility]. PT instructed patient in PT Evaluation and initiated treatment intervention; see above for results. PT educated patient in [place], rehab potential, rehab goals, and discharge recommendations along with recommendation for follow-up rehabilitation services.   Gait training with no AD and mod assist as listed above due to trunkal and BLE coordination deficits and 1 instance of BLE knee buckling.  Pt returned to [facility] and noted to have mild seizure episode sitting in WC with LOC, lateral lean to the R and RUE tremor lasting about 30 sec with addition 30 sec of surring of speech and mild disorientation. BP assessed 191/79 as stated above. Pt returned to room. Stand pivot transfer to bed with mod assist due to posterior bias. Min assist to return to supine. Once in supine Pt noted to have addition siezure like activity with RUE tremor and mild LOC. RN and MD notified. Pt left supine in bed with call 4 rails up per pt request.     Discharge Criteria: Patient will be discharged from PT if patient refuses treatment 3 consecutive times without medical reason, if treatment goals not met, if there is a change in medical status, if patient makes no progress towards goals or if patient is discharged from hospital.  The above assessment, treatment plan, treatment alternatives and goals were discussed and mutually agreed upon: by patient  Lorie Phenix 11/22/2021, 11:04 AM

## 2021-11-22 NOTE — Progress Notes (Addendum)
Dr. Melvia Heaps notified of BP during his rounds; patient is going to HD today. New orders noted.BP 197/66

## 2021-11-22 NOTE — Progress Notes (Signed)
Patient is refusing to use the bedside commode or a walker, he walked to the bathroom by himself, refusing assistance.

## 2021-11-22 NOTE — Progress Notes (Signed)
Nephrology Progress Note  Subjective:  seen in room. No c/o's.    Objective Vital signs in last 24 hours: Vitals:   11/22/21 0835 11/22/21 0857 11/22/21 1003 11/22/21 1313  BP: (!) 173/81 (!) 191/79 (!) 197/66 (!) 194/76  Pulse: 61 73 61 64  Resp:    19  Temp:    98.1 F (36.7 C)  TempSrc:    Oral  SpO2:    100%  Weight:      Height:       Weight change:   Intake/Output Summary (Last 24 hours) at 11/22/2021 1332 Last data filed at 11/22/2021 1300 Gross per 24 hour  Intake 1082 ml  Output --  Net 1082 ml    Physical Exam:      General adult male in bed in NAD Lungs clear to auscultation bilaterally  Heart S1S2 no rub Abdomen soft nontender nondistended Extremities no edema  Neuro - Interactive and conversant; follows commands Access LUE AVF bruit and thrill    OP HD:  [facility] MWF  4h 34mn 450/1.5   89.9kg  2/2 bath  P4 LUA AVF  Hep NONE (was on hep 2500 prior to admit) - sensipar 60 mg qHD - calcitriol 1.25 mcg PO qHD   Assessment/Plan: New onset seizure - due to 2/2 right SDH per neurology. Neurology following. On Keppar. Seizure/fall precautions.   Subdural hematoma - neurosurgery recommended conservative management with outpatient follow-up with them in clinic ESRD -  On dialysis MWF. HD today. NO heparin (due to IC bleed).  HTN  - on 5 BP lowering meds including norvasc, losartan, cardura, metoprolol and hydralazine. BP's still high. Vol up. Get vol down.  Volume - remains up by wts, large UF goal w/ HD today Anemia of CKD - previously without indication for ESA now with subdural hematoma and uncontrolled HTN. Resume ESA once BP under control  Secondary Hyperparathyroidism - on calcitriol, renvela ac tid  OSA on CPAP - CPAP at night Chronic thrombocytopenia  - stable Afib with RVR - rapid afib s/p dilt on 10/25. Now off.  On beta blocker   RKelly Splinter MD 11/22/2021, 1:32 PM  Recent Labs  Lab 11/17/21 1517 11/19/21 0326 11/21/21 0321  HGB  --  8.3* 8.3*   ALBUMIN 2.9* 3.1*  --   CALCIUM 8.8* 9.1  --   PHOS 3.6 5.7*  --   CREATININE 5.60* 10.51*  --   K 3.2* 3.9  --      Inpatient medications:  amLODipine  10 mg Oral QPM   calcitRIOL  1 mcg Oral Q M,W,F-HD   cholecalciferol  4,000 Units Oral Daily   doxazosin  4 mg Oral Daily   guaiFENesin  1,200 mg Oral BID   hydrALAZINE  100 mg Oral Q8H   isosorbide mononitrate  60 mg Oral Daily   levETIRAcetam  250 mg Oral Q M,W,F-1800   levETIRAcetam  500 mg Oral Daily   losartan  100 mg Oral QPM   methocarbamol  750 mg Oral QID   metoprolol tartrate  100 mg Oral BID   multivitamin  1 tablet Oral Daily   pantoprazole  40 mg Oral BID AC   pravastatin  40 mg Oral Daily   pregabalin  50 mg Oral Daily   senna-docusate  1 tablet Oral BID   sevelamer carbonate  3,200 mg Oral TID WC   sucralfate  1 g Oral TID WC & HS   traZODone  50 mg Oral QHS  acetaminophen **OR** acetaminophen, bisacodyl, cloNIDine, nitroGLYCERIN, ondansetron **OR** ondansetron (ZOFRAN) IV, oxyCODONE, polyethylene glycol, sevelamer carbonate

## 2021-11-22 NOTE — Progress Notes (Addendum)
30s witnessed seizure x 2 this am during pt, pt with short LOC and RIght arm shaking Keppra dose just increased today , discussed with PT.  May need neuro f/u if increased dose ineffective   Discussed with Neuro via secure chat, Dr Cheral Marker, will check Keppra level today, may need to adjust dose.

## 2021-11-22 NOTE — Plan of Care (Signed)
  Problem: RH Balance Goal: LTG Patient will maintain dynamic standing with ADLs (OT) Description: LTG:  Patient will maintain dynamic standing balance with assist during activities of daily living (OT)  Flowsheets (Taken 11/22/2021 1710) LTG: Pt will maintain dynamic standing balance during ADLs with: Independent with assistive device   Problem: RH Bathing Goal: LTG Patient will bathe all body parts with assist levels (OT) Description: LTG: Patient will bathe all body parts with assist levels (OT) Flowsheets (Taken 11/22/2021 1710) LTG: Pt will perform bathing with assistance level/cueing: Set up assist    Problem: RH Dressing Goal: LTG Patient will perform upper body dressing (OT) Description: LTG Patient will perform upper body dressing with assist, with/without cues (OT). Flowsheets (Taken 11/22/2021 1710) LTG: Pt will perform upper body dressing with assistance level of: Independent Goal: LTG Patient will perform lower body dressing w/assist (OT) Description: LTG: Patient will perform lower body dressing with assist, with/without cues in positioning using equipment (OT) Flowsheets (Taken 11/22/2021 1710) LTG: Pt will perform lower body dressing with assistance level of: Independent with assistive device   Problem: RH Toileting Goal: LTG Patient will perform toileting task (3/3 steps) with assistance level (OT) Description: LTG: Patient will perform toileting task (3/3 steps) with assistance level (OT)  Flowsheets (Taken 11/22/2021 1710) LTG: Pt will perform toileting task (3/3 steps) with assistance level: Independent with assistive device   Problem: RH Toilet Transfers Goal: LTG Patient will perform toilet transfers w/assist (OT) Description: LTG: Patient will perform toilet transfers with assist, with/without cues using equipment (OT) Flowsheets (Taken 11/22/2021 1710) LTG: Pt will perform toilet transfers with assistance level of: Supervision/Verbal cueing   Problem: RH  Awareness Goal: LTG: Patient will demonstrate awareness during functional activites type of (OT) Description: LTG: Patient will demonstrate awareness during functional activites type of (OT) Flowsheets (Taken 11/22/2021 1710) Patient will demonstrate awareness during functional activites type of: Emergent LTG: Patient will demonstrate awareness during functional activites type of (OT): Supervision

## 2021-11-22 NOTE — Discharge Instructions (Signed)
Inpatient Rehab Discharge Instructions  DAVY [facility] Discharge date and time: No discharge date for patient encounter.   Activities/Precautions/ Functional Status: Activity: activity as tolerated Diet: renal diet Wound Care: Routine skin checks Functional status:  ___ No restrictions     ___ Walk up steps independently ___ 24/7 supervision/assistance   ___ Walk up steps with assistance ___ Intermittent supervision/assistance  ___ Bathe/dress independently ___ Walk with walker     _x__ Bathe/dress with assistance ___ Walk Independently    ___ Shower independently ___ Walk with assistance    ___ Shower with assistance ___ No alcohol     ___ Return to work/school ________  Special Instructions: No driving smoking or alcohol  Continue hemodialysis as per renal services   My questions have been answered and I understand these instructions. I will adhere to these goals and the provided educational materials after my discharge from the hospital.  Patient/Caregiver Signature _______________________________ Date __________  Clinician Signature _______________________________________ Date __________  Please bring this form and your medication list with you to all your follow-up doctor's appointments.

## 2021-11-22 NOTE — Progress Notes (Signed)
[facility] Individual Statement of Services  Patient Name:  Kirk [name]  Date:  11/22/2021  Welcome to the [facility].  Our goal is to provide you with an individualized program based on your diagnosis and situation, designed to meet your specific needs.  With this comprehensive rehabilitation program, you will be expected to participate in at least 3 hours of rehabilitation therapies Monday-Friday, with modified therapy programming on the weekends.  Your rehabilitation program will include the following services:  Physical Therapy (PT), Occupational Therapy (OT), Speech Therapy (ST), 24 hour per day rehabilitation nursing, Therapeutic Recreaction (TR), Neuropsychology, Care Coordinator, Rehabilitation Medicine, Nutrition Services, Pharmacy Services, and Other  Weekly team conferences will be held on Wednesdays to discuss your progress.  Your Inpatient Rehabilitation Care Coordinator will talk with you frequently to get your input and to update you on team discussions.  Team conferences with you and your family in attendance may also be held.  Expected length of stay: 12-14 Days  Overall anticipated outcome:  MIN A  Depending on your progress and recovery, your program may change. Your Inpatient Rehabilitation Care Coordinator will coordinate services and will keep you informed of any changes. Your Inpatient Rehabilitation Care Coordinator's name and contact numbers are listed  below.  The following services may also be recommended but are not provided by the [facility]:   [place] will be made to provide these services after discharge if needed.  Arrangements include referral to agencies that provide these services.  Your insurance has been verified to be:   [place] primary doctor is:  Ngetich, Buyer, retail, NP  Pertinent information will be  shared with your doctor and your insurance company.  Inpatient Rehabilitation Care Coordinator:  Erlene Quan, [place] or [phone]  Information discussed with and copy given to patient by: Dyanne Iha, 11/22/2021, 9:15 AM

## 2021-11-22 NOTE — Progress Notes (Signed)
Inpatient Rehabilitation Care Coordinator Assessment and Plan Patient Details  Name: Kirk [name] MRN: [id number] Date of Birth: July 16, 1960  Today's Date: 11/22/2021  Hospital Problems: Principal Problem:   Traumatic subdural hematoma (SDH) ([place]) Active Problems:   Uncontrolled hypertension   ESRD on dialysis ([place])   Chronic systolic heart failure ([place])  Past Medical History:  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)    Anxiety    Asthma    Asthma    moderate persistent   Carpal tunnel syndrome    Cellulitis and abscess of trunk    Cholelithiasis 07/13/2014   Chronic headaches    Debility, unspecified    Dermatophytosis of the body    Dysrhythmia    history of   Edema    End stage renal disease on dialysis ([place])    "MWF; [facility] in [place]" (10/21/2014)   Essential hypertension, benign    GERD (gastroesophageal reflux disease)    Gout, unspecified    HTN (hypertension)    Hypertrophy of prostate without urinary obstruction and other lower urinary tract symptoms (LUTS)    Hypotension, unspecified    Impotence of organic origin    Insomnia, unspecified    Kidney replaced by transplant    Localization-related (focal) (partial) epilepsy and epileptic syndromes with complex partial seizures, without mention of intractable epilepsy    12-15-19- Wife states he has NEVER had a seizure    Lumbago    Memory loss    OSA on CPAP    Other and unspecified hyperlipidemia    controlled /managed per wife    Other chronic nonalcoholic liver disease    Other malaise and fatigue    Other nonspecific abnormal serum enzyme levels    Pain in joint, lower leg    Pain in joint, upper arm    Pneumonia "several times"   Renal dialysis status(V45.11) 02/05/2010   restarted 01/02/13 ofter renal trransplant failure   Secondary  hyperparathyroidism (of renal origin)    Shortness of breath    Sleep apnea    wears cpap    Tension headache    Unspecified constipation    Unspecified essential hypertension    Unspecified hereditary and idiopathic peripheral neuropathy    Unspecified vitamin D deficiency    Past Surgical History:  Past Surgical History:  Procedure Laterality Date   AV FISTULA PLACEMENT Left ?2010   "forearm; at [facility]"   [place]  03/21/2011   CHOLECYSTECTOMY N/A 10/21/2014   Procedure: LAPAROSCOPIC CHOLECYSTECTOMY WITH INTRAOPERATIVE CHOLANGIOGRAM;  Surgeon: Autumn Messing III, MD;  Location: [facility];  Service: General;  Laterality: N/A;   COLONOSCOPY     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  10/21/2014   w/IOC   LEFT HEART CATHETERIZATION WITH CORONARY ANGIOGRAM N/A 03/21/2011   Procedure: LEFT HEART CATHETERIZATION WITH CORONARY ANGIOGRAM;  Surgeon: [name], MD;  Location[facility] CATH LAB;  Service: Cardiovascular;  Laterality: N/A;   NEPHRECTOMY  08/2013   removed transplaned kidney   POLYPECTOMY     POSTERIOR FUSION CERVICAL SPINE  06/25/2012   for spinal stenosis   VASECTOMY  2010   Social History:  reports that he quit  smoking about 20 months ago. His smoking use included cigarettes. He started smoking about 30 years ago. He has a 16.00 pack-year smoking history. He has never used smokeless tobacco. He reports that he does not drink alcohol and does not use drugs.  Family / Support Systems Spouse/Significant Other: Marcene Corning Anticipated Caregiver: [name] (spouse) Ability/Limitations of Caregiver: MIN A Caregiver Availability: 24/7 Family Dynamics: support from spouse  Social History Preferred language: English Religion: Non-Denominational Health Literacy - How often do you need to have someone help you when you read instructions, pamphlets, or  other written material from your doctor or pharmacy?: Never Writes: Yes   Abuse/Neglect Abuse/Neglect Assessment Can Be Completed: Yes Physical Abuse: Denies Verbal Abuse: Denies Sexual Abuse: Denies Exploitation of patient/patient's resources: Denies Self-Neglect: Denies  Patient response to: Social Isolation - How often do you feel lonely or isolated from those around you?: Rarely  Emotional Status Psychiatric History: anxiety Substance Abuse History: quit smoking 20 months ago  Patient / Family Perceptions, Expectations & Goals Pt/Family understanding of illness & functional limitations: yes Premorbid pt/family roles/activities: Independent and driving. Falling freq. Anticipated changes in roles/activities/participation: spouse able to assist 24/7 at [facility] A level Pt/family expectations/goals: MIN [place] Agencies: None Premorbid Home Care/DME Agencies: Other (Comment) ([facility]) Transportation available at discharge: spouse able to transport Is the patient able to respond to transportation needs?: Yes In the past 12 months, has lack of transportation kept you from medical appointments or from getting medications?: No In the past 12 months, has lack of transportation kept you from meetings, work, or from getting things needed for daily living?: No Resource referrals recommended: Neuropsychology  Discharge Planning Living Arrangements: Spouse/significant other Support Systems: Spouse/significant other Type of Residence: Private residence (1 level home, 3 steps) Insurance Resources: Chartered certified accountant Resources: Family Support Financial Screen Referred: No Living Expenses: Own Money Management: Spouse, Patient Does the patient have any problems obtaining your medications?: No Home Management: Independent Patient/Family Preliminary Plans: Spouse able to assist as needed Care Coordinator Barriers to Discharge: Hemodialysis, Lack of/limited family support,  Decreased caregiver support, Inaccessible home environment, Home environment access/layout Care Coordinator Barriers to Discharge Comments: HD: MWF [place]. Care Coordinator Anticipated Follow Up Needs: HH/OP DC Planning Additional Notes/Comments: Patient plumbing is bad in home and bathroom not functional. Patient reports using a nearby store bathroom or BSC Expected length of stay: 12-14 Days  Clinical Impression Sw met with patient, introduced self, explained role and addressed questions or concerns. Sw informed patient of his conference days and let patient know that we will determine a date for discharge on next Wednesday, no additional questions or concerns.   Dyanne Iha 11/22/2021, 1:19 PM

## 2021-11-22 NOTE — Progress Notes (Signed)
RN was called to room by PT re: pt had 2 episodes of seizure activity during his PT session. One after walking and fell back to wheelchair with right arm shaking and another one after laying on bed with speech becoming slurred for a moment and right arm shaking. Vital signs was done on both episodes. Patient had his seizure med before therapy. Dan PA notified. Continued to monitor.

## 2021-11-22 NOTE — Evaluation (Signed)
Occupational Therapy Assessment and Plan  Patient Details  Name: Kirk [name] MRN: [id number] Date of Birth: 1960-10-23  OT Diagnosis: ataxia, cognitive deficits, and muscle weakness (generalized) Rehab Potential: Rehab Potential (ACUTE ONLY): Good ELOS: 12- 14 days   Today's Date: 11/22/2021 OT Individual Time: 1030-1130 OT Individual Time Calculation (min): 60 min     Hospital Problem: Principal Problem:   Traumatic subdural hematoma (SDH) ([place]) Active Problems:   Uncontrolled hypertension   ESRD on dialysis ([place])   Chronic systolic heart failure ([facility])   Past Medical History:  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)    Anxiety    Asthma    Asthma    moderate persistent   Carpal tunnel syndrome    Cellulitis and abscess of trunk    Cholelithiasis 07/13/2014   Chronic headaches    Debility, unspecified    Dermatophytosis of the body    Dysrhythmia    history of   Edema    End stage renal disease on dialysis ([place])    "MWF; [facility] in [place]" (10/21/2014)   Essential hypertension, benign    GERD (gastroesophageal reflux disease)    Gout, unspecified    HTN (hypertension)    Hypertrophy of prostate without urinary obstruction and other lower urinary tract symptoms (LUTS)    Hypotension, unspecified    Impotence of organic origin    Insomnia, unspecified    Kidney replaced by transplant    Localization-related (focal) (partial) epilepsy and epileptic syndromes with complex partial seizures, without mention of intractable epilepsy    12-15-19- Wife states he has NEVER had a seizure    Lumbago    Memory loss    OSA on CPAP    Other and unspecified hyperlipidemia    controlled /managed per wife    Other chronic nonalcoholic liver disease    Other malaise and fatigue    Other nonspecific abnormal serum  enzyme levels    Pain in joint, lower leg    Pain in joint, upper arm    Pneumonia "several times"   Renal dialysis status(V45.11) 02/05/2010   restarted 01/02/13 ofter renal trransplant failure   Secondary hyperparathyroidism (of renal origin)    Shortness of breath    Sleep apnea    wears cpap    Tension headache    Unspecified constipation    Unspecified essential hypertension    Unspecified hereditary and idiopathic peripheral neuropathy    Unspecified vitamin D deficiency    Past Surgical History:  Past Surgical History:  Procedure Laterality Date   AV FISTULA PLACEMENT Left ?2010   "forearm; at [facility]"   [place]  03/21/2011   CHOLECYSTECTOMY N/A 10/21/2014   Procedure: LAPAROSCOPIC CHOLECYSTECTOMY WITH INTRAOPERATIVE CHOLANGIOGRAM;  Surgeon: Autumn Messing III, MD;  Location: [facility];  Service: General;  Laterality: N/A;   COLONOSCOPY     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   KIDNEY TRANSPLANT  08/17/2011   [place]  10/21/2014   w/IOC   LEFT HEART CATHETERIZATION WITH CORONARY ANGIOGRAM N/A 03/21/2011   Procedure: LEFT HEART CATHETERIZATION WITH CORONARY ANGIOGRAM;  Surgeon: [name], MD;  Location: [facility] CATH LAB;  Service: Cardiovascular;  Laterality: N/A;   NEPHRECTOMY  08/2013  removed transplaned kidney   POLYPECTOMY     POSTERIOR FUSION CERVICAL SPINE  06/25/2012   for spinal stenosis   VASECTOMY  2010    Assessment & Plan Clinical Impression:  Kirk [name] is a 61 year old right-handed male with history of 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  Per chart review patient lives with spouse and family.  1 level home 3 steps to entry.  Reportedly independent with history of frequent falls and was driving short distances.  Wife can provide assistance as needed.  Presented 11/07/2021 after reported seizure found by his wife lasting approximately 2 minutes.  There has been reports of headache for the past few days.  Admission chemistries unremarkable except glucose 149 BUN 40 creatinine 11.66, hemoglobin 10.1.  EEG no seizure or epileptiform discharge were seen.  Echocardiogram with ejection fraction of 50 to 55% no wall motion abnormalities grade 1 diastolic dysfunction.  Cranial CT scan showed acute subdural hematoma overlying the right frontal convexity measuring 9 mm in greatest radial diameter.  Mild adjacent mass effect and 1-2 mm of leftward midline shift at the septum Pellucidum.  Follow-up neurosurgery Dr. 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  Additional smaller subdural collections along the left cerebral convexity, along the right tentorial leaflet, and along the right inferior temporal lobe measuring up to 5 mm.  Hospital course follow-up nephrology services hemodialysis ongoing as indicated.  Maintained on Keppra for seizure prophylaxis.  Patient did spike a low-grade fever 100.910/24 concern for SIRS placed on vancomycin cefepime and Flagyl.  Lactic acid level was normal procalcitonin 0.58 WBC normal chest x-ray suggestive of interstitial edema rather than infectious infiltrate.  2 out of 4 blood cultures from 10/25 positive for staph epidermidis likely contaminant he did complete course of antibiotic therapy no further fever noted.  On the afternoon of 11/15/2021 return from  dialysis noted to be tachycardic EKG atrial fibrillation with RVR and received a dose of Cardizem converting back to sinus rhythm.  It was felt atrial fibrillation likely provoked due to inability to take medications prior to dialysis.  He was not a candidate for anticoagulation due to hematoma.  Patient is tolerating a regular consistency diet.  Therapy evaluations completed due to patient decreased functional mobility was admitted for a comprehensive rehab program.   Patient transferred to CIR on 11/21/2021 .    Patient currently requires supervision with basic self-care skills and min A with transfers and ambulation to bathroom secondary to muscle weakness, decreased cardiorespiratoy endurance, unbalanced muscle activation and decreased coordination, decreased problem solving and decreased memory, and decreased standing balance, decreased postural control, and decreased balance strategies.  Prior to hospitalization, patient could self care with mod I.   Patient will benefit from skilled intervention to increase independence with basic self-care skills prior to discharge home with care partner.  Anticipate patient will require intermittent supervision and follow up home health.  OT - End of Session Activity Tolerance: Tolerates 10 - 20 min activity with multiple rests Endurance Deficit: Yes OT Assessment Rehab  Potential (ACUTE ONLY): Good OT Patient demonstrates impairments in the following area(s): Balance;Cognition;Endurance;Motor OT Basic ADL's Functional Problem(s): Bathing;Dressing;Toileting OT Transfers Functional Problem(s): Other (comment) (BSC) OT Additional Impairment(s): None OT Plan OT Intensity: Minimum of 1-2 x/day, 45 to 90 minutes OT Frequency: 5 out of 7 days OT Duration/Estimated Length of Stay: 12- 14 days   OT Evaluation Precautions/Restrictions  Precautions Precautions: Fall Precaution Comments: watch vitals, B knee buckling Restrictions Weight Bearing Restrictions:  No    Vital Signs Therapy Vitals Pulse Rate: 61 BP: (Abnormal) 197/66 Patient Position (if appropriate): Sitting Pain Pain Assessment Pain Scale: 0-10 Pain Score: 8  Pain Type: Acute pain Pain Location: Head Pain Descriptors / Indicators: Aching Pain Frequency: Intermittent Pain Intervention(s): Medication (See eMAR) Home Living/Prior Functioning Home Living Family/patient expects to be discharged to:: Private residence Living Arrangements: Spouse/significant other Available Help at Discharge: Family, Available 24 hours/day Type of Home: House Home Access: Stairs to enter Technical brewer of Steps: 10 Entrance Stairs-Rails: Left Home Layout: One level Bathroom Shower/Tub: Chiropodist: Standard Bathroom Accessibility: Yes Additional Comments: reports intermittent cance use  Lives With: Spouse Prior Function Level of Independence: Independent with basic ADLs, Independent with homemaking with ambulation (hx of frequent falls ove the last 6 months)  Able to Take Stairs?: Yes Driving: Yes Vocation: On disability Vision Baseline Vision/History: 1 Wears glasses Vision Assessment?: Yes Eye Alignment: Within Functional Limits Ocular Range of Motion: Within Functional Limits Tracking/Visual Pursuits: Decreased smoothness of eye movement to LEFT inferior field (reports mild dizziness) Saccades: Other (comment) (reports increased HA in occipital region) Convergence: Impaired (comment) Visual Fields: Right visual field deficit Diplopia Assessment: Present in near gaze Perception  Perception: Within Functional Limits Praxis Praxis: Intact Cognition Cognition Overall Cognitive Status: Impaired/Different from baseline Arousal/Alertness: Awake/alert Orientation Level: Person;Place;Situation Person: Oriented Place: Oriented Situation: Oriented Memory: Impaired Memory Impairment: Storage deficit;Decreased long term memory;Decreased short term  memory;Decreased recall of new information Awareness: Impaired Problem Solving: Impaired Safety/Judgment: Impaired Comments: impuslive with poor awareness of balance deficits Brief Interview for Mental Status (BIMS) Repetition of Three Words (First Attempt): 3 Temporal Orientation: Year: Correct Temporal Orientation: Month: Accurate within 5 days Temporal Orientation: Day: Correct Recall: "Sock": No, could not recall Recall: "Blue": Yes, no cue required Recall: "Bed": Yes, no cue required BIMS Summary Score: 13 Sensation Sensation Light Touch: Appears Intact Hot/Cold: Appears Intact Proprioception: Appears Intact Stereognosis: Not tested Coordination Gross Motor Movements are Fluid and Coordinated: No Fine Motor Movements are Fluid and Coordinated: No Coordination and Movement Description: ataxia due to limited LE motor control Finger Nose Finger Test: functional Motor  Motor Motor: Ataxia Motor - Skilled Clinical Observations: mild coordination deficits in BLE and trunk  Trunk/Postural Assessment  Cervical Assessment Cervical Assessment: Within Functional Limits Thoracic Assessment Thoracic Assessment: Exceptions to [facility] (rounded shoulders) Lumbar Assessment Lumbar Assessment: Exceptions to [facility] Postural Control Postural Control: Deficits on evaluation (mild trunkal ataxia)  Balance Balance Balance Assessed: Yes Static Sitting Balance Static Sitting - Level of Assistance: 5: Stand by assistance Dynamic Sitting Balance Dynamic Sitting - Level of Assistance: 5: Stand by assistance;4: Min assist Dynamic Sitting Balance - Compensations: able to don shoes with supervision assist. required min assist following possible seizure Static Standing Balance Static Standing - Balance Support: No upper extremity supported Static Standing - Level of Assistance: 4: Min assist Dynamic Standing Balance Dynamic Standing - Balance Support: No upper extremity supported;During functional  activity Dynamic Standing - Level of Assistance: 3: Mod assist;4: Min assist Extremity/Trunk Assessment RUE  Assessment Active Range of Motion (AROM) Comments: sh flexion to 30 degrees due to RTC injury General Strength Comments: elbow flex/ext 4-/5,  limited grasp strength,  trigger finger in 3 rd digit with pain with flexion and extension LUE Assessment Active Range of Motion (AROM) Comments: sh flexion to 85 degrees due to RTC injury General Strength Comments: elbow flex/ext 4-/5,  limited grasp strength,  trigger finger in 3 rd digit with pain with flexion and extension  Care Tool Care Tool Self Care Eating   Eating Assist Level: Set up assist    Oral Care    Oral Care Assist Level: Set up assist    Bathing   Body parts bathed by patient: Right arm;Left arm;Chest;Abdomen;Front perineal area;Buttocks;Right upper leg;Left upper leg;Face;Left lower leg;Right lower leg     Assist Level: Supervision/Verbal cueing    Upper Body Dressing(including orthotics)   What is the patient wearing?: Pull over shirt   Assist Level: Supervision/Verbal cueing    Lower Body Dressing (excluding footwear)   What is the patient wearing?: Underwear/pull up;Pants Assist for lower body dressing: Supervision/Verbal cueing    Putting on/Taking off footwear   What is the patient wearing?: Socks;Shoes Assist for footwear: Supervision/Verbal cueing       Care Tool Toileting Toileting activity   Assist for toileting: Supervision/Verbal cueing     Care Tool Bed Mobility Roll left and right activity   Roll left and right assist level: Supervision/Verbal cueing    Sit to lying activity   Sit to lying assist level: Minimal Assistance - Patient > 75%    Lying to sitting on side of bed activity   Lying to sitting on side of bed assist level: the ability to move from lying on the back to sitting on the side of the bed with no back support.: Supervision/Verbal cueing     Care Tool Transfers Sit to  stand transfer   Sit to stand assist level: Moderate Assistance - Patient 50 - 74%    Chair/bed transfer   Chair/bed transfer assist level: Moderate Assistance - Patient 50 - 74%     Toilet transfer   Assist Level: Minimal Assistance - Patient > 75%     Care Tool Cognition  Expression of Ideas and Wants Expression of Ideas and Wants: 4. Without difficulty (complex and basic) - expresses complex messages without difficulty and with speech that is clear and easy to understand  Understanding Verbal and Non-Verbal Content Understanding Verbal and Non-Verbal Content: 4. Understands (complex and basic) - clear comprehension without cues or repetitions   Memory/Recall Ability Memory/Recall Ability : Current season;That he or she is in a hospital/hospital unit   Refer to Care Plan for [place] 1    Recommendations for other services: Neuropsych   Skilled Therapeutic Intervention ADL  Close Supervision overall, min A transfers Mobility  Bed Mobility Bed Mobility: Rolling Right;Rolling Left;Supine to Sit;Sit to Supine Rolling Right: Supervision/verbal cueing Rolling Left: Supervision/Verbal cueing Supine to Sit: Supervision/Verbal cueing Sit to Supine: Minimal Assistance - Patient > 75% Transfers Sit to Stand: Moderate Assistance - Patient 50-74%   Pt seen for initial evaluation and assessment of ADL transfers.  Upon entering his room, pt stated he had an intense headache (was given medication).  Checked BP from supine 178/67, sat to EOB BP 197/74.  Pt reported that he had already washed up and dressed himself this am. He asked about showering this evening when his wife arrives.  Explained  that due to recent hx of  his knees buckling, falling posteriorly, and seizures this am, he needs to wait to trial with therapy first. Also with nursing staff, he should only do stand pivots to [facility] or wc to toilet. Due to elevated blood pressure, did conservative assessment  and treatment this session. Pt did demonstrated that he is S with bed mobility and sit to stand with RW.  He did ambulate to bathroom to toilet with min A but did demonstrate knee buckling (NT present for a +2 if needed).  Pt able to sit to stand from elevated seat with close S.   Pt has very limited shoulder ROM due to history of RTC and also trigger finger in B hands in 3rd digit, so UE exercises will need to be modified. Due to weak LEs had pt return to supine to engage in strengthening activities for hips and LEs.  Cued pt to do a small bridge but after 2 he c/o intense back pain. He stated he has been dealing with this back pain for some time.  He did work on hip flexion and knee ext with resistive red band 15x each leg for 3 sets and hip abduction.   Reviewed role of OT and discussed pt's goals. He stated he is used to be active and doing housework and yard work.  Pt resting in bed with all needs met and alarm set.   Discharge Criteria: Patient will be discharged from OT if patient refuses treatment 3 consecutive times without medical reason, if treatment goals not met, if there is a change in medical status, if patient makes no progress towards goals or if patient is discharged from hospital.  The above assessment, treatment plan, treatment alternatives and goals were discussed and mutually agreed upon: by patient  [facility] 11/22/2021, 12:44 PM

## 2021-11-22 NOTE — Progress Notes (Signed)
Inpatient Rehabilitation  Patient information reviewed and entered into eRehab system by Bradrick Kamau Courage Biglow, OTR/L, Rehab Quality Coordinator.   Information including medical coding, functional ability and quality indicators will be reviewed and updated through discharge.   
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
## 2021-11-22 NOTE — Progress Notes (Signed)
Physical Therapy Session Note  Patient Details  Name: Kirk [name] MRN: [id number] Date of Birth: December 25, 1960  Today's Date: 11/22/2021 PT Individual Time: 14:00- 1437 PT Individual Time Calculation (min): 37 min     Short Term Goals: Week 1:  PT Short Term Goal 1 (Week 1): Pt will transfer to [facility] with CGA PT Short Term Goal 2 (Week 1): Pt will ambulate 138f with CGA PT Short Term Goal 3 (Week 1): Pt will propell WC >100 with supervision assist PT Short Term Goal 4 (Week 1): Pt will tolerate >2 hours out of bed between therapies  Skilled Therapeutic Interventions/Progress Updates:  Pt supine with CPAP machine on and active on entrance. Pt asleep, but became alert after several attempts using verbal stimuli. Pt agreeable to Pt session but mentions that he has transportation scheduled for dialysis   treatment during therapy session. Confirmed with nurse that transport services will happen after treatment session.   Pt is extremely lethargic with occasional slurring of words and difficulty maintaining arousal.  Vitals: Pt blood pressure was checked at the start of session seated EOB (193/75), and standing at EOB (167/91) using RW with CGA. Vitals were rechecked after 15 minutes minutes seated at EOB (199/84) and supine 5 minutes shortly after (199/84).     Therapeutic Activity:  Transfers: Pt performed supine to sit at EOB with supervision assist and sit<> at EOB with CGA.   Postural Balance: Pt performed anterior leans (2x10) and lateral leans (2 x 8 ea) at EOB with CGA assist. Required frequent verbal commands to perform task.  Pt required consistent cuing and verbal commands to perform movements and maintain arousal. Pt had a difficult time maintaining attention and would occassionally doze off when asked to perform a task.  IV team entered room with request to take blood samples 35 minutes into treatment session.  Pt supine with CPAP machine on and active with all needs met.     Therapy Documentation Precautions:  Precautions Precautions: Fall Precaution Comments: watch vitals, B knee buckling Restrictions Weight Bearing Restrictions: No General:   Vital Signs: Therapy Vitals Temp: 98.1 F (36.7 C) Temp Source: Oral Pulse Rate: 64 Resp: 19 BP: (!) 194/76 Patient Position (if appropriate): Lying Oxygen Therapy SpO2: 100 % O2 Device: Room Air  Therapy/Group: Individual Therapy EHilary HertzPT, SPT  EHilary Hertz11/01/2021, 2:55 PM

## 2021-11-23 DIAGNOSIS — I132 Hypertensive heart and chronic kidney disease with heart failure and with stage 5 chronic kidney disease, or end stage renal disease: Secondary | ICD-10-CM | POA: Diagnosis not present

## 2021-11-23 DIAGNOSIS — Z992 Dependence on renal dialysis: Secondary | ICD-10-CM | POA: Diagnosis not present

## 2021-11-23 DIAGNOSIS — R569 Unspecified convulsions: Secondary | ICD-10-CM | POA: Diagnosis not present

## 2021-11-23 DIAGNOSIS — N2581 Secondary hyperparathyroidism of renal origin: Secondary | ICD-10-CM | POA: Diagnosis not present

## 2021-11-23 DIAGNOSIS — S065X1D Traumatic subdural hemorrhage with loss of consciousness of 30 minutes or less, subsequent encounter: Secondary | ICD-10-CM | POA: Diagnosis not present

## 2021-11-23 DIAGNOSIS — N186 End stage renal disease: Secondary | ICD-10-CM | POA: Diagnosis not present

## 2021-11-23 DIAGNOSIS — D631 Anemia in chronic kidney disease: Secondary | ICD-10-CM | POA: Diagnosis not present

## 2021-11-23 LAB — LEVETIRACETAM LEVEL: Levetiracetam Lvl: 11.7 ug/mL (ref 10.0–40.0)

## 2021-11-23 LAB — HEPATITIS B SURFACE ANTIGEN: Hepatitis B Surface Ag: NONREACTIVE

## 2021-11-23 MED ORDER — CHLORHEXIDINE GLUCONATE CLOTH 2 % EX PADS
6.0000 | MEDICATED_PAD | Freq: Every day | CUTANEOUS | Status: DC
  Administered 2021-11-23 – 2021-11-25 (×3): 6 via TOPICAL

## 2021-11-23 NOTE — Progress Notes (Signed)
Occupational Therapy Session Note  Patient Details  Name: SALBADOR FIVEASH MRN: [id number] Date of Birth: [birth year]  Today's Date: 11/23/2021 OT Individual Time: 5885-0277 OT Individual Time Calculation (min): 70 min    Short Term Goals: Week 1:  OT Short Term Goal 1 (Week 1): Pt will be able to ambulate to toilet with LRAD with light CGA. OT Short Term Goal 2 (Week 1): Pt will demonstrate improved standing balance with donning pants with close S with no posterior lean. OT Short Term Goal 3 (Week 1): Pt will learn gentle self ROM exercises he can do for his B shoulders.  Skilled Therapeutic Interventions/Progress Updates:  Pt greeted supine in bed, pt agreeable to OT intervention. Pt completed supine>sit with supervision. Pt declined need for ADLs, pt completed stand pivot from EOB>w/c with RW and CGA, total A transport to gym.   Assessed BPs in gym: BP from sitting 167/56 ( 89) HR 71 BP from standing 163/63 ( 91) HR 72   Pt completed dynamometer assessments to assess functional grip strength with results indicated below:  RUE- 43 lbs  LUE- 38 lbs   -Norms Males Average in lbs 55-59 R 101.1 L 83.2 60-64 R 89.7 L 76.8 65-69 R 91.1 L 76.8 70-74 R 75.3 L 64.8 75+ R 65.7 L 55.0  Issued pt Theraputty to work on functional grasp with an emphasis on composite flexion/extension, pincer grasp and intrinsic strength.   Pt completed 9 hole peg test to assess BUE coordination skills:  RUE 26 secs LUE 50 secs    - Norms for healthy males ages 59-70+ 98-55 R 18.9 L 19.84 56-60 R 20.90 L 21.64 61-65 R 20.87 L 21.60 66-70 R 21.23 L 22.29 71+ R 25.79 L 25.95  Pt completed standing balance task where pt instructed to reach out of BOS to retrieve suctions cup toys from mirror to simulate reaching into cabinets during IADLs. Pt completed task with CGA with no LOB.   Pt completed functional ambulation 2x100 ft with no AD and CGA with close chair follow, one LOB with pt noted to trip over  his R foot but able to recover with his own balance reactions. Pt reports he would do this at baseline. Pt reports 10 falls in the last 10 months.   Pt transported back to room in w/c with total A for energy conservation. Pt left seated in recliner, with alarm activated, BP from recliner-  173/59 ( 93) HR 69  Therapy Documentation Precautions:  Precautions Precautions: Fall Precaution Comments: watch vitals, B knee buckling Restrictions Weight Bearing Restrictions: No  Pain: 9/10 pain in back, neck, head and hands, rest breaks and distraction provided as needed.   Therapy/Group: Individual Therapy  Corinne Ports [facility] 11/23/2021, 12:22 PM

## 2021-11-23 NOTE — Progress Notes (Signed)
Pt stated he places self on CPAP, pt aware to have RT called if assistance needed.

## 2021-11-23 NOTE — Progress Notes (Addendum)
[facility] Progress Note   Subjective: Seen in gym for therapy. Says his BP is higher than usual but currently SBP 140s. No other C/Os.   Objective Vitals:   11/22/21 2011 11/22/21 2017 11/22/21 2112 11/23/21 0600  BP: (!) 175/62 (!) 157/69 (!) 194/72 (!) 152/50  Pulse: 73 74 81 73  Resp: '14 18 20 16  '$ Temp:   97.7 F (36.5 C) 98.8 F (37.1 C)  TempSrc:   Oral Oral  SpO2: 98% 99% 99% 99%  Weight:  94.3 kg    Height:       Physical Exam General: Chronically ill appearing male in NAD Heart: S1,S2 no rub Lungs: CTAB Abdomen: NABS, NT Extremities:No LE edema Dialysis Access: L AVF +T/B   Additional Objective Labs: Basic Metabolic Panel: Recent Labs  Lab 11/17/21 1517 11/19/21 0326  NA 134* 130*  K 3.2* 3.9  CL 92* 91*  CO2 30 25  GLUCOSE 123* 102*  BUN 18 32*  CREATININE 5.60* 10.51*  CALCIUM 8.8* 9.1  PHOS 3.6 5.7*   Liver Function Tests: Recent Labs  Lab 11/17/21 1517 11/19/21 0326  ALBUMIN 2.9* 3.1*   No results for input(s): "LIPASE", "AMYLASE" in the last 168 hours. CBC: Recent Labs  Lab 11/19/21 0326 11/21/21 0321  WBC 3.2* 4.7  HGB 8.3* 8.3*  HCT 24.5* 26.0*  MCV 90.7 94.2  PLT 74* 101*   Blood Culture    Component Value Date/Time   SDES BLOOD RIGHT HAND 11/16/2021 1126   SPECREQUEST  11/16/2021 1126    BOTTLES DRAWN AEROBIC AND ANAEROBIC Blood Culture adequate volume   CULT  11/16/2021 1126    NO GROWTH 5 DAYS Performed at [facility], [place] [address] [place], [place] [address]    REPTSTATUS 11/21/2021 FINAL 11/16/2021 1126    Cardiac Enzymes: No results for input(s): "CKTOTAL", "CKMB", "CKMBINDEX", "TROPONINI" in the last 168 hours. CBG: Recent Labs  Lab 11/21/21 0002 11/21/21 0330 11/21/21 0733 11/21/21 1102 11/21/21 1521  GLUCAP 149* 94 110* 103* 202*   Iron Studies: No results for input(s): "IRON", "TIBC", "TRANSFERRIN", "FERRITIN" in the last 72 hours. '@lablastinr3'$ @ Studies/Results: No results  found. Medications:   amLODipine  10 mg Oral QPM   calcitRIOL  1 mcg Oral Q M,W,F-HD   cholecalciferol  4,000 Units Oral Daily   doxazosin  4 mg Oral Daily   guaiFENesin  1,200 mg Oral BID   hydrALAZINE  100 mg Oral Q8H   isosorbide mononitrate  60 mg Oral Daily   levETIRAcetam  250 mg Oral Q M,W,F-1800   levETIRAcetam  500 mg Oral Daily   losartan  100 mg Oral QPM   methocarbamol  750 mg Oral QID   metoprolol tartrate  100 mg Oral BID   multivitamin  1 tablet Oral Daily   pantoprazole  40 mg Oral BID AC   pravastatin  40 mg Oral Daily   pregabalin  50 mg Oral Daily   senna-docusate  1 tablet Oral BID   sevelamer carbonate  3,200 mg Oral TID WC   sucralfate  1 g Oral TID WC & HS   traZODone  50 mg Oral QHS     OP HD:  [facility] MWF  4h 81mn 450/1.5   89.9kg  2/2 bath  P4 LUA AVF  Hep NONE (was on hep 2500 prior to admit) - sensipar 60 mg qHD - calcitriol 1.25 mcg PO qHD   Assessment/Plan: New onset seizure - due to 2/2 right SDH per  neurology. Neurology following. On Keppar. Seizure/fall precautions.   Subdural hematoma - neurosurgery recommended conservative management with outpatient follow-up with them in clinic ESRD -  On dialysis MWF. No heparin (due to IC bleed). Time decreased to 3.5 hours D/T scheduling.   HTN  - on 5 BP lowering meds including norvasc, losartan, cardura, metoprolol and hydralazine. BP's still high. Vol up. Get vol down. Discussed fluid restrictions. Even PT talking to him about his excessive fluid intake.  Volume - remains up by wts, large UF goal w/ HD today Anemia of CKD - previously without indication for ESA now with subdural hematoma and uncontrolled HTN. Resume ESA once BP under control  Secondary Hyperparathyroidism - on calcitriol, renvela ac tid  OSA on CPAP - CPAP at night Chronic thrombocytopenia  - stable Afib with RVR - rapid afib s/p dilt on 10/25 now resolved On beta blocker  Catelyn Friel H. Evangelynn Lochridge NP-C 11/23/2021, 11:04 AM  [employer] [phone]
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
## 2021-11-23 NOTE — Plan of Care (Signed)
  Problem: RH Balance Goal: LTG Patient will maintain dynamic sitting balance (PT) Description: LTG:  Patient will maintain dynamic sitting balance with assistance during mobility activities (PT) Flowsheets (Taken 11/23/2021 0538) LTG: Pt will maintain dynamic sitting balance during mobility activities with:: Independent with assistive device  Goal: LTG Patient will maintain dynamic standing balance (PT) Description: LTG:  Patient will maintain dynamic standing balance with assistance during mobility activities (PT) Flowsheets (Taken 11/22/2021 2038) LTG: Pt will maintain dynamic standing balance during mobility activities with:: Supervision/Verbal cueing   Problem: RH Bed Mobility Goal: LTG Patient will perform bed mobility with assist (PT) Description: LTG: Patient will perform bed mobility with assistance, with/without cues (PT). Flowsheets (Taken 11/22/2021 2038) LTG: Pt will perform bed mobility with assistance level of: Independent with assistive device    Problem: RH Bed to Chair Transfers Goal: LTG Patient will perform bed/chair transfers w/assist (PT) Description: LTG: Patient will perform bed to chair transfers with assistance (PT). Flowsheets (Taken 11/22/2021 2000) LTG: Pt will perform Bed to Chair Transfers with assistance level: Moderate Assistance - Patient 50 - 74%   Problem: RH Car Transfers Goal: LTG Patient will perform car transfers with assist (PT) Description: LTG: Patient will perform car transfers with assistance (PT). Flowsheets (Taken 11/22/2021 1538) LTG: Pt will perform car transfers with assist:: Contact Guard/Touching assist   Problem: RH Ambulation Goal: LTG Patient will ambulate in controlled environment (PT) Description: LTG: Patient will ambulate in a controlled environment, # of feet with assistance (PT). Flowsheets (Taken 11/22/2021 1538) LTG: Pt will ambulate in controlled environ  assist needed:: Supervision/Verbal cueing LTG: Ambulation distance in  controlled environment: 144f with LRAD Goal: LTG Patient will ambulate in home environment (PT) Description: LTG: Patient will ambulate in home environment, # of feet with assistance (PT). Flowsheets (Taken 11/22/2021 1538) LTG: Pt will ambulate in home environ  assist needed:: Supervision/Verbal cueing LTG: Ambulation distance in home environment: 526fwith LRAD   Problem: RH Wheelchair Mobility Goal: LTG Patient will propel w/c in controlled environment (PT) Description: LTG: Patient will propel wheelchair in controlled environment, # of feet with assist (PT) Flowsheets (Taken 11/23/2021 0538) LTG: Pt will propel w/c in controlled environ  assist needed:: Supervision/Verbal cueing LTG: Propel w/c distance in controlled environment: 15065f Problem: RH Stairs Goal: LTG Patient will ambulate up and down stairs w/assist (PT) Description: LTG: Patient will ambulate up and down # of stairs with assistance (PT) Flowsheets (Taken 11/23/2021 0538) LTG: Pt will ambulate up/down stairs assist needed:: Minimal Assistance - Patient > 75% LTG: Pt will  ambulate up and down number of stairs: 10 steps with LUE support to access home
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
## 2021-11-23 NOTE — Progress Notes (Signed)
Physical Therapy Session Note  Patient Details  Name: Kirk [name] MRN: [id number] Date of Birth: [birth year]  Today's Date: 11/23/2021 PT Individual Time: 1050-1200 and 1400-1438  PT Individual Time Calculation (min): 70 min   Short Term Goals: Week 1:  PT Short Term Goal 1 (Week 1): Pt will transfer to [facility] with CGA PT Short Term Goal 2 (Week 1): Pt will ambulate 126f with CGA PT Short Term Goal 3 (Week 1): Pt will propell WC >100 with supervision assist PT Short Term Goal 4 (Week 1): Pt will tolerate >2 hours out of bed between therapies   Skilled Therapeutic Interventions/Progress Updates:  Session 1  Pt received sitting in recliner and agreeable to PT. Pt performed stand pivot transfer with no AD and CGA from PT for safety. Pt transported to rehab gym [facility]  VS assessed.  Sitting 154/55 HR 64  Standing: 154/64. HR 67   Patient demonstrates increased fall risk as noted by score of   /56 on Berg Balance Scale.  (<36= high risk for falls, close to 100%; 37-45 significant >80%; 46-51 moderate >50%; 52-55 lower >25%)    Gait training with RW through hall x [id number] CGA-supervision assist from PT for safety no knee instability or LOB throughout. Additional gait training in day room with RW through holiday celebration. Functional reaching task to reach for and grab food as well as coloring and puzzle sheets. Performed x 6 at 5 different locations. Problem solving to allow PT to carry food back to seat   Sustained attention task of [place] with min cues for awareness of errors.    WC mobility with supervision assist and BUE x [id number] pt reports mild increase in HA.   Patient returned to room and performed stand pivot to recliner with RW and supervision assist. Pt left sitting in recliner with call bell in reach and all needs met.     Session 2.,  Pt received supine in bed and agreeable to PT. Supine>sit transfer with supervision assist and cues for management of  CPAP BP assessed  Sitting 153/55 Standing 156/62.   Stair management 3 x 4 steps. On last set, Noted to have absent seizure while standing on the top of step lasting 20sec. Pt then able to descend steps with min assist and remained disoriented and mildly responsive for additional 15-20 sec.    BP reassessed 164/64.   Pt returned to room and performed stand pivot transfer to bed with RW and min assist. Sit>supine completed with supervision assist and left supine in bed with call bell in reach and all needs met.  RN made aware      Therapy Documentation Precautions:  Precautions Precautions: Fall Precaution Comments: watch vitals, B knee buckling Restrictions Weight Bearing Restrictions: No General:   Vital Signs: Therapy Vitals Temp: 98.7 F (37.1 C) Temp Source: Oral Pulse Rate: 65 Resp: 18 BP: (!) 147/61 Patient Position (if appropriate): Sitting Oxygen Therapy SpO2: 100 % O2 Device: Room Air Pain: Session 1  Pain Assessment Pain Scale: 0-10 Pain Score: 8  Pain Type: Acute pain Pain Location: Head Pain Descriptors / Indicators: Aching Pain Frequency: Intermittent Pain Intervention(s): Medication (See eMAR) Session 2  Pain Assessment Pain Scale: 0-10 Pain Score: 8  Pain Type: Acute pain Pain Location: Head Pain Descriptors / Indicators: Aching Pain Frequency: Intermittent Pain Intervention(s): Medication (See eMAR)     Balance: Balance Balance Assessed: Yes Standardized Balance Assessment Standardized Balance Assessment: Berg[facility]cean[facility] Martinez[facility] Test  Sit to Stand: Able to stand  independently using hands Standing Unsupported: Able to stand 2 minutes with supervision Sitting with Back Unsupported but Feet Supported on Floor or Stool: Able to sit 2 minutes under supervision Stand to Sit: Controls descent by using hands Transfers: Able to transfer safely, definite need of hands Standing Unsupported with Eyes Closed: Able to stand 10 seconds with  supervision Standing Ubsupported with Feet Together: Able to place feet together independently and stand for 1 minute with supervision From Standing, Reach Forward with Outstretched Arm: Can reach forward >12 cm safely (5") From Standing Position, Pick up Object from Floor: Able to pick up shoe, needs supervision From Standing Position, Turn to Look Behind Over each Shoulder: Needs assist to keep from losing balance and falling Turn 360 Degrees: Needs close supervision or verbal cueing Standing Unsupported, Alternately Place Feet on Step/Stool: Able to complete >2 steps/needs minimal assist Standing Unsupported, One Foot in Front: Loses balance while stepping or standing Standing on One Leg: Tries to lift leg/unable to hold 3 seconds but remains standing independently Total Score: 30   Therapy/Group: Individual Therapy  Lorie Phenix 11/23/2021, 2:40 PM

## 2021-11-23 NOTE — Progress Notes (Signed)
Per Therapy patient had another episode of seizure lasting about 15-20 secs ; eyes rolling and disorientation for about 15-20 secs. Dan PA made aware. Continued to monitor.

## 2021-11-24 ENCOUNTER — Inpatient Hospital Stay (HOSPITAL_COMMUNITY): Payer: Medicare Other

## 2021-11-24 DIAGNOSIS — Z992 Dependence on renal dialysis: Secondary | ICD-10-CM | POA: Diagnosis not present

## 2021-11-24 DIAGNOSIS — D631 Anemia in chronic kidney disease: Secondary | ICD-10-CM | POA: Diagnosis not present

## 2021-11-24 DIAGNOSIS — S065X1D Traumatic subdural hemorrhage with loss of consciousness of 30 minutes or less, subsequent encounter: Secondary | ICD-10-CM | POA: Diagnosis not present

## 2021-11-24 DIAGNOSIS — R569 Unspecified convulsions: Secondary | ICD-10-CM

## 2021-11-24 DIAGNOSIS — I132 Hypertensive heart and chronic kidney disease with heart failure and with stage 5 chronic kidney disease, or end stage renal disease: Secondary | ICD-10-CM | POA: Diagnosis not present

## 2021-11-24 DIAGNOSIS — N186 End stage renal disease: Secondary | ICD-10-CM | POA: Diagnosis not present

## 2021-11-24 DIAGNOSIS — R519 Headache, unspecified: Secondary | ICD-10-CM | POA: Diagnosis not present

## 2021-11-24 DIAGNOSIS — N2581 Secondary hyperparathyroidism of renal origin: Secondary | ICD-10-CM | POA: Diagnosis not present

## 2021-11-24 LAB — CBC
HCT: 22.3 % — ABNORMAL LOW (ref 39.0–52.0)
Hemoglobin: 7.5 g/dL — ABNORMAL LOW (ref 13.0–17.0)
MCH: 31.5 pg (ref 26.0–34.0)
MCHC: 33.6 g/dL (ref 30.0–36.0)
MCV: 93.7 fL (ref 80.0–100.0)
Platelets: 68 10*3/uL — ABNORMAL LOW (ref 150–400)
RBC: 2.38 MIL/uL — ABNORMAL LOW (ref 4.22–5.81)
RDW: 13.6 % (ref 11.5–15.5)
WBC: 3.5 10*3/uL — ABNORMAL LOW (ref 4.0–10.5)
nRBC: 0 % (ref 0.0–0.2)

## 2021-11-24 LAB — RENAL FUNCTION PANEL
Albumin: 3.1 g/dL — ABNORMAL LOW (ref 3.5–5.0)
Anion gap: 11 (ref 5–15)
BUN: 39 mg/dL — ABNORMAL HIGH (ref 6–20)
CO2: 26 mmol/L (ref 22–32)
Calcium: 8.9 mg/dL (ref 8.9–10.3)
Chloride: 93 mmol/L — ABNORMAL LOW (ref 98–111)
Creatinine, Ser: 11.57 mg/dL — ABNORMAL HIGH (ref 0.61–1.24)
GFR, Estimated: 5 mL/min — ABNORMAL LOW (ref >60)
Glucose, Bld: 103 mg/dL — ABNORMAL HIGH (ref 70–99)
Phosphorus: 4.3 mg/dL (ref 2.5–4.6)
Potassium: 5 mmol/L (ref 3.5–5.1)
Sodium: 130 mmol/L — ABNORMAL LOW (ref 135–145)

## 2021-11-24 LAB — GLUCOSE, CAPILLARY
Glucose-Capillary: 140 mg/dL — ABNORMAL HIGH (ref 70–99)
Glucose-Capillary: 99 mg/dL (ref 70–99)

## 2021-11-24 LAB — HEPATITIS B SURFACE ANTIBODY, QUANTITATIVE: Hep B S AB Quant (Post): 7.5 m[IU]/mL — ABNORMAL LOW (ref >9.9)

## 2021-11-24 MED ORDER — CINACALCET HCL 30 MG PO TABS
60.0000 mg | ORAL_TABLET | ORAL | Status: DC
  Administered 2021-11-24 – 2021-11-29 (×3): 60 mg via ORAL
  Filled 2021-11-24 (×4): qty 2

## 2021-11-24 MED ORDER — LEVETIRACETAM 250 MG PO TABS
750.0000 mg | ORAL_TABLET | Freq: Every day | ORAL | Status: DC
  Administered 2021-11-24: 750 mg via ORAL
  Filled 2021-11-24: qty 3

## 2021-11-24 MED ORDER — SODIUM CHLORIDE 0.9 % IV SOLN
250.0000 mg | INTRAVENOUS | Status: DC
  Administered 2021-11-24 – 2021-11-27 (×2): 250 mg via INTRAVENOUS
  Filled 2021-11-24 (×3): qty 2.5

## 2021-11-24 MED ORDER — LIDOCAINE-PRILOCAINE 2.5-2.5 % EX CREA: 1.0000 | TOPICAL_CREAM | CUTANEOUS | Status: DC | PRN

## 2021-11-24 MED ORDER — LIDOCAINE HCL (PF) 1 % IJ SOLN: 5.0000 mL | INTRAMUSCULAR | Status: DC | PRN

## 2021-11-24 MED ORDER — PENTAFLUOROPROP-TETRAFLUOROETH EX AERO: 1.0000 | INHALATION_SPRAY | CUTANEOUS | Status: DC | PRN

## 2021-11-24 MED ORDER — SODIUM CHLORIDE 0.9 % IV SOLN
750.0000 mg | INTRAVENOUS | Status: DC
  Administered 2021-11-25 – 2021-11-27 (×3): 750 mg via INTRAVENOUS
  Filled 2021-11-24 (×4): qty 7.5

## 2021-11-24 NOTE — Consult Note (Addendum)
NEURO HOSPITALIST CONSULT NOTE   Requestig physician: Dr. Letta Pate  Reason for Consult: Concern for seizure   History obtained from: Patient and Chart     HPI:                                                                                                                                          Kirk [name] is a 61 y.o. 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 On exam on arrival to the ED he had new onset left-sided neglect and right forced gaze on 10/24 with resultant Code Stroke activation. Concern for seizure versus interval SDH bleeding with mild mass effect identified on CTH. Stroke was ruled out, with seizure due to subdural hematoma diagnosed at that time. Patient was initially started on Keppra then transitioned to Depakote due to headache. VPA level was subtherapeutic 10/23. Keppra was preferred to VPA in setting of bleed and head trauma and also given his hx thrombocytopenia. Therefore 10/23 VPA was d/c'd (w/o taper since subtherapeutic) and he was reloaded with and continued on Keppra. Lyrica, also an AED, was also added for headache at ESRD dosing. He was transferred to the Rehab service on 10/31. Since then, he has had recurrent spells managed with increased Keppra dosing to 750 mg QD (renally dosed) with 250 mg supplemental dosing on HD days. He had recurrence of spells again overnight 11/2-11/3. Neurology was re-consulted for repeat EEG and possible anticonvulsant medication titration.    Past Medical History:  Diagnosis Date   Acute edema of lung, unspecified    Acute, but ill-defined,  cerebrovascular disease    Allergy    Anemia    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)    Anxiety    Asthma    Asthma    moderate persistent   Carpal tunnel syndrome    Cellulitis and abscess of trunk    Cholelithiasis 07/13/2014   Chronic headaches    Debility, unspecified    Dermatophytosis of the body    Dysrhythmia    history of   Edema    End stage renal disease on dialysis [facility])    "MWF; [facility] in [facility]" (10/21/2014)   Essential hypertension, benign    GERD (gastroesophageal reflux disease)    Gout, unspecified    HTN (hypertension)    Hypertrophy of prostate without urinary obstruction and other lower urinary tract symptoms (LUTS)    Hypotension, unspecified  Impotence of organic origin    Insomnia, unspecified    Kidney replaced by transplant    Localization-related (focal) (partial) epilepsy and epileptic syndromes with complex partial seizures, without mention of intractable epilepsy    12-15-19- Wife states he has NEVER had a seizure    Lumbago    Memory loss    OSA on CPAP    Other and unspecified hyperlipidemia    controlled /managed per wife    Other chronic nonalcoholic liver disease    Other malaise and fatigue    Other nonspecific abnormal serum enzyme levels    Pain in joint, lower leg    Pain in joint, upper arm    Pneumonia "several times"   Renal dialysis status(V45.11) 02/05/2010   restarted 01/02/13 ofter renal trransplant failure   Secondary hyperparathyroidism (of renal origin)    Shortness of breath    Sleep apnea    wears cpap    Tension headache    Unspecified constipation    Unspecified essential hypertension    Unspecified hereditary and idiopathic peripheral neuropathy    Unspecified vitamin D deficiency     Past Surgical History:  Procedure Laterality Date   AV FISTULA PLACEMENT Left ?2010   "forearm; at [place]"   [place]  03/21/2011   CHOLECYSTECTOMY N/A  10/21/2014   Procedure: LAPAROSCOPIC CHOLECYSTECTOMY WITH INTRAOPERATIVE CHOLANGIOGRAM;  Surgeon: Autumn Messing III, MD;  Location: [place];  Service: General;  Laterality: N/A;   COLONOSCOPY     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   KIDNEY TRANSPLANT  08/17/2011   [place]  10/21/2014   w/IOC   LEFT HEART CATHETERIZATION WITH CORONARY ANGIOGRAM N/A 03/21/2011   Procedure: LEFT HEART CATHETERIZATION WITH CORONARY ANGIOGRAM;  Surgeon: [name], MD;  Location: [facility] CATH LAB;  Service: Cardiovascular;  Laterality: N/A;   NEPHRECTOMY  08/2013   removed transplaned kidney   POLYPECTOMY     POSTERIOR FUSION CERVICAL SPINE  06/25/2012   for spinal stenosis   VASECTOMY  2010    Family History  Adopted: Yes  Problem Relation Age of Onset   Colon cancer Neg Hx    Esophageal cancer Neg Hx    Rectal cancer Neg Hx    Stomach cancer Neg Hx    Colon polyps Neg Hx              Social History:  reports that he quit smoking about 21 months ago. His smoking use included cigarettes. He started smoking about 30 years ago. He has a 16.00 pack-year smoking history. He has never used smokeless tobacco. He reports that he does not drink alcohol and does not use drugs.  Allergies  Allergen Reactions   Codeine Nausea And Vomiting   Hydrocodone-Acetaminophen Nausea And Vomiting    MEDICATIONS:  Scheduled:  amLODipine  10 mg Oral QPM   calcitRIOL  1 mcg Oral Q M,W,F-HD   Chlorhexidine Gluconate Cloth  6 each Topical Q0600   cholecalciferol  4,000 Units Oral Daily   doxazosin  4 mg Oral Daily   guaiFENesin  1,200 mg Oral BID   hydrALAZINE  100 mg Oral Q8H   isosorbide mononitrate  60 mg Oral Daily   levETIRAcetam  250 mg Oral Q M,W,F-1800   levETIRAcetam  750 mg Oral Daily   losartan  100 mg Oral QPM   methocarbamol  750 mg Oral QID    metoprolol tartrate  100 mg Oral BID   multivitamin  1 tablet Oral Daily   pantoprazole  40 mg Oral BID AC   pravastatin  40 mg Oral Daily   pregabalin  50 mg Oral Daily   senna-docusate  1 tablet Oral BID   sevelamer carbonate  3,200 mg Oral TID WC   sucralfate  1 g Oral TID WC & HS   traZODone  50 mg Oral QHS    ROS:                                                                                                                                       As per HPI.    Blood pressure (!) 160/54, pulse 62, temperature 97.6 F (36.4 C), temperature source Oral, resp. rate 20, height '5\' 7"'$  (1.702 m), weight 94.3 kg, SpO2 100 %.   General Examination:                                                                                                       Gen: laying in bed in NAD  Resp: non-labored breathing, no respiratory distress on room air Abd: soft, non-tender, non-distended   Neuro: Mental Status: Awake, alert to self, age, and month and place, city. Mild dysarthria noted. Answers questions appropriately.  Cranial Nerves: PERRL, eyes are midline, follows examiner.  no facial asymmetry, facial sensation intact, hearing intact, tongue/uvula/soft palate midline, normal sternocleidomastoid and trapezius muscle strength. No evidence of tongue atrophy or fasciculations Motor: He is able to move all extremities spontaneously and antigravity, bilateral legs are antigravity with a slight drift  Sensory: Responds to noxious stimuli  Coordination: No ataxia  Gait: Deferred   Lab Results: Basic Metabolic Panel: Recent Labs  Lab 11/17/21 1517 11/19/21 0326  NA 134* 130*  K 3.2* 3.9  CL 92* 91*  CO2 30 25  GLUCOSE 123*  102*  BUN 18 32*  CREATININE 5.60* 10.51*  CALCIUM 8.8* 9.1  PHOS 3.6 5.7*    CBC: Recent Labs  Lab 11/19/21 0326 11/21/21 0321  WBC 3.2* 4.7  HGB 8.3* 8.3*  HCT 24.5* 26.0*  MCV 90.7 94.2  PLT 74* 101*    Cardiac Enzymes: No results for input(s):  "CKTOTAL", "CKMB", "CKMBINDEX", "TROPONINI" in the last 168 hours.  Lipid Panel: No results for input(s): "CHOL", "TRIG", "HDL", "CHOLHDL", "VLDL", "LDLCALC" in the last 168 hours.  Imaging: No results found.     Assessment: 61 year old dialysis patient with acute right SDH with new onset seizure presenting on 10/17. Patient with new onset left-sided neglect and right forced gaze on 10/24 with resultant Code Stroke activation. Concern for seizure versus interval SDH bleeding with mild mass effect identified on CTH. Patient was initially started on keppra then transitioned to depakote bc he was having headache. VPA level subtherapeutic 10/23. Keppra is preferred to VPA in setting of bleed and head trauma and also with his hx thrombocytopenia. Therefore 10/23 VPA was d/c'd (w/o taper since subtherapeutic) and he was reloaded with and continued on keppra. Lyrica, also an AED, was also added for headache at ESRD dosing. He was transferred to the Rehab service on 10/31. Since then, he has had recurrent spells managed with increased Keppra dosing to 750 mg BID. He had recurrence of spells again overnight 11/2-11/3. Neurology was re-consulted for repeat EEG and possible anticonvulsant medication titration.   - Symptomatically, today patient reports a severe headache and nausea. On exam he is alert and oriented x 4. PERRL EOM intact, headache worse with eye movements. no facial asymmetry, facial sensation intact. Bilateral drift noted in lower extremities.  - LTM EEG 10/24-10/25 (most recent EEG): Continuous slow, generalized and lateralized right hemisphere. This study was initially suggestive of cortical dysfunction arising from right hemisphere likely secondary to postictal state as well as moderate to severe diffuse encephalopathy.  Gradually, EEG improved and was suggestive of mild to moderate diffuse encephalopathy, nonspecific etiology.  No seizures or epileptiform discharges were seen throughout the  recording. 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 Additional smaller subdural collections along the left cerebral convexity, along the right tentorial leaflet, and along the right inferior temporal lobe, measuring up to 5 mm. No additional acute intracranial process. No seizure etiology identified. -11/3- CT Head- Slight interval increase in size of the mixed density subacute right cerebral convexity subdural hematoma now measuring up to 1.3 cm (previously measured 1.0 cm), with trace new/acute blood and mild mass effect and trace leftward midline shift. - Keppra level down to 11.7 (15-40 is therapeutic)  - DDx for spell recurrence overnight includes recurrent seizures versus orthostasis. Nephrology has also been contacted by Rehab service.    Impression: - New onset seizures in late October due to recent right SDH  - Intractable headache with nausea not relieved by zofran   Recommendations: - Ordering repeat EEG - Keppra 750 mg daily and 250 mg after HD. Has been switched to IV to assess for possible malabsorption of the PO form given low serum Keppra levels. If levels do not change significantly with change to IV, then not an absorption effect and can be switched back to PO.  - May need to increase Keppra dose pending EEG results - Continue Lyrica to  $'50mg'l$  once daily (ESRD dosing, serves as AED and also for headaches) - Stat head CT  - Seizure precautions  - 2 mg IV Ativan STAT for seizure activity lasting > 3 minutes and notify neurology  - Will continue to follow    Addendum: - EEG: Continuous slow, right parieto-occipital region. This study is suggestive of cortical dysfunction arising from right parieto-occipital region likely secondary to underlying SDH. No seizures or definite epileptiform discharges  were seen throughout the recording. - STAT head CT with increased size of the right subdural hematoma, with associated increase in mass effect.  - We have discussed with PM and R and have advised Neurosurgery reassessment.   Electronically signed: Dr. Kerney Elbe

## 2021-11-24 NOTE — IPOC Note (Signed)
Overall Plan of Care [facility]) Patient Details Name: Kirk [name] MRN: [id number] DOB: Mar 18, 1960  Admitting Diagnosis: Traumatic subdural hematoma (SDH) [facility])  Hospital Problems: Principal Problem:   Traumatic subdural hematoma (SDH) ([place]) Active Problems:   Uncontrolled hypertension   ESRD on dialysis ([place])   Chronic systolic heart failure ([place])     Functional Problem List: Nursing Bowel, Endurance, Medication Management, Pain, Safety, Nutrition  PT Balance, Behavior, Endurance, Motor, Pain, Perception, Safety, Sensory, Skin Integrity  OT Balance, Cognition, Endurance, Motor  SLP    TR         Basic ADL's: OT Bathing, Dressing, Toileting     Advanced  ADL's: OT       Transfers: PT Bed Mobility, Bed to Chair, Car, [employer], Floor  OT Other (comment) (BSC)     Locomotion: PT Ambulation, Stairs, Wheelchair Mobility     Additional Impairments: OT None  SLP        TR      Anticipated Outcomes Item Anticipated Outcome  Self Feeding independent  Swallowing      Basic self-care  Mod I with dressing, set up bathing  Toileting  Mod I   Bathroom Transfers supervision  Bowel/Bladder  manage bowel w mod I  Transfers  supervision assist  Locomotion  Supervision assist with LRAD  Communication     Cognition     Pain  < 4 with prns  Safety/Judgment  manage w cues   Therapy Plan: PT Intensity: Minimum of 1-2 x/day ,45 to 90 minutes PT Frequency: 5 out of 7 days PT Duration Estimated Length of Stay: 10-14 days OT Intensity: Minimum of 1-2 x/day, 45 to 90 minutes OT Frequency: 5 out of 7 days OT Duration/Estimated Length of Stay: 12- 14 days     Team Interventions: Nursing Interventions Patient/Family Education, Bowel Management, Disease Management/Prevention, Pain Management, Medication Management, Discharge Planning, Dysphagia/Aspiration Precaution Training  PT interventions Ambulation/gait training, Balance/vestibular training, Cognitive  remediation/compensation, Disease management/prevention, Discharge planning, Community reintegration, DME/adaptive equipment instruction, Functional electrical stimulation, Functional mobility training, Patient/family education, Pain management, Neuromuscular re-education, Psychosocial support, Skin care/wound management, Splinting/orthotics, Therapeutic Exercise, Therapeutic Activities, Stair training, UE/LE Strength taining/ROM, UE/LE Coordination activities, Visual/perceptual remediation/compensation, Wheelchair propulsion/positioning  OT Interventions Training and development officer, Cognitive remediation/compensation, Discharge planning, DME/adaptive equipment instruction, Functional mobility training, Neuromuscular re-education, Pain management, Patient/family education, Psychosocial support, Self Care/advanced ADL retraining, Therapeutic Activities, Therapeutic Exercise, UE/LE Strength taining/ROM, UE/LE Coordination activities, Visual/perceptual remediation/compensation  SLP Interventions    TR Interventions    SW/CM Interventions Discharge Planning, Psychosocial Support, Patient/Family Education, Disease Management/Prevention   Barriers to Discharge MD  Medical stability  Nursing Decreased caregiver support, Hemodialysis, Home environment access/layout 1 level 3/10 ste w spouse; plumbing issues; no toilet access  PT Home environment access/layout, Hemodialysis, Medication compliance, Behavior    OT Inaccessible home environment 10 steps to entrance with no rail  SLP      SW Hemodialysis, Lack of/limited family support, Decreased caregiver support, Inaccessible home environment, Home environment access/layout HD: MWF [place].   Team Discharge Planning: Destination: PT-Home ,OT- Home , SLP-  Projected Follow-up: PT-Home health PT, OT-  Home health OT, SLP-  Projected Equipment Needs: PT-Rolling walker with 5" wheels, To be determined, OT- None recommended by OT, SLP-   Equipment Details: PT- , OT-  Patient/family involved in discharge planning: PT- Patient,  OT-Patient, SLP-   MD ELOS: 12-14d Medical Rehab Prognosis:  Good Assessment: The patient has been admitted for CIR therapies with the diagnosis of Right  SDH. The team will be addressing functional mobility, strength, stamina, balance, safety, adaptive techniques and equipment, self-care, bowel and bladder mgt, patient and caregiver education, Seizure control . Goals have been set at [facility]/Sup. Anticipated discharge destination is Home with wife.        See Team Conference Notes for weekly updates to the plan of care

## 2021-11-24 NOTE — Progress Notes (Signed)
PROGRESS NOTE   Subjective/Complaints: Short spell in therapy yesterday , per RN note lasted 15-20s, eye rolling and disorientation  ROS- neg CP, SOB, N/V/D Objective:   No results found. No results for input(s): "WBC", "HGB", "HCT", "PLT" in the last 72 hours.  No results for input(s): "NA", "K", "CL", "CO2", "GLUCOSE", "BUN", "CREATININE", "CALCIUM" in the last 72 hours.  Intake/Output Summary (Last 24 hours) at 11/24/2021 0711 Last data filed at 11/23/2021 1804 Gross per 24 hour  Intake 597 ml  Output --  Net 597 ml         Physical Exam: Vital Signs Blood pressure (!) 160/54, pulse 62, temperature 97.6 F (36.4 C), temperature source Oral, resp. rate 20, height '5\' 7"'$  (1.702 m), weight 94.3 kg, SpO2 100 %.   General: No acute distress Mood and affect are appropriate Heart: Regular rate and rhythm no rubs , Gr 3/6 systolic murmur, no extra sounds Lungs: Clear to auscultation, breathing unlabored, no rales or wheezes Abdomen: Positive bowel sounds, soft nontender to palpation, nondistended Extremities: No clubbing, cyanosis, or edema, LUE AVG Skin: No evidence of breakdown, no evidence of rash Neurologic: Cranial nerves II through XII intact, motor strength is 5/5 in bilateral deltoid, bicep, tricep, grip, hip flexor, knee extensors, ankle dorsiflexor and plantar flexor Sensory exam normal sensation to light touch and proprioception in bilateral upper and lower extremities Cerebellar exam normal finger to nose to finger as well as heel to shin in bilateral upper and lower extremities Musculoskeletal: Full range of motion in all 4 extremities. No joint swelling   Assessment/Plan: 1. Functional deficits which require 3+ hours per day of interdisciplinary therapy in a comprehensive inpatient rehab setting. Physiatrist is providing close team supervision and 24 hour management of active medical problems listed  below. Physiatrist and rehab team continue to assess barriers to discharge/monitor patient progress toward functional and medical goals  Care Tool:  Bathing    Body parts bathed by patient: Right arm, Left arm, Chest, Abdomen, Front perineal area, Buttocks, Right upper leg, Left upper leg, Face, Left lower leg, Right lower leg         Bathing assist Assist Level: Supervision/Verbal cueing     Upper Body Dressing/Undressing Upper body dressing   What is the patient wearing?: Pull over shirt    Upper body assist Assist Level: Supervision/Verbal cueing    Lower Body Dressing/Undressing Lower body dressing      What is the patient wearing?: Underwear/pull up, Pants     Lower body assist Assist for lower body dressing: Supervision/Verbal cueing     Toileting Toileting    Toileting assist Assist for toileting: Supervision/Verbal cueing     Transfers Chair/bed transfer  Transfers assist     Chair/bed transfer assist level: Contact Guard/Touching assist     Locomotion Ambulation   Ambulation assist      Assist level: Moderate Assistance - Patient 50 - 74% Assistive device: Hand held assist Max distance: 55   Walk 10 feet activity   Assist     Assist level: Moderate Assistance - Patient - 50 - 74% Assistive device: Hand held assist   Walk 50 feet activity   Assist  Assist level: Moderate Assistance - Patient - 50 - 74%      Walk 150 feet activity   Assist Walk 150 feet activity did not occur: Safety/medical concerns         Walk 10 feet on uneven surface  activity   Assist Walk 10 feet on uneven surfaces activity did not occur: Safety/medical concerns         Wheelchair     Assist Is the patient using a wheelchair?: Yes Type of Wheelchair: Manual    Wheelchair assist level: Minimal Assistance - Patient > 75% Max wheelchair distance: 50    Wheelchair 50 feet with 2 turns activity    Assist        Assist  Level: Minimal Assistance - Patient > 75%   Wheelchair 150 feet activity     Assist      Assist Level: Maximal Assistance - Patient 25 - 49%   Blood pressure (!) 160/54, pulse 62, temperature 97.6 F (36.4 C), temperature source Oral, resp. rate 20, height '5\' 7"'$  (1.702 m), weight 94.3 kg, SpO2 100 %.    Medical Problem List and Plan: 1. Functional deficits secondary to subdural hematoma traumatic following seizure Unclear whether brief 15-20s episode yesterday was seizure or BP related  Keppra level on low side of therapeutic range              -patient may  shower             -ELOS/Goals: min A 12-14 days, evaluations with PT, OT today  2.  Antithrombotics: -DVT/anticoagulation:  Mechanical: Antiembolism stockings, thigh (TED hose) Bilateral lower extremities             -antiplatelet therapy: N/A 3. Pain Management: Oxycodone/Robaxin as needed Chronic HA on LYrica  4. Mood/Behavior/Sleep/history of memory loss: Trazodone 50 mg nightly             -antipsychotic agents: N/A 5. Neuropsych/cognition: This patient is capable of making decisions on his own behalf. 6. Skin/Wound Care: Routine skin checks 7. Fluids/Electrolytes/Nutrition: Routine in and outs with follow-up chemistries 8.  End-stage renal disease.  Continue hemodialysis as directed- is basically anuric- forms 1 oz/urine/day at most.  9.  Chronic headaches.  Improved Lyrica 50 mg daily- ? For prevention- not getting any results so far with Lyrica 10.  Seizure prophylaxis.  Continue Keppra as indicated by neurology. Increased levetiracetam to '750mg'$  per day and extra '250mg'$  on HD days, recheck level Monday , monitor cognition  11.  PAF with RVR.  Not a candidate for anticoagulation.  Lopressor 100 mg twice daily 12.  Hypertension.  Cardura 4 mg daily, Cozaar 100 mg daily, hydralazine 100 mg every 8 hours, Imdur 60 mg daily.  Monitor with increased mobility Vitals:   11/23/21 1950 11/24/21 0406  BP: (!) 152/54 (!) 160/54   Pulse: 64 62  Resp: 20 20  Temp: 98.3 F (36.8 C) 97.6 F (36.4 C)  SpO2: 100% 100%   Overall improved appreciate nephro help, HD today  13.  Hyperlipidemia.  Pravachol 14.  Anemia of chronic disease.  Follow-up CBC 15.  GERD.  Protonix/Carafate 16.  Chronic diastolic congestive heart failure.  Monitor for any signs of fluid overload 17.  Obesity.  BMI 32.49.  Dietary follow-up 18.  OSA.  CPAP 19.  CHronic hearing issues but not severe , likely not Al[place] syndrome , denies having cochlear implant , none seen of CT head  20.  Systolic heart murmur with ECHO demonstrating aortic  stenosis. LOS: 3 days A FACE TO FACE EVALUATION WAS PERFORMED  Charlett Blake 11/24/2021, 7:11 AM

## 2021-11-24 NOTE — Procedures (Signed)
Patient Name: Kirk [name]  MRN: [id number]  Epilepsy Attending: [name]  Referring Physician/Provider: Kerney Elbe, MD  Date: 11/24/2021 Duration: 24.37 mins  Patient history: 61 year old dialysis patient with acute right SDH with new onset seizure presenting on 10/17. Patient with new onset left-sided neglect and right forced gaze on 10/24.  EEG to evaluate for seizure  Level of alertness: Awake, asleep  AEDs during EEG study: LEV, PGB  Technical aspects: This EEG study was done with scalp electrodes positioned according to the 10-20 International system of electrode placement. Electrical activity was reviewed with band pass filter of 1-'70Hz'$ , sensitivity of 7 uV/mm, display speed of 27m/sec with a '60Hz'$  notched filter applied as appropriate. EEG data were recorded continuously and digitally stored.  Video monitoring was available and reviewed as appropriate.  Description: The posterior dominant rhythm consists of 8 Hz activity of moderate voltage (25-35 uV) seen predominantly in posterior head regions, symmetric and reactive to eye opening and eye closing. Sleep was characterized by vertex waves, sleep spindles (12 to 14 Hz), maximal frontocentral region. EEG showed near continuous 3 to 6 Hz theta-delta slowing in right parieto-occipital region. Hyperventilation and photic stimulation were not performed.     ABNORMALITY - Continuous slow, right parieto-occipital region  IMPRESSION: This study is suggestive of cortical dysfunction arising from right parieto-occipital region likely secondary to underlying SDH. No seizures or definite epileptiform discharges were seen throughout the recording.   Dwana Garin OBarbra Sarks

## 2021-11-24 NOTE — Progress Notes (Signed)
Pt places self on CPAP, if pt needs help he knows to contact RN to contact RT.

## 2021-11-24 NOTE — Progress Notes (Signed)
1115 Patient in room after Ct head orders this am related to  increased severity of headaches and  nausea zofran given at 0517 to relieve nausea with little effect.  Neurology consulted for seizure and other meds per dr. Dianna Limbo. Stat CT head completed with results of new increased SDH to 1.3 cm from 1.0 cm reported PA.   after CT head completed patient c/o  pain in head and continued nausea Zofran and oxycodone given for pain and nausea  left side tingling in fingers and hand new onset slurred speech charge nurse Rapid response and Pa notified per protocol. Rapid response at bedside to evaluate patient request stat neurology consult Vs and CBG per flowsheets wife notified of situation.

## 2021-11-24 NOTE — Progress Notes (Signed)
SLP Cancellation Note  Patient Details Name: AQIL GOETTING MRN: [id number] DOB: [birth year]   Cancelled evaluation: Pt unable to be seen for scheduled ST evaluation due to MD hold for further work-up with pt demonstrating slurred speech upon verbalization and UE numbness; RN aware. Will continue efforts to evaluate.      Kiree Dejarnette A Kaylee Trivett 11/24/2021, 11:18 AM

## 2021-11-24 NOTE — Progress Notes (Signed)
Occupational Therapy Session Note  Patient Details  Name: Kirk [name] MRN: [id number] Date of Birth: 1960/02/23  Today's Date: 11/24/2021 OT Individual Time: 0800-0827 OT Individual Time Calculation (min): 27 min  48 mins missed; neuro order stat CT    Short Term Goals: Week 1:  OT Short Term Goal 1 (Week 1): Pt will be able to ambulate to toilet with LRAD with light CGA. OT Short Term Goal 2 (Week 1): Pt will demonstrate improved standing balance with donning pants with close S with no posterior lean. OT Short Term Goal 3 (Week 1): Pt will learn gentle self ROM exercises he can do for his B shoulders.  Skilled Therapeutic Interventions/Progress Updates:  Pt greeted supine in bed with Cpap donned, pt reports feeling "motion sickness" and "pounding HA" alerted RN, RN to come in and provide morning meds. Pt completed supine>sit with supervision, donned shoes with set- up assist. Pt stood from EOB but reports dizziness, pt returned to supine with supervision. Of note per PT notes from yesterday pt did experience absent seizure for ~ 20 secs.  Assessed BP from supine- 165/58 ( 89) HR 64  Neurology enter to assess pt who order stat CT to assess for further bleeding with request to hold therapy this AM.  Pt left supine in bed with RN present.   Therapy Documentation Precautions:  Precautions Precautions: Fall Precaution Comments: watch vitals, B knee buckling Restrictions Weight Bearing Restrictions: No  Pain: Unrated pain in head, meds provided and therapy held.    Therapy/Group: Individual Therapy  Corinne Ports [facility] 11/24/2021, 8:40 AM

## 2021-11-24 NOTE — Significant Event (Addendum)
Rapid Response Event Note   Reason for Call :  Speech changes, pt c/o increased numbness to his left side  Initial Focused Assessment:  Pt lying in bed, alert. NIHSS remarkable for disorientation to age, bilateral LE drift, sensory changes, and dysarthria.  VS: T 98.74F, BP 173/70, HR 74, RR 16, SpO2 100% on room air CBG: 140  Interventions:  No intervention from RR RN  Plan of Care:  -Neurology saw patient earlier today and ordered EEG and CT head -Neurology to speak to neurosurgery regarding change in SDH on CT head -Pt due for hemodialysis. Nephrology does not want SBP treated as they feel HTN is fluid related and they want to maximize potential for fluid removal -Determine if SBP parameters need to be adjusted in the setting of SDH changes.   Call rapid response for additional needs  Event Summary:  MD Notified: Helyn Numbers., PA Call Time: [place] Time: 2947 End Time: 1200  Addendum 11/24/2021 1620: Called to hemodialysis for pt with upper body shaking. Lasting for a minute, then intermittent. Pt alert and conversing with staff during episodes. PERRLA, 65m. EOMI. He is no longer dysarthric, compared to prior exam. No intervention from RR RN.   VS: BP 189/95, HR 64, RR 20, SpO2 99% on room air  SCasimer Bilis RN

## 2021-11-24 NOTE — Progress Notes (Signed)
Patient in bed,staffs noticed a jerking /shaking movement on his upper extremities,it lasted for a whole minute.Patient did not loss his consciousness,in fact ,he was verbally responsive appropriately to questions.No rolling eyeball observed,no drooling.RR notified for second assessement.Vitals signs were normal.Per RR ok,to continue  treatment.

## 2021-11-24 NOTE — Progress Notes (Signed)
[facility] Progress Note   Subjective:  Seen at onset of HD - 4L UFG planned, will monitor BP closely. He reports mild headache, no dyspnea. Reviewed chart from this AM - RR called due to increased L sided numbness -> neuro consulted, head CT with enlargement of SDH (1cm -> 1.3cm), per notes they will be contacting Nsg team. PO Keppra changed to IV for now, will supplement after dialysis today.  Objective Vitals:   11/24/21 0406 11/24/21 1153 11/24/21 1500 11/24/21 1520  BP: (!) 160/54 (!) 173/70 (!) 158/65 (!) 179/64  Pulse: 62 74 73 70  Resp: '20 16 19 15  '$ Temp: 97.6 F (36.4 C) 98.7 F (37.1 C) 98.2 F (36.8 C)   TempSrc: Oral Axillary    SpO2: 100% 100% 98% 99%  Weight:   96.7 kg   Height:       Physical Exam General: Chronically ill appearing man, NAD. Room air.  Heart: RRR; no murmur Lungs: CTAB; no rales Abdomen: soft Extremities: no LE edema Dialysis Access:  L AVF + thrill  Additional Objective Labs: Basic Metabolic Panel: Recent Labs  Lab 11/19/21 0326  NA 130*  K 3.9  CL 91*  CO2 25  GLUCOSE 102*  BUN 32*  CREATININE 10.51*  CALCIUM 9.1  PHOS 5.7*   Liver Function Tests: Recent Labs  Lab 11/19/21 0326  ALBUMIN 3.1*   CBC: Recent Labs  Lab 11/19/21 0326 11/21/21 0321  WBC 3.2* 4.7  HGB 8.3* 8.3*  HCT 24.5* 26.0*  MCV 90.7 94.2  PLT 74* 101*   Studies/Results: EEG adult  Result Date: 11/24/2021 Kirk Havens, MD     11/24/2021 11:38 AM Patient Name: Kirk [name] MRN: [id number] Epilepsy Attending: Lora [name] Referring Physician/Provider: Kerney Elbe, MD Date: 11/24/2021 Duration: 24.37 mins Patient history: 61 year old dialysis patient with acute right SDH with new onset seizure presenting on 10/17. Patient with new onset left-sided neglect and right forced gaze on 10/24.  EEG to evaluate for seizure Level of alertness: Awake, asleep AEDs during EEG study: LEV, PGB Technical aspects: This EEG study was done with  scalp electrodes positioned according to the 10-20 International system of electrode placement. Electrical activity was reviewed with band pass filter of 1-'70Hz'$ , sensitivity of 7 uV/mm, display speed of 47m/sec with a '60Hz'$  notched filter applied as appropriate. EEG data were recorded continuously and digitally stored.  Video monitoring was available and reviewed as appropriate. Description: The posterior dominant rhythm consists of 8 Hz activity of moderate voltage (25-35 uV) seen predominantly in posterior head regions, symmetric and reactive to eye opening and eye closing. Sleep was characterized by vertex waves, sleep spindles (12 to 14 Hz), maximal frontocentral region. EEG showed near continuous 3 to 6 Hz theta-delta slowing in right parieto-occipital region. Hyperventilation and photic stimulation were not performed.   ABNORMALITY - Continuous slow, right parieto-occipital region IMPRESSION: This study is suggestive of cortical dysfunction arising from right parieto-occipital region likely secondary to underlying SDH. No seizures or definite epileptiform discharges were seen throughout the recording. Kirk [name]  CT HEAD WO CONTRAST (5MM)  Result Date: 11/24/2021 CLINICAL DATA:  Headache history of subdural hematoma. EXAM: CT HEAD WITHOUT CONTRAST TECHNIQUE: Contiguous axial images were obtained from the base of the skull through the vertex without intravenous contrast. RADIATION DOSE REDUCTION: This exam was performed according to the departmental dose-optimization program which includes automated exposure control, adjustment of the mA and/or kV according to patient size and/or use  of iterative reconstruction technique. COMPARISON:  Brain MRI 11/16/2021, CT head 11/14/2021 FINDINGS: Brain: The mixed density but primarily hypodense right cerebral convexity subdural hematoma measures up to 1.3 cm in maximal thickness in the coronal plane (5-44), increased in size compared to the head CT from  11/14/2021 when it measured up to 1.0 cm. There is likely trace new/acute blood within the collection (5-34). There is mass effect on the underlying brain parenchyma with sulcal effacement, partial effacement of the right lateral ventricle, and trace leftward midline shift. The smaller left-sided subdural seen on the prior MRI is not seen on the current study. There is no other acute intracranial hemorrhage. There is no acute territorial infarct. Background parenchymal volume is normal. The ventricles are otherwise normal in size. Gray-white differentiation is preserved. There is no solid mass lesion. Vascular: No hyperdense vessel or unexpected calcification. Skull: Normal. Negative for fracture or focal lesion. Sinuses/Orbits: The paranasal sinuses are clear. The globes and orbits are unremarkable. Other: None. IMPRESSION: Slight interval increase in size of the mixed density subacute right cerebral convexity subdural hematoma now measuring up to 1.3 cm (previously measured 1.0 cm), with trace new/acute blood and mild mass effect and trace leftward midline shift. Electronically Signed   By: Kirk Mole M.D.   On: 11/24/2021 09:00    Medications:  levETIRAcetam     [START ON 11/25/2021] levETIRAcetam      amLODipine  10 mg Oral QPM   calcitRIOL  1 mcg Oral Q M,W,F-HD   Chlorhexidine Gluconate Cloth  6 each Topical Q0600   cholecalciferol  4,000 Units Oral Daily   doxazosin  4 mg Oral Daily   guaiFENesin  1,200 mg Oral BID   hydrALAZINE  100 mg Oral Q8H   isosorbide mononitrate  60 mg Oral Daily   losartan  100 mg Oral QPM   methocarbamol  750 mg Oral QID   metoprolol tartrate  100 mg Oral BID   multivitamin  1 tablet Oral Daily   pantoprazole  40 mg Oral BID AC   pravastatin  40 mg Oral Daily   pregabalin  50 mg Oral Daily   senna-docusate  1 tablet Oral BID   sevelamer carbonate  3,200 mg Oral TID WC   sucralfate  1 g Oral TID WC & HS   traZODone  50 mg Oral QHS    Dialysis Orders: MWF  at [facility] 4:15hr, 4500/A1.5, EDW 89.9kg, 2K/2Ca bath, UFP #4, LUE AVF, heparin 2500 unit bolus -> NOW STOPPED - Sensipar '60mg'$  PO q HD - Calcitriol 1.75mg PO q HD  Assessment/Plan: New onset seizures: D/t R SDH per neuro. On keppra -> changed from PO to IV and post-HD supplement added. Subdural hematoma: Nsg consulted, recommended conservative management. Now with enlarging bleed on 11/3 CT - awaiting further recommendations, if any. ESRD: Continue HD on MWF - HD today, no heparin due to SDH. HTN/volume: On 5 anti-HTN meds and BP remains high, well above EDW -- need to get fluif off him, 4L UFG today. Anemia of ESRD: Hgb 8.3 - resume ESA once BP better as risk that would worsen HTN acutely. Secondary HPTH: Ca ok, Phos ^ slightly. Continue Renvela/VDRA, resume Sensipar. OSA on C-pap Chronic thrombocytopenia Hx A-fib with RVR 10/25 - rate controlled now, on metoprolol BID. Dispo: Unclear at this time.   KVeneta Penton PA-C 11/24/2021, 3:26 PM  [employer]

## 2021-11-24 NOTE — Progress Notes (Signed)
Received patient before the start of his HD treatment,awake alert and oriented  x 2, place peersons.  Access used : Left upper arm fistula,works well during treatment.  HD Treatment issues. None ,see earlier note.  Fluid removed; 4,000 cc on 3.5 hours of treatment.  Medicine given : Keppra 250 mg =110 cc  Hands off to the unit nurse.

## 2021-11-24 NOTE — Progress Notes (Signed)
Physical Therapy Session Note  Patient Details  Name: Kirk [name] MRN: [id number] Date of Birth: 10/09/1960  Today's Date: 11/24/2021   Short Term Goals: Week 1:  PT Short Term Goal 1 (Week 1): Pt will transfer to [facility] with CGA PT Short Term Goal 2 (Week 1): Pt will ambulate 138f with CGA PT Short Term Goal 3 (Week 1): Pt will propell WC >100 with supervision assist PT Short Term Goal 4 (Week 1): Pt will tolerate >2 hours out of bed between therapies  Skilled Therapeutic Interventions/Progress Updates:   Attempted to see pt for PT treatment. Pt off unit for Stat CT. Per OT note, Neurology requesting pt on hold until CT and EEG completed. Will reattempt to see pt for treatment at later time/date, provided pt is medically stable.      Therapy Documentation Precautions:  Precautions Precautions: Fall Precaution Comments: watch vitals, B knee buckling Restrictions Weight Bearing Restrictions: No General: PT Amount of Missed Time (min): 60 Minutes PT Missed Treatment Reason: MD hold (Comment)    Therapy/Group: Individual Therapy  Kirk Phenix11/03/2021, 11:10 AM

## 2021-11-24 NOTE — Progress Notes (Signed)
EEG complete - results pending 

## 2021-11-25 DIAGNOSIS — G44309 Post-traumatic headache, unspecified, not intractable: Secondary | ICD-10-CM | POA: Diagnosis not present

## 2021-11-25 DIAGNOSIS — I132 Hypertensive heart and chronic kidney disease with heart failure and with stage 5 chronic kidney disease, or end stage renal disease: Secondary | ICD-10-CM | POA: Diagnosis not present

## 2021-11-25 DIAGNOSIS — D631 Anemia in chronic kidney disease: Secondary | ICD-10-CM | POA: Diagnosis not present

## 2021-11-25 DIAGNOSIS — N2581 Secondary hyperparathyroidism of renal origin: Secondary | ICD-10-CM | POA: Diagnosis not present

## 2021-11-25 DIAGNOSIS — S065X1S Traumatic subdural hemorrhage with loss of consciousness of 30 minutes or less, sequela: Secondary | ICD-10-CM | POA: Diagnosis not present

## 2021-11-25 DIAGNOSIS — R569 Unspecified convulsions: Secondary | ICD-10-CM | POA: Diagnosis not present

## 2021-11-25 DIAGNOSIS — S069X0S Unspecified intracranial injury without loss of consciousness, sequela: Secondary | ICD-10-CM

## 2021-11-25 DIAGNOSIS — I10 Essential (primary) hypertension: Secondary | ICD-10-CM | POA: Diagnosis not present

## 2021-11-25 DIAGNOSIS — Z992 Dependence on renal dialysis: Secondary | ICD-10-CM | POA: Diagnosis not present

## 2021-11-25 DIAGNOSIS — N186 End stage renal disease: Secondary | ICD-10-CM | POA: Diagnosis not present

## 2021-11-25 LAB — GLUCOSE, CAPILLARY: Glucose-Capillary: 114 mg/dL — ABNORMAL HIGH (ref 70–99)

## 2021-11-25 MED ORDER — DARBEPOETIN ALFA 60 MCG/0.3ML IJ SOSY
60.0000 ug | PREFILLED_SYRINGE | INTRAMUSCULAR | Status: DC
  Administered 2021-11-25: 60 ug via SUBCUTANEOUS
  Filled 2021-11-25: qty 0.3

## 2021-11-25 MED ORDER — PREGABALIN 75 MG PO CAPS
75.0000 mg | ORAL_CAPSULE | Freq: Every day | ORAL | Status: DC
  Administered 2021-11-26 – 2021-11-29 (×4): 75 mg via ORAL
  Filled 2021-11-25 (×5): qty 1

## 2021-11-25 NOTE — Progress Notes (Signed)
Physical Therapy Session Note  Patient Details  Name: Kirk [name] MRN: [id number] Date of Birth: [birth year]  Today's Date: 11/25/2021 PT Individual Time: 4497-5300 PT Individual Time Calculation (min): 60 min   Short Term Goals: Week 1:  PT Short Term Goal 1 (Week 1): Pt will transfer to [facility] with CGA PT Short Term Goal 2 (Week 1): Pt will ambulate 123f with CGA PT Short Term Goal 3 (Week 1): Pt will propell WC >100 with supervision assist PT Short Term Goal 4 (Week 1): Pt will tolerate >2 hours out of bed between therapies Week 2:     Skilled Therapeutic Interventions/Progress Updates:   Pt received supine in bed and agreeable to PT. Supine>sit transfer without assist and cues. [facility]. Pt reports mild HA, but significantly improved from yesterday. Pt transported to entrance of [facility] Gait training with RW 2035f and [id number] with seated rest breaks between bouts. Pt reports mild increase in HA with gait, and patient reports increased dizziness with increased HA. HA reduces with rest breaks. Dynamic balance training with RW to perform eyes open/eyes closed 2 x 10 sec each with UE support as needed. Smeitandem 2 x 10 sec Bil without UE support. Performed sit<>stand 5 x 2 with UE support on RW from bench. SLS 10 sec hold x 2 BLE with BUE supported on RW. Cues for posture and gaze fixation to reduce dizziness. Patient returned to room and performed stand pivot to recliner with RW and supervision assist . Pt left sitting in recliner with call bell in reach and all needs met.          Therapy Documentation Precautions:  Precautions Precautions: Fall Precaution Comments: watch vitals, B knee buckling Restrictions Weight Bearing Restrictions: No  Vital Signs: Therapy Vitals Temp: 98 F (36.7 C) Temp Source: Oral Pulse Rate: (!) 56 Resp: 17 BP: (!) 133/54 Patient Position (if appropriate): Lying Oxygen Therapy O2 Device:  CPAP Pain: Pain Assessment Pain Score: 0-No pain   Therapy/Group: Individual Therapy  AusLorie Phenix/04/2021, 4:38 PM

## 2021-11-25 NOTE — Evaluation (Signed)
Speech Language Pathology Assessment and Plan  Patient Details  Name: Kirk [name] MRN: [id number] Date of Birth: December 08, 1960  SLP Diagnosis: Cognitive Impairments  Rehab Potential: Good ELOS: 2 weeks    Today's Date: 11/25/2021 SLP Individual Time: 5732-2025 and 1145-1230 SLP Individual Time Calculation (min): 45 min and 73 min   Hospital Problem: Principal Problem:   Traumatic subdural hematoma (SDH) ([place]) Active Problems:   Uncontrolled hypertension   ESRD on dialysis ([place])   Chronic systolic heart failure ([place])  Past Medical History:  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)    Anxiety    Asthma    Asthma    moderate persistent   Carpal tunnel syndrome    Cellulitis and abscess of trunk    Cholelithiasis 07/13/2014   Chronic headaches    Debility, unspecified    Dermatophytosis of the body    Dysrhythmia    history of   Edema    End stage renal disease on dialysis ([place])    "MWF; [facility] in [place]" (10/21/2014)   Essential hypertension, benign    GERD (gastroesophageal reflux disease)    Gout, unspecified    HTN (hypertension)    Hypertrophy of prostate without urinary obstruction and other lower urinary tract symptoms (LUTS)    Hypotension, unspecified    Impotence of organic origin    Insomnia, unspecified    Kidney replaced by transplant    Localization-related (focal) (partial) epilepsy and epileptic syndromes with complex partial seizures, without mention of intractable epilepsy    12-15-19- Wife states he has NEVER had a seizure    Lumbago    Memory loss    OSA on CPAP    Other and unspecified hyperlipidemia    controlled /managed per wife    Other chronic nonalcoholic liver disease    Other malaise and fatigue    Other nonspecific abnormal serum enzyme levels    Pain in joint, lower leg     Pain in joint, upper arm    Pneumonia "several times"   Renal dialysis status(V45.11) 02/05/2010   restarted 01/02/13 ofter renal trransplant failure   Secondary hyperparathyroidism (of renal origin)    Shortness of breath    Sleep apnea    wears cpap    Tension headache    Unspecified constipation    Unspecified essential hypertension    Unspecified hereditary and idiopathic peripheral neuropathy    Unspecified vitamin D deficiency    Past Surgical History:  Past Surgical History:  Procedure Laterality Date   AV FISTULA PLACEMENT Left ?2010   "forearm; at [place]"   [place]  03/21/2011   CHOLECYSTECTOMY N/A 10/21/2014   Procedure: LAPAROSCOPIC CHOLECYSTECTOMY WITH INTRAOPERATIVE CHOLANGIOGRAM;  Surgeon: Autumn Messing III, MD;  Location: [place];  Service: General;  Laterality: N/A;   COLONOSCOPY     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   KIDNEY TRANSPLANT  08/17/2011   [place]  10/21/2014   w/IOC   LEFT HEART CATHETERIZATION WITH CORONARY ANGIOGRAM N/A 03/21/2011   Procedure: LEFT HEART CATHETERIZATION WITH CORONARY ANGIOGRAM;  Surgeon: [name], MD;  Location: [facility] CATH LAB;  Service: Cardiovascular;  Laterality: N/A;   NEPHRECTOMY  08/2013   removed transplaned kidney  POLYPECTOMY     POSTERIOR FUSION CERVICAL SPINE  06/25/2012   for spinal stenosis   VASECTOMY  2010    Assessment / Plan / Recommendation Clinical Impression  Kirk [name] is a 61 year old right-handed male with history of end-stage renal disease in setting of [place] syndrome status post nephrectomy, memory impairment, chronic diastolic congestive heart failure, obesity with BMI 32.49, hypertension, chronic thrombocytopenia, anxiety, chronic headaches, OSA on CPAP, quit smoking 20 months ago, chronic bilateral upper extremity lower extremity weakness with history of posterior cervical fusion 07/11/2012 and  03/13/2021.  Per chart review patient lives with spouse and family.  1 level home 3 steps to entry.  Reportedly independent with history of frequent falls and was driving short distances.  Wife can provide assistance as needed.  Presented 11/07/2021 after reported seizure found by his wife lasting approximately 2 minutes.  There has been reports of headache for the past few days.  Admission chemistries unremarkable except glucose 149 BUN 40 creatinine 11.66, hemoglobin 10.1.  EEG no seizure or epileptiform discharge were seen.  Echocardiogram with ejection fraction of 50 to 55% no wall motion abnormalities grade 1 diastolic dysfunction.  Cranial CT scan showed acute subdural hematoma overlying the right frontal convexity measuring 9 mm in greatest radial diameter.  Mild adjacent mass effect and 1-2 mm of leftward midline shift at the septum [place].  Follow-up neurosurgery Dr. 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  Additional smaller subdural collections along the left cerebral convexity, along the right tentorial leaflet, and along the right inferior temporal lobe measuring up to 5 mm.  Hospital course follow-up nephrology services hemodialysis ongoing as indicated.  Maintained on Keppra for seizure prophylaxis.  Patient did spike a low-grade fever 100.910/24 concern for SIRS placed on vancomycin cefepime and Flagyl.  Lactic acid level was normal procalcitonin 0.58 WBC normal chest x-ray suggestive of interstitial edema rather than infectious infiltrate.  2 out of 4 blood cultures from 10/25 positive for staph epidermidis likely contaminant he did complete course of antibiotic therapy no further fever noted.  On the afternoon of 11/15/2021 return from dialysis noted to be  tachycardic EKG atrial fibrillation with RVR and received a dose of Cardizem converting back to sinus rhythm.  It was felt atrial fibrillation likely provoked due to inability to take medications prior to dialysis.  He was not a candidate for anticoagulation due to hematoma.  Patient is tolerating a regular consistency diet.  Therapy evaluations completed due to patient decreased functional mobility was admitted for a comprehensive rehab program. 11/2 pt demonstrated seizure activity in PT session. CT/MRI noted increase in size of right hemisphere hematoma. ST evaluation ordered due to UE numbness, slurred speech and decrease in awareness of deficits.   Pt presents with severe deficits in alertness impacting, sustained attention, short term recall and judgment. Pt required multimodal cues for alertness in 1 minute to 30 second intervals as pt was drift to sleep. Pt even drifted to sleep x3 when attempting to eat lunch on the side of the bed, SLP prevented falls. SLP upgraded supervision to full due to reduced alertness and instructed nursing staff to not leave pt on side of bed. SLP facilitated cognitive linguistic assessment administering Cognistat, pt scored moderate impairments in memory and mild impairments in judgment/attention. Pt supported reduced  judgement last night, not utilizing call bell for CPAP to be brought to within reach. Pt is hard of hearing (right side mostly impaired), SLP educated pt on clarification communication repeating directions/questions and provided hearing amplifiers. Pt demonstrates no impairments in misarticulation with amplifiers in place, SLP suggest speech deficits due to fatigue and hearing impairment verse dysarthria. Pt would benefit from skilled ST services in order to maximize functional independence and reduce burden of care requiring 24 hour supervision and continued ST services.   Skilled Therapeutic Interventions          Skilled ST services focused on cognitive  skills. SLP administered cognitive linguistic assessment, broke sessions into two due to reduced alertness, provided hearing amplifiers, cues for alertness, strategies to repeat directions to clarify auditory comprehension and communicated with nurse pertaining to full supervision with meals due to reduced alertness. Pt was left in room with nurse. Recommend to continue ST services.    SLP Assessment  Patient will need skilled [facility] during CIR admission    Recommendations  Patient destination: Home Follow up Recommendations: 24 hour supervision/assistance;Outpatient SLP;Home Health SLP Equipment Recommended: None recommended by SLP    SLP Frequency 3 to 5 out of 7 days   SLP Duration  SLP Intensity  SLP Treatment/Interventions 2 weeks  Minumum of 1-2 x/day, 30 to 90 minutes  Cognitive remediation/compensation;Cueing hierarchy;Functional tasks;Patient/family education;Internal/external aids    Pain Pain Assessment Pain Score: 0-No pain  Prior Functioning Cognitive/Linguistic Baseline: Information not available Type of Home: House  Lives With: Spouse Available Help at Discharge: Family;Available 24 hours/day Vocation: On disability  SLP Evaluation Cognition Overall Cognitive Status: Impaired/Different from baseline Arousal/Alertness: Awake/alert Orientation Level: Oriented X4 Memory: Impaired Memory Impairment: Storage deficit;Decreased long term memory;Decreased short term memory;Decreased recall of new information Awareness: Impaired Awareness Impairment: Intellectual impairment;Other (comment);Anticipatory impairment (safety) Problem Solving: Impaired Problem Solving Impairment: Verbal complex;Functional complex Safety/Judgment: Impaired  Comprehension Auditory Comprehension Overall Auditory Comprehension: Appears within functional limits for tasks assessed (hard of hearing) Expression Expression Primary Mode of Expression:  Verbal Verbal Expression Overall Verbal Expression: Appears within functional limits for tasks assessed Oral Motor Oral Motor/Sensory Function Overall Oral Motor/Sensory Function: Within functional limits Motor Speech Overall Motor Speech: Appears within functional limits for tasks assessed  Care Tool Care Tool Cognition Ability to hear (with hearing aid or hearing appliances if normally used Ability to hear (with hearing aid or hearing appliances if normally used): 2. Moderate difficulty - speaker has to increase volume and speak distinctly   Expression of Ideas and Wants Expression of Ideas and Wants: 3. Some difficulty - exhibits some difficulty with expressing needs and ideas (e.g, some words or finishing thoughts) or speech is not clear   Understanding Verbal and Non-Verbal Content Understanding Verbal and Non-Verbal Content: 3. Usually understands - understands most conversations, but misses some part/intent of message. Requires cues at times to understand  Memory/Recall Ability Memory/Recall Ability : Current season;That he or she is in a hospital/hospital unit   Short Term Goals: Week 1: SLP Short Term Goal 1 (Week 1): Pt will demonstrate sustained attention/alertness in 1 minute interval with mod-min A verbal cues for redirection in 80% of interactions, SLP Short Term Goal 2 (Week 1): Pt will demonstrate recall of daily information with use of memory notebook with supervision A verbal cues. SLP Short Term Goal 3 (Week 1): Pt will demonstrate recall of daily information with use of internal strategies with mod-min A verbal cues. SLP Short Term Goal 4 (Week  1): Pt will demonstrate increase in safety awareness/judgement skills in fucntional scenarios with supervision A verbal cues.  Refer to Care Plan for Long Term Goals  Recommendations for other services: None   Discharge Criteria: Patient will be discharged from SLP if patient refuses treatment 3 consecutive times without medical  reason, if treatment goals not met, if there is a change in medical status, if patient makes no progress towards goals or if patient is discharged from hospital.  The above assessment, treatment plan, treatment alternatives and goals were discussed and mutually agreed upon: by patient and by family  Tahsin Benyo  [facility] 11/25/2021, 3:18 PM

## 2021-11-25 NOTE — Progress Notes (Signed)
[facility] Progress Note   Subjective:  Seen during PT session - looks remarkably improved from yesterday - perfectly alert, no slurred speech. Denies CP or dyspnea, still with headache.  Objective Vitals:   11/25/21 0450 11/25/21 0452 11/25/21 0453 11/25/21 0928  BP: (!) 148/53 139/61 (!) 142/58 (!) 174/64  Pulse: 63 67 69   Resp: '18 18 18   '$ Temp: 98.7 F (37.1 C)     TempSrc: Oral     SpO2: 99% 98% 99%   Weight:      Height:       Physical Exam General: Chronically ill appearing man, NAD. Room air.  Heart: RRR; no murmur Lungs: CTAB; no rales Abdomen: soft Extremities: no LE edema Dialysis Access:  L AVF + thrill  Additional Objective Labs: Basic Metabolic Panel: Recent Labs  Lab 11/19/21 0326 11/24/21 1610  NA 130* 130*  K 3.9 5.0  CL 91* 93*  CO2 25 26  GLUCOSE 102* 103*  BUN 32* 39*  CREATININE 10.51* 11.57*  CALCIUM 9.1 8.9  PHOS 5.7* 4.3   Liver Function Tests: Recent Labs  Lab 11/19/21 0326 11/24/21 1610  ALBUMIN 3.1* 3.1*   CBC: Recent Labs  Lab 11/19/21 0326 11/21/21 0321 11/24/21 1610  WBC 3.2* 4.7 3.5*  HGB 8.3* 8.3* 7.5*  HCT 24.5* 26.0* 22.3*  MCV 90.7 94.2 93.7  PLT 74* 101* 68*   Studies/Results: EEG adult  Result Date: 11/24/2021 [name], MD     11/24/2021 11:38 AM Patient Name: Kirk [name] MRN: [id number] Epilepsy Attending: [name] Referring Physician/Provider: Kerney Elbe, MD Date: 11/24/2021 Duration: 24.37 mins Patient history: 61 year old dialysis patient with acute right SDH with new onset seizure presenting on 10/17. Patient with new onset left-sided neglect and right forced gaze on 10/24.  EEG to evaluate for seizure Level of alertness: Awake, asleep AEDs during EEG study: LEV, PGB Technical aspects: This EEG study was done with scalp electrodes positioned according to the 10-20 International system of electrode placement. Electrical activity was reviewed with band pass filter of 1-'70Hz'$ ,  sensitivity of 7 uV/mm, display speed of 33m/sec with a '60Hz'$  notched filter applied as appropriate. EEG data were recorded continuously and digitally stored.  Video monitoring was available and reviewed as appropriate. Description: The posterior dominant rhythm consists of 8 Hz activity of moderate voltage (25-35 uV) seen predominantly in posterior head regions, symmetric and reactive to eye opening and eye closing. Sleep was characterized by vertex waves, sleep spindles (12 to 14 Hz), maximal frontocentral region. EEG showed near continuous 3 to 6 Hz theta-delta slowing in right parieto-occipital region. Hyperventilation and photic stimulation were not performed.   ABNORMALITY - Continuous slow, right parieto-occipital region IMPRESSION: This study is suggestive of cortical dysfunction arising from right parieto-occipital region likely secondary to underlying SDH. No seizures or definite epileptiform discharges were seen throughout the recording. [name]  CT HEAD WO CONTRAST (5MM)  Result Date: 11/24/2021 CLINICAL DATA:  Headache history of subdural hematoma. EXAM: CT HEAD WITHOUT CONTRAST TECHNIQUE: Contiguous axial images were obtained from the base of the skull through the vertex without intravenous contrast. RADIATION DOSE REDUCTION: This exam was performed according to the departmental dose-optimization program which includes automated exposure control, adjustment of the mA and/or kV according to patient size and/or use of iterative reconstruction technique. COMPARISON:  Brain MRI 11/16/2021, CT head 11/14/2021 FINDINGS: Brain: The mixed density but primarily hypodense right cerebral convexity subdural hematoma measures up to 1.3  cm in maximal thickness in the coronal plane (5-44), increased in size compared to the head CT from 11/14/2021 when it measured up to 1.0 cm. There is likely trace new/acute blood within the collection (5-34). There is mass effect on the underlying brain parenchyma with  sulcal effacement, partial effacement of the right lateral ventricle, and trace leftward midline shift. The smaller left-sided subdural seen on the prior MRI is not seen on the current study. There is no other acute intracranial hemorrhage. There is no acute territorial infarct. Background parenchymal volume is normal. The ventricles are otherwise normal in size. Gray-white differentiation is preserved. There is no solid mass lesion. Vascular: No hyperdense vessel or unexpected calcification. Skull: Normal. Negative for fracture or focal lesion. Sinuses/Orbits: The paranasal sinuses are clear. The globes and orbits are unremarkable. Other: None. IMPRESSION: Slight interval increase in size of the mixed density subacute right cerebral convexity subdural hematoma now measuring up to 1.3 cm (previously measured 1.0 cm), with trace new/acute blood and mild mass effect and trace leftward midline shift. Electronically Signed   By: Valetta Mole M.D.   On: 11/24/2021 09:00   Medications:  levETIRAcetam Stopped (11/24/21 1848)   levETIRAcetam      amLODipine  10 mg Oral QPM   calcitRIOL  1 mcg Oral Q M,W,F-HD   Chlorhexidine Gluconate Cloth  6 each Topical Q0600   cholecalciferol  4,000 Units Oral Daily   cinacalcet  60 mg Oral Once per day on Mon Wed Fri   doxazosin  4 mg Oral Daily   guaiFENesin  1,200 mg Oral BID   hydrALAZINE  100 mg Oral Q8H   isosorbide mononitrate  60 mg Oral Daily   losartan  100 mg Oral QPM   methocarbamol  750 mg Oral QID   metoprolol tartrate  100 mg Oral BID   multivitamin  1 tablet Oral Daily   pantoprazole  40 mg Oral BID AC   pravastatin  40 mg Oral Daily   pregabalin  50 mg Oral Daily   senna-docusate  1 tablet Oral BID   sevelamer carbonate  3,200 mg Oral TID WC   sucralfate  1 g Oral TID WC & HS   traZODone  50 mg Oral QHS    Dialysis Orders: MWF at [facility] 4:15hr, 4500/A1.5, EDW 89.9kg, 2K/2Ca bath, UFP #4, LUE AVF, heparin 2500 unit bolus -> NOW STOPPED - Sensipar  '60mg'$  PO q HD - Calcitriol 1.22mg PO q HD   Assessment/Plan: New onset seizures: D/t R SDH per neuro. On keppra -> changed from PO to IV and post-HD supplement added. Subdural hematoma: Nsg consulted, recommended conservative management. Now with enlarging bleed on 11/3 CT - awaiting further recommendations, if any. ESRD: Continue HD on MWF - next HD Monday 11/6, no heparin. HTN/volume: On 5 anti-HTN meds and BP remains high, although much improved today and closer to EDW. Anemia of ESRD: Hgb 7.5 - resuming ESA today since BP improved. Secondary HPTH: Ca/Phos good. Continue Renvela/VDRA, resumed Sensipar. OSA on C-pap Chronic thrombocytopenia Hx A-fib with RVR 10/25 - rate controlled now, on metoprolol BID. Dispo: Unclear at this time.    KVeneta Penton PA-C 11/25/2021, 9:34 AM  [employer]

## 2021-11-25 NOTE — Progress Notes (Signed)
PROGRESS NOTE   Subjective/Complaints: Reports headache along right temple, sometimes cerivcal-occipital. No seizure like activity  ROS: Patient denies fever, rash, sore throat, blurred vision, dizziness, nausea, vomiting, diarrhea, cough, shortness of breath or chest pain, joint or back/neck pain, headache, or mood change.   Objective:   EEG adult  Result Date: 11/24/2021 Kirk Havens, MD     11/24/2021 11:38 AM Patient Name: Kirk [name] MRN: [id number] Epilepsy Attending: Lora [name] Referring Physician/Provider: Kerney Elbe, MD Date: 11/24/2021 Duration: 24.37 mins Patient history: 61 year old dialysis patient with acute right SDH with new onset seizure presenting on 10/17. Patient with new onset left-sided neglect and right forced gaze on 10/24.  EEG to evaluate for seizure Level of alertness: Awake, asleep AEDs during EEG study: LEV, PGB Technical aspects: This EEG study was done with scalp electrodes positioned according to the 10-20 International system of electrode placement. Electrical activity was reviewed with band pass filter of 1-'70Hz'$ , sensitivity of 7 uV/mm, display speed of 61m/sec with a '60Hz'$  notched filter applied as appropriate. EEG data were recorded continuously and digitally stored.  Video monitoring was available and reviewed as appropriate. Description: The posterior dominant rhythm consists of 8 Hz activity of moderate voltage (25-35 uV) seen predominantly in posterior head regions, symmetric and reactive to eye opening and eye closing. Sleep was characterized by vertex waves, sleep spindles (12 to 14 Hz), maximal frontocentral region. EEG showed near continuous 3 to 6 Hz theta-delta slowing in right parieto-occipital region. Hyperventilation and photic stimulation were not performed.   ABNORMALITY - Continuous slow, right parieto-occipital region IMPRESSION: This study is suggestive of cortical dysfunction  arising from right parieto-occipital region likely secondary to underlying SDH. No seizures or definite epileptiform discharges were seen throughout the recording. [name]  CT HEAD WO CONTRAST (5MM)  Result Date: 11/24/2021 CLINICAL DATA:  Headache history of subdural hematoma. EXAM: CT HEAD WITHOUT CONTRAST TECHNIQUE: Contiguous axial images were obtained from the base of the skull through the vertex without intravenous contrast. RADIATION DOSE REDUCTION: This exam was performed according to the departmental dose-optimization program which includes automated exposure control, adjustment of the mA and/or kV according to patient size and/or use of iterative reconstruction technique. COMPARISON:  Brain MRI 11/16/2021, CT head 11/14/2021 FINDINGS: Brain: The mixed density but primarily hypodense right cerebral convexity subdural hematoma measures up to 1.3 cm in maximal thickness in the coronal plane (5-44), increased in size compared to the head CT from 11/14/2021 when it measured up to 1.0 cm. There is likely trace new/acute blood within the collection (5-34). There is mass effect on the underlying brain parenchyma with sulcal effacement, partial effacement of the right lateral ventricle, and trace leftward midline shift. The smaller left-sided subdural seen on the prior MRI is not seen on the current study. There is no other acute intracranial hemorrhage. There is no acute territorial infarct. Background parenchymal volume is normal. The ventricles are otherwise normal in size. Gray-white differentiation is preserved. There is no solid mass lesion. Vascular: No hyperdense vessel or unexpected calcification. Skull: Normal. Negative for fracture or focal lesion. Sinuses/Orbits: The paranasal sinuses are clear. The globes and orbits are  unremarkable. Other: None. IMPRESSION: Slight interval increase in size of the mixed density subacute right cerebral convexity subdural hematoma now measuring up to 1.3 cm  (previously measured 1.0 cm), with trace new/acute blood and mild mass effect and trace leftward midline shift. Electronically Signed   By: Valetta Mole M.D.   On: 11/24/2021 09:00   Recent Labs    11/24/21 1610  WBC 3.5*  HGB 7.5*  HCT 22.3*  PLT 68*   Recent Labs    11/24/21 1610  NA 130*  K 5.0  CL 93*  CO2 26  GLUCOSE 103*  BUN 39*  CREATININE 11.57*  CALCIUM 8.9    Intake/Output Summary (Last 24 hours) at 11/25/2021 0928 Last data filed at 11/25/2021 0806 Gross per 24 hour  Intake 580.02 ml  Output 4000 ml  Net -3419.98 ml        Physical Exam: Vital Signs Blood pressure (!) 174/64, pulse 69, temperature 98.7 F (37.1 C), temperature source Oral, resp. rate 18, height '5\' 7"'$  (1.702 m), weight 92.6 kg, SpO2 99 %.   Constitutional: No distress . Vital signs reviewed. HEENT: NCAT, EOMI, oral membranes moist Neck: supple Cardiovascular: RRR without murmur. No JVD    Respiratory/Chest: CTA Bilaterally without wheezes or rales. Normal effort    GI/Abdomen: BS +, non-tender, non-distended Ext: no clubbing, cyanosis, or edema Psych: pleasant and cooperative, a little anxious Skin: No evidence of breakdown, no evidence of rash. LUE AVG Neurologic: Cranial nerves II through XII intact, motor strength is 5/5 in bilateral deltoid, bicep, tricep, grip, hip flexor, knee extensors, ankle dorsiflexor and plantar flexor Sensory exam normal sensation to light touch and proprioception in bilateral upper and lower extremities Cerebellar exam normal finger to nose to finger as well as heel to shin in bilateral upper and lower extremities Good sitting balance Musculoskeletal: Full range of motion in all 4 extremities. No joint swelling   Assessment/Plan: 1. Functional deficits which require 3+ hours per day of interdisciplinary therapy in a comprehensive inpatient rehab setting. Physiatrist is providing close team supervision and 24 hour management of active medical problems  listed below. Physiatrist and rehab team continue to assess barriers to discharge/monitor patient progress toward functional and medical goals  Care Tool:  Bathing    Body parts bathed by patient: Right arm, Left arm, Chest, Abdomen, Front perineal area, Buttocks, Right upper leg, Left upper leg, Face, Left lower leg, Right lower leg         Bathing assist Assist Level: Supervision/Verbal cueing     Upper Body Dressing/Undressing Upper body dressing   What is the patient wearing?: Pull over shirt    Upper body assist Assist Level: Supervision/Verbal cueing    Lower Body Dressing/Undressing Lower body dressing      What is the patient wearing?: Underwear/pull up, Pants     Lower body assist Assist for lower body dressing: Supervision/Verbal cueing     Toileting Toileting    Toileting assist Assist for toileting: Supervision/Verbal cueing     Transfers Chair/bed transfer  Transfers assist     Chair/bed transfer assist level: Contact Guard/Touching assist     Locomotion Ambulation   Ambulation assist      Assist level: Moderate Assistance - Patient 50 - 74% Assistive device: Hand held assist Max distance: 55   Walk 10 feet activity   Assist     Assist level: Moderate Assistance - Patient - 50 - 74% Assistive device: Hand held assist   Walk 50 feet activity  Assist    Assist level: Moderate Assistance - Patient - 50 - 74%      Walk 150 feet activity   Assist Walk 150 feet activity did not occur: Safety/medical concerns         Walk 10 feet on uneven surface  activity   Assist Walk 10 feet on uneven surfaces activity did not occur: Safety/medical concerns         Wheelchair     Assist Is the patient using a wheelchair?: Yes Type of Wheelchair: Manual    Wheelchair assist level: Minimal Assistance - Patient > 75% Max wheelchair distance: 50    Wheelchair 50 feet with 2 turns activity    Assist         Assist Level: Minimal Assistance - Patient > 75%   Wheelchair 150 feet activity     Assist      Assist Level: Maximal Assistance - Patient 25 - 49%   Blood pressure (!) 174/64, pulse 69, temperature 98.7 F (37.1 C), temperature source Oral, resp. rate 18, height '5\' 7"'$  (1.702 m), weight 92.6 kg, SpO2 99 %.    Medical Problem List and Plan: 1. Functional deficits secondary to subdural hematoma traumatic following seizure Unclear whether brief 15-20s episode yesterday was seizure or BP related  Keppra level on low side of therapeutic range              -patient may  shower             -ELOS/Goals: min A 12-14 days  -Continue CIR therapies including PT, OT, and SLP   2.  Antithrombotics: -DVT/anticoagulation:  Mechanical: Antiembolism stockings, thigh (TED hose) Bilateral lower extremities             -antiplatelet therapy: N/A 3. Pain Management: Oxycodone/Robaxin as needed Chronic HA on LYrica   11/4 pt reports increased H/A for several days  -will increase lyrica to '75mg'$  QD 4. Mood/Behavior/Sleep/history of memory loss: Trazodone 50 mg nightly             -antipsychotic agents: N/A 5. Neuropsych/cognition: This patient is capable of making decisions on his own behalf. 6. Skin/Wound Care: Routine skin checks 7. Fluids/Electrolytes/Nutrition: Routine in and outs with follow-up chemistries 8.  End-stage renal disease.  Continue hemodialysis as directed- is basically anuric- forms 1 oz/urine/day at most.  9.  Chronic headaches.  Improved Lyrica 50 mg daily- ? For prevention- not getting any results so far with Lyrica 10.  Seizure prophylaxis.  Continue Keppra as indicated by neurology. Increased levetiracetam to '750mg'$  per day and extra '250mg'$  on HD days, recheck level Monday  -11/4 no new episodes reported  -CT with some evolution. EEG with slowing at site of injury 11.  PAF with RVR.  Not a candidate for anticoagulation.  Lopressor 100 mg twice daily 12.  Hypertension.   Cardura 4 mg daily, Cozaar 100 mg daily, hydralazine 100 mg every 8 hours, Imdur 60 mg daily.  Monitor with increased mobility Vitals:   11/25/21 0453 11/25/21 0928  BP: (!) 142/58 (!) 174/64  Pulse: 69   Resp: 18   Temp:    SpO2: 99%    Overall improved appreciate nephro help, HD MWF  13.  Hyperlipidemia.  Pravachol 14.  Anemia of chronic disease.  Follow-up CBC 15.  GERD.  Protonix/Carafate 16.  Chronic diastolic congestive heart failure.  Monitor for any signs of fluid overload 17.  Obesity.  BMI 32.49.  Dietary follow-up 18.  OSA.  CPAP 19.  CHronic hearing issues but not severe , likely not Al[place] syndrome , denies having cochlear implant , none seen of CT head  20.  Systolic heart murmur with ECHO demonstrating aortic stenosis.   LOS: 4 days A FACE TO FACE EVALUATION WAS PERFORMED  Meredith Staggers 11/25/2021, 9:28 AM

## 2021-11-25 NOTE — Progress Notes (Signed)
Occupational Therapy Session Note  Patient Details  Name: Kirk [name] MRN: [id number] Date of Birth: January 13, 1961  Today's Date: 11/25/2021 OT Individual Time: 0845-1000 OT Individual Time Calculation (min): 75 min    Short Term Goals: Week 1:  OT Short Term Goal 1 (Week 1): Pt will be able to ambulate to toilet with LRAD with light CGA. OT Short Term Goal 2 (Week 1): Pt will demonstrate improved standing balance with donning pants with close S with no posterior lean. OT Short Term Goal 3 (Week 1): Pt will learn gentle self ROM exercises he can do for his B shoulders.  Skilled Therapeutic Interventions/Progress Updates:    Upon OT arrival, pt semi recumbent in bed with RN present. Pt reports headache but otherwise no pain. Pt agreeable to OT treatment. Treatment intervention with a focus on self care retraining, use of AE, functional mobility, strengthening, and endurance. Pt's BP read 154/64 at rest. Pt completes supine to sit transfer with SBA and donns shoes with Supervision seated EOB. Pt completes stand pivot transfer with SBA using RW into w/c. Pt was transported to main therapy gym via w/c and total A. Pt completes sit to stand transfer with SBA using RW and ambulates around gym to retrieve bean bags from the floor. Pt completes with SBA and has no knee buckling during task. Pt returns to w/c with SBA for seated rest break. Pt was transported to dayroom via w/c and total A and transfers onto NUSTEP with SBA and no AD. Pt begins performing NUSTEP for 3 minutes and has complaints of dizziness and fatigue. BP was taken a second time reading 174/64. Pt was transported to nurses station, RN was notified and medication provided to lower BP. Pt willing to continue therapy. Pt was transported back to dayroom via w/c and total A and sits at tabletop with 1.5lb wrist weights donned. Pt flips over [employer] and sorts based on color and shape. Pt had no difficulty but requires increased time.  Game pieces were separated on each side of table and pt crosses midline to retrieve game pieces one a time, perform an arc motion and place into game bag. Pt requires short rest breaks to returns half of game pieces. Pt reports dizziness and was instructed to keep eyes open and fixate on something. Pt completes other half of game pieces once resolved and completes quickly. Pt was returned to his room via w/c and total A and completes stand pivot transfer to bed with CGA. Pt completes sit to supine transfer with SBA and was left in bed at end of session with all needs met and safety measures in place.   Therapy Documentation Precautions:  Precautions Precautions: Fall Precaution Comments: watch vitals, B knee buckling Restrictions Weight Bearing Restrictions: No    Therapy/Group: Individual Therapy  Marvetta Gibbons 11/25/2021, 10:32 AM

## 2021-11-26 DIAGNOSIS — I132 Hypertensive heart and chronic kidney disease with heart failure and with stage 5 chronic kidney disease, or end stage renal disease: Secondary | ICD-10-CM | POA: Diagnosis not present

## 2021-11-26 DIAGNOSIS — N186 End stage renal disease: Secondary | ICD-10-CM | POA: Diagnosis not present

## 2021-11-26 DIAGNOSIS — N2581 Secondary hyperparathyroidism of renal origin: Secondary | ICD-10-CM | POA: Diagnosis not present

## 2021-11-26 DIAGNOSIS — Z992 Dependence on renal dialysis: Secondary | ICD-10-CM | POA: Diagnosis not present

## 2021-11-26 DIAGNOSIS — D631 Anemia in chronic kidney disease: Secondary | ICD-10-CM | POA: Diagnosis not present

## 2021-11-26 DIAGNOSIS — R569 Unspecified convulsions: Secondary | ICD-10-CM | POA: Diagnosis not present

## 2021-11-26 NOTE — Progress Notes (Signed)
[facility] Progress Note   Subjective:  Seen in room - no CP/dyspnea. Therapy is going well. Headache is less today.   Objective Vitals:   11/25/21 0928 11/25/21 1330 11/25/21 1925 11/26/21 0346  BP: (!) 174/64 (!) 133/54 (!) 145/75 (!) 132/56  Pulse:  (!) 56 63 66  Resp:  '17 18 18  '$ Temp:  98 F (36.7 C) 98.2 F (36.8 C) 98.1 F (36.7 C)  TempSrc:  Oral Oral Oral  SpO2:   100% 98%  Weight:      Height:       Physical Exam General: More well appearing man, NAD. Room air.  Heart: RRR; no murmur Lungs: CTAB; no rales Abdomen: soft Extremities: no LE edema Dialysis Access:  L AVF + thrill/bruit with slight whistle   Additional Objective Labs: Basic Metabolic Panel: Recent Labs  Lab 11/24/21 1610  NA 130*  K 5.0  CL 93*  CO2 26  GLUCOSE 103*  BUN 39*  CREATININE 11.57*  CALCIUM 8.9  PHOS 4.3   Liver Function Tests: Recent Labs  Lab 11/24/21 1610  ALBUMIN 3.1*   CBC: Recent Labs  Lab 11/21/21 0321 11/24/21 1610  WBC 4.7 3.5*  HGB 8.3* 7.5*  HCT 26.0* 22.3*  MCV 94.2 93.7  PLT 101* 68*   Studies/Results: EEG adult  Result Date: 11/24/2021 [name], MD     11/24/2021 11:38 AM Patient Name: MAURION WALKOWIAK MRN: [id number] Epilepsy Attending: [name] Referring Physician/Provider: Kerney Elbe, MD Date: 11/24/2021 Duration: 24.37 mins Patient history: 61 year old dialysis patient with acute right SDH with new onset seizure presenting on 10/17. Patient with new onset left-sided neglect and right forced gaze on 10/24.  EEG to evaluate for seizure Level of alertness: Awake, asleep AEDs during EEG study: LEV, PGB Technical aspects: This EEG study was done with scalp electrodes positioned according to the 10-20 International system of electrode placement. Electrical activity was reviewed with band pass filter of 1-'70Hz'$ , sensitivity of 7 uV/mm, display speed of 47m/sec with a '60Hz'$  notched filter applied as appropriate. EEG data were  recorded continuously and digitally stored.  Video monitoring was available and reviewed as appropriate. Description: The posterior dominant rhythm consists of 8 Hz activity of moderate voltage (25-35 uV) seen predominantly in posterior head regions, symmetric and reactive to eye opening and eye closing. Sleep was characterized by vertex waves, sleep spindles (12 to 14 Hz), maximal frontocentral region. EEG showed near continuous 3 to 6 Hz theta-delta slowing in right parieto-occipital region. Hyperventilation and photic stimulation were not performed.   ABNORMALITY - Continuous slow, right parieto-occipital region IMPRESSION: This study is suggestive of cortical dysfunction arising from right parieto-occipital region likely secondary to underlying SDH. No seizures or definite epileptiform discharges were seen throughout the recording. [name]   Medications:  levETIRAcetam Stopped (11/24/21 1848)   levETIRAcetam 750 mg (11/26/21 0839)    amLODipine  10 mg Oral QPM   calcitRIOL  1 mcg Oral Q M,W,F-HD   cholecalciferol  4,000 Units Oral Daily   cinacalcet  60 mg Oral Once per day on Mon Wed Fri   darbepoetin (ARANESP) injection - DIALYSIS  60 mcg Subcutaneous Q Sat-1800   doxazosin  4 mg Oral Daily   guaiFENesin  1,200 mg Oral BID   hydrALAZINE  100 mg Oral Q8H   isosorbide mononitrate  60 mg Oral Daily   losartan  100 mg Oral QPM   methocarbamol  750 mg Oral QID  metoprolol tartrate  100 mg Oral BID   multivitamin  1 tablet Oral Daily   pantoprazole  40 mg Oral BID AC   pravastatin  40 mg Oral Daily   pregabalin  75 mg Oral Daily   senna-docusate  1 tablet Oral BID   sevelamer carbonate  3,200 mg Oral TID WC   sucralfate  1 g Oral TID WC & HS   traZODone  50 mg Oral QHS    Dialysis Orders: MWF at [facility] 4:15hr, 450/A1.5, EDW 89.9kg, 2K/2Ca bath, UFP #4, LUE AVF, heparin 2500 unit bolus -> NOW STOPPED - Sensipar '60mg'$  PO q HD - Calcitriol 1.3mg PO q HD   Assessment/Plan: New  onset seizures: D/t R SDH per neuro. On keppra -> changed from PO to IV and post-HD supplement added. Subdural hematoma: Nsg consulted, recommended conservative management. Enlarging bleed on 11/3 CT - awaiting further recommendations, if any. ESRD: Continue HD on MWF - next HD Monday 11/6, no heparin. HTN/volume: On 5 anti-HTN meds, BP improved with volume down slightly - still up from EDW. Anemia of ESRD: Hgb 7.5 - Aranesp 654m given 11/4. WBC/plts down on last check - awaiting repeat. Secondary HPTH: Ca/Phos good. Continue Renvela/VDRA, resumed Sensipar. OSA on C-pap Chronic thrombocytopenia Hx A-fib with RVR 10/25 - rate controlled now, on metoprolol BID. Dispo: Unclear at this time.    KaVeneta PentonPA-C 11/26/2021, 9:28 AM  [employer]

## 2021-11-27 DIAGNOSIS — N186 End stage renal disease: Secondary | ICD-10-CM | POA: Diagnosis not present

## 2021-11-27 DIAGNOSIS — R569 Unspecified convulsions: Secondary | ICD-10-CM | POA: Diagnosis not present

## 2021-11-27 DIAGNOSIS — I132 Hypertensive heart and chronic kidney disease with heart failure and with stage 5 chronic kidney disease, or end stage renal disease: Secondary | ICD-10-CM | POA: Diagnosis not present

## 2021-11-27 DIAGNOSIS — D631 Anemia in chronic kidney disease: Secondary | ICD-10-CM | POA: Diagnosis not present

## 2021-11-27 DIAGNOSIS — S065X1D Traumatic subdural hemorrhage with loss of consciousness of 30 minutes or less, subsequent encounter: Secondary | ICD-10-CM | POA: Diagnosis not present

## 2021-11-27 DIAGNOSIS — N2581 Secondary hyperparathyroidism of renal origin: Secondary | ICD-10-CM | POA: Diagnosis not present

## 2021-11-27 DIAGNOSIS — Z992 Dependence on renal dialysis: Secondary | ICD-10-CM | POA: Diagnosis not present

## 2021-11-27 LAB — LEVETIRACETAM LEVEL: Levetiracetam Lvl: 16.4 ug/mL (ref 10.0–40.0)

## 2021-11-27 NOTE — Progress Notes (Addendum)
Speech Language Pathology Daily Session Note  Patient Details  Name: Kirk [name] MRN: [id number] Date of Birth: 11/02/1960  Today's Date: 11/27/2021 SLP Individual Time: 1005-1045 SLP Individual Time Calculation (min): 40 min  Short Term Goals: Week 1: SLP Short Term Goal 1 (Week 1): Pt will demonstrate sustained attention/alertness in 1 minute interval with mod-min A verbal cues for redirection in 80% of interactions. - Pt maintained alertness/attention for ~3 minute intervals with MIN A verbal cues for 85% of interactions.   SLP Short Term Goal 2 (Week 1): Pt will demonstrate recall of daily information with use of memory notebook with supervision A verbal cues. - Did not formally address this session.  SLP Short Term Goal 3 (Week 1): Pt will demonstrate recall of daily information with use of internal strategies with mod-min A verbal cues. - Pt recalled time of morning medication doses, AM meal, walking with PT this AM, and upcoming dialysis with supervision A.   SLP Short Term Goal 4 (Week 1): Pt will demonstrate increase in safety awareness/judgement skills in functional scenarios with supervision A verbal cues. - Supervision A for verbal problem-solving with photo cards on 4 out of 4 opportunities. MIN A verbal cues for use of call bell to notify RN of headache and nausea.  Skilled Therapeutic Interventions: S: Pt seen this date for skilled ST intervention targeting cognitive goals outlined above. Pt received awake, though lethargic and lying semi-reclined in bed. Upon inquiry, pt reports nausea and severe headache; RN notified and provided medications half way into today's session. Agreeable to ST intervention at bedside. Participatory with cues and rest breaks.  O: Please see above for objective data re: pt's performance on targeted goals. SLP provided skilled education re: ST POC, current deficits, and use of call bell for immediate needs and wants. Pt verbalized and demonstrated  understanding given MIN A from SLP. Pt benefited greatly from donning P[employer]. Pt with intermittent moments of closing eyes and did appear limited by headache and nausea. Of note, pt with one instance of closing eyes, and then coming to ~20 seconds later, appearing disoriented and less articulate; RN notified of event. Pt was oriented x 3 shortly after episode, prior to SLP leaving.  A: Pt participatory throughout today's session and benefited from overall supervision level assistance for accuracy and thoroughness when completing basic cognitive tasks addressing attention, problem-solving/executive functioning, recall, and processing speed.   P: Pt left in bed with all safety measures activated. Call bell reviewed and within reach and all immediate needs met. Continue per current ST POC next session.  Pain Reporting headache and nausea; RN aware and provided medication. SLP offered repositioning, low stim environment, rest breaks, and distraction  Therapy/Group: Individual Therapy   A  11/27/2021, 7:50 PM 

## 2021-11-27 NOTE — Progress Notes (Signed)
Physical Therapy Session Note  Patient Details  Name: Kirk [name] MRN: [id number] Date of Birth: 02/10/1960  Today's Date: 11/27/2021 PT Individual Time: 0804-0900 PT Individual Time Calculation (min): 56 min  and Today's Date: 11/27/2021 PT Missed Time: 19 Minutes Missed Time Reason: Patient fatigue;Other (Comment) (increased BP with increased HA and fatigue)  Short Term Goals: Week 1:  PT Short Term Goal 1 (Week 1): Pt will transfer to [facility] with CGA PT Short Term Goal 2 (Week 1): Pt will ambulate 164f with CGA PT Short Term Goal 3 (Week 1): Pt will propell WC >100 with supervision assist PT Short Term Goal 4 (Week 1): Pt will tolerate >2 hours out of bed between therapies  Skilled Therapeutic Interventions/Progress Updates:  Patient supine in bed on entrance to room. Patient alert and agreeable to PT session.  Patient with no pain complaint at start of session.  Therapeutic Activity: Bed Mobility: Pt performed supine --> sit quickly with supervision for potential dizziness. Mild dizziness that dissipates quickly while seated. Education provided for taking time to transition in positioning to give brain/ body time to adjust BP.  No cues for technique.   At end of session, pt returns to supine with supervision and is able to adjust positioning in bed without need for physical assist.  Transfers: Pt performed sit<>stand and ambulatory stand pivot transfer from bed --> w/c with CGA/ MinA for balance. Throughout session pt demonstrates increased need for LOA up to MinA d/t fatigue and decreased balance using RW. Provided vc/ tc throughout for maintaining focus on fatigue level and need for rest for energy conservation. Pt also requires vc/ tc for safe technique including locking brakes on w/c prior to transfer, controlling descent into seat, keeping eyes open throughout.  Pt guided in continuous reciprocation of BUE and BLE using NuStep L3 x 152m 41 sec/ 15m29mrest break/ L3 x 1mi52m0 sec  with focus on maintaining continuous effort and full extension with BLE, as well as maintaining grasp with BUE with focus. On completing second bout, pt quickly puts Bil feet on floor d/t c/o dizziness. Requires rest break and not able to complete more time d/t decreased alertness with eyes closed seemingly falling asleep several times with initial LOB to L then R sides and waking up to catch self.   Gait Training:  Pt ambulated 125' x1 demonstrating brief bout with ataxia/ uncoordinated movement in BLE ~90 ft using RW with up to [place] safety in balance. Demonstrated good quality of gait during first half of distance with some breakdown in step height/ length, upright posture, level gaze, closing of eyes. Provided vc/ tc for maintaining safe technique and monitoring fatigue and need to sit/ rest.  While seated EOB, BP reading obtained with vs readings listed below. Informed RN.   Patient supine at end of session with brakes locked, bed alarm set, and all needs within reach.   Therapy Documentation Precautions:  Precautions Precautions: Fall Precaution Comments: watch vitals, B knee buckling Restrictions Weight Bearing Restrictions: No General: PT Amount of Missed Time (min): 19 Minutes PT Missed Treatment Reason: Patient fatigue;Other (Comment) (increased BP with increased HA and fatigue) Vital Signs: Therapy Vitals Pulse Rate: 65 BP: (!) 169/63 Patient Position (if appropriate): Sitting in R arm with regular adult sized cuff Pain: Moderate HA pain related during session. C/o dizziness, fatigue, ataxia/ coordination difficulties with fatigue.   Therapy/Group: Individual Therapy  JuliAlger Simons DPT, CSRS  11/27/2021, 9:04 AM

## 2021-11-27 NOTE — Progress Notes (Signed)
Occupational Therapy Session Note  Patient Details  Name: Kirk [name] MRN: [id number] Date of Birth: September 15, 1960  Today's Date: 11/27/2021 OT Individual Time: 9038-3338 OT Individual Time Calculation (min): 26 min  49 mins missed per PA request   Short Term Goals: Week 1:  OT Short Term Goal 1 (Week 1): Pt will be able to ambulate to toilet with LRAD with light CGA. OT Short Term Goal 2 (Week 1): Pt will demonstrate improved standing balance with donning pants with close S with no posterior lean. OT Short Term Goal 3 (Week 1): Pt will learn gentle self ROM exercises he can do for his B shoulders.  Skilled Therapeutic Interventions/Progress Updates:  Pt greeted supine in bed, pt reports HA, nausea and fatigue, alerted RN who already gave meds however pt did not remember. Pt also did not recall the results of his CT on Friday.    Pt completed supine>sit with supervision, dizziness reported once EOB, BP 173/82( 109) HR 65                  Pt completed sit>stand from EOB with bilateral knees noted to bend spontaneously however not true buckling, pt completed stand pivot to w/c with MIN HHA for + safety. Transported pt to gym with total A, however PA entered session and preferred for pt to return back to room.  Pt transported back to room and completed stand pivot back to bed with MIN HHA again bilateral knee bending noted but no LOB. Pt returned self to supine with supervision.  Bed alarm activated and all needs within reach.  Therapy Documentation Precautions:  Precautions Precautions: Fall Precaution Comments: watch vitals, B knee buckling Restrictions Weight Bearing Restrictions: No   Pain: 15/10 pain in head, pain meds provided as well as rest breaks and distraction.    Therapy/Group: Individual Therapy  Precious Haws 11/27/2021, 12:12 PM

## 2021-11-27 NOTE — Progress Notes (Incomplete)
Received patient in bed ,alert and oriented to person and place.Denies pain,no complaint.  Access used:Left upper arm fistula works well during treatment.  Hemodialysis treatment issue:None  Medicine given:None  Fluid removed: 4 liters  Duration of treatment: 4 hours  Pre -HD wt:97.4 kg  Post HD wt: 92.6 kg  Hands off to the floor nurse.

## 2021-11-27 NOTE — Progress Notes (Addendum)
Patient ID: Kirk [name], male   DOB: July 15, 1960, 61 y.o.   MRN: [id number] [facility] Progress Note   Assessment/ Plan:   1.  Subdural hematoma (Atraumatic): Conservative management recommended by neurosurgery and he is currently on intravenous levetiracetam for new onset seizures.  Ongoing physiatry support in CIR. 2. ESRD: Continue hemodialysis today on a Monday/Wednesday/Friday schedule-heparin free in the setting of recent subdural hematoma. 3. Anemia: Low hemoglobin and hematocrit, resume high-dose ESA. 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 5. Nutrition: Continue renal diet with ongoing supplementation as indicated. 6. Hypertension: Blood pressure marginally elevated, monitor with ongoing antihypertensive therapy and ultrafiltration with hemodialysis.  Subjective:   Reports to be feeling better and states that he is more stable on his feet.   Objective:   BP (!) 169/63 (BP Location: Right Arm)   Pulse 65   Temp 98.6 F (37 C) (Oral)   Resp 18   Ht 5\' 7"  (1.702 m)   Wt 92.6 kg   SpO2 100%   BMI 31.97 kg/m   Physical Exam: Gen: Appears comfortable resting in bed CVS: Pulse regular rhythm, normal rate, S1 and S2 normal Resp: Clear to auscultation bilaterally, no distinct rales or rhonchi Abd: Soft, flat, nontender, bowel sounds normal Ext: Left upper extremity AV fistula with palpable thrill and bruit  Labs: BMET Recent Labs  Lab 11/24/21 1610  NA 130*  K 5.0  CL 93*  CO2 26  GLUCOSE 103*  BUN 39*  CREATININE 11.57*  CALCIUM 8.9  PHOS 4.3   CBC Recent Labs  Lab 11/21/21 0321 11/24/21 1610  WBC 4.7 3.5*  HGB 8.3* 7.5*  HCT 26.0* 22.3*  MCV 94.2 93.7  PLT 101* 68*      Medications:     amLODipine  10 mg Oral QPM   calcitRIOL  1 mcg Oral Q M,W,F-HD   cholecalciferol  4,000 Units Oral Daily   cinacalcet  60 mg Oral Once per day  on Mon Wed Fri   darbepoetin (ARANESP) injection - DIALYSIS  60 mcg Subcutaneous Q Sat-1800   doxazosin  4 mg Oral Daily   guaiFENesin  1,200 mg Oral BID   hydrALAZINE  100 mg Oral Q8H   isosorbide mononitrate  60 mg Oral Daily   losartan  100 mg Oral QPM   methocarbamol  750 mg Oral QID   metoprolol tartrate  100 mg Oral BID   multivitamin  1 tablet Oral Daily   pantoprazole  40 mg Oral BID AC   pravastatin  40 mg Oral Daily   pregabalin  75 mg Oral Daily   senna-docusate  1 tablet Oral BID   sevelamer carbonate  3,200 mg Oral TID WC   sucralfate  1 g Oral TID WC & HS   traZODone  50 mg Oral QHS     Zetta Bills, MD 11/27/2021, 10:04 AM

## 2021-11-27 NOTE — Progress Notes (Signed)
Placed patient on CPAP, via FFM, 9.0 cm H20.previous settings.
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
## 2021-11-28 DIAGNOSIS — I5032 Chronic diastolic (congestive) heart failure: Secondary | ICD-10-CM | POA: Diagnosis not present

## 2021-11-28 DIAGNOSIS — N186 End stage renal disease: Secondary | ICD-10-CM | POA: Diagnosis not present

## 2021-11-28 DIAGNOSIS — S065X1D Traumatic subdural hemorrhage with loss of consciousness of 30 minutes or less, subsequent encounter: Secondary | ICD-10-CM | POA: Diagnosis not present

## 2021-11-28 DIAGNOSIS — I132 Hypertensive heart and chronic kidney disease with heart failure and with stage 5 chronic kidney disease, or end stage renal disease: Secondary | ICD-10-CM | POA: Diagnosis not present

## 2021-11-28 DIAGNOSIS — R569 Unspecified convulsions: Secondary | ICD-10-CM | POA: Diagnosis not present

## 2021-11-28 DIAGNOSIS — E876 Hypokalemia: Secondary | ICD-10-CM | POA: Diagnosis not present

## 2021-11-28 DIAGNOSIS — Z992 Dependence on renal dialysis: Secondary | ICD-10-CM | POA: Diagnosis not present

## 2021-11-28 DIAGNOSIS — N2581 Secondary hyperparathyroidism of renal origin: Secondary | ICD-10-CM | POA: Diagnosis not present

## 2021-11-28 DIAGNOSIS — D631 Anemia in chronic kidney disease: Secondary | ICD-10-CM | POA: Diagnosis not present

## 2021-11-28 LAB — GLUCOSE, CAPILLARY
Glucose-Capillary: 113 mg/dL — ABNORMAL HIGH (ref 70–99)
Glucose-Capillary: 101 mg/dL — ABNORMAL HIGH (ref 70–99)
Glucose-Capillary: 95 mg/dL (ref 70–99)
Glucose-Capillary: 101 mg/dL — ABNORMAL HIGH (ref 70–99)

## 2021-11-28 MED ORDER — LEVETIRACETAM 250 MG PO TABS
250.0000 mg | ORAL_TABLET | ORAL | Status: DC
  Administered 2021-11-29: 250 mg via ORAL
  Filled 2021-11-28: qty 1

## 2021-11-28 MED ORDER — LEVETIRACETAM 250 MG PO TABS
750.0000 mg | ORAL_TABLET | Freq: Every day | ORAL | Status: DC
  Administered 2021-11-28 – 2021-11-29 (×2): 750 mg via ORAL
  Filled 2021-11-28 (×3): qty 3

## 2021-11-28 NOTE — Progress Notes (Signed)
Occupational Therapy Session Note  Patient Details  Name: Kirk [name] MRN: [id number] Date of Birth: 09-09-1960  Today's Date: 11/28/2021 OT Individual Time: 7078-6754 OT Individual Time Calculation (min): 45 min    Short Term Goals: Week 1:  OT Short Term Goal 1 (Week 1): Pt will be able to ambulate to toilet with LRAD with light CGA. OT Short Term Goal 2 (Week 1): Pt will demonstrate improved standing balance with donning pants with close S with no posterior lean. OT Short Term Goal 3 (Week 1): Pt will learn gentle self ROM exercises he can do for his B shoulders.  Skilled Therapeutic Interventions/Progress Updates:    Pt received in bed ready for therapy.  Offered pt a bath and to change clothes.  Pt stated he wants to wait for his wife to come this evening to help him.  Pt informed me that his neurologist plans to do another procedure on him soon.   Pt agreeable to participating in therapy.  Pt sat to EOB with S. Today he had full AROM of BUE and able to lift arms overhead.  On eval he had very limited AROM of shoulders (L arm to 30, R arm to 85). Pt stated his arm ROM changes from day to day depending on how he feels.   Pt worked on UE exercises with resisted rows using 2# dowel bar and pulling on red band for sh retraction.  Pt worked on pulling apart band for upper back strength.   Sit to stand exercises 15x with S and pt worked on reaching arms overhead.  Discussed with pt his fluctuating physical status with therapies.  Pt stated "much of my problem is mental.  I get nervous and then my body shuts down".  Discussed how to deal with this.  Standing with red band under feet and holding ends of band in each hand.  Resisted squats 15x.  Pt then stood a second time to work on resisted hip abduction with side steps.   Upon standing, pt began shaking and his eyes fluttered back for a few seconds.   Had pt sit immediately.  Asked pt what he feels happened, pt stated he had another seizure.   Pt coherent and alert but stated his head hurt more now and was very tired.  Pt stated he needed to lay down.  Pt resting in bed with alarm set. Informed RN of seizure incident during therapy.    Therapy Documentation Precautions:  Precautions Precautions: Fall Precaution Comments: watch vitals, B knee buckling Restrictions Weight Bearing Restrictions: No    Vital Signs: Therapy Vitals Temp: 99.1 F (37.3 C) Temp Source: Oral Pulse Rate: 65 Resp: 18 BP: (Abnormal) 164/67 Patient Position (if appropriate): Lying Oxygen Therapy SpO2: 99 % O2 Device: Room Air Pain: Pain Assessment Pain Scale: 0-10 Pain Score: 10-Worst pain ever Pain Intervention(s): Medication (See eMAR)  Therapy/Group: Individual Therapy  Kirk [name] 11/28/2021, 8:27 AM

## 2021-11-28 NOTE — Progress Notes (Signed)
Patient ID: Kirk [name], male   DOB: [birth year], 61 y.o.   MRN: [id number] [facility] Progress Note   Assessment/ Plan:   1.  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  Neurosurgery notes from earlier this morning suggest that he may be a candidate for right middle meningeal artery embolization to attempt to avoid operative subdural hematoma evacuation-additional details to follow conversation this afternoon. 2. ESRD: He had hemodialysis yesterday without problems on his usual MWF schedule and I will order for his next heparin-free hemodialysis again tomorrow. 3. Anemia: Low hemoglobin and hematocrit, continue to monitor on darbepoetin for need to titrate this further. 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 5. Nutrition: Continue renal diet with ongoing supplementation as indicated. 6. Hypertension: Blood pressure marginally elevated, monitor with ongoing antihypertensive therapy and ultrafiltration with hemodialysis.  Subjective:   Reports that he continues to feel better and admits that he has poor memory/recollection of earlier conversations.   Objective:   BP (!) 164/67   Pulse 65   Temp 99.1 F (37.3 C) (Oral)   Resp 18   Ht '5\' 7"'$  (1.702 m)   Wt 97.4 kg   SpO2 99%   BMI 33.63 kg/m   Physical Exam: Gen: Appears comfortable resting in bed CVS: Pulse regular rhythm, normal rate, S1 and S2 normal Resp: Clear to auscultation bilaterally, no distinct rales or rhonchi Abd: Soft, flat, nontender, bowel sounds normal Ext: Left upper extremity AV fistula with palpable thrill and bruit  Labs: BMET Recent Labs  Lab 11/24/21 1610  NA 130*  K 5.0  CL 93*  CO2 26  GLUCOSE 103*  BUN 39*  CREATININE 11.57*  CALCIUM 8.9  PHOS 4.3   CBC Recent Labs  Lab 11/24/21 1610  WBC 3.5*  HGB 7.5*  HCT 22.3*  MCV 93.7  PLT 68*       Medications:     amLODipine  10 mg Oral QPM   calcitRIOL  1 mcg Oral Q M,W,F-HD   cholecalciferol  4,000 Units Oral Daily   cinacalcet  60 mg Oral Once per day on Mon Wed Fri   darbepoetin (ARANESP) injection - DIALYSIS  60 mcg Subcutaneous Q Sat-1800   doxazosin  4 mg Oral Daily   guaiFENesin  1,200 mg Oral BID   hydrALAZINE  100 mg Oral Q8H   isosorbide mononitrate  60 mg Oral Daily   [START ON 11/29/2021] levETIRAcetam  250 mg Oral Q M,W,F-HD   levETIRAcetam  750 mg Oral Daily   losartan  100 mg Oral QPM   methocarbamol  750 mg Oral QID   metoprolol tartrate  100 mg Oral BID   multivitamin  1 tablet Oral Daily   pantoprazole  40 mg Oral BID AC   pravastatin  40 mg Oral Daily   pregabalin  75 mg Oral Daily   senna-docusate  1 tablet Oral BID   sevelamer carbonate  3,200 mg Oral TID WC   sucralfate  1 g Oral TID WC & HS   traZODone  50 mg Oral QHS     Elmarie Shiley, MD 11/28/2021, 9:15 AM

## 2021-11-28 NOTE — Progress Notes (Signed)
Orthopedic Tech Progress Note Patient Details:  Kirk [name] 01-09-1961 [id number]  Called in order to [facility] for [facility]   Patient ID: Kirk [name], male   DOB: 1961/01/16, 61 y.o.   MRN: [id number]  Kirk [name] 11/28/2021, 9:32 AM

## 2021-11-28 NOTE — Progress Notes (Signed)
Physical Therapy Session Note  Patient Details  Name: Kirk [name] MRN: [id number] Date of Birth: 09-05-1960  Today's Date: 11/28/2021 PT Individual Time: 0803-0906 PT Individual Time Calculation (min): 63 min   Short Term Goals: Week 1:  PT Short Term Goal 1 (Week 1): Pt will transfer to [facility] with CGA PT Short Term Goal 2 (Week 1): Pt will ambulate 130f with CGA PT Short Term Goal 3 (Week 1): Pt will propell WC >100 with supervision assist PT Short Term Goal 4 (Week 1): Pt will tolerate >2 hours out of bed between therapies  Skilled Therapeutic Interventions/Progress Updates:  Patient seated upright in w/c on entrance to room. Seat pad alarm on. Patient alert and agreeable to PT session.   Patient with no pain complaint at start of session. Slept well.   Therapeutic Activity: Bed Mobility: Pt performed supine <> sit with supervision. Extra time required for supine--> sit. No cueing required for technique. Transfers: Pt performed sit<>stand and stand pivot transfers throughout session with supervision. Provided verbal cues for controlling descent to seat.   Car transfer performed with [facility] at [employer] Pt is able to safely reach seat and bring LE into footwell. Then returns to RW with short ambulatory transfer back to w/c.  Gait Training:  Pt ambulated up/ down ramp with no AD and close supervision. Ascended/ descended four 6" steps using BHR and close supervision. Taken to stairwell steps as pt has 7+4 steps with one HR to enter home. Pt ascends 11 steps in stairwell with no UE support and reciprocal step pattern. Sits to rest at top of steps prior to descending. Descends using one handrail and slow, reciprocal stepping to bottom.   Pt provided with visual goal for ambulation distances and reaches 40' x1/ 881 x2 with good gait pattern throughout and no instance of HA or other s/s of high BP or impending seizure. Ambulates 100' x1 back to room. All with no AD. No LOB. Fatigue  demo'd with decreased step height/ length during final bout and pt returns to bed to rest.   Patient supine in bed at end of session with brakes locked, bed alarm set, and all needs within reach.  Pt missed 12 min of skilled therapy due to fatigue. Will re-attempt as schedule and pt availability permits.    Therapy Documentation Precautions:  Precautions Precautions: Fall Precaution Comments: watch vitals, B knee buckling Restrictions Weight Bearing Restrictions: No General: PT Amount of Missed Time (min): 12 Minutes PT Missed Treatment Reason: Patient fatigue Vital Signs: Therapy Vitals Temp: 99.1 F (37.3 C) Temp Source: Oral Pulse Rate: 65 Resp: 18 BP: (!) 164/67 Patient Position (if appropriate): Lying Oxygen Therapy SpO2: 99 % O2 Device: Room Air Pain: Pain Assessment Pain Scale: 0-10 Pain Score: 10-Worst pain ever Pain Intervention(s): Medication (See eMAR)   Therapy/Group: Individual Therapy  JAlger SimonsPT, DPT, CSRS 11/28/2021, 7:39 AM

## 2021-11-28 NOTE — Progress Notes (Signed)
Physical Therapy Session Note  Patient Details  Name: Kirk [name] MRN: [id number] Date of Birth: [birth year]  Today's Date: 11/28/2021 PT Individual Time: 1302-1400 PT Individual Time Calculation (min): 58 min   Short Term Goals: Week 1:  PT Short Term Goal 1 (Week 1): Pt will transfer to [facility] with CGA PT Short Term Goal 2 (Week 1): Pt will ambulate 127f with CGA PT Short Term Goal 3 (Week 1): Pt will propell WC >100 with supervision assist PT Short Term Goal 4 (Week 1): Pt will tolerate >2 hours out of bed between therapies  Skilled Therapeutic Interventions/Progress Updates:  Patient sidelying in bed on entrance to room. Patient alert and agreeable to PT session.   Patient with no pain complaint at start of session.  Pt taken outside to improve mood and build therapeutic alliance.  Therapeutic Activity: Bed Mobility: Pt performed supine --> sit with supervision/ ModI. No vc for technique.  Transfers: Pt performed sit<>stand and stand pivot transfers throughout session with supervision. Provided verbal cues for controlling descent with BLE.  Gait Training:  Pt ambulated outdoors over uneven pavement for 100' x2 as well as ambulating over sliding door thresholds [facility] Pt wanting to continue to walk onto the elevator but explained that pt has already walked >200 ft with no ataxic activity, LOB or indication of adverse neural activity, it would be wise not to push self to the point of seizure. Pt in agreement and willing to ride to therapy gym.  As pt walks with cane at home, provided with hurrycane to ambulate about gym. Pt prefers hurrycane to old SPC at home. Slowly ascends 3" steps and descends 6" steps using cane.   Patient seated EOB at end of session with brakes locked, bed alarm set, and all needs within reach. Wife and dtr present.   Therapy Documentation Precautions:  Precautions Precautions: Fall Precaution Comments: watch vitals, B  knee buckling Restrictions Weight Bearing Restrictions: No General: PT Amount of Missed Time (min): 12 Minutes PT Missed Treatment Reason: Patient fatigue Vital Signs:  Pain:  No pain related this session.  Therapy/Group: Individual Therapy  JAlger SimonsPT, DPT, CSRS 11/28/2021, 9:12 AM
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
## 2021-11-28 NOTE — Consult Note (Signed)
Chief Complaint   Subdural hematoma  History of Present Illness  Kirk [name] is a 61 y.o. male whose history initially begins approximately 1 month ago when he presented to the emergency department with seizure.  His imaging revealed a small acute right convexity subdural hematoma.  At the time he did not require operative intervention.  Patient has subsequently presented with headache and further seizures which have been managed medically.  Repeat CT scan has demonstrated enlargement of the now subacute/chronic right frontal convexity subdural hematoma, with slightly worsened local mass effect and midline shift.  While the patient does not require emergent operative decompression, I was asked to see the patient for possibility of middle meningeal artery embolization.  Past Medical History   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)    Anxiety    Asthma    Asthma    moderate persistent   Carpal tunnel syndrome    Cellulitis and abscess of trunk    Cholelithiasis 07/13/2014   Chronic headaches    Debility, unspecified    Dermatophytosis of the body    Dysrhythmia    history of   Edema    End stage renal disease on dialysis [facility])    "MWF; [facility]" (10/21/2014)   Essential hypertension, benign    GERD (gastroesophageal reflux disease)    Gout, unspecified    HTN (hypertension)    Hypertrophy of prostate without urinary obstruction and other lower urinary tract symptoms (LUTS)    Hypotension, unspecified    Impotence of organic origin    Insomnia, unspecified    Kidney replaced by transplant    Localization-related (focal) (partial) epilepsy and epileptic syndromes with complex partial seizures, without mention of intractable epilepsy    12-15-19- Wife states he has NEVER had a seizure    Lumbago    Memory  loss    OSA on CPAP    Other and unspecified hyperlipidemia    controlled /managed per wife    Other chronic nonalcoholic liver disease    Other malaise and fatigue    Other nonspecific abnormal serum enzyme levels    Pain in joint, lower leg    Pain in joint, upper arm    Pneumonia "several times"   Renal dialysis status(V45.11) 02/05/2010   restarted 01/02/13 ofter renal trransplant failure   Secondary hyperparathyroidism (of renal origin)    Shortness of breath    Sleep apnea    wears cpap    Tension headache    Unspecified constipation    Unspecified essential hypertension    Unspecified hereditary and idiopathic peripheral neuropathy    Unspecified vitamin D deficiency     Past Surgical History   Past Surgical History:  Procedure Laterality Date   AV FISTULA PLACEMENT Left ?2010   "forearm; at [place]"   [place]  03/21/2011   CHOLECYSTECTOMY N/A 10/21/2014   Procedure: LAPAROSCOPIC CHOLECYSTECTOMY WITH INTRAOPERATIVE CHOLANGIOGRAM;  Surgeon: Autumn Messing III, MD;  Location: [place];  Service: General;  Laterality: N/A;   COLONOSCOPY     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   KIDNEY TRANSPLANT  08/17/2011   [facility]    LAPAROSCOPIC CHOLECYSTECTOMY  10/21/2014   w/IOC   LEFT HEART CATHETERIZATION WITH CORONARY ANGIOGRAM  N/A 03/21/2011   Procedure: LEFT HEART CATHETERIZATION WITH CORONARY ANGIOGRAM;  Surgeon: [name], MD;  Location: [facility] CATH LAB;  Service: Cardiovascular;  Laterality: N/A;   NEPHRECTOMY  08/2013   removed transplaned kidney   POLYPECTOMY     POSTERIOR FUSION CERVICAL SPINE  06/25/2012   for spinal stenosis   VASECTOMY  2010    Social History   Social History   Tobacco Use   Smoking status: Former    Packs/day: 0.50    Years: 32.00    Total pack years: 16.00    Types: Cigarettes    Start date: 40    Quit date: 02/24/2020    Years since quitting: 1.7   Smokeless tobacco: Never   Vaping Use   Vaping Use: Never used  Substance Use Topics   Alcohol use: No    Alcohol/week: 0.0 standard drinks of alcohol   Drug use: No    Medications   Prior to Admission medications   Medication Sig Start Date End Date Taking? Authorizing Provider  acetaminophen (TYLENOL) 500 MG tablet Take 1 tablet (500 mg total) by mouth every 6 (six) hours as needed for moderate pain. 09/12/14   Samuella Cota, MD  albuterol (PROVENTIL HFA;VENTOLIN HFA) 108 (90 Base) MCG/ACT inhaler Inhale 2 puffs into the lungs every 6 (six) hours as needed for wheezing or shortness of breath. Patient not taking: Reported on 11/07/2021 06/05/17   Patrecia Pour, MD  amLODipine (NORVASC) 10 MG tablet Take 1 tablet (10 mg total) by mouth daily. 10/19/21   Ngetich, Dinah C, NP  b complex-vitamin c-folic acid (NEPHRO-VITE) 0.8 MG TABS tablet TAKE 1 TABLET BY MOUTH EVERY DAY Patient taking differently: Take 1 tablet by mouth daily. 02/08/20   Reed, Tiffany L, DO  calcitRIOL (ROCALTROL) 0.5 MCG capsule Take 2 capsules (1 mcg total) by mouth every Monday, Wednesday, and Friday with hemodialysis. 06/04/18   Black, Lezlie Octave, NP  Cholecalciferol (VITAMIN D3) 25 MCG (1000 UT) CAPS Take 4,000 Units by mouth daily.    [provider]  cinacalcet (SENSIPAR) 30 MG tablet Take 30 mg by mouth daily. Patient not taking: Reported on 11/07/2021    [provider]  diphenhydrAMINE (BENADRYL) 25 MG tablet Take 50 mg by mouth at bedtime.    [provider]  doxazosin (CARDURA) 4 MG tablet Take 1 tablet (4 mg total) by mouth daily. 07/04/21   Ngetich, Dinah C, NP  folic acid in sodium chloride 0.9 % 50 mL Inject into the vein. And B-Complex.    [provider]  guaiFENesin (MUCINEX) 600 MG 12 hr tablet Take 1,200 mg by mouth 2 (two) times daily.    [provider]  hydrALAZINE (APRESOLINE) 100 MG tablet TAKE 1 TABLET EVERY MORNINGAND AT BEDTIME. Patient taking differently: Take 100 mg by mouth 2  (two) times daily as needed (for systolic blood pressure greater than 140). 08/22/21   Hilty, Nadean Corwin, MD  ipratropium-albuterol (DUONEB) 0.5-2.5 (3) MG/3ML SOLN Take 3 mLs by nebulization every 4 (four) hours as needed (Shortness of breath). Patient not taking: Reported on 11/07/2021 12/05/18   Mariel Aloe, MD  isosorbide mononitrate (IMDUR) 30 MG 24 hr tablet TAKE 1 TABLET DAILY Patient taking differently: Take 30 mg by mouth daily. 11/10/19   Hilty, Nadean Corwin, MD  lidocaine-prilocaine (EMLA) cream Apply 1 application topically See admin instructions. Apply 1 to 2 hours prior to dialysis on Mondays, Wednesdays, and Fridays. Cover with saran wrap. 11/18/18  [provider]  methocarbamol (ROBAXIN) 750 MG tablet Take 1 tablet (750 mg total) by mouth in the morning, at noon, in the evening, and at bedtime. 09/12/21   Ngetich, Dinah C, NP  metoprolol tartrate (LOPRESSOR) 100 MG tablet TAKE 1 TABLET BY MOUTH TWICE A DAY Patient taking differently: Take 100 mg by mouth 2 (two) times daily. 09/21/20   Hilty, Nadean Corwin, MD  omeprazole (PRILOSEC) 20 MG capsule TAKE 1 CAPSULE TWICE DAILY BEFORE A MEAL Patient taking differently: Take 20 mg by mouth 2 (two) times daily before a meal. 02/09/21   Ngetich, Dinah C, NP  ondansetron (ZOFRAN ODT) 4 MG disintegrating tablet Take 1 tablet (4 mg total) by mouth every 8 (eight) hours as needed for nausea or vomiting. 01/11/21   Bonnielee Haff, MD  pravastatin (PRAVACHOL) 40 MG tablet TAKE 1 TABLET BY MOUTH EVERY DAY IN THE EVENING Patient taking differently: Take 40 mg by mouth daily. 10/19/21   Ngetich, Nelda Bucks, NP  PRESCRIPTION MEDICATION See admin instructions. CPAP- At bedtime and during any time of rest    [provider]  RENVELA 800 MG tablet Take 1,600-2,400 mg by mouth See admin instructions. Take 2,400 mg by mouth three times a day with meals and 1600 mg with each snack 07/06/19   [provider]  traMADol (ULTRAM) 50 MG tablet  Take 1 tablet (50 mg total) by mouth daily as needed. Patient taking differently: Take 50 mg by mouth daily as needed for moderate pain. 09/12/21   Ngetich, Dinah C, NP  traZODone (DESYREL) 50 MG tablet TAKE 1/2 TO 1 TABLET BY MOUTH AT BEDTIME AS NEEDED FOR SLEEP Patient taking differently: Take 50 mg by mouth at bedtime. 10/30/21   Ngetich, Dinah C, NP  vitamin C (ASCORBIC ACID) 250 MG tablet Take 1 tablet (250 mg total) by mouth daily. 07/04/21   Ngetich, Dinah C, NP    Allergies   Allergies  Allergen Reactions   Codeine Nausea And Vomiting   Hydrocodone-Acetaminophen Nausea And Vomiting    Review of Systems  ROS  Neurologic Exam  Awake, alert, oriented Memory and concentration grossly intact Speech fluent, appropriate CN grossly intact Motor exam: Upper Extremities Deltoid Bicep Tricep Grip  Right 5/5 5/5 5/5 5/5  Left 5/5 5/5 5/5 5/5   Lower Extremities IP Quad PF DF EHL  Right 5/5 5/5 5/5 5/5 5/5  Left 5/5 5/5 5/5 5/5 5/5   Sensation grossly intact to LT  Imaging  CT and MRI scans over the past 1 month were personally reviewed.  These demonstrate slight enlargement of the right frontal convexity subdural hematoma.  Most recent CT scan revealed subacute/chronic subdural with mild local mass effect and approximately 1 mm of right to left midline shift.  There is no hydrocephalus.  Impression  - 61 y.o. male with end-stage renal disease and subacute/chronic subdural hematoma which is enlarging over the last several weeks.  I therefore think he is a good candidate for right middle meningeal artery embolization in an attempt to avoid open surgical intervention.  Plan  -We will plan on right middle meningeal artery embolization tentatively scheduled this coming Thursday, 11/30/21. -Continue to hold blood thinners for now.  I have reviewed the imaging findings thus far with the patient and his family at bedside.  We have discussed treatment options for subdural hematoma  including continued expectant observation, surgical evacuation, and middle meningeal artery embolization.  Given the minimally symptomatic nature of the subdural with only minor mass  effect, I do think he is a candidate for MMA embolization.  I reviewed with them the details of the procedure and the expected postoperative course and recovery.  We discussed the risks of the procedure primarily being risk of stroke, arterial dissection, contrast nephropathy, and groin hematoma.  All their questions today were answered and the patient does wish to proceed with the embolization.   Consuella Lose, MD [facility]

## 2021-11-28 NOTE — Progress Notes (Signed)
Discussed case with Dr. Ronnald Ramp over weekend, reviewed EMR and recent head CTs. Pt appears to be a candidate for right MMA embolization in and attempt to avoid operative SDH evacuation. Pt currently in PT session now, will speak with patient later this pm.  Consuella Lose, MD [facility]

## 2021-11-29 ENCOUNTER — Inpatient Hospital Stay (HOSPITAL_COMMUNITY): Payer: Medicare Other

## 2021-11-29 ENCOUNTER — Encounter (HOSPITAL_COMMUNITY): Payer: Self-pay | Admitting: Anesthesiology

## 2021-11-29 DIAGNOSIS — I132 Hypertensive heart and chronic kidney disease with heart failure and with stage 5 chronic kidney disease, or end stage renal disease: Secondary | ICD-10-CM | POA: Diagnosis not present

## 2021-11-29 DIAGNOSIS — S065X1D Traumatic subdural hemorrhage with loss of consciousness of 30 minutes or less, subsequent encounter: Secondary | ICD-10-CM | POA: Diagnosis not present

## 2021-11-29 DIAGNOSIS — N2581 Secondary hyperparathyroidism of renal origin: Secondary | ICD-10-CM | POA: Diagnosis not present

## 2021-11-29 DIAGNOSIS — I5032 Chronic diastolic (congestive) heart failure: Secondary | ICD-10-CM | POA: Diagnosis not present

## 2021-11-29 DIAGNOSIS — D631 Anemia in chronic kidney disease: Secondary | ICD-10-CM | POA: Diagnosis not present

## 2021-11-29 DIAGNOSIS — R519 Headache, unspecified: Secondary | ICD-10-CM | POA: Diagnosis not present

## 2021-11-29 DIAGNOSIS — Z992 Dependence on renal dialysis: Secondary | ICD-10-CM | POA: Diagnosis not present

## 2021-11-29 DIAGNOSIS — N186 End stage renal disease: Secondary | ICD-10-CM | POA: Diagnosis not present

## 2021-11-29 DIAGNOSIS — R569 Unspecified convulsions: Secondary | ICD-10-CM | POA: Diagnosis not present

## 2021-11-29 DIAGNOSIS — E876 Hypokalemia: Secondary | ICD-10-CM | POA: Diagnosis not present

## 2021-11-29 LAB — CBC WITH DIFFERENTIAL/PLATELET
Abs Immature Granulocytes: 0.01 10*3/uL (ref 0.00–0.07)
Basophils Absolute: 0 10*3/uL (ref 0.0–0.1)
Basophils Relative: 1 %
Eosinophils Absolute: 0.1 10*3/uL (ref 0.0–0.5)
Eosinophils Relative: 3 %
HCT: 23.9 % — ABNORMAL LOW (ref 39.0–52.0)
Hemoglobin: 8 g/dL — ABNORMAL LOW (ref 13.0–17.0)
Immature Granulocytes: 0 %
Lymphocytes Relative: 23 %
Lymphs Abs: 1 10*3/uL (ref 0.7–4.0)
MCH: 31.1 pg (ref 26.0–34.0)
MCHC: 33.5 g/dL (ref 30.0–36.0)
MCV: 93 fL (ref 80.0–100.0)
Monocytes Absolute: 0.4 10*3/uL (ref 0.1–1.0)
Monocytes Relative: 10 %
Neutro Abs: 2.7 10*3/uL (ref 1.7–7.7)
Neutrophils Relative %: 63 %
Platelets: 81 10*3/uL — ABNORMAL LOW (ref 150–400)
RBC: 2.57 MIL/uL — ABNORMAL LOW (ref 4.22–5.81)
RDW: 14 % (ref 11.5–15.5)
WBC: 4.2 10*3/uL (ref 4.0–10.5)
nRBC: 0 % (ref 0.0–0.2)

## 2021-11-29 LAB — GLUCOSE, CAPILLARY
Glucose-Capillary: 105 mg/dL — ABNORMAL HIGH (ref 70–99)
Glucose-Capillary: 116 mg/dL — ABNORMAL HIGH (ref 70–99)
Glucose-Capillary: 118 mg/dL — ABNORMAL HIGH (ref 70–99)
Glucose-Capillary: 109 mg/dL — ABNORMAL HIGH (ref 70–99)

## 2021-11-29 LAB — LEVETIRACETAM LEVEL: Levetiracetam Lvl: 18.4 ug/mL (ref 10.0–40.0)

## 2021-11-29 MED ORDER — TRAZODONE HCL 50 MG PO TABS: 50.0000 mg | ORAL_TABLET | Freq: Every day | ORAL | Status: DC

## 2021-11-29 MED ORDER — NITROGLYCERIN 0.4 MG SL SUBL: 0.4000 mg | SUBLINGUAL_TABLET | SUBLINGUAL | 12 refills | Status: DC | PRN

## 2021-11-29 MED ORDER — RENA-VITE PO TABS: 1.0000 | ORAL_TABLET | Freq: Every day | ORAL | 0 refills | Status: DC

## 2021-11-29 MED ORDER — DARBEPOETIN ALFA 60 MCG/0.3ML IJ SOSY: 60.0000 ug | PREFILLED_SYRINGE | INTRAMUSCULAR | Status: DC

## 2021-11-29 MED ORDER — LEVETIRACETAM 250 MG PO TABS: 250.0000 mg | ORAL_TABLET | ORAL | Status: DC

## 2021-11-29 MED ORDER — LOSARTAN POTASSIUM 100 MG PO TABS: 100.0000 mg | ORAL_TABLET | Freq: Every evening | ORAL | Status: DC

## 2021-11-29 MED ORDER — SUCRALFATE 1 GM/10ML PO SUSP: 1.0000 g | Freq: Three times a day (TID) | ORAL | 0 refills | Status: DC

## 2021-11-29 MED ORDER — AMLODIPINE BESYLATE 10 MG PO TABS: 10.0000 mg | ORAL_TABLET | Freq: Every evening | ORAL | Status: DC

## 2021-11-29 MED ORDER — SEVELAMER CARBONATE 800 MG PO TABS: 3200.0000 mg | ORAL_TABLET | Freq: Three times a day (TID) | ORAL | Status: DC

## 2021-11-29 MED ORDER — OXYCODONE HCL 5 MG PO TABS: 5.0000 mg | ORAL_TABLET | ORAL | 0 refills | Status: DC | PRN

## 2021-11-29 MED ORDER — PANTOPRAZOLE SODIUM 40 MG PO TBEC: 40.0000 mg | DELAYED_RELEASE_TABLET | Freq: Two times a day (BID) | ORAL | Status: DC

## 2021-11-29 MED ORDER — ISOSORBIDE MONONITRATE ER 60 MG PO TB24: 60.0000 mg | ORAL_TABLET | Freq: Every day | ORAL | Status: DC

## 2021-11-29 MED ORDER — LEVETIRACETAM 750 MG PO TABS: 750.0000 mg | ORAL_TABLET | Freq: Every day | ORAL | Status: DC

## 2021-11-29 MED ORDER — METHOCARBAMOL 750 MG PO TABS: 750.0000 mg | ORAL_TABLET | Freq: Four times a day (QID) | ORAL | Status: DC

## 2021-11-29 MED ORDER — SENNOSIDES-DOCUSATE SODIUM 8.6-50 MG PO TABS: 1.0000 | ORAL_TABLET | Freq: Two times a day (BID) | ORAL | Status: DC

## 2021-11-29 MED ORDER — SEVELAMER CARBONATE 800 MG PO TABS: 1600.0000 mg | ORAL_TABLET | ORAL | Status: DC | PRN

## 2021-11-29 MED ORDER — PREGABALIN 75 MG PO CAPS: 75.0000 mg | ORAL_CAPSULE | Freq: Every day | ORAL | Status: DC

## 2021-11-29 MED ORDER — CLONIDINE HCL 0.2 MG PO TABS: 0.2000 mg | ORAL_TABLET | ORAL | 11 refills | Status: DC | PRN

## 2021-11-29 MED ORDER — CINACALCET HCL 30 MG PO TABS: 60.0000 mg | ORAL_TABLET | ORAL | Status: DC

## 2021-11-29 MED ORDER — HYDRALAZINE HCL 100 MG PO TABS: 100.0000 mg | ORAL_TABLET | Freq: Three times a day (TID) | ORAL | Status: DC

## 2021-11-29 NOTE — Progress Notes (Addendum)
Patient ID: Kirk [name], male   DOB: 02/22/1960, 61 y.o.   MRN: [id number]  Team Conference Report to Patient/Family  Team Conference discussion was reviewed with the patient and caregiver, including goals, any changes in plan of care and target discharge date.  Patient and caregiver express understanding and are in agreement.  The patient has a target discharge date of 11/30/21.  SW met with patient and called spouse, Kirk [name] via telephone and provided conference updates. Both are aware of plan for patient to discharge to surgery tomorrow and anticipated to return Friday pending medical stability. Patient spouse able to attend education Friday 9-12. No additional questions or concerns.  J  11/29/2021, 1:24 PM  

## 2021-11-29 NOTE — Discharge Summary (Addendum)
Physician Discharge Summary  Patient ID: Kirk [name] MRN: [id number] DOB/AGE: Jan 15, 1961 61 y.o.  Admit date: 11/21/2021 Discharge date: 11/30/2021  Discharge Diagnoses:  Principal Problem:   Traumatic subdural hematoma (SDH) (HCC) Active Problems:   Uncontrolled hypertension   ESRD on dialysis ([place])   Chronic systolic heart failure (HCC) Seizure prophylaxis Chronic headaches PAF with RVR Hypertension Hyperlipidemia Anemia of chronic disease GERD Obesity OSA Thrombocytopenia  Discharged Condition: Stable  Significant Diagnostic Studies: CT HEAD WO CONTRAST (5MM)  Result Date: 11/29/2021 CLINICAL DATA:  61 year old male with a right greater than left subdural hematoma last month. Headache, seizure like activity. EXAM: CT HEAD WITHOUT CONTRAST TECHNIQUE: Contiguous axial images were obtained from the base of the skull through the vertex without intravenous contrast. RADIATION DOSE REDUCTION: This exam was performed according to the departmental dose-optimization program which includes automated exposure control, adjustment of the mA and/or kV according to patient size and/or use of iterative reconstruction technique. COMPARISON:  Head CTs 11/24/2021 and earlier. FINDINGS: Brain: Mixed density right side subdural hematoma again appears slightly larger since 11/24/2021, now up to 14 mm thickness (series 4, image 35). Volume of hyperdense blood products not significantly changed. Still there is only trace leftward midline shift. Basilar cisterns remain patent. Trace left side subdural blood demonstrated on MRI last month remains occult by CT. No new areas of intracranial hemorrhage. Subtle mass effect on the right lateral ventricle. No ventriculomegaly. Stable gray-white matter differentiation throughout the brain. No cortically based acute infarct identified. Vascular: Extensive Calcified atherosclerosis at the skull base. No suspicious intracranial vascular hyperdensity. Skull: Stable.   No acute osseous abnormality identified. Sinuses/Orbits: Chronic right mastoid sclerosis. Visualized paranasal sinuses and mastoids are stable and well aerated. Other: No acute orbit or scalp soft tissue finding. Calcified scalp vessel atherosclerosis. IMPRESSION: 1. Slowly enlarging mixed density Right Subdural Hematoma, now slightly larger since 11/24/2021, up to 14 mm thickness. But still only trace leftward midline shift, and no other significant intracranial mass effect. 2. Trace left side subdural blood demonstrated on MRI last month remains occult by CT. No new intracranial abnormality. Electronically Signed   By: Genevie Ann M.D.   On: 11/29/2021 07:26   EEG adult  Result Date: 11/24/2021 [name], MD     11/24/2021 11:38 AM Patient Name: Kirk [name] MRN: [id number] Epilepsy Attending: [name] Referring Physician/Provider: Kerney Elbe, MD Date: 11/24/2021 Duration: 24.37 mins Patient history: 61 year old dialysis patient with acute right SDH with new onset seizure presenting on 10/17. Patient with new onset left-sided neglect and right forced gaze on 10/24.  EEG to evaluate for seizure Level of alertness: Awake, asleep AEDs during EEG study: LEV, PGB Technical aspects: This EEG study was done with scalp electrodes positioned according to the 10-20 International system of electrode placement. Electrical activity was reviewed with band pass filter of 1-'70Hz'$ , sensitivity of 7 uV/mm, display speed of 35m/sec with a '60Hz'$  notched filter applied as appropriate. EEG data were recorded continuously and digitally stored.  Video monitoring was available and reviewed as appropriate. Description: The posterior dominant rhythm consists of 8 Hz activity of moderate voltage (25-35 uV) seen predominantly in posterior head regions, symmetric and reactive to eye opening and eye closing. Sleep was characterized by vertex waves, sleep spindles (12 to 14 Hz), maximal frontocentral region. EEG showed near  continuous 3 to 6 Hz theta-delta slowing in right parieto-occipital region. Hyperventilation and photic stimulation were not performed.   ABNORMALITY - Continuous slow, right parieto-occipital  region IMPRESSION: This study is suggestive of cortical dysfunction arising from right parieto-occipital region likely secondary to underlying SDH. No seizures or definite epileptiform discharges were seen throughout the recording. Priyanka Barbra Sarks   CT HEAD WO CONTRAST (5MM)  Result Date: 11/24/2021 CLINICAL DATA:  Headache history of subdural hematoma. EXAM: CT HEAD WITHOUT CONTRAST TECHNIQUE: Contiguous axial images were obtained from the base of the skull through the vertex without intravenous contrast. RADIATION DOSE REDUCTION: This exam was performed according to the departmental dose-optimization program which includes automated exposure control, adjustment of the mA and/or kV according to patient size and/or use of iterative reconstruction technique. COMPARISON:  Brain MRI 11/16/2021, CT head 11/14/2021 FINDINGS: Brain: The mixed density but primarily hypodense right cerebral convexity subdural hematoma measures up to 1.3 cm in maximal thickness in the coronal plane (5-44), increased in size compared to the head CT from 11/14/2021 when it measured up to 1.0 cm. There is likely trace new/acute blood within the collection (5-34). There is mass effect on the underlying brain parenchyma with sulcal effacement, partial effacement of the right lateral ventricle, and trace leftward midline shift. The smaller left-sided subdural seen on the prior MRI is not seen on the current study. There is no other acute intracranial hemorrhage. There is no acute territorial infarct. Background parenchymal volume is normal. The ventricles are otherwise normal in size. Gray-white differentiation is preserved. There is no solid mass lesion. Vascular: No hyperdense vessel or unexpected calcification. Skull: Normal. Negative for fracture or  focal lesion. Sinuses/Orbits: The paranasal sinuses are clear. The globes and orbits are unremarkable. Other: None. IMPRESSION: Slight interval increase in size of the mixed density subacute right cerebral convexity subdural hematoma now measuring up to 1.3 cm (previously measured 1.0 cm), with trace new/acute blood and mild mass effect and trace leftward midline shift. Electronically Signed   By: Valetta Mole M.D.   On: 11/24/2021 09:00   ECHOCARDIOGRAM COMPLETE  Result Date: 11/16/2021    ECHOCARDIOGRAM REPORT   Patient Name:   DEROY NOAH Date of Exam: 11/16/2021 Medical Rec #:  [id number]       Height:       67.0 in Accession #:    [id number]      Weight:       200.0 lb Date of Birth:  01-Jul-1960        BSA:          2.022 m Patient Age:    35 years        BP:           175/70 mmHg Patient Gender: M               HR:           61 bpm. Exam Location:  Inpatient Procedure: 2D Echo, Color Doppler and Cardiac Doppler Indications:    I48.91* Unspecified atrial fibrillation  History:        Patient has prior history of Echocardiogram examinations, most                 recent 09/08/2020. Risk Factors:Hypertension, Diabetes,                 Dyslipidemia and Sleep Apnea.  Sonographer:    Raquel Sarna Senior RDCS Referring Phys: [place]  1. Left ventricular ejection fraction, by estimation, is 50 to 55%. The left ventricle has low normal function. The left ventricle has no regional wall motion abnormalities. Left ventricular diastolic parameters are  consistent with Grade I diastolic dysfunction (impaired relaxation).  2. Right ventricular systolic function is normal. The right ventricular size is normal. Tricuspid regurgitation signal is inadequate for assessing PA pressure.  3. Left atrial size was mildly dilated.  4. The mitral valve is normal in structure. Trivial mitral valve regurgitation. No evidence of mitral stenosis.  5. The aortic valve is normal in structure. Aortic valve regurgitation is  not visualized. Mild aortic valve stenosis. Aortic valve area, by VTI measures 1.69 cm. Aortic valve mean gradient measures 8.0 mmHg. Aortic valve Vmax measures 1.92 m/s.  6. The inferior vena cava is normal in size with greater than 50% respiratory variability, suggesting right atrial pressure of 3 mmHg. FINDINGS  Left Ventricle: Left ventricular ejection fraction, by estimation, is 50 to 55%. The left ventricle has low normal function. The left ventricle has no regional wall motion abnormalities. The left ventricular internal cavity size was normal in size. There is no left ventricular hypertrophy. Left ventricular diastolic parameters are consistent with Grade I diastolic dysfunction (impaired relaxation). Right Ventricle: The right ventricular size is normal. No increase in right ventricular wall thickness. Right ventricular systolic function is normal. Tricuspid regurgitation signal is inadequate for assessing PA pressure. Left Atrium: Left atrial size was mildly dilated. Right Atrium: Right atrial size was normal in size. Pericardium: There is no evidence of pericardial effusion. Presence of epicardial fat layer. Mitral Valve: The mitral valve is normal in structure. Trivial mitral valve regurgitation. No evidence of mitral valve stenosis. Tricuspid Valve: The tricuspid valve is normal in structure. Tricuspid valve regurgitation is not demonstrated. No evidence of tricuspid stenosis. Aortic Valve: The aortic valve is normal in structure. Aortic valve regurgitation is not visualized. Mild aortic stenosis is present. Aortic valve mean gradient measures 8.0 mmHg. Aortic valve peak gradient measures 14.7 mmHg. Aortic valve area, by VTI measures 1.69 cm. Pulmonic Valve: The pulmonic valve was normal in structure. Pulmonic valve regurgitation is not visualized. No evidence of pulmonic stenosis. Aorta: The aortic root is normal in size and structure. Venous: The inferior vena cava is normal in size with greater  than 50% respiratory variability, suggesting right atrial pressure of 3 mmHg. IAS/Shunts: No atrial level shunt detected by color flow Doppler.  LEFT VENTRICLE PLAX 2D LVIDd:         5.70 cm   Diastology LVIDs:         4.10 cm   LV e' medial:    6.42 cm/s LV PW:         1.20 cm   LV E/e' medial:  19.0 LV IVS:        0.90 cm   LV e' lateral:   7.07 cm/s LVOT diam:     2.20 cm   LV E/e' lateral: 17.3 LV SV:         76 LV SV Index:   38 LVOT Area:     3.80 cm  RIGHT VENTRICLE RV S prime:     13.40 cm/s TAPSE (M-mode): 2.4 cm LEFT ATRIUM             Index        RIGHT ATRIUM           Index LA diam:        4.40 cm 2.18 cm/m   RA Area:     18.30 cm LA Vol (A2C):   72.9 ml 36.03 ml/m  RA Volume:   50.20 ml  24.83 ml/m LA Vol (A4C):  63.6 ml 31.45 ml/m LA Biplane Vol: 69.9 ml 34.57 ml/m  AORTIC VALVE AV Area (Vmax):    1.69 cm AV Area (Vmean):   1.89 cm AV Area (VTI):     1.69 cm AV Vmax:           192.00 cm/s AV Vmean:          133.000 cm/s AV VTI:            0.452 m AV Peak Grad:      14.7 mmHg AV Mean Grad:      8.0 mmHg LVOT Vmax:         85.40 cm/s LVOT Vmean:        66.000 cm/s LVOT VTI:          0.201 m LVOT/AV VTI ratio: 0.44  AORTA Ao Root diam: 3.10 cm Ao Asc diam:  3.50 cm MITRAL VALVE MV Area (PHT): 3.61 cm     SHUNTS MV Decel Time: 210 msec     Systemic VTI:  0.20 m MV E velocity: 122.00 cm/s  Systemic Diam: 2.20 cm MV A velocity: 95.40 cm/s MV E/A ratio:  1.28 Kardie Tobb DO Electronically signed by Berniece Salines DO Signature Date/Time: 11/16/2021/4:05:41 PM    Final    MR BRAIN WO CONTRAST  Result Date: 11/16/2021 CLINICAL DATA:  Seizure EXAM: MRI HEAD WITHOUT CONTRAST TECHNIQUE: Multiplanar, multiecho pulse sequences of the brain and surrounding structures were obtained without intravenous contrast. COMPARISON:  No prior MRI, correlation is made with CT head 11/14/2021 FINDINGS: Brain: No restricted diffusion to suggest acute or subacute infarct. Redemonstrated subdural collection along the  right cerebral convexity, which measures up to 10 mm, similar to the prior CT. Fluid-fluid levels within the collection, with loculations, consistent with acute on chronic subdural hematoma. This causes some sulcal effacement along the right cerebral hemisphere, with 2 mm right-to-left midline shift. Mild mass effect on the right lateral ventricle. No hydrocephalus. Additional smaller collections along the left cerebral convexity, primarily about the left temporal and occipital lobe, measuring up to 2 mm (series 11, image 11), along the right tentorial leaflet, measuring 2 mm (series 25, image 7), along the right inferior temporal lobe (series 25, image 28), measuring approximately 1 mm, and along the medial aspect of the right temporal lobe, measuring 5 mm (series 25, image 33). No acute parenchymal hemorrhage or mass. Scattered T2 hyperintense signal in the periventricular white matter, likely the sequela of mild chronic small vessel ischemic disease. Evaluation is somewhat limited by motion artifact. Within this limitation, the hippocampi are symmetric in size and signal. No heterotopia or evidence of cortical dysgenesis. Vascular: Normal flow voids. Skull and upper cervical spine: Normal marrow signal. Sinuses/Orbits: No acute finding. Other: The mastoids are well aerated. IMPRESSION: 1. Redemonstrated acute on chronic subdural hematoma along the right cerebral convexity, measuring up to 10 mm, with some sulcal effacement and 2 mm right-to-left midline shift. 2. Additional smaller subdural collections along the left cerebral convexity, along the right tentorial leaflet, and along the right inferior temporal lobe, measuring up to 5 mm. 3. No additional acute intracranial process. No seizure etiology identified. Electronically Signed   By: Merilyn Baba M.D.   On: 11/16/2021 02:58   Overnight EEG with video  Result Date: 11/15/2021 [name], MD     11/15/2021  9:35 AM Patient Name: AMALIO LOE  MRN: [id number] Epilepsy Attending: [name] Referring Physician/Provider: Rikki Spearing, NP Duration: 11/14/2021 1034 to 11/15/2021  0758  Patient history: 61yo M with new onset seizures. EEG to evaluate for seizure.  Level of alertness: Awake, asleep  AEDs during EEG study: LEV  Technical aspects: This EEG study was done with scalp electrodes positioned according to the 10-20 International system of electrode placement. Electrical activity was reviewed with band pass filter of 1-'70Hz'$ , sensitivity of 7 uV/mm, display speed of 19m/sec with a '60Hz'$  notched filter applied as appropriate. EEG data were recorded continuously and digitally stored.  Video monitoring was available and reviewed as appropriate.  Description: At the beginning of the study, EEG showed continuous generalized and lateralized right hemisphere 3-6 theta-delta slowing.  Gradually, EEG improved and showed  posterior dominant rhythm of 8-'9Hz'$  activity of moderate voltage (25-35 uV) seen predominantly in posterior head regions, symmetric and reactive to eye opening and eye closing. Sleep was characterized by vertex waves, sleep spindles (12 to 14 Hz), maximal frontocentral region.  EEG also showed intermittent generalized and lateralized right hemisphere 3 to 6 Hz theta-delta slowing. Hyperventilation and photic stimulation were not performed.    ABNORMALITY - Continuous slow, generalized and lateralized right hemisphere  IMPRESSION: This study was initially suggestive of cortical dysfunction arising from right hemisphere likely secondary to postictal state as well as moderate to severe diffuse encephalopathy.  Gradually, EEG improved and was suggestive of mild to moderate diffuse encephalopathy, nonspecific etiology.  No seizures or epileptiform discharges were seen throughout the recording.  Please note lack of epileptiform activity does not exclude the diagnosis of epilepsy.   PLora Havens   DG CHEST PORT 1 VIEW  Result Date:  11/15/2021 CLINICAL DATA:  Sepsis. EXAM: PORTABLE CHEST 1 VIEW COMPARISON:  08/01/2021. FINDINGS: The heart is enlarged and the mediastinal contour is stable. The pulmonary vasculature is distended. Interstitial prominence is noted bilaterally. No consolidation, effusion, or pneumothorax. Cervical spinal fusion hardware is noted. No acute osseous abnormality. IMPRESSION: 1. Cardiomegaly with pulmonary vascular congestion. 2. Interstitial prominence bilaterally, possible edema or infiltrate. Electronically Signed   By: LBrett FairyM.D.   On: 11/15/2021 01:45   CT ANGIO HEAD NECK W WO CM W PERF (CODE STROKE)  Result Date: 11/14/2021 CLINICAL DATA:  61year old male code stroke presentation this morning. Recent right side subdural hematoma and seizure like activity. EXAM: CT ANGIOGRAPHY HEAD AND NECK CT PERFUSION BRAIN TECHNIQUE: Multidetector CT imaging of the head and neck was performed using the standard protocol during bolus administration of intravenous contrast. Multiplanar CT image reconstructions and MIPs were obtained to evaluate the vascular anatomy. Carotid stenosis measurements (when applicable) are obtained utilizing NASCET criteria, using the distal internal carotid diameter as the denominator. Multiphase CT imaging of the brain was performed following IV bolus contrast injection. Subsequent parametric perfusion maps were calculated using RAPID software. RADIATION DOSE REDUCTION: This exam was performed according to the departmental dose-optimization program which includes automated exposure control, adjustment of the mA and/or kV according to patient size and/or use of iterative reconstruction technique. CONTRAST:  1075mOMNIPAQUE IOHEXOL 350 MG/ML SOLN COMPARISON:  Head CT, 0851 hours today and earlier. Chest CT 08/01/2021 FINDINGS: CT Brain Perfusion Findings: CBF (<30%) Volume: 5 mL, but appears to be extra-axial artifact associated with the known SDH. Perfusion (Tmax>6.0s) volume: 4m64mno  T-max parameter abnormality Mismatch Volume: Not applicable Infarction Location: Not applicable. CTA NECK Skeleton: Absent dentition. Previous lower cervical spine fusion. Cervical spine degeneration elsewhere. No acute osseous abnormality identified. Upper chest: Respiratory motion. Scattered mostly sub solid bilateral peribronchial nodular opacities. These are  new from the July chest CT and appear inflammatory. No pleural effusion. No superior mediastinal lymphadenopathy. Other neck: Small volume retained secretions in the pharynx. Otherwise negative. Aortic arch: Calcified aortic atherosclerosis. 3 vessel arch configuration. Right carotid system: Negative brachiocephalic artery. Mild motion artifact and plaque at the right CCA origin without stenosis. Intermittent plaque before the bifurcation, including combined soft and calcified plaque series 5, image 119, but no stenosis. Mostly calcified plaque at the bifurcation, right ICA origin and bulb without stenosis. Moderate additional calcified plaque of the right ICA at the skull base with up to 50 % stenosis with respect to the distal vessel (series 5, image 72). Left carotid system: Mild left CCA plaque without stenosis. Calcified plaque at the left ICA origin and bulb with up to 50 % stenosis with respect to the distal vessel. Vertebral arteries: Right subclavian artery origin motion artifact and calcified plaque with estimated 70 % stenosis with respect to the distal vessel. See series 9, image 117. But the right vertebral artery origin is normal. Right vertebral artery is patent to the skull base with no significant plaque or stenosis. Contralateral moderate proximal left subclavian artery calcified plaque with no significant stenosis. Normal left vertebral artery origin. Codominant left vertebral artery is patent to the skull base with no significant plaque or stenosis. CTA HEAD Posterior circulation: Left vertebral V4 segment is normal to the vertebrobasilar  junction. Right vertebral V4 segment with tandem severe irregularity and tandem stenoses of 3-4 mm in length leading up to the vertebrobasilar junction. See series 12, images 24 and 23. Despite this the right V4 segment remains patent. Patent basilar artery with no significant stenosis. Normal SCA and PCA origins. Bilateral AICA origins are patent and may be dominant. Bilateral PCA branches are within normal limits. Anterior circulation: Both ICA siphons are patent with mild to moderate calcified plaque but only mild bilateral siphon stenosis. Patent carotid termini, MCA and ACA origins. Diminutive or absent anterior communicating artery. Bilateral ACA branches are within normal limits. Left MCA M1 segment and bifurcation are patent without stenosis. Left MCA branches are within normal limits. Right MCA M1 segment and bifurcation are patent without stenosis. Right MCA branches remain within normal limits, mild branch mass effect related to known broad-based right side subdural hematoma Venous sinuses: Patent. Right SDH displaces the cortical veins away from the inner table of the right skull as expected. No active extravasation into the right subdural hematoma is identified. Anatomic variants: None. Review of the MIP images confirms the above findings IMPRESSION: 1. Negative for large vessel occlusion, and negative CT Perfusion. 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 And other atherosclerosis in the neck with estimated 70% stenosis of the Right Subclavian Artery origin, up to 50% stenosis of the cervical ICAs. 2. Bilateral upper lobe multifocal peribronchial nodular opacities are new since a July chest CT, nonspecific but suspicious for Acute bronchopneumonia. 3. Known Right Side Subdural Hematoma with mild mass effect on the Right MCA branches. 4.  Aortic Atherosclerosis (ICD10-I70.0). Electronically Signed   By: Genevie Ann M.D.   On:  11/14/2021 09:34   CT HEAD CODE STROKE WO CONTRAST  Result Date: 11/14/2021 CLINICAL DATA:  Code stroke.  Right-sided gaze, slurred speech. EXAM: CT HEAD WITHOUT CONTRAST TECHNIQUE: Contiguous axial images were obtained from the base of the skull through the vertex without intravenous contrast. RADIATION DOSE REDUCTION: This exam was performed according to the departmental dose-optimization program  which includes automated exposure control, adjustment of the mA and/or kV according to patient size and/or use of iterative reconstruction technique. COMPARISON:  CT head 11/12/2019 FINDINGS: Brain: Again seen is the right cerebral convexity subdural hematoma measuring up to 1.0 cm in maximal thickness in the coronal plane (5-34), not significantly changed in maximal size but with new hyperdense blood layering dependently within the hematoma suggesting interval bleed. A small amount of subdural blood is also seen layering along the right tentorial leaflet, unchanged. The hematoma exerts mass effect on the underlying brain parenchyma with sulcal effacement and partial effacement of the right lateral ventricle but no midline shift. There is no other acute intracranial hemorrhage. There is no evidence of acute territorial infarct. Gray-white differentiation is preserved. Ventricles are stable in size.  There is no solid mass lesion. Vascular: No hyperdense vessel is seen. Skull: Normal. Negative for fracture or focal lesion. Sinuses/Orbits: The paranasal sinuses are clear. The globes and orbits are unremarkable. Other: None. IMPRESSION: 1. Similar size of the right cerebral convexity subdural hematoma measuring up to 1.0 cm in maximal thickness, but with new hyperdense blood layering dependently suggesting interval bleed. Mild mass effect on the underlying brain parenchyma with no midline shift. 2. No evidence of acute territorial infarct. Findings of the initial noncontrast head CT were discussed with Dr. Quinn Axe at 8:55  a.m. Electronically Signed   By: Valetta Mole M.D.   On: 11/14/2021 09:01   CT HEAD WO CONTRAST (5MM)  Result Date: 11/11/2021 CLINICAL DATA:  Follow-up subdural hematoma EXAM: CT HEAD WITHOUT CONTRAST TECHNIQUE: Contiguous axial images were obtained from the base of the skull through the vertex without intravenous contrast. RADIATION DOSE REDUCTION: This exam was performed according to the departmental dose-optimization program which includes automated exposure control, adjustment of the mA and/or kV according to patient size and/or use of iterative reconstruction technique. COMPARISON:  Head CT from 3 days ago FINDINGS: Brain: Mixed density subdural hematoma along the right cerebral convexity, maximal thickness along the anterior right frontal convexity measuring up to 8 mm. Local cortical mass effect is mild. High-density subdural clot along the right tentorial leaflet is new, possibly redistribution. No hydrocephalus, infarct, or masslike finding Vascular: Arterial calcifications correlating with end-stage renal disease Skull: Normal. Negative for fracture or focal lesion. Sinuses/Orbits: Negative IMPRESSION: Trace increase in subdural hemorrhage along the right tentorium, but unchanged maximal thickness along the right frontal convexity at 8 mm. Electronically Signed   By: Jorje Guild M.D.   On: 11/11/2021 11:44   EEG adult  Result Date: 11/09/2021 [name], MD     11/09/2021  3:18 PM Patient Name: JOSEH SJOGREN MRN: [id number] Epilepsy Attending: [name] Referring Physician/Provider: August Albino, NP Date:  11/09/2021 Duration: 23.38 mins Patient history: 61yo M with new onset seizures. EEG to evaluate for seizure. Level of alertness: Awake, asleep AEDs during EEG study: LEV Technical aspects: This EEG study was done with scalp electrodes positioned according to the 10-20 International system of electrode placement. Electrical activity was reviewed with band pass filter of  1-'70Hz'$ , sensitivity of 7 uV/mm, display speed of 55m/sec with a '60Hz'$  notched filter applied as appropriate. EEG data were recorded continuously and digitally stored.  Video monitoring was available and reviewed as appropriate. Description: The posterior dominant rhythm consists of 8-'9Hz'$  activity of moderate voltage (25-35 uV) seen predominantly in posterior head regions, symmetric and reactive to eye opening and eye closing. Sleep was characterized by vertex waves, sleep spindles (12  to 14 Hz), maximal frontocentral region.  EEG showed intermittent 3 to 6 Hz theta-delta slowing in right temporal region. Hyperventilation and photic stimulation were not performed.   ABNORMALITY - Intermittent slow, right temporal region IMPRESSION: This study is suggestive of cortical dysfunction arising from right temporal region likely secondary to underlying SDH. No seizures or epileptiform discharges were seen throughout the recording. Please note lack of epileptiform activity does not exclude the diagnosis of epilepsy. Priyanka Barbra Sarks   CT HEAD WO CONTRAST (5MM)  Result Date: 11/08/2021 CLINICAL DATA:  Follow-up subdural hematoma EXAM: CT HEAD WITHOUT CONTRAST TECHNIQUE: Contiguous axial images were obtained from the base of the skull through the vertex without intravenous contrast. RADIATION DOSE REDUCTION: This exam was performed according to the departmental dose-optimization program which includes automated exposure control, adjustment of the mA and/or kV according to patient size and/or use of iterative reconstruction technique. COMPARISON:  Yesterday FINDINGS: Brain: Mixed density subdural hematoma along the right cerebral convexity, primarily lateral but also minimally seen inferiorly. In the anterior frontal region maximal thickness is 9 mm. No progression when compared on coronal reformats. Mild right cerebral hemispheric mass effect. No infarct, hydrocephalus, or masslike finding. Vascular: No hyperdense vessel  or unexpected calcification. Skull: Normal. Negative for fracture or focal lesion. Sinuses/Orbits: No acute finding. IMPRESSION: Unchanged subdural hematoma along the right cerebral convexity measuring up to 9 mm at the anterior frontal lobe. Electronically Signed   By: Jorje Guild M.D.   On: 11/08/2021 10:12   CT Head Wo Contrast  Result Date: 11/07/2021 CLINICAL DATA:  Headache; seizure-like activity EXAM: CT HEAD WITHOUT CONTRAST TECHNIQUE: Contiguous axial images were obtained from the base of the skull through the vertex without intravenous contrast. RADIATION DOSE REDUCTION: This exam was performed according to the departmental dose-optimization program which includes automated exposure control, adjustment of the mA and/or kV according to patient size and/or use of iterative reconstruction technique. COMPARISON:  None Available. FINDINGS: Brain: Subdural hematoma overlying the right frontal convexity measuring up to 9 mm in greatest radial diameter. Adjacent mass effect on the right frontal lobe with mild loss of sulcation. 1-2 mm of leftward midline shift at the septum pellucidum. Basal cisterns are patent. Normal caliber of the ventricles. No evidence of acute infarct. Mild generalized cerebral atrophy. Mild chronic microvascular ischemic disease. Vascular: No hyperdense vessel or unexpected calcification. Skull: Normal. Negative for fracture or focal lesion. Sinuses/Orbits: No acute finding. Other: None. IMPRESSION: Acute subdural hematoma overlying the right frontal convexity measuring 9 mm in greatest radial diameter. Mild adjacent mass effect and 1-2 mm of leftward midline shift at the septum pellucidum. These results were called by telephone at the time of interpretation on 11/07/2021 at 10:00 pm to provider [facility] , who verbally acknowledged these results. Electronically Signed   By: Placido Sou M.D.   On: 11/07/2021 22:02    Labs:  Basic Metabolic Panel: Recent Labs  Lab  11/24/21 1610  NA 130*  K 5.0  CL 93*  CO2 26  GLUCOSE 103*  BUN 39*  CREATININE 11.57*  CALCIUM 8.9  PHOS 4.3    CBC: Recent Labs  Lab 11/24/21 1610 11/29/21 0618  WBC 3.5* 4.2  NEUTROABS  --  2.7  HGB 7.5* 8.0*  HCT 22.3* 23.9*  MCV 93.7 93.0  PLT 68* 81*    CBG: Recent Labs  Lab 11/28/21 2042 11/29/21 0524 11/29/21 1143 11/29/21 1652 11/29/21 2025  GLUCAP 101* 105* 116* 118* 109*    Brief HPI:  GARRICK MIDGLEY is a 61 y.o. right-handed male with history of end-stage renal disease status post nephrectomy, memory impairment chronic diastolic congestive heart failure, obesity with BMI 32.49 hypertension, chronic thrombocytopenia anxiety chronic headaches, OSA on CPAP, quit smoking 20 years ago, chronic bilateral upper extremity lower extremity weakness with history of posterior cervical fusion 07/11/2012 and 03/13/2021.  Per chart review lives with spouse and family.  Presented 11/07/2021 after reported seizure found by his wife lasting approximately 2 minutes.  There had been reports of headache for the past few days.  Admission chemistries unremarkable except glucose 149 BUN 40 creatinine 11.66 hemoglobin 10.1.  EEG showed no seizure.  Echocardiogram with ejection fraction of 50 to 55% no wall motion abnormalities grade 1 diastolic dysfunction.  Cranial CT scan showed acute subdural hematoma overlying the right frontal convexity measuring 9 mm in greatest radial diameter.  Mild adjacent mass effect and 1 to 2 mm of leftward midline shift at the septum pellucidum.  Follow-up neurosurgery no surgical intervention indicated follow-up serial CT/MRI with latest scan 11/16/2021 redemonstrating acute on chronic subdural hematoma along the right cerebral convexity measuring up to 10 mm with some sulcal effacement and 2 mm right to left midline shift.  Additional smaller subdural collection along the left cerebral convexity along the right tentorial leaflet and along the right inferior  temporal lobe measuring up to 5 mm.  Hospital course hemodialysis ongoing as indicated.  Maintained on Keppra for seizure prophylaxis.  Patient did spike a low-grade fever of 100.9 concern for SIRS placed on vancomycin cefepime and Flagyl.  Lactic acid levels within normal limits procalcitonin 0.58 chest x-ray suggestive of interstitial edema rather than infectious infiltrate.  2 out of 4 blood cultures from 10/25 positive for staph epidermidis likely contaminant and did complete course of antibiotic therapy no further fever noted.  On the afternoon of 11/15/2021 return from dialysis noted to be tachycardic EKG A-fib with RVR received dose of Cardizem converting back to sinus rhythm.  It was felt atrial fibrillation likely provoked due to inability to take medications prior to dialysis.  Patient tolerating a regular consistency diet.  Therapy evaluations completed due to patient decreased functional mobility was admitted for a comprehensive rehab program.   Hospital Course: Kirk [name] was admitted to rehab 11/21/2021 for inpatient therapies to consist of PT, ST and OT at least three hours five days a week. Past admission physiatrist, therapy team and rehab RN have worked together to provide customized collaborative inpatient rehab.  Pertaining to patient's subdural hematoma traumatic following seizure.  He was followed closely for question recurrent seizures Keppra dose adjusted EEG x2 negative and latest follow-up CT imaging revealed subacute/chronic subdural hematoma with mild local mass effect and approximate 1 mm of right to left midline shift no hydrocephalus.  Films were reviewed by neurosurgery plan for right middle meningeal artery embolization 11/30/2021.  All blood thinners were placed on hold.  Procedure discussed with family.  Patient was discharged to acute care services for ongoing medical management.  During his hospital rehab stay pain managed use of oxycodone and Robaxin he did have a history  of chronic headaches maintained on Lyrica.  History of memory impairment noted.  He was using trazodone for sleep.  PAF with RVR not a candidate for anticoagulation due to subdural hematoma heart rate controlled.  Blood pressure monitored on present regimen managed by nephrology services during hemodialysis.  Patient exhibited no other signs of fluid overload.  Chronic thrombocytopenia latest platelet count 68,000  Blood pressures were monitored on TID basis and controlled     Rehab course: During patient's stay in rehab weekly team conferences were held to monitor patient's progress, set goals and discuss barriers to discharge. At admission, patient required min guard stand pivot transfers min mod assist ambulate with a rolling walker  He/She  has had improvement in activity tolerance, balance, postural control as well as ability to compensate for deficits. He/She has had improvement in functional use RUE/LUE  and RLE/LLE as well as improvement in awareness.  Perform sit to stand and ambulatory stand pivot transfer from bed to wheelchair contact-guard minimal assist for balance.  Ambulates 125 feet bouts of ataxia.  Working with energy conservation techniques.  Patient can sit edge of bed for ADLs and completed sit to stand from edge of bed with bilateral knees noted to been spontaneously.       Disposition: Discharged to acute care services for right middle meningeal artery embolization    Diet: Renal diet 1200 mL fluid restriction.  Currently n.p.o. for procedure  Special Instructions: As dictated per neurosurgery  Medications at discharge 1.  Tylenol as needed 2.  Norvasc 10 mg p.o. daily 3.  Rocaltrol 1 mcg every Monday Wednesday Friday hemodialysis 4.  Vitamin D 4000 units p.o. daily 5.  Sensipar 60 mg p.o. 3 times weekly Monday Wednesday Friday 6.  Clonidine 0.2 mg every 4 hours as needed systolic blood pressure greater than 165 or diastolic greater than 790 7.  Cardura 4 mg  p.o. daily 8.  Mucinex 1200 mg p.o. twice daily 9.  Hydralazine 100 mg every 8 hours 10.  Imdur 60 mg p.o. daily 11.  Keppra 250 mg p.o. every Monday Wednesday Friday 12.  Keppra 750 mg p.o. daily 13.  Cozaar 100 mg p.o. every evening 14.  Robaxin 750 mg p.o. 4 times daily 15.  Lopressor 100 mg p.o. twice daily 16.  Rena-Vite 1 tablet p.o. daily 17.  Nitroglycerin as needed chest pain 18.  Oxycodone 5 to 10 mg every 4 hours as needed pain 19.  Protonix 40 mg p.o. twice daily 20.  Pravachol 40 mg p.o. daily 21.  Lyrica 75 mg p.o. daily 22.  Renvela 3200 mg p.o. 3 times daily with meals 23.  Carafate 1 g p.o. 3 times daily with meals and bedtime 24.  Trazodone 50 mg p.o. nightly  30-35 minutes were spent completing discharge summary and discharge planning      Follow-up Information     Kirsteins, Luanna Salk, MD Follow up.   Specialty: Physical Medicine and Rehabilitation Why: Office to call for appointment Contact information: [place] [address] [phone]         Dawley, Theodoro Doing, DO Follow up.   Why: Call for appointment Contact information: [address] [place] [address] [phone]                 Signed: Cathlyn Parsons 11/30/2021, 6:38 AM

## 2021-11-29 NOTE — Progress Notes (Signed)
Speech Language Pathology Daily Session Note  Patient Details  Name: Kirk [name] MRN: [id number] Date of Birth: Apr 18, 1960  Today's Date: 11/29/2021 SLP Individual Time: 3536-1443 SLP Individual Time Calculation (min): 45 min  Short Term Goals: Week 1: SLP Short Term Goal 1 (Week 1): Pt will demonstrate sustained attention/alertness in 1 minute interval with mod-min A verbal cues for redirection in 80% of interactions, SLP Short Term Goal 2 (Week 1): Pt will demonstrate recall of daily information with use of memory notebook with supervision A verbal cues. SLP Short Term Goal 3 (Week 1): Pt will demonstrate recall of daily information with use of internal strategies with mod-min A verbal cues. SLP Short Term Goal 4 (Week 1): Pt will demonstrate increase in safety awareness/judgement skills in fucntional scenarios with supervision A verbal cues.  Skilled Therapeutic Interventions: Skilled ST treatment focused on cognitive goals. Pt received in bed on arrival and complaining of 10/10 headache. Nurse arrived during session to administer medication. Pt participated in education regarding strategies to maximize attention re: minimizing external distractions; internal memory strategies re: verbal repetition, clarification, and association; external memory strategies re: written notes, calendar, alarms. Pt verbalized understanding through teach back. Pt demonstrated understanding of repetition strategy for working memory and attention task with overall min A verbal cues to recall up to 75% accuracy. Pt feeling nervous for his surgery tomorrow but stated he knows it's necessary and eager to return to rehab for more therapy so he can return home to be with wife and family. Pt requesting to conclude session 15 minutes early due to severity of headache. Was known to close eyes more frequently toward end of session and appeared to doze off to sleep on occasion.  Patient was left in bed with alarm activated and  immediate needs within reach at end of session. Continue per current plan of care.      Pain Pain Assessment Pain Scale: 0-10 Pain Score: 10-Worst pain ever Faces Pain Scale: Hurts worst Pain Type: Acute pain Pain Location: Head Pain Orientation: Anterior Pain Descriptors / Indicators: Headache Pain Frequency: Constant Pain Onset: On-going Pain Intervention(s): Medication (See eMAR) Multiple Pain Sites: No  Therapy/Group: Individual Therapy  Patty Sermons 11/29/2021, 8:14 AM
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
## 2021-11-29 NOTE — Progress Notes (Signed)
Orthopedic Tech Progress Note Patient Details:  Kirk [name] 1960/04/20 [id number]  OT reached out about order MD put in, will reach out to [facility] once order is updated   Patient ID: Kirk [name], male   DOB: [birth year], 61 y.o.   MRN: [id number]  [name] 11/29/2021, 12:32 PM

## 2021-11-29 NOTE — Plan of Care (Signed)
  Problem: RH Balance Goal: LTG Patient will maintain dynamic standing with ADLs (OT) Description: LTG:  Patient will maintain dynamic standing balance with assist during activities of daily living (OT)  Flowsheets (Taken 11/29/2021 1139) LTG: Pt will maintain dynamic standing balance during ADLs with: (LTG downgraded as pt has fluctuating strength, balance and coordination depending on his blood pressure and headaches. Pt has not demonstrated consistent performance to ensure he could do tasks at a modified independent level.) Supervision/Verbal cueing Note: LTG downgraded as pt has fluctuating strength, balance and coordination depending on his blood pressure and headaches. Pt has not demonstrated consistent performance to ensure he could do tasks at a modified independent level.    Problem: RH Bathing Goal: LTG Patient will bathe all body parts with assist levels (OT) Description: LTG: Patient will bathe all body parts with assist levels (OT) Flowsheets (Taken 11/29/2021 1139) LTG: Pt will perform bathing with assistance level/cueing: (LTG downgraded as pt has fluctuating strength, balance and coordination depending on his blood pressure and headaches. Pt has not demonstrated consistent performance to ensure he could do tasks at a modified independent level.) Supervision/Verbal cueing Note: LTG downgraded as pt has fluctuating strength, balance and coordination depending on his blood pressure and headaches. Pt has not demonstrated consistent performance to ensure he could do tasks at a modified independent level.    Problem: RH Dressing Goal: LTG Patient will perform lower body dressing w/assist (OT) Description: LTG: Patient will perform lower body dressing with assist, with/without cues in positioning using equipment (OT) Flowsheets (Taken 11/29/2021 1139) LTG: Pt will perform lower body dressing with assistance level of: (LTG downgraded as pt has fluctuating strength, balance and coordination  depending on his blood pressure and headaches. Pt has not demonstrated consistent performance to ensure he could do tasks at a modified independent level.) Supervision/Verbal cueing Note: LTG downgraded as pt has fluctuating strength, balance and coordination depending on his blood pressure and headaches. Pt has not demonstrated consistent performance to ensure he could do tasks at a modified independent level.    Problem: RH Toileting Goal: LTG Patient will perform toileting task (3/3 steps) with assistance level (OT) Description: LTG: Patient will perform toileting task (3/3 steps) with assistance level (OT)  Flowsheets (Taken 11/29/2021 1139) LTG: Pt will perform toileting task (3/3 steps) with assistance level: (LTG downgraded as pt has fluctuating strength, balance and coordination depending on his blood pressure and headaches. Pt has not demonstrated consistent performance to ensure he could do tasks at a modified independent level.) Supervision/Verbal cueing Note: LTG downgraded as pt has fluctuating strength, balance and coordination depending on his blood pressure and headaches. Pt has not demonstrated consistent performance to ensure he could do tasks at a modified independent level.

## 2021-11-29 NOTE — Progress Notes (Signed)
Physical Therapy Weekly Progress Note  Patient Details  Name: Kirk [name] MRN: [id number] Date of Birth: 09/24/1960  Beginning of progress report period: November 22, 2021 End of progress report period: November 29, 2021  Today's Date: 11/29/2021 PT Individual Time: 0902-0935 PT Individual Time Calculation (min): 33 min   Patient has met 4 of 4 short term goals.  Pt is making progress towards LTG of supervision assist overall with inconsistent functional status due to constant HA causing poor attention to task increased assist to prevent LOB. Wife has been present for 1 PT session to see patient perform gait training on cement sidewalk, but no hand on training to this point.  Patient continues to demonstrate the following deficits muscle weakness, decreased cardiorespiratoy endurance, decreased attention, decreased awareness, decreased problem solving, and decreased safety awareness, and decreased standing balance, decreased postural control, and decreased balance strategies and therefore will continue to benefit from skilled PT intervention to increase functional independence with mobility.  Patient progressing toward long term goals..  Continue plan of care.  PT Short Term Goals Week 1:  PT Short Term Goal 1 (Week 1): Pt will transfer to [facility] with CGA PT Short Term Goal 1 - Progress (Week 1): Met PT Short Term Goal 2 (Week 1): Pt will ambulate 145f with CGA PT Short Term Goal 2 - Progress (Week 1): Met PT Short Term Goal 3 (Week 1): Pt will propell WC >100 with supervision assist PT Short Term Goal 3 - Progress (Week 1): Met PT Short Term Goal 4 (Week 1): Pt will tolerate >2 hours out of bed between therapies PT Short Term Goal 4 - Progress (Week 1): Met Week 2:  PT Short Term Goal 1 (Week 2): STG=LTG due to ELOS  Skilled Therapeutic Interventions/Progress Updates:   Pt received supine in bed and agreeable to PT but reports 10/10 HA with nausea. BP assessment performed in supine:  159/54    Supine>sit transfer with supervision assist and cues for position of BLE on floot to improve stability. Mod assist to prevent posterior LOB initially once sitting EOB progressing to supervision assist from PT while obtaining seated BP assessment: 194/70. HR 63.   Sitting Balance EOB to attempt to eat breakfast supervision assist-CGA for ~ 15 minutes. Pt able to eat ~ 25% of meal then sudden significant increase in HA, causes near LOB with mod assist to prevent LOB. Pt returned to supine with supervision assist. BP assessed: 177/53. HR 54. Pt left with emesis bag in hand, supine in bed, call bell in reach and all needs met.         Therapy Documentation Precautions:  Precautions Precautions: Fall Precaution Comments: watch vitals, B knee buckling Restrictions Weight Bearing Restrictions: No    Vital Signs: Therapy Vitals Temp: 98.5 F (36.9 C) Temp Source: Oral Pulse Rate: (!) 59 Resp: 17 BP: (!) 155/67 Patient Position (if appropriate): Lying Oxygen Therapy SpO2: 99 % O2 Device: Room Air Pain: 10/10 HA in frontal/occipital region    Therapy/Group: Individual Therapy  ALorie Phenix11/08/2021, 5:19 PM

## 2021-11-29 NOTE — Progress Notes (Signed)
Inpatient Rehabilitation Care Coordinator Discharge Note   Patient Details  Name: ABISHAI VIEGAS MRN: [id number] Date of Birth: 1960/08/10   Discharge location: Surgery  Length of Stay: 9 Days  Discharge activity level: Sup-mod A  Home/community participation: spouse  Patient response PJ:ASNKNL Literacy - How often do you need to have someone help you when you read instructions, pamphlets, or other written material from your doctor or pharmacy?: Rarely  Patient response ZJ:QBHALP Isolation - How often do you feel lonely or isolated from those around you?: Rarely  Services provided included: MD, RD, PT, OT, SLP, CM, RN, TR, Pharmacy, Neuropsych, SW  Financial Services:  Charity fundraiser Utilized: Medicare    Choices offered to/list presented to: patient and spouse  Follow-up services arranged:              Patient response to transportation need: Is the patient able to respond to transportation needs?: Yes In the past 12 months, has lack of transportation kept you from medical appointments or from getting medications?: No In the past 12 months, has lack of transportation kept you from meetings, work, or from getting things needed for daily living?: No    Comments (or additional information):  Patient/Family verbalized understanding of follow-up arrangements:  Yes  Individual responsible for coordination of the follow-up plan: [name] [phone]  Confirmed correct DME delivered: Dyanne Iha 11/29/2021    Dyanne Iha

## 2021-11-29 NOTE — Progress Notes (Signed)
Patient ID: Kirk [name], male   DOB: [birth year], 61 y.o.   MRN: [id number] [address] [facility] Progress Note   Assessment/ Plan:   1.  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  Neurosurgery have scheduled him for right middle meningeal artery embolization tomorrow morning to attempt to avoid operative subdural hematoma evacuation with overnight observation in the acute hospital. 2. ESRD: Continue hemodialysis on MWF schedule with dialysis again later today.  Volume status appears close to euvolemic based on exam. 3. Anemia: Low hemoglobin and hematocrit, continue to monitor on darbepoetin for need to titrate this further. 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 5. Nutrition: Continue renal diet with ongoing supplementation as indicated. 6. Hypertension: Blood pressure elevated (likely exacerbated by headache), monitor with ongoing antihypertensive therapy and ultrafiltration with hemodialysis.  Subjective:   Complains of severe headache this morning and just got pain medication.  Awaiting transfer to acute hospital bed for MMA embolization tomorrow.   Objective:   BP (!) 177/71 (BP Location: Right Arm)   Pulse 60   Temp 98.9 F (37.2 C) (Oral)   Resp 15   Ht '5\' 7"'$  (1.702 m)   Wt 97.4 kg   SpO2 100%   BMI 33.63 kg/m   Physical Exam: Gen: Appears uncomfortable resting in bed CVS: Pulse regular rhythm, normal rate, S1 and S2 normal Resp: Clear to auscultation bilaterally, no distinct rales or rhonchi Abd: Soft, flat, nontender, bowel sounds normal Ext: Left upper extremity AV fistula with palpable thrill and bruit  Labs: BMET Recent Labs  Lab 11/24/21 1610  NA 130*  K 5.0  CL 93*  CO2 26  GLUCOSE 103*  BUN 39*  CREATININE 11.57*  CALCIUM 8.9  PHOS 4.3   CBC Recent Labs  Lab 11/24/21 1610 11/29/21 0618  WBC 3.5* 4.2  NEUTROABS  --  2.7   HGB 7.5* 8.0*  HCT 22.3* 23.9*  MCV 93.7 93.0  PLT 68* 81*      Medications:     amLODipine  10 mg Oral QPM   calcitRIOL  1 mcg Oral Q M,W,F-HD   cholecalciferol  4,000 Units Oral Daily   cinacalcet  60 mg Oral Once per day on Mon Wed Fri   darbepoetin (ARANESP) injection - DIALYSIS  60 mcg Subcutaneous Q Sat-1800   doxazosin  4 mg Oral Daily   guaiFENesin  1,200 mg Oral BID   hydrALAZINE  100 mg Oral Q8H   isosorbide mononitrate  60 mg Oral Daily   levETIRAcetam  250 mg Oral Q M,W,F-HD   levETIRAcetam  750 mg Oral Daily   losartan  100 mg Oral QPM   methocarbamol  750 mg Oral QID   metoprolol tartrate  100 mg Oral BID   multivitamin  1 tablet Oral Daily   pantoprazole  40 mg Oral BID AC   pravastatin  40 mg Oral Daily   pregabalin  75 mg Oral Daily   senna-docusate  1 tablet Oral BID   sevelamer carbonate  3,200 mg Oral TID WC   sucralfate  1 g Oral TID WC & HS   traZODone  50 mg Oral QHS     Elmarie Shiley, MD 11/29/2021, 10:02 AM

## 2021-11-29 NOTE — Progress Notes (Signed)
  NEUROSURGERY PROGRESS NOTE   No issues overnight. Pt without new issues this am, working with SLP currently.   EXAM:  BP (!) 177/71 (BP Location: Right Arm)   Pulse 60   Temp 98.9 F (37.2 C) (Oral)   Resp 15   Ht '5\' 7"'$  (1.702 m)   Wt 97.4 kg   SpO2 100%   BMI 33.63 kg/m   Awake, alert, oriented  Speech fluent, appropriate  Good strength throughout  IMPRESSION:  61 y.o. male with enlarging subacute/chronic right convexity SDH.  PLAN: - Right MMA embolization scheduled tomorrow ~11am. Assuming no complication, would plan on single overnight inpatient stay postop.  Pt is ready to proceed with procedure tomorrow. All questions were answered.   Consuella Lose, MD [facility]

## 2021-11-29 NOTE — Progress Notes (Signed)
Occupational Therapy Session Note  Patient Details  Name: Kirk [name] MRN: [id number] Date of Birth: 05/25/1960  Today's Date: 11/29/2021 OT Individual Time: 3582-5189 OT Individual Time Calculation (min): 20 min  10 mins missed d/t nausea, HA and hypotension   Short Term Goals: Week 1:  OT Short Term Goal 1 (Week 1): Pt will be able to ambulate to toilet with LRAD with light CGA. OT Short Term Goal 2 (Week 1): Pt will demonstrate improved standing balance with donning pants with close S with no posterior lean. OT Short Term Goal 3 (Week 1): Pt will learn gentle self ROM exercises he can do for his B shoulders.  Skilled Therapeutic Interventions/Progress Updates:  Pt greeted supine in bed, reporting not feeling well but agreeable to try. Per notes with PT earlier pt sat EOB but had increase in HA needing to lay back down.  BP from supine- 158/62 ( 87) HR 60  Pt needed CGA to transition to EOB, BP assessed form EOB BP- 135/64 ( 85) HR 60 pt reports dizziness from sitting EOB noted to Lean to R side.   D/t drop in BP pt returned to supine. Alerted RN, pt missed 10 mins d/t low BP, nausea and HA.  Left session with pt in supine, bed alarm set and all needs within reach.   Therapy Documentation Precautions:  Precautions Precautions: Fall Precaution Comments: watch vitals, B knee buckling Restrictions Weight Bearing Restrictions: No General: General OT Amount of Missed Time: 10 Minutes  Pain: Unrated pain reported d/t HA, terminated session for safety and pain mgmt    Therapy/Group: Individual Therapy  Corinne Ports [facility] 11/29/2021, 4:00 PM

## 2021-11-29 NOTE — Progress Notes (Signed)
Inpatient Rehabilitation Discharge Medication Review by a Pharmacist  A complete drug regimen review was completed for this patient to identify any potential clinically significant medication issues.  High Risk Drug Classes Is patient taking? Indication by Medication  Antipsychotic No   Anticoagulant No   Antibiotic No   Opioid Yes Oxycodone - prn pain  Antiplatelet No   Hypoglycemics/insulin No   Vasoactive Medication Yes Clonidine, losartan, amlodipine, imdur, hydralazine, doxazosin - BP   Chemotherapy No   Other Yes Aranesp - anemia Keppra - seizures Pantoprazole, sucralfate - Reflux Pregabalin - Pain Pravastatin - HLD  Cinacalcet, Sevelamer, Calcitriol- ESRD replacement  Methocarbamol - muscle spasms  Trazodone - sleep  Nitro prn CP     Type of Medication Issue Identified Description of Issue Recommendation(s)  Drug Interaction(s) (clinically significant)     Duplicate Therapy     Allergy     No Medication Administration End Date     Incorrect Dose     Additional Drug Therapy Needed     Significant med changes from prior encounter (inform family/care partners about these prior to discharge).    Other       Clinically significant medication issues were identified that warrant physician communication and completion of prescribed/recommended actions by midnight of the next day:  No  Pharmacist comments: None  Time spent performing this drug regimen review (minutes):  20 minutes  Thank you Anette Guarneri, PharmD

## 2021-11-29 NOTE — Patient Care Conference (Signed)
Inpatient RehabilitationTeam Conference and Plan of Care Update Date: 11/29/2021   Time: 10:08 AM    Patient Name: Kirk [name]      Medical Record Number: [id number]  Date of Birth: [birth year] Sex: Male         Room/Bed: 4M08C/4M08C-01 Payor Info: Payor: MEDICARE / Plan: MEDICARE PART A AND B / Product Type: *No Product type* /    Admit Date/Time:  11/21/2021  4:19 PM  Primary Diagnosis:  Traumatic subdural hematoma (SDH) [facility])  Hospital Problems: Principal Problem:   Traumatic subdural hematoma (SDH) ([place]) Active Problems:   Uncontrolled hypertension   ESRD on dialysis [facility])   Chronic systolic heart failure [facility])    Expected Discharge Date: Expected Discharge Date: 11/30/21  Team Members Present: Physician leading conference: Dr. Alysia Penna Social Worker Present: Erlene Quan, BSW Nurse Present: Dorien Chihuahua, RN PT Present: Barrie Folk, PT OT Present: Meriel Pica, OT SLP Present: Sherren Kerns, SLP PPS Coordinator present : Gunnar Fusi, SLP     Current Status/Progress Goal Weekly Team Focus  Bowel/Bladder   Patient is continent of bowel/oliguria (urine) Dialysis MWF   Patient will remain continent of bowel      toileting  Swallow/Nutrition/ Hydration               ADL's   supervision - based on pt report, he has continually declined doing self care with OT and prefers his wife helping him in the evenings.  To reach mod I pt needs to demonstrate mod I with balance and ambulation to toilet   mod I with dressing and toileting, supervision with toilet transfers, set up sponge bath   ADL training, functional mobility, NMR, pt/fam education    Mobility   Continues to have brief seizure activity, episodes of falling asleep during talking/ therapy sessions; bed mobility = sup/ Mod I, transfers = supervision, amulation = close supervision/ intermittent CGA - keeping distances shorter to decrease pt's fatigue   Bed mobility at mod I, transfers at  supervision/ CGA, ambulation at supervision, stairs at [facility]  activity tolerance, balance, strength, ambulation, family education    Communication                Safety/Cognition/ Behavioral Observations  Min A to Min-Mod A   Sup A   recall, awareness, and attention    Pain   Denies pain at this times   Patient will remain free of pain        Skin   No evidence of breakdown, no evidence of rash   Skin will remain free from breakdowns.         Discharge Planning:  Discharging home with spouse able to provide assistance up to [place], 24/7   Team Discussion: Patient with MMA embolization scheduled for 11/30/21; 24 hour on acute and plan to return to CIR if he does well post op with discharge home scheduled for 12/02/21. Keppra changed to po route.   Patient on target to meet rehab goals: yes, patient functional status fluctuates with headaches and pain. Progress limited by mild cognitive deficits and behavior, decreased alertness and difficulty concentrating.  *See Care Plan and progress notes for long and short-term goals.   Revisions to Treatment Plan:  N/a   Teaching Needs: Safety, medications, transfers, toileting, etc.  Current Barriers to Discharge: Inaccessible home environment, Renal Insufficiency/Failure, and HD M-W-F @ [place] [facility], 10 ste  Possible Resolutions to Barriers: Family education HH follow up services No DME needs  Medical Summary Current Status: no seizures for >48h, has acute on chronic SDH, prior TBI  Barriers to Discharge: Medical stability;Medication compliance   Possible Resolutions to [employer] Focus: discharge to acute for MMA embolization, Neuro assisting with Sz d/o , likely has non epileptogenic spells   Continued Need for Acute Rehabilitation Level of Care: The patient requires daily medical management by a physician with specialized training in physical medicine and rehabilitation for the following reasons: Direction of a  multidisciplinary physical rehabilitation program to maximize functional independence : Yes Medical management of patient stability for increased activity during participation in an intensive rehabilitation regime.: Yes Analysis of laboratory values and/or radiology reports with any subsequent need for medication adjustment and/or medical intervention. : Yes   I attest that I was present, lead the team conference, and concur with the assessment and plan of the team.   Dorien Chihuahua B 11/29/2021, 1:46 PM

## 2021-11-29 NOTE — Progress Notes (Signed)
Occupational Therapy Note  Patient Details  Name: Kirk [name] MRN: [id number] Date of Birth: 1960-12-31  Today's Date: 11/29/2021 OT Missed Time: 67 Minutes Missed Time Reason: Patient ill (comment) (nausea)  Pt received in bed with cpap on. Had pt remove it so he could engage in therapy. Pt immediately stated he was nauseous and could not participate.  Showed pt the resting hand splint that was brought to his room. Pt had B splints ordered but only 2 R splints present. Splints ordered as pt stated he has hand cramping at night.  Pt looked at R hand splint and said "oh I wont wear that".  "Besides only my L hand hurts".  Pt has a trigger finger in middle digit and would benefit from a ring splint.  This could be an out pt referral for hand therapy.  Will also provide wife with information on how to order one online.   Pt continued to state he was nauseated and could not participate.    [place] 11/29/2021, 11:27 AM

## 2021-11-30 ENCOUNTER — Inpatient Hospital Stay (HOSPITAL_COMMUNITY): Payer: Medicare Other | Admitting: Certified Registered Nurse Anesthetist

## 2021-11-30 ENCOUNTER — Encounter (HOSPITAL_COMMUNITY)
Admission: RE | Disposition: A | Discharge status: Other Acute Inpatient Hospital | Payer: Self-pay | Source: Intra-hospital | Attending: Physical Medicine & Rehabilitation

## 2021-11-30 ENCOUNTER — Encounter (HOSPITAL_COMMUNITY)
Admission: RE | Disposition: A | Discharge status: Home / Self Care | Payer: Self-pay | Source: Ambulatory Visit | Attending: Neurosurgery

## 2021-11-30 ENCOUNTER — Other Ambulatory Visit (HOSPITAL_COMMUNITY): Payer: Self-pay

## 2021-11-30 ENCOUNTER — Inpatient Hospital Stay (HOSPITAL_COMMUNITY): Payer: Medicare Other

## 2021-11-30 ENCOUNTER — Other Ambulatory Visit: Payer: Self-pay

## 2021-11-30 ENCOUNTER — Inpatient Hospital Stay (HOSPITAL_COMMUNITY)
Admission: RE | Admit: 2021-11-30 | Discharge: 2021-12-01 | DRG: 020 | Disposition: A | Discharge status: Home / Self Care | Payer: Medicare Other | Source: Ambulatory Visit | Attending: Neurosurgery | Admitting: Neurosurgery

## 2021-11-30 ENCOUNTER — Other Ambulatory Visit (HOSPITAL_COMMUNITY): Payer: Medicare Other

## 2021-11-30 ENCOUNTER — Encounter (HOSPITAL_COMMUNITY): Payer: Self-pay

## 2021-11-30 DIAGNOSIS — D631 Anemia in chronic kidney disease: Secondary | ICD-10-CM | POA: Diagnosis present

## 2021-11-30 DIAGNOSIS — I120 Hypertensive chronic kidney disease with stage 5 chronic kidney disease or end stage renal disease: Secondary | ICD-10-CM | POA: Diagnosis present

## 2021-11-30 DIAGNOSIS — G4700 Insomnia, unspecified: Secondary | ICD-10-CM | POA: Diagnosis present

## 2021-11-30 DIAGNOSIS — S065X1A Traumatic subdural hemorrhage with loss of consciousness of 30 minutes or less, initial encounter: Principal | ICD-10-CM

## 2021-11-30 DIAGNOSIS — I5032 Chronic diastolic (congestive) heart failure: Secondary | ICD-10-CM | POA: Diagnosis not present

## 2021-11-30 DIAGNOSIS — D638 Anemia in other chronic diseases classified elsewhere: Secondary | ICD-10-CM | POA: Diagnosis not present

## 2021-11-30 DIAGNOSIS — I6201 Nontraumatic acute subdural hemorrhage: Secondary | ICD-10-CM | POA: Diagnosis not present

## 2021-11-30 DIAGNOSIS — S065XAA Traumatic subdural hemorrhage with loss of consciousness status unknown, initial encounter: Secondary | ICD-10-CM | POA: Diagnosis not present

## 2021-11-30 DIAGNOSIS — J4540 Moderate persistent asthma, uncomplicated: Secondary | ICD-10-CM | POA: Diagnosis present

## 2021-11-30 DIAGNOSIS — Z886 Allergy status to analgesic agent status: Secondary | ICD-10-CM

## 2021-11-30 DIAGNOSIS — Z87891 Personal history of nicotine dependence: Secondary | ICD-10-CM | POA: Diagnosis not present

## 2021-11-30 DIAGNOSIS — Z79899 Other long term (current) drug therapy: Secondary | ICD-10-CM

## 2021-11-30 DIAGNOSIS — I6200 Nontraumatic subdural hemorrhage, unspecified: Secondary | ICD-10-CM | POA: Diagnosis present

## 2021-11-30 DIAGNOSIS — N400 Enlarged prostate without lower urinary tract symptoms: Secondary | ICD-10-CM | POA: Diagnosis present

## 2021-11-30 DIAGNOSIS — K769 Liver disease, unspecified: Secondary | ICD-10-CM | POA: Diagnosis present

## 2021-11-30 DIAGNOSIS — Z940 Kidney transplant status: Secondary | ICD-10-CM | POA: Diagnosis not present

## 2021-11-30 DIAGNOSIS — G44209 Tension-type headache, unspecified, not intractable: Secondary | ICD-10-CM | POA: Diagnosis present

## 2021-11-30 DIAGNOSIS — E785 Hyperlipidemia, unspecified: Secondary | ICD-10-CM | POA: Diagnosis present

## 2021-11-30 DIAGNOSIS — E876 Hypokalemia: Secondary | ICD-10-CM | POA: Diagnosis not present

## 2021-11-30 DIAGNOSIS — N529 Male erectile dysfunction, unspecified: Secondary | ICD-10-CM | POA: Diagnosis present

## 2021-11-30 DIAGNOSIS — K219 Gastro-esophageal reflux disease without esophagitis: Secondary | ICD-10-CM | POA: Diagnosis present

## 2021-11-30 DIAGNOSIS — G4733 Obstructive sleep apnea (adult) (pediatric): Secondary | ICD-10-CM | POA: Diagnosis present

## 2021-11-30 DIAGNOSIS — M109 Gout, unspecified: Secondary | ICD-10-CM | POA: Diagnosis present

## 2021-11-30 DIAGNOSIS — I671 Cerebral aneurysm, nonruptured: Secondary | ICD-10-CM | POA: Diagnosis present

## 2021-11-30 DIAGNOSIS — G609 Hereditary and idiopathic neuropathy, unspecified: Secondary | ICD-10-CM | POA: Diagnosis present

## 2021-11-30 DIAGNOSIS — Z992 Dependence on renal dialysis: Secondary | ICD-10-CM | POA: Diagnosis not present

## 2021-11-30 DIAGNOSIS — N186 End stage renal disease: Secondary | ICD-10-CM | POA: Diagnosis present

## 2021-11-30 DIAGNOSIS — I6203 Nontraumatic chronic subdural hemorrhage: Principal | ICD-10-CM | POA: Diagnosis present

## 2021-11-30 DIAGNOSIS — G40209 Localization-related (focal) (partial) symptomatic epilepsy and epileptic syndromes with complex partial seizures, not intractable, without status epilepticus: Secondary | ICD-10-CM | POA: Diagnosis present

## 2021-11-30 DIAGNOSIS — S065X1S Traumatic subdural hemorrhage with loss of consciousness of 30 minutes or less, sequela: Secondary | ICD-10-CM | POA: Diagnosis not present

## 2021-11-30 DIAGNOSIS — Z8701 Personal history of pneumonia (recurrent): Secondary | ICD-10-CM

## 2021-11-30 DIAGNOSIS — N2581 Secondary hyperparathyroidism of renal origin: Secondary | ICD-10-CM | POA: Diagnosis present

## 2021-11-30 DIAGNOSIS — I132 Hypertensive heart and chronic kidney disease with heart failure and with stage 5 chronic kidney disease, or end stage renal disease: Secondary | ICD-10-CM | POA: Diagnosis not present

## 2021-11-30 DIAGNOSIS — R569 Unspecified convulsions: Secondary | ICD-10-CM | POA: Diagnosis not present

## 2021-11-30 DIAGNOSIS — J45909 Unspecified asthma, uncomplicated: Secondary | ICD-10-CM | POA: Diagnosis not present

## 2021-11-30 DIAGNOSIS — S066X0A Traumatic subarachnoid hemorrhage without loss of consciousness, initial encounter: Secondary | ICD-10-CM | POA: Diagnosis not present

## 2021-11-30 DIAGNOSIS — F419 Anxiety disorder, unspecified: Secondary | ICD-10-CM | POA: Diagnosis present

## 2021-11-30 DIAGNOSIS — Z885 Allergy status to narcotic agent status: Secondary | ICD-10-CM

## 2021-11-30 DIAGNOSIS — Z9989 Dependence on other enabling machines and devices: Secondary | ICD-10-CM | POA: Diagnosis not present

## 2021-11-30 DIAGNOSIS — K5900 Constipation, unspecified: Secondary | ICD-10-CM | POA: Diagnosis present

## 2021-11-30 HISTORY — DX: Nausea with vomiting, unspecified: Z98.890

## 2021-11-30 HISTORY — DX: Nausea with vomiting, unspecified: R11.2

## 2021-11-30 HISTORY — PX: IR ANGIO INTRA EXTRACRAN SEL INTERNAL CAROTID UNI R MOD SED: IMG5362

## 2021-11-30 HISTORY — PX: RADIOLOGY WITH ANESTHESIA: SHX6223

## 2021-11-30 HISTORY — PX: IR ANGIOGRAM FOLLOW UP STUDY: IMG697

## 2021-11-30 HISTORY — PX: IR TRANSCATH/EMBOLIZ: IMG695

## 2021-11-30 HISTORY — PX: IR NEURO EACH ADD'L AFTER BASIC UNI RIGHT (MS): IMG5374

## 2021-11-30 LAB — CBC
HCT: 22.4 % — ABNORMAL LOW (ref 39.0–52.0)
Hemoglobin: 7.2 g/dL — ABNORMAL LOW (ref 13.0–17.0)
MCH: 30.8 pg (ref 26.0–34.0)
MCHC: 32.1 g/dL (ref 30.0–36.0)
MCV: 95.7 fL (ref 80.0–100.0)
Platelets: 73 10*3/uL — ABNORMAL LOW (ref 150–400)
RBC: 2.34 MIL/uL — ABNORMAL LOW (ref 4.22–5.81)
RDW: 14 % (ref 11.5–15.5)
WBC: 3.5 10*3/uL — ABNORMAL LOW (ref 4.0–10.5)
nRBC: 0 % (ref 0.0–0.2)

## 2021-11-30 LAB — CREATININE, SERUM
Creatinine, Ser: 9.55 mg/dL — ABNORMAL HIGH (ref 0.61–1.24)
GFR, Estimated: 6 mL/min — ABNORMAL LOW (ref >60)

## 2021-11-30 LAB — GLUCOSE, CAPILLARY
Glucose-Capillary: 89 mg/dL (ref 70–99)
Glucose-Capillary: 98 mg/dL (ref 70–99)

## 2021-11-30 SURGERY — IR WITH ANESTHESIA
Anesthesia: General

## 2021-11-30 MED ORDER — ONDANSETRON HCL 4 MG/2ML IJ SOLN
INTRAMUSCULAR | Status: AC
  Filled 2021-11-30: qty 2

## 2021-11-30 MED ORDER — SUGAMMADEX SODIUM 200 MG/2ML IV SOLN
INTRAVENOUS | Status: DC | PRN
  Administered 2021-11-30: 200 mg via INTRAVENOUS

## 2021-11-30 MED ORDER — SUCRALFATE 1 GM/10ML PO SUSP: 1.0000 g | Freq: Three times a day (TID) | ORAL | Status: DC

## 2021-11-30 MED ORDER — SODIUM CHLORIDE 0.9 % IV SOLN: INTRAVENOUS | Status: DC

## 2021-11-30 MED ORDER — FENTANYL CITRATE (PF) 100 MCG/2ML IJ SOLN
INTRAMUSCULAR | Status: AC
  Filled 2021-11-30: qty 2

## 2021-11-30 MED ORDER — CINACALCET HCL 30 MG PO TABS
60.0000 mg | ORAL_TABLET | ORAL | Status: DC
  Administered 2021-12-01: 60 mg via ORAL
  Filled 2021-11-30: qty 2

## 2021-11-30 MED ORDER — ONDANSETRON HCL 4 MG PO TABS
4.0000 mg | ORAL_TABLET | ORAL | Status: DC | PRN
  Administered 2021-11-30: 4 mg via ORAL
  Filled 2021-11-30: qty 1

## 2021-11-30 MED ORDER — ALBUMIN HUMAN 5 % IV SOLN: INTRAVENOUS | Status: DC | PRN

## 2021-11-30 MED ORDER — DARBEPOETIN ALFA 60 MCG/0.3ML IJ SOSY: 60.0000 ug | PREFILLED_SYRINGE | INTRAMUSCULAR | Status: DC

## 2021-11-30 MED ORDER — OXYCODONE HCL 5 MG PO TABS: 5.0000 mg | ORAL_TABLET | Freq: Once | ORAL | Status: AC | PRN

## 2021-11-30 MED ORDER — AMISULPRIDE (ANTIEMETIC) 5 MG/2ML IV SOLN: 10.0000 mg | Freq: Once | INTRAVENOUS | Status: DC | PRN

## 2021-11-30 MED ORDER — PANTOPRAZOLE SODIUM 40 MG PO TBEC
40.0000 mg | DELAYED_RELEASE_TABLET | Freq: Two times a day (BID) | ORAL | Status: DC
  Administered 2021-11-30 – 2021-12-01 (×2): 40 mg via ORAL
  Filled 2021-11-30 (×3): qty 1

## 2021-11-30 MED ORDER — PRAVASTATIN SODIUM 40 MG PO TABS
40.0000 mg | ORAL_TABLET | Freq: Every day | ORAL | Status: DC
  Administered 2021-12-01: 40 mg via ORAL
  Filled 2021-11-30 (×2): qty 1

## 2021-11-30 MED ORDER — OXYCODONE HCL 5 MG/5ML PO SOLN
5.0000 mg | Freq: Once | ORAL | Status: AC | PRN
  Administered 2021-11-30: 5 mg via ORAL

## 2021-11-30 MED ORDER — ACETAMINOPHEN 500 MG PO TABS
1000.0000 mg | ORAL_TABLET | ORAL | Status: AC
  Administered 2021-11-30: 1000 mg via ORAL

## 2021-11-30 MED ORDER — ORAL CARE MOUTH RINSE: 15.0000 mL | Freq: Once | OROMUCOSAL | Status: AC

## 2021-11-30 MED ORDER — CLEVIDIPINE BUTYRATE 0.5 MG/ML IV EMUL
INTRAVENOUS | Status: AC
  Filled 2021-11-30: qty 50

## 2021-11-30 MED ORDER — VITAMIN D 25 MCG (1000 UNIT) PO TABS
4000.0000 [IU] | ORAL_TABLET | Freq: Every day | ORAL | Status: DC
  Administered 2021-12-01: 4000 [IU] via ORAL
  Filled 2021-11-30 (×2): qty 4

## 2021-11-30 MED ORDER — FENTANYL CITRATE (PF) 250 MCG/5ML IJ SOLN
INTRAMUSCULAR | Status: DC | PRN
  Administered 2021-11-30 (×2): 50 ug via INTRAVENOUS
  Administered 2021-11-30: 100 ug via INTRAVENOUS

## 2021-11-30 MED ORDER — LIDOCAINE 2% (20 MG/ML) 5 ML SYRINGE
INTRAMUSCULAR | Status: AC
  Filled 2021-11-30: qty 5

## 2021-11-30 MED ORDER — SEVELAMER CARBONATE 800 MG PO TABS
3200.0000 mg | ORAL_TABLET | Freq: Three times a day (TID) | ORAL | Status: DC
  Filled 2021-11-30: qty 4

## 2021-11-30 MED ORDER — RENA-VITE PO TABS
1.0000 | ORAL_TABLET | Freq: Every day | ORAL | Status: DC
  Administered 2021-12-01: 1 via ORAL
  Filled 2021-11-30 (×2): qty 1

## 2021-11-30 MED ORDER — IOHEXOL 300 MG/ML  SOLN: 100.0000 mL | Freq: Once | INTRAMUSCULAR | Status: DC | PRN

## 2021-11-30 MED ORDER — ISOSORBIDE MONONITRATE ER 60 MG PO TB24
60.0000 mg | ORAL_TABLET | Freq: Every day | ORAL | Status: DC
  Administered 2021-11-30 – 2021-12-01 (×2): 60 mg via ORAL
  Filled 2021-11-30 (×3): qty 1

## 2021-11-30 MED ORDER — CLONIDINE HCL 0.1 MG PO TABS: 0.2000 mg | ORAL_TABLET | ORAL | Status: DC | PRN

## 2021-11-30 MED ORDER — ACETAMINOPHEN 500 MG PO TABS
ORAL_TABLET | ORAL | Status: AC
  Filled 2021-11-30: qty 2

## 2021-11-30 MED ORDER — LEVETIRACETAM 750 MG PO TABS
750.0000 mg | ORAL_TABLET | Freq: Every day | ORAL | Status: DC
  Administered 2021-11-30 – 2021-12-01 (×2): 750 mg via ORAL
  Filled 2021-11-30 (×3): qty 1

## 2021-11-30 MED ORDER — CHLORHEXIDINE GLUCONATE CLOTH 2 % EX PADS: 6.0000 | MEDICATED_PAD | Freq: Every day | CUTANEOUS | Status: DC

## 2021-11-30 MED ORDER — ROCURONIUM BROMIDE 10 MG/ML (PF) SYRINGE
PREFILLED_SYRINGE | INTRAVENOUS | Status: AC
  Filled 2021-11-30: qty 20

## 2021-11-30 MED ORDER — CHLORHEXIDINE GLUCONATE 0.12 % MT SOLN: 15.0000 mL | Freq: Once | OROMUCOSAL | Status: AC

## 2021-11-30 MED ORDER — GUAIFENESIN ER 600 MG PO TB12
1200.0000 mg | ORAL_TABLET | Freq: Two times a day (BID) | ORAL | Status: DC
  Administered 2021-11-30: 1200 mg via ORAL
  Filled 2021-11-30 (×2): qty 2

## 2021-11-30 MED ORDER — PHENYLEPHRINE HCL-NACL 20-0.9 MG/250ML-% IV SOLN
INTRAVENOUS | Status: DC | PRN
  Administered 2021-11-30: 60 ug/min via INTRAVENOUS

## 2021-11-30 MED ORDER — ONDANSETRON HCL 4 MG/2ML IJ SOLN
INTRAMUSCULAR | Status: DC | PRN
  Administered 2021-11-30: 4 mg via INTRAVENOUS

## 2021-11-30 MED ORDER — CHLORHEXIDINE GLUCONATE 0.12 % MT SOLN
OROMUCOSAL | Status: AC
  Administered 2021-11-30: 15 mL via OROMUCOSAL
  Filled 2021-11-30: qty 15

## 2021-11-30 MED ORDER — FENTANYL CITRATE (PF) 100 MCG/2ML IJ SOLN
25.0000 ug | INTRAMUSCULAR | Status: DC | PRN
  Administered 2021-11-30 (×3): 50 ug via INTRAVENOUS

## 2021-11-30 MED ORDER — MIDAZOLAM HCL 2 MG/2ML IJ SOLN
INTRAMUSCULAR | Status: AC
  Filled 2021-11-30: qty 2

## 2021-11-30 MED ORDER — OXYCODONE HCL 5 MG PO TABS
5.0000 mg | ORAL_TABLET | ORAL | Status: DC | PRN
  Administered 2021-11-30 – 2021-12-01 (×3): 10 mg via ORAL
  Administered 2021-12-01: 5 mg via ORAL
  Filled 2021-11-30: qty 2
  Filled 2021-11-30: qty 1
  Filled 2021-11-30 (×2): qty 2

## 2021-11-30 MED ORDER — EPHEDRINE SULFATE-NACL 50-0.9 MG/10ML-% IV SOSY
PREFILLED_SYRINGE | INTRAVENOUS | Status: DC | PRN
  Administered 2021-11-30: 5 mg via INTRAVENOUS

## 2021-11-30 MED ORDER — HYDRALAZINE HCL 50 MG PO TABS
100.0000 mg | ORAL_TABLET | Freq: Three times a day (TID) | ORAL | Status: DC
  Administered 2021-11-30 – 2021-12-01 (×3): 100 mg via ORAL
  Filled 2021-11-30 (×5): qty 2

## 2021-11-30 MED ORDER — LEVETIRACETAM 250 MG PO TABS: 250.0000 mg | ORAL_TABLET | ORAL | Status: DC

## 2021-11-30 MED ORDER — CLEVIDIPINE BUTYRATE 0.5 MG/ML IV EMUL
INTRAVENOUS | Status: DC | PRN
  Administered 2021-11-30: 4 mg/h via INTRAVENOUS

## 2021-11-30 MED ORDER — DEXAMETHASONE SODIUM PHOSPHATE 10 MG/ML IJ SOLN
INTRAMUSCULAR | Status: DC | PRN
  Administered 2021-11-30: 10 mg via INTRAVENOUS

## 2021-11-30 MED ORDER — SENNOSIDES-DOCUSATE SODIUM 8.6-50 MG PO TABS
1.0000 | ORAL_TABLET | Freq: Two times a day (BID) | ORAL | Status: DC
  Filled 2021-11-30: qty 1

## 2021-11-30 MED ORDER — DEXAMETHASONE SODIUM PHOSPHATE 10 MG/ML IJ SOLN
INTRAMUSCULAR | Status: AC
  Filled 2021-11-30: qty 1

## 2021-11-30 MED ORDER — DOXAZOSIN MESYLATE 4 MG PO TABS
4.0000 mg | ORAL_TABLET | Freq: Every day | ORAL | Status: DC
  Administered 2021-11-30 – 2021-12-01 (×2): 4 mg via ORAL
  Filled 2021-11-30 (×2): qty 1

## 2021-11-30 MED ORDER — PROPOFOL 10 MG/ML IV BOLUS
INTRAVENOUS | Status: AC
  Filled 2021-11-30: qty 20

## 2021-11-30 MED ORDER — ROCURONIUM BROMIDE 10 MG/ML (PF) SYRINGE
PREFILLED_SYRINGE | INTRAVENOUS | Status: DC | PRN
  Administered 2021-11-30: 70 mg via INTRAVENOUS

## 2021-11-30 MED ORDER — EPHEDRINE 5 MG/ML INJ
INTRAVENOUS | Status: AC
  Filled 2021-11-30: qty 5

## 2021-11-30 MED ORDER — CALCITRIOL 0.25 MCG PO CAPS
1.0000 ug | ORAL_CAPSULE | ORAL | Status: DC
  Administered 2021-12-01: 1 ug via ORAL
  Filled 2021-11-30: qty 4
  Filled 2021-11-30: qty 2

## 2021-11-30 MED ORDER — TRAZODONE HCL 50 MG PO TABS
50.0000 mg | ORAL_TABLET | Freq: Every day | ORAL | Status: DC
  Administered 2021-11-30: 50 mg via ORAL
  Filled 2021-11-30: qty 1

## 2021-11-30 MED ORDER — METOPROLOL TARTRATE 50 MG PO TABS
100.0000 mg | ORAL_TABLET | Freq: Two times a day (BID) | ORAL | Status: DC
  Administered 2021-11-30 – 2021-12-01 (×2): 100 mg via ORAL
  Filled 2021-11-30: qty 1
  Filled 2021-11-30 (×2): qty 2

## 2021-11-30 MED ORDER — NITROGLYCERIN 0.4 MG SL SUBL: 0.4000 mg | SUBLINGUAL_TABLET | SUBLINGUAL | Status: DC | PRN

## 2021-11-30 MED ORDER — LIDOCAINE 2% (20 MG/ML) 5 ML SYRINGE
INTRAMUSCULAR | Status: DC | PRN
  Administered 2021-11-30: 100 mg via INTRAVENOUS

## 2021-11-30 MED ORDER — ACETAMINOPHEN 500 MG PO TABS: 500.0000 mg | ORAL_TABLET | ORAL | Status: DC

## 2021-11-30 MED ORDER — FENTANYL CITRATE (PF) 250 MCG/5ML IJ SOLN
INTRAMUSCULAR | Status: AC
  Filled 2021-11-30: qty 5

## 2021-11-30 MED ORDER — SEVELAMER CARBONATE 800 MG PO TABS
1600.0000 mg | ORAL_TABLET | ORAL | Status: DC
  Administered 2021-12-01: 1600 mg via ORAL

## 2021-11-30 MED ORDER — LOSARTAN POTASSIUM 50 MG PO TABS
100.0000 mg | ORAL_TABLET | Freq: Every evening | ORAL | Status: DC
  Administered 2021-11-30: 100 mg via ORAL
  Filled 2021-11-30 (×2): qty 2

## 2021-11-30 MED ORDER — IOHEXOL 300 MG/ML  SOLN
150.0000 mL | Freq: Once | INTRAMUSCULAR | Status: AC | PRN
  Administered 2021-11-30: 75 mL via INTRA_ARTERIAL

## 2021-11-30 MED ORDER — PROPOFOL 10 MG/ML IV BOLUS
INTRAVENOUS | Status: DC | PRN
  Administered 2021-11-30: 150 mg via INTRAVENOUS
  Administered 2021-11-30: 50 mg via INTRAVENOUS

## 2021-11-30 MED ORDER — PREGABALIN 75 MG PO CAPS
75.0000 mg | ORAL_CAPSULE | Freq: Every day | ORAL | Status: DC
  Administered 2021-12-01: 75 mg via ORAL
  Filled 2021-11-30: qty 1

## 2021-11-30 MED ORDER — HEPARIN SODIUM (PORCINE) 1000 UNIT/ML IJ SOLN
INTRAMUSCULAR | Status: DC | PRN
  Administered 2021-11-30: 2000 [IU] via INTRAVENOUS

## 2021-11-30 MED ORDER — PROTAMINE SULFATE 10 MG/ML IV SOLN
INTRAVENOUS | Status: DC | PRN
  Administered 2021-11-30: 10 mg via INTRAVENOUS

## 2021-11-30 MED ORDER — ONDANSETRON HCL 4 MG/2ML IJ SOLN: 4.0000 mg | INTRAMUSCULAR | Status: DC | PRN

## 2021-11-30 MED ORDER — HEPARIN SODIUM (PORCINE) 5000 UNIT/ML IJ SOLN
5000.0000 [IU] | Freq: Three times a day (TID) | INTRAMUSCULAR | Status: DC
  Administered 2021-11-30 – 2021-12-01 (×2): 5000 [IU] via SUBCUTANEOUS
  Filled 2021-11-30 (×2): qty 1

## 2021-11-30 MED ORDER — OXYCODONE HCL 5 MG/5ML PO SOLN
ORAL | Status: AC
  Filled 2021-11-30: qty 5

## 2021-11-30 MED ORDER — METHOCARBAMOL 750 MG PO TABS
750.0000 mg | ORAL_TABLET | Freq: Four times a day (QID) | ORAL | Status: DC
  Administered 2021-11-30 – 2021-12-01 (×2): 750 mg via ORAL
  Filled 2021-11-30 (×3): qty 1

## 2021-11-30 MED ORDER — AMLODIPINE BESYLATE 10 MG PO TABS
10.0000 mg | ORAL_TABLET | Freq: Every evening | ORAL | Status: DC
  Administered 2021-11-30: 10 mg via ORAL
  Filled 2021-11-30 (×2): qty 1

## 2021-11-30 MED ORDER — ACETAMINOPHEN 325 MG PO TABS: 650.0000 mg | ORAL_TABLET | ORAL | Status: DC

## 2021-11-30 MED ORDER — SUCCINYLCHOLINE CHLORIDE 200 MG/10ML IV SOSY
PREFILLED_SYRINGE | INTRAVENOUS | Status: AC
  Filled 2021-11-30: qty 10

## 2021-11-30 MED ORDER — ONDANSETRON HCL 4 MG/2ML IJ SOLN: 4.0000 mg | Freq: Once | INTRAMUSCULAR | Status: DC | PRN

## 2021-11-30 MED ORDER — PHENYLEPHRINE 80 MCG/ML (10ML) SYRINGE FOR IV PUSH (FOR BLOOD PRESSURE SUPPORT)
PREFILLED_SYRINGE | INTRAVENOUS | Status: AC
  Filled 2021-11-30: qty 20

## 2021-11-30 NOTE — Anesthesia Procedure Notes (Signed)
Arterial Line Insertion Start/End11/09/2021 11:42 AM, 11/30/2021 11:52 AM Performed by: Reece Agar, CRNA, CRNA  Patient location: Pre-op. Preanesthetic checklist: patient identified, IV checked, site marked, risks and benefits discussed, surgical consent, monitors and equipment checked, pre-op evaluation, timeout performed and anesthesia consent Lidocaine 1% used for infiltration Right, radial was placed Catheter size: 20 G Hand hygiene performed , maximum sterile barriers used  and Seldinger technique used Allen's test indicative of satisfactory collateral circulation Attempts: 1 Procedure performed without using ultrasound guided technique. Following insertion, dressing applied and Biopatch. Post procedure assessment: normal and unchanged  Patient tolerated the procedure well with no immediate complications.

## 2021-11-30 NOTE — Progress Notes (Signed)
The patient is admitted form PACU to 3W 18. A & O x 4 . Patient and his wife oriented to the room and staff. C/o headache and PRN pain medication administered. Will continue to monitor.

## 2021-11-30 NOTE — Progress Notes (Signed)
Patient ID: Kirk [name], male   DOB: Dec 01, 1960, 61 y.o.   MRN: [id number] [place] [facility] Progress Note   Assessment/ Plan:   1.  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  Neurosurgery have scheduled him for right middle meningeal artery embolization today 2. ESRD: Continue hemodialysis on MWF schedule  3. Anemia: Low hemoglobin and hematocrit, continue to monitor on darbepoetin for need to titrate this further. 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 5. Nutrition: Continue renal diet with ongoing supplementation as indicated. 6. Hypertension: Blood pressure elevated (likely exacerbated by headache), monitor with ongoing antihypertensive therapy and ultrafiltration with hemodialysis.  Subjective:   Patient feels well today with no complaints.  Tolerated dialysis yesterday with no issues.  Embolization today.   Objective:   BP (!) 178/69   Pulse 68   Temp 98 F (36.7 C) (Oral)   Resp 18   SpO2 96%   Physical Exam: Gen: Appears uncomfortable resting in bed CVS: Pulse regular rhythm, normal rate, S1 and S2 normal Resp: Clear to auscultation bilaterally, no distinct rales or rhonchi Abd: Soft, flat, nontender, bowel sounds normal Ext: Left upper extremity AV fistula with palpable thrill and bruit  Labs: BMET Recent Labs  Lab 11/24/21 1610  NA 130*  K 5.0  CL 93*  CO2 26  GLUCOSE 103*  BUN 39*  CREATININE 11.57*  CALCIUM 8.9  PHOS 4.3   CBC Recent Labs  Lab 11/24/21 1610 11/29/21 0618  WBC 3.5* 4.2  NEUTROABS  --  2.7  HGB 7.5* 8.0*  HCT 22.3* 23.9*  MCV 93.7 93.0  PLT 68* 81*      Medications:     acetaminophen  650 mg Oral On Call to OR   chlorhexidine  15 mL Mouth/Throat Once   Or   mouth rinse  15 mL Mouth Rinse Once     Kirk [name]  11/30/2021, 11:59 AM

## 2021-11-30 NOTE — Brief Op Note (Signed)
  NEUROSURGERY BRIEF OPERATIVE  NOTE   PREOP DX: Right subdural hematoma  POSTOP DX: Same  PROCEDURE: Onyx embolization of right middle meningeal artery  SURGEON: Dr. Consuella Lose, MD  ANESTHESIA: GETA  EBL: Minimal  SPECIMENS: None  COMPLICATIONS: None  CONDITION: Stable to recovery  FINDINGS (Full report in [facility]): 1. Successful Onyx-18 embolization of right MMA   Consuella Lose, MD [facility]

## 2021-11-30 NOTE — H&P (Signed)
NEUROSURGERY HISTORY AND PHYSICAL   Chief Complaint    Subdural hematoma   History of Present Illness  Kirk [name] is a 61 y.o. male whose history initially begins approximately 1 month ago when he presented to the emergency department with seizure.  His imaging revealed a small acute right convexity subdural hematoma.  At the time he did not require operative intervention.  Patient has subsequently presented with headache and further seizures which have been managed medically.  Repeat CT scan has demonstrated enlargement of the now subacute/chronic right frontal convexity subdural hematoma, with slightly worsened local mass effect and midline shift.  While the patient does not require emergent operative decompression, I was asked to see the patient for possibility of middle meningeal artery embolization. He has done well in CIR.   Past Medical History        Past Medical History:  Diagnosis Date   Acute edema of lung, unspecified     Acute, but ill-defined, cerebrovascular disease     Allergy     Anemia     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)     Anxiety     Asthma     Asthma      moderate persistent   Carpal tunnel syndrome     Cellulitis and abscess of trunk     Cholelithiasis 07/13/2014   Chronic headaches     Debility, unspecified     Dermatophytosis of the body     Dysrhythmia      history of   Edema     End stage renal disease on dialysis [facility])      "MWF; [facility]" (10/21/2014)   Essential hypertension, benign     GERD (gastroesophageal reflux disease)     Gout, unspecified     HTN (hypertension)     Hypertrophy of prostate without urinary obstruction and other lower urinary tract symptoms (LUTS)     Hypotension, unspecified     Impotence of organic origin     Insomnia, unspecified     Kidney replaced by transplant     Localization-related (focal) (partial) epilepsy and epileptic syndromes with complex partial seizures, without mention  of intractable epilepsy      12-15-19- Wife states he has NEVER had a seizure    Lumbago     Memory loss     OSA on CPAP     Other and unspecified hyperlipidemia      controlled /managed per wife    Other chronic nonalcoholic liver disease     Other malaise and fatigue     Other nonspecific abnormal serum enzyme levels     Pain in joint, lower leg     Pain in joint, upper arm     Pneumonia "several times"   Renal dialysis status(V45.11) 02/05/2010    restarted 01/02/13 ofter renal trransplant failure   Secondary hyperparathyroidism (of renal origin)     Shortness of breath     Sleep apnea      wears cpap    Tension headache     Unspecified constipation     Unspecified essential hypertension     Unspecified hereditary and idiopathic peripheral neuropathy     Unspecified vitamin D deficiency        Past Surgical History         Past Surgical History:  Procedure Laterality Date   AV FISTULA PLACEMENT Left ?2010    "forearm; at [place]"   [place]  CARDIAC CATHETERIZATION   03/21/2011   CHOLECYSTECTOMY N/A 10/21/2014    Procedure: LAPAROSCOPIC CHOLECYSTECTOMY WITH INTRAOPERATIVE CHOLANGIOGRAM;  Surgeon: Autumn Messing III, MD;  Location: [facility];  Service: General;  Laterality: N/A;   COLONOSCOPY       INNER EAR SURGERY Bilateral 1973    for deafness   KIDNEY TRANSPLANT   08/17/2011    [place]   10/21/2014    w/IOC   LEFT HEART CATHETERIZATION WITH CORONARY ANGIOGRAM N/A 03/21/2011    Procedure: LEFT HEART CATHETERIZATION WITH CORONARY ANGIOGRAM;  Surgeon: [name], MD;  Location: [facility] CATH LAB;  Service: Cardiovascular;  Laterality: N/A;   NEPHRECTOMY   08/2013    removed transplaned kidney   POLYPECTOMY       POSTERIOR FUSION CERVICAL SPINE   06/25/2012    for spinal stenosis   VASECTOMY   2010      Social History    Social History         Tobacco Use   Smoking status: Former      Packs/day: 0.50       Years: 32.00      Total pack years: 16.00      Types: Cigarettes      Start date: 71      Quit date: 02/24/2020      Years since quitting: 1.7   Smokeless tobacco: Never  Vaping Use   Vaping Use: Never used  Substance Use Topics   Alcohol use: No      Alcohol/week: 0.0 standard drinks of alcohol   Drug use: No      Medications           Prior to Admission medications   Medication Sig Start Date End Date Taking? Authorizing Provider  acetaminophen (TYLENOL) 500 MG tablet Take 1 tablet (500 mg total) by mouth every 6 (six) hours as needed for moderate pain. 09/12/14     Samuella Cota, MD  albuterol (PROVENTIL HFA;VENTOLIN HFA) 108 (90 Base) MCG/ACT inhaler Inhale 2 puffs into the lungs every 6 (six) hours as needed for wheezing or shortness of breath. Patient not taking: Reported on 11/07/2021 06/05/17     Patrecia Pour, MD  amLODipine (NORVASC) 10 MG tablet Take 1 tablet (10 mg total) by mouth daily. 10/19/21     Ngetich, Dinah C, NP  b complex-vitamin c-folic acid (NEPHRO-VITE) 0.8 MG TABS tablet TAKE 1 TABLET BY MOUTH EVERY DAY Patient taking differently: Take 1 tablet by mouth daily. 02/08/20     Reed, Tiffany L, DO  calcitRIOL (ROCALTROL) 0.5 MCG capsule Take 2 capsules (1 mcg total) by mouth every Monday, Wednesday, and Friday with hemodialysis. 06/04/18     Black, Lezlie Octave, NP  Cholecalciferol (VITAMIN D3) 25 MCG (1000 UT) CAPS Take 4,000 Units by mouth daily.       [provider]  cinacalcet (SENSIPAR) 30 MG tablet Take 30 mg by mouth daily. Patient not taking: Reported on 11/07/2021       [provider]  diphenhydrAMINE (BENADRYL) 25 MG tablet Take 50 mg by mouth at bedtime.       [provider]  doxazosin (CARDURA) 4 MG tablet Take 1 tablet (4 mg total) by mouth daily. 07/04/21     Ngetich, Dinah C, NP  folic acid in sodium chloride 0.9 % 50 mL Inject into the vein. And B-Complex.       [provider]  guaiFENesin (MUCINEX) 600 MG 12  hr  tablet Take 1,200 mg by mouth 2 (two) times daily.       [provider]  hydrALAZINE (APRESOLINE) 100 MG tablet TAKE 1 TABLET EVERY MORNINGAND AT BEDTIME. Patient taking differently: Take 100 mg by mouth 2 (two) times daily as needed (for systolic blood pressure greater than 140). 08/22/21     Hilty, Nadean Corwin, MD  ipratropium-albuterol (DUONEB) 0.5-2.5 (3) MG/3ML SOLN Take 3 mLs by nebulization every 4 (four) hours as needed (Shortness of breath). Patient not taking: Reported on 11/07/2021 12/05/18     Mariel Aloe, MD  isosorbide mononitrate (IMDUR) 30 MG 24 hr tablet TAKE 1 TABLET DAILY Patient taking differently: Take 30 mg by mouth daily. 11/10/19     Hilty, Nadean Corwin, MD  lidocaine-prilocaine (EMLA) cream Apply 1 application topically See admin instructions. Apply 1 to 2 hours prior to dialysis on Mondays, Wednesdays, and Fridays. Cover with saran wrap. 11/18/18     [provider]  methocarbamol (ROBAXIN) 750 MG tablet Take 1 tablet (750 mg total) by mouth in the morning, at noon, in the evening, and at bedtime. 09/12/21     Ngetich, Dinah C, NP  metoprolol tartrate (LOPRESSOR) 100 MG tablet TAKE 1 TABLET BY MOUTH TWICE A DAY Patient taking differently: Take 100 mg by mouth 2 (two) times daily. 09/21/20     Hilty, Nadean Corwin, MD  omeprazole (PRILOSEC) 20 MG capsule TAKE 1 CAPSULE TWICE DAILY BEFORE A MEAL Patient taking differently: Take 20 mg by mouth 2 (two) times daily before a meal. 02/09/21     Ngetich, Dinah C, NP  ondansetron (ZOFRAN ODT) 4 MG disintegrating tablet Take 1 tablet (4 mg total) by mouth every 8 (eight) hours as needed for nausea or vomiting. 01/11/21     Bonnielee Haff, MD  pravastatin (PRAVACHOL) 40 MG tablet TAKE 1 TABLET BY MOUTH EVERY DAY IN THE EVENING Patient taking differently: Take 40 mg by mouth daily. 10/19/21     Ngetich, Nelda Bucks, NP  PRESCRIPTION MEDICATION See admin instructions. CPAP- At bedtime and during any time of rest       [provider]  RENVELA 800 MG tablet Take 1,600-2,400 mg by mouth See admin instructions. Take 2,400 mg by mouth three times a day with meals and 1600 mg with each snack 07/06/19     [provider]  traMADol (ULTRAM) 50 MG tablet Take 1 tablet (50 mg total) by mouth daily as needed. Patient taking differently: Take 50 mg by mouth daily as needed for moderate pain. 09/12/21     Ngetich, Dinah C, NP  traZODone (DESYREL) 50 MG tablet TAKE 1/2 TO 1 TABLET BY MOUTH AT BEDTIME AS NEEDED FOR SLEEP Patient taking differently: Take 50 mg by mouth at bedtime. 10/30/21     Ngetich, Dinah C, NP  vitamin C (ASCORBIC ACID) 250 MG tablet Take 1 tablet (250 mg total) by mouth daily. 07/04/21     Ngetich, Dinah C, NP      Allergies        Allergies  Allergen Reactions   Codeine Nausea And Vomiting   Hydrocodone-Acetaminophen Nausea And Vomiting      Review of Systems  ROS   Neurologic Exam  Awake, alert, oriented Memory and concentration grossly intact Speech fluent, appropriate CN grossly intact Motor exam: Upper Extremities Deltoid Bicep Tricep Grip  Right 5/5 5/5 5/5 5/5  Left 5/5 5/5 5/5 5/5    Lower Extremities IP Quad PF DF EHL  Right  5/5 5/5 5/5 5/5 5/5  Left 5/5 5/5 5/5 5/5 5/5    Sensation grossly intact to LT   Imaging  CT and MRI scans over the past 1 month were personally reviewed.  These demonstrate slight enlargement of the right frontal convexity subdural hematoma.  Most recent CT scan revealed subacute/chronic subdural with mild local mass effect and approximately 1 mm of right to left midline shift.  There is no hydrocephalus.   Impression  - 61 y.o. male with end-stage renal disease and subacute/chronic subdural hematoma which is enlarging over the last several weeks.  I therefore think he is a good candidate for right middle meningeal artery embolization in an attempt to avoid open surgical intervention.   Plan  -We will plan on right middle meningeal artery  embolization  - Will admit to inpatient post-procedure   I have reviewed the imaging findings thus far with the patient and his family at bedside.  We have discussed treatment options for subdural hematoma including continued expectant observation, surgical evacuation, and middle meningeal artery embolization.  Given the minimally symptomatic nature of the subdural with only minor mass effect, I do think he is a candidate for MMA embolization.  I reviewed with them the details of the procedure and the expected postoperative course and recovery.  We discussed the risks of the procedure primarily being risk of stroke, arterial dissection, contrast nephropathy, and groin hematoma.  All questions today were answered and the patient provided informed consent to proceed.     Consuella Lose, MD [facility]

## 2021-11-30 NOTE — Progress Notes (Signed)
   11/30/21 0600  Vitals  Temp 98.7 F (37.1 C)  Temp Source Oral  BP (!) 149/62  BP Location Right Arm  BP Method Automatic  Patient Position (if appropriate) Lying  Pulse Rate 62  ECG Heart Rate 62  Resp 11  Post Treatment  Dialyzer Clearance Heavily streaked  Duration of HD Treatment -hour(s) 3.5 hour(s)  Liters Processed 84  Fluid Removed (mL) 2500 mL  Tolerated HD Treatment Yes  AVG/AVF Arterial Site Held (minutes) 5 minutes  AVG/AVF Venous Site Held (minutes) 5 minutes   TX finulty.

## 2021-11-30 NOTE — H&P (Deleted)
  The note originally documented on this encounter has been moved the the encounter in which it belongs.  

## 2021-11-30 NOTE — Anesthesia Procedure Notes (Signed)
Procedure Name: Intubation Date/Time: 11/30/2021 11:56 AM  Performed by: Reece Agar, CRNAPre-anesthesia Checklist: Patient identified, Emergency Drugs available, Suction available and Patient being monitored Patient Re-evaluated:Patient Re-evaluated prior to induction Oxygen Delivery Method: Circle System Utilized Preoxygenation: Pre-oxygenation with 100% oxygen Induction Type: IV induction Ventilation: Mask ventilation without difficulty, Oral airway inserted - appropriate to patient size and Two handed mask ventilation required Laryngoscope Size: Mac and 4 Grade View: Grade II Tube type: Oral Tube size: 7.5 mm Number of attempts: 1 Airway Equipment and Method: Stylet and Oral airway Placement Confirmation: ETT inserted through vocal cords under direct vision, positive ETCO2 and breath sounds checked- equal and bilateral Secured at: 22 cm Tube secured with: Tape Dental Injury: Teeth and Oropharynx as per pre-operative assessment

## 2021-11-30 NOTE — Anesthesia Preprocedure Evaluation (Deleted)
Anesthesia Evaluation    Reviewed: Allergy & Precautions, Patient's Chart, lab work & pertinent test results  History of Anesthesia Complications Negative for: history of anesthetic complications  Airway        Dental   Pulmonary asthma , sleep apnea and Continuous Positive Airway Pressure Ventilation , former smoker          Cardiovascular hypertension, Pt. on medications and Pt. on home beta blockers + dysrhythmias Atrial Fibrillation + Valvular Problems/Murmurs AS   Echo 11/16/21: EF 50-55%, no RWMA, g1dd, normal RVSF, trivial MR, mild AS (MG 8.0, AVA 1.69)   Neuro/Psych Seizures -,  Enlarging right subacute/chronic SDH H/o cervical fusion x2 with chronic extremity weakness    GI/Hepatic Neg liver ROS,GERD  ,,  Endo/Other  negative endocrine ROS    Renal/GU ESRF and DialysisRenal disease  negative genitourinary   Musculoskeletal negative musculoskeletal ROS (+)    Abdominal   Peds  Hematology  (+) Blood dyscrasia, anemia Chronic thrombocytopenia (plts 81 on 11/8)   Anesthesia Other Findings Presented with seizure and headaches, found to have acute dubdural hematoma with mild adjacent mass effect and 1-2 mm leftward midline shift. Hospital course complicated by episode of A fib with RVR. Continued enlargement of subacute/chronic SDH- planning for right MMA embolization  Reproductive/Obstetrics                             Anesthesia Physical Anesthesia Plan  ASA: 4  Anesthesia Plan: General   Post-op Pain Management: Minimal or no pain anticipated   Induction: Intravenous  PONV Risk Score and Plan: 2 and Ondansetron, Dexamethasone and Treatment may vary due to age or medical condition  Airway Management Planned: Oral ETT  Additional Equipment: Arterial line  Intra-op Plan:   Post-operative Plan: Extubation in OR  Informed Consent:   Plan Discussed with:   Anesthesia Plan  Comments:        Anesthesia Quick Evaluation

## 2021-11-30 NOTE — Plan of Care (Signed)
  Problem: RH Balance Goal: LTG Patient will maintain dynamic standing with ADLs (OT) Description: LTG:  Patient will maintain dynamic standing balance with assist during activities of daily living (OT)  Outcome: Completed/Met   Problem: RH Bathing Goal: LTG Patient will bathe all body parts with assist levels (OT) Description: LTG: Patient will bathe all body parts with assist levels (OT) Outcome: Completed/Met   Problem: RH Dressing Goal: LTG Patient will perform upper body dressing (OT) Description: LTG Patient will perform upper body dressing with assist, with/without cues (OT). Outcome: Completed/Met Goal: LTG Patient will perform lower body dressing w/assist (OT) Description: LTG: Patient will perform lower body dressing with assist, with/without cues in positioning using equipment (OT) Outcome: Completed/Met   Problem: RH Toileting Goal: LTG Patient will perform toileting task (3/3 steps) with assistance level (OT) Description: LTG: Patient will perform toileting task (3/3 steps) with assistance level (OT)  Outcome: Completed/Met   Problem: RH Toilet Transfers Goal: LTG Patient will perform toilet transfers w/assist (OT) Description: LTG: Patient will perform toilet transfers with assist, with/without cues using equipment (OT) Outcome: Completed/Met   Problem: RH Awareness Goal: LTG: Patient will demonstrate awareness during functional activites type of (OT) Description: LTG: Patient will demonstrate awareness during functional activites type of (OT) Outcome: Completed/Met

## 2021-11-30 NOTE — Progress Notes (Addendum)
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 with neurohospitalist 11/7 ok to convert 1:1 po  Increased levetiracetam to '750mg'$  per day and extra '250mg'$  on HD days, recheck level Monday  -11/7 no further sz episode, on IV Keppra '750mg'$  daily with extra '250mg'$  on HD days -CT head in am 11.  PAF with RVR.  Not a candidate for anticoagulation.  Lopressor 100 mg twice daily 12.  Hypertension.  Cardura 4 mg daily, Cozaar 100 mg daily, hydralazine 100 mg every 8 hours, Imdur 60 mg daily.  Monitor with increased mobility Vitals:   11/30/21 0600 11/30/21 0653  BP: (!) 149/62 (!) 166/65  Pulse: 62 67  Resp: 11 18  Temp: 98.7 F (37.1 C)   SpO2: 98% 97%   Overall improved appreciate nephro help, HD MWF  13.  Hyperlipidemia.  Pravachol 14.  Anemia of chronic disease.  Follow-up CBC -HGB improved to 8.0 15.  GERD.  Protonix/Carafate 16.  Chronic diastolic congestive heart failure.  Monitor for any signs of fluid overload Monitor daily weights Filed Weights   11/24/21 1500 11/24/21 1944 11/27/21 1607  Weight: 96.7 kg 92.6 kg 97.4 kg    17.  Obesity.  BMI 32.49.  Dietary  follow-up 18.  OSA.  CPAP 19.  CHronic hearing issues but not severe , likely not Alport syndrome , denies having cochlear implant , none seen of CT head  20.  Systolic heart murmur with ECHO demonstrating aortic stenosis. 21.  THrombocytopenia-  plt count stable    Latest Ref Rng & Units 11/29/2021    6:18 AM 11/24/2021    4:10 PM 11/21/2021    3:21 AM  CBC  WBC 4.0 - 10.5 K/uL 4.2  3.5  4.7   Hemoglobin 13.0 - 17.0 g/dL 8.0  7.5  8.3   Hematocrit 39.0 - 52.0 % 23.9  22.3  26.0   Platelets 150 - 400 K/uL 81  68  101       LOS: 9 days A FACE TO FACE EVALUATION WAS PERFORMED  YJennye Boroughs11/09/2021, 5:19 PM

## 2021-11-30 NOTE — Progress Notes (Signed)
Patient ID: Kirk [name], male   DOB: [birth year], 61 y.o.   MRN: [id number]  SW called patient spouse and informed spouse that patient will discharge home after surgery vs returning to CIR. Patient and spouse in agreement no additional questions or concerns.

## 2021-11-30 NOTE — Transfer of Care (Signed)
Immediate Anesthesia Transfer of Care Note  Patient: Kirk [name]  Procedure(s) Performed: RIGHT MIDDLE MENINGEAL ARTERY EMBOLIZATION  Patient Location: PACU  Anesthesia Type:General  Level of Consciousness: awake and alert   Airway & Oxygen Therapy: Patient Spontanous Breathing and Patient connected to face mask oxygen  Post-op Assessment: Report given to RN, Post -op Vital signs reviewed and stable, and Patient moving all extremities X 4  Post vital signs: Reviewed and stable  Last Vitals:  Vitals Value Taken Time  BP 134/52 11/30/21 1509  Temp    Pulse 74 11/30/21 1513  Resp 14 11/30/21 1513  SpO2 95 % 11/30/21 1513  Vitals shown include unvalidated device data.  Last Pain:  Vitals:   11/30/21 1154  TempSrc: Oral  PainSc: 6       Patients Stated Pain Goal: 3 (14/44/58 4835)  Complications: No notable events documented.

## 2021-11-30 NOTE — Progress Notes (Signed)
Occupational Therapy Discharge Summary  Patient Details  Name: Kirk [name] MRN: [id number] Date of Birth: July 26, 1960  Date of Discharge from OT service:November 30, 2021     Patient has met 7 of 7 long term goals due to improved activity tolerance, improved balance, postural control, ability to compensate for deficits, and improved coordination.  Patient to discharge at overall Supervision level.  Patient's care partner is independent to provide the necessary physical and cognitive assistance at discharge.  No family education with spouse as pt had to leave for medical procedure. Spouse can have fam ed for stair training with acute care therapies.   Reasons goals not met: n/a  Recommendation:  Patient will benefit from ongoing skilled OT services in home health setting to continue to advance functional skills in the area of BADL.  Equipment: No equipment provided  Reasons for discharge: treatment goals met and , discharge from rehab for medical procedure  Patient/family agrees with progress made and goals achieved: Yes  OT Discharge Precautions/Restrictions  Precautions Precautions: Fall Precaution Comments: watch vitals, B knee buckling,seizures Restrictions Weight Bearing Restrictions: No General   Vital Signs Therapy Vitals Pulse Rate: 67 Resp: 18 BP: (Abnormal) 166/65 Patient Position (if appropriate): Sitting Oxygen Therapy SpO2: 97 % O2 Device: Room Air    ADL ADL ADL Comments: supervision ovarall due to fluctuating physical and cognitive status. At times, pt is very stable and able to balance well and other times his knees buckle and pt is not as focused. Vision Baseline Vision/History: 1 Wears glasses Patient Visual Report: No change from baseline Eye Alignment: Within Functional Limits Ocular Range of Motion: Within Functional Limits Tracking/Visual Pursuits: Decreased smoothness of eye movement to LEFT inferior field Convergence: Impaired  (comment) Visual Fields: Right visual field deficit Perception  Perception: Within Functional Limits Praxis Praxis: Intact Cognition Cognition Overall Cognitive Status: Impaired/Different from baseline Arousal/Alertness: Awake/alert Orientation Level: Person;Place;Situation Person: Oriented Place: Oriented Situation: Oriented Memory: Impaired Memory Impairment: Storage deficit;Decreased long term memory;Decreased short term memory;Decreased recall of new information Awareness: Impaired Awareness Impairment: Intellectual impairment;Other (comment);Anticipatory impairment Problem Solving: Impaired Problem Solving Impairment: Verbal complex;Functional complex Safety/Judgment: Impaired Comments: impuslive with poor awareness of balance deficits Brief Interview for Mental Status (BIMS) Repetition of Three Words (First Attempt): 3 Temporal Orientation: Year: Correct Temporal Orientation: Month: Accurate within 5 days Temporal Orientation: Day: Correct Recall: "Sock": Yes, no cue required Recall: "Blue": Yes, no cue required Recall: "Bed": Yes, no cue required BIMS Summary Score: 15 Sensation Sensation Light Touch: Appears Intact Hot/Cold: Appears Intact Proprioception: Appears Intact Stereognosis: Impaired by gross assessment Coordination Gross Motor Movements are Fluid and Coordinated: No Fine Motor Movements are Fluid and Coordinated: No Coordination and Movement Description: ataxia due to limited LE motor control Finger Nose Finger Test: functional Motor  Motor Motor: Ataxia Mobility  Transfers Sit to Stand: Supervision/Verbal cueing  Trunk/Postural Assessment  Postural Control Postural Control: Deficits on evaluation  Balance Static Sitting Balance Static Sitting - Level of Assistance: 7: Independent Dynamic Sitting Balance Dynamic Sitting - Level of Assistance: 7: Independent Static Standing Balance Static Standing - Level of Assistance: 5: Stand by  assistance Dynamic Standing Balance Dynamic Standing - Level of Assistance: 5: Stand by assistance Extremity/Trunk Assessment RUE Assessment RUE Assessment: Within Functional Limits Active Range of Motion (AROM) Comments: pt able to lift arms overhead at discharge LUE Assessment LUE Assessment: Within Functional Limits Active Range of Motion (AROM) Comments: pt able to lift arms overhead at discharge   [place] [address] [place] 11/30/2021, 10:08 AM

## 2021-11-30 NOTE — Anesthesia Preprocedure Evaluation (Signed)
Anesthesia Evaluation  Patient identified by MRN, date of birth, ID band Patient awake    Reviewed: Allergy & Precautions, NPO status , Patient's Chart, lab work & pertinent test results  History of Anesthesia Complications Negative for: history of anesthetic complications  Airway Mallampati: II  TM Distance: >3 FB Neck ROM: Full    Dental  (+) Edentulous Upper, Edentulous Lower, Dental Advisory Given   Pulmonary asthma , sleep apnea and Continuous Positive Airway Pressure Ventilation , former smoker   Pulmonary exam normal        Cardiovascular hypertension, Pt. on medications and Pt. on home beta blockers Normal cardiovascular exam+ dysrhythmias Atrial Fibrillation + Valvular Problems/Murmurs AS   Echo 11/16/21: EF 50-55%, no RWMA, g1dd, normal RVSF, trivial MR, mild AS (MG 8.0, AVA 1.69)   Neuro/Psych Seizures -,  Enlarging right subacute/chronic SDH H/o cervical fusion x2 with chronic extremity weakness    GI/Hepatic Neg liver ROS,GERD  ,,  Endo/Other  negative endocrine ROS    Renal/GU ESRF and DialysisRenal disease  negative genitourinary   Musculoskeletal negative musculoskeletal ROS (+)    Abdominal   Peds  Hematology  (+) Blood dyscrasia, anemia Chronic thrombocytopenia (plts 81 on 11/8)   Anesthesia Other Findings Presented with seizure and headaches, found to have acute dubdural hematoma with mild adjacent mass effect and 1-2 mm leftward midline shift. Hospital course complicated by episode of A fib with RVR. Continued enlargement of subacute/chronic SDH- planning for right MMA embolization  Reproductive/Obstetrics                             Anesthesia Physical Anesthesia Plan  ASA: 4  Anesthesia Plan: General   Post-op Pain Management: Minimal or no pain anticipated and Tylenol PO (pre-op)*   Induction: Intravenous  PONV Risk Score and Plan: 2 and Ondansetron,  Dexamethasone and Treatment may vary due to age or medical condition  Airway Management Planned: Oral ETT  Additional Equipment: Arterial line  Intra-op Plan:   Post-operative Plan: Extubation in OR  Informed Consent: I have reviewed the patients History and Physical, chart, labs and discussed the procedure including the risks, benefits and alternatives for the proposed anesthesia with the patient or authorized representative who has indicated his/her understanding and acceptance.     Dental advisory given  Plan Discussed with:   Anesthesia Plan Comments:        Anesthesia Quick Evaluation

## 2021-12-01 ENCOUNTER — Telehealth: Payer: Self-pay

## 2021-12-01 ENCOUNTER — Encounter (HOSPITAL_COMMUNITY): Payer: Self-pay | Admitting: Radiology

## 2021-12-01 LAB — HEMOGLOBIN AND HEMATOCRIT, BLOOD
HCT: 24.8 % — ABNORMAL LOW (ref 39.0–52.0)
Hemoglobin: 8.2 g/dL — ABNORMAL LOW (ref 13.0–17.0)

## 2021-12-01 LAB — RENAL FUNCTION PANEL
Albumin: 3.1 g/dL — ABNORMAL LOW (ref 3.5–5.0)
Anion gap: 11 (ref 5–15)
BUN: 36 mg/dL — ABNORMAL HIGH (ref 6–20)
CO2: 26 mmol/L (ref 22–32)
Calcium: 8.8 mg/dL — ABNORMAL LOW (ref 8.9–10.3)
Chloride: 97 mmol/L — ABNORMAL LOW (ref 98–111)
Creatinine, Ser: 10.55 mg/dL — ABNORMAL HIGH (ref 0.61–1.24)
GFR, Estimated: 5 mL/min — ABNORMAL LOW (ref >60)
Glucose, Bld: 119 mg/dL — ABNORMAL HIGH (ref 70–99)
Phosphorus: 3.5 mg/dL (ref 2.5–4.6)
Potassium: 4.4 mmol/L (ref 3.5–5.1)
Sodium: 134 mmol/L — ABNORMAL LOW (ref 135–145)

## 2021-12-01 LAB — CBC
HCT: 20.5 % — ABNORMAL LOW (ref 39.0–52.0)
Hemoglobin: 6.7 g/dL — CL (ref 13.0–17.0)
MCH: 30.9 pg (ref 26.0–34.0)
MCHC: 32.7 g/dL (ref 30.0–36.0)
MCV: 94.5 fL (ref 80.0–100.0)
Platelets: 74 10*3/uL — ABNORMAL LOW (ref 150–400)
RBC: 2.17 MIL/uL — ABNORMAL LOW (ref 4.22–5.81)
RDW: 14.2 % (ref 11.5–15.5)
WBC: 3.8 10*3/uL — ABNORMAL LOW (ref 4.0–10.5)
nRBC: 0 % (ref 0.0–0.2)

## 2021-12-01 LAB — PREPARE RBC (CROSSMATCH)

## 2021-12-01 MED ORDER — SODIUM CHLORIDE 0.9% IV SOLUTION: Freq: Once | INTRAVENOUS | Status: DC

## 2021-12-01 NOTE — Progress Notes (Signed)
Lab called with critical hgb of 6.7. Notified provider of lab new orders will be given for transfusion.

## 2021-12-01 NOTE — Progress Notes (Signed)
Received patient in bed to unit.  Alert and oriented.  Informed consent signed and in chart.   Treatment initiated: 2536 Treatment completed: 1249  Patient tolerated well. Pt received 1 unit of Prbc's, no s/s of adverse reaction. Transported back to the room  Alert, without acute distress.  Hand-off given to patient's nurse.   Access used: AVF Access issues: None  Total UF removed: 2L Medication(s) given: none Post HD VS: 156/65,65,17,97%98.0 Post HD weight: 96kg   [facility]

## 2021-12-01 NOTE — Telephone Encounter (Signed)
SW spoke with pt wife to discuss DME needs: hurrycane and 3in1 BSC. SW informed will need to private purchase Hurrycane, and can order SPC. SW shared often times [place] does not carry [facility] and you will have to purchase. She was in agreement with purchasing if not available.   SW ordered DME with [employer] via parachute.  *Unable to get 3in1 BSC as received on 10/20 before coming to CIR. SPC will be delivered to room. SW called pt wife to discuss about item. She stated she was not aware one was ordered prior to him leaving.  Discussed concerns with 3W CM, and RN. Pt provided a 3in1 BSC and given to pt. SW updated pt wife on above.   Case closed to SW.   Loralee Pacas, MSW, [place] Office: [phone] Cell: [phone] Fax: [phone]

## 2021-12-01 NOTE — Progress Notes (Signed)
   12/01/21 1400  Clinical Encounter Type  Visited With Patient  Visit Type Initial;Spiritual support  Referral From Nurse  Consult/Referral To Chaplain   Chaplain responded to a spiritual consult for advanced directive education. After introducing the forms to the patient, Kirk [name], he declined stating that he had everything in order already. As I departed I wished a peaceful day.   Danice Goltz [facility]  [phone]

## 2021-12-01 NOTE — Progress Notes (Signed)
  NEUROSURGERY PROGRESS NOTE   Pt had some issues with CPAP overnight, put on tele. Remained neurologically and hemodynamically stable.  EXAM:  BP (!) 126/46 (BP Location: Right Arm)   Pulse 74   Temp 99 F (37.2 C) (Oral)   Resp 16   SpO2 96%   Awake, alert, oriented  Speech fluent, appropriate  CN grossly intact  5/5 BUE/BLE   IMPRESSION:  61 y.o. male POD#1 right MMA embolization for chronic SDH, at baseline. Spoke with PM&R MD prior to admission yesterday, plan on d/c home today rather than re-admit to CIR.  PLAN: - HD this am - D/C home later today   Consuella Lose, MD [facility]

## 2021-12-01 NOTE — TOC Transition Note (Signed)
Transition of Care [facility]) - CM/SW Discharge Note   Patient Details  Name: Kirk [name] MRN: [id number] Date of Birth: 1960/04/23  [facility] of [facility]) CM/SW Contact:  Pollie Friar, RN Phone Number: 12/01/2021, 2:18 PM   Clinical Narrative:    Pt is from home with his spouse. Pt states she can provide needed supervision.  MD ordered [facility] therapies for home but pt doesn't want therapy coming to the home due to his dogs. He asked to attend outpatient therapy. MD in agreement. Outpatient therapy arranged through [place]. Information on the AVS.  Pt states his spouse will provide transport home.    Final next level of care: OP Rehab Barriers to Discharge: No Barriers Identified   Patient Goals and CMS Choice     Choice offered to / list presented to : Patient  Discharge Placement                       Discharge Plan and Services                                     Social Determinants of Health (SDOH) Interventions Housing Interventions: Intervention Not Indicated   Readmission Risk Interventions     No data to display

## 2021-12-01 NOTE — Progress Notes (Signed)
Patient received all his equipments at this time. AVS reviewed with patient. All questions answered at this time. Awaiting for transportation from spouse to disposition.

## 2021-12-01 NOTE — Discharge Summary (Signed)
Physician Discharge Summary  Patient ID: Kirk [name] MRN: [id number] DOB/AGE: 1960/01/24 61 y.o.  Admit date: 11/30/2021 Discharge date: 12/01/2021  Admission Diagnoses:  Subdural hematoma  Discharge Diagnoses:  Same Principal Problem:   Cerebral aneurysm Active Problems:   Subdural hematoma [facility])   Discharged Condition: Stable  Hospital Course:  Kirk [name] is a 61 y.o. male admitted from CIR after initially presenting with SDH a few weeks ago. He underwent uncomplicated right MMA embolization and was at baseline postop. Discussed with rehab MD and pt was stable for d/c home rather than re-admission to CIR.  Treatments: Surgery - right MMA embolization  Discharge Exam: Blood pressure (!) 126/46, pulse 74, temperature 99 F (37.2 C), temperature source Oral, resp. rate 16, SpO2 96 %. Awake, alert, oriented Speech fluent, appropriate CN grossly intact 5/5 BUE/BLE Wound c/d/i  Disposition: Discharge disposition: 01-Home or Self Care       Discharge Instructions     Call MD for:  redness, tenderness, or signs of infection (pain, swelling, redness, odor or green/yellow discharge around incision site)   Complete by: As directed    Call MD for:  temperature >100.4   Complete by: As directed    Diet - low sodium heart healthy   Complete by: As directed    Discharge instructions   Complete by: As directed    Walk at home as much as possible, at least 4 times / day   Increase activity slowly   Complete by: As directed    Lifting restrictions   Complete by: As directed    No lifting > 10 lbs   May shower / Bathe   Complete by: As directed    48 hours after surgery   May walk up steps   Complete by: As directed    No wound care   Complete by: As directed    Other Restrictions   Complete by: As directed    No bending/twisting at waist      Allergies as of 12/01/2021       Reactions   Codeine Nausea And Vomiting   Hydrocodone-acetaminophen Nausea  And Vomiting        Medication List     TAKE these medications    acetaminophen 500 MG tablet Commonly known as: TYLENOL Take 1 tablet (500 mg total) by mouth every 6 (six) hours as needed for moderate pain.   amLODipine 10 MG tablet Commonly known as: NORVASC Take 1 tablet (10 mg total) by mouth every evening.   calcitRIOL 0.5 MCG capsule Commonly known as: ROCALTROL Take 2 capsules (1 mcg total) by mouth every Monday, Wednesday, and Friday with hemodialysis.   cinacalcet 30 MG tablet Commonly known as: SENSIPAR Take 2 tablets (60 mg total) by mouth 3 (three) times a week. What changed: when to take this   cloNIDine 0.2 MG tablet Commonly known as: CATAPRES Take 1 tablet (0.2 mg total) by mouth every 4 (four) hours as needed (Sbp >170 or dbp>100).   Darbepoetin Alfa 60 MCG/0.3ML Sosy injection Commonly known as: ARANESP Inject 0.3 mLs (60 mcg total) into the skin every Saturday at 6 PM. Start taking on: December 02, 2021   doxazosin 4 MG tablet Commonly known as: CARDURA Take 1 tablet (4 mg total) by mouth daily.   guaiFENesin 600 MG 12 hr tablet Commonly known as: MUCINEX Take 1,200 mg by mouth 2 (two) times daily.   hydrALAZINE 100 MG tablet Commonly known as: APRESOLINE Take 1 tablet (100  mg total) by mouth every 8 (eight) hours. What changed:  when to take this reasons to take this   isosorbide mononitrate 60 MG 24 hr tablet Commonly known as: IMDUR Take 1 tablet (60 mg total) by mouth daily.   levETIRAcetam 750 MG tablet Commonly known as: KEPPRA Take 1 tablet (750 mg total) by mouth daily.   levETIRAcetam 250 MG tablet Commonly known as: KEPPRA Take 1 tablet (250 mg total) by mouth every Monday, Wednesday, and Friday with hemodialysis.   losartan 100 MG tablet Commonly known as: COZAAR Take 1 tablet (100 mg total) by mouth every evening.   methocarbamol 750 MG tablet Commonly known as: ROBAXIN Take 1 tablet (750 mg total) by mouth 4 (four)  times daily. What changed:  when to take this reasons to take this   metoprolol tartrate 100 MG tablet Commonly known as: LOPRESSOR TAKE 1 TABLET BY MOUTH TWICE A DAY   multivitamin Tabs tablet Take 1 tablet by mouth daily.   nitroGLYCERIN 0.4 MG SL tablet Commonly known as: NITROSTAT Place 1 tablet (0.4 mg total) under the tongue every 5 (five) minutes as needed for chest pain.   oxyCODONE 5 MG immediate release tablet Commonly known as: Oxy IR/ROXICODONE Take 1-2 tablets (5-10 mg total) by mouth every 4 (four) hours as needed for moderate pain.   pantoprazole 40 MG tablet Commonly known as: PROTONIX Take 1 tablet (40 mg total) by mouth 2 (two) times daily before a meal.   pravastatin 40 MG tablet Commonly known as: PRAVACHOL TAKE 1 TABLET BY MOUTH EVERY DAY IN THE EVENING What changed:  how much to take how to take this when to take this additional instructions   pregabalin 75 MG capsule Commonly known as: LYRICA Take 1 capsule (75 mg total) by mouth daily.   senna-docusate 8.6-50 MG tablet Commonly known as: Senokot-S Take 1 tablet by mouth 2 (two) times daily.   sevelamer carbonate 800 MG tablet Commonly known as: Renvela Take 2 tablets (1,600 mg total) by mouth as needed (snacks). What changed: when to take this   sevelamer carbonate 800 MG tablet Commonly known as: Renvela Take 4 tablets (3,200 mg total) by mouth 3 (three) times daily with meals. What changed: Another medication with the same name was changed. Make sure you understand how and when to take each.   sucralfate 1 GM/10ML suspension Commonly known as: CARAFATE Take 10 mLs (1 g total) by mouth 4 (four) times daily -  with meals and at bedtime.   traZODone 50 MG tablet Commonly known as: DESYREL Take 1 tablet (50 mg total) by mouth at bedtime.   Vitamin D3 25 MCG (1000 UT) Caps Take 4,000 Units by mouth daily.        Follow-up Information     Dawley, Troy C, DO Follow up.   Contact  information: [address] [place] [address] [phone]                 Signed: Jairo Ben 12/01/2021, 8:33 AM

## 2021-12-01 NOTE — Progress Notes (Signed)
D/C order noted and chart reviewed. Contacted [facility] to advise clinic of pt's d/c today to home and that pt will resume care on Monday.   Melven Sartorius Renal Navigator [phone]

## 2021-12-01 NOTE — Plan of Care (Signed)
  Problem: RH Balance Goal: LTG Patient will maintain dynamic sitting balance (PT) Description: LTG:  Patient will maintain dynamic sitting balance with assistance during mobility activities (PT) Outcome: Completed/Met Goal: LTG Patient will maintain dynamic standing balance (PT) Description: LTG:  Patient will maintain dynamic standing balance with assistance during mobility activities (PT) Outcome: Completed/Met   Problem: RH Bed Mobility Goal: LTG Patient will perform bed mobility with assist (PT) Description: LTG: Patient will perform bed mobility with assistance, with/without cues (PT). Outcome: Completed/Met   Problem: RH Bed to Chair Transfers Goal: LTG Patient will perform bed/chair transfers w/assist (PT) Description: LTG: Patient will perform bed to chair transfers with assistance (PT). Outcome: Completed/Met   Problem: RH Car Transfers Goal: LTG Patient will perform car transfers with assist (PT) Description: LTG: Patient will perform car transfers with assistance (PT). Outcome: Completed/Met   Problem: RH Ambulation Goal: LTG Patient will ambulate in controlled environment (PT) Description: LTG: Patient will ambulate in a controlled environment, # of feet with assistance (PT). Outcome: Completed/Met Goal: LTG Patient will ambulate in home environment (PT) Description: LTG: Patient will ambulate in home environment, # of feet with assistance (PT). Outcome: Completed/Met   Problem: RH Wheelchair Mobility Goal: LTG Patient will propel w/c in controlled environment (PT) Description: LTG: Patient will propel wheelchair in controlled environment, # of feet with assist (PT) Outcome: Completed/Met   Problem: RH Stairs Goal: LTG Patient will ambulate up and down stairs w/assist (PT) Description: LTG: Patient will ambulate up and down # of stairs with assistance (PT) Outcome: Completed/Met

## 2021-12-01 NOTE — Plan of Care (Signed)
  Problem: Clinical Measurements: Goal: Diagnostic test results will improve Outcome: Adequate for Discharge Goal: Signs and symptoms of infection will decrease Outcome: Adequate for Discharge   Problem: Fluid Volume: Goal: Hemodynamic stability will improve Outcome: Adequate for Discharge   Problem: Respiratory: Goal: Ability to maintain adequate ventilation will improve Outcome: Adequate for Discharge   Problem: Education: Goal: Individualized Educational Video(s) Outcome: Adequate for Discharge   Problem: Cardiovascular: Goal: Ability to achieve and maintain adequate cardiovascular perfusion will improve Outcome: Adequate for Discharge Goal: Vascular access site(s) Level 0-1 will be maintained Outcome: Adequate for Discharge

## 2021-12-01 NOTE — Progress Notes (Addendum)
Patient ID: Kirk [name], male   DOB: Nov 23, 1960, 61 y.o.   MRN: [id number]  [facility] orders sent to [place] [place] 11/10. Patient anticipating discharge from procedure today.  Patient approved 11/10  Orders sent in EPIC by CSW on pt's unit

## 2021-12-01 NOTE — Anesthesia Postprocedure Evaluation (Signed)
Anesthesia Post Note  Patient: Kirk [name]  Procedure(s) Performed: RIGHT MIDDLE MENINGEAL ARTERY EMBOLIZATION     Patient location during evaluation: PACU Anesthesia Type: General Level of consciousness: awake and alert Pain management: pain level controlled Vital Signs Assessment: post-procedure vital signs reviewed and stable Respiratory status: spontaneous breathing, nonlabored ventilation and respiratory function stable Cardiovascular status: blood pressure returned to baseline and stable Postop Assessment: no apparent nausea or vomiting Anesthetic complications: no   No notable events documented.  Last Vitals:  Vitals:   12/01/21 1200 12/01/21 1230  BP: (!) 145/68 (!) 151/63  Pulse: (!) 59 62  Resp: 14 15  Temp:    SpO2: 100% 100%    Last Pain:  Vitals:   12/01/21 1137  TempSrc: Oral  PainSc:                  Lidia Collum

## 2021-12-01 NOTE — Progress Notes (Addendum)
The patient woke ripped his CPAP off and called the nurses station sated he stopped breathing. This writer went to the room and patient repeated " I stopped breathing a couple of times." Will call MD and ask for telemetry monitoring. Patient refused to put back  his CPAP and demanded to see the MD to figure out what's going on with him. Paged Dr. Kathyrn Sheriff and awaiting for a call back. Patient is on a phone right now talking to his wife. He answered all the orientation questions appropriately.

## 2021-12-01 NOTE — Progress Notes (Signed)
Physical Therapy Discharge Summary  Patient Details  Name: Kirk [name] MRN: [id number] Date of Birth: 04-22-1960  Date of Discharge from PT service:November 30, 2021  Today's Date: 12/01/2021 PT Individual Time:  -       Patient has met 9 of 9 long term goals due to improved activity tolerance, improved balance, improved postural control, ability to compensate for deficits, improved attention, and improved awareness.  Patient to discharge at an ambulatory level Supervision.   Patient's care partner is independent to provide the necessary physical and cognitive assistance at discharge.  Reasons goals not met: All PT goals met. Pt does demonstrate fluctuation in mobility depending on severity of HA with occasional min assist for balance due to knee instability   Recommendation:  Patient will benefit from ongoing skilled PT services in home health setting to continue to advance safe functional mobility, address ongoing impairments in balance endurance, tranfers, strength, attention, safety, and minimize fall risk.  Equipment: No equipment provided  Reasons for discharge: treatment goals met and discharge from hospital  Patient/family agrees with progress made and goals achieved: Yes  PT Discharge Precautions/Restrictions Precautions Precautions: Fall Precaution Comments: watch vitals, B knee buckling,seizures Restrictions Weight Bearing Restrictions: No  Pain Interference Pain Interference Pain Effect on Sleep: 4. Almost constantly Pain Interference with Therapy Activities: 3. Frequently Pain Interference with Day-to-Day Activities: 3. Frequently Vision/Perception  Vision - History Ability to See in Adequate Light: 0 Adequate Vision - Assessment Eye Alignment: Within Functional Limits Ocular Range of Motion: Within Functional Limits Tracking/Visual Pursuits: Decreased smoothness of eye movement to LEFT inferior field Saccades: Other (comment) Convergence: Impaired  (comment) Diplopia Assessment: Present in near gaze Perception Perception: Within Functional Limits Praxis Praxis: Intact  Cognition Orientation Level: Oriented X4 Awareness: Impaired Awareness Impairment: Intellectual impairment;Other (comment);Anticipatory impairment Problem Solving: Impaired Safety/Judgment: Impaired Comments: impuslive with poor awareness of balance deficits Sensation Sensation Light Touch: Appears Intact Hot/Cold: Appears Intact Proprioception: Appears Intact Stereognosis: Impaired by gross assessment Coordination Gross Motor Movements are Fluid and Coordinated: No Fine Motor Movements are Fluid and Coordinated: No Coordination and Movement Description: ataxia due to limited LE motor control Finger Nose Finger Test: functional Motor  Motor Motor: Ataxia  Mobility Bed Mobility Bed Mobility: Rolling Right;Rolling Left;Supine to Sit;Sit to Supine Rolling Right: Independent with assistive device Rolling Left: Independent with assistive device Supine to Sit: Independent with assistive device Sit to Supine: Independent with assistive device Transfers Transfers: Sit to Stand;Stand Pivot Transfers Sit to Stand: Independent with assistive device Stand Pivot Transfers: Supervision/Verbal cueing Locomotion  Gait Ambulation: Yes Gait Assistance: Supervision/Verbal cueing Gait Distance (Feet): 200 Feet Assistive device: Rolling walker Gait Gait: Yes Gait Pattern: Impaired Gait Pattern: Step-through pattern;Lateral hip instability;Ataxic;Decreased step length - left;Decreased stride length Stairs / Additional Locomotion Stairs: Yes Stair Management Technique: One rail Right Number of Stairs: 12 Height of Stairs: 6 Ramp: Supervision/Verbal cueing Wheelchair Mobility Wheelchair Mobility: Yes Wheelchair Assistance: Chartered loss adjuster: Both upper extremities Wheelchair Parts Management: Supervision/cueing  Trunk/Postural  Assessment  Cervical Assessment Cervical Assessment: Within Psychologist, forensic Assessment Thoracic Assessment: Exceptions to [facility] Lumbar Assessment Lumbar Assessment: Exceptions to [facility] Postural Control Postural Control: Deficits on evaluation  Balance Balance Balance Assessed: Yes Static Sitting Balance Static Sitting - Level of Assistance: 6: Modified independent (Device/Increase time) Dynamic Sitting Balance Dynamic Sitting - Level of Assistance: 6: Modified independent (Device/Increase time) Static Standing Balance Static Standing - Level of Assistance: 5: Stand by assistance Dynamic Standing Balance Dynamic Standing - Balance Support:  Bilateral upper extremity supported Dynamic Standing - Level of Assistance: 5: Stand by assistance Extremity Assessment      RLE Assessment RLE Assessment: Exceptions to [facility] RLE Strength Right Hip Flexion: 4-/5 Right Hip Extension: 4-/5 Right Hip ABduction: 4/5 Right Hip ADduction: 4/5 Right Knee Flexion: 4-/5 Right Knee Extension: 4-/5 Right Ankle Dorsiflexion: 4/5 LLE Assessment LLE Assessment: Exceptions to [facility] LLE Strength Left Hip Flexion: 4-/5 Left Hip Extension: 4-/5 Left Hip ABduction: 4/5 Left Hip ADduction: 4/5 Left Knee Flexion: 4/5 Left Knee Extension: 4/5 Left Ankle Dorsiflexion: 4/5   Lorie Phenix 12/01/2021, 9:01 AM

## 2021-12-01 NOTE — Progress Notes (Signed)
Patient verbalizes that he left his equipments consist of a walker and a BSC in rehab [phone]. RN spoke to rehab staffs and will attempt to get pt's equipments prior to leaving.

## 2021-12-01 NOTE — Progress Notes (Signed)
Patient ID: Kirk [name], male   DOB: 19-Dec-1960, 61 y.o.   MRN: [id number] [facility] Progress Note   Assessment/ Plan:   1.  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  Status postembolization with neurosurgery 2. ESRD: Continue hemodialysis on MWF schedule  3. Anemia: Low hemoglobin and hematocrit, continue to monitor on darbepoetin for need to titrate this further.  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 5. Nutrition: Continue renal diet with ongoing supplementation as indicated. 6. Hypertension: Blood pressure overall improved  Subjective:   Patient underwent successful embolization yesterday.  Tolerating dialysis today with no significant issues.  Hemoglobin noted to be less than 7 and will receive a transfusion today.  Patient says he may be discharged today   Objective:   BP (!) 149/63   Pulse 65   Temp 98.9 F (37.2 C) (Oral)   Resp 15   Wt 94.8 kg   SpO2 98%   BMI 32.73 kg/m   Physical Exam: Gen: Appears uncomfortable resting in bed CVS: Pulse regular rhythm, normal rate, S1 and S2 normal Resp: No increased work of breathing, no distinct rales or rhonchi Abd: Soft, flat, nontender, bowel sounds normal Ext: Left upper extremity AV fistula with palpable thrill and bruit  Labs: BMET Recent Labs  Lab 11/24/21 1610 11/30/21 2116 12/01/21 0839  NA 130*  --  134*  K 5.0  --  4.4  CL 93*  --  97*  CO2 26  --  26  GLUCOSE 103*  --  119*  BUN 39*  --  36*  CREATININE 11.57* 9.55* 10.55*  CALCIUM 8.9  --  8.8*  PHOS 4.3  --  3.5   CBC Recent Labs  Lab 11/24/21 1610 11/29/21 0618 11/30/21 2116 12/01/21 0839  WBC 3.5* 4.2 3.5* 3.8*  NEUTROABS  --  2.7  --   --   HGB 7.5* 8.0* 7.2* 6.7*  HCT 22.3* 23.9* 22.4* 20.5*  MCV 93.7 93.0 95.7 94.5  PLT 68* 81* 73* 74*      Medications:     sodium chloride    Intravenous Once   amLODipine  10 mg Oral QPM   calcitRIOL  1 mcg Oral Q M,W,F-HD   Chlorhexidine Gluconate Cloth  6 each Topical Q0600   cholecalciferol  4,000 Units Oral Daily   cinacalcet  60 mg Oral Once per day on Mon Wed Fri   [START ON 12/02/2021] Darbepoetin Alfa  60 mcg Subcutaneous Q Sat-1800   doxazosin  4 mg Oral Daily   guaiFENesin  1,200 mg Oral BID   heparin injection (subcutaneous)  5,000 Units Subcutaneous Q8H   hydrALAZINE  100 mg Oral Q8H   isosorbide mononitrate  60 mg Oral Daily   levETIRAcetam  250 mg Oral Q M,W,F-HD   levETIRAcetam  750 mg Oral Daily   losartan  100 mg Oral QPM   methocarbamol  750 mg Oral QID   metoprolol tartrate  100 mg Oral BID   multivitamin  1 tablet Oral Daily   pantoprazole  40 mg Oral BID AC   pravastatin  40 mg Oral Daily   pregabalin  75 mg Oral Daily   senna-docusate  1 tablet Oral BID   sevelamer carbonate  1,600 mg Oral With snacks   sevelamer carbonate  3,200 mg Oral TID WC   traZODone  50 mg Oral QHS     Shaune Pollack  Margret Moat  12/01/2021, 9:39 AM

## 2021-12-01 NOTE — Plan of Care (Signed)
  Problem: RH Awareness Goal: LTG: Patient will demonstrate awareness during functional activites type of (SLP) Description: LTG: Patient will demonstrate awareness during functional activites type of (SLP) Outcome: Not Met (add Reason)   Problem: RH Memory Goal: LTG Patient will demonstrate ability for day to day (SLP) Description: LTG:   Patient will demonstrate ability for day to day recall/carryover during cognitive/linguistic activities with assist  (SLP) Outcome: Completed/Met Goal: LTG Patient will use memory compensatory aids to (SLP) Description: LTG:  Patient will use memory compensatory aids to recall biographical/new, daily complex information with cues (SLP) Outcome: Completed/Met   Problem: RH Attention Goal: LTG Patient will demonstrate this level of attention during functional activites (SLP) Description: LTG:  Patient will will demonstrate this level of attention during functional activites (SLP) Outcome: Completed/Met

## 2021-12-01 NOTE — Progress Notes (Signed)
Speech Language Pathology Discharge Summary  Patient Details  Name: Kirk [name] MRN: [id number] Date of Birth: September 10, 1960  Date of Discharge from SLP service:November 30, 2021  Patient has met 3 of 4 long term goals.  Patient to discharge at overall Supervision;Min level.  Reasons goals not met: short LOS; pt transferred off unit for procedure   Clinical Impression/Discharge Summary: Patient has made functional gains and has met 3 of 4 long-term goals this admission. Pt limited by lethargy, fatigue, and ongoing headache. Pt is currently completing functional and basic-to-mildly complex cognitive tasks with supervision-to-min A verbal cues in regards to attention, problem solving, judgement/safety awareness, and functional recall. Patient's care partner is independent to provide the necessary physical and cognitive assistance at discharge. Patient would benefit from continued SLP services in home health setting to maximize cognitive-linguistic function and functional independence.    Care Partner:  Caregiver Able to Provide Assistance: Yes  Type of Caregiver Assistance: Physical;Cognitive  Recommendation:  24 hour supervision/assistance;Home Health SLP  Rationale for SLP Follow Up: Maximize cognitive function and independence   Equipment: none   Reasons for discharge: discharged from rehab for medical procedure   Patient/Family Agrees with Progress Made and Goals Achieved: Yes    [name] T Esly Selvage 12/01/2021, 3:59 PM

## 2021-12-01 NOTE — Progress Notes (Signed)
Dr. Kathyrn Sheriff called back and she gave an order for  telemetry monitoring. She stated she will see the patient in the morning. Will apply the tele and continue to monitor.

## 2021-12-02 LAB — BPAM RBC
Blood Product Expiration Date: 202311302359
ISSUE DATE / TIME: 202311101007
Unit Type and Rh: 6200

## 2021-12-02 LAB — TYPE AND SCREEN
ABO/RH(D): A POS
Antibody Screen: NEGATIVE
Unit division: 0

## 2021-12-03 ENCOUNTER — Telehealth: Payer: Self-pay | Admitting: Nephrology

## 2021-12-03 NOTE — Telephone Encounter (Signed)
Transition of care contact from inpatient facility  Date of discharge: 12/01/21 Date of contact: 12/03/21 Method: Phone Spoke to: Patient's Wife  Patient contacted to discuss transition of care from recent inpatient hospitalization. Patient was admitted to [facility] [address] from 11/07/21-12/01/21 ... with discharge diagnosis of ... Subdural hematoma (HCC) with right MMA embolization     Medication changes were reviewed.  Patient will follow up with his/her outpatient HD unit on:mwf  hd center

## 2021-12-04 ENCOUNTER — Telehealth: Payer: Self-pay

## 2021-12-04 DIAGNOSIS — D631 Anemia in chronic kidney disease: Secondary | ICD-10-CM | POA: Diagnosis not present

## 2021-12-04 DIAGNOSIS — N2581 Secondary hyperparathyroidism of renal origin: Secondary | ICD-10-CM | POA: Diagnosis not present

## 2021-12-04 DIAGNOSIS — N186 End stage renal disease: Secondary | ICD-10-CM | POA: Diagnosis not present

## 2021-12-04 DIAGNOSIS — Z992 Dependence on renal dialysis: Secondary | ICD-10-CM | POA: Diagnosis not present

## 2021-12-04 DIAGNOSIS — D509 Iron deficiency anemia, unspecified: Secondary | ICD-10-CM | POA: Diagnosis not present

## 2021-12-04 LAB — POCT I-STAT, CHEM 8
BUN: 27 mg/dL — ABNORMAL HIGH (ref 6–20)
Calcium, Ion: 1.09 mmol/L — ABNORMAL LOW (ref 1.15–1.40)
Chloride: 94 mmol/L — ABNORMAL LOW (ref 98–111)
Creatinine, Ser: 9.1 mg/dL — ABNORMAL HIGH (ref 0.61–1.24)
Glucose, Bld: 94 mg/dL (ref 70–99)
HCT: 23 % — ABNORMAL LOW (ref 39.0–52.0)
Hemoglobin: 7.8 g/dL — ABNORMAL LOW (ref 13.0–17.0)
Potassium: 4.8 mmol/L (ref 3.5–5.1)
Sodium: 133 mmol/L — ABNORMAL LOW (ref 135–145)
TCO2: 30 mmol/L (ref 22–32)

## 2021-12-04 NOTE — Patient Outreach (Signed)
  Care Coordination TOC Note Transition Care Management Unsuccessful Follow-up Telephone Call  Date of discharge and from where:  12/01/21-[place]   Attempts:  1st Attempt  Reason for unsuccessful TCM follow-up call:  No answer/busy   Enzo Montgomery, RN,BSN,CCM [place] [facility] Telephonic Care Management Coordinator Direct Phone: [phone] Toll Free: [phone] Fax: [phone]

## 2021-12-04 NOTE — Patient Outreach (Signed)
  Care Coordination TOC Note Transition Care Management Unsuccessful Follow-up Telephone Call  Date of discharge and from where:  12/01/21-[place]  Attempts:  2nd Attempt  Reason for unsuccessful TCM follow-up call:  Unable to reach patient    Hetty Blend [place] Management Telephonic Care Management Coordinator Direct Phone: [phone] Toll Free: [phone] Fax: [phone]

## 2021-12-05 ENCOUNTER — Other Ambulatory Visit: Payer: Self-pay

## 2021-12-05 ENCOUNTER — Telehealth: Payer: Self-pay

## 2021-12-05 MED ORDER — LEVETIRACETAM 250 MG PO TABS: 250.0000 mg | ORAL_TABLET | ORAL | 1 refills | Status: DC

## 2021-12-05 MED ORDER — LEVETIRACETAM 750 MG PO TABS: 750.0000 mg | ORAL_TABLET | Freq: Every day | ORAL | 1 refills | Status: DC

## 2021-12-05 MED ORDER — LOSARTAN POTASSIUM 100 MG PO TABS: 100.0000 mg | ORAL_TABLET | Freq: Every evening | ORAL | 1 refills | Status: DC

## 2021-12-05 NOTE — Patient Outreach (Signed)
  Care Coordination TOC Note Transition Care Management Follow-up Telephone Call Date of discharge and from where: 12/01/21-[place] Dx: cerebral aneurysm How have you been since you were released from the hospital? Call completed with spouse. She reports that patient is doing fairly well. He is working on trying to regain balance. She voices that his memory remains a little fuzzy at times.  Any questions or concerns? Yes-Spouse reports that patient was not given script for Keppra and Cozaar. She voices that these are both new meds for patient. She called pharmacy and meds not there. Spouse also has concerns regarding how to take Keppra as two different dosages and instructions listed on d/c paperwork.   * levETIRAcetam 750 MG tablet Commonly known as: KEPPRA Take 1 tablet (750 mg total) by mouth daily. * levETIRAcetam 250 MG tablet Commonly known as: KEPPRA Take 1 tablet (250 mg total) by mouth every Monday, Wednesday, and Friday with hemodialysis.  losartan 100 MG tablet Commonly known as: COZAAR Take 1 tablet (100 mg total) by mouth every evening.  RN CM contacted PCP office to alert them of this and request follow up and clarification on meds.   Items Reviewed: Did the pt receive and understand the discharge instructions provided? Yes  Medications obtained and verified? Yes  Other? Yes  Any new allergies since your discharge? No  Dietary orders reviewed? Yes Do you have support at home? Yes   Home Care and Equipment/Supplies: Were home health services ordered? not applicable-Patient has been set up with outpatient rehab-appt on 12/12/21 If so, what is the name of the agency? N/A  Has the agency set up a time to come to the patient's home? not applicable Were any new equipment or medical supplies ordered?  Yes: 3N1 commode,cane What is the name of the medical supply agency? [employer] Were you able to get the supplies/equipment? yes Do you have any questions related to the  use of the equipment or supplies? No  Functional Questionnaire: (I = Independent and D = Dependent) ADLs: A  Bathing/Dressing- A  Meal Prep- A  Eating- I  Maintaining continence- I  Transferring/Ambulation- A  Managing Meds- A  Follow up appointments reviewed:  PCP Hospital f/u appt confirmed? No Wife reported she will call office today to make an appt. [facility] f/u appt confirmed? No Wife reported she will call office today to make an appt.. Are transportation arrangements needed? No  If their condition worsens, is the pt aware to call PCP or go to the Emergency Dept.? Yes Was the patient provided with contact information for the PCP's office or ED? Yes Was to pt encouraged to call back with questions or concerns? Yes  SDOH assessments and interventions completed:   Yes  Care Coordination Interventions Activated:  Yes   Care Coordination Interventions:  RN CM contacted PCP office to follow up on medication issues. Office will follow up with pt/spouse.    Encounter Outcome:  Pt. Visit Completed    Enzo Montgomery, RN,BSN,CCM [place] Management Telephonic Care Management Coordinator Direct Phone: [phone] Toll Free: [phone] Fax: [phone]

## 2021-12-05 NOTE — Telephone Encounter (Signed)
Nurse with [facility] called stating patient needs the following medications send into pharmacy: Keppra '250mg'$ , Keppra 750 mg and Losartan 100 [facility] to the [facility] on [address] per wife. Patient was discharged from hospital and the medications listed above was not received or send to be filled. Also wife would like clarification on how patient should take the Keppra 750 mg.

## 2021-12-06 DIAGNOSIS — I729 Aneurysm of unspecified site: Secondary | ICD-10-CM | POA: Diagnosis not present

## 2021-12-06 DIAGNOSIS — N186 End stage renal disease: Secondary | ICD-10-CM | POA: Diagnosis not present

## 2021-12-06 DIAGNOSIS — I6200 Nontraumatic subdural hemorrhage, unspecified: Secondary | ICD-10-CM | POA: Diagnosis not present

## 2021-12-06 DIAGNOSIS — Z992 Dependence on renal dialysis: Secondary | ICD-10-CM | POA: Diagnosis not present

## 2021-12-06 DIAGNOSIS — N2581 Secondary hyperparathyroidism of renal origin: Secondary | ICD-10-CM | POA: Diagnosis not present

## 2021-12-06 DIAGNOSIS — D631 Anemia in chronic kidney disease: Secondary | ICD-10-CM | POA: Diagnosis not present

## 2021-12-06 DIAGNOSIS — D509 Iron deficiency anemia, unspecified: Secondary | ICD-10-CM | POA: Diagnosis not present

## 2021-12-06 NOTE — Telephone Encounter (Signed)
Patient's wife [name]) was advised and states that she will talk with Dinah Ngetich,NP tomorrow,12/07/21 more at his appointment.

## 2021-12-07 ENCOUNTER — Telehealth: Payer: Self-pay

## 2021-12-07 ENCOUNTER — Encounter: Payer: Self-pay | Admitting: Family

## 2021-12-07 ENCOUNTER — Ambulatory Visit (INDEPENDENT_AMBULATORY_CARE_PROVIDER_SITE_OTHER): Payer: Medicare Other | Admitting: Family

## 2021-12-07 VITALS — BP 160/60 | HR 74 | Temp 98.2°F | Resp 18 | Ht 67.0 in | Wt 199.6 lb

## 2021-12-07 DIAGNOSIS — K219 Gastro-esophageal reflux disease without esophagitis: Secondary | ICD-10-CM

## 2021-12-07 DIAGNOSIS — R300 Dysuria: Secondary | ICD-10-CM | POA: Diagnosis not present

## 2021-12-07 DIAGNOSIS — I120 Hypertensive chronic kidney disease with stage 5 chronic kidney disease or end stage renal disease: Secondary | ICD-10-CM | POA: Diagnosis not present

## 2021-12-07 DIAGNOSIS — E782 Mixed hyperlipidemia: Secondary | ICD-10-CM

## 2021-12-07 DIAGNOSIS — E1121 Type 2 diabetes mellitus with diabetic nephropathy: Secondary | ICD-10-CM | POA: Diagnosis not present

## 2021-12-07 DIAGNOSIS — N186 End stage renal disease: Secondary | ICD-10-CM

## 2021-12-07 DIAGNOSIS — G44209 Tension-type headache, unspecified, not intractable: Secondary | ICD-10-CM

## 2021-12-07 DIAGNOSIS — R569 Unspecified convulsions: Secondary | ICD-10-CM | POA: Diagnosis not present

## 2021-12-07 MED ORDER — PREGABALIN 75 MG PO CAPS: 75.0000 mg | ORAL_CAPSULE | Freq: Every day | ORAL | Status: DC

## 2021-12-07 MED ORDER — CIPROFLOXACIN HCL 500 MG PO TABS: 500.0000 mg | ORAL_TABLET | Freq: Every day | ORAL | 0 refills | Status: AC

## 2021-12-07 MED ORDER — PREGABALIN 75 MG PO CAPS: 75.0000 mg | ORAL_CAPSULE | Freq: Every day | ORAL | 3 refills | Status: DC

## 2021-12-07 NOTE — Telephone Encounter (Signed)
Fax received indicating medication approved 01/22/2021-12/07/2022   I called the pharmacy and left a detailed message informing them of this newly developed information

## 2021-12-07 NOTE — Telephone Encounter (Signed)
PA received from [facility] [address] for Lyrica 75 mg.  KEY: [id number]  PA was initiated, see below:    Awaiting reply from [facility]

## 2021-12-07 NOTE — Progress Notes (Signed)
Provider: Marlowe Sax FNP-C  Amiri Riechers, Nelda Bucks, NP  Patient Care Team: Layne Lebon, Nelda Bucks, NP as PCP - General (Family Medicine) [name], MD as PCP - Cardiology (Cardiology) Jovita Kussmaul, MD as Consulting Physician (General Surgery) [name], MD as Consulting Physician (Pulmonary Disease) Estanislado Emms, MD (Inactive) as Consulting Physician (Nephrology) Elam Dutch, MD (Inactive) as Consulting Physician (Vascular Surgery) [name], MD as Consulting Physician (Cardiology) Milus Banister, MD as Attending Physician (Gastroenterology) Jamal Maes, MD as Consulting Physician (Nephrology)  Extended Emergency Contact Information Primary Emergency Contact: [name] Address: [address]          [place], [place] [address] Johnnette Litter of [place] Phone: [phone] Mobile Phone: [phone] Relation: Spouse  Code Status:  Full Code  Goals of care: Advanced Directive information    12/07/2021   11:07 AM  Advanced Directives  Does Patient Have a Medical Advance Directive? No  Would patient like information on creating a medical advance directive? No - Patient declined     Chief Complaint  Patient presents with   Hospitalization [facility] follow up 11/21/2021-11/30/2021 for Cerebral Aneurysm.     HPI:  Pt is a 61 y.o. male seen today for an acute visit for follow-up hospitalization from 11/21/2021 to 11/30/2021 for cerebral aneurysm and subdural hematoma.  After he presented to the hospital in 17 2023 with a seizure activity found by wife.  Seizure activity lasted about 2 minutes.  Wife states had reported headaches for the past few days prior to the seizure.  Lab work-up was unremarkable.  EEG showed no seizure activity.  Also had an echocardiogram which showed ejection fraction of 50 to 55% with no wall motion abnormalities except grade 1 diastolic dysfunction.  He had a CT scan which showed acute subdural hematoma  overlying the right frontal convexity measuring 9 mm in radial diameter also had mild adjacent mass effect in 1 to 2 mm of left midline shift at the septum pellucidium.  Neuroscience surgery consulted had serial CT/MRI.  Right middle lobe meningeal artery embolization on 11/30/2021.  All blood thinners were placed on hold.  Pain was managed with oxycodone, Robaxin and Lyrica for headaches.  He was discharged home to follow-up with outpatient PT/OT/ST wife states has an appointment 12/12/2021 at [facility] neuro rehab.He was also advised to follow-up with neurologist has upcoming appointment 12/20/2021. He denies any recent seizures since hospital discharge.No fall episode.Still has headache but improved compared to during hospitalization.  Appetite described as good.  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)    Anxiety    Asthma    Asthma    moderate persistent   Carpal tunnel syndrome    Cellulitis and abscess of trunk    Cholelithiasis 07/13/2014   Chronic headaches    Debility, unspecified    Dermatophytosis of the body    Dysrhythmia    history of   Edema    End stage renal disease on dialysis [facility])    "MWF; [facility]" (10/21/2014)   Essential hypertension, benign    GERD (gastroesophageal reflux disease)    Gout, unspecified    HTN (hypertension)    Hypertrophy of prostate without urinary obstruction and other lower urinary tract symptoms (LUTS)    Hypotension, unspecified  Impotence of organic origin    Insomnia, unspecified    Kidney replaced by transplant    Localization-related (focal) (partial) epilepsy and epileptic syndromes with complex partial seizures, without mention of intractable epilepsy    12-15-19- Wife states he has NEVER had a seizure    Lumbago    Memory loss    OSA on CPAP    Other and  unspecified hyperlipidemia    controlled /managed per wife    Other chronic nonalcoholic liver disease    Other malaise and fatigue    Other nonspecific abnormal serum enzyme levels    Pain in joint, lower leg    Pain in joint, upper arm    Pneumonia "several times"   PONV (postoperative nausea and vomiting)    Renal dialysis status(V45.11) 02/05/2010   restarted 01/02/13 ofter renal trransplant failure   Secondary hyperparathyroidism (of renal origin)    Shortness of breath    Sleep apnea    wears cpap    Tension headache    Unspecified constipation    Unspecified essential hypertension    Unspecified hereditary and idiopathic peripheral neuropathy    Unspecified vitamin D deficiency    Past Surgical History:  Procedure Laterality Date   AV FISTULA PLACEMENT Left ?2010   "forearm; at [place]"   [place]  03/21/2011   CHOLECYSTECTOMY N/A 10/21/2014   Procedure: LAPAROSCOPIC CHOLECYSTECTOMY WITH INTRAOPERATIVE CHOLANGIOGRAM;  Surgeon: Autumn Messing III, MD;  Location: [place];  Service: General;  Laterality: N/A;   COLONOSCOPY     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   IR ANGIO INTRA EXTRACRAN SEL INTERNAL CAROTID UNI R MOD SED  11/30/2021   IR ANGIOGRAM FOLLOW UP STUDY  11/30/2021   IR NEURO EACH ADD'L AFTER BASIC UNI RIGHT (MS)  11/30/2021   IR TRANSCATH/EMBOLIZ  11/30/2021   KIDNEY TRANSPLANT  08/17/2011   [facility]    LAPAROSCOPIC CHOLECYSTECTOMY  10/21/2014   w/IOC   LEFT HEART CATHETERIZATION WITH CORONARY ANGIOGRAM N/A 03/21/2011   Procedure: LEFT HEART CATHETERIZATION WITH CORONARY ANGIOGRAM;  Surgeon: [name], MD;  Location: [facility] CATH LAB;  Service: Cardiovascular;  Laterality: N/A;   NEPHRECTOMY  08/2013   removed transplaned kidney   POLYPECTOMY     POSTERIOR FUSION CERVICAL SPINE  06/25/2012   for spinal stenosis   RADIOLOGY WITH ANESTHESIA N/A 11/30/2021   Procedure: RIGHT MIDDLE MENINGEAL ARTERY  EMBOLIZATION;  Surgeon: Radiologist, Medication, MD;  Location: [place];  Service: Radiology;  Laterality: N/A;   VASECTOMY  2010    Allergies  Allergen Reactions   Codeine Nausea And Vomiting   Hydrocodone-Acetaminophen Nausea And Vomiting    Outpatient Encounter Medications as of 12/07/2021  Medication Sig   acetaminophen (TYLENOL) 500 MG tablet Take 1 tablet (500 mg total) by mouth every 6 (six) hours as needed for moderate pain.   amLODipine (NORVASC) 10 MG tablet Take 1 tablet (10 mg total) by mouth every evening.   calcitRIOL (ROCALTROL) 0.5 MCG capsule Take 2 capsules (1 mcg total) by mouth every Monday, Wednesday, and Friday with hemodialysis.   Cholecalciferol (VITAMIN D3) 25 MCG (1000 UT) CAPS Take 4,000 Units by mouth daily.   cinacalcet (SENSIPAR) 30 MG tablet Take 2 tablets (60 mg total) by mouth 3 (three) times a week. (Patient taking differently: Take 60 mg by mouth every evening.)   cloNIDine (CATAPRES) 0.2 MG tablet Take 1 tablet (0.2 mg total) by mouth every  4 (four) hours as needed (Sbp >170 or dbp>100).   Darbepoetin Alfa (ARANESP) 60 MCG/0.3ML SOSY injection Inject 0.3 mLs (60 mcg total) into the skin every Saturday at 6 PM.   doxazosin (CARDURA) 4 MG tablet Take 1 tablet (4 mg total) by mouth daily.   guaiFENesin (MUCINEX) 600 MG 12 hr tablet Take 1,200 mg by mouth 2 (two) times daily.   hydrALAZINE (APRESOLINE) 100 MG tablet Take 1 tablet (100 mg total) by mouth every 8 (eight) hours. (Patient taking differently: Take 100 mg by mouth daily as needed (If BP >140).)   isosorbide mononitrate (IMDUR) 60 MG 24 hr tablet Take 1 tablet (60 mg total) by mouth daily.   levETIRAcetam (KEPPRA) 250 MG tablet Take 1 tablet (250 mg total) by mouth every Monday, Wednesday, and Friday with hemodialysis.   levETIRAcetam (KEPPRA) 750 MG tablet Take 1 tablet (750 mg total) by mouth daily.   losartan (COZAAR) 100 MG tablet Take 1 tablet (100 mg total) by mouth every evening.    methocarbamol (ROBAXIN) 750 MG tablet Take 1 tablet (750 mg total) by mouth 4 (four) times daily. (Patient taking differently: Take 750 mg by mouth every 6 (six) hours as needed for muscle spasms.)   metoprolol tartrate (LOPRESSOR) 100 MG tablet TAKE 1 TABLET BY MOUTH TWICE A DAY (Patient taking differently: Take 100 mg by mouth 2 (two) times daily.)   multivitamin (RENA-VIT) TABS tablet Take 1 tablet by mouth daily.   nitroGLYCERIN (NITROSTAT) 0.4 MG SL tablet Place 1 tablet (0.4 mg total) under the tongue every 5 (five) minutes as needed for chest pain.   oxyCODONE (OXY IR/ROXICODONE) 5 MG immediate release tablet Take 1-2 tablets (5-10 mg total) by mouth every 4 (four) hours as needed for moderate pain.   pantoprazole (PROTONIX) 40 MG tablet Take 1 tablet (40 mg total) by mouth 2 (two) times daily before a meal.   pravastatin (PRAVACHOL) 40 MG tablet TAKE 1 TABLET BY MOUTH EVERY DAY IN THE EVENING (Patient taking differently: Take 40 mg by mouth daily.)   senna-docusate (SENOKOT-S) 8.6-50 MG tablet Take 1 tablet by mouth 2 (two) times daily.   sevelamer carbonate (RENVELA) 800 MG tablet Take 2 tablets (1,600 mg total) by mouth as needed (snacks). (Patient taking differently: Take 1,600 mg by mouth 2 (two) times daily with a meal.)   sevelamer carbonate (RENVELA) 800 MG tablet Take 4 tablets (3,200 mg total) by mouth 3 (three) times daily with meals.   sucralfate (CARAFATE) 1 GM/10ML suspension Take 10 mLs (1 g total) by mouth 4 (four) times daily -  with meals and at bedtime.   traZODone (DESYREL) 50 MG tablet Take 1 tablet (50 mg total) by mouth at bedtime.   [DISCONTINUED] pregabalin (LYRICA) 75 MG capsule Take 1 capsule (75 mg total) by mouth daily. (Patient not taking: Reported on 11/30/2021)   No facility-administered encounter medications on file as of 12/07/2021.    Review of Systems  Constitutional:  Negative for appetite change, chills, fatigue, fever and unexpected weight change.   HENT:  Negative for congestion, dental problem, ear discharge, ear pain, facial swelling, hearing loss, nosebleeds, postnasal drip, rhinorrhea, sinus pressure, sinus pain, sneezing, sore throat, tinnitus and trouble swallowing.   Eyes:  Negative for pain, discharge, redness, itching and visual disturbance.  Respiratory:  Negative for cough, chest tightness, shortness of breath and wheezing.   Cardiovascular:  Negative for chest pain, palpitations and leg swelling.  Gastrointestinal:  Negative for abdominal distention, abdominal pain, blood  in stool, constipation, diarrhea, nausea and vomiting.  Endocrine: Negative for cold intolerance, heat intolerance, polydipsia, polyphagia and polyuria.  Genitourinary:  Positive for dysuria. Negative for difficulty urinating, flank pain, frequency, hematuria and urgency.       On hemodialysis Mon,Wed and Fri  Request antibiotics states symptoms of UTI despite voiding very small amounts.Cipro has been effective unable to provide urine specimen for evaluation.    Musculoskeletal:  Positive for arthralgias, back pain, gait problem and neck pain. Negative for joint swelling, myalgias and neck stiffness.  Skin:  Negative for color change, pallor, rash and wound.  Neurological:  Positive for headaches. Negative for dizziness, syncope, speech difficulty, weakness, light-headedness and numbness.       Chronic headaches   Hematological:  Does not bruise/bleed easily.  Psychiatric/Behavioral:  Negative for agitation, behavioral problems, confusion, hallucinations, self-injury, sleep disturbance and suicidal ideas. The patient is not nervous/anxious.     Immunization History  Administered Date(s) Administered   Influenza Whole 11/01/2010   Influenza, Seasonal, Injecte, Preservative Fre 10/29/2012   Influenza-Unspecified 11/21/2011, 11/05/2012, 09/22/2013, 09/22/2017, 11/23/2019, 10/28/2020, 12/04/2020   Moderna Sars-Covid-2 Vaccination 04/06/2019, 05/07/2019,  03/31/2020   PPD Test 12/26/2012   Pneumococcal Polysaccharide-23 11/01/2010, 10/29/2012   Pneumococcal-Unspecified 11/05/2012   Tdap 01/22/2006, 06/08/2021   Pertinent  Health Maintenance Due  Topic Date Due   INFLUENZA VACCINE  08/22/2021   HEMOGLOBIN A1C  11/25/2021   COLONOSCOPY (Pts 45-10yr Insurance coverage will need to be confirmed)  12/29/2026   FOOT EXAM  Discontinued   OPHTHALMOLOGY EXAM  Discontinued      11/29/2021    9:00 PM 11/30/2021   10:00 AM 11/30/2021    8:36 PM 12/01/2021    8:00 AM 12/07/2021   11:07 AM  Fall Risk  Falls in the past year?     0  Was there an injury with Fall?     0  Fall Risk Category Calculator     0  Fall Risk Category     Low  Patient Fall Risk Level High fall risk High fall risk High fall risk High fall risk Low fall risk  Patient at Risk for Falls Due to     No Fall Risks  Fall risk Follow up     Falls evaluation completed   Functional Status Survey:    Vitals:   12/07/21 1106  BP: (!) 160/60  Pulse: 74  Resp: 18  Temp: 98.2 F (36.8 C)  SpO2: 93%  Weight: 199 lb 9.6 oz (90.5 kg)  Height: '5\' 7"'$  (1.702 m)   Body mass index is 31.26 kg/m. Physical Exam Vitals reviewed.  Constitutional:      General: He is not in acute distress.    Appearance: Normal appearance. He is normal weight. He is not ill-appearing or diaphoretic.  HENT:     Head: Normocephalic.     Right Ear: Ear canal and external ear normal. There is no impacted cerumen.     Left Ear: Tympanic membrane, ear canal and external ear normal. There is no impacted cerumen.     Ears:     Comments: Right ear TM chronic perforation     Nose: Nose normal. No congestion or rhinorrhea.     Mouth/Throat:     Mouth: Mucous membranes are moist.     Pharynx: Oropharynx is clear. No oropharyngeal exudate or posterior oropharyngeal erythema.  Eyes:     General: No scleral icterus.       Right eye: No discharge.  Left eye: No discharge.     Extraocular Movements:  Extraocular movements intact.     Conjunctiva/sclera: Conjunctivae normal.     Pupils: Pupils are equal, round, and reactive to light.  Neck:     Vascular: No carotid bruit.  Cardiovascular:     Rate and Rhythm: Normal rate and regular rhythm.     Pulses: Normal pulses.     Heart sounds: Murmur heard.     No friction rub. No gallop.  Pulmonary:     Effort: Pulmonary effort is normal. No respiratory distress.     Breath sounds: Normal breath sounds. No wheezing, rhonchi or rales.  Chest:     Chest wall: No tenderness.  Abdominal:     General: Bowel sounds are normal. There is no distension.     Palpations: Abdomen is soft. There is no mass.     Tenderness: There is no abdominal tenderness. There is no right CVA tenderness, left CVA tenderness, guarding or rebound.  Musculoskeletal:        General: No swelling or tenderness. Normal range of motion.     Cervical back: Normal range of motion. No rigidity or tenderness.     Right lower leg: No edema.     Left lower leg: No edema.     Comments: Walks with a cane   Lymphadenopathy:     Cervical: No cervical adenopathy.  Skin:    General: Skin is warm and dry.     Coloration: Skin is not pale.     Findings: No bruising, erythema, lesion or rash.  Neurological:     Mental Status: He is alert and oriented to person, place, and time.     Cranial Nerves: No cranial nerve deficit.     Sensory: No sensory deficit.     Motor: No weakness.     Coordination: Coordination normal.     Gait: Gait abnormal.  Psychiatric:        Mood and Affect: Mood normal.        Speech: Speech normal.        Behavior: Behavior normal.        Thought Content: Thought content normal.        Judgment: Judgment normal.     Labs reviewed: Recent Labs    11/13/21 0435 11/14/21 0550 11/15/21 0302 11/16/21 0834 11/19/21 0326 11/24/21 1610 11/30/21 1154 11/30/21 2116 12/01/21 0839  NA 130* 130* 130*   < > 130* 130* 133*  --  134*  K 4.8 4.9 4.8   < >  3.9 5.0 4.8  --  4.4  CL 89* 90* 91*   < > 91* 93* 94*  --  97*  CO2 '24 26 24   '$ < > 25 26  --   --  26  GLUCOSE 98 114* 87   < > 102* 103* 94  --  119*  BUN 64* 38* 59*   < > 32* 39* 27*  --  36*  CREATININE 13.05* 9.05* 12.36*   < > 10.51* 11.57* 9.10* 9.55* 10.55*  CALCIUM 9.0 9.6 9.5   < > 9.1 8.9  --   --  8.8*  MG 2.6* 2.2 2.3  --   --   --   --   --   --   PHOS  --  5.6* 6.3*   < > 5.7* 4.3  --   --  3.5   < > = values in this interval not displayed.   Recent  Labs    05/16/21 1611 11/07/21 2144 11/10/21 1246 11/15/21 0302 11/16/21 0834 11/19/21 0326 11/24/21 1610 12/01/21 0839  AST 11* 11*  --  15  --   --   --   --   ALT 9 11  --  11  --   --   --   --   ALKPHOS 57 55  --  63  --   --   --   --   BILITOT 0.4 0.7  --  1.6*  --   --   --   --   PROT 7.1 6.5  --  6.5  --   --   --   --   ALBUMIN 4.1 3.2*   < > 3.2*   < > 3.1* 3.1* 3.1*   < > = values in this interval not displayed.   Recent Labs    11/13/21 0435 11/14/21 0550 11/15/21 0302 11/16/21 0834 11/29/21 0618 11/30/21 1154 11/30/21 2116 12/01/21 0839 12/01/21 1439  WBC 5.8   < > 5.6   < > 4.2  --  3.5* 3.8*  --   NEUTROABS 4.3  --  4.5  --  2.7  --   --   --   --   HGB 8.9*   < > 9.5*   < > 8.0*   < > 7.2* 6.7* 8.2*  HCT 26.6*   < > 29.1*   < > 23.9*   < > 22.4* 20.5* 24.8*  MCV 92.0   < > 93.9   < > 93.0  --  95.7 94.5  --   PLT 89*   < > 82*   < > 81*  --  73* 74*  --    < > = values in this interval not displayed.   Lab Results  Component Value Date   TSH 2.245 11/16/2021   Lab Results  Component Value Date   HGBA1C 4.8 05/25/2021   Lab Results  Component Value Date   CHOL 91 05/25/2021   HDL 31 (L) 05/25/2021   LDLCALC 42 05/25/2021   TRIG 95 05/25/2021   CHOLHDL 2.9 05/25/2021    Significant Diagnostic Results in last 30 days:  IR Transcath/Emboliz  Result Date: 11/30/2021 PROCEDURE: ONYX EMBOLIZATION OF RIGHT MIDDLE MENINGEAL ARTERY HISTORY: The patient is a 61 year old man  initially presenting several weeks ago with small subdural hematoma. Serial scans have demonstrated progressive enlargement of the subdural hematoma although patient remains clinically well. He therefore was a good candidate middle meningeal embolization. ACCESS: The technical aspects of the procedure as well as its potential risks and benefits were reviewed with the patient and his family. These risks included but were not limited to stroke, intracranial hemorrhage, bleeding, infection, allergic reaction, damage to organs or vital structures, stroke, non-diagnostic procedure, and the catastrophic outcomes of heart attack, coma, and death. With an understanding of these risks, informed consent was obtained and witnessed. The patient was placed in the supine position on the angiography table and the skin of right groin prepped in the usual sterile fashion. The procedure was performed under general anesthesia. A 6 French sheath was introduced in the right common femoral artery using Seldinger technique. MEDICATIONS: HEPARIN: 2000 Units total. CONTRAST:  54m OMNIPAQUE IOHEXOL 300 MG/ML  SOLNcc, Omnipaque 300 FLUOROSCOPY TIME:  FLUOROSCOPY TIME: See IR records TECHNIQUE: CATHETERS AND WIRES 5-French JB-1 catheter 180 cm 0.035" glidewire 280cm stiff glidewire 6-French Envoy MPC 5-French Envoy STR Apollo microcatheter x2 Synchro-10 microwire LIQUID  EMBOLIC USED UUVO-53 VESSELS CATHETERIZED Right internal carotid Right external Right middle meningeal artery Right common femoral VESSELS STUDIED Right internal carotid Right external (pre-embolization) Right middle meningeal artery (pre-embolization) Right external (post-embolization) Right internal (final control) Right common femoral PROCEDURAL NARRATIVE A 5-Fr JB-1 glide catheter was advanced over a 0.035 glidewire into the aortic arch. The right internal and carotid arteries catheterized and cervical / cerebral angiograms taken. Exchange Glidewire placed into the mid  cervical external carotid. The JB 1 glide catheter exchanged for 5 French straight Envoy guide catheter. Under roadmap guidance, the Apollo microcatheter introduced over microwire. The proximal intracranial middle meningeal artery selectively catheterized. Microcatheter run was taken. Catheter was then flushed with DMSO however during initial injection of onyx, it appeared the catheter was clogged. I therefore removed this microcatheter. In a similar fashion, under roadmap guidance a second Apollo microcatheter was navigated into the proximal middle meningeal artery. After another microcatheter run, the right middle meningeal artery was embolized with onyx 18 after flushing the catheter DMSO. This was done under standard roadmap and blank roadmap. After embolization, the microcatheter was removed without incident. Angiogram was taken from the guide catheter in the external carotid artery. The catheter was then navigated into the internal carotid artery over Glidewire and final control angiogram was taken. Guide catheter was then removed. FINDINGS: Right internal carotid, head: Injection reveals the presence of a widely patent ICA, M1, and A1 segments and their branches. The parenchymal and venous phases are normal, however capillary phase does demonstrates some mass effect upon the right frontal lobe. The venous sinuses are widely patent. Right external carotid head  (pre embolization) Visualized cranial branches of the right external carotid artery are. The right middle meningeal artery is well visualized. There appears to be smaller accessory middle meningeal. No external to internal anastomoses are noted. Right middle meningeal (pre embolization) Microcatheter injection from the intracranial middle meningeal artery again reveals some perfusion of the dura. There is perhaps faint contrast blush in the region of the known subdural hematoma possibly representing perfusion of the subdural membrane. Right external  carotid, head (post embolization) Angiogram taken after embolization of the right middle meningeal artery again reveals occlusion of the middle meningeal artery at the level of the foramen spinosum. The remainder of the visualized cranial branches of the external carotid artery are unremarkable. Right internal carotid, head (final control) The internal carotid artery is widely patent with a normal bifurcation. The ACA and MCA branches are unremarkable. Capillary phase again demonstrates stable appearance of mild mass effect upon the frontal lobe. No perfusion deficits or branch occlusions are noted. Venous sinuses are patent. Right femoral: Normal vessel. No significant atherosclerotic disease. Arterial sheath in adequate position. DISPOSITION: Upon completion of the study, the femoral sheath was removed and hemostasis obtained by manual compression. Good proximal and distal lower extremity pulses were documented upon achievement of hemostasis. The procedure was well tolerated and no early complications were observed. The patient was transferred to the postanesthesia care unit in stable hemodynamic condition. IMPRESSION: 1. Successful onyx embolization of the right middle meningeal artery for treatment of chronic subdural hematoma The preliminary results of this procedure were shared with the patient's family. Electronically Signed   By: Consuella Lose   On: 11/30/2021 14:46   IR ANGIO INTRA EXTRACRAN SEL INTERNAL CAROTID UNI R MOD SED  Result Date: 11/30/2021 PROCEDURE: ONYX EMBOLIZATION OF RIGHT MIDDLE MENINGEAL ARTERY HISTORY: The patient is a 61 year old man initially presenting several weeks ago with small subdural  hematoma. Serial scans have demonstrated progressive enlargement of the subdural hematoma although patient remains clinically well. He therefore was a good candidate middle meningeal embolization. ACCESS: The technical aspects of the procedure as well as its potential risks and benefits were  reviewed with the patient and his family. These risks included but were not limited to stroke, intracranial hemorrhage, bleeding, infection, allergic reaction, damage to organs or vital structures, stroke, non-diagnostic procedure, and the catastrophic outcomes of heart attack, coma, and death. With an understanding of these risks, informed consent was obtained and witnessed. The patient was placed in the supine position on the angiography table and the skin of right groin prepped in the usual sterile fashion. The procedure was performed under general anesthesia. A 6 French sheath was introduced in the right common femoral artery using Seldinger technique. MEDICATIONS: HEPARIN: 2000 Units total. CONTRAST:  108m OMNIPAQUE IOHEXOL 300 MG/ML  SOLNcc, Omnipaque 300 FLUOROSCOPY TIME:  FLUOROSCOPY TIME: See IR records TECHNIQUE: CATHETERS AND WIRES 5-French JB-1 catheter 180 cm 0.035" glidewire 280cm stiff glidewire 6-French Envoy MPC 5-French Envoy STR Apollo microcatheter x2 Synchro-10 microwire LIQUID EMBOLIC USED [id number]-63VESSELS CATHETERIZED Right internal carotid Right external Right middle meningeal artery Right common femoral VESSELS STUDIED Right internal carotid Right external (pre-embolization) Right middle meningeal artery (pre-embolization) Right external (post-embolization) Right internal (final control) Right common femoral PROCEDURAL NARRATIVE A 5-Fr JB-1 glide catheter was advanced over a 0.035 glidewire into the aortic arch. The right internal and carotid arteries catheterized and cervical / cerebral angiograms taken. Exchange Glidewire placed into the mid cervical external carotid. The JB 1 glide catheter exchanged for 5 French straight Envoy guide catheter. Under roadmap guidance, the Apollo microcatheter introduced over microwire. The proximal intracranial middle meningeal artery selectively catheterized. Microcatheter run was taken. Catheter was then flushed with DMSO however during initial injection  of onyx, it appeared the catheter was clogged. I therefore removed this microcatheter. In a similar fashion, under roadmap guidance a second Apollo microcatheter was navigated into the proximal middle meningeal artery. After another microcatheter run, the right middle meningeal artery was embolized with onyx 18 after flushing the catheter DMSO. This was done under standard roadmap and blank roadmap. After embolization, the microcatheter was removed without incident. Angiogram was taken from the guide catheter in the external carotid artery. The catheter was then navigated into the internal carotid artery over Glidewire and final control angiogram was taken. Guide catheter was then removed. FINDINGS: Right internal carotid, head: Injection reveals the presence of a widely patent ICA, M1, and A1 segments and their branches. The parenchymal and venous phases are normal, however capillary phase does demonstrates some mass effect upon the right frontal lobe. The venous sinuses are widely patent. Right external carotid head  (pre embolization) Visualized cranial branches of the right external carotid artery are. The right middle meningeal artery is well visualized. There appears to be smaller accessory middle meningeal. No external to internal anastomoses are noted. Right middle meningeal (pre embolization) Microcatheter injection from the intracranial middle meningeal artery again reveals some perfusion of the dura. There is perhaps faint contrast blush in the region of the known subdural hematoma possibly representing perfusion of the subdural membrane. 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 The remainder of the visualized cranial branches of the external carotid artery are unremarkable. Right internal carotid, head (final control) The internal  carotid artery is widely patent with a normal bifurcation. The ACA and MCA branches are unremarkable. Capillary phase again demonstrates stable appearance of mild mass effect upon the frontal lobe. No perfusion deficits or branch occlusions are noted. Venous sinuses are patent. Right femoral: Normal vessel. No significant atherosclerotic disease. Arterial sheath in adequate position. DISPOSITION: Upon completion of the study, the femoral sheath was removed and hemostasis obtained by manual compression. Good proximal and distal lower extremity pulses were documented upon achievement of hemostasis. The procedure was well tolerated and no early complications were observed. The patient was transferred to the postanesthesia care unit in stable hemodynamic condition. IMPRESSION: 1. Successful onyx embolization of the right middle meningeal artery for treatment of chronic subdural hematoma The preliminary results of this procedure were shared with the patient's family. Electronically Signed   By: Consuella Lose   On: 11/30/2021 14:46   IR Angiogram Follow Up Study  Result Date: 11/30/2021 PROCEDURE: ONYX EMBOLIZATION OF RIGHT MIDDLE MENINGEAL ARTERY HISTORY: The patient is a 61 year old man initially presenting several weeks ago with small subdural hematoma. Serial scans have demonstrated progressive enlargement of the subdural hematoma although patient remains clinically well. He therefore was a good candidate middle meningeal embolization. ACCESS: The technical aspects of the procedure as well as its potential risks and benefits were reviewed with the patient and his family. These risks included but were not limited to stroke, intracranial hemorrhage, bleeding, infection, allergic reaction, damage to organs or vital structures, stroke, non-diagnostic procedure, and the catastrophic outcomes of heart attack, coma, and death. With an understanding of these risks, informed consent was obtained and witnessed. The  patient was placed in the supine position on the angiography table and the skin of right groin prepped in the usual sterile fashion. The procedure was performed under general anesthesia. A 6 French sheath was introduced in the right common femoral artery using Seldinger technique. MEDICATIONS: HEPARIN: 2000 Units total. CONTRAST:  77m OMNIPAQUE IOHEXOL 300 MG/ML  SOLNcc, Omnipaque 300 FLUOROSCOPY TIME:  FLUOROSCOPY TIME: See IR records TECHNIQUE: CATHETERS AND WIRES 5-French JB-1 catheter 180 cm 0.035" glidewire 280cm stiff glidewire 6-French Envoy MPC 5-French Envoy STR Apollo microcatheter x2 Synchro-10 microwire LIQUID EMBOLIC USED [id number]-40VESSELS CATHETERIZED Right internal carotid Right external Right middle meningeal artery Right common femoral VESSELS STUDIED Right internal carotid Right external (pre-embolization) Right middle meningeal artery (pre-embolization) Right external (post-embolization) Right internal (final control) Right common femoral PROCEDURAL NARRATIVE A 5-Fr JB-1 glide catheter was advanced over a 0.035 glidewire into the aortic arch. The right internal and carotid arteries catheterized and cervical / cerebral angiograms taken. Exchange Glidewire placed into the mid cervical external carotid. The JB 1 glide catheter exchanged for 5 French straight Envoy guide catheter. Under roadmap guidance, the Apollo microcatheter introduced over microwire. The proximal intracranial middle meningeal artery selectively catheterized. Microcatheter run was taken. Catheter was then flushed with DMSO however during initial injection of onyx, it appeared the catheter was clogged. I therefore removed this microcatheter. In a similar fashion, under roadmap guidance a second Apollo microcatheter was navigated into the proximal middle meningeal artery. After another microcatheter run, the right middle meningeal artery was embolized with onyx 18 after flushing the catheter DMSO. This was done under standard roadmap  and blank roadmap. After embolization, the microcatheter was removed without incident. Angiogram was taken from the guide catheter in the external carotid artery. The catheter was then navigated into the internal carotid artery over Glidewire and final control angiogram was taken. Guide catheter  was then removed. FINDINGS: Right internal carotid, head: Injection reveals the presence of a widely patent ICA, M1, and A1 segments and their branches. The parenchymal and venous phases are normal, however capillary phase does demonstrates some mass effect upon the right frontal lobe. The venous sinuses are widely patent. Right external carotid head  (pre embolization) Visualized cranial branches of the right external carotid artery are. The right middle meningeal artery is well visualized. There appears to be smaller accessory middle meningeal. No external to internal anastomoses are noted. Right middle meningeal (pre embolization) Microcatheter injection from the intracranial middle meningeal artery again reveals some perfusion of the dura. There is perhaps faint contrast blush in the region of the known subdural hematoma possibly representing perfusion of the subdural membrane. 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 The remainder of the visualized cranial branches of the external carotid artery are unremarkable. Right internal carotid, head (final control) The internal carotid artery is widely patent with a normal bifurcation. The ACA and MCA branches are unremarkable. Capillary phase again demonstrates stable appearance of mild mass effect upon the frontal lobe. No perfusion deficits or branch occlusions are noted. Venous sinuses are patent. Right femoral: Normal vessel. No significant atherosclerotic disease. Arterial sheath in adequate position.  DISPOSITION: Upon completion of the study, the femoral sheath was removed and hemostasis obtained by manual compression. Good proximal and distal lower extremity pulses were documented upon achievement of hemostasis. The procedure was well tolerated and no early complications were observed. The patient was transferred to the postanesthesia care unit in stable hemodynamic condition. IMPRESSION: 1. Successful onyx embolization of the right middle meningeal artery for treatment of chronic subdural hematoma The preliminary results of this procedure were shared with the patient's family. Electronically Signed   By: Consuella Lose   On: 11/30/2021 14:46   CT HEAD WO CONTRAST (5MM)  Result Date: 11/29/2021 CLINICAL DATA:  61 year old male with a right greater than left subdural hematoma last month. Headache, seizure like activity. EXAM: CT HEAD WITHOUT CONTRAST TECHNIQUE: Contiguous axial images were obtained from the base of the skull through the vertex without intravenous contrast. RADIATION DOSE REDUCTION: This exam was performed according to the departmental dose-optimization program which includes automated exposure control, adjustment of the mA and/or kV according to patient size and/or use of iterative reconstruction technique. COMPARISON:  Head CTs 11/24/2021 and earlier. FINDINGS: Brain: Mixed density right side subdural hematoma again appears slightly larger since 11/24/2021, now up to 14 mm thickness (series 4, image 35). Volume of hyperdense blood products not significantly changed. Still there is only trace leftward midline shift. Basilar cisterns remain patent. Trace left side subdural blood demonstrated on MRI last month remains occult by CT. No new areas of intracranial hemorrhage. Subtle mass effect on the right lateral ventricle. No ventriculomegaly. Stable gray-white matter differentiation throughout the brain. No cortically based acute infarct identified. Vascular: Extensive Calcified  atherosclerosis at the skull base. No suspicious intracranial vascular hyperdensity. Skull: Stable.  No acute osseous abnormality identified. Sinuses/Orbits: Chronic right mastoid sclerosis. Visualized paranasal sinuses and mastoids are stable and well aerated. Other: No acute orbit or scalp soft tissue finding. Calcified scalp vessel atherosclerosis. IMPRESSION: 1. Slowly enlarging mixed density Right Subdural Hematoma, now slightly larger since 11/24/2021, up to 14 mm thickness. But still only trace leftward midline shift, and no other significant intracranial mass effect.  2. Trace left side subdural blood demonstrated on MRI last month remains occult by CT. No new intracranial abnormality. Electronically Signed   By: Genevie Ann M.D.   On: 11/29/2021 07:26   EEG adult  Result Date: 11/24/2021 [name], MD     11/24/2021 11:38 AM Patient Name: Kirk [name] MRN: [id number] Epilepsy Attending: [name] Referring Physician/Provider: Kerney Elbe, MD Date: 11/24/2021 Duration: 24.37 mins Patient history: 61 year old dialysis patient with acute right SDH with new onset seizure presenting on 10/17. Patient with new onset left-sided neglect and right forced gaze on 10/24.  EEG to evaluate for seizure Level of alertness: Awake, asleep AEDs during EEG study: LEV, PGB Technical aspects: This EEG study was done with scalp electrodes positioned according to the 10-20 International system of electrode placement. Electrical activity was reviewed with band pass filter of 1-'70Hz'$ , sensitivity of 7 uV/mm, display speed of 58m/sec with a '60Hz'$  notched filter applied as appropriate. EEG data were recorded continuously and digitally stored.  Video monitoring was available and reviewed as appropriate. Description: The posterior dominant rhythm consists of 8 Hz activity of moderate voltage (25-35 uV) seen predominantly in posterior head regions, symmetric and reactive to eye opening and eye closing. Sleep was  characterized by vertex waves, sleep spindles (12 to 14 Hz), maximal frontocentral region. EEG showed near continuous 3 to 6 Hz theta-delta slowing in right parieto-occipital region. Hyperventilation and photic stimulation were not performed.   ABNORMALITY - Continuous slow, right parieto-occipital region IMPRESSION: This study is suggestive of cortical dysfunction arising from right parieto-occipital region likely secondary to underlying SDH. No seizures or definite epileptiform discharges were seen throughout the recording. [name]  CT HEAD WO CONTRAST (5MM)  Result Date: 11/24/2021 CLINICAL DATA:  Headache history of subdural hematoma. EXAM: CT HEAD WITHOUT CONTRAST TECHNIQUE: Contiguous axial images were obtained from the base of the skull through the vertex without intravenous contrast. RADIATION DOSE REDUCTION: This exam was performed according to the departmental dose-optimization program which includes automated exposure control, adjustment of the mA and/or kV according to patient size and/or use of iterative reconstruction technique. COMPARISON:  Brain MRI 11/16/2021, CT head 11/14/2021 FINDINGS: Brain: The mixed density but primarily hypodense right cerebral convexity subdural hematoma measures up to 1.3 cm in maximal thickness in the coronal plane (5-44), increased in size compared to the head CT from 11/14/2021 when it measured up to 1.0 cm. There is likely trace new/acute blood within the collection (5-34). There is mass effect on the underlying brain parenchyma with sulcal effacement, partial effacement of the right lateral ventricle, and trace leftward midline shift. The smaller left-sided subdural seen on the prior MRI is not seen on the current study. There is no other acute intracranial hemorrhage. There is no acute territorial infarct. Background parenchymal volume is normal. The ventricles are otherwise normal in size. Gray-white differentiation is preserved. There is no solid mass  lesion. Vascular: No hyperdense vessel or unexpected calcification. Skull: Normal. Negative for fracture or focal lesion. Sinuses/Orbits: The paranasal sinuses are clear. The globes and orbits are unremarkable. Other: None. IMPRESSION: Slight interval increase in size of the mixed density subacute right cerebral convexity subdural hematoma now measuring up to 1.3 cm (previously measured 1.0 cm), with trace new/acute blood and mild mass effect and trace leftward midline shift. Electronically Signed   By: PValetta MoleM.D.   On: 11/24/2021 09:00   ECHOCARDIOGRAM COMPLETE  Result Date: 11/16/2021    ECHOCARDIOGRAM REPORT  Patient Name:   Kirk [name] Date of Exam: 11/16/2021 Medical Rec #:  [id number]       Height:       67.0 in Accession #:    [id number]      Weight:       200.0 lb Date of Birth:  1960-05-14        BSA:          2.022 m Patient Age:    46 years        BP:           175/70 mmHg Patient Gender: M               HR:           61 bpm. Exam Location:  Inpatient Procedure: 2D Echo, Color Doppler and Cardiac Doppler Indications:    I48.91* Unspecified atrial fibrillation  History:        Patient has prior history of Echocardiogram examinations, most                 recent 09/08/2020. Risk Factors:Hypertension, Diabetes,                 Dyslipidemia and Sleep Apnea.  Sonographer:    Raquel Sarna Senior RDCS Referring Phys: [place]  1. Left ventricular ejection fraction, by estimation, is 50 to 55%. The left ventricle has low normal function. The left ventricle has no regional wall motion abnormalities. Left ventricular diastolic parameters are consistent with Grade I diastolic dysfunction (impaired relaxation).  2. Right ventricular systolic function is normal. The right ventricular size is normal. Tricuspid regurgitation signal is inadequate for assessing PA pressure.  3. Left atrial size was mildly dilated.  4. The mitral valve is normal in structure. Trivial mitral valve  regurgitation. No evidence of mitral stenosis.  5. The aortic valve is normal in structure. Aortic valve regurgitation is not visualized. Mild aortic valve stenosis. Aortic valve area, by VTI measures 1.69 cm. Aortic valve mean gradient measures 8.0 mmHg. Aortic valve Vmax measures 1.92 m/s.  6. The inferior vena cava is normal in size with greater than 50% respiratory variability, suggesting right atrial pressure of 3 mmHg. FINDINGS  Left Ventricle: Left ventricular ejection fraction, by estimation, is 50 to 55%. The left ventricle has low normal function. The left ventricle has no regional wall motion abnormalities. The left ventricular internal cavity size was normal in size. There is no left ventricular hypertrophy. Left ventricular diastolic parameters are consistent with Grade I diastolic dysfunction (impaired relaxation). Right Ventricle: The right ventricular size is normal. No increase in right ventricular wall thickness. Right ventricular systolic function is normal. Tricuspid regurgitation signal is inadequate for assessing PA pressure. Left Atrium: Left atrial size was mildly dilated. Right Atrium: Right atrial size was normal in size. Pericardium: There is no evidence of pericardial effusion. Presence of epicardial fat layer. Mitral Valve: The mitral valve is normal in structure. Trivial mitral valve regurgitation. No evidence of mitral valve stenosis. Tricuspid Valve: The tricuspid valve is normal in structure. Tricuspid valve regurgitation is not demonstrated. No evidence of tricuspid stenosis. Aortic Valve: The aortic valve is normal in structure. Aortic valve regurgitation is not visualized. Mild aortic stenosis is present. Aortic valve mean gradient measures 8.0 mmHg. Aortic valve peak gradient measures 14.7 mmHg. Aortic valve area, by VTI measures 1.69 cm. Pulmonic Valve: The pulmonic valve was normal in structure. Pulmonic valve regurgitation is not visualized. No evidence of pulmonic stenosis.  Aorta:  The aortic root is normal in size and structure. Venous: The inferior vena cava is normal in size with greater than 50% respiratory variability, suggesting right atrial pressure of 3 mmHg. IAS/Shunts: No atrial level shunt detected by color flow Doppler.  LEFT VENTRICLE PLAX 2D LVIDd:         5.70 cm   Diastology LVIDs:         4.10 cm   LV e' medial:    6.42 cm/s LV PW:         1.20 cm   LV E/e' medial:  19.0 LV IVS:        0.90 cm   LV e' lateral:   7.07 cm/s LVOT diam:     2.20 cm   LV E/e' lateral: 17.3 LV SV:         76 LV SV Index:   38 LVOT Area:     3.80 cm  RIGHT VENTRICLE RV S prime:     13.40 cm/s TAPSE (M-mode): 2.4 cm LEFT ATRIUM             Index        RIGHT ATRIUM           Index LA diam:        4.40 cm 2.18 cm/m   RA Area:     18.30 cm LA Vol (A2C):   72.9 ml 36.03 ml/m  RA Volume:   50.20 ml  24.83 ml/m LA Vol (A4C):   63.6 ml 31.45 ml/m LA Biplane Vol: 69.9 ml 34.57 ml/m  AORTIC VALVE AV Area (Vmax):    1.69 cm AV Area (Vmean):   1.89 cm AV Area (VTI):     1.69 cm AV Vmax:           192.00 cm/s AV Vmean:          133.000 cm/s AV VTI:            0.452 m AV Peak Grad:      14.7 mmHg AV Mean Grad:      8.0 mmHg LVOT Vmax:         85.40 cm/s LVOT Vmean:        66.000 cm/s LVOT VTI:          0.201 m LVOT/AV VTI ratio: 0.44  AORTA Ao Root diam: 3.10 cm Ao Asc diam:  3.50 cm MITRAL VALVE MV Area (PHT): 3.61 cm     SHUNTS MV Decel Time: 210 msec     Systemic VTI:  0.20 m MV E velocity: 122.00 cm/s  Systemic Diam: 2.20 cm MV A velocity: 95.40 cm/s MV E/A ratio:  1.28 Kardie Tobb DO Electronically signed by Berniece Salines DO Signature Date/Time: 11/16/2021/4:05:41 PM    Final    MR BRAIN WO CONTRAST  Result Date: 11/16/2021 CLINICAL DATA:  Seizure EXAM: MRI HEAD WITHOUT CONTRAST TECHNIQUE: Multiplanar, multiecho pulse sequences of the brain and surrounding structures were obtained without intravenous contrast. COMPARISON:  No prior MRI, correlation is made with CT head 11/14/2021  FINDINGS: Brain: No restricted diffusion to suggest acute or subacute infarct. Redemonstrated subdural collection along the right cerebral convexity, which measures up to 10 mm, similar to the prior CT. Fluid-fluid levels within the collection, with loculations, consistent with acute on chronic subdural hematoma. This causes some sulcal effacement along the right cerebral hemisphere, with 2 mm right-to-left midline shift. Mild mass effect on the right lateral ventricle. No hydrocephalus. Additional smaller collections along the left cerebral convexity,  primarily about the left temporal and occipital lobe, measuring up to 2 mm (series 11, image 11), along the right tentorial leaflet, measuring 2 mm (series 25, image 7), along the right inferior temporal lobe (series 25, image 28), measuring approximately 1 mm, and along the medial aspect of the right temporal lobe, measuring 5 mm (series 25, image 33). No acute parenchymal hemorrhage or mass. Scattered T2 hyperintense signal in the periventricular white matter, likely the sequela of mild chronic small vessel ischemic disease. Evaluation is somewhat limited by motion artifact. Within this limitation, the hippocampi are symmetric in size and signal. No heterotopia or evidence of cortical dysgenesis. Vascular: Normal flow voids. Skull and upper cervical spine: Normal marrow signal. Sinuses/Orbits: No acute finding. Other: The mastoids are well aerated. IMPRESSION: 1. Redemonstrated acute on chronic subdural hematoma along the right cerebral convexity, measuring up to 10 mm, with some sulcal effacement and 2 mm right-to-left midline shift. 2. Additional smaller subdural collections along the left cerebral convexity, along the right tentorial leaflet, and along the right inferior temporal lobe, measuring up to 5 mm. 3. No additional acute intracranial process. No seizure etiology identified. Electronically Signed   By: Merilyn Baba M.D.   On: 11/16/2021 02:58    Overnight EEG with video  Result Date: 11/15/2021 [name], MD     11/15/2021  9:35 AM Patient Name: Kirk [name] MRN: [id number] Epilepsy Attending: [name] Referring Physician/Provider: Rikki Spearing, NP Duration: 11/14/2021 1034 to 11/15/2021 0758  Patient history: 61yo M with new onset seizures. EEG to evaluate for seizure.  Level of alertness: Awake, asleep  AEDs during EEG study: LEV  Technical aspects: This EEG study was done with scalp electrodes positioned according to the 10-20 International system of electrode placement. Electrical activity was reviewed with band pass filter of 1-'70Hz'$ , sensitivity of 7 uV/mm, display speed of 37m/sec with a '60Hz'$  notched filter applied as appropriate. EEG data were recorded continuously and digitally stored.  Video monitoring was available and reviewed as appropriate.  Description: At the beginning of the study, EEG showed continuous generalized and lateralized right hemisphere 3-6 theta-delta slowing.  Gradually, EEG improved and showed  posterior dominant rhythm of 8-'9Hz'$  activity of moderate voltage (25-35 uV) seen predominantly in posterior head regions, symmetric and reactive to eye opening and eye closing. Sleep was characterized by vertex waves, sleep spindles (12 to 14 Hz), maximal frontocentral region.  EEG also showed intermittent generalized and lateralized right hemisphere 3 to 6 Hz theta-delta slowing. Hyperventilation and photic stimulation were not performed.    ABNORMALITY - Continuous slow, generalized and lateralized right hemisphere  IMPRESSION: This study was initially suggestive of cortical dysfunction arising from right hemisphere likely secondary to postictal state as well as moderate to severe diffuse encephalopathy.  Gradually, EEG improved and was suggestive of mild to moderate diffuse encephalopathy, nonspecific etiology.  No seizures or epileptiform discharges were seen throughout the recording.  Please note lack  of epileptiform activity does not exclude the diagnosis of epilepsy.   PLora Havens   DG CHEST PORT 1 VIEW  Result Date: 11/15/2021 CLINICAL DATA:  Sepsis. EXAM: PORTABLE CHEST 1 VIEW COMPARISON:  08/01/2021. FINDINGS: The heart is enlarged and the mediastinal contour is stable. The pulmonary vasculature is distended. Interstitial prominence is noted bilaterally. No consolidation, effusion, or pneumothorax. Cervical spinal fusion hardware is noted. No acute osseous abnormality. IMPRESSION: 1. Cardiomegaly with pulmonary vascular congestion. 2. Interstitial prominence bilaterally, possible edema or infiltrate. Electronically Signed  By: Brett Fairy M.D.   On: 11/15/2021 01:45   CT ANGIO HEAD NECK W WO CM W PERF (CODE STROKE)  Result Date: 11/14/2021 CLINICAL DATA:  61 year old male code stroke presentation this morning. Recent right side subdural hematoma and seizure like activity. EXAM: CT ANGIOGRAPHY HEAD AND NECK CT PERFUSION BRAIN TECHNIQUE: Multidetector CT imaging of the head and neck was performed using the standard protocol during bolus administration of intravenous contrast. Multiplanar CT image reconstructions and MIPs were obtained to evaluate the vascular anatomy. Carotid stenosis measurements (when applicable) are obtained utilizing NASCET criteria, using the distal internal carotid diameter as the denominator. Multiphase CT imaging of the brain was performed following IV bolus contrast injection. Subsequent parametric perfusion maps were calculated using RAPID software. RADIATION DOSE REDUCTION: This exam was performed according to the departmental dose-optimization program which includes automated exposure control, adjustment of the mA and/or kV according to patient size and/or use of iterative reconstruction technique. CONTRAST:  124m OMNIPAQUE IOHEXOL 350 MG/ML SOLN COMPARISON:  Head CT, 0851 hours today and earlier. Chest CT 08/01/2021 FINDINGS: CT Brain Perfusion Findings: CBF  (<30%) Volume: 5 mL, but appears to be extra-axial artifact associated with the known SDH. Perfusion (Tmax>6.0s) volume: 050m no T-max parameter abnormality Mismatch Volume: Not applicable Infarction Location: Not applicable. CTA NECK Skeleton: Absent dentition. Previous lower cervical spine fusion. Cervical spine degeneration elsewhere. No acute osseous abnormality identified. Upper chest: Respiratory motion. Scattered mostly sub solid bilateral peribronchial nodular opacities. These are new from the July chest CT and appear inflammatory. No pleural effusion. No superior mediastinal lymphadenopathy. Other neck: Small volume retained secretions in the pharynx. Otherwise negative. Aortic arch: Calcified aortic atherosclerosis. 3 vessel arch configuration. Right carotid system: Negative brachiocephalic artery. Mild motion artifact and plaque at the right CCA origin without stenosis. Intermittent plaque before the bifurcation, including combined soft and calcified plaque series 5, image 119, but no stenosis. Mostly calcified plaque at the bifurcation, right ICA origin and bulb without stenosis. Moderate additional calcified plaque of the right ICA at the skull base with up to 50 % stenosis with respect to the distal vessel (series 5, image 72). Left carotid system: Mild left CCA plaque without stenosis. Calcified plaque at the left ICA origin and bulb with up to 50 % stenosis with respect to the distal vessel. Vertebral arteries: Right subclavian artery origin motion artifact and calcified plaque with estimated 70 % stenosis with respect to the distal vessel. See series 9, image 117. But the right vertebral artery origin is normal. Right vertebral artery is patent to the skull base with no significant plaque or stenosis. Contralateral moderate proximal left subclavian artery calcified plaque with no significant stenosis. Normal left vertebral artery origin. Codominant left vertebral artery is patent to the skull base  with no significant plaque or stenosis. CTA HEAD Posterior circulation: Left vertebral V4 segment is normal to the vertebrobasilar junction. Right vertebral V4 segment with tandem severe irregularity and tandem stenoses of 3-4 mm in length leading up to the vertebrobasilar junction. See series 12, images 24 and 23. Despite this the right V4 segment remains patent. Patent basilar artery with no significant stenosis. Normal SCA and PCA origins. Bilateral AICA origins are patent and may be dominant. Bilateral PCA branches are within normal limits. Anterior circulation: Both ICA siphons are patent with mild to moderate calcified plaque but only mild bilateral siphon stenosis. Patent carotid termini, MCA and ACA origins. Diminutive or absent anterior communicating artery. Bilateral ACA branches are within normal limits.  Left MCA M1 segment and bifurcation are patent without stenosis. Left MCA branches are within normal limits. Right MCA M1 segment and bifurcation are patent without stenosis. Right MCA branches remain within normal limits, mild branch mass effect related to known broad-based right side subdural hematoma Venous sinuses: Patent. Right SDH displaces the cortical veins away from the inner table of the right skull as expected. No active extravasation into the right subdural hematoma is identified. Anatomic variants: None. Review of the MIP images confirms the above findings IMPRESSION: 1. Negative for large vessel occlusion, and negative CT Perfusion. 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 And other atherosclerosis in the neck with estimated 70% stenosis of the Right Subclavian Artery origin, up to 50% stenosis of the cervical ICAs. 2. Bilateral upper lobe multifocal peribronchial nodular opacities are new since a July chest CT, nonspecific but suspicious for Acute bronchopneumonia. 3. Known Right Side Subdural Hematoma with mild  mass effect on the Right MCA branches. 4.  Aortic Atherosclerosis (ICD10-I70.0). Electronically Signed   By: Genevie Ann M.D.   On: 11/14/2021 09:34   CT HEAD CODE STROKE WO CONTRAST  Result Date: 11/14/2021 CLINICAL DATA:  Code stroke.  Right-sided gaze, slurred speech. EXAM: CT HEAD WITHOUT CONTRAST TECHNIQUE: Contiguous axial images were obtained from the base of the skull through the vertex without intravenous contrast. RADIATION DOSE REDUCTION: This exam was performed according to the departmental dose-optimization program which includes automated exposure control, adjustment of the mA and/or kV according to patient size and/or use of iterative reconstruction technique. COMPARISON:  CT head 11/12/2019 FINDINGS: Brain: Again seen is the right cerebral convexity subdural hematoma measuring up to 1.0 cm in maximal thickness in the coronal plane (5-34), not significantly changed in maximal size but with new hyperdense blood layering dependently within the hematoma suggesting interval bleed. A small amount of subdural blood is also seen layering along the right tentorial leaflet, unchanged. The hematoma exerts mass effect on the underlying brain parenchyma with sulcal effacement and partial effacement of the right lateral ventricle but no midline shift. There is no other acute intracranial hemorrhage. There is no evidence of acute territorial infarct. Gray-white differentiation is preserved. Ventricles are stable in size.  There is no solid mass lesion. Vascular: No hyperdense vessel is seen. Skull: Normal. Negative for fracture or focal lesion. Sinuses/Orbits: The paranasal sinuses are clear. The globes and orbits are unremarkable. Other: None. IMPRESSION: 1. Similar size of the right cerebral convexity subdural hematoma measuring up to 1.0 cm in maximal thickness, but with new hyperdense blood layering dependently suggesting interval bleed. Mild mass effect on the underlying brain parenchyma with no midline  shift. 2. No evidence of acute territorial infarct. Findings of the initial noncontrast head CT were discussed with Dr. Quinn Axe at 8:55 a.m. Electronically Signed   By: Valetta Mole M.D.   On: 11/14/2021 09:01   CT HEAD WO CONTRAST (5MM)  Result Date: 11/11/2021 CLINICAL DATA:  Follow-up subdural hematoma EXAM: CT HEAD WITHOUT CONTRAST TECHNIQUE: Contiguous axial images were obtained from the base of the skull through the vertex without intravenous contrast. RADIATION DOSE REDUCTION: This exam was performed according to the departmental dose-optimization program which includes automated exposure control, adjustment of the mA and/or kV according to patient size and/or use of iterative reconstruction technique. COMPARISON:  Head CT from 3 days ago FINDINGS: Brain: Mixed density subdural hematoma along the right cerebral convexity, maximal thickness along the anterior  right frontal convexity measuring up to 8 mm. Local cortical mass effect is mild. High-density subdural clot along the right tentorial leaflet is new, possibly redistribution. No hydrocephalus, infarct, or masslike finding Vascular: Arterial calcifications correlating with end-stage renal disease Skull: Normal. Negative for fracture or focal lesion. Sinuses/Orbits: Negative IMPRESSION: Trace increase in subdural hemorrhage along the right tentorium, but unchanged maximal thickness along the right frontal convexity at 8 mm. Electronically Signed   By: Jorje Guild M.D.   On: 11/11/2021 11:44   EEG adult  Result Date: 11/09/2021 [name], MD     11/09/2021  3:18 PM Patient Name: Kirk [name] MRN: [id number] Epilepsy Attending: [name] Referring Physician/Provider: August Albino, NP Date:  11/09/2021 Duration: 23.38 mins Patient history: 61yo M with new onset seizures. EEG to evaluate for seizure. Level of alertness: Awake, asleep AEDs during EEG study: LEV Technical aspects: This EEG study was done with scalp electrodes  positioned according to the 10-20 International system of electrode placement. Electrical activity was reviewed with band pass filter of 1-'70Hz'$ , sensitivity of 7 uV/mm, display speed of 61m/sec with a '60Hz'$  notched filter applied as appropriate. EEG data were recorded continuously and digitally stored.  Video monitoring was available and reviewed as appropriate. Description: The posterior dominant rhythm consists of 8-'9Hz'$  activity of moderate voltage (25-35 uV) seen predominantly in posterior head regions, symmetric and reactive to eye opening and eye closing. Sleep was characterized by vertex waves, sleep spindles (12 to 14 Hz), maximal frontocentral region.  EEG showed intermittent 3 to 6 Hz theta-delta slowing in right temporal region. Hyperventilation and photic stimulation were not performed.   ABNORMALITY - Intermittent slow, right temporal region IMPRESSION: This study is suggestive of cortical dysfunction arising from right temporal region likely secondary to underlying SDH. No seizures or epileptiform discharges were seen throughout the recording. Please note lack of epileptiform activity does not exclude the diagnosis of epilepsy. [name]  CT HEAD WO CONTRAST (5MM)  Result Date: 11/08/2021 CLINICAL DATA:  Follow-up subdural hematoma EXAM: CT HEAD WITHOUT CONTRAST TECHNIQUE: Contiguous axial images were obtained from the base of the skull through the vertex without intravenous contrast. RADIATION DOSE REDUCTION: This exam was performed according to the departmental dose-optimization program which includes automated exposure control, adjustment of the mA and/or kV according to patient size and/or use of iterative reconstruction technique. COMPARISON:  Yesterday FINDINGS: Brain: Mixed density subdural hematoma along the right cerebral convexity, primarily lateral but also minimally seen inferiorly. In the anterior frontal region maximal thickness is 9 mm. No progression when compared on coronal  reformats. Mild right cerebral hemispheric mass effect. No infarct, hydrocephalus, or masslike finding. Vascular: No hyperdense vessel or unexpected calcification. Skull: Normal. Negative for fracture or focal lesion. Sinuses/Orbits: No acute finding. IMPRESSION: Unchanged subdural hematoma along the right cerebral convexity measuring up to 9 mm at the anterior frontal lobe. Electronically Signed   By: JJorje GuildM.D.   On: 11/08/2021 10:12   CT Head Wo Contrast  Result Date: 11/07/2021 CLINICAL DATA:  Headache; seizure-like activity EXAM: CT HEAD WITHOUT CONTRAST TECHNIQUE: Contiguous axial images were obtained from the base of the skull through the vertex without intravenous contrast. RADIATION DOSE REDUCTION: This exam was performed according to the departmental dose-optimization program which includes automated exposure control, adjustment of the mA and/or kV according to patient size and/or use of iterative reconstruction technique. COMPARISON:  None Available. FINDINGS: Brain: Subdural hematoma overlying the right frontal convexity measuring up  to 9 mm in greatest radial diameter. Adjacent mass effect on the right frontal lobe with mild loss of sulcation. 1-2 mm of leftward midline shift at the septum pellucidum. Basal cisterns are patent. Normal caliber of the ventricles. No evidence of acute infarct. Mild generalized cerebral atrophy. Mild chronic microvascular ischemic disease. Vascular: No hyperdense vessel or unexpected calcification. Skull: Normal. Negative for fracture or focal lesion. Sinuses/Orbits: No acute finding. Other: None. IMPRESSION: Acute subdural hematoma overlying the right frontal convexity measuring 9 mm in greatest radial diameter. Mild adjacent mass effect and 1-2 mm of leftward midline shift at the septum pellucidum. These results were called by telephone at the time of interpretation on 11/07/2021 at 10:00 pm to provider [facility] , who verbally acknowledged these  results. Electronically Signed   By: Placido Sou M.D.   On: 11/07/2021 22:02    Assessment/Plan 1. Benign hypertension with ESRD (end-stage renal disease) (HCC) B/p slightly high today has upcoming dialysis tomorrow - continue to monitor B/p at home  - continue on losartan,amlodipine ,imdur,hydralazine  - CBC with Differential/Platelet - Hepatic function panel  2. Mixed hyperlipidemia LDL at goal  - continue on heart healthy diet   3. Gastroesophageal reflux disease without esophagitis Symptoms controlled   4. Seizure [facility]) S/p hospitalization due to cerebral aneurysm and subdural Hematoma  No seizure activity since discharge  - aware not to drive until cleared by Neuro  - Has upcoming appointment with Neurologist 12/20/2021  - continue on Keppra  - Levetiracetam level  5. Type 2 diabetes mellitus with diabetic nephropathy, without long-term current use of insulin (HCC) Latest A1C at goal  - diet controlled  - continue Lyrical for neuropathy  - pregabalin (LYRICA) 75 MG capsule; Take 1 capsule (75 mg total) by mouth daily.  Dispense: 30 capsule; Refill: 3  6. Tension headache No headache during visit  Continue on lyrica   7. Dysuria Afebrile  Unable to provide specimen during visit voids occasionally due to Hemodialysis.Has had similar symptoms of UTI in the past responded well to Cipro.will treat clinically - ciprofloxacin (CIPRO) 500 MG tablet; Take 1 tablet (500 mg total) by mouth daily with breakfast for 7 days.  Dispense: 7 tablet; Refill: 0  Family/ staff Communication: Reviewed plan of care with patient and wife verbalized understanding   Labs/tests ordered:  - CBC with Differential/Platelet - Hepatic panel  Keppra level   Next Appointment: Return if symptoms worsen or fail to improve.   Sandrea Hughs, NP

## 2021-12-08 DIAGNOSIS — D631 Anemia in chronic kidney disease: Secondary | ICD-10-CM | POA: Diagnosis not present

## 2021-12-08 DIAGNOSIS — N186 End stage renal disease: Secondary | ICD-10-CM | POA: Diagnosis not present

## 2021-12-08 DIAGNOSIS — N2581 Secondary hyperparathyroidism of renal origin: Secondary | ICD-10-CM | POA: Diagnosis not present

## 2021-12-08 DIAGNOSIS — Z992 Dependence on renal dialysis: Secondary | ICD-10-CM | POA: Diagnosis not present

## 2021-12-08 DIAGNOSIS — D509 Iron deficiency anemia, unspecified: Secondary | ICD-10-CM | POA: Diagnosis not present

## 2021-12-10 DIAGNOSIS — Z992 Dependence on renal dialysis: Secondary | ICD-10-CM | POA: Diagnosis not present

## 2021-12-10 DIAGNOSIS — N186 End stage renal disease: Secondary | ICD-10-CM | POA: Diagnosis not present

## 2021-12-10 DIAGNOSIS — D509 Iron deficiency anemia, unspecified: Secondary | ICD-10-CM | POA: Diagnosis not present

## 2021-12-10 DIAGNOSIS — D631 Anemia in chronic kidney disease: Secondary | ICD-10-CM | POA: Diagnosis not present

## 2021-12-10 DIAGNOSIS — N2581 Secondary hyperparathyroidism of renal origin: Secondary | ICD-10-CM | POA: Diagnosis not present

## 2021-12-12 ENCOUNTER — Ambulatory Visit: Payer: Medicare Other | Admitting: Physical Therapy

## 2021-12-12 ENCOUNTER — Ambulatory Visit: Payer: Medicare Other | Admitting: Occupational Therapy

## 2021-12-12 DIAGNOSIS — Z992 Dependence on renal dialysis: Secondary | ICD-10-CM | POA: Diagnosis not present

## 2021-12-12 DIAGNOSIS — N2581 Secondary hyperparathyroidism of renal origin: Secondary | ICD-10-CM | POA: Diagnosis not present

## 2021-12-12 DIAGNOSIS — D631 Anemia in chronic kidney disease: Secondary | ICD-10-CM | POA: Diagnosis not present

## 2021-12-12 DIAGNOSIS — N186 End stage renal disease: Secondary | ICD-10-CM | POA: Diagnosis not present

## 2021-12-12 DIAGNOSIS — D509 Iron deficiency anemia, unspecified: Secondary | ICD-10-CM | POA: Diagnosis not present

## 2021-12-15 DIAGNOSIS — D509 Iron deficiency anemia, unspecified: Secondary | ICD-10-CM | POA: Diagnosis not present

## 2021-12-15 DIAGNOSIS — N186 End stage renal disease: Secondary | ICD-10-CM | POA: Diagnosis not present

## 2021-12-15 DIAGNOSIS — D631 Anemia in chronic kidney disease: Secondary | ICD-10-CM | POA: Diagnosis not present

## 2021-12-15 DIAGNOSIS — Z992 Dependence on renal dialysis: Secondary | ICD-10-CM | POA: Diagnosis not present

## 2021-12-15 DIAGNOSIS — N2581 Secondary hyperparathyroidism of renal origin: Secondary | ICD-10-CM | POA: Diagnosis not present

## 2021-12-18 DIAGNOSIS — Z992 Dependence on renal dialysis: Secondary | ICD-10-CM | POA: Diagnosis not present

## 2021-12-18 DIAGNOSIS — N186 End stage renal disease: Secondary | ICD-10-CM | POA: Diagnosis not present

## 2021-12-18 DIAGNOSIS — D509 Iron deficiency anemia, unspecified: Secondary | ICD-10-CM | POA: Diagnosis not present

## 2021-12-18 DIAGNOSIS — N2581 Secondary hyperparathyroidism of renal origin: Secondary | ICD-10-CM | POA: Diagnosis not present

## 2021-12-18 DIAGNOSIS — D631 Anemia in chronic kidney disease: Secondary | ICD-10-CM | POA: Diagnosis not present

## 2021-12-19 ENCOUNTER — Ambulatory Visit: Payer: Medicare Other | Admitting: Occupational Therapy

## 2021-12-19 ENCOUNTER — Ambulatory Visit: Payer: Medicare Other

## 2021-12-20 DIAGNOSIS — Z992 Dependence on renal dialysis: Secondary | ICD-10-CM | POA: Diagnosis not present

## 2021-12-20 DIAGNOSIS — Z6831 Body mass index (BMI) 31.0-31.9, adult: Secondary | ICD-10-CM | POA: Diagnosis not present

## 2021-12-20 DIAGNOSIS — N186 End stage renal disease: Secondary | ICD-10-CM | POA: Diagnosis not present

## 2021-12-20 DIAGNOSIS — N2581 Secondary hyperparathyroidism of renal origin: Secondary | ICD-10-CM | POA: Diagnosis not present

## 2021-12-20 DIAGNOSIS — S065XAA Traumatic subdural hemorrhage with loss of consciousness status unknown, initial encounter: Secondary | ICD-10-CM | POA: Diagnosis not present

## 2021-12-20 DIAGNOSIS — D631 Anemia in chronic kidney disease: Secondary | ICD-10-CM | POA: Diagnosis not present

## 2021-12-20 DIAGNOSIS — D509 Iron deficiency anemia, unspecified: Secondary | ICD-10-CM | POA: Diagnosis not present

## 2021-12-21 ENCOUNTER — Ambulatory Visit: Payer: Medicare Other | Attending: Neurosurgery

## 2021-12-21 ENCOUNTER — Other Ambulatory Visit: Payer: Self-pay

## 2021-12-21 ENCOUNTER — Ambulatory Visit: Payer: Medicare Other | Admitting: Occupational Therapy

## 2021-12-21 DIAGNOSIS — R41842 Visuospatial deficit: Secondary | ICD-10-CM | POA: Diagnosis not present

## 2021-12-21 DIAGNOSIS — I671 Cerebral aneurysm, nonruptured: Secondary | ICD-10-CM | POA: Diagnosis not present

## 2021-12-21 DIAGNOSIS — R2689 Other abnormalities of gait and mobility: Secondary | ICD-10-CM | POA: Diagnosis not present

## 2021-12-21 DIAGNOSIS — M6281 Muscle weakness (generalized): Secondary | ICD-10-CM | POA: Diagnosis not present

## 2021-12-21 DIAGNOSIS — R2681 Unsteadiness on feet: Secondary | ICD-10-CM

## 2021-12-21 DIAGNOSIS — R262 Difficulty in walking, not elsewhere classified: Secondary | ICD-10-CM | POA: Diagnosis not present

## 2021-12-21 DIAGNOSIS — S065X1A Traumatic subdural hemorrhage with loss of consciousness of 30 minutes or less, initial encounter: Secondary | ICD-10-CM | POA: Insufficient documentation

## 2021-12-21 DIAGNOSIS — R29898 Other symptoms and signs involving the musculoskeletal system: Secondary | ICD-10-CM

## 2021-12-21 NOTE — Therapy (Signed)
OUTPATIENT PHYSICAL THERAPY NEURO EVALUATION   Patient Name: Kirk [name] MRN: [id number] DOB:Jun 13, 1960, 61 y.o., male Today's Date: 12/21/2021   PCP: Sandrea Hughs, NP REFERRING PROVIDER: Consuella Lose, MD  END OF SESSION:  PT End of Session - 12/21/21 1524     Visit Number 1    Number of Visits 12    Date for PT Re-Evaluation 02/15/22    Authorization Type Medicare A&B    Progress Note Due on Visit 10    PT Start Time 1530    PT Stop Time 3790    PT Time Calculation (min) 45 min             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)    Anxiety    Asthma    Asthma    moderate persistent   Carpal tunnel syndrome    Cellulitis and abscess of trunk    Cholelithiasis 07/13/2014   Chronic headaches    Debility, unspecified    Dermatophytosis of the body    Dysrhythmia    history of   Edema    End stage renal disease on dialysis ([place])    "MWF; [facility] in [place]" (10/21/2014)   Essential hypertension, benign    GERD (gastroesophageal reflux disease)    Gout, unspecified    HTN (hypertension)    Hypertrophy of prostate without urinary obstruction and other lower urinary tract symptoms (LUTS)    Hypotension, unspecified    Impotence of organic origin    Insomnia, unspecified    Kidney replaced by transplant    Localization-related (focal) (partial) epilepsy and epileptic syndromes with complex partial seizures, without mention of intractable epilepsy    12-15-19- Wife states he has NEVER had a seizure    Lumbago    Memory loss    OSA on CPAP    Other and unspecified hyperlipidemia    controlled /managed per wife    Other chronic nonalcoholic liver disease    Other malaise and fatigue    Other nonspecific abnormal serum enzyme levels    Pain in joint, lower leg    Pain in joint, upper arm     Pneumonia "several times"   PONV (postoperative nausea and vomiting)    Renal dialysis status(V45.11) 02/05/2010   restarted 01/02/13 ofter renal trransplant failure   Secondary hyperparathyroidism (of renal origin)    Shortness of breath    Sleep apnea    wears cpap    Tension headache    Unspecified constipation    Unspecified essential hypertension    Unspecified hereditary and idiopathic peripheral neuropathy    Unspecified vitamin D deficiency    Past Surgical History:  Procedure Laterality Date   AV FISTULA PLACEMENT Left ?2010   "forearm; at [place]"   [place]  03/21/2011   CHOLECYSTECTOMY N/A 10/21/2014   Procedure: LAPAROSCOPIC CHOLECYSTECTOMY WITH INTRAOPERATIVE CHOLANGIOGRAM;  Surgeon: Autumn Messing III, MD;  Location: [facility];  Service: General;  Laterality: N/A;   COLONOSCOPY     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   IR ANGIO INTRA EXTRACRAN SEL INTERNAL CAROTID UNI R MOD SED  11/30/2021   IR ANGIOGRAM FOLLOW UP STUDY  11/30/2021   IR NEURO EACH ADD'L AFTER BASIC UNI RIGHT (MS)  11/30/2021   IR TRANSCATH/EMBOLIZ  11/30/2021   KIDNEY TRANSPLANT  08/17/2011   [facility]    LAPAROSCOPIC CHOLECYSTECTOMY  10/21/2014   w/IOC   LEFT HEART CATHETERIZATION WITH CORONARY ANGIOGRAM N/A 03/21/2011   Procedure: LEFT HEART CATHETERIZATION WITH CORONARY ANGIOGRAM;  Surgeon: [name], MD;  Location: [facility] CATH LAB;  Service: Cardiovascular;  Laterality: N/A;   NEPHRECTOMY  08/2013   removed transplaned kidney   POLYPECTOMY     POSTERIOR FUSION CERVICAL SPINE  06/25/2012   for spinal stenosis   RADIOLOGY WITH ANESTHESIA N/A 11/30/2021   Procedure: RIGHT MIDDLE MENINGEAL ARTERY EMBOLIZATION;  Surgeon: Radiologist, Medication, MD;  Location: [facility];  Service: Radiology;  Laterality: N/A;   VASECTOMY  2010   Patient Active Problem List   Diagnosis Date Noted   Cerebral aneurysm 11/30/2021   Subdural hematoma ([place])  11/30/2021   Traumatic subdural hematoma (SDH) ([facility]) 11/21/2021   SDH (subdural hematoma) ([place]) 11/08/2021   Seizure ([place]) 11/08/2021   Chronic left shoulder pain 09/21/2021   Chronic right shoulder pain 09/21/2021   Cough with hemoptysis 08/01/2021   Preop respiratory exam 03/02/2021   Intractable nausea and vomiting 01/09/2021   Abnormal CT of the chest 05/24/2020   Rotator cuff syndrome of right shoulder 97/41/6384   Chronic systolic heart failure ([place]) 09/25/2018   Fluid overload 09/17/2018   Hypertensive urgency 09/17/2018   Right knee pain 06/02/2018   Right tibial fracture 06/02/2018   Cigarette smoker 07/11/2017   Anxiety 05/30/2017   Hypokalemia    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   Shunt malfunction 07/03/2016   Need for hepatitis C screening test 06/29/2015   Myalgia 05/17/2015   Secondary central sleep apnea 12/09/2014   Obesity hypoventilation syndrome ([place]) 12/09/2014   Narcotic drug use 12/09/2014   Hypersomnia with sleep apnea 12/09/2014   SCC (squamous cell carcinoma), arm 11/09/2014   Mild persistent chronic asthma without complication 53/64/6803   ESRD on dialysis ([place]) 09/09/2014   Essential hypertension 09/09/2014   HLD (hyperlipidemia) 09/09/2014   Pancytopenia ([place]) 04/28/2014   Thrombocytopenia ([place]) 04/10/2014   Fungal dermatitis 03/17/2014   S/p nephrectomy 09/17/2013   Anemia 09/03/2012   Uncontrolled hypertension    Tension headache    Memory loss    Edema    Thoracic or lumbosacral neuritis or radiculitis 03/27/2012   Nerve root pain 03/27/2012   History of kidney transplant 09/10/2011   Type 2 diabetes mellitus with diabetic nephropathy, without long-term current use of insulin ([place]) 08/30/2011   Hearing loss 08/16/2011   Obesity 08/16/2011   OSA on CPAP 08/05/2011   GIB (gastrointestinal bleeding) 10/28/2007    ONSET DATE: October 2023  REFERRING DIAG: S06.5X1A (ICD-10-CM) - Traumatic subdural hematoma with loss of consciousness of 30  minutes or less, initial encounter ([place]) I67.1 (ICD-10-CM) - Cerebral aneurysm  THERAPY DIAG:  Muscle weakness (generalized)  Difficulty in walking, not elsewhere classified  Unsteadiness on feet  Other symptoms and signs involving the musculoskeletal system  Other abnormalities of gait and mobility  Rationale for Evaluation and Treatment: Rehabilitation  SUBJECTIVE:  SUBJECTIVE STATEMENT: Seizure activity and SDH with prolonged hospitalization and inpatient rehab. Continued gait, balance, and functional activity tolerance deficits Pt accompanied by: significant other  PERTINENT HISTORY: 61 y.o. male whose history initially begins approximately 1 month ago when he presented to the emergency department with seizure.  His imaging revealed a small acute right convexity subdural hematoma.  At the time he did not require operative intervention.  Patient has subsequently presented with headache and further seizures which have been managed medically.  Repeat CT scan has demonstrated enlargement of the now subacute/chronic right frontal convexity subdural hematoma, with slightly worsened local mass effect and midline shift.    PAIN:  Are you having pain? Yes: NPRS scale: 12-13/10 Pain location: lumbar spine Pain description: sore/ache/stab Aggravating factors: sitting too long Relieving factors: nothing  PRECAUTIONS: Fall  WEIGHT BEARING RESTRICTIONS: No  FALLS: Has patient fallen in last 6 months? Yes. Number of falls 1 big one and multiple falls backwards to chairs  LIVING ENVIRONMENT: Lives with: lives with their family and lives with their spouse Lives in: House/apartment Stairs: Yes: Internal: 7 to landing + 4 steps; on right going up and bilateral but cannot reach both Has following equipment at  home: Single point cane  PLOF: Independent with household mobility with device  PATIENT GOALS: "not be dependent on cane"  OBJECTIVE:   DIAGNOSTIC FINDINGS: imaging  COGNITION: Overall cognitive status: Impaired and very poor safety awareness and insight to his deficits   SENSATION: WFL  COORDINATION: RUE and RLE limitations > LUE/LLE  EDEMA:  none  MUSCLE TONE: WNL  MUSCLE LENGTH:   DTRs:  NT  POSTURE: WFL  LOWER EXTREMITY ROM:     WNL  LOWER EXTREMITY MMT:    RLE 3/5 gross stretngth LLE 4/5 gross strength  BED MOBILITY:  NT  TRANSFERS: Assistive device utilized: Single point cane  Sit to stand: Min A Stand to sit: Min A Chair to chair: Min A Floor:  NT  RAMP:  Level of Assistance: Min A Assistive device utilized: Single point cane Ramp Comments:   CURB:  Level of Assistance: Min A Assistive device utilized: Single point cane Curb Comments:   STAIRS: Level of Assistance: Min A Stair Negotiation Technique: Step to Pattern with Single Rail on Left Number of Stairs: 4  Height of Stairs: 4-6  Comments:   GAIT: Gait pattern:  unsteady throughout right LOB and wide BOS Distance walked: 75 Assistive device utilized: Single point cane Level of assistance: Min A Comments: very unsteady, recommend use of RW vs cane--pt resistant to this suggestion  FUNCTIONAL TESTS:  5 times sit to stand: 51.35 sec w/ min A Timed up and go (TUG): 25.15 sec Berg Balance Scale: NT  PATIENT SURVEYS:       PATIENT EDUCATION: Education details: recommend use of RW vs cane due to unsteadiness and high risk for falls Person educated: Patient and Spouse Education method: Explanation Education comprehension:  pt is resistant to this recommendation  HOME EXERCISE PROGRAM: TBD  GOALS: Goals reviewed with patient? Yes  SHORT TERM GOALS: Target date: 01/11/2022    Patient will be independent in HEP to improve functional outcomes Baseline: Goal status:  INITIAL  2.  Demo functional transfers with supervision and good safety awareness requiring only occasional cues in sequence and safety Baseline: min A Goal status: INITIAL  3.  Demo improved safety in functional mobility demonstrating supervision performance x 150 ft w/out LOB in order to reduce risk for falls Baseline: Min A  w/ cane Goal status: INITIAL  4.  Demo improved safety with functional mobility as evidenced by time 15 sec TUG test using least restrictive AD Baseline: 25 sec w/ min A and cane Goal status: INITIAL    LONG TERM GOALS: Target date: 02/15/2022    Ambulate x 300 ft w/ least restrictive AD and modified independence on level surfaces, curb negotiation, ramp, and stair ambulation to improve safety in functional mobility and reduce need for caregiver assistance Baseline: Min A Goal status: INITIAL  2.  Manifest improved BLE strength and dynamic balance as evidenced by time 15 sec 5xSTS Baseline: 51 sec requiring min A to complete Goal status: INITIAL  3.  Perform functional transfers with modified independence and good safety awareness to reduce risk for falls Baseline: Min A, poor safety Goal status: INITIAL  4.  Demo low risk for falls per time 12 seconds TUG test Baseline: 25+ w/ Min A required Goal status: INITIAL    ASSESSMENT:  CLINICAL IMPRESSION: Patient is a 61 y.o. male who was seen today for physical therapy evaluation and treatment for deficits and limitations following seizure and SDH.  Patient demonstrates very high risk for falls, generalized weakness, balance deficits, safety awareness deficits, reduced functional activity tolerance, and increased need for caregiver assistance.  Patient would benefit from PT services to increase independence and safety with functional mobility and hopefully reduce risk for falls   OBJECTIVE IMPAIRMENTS: Abnormal gait, decreased activity tolerance, decreased balance, decreased coordination, decreased  endurance, decreased knowledge of use of DME, decreased mobility, difficulty walking, decreased strength, decreased safety awareness, improper body mechanics, and pain.   ACTIVITY LIMITATIONS: carrying, lifting, bending, standing, squatting, sleeping, stairs, transfers, toileting, dressing, reach over head, and locomotion level  PARTICIPATION LIMITATIONS: meal prep, cleaning, laundry, shopping, community activity, occupation, and yard work  PERSONAL FACTORS: Age, Time since onset of injury/illness/exacerbation, and 3+ comorbidities: multiple systems with numerous medical and physical co-morbidities  are also affecting patient's functional outcome.   REHAB POTENTIAL: Fair due to interaction of conditions  CLINICAL DECISION MAKING: Evolving/moderate complexity  EVALUATION COMPLEXITY: Moderate  PLAN:  PT FREQUENCY: 1-2x/week  PT DURATION: 8 weeks  PLANNED INTERVENTIONS: Therapeutic exercises, Therapeutic activity, Neuromuscular re-education, Balance training, Gait training, Patient/Family education, Self Care, Joint mobilization, Stair training, Vestibular training, Canalith repositioning, Orthotic/Fit training, DME instructions, Aquatic Therapy, Dry Needling, Electrical stimulation, Spinal mobilization, Cryotherapy, Moist heat, Splintting, Taping, Traction, Ionotophoresis '4mg'$ /ml Dexamethasone, and Manual therapy  PLAN FOR NEXT SESSION: transfer from elevated seat   4:45 PM, 12/21/21 M. Sherlyn Lees, PT, DPT Physical Therapist- [place] Office Number: [phone]
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
## 2021-12-22 ENCOUNTER — Other Ambulatory Visit: Payer: Self-pay | Admitting: Neurological Surgery

## 2021-12-22 ENCOUNTER — Telehealth: Payer: Self-pay | Admitting: *Deleted

## 2021-12-22 DIAGNOSIS — D631 Anemia in chronic kidney disease: Secondary | ICD-10-CM | POA: Diagnosis not present

## 2021-12-22 DIAGNOSIS — N186 End stage renal disease: Secondary | ICD-10-CM | POA: Diagnosis not present

## 2021-12-22 DIAGNOSIS — R569 Unspecified convulsions: Secondary | ICD-10-CM

## 2021-12-22 DIAGNOSIS — D509 Iron deficiency anemia, unspecified: Secondary | ICD-10-CM | POA: Diagnosis not present

## 2021-12-22 DIAGNOSIS — Z992 Dependence on renal dialysis: Secondary | ICD-10-CM | POA: Diagnosis not present

## 2021-12-22 DIAGNOSIS — N2581 Secondary hyperparathyroidism of renal origin: Secondary | ICD-10-CM | POA: Diagnosis not present

## 2021-12-22 DIAGNOSIS — T8612 Kidney transplant failure: Secondary | ICD-10-CM | POA: Diagnosis not present

## 2021-12-22 DIAGNOSIS — S065XAA Traumatic subdural hemorrhage with loss of consciousness status unknown, initial encounter: Secondary | ICD-10-CM

## 2021-12-22 NOTE — Telephone Encounter (Signed)
Patient wife notified and agreed.  

## 2021-12-22 NOTE — Telephone Encounter (Signed)
Patient wife, Mechele Claude called wanting to know if you would place a referral to Neurologist, Dr. Asencion Partridge Dohmeier for patient's seizures and to regulate his medications.   Requesting a Referral to be placed.   Please Advise.

## 2021-12-22 NOTE — Telephone Encounter (Signed)
Referral order for Neurologist Dr. Asencion Partridge Dohmeier as requested.

## 2021-12-25 DIAGNOSIS — D509 Iron deficiency anemia, unspecified: Secondary | ICD-10-CM | POA: Diagnosis not present

## 2021-12-25 DIAGNOSIS — N2581 Secondary hyperparathyroidism of renal origin: Secondary | ICD-10-CM | POA: Diagnosis not present

## 2021-12-25 DIAGNOSIS — D631 Anemia in chronic kidney disease: Secondary | ICD-10-CM | POA: Diagnosis not present

## 2021-12-25 DIAGNOSIS — Z992 Dependence on renal dialysis: Secondary | ICD-10-CM | POA: Diagnosis not present

## 2021-12-25 DIAGNOSIS — N186 End stage renal disease: Secondary | ICD-10-CM | POA: Diagnosis not present

## 2021-12-27 DIAGNOSIS — N186 End stage renal disease: Secondary | ICD-10-CM | POA: Diagnosis not present

## 2021-12-27 DIAGNOSIS — Z992 Dependence on renal dialysis: Secondary | ICD-10-CM | POA: Diagnosis not present

## 2021-12-27 DIAGNOSIS — D509 Iron deficiency anemia, unspecified: Secondary | ICD-10-CM | POA: Diagnosis not present

## 2021-12-27 DIAGNOSIS — N2581 Secondary hyperparathyroidism of renal origin: Secondary | ICD-10-CM | POA: Diagnosis not present

## 2021-12-27 DIAGNOSIS — D631 Anemia in chronic kidney disease: Secondary | ICD-10-CM | POA: Diagnosis not present

## 2021-12-28 DIAGNOSIS — G609 Hereditary and idiopathic neuropathy, unspecified: Secondary | ICD-10-CM | POA: Diagnosis not present

## 2021-12-29 DIAGNOSIS — N186 End stage renal disease: Secondary | ICD-10-CM | POA: Diagnosis not present

## 2021-12-29 DIAGNOSIS — D631 Anemia in chronic kidney disease: Secondary | ICD-10-CM | POA: Diagnosis not present

## 2021-12-29 DIAGNOSIS — Z992 Dependence on renal dialysis: Secondary | ICD-10-CM | POA: Diagnosis not present

## 2021-12-29 DIAGNOSIS — N2581 Secondary hyperparathyroidism of renal origin: Secondary | ICD-10-CM | POA: Diagnosis not present

## 2021-12-29 DIAGNOSIS — D509 Iron deficiency anemia, unspecified: Secondary | ICD-10-CM | POA: Diagnosis not present

## 2022-01-01 DIAGNOSIS — Z992 Dependence on renal dialysis: Secondary | ICD-10-CM | POA: Diagnosis not present

## 2022-01-01 DIAGNOSIS — D631 Anemia in chronic kidney disease: Secondary | ICD-10-CM | POA: Diagnosis not present

## 2022-01-01 DIAGNOSIS — D509 Iron deficiency anemia, unspecified: Secondary | ICD-10-CM | POA: Diagnosis not present

## 2022-01-01 DIAGNOSIS — N186 End stage renal disease: Secondary | ICD-10-CM | POA: Diagnosis not present

## 2022-01-01 DIAGNOSIS — N2581 Secondary hyperparathyroidism of renal origin: Secondary | ICD-10-CM | POA: Diagnosis not present

## 2022-01-01 NOTE — Therapy (Incomplete)
OUTPATIENT PHYSICAL THERAPY NEURO TREATMENT   Patient Name: Kirk [name] MRN: [id number] DOB:1960-12-17, 61 y.o., male Today's Date: 01/02/2022   PCP: Sandrea Hughs, NP REFERRING PROVIDER: Consuella Lose, MD  END OF SESSION:  PT End of Session - 01/02/22 1001     Visit Number 2    Number of Visits 12    Date for PT Re-Evaluation 02/15/22    Authorization Type Medicare A&B    Progress Note Due on Visit 10    PT Start Time 0930    PT Stop Time 0958    PT Time Calculation (min) 28 min    Equipment Utilized During Treatment Gait belt    Activity Tolerance Patient tolerated treatment well;Other (comment)   lethargy, dizziness   Behavior During Therapy Flat affect              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)    Anxiety    Asthma    Asthma    moderate persistent   Carpal tunnel syndrome    Cellulitis and abscess of trunk    Cholelithiasis 07/13/2014   Chronic headaches    Debility, unspecified    Dermatophytosis of the body    Dysrhythmia    history of   Edema    End stage renal disease on dialysis [facility])    "MWF; [facility]" (10/21/2014)   Essential hypertension, benign    GERD (gastroesophageal reflux disease)    Gout, unspecified    HTN (hypertension)    Hypertrophy of prostate without urinary obstruction and other lower urinary tract symptoms (LUTS)    Hypotension, unspecified    Impotence of organic origin    Insomnia, unspecified    Kidney replaced by transplant    Localization-related (focal) (partial) epilepsy and epileptic syndromes with complex partial seizures, without mention of intractable epilepsy    12-15-19- Wife states he has NEVER had a seizure    Lumbago    Memory loss    OSA on CPAP    Other and unspecified hyperlipidemia    controlled /managed per wife     Other chronic nonalcoholic liver disease    Other malaise and fatigue    Other nonspecific abnormal serum enzyme levels    Pain in joint, lower leg    Pain in joint, upper arm    Pneumonia "several times"   PONV (postoperative nausea and vomiting)    Renal dialysis status(V45.11) 02/05/2010   restarted 01/02/13 ofter renal trransplant failure   Secondary hyperparathyroidism (of renal origin)    Shortness of breath    Sleep apnea    wears cpap    Tension headache    Unspecified constipation    Unspecified essential hypertension    Unspecified hereditary and idiopathic peripheral neuropathy    Unspecified vitamin D deficiency    Past Surgical History:  Procedure Laterality Date   AV FISTULA PLACEMENT Left ?2010   "forearm; at [place]"   [place]  03/21/2011   CHOLECYSTECTOMY N/A 10/21/2014   Procedure: LAPAROSCOPIC CHOLECYSTECTOMY WITH INTRAOPERATIVE CHOLANGIOGRAM;  Surgeon: Autumn Messing III, MD;  Location: [facility];  Service: General;  Laterality: N/A;   COLONOSCOPY     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   IR ANGIO INTRA  EXTRACRAN SEL INTERNAL CAROTID UNI R MOD SED  11/30/2021   IR ANGIOGRAM FOLLOW UP STUDY  11/30/2021   IR NEURO EACH ADD'L AFTER BASIC UNI RIGHT (MS)  11/30/2021   IR TRANSCATH/EMBOLIZ  11/30/2021   KIDNEY TRANSPLANT  08/17/2011   [facility]    LAPAROSCOPIC CHOLECYSTECTOMY  10/21/2014   w/IOC   LEFT HEART CATHETERIZATION WITH CORONARY ANGIOGRAM N/A 03/21/2011   Procedure: LEFT HEART CATHETERIZATION WITH CORONARY ANGIOGRAM;  Surgeon: [name], MD;  Location: [facility] CATH LAB;  Service: Cardiovascular;  Laterality: N/A;   NEPHRECTOMY  08/2013   removed transplaned kidney   POLYPECTOMY     POSTERIOR FUSION CERVICAL SPINE  06/25/2012   for spinal stenosis   RADIOLOGY WITH ANESTHESIA N/A 11/30/2021   Procedure: RIGHT MIDDLE MENINGEAL ARTERY EMBOLIZATION;  Surgeon: Radiologist, Medication, MD;  Location: [facility];  Service: Radiology;  Laterality: N/A;   VASECTOMY  2010   Patient Active Problem List   Diagnosis Date Noted   Cerebral aneurysm 11/30/2021   Subdural hematoma ([place]) 11/30/2021   Traumatic subdural hematoma (SDH) ([facility]) 11/21/2021   SDH (subdural hematoma) ([place]) 11/08/2021   Seizure ([place]) 11/08/2021   Chronic left shoulder pain 09/21/2021   Chronic right shoulder pain 09/21/2021   Cough with hemoptysis 08/01/2021   Preop respiratory exam 03/02/2021   Intractable nausea and vomiting 01/09/2021   Abnormal CT of the chest 05/24/2020   Rotator cuff syndrome of right shoulder 38/45/3646   Chronic systolic heart failure ([place]) 09/25/2018   Fluid overload 09/17/2018   Hypertensive urgency 09/17/2018   Right knee pain 06/02/2018   Right tibial fracture 06/02/2018   Cigarette smoker 07/11/2017   Anxiety 05/30/2017   Hypokalemia    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   Shunt malfunction 07/03/2016   Need for hepatitis C screening test 06/29/2015   Myalgia 05/17/2015   Secondary central sleep apnea 12/09/2014   Obesity hypoventilation syndrome ([place]) 12/09/2014   Narcotic drug use 12/09/2014   Hypersomnia with sleep apnea 12/09/2014   SCC (squamous cell carcinoma), arm 11/09/2014   Mild persistent chronic asthma without complication 80/32/1224   ESRD on dialysis ([place]) 09/09/2014   Essential hypertension 09/09/2014   HLD (hyperlipidemia) 09/09/2014   Pancytopenia ([place]) 04/28/2014   Thrombocytopenia ([place]) 04/10/2014   Fungal dermatitis 03/17/2014   S/p nephrectomy 09/17/2013   Anemia 09/03/2012   Uncontrolled hypertension    Tension headache    Memory loss    Edema    Thoracic or lumbosacral neuritis or radiculitis 03/27/2012   Nerve root pain 03/27/2012   History of kidney transplant 09/10/2011   Type 2 diabetes mellitus with diabetic nephropathy, without long-term current use of insulin ([place]) 08/30/2011   Hearing loss 08/16/2011   Obesity 08/16/2011   OSA on CPAP 08/05/2011   GIB  (gastrointestinal bleeding) 10/28/2007    ONSET DATE: October 2023  REFERRING DIAG: S06.5X1A (ICD-10-CM) - Traumatic subdural hematoma with loss of consciousness of 30 minutes or less, initial encounter ([place]) I67.1 (ICD-10-CM) - Cerebral aneurysm  THERAPY DIAG:  Muscle weakness (generalized)  Unsteadiness on feet  Other symptoms and signs involving the musculoskeletal system  Difficulty in walking, not elsewhere classified  Other abnormalities of gait and mobility  Rationale for Evaluation and Treatment: Rehabilitation  SUBJECTIVE:  SUBJECTIVE STATEMENT: Tiredness. Reports that he fell going up the stairs last night- denies injury and reports his dog broke his fall.  Pt accompanied by: significant other  PERTINENT HISTORY: 61 y.o. male whose history initially begins approximately 1 month ago when he presented to the emergency department with seizure.  His imaging revealed a small acute right convexity subdural hematoma.  At the time he did not require operative intervention.  Patient has subsequently presented with headache and further seizures which have been managed medically.  Repeat CT scan has demonstrated enlargement of the now subacute/chronic right frontal convexity subdural hematoma, with slightly worsened local mass effect and midline shift.    PAIN:  Are you having pain? Yes: NPRS scale: past 10/10 Pain location: lumbar spine Pain description: sore/ache/stab Aggravating factors: sitting too long Relieving factors: nothing  PRECAUTIONS: Fall  WEIGHT BEARING RESTRICTIONS: No  FALLS: Has patient fallen in last 6 months? Yes. Number of falls 1 big one and multiple falls backwards to chairs  LIVING ENVIRONMENT: Lives with: lives with their family and lives with their spouse Lives in:  House/apartment Stairs: Yes: Internal: 7 to landing + 4 steps; on right going up and bilateral but cannot reach both Has following equipment at home: Single point cane  PLOF: Independent with household mobility with device  PATIENT GOALS: "not be dependent on cane"  OBJECTIVE:        TODAY'S TREATMENT: 01/02/22 Activity Comments  Nustep L2 x 5 min Ues/Les  Pt requires frequent redirection to stay on task/keep going d/t poor concentration/fatigue  elevated STS x9 reps  Elevated seat and pushing off knees; pt became lethargic and noted dizziness- discontinued   Vitals R arm 94% spO2, 57bpm, 172/65 mmHg  Educated on belly breathing with manual feedback   Vitals R arm 175/65 mmHg                 PATIENT EDUCATION: Education details: encouraged continued monitoring of BP and symptoms throughout rest of day and present to ED if red flag signs occur Person educated: Patient and Spouse Education method: Explanation Education comprehension: verbalized understanding and returned demonstration    Below measures were taken at time of initial evaluation unless otherwise specified:   DIAGNOSTIC FINDINGS: imaging  COGNITION: Overall cognitive status: Impaired and very poor safety awareness and insight to his deficits   SENSATION: WFL  COORDINATION: RUE and RLE limitations > LUE/LLE  EDEMA:  none  MUSCLE TONE: WNL  MUSCLE LENGTH:   DTRs:  NT  POSTURE: WFL  LOWER EXTREMITY ROM:     WNL  LOWER EXTREMITY MMT:    RLE 3/5 gross stretngth LLE 4/5 gross strength  BED MOBILITY:  NT  TRANSFERS: Assistive device utilized: Single point cane  Sit to stand: Min A Stand to sit: Min A Chair to chair: Min A Floor:  NT  RAMP:  Level of Assistance: Min A Assistive device utilized: Single point cane Ramp Comments:   CURB:  Level of Assistance: Min A Assistive device utilized: Single point cane Curb Comments:   STAIRS: Level of Assistance: Min A Artist Technique: Step to Pattern with Single Rail on Left Number of Stairs: 4  Height of Stairs: 4-6  Comments:   GAIT: Gait pattern:  unsteady throughout right LOB and wide BOS Distance walked: 75 Assistive device utilized: Single point cane Level of assistance: Min A Comments: very unsteady, recommend use of RW vs cane--pt resistant to this suggestion  FUNCTIONAL TESTS:  5 times sit to  stand: 51.35 sec w/ min A Timed up and go (TUG): 25.15 sec Berg Balance Scale: NT  PATIENT SURVEYS:       PATIENT EDUCATION: Education details: recommend use of RW vs cane due to unsteadiness and high risk for falls Person educated: Patient and Spouse Education method: Explanation Education comprehension:  pt is resistant to this recommendation  HOME EXERCISE PROGRAM: TBD  GOALS: Goals reviewed with patient? Yes  SHORT TERM GOALS: Target date: 01/11/2022    Patient will be independent in HEP to improve functional outcomes Baseline: Goal status: IN PROGRESS  2.  Demo functional transfers with supervision and good safety awareness requiring only occasional cues in sequence and safety Baseline: min A Goal status: IN PROGRESS  3.  Demo improved safety in functional mobility demonstrating supervision performance x 150 ft w/out LOB in order to reduce risk for falls Baseline: Min A w/ cane Goal status: IN PROGRESS  4.  Demo improved safety with functional mobility as evidenced by time 15 sec TUG test using least restrictive AD Baseline: 25 sec w/ min A and cane Goal status: IN PROGRESS    LONG TERM GOALS: Target date: 02/15/2022    Ambulate x 300 ft w/ least restrictive AD and modified independence on level surfaces, curb negotiation, ramp, and stair ambulation to improve safety in functional mobility and reduce need for caregiver assistance Baseline: Min A Goal status: IN PROGRESS  2.  Manifest improved BLE strength and dynamic balance as evidenced by time 15 sec  5xSTS Baseline: 51 sec requiring min A to complete Goal status: IN PROGRESS  3.  Perform functional transfers with modified independence and good safety awareness to reduce risk for falls Baseline: Min A, poor safety Goal status: IN PROGRESS  4.  Demo low risk for falls per time 12 seconds TUG test Baseline: 25+ w/ Min A required Goal status: IN PROGRESS    ASSESSMENT:  CLINICAL IMPRESSION: Patient arrived to session with report of continued LBP- wife reports that this has been ongoing since April. Also admits to a fall yesterday without injury. Worked on STS transfers from elevated surface with consistent cueing for continued movement. Patient noted dizziness and became lethargic with increased reps. Vitals showed bradycardia and elevated systolic BP. No other problematic signs or symptoms. Wife reports that this usually happens when his pain levels go up. Instructed on gentle breathing- vitals unchanged. Educated patient and wife to continue to monitor BP throughout the day. Educated on calling 911 if red flag symptoms occur. Ended session early.   OBJECTIVE IMPAIRMENTS: Abnormal gait, decreased activity tolerance, decreased balance, decreased coordination, decreased endurance, decreased knowledge of use of DME, decreased mobility, difficulty walking, decreased strength, decreased safety awareness, improper body mechanics, and pain.   ACTIVITY LIMITATIONS: carrying, lifting, bending, standing, squatting, sleeping, stairs, transfers, toileting, dressing, reach over head, and locomotion level  PARTICIPATION LIMITATIONS: meal prep, cleaning, laundry, shopping, community activity, occupation, and yard work  PERSONAL FACTORS: Age, Time since onset of injury/illness/exacerbation, and 3+ comorbidities: multiple systems with numerous medical and physical co-morbidities  are also affecting patient's functional outcome.   REHAB POTENTIAL: Fair due to interaction of conditions  CLINICAL DECISION  MAKING: Evolving/moderate complexity  EVALUATION COMPLEXITY: Moderate  PLAN:  PT FREQUENCY: 1-2x/week  PT DURATION: 8 weeks  PLANNED INTERVENTIONS: Therapeutic exercises, Therapeutic activity, Neuromuscular re-education, Balance training, Gait training, Patient/Family education, Self Care, Joint mobilization, Stair training, Vestibular training, Canalith repositioning, Orthotic/Fit training, DME instructions, Aquatic Therapy, Dry Needling, Electrical stimulation, Spinal mobilization, Cryotherapy, Moist  heat, Splintting, Taping, Traction, Ionotophoresis '4mg'$ /ml Dexamethasone, and Manual therapy  PLAN FOR NEXT SESSION: monitor vitals; transfer from elevated seat   Janene Harvey, PT, DPT 01/02/22 10:05 AM  [facility] [place], [place], [place] [address] Phone # [phone] Fax # [phone]

## 2022-01-02 ENCOUNTER — Other Ambulatory Visit: Payer: Self-pay

## 2022-01-02 ENCOUNTER — Encounter: Payer: Self-pay | Admitting: Occupational Therapy

## 2022-01-02 ENCOUNTER — Encounter: Payer: Self-pay | Admitting: Physical Therapy

## 2022-01-02 ENCOUNTER — Ambulatory Visit: Payer: Medicare Other | Admitting: Physical Therapy

## 2022-01-02 ENCOUNTER — Ambulatory Visit: Payer: Medicare Other | Attending: Neurosurgery

## 2022-01-02 ENCOUNTER — Ambulatory Visit: Payer: Medicare Other | Admitting: Occupational Therapy

## 2022-01-02 DIAGNOSIS — R2689 Other abnormalities of gait and mobility: Secondary | ICD-10-CM

## 2022-01-02 DIAGNOSIS — M6281 Muscle weakness (generalized): Secondary | ICD-10-CM | POA: Insufficient documentation

## 2022-01-02 DIAGNOSIS — R262 Difficulty in walking, not elsewhere classified: Secondary | ICD-10-CM

## 2022-01-02 DIAGNOSIS — R41842 Visuospatial deficit: Secondary | ICD-10-CM | POA: Insufficient documentation

## 2022-01-02 DIAGNOSIS — R29898 Other symptoms and signs involving the musculoskeletal system: Secondary | ICD-10-CM

## 2022-01-02 DIAGNOSIS — R41841 Cognitive communication deficit: Secondary | ICD-10-CM | POA: Diagnosis not present

## 2022-01-02 DIAGNOSIS — R2681 Unsteadiness on feet: Secondary | ICD-10-CM

## 2022-01-02 NOTE — Therapy (Signed)
OUTPATIENT SPEECH LANGUAGE PATHOLOGY EVALUATION   Patient Name: Kirk [name] MRN: [id number] DOB:02-07-1960, 61 y.o., male Today's Date: 01/02/2022  PCP: Sandrea Hughs, NP REFERRING PROVIDER: Consuella Lose, MD  END OF SESSION:  End of Session - 01/02/22 2310     Visit Number 1    Number of Visits 25    Date for SLP Re-Evaluation 04/02/22    SLP Start Time 0851    SLP Stop Time  0931    SLP Time Calculation (min) 40 min    Activity Tolerance Patient limited by lethargy             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)    Anxiety    Asthma    Asthma    moderate persistent   Carpal tunnel syndrome    Cellulitis and abscess of trunk    Cholelithiasis 07/13/2014   Chronic headaches    Debility, unspecified    Dermatophytosis of the body    Dysrhythmia    history of   Edema    End stage renal disease on dialysis ([place])    "MWF; [facility] in [place]" (10/21/2014)   Essential hypertension, benign    GERD (gastroesophageal reflux disease)    Gout, unspecified    HTN (hypertension)    Hypertrophy of prostate without urinary obstruction and other lower urinary tract symptoms (LUTS)    Hypotension, unspecified    Impotence of organic origin    Insomnia, unspecified    Kidney replaced by transplant    Localization-related (focal) (partial) epilepsy and epileptic syndromes with complex partial seizures, without mention of intractable epilepsy    12-15-19- Wife states he has NEVER had a seizure    Lumbago    Memory loss    OSA on CPAP    Other and unspecified hyperlipidemia    controlled /managed per wife    Other chronic nonalcoholic liver disease    Other malaise and fatigue    Other nonspecific abnormal serum enzyme levels    Pain in joint, lower leg    Pain in joint, upper arm    Pneumonia  "several times"   PONV (postoperative nausea and vomiting)    Renal dialysis status(V45.11) 02/05/2010   restarted 01/02/13 ofter renal trransplant failure   Secondary hyperparathyroidism (of renal origin)    Shortness of breath    Sleep apnea    wears cpap    Tension headache    Unspecified constipation    Unspecified essential hypertension    Unspecified hereditary and idiopathic peripheral neuropathy    Unspecified vitamin D deficiency    Past Surgical History:  Procedure Laterality Date   AV FISTULA PLACEMENT Left ?2010   "forearm; at [place]"   [place]  03/21/2011   CHOLECYSTECTOMY N/A 10/21/2014   Procedure: LAPAROSCOPIC CHOLECYSTECTOMY WITH INTRAOPERATIVE CHOLANGIOGRAM;  Surgeon: Autumn Messing III, MD;  Location: [facility];  Service: General;  Laterality: N/A;   COLONOSCOPY     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   IR ANGIO INTRA EXTRACRAN SEL INTERNAL CAROTID UNI R MOD SED  11/30/2021   IR ANGIOGRAM FOLLOW UP STUDY  11/30/2021   IR NEURO EACH ADD'L AFTER BASIC UNI RIGHT (MS)  11/30/2021   IR TRANSCATH/EMBOLIZ  11/30/2021  KIDNEY TRANSPLANT  08/17/2011   [facility]    LAPAROSCOPIC CHOLECYSTECTOMY  10/21/2014   w/IOC   LEFT HEART CATHETERIZATION WITH CORONARY ANGIOGRAM N/A 03/21/2011   Procedure: LEFT HEART CATHETERIZATION WITH CORONARY ANGIOGRAM;  Surgeon: [name], MD;  Location: [facility] CATH LAB;  Service: Cardiovascular;  Laterality: N/A;   NEPHRECTOMY  08/2013   removed transplaned kidney   POLYPECTOMY     POSTERIOR FUSION CERVICAL SPINE  06/25/2012   for spinal stenosis   RADIOLOGY WITH ANESTHESIA N/A 11/30/2021   Procedure: RIGHT MIDDLE MENINGEAL ARTERY EMBOLIZATION;  Surgeon: Radiologist, Medication, MD;  Location: [facility];  Service: Radiology;  Laterality: N/A;   VASECTOMY  2010   Patient Active Problem List   Diagnosis Date Noted   Cerebral aneurysm 11/30/2021   Subdural hematoma ([place]) 11/30/2021    Traumatic subdural hematoma (SDH) (H[facility]) 11/21/2021   SDH (subdural hematoma) ([place]) 11/08/2021   Seizure ([place]) 11/08/2021   Chronic left shoulder pain 09/21/2021   Chronic right shoulder pain 09/21/2021   Cough with hemoptysis 08/01/2021   Preop respiratory exam 03/02/2021   Intractable nausea and vomiting 01/09/2021   Abnormal CT of the chest 05/24/2020   Rotator cuff syndrome of right shoulder 17/51/0258   Chronic systolic heart failure ([place]) 09/25/2018   Fluid overload 09/17/2018   Hypertensive urgency 09/17/2018   Right knee pain 06/02/2018   Right tibial fracture 06/02/2018   Cigarette smoker 07/11/2017   Anxiety 05/30/2017   Hypokalemia    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   Shunt malfunction 07/03/2016   Need for hepatitis C screening test 06/29/2015   Myalgia 05/17/2015   Secondary central sleep apnea 12/09/2014   Obesity hypoventilation syndrome ([place]) 12/09/2014   Narcotic drug use 12/09/2014   Hypersomnia with sleep apnea 12/09/2014   SCC (squamous cell carcinoma), arm 11/09/2014   Mild persistent chronic asthma without complication 52/77/8242   ESRD on dialysis ([place]) 09/09/2014   Essential hypertension 09/09/2014   HLD (hyperlipidemia) 09/09/2014   Pancytopenia ([place]) 04/28/2014   Thrombocytopenia ([place]) 04/10/2014   Fungal dermatitis 03/17/2014   S/p nephrectomy 09/17/2013   Anemia 09/03/2012   Uncontrolled hypertension    Tension headache    Memory loss    Edema    Thoracic or lumbosacral neuritis or radiculitis 03/27/2012   Nerve root pain 03/27/2012   History of kidney transplant 09/10/2011   Type 2 diabetes mellitus with diabetic nephropathy, without long-term current use of insulin ([place]) 08/30/2011   Hearing loss 08/16/2011   Obesity 08/16/2011   OSA on CPAP 08/05/2011   GIB (gastrointestinal bleeding) 10/28/2007    ONSET DATE: 11-07-21   REFERRING DIAG: P53.5X1A (ICD-10-CM) - Traumatic subdural hematoma with loss of consciousness of 30 minutes or less,  initial encounter ([place]) I67.1 (ICD-10-CM) - Cerebral aneurysm  THERAPY DIAG:  Cognitive communication deficit  Rationale for Evaluation and Treatment: Rehabilitation  SUBJECTIVE:   SUBJECTIVE STATEMENT: ""Ten minutes to - what was it - 11?" Pt accompanied by: significant other Wife Kirk [name]  PERTINENT HISTORY: 1 month ago when he presented to the emergency department with seizure-like activity.  His imaging revealed a small acute right convexity subdural hematoma. Patient continued to c/o headache and further seizures which are now managed medically.  Repeat CT scan demonstrated enlargement of the now subacute/chronic right frontal convexity subdural hematoma, with slightly worsened local mass effect and midline shift.  Patient underwent middle meningeal artery embolization. Pt has consult pending for possible back sx.  PAIN:  Are you having pain? Yes: NPRS scale: 10/10  Pain location: back Pain description: sore, sharp with certain movements  FALLS: Has patient fallen in last 6 months?  Yes. See PT eval.  LIVING ENVIRONMENT: Lives with: lives with their spouse Lives in: House/apartment  PLOF:  Level of assistance: Independent with ADLs  PATIENT GOALS: Better memory  OBJECTIVE:   DIAGNOSTIC FINDINGS:  DC from ST 11-30-21 (CIR) Clinical Impression/Discharge Summary: Patient has made functional gains and has met 3 of 4 long-term goals this admission. Pt limited by lethargy, fatigue, and ongoing headache. Pt is currently completing functional and basic-to-mildly complex cognitive tasks with supervision-to-min A verbal cues in regards to attention, problem solving, judgement/safety awareness, and functional recall. Patient's care partner is independent to provide the necessary physical and cognitive assistance at discharge. Patient would benefit from continued SLP services in home health setting to maximize cognitive-linguistic function and functional independence.   COGNITION: Overall  cognitive status: Impaired Areas of impairment:  Attention: Impaired: Selective, Alternating, Divided Memory: Impaired: Short term Awareness: Impaired: Intellectual, Emergent, and Anticipatory Executive function: Impaired: Impulse control, Self-correction, and Slow processing Behavior: Impulsive Functional deficits: Pt is not managing his medications currently. He did not do so prior to hosptialization however pt and Kirk [name] feel he could have if necessary. Pt is also asking about appointments and other events - at the time no calendar exists that is readily accessible for [place] at home.   COGNITIVE COMMUNICATION: Following directions: Follows multi-step commands inconsistently  Auditory comprehension: Impaired: For example SLP asked pt to place "X" in triangle and needed to repeat x2, then pt placed X in rectangle. Suspect that attention has much to do with this noted defict.  ORAL MOTOR EXAMINATION: Overall status: WFL  STANDARDIZED ASSESSMENTS: SLUMS: Pt scored 20/30 indicating "dementia" scoring range of 1-20. Notably, pt had difficulty with recall of 5 unrelated words (1/5) and details in an auditory discourse (3/4 questions correct), naming animals (13) , placing hour markers on a clock face,    PATIENT REPORTED OUTCOME MEASURES (PROM): Cognitive Function: will be provided to both pt and wife   TODAY'S TREATMENT:                                                                                                                                         DATE:  01/02/22: SLP discussed eval results with pt and wife and stated attention was thought to be some of the problem with pt's memory disorder/deficit, and that full cognitive evaluation (CLQT) would follow. SLP suggested to Kirk [name] that she should get a December calendar and write a schedule on it fo rpt to access PRN, and also that pt should set alarm in his phone for medication administration, with wife's help PRN.   PATIENT  EDUCATION: Education details: see above in "today's treatment" Person educated: Patient and Spouse Education method: Explanation and Demonstration Education comprehension: verbalized understanding, verbal cues required, and needs further education   GOALS:  Goals reviewed with patient? No  SHORT TERM GOALS: Target date: 02/09/22  Pt will arrive to ST with memory compensation device 70% of sessions Baseline: Goal status: INITIAL  2.  Pt will demo selective attention for simple therapy task in min noisy environment x 2 sessions Baseline:  Goal status: INITIAL  3.  Pt will complete full cognitive communication testing Baseline:  Goal status: INITIAL  4.  TBD PRN Baseline:  Goal status: INITIAL   LONG TERM GOALS: Target date: 04/02/22  Pt will score higher/better on PROM than initially Baseline:  Goal status: INITIAL  2.  [place] will demo adequate selective attention during a functional simple-mod complex task for 10 minutes, in 3 sessions Baseline:  Goal status: INITIAL  3.  [place] will utilize one memory strategy in 3 sessions, or between 3 sessions as reported by wife and/or pt Baseline:  Goal status: INITIAL  4.  TBD following evaluatoin Baseline:  Goal status: INITIAL   ASSESSMENT:  CLINICAL IMPRESSION: Patient is a 61 y.o. male who was seen today for cognitive deficits following seizure and SDH with embolization. He exhibits deficits in awareness attention, and memory, exhibited by decr'd ability to manage medication and manage appointments.  OBJECTIVE IMPAIRMENTS: include attention, memory, awareness, and executive functioning. These impairments are limiting patient from managing medications, managing appointments, managing finances, household responsibilities, ADLs/IADLs, and effectively communicating at home and in community. Factors affecting potential to achieve goals and functional outcome are ability to learn/carryover information, cooperation/participation  level, pain level, and severity of impairments.. Patient will benefit from skilled SLP services to address above impairments and improve overall function.  REHAB POTENTIAL: Fair to good due to pain level . Consult for sx is pending.  PLAN:  SLP FREQUENCY: 2x/week  SLP DURATION: 8 weeks  PLANNED INTERVENTIONS: Language facilitation, Environmental controls, Cognitive reorganization, Internal/external aids, Oral motor exercises, Functional tasks, Multimodal communication approach, SLP instruction and feedback, and Patient/family education    [facility], [place] 01/02/2022, 11:13 PM

## 2022-01-03 DIAGNOSIS — D509 Iron deficiency anemia, unspecified: Secondary | ICD-10-CM | POA: Diagnosis not present

## 2022-01-03 DIAGNOSIS — D631 Anemia in chronic kidney disease: Secondary | ICD-10-CM | POA: Diagnosis not present

## 2022-01-03 DIAGNOSIS — N186 End stage renal disease: Secondary | ICD-10-CM | POA: Diagnosis not present

## 2022-01-03 DIAGNOSIS — N2581 Secondary hyperparathyroidism of renal origin: Secondary | ICD-10-CM | POA: Diagnosis not present

## 2022-01-03 DIAGNOSIS — Z992 Dependence on renal dialysis: Secondary | ICD-10-CM | POA: Diagnosis not present

## 2022-01-03 NOTE — Therapy (Deleted)
[facility] [address] [place], [place], [address] Phone: [phone]   Fax:  [phone]  Patient Details  Name: Kirk [name] MRN: [id number] Date of Birth: Oct 01, 1960 Referring Provider:  Sandrea Hughs, NP  Encounter Date: 01/02/2022  Pt arrived but not seen by OT for scheduled appointment.  Pt with dizziness and lethargic during PT sessions. Vitals showed bradycardia and elevated systolic BP, therefore PT session terminated early and pt went home.  Simonne Come, OT 01/03/2022, 3:48 PM  [facility] [address] [place], [place], [address] Phone: [phone]   Fax:  [phone]

## 2022-01-03 NOTE — Therapy (Signed)
[facility] [address], [place], [place], [address] Phone: [phone]   Fax:  [phone]  Patient Details  Name: Kirk [name] MRN: [id number] Date of Birth: 12/12/1960 Referring Provider:  No ref. provider found  Encounter Date: 01/02/2022  Pt arrived but not seen for scheduled OT session.  During PT session, pt with dizziness and lethargic with increased reps. Vitals showed bradycardia and elevated systolic BP.  PT session terminated early and pt sent home with education to wife to monitor BP throughout the day.  Simonne Come, OT 01/03/2022, 3:52 PM  [facility] [address], [place], [place], [address] Phone: [phone]   Fax:  [phone]

## 2022-01-03 NOTE — Therapy (Incomplete)
OUTPATIENT PHYSICAL THERAPY NEURO TREATMENT   Patient Name: Kirk [name] MRN: [id number] DOB:27-Jun-1960, 61 y.o., male Today's Date: 01/03/2022   PCP: Sandrea Hughs, NP REFERRING PROVIDER: Consuella Lose, MD  END OF SESSION:     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)    Anxiety    Asthma    Asthma    moderate persistent   Carpal tunnel syndrome    Cellulitis and abscess of trunk    Cholelithiasis 07/13/2014   Chronic headaches    Debility, unspecified    Dermatophytosis of the body    Dysrhythmia    history of   Edema    End stage renal disease on dialysis ([place])    "MWF; [facility] in [place]" (10/21/2014)   Essential hypertension, benign    GERD (gastroesophageal reflux disease)    Gout, unspecified    HTN (hypertension)    Hypertrophy of prostate without urinary obstruction and other lower urinary tract symptoms (LUTS)    Hypotension, unspecified    Impotence of organic origin    Insomnia, unspecified    Kidney replaced by transplant    Localization-related (focal) (partial) epilepsy and epileptic syndromes with complex partial seizures, without mention of intractable epilepsy    12-15-19- Wife states he has NEVER had a seizure    Lumbago    Memory loss    OSA on CPAP    Other and unspecified hyperlipidemia    controlled /managed per wife    Other chronic nonalcoholic liver disease    Other malaise and fatigue    Other nonspecific abnormal serum enzyme levels    Pain in joint, lower leg    Pain in joint, upper arm    Pneumonia "several times"   PONV (postoperative nausea and vomiting)    Renal dialysis status(V45.11) 02/05/2010   restarted 01/02/13 ofter renal trransplant failure   Secondary hyperparathyroidism (of renal origin)    Shortness of breath    Sleep apnea    wears cpap     Tension headache    Unspecified constipation    Unspecified essential hypertension    Unspecified hereditary and idiopathic peripheral neuropathy    Unspecified vitamin D deficiency    Past Surgical History:  Procedure Laterality Date   AV FISTULA PLACEMENT Left ?2010   "forearm; at [facility]"   [place]  03/21/2011   CHOLECYSTECTOMY N/A 10/21/2014   Procedure: LAPAROSCOPIC CHOLECYSTECTOMY WITH INTRAOPERATIVE CHOLANGIOGRAM;  Surgeon: Autumn Messing III, MD;  Location: [place];  Service: General;  Laterality: N/A;   COLONOSCOPY     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   IR ANGIO INTRA EXTRACRAN SEL INTERNAL CAROTID UNI R MOD SED  11/30/2021   IR ANGIOGRAM FOLLOW UP STUDY  11/30/2021   IR NEURO EACH ADD'L AFTER BASIC UNI RIGHT (MS)  11/30/2021   IR TRANSCATH/EMBOLIZ  11/30/2021   KIDNEY TRANSPLANT  08/17/2011   [facility]    LAPAROSCOPIC CHOLECYSTECTOMY  10/21/2014   w/IOC   LEFT HEART CATHETERIZATION WITH CORONARY ANGIOGRAM N/A 03/21/2011   Procedure: LEFT HEART CATHETERIZATION WITH CORONARY ANGIOGRAM;  Surgeon: [name], MD;  Location[facility] CATH LAB;  Service: Cardiovascular;  Laterality: N/A;   NEPHRECTOMY  08/2013   removed transplaned kidney   POLYPECTOMY  POSTERIOR FUSION CERVICAL SPINE  06/25/2012   for spinal stenosis   RADIOLOGY WITH ANESTHESIA N/A 11/30/2021   Procedure: RIGHT MIDDLE MENINGEAL ARTERY EMBOLIZATION;  Surgeon: Radiologist, Medication, MD;  Location: [place];  Service: Radiology;  Laterality: N/A;   VASECTOMY  2010   Patient Active Problem List   Diagnosis Date Noted   Cerebral aneurysm 11/30/2021   Subdural hematoma ([place]) 11/30/2021   Traumatic subdural hematoma (SDH) ([facility]) 11/21/2021   SDH (subdural hematoma) ([place]) 11/08/2021   Seizure ([place]) 11/08/2021   Chronic left shoulder pain 09/21/2021   Chronic right shoulder pain 09/21/2021   Cough with hemoptysis 08/01/2021   Preop respiratory exam  03/02/2021   Intractable nausea and vomiting 01/09/2021   Abnormal CT of the chest 05/24/2020   Rotator cuff syndrome of right shoulder 03/49/1791   Chronic systolic heart failure ([place]) 09/25/2018   Fluid overload 09/17/2018   Hypertensive urgency 09/17/2018   Right knee pain 06/02/2018   Right tibial fracture 06/02/2018   Cigarette smoker 07/11/2017   Anxiety 05/30/2017   Hypokalemia    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   Shunt malfunction 07/03/2016   Need for hepatitis C screening test 06/29/2015   Myalgia 05/17/2015   Secondary central sleep apnea 12/09/2014   Obesity hypoventilation syndrome ([place]) 12/09/2014   Narcotic drug use 12/09/2014   Hypersomnia with sleep apnea 12/09/2014   SCC (squamous cell carcinoma), arm 11/09/2014   Mild persistent chronic asthma without complication 50/56/9794   ESRD on dialysis ([place]) 09/09/2014   Essential hypertension 09/09/2014   HLD (hyperlipidemia) 09/09/2014   Pancytopenia ([place]) 04/28/2014   Thrombocytopenia ([place]) 04/10/2014   Fungal dermatitis 03/17/2014   S/p nephrectomy 09/17/2013   Anemia 09/03/2012   Uncontrolled hypertension    Tension headache    Memory loss    Edema    Thoracic or lumbosacral neuritis or radiculitis 03/27/2012   Nerve root pain 03/27/2012   History of kidney transplant 09/10/2011   Type 2 diabetes mellitus with diabetic nephropathy, without long-term current use of insulin ([place]) 08/30/2011   Hearing loss 08/16/2011   Obesity 08/16/2011   OSA on CPAP 08/05/2011   GIB (gastrointestinal bleeding) 10/28/2007    ONSET DATE: October 2023  REFERRING DIAG: S06.5X1A (ICD-10-CM) - Traumatic subdural hematoma with loss of consciousness of 30 minutes or less, initial encounter ([place]) I67.1 (ICD-10-CM) - Cerebral aneurysm  THERAPY DIAG:  No diagnosis found.  Rationale for Evaluation and Treatment: Rehabilitation  SUBJECTIVE:  SUBJECTIVE STATEMENT: Tiredness. Reports that he fell going up the stairs last night- denies injury and reports his dog broke his fall.  Pt accompanied by: significant other  PERTINENT HISTORY: 61 y.o. male whose history initially begins approximately 1 month ago when he presented to the emergency department with seizure.  His imaging revealed a small acute right convexity subdural hematoma.  At the time he did not require operative intervention.  Patient has subsequently presented with headache and further seizures which have been managed medically.  Repeat CT scan has demonstrated enlargement of the now subacute/chronic right frontal convexity subdural hematoma, with slightly worsened local mass effect and midline shift.    PAIN:  Are you having pain? Yes: NPRS scale: past 10/10 Pain location: lumbar spine Pain description: sore/ache/stab Aggravating factors: sitting too long Relieving factors: nothing  PRECAUTIONS: Fall  WEIGHT BEARING RESTRICTIONS: No  FALLS: Has patient fallen in last 6 months? Yes. Number of falls 1 big one and multiple falls backwards to chairs  LIVING ENVIRONMENT: Lives with: lives with their family and lives with their spouse Lives in: House/apartment Stairs: Yes: Internal: 7 to landing + 4 steps; on right going up and bilateral but cannot reach both Has following equipment at home: Single point cane  PLOF: Independent with household mobility with device  PATIENT GOALS: "not be dependent on cane"  OBJECTIVE:      TODAY'S TREATMENT: 01/04/22 Activity Comments                       Below measures were taken at time of initial evaluation unless otherwise specified:   DIAGNOSTIC FINDINGS: imaging  COGNITION: Overall cognitive status: Impaired and very poor safety awareness and insight to his deficits   SENSATION: WFL  COORDINATION: RUE and RLE  limitations > LUE/LLE  EDEMA:  none  MUSCLE TONE: WNL  MUSCLE LENGTH:   DTRs:  NT  POSTURE: WFL  LOWER EXTREMITY ROM:     WNL  LOWER EXTREMITY MMT:    RLE 3/5 gross stretngth LLE 4/5 gross strength  BED MOBILITY:  NT  TRANSFERS: Assistive device utilized: Single point cane  Sit to stand: Min A Stand to sit: Min A Chair to chair: Min A Floor:  NT  RAMP:  Level of Assistance: Min A Assistive device utilized: Single point cane Ramp Comments:   CURB:  Level of Assistance: Min A Assistive device utilized: Single point cane Curb Comments:   STAIRS: Level of Assistance: Min A Stair Negotiation Technique: Step to Pattern with Single Rail on Left Number of Stairs: 4  Height of Stairs: 4-6  Comments:   GAIT: Gait pattern:  unsteady throughout right LOB and wide BOS Distance walked: 75 Assistive device utilized: Single point cane Level of assistance: Min A Comments: very unsteady, recommend use of RW vs cane--pt resistant to this suggestion  FUNCTIONAL TESTS:  5 times sit to stand: 51.35 sec w/ min A Timed up and go (TUG): 25.15 sec Berg Balance Scale: NT  PATIENT SURVEYS:       PATIENT EDUCATION: Education details: recommend use of RW vs cane due to unsteadiness and high risk for falls Person educated: Patient and Spouse Education method: Explanation Education comprehension:  pt is resistant to this recommendation  HOME EXERCISE PROGRAM: TBD  GOALS: Goals reviewed with patient? Yes  SHORT TERM GOALS: Target date: 01/11/2022    Patient will be independent in HEP to improve functional outcomes Baseline: Goal status: IN PROGRESS  2.  Demo functional  transfers with supervision and good safety awareness requiring only occasional cues in sequence and safety Baseline: min A Goal status: IN PROGRESS  3.  Demo improved safety in functional mobility demonstrating supervision performance x 150 ft w/out LOB in order to reduce risk for  falls Baseline: Min A w/ cane Goal status: IN PROGRESS  4.  Demo improved safety with functional mobility as evidenced by time 15 sec TUG test using least restrictive AD Baseline: 25 sec w/ min A and cane Goal status: IN PROGRESS    LONG TERM GOALS: Target date: 02/15/2022    Ambulate x 300 ft w/ least restrictive AD and modified independence on level surfaces, curb negotiation, ramp, and stair ambulation to improve safety in functional mobility and reduce need for caregiver assistance Baseline: Min A Goal status: IN PROGRESS  2.  Manifest improved BLE strength and dynamic balance as evidenced by time 15 sec 5xSTS Baseline: 51 sec requiring min A to complete Goal status: IN PROGRESS  3.  Perform functional transfers with modified independence and good safety awareness to reduce risk for falls Baseline: Min A, poor safety Goal status: IN PROGRESS  4.  Demo low risk for falls per time 12 seconds TUG test Baseline: 25+ w/ Min A required Goal status: IN PROGRESS    ASSESSMENT:  CLINICAL IMPRESSION: Patient arrived to session with report of continued LBP- wife reports that this has been ongoing since April. Also admits to a fall yesterday without injury. Worked on STS transfers from elevated surface with consistent cueing for continued movement. Patient noted dizziness and became lethargic with increased reps. Vitals showed bradycardia and elevated systolic BP. No other problematic signs or symptoms. Wife reports that this usually happens when his pain levels go up. Instructed on gentle breathing- vitals unchanged. Educated patient and wife to continue to monitor BP throughout the day. Educated on calling 911 if red flag symptoms occur. Ended session early.   OBJECTIVE IMPAIRMENTS: Abnormal gait, decreased activity tolerance, decreased balance, decreased coordination, decreased endurance, decreased knowledge of use of DME, decreased mobility, difficulty walking, decreased strength,  decreased safety awareness, improper body mechanics, and pain.   ACTIVITY LIMITATIONS: carrying, lifting, bending, standing, squatting, sleeping, stairs, transfers, toileting, dressing, reach over head, and locomotion level  PARTICIPATION LIMITATIONS: meal prep, cleaning, laundry, shopping, community activity, occupation, and yard work  PERSONAL FACTORS: Age, Time since onset of injury/illness/exacerbation, and 3+ comorbidities: multiple systems with numerous medical and physical co-morbidities  are also affecting patient's functional outcome.   REHAB POTENTIAL: Fair due to interaction of conditions  CLINICAL DECISION MAKING: Evolving/moderate complexity  EVALUATION COMPLEXITY: Moderate  PLAN:  PT FREQUENCY: 1-2x/week  PT DURATION: 8 weeks  PLANNED INTERVENTIONS: Therapeutic exercises, Therapeutic activity, Neuromuscular re-education, Balance training, Gait training, Patient/Family education, Self Care, Joint mobilization, Stair training, Vestibular training, Canalith repositioning, Orthotic/Fit training, DME instructions, Aquatic Therapy, Dry Needling, Electrical stimulation, Spinal mobilization, Cryotherapy, Moist heat, Splintting, Taping, Traction, Ionotophoresis '4mg'$ /ml Dexamethasone, and Manual therapy  PLAN FOR NEXT SESSION: monitor vitals; transfer from elevated seat   Janene Harvey, PT, DPT 01/03/22 9:32 AM  [place] [facility] [address] [place], [place] [address] Phone # [phone] Fax # [phone]

## 2022-01-04 ENCOUNTER — Ambulatory Visit: Payer: Medicare Other | Admitting: Occupational Therapy

## 2022-01-04 ENCOUNTER — Ambulatory Visit: Payer: Medicare Other | Admitting: Physical Therapy

## 2022-01-05 DIAGNOSIS — N2581 Secondary hyperparathyroidism of renal origin: Secondary | ICD-10-CM | POA: Diagnosis not present

## 2022-01-05 DIAGNOSIS — D631 Anemia in chronic kidney disease: Secondary | ICD-10-CM | POA: Diagnosis not present

## 2022-01-05 DIAGNOSIS — N186 End stage renal disease: Secondary | ICD-10-CM | POA: Diagnosis not present

## 2022-01-05 DIAGNOSIS — D509 Iron deficiency anemia, unspecified: Secondary | ICD-10-CM | POA: Diagnosis not present

## 2022-01-05 DIAGNOSIS — Z992 Dependence on renal dialysis: Secondary | ICD-10-CM | POA: Diagnosis not present

## 2022-01-08 DIAGNOSIS — N186 End stage renal disease: Secondary | ICD-10-CM | POA: Diagnosis not present

## 2022-01-08 DIAGNOSIS — D631 Anemia in chronic kidney disease: Secondary | ICD-10-CM | POA: Diagnosis not present

## 2022-01-08 DIAGNOSIS — D509 Iron deficiency anemia, unspecified: Secondary | ICD-10-CM | POA: Diagnosis not present

## 2022-01-08 DIAGNOSIS — Z992 Dependence on renal dialysis: Secondary | ICD-10-CM | POA: Diagnosis not present

## 2022-01-08 DIAGNOSIS — N2581 Secondary hyperparathyroidism of renal origin: Secondary | ICD-10-CM | POA: Diagnosis not present

## 2022-01-09 ENCOUNTER — Other Ambulatory Visit: Payer: Self-pay | Admitting: Family

## 2022-01-10 DIAGNOSIS — Z992 Dependence on renal dialysis: Secondary | ICD-10-CM | POA: Diagnosis not present

## 2022-01-10 DIAGNOSIS — N2581 Secondary hyperparathyroidism of renal origin: Secondary | ICD-10-CM | POA: Diagnosis not present

## 2022-01-10 DIAGNOSIS — N186 End stage renal disease: Secondary | ICD-10-CM | POA: Diagnosis not present

## 2022-01-10 DIAGNOSIS — D509 Iron deficiency anemia, unspecified: Secondary | ICD-10-CM | POA: Diagnosis not present

## 2022-01-10 DIAGNOSIS — D631 Anemia in chronic kidney disease: Secondary | ICD-10-CM | POA: Diagnosis not present

## 2022-01-10 NOTE — Therapy (Signed)
OUTPATIENT PHYSICAL THERAPY NEURO TREATMENT   Patient Name: Kirk [name] MRN: [id number] DOB:02-19-1960, 61 y.o., male Today's Date: 01/11/2022   PCP: Sandrea Hughs, NP REFERRING PROVIDER: Consuella Lose, MD  END OF SESSION:  PT End of Session - 01/11/22 1443     Visit Number 3    Number of Visits 12    Date for PT Re-Evaluation 02/15/22    Authorization Type Medicare A&B    Progress Note Due on Visit 10    PT Start Time 6378    PT Stop Time 1438    PT Time Calculation (min) 39 min    Equipment Utilized During Treatment Gait belt    Activity Tolerance Patient tolerated treatment well;Other (comment)   lethargy, dizziness   Behavior During Therapy Flat affect               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)    Anxiety    Asthma    Asthma    moderate persistent   Carpal tunnel syndrome    Cellulitis and abscess of trunk    Cholelithiasis 07/13/2014   Chronic headaches    Debility, unspecified    Dermatophytosis of the body    Dysrhythmia    history of   Edema    End stage renal disease on dialysis [facility])    "MWF; [facility]" (10/21/2014)   Essential hypertension, benign    GERD (gastroesophageal reflux disease)    Gout, unspecified    HTN (hypertension)    Hypertrophy of prostate without urinary obstruction and other lower urinary tract symptoms (LUTS)    Hypotension, unspecified    Impotence of organic origin    Insomnia, unspecified    Kidney replaced by transplant    Localization-related (focal) (partial) epilepsy and epileptic syndromes with complex partial seizures, without mention of intractable epilepsy    12-15-19- Wife states he has NEVER had a seizure    Lumbago    Memory loss    OSA on CPAP    Other and unspecified hyperlipidemia    controlled /managed per  wife    Other chronic nonalcoholic liver disease    Other malaise and fatigue    Other nonspecific abnormal serum enzyme levels    Pain in joint, lower leg    Pain in joint, upper arm    Pneumonia "several times"   PONV (postoperative nausea and vomiting)    Renal dialysis status(V45.11) 02/05/2010   restarted 01/02/13 ofter renal trransplant failure   Secondary hyperparathyroidism (of renal origin)    Shortness of breath    Sleep apnea    wears cpap    Tension headache    Unspecified constipation    Unspecified essential hypertension    Unspecified hereditary and idiopathic peripheral neuropathy    Unspecified vitamin D deficiency    Past Surgical History:  Procedure Laterality Date   AV FISTULA PLACEMENT Left ?2010   "forearm; at [place]"   [place]  03/21/2011   CHOLECYSTECTOMY N/A 10/21/2014   Procedure: LAPAROSCOPIC CHOLECYSTECTOMY WITH INTRAOPERATIVE CHOLANGIOGRAM;  Surgeon: Autumn Messing III, MD;  Location: [place];  Service: General;  Laterality: N/A;   COLONOSCOPY     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   IR ANGIO  INTRA EXTRACRAN SEL INTERNAL CAROTID UNI R MOD SED  11/30/2021   IR ANGIOGRAM FOLLOW UP STUDY  11/30/2021   IR NEURO EACH ADD'L AFTER BASIC UNI RIGHT (MS)  11/30/2021   IR TRANSCATH/EMBOLIZ  11/30/2021   KIDNEY TRANSPLANT  08/17/2011   [facility]    LAPAROSCOPIC CHOLECYSTECTOMY  10/21/2014   w/IOC   LEFT HEART CATHETERIZATION WITH CORONARY ANGIOGRAM N/A 03/21/2011   Procedure: LEFT HEART CATHETERIZATION WITH CORONARY ANGIOGRAM;  Surgeon: [name], MD;  Location: [facility] CATH LAB;  Service: Cardiovascular;  Laterality: N/A;   NEPHRECTOMY  08/2013   removed transplaned kidney   POLYPECTOMY     POSTERIOR FUSION CERVICAL SPINE  06/25/2012   for spinal stenosis   RADIOLOGY WITH ANESTHESIA N/A 11/30/2021   Procedure: RIGHT MIDDLE MENINGEAL ARTERY EMBOLIZATION;  Surgeon: Radiologist, Medication, MD;  Location:  [place];  Service: Radiology;  Laterality: N/A;   VASECTOMY  2010   Patient Active Problem List   Diagnosis Date Noted   Cerebral aneurysm 11/30/2021   Subdural hematoma ([place]) 11/30/2021   Traumatic subdural hematoma (SDH) (HCC) 11/21/2021   SDH (subdural hematoma) ([place]) 11/08/2021   Seizure ([place]) 11/08/2021   Chronic left shoulder pain 09/21/2021   Chronic right shoulder pain 09/21/2021   Cough with hemoptysis 08/01/2021   Preop respiratory exam 03/02/2021   Intractable nausea and vomiting 01/09/2021   Abnormal CT of the chest 05/24/2020   Rotator cuff syndrome of right shoulder 77/82/4235   Chronic systolic heart failure ([place]) 09/25/2018   Fluid overload 09/17/2018   Hypertensive urgency 09/17/2018   Right knee pain 06/02/2018   Right tibial fracture 06/02/2018   Cigarette smoker 07/11/2017   Anxiety 05/30/2017   Hypokalemia    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   Shunt malfunction 07/03/2016   Need for hepatitis C screening test 06/29/2015   Myalgia 05/17/2015   Secondary central sleep apnea 12/09/2014   Obesity hypoventilation syndrome ([place]) 12/09/2014   Narcotic drug use 12/09/2014   Hypersomnia with sleep apnea 12/09/2014   SCC (squamous cell carcinoma), arm 11/09/2014   Mild persistent chronic asthma without complication 36/14/4315   ESRD on dialysis ([place]) 09/09/2014   Essential hypertension 09/09/2014   HLD (hyperlipidemia) 09/09/2014   Pancytopenia ([place]) 04/28/2014   Thrombocytopenia ([place]) 04/10/2014   Fungal dermatitis 03/17/2014   S/p nephrectomy 09/17/2013   Anemia 09/03/2012   Uncontrolled hypertension    Tension headache    Memory loss    Edema    Thoracic or lumbosacral neuritis or radiculitis 03/27/2012   Nerve root pain 03/27/2012   History of kidney transplant 09/10/2011   Type 2 diabetes mellitus with diabetic nephropathy, without long-term current use of insulin ([place]) 08/30/2011   Hearing loss 08/16/2011   Obesity 08/16/2011   OSA on CPAP 08/05/2011    GIB (gastrointestinal bleeding) 10/28/2007    ONSET DATE: October 2023  REFERRING DIAG: S06.5X1A (ICD-10-CM) - Traumatic subdural hematoma with loss of consciousness of 30 minutes or less, initial encounter ([place]) I67.1 (ICD-10-CM) - Cerebral aneurysm  THERAPY DIAG:  Muscle weakness (generalized)  Unsteadiness on feet  Other symptoms and signs involving the musculoskeletal system  Difficulty in walking, not elsewhere classified  Other abnormalities of gait and mobility  Rationale for Evaluation and Treatment: Rehabilitation  SUBJECTIVE:  SUBJECTIVE STATEMENT: Wife reports that he missed the bottom step and fell on his bottom about a week ago. Denies injury. Patient reports that he is trying to walk without a cane and took a muscle relaxer.  Pt accompanied by: significant other  PERTINENT HISTORY: 61 y.o. male whose history initially begins approximately 1 month ago when he presented to the emergency department with seizure.  His imaging revealed a small acute right convexity subdural hematoma.  At the time he did not require operative intervention.  Patient has subsequently presented with headache and further seizures which have been managed medically.  Repeat CT scan has demonstrated enlargement of the now subacute/chronic right frontal convexity subdural hematoma, with slightly worsened local mass effect and midline shift.    PAIN:  Are you having pain? Yes: NPRS scale: past 10/10 Pain location: lumbar spine Pain description: sore/ache/stab Aggravating factors: sitting too long Relieving factors: nothing  PRECAUTIONS: Fall  WEIGHT BEARING RESTRICTIONS: No  FALLS: Has patient fallen in last 6 months? Yes. Number of falls 1 big one and multiple falls backwards to chairs  LIVING  ENVIRONMENT: Lives with: lives with their family and lives with their spouse Lives in: House/apartment Stairs: Yes: Internal: 7 to landing + 4 steps; on right going up and bilateral but cannot reach both Has following equipment at home: Single point cane  PLOF: Independent with household mobility with device  PATIENT GOALS: "not be dependent on cane"  OBJECTIVE:      TODAY'S TREATMENT: 01/11/22 Activity Comments  Vitals at start of session  96% spO2, 65 bpm, 168/77 mmHg  elevated STS 5x sitting on airex Min A; instability upon standing up  Sitting prayer stretch with pball Poor tolerance d/t back pain   Supine LTR Very poor ability to control movements; assistance to control ROM    Supine clams 2x10 Good tolerance  Hooklying march 10x Limited eccentric control; cues to avoid valsalva   Gait without AD Poor control of stepping and min A required  gait with quad tip cane- 140 ft, 70 ft 1 episode of pt closing eyes/limited alertness and near fall requiring mod A; overall improved stability when using cane, however              HOME EXERCISE PROGRAM Last updated: 01/11/22 Access Code: [email or web address] URL: [email or web address] Date: 01/11/2022 Prepared by: [facility]  Exercises - Supine March  - 1 x daily - 5 x weekly - 2 sets - 10 reps - Bent Knee Fallouts  - 1 x daily - 5 x weekly - 2 sets - 10 reps - Supine Lower Trunk Rotation  - 1 x daily - 5 x weekly - 2 sets - 10 reps - Sit to Stand with Counter Support  - 1 x daily - 5 x weekly - 2 sets - 5 reps   PATIENT EDUCATION: Education details: HEP update; advised to continue using AD for all ambulation for max safety; provided info on quad tip cane  Person educated: Patient and Spouse Education method: Explanation, Demonstration, Tactile cues, Verbal cues, and Handouts Education comprehension: verbalized understanding and returned demonstration   Below measures were taken at  time of initial evaluation unless otherwise specified:   DIAGNOSTIC FINDINGS: imaging  COGNITION: Overall cognitive status: Impaired and very poor safety awareness and insight to his deficits   SENSATION: WFL  COORDINATION: RUE and RLE limitations > LUE/LLE  EDEMA:  none  MUSCLE TONE: WNL  MUSCLE LENGTH:   DTRs:  NT  POSTURE: WFL  LOWER EXTREMITY ROM:     WNL  LOWER EXTREMITY MMT:    RLE 3/5 gross stretngth LLE 4/5 gross strength  BED MOBILITY:  NT  TRANSFERS: Assistive device utilized: Single point cane  Sit to stand: Min A Stand to sit: Min A Chair to chair: Min A Floor:  NT  RAMP:  Level of Assistance: Min A Assistive device utilized: Single point cane Ramp Comments:   CURB:  Level of Assistance: Min A Assistive device utilized: Single point cane Curb Comments:   STAIRS: Level of Assistance: Min A Stair Negotiation Technique: Step to Pattern with Single Rail on Left Number of Stairs: 4  Height of Stairs: 4-6  Comments:   GAIT: Gait pattern:  unsteady throughout right LOB and wide BOS Distance walked: 75 Assistive device utilized: Single point cane Level of assistance: Min A Comments: very unsteady, recommend use of RW vs cane--pt resistant to this suggestion  FUNCTIONAL TESTS:  5 times sit to stand: 51.35 sec w/ min A Timed up and go (TUG): 25.15 sec Berg Balance Scale: NT  PATIENT SURVEYS:       PATIENT EDUCATION: Education details: recommend use of RW vs cane due to unsteadiness and high risk for falls Person educated: Patient and Spouse Education method: Explanation Education comprehension:  pt is resistant to this recommendation  HOME EXERCISE PROGRAM: TBD  GOALS: Goals reviewed with patient? Yes  SHORT TERM GOALS: Target date: 01/11/2022    Patient will be independent in HEP to improve functional outcomes Baseline: Goal status: IN PROGRESS  2.  Demo functional transfers with supervision and good safety  awareness requiring only occasional cues in sequence and safety Baseline: min A Goal status: IN PROGRESS  3.  Demo improved safety in functional mobility demonstrating supervision performance x 150 ft w/out LOB in order to reduce risk for falls Baseline: Min A w/ cane Goal status: IN PROGRESS  4.  Demo improved safety with functional mobility as evidenced by time 15 sec TUG test using least restrictive AD Baseline: 25 sec w/ min A and cane Goal status: IN PROGRESS    LONG TERM GOALS: Target date: 02/15/2022    Ambulate x 300 ft w/ least restrictive AD and modified independence on level surfaces, curb negotiation, ramp, and stair ambulation to improve safety in functional mobility and reduce need for caregiver assistance Baseline: Min A Goal status: IN PROGRESS  2.  Manifest improved BLE strength and dynamic balance as evidenced by time 15 sec 5xSTS Baseline: 51 sec requiring min A to complete Goal status: IN PROGRESS  3.  Perform functional transfers with modified independence and good safety awareness to reduce risk for falls Baseline: Min A, poor safety Goal status: IN PROGRESS  4.  Demo low risk for falls per time 12 seconds TUG test Baseline: 25+ w/ Min A required Goal status: IN PROGRESS    ASSESSMENT:  CLINICAL IMPRESSION: Patient arrived to session ambulating with wife's support with poor stability without AD. Reports that he is trying to go without the cane today. Vitals revealed elevated BP but [facility] for exercise. Patient still quite limited in tolerance for STS activities. Opted for supine activities d/t patient reporting better tolerance for this position. Patient with limited core stability and eccentric control with activities; required min- mod A for sidelying to sit. Gait training with cane required PT assist for max stability and preventing LOB. Ended session early d/t c/o continued LBP.   OBJECTIVE IMPAIRMENTS: Abnormal gait, decreased activity  tolerance,  decreased balance, decreased coordination, decreased endurance, decreased knowledge of use of DME, decreased mobility, difficulty walking, decreased strength, decreased safety awareness, improper body mechanics, and pain.   ACTIVITY LIMITATIONS: carrying, lifting, bending, standing, squatting, sleeping, stairs, transfers, toileting, dressing, reach over head, and locomotion level  PARTICIPATION LIMITATIONS: meal prep, cleaning, laundry, shopping, community activity, occupation, and yard work  PERSONAL FACTORS: Age, Time since onset of injury/illness/exacerbation, and 3+ comorbidities: multiple systems with numerous medical and physical co-morbidities  are also affecting patient's functional outcome.   REHAB POTENTIAL: Fair due to interaction of conditions  CLINICAL DECISION MAKING: Evolving/moderate complexity  EVALUATION COMPLEXITY: Moderate  PLAN:  PT FREQUENCY: 1-2x/week  PT DURATION: 8 weeks  PLANNED INTERVENTIONS: Therapeutic exercises, Therapeutic activity, Neuromuscular re-education, Balance training, Gait training, Patient/Family education, Self Care, Joint mobilization, Stair training, Vestibular training, Canalith repositioning, Orthotic/Fit training, DME instructions, Aquatic Therapy, Dry Needling, Electrical stimulation, Spinal mobilization, Cryotherapy, Moist heat, Splintting, Taping, Traction, Ionotophoresis '4mg'$ /ml Dexamethasone, and Manual therapy  PLAN FOR NEXT SESSION: monitor vitals; transfer from elevated seat   Janene Harvey, PT, DPT 01/11/22 2:45 PM  [facility] at [facility] [address] [place], [place] [address] Phone # [phone] Fax # [phone]

## 2022-01-11 ENCOUNTER — Encounter: Payer: Self-pay | Admitting: Physical Therapy

## 2022-01-11 ENCOUNTER — Ambulatory Visit: Payer: Medicare Other | Admitting: Physical Therapy

## 2022-01-11 ENCOUNTER — Ambulatory Visit: Payer: Medicare Other | Admitting: Occupational Therapy

## 2022-01-11 DIAGNOSIS — M6281 Muscle weakness (generalized): Secondary | ICD-10-CM

## 2022-01-11 DIAGNOSIS — R2689 Other abnormalities of gait and mobility: Secondary | ICD-10-CM

## 2022-01-11 DIAGNOSIS — R262 Difficulty in walking, not elsewhere classified: Secondary | ICD-10-CM | POA: Diagnosis not present

## 2022-01-11 DIAGNOSIS — R41841 Cognitive communication deficit: Secondary | ICD-10-CM | POA: Diagnosis not present

## 2022-01-11 DIAGNOSIS — R29898 Other symptoms and signs involving the musculoskeletal system: Secondary | ICD-10-CM | POA: Diagnosis not present

## 2022-01-11 DIAGNOSIS — R2681 Unsteadiness on feet: Secondary | ICD-10-CM

## 2022-01-11 DIAGNOSIS — R41842 Visuospatial deficit: Secondary | ICD-10-CM
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
## 2022-01-12 DIAGNOSIS — Z992 Dependence on renal dialysis: Secondary | ICD-10-CM | POA: Diagnosis not present

## 2022-01-12 DIAGNOSIS — N2581 Secondary hyperparathyroidism of renal origin: Secondary | ICD-10-CM | POA: Diagnosis not present

## 2022-01-12 DIAGNOSIS — D509 Iron deficiency anemia, unspecified: Secondary | ICD-10-CM | POA: Diagnosis not present

## 2022-01-12 DIAGNOSIS — D631 Anemia in chronic kidney disease: Secondary | ICD-10-CM | POA: Diagnosis not present

## 2022-01-12 DIAGNOSIS — N186 End stage renal disease: Secondary | ICD-10-CM | POA: Diagnosis not present

## 2022-01-14 DIAGNOSIS — N186 End stage renal disease: Secondary | ICD-10-CM | POA: Diagnosis not present

## 2022-01-14 DIAGNOSIS — N2581 Secondary hyperparathyroidism of renal origin: Secondary | ICD-10-CM | POA: Diagnosis not present

## 2022-01-14 DIAGNOSIS — D509 Iron deficiency anemia, unspecified: Secondary | ICD-10-CM | POA: Diagnosis not present

## 2022-01-14 DIAGNOSIS — Z992 Dependence on renal dialysis: Secondary | ICD-10-CM | POA: Diagnosis not present

## 2022-01-14 DIAGNOSIS — D631 Anemia in chronic kidney disease: Secondary | ICD-10-CM | POA: Diagnosis not present

## 2022-01-17 DIAGNOSIS — D631 Anemia in chronic kidney disease: Secondary | ICD-10-CM | POA: Diagnosis not present

## 2022-01-17 DIAGNOSIS — N186 End stage renal disease: Secondary | ICD-10-CM | POA: Diagnosis not present

## 2022-01-17 DIAGNOSIS — D509 Iron deficiency anemia, unspecified: Secondary | ICD-10-CM | POA: Diagnosis not present

## 2022-01-17 DIAGNOSIS — N2581 Secondary hyperparathyroidism of renal origin: Secondary | ICD-10-CM | POA: Diagnosis not present

## 2022-01-17 DIAGNOSIS — Z992 Dependence on renal dialysis: Secondary | ICD-10-CM | POA: Diagnosis not present

## 2022-01-18 ENCOUNTER — Other Ambulatory Visit: Payer: Self-pay | Admitting: *Deleted

## 2022-01-18 ENCOUNTER — Other Ambulatory Visit: Payer: Self-pay | Admitting: Nurse Practitioner

## 2022-01-18 ENCOUNTER — Ambulatory Visit: Payer: Medicare Other

## 2022-01-18 ENCOUNTER — Ambulatory Visit: Payer: Medicare Other | Admitting: Occupational Therapy

## 2022-01-18 ENCOUNTER — Other Ambulatory Visit: Payer: Self-pay | Admitting: Family

## 2022-01-18 VITALS — BP 182/83 | HR 57

## 2022-01-18 DIAGNOSIS — R41841 Cognitive communication deficit: Secondary | ICD-10-CM

## 2022-01-18 DIAGNOSIS — R41842 Visuospatial deficit: Secondary | ICD-10-CM

## 2022-01-18 DIAGNOSIS — M5412 Radiculopathy, cervical region: Secondary | ICD-10-CM

## 2022-01-18 DIAGNOSIS — R262 Difficulty in walking, not elsewhere classified: Secondary | ICD-10-CM

## 2022-01-18 DIAGNOSIS — R2681 Unsteadiness on feet: Secondary | ICD-10-CM

## 2022-01-18 DIAGNOSIS — R2689 Other abnormalities of gait and mobility: Secondary | ICD-10-CM

## 2022-01-18 DIAGNOSIS — F339 Major depressive disorder, recurrent, unspecified: Secondary | ICD-10-CM

## 2022-01-18 DIAGNOSIS — M6281 Muscle weakness (generalized): Secondary | ICD-10-CM

## 2022-01-18 DIAGNOSIS — R29898 Other symptoms and signs involving the musculoskeletal system: Secondary | ICD-10-CM | POA: Diagnosis not present

## 2022-01-18 DIAGNOSIS — I10 Essential (primary) hypertension: Secondary | ICD-10-CM

## 2022-01-18 MED ORDER — METOPROLOL TARTRATE 100 MG PO TABS: 100.0000 mg | ORAL_TABLET | Freq: Two times a day (BID) | ORAL | 1 refills | Status: DC

## 2022-01-18 MED ORDER — OXYCODONE HCL 5 MG PO TABS: 5.0000 mg | ORAL_TABLET | ORAL | 0 refills | Status: DC | PRN

## 2022-01-18 NOTE — Telephone Encounter (Signed)
Will need visit to sign narcotic use contract as soon as possible or follow up with pain management.
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
## 2022-01-18 NOTE — Telephone Encounter (Signed)
Patient is requesting medication Zofran that's not on medication list. Medication pend and sent to PCP [place], Nelda Bucks, NP for approval.

## 2022-01-18 NOTE — Telephone Encounter (Signed)
Pharmacy requested refill.  ?Pended Rx and sent to Dinah for approval.  ?

## 2022-01-18 NOTE — Therapy (Signed)
OUTPATIENT SPEECH LANGUAGE PATHOLOGY TREATMENT   Patient Name: Kirk [name] MRN: [id number] DOB:04/01/1960, 61 y.o., male Today's Date: 01/18/2022  PCP: Sandrea Hughs, NP REFERRING PROVIDER: Consuella Lose, MD  END OF SESSION:  End of Session - 01/18/22 1728     Visit Number 2    Number of Visits 25    Date for SLP Re-Evaluation 04/02/22    SLP Start Time 0850    SLP Stop Time  0931    SLP Time Calculation (min) 41 min    Activity Tolerance Patient limited by pain              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)    Anxiety    Asthma    Asthma    moderate persistent   Carpal tunnel syndrome    Cellulitis and abscess of trunk    Cholelithiasis 07/13/2014   Chronic headaches    Debility, unspecified    Dermatophytosis of the body    Dysrhythmia    history of   Edema    End stage renal disease on dialysis ([place])    "MWF; [facility] in [place]" (10/21/2014)   Essential hypertension, benign    GERD (gastroesophageal reflux disease)    Gout, unspecified    HTN (hypertension)    Hypertrophy of prostate without urinary obstruction and other lower urinary tract symptoms (LUTS)    Hypotension, unspecified    Impotence of organic origin    Insomnia, unspecified    Kidney replaced by transplant    Localization-related (focal) (partial) epilepsy and epileptic syndromes with complex partial seizures, without mention of intractable epilepsy    12-15-19- Wife states he has NEVER had a seizure    Lumbago    Memory loss    OSA on CPAP    Other and unspecified hyperlipidemia    controlled /managed per wife    Other chronic nonalcoholic liver disease    Other malaise and fatigue    Other nonspecific abnormal serum enzyme levels    Pain in joint, lower leg    Pain in joint, upper arm    Pneumonia  "several times"   PONV (postoperative nausea and vomiting)    Renal dialysis status(V45.11) 02/05/2010   restarted 01/02/13 ofter renal trransplant failure   Secondary hyperparathyroidism (of renal origin)    Shortness of breath    Sleep apnea    wears cpap    Tension headache    Unspecified constipation    Unspecified essential hypertension    Unspecified hereditary and idiopathic peripheral neuropathy    Unspecified vitamin D deficiency    Past Surgical History:  Procedure Laterality Date   AV FISTULA PLACEMENT Left ?2010   "forearm; at [facility]"   [place]  03/21/2011   CHOLECYSTECTOMY N/A 10/21/2014   Procedure: LAPAROSCOPIC CHOLECYSTECTOMY WITH INTRAOPERATIVE CHOLANGIOGRAM;  Surgeon: Autumn Messing III, MD;  Location: [facility];  Service: General;  Laterality: N/A;   COLONOSCOPY     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   IR ANGIO INTRA EXTRACRAN SEL INTERNAL CAROTID UNI R MOD SED  11/30/2021   IR ANGIOGRAM FOLLOW UP STUDY  11/30/2021   IR NEURO EACH ADD'L AFTER BASIC UNI RIGHT (MS)  11/30/2021   IR TRANSCATH/EMBOLIZ  11/30/2021  KIDNEY TRANSPLANT  08/17/2011   [facility]    LAPAROSCOPIC CHOLECYSTECTOMY  10/21/2014   w/IOC   LEFT HEART CATHETERIZATION WITH CORONARY ANGIOGRAM N/A 03/21/2011   Procedure: LEFT HEART CATHETERIZATION WITH CORONARY ANGIOGRAM;  Surgeon: [name], MD;  Location: [facility] CATH LAB;  Service: Cardiovascular;  Laterality: N/A;   NEPHRECTOMY  08/2013   removed transplaned kidney   POLYPECTOMY     POSTERIOR FUSION CERVICAL SPINE  06/25/2012   for spinal stenosis   RADIOLOGY WITH ANESTHESIA N/A 11/30/2021   Procedure: RIGHT MIDDLE MENINGEAL ARTERY EMBOLIZATION;  Surgeon: Radiologist, Medication, MD;  Location: [facility];  Service: Radiology;  Laterality: N/A;   VASECTOMY  2010   Patient Active Problem List   Diagnosis Date Noted   Cerebral aneurysm 11/30/2021   Subdural hematoma ([place]) 11/30/2021    Traumatic subdural hematoma (SDH) ([facility]) 11/21/2021   SDH (subdural hematoma) ([place]) 11/08/2021   Seizure ([place]) 11/08/2021   Chronic left shoulder pain 09/21/2021   Chronic right shoulder pain 09/21/2021   Cough with hemoptysis 08/01/2021   Preop respiratory exam 03/02/2021   Intractable nausea and vomiting 01/09/2021   Abnormal CT of the chest 05/24/2020   Rotator cuff syndrome of right shoulder 95/28/4132   Chronic systolic heart failure ([place]) 09/25/2018   Fluid overload 09/17/2018   Hypertensive urgency 09/17/2018   Right knee pain 06/02/2018   Right tibial fracture 06/02/2018   Cigarette smoker 07/11/2017   Anxiety 05/30/2017   Hypokalemia    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   Shunt malfunction 07/03/2016   Need for hepatitis C screening test 06/29/2015   Myalgia 05/17/2015   Secondary central sleep apnea 12/09/2014   Obesity hypoventilation syndrome ([place]) 12/09/2014   Narcotic drug use 12/09/2014   Hypersomnia with sleep apnea 12/09/2014   SCC (squamous cell carcinoma), arm 11/09/2014   Mild persistent chronic asthma without complication 44/01/270   ESRD on dialysis ([place]) 09/09/2014   Essential hypertension 09/09/2014   HLD (hyperlipidemia) 09/09/2014   Pancytopenia ([place]) 04/28/2014   Thrombocytopenia ([place]) 04/10/2014   Fungal dermatitis 03/17/2014   S/p nephrectomy 09/17/2013   Anemia 09/03/2012   Uncontrolled hypertension    Tension headache    Memory loss    Edema    Thoracic or lumbosacral neuritis or radiculitis 03/27/2012   Nerve root pain 03/27/2012   History of kidney transplant 09/10/2011   Type 2 diabetes mellitus with diabetic nephropathy, without long-term current use of insulin ([place]) 08/30/2011   Hearing loss 08/16/2011   Obesity 08/16/2011   OSA on CPAP 08/05/2011   GIB (gastrointestinal bleeding) 10/28/2007    ONSET DATE: 11-07-21   REFERRING DIAG: Z36.5X1A (ICD-10-CM) - Traumatic subdural hematoma with loss of consciousness of 30 minutes or less,  initial encounter ([place]) I67.1 (ICD-10-CM) - Cerebral aneurysm  THERAPY DIAG:  Cognitive communication deficit  Rationale for Evaluation and Treatment: Rehabilitation  SUBJECTIVE:   SUBJECTIVE STATEMENT: Pt has gotten 3 1/2 hours sleep total in the last 4 nights.  Pt accompanied by: significant other Wife Mechele Claude  PERTINENT HISTORY: 1 month ago when he presented to the emergency department with seizure-like activity.  His imaging revealed a small acute right convexity subdural hematoma. Patient continued to c/o headache and further seizures which are now managed medically.  Repeat CT scan demonstrated enlargement of the now subacute/chronic right frontal convexity subdural hematoma, with slightly worsened local mass effect and midline shift.  Patient underwent middle meningeal artery embolization. Pt has consult pending for possible back sx.  PAIN:  Are you having  pain? Yes: NPRS scale: 10/10 Pain location: back Pain description: sore, sharp with certain movements  FALLS: Has patient fallen in last 6 months?  Yes. See PT eval.  LIVING ENVIRONMENT: Lives with: lives with their spouse Lives in: House/apartment  PLOF:  Level of assistance: Independent with ADLs  PATIENT GOALS: Better memory  OBJECTIVE:   DIAGNOSTIC FINDINGS:  DC from ST 11-30-21 (CIR) Clinical Impression/Discharge Summary: Patient has made functional gains and has met 3 of 4 long-term goals this admission. Pt limited by lethargy, fatigue, and ongoing headache. Pt is currently completing functional and basic-to-mildly complex cognitive tasks with supervision-to-min A verbal cues in regards to attention, problem solving, judgement/safety awareness, and functional recall. Patient's care partner is independent to provide the necessary physical and cognitive assistance at discharge. Patient would benefit from continued SLP services in home health setting to maximize cognitive-linguistic function and functional independence.     STANDARDIZED ASSESSMENTS: SLUMS: Pt scored 20/30 indicating "dementia" scoring range of 1-20. Notably, pt had difficulty with recall of 5 unrelated words (1/5) and details in an auditory discourse (3/4 questions correct), naming animals (13) , and placing hour markers on a clock face Cognitive Linguistic Quick Test (CLQT) started today. Will compete ASAP.  PATIENT REPORTED OUTCOME MEASURES (PROM): Cognitive Function: will be provided to both pt and wife   TODAY'S TREATMENT:                                                                                                                                         DATE:  01/18/22: CLQT initiated today. Pt with some challenges with impulsivity, intermittently. Pt has gotten 3 1/2 hours sleep total in the last 4 nights due to back pain, and this has occurred since spring. Mechele Claude assures SLP that doctors are doing what they can to control pt's pain but it is not successful at this time. SLP questions validity of pt's true cognitive status if he is receiving > average 1 hour sleep/night. SLP encouraged Mechele Claude to get a calendar and write appointment dates on it as she has not done this yet. CLQT to cont next session.   01/02/22: SLP discussed eval results with pt and wife and stated attention was thought to be some of the problem with pt's memory disorder/deficit, and that full cognitive evaluation (CLQT) would follow. SLP suggested to Mechele Claude that she should get a December calendar and write a schedule on it fo rpt to access PRN, and also that pt should set alarm in his phone for medication administration, with wife's help PRN.   PATIENT EDUCATION: Education details: see above in "today's treatment" Person educated: Patient and Spouse Education method: Explanation and Demonstration Education comprehension: verbalized understanding, verbal cues required, and needs further education   GOALS: Goals reviewed with patient? No  SHORT TERM GOALS: Target  date: 02/09/22  Pt will arrive to ST with memory compensation device 70% of sessions  Baseline: Goal status: Ongoing  2.  Pt will demo selective attention for simple therapy task in min noisy environment x 2 sessions Baseline:  Goal status: Ongoing  3.  Pt will complete full cognitive communication testing Baseline:  Goal status: Ongoing  4.  TBD PRN Baseline:  Goal status: Ongoing   LONG TERM GOALS: Target date: 04/02/22  Pt will score higher/better on PROM than initially Baseline:  Goal status: Ongoing  2.  [place] will demo adequate selective attention during a functional simple-mod complex task for 10 minutes, in 3 sessions Baseline:  Goal status: Ongoing  3.  [place] will utilize one memory strategy in 3 sessions, or between 3 sessions as reported by wife and/or pt Baseline:  Goal status: Ongoing  4.  TBD following evaluatoin Baseline:  Goal status: Ongoing   ASSESSMENT:  CLINICAL IMPRESSION: Patient is a 61 y.o. male who was seen today for cognitive deficits following seizure and SDH with embolization. He exhibits deficits in awareness attention, and memory, exhibited by decr'd ability to manage medication and manage appointments. Due to not receiving average >1 hour of sleep/night for multiple nights (at least since summer 2023 due to back pain) SLP questions pt's true cognitive function when administered this assessment tool.  OBJECTIVE IMPAIRMENTS: include attention, memory, awareness, and executive functioning. These impairments are limiting patient from managing medications, managing appointments, managing finances, household responsibilities, ADLs/IADLs, and effectively communicating at home and in community. Factors affecting potential to achieve goals and functional outcome are ability to learn/carryover information, cooperation/participation level, pain level, and severity of impairments.. Patient will benefit from skilled SLP services to address above impairments  and improve overall function.  REHAB POTENTIAL: Fair to good due to pain level . Consult for sx is pending.  PLAN:  SLP FREQUENCY: 2x/week  SLP DURATION: 8 weeks  PLANNED INTERVENTIONS: Language facilitation, Environmental controls, Cognitive reorganization, Internal/external aids, Oral motor exercises, Functional tasks, Multimodal communication approach, SLP instruction and feedback, and Patient/family education    [place], [place] 01/18/2022, 5:29 PM

## 2022-01-18 NOTE — Telephone Encounter (Signed)
Metoprolol refilled.

## 2022-01-18 NOTE — Therapy (Signed)
OUTPATIENT PHYSICAL THERAPY NEURO TREATMENT   Patient Name: Kirk [name] MRN: [id number] DOB:August 22, 1960, 61 y.o., male Today's Date: 01/18/2022   PCP: Sandrea Hughs, NP REFERRING PROVIDER: Consuella Lose, MD  END OF SESSION:  PT End of Session - 01/18/22 0944     Visit Number 4    Number of Visits 12    Date for PT Re-Evaluation 02/15/22    Authorization Type Medicare A&B    Progress Note Due on Visit 10    PT Start Time 0300    PT Stop Time 1100    PT Time Calculation (min) 45 min    Equipment Utilized During Treatment Gait belt    Activity Tolerance Patient tolerated treatment well;Other (comment)   lethargy, dizziness   Behavior During Therapy Flat affect               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)    Anxiety    Asthma    Asthma    moderate persistent   Carpal tunnel syndrome    Cellulitis and abscess of trunk    Cholelithiasis 07/13/2014   Chronic headaches    Debility, unspecified    Dermatophytosis of the body    Dysrhythmia    history of   Edema    End stage renal disease on dialysis [facility])    "MWF; [facility]" (10/21/2014)   Essential hypertension, benign    GERD (gastroesophageal reflux disease)    Gout, unspecified    HTN (hypertension)    Hypertrophy of prostate without urinary obstruction and other lower urinary tract symptoms (LUTS)    Hypotension, unspecified    Impotence of organic origin    Insomnia, unspecified    Kidney replaced by transplant    Localization-related (focal) (partial) epilepsy and epileptic syndromes with complex partial seizures, without mention of intractable epilepsy    12-15-19- Wife states he has NEVER had a seizure    Lumbago    Memory loss    OSA on CPAP    Other and unspecified hyperlipidemia    controlled /managed per  wife    Other chronic nonalcoholic liver disease    Other malaise and fatigue    Other nonspecific abnormal serum enzyme levels    Pain in joint, lower leg    Pain in joint, upper arm    Pneumonia "several times"   PONV (postoperative nausea and vomiting)    Renal dialysis status(V45.11) 02/05/2010   restarted 01/02/13 ofter renal trransplant failure   Secondary hyperparathyroidism (of renal origin)    Shortness of breath    Sleep apnea    wears cpap    Tension headache    Unspecified constipation    Unspecified essential hypertension    Unspecified hereditary and idiopathic peripheral neuropathy    Unspecified vitamin D deficiency    Past Surgical History:  Procedure Laterality Date   AV FISTULA PLACEMENT Left ?2010   "forearm; at [facility]"   [place]  03/21/2011   CHOLECYSTECTOMY N/A 10/21/2014   Procedure: LAPAROSCOPIC CHOLECYSTECTOMY WITH INTRAOPERATIVE CHOLANGIOGRAM;  Surgeon: Autumn Messing III, MD;  Location: [place];  Service: General;  Laterality: N/A;   COLONOSCOPY     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   IR ANGIO  INTRA EXTRACRAN SEL INTERNAL CAROTID UNI R MOD SED  11/30/2021   IR ANGIOGRAM FOLLOW UP STUDY  11/30/2021   IR NEURO EACH ADD'L AFTER BASIC UNI RIGHT (MS)  11/30/2021   IR TRANSCATH/EMBOLIZ  11/30/2021   KIDNEY TRANSPLANT  08/17/2011   [facility]    LAPAROSCOPIC CHOLECYSTECTOMY  10/21/2014   w/IOC   LEFT HEART CATHETERIZATION WITH CORONARY ANGIOGRAM N/A 03/21/2011   Procedure: LEFT HEART CATHETERIZATION WITH CORONARY ANGIOGRAM;  Surgeon: [name], MD;  Location: [facility] CATH LAB;  Service: Cardiovascular;  Laterality: N/A;   NEPHRECTOMY  08/2013   removed transplaned kidney   POLYPECTOMY     POSTERIOR FUSION CERVICAL SPINE  06/25/2012   for spinal stenosis   RADIOLOGY WITH ANESTHESIA N/A 11/30/2021   Procedure: RIGHT MIDDLE MENINGEAL ARTERY EMBOLIZATION;  Surgeon: Radiologist, Medication, MD;  Location:  [place];  Service: Radiology;  Laterality: N/A;   VASECTOMY  2010   Patient Active Problem List   Diagnosis Date Noted   Cerebral aneurysm 11/30/2021   Subdural hematoma ([place]) 11/30/2021   Traumatic subdural hematoma (SDH) (HCC) 11/21/2021   SDH (subdural hematoma) ([place]) 11/08/2021   Seizure ([place]) 11/08/2021   Chronic left shoulder pain 09/21/2021   Chronic right shoulder pain 09/21/2021   Cough with hemoptysis 08/01/2021   Preop respiratory exam 03/02/2021   Intractable nausea and vomiting 01/09/2021   Abnormal CT of the chest 05/24/2020   Rotator cuff syndrome of right shoulder 82/95/6213   Chronic systolic heart failure ([place]) 09/25/2018   Fluid overload 09/17/2018   Hypertensive urgency 09/17/2018   Right knee pain 06/02/2018   Right tibial fracture 06/02/2018   Cigarette smoker 07/11/2017   Anxiety 05/30/2017   Hypokalemia    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   Shunt malfunction 07/03/2016   Need for hepatitis C screening test 06/29/2015   Myalgia 05/17/2015   Secondary central sleep apnea 12/09/2014   Obesity hypoventilation syndrome ([place]) 12/09/2014   Narcotic drug use 12/09/2014   Hypersomnia with sleep apnea 12/09/2014   SCC (squamous cell carcinoma), arm 11/09/2014   Mild persistent chronic asthma without complication 08/65/7846   ESRD on dialysis ([place]) 09/09/2014   Essential hypertension 09/09/2014   HLD (hyperlipidemia) 09/09/2014   Pancytopenia ([place]) 04/28/2014   Thrombocytopenia ([place]) 04/10/2014   Fungal dermatitis 03/17/2014   S/p nephrectomy 09/17/2013   Anemia 09/03/2012   Uncontrolled hypertension    Tension headache    Memory loss    Edema    Thoracic or lumbosacral neuritis or radiculitis 03/27/2012   Nerve root pain 03/27/2012   History of kidney transplant 09/10/2011   Type 2 diabetes mellitus with diabetic nephropathy, without long-term current use of insulin ([place]) 08/30/2011   Hearing loss 08/16/2011   Obesity 08/16/2011   OSA on CPAP 08/05/2011    GIB (gastrointestinal bleeding) 10/28/2007    ONSET DATE: October 2023  REFERRING DIAG: S06.5X1A (ICD-10-CM) - Traumatic subdural hematoma with loss of consciousness of 30 minutes or less, initial encounter ([place]) I67.1 (ICD-10-CM) - Cerebral aneurysm  THERAPY DIAG:  Muscle weakness (generalized)  Unsteadiness on feet  Other symptoms and signs involving the musculoskeletal system  Difficulty in walking, not elsewhere classified  Other abnormalities of gait and mobility  Rationale for Evaluation and Treatment: Rehabilitation  SUBJECTIVE:  SUBJECTIVE STATEMENT: Difficulty with blood pressure regulation  Pt accompanied by: significant other  PERTINENT HISTORY: 61 y.o. male whose history initially begins approximately 1 month ago when he presented to the emergency department with seizure.  His imaging revealed a small acute right convexity subdural hematoma.  At the time he did not require operative intervention.  Patient has subsequently presented with headache and further seizures which have been managed medically.  Repeat CT scan has demonstrated enlargement of the now subacute/chronic right frontal convexity subdural hematoma, with slightly worsened local mass effect and midline shift.    PAIN:  Are you having pain? Yes: NPRS scale: past 10/10 Pain location: lumbar spine Pain description: sore/ache/stab Aggravating factors: sitting too long Relieving factors: nothing  PRECAUTIONS: Fall  WEIGHT BEARING RESTRICTIONS: No  FALLS: Has patient fallen in last 6 months? Yes. Number of falls 1 big one and multiple falls backwards to chairs  LIVING ENVIRONMENT: Lives with: lives with their family and lives with their spouse Lives in: House/apartment Stairs: Yes: Internal: 7 to landing + 4 steps; on  right going up and bilateral but cannot reach both Has following equipment at home: Single point cane  PLOF: Independent with household mobility with device  PATIENT GOALS: "not be dependent on cane"  OBJECTIVE:    TODAY'S TREATMENT: 01/18/22 Activity Comments  Vitals at start of session (sitting) 186/75, 56 bpm, 97% O2  Gait with RW -Adjustment of height, cues in sequence/step height -2MWT with RW x 180 ft -Gait with RW and over threshold and curb mgmt requiring CGA  HEP review -LTR x 2 min -supine march 10x--cues for ab engagement -bent knee fallout--cues for isolation and control  Mat table there ex -SAQ 10x, limited ROM and effort -hip add isometric 2x10 -sidelying clam shell 2x10  Vitals mid-session (supine) 174/88, 59 bpm         TODAY'S TREATMENT: 01/11/22 Activity Comments  Vitals at start of session  96% spO2, 65 bpm, 168/77 mmHg  elevated STS 5x sitting on airex Min A; instability upon standing up  Sitting prayer stretch with pball Poor tolerance d/t back pain   Supine LTR Very poor ability to control movements; assistance to control ROM    Supine clams 2x10 Good tolerance  Hooklying march 10x Limited eccentric control; cues to avoid valsalva   Gait without AD Poor control of stepping and min A required  gait with quad tip cane- 140 ft, 70 ft 1 episode of pt closing eyes/limited alertness and near fall requiring mod A; overall improved stability when using cane, however              HOME EXERCISE PROGRAM Last updated: 01/11/22 Access Code: [email or web address] URL: [email or web address] Date: 01/11/2022 Prepared by: [facility]  Exercises - Supine March  - 1 x daily - 5 x weekly - 2 sets - 10 reps - Bent Knee Fallouts  - 1 x daily - 5 x weekly - 2 sets - 10 reps - Supine Lower Trunk Rotation  - 1 x daily - 5 x weekly - 2 sets - 10 reps - Sit to Stand with Counter Support  - 1 x daily - 5 x weekly - 2 sets - 5  reps   PATIENT EDUCATION: Education details: HEP update; advised to continue using AD for all ambulation for max safety; provided info on quad tip cane  Person educated: Patient and Spouse Education method: Explanation, Demonstration, Tactile cues, Verbal cues, and Handouts Education  comprehension: verbalized understanding and returned demonstration   Below measures were taken at time of initial evaluation unless otherwise specified:   DIAGNOSTIC FINDINGS: imaging  COGNITION: Overall cognitive status: Impaired and very poor safety awareness and insight to his deficits   SENSATION: WFL  COORDINATION: RUE and RLE limitations > LUE/LLE  EDEMA:  none  MUSCLE TONE: WNL  MUSCLE LENGTH:   DTRs:  NT  POSTURE: WFL  LOWER EXTREMITY ROM:     WNL  LOWER EXTREMITY MMT:    RLE 3/5 gross stretngth LLE 4/5 gross strength  BED MOBILITY:  NT  TRANSFERS: Assistive device utilized: Single point cane  Sit to stand: Min A Stand to sit: Min A Chair to chair: Min A Floor:  NT  RAMP:  Level of Assistance: Min A Assistive device utilized: Single point cane Ramp Comments:   CURB:  Level of Assistance: Min A Assistive device utilized: Single point cane Curb Comments:   STAIRS: Level of Assistance: Min A Stair Negotiation Technique: Step to Pattern with Single Rail on Left Number of Stairs: 4  Height of Stairs: 4-6  Comments:   GAIT: Gait pattern:  unsteady throughout right LOB and wide BOS Distance walked: 75 Assistive device utilized: Single point cane Level of assistance: Min A Comments: very unsteady, recommend use of RW vs cane--pt resistant to this suggestion  FUNCTIONAL TESTS:  5 times sit to stand: 51.35 sec w/ min A Timed up and go (TUG): 25.15 sec Berg Balance Scale: NT  PATIENT SURVEYS:       PATIENT EDUCATION: Education details: recommend use of RW vs cane due to unsteadiness and high risk for falls Person educated: Patient and  Spouse Education method: Explanation Education comprehension:  pt is resistant to this recommendation  HOME EXERCISE PROGRAM: TBD  GOALS: Goals reviewed with patient? Yes  SHORT TERM GOALS: Target date: 01/11/2022    Patient will be independent in HEP to improve functional outcomes Baseline: Goal status: IN PROGRESS  2.  Demo functional transfers with supervision and good safety awareness requiring only occasional cues in sequence and safety Baseline: min A Goal status: IN PROGRESS  3.  Demo improved safety in functional mobility demonstrating supervision performance x 150 ft w/out LOB in order to reduce risk for falls Baseline: Min A w/ cane Goal status: IN PROGRESS  4.  Demo improved safety with functional mobility as evidenced by time 15 sec TUG test using least restrictive AD Baseline: 25 sec w/ min A and cane Goal status: IN PROGRESS    LONG TERM GOALS: Target date: 02/15/2022    Ambulate x 300 ft w/ least restrictive AD and modified independence on level surfaces, curb negotiation, ramp, and stair ambulation to improve safety in functional mobility and reduce need for caregiver assistance Baseline: Min A Goal status: IN PROGRESS  2.  Manifest improved BLE strength and dynamic balance as evidenced by time 15 sec 5xSTS Baseline: 51 sec requiring min A to complete Goal status: IN PROGRESS  3.  Perform functional transfers with modified independence and good safety awareness to reduce risk for falls Baseline: Min A, poor safety Goal status: IN PROGRESS  4.  Demo low risk for falls per time 12 seconds TUG test Baseline: 25+ w/ Min A required Goal status: IN PROGRESS    ASSESSMENT:  CLINICAL IMPRESSION: BP initially quite high and activities performed in a supine position to facilitate reduced exertion and for continued monitoring of BP with decrease after 20 min to 811 systolic. Required frequent  cues and encouragement throughout session for improve control  during exercise interventions.  Gait training reveals quite a degree of instability with instances of knees buckling and demo increased exertion with activity.  Overall, pt demonstrates pretty limited activity tolerance and continued back pain and report of general discomfort. Recommend to patient and spouse use of RW vs cane at this time due to history of falling and continued LE buckling.   OBJECTIVE IMPAIRMENTS: Abnormal gait, decreased activity tolerance, decreased balance, decreased coordination, decreased endurance, decreased knowledge of use of DME, decreased mobility, difficulty walking, decreased strength, decreased safety awareness, improper body mechanics, and pain.   ACTIVITY LIMITATIONS: carrying, lifting, bending, standing, squatting, sleeping, stairs, transfers, toileting, dressing, reach over head, and locomotion level  PARTICIPATION LIMITATIONS: meal prep, cleaning, laundry, shopping, community activity, occupation, and yard work  PERSONAL FACTORS: Age, Time since onset of injury/illness/exacerbation, and 3+ comorbidities: multiple systems with numerous medical and physical co-morbidities  are also affecting patient's functional outcome.   REHAB POTENTIAL: Fair due to interaction of conditions  CLINICAL DECISION MAKING: Evolving/moderate complexity  EVALUATION COMPLEXITY: Moderate  PLAN:  PT FREQUENCY: 1-2x/week  PT DURATION: 8 weeks  PLANNED INTERVENTIONS: Therapeutic exercises, Therapeutic activity, Neuromuscular re-education, Balance training, Gait training, Patient/Family education, Self Care, Joint mobilization, Stair training, Vestibular training, Canalith repositioning, Orthotic/Fit training, DME instructions, Aquatic Therapy, Dry Needling, Electrical stimulation, Spinal mobilization, Cryotherapy, Moist heat, Splintting, Taping, Traction, Ionotophoresis '4mg'$ /ml Dexamethasone, and Manual therapy  PLAN FOR NEXT SESSION: monitor vitals; transfer from elevated  seat   11:05 AM, 01/18/22 M. Sherlyn Lees, PT, DPT Physical Therapist- [place] Office Number: [phone]   [place] [facility] [address] [place], [place] [address] Phone # [phone] Fax # [phone]

## 2022-01-18 NOTE — Therapy (Incomplete)
OUTPATIENT PHYSICAL THERAPY NEURO TREATMENT   Patient Name: Kirk [name] MRN: [id number] DOB:Jun 06, 1960, 61 y.o., male Today's Date: 01/18/2022   PCP: Sandrea Hughs, NP REFERRING PROVIDER: Consuella Lose, MD  END OF SESSION:      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)    Anxiety    Asthma    Asthma    moderate persistent   Carpal tunnel syndrome    Cellulitis and abscess of trunk    Cholelithiasis 07/13/2014   Chronic headaches    Debility, unspecified    Dermatophytosis of the body    Dysrhythmia    history of   Edema    End stage renal disease on dialysis ([place])    "MWF; [facility] in [place]" (10/21/2014)   Essential hypertension, benign    GERD (gastroesophageal reflux disease)    Gout, unspecified    HTN (hypertension)    Hypertrophy of prostate without urinary obstruction and other lower urinary tract symptoms (LUTS)    Hypotension, unspecified    Impotence of organic origin    Insomnia, unspecified    Kidney replaced by transplant    Localization-related (focal) (partial) epilepsy and epileptic syndromes with complex partial seizures, without mention of intractable epilepsy    12-15-19- Wife states he has NEVER had a seizure    Lumbago    Memory loss    OSA on CPAP    Other and unspecified hyperlipidemia    controlled /managed per wife    Other chronic nonalcoholic liver disease    Other malaise and fatigue    Other nonspecific abnormal serum enzyme levels    Pain in joint, lower leg    Pain in joint, upper arm    Pneumonia "several times"   PONV (postoperative nausea and vomiting)    Renal dialysis status(V45.11) 02/05/2010   restarted 01/02/13 ofter renal trransplant failure   Secondary hyperparathyroidism (of renal origin)    Shortness of breath    Sleep apnea    wears cpap     Tension headache    Unspecified constipation    Unspecified essential hypertension    Unspecified hereditary and idiopathic peripheral neuropathy    Unspecified vitamin D deficiency    Past Surgical History:  Procedure Laterality Date   AV FISTULA PLACEMENT Left ?2010   "forearm; at [place]"   [place]  03/21/2011   CHOLECYSTECTOMY N/A 10/21/2014   Procedure: LAPAROSCOPIC CHOLECYSTECTOMY WITH INTRAOPERATIVE CHOLANGIOGRAM;  Surgeon: Autumn Messing III, MD;  Location: [facility];  Service: General;  Laterality: N/A;   COLONOSCOPY     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   IR ANGIO INTRA EXTRACRAN SEL INTERNAL CAROTID UNI R MOD SED  11/30/2021   IR ANGIOGRAM FOLLOW UP STUDY  11/30/2021   IR NEURO EACH ADD'L AFTER BASIC UNI RIGHT (MS)  11/30/2021   IR TRANSCATH/EMBOLIZ  11/30/2021   KIDNEY TRANSPLANT  08/17/2011   [facility]    LAPAROSCOPIC CHOLECYSTECTOMY  10/21/2014   w/IOC   LEFT HEART CATHETERIZATION WITH CORONARY ANGIOGRAM N/A 03/21/2011   Procedure: LEFT HEART CATHETERIZATION WITH CORONARY ANGIOGRAM;  Surgeon: [name], MD;  Location: [facility] CATH LAB;  Service: Cardiovascular;  Laterality: N/A;   NEPHRECTOMY  08/2013   removed transplaned kidney   POLYPECTOMY  POSTERIOR FUSION CERVICAL SPINE  06/25/2012   for spinal stenosis   RADIOLOGY WITH ANESTHESIA N/A 11/30/2021   Procedure: RIGHT MIDDLE MENINGEAL ARTERY EMBOLIZATION;  Surgeon: Radiologist, Medication, MD;  Location: [place];  Service: Radiology;  Laterality: N/A;   VASECTOMY  2010   Patient Active Problem List   Diagnosis Date Noted   Cerebral aneurysm 11/30/2021   Subdural hematoma ([place]) 11/30/2021   Traumatic subdural hematoma (SDH) ([facility]) 11/21/2021   SDH (subdural hematoma) ([place]) 11/08/2021   Seizure ([place]) 11/08/2021   Chronic left shoulder pain 09/21/2021   Chronic right shoulder pain 09/21/2021   Cough with hemoptysis 08/01/2021   Preop respiratory exam  03/02/2021   Intractable nausea and vomiting 01/09/2021   Abnormal CT of the chest 05/24/2020   Rotator cuff syndrome of right shoulder 00/34/9179   Chronic systolic heart failure ([place]) 09/25/2018   Fluid overload 09/17/2018   Hypertensive urgency 09/17/2018   Right knee pain 06/02/2018   Right tibial fracture 06/02/2018   Cigarette smoker 07/11/2017   Anxiety 05/30/2017   Hypokalemia    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   Shunt malfunction 07/03/2016   Need for hepatitis C screening test 06/29/2015   Myalgia 05/17/2015   Secondary central sleep apnea 12/09/2014   Obesity hypoventilation syndrome ([place]) 12/09/2014   Narcotic drug use 12/09/2014   Hypersomnia with sleep apnea 12/09/2014   SCC (squamous cell carcinoma), arm 11/09/2014   Mild persistent chronic asthma without complication 15/05/6977   ESRD on dialysis ([place]) 09/09/2014   Essential hypertension 09/09/2014   HLD (hyperlipidemia) 09/09/2014   Pancytopenia ([place]) 04/28/2014   Thrombocytopenia ([place]) 04/10/2014   Fungal dermatitis 03/17/2014   S/p nephrectomy 09/17/2013   Anemia 09/03/2012   Uncontrolled hypertension    Tension headache    Memory loss    Edema    Thoracic or lumbosacral neuritis or radiculitis 03/27/2012   Nerve root pain 03/27/2012   History of kidney transplant 09/10/2011   Type 2 diabetes mellitus with diabetic nephropathy, without long-term current use of insulin ([place]) 08/30/2011   Hearing loss 08/16/2011   Obesity 08/16/2011   OSA on CPAP 08/05/2011   GIB (gastrointestinal bleeding) 10/28/2007    ONSET DATE: October 2023  REFERRING DIAG: S06.5X1A (ICD-10-CM) - Traumatic subdural hematoma with loss of consciousness of 30 minutes or less, initial encounter ([place]) I67.1 (ICD-10-CM) - Cerebral aneurysm  THERAPY DIAG:  No diagnosis found.  Rationale for Evaluation and Treatment: Rehabilitation  SUBJECTIVE:  SUBJECTIVE STATEMENT: Difficulty with blood pressure regulation  Pt accompanied by: significant other  PERTINENT HISTORY: 61 y.o. male whose history initially begins approximately 1 month ago when he presented to the emergency department with seizure.  His imaging revealed a small acute right convexity subdural hematoma.  At the time he did not require operative intervention.  Patient has subsequently presented with headache and further seizures which have been managed medically.  Repeat CT scan has demonstrated enlargement of the now subacute/chronic right frontal convexity subdural hematoma, with slightly worsened local mass effect and midline shift.    PAIN:  Are you having pain? Yes: NPRS scale: past 10/10 Pain location: lumbar spine Pain description: sore/ache/stab Aggravating factors: sitting too long Relieving factors: nothing  PRECAUTIONS: Fall  WEIGHT BEARING RESTRICTIONS: No  FALLS: Has patient fallen in last 6 months? Yes. Number of falls 1 big one and multiple falls backwards to chairs  LIVING ENVIRONMENT: Lives with: lives with their family and lives with their spouse Lives in: House/apartment Stairs: Yes: Internal: 7 to landing + 4 steps; on right going up and bilateral but cannot reach both Has following equipment at home: Single point cane  PLOF: Independent with household mobility with device  PATIENT GOALS: "not be dependent on cane"  OBJECTIVE:      TODAY'S TREATMENT: 01/23/22 Activity Comments                         HOME EXERCISE PROGRAM Last updated: 01/11/22 Access Code: [email or web address] URL: [email or web address] Date: 01/11/2022 Prepared by: [facility]  Exercises - Supine March  - 1 x daily - 5 x weekly - 2 sets - 10 reps - Bent Knee Fallouts  - 1 x daily - 5 x weekly - 2 sets - 10 reps - Supine  Lower Trunk Rotation  - 1 x daily - 5 x weekly - 2 sets - 10 reps - Sit to Stand with Counter Support  - 1 x daily - 5 x weekly - 2 sets - 5 reps     Below measures were taken at time of initial evaluation unless otherwise specified:   DIAGNOSTIC FINDINGS: imaging  COGNITION: Overall cognitive status: Impaired and very poor safety awareness and insight to his deficits   SENSATION: WFL  COORDINATION: RUE and RLE limitations > LUE/LLE  EDEMA:  none  MUSCLE TONE: WNL  MUSCLE LENGTH:   DTRs:  NT  POSTURE: WFL  LOWER EXTREMITY ROM:     WNL  LOWER EXTREMITY MMT:    RLE 3/5 gross stretngth LLE 4/5 gross strength  BED MOBILITY:  NT  TRANSFERS: Assistive device utilized: Single point cane  Sit to stand: Min A Stand to sit: Min A Chair to chair: Min A Floor:  NT  RAMP:  Level of Assistance: Min A Assistive device utilized: Single point cane Ramp Comments:   CURB:  Level of Assistance: Min A Assistive device utilized: Single point cane Curb Comments:   STAIRS: Level of Assistance: Min A Industrial/product designer Technique: Step to Pattern with Single Rail on Left Number of Stairs: 4  Height of Stairs: 4-6  Comments:   GAIT: Gait pattern:  unsteady throughout right LOB and wide BOS Distance walked: 75 Assistive device utilized: Single point cane Level of assistance: Min A Comments: very unsteady, recommend use of RW vs cane--pt resistant to this suggestion  FUNCTIONAL TESTS:  5 times sit to stand: 51.35 sec w/ min A Timed  up and go (TUG): 25.15 sec Berg Balance Scale: NT  PATIENT SURVEYS:       PATIENT EDUCATION: Education details: recommend use of RW vs cane due to unsteadiness and high risk for falls Person educated: Patient and Spouse Education method: Explanation Education comprehension:  pt is resistant to this recommendation  HOME EXERCISE PROGRAM: TBD  GOALS: Goals reviewed with patient? Yes  SHORT TERM GOALS: Target date:  01/11/2022    Patient will be independent in HEP to improve functional outcomes Baseline: Goal status: IN PROGRESS  2.  Demo functional transfers with supervision and good safety awareness requiring only occasional cues in sequence and safety Baseline: min A Goal status: IN PROGRESS  3.  Demo improved safety in functional mobility demonstrating supervision performance x 150 ft w/out LOB in order to reduce risk for falls Baseline: Min A w/ cane Goal status: IN PROGRESS  4.  Demo improved safety with functional mobility as evidenced by time 15 sec TUG test using least restrictive AD Baseline: 25 sec w/ min A and cane Goal status: IN PROGRESS    LONG TERM GOALS: Target date: 02/15/2022    Ambulate x 300 ft w/ least restrictive AD and modified independence on level surfaces, curb negotiation, ramp, and stair ambulation to improve safety in functional mobility and reduce need for caregiver assistance Baseline: Min A Goal status: IN PROGRESS  2.  Manifest improved BLE strength and dynamic balance as evidenced by time 15 sec 5xSTS Baseline: 51 sec requiring min A to complete Goal status: IN PROGRESS  3.  Perform functional transfers with modified independence and good safety awareness to reduce risk for falls Baseline: Min A, poor safety Goal status: IN PROGRESS  4.  Demo low risk for falls per time 12 seconds TUG test Baseline: 25+ w/ Min A required Goal status: IN PROGRESS    ASSESSMENT:  CLINICAL IMPRESSION: BP initially quite high and activities performed in a supine position to facilitate reduced exertion and for continued monitoring of BP with decrease after 20 min to 299 systolic. Required frequent cues and encouragement throughout session for improve control during exercise interventions.  Gait training reveals quite a degree of instability with instances of knees buckling and demo increased exertion with activity.  Overall, pt demonstrates pretty limited activity  tolerance and continued back pain and report of general discomfort. Recommend to patient and spouse use of RW vs cane at this time due to history of falling and continued LE buckling.   OBJECTIVE IMPAIRMENTS: Abnormal gait, decreased activity tolerance, decreased balance, decreased coordination, decreased endurance, decreased knowledge of use of DME, decreased mobility, difficulty walking, decreased strength, decreased safety awareness, improper body mechanics, and pain.   ACTIVITY LIMITATIONS: carrying, lifting, bending, standing, squatting, sleeping, stairs, transfers, toileting, dressing, reach over head, and locomotion level  PARTICIPATION LIMITATIONS: meal prep, cleaning, laundry, shopping, community activity, occupation, and yard work  PERSONAL FACTORS: Age, Time since onset of injury/illness/exacerbation, and 3+ comorbidities: multiple systems with numerous medical and physical co-morbidities  are also affecting patient's functional outcome.   REHAB POTENTIAL: Fair due to interaction of conditions  CLINICAL DECISION MAKING: Evolving/moderate complexity  EVALUATION COMPLEXITY: Moderate  PLAN:  PT FREQUENCY: 1-2x/week  PT DURATION: 8 weeks  PLANNED INTERVENTIONS: Therapeutic exercises, Therapeutic activity, Neuromuscular re-education, Balance training, Gait training, Patient/Family education, Self Care, Joint mobilization, Stair training, Vestibular training, Canalith repositioning, Orthotic/Fit training, DME instructions, Aquatic Therapy, Dry Needling, Electrical stimulation, Spinal mobilization, Cryotherapy, Moist heat, Splintting, Taping, Traction, Ionotophoresis '4mg'$ /ml Dexamethasone, and Manual  therapy  PLAN FOR NEXT SESSION: monitor vitals; transfer from elevated seat

## 2022-01-18 NOTE — Telephone Encounter (Signed)
Patient wife, [name], called and stated that patient has ran out of the Oxycodone that the Hospital Prescribed him 11/29/2021.  Wanting to know if you would refill the medication.  Epic LR: 11/29/2021-Hospital.   Pended Rx and sent to [facility] for approval.

## 2022-01-19 DIAGNOSIS — D509 Iron deficiency anemia, unspecified: Secondary | ICD-10-CM | POA: Diagnosis not present

## 2022-01-19 DIAGNOSIS — N186 End stage renal disease: Secondary | ICD-10-CM | POA: Diagnosis not present

## 2022-01-19 DIAGNOSIS — N2581 Secondary hyperparathyroidism of renal origin: Secondary | ICD-10-CM | POA: Diagnosis not present

## 2022-01-19 DIAGNOSIS — Z992 Dependence on renal dialysis: Secondary | ICD-10-CM | POA: Diagnosis not present

## 2022-01-19 DIAGNOSIS — D631 Anemia in chronic kidney disease: Secondary | ICD-10-CM | POA: Diagnosis not present

## 2022-01-21 DIAGNOSIS — N186 End stage renal disease: Secondary | ICD-10-CM | POA: Diagnosis not present

## 2022-01-21 DIAGNOSIS — D509 Iron deficiency anemia, unspecified: Secondary | ICD-10-CM | POA: Diagnosis not present

## 2022-01-21 DIAGNOSIS — N2581 Secondary hyperparathyroidism of renal origin: Secondary | ICD-10-CM | POA: Diagnosis not present

## 2022-01-21 DIAGNOSIS — Z992 Dependence on renal dialysis: Secondary | ICD-10-CM | POA: Diagnosis not present

## 2022-01-21 DIAGNOSIS — D631 Anemia in chronic kidney disease: Secondary | ICD-10-CM | POA: Diagnosis not present

## 2022-01-22 DIAGNOSIS — Z992 Dependence on renal dialysis: Secondary | ICD-10-CM | POA: Diagnosis not present

## 2022-01-22 DIAGNOSIS — T8612 Kidney transplant failure: Secondary | ICD-10-CM | POA: Diagnosis not present

## 2022-01-22 DIAGNOSIS — N186 End stage renal disease: Secondary | ICD-10-CM | POA: Diagnosis not present

## 2022-01-23 ENCOUNTER — Ambulatory Visit: Payer: Medicare Other | Admitting: Occupational Therapy

## 2022-01-23 ENCOUNTER — Ambulatory Visit: Payer: Medicare Other | Admitting: Physical Therapy

## 2022-01-23 ENCOUNTER — Ambulatory Visit: Payer: Medicare Other

## 2022-01-23 ENCOUNTER — Other Ambulatory Visit: Payer: Self-pay | Admitting: Family

## 2022-01-24 DIAGNOSIS — N2581 Secondary hyperparathyroidism of renal origin: Secondary | ICD-10-CM | POA: Diagnosis not present

## 2022-01-24 DIAGNOSIS — N186 End stage renal disease: Secondary | ICD-10-CM | POA: Diagnosis not present

## 2022-01-24 DIAGNOSIS — Z992 Dependence on renal dialysis: Secondary | ICD-10-CM | POA: Diagnosis not present

## 2022-01-24 DIAGNOSIS — D509 Iron deficiency anemia, unspecified: Secondary | ICD-10-CM | POA: Diagnosis not present

## 2022-01-24 DIAGNOSIS — D631 Anemia in chronic kidney disease: Secondary | ICD-10-CM | POA: Diagnosis not present

## 2022-01-24 NOTE — Therapy (Incomplete)
OUTPATIENT PHYSICAL THERAPY NEURO TREATMENT   Patient Name: Kirk [name] MRN: [id number] DOB:12-01-1960, 62 y.o., male Today's Date: 01/24/2022   PCP: Sandrea Hughs, NP REFERRING PROVIDER: Consuella Lose, MD  END OF SESSION:      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)    Anxiety    Asthma    Asthma    moderate persistent   Carpal tunnel syndrome    Cellulitis and abscess of trunk    Cholelithiasis 07/13/2014   Chronic headaches    Debility, unspecified    Dermatophytosis of the body    Dysrhythmia    history of   Edema    End stage renal disease on dialysis ([place])    "MWF; [facility] in [place]" (10/21/2014)   Essential hypertension, benign    GERD (gastroesophageal reflux disease)    Gout, unspecified    HTN (hypertension)    Hypertrophy of prostate without urinary obstruction and other lower urinary tract symptoms (LUTS)    Hypotension, unspecified    Impotence of organic origin    Insomnia, unspecified    Kidney replaced by transplant    Localization-related (focal) (partial) epilepsy and epileptic syndromes with complex partial seizures, without mention of intractable epilepsy    12-15-19- Wife states he has NEVER had a seizure    Lumbago    Memory loss    OSA on CPAP    Other and unspecified hyperlipidemia    controlled /managed per wife    Other chronic nonalcoholic liver disease    Other malaise and fatigue    Other nonspecific abnormal serum enzyme levels    Pain in joint, lower leg    Pain in joint, upper arm    Pneumonia "several times"   PONV (postoperative nausea and vomiting)    Renal dialysis status(V45.11) 02/05/2010   restarted 01/02/13 ofter renal trransplant failure   Secondary hyperparathyroidism (of renal origin)    Shortness of breath    Sleep apnea    wears cpap     Tension headache    Unspecified constipation    Unspecified essential hypertension    Unspecified hereditary and idiopathic peripheral neuropathy    Unspecified vitamin D deficiency    Past Surgical History:  Procedure Laterality Date   AV FISTULA PLACEMENT Left ?2010   "forearm; at [place]"   [place]  03/21/2011   CHOLECYSTECTOMY N/A 10/21/2014   Procedure: LAPAROSCOPIC CHOLECYSTECTOMY WITH INTRAOPERATIVE CHOLANGIOGRAM;  Surgeon: Autumn Messing III, MD;  Location: [facility];  Service: General;  Laterality: N/A;   COLONOSCOPY     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   IR ANGIO INTRA EXTRACRAN SEL INTERNAL CAROTID UNI R MOD SED  11/30/2021   IR ANGIOGRAM FOLLOW UP STUDY  11/30/2021   IR NEURO EACH ADD'L AFTER BASIC UNI RIGHT (MS)  11/30/2021   IR TRANSCATH/EMBOLIZ  11/30/2021   KIDNEY TRANSPLANT  08/17/2011   [facility]    LAPAROSCOPIC CHOLECYSTECTOMY  10/21/2014   w/IOC   LEFT HEART CATHETERIZATION WITH CORONARY ANGIOGRAM N/A 03/21/2011   Procedure: LEFT HEART CATHETERIZATION WITH CORONARY ANGIOGRAM;  Surgeon: [name], MD;  Location: [facility] CATH LAB;  Service: Cardiovascular;  Laterality: N/A;   NEPHRECTOMY  08/2013   removed transplaned kidney   POLYPECTOMY  POSTERIOR FUSION CERVICAL SPINE  06/25/2012   for spinal stenosis   RADIOLOGY WITH ANESTHESIA N/A 11/30/2021   Procedure: RIGHT MIDDLE MENINGEAL ARTERY EMBOLIZATION;  Surgeon: Radiologist, Medication, MD;  Location: [place];  Service: Radiology;  Laterality: N/A;   VASECTOMY  2010   Patient Active Problem List   Diagnosis Date Noted   Cerebral aneurysm 11/30/2021   Subdural hematoma ([place]) 11/30/2021   Traumatic subdural hematoma (SDH) (HCC) 11/21/2021   SDH (subdural hematoma) ([place]) 11/08/2021   Seizure ([place]) 11/08/2021   Chronic left shoulder pain 09/21/2021   Chronic right shoulder pain 09/21/2021   Cough with hemoptysis 08/01/2021   Preop respiratory exam  03/02/2021   Intractable nausea and vomiting 01/09/2021   Abnormal CT of the chest 05/24/2020   Rotator cuff syndrome of right shoulder 43/56/8616   Chronic systolic heart failure ([place]) 09/25/2018   Fluid overload 09/17/2018   Hypertensive urgency 09/17/2018   Right knee pain 06/02/2018   Right tibial fracture 06/02/2018   Cigarette smoker 07/11/2017   Anxiety 05/30/2017   Hypokalemia    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   Shunt malfunction 07/03/2016   Need for hepatitis C screening test 06/29/2015   Myalgia 05/17/2015   Secondary central sleep apnea 12/09/2014   Obesity hypoventilation syndrome ([place]) 12/09/2014   Narcotic drug use 12/09/2014   Hypersomnia with sleep apnea 12/09/2014   SCC (squamous cell carcinoma), arm 11/09/2014   Mild persistent chronic asthma without complication 83/72/9021   ESRD on dialysis ([place]) 09/09/2014   Essential hypertension 09/09/2014   HLD (hyperlipidemia) 09/09/2014   Pancytopenia ([address]) 04/28/2014   Thrombocytopenia ([place]) 04/10/2014   Fungal dermatitis 03/17/2014   S/p nephrectomy 09/17/2013   Anemia 09/03/2012   Uncontrolled hypertension    Tension headache    Memory loss    Edema    Thoracic or lumbosacral neuritis or radiculitis 03/27/2012   Nerve root pain 03/27/2012   History of kidney transplant 09/10/2011   Type 2 diabetes mellitus with diabetic nephropathy, without long-term current use of insulin ([place]) 08/30/2011   Hearing loss 08/16/2011   Obesity 08/16/2011   OSA on CPAP 08/05/2011   GIB (gastrointestinal bleeding) 10/28/2007    ONSET DATE: October 2023  REFERRING DIAG: S06.5X1A (ICD-10-CM) - Traumatic subdural hematoma with loss of consciousness of 30 minutes or less, initial encounter ([place]) I67.1 (ICD-10-CM) - Cerebral aneurysm  THERAPY DIAG:  No diagnosis found.  Rationale for Evaluation and Treatment: Rehabilitation  SUBJECTIVE:  SUBJECTIVE STATEMENT: Difficulty with blood pressure regulation  Pt accompanied by: significant other  PERTINENT HISTORY: 62 y.o. male whose history initially begins approximately 1 month ago when he presented to the emergency department with seizure.  His imaging revealed a small acute right convexity subdural hematoma.  At the time he did not require operative intervention.  Patient has subsequently presented with headache and further seizures which have been managed medically.  Repeat CT scan has demonstrated enlargement of the now subacute/chronic right frontal convexity subdural hematoma, with slightly worsened local mass effect and midline shift.    PAIN:  Are you having pain? Yes: NPRS scale: past 10/10 Pain location: lumbar spine Pain description: sore/ache/stab Aggravating factors: sitting too long Relieving factors: nothing  PRECAUTIONS: Fall  WEIGHT BEARING RESTRICTIONS: No  FALLS: Has patient fallen in last 6 months? Yes. Number of falls 1 big one and multiple falls backwards to chairs  LIVING ENVIRONMENT: Lives with: lives with their family and lives with their spouse Lives in: House/apartment Stairs: Yes: Internal: 7 to landing + 4 steps; on right going up and bilateral but cannot reach both Has following equipment at home: Single point cane  PLOF: Independent with household mobility with device  PATIENT GOALS: "not be dependent on cane"  OBJECTIVE:      TODAY'S TREATMENT: 01/25/22 Activity Comments                         HOME EXERCISE PROGRAM Last updated: 01/11/22 Access Code: [email or web address] URL: [email or web address] Date: 01/11/2022 Prepared by: [facility]  Exercises - Supine March  - 1 x daily - 5 x weekly - 2 sets - 10 reps - Bent Knee Fallouts  - 1 x daily - 5 x weekly - 2 sets - 10 reps - Supine  Lower Trunk Rotation  - 1 x daily - 5 x weekly - 2 sets - 10 reps - Sit to Stand with Counter Support  - 1 x daily - 5 x weekly - 2 sets - 5 reps     Below measures were taken at time of initial evaluation unless otherwise specified:   DIAGNOSTIC FINDINGS: imaging  COGNITION: Overall cognitive status: Impaired and very poor safety awareness and insight to his deficits   SENSATION: WFL  COORDINATION: RUE and RLE limitations > LUE/LLE  EDEMA:  none  MUSCLE TONE: WNL  MUSCLE LENGTH:   DTRs:  NT  POSTURE: WFL  LOWER EXTREMITY ROM:     WNL  LOWER EXTREMITY MMT:    RLE 3/5 gross stretngth LLE 4/5 gross strength  BED MOBILITY:  NT  TRANSFERS: Assistive device utilized: Single point cane  Sit to stand: Min A Stand to sit: Min A Chair to chair: Min A Floor:  NT  RAMP:  Level of Assistance: Min A Assistive device utilized: Single point cane Ramp Comments:   CURB:  Level of Assistance: Min A Assistive device utilized: Single point cane Curb Comments:   STAIRS: Level of Assistance: Min A Industrial/product designer Technique: Step to Pattern with Single Rail on Left Number of Stairs: 4  Height of Stairs: 4-6  Comments:   GAIT: Gait pattern:  unsteady throughout right LOB and wide BOS Distance walked: 75 Assistive device utilized: Single point cane Level of assistance: Min A Comments: very unsteady, recommend use of RW vs cane--pt resistant to this suggestion  FUNCTIONAL TESTS:  5 times sit to stand: 51.35 sec w/ min A Timed  up and go (TUG): 25.15 sec Berg Balance Scale: NT  PATIENT SURVEYS:       PATIENT EDUCATION: Education details: recommend use of RW vs cane due to unsteadiness and high risk for falls Person educated: Patient and Spouse Education method: Explanation Education comprehension:  pt is resistant to this recommendation  HOME EXERCISE PROGRAM: TBD  GOALS: Goals reviewed with patient? Yes  SHORT TERM GOALS: Target date:  01/11/2022    Patient will be independent in HEP to improve functional outcomes Baseline: Goal status: IN PROGRESS  2.  Demo functional transfers with supervision and good safety awareness requiring only occasional cues in sequence and safety Baseline: min A Goal status: IN PROGRESS  3.  Demo improved safety in functional mobility demonstrating supervision performance x 150 ft w/out LOB in order to reduce risk for falls Baseline: Min A w/ cane Goal status: IN PROGRESS  4.  Demo improved safety with functional mobility as evidenced by time 15 sec TUG test using least restrictive AD Baseline: 25 sec w/ min A and cane Goal status: IN PROGRESS    LONG TERM GOALS: Target date: 02/15/2022    Ambulate x 300 ft w/ least restrictive AD and modified independence on level surfaces, curb negotiation, ramp, and stair ambulation to improve safety in functional mobility and reduce need for caregiver assistance Baseline: Min A Goal status: IN PROGRESS  2.  Manifest improved BLE strength and dynamic balance as evidenced by time 15 sec 5xSTS Baseline: 51 sec requiring min A to complete Goal status: IN PROGRESS  3.  Perform functional transfers with modified independence and good safety awareness to reduce risk for falls Baseline: Min A, poor safety Goal status: IN PROGRESS  4.  Demo low risk for falls per time 12 seconds TUG test Baseline: 25+ w/ Min A required Goal status: IN PROGRESS    ASSESSMENT:  CLINICAL IMPRESSION: BP initially quite high and activities performed in a supine position to facilitate reduced exertion and for continued monitoring of BP with decrease after 20 min to 341 systolic. Required frequent cues and encouragement throughout session for improve control during exercise interventions.  Gait training reveals quite a degree of instability with instances of knees buckling and demo increased exertion with activity.  Overall, pt demonstrates pretty limited activity  tolerance and continued back pain and report of general discomfort. Recommend to patient and spouse use of RW vs cane at this time due to history of falling and continued LE buckling.   OBJECTIVE IMPAIRMENTS: Abnormal gait, decreased activity tolerance, decreased balance, decreased coordination, decreased endurance, decreased knowledge of use of DME, decreased mobility, difficulty walking, decreased strength, decreased safety awareness, improper body mechanics, and pain.   ACTIVITY LIMITATIONS: carrying, lifting, bending, standing, squatting, sleeping, stairs, transfers, toileting, dressing, reach over head, and locomotion level  PARTICIPATION LIMITATIONS: meal prep, cleaning, laundry, shopping, community activity, occupation, and yard work  PERSONAL FACTORS: Age, Time since onset of injury/illness/exacerbation, and 3+ comorbidities: multiple systems with numerous medical and physical co-morbidities  are also affecting patient's functional outcome.   REHAB POTENTIAL: Fair due to interaction of conditions  CLINICAL DECISION MAKING: Evolving/moderate complexity  EVALUATION COMPLEXITY: Moderate  PLAN:  PT FREQUENCY: 1-2x/week  PT DURATION: 8 weeks  PLANNED INTERVENTIONS: Therapeutic exercises, Therapeutic activity, Neuromuscular re-education, Balance training, Gait training, Patient/Family education, Self Care, Joint mobilization, Stair training, Vestibular training, Canalith repositioning, Orthotic/Fit training, DME instructions, Aquatic Therapy, Dry Needling, Electrical stimulation, Spinal mobilization, Cryotherapy, Moist heat, Splintting, Taping, Traction, Ionotophoresis '4mg'$ /ml Dexamethasone, and Manual  therapy  PLAN FOR NEXT SESSION: monitor vitals; transfer from elevated seat

## 2022-01-25 ENCOUNTER — Ambulatory Visit: Payer: Medicare Other | Admitting: Physical Therapy

## 2022-01-25 ENCOUNTER — Ambulatory Visit: Payer: Medicare Other | Admitting: Occupational Therapy

## 2022-01-26 DIAGNOSIS — D631 Anemia in chronic kidney disease: Secondary | ICD-10-CM | POA: Diagnosis not present

## 2022-01-26 DIAGNOSIS — N2581 Secondary hyperparathyroidism of renal origin: Secondary | ICD-10-CM | POA: Diagnosis not present

## 2022-01-26 DIAGNOSIS — N186 End stage renal disease: Secondary | ICD-10-CM | POA: Diagnosis not present

## 2022-01-26 DIAGNOSIS — D509 Iron deficiency anemia, unspecified: Secondary | ICD-10-CM | POA: Diagnosis not present

## 2022-01-26 DIAGNOSIS — Z992 Dependence on renal dialysis: Secondary | ICD-10-CM | POA: Diagnosis not present

## 2022-01-29 DIAGNOSIS — D631 Anemia in chronic kidney disease: Secondary | ICD-10-CM | POA: Diagnosis not present

## 2022-01-29 DIAGNOSIS — D509 Iron deficiency anemia, unspecified: Secondary | ICD-10-CM | POA: Diagnosis not present

## 2022-01-29 DIAGNOSIS — N2581 Secondary hyperparathyroidism of renal origin: Secondary | ICD-10-CM | POA: Diagnosis not present

## 2022-01-29 DIAGNOSIS — N186 End stage renal disease: Secondary | ICD-10-CM | POA: Diagnosis not present

## 2022-01-29 DIAGNOSIS — Z992 Dependence on renal dialysis: Secondary | ICD-10-CM | POA: Diagnosis not present

## 2022-01-29 NOTE — Therapy (Incomplete)
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)    Anxiety    Asthma    Asthma    moderate persistent   Carpal tunnel syndrome    Cellulitis and abscess of trunk    Cholelithiasis 07/13/2014   Chronic headaches    Debility, unspecified    Dermatophytosis of the body    Dysrhythmia    history of   Edema    End stage renal disease on dialysis ([place])    "MWF; [facility] in [place]" (10/21/2014)   Essential hypertension, benign    GERD (gastroesophageal reflux disease)    Gout, unspecified    HTN (hypertension)    Hypertrophy of prostate without urinary obstruction and other lower urinary tract symptoms (LUTS)    Hypotension, unspecified    Impotence of organic origin    Insomnia, unspecified    Kidney replaced by transplant    Localization-related (focal) (partial) epilepsy and epileptic syndromes with complex partial seizures, without mention of intractable epilepsy    12-15-19- Wife states he has NEVER had a seizure    Lumbago    Memory loss    OSA on CPAP    Other and unspecified hyperlipidemia    controlled /managed per wife    Other chronic nonalcoholic liver disease    Other malaise and fatigue    Other nonspecific abnormal serum enzyme levels    Pain in joint, lower leg    Pain in joint, upper arm    Pneumonia "several times"   PONV (postoperative nausea and vomiting)    Renal dialysis status(V45.11) 02/05/2010   restarted 01/02/13 ofter renal trransplant failure   Secondary hyperparathyroidism (of renal origin)    Shortness of breath    Sleep apnea    wears cpap     Tension headache    Unspecified constipation    Unspecified essential hypertension    Unspecified hereditary and idiopathic peripheral neuropathy    Unspecified vitamin D deficiency    Past Surgical History:  Procedure Laterality Date   AV FISTULA PLACEMENT Left ?2010   "forearm; at [facility]"   [facility]  03/21/2011   CHOLECYSTECTOMY N/A 10/21/2014   Procedure: LAPAROSCOPIC CHOLECYSTECTOMY WITH INTRAOPERATIVE CHOLANGIOGRAM;  Surgeon: Autumn Messing III, MD;  Location: [facility];  Service: General;  Laterality: N/A;   COLONOSCOPY     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   IR ANGIO INTRA EXTRACRAN SEL INTERNAL CAROTID UNI R MOD SED  11/30/2021   IR ANGIOGRAM FOLLOW UP STUDY  11/30/2021   IR NEURO EACH ADD'L AFTER BASIC UNI RIGHT (MS)  11/30/2021   IR TRANSCATH/EMBOLIZ  11/30/2021   KIDNEY TRANSPLANT  08/17/2011   [facility]    LAPAROSCOPIC CHOLECYSTECTOMY  10/21/2014   w/IOC   LEFT HEART CATHETERIZATION WITH CORONARY ANGIOGRAM N/A 03/21/2011   Procedure: LEFT HEART CATHETERIZATION WITH CORONARY ANGIOGRAM;  Surgeon: [name], MD;  Location: [facility] CATH LAB;  Service: Cardiovascular;  Laterality: N/A;   NEPHRECTOMY  08/2013   removed transplaned kidney   POLYPECTOMY  POSTERIOR FUSION CERVICAL SPINE  06/25/2012   for spinal stenosis   RADIOLOGY WITH ANESTHESIA N/A 11/30/2021   Procedure: RIGHT MIDDLE MENINGEAL ARTERY EMBOLIZATION;  Surgeon: Radiologist, Medication, MD;  Location: [facility];  Service: Radiology;  Laterality: N/A;   VASECTOMY  2010   Patient Active Problem List   Diagnosis Date Noted   Cerebral aneurysm 11/30/2021   Subdural hematoma ([facility]) 11/30/2021   Traumatic subdural hematoma (SDH) ([facility]) 11/21/2021   SDH (subdural hematoma) ([place]) 11/08/2021   Seizure ([facility]) 11/08/2021   Chronic left shoulder pain 09/21/2021   Chronic right shoulder pain 09/21/2021   Cough with hemoptysis 08/01/2021   Preop respiratory exam  03/02/2021   Intractable nausea and vomiting 01/09/2021   Abnormal CT of the chest 05/24/2020   Rotator cuff syndrome of right shoulder 16/94/5038   Chronic systolic heart failure ([place]) 09/25/2018   Fluid overload 09/17/2018   Hypertensive urgency 09/17/2018   Right knee pain 06/02/2018   Right tibial fracture 06/02/2018   Cigarette smoker 07/11/2017   Anxiety 05/30/2017   Hypokalemia    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   Shunt malfunction 07/03/2016   Need for hepatitis C screening test 06/29/2015   Myalgia 05/17/2015   Secondary central sleep apnea 12/09/2014   Obesity hypoventilation syndrome ([place]) 12/09/2014   Narcotic drug use 12/09/2014   Hypersomnia with sleep apnea 12/09/2014   SCC (squamous cell carcinoma), arm 11/09/2014   Mild persistent chronic asthma without complication 88/28/0034   ESRD on dialysis ([place]) 09/09/2014   Essential hypertension 09/09/2014   HLD (hyperlipidemia) 09/09/2014   Pancytopenia ([place]) 04/28/2014   Thrombocytopenia ([place]) 04/10/2014   Fungal dermatitis 03/17/2014   S/p nephrectomy 09/17/2013   Anemia 09/03/2012   Uncontrolled hypertension    Tension headache    Memory loss    Edema    Thoracic or lumbosacral neuritis or radiculitis 03/27/2012   Nerve root pain 03/27/2012   History of kidney transplant 09/10/2011   Type 2 diabetes mellitus with diabetic nephropathy, without long-term current use of insulin ([place]) 08/30/2011   Hearing loss 08/16/2011   Obesity 08/16/2011   OSA on CPAP 08/05/2011   GIB (gastrointestinal bleeding) 10/28/2007    ONSET DATE: October 2023  REFERRING DIAG: S06.5X1A (ICD-10-CM) - Traumatic subdural hematoma with loss of consciousness of 30 minutes or less, initial encounter ([place]) I67.1 (ICD-10-CM) - Cerebral aneurysm  THERAPY DIAG:  No diagnosis found.  Rationale for Evaluation and Treatment: Rehabilitation  SUBJECTIVE:  SUBJECTIVE STATEMENT: Difficulty with blood pressure regulation  Pt accompanied by: significant other  PERTINENT HISTORY: 63 y.o. male whose history initially begins approximately 1 month ago when he presented to the emergency department with seizure.  His imaging revealed a small acute right convexity subdural hematoma.  At the time he did not require operative intervention.  Patient has subsequently presented with headache and further seizures which have been managed medically.  Repeat CT scan has demonstrated enlargement of the now subacute/chronic right frontal convexity subdural hematoma, with slightly worsened local mass effect and midline shift.    PAIN:  Are you having pain? Yes: NPRS scale: past 10/10 Pain location: lumbar spine Pain description: sore/ache/stab Aggravating factors: sitting too long Relieving factors: nothing  PRECAUTIONS: Fall  WEIGHT BEARING RESTRICTIONS: No  FALLS: Has patient fallen in last 6 months? Yes. Number of falls 1 big one and multiple falls backwards to chairs  LIVING ENVIRONMENT: Lives with: lives with their family and lives with their spouse Lives in: House/apartment Stairs: Yes: Internal: 7 to landing + 4 steps; on right going up and bilateral but cannot reach both Has following equipment at home: Single point cane  PLOF: Independent with household mobility with device  PATIENT GOALS: "not be dependent on cane"  OBJECTIVE:      TODAY'S TREATMENT: 01/30/22 Activity Comments                         HOME EXERCISE PROGRAM Last updated: 01/11/22 Access Code: [email or web address] URL: [email or web address] Date: 01/11/2022 Prepared by: [facility]  Exercises - Supine March  - 1 x daily - 5 x weekly - 2 sets - 10 reps - Bent Knee Fallouts  - 1 x daily - 5 x weekly - 2 sets - 10 reps - Supine  Lower Trunk Rotation  - 1 x daily - 5 x weekly - 2 sets - 10 reps - Sit to Stand with Counter Support  - 1 x daily - 5 x weekly - 2 sets - 5 reps     Below measures were taken at time of initial evaluation unless otherwise specified:   DIAGNOSTIC FINDINGS: imaging  COGNITION: Overall cognitive status: Impaired and very poor safety awareness and insight to his deficits   SENSATION: WFL  COORDINATION: RUE and RLE limitations > LUE/LLE  EDEMA:  none  MUSCLE TONE: WNL  MUSCLE LENGTH:   DTRs:  NT  POSTURE: WFL  LOWER EXTREMITY ROM:     WNL  LOWER EXTREMITY MMT:    RLE 3/5 gross stretngth LLE 4/5 gross strength  BED MOBILITY:  NT  TRANSFERS: Assistive device utilized: Single point cane  Sit to stand: Min A Stand to sit: Min A Chair to chair: Min A Floor:  NT  RAMP:  Level of Assistance: Min A Assistive device utilized: Single point cane Ramp Comments:   CURB:  Level of Assistance: Min A Assistive device utilized: Single point cane Curb Comments:   STAIRS: Level of Assistance: Min A Industrial/product designer Technique: Step to Pattern with Single Rail on Left Number of Stairs: 4  Height of Stairs: 4-6  Comments:   GAIT: Gait pattern:  unsteady throughout right LOB and wide BOS Distance walked: 75 Assistive device utilized: Single point cane Level of assistance: Min A Comments: very unsteady, recommend use of RW vs cane--pt resistant to this suggestion  FUNCTIONAL TESTS:  5 times sit to stand: 51.35 sec w/ min A Timed  up and go (TUG): 25.15 sec Berg Balance Scale: NT  PATIENT SURVEYS:       PATIENT EDUCATION: Education details: recommend use of RW vs cane due to unsteadiness and high risk for falls Person educated: Patient and Spouse Education method: Explanation Education comprehension:  pt is resistant to this recommendation  HOME EXERCISE PROGRAM: TBD  GOALS: Goals reviewed with patient? Yes  SHORT TERM GOALS: Target date:  01/11/2022    Patient will be independent in HEP to improve functional outcomes Baseline: Goal status: IN PROGRESS  2.  Demo functional transfers with supervision and good safety awareness requiring only occasional cues in sequence and safety Baseline: min A Goal status: IN PROGRESS  3.  Demo improved safety in functional mobility demonstrating supervision performance x 150 ft w/out LOB in order to reduce risk for falls Baseline: Min A w/ cane Goal status: IN PROGRESS  4.  Demo improved safety with functional mobility as evidenced by time 15 sec TUG test using least restrictive AD Baseline: 25 sec w/ min A and cane Goal status: IN PROGRESS    LONG TERM GOALS: Target date: 02/15/2022    Ambulate x 300 ft w/ least restrictive AD and modified independence on level surfaces, curb negotiation, ramp, and stair ambulation to improve safety in functional mobility and reduce need for caregiver assistance Baseline: Min A Goal status: IN PROGRESS  2.  Manifest improved BLE strength and dynamic balance as evidenced by time 15 sec 5xSTS Baseline: 51 sec requiring min A to complete Goal status: IN PROGRESS  3.  Perform functional transfers with modified independence and good safety awareness to reduce risk for falls Baseline: Min A, poor safety Goal status: IN PROGRESS  4.  Demo low risk for falls per time 12 seconds TUG test Baseline: 25+ w/ Min A required Goal status: IN PROGRESS    ASSESSMENT:  CLINICAL IMPRESSION: BP initially quite high and activities performed in a supine position to facilitate reduced exertion and for continued monitoring of BP with decrease after 20 min to 408 systolic. Required frequent cues and encouragement throughout session for improve control during exercise interventions.  Gait training reveals quite a degree of instability with instances of knees buckling and demo increased exertion with activity.  Overall, pt demonstrates pretty limited activity  tolerance and continued back pain and report of general discomfort. Recommend to patient and spouse use of RW vs cane at this time due to history of falling and continued LE buckling.   OBJECTIVE IMPAIRMENTS: Abnormal gait, decreased activity tolerance, decreased balance, decreased coordination, decreased endurance, decreased knowledge of use of DME, decreased mobility, difficulty walking, decreased strength, decreased safety awareness, improper body mechanics, and pain.   ACTIVITY LIMITATIONS: carrying, lifting, bending, standing, squatting, sleeping, stairs, transfers, toileting, dressing, reach over head, and locomotion level  PARTICIPATION LIMITATIONS: meal prep, cleaning, laundry, shopping, community activity, occupation, and yard work  PERSONAL FACTORS: Age, Time since onset of injury/illness/exacerbation, and 3+ comorbidities: multiple systems with numerous medical and physical co-morbidities  are also affecting patient's functional outcome.   REHAB POTENTIAL: Fair due to interaction of conditions  CLINICAL DECISION MAKING: Evolving/moderate complexity  EVALUATION COMPLEXITY: Moderate  PLAN:  PT FREQUENCY: 1-2x/week  PT DURATION: 8 weeks  PLANNED INTERVENTIONS: Therapeutic exercises, Therapeutic activity, Neuromuscular re-education, Balance training, Gait training, Patient/Family education, Self Care, Joint mobilization, Stair training, Vestibular training, Canalith repositioning, Orthotic/Fit training, DME instructions, Aquatic Therapy, Dry Needling, Electrical stimulation, Spinal mobilization, Cryotherapy, Moist heat, Splintting, Taping, Traction, Ionotophoresis '4mg'$ /ml Dexamethasone, and Manual  therapy  PLAN FOR NEXT SESSION: monitor vitals; transfer from elevated seat

## 2022-01-30 ENCOUNTER — Ambulatory Visit: Payer: Medicare Other | Admitting: Physical Therapy

## 2022-01-30 ENCOUNTER — Ambulatory Visit: Payer: Medicare Other

## 2022-01-30 ENCOUNTER — Ambulatory Visit: Payer: Medicare Other | Admitting: Occupational Therapy

## 2022-01-31 DIAGNOSIS — N186 End stage renal disease: Secondary | ICD-10-CM | POA: Diagnosis not present

## 2022-01-31 DIAGNOSIS — N2581 Secondary hyperparathyroidism of renal origin: Secondary | ICD-10-CM | POA: Diagnosis not present

## 2022-01-31 DIAGNOSIS — D509 Iron deficiency anemia, unspecified: Secondary | ICD-10-CM | POA: Diagnosis not present

## 2022-01-31 DIAGNOSIS — Z992 Dependence on renal dialysis: Secondary | ICD-10-CM | POA: Diagnosis not present

## 2022-01-31 DIAGNOSIS — D631 Anemia in chronic kidney disease: Secondary | ICD-10-CM | POA: Diagnosis not present

## 2022-02-01 ENCOUNTER — Encounter: Payer: Medicare Other | Admitting: Occupational Therapy

## 2022-02-01 ENCOUNTER — Ambulatory Visit: Payer: Medicare Other | Admitting: Physical Therapy

## 2022-02-02 DIAGNOSIS — N186 End stage renal disease: Secondary | ICD-10-CM | POA: Diagnosis not present

## 2022-02-02 DIAGNOSIS — D631 Anemia in chronic kidney disease: Secondary | ICD-10-CM | POA: Diagnosis not present

## 2022-02-02 DIAGNOSIS — Z992 Dependence on renal dialysis: Secondary | ICD-10-CM | POA: Diagnosis not present

## 2022-02-02 DIAGNOSIS — N2581 Secondary hyperparathyroidism of renal origin: Secondary | ICD-10-CM | POA: Diagnosis not present

## 2022-02-02 DIAGNOSIS — D509 Iron deficiency anemia, unspecified: Secondary | ICD-10-CM | POA: Diagnosis not present

## 2022-02-05 ENCOUNTER — Emergency Department (HOSPITAL_COMMUNITY)
Admission: EM | Admit: 2022-02-05 | Discharge: 2022-02-05 | Disposition: A | Discharge status: Home / Self Care | Payer: Medicare Other | Attending: Emergency Medicine | Admitting: Emergency Medicine

## 2022-02-05 ENCOUNTER — Other Ambulatory Visit: Payer: Self-pay

## 2022-02-05 ENCOUNTER — Emergency Department (HOSPITAL_COMMUNITY): Payer: Medicare Other

## 2022-02-05 ENCOUNTER — Encounter (HOSPITAL_COMMUNITY): Payer: Self-pay

## 2022-02-05 DIAGNOSIS — R519 Headache, unspecified: Secondary | ICD-10-CM | POA: Diagnosis not present

## 2022-02-05 DIAGNOSIS — R531 Weakness: Secondary | ICD-10-CM | POA: Insufficient documentation

## 2022-02-05 DIAGNOSIS — N2581 Secondary hyperparathyroidism of renal origin: Secondary | ICD-10-CM | POA: Diagnosis not present

## 2022-02-05 DIAGNOSIS — N186 End stage renal disease: Secondary | ICD-10-CM | POA: Insufficient documentation

## 2022-02-05 DIAGNOSIS — I5020 Unspecified systolic (congestive) heart failure: Secondary | ICD-10-CM | POA: Insufficient documentation

## 2022-02-05 DIAGNOSIS — Z992 Dependence on renal dialysis: Secondary | ICD-10-CM | POA: Diagnosis not present

## 2022-02-05 DIAGNOSIS — Z79899 Other long term (current) drug therapy: Secondary | ICD-10-CM | POA: Diagnosis not present

## 2022-02-05 DIAGNOSIS — D509 Iron deficiency anemia, unspecified: Secondary | ICD-10-CM | POA: Diagnosis not present

## 2022-02-05 DIAGNOSIS — D631 Anemia in chronic kidney disease: Secondary | ICD-10-CM | POA: Diagnosis not present

## 2022-02-05 LAB — CBC
HCT: 33.8 % — ABNORMAL LOW (ref 39.0–52.0)
Hemoglobin: 11.1 g/dL — ABNORMAL LOW (ref 13.0–17.0)
MCH: 32.5 pg (ref 26.0–34.0)
MCHC: 32.8 g/dL (ref 30.0–36.0)
MCV: 98.8 fL (ref 80.0–100.0)
Platelets: 83 10*3/uL — ABNORMAL LOW (ref 150–400)
RBC: 3.42 MIL/uL — ABNORMAL LOW (ref 4.22–5.81)
RDW: 14.3 % (ref 11.5–15.5)
WBC: 2.6 10*3/uL — ABNORMAL LOW (ref 4.0–10.5)
nRBC: 0 % (ref 0.0–0.2)

## 2022-02-05 LAB — BASIC METABOLIC PANEL
Anion gap: 11 (ref 5–15)
BUN: 17 mg/dL (ref 6–20)
CO2: 31 mmol/L (ref 22–32)
Calcium: 8.5 mg/dL — ABNORMAL LOW (ref 8.9–10.3)
Chloride: 96 mmol/L — ABNORMAL LOW (ref 98–111)
Creatinine, Ser: 7.26 mg/dL — ABNORMAL HIGH (ref 0.61–1.24)
GFR, Estimated: 8 mL/min — ABNORMAL LOW (ref >60)
Glucose, Bld: 116 mg/dL — ABNORMAL HIGH (ref 70–99)
Potassium: 3.2 mmol/L — ABNORMAL LOW (ref 3.5–5.1)
Sodium: 138 mmol/L (ref 135–145)

## 2022-02-05 NOTE — Discharge Instructions (Addendum)
You were seen in the emergency department today for your weakness.  While in the emergency department completely full history and physical exam.  Based on our findings we feel that you are suitable for outpatient management.  Please follow-up with physical therapy in order to begin performing strengthening exercises.  Sincerely, Clydie Braun, PGY-2

## 2022-02-05 NOTE — ED Notes (Signed)
Provider at the bedside.  

## 2022-02-05 NOTE — ED Provider Triage Note (Signed)
Emergency Medicine Provider Triage Evaluation Note  Kirk [name] , a 62 y.o. male  was evaluated in triage.  Pt complains of vomiting, weakness, headaches, disorientation worsening over last several days.  Recent traumatic subdural hematoma, he had surgery on November 9 for the same, and reports no repeat CT since then, feels like there is growing pressure in his head.  He continues to take his Keppra.  Patient is also on Monday Wednesday Friday dialysis patient, has not missed any sessions.  Review of Systems  Positive: Headache, dizziness, disorientation Negative: Chills, chest pain, numbness, tingling, diplopia  Physical Exam  BP (!) 166/79   Pulse 60   Temp 98.5 F (36.9 C) (Oral)   Resp 18   SpO2 97%  Gen:   Awake, no distress   Resp:  Normal effort  MSK:   Moves extremities without difficulty  Other:  Moves all 4 limbs spontaneously, CN II through XII grossly intact, can ambulate without difficulty, intact sensation throughout.   Medical Decision Making  Medically screening exam initiated at 3:07 PM.  Appropriate orders placed.  QUINTO TIPPY was informed that the remainder of the evaluation will be completed by another provider, this initial triage assessment does not replace that evaluation, and the importance of remaining in the ED until their evaluation is complete.  Workup initiated in triage    Anselmo Pickler, [place] 02/05/22 1507

## 2022-02-05 NOTE — ED Notes (Signed)
Ambulates to the restroom at this time with a steady gait 

## 2022-02-05 NOTE — ED Triage Notes (Signed)
Patient here for evaluation of emesis, generalized weakness, and intermittent disorientation that started a few days ago. Patient is on MWF dialysis last treatment today, is alert, oriented, and in no apparent distress at this time.
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
## 2022-02-06 ENCOUNTER — Ambulatory Visit: Payer: Medicare Other | Admitting: Occupational Therapy

## 2022-02-06 ENCOUNTER — Ambulatory Visit: Payer: Medicare Other

## 2022-02-07 DIAGNOSIS — D509 Iron deficiency anemia, unspecified: Secondary | ICD-10-CM | POA: Diagnosis not present

## 2022-02-07 DIAGNOSIS — D631 Anemia in chronic kidney disease: Secondary | ICD-10-CM | POA: Diagnosis not present

## 2022-02-07 DIAGNOSIS — N2581 Secondary hyperparathyroidism of renal origin: Secondary | ICD-10-CM | POA: Diagnosis not present

## 2022-02-07 DIAGNOSIS — N186 End stage renal disease: Secondary | ICD-10-CM | POA: Diagnosis not present

## 2022-02-07 DIAGNOSIS — Z992 Dependence on renal dialysis: Secondary | ICD-10-CM | POA: Diagnosis not present

## 2022-02-07 NOTE — Therapy (Incomplete)
OUTPATIENT PHYSICAL THERAPY NEURO TREATMENT   Patient Name: Kirk [name] MRN: [id number] DOB:1960/04/09, 62 y.o., male Today's Date: 02/07/2022   PCP: Sandrea Hughs, NP REFERRING PROVIDER: Consuella Lose, MD  END OF SESSION:      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)    Anxiety    Asthma    Asthma    moderate persistent   Carpal tunnel syndrome    Cellulitis and abscess of trunk    Cholelithiasis 07/13/2014   Chronic headaches    Debility, unspecified    Dermatophytosis of the body    Dysrhythmia    history of   Edema    End stage renal disease on dialysis ([place])    "MWF; [facility] in [place]" (10/21/2014)   Essential hypertension, benign    GERD (gastroesophageal reflux disease)    Gout, unspecified    HTN (hypertension)    Hypertrophy of prostate without urinary obstruction and other lower urinary tract symptoms (LUTS)    Hypotension, unspecified    Impotence of organic origin    Insomnia, unspecified    Kidney replaced by transplant    Localization-related (focal) (partial) epilepsy and epileptic syndromes with complex partial seizures, without mention of intractable epilepsy    12-15-19- Wife states he has NEVER had a seizure    Lumbago    Memory loss    OSA on CPAP    Other and unspecified hyperlipidemia    controlled /managed per wife    Other chronic nonalcoholic liver disease    Other malaise and fatigue    Other nonspecific abnormal serum enzyme levels    Pain in joint, lower leg    Pain in joint, upper arm    Pneumonia "several times"   PONV (postoperative nausea and vomiting)    Renal dialysis status(V45.11) 02/05/2010   restarted 01/02/13 ofter renal trransplant failure   Secondary hyperparathyroidism (of renal origin)    Shortness of breath    Sleep apnea    wears cpap     Tension headache    Unspecified constipation    Unspecified essential hypertension    Unspecified hereditary and idiopathic peripheral neuropathy    Unspecified vitamin D deficiency    Past Surgical History:  Procedure Laterality Date   AV FISTULA PLACEMENT Left ?2010   "forearm; at [facility]"   [place]  03/21/2011   CHOLECYSTECTOMY N/A 10/21/2014   Procedure: LAPAROSCOPIC CHOLECYSTECTOMY WITH INTRAOPERATIVE CHOLANGIOGRAM;  Surgeon: Autumn Messing III, MD;  Location: [facility];  Service: General;  Laterality: N/A;   COLONOSCOPY     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   IR ANGIO INTRA EXTRACRAN SEL INTERNAL CAROTID UNI R MOD SED  11/30/2021   IR ANGIOGRAM FOLLOW UP STUDY  11/30/2021   IR NEURO EACH ADD'L AFTER BASIC UNI RIGHT (MS)  11/30/2021   IR TRANSCATH/EMBOLIZ  11/30/2021   KIDNEY TRANSPLANT  08/17/2011   [facility]    LAPAROSCOPIC CHOLECYSTECTOMY  10/21/2014   w/IOC   LEFT HEART CATHETERIZATION WITH CORONARY ANGIOGRAM N/A 03/21/2011   Procedure: LEFT HEART CATHETERIZATION WITH CORONARY ANGIOGRAM;  Surgeon: [name], MD;  Location: [facility] CATH LAB;  Service: Cardiovascular;  Laterality: N/A;   NEPHRECTOMY  08/2013   removed transplaned kidney   POLYPECTOMY  POSTERIOR FUSION CERVICAL SPINE  06/25/2012   for spinal stenosis   RADIOLOGY WITH ANESTHESIA N/A 11/30/2021   Procedure: RIGHT MIDDLE MENINGEAL ARTERY EMBOLIZATION;  Surgeon: Radiologist, Medication, MD;  Location: [place];  Service: Radiology;  Laterality: N/A;   VASECTOMY  2010   Patient Active Problem List   Diagnosis Date Noted   Cerebral aneurysm 11/30/2021   Subdural hematoma ([place]) 11/30/2021   Traumatic subdural hematoma (SDH) ([facility]) 11/21/2021   SDH (subdural hematoma) ([place]) 11/08/2021   Seizure ([place]) 11/08/2021   Chronic left shoulder pain 09/21/2021   Chronic right shoulder pain 09/21/2021   Cough with hemoptysis 08/01/2021   Preop respiratory exam  03/02/2021   Intractable nausea and vomiting 01/09/2021   Abnormal CT of the chest 05/24/2020   Rotator cuff syndrome of right shoulder 40/08/6759   Chronic systolic heart failure ([place]) 09/25/2018   Fluid overload 09/17/2018   Hypertensive urgency 09/17/2018   Right knee pain 06/02/2018   Right tibial fracture 06/02/2018   Cigarette smoker 07/11/2017   Anxiety 05/30/2017   Hypokalemia    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   Shunt malfunction 07/03/2016   Need for hepatitis C screening test 06/29/2015   Myalgia 05/17/2015   Secondary central sleep apnea 12/09/2014   Obesity hypoventilation syndrome ([place]) 12/09/2014   Narcotic drug use 12/09/2014   Hypersomnia with sleep apnea 12/09/2014   SCC (squamous cell carcinoma), arm 11/09/2014   Mild persistent chronic asthma without complication 95/09/3265   ESRD on dialysis ([place]) 09/09/2014   Essential hypertension 09/09/2014   HLD (hyperlipidemia) 09/09/2014   Pancytopenia ([facility]) 04/28/2014   Thrombocytopenia ([place]) 04/10/2014   Fungal dermatitis 03/17/2014   S/p nephrectomy 09/17/2013   Anemia 09/03/2012   Uncontrolled hypertension    Tension headache    Memory loss    Edema    Thoracic or lumbosacral neuritis or radiculitis 03/27/2012   Nerve root pain 03/27/2012   History of kidney transplant 09/10/2011   Type 2 diabetes mellitus with diabetic nephropathy, without long-term current use of insulin ([place]) 08/30/2011   Hearing loss 08/16/2011   Obesity 08/16/2011   OSA on CPAP 08/05/2011   GIB (gastrointestinal bleeding) 10/28/2007    ONSET DATE: October 2023  REFERRING DIAG: S06.5X1A (ICD-10-CM) - Traumatic subdural hematoma with loss of consciousness of 30 minutes or less, initial encounter ([place]) I67.1 (ICD-10-CM) - Cerebral aneurysm  THERAPY DIAG:  No diagnosis found.  Rationale for Evaluation and Treatment: Rehabilitation  SUBJECTIVE:  SUBJECTIVE STATEMENT: Difficulty with blood pressure regulation  Pt accompanied by: significant other  PERTINENT HISTORY: 62 y.o. male whose history initially begins approximately 1 month ago when he presented to the emergency department with seizure.  His imaging revealed a small acute right convexity subdural hematoma.  At the time he did not require operative intervention.  Patient has subsequently presented with headache and further seizures which have been managed medically.  Repeat CT scan has demonstrated enlargement of the now subacute/chronic right frontal convexity subdural hematoma, with slightly worsened local mass effect and midline shift.    PAIN:  Are you having pain? Yes: NPRS scale: past 10/10 Pain location: lumbar spine Pain description: sore/ache/stab Aggravating factors: sitting too long Relieving factors: nothing  PRECAUTIONS: Fall  WEIGHT BEARING RESTRICTIONS: No  FALLS: Has patient fallen in last 6 months? Yes. Number of falls 1 big one and multiple falls backwards to chairs  LIVING ENVIRONMENT: Lives with: lives with their family and lives with their spouse Lives in: House/apartment Stairs: Yes: Internal: 7 to landing + 4 steps; on right going up and bilateral but cannot reach both Has following equipment at home: Single point cane  PLOF: Independent with household mobility with device  PATIENT GOALS: "not be dependent on cane"  OBJECTIVE:      TODAY'S TREATMENT: 02/08/22 Activity Comments                         HOME EXERCISE PROGRAM Last updated: 01/11/22 Access Code: [email or web address] URL: [email or web address] Date: 01/11/2022 Prepared by: [facility]  Exercises - Supine March  - 1 x daily - 5 x weekly - 2 sets - 10 reps - Bent Knee Fallouts  - 1 x daily - 5 x weekly - 2 sets - 10 reps - Supine  Lower Trunk Rotation  - 1 x daily - 5 x weekly - 2 sets - 10 reps - Sit to Stand with Counter Support  - 1 x daily - 5 x weekly - 2 sets - 5 reps     Below measures were taken at time of initial evaluation unless otherwise specified:   DIAGNOSTIC FINDINGS: imaging  COGNITION: Overall cognitive status: Impaired and very poor safety awareness and insight to his deficits   SENSATION: WFL  COORDINATION: RUE and RLE limitations > LUE/LLE  EDEMA:  none  MUSCLE TONE: WNL  MUSCLE LENGTH:   DTRs:  NT  POSTURE: WFL  LOWER EXTREMITY ROM:     WNL  LOWER EXTREMITY MMT:    RLE 3/5 gross stretngth LLE 4/5 gross strength  BED MOBILITY:  NT  TRANSFERS: Assistive device utilized: Single point cane  Sit to stand: Min A Stand to sit: Min A Chair to chair: Min A Floor:  NT  RAMP:  Level of Assistance: Min A Assistive device utilized: Single point cane Ramp Comments:   CURB:  Level of Assistance: Min A Assistive device utilized: Single point cane Curb Comments:   STAIRS: Level of Assistance: Min A Industrial/product designer Technique: Step to Pattern with Single Rail on Left Number of Stairs: 4  Height of Stairs: 4-6  Comments:   GAIT: Gait pattern:  unsteady throughout right LOB and wide BOS Distance walked: 75 Assistive device utilized: Single point cane Level of assistance: Min A Comments: very unsteady, recommend use of RW vs cane--pt resistant to this suggestion  FUNCTIONAL TESTS:  5 times sit to stand: 51.35 sec w/ min A Timed  up and go (TUG): 25.15 sec Berg Balance Scale: NT  PATIENT SURVEYS:       PATIENT EDUCATION: Education details: recommend use of RW vs cane due to unsteadiness and high risk for falls Person educated: Patient and Spouse Education method: Explanation Education comprehension:  pt is resistant to this recommendation  HOME EXERCISE PROGRAM: TBD  GOALS: Goals reviewed with patient? Yes  SHORT TERM GOALS: Target date:  01/11/2022    Patient will be independent in HEP to improve functional outcomes Baseline: Goal status: IN PROGRESS  2.  Demo functional transfers with supervision and good safety awareness requiring only occasional cues in sequence and safety Baseline: min A Goal status: IN PROGRESS  3.  Demo improved safety in functional mobility demonstrating supervision performance x 150 ft w/out LOB in order to reduce risk for falls Baseline: Min A w/ cane Goal status: IN PROGRESS  4.  Demo improved safety with functional mobility as evidenced by time 15 sec TUG test using least restrictive AD Baseline: 25 sec w/ min A and cane Goal status: IN PROGRESS    LONG TERM GOALS: Target date: 02/15/2022    Ambulate x 300 ft w/ least restrictive AD and modified independence on level surfaces, curb negotiation, ramp, and stair ambulation to improve safety in functional mobility and reduce need for caregiver assistance Baseline: Min A Goal status: IN PROGRESS  2.  Manifest improved BLE strength and dynamic balance as evidenced by time 15 sec 5xSTS Baseline: 51 sec requiring min A to complete Goal status: IN PROGRESS  3.  Perform functional transfers with modified independence and good safety awareness to reduce risk for falls Baseline: Min A, poor safety Goal status: IN PROGRESS  4.  Demo low risk for falls per time 12 seconds TUG test Baseline: 25+ w/ Min A required Goal status: IN PROGRESS    ASSESSMENT:  CLINICAL IMPRESSION: BP initially quite high and activities performed in a supine position to facilitate reduced exertion and for continued monitoring of BP with decrease after 20 min to 629 systolic. Required frequent cues and encouragement throughout session for improve control during exercise interventions.  Gait training reveals quite a degree of instability with instances of knees buckling and demo increased exertion with activity.  Overall, pt demonstrates pretty limited activity  tolerance and continued back pain and report of general discomfort. Recommend to patient and spouse use of RW vs cane at this time due to history of falling and continued LE buckling.   OBJECTIVE IMPAIRMENTS: Abnormal gait, decreased activity tolerance, decreased balance, decreased coordination, decreased endurance, decreased knowledge of use of DME, decreased mobility, difficulty walking, decreased strength, decreased safety awareness, improper body mechanics, and pain.   ACTIVITY LIMITATIONS: carrying, lifting, bending, standing, squatting, sleeping, stairs, transfers, toileting, dressing, reach over head, and locomotion level  PARTICIPATION LIMITATIONS: meal prep, cleaning, laundry, shopping, community activity, occupation, and yard work  PERSONAL FACTORS: Age, Time since onset of injury/illness/exacerbation, and 3+ comorbidities: multiple systems with numerous medical and physical co-morbidities  are also affecting patient's functional outcome.   REHAB POTENTIAL: Fair due to interaction of conditions  CLINICAL DECISION MAKING: Evolving/moderate complexity  EVALUATION COMPLEXITY: Moderate  PLAN:  PT FREQUENCY: 1-2x/week  PT DURATION: 8 weeks  PLANNED INTERVENTIONS: Therapeutic exercises, Therapeutic activity, Neuromuscular re-education, Balance training, Gait training, Patient/Family education, Self Care, Joint mobilization, Stair training, Vestibular training, Canalith repositioning, Orthotic/Fit training, DME instructions, Aquatic Therapy, Dry Needling, Electrical stimulation, Spinal mobilization, Cryotherapy, Moist heat, Splintting, Taping, Traction, Ionotophoresis '4mg'$ /ml Dexamethasone, and Manual  therapy  PLAN FOR NEXT SESSION: monitor vitals; transfer from elevated seat

## 2022-02-08 ENCOUNTER — Telehealth: Payer: Self-pay

## 2022-02-08 ENCOUNTER — Ambulatory Visit: Payer: Medicare Other | Admitting: Physical Therapy

## 2022-02-08 ENCOUNTER — Ambulatory Visit: Payer: Medicare Other

## 2022-02-08 LAB — LEVETIRACETAM LEVEL: Levetiracetam Lvl: 14.5 ug/mL (ref 10.0–40.0)

## 2022-02-08 NOTE — Patient Outreach (Signed)
  Care Coordination TOC Note Transition Care Management Follow-up Telephone Call Date of discharge and from where: 02/05/22-[facility]  DX: "Weakness" Red on EMMI-ED Discharge Alert Reason: "Scheduled follow-up appt? No" Red Alert Date: 02/07/22 How have you been since you were released from the hospital? Voicemail received from spouse returning RN CM call. Return call to the home. Spoke with spouse. She voices that patient is "not doing any better." He continues to have dizziness, balance off and short term memory loss." Spouse voices "I just want him back to normal." Support given to spouse. She verbalizes that she does not feel like therapy is making a difference. She is hopeful they will get more answers and a better treatment plan when patient goes for neuro appt in a few days.  Any questions or concerns? No  Items Reviewed: Did the pt receive and understand the discharge instructions provided? Yes  Medications obtained and verified? Yes  Other? No  Any new allergies since your discharge? No  Dietary orders reviewed? Yes Do you have support at home? Yes   Home Care and Equipment/Supplies: Were home health services ordered? not applicable If so, what is the name of the agency? N/A  Has the agency set up a time to come to the patient's home? not applicable Were any new equipment or medical supplies ordered?  No What is the name of the medical supply agency? N/A Were you able to get the supplies/equipment? not applicable Do you have any questions related to the use of the equipment or supplies? No  Functional Questionnaire: (I = Independent and D = Dependent) ADLs: A  Bathing/Dressing- A  Meal Prep- A  Eating- I  Maintaining continence- I  Transferring/Ambulation- A  Managing Meds- A  Follow up appointments reviewed:  PCP Hospital f/u appt confirmed? No  Spouse declined needing assistance-will make an appt after patient sees neuro MD. [facility] f/u appt confirmed?  Yes  Scheduled to see Dr. April Manson on 02/13/22 @ 9:45am. Are transportation arrangements needed? No  If their condition worsens, is the pt aware to call PCP or go to the Emergency Dept.? Yes Was the patient provided with contact information for the PCP's office or ED? Yes Was to pt encouraged to call back with questions or concerns? Yes  SDOH assessments and interventions completed:   Yes SDOH Interventions Today    Flowsheet Row Most Recent Value  SDOH Interventions   Food Insecurity Interventions Intervention Not Indicated  Transportation Interventions Intervention Not Indicated       Care Coordination Interventions:  Referred for Care Coordination Services:  RN Care Coordinator Education provided regarding sx mgmt and mgmt of chronic conditions    Encounter Outcome:  Pt. Visit Completed    [facility] [place] [facility] Telephonic Care Management Coordinator Direct Phone: [phone] Toll Free: [phone] Fax: [phone]

## 2022-02-08 NOTE — Patient Outreach (Signed)
  Care Coordination TOC Note Transition Care Management Unsuccessful Follow-up Telephone Call  Date of discharge and from where:  02/05/22-[facility]  Attempts:  1st Attempt  Reason for unsuccessful TCM follow-up call:  Left voice message   Enzo Montgomery, RN,BSN,CCM [facility] Telephonic Care Management Coordinator Direct Phone: [phone] Toll Free: [phone] Fax: [phone]

## 2022-02-09 DIAGNOSIS — D509 Iron deficiency anemia, unspecified: Secondary | ICD-10-CM | POA: Diagnosis not present

## 2022-02-09 DIAGNOSIS — N2581 Secondary hyperparathyroidism of renal origin: Secondary | ICD-10-CM | POA: Diagnosis not present

## 2022-02-09 DIAGNOSIS — Z992 Dependence on renal dialysis: Secondary | ICD-10-CM | POA: Diagnosis not present

## 2022-02-09 DIAGNOSIS — D631 Anemia in chronic kidney disease: Secondary | ICD-10-CM | POA: Diagnosis not present

## 2022-02-09 DIAGNOSIS — N186 End stage renal disease: Secondary | ICD-10-CM | POA: Diagnosis not present

## 2022-02-12 DIAGNOSIS — N186 End stage renal disease: Secondary | ICD-10-CM | POA: Diagnosis not present

## 2022-02-12 DIAGNOSIS — Z992 Dependence on renal dialysis: Secondary | ICD-10-CM | POA: Diagnosis not present

## 2022-02-12 DIAGNOSIS — D509 Iron deficiency anemia, unspecified: Secondary | ICD-10-CM | POA: Diagnosis not present

## 2022-02-12 DIAGNOSIS — D631 Anemia in chronic kidney disease: Secondary | ICD-10-CM | POA: Diagnosis not present

## 2022-02-12 DIAGNOSIS — N2581 Secondary hyperparathyroidism of renal origin: Secondary | ICD-10-CM | POA: Diagnosis not present

## 2022-02-13 ENCOUNTER — Encounter: Payer: Self-pay | Admitting: Neurology

## 2022-02-13 ENCOUNTER — Ambulatory Visit: Payer: Medicare Other | Admitting: Occupational Therapy

## 2022-02-13 ENCOUNTER — Ambulatory Visit: Payer: Medicare Other

## 2022-02-13 ENCOUNTER — Ambulatory Visit (INDEPENDENT_AMBULATORY_CARE_PROVIDER_SITE_OTHER): Payer: Medicare Other | Admitting: Neurology

## 2022-02-13 VITALS — BP 195/47 | HR 55 | Ht 67.0 in | Wt 199.0 lb

## 2022-02-13 DIAGNOSIS — Z5181 Encounter for therapeutic drug level monitoring: Secondary | ICD-10-CM | POA: Diagnosis not present

## 2022-02-13 DIAGNOSIS — I6200 Nontraumatic subdural hemorrhage, unspecified: Secondary | ICD-10-CM

## 2022-02-13 DIAGNOSIS — R569 Unspecified convulsions: Secondary | ICD-10-CM

## 2022-02-13 NOTE — Progress Notes (Signed)
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  Wife found patient October 17 with generalized convulsion, foaming at the mouth lasted about a minute followed by generalized weakness and confusion.  EMS was called and patient taken to the hospital.  In the ED he was found to have a subdural hematoma with mass effect.  Neurosurgery will was consulted, he did not need evacuation but did have a MMA embolization.  He was started on Keppra 750 daily with extra 250 on dialysis today.  Since then wife has not reported any additional seizures.  He was discharged in November.  Since being home, wife has reported some occasional confusion and staring spell but no seizures.  He still complains of chronic pain and has generalized weakness and malaise.    Handedness: Right handed   Onset: October 17   Seizure Type: Described as generalized convulsion   Current frequency: Only once   Any injuries from seizures: Denies   Seizure risk factors: Subdural Hematoma   Previous ASMs: None   Currenty ASMs: Levetiracetam 750 mg daily with extra 250 mg on dialysis day   ASMs side effects: Somnolence, fatigue   Brain Images: Resolution of the SDH   Previous EEGs: Generalized slowing with focal right hemispheric slowing     Hospital course below  Kirk [name] is a 62 y.o. 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 On exam on arrival to the ED he had new onset left-sided neglect and right forced gaze on 10/24 with resultant Code Stroke activation. Concern for seizure versus interval SDH bleeding with mild mass effect identified on CTH. Stroke was ruled out, with seizure due to subdural hematoma diagnosed at that time. Patient was initially started on Keppra then transitioned to Depakote due to headache. VPA level was subtherapeutic 10/23. Keppra was preferred to VPA in setting of bleed and head trauma and also given his hx thrombocytopenia. Therefore 10/23 VPA was d/c'd (w/o taper since subtherapeutic) and he was reloaded with and continued on Keppra. Lyrica, also an AED, was also added for headache at ESRD dosing. He was transferred to the Rehab service on 10/31. Since then, he has had recurrent spells managed with increased Keppra dosing to 750 mg QD (renally dosed) with 250 mg supplemental dosing on HD days. He had recurrence of spells again overnight 11/2-11/3. Neurology was re-consulted for repeat EEG and possible anticonvulsant medication titration.    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     amLODipine (NORVASC) 10 MG tablet Take 1 tablet (10 mg total) by mouth every evening.     calcitRIOL (ROCALTROL) 0.5 MCG capsule Take 2 capsules (1 mcg total) by mouth every Monday, Wednesday, and Friday with hemodialysis. 60 capsule 1   Cholecalciferol (VITAMIN D3) 25 MCG (1000 UT) CAPS Take 4,000 Units by mouth daily.     cinacalcet (SENSIPAR) 30 MG tablet Take 2 tablets (60 mg total) by mouth 3 (three) times a week. (Patient taking differently: Take 60 mg by mouth every evening.) 60 tablet    cloNIDine (CATAPRES) 0.2 MG tablet Take 1 tablet (0.2 mg total) by mouth every 4 (four) hours as needed (Sbp >170 or dbp>100). 60 tablet 11   doxazosin (CARDURA) 4 MG tablet TAKE 1 TABLET BY MOUTH EVERY DAY 90 tablet 1   guaiFENesin (MUCINEX) 600 MG 12 hr tablet Take 1,200 mg by mouth 2 (two) times daily.     hydrALAZINE (APRESOLINE) 100 MG tablet Take 1 tablet (100 mg total) by mouth every 8 (eight) hours. (Patient taking differently: Take 100 mg by mouth daily as needed (If BP >140).)     isosorbide mononitrate (IMDUR) 60 MG 24 hr tablet Take 1 tablet (60 mg total) by mouth daily.     levETIRAcetam (KEPPRA) 250 MG tablet Take 1 tablet (250 mg total) by mouth every Monday, Wednesday, and Friday with hemodialysis. 36 tablet 1   levETIRAcetam (KEPPRA) 750 MG tablet Take 1 tablet (750 mg total) by mouth daily. 48 tablet 1   losartan (COZAAR) 100 MG tablet Take 1 tablet (100 mg total) by mouth every evening. 90 tablet 1   methocarbamol (ROBAXIN) 750 MG tablet Take 1 tablet (750 mg total) by mouth 4 (four) times daily. (Patient taking differently: Take 750 mg by mouth every 6 (six) hours as needed for muscle spasms.)      metoprolol tartrate (LOPRESSOR) 100 MG tablet Take 1 tablet (100 mg total) by mouth 2 (two) times daily. 180 tablet 1   multivitamin (RENA-VIT) TABS tablet Take 1 tablet by mouth daily.  0   ondansetron (ZOFRAN-ODT) 4 MG disintegrating tablet TAKE 1 TABLET BY MOUTH EVERY 8 HOURS AS NEEDED FOR NAUSEA AND VOMITING 14 tablet 3   oxyCODONE (OXY IR/ROXICODONE) 5 MG immediate release tablet Take 1-2 tablets (5-10 mg total) by mouth every 4 (four) hours as needed for moderate pain. 30 tablet 0   pantoprazole (PROTONIX) 40 MG tablet Take 1 tablet (40 mg total) by mouth 2 (two) times daily before a meal.     pravastatin (PRAVACHOL) 40 MG tablet TAKE 1 TABLET BY MOUTH EVERY DAY IN THE EVENING (Patient taking differently: Take 40 mg by mouth daily.) 90 tablet 2   senna-docusate (SENOKOT-S) 8.6-50 MG tablet Take 1 tablet by mouth 2 (two) times daily.     sevelamer carbonate (RENVELA) 800 MG tablet Take  2 tablets (1,600 mg total) by mouth as needed (snacks). (Patient taking differently: Take 1,600 mg by mouth 2 (two) times daily with a meal.)     sevelamer carbonate (RENVELA) 800 MG tablet Take 4 tablets (3,200 mg total) by mouth 3 (three) times daily with meals.     sucralfate (CARAFATE) 1 GM/10ML suspension Take 10 mLs (1 g total) by mouth 4 (four) times daily -  with meals and at bedtime. 420 mL 0   traZODone (DESYREL) 50 MG tablet Take 1 tablet (50 mg total) by mouth at bedtime.     Darbepoetin Alfa (ARANESP) 60 MCG/0.3ML SOSY injection Inject 0.3 mLs (60 mcg total) into the skin every Saturday at 6 PM. 4.2 mL    nitroGLYCERIN (NITROSTAT) 0.4 MG SL tablet Place 1 tablet (0.4 mg total) under the tongue every 5 (five) minutes as needed for chest pain.  12   pregabalin (LYRICA) 75 MG capsule Take 1 capsule (75 mg total) by mouth daily. 30 capsule 3   No facility-administered medications prior to visit.    PAST MEDICAL HISTORY: Past Medical History:  Diagnosis Date   Acute edema of lung, unspecified     Acute, but ill-defined, cerebrovascular disease    Allergy    Anemia    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)    Anxiety    Asthma    Asthma    moderate persistent   Carpal tunnel syndrome    Cellulitis and abscess of trunk    Cholelithiasis 07/13/2014   Chronic headaches    Debility, unspecified    Dermatophytosis of the body    Dysrhythmia    history of   Edema    End stage renal disease on dialysis ([place])    "MWF; [facility]" (10/21/2014)   Essential hypertension, benign    GERD (gastroesophageal reflux disease)    Gout, unspecified    HTN (hypertension)    Hypertrophy of prostate without urinary obstruction and other lower urinary tract symptoms (LUTS)    Hypotension, unspecified    Impotence of organic origin    Insomnia, unspecified    Kidney replaced by transplant    Localization-related (focal) (partial) epilepsy and epileptic syndromes with complex partial seizures, without mention of intractable epilepsy    12-15-19- Wife states he has NEVER had a seizure    Lumbago    Memory loss    OSA on CPAP    Other and unspecified hyperlipidemia    controlled /managed per wife    Other chronic nonalcoholic liver disease    Other malaise and fatigue    Other nonspecific abnormal serum enzyme levels    Pain in joint, lower leg    Pain in joint, upper arm    Pneumonia "several times"   PONV (postoperative nausea and vomiting)    Renal dialysis status(V45.11) 02/05/2010   restarted 01/02/13 ofter renal trransplant failure   Secondary hyperparathyroidism (of renal origin)    Shortness of breath    Sleep apnea    wears cpap    Tension headache    Unspecified constipation    Unspecified essential hypertension    Unspecified hereditary and idiopathic peripheral neuropathy    Unspecified vitamin D deficiency     PAST SURGICAL HISTORY: Past Surgical History:  Procedure Laterality Date   AV FISTULA PLACEMENT Left ?2010   "forearm; at [facility]"   [place]  03/21/2011   CHOLECYSTECTOMY N/A 10/21/2014   Procedure:  LAPAROSCOPIC CHOLECYSTECTOMY WITH INTRAOPERATIVE CHOLANGIOGRAM;  Surgeon: Autumn Messing III, MD;  Location: [place];  Service: General;  Laterality: N/A;   COLONOSCOPY     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   IR ANGIO INTRA EXTRACRAN SEL INTERNAL CAROTID UNI R MOD SED  11/30/2021   IR ANGIOGRAM FOLLOW UP STUDY  11/30/2021   IR NEURO EACH ADD'L AFTER BASIC UNI RIGHT (MS)  11/30/2021   IR TRANSCATH/EMBOLIZ  11/30/2021   KIDNEY TRANSPLANT  08/17/2011   [facility]    LAPAROSCOPIC CHOLECYSTECTOMY  10/21/2014   w/IOC   LEFT HEART CATHETERIZATION WITH CORONARY ANGIOGRAM N/A 03/21/2011   Procedure: LEFT HEART CATHETERIZATION WITH CORONARY ANGIOGRAM;  Surgeon: [name], MD;  Location: [facility] CATH LAB;  Service: Cardiovascular;  Laterality: N/A;   NEPHRECTOMY  08/2013   removed transplaned kidney   POLYPECTOMY     POSTERIOR FUSION CERVICAL SPINE  06/25/2012   for spinal stenosis   RADIOLOGY WITH ANESTHESIA N/A 11/30/2021   Procedure: RIGHT MIDDLE MENINGEAL ARTERY EMBOLIZATION;  Surgeon: Radiologist, Medication, MD;  Location: [place];  Service: Radiology;  Laterality: N/A;   VASECTOMY  2010    FAMILY HISTORY: Family History  Adopted: Yes  Problem Relation Age of Onset   Colon cancer Neg Hx    Esophageal cancer Neg Hx    Rectal cancer Neg Hx    Stomach cancer Neg Hx    Colon polyps Neg Hx     SOCIAL HISTORY: Social History   Socioeconomic History   Marital status: Married    Spouse name: [name]   Number of children: 3   Years of education: Not on file   Highest education level: Not on file  Occupational History   Occupation: disabled    Employer: DISABLED  Tobacco Use   Smoking status: Former    Packs/day: 0.50    Years: 32.00    Total pack years: 16.00    Types: Cigarettes    Start date: 1993    Quit date: 02/24/2020    Years since quitting: 1.9   Smokeless  tobacco: Never  Vaping Use   Vaping Use: Never used  Substance and Sexual Activity   Alcohol use: No    Alcohol/week: 0.0 standard drinks of alcohol   Drug use: No   Sexual activity: Yes  Other Topics Concern   Not on file  Social History Narrative   Drinks 1 cup of caffeine daily.   Social Determinants of Health   Financial Resource Strain: Medium Risk (11/02/2021)   Overall Financial Resource Strain (CARDIA)    Difficulty of Paying Living Expenses: Somewhat hard  Food Insecurity: No Food Insecurity (02/08/2022)   Hunger Vital Sign    Worried About Running Out of Food in the Last Year: Never true    Ran Out of Food in the Last Year: Never true  Transportation Needs: No Transportation Needs (02/08/2022)   PRAPARE - Hydrologist (Medical): No    Lack of Transportation (Non-Medical): No  Physical Activity: Insufficiently Active (06/25/2017)   Exercise Vital Sign    Days of Exercise per Week: 3 days    Minutes of Exercise per Session: 20 min  Stress: No Stress Concern Present (06/25/2017)   [place]    Feeling of Stress : Only a little  Social Connections: Moderately Integrated (06/25/2017)   Social Connection and Isolation Panel [NHANES]    Frequency of Communication with Friends  and Family: More than three times a week    Frequency of Social Gatherings with Friends and Family: More than three times a week    Attends Religious Services: More than 4 times per year    Active Member of [employer] or Organizations: No    Attends Archivist Meetings: Never    Marital Status: Married  Human resources officer Violence: Not At Risk (11/30/2021)   Humiliation, Afraid, Rape, and Kick questionnaire    Fear of Current or Ex-Partner: No    Emotionally Abused: No    Physically Abused: No    Sexually Abused: No     PHYSICAL EXAM  GENERAL EXAM/CONSTITUTIONAL: Vitals:  Vitals:   02/13/22 0948   BP: (!) 195/47  Pulse: (!) 55  Weight: 199 lb (90.3 kg)  Height: '5\' 7"'$  (1.702 m)   Body mass index is 31.17 kg/m. Wt Readings from Last 3 Encounters:  02/13/22 199 lb (90.3 kg)  12/07/21 199 lb 9.6 oz (90.5 kg)  12/01/21 211 lb 10.3 oz (96 kg)   Patient is in no distress; well developed, nourished and groomed; neck is supple  EYES: Visual fields full to confrontation, Extraocular movements intacts,  No results found.  MUSCULOSKELETAL: Gait, strength, tone, movements noted in Neurologic exam below  NEUROLOGIC: MENTAL STATUS:     09/12/2021   11:27 AM  MMSE - Mini Mental State Exam  Orientation to time 4  Orientation to time comments year 2023, season summer, date N/A, day Tuesday, month August.  Orientation to Place 3  Orientation to Place-comments state [place], county [place], city [place], facility N/A, provider N/A.  Registration 3  Attention/ Calculation 2  Attention/Calculation-comments DL  Recall 3  Language- name 2 objects 2  Language- repeat 0  Language- follow 3 step command 3  Language- read & follow direction 1  Write a sentence 0  Write a sentence-comments I need to go finish today OK.  Copy design 1  Total score 22   awake, alert, oriented to month but does not know the year and state that Obama is the current president  Able to name, repeat and comprehend  Able to calculate the number of quarter in $1,75 but unable to spell EMPTY Mild psychomotor retardation   CRANIAL NERVE:  2nd, 3rd, 4th, 6th - Visual fields full to confrontation, extraocular muscles intact, no nystagmus 5th - facial sensation symmetric 7th - facial strength symmetric 8th - hearing intact 9th - palate elevates symmetrically, uvula midline 11th - shoulder shrug symmetric 12th - tongue protrusion midline  MOTOR:  normal bulk and tone, at least antigravity but limited to pain  SENSORY:  normal and symmetric to light touch  COORDINATION:   finger-nose-finger  GAIT/STATION:  Need assistance to stand and with ambulation. Antalgic gait.    DIAGNOSTIC DATA (LABS, IMAGING, TESTING) - I reviewed patient records, labs, notes, testing and imaging myself where available.  Lab Results  Component Value Date   WBC 2.6 (L) 02/05/2022   HGB 11.1 (L) 02/05/2022   HCT 33.8 (L) 02/05/2022   MCV 98.8 02/05/2022   PLT 83 (L) 02/05/2022      Component Value Date/Time   NA 138 02/05/2022 1515   NA 136 11/26/2013 0914   K 3.2 (L) 02/05/2022 1515   CL 96 (L) 02/05/2022 1515   CO2 31 02/05/2022 1515   GLUCOSE 116 (H) 02/05/2022 1515   BUN 17 02/05/2022 1515   BUN 66 (A) 05/25/2015 0000   BUN 66 (A) 05/25/2015 0000  CREATININE 7.26 (H) 02/05/2022 1515   CREATININE 11.65 (HH) 05/16/2021 1611   CREATININE 8.37 (H) 02/03/2018 1523   CALCIUM 8.5 (L) 02/05/2022 1515   CALCIUM 8.8 06/08/2015 0000   CALCIUM 7.9 (L) 01/19/2010 1016   PROT 6.5 11/15/2021 0302   PROT 6.7 08/27/2013 1351   ALBUMIN 3.1 (L) 12/01/2021 0839   ALBUMIN 3.6 08/27/2013 1351   AST 15 11/15/2021 0302   AST 11 (L) 05/16/2021 1611   ALT 11 11/15/2021 0302   ALT 9 05/16/2021 1611   ALKPHOS 63 11/15/2021 0302   BILITOT 1.6 (H) 11/15/2021 0302   BILITOT 0.4 05/16/2021 1611   GFRNONAA 8 (L) 02/05/2022 1515   GFRNONAA 5 (L) 05/16/2021 1611   GFRAA 4 (L) 07/15/2019 0704   Lab Results  Component Value Date   CHOL 91 05/25/2021   HDL 31 (L) 05/25/2021   LDLCALC 42 05/25/2021   TRIG 95 05/25/2021   Lab Results  Component Value Date   HGBA1C 4.8 05/25/2021   Lab Results  Component Value Date   VITAMINB12 302 05/16/2021   Lab Results  Component Value Date   TSH 2.245 11/16/2021    CT Head 02/05/2022 1. Previously noted mixed density right subdural hematoma is essentially resolved. There is trace residual dural thickening versus minimal residual right convexity collection measuring 1-2 mm maximum thickness. No acute hemorrhage is seen. 2. Atrophy and  minimal chronic small vessel ischemic changes of the white matter.   CT Head 11/07/2021 Acute subdural hematoma overlying the right frontal convexity measuring 9 mm in greatest radial diameter. Mild adjacent mass effect and 1-2 mm of leftward midline shift at the septum pellucidum.   EEG 11/24/2021 - Continuous slow, right parieto-occipital region   IMPRESSION: This study is suggestive of cortical dysfunction arising from right parieto-occipital region likely secondary to underlying SDH. No seizures or definite epileptiform discharges were seen throughout the recording.   I personally reviewed brain Images and previous EEG reports.   ASSESSMENT AND PLAN  62 y.o. 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  He was started on Keppra 750 mg daily with additional 250 mg on dialysis today with no additional seizures.  I will check a Keppra level and if within the normal range we will continue patient on the same dose but if elevated would likely decrease the Keppra.  I will see him in 6 months and if no additional seizures we will likely discontinue the Keppra.  This was discussed with patient and wife and they are comfortable with plans.   1. Seizures ([place])   2. Therapeutic drug monitoring   3. Subdural hemorrhage (HCC)     Patient Instructions  Continue with Keppra 750 mg daily with additional 250 on dialysis day. Will check a Keppra level if elevated will likely decrease the Keppra to 500 mg daily Continue your other medications Follow-up in 6 months, at that time if no report of seizures, will likely discontinue medication     Per [facility] statutes, patients with seizures  are not allowed to drive until they have been seizure-free for six months.  Other recommendations include using caution when using heavy equipment or power tools. Avoid working on ladders or at heights.  Take showers instead of baths.  Do not swim alone.  Ensure the water temperature is not too high on the home water heater. Do not go swimming alone. Do not lock yourself in a room alone (i.e. bathroom). When caring for infants or small children, sit down when holding, feeding, or changing them to minimize risk of injury to the child in the event you have a seizure. Maintain good sleep hygiene. Avoid alcohol.  Also recommend adequate sleep, hydration, good diet and minimize stress.   During the Seizure  - First, ensure adequate ventilation and place patients on the floor on their left side  Loosen clothing around the neck and ensure the airway is patent. If the patient is clenching the teeth, do not force the mouth open with any object as this can cause severe damage - Remove all items from the surrounding that can be hazardous. The patient may be oblivious to what's happening and may not even know what he or she is doing. If the patient is confused and wandering, either gently guide him/her away and block access to outside areas - Reassure the individual and be comforting - Call 911. In most cases, the seizure ends before EMS arrives. However, there are cases when seizures may last over 3 to 5 minutes. Or the individual may have developed breathing difficulties or severe injuries. If a pregnant patient or a person with diabetes develops a seizure, it is prudent to call an ambulance. - Finally, if the patient does not regain full consciousness, then call EMS. Most patients will remain confused for about 45 to 90 minutes after a seizure, so you must use judgment in calling for help. - Avoid restraints but make sure the patient is in a bed with padded side rails - Place the individual in a lateral position  with the neck slightly flexed; this will help the saliva drain from the mouth and prevent the tongue from falling backward - Remove all nearby furniture and other hazards from the area - Provide verbal assurance as the individual is regaining consciousness - Provide the patient with privacy if possible - Call for help and start treatment as ordered by the caregiver   After the Seizure (Postictal Stage)  After a seizure, most patients experience confusion, fatigue, muscle pain and/or a headache. Thus, one should permit the individual to sleep. For the next few days, reassurance is essential. Being calm and helping reorient the person is also of importance.  Most seizures are painless and end spontaneously. Seizures are not harmful to others but can lead to complications such as stress on the lungs, brain and the heart. Individuals with prior lung problems may develop labored breathing and respiratory distress.     Orders Placed This Encounter  Procedures   Levetiracetam level    No orders of the defined types were placed in this encounter.   Return in about 6 months (around 08/14/2022).    Alric Ran, MD 02/13/2022, 10:21 AM  [facility] [address], [place], [place] [address] [phone]

## 2022-02-13 NOTE — Patient Instructions (Addendum)
Continue with Keppra 750 mg daily with additional 250 on dialysis day. Will check a Keppra level if elevated will likely decrease the Keppra to 500 mg daily Continue your other medications Follow-up in 6 months, at that time if no report of seizures, will likely discontinue medication

## 2022-02-14 DIAGNOSIS — D509 Iron deficiency anemia, unspecified: Secondary | ICD-10-CM | POA: Diagnosis not present

## 2022-02-14 DIAGNOSIS — Z992 Dependence on renal dialysis: Secondary | ICD-10-CM | POA: Diagnosis not present

## 2022-02-14 DIAGNOSIS — D631 Anemia in chronic kidney disease: Secondary | ICD-10-CM | POA: Diagnosis not present

## 2022-02-14 DIAGNOSIS — N186 End stage renal disease: Secondary | ICD-10-CM | POA: Diagnosis not present

## 2022-02-14 DIAGNOSIS — N2581 Secondary hyperparathyroidism of renal origin: Secondary | ICD-10-CM | POA: Diagnosis not present

## 2022-02-15 ENCOUNTER — Ambulatory Visit: Payer: Medicare Other | Admitting: Physical Therapy

## 2022-02-15 ENCOUNTER — Ambulatory Visit: Payer: Medicare Other | Admitting: Occupational Therapy

## 2022-02-15 ENCOUNTER — Ambulatory Visit: Payer: Medicare Other

## 2022-02-15 LAB — LEVETIRACETAM LEVEL: Levetiracetam Lvl: 3 ug/mL — ABNORMAL LOW (ref 10.0–40.0)

## 2022-02-16 DIAGNOSIS — D631 Anemia in chronic kidney disease: Secondary | ICD-10-CM | POA: Diagnosis not present

## 2022-02-16 DIAGNOSIS — N186 End stage renal disease: Secondary | ICD-10-CM | POA: Diagnosis not present

## 2022-02-16 DIAGNOSIS — D509 Iron deficiency anemia, unspecified: Secondary | ICD-10-CM | POA: Diagnosis not present

## 2022-02-16 DIAGNOSIS — Z992 Dependence on renal dialysis: Secondary | ICD-10-CM | POA: Diagnosis not present

## 2022-02-16 DIAGNOSIS — N2581 Secondary hyperparathyroidism of renal origin: Secondary | ICD-10-CM | POA: Diagnosis not present

## 2022-02-19 DIAGNOSIS — Z992 Dependence on renal dialysis: Secondary | ICD-10-CM | POA: Diagnosis not present

## 2022-02-19 DIAGNOSIS — N2581 Secondary hyperparathyroidism of renal origin: Secondary | ICD-10-CM | POA: Diagnosis not present

## 2022-02-19 DIAGNOSIS — D631 Anemia in chronic kidney disease: Secondary | ICD-10-CM | POA: Diagnosis not present

## 2022-02-19 DIAGNOSIS — D509 Iron deficiency anemia, unspecified: Secondary | ICD-10-CM | POA: Diagnosis not present

## 2022-02-19 DIAGNOSIS — N186 End stage renal disease: Secondary | ICD-10-CM | POA: Diagnosis not present

## 2022-02-20 ENCOUNTER — Other Ambulatory Visit: Payer: Self-pay | Admitting: Neurology

## 2022-02-20 ENCOUNTER — Telehealth: Payer: Self-pay | Admitting: Neurology

## 2022-02-20 MED ORDER — LEVETIRACETAM 750 MG PO TABS: 750.0000 mg | ORAL_TABLET | Freq: Every day | ORAL | 4 refills | Status: DC

## 2022-02-20 MED ORDER — LEVETIRACETAM 250 MG PO TABS: 250.0000 mg | ORAL_TABLET | ORAL | 3 refills | Status: DC

## 2022-02-20 NOTE — Telephone Encounter (Signed)
Called and spoke to wife and patient, she stated that he was prescribed '250mg'$  while in the hospital to take with dialysis, but the prescription never made it to the pharmacy. I advised her to reach back out to the provider that prescribed and she stated her gratitude and I relayed the message from Dr. April Manson. Wife had no questions at this time but was encouraged to call back if questions arise.

## 2022-02-20 NOTE — Telephone Encounter (Signed)
Please ask the wife if she has the 250 mg tablets at home, if so, he can just take it right after dialysis.   Thank you

## 2022-02-20 NOTE — Telephone Encounter (Signed)
I will send a prescription for Keppra 750 mg and 250 mg to the pharmacy.

## 2022-02-20 NOTE — Telephone Encounter (Signed)
Pt's wife Marcene Corning (do not have a DPR in the chart) wanted to make Dr. April Manson aware Keppra 250 mg 1 tablet on Monday, Wednesday and Friday was suppose to be administered at dialysis.. Orders were in from hospital but facility did not administer the medication.

## 2022-02-21 ENCOUNTER — Other Ambulatory Visit: Payer: Medicare Other

## 2022-02-21 DIAGNOSIS — N2581 Secondary hyperparathyroidism of renal origin: Secondary | ICD-10-CM | POA: Diagnosis not present

## 2022-02-21 DIAGNOSIS — D509 Iron deficiency anemia, unspecified: Secondary | ICD-10-CM | POA: Diagnosis not present

## 2022-02-21 DIAGNOSIS — N186 End stage renal disease: Secondary | ICD-10-CM | POA: Diagnosis not present

## 2022-02-21 DIAGNOSIS — Z992 Dependence on renal dialysis: Secondary | ICD-10-CM | POA: Diagnosis not present

## 2022-02-21 DIAGNOSIS — D631 Anemia in chronic kidney disease: Secondary | ICD-10-CM | POA: Diagnosis not present

## 2022-02-22 ENCOUNTER — Ambulatory Visit: Payer: Self-pay

## 2022-02-22 DIAGNOSIS — N186 End stage renal disease: Secondary | ICD-10-CM | POA: Diagnosis not present

## 2022-02-22 DIAGNOSIS — Z992 Dependence on renal dialysis: Secondary | ICD-10-CM | POA: Diagnosis not present

## 2022-02-22 DIAGNOSIS — T8612 Kidney transplant failure: Secondary | ICD-10-CM | POA: Diagnosis not present

## 2022-02-22 NOTE — Patient Outreach (Signed)
  Care Coordination   02/22/2022 Name: Kirk [name] MRN: [id number] DOB: [birth year]   Care Coordination Outreach Attempts:  An unsuccessful telephone outreach was attempted for a scheduled appointment today.  Follow Up Plan:  Additional outreach attempts will be made to offer the patient care coordination information and services.   Encounter Outcome:  No Answer   Care Coordination Interventions:  No, not indicated    Barb Merino, RN, BSN, CCM Care Management Coordinator [facility] Care Management  Direct Phone: [phone]

## 2022-02-23 DIAGNOSIS — D631 Anemia in chronic kidney disease: Secondary | ICD-10-CM | POA: Diagnosis not present

## 2022-02-23 DIAGNOSIS — D509 Iron deficiency anemia, unspecified: Secondary | ICD-10-CM | POA: Diagnosis not present

## 2022-02-23 DIAGNOSIS — N2581 Secondary hyperparathyroidism of renal origin: Secondary | ICD-10-CM | POA: Diagnosis not present

## 2022-02-23 DIAGNOSIS — Z992 Dependence on renal dialysis: Secondary | ICD-10-CM | POA: Diagnosis not present

## 2022-02-23 DIAGNOSIS — N186 End stage renal disease: Secondary | ICD-10-CM | POA: Diagnosis not present

## 2022-02-26 DIAGNOSIS — N2581 Secondary hyperparathyroidism of renal origin: Secondary | ICD-10-CM | POA: Diagnosis not present

## 2022-02-26 DIAGNOSIS — Z992 Dependence on renal dialysis: Secondary | ICD-10-CM | POA: Diagnosis not present

## 2022-02-26 DIAGNOSIS — D631 Anemia in chronic kidney disease: Secondary | ICD-10-CM | POA: Diagnosis not present

## 2022-02-26 DIAGNOSIS — N186 End stage renal disease: Secondary | ICD-10-CM | POA: Diagnosis not present

## 2022-02-26 DIAGNOSIS — D509 Iron deficiency anemia, unspecified: Secondary | ICD-10-CM | POA: Diagnosis not present

## 2022-02-27 ENCOUNTER — Other Ambulatory Visit: Payer: Medicare Other

## 2022-02-27 ENCOUNTER — Ambulatory Visit: Payer: Medicare Other | Admitting: Hematology and Oncology

## 2022-02-28 ENCOUNTER — Encounter: Payer: Self-pay | Admitting: Family

## 2022-02-28 ENCOUNTER — Ambulatory Visit (INDEPENDENT_AMBULATORY_CARE_PROVIDER_SITE_OTHER): Payer: Medicare Other | Admitting: Family

## 2022-02-28 VITALS — BP 122/88 | HR 62 | Temp 98.1°F | Resp 18 | Ht 67.0 in | Wt 198.5 lb

## 2022-02-28 DIAGNOSIS — E1121 Type 2 diabetes mellitus with diabetic nephropathy: Secondary | ICD-10-CM

## 2022-02-28 DIAGNOSIS — M79642 Pain in left hand: Secondary | ICD-10-CM

## 2022-02-28 DIAGNOSIS — N2581 Secondary hyperparathyroidism of renal origin: Secondary | ICD-10-CM | POA: Diagnosis not present

## 2022-02-28 DIAGNOSIS — M5412 Radiculopathy, cervical region: Secondary | ICD-10-CM

## 2022-02-28 DIAGNOSIS — G8929 Other chronic pain: Secondary | ICD-10-CM

## 2022-02-28 DIAGNOSIS — D631 Anemia in chronic kidney disease: Secondary | ICD-10-CM | POA: Diagnosis not present

## 2022-02-28 DIAGNOSIS — M79641 Pain in right hand: Secondary | ICD-10-CM

## 2022-02-28 DIAGNOSIS — M545 Low back pain, unspecified: Secondary | ICD-10-CM

## 2022-02-28 DIAGNOSIS — F339 Major depressive disorder, recurrent, unspecified: Secondary | ICD-10-CM

## 2022-02-28 DIAGNOSIS — F411 Generalized anxiety disorder: Secondary | ICD-10-CM | POA: Diagnosis not present

## 2022-02-28 DIAGNOSIS — E782 Mixed hyperlipidemia: Secondary | ICD-10-CM

## 2022-02-28 DIAGNOSIS — R112 Nausea with vomiting, unspecified: Secondary | ICD-10-CM

## 2022-02-28 DIAGNOSIS — N186 End stage renal disease: Secondary | ICD-10-CM

## 2022-02-28 DIAGNOSIS — I120 Hypertensive chronic kidney disease with stage 5 chronic kidney disease or end stage renal disease: Secondary | ICD-10-CM

## 2022-02-28 DIAGNOSIS — D509 Iron deficiency anemia, unspecified: Secondary | ICD-10-CM | POA: Diagnosis not present

## 2022-02-28 DIAGNOSIS — G44209 Tension-type headache, unspecified, not intractable: Secondary | ICD-10-CM | POA: Diagnosis not present

## 2022-02-28 DIAGNOSIS — R569 Unspecified convulsions: Secondary | ICD-10-CM | POA: Diagnosis not present

## 2022-02-28 DIAGNOSIS — Z992 Dependence on renal dialysis: Secondary | ICD-10-CM | POA: Diagnosis not present

## 2022-02-28 MED ORDER — ONDANSETRON 4 MG PO TBDP: ORAL_TABLET | ORAL | 3 refills | Status: DC

## 2022-02-28 MED ORDER — DULOXETINE HCL 30 MG PO CPEP: 30.0000 mg | ORAL_CAPSULE | Freq: Every day | ORAL | 3 refills | Status: DC

## 2022-02-28 MED ORDER — LEVETIRACETAM 250 MG PO TABS: 250.0000 mg | ORAL_TABLET | ORAL | 0 refills | Status: DC
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
## 2022-03-01 ENCOUNTER — Inpatient Hospital Stay: Payer: Medicare Other | Admitting: Family

## 2022-03-02 DIAGNOSIS — N186 End stage renal disease: Secondary | ICD-10-CM | POA: Diagnosis not present

## 2022-03-02 DIAGNOSIS — N2581 Secondary hyperparathyroidism of renal origin: Secondary | ICD-10-CM | POA: Diagnosis not present

## 2022-03-02 DIAGNOSIS — D631 Anemia in chronic kidney disease: Secondary | ICD-10-CM | POA: Diagnosis not present

## 2022-03-02 DIAGNOSIS — D509 Iron deficiency anemia, unspecified: Secondary | ICD-10-CM | POA: Diagnosis not present

## 2022-03-02 DIAGNOSIS — Z992 Dependence on renal dialysis: Secondary | ICD-10-CM | POA: Diagnosis not present

## 2022-03-05 ENCOUNTER — Other Ambulatory Visit: Payer: Self-pay

## 2022-03-05 DIAGNOSIS — D509 Iron deficiency anemia, unspecified: Secondary | ICD-10-CM | POA: Diagnosis not present

## 2022-03-05 DIAGNOSIS — Z992 Dependence on renal dialysis: Secondary | ICD-10-CM | POA: Diagnosis not present

## 2022-03-05 DIAGNOSIS — N186 End stage renal disease: Secondary | ICD-10-CM | POA: Diagnosis not present

## 2022-03-05 DIAGNOSIS — D61818 Other pancytopenia: Secondary | ICD-10-CM

## 2022-03-05 DIAGNOSIS — N2581 Secondary hyperparathyroidism of renal origin: Secondary | ICD-10-CM | POA: Diagnosis not present

## 2022-03-05 DIAGNOSIS — D631 Anemia in chronic kidney disease: Secondary | ICD-10-CM | POA: Diagnosis not present

## 2022-03-06 ENCOUNTER — Inpatient Hospital Stay: Payer: Medicare Other | Attending: Nurse Practitioner

## 2022-03-06 ENCOUNTER — Other Ambulatory Visit: Payer: Self-pay

## 2022-03-06 ENCOUNTER — Inpatient Hospital Stay (HOSPITAL_BASED_OUTPATIENT_CLINIC_OR_DEPARTMENT_OTHER): Payer: Medicare Other | Admitting: Hematology and Oncology

## 2022-03-06 VITALS — BP 178/72 | HR 56 | Temp 99.1°F | Resp 18 | Wt 201.2 lb

## 2022-03-06 DIAGNOSIS — R519 Headache, unspecified: Secondary | ICD-10-CM | POA: Insufficient documentation

## 2022-03-06 DIAGNOSIS — Z79899 Other long term (current) drug therapy: Secondary | ICD-10-CM | POA: Insufficient documentation

## 2022-03-06 DIAGNOSIS — M549 Dorsalgia, unspecified: Secondary | ICD-10-CM | POA: Diagnosis not present

## 2022-03-06 DIAGNOSIS — D61818 Other pancytopenia: Secondary | ICD-10-CM

## 2022-03-06 LAB — CMP (CANCER CENTER ONLY)
ALT: 10 U/L (ref 0–44)
AST: 10 U/L — ABNORMAL LOW (ref 15–41)
Albumin: 3.9 g/dL (ref 3.5–5.0)
Alkaline Phosphatase: 75 U/L (ref 38–126)
Anion gap: 8 (ref 5–15)
BUN: 39 mg/dL — ABNORMAL HIGH (ref 6–20)
CO2: 35 mmol/L — ABNORMAL HIGH (ref 22–32)
Calcium: 8.5 mg/dL — ABNORMAL LOW (ref 8.9–10.3)
Chloride: 98 mmol/L (ref 98–111)
Creatinine: 9.46 mg/dL (ref 0.61–1.24)
GFR, Estimated: 6 mL/min — ABNORMAL LOW (ref >60)
Glucose, Bld: 99 mg/dL (ref 70–99)
Potassium: 4.1 mmol/L (ref 3.5–5.1)
Sodium: 141 mmol/L (ref 135–145)
Total Bilirubin: 0.5 mg/dL (ref 0.3–1.2)
Total Protein: 6.3 g/dL — ABNORMAL LOW (ref 6.5–8.1)

## 2022-03-06 LAB — CBC WITH DIFFERENTIAL (CANCER CENTER ONLY)
Abs Immature Granulocytes: 0.01 10*3/uL (ref 0.00–0.07)
Basophils Absolute: 0 10*3/uL (ref 0.0–0.1)
Basophils Relative: 1 %
Eosinophils Absolute: 0 10*3/uL (ref 0.0–0.5)
Eosinophils Relative: 2 %
HCT: 32.5 % — ABNORMAL LOW (ref 39.0–52.0)
Hemoglobin: 10.7 g/dL — ABNORMAL LOW (ref 13.0–17.0)
Immature Granulocytes: 0 %
Lymphocytes Relative: 24 %
Lymphs Abs: 0.6 10*3/uL — ABNORMAL LOW (ref 0.7–4.0)
MCH: 31.4 pg (ref 26.0–34.0)
MCHC: 32.9 g/dL (ref 30.0–36.0)
MCV: 95.3 fL (ref 80.0–100.0)
Monocytes Absolute: 0.4 10*3/uL (ref 0.1–1.0)
Monocytes Relative: 15 %
Neutro Abs: 1.4 10*3/uL — ABNORMAL LOW (ref 1.7–7.7)
Neutrophils Relative %: 58 %
Platelet Count: 48 10*3/uL — ABNORMAL LOW (ref 150–400)
RBC: 3.41 MIL/uL — ABNORMAL LOW (ref 4.22–5.81)
RDW: 13.2 % (ref 11.5–15.5)
WBC Count: 2.4 10*3/uL — ABNORMAL LOW (ref 4.0–10.5)
nRBC: 0 % (ref 0.0–0.2)

## 2022-03-06 LAB — VITAMIN B12: Vitamin B-12: 327 pg/mL (ref 180–914)

## 2022-03-06 NOTE — Assessment & Plan Note (Addendum)
03/03/2020: WBC 4.8, hemoglobin 9.8, platelets 84 01/11/2021: WBC 3.6, hemoglobin 11.6, platelets 98, creatinine 7.84 03/21/2021: WBC 2.5, hemoglobin 8.3, MCV 98.5, platelets 73, creatinine 15.97 05/16/2021: WBC 3.1, hemoglobin 11, MCV 100.6, platelets 109, SPEP: No M spike, ANA negative, creatinine 11.65, reticulocyte count absolute 104.8, reticulocyte percentage: 3.1%, erythropoietin 163.8, iron saturation 25%, ferritin 827, folate 7.6, B12 302 08/01/2021: RBC 3, hemoglobin 9.6, platelets 91 08/24/2021: WBC 4.5, hemoglobin 10, platelets 86 03/06/2022: WBC 2.4, hemoglobin 10.7, platelets 48, ANC 1.4, ALC 0.6   Patient has multiple chronic illnesses including end-stage kidney disease on hemodialysis, [place] syndrome, anemia of chronic kidney disease, CHF, GI bleed, diabetes    For decreased energy: I discussed with him about taking sublingual B12. Return to clinic in 6 months for follow-up

## 2022-03-06 NOTE — Progress Notes (Signed)
Patient Care Team: Ngetich, Nelda Bucks, NP as PCP - General (Family Medicine) Debara Pickett Nadean Corwin, MD as PCP - Cardiology (Cardiology) Jovita Kussmaul, MD as Consulting Physician (General Surgery) [name], MD as Consulting Physician (Pulmonary Disease) Estanislado Emms, MD (Inactive) as Consulting Physician (Nephrology) Elam Dutch, MD (Inactive) as Consulting Physician (Vascular Surgery) [name], MD as Consulting Physician (Cardiology) Milus Banister, MD as Attending Physician (Gastroenterology) Jamal Maes, MD as Consulting Physician (Nephrology)  DIAGNOSIS:  Encounter Diagnosis  Name Primary?   Pancytopenia ([place]) Yes    CHIEF COMPLIANT: Follow-up Pancytopenia   INTERVAL HISTORY: Kirk [name] is a 62 y.o with the above-mention. He presents to the clinic today for a follow-up. He states that he is having pain in the back and complains of headaches. He was hospitalized back in October because of grand mal seizures.  During that hospitalization he was found to have severe anemia and required blood transfusions.   ALLERGIES:  is allergic to codeine and hydrocodone-acetaminophen.  MEDICATIONS:  Current Outpatient Medications  Medication Sig Dispense Refill   acetaminophen (TYLENOL) 500 MG tablet Take 1 tablet (500 mg total) by mouth every 6 (six) hours as needed for moderate pain.     amLODipine (NORVASC) 10 MG tablet Take 1 tablet (10 mg total) by mouth every evening.     calcitRIOL (ROCALTROL) 0.5 MCG capsule Take 2 capsules (1 mcg total) by mouth every Monday, Wednesday, and Friday with hemodialysis. 60 capsule 1   Cholecalciferol (VITAMIN D3) 25 MCG (1000 UT) CAPS Take 4,000 Units by mouth daily.     cinacalcet (SENSIPAR) 30 MG tablet Take 2 tablets (60 mg total) by mouth 3 (three) times a week. 60 tablet    cloNIDine (CATAPRES) 0.2 MG tablet Take 1 tablet (0.2 mg total) by mouth every 4 (four) hours as needed (Sbp >170 or dbp>100). 60 tablet 11   doxazosin  (CARDURA) 4 MG tablet TAKE 1 TABLET BY MOUTH EVERY DAY 90 tablet 1   DULoxetine (CYMBALTA) 30 MG capsule Take 1 capsule (30 mg total) by mouth daily. 30 capsule 3   FOSRENOL 1000 MG PACK Take 1 packet by mouth 3 (three) times daily.     guaiFENesin (MUCINEX) 600 MG 12 hr tablet Take 1,200 mg by mouth 2 (two) times daily.     hydrALAZINE (APRESOLINE) 100 MG tablet Take 1 tablet (100 mg total) by mouth every 8 (eight) hours.     isosorbide mononitrate (IMDUR) 60 MG 24 hr tablet Take 1 tablet (60 mg total) by mouth daily.     levETIRAcetam (KEPPRA) 250 MG tablet Take 1 tablet (250 mg total) by mouth every Monday, Wednesday, and Friday with hemodialysis. 36 tablet 0   levETIRAcetam (KEPPRA) 750 MG tablet Take 1 tablet (750 mg total) by mouth daily. 90 tablet 4   losartan (COZAAR) 100 MG tablet Take 1 tablet (100 mg total) by mouth every evening. 90 tablet 1   methocarbamol (ROBAXIN) 750 MG tablet Take 1 tablet (750 mg total) by mouth 4 (four) times daily.     metoprolol tartrate (LOPRESSOR) 100 MG tablet Take 1 tablet (100 mg total) by mouth 2 (two) times daily. 180 tablet 1   multivitamin (RENA-VIT) TABS tablet Take 1 tablet by mouth daily.  0   ondansetron (ZOFRAN-ODT) 4 MG disintegrating tablet TAKE 1 TABLET BY MOUTH EVERY 8 HOURS AS NEEDED FOR NAUSEA AND VOMITING 14 tablet 3   oxyCODONE (OXY IR/ROXICODONE) 5 MG immediate release tablet Take 1-2  tablets (5-10 mg total) by mouth every 4 (four) hours as needed for moderate pain. 30 tablet 0   pantoprazole (PROTONIX) 40 MG tablet Take 1 tablet (40 mg total) by mouth 2 (two) times daily before a meal.     pravastatin (PRAVACHOL) 40 MG tablet TAKE 1 TABLET BY MOUTH EVERY DAY IN THE EVENING 90 tablet 2   senna-docusate (SENOKOT-S) 8.6-50 MG tablet Take 1 tablet by mouth 2 (two) times daily.     sevelamer carbonate (RENVELA) 800 MG tablet Take 2 tablets (1,600 mg total) by mouth as needed (snacks).     sevelamer carbonate (RENVELA) 800 MG tablet Take 4  tablets (3,200 mg total) by mouth 3 (three) times daily with meals.     sucralfate (CARAFATE) 1 GM/10ML suspension Take 10 mLs (1 g total) by mouth 4 (four) times daily -  with meals and at bedtime. 420 mL 0   traZODone (DESYREL) 50 MG tablet Take 1 tablet (50 mg total) by mouth at bedtime.     No current facility-administered medications for this visit.    PHYSICAL EXAMINATION: ECOG PERFORMANCE STATUS: 1 - Symptomatic but completely ambulatory  Vitals:   03/06/22 1322  BP: (!) 178/72  Pulse: (!) 56  Resp: 18  Temp: 99.1 F (37.3 C)  SpO2: 100%   Filed Weights   03/06/22 1322  Weight: 201 lb 3.2 oz (91.3 kg)      LABORATORY DATA:  I have reviewed the data as listed    Latest Ref Rng & Units 02/05/2022    3:15 PM 12/01/2021    8:39 AM 11/30/2021    9:16 PM  CMP  Glucose 70 - 99 mg/dL 116  119    BUN 6 - 20 mg/dL 17  36    Creatinine 0.61 - 1.24 mg/dL 7.26  10.55  9.55   Sodium 135 - 145 mmol/L 138  134    Potassium 3.5 - 5.1 mmol/L 3.2  4.4    Chloride 98 - 111 mmol/L 96  97    CO2 22 - 32 mmol/L 31  26    Calcium 8.9 - 10.3 mg/dL 8.5  8.8      Lab Results  Component Value Date   WBC 2.4 (L) 03/06/2022   HGB 10.7 (L) 03/06/2022   HCT 32.5 (L) 03/06/2022   MCV 95.3 03/06/2022   PLT 48 (L) 03/06/2022   NEUTROABS 1.4 (L) 03/06/2022    ASSESSMENT & PLAN:  Pancytopenia ([place]) 03/03/2020: WBC 4.8, hemoglobin 9.8, platelets 84 01/11/2021: WBC 3.6, hemoglobin 11.6, platelets 98, creatinine 7.84 03/21/2021: WBC 2.5, hemoglobin 8.3, MCV 98.5, platelets 73, creatinine 15.97 05/16/2021: WBC 3.1, hemoglobin 11, MCV 100.6, platelets 109, SPEP: No M spike, ANA negative, creatinine 11.65, reticulocyte count absolute 104.8, reticulocyte percentage: 3.1%, erythropoietin 163.8, iron saturation 25%, ferritin 827, folate 7.6, B12 302 08/01/2021: RBC 3, hemoglobin 9.6, platelets 91 08/24/2021: WBC 4.5, hemoglobin 10, platelets 86 03/06/2022: WBC 2.4, hemoglobin 10.7, platelets 48, ANC  1.4, ALC 0.6   Patient has multiple chronic illnesses including 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 Recommended that the patient undergo bone marrow biopsy for further evaluation of the severe cytopenias. Return to clinic 1 week after the bone marrow biopsy to discuss results.   Orders Placed This Encounter  Procedures   CT Biopsy    Standing Status:   Future    Standing Expiration Date:  03/06/2023    Order Specific Question:   Lab orders requested (DO NOT place separate lab orders, these will be automatically ordered during procedure specimen collection):    Answer:   Surgical Pathology    Order Specific Question:   Reason for Exam (SYMPTOM  OR DIAGNOSIS REQUIRED)    Answer:   bone marrow biopsy for pancytopenia    Order Specific Question:   Preferred location?    Answer:   [facility]   The patient has a good understanding of the overall plan. he agrees with it. he will call with any problems that may develop before the next visit here. Total time spent: 30 mins including face to face time and time spent for planning, charting and co-ordination of care   Harriette Ohara, MD 03/06/22    I Gardiner Coins am acting as a Education administrator for [employer]  I have reviewed the above documentation for accuracy and completeness, and I agree with the above.

## 2022-03-07 DIAGNOSIS — Z992 Dependence on renal dialysis: Secondary | ICD-10-CM | POA: Diagnosis not present

## 2022-03-07 DIAGNOSIS — S065XAA Traumatic subdural hemorrhage with loss of consciousness status unknown, initial encounter: Secondary | ICD-10-CM | POA: Diagnosis not present

## 2022-03-07 DIAGNOSIS — N186 End stage renal disease: Secondary | ICD-10-CM | POA: Diagnosis not present

## 2022-03-07 DIAGNOSIS — D509 Iron deficiency anemia, unspecified: Secondary | ICD-10-CM | POA: Diagnosis not present

## 2022-03-07 DIAGNOSIS — N2581 Secondary hyperparathyroidism of renal origin: Secondary | ICD-10-CM | POA: Diagnosis not present

## 2022-03-07 DIAGNOSIS — Z6830 Body mass index (BMI) 30.0-30.9, adult: Secondary | ICD-10-CM | POA: Diagnosis not present

## 2022-03-07 DIAGNOSIS — D631 Anemia in chronic kidney disease: Secondary | ICD-10-CM | POA: Diagnosis not present

## 2022-03-09 DIAGNOSIS — D509 Iron deficiency anemia, unspecified: Secondary | ICD-10-CM | POA: Diagnosis not present

## 2022-03-09 DIAGNOSIS — Z992 Dependence on renal dialysis: Secondary | ICD-10-CM | POA: Diagnosis not present

## 2022-03-09 DIAGNOSIS — N2581 Secondary hyperparathyroidism of renal origin: Secondary | ICD-10-CM | POA: Diagnosis not present

## 2022-03-09 DIAGNOSIS — N186 End stage renal disease: Secondary | ICD-10-CM | POA: Diagnosis not present

## 2022-03-09 DIAGNOSIS — D631 Anemia in chronic kidney disease: Secondary | ICD-10-CM | POA: Diagnosis not present

## 2022-03-12 DIAGNOSIS — D509 Iron deficiency anemia, unspecified: Secondary | ICD-10-CM | POA: Diagnosis not present

## 2022-03-12 DIAGNOSIS — N2581 Secondary hyperparathyroidism of renal origin: Secondary | ICD-10-CM | POA: Diagnosis not present

## 2022-03-12 DIAGNOSIS — N186 End stage renal disease: Secondary | ICD-10-CM | POA: Diagnosis not present

## 2022-03-12 DIAGNOSIS — D631 Anemia in chronic kidney disease: Secondary | ICD-10-CM | POA: Diagnosis not present

## 2022-03-12 DIAGNOSIS — Z992 Dependence on renal dialysis: Secondary | ICD-10-CM | POA: Diagnosis not present

## 2022-03-14 DIAGNOSIS — N186 End stage renal disease: Secondary | ICD-10-CM | POA: Diagnosis not present

## 2022-03-14 DIAGNOSIS — N2581 Secondary hyperparathyroidism of renal origin: Secondary | ICD-10-CM | POA: Diagnosis not present

## 2022-03-14 DIAGNOSIS — D631 Anemia in chronic kidney disease: Secondary | ICD-10-CM | POA: Diagnosis not present

## 2022-03-14 DIAGNOSIS — Z992 Dependence on renal dialysis: Secondary | ICD-10-CM | POA: Diagnosis not present

## 2022-03-14 DIAGNOSIS — D509 Iron deficiency anemia, unspecified: Secondary | ICD-10-CM | POA: Diagnosis not present

## 2022-03-15 ENCOUNTER — Ambulatory Visit: Payer: Medicare Other | Admitting: Family

## 2022-03-15 DIAGNOSIS — M65331 Trigger finger, right middle finger: Secondary | ICD-10-CM | POA: Diagnosis not present

## 2022-03-15 DIAGNOSIS — M65332 Trigger finger, left middle finger: Secondary | ICD-10-CM | POA: Diagnosis not present

## 2022-03-16 DIAGNOSIS — D631 Anemia in chronic kidney disease: Secondary | ICD-10-CM | POA: Diagnosis not present

## 2022-03-16 DIAGNOSIS — N2581 Secondary hyperparathyroidism of renal origin: Secondary | ICD-10-CM | POA: Diagnosis not present

## 2022-03-16 DIAGNOSIS — Z992 Dependence on renal dialysis: Secondary | ICD-10-CM | POA: Diagnosis not present

## 2022-03-16 DIAGNOSIS — D509 Iron deficiency anemia, unspecified: Secondary | ICD-10-CM | POA: Diagnosis not present

## 2022-03-16 DIAGNOSIS — N186 End stage renal disease: Secondary | ICD-10-CM | POA: Diagnosis not present

## 2022-03-19 ENCOUNTER — Telehealth: Payer: Self-pay | Admitting: *Deleted

## 2022-03-19 DIAGNOSIS — D631 Anemia in chronic kidney disease: Secondary | ICD-10-CM | POA: Diagnosis not present

## 2022-03-19 DIAGNOSIS — N186 End stage renal disease: Secondary | ICD-10-CM | POA: Diagnosis not present

## 2022-03-19 DIAGNOSIS — D509 Iron deficiency anemia, unspecified: Secondary | ICD-10-CM | POA: Diagnosis not present

## 2022-03-19 DIAGNOSIS — N2581 Secondary hyperparathyroidism of renal origin: Secondary | ICD-10-CM | POA: Diagnosis not present

## 2022-03-19 DIAGNOSIS — Z992 Dependence on renal dialysis: Secondary | ICD-10-CM | POA: Diagnosis not present

## 2022-03-19 NOTE — Progress Notes (Signed)
  Care Coordination Note  03/19/2022 Name: VIGO TANSKI MRN: [id number] DOB: 10-Jan-1961  [name] is a 62 y.o. year old male who is a primary care patient of Ngetich, [place], NP and is actively engaged with the care management team. I reached out to [name] by phone today to assist with re-scheduling an initial visit with the RN Case Manager  Follow up plan: Unsuccessful telephone outreach attempt made. A HIPAA compliant phone message was left for the patient providing contact information and requesting a return call.   [place]  Direct Dial: [phone]

## 2022-03-20 ENCOUNTER — Ambulatory Visit: Payer: Medicare Other | Admitting: Family

## 2022-03-21 DIAGNOSIS — N186 End stage renal disease: Secondary | ICD-10-CM | POA: Diagnosis not present

## 2022-03-21 DIAGNOSIS — N2581 Secondary hyperparathyroidism of renal origin: Secondary | ICD-10-CM | POA: Diagnosis not present

## 2022-03-21 DIAGNOSIS — Z992 Dependence on renal dialysis: Secondary | ICD-10-CM | POA: Diagnosis not present

## 2022-03-21 DIAGNOSIS — D631 Anemia in chronic kidney disease: Secondary | ICD-10-CM | POA: Diagnosis not present

## 2022-03-21 DIAGNOSIS — D509 Iron deficiency anemia, unspecified: Secondary | ICD-10-CM | POA: Diagnosis not present

## 2022-03-23 DIAGNOSIS — T8612 Kidney transplant failure: Secondary | ICD-10-CM | POA: Diagnosis not present

## 2022-03-23 DIAGNOSIS — D631 Anemia in chronic kidney disease: Secondary | ICD-10-CM | POA: Diagnosis not present

## 2022-03-23 DIAGNOSIS — Z992 Dependence on renal dialysis: Secondary | ICD-10-CM | POA: Diagnosis not present

## 2022-03-23 DIAGNOSIS — N186 End stage renal disease: Secondary | ICD-10-CM | POA: Diagnosis not present

## 2022-03-23 DIAGNOSIS — N2581 Secondary hyperparathyroidism of renal origin: Secondary | ICD-10-CM | POA: Diagnosis not present

## 2022-03-23 DIAGNOSIS — D509 Iron deficiency anemia, unspecified: Secondary | ICD-10-CM | POA: Diagnosis not present

## 2022-03-23 NOTE — Progress Notes (Unsigned)
  Care Coordination  Outreach Note  03/23/2022 Name: Kirk [name] MRN: [id number] DOB: 1960-07-04   Care Coordination Outreach Attempts: A second unsuccessful outreach was attempted today to offer the patient with information about available care coordination services as a benefit of their health plan.     Follow Up Plan:  Additional outreach attempts will be made to offer the patient care coordination information and services.   Encounter Outcome:  No Answer  [place]  Direct Dial: [phone]

## 2022-03-23 NOTE — Progress Notes (Unsigned)
  Care Coordination  Outreach Note  03/23/2022 Name: HOLLY MAUCH MRN: [id number] DOB: 14-Jun-1960   Care Coordination Outreach Attempts: A telephone incoming call was made today patient as for a return call.   Follow Up Plan:  Additional outreach attempts will be made to offer the patient care coordination information and services.   Encounter Outcome:  Pt. Request to Call [place]  Direct Dial: [phone]

## 2022-03-25 ENCOUNTER — Other Ambulatory Visit: Payer: Self-pay | Admitting: Family

## 2022-03-25 DIAGNOSIS — R569 Unspecified convulsions: Secondary | ICD-10-CM

## 2022-03-25 DIAGNOSIS — F339 Major depressive disorder, recurrent, unspecified: Secondary | ICD-10-CM

## 2022-03-25 DIAGNOSIS — F411 Generalized anxiety disorder: Secondary | ICD-10-CM

## 2022-03-26 ENCOUNTER — Other Ambulatory Visit: Payer: Self-pay | Admitting: Internal Medicine

## 2022-03-26 ENCOUNTER — Other Ambulatory Visit: Payer: Self-pay | Admitting: Radiology

## 2022-03-26 DIAGNOSIS — D61818 Other pancytopenia: Secondary | ICD-10-CM

## 2022-03-26 DIAGNOSIS — N186 End stage renal disease: Secondary | ICD-10-CM | POA: Diagnosis not present

## 2022-03-26 DIAGNOSIS — D509 Iron deficiency anemia, unspecified: Secondary | ICD-10-CM | POA: Diagnosis not present

## 2022-03-26 DIAGNOSIS — Z992 Dependence on renal dialysis: Secondary | ICD-10-CM | POA: Diagnosis not present

## 2022-03-26 DIAGNOSIS — N2581 Secondary hyperparathyroidism of renal origin: Secondary | ICD-10-CM | POA: Diagnosis not present

## 2022-03-26 DIAGNOSIS — D631 Anemia in chronic kidney disease: Secondary | ICD-10-CM | POA: Diagnosis not present

## 2022-03-26 NOTE — H&P (Signed)
Referring Physician(s): [id number]  Supervising Physician: Sandi Mariscal  Patient Status:  [facility] OP  Chief Complaint:  "I'm getting a bone marrow biopsy"  Subjective: Patient known to IR service from bone marrow biopsy in 2016 and Onyx embolization of right middle meningeal artery for treatment of chronic subdural hematoma on 11/30/2021.  He presents now with pancytopenia of uncertain etiology and is scheduled today for image guided bone marrow biopsy for further evaluation.  Past medical history also significant for ESRD,  anxiety, asthma, hypertension, GERD, gout, BPH, seizures, sleep apnea, hyperlipidemia, nonalcoholic liver disease, secondary hyperparathyroidism, for neuropathy, vitamin D deficiency.    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)    Anxiety    Asthma    Asthma    moderate persistent   Carpal tunnel syndrome    Cellulitis and abscess of trunk    Cholelithiasis 07/13/2014   Chronic headaches    Debility, unspecified    Dermatophytosis of the body    Dysrhythmia    history of   Edema    End stage renal disease on dialysis [facility])    "MWF; [facility] in [facility]" (10/21/2014)   Essential hypertension, benign    GERD (gastroesophageal reflux disease)    Gout, unspecified    HTN (hypertension)    Hypertrophy of prostate without urinary obstruction and other lower urinary tract symptoms (LUTS)    Hypotension, unspecified    Impotence of organic origin    Insomnia, unspecified    Kidney replaced by transplant    Localization-related (focal) (partial) epilepsy and epileptic syndromes with complex partial seizures, without mention of intractable epilepsy    12-15-19- Wife states he has NEVER had a seizure    Lumbago    Memory loss    OSA on CPAP    Other and unspecified hyperlipidemia    controlled  /managed per wife    Other chronic nonalcoholic liver disease    Other malaise and fatigue    Other nonspecific abnormal serum enzyme levels    Pain in joint, lower leg    Pain in joint, upper arm    Pneumonia "several times"   PONV (postoperative nausea and vomiting)    Renal dialysis status(V45.11) 02/05/2010   restarted 01/02/13 ofter renal trransplant failure   Secondary hyperparathyroidism (of renal origin)    Shortness of breath    Sleep apnea    wears cpap    Tension headache    Unspecified constipation    Unspecified essential hypertension    Unspecified hereditary and idiopathic peripheral neuropathy    Unspecified vitamin D deficiency    Past Surgical History:  Procedure Laterality Date   AV FISTULA PLACEMENT Left ?2010   "forearm; at [place]"   [place]  03/21/2011   CHOLECYSTECTOMY N/A 10/21/2014   Procedure: LAPAROSCOPIC CHOLECYSTECTOMY WITH INTRAOPERATIVE CHOLANGIOGRAM;  Surgeon: Autumn Messing III, MD;  Location: [place];  Service: General;  Laterality: N/A;   COLONOSCOPY     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   IR ANGIO INTRA EXTRACRAN SEL INTERNAL CAROTID UNI R MOD SED  11/30/2021   IR ANGIOGRAM FOLLOW UP STUDY  11/30/2021   IR NEURO EACH ADD'L AFTER BASIC UNI RIGHT (MS)  11/30/2021   IR TRANSCATH/EMBOLIZ  11/30/2021  KIDNEY TRANSPLANT  08/17/2011   [facility]    LAPAROSCOPIC CHOLECYSTECTOMY  10/21/2014   w/IOC   LEFT HEART CATHETERIZATION WITH CORONARY ANGIOGRAM N/A 03/21/2011   Procedure: LEFT HEART CATHETERIZATION WITH CORONARY ANGIOGRAM;  Surgeon: [name], MD;  Location: [facility] CATH LAB;  Service: Cardiovascular;  Laterality: N/A;   NEPHRECTOMY  08/2013   removed transplaned kidney   POLYPECTOMY     POSTERIOR FUSION CERVICAL SPINE  06/25/2012   for spinal stenosis   RADIOLOGY WITH ANESTHESIA N/A 11/30/2021   Procedure: RIGHT MIDDLE MENINGEAL ARTERY EMBOLIZATION;  Surgeon: Radiologist, Medication,  MD;  Location: [place];  Service: Radiology;  Laterality: N/A;   VASECTOMY  2010      Allergies: Codeine and Hydrocodone-acetaminophen  Medications: Prior to Admission medications   Medication Sig Start Date End Date Taking? Authorizing Provider  acetaminophen (TYLENOL) 500 MG tablet Take 1 tablet (500 mg total) by mouth every 6 (six) hours as needed for moderate pain. 09/12/14   Samuella Cota, MD  amLODipine (NORVASC) 10 MG tablet Take 1 tablet (10 mg total) by mouth every evening. 11/29/21   Angiulli, Lavon Paganini, PA-C  calcitRIOL (ROCALTROL) 0.5 MCG capsule Take 2 capsules (1 mcg total) by mouth every Monday, Wednesday, and Friday with hemodialysis. 06/04/18   Black, Lezlie Octave, NP  Cholecalciferol (VITAMIN D3) 25 MCG (1000 UT) CAPS Take 4,000 Units by mouth daily.    [provider]  cinacalcet (SENSIPAR) 30 MG tablet Take 2 tablets (60 mg total) by mouth 3 (three) times a week. 11/29/21   Angiulli, Lavon Paganini, PA-C  cloNIDine (CATAPRES) 0.2 MG tablet Take 1 tablet (0.2 mg total) by mouth every 4 (four) hours as needed (Sbp >170 or dbp>100). 11/29/21   Angiulli, Lavon Paganini, PA-C  doxazosin (CARDURA) 4 MG tablet TAKE 1 TABLET BY MOUTH EVERY DAY 01/09/22   Ngetich, Dinah C, NP  DULoxetine (CYMBALTA) 30 MG capsule Take 1 capsule (30 mg total) by mouth daily. 02/28/22   Ngetich, Dinah C, NP  FOSRENOL 1000 MG PACK Take 1 packet by mouth 3 (three) times daily. 02/26/22   [provider]  guaiFENesin (MUCINEX) 600 MG 12 hr tablet Take 1,200 mg by mouth 2 (two) times daily.    [provider]  hydrALAZINE (APRESOLINE) 100 MG tablet Take 1 tablet (100 mg total) by mouth every 8 (eight) hours. 11/29/21   Angiulli, Lavon Paganini, PA-C  isosorbide mononitrate (IMDUR) 60 MG 24 hr tablet Take 1 tablet (60 mg total) by mouth daily. 11/29/21   Angiulli, Lavon Paganini, PA-C  levETIRAcetam (KEPPRA) 250 MG tablet Take 1 tablet (250 mg total) by mouth every Monday, Wednesday, and Friday with hemodialysis.  02/28/22 05/29/22  Ngetich, Dinah C, NP  levETIRAcetam (KEPPRA) 750 MG tablet Take 1 tablet (750 mg total) by mouth daily. 02/20/22 05/16/23  Alric Ran, MD  losartan (COZAAR) 100 MG tablet Take 1 tablet (100 mg total) by mouth every evening. 12/05/21   Ngetich, Dinah C, NP  methocarbamol (ROBAXIN) 750 MG tablet Take 1 tablet (750 mg total) by mouth 4 (four) times daily. 11/29/21   Angiulli, Lavon Paganini, PA-C  metoprolol tartrate (LOPRESSOR) 100 MG tablet Take 1 tablet (100 mg total) by mouth 2 (two) times daily. 01/18/22   Ngetich, Dinah C, NP  multivitamin (RENA-VIT) TABS tablet Take 1 tablet by mouth daily. 11/29/21   Angiulli, Lavon Paganini, PA-C  ondansetron (ZOFRAN-ODT) 4 MG disintegrating tablet TAKE 1 TABLET BY MOUTH EVERY 8 HOURS AS NEEDED FOR NAUSEA AND  VOMITING 02/28/22   Ngetich, Dinah C, NP  oxyCODONE (OXY IR/ROXICODONE) 5 MG immediate release tablet Take 1-2 tablets (5-10 mg total) by mouth every 4 (four) hours as needed for moderate pain. 01/18/22   Ngetich, Dinah C, NP  pantoprazole (PROTONIX) 40 MG tablet Take 1 tablet (40 mg total) by mouth 2 (two) times daily before a meal. 11/29/21   Angiulli, Lavon Paganini, PA-C  pravastatin (PRAVACHOL) 40 MG tablet TAKE 1 TABLET BY MOUTH EVERY DAY IN THE EVENING 10/19/21   Ngetich, Dinah C, NP  senna-docusate (SENOKOT-S) 8.6-50 MG tablet Take 1 tablet by mouth 2 (two) times daily. 11/29/21   Angiulli, Lavon Paganini, PA-C  sevelamer carbonate (RENVELA) 800 MG tablet Take 2 tablets (1,600 mg total) by mouth as needed (snacks). 11/29/21   Angiulli, Lavon Paganini, PA-C  sevelamer carbonate (RENVELA) 800 MG tablet Take 4 tablets (3,200 mg total) by mouth 3 (three) times daily with meals. 11/29/21   Angiulli, Lavon Paganini, PA-C  sucralfate (CARAFATE) 1 GM/10ML suspension Take 10 mLs (1 g total) by mouth 4 (four) times daily -  with meals and at bedtime. 11/29/21   Angiulli, Lavon Paganini, PA-C  traZODone (DESYREL) 50 MG tablet Take 1 tablet (50 mg total) by mouth at bedtime. 11/29/21   Angiulli,  Lavon Paganini, PA-C     Vital Signs:    Code Status:    Physical Exam  Imaging: No results found.  Labs:  CBC: Recent Labs    11/30/21 2116 12/01/21 0839 12/01/21 1439 02/05/22 1515 03/06/22 1254  WBC 3.5* 3.8*  --  2.6* 2.4*  HGB 7.2* 6.7* 8.2* 11.1* 10.7*  HCT 22.4* 20.5* 24.8* 33.8* 32.5*  PLT 73* 74*  --  83* 48*    COAGS: Recent Labs    11/15/21 0302  INR 1.2  APTT 29    BMP: Recent Labs    11/24/21 1610 11/30/21 1154 11/30/21 2116 12/01/21 0839 02/05/22 1515 03/06/22 1254  NA 130* 133*  --  134* 138 141  K 5.0 4.8  --  4.4 3.2* 4.1  CL 93* 94*  --  97* 96* 98  CO2 26  --   --  26 31 35*  GLUCOSE 103* 94  --  119* 116* 99  BUN 39* 27*  --  36* 17 39*  CALCIUM 8.9  --   --  8.8* 8.5* 8.5*  CREATININE 11.57* 9.10* 9.55* 10.55* 7.26* 9.46*  GFRNONAA 5*  --  6* 5* 8* 6*    LIVER FUNCTION TESTS: Recent Labs    05/16/21 1611 11/07/21 2144 11/10/21 1246 11/15/21 0302 11/16/21 0834 11/19/21 0326 11/24/21 1610 12/01/21 0839 03/06/22 1254  BILITOT 0.4 0.7  --  1.6*  --   --   --   --  0.5  AST 11* 11*  --  15  --   --   --   --  10*  ALT 9 11  --  11  --   --   --   --  10  ALKPHOS 57 55  --  63  --   --   --   --  75  PROT 7.1 6.5  --  6.5  --   --   --   --  6.3*  ALBUMIN 4.1 3.2*   < > 3.2*   < > 3.1* 3.1* 3.1* 3.9   < > = values in this interval not displayed.    Assessment and Plan: Patient known to IR service from bone  marrow biopsy in 2016 and Onyx embolization of right middle meningeal artery for treatment of chronic subdural hematoma on 11/30/2021.  He presents now with pancytopenia of uncertain etiology and is scheduled today for image guided bone marrow biopsy for further evaluation.  Past medical history also significant for ESRD,  anxiety, asthma, hypertension, GERD, gout, BPH, seizures, sleep apnea, hyperlipidemia, nonalcoholic liver disease, secondary hyperparathyroidism, for neuropathy, vitamin D deficiency.Risks and benefits of  procedure was discussed with the patient and/or patient's family including, but not limited to bleeding, infection, damage to adjacent structures or low yield requiring additional tests.  All of the questions were answered and there is agreement to proceed.  Consent signed and in chart.    Electronically Signed: D. Rowe Robert, PA-C 03/26/2022, 10:50 AM   I spent a total of 20 minutes at the the patient's bedside AND on the patient's hospital floor or unit, greater than 50% of which was counseling/coordinating care for image guided bone marrow biopsy

## 2022-03-26 NOTE — Telephone Encounter (Signed)
High risk or very high risk warning populated when attempting to refill both medications. RX request sent to PCP for review and approval if warranted.

## 2022-03-26 NOTE — Progress Notes (Signed)
  Care Coordination Note  03/26/2022 Name: [name] MRN: [id number] DOB: 1960-11-04  Kirk [name] is a 62 y.o. year old male who is a primary care patient of Ngetich, [place], NP and is actively engaged with the care management team. I reached out to Kirk [name] by phone today to assist with re-scheduling an initial visit with the RN Case Manager  Follow up plan: Telephone appointment with care management team member scheduled for:04/03/22  [place]: [phone]

## 2022-03-27 ENCOUNTER — Ambulatory Visit (HOSPITAL_COMMUNITY)
Admission: RE | Admit: 2022-03-27 | Discharge: 2022-03-27 | Disposition: A | Discharge status: Home / Self Care | Payer: Medicare Other | Source: Ambulatory Visit | Attending: Hematology and Oncology | Admitting: Hematology and Oncology

## 2022-03-27 ENCOUNTER — Encounter (HOSPITAL_COMMUNITY): Payer: Self-pay

## 2022-03-27 DIAGNOSIS — E559 Vitamin D deficiency, unspecified: Secondary | ICD-10-CM | POA: Insufficient documentation

## 2022-03-27 DIAGNOSIS — R569 Unspecified convulsions: Secondary | ICD-10-CM | POA: Insufficient documentation

## 2022-03-27 DIAGNOSIS — F419 Anxiety disorder, unspecified: Secondary | ICD-10-CM | POA: Diagnosis not present

## 2022-03-27 DIAGNOSIS — E785 Hyperlipidemia, unspecified: Secondary | ICD-10-CM | POA: Insufficient documentation

## 2022-03-27 DIAGNOSIS — D61818 Other pancytopenia: Secondary | ICD-10-CM | POA: Diagnosis not present

## 2022-03-27 DIAGNOSIS — Z992 Dependence on renal dialysis: Secondary | ICD-10-CM | POA: Insufficient documentation

## 2022-03-27 DIAGNOSIS — N186 End stage renal disease: Secondary | ICD-10-CM | POA: Insufficient documentation

## 2022-03-27 DIAGNOSIS — K219 Gastro-esophageal reflux disease without esophagitis: Secondary | ICD-10-CM | POA: Insufficient documentation

## 2022-03-27 DIAGNOSIS — D696 Thrombocytopenia, unspecified: Secondary | ICD-10-CM | POA: Insufficient documentation

## 2022-03-27 DIAGNOSIS — N2581 Secondary hyperparathyroidism of renal origin: Secondary | ICD-10-CM | POA: Insufficient documentation

## 2022-03-27 DIAGNOSIS — G4733 Obstructive sleep apnea (adult) (pediatric): Secondary | ICD-10-CM | POA: Diagnosis not present

## 2022-03-27 DIAGNOSIS — J45909 Unspecified asthma, uncomplicated: Secondary | ICD-10-CM | POA: Insufficient documentation

## 2022-03-27 DIAGNOSIS — D649 Anemia, unspecified: Secondary | ICD-10-CM | POA: Diagnosis not present

## 2022-03-27 DIAGNOSIS — K7460 Unspecified cirrhosis of liver: Secondary | ICD-10-CM | POA: Diagnosis not present

## 2022-03-27 DIAGNOSIS — I120 Hypertensive chronic kidney disease with stage 5 chronic kidney disease or end stage renal disease: Secondary | ICD-10-CM | POA: Diagnosis not present

## 2022-03-27 HISTORY — PX: IR BONE MARROW BIOPSY & ASPIRATION: IMG5727

## 2022-03-27 LAB — CBC WITH DIFFERENTIAL/PLATELET
Abs Immature Granulocytes: 0.01 10*3/uL (ref 0.00–0.07)
Basophils Absolute: 0 10*3/uL (ref 0.0–0.1)
Basophils Relative: 0 %
Eosinophils Absolute: 0.1 10*3/uL (ref 0.0–0.5)
Eosinophils Relative: 1 %
HCT: 32.4 % — ABNORMAL LOW (ref 39.0–52.0)
Hemoglobin: 10.4 g/dL — ABNORMAL LOW (ref 13.0–17.0)
Immature Granulocytes: 0 %
Lymphocytes Relative: 6 %
Lymphs Abs: 0.4 10*3/uL — ABNORMAL LOW (ref 0.7–4.0)
MCH: 30.8 pg (ref 26.0–34.0)
MCHC: 32.1 g/dL (ref 30.0–36.0)
MCV: 95.9 fL (ref 80.0–100.0)
Monocytes Absolute: 0.6 10*3/uL (ref 0.1–1.0)
Monocytes Relative: 8 %
Neutro Abs: 5.9 10*3/uL (ref 1.7–7.7)
Neutrophils Relative %: 85 %
Platelets: 67 10*3/uL — ABNORMAL LOW (ref 150–400)
RBC: 3.38 MIL/uL — ABNORMAL LOW (ref 4.22–5.81)
RDW: 14.9 % (ref 11.5–15.5)
WBC: 7 10*3/uL (ref 4.0–10.5)
nRBC: 0 % (ref 0.0–0.2)

## 2022-03-27 MED ORDER — DIPHENHYDRAMINE HCL 50 MG/ML IJ SOLN
INTRAMUSCULAR | Status: AC
  Filled 2022-03-27: qty 1

## 2022-03-27 MED ORDER — SODIUM CHLORIDE 0.9 % IV SOLN: INTRAVENOUS | Status: DC

## 2022-03-27 MED ORDER — MIDAZOLAM HCL 2 MG/2ML IJ SOLN
INTRAMUSCULAR | Status: AC
  Filled 2022-03-27: qty 2

## 2022-03-27 MED ORDER — FENTANYL CITRATE (PF) 100 MCG/2ML IJ SOLN
INTRAMUSCULAR | Status: AC | PRN
  Administered 2022-03-27 (×2): 50 ug via INTRAVENOUS

## 2022-03-27 MED ORDER — LIDOCAINE HCL (PF) 1 % IJ SOLN
INTRAMUSCULAR | Status: AC
  Administered 2022-03-27: 10 mL
  Filled 2022-03-27: qty 30

## 2022-03-27 MED ORDER — FENTANYL CITRATE (PF) 100 MCG/2ML IJ SOLN
INTRAMUSCULAR | Status: AC
  Filled 2022-03-27: qty 2

## 2022-03-27 MED ORDER — MIDAZOLAM HCL 2 MG/2ML IJ SOLN
INTRAMUSCULAR | Status: AC | PRN
  Administered 2022-03-27 (×2): 1 mg via INTRAVENOUS

## 2022-03-27 NOTE — Discharge Instructions (Signed)
Bone Marrow Aspiration and Bone Marrow Biopsy, Adult, Care After  The following information offers guidance on how to care for yourself after your procedure. Your health care provider may also give you more specific instructions. If you have problems or questions, contact your health care provider.  What can I expect after the procedure?  May remove dressing or bandaid and shower tomorrow.  Keep site clean and dry. Replace with clean dressing or bandaid as necessary. Urgent needs IR clinic [phone] (mon-fri 8-5).  After the procedure, it is common to have: Mild pain and tenderness. Swelling. Bruising. Follow these instructions at home: Incision care  Follow instructions from your health care provider about how to take care of the incision site. Make sure you: Wash your hands with soap and water for at least 20 seconds before and after you change your bandage (dressing). If soap and water are not available, use hand sanitizer. Change your dressing as told by your health care provider. Leave stitches (sutures), skin glue, or adhesive strips in place. These skin closures may need to stay in place for 2 weeks or longer. If adhesive strip edges start to loosen and curl up, you may trim the loose edges. Do not remove adhesive strips completely unless your health care provider tells you to do that. Check your incision site every day for signs of infection. Check for: More redness, swelling, or pain. Fluid or blood. Warmth. Pus or a bad smell. Activity Return to your normal activities as told by your health care provider. Ask your health care provider what activities are safe for you. Do not lift anything that is heavier than 10 lb (4.5 kg), or the limit that you are told, until your health care provider says that it is safe. If you were given a sedative during the procedure, it can affect you for  several hours. Do not drive or operate machinery until your health care provider says that it is safe. General instructions  Take over-the-counter and prescription medicines only as told by your health care provider. Do not take baths, swim, or use a hot tub until your health care provider approves. Ask your health care provider if you may take showers. You may only be allowed to take sponge baths. If directed, put ice on the affected area. To do this: Put ice in a plastic bag. Place a towel between your skin and the bag. Leave the ice on for 20 minutes, 2-3 times a day. If your skin turns bright red, remove the ice right away to prevent skin damage. The risk of skin damage is higher if you cannot feel pain, heat, or cold. Contact a health care provider if: You have signs of infection. Your pain is not controlled with medicine. You have cancer, and a temperature of 100.49F (38C) or higher. Get help right away if: You have a temperature of 101F (38.3C) or higher, or as told by your health care provider. You have bleeding from the incision site that cannot be controlled. This information is not intended to replace advice given to you by your health care provider. Make sure you discuss any questions you have with your health care provider. Document Revised: 05/15/2021 Document Reviewed: 05/15/2021 Elsevier Patient Education  [place].                            Moderate Conscious Sedation, Adult, Care After  This sheet gives you information about how to care  for yourself after your procedure. Your health care provider may also give you more specific instructions. If you have problems or questions, contact your health care provider. What can I expect after the procedure? After the procedure, it is common to have: Sleepiness for several hours. Impaired judgment for several hours. Difficulty with balance. Vomiting if you eat too soon. Follow these instructions at home: For  the time period you were told by your health care provider:     Rest. Do not participate in activities where you could fall or become injured. Do not drive or use machinery. Do not drink alcohol. Do not take sleeping pills or medicines that cause drowsiness. Do not make important decisions or sign legal documents. Do not take care of children on your own. Eating and drinking  Follow the diet recommended by your health care provider. Drink enough fluid to keep your urine pale yellow. If you vomit: Drink water, juice, or soup when you can drink without vomiting. Make sure you have little or no nausea before eating solid foods. General instructions Take over-the-counter and prescription medicines only as told by your health care provider. Have a responsible adult stay with you for the time you are told. It is important to have someone help care for you until you are awake and alert. Do not smoke. Keep all follow-up visits as told by your health care provider. This is important. Contact a health care provider if: You are still sleepy or having trouble with balance after 24 hours. You feel light-headed. You keep feeling nauseous or you keep vomiting. You develop a rash. You have a fever. You have redness or swelling around the IV site. Get help right away if: You have trouble breathing. You have new-onset confusion at home. Summary After the procedure, it is common to feel sleepy, have impaired judgment, or feel nauseous if you eat too soon. Rest after you get home. Know the things you should not do after the procedure. Follow the diet recommended by your health care provider and drink enough fluid to keep your urine pale yellow. Get help right away if you have trouble breathing or new-onset confusion at home. This information is not intended to replace advice given to you by your health care provider. Make sure you discuss any questions you have with your health care  provider. Document Revised: 05/08/2019 Document Reviewed: 12/04/2018 Elsevier Patient Education  [place].

## 2022-03-27 NOTE — Procedures (Signed)
Pre-procedure Diagnosis: Pancytopenia of uncertain etiology  Post-procedure Diagnosis: Same  Technically successful CT guided bone marrow aspiration and biopsy of left iliac crest.   Complications: None Immediate  EBL: None  Signed: Sandi Mariscal Pager: [phone] 03/27/2022, 9:10 AM

## 2022-03-28 DIAGNOSIS — D631 Anemia in chronic kidney disease: Secondary | ICD-10-CM | POA: Diagnosis not present

## 2022-03-28 DIAGNOSIS — D509 Iron deficiency anemia, unspecified: Secondary | ICD-10-CM | POA: Diagnosis not present

## 2022-03-28 DIAGNOSIS — N186 End stage renal disease: Secondary | ICD-10-CM | POA: Diagnosis not present

## 2022-03-28 DIAGNOSIS — Z992 Dependence on renal dialysis: Secondary | ICD-10-CM | POA: Diagnosis not present

## 2022-03-28 DIAGNOSIS — N2581 Secondary hyperparathyroidism of renal origin: Secondary | ICD-10-CM | POA: Diagnosis not present

## 2022-03-29 ENCOUNTER — Ambulatory Visit: Payer: Medicare Other | Admitting: Family

## 2022-03-29 ENCOUNTER — Ambulatory Visit (INDEPENDENT_AMBULATORY_CARE_PROVIDER_SITE_OTHER): Payer: Medicare Other | Admitting: Family

## 2022-03-29 ENCOUNTER — Encounter: Payer: Self-pay | Admitting: Family

## 2022-03-29 VITALS — BP 170/56 | HR 58 | Temp 98.4°F | Resp 18 | Ht 67.0 in | Wt 194.8 lb

## 2022-03-29 DIAGNOSIS — N186 End stage renal disease: Secondary | ICD-10-CM

## 2022-03-29 DIAGNOSIS — I120 Hypertensive chronic kidney disease with stage 5 chronic kidney disease or end stage renal disease: Secondary | ICD-10-CM | POA: Diagnosis not present

## 2022-03-29 MED ORDER — ISOSORBIDE MONONITRATE ER 60 MG PO TB24: 60.0000 mg | ORAL_TABLET | Freq: Every day | ORAL | 3 refills | Status: DC

## 2022-03-29 MED ORDER — CLONIDINE HCL 0.2 MG PO TABS: 0.2000 mg | ORAL_TABLET | ORAL | 11 refills | Status: DC | PRN

## 2022-03-29 MED ORDER — HYDRALAZINE HCL 100 MG PO TABS: 100.0000 mg | ORAL_TABLET | Freq: Three times a day (TID) | ORAL | 3 refills | Status: DC

## 2022-03-29 NOTE — Progress Notes (Signed)
Provider: Marlowe Sax FNP-C  Sereen Schaff, Nelda Bucks, NP  Patient Care Team: Aarion Metzgar, Nelda Bucks, NP as PCP - General (Family Medicine) [name], MD as PCP - Cardiology (Cardiology) Jovita Kussmaul, MD as Consulting Physician (General Surgery) [name], MD as Consulting Physician (Pulmonary Disease) Estanislado Emms, MD (Inactive) as Consulting Physician (Nephrology) Elam Dutch, MD (Inactive) as Consulting Physician (Vascular Surgery) [name], MD as Consulting Physician (Cardiology) Milus Banister, MD as Attending Physician (Gastroenterology) Jamal Maes, MD as Consulting Physician (Nephrology)  Extended Emergency Contact Information Primary Emergency Contact: [name] Address: [address]          [place], [place] [address] Johnnette Litter of [place] Phone: [phone] Mobile Phone: [phone] Relation: Spouse  Code Status:  DNR Goals of care: Advanced Directive information    03/27/2022    7:10 AM  Advanced Directives  Does Patient Have a Medical Advance Directive? No  Would patient like information on creating a medical advance directive? No - Patient declined     Chief Complaint  Patient presents with   Hypertension    Pt c/o of elevated BP, SOB    HPI:  Pt is a 62 y.o. male seen today for an acute visit for evaluation of high blood pressure and shortness of breath. States readings at home in the 140's -180's.Has chronic headaches. Request refill on B/p medication. - Advised to check Blood pressure at home and record on log provided and notify provider if B/p > 140/90. He denies any dizziness,vision changes,fatigue,chest tightness,palpitation or chest pain.  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)    Anxiety    Asthma    Asthma    moderate persistent   Carpal tunnel  syndrome    Cellulitis and abscess of trunk    Cholelithiasis 07/13/2014   Chronic headaches    Debility, unspecified    Dermatophytosis of the body    Dysrhythmia    history of   Edema    End stage renal disease on dialysis [facility])    "MWF; [facility]" (10/21/2014)   Essential hypertension, benign    GERD (gastroesophageal reflux disease)    Gout, unspecified    HTN (hypertension)    Hypertrophy of prostate without urinary obstruction and other lower urinary tract symptoms (LUTS)    Hypotension, unspecified    Impotence of organic origin    Insomnia, unspecified    Kidney replaced by transplant    Localization-related (focal) (partial) epilepsy and epileptic syndromes with complex partial seizures, without mention of intractable epilepsy    12-15-19- Wife states he has NEVER had a seizure    Lumbago    Memory loss    OSA on CPAP    Other and unspecified hyperlipidemia    controlled /managed per wife    Other chronic nonalcoholic liver disease    Other malaise and fatigue    Other nonspecific abnormal serum enzyme levels    Pain in joint, lower leg    Pain in joint, upper arm    Pneumonia "several times"   PONV (postoperative nausea and vomiting)    Renal dialysis status(V45.11) 02/05/2010   restarted 01/02/13 ofter renal trransplant failure   Secondary hyperparathyroidism (of renal origin)    Shortness of breath    Sleep apnea  wears cpap    Tension headache    Unspecified constipation    Unspecified essential hypertension    Unspecified hereditary and idiopathic peripheral neuropathy    Unspecified vitamin D deficiency    Past Surgical History:  Procedure Laterality Date   AV FISTULA PLACEMENT Left ?2010   "forearm; at [facility]"   [place]  03/21/2011   CHOLECYSTECTOMY N/A 10/21/2014   Procedure: LAPAROSCOPIC CHOLECYSTECTOMY WITH INTRAOPERATIVE CHOLANGIOGRAM;  Surgeon: Autumn Messing III, MD;  Location:  [facility];  Service: General;  Laterality: N/A;   COLONOSCOPY     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   IR ANGIO INTRA EXTRACRAN SEL INTERNAL CAROTID UNI R MOD SED  11/30/2021   IR ANGIOGRAM FOLLOW UP STUDY  11/30/2021   IR BONE MARROW BIOPSY & ASPIRATION  03/27/2022   IR NEURO EACH ADD'L AFTER BASIC UNI RIGHT (MS)  11/30/2021   IR TRANSCATH/EMBOLIZ  11/30/2021   KIDNEY TRANSPLANT  08/17/2011   [facility]    LAPAROSCOPIC CHOLECYSTECTOMY  10/21/2014   w/IOC   LEFT HEART CATHETERIZATION WITH CORONARY ANGIOGRAM N/A 03/21/2011   Procedure: LEFT HEART CATHETERIZATION WITH CORONARY ANGIOGRAM;  Surgeon: [name], MD;  Location: [facility] CATH LAB;  Service: Cardiovascular;  Laterality: N/A;   NEPHRECTOMY  08/2013   removed transplaned kidney   POLYPECTOMY     POSTERIOR FUSION CERVICAL SPINE  06/25/2012   for spinal stenosis   RADIOLOGY WITH ANESTHESIA N/A 11/30/2021   Procedure: RIGHT MIDDLE MENINGEAL ARTERY EMBOLIZATION;  Surgeon: Radiologist, Medication, MD;  Location: [facility];  Service: Radiology;  Laterality: N/A;   VASECTOMY  2010    Allergies  Allergen Reactions   Codeine Nausea And Vomiting   Hydrocodone-Acetaminophen Nausea And Vomiting    Outpatient Encounter Medications as of 03/29/2022  Medication Sig   acetaminophen (TYLENOL) 500 MG tablet Take 1 tablet (500 mg total) by mouth every 6 (six) hours as needed for moderate pain.   amLODipine (NORVASC) 10 MG tablet Take 1 tablet (10 mg total) by mouth every evening.   aspirin 81 MG chewable tablet Chew 81 mg by mouth daily.   budesonide-formoterol (SYMBICORT) 160-4.5 MCG/ACT inhaler Inhale 2 puffs into the lungs as needed.   calcitRIOL (ROCALTROL) 0.5 MCG capsule Take 2 capsules (1 mcg total) by mouth every Monday, Wednesday, and Friday with hemodialysis.   Cholecalciferol (VITAMIN D3) 25 MCG (1000 UT) CAPS Take 4,000 Units by mouth daily.   cinacalcet (SENSIPAR) 30 MG tablet Take 2 tablets (60 mg total) by mouth 3 (three) times a  week.   cloNIDine (CATAPRES) 0.2 MG tablet Take 1 tablet (0.2 mg total) by mouth every 4 (four) hours as needed (Sbp >170 or dbp>100).   diphenhydrAMINE (BENADRYL) 25 mg capsule Take 25 mg by mouth every 6 (six) hours as needed for allergies.   doxazosin (CARDURA) 4 MG tablet TAKE 1 TABLET BY MOUTH EVERY DAY   DULoxetine (CYMBALTA) 30 MG capsule TAKE 1 CAPSULE BY MOUTH EVERY DAY   FOSRENOL 1000 MG PACK Take 1 packet by mouth 3 (three) times daily.   guaiFENesin (MUCINEX) 600 MG 12 hr tablet Take 1,200 mg by mouth 2 (two) times daily.   hydrALAZINE (APRESOLINE) 100 MG tablet Take 1 tablet (100 mg total) by mouth every 8 (eight) hours.   isosorbide mononitrate (IMDUR) 60 MG 24 hr tablet Take 1 tablet (60 mg total) by mouth daily.   levETIRAcetam (KEPPRA) 250 MG tablet TAKE 1  TABLET (250 MG TOTAL) BY MOUTH EVERY MONDAY, WEDNESDAY, AND FRIDAY WITH HEMODIALYSIS.   levETIRAcetam (KEPPRA) 750 MG tablet Take 1 tablet (750 mg total) by mouth daily.   losartan (COZAAR) 100 MG tablet Take 1 tablet (100 mg total) by mouth every evening.   methocarbamol (ROBAXIN) 750 MG tablet Take 1 tablet (750 mg total) by mouth 4 (four) times daily.   metoprolol tartrate (LOPRESSOR) 100 MG tablet Take 1 tablet (100 mg total) by mouth 2 (two) times daily.   multivitamin (RENA-VIT) TABS tablet Take 1 tablet by mouth daily.   ondansetron (ZOFRAN-ODT) 4 MG disintegrating tablet TAKE 1 TABLET BY MOUTH EVERY 8 HOURS AS NEEDED FOR NAUSEA AND VOMITING   oxyCODONE (OXY IR/ROXICODONE) 5 MG immediate release tablet Take 1-2 tablets (5-10 mg total) by mouth every 4 (four) hours as needed for moderate pain.   pantoprazole (PROTONIX) 40 MG tablet Take 1 tablet (40 mg total) by mouth 2 (two) times daily before a meal.   pravastatin (PRAVACHOL) 40 MG tablet TAKE 1 TABLET BY MOUTH EVERY DAY IN THE EVENING   senna-docusate (SENOKOT-S) 8.6-50 MG tablet Take 1 tablet by mouth 2 (two) times daily.   sevelamer carbonate (RENVELA) 800 MG tablet  Take 2 tablets (1,600 mg total) by mouth as needed (snacks).   sevelamer carbonate (RENVELA) 800 MG tablet Take 4 tablets (3,200 mg total) by mouth 3 (three) times daily with meals.   sucralfate (CARAFATE) 1 GM/10ML suspension Take 10 mLs (1 g total) by mouth 4 (four) times daily -  with meals and at bedtime.   traZODone (DESYREL) 50 MG tablet Take 1 tablet (50 mg total) by mouth at bedtime.   No facility-administered encounter medications on file as of 03/29/2022.    Review of Systems  Constitutional:  Negative for appetite change, chills, fatigue, fever and unexpected weight change.  Eyes:  Negative for pain, discharge, redness, itching and visual disturbance.  Respiratory:  Negative for cough, chest tightness, shortness of breath and wheezing.   Cardiovascular:  Negative for chest pain, palpitations and leg swelling.  Gastrointestinal:  Negative for abdominal distention, abdominal pain, constipation, diarrhea, nausea and vomiting.  Genitourinary:        On Hemodialysis three times per week   Musculoskeletal:  Positive for arthralgias, back pain and gait problem. Negative for joint swelling, myalgias, neck pain and neck stiffness.  Skin:  Negative for color change, pallor, rash and wound.  Neurological:  Negative for dizziness, weakness, light-headedness, numbness and headaches.       Chronic headache   Hematological:  Does not bruise/bleed easily.  Psychiatric/Behavioral:  Negative for agitation, behavioral problems, confusion, hallucinations and sleep disturbance. The patient is not nervous/anxious.     Immunization History  Administered Date(s) Administered   Influenza Whole 11/01/2010   Influenza, Seasonal, Injecte, Preservative Fre 10/29/2012   Influenza-Unspecified 11/21/2011, 11/05/2012, 09/22/2013, 09/22/2017, 11/23/2019, 10/28/2020, 12/04/2020   Moderna Sars-Covid-2 Vaccination 04/06/2019, 05/07/2019, 03/31/2020   PPD Test 12/26/2012   Pneumococcal Polysaccharide-23  11/01/2010, 10/29/2012   Pneumococcal-Unspecified 11/05/2012   Tdap 01/22/2006, 06/08/2021   Pertinent  Health Maintenance Due  Topic Date Due   INFLUENZA VACCINE  08/22/2021   HEMOGLOBIN A1C  11/25/2021   COLONOSCOPY (Pts 45-37yr Insurance coverage will need to be confirmed)  12/29/2026   FOOT EXAM  Discontinued   OPHTHALMOLOGY EXAM  Discontinued      11/30/2021   10:00 AM 11/30/2021    8:36 PM 12/01/2021    8:00 AM 12/07/2021   11:07 AM 02/28/2022  3:52 PM  [place] in the past year?    0 1  Was there an injury with Fall?    0 1  Fall Risk Category Calculator    0 3  Fall Risk Category (Retired)    Low   (RETIRED) Patient Fall Risk Level High fall risk High fall risk High fall risk Low fall risk   Patient at Risk for Falls Due to    No Fall Risks No Fall Risks  Fall risk Follow up    Falls evaluation completed Falls evaluation completed   Functional Status Survey:    Vitals:   03/29/22 1131  BP: (!) 170/56  Pulse: (!) 58  Resp: 18  Temp: 98.4 F (36.9 C)  SpO2: 95%  Weight: 194 lb 12.8 oz (88.4 kg)  Height: '5\' 7"'$  (1.702 m)   Body mass index is 30.51 kg/m. Physical Exam Vitals reviewed.  Constitutional:      General: He is not in acute distress.    Appearance: Normal appearance. He is obese. He is not ill-appearing or diaphoretic.  HENT:     Head: Normocephalic.     Right Ear: Ear canal normal. No drainage or swelling. There is no impacted cerumen. Tympanic membrane is perforated.     Left Ear: Tympanic membrane, ear canal and external ear normal. There is no impacted cerumen.     Nose: Nose normal. No congestion or rhinorrhea.     Mouth/Throat:     Mouth: Mucous membranes are moist.     Pharynx: Oropharynx is clear. No oropharyngeal exudate or posterior oropharyngeal erythema.  Eyes:     General: No scleral icterus.       Right eye: No discharge.        Left eye: No discharge.     Extraocular Movements: Extraocular movements intact.      Conjunctiva/sclera: Conjunctivae normal.     Pupils: Pupils are equal, round, and reactive to light.  Neck:     Vascular: No carotid bruit.  Cardiovascular:     Rate and Rhythm: Normal rate and regular rhythm.     Pulses: Normal pulses.     Heart sounds: Murmur heard.     No friction rub. No gallop.  Pulmonary:     Effort: Pulmonary effort is normal. No respiratory distress.     Breath sounds: Normal breath sounds. No wheezing, rhonchi or rales.  Chest:     Chest wall: No tenderness.  Abdominal:     General: Bowel sounds are normal. There is no distension.     Palpations: Abdomen is soft. There is no mass.     Tenderness: There is no abdominal tenderness. There is no right CVA tenderness, left CVA tenderness, guarding or rebound.  Musculoskeletal:        General: No swelling or tenderness. Normal range of motion.     Cervical back: Normal range of motion. No rigidity or tenderness.     Right lower leg: No edema.     Left lower leg: No edema.  Lymphadenopathy:     Cervical: No cervical adenopathy.  Skin:    General: Skin is warm and dry.     Coloration: Skin is not pale.     Findings: No bruising, erythema, lesion or rash.  Neurological:     Mental Status: He is alert and oriented to person, place, and time.     Cranial Nerves: No cranial nerve deficit.     Sensory: No sensory deficit.  Motor: No weakness.     Coordination: Coordination normal.     Gait: Gait abnormal.  Psychiatric:        Mood and Affect: Mood normal.        Speech: Speech normal.        Behavior: Behavior normal.     Labs reviewed: Recent Labs    11/13/21 0435 11/14/21 0550 11/15/21 0302 11/16/21 0834 11/19/21 0326 11/24/21 1610 11/30/21 1154 12/01/21 0839 02/05/22 1515 03/06/22 1254  NA 130* 130* 130*   < > 130* 130*   < > 134* 138 141  K 4.8 4.9 4.8   < > 3.9 5.0   < > 4.4 3.2* 4.1  CL 89* 90* 91*   < > 91* 93*   < > 97* 96* 98  CO2 '24 26 24   '$ < > 25 26  --  26 31 35*  GLUCOSE 98 114*  87   < > 102* 103*   < > 119* 116* 99  BUN 64* 38* 59*   < > 32* 39*   < > 36* 17 39*  CREATININE 13.05* 9.05* 12.36*   < > 10.51* 11.57*   < > 10.55* 7.26* 9.46*  CALCIUM 9.0 9.6 9.5   < > 9.1 8.9  --  8.8* 8.5* 8.5*  MG 2.6* 2.2 2.3  --   --   --   --   --   --   --   PHOS  --  5.6* 6.3*   < > 5.7* 4.3  --  3.5  --   --    < > = values in this interval not displayed.   Recent Labs    11/07/21 2144 11/10/21 1246 11/15/21 0302 11/16/21 0834 11/24/21 1610 12/01/21 0839 03/06/22 1254  AST 11*  --  15  --   --   --  10*  ALT 11  --  11  --   --   --  10  ALKPHOS 55  --  63  --   --   --  75  BILITOT 0.7  --  1.6*  --   --   --  0.5  PROT 6.5  --  6.5  --   --   --  6.3*  ALBUMIN 3.2*   < > 3.2*   < > 3.1* 3.1* 3.9   < > = values in this interval not displayed.   Recent Labs    11/29/21 0618 11/30/21 1154 02/05/22 1515 03/06/22 1254 03/27/22 0646  WBC 4.2   < > 2.6* 2.4* 7.0  NEUTROABS 2.7  --   --  1.4* 5.9  HGB 8.0*   < > 11.1* 10.7* 10.4*  HCT 23.9*   < > 33.8* 32.5* 32.4*  MCV 93.0   < > 98.8 95.3 95.9  PLT 81*   < > 83* 48* 67*   < > = values in this interval not displayed.   Lab Results  Component Value Date   TSH 2.245 11/16/2021   Lab Results  Component Value Date   HGBA1C 4.8 05/25/2021   Lab Results  Component Value Date   CHOL 91 05/25/2021   HDL 31 (L) 05/25/2021   LDLCALC 42 05/25/2021   TRIG 95 05/25/2021   CHOLHDL 2.9 05/25/2021    Significant Diagnostic Results in last 30 days:  IR BONE MARROW BIOPSY & ASPIRATION  Result Date: 03/27/2022 INDICATION: Pancytopenia of uncertain etiology. EXAM: FLUOROSCOPIC GUIDED BONE MARROW BIOPSY AND ASPIRATION  MEDICATIONS: None ANESTHESIA/SEDATION: Moderate (conscious) sedation was employed during this procedure as administered by the Interventional Radiology RN. A total of Versed 2 mg and Fentanyl 100 mcg was administered intravenously. Moderate Sedation Time: 10 minutes. The patient's level of consciousness and  vital signs were monitored continuously by radiology nursing throughout the procedure under my direct supervision. FLUOROSCOPY TIME: 17 mGy COMPLICATIONS: None immediate. PROCEDURE: Informed consent was obtained from the patient following an explanation of the procedure, risks, benefits and alternatives. The patient understands, agrees and consents for the procedure. All questions were addressed. A time out was performed prior to the initiation of the procedure. The patient was positioned prone on the fluoroscopy table and the posterior aspect of the right iliac crest was marked fluoroscopically. The operative site was prepped and draped in the usual sterile fashion. Under sterile conditions and local anesthesia, an 11 gauge coaxial bone biopsy needle was advanced into the posterior aspect of the right iliac marrow space under intermittent fluoroscopic guidance. Multiple fluoroscopic images were saved procedural documentation purposes. Initially, a bone marrow aspiration was performed. Next, a bone marrow biopsy was obtained with the 11 gauge outer bone marrow device. Samples were prepared with the cytotechnologist and deemed adequate. The needle was removed and superficial hemostasis was obtained with manual compression. A dressing was applied. The patient tolerated the procedure well without immediate post procedural complication. IMPRESSION: Successful fluoroscopic guided right iliac bone marrow aspiration and core biopsy. Electronically Signed   By: Sandi Mariscal M.D.   On: 03/27/2022 09:16    Assessment/Plan  Benign hypertension with ESRD (end-stage renal disease) (HCC) B/p elevated. - continue on hydralazine,metoprolol,losartan,Imdur ,doxazosin and amlodipine  - advised to take clonidine if B/p > 140/90  - hydrALAZINE (APRESOLINE) 100 MG tablet; Take 1 tablet (100 mg total) by mouth 3 (three) times daily.  Dispense: 270 tablet; Refill: 3 - isosorbide mononitrate (IMDUR) 60 MG 24 hr tablet; Take 1 tablet  (60 mg total) by mouth daily.  Dispense: 90 tablet; Refill: 3 - cloNIDine (CATAPRES) 0.2 MG tablet; Take 1 tablet (0.2 mg total) by mouth every 4 (four) hours as needed (Sbp >170 or dbp>100).  Dispense: 60 tablet; Refill: 11  Family/ staff Communication: Reviewed plan of care with patient verbalized understanding   Labs/tests ordered: None   Next Appointment: Return in about 6 months (around 09/29/2022) for medical mangement of chronic issues.Sandrea Hughs, NP

## 2022-03-30 DIAGNOSIS — D631 Anemia in chronic kidney disease: Secondary | ICD-10-CM | POA: Diagnosis not present

## 2022-03-30 DIAGNOSIS — N2581 Secondary hyperparathyroidism of renal origin: Secondary | ICD-10-CM | POA: Diagnosis not present

## 2022-03-30 DIAGNOSIS — D509 Iron deficiency anemia, unspecified: Secondary | ICD-10-CM | POA: Diagnosis not present

## 2022-03-30 DIAGNOSIS — N186 End stage renal disease: Secondary | ICD-10-CM | POA: Diagnosis not present

## 2022-03-30 DIAGNOSIS — Z992 Dependence on renal dialysis: Secondary | ICD-10-CM | POA: Diagnosis not present

## 2022-03-30 LAB — SURGICAL PATHOLOGY

## 2022-04-02 DIAGNOSIS — D509 Iron deficiency anemia, unspecified: Secondary | ICD-10-CM | POA: Diagnosis not present

## 2022-04-02 DIAGNOSIS — Z992 Dependence on renal dialysis: Secondary | ICD-10-CM | POA: Diagnosis not present

## 2022-04-02 DIAGNOSIS — N186 End stage renal disease: Secondary | ICD-10-CM | POA: Diagnosis not present

## 2022-04-02 DIAGNOSIS — N2581 Secondary hyperparathyroidism of renal origin: Secondary | ICD-10-CM | POA: Diagnosis not present

## 2022-04-02 DIAGNOSIS — D631 Anemia in chronic kidney disease: Secondary | ICD-10-CM | POA: Diagnosis not present

## 2022-04-02 NOTE — Therapy (Signed)
[name] [address] [place], [place], [address] Phone: [phone]   Fax:  [phone]  Patient Details  Name: Kirk [name] MRN: [id number] Date of Birth: [birth year] Referring Provider:  No ref. provider found  Encounter Date: 04/02/2022  PHYSICAL THERAPY DISCHARGE SUMMARY  Visits from Start of Care: 4  Current functional level related to goals / functional outcomes: unknown   Remaining deficits: unknown   Education / Equipment: HEP initiated   Patient agrees to discharge. Patient goals were not met. Patient is being discharged due to not returning since the last visit.   Toniann Fail, PT 04/02/2022, 5:19 PM  [facility] [address] [place], [place], [address] Phone: [phone]   Fax:  [phone]

## 2022-04-03 ENCOUNTER — Ambulatory Visit: Payer: Self-pay

## 2022-04-03 NOTE — Patient Instructions (Signed)
Visit Information  Thank you for taking time to visit with me today. Please don't hesitate to contact me if I can be of assistance to you.   Following are the goals we discussed today:   Goals Addressed             This Visit's Progress    To have chronic pain evaluated       Care Coordination Interventions: Completed successful outbound call to wife [name] Reviewed provider established plan for pain management Discussed importance of adherence to all scheduled medical appointments Counseled on the importance of reporting any/all new or changed pain symptoms or management strategies to pain management provider Advised patient to report to care team affect of pain on daily activities Determined PCP feels elevated and fluctuating BP may be secondary to patient's chronic head and neck pain  Discussed wife [name] will call [facility] to schedule an initial consultation for evaluation of patient's chronic pain         To keep BP stable       Care Coordination Interventions: Completed successful outbound call to wife [name] Evaluation of current treatment plan related to hypertension self management and patient's adherence to plan as established by provider Reviewed medications with patient and discussed importance of compliance Advised patient, providing education and rationale, to monitor blood pressure daily and record, calling PCP for findings outside established parameters Discussed complications of poorly controlled blood pressure such as heart disease, stroke, circulatory complications, vision complications, kidney impairment, sexual dysfunction Screening for signs and symptoms of depression related to chronic disease state  Assessed social determinant of health barriers Determined wife [name] will call Dr. Debara Pickett to schedule a follow up to further evaluate fluctuating BP           Our next appointment is by telephone on 05/01/22 at 2 PM  Please call the care guide team at  [phone] if you need to cancel or reschedule your appointment.   If you are experiencing a Mental Health or [place] or need someone to talk to, please call 1-800-273-TALK (toll free, 24 hour hotline) go to [facility] [address] [place] [phone])  Patient verbalizes understanding of instructions and care plan provided today and agrees to view in [facility]. Active MyChart status and patient understanding of how to access instructions and care plan via MyChart confirmed with patient.     Barb Merino, RN, BSN, CCM Care Management Coordinator [facility] Direct Phone: [phone]

## 2022-04-03 NOTE — Patient Outreach (Signed)
  Care Coordination   Initial Visit Note   04/03/2022 Name: Kirk [name] MRN: [id number] DOB: October 22, 1960  Kirk [name] is a 62 y.o. year old male who sees Nget[place], [place], NP for primary care. I spoke with wife Kirk [name] by phone today.  What matters to the patients health and wellness today?  Wife will continue to assist overseeing patient's care. She will continue to monitor his BP at home and administer his medications exactly as prescribed.      Goals Addressed             This Visit's Progress    To have chronic pain evaluated       Care Coordination Interventions: Completed successful outbound call to wife Kirk [name] Reviewed provider established plan for pain management Discussed importance of adherence to all scheduled medical appointments Counseled on the importance of reporting any/all new or changed pain symptoms or management strategies to pain management provider Advised patient to report to care team affect of pain on daily activities Determined PCP feels elevated and fluctuating BP may be secondary to patient's chronic head and neck pain  Discussed wife Kirk [name] will call [facility] to schedule an initial consultation for evaluation of patient's chronic pain         To keep BP stable       Care Coordination Interventions: Completed successful outbound call to wife Kirk [name] Evaluation of current treatment plan related to hypertension self management and patient's adherence to plan as established by provider Reviewed medications with patient and discussed importance of compliance Advised patient, providing education and rationale, to monitor blood pressure daily and record, calling PCP for findings outside established parameters Discussed complications of poorly controlled blood pressure such as heart disease, stroke, circulatory complications, vision complications, kidney impairment, sexual dysfunction Screening for signs and symptoms of depression  related to chronic disease state  Assessed social determinant of health barriers Determined wife Kirk [name] will call Dr. Debara Pickett to schedule a follow up to further evaluate fluctuating BP       Interventions Today    Flowsheet Row Most Recent Value  Chronic Disease   Chronic disease during today's visit Hypertension (HTN)  General Interventions   General Interventions Discussed/Reviewed General Interventions Discussed, General Interventions Reviewed, Durable Medical Equipment (DME), Doctor Visits  Doctor Visits Discussed/Reviewed Doctor Visits Reviewed, Doctor Visits Discussed, PCP, Specialist  Durable Medical Equipment (DME) BP Cuff  Education Interventions   Education Provided Provided Education  Provided Verbal Education On Nutrition  Nutrition Interventions   Nutrition Discussed/Reviewed Nutrition Discussed, Nutrition Reviewed, Fluid intake  Pharmacy Interventions   Pharmacy Dicussed/Reviewed Pharmacy Topics Reviewed, Pharmacy Topics Discussed          SDOH assessments and interventions completed:  Yes  SDOH Interventions Today    Flowsheet Row Most Recent Value  SDOH Interventions   Transportation Interventions Intervention Not Indicated        Care Coordination Interventions:  Yes, provided   Follow up plan: Follow up call scheduled for 05/01/22 @2  PM    Encounter Outcome:  Pt. Visit Completed

## 2022-04-04 DIAGNOSIS — D509 Iron deficiency anemia, unspecified: Secondary | ICD-10-CM | POA: Diagnosis not present

## 2022-04-04 DIAGNOSIS — D631 Anemia in chronic kidney disease: Secondary | ICD-10-CM | POA: Diagnosis not present

## 2022-04-04 DIAGNOSIS — N2581 Secondary hyperparathyroidism of renal origin: Secondary | ICD-10-CM | POA: Diagnosis not present

## 2022-04-04 DIAGNOSIS — Z992 Dependence on renal dialysis: Secondary | ICD-10-CM | POA: Diagnosis not present

## 2022-04-04 DIAGNOSIS — N186 End stage renal disease: Secondary | ICD-10-CM | POA: Diagnosis not present

## 2022-04-04 LAB — SURGICAL PATHOLOGY

## 2022-04-06 DIAGNOSIS — Z992 Dependence on renal dialysis: Secondary | ICD-10-CM | POA: Diagnosis not present

## 2022-04-06 DIAGNOSIS — N186 End stage renal disease: Secondary | ICD-10-CM | POA: Diagnosis not present

## 2022-04-06 DIAGNOSIS — D509 Iron deficiency anemia, unspecified: Secondary | ICD-10-CM | POA: Diagnosis not present

## 2022-04-06 DIAGNOSIS — N2581 Secondary hyperparathyroidism of renal origin: Secondary | ICD-10-CM | POA: Diagnosis not present

## 2022-04-06 DIAGNOSIS — D631 Anemia in chronic kidney disease: Secondary | ICD-10-CM | POA: Diagnosis not present

## 2022-04-09 DIAGNOSIS — N186 End stage renal disease: Secondary | ICD-10-CM | POA: Diagnosis not present

## 2022-04-09 DIAGNOSIS — D509 Iron deficiency anemia, unspecified: Secondary | ICD-10-CM | POA: Diagnosis not present

## 2022-04-09 DIAGNOSIS — N2581 Secondary hyperparathyroidism of renal origin: Secondary | ICD-10-CM | POA: Diagnosis not present

## 2022-04-09 DIAGNOSIS — Z992 Dependence on renal dialysis: Secondary | ICD-10-CM | POA: Diagnosis not present

## 2022-04-09 DIAGNOSIS — D631 Anemia in chronic kidney disease: Secondary | ICD-10-CM | POA: Diagnosis not present

## 2022-04-11 DIAGNOSIS — N2581 Secondary hyperparathyroidism of renal origin: Secondary | ICD-10-CM | POA: Diagnosis not present

## 2022-04-11 DIAGNOSIS — D509 Iron deficiency anemia, unspecified: Secondary | ICD-10-CM | POA: Diagnosis not present

## 2022-04-11 DIAGNOSIS — Z992 Dependence on renal dialysis: Secondary | ICD-10-CM | POA: Diagnosis not present

## 2022-04-11 DIAGNOSIS — N186 End stage renal disease: Secondary | ICD-10-CM | POA: Diagnosis not present

## 2022-04-11 DIAGNOSIS — D631 Anemia in chronic kidney disease: Secondary | ICD-10-CM | POA: Diagnosis not present

## 2022-04-12 ENCOUNTER — Ambulatory Visit (INDEPENDENT_AMBULATORY_CARE_PROVIDER_SITE_OTHER): Payer: Medicare Other | Admitting: Family

## 2022-04-12 ENCOUNTER — Encounter (HOSPITAL_BASED_OUTPATIENT_CLINIC_OR_DEPARTMENT_OTHER): Payer: Self-pay | Admitting: Family

## 2022-04-12 VITALS — BP 204/92 | HR 60 | Ht 67.0 in | Wt 198.6 lb

## 2022-04-12 DIAGNOSIS — I350 Nonrheumatic aortic (valve) stenosis: Secondary | ICD-10-CM | POA: Diagnosis not present

## 2022-04-12 DIAGNOSIS — I120 Hypertensive chronic kidney disease with stage 5 chronic kidney disease or end stage renal disease: Secondary | ICD-10-CM | POA: Diagnosis not present

## 2022-04-12 DIAGNOSIS — I10 Essential (primary) hypertension: Secondary | ICD-10-CM

## 2022-04-12 DIAGNOSIS — I428 Other cardiomyopathies: Secondary | ICD-10-CM

## 2022-04-12 DIAGNOSIS — Z992 Dependence on renal dialysis: Secondary | ICD-10-CM

## 2022-04-12 DIAGNOSIS — N186 End stage renal disease: Secondary | ICD-10-CM | POA: Diagnosis not present

## 2022-04-12 MED ORDER — CLONIDINE HCL 0.2 MG PO TABS: 0.2000 mg | ORAL_TABLET | Freq: Four times a day (QID) | ORAL | 1 refills | Status: DC | PRN

## 2022-04-12 MED ORDER — ISOSORBIDE MONONITRATE ER 60 MG PO TB24: 60.0000 mg | ORAL_TABLET | Freq: Two times a day (BID) | ORAL | 3 refills | Status: DC

## 2022-04-12 NOTE — Patient Instructions (Addendum)
Medication Instructions:   Your physician has recommended you make the following change in your medication:  INCREASE Isosorbide to 60mg  twice daily *If you need a refill on your cardiac medications before your next appointment, please call your pharmacy*  Follow-Up: At [facility], you and your health needs are our priority.  As part of our continuing mission to provide you with exceptional heart care, we have created designated Provider Care Teams.  These Care Teams include your primary Cardiologist (physician) and Advanced Practice Providers (APPs -  Physician Assistants and Nurse Practitioners) who all work together to provide you with the care you need, when you need it.  We recommend signing up for the patient portal called "MyChart".  Sign up information is provided on this After Visit Summary.  MyChart is used to connect with patients for Virtual Visits (Telemedicine).  Patients are able to view lab/test results, encounter notes, upcoming appointments, etc.  Non-urgent messages can be sent to your provider as well.   To learn more about what you can do with MyChart, go to [email or web address].    Your next appointment:   2 week(s)  Provider:   K. Mali Hilty, MD or Laurann [place], NP    Other Instructions Please bring BP cuff and log to next office visit.

## 2022-04-12 NOTE — Progress Notes (Signed)
Office Visit    Patient Name: Kirk [name] Date of Encounter: 04/12/2022  PCP:  Sandrea Hughs, NP   [place]  Cardiologist:  [name], MD  Advanced Practice Provider:  No care team member to display Electrophysiologist:  None    Chief Complaint    Kirk [name] is a 62 y.o. 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  Past Medical History    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)    Anxiety    Asthma    Asthma    moderate persistent   Carpal tunnel syndrome    Cellulitis and abscess of trunk    Cholelithiasis 07/13/2014   Chronic headaches    Debility, unspecified    Dermatophytosis of the body    Dysrhythmia    history of   Edema    End stage renal disease on dialysis [facility])    "MWF; [facility]" (10/21/2014)   Essential hypertension, benign    GERD (gastroesophageal reflux disease)    Gout, unspecified    HTN (hypertension)    Hypertrophy of prostate without urinary obstruction and other lower urinary tract symptoms (LUTS)    Hypotension, unspecified    Impotence of organic origin    Insomnia, unspecified    Kidney replaced by transplant    Localization-related (focal) (partial) epilepsy and epileptic syndromes with complex partial seizures, without mention of intractable epilepsy    12-15-19- Wife states he has NEVER had a seizure    Lumbago    Memory loss    OSA on CPAP    Other and unspecified hyperlipidemia    controlled /managed per wife    Other chronic nonalcoholic liver disease    Other malaise and fatigue    Other nonspecific abnormal serum enzyme levels    Pain in joint, lower leg    Pain in joint, upper arm     Pneumonia "several times"   PONV (postoperative nausea and vomiting)    Renal dialysis status(V45.11) 02/05/2010   restarted 01/02/13 ofter renal trransplant failure   Secondary hyperparathyroidism (of renal origin)    Shortness of breath    Sleep apnea    wears cpap    Tension headache    Unspecified constipation    Unspecified essential hypertension    Unspecified hereditary and idiopathic peripheral neuropathy    Unspecified vitamin D deficiency    Past Surgical History:  Procedure Laterality Date   AV FISTULA PLACEMENT Left ?2010   "forearm; at [place]"   [place]  03/21/2011   CHOLECYSTECTOMY N/A 10/21/2014   Procedure: LAPAROSCOPIC CHOLECYSTECTOMY WITH INTRAOPERATIVE CHOLANGIOGRAM;  Surgeon: Autumn Messing III, MD;  Location: [place];  Service: General;  Laterality: N/A;   COLONOSCOPY     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   IR ANGIO INTRA EXTRACRAN SEL INTERNAL CAROTID UNI R MOD SED  11/30/2021   IR ANGIOGRAM FOLLOW UP STUDY  11/30/2021   IR BONE MARROW BIOPSY & ASPIRATION  03/27/2022   IR NEURO EACH ADD'L AFTER BASIC UNI RIGHT (MS)  11/30/2021   IR TRANSCATH/EMBOLIZ  11/30/2021  KIDNEY TRANSPLANT  08/17/2011   [facility]    LAPAROSCOPIC CHOLECYSTECTOMY  10/21/2014   w/IOC   LEFT HEART CATHETERIZATION WITH CORONARY ANGIOGRAM N/A 03/21/2011   Procedure: LEFT HEART CATHETERIZATION WITH CORONARY ANGIOGRAM;  Surgeon: [name], MD;  Location: [facility] CATH LAB;  Service: Cardiovascular;  Laterality: N/A;   NEPHRECTOMY  08/2013   removed transplaned kidney   POLYPECTOMY     POSTERIOR FUSION CERVICAL SPINE  06/25/2012   for spinal stenosis   RADIOLOGY WITH ANESTHESIA N/A 11/30/2021   Procedure: RIGHT MIDDLE MENINGEAL ARTERY EMBOLIZATION;  Surgeon: Radiologist, Medication, MD;  Location: [place];  Service: Radiology;  Laterality: N/A;   VASECTOMY  2010    Allergies  Allergies  Allergen Reactions   Codeine Nausea And  Vomiting   Hydrocodone-Acetaminophen Nausea And Vomiting    History of Present Illness    Kirk [name] is a 62 y.o. 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 Debara Pickett.  Previous cardiac catheterization 2016 with no significant obstructive coronary disease.  He had chest pain at the time associated with dialysis.  Echocardiogram August 2018 mild LVH, LVEF 55 to 60%.  CT angiography performed January 2020 for sharp chest pain with no PE though did show bronchitis and small airway bronchiolitis.  He was treated with antibiotics.  He did have atrial fibrillation at the time though given CHA2DS2-VASc score of 1 he was not recommended for anticoagulation.  Echocardiogram 01/2018 LVEF 55% with no regional wall motion abnormalities.  Seen 08/2020 for preoperative clearance for cervical spine surgery which was provided. Saw Dr. Debara Pickett noting chest pain much improved with HD. No changes recommended.   Admitted 10/17-10/31/23 with new onset seizure at home 2 minutes. CT head with subdural hematoma. Neurology and neurosurgery consulted. Keppra transitioned to Depakote but was transitioned back to keppra. Echo LVEF 50-55%, no RWMA, gr1DD, mild AS (mean gradient 24mmHg). 11/14/21 developed right sided gaze and left neglect with CT head no stroke, not candidate for TPA. Felt to be related to seizure activity and resolved. MRI 11/16/21 suggested rebleeding, no surgical intervention recommended. BP noted to be difficult to control and Imdur, Hydralazine doses increased while hospitalized. He was treated with IV abx for bacteremia concern for CIRS. He did have PAF 11/15/21 which converted with dose of Cardizem (had not had Metoprolol in 36h due to mental status) and converted to NSR within one hour. Discharged to inpatient rehab. He was readmitted and required right middle meningeal artery embolization  11/30/21.   Presents today for follow up with his family. Wife notes blood pressure has been elevated starting after hospitalization. Lowest blood pressure 160-180 by home readings. Has been very elevated >200 at HD. He had Clonidine PRN for SBP >180 but has been out for one week. Did have some right ear bleeding which resolved yesterday - of note recent PCP exam demonstrated perforated tympanic membrane and is using qtips. Reports ongoing lethargy which he attributes to Patrick which he will be on until October/November. Describes chest pain as feeling like a "pin" through his chest when he is due for HD.   BP in clinic visits:  02/28/22 122/88 03/06/22 178/72  EKGs/Labs/Other Studies Reviewed:   The following studies were reviewed today:  Cardiac Studies & Procedures     STRESS TESTS  MYOCARDIAL PERFUSION IMAGING 09/02/2018  Narrative  Nuclear stress EF: 38%.  The  left ventricular ejection fraction is moderately decreased (30-44%).  There was no ST segment deviation noted during stress.  This is an intermediate risk study.  1. EF 38%, diffuse hypokinesis. 2. Fixed small, mild mid to apical inferior perfusion defect.  I suspect that this is more likely diaphragmatic attenuation then prior infarction.  No evidence for ischemia.  Intermediate risk study due to low EF.  Suspect nonischemic cardiomyopathy.   ECHOCARDIOGRAM  ECHOCARDIOGRAM COMPLETE 11/16/2021  Narrative ECHOCARDIOGRAM REPORT    Patient Name:   Kirk [name] Date of Exam: 11/16/2021 Medical Rec #:  [id number]       Height:       67.0 in Accession #:    [id number]      Weight:       200.0 lb Date of Birth:  1960-07-23        BSA:          2.022 m Patient Age:    59 years        BP:           175/70 mmHg Patient Gender: M               HR:           61 bpm. Exam Location:  Inpatient  Procedure: 2D Echo, Color Doppler and Cardiac Doppler  Indications:    I48.91* Unspecified atrial fibrillation  History:         Patient has prior history of Echocardiogram examinations, most recent 09/08/2020. Risk Factors:Hypertension, Diabetes, Dyslipidemia and Sleep Apnea.  Sonographer:    Raquel Sarna Senior RDCS Referring Phys: [place]   1. Left ventricular ejection fraction, by estimation, is 50 to 55%. The left ventricle has low normal function. The left ventricle has no regional wall motion abnormalities. Left ventricular diastolic parameters are consistent with Grade I diastolic dysfunction (impaired relaxation). 2. Right ventricular systolic function is normal. The right ventricular size is normal. Tricuspid regurgitation signal is inadequate for assessing PA pressure. 3. Left atrial size was mildly dilated. 4. The mitral valve is normal in structure. Trivial mitral valve regurgitation. No evidence of mitral stenosis. 5. The aortic valve is normal in structure. Aortic valve regurgitation is not visualized. Mild aortic valve stenosis. Aortic valve area, by VTI measures 1.69 cm. Aortic valve mean gradient measures 8.0 mmHg. Aortic valve Vmax measures 1.92 m/s. 6. The inferior vena cava is normal in size with greater than 50% respiratory variability, suggesting right atrial pressure of 3 mmHg.  FINDINGS Left Ventricle: Left ventricular ejection fraction, by estimation, is 50 to 55%. The left ventricle has low normal function. The left ventricle has no regional wall motion abnormalities. The left ventricular internal cavity size was normal in size. There is no left ventricular hypertrophy. Left ventricular diastolic parameters are consistent with Grade I diastolic dysfunction (impaired relaxation).  Right Ventricle: The right ventricular size is normal. No increase in right ventricular wall thickness. Right ventricular systolic function is normal. Tricuspid regurgitation signal is inadequate for assessing PA pressure.  Left Atrium: Left atrial size was mildly dilated.  Right Atrium: Right  atrial size was normal in size.  Pericardium: There is no evidence of pericardial effusion. Presence of epicardial fat layer.  Mitral Valve: The mitral valve is normal in structure. Trivial mitral valve regurgitation. No evidence of mitral valve stenosis.  Tricuspid Valve: The tricuspid valve is normal in structure. Tricuspid valve regurgitation is not demonstrated. No evidence of tricuspid stenosis.  Aortic Valve: The aortic  valve is normal in structure. Aortic valve regurgitation is not visualized. Mild aortic stenosis is present. Aortic valve mean gradient measures 8.0 mmHg. Aortic valve peak gradient measures 14.7 mmHg. Aortic valve area, by VTI measures 1.69 cm.  Pulmonic Valve: The pulmonic valve was normal in structure. Pulmonic valve regurgitation is not visualized. No evidence of pulmonic stenosis.  Aorta: The aortic root is normal in size and structure.  Venous: The inferior vena cava is normal in size with greater than 50% respiratory variability, suggesting right atrial pressure of 3 mmHg.  IAS/Shunts: No atrial level shunt detected by color flow Doppler.   LEFT VENTRICLE PLAX 2D LVIDd:         5.70 cm   Diastology LVIDs:         4.10 cm   LV e' medial:    6.42 cm/s LV PW:         1.20 cm   LV E/e' medial:  19.0 LV IVS:        0.90 cm   LV e' lateral:   7.07 cm/s LVOT diam:     2.20 cm   LV E/e' lateral: 17.3 LV SV:         76 LV SV Index:   38 LVOT Area:     3.80 cm   RIGHT VENTRICLE RV S prime:     13.40 cm/s TAPSE (M-mode): 2.4 cm  LEFT ATRIUM             Index        RIGHT ATRIUM           Index LA diam:        4.40 cm 2.18 cm/m   RA Area:     18.30 cm LA Vol (A2C):   72.9 ml 36.03 ml/m  RA Volume:   50.20 ml  24.83 ml/m LA Vol (A4C):   63.6 ml 31.45 ml/m LA Biplane Vol: 69.9 ml 34.57 ml/m AORTIC VALVE AV Area (Vmax):    1.69 cm AV Area (Vmean):   1.89 cm AV Area (VTI):     1.69 cm AV Vmax:           192.00 cm/s AV Vmean:          133.000  cm/s AV VTI:            0.452 m AV Peak Grad:      14.7 mmHg AV Mean Grad:      8.0 mmHg LVOT Vmax:         85.40 cm/s LVOT Vmean:        66.000 cm/s LVOT VTI:          0.201 m LVOT/AV VTI ratio: 0.44  AORTA Ao Root diam: 3.10 cm Ao Asc diam:  3.50 cm  MITRAL VALVE MV Area (PHT): 3.61 cm     SHUNTS MV Decel Time: 210 msec     Systemic VTI:  0.20 m MV E velocity: 122.00 cm/s  Systemic Diam: 2.20 cm MV A velocity: 95.40 cm/s MV E/A ratio:  1.28  Kardie Tobb DO Electronically signed by Berniece Salines DO Signature Date/Time: 11/16/2021/4:05:41 PM    Final             EKG:  EKG is not ordered today.    Recent Labs: 11/15/2021: Magnesium 2.3 11/16/2021: TSH 2.245 03/06/2022: ALT 10; BUN 39; Creatinine 9.46; Potassium 4.1; Sodium 141 03/27/2022: Hemoglobin 10.4; Platelets 67  Recent Lipid Panel    Component Value Date/Time   CHOL  91 05/25/2021 0814   CHOL 195 06/29/2015 1533   TRIG 95 05/25/2021 0814   HDL 31 (L) 05/25/2021 0814   HDL 17 (L) 06/29/2015 1533   CHOLHDL 2.9 05/25/2021 0814   VLDL 32 01/28/2018 0242   LDLCALC 42 05/25/2021 0814   Home Medications   Current Meds  Medication Sig   acetaminophen (TYLENOL) 500 MG tablet Take 1 tablet (500 mg total) by mouth every 6 (six) hours as needed for moderate pain.   amLODipine (NORVASC) 10 MG tablet Take 1 tablet (10 mg total) by mouth every evening.   aspirin 81 MG chewable tablet Chew 81 mg by mouth daily.   budesonide-formoterol (SYMBICORT) 160-4.5 MCG/ACT inhaler Inhale 2 puffs into the lungs as needed.   calcitRIOL (ROCALTROL) 0.5 MCG capsule Take 2 capsules (1 mcg total) by mouth every Monday, Wednesday, and Friday with hemodialysis.   Cholecalciferol (VITAMIN D3) 25 MCG (1000 UT) CAPS Take 4,000 Units by mouth daily.   cinacalcet (SENSIPAR) 30 MG tablet Take 2 tablets (60 mg total) by mouth 3 (three) times a week.   cloNIDine (CATAPRES) 0.2 MG tablet Take 1 tablet (0.2 mg total) by mouth every 4 (four) hours as  needed (Sbp >170 or dbp>100).   diphenhydrAMINE (BENADRYL) 25 mg capsule Take 25 mg by mouth every 6 (six) hours as needed for allergies.   doxazosin (CARDURA) 4 MG tablet TAKE 1 TABLET BY MOUTH EVERY DAY   DULoxetine (CYMBALTA) 30 MG capsule TAKE 1 CAPSULE BY MOUTH EVERY DAY   FOSRENOL 1000 MG PACK Take 1 packet by mouth 3 (three) times daily.   guaiFENesin (MUCINEX) 600 MG 12 hr tablet Take 1,200 mg by mouth 2 (two) times daily.   hydrALAZINE (APRESOLINE) 100 MG tablet Take 1 tablet (100 mg total) by mouth 3 (three) times daily.   isosorbide mononitrate (IMDUR) 60 MG 24 hr tablet Take 1 tablet (60 mg total) by mouth daily.   levETIRAcetam (KEPPRA) 250 MG tablet TAKE 1 TABLET (250 MG TOTAL) BY MOUTH EVERY MONDAY, WEDNESDAY, AND FRIDAY WITH HEMODIALYSIS.   levETIRAcetam (KEPPRA) 750 MG tablet Take 1 tablet (750 mg total) by mouth daily.   losartan (COZAAR) 100 MG tablet Take 1 tablet (100 mg total) by mouth every evening.   methocarbamol (ROBAXIN) 750 MG tablet Take 1 tablet (750 mg total) by mouth 4 (four) times daily.   metoprolol tartrate (LOPRESSOR) 100 MG tablet Take 1 tablet (100 mg total) by mouth 2 (two) times daily.   multivitamin (RENA-VIT) TABS tablet Take 1 tablet by mouth daily.   ondansetron (ZOFRAN-ODT) 4 MG disintegrating tablet TAKE 1 TABLET BY MOUTH EVERY 8 HOURS AS NEEDED FOR NAUSEA AND VOMITING   oxyCODONE (OXY IR/ROXICODONE) 5 MG immediate release tablet Take 1-2 tablets (5-10 mg total) by mouth every 4 (four) hours as needed for moderate pain.   pantoprazole (PROTONIX) 40 MG tablet Take 1 tablet (40 mg total) by mouth 2 (two) times daily before a meal.   pravastatin (PRAVACHOL) 40 MG tablet TAKE 1 TABLET BY MOUTH EVERY DAY IN THE EVENING   senna-docusate (SENOKOT-S) 8.6-50 MG tablet Take 1 tablet by mouth 2 (two) times daily.   sevelamer carbonate (RENVELA) 800 MG tablet Take 2 tablets (1,600 mg total) by mouth as needed (snacks).   sevelamer carbonate (RENVELA) 800 MG  tablet Take 4 tablets (3,200 mg total) by mouth 3 (three) times daily with meals.   sucralfate (CARAFATE) 1 GM/10ML suspension Take 10 mLs (1 g total) by mouth 4 (four) times  daily -  with meals and at bedtime.   traZODone (DESYREL) 50 MG tablet Take 1 tablet (50 mg total) by mouth at bedtime.     Review of Systems      All other systems reviewed and are otherwise negative except as noted above.  Physical Exam    VS:  BP (!) 204/92 (BP Location: Right Arm, Patient Position: Sitting, Cuff Size: Large)   Pulse 60   Ht 5\' 7"  (1.702 m)   Wt 198 lb 9.6 oz (90.1 kg)   SpO2 98%   BMI 31.11 kg/m  , BMI Body mass index is 31.11 kg/m.  Wt Readings from Last 3 Encounters:  04/12/22 198 lb 9.6 oz (90.1 kg)  03/29/22 194 lb 12.8 oz (88.4 kg)  03/27/22 201 lb 3.2 oz (91.3 kg)     GEN: Well nourished, well developed, in no acute distress. HEENT: normal. Neck: Supple, no JVD, carotid bruits, or masses. Cardiac: RRR, no murmurs, rubs, or gallops. No clubbing, cyanosis, edema.  Radials/PT 2+ and equal bilaterally.  Respiratory:  Respirations regular and unlabored, clear to auscultation bilaterally. GI: Soft, nontender, nondistended. MS: No deformity or atrophy. Skin: Warm and dry, no rash. Neuro:  Strength and sensation are intact. Psych: Normal affect.  Assessment & Plan    HTN - BP markedly elevated. Increase Imdur to 60mg  BID. Refill of Clonidine provided to use PRN for SBP >180. Continue Amlodipine 10mg  QD, Doxazosin 4mg  QHS, Hydralazine 100mg  TID, Losartan 100mg  QD. If BP not controlled at follow up, plan to increase Doxazosin. Not renal denervation candidate due to renal function.   Subdural hemorrhage - Admission 10/2021. Follows with neurology.   ESRD on HD - s/p failed renal transplant. volume management per HD.  Continue to follow with nephrology.  NICM -with recovered LVEF.  Most recent echo 11/16/21 normal LVEF 50-55%. No signs of worsening heart failure.  Volume management  per hemodialysis. BP control, as above.  Mild aortic stenosis - Most recent echo 11/16/21 mild AS with mean gradient 72mmHg. Continue optimal BP control.  Disposition: Follow up in 2 week(s) with Dr. Debara Pickett or APP.  Signed, Loel Dubonnet, NP 04/12/2022, 3:45 PM [place]

## 2022-04-13 DIAGNOSIS — N186 End stage renal disease: Secondary | ICD-10-CM | POA: Diagnosis not present

## 2022-04-13 DIAGNOSIS — N2581 Secondary hyperparathyroidism of renal origin: Secondary | ICD-10-CM | POA: Diagnosis not present

## 2022-04-13 DIAGNOSIS — Z992 Dependence on renal dialysis: Secondary | ICD-10-CM | POA: Diagnosis not present

## 2022-04-13 DIAGNOSIS — D509 Iron deficiency anemia, unspecified: Secondary | ICD-10-CM | POA: Diagnosis not present

## 2022-04-13 DIAGNOSIS — D631 Anemia in chronic kidney disease: Secondary | ICD-10-CM | POA: Diagnosis not present

## 2022-04-14 ENCOUNTER — Encounter (HOSPITAL_BASED_OUTPATIENT_CLINIC_OR_DEPARTMENT_OTHER): Payer: Self-pay | Admitting: Family

## 2022-04-16 DIAGNOSIS — D509 Iron deficiency anemia, unspecified: Secondary | ICD-10-CM | POA: Diagnosis not present

## 2022-04-16 DIAGNOSIS — Z992 Dependence on renal dialysis: Secondary | ICD-10-CM | POA: Diagnosis not present

## 2022-04-16 DIAGNOSIS — N2581 Secondary hyperparathyroidism of renal origin: Secondary | ICD-10-CM | POA: Diagnosis not present

## 2022-04-16 DIAGNOSIS — D631 Anemia in chronic kidney disease: Secondary | ICD-10-CM | POA: Diagnosis not present

## 2022-04-16 DIAGNOSIS — N186 End stage renal disease: Secondary | ICD-10-CM | POA: Diagnosis not present

## 2022-04-17 DIAGNOSIS — M545 Low back pain, unspecified: Secondary | ICD-10-CM | POA: Diagnosis not present

## 2022-04-17 DIAGNOSIS — Z6829 Body mass index (BMI) 29.0-29.9, adult: Secondary | ICD-10-CM | POA: Diagnosis not present

## 2022-04-17 DIAGNOSIS — Z79899 Other long term (current) drug therapy: Secondary | ICD-10-CM | POA: Diagnosis not present

## 2022-04-17 DIAGNOSIS — R5383 Other fatigue: Secondary | ICD-10-CM | POA: Diagnosis not present

## 2022-04-17 DIAGNOSIS — E559 Vitamin D deficiency, unspecified: Secondary | ICD-10-CM | POA: Diagnosis not present

## 2022-04-17 DIAGNOSIS — M542 Cervicalgia: Secondary | ICD-10-CM | POA: Diagnosis not present

## 2022-04-17 DIAGNOSIS — M129 Arthropathy, unspecified: Secondary | ICD-10-CM | POA: Diagnosis not present

## 2022-04-17 DIAGNOSIS — Z125 Encounter for screening for malignant neoplasm of prostate: Secondary | ICD-10-CM | POA: Diagnosis not present

## 2022-04-17 DIAGNOSIS — Z1159 Encounter for screening for other viral diseases: Secondary | ICD-10-CM | POA: Diagnosis not present

## 2022-04-17 DIAGNOSIS — M546 Pain in thoracic spine: Secondary | ICD-10-CM | POA: Diagnosis not present

## 2022-04-17 DIAGNOSIS — G8929 Other chronic pain: Secondary | ICD-10-CM | POA: Diagnosis not present

## 2022-04-17 DIAGNOSIS — Z131 Encounter for screening for diabetes mellitus: Secondary | ICD-10-CM | POA: Diagnosis not present

## 2022-04-18 DIAGNOSIS — N2581 Secondary hyperparathyroidism of renal origin: Secondary | ICD-10-CM | POA: Diagnosis not present

## 2022-04-18 DIAGNOSIS — Z992 Dependence on renal dialysis: Secondary | ICD-10-CM | POA: Diagnosis not present

## 2022-04-18 DIAGNOSIS — D631 Anemia in chronic kidney disease: Secondary | ICD-10-CM | POA: Diagnosis not present

## 2022-04-18 DIAGNOSIS — N186 End stage renal disease: Secondary | ICD-10-CM | POA: Diagnosis not present

## 2022-04-18 DIAGNOSIS — D509 Iron deficiency anemia, unspecified: Secondary | ICD-10-CM | POA: Diagnosis not present

## 2022-04-20 ENCOUNTER — Other Ambulatory Visit (HOSPITAL_BASED_OUTPATIENT_CLINIC_OR_DEPARTMENT_OTHER): Payer: Self-pay | Admitting: Family

## 2022-04-20 DIAGNOSIS — N2581 Secondary hyperparathyroidism of renal origin: Secondary | ICD-10-CM | POA: Diagnosis not present

## 2022-04-20 DIAGNOSIS — D631 Anemia in chronic kidney disease: Secondary | ICD-10-CM | POA: Diagnosis not present

## 2022-04-20 DIAGNOSIS — D509 Iron deficiency anemia, unspecified: Secondary | ICD-10-CM | POA: Diagnosis not present

## 2022-04-20 DIAGNOSIS — Z992 Dependence on renal dialysis: Secondary | ICD-10-CM | POA: Diagnosis not present

## 2022-04-20 DIAGNOSIS — N186 End stage renal disease: Secondary | ICD-10-CM | POA: Diagnosis not present

## 2022-04-20 DIAGNOSIS — I120 Hypertensive chronic kidney disease with stage 5 chronic kidney disease or end stage renal disease: Secondary | ICD-10-CM

## 2022-04-23 ENCOUNTER — Telehealth: Payer: Self-pay | Admitting: *Deleted

## 2022-04-23 DIAGNOSIS — D631 Anemia in chronic kidney disease: Secondary | ICD-10-CM | POA: Diagnosis not present

## 2022-04-23 DIAGNOSIS — T8612 Kidney transplant failure: Secondary | ICD-10-CM | POA: Diagnosis not present

## 2022-04-23 DIAGNOSIS — D509 Iron deficiency anemia, unspecified: Secondary | ICD-10-CM | POA: Diagnosis not present

## 2022-04-23 DIAGNOSIS — Z992 Dependence on renal dialysis: Secondary | ICD-10-CM | POA: Diagnosis not present

## 2022-04-23 DIAGNOSIS — N186 End stage renal disease: Secondary | ICD-10-CM | POA: Diagnosis not present

## 2022-04-23 DIAGNOSIS — N2581 Secondary hyperparathyroidism of renal origin: Secondary | ICD-10-CM | POA: Diagnosis not present

## 2022-04-23 NOTE — Telephone Encounter (Signed)
This RN sent scheduling request per MD for f/u with lab in 3 months post his call to patient per review of his BMBX results.

## 2022-04-24 ENCOUNTER — Encounter (HOSPITAL_BASED_OUTPATIENT_CLINIC_OR_DEPARTMENT_OTHER): Payer: Self-pay | Admitting: Family

## 2022-04-24 ENCOUNTER — Ambulatory Visit (INDEPENDENT_AMBULATORY_CARE_PROVIDER_SITE_OTHER): Payer: Medicare Other | Admitting: Family

## 2022-04-24 VITALS — BP 180/76 | HR 53 | Ht 67.0 in | Wt 192.0 lb

## 2022-04-24 DIAGNOSIS — Z79899 Other long term (current) drug therapy: Secondary | ICD-10-CM | POA: Diagnosis not present

## 2022-04-24 DIAGNOSIS — I1A0 Resistant hypertension: Secondary | ICD-10-CM

## 2022-04-24 DIAGNOSIS — I428 Other cardiomyopathies: Secondary | ICD-10-CM

## 2022-04-24 DIAGNOSIS — N186 End stage renal disease: Secondary | ICD-10-CM | POA: Diagnosis not present

## 2022-04-24 DIAGNOSIS — I350 Nonrheumatic aortic (valve) stenosis: Secondary | ICD-10-CM | POA: Diagnosis not present

## 2022-04-24 MED ORDER — DOXAZOSIN MESYLATE 8 MG PO TABS: 8.0000 mg | ORAL_TABLET | Freq: Every day | ORAL | 3 refills | Status: DC

## 2022-04-24 MED ORDER — VALSARTAN 160 MG PO TABS: 160.0000 mg | ORAL_TABLET | Freq: Every day | ORAL | 3 refills | Status: DC

## 2022-04-24 MED ORDER — ISOSORBIDE MONONITRATE ER 60 MG PO TB24: 60.0000 mg | ORAL_TABLET | Freq: Every day | ORAL | 3 refills | Status: DC

## 2022-04-24 NOTE — Progress Notes (Signed)
Office Visit    Patient Name: Kirk [name] Date of Encounter: 04/24/2022  PCP:  Kirk Hughs, NP   [place]  Cardiologist:  Kirk Casino, MD  Advanced Practice Provider:  No care team member to display Electrophysiologist:  None  Nephrologist:    Chief Complaint    Kirk [name] is a 62 y.o. 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  Past Medical History    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)    Anxiety    Asthma    Asthma    moderate persistent   Carpal tunnel syndrome    Cellulitis and abscess of trunk    Cholelithiasis 07/13/2014   Chronic headaches    Debility, unspecified    Dermatophytosis of the body    Dysrhythmia    history of   Edema    End stage renal disease on dialysis    "MWF; [facility]" (10/21/2014)   Essential hypertension, benign    GERD (gastroesophageal reflux disease)    Gout, unspecified    HTN (hypertension)    Hypertrophy of prostate without urinary obstruction and other lower urinary tract symptoms (LUTS)    Hypotension, unspecified    Impotence of organic origin    Insomnia, unspecified    Kidney replaced by transplant    Localization-related (focal) (partial) epilepsy and epileptic syndromes with complex partial seizures, without mention of intractable epilepsy    12-15-19- Wife states he has NEVER had a seizure    Lumbago    Memory loss    OSA on CPAP    Other and unspecified hyperlipidemia    controlled /managed per wife    Other chronic nonalcoholic liver disease    Other malaise and fatigue    Other nonspecific abnormal serum enzyme levels    Pain in joint, lower leg    Pain in joint,  upper arm    Pneumonia "several times"   PONV (postoperative nausea and vomiting)    Renal dialysis status(V45.11) 02/05/2010   restarted 01/02/13 ofter renal trransplant failure   Secondary hyperparathyroidism (of renal origin)    Shortness of breath    Sleep apnea    wears cpap    Tension headache    Unspecified constipation    Unspecified essential hypertension    Unspecified hereditary and idiopathic peripheral neuropathy    Unspecified vitamin D deficiency    Past Surgical History:  Procedure Laterality Date   AV FISTULA PLACEMENT Left ?2010   "forearm; at [place]"   [place]  03/21/2011   CHOLECYSTECTOMY N/A 10/21/2014   Procedure: LAPAROSCOPIC CHOLECYSTECTOMY WITH INTRAOPERATIVE CHOLANGIOGRAM;  Surgeon: Kirk Messing III, MD;  Location: [place];  Service: General;  Laterality: N/A;   COLONOSCOPY     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   IR ANGIO INTRA EXTRACRAN SEL INTERNAL CAROTID UNI R MOD SED  11/30/2021   IR ANGIOGRAM FOLLOW UP STUDY  11/30/2021   IR BONE MARROW BIOPSY & ASPIRATION  03/27/2022   IR NEURO EACH ADD'L AFTER BASIC UNI RIGHT (MS)  11/30/2021   IR TRANSCATH/EMBOLIZ  11/30/2021  KIDNEY TRANSPLANT  08/17/2011   [facility]    LAPAROSCOPIC CHOLECYSTECTOMY  10/21/2014   w/IOC   LEFT HEART CATHETERIZATION WITH CORONARY ANGIOGRAM N/A 03/21/2011   Procedure: LEFT HEART CATHETERIZATION WITH CORONARY ANGIOGRAM;  Surgeon: Kirk Casino, MD;  Location: [facility] CATH LAB;  Service: Cardiovascular;  Laterality: N/A;   NEPHRECTOMY  08/2013   removed transplaned kidney   POLYPECTOMY     POSTERIOR FUSION CERVICAL SPINE  06/25/2012   for spinal stenosis   RADIOLOGY WITH ANESTHESIA N/A 11/30/2021   Procedure: RIGHT MIDDLE MENINGEAL ARTERY EMBOLIZATION;  Surgeon: Radiologist, Medication, MD;  Location: [place];  Service: Radiology;  Laterality: N/A;   VASECTOMY  2010    Allergies  Allergies  Allergen Reactions   Codeine  Nausea And Vomiting   Hydrocodone-Acetaminophen Nausea And Vomiting    History of Present Illness    Kirk [name] is a 62 y.o. 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  He had chest pain at the time associated with dialysis.  Echocardiogram August 2018 mild LVH, LVEF 55 to 60%.  CT angiography performed January 2020 for sharp chest pain with no PE though did show bronchitis and small airway bronchiolitis.  He was treated with antibiotics.  He did have atrial fibrillation at the time though given CHA2DS2-VASc score of 1 he was not recommended for anticoagulation.  Echocardiogram 01/2018 LVEF 55% with no regional wall motion abnormalities.  Seen 08/2020 for preoperative clearance for cervical spine surgery which was provided. Saw Dr. Debara [name] noting chest pain much improved with HD. No changes recommended.   Admitted 10/17-10/31/23 with new onset seizure at home 2 minutes. CT head with subdural hematoma. Neurology and neurosurgery consulted. Keppra transitioned to Depakote but was transitioned back to keppra. Echo LVEF 50-55%, no RWMA, gr1DD, mild AS (mean gradient 87mmHg). 11/14/21 developed right sided gaze and left neglect with CT head no stroke, not candidate for TPA. Felt to be related to seizure activity and resolved. MRI 11/16/21 suggested rebleeding, no surgical intervention recommended. BP noted to be difficult to control and Imdur, Hydralazine doses increased while hospitalized. He was treated with IV abx for bacteremia concern for CIRS. He did have PAF 11/15/21 which converted with dose of Cardizem (had not had Metoprolol in 36h due to mental status) and converted to NSR within one hour. Discharged to inpatient rehab. He was readmitted and required right middle meningeal artery embolization  11/30/21.   Last seen 04/12/22 with BP in clinic 204/92 with home readings 160-180. Isosorbide was increased to 60mg  BID. PRN Clonidine was refilled.   He presents today for follow up with his wife. BP yesterday at dialysis 145/55 prior to leaving 211/87. Home readings as low as 138/68 but overall labile with systolic readings including 197, 184, 179, 167.  Taking clonidine prior to HD Monday, Wednesday, Friday and often taking another evening dose. Wife notes he has been more lethargic. We discussed this could be related to Clonidine. No clear pattern to drowsiness.  Also notes taking Benadryl twice per day and encouraged to trial Zyrtec or Claritin instead.  Over the weekend had chest pain across his chest which he continues to describe as "pin" which is similar to his previous chest discomfort with volume overload on non HD days.   He also notes headache which are keeping him  up at night. Has been up since 2 AM this morning due to headache. Reports headaches are ongoing every day. Reports his headache may be a bit worse than previous. Was told at the hospital that headaches would likely be a long term issue.   EKGs/Labs/Other Studies Reviewed:   The following studies were reviewed today:  Cardiac Studies & Procedures     STRESS TESTS  MYOCARDIAL PERFUSION IMAGING 09/02/2018  Narrative  Nuclear stress EF: 38%.  The left ventricular ejection fraction is moderately decreased (30-44%).  There was no ST segment deviation noted during stress.  This is an intermediate risk study.  1. EF 38%, diffuse hypokinesis. 2. Fixed small, mild mid to apical inferior perfusion defect.  I suspect that this is more likely diaphragmatic attenuation then prior infarction.  No evidence for ischemia.  Intermediate risk study due to low EF.  Suspect nonischemic cardiomyopathy.   ECHOCARDIOGRAM  ECHOCARDIOGRAM COMPLETE 11/16/2021  Narrative ECHOCARDIOGRAM REPORT    Patient Name:   Kirk [name] Date  of Exam: 11/16/2021 Medical Rec #:  [id number]       Height:       67.0 in Accession #:    [id number]      Weight:       200.0 lb Date of Birth:  Oct 30, 1960        BSA:          2.022 m Patient Age:    66 years        BP:           175/70 mmHg Patient Gender: M               HR:           61 bpm. Exam Location:  Inpatient  Procedure: 2D Echo, Color Doppler and Cardiac Doppler  Indications:    I48.91* Unspecified atrial fibrillation  History:        Patient has prior history of Echocardiogram examinations, most recent 09/08/2020. Risk Factors:Hypertension, Diabetes, Dyslipidemia and Sleep Apnea.  Sonographer:    Raquel Sarna Senior RDCS Referring Phys: [place]   1. Left ventricular ejection fraction, by estimation, is 50 to 55%. The left ventricle has low normal function. The left ventricle has no regional wall motion abnormalities. Left ventricular diastolic parameters are consistent with Grade I diastolic dysfunction (impaired relaxation). 2. Right ventricular systolic function is normal. The right ventricular size is normal. Tricuspid regurgitation signal is inadequate for assessing PA pressure. 3. Left atrial size was mildly dilated. 4. The mitral valve is normal in structure. Trivial mitral valve regurgitation. No evidence of mitral stenosis. 5. The aortic valve is normal in structure. Aortic valve regurgitation is not visualized. Mild aortic valve stenosis. Aortic valve area, by VTI measures 1.69 cm. Aortic valve mean gradient measures 8.0 mmHg. Aortic valve Vmax measures 1.92 m/s. 6. The inferior vena cava is normal in size with greater than 50% respiratory variability, suggesting right atrial pressure of 3 mmHg.  FINDINGS Left Ventricle: Left ventricular ejection fraction, by estimation, is 50 to 55%. The left ventricle has low normal function. The left ventricle has no regional wall motion abnormalities. The left ventricular internal cavity size was normal in  size. There is no left ventricular hypertrophy. Left ventricular diastolic parameters are consistent with Grade I diastolic dysfunction (impaired relaxation).  Right Ventricle: The right ventricular size is normal. No increase in right ventricular wall thickness. Right ventricular systolic function is normal. Tricuspid regurgitation signal is inadequate for  assessing PA pressure.  Left Atrium: Left atrial size was mildly dilated.  Right Atrium: Right atrial size was normal in size.  Pericardium: There is no evidence of pericardial effusion. Presence of epicardial fat layer.  Mitral Valve: The mitral valve is normal in structure. Trivial mitral valve regurgitation. No evidence of mitral valve stenosis.  Tricuspid Valve: The tricuspid valve is normal in structure. Tricuspid valve regurgitation is not demonstrated. No evidence of tricuspid stenosis.  Aortic Valve: The aortic valve is normal in structure. Aortic valve regurgitation is not visualized. Mild aortic stenosis is present. Aortic valve mean gradient measures 8.0 mmHg. Aortic valve peak gradient measures 14.7 mmHg. Aortic valve area, by VTI measures 1.69 cm.  Pulmonic Valve: The pulmonic valve was normal in structure. Pulmonic valve regurgitation is not visualized. No evidence of pulmonic stenosis.  Aorta: The aortic root is normal in size and structure.  Venous: The inferior vena cava is normal in size with greater than 50% respiratory variability, suggesting right atrial pressure of 3 mmHg.  IAS/Shunts: No atrial level shunt detected by color flow Doppler.   LEFT VENTRICLE PLAX 2D LVIDd:         5.70 cm   Diastology LVIDs:         4.10 cm   LV e' medial:    6.42 cm/s LV PW:         1.20 cm   LV E/e' medial:  19.0 LV IVS:        0.90 cm   LV e' lateral:   7.07 cm/s LVOT diam:     2.20 cm   LV E/e' lateral: 17.3 LV SV:         76 LV SV Index:   38 LVOT Area:     3.80 cm   RIGHT VENTRICLE RV S prime:     13.40  cm/s TAPSE (M-mode): 2.4 cm  LEFT ATRIUM             Index        RIGHT ATRIUM           Index LA diam:        4.40 cm 2.18 cm/m   RA Area:     18.30 cm LA Vol (A2C):   72.9 ml 36.03 ml/m  RA Volume:   50.20 ml  24.83 ml/m LA Vol (A4C):   63.6 ml 31.45 ml/m LA Biplane Vol: 69.9 ml 34.57 ml/m AORTIC VALVE AV Area (Vmax):    1.69 cm AV Area (Vmean):   1.89 cm AV Area (VTI):     1.69 cm AV Vmax:           192.00 cm/s AV Vmean:          133.000 cm/s AV VTI:            0.452 m AV Peak Grad:      14.7 mmHg AV Mean Grad:      8.0 mmHg LVOT Vmax:         85.40 cm/s LVOT Vmean:        66.000 cm/s LVOT VTI:          0.201 m LVOT/AV VTI ratio: 0.44  AORTA Ao Root diam: 3.10 cm Ao Asc diam:  3.50 cm  MITRAL VALVE MV Area (PHT): 3.61 cm     SHUNTS MV Decel Time: 210 msec     Systemic VTI:  0.20 m MV E velocity: 122.00 cm/s  Systemic Diam: 2.20 cm MV A velocity: 95.40 cm/s  MV E/A ratio:  1.28  Kardie Tobb DO Electronically signed by Berniece Salines DO Signature Date/Time: 11/16/2021/4:05:41 PM    Final             EKG:  EKG is not ordered today.    Recent Labs: 11/15/2021: Magnesium 2.3 11/16/2021: TSH 2.245 03/06/2022: ALT 10; BUN 39; Creatinine 9.46; Potassium 4.1; Sodium 141 03/27/2022: Hemoglobin 10.4; Platelets 67  Recent Lipid Panel    Component Value Date/Time   CHOL 91 05/25/2021 0814   CHOL 195 06/29/2015 1533   TRIG 95 05/25/2021 0814   HDL 31 (L) 05/25/2021 0814   HDL 17 (L) 06/29/2015 1533   CHOLHDL 2.9 05/25/2021 0814   VLDL 32 01/28/2018 0242   LDLCALC 42 05/25/2021 0814   Home Medications   Current Meds  Medication Sig   acetaminophen (TYLENOL) 500 MG tablet Take 1 tablet (500 mg total) by mouth every 6 (six) hours as needed for moderate pain.   amLODipine (NORVASC) 10 MG tablet Take 1 tablet (10 mg total) by mouth every evening.   aspirin 81 MG chewable tablet Chew 81 mg by mouth daily.   budesonide-formoterol (SYMBICORT) 160-4.5 MCG/ACT  inhaler Inhale 2 puffs into the lungs as needed.   calcitRIOL (ROCALTROL) 0.5 MCG capsule Take 2 capsules (1 mcg total) by mouth every Monday, Wednesday, and Friday with hemodialysis.   Cholecalciferol (VITAMIN D3) 25 MCG (1000 UT) CAPS Take 4,000 Units by mouth daily.   cinacalcet (SENSIPAR) 30 MG tablet Take 2 tablets (60 mg total) by mouth 3 (three) times a week.   cloNIDine (CATAPRES) 0.2 MG tablet TAKE 1 TABLET (0.2 MG TOTAL) BY MOUTH EVERY 6 (SIX) HOURS AS NEEDED (SBP >170 OR DBP>100).   diphenhydrAMINE (BENADRYL) 25 mg capsule Take 25 mg by mouth every 6 (six) hours as needed for allergies.   DULoxetine (CYMBALTA) 30 MG capsule TAKE 1 CAPSULE BY MOUTH EVERY DAY   FOSRENOL 1000 MG PACK Take 1 packet by mouth 3 (three) times daily.   guaiFENesin (MUCINEX) 600 MG 12 hr tablet Take 1,200 mg by mouth 2 (two) times daily.   hydrALAZINE (APRESOLINE) 100 MG tablet Take 1 tablet (100 mg total) by mouth 3 (three) times daily.   levETIRAcetam (KEPPRA) 250 MG tablet TAKE 1 TABLET (250 MG TOTAL) BY MOUTH EVERY MONDAY, WEDNESDAY, AND FRIDAY WITH HEMODIALYSIS.   levETIRAcetam (KEPPRA) 750 MG tablet Take 1 tablet (750 mg total) by mouth daily.   methocarbamol (ROBAXIN) 750 MG tablet Take 1 tablet (750 mg total) by mouth 4 (four) times daily.   metoprolol tartrate (LOPRESSOR) 100 MG tablet Take 1 tablet (100 mg total) by mouth 2 (two) times daily.   multivitamin (RENA-VIT) TABS tablet Take 1 tablet by mouth daily.   ondansetron (ZOFRAN-ODT) 4 MG disintegrating tablet TAKE 1 TABLET BY MOUTH EVERY 8 HOURS AS NEEDED FOR NAUSEA AND VOMITING   oxyCODONE (OXY IR/ROXICODONE) 5 MG immediate release tablet Take 1-2 tablets (5-10 mg total) by mouth every 4 (four) hours as needed for moderate pain.   pantoprazole (PROTONIX) 40 MG tablet Take 1 tablet (40 mg total) by mouth 2 (two) times daily before a meal.   pravastatin (PRAVACHOL) 40 MG tablet TAKE 1 TABLET BY MOUTH EVERY DAY IN THE EVENING   sevelamer carbonate  (RENVELA) 800 MG tablet Take 2 tablets (1,600 mg total) by mouth as needed (snacks).   sevelamer carbonate (RENVELA) 800 MG tablet Take 4 tablets (3,200 mg total) by mouth 3 (three) times daily with meals.  sucralfate (CARAFATE) 1 GM/10ML suspension Take 10 mLs (1 g total) by mouth 4 (four) times daily -  with meals and at bedtime.   traZODone (DESYREL) 50 MG tablet Take 1 tablet (50 mg total) by mouth at bedtime.   valsartan (DIOVAN) 160 MG tablet Take 1 tablet (160 mg total) by mouth daily.   [DISCONTINUED] doxazosin (CARDURA) 4 MG tablet TAKE 1 TABLET BY MOUTH EVERY DAY   [DISCONTINUED] isosorbide mononitrate (IMDUR) 60 MG 24 hr tablet Take 1 tablet (60 mg total) by mouth in the morning and at bedtime.   [DISCONTINUED] losartan (COZAAR) 100 MG tablet Take 1 tablet (100 mg total) by mouth every evening.     Review of Systems      All other systems reviewed and are otherwise negative except as noted above.  Physical Exam    VS:  BP (!) 180/76   Pulse (!) 53   Ht 5\' 7"  (1.702 m)   Wt 192 lb (87.1 kg)   BMI 30.07 kg/m  , BMI Body mass index is 30.07 kg/m.  Wt Readings from Last 3 Encounters:  04/24/22 192 lb (87.1 kg)  04/12/22 198 lb 9.6 oz (90.1 kg)  03/29/22 194 lb 12.8 oz (88.4 kg)    GEN: Well nourished, well developed, in no acute distress. HEENT: normal. Neck: Supple, no JVD, carotid bruits, or masses. Cardiac: RRR, no murmurs, rubs, or gallops. No clubbing, cyanosis, edema.  Radials/PT 2+ and equal bilaterally.  Respiratory:  Respirations regular and unlabored, clear to auscultation bilaterally. GI: Soft, nontender, nondistended. MS: No deformity or atrophy. Skin: Warm and dry, no rash. Neuro:  Strength and sensation are intact. Psych: Normal affect.  Assessment & Plan    HTN - BP has improved with home systolic readings as low as 138 since Imdur increased to twice daily but notes more headache.  Reduce Imdur to 60 mg daily.  Increase doxazosin to 8 mg nightly.  Stop  losartan and start Valsartan 160 mg daily. Could further increase Valsartan or Doxazosin at follow up. Continue clonidine PRN for SBP >180. Hopeful with improved control of BP will not need as often as notes fatigue. Continue Amlodipine 10mg  QD, Hydralazine 100mg  TID. Discussed to monitor BP at home at least 2 hours after medications and sitting for 5-10 minutes. Not renal denervation candidate due to renal function.   Subdural hemorrhage - Admission 10/2021. Follows with neurology. Headache may be related, if does not improve with reduced dose Imdur could increase dose again.  ESRD on HD - s/p failed renal transplant. volume management per HD.  Continue to follow with nephrology.  NICM -with recovered LVEF.  Most recent echo 11/16/21 normal LVEF 50-55%. No signs of worsening heart failure.  Volume management per hemodialysis. BP control, as above.  Mild aortic stenosis - Most recent echo 11/16/21 mild AS with mean gradient 36mmHg. Continue optimal BP control.  Disposition: Follow up in 3-5 week(s) with Dr. Debara [name] or APP.  Signed, Loel Dubonnet, NP 04/24/2022, 10:01 AM [place]

## 2022-04-24 NOTE — Patient Instructions (Addendum)
Medication Instructions:  Your physician has recommended you make the following change in your medication:   REDUCE Isosorbide Mononitrate (Imdur) to 60mg  ONCE daily  INCREASE Doxazosin to 8mg  daily Take two of your 4mg  tablets together- let us know when you need refills   STOP Losartan  START Valsartan 160mg  daily   Recommend trying generic version of Zyrtec or Claritin for allergy symptoms as it will likely last longer than the Benadryl.  *If you need a refill on your cardiac medications before your next appointment, please call your pharmacy*  Follow-Up: At [facility], you and your health needs are our priority.  As part of our continuing mission to provide you with exceptional heart care, we have created designated Provider Care Teams.  These Care Teams include your primary Cardiologist (physician) and Advanced Practice Providers (APPs -  Physician Assistants and Nurse Practitioners) who all work together to provide you with the care you need, when you need it.  We recommend signing up for the patient portal called "MyChart".  Sign up information is provided on this After Visit Summary.  MyChart is used to connect with patients for Virtual Visits (Telemedicine).  Patients are able to view lab/test results, encounter notes, upcoming appointments, etc.  Non-urgent messages can be sent to your provider as well.   To learn more about what you can do with MyChart, go to [email or web address].    Your next appointment:   3-5 week(s)  Provider:   K. Mali Hilty, MD or Laurann Montana, NP    Other Instructions  Tips to Measure your Blood Pressure Correctly  Here's what you can do to ensure a correct reading:  Don't drink a caffeinated beverage or smoke during the 30 minutes before the test.  Sit quietly for five minutes before the test begins.  During the measurement, sit in a chair with your feet on the floor and your arm supported so your elbow is at about heart level.   The inflatable part of the cuff should completely cover at least 80% of your upper arm, and the cuff should be placed on bare skin, not over a shirt.  Don't talk during the measurement.   Blood pressure categories  Blood pressure category SYSTOLIC (upper number)  DIASTOLIC (lower number)  Normal Less than 120 mm Hg and Less than 80 mm Hg  Elevated 120-129 mm Hg and Less than 80 mm Hg  High blood pressure: Stage 1 hypertension 130-139 mm Hg or 80-89 mm Hg  High blood pressure: Stage 2 hypertension 140 mm Hg or higher or 90 mm Hg or higher  Hypertensive crisis (consult your doctor immediately) Higher than 180 mm Hg and/or Higher than 120 mm Hg  Source: American Heart Association and American Stroke Association. For more on getting your blood pressure under control, buy Controlling Your Blood Pressure, a Special Health Report from [facility].   Blood Pressure Log   Date   Time  Blood Pressure  Example: Nov 1 9 AM 124/78

## 2022-04-25 DIAGNOSIS — D509 Iron deficiency anemia, unspecified: Secondary | ICD-10-CM | POA: Diagnosis not present

## 2022-04-25 DIAGNOSIS — Z992 Dependence on renal dialysis: Secondary | ICD-10-CM | POA: Diagnosis not present

## 2022-04-25 DIAGNOSIS — N186 End stage renal disease: Secondary | ICD-10-CM | POA: Diagnosis not present

## 2022-04-25 DIAGNOSIS — N2581 Secondary hyperparathyroidism of renal origin: Secondary | ICD-10-CM | POA: Diagnosis not present

## 2022-04-25 DIAGNOSIS — D631 Anemia in chronic kidney disease: Secondary | ICD-10-CM | POA: Diagnosis not present

## 2022-04-26 ENCOUNTER — Telehealth: Payer: Self-pay | Admitting: Hematology and Oncology

## 2022-04-26 NOTE — Telephone Encounter (Signed)
Scheduled appointments per staff message. Left voicemail. 

## 2022-04-27 ENCOUNTER — Telehealth: Payer: Self-pay | Admitting: *Deleted

## 2022-04-27 DIAGNOSIS — D631 Anemia in chronic kidney disease: Secondary | ICD-10-CM | POA: Diagnosis not present

## 2022-04-27 DIAGNOSIS — N2581 Secondary hyperparathyroidism of renal origin: Secondary | ICD-10-CM | POA: Diagnosis not present

## 2022-04-27 DIAGNOSIS — Z992 Dependence on renal dialysis: Secondary | ICD-10-CM | POA: Diagnosis not present

## 2022-04-27 DIAGNOSIS — D509 Iron deficiency anemia, unspecified: Secondary | ICD-10-CM | POA: Diagnosis not present

## 2022-04-27 DIAGNOSIS — N186 End stage renal disease: Secondary | ICD-10-CM | POA: Diagnosis not present

## 2022-04-27 NOTE — Progress Notes (Signed)
  Care Coordination Note  04/27/2022 Name: Kirk [name] MRN: [id number] DOB: Oct 13, 1960  Kirk [name] is a 62 y.o. year old male who is a primary care patient of Ngetich, Dinah C, NP and is actively engaged with the care management team. I reached out to Kirk [name] by phone today to assist with re-scheduling a follow up visit with the RN Case Manager  Follow up plan: Unsuccessful telephone outreach attempt made. A HIPAA compliant phone message was left for the patient providing contact information and requesting a return call.   [facility]  Care Coordination Care Guide  Direct Dial: [phone]

## 2022-04-28 ENCOUNTER — Other Ambulatory Visit: Payer: Self-pay

## 2022-04-28 ENCOUNTER — Emergency Department (HOSPITAL_COMMUNITY): Payer: Medicare Other

## 2022-04-28 ENCOUNTER — Inpatient Hospital Stay (HOSPITAL_COMMUNITY)
Admission: EM | Admit: 2022-04-28 | Discharge: 2022-05-09 | DRG: 304 | Disposition: A | Discharge status: Home / Self Care | Payer: Medicare Other | Attending: Internal Medicine | Admitting: Internal Medicine

## 2022-04-28 ENCOUNTER — Encounter (HOSPITAL_COMMUNITY): Payer: Self-pay | Admitting: *Deleted

## 2022-04-28 DIAGNOSIS — K219 Gastro-esophageal reflux disease without esophagitis: Secondary | ICD-10-CM | POA: Diagnosis present

## 2022-04-28 DIAGNOSIS — R519 Headache, unspecified: Secondary | ICD-10-CM | POA: Diagnosis present

## 2022-04-28 DIAGNOSIS — N186 End stage renal disease: Secondary | ICD-10-CM | POA: Diagnosis present

## 2022-04-28 DIAGNOSIS — G40209 Localization-related (focal) (partial) symptomatic epilepsy and epileptic syndromes with complex partial seizures, not intractable, without status epilepticus: Secondary | ICD-10-CM | POA: Diagnosis present

## 2022-04-28 DIAGNOSIS — B348 Other viral infections of unspecified site: Secondary | ICD-10-CM | POA: Diagnosis not present

## 2022-04-28 DIAGNOSIS — J9601 Acute respiratory failure with hypoxia: Secondary | ICD-10-CM | POA: Diagnosis present

## 2022-04-28 DIAGNOSIS — Z9049 Acquired absence of other specified parts of digestive tract: Secondary | ICD-10-CM

## 2022-04-28 DIAGNOSIS — G609 Hereditary and idiopathic neuropathy, unspecified: Secondary | ICD-10-CM | POA: Diagnosis present

## 2022-04-28 DIAGNOSIS — Z885 Allergy status to narcotic agent status: Secondary | ICD-10-CM

## 2022-04-28 DIAGNOSIS — R918 Other nonspecific abnormal finding of lung field: Secondary | ICD-10-CM | POA: Diagnosis not present

## 2022-04-28 DIAGNOSIS — G4700 Insomnia, unspecified: Secondary | ICD-10-CM | POA: Diagnosis present

## 2022-04-28 DIAGNOSIS — Z6830 Body mass index (BMI) 30.0-30.9, adult: Secondary | ICD-10-CM

## 2022-04-28 DIAGNOSIS — T17890A Other foreign object in other parts of respiratory tract causing asphyxiation, initial encounter: Secondary | ICD-10-CM | POA: Diagnosis present

## 2022-04-28 DIAGNOSIS — J206 Acute bronchitis due to rhinovirus: Secondary | ICD-10-CM | POA: Diagnosis not present

## 2022-04-28 DIAGNOSIS — N400 Enlarged prostate without lower urinary tract symptoms: Secondary | ICD-10-CM | POA: Diagnosis present

## 2022-04-28 DIAGNOSIS — I160 Hypertensive urgency: Secondary | ICD-10-CM | POA: Diagnosis not present

## 2022-04-28 DIAGNOSIS — M109 Gout, unspecified: Secondary | ICD-10-CM | POA: Diagnosis present

## 2022-04-28 DIAGNOSIS — G4731 Primary central sleep apnea: Secondary | ICD-10-CM

## 2022-04-28 DIAGNOSIS — Z992 Dependence on renal dialysis: Secondary | ICD-10-CM

## 2022-04-28 DIAGNOSIS — W44F9XA Other object of natural or organic material, entering into or through a natural orifice, initial encounter: Secondary | ICD-10-CM | POA: Diagnosis present

## 2022-04-28 DIAGNOSIS — N2581 Secondary hyperparathyroidism of renal origin: Secondary | ICD-10-CM | POA: Diagnosis present

## 2022-04-28 DIAGNOSIS — J1289 Other viral pneumonia: Secondary | ICD-10-CM | POA: Diagnosis present

## 2022-04-28 DIAGNOSIS — M898X9 Other specified disorders of bone, unspecified site: Secondary | ICD-10-CM | POA: Diagnosis present

## 2022-04-28 DIAGNOSIS — J189 Pneumonia, unspecified organism: Secondary | ICD-10-CM | POA: Diagnosis not present

## 2022-04-28 DIAGNOSIS — E785 Hyperlipidemia, unspecified: Secondary | ICD-10-CM | POA: Diagnosis present

## 2022-04-28 DIAGNOSIS — J181 Lobar pneumonia, unspecified organism: Secondary | ICD-10-CM | POA: Diagnosis not present

## 2022-04-28 DIAGNOSIS — R0902 Hypoxemia: Secondary | ICD-10-CM | POA: Diagnosis not present

## 2022-04-28 DIAGNOSIS — N250 Renal osteodystrophy: Secondary | ICD-10-CM | POA: Diagnosis not present

## 2022-04-28 DIAGNOSIS — I161 Hypertensive emergency: Secondary | ICD-10-CM | POA: Diagnosis present

## 2022-04-28 DIAGNOSIS — Z9852 Vasectomy status: Secondary | ICD-10-CM

## 2022-04-28 DIAGNOSIS — E669 Obesity, unspecified: Secondary | ICD-10-CM | POA: Diagnosis present

## 2022-04-28 DIAGNOSIS — Z1152 Encounter for screening for COVID-19: Secondary | ICD-10-CM | POA: Diagnosis not present

## 2022-04-28 DIAGNOSIS — T8612 Kidney transplant failure: Secondary | ICD-10-CM | POA: Diagnosis present

## 2022-04-28 DIAGNOSIS — J849 Interstitial pulmonary disease, unspecified: Secondary | ICD-10-CM | POA: Diagnosis present

## 2022-04-28 DIAGNOSIS — D696 Thrombocytopenia, unspecified: Secondary | ICD-10-CM | POA: Diagnosis present

## 2022-04-28 DIAGNOSIS — I169 Hypertensive crisis, unspecified: Secondary | ICD-10-CM

## 2022-04-28 DIAGNOSIS — Z79899 Other long term (current) drug therapy: Secondary | ICD-10-CM

## 2022-04-28 DIAGNOSIS — Z905 Acquired absence of kidney: Secondary | ICD-10-CM

## 2022-04-28 DIAGNOSIS — Z7982 Long term (current) use of aspirin: Secondary | ICD-10-CM

## 2022-04-28 DIAGNOSIS — D631 Anemia in chronic kidney disease: Secondary | ICD-10-CM | POA: Diagnosis not present

## 2022-04-28 DIAGNOSIS — R4182 Altered mental status, unspecified: Secondary | ICD-10-CM | POA: Diagnosis not present

## 2022-04-28 DIAGNOSIS — F419 Anxiety disorder, unspecified: Secondary | ICD-10-CM | POA: Diagnosis present

## 2022-04-28 DIAGNOSIS — J45909 Unspecified asthma, uncomplicated: Secondary | ICD-10-CM | POA: Diagnosis present

## 2022-04-28 DIAGNOSIS — G4733 Obstructive sleep apnea (adult) (pediatric): Secondary | ICD-10-CM | POA: Diagnosis present

## 2022-04-28 DIAGNOSIS — B9789 Other viral agents as the cause of diseases classified elsewhere: Secondary | ICD-10-CM | POA: Diagnosis present

## 2022-04-28 DIAGNOSIS — R569 Unspecified convulsions: Secondary | ICD-10-CM

## 2022-04-28 DIAGNOSIS — Z7951 Long term (current) use of inhaled steroids: Secondary | ICD-10-CM

## 2022-04-28 DIAGNOSIS — Z87891 Personal history of nicotine dependence: Secondary | ICD-10-CM

## 2022-04-28 DIAGNOSIS — Y830 Surgical operation with transplant of whole organ as the cause of abnormal reaction of the patient, or of later complication, without mention of misadventure at the time of the procedure: Secondary | ICD-10-CM | POA: Diagnosis present

## 2022-04-28 DIAGNOSIS — I132 Hypertensive heart and chronic kidney disease with heart failure and with stage 5 chronic kidney disease, or end stage renal disease: Secondary | ICD-10-CM | POA: Diagnosis present

## 2022-04-28 LAB — CBC WITH DIFFERENTIAL/PLATELET
Abs Immature Granulocytes: 0.01 10*3/uL (ref 0.00–0.07)
Basophils Absolute: 0.1 10*3/uL (ref 0.0–0.1)
Basophils Relative: 1 %
Eosinophils Absolute: 0 10*3/uL (ref 0.0–0.5)
Eosinophils Relative: 1 %
HCT: 35.3 % — ABNORMAL LOW (ref 39.0–52.0)
Hemoglobin: 11.2 g/dL — ABNORMAL LOW (ref 13.0–17.0)
Immature Granulocytes: 0 %
Lymphocytes Relative: 12 %
Lymphs Abs: 0.7 10*3/uL (ref 0.7–4.0)
MCH: 31 pg (ref 26.0–34.0)
MCHC: 31.7 g/dL (ref 30.0–36.0)
MCV: 97.8 fL (ref 80.0–100.0)
Monocytes Absolute: 0.4 10*3/uL (ref 0.1–1.0)
Monocytes Relative: 8 %
Neutro Abs: 4.2 10*3/uL (ref 1.7–7.7)
Neutrophils Relative %: 78 %
Platelets: 60 10*3/uL — ABNORMAL LOW (ref 150–400)
RBC: 3.61 MIL/uL — ABNORMAL LOW (ref 4.22–5.81)
RDW: 15.7 % — ABNORMAL HIGH (ref 11.5–15.5)
WBC: 5.4 10*3/uL (ref 4.0–10.5)
nRBC: 0 % (ref 0.0–0.2)

## 2022-04-28 LAB — RESP PANEL BY RT-PCR (RSV, FLU A&B, COVID)  RVPGX2
Influenza A by PCR: NEGATIVE
Influenza B by PCR: NEGATIVE
Resp Syncytial Virus by PCR: NEGATIVE
SARS Coronavirus 2 by RT PCR: NEGATIVE

## 2022-04-28 LAB — COMPREHENSIVE METABOLIC PANEL
ALT: 13 U/L (ref 0–44)
AST: 17 U/L (ref 15–41)
Albumin: 3.8 g/dL (ref 3.5–5.0)
Alkaline Phosphatase: 57 U/L (ref 38–126)
Anion gap: 16 — ABNORMAL HIGH (ref 5–15)
BUN: 12 mg/dL (ref 6–20)
CO2: 30 mmol/L (ref 22–32)
Calcium: 9.1 mg/dL (ref 8.9–10.3)
Chloride: 96 mmol/L — ABNORMAL LOW (ref 98–111)
Creatinine, Ser: 7.91 mg/dL — ABNORMAL HIGH (ref 0.61–1.24)
GFR, Estimated: 7 mL/min — ABNORMAL LOW (ref >60)
Glucose, Bld: 102 mg/dL — ABNORMAL HIGH (ref 70–99)
Potassium: 4 mmol/L (ref 3.5–5.1)
Sodium: 142 mmol/L (ref 135–145)
Total Bilirubin: 0.9 mg/dL (ref 0.3–1.2)
Total Protein: 6.7 g/dL (ref 6.5–8.1)

## 2022-04-28 LAB — PROTIME-INR
INR: 1.1 (ref 0.8–1.2)
Prothrombin Time: 14.3 seconds (ref 11.4–15.2)

## 2022-04-28 LAB — LACTIC ACID, PLASMA
Lactic Acid, Venous: 1.5 mmol/L (ref 0.5–1.9)
Lactic Acid, Venous: 1.4 mmol/L (ref 0.5–1.9)

## 2022-04-28 MED ORDER — LABETALOL HCL 5 MG/ML IV SOLN
20.0000 mg | Freq: Once | INTRAVENOUS | Status: AC
  Administered 2022-04-28: 20 mg via INTRAVENOUS
  Filled 2022-04-28: qty 4

## 2022-04-28 MED ORDER — HYDRALAZINE HCL 20 MG/ML IJ SOLN
20.0000 mg | Freq: Once | INTRAMUSCULAR | Status: AC
  Administered 2022-04-28: 20 mg via INTRAVENOUS
  Filled 2022-04-28: qty 1

## 2022-04-28 MED ORDER — ALBUTEROL SULFATE (2.5 MG/3ML) 0.083% IN NEBU
INHALATION_SOLUTION | RESPIRATORY_TRACT | Status: AC
  Filled 2022-04-28: qty 6

## 2022-04-28 MED ORDER — CLONIDINE HCL 0.2 MG PO TABS
0.2000 mg | ORAL_TABLET | Freq: Once | ORAL | Status: AC
  Administered 2022-04-28: 0.2 mg via ORAL
  Filled 2022-04-28: qty 1

## 2022-04-28 MED ORDER — IPRATROPIUM BROMIDE 0.02 % IN SOLN
RESPIRATORY_TRACT | Status: AC
  Filled 2022-04-28: qty 2.5

## 2022-04-28 NOTE — Progress Notes (Signed)
Asked to admit this gentleman with hypertensive crisis.   He presents with SOB and headache and found to be hypoxic and severely hypertensive.   SBP 230 despite 20 mg labetalol IV push.   He has clonidine and IV hydralazine ordered. If BP can be adequately controlled with clonidine and IV pushes, he can be admitted by the hospitalists to the progressive unit. If not, would recommend discussing the case with critical care for potential admission to ICU on titratable infusion.

## 2022-04-28 NOTE — ED Triage Notes (Addendum)
Pt c/o cough, sob, headache since last night.   Sob increased today.  Sats of 89% in triage that increased to 96% on 3L.  Temp 100.2 in triage.  Complete dialysis since yesterday.  Family states pt's bp has been in the 200's since dialysis yesterday.

## 2022-04-28 NOTE — ED Provider Notes (Signed)
[place] EMERGENCY DEPARTMENT AT [facility] Provider Note   CSN: [id number] Arrival date & time: 04/28/22  1919     History {Add pertinent medical, surgical, social history, OB history to HPI:1} Chief Complaint  Patient presents with   Shortness of Breath    Kirk [name] is a 62 y.o. male.   Shortness of Breath    Patient presents ED with complaints of shortness of breath and headache.  Patient has a history of tension headaches, hypertension GI bleeding chronic kidney disease on dialysis chronic CHF, hypertension, cerebral aneurysm.  Patient states he started having trouble with cough yesterday.  He last went to dialysis yesterday.  He was bringing up some clear mucus.  He has been feeling short of breath.  Today also started developing a headache.  He has been having issues with his blood pressure and they have been trying to adjust his medications but it has been very elevated today.  No known fevers.  Home Medications Prior to Admission medications   Medication Sig Start Date End Date Taking? Authorizing Provider  acetaminophen (TYLENOL) 500 MG tablet Take 1 tablet (500 mg total) by mouth every 6 (six) hours as needed for moderate pain. 09/12/14   Standley Brooking, MD  amLODipine (NORVASC) 10 MG tablet Take 1 tablet (10 mg total) by mouth every evening. 11/29/21   Angiulli, Mcarthur Rossetti, PA-C  aspirin 81 MG chewable tablet Chew 81 mg by mouth daily. 09/28/20   [provider]  budesonide-formoterol (SYMBICORT) 160-4.5 MCG/ACT inhaler Inhale 2 puffs into the lungs as needed. 11/16/20   [provider]  calcitRIOL (ROCALTROL) 0.5 MCG capsule Take 2 capsules (1 mcg total) by mouth every Monday, Wednesday, and Friday with hemodialysis. 06/04/18   Black, Lesle Chris, NP  Cholecalciferol (VITAMIN D3) 25 MCG (1000 UT) CAPS Take 4,000 Units by mouth daily.    [provider]  cinacalcet (SENSIPAR) 30 MG tablet Take 2 tablets (60 mg total) by mouth 3 (three)  times a week. 11/29/21   Angiulli, Mcarthur Rossetti, PA-C  cloNIDine (CATAPRES) 0.2 MG tablet TAKE 1 TABLET (0.2 MG TOTAL) BY MOUTH EVERY 6 (SIX) HOURS AS NEEDED (SBP >170 OR DBP>100). 04/20/22   Alver Sorrow, NP  diphenhydrAMINE (BENADRYL) 25 mg capsule Take 25 mg by mouth every 6 (six) hours as needed for allergies.    [provider]  doxazosin (CARDURA) 8 MG tablet Take 1 tablet (8 mg total) by mouth daily. 04/24/22   Alver Sorrow, NP  DULoxetine (CYMBALTA) 30 MG capsule TAKE 1 CAPSULE BY MOUTH EVERY DAY 03/26/22   Ngetich, Dinah C, NP  FOSRENOL 1000 MG PACK Take 1 packet by mouth 3 (three) times daily. 02/26/22   [provider]  guaiFENesin (MUCINEX) 600 MG 12 hr tablet Take 1,200 mg by mouth 2 (two) times daily.    [provider]  hydrALAZINE (APRESOLINE) 100 MG tablet Take 1 tablet (100 mg total) by mouth 3 (three) times daily. 03/29/22   Ngetich, Dinah C, NP  isosorbide mononitrate (IMDUR) 60 MG 24 hr tablet Take 1 tablet (60 mg total) by mouth daily. 04/24/22   Alver Sorrow, NP  levETIRAcetam (KEPPRA) 250 MG tablet TAKE 1 TABLET (250 MG TOTAL) BY MOUTH EVERY MONDAY, WEDNESDAY, AND FRIDAY WITH HEMODIALYSIS. 03/26/22 06/24/22  Ngetich, Dinah C, NP  levETIRAcetam (KEPPRA) 750 MG tablet Take 1 tablet (750 mg total) by mouth daily. 02/20/22 05/16/23  Windell Norfolk, MD  methocarbamol (ROBAXIN) 750 MG tablet Take  1 tablet (750 mg total) by mouth 4 (four) times daily. 11/29/21   Angiulli, Mcarthur Rossettianiel J, PA-C  metoprolol tartrate (LOPRESSOR) 100 MG tablet Take 1 tablet (100 mg total) by mouth 2 (two) times daily. 01/18/22   Ngetich, Dinah C, NP  multivitamin (RENA-VIT) TABS tablet Take 1 tablet by mouth daily. 11/29/21   Angiulli, Mcarthur Rossettianiel J, PA-C  ondansetron (ZOFRAN-ODT) 4 MG disintegrating tablet TAKE 1 TABLET BY MOUTH EVERY 8 HOURS AS NEEDED FOR NAUSEA AND VOMITING 02/28/22   Ngetich, Dinah C, NP  oxyCODONE (OXY IR/ROXICODONE) 5 MG immediate release tablet Take 1-2 tablets (5-10 mg total)  by mouth every 4 (four) hours as needed for moderate pain. 01/18/22   Ngetich, Dinah C, NP  pantoprazole (PROTONIX) 40 MG tablet Take 1 tablet (40 mg total) by mouth 2 (two) times daily before a meal. 11/29/21   Angiulli, Mcarthur Rossettianiel J, PA-C  pravastatin (PRAVACHOL) 40 MG tablet TAKE 1 TABLET BY MOUTH EVERY DAY IN THE EVENING 10/19/21   Ngetich, Dinah C, NP  sevelamer carbonate (RENVELA) 800 MG tablet Take 2 tablets (1,600 mg total) by mouth as needed (snacks). 11/29/21   Angiulli, Mcarthur Rossettianiel J, PA-C  sevelamer carbonate (RENVELA) 800 MG tablet Take 4 tablets (3,200 mg total) by mouth 3 (three) times daily with meals. 11/29/21   Angiulli, Mcarthur Rossettianiel J, PA-C  sucralfate (CARAFATE) 1 GM/10ML suspension Take 10 mLs (1 g total) by mouth 4 (four) times daily -  with meals and at bedtime. 11/29/21   Angiulli, Mcarthur Rossettianiel J, PA-C  traZODone (DESYREL) 50 MG tablet Take 1 tablet (50 mg total) by mouth at bedtime. 11/29/21   Angiulli, Mcarthur Rossettianiel J, PA-C  valsartan (DIOVAN) 160 MG tablet Take 1 tablet (160 mg total) by mouth daily. 04/24/22   Alver SorrowWalker, Caitlin S, NP      Allergies    Codeine and Hydrocodone-acetaminophen    Review of Systems   Review of Systems  Respiratory:  Positive for shortness of breath.     Physical Exam Updated Vital Signs BP (!) 224/93   Pulse 90   Temp 100.2 F (37.9 C) (Oral)   Resp (!) 21   Ht 1.702 m (5\' 7" )   Wt 87.1 kg   SpO2 100%   BMI 30.07 kg/m  Physical Exam Vitals and nursing note reviewed.  Constitutional:      Appearance: He is well-developed. He is ill-appearing. He is not diaphoretic.  HENT:     Head: Normocephalic and atraumatic.     Comments: Normal tympanic membranes bilaterally    Right Ear: External ear normal.     Left Ear: External ear normal.  Eyes:     General: No scleral icterus.       Right eye: No discharge.        Left eye: No discharge.     Conjunctiva/sclera: Conjunctivae normal.  Neck:     Trachea: No tracheal deviation.  Cardiovascular:     Rate and  Rhythm: Normal rate and regular rhythm.  Pulmonary:     Effort: Pulmonary effort is normal. No respiratory distress.     Breath sounds: Normal breath sounds. No stridor. No decreased breath sounds, wheezing or rales.  Abdominal:     General: Bowel sounds are normal. There is no distension.     Palpations: Abdomen is soft.     Tenderness: There is no abdominal tenderness. There is no guarding or rebound.  Musculoskeletal:        General: No tenderness or deformity.  Cervical back: Neck supple.     Right lower leg: No edema.     Left lower leg: No edema.  Skin:    General: Skin is warm and dry.     Findings: No rash.  Neurological:     General: No focal deficit present.     Mental Status: He is alert.     Cranial Nerves: No cranial nerve deficit, dysarthria or facial asymmetry.     Sensory: No sensory deficit.     Motor: No abnormal muscle tone or seizure activity.     Coordination: Coordination normal.  Psychiatric:        Mood and Affect: Mood normal.     ED Results / Procedures / Treatments   Labs (all labs ordered are listed, but only abnormal results are displayed) Labs Reviewed  COMPREHENSIVE METABOLIC PANEL - Abnormal; Notable for the following components:      Result Value   Chloride 96 (*)    Glucose, Bld 102 (*)    Creatinine, Ser 7.91 (*)    GFR, Estimated 7 (*)    Anion gap 16 (*)    All other components within normal limits  CBC WITH DIFFERENTIAL/PLATELET - Abnormal; Notable for the following components:   RBC 3.61 (*)    Hemoglobin 11.2 (*)    HCT 35.3 (*)    RDW 15.7 (*)    Platelets 60 (*)    All other components within normal limits  CULTURE, BLOOD (ROUTINE X 2)  CULTURE, BLOOD (ROUTINE X 2)  RESP PANEL BY RT-PCR (RSV, FLU A&B, COVID)  RVPGX2  LACTIC ACID, PLASMA  PROTIME-INR  LACTIC ACID, PLASMA    EKG None  Radiology No results found.  Procedures Procedures  {Document cardiac monitor, telemetry assessment procedure when  appropriate:1}  Medications Ordered in ED Medications  labetalol (NORMODYNE) injection 20 mg (has no administration in time range)  albuterol (PROVENTIL) (2.5 MG/3ML) 0.083% nebulizer solution (  Given 04/28/22 2012)  ipratropium (ATROVENT) 0.02 % nebulizer solution (  Given 04/28/22 2012)    ED Course/ Medical Decision Making/ A&P   {   Click here for ABCD2, HEART and other calculatorsREFRESH Note before signing :1}                          Medical Decision Making Amount and/or Complexity of Data Reviewed Labs: ordered. Radiology: ordered.  Risk Prescription drug management.   ***  {Document critical care time when appropriate:1} {Document review of labs and clinical decision tools ie heart score, Chads2Vasc2 etc:1}  {Document your independent review of radiology images, and any outside records:1} {Document your discussion with family members, caretakers, and with consultants:1} {Document social determinants of health affecting pt's care:1} {Document your decision making why or why not admission, treatments were needed:1} Final Clinical Impression(s) / ED Diagnoses Final diagnoses:  None    Rx / DC Orders ED Discharge Orders     None

## 2022-04-29 DIAGNOSIS — J45909 Unspecified asthma, uncomplicated: Secondary | ICD-10-CM | POA: Diagnosis present

## 2022-04-29 DIAGNOSIS — I161 Hypertensive emergency: Secondary | ICD-10-CM | POA: Diagnosis present

## 2022-04-29 DIAGNOSIS — N2581 Secondary hyperparathyroidism of renal origin: Secondary | ICD-10-CM | POA: Diagnosis present

## 2022-04-29 DIAGNOSIS — G4700 Insomnia, unspecified: Secondary | ICD-10-CM | POA: Diagnosis present

## 2022-04-29 DIAGNOSIS — B348 Other viral infections of unspecified site: Secondary | ICD-10-CM | POA: Diagnosis not present

## 2022-04-29 DIAGNOSIS — G609 Hereditary and idiopathic neuropathy, unspecified: Secondary | ICD-10-CM | POA: Diagnosis present

## 2022-04-29 DIAGNOSIS — D631 Anemia in chronic kidney disease: Secondary | ICD-10-CM | POA: Diagnosis present

## 2022-04-29 DIAGNOSIS — R4182 Altered mental status, unspecified: Secondary | ICD-10-CM | POA: Diagnosis not present

## 2022-04-29 DIAGNOSIS — J1289 Other viral pneumonia: Secondary | ICD-10-CM | POA: Diagnosis present

## 2022-04-29 DIAGNOSIS — N186 End stage renal disease: Secondary | ICD-10-CM | POA: Diagnosis present

## 2022-04-29 DIAGNOSIS — D696 Thrombocytopenia, unspecified: Secondary | ICD-10-CM | POA: Diagnosis present

## 2022-04-29 DIAGNOSIS — R569 Unspecified convulsions: Secondary | ICD-10-CM | POA: Diagnosis not present

## 2022-04-29 DIAGNOSIS — Y830 Surgical operation with transplant of whole organ as the cause of abnormal reaction of the patient, or of later complication, without mention of misadventure at the time of the procedure: Secondary | ICD-10-CM | POA: Diagnosis present

## 2022-04-29 DIAGNOSIS — T8612 Kidney transplant failure: Secondary | ICD-10-CM | POA: Diagnosis present

## 2022-04-29 DIAGNOSIS — M898X9 Other specified disorders of bone, unspecified site: Secondary | ICD-10-CM | POA: Diagnosis present

## 2022-04-29 DIAGNOSIS — F419 Anxiety disorder, unspecified: Secondary | ICD-10-CM | POA: Diagnosis present

## 2022-04-29 DIAGNOSIS — Z6830 Body mass index (BMI) 30.0-30.9, adult: Secondary | ICD-10-CM | POA: Diagnosis not present

## 2022-04-29 DIAGNOSIS — Z1152 Encounter for screening for COVID-19: Secondary | ICD-10-CM | POA: Diagnosis not present

## 2022-04-29 DIAGNOSIS — I169 Hypertensive crisis, unspecified: Secondary | ICD-10-CM | POA: Diagnosis present

## 2022-04-29 DIAGNOSIS — W44F9XA Other object of natural or organic material, entering into or through a natural orifice, initial encounter: Secondary | ICD-10-CM | POA: Diagnosis present

## 2022-04-29 DIAGNOSIS — J9601 Acute respiratory failure with hypoxia: Secondary | ICD-10-CM | POA: Diagnosis present

## 2022-04-29 DIAGNOSIS — T17890A Other foreign object in other parts of respiratory tract causing asphyxiation, initial encounter: Secondary | ICD-10-CM | POA: Diagnosis present

## 2022-04-29 DIAGNOSIS — B9789 Other viral agents as the cause of diseases classified elsewhere: Secondary | ICD-10-CM | POA: Diagnosis present

## 2022-04-29 DIAGNOSIS — G40209 Localization-related (focal) (partial) symptomatic epilepsy and epileptic syndromes with complex partial seizures, not intractable, without status epilepticus: Secondary | ICD-10-CM | POA: Diagnosis present

## 2022-04-29 DIAGNOSIS — J189 Pneumonia, unspecified organism: Secondary | ICD-10-CM | POA: Diagnosis not present

## 2022-04-29 DIAGNOSIS — N250 Renal osteodystrophy: Secondary | ICD-10-CM | POA: Diagnosis not present

## 2022-04-29 DIAGNOSIS — M109 Gout, unspecified: Secondary | ICD-10-CM | POA: Diagnosis present

## 2022-04-29 DIAGNOSIS — G4731 Primary central sleep apnea: Secondary | ICD-10-CM | POA: Diagnosis present

## 2022-04-29 DIAGNOSIS — I132 Hypertensive heart and chronic kidney disease with heart failure and with stage 5 chronic kidney disease, or end stage renal disease: Secondary | ICD-10-CM | POA: Diagnosis present

## 2022-04-29 DIAGNOSIS — J181 Lobar pneumonia, unspecified organism: Secondary | ICD-10-CM | POA: Diagnosis not present

## 2022-04-29 DIAGNOSIS — Z992 Dependence on renal dialysis: Secondary | ICD-10-CM | POA: Diagnosis not present

## 2022-04-29 DIAGNOSIS — K219 Gastro-esophageal reflux disease without esophagitis: Secondary | ICD-10-CM | POA: Diagnosis present

## 2022-04-29 DIAGNOSIS — J849 Interstitial pulmonary disease, unspecified: Secondary | ICD-10-CM | POA: Diagnosis present

## 2022-04-29 DIAGNOSIS — R918 Other nonspecific abnormal finding of lung field: Secondary | ICD-10-CM | POA: Diagnosis not present

## 2022-04-29 DIAGNOSIS — I160 Hypertensive urgency: Secondary | ICD-10-CM | POA: Diagnosis present

## 2022-04-29 DIAGNOSIS — J206 Acute bronchitis due to rhinovirus: Secondary | ICD-10-CM | POA: Diagnosis not present

## 2022-04-29 DIAGNOSIS — R0902 Hypoxemia: Secondary | ICD-10-CM | POA: Diagnosis not present

## 2022-04-29 LAB — CBC
HCT: 32.9 % — ABNORMAL LOW (ref 39.0–52.0)
Hemoglobin: 10.5 g/dL — ABNORMAL LOW (ref 13.0–17.0)
MCH: 31.2 pg (ref 26.0–34.0)
MCHC: 31.9 g/dL (ref 30.0–36.0)
MCV: 97.6 fL (ref 80.0–100.0)
Platelets: 54 10*3/uL — ABNORMAL LOW (ref 150–400)
RBC: 3.37 MIL/uL — ABNORMAL LOW (ref 4.22–5.81)
RDW: 16.3 % — ABNORMAL HIGH (ref 11.5–15.5)
WBC: 6.6 10*3/uL (ref 4.0–10.5)
nRBC: 0 % (ref 0.0–0.2)

## 2022-04-29 LAB — RESPIRATORY PANEL BY PCR

## 2022-04-29 LAB — BASIC METABOLIC PANEL
Anion gap: 17 — ABNORMAL HIGH (ref 5–15)
BUN: 16 mg/dL (ref 6–20)
CO2: 29 mmol/L (ref 22–32)
Calcium: 8.7 mg/dL — ABNORMAL LOW (ref 8.9–10.3)
Chloride: 95 mmol/L — ABNORMAL LOW (ref 98–111)
Creatinine, Ser: 8.76 mg/dL — ABNORMAL HIGH (ref 0.61–1.24)
GFR, Estimated: 6 mL/min — ABNORMAL LOW (ref >60)
Glucose, Bld: 104 mg/dL — ABNORMAL HIGH (ref 70–99)
Potassium: 3.9 mmol/L (ref 3.5–5.1)
Sodium: 141 mmol/L (ref 135–145)

## 2022-04-29 LAB — PHOSPHORUS: Phosphorus: 4.7 mg/dL — ABNORMAL HIGH (ref 2.5–4.6)

## 2022-04-29 LAB — CULTURE, BLOOD (ROUTINE X 2): Culture: NO GROWTH

## 2022-04-29 LAB — HEPATITIS B SURFACE ANTIGEN: Hepatitis B Surface Ag: NONREACTIVE

## 2022-04-29 LAB — PROCALCITONIN: Procalcitonin: 2.36 ng/mL

## 2022-04-29 LAB — MAGNESIUM: Magnesium: 2 mg/dL (ref 1.7–2.4)

## 2022-04-29 LAB — HIV ANTIBODY (ROUTINE TESTING W REFLEX): HIV Screen 4th Generation wRfx: NONREACTIVE

## 2022-04-29 LAB — D-DIMER, QUANTITATIVE: D-Dimer, Quant: 0.46 ug/mL-FEU (ref 0.00–0.50)

## 2022-04-29 MED ORDER — SODIUM CHLORIDE 0.9% FLUSH: 3.0000 mL | INTRAVENOUS | Status: DC | PRN

## 2022-04-29 MED ORDER — IRBESARTAN 150 MG PO TABS
150.0000 mg | ORAL_TABLET | Freq: Every day | ORAL | Status: DC
  Administered 2022-04-29 – 2022-05-09 (×10): 150 mg via ORAL
  Filled 2022-04-29 (×11): qty 1

## 2022-04-29 MED ORDER — GUAIFENESIN-DM 100-10 MG/5ML PO SYRP
5.0000 mL | ORAL_SOLUTION | ORAL | Status: DC | PRN
  Administered 2022-04-29: 5 mL via ORAL
  Filled 2022-04-29: qty 5

## 2022-04-29 MED ORDER — PRAVASTATIN SODIUM 40 MG PO TABS
40.0000 mg | ORAL_TABLET | Freq: Every day | ORAL | Status: DC
  Administered 2022-04-29 – 2022-05-08 (×10): 40 mg via ORAL
  Filled 2022-04-29 (×11): qty 1

## 2022-04-29 MED ORDER — ISOSORBIDE MONONITRATE ER 60 MG PO TB24
60.0000 mg | ORAL_TABLET | Freq: Every day | ORAL | Status: DC
  Administered 2022-04-29 – 2022-05-01 (×3): 60 mg via ORAL
  Filled 2022-04-29 (×3): qty 1

## 2022-04-29 MED ORDER — HYDRALAZINE HCL 20 MG/ML IJ SOLN
20.0000 mg | INTRAMUSCULAR | Status: DC | PRN
  Administered 2022-04-29 – 2022-05-06 (×2): 20 mg via INTRAVENOUS
  Filled 2022-04-29 (×2): qty 1

## 2022-04-29 MED ORDER — SODIUM CHLORIDE 0.9% FLUSH
3.0000 mL | Freq: Two times a day (BID) | INTRAVENOUS | Status: DC
  Administered 2022-04-29 – 2022-05-08 (×16): 3 mL via INTRAVENOUS

## 2022-04-29 MED ORDER — HYDRALAZINE HCL 50 MG PO TABS
100.0000 mg | ORAL_TABLET | Freq: Three times a day (TID) | ORAL | Status: DC
  Administered 2022-04-29 – 2022-05-09 (×26): 100 mg via ORAL
  Filled 2022-04-29 (×28): qty 2

## 2022-04-29 MED ORDER — BENZONATATE 100 MG PO CAPS
200.0000 mg | ORAL_CAPSULE | Freq: Three times a day (TID) | ORAL | Status: DC | PRN
  Administered 2022-05-02 – 2022-05-05 (×4): 200 mg via ORAL
  Filled 2022-04-29 (×4): qty 2

## 2022-04-29 MED ORDER — METOPROLOL TARTRATE 100 MG PO TABS
100.0000 mg | ORAL_TABLET | Freq: Two times a day (BID) | ORAL | Status: DC
  Administered 2022-04-29 – 2022-05-09 (×22): 100 mg via ORAL
  Filled 2022-04-29 (×22): qty 1

## 2022-04-29 MED ORDER — AMLODIPINE BESYLATE 10 MG PO TABS
10.0000 mg | ORAL_TABLET | Freq: Every evening | ORAL | Status: DC
  Administered 2022-04-29 – 2022-05-08 (×10): 10 mg via ORAL
  Filled 2022-04-29 (×11): qty 1

## 2022-04-29 MED ORDER — LANTHANUM CARBONATE 500 MG PO CHEW
1000.0000 mg | CHEWABLE_TABLET | Freq: Three times a day (TID) | ORAL | Status: DC
  Administered 2022-04-29 – 2022-05-09 (×24): 1000 mg via ORAL
  Filled 2022-04-29 (×26): qty 2

## 2022-04-29 MED ORDER — TRAZODONE HCL 50 MG PO TABS
50.0000 mg | ORAL_TABLET | Freq: Every day | ORAL | Status: DC
  Administered 2022-04-29 – 2022-05-08 (×10): 50 mg via ORAL
  Filled 2022-04-29 (×10): qty 1

## 2022-04-29 MED ORDER — DULOXETINE HCL 30 MG PO CPEP
30.0000 mg | ORAL_CAPSULE | Freq: Every day | ORAL | Status: DC
  Administered 2022-04-29 – 2022-05-09 (×11): 30 mg via ORAL
  Filled 2022-04-29 (×11): qty 1

## 2022-04-29 MED ORDER — ONDANSETRON HCL 4 MG/2ML IJ SOLN: 4.0000 mg | Freq: Four times a day (QID) | INTRAMUSCULAR | Status: DC | PRN

## 2022-04-29 MED ORDER — CHLORHEXIDINE GLUCONATE CLOTH 2 % EX PADS: 6.0000 | MEDICATED_PAD | Freq: Every day | CUTANEOUS | Status: DC

## 2022-04-29 MED ORDER — SODIUM CHLORIDE 0.9 % IV SOLN: 250.0000 mL | INTRAVENOUS | Status: DC | PRN

## 2022-04-29 MED ORDER — ALBUTEROL SULFATE (2.5 MG/3ML) 0.083% IN NEBU
2.5000 mg | INHALATION_SOLUTION | RESPIRATORY_TRACT | Status: DC | PRN
  Administered 2022-05-01: 2.5 mg via RESPIRATORY_TRACT
  Filled 2022-04-29: qty 3

## 2022-04-29 MED ORDER — ONDANSETRON HCL 4 MG PO TABS: 4.0000 mg | ORAL_TABLET | Freq: Four times a day (QID) | ORAL | Status: DC | PRN

## 2022-04-29 MED ORDER — GUAIFENESIN-DM 100-10 MG/5ML PO SYRP
10.0000 mL | ORAL_SOLUTION | ORAL | Status: DC | PRN
  Administered 2022-04-29 – 2022-05-01 (×3): 10 mL via ORAL
  Filled 2022-04-29 (×3): qty 10

## 2022-04-29 MED ORDER — PREDNISONE 20 MG PO TABS
60.0000 mg | ORAL_TABLET | Freq: Every day | ORAL | Status: AC
  Administered 2022-04-29 – 2022-05-01 (×2): 60 mg via ORAL
  Filled 2022-04-29 (×2): qty 3

## 2022-04-29 MED ORDER — DOXAZOSIN MESYLATE 8 MG PO TABS
8.0000 mg | ORAL_TABLET | Freq: Every day | ORAL | Status: DC
  Administered 2022-04-29 – 2022-05-09 (×11): 8 mg via ORAL
  Filled 2022-04-29 (×11): qty 1

## 2022-04-29 MED ORDER — ACETAMINOPHEN 325 MG PO TABS
650.0000 mg | ORAL_TABLET | Freq: Four times a day (QID) | ORAL | Status: DC | PRN
  Administered 2022-04-29 – 2022-05-05 (×5): 650 mg via ORAL
  Filled 2022-04-29 (×5): qty 2

## 2022-04-29 MED ORDER — IPRATROPIUM-ALBUTEROL 0.5-2.5 (3) MG/3ML IN SOLN
3.0000 mL | Freq: Four times a day (QID) | RESPIRATORY_TRACT | Status: DC
  Administered 2022-04-29 – 2022-05-02 (×12): 3 mL via RESPIRATORY_TRACT
  Filled 2022-04-29 (×12): qty 3

## 2022-04-29 MED ORDER — LEVETIRACETAM 250 MG PO TABS
250.0000 mg | ORAL_TABLET | ORAL | Status: DC
  Administered 2022-04-30 – 2022-05-09 (×5): 250 mg via ORAL
  Filled 2022-04-29 (×5): qty 1

## 2022-04-29 MED ORDER — SODIUM CHLORIDE 0.9% FLUSH
3.0000 mL | Freq: Two times a day (BID) | INTRAVENOUS | Status: DC
  Administered 2022-04-29 – 2022-05-08 (×9): 3 mL via INTRAVENOUS

## 2022-04-29 MED ORDER — FENTANYL CITRATE PF 50 MCG/ML IJ SOSY: 25.0000 ug | PREFILLED_SYRINGE | INTRAMUSCULAR | Status: DC | PRN

## 2022-04-29 NOTE — H&P (Signed)
History and Physical    AFTAB NESSETH XEN:[id number] DOB: 12-07-1960 DOA: 04/28/2022  PCP: Caesar Bookman, NP   Patient coming from: Home   Chief Complaint: SOB, cough, headache   HPI: Kirk [name] is a 62 y.o. 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  Patient relates that the headache has been present intermittently for years and has been attributed possibly to Imdur and/or chronic SDH.  Headache has been bothering him again last few days and he has also developed a nonproductive cough and shortness of breath.  He has mild nausea associated with this but no vomiting or diarrhea.  He has been struggling with uncontrolled blood pressure and recently had his antihypertensives adjusted by cardiology.  ED Course: Upon arrival to the ED, patient is found to be afebrile initially with oxygen saturation in the upper 80s on room air and severely elevated blood pressures.  EKG demonstrates sinus rhythm, chest x-ray is negative for acute cardiopulmonary disease, and head CT is negative for acute intracranial abnormality.  Labs are notable for normal lactic acid, normal WBC, platelets 60,000, normal potassium, normal serum bicarbonate, and normal BUN.  COVID, influenza, and RSV PCR are negative.  Blood cultures were collected in the ED and the patient was treated with IV labetalol, IV hydralazine, and clonidine.  Review of Systems:  All other systems reviewed and apart from HPI, are negative.  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)    Anxiety    Asthma    Asthma    moderate persistent   Carpal tunnel syndrome    Cellulitis and  abscess of trunk    Cholelithiasis 07/13/2014   Chronic headaches    Debility, unspecified    Dermatophytosis of the body    Dysrhythmia    history of   Edema    End stage renal disease on dialysis    "MWF; [facility]" (10/21/2014)   Essential hypertension, benign    GERD (gastroesophageal reflux disease)    Gout, unspecified    HTN (hypertension)    Hypertrophy of prostate without urinary obstruction and other lower urinary tract symptoms (LUTS)    Hypotension, unspecified    Impotence of organic origin    Insomnia, unspecified    Kidney replaced by transplant    Localization-related (focal) (partial) epilepsy and epileptic syndromes with complex partial seizures, without mention of intractable epilepsy    12-15-19- Wife states he has NEVER had a seizure    Lumbago    Memory loss    OSA on CPAP    Other and unspecified hyperlipidemia    controlled /managed per wife    Other chronic nonalcoholic liver disease    Other malaise and fatigue    Other nonspecific abnormal serum enzyme levels    Pain in joint, lower leg    Pain in joint, upper arm    Pneumonia "several times"   PONV (postoperative nausea and vomiting)    Renal dialysis status(V45.11) 02/05/2010   restarted 01/02/13 ofter renal trransplant failure   Secondary hyperparathyroidism (of renal origin)    Shortness of breath    Sleep apnea    wears cpap  Tension headache    Unspecified constipation    Unspecified essential hypertension    Unspecified hereditary and idiopathic peripheral neuropathy    Unspecified vitamin D deficiency     Past Surgical History:  Procedure Laterality Date   AV FISTULA PLACEMENT Left ?2010   "forearm; at [facility]"   BACK SURGERY     CARDIAC CATHETERIZATION  03/21/2011   CHOLECYSTECTOMY N/A 10/21/2014   Procedure: LAPAROSCOPIC CHOLECYSTECTOMY WITH INTRAOPERATIVE CHOLANGIOGRAM;  Surgeon: Chevis Pretty III, MD;  Location: [facility] OR;  Service: General;   Laterality: N/A;   COLONOSCOPY     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   IR ANGIO INTRA EXTRACRAN SEL INTERNAL CAROTID UNI R MOD SED  11/30/2021   IR ANGIOGRAM FOLLOW UP STUDY  11/30/2021   IR BONE MARROW BIOPSY & ASPIRATION  03/27/2022   IR NEURO EACH ADD'L AFTER BASIC UNI RIGHT (MS)  11/30/2021   IR TRANSCATH/EMBOLIZ  11/30/2021   KIDNEY TRANSPLANT  08/17/2011   [facility]    LAPAROSCOPIC CHOLECYSTECTOMY  10/21/2014   w/IOC   LEFT HEART CATHETERIZATION WITH CORONARY ANGIOGRAM N/A 03/21/2011   Procedure: LEFT HEART CATHETERIZATION WITH CORONARY ANGIOGRAM;  Surgeon: Chrystie Nose, MD;  Location: [facility] CATH LAB;  Service: Cardiovascular;  Laterality: N/A;   NEPHRECTOMY  08/2013   removed transplaned kidney   POLYPECTOMY     POSTERIOR FUSION CERVICAL SPINE  06/25/2012   for spinal stenosis   RADIOLOGY WITH ANESTHESIA N/A 11/30/2021   Procedure: RIGHT MIDDLE MENINGEAL ARTERY EMBOLIZATION;  Surgeon: Radiologist, Medication, MD;  Location: [facility] OR;  Service: Radiology;  Laterality: N/A;   VASECTOMY  2010    Social History:   reports that he quit smoking about 2 years ago. His smoking use included cigarettes. He started smoking about 31 years ago. He has a 16.00 pack-year smoking history. He has never used smokeless tobacco. He reports that he does not drink alcohol and does not use drugs.  Allergies  Allergen Reactions   Codeine Nausea And Vomiting   Hydrocodone-Acetaminophen Nausea And Vomiting    Family History  Adopted: Yes  Problem Relation Age of Onset   Colon cancer Neg Hx    Esophageal cancer Neg Hx    Rectal cancer Neg Hx    Stomach cancer Neg Hx    Colon polyps Neg Hx      Prior to Admission medications   Medication Sig Start Date End Date Taking? Authorizing Provider  acetaminophen (TYLENOL) 500 MG tablet Take 1 tablet (500 mg total) by mouth every 6 (six) hours as needed for moderate pain. 09/12/14   Standley Brooking, MD  amLODipine (NORVASC) 10 MG tablet Take  1 tablet (10 mg total) by mouth every evening. 11/29/21   Angiulli, Mcarthur Rossetti, PA-C  aspirin 81 MG chewable tablet Chew 81 mg by mouth daily. 09/28/20   [provider]  budesonide-formoterol (SYMBICORT) 160-4.5 MCG/ACT inhaler Inhale 2 puffs into the lungs as needed. 11/16/20   [provider]  calcitRIOL (ROCALTROL) 0.5 MCG capsule Take 2 capsules (1 mcg total) by mouth every Monday, Wednesday, and Friday with hemodialysis. 06/04/18   Black, Lesle Chris, NP  Cholecalciferol (VITAMIN D3) 25 MCG (1000 UT) CAPS Take 4,000 Units by mouth daily.    [provider]  cinacalcet (SENSIPAR) 30 MG tablet Take 2 tablets (60 mg total) by mouth 3 (three) times a week. 11/29/21   Angiulli, Mcarthur Rossetti, PA-C  cloNIDine (CATAPRES) 0.2 MG tablet TAKE 1 TABLET (0.2  MG TOTAL) BY MOUTH EVERY 6 (SIX) HOURS AS NEEDED (SBP >170 OR DBP>100). 04/20/22   Alver Sorrow, NP  diphenhydrAMINE (BENADRYL) 25 mg capsule Take 25 mg by mouth every 6 (six) hours as needed for allergies.    [provider]  doxazosin (CARDURA) 8 MG tablet Take 1 tablet (8 mg total) by mouth daily. 04/24/22   Alver Sorrow, NP  DULoxetine (CYMBALTA) 30 MG capsule TAKE 1 CAPSULE BY MOUTH EVERY DAY 03/26/22   Ngetich, Dinah C, NP  FOSRENOL 1000 MG PACK Take 1 packet by mouth 3 (three) times daily. 02/26/22   [provider]  guaiFENesin (MUCINEX) 600 MG 12 hr tablet Take 1,200 mg by mouth 2 (two) times daily.    [provider]  hydrALAZINE (APRESOLINE) 100 MG tablet Take 1 tablet (100 mg total) by mouth 3 (three) times daily. 03/29/22   Ngetich, Dinah C, NP  isosorbide mononitrate (IMDUR) 60 MG 24 hr tablet Take 1 tablet (60 mg total) by mouth daily. 04/24/22   Alver Sorrow, NP  levETIRAcetam (KEPPRA) 250 MG tablet TAKE 1 TABLET (250 MG TOTAL) BY MOUTH EVERY MONDAY, WEDNESDAY, AND FRIDAY WITH HEMODIALYSIS. 03/26/22 06/24/22  Ngetich, Dinah C, NP  levETIRAcetam (KEPPRA) 750 MG tablet Take 1 tablet (750 mg total) by  mouth daily. 02/20/22 05/16/23  Windell Norfolk, MD  methocarbamol (ROBAXIN) 750 MG tablet Take 1 tablet (750 mg total) by mouth 4 (four) times daily. 11/29/21   Angiulli, Mcarthur Rossetti, PA-C  metoprolol tartrate (LOPRESSOR) 100 MG tablet Take 1 tablet (100 mg total) by mouth 2 (two) times daily. 01/18/22   Ngetich, Dinah C, NP  multivitamin (RENA-VIT) TABS tablet Take 1 tablet by mouth daily. 11/29/21   Angiulli, Mcarthur Rossetti, PA-C  ondansetron (ZOFRAN-ODT) 4 MG disintegrating tablet TAKE 1 TABLET BY MOUTH EVERY 8 HOURS AS NEEDED FOR NAUSEA AND VOMITING 02/28/22   Ngetich, Dinah C, NP  oxyCODONE (OXY IR/ROXICODONE) 5 MG immediate release tablet Take 1-2 tablets (5-10 mg total) by mouth every 4 (four) hours as needed for moderate pain. 01/18/22   Ngetich, Dinah C, NP  pantoprazole (PROTONIX) 40 MG tablet Take 1 tablet (40 mg total) by mouth 2 (two) times daily before a meal. 11/29/21   Angiulli, Mcarthur Rossetti, PA-C  pravastatin (PRAVACHOL) 40 MG tablet TAKE 1 TABLET BY MOUTH EVERY DAY IN THE EVENING 10/19/21   Ngetich, Dinah C, NP  sevelamer carbonate (RENVELA) 800 MG tablet Take 2 tablets (1,600 mg total) by mouth as needed (snacks). 11/29/21   Angiulli, Mcarthur Rossetti, PA-C  sevelamer carbonate (RENVELA) 800 MG tablet Take 4 tablets (3,200 mg total) by mouth 3 (three) times daily with meals. 11/29/21   Angiulli, Mcarthur Rossetti, PA-C  sucralfate (CARAFATE) 1 GM/10ML suspension Take 10 mLs (1 g total) by mouth 4 (four) times daily -  with meals and at bedtime. 11/29/21   Angiulli, Mcarthur Rossetti, PA-C  traZODone (DESYREL) 50 MG tablet Take 1 tablet (50 mg total) by mouth at bedtime. 11/29/21   Angiulli, Mcarthur Rossetti, PA-C  valsartan (DIOVAN) 160 MG tablet Take 1 tablet (160 mg total) by mouth daily. 04/24/22   Alver Sorrow, NP    Physical Exam: Vitals:   04/28/22 2348 04/29/22 0030 04/29/22 0045 04/29/22 0139  BP: (!) 199/77 (!) 179/62 (!) 167/65 (!) 183/69  Pulse:  88 89 83  Resp:  (!) 21 (!) 23 20  Temp:    (!) 100.6 F (38.1 C)   TempSrc:    Oral  SpO2:  96% 97% 96%  Weight:    87.1 kg  Height:    5\' 7"  (1.702 m)    Constitutional: NAD, no pallor or diaphoresis  Eyes: PERTLA, lids and conjunctivae normal ENMT: Mucous membranes are moist. Posterior pharynx clear of any exudate or lesions.   Neck: supple, no masses  Respiratory: no wheezing, no crackles. No accessory muscle use.  Cardiovascular: S1 & S2 heard, regular rate and rhythm. No extremity edema.   Abdomen: No distension, no tenderness, soft. Bowel sounds active.  Musculoskeletal: no clubbing / cyanosis. No joint deformity upper and lower extremities.   Skin: no significant rashes, lesions, ulcers. Warm, dry, well-perfused. Neurologic: Gross hearing deficit, CN 2-12 grossly intact otherwise. Moving all extremities. Alert and oriented.  Psychiatric: Calm. Cooperative.    Labs and Imaging on Admission: I have personally reviewed following labs and imaging studies  CBC: Recent Labs  Lab 04/28/22 1955  WBC 5.4  NEUTROABS 4.2  HGB 11.2*  HCT 35.3*  MCV 97.8  PLT 60*   Basic Metabolic Panel: Recent Labs  Lab 04/28/22 1955  NA 142  K 4.0  CL 96*  CO2 30  GLUCOSE 102*  BUN 12  CREATININE 7.91*  CALCIUM 9.1   GFR: Estimated Creatinine Clearance: 10.5 mL/min (A) (by C-G formula based on SCr of 7.91 mg/dL (H)). Liver Function Tests: Recent Labs  Lab 04/28/22 1955  AST 17  ALT 13  ALKPHOS 57  BILITOT 0.9  PROT 6.7  ALBUMIN 3.8   No results for input(s): "LIPASE", "AMYLASE" in the last 168 hours. No results for input(s): "AMMONIA" in the last 168 hours. Coagulation Profile: Recent Labs  Lab 04/28/22 1955  INR 1.1   Cardiac Enzymes: No results for input(s): "CKTOTAL", "CKMB", "CKMBINDEX", "TROPONINI" in the last 168 hours. BNP (last 3 results) No results for input(s): "PROBNP" in the last 8760 hours. HbA1C: No results for input(s): "HGBA1C" in the last 72 hours. CBG: No results for input(s): "GLUCAP" in the last 168  hours. Lipid Profile: No results for input(s): "CHOL", "HDL", "LDLCALC", "TRIG", "CHOLHDL", "LDLDIRECT" in the last 72 hours. Thyroid Function Tests: No results for input(s): "TSH", "T4TOTAL", "FREET4", "T3FREE", "THYROIDAB" in the last 72 hours. Anemia Panel: No results for input(s): "VITAMINB12", "FOLATE", "FERRITIN", "TIBC", "IRON", "RETICCTPCT" in the last 72 hours. Urine analysis:    Component Value Date/Time   COLORURINE YELLOW 02/19/2017 1520   APPEARANCEUR HAZY (A) 02/19/2017 1520   LABSPEC 1.012 02/19/2017 1520   PHURINE 8.0 02/19/2017 1520   GLUCOSEU 50 (A) 02/19/2017 1520   HGBUR MODERATE (A) 02/19/2017 1520   BILIRUBINUR NEGATIVE 02/19/2017 1520   KETONESUR NEGATIVE 02/19/2017 1520   PROTEINUR >=300 (A) 02/19/2017 1520   UROBILINOGEN 0.2 04/06/2014 1030   NITRITE NEGATIVE 02/19/2017 1520   LEUKOCYTESUR MODERATE (A) 02/19/2017 1520   Sepsis Labs: @LABRCNTIP (procalcitonin:4,lacticidven:4) ) Recent Results (from the past 240 hour(s))  Resp panel by RT-PCR (RSV, Flu A&B, Covid) Anterior Nasal Swab     Status: None   Collection Time: 04/28/22  7:42 PM   Specimen: Anterior Nasal Swab  Result Value Ref Range Status   SARS Coronavirus 2 by RT PCR NEGATIVE NEGATIVE Final   Influenza A by PCR NEGATIVE NEGATIVE Final   Influenza B by PCR NEGATIVE NEGATIVE Final    Comment: (NOTE) The Xpert Xpress SARS-CoV-2/FLU/RSV plus assay is intended as an aid in the diagnosis of influenza from Nasopharyngeal swab specimens and should not be used as a sole basis for treatment. Nasal washings and  aspirates are unacceptable for Xpert Xpress SARS-CoV-2/FLU/RSV testing.  Fact Sheet for Patients: BloggerCourse.com  Fact Sheet for Healthcare Providers: SeriousBroker.it  This test is not yet approved or cleared by the [place] FDA and has been authorized for detection and/or diagnosis of SARS-CoV-2 by FDA under an Emergency Use  Authorization (EUA). This EUA will remain in effect (meaning this test can be used) for the duration of the COVID-19 declaration under Section 564(b)(1) of the Act, 21 U.S.C. section 360bbb-3(b)(1), unless the authorization is terminated or revoked.     Resp Syncytial Virus by PCR NEGATIVE NEGATIVE Final    Comment: (NOTE) Fact Sheet for Patients: BloggerCourse.com  Fact Sheet for Healthcare Providers: SeriousBroker.it  This test is not yet approved or cleared by the [place] FDA and has been authorized for detection and/or diagnosis of SARS-CoV-2 by FDA under an Emergency Use Authorization (EUA). This EUA will remain in effect (meaning this test can be used) for the duration of the COVID-19 declaration under Section 564(b)(1) of the Act, 21 U.S.C. section 360bbb-3(b)(1), unless the authorization is terminated or revoked.  Performed at [facility], [address], [place], [place] [address]      Radiological Exams on Admission: CT Head Wo Contrast  Result Date: 04/28/2022 CLINICAL DATA:  New onset headache. EXAM: CT HEAD WITHOUT CONTRAST TECHNIQUE: Contiguous axial images were obtained from the base of the skull through the vertex without intravenous contrast. RADIATION DOSE REDUCTION: This exam was performed according to the departmental dose-optimization program which includes automated exposure control, adjustment of the mA and/or kV according to patient size and/or use of iterative reconstruction technique. COMPARISON:  CT dated 02/05/2022 FINDINGS: Brain: There is mild right frontoparietal convexity dural thickening in the location of a previous subdural hematoma. This was seen on the last CT and again is no more than 2-3 mm in thickness, alternatively could be a small thin chronic subdural hematoma. No recent parenchymal or extra-axial bleed preserved ventral and no cortical based infarct is seen. There is mild atrophy  and small-vessel disease. The ventricles are normal in size and position. Basal cisterns are clear. Vascular: Calcific plaques both siphons both distal vertebral arteries. No hyperdense central vessel. Skull: Negative for fractures or focal lesions. Sinuses/Orbits: No acute findings. There is chronic fluid opacification of the lower right mastoid air cells. Remaining bilateral mastoids are clear. Trace chronic membrane disease maxillary sinuses. Other: None. IMPRESSION: 1. No acute intracranial CT findings or interval changes. 2. Mild right frontoparietal convexity dural thickening in the location of a previous subdural hematoma. This is no more than 2-3 mm in thickness without mass effect, alternatively could be a small chronic subdural hematoma. 3. Atrophy and small-vessel disease. 4. Chronic fluid opacification of the lower right mastoid air cells. Electronically Signed   By: Almira Bar M.D.   On: 04/28/2022 22:53   DG Chest 2 View  Result Date: 04/28/2022 CLINICAL DATA:  Possible sepsis EXAM: CHEST - 2 VIEW COMPARISON:  11/15/2021 FINDINGS: Cardiac shadow is stable. Aortic calcifications are noted. The lungs are well aerated bilaterally. Mild interstitial prominence is again seen in the bases stable from the prior study. No bony abnormality is seen. IMPRESSION: Chronic interstitial prominence in the bases. No acute abnormality is noted. Electronically Signed   By: Alcide Clever M.D.   On: 04/28/2022 21:24    EKG: Independently reviewed. Sinus rhythm.   Assessment/Plan   1. Acute hypoxic respiratory failure  - Saturating upper 80s on rm air - He  is speaking full sentences and does not appear distressed; no acute findings noted on CXR  - Check d-dimer, consult nephrology in am for dialysis, continue supplemental O2 as needed    2. Hypertensive crisis  - Presents with headache and SOB and found to have SBP in 230s - Headache appears chronic and independent of his HTN  - Improved with IV  labetalol, IV hydralazine, and clonidine in ED  - Continue Norvasc, doxazosin, Lopressor, and ARB, use hydralazine as needed    3. ESRD  - Reports completing HD on 04/27/22  - Renally-dose medications, restrict fluids, continue phos binder, consult nephrology in am for maintenance dialysis    4. Hx of seizure  - Continue Keppra    5. OSA  - Continue CPAP qHS    6. Thrombocytopenia  - Platelets 60,000 on admission, similar to prior values  - He had recent bone marrow biopsy and will be following up with hematology soon to discus  - No bleeding, monitor for now    7. Fever - Developed fever shortly after admission  - No leukocytosis or other SIRS criteria present, no acute CXR findings noted, and COVID/influenza/RSV pcr is negative  - Blood cultures collected, will check procalcitonin, extended respiratory virus panel, follow cultures and clinical course    DVT prophylaxis: SCDs  Code Status: Full  Level of Care: Level of care: Progressive Family Communication: Wife at bedside  Disposition Plan:  Patient is from: home  Anticipated d/c is to: Home  Anticipated d/c date is: 04/30/22  Patient currently: Pending improved/stable respiratory status, stable BP  Consults called: none  Admission status: Inpatient     Briscoe Deutscher, MD [facility]  04/29/2022, 2:24 AM

## 2022-04-29 NOTE — Plan of Care (Signed)

## 2022-04-29 NOTE — Progress Notes (Signed)
[facility]                                                                               Elishah Methot, is a 62 y.o. male, DOB - February 14, 1960, [id number] Admit date - 04/28/2022    Outpatient Primary MD for the patient is Ngetich, Dinah C, NP  LOS - 0  days    Brief summary   NAFI HANBY is a 62 y.o. 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  CT is negative for acute intracranial abnormality. Labs are notable for normal lactic acid, normal WBC, platelets 60,000, normal potassium, normal serum bicarbonate, and normal BUN. COVID, influenza, and RSV PCR are negative.  Respiratory panel shows rhinovirus positive.   Assessment & Plan    Assessment and Plan:   Acute respiratory failure with hypoxia secondary to rhino virus infection Currently on 2 lit of [place] oxygen.  D dimer is negative.  Symptomatic management with prednisone and cough medication.  Duo nebs prn.  Lactic acid wnl.  Procalcitonin is 2.36.     Hypertensive crisis:  Restart home meds.  ? Non compliance to meds.    ESRD on HD MWF Nephrology consulted for HD in am.    Chronic thrombocytopenia;  No obvious signs of bleeding.    Anemia of chronic disease:  Monitor.   Headache;  Improving.   Seizures:  On Keppra 250 mg on the days of HD.    Estimated body mass index is 30.09 kg/m as calculated from the following:   Height as of this encounter: 5\' 7"  (1.702 m).   Weight as of this encounter: 87.1 kg.  Code Status: full code.  DVT Prophylaxis:  SCDs Start: 04/29/22 0223   Level of Care: Level of care: Progressive Family Communication: none at bedside.   Disposition Plan:     Remains inpatient appropriate:  persistent symptoms.   Procedures:  None.   Consultants:    Nephrology.   Antimicrobials:   Anti-infectives (From admission, onward)    None        Medications  Scheduled Meds:  amLODipine  10 mg Oral QPM   doxazosin  8 mg Oral Daily   DULoxetine  30 mg Oral Daily   ipratropium-albuterol  3 mL Nebulization Q6H   irbesartan  150 mg Oral Daily   isosorbide mononitrate  60 mg Oral Daily   lanthanum  1,000 mg Oral TID WC   [START ON 04/30/2022] levETIRAcetam  250 mg Oral Q M,W,F-HD   metoprolol tartrate  100 mg Oral BID   pravastatin  40 mg Oral q1800   predniSONE  60 mg Oral QAC breakfast   sodium chloride flush  3 mL Intravenous Q12H   sodium chloride flush  3 mL Intravenous Q12H   traZODone  50 mg Oral QHS   Continuous Infusions:  sodium chloride     PRN Meds:.sodium chloride, acetaminophen, albuterol, benzonatate, fentaNYL (SUBLIMAZE) injection, guaiFENesin-dextromethorphan, hydrALAZINE, ondansetron **OR** ondansetron (ZOFRAN) IV, sodium chloride flush    Subjective:  Dmarkus Mckaughan was seen and examined today.  Patient reports occasional spells in the last 4 weeks where he doesn't remember what happened.  Cough and sob present.  No chest pain.   Objective:   Vitals:   04/29/22 0342 04/29/22 0755 04/29/22 0804 04/29/22 1217  BP: (!) 149/60  (!) 161/66 (!) 165/67  Pulse: 82  67 71  Resp: 20  20 16   Temp: (!) 100.6 F (38.1 C)  99.3 F (37.4 C) 99 F (37.2 C)  TempSrc: Axillary  Oral Oral  SpO2: 91% 97% 95% (!) 89%  Weight:      Height:        Intake/Output Summary (Last 24 hours) at 04/29/2022 1501 Last data filed at 04/29/2022 0700 Gross per 24 hour  Intake 325 ml  Output --  Net 325 ml   Filed Weights   04/28/22 1936 04/29/22 0139  Weight: 87.1 kg 87.1 kg     Exam General: Alert and oriented x 3, NAD Cardiovascular: S1 S2 auscultated, no murmurs, RRR Respiratory: scattered rales and wheezing intermittent.  Gastrointestinal: Soft, nontender, nondistended, + bowel sounds Ext: no pedal edema  bilaterally Neuro: AAOx3, Cr N's II- XII. Strength 5/5 upper and lower extremities bilaterally Skin: No rashes Psych: Normal affect and demeanor, alert and oriented x3    Data Reviewed:  I have personally reviewed following labs and imaging studies   CBC Lab Results  Component Value Date   WBC 6.6 04/29/2022   RBC 3.37 (L) 04/29/2022   HGB 10.5 (L) 04/29/2022   HCT 32.9 (L) 04/29/2022   MCV 97.6 04/29/2022   MCH 31.2 04/29/2022   PLT 54 (L) 04/29/2022   MCHC 31.9 04/29/2022   RDW 16.3 (H) 04/29/2022   LYMPHSABS 0.7 04/28/2022   MONOABS 0.4 04/28/2022   EOSABS 0.0 04/28/2022   BASOSABS 0.1 04/28/2022     Last metabolic panel Lab Results  Component Value Date   NA 141 04/29/2022   K 3.9 04/29/2022   CL 95 (L) 04/29/2022   CO2 29 04/29/2022   BUN 16 04/29/2022   CREATININE 8.76 (H) 04/29/2022   GLUCOSE 104 (H) 04/29/2022   GFRNONAA 6 (L) 04/29/2022   GFRAA 4 (L) 07/15/2019   CALCIUM 8.7 (L) 04/29/2022   PHOS 4.7 (H) 04/29/2022   PROT 6.7 04/28/2022   ALBUMIN 3.8 04/28/2022   LABGLOB 2.6 05/16/2021   AGRATIO 1.5 05/16/2021   BILITOT 0.9 04/28/2022   ALKPHOS 57 04/28/2022   AST 17 04/28/2022   ALT 13 04/28/2022   ANIONGAP 17 (H) 04/29/2022    CBG (last 3)  No results for input(s): "GLUCAP" in the last 72 hours.    Coagulation Profile: Recent Labs  Lab 04/28/22 1955  INR 1.1     Radiology Studies: CT Head Wo Contrast  Result Date: 04/28/2022 CLINICAL DATA:  New onset headache. EXAM: CT HEAD WITHOUT CONTRAST TECHNIQUE: Contiguous axial images were obtained from the base of the skull through the vertex without intravenous contrast. RADIATION DOSE REDUCTION: This exam was performed according to the departmental dose-optimization program which includes automated exposure control, adjustment of the mA and/or kV according to patient size and/or use of iterative reconstruction technique. COMPARISON:  CT dated 02/05/2022 FINDINGS: Brain: There is mild right  frontoparietal convexity dural thickening in the location of a previous subdural hematoma. This was seen on the last CT and again is no more than 2-3 mm in thickness, alternatively could be a small thin chronic subdural hematoma. No recent parenchymal or extra-axial  bleed preserved ventral and no cortical based infarct is seen. There is mild atrophy and small-vessel disease. The ventricles are normal in size and position. Basal cisterns are clear. Vascular: Calcific plaques both siphons both distal vertebral arteries. No hyperdense central vessel. Skull: Negative for fractures or focal lesions. Sinuses/Orbits: No acute findings. There is chronic fluid opacification of the lower right mastoid air cells. Remaining bilateral mastoids are clear. Trace chronic membrane disease maxillary sinuses. Other: None. IMPRESSION: 1. No acute intracranial CT findings or interval changes. 2. Mild right frontoparietal convexity dural thickening in the location of a previous subdural hematoma. This is no more than 2-3 mm in thickness without mass effect, alternatively could be a small chronic subdural hematoma. 3. Atrophy and small-vessel disease. 4. Chronic fluid opacification of the lower right mastoid air cells. Electronically Signed   By: Almira BarKeith  Chesser M.D.   On: 04/28/2022 22:53   DG Chest 2 View  Result Date: 04/28/2022 CLINICAL DATA:  Possible sepsis EXAM: CHEST - 2 VIEW COMPARISON:  11/15/2021 FINDINGS: Cardiac shadow is stable. Aortic calcifications are noted. The lungs are well aerated bilaterally. Mild interstitial prominence is again seen in the bases stable from the prior study. No bony abnormality is seen. IMPRESSION: Chronic interstitial prominence in the bases. No acute abnormality is noted. Electronically Signed   By: Alcide CleverMark  Lukens M.D.   On: 04/28/2022 21:24       Kathlen ModyVijaya Edge Mauger M.D. [facility] 04/29/2022, 3:01 PM  Available via Epic secure chat 7am-7pm After 7 pm, please refer to night coverage  provider listed on amion.

## 2022-04-29 NOTE — ED Notes (Signed)
ED TO INPATIENT HANDOFF REPORT  ED Nurse Name and Phone #: Chrystina Naff   S Name/Age/Gender Kirk [name] 62 y.o. male Room/Bed: 032C/032C  Code Status   Code Status: Prior  Home/SNF/Other Home Patient oriented to: self, place, time, and situation Is this baseline? Yes   Triage Complete: Triage complete  Chief Complaint Hypertensive crisis [I16.9]  Triage Note Pt c/o cough, sob, headache since last night.   Sob increased today.  Sats of 89% in triage that increased to 96% on 3L.  Temp 100.2 in triage.  Complete dialysis since yesterday.  Family states pt's bp has been in the 200's since dialysis yesterday.   Allergies Allergies  Allergen Reactions   Codeine Nausea And Vomiting   Hydrocodone-Acetaminophen Nausea And Vomiting    Level of Care/Admitting Diagnosis ED Disposition     ED Disposition  Admit   Condition  --   Comment  Hospital Area: [facility] [[id number]]  Level of Care: Progressive [102]  Admit to Progressive based on following criteria: CARDIOVASCULAR & THORACIC of moderate stability with acute coronary syndrome symptoms/low risk myocardial infarction/hypertensive urgency/arrhythmias/heart failure potentially compromising stability and stable post cardiovascular intervention patients.  May admit patient to [facility] or [facility] if equivalent level of care is available:: No  Covid Evaluation: Confirmed COVID Negative  Diagnosis: Hypertensive crisis [007622]  Admitting Physician: Briscoe Deutscher [[id number]]  Attending Physician: Briscoe Deutscher [[id number]]  Certification:: I certify this patient will need inpatient services for at least 2 midnights  Estimated Length of Stay: 3          B Medical/Surgery History 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)    Anxiety    Asthma    Asthma     moderate persistent   Carpal tunnel syndrome    Cellulitis and abscess of trunk    Cholelithiasis 07/13/2014   Chronic headaches    Debility, unspecified    Dermatophytosis of the body    Dysrhythmia    history of   Edema    End stage renal disease on dialysis    "MWF; [facility]" (10/21/2014)   Essential hypertension, benign    GERD (gastroesophageal reflux disease)    Gout, unspecified    HTN (hypertension)    Hypertrophy of prostate without urinary obstruction and other lower urinary tract symptoms (LUTS)    Hypotension, unspecified    Impotence of organic origin    Insomnia, unspecified    Kidney replaced by transplant    Localization-related (focal) (partial) epilepsy and epileptic syndromes with complex partial seizures, without mention of intractable epilepsy    12-15-19- Wife states he has NEVER had a seizure    Lumbago    Memory loss    OSA on CPAP    Other and unspecified hyperlipidemia    controlled /managed per wife    Other chronic nonalcoholic liver disease    Other malaise and fatigue    Other nonspecific abnormal serum enzyme levels    Pain in joint, lower leg    Pain in joint, upper arm    Pneumonia "several times"   PONV (postoperative nausea and vomiting)    Renal dialysis status(V45.11) 02/05/2010   restarted 01/02/13 ofter renal trransplant failure   Secondary hyperparathyroidism (of renal origin)    Shortness of breath  Sleep apnea    wears cpap    Tension headache    Unspecified constipation    Unspecified essential hypertension    Unspecified hereditary and idiopathic peripheral neuropathy    Unspecified vitamin D deficiency    Past Surgical History:  Procedure Laterality Date   AV FISTULA PLACEMENT Left ?2010   "forearm; at [facility]"   BACK SURGERY     CARDIAC CATHETERIZATION  03/21/2011   CHOLECYSTECTOMY N/A 10/21/2014   Procedure: LAPAROSCOPIC CHOLECYSTECTOMY WITH INTRAOPERATIVE CHOLANGIOGRAM;   Surgeon: Chevis Pretty III, MD;  Location: [facility] OR;  Service: General;  Laterality: N/A;   COLONOSCOPY     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   IR ANGIO INTRA EXTRACRAN SEL INTERNAL CAROTID UNI R MOD SED  11/30/2021   IR ANGIOGRAM FOLLOW UP STUDY  11/30/2021   IR BONE MARROW BIOPSY & ASPIRATION  03/27/2022   IR NEURO EACH ADD'L AFTER BASIC UNI RIGHT (MS)  11/30/2021   IR TRANSCATH/EMBOLIZ  11/30/2021   KIDNEY TRANSPLANT  08/17/2011   [facility]    LAPAROSCOPIC CHOLECYSTECTOMY  10/21/2014   w/IOC   LEFT HEART CATHETERIZATION WITH CORONARY ANGIOGRAM N/A 03/21/2011   Procedure: LEFT HEART CATHETERIZATION WITH CORONARY ANGIOGRAM;  Surgeon: Chrystie Nose, MD;  Location: [facility] CATH LAB;  Service: Cardiovascular;  Laterality: N/A;   NEPHRECTOMY  08/2013   removed transplaned kidney   POLYPECTOMY     POSTERIOR FUSION CERVICAL SPINE  06/25/2012   for spinal stenosis   RADIOLOGY WITH ANESTHESIA N/A 11/30/2021   Procedure: RIGHT MIDDLE MENINGEAL ARTERY EMBOLIZATION;  Surgeon: Radiologist, Medication, MD;  Location: [facility] OR;  Service: Radiology;  Laterality: N/A;   VASECTOMY  2010     A IV Location/Drains/Wounds Patient Lines/Drains/Airways Status     Active Line/Drains/Airways     Name Placement date Placement time Site Days   Peripheral IV 04/28/22 20 G Anterior;Right Forearm 04/28/22  2002  Forearm  1   Peripheral IV 04/28/22 20 G Anterior;Distal;Right;Upper Arm 04/28/22  2011  Arm  1   Fistula / Graft Left Forearm --  --  Forearm  --   Fistula / Graft Left Forearm Arteriovenous fistula --  --  Forearm  --            Intake/Output Last 24 hours No intake or output data in the 24 hours ending 04/29/22 0034  Labs/Imaging Results for orders placed or performed during the hospital encounter of 04/28/22 (from the past 48 hour(s))  Resp panel by RT-PCR (RSV, Flu A&B, Covid) Anterior Nasal Swab     Status: None   Collection Time: 04/28/22  7:42 PM   Specimen: Anterior Nasal Swab   Result Value Ref Range   SARS Coronavirus 2 by RT PCR NEGATIVE NEGATIVE   Influenza A by PCR NEGATIVE NEGATIVE   Influenza B by PCR NEGATIVE NEGATIVE    Comment: (NOTE) The Xpert Xpress SARS-CoV-2/FLU/RSV plus assay is intended as an aid in the diagnosis of influenza from Nasopharyngeal swab specimens and should not be used as a sole basis for treatment. Nasal washings and aspirates are unacceptable for Xpert Xpress SARS-CoV-2/FLU/RSV testing.  Fact Sheet for Patients: BloggerCourse.com  Fact Sheet for Healthcare Providers: SeriousBroker.it  This test is not yet approved or cleared by the [place] FDA and has been authorized for detection and/or diagnosis of SARS-CoV-2 by FDA under an Emergency Use Authorization (EUA). This EUA will remain in effect (meaning this test can be used) for the duration of  the COVID-19 declaration under Section 564(b)(1) of the Act, 21 U.S.C. section 360bbb-3(b)(1), unless the authorization is terminated or revoked.     Resp Syncytial Virus by PCR NEGATIVE NEGATIVE    Comment: (NOTE) Fact Sheet for Patients: BloggerCourse.com  Fact Sheet for Healthcare Providers: SeriousBroker.it  This test is not yet approved or cleared by the [place] FDA and has been authorized for detection and/or diagnosis of SARS-CoV-2 by FDA under an Emergency Use Authorization (EUA). This EUA will remain in effect (meaning this test can be used) for the duration of the COVID-19 declaration under Section 564(b)(1) of the Act, 21 U.S.C. section 360bbb-3(b)(1), unless the authorization is terminated or revoked.  Performed at [facility], [address], [place], [place] [address]   Comprehensive metabolic panel     Status: Abnormal   Collection Time: 04/28/22  7:55 PM  Result Value Ref Range   Sodium 142 135 - 145 mmol/L   Potassium 4.0 3.5 - 5.1 mmol/L    Chloride 96 (L) 98 - 111 mmol/L   CO2 30 22 - 32 mmol/L   Glucose, Bld 102 (H) 70 - 99 mg/dL    Comment: Glucose reference range applies only to samples taken after fasting for at least 8 hours.   BUN 12 6 - 20 mg/dL   Creatinine, Ser 4.78 (H) 0.61 - 1.24 mg/dL   Calcium 9.1 8.9 - 29.5 mg/dL   Total Protein 6.7 6.5 - 8.1 g/dL   Albumin 3.8 3.5 - 5.0 g/dL   AST 17 15 - 41 U/L   ALT 13 0 - 44 U/L   Alkaline Phosphatase 57 38 - 126 U/L   Total Bilirubin 0.9 0.3 - 1.2 mg/dL   GFR, Estimated 7 (L) >60 mL/min    Comment: (NOTE) Calculated using the CKD-EPI Creatinine Equation (2021)    Anion gap 16 (H) 5 - 15    Comment: Performed at [facility], [address] [place], [place] [address]  Lactic acid, plasma     Status: None   Collection Time: 04/28/22  7:55 PM  Result Value Ref Range   Lactic Acid, Venous 1.5 0.5 - 1.9 mmol/L    Comment: Performed at [facility], [address], [place], [place] [address]  CBC with Differential     Status: Abnormal   Collection Time: 04/28/22  7:55 PM  Result Value Ref Range   WBC 5.4 4.0 - 10.5 K/uL   RBC 3.61 (L) 4.22 - 5.81 MIL/uL   Hemoglobin 11.2 (L) 13.0 - 17.0 g/dL   HCT 46.9 (L) 62.9 - 52.8 %   MCV 97.8 80.0 - 100.0 fL   MCH 31.0 26.0 - 34.0 pg   MCHC 31.7 30.0 - 36.0 g/dL   RDW 41.3 (H) 24.4 - 01.0 %   Platelets 60 (L) 150 - 400 K/uL    Comment: Immature Platelet Fraction may be clinically indicated, consider ordering this additional test [id number] REPEATED TO VERIFY PLATELET COUNT CONFIRMED BY SMEAR    nRBC 0.0 0.0 - 0.2 %   Neutrophils Relative % 78 %   Neutro Abs 4.2 1.7 - 7.7 K/uL   Lymphocytes Relative 12 %   Lymphs Abs 0.7 0.7 - 4.0 K/uL   Monocytes Relative 8 %   Monocytes Absolute 0.4 0.1 - 1.0 K/uL   Eosinophils Relative 1 %   Eosinophils Absolute 0.0 0.0 - 0.5 K/uL   Basophils Relative 1 %   Basophils Absolute 0.1 0.0 - 0.1  K/uL   Immature Granulocytes 0 %   Abs Immature Granulocytes 0.01 0.00 -  0.07 K/uL    Comment: Performed at [facility], [address] [place], [place] [address]  Protime-INR     Status: None   Collection Time: 04/28/22  7:55 PM  Result Value Ref Range   Prothrombin Time 14.3 11.4 - 15.2 seconds   INR 1.1 0.8 - 1.2    Comment: (NOTE) INR goal varies based on device and disease states. Performed at [facility], [address] [place], [place] [address]   Lactic acid, plasma     Status: None   Collection Time: 04/28/22 10:07 PM  Result Value Ref Range   Lactic Acid, Venous 1.4 0.5 - 1.9 mmol/L    Comment: Performed at [facility], [address] [place], [place] [address]   CT Head Wo Contrast  Result Date: 04/28/2022 CLINICAL DATA:  New onset headache. EXAM: CT HEAD WITHOUT CONTRAST TECHNIQUE: Contiguous axial images were obtained from the base of the skull through the vertex without intravenous contrast. RADIATION DOSE REDUCTION: This exam was performed according to the departmental dose-optimization program which includes automated exposure control, adjustment of the mA and/or kV according to patient size and/or use of iterative reconstruction technique. COMPARISON:  CT dated 02/05/2022 FINDINGS: Brain: There is mild right frontoparietal convexity dural thickening in the location of a previous subdural hematoma. This was seen on the last CT and again is no more than 2-3 mm in thickness, alternatively could be a small thin chronic subdural hematoma. No recent parenchymal or extra-axial bleed preserved ventral and no cortical based infarct is seen. There is mild atrophy and small-vessel disease. The ventricles are normal in size and position. Basal cisterns are clear. Vascular: Calcific plaques both siphons both distal vertebral arteries. No hyperdense central vessel. Skull: Negative for fractures or focal lesions. Sinuses/Orbits: No acute findings. There is chronic fluid opacification of the lower right mastoid air cells. Remaining bilateral  mastoids are clear. Trace chronic membrane disease maxillary sinuses. Other: None. IMPRESSION: 1. No acute intracranial CT findings or interval changes. 2. Mild right frontoparietal convexity dural thickening in the location of a previous subdural hematoma. This is no more than 2-3 mm in thickness without mass effect, alternatively could be a small chronic subdural hematoma. 3. Atrophy and small-vessel disease. 4. Chronic fluid opacification of the lower right mastoid air cells. Electronically Signed   By: Almira BarKeith  Chesser M.D.   On: 04/28/2022 22:53   DG Chest 2 View  Result Date: 04/28/2022 CLINICAL DATA:  Possible sepsis EXAM: CHEST - 2 VIEW COMPARISON:  11/15/2021 FINDINGS: Cardiac shadow is stable. Aortic calcifications are noted. The lungs are well aerated bilaterally. Mild interstitial prominence is again seen in the bases stable from the prior study. No bony abnormality is seen. IMPRESSION: Chronic interstitial prominence in the bases. No acute abnormality is noted. Electronically Signed   By: Alcide CleverMark  Lukens M.D.   On: 04/28/2022 21:24    Pending Labs Unresulted Labs (From admission, onward)     Start     Ordered   04/28/22 1941  Culture, blood (Routine x 2)  BLOOD CULTURE X 2,   R      04/28/22 1940            Vitals/Pain Today's Vitals   04/28/22 2315 04/28/22 2330 04/28/22 2345 04/28/22 2348  BP: (!) 207/73 (!) 230/80 (!) 199/77 (!) 199/77  Pulse: 85 92 95   Resp:  13 16 (!) 33   Temp:   99.7 F (37.6 C)   TempSrc:   Oral   SpO2: 95% 98% 96%   Weight:      Height:      PainSc:        Isolation Precautions No active isolations  Medications Medications  acetaminophen (TYLENOL) tablet 650 mg (has no administration in time range)  fentaNYL (SUBLIMAZE) injection 25 mcg (has no administration in time range)  albuterol (PROVENTIL) (2.5 MG/3ML) 0.083% nebulizer solution (  Given 04/28/22 2012)  ipratropium (ATROVENT) 0.02 % nebulizer solution (  Given 04/28/22 2012)  labetalol  (NORMODYNE) injection 20 mg (20 mg Intravenous Given 04/28/22 2057)  labetalol (NORMODYNE) injection 20 mg (20 mg Intravenous Given 04/28/22 2202)  hydrALAZINE (APRESOLINE) injection 20 mg (20 mg Intravenous Given 04/28/22 2349)  cloNIDine (CATAPRES) tablet 0.2 mg (0.2 mg Oral Given 04/28/22 2348)    Mobility walks     Focused Assessments     R Recommendations: See Admitting Provider Note  Report given to:   Additional Notes:

## 2022-04-29 NOTE — Consult Note (Signed)
Renal Service Consult Note [facility]  Kirk [name] 04/29/2022 [name], MD Requesting Physician: Dr. Blake Divine  Reason for Consult: [name] w/ acute hypoxic resp failure from viral infection (?) HPI: The patient is a 62 y.o. year-old w/ PMH as below who presented to ED last night c/o SOB, cough, URI symptoms, chills and fevers. Temp in ED was 101 deg F, also BP's were high 230s systolic. CXR was negative, resp panel + for rhinovirus, COVID / flu negative. BP's improved w/ po and IV meds. Pt is on HD MWF, has not missed HD, last session was Friday. Pt admitted and we are asked to see for dialysis.    Pt seen in room. He is using bipap. States usually UF is around 2-4kg at OP dialysis. No leg swelling. Other symptoms as above.   ROS - denies CP, no joint pain, no HA, no blurry vision, no rash, no diarrhea, no nausea/ vomiting, no dysuria, no difficulty voiding   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)    Anxiety    Asthma    Asthma    moderate persistent   Carpal tunnel syndrome    Cellulitis and abscess of trunk    Cholelithiasis 07/13/2014   Chronic headaches    Debility, unspecified    Dermatophytosis of the body    Dysrhythmia    history of   Edema    End stage renal disease on dialysis    "MWF; [facility] in [facility]" (10/21/2014)   Essential hypertension, benign    GERD (gastroesophageal reflux disease)    Gout, unspecified    HTN (hypertension)    Hypertrophy of prostate without urinary obstruction and other lower urinary tract symptoms (LUTS)    Hypotension, unspecified    Impotence of organic origin    Insomnia, unspecified    Kidney replaced by transplant    Localization-related (focal) (partial) epilepsy and epileptic syndromes with complex partial seizures,  without mention of intractable epilepsy    12-15-19- Wife states he has NEVER had a seizure    Lumbago    Memory loss    OSA on CPAP    Other and unspecified hyperlipidemia    controlled /managed per wife    Other chronic nonalcoholic liver disease    Other malaise and fatigue    Other nonspecific abnormal serum enzyme levels    Pain in joint, lower leg    Pain in joint, upper arm    Pneumonia "several times"   PONV (postoperative nausea and vomiting)    Renal dialysis status(V45.11) 02/05/2010   restarted 01/02/13 ofter renal trransplant failure   Secondary hyperparathyroidism (of renal origin)    Shortness of breath    Sleep apnea    wears cpap    Tension headache    Unspecified constipation    Unspecified essential hypertension    Unspecified hereditary and idiopathic peripheral neuropathy    Unspecified vitamin D deficiency    Past Surgical History  Past Surgical History:  Procedure Laterality Date   AV FISTULA PLACEMENT Left ?2010   "forearm; at [facility]"   BACK SURGERY     CARDIAC CATHETERIZATION  03/21/2011   CHOLECYSTECTOMY N/A 10/21/2014   Procedure: LAPAROSCOPIC CHOLECYSTECTOMY WITH INTRAOPERATIVE CHOLANGIOGRAM;  Surgeon: Chevis Pretty III, MD;  Location:  [facility] OR;  Service: General;  Laterality: N/A;   COLONOSCOPY     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   IR ANGIO INTRA EXTRACRAN SEL INTERNAL CAROTID UNI R MOD SED  11/30/2021   IR ANGIOGRAM FOLLOW UP STUDY  11/30/2021   IR BONE MARROW BIOPSY & ASPIRATION  03/27/2022   IR NEURO EACH ADD'L AFTER BASIC UNI RIGHT (MS)  11/30/2021   IR TRANSCATH/EMBOLIZ  11/30/2021   KIDNEY TRANSPLANT  08/17/2011   [facility]    LAPAROSCOPIC CHOLECYSTECTOMY  10/21/2014   w/IOC   LEFT HEART CATHETERIZATION WITH CORONARY ANGIOGRAM N/A 03/21/2011   Procedure: LEFT HEART CATHETERIZATION WITH CORONARY ANGIOGRAM;  Surgeon: Chrystie Nose, MD;  Location: [facility] CATH LAB;  Service: Cardiovascular;  Laterality: N/A;    NEPHRECTOMY  08/2013   removed transplaned kidney   POLYPECTOMY     POSTERIOR FUSION CERVICAL SPINE  06/25/2012   for spinal stenosis   RADIOLOGY WITH ANESTHESIA N/A 11/30/2021   Procedure: RIGHT MIDDLE MENINGEAL ARTERY EMBOLIZATION;  Surgeon: Radiologist, Medication, MD;  Location: [facility] OR;  Service: Radiology;  Laterality: N/A;   VASECTOMY  2010   Family History  Family History  Adopted: Yes  Problem Relation Age of Onset   Colon cancer Neg Hx    Esophageal cancer Neg Hx    Rectal cancer Neg Hx    Stomach cancer Neg Hx    Colon polyps Neg Hx    Social History  reports that he quit smoking about 2 years ago. His smoking use included cigarettes. He started smoking about 31 years ago. He has a 16.00 pack-year smoking history. He has never used smokeless tobacco. He reports that he does not drink alcohol and does not use drugs. Allergies  Allergies  Allergen Reactions   Codeine Nausea And Vomiting   Hydrocodone-Acetaminophen Nausea And Vomiting   Home medications Prior to Admission medications   Medication Sig Start Date End Date Taking? Authorizing Provider  acetaminophen (TYLENOL) 500 MG tablet Take 1 tablet (500 mg total) by mouth every 6 (six) hours as needed for moderate pain. 09/12/14   Standley Brooking, MD  amLODipine (NORVASC) 10 MG tablet Take 1 tablet (10 mg total) by mouth every evening. 11/29/21   Angiulli, Mcarthur Rossetti, PA-C  aspirin 81 MG chewable tablet Chew 81 mg by mouth daily. 09/28/20   [provider]  budesonide-formoterol (SYMBICORT) 160-4.5 MCG/ACT inhaler Inhale 2 puffs into the lungs as needed. 11/16/20   [provider]  calcitRIOL (ROCALTROL) 0.5 MCG capsule Take 2 capsules (1 mcg total) by mouth every Monday, Wednesday, and Friday with hemodialysis. 06/04/18   Black, Lesle Chris, NP  Cholecalciferol (VITAMIN D3) 25 MCG (1000 UT) CAPS Take 4,000 Units by mouth daily.    [provider]  cinacalcet (SENSIPAR) 30 MG tablet Take 2 tablets (60 mg  total) by mouth 3 (three) times a week. 11/29/21   Angiulli, Mcarthur Rossetti, PA-C  cloNIDine (CATAPRES) 0.2 MG tablet TAKE 1 TABLET (0.2 MG TOTAL) BY MOUTH EVERY 6 (SIX) HOURS AS NEEDED (SBP >170 OR DBP>100). 04/20/22   Alver Sorrow, NP  diphenhydrAMINE (BENADRYL) 25 mg capsule Take 25 mg by mouth every 6 (six) hours as needed for allergies.    [provider]  doxazosin (CARDURA) 8 MG tablet Take 1 tablet (8 mg total) by mouth daily. 04/24/22   Alver Sorrow, NP  DULoxetine (CYMBALTA) 30 MG capsule TAKE 1 CAPSULE BY MOUTH EVERY DAY 03/26/22   Ngetich, [employer]  C, NP  FOSRENOL 1000 MG PACK Take 1 packet by mouth 3 (three) times daily. 02/26/22   [provider]  guaiFENesin (MUCINEX) 600 MG 12 hr tablet Take 1,200 mg by mouth 2 (two) times daily.    [provider]  hydrALAZINE (APRESOLINE) 100 MG tablet Take 1 tablet (100 mg total) by mouth 3 (three) times daily. 03/29/22   Ngetich, Dinah C, NP  isosorbide mononitrate (IMDUR) 60 MG 24 hr tablet Take 1 tablet (60 mg total) by mouth daily. 04/24/22   Alver Sorrow, NP  levETIRAcetam (KEPPRA) 250 MG tablet TAKE 1 TABLET (250 MG TOTAL) BY MOUTH EVERY MONDAY, WEDNESDAY, AND FRIDAY WITH HEMODIALYSIS. 03/26/22 06/24/22  Ngetich, Dinah C, NP  levETIRAcetam (KEPPRA) 750 MG tablet Take 1 tablet (750 mg total) by mouth daily. 02/20/22 05/16/23  Windell Norfolk, MD  methocarbamol (ROBAXIN) 750 MG tablet Take 1 tablet (750 mg total) by mouth 4 (four) times daily. 11/29/21   Angiulli, Mcarthur Rossetti, PA-C  metoprolol tartrate (LOPRESSOR) 100 MG tablet Take 1 tablet (100 mg total) by mouth 2 (two) times daily. 01/18/22   Ngetich, Dinah C, NP  multivitamin (RENA-VIT) TABS tablet Take 1 tablet by mouth daily. 11/29/21   Angiulli, Mcarthur Rossetti, PA-C  ondansetron (ZOFRAN-ODT) 4 MG disintegrating tablet TAKE 1 TABLET BY MOUTH EVERY 8 HOURS AS NEEDED FOR NAUSEA AND VOMITING 02/28/22   Ngetich, Dinah C, NP  oxyCODONE (OXY IR/ROXICODONE) 5 MG immediate release tablet Take  1-2 tablets (5-10 mg total) by mouth every 4 (four) hours as needed for moderate pain. 01/18/22   Ngetich, Dinah C, NP  pantoprazole (PROTONIX) 40 MG tablet Take 1 tablet (40 mg total) by mouth 2 (two) times daily before a meal. 11/29/21   Angiulli, Mcarthur Rossetti, PA-C  pravastatin (PRAVACHOL) 40 MG tablet TAKE 1 TABLET BY MOUTH EVERY DAY IN THE EVENING 10/19/21   Ngetich, Dinah C, NP  sevelamer carbonate (RENVELA) 800 MG tablet Take 2 tablets (1,600 mg total) by mouth as needed (snacks). 11/29/21   Angiulli, Mcarthur Rossetti, PA-C  sevelamer carbonate (RENVELA) 800 MG tablet Take 4 tablets (3,200 mg total) by mouth 3 (three) times daily with meals. 11/29/21   Angiulli, Mcarthur Rossetti, PA-C  sucralfate (CARAFATE) 1 GM/10ML suspension Take 10 mLs (1 g total) by mouth 4 (four) times daily -  with meals and at bedtime. 11/29/21   Angiulli, Mcarthur Rossetti, PA-C  traZODone (DESYREL) 50 MG tablet Take 1 tablet (50 mg total) by mouth at bedtime. 11/29/21   Angiulli, Mcarthur Rossetti, PA-C  valsartan (DIOVAN) 160 MG tablet Take 1 tablet (160 mg total) by mouth daily. 04/24/22   Alver Sorrow, NP     Vitals:   04/29/22 0804 04/29/22 1217 04/29/22 1519 04/29/22 1601  BP: (!) 161/66 (!) 165/67  (!) 183/70  Pulse: 67 71  69  Resp: 20 16  16   Temp: 99.3 F (37.4 C) 99 F (37.2 C)  98.4 F (36.9 C)  TempSrc: Oral Oral  Oral  SpO2: 95% (!) 89% 95% (!) 89%  Weight:      Height:       Exam Gen alert, no distress No rash, cyanosis or gangrene Sclera anicteric, throat clear  No jvd or bruits Chest diffuse bilat scattered coarse crackles, no wheezing  RRR no MRG Abd soft ntnd no mass or ascites +bs GU normal male MS no joint effusions or deformity Ext no LE or UE edema, no wounds or ulcers Neuro is alert, Ox 3 , nf  LUA AVF+bruit    Home meds include - norvasc 10, cardura 8, cymbalta, fosrenol 1 gm ac tid, imdur 60, keppra, lopressor 100 bid, pravastatin, desyrel, valsartan 150 qd, prns/ vits/ supps     OP HD: MWF [facility]  4h  15min  86.4kg   2K/2Ca bath   LUE AVF   Heparin 2500  - last HD 4/05, post wt 86.3kg  - venofer 50 mg weekly - mircera 200 mcg IV q 2 wks, last 4/01, due 4/15 - rocaltrol 1.25 mcg po tiw   Assessment/ Plan: Acute hypoxic resp failure - d/t rhinovirus infection. Requiring [place] O2. CXR negative. Getting duonebs, prednisone and prn cough meds. LA wnl. Per pmd ESRD - on HD MWF. Has not missed OP HD. Next HD tomorrow.  HTN urgency - seems to have responded to pain meds, IV and po HTN meds. Is on 4 HTN meds at home and here.  Volume - is minimally up by wts, no edema on exam. UF goal 2-3kg as he may be losing body wt (he thinks he is).  Anemia esrd - Hb 10-12 , no esa needs at this time MBD ckd - CCa and phos are in range. Cont fosrenol as binder. Get records.       Kirk Moselleob Jahzier Villalon  MD [facility] 04/29/2022, 5:59 PM  Recent Labs  Lab 04/28/22 1955 04/29/22 0602  HGB 11.2* 10.5*  ALBUMIN 3.8  --   CALCIUM 9.1 8.7*  PHOS  --  4.7*  CREATININE 7.91* 8.76*  K 4.0 3.9   Inpatient medications:  amLODipine  10 mg Oral QPM   doxazosin  8 mg Oral Daily   DULoxetine  30 mg Oral Daily   ipratropium-albuterol  3 mL Nebulization Q6H   irbesartan  150 mg Oral Daily   isosorbide mononitrate  60 mg Oral Daily   lanthanum  1,000 mg Oral TID WC   [START ON 04/30/2022] levETIRAcetam  250 mg Oral Q M,W,F-HD   metoprolol tartrate  100 mg Oral BID   pravastatin  40 mg Oral q1800   predniSONE  60 mg Oral QAC breakfast   sodium chloride flush  3 mL Intravenous Q12H   sodium chloride flush  3 mL Intravenous Q12H   traZODone  50 mg Oral QHS    sodium chloride     sodium chloride, acetaminophen, albuterol, benzonatate, fentaNYL (SUBLIMAZE) injection, guaiFENesin-dextromethorphan, hydrALAZINE, ondansetron **OR** ondansetron (ZOFRAN) IV, sodium chloride flush

## 2022-04-30 ENCOUNTER — Inpatient Hospital Stay (HOSPITAL_COMMUNITY): Payer: Medicare Other

## 2022-04-30 DIAGNOSIS — D696 Thrombocytopenia, unspecified: Secondary | ICD-10-CM | POA: Diagnosis not present

## 2022-04-30 DIAGNOSIS — R4182 Altered mental status, unspecified: Secondary | ICD-10-CM

## 2022-04-30 DIAGNOSIS — I169 Hypertensive crisis, unspecified: Secondary | ICD-10-CM | POA: Diagnosis not present

## 2022-04-30 DIAGNOSIS — N186 End stage renal disease: Secondary | ICD-10-CM | POA: Diagnosis not present

## 2022-04-30 DIAGNOSIS — R569 Unspecified convulsions: Secondary | ICD-10-CM | POA: Diagnosis not present

## 2022-04-30 LAB — BASIC METABOLIC PANEL
Anion gap: 13 (ref 5–15)
BUN: 26 mg/dL — ABNORMAL HIGH (ref 6–20)
CO2: 28 mmol/L (ref 22–32)
Calcium: 8.8 mg/dL — ABNORMAL LOW (ref 8.9–10.3)
Chloride: 94 mmol/L — ABNORMAL LOW (ref 98–111)
Creatinine, Ser: 10.44 mg/dL — ABNORMAL HIGH (ref 0.61–1.24)
GFR, Estimated: 5 mL/min — ABNORMAL LOW (ref >60)
Glucose, Bld: 114 mg/dL — ABNORMAL HIGH (ref 70–99)
Potassium: 4.2 mmol/L (ref 3.5–5.1)
Sodium: 135 mmol/L (ref 135–145)

## 2022-04-30 LAB — CBC
HCT: 32.9 % — ABNORMAL LOW (ref 39.0–52.0)
Hemoglobin: 10.8 g/dL — ABNORMAL LOW (ref 13.0–17.0)
MCH: 30.9 pg (ref 26.0–34.0)
MCHC: 32.8 g/dL (ref 30.0–36.0)
MCV: 94.3 fL (ref 80.0–100.0)
Platelets: 55 10*3/uL — ABNORMAL LOW (ref 150–400)
RBC: 3.49 MIL/uL — ABNORMAL LOW (ref 4.22–5.81)
RDW: 16.7 % — ABNORMAL HIGH (ref 11.5–15.5)
WBC: 4.2 10*3/uL (ref 4.0–10.5)
nRBC: 0 % (ref 0.0–0.2)

## 2022-04-30 LAB — GLUCOSE, CAPILLARY: Glucose-Capillary: 126 mg/dL — ABNORMAL HIGH (ref 70–99)

## 2022-04-30 MED ORDER — HEPARIN SODIUM (PORCINE) 1000 UNIT/ML DIALYSIS
2500.0000 [IU] | Freq: Once | INTRAMUSCULAR | Status: AC
  Administered 2022-04-30: 2500 [IU] via INTRAVENOUS_CENTRAL
  Filled 2022-04-30: qty 3

## 2022-04-30 MED ORDER — HEPARIN SODIUM (PORCINE) 1000 UNIT/ML DIALYSIS: 2500.0000 [IU] | Freq: Once | INTRAMUSCULAR | Status: DC

## 2022-04-30 NOTE — Progress Notes (Signed)
[facility]                                                                               Kirk [name], is a 62 y.o. male, DOB - 1960/11/06, YQI:[id number] Admit date - 04/28/2022    Outpatient Primary MD for the patient is Ngetich, Dinah C, NP  LOS - 1  days    Brief summary   Kirk [name] is a 62 y.o. 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  CT is negative for acute intracranial abnormality. Labs are notable for normal lactic acid, normal WBC, platelets 60,000, normal potassium, normal serum bicarbonate, and normal BUN. COVID, influenza, and RSV PCR are negative.  Respiratory panel shows rhinovirus positive.   Assessment & Plan    Assessment and Plan:   Acute respiratory failure with hypoxia secondary to rhino virus infection Initially required 2lit of [place] oxygen, weaned him off oxygen. D dimer is negative.  Symptomatic management with prednisone and cough medication.  Duo nebs prn.  Lactic acid wnl.  Procalcitonin is 2.36.     Hypertensive crisis:  Restart home meds.  BP parameters have improved.  On hydralazine 100 mg TID, amlodipine 10 mg daily, Irbesartan, imdur 60 mg daily, metoprolol 100 mg BID.    ESRD on HD MWF Nephrology consulted for HD in am.    Chronic thrombocytopenia;  No obvious signs of bleeding.    Anemia of chronic disease:  Monitor.   Headache;  Improving.   Seizures:  On Keppra 250 mg on the days of HD.   Hyperlipidemia:  Pravastatin.    Estimated body mass index is 29.59 kg/m as calculated from the following:   Height as of this encounter: 5\' 7"  (1.702 m).   Weight as of this encounter: 85.7 kg.  Code Status: full code.  DVT Prophylaxis:  SCDs Start: 04/29/22 0223   Level of Care: Level of care:  Progressive Family Communication: none at bedside.   Disposition Plan:     Remains inpatient appropriate:  persistent symptoms.   Procedures:  None.   Consultants:   Nephrology.   Antimicrobials:   Anti-infectives (From admission, onward)    None        Medications  Scheduled Meds:  amLODipine  10 mg Oral QPM   Chlorhexidine Gluconate Cloth  6 each Topical Q0600   doxazosin  8 mg Oral Daily   DULoxetine  30 mg Oral Daily   hydrALAZINE  100 mg Oral Q8H   ipratropium-albuterol  3 mL Nebulization Q6H   irbesartan  150 mg Oral Daily   isosorbide mononitrate  60 mg Oral Daily   lanthanum  1,000 mg Oral TID WC   levETIRAcetam  250 mg Oral Q M,W,F-HD   metoprolol tartrate  100 mg Oral BID   pravastatin  40 mg Oral q1800   predniSONE  60 mg Oral QAC breakfast   sodium chloride flush  3 mL Intravenous Q12H   sodium chloride flush  3 mL Intravenous Q12H   traZODone  50 mg Oral QHS   Continuous Infusions:  sodium chloride     PRN Meds:.sodium chloride, acetaminophen, albuterol, benzonatate, fentaNYL (SUBLIMAZE) injection, guaiFENesin-dextromethorphan, hydrALAZINE, ondansetron **OR** ondansetron (ZOFRAN) IV, sodium chloride flush    Subjective:   Kirk [name] was seen and examined today. Improving. Weaned off oxygen.  Objective:   Vitals:   05/16/2022 1402 May 16, 2022 1608 May 16, 2022 1735 2022-05-16 1736  BP:  (!) 173/57 (!) 161/63 (!) 161/63  Pulse: 79 63    Resp: (!) 25 20    Temp:  98.5 F (36.9 [name])    TempSrc:  Oral    SpO2: 98% 95%    Weight:      Height:        Intake/Output Summary (Last 24 hours) at 05-16-22 1750 Last data filed at 2022-05-16 1159 Gross per 24 hour  Intake --  Output 2500 ml  Net -2500 ml    Filed Weights   16-May-2022 0411 2022/05/16 0740 05-16-2022 1159  Weight: 87.5 kg 94.3 kg 85.7 kg     Exam General exam: Appears calm and comfortable  Respiratory system: Clear to auscultation. Respiratory effort normal. Cardiovascular system: S1 & S2  heard, RRR. No JVD, . No pedal edema. Gastrointestinal system: Abdomen is nondistended, soft and nontender.  Central nervous system: Alert and oriented. No focal neurological deficits. Extremities: Symmetric 5 x 5 power. Skin: No rashes, Psychiatry: Mood & affect appropriate.     Data Reviewed:  I have personally reviewed following labs and imaging studies   CBC Lab Results  Component Value Date   WBC 4.2 May 16, 2022   RBC 3.49 (L) 05/16/22   HGB 10.8 (L) 05-16-22   HCT 32.9 (L) 2022-05-16   MCV 94.3 2022/05/16   MCH 30.9 May 16, 2022   PLT 55 (L) 16-May-2022   MCHC 32.8 05-16-2022   RDW 16.7 (H) 16-May-2022   LYMPHSABS 0.7 04/28/2022   MONOABS 0.4 04/28/2022   EOSABS 0.0 04/28/2022   BASOSABS 0.1 04/28/2022     Last metabolic panel Lab Results  Component Value Date   NA 135 05/16/2022   K 4.2 05/16/2022   CL 94 (L) 05/16/2022   CO2 28 05/16/22   BUN 26 (H) 05-16-2022   CREATININE 10.44 (H) 16-May-2022   GLUCOSE 114 (H) 16-May-2022   GFRNONAA 5 (L) 2022-05-16   GFRAA 4 (L) 07/15/2019   CALCIUM 8.8 (L) 2022-05-16   PHOS 4.7 (H) 04/29/2022   PROT 6.7 04/28/2022   ALBUMIN 3.8 04/28/2022   LABGLOB 2.6 05/16/2021   AGRATIO 1.5 05/16/2021   BILITOT 0.9 04/28/2022   ALKPHOS 57 04/28/2022   AST 17 04/28/2022   ALT 13 04/28/2022   ANIONGAP 13 05-16-22    CBG (last 3)  No results for input(s): "GLUCAP" in the last 72 hours.    Coagulation Profile: Recent Labs  Lab 04/28/22 1955  INR 1.1      Radiology Studies: EEG adult  Result Date: 05/16/2022 Kirk Quest, MD     2022/05/16  4:00 PM Patient Name: Kirk [name] MRN: [id number] Epilepsy Attending: Charlsie [name] Referring Physician/Provider: Kathlen Mody, MD Date: 05/16/2022 Duration: 22.36 mins Patient history: 62yo M getting EEG to evaluate for seizure. Level of alertness: Awake, asleep AEDs during EEG study: LEV Technical aspects: This EEG study was done with scalp electrodes positioned according  to the 10-20 International system of electrode placement. Electrical activity was reviewed with band pass filter of 1-70Hz , sensitivity of 7 uV/mm, display speed of 34mm/sec with a 60Hz  notched filter applied  as appropriate. EEG data were recorded continuously and digitally stored.  Video monitoring was available and reviewed as appropriate. Description: The posterior dominant rhythm consists of 9-10 Hz activity of moderate voltage (25-35 uV) seen predominantly in posterior head regions, symmetric and reactive to eye opening and eye closing. Sleep was characterized by vertex waves, sleep spindles (12 to 14 Hz), maximal frontocentral region.  Hyperventilation and photic stimulation were not performed.   IMPRESSION: This study is within normal limits. No seizures or epileptiform discharges were seen throughout the recording. A normal interictal EEG does not exclude the diagnosis of epilepsy. Kirk Questriyanka O Yadav   CT Head Wo Contrast  Result Date: 04/28/2022 CLINICAL DATA:  New onset headache. EXAM: CT HEAD WITHOUT CONTRAST TECHNIQUE: Contiguous axial images were obtained from the base of the skull through the vertex without intravenous contrast. RADIATION DOSE REDUCTION: This exam was performed according to the departmental dose-optimization program which includes automated exposure control, adjustment of the mA and/or kV according to patient size and/or use of iterative reconstruction technique. COMPARISON:  CT dated 02/05/2022 FINDINGS: Brain: There is mild right frontoparietal convexity dural thickening in the location of a previous subdural hematoma. This was seen on the last CT and again is no more than 2-3 mm in thickness, alternatively could be a small thin chronic subdural hematoma. No recent parenchymal or extra-axial bleed preserved ventral and no cortical based infarct is seen. There is mild atrophy and small-vessel disease. The ventricles are normal in size and position. Basal cisterns are clear. Vascular:  Calcific plaques both siphons both distal vertebral arteries. No hyperdense central vessel. Skull: Negative for fractures or focal lesions. Sinuses/Orbits: No acute findings. There is chronic fluid opacification of the lower right mastoid air cells. Remaining bilateral mastoids are clear. Trace chronic membrane disease maxillary sinuses. Other: None. IMPRESSION: 1. No acute intracranial CT findings or interval changes. 2. Mild right frontoparietal convexity dural thickening in the location of a previous subdural hematoma. This is no more than 2-3 mm in thickness without mass effect, alternatively could be a small chronic subdural hematoma. 3. Atrophy and small-vessel disease. 4. Chronic fluid opacification of the lower right mastoid air cells. Electronically Signed   By: Almira BarKeith  Chesser M.D.   On: 04/28/2022 22:53   DG Chest 2 View  Result Date: 04/28/2022 CLINICAL DATA:  Possible sepsis EXAM: CHEST - 2 VIEW COMPARISON:  11/15/2021 FINDINGS: Cardiac shadow is stable. Aortic calcifications are noted. The lungs are well aerated bilaterally. Mild interstitial prominence is again seen in the bases stable from the prior study. No bony abnormality is seen. IMPRESSION: Chronic interstitial prominence in the bases. No acute abnormality is noted. Electronically Signed   By: Alcide CleverMark  Lukens M.D.   On: 04/28/2022 21:24       Kirk ModyVijaya Donne Baley M.D. [facility] 04/30/2022, 5:50 PM  Available via Epic secure chat 7am-7pm After 7 pm, please refer to night coverage provider listed on amion.

## 2022-04-30 NOTE — Care Management Important Message (Signed)
Important Message  Patient Details  Name: Kirk [name] MRN: [id number] Date of Birth: 04/02/1960   Medicare Important Message Given:  Yes     Renie Ora 04/30/2022, 12:17 PM

## 2022-04-30 NOTE — Procedures (Signed)
I was present at this dialysis session. I have reviewed the session itself and made appropriate changes.   Here with with AHRF 2/2 viral URI. This AM on 3L [place] and used some BiPAP overnight.  Still having dyspnea and productive cough / phlegm this AM.  No fevers  K is 4.2 on 3 K bath.  UF goal of 3L.  Hb 10.8 stable.  Next HF 4/10 on schedule.   Filed Weights   04/29/22 0139 04/30/22 0411 04/30/22 0740  Weight: 87.1 kg 87.5 kg 94.3 kg    Recent Labs  Lab 04/29/22 0602 04/30/22 0203  NA 141 135  K 3.9 4.2  CL 95* 94*  CO2 29 28  GLUCOSE 104* 114*  BUN 16 26*  CREATININE 8.76* 10.44*  CALCIUM 8.7* 8.8*  PHOS 4.7*  --     Recent Labs  Lab 04/28/22 1955 04/29/22 0602 04/30/22 0203  WBC 5.4 6.6 4.2  NEUTROABS 4.2  --   --   HGB 11.2* 10.5* 10.8*  HCT 35.3* 32.9* 32.9*  MCV 97.8 97.6 94.3  PLT 60* 54* 55*    Scheduled Meds:  amLODipine  10 mg Oral QPM   Chlorhexidine Gluconate Cloth  6 each Topical Q0600   doxazosin  8 mg Oral Daily   DULoxetine  30 mg Oral Daily   heparin  2,500 Units Dialysis Once in dialysis   hydrALAZINE  100 mg Oral Q8H   ipratropium-albuterol  3 mL Nebulization Q6H   irbesartan  150 mg Oral Daily   isosorbide mononitrate  60 mg Oral Daily   lanthanum  1,000 mg Oral TID WC   levETIRAcetam  250 mg Oral Q M,W,F-HD   metoprolol tartrate  100 mg Oral BID   pravastatin  40 mg Oral q1800   predniSONE  60 mg Oral QAC breakfast   sodium chloride flush  3 mL Intravenous Q12H   sodium chloride flush  3 mL Intravenous Q12H   traZODone  50 mg Oral QHS   Continuous Infusions:  sodium chloride     PRN Meds:.sodium chloride, acetaminophen, albuterol, benzonatate, fentaNYL (SUBLIMAZE) injection, guaiFENesin-dextromethorphan, hydrALAZINE, ondansetron **OR** ondansetron (ZOFRAN) IV, sodium chloride flush   Sabra Heck  MD 04/30/2022, 9:00 AM

## 2022-04-30 NOTE — Care Management Note (Signed)
Patient is gone to dialysis.  Will go back after noon.

## 2022-04-30 NOTE — Progress Notes (Signed)
EEG complete - results pending 

## 2022-04-30 NOTE — Procedures (Signed)
Patient Name: KASTEN UEHLING  MRN: [id number]  Epilepsy Attending: Charlsie Quest  Referring Physician/Provider: Kathlen Mody, MD  Date: 04/30/2022 Duration: 22.36 mins  Patient history: 62yo M getting EEG to evaluate for seizure.  Level of alertness: Awake, asleep  AEDs during EEG study: LEV  Technical aspects: This EEG study was done with scalp electrodes positioned according to the 10-20 International system of electrode placement. Electrical activity was reviewed with band pass filter of 1-70Hz , sensitivity of 7 uV/mm, display speed of 43mm/sec with a 60Hz  notched filter applied as appropriate. EEG data were recorded continuously and digitally stored.  Video monitoring was available and reviewed as appropriate.  Description: The posterior dominant rhythm consists of 9-10 Hz activity of moderate voltage (25-35 uV) seen predominantly in posterior head regions, symmetric and reactive to eye opening and eye closing. Sleep was characterized by vertex waves, sleep spindles (12 to 14 Hz), maximal frontocentral region.  Hyperventilation and photic stimulation were not performed.     IMPRESSION: This study is within normal limits. No seizures or epileptiform discharges were seen throughout the recording.  A normal interictal EEG does not exclude the diagnosis of epilepsy.  Hilario Robarts Annabelle Harman

## 2022-04-30 NOTE — Progress Notes (Signed)
Pt receives out-pt HD at [facility] on MWF. Will assist as needed.   Jacky Hartung Renal Navigator [phone] 

## 2022-05-01 ENCOUNTER — Inpatient Hospital Stay (HOSPITAL_COMMUNITY): Payer: Medicare Other

## 2022-05-01 DIAGNOSIS — R569 Unspecified convulsions: Secondary | ICD-10-CM | POA: Diagnosis not present

## 2022-05-01 DIAGNOSIS — D696 Thrombocytopenia, unspecified: Secondary | ICD-10-CM | POA: Diagnosis not present

## 2022-05-01 DIAGNOSIS — I169 Hypertensive crisis, unspecified: Secondary | ICD-10-CM | POA: Diagnosis not present

## 2022-05-01 DIAGNOSIS — N186 End stage renal disease: Secondary | ICD-10-CM | POA: Diagnosis not present

## 2022-05-01 LAB — CBC
HCT: 32.2 % — ABNORMAL LOW (ref 39.0–52.0)
Hemoglobin: 10.5 g/dL — ABNORMAL LOW (ref 13.0–17.0)
MCH: 31.1 pg (ref 26.0–34.0)
MCHC: 32.6 g/dL (ref 30.0–36.0)
MCV: 95.3 fL (ref 80.0–100.0)
Platelets: 58 10*3/uL — ABNORMAL LOW (ref 150–400)
RBC: 3.38 MIL/uL — ABNORMAL LOW (ref 4.22–5.81)
RDW: 16.9 % — ABNORMAL HIGH (ref 11.5–15.5)
WBC: 3.5 10*3/uL — ABNORMAL LOW (ref 4.0–10.5)
nRBC: 0 % (ref 0.0–0.2)

## 2022-05-01 LAB — BASIC METABOLIC PANEL
Anion gap: 9 (ref 5–15)
BUN: 23 mg/dL — ABNORMAL HIGH (ref 6–20)
CO2: 30 mmol/L (ref 22–32)
Calcium: 8.9 mg/dL (ref 8.9–10.3)
Chloride: 97 mmol/L — ABNORMAL LOW (ref 98–111)
Creatinine, Ser: 8 mg/dL — ABNORMAL HIGH (ref 0.61–1.24)
GFR, Estimated: 7 mL/min — ABNORMAL LOW (ref >60)
Glucose, Bld: 118 mg/dL — ABNORMAL HIGH (ref 70–99)
Potassium: 4.2 mmol/L (ref 3.5–5.1)
Sodium: 136 mmol/L (ref 135–145)

## 2022-05-01 LAB — CULTURE, BLOOD (ROUTINE X 2): Special Requests: ADEQUATE

## 2022-05-01 LAB — HEPATITIS B SURFACE ANTIBODY, QUANTITATIVE: Hep B S AB Quant (Post): 29.3 m[IU]/mL (ref >9.9)

## 2022-05-01 MED ORDER — ISOSORBIDE MONONITRATE ER 60 MG PO TB24
90.0000 mg | ORAL_TABLET | Freq: Every day | ORAL | Status: DC
  Administered 2022-05-02 – 2022-05-06 (×5): 90 mg via ORAL
  Filled 2022-05-01 (×5): qty 1

## 2022-05-01 MED ORDER — SODIUM CHLORIDE 0.9 % IV SOLN
500.0000 mg | INTRAVENOUS | Status: DC
  Administered 2022-05-01 – 2022-05-02 (×2): 500 mg via INTRAVENOUS
  Filled 2022-05-01 (×3): qty 5

## 2022-05-01 MED ORDER — IPRATROPIUM BROMIDE 0.02 % IN SOLN
RESPIRATORY_TRACT | Status: AC
  Administered 2022-05-01: 0.5 mg
  Filled 2022-05-01: qty 2.5

## 2022-05-01 MED ORDER — PREDNISONE 20 MG PO TABS
40.0000 mg | ORAL_TABLET | Freq: Every day | ORAL | Status: DC
  Administered 2022-05-02: 40 mg via ORAL
  Filled 2022-05-01: qty 2

## 2022-05-01 MED ORDER — HEPARIN SODIUM (PORCINE) 5000 UNIT/ML IJ SOLN
5000.0000 [IU] | Freq: Three times a day (TID) | INTRAMUSCULAR | Status: DC
  Filled 2022-05-01 (×5): qty 1

## 2022-05-01 MED ORDER — SODIUM CHLORIDE 0.9 % IV SOLN
1.0000 g | INTRAVENOUS | Status: DC
  Administered 2022-05-01 – 2022-05-02 (×2): 1 g via INTRAVENOUS
  Filled 2022-05-01 (×2): qty 10

## 2022-05-01 NOTE — Progress Notes (Signed)
SATURATION QUALIFICATIONS: (This note is used to comply with regulatory documentation for home oxygen)  Patient Saturations on Room Air at Rest = 89%  Patient Saturations on Room Air while Ambulating = 86%  Patient Saturations on 4 Liters of oxygen while Ambulating = 93%  Please briefly explain why patient needs home oxygen:Unable to maintain adequate level oxygenation with activity without supplemental O2  [facility] PT Acute Rehabilitation Services Office [phone]

## 2022-05-01 NOTE — Progress Notes (Signed)
[facility]  [facility] Coordination Care Guide  Direct Dial: [phone]

## 2022-05-01 NOTE — Progress Notes (Signed)
[facility] Progress Note   Subjective:  Seen in room - working with PT who reports still getting hypoxic when walking on RA. Seems better at rest - no wheezing today. No CP or other symptoms. HD went fine yesterday - next tomorrow.    Objective Vitals:   05/01/22 0436 05/01/22 0735 05/01/22 0824 05/01/22 0856  BP: (!) 164/68  (!) 157/82 (!) 153/60  Pulse: 66  74 72  Resp: 18 18  18   Temp: 98.1 F (36.7 C)   98.6 F (37 C)  TempSrc: Axillary   Oral  SpO2: (!) 89%   95%  Weight:      Height:       Physical Exam General: Well appearing man, nasal O2 in place Heart: RRR; no murmur Lungs: CTAB; no rales or wheezing today Abdomen: soft Extremities: no LE edema Dialysis Access: LUE AVF + thrill  Additional Objective Labs: Basic Metabolic Panel: Recent Labs  Lab 04/29/22 0602 04/30/22 0203 05/01/22 0113  NA 141 135 136  K 3.9 4.2 4.2  CL 95* 94* 97*  CO2 29 28 30   GLUCOSE 104* 114* 118*  BUN 16 26* 23*  CREATININE 8.76* 10.44* 8.00*  CALCIUM 8.7* 8.8* 8.9  PHOS 4.7*  --   --    Liver Function Tests: Recent Labs  Lab 04/28/22 1955  AST 17  ALT 13  ALKPHOS 57  BILITOT 0.9  PROT 6.7  ALBUMIN 3.8   CBC: Recent Labs  Lab 04/28/22 1955 04/29/22 0602 04/30/22 0203 05/01/22 0113  WBC 5.4 6.6 4.2 3.5*  NEUTROABS 4.2  --   --   --   HGB 11.2* 10.5* 10.8* 10.5*  HCT 35.3* 32.9* 32.9* 32.2*  MCV 97.8 97.6 94.3 95.3  PLT 60* 54* 55* 58*   Studies/Results: EEG adult  Result Date: 04/30/2022 Charlsie Quest, MD     04/30/2022  4:00 PM Patient Name: Kirk [name] MRN: [id number] Epilepsy Attending: Charlsie Quest Referring Physician/Provider: Kathlen Mody, MD Date: 04/30/2022 Duration: 22.36 mins Patient history: 62yo M getting EEG to evaluate for seizure. Level of alertness: Awake, asleep AEDs during EEG study: LEV Technical aspects: This EEG study was done with scalp electrodes positioned according to the 10-20 International system of electrode  placement. Electrical activity was reviewed with band pass filter of 1-70Hz , sensitivity of 7 uV/mm, display speed of 69mm/sec with a 60Hz  notched filter applied as appropriate. EEG data were recorded continuously and digitally stored.  Video monitoring was available and reviewed as appropriate. Description: The posterior dominant rhythm consists of 9-10 Hz activity of moderate voltage (25-35 uV) seen predominantly in posterior head regions, symmetric and reactive to eye opening and eye closing. Sleep was characterized by vertex waves, sleep spindles (12 to 14 Hz), maximal frontocentral region.  Hyperventilation and photic stimulation were not performed.   IMPRESSION: This study is within normal limits. No seizures or epileptiform discharges were seen throughout the recording. A normal interictal EEG does not exclude the diagnosis of epilepsy. Priyanka Annabelle Harman    Medications:  sodium chloride      amLODipine  10 mg Oral QPM   doxazosin  8 mg Oral Daily   DULoxetine  30 mg Oral Daily   hydrALAZINE  100 mg Oral Q8H   ipratropium-albuterol  3 mL Nebulization Q6H   irbesartan  150 mg Oral Daily   isosorbide mononitrate  60 mg Oral Daily   lanthanum  1,000 mg Oral TID WC   levETIRAcetam  250 mg  Oral Q M,W,F-HD   metoprolol tartrate  100 mg Oral BID   pravastatin  40 mg Oral q1800   predniSONE  60 mg Oral QAC breakfast   sodium chloride flush  3 mL Intravenous Q12H   sodium chloride flush  3 mL Intravenous Q12H   traZODone  50 mg Oral QHS    Dialysis Orders: MWF [facility] 4:15hr, EDW 86.4kg, 2K/2Ca bath, LUE AVF, Heparin 2500 unit bolus - venofer 50 mg weekly - mircera 200 mcg IV q 2 wks, last 4/01, due 4/15 - rocaltrol 1.25 mcg po tiw   Assessment/ Plan: Acute hypoxic resp failure/rhinovirus: Still requiring O2 with exertion - getting prednisone, bronchodilators, cough meds. Slow improvement.  ESRD: Continue HD on MWF schedule -> HD tomorrow. HTN urgency: Improving, appears euvolemic on exam. Now  below prior EDW.  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 Secondary HTPH: Ca/Phos ok - continue lanthanum binder.  Hx seizure disorder: Continue home meds.  Ozzie Hoyle, PA-C 05/01/2022, 10:42 AM  [employer]

## 2022-05-01 NOTE — Consult Note (Signed)
   [facility] Inpatient Consult   05/01/2022  Kirk [name] 1960-05-11 [id number]  [facility] [[facility]]  Accountable Care Organization [ACO] Patient: Medicare ACO REACH  Primary Care Provider:  Caesar Bookman, NP with [facility]  *Patient is currently on Droplet precautions  Patient is currently active with [facility] [[facility]] Care Management for chronic disease management services.  Patient has been engaged by a [facility] RN CC.  Our community based plan of care has focused on disease management and community resource support.    Plan:  Continue to follow up and update [facility] RNCC for any additional post hospital care coordination needs.  Of note, [facility] Care Management services does not replace or interfere with any services that are needed or arranged by inpatient [facility] care management team.   For additional questions or referrals please contact:  Charlesetta Shanks, RN BSN CCM [facility]  [phone] business mobile phone Toll free office [phone]  *Concierge Line  [phone] Fax number: [phone] [email or web address]

## 2022-05-01 NOTE — Progress Notes (Signed)
[facility]                                                                               Monico HoarBobby [name], is a 62 y.o. male, DOB - 05/25/1960, QMV:[id number] Admit date - 04/28/2022    Outpatient Primary MD for the patient is Ngetich, Dinah C, NP  LOS - 2  days    Brief summary   [name] is a 62 y.o. 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  CT is negative for acute intracranial abnormality. Labs are notable for normal lactic acid, normal WBC, platelets 60,000, normal potassium, normal serum bicarbonate, and normal BUN. COVID, influenza, and RSV PCR are negative.  Respiratory panel shows rhinovirus positive.   Assessment & Plan    Assessment and Plan:   Acute respiratory failure with hypoxia secondary to rhino virus infection Persistent dyspnea on exertion despite prednisone and duonebs D dimer is negative.  Symptomatic management with cough medication.  Duo nebs prn.  Lactic acid wnl.  Procalcitonin is 2.36.  In view of his persistent DOE requiring 2 lit of [place] oxygen, a CT chest done , shows Mucous plugging, mild consolidations and numerous small pulmonary nodules most pronounced in the lower lungs, slightly worsened when compared with the prior exam, consistent with infection.  Started the patient on IV rocephin and IV azithromycin.    Hypertensive crisis:  BP parameters have improved, but still elevated.  On hydralazine 100 mg TID, amlodipine 10 mg daily, Irbesartan, metoprolol 100 mg BID. Increased the Imdur to 90 mg daily.    ESRD on HD MWF Nephrology on  board.    Chronic thrombocytopenia;  No obvious signs of bleeding.    Anemia of chronic disease:  Monitor.   Headache;  Improving.   Seizures:  On Keppra 250 mg on the days of  HD.   Hyperlipidemia:   On Pravastatin.    Estimated body mass index is 29.59 kg/m as calculated from the following:   Height as of this encounter: 5\' 7"  (1.702 m).   Weight as of this encounter: 85.7 kg.  Code Status: full code.  DVT Prophylaxis:  heparin injection 5,000 Units Start: 05/01/22 1400 SCDs Start: 04/29/22 0223   Level of Care: Level of care: Progressive Family Communication: none at bedside.   Disposition Plan:     Remains inpatient appropriate:  persistent  respiratory symptoms.   Procedures:  CT chest without contrast.    Consultants:   Nephrology.   Antimicrobials:   Anti-infectives (From admission, onward)    None        Medications  Scheduled Meds:  amLODipine  10 mg Oral QPM   doxazosin  8 mg Oral Daily   DULoxetine  30 mg Oral Daily   heparin injection (subcutaneous)  5,000 Units Subcutaneous Q8H   hydrALAZINE  100 mg Oral Q8H   ipratropium-albuterol  3 mL Nebulization Q6H   irbesartan  150 mg Oral Daily   isosorbide mononitrate  60 mg Oral Daily   lanthanum  1,000 mg Oral TID WC   levETIRAcetam  250 mg Oral Q M,W,F-HD   metoprolol tartrate  100 mg Oral BID   pravastatin  40 mg Oral q1800   predniSONE  60 mg Oral QAC breakfast   sodium chloride flush  3 mL Intravenous Q12H   sodium chloride flush  3 mL Intravenous Q12H   traZODone  50 mg Oral QHS   Continuous Infusions:  sodium chloride     PRN Meds:.sodium chloride, acetaminophen, albuterol, benzonatate, fentaNYL (SUBLIMAZE) injection, guaiFENesin-dextromethorphan, hydrALAZINE, ondansetron **OR** ondansetron (ZOFRAN) IV, sodium chloride flush    Subjective:   Kirk [name] was seen and examined today. Persistent sob .  Objective:   Vitals:   05/01/22 0856 05/01/22 1209 05/01/22 1402 05/01/22 1434  BP: (!) 153/60 (!) 165/64 (!) 183/65   Pulse: 72 66    Resp: 18 18  16   Temp: 98.6 F (37 C) 98.3 F (36.8 C)    TempSrc: Oral Oral    SpO2: 95% 95%    Weight:       Height:       No intake or output data in the 24 hours ending 05/01/22 1545  Filed Weights   04/30/22 0411 04/30/22 0740 04/30/22 1159  Weight: 87.5 kg 94.3 kg 85.7 kg     Exam General exam: Appears calm and comfortable  Respiratory system: scattered wheezing, air entry fair, scattered rales.  Cardiovascular system: S1 & S2 heard, RRR. No JVD,  Gastrointestinal system: Abdomen is nondistended, soft and nontender.  Central nervous system: Alert and oriented. No focal neurological deficits. Extremities: Symmetric 5 x 5 power. Skin: No rashes, Psychiatry:  Mood & affect appropriate.    Data Reviewed:  I have personally reviewed following labs and imaging studies   CBC Lab Results  Component Value Date   WBC 3.5 (L) 05/01/2022   RBC 3.38 (L) 05/01/2022   HGB 10.5 (L) 05/01/2022   HCT 32.2 (L) 05/01/2022   MCV 95.3 05/01/2022   MCH 31.1 05/01/2022   PLT 58 (L) 05/01/2022   MCHC 32.6 05/01/2022   RDW 16.9 (H) 05/01/2022   LYMPHSABS 0.7 04/28/2022   MONOABS 0.4 04/28/2022   EOSABS 0.0 04/28/2022   BASOSABS 0.1 04/28/2022     Last metabolic panel Lab Results  Component Value Date   NA 136 05/01/2022   K 4.2 05/01/2022   CL 97 (L) 05/01/2022   CO2 30 05/01/2022   BUN 23 (H) 05/01/2022   CREATININE 8.00 (H) 05/01/2022   GLUCOSE 118 (H) 05/01/2022   GFRNONAA 7 (L) 05/01/2022   GFRAA 4 (L) 07/15/2019   CALCIUM 8.9 05/01/2022   PHOS 4.7 (H) 04/29/2022   PROT 6.7 04/28/2022   ALBUMIN 3.8 04/28/2022   LABGLOB 2.6 05/16/2021   AGRATIO 1.5 05/16/2021   BILITOT 0.9 04/28/2022   ALKPHOS 57 04/28/2022   AST 17 04/28/2022   ALT 13 04/28/2022   ANIONGAP 9 05/01/2022    CBG (last 3)  Recent Labs    04/30/22 2202  GLUCAP 126*      Coagulation Profile: Recent Labs  Lab 04/28/22 1955  INR 1.1      Radiology Studies: CT CHEST WO CONTRAST  Result Date: 05/01/2022 CLINICAL DATA:  Respiratory illness EXAM: CT CHEST WITHOUT CONTRAST TECHNIQUE: Multidetector  CT imaging of the chest was performed following the standard protocol without IV contrast. RADIATION DOSE REDUCTION: This exam was performed according to the departmental dose-optimization program which includes automated exposure control, adjustment of the mA and/or kV  according to patient size and/or use of iterative reconstruction technique. COMPARISON:  Chest CT dated August 01, 2021 FINDINGS: Cardiovascular: Cardiomegaly. Trace pericardial effusions. Normal caliber thoracic aorta with moderate atherosclerotic disease. Severe coronary artery calcifications. Mediastinum/Nodes: Esophagus and thyroid unremarkable. No pathologically enlarged nodes seen in the chest. Lungs/Pleura: Central airways are patent. Mucous plugging, mild consolidations and numerous small pulmonary nodules most pronounced in the lower lungs, slightly worsened when compared with the prior exam. Associated lower lobe dendriform pulmonary ossification, similar to prior. No pleural effusion or pneumothorax. Upper Abdomen: Cholecystectomy clips. 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 No aggressive appearing osseous lesions. IMPRESSION: 1. Mucous plugging, mild consolidations and numerous small pulmonary nodules most pronounced in the lower lungs, slightly worsened when compared with the prior exam. Findings are likely due to infection or aspiration. 2. Severe coronary artery calcifications. 3. Aortic Atherosclerosis (ICD10-I70.0). Electronically Signed   By: Allegra Lai M.D.   On: 05/01/2022 13:02   EEG adult  Result Date: 04/30/2022 Charlsie Quest, MD     04/30/2022  4:00 PM Patient Name: GLENARD NEWMANN MRN: [id number] Epilepsy Attending: Charlsie Quest Referring Physician/Provider: Kathlen Mody, MD Date: 04/30/2022 Duration: 22.36 mins Patient history: 62yo M getting EEG to evaluate for seizure. Level of alertness: Awake, asleep AEDs during EEG study: LEV Technical aspects: This EEG study was done  with scalp electrodes positioned according to the 10-20 International system of electrode placement. Electrical activity was reviewed with band pass filter of 1-70Hz , sensitivity of 7 uV/mm, display speed of 10mm/sec with a 60Hz  notched filter applied as appropriate. EEG data were recorded continuously and digitally stored.  Video monitoring was available and reviewed as appropriate. Description: The posterior dominant rhythm consists of 9-10 Hz activity of moderate voltage (25-35 uV) seen predominantly in posterior head regions, symmetric and reactive to eye opening and eye closing. Sleep was characterized by vertex waves, sleep spindles (12 to 14 Hz), maximal frontocentral region.  Hyperventilation and photic stimulation were not performed.   IMPRESSION: This study is within normal limits. No seizures or epileptiform discharges were seen throughout the recording. A normal interictal EEG does not exclude the diagnosis of epilepsy. Priyanka Louie Boston M.D. [facility] 05/01/2022, 3:45 PM  Available via Epic secure chat 7am-7pm After 7 pm, please refer to night coverage provider listed on amion.

## 2022-05-01 NOTE — Progress Notes (Addendum)
Pt called requesting cough medicine and stating he feels like his CPAP "is not blowing hard enough". When I entered room pt sitting up on side of bed, CPAP on but O2 tubing not connected to machine. Sats 87% no obvious distress noted. Pt previously on 3L/.Pt states he would like his 2am neb treatment as well. Lungs diminished but clear. Respiratory called to check CPAP, PRN cough med and duo neb given.

## 2022-05-01 NOTE — Evaluation (Signed)
Physical Therapy Evaluation Patient Details Name: Kirk [name] MRN: [id number] DOB: Oct 15, 1960 Today's Date: 05/01/2022  History of Present Illness  Pt is 62 year old presented to [facility]  [facility]/22 for SOB, cough, and headache. BP 224/93. SpO2 89%. Adm with hypertensive crisis and acute respiratory failure. PMH - ESRD on HD, failed kidney transplant, htn, anxiety, OSA, SDH, seizure  Clinical Impression  Pt presents to PT with history of falls at home on a regular basis. Pt has received follow up PT in the past and does not want PT after DC. Pt with improved gait when using a rollator today and is agreeable to having one for home. He is also currently requiring supplemental O2 to maintain SpO2 >88% with activity. Will follow acutely while he is here.        Recommendations for follow up therapy are one component of a multi-disciplinary discharge planning process, led by the attending physician.  Recommendations may be updated based on patient status, additional functional criteria and insurance authorization.  Follow Up Recommendations       Assistance Recommended at Discharge PRN  Patient can return home with the following  Help with stairs or ramp for entrance    Equipment Recommendations Rollator (4 wheels)  Recommendations for Other Services       Functional Status Assessment Patient has had a recent decline in their functional status and demonstrates the ability to make significant improvements in function in a reasonable and predictable amount of time.     Precautions / Restrictions Precautions Precautions: Fall      Mobility  Bed Mobility Overal bed mobility: Modified Independent                  Transfers Overall transfer level: Needs assistance Equipment used: Straight cane, Rollator (4 wheels) Transfers: Sit to/from Stand Sit to Stand: Supervision           General transfer comment: supervision for safety    Ambulation/Gait Ambulation/Gait  assistance: Min guard, Supervision Gait Distance (Feet): 140 Feet (140' x 1, 30' x 1) Assistive device: Straight cane, Rollator (4 wheels) Gait Pattern/deviations: Step-through pattern, Decreased stride length, Drifts right/left, Trunk flexed Gait velocity: decr Gait velocity interpretation: 1.31 - 2.62 ft/sec, indicative of limited community ambulator   General Gait Details: Unsteady gait with cane requiring min guard for safety. With rollator improved stability and only supervision for safety  Stairs            Wheelchair Mobility    Modified Rankin (Stroke Patients Only)       Balance Overall balance assessment: Needs assistance Sitting-balance support: No upper extremity supported, Feet supported Sitting balance-Leahy Scale: Good     Standing balance support: No upper extremity supported, During functional activity Standing balance-Leahy Scale: Fair                               Pertinent Vitals/Pain Pain Assessment Pain Assessment: Faces Faces Pain Scale: Hurts little more Pain Location: back Pain Descriptors / Indicators: Discomfort Pain Intervention(s): Limited activity within patient's tolerance    Home Living Family/patient expects to be discharged to:: Private residence Living Arrangements: Spouse/significant other;Children Available Help at Discharge: Family;Available 24 hours/day Type of Home: House Home Access: Stairs to enter Entrance Stairs-Rails: Left Entrance Stairs-Number of Steps: 10   Home Layout: One level Home Equipment: Cane - single point      Prior Function Prior Level of Function : Independent/Modified  Independent;History of Falls (last six months)             Mobility Comments: Uses cane at times ADLs Comments: sponge bathes     Hand Dominance   Dominant Hand: Right    Extremity/Trunk Assessment   Upper Extremity Assessment Upper Extremity Assessment: Overall WFL for tasks assessed    Lower Extremity  Assessment Lower Extremity Assessment: Generalized weakness       Communication   Communication: HOH  Cognition Arousal/Alertness: Awake/alert Behavior During Therapy: WFL for tasks assessed/performed Overall Cognitive Status: Within Functional Limits for tasks assessed                                          General Comments General comments (skin integrity, edema, etc.): SpO2 86% on RA with amb. 94% on 4L with amb    Exercises     Assessment/Plan    PT Assessment Patient needs continued PT services  PT Problem List Decreased strength;Decreased balance;Decreased mobility       PT Treatment Interventions DME instruction;Gait training;Stair training;Functional mobility training;Therapeutic activities;Therapeutic exercise;Balance training;Patient/family education    PT Goals (Current goals can be found in the Care Plan section)  Acute Rehab PT Goals Patient Stated Goal: return home PT Goal Formulation: With patient Time For Goal Achievement: 05/15/22 Potential to Achieve Goals: Good    Frequency Min 1X/week     Co-evaluation               AM-PAC PT "6 Clicks" Mobility  Outcome Measure Help needed turning from your back to your side while in a flat bed without using bedrails?: None Help needed moving from lying on your back to sitting on the side of a flat bed without using bedrails?: None Help needed moving to and from a bed to a chair (including a wheelchair)?: A Little Help needed standing up from a chair using your arms (e.g., wheelchair or bedside chair)?: A Little Help needed to walk in hospital room?: A Little Help needed climbing 3-5 steps with a railing? : A Little 6 Click Score: 20    End of Session Equipment Utilized During Treatment: Gait belt;Oxygen Activity Tolerance: Patient tolerated treatment well Patient left: in bed;with call bell/phone within reach;with nursing/sitter in room   PT Visit Diagnosis: Unsteadiness on feet  (R26.81);Other abnormalities of gait and mobility (R26.89);Muscle weakness (generalized) (M62.81);History of falling (Z91.81)    Time: 4562-5638 PT Time Calculation (min) (ACUTE ONLY): 16 min   Charges:   PT Evaluation $PT Eval Low Complexity: 1 Low          [facility] PT Acute Rehabilitation Services Office [phone]   Angelina Ok [facility] 05/01/2022, 11:20 AM

## 2022-05-02 DIAGNOSIS — N186 End stage renal disease: Secondary | ICD-10-CM | POA: Diagnosis not present

## 2022-05-02 DIAGNOSIS — I169 Hypertensive crisis, unspecified: Secondary | ICD-10-CM | POA: Diagnosis not present

## 2022-05-02 DIAGNOSIS — R569 Unspecified convulsions: Secondary | ICD-10-CM | POA: Diagnosis not present

## 2022-05-02 DIAGNOSIS — D696 Thrombocytopenia, unspecified: Secondary | ICD-10-CM | POA: Diagnosis not present

## 2022-05-02 LAB — BASIC METABOLIC PANEL
Anion gap: 9 (ref 5–15)
BUN: 40 mg/dL — ABNORMAL HIGH (ref 6–20)
CO2: 26 mmol/L (ref 22–32)
Calcium: 9.1 mg/dL (ref 8.9–10.3)
Chloride: 98 mmol/L (ref 98–111)
Creatinine, Ser: 10.28 mg/dL — ABNORMAL HIGH (ref 0.61–1.24)
GFR, Estimated: 5 mL/min — ABNORMAL LOW (ref >60)
Glucose, Bld: 102 mg/dL — ABNORMAL HIGH (ref 70–99)
Potassium: 4.2 mmol/L (ref 3.5–5.1)
Sodium: 133 mmol/L — ABNORMAL LOW (ref 135–145)

## 2022-05-02 LAB — CBC WITH DIFFERENTIAL/PLATELET
Abs Immature Granulocytes: 0.02 10*3/uL (ref 0.00–0.07)
Basophils Absolute: 0 10*3/uL (ref 0.0–0.1)
Basophils Relative: 1 %
Eosinophils Absolute: 0 10*3/uL (ref 0.0–0.5)
Eosinophils Relative: 0 %
HCT: 31.4 % — ABNORMAL LOW (ref 39.0–52.0)
Hemoglobin: 10.2 g/dL — ABNORMAL LOW (ref 13.0–17.0)
Immature Granulocytes: 1 %
Lymphocytes Relative: 13 %
Lymphs Abs: 0.5 10*3/uL — ABNORMAL LOW (ref 0.7–4.0)
MCH: 30.8 pg (ref 26.0–34.0)
MCHC: 32.5 g/dL (ref 30.0–36.0)
MCV: 94.9 fL (ref 80.0–100.0)
Monocytes Absolute: 0.4 10*3/uL (ref 0.1–1.0)
Monocytes Relative: 11 %
Neutro Abs: 3 10*3/uL (ref 1.7–7.7)
Neutrophils Relative %: 74 %
Platelets: 59 10*3/uL — ABNORMAL LOW (ref 150–400)
RBC: 3.31 MIL/uL — ABNORMAL LOW (ref 4.22–5.81)
RDW: 16.7 % — ABNORMAL HIGH (ref 11.5–15.5)
WBC: 4 10*3/uL (ref 4.0–10.5)
nRBC: 0 % (ref 0.0–0.2)

## 2022-05-02 LAB — CULTURE, BLOOD (ROUTINE X 2)
Culture: NO GROWTH
Special Requests: ADEQUATE

## 2022-05-02 MED ORDER — METHYLPREDNISOLONE SODIUM SUCC 125 MG IJ SOLR
60.0000 mg | Freq: Every day | INTRAMUSCULAR | Status: DC
  Administered 2022-05-02 – 2022-05-05 (×4): 60 mg via INTRAVENOUS
  Filled 2022-05-02 (×4): qty 2

## 2022-05-02 MED ORDER — DM-GUAIFENESIN ER 30-600 MG PO TB12
1.0000 | ORAL_TABLET | Freq: Two times a day (BID) | ORAL | Status: DC
  Administered 2022-05-02 – 2022-05-09 (×13): 1 via ORAL
  Filled 2022-05-02 (×16): qty 1

## 2022-05-02 MED ORDER — IPRATROPIUM-ALBUTEROL 0.5-2.5 (3) MG/3ML IN SOLN: 3.0000 mL | Freq: Four times a day (QID) | RESPIRATORY_TRACT | Status: DC

## 2022-05-02 MED ORDER — HEPARIN SODIUM (PORCINE) 1000 UNIT/ML DIALYSIS: 3000.0000 [IU] | Freq: Once | INTRAMUSCULAR | Status: AC

## 2022-05-02 MED ORDER — IPRATROPIUM-ALBUTEROL 0.5-2.5 (3) MG/3ML IN SOLN
3.0000 mL | Freq: Four times a day (QID) | RESPIRATORY_TRACT | Status: DC
  Administered 2022-05-02 – 2022-05-05 (×8): 3 mL via RESPIRATORY_TRACT
  Filled 2022-05-02 (×10): qty 3

## 2022-05-02 MED ORDER — HEPARIN SODIUM (PORCINE) 1000 UNIT/ML IJ SOLN
INTRAMUSCULAR | Status: AC
  Administered 2022-05-02: 3000 [IU] via INTRAVENOUS_CENTRAL
  Filled 2022-05-02: qty 4

## 2022-05-02 NOTE — Procedures (Signed)
I was present at this dialysis session. I have reviewed the session itself and made appropriate changes.   Cont to have some dyspnea and cough.  2L UF goal.  3K bath.  Should get close to his EDW.    On pred / nebs for his viral PNA  Filed Weights   04/30/22 1159 05/02/22 0430 05/02/22 0820  Weight: 85.7 kg 89 kg 88.9 kg    Recent Labs  Lab 04/29/22 0602 04/30/22 0203 05/02/22 0210  NA 141   < > 133*  K 3.9   < > 4.2  CL 95*   < > 98  CO2 29   < > 26  GLUCOSE 104*   < > 102*  BUN 16   < > 40*  CREATININE 8.76*   < > 10.28*  CALCIUM 8.7*   < > 9.1  PHOS 4.7*  --   --    < > = values in this interval not displayed.    Recent Labs  Lab 04/28/22 1955 04/29/22 0602 04/30/22 0203 05/01/22 0113 05/02/22 0210  WBC 5.4   < > 4.2 3.5* 4.0  NEUTROABS 4.2  --   --   --  3.0  HGB 11.2*   < > 10.8* 10.5* 10.2*  HCT 35.3*   < > 32.9* 32.2* 31.4*  MCV 97.8   < > 94.3 95.3 94.9  PLT 60*   < > 55* 58* 59*   < > = values in this interval not displayed.    Scheduled Meds:  amLODipine  10 mg Oral QPM   doxazosin  8 mg Oral Daily   DULoxetine  30 mg Oral Daily   heparin injection (subcutaneous)  5,000 Units Subcutaneous Q8H   hydrALAZINE  100 mg Oral Q8H   ipratropium-albuterol  3 mL Nebulization Q6H   irbesartan  150 mg Oral Daily   isosorbide mononitrate  90 mg Oral Daily   lanthanum  1,000 mg Oral TID WC   levETIRAcetam  250 mg Oral Q M,W,F-HD   metoprolol tartrate  100 mg Oral BID   pravastatin  40 mg Oral q1800   predniSONE  40 mg Oral QAC breakfast   sodium chloride flush  3 mL Intravenous Q12H   sodium chloride flush  3 mL Intravenous Q12H   traZODone  50 mg Oral QHS   Continuous Infusions:  sodium chloride     azithromycin Stopped (05/01/22 2019)   cefTRIAXone (ROCEPHIN)  IV Stopped (05/01/22 1851)   PRN Meds:.sodium chloride, acetaminophen, albuterol, benzonatate, fentaNYL (SUBLIMAZE) injection, guaiFENesin-dextromethorphan, hydrALAZINE, ondansetron **OR**  ondansetron (ZOFRAN) IV, sodium chloride flush   Sabra Heck  MD 05/02/2022, 9:41 AM

## 2022-05-02 NOTE — TOC Initial Note (Addendum)
Transition of Care [facility]) - Initial/Assessment Note    Patient Details  Name: Kirk [name] MRN: [id number] Date of Birth: 12/21/1960  Transition of Care [facility]) CM/SW Contact:    Gala Lewandowsky, RN Phone Number: 05/02/2022, 2:51 PM  Clinical Narrative:  Patient was discussed in progression rounds. Patient presented for shortness of breath, cough, and headache. PTA patient was from home with spouse. Patient has insurance and PCP.  Patient has DME cane and CPAP at home. No home health needs identified at this time. Patient asked Case Manager to order a rollator- order submitted to [employer]- will ne delivered to the room. No further needs identified at this time. Case Manager will continue to follow for additional transition of care needs as he progresses.               7116 05-02-22 Patient received a rolling walker in Feb 2023. Spouse to call [employer] back to see if they want to pay out of pocket for the rollator.  Expected Discharge Plan: Home/Self Care Barriers to Discharge: Continued Medical Work up   Patient Goals and CMS Choice Patient states their goals for this hospitalization and ongoing recovery are:: patient wants to return home.    Expected Discharge Plan and Services In-house Referral: NA Discharge Planning Services: CM Consult Post Acute Care Choice: NA Living arrangements for the past 2 months: Single Family Home                 DME Arranged: Walker rolling with seat DME Agency: [employer] Date DME Agency Contacted: 05/02/22 Time DME Agency Contacted: 1451 Representative spoke with at DME Agency: Belenda Cruise. HH Arranged: NA    Prior Living Arrangements/Services Living arrangements for the past 2 months: Single Family Home Lives with:: Spouse Patient language and need for interpreter reviewed:: Yes Do you feel safe going back to the place where you live?: Yes      Need for Family Participation in Patient Care: Yes (Comment) Care giver support system in place?:  Yes (comment) Current home services: DME (cpap and cane.) Criminal Activity/Legal Involvement Pertinent to Current Situation/Hospitalization: No - Comment as needed  Activities of Daily Living Home Assistive Devices/Equipment: None ADL Screening (condition at time of admission) Patient's cognitive ability adequate to safely complete daily activities?: Yes Is the patient deaf or have difficulty hearing?: Yes (HOH) Does the patient have difficulty seeing, even when wearing glasses/contacts?: No Does the patient have difficulty concentrating, remembering, or making decisions?: No Patient able to express need for assistance with ADLs?: Yes Does the patient have difficulty dressing or bathing?: Yes Independently performs ADLs?: Yes (appropriate for developmental age) Does the patient have difficulty walking or climbing stairs?: No Weakness of Legs: None Weakness of Arms/Hands: None  Permission Sought/Granted Permission sought to share information with : Family Supports, Case Production designer, theatre/television/film, Photographer granted to share info w AGENCY: [employer]        Emotional Assessment Appearance:: Appears stated age Attitude/Demeanor/Rapport: Engaged Affect (typically observed): Appropriate Orientation: : Oriented to  Time, Oriented to Place, Oriented to Self Alcohol / Substance Use: Not Applicable Psych Involvement: No (comment)  Admission diagnosis:  Hypertensive crisis [I16.9] Hypertensive urgency [I16.0] Patient Active Problem List   Diagnosis Date Noted   Hypertensive crisis 04/29/2022   Cerebral aneurysm 11/30/2021   Subdural hematoma 11/30/2021   Traumatic subdural hematoma (SDH) 11/21/2021   SDH (subdural hematoma) 11/08/2021   Seizure 11/08/2021   Chronic left shoulder pain 09/21/2021  Chronic right shoulder pain 09/21/2021   Cough with hemoptysis 08/01/2021   Preop respiratory exam 03/02/2021   Intractable nausea and vomiting 01/09/2021   Abnormal CT  of the chest 05/24/2020   Rotator cuff syndrome of right shoulder 11/17/2019   Chronic systolic heart failure 09/25/2018   Fluid overload 09/17/2018   Hypertensive urgency 09/17/2018   Right knee pain 06/02/2018   Right tibial fracture 06/02/2018   Cigarette smoker 07/11/2017   Anxiety 05/30/2017   Hypokalemia    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   Shunt malfunction 07/03/2016   Need for hepatitis C screening test 06/29/2015   Myalgia 05/17/2015   Secondary central sleep apnea 12/09/2014   Obesity hypoventilation syndrome 12/09/2014   Narcotic drug use 12/09/2014   Hypersomnia with sleep apnea 12/09/2014   SCC (squamous cell carcinoma), arm 11/09/2014   Mild persistent chronic asthma without complication 09/18/2014   ESRD on dialysis 09/09/2014   Essential hypertension 09/09/2014   HLD (hyperlipidemia) 09/09/2014   Pancytopenia 04/28/2014   Thrombocytopenia 04/10/2014   Acute respiratory failure with hypoxia    Fungal dermatitis 03/17/2014   S/p nephrectomy 09/17/2013   Anemia 09/03/2012   Uncontrolled hypertension    Tension headache    Memory loss    Edema    Thoracic or lumbosacral neuritis or radiculitis 03/27/2012   Nerve root pain 03/27/2012   History of kidney transplant 09/10/2011   Type 2 diabetes mellitus with diabetic nephropathy, without long-term current use of insulin 08/30/2011   Hearing loss 08/16/2011   Obesity 08/16/2011   OSA on CPAP 08/05/2011   GIB (gastrointestinal bleeding) 10/28/2007   PCP:  Caesar Bookman, NP Pharmacy:   [phone] [place], [place] - [address] AT CORNER OF [facility] [address] [place] [address] Phone: [phone] Fax: [phone]  [facility] - [place], [place] - [address], [address] [place] [address] Phone: [phone] Fax: [phone]  [facility] - [place], [place] - [address] [facility] AT  Portal to Registered [facility] Sites [address] [place] [address] Phone: [phone] Fax: [phone]  Social Determinants of Health (SDOH) Social History: SDOH Screenings   Food Insecurity: No Food Insecurity (04/29/2022)  Housing: Low Risk  (04/29/2022)  Transportation Needs: No Transportation Needs (04/29/2022)  Utilities: Not At Risk (04/29/2022)  Alcohol Screen: Low Risk  (02/03/2018)  Depression (PHQ2-9): Low Risk  (02/28/2022)  Financial Resource Strain: Medium Risk (11/02/2021)  Physical Activity: Insufficiently Active (06/25/2017)  Social Connections: Moderately Integrated (06/25/2017)  Stress: No Stress Concern Present (06/25/2017)  Tobacco Use: Medium Risk (04/28/2022)   SDOH Interventions:     Readmission Risk Interventions     No data to display

## 2022-05-02 NOTE — Progress Notes (Signed)
Kirk [name] MWU:[id number] DOB: January 04, 1961 DOA: 04/28/2022 PCP: Caesar Bookman, NP   Subj: Kirk [name] is a 62 y.o. 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  CT is negative for acute intracranial abnormality. Labs are notable for normal lactic acid, normal WBC, platelets 60,000, normal potassium, normal serum bicarbonate, and normal BUN. COVID, influenza, and RSV PCR are negative.  Respiratory panel shows rhinovirus positive.    Obj: A/O x 4, states not on home O2.  Positive chest tightness/discomfort.  Positive productive cough (white).  Believes he caught virus at the HD center.   Objective: VITAL SIGNS: Temp: 98.9 F (37.2 C) (04/10 0430) Temp Source: Oral (04/10 0430) BP: 193/77 (04/10 0430) Pulse Rate: 70 (04/10 0430)   VENTILATOR SETTINGS: **   Intake/Output Summary (Last 24 hours) at 05/02/2022 0703 Last data filed at 05/02/2022 7253 Gross per 24 hour  Intake 460.69 ml  Output --  Net 460.69 ml     Exam: Physical Exam:  General: A/O x 4, No acute respiratory distress Eyes: negative scleral hemorrhage, negative anisocoria, negative icterus ENT: Negative Runny nose, negative gingival bleeding, Neck:  Negative scars, masses, torticollis, lymphadenopathy, JVD Lungs: diffuse decreased breath sounds with positive rhonchi, positive cough with exertion.  Positive expiratory wheeze Cardiovascular: Regular rate and rhythm without murmur gallop or rub normal S1 and S2 Abdomen: negative abdominal pain, nondistended, positive soft, bowel sounds, no rebound, no ascites, no appreciable mass Extremities: No significant cyanosis, clubbing, or edema bilateral lower extremities Skin: Negative rashes, lesions, ulcers Psychiatric:  Negative depression, negative anxiety, negative fatigue, negative mania   Central nervous system:  Cranial nerves II through XII intact, tongue/uvula midline, all extremities muscle strength 5/5, sensation intact throughout,negative dysarthria, negative expressive aphasia, negative receptive aphasia.    Mobility Assessment (last 72 hours)     Mobility Assessment     Row Name 05/01/22 1100 05/01/22 0830 04/30/22 1400 04/30/22 0018 04/29/22 2200   Does patient have an order for bedrest or is patient medically unstable -- No - Continue assessment No - Continue assessment No - Continue assessment No - Continue assessment   What is the highest level of mobility based on the progressive mobility assessment? Level 5 (Walks with assist in room/hall) - Balance while stepping forward/back and can walk in room with assist - Complete Level 5 (Walks with assist in room/hall) - Balance while stepping forward/back and can walk in room with assist - Complete Level 6 (Walks independently in room and hall) - Balance while walking in room without assist - Complete Level 6 (Walks independently in room and hall) - Balance while walking in room without assist - Complete Level 6 (Walks independently in room and hall) - Balance while walking in room without assist - Complete              DVT prophylaxis:  Code Status: Full Family Communication:  Status is: Inpatient    Dispo: The patient is from: Home              Anticipated d/c is to: Home              Anticipated d/c date is: 3 days              Patient currently is not medically stable to d/c.    Procedures/Significant Events:  Consultants:     Cultures   Antimicrobials:    Assessment & Plan: Covid vaccination;   Principal Problem:   Hypertensive crisis Active Problems:   Seizure   Thrombocytopenia   ESRD on dialysis   Acute respiratory failure with hypoxia   Secondary central sleep apnea  Acute respiratory failure with hypoxia -Continuous pulse ox - Titrate O2 to maintain SpO2> 93% -Obtain  ambulatory SpO2 in a.m.  Rhinovirus pneumonia -DuoNeb QID -Solu-Medrol 60 mg daily - Incentive spirometry - Flutter valve - Mucinex DM BID  OSA - CPAP per respiratory therapy  HTN crisis -Amlodipine 10 mg daily -Metoprolol 100 mg BID -Resolved most likely secondary to requiring HD, and rhinovirus pneumonia  ESRD on HD M/W/F -HD per nephrology  Chronic thrombocytopenia -Monitor closely, currently no sign of acute bleed  Anemia of chronic disease -Anemia panel pending  Seizures/headache -Keppra 250 mg M/W/F with HD  HLD -Pravastatin 40 mg daily - Lipid panel pending  Obesity (BMI 30.7 kg/m) -Follow-up with PCP    Mobility Assessment (last 72 hours)     Mobility Assessment     Row Name 05/01/22 1100 05/01/22 0830 04/30/22 1400 04/30/22 0018 04/29/22 2200   Does patient have an order for bedrest or is patient medically unstable -- No - Continue assessment No - Continue assessment No - Continue assessment No - Continue assessment   What is the highest level of mobility based on the progressive mobility assessment? Level 5 (Walks with assist in room/hall) - Balance while stepping forward/back and can walk in room with assist - Complete Level 5 (Walks with assist in room/hall) - Balance while stepping forward/back and can walk in room with assist - Complete Level 6 (Walks independently in room and hall) - Balance while walking in room without assist - Complete Level 6 (Walks independently in room and hall) - Balance while walking in room without assist - Complete Level 6 (Walks independently in room and hall) - Balance while walking in room without assist - Complete                 Time: 50 minutes         Care during the described time interval was provided by me .  I have reviewed this patient's available data, including medical history, events of note, physical examination, and all test results as part of my evaluation.

## 2022-05-02 NOTE — Progress Notes (Signed)
Physical Therapy Treatment Patient Details Name: Kirk [name] MRN: [id number] DOB: [birth year] Today's Date: 05/02/2022   History of Present Illness Pt is 62 year old presented to [facility] on  04/28/22 for SOB, cough, and headache. BP 224/93. SpO2 89%. Adm with hypertensive crisis and acute respiratory failure. PMH - ESRD on HD, failed kidney transplant, htn, anxiety, OSA, SDH, seizure    PT Comments    Pt tolerated treatment well today. Pt was able to ambulate in hallway with rollator at supervision level however required constant cues for locking rollator brakes. Pt would benefit from 1 more session to navigate stairs and reinforce rollator safety prior to DC. No change in DC/DME recs at this time.    Recommendations for follow up therapy are one component of a multi-disciplinary discharge planning process, led by the attending physician.  Recommendations may be updated based on patient status, additional functional criteria and insurance authorization.  Follow Up Recommendations       Assistance Recommended at Discharge PRN  Patient can return home with the following Help with stairs or ramp for entrance   Equipment Recommendations  Rollator (4 wheels)    Recommendations for Other Services       Precautions / Restrictions Precautions Precautions: Fall     Mobility  Bed Mobility               General bed mobility comments: Pt received seated EOB    Transfers Overall transfer level: Independent Equipment used: None Transfers: Sit to/from Stand Sit to Stand: Independent                Ambulation/Gait Ambulation/Gait assistance: Supervision Gait Distance (Feet): 600 Feet Assistive device: Rollator (4 wheels) Gait Pattern/deviations: Step-through pattern, Decreased stride length, Drifts right/left, Trunk flexed Gait velocity: decr Gait velocity interpretation: 1.31 - 2.62 ft/sec, indicative of limited community ambulator   General Gait Details: Pt was overall  steady. Pt required 3 seated rest breaks with rollator. Pt required constant cues for locking rollator brakes.   Stairs             Wheelchair Mobility    Modified Rankin (Stroke Patients Only)       Balance Overall balance assessment: Needs assistance Sitting-balance support: No upper extremity supported, Feet supported Sitting balance-Leahy Scale: Good     Standing balance support: No upper extremity supported, During functional activity Standing balance-Leahy Scale: Good Standing balance comment: Pt able to ambulate in room with no AD and don pants while standing                            Cognition Arousal/Alertness: Awake/alert Behavior During Therapy: WFL for tasks assessed/performed Overall Cognitive Status: Within Functional Limits for tasks assessed                                          Exercises      General Comments General comments (skin integrity, edema, etc.): SpO2 remained above 94% on 4L during session      Pertinent Vitals/Pain Pain Assessment Pain Assessment: No/denies pain    Home Living                          Prior Function            PT Goals (current goals can now  be found in the care plan section) Progress towards PT goals: Progressing toward goals    Frequency    Min 1X/week      PT Plan Current plan remains appropriate    Co-evaluation              AM-PAC PT "6 Clicks" Mobility   Outcome Measure  Help needed turning from your back to your side while in a flat bed without using bedrails?: None Help needed moving from lying on your back to sitting on the side of a flat bed without using bedrails?: None Help needed moving to and from a bed to a chair (including a wheelchair)?: A Little Help needed standing up from a chair using your arms (e.g., wheelchair or bedside chair)?: A Little Help needed to walk in hospital room?: A Little Help needed climbing 3-5 steps with a  railing? : A Little 6 Click Score: 20    End of Session Equipment Utilized During Treatment: Gait belt;Oxygen Activity Tolerance: Patient tolerated treatment well Patient left: in bed;with call bell/phone within reach Nurse Communication: Mobility status PT Visit Diagnosis: Unsteadiness on feet (R26.81);Other abnormalities of gait and mobility (R26.89);Muscle weakness (generalized) (M62.81);History of falling (Z91.81)     Time: 7341-9379 PT Time Calculation (min) (ACUTE ONLY): 16 min  Charges:  $Gait Training: 8-22 mins                     Shela Nevin, PT, DPT Acute Rehab Services [phone]    [name] 05/02/2022, 3:59 PM

## 2022-05-02 NOTE — Progress Notes (Signed)
   05/02/22 1215  Vitals  Temp 98.5 F (36.9 C)  Pulse Rate 83  Resp (!) 24  BP (!) 174/76  SpO2 99 %  Oxygen Therapy  Patient Activity (if Appropriate) In bed  Post Treatment  Dialyzer Clearance Clear  Duration of HD Treatment -hour(s) 3.5 hour(s)  Hemodialysis Intake (mL) 0 mL  Liters Processed 84  Fluid Removed (mL) 2000 mL  Tolerated HD Treatment Yes  AVG/AVF Arterial Site Held (minutes) 7 minutes  AVG/AVF Venous Site Held (minutes) 7 minutes   Received patient in bed to unit.  Alert and oriented.  Informed consent signed and in chart.   TX duration:3.5  Patient tolerated well.  Transported back to the room  Alert, without acute distress.  Hand-off given to patient's nurse.   Access used: LFA AVF Access issues: none  Total UF removed: 2L Medication(s) given: none    [facility]

## 2022-05-02 NOTE — Progress Notes (Signed)
PT Cancellation Note  Patient Details Name: MANOLO SEILER MRN: [id number] DOB: 10-Mar-1960   Cancelled Treatment:    Reason Eval/Treat Not Completed: Patient at procedure or test/unavailable (Pt off the floor at HD. Will try again later if time permits.)   [name] 05/02/2022, 8:53 AM

## 2022-05-03 DIAGNOSIS — B348 Other viral infections of unspecified site: Secondary | ICD-10-CM

## 2022-05-03 DIAGNOSIS — D696 Thrombocytopenia, unspecified: Secondary | ICD-10-CM | POA: Diagnosis not present

## 2022-05-03 DIAGNOSIS — I169 Hypertensive crisis, unspecified: Secondary | ICD-10-CM | POA: Diagnosis not present

## 2022-05-03 DIAGNOSIS — R569 Unspecified convulsions: Secondary | ICD-10-CM | POA: Diagnosis not present

## 2022-05-03 DIAGNOSIS — N186 End stage renal disease: Secondary | ICD-10-CM | POA: Diagnosis not present

## 2022-05-03 LAB — CBC WITH DIFFERENTIAL/PLATELET
Abs Immature Granulocytes: 0.02 10*3/uL (ref 0.00–0.07)
Basophils Absolute: 0 10*3/uL (ref 0.0–0.1)
Basophils Relative: 0 %
Eosinophils Absolute: 0 10*3/uL (ref 0.0–0.5)
Eosinophils Relative: 0 %
HCT: 31.5 % — ABNORMAL LOW (ref 39.0–52.0)
Hemoglobin: 10.4 g/dL — ABNORMAL LOW (ref 13.0–17.0)
Immature Granulocytes: 1 %
Lymphocytes Relative: 9 %
Lymphs Abs: 0.3 10*3/uL — ABNORMAL LOW (ref 0.7–4.0)
MCH: 31.5 pg (ref 26.0–34.0)
MCHC: 33 g/dL (ref 30.0–36.0)
MCV: 95.5 fL (ref 80.0–100.0)
Monocytes Absolute: 0.1 10*3/uL (ref 0.1–1.0)
Monocytes Relative: 3 %
Neutro Abs: 3.1 10*3/uL (ref 1.7–7.7)
Neutrophils Relative %: 87 %
Platelets: 69 10*3/uL — ABNORMAL LOW (ref 150–400)
RBC: 3.3 MIL/uL — ABNORMAL LOW (ref 4.22–5.81)
RDW: 16.5 % — ABNORMAL HIGH (ref 11.5–15.5)
WBC: 3.5 10*3/uL — ABNORMAL LOW (ref 4.0–10.5)
nRBC: 0 % (ref 0.0–0.2)

## 2022-05-03 LAB — COMPREHENSIVE METABOLIC PANEL
ALT: 13 U/L (ref 0–44)
AST: 13 U/L — ABNORMAL LOW (ref 15–41)
Albumin: 3.3 g/dL — ABNORMAL LOW (ref 3.5–5.0)
Alkaline Phosphatase: 49 U/L (ref 38–126)
Anion gap: 11 (ref 5–15)
BUN: 28 mg/dL — ABNORMAL HIGH (ref 6–20)
CO2: 26 mmol/L (ref 22–32)
Calcium: 9.3 mg/dL (ref 8.9–10.3)
Chloride: 97 mmol/L — ABNORMAL LOW (ref 98–111)
Creatinine, Ser: 7.61 mg/dL — ABNORMAL HIGH (ref 0.61–1.24)
GFR, Estimated: 8 mL/min — ABNORMAL LOW (ref >60)
Glucose, Bld: 145 mg/dL — ABNORMAL HIGH (ref 70–99)
Potassium: 4.7 mmol/L (ref 3.5–5.1)
Sodium: 134 mmol/L — ABNORMAL LOW (ref 135–145)
Total Bilirubin: 0.7 mg/dL (ref 0.3–1.2)
Total Protein: 6.2 g/dL — ABNORMAL LOW (ref 6.5–8.1)

## 2022-05-03 LAB — MAGNESIUM: Magnesium: 2 mg/dL (ref 1.7–2.4)

## 2022-05-03 LAB — PHOSPHORUS: Phosphorus: 2.9 mg/dL (ref 2.5–4.6)

## 2022-05-03 LAB — CULTURE, BLOOD (ROUTINE X 2)

## 2022-05-03 MED ORDER — SODIUM CHLORIDE 3 % IN NEBU
4.0000 mL | INHALATION_SOLUTION | Freq: Two times a day (BID) | RESPIRATORY_TRACT | Status: DC
  Filled 2022-05-03: qty 4

## 2022-05-03 MED ORDER — SODIUM CHLORIDE 3 % IN NEBU
4.0000 mL | INHALATION_SOLUTION | Freq: Two times a day (BID) | RESPIRATORY_TRACT | Status: AC
  Administered 2022-05-03 – 2022-05-07 (×6): 4 mL via RESPIRATORY_TRACT
  Filled 2022-05-03 (×9): qty 4

## 2022-05-03 NOTE — Progress Notes (Signed)
Mobility Specialist Progress Note:   05/03/22 1410  Mobility  Activity Ambulated with assistance in hallway  Level of Assistance Standby assist, set-up cues, supervision of patient - no hands on  Assistive Device Four wheel walker  Distance Ambulated (ft) 200 ft  Activity Response Tolerated fair  Mobility Referral Yes  $Mobility charge 1 Mobility   Pt agreeable to mobility session. Required no physical assistance throughout ambulation, however x4 short seated rest breaks taken d/t fatigue. 6LO2 needed to maintain SpO2 WFL. Pt left sitting EOB, back on 4LO2.   Addison Lank Mobility Specialist Please contact via SecureChat or  Rehab office at [phone]

## 2022-05-03 NOTE — Progress Notes (Signed)
Physical Therapy Treatment Patient Details Name: Kirk [name] MRN: [id number] DOB: 07-16-1960 Today's Date: 05/03/2022   History of Present Illness Pt is 62 year old presented to [name] on  04/28/22 for SOB, cough, and headache. BP 224/93. SpO2 89%. Adm with hypertensive crisis and acute respiratory failure. PMH - ESRD on HD, failed kidney transplant, htn, anxiety, OSA, SDH, seizure    PT Comments    Pt with fair tolerance to treatment today. Pt was able to ambulate in hallway with rollator and navigate a flight of stairs at supervision/Min guard level. Pt required more seated rest breaks today compared to yesterday and had 2 losses of balance noted when standing requiring Min G/Min A to correct. Pt received on 4L at 88%. Pt titrated to 6L for gait satting at 95%. Pt left on 4L at 92%. Pt appeared more anxious today about breathing despite SpO2 being above 95% for duration of session. No change in DC/DME recs at this time. Pt continues to demonstrate poor safety awareness with rollator and requires additional reinforcement.    Recommendations for follow up therapy are one component of a multi-disciplinary discharge planning process, led by the attending physician.  Recommendations may be updated based on patient status, additional functional criteria and insurance authorization.  Follow Up Recommendations       Assistance Recommended at Discharge PRN  Patient can return home with the following Help with stairs or ramp for entrance   Equipment Recommendations  Rollator (4 wheels)    Recommendations for Other Services       Precautions / Restrictions Precautions Precautions: Fall     Mobility  Bed Mobility               General bed mobility comments: Pt received seated EOB    Transfers Overall transfer level: Needs assistance Equipment used: Rollator (4 wheels) Transfers: Sit to/from Stand Sit to Stand: Supervision, Min guard           General transfer comment: Cues  for locking brakes. Multiple sit to stands performed with patient required min guard once fatigued for balance.    Ambulation/Gait Ambulation/Gait assistance: Min guard Gait Distance (Feet): 600 Feet Assistive device: Rollator (4 wheels) Gait Pattern/deviations: Step-through pattern, Decreased stride length, Drifts right/left, Trunk flexed Gait velocity: decr     General Gait Details: 5 seated rest breaks. Multiple cues for locking rollator brakes.   Stairs Stairs: Yes Stairs assistance: Supervision Stair Management: One rail Left, Alternating pattern, Forwards Number of Stairs: 10 General stair comments: Overall steady however reported SOB.   Wheelchair Mobility    Modified Rankin (Stroke Patients Only)       Balance Overall balance assessment: Needs assistance Sitting-balance support: No upper extremity supported, Feet supported Sitting balance-Leahy Scale: Good     Standing balance support: No upper extremity supported, During functional activity Standing balance-Leahy Scale: Fair Standing balance comment: 2 LOB noted with sit to stands requiring Min G/Min A to correct                            Cognition Arousal/Alertness: Awake/alert Behavior During Therapy: Anxious, Restless Overall Cognitive Status: Within Functional Limits for tasks assessed                                          Exercises      General Comments  General comments (skin integrity, edema, etc.): Pt recieved on 4L at 88%. Pt titrated to 6L for gait satting at 95%. Pt left on 4L at 92%.      Pertinent Vitals/Pain Pain Assessment Pain Assessment: No/denies pain    Home Living                          Prior Function            PT Goals (current goals can now be found in the care plan section) Progress towards PT goals: Progressing toward goals    Frequency    Min 1X/week      PT Plan Current plan remains appropriate     Co-evaluation              AM-PAC PT "6 Clicks" Mobility   Outcome Measure  Help needed turning from your back to your side while in a flat bed without using bedrails?: None Help needed moving from lying on your back to sitting on the side of a flat bed without using bedrails?: None Help needed moving to and from a bed to a chair (including a wheelchair)?: A Little Help needed standing up from a chair using your arms (e.g., wheelchair or bedside chair)?: A Little Help needed to walk in [name] room?: A Little Help needed climbing 3-5 steps with a railing? : A Little 6 Click Score: 20    End of Session Equipment Utilized During Treatment: Gait belt;Oxygen Activity Tolerance: Patient tolerated treatment well;Patient limited by fatigue Patient left: in bed;with call bell/phone within reach Nurse Communication: Mobility status;Other (comment) (O2 sats) PT Visit Diagnosis: Unsteadiness on feet (R26.81);Other abnormalities of gait and mobility (R26.89);Muscle weakness (generalized) (M62.81);History of falling (Z91.81)     Time: 4970-2637 PT Time Calculation (min) (ACUTE ONLY): 20 min  Charges:  $Gait Training: 8-22 mins                     Shela Nevin, PT, DPT Acute Rehab Services [phone]    [name] 05/03/2022, 4:01 PM

## 2022-05-03 NOTE — Progress Notes (Signed)
Pt states he will self administer CPAP when he is ready for bed. 

## 2022-05-03 NOTE — Progress Notes (Signed)
Kirk [name]:[id number] DOB: 05/04/1960 DOA: 04/28/2022 PCP: Caesar Bookman, NP   Subj: Kirk [name] is a 62 y.o. 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  CT is negative for acute intracranial abnormality. Labs are notable for normal lactic acid, normal WBC, platelets 60,000, normal potassium, normal serum bicarbonate, and normal BUN. COVID, influenza, and RSV PCR are negative.  Respiratory panel shows rhinovirus positive.    Obj: 4/11 afebrile overnight A/O x 4, continued positive chest tightness/discomfort, positive SOB.  Negative nausea, negative vomiting.   Objective: VITAL SIGNS: Temp: 98.8 F (37.1 C) (04/11 1420) Temp Source: Oral (04/11 1420) BP: 145/55 (04/11 1420) Pulse Rate: 70 (04/11 1420)   VENTILATOR SETTINGS: Nasal cannula 4/11 Flow 4 L/min SpO2 93%   Intake/Output Summary (Last 24 hours) at 05/03/2022 1546 Last data filed at 05/02/2022 1850 Gross per 24 hour  Intake 350 ml  Output --  Net 350 ml     Physical Exam:  General: A/O x 4, positive acute respiratory distress Eyes: negative scleral hemorrhage, negative anisocoria, negative icterus ENT: Negative Runny nose, negative gingival bleeding, Neck:  Negative scars, masses, torticollis, lymphadenopathy, JVD Lungs: decreased breath sounds RIGHT >>> Left, positive inspiratory and expiratory wheezing.  Positive rhonchi.  Negative crackles Cardiovascular: Regular rate and rhythm without murmur gallop or rub normal S1 and S2 Abdomen: negative abdominal pain, nondistended, positive soft, bowel sounds, no rebound, no ascites, no appreciable mass Extremities: No significant cyanosis, clubbing, or edema bilateral lower extremities Skin: Negative rashes, lesions, ulcers Psychiatric:  Negative depression, negative anxiety, negative  fatigue, negative mania  Central nervous system:  Cranial nerves II through XII intact, tongue/uvula midline, all extremities muscle strength 5/5, sensation intact throughout, negative dysarthria, negative expressive aphasia, negative receptive aphasia.      Mobility Assessment (last 72 hours)     Mobility Assessment     Row Name 05/02/22 1500 05/01/22 1100 05/01/22 0830       Does patient have an order for bedrest or is patient medically unstable -- -- No - Continue assessment     What is the highest level of mobility based on the progressive mobility assessment? Level 5 (Walks with assist in room/hall) - Balance while stepping forward/back and can walk in room with assist - Complete Level 5 (Walks with assist in room/hall) - Balance while stepping forward/back and can walk in room with assist - Complete Level 5 (Walks with assist in room/hall) - Balance while stepping forward/back and can walk in room with assist - Complete                DVT prophylaxis:  Code Status: Full Family Communication:  Status is: Inpatient    Dispo: The patient is from: Home              Anticipated d/c is to: Home              Anticipated d/c date is: 3 days              Patient currently is not medically stable to d/c.    Procedures/Significant Events: 4/9 CT chest W. Wo contrast -ucous plugging, mild consolidations and numerous small pulmonary nodules most pronounced in the lower lungs, slightly worsened when compared with the prior exam. Findings are likely due to infection or aspiration. 2. Severe coronary artery  calcifications.  Consultants:     Cultures 4/6 influenza A/Bnegative 4/6 RSV negative 4/6 SARS coronavirus negative 4/7 respiratory virus panel positive rhinovirus  Antimicrobials:    Assessment & Plan: Covid vaccination;   Principal Problem:   Hypertensive crisis Active Problems:   Seizure   Thrombocytopenia   ESRD on dialysis   Acute respiratory failure  with hypoxia   Secondary central sleep apnea  Acute respiratory failure with hypoxia -Continuous pulse ox - Titrate O2 to maintain SpO2> 93% -Obtain ambulatory SpO2 in a.m. ADDENDUM 4/11 hold given patient's continued high O2 demand.  Rhinovirus pneumonia -DuoNeb QID -Solu-Medrol 60 mg daily - Incentive spirometry - Flutter valve - Mucinex DM BID -4/11 hypertonic saline nebulizer BID.  Following each nebulizer treatment treat with DuoNeb  OSA - CPAP per respiratory therapy  HTN crisis -Amlodipine 10 mg daily -Metoprolol 100 mg BID -Resolved most likely secondary to requiring HD, and rhinovirus pneumonia  ESRD on HD M/W/F -HD per nephrology  Chronic thrombocytopenia -Monitor closely, currently no sign of acute bleed -4/11 slight trend upward  Anemia of chronic disease -Anemia panel pending  Seizures/headache -Keppra 250 mg M/W/F with HD  HLD -Pravastatin 40 mg daily - Lipid panel pending  Obesity (BMI 30.7 kg/m) -Follow-up with PCP    Mobility Assessment (last 72 hours)     Mobility Assessment     Row Name 05/02/22 1500 05/01/22 1100 05/01/22 0830       Does patient have an order for bedrest or is patient medically unstable -- -- No - Continue assessment     What is the highest level of mobility based on the progressive mobility assessment? Level 5 (Walks with assist in room/hall) - Balance while stepping forward/back and can walk in room with assist - Complete Level 5 (Walks with assist in room/hall) - Balance while stepping forward/back and can walk in room with assist - Complete Level 5 (Walks with assist in room/hall) - Balance while stepping forward/back and can walk in room with assist - Complete                   Time: 50 minutes         Care during the described time interval was provided by me .  I have reviewed this patient's available data, including medical history, events of note, physical examination, and all test results as part  of my evaluation.

## 2022-05-03 NOTE — Plan of Care (Signed)

## 2022-05-03 NOTE — Progress Notes (Addendum)
PT Cancellation Note  Patient Details Name: BRACY MCCLELLAND MRN: [id number] DOB: [birth year]   Cancelled Treatment:    Reason Eval/Treat Not Completed: Other (comment) (Pt just placed on CPAP by RT and states that he "cant breathe". Will follow up later if time allows.)   [name] 05/03/2022, 8:22 AM

## 2022-05-03 NOTE — Consult Note (Signed)
   [name] CM Inpatient Consult   05/03/2022  ALVON HEINBAUGH February 09, 1960 [id number]   Follow up:  Ongoing post hospital needs  Spoke with wife,via phone, Chyrl Civatte, HIPAA veriifed regarding any new post hospital needs.  She endorses ongoing follow up with established [facility] RN CC.  Spoke about rollator needs and how she maybe can have a therapist to evaluate the one they have from her mother.  No new needs at this time.  Will plan on follow up with [facility] RN CC post hospital was to be rescheduled.  For questions,  Charlesetta Shanks, RN BSN CCM [facility]  [phone] business mobile phone Toll free office [phone]  *Concierge Line  [phone] Fax number: [phone] [email or web address]

## 2022-05-03 NOTE — Progress Notes (Signed)
[facility] Progress Note   Subjective:  Seen in room - Cpap in place, still with a lot of dyspnea. Underwent chest CT yesterday showing mucus plugging in lower lobes. He does not think that he has extra fluid on, CT did not show that. However, offered extra HD - he thinks he will be ok without.  Objective Vitals:   05/03/22 0229 05/03/22 0448 05/03/22 0629 05/03/22 0950  BP: (!) 165/68 (!) 185/69  (!) 175/69  Pulse: 63     Resp: 18     Temp: (!) 97.5 F (36.4 C)     TempSrc: Oral     SpO2: 93%     Weight:   85.3 kg   Height:       Physical Exam General: Well appearing, but tired man. C-pap in place Heart: RRR; no murmur Lungs: Rhonchi throughout Abdomen: soft Extremities: no LE edema Dialysis Access:  LUE AVF + bruit  Additional Objective Labs: Basic Metabolic Panel: Recent Labs  Lab 04/29/22 0602 04/30/22 0203 05/01/22 0113 05/02/22 0210 05/03/22 0330  NA 141   < > 136 133* 134*  K 3.9   < > 4.2 4.2 4.7  CL 95*   < > 97* 98 97*  CO2 29   < > 30 26 26   GLUCOSE 104*   < > 118* 102* 145*  BUN 16   < > 23* 40* 28*  CREATININE 8.76*   < > 8.00* 10.28* 7.61*  CALCIUM 8.7*   < > 8.9 9.1 9.3  PHOS 4.7*  --   --   --  2.9   < > = values in this interval not displayed.   Liver Function Tests: Recent Labs  Lab 04/28/22 1955 05/03/22 0330  AST 17 13*  ALT 13 13  ALKPHOS 57 49  BILITOT 0.9 0.7  PROT 6.7 6.2*  ALBUMIN 3.8 3.3*   CBC: Recent Labs  Lab 04/28/22 1955 04/29/22 0602 04/30/22 0203 05/01/22 0113 05/02/22 0210 05/03/22 0330  WBC 5.4 6.6 4.2 3.5* 4.0 3.5*  NEUTROABS 4.2  --   --   --  3.0 3.1  HGB 11.2* 10.5* 10.8* 10.5* 10.2* 10.4*  HCT 35.3* 32.9* 32.9* 32.2* 31.4* 31.5*  MCV 97.8 97.6 94.3 95.3 94.9 95.5  PLT 60* 54* 55* 58* 59* 69*   Studies/Results: CT CHEST WO CONTRAST  Result Date: 05/01/2022 CLINICAL DATA:  Respiratory illness EXAM: CT CHEST WITHOUT CONTRAST TECHNIQUE: Multidetector CT imaging of the chest was performed  following the standard protocol without IV contrast. RADIATION DOSE REDUCTION: This exam was performed according to the departmental dose-optimization program which includes automated exposure control, adjustment of the mA and/or kV according to patient size and/or use of iterative reconstruction technique. COMPARISON:  Chest CT dated August 01, 2021 FINDINGS: Cardiovascular: Cardiomegaly. Trace pericardial effusions. Normal caliber thoracic aorta with moderate atherosclerotic disease. Severe coronary artery calcifications. Mediastinum/Nodes: Esophagus and thyroid unremarkable. No pathologically enlarged nodes seen in the chest. Lungs/Pleura: Central airways are patent. Mucous plugging, mild consolidations and numerous small pulmonary nodules most pronounced in the lower lungs, slightly worsened when compared with the prior exam. Associated lower lobe dendriform pulmonary ossification, similar to prior. No pleural effusion or pneumothorax. Upper Abdomen: Cholecystectomy clips. 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 No aggressive appearing osseous lesions. IMPRESSION: 1. Mucous plugging, mild consolidations and numerous small pulmonary nodules most pronounced in the lower lungs, slightly worsened when compared with the prior exam. Findings are likely due to infection or aspiration.  2. Severe coronary artery calcifications. 3. Aortic Atherosclerosis (ICD10-I70.0). Electronically Signed   By: Allegra Lai M.D.   On: 05/01/2022 13:02    Medications:  sodium chloride     azithromycin 500 mg (05/02/22 1712)   cefTRIAXone (ROCEPHIN)  IV 1 g (05/02/22 1845)    amLODipine  10 mg Oral QPM   dextromethorphan-guaiFENesin  1 tablet Oral BID   doxazosin  8 mg Oral Daily   DULoxetine  30 mg Oral Daily   heparin injection (subcutaneous)  5,000 Units Subcutaneous Q8H   hydrALAZINE  100 mg Oral Q8H   ipratropium-albuterol  3 mL Nebulization Q6H   irbesartan  150 mg Oral Daily    isosorbide mononitrate  90 mg Oral Daily   lanthanum  1,000 mg Oral TID WC   levETIRAcetam  250 mg Oral Q M,W,F-HD   methylPREDNISolone (SOLU-MEDROL) injection  60 mg Intravenous Daily   metoprolol tartrate  100 mg Oral BID   pravastatin  40 mg Oral q1800   sodium chloride flush  3 mL Intravenous Q12H   sodium chloride flush  3 mL Intravenous Q12H   traZODone  50 mg Oral QHS    Dialysis Orders: MWF [facility] 4:15hr, EDW 86.4kg, 2K/2Ca bath, LUE AVF, Heparin 2500 unit bolus - venofer 50 mg weekly - mircera 200 mcg IV q 2 wks, last 4/01, due 4/15 - rocaltrol 1.25 mcg po tiw   Assessment/ Plan: Acute hypoxic resp failure/rhinovirus: Still requiring O2, chest CT 4/9 showing mucus plugging - getting prednisone, bronchodilators, cough meds. Very slow improvement. ESRD: Continue HD on MWF schedule -> HD tomorrow. HTN urgency: Improving, appears euvolemic on exam but with HTN - lower EDW as tolerated. Anemia of ESRD: Hgb 10.4, not due for ESA yet. Secondary HTPH: Ca/Phos ok - continue lanthanum binder.  Hx seizure disorder: Continue home meds.    Ozzie Hoyle, PA-C 05/03/2022, 11:04 AM  [employer]

## 2022-05-03 NOTE — Progress Notes (Signed)
   05/03/22 1305  BiPAP/CPAP/SIPAP  BiPAP/CPAP/SIPAP Pt Type Adult (on stand by)  BiPAP/CPAP/SIPAP DREAMSTATIOND  BiPAP/CPAP /SiPAP Vitals  Pulse Rate 71  SpO2 96 %  MEWS Score/Color  MEWS Score 0  MEWS Score Color [place] Si

## 2022-05-04 DIAGNOSIS — I169 Hypertensive crisis, unspecified: Secondary | ICD-10-CM | POA: Diagnosis not present

## 2022-05-04 DIAGNOSIS — R569 Unspecified convulsions: Secondary | ICD-10-CM | POA: Diagnosis not present

## 2022-05-04 DIAGNOSIS — D696 Thrombocytopenia, unspecified: Secondary | ICD-10-CM | POA: Diagnosis not present

## 2022-05-04 DIAGNOSIS — N186 End stage renal disease: Secondary | ICD-10-CM | POA: Diagnosis not present

## 2022-05-04 LAB — COMPREHENSIVE METABOLIC PANEL
ALT: 11 U/L (ref 0–44)
AST: 11 U/L — ABNORMAL LOW (ref 15–41)
Albumin: 3 g/dL — ABNORMAL LOW (ref 3.5–5.0)
Alkaline Phosphatase: 46 U/L (ref 38–126)
Anion gap: 12 (ref 5–15)
BUN: 53 mg/dL — ABNORMAL HIGH (ref 6–20)
CO2: 24 mmol/L (ref 22–32)
Calcium: 9.1 mg/dL (ref 8.9–10.3)
Chloride: 96 mmol/L — ABNORMAL LOW (ref 98–111)
Creatinine, Ser: 9.76 mg/dL — ABNORMAL HIGH (ref 0.61–1.24)
GFR, Estimated: 6 mL/min — ABNORMAL LOW (ref >60)
Glucose, Bld: 123 mg/dL — ABNORMAL HIGH (ref 70–99)
Potassium: 4.1 mmol/L (ref 3.5–5.1)
Sodium: 132 mmol/L — ABNORMAL LOW (ref 135–145)
Total Bilirubin: 0.9 mg/dL (ref 0.3–1.2)
Total Protein: 5.6 g/dL — ABNORMAL LOW (ref 6.5–8.1)

## 2022-05-04 LAB — CBC WITH DIFFERENTIAL/PLATELET
Abs Immature Granulocytes: 0.03 10*3/uL (ref 0.00–0.07)
Basophils Absolute: 0 10*3/uL (ref 0.0–0.1)
Basophils Relative: 0 %
Eosinophils Absolute: 0 10*3/uL (ref 0.0–0.5)
Eosinophils Relative: 0 %
HCT: 32.7 % — ABNORMAL LOW (ref 39.0–52.0)
Hemoglobin: 10.9 g/dL — ABNORMAL LOW (ref 13.0–17.0)
Immature Granulocytes: 1 %
Lymphocytes Relative: 18 %
Lymphs Abs: 0.9 10*3/uL (ref 0.7–4.0)
MCH: 31.4 pg (ref 26.0–34.0)
MCHC: 33.3 g/dL (ref 30.0–36.0)
MCV: 94.2 fL (ref 80.0–100.0)
Monocytes Absolute: 0.5 10*3/uL (ref 0.1–1.0)
Monocytes Relative: 10 %
Neutro Abs: 3.6 10*3/uL (ref 1.7–7.7)
Neutrophils Relative %: 71 %
Platelets: 67 10*3/uL — ABNORMAL LOW (ref 150–400)
RBC: 3.47 MIL/uL — ABNORMAL LOW (ref 4.22–5.81)
RDW: 16.8 % — ABNORMAL HIGH (ref 11.5–15.5)
WBC: 5.1 10*3/uL (ref 4.0–10.5)
nRBC: 0 % (ref 0.0–0.2)

## 2022-05-04 LAB — MAGNESIUM: Magnesium: 2.1 mg/dL (ref 1.7–2.4)

## 2022-05-04 LAB — PHOSPHORUS: Phosphorus: 4.4 mg/dL (ref 2.5–4.6)

## 2022-05-04 MED ORDER — HEPARIN SODIUM (PORCINE) 1000 UNIT/ML DIALYSIS
2000.0000 [IU] | Freq: Once | INTRAMUSCULAR | Status: AC
  Administered 2022-05-04: 2000 [IU] via INTRAVENOUS_CENTRAL
  Filled 2022-05-04: qty 2

## 2022-05-04 NOTE — Procedures (Signed)
HD Note:  Some information was entered later than the data was gathered due to patient care needs. The stated time with the data is accurate. Received patient in bed to unit.  Alert and oriented.  Informed consent signed and in chart.   TX duration: 2.75 hours  Patient had significant coughing.  Medication given, see MAR.  CPAP was applied per patient request. Patient coughing continued and he decided he wanted to end treatment due to the pain of the coughing.  Transported back to the room   Hand-off given to patient's nurse.   Access used: Left forearm Access issues: Only had issues with coughing interrupting the machine. Did add time to the process, not to the treatment time.  Total UF removed: 2400 ml    [facility]

## 2022-05-04 NOTE — Progress Notes (Signed)
Physical Therapy Treatment Patient Details Name: Kirk [name] MRN: [id number] DOB: [birth year] Today's Date: 05/04/2022   History of Present Illness Pt is 62 year old presented to [facility] on  04/28/22 for SOB, cough, and headache. BP 224/93. SpO2 89%. Adm with hypertensive crisis and acute respiratory failure. PMH - ESRD on HD, failed kidney transplant, htn, anxiety, OSA, SDH, seizure    PT Comments    Patient resting in bed on arrival and agreeable to therapy session. Pt required min guard/supervision for sit<>stand from EOB and rollator with cues for locking brakes of rollator before standing. Pt ambulated ~400' with 1 seated rest break halfway through. Pt required cues to push rollator against wall for safety with seated rest breaks. Additional practice rest break taken in room at end of ambulation. Pt on 4L/min throughout and SpO2 >91% with gait and 99% at rest. Repeated sit<>stands completed at EOS from EOB with no UE use for LE strengthening. Pt will continue to benefit from therapy to address deficits, will progress as able.   Recommendations for follow up therapy are one component of a multi-disciplinary discharge planning process, led by the attending physician.  Recommendations may be updated based on patient status, additional functional criteria and insurance authorization.  Follow Up Recommendations       Assistance Recommended at Discharge PRN  Patient can return home with the following Help with stairs or ramp for entrance   Equipment Recommendations  Rollator (4 wheels)    Recommendations for Other Services       Precautions / Restrictions Precautions Precautions: Fall Restrictions Weight Bearing Restrictions: No     Mobility  Bed Mobility               General bed mobility comments: Pt received seated EOB    Transfers Overall transfer level: Needs assistance Equipment used: Rollator (4 wheels) Transfers: Sit to/from Stand Sit to Stand: Supervision, Min  guard           General transfer comment: Cues for locking brakes. Multiple sit to stands performed with patient required min guard for balance/safety.    Ambulation/Gait Ambulation/Gait assistance: Min guard Gait Distance (Feet): 400 Feet Assistive device: Rollator (4 wheels) Gait Pattern/deviations: Step-through pattern, Decreased stride length, Drifts right/left, Trunk flexed Gait velocity: decr     General Gait Details: min guard with cues to avoid obstacles on Lt as bumped rollator into cart and door on Lt during first bout of gait. SpO2 >91% on 4L/min with ambulation. Pt completed 1 seated rest break in hallway, cues needed to push rollator against wall for increased safety vs locking it in the middle of the hallway. pt practiced second rest break in room against wall. then ambulated ~6' from rollator to EOB.   Stairs             Wheelchair Mobility    Modified Rankin (Stroke Patients Only)       Balance Overall balance assessment: Needs assistance Sitting-balance support: No upper extremity supported, Feet supported Sitting balance-Leahy Scale: Good     Standing balance support: No upper extremity supported, During functional activity Standing balance-Leahy Scale: Fair                              Cognition Arousal/Alertness: Awake/alert Behavior During Therapy: Anxious, Restless Overall Cognitive Status: Within Functional Limits for tasks assessed  Exercises      General Comments        Pertinent Vitals/Pain Pain Assessment Pain Assessment: No/denies pain    Home Living                          Prior Function            PT Goals (current goals can now be found in the care plan section) Acute Rehab PT Goals PT Goal Formulation: With patient Time For Goal Achievement: 05/15/22 Potential to Achieve Goals: Good Progress towards PT goals: Progressing toward  goals    Frequency    Min 1X/week      PT Plan Current plan remains appropriate    Co-evaluation              AM-PAC PT "6 Clicks" Mobility   Outcome Measure  Help needed turning from your back to your side while in a flat bed without using bedrails?: None Help needed moving from lying on your back to sitting on the side of a flat bed without using bedrails?: None Help needed moving to and from a bed to a chair (including a wheelchair)?: A Little Help needed standing up from a chair using your arms (e.g., wheelchair or bedside chair)?: A Little Help needed to walk in hospital room?: A Little Help needed climbing 3-5 steps with a railing? : A Little 6 Click Score: 20    End of Session Equipment Utilized During Treatment: Gait belt;Oxygen Activity Tolerance: Patient tolerated treatment well;Patient limited by fatigue Patient left: in bed;with call bell/phone within reach Nurse Communication: Mobility status;Other (comment) (O2 sats) PT Visit Diagnosis: Unsteadiness on feet (R26.81);Other abnormalities of gait and mobility (R26.89);Muscle weakness (generalized) (M62.81);History of falling (Z91.81)     Time: 2197-5883 PT Time Calculation (min) (ACUTE ONLY): 15 min  Charges:  $Gait Training: 8-22 mins                        Wynn Maudlin, DPT Acute Rehabilitation Services Office [phone]  05/04/22 3:25 PM

## 2022-05-04 NOTE — Procedures (Signed)
I was present at this dialysis session. I have reviewed the session itself and made appropriate changes.   3K bath AM K of 4.1, UF goal 3L using LFA AVF.  Remains on CPAP (has OSA uses at home) at night 4L.    Hb stable in 10s.    Next HD tentative 4/15  [facility] Weights   05/03/22 0629 05/04/22 0426 05/04/22 0830  Weight: 85.3 kg 87.7 kg 88.6 kg    Recent Labs  Lab 05/04/22 0428  NA 132*  K 4.1  CL 96*  CO2 24  GLUCOSE 123*  BUN 53*  CREATININE 9.76*  CALCIUM 9.1  PHOS 4.4    Recent Labs  Lab 05/02/22 0210 05/03/22 0330 05/04/22 0428  WBC 4.0 3.5* 5.1  NEUTROABS 3.0 3.1 3.6  HGB 10.2* 10.4* 10.9*  HCT 31.4* 31.5* 32.7*  MCV 94.9 95.5 94.2  PLT 59* 69* 67*    Scheduled Meds:  amLODipine  10 mg Oral QPM   dextromethorphan-guaiFENesin  1 tablet Oral BID   doxazosin  8 mg Oral Daily   DULoxetine  30 mg Oral Daily   heparin  2,000 Units Dialysis Once in dialysis   heparin injection (subcutaneous)  5,000 Units Subcutaneous Q8H   hydrALAZINE  100 mg Oral Q8H   ipratropium-albuterol  3 mL Nebulization Q6H   irbesartan  150 mg Oral Daily   isosorbide mononitrate  90 mg Oral Daily   lanthanum  1,000 mg Oral TID WC   levETIRAcetam  250 mg Oral Q M,W,F-HD   methylPREDNISolone (SOLU-MEDROL) injection  60 mg Intravenous Daily   metoprolol tartrate  100 mg Oral BID   pravastatin  40 mg Oral q1800   sodium chloride flush  3 mL Intravenous Q12H   sodium chloride flush  3 mL Intravenous Q12H   sodium chloride HYPERTONIC  4 mL Nebulization BID   traZODone  50 mg Oral QHS   Continuous Infusions:  sodium chloride     PRN Meds:.sodium chloride, acetaminophen, albuterol, benzonatate, fentaNYL (SUBLIMAZE) injection, hydrALAZINE, ondansetron **OR** ondansetron (ZOFRAN) IV, sodium chloride flush   Kirk Heck  MD 05/04/2022, 9:27 AM

## 2022-05-04 NOTE — Progress Notes (Signed)
Kirk [name] QKM:[id number] DOB: Dec 30, 1960 DOA: 04/28/2022 PCP: Caesar Bookman, NP   Subj: Kirk [name] is a 62 y.o. 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  CT is negative for acute intracranial abnormality. Labs are notable for normal lactic acid, normal WBC, platelets 60,000, normal potassium, normal serum bicarbonate, and normal BUN. COVID, influenza, and RSV PCR are negative.  Respiratory panel shows rhinovirus positive.    Obj: 4/12 afebrile overnight, A/O x 4, tired after HD.  Positive SOB.  States overnight post hypertonic saline neb was able to cough up significant amount of sputum.   Objective: VITAL SIGNS: Temp: 98.1 F (36.7 C) (04/12 0426) Temp Source: Oral (04/12 0426) BP: 161/67 (04/12 0426) Pulse Rate: 78 (04/12 0426)   VENTILATOR SETTINGS: Nasal cannula4/12 Flow 4 L/min SpO2 96%  No intake or output data in the 24 hours ending 05/04/22 0814  Physical Exam:  General: A/O x 4, positive acute respiratory distress Eyes: negative scleral hemorrhage, negative anisocoria, negative icterus ENT: Negative Runny nose, negative gingival bleeding, Neck:  Negative scars, masses, torticollis, lymphadenopathy, JVD Lungs: decreased breath sounds but improved from 9/11, positive expiratory wheeze but improved, negative crackles  Cardiovascular: Regular rate and rhythm without murmur gallop or rub normal S1 and S2 Abdomen: negative abdominal pain, nondistended, positive soft, bowel sounds, no rebound, no ascites, no appreciable mass Extremities: No significant cyanosis, clubbing, or edema bilateral lower extremities Skin: Negative rashes, lesions, ulcers Psychiatric:  Negative depression, negative anxiety, negative fatigue, negative mania  Central nervous system:  Cranial nerves II through XII  intact, tongue/uvula midline, all extremities muscle strength 5/5, sensation intact throughout, negative dysarthria, negative expressive aphasia, negative receptive aphasia.     Mobility Assessment (last 72 hours)     Mobility Assessment     Row Name 05/03/22 1500 05/02/22 1500 05/01/22 1100 05/01/22 0830     Does patient have an order for bedrest or is patient medically unstable -- -- -- No - Continue assessment    What is the highest level of mobility based on the progressive mobility assessment? Level 5 (Walks with assist in room/hall) - Balance while stepping forward/back and can walk in room with assist - Complete Level 5 (Walks with assist in room/hall) - Balance while stepping forward/back and can walk in room with assist - Complete Level 5 (Walks with assist in room/hall) - Balance while stepping forward/back and can walk in room with assist - Complete Level 5 (Walks with assist in room/hall) - Balance while stepping forward/back and can walk in room with assist - Complete               DVT prophylaxis:  Code Status: Full Family Communication: 4/12 spoke with [name] wife  Status is: Inpatient    Dispo: The patient is from: Home              Anticipated d/c is to: Home              Anticipated d/c date is: 3 days              Patient currently is not medically stable to d/c.    Procedures/Significant Events: 4/9 CT chest W. Wo contrast -ucous plugging, mild consolidations and numerous small pulmonary nodules most pronounced in the lower lungs, slightly worsened when compared with the prior exam. Findings  are likely due to infection or aspiration. 2. Severe coronary artery calcifications.  Consultants:     Cultures 4/6 influenza A/B negative 4/6 RSV negative 4/6 SARS coronavirus negative 4/7 respiratory virus panel positive rhinovirus  Antimicrobials:    Assessment & Plan: Covid vaccination;   Principal Problem:   Hypertensive crisis Active  Problems:   Seizure   Thrombocytopenia   ESRD on dialysis   Acute respiratory failure with hypoxia   Secondary central sleep apnea  Acute respiratory failure with hypoxia -Continuous pulse ox - Titrate O2 to maintain SpO2> 93% -Obtain ambulatory SpO2 in a.m. ADDENDUM 4/11 hold given patient's continued high O2 demand. -4/12 see pneumonia  Positive Rhinovirus pneumonia -DuoNeb QID -Solu-Medrol 60 mg daily - Incentive spirometry - Flutter valve - Mucinex DM BID -4/11 hypertonic saline nebulizer BID.  Following each nebulizer treatment treat with DuoNeb -4/12 continue supportive care  OSA - CPAP per respiratory therapy  HTN crisis -Amlodipine 10 mg daily -Metoprolol 100 mg BID -Resolved most likely secondary to requiring HD, and rhinovirus pneumonia  ESRD on HD M/W/F -HD per nephrology -4/12 last HD session today  Chronic thrombocytopenia -Monitor closely, currently no sign of acute bleed -4/11 slight trend upward  Anemia of chronic disease -Anemia panel c/w anemia of chronic DZ -Transfuse for hemoglobin<7  Seizures/headache -Keppra 250 mg M/W/F with HD  HLD -Pravastatin 40 mg daily - Lipid panel pending  Obesity (BMI 30.7 kg/m) -Follow-up with PCP    Mobility Assessment (last 72 hours)     Mobility Assessment     Row Name 05/03/22 1500 05/02/22 1500 05/01/22 1100 05/01/22 0830     Does patient have an order for bedrest or is patient medically unstable -- -- -- No - Continue assessment    What is the highest level of mobility based on the progressive mobility assessment? Level 5 (Walks with assist in room/hall) - Balance while stepping forward/back and can walk in room with assist - Complete Level 5 (Walks with assist in room/hall) - Balance while stepping forward/back and can walk in room with assist - Complete Level 5 (Walks with assist in room/hall) - Balance while stepping forward/back and can walk in room with assist - Complete Level 5 (Walks with  assist in room/hall) - Balance while stepping forward/back and can walk in room with assist - Complete                  Time: 50 minutes         Care during the described time interval was provided by me .  I have reviewed this patient's available data, including medical history, events of note, physical examination, and all test results as part of my evaluation.

## 2022-05-04 NOTE — Care Management Important Message (Signed)
Important Message  Patient Details  Name: Kirk [name] MRN: [id number] Date of Birth: [birth year]   Medicare Important Message Given:  Yes     Renie Ora 05/04/2022, 3:00 PM

## 2022-05-05 ENCOUNTER — Inpatient Hospital Stay (HOSPITAL_COMMUNITY): Payer: Medicare Other

## 2022-05-05 DIAGNOSIS — I169 Hypertensive crisis, unspecified: Secondary | ICD-10-CM | POA: Diagnosis not present

## 2022-05-05 LAB — COMPREHENSIVE METABOLIC PANEL
ALT: 15 U/L (ref 0–44)
AST: 16 U/L (ref 15–41)
Albumin: 3.3 g/dL — ABNORMAL LOW (ref 3.5–5.0)
Alkaline Phosphatase: 50 U/L (ref 38–126)
Anion gap: 10 (ref 5–15)
BUN: 41 mg/dL — ABNORMAL HIGH (ref 6–20)
CO2: 27 mmol/L (ref 22–32)
Calcium: 9.1 mg/dL (ref 8.9–10.3)
Chloride: 96 mmol/L — ABNORMAL LOW (ref 98–111)
Creatinine, Ser: 7.77 mg/dL — ABNORMAL HIGH (ref 0.61–1.24)
GFR, Estimated: 7 mL/min — ABNORMAL LOW (ref >60)
Glucose, Bld: 110 mg/dL — ABNORMAL HIGH (ref 70–99)
Potassium: 4.9 mmol/L (ref 3.5–5.1)
Sodium: 133 mmol/L — ABNORMAL LOW (ref 135–145)
Total Bilirubin: 1.1 mg/dL (ref 0.3–1.2)
Total Protein: 6 g/dL — ABNORMAL LOW (ref 6.5–8.1)

## 2022-05-05 LAB — CBC WITH DIFFERENTIAL/PLATELET
Abs Immature Granulocytes: 0.01 10*3/uL (ref 0.00–0.07)
Basophils Absolute: 0 10*3/uL (ref 0.0–0.1)
Basophils Relative: 0 %
Eosinophils Absolute: 0 10*3/uL (ref 0.0–0.5)
Eosinophils Relative: 0 %
HCT: 33.8 % — ABNORMAL LOW (ref 39.0–52.0)
Hemoglobin: 11.3 g/dL — ABNORMAL LOW (ref 13.0–17.0)
Immature Granulocytes: 0 %
Lymphocytes Relative: 10 %
Lymphs Abs: 0.5 10*3/uL — ABNORMAL LOW (ref 0.7–4.0)
MCH: 31.4 pg (ref 26.0–34.0)
MCHC: 33.4 g/dL (ref 30.0–36.0)
MCV: 93.9 fL (ref 80.0–100.0)
Monocytes Absolute: 0.4 10*3/uL (ref 0.1–1.0)
Monocytes Relative: 9 %
Neutro Abs: 3.9 10*3/uL (ref 1.7–7.7)
Neutrophils Relative %: 81 %
Platelets: 72 10*3/uL — ABNORMAL LOW (ref 150–400)
RBC: 3.6 MIL/uL — ABNORMAL LOW (ref 4.22–5.81)
RDW: 16.7 % — ABNORMAL HIGH (ref 11.5–15.5)
WBC: 4.8 10*3/uL (ref 4.0–10.5)
nRBC: 0 % (ref 0.0–0.2)

## 2022-05-05 LAB — LIPID PANEL
Cholesterol: 114 mg/dL (ref 0–200)
HDL: 41 mg/dL (ref >40)
LDL Cholesterol: 56 mg/dL (ref 0–99)
Total CHOL/HDL Ratio: 2.8 RATIO
Triglycerides: 86 mg/dL (ref <150)
VLDL: 17 mg/dL (ref 0–40)

## 2022-05-05 LAB — MAGNESIUM: Magnesium: 2 mg/dL (ref 1.7–2.4)

## 2022-05-05 LAB — PHOSPHORUS: Phosphorus: 3.5 mg/dL (ref 2.5–4.6)

## 2022-05-05 MED ORDER — AZITHROMYCIN 250 MG PO TABS
500.0000 mg | ORAL_TABLET | Freq: Every day | ORAL | Status: AC
  Administered 2022-05-05 – 2022-05-09 (×5): 500 mg via ORAL
  Filled 2022-05-05 (×5): qty 2

## 2022-05-05 MED ORDER — LORAZEPAM 0.5 MG PO TABS
0.5000 mg | ORAL_TABLET | Freq: Once | ORAL | Status: AC | PRN
  Administered 2022-05-06: 0.5 mg via ORAL
  Filled 2022-05-05: qty 1

## 2022-05-05 MED ORDER — IPRATROPIUM-ALBUTEROL 0.5-2.5 (3) MG/3ML IN SOLN
3.0000 mL | Freq: Two times a day (BID) | RESPIRATORY_TRACT | Status: DC
  Administered 2022-05-06 – 2022-05-09 (×7): 3 mL via RESPIRATORY_TRACT
  Filled 2022-05-05 (×8): qty 3

## 2022-05-05 MED ORDER — SODIUM CHLORIDE 0.9 % IV SOLN
2.0000 g | INTRAVENOUS | Status: DC
  Administered 2022-05-05 – 2022-05-08 (×4): 2 g via INTRAVENOUS
  Filled 2022-05-05 (×6): qty 20

## 2022-05-05 MED ORDER — IPRATROPIUM-ALBUTEROL 0.5-2.5 (3) MG/3ML IN SOLN: 3.0000 mL | Freq: Four times a day (QID) | RESPIRATORY_TRACT | Status: DC

## 2022-05-05 MED ORDER — PREDNISONE 20 MG PO TABS
40.0000 mg | ORAL_TABLET | Freq: Every day | ORAL | Status: DC
  Administered 2022-05-06 – 2022-05-09 (×4): 40 mg via ORAL
  Filled 2022-05-05 (×4): qty 2

## 2022-05-05 NOTE — Progress Notes (Signed)
PROGRESS NOTE  Kirk [name]  JYN:[id number] DOB: [birth year] DOA: 04/28/2022 PCP: Caesar Bookman, NP   Brief Narrative: Patient is a 62 year old male with history of ESRD on dialysis on MWF, chronic low cytopenia, hypertension, anxiety, OSA on CPAP, seizure disorder who presented to the emergency department complaining  shortness of breath, cough, and headache.   Respiratory viral panel positive for rhinovirus.  Hospital course  remarkable for persistent hypoxic respiratory failure requiring oxygen,trying to wean.  Nephrology following for dialysis  Assessment & Plan:  Principal Problem:   Hypertensive crisis Active Problems:   Seizure   Thrombocytopenia   ESRD on dialysis   Acute respiratory failure with hypoxia   Secondary central sleep apnea  Acute hypoxic respiratory failure/?  Atypical pneumonia: Rhinovirus was positive. CT chest on 4/9 showed mucous plugging, mild consolidations and numerous small pulmonary nodules most pronounced in the lower lungs. He is not on  oxygen at home.  Currently requiring 2 L of oxygen.  Will continue to try to wean.  On steroids, bronchodilators, flutter valve, incentive spirometer.  Continue Mucinex.  Added hypertonic saline nebulizer. Chest x-ray this morning showed nonspecific interstitial prominence consistent with atypical infection, edema or interstitial lung disease. No focal consolidation. Was previously on antibiotics,  discontinued.  Procalcitonin elevated 2.36 as per 4/7.  Patient having some cough. Will start on ceftriaxone ,azithromycin. Will get sputum culture. No history of chronic lung disease.  Previous smoker.  He might require home oxygen on discharge.   ESRD on dialysis: Nephrology following.  Next dialysis on Monday.  Hypertension: Currently on amlodipine, metoprolol.  Continue prn medications for severe hypertension  OSA: On CPAP  Chronic thrombocytopenia: No signs of acute bleed.  Continue to monitor.  Normocytic anemia:  Secondary to chronic kidney disease.  Continue to monitor.  History of seizures/headache: On Keppra on Monday, Wednesday, Friday with dialysis  Hyperlipidemia: On pravastatin  Obesity: BMI of 30         DVT prophylaxis:heparin injection 5,000 Units Start: 05/01/22 1400 SCDs Start: 04/29/22 0223     Code Status: Full Code  Family Communication: None at bedside  Patient status:Inpatient  Patient is from :Home  Anticipated discharge QM:VHQI  Estimated DC date:1-2 days   Consultants: Nephrology  Procedures: Dialysis  Antimicrobials:  Anti-infectives (From admission, onward)    Start     Dose/Rate Route Frequency Ordered Stop   05/01/22 1745  azithromycin (ZITHROMAX) 500 mg in sodium chloride 0.9 % 250 mL IVPB  Status:  Discontinued        500 mg 250 mL/hr over 60 Minutes Intravenous Every 24 hours 05/01/22 1657 05/03/22 1520   05/01/22 1657  cefTRIAXone (ROCEPHIN) 1 g in sodium chloride 0.9 % 100 mL IVPB  Status:  Discontinued        1 g 200 mL/hr over 30 Minutes Intravenous Every 24 hours 05/01/22 1657 05/03/22 1520       Subjective:  Patient seen and examined at bedside today.  Sitting at the edge of the bed.  Appears overall comfortable.  On 2 to 3 L of oxygen per minute.  Having some cough.  He denies worsening of cough or shortness of breath.   Objective: Vitals:   05/05/22 0329 05/05/22 0636 05/05/22 0821 05/05/22 1026  BP:  (!) 159/65  (!) 160/64  Pulse:  77  79  Resp:  18    Temp:  98.3 F (36.8 C)    TempSrc:  Oral    SpO2: 98% 94% 96% 91%  Weight:  84.6 kg    Height:        Intake/Output Summary (Last 24 hours) at 05/05/2022 1159 Last data filed at 05/04/2022 1213 Gross per 24 hour  Intake --  Output 2400 ml  Net -2400 ml   Filed Weights   05/04/22 0426 05/04/22 0830 05/05/22 0636  Weight: 87.7 kg 88.6 kg 84.6 kg    Examination:  General exam: Overall comfortable, not in distress, weak appearing HEENT: PERRL Respiratory system:  Diminished sounds bilaterally, no wheezing or crackles  cardiovascular system: S1 & S2 heard, RRR.  Gastrointestinal system: Abdomen is nondistended, soft and nontender. Central nervous system: Alert and oriented Extremities: No edema, no clubbing ,no cyanosis, dialysis access on left upper extremity Skin: No rashes, no ulcers,no icterus     Data Reviewed: I have personally reviewed following labs and imaging studies  CBC: Recent Labs  Lab 04/28/22 1955 04/29/22 0602 05/01/22 0113 05/02/22 0210 05/03/22 0330 05/04/22 0428 05/05/22 0150  WBC 5.4   < > 3.5* 4.0 3.5* 5.1 4.8  NEUTROABS 4.2  --   --  3.0 3.1 3.6 3.9  HGB 11.2*   < > 10.5* 10.2* 10.4* 10.9* 11.3*  HCT 35.3*   < > 32.2* 31.4* 31.5* 32.7* 33.8*  MCV 97.8   < > 95.3 94.9 95.5 94.2 93.9  PLT 60*   < > 58* 59* 69* 67* 72*   < > = values in this interval not displayed.   Basic Metabolic Panel: Recent Labs  Lab 04/29/22 0602 04/30/22 0203 05/01/22 0113 05/02/22 0210 05/03/22 0330 05/04/22 0428 05/05/22 0150  NA 141   < > 136 133* 134* 132* 133*  K 3.9   < > 4.2 4.2 4.7 4.1 4.9  CL 95*   < > 97* 98 97* 96* 96*  CO2 29   < > 30 26 26 24 27   GLUCOSE 104*   < > 118* 102* 145* 123* 110*  BUN 16   < > 23* 40* 28* 53* 41*  CREATININE 8.76*   < > 8.00* 10.28* 7.61* 9.76* 7.77*  CALCIUM 8.7*   < > 8.9 9.1 9.3 9.1 9.1  MG 2.0  --   --   --  2.0 2.1 2.0  PHOS 4.7*  --   --   --  2.9 4.4 3.5   < > = values in this interval not displayed.     Recent Results (from the past 240 hour(s))  Resp panel by RT-PCR (RSV, Flu A&B, Covid) Anterior Nasal Swab     Status: None   Collection Time: 04/28/22  7:42 PM   Specimen: Anterior Nasal Swab  Result Value Ref Range Status   SARS Coronavirus 2 by RT PCR NEGATIVE NEGATIVE Final   Influenza A by PCR NEGATIVE NEGATIVE Final   Influenza B by PCR NEGATIVE NEGATIVE Final    Comment: (NOTE) The Xpert Xpress SARS-CoV-2/FLU/RSV plus assay is intended as an aid in the diagnosis of  influenza from Nasopharyngeal swab specimens and should not be used as a sole basis for treatment. Nasal washings and aspirates are unacceptable for Xpert Xpress SARS-CoV-2/FLU/RSV testing.  Fact Sheet for Patients: BloggerCourse.com  Fact Sheet for Healthcare Providers: SeriousBroker.it  This test is not yet approved or cleared by the [place] FDA and has been authorized for detection and/or diagnosis of SARS-CoV-2 by FDA under an Emergency Use Authorization (EUA). This EUA will remain in effect (meaning this test can be used) for the duration of the COVID-19  declaration under Section 564(b)(1) of the Act, 21 U.S.C. section 360bbb-3(b)(1), unless the authorization is terminated or revoked.     Resp Syncytial Virus by PCR NEGATIVE NEGATIVE Final    Comment: (NOTE) Fact Sheet for Patients: BloggerCourse.com  Fact Sheet for Healthcare Providers: SeriousBroker.it  This test is not yet approved or cleared by the [place] FDA and has been authorized for detection and/or diagnosis of SARS-CoV-2 by FDA under an Emergency Use Authorization (EUA). This EUA will remain in effect (meaning this test can be used) for the duration of the COVID-19 declaration under Section 564(b)(1) of the Act, 21 U.S.C. section 360bbb-3(b)(1), unless the authorization is terminated or revoked.  Performed at [facility], [address], [place], [place] [address]   Culture, blood (Routine x 2)     Status: None   Collection Time: 04/28/22  7:55 PM   Specimen: BLOOD RIGHT FOREARM  Result Value Ref Range Status   Specimen Description BLOOD RIGHT FOREARM  Final   Special Requests   Final    BOTTLES DRAWN AEROBIC AND ANAEROBIC Blood Culture adequate volume   Culture   Final    NO GROWTH 5 DAYS Performed at [facility], [address], [place], [place] [address]    Report Status  05/03/2022 FINAL  Final  Culture, blood (Routine x 2)     Status: None   Collection Time: 04/28/22  8:14 PM   Specimen: BLOOD  Result Value Ref Range Status   Specimen Description BLOOD RIGHT ANTECUBITAL  Final   Special Requests   Final    BOTTLES DRAWN AEROBIC AND ANAEROBIC Blood Culture adequate volume   Culture   Final    NO GROWTH 5 DAYS Performed at [facility], [address] [place], [place] [address]    Report Status 05/03/2022 FINAL  Final  Respiratory (~20 pathogens) panel by PCR     Status: Abnormal   Collection Time: 04/29/22  4:15 AM   Specimen: Nasopharyngeal Swab; Respiratory  Result Value Ref Range Status   Adenovirus NOT DETECTED NOT DETECTED Final   Coronavirus 229E NOT DETECTED NOT DETECTED Final    Comment: (NOTE) The Coronavirus on the Respiratory Panel, DOES NOT test for the novel  Coronavirus (2019 nCoV)    Coronavirus HKU1 NOT DETECTED NOT DETECTED Final   Coronavirus NL63 NOT DETECTED NOT DETECTED Final   Coronavirus OC43 NOT DETECTED NOT DETECTED Final   Metapneumovirus NOT DETECTED NOT DETECTED Final   Rhinovirus / Enterovirus DETECTED (A) NOT DETECTED Final   Influenza A NOT DETECTED NOT DETECTED Final   Influenza B NOT DETECTED NOT DETECTED Final   Parainfluenza Virus 1 NOT DETECTED NOT DETECTED Final   Parainfluenza Virus 2 NOT DETECTED NOT DETECTED Final   Parainfluenza Virus 3 NOT DETECTED NOT DETECTED Final   Parainfluenza Virus 4 NOT DETECTED NOT DETECTED Final   Respiratory Syncytial Virus NOT DETECTED NOT DETECTED Final   Bordetella pertussis NOT DETECTED NOT DETECTED Final   Bordetella Parapertussis NOT DETECTED NOT DETECTED Final   Chlamydophila pneumoniae NOT DETECTED NOT DETECTED Final   Mycoplasma pneumoniae NOT DETECTED NOT DETECTED Final    Comment: Performed at [facility], [address] [place], [place] [address]     Radiology Studies: DG CHEST PORT 1 VIEW  Result Date: 05/05/2022 CLINICAL DATA:  [id number]  Hypoxia [id number] EXAM: PORTABLE CHEST - 1 VIEW COMPARISON:  04/28/2022 FINDINGS: The heart size and mediastinal contours are within normal limits. There  is diffuse pulmonary interstitial prominence which could be seen with atypical infection, edema or interstitial lung disease. There is no focal consolidation. No pneumothorax or pleural effusion. Aorta is calcified. There are thoracic degenerative changes. IMPRESSION: Nonspecific interstitial prominence consistent with atypical infection, edema or interstitial lung disease. No focal consolidation. Electronically Signed   By: Layla Maw M.D.   On: 05/05/2022 09:34    Scheduled Meds:  amLODipine  10 mg Oral QPM   dextromethorphan-guaiFENesin  1 tablet Oral BID   doxazosin  8 mg Oral Daily   DULoxetine  30 mg Oral Daily   heparin injection (subcutaneous)  5,000 Units Subcutaneous Q8H   hydrALAZINE  100 mg Oral Q8H   ipratropium-albuterol  3 mL Nebulization Q6H   irbesartan  150 mg Oral Daily   isosorbide mononitrate  90 mg Oral Daily   lanthanum  1,000 mg Oral TID WC   levETIRAcetam  250 mg Oral Q M,W,F-HD   methylPREDNISolone (SOLU-MEDROL) injection  60 mg Intravenous Daily   metoprolol tartrate  100 mg Oral BID   pravastatin  40 mg Oral q1800   sodium chloride flush  3 mL Intravenous Q12H   sodium chloride flush  3 mL Intravenous Q12H   sodium chloride HYPERTONIC  4 mL Nebulization BID   traZODone  50 mg Oral QHS   Continuous Infusions:  sodium chloride       LOS: 6 days   Burnadette Pop, MD [facility] P4/13/2024, 11:59 AM

## 2022-05-05 NOTE — Progress Notes (Signed)
Mobility Specialist Progress Note    05/05/22 1353  Mobility  Activity Ambulated independently in hallway  Level of Assistance Modified independent, requires aide device or extra time  Assistive Device Four wheel walker  Distance Ambulated (ft) 60 ft (30+30)  Activity Response Tolerated fair  Mobility Referral Yes  $Mobility charge 1 Mobility   Pre-Mobility: 72 HR Post-Mobility: 72 HR, 95% SpO2  Pt received in bed and agreeable. C/o being SOB. SpO2 on RA reading 90-94% with a good pleth. Took x2 seated rest breaks. Returned to bed with call bell in reach.   [place] Mobility Specialist  Please Neurosurgeon or Rehab Office at [phone]

## 2022-05-05 NOTE — Progress Notes (Signed)
[facility] Progress Note   Subjective:   Still on CPAP, O2 sat around 90-91%. Reports he continues to feel SOB and coughing a lot. Does not feel he has extra fluid on, weight is down 4kg. Repeat CXR with nonspecific interstitial prominence. He denies CP, palpitations, dizziness and nausea.   Objective Vitals:   05/04/22 2024 05/05/22 0329 05/05/22 0636 05/05/22 0821  BP: (!) 151/58  (!) 159/65   Pulse: 77  77   Resp: 20  18   Temp: 98.2 F (36.8 C)  98.3 F (36.8 C)   TempSrc: Oral  Oral   SpO2: 96% 98% 94% 96%  Weight:   84.6 kg   Height:       Physical Exam General: Alert male in CPAP in NAD Heart: RRR, no murmurs, rubs or gallops Lungs: CTA bilaterally without wheezing, rhonchi or rales Abdomen: Soft, non-distended, +BS  Extremities: No edema b/l lower extremities Dialysis Access: LUE AVF + bruit  Additional Objective Labs: Basic Metabolic Panel: Recent Labs  Lab 05/03/22 0330 05/04/22 0428 05/05/22 0150  NA 134* 132* 133*  K 4.7 4.1 4.9  CL 97* 96* 96*  CO2 26 24 27   GLUCOSE 145* 123* 110*  BUN 28* 53* 41*  CREATININE 7.61* 9.76* 7.77*  CALCIUM 9.3 9.1 9.1  PHOS 2.9 4.4 3.5   Liver Function Tests: Recent Labs  Lab 05/03/22 0330 05/04/22 0428 05/05/22 0150  AST 13* 11* 16  ALT 13 11 15   ALKPHOS 49 46 50  BILITOT 0.7 0.9 1.1  PROT 6.2* 5.6* 6.0*  ALBUMIN 3.3* 3.0* 3.3*   No results for input(s): "LIPASE", "AMYLASE" in the last 168 hours. CBC: Recent Labs  Lab 05/01/22 0113 05/02/22 0210 05/03/22 0330 05/04/22 0428 05/05/22 0150  WBC 3.5* 4.0 3.5* 5.1 4.8  NEUTROABS  --  3.0 3.1 3.6 3.9  HGB 10.5* 10.2* 10.4* 10.9* 11.3*  HCT 32.2* 31.4* 31.5* 32.7* 33.8*  MCV 95.3 94.9 95.5 94.2 93.9  PLT 58* 59* 69* 67* 72*   Blood Culture    Component Value Date/Time   SDES BLOOD RIGHT ANTECUBITAL 04/28/2022 2014   SPECREQUEST  04/28/2022 2014    BOTTLES DRAWN AEROBIC AND ANAEROBIC Blood Culture adequate volume   CULT  04/28/2022  2014    NO GROWTH 5 DAYS Performed at [facility], [address] [place], [place] [address]    REPTSTATUS 05/03/2022 FINAL 04/28/2022 2014    Cardiac Enzymes: No results for input(s): "CKTOTAL", "CKMB", "CKMBINDEX", "TROPONINI" in the last 168 hours. CBG: Recent Labs  Lab 04/30/22 2202  GLUCAP 126*   Iron Studies: No results for input(s): "IRON", "TIBC", "TRANSFERRIN", "FERRITIN" in the last 72 hours. @lablastinr3 @ Studies/Results: DG CHEST PORT 1 VIEW  Result Date: 05/05/2022 CLINICAL DATA:  200808 Hypoxia 220254 EXAM: PORTABLE CHEST - 1 VIEW COMPARISON:  04/28/2022 FINDINGS: The heart size and mediastinal contours are within normal limits. There is diffuse pulmonary interstitial prominence which could be seen with atypical infection, edema or interstitial lung disease. There is no focal consolidation. No pneumothorax or pleural effusion. Aorta is calcified. There are thoracic degenerative changes. IMPRESSION: Nonspecific interstitial prominence consistent with atypical infection, edema or interstitial lung disease. No focal consolidation. Electronically Signed   By: Layla Maw M.D.   On: 05/05/2022 09:34   Medications:  sodium chloride      amLODipine  10 mg Oral QPM   dextromethorphan-guaiFENesin  1 tablet Oral BID   doxazosin  8 mg Oral Daily   DULoxetine  30 mg Oral Daily   heparin injection (subcutaneous)  5,000 Units Subcutaneous Q8H   hydrALAZINE  100 mg Oral Q8H   ipratropium-albuterol  3 mL Nebulization Q6H   irbesartan  150 mg Oral Daily   isosorbide mononitrate  90 mg Oral Daily   lanthanum  1,000 mg Oral TID WC   levETIRAcetam  250 mg Oral Q M,W,F-HD   methylPREDNISolone (SOLU-MEDROL) injection  60 mg Intravenous Daily   metoprolol tartrate  100 mg Oral BID   pravastatin  40 mg Oral q1800   sodium chloride flush  3 mL Intravenous Q12H   sodium chloride flush  3 mL Intravenous Q12H   sodium chloride HYPERTONIC  4 mL Nebulization BID   traZODone  50  mg Oral QHS    Outpatient Dialysis Orders: MWF NW 4:15hr, EDW 86.4kg, 2K/2Ca bath, LUE AVF, Heparin 2500 unit bolus - venofer 50 mg weekly - mircera 200 mcg IV q 2 wks, last 4/01, due 4/15 - rocaltrol 1.25 mcg po tiw  Assessment/Plan: Acute hypoxic resp failure/rhinovirus: Still requiring O2, chest CT 4/9 showing mucus plugging - getting prednisone, bronchodilators, cough meds. CXR today non-specific. No improvement with HD. He is below his outpatient EDW and does not have any edema, lower suspicion for volume overload but will continue to challenge as tolerated . ESRD: Continue HD on MWF schedule -> HD Monday HTN urgency: Improving slowly, appears euvolemic on exam but with HTN - challenging EDW as tolerated. 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 not due for ESA yet. Secondary HTPH: Ca/Phos ok - continue lanthanum binder.  Hx seizure disorder: Continue home meds.  Rogers Blocker, PA-C 05/05/2022, 10:10 AM  [employer] Pager: [phone]

## 2022-05-05 NOTE — Progress Notes (Signed)
Pt called reporting panic attack while on nebulizer.  Lungs with wheezes noted, tachypnea.  Paged [facility] for prn medications.  Provided hs meds per request while awaiting return call.  Pt currently lying in bed with cpap on.  Stated he feels better.  Will continue to monitor closely.

## 2022-05-06 DIAGNOSIS — I169 Hypertensive crisis, unspecified: Secondary | ICD-10-CM | POA: Diagnosis not present

## 2022-05-06 LAB — CBC WITH DIFFERENTIAL/PLATELET
Abs Immature Granulocytes: 0.03 10*3/uL (ref 0.00–0.07)
Basophils Absolute: 0 10*3/uL (ref 0.0–0.1)
Basophils Relative: 0 %
Eosinophils Absolute: 0 10*3/uL (ref 0.0–0.5)
Eosinophils Relative: 0 %
HCT: 34.5 % — ABNORMAL LOW (ref 39.0–52.0)
Hemoglobin: 11.1 g/dL — ABNORMAL LOW (ref 13.0–17.0)
Immature Granulocytes: 1 %
Lymphocytes Relative: 13 %
Lymphs Abs: 0.8 10*3/uL (ref 0.7–4.0)
MCH: 30.4 pg (ref 26.0–34.0)
MCHC: 32.2 g/dL (ref 30.0–36.0)
MCV: 94.5 fL (ref 80.0–100.0)
Monocytes Absolute: 0.6 10*3/uL (ref 0.1–1.0)
Monocytes Relative: 9 %
Neutro Abs: 4.6 10*3/uL (ref 1.7–7.7)
Neutrophils Relative %: 77 %
Platelets: 82 10*3/uL — ABNORMAL LOW (ref 150–400)
RBC: 3.65 MIL/uL — ABNORMAL LOW (ref 4.22–5.81)
RDW: 16.5 % — ABNORMAL HIGH (ref 11.5–15.5)
WBC: 6 10*3/uL (ref 4.0–10.5)
nRBC: 0 % (ref 0.0–0.2)

## 2022-05-06 LAB — COMPREHENSIVE METABOLIC PANEL
ALT: 17 U/L (ref 0–44)
AST: 15 U/L (ref 15–41)
Albumin: 3.2 g/dL — ABNORMAL LOW (ref 3.5–5.0)
Alkaline Phosphatase: 48 U/L (ref 38–126)
Anion gap: 11 (ref 5–15)
BUN: 66 mg/dL — ABNORMAL HIGH (ref 6–20)
CO2: 25 mmol/L (ref 22–32)
Calcium: 9.4 mg/dL (ref 8.9–10.3)
Chloride: 97 mmol/L — ABNORMAL LOW (ref 98–111)
Creatinine, Ser: 10.44 mg/dL — ABNORMAL HIGH (ref 0.61–1.24)
GFR, Estimated: 5 mL/min — ABNORMAL LOW (ref >60)
Glucose, Bld: 117 mg/dL — ABNORMAL HIGH (ref 70–99)
Potassium: 5.1 mmol/L (ref 3.5–5.1)
Sodium: 133 mmol/L — ABNORMAL LOW (ref 135–145)
Total Bilirubin: 1.4 mg/dL — ABNORMAL HIGH (ref 0.3–1.2)
Total Protein: 5.6 g/dL — ABNORMAL LOW (ref 6.5–8.1)

## 2022-05-06 LAB — EXPECTORATED SPUTUM ASSESSMENT W GRAM STAIN, RFLX TO RESP C

## 2022-05-06 LAB — MAGNESIUM: Magnesium: 2 mg/dL (ref 1.7–2.4)

## 2022-05-06 LAB — PHOSPHORUS: Phosphorus: 4.4 mg/dL (ref 2.5–4.6)

## 2022-05-06 MED ORDER — CHLORHEXIDINE GLUCONATE CLOTH 2 % EX PADS
6.0000 | MEDICATED_PAD | Freq: Every day | CUTANEOUS | Status: DC
  Administered 2022-05-06: 6 via TOPICAL

## 2022-05-06 NOTE — Progress Notes (Signed)
Notified by [facility], sputum specimen sent was rejected and will need to be recollected. Dierdre Highman, RN

## 2022-05-06 NOTE — Plan of Care (Signed)

## 2022-05-06 NOTE — Progress Notes (Signed)
Kirk [name] EBR:[id number] DOB: 11-Jan-1961 DOA: 04/28/2022 PCP: Caesar Bookman, NP   Subj: Kirk [name] is a 62 y.o. 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  CT is negative for acute intracranial abnormality. Labs are notable for normal lactic acid, normal WBC, platelets 60,000, normal potassium, normal serum bicarbonate, and normal BUN. COVID, influenza, and RSV PCR are negative.  Respiratory panel shows rhinovirus positive.    Obj: 4/14 afebrile overnight, A/O x 4, continued positive SOB but feels that it is improving.  Negative nausea, negative chest pain     Objective: VITAL SIGNS: Temp: 97.7 F (36.5 C) (04/14 0449) Temp Source: Oral (04/14 0449) BP: 155/54 (04/14 0635) Pulse Rate: 64 (04/14 0635)   VENTILATOR SETTINGS: Nasal cannula 4/14 Flow 3 L/min SpO2 97%   Intake/Output Summary (Last 24 hours) at 05/06/2022 0915 Last data filed at 05/06/2022 0600 Gross per 24 hour  Intake 460 ml  Output --  Net 460 ml    Physical Exam:  General: A/O x 4, positive acute respiratory distress Eyes: negative scleral hemorrhage, negative anisocoria, negative icterus ENT: Negative Runny nose, negative gingival bleeding, Neck:  Negative scars, masses, torticollis, lymphadenopathy, JVD Lungs: Clear to auscultation bilaterally without wheezes or crackles Cardiovascular: Regular rate and rhythm without murmur gallop or rub normal S1 and S2 Abdomen: negative abdominal pain, nondistended, positive soft, bowel sounds, no rebound, no ascites, no appreciable mass Extremities: No significant cyanosis, clubbing, or edema bilateral lower extremities Skin: Negative rashes, lesions, ulcers Psychiatric:  Negative depression, negative anxiety, negative fatigue, negative mania  Central nervous system:  Cranial nerves  II through XII intact, tongue/uvula midline, all extremities muscle strength 5/5, sensation intact throughout, negative dysarthria, negative expressive aphasia, negative receptive aphasia.     Mobility Assessment (last 72 hours)     Mobility Assessment     Row Name 05/06/22 0745 05/05/22 2041 05/04/22 1954 05/04/22 1502 05/03/22 1500   Does patient have an order for bedrest or is patient medically unstable No - Continue assessment No - Continue assessment No - Continue assessment -- --   What is the highest level of mobility based on the progressive mobility assessment? Level 6 (Walks independently in room and hall) - Balance while walking in room without assist - Complete Level 5 (Walks with assist in room/hall) - Balance while stepping forward/back and can walk in room with assist - Complete Level 5 (Walks with assist in room/hall) - Balance while stepping forward/back and can walk in room with assist - Complete Level 5 (Walks with assist in room/hall) - Balance while stepping forward/back and can walk in room with assist - Complete Level 5 (Walks with assist in room/hall) - Balance while stepping forward/back and can walk in room with assist - Complete              DVT prophylaxis:  Code Status: Full Family Communication: 4/12 spoke with [name] wife  Status is: Inpatient    Dispo: The patient is from: Home              Anticipated d/c is to: Home              Anticipated d/c date is: 3 days              Patient currently is not medically stable to d/c.  Procedures/Significant Events: 4/9 CT chest W. Wo contrast -ucous plugging, mild consolidations and numerous small pulmonary nodules most pronounced in the lower lungs, slightly worsened when compared with the prior exam. Findings are likely due to infection or aspiration. 2. Severe coronary artery calcifications.  Consultants:     Cultures 4/6 influenza A/B negative 4/6 RSV negative 4/6 SARS coronavirus  negative 4/7 respiratory virus panel positive rhinovirus  Antimicrobials:    Assessment & Plan: Covid vaccination;   Principal Problem:   Hypertensive crisis Active Problems:   Seizure   Thrombocytopenia   ESRD on dialysis   Acute respiratory failure with hypoxia   Secondary central sleep apnea  Acute respiratory failure with hypoxia -Continuous pulse ox - Titrate O2 to maintain SpO2> 93% -Obtain ambulatory SpO2 in a.m. ADDENDUM 4/11 hold given patient's continued high O2 demand. -4/12 see pneumonia  Positive Rhinovirus pneumonia -DuoNeb QID -Solu-Medrol 60 mg daily - Incentive spirometry - Flutter valve - Mucinex DM BID -4/11 hypertonic saline nebulizer BID.  Following each nebulizer treatment treat with DuoNeb -4/12 continue supportive care -4/14 patient understands that may need to go home on O2.  Will give him another 48 hours to determine if he can be weaned off O2 prior to discharge.  OSA - CPAP per respiratory therapy  HTN crisis -Amlodipine 10 mg daily -Metoprolol 100 mg BID -Resolved most likely secondary to requiring HD, and rhinovirus pneumonia  ESRD on HD M/W/F -HD per nephrology -4/12 last HD session today  Chronic thrombocytopenia -Monitor closely, currently no sign of acute bleed -4/11 slight trend upward  Anemia of chronic disease -Anemia panel c/w anemia of chronic DZ -Transfuse for hemoglobin<7  Seizures/headache -Keppra 250 mg M/W/F with HD  HLD -Pravastatin 40 mg daily - Lipid panel pending  Obesity (BMI 30.7 kg/m) -Follow-up with PCP    Mobility Assessment (last 72 hours)     Mobility Assessment     Row Name 05/06/22 0745 05/05/22 2041 05/04/22 1954 05/04/22 1502 05/03/22 1500   Does patient have an order for bedrest or is patient medically unstable No - Continue assessment No - Continue assessment No - Continue assessment -- --   What is the highest level of mobility based on the progressive mobility assessment? Level 6  (Walks independently in room and hall) - Balance while walking in room without assist - Complete Level 5 (Walks with assist in room/hall) - Balance while stepping forward/back and can walk in room with assist - Complete Level 5 (Walks with assist in room/hall) - Balance while stepping forward/back and can walk in room with assist - Complete Level 5 (Walks with assist in room/hall) - Balance while stepping forward/back and can walk in room with assist - Complete Level 5 (Walks with assist in room/hall) - Balance while stepping forward/back and can walk in room with assist - Complete                 Time: 35 minutes         Care during the described time interval was provided by me .  I have reviewed this patient's available data, including medical history, events of note, physical examination, and all test results as part of my evaluation.

## 2022-05-06 NOTE — Progress Notes (Signed)
[facility] Progress Note   Subjective:   Seen in room, on CPAP. Feels like his breathing is a little bit better today. Denies fever, chills, CP, palpitations and nausea.   Objective Vitals:   05/06/22 0300 05/06/22 0449 05/06/22 0616 05/06/22 0635  BP:  (!) 192/66 (!) 187/69 (!) 155/54  Pulse:  83 64 64  Resp:  (!) 22    Temp:  97.7 F (36.5 C)    TempSrc:  Oral    SpO2: 100%  98% 96%  Weight:  85.6 kg    Height:       Physical Exam General: Alert male  in NAD Heart: RRR, no murmurs, rubs or gallops Lungs: CTA bilaterally without wheezing, rhonchi or rales Abdomen: Soft, non-distended, +BS  Extremities: No edema b/l lower extremities Dialysis Access: LUE AVF + bruit    Additional Objective Labs: Basic Metabolic Panel: Recent Labs  Lab 05/04/22 0428 05/05/22 0150 05/06/22 0300  NA 132* 133* 133*  K 4.1 4.9 5.1  CL 96* 96* 97*  CO2 24 27 25   GLUCOSE 123* 110* 117*  BUN 53* 41* 66*  CREATININE 9.76* 7.77* 10.44*  CALCIUM 9.1 9.1 9.4  PHOS 4.4 3.5 4.4   Liver Function Tests: Recent Labs  Lab 05/04/22 0428 05/05/22 0150 05/06/22 0300  AST 11* 16 15  ALT 11 15 17   ALKPHOS 46 50 48  BILITOT 0.9 1.1 1.4*  PROT 5.6* 6.0* 5.6*  ALBUMIN 3.0* 3.3* 3.2*   No results for input(s): "LIPASE", "AMYLASE" in the last 168 hours. CBC: Recent Labs  Lab 05/02/22 0210 05/03/22 0330 05/04/22 0428 05/05/22 0150 05/06/22 0300  WBC 4.0 3.5* 5.1 4.8 6.0  NEUTROABS 3.0 3.1 3.6 3.9 4.6  HGB 10.2* 10.4* 10.9* 11.3* 11.1*  HCT 31.4* 31.5* 32.7* 33.8* 34.5*  MCV 94.9 95.5 94.2 93.9 94.5  PLT 59* 69* 67* 72* 82*   Blood Culture    Component Value Date/Time   SDES BLOOD RIGHT ANTECUBITAL 04/28/2022 2014   SPECREQUEST  04/28/2022 2014    BOTTLES DRAWN AEROBIC AND ANAEROBIC Blood Culture adequate volume   CULT  04/28/2022 2014    NO GROWTH 5 DAYS Performed at [facility], [address] [place], [place] [address]    REPTSTATUS 05/03/2022 FINAL  04/28/2022 2014    CBG: Recent Labs  Lab 04/30/22 2202  GLUCAP 126*    Studies/Results: DG CHEST PORT 1 VIEW  Result Date: 05/05/2022 CLINICAL DATA:  200808 Hypoxia 200808 EXAM: PORTABLE CHEST - 1 VIEW COMPARISON:  04/28/2022 FINDINGS: The heart size and mediastinal contours are within normal limits. There is diffuse pulmonary interstitial prominence which could be seen with atypical infection, edema or interstitial lung disease. There is no focal consolidation. No pneumothorax or pleural effusion. Aorta is calcified. There are thoracic degenerative changes. IMPRESSION: Nonspecific interstitial prominence consistent with atypical infection, edema or interstitial lung disease. No focal consolidation. Electronically Signed   By: Layla Maw M.D.   On: 05/05/2022 09:34   Medications:  sodium chloride     cefTRIAXone (ROCEPHIN)  IV Stopped (05/05/22 1346)    amLODipine  10 mg Oral QPM   azithromycin  500 mg Oral Daily   dextromethorphan-guaiFENesin  1 tablet Oral BID   doxazosin  8 mg Oral Daily   DULoxetine  30 mg Oral Daily   heparin injection (subcutaneous)  5,000 Units Subcutaneous Q8H   hydrALAZINE  100 mg Oral Q8H   ipratropium-albuterol  3 mL Nebulization BID   irbesartan  150 mg Oral Daily   isosorbide mononitrate  90 mg Oral Daily   lanthanum  1,000 mg Oral TID WC   levETIRAcetam  250 mg Oral Q M,W,F-HD   metoprolol tartrate  100 mg Oral BID   pravastatin  40 mg Oral q1800   predniSONE  40 mg Oral Q breakfast   sodium chloride flush  3 mL Intravenous Q12H   sodium chloride flush  3 mL Intravenous Q12H   sodium chloride HYPERTONIC  4 mL Nebulization BID   traZODone  50 mg Oral QHS    Outpatient Dialysis Orders: MWF NW 4:15hr, EDW 86.4kg, 2K/2Ca bath, LUE AVF, Heparin 2500 unit bolus - venofer 50 mg weekly - mircera 200 mcg IV q 2 wks, last 4/01, due 4/15 - rocaltrol 1.25 mcg po tiw  Assessment/Plan: Acute hypoxic resp failure/rhinovirus: Still requiring O2,  chest CT 4/9 showing mucus plugging - getting prednisone, bronchodilators, cough meds. CXR 05/05/22 atypical infection vs edema. Back on antibiotics. He is below his outpatient EDW and does not have any edema, lower suspicion for volume overload but will continue to challenge as tolerated . ESRD: Continue HD on MWF schedule -> HD Monday HTN urgency: Improving slowly, appears euvolemic on exam and hypertension improving, challenging EDW as tolerated.  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 not due for ESA yet. Secondary HTPH: Phos controlled, continue lanthanum binder. Ca a bit elevated, not getting VDRA  Hx seizure disorder: Continue home meds.      Rogers Blocker, PA-C 05/06/2022, 8:07 AM  [facility] Pager: [phone]

## 2022-05-07 DIAGNOSIS — N186 End stage renal disease: Secondary | ICD-10-CM | POA: Diagnosis not present

## 2022-05-07 DIAGNOSIS — D696 Thrombocytopenia, unspecified: Secondary | ICD-10-CM | POA: Diagnosis not present

## 2022-05-07 DIAGNOSIS — I169 Hypertensive crisis, unspecified: Secondary | ICD-10-CM | POA: Diagnosis not present

## 2022-05-07 DIAGNOSIS — R569 Unspecified convulsions: Secondary | ICD-10-CM | POA: Diagnosis not present

## 2022-05-07 LAB — CBC WITH DIFFERENTIAL/PLATELET
Abs Immature Granulocytes: 0.06 10*3/uL (ref 0.00–0.07)
Basophils Absolute: 0 10*3/uL (ref 0.0–0.1)
Basophils Relative: 0 %
Eosinophils Absolute: 0 10*3/uL (ref 0.0–0.5)
Eosinophils Relative: 0 %
HCT: 36.5 % — ABNORMAL LOW (ref 39.0–52.0)
Hemoglobin: 12.4 g/dL — ABNORMAL LOW (ref 13.0–17.0)
Immature Granulocytes: 1 %
Lymphocytes Relative: 12 %
Lymphs Abs: 0.8 10*3/uL (ref 0.7–4.0)
MCH: 31.5 pg (ref 26.0–34.0)
MCHC: 34 g/dL (ref 30.0–36.0)
MCV: 92.6 fL (ref 80.0–100.0)
Monocytes Absolute: 0.5 10*3/uL (ref 0.1–1.0)
Monocytes Relative: 8 %
Neutro Abs: 5 10*3/uL (ref 1.7–7.7)
Neutrophils Relative %: 79 %
Platelets: 94 10*3/uL — ABNORMAL LOW (ref 150–400)
RBC: 3.94 MIL/uL — ABNORMAL LOW (ref 4.22–5.81)
RDW: 16.5 % — ABNORMAL HIGH (ref 11.5–15.5)
WBC: 6.3 10*3/uL (ref 4.0–10.5)
nRBC: 0 % (ref 0.0–0.2)

## 2022-05-07 LAB — COMPREHENSIVE METABOLIC PANEL
ALT: 15 U/L (ref 0–44)
AST: 11 U/L — ABNORMAL LOW (ref 15–41)
Albumin: 3.4 g/dL — ABNORMAL LOW (ref 3.5–5.0)
Alkaline Phosphatase: 51 U/L (ref 38–126)
Anion gap: 17 — ABNORMAL HIGH (ref 5–15)
BUN: 98 mg/dL — ABNORMAL HIGH (ref 6–20)
CO2: 22 mmol/L (ref 22–32)
Calcium: 9.7 mg/dL (ref 8.9–10.3)
Chloride: 96 mmol/L — ABNORMAL LOW (ref 98–111)
Creatinine, Ser: 12.34 mg/dL — ABNORMAL HIGH (ref 0.61–1.24)
GFR, Estimated: 4 mL/min — ABNORMAL LOW (ref >60)
Glucose, Bld: 102 mg/dL — ABNORMAL HIGH (ref 70–99)
Potassium: 5.5 mmol/L — ABNORMAL HIGH (ref 3.5–5.1)
Sodium: 135 mmol/L (ref 135–145)
Total Bilirubin: 1.4 mg/dL — ABNORMAL HIGH (ref 0.3–1.2)
Total Protein: 5.9 g/dL — ABNORMAL LOW (ref 6.5–8.1)

## 2022-05-07 LAB — MAGNESIUM: Magnesium: 2.2 mg/dL (ref 1.7–2.4)

## 2022-05-07 LAB — PHOSPHORUS: Phosphorus: 4.6 mg/dL (ref 2.5–4.6)

## 2022-05-07 MED ORDER — HEPARIN SODIUM (PORCINE) 1000 UNIT/ML IJ SOLN
INTRAMUSCULAR | Status: AC
  Administered 2022-05-07: 2000 [IU]
  Filled 2022-05-07: qty 2

## 2022-05-07 MED ORDER — ISOSORBIDE MONONITRATE ER 60 MG PO TB24
120.0000 mg | ORAL_TABLET | Freq: Every day | ORAL | Status: DC
  Administered 2022-05-08 – 2022-05-09 (×2): 120 mg via ORAL
  Filled 2022-05-07 (×2): qty 2

## 2022-05-07 MED ORDER — ISOSORBIDE MONONITRATE ER 30 MG PO TB24
30.0000 mg | ORAL_TABLET | Freq: Once | ORAL | Status: AC
  Administered 2022-05-07: 30 mg via ORAL
  Filled 2022-05-07: qty 1

## 2022-05-07 MED ORDER — CHLORHEXIDINE GLUCONATE CLOTH 2 % EX PADS
6.0000 | MEDICATED_PAD | Freq: Every day | CUTANEOUS | Status: DC
  Administered 2022-05-07 – 2022-05-08 (×2): 6 via TOPICAL

## 2022-05-07 NOTE — Progress Notes (Signed)
  [facility] Progress Note   Subjective:   Seen in room, on [place] O2. No c/o's.   Objective Vitals:   05/07/22 1200 05/07/22 1230 05/07/22 1330 05/07/22 1345  BP: (!) 184/72 (!) 186/71 (!) 176/80 (!) 165/71  Pulse: 76 71 72 69  Resp: 19 20 20  (!) 22  Temp:    98.4 F (36.9 C)  TempSrc:      SpO2: 100% 100% 100% 100%  Weight:      Height:       Physical Exam General: Alert male  in NAD Heart: RRR, no murmurs, rubs or gallops Lungs: CTA bilaterally without wheezing, rhonchi or rales Abdomen: Soft, non-distended, +BS  Extremities: No edema b/l lower extremities Dialysis Access: LUE AVF + bruit   OP HD: MWF NW 4h   86.4kg   2/2 bath    LUE AVF   Heparin 2500  - venofer 50 mg weekly - mircera 200 mcg IV q 2 wks, last 4/01, due 4/15 - rocaltrol 1.25 mcg po tiw  Assessment/Plan: Acute hypoxic resp failure/rhinovirus-  Still requiring O2, chest CT 4/9 showed mucus plugging - getting prednisone, bronchodilators, cough meds. CXR 05/05/22 atypical infection vs edema. Back on antibiotics. He is at dry wt and does not have any edema, lower suspicion for volume overload but will continue to challenge. 3 L removed today. ESRD: Continue HD on MWF. HD today.  HTN urgency: BP's remain a bit high on hydralazine, doxazosin, irbesartan and metoprolol. Appears euvolemic on exam. At dry wt, lowest here was 85kg. Cont to lower as tolerated.  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 not due for ESA yet. Secondary HTPH: Phos controlled, continue lanthanum binder. Ca a bit elevated, not getting VDRA  Hx seizure disorder: Continue home meds.      Kirk Moselle, MD 05/07/2022, 2:40 PM  Recent Labs  Lab 05/06/22 0300 05/07/22 0327  HGB 11.1* 12.4*  ALBUMIN 3.2* 3.4*  CALCIUM 9.4 9.7  PHOS 4.4 4.6  CREATININE 10.44* 12.34*  K 5.1 5.5*    Inpatient medications:  amLODipine  10 mg Oral QPM   azithromycin  500 mg Oral Daily   Chlorhexidine Gluconate Cloth  6 each Topical Daily    dextromethorphan-guaiFENesin  1 tablet Oral BID   doxazosin  8 mg Oral Daily   DULoxetine  30 mg Oral Daily   heparin injection (subcutaneous)  5,000 Units Subcutaneous Q8H   hydrALAZINE  100 mg Oral Q8H   ipratropium-albuterol  3 mL Nebulization BID   irbesartan  150 mg Oral Daily   [START ON 05/08/2022] isosorbide mononitrate  120 mg Oral Daily   isosorbide mononitrate  30 mg Oral Once   lanthanum  1,000 mg Oral TID WC   levETIRAcetam  250 mg Oral Q M,W,F-HD   metoprolol tartrate  100 mg Oral BID   pravastatin  40 mg Oral q1800   predniSONE  40 mg Oral Q breakfast   sodium chloride flush  3 mL Intravenous Q12H   sodium chloride flush  3 mL Intravenous Q12H   sodium chloride HYPERTONIC  4 mL Nebulization BID   traZODone  50 mg Oral QHS    sodium chloride     cefTRIAXone (ROCEPHIN)  IV 2 g (05/06/22 1331)   sodium chloride, acetaminophen, benzonatate, fentaNYL (SUBLIMAZE) injection, hydrALAZINE, ondansetron **OR** ondansetron (ZOFRAN) IV, sodium chloride flush

## 2022-05-07 NOTE — Progress Notes (Signed)
   05/07/22 1345  Vitals  Temp 98.4 F (36.9 C)  Pulse Rate 69  Resp (!) 22  BP (!) 165/71  SpO2 100 %  O2 Device Nasal Cannula  Oxygen Therapy  O2 Flow Rate (L/min) 5 L/min  Patient Activity (if Appropriate) In bed  Pulse Oximetry Type Continuous  Post Treatment  Dialyzer Clearance Clear  Duration of HD Treatment -hour(s) 3.5 hour(s)  Hemodialysis Intake (mL) 0 mL  Liters Processed 84.9  Fluid Removed (mL) 3000 mL  Tolerated HD Treatment Yes  Post-Hemodialysis Comments Pt. tolerated procedure well. Pt. voice no complaints  AVG/AVF Arterial Site Held (minutes) 8 minutes  AVG/AVF Venous Site Held (minutes) 8 minutes   Received patient in bed to unit.  Alert and oriented.  Informed consent signed and in chart.   TX duration:  Patient tolerated well.  Transported back to the room  Alert, without acute distress.  Hand-off given to patient's nurse.   Access used: Yes Access issues: None  Total UF removed: 3000 Medication(s) given: Heparin 2000 bolus Post HD VS: See Above Grid Post HD weight: 84.9 kg   [facility]

## 2022-05-07 NOTE — Progress Notes (Signed)
Physical Therapy Treatment Patient Details Name: Kirk [name] MRN: [id number] DOB: 1960/03/17 Today's Date: 05/07/2022   History of Present Illness Pt is 62 year old presented to [facility] on  04/28/22 for SOB, cough, and headache. BP 224/93. SpO2 89%. Adm with hypertensive crisis and acute respiratory failure. PMH - ESRD on HD, failed kidney transplant, htn, anxiety, OSA, SDH, seizure    PT Comments    Pt tolerated treatment well today. Pt reports feeling much better today compared to Friday now after dialysis. Pt was able to ambulate in hallway with rollator and navigate stairs at supervision level requiring minimal cues for locking rollator brakes. Pt has met all acute PT goals and PT will be signing off. Re consult PT if mobility status changes.   Recommendations for follow up therapy are one component of a multi-disciplinary discharge planning process, led by the attending physician.  Recommendations may be updated based on patient status, additional functional criteria and insurance authorization.  Follow Up Recommendations       Assistance Recommended at Discharge PRN  Patient can return home with the following Assist for transportation   Equipment Recommendations  Rollator (4 wheels)    Recommendations for Other Services       Precautions / Restrictions Precautions Precautions: Fall Restrictions Weight Bearing Restrictions: No     Mobility  Bed Mobility               General bed mobility comments: Pt received seated EOB    Transfers Overall transfer level: Needs assistance Equipment used: Rollator (4 wheels) Transfers: Sit to/from Stand Sit to Stand: Supervision           General transfer comment: Minimal cues for locking brakes.    Ambulation/Gait Ambulation/Gait assistance: Supervision Gait Distance (Feet): 400 Feet Assistive device: Rollator (4 wheels) Gait Pattern/deviations: Step-through pattern, Decreased stride length Gait velocity: decr      General Gait Details: minimal cues for locking brakes   Stairs Stairs: Yes Stairs assistance: Supervision Stair Management: One rail Left, Alternating pattern, Forwards Number of Stairs: 15 General stair comments: No LOB noted   Wheelchair Mobility    Modified Rankin (Stroke Patients Only)       Balance Overall balance assessment: Mild deficits observed, not formally tested                                          Cognition Arousal/Alertness: Awake/alert Behavior During Therapy: WFL for tasks assessed/performed Overall Cognitive Status: Within Functional Limits for tasks assessed                                          Exercises      General Comments General comments (skin integrity, edema, etc.): Pt stayed above 97% on 6L throughout session.      Pertinent Vitals/Pain Pain Assessment Pain Assessment: No/denies pain    Home Living                          Prior Function            PT Goals (current goals can now be found in the care plan section) Progress towards PT goals: Goals met/education completed, patient discharged from PT    Frequency    Min 1X/week  PT Plan Frequency needs to be updated (DCPT)    Co-evaluation              AM-PAC PT "6 Clicks" Mobility   Outcome Measure  Help needed turning from your back to your side while in a flat bed without using bedrails?: None Help needed moving from lying on your back to sitting on the side of a flat bed without using bedrails?: None Help needed moving to and from a bed to a chair (including a wheelchair)?: A Little Help needed standing up from a chair using your arms (e.g., wheelchair or bedside chair)?: A Little Help needed to walk in hospital room?: A Little Help needed climbing 3-5 steps with a railing? : A Little 6 Click Score: 20    End of Session Equipment Utilized During Treatment: Gait belt;Oxygen Activity Tolerance: Patient  tolerated treatment well Patient left: in bed;with call bell/phone within reach Nurse Communication: Mobility status PT Visit Diagnosis: Unsteadiness on feet (R26.81);Other abnormalities of gait and mobility (R26.89);Muscle weakness (generalized) (M62.81);History of falling (Z91.81)     Time: 0600-4599 PT Time Calculation (min) (ACUTE ONLY): 10 min  Charges:  $Gait Training: 8-22 mins                     Shela Nevin, PT, DPT Acute Rehab Services [phone]    [name] 05/07/2022, 3:38 PM

## 2022-05-07 NOTE — Progress Notes (Signed)
PT Cancellation Note  Patient Details Name: Kirk [name] MRN: [id number] DOB: 01-06-1961   Cancelled Treatment:    Reason Eval/Treat Not Completed: Patient at procedure or test/unavailable (Pt off the floor at HD. Will try again later if time permits.)   [name] 05/07/2022, 10:58 AM

## 2022-05-07 NOTE — Progress Notes (Signed)
Kirk [name] FYT:[id number] DOB: 07/13/1960 DOA: 04/28/2022 PCP: Kirk Bookman, Kirk [name]   Subj: Kirk [name] is a 63 y.o. 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  CT is negative for acute intracranial abnormality. Labs are notable for normal lactic acid, normal WBC, platelets 60,000, normal potassium, normal serum bicarbonate, and normal BUN. COVID, influenza, and RSV PCR are negative.  Respiratory panel shows rhinovirus positive.    Obj: 4/15 afebrile overnight, patient's BP has been increasing overnight no action taken.      Objective: VITAL SIGNS: Temp: 97.9 F (36.6 C) (04/15 0942) Temp Source: Oral (04/15 0942) BP: 186/71 (04/15 1230) Pulse Rate: 71 (04/15 1230)   VENTILATOR SETTINGS: Room air 4/15 SpO2 100%  No intake or output data in the 24 hours ending 05/07/22 1300   Physical Exam:  General: A/O x 4, no acute respiratory distress Eyes: negative scleral hemorrhage, negative anisocoria, negative icterus ENT: Negative Runny nose, negative gingival bleeding, Neck:  Negative scars, masses, torticollis, lymphadenopathy, JVD Lungs: positive diffuse rhonchi, negative wheezes or crackles Cardiovascular: Regular rate and rhythm without murmur gallop or rub normal S1 and S2 Abdomen: negative abdominal pain, nondistended, positive soft, bowel sounds, no rebound, no ascites, no appreciable mass Extremities: No significant cyanosis, clubbing, or edema bilateral lower extremities Skin: Negative rashes, lesions, ulcers Psychiatric:  Negative depression, negative anxiety, negative fatigue, negative mania  Central nervous system:  Cranial nerves II through XII intact, tongue/uvula midline, all extremities muscle strength 5/5, sensation intact throughout, negative dysarthria, negative expressive aphasia,  negative receptive aphasia.    Mobility Assessment (last 72 hours)     Mobility Assessment     Row Name 05/07/22 0800 05/06/22 0745 05/05/22 2041 05/04/22 1954 05/04/22 1502   Does patient have an order for bedrest or is patient medically unstable No - Continue assessment No - Continue assessment No - Continue assessment No - Continue assessment --   What is the highest level of mobility based on the progressive mobility assessment? Level 6 (Walks independently in room and hall) - Balance while walking in room without assist - Complete Level 6 (Walks independently in room and hall) - Balance while walking in room without assist - Complete Level 5 (Walks with assist in room/hall) - Balance while stepping forward/back and can walk in room with assist - Complete Level 5 (Walks with assist in room/hall) - Balance while stepping forward/back and can walk in room with assist - Complete Level 5 (Walks with assist in room/hall) - Balance while stepping forward/back and can walk in room with assist - Complete              DVT prophylaxis:  Code Status: Full Family Communication: 4/12 spoke with Kirk [name] wife  Status is: Inpatient    Dispo: The patient is from: Home              Anticipated d/c is to: Home              Anticipated d/c date is: 3 days              Patient currently is not medically stable to d/c.    Procedures/Significant Events: 4/9 CT chest W. Wo contrast -ucous plugging, mild consolidations and numerous small pulmonary nodules most pronounced in the lower lungs, slightly worsened when compared with the prior exam. Findings  are likely due to infection or aspiration. 2. Severe coronary artery calcifications.  Consultants:     Cultures 4/6 influenza A/B negative 4/6 RSV negative 4/6 SARS coronavirus negative 4/7 respiratory virus panel positive rhinovirus  Antimicrobials:    Assessment & Plan: Covid vaccination;   Principal Problem:   Hypertensive  crisis Active Problems:   Seizure   Thrombocytopenia   ESRD on dialysis   Acute respiratory failure with hypoxia   Secondary central sleep apnea  Acute respiratory failure with hypoxia -Continuous pulse ox - Titrate O2 to maintain SpO2> 93% -Obtain ambulatory SpO2 in a.m. ADDENDUM 4/11 hold given patient's continued high O2 demand. -4/12 see pneumonia  Positive Rhinovirus pneumonia -DuoNeb QID -Solu-Medrol 60 mg daily - Incentive spirometry - Flutter valve - Mucinex DM BID -4/11 hypertonic saline nebulizer BID.  Following each nebulizer treatment treat with DuoNeb -4/12 continue supportive care -4/14 patient understands that may need to go home on O2.  Will give him another 48 hours to determine if he can be weaned off O2 prior to discharge. -4/15 ambulatory SpO2 pending  OSA - CPAP per respiratory therapy  HTN crisis -Amlodipine 10 mg daily -Doxazosin 8 mg daily -Hydralazine 100 mg TID -Irbesartan 150 mg daily - 4/15 increase Imdur 120 mg daily -Metoprolol 100 mg BID -4/15 patient on HD today will determine if needs additional medication post HD.   ESRD on HD M/W/F -HD per nephrology -4/12 last HD session today  Chronic thrombocytopenia -Monitor closely, currently no sign of acute bleed -4/11 slight trend upward  Anemia of chronic disease -Anemia panel c/w anemia of chronic DZ -Transfuse for hemoglobin<7  Seizures/headache -Keppra 250 mg M/W/F with HD  HLD -Pravastatin 40 mg daily - Lipid panel pending  Obesity (BMI 30.7 kg/m) -Follow-up with PCP    Mobility Assessment (last 72 hours)     Mobility Assessment     Row Name 05/07/22 0800 05/06/22 0745 05/05/22 2041 05/04/22 1954 05/04/22 1502   Does patient have an order for bedrest or is patient medically unstable No - Continue assessment No - Continue assessment No - Continue assessment No - Continue assessment --   What is the highest level of mobility based on the progressive mobility assessment?  Level 6 (Walks independently in room and hall) - Balance while walking in room without assist - Complete Level 6 (Walks independently in room and hall) - Balance while walking in room without assist - Complete Level 5 (Walks with assist in room/hall) - Balance while stepping forward/back and can walk in room with assist - Complete Level 5 (Walks with assist in room/hall) - Balance while stepping forward/back and can walk in room with assist - Complete Level 5 (Walks with assist in room/hall) - Balance while stepping forward/back and can walk in room with assist - Complete                 Time: 35 minutes         Care during the described time interval was provided by me .  I have reviewed this patient's available data, including medical history, events of note, physical examination, and all test results as part of my evaluation.

## 2022-05-08 ENCOUNTER — Other Ambulatory Visit (HOSPITAL_COMMUNITY): Payer: Self-pay

## 2022-05-08 DIAGNOSIS — N186 End stage renal disease: Secondary | ICD-10-CM | POA: Diagnosis not present

## 2022-05-08 DIAGNOSIS — J206 Acute bronchitis due to rhinovirus: Secondary | ICD-10-CM

## 2022-05-08 DIAGNOSIS — R569 Unspecified convulsions: Secondary | ICD-10-CM | POA: Diagnosis not present

## 2022-05-08 DIAGNOSIS — D696 Thrombocytopenia, unspecified: Secondary | ICD-10-CM | POA: Diagnosis not present

## 2022-05-08 DIAGNOSIS — I169 Hypertensive crisis, unspecified: Secondary | ICD-10-CM | POA: Diagnosis not present

## 2022-05-08 LAB — CBC WITH DIFFERENTIAL/PLATELET
Abs Immature Granulocytes: 0.03 10*3/uL (ref 0.00–0.07)
Basophils Absolute: 0 10*3/uL (ref 0.0–0.1)
Basophils Relative: 0 %
Eosinophils Absolute: 0 10*3/uL (ref 0.0–0.5)
Eosinophils Relative: 0 %
HCT: 37.3 % — ABNORMAL LOW (ref 39.0–52.0)
Hemoglobin: 12.1 g/dL — ABNORMAL LOW (ref 13.0–17.0)
Immature Granulocytes: 1 %
Lymphocytes Relative: 6 %
Lymphs Abs: 0.3 10*3/uL — ABNORMAL LOW (ref 0.7–4.0)
MCH: 30.8 pg (ref 26.0–34.0)
MCHC: 32.4 g/dL (ref 30.0–36.0)
MCV: 94.9 fL (ref 80.0–100.0)
Monocytes Absolute: 0.1 10*3/uL (ref 0.1–1.0)
Monocytes Relative: 2 %
Neutro Abs: 4.5 10*3/uL (ref 1.7–7.7)
Neutrophils Relative %: 91 %
Platelets: 94 10*3/uL — ABNORMAL LOW (ref 150–400)
RBC: 3.93 MIL/uL — ABNORMAL LOW (ref 4.22–5.81)
RDW: 16.5 % — ABNORMAL HIGH (ref 11.5–15.5)
WBC: 4.9 10*3/uL (ref 4.0–10.5)
nRBC: 0 % (ref 0.0–0.2)

## 2022-05-08 LAB — COMPREHENSIVE METABOLIC PANEL
ALT: 18 U/L (ref 0–44)
AST: 17 U/L (ref 15–41)
Albumin: 3.2 g/dL — ABNORMAL LOW (ref 3.5–5.0)
Alkaline Phosphatase: 53 U/L (ref 38–126)
Anion gap: 12 (ref 5–15)
BUN: 66 mg/dL — ABNORMAL HIGH (ref 6–20)
CO2: 26 mmol/L (ref 22–32)
Calcium: 9.1 mg/dL (ref 8.9–10.3)
Chloride: 97 mmol/L — ABNORMAL LOW (ref 98–111)
Creatinine, Ser: 9.3 mg/dL — ABNORMAL HIGH (ref 0.61–1.24)
GFR, Estimated: 6 mL/min — ABNORMAL LOW (ref >60)
Glucose, Bld: 215 mg/dL — ABNORMAL HIGH (ref 70–99)
Potassium: 5.8 mmol/L — ABNORMAL HIGH (ref 3.5–5.1)
Sodium: 135 mmol/L (ref 135–145)
Total Bilirubin: 1.1 mg/dL (ref 0.3–1.2)
Total Protein: 5.8 g/dL — ABNORMAL LOW (ref 6.5–8.1)

## 2022-05-08 LAB — PHOSPHORUS: Phosphorus: 4.8 mg/dL — ABNORMAL HIGH (ref 2.5–4.6)

## 2022-05-08 LAB — MAGNESIUM: Magnesium: 1.9 mg/dL (ref 1.7–2.4)

## 2022-05-08 MED ORDER — BENZONATATE 200 MG PO CAPS
200.0000 mg | ORAL_CAPSULE | Freq: Three times a day (TID) | ORAL | 0 refills | Status: DC | PRN
  Filled 2022-05-08: qty 20, 7d supply, fill #0

## 2022-05-08 MED ORDER — HEPARIN SODIUM (PORCINE) 1000 UNIT/ML DIALYSIS
2500.0000 [IU] | Freq: Once | INTRAMUSCULAR | Status: AC
  Administered 2022-05-08: 2500 [IU] via INTRAVENOUS_CENTRAL

## 2022-05-08 MED ORDER — AZITHROMYCIN 250 MG PO TABS
ORAL_TABLET | ORAL | 0 refills | Status: DC
  Filled 2022-05-08: qty 2, 1d supply, fill #0

## 2022-05-08 MED ORDER — CEFDINIR 300 MG PO CAPS
300.0000 mg | ORAL_CAPSULE | Freq: Two times a day (BID) | ORAL | 0 refills | Status: DC
  Filled 2022-05-08: qty 4, 2d supply, fill #0

## 2022-05-08 MED ORDER — ISOSORBIDE MONONITRATE ER 120 MG PO TB24
120.0000 mg | ORAL_TABLET | Freq: Every day | ORAL | 0 refills | Status: DC
  Filled 2022-05-08: qty 30, 30d supply, fill #0

## 2022-05-08 MED ORDER — CHLORHEXIDINE GLUCONATE CLOTH 2 % EX PADS: 6.0000 | MEDICATED_PAD | Freq: Every day | CUTANEOUS | Status: DC

## 2022-05-08 MED ORDER — DM-GUAIFENESIN ER 30-600 MG PO TB12
1.0000 | ORAL_TABLET | Freq: Two times a day (BID) | ORAL | 0 refills | Status: DC
  Filled 2022-05-08: qty 14, 7d supply, fill #0

## 2022-05-08 MED ORDER — PREDNISONE 20 MG PO TABS
40.0000 mg | ORAL_TABLET | Freq: Every day | ORAL | 0 refills | Status: DC
  Filled 2022-05-08: qty 4, 2d supply, fill #0

## 2022-05-08 NOTE — Progress Notes (Signed)
Per chart review, appears pt for possible d/c later today. Contacted [facility] to advise clinic that pt may d/c later today and to possibly resume care tomorrow. Made clinic aware of this info so pt can be placed on schedule for tomorrow.   Olivia Canter Renal Navigator [phone]

## 2022-05-08 NOTE — Progress Notes (Signed)
Mobility Specialist Progress Note:   05/08/22 1125  Mobility  Activity Ambulated with assistance in hallway  Level of Assistance Standby assist, set-up cues, supervision of patient - no hands on  Assistive Device Four wheel walker  Distance Ambulated (ft) 300 ft  Activity Response Tolerated well  Mobility Referral Yes  $Mobility charge 1 Mobility   Pt eager for mobility session. Required no physical assistance throughout. X1 standing rest break taken d/t fatigue. Pt able to maintain SpO2 >90% on RA throughout session. Left sitting EOB with all needs met.   Addison Lank Mobility Specialist Please contact via SecureChat or  Rehab office at [phone]

## 2022-05-08 NOTE — Care Management Important Message (Signed)
Important Message  Patient Details  Name: [name] MRN: [id number] Date of Birth: 03/08/1960   Medicare Important Message Given:  Yes     Renie Ora 05/08/2022, 1:02 PM

## 2022-05-08 NOTE — Progress Notes (Signed)
POST HD TX NOTE  05/08/22 1833  Vitals  Temp (!) 97.5 F (36.4 C)  Temp Source Oral  BP 138/62  MAP (mmHg) 84  BP Location Right Arm  BP Method Automatic  Patient Position (if appropriate) Lying  Pulse Rate (!) 117  Pulse Rate Source Monitor  ECG Heart Rate 71  Resp (!) 22  Oxygen Therapy  SpO2 92 %  O2 Device Room Air  Pulse Oximetry Type Continuous  During Treatment Monitoring  Intra-Hemodialysis Comments (S)   (post HD tx VS check)  Post Treatment  Dialyzer Clearance Heavily streaked  Duration of HD Treatment -hour(s) 3.5 hour(s)  Hemodialysis Intake (mL) 0 mL  Liters Processed 77.3  Fluid Removed (mL) 4000 mL  Tolerated HD Treatment Yes  Post-Hemodialysis Comments (S)  tx completed w/o problem, UF goal met, blood rinsed back, VSS. Medication Admin: Heparin2500 unit bolus  Fistula / Graft Left Forearm Arteriovenous fistula  No placement date or time found.   Placed prior to admission: Yes  Orientation: Left  Access Location: Forearm  Access Type: Arteriovenous fistula  Site Condition No complications  Fistula / Graft Assessment Bruit;Thrill;Present  Drainage Description None

## 2022-05-08 NOTE — Progress Notes (Signed)
Nurse requested Mobility Specialist to perform oxygen saturation test with pt which includes removing pt from oxygen both at rest and while ambulating.  Below are the results from that testing.     Patient Saturations on Room Air at Rest = spO2 100%  Patient Saturations on Room Air while Ambulating = sp02 91% .  Patient Saturations on N/A Liters of oxygen while Ambulating = sp02 N/A%  At end of testing pt left in room on N/A  Liters of oxygen.  Reported results to nurse.   Addison Lank Mobility Specialist Please contact via SecureChat or  Rehab office at [phone]

## 2022-05-08 NOTE — Discharge Summary (Signed)
Physician Discharge Summary  [name]:[id number] DOB: 25-Aug-1960 DOA: 04/28/2022  PCP: Caesar Bookman, NP  Admit date: 04/28/2022 Discharge date: 05/09/2022  Time spent: 30 minutes  Recommendations for Outpatient Follow-up:   Acute respiratory failure with hypoxia -Continuous pulse ox - Titrate O2 to maintain SpO2> 93% -Obtain ambulatory SpO2 in a.m. ADDENDUM 4/11 hold given patient's continued high O2 demand. -4/12 see pneumonia Patient Saturations on Room Air at Rest = spO2 100% Patient Saturations on Room Air while Ambulating = sp02 91% . Patient Saturations on N/A Liters of oxygen while Ambulating = sp02 N/A% At end of testing pt left in room on N/A  Liters of oxygen. -Does not meet criteria for home O2   Positive Rhinovirus pneumonia -DuoNeb QID -Solu-Medrol 60 mg daily - Incentive spirometry - Flutter valve - Mucinex DM BID -4/11 hypertonic saline nebulizer BID.  Following each nebulizer treatment treat with DuoNeb -4/12 continue supportive care -4/14 patient understands that may need to go home on O2.  Will give him another 48 hours to determine if he can be weaned off O2 prior to discharge. -4/15 ambulatory SpO2 pending   OSA - CPAP per respiratory therapy   HTN crisis -Amlodipine 10 mg daily -Doxazosin 8 mg daily -Hydralazine 100 mg TID -Irbesartan 150 mg daily - 4/15 increase Imdur 120 mg daily -Metoprolol 100 mg BID -4/15 patient on HD today will determine if needs additional medication post HD.     ESRD on HD M/W/F -HD per nephrology -4/12 last HD session today   Chronic thrombocytopenia -Monitor closely, currently no sign of acute bleed -4/11 slight trend upward   Anemia of chronic disease -Anemia panel c/w anemia of chronic DZ -Transfuse for hemoglobin<7   Seizures/headache -Keppra 250 mg M/W/F with HD   HLD -Pravastatin 40 mg daily - Lipid panel pending   Obesity (BMI 30.7 kg/m) -Follow-up with PCP     Discharge Diagnoses:   Principal Problem:   Hypertensive crisis Active Problems:   Seizure   Thrombocytopenia   ESRD on dialysis   Acute respiratory failure with hypoxia   Secondary central sleep apnea   Discharge Condition: Stable stable  Diet recommendation: Renal  Filed Weights   05/06/22 0449 05/07/22 0612 05/08/22 0331  Weight: 85.6 kg 87.1 kg 88.4 kg    History of present illness:  Kirk [name] is a 62 y.o. 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  CT is negative for acute intracranial abnormality. Labs are notable for normal lactic acid, normal WBC, platelets 60,000, normal potassium, normal serum bicarbonate, and normal BUN. COVID, influenza, and RSV PCR are negative.  Respiratory panel shows rhinovirus positive.   Hospital Course:  See above  Procedures: 4/9 CT chest W. Wo contrast -ucous plugging, mild consolidations and numerous small pulmonary nodules most pronounced in the lower lungs, slightly worsened when compared with the prior exam. Findings are likely due to infection or aspiration. 2. Severe coronary artery calcifications.  Consultations: Nephrology  Cultures  4/6 influenza A/B negative 4/6 RSV negative 4/6 SARS coronavirus negative 4/7 respiratory virus panel positive rhinovirus  Antibiotics Anti-infectives (From admission, onward)    Start     Ordered Stop   05/09/22 0000  azithromycin (ZITHROMAX) 250 MG tablet        05/08/22 1323     05/08/22 0000  cefdinir (  OMNICEF) 300 MG capsule        05/08/22 1323     05/05/22 1345  cefTRIAXone (ROCEPHIN) 2 g in sodium chloride 0.9 % 100 mL IVPB        05/05/22 1246     05/05/22 1345  azithromycin (ZITHROMAX) tablet 500 mg        05/05/22 1246 05/10/22 0959   05/01/22 1745  azithromycin (ZITHROMAX) 500 mg in sodium chloride 0.9 % 250 mL IVPB   Status:  Discontinued        05/01/22 1657 05/03/22 1520   05/01/22 1657  cefTRIAXone (ROCEPHIN) 1 g in sodium chloride 0.9 % 100 mL IVPB  Status:  Discontinued        05/01/22 1657 05/03/22 1520         Discharge Exam: Vitals:   05/08/22 0751 05/08/22 0855 05/08/22 0927 05/08/22 1209  BP: (!) 155/63  (!) 156/63 (!) 151/68  Pulse: 64 66 68 62  Resp: 18 16  17   Temp: 97.8 F (36.6 C)   98.3 F (36.8 C)  TempSrc: Oral   Oral  SpO2: 100% 100%    Weight:      Height:       General: A/O x 4, no acute respiratory distress Eyes: negative scleral hemorrhage, negative anisocoria, negative icterus ENT: Negative Runny nose, negative gingival bleeding, Neck:  Negative scars, masses, torticollis, lymphadenopathy, JVD Lungs: positive diffuse rhonchi, negative wheezes or crackles Cardiovascular: Regular rate and rhythm without murmur gallop or rub normal S1 and S2  Discharge Instructions   Allergies as of 05/08/2022       Reactions   Codeine Nausea And Vomiting   Hydrocodone-acetaminophen Nausea And Vomiting        Medication List     STOP taking these medications    cloNIDine 0.2 MG tablet Commonly known as: CATAPRES   guaiFENesin 600 MG 12 hr tablet Commonly known as: MUCINEX       TAKE these medications    acetaminophen 500 MG tablet Commonly known as: TYLENOL Take 1 tablet (500 mg total) by mouth every 6 (six) hours as needed for moderate pain.   amLODipine 10 MG tablet Commonly known as: NORVASC Take 1 tablet (10 mg total) by mouth every evening.   aspirin 81 MG chewable tablet Chew 81 mg by mouth daily.   azithromycin 250 MG tablet Commonly known as: ZITHROMAX 1 tablet p.o. daily Start taking on: May 09, 2022   benzonatate 200 MG capsule Commonly known as: TESSALON Take 1 capsule (200 mg total) by mouth 3 (three) times daily as needed for cough.   budesonide-formoterol 160-4.5 MCG/ACT inhaler Commonly known as: SYMBICORT Inhale 2 puffs into the  lungs as needed.   calcitRIOL 0.5 MCG capsule Commonly known as: ROCALTROL Take 2 capsules (1 mcg total) by mouth every Monday, Wednesday, and Friday with hemodialysis.   cefdinir 300 MG capsule Commonly known as: OMNICEF Take 1 capsule (300 mg total) by mouth 2 (two) times daily.   cinacalcet 30 MG tablet Commonly known as: SENSIPAR Take 2 tablets (60 mg total) by mouth 3 (three) times a week.   dextromethorphan-guaiFENesin 30-600 MG 12hr tablet Commonly known as: MUCINEX DM Take 1 tablet by mouth 2 (two) times daily.   diphenhydrAMINE 25 mg capsule Commonly known as: BENADRYL Take 25 mg by mouth every 6 (six) hours as needed for allergies.   doxazosin 8 MG tablet Commonly known as: CARDURA Take 1 tablet (8 mg total) by mouth daily.  DULoxetine 30 MG capsule Commonly known as: CYMBALTA TAKE 1 CAPSULE BY MOUTH EVERY DAY   Fosrenol 1000 MG Pack Generic drug: Lanthanum Carbonate Take 1 packet by mouth 3 (three) times daily.   hydrALAZINE 100 MG tablet Commonly known as: APRESOLINE Take 1 tablet (100 mg total) by mouth 3 (three) times daily.   isosorbide mononitrate 120 MG 24 hr tablet Commonly known as: IMDUR Take 1 tablet (120 mg total) by mouth daily. Start taking on: May 09, 2022 What changed:  medication strength how much to take   levETIRAcetam 250 MG tablet Commonly known as: KEPPRA TAKE 1 TABLET (250 MG TOTAL) BY MOUTH EVERY MONDAY, WEDNESDAY, AND FRIDAY WITH HEMODIALYSIS. What changed: Another medication with the same name was removed. Continue taking this medication, and follow the directions you see here.   methocarbamol 750 MG tablet Commonly known as: ROBAXIN Take 1 tablet (750 mg total) by mouth 4 (four) times daily.   metoprolol tartrate 100 MG tablet Commonly known as: LOPRESSOR Take 1 tablet (100 mg total) by mouth 2 (two) times daily.   multivitamin Tabs tablet Take 1 tablet by mouth daily.   ondansetron 4 MG disintegrating  tablet Commonly known as: ZOFRAN-ODT TAKE 1 TABLET BY MOUTH EVERY 8 HOURS AS NEEDED FOR NAUSEA AND VOMITING   oxyCODONE 5 MG immediate release tablet Commonly known as: Oxy IR/ROXICODONE Take 1-2 tablets (5-10 mg total) by mouth every 4 (four) hours as needed for moderate pain.   pantoprazole 40 MG tablet Commonly known as: PROTONIX Take 1 tablet (40 mg total) by mouth 2 (two) times daily before a meal.   pravastatin 40 MG tablet Commonly known as: PRAVACHOL TAKE 1 TABLET BY MOUTH EVERY DAY IN THE EVENING   predniSONE 20 MG tablet Commonly known as: DELTASONE Take 2 tablets (40 mg total) by mouth daily with breakfast. Start taking on: May 09, 2022   sevelamer carbonate 800 MG tablet Commonly known as: Renvela Take 2 tablets (1,600 mg total) by mouth as needed (snacks).   sevelamer carbonate 800 MG tablet Commonly known as: Renvela Take 4 tablets (3,200 mg total) by mouth 3 (three) times daily with meals.   sucralfate 1 GM/10ML suspension Commonly known as: CARAFATE Take 10 mLs (1 g total) by mouth 4 (four) times daily -  with meals and at bedtime.   traZODone 50 MG tablet Commonly known as: DESYREL Take 1 tablet (50 mg total) by mouth at bedtime.   valsartan 160 MG tablet Commonly known as: Diovan Take 1 tablet (160 mg total) by mouth daily.   Vitamin D3 25 MCG (1000 UT) Caps Take 4,000 Units by mouth daily.               Durable Medical Equipment  (From admission, onward)           Start     Ordered   05/02/22 1449  For home use only DME 4 wheeled rolling walker with seat  Once       Question:  Patient needs a walker to treat with the following condition  Answer:  General weakness   05/02/22 1449           Allergies  Allergen Reactions   Codeine Nausea And Vomiting   Hydrocodone-Acetaminophen Nausea And Vomiting    Follow-up Information     [facility] Follow up.   Why: Rollator to be delivered. Contact information: [phone] [place] [address] [phone]  The results of significant diagnostics from this hospitalization (including imaging, microbiology, ancillary and laboratory) are listed below for reference.    Significant Diagnostic Studies: DG CHEST PORT 1 VIEW  Result Date: 05/05/2022 CLINICAL DATA:  200808 Hypoxia [id number] EXAM: PORTABLE CHEST - 1 VIEW COMPARISON:  04/28/2022 FINDINGS: The heart size and mediastinal contours are within normal limits. There is diffuse pulmonary interstitial prominence which could be seen with atypical infection, edema or interstitial lung disease. There is no focal consolidation. No pneumothorax or pleural effusion. Aorta is calcified. There are thoracic degenerative changes. IMPRESSION: Nonspecific interstitial prominence consistent with atypical infection, edema or interstitial lung disease. No focal consolidation. Electronically Signed   By: Layla Maw M.D.   On: 05/05/2022 09:34   CT CHEST WO CONTRAST  Result Date: 05/01/2022 CLINICAL DATA:  Respiratory illness EXAM: CT CHEST WITHOUT CONTRAST TECHNIQUE: Multidetector CT imaging of the chest was performed following the standard protocol without IV contrast. RADIATION DOSE REDUCTION: This exam was performed according to the departmental dose-optimization program which includes automated exposure control, adjustment of the mA and/or kV according to patient size and/or use of iterative reconstruction technique. COMPARISON:  Chest CT dated August 01, 2021 FINDINGS: Cardiovascular: Cardiomegaly. Trace pericardial effusions. Normal caliber thoracic aorta with moderate atherosclerotic disease. Severe coronary artery calcifications. Mediastinum/Nodes: Esophagus and thyroid unremarkable. No pathologically enlarged nodes seen in the chest. Lungs/Pleura: Central airways are patent. Mucous plugging, mild consolidations and numerous small pulmonary nodules most pronounced in the lower lungs, slightly  worsened when compared with the prior exam. Associated lower lobe dendriform pulmonary ossification, similar to prior. No pleural effusion or pneumothorax. Upper Abdomen: Cholecystectomy clips. 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 No aggressive appearing osseous lesions. IMPRESSION: 1. Mucous plugging, mild consolidations and numerous small pulmonary nodules most pronounced in the lower lungs, slightly worsened when compared with the prior exam. Findings are likely due to infection or aspiration. 2. Severe coronary artery calcifications. 3. Aortic Atherosclerosis (ICD10-I70.0). Electronically Signed   By: Allegra Lai M.D.   On: 05/01/2022 13:02   EEG adult  Result Date: 04/30/2022 Charlsie Quest, MD     04/30/2022  4:00 PM Patient Name: MATTEUS MCNELLY MRN: [id number] Epilepsy Attending: Charlsie Quest Referring Physician/Provider: Kathlen Mody, MD Date: 04/30/2022 Duration: 22.36 mins Patient history: 62yo M getting EEG to evaluate for seizure. Level of alertness: Awake, asleep AEDs during EEG study: LEV Technical aspects: This EEG study was done with scalp electrodes positioned according to the 10-20 International system of electrode placement. Electrical activity was reviewed with band pass filter of 1-70Hz , sensitivity of 7 uV/mm, display speed of 40mm/sec with a  notched filter applied as appropriate. EEG data were recorded continuously and digitally stored.  Video monitoring was available and reviewed as appropriate. Description: The posterior dominant rhythm consists of 9-10 Hz activity of moderate voltage (25-35 uV) seen predominantly in posterior head regions, symmetric and reactive to eye opening and eye closing. Sleep was characterized by vertex waves, sleep spindles (12 to 14 Hz), maximal frontocentral region.  Hyperventilation and photic stimulation were not performed.   IMPRESSION: This study is within normal limits. No seizures or epileptiform  discharges were seen throughout the recording. A normal interictal EEG does not exclude the diagnosis of epilepsy. Charlsie Quest   CT Head Wo Contrast  Result Date: 04/28/2022 CLINICAL DATA:  New onset headache. EXAM: CT HEAD WITHOUT CONTRAST TECHNIQUE: Contiguous axial images were obtained from the base of the skull through the vertex  without intravenous contrast. RADIATION DOSE REDUCTION: This exam was performed according to the departmental dose-optimization program which includes automated exposure control, adjustment of the mA and/or kV according to patient size and/or use of iterative reconstruction technique. COMPARISON:  CT dated 02/05/2022 FINDINGS: Brain: There is mild right frontoparietal convexity dural thickening in the location of a previous subdural hematoma. This was seen on the last CT and again is no more than 2-3 mm in thickness, alternatively could be a small thin chronic subdural hematoma. No recent parenchymal or extra-axial bleed preserved ventral and no cortical based infarct is seen. There is mild atrophy and small-vessel disease. The ventricles are normal in size and position. Basal cisterns are clear. Vascular: Calcific plaques both siphons both distal vertebral arteries. No hyperdense central vessel. Skull: Negative for fractures or focal lesions. Sinuses/Orbits: No acute findings. There is chronic fluid opacification of the lower right mastoid air cells. Remaining bilateral mastoids are clear. Trace chronic membrane disease maxillary sinuses. Other: None. IMPRESSION: 1. No acute intracranial CT findings or interval changes. 2. Mild right frontoparietal convexity dural thickening in the location of a previous subdural hematoma. This is no more than 2-3 mm in thickness without mass effect, alternatively could be a small chronic subdural hematoma. 3. Atrophy and small-vessel disease. 4. Chronic fluid opacification of the lower right mastoid air cells. Electronically Signed   By: Almira Bar M.D.   On: 04/28/2022 22:53   DG Chest 2 View  Result Date: 04/28/2022 CLINICAL DATA:  Possible sepsis EXAM: CHEST - 2 VIEW COMPARISON:  11/15/2021 FINDINGS: Cardiac shadow is stable. Aortic calcifications are noted. The lungs are well aerated bilaterally. Mild interstitial prominence is again seen in the bases stable from the prior study. No bony abnormality is seen. IMPRESSION: Chronic interstitial prominence in the bases. No acute abnormality is noted. Electronically Signed   By: Alcide Clever M.D.   On: 04/28/2022 21:24    Microbiology: Recent Results (from the past 240 hour(s))  Resp panel by RT-PCR (RSV, Flu A&B, Covid) Anterior Nasal Swab     Status: None   Collection Time: 04/28/22  7:42 PM   Specimen: Anterior Nasal Swab  Result Value Ref Range Status   SARS Coronavirus 2 by RT PCR NEGATIVE NEGATIVE Final   Influenza A by PCR NEGATIVE NEGATIVE Final   Influenza B by PCR NEGATIVE NEGATIVE Final    Comment: (NOTE) The Xpert Xpress SARS-CoV-2/FLU/RSV plus assay is intended as an aid in the diagnosis of influenza from Nasopharyngeal swab specimens and should not be used as a sole basis for treatment. Nasal washings and aspirates are unacceptable for Xpert Xpress SARS-CoV-2/FLU/RSV testing.  Fact Sheet for Patients: BloggerCourse.com  Fact Sheet for Healthcare Providers: SeriousBroker.it  This test is not yet approved or cleared by the [place] FDA and has been authorized for detection and/or diagnosis of SARS-CoV-2 by FDA under an Emergency Use Authorization (EUA). This EUA will remain in effect (meaning this test can be used) for the duration of the COVID-19 declaration under Section 564(b)(1) of the Act, 21 U.S.C. section 360bbb-3(b)(1), unless the authorization is terminated or revoked.     Resp Syncytial Virus by PCR NEGATIVE NEGATIVE Final    Comment: (NOTE) Fact Sheet for  Patients: BloggerCourse.com  Fact Sheet for Healthcare Providers: SeriousBroker.it  This test is not yet approved or cleared by the [place] FDA and has been authorized for detection and/or diagnosis of SARS-CoV-2 by FDA under an Emergency Use Authorization (EUA). This EUA will remain  in effect (meaning this test can be used) for the duration of the COVID-19 declaration under Section 564(b)(1) of the Act, 21 U.S.C. section 360bbb-3(b)(1), unless the authorization is terminated or revoked.  Performed at [facility], [address], [place], [place] [address]   Culture, blood (Routine x 2)     Status: None   Collection Time: 04/28/22  7:55 PM   Specimen: BLOOD RIGHT FOREARM  Result Value Ref Range Status   Specimen Description BLOOD RIGHT FOREARM  Final   Special Requests   Final    BOTTLES DRAWN AEROBIC AND ANAEROBIC Blood Culture adequate volume   Culture   Final    NO GROWTH 5 DAYS Performed at [facility], [address], [place], [place] [address]    Report Status 05/03/2022 FINAL  Final  Culture, blood (Routine x 2)     Status: None   Collection Time: 04/28/22  8:14 PM   Specimen: BLOOD  Result Value Ref Range Status   Specimen Description BLOOD RIGHT ANTECUBITAL  Final   Special Requests   Final    BOTTLES DRAWN AEROBIC AND ANAEROBIC Blood Culture adequate volume   Culture   Final    NO GROWTH 5 DAYS Performed at [facility], [address] [place], [place] [address]    Report Status 05/03/2022 FINAL  Final  Respiratory (~20 pathogens) panel by PCR     Status: Abnormal   Collection Time: 04/29/22  4:15 AM   Specimen: Nasopharyngeal Swab; Respiratory  Result Value Ref Range Status   Adenovirus NOT DETECTED NOT DETECTED Final   Coronavirus 229E NOT DETECTED NOT DETECTED Final    Comment: (NOTE) The Coronavirus on the Respiratory Panel, DOES NOT test for the novel  Coronavirus (2019 nCoV)     Coronavirus HKU1 NOT DETECTED NOT DETECTED Final   Coronavirus NL63 NOT DETECTED NOT DETECTED Final   Coronavirus OC43 NOT DETECTED NOT DETECTED Final   Metapneumovirus NOT DETECTED NOT DETECTED Final   Rhinovirus / Enterovirus DETECTED (A) NOT DETECTED Final   Influenza A NOT DETECTED NOT DETECTED Final   Influenza B NOT DETECTED NOT DETECTED Final   Parainfluenza Virus 1 NOT DETECTED NOT DETECTED Final   Parainfluenza Virus 2 NOT DETECTED NOT DETECTED Final   Parainfluenza Virus 3 NOT DETECTED NOT DETECTED Final   Parainfluenza Virus 4 NOT DETECTED NOT DETECTED Final   Respiratory Syncytial Virus NOT DETECTED NOT DETECTED Final   Bordetella pertussis NOT DETECTED NOT DETECTED Final   Bordetella Parapertussis NOT DETECTED NOT DETECTED Final   Chlamydophila pneumoniae NOT DETECTED NOT DETECTED Final   Mycoplasma pneumoniae NOT DETECTED NOT DETECTED Final    Comment: Performed at [facility], [address] [place], [place] [address]  Expectorated Sputum Assessment w Gram Stain, Rflx to Resp Cult     Status: None   Collection Time: 05/06/22  9:58 PM   Specimen: Sputum  Result Value Ref Range Status   Specimen Description SPUTUM  Final   Special Requests NONE  Final   Sputum evaluation   Final    Sputum specimen not acceptable for testing.  Please recollect.   RESULT CALLED TO, READ BACK BY AND VERIFIED WITH: C HALL,RN@[id number]  05/06/22 MK Performed at [facility], [address], [place], [place] [address]    Report Status 05/06/2022 FINAL  Final     Labs: Basic Metabolic Panel: Recent Labs  Lab 05/04/22 0428 05/05/22 0150 05/06/22 0300 05/07/22 0327 05/08/22  0227  NA 132* 133* 133* 135 135  K 4.1 4.9 5.1 5.5* 5.8*  CL 96* 96* 97* 96* 97*  CO2 GLUCOSE 123* 110* 117* 102* 215*  BUN 53* 41* 66* 98* 66*  CREATININE 9.76* 7.77* 10.44* 12.34* 9.30*  CALCIUM 9.1 9.1 9.4 9.7 9.1  MG 2.1 2.0 2.0 2.2 1.9  PHOS 4.4 3.5 4.4 4.6 4.8*   Liver  Function Tests: Recent Labs  Lab 05/04/22 0428 05/05/22 0150 05/06/22 0300 05/07/22 0327 05/08/22 0227  AST 11* 16 15 11* 17  ALT ALKPHOS 46 50 48 51 53  BILITOT 0.9 1.1 1.4* 1.4* 1.1  PROT 5.6* 6.0* 5.6* 5.9* 5.8*  ALBUMIN 3.0* 3.3* 3.2* 3.4* 3.2*   No results for input(s): "LIPASE", "AMYLASE" in the last 168 hours. No results for input(s): "AMMONIA" in the last 168 hours. CBC: Recent Labs  Lab 05/04/22 0428 05/05/22 0150 05/06/22 0300 05/07/22 0327 05/08/22 0227  WBC 5.1 4.8 6.0 6.3 4.9  NEUTROABS 3.6 3.9 4.6 5.0 4.5  HGB 10.9* 11.3* 11.1* 12.4* 12.1*  HCT 32.7* 33.8* 34.5* 36.5* 37.3*  MCV 94.2 93.9 94.5 92.6 94.9  PLT 67* 72* 82* 94* 94*   Cardiac Enzymes: No results for input(s): "CKTOTAL", "CKMB", "CKMBINDEX", "TROPONINI" in the last 168 hours. BNP: BNP (last 3 results) No results for input(s): "BNP" in the last 8760 hours.  ProBNP (last 3 results) No results for input(s): "PROBNP" in the last 8760 hours.  CBG: No results for input(s): "GLUCAP" in the last 168 hours.     Signed:  Carolyne Littles, MD [facility]

## 2022-05-08 NOTE — Plan of Care (Signed)
  Problem: Education: Goal: Knowledge of General Education information will improve Description: Including pain rating scale, medication(s)/side effects and non-pharmacologic comfort measures Outcome: Progressing   Problem: Clinical Measurements: Goal: Respiratory complications will improve Outcome: Progressing   Problem: Safety: Goal: Ability to remain free from injury will improve Outcome: Progressing   

## 2022-05-08 NOTE — Progress Notes (Signed)
  [facility] Progress Note   Subjective:   Seen in room, on [place] O2. No c/o's. Thinks he needs more fluid off.   Objective Vitals:   05/08/22 1448 05/08/22 1500 05/08/22 1530 05/08/22 1600  BP:      Pulse: (!) 58 63 61 (!) 59  Resp: (!) 21 (!) 24 (!) 21 20  Temp:      TempSrc:      SpO2: 100% (!) 85% 100% 100%  Weight:      Height:       Physical Exam General: Alert male  in NAD Heart: RRR, no murmurs, rubs or gallops Lungs: CTA bilaterally without wheezing, rhonchi or rales Abdomen: Soft, non-distended, +BS  Extremities: No edema b/l lower extremities Dialysis Access: LUE AVF + bruit   OP HD: MWF [facility] 4h   86.4kg   2/2 bath    LUE AVF   Heparin 2500  - venofer 50 mg weekly - mircera 200 mcg IV q 2 wks, last 4/01, due 4/15 - rocaltrol 1.25 mcg po tiw  Assessment/Plan: Acute hypoxic resp failure/rhinovirus-  Still requiring O2, chest CT 4/9 showed mucus plugging - getting prednisone, bronchodilators, cough meds. CXR 05/05/22 atypical infection vs edema. Back on antibiotics. He is at dry wt but believes he has lost body wt and requests more fluid off. Will do extra HD today.  ESRD: Continue HD on MWF. HD again today off schedule.  HTN urgency: BP's remain a bit high on hydralazine, doxazosin, irbesartan and metoprolol. As above.  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 not due for ESA yet. Secondary HTPH: Phos controlled, continue lanthanum binder. Ca a bit elevated, not getting VDRA  Hx seizure disorder: Continue home meds.      Kirk Moselle, MD 05/08/2022, 4:11 PM  Recent Labs  Lab 05/07/22 0327 05/08/22 0227  HGB 12.4* 12.1*  ALBUMIN 3.4* 3.2*  CALCIUM 9.7 9.1  PHOS 4.6 4.8*  CREATININE 12.34* 9.30*  K 5.5* 5.8*     Inpatient medications:  amLODipine  10 mg Oral QPM   azithromycin  500 mg Oral Daily   Chlorhexidine Gluconate Cloth  6 each Topical Daily   Chlorhexidine Gluconate Cloth  6 each Topical Q0600   dextromethorphan-guaiFENesin  1 tablet Oral BID    doxazosin  8 mg Oral Daily   DULoxetine  30 mg Oral Daily   heparin  2,500 Units Dialysis Once in dialysis   heparin injection (subcutaneous)  5,000 Units Subcutaneous Q8H   hydrALAZINE  100 mg Oral Q8H   ipratropium-albuterol  3 mL Nebulization BID   irbesartan  150 mg Oral Daily   isosorbide mononitrate  120 mg Oral Daily   lanthanum  1,000 mg Oral TID WC   levETIRAcetam  250 mg Oral Q M,W,F-HD   metoprolol tartrate  100 mg Oral BID   pravastatin  40 mg Oral q1800   predniSONE  40 mg Oral Q breakfast   sodium chloride flush  3 mL Intravenous Q12H   sodium chloride flush  3 mL Intravenous Q12H   traZODone  50 mg Oral QHS    sodium chloride     cefTRIAXone (ROCEPHIN)  IV Stopped (05/08/22 1348)   sodium chloride, acetaminophen, benzonatate, fentaNYL (SUBLIMAZE) injection, hydrALAZINE, ondansetron **OR** ondansetron (ZOFRAN) IV, sodium chloride flush

## 2022-05-08 NOTE — Progress Notes (Signed)
Pt getting extra HD session today. HD called and report was given to [facility] Applewhite, RN. Told pt will go after lunch.

## 2022-05-08 NOTE — TOC Transition Note (Signed)
Transition of Care [facility]) - CM/SW Discharge Note   Patient Details  Name: Kirk [name] MRN: [id number] Date of Birth: 1960-07-18  [facility] CM/SW Contact:  Gala Lewandowsky, RN Phone Number: 05/08/2022, 1:33 PM   Clinical Narrative: Patient did not qualify for home oxygen today. Plan will be to return home post HD session. No further needs identified from Case Manager at this time.     Final next level of care: Home/Self Care Barriers to Discharge: No Barriers Identified  Discharge Plan and Services Additional resources added to the After Visit Summary for   In-house Referral: NA Discharge Planning Services: CM Consult Post Acute Care Choice: NA          DME Arranged: Walker rolling with seat DME Agency: [employer] Date DME Agency Contacted: 05/02/22 Time DME Agency Contacted: 1451 Representative spoke with at DME Agency: Belenda Cruise. HH Arranged: NA   Social Determinants of Health (SDOH) Interventions SDOH Screenings   Food Insecurity: No Food Insecurity (04/29/2022)  Housing: Low Risk  (04/29/2022)  Transportation Needs: No Transportation Needs (04/29/2022)  Utilities: Not At Risk (04/29/2022)  Alcohol Screen: Low Risk  (02/03/2018)  Depression (PHQ2-9): Low Risk  (02/28/2022)  Financial Resource Strain: Medium Risk (11/02/2021)  Physical Activity: Insufficiently Active (06/25/2017)  Social Connections: Moderately Integrated (06/25/2017)  Stress: No Stress Concern Present (06/25/2017)  Tobacco Use: Medium Risk (04/28/2022)   Readmission Risk Interventions     No data to display

## 2022-05-09 ENCOUNTER — Other Ambulatory Visit (HOSPITAL_COMMUNITY): Payer: Self-pay

## 2022-05-09 LAB — CBC WITH DIFFERENTIAL/PLATELET
Abs Immature Granulocytes: 0.03 10*3/uL (ref 0.00–0.07)
Basophils Absolute: 0 10*3/uL (ref 0.0–0.1)
Basophils Relative: 0 %
Eosinophils Absolute: 0 10*3/uL (ref 0.0–0.5)
Eosinophils Relative: 0 %
HCT: 36.4 % — ABNORMAL LOW (ref 39.0–52.0)
Hemoglobin: 12.3 g/dL — ABNORMAL LOW (ref 13.0–17.0)
Immature Granulocytes: 0 %
Lymphocytes Relative: 11 %
Lymphs Abs: 0.9 10*3/uL (ref 0.7–4.0)
MCH: 31.5 pg (ref 26.0–34.0)
MCHC: 33.8 g/dL (ref 30.0–36.0)
MCV: 93.3 fL (ref 80.0–100.0)
Monocytes Absolute: 0.7 10*3/uL (ref 0.1–1.0)
Monocytes Relative: 9 %
Neutro Abs: 6.4 10*3/uL (ref 1.7–7.7)
Neutrophils Relative %: 80 %
Platelets: 103 10*3/uL — ABNORMAL LOW (ref 150–400)
RBC: 3.9 MIL/uL — ABNORMAL LOW (ref 4.22–5.81)
RDW: 16.3 % — ABNORMAL HIGH (ref 11.5–15.5)
WBC: 8 10*3/uL (ref 4.0–10.5)
nRBC: 0 % (ref 0.0–0.2)

## 2022-05-09 LAB — MAGNESIUM: Magnesium: 1.9 mg/dL (ref 1.7–2.4)

## 2022-05-09 LAB — COMPREHENSIVE METABOLIC PANEL
ALT: 17 U/L (ref 0–44)
AST: 12 U/L — ABNORMAL LOW (ref 15–41)
Albumin: 3.3 g/dL — ABNORMAL LOW (ref 3.5–5.0)
Alkaline Phosphatase: 50 U/L (ref 38–126)
Anion gap: 13 (ref 5–15)
BUN: 56 mg/dL — ABNORMAL HIGH (ref 6–20)
CO2: 27 mmol/L (ref 22–32)
Calcium: 9.3 mg/dL (ref 8.9–10.3)
Chloride: 97 mmol/L — ABNORMAL LOW (ref 98–111)
Creatinine, Ser: 7.52 mg/dL — ABNORMAL HIGH (ref 0.61–1.24)
GFR, Estimated: 8 mL/min — ABNORMAL LOW (ref >60)
Glucose, Bld: 99 mg/dL (ref 70–99)
Potassium: 4.4 mmol/L (ref 3.5–5.1)
Sodium: 137 mmol/L (ref 135–145)
Total Bilirubin: 0.7 mg/dL (ref 0.3–1.2)
Total Protein: 6 g/dL — ABNORMAL LOW (ref 6.5–8.1)

## 2022-05-09 LAB — PHOSPHORUS: Phosphorus: 5.9 mg/dL — ABNORMAL HIGH (ref 2.5–4.6)

## 2022-05-09 MED ORDER — HEPARIN SODIUM (PORCINE) 1000 UNIT/ML DIALYSIS
2500.0000 [IU] | Freq: Once | INTRAMUSCULAR | Status: AC
  Administered 2022-05-09: 2500 [IU] via INTRAVENOUS_CENTRAL
  Filled 2022-05-09: qty 3

## 2022-05-09 NOTE — Progress Notes (Signed)
  [facility] Progress Note   Subjective:   Seen on HD. Had 4 L UF yest w/ mild late cramping. On HD again this am.   Objective Vitals:   05/09/22 1030 05/09/22 1100 05/09/22 1130 05/09/22 1200  BP: (!) 153/58 (!) 149/73 (!) 159/74 128/61  Pulse: (!) 59 64 72 72  Resp: (!) 21 20 (!) 24 (!) 22  Temp:      TempSrc:      SpO2: 96% 98% 97% 94%  Weight:      Height:       Physical Exam General: Alert male  in NAD Heart: RRR, no murmurs, rubs or gallops Lungs: CTA bilaterally without wheezing, rhonchi or rales Abdomen: Soft, non-distended, +BS  Extremities: No edema b/l lower extremities Dialysis Access: LUE AVF + bruit   OP HD: MWF [facility] 4h   86.4kg   2/2 bath    LUE AVF   Heparin 2500  - venofer 50 mg weekly - mircera 200 mcg IV q 2 wks, last 4/01, due 4/15 - rocaltrol 1.25 mcg po tiw  Assessment/Plan: Acute hypoxic resp failure/rhinovirus-  Still requiring O2, chest CT 4/9 showed mucus plugging - getting prednisone, bronchodilators, cough meds. CXR 05/05/22 atypical infection vs edema. Back on antibiotics.  Volume- has likely lost body wt so we did extra HD yesterday and ghe is getting HD again this am. Lowering volume and dry wt. Should be okay to dc after HD this am.  ESRD: HD MWF. Had extra HD 4/16. HD today to get back on schedule  HTN urgency: BP's okay on hydralazine, doxazosin, irbesartan and metoprolol. As above.  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 not due for ESA yet. Secondary HTPH: Phos controlled, continue lanthanum binder. Ca a bit elevated, not getting VDRA  Hx seizure disorder: Continue home meds. Dispo - hopefully can be dc'd home today       Vinson Moselle, MD 05/09/2022, 12:37 PM  Recent Labs  Lab 05/08/22 0227 05/09/22 0224  HGB 12.1* 12.3*  ALBUMIN 3.2* 3.3*  CALCIUM 9.1 9.3  PHOS 4.8* 5.9*  CREATININE 9.30* 7.52*  K 5.8* 4.4     Inpatient medications:  amLODipine  10 mg Oral QPM   azithromycin  500 mg Oral Daily    dextromethorphan-guaiFENesin  1 tablet Oral BID   doxazosin  8 mg Oral Daily   DULoxetine  30 mg Oral Daily   heparin injection (subcutaneous)  5,000 Units Subcutaneous Q8H   hydrALAZINE  100 mg Oral Q8H   ipratropium-albuterol  3 mL Nebulization BID   irbesartan  150 mg Oral Daily   isosorbide mononitrate  120 mg Oral Daily   lanthanum  1,000 mg Oral TID WC   levETIRAcetam  250 mg Oral Q M,W,F-HD   metoprolol tartrate  100 mg Oral BID   pravastatin  40 mg Oral q1800   predniSONE  40 mg Oral Q breakfast   sodium chloride flush  3 mL Intravenous Q12H   sodium chloride flush  3 mL Intravenous Q12H   traZODone  50 mg Oral QHS    sodium chloride     cefTRIAXone (ROCEPHIN)  IV 2 g (05/08/22 2058)   sodium chloride, acetaminophen, benzonatate, fentaNYL (SUBLIMAZE) injection, hydrALAZINE, ondansetron **OR** ondansetron (ZOFRAN) IV, sodium chloride flush

## 2022-05-09 NOTE — Progress Notes (Signed)
Noted pt did not d/c yesterday and that pt is currently receiving HD and has a d/c order. Contacted [facility] to advise clinic that pt did not d/c and pt currently receiving HD here. Clinic advised of pt's planned d/c for today and that pt should resume care on Friday.   Olivia Canter Renal Navigator [phone]

## 2022-05-09 NOTE — Progress Notes (Signed)
   05/09/22 1242  Vitals  Temp (!) 97.4 F (36.3 C)  Pulse Rate 74  Resp 18  BP (!) 142/56  SpO2 100 %  O2 Device Room Air  Weight 82.7 kg  Type of Weight Post-Dialysis  Oxygen Therapy  Patient Activity (if Appropriate) In bed  Pulse Oximetry Type Continuous  Oximetry Probe Site Changed No  Post Treatment  Dialyzer Clearance Lightly streaked  Duration of HD Treatment -hour(s) 3.19 hour(s)  Liters Processed 79.7  Fluid Removed (mL) 2500 mL  Tolerated HD Treatment Yes  Post-Hemodialysis Comments TX Terminated  AVG/AVF Arterial Site Held (minutes) 8 minutes  AVG/AVF Venous Site Held (minutes) 8 minutes   Received patient in bed to unit.  Alert and oriented.  Informed consent signed and in chart.   TX duration:3.5  Patient tolerated well.  Transported back to the room  Alert, without acute distress.  Hand-off given to patient's nurse.   Access used: LLAF Access issues: no complications  Total UF removed: 2500 Medication(s) given: none [facility]

## 2022-05-10 ENCOUNTER — Telehealth (HOSPITAL_COMMUNITY): Payer: Self-pay | Admitting: Nephrology

## 2022-05-10 ENCOUNTER — Telehealth: Payer: Self-pay

## 2022-05-10 NOTE — Telephone Encounter (Signed)
Transition of care contact from inpatient facility  Date of Discharge: 05/09/22 Date of Contact: 05/10/22 - attempted Method of contact: Phone  Attempted to contact patient to discuss transition of care from inpatient admission. Patient did not answer the phone. Message was left on the patient's voicemail with call back number [phone].  Ozzie Hoyle, PA-C [employer] Pager [phone]

## 2022-05-10 NOTE — Transitions of Care (Post Inpatient/ED Visit) (Signed)
   05/10/2022  Name: Kirk [name] MRN: [id number] DOB: 1960/08/22  Today's TOC FU Call Status: Today's TOC FU Call Status:: Unsuccessul Call (1st Attempt) Unsuccessful Call (1st Attempt) Date: 05/10/22  Attempted to reach the patient regarding the most recent Inpatient/ED visit.  Follow Up Plan: Additional outreach attempts will be made to reach the patient to complete the Transitions of Care (Post Inpatient/ED visit) call.     Antionette Fairy, RN,BSN,CCM [facility] Care Management Care Management Community Coordinator Direct Phone: [phone] Toll Free: [phone] Fax: [phone]

## 2022-05-11 ENCOUNTER — Telehealth: Payer: Self-pay

## 2022-05-11 DIAGNOSIS — D509 Iron deficiency anemia, unspecified: Secondary | ICD-10-CM | POA: Diagnosis not present

## 2022-05-11 DIAGNOSIS — Z992 Dependence on renal dialysis: Secondary | ICD-10-CM | POA: Diagnosis not present

## 2022-05-11 DIAGNOSIS — D631 Anemia in chronic kidney disease: Secondary | ICD-10-CM | POA: Diagnosis not present

## 2022-05-11 DIAGNOSIS — N2581 Secondary hyperparathyroidism of renal origin: Secondary | ICD-10-CM | POA: Diagnosis not present

## 2022-05-11 DIAGNOSIS — N186 End stage renal disease: Secondary | ICD-10-CM | POA: Diagnosis not present

## 2022-05-11 NOTE — Transitions of Care (Post Inpatient/ED Visit) (Signed)
   05/11/2022  Name: AMORY SIMONETTI MRN: [id number] DOB: 05-16-1960  Today's TOC FU Call Status: Today's TOC FU Call Status:: Unsuccessful Call (2nd Attempt) Unsuccessful Call (2nd Attempt) Date: 05/11/22  Attempted to reach the patient regarding the most recent Inpatient/ED visit.  Follow Up Plan: Additional outreach attempts will be made to reach the patient to complete the Transitions of Care (Post Inpatient/ED visit) call.     Antionette Fairy, RN,BSN,CCM [facility] Care Management Care Management Community Coordinator Direct Phone: [phone] Toll Free: [phone] Fax: [phone]

## 2022-05-11 NOTE — Transitions of Care (Post Inpatient/ED Visit) (Signed)
   05/11/2022  Name: Kirk [name] MRN: [id number] DOB: Jun 14, 1960  Today's TOC FU Call Status: Today's TOC FU Call Status:: Unsuccessful Call (3rd Attempt) Unsuccessful Call (3rd Attempt) Date: 05/11/22  Attempted to reach the patient regarding the most recent Inpatient/ED visit.  Follow Up Plan: No further outreach attempts will be made at this time. We have been unable to contact the patient.    Antionette Fairy, RN,BSN,CCM [facility] Care Management Care Management Community Coordinator Direct Phone: [phone] Toll Free: [phone] Fax: [phone]

## 2022-05-14 DIAGNOSIS — Z992 Dependence on renal dialysis: Secondary | ICD-10-CM | POA: Diagnosis not present

## 2022-05-14 DIAGNOSIS — D509 Iron deficiency anemia, unspecified: Secondary | ICD-10-CM | POA: Diagnosis not present

## 2022-05-14 DIAGNOSIS — N2581 Secondary hyperparathyroidism of renal origin: Secondary | ICD-10-CM | POA: Diagnosis not present

## 2022-05-14 DIAGNOSIS — N186 End stage renal disease: Secondary | ICD-10-CM | POA: Diagnosis not present

## 2022-05-14 DIAGNOSIS — D631 Anemia in chronic kidney disease: Secondary | ICD-10-CM | POA: Diagnosis not present

## 2022-05-15 ENCOUNTER — Ambulatory Visit (HOSPITAL_BASED_OUTPATIENT_CLINIC_OR_DEPARTMENT_OTHER): Payer: Medicare Other | Admitting: Family

## 2022-05-16 DIAGNOSIS — N2581 Secondary hyperparathyroidism of renal origin: Secondary | ICD-10-CM | POA: Diagnosis not present

## 2022-05-16 DIAGNOSIS — D631 Anemia in chronic kidney disease: Secondary | ICD-10-CM | POA: Diagnosis not present

## 2022-05-16 DIAGNOSIS — Z992 Dependence on renal dialysis: Secondary | ICD-10-CM | POA: Diagnosis not present

## 2022-05-16 DIAGNOSIS — D509 Iron deficiency anemia, unspecified: Secondary | ICD-10-CM | POA: Diagnosis not present

## 2022-05-16 DIAGNOSIS — N186 End stage renal disease: Secondary | ICD-10-CM | POA: Diagnosis not present

## 2022-05-18 ENCOUNTER — Other Ambulatory Visit: Payer: Self-pay | Admitting: Family

## 2022-05-18 DIAGNOSIS — Z992 Dependence on renal dialysis: Secondary | ICD-10-CM | POA: Diagnosis not present

## 2022-05-18 DIAGNOSIS — N186 End stage renal disease: Secondary | ICD-10-CM | POA: Diagnosis not present

## 2022-05-18 DIAGNOSIS — N2581 Secondary hyperparathyroidism of renal origin: Secondary | ICD-10-CM | POA: Diagnosis not present

## 2022-05-18 DIAGNOSIS — D631 Anemia in chronic kidney disease: Secondary | ICD-10-CM | POA: Diagnosis not present

## 2022-05-18 DIAGNOSIS — D509 Iron deficiency anemia, unspecified: Secondary | ICD-10-CM | POA: Diagnosis not present

## 2022-05-18 NOTE — Telephone Encounter (Signed)
Medication was refilled by another provider. Medication pend and sent to PCP Ngetich, Donalee Citrin, NP

## 2022-05-21 DIAGNOSIS — N2581 Secondary hyperparathyroidism of renal origin: Secondary | ICD-10-CM | POA: Diagnosis not present

## 2022-05-21 DIAGNOSIS — D631 Anemia in chronic kidney disease: Secondary | ICD-10-CM | POA: Diagnosis not present

## 2022-05-21 DIAGNOSIS — N186 End stage renal disease: Secondary | ICD-10-CM | POA: Diagnosis not present

## 2022-05-21 DIAGNOSIS — D509 Iron deficiency anemia, unspecified: Secondary | ICD-10-CM | POA: Diagnosis not present

## 2022-05-21 DIAGNOSIS — Z992 Dependence on renal dialysis: Secondary | ICD-10-CM | POA: Diagnosis not present

## 2022-05-22 ENCOUNTER — Encounter: Payer: Self-pay | Admitting: Family

## 2022-05-22 ENCOUNTER — Ambulatory Visit (INDEPENDENT_AMBULATORY_CARE_PROVIDER_SITE_OTHER): Payer: Medicare Other | Admitting: Family

## 2022-05-22 VITALS — BP 108/52 | HR 65 | Temp 98.4°F | Resp 16 | Ht 67.0 in | Wt 187.0 lb

## 2022-05-22 DIAGNOSIS — F411 Generalized anxiety disorder: Secondary | ICD-10-CM | POA: Diagnosis not present

## 2022-05-22 DIAGNOSIS — I120 Hypertensive chronic kidney disease with stage 5 chronic kidney disease or end stage renal disease: Secondary | ICD-10-CM

## 2022-05-22 DIAGNOSIS — D638 Anemia in other chronic diseases classified elsewhere: Secondary | ICD-10-CM

## 2022-05-22 DIAGNOSIS — I700 Atherosclerosis of aorta: Secondary | ICD-10-CM | POA: Diagnosis not present

## 2022-05-22 DIAGNOSIS — F339 Major depressive disorder, recurrent, unspecified: Secondary | ICD-10-CM

## 2022-05-22 DIAGNOSIS — R7303 Prediabetes: Secondary | ICD-10-CM | POA: Diagnosis not present

## 2022-05-22 DIAGNOSIS — E782 Mixed hyperlipidemia: Secondary | ICD-10-CM | POA: Diagnosis not present

## 2022-05-22 DIAGNOSIS — R569 Unspecified convulsions: Secondary | ICD-10-CM

## 2022-05-22 DIAGNOSIS — N186 End stage renal disease: Secondary | ICD-10-CM | POA: Diagnosis not present

## 2022-05-22 DIAGNOSIS — G4733 Obstructive sleep apnea (adult) (pediatric): Secondary | ICD-10-CM | POA: Diagnosis not present

## 2022-05-22 MED ORDER — DULOXETINE HCL 30 MG PO CPEP: 30.0000 mg | ORAL_CAPSULE | Freq: Every day | ORAL | 1 refills | Status: DC

## 2022-05-22 MED ORDER — HYDRALAZINE HCL 100 MG PO TABS
100.0000 mg | ORAL_TABLET | Freq: Three times a day (TID) | ORAL | 3 refills | Status: DC
Start: 2022-05-22 — End: 2023-03-23

## 2022-05-22 MED ORDER — TRAZODONE HCL 50 MG PO TABS: 25.0000 mg | ORAL_TABLET | Freq: Every evening | ORAL | 1 refills | Status: DC | PRN

## 2022-05-22 NOTE — Progress Notes (Signed)
Provider: Richarda Blade FNP-C  Tareq Dwan, Donalee Citrin, NP  Patient Care Team: Cass Edinger, Donalee Citrin, NP as PCP - General (Family Medicine) Rennis Golden Lisette Abu, MD as PCP - Cardiology (Cardiology) Griselda Miner, MD as Consulting Physician (General Surgery) Coralyn Helling, MD as Consulting Physician (Pulmonary Disease) Lauris Poag, MD as Consulting Physician (Nephrology) Sherren Kerns, MD (Inactive) as Consulting Physician (Vascular Surgery) Chrystie Nose, MD as Consulting Physician (Cardiology) Rachael Fee, MD as Attending Physician (Gastroenterology) Camille Bal, MD as Consulting Physician (Nephrology) Clarene Duke Karma Lew, RN as [facility] Care Management  Extended Emergency Contact Information Primary Emergency Contact: Jasmine Awe Address: [address]          [place], [place] [address] D[place] of [place] Home Phone: [phone] Mobile Phone: [phone] Relation: Spouse  Code Status:  Full Code  Goals of care: Advanced Directive information    04/29/2022    3:01 AM  Advanced Directives  Would patient like information on creating a medical advance directive? No - Patient declined     Chief Complaint  Patient presents with   Hospitalization Follow-up    In hospital 4/6-4/17 for rhinovirus. Treated for Htn crisis. He is doing better since then. Has not had eye exam yet. Lost 30 lbs in last month without trying    HPI:  Pt is a 62 y.o. male seen today for an acute visit for Transition of care post hospitalization from 04/28/2022 - 05/09/2022 for rhinovirus Pneumonia after presenting to the emergency department with shortness of breath, cough and headache.  CT scan was negative for acute intracranial abnormalities.  COVID-19, influenza, RSV PCR were negative.  Labs were unremarkable.  Respiratory panel showed rhinovirus positive.  Was treated with DuoNeb, Solu-Medrol, Mucinex incentive spirometer, flutter valve and oxygen via nasal cannula due to  hypoxia.  Plan was to discharge home on oxygen but oxygen saturations improved prior to discharge.  Hemodialysis was continued per nephrology.  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)    Anxiety    Asthma    Asthma    moderate persistent   Carpal tunnel syndrome    Cellulitis and abscess of trunk    Cholelithiasis 07/13/2014   Chronic headaches    Debility, unspecified    Dermatophytosis of the body    Dysrhythmia    history of   Edema    End stage renal disease on dialysis (HCC)    "MWF; [facility]" (10/21/2014)   Essential hypertension, benign    GERD (gastroesophageal reflux disease)    Gout, unspecified    HTN (hypertension)    Hypertrophy of prostate without urinary obstruction and other lower urinary tract symptoms (LUTS)    Hypotension, unspecified    Impotence of organic origin    Insomnia, unspecified    Kidney replaced by transplant    Localization-related (focal) (partial) epilepsy and epileptic syndromes with complex partial seizures, without mention of intractable epilepsy    12-15-19- Wife states he has NEVER had a seizure    Lumbago    Memory loss    OSA on CPAP    Other and unspecified hyperlipidemia    controlled /managed per wife    Other chronic nonalcoholic liver disease    Other malaise and fatigue    Other nonspecific abnormal serum enzyme  levels    Pain in joint, lower leg    Pain in joint, upper arm    Pneumonia "several times"   PONV (postoperative nausea and vomiting)    Renal dialysis status(V45.11) 02/05/2010   restarted 01/02/13 ofter renal trransplant failure   Secondary hyperparathyroidism (of renal origin)    Shortness of breath    Sleep apnea    wears cpap    Tension headache    Unspecified constipation    Unspecified essential hypertension    Unspecified  hereditary and idiopathic peripheral neuropathy    Unspecified vitamin D deficiency    Past Surgical History:  Procedure Laterality Date   AV FISTULA PLACEMENT Left ?2010   "forearm; at [facility]"   BACK SURGERY     CARDIAC CATHETERIZATION  03/21/2011   CHOLECYSTECTOMY N/A 10/21/2014   Procedure: LAPAROSCOPIC CHOLECYSTECTOMY WITH INTRAOPERATIVE CHOLANGIOGRAM;  Surgeon: Chevis Pretty III, MD;  Location: [facility] OR;  Service: General;  Laterality: N/A;   COLONOSCOPY     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   IR ANGIO INTRA EXTRACRAN SEL INTERNAL CAROTID UNI R MOD SED  11/30/2021   IR ANGIOGRAM FOLLOW UP STUDY  11/30/2021   IR BONE MARROW BIOPSY & ASPIRATION  03/27/2022   IR NEURO EACH ADD'L AFTER BASIC UNI RIGHT (MS)  11/30/2021   IR TRANSCATH/EMBOLIZ  11/30/2021   KIDNEY TRANSPLANT  08/17/2011   [facility]    LAPAROSCOPIC CHOLECYSTECTOMY  10/21/2014   w/IOC   LEFT HEART CATHETERIZATION WITH CORONARY ANGIOGRAM N/A 03/21/2011   Procedure: LEFT HEART CATHETERIZATION WITH CORONARY ANGIOGRAM;  Surgeon: Chrystie Nose, MD;  Location: [facility] CATH LAB;  Service: Cardiovascular;  Laterality: N/A;   NEPHRECTOMY  08/2013   removed transplaned kidney   POLYPECTOMY     POSTERIOR FUSION CERVICAL SPINE  06/25/2012   for spinal stenosis   RADIOLOGY WITH ANESTHESIA N/A 11/30/2021   Procedure: RIGHT MIDDLE MENINGEAL ARTERY EMBOLIZATION;  Surgeon: Radiologist, Medication, MD;  Location: [facility] OR;  Service: Radiology;  Laterality: N/A;   VASECTOMY  2010    Allergies  Allergen Reactions   Codeine Nausea And Vomiting   Hydrocodone-Acetaminophen Nausea And Vomiting    Outpatient Encounter Medications as of 05/22/2022  Medication Sig   acetaminophen (TYLENOL) 500 MG tablet Take 1 tablet (500 mg total) by mouth every 6 (six) hours as needed for moderate pain.   amLODipine (NORVASC) 10 MG tablet Take 1 tablet (10 mg total) by mouth every evening.   aspirin 81 MG chewable tablet Chew 81 mg by mouth  daily.   benzonatate (TESSALON) 200 MG capsule Take 1 capsule (200 mg total) by mouth 3 (three) times daily as needed for cough.   budesonide-formoterol (SYMBICORT) 160-4.5 MCG/ACT inhaler Inhale 2 puffs into the lungs as needed.   calcitRIOL (ROCALTROL) 0.5 MCG capsule Take 2 capsules (1 mcg total) by mouth every Monday, Wednesday, and Friday with hemodialysis.   Cholecalciferol (VITAMIN D3) 25 MCG (1000 UT) CAPS Take 4,000 Units by mouth daily.   cinacalcet (SENSIPAR) 30 MG tablet Take 2 tablets (60 mg total) by mouth 3 (three) times a week.   dextromethorphan-guaiFENesin (MUCINEX DM) 30-600 MG 12hr tablet Take 1 tablet by mouth 2 (two) times daily.   diphenhydrAMINE (BENADRYL) 25 mg capsule Take 25 mg by mouth every 6 (six) hours as needed for allergies.   doxazosin (CARDURA) 8 MG tablet Take 1 tablet (8 mg total) by mouth daily.   DULoxetine (CYMBALTA) 30 MG capsule TAKE 1 CAPSULE  BY MOUTH EVERY DAY   FOSRENOL 1000 MG PACK Take 1 packet by mouth 3 (three) times daily.   hydrALAZINE (APRESOLINE) 100 MG tablet Take 1 tablet (100 mg total) by mouth 3 (three) times daily.   isosorbide mononitrate (IMDUR) 120 MG 24 hr tablet Take 1 tablet (120 mg total) by mouth daily.   levETIRAcetam (KEPPRA) 250 MG tablet TAKE 1 TABLET (250 MG TOTAL) BY MOUTH EVERY MONDAY, WEDNESDAY, AND FRIDAY WITH HEMODIALYSIS.   methocarbamol (ROBAXIN) 750 MG tablet Take 1 tablet (750 mg total) by mouth 4 (four) times daily.   metoprolol tartrate (LOPRESSOR) 100 MG tablet Take 1 tablet (100 mg total) by mouth 2 (two) times daily.   multivitamin (RENA-VIT) TABS tablet Take 1 tablet by mouth daily.   ondansetron (ZOFRAN-ODT) 4 MG disintegrating tablet TAKE 1 TABLET BY MOUTH EVERY 8 HOURS AS NEEDED FOR NAUSEA AND VOMITING   oxyCODONE (OXY IR/ROXICODONE) 5 MG immediate release tablet Take 1-2 tablets (5-10 mg total) by mouth every 4 (four) hours as needed for moderate pain.   pantoprazole (PROTONIX) 40 MG tablet Take 1 tablet (40  mg total) by mouth 2 (two) times daily before a meal.   pravastatin (PRAVACHOL) 40 MG tablet TAKE 1 TABLET BY MOUTH EVERY DAY IN THE EVENING   sevelamer carbonate (RENVELA) 800 MG tablet Take 2 tablets (1,600 mg total) by mouth as needed (snacks).   sevelamer carbonate (RENVELA) 800 MG tablet Take 4 tablets (3,200 mg total) by mouth 3 (three) times daily with meals.   sucralfate (CARAFATE) 1 GM/10ML suspension Take 10 mLs (1 g total) by mouth 4 (four) times daily -  with meals and at bedtime.   traZODone (DESYREL) 50 MG tablet TAKE 1/2 TO 1 TABLET BY MOUTH AT BEDTIME AS NEEDED FOR SLEEP   valsartan (DIOVAN) 160 MG tablet Take 1 tablet (160 mg total) by mouth daily.   No facility-administered encounter medications on file as of 05/22/2022.    Review of Systems  Constitutional:  Negative for appetite change, chills, fatigue, fever and unexpected weight change.  HENT:  Negative for congestion, dental problem, ear discharge, ear pain, nosebleeds, postnasal drip, rhinorrhea, sinus pressure, sinus pain, sneezing, sore throat, tinnitus and trouble swallowing.   Eyes:  Negative for pain, discharge, redness, itching and visual disturbance.  Respiratory:  Negative for cough, chest tightness, shortness of breath and wheezing.   Cardiovascular:  Negative for chest pain, palpitations and leg swelling.  Gastrointestinal:  Negative for abdominal distention, abdominal pain, blood in stool, constipation, diarrhea, nausea and vomiting.  Endocrine: Negative for cold intolerance, heat intolerance, polydipsia, polyphagia and polyuria.  Genitourinary:        On HD M,W and F  Musculoskeletal:  Negative for arthralgias, back pain, gait problem, joint swelling, myalgias, neck pain and neck stiffness.  Skin:  Negative for color change, pallor, rash and wound.  Neurological:  Positive for headaches. Negative for dizziness, syncope, speech difficulty, weakness, light-headedness and numbness.       Chronic headaches    Hematological:  Does not bruise/bleed easily.  Psychiatric/Behavioral:  Negative for agitation, behavioral problems, confusion, hallucinations, self-injury, sleep disturbance and suicidal ideas. The patient is not nervous/anxious.     Immunization History  Administered Date(s) Administered   Influenza Whole 11/01/2010   Influenza, Seasonal, Injecte, Preservative Fre 10/29/2012   Influenza-Unspecified 11/21/2011, 11/05/2012, 09/22/2013, 09/22/2017, 11/23/2019, 10/28/2020, 12/04/2020   Moderna Sars-Covid-2 Vaccination 04/06/2019, 05/07/2019, 03/31/2020   PPD Test 12/26/2012   Pneumococcal Polysaccharide-23 11/01/2010, 10/29/2012   Pneumococcal-Unspecified 11/05/2012  Tdap 01/22/2006, 06/08/2021   Pertinent  Health Maintenance Due  Topic Date Due   HEMOGLOBIN A1C  11/25/2021   INFLUENZA VACCINE  08/23/2022   COLONOSCOPY (Pts 45-107yrs Insurance coverage will need to be confirmed)  12/29/2026   FOOT EXAM  Discontinued   OPHTHALMOLOGY EXAM  Discontinued      11/30/2021   10:00 AM 11/30/2021    8:36 PM 12/01/2021    8:00 AM 12/07/2021   11:07 AM 02/28/2022    3:52 PM  Fall Risk  Falls in the past year?    0 1  Was there an injury with Fall?    0 1  Fall Risk Category Calculator    0 3  Fall Risk Category (Retired)    Low   (RETIRED) Patient Fall Risk Level High fall risk High fall risk High fall risk Low fall risk   Patient at Risk for Falls Due to    No Fall Risks No Fall Risks  Fall risk Follow up    Falls evaluation completed Falls evaluation completed   Functional Status Survey:    Vitals:   05/22/22 1324  BP: (!) 108/52  Pulse: 65  Resp: 16  Temp: 98.4 F (36.9 C)  TempSrc: Temporal  SpO2: 97%  Weight: 187 lb (84.8 kg)  Height: 5\' 7"  (1.702 m)   Body mass index is 29.29 kg/m. Physical Exam Vitals reviewed.  Constitutional:      General: He is not in acute distress.    Appearance: Normal appearance. He is overweight. He is not ill-appearing or diaphoretic.   HENT:     Head: Normocephalic.     Right Ear: Ear canal and external ear normal. There is no impacted cerumen. Tympanic membrane is perforated.     Left Ear: Tympanic membrane, ear canal and external ear normal. There is no impacted cerumen.     Nose: Nose normal. No congestion or rhinorrhea.     Mouth/Throat:     Mouth: Mucous membranes are moist.     Pharynx: Oropharynx is clear. No oropharyngeal exudate or posterior oropharyngeal erythema.  Eyes:     General: No scleral icterus.       Right eye: No discharge.        Left eye: No discharge.     Extraocular Movements: Extraocular movements intact.     Conjunctiva/sclera: Conjunctivae normal.     Pupils: Pupils are equal, round, and reactive to light.  Neck:     Vascular: No carotid bruit.  Cardiovascular:     Rate and Rhythm: Normal rate and regular rhythm.     Pulses: Normal pulses.     Heart sounds: Murmur heard.     No friction rub. No gallop.  Pulmonary:     Effort: Pulmonary effort is normal. No respiratory distress.     Breath sounds: Normal breath sounds. No wheezing, rhonchi or rales.  Chest:     Chest wall: No tenderness.  Abdominal:     General: Bowel sounds are normal. There is no distension.     Palpations: Abdomen is soft. There is no mass.     Tenderness: There is no abdominal tenderness. There is no right CVA tenderness, left CVA tenderness, guarding or rebound.  Musculoskeletal:        General: No swelling or tenderness. Normal range of motion.     Cervical back: Normal range of motion. No rigidity or tenderness.     Right lower leg: No edema.     Left lower  leg: No edema.  Lymphadenopathy:     Cervical: No cervical adenopathy.  Skin:    General: Skin is warm and dry.     Coloration: Skin is not pale.     Findings: No bruising, erythema, lesion or rash.  Neurological:     Mental Status: He is alert and oriented to person, place, and time.     Cranial Nerves: No cranial nerve deficit.     Sensory: No  sensory deficit.     Motor: No weakness.     Coordination: Coordination normal.     Gait: Gait abnormal.  Psychiatric:        Mood and Affect: Mood normal.        Speech: Speech normal.        Behavior: Behavior normal.        Thought Content: Thought content normal.        Judgment: Judgment normal.     Labs reviewed: Recent Labs    05/07/22 0327 05/08/22 0227 05/09/22 0224  NA 135 135 137  K 5.5* 5.8* 4.4  CL 96* 97* 97*  CO2 22 26 27   GLUCOSE 102* 215* 99  BUN 98* 66* 56*  CREATININE 12.34* 9.30* 7.52*  CALCIUM 9.7 9.1 9.3  MG 2.2 1.9 1.9  PHOS 4.6 4.8* 5.9*   Recent Labs    05/07/22 0327 05/08/22 0227 05/09/22 0224  AST 11* 17 12*  ALT 15 18 17   ALKPHOS 51 53 50  BILITOT 1.4* 1.1 0.7  PROT 5.9* 5.8* 6.0*  ALBUMIN 3.4* 3.2* 3.3*   Recent Labs    05/07/22 0327 05/08/22 0227 05/09/22 0224  WBC 6.3 4.9 8.0  NEUTROABS 5.0 4.5 6.4  HGB 12.4* 12.1* 12.3*  HCT 36.5* 37.3* 36.4*  MCV 92.6 94.9 93.3  PLT 94* 94* 103*   Lab Results  Component Value Date   TSH 2.245 11/16/2021   Lab Results  Component Value Date   HGBA1C 4.8 05/25/2021   Lab Results  Component Value Date   CHOL 114 05/05/2022   HDL 41 05/05/2022   LDLCALC 56 05/05/2022   TRIG 86 05/05/2022   CHOLHDL 2.8 05/05/2022    Significant Diagnostic Results in last 30 days:  DG CHEST PORT 1 VIEW  Result Date: 05/05/2022 CLINICAL DATA:  200808 Hypoxia [id number] EXAM: PORTABLE CHEST - 1 VIEW COMPARISON:  04/28/2022 FINDINGS: The heart size and mediastinal contours are within normal limits. There is diffuse pulmonary interstitial prominence which could be seen with atypical infection, edema or interstitial lung disease. There is no focal consolidation. No pneumothorax or pleural effusion. Aorta is calcified. There are thoracic degenerative changes. IMPRESSION: Nonspecific interstitial prominence consistent with atypical infection, edema or interstitial lung disease. No focal consolidation.  Electronically Signed   By: Layla Maw M.D.   On: 05/05/2022 09:34   CT CHEST WO CONTRAST  Result Date: 05/01/2022 CLINICAL DATA:  Respiratory illness EXAM: CT CHEST WITHOUT CONTRAST TECHNIQUE: Multidetector CT imaging of the chest was performed following the standard protocol without IV contrast. RADIATION DOSE REDUCTION: This exam was performed according to the departmental dose-optimization program which includes automated exposure control, adjustment of the mA and/or kV according to patient size and/or use of iterative reconstruction technique. COMPARISON:  Chest CT dated August 01, 2021 FINDINGS: Cardiovascular: Cardiomegaly. Trace pericardial effusions. Normal caliber thoracic aorta with moderate atherosclerotic disease. Severe coronary artery calcifications. Mediastinum/Nodes: Esophagus and thyroid unremarkable. No pathologically enlarged nodes seen in the chest. Lungs/Pleura: Central airways are patent. Mucous plugging, mild  consolidations and numerous small pulmonary nodules most pronounced in the lower lungs, slightly worsened when compared with the prior exam. Associated lower lobe dendriform pulmonary ossification, similar to prior. No pleural effusion or pneumothorax. Upper Abdomen: Cholecystectomy clips. 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 No aggressive appearing osseous lesions. IMPRESSION: 1. Mucous plugging, mild consolidations and numerous small pulmonary nodules most pronounced in the lower lungs, slightly worsened when compared with the prior exam. Findings are likely due to infection or aspiration. 2. Severe coronary artery calcifications. 3. Aortic Atherosclerosis (ICD10-I70.0). Electronically Signed   By: Allegra Lai M.D.   On: 05/01/2022 13:02   EEG adult  Result Date: 04/30/2022 Charlsie Quest, MD     04/30/2022  4:00 PM Patient Name: GATLIN GUTHRIE MRN: [id number] Epilepsy Attending: Charlsie Quest Referring Physician/Provider:  Kathlen Mody, MD Date: 04/30/2022 Duration: 22.36 mins Patient history: 62yo M getting EEG to evaluate for seizure. Level of alertness: Awake, asleep AEDs during EEG study: LEV Technical aspects: This EEG study was done with scalp electrodes positioned according to the 10-20 International system of electrode placement. Electrical activity was reviewed with band pass filter of 1-70Hz , sensitivity of 7 uV/mm, display speed of 22mm/sec with a 60Hz  notched filter applied as appropriate. EEG data were recorded continuously and digitally stored.  Video monitoring was available and reviewed as appropriate. Description: The posterior dominant rhythm consists of 9-10 Hz activity of moderate voltage (25-35 uV) seen predominantly in posterior head regions, symmetric and reactive to eye opening and eye closing. Sleep was characterized by vertex waves, sleep spindles (12 to 14 Hz), maximal frontocentral region.  Hyperventilation and photic stimulation were not performed.   IMPRESSION: This study is within normal limits. No seizures or epileptiform discharges were seen throughout the recording. A normal interictal EEG does not exclude the diagnosis of epilepsy. Charlsie Quest   CT Head Wo Contrast  Result Date: 04/28/2022 CLINICAL DATA:  New onset headache. EXAM: CT HEAD WITHOUT CONTRAST TECHNIQUE: Contiguous axial images were obtained from the base of the skull through the vertex without intravenous contrast. RADIATION DOSE REDUCTION: This exam was performed according to the departmental dose-optimization program which includes automated exposure control, adjustment of the mA and/or kV according to patient size and/or use of iterative reconstruction technique. COMPARISON:  CT dated 02/05/2022 FINDINGS: Brain: There is mild right frontoparietal convexity dural thickening in the location of a previous subdural hematoma. This was seen on the last CT and again is no more than 2-3 mm in thickness, alternatively could be a small  thin chronic subdural hematoma. No recent parenchymal or extra-axial bleed preserved ventral and no cortical based infarct is seen. There is mild atrophy and small-vessel disease. The ventricles are normal in size and position. Basal cisterns are clear. Vascular: Calcific plaques both siphons both distal vertebral arteries. No hyperdense central vessel. Skull: Negative for fractures or focal lesions. Sinuses/Orbits: No acute findings. There is chronic fluid opacification of the lower right mastoid air cells. Remaining bilateral mastoids are clear. Trace chronic membrane disease maxillary sinuses. Other: None. IMPRESSION: 1. No acute intracranial CT findings or interval changes. 2. Mild right frontoparietal convexity dural thickening in the location of a previous subdural hematoma. This is no more than 2-3 mm in thickness without mass effect, alternatively could be a small chronic subdural hematoma. 3. Atrophy and small-vessel disease. 4. Chronic fluid opacification of the lower right mastoid air cells. Electronically Signed   By: Almira Bar M.D.   On: 04/28/2022  22:53   DG Chest 2 View  Result Date: 04/28/2022 CLINICAL DATA:  Possible sepsis EXAM: CHEST - 2 VIEW COMPARISON:  11/15/2021 FINDINGS: Cardiac shadow is stable. Aortic calcifications are noted. The lungs are well aerated bilaterally. Mild interstitial prominence is again seen in the bases stable from the prior study. No bony abnormality is seen. IMPRESSION: Chronic interstitial prominence in the bases. No acute abnormality is noted. Electronically Signed   By: Alcide Clever M.D.   On: 04/28/2022 21:24    Assessment/Plan 1. Benign hypertension with ESRD (end-stage renal disease) (HCC) Blood pressure well-controlled -Continue on valsartan, Metroprolol, Imdur, hydralazine, doxazosin and amlodipine -Continue to monitor blood pressure at home - hydrALAZINE (APRESOLINE) 100 MG tablet; Take 1 tablet (100 mg total) by mouth 3 (three) times daily.   Dispense: 270 tablet; Refill: 3 - CBC with Differential/Platelet  2. Generalized anxiety disorder Stable -Continue on Cymbalta - DULoxetine (CYMBALTA) 30 MG capsule; Take 1 capsule (30 mg total) by mouth daily.  Dispense: 90 capsule; Refill: 1  3. Major depression, recurrent, chronic (HCC) Mood stable -Continue on Cymbalta and trazodone - DULoxetine (CYMBALTA) 30 MG capsule; Take 1 capsule (30 mg total) by mouth daily.  Dispense: 90 capsule; Refill: 1  4. Prediabetes Has improved - Hemoglobin A1c  5. Seizure (HCC) No recent seizure activity reported -Continue on Keppra  6. ESRD (end stage renal disease) (HCC) Continue on hemodialysis on Monday Wednesday and Friday -Continue to follow-up with a nephrologist  7. Anemia of chronic disease Chronic Recent hemoglobin stable at  baseline 8. OSA on CPAP Continue on CPAP machine -Continue to follow-up with pulmonary specialist  9. Mixed hyperlipidemia LDL at goal Continue on pravastatin -Dietary modification and exercise as tolerated  10. Aortic atherosclerosis (HCC) Noted on recent CT scan of the chest done 05/01/2022 -Continue on aspirin and pravastatin  Family/ staff Communication: Reviewed plan of care with patient and wife verbalized understanding  Labs/tests ordered:   - CBC with Differential/Platelet - Hemoglobin A1c  Next Appointment: Return if symptoms worsen or fail to improve.   Caesar Bookman, NP

## 2022-05-23 DIAGNOSIS — N186 End stage renal disease: Secondary | ICD-10-CM | POA: Diagnosis not present

## 2022-05-23 DIAGNOSIS — D631 Anemia in chronic kidney disease: Secondary | ICD-10-CM | POA: Diagnosis not present

## 2022-05-23 DIAGNOSIS — Z992 Dependence on renal dialysis: Secondary | ICD-10-CM | POA: Diagnosis not present

## 2022-05-23 DIAGNOSIS — T8612 Kidney transplant failure: Secondary | ICD-10-CM | POA: Diagnosis not present

## 2022-05-23 DIAGNOSIS — N2581 Secondary hyperparathyroidism of renal origin: Secondary | ICD-10-CM | POA: Diagnosis not present

## 2022-05-23 LAB — CBC WITH DIFFERENTIAL/PLATELET
Absolute Monocytes: 312 cells/uL (ref 200–950)
Basophils Absolute: 21 cells/uL (ref 0–200)
Basophils Relative: 0.7 %
Eosinophils Absolute: 81 cells/uL (ref 15–500)
Eosinophils Relative: 2.7 %
HCT: 32.3 % — ABNORMAL LOW (ref 38.5–50.0)
Hemoglobin: 10.4 g/dL — ABNORMAL LOW (ref 13.2–17.1)
Lymphs Abs: 702 cells/uL — ABNORMAL LOW (ref 850–3900)
MCH: 30.7 pg (ref 27.0–33.0)
MCHC: 32.2 g/dL (ref 32.0–36.0)
MCV: 95.3 fL (ref 80.0–100.0)
MPV: 12.5 fL (ref 7.5–12.5)
Monocytes Relative: 10.4 %
Neutro Abs: 1884 cells/uL (ref 1500–7800)
Neutrophils Relative %: 62.8 %
Platelets: 59 10*3/uL — ABNORMAL LOW (ref 140–400)
RBC: 3.39 10*6/uL — ABNORMAL LOW (ref 4.20–5.80)
RDW: 14.2 % (ref 11.0–15.0)
Total Lymphocyte: 23.4 %
WBC: 3 10*3/uL — ABNORMAL LOW (ref 3.8–10.8)

## 2022-05-23 LAB — HEMOGLOBIN A1C
Hgb A1c MFr Bld: 5.3 % of total Hgb (ref <5.7)
Mean Plasma Glucose: 105 mg/dL
eAG (mmol/L): 5.8 mmol/L

## 2022-05-25 DIAGNOSIS — I700 Atherosclerosis of aorta: Secondary | ICD-10-CM | POA: Insufficient documentation

## 2022-05-25 DIAGNOSIS — D631 Anemia in chronic kidney disease: Secondary | ICD-10-CM | POA: Diagnosis not present

## 2022-05-25 DIAGNOSIS — F411 Generalized anxiety disorder: Secondary | ICD-10-CM | POA: Insufficient documentation

## 2022-05-25 DIAGNOSIS — N186 End stage renal disease: Secondary | ICD-10-CM | POA: Diagnosis not present

## 2022-05-25 DIAGNOSIS — F339 Major depressive disorder, recurrent, unspecified: Secondary | ICD-10-CM | POA: Insufficient documentation

## 2022-05-25 DIAGNOSIS — Z992 Dependence on renal dialysis: Secondary | ICD-10-CM | POA: Diagnosis not present

## 2022-05-25 DIAGNOSIS — N2581 Secondary hyperparathyroidism of renal origin: Secondary | ICD-10-CM | POA: Diagnosis not present

## 2022-05-25 HISTORY — DX: Generalized anxiety disorder: F41.1

## 2022-05-28 DIAGNOSIS — D631 Anemia in chronic kidney disease: Secondary | ICD-10-CM | POA: Diagnosis not present

## 2022-05-28 DIAGNOSIS — Z992 Dependence on renal dialysis: Secondary | ICD-10-CM | POA: Diagnosis not present

## 2022-05-28 DIAGNOSIS — N186 End stage renal disease: Secondary | ICD-10-CM | POA: Diagnosis not present

## 2022-05-28 DIAGNOSIS — N2581 Secondary hyperparathyroidism of renal origin: Secondary | ICD-10-CM | POA: Diagnosis not present

## 2022-05-30 ENCOUNTER — Telehealth: Payer: Self-pay

## 2022-05-30 DIAGNOSIS — N2581 Secondary hyperparathyroidism of renal origin: Secondary | ICD-10-CM | POA: Diagnosis not present

## 2022-05-30 DIAGNOSIS — D631 Anemia in chronic kidney disease: Secondary | ICD-10-CM | POA: Diagnosis not present

## 2022-05-30 DIAGNOSIS — N186 End stage renal disease: Secondary | ICD-10-CM | POA: Diagnosis not present

## 2022-05-30 DIAGNOSIS — Z992 Dependence on renal dialysis: Secondary | ICD-10-CM | POA: Diagnosis not present

## 2022-05-30 NOTE — Telephone Encounter (Signed)
Message left on clinical intake voicemail:  Chyrl Civatte called requesting to speak with Synetta Fail only in regards to some mental issues her husband is having.  I returned call to [facility] in hopes of helping her and she reiterated that she would only like to speak with Synetta Fail about her husbands condition. I did inform her that Synetta Fail is offsite and will call when time allowed.

## 2022-05-30 NOTE — Telephone Encounter (Signed)
LMOM to return call.

## 2022-05-31 ENCOUNTER — Encounter: Payer: Self-pay | Admitting: Family

## 2022-05-31 ENCOUNTER — Ambulatory Visit (INDEPENDENT_AMBULATORY_CARE_PROVIDER_SITE_OTHER): Payer: Medicare Other | Admitting: Family

## 2022-05-31 DIAGNOSIS — F411 Generalized anxiety disorder: Secondary | ICD-10-CM | POA: Diagnosis not present

## 2022-05-31 DIAGNOSIS — F339 Major depressive disorder, recurrent, unspecified: Secondary | ICD-10-CM | POA: Diagnosis not present

## 2022-05-31 MED ORDER — DULOXETINE HCL 60 MG PO CPEP
60.0000 mg | ORAL_CAPSULE | Freq: Every day | ORAL | 1 refills | Status: DC
Start: 2022-05-31 — End: 2022-09-12

## 2022-05-31 NOTE — Progress Notes (Signed)
Provider: Richarda Blade FNP-C  Sudais Banghart, Donalee Citrin, NP  Patient Care Team: Illene Sweeting, Donalee Citrin, NP as PCP - General (Family Medicine) Rennis Golden Lisette Abu, MD as PCP - Cardiology (Cardiology) Griselda Miner, MD as Consulting Physician (General Surgery) Coralyn Helling, MD as Consulting Physician (Pulmonary Disease) Lauris Poag, MD as Consulting Physician (Nephrology) Sherren Kerns, MD (Inactive) as Consulting Physician (Vascular Surgery) Chrystie Nose, MD as Consulting Physician (Cardiology) Rachael Fee, MD as Attending Physician (Gastroenterology) Camille Bal, MD as Consulting Physician (Nephrology) Clarene Duke Karma Lew, RN as [facility] Care Management  Extended Emergency Contact Information Primary Emergency Contact: Jasmine Awe Address: [address]          [place], [place] [address] D[place] of [place] Home Phone: [phone] Mobile Phone: [phone] Relation: Spouse  Code Status:  Full Code  Goals of care: Advanced Directive information    04/29/2022    3:01 AM  Advanced Directives  Would patient like information on creating a medical advance directive? No - Patient declined     Chief Complaint  Patient presents with   Acute Visit    Patient presents today for change in mood. Wife think it comes from the keppra.    HPI:  Pt is a 62 y.o. male seen today for an acute visit for evaluation of mood changes. States has been having mood swings and thinking of suicide.No plans to harm self or others.States whenever he watches TV or using the the phone he gets distracted which helps.  Feels like he has no control of himself at times.Angry about his condition though thinks he has gotten stronger but sometimes cannot do things how he used to.Not sleeping well at night.  Wife state patient was very agitated daughter had to call the police.Has never seen him this way since they have been married. On Cymbalta 30 mg daily.      Past Medical  History:  Diagnosis Date   Acute edema of lung, unspecified    Acute, but ill-defined, cerebrovascular disease    Allergy    Anemia    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)    Anxiety    Asthma    Asthma    moderate persistent   Carpal tunnel syndrome    Cellulitis and abscess of trunk    Cholelithiasis 07/13/2014   Chronic headaches    Debility, unspecified    Dermatophytosis of the body    Dysrhythmia    history of   Edema    End stage renal disease on dialysis [facility])    "MWF; [facility]" (10/21/2014)   Essential hypertension, benign    GERD (gastroesophageal reflux disease)    Gout, unspecified    HTN (hypertension)    Hypertrophy of prostate without urinary obstruction and other lower urinary tract symptoms (LUTS)    Hypotension, unspecified    Impotence of organic origin    Insomnia, unspecified    Kidney replaced by transplant    Localization-related (focal) (partial) epilepsy and epileptic syndromes with complex partial seizures, without mention of intractable epilepsy    12-15-19- Wife states he has NEVER had a seizure    Lumbago    Memory loss    OSA on CPAP    Other and unspecified hyperlipidemia    controlled /managed per wife    Other chronic nonalcoholic liver disease    Other malaise and fatigue    Other nonspecific abnormal serum enzyme levels    Pain in joint,  lower leg    Pain in joint, upper arm    Pneumonia "several times"   PONV (postoperative nausea and vomiting)    Renal dialysis status(V45.11) 02/05/2010   restarted 01/02/13 ofter renal trransplant failure   Secondary hyperparathyroidism (of renal origin)    Shortness of breath    Sleep apnea    wears cpap    Tension headache    Unspecified constipation    Unspecified essential hypertension    Unspecified hereditary and idiopathic peripheral neuropathy    Unspecified vitamin D deficiency    Past Surgical History:  Procedure Laterality Date   AV FISTULA PLACEMENT  Left ?2010   "forearm; at [facility]"   BACK SURGERY     CARDIAC CATHETERIZATION  03/21/2011   CHOLECYSTECTOMY N/A 10/21/2014   Procedure: LAPAROSCOPIC CHOLECYSTECTOMY WITH INTRAOPERATIVE CHOLANGIOGRAM;  Surgeon: Chevis Pretty III, MD;  Location: [facility] OR;  Service: General;  Laterality: N/A;   COLONOSCOPY     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   IR ANGIO INTRA EXTRACRAN SEL INTERNAL CAROTID UNI R MOD SED  11/30/2021   IR ANGIOGRAM FOLLOW UP STUDY  11/30/2021   IR BONE MARROW BIOPSY & ASPIRATION  03/27/2022   IR NEURO EACH ADD'L AFTER BASIC UNI RIGHT (MS)  11/30/2021   IR TRANSCATH/EMBOLIZ  11/30/2021   KIDNEY TRANSPLANT  08/17/2011   [facility]    LAPAROSCOPIC CHOLECYSTECTOMY  10/21/2014   w/IOC   LEFT HEART CATHETERIZATION WITH CORONARY ANGIOGRAM N/A 03/21/2011   Procedure: LEFT HEART CATHETERIZATION WITH CORONARY ANGIOGRAM;  Surgeon: Chrystie Nose, MD;  Location: [facility] CATH LAB;  Service: Cardiovascular;  Laterality: N/A;   NEPHRECTOMY  08/2013   removed transplaned kidney   POLYPECTOMY     POSTERIOR FUSION CERVICAL SPINE  06/25/2012   for spinal stenosis   RADIOLOGY WITH ANESTHESIA N/A 11/30/2021   Procedure: RIGHT MIDDLE MENINGEAL ARTERY EMBOLIZATION;  Surgeon: Radiologist, Medication, MD;  Location: [facility] OR;  Service: Radiology;  Laterality: N/A;   VASECTOMY  2010    Allergies  Allergen Reactions   Codeine Nausea And Vomiting   Hydrocodone-Acetaminophen Nausea And Vomiting    Outpatient Encounter Medications as of 05/31/2022  Medication Sig   acetaminophen (TYLENOL) 500 MG tablet Take 1 tablet (500 mg total) by mouth every 6 (six) hours as needed for moderate pain.   amLODipine (NORVASC) 10 MG tablet Take 1 tablet (10 mg total) by mouth every evening.   aspirin 81 MG chewable tablet Chew 81 mg by mouth daily.   benzonatate (TESSALON) 200 MG capsule Take 1 capsule (200 mg total) by mouth 3 (three) times daily as needed for cough.   budesonide-formoterol (SYMBICORT)  160-4.5 MCG/ACT inhaler Inhale 2 puffs into the lungs as needed.   calcitRIOL (ROCALTROL) 0.5 MCG capsule Take 2 capsules (1 mcg total) by mouth every Monday, Wednesday, and Friday with hemodialysis.   Cholecalciferol (VITAMIN D3) 25 MCG (1000 UT) CAPS Take 4,000 Units by mouth daily.   cinacalcet (SENSIPAR) 30 MG tablet Take 2 tablets (60 mg total) by mouth 3 (three) times a week.   dextromethorphan-guaiFENesin (MUCINEX DM) 30-600 MG 12hr tablet Take 1 tablet by mouth 2 (two) times daily.   diphenhydrAMINE (BENADRYL) 25 mg capsule Take 25 mg by mouth every 6 (six) hours as needed for allergies.   doxazosin (CARDURA) 8 MG tablet Take 1 tablet (8 mg total) by mouth daily.   DULoxetine (CYMBALTA) 30 MG capsule Take 1 capsule (30 mg total) by mouth daily.  FOSRENOL 1000 MG PACK Take 1 packet by mouth 3 (three) times daily.   hydrALAZINE (APRESOLINE) 100 MG tablet Take 1 tablet (100 mg total) by mouth 3 (three) times daily.   isosorbide mononitrate (IMDUR) 120 MG 24 hr tablet Take 1 tablet (120 mg total) by mouth daily.   levETIRAcetam (KEPPRA) 250 MG tablet TAKE 1 TABLET (250 MG TOTAL) BY MOUTH EVERY MONDAY, WEDNESDAY, AND FRIDAY WITH HEMODIALYSIS.   methocarbamol (ROBAXIN) 750 MG tablet Take 1 tablet (750 mg total) by mouth 4 (four) times daily.   metoprolol tartrate (LOPRESSOR) 100 MG tablet Take 1 tablet (100 mg total) by mouth 2 (two) times daily.   multivitamin (RENA-VIT) TABS tablet Take 1 tablet by mouth daily.   ondansetron (ZOFRAN-ODT) 4 MG disintegrating tablet TAKE 1 TABLET BY MOUTH EVERY 8 HOURS AS NEEDED FOR NAUSEA AND VOMITING   oxyCODONE (OXY IR/ROXICODONE) 5 MG immediate release tablet Take 1-2 tablets (5-10 mg total) by mouth every 4 (four) hours as needed for moderate pain.   pantoprazole (PROTONIX) 40 MG tablet Take 1 tablet (40 mg total) by mouth 2 (two) times daily before a meal.   pravastatin (PRAVACHOL) 40 MG tablet TAKE 1 TABLET BY MOUTH EVERY DAY IN THE EVENING   sevelamer  carbonate (RENVELA) 800 MG tablet Take 2 tablets (1,600 mg total) by mouth as needed (snacks).   sevelamer carbonate (RENVELA) 800 MG tablet Take 4 tablets (3,200 mg total) by mouth 3 (three) times daily with meals.   sucralfate (CARAFATE) 1 GM/10ML suspension Take 10 mLs (1 g total) by mouth 4 (four) times daily -  with meals and at bedtime.   traZODone (DESYREL) 50 MG tablet Take 0.5-1 tablets (25-50 mg total) by mouth at bedtime as needed. for sleep   valsartan (DIOVAN) 160 MG tablet Take 1 tablet (160 mg total) by mouth daily.   No facility-administered encounter medications on file as of 05/31/2022.    Review of Systems  Constitutional:  Negative for appetite change, chills, fatigue, fever and unexpected weight change.  HENT:  Negative for congestion, dental problem, ear discharge, ear pain, facial swelling, hearing loss, nosebleeds, postnasal drip, rhinorrhea, sinus pressure, sinus pain, sneezing, sore throat, tinnitus and trouble swallowing.   Eyes:  Negative for pain, discharge, redness, itching and visual disturbance.  Respiratory:  Negative for cough, chest tightness, shortness of breath and wheezing.   Cardiovascular:  Negative for chest pain, palpitations and leg swelling.  Gastrointestinal:  Negative for abdominal distention, abdominal pain, blood in stool, constipation, diarrhea, nausea and vomiting.  Endocrine: Negative for cold intolerance and heat intolerance.  Genitourinary:  Negative for flank pain and frequency.       On Hemodialysis M-W-F   Musculoskeletal:  Positive for arthralgias and back pain. Negative for gait problem, joint swelling, myalgias, neck pain and neck stiffness.  Skin:  Negative for color change, pallor, rash and wound.  Neurological:  Negative for dizziness, syncope, speech difficulty, weakness, light-headedness, numbness and headaches.  Hematological:  Does not bruise/bleed easily.  Psychiatric/Behavioral:  Positive for sleep disturbance. Negative for  agitation, behavioral problems, confusion, hallucinations, self-injury and suicidal ideas. The patient is nervous/anxious.        Worsening depression symptoms     Immunization History  Administered Date(s) Administered   Influenza Whole 11/01/2010   Influenza, Seasonal, Injecte, Preservative Fre 10/29/2012   Influenza-Unspecified 11/21/2011, 11/05/2012, 09/22/2013, 09/22/2017, 11/23/2019, 10/28/2020, 12/04/2020   Moderna Sars-Covid-2 Vaccination 04/06/2019, 05/07/2019, 03/31/2020   PPD Test 12/26/2012   Pneumococcal Polysaccharide-23 11/01/2010, 10/29/2012  Pneumococcal-Unspecified 11/05/2012   Tdap 01/22/2006, 06/08/2021   Pertinent  Health Maintenance Due  Topic Date Due   INFLUENZA VACCINE  08/23/2022   HEMOGLOBIN A1C  11/21/2022   COLONOSCOPY (Pts 45-60yrs Insurance coverage will need to be confirmed)  12/29/2026   FOOT EXAM  Discontinued   OPHTHALMOLOGY EXAM  Discontinued      11/30/2021   10:00 AM 11/30/2021    8:36 PM 12/01/2021    8:00 AM 12/07/2021   11:07 AM 02/28/2022    3:52 PM  Fall Risk  Falls in the past year?    0 1  Was there an injury with Fall?    0 1  Fall Risk Category Calculator    0 3  Fall Risk Category (Retired)    Low   (RETIRED) Patient Fall Risk Level High fall risk High fall risk High fall risk Low fall risk   Patient at Risk for Falls Due to    No Fall Risks No Fall Risks  Fall risk Follow up    Falls evaluation completed Falls evaluation completed   Functional Status Survey:    Vitals:   05/31/22 1045  BP: 132/72  Pulse: 61  Temp: (!) 96.2 F (35.7 C)  SpO2: 95%  Weight: 194 lb (88 kg)  Height: 5\' 7"  (1.702 m)   Body mass index is 30.38 kg/m. Physical Exam Vitals reviewed.  Constitutional:      General: He is not in acute distress.    Appearance: Normal appearance. He is obese. He is not ill-appearing or diaphoretic.  HENT:     Head: Normocephalic.     Nose: Nose normal. No congestion or rhinorrhea.     Mouth/Throat:      Mouth: Mucous membranes are moist.     Pharynx: Oropharynx is clear. No oropharyngeal exudate or posterior oropharyngeal erythema.  Eyes:     General: No scleral icterus.       Right eye: No discharge.        Left eye: No discharge.     Extraocular Movements: Extraocular movements intact.     Conjunctiva/sclera: Conjunctivae normal.     Pupils: Pupils are equal, round, and reactive to light.  Neck:     Vascular: No carotid bruit.  Cardiovascular:     Rate and Rhythm: Normal rate and regular rhythm.     Pulses: Normal pulses.     Heart sounds: Normal heart sounds. No murmur heard.    No friction rub. No gallop.  Pulmonary:     Effort: Pulmonary effort is normal. No respiratory distress.     Breath sounds: Normal breath sounds. No wheezing, rhonchi or rales.  Chest:     Chest wall: No tenderness.  Abdominal:     General: Bowel sounds are normal. There is no distension.     Palpations: Abdomen is soft. There is no mass.     Tenderness: There is no abdominal tenderness. There is no right CVA tenderness, left CVA tenderness, guarding or rebound.  Musculoskeletal:        General: No swelling or tenderness. Normal range of motion.     Cervical back: Normal range of motion. No rigidity or tenderness.     Right lower leg: No edema.     Left lower leg: No edema.  Lymphadenopathy:     Cervical: No cervical adenopathy.  Skin:    General: Skin is warm and dry.     Coloration: Skin is not pale.     Findings: No  bruising, erythema, lesion or rash.  Neurological:     Mental Status: He is alert and oriented to person, place, and time.     Motor: No weakness.     Gait: Gait normal.  Psychiatric:        Mood and Affect: Mood is depressed.        Speech: Speech normal.        Behavior: Behavior normal.        Thought Content: Thought content normal.        Judgment: Judgment normal.     Labs reviewed: Recent Labs    05/07/22 0327 05/08/22 0227 05/09/22 0224  NA 135 135 137  K 5.5*  5.8* 4.4  CL 96* 97* 97*  CO2 22 26 27   GLUCOSE 102* 215* 99  BUN 98* 66* 56*  CREATININE 12.34* 9.30* 7.52*  CALCIUM 9.7 9.1 9.3  MG 2.2 1.9 1.9  PHOS 4.6 4.8* 5.9*   Recent Labs    05/07/22 0327 05/08/22 0227 05/09/22 0224  AST 11* 17 12*  ALT 15 18 17   ALKPHOS 51 53 50  BILITOT 1.4* 1.1 0.7  PROT 5.9* 5.8* 6.0*  ALBUMIN 3.4* 3.2* 3.3*   Recent Labs    05/08/22 0227 05/09/22 0224 05/22/22 1409  WBC 4.9 8.0 3.0*  NEUTROABS 4.5 6.4 1,884  HGB 12.1* 12.3* 10.4*  HCT 37.3* 36.4* 32.3*  MCV 94.9 93.3 95.3  PLT 94* 103* 59*   Lab Results  Component Value Date   TSH 2.245 11/16/2021   Lab Results  Component Value Date   HGBA1C 5.3 05/22/2022   Lab Results  Component Value Date   CHOL 114 05/05/2022   HDL 41 05/05/2022   LDLCALC 56 05/05/2022   TRIG 86 05/05/2022   CHOLHDL 2.8 05/05/2022    Significant Diagnostic Results in last 30 days:  DG CHEST PORT 1 VIEW  Result Date: 05/05/2022 CLINICAL DATA:  200808 Hypoxia [id number] EXAM: PORTABLE CHEST - 1 VIEW COMPARISON:  04/28/2022 FINDINGS: The heart size and mediastinal contours are within normal limits. There is diffuse pulmonary interstitial prominence which could be seen with atypical infection, edema or interstitial lung disease. There is no focal consolidation. No pneumothorax or pleural effusion. Aorta is calcified. There are thoracic degenerative changes. IMPRESSION: Nonspecific interstitial prominence consistent with atypical infection, edema or interstitial lung disease. No focal consolidation. Electronically Signed   By: Layla Maw M.D.   On: 05/05/2022 09:34   CT CHEST WO CONTRAST  Result Date: 05/01/2022 CLINICAL DATA:  Respiratory illness EXAM: CT CHEST WITHOUT CONTRAST TECHNIQUE: Multidetector CT imaging of the chest was performed following the standard protocol without IV contrast. RADIATION DOSE REDUCTION: This exam was performed according to the departmental dose-optimization program which includes  automated exposure control, adjustment of the mA and/or kV according to patient size and/or use of iterative reconstruction technique. COMPARISON:  Chest CT dated August 01, 2021 FINDINGS: Cardiovascular: Cardiomegaly. Trace pericardial effusions. Normal caliber thoracic aorta with moderate atherosclerotic disease. Severe coronary artery calcifications. Mediastinum/Nodes: Esophagus and thyroid unremarkable. No pathologically enlarged nodes seen in the chest. Lungs/Pleura: Central airways are patent. Mucous plugging, mild consolidations and numerous small pulmonary nodules most pronounced in the lower lungs, slightly worsened when compared with the prior exam. Associated lower lobe dendriform pulmonary ossification, similar to prior. No pleural effusion or pneumothorax. Upper Abdomen: Cholecystectomy clips. 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 No aggressive appearing osseous lesions. IMPRESSION: 1. Mucous plugging, mild consolidations and numerous small pulmonary nodules  most pronounced in the lower lungs, slightly worsened when compared with the prior exam. Findings are likely due to infection or aspiration. 2. Severe coronary artery calcifications. 3. Aortic Atherosclerosis (ICD10-I70.0). Electronically Signed   By: Allegra Lai M.D.   On: 05/01/2022 13:02    Assessment/Plan 1. Generalized anxiety disorder Worsening symptoms  -Increase Cymbalta from 30 mg to 60 mg capsule daily -Discussed referral to psychiatrist and counseling services - DULoxetine (CYMBALTA) 60 MG capsule; Take 1 capsule (60 mg total) by mouth daily.  Dispense: 90 capsule; Refill: 1 - Ambulatory referral to Psychiatry  2. Major depression, recurrent, chronic (HCC) Symptoms are worsened with thoughts of suicide but no plan. Suicide prevention telephone number provided on AVS -Increase Cymbalta from 30 mg to 60 mg -Urgent referral placed to psychiatry service for follow-up -Wife advised to  call 911 or take him to emergency room if symptoms worsen - DULoxetine (CYMBALTA) 60 MG capsule; Take 1 capsule (60 mg total) by mouth daily.  Dispense: 90 capsule; Refill: 1 - Ambulatory referral to Psychiatry  Family/ staff Communication: Reviewed plan of care with patient and wife verbalized understanding  Labs/tests ordered: None   Next Appointment: Return if symptoms worsen or fail to improve.   Caesar Bookman, NP

## 2022-05-31 NOTE — Telephone Encounter (Signed)
LMOM to return call.

## 2022-06-01 ENCOUNTER — Telehealth: Payer: Self-pay

## 2022-06-01 ENCOUNTER — Telehealth: Payer: Self-pay | Admitting: Neurology

## 2022-06-01 DIAGNOSIS — D631 Anemia in chronic kidney disease: Secondary | ICD-10-CM | POA: Diagnosis not present

## 2022-06-01 DIAGNOSIS — R413 Other amnesia: Secondary | ICD-10-CM

## 2022-06-01 DIAGNOSIS — N186 End stage renal disease: Secondary | ICD-10-CM | POA: Diagnosis not present

## 2022-06-01 DIAGNOSIS — Z992 Dependence on renal dialysis: Secondary | ICD-10-CM | POA: Diagnosis not present

## 2022-06-01 DIAGNOSIS — N2581 Secondary hyperparathyroidism of renal origin: Secondary | ICD-10-CM | POA: Diagnosis not present

## 2022-06-01 NOTE — Telephone Encounter (Signed)
Neurology order placed as requested.
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
## 2022-06-01 NOTE — Telephone Encounter (Signed)
Patient spouse called and states that patient needs referral for memory issues to [facility]. Message routed to PCP Ngetich, Donalee Citrin, NP.

## 2022-06-04 ENCOUNTER — Telehealth: Payer: Self-pay | Admitting: *Deleted

## 2022-06-04 ENCOUNTER — Other Ambulatory Visit: Payer: Self-pay | Admitting: Neurology

## 2022-06-04 MED ORDER — BRIVIACT 50 MG PO TABS: 50.0000 mg | ORAL_TABLET | Freq: Two times a day (BID) | ORAL | 5 refills | Status: DC

## 2022-06-04 NOTE — Telephone Encounter (Signed)
Please advise spouse to stop Keppra and start a new mediation called Briviact, 50 mg twice a day. If Briviact is not approved by insurance, please let us know.

## 2022-06-04 NOTE — Telephone Encounter (Signed)
Called and relayed message to wife who was agreeable to plan and will let us know if they have issues getting the medication. Wife verbalized understanding. Wife had no questions at this time but was encouraged to call back if questions arise.

## 2022-06-04 NOTE — Progress Notes (Signed)
  Care Coordination Note  06/04/2022 Name: [name] MRN: [id number] DOB: 30-Apr-1960  Kirk [name] is a 62 y.o. year old male who is a primary care patient of Ngetich, Dinah C, NP and is actively engaged with the care management team. I reached out to Kirk [name] by phone today to assist with re-scheduling a follow up visit with the RN Case Manager  Follow up plan: Unsuccessful telephone outreach attempt made. A HIPAA compliant phone message was left for the patient providing contact information and requesting a return call.   [facility]  Care Coordination Care Guide  Direct Dial: [phone]
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
## 2022-06-04 NOTE — Telephone Encounter (Signed)
LMOM to return call.

## 2022-06-05 DIAGNOSIS — D631 Anemia in chronic kidney disease: Secondary | ICD-10-CM | POA: Diagnosis not present

## 2022-06-05 DIAGNOSIS — Z992 Dependence on renal dialysis: Secondary | ICD-10-CM | POA: Diagnosis not present

## 2022-06-05 DIAGNOSIS — N2581 Secondary hyperparathyroidism of renal origin: Secondary | ICD-10-CM | POA: Diagnosis not present

## 2022-06-05 DIAGNOSIS — N186 End stage renal disease: Secondary | ICD-10-CM | POA: Diagnosis not present

## 2022-06-05 NOTE — Progress Notes (Signed)
  Care Coordination Note  06/05/2022 Name: Kirk [name] MRN: [id number] DOB: May 13, 1960  Kirk [name] is a 62 y.o. year old male who is a primary care patient of Ngetich, Dinah C, NP and is actively engaged with the care management team. I reached out to Kirk [name] by phone today to assist with re-scheduling a follow up visit with the RN Case Manager  Follow up plan: Unsuccessful telephone outreach attempt made. A HIPAA compliant phone message was left for the patient providing contact information and requesting a return call.   [facility]  Care Coordination Care Guide  Direct Dial: [phone]

## 2022-06-06 DIAGNOSIS — D631 Anemia in chronic kidney disease: Secondary | ICD-10-CM | POA: Diagnosis not present

## 2022-06-06 DIAGNOSIS — Z992 Dependence on renal dialysis: Secondary | ICD-10-CM | POA: Diagnosis not present

## 2022-06-06 DIAGNOSIS — N2581 Secondary hyperparathyroidism of renal origin: Secondary | ICD-10-CM | POA: Diagnosis not present

## 2022-06-06 DIAGNOSIS — N186 End stage renal disease: Secondary | ICD-10-CM | POA: Diagnosis not present

## 2022-06-08 DIAGNOSIS — N186 End stage renal disease: Secondary | ICD-10-CM | POA: Diagnosis not present

## 2022-06-08 DIAGNOSIS — Z992 Dependence on renal dialysis: Secondary | ICD-10-CM | POA: Diagnosis not present

## 2022-06-08 DIAGNOSIS — D631 Anemia in chronic kidney disease: Secondary | ICD-10-CM | POA: Diagnosis not present

## 2022-06-08 DIAGNOSIS — N2581 Secondary hyperparathyroidism of renal origin: Secondary | ICD-10-CM | POA: Diagnosis not present

## 2022-06-08 NOTE — Progress Notes (Signed)
  Care Coordination Note  06/08/2022 Name: [name] MRN: [id number] DOB: 23-Jan-1960  Kirk [name] is a 62 y.o. year old male who is a primary care patient of Ngetich, Dinah C, NP and is actively engaged with the care management team. I reached out to Kirk [name] by phone today to assist with re-scheduling a follow up visit with the RN Case Manager  Follow up plan: Telephone appointment with care management team member scheduled for:6/4  [facility] Coordination Care Guide  Direct Dial: [phone]

## 2022-06-11 DIAGNOSIS — N2581 Secondary hyperparathyroidism of renal origin: Secondary | ICD-10-CM | POA: Diagnosis not present

## 2022-06-11 DIAGNOSIS — N186 End stage renal disease: Secondary | ICD-10-CM | POA: Diagnosis not present

## 2022-06-11 DIAGNOSIS — Z992 Dependence on renal dialysis: Secondary | ICD-10-CM | POA: Diagnosis not present

## 2022-06-11 DIAGNOSIS — D631 Anemia in chronic kidney disease: Secondary | ICD-10-CM | POA: Diagnosis not present

## 2022-06-13 DIAGNOSIS — N2581 Secondary hyperparathyroidism of renal origin: Secondary | ICD-10-CM | POA: Diagnosis not present

## 2022-06-13 DIAGNOSIS — N186 End stage renal disease: Secondary | ICD-10-CM | POA: Diagnosis not present

## 2022-06-13 DIAGNOSIS — Z992 Dependence on renal dialysis: Secondary | ICD-10-CM | POA: Diagnosis not present

## 2022-06-13 DIAGNOSIS — D631 Anemia in chronic kidney disease: Secondary | ICD-10-CM | POA: Diagnosis not present

## 2022-06-15 ENCOUNTER — Other Ambulatory Visit: Payer: Self-pay | Admitting: Family

## 2022-06-15 DIAGNOSIS — N186 End stage renal disease: Secondary | ICD-10-CM | POA: Diagnosis not present

## 2022-06-15 DIAGNOSIS — D631 Anemia in chronic kidney disease: Secondary | ICD-10-CM | POA: Diagnosis not present

## 2022-06-15 DIAGNOSIS — N2581 Secondary hyperparathyroidism of renal origin: Secondary | ICD-10-CM | POA: Diagnosis not present

## 2022-06-15 DIAGNOSIS — Z992 Dependence on renal dialysis: Secondary | ICD-10-CM | POA: Diagnosis not present

## 2022-06-18 DIAGNOSIS — Z992 Dependence on renal dialysis: Secondary | ICD-10-CM | POA: Diagnosis not present

## 2022-06-18 DIAGNOSIS — N2581 Secondary hyperparathyroidism of renal origin: Secondary | ICD-10-CM | POA: Diagnosis not present

## 2022-06-18 DIAGNOSIS — D631 Anemia in chronic kidney disease: Secondary | ICD-10-CM | POA: Diagnosis not present

## 2022-06-18 DIAGNOSIS — N186 End stage renal disease: Secondary | ICD-10-CM | POA: Diagnosis not present

## 2022-06-20 DIAGNOSIS — D631 Anemia in chronic kidney disease: Secondary | ICD-10-CM | POA: Diagnosis not present

## 2022-06-20 DIAGNOSIS — N2581 Secondary hyperparathyroidism of renal origin: Secondary | ICD-10-CM | POA: Diagnosis not present

## 2022-06-20 DIAGNOSIS — Z992 Dependence on renal dialysis: Secondary | ICD-10-CM | POA: Diagnosis not present

## 2022-06-20 DIAGNOSIS — N186 End stage renal disease: Secondary | ICD-10-CM | POA: Diagnosis not present

## 2022-06-22 DIAGNOSIS — Z992 Dependence on renal dialysis: Secondary | ICD-10-CM | POA: Diagnosis not present

## 2022-06-22 DIAGNOSIS — D631 Anemia in chronic kidney disease: Secondary | ICD-10-CM | POA: Diagnosis not present

## 2022-06-22 DIAGNOSIS — N2581 Secondary hyperparathyroidism of renal origin: Secondary | ICD-10-CM | POA: Diagnosis not present

## 2022-06-22 DIAGNOSIS — N186 End stage renal disease: Secondary | ICD-10-CM | POA: Diagnosis not present

## 2022-06-23 DIAGNOSIS — Z992 Dependence on renal dialysis: Secondary | ICD-10-CM | POA: Diagnosis not present

## 2022-06-23 DIAGNOSIS — T8612 Kidney transplant failure: Secondary | ICD-10-CM | POA: Diagnosis not present

## 2022-06-23 DIAGNOSIS — N186 End stage renal disease: Secondary | ICD-10-CM | POA: Diagnosis not present

## 2022-06-25 DIAGNOSIS — D509 Iron deficiency anemia, unspecified: Secondary | ICD-10-CM | POA: Diagnosis not present

## 2022-06-25 DIAGNOSIS — N186 End stage renal disease: Secondary | ICD-10-CM | POA: Diagnosis not present

## 2022-06-25 DIAGNOSIS — D631 Anemia in chronic kidney disease: Secondary | ICD-10-CM | POA: Diagnosis not present

## 2022-06-25 DIAGNOSIS — N2581 Secondary hyperparathyroidism of renal origin: Secondary | ICD-10-CM | POA: Diagnosis not present

## 2022-06-25 DIAGNOSIS — Z992 Dependence on renal dialysis: Secondary | ICD-10-CM | POA: Diagnosis not present

## 2022-06-26 ENCOUNTER — Telehealth: Payer: Self-pay | Admitting: Neurology

## 2022-06-26 ENCOUNTER — Ambulatory Visit: Payer: Self-pay

## 2022-06-26 NOTE — Telephone Encounter (Signed)
Call to wife, advised that Dr. Teresa Coombs stated it was fine to try and crush or half tablet  and give it in ice cream. Wife stated she would try but wasn't sure it would work. Advised to keep [place] posted. Wife verbalized understanding and appreciative of call

## 2022-06-26 NOTE — Telephone Encounter (Signed)
Lawanna Kobus RN nurse called from [facility]. Stated she is working with pt through [employer]. Stated she needs to talk to nurse about pt seizure medication. Stated pt has stopped taking medication because he doesn't like size effect.

## 2022-06-26 NOTE — Patient Outreach (Signed)
  Care Coordination   Follow Up Visit Note   06/26/2022 Name: Kirk [name] MRN: [id number] DOB: 17-Jun-1960  Kirk [name] is a 62 y.o. year old male who sees Kirk [name], Kirk C, NP for primary care. I spoke with  Kirk [name] by phone today.  What matters to the patients health and wellness today?  Patient's wife is concerned about his change in mood and the risk for Seizures.     Goals Addressed             This Visit's Progress    To follow up with Neurology concerning SE to seizure meds       Care Coordination Interventions: Evaluation of current treatment plan related to Seizures and patient's adherence to plan as established by provider Reviewed and discussed with wife Kirk [name], patient experienced a recent SE to Keppra as evidence by, patient experienced a sudden change in mood such as increased agitation and stated to his wife, "I just want to die" Determined wife notified Dr. Teresa Coombs, Neurologist who switched patient's medication to Briviact, 50 mg twice a day, per wife patient took one dose of this medication but experienced severe fatigue and weakness, he identified with feeling, "zombie like" Determined patient threw the remaining supply of Briviact away and refuses to take additional doses Educated wife on increased risk of Seizure activity, educated on how to help patient during a Seizure and when to activate EMS, and wife verbalizes understanding Placed outbound call to Dr. Karie Georges office, spoke with Cinda Quest, CMA advising of the above reported by patient's wife Kirk [name], discussed Cinda Quest will inform Dr. Teresa Coombs and will contact the patient's wife to advise of recommendations      To keep BP stable       Care Coordination Interventions: Completed successful outbound call to wife Kirk [name] Evaluation of current treatment plan related to hypertension self management and patient's adherence to plan as established by provider Reviewed and discussed recent hospital admission related  to hypertension; Reviewed and discussed PCP post hospital follow up has been completed   Reviewed medications with patient and discussed importance of compliance Advised patient, providing education and rationale, to monitor blood pressure daily and record, calling PCP for findings outside established parameters Confirmed Kirk [name] is monitoring patient's BP at home, she reports BP "have improved", although patient continues to experience a few elevated readings Instructed wife to keep patient's doctor well informed of new symptoms or concerns    Interventions Today    Flowsheet Row Most Recent Value  Chronic Disease   Chronic disease during today's visit Other  [Seizures, chronic pain]  General Interventions   General Interventions Discussed/Reviewed General Interventions Discussed, General Interventions Reviewed, Doctor Visits, Communication with  Doctor Visits Discussed/Reviewed Doctor Visits Discussed, Doctor Visits Reviewed, Specialist  Communication with PCP/Specialists  [Dr. San Morelle, Neurologist]  Education Interventions   Education Provided Provided Education  Provided Verbal Education On Mental Health/Coping with Illness, Medication, When to see the doctor  Mental Health Interventions   Mental Health Discussed/Reviewed Mental Health Discussed, Mental Health Reviewed, Coping Strategies, Anxiety  Pharmacy Interventions   Pharmacy Dicussed/Reviewed Pharmacy Topics Discussed, Pharmacy Topics Reviewed, Medications and their functions          SDOH assessments and interventions completed:  No     Care Coordination Interventions:  Yes, provided   Follow up plan: Follow up call scheduled for 07/10/22 @11 :00 AM    Encounter Outcome:  Pt. Visit Completed

## 2022-06-26 NOTE — Telephone Encounter (Signed)
Called and spoke to [place] who reports that she spoke with wife and patient on the phone. The nurse reports that since we recommended him to stop the keppra and start brivact on 05.13 (see phone) patient stopped the keppra and went without medication according to angel until this past Friday 06.01.24, when he took his first dose of brivact. He reported feeling like a zombie all weekend so he stopped that medication. Patient has been without medications due to the side effects now for weeks. Wife was unable to give a straight answer as to why they did not reach out or start medication sooner. Please advise on next steps.

## 2022-06-26 NOTE — Patient Instructions (Signed)
Visit Information  Thank you for taking time to visit with me today. Please don't hesitate to contact me if I can be of assistance to you.   Following are the goals we discussed today:   Goals Addressed             This Visit's Progress    To follow up with Neurology concerning SE to seizure meds       Care Coordination Interventions: Evaluation of current treatment plan related to Seizures and patient's adherence to plan as established by provider Reviewed and discussed with wife Chyrl Civatte, patient experienced a recent SE to Keppra as evidence by, patient experienced a sudden change in mood such as increased agitation and stated to his wife, "I just want to die" Determined wife notified Dr. Teresa Coombs, Neurologist who switched patient's medication to Briviact, 50 mg twice a day, per wife patient took one dose of this medication but experienced severe fatigue and weakness, he identified with feeling, "zombie like" Determined patient threw the remaining supply of Briviact away and refuses to take additional doses Educated wife on increased risk of Seizure activity, educated on how to help patient during a Seizure and when to activate EMS, and wife verbalizes understanding Placed outbound call to Dr. Karie Georges office, spoke with Cinda Quest, CMA advising of the above reported by patient's wife Chyrl Civatte, discussed Cinda Quest will inform Dr. Teresa Coombs and will contact the patient's wife to advise of recommendations        To keep BP stable       Care Coordination Interventions: Completed successful outbound call to wife [name] Evaluation of current treatment plan related to hypertension self management and patient's adherence to plan as established by provider Reviewed and discussed recent hospital admission related to hypertension; Reviewed and discussed PCP post hospital follow up has been completed   Reviewed medications with patient and discussed importance of compliance Advised patient, providing education  and rationale, to monitor blood pressure daily and record, calling PCP for findings outside established parameters Confirmed [name] is monitoring patient's BP at home, she reports BP "have improved", although patient continues to experience a few elevated readings Instructed wife to keep patient's doctor well informed of new symptoms or concerns          Our next appointment is by telephone on 07/10/22 at 11:00 AM  Please call the care guide team at [phone] if you need to cancel or reschedule your appointment.   If you are experiencing a Mental Health or Behavioral Health Crisis or need someone to talk to, please call 1-800-273-TALK (toll free, 24 hour hotline)  Patient verbalizes understanding of instructions and care plan provided today and agrees to view in MyChart. Active MyChart status and patient understanding of how to access instructions and care plan via MyChart confirmed with patient.     Delsa Sale, RN, BSN, CCM Care Management Coordinator [facility] Care Management  Direct Phone: [phone]

## 2022-06-26 NOTE — Telephone Encounter (Signed)
Please advised them to restart the Briviact, 1/2 tablet twice daily for 2 weeks then increase to full tablet.

## 2022-06-26 NOTE — Telephone Encounter (Signed)
Called and spoke to pt wife and relayed: Please advised them to restart the Briviact, 1/2 tablet twice daily for 2 weeks then increase to full tablet.   Pt wife stated that she is unsure if she can get him to take it or not. Could he possibly crush the tablet and put it in ice cream since she is having trouble getting him to take it?

## 2022-06-26 NOTE — Telephone Encounter (Signed)
Yes, she can try that 

## 2022-06-27 DIAGNOSIS — Z992 Dependence on renal dialysis: Secondary | ICD-10-CM | POA: Diagnosis not present

## 2022-06-27 DIAGNOSIS — D509 Iron deficiency anemia, unspecified: Secondary | ICD-10-CM | POA: Diagnosis not present

## 2022-06-27 DIAGNOSIS — N186 End stage renal disease: Secondary | ICD-10-CM | POA: Diagnosis not present

## 2022-06-27 DIAGNOSIS — D631 Anemia in chronic kidney disease: Secondary | ICD-10-CM | POA: Diagnosis not present

## 2022-06-27 DIAGNOSIS — N2581 Secondary hyperparathyroidism of renal origin: Secondary | ICD-10-CM | POA: Diagnosis not present

## 2022-06-28 ENCOUNTER — Encounter (HOSPITAL_BASED_OUTPATIENT_CLINIC_OR_DEPARTMENT_OTHER): Payer: Self-pay | Admitting: Family

## 2022-06-28 ENCOUNTER — Ambulatory Visit (INDEPENDENT_AMBULATORY_CARE_PROVIDER_SITE_OTHER): Payer: Medicare Other | Admitting: Family

## 2022-06-28 VITALS — BP 164/68 | HR 60 | Ht 67.0 in | Wt 192.0 lb

## 2022-06-28 DIAGNOSIS — I10 Essential (primary) hypertension: Secondary | ICD-10-CM | POA: Diagnosis not present

## 2022-06-28 DIAGNOSIS — I428 Other cardiomyopathies: Secondary | ICD-10-CM | POA: Diagnosis not present

## 2022-06-28 DIAGNOSIS — N186 End stage renal disease: Secondary | ICD-10-CM

## 2022-06-28 DIAGNOSIS — I350 Nonrheumatic aortic (valve) stenosis: Secondary | ICD-10-CM

## 2022-06-28 MED ORDER — ISOSORBIDE MONONITRATE ER 120 MG PO TB24
120.0000 mg | ORAL_TABLET | Freq: Every day | ORAL | 3 refills | Status: DC
Start: 2022-06-28 — End: 2023-03-16

## 2022-06-28 NOTE — Progress Notes (Signed)
Office Visit    Patient Name: Kirk [name] Date of Encounter: 06/28/2022  PCP:  Caesar Bookman, NP   [facility]  Cardiologist:  Chrystie Nose, MD  Advanced Practice Provider:  No care team member to display Electrophysiologist:  None  Nephrologist:    Chief Complaint    Kirk [name] is a 62 y.o. male  presents today for hypertension.  Past Medical History    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)    Anxiety    Asthma    Asthma    moderate persistent   Carpal tunnel syndrome    Cellulitis and abscess of trunk    Cholelithiasis 07/13/2014   Chronic headaches    Debility, unspecified    Dermatophytosis of the body    Dysrhythmia    history of   Edema    End stage renal disease on dialysis [facility])    "MWF; [facility]" (10/21/2014)   Essential hypertension, benign    GERD (gastroesophageal reflux disease)    Gout, unspecified    HTN (hypertension)    Hypertrophy of prostate without urinary obstruction and other lower urinary tract symptoms (LUTS)    Hypotension, unspecified    Impotence of organic origin    Insomnia, unspecified    Kidney replaced by transplant    Localization-related (focal) (partial) epilepsy and epileptic syndromes with complex partial seizures, without mention of intractable epilepsy    12-15-19- Wife states he has NEVER had a seizure    Lumbago    Memory loss    OSA on CPAP    Other and unspecified hyperlipidemia    controlled /managed per wife    Other chronic nonalcoholic liver disease    Other malaise and fatigue    Other nonspecific abnormal serum enzyme levels    Pain in joint, lower leg    Pain in joint, upper arm    Pneumonia "several times"   PONV (postoperative nausea and vomiting)    Renal dialysis status(V45.11) 02/05/2010    restarted 01/02/13 ofter renal trransplant failure   Secondary hyperparathyroidism (of renal origin)    Shortness of breath    Sleep apnea    wears cpap    Tension headache    Unspecified constipation    Unspecified essential hypertension    Unspecified hereditary and idiopathic peripheral neuropathy    Unspecified vitamin D deficiency    Past Surgical History:  Procedure Laterality Date   AV FISTULA PLACEMENT Left ?2010   "forearm; at [facility]"   BACK SURGERY     CARDIAC CATHETERIZATION  03/21/2011   CHOLECYSTECTOMY N/A 10/21/2014   Procedure: LAPAROSCOPIC CHOLECYSTECTOMY WITH INTRAOPERATIVE CHOLANGIOGRAM;  Surgeon: Chevis Pretty III, MD;  Location: [facility] OR;  Service: General;  Laterality: N/A;   COLONOSCOPY     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   IR ANGIO INTRA EXTRACRAN SEL INTERNAL CAROTID UNI R MOD SED  11/30/2021   IR ANGIOGRAM FOLLOW UP STUDY  11/30/2021   IR BONE MARROW BIOPSY & ASPIRATION  03/27/2022   IR NEURO EACH ADD'L AFTER BASIC UNI RIGHT (MS)  11/30/2021   IR TRANSCATH/EMBOLIZ  11/30/2021   KIDNEY TRANSPLANT  08/17/2011   [facility]    LAPAROSCOPIC CHOLECYSTECTOMY  10/21/2014   w/IOC  LEFT HEART CATHETERIZATION WITH CORONARY ANGIOGRAM N/A 03/21/2011   Procedure: LEFT HEART CATHETERIZATION WITH CORONARY ANGIOGRAM;  Surgeon: Chrystie Nose, MD;  Location: [facility] CATH LAB;  Service: Cardiovascular;  Laterality: N/A;   NEPHRECTOMY  08/2013   removed transplaned kidney   POLYPECTOMY     POSTERIOR FUSION CERVICAL SPINE  06/25/2012   for spinal stenosis   RADIOLOGY WITH ANESTHESIA N/A 11/30/2021   Procedure: RIGHT MIDDLE MENINGEAL ARTERY EMBOLIZATION;  Surgeon: Radiologist, Medication, MD;  Location: [facility] OR;  Service: Radiology;  Laterality: N/A;   VASECTOMY  2010    Allergies  Allergies  Allergen Reactions   Codeine Nausea And Vomiting   Hydrocodone-Acetaminophen Nausea And Vomiting    History of Present Illness    Kirk [name] is a 62  y.o. 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  He had chest pain at the time associated with dialysis.  Echocardiogram August 2018 mild LVH, LVEF 55 to 60%.  CT angiography performed January 2020 for sharp chest pain with no PE though did show bronchitis and small airway bronchiolitis.  He was treated with antibiotics.  He did have atrial fibrillation at the time though given CHA2DS2-VASc score of 1 he was not recommended for anticoagulation.  Echocardiogram 01/2018 LVEF 55% with no regional wall motion abnormalities.  Seen 08/2020 for preoperative clearance for cervical spine surgery which was provided. Saw Dr. Rennis Golden noting chest pain much improved with HD. No changes recommended.   Admitted 10/17-10/31/23 with new onset seizure at home 2 minutes. CT head with subdural hematoma. Neurology and neurosurgery consulted. Keppra transitioned to Depakote but was transitioned back to keppra. Echo LVEF 50-55%, no RWMA, gr1DD, mild AS (mean gradient ). 11/14/21 developed right sided gaze and left neglect with CT head no stroke, not candidate for TPA. Felt to be related to seizure activity and resolved. MRI 11/16/21 suggested rebleeding, no surgical intervention recommended. BP noted to be difficult to control and Imdur, Hydralazine doses increased while hospitalized. He was treated with IV abx for bacteremia concern for CIRS. He did have PAF 11/15/21 which converted with dose of Cardizem (had not had Metoprolol in 36h due to mental status) and converted to NSR within one hour. Discharged to inpatient rehab. He was readmitted and required right middle meningeal artery embolization 11/30/21.   At visit 04/12/22 with BP in clinic 204/92 with home readings 160-180. Isosorbide was increased to 60mg  BID. PRN Clonidine  was refilled. However at follow up 04/24/22 noted headache and Imdur reduced to 60mg . Doxazosin increased to 8mg  QHS and Losartan transitioned to Valsartan.   Admitted 4/6-4/17/24 with hypertensive crisis, rhinovirus pneumonia, hypoxic respiratory failure. Hew as discharged on Amlodipine 10mg  QD, Doxazosin 8mg  QD, Hydralazine 100mg  TID, Irbesartan 150mg  QD, Toprol 100mg  BID, Imdur 120mg  QD. BP during hospitalization 100s-150s/60s  Presents today for follow up with his wife. Bp at home most often 130s-140s. BP at HD remains labile. He has persistent difficulties with sleep but does try to wear CPAP. Predominantly bothered at night by neuropathy related pain.  Still with headaches and not certain they were better on reduced dose Imdur. Reports no chest pain and no new dyspnea.   EKGs/Labs/Other Studies Reviewed:   The following studies were reviewed today:  Cardiac Studies & Procedures     STRESS TESTS  MYOCARDIAL PERFUSION IMAGING  09/02/2018  Narrative  Nuclear stress EF: 38%.  The left ventricular ejection fraction is moderately decreased (30-44%).  There was no ST segment deviation noted during stress.  This is an intermediate risk study.  1. EF 38%, diffuse hypokinesis. 2. Fixed small, mild mid to apical inferior perfusion defect.  I suspect that this is more likely diaphragmatic attenuation then prior infarction.  No evidence for ischemia.  Intermediate risk study due to low EF.  Suspect nonischemic cardiomyopathy.   ECHOCARDIOGRAM  ECHOCARDIOGRAM COMPLETE 11/16/2021  Narrative ECHOCARDIOGRAM REPORT    Patient Name:   Kirk [name] Date of Exam: 11/16/2021 Medical Rec #:  [id number]       Height:       67.0 in Accession #:    [id number]      Weight:       200.0 lb Date of Birth:  1960/06/26        BSA:          2.022 m Patient Age:    61 years        BP:           175/70 mmHg Patient Gender: M               HR:           61 bpm. Exam Location:  Inpatient  Procedure: 2D  Echo, Color Doppler and Cardiac Doppler  Indications:    I48.91* Unspecified atrial fibrillation  History:        Patient has prior history of Echocardiogram examinations, most recent 09/08/2020. Risk Factors:Hypertension, Diabetes, Dyslipidemia and Sleep Apnea.  Sonographer:    Irving Burton Senior RDCS Referring Phys: [id number] Osvaldo Shipper  IMPRESSIONS   1. Left ventricular ejection fraction, by estimation, is 50 to 55%. The left ventricle has low normal function. The left ventricle has no regional wall motion abnormalities. Left ventricular diastolic parameters are consistent with Grade I diastolic dysfunction (impaired relaxation). 2. Right ventricular systolic function is normal. The right ventricular size is normal. Tricuspid regurgitation signal is inadequate for assessing PA pressure. 3. Left atrial size was mildly dilated. 4. The mitral valve is normal in structure. Trivial mitral valve regurgitation. No evidence of mitral stenosis. 5. The aortic valve is normal in structure. Aortic valve regurgitation is not visualized. Mild aortic valve stenosis. Aortic valve area, by VTI measures 1.69 cm. Aortic valve mean gradient measures 8.0 mmHg. Aortic valve Vmax measures 1.92 m/s. 6. The inferior vena cava is normal in size with greater than 50% respiratory variability, suggesting right atrial pressure of 3 mmHg.  FINDINGS Left Ventricle: Left ventricular ejection fraction, by estimation, is 50 to 55%. The left ventricle has low normal function. The left ventricle has no regional wall motion abnormalities. The left ventricular internal cavity size was normal in size. There is no left ventricular hypertrophy. Left ventricular diastolic parameters are consistent with Grade I diastolic dysfunction (impaired relaxation).  Right Ventricle: The right ventricular size is normal. No increase in right ventricular wall thickness. Right ventricular systolic function is normal. Tricuspid regurgitation signal is  inadequate for assessing PA pressure.  Left Atrium: Left atrial size was mildly dilated.  Right Atrium: Right atrial size was normal in size.  Pericardium: There is no evidence of pericardial effusion. Presence of epicardial fat layer.  Mitral Valve: The mitral valve is normal in structure. Trivial mitral valve regurgitation. No evidence of mitral valve stenosis.  Tricuspid Valve: The tricuspid valve is normal in structure. Tricuspid valve regurgitation is not demonstrated.  No evidence of tricuspid stenosis.  Aortic Valve: The aortic valve is normal in structure. Aortic valve regurgitation is not visualized. Mild aortic stenosis is present. Aortic valve mean gradient measures 8.0 mmHg. Aortic valve peak gradient measures 14.7 mmHg. Aortic valve area, by VTI measures 1.69 cm.  Pulmonic Valve: The pulmonic valve was normal in structure. Pulmonic valve regurgitation is not visualized. No evidence of pulmonic stenosis.  Aorta: The aortic root is normal in size and structure.  Venous: The inferior vena cava is normal in size with greater than 50% respiratory variability, suggesting right atrial pressure of 3 mmHg.  IAS/Shunts: No atrial level shunt detected by color flow Doppler.   LEFT VENTRICLE PLAX 2D LVIDd:         5.70 cm   Diastology LVIDs:         4.10 cm   LV e' medial:    6.42 cm/s LV PW:         1.20 cm   LV E/e' medial:  19.0 LV IVS:        0.90 cm   LV e' lateral:   7.07 cm/s LVOT diam:     2.20 cm   LV E/e' lateral: 17.3 LV SV:         76 LV SV Index:   38 LVOT Area:     3.80 cm   RIGHT VENTRICLE RV S prime:     13.40 cm/s TAPSE (M-mode): 2.4 cm  LEFT ATRIUM             Index        RIGHT ATRIUM           Index LA diam:        4.40 cm 2.18 cm/m   RA Area:     18.30 cm LA Vol (A2C):   72.9 ml 36.03 ml/m  RA Volume:   50.20 ml  24.83 ml/m LA Vol (A4C):   63.6 ml 31.45 ml/m LA Biplane Vol: 69.9 ml 34.57 ml/m AORTIC VALVE AV Area (Vmax):    1.69 cm AV Area  (Vmean):   1.89 cm AV Area (VTI):     1.69 cm AV Vmax:           192.00 cm/s AV Vmean:          133.000 cm/s AV VTI:            0.452 m AV Peak Grad:      14.7 mmHg AV Mean Grad:      8.0 mmHg LVOT Vmax:         85.40 cm/s LVOT Vmean:        66.000 cm/s LVOT VTI:          0.201 m LVOT/AV VTI ratio: 0.44  AORTA Ao Root diam: 3.10 cm Ao Asc diam:  3.50 cm  MITRAL VALVE MV Area (PHT): 3.61 cm     SHUNTS MV Decel Time: 210 msec     Systemic VTI:  0.20 m MV E velocity: 122.00 cm/s  Systemic Diam: 2.20 cm MV A velocity: 95.40 cm/s MV E/A ratio:  1.28  Kardie Tobb DO Electronically signed by Thomasene Ripple DO Signature Date/Time: 11/16/2021/4:05:41 PM    Final             EKG:  EKG is not ordered today.    Recent Labs: 11/16/2021: TSH 2.245 05/09/2022: ALT 17; BUN 56; Creatinine, Ser 7.52; Magnesium 1.9; Potassium 4.4; Sodium 137 05/22/2022: Hemoglobin 10.4; Platelets 59  Recent Lipid  Panel    Component Value Date/Time   CHOL 114 05/05/2022 0150   CHOL 195 06/29/2015 1533   TRIG 86 05/05/2022 0150   HDL 41 05/05/2022 0150   HDL 17 (L) 06/29/2015 1533   CHOLHDL 2.8 05/05/2022 0150   VLDL 17 05/05/2022 0150   LDLCALC 56 05/05/2022 0150   LDLCALC 42 05/25/2021 0814   Home Medications   Current Meds  Medication Sig   acetaminophen (TYLENOL) 500 MG tablet Take 1 tablet (500 mg total) by mouth every 6 (six) hours as needed for moderate pain.   amLODipine (NORVASC) 10 MG tablet Take 1 tablet (10 mg total) by mouth every evening.   aspirin 81 MG chewable tablet Chew 81 mg by mouth daily.   budesonide-formoterol (SYMBICORT) 160-4.5 MCG/ACT inhaler Inhale 2 puffs into the lungs as needed.   calcitRIOL (ROCALTROL) 0.5 MCG capsule Take 2 capsules (1 mcg total) by mouth every Monday, Wednesday, and Friday with hemodialysis.   Cholecalciferol (VITAMIN D3) 25 MCG (1000 UT) CAPS Take 4,000 Units by mouth daily.   cinacalcet (SENSIPAR) 30 MG tablet Take 2 tablets (60 mg total) by  mouth 3 (three) times a week.   dextromethorphan-guaiFENesin (MUCINEX DM) 30-600 MG 12hr tablet Take 1 tablet by mouth 2 (two) times daily.   diphenhydrAMINE (BENADRYL) 25 mg capsule Take 25 mg by mouth every 6 (six) hours as needed for allergies.   doxazosin (CARDURA) 8 MG tablet Take 1 tablet (8 mg total) by mouth daily.   DULoxetine (CYMBALTA) 60 MG capsule Take 1 capsule (60 mg total) by mouth daily.   FOSRENOL 1000 MG PACK Take 1 packet by mouth 3 (three) times daily.   hydrALAZINE (APRESOLINE) 100 MG tablet Take 1 tablet (100 mg total) by mouth 3 (three) times daily.   methocarbamol (ROBAXIN) 750 MG tablet Take 1 tablet (750 mg total) by mouth 4 (four) times daily.   metoprolol tartrate (LOPRESSOR) 100 MG tablet Take 1 tablet (100 mg total) by mouth 2 (two) times daily.   multivitamin (RENA-VIT) TABS tablet Take 1 tablet by mouth daily.   ondansetron (ZOFRAN-ODT) 4 MG disintegrating tablet TAKE 1 TABLET BY MOUTH EVERY 8 HOURS AS NEEDED FOR NAUSEA AND VOMITING   pantoprazole (PROTONIX) 40 MG tablet Take 1 tablet (40 mg total) by mouth 2 (two) times daily before a meal.   pravastatin (PRAVACHOL) 40 MG tablet TAKE 1 TABLET BY MOUTH EVERY DAY IN THE EVENING   sevelamer carbonate (RENVELA) 800 MG tablet Take 2 tablets (1,600 mg total) by mouth as needed (snacks).   sevelamer carbonate (RENVELA) 800 MG tablet Take 4 tablets (3,200 mg total) by mouth 3 (three) times daily with meals.   sucralfate (CARAFATE) 1 GM/10ML suspension Take 10 mLs (1 g total) by mouth 4 (four) times daily -  with meals and at bedtime.   traZODone (DESYREL) 50 MG tablet Take 0.5-1 tablets (25-50 mg total) by mouth at bedtime as needed. for sleep   [DISCONTINUED] isosorbide mononitrate (IMDUR) 120 MG 24 hr tablet Take 1 tablet (120 mg total) by mouth daily.     Review of Systems      All other systems reviewed and are otherwise negative except as noted above.  Physical Exam    VS:  BP (!) 164/68   Pulse 60   Ht 5'  7" (1.702 m)   Wt 192 lb (87.1 kg)   BMI 30.07 kg/m  , BMI Body mass index is 30.07 kg/m.  Wt Readings from Last 3 Encounters:  06/28/22 192 lb (87.1 kg)  05/31/22 194 lb (88 kg)  05/22/22 187 lb (84.8 kg)    GEN: Well nourished, well developed, in no acute distress. HEENT: normal. Neck: Supple, no JVD, carotid bruits, or masses. Cardiac: RRR, no murmurs, rubs, or gallops. No clubbing, cyanosis, edema.  Radials/PT 2+ and equal bilaterally.  Respiratory:  Respirations regular and unlabored, clear to auscultation bilaterally. GI: Soft, nontender, nondistended. MS: No deformity or atrophy. Skin: Warm and dry, no rash. Neuro:  Strength and sensation are intact. Psych: Normal affect.  Assessment & Plan    HTN - BP elevated in clinic but has not yet taken afternoon dose of Hydralazine and home SBP 130-140s. Labile readings at HD are unchanged but not interrupting therapy. Continue present anithypertensive regimen Amlodipine 10mg  QD, Doxazosin 8mg  QHS, Hydralazine 100mg  TID, Imdur 120mg  QD, Metoprolol Tartrate 100mg  BID. Imdur refill provided. Discussed to monitor BP at home at least 2 hours after medications and sitting for 5-10 minutes. Previously noted fatigue with CLonidine but did tolerate PRN if needed in the future.   Subdural hemorrhage - Admission 10/2021. Follows with neurology.   ESRD on HD - s/p failed renal transplant. volume management per HD.  Continue to follow with nephrology.  NICM -with recovered LVEF.  Most recent echo 11/16/21 normal LVEF 50-55%. No signs of worsening heart failure. Continue GDMT Imdur, Metoprolol, Hydralazine, Valsartan. Volume management per hemodialysis. BP control, as above.  Mild aortic stenosis - Most recent echo 11/16/21 mild AS with mean gradient . Continue optimal BP control as above.  Disposition: Follow up in 6 month(s) with Dr. Rennis Golden or APP.  Signed, Alver Sorrow, NP 06/28/2022, 8:53 PM [facility]

## 2022-06-28 NOTE — Patient Instructions (Addendum)
Medication Instructions:  Your physician recommends that you continue on your current medications as directed. Please refer to the Current Medication list given to you today.  We sent refills of your Imdur today  *If you need a refill on your cardiac medications before your next appointment, please call your pharmacy*  Follow-Up: At [facility], you and your health needs are our priority.  As part of our continuing mission to provide you with exceptional heart care, we have created designated Provider Care Teams.  These Care Teams include your primary Cardiologist (physician) and Advanced Practice Providers (APPs -  Physician Assistants and Nurse Practitioners) who all work together to provide you with the care you need, when you need it.  We recommend signing up for the patient portal called "MyChart".  Sign up information is provided on this After Visit Summary.  MyChart is used to connect with patients for Virtual Visits (Telemedicine).  Patients are able to view lab/test results, encounter notes, upcoming appointments, etc.  Non-urgent messages can be sent to your provider as well.   To learn more about what you can do with MyChart, go to [email or web address].    Your next appointment:   6 month(s)  Provider:   K. Italy Hilty, MD    Other Instructions Call us if blood pressure consistently more than 140/90 or less than 110/60 at home.

## 2022-06-29 DIAGNOSIS — D509 Iron deficiency anemia, unspecified: Secondary | ICD-10-CM | POA: Diagnosis not present

## 2022-06-29 DIAGNOSIS — N186 End stage renal disease: Secondary | ICD-10-CM | POA: Diagnosis not present

## 2022-06-29 DIAGNOSIS — Z992 Dependence on renal dialysis: Secondary | ICD-10-CM | POA: Diagnosis not present

## 2022-06-29 DIAGNOSIS — N2581 Secondary hyperparathyroidism of renal origin: Secondary | ICD-10-CM | POA: Diagnosis not present

## 2022-06-29 DIAGNOSIS — D631 Anemia in chronic kidney disease: Secondary | ICD-10-CM | POA: Diagnosis not present

## 2022-07-02 DIAGNOSIS — N2581 Secondary hyperparathyroidism of renal origin: Secondary | ICD-10-CM | POA: Diagnosis not present

## 2022-07-02 DIAGNOSIS — D631 Anemia in chronic kidney disease: Secondary | ICD-10-CM | POA: Diagnosis not present

## 2022-07-02 DIAGNOSIS — Z992 Dependence on renal dialysis: Secondary | ICD-10-CM | POA: Diagnosis not present

## 2022-07-02 DIAGNOSIS — D509 Iron deficiency anemia, unspecified: Secondary | ICD-10-CM | POA: Diagnosis not present

## 2022-07-02 DIAGNOSIS — N186 End stage renal disease: Secondary | ICD-10-CM | POA: Diagnosis not present

## 2022-07-04 DIAGNOSIS — N186 End stage renal disease: Secondary | ICD-10-CM | POA: Diagnosis not present

## 2022-07-04 DIAGNOSIS — D631 Anemia in chronic kidney disease: Secondary | ICD-10-CM | POA: Diagnosis not present

## 2022-07-04 DIAGNOSIS — N2581 Secondary hyperparathyroidism of renal origin: Secondary | ICD-10-CM | POA: Diagnosis not present

## 2022-07-04 DIAGNOSIS — D509 Iron deficiency anemia, unspecified: Secondary | ICD-10-CM | POA: Diagnosis not present

## 2022-07-04 DIAGNOSIS — Z992 Dependence on renal dialysis: Secondary | ICD-10-CM | POA: Diagnosis not present

## 2022-07-06 DIAGNOSIS — N186 End stage renal disease: Secondary | ICD-10-CM | POA: Diagnosis not present

## 2022-07-06 DIAGNOSIS — N2581 Secondary hyperparathyroidism of renal origin: Secondary | ICD-10-CM | POA: Diagnosis not present

## 2022-07-06 DIAGNOSIS — Z992 Dependence on renal dialysis: Secondary | ICD-10-CM | POA: Diagnosis not present

## 2022-07-06 DIAGNOSIS — D509 Iron deficiency anemia, unspecified: Secondary | ICD-10-CM | POA: Diagnosis not present

## 2022-07-06 DIAGNOSIS — D631 Anemia in chronic kidney disease: Secondary | ICD-10-CM | POA: Diagnosis not present

## 2022-07-09 DIAGNOSIS — N2581 Secondary hyperparathyroidism of renal origin: Secondary | ICD-10-CM | POA: Diagnosis not present

## 2022-07-09 DIAGNOSIS — Z992 Dependence on renal dialysis: Secondary | ICD-10-CM | POA: Diagnosis not present

## 2022-07-09 DIAGNOSIS — D509 Iron deficiency anemia, unspecified: Secondary | ICD-10-CM | POA: Diagnosis not present

## 2022-07-09 DIAGNOSIS — N186 End stage renal disease: Secondary | ICD-10-CM | POA: Diagnosis not present

## 2022-07-09 DIAGNOSIS — D631 Anemia in chronic kidney disease: Secondary | ICD-10-CM | POA: Diagnosis not present

## 2022-07-10 ENCOUNTER — Ambulatory Visit: Payer: Self-pay

## 2022-07-10 NOTE — Patient Outreach (Signed)
  Care Coordination   07/10/2022 Name: Kirk [name] MRN: [id number] DOB: [birth year]   Care Coordination Outreach Attempts:  An unsuccessful telephone outreach was attempted for a scheduled appointment today.  Follow Up Plan:  Additional outreach attempts will be made to offer the patient care coordination information and services.   Encounter Outcome:  Pt. Request to Call Back   Care Coordination Interventions:  No, not indicated    Delsa Sale, RN, BSN, CCM Care Management Coordinator [facility]  Direct Phone: [phone]

## 2022-07-11 DIAGNOSIS — Z992 Dependence on renal dialysis: Secondary | ICD-10-CM | POA: Diagnosis not present

## 2022-07-11 DIAGNOSIS — N2581 Secondary hyperparathyroidism of renal origin: Secondary | ICD-10-CM | POA: Diagnosis not present

## 2022-07-11 DIAGNOSIS — D631 Anemia in chronic kidney disease: Secondary | ICD-10-CM | POA: Diagnosis not present

## 2022-07-11 DIAGNOSIS — D509 Iron deficiency anemia, unspecified: Secondary | ICD-10-CM | POA: Diagnosis not present

## 2022-07-11 DIAGNOSIS — N186 End stage renal disease: Secondary | ICD-10-CM | POA: Diagnosis not present

## 2022-07-13 ENCOUNTER — Ambulatory Visit: Payer: Self-pay

## 2022-07-13 DIAGNOSIS — Z992 Dependence on renal dialysis: Secondary | ICD-10-CM | POA: Diagnosis not present

## 2022-07-13 DIAGNOSIS — N186 End stage renal disease: Secondary | ICD-10-CM | POA: Diagnosis not present

## 2022-07-13 DIAGNOSIS — N2581 Secondary hyperparathyroidism of renal origin: Secondary | ICD-10-CM | POA: Diagnosis not present

## 2022-07-13 DIAGNOSIS — D509 Iron deficiency anemia, unspecified: Secondary | ICD-10-CM | POA: Diagnosis not present

## 2022-07-13 DIAGNOSIS — D631 Anemia in chronic kidney disease: Secondary | ICD-10-CM | POA: Diagnosis not present

## 2022-07-13 NOTE — Patient Instructions (Signed)
Visit Information  Thank you for taking time to visit with me today. Please don't hesitate to contact me if I can be of assistance to you.   Following are the goals we discussed today:   Goals Addressed             This Visit's Progress    To follow up with Neurology concerning SE to seizure meds       Care Coordination Interventions: Completed successful outbound call with wife Randa Evens  Evaluation of current treatment plan related to Seizures and patient's adherence to plan as established by provider Determined patient refused to take this Seizure medication, therefore he is not currently being medicated for Seizures Encouraged wife to notify the Neurologist and report any Seizure activity to his Neurologist promptly  Determined wife verbalizes understanding about using Seizure precautions when/if needed  Reviewed next upcoming scheduled follow up with Dr. Teresa Coombs, Neurologist set for 08/23/22 @10 :15 AM      To follow up with Psychiatry       Care Coordination Interventions: Completed successful outbound call with wife Randa Evens Evaluation of current treatment plan related to sleep disorder and depression  and patient's adherence to plan as established by provider Determined patient is not sleeping more than a few hours each night despite taking his prescribed medication, Trazodone as directed  Discussed patient is having difficulty falling asleep and staying asleep, he is using his CPAP during his sleep hours  Educated wife regarding alternative sleep aids and encouraged her to discuss this with the Psychiatrist during patient's upcoming appointment  Reviewed upcoming scheduled f/u with Psychiatrist set for mid July  Discussed importance of adherence to all scheduled medical appointments      To have chronic pain evaluated       Care Coordination Interventions: Completed successful outbound call to wife Burdett Reviewed provider established plan for pain management Reviewed upcoming  scheduled pain management appointment scheduled for 08/02/22 Discussed importance of adherence to all scheduled medical appointments        Our next appointment is by telephone on 08/27/22 at 12 PM  Please call the care guide team at [phone] if you need to cancel or reschedule your appointment.   If you are experiencing a Mental Health or Behavioral Health Crisis or need someone to talk to, please call 1-800-273-TALK (toll free, 24 hour hotline)  Patient verbalizes understanding of instructions and care plan provided today and agrees to view in MyChart. Active MyChart status and patient understanding of how to access instructions and care plan via MyChart confirmed with patient.     Delsa Sale, RN, BSN, CCM Care Management Coordinator [facility] Care Management Direct Phone: [phone]

## 2022-07-13 NOTE — Patient Outreach (Signed)
  Care Coordination   Follow Up Visit Note   07/13/2022 Name: Kirk [name] MRN: [id number] DOB: November 28, 1960  Kirk [name] is a 62 y.o. year old male who sees Ngetich, Dinah C, NP for primary care. I spoke with  Kirk [name] by phone today.  What matters to the patients health and wellness today?  Patient would like to seek medical attention to help improve his pain and insomnia.     Goals Addressed             This Visit's Progress    To follow up with Neurology concerning SE to seizure meds       Care Coordination Interventions: Completed successful outbound call with wife Randa Evens  Evaluation of current treatment plan related to Seizures and patient's adherence to plan as established by provider Determined patient refused to take this Seizure medication, therefore he is not currently being medicated for Seizures Encouraged wife to notify the Neurologist and report any Seizure activity to his Neurologist promptly  Determined wife verbalizes understanding about using Seizure precautions when/if needed  Reviewed next upcoming scheduled follow up with Dr. Teresa Coombs, Neurologist set for 08/23/22 @10 :15 AM      To follow up with Psychiatry       Care Coordination Interventions: Completed successful outbound call with wife Randa Evens Evaluation of current treatment plan related to sleep disorder and depression  and patient's adherence to plan as established by provider Determined patient is not sleeping more than a few hours each night despite taking his prescribed medication, Trazodone as directed  Discussed patient is having difficulty falling asleep and staying asleep, he is using his CPAP during his sleep hours  Educated wife regarding alternative sleep aids and encouraged her to discuss this with the Psychiatrist during patient's upcoming appointment  Reviewed upcoming scheduled f/u with Psychiatrist set for mid July  Discussed importance of adherence to all scheduled medical  appointments     To have chronic pain evaluated       Care Coordination Interventions: Completed successful outbound call to wife [place] Reviewed provider established plan for pain management Reviewed upcoming scheduled pain management appointment scheduled for 08/02/22 Discussed importance of adherence to all scheduled medical appointments    Interventions Today    Flowsheet Row Most Recent Value  Chronic Disease   Chronic disease during today's visit Other  [Seizures,  sleep disorder,  chronic pain]  General Interventions   General Interventions Discussed/Reviewed General Interventions Discussed, General Interventions Reviewed, Doctor Visits  Doctor Visits Discussed/Reviewed Doctor Visits Discussed, Doctor Visits Reviewed, PCP, Specialist  Education Interventions   Education Provided Provided Education  Provided Verbal Education On When to see the doctor  Mental Health Interventions   Mental Health Discussed/Reviewed Mental Health Discussed, Mental Health Reviewed, Other, Depression  [sleep disorder]  Pharmacy Interventions   Pharmacy Dicussed/Reviewed Pharmacy Topics Discussed, Pharmacy Topics Reviewed, Medications and their functions          SDOH assessments and interventions completed:  No     Care Coordination Interventions:  Yes, provided   Follow up plan: Follow up call scheduled for 08/27/22 @12 :00 PM    Encounter Outcome:  Pt. Visit Completed

## 2022-07-16 ENCOUNTER — Telehealth: Payer: Self-pay | Admitting: Neurology

## 2022-07-16 DIAGNOSIS — N2581 Secondary hyperparathyroidism of renal origin: Secondary | ICD-10-CM | POA: Diagnosis not present

## 2022-07-16 DIAGNOSIS — Z992 Dependence on renal dialysis: Secondary | ICD-10-CM | POA: Diagnosis not present

## 2022-07-16 DIAGNOSIS — D631 Anemia in chronic kidney disease: Secondary | ICD-10-CM | POA: Diagnosis not present

## 2022-07-16 DIAGNOSIS — N186 End stage renal disease: Secondary | ICD-10-CM | POA: Diagnosis not present

## 2022-07-16 DIAGNOSIS — D509 Iron deficiency anemia, unspecified: Secondary | ICD-10-CM | POA: Diagnosis not present

## 2022-07-16 NOTE — Telephone Encounter (Signed)
Pt wife called. Stated she was not able to get new medication Dr. Teresa Coombs called in for him. Stated pt had not had another episode like before.

## 2022-07-16 NOTE — Telephone Encounter (Signed)
Returned call to wife and pt. State that patient has a reaction to briviact a little over three weeks ago and has not had it since then. Wife states he may have a lapse in memory every once in a while but that it is not something new. He has not had any episodes or new symptoms since stopping the medication. She has tried to crush up and put in ice cream but he will not eat it.

## 2022-07-17 NOTE — Telephone Encounter (Signed)
Called and relayed message to patient's wife per [facility]

## 2022-07-18 DIAGNOSIS — D509 Iron deficiency anemia, unspecified: Secondary | ICD-10-CM | POA: Diagnosis not present

## 2022-07-18 DIAGNOSIS — D631 Anemia in chronic kidney disease: Secondary | ICD-10-CM | POA: Diagnosis not present

## 2022-07-18 DIAGNOSIS — N2581 Secondary hyperparathyroidism of renal origin: Secondary | ICD-10-CM | POA: Diagnosis not present

## 2022-07-18 DIAGNOSIS — N186 End stage renal disease: Secondary | ICD-10-CM | POA: Diagnosis not present

## 2022-07-18 DIAGNOSIS — Z992 Dependence on renal dialysis: Secondary | ICD-10-CM | POA: Diagnosis not present

## 2022-07-20 DIAGNOSIS — D631 Anemia in chronic kidney disease: Secondary | ICD-10-CM | POA: Diagnosis not present

## 2022-07-20 DIAGNOSIS — Z992 Dependence on renal dialysis: Secondary | ICD-10-CM | POA: Diagnosis not present

## 2022-07-20 DIAGNOSIS — N2581 Secondary hyperparathyroidism of renal origin: Secondary | ICD-10-CM | POA: Diagnosis not present

## 2022-07-20 DIAGNOSIS — D509 Iron deficiency anemia, unspecified: Secondary | ICD-10-CM | POA: Diagnosis not present

## 2022-07-20 DIAGNOSIS — N186 End stage renal disease: Secondary | ICD-10-CM | POA: Diagnosis not present

## 2022-07-21 NOTE — Progress Notes (Signed)
Patient Care Team: Ngetich, Donalee Citrin, NP as PCP - General (Family Medicine) Rennis Golden Lisette Abu, MD as PCP - Cardiology (Cardiology) Griselda Miner, MD as Consulting Physician (General Surgery) Coralyn Helling, MD as Consulting Physician (Pulmonary Disease) Lauris Poag, MD as Consulting Physician (Nephrology) Sherren Kerns, MD (Inactive) as Consulting Physician (Vascular Surgery) Chrystie Nose, MD as Consulting Physician (Cardiology) Rachael Fee, MD as Attending Physician (Gastroenterology) Camille Bal, MD as Consulting Physician (Nephrology) Clarene Duke Karma Lew, RN as [facility] Care Management  DIAGNOSIS: No diagnosis found.  SUMMARY OF ONCOLOGIC HISTORY: Oncology History   No history exists.    CHIEF COMPLIANT:  Follow-up Pancytopenia     INTERVAL HISTORY: Kirk [name] is a 62 y.o with the above-mentioned. He presents to the clinic today for a follow-up to review labs. Pt has no new concerns to report to the clinic today.   ALLERGIES:  is allergic to codeine and hydrocodone-acetaminophen.  MEDICATIONS:  Current Outpatient Medications  Medication Sig Dispense Refill   acetaminophen (TYLENOL) 500 MG tablet Take 1 tablet (500 mg total) by mouth every 6 (six) hours as needed for moderate pain.     amLODipine (NORVASC) 10 MG tablet Take 1 tablet (10 mg total) by mouth every evening.     aspirin 81 MG chewable tablet Chew 81 mg by mouth daily.     budesonide-formoterol (SYMBICORT) 160-4.5 MCG/ACT inhaler Inhale 2 puffs into the lungs as needed.     calcitRIOL (ROCALTROL) 0.5 MCG capsule Take 2 capsules (1 mcg total) by mouth every Monday, Wednesday, and Friday with hemodialysis. 60 capsule 1   Cholecalciferol (VITAMIN D3) 25 MCG (1000 UT) CAPS Take 4,000 Units by mouth daily.     cinacalcet (SENSIPAR) 30 MG tablet Take 2 tablets (60 mg total) by mouth 3 (three) times a week. 60 tablet    dextromethorphan-guaiFENesin (MUCINEX DM) 30-600 MG 12hr tablet  Take 1 tablet by mouth 2 (two) times daily. 14 tablet 0   diphenhydrAMINE (BENADRYL) 25 mg capsule Take 25 mg by mouth every 6 (six) hours as needed for allergies.     doxazosin (CARDURA) 8 MG tablet Take 1 tablet (8 mg total) by mouth daily. 90 tablet 3   DULoxetine (CYMBALTA) 60 MG capsule Take 1 capsule (60 mg total) by mouth daily. 90 capsule 1   FOSRENOL 1000 MG PACK Take 1 packet by mouth 3 (three) times daily.     hydrALAZINE (APRESOLINE) 100 MG tablet Take 1 tablet (100 mg total) by mouth 3 (three) times daily. 270 tablet 3   isosorbide mononitrate (IMDUR) 120 MG 24 hr tablet Take 1 tablet (120 mg total) by mouth daily. 90 tablet 3   methocarbamol (ROBAXIN) 750 MG tablet Take 1 tablet (750 mg total) by mouth 4 (four) times daily.     metoprolol tartrate (LOPRESSOR) 100 MG tablet Take 1 tablet (100 mg total) by mouth 2 (two) times daily. 180 tablet 1   multivitamin (RENA-VIT) TABS tablet Take 1 tablet by mouth daily.  0   ondansetron (ZOFRAN-ODT) 4 MG disintegrating tablet TAKE 1 TABLET BY MOUTH EVERY 8 HOURS AS NEEDED FOR NAUSEA AND VOMITING 14 tablet 3   pantoprazole (PROTONIX) 40 MG tablet Take 1 tablet (40 mg total) by mouth 2 (two) times daily before a meal.     pravastatin (PRAVACHOL) 40 MG tablet TAKE 1 TABLET BY MOUTH EVERY DAY IN THE EVENING 90 tablet 2   sevelamer carbonate (RENVELA) 800 MG tablet Take 2 tablets (  1,600 mg total) by mouth as needed (snacks).     sevelamer carbonate (RENVELA) 800 MG tablet Take 4 tablets (3,200 mg total) by mouth 3 (three) times daily with meals.     sucralfate (CARAFATE) 1 GM/10ML suspension Take 10 mLs (1 g total) by mouth 4 (four) times daily -  with meals and at bedtime. 420 mL 0   traZODone (DESYREL) 50 MG tablet Take 0.5-1 tablets (25-50 mg total) by mouth at bedtime as needed. for sleep 90 tablet 1   No current facility-administered medications for this visit.    PHYSICAL EXAMINATION: ECOG PERFORMANCE STATUS: {CHL ONC ECOG  PS:[phone]}  There were no vitals filed for this visit. There were no vitals filed for this visit.  BREAST:*** No palpable masses or nodules in either right or left breasts. No palpable axillary supraclavicular or infraclavicular adenopathy no breast tenderness or nipple discharge. (exam performed in the presence of a chaperone)  LABORATORY DATA:  I have reviewed the data as listed    Latest Ref Rng & Units 05/09/2022    2:24 AM 05/08/2022    2:27 AM 05/07/2022    3:27 AM  CMP  Glucose 70 - 99 mg/dL 99  161  096   BUN 6 - 20 mg/dL 56  66  98   Creatinine 0.61 - 1.24 mg/dL 0.45  4.09  81.19   Sodium 135 - 145 mmol/L 137  135  135   Potassium 3.5 - 5.1 mmol/L 4.4  5.8  5.5   Chloride 98 - 111 mmol/L 97  97  96   CO2 22 - 32 mmol/L 27  26  22    Calcium 8.9 - 10.3 mg/dL 9.3  9.1  9.7   Total Protein 6.5 - 8.1 g/dL 6.0  5.8  5.9   Total Bilirubin 0.3 - 1.2 mg/dL 0.7  1.1  1.4   Alkaline Phos 38 - 126 U/L 50  53  51   AST 15 - 41 U/L 12  17  11    ALT 0 - 44 U/L 17  18  15      Lab Results  Component Value Date   WBC 3.0 (L) 05/22/2022   HGB 10.4 (L) 05/22/2022   HCT 32.3 (L) 05/22/2022   MCV 95.3 05/22/2022   PLT 59 (L) 05/22/2022   NEUTROABS 1,884 05/22/2022    ASSESSMENT & PLAN:  No problem-specific Assessment & Plan notes found for this encounter.    No orders of the defined types were placed in this encounter.  The patient has a good understanding of the overall plan. he agrees with it. he will call with any problems that may develop before the next visit here. Total time spent: 30 mins including face to face time and time spent for planning, charting and co-ordination of care   Sherlyn Lick, CMA 07/21/22    I Janan Ridge am acting as a Neurosurgeon for [employer]  ***

## 2022-07-23 ENCOUNTER — Other Ambulatory Visit: Payer: Self-pay

## 2022-07-23 DIAGNOSIS — D61818 Other pancytopenia: Secondary | ICD-10-CM

## 2022-07-23 DIAGNOSIS — D631 Anemia in chronic kidney disease: Secondary | ICD-10-CM | POA: Diagnosis not present

## 2022-07-23 DIAGNOSIS — D509 Iron deficiency anemia, unspecified: Secondary | ICD-10-CM | POA: Diagnosis not present

## 2022-07-23 DIAGNOSIS — N2581 Secondary hyperparathyroidism of renal origin: Secondary | ICD-10-CM | POA: Diagnosis not present

## 2022-07-23 DIAGNOSIS — N186 End stage renal disease: Secondary | ICD-10-CM | POA: Diagnosis not present

## 2022-07-23 DIAGNOSIS — T8612 Kidney transplant failure: Secondary | ICD-10-CM | POA: Diagnosis not present

## 2022-07-23 DIAGNOSIS — Z992 Dependence on renal dialysis: Secondary | ICD-10-CM | POA: Diagnosis not present

## 2022-07-24 ENCOUNTER — Other Ambulatory Visit: Payer: Self-pay

## 2022-07-24 ENCOUNTER — Telehealth: Payer: Self-pay

## 2022-07-24 ENCOUNTER — Inpatient Hospital Stay: Payer: Medicare Other | Attending: Hematology and Oncology

## 2022-07-24 ENCOUNTER — Inpatient Hospital Stay (HOSPITAL_BASED_OUTPATIENT_CLINIC_OR_DEPARTMENT_OTHER): Payer: Medicare Other | Admitting: Hematology and Oncology

## 2022-07-24 VITALS — BP 146/60 | HR 64 | Temp 97.7°F | Resp 18 | Ht 67.0 in | Wt 189.2 lb

## 2022-07-24 DIAGNOSIS — D61818 Other pancytopenia: Secondary | ICD-10-CM

## 2022-07-24 DIAGNOSIS — N186 End stage renal disease: Secondary | ICD-10-CM | POA: Diagnosis not present

## 2022-07-24 DIAGNOSIS — E119 Type 2 diabetes mellitus without complications: Secondary | ICD-10-CM | POA: Insufficient documentation

## 2022-07-24 DIAGNOSIS — Q8781 Alport syndrome: Secondary | ICD-10-CM | POA: Insufficient documentation

## 2022-07-24 DIAGNOSIS — I509 Heart failure, unspecified: Secondary | ICD-10-CM | POA: Insufficient documentation

## 2022-07-24 DIAGNOSIS — D631 Anemia in chronic kidney disease: Secondary | ICD-10-CM | POA: Diagnosis not present

## 2022-07-24 DIAGNOSIS — Z992 Dependence on renal dialysis: Secondary | ICD-10-CM | POA: Diagnosis not present

## 2022-07-24 LAB — CMP (CANCER CENTER ONLY)
ALT: 9 U/L (ref 0–44)
AST: 12 U/L — ABNORMAL LOW (ref 15–41)
Albumin: 3.9 g/dL (ref 3.5–5.0)
Alkaline Phosphatase: 82 U/L (ref 38–126)
Anion gap: 13 (ref 5–15)
BUN: 47 mg/dL — ABNORMAL HIGH (ref 6–20)
CO2: 29 mmol/L (ref 22–32)
Calcium: 8.6 mg/dL — ABNORMAL LOW (ref 8.9–10.3)
Chloride: 98 mmol/L (ref 98–111)
Creatinine: 9.87 mg/dL (ref 0.61–1.24)
GFR, Estimated: 6 mL/min — ABNORMAL LOW (ref >60)
Glucose, Bld: 111 mg/dL — ABNORMAL HIGH (ref 70–99)
Potassium: 4.9 mmol/L (ref 3.5–5.1)
Sodium: 140 mmol/L (ref 135–145)
Total Bilirubin: 0.6 mg/dL (ref 0.3–1.2)
Total Protein: 6.5 g/dL (ref 6.5–8.1)

## 2022-07-24 LAB — VITAMIN B12: Vitamin B-12: 980 pg/mL — ABNORMAL HIGH (ref 180–914)

## 2022-07-24 LAB — CBC WITH DIFFERENTIAL (CANCER CENTER ONLY)
Abs Immature Granulocytes: 0 10*3/uL (ref 0.00–0.07)
Basophils Absolute: 0.1 10*3/uL (ref 0.0–0.1)
Basophils Relative: 1 %
Eosinophils Absolute: 0.1 10*3/uL (ref 0.0–0.5)
Eosinophils Relative: 2 %
HCT: 36.4 % — ABNORMAL LOW (ref 39.0–52.0)
Hemoglobin: 12.2 g/dL — ABNORMAL LOW (ref 13.0–17.0)
Immature Granulocytes: 0 %
Lymphocytes Relative: 20 %
Lymphs Abs: 1 10*3/uL (ref 0.7–4.0)
MCH: 32.6 pg (ref 26.0–34.0)
MCHC: 33.5 g/dL (ref 30.0–36.0)
MCV: 97.3 fL (ref 80.0–100.0)
Monocytes Absolute: 0.4 10*3/uL (ref 0.1–1.0)
Monocytes Relative: 8 %
Neutro Abs: 3.6 10*3/uL (ref 1.7–7.7)
Neutrophils Relative %: 69 %
Platelet Count: 114 10*3/uL — ABNORMAL LOW (ref 150–400)
RBC: 3.74 MIL/uL — ABNORMAL LOW (ref 4.22–5.81)
RDW: 14.1 % (ref 11.5–15.5)
WBC Count: 5.2 10*3/uL (ref 4.0–10.5)
nRBC: 0 % (ref 0.0–0.2)

## 2022-07-24 NOTE — Telephone Encounter (Signed)
CRITICAL VALUE STICKER  CRITICAL VALUE: CREATININE 9.87  RECEIVER (on-site recipient of call): Glenda Spelman P. LPN  DATE & TIME NOTIFIED: 07/24/22 329 p  MESSENGER (representative from lab): Mindi Junker   MD NOTIFIED: Dr. Pamelia Hoit  TIME OF NOTIFICATION:331 p

## 2022-07-24 NOTE — Progress Notes (Signed)
Addendum to Telephone Encounter:  Critical creatinine value was reported to Dr. Al Pimple, No response given to this nurse.  No further concerns noted.

## 2022-07-24 NOTE — Progress Notes (Signed)
Critical Cr 9.87 relayed to Dr. Burnice Logan Iruku at 1538 on 07/24/22. Dr. Serena Croissant out of office at the time.

## 2022-07-24 NOTE — Assessment & Plan Note (Signed)
03/03/2020: WBC 4.8, hemoglobin 9.8, platelets 84 01/11/2021: WBC 3.6, hemoglobin 11.6, platelets 98, creatinine 7.84 03/21/2021: WBC 2.5, hemoglobin 8.3, MCV 98.5, platelets 73, creatinine 15.97 05/16/2021: WBC 3.1, hemoglobin 11, MCV 100.6, platelets 109, SPEP: No M spike, ANA negative, creatinine 11.65, reticulocyte count absolute 104.8, reticulocyte percentage: 3.1%, erythropoietin 163.8, iron saturation 25%, ferritin 827, folate 7.6, B12 302 08/01/2021: RBC 3, hemoglobin 9.6, platelets 91 08/24/2021: WBC 4.5, hemoglobin 10, platelets 86 03/06/2022: WBC 2.4, hemoglobin 10.7, platelets 48, ANC 1.4, ALC 0.6   Patient has multiple chronic illnesses including 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

## 2022-07-25 DIAGNOSIS — D631 Anemia in chronic kidney disease: Secondary | ICD-10-CM | POA: Diagnosis not present

## 2022-07-25 DIAGNOSIS — N186 End stage renal disease: Secondary | ICD-10-CM | POA: Diagnosis not present

## 2022-07-25 DIAGNOSIS — N2581 Secondary hyperparathyroidism of renal origin: Secondary | ICD-10-CM | POA: Diagnosis not present

## 2022-07-25 DIAGNOSIS — Z992 Dependence on renal dialysis: Secondary | ICD-10-CM | POA: Diagnosis not present

## 2022-07-25 DIAGNOSIS — D509 Iron deficiency anemia, unspecified: Secondary | ICD-10-CM | POA: Diagnosis not present

## 2022-07-27 DIAGNOSIS — Z992 Dependence on renal dialysis: Secondary | ICD-10-CM | POA: Diagnosis not present

## 2022-07-27 DIAGNOSIS — D509 Iron deficiency anemia, unspecified: Secondary | ICD-10-CM | POA: Diagnosis not present

## 2022-07-27 DIAGNOSIS — N2581 Secondary hyperparathyroidism of renal origin: Secondary | ICD-10-CM | POA: Diagnosis not present

## 2022-07-27 DIAGNOSIS — N186 End stage renal disease: Secondary | ICD-10-CM | POA: Diagnosis not present

## 2022-07-27 DIAGNOSIS — D631 Anemia in chronic kidney disease: Secondary | ICD-10-CM | POA: Diagnosis not present

## 2022-07-29 NOTE — Telephone Encounter (Signed)
Agree. Plan was to discontinue ASM in the future, so if they stop the Briviact and no seizure, great, we will continue to observe them.

## 2022-07-30 ENCOUNTER — Emergency Department (HOSPITAL_COMMUNITY)
Admission: EM | Admit: 2022-07-30 | Discharge: 2022-07-30 | Disposition: A | Discharge status: Home / Self Care | Payer: Medicare Other | Attending: Emergency Medicine | Admitting: Emergency Medicine

## 2022-07-30 ENCOUNTER — Emergency Department (HOSPITAL_COMMUNITY): Payer: Medicare Other

## 2022-07-30 ENCOUNTER — Other Ambulatory Visit: Payer: Self-pay

## 2022-07-30 DIAGNOSIS — N186 End stage renal disease: Secondary | ICD-10-CM | POA: Diagnosis not present

## 2022-07-30 DIAGNOSIS — W19XXXA Unspecified fall, initial encounter: Secondary | ICD-10-CM | POA: Diagnosis not present

## 2022-07-30 DIAGNOSIS — Z981 Arthrodesis status: Secondary | ICD-10-CM | POA: Diagnosis not present

## 2022-07-30 DIAGNOSIS — D631 Anemia in chronic kidney disease: Secondary | ICD-10-CM | POA: Diagnosis not present

## 2022-07-30 DIAGNOSIS — R519 Headache, unspecified: Secondary | ICD-10-CM | POA: Diagnosis not present

## 2022-07-30 DIAGNOSIS — D509 Iron deficiency anemia, unspecified: Secondary | ICD-10-CM | POA: Diagnosis not present

## 2022-07-30 DIAGNOSIS — M542 Cervicalgia: Secondary | ICD-10-CM | POA: Diagnosis not present

## 2022-07-30 DIAGNOSIS — R55 Syncope and collapse: Secondary | ICD-10-CM | POA: Diagnosis not present

## 2022-07-30 DIAGNOSIS — R059 Cough, unspecified: Secondary | ICD-10-CM | POA: Diagnosis not present

## 2022-07-30 DIAGNOSIS — N2581 Secondary hyperparathyroidism of renal origin: Secondary | ICD-10-CM | POA: Diagnosis not present

## 2022-07-30 DIAGNOSIS — S199XXA Unspecified injury of neck, initial encounter: Secondary | ICD-10-CM | POA: Diagnosis not present

## 2022-07-30 DIAGNOSIS — Z1152 Encounter for screening for COVID-19: Secondary | ICD-10-CM | POA: Diagnosis not present

## 2022-07-30 DIAGNOSIS — R42 Dizziness and giddiness: Secondary | ICD-10-CM | POA: Diagnosis not present

## 2022-07-30 DIAGNOSIS — I10 Essential (primary) hypertension: Secondary | ICD-10-CM | POA: Diagnosis not present

## 2022-07-30 DIAGNOSIS — I6782 Cerebral ischemia: Secondary | ICD-10-CM | POA: Diagnosis not present

## 2022-07-30 DIAGNOSIS — S0990XA Unspecified injury of head, initial encounter: Secondary | ICD-10-CM | POA: Diagnosis not present

## 2022-07-30 DIAGNOSIS — Z992 Dependence on renal dialysis: Secondary | ICD-10-CM | POA: Diagnosis not present

## 2022-07-30 LAB — RESP PANEL BY RT-PCR (RSV, FLU A&B, COVID)  RVPGX2
Influenza A by PCR: NEGATIVE
Influenza B by PCR: NEGATIVE
Resp Syncytial Virus by PCR: NEGATIVE
SARS Coronavirus 2 by RT PCR: NEGATIVE

## 2022-07-30 LAB — CBC WITH DIFFERENTIAL/PLATELET
Abs Immature Granulocytes: 0.01 10*3/uL (ref 0.00–0.07)
Basophils Absolute: 0 10*3/uL (ref 0.0–0.1)
Basophils Relative: 1 %
Eosinophils Absolute: 0.1 10*3/uL (ref 0.0–0.5)
Eosinophils Relative: 2 %
HCT: 32.8 % — ABNORMAL LOW (ref 39.0–52.0)
Hemoglobin: 10.8 g/dL — ABNORMAL LOW (ref 13.0–17.0)
Immature Granulocytes: 0 %
Lymphocytes Relative: 18 %
Lymphs Abs: 0.7 10*3/uL (ref 0.7–4.0)
MCH: 32.2 pg (ref 26.0–34.0)
MCHC: 32.9 g/dL (ref 30.0–36.0)
MCV: 97.9 fL (ref 80.0–100.0)
Monocytes Absolute: 0.4 10*3/uL (ref 0.1–1.0)
Monocytes Relative: 10 %
Neutro Abs: 2.7 10*3/uL (ref 1.7–7.7)
Neutrophils Relative %: 69 %
Platelets: 83 10*3/uL — ABNORMAL LOW (ref 150–400)
RBC: 3.35 MIL/uL — ABNORMAL LOW (ref 4.22–5.81)
RDW: 13.2 % (ref 11.5–15.5)
WBC: 3.8 10*3/uL — ABNORMAL LOW (ref 4.0–10.5)
nRBC: 0 % (ref 0.0–0.2)

## 2022-07-30 LAB — BASIC METABOLIC PANEL
Anion gap: 14 (ref 5–15)
BUN: 54 mg/dL — ABNORMAL HIGH (ref 6–20)
CO2: 26 mmol/L (ref 22–32)
Calcium: 8.6 mg/dL — ABNORMAL LOW (ref 8.9–10.3)
Chloride: 95 mmol/L — ABNORMAL LOW (ref 98–111)
Creatinine, Ser: 10.2 mg/dL — ABNORMAL HIGH (ref 0.61–1.24)
GFR, Estimated: 5 mL/min — ABNORMAL LOW (ref >60)
Glucose, Bld: 104 mg/dL — ABNORMAL HIGH (ref 70–99)
Potassium: 4.5 mmol/L (ref 3.5–5.1)
Sodium: 135 mmol/L (ref 135–145)

## 2022-07-30 NOTE — Progress Notes (Signed)
Pt needles from outpatient clinic have been removed. Access taped down no bleeding. Present thrill and present bruit

## 2022-07-30 NOTE — ED Triage Notes (Signed)
Patient arrives by EMS from dialysis, patient was in bathroom and reports felt dizzy and fell face forward.   Patient reports sharp pains to all extremities, and a shooting pain that causes legs to spasm.   Patient has history of surgery to neck.   Patient only received 1 hour of dialysis.   CHG 164

## 2022-07-30 NOTE — ED Notes (Signed)
Pt to have fistula de accessed by dialysis RN prior to d/c.

## 2022-07-30 NOTE — ED Provider Notes (Signed)
[place] EMERGENCY DEPARTMENT AT [facility] Provider Note   CSN: [id number] Arrival date & time: 07/30/22  1008     History Chief Complaint  Patient presents with   Fall   Neck Pain          Fall  Neck Pain  Kirk [name] is a 62 y.o. male presenting for neck pain after a fall.  He had a witnessed fall at the dialysis unit today. Reports feeling lightheaded, and legs becoming weak.    He is endorsing headaches, neck pain. He denies chest pain, nausea or vomiting. Pt also reports ongoing cough, and congestion over the past 2 weeks. He denies any sick contacts and  fevers.   Patient's recorded medical, surgical, social, medication list and allergies were reviewed in the Snapshot window as part of the initial history.   Review of Systems   Review of Systems  Musculoskeletal:  Positive for neck pain.    Physical Exam Updated Vital Signs BP (!) 185/72 (BP Location: Right Arm)   Pulse 60   Temp 97.7 F (36.5 C) (Oral)   SpO2 100%  Physical Exam General: Pleasant not in acute distress. CV: RRR. No murmurs, rubs, or gallops. No LE edema Pulmonary: Bilateral crackles. Abdominal: Soft, nontender, nondistended. Normal bowel sounds. Extremities: Palpable pulses. Normal ROM. Neuro: A&Ox3. No focal deficit. Psych: Normal mood and affect   ED Course/ Medical Decision Making/ A&P    Procedures Procedures   Medications Ordered in ED Medications - No data to display  Medical Decision Making:    Kirk [name] is a 62 y.o. male who presented to the ED today with fall detailed above.     Handoff received from EMS.  Patient placed on continuous vitals and telemetry monitoring while in ED which was reviewed periodically.   Complete initial physical exam performed, notably the patient  was stable.  No focal deficits on exam.   Reviewed and confirmed nursing documentation for past medical history, family history, social history.    Initial Assessment:    With the patient's presentation of fall, likely differential ddx includes  ACS vs.presyncope vs TIA. ACS like less likely due to no ischemic changes on EKG.   This is most consistent with an acute life/limb threatening illness complicated by underlying chronic conditions.  Initial Plan:  CT of the head and neck w/o contrast Screening labs including CBC and Metabolic panel to evaluate for infectious or metabolic etiology of disease.  CXR to evaluate for structural/infectious intrathoracic pathology.  EKG to evaluate for cardiac pathology.  Objective evaluation as below reviewed with plan for close reassessment  Initial Study Results:   Laboratory  All laboratory results reviewed without evidence of clinically relevant pathology.     Radiology  All images reviewed independently. Agree with radiology report at this time.   CT HEAD WO CONTRAST ( )  Result Date: 07/30/2022 CLINICAL DATA:  Head trauma, moderate-severe; Polytrauma, blunt EXAM: CT HEAD WITHOUT CONTRAST CT CERVICAL SPINE WITHOUT CONTRAST TECHNIQUE: Multidetector CT imaging of the head and cervical spine was performed following the standard protocol without intravenous contrast. Multiplanar CT image reconstructions of the cervical spine were also generated. RADIATION DOSE REDUCTION: This exam was performed according to the departmental dose-optimization program which includes automated exposure control, adjustment of the mA and/or kV according to patient size and/or use of iterative reconstruction technique. COMPARISON:  CTA Head/Neck 11/14/21 FINDINGS: CT HEAD FINDINGS Brain: No evidence of acute infarction, hemorrhage, hydrocephalus, extra-axial collection or mass lesion/mass  effect. Sequela of mild chronic microvascular ischemic change. Vascular: No hyperdense vessel or unexpected calcification. Skull: Normal. Negative for fracture or focal lesion. Sinuses/Orbits: No middle ear or mastoid effusion. Paranasal sinuses are clear. Orbits  are unremarkable. Other: None. CT CERVICAL SPINE FINDINGS Alignment: Straightening of the normal cervical lordosis. Skull base and vertebrae: Postsurgical changes from C6-C7 ACDF there are screw tracks from prior fusion hardware in the C5 vertebral body. Unchanged irregular endplate changes at the superior endplate of T1. There may be partial osseous fusion at the C6-C7 level. There is complete fusion at C5-C6. Soft tissues and spinal canal: Evidence of high-grade spinal canal stenosis. Disc levels:  No evidence of high-grade spinal canal stenosis. Upper chest: Negative. Other: None IMPRESSION: 1. No acute intracranial abnormality. 2. No acute fracture or traumatic malalignment of the cervical spine. Electronically Signed   By: Lorenza Cambridge M.D.   On: 07/30/2022 12:08   CT Cervical Spine Wo Contrast  Result Date: 07/30/2022 CLINICAL DATA:  Head trauma, moderate-severe; Polytrauma, blunt EXAM: CT HEAD WITHOUT CONTRAST CT CERVICAL SPINE WITHOUT CONTRAST TECHNIQUE: Multidetector CT imaging of the head and cervical spine was performed following the standard protocol without intravenous contrast. Multiplanar CT image reconstructions of the cervical spine were also generated. RADIATION DOSE REDUCTION: This exam was performed according to the departmental dose-optimization program which includes automated exposure control, adjustment of the mA and/or kV according to patient size and/or use of iterative reconstruction technique. COMPARISON:  CTA Head/Neck 11/14/21 FINDINGS: CT HEAD FINDINGS Brain: No evidence of acute infarction, hemorrhage, hydrocephalus, extra-axial collection or mass lesion/mass effect. Sequela of mild chronic microvascular ischemic change. Vascular: No hyperdense vessel or unexpected calcification. Skull: Normal. Negative for fracture or focal lesion. Sinuses/Orbits: No middle ear or mastoid effusion. Paranasal sinuses are clear. Orbits are unremarkable. Other: None. CT CERVICAL SPINE FINDINGS  Alignment: Straightening of the normal cervical lordosis. Skull base and vertebrae: Postsurgical changes from C6-C7 ACDF there are screw tracks from prior fusion hardware in the C5 vertebral body. Unchanged irregular endplate changes at the superior endplate of T1. There may be partial osseous fusion at the C6-C7 level. There is complete fusion at C5-C6. Soft tissues and spinal canal: Evidence of high-grade spinal canal stenosis. Disc levels:  No evidence of high-grade spinal canal stenosis. Upper chest: Negative. Other: None IMPRESSION: 1. No acute intracranial abnormality. 2. No acute fracture or traumatic malalignment of the cervical spine. Electronically Signed   By: Lorenza Cambridge M.D.   On: 07/30/2022 12:08   DG Chest 2 View  Result Date: 07/30/2022 CLINICAL DATA:  Cough EXAM: CHEST - 2 VIEW COMPARISON:  Chest x-ray May 05, 2022 FINDINGS: The cardiomediastinal silhouette is unchanged in contour. No focal pulmonary opacity. No pleural effusion or pneumothorax. The visualized upper abdomen is unremarkable. No acute osseous abnormality. IMPRESSION: No active cardiopulmonary disease. Electronically Signed   By: Jacob Moores M.D.   On: 07/30/2022 11:33      Reassessment and Plan:   Chest x-ray normal.  CT of cervical spine shows no acute fracture or malalignment of the cervical spine. CT of the head normal.   I suspect patient's syncope secondary to hypovolemia. We consulted nephrology and recommend patient skipping dialysis for today given no worsening electrolyte changes on his BMP.    He will be ready for discharge today  after his access is removed.   Clinical Impression:  1. Syncope, unspecified syncope type      Discharge   Final Clinical Impression(s) / ED Diagnoses Final  diagnoses:  Syncope, unspecified syncope type    Rx / DC Orders ED Discharge Orders     None         Laretta Bolster, MD 07/30/22 1517    Tegeler, Canary Brim, MD 07/31/22 [phone]

## 2022-07-30 NOTE — Progress Notes (Signed)
Contacted by nephrologist with request to see if pt's clinic can treat pt tomorrow. Contacted [facility] and advised clinic does not have any availability for tomorrow. Clinic to call pt tomorrow should any appts become available. Update provided to nephrologist.   Olivia Canter Renal Navigator [phone]

## 2022-08-01 DIAGNOSIS — N186 End stage renal disease: Secondary | ICD-10-CM | POA: Diagnosis not present

## 2022-08-01 DIAGNOSIS — Z992 Dependence on renal dialysis: Secondary | ICD-10-CM | POA: Diagnosis not present

## 2022-08-01 DIAGNOSIS — N2581 Secondary hyperparathyroidism of renal origin: Secondary | ICD-10-CM | POA: Diagnosis not present

## 2022-08-01 DIAGNOSIS — D509 Iron deficiency anemia, unspecified: Secondary | ICD-10-CM | POA: Diagnosis not present

## 2022-08-01 DIAGNOSIS — D631 Anemia in chronic kidney disease: Secondary | ICD-10-CM | POA: Diagnosis not present

## 2022-08-02 DIAGNOSIS — Z6830 Body mass index (BMI) 30.0-30.9, adult: Secondary | ICD-10-CM | POA: Diagnosis not present

## 2022-08-02 DIAGNOSIS — E7800 Pure hypercholesterolemia, unspecified: Secondary | ICD-10-CM | POA: Diagnosis not present

## 2022-08-02 DIAGNOSIS — I10 Essential (primary) hypertension: Secondary | ICD-10-CM | POA: Diagnosis not present

## 2022-08-02 DIAGNOSIS — I509 Heart failure, unspecified: Secondary | ICD-10-CM | POA: Diagnosis not present

## 2022-08-02 DIAGNOSIS — E612 Magnesium deficiency: Secondary | ICD-10-CM | POA: Diagnosis not present

## 2022-08-02 DIAGNOSIS — E559 Vitamin D deficiency, unspecified: Secondary | ICD-10-CM | POA: Diagnosis not present

## 2022-08-02 DIAGNOSIS — R5383 Other fatigue: Secondary | ICD-10-CM | POA: Diagnosis not present

## 2022-08-02 DIAGNOSIS — G8929 Other chronic pain: Secondary | ICD-10-CM | POA: Diagnosis not present

## 2022-08-02 DIAGNOSIS — Z79899 Other long term (current) drug therapy: Secondary | ICD-10-CM | POA: Diagnosis not present

## 2022-08-02 DIAGNOSIS — M545 Low back pain, unspecified: Secondary | ICD-10-CM | POA: Diagnosis not present

## 2022-08-03 ENCOUNTER — Other Ambulatory Visit: Payer: Self-pay | Admitting: Family

## 2022-08-03 DIAGNOSIS — Z992 Dependence on renal dialysis: Secondary | ICD-10-CM | POA: Diagnosis not present

## 2022-08-03 DIAGNOSIS — N186 End stage renal disease: Secondary | ICD-10-CM | POA: Diagnosis not present

## 2022-08-03 DIAGNOSIS — D631 Anemia in chronic kidney disease: Secondary | ICD-10-CM | POA: Diagnosis not present

## 2022-08-03 DIAGNOSIS — N2581 Secondary hyperparathyroidism of renal origin: Secondary | ICD-10-CM | POA: Diagnosis not present

## 2022-08-03 DIAGNOSIS — D509 Iron deficiency anemia, unspecified: Secondary | ICD-10-CM | POA: Diagnosis not present

## 2022-08-06 DIAGNOSIS — N186 End stage renal disease: Secondary | ICD-10-CM | POA: Diagnosis not present

## 2022-08-06 DIAGNOSIS — D631 Anemia in chronic kidney disease: Secondary | ICD-10-CM | POA: Diagnosis not present

## 2022-08-06 DIAGNOSIS — D509 Iron deficiency anemia, unspecified: Secondary | ICD-10-CM | POA: Diagnosis not present

## 2022-08-06 DIAGNOSIS — Z992 Dependence on renal dialysis: Secondary | ICD-10-CM | POA: Diagnosis not present

## 2022-08-06 DIAGNOSIS — N2581 Secondary hyperparathyroidism of renal origin: Secondary | ICD-10-CM | POA: Diagnosis not present

## 2022-08-07 DIAGNOSIS — I871 Compression of vein: Secondary | ICD-10-CM | POA: Diagnosis not present

## 2022-08-07 DIAGNOSIS — N186 End stage renal disease: Secondary | ICD-10-CM | POA: Diagnosis not present

## 2022-08-07 DIAGNOSIS — Z992 Dependence on renal dialysis: Secondary | ICD-10-CM | POA: Diagnosis not present

## 2022-08-09 DIAGNOSIS — N186 End stage renal disease: Secondary | ICD-10-CM | POA: Diagnosis not present

## 2022-08-09 DIAGNOSIS — D509 Iron deficiency anemia, unspecified: Secondary | ICD-10-CM | POA: Diagnosis not present

## 2022-08-09 DIAGNOSIS — Z992 Dependence on renal dialysis: Secondary | ICD-10-CM | POA: Diagnosis not present

## 2022-08-09 DIAGNOSIS — D631 Anemia in chronic kidney disease: Secondary | ICD-10-CM | POA: Diagnosis not present

## 2022-08-09 DIAGNOSIS — N2581 Secondary hyperparathyroidism of renal origin: Secondary | ICD-10-CM | POA: Diagnosis not present

## 2022-08-10 DIAGNOSIS — D631 Anemia in chronic kidney disease: Secondary | ICD-10-CM | POA: Diagnosis not present

## 2022-08-10 DIAGNOSIS — N186 End stage renal disease: Secondary | ICD-10-CM | POA: Diagnosis not present

## 2022-08-10 DIAGNOSIS — Z992 Dependence on renal dialysis: Secondary | ICD-10-CM | POA: Diagnosis not present

## 2022-08-10 DIAGNOSIS — N2581 Secondary hyperparathyroidism of renal origin: Secondary | ICD-10-CM | POA: Diagnosis not present

## 2022-08-10 DIAGNOSIS — D509 Iron deficiency anemia, unspecified: Secondary | ICD-10-CM | POA: Diagnosis not present

## 2022-08-11 DIAGNOSIS — N186 End stage renal disease: Secondary | ICD-10-CM | POA: Diagnosis not present

## 2022-08-11 DIAGNOSIS — D631 Anemia in chronic kidney disease: Secondary | ICD-10-CM | POA: Diagnosis not present

## 2022-08-11 DIAGNOSIS — D509 Iron deficiency anemia, unspecified: Secondary | ICD-10-CM | POA: Diagnosis not present

## 2022-08-11 DIAGNOSIS — N2581 Secondary hyperparathyroidism of renal origin: Secondary | ICD-10-CM | POA: Diagnosis not present

## 2022-08-11 DIAGNOSIS — Z992 Dependence on renal dialysis: Secondary | ICD-10-CM | POA: Diagnosis not present

## 2022-08-13 DIAGNOSIS — Z992 Dependence on renal dialysis: Secondary | ICD-10-CM | POA: Diagnosis not present

## 2022-08-13 DIAGNOSIS — N2581 Secondary hyperparathyroidism of renal origin: Secondary | ICD-10-CM | POA: Diagnosis not present

## 2022-08-13 DIAGNOSIS — N186 End stage renal disease: Secondary | ICD-10-CM | POA: Diagnosis not present

## 2022-08-13 DIAGNOSIS — D509 Iron deficiency anemia, unspecified: Secondary | ICD-10-CM | POA: Diagnosis not present

## 2022-08-13 DIAGNOSIS — D631 Anemia in chronic kidney disease: Secondary | ICD-10-CM | POA: Diagnosis not present

## 2022-08-15 DIAGNOSIS — D509 Iron deficiency anemia, unspecified: Secondary | ICD-10-CM | POA: Diagnosis not present

## 2022-08-15 DIAGNOSIS — D631 Anemia in chronic kidney disease: Secondary | ICD-10-CM | POA: Diagnosis not present

## 2022-08-15 DIAGNOSIS — N186 End stage renal disease: Secondary | ICD-10-CM | POA: Diagnosis not present

## 2022-08-15 DIAGNOSIS — Z992 Dependence on renal dialysis: Secondary | ICD-10-CM | POA: Diagnosis not present

## 2022-08-15 DIAGNOSIS — N2581 Secondary hyperparathyroidism of renal origin: Secondary | ICD-10-CM | POA: Diagnosis not present

## 2022-08-17 DIAGNOSIS — D509 Iron deficiency anemia, unspecified: Secondary | ICD-10-CM | POA: Diagnosis not present

## 2022-08-17 DIAGNOSIS — Z992 Dependence on renal dialysis: Secondary | ICD-10-CM | POA: Diagnosis not present

## 2022-08-17 DIAGNOSIS — N186 End stage renal disease: Secondary | ICD-10-CM | POA: Diagnosis not present

## 2022-08-17 DIAGNOSIS — D631 Anemia in chronic kidney disease: Secondary | ICD-10-CM | POA: Diagnosis not present

## 2022-08-17 DIAGNOSIS — N2581 Secondary hyperparathyroidism of renal origin: Secondary | ICD-10-CM | POA: Diagnosis not present

## 2022-08-19 ENCOUNTER — Other Ambulatory Visit: Payer: Self-pay | Admitting: Family

## 2022-08-20 DIAGNOSIS — D509 Iron deficiency anemia, unspecified: Secondary | ICD-10-CM | POA: Diagnosis not present

## 2022-08-20 DIAGNOSIS — N186 End stage renal disease: Secondary | ICD-10-CM | POA: Diagnosis not present

## 2022-08-20 DIAGNOSIS — N2581 Secondary hyperparathyroidism of renal origin: Secondary | ICD-10-CM | POA: Diagnosis not present

## 2022-08-20 DIAGNOSIS — Z992 Dependence on renal dialysis: Secondary | ICD-10-CM | POA: Diagnosis not present

## 2022-08-20 DIAGNOSIS — D631 Anemia in chronic kidney disease: Secondary | ICD-10-CM | POA: Diagnosis not present

## 2022-08-22 DIAGNOSIS — D509 Iron deficiency anemia, unspecified: Secondary | ICD-10-CM | POA: Diagnosis not present

## 2022-08-22 DIAGNOSIS — N186 End stage renal disease: Secondary | ICD-10-CM | POA: Diagnosis not present

## 2022-08-22 DIAGNOSIS — N2581 Secondary hyperparathyroidism of renal origin: Secondary | ICD-10-CM | POA: Diagnosis not present

## 2022-08-22 DIAGNOSIS — Z992 Dependence on renal dialysis: Secondary | ICD-10-CM | POA: Diagnosis not present

## 2022-08-22 DIAGNOSIS — D631 Anemia in chronic kidney disease: Secondary | ICD-10-CM | POA: Diagnosis not present

## 2022-08-23 ENCOUNTER — Ambulatory Visit (INDEPENDENT_AMBULATORY_CARE_PROVIDER_SITE_OTHER): Payer: Medicare Other | Admitting: Neurology

## 2022-08-23 ENCOUNTER — Encounter: Payer: Self-pay | Admitting: Neurology

## 2022-08-23 VITALS — BP 160/73 | HR 60 | Ht 67.0 in | Wt 200.0 lb

## 2022-08-23 DIAGNOSIS — R569 Unspecified convulsions: Secondary | ICD-10-CM | POA: Diagnosis not present

## 2022-08-23 DIAGNOSIS — I6200 Nontraumatic subdural hemorrhage, unspecified: Secondary | ICD-10-CM | POA: Diagnosis not present

## 2022-08-23 DIAGNOSIS — Z992 Dependence on renal dialysis: Secondary | ICD-10-CM | POA: Diagnosis not present

## 2022-08-23 DIAGNOSIS — R41843 Psychomotor deficit: Secondary | ICD-10-CM

## 2022-08-23 DIAGNOSIS — N186 End stage renal disease: Secondary | ICD-10-CM | POA: Diagnosis not present

## 2022-08-23 DIAGNOSIS — T8612 Kidney transplant failure: Secondary | ICD-10-CM | POA: Diagnosis not present

## 2022-08-23 NOTE — Patient Instructions (Signed)
Continue current medications Continue to follow with PCP Return as needed. 

## 2022-08-23 NOTE — Progress Notes (Signed)
[facility]  PATIENT: Kirk [name] DOB: 1960-08-21  REQUESTING CLINICIAN: Ngetich, Dinah C, NP HISTORY FROM: Patient but mainly spouse and chart review  REASON FOR VISIT: New seizure in the setting of SDH   HISTORICAL  CHIEF COMPLAINT:  Chief Complaint  Patient presents with   Follow-up    Rm 12. Patient with wife. Patient wife reports that they are not taking brivact and no seizures to report   INTERVAL HISTORY 08/23/2022 Patient presents today for follow-up, last visit was in January and since then he has not had any seizure or seizure-like activity.  He did have side effect of the Keppra, agitation and irritability, therefore Keppra discontinued and patient started on Briviact.  He reported not liking the feeling of the Briviact therefore he self discontinued the medication. Since being off Briviact, he has not had any seizure or seizure-like activity.  Overall he is doing well.  He has been in and out of the hospital due to pneumonia, pain.  He is latest CT scan on July 8 was within normal limits and his latest EEG is April was normal.    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  Wife found patient October 17 with generalized convulsion, foaming at the mouth lasted about a minute followed by generalized weakness and confusion.  EMS was called and patient taken to the hospital.  In the ED he was found to have a subdural hematoma with mass effect.  Neurosurgery will was consulted, he did not need evacuation but did have a MMA embolization.  He was started on Keppra 750 daily with extra 250 on dialysis today.  Since then wife has not reported any additional seizures.  He was  discharged in November.  Since being home, wife has reported some occasional confusion and staring spell but no seizures.  He still complains of chronic pain and has generalized weakness and malaise.   Handedness: Right handed   Onset: October 17   Seizure Type: Described as generalized convulsion   Current frequency: Only once   Any injuries from seizures: Denies   Seizure risk factors: Subdural Hematoma   Previous ASMs: None   Currenty ASMs: Levetiracetam 750 mg daily with extra 250 mg on dialysis day   ASMs side effects: Somnolence, fatigue   Brain Images: Resolution of the SDH   Previous EEGs: Generalized slowing with focal right hemispheric slowing    Hospital course below  [name] is a 62 y.o. 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 On exam on arrival to the ED he had new onset left-sided neglect and right forced gaze on 10/24 with resultant Code Stroke activation. Concern for seizure versus interval SDH bleeding with mild mass effect identified on CTH. Stroke was ruled out, with seizure due to subdural hematoma diagnosed at that time. Patient was initially started on Keppra then transitioned to Depakote due to headache.  VPA level was subtherapeutic 10/23. Keppra was preferred to VPA in setting of bleed and head trauma and also given his hx thrombocytopenia. Therefore 10/23 VPA was d/c'd (w/o taper since subtherapeutic) and he was reloaded with and continued on Keppra. Lyrica, also an AED, was also added for headache at ESRD dosing. He was transferred to the Rehab service on 10/31. Since then, he has had  recurrent spells managed with increased Keppra dosing to 750 mg QD (renally dosed) with 250 mg supplemental dosing on HD days. He had recurrence of spells again overnight 11/2-11/3. Neurology was re-consulted for repeat EEG and possible anticonvulsant medication titration.    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     amLODipine (NORVASC) 10 MG tablet Take 1 tablet (10 mg total) by mouth every evening.     aspirin 81 MG chewable tablet Chew 81 mg by mouth daily.     budesonide-formoterol (SYMBICORT) 160-4.5 MCG/ACT inhaler Inhale 2 puffs into the lungs as needed.     calcitRIOL (ROCALTROL) 0.5 MCG capsule Take 2 capsules (1 mcg total) by mouth every Monday, Wednesday, and Friday with hemodialysis. 60 capsule 1   Cholecalciferol (VITAMIN D3) 25 MCG (1000 UT) CAPS Take 4,000 Units by mouth daily.     cinacalcet (SENSIPAR) 30 MG tablet Take 2 tablets (60 mg total) by mouth 3 (three) times a week. 60 tablet    dextromethorphan-guaiFENesin (MUCINEX DM) 30-600 MG 12hr tablet Take 1 tablet by mouth 2 (two) times daily. 14 tablet 0   diphenhydrAMINE (BENADRYL) 25 mg capsule Take 25 mg by mouth every 6 (six) hours as needed for allergies.     doxazosin (CARDURA) 8 MG tablet Take 1 tablet (8 mg total) by mouth daily. 90 tablet 3   DULoxetine (CYMBALTA) 60 MG capsule  Take 1 capsule (60 mg total) by mouth daily. 90 capsule 1   ethyl chloride spray SMARTSIG:1 Spray(s) Topical 3 Times a Week     FOSRENOL 1000 MG PACK Take 1 packet by mouth 3 (three) times daily.     hydrALAZINE (APRESOLINE) 100 MG tablet Take 1 tablet (100 mg total) by mouth 3 (three) times daily. 270 tablet 3   isosorbide mononitrate (IMDUR) 120 MG 24 hr tablet Take 1 tablet (120 mg total) by mouth daily. 90 tablet 3   methocarbamol (ROBAXIN) 750 MG tablet Take 1 tablet (750 mg total) by mouth 4 (four) times daily.     metoprolol tartrate (LOPRESSOR) 100 MG tablet Take 1 tablet (100 mg total) by mouth 2 (two) times daily. 180 tablet 1   multivitamin (RENA-VIT) TABS tablet Take 1 tablet by mouth daily.  0   ondansetron (ZOFRAN-ODT) 4 MG disintegrating tablet TAKE 1 TABLET BY MOUTH EVERY 8 HOURS AS NEEDED FOR NAUSEA AND VOMITING 14 tablet 3   pantoprazole (PROTONIX) 40 MG tablet Take 1 tablet (40 mg total) by mouth 2 (  two) times daily before a meal.     pravastatin (PRAVACHOL) 40 MG tablet TAKE 1 TABLET BY MOUTH EVERY DAY IN THE EVENING 90 tablet 2   sevelamer carbonate (RENVELA) 800 MG tablet Take 2 tablets (1,600 mg total) by mouth as needed (snacks).     sevelamer carbonate (RENVELA) 800 MG tablet Take 4 tablets (3,200 mg total) by mouth 3 (three) times daily with meals.     sucralfate (CARAFATE) 1 GM/10ML suspension Take 10 mLs (1 g total) by mouth 4 (four) times daily -  with meals and at bedtime. 420 mL 0   traZODone (DESYREL) 50 MG tablet Take 0.5-1 tablets (25-50 mg total) by mouth at bedtime as needed. for sleep 90 tablet 1   No facility-administered medications prior to visit.    PAST MEDICAL HISTORY: 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)    Anxiety    Asthma    Asthma    moderate persistent   Carpal tunnel syndrome     Cellulitis and abscess of trunk    Cholelithiasis 07/13/2014   Chronic headaches    Debility, unspecified    Dermatophytosis of the body    Dysrhythmia    history of   Edema    End stage renal disease on dialysis (HCC)    "MWF; [facility]" (10/21/2014)   Essential hypertension, benign    GERD (gastroesophageal reflux disease)    Gout, unspecified    HTN (hypertension)    Hypertrophy of prostate without urinary obstruction and other lower urinary tract symptoms (LUTS)    Hypotension, unspecified    Impotence of organic origin    Insomnia, unspecified    Kidney replaced by transplant    Localization-related (focal) (partial) epilepsy and epileptic syndromes with complex partial seizures, without mention of intractable epilepsy    12-15-19- Wife states he has NEVER had a seizure    Lumbago    Memory loss    OSA on CPAP    Other and unspecified hyperlipidemia    controlled /managed per wife    Other chronic nonalcoholic liver disease    Other malaise and fatigue    Other nonspecific abnormal serum enzyme levels    Pain in joint, lower leg    Pain in joint, upper arm    Pneumonia "several times"   PONV (postoperative nausea and vomiting)    Renal dialysis status(V45.11) 02/05/2010   restarted 01/02/13 ofter renal trransplant failure   Secondary hyperparathyroidism (of renal origin)    Shortness of breath    Sleep apnea    wears cpap    Tension headache    Unspecified constipation    Unspecified essential hypertension    Unspecified hereditary and idiopathic peripheral neuropathy    Unspecified vitamin D deficiency     PAST SURGICAL HISTORY: Past Surgical History:  Procedure Laterality Date   AV FISTULA PLACEMENT Left ?2010   "forearm; at [facility]"   BACK SURGERY     CARDIAC CATHETERIZATION  03/21/2011   CHOLECYSTECTOMY N/A 10/21/2014   Procedure: LAPAROSCOPIC CHOLECYSTECTOMY WITH INTRAOPERATIVE CHOLANGIOGRAM;  Surgeon: Chevis Pretty III,  MD;  Location: [facility] OR;  Service: General;  Laterality: N/A;   COLONOSCOPY     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   IR ANGIO INTRA EXTRACRAN SEL INTERNAL CAROTID  UNI R MOD SED  11/30/2021   IR ANGIOGRAM FOLLOW UP STUDY  11/30/2021   IR BONE MARROW BIOPSY & ASPIRATION  03/27/2022   IR NEURO EACH ADD'L AFTER BASIC UNI RIGHT (MS)  11/30/2021   IR TRANSCATH/EMBOLIZ  11/30/2021   KIDNEY TRANSPLANT  08/17/2011   [facility]    LAPAROSCOPIC CHOLECYSTECTOMY  10/21/2014   w/IOC   LEFT HEART CATHETERIZATION WITH CORONARY ANGIOGRAM N/A 03/21/2011   Procedure: LEFT HEART CATHETERIZATION WITH CORONARY ANGIOGRAM;  Surgeon: Chrystie Nose, MD;  Location: [facility] CATH LAB;  Service: Cardiovascular;  Laterality: N/A;   NEPHRECTOMY  08/2013   removed transplaned kidney   POLYPECTOMY     POSTERIOR FUSION CERVICAL SPINE  06/25/2012   for spinal stenosis   RADIOLOGY WITH ANESTHESIA N/A 11/30/2021   Procedure: RIGHT MIDDLE MENINGEAL ARTERY EMBOLIZATION;  Surgeon: Radiologist, Medication, MD;  Location: [facility] OR;  Service: Radiology;  Laterality: N/A;   VASECTOMY  2010    FAMILY HISTORY: Family History  Adopted: Yes  Problem Relation Age of Onset   Colon cancer Neg Hx    Esophageal cancer Neg Hx    Rectal cancer Neg Hx    Stomach cancer Neg Hx    Colon polyps Neg Hx     SOCIAL HISTORY: Social History   Socioeconomic History   Marital status: Married    Spouse name: Chyrl Civatte   Number of children: 3   Years of education: Not on file   Highest education level: Not on file  Occupational History   Occupation: disabled    Employer: DISABLED  Tobacco Use   Smoking status: Former    Current packs/day: 0.00    Average packs/day: 0.5 packs/day for 32.0 years (16.0 ttl pk-yrs)    Types: Cigarettes    Start date: 63    Quit date: 02/24/2020    Years since quitting: 2.4   Smokeless tobacco: Never  Vaping Use   Vaping status: Never Used  Substance and Sexual Activity   Alcohol use: No     Alcohol/week: 0.0 standard drinks of alcohol   Drug use: No   Sexual activity: Yes  Other Topics Concern   Not on file  Social History Narrative   Drinks 1 cup of caffeine daily.   Social Determinants of Health   Financial Resource Strain: Medium Risk (11/02/2021)   Overall Financial Resource Strain (CARDIA)    Difficulty of Paying Living Expenses: Somewhat hard  Food Insecurity: No Food Insecurity (04/29/2022)   Hunger Vital Sign    Worried About Running Out of Food in the Last Year: Never true    Ran Out of Food in the Last Year: Never true  Transportation Needs: No Transportation Needs (04/29/2022)   PRAPARE - Administrator, Civil Service (Medical): No    Lack of Transportation (Non-Medical): No  Physical Activity: Insufficiently Active (06/25/2017)   Exercise Vital Sign    Days of Exercise per Week: 3 days    Minutes of Exercise per Session: 20 min  Stress: No Stress Concern Present (06/25/2017)   [employer] of Occupational Health - Occupational Stress Questionnaire    Feeling of Stress : Only a little  Social Connections: Moderately Integrated (06/25/2017)   Social Connection and Isolation Panel [NHANES]    Frequency of Communication with Friends and Family: More than three times a week    Frequency of Social Gatherings with Friends and Family: More than three times a week    Attends Religious Services: More than 4  times per year    Active Member of Clubs or Organizations: No    Attends Banker Meetings: Never    Marital Status: Married  Catering manager Violence: Not At Risk (04/29/2022)   Humiliation, Afraid, Rape, and Kick questionnaire    Fear of Current or Ex-Partner: No    Emotionally Abused: No    Physically Abused: No    Sexually Abused: No     PHYSICAL EXAM  GENERAL EXAM/CONSTITUTIONAL: Vitals:  Vitals:   08/23/22 0954  BP: (!) 177/77  Pulse: 69  Weight: 90.7 kg  Height: 5\' 7"  (1.702 m)    Body mass index is 31.32 kg/m. Wt  Readings from Last 3 Encounters:  08/23/22 90.7 kg  07/24/22 85.8 kg  06/28/22 87.1 kg   Patient is in no distress; well developed, nourished and groomed; neck is supple  MUSCULOSKELETAL: Gait, strength, tone, movements noted in Neurologic exam below  NEUROLOGIC: MENTAL STATUS:     09/12/2021   11:27 AM  MMSE - Mini Mental State Exam  Orientation to time 4  Orientation to time comments year 2023, season summer, date N/A, day Tuesday, month August.  Orientation to Place 3  Orientation to Place-comments state [place], county [place], city [place], facility N/A, provider N/A.  Registration 3  Attention/ Calculation 2  Attention/Calculation-comments DL  Recall 3  Language- name 2 objects 2  Language- repeat 0  Language- follow 3 step command 3  Language- read & follow direction 1  Write a sentence 0  Write a sentence-comments I need to go finish today OK.  Copy design 1  Total score 22   awake, alert,   Able to name, repeat and comprehend  Able to calculate the number of quarter in $1,75 but unable to spell EMPTY Mild psychomotor retardation   CRANIAL NERVE:  2nd, 3rd, 4th, 6th - Visual fields full to confrontation, extraocular muscles intact, no nystagmus 5th - facial sensation symmetric 7th - facial strength symmetric 8th - hearing intact 9th - palate elevates symmetrically, uvula midline 11th - shoulder shrug symmetric 12th - tongue protrusion midline  MOTOR:  normal bulk and tone, at least antigravity but limited to pain  SENSORY:  normal and symmetric to light touch  COORDINATION:  finger-nose-finger  GAIT/STATION:  Need assistance to stand and with ambulation. Antalgic gait.    DIAGNOSTIC DATA (LABS, IMAGING, TESTING) - I reviewed patient records, labs, notes, testing and imaging myself where available.  Lab Results  Component Value Date   WBC 3.8 (L) 07/30/2022   HGB 10.8 (L) 07/30/2022   HCT 32.8 (L) 07/30/2022   MCV 97.9 07/30/2022   PLT 83 (L)  07/30/2022      Component Value Date/Time   NA 135 07/30/2022 1249   NA 136 11/26/2013 0914   K 4.5 07/30/2022 1249   CL 95 (L) 07/30/2022 1249   CO2 26 07/30/2022 1249   GLUCOSE 104 (H) 07/30/2022 1249   BUN 54 (H) 07/30/2022 1249   BUN 66 (A) 05/25/2015 0000   BUN 66 (A) 05/25/2015 0000   CREATININE 10.20 (H) 07/30/2022 1249   CREATININE 9.87 (HH) 07/24/2022 1432   CREATININE 8.37 (H) 02/03/2018 1523   CALCIUM 8.6 (L) 07/30/2022 1249   CALCIUM 8.8 06/08/2015 0000   CALCIUM 7.9 (L) 01/19/2010 1016   PROT 6.5 07/24/2022 1432   PROT 6.7 08/27/2013 1351   ALBUMIN 3.9 07/24/2022 1432   ALBUMIN 3.6 08/27/2013 1351   AST 12 (L) 07/24/2022 1432   ALT 9 07/24/2022  1432   ALKPHOS 82 07/24/2022 1432   BILITOT 0.6 07/24/2022 1432   GFRNONAA 5 (L) 07/30/2022 1249   GFRNONAA 6 (L) 07/24/2022 1432   GFRAA 4 (L) 07/15/2019 0704   Lab Results  Component Value Date   CHOL 114 05/05/2022   HDL 41 05/05/2022   LDLCALC 56 05/05/2022   TRIG 86 05/05/2022   Lab Results  Component Value Date   HGBA1C 5.3 05/22/2022   Lab Results  Component Value Date   VITAMINB12 980 (H) 07/24/2022   Lab Results  Component Value Date   TSH 2.245 11/16/2021    CT Head 07/30/2022:  1. No acute intracranial abnormality.    CT Head 02/05/2022 1. Previously noted mixed density right subdural hematoma is essentially resolved. There is trace residual dural thickening versus minimal residual right convexity collection measuring 1-2 mm maximum thickness. No acute hemorrhage is seen. 2. Atrophy and minimal chronic small vessel ischemic changes of the white matter.   CT Head 11/07/2021 Acute subdural hematoma overlying the right frontal convexity measuring 9 mm in greatest radial diameter. Mild adjacent mass effect and 1-2 mm of leftward midline shift at the septum pellucidum.   EEG 11/24/2021 - Continuous slow, right parieto-occipital region   EEG 04/30/2022 -This study is within normal limits. No  seizures or epileptiform discharges were seen throughout the recording.    IMPRESSION: This study is suggestive of cortical dysfunction arising from right parieto-occipital region likely secondary to underlying SDH. No seizures or definite epileptiform discharges were seen throughout the recording.   I personally reviewed brain Images and previous EEG reports.   ASSESSMENT AND PLAN  62 y.o. 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  He is doing well since last visit in January.  No seizure or seizure-like DVT.  He was not able to tolerate Keppra or Briviact therefore discontinued.  He has been off medication for about 2 months now and no seizures.  His most recent head CT is within normal limits and his EEG was negative. Plan is to continue observing patient off medication and wife understands to contact me if he did have a breakthrough seizure.  Continue to follow with PCP and return as needed.    1. Seizures (HCC)   2. Subdural hemorrhage (HCC)   3. Psychomotor delay      Patient Instructions  Continue current medications Continue to follow with PCP Return as needed     Per [facility] statutes, patients with seizures are not allowed to drive until they have been seizure-free for six months.  Other recommendations include using caution when using heavy equipment or power tools. Avoid working on ladders or at heights. Take showers instead of baths.  Do not swim alone.  Ensure the water temperature is not too high on the home water heater. Do not go swimming alone. Do not lock yourself in a room alone (i.e. bathroom). When caring for infants or small children, sit  down when holding, feeding, or changing them to minimize risk of injury to the child in the event you have a seizure. Maintain good sleep hygiene. Avoid alcohol.  Also recommend adequate sleep, hydration, good diet and minimize stress.   During  the Seizure  - First, ensure adequate ventilation and place patients on the floor on their left side  Loosen clothing around the neck and ensure the airway is patent. If the patient is clenching the teeth, do not force the mouth open with any object as this can cause severe damage - Remove all items from the surrounding that can be hazardous. The patient may be oblivious to what's happening and may not even know what he or she is doing. If the patient is confused and wandering, either gently guide him/her away and block access to outside areas - Reassure the individual and be comforting - Call 911. In most cases, the seizure ends before EMS arrives. However, there are cases when seizures may last over 3 to 5 minutes. Or the individual may have developed breathing difficulties or severe injuries. If a pregnant patient or a person with diabetes develops a seizure, it is prudent to call an ambulance. - Finally, if the patient does not regain full consciousness, then call EMS. Most patients will remain confused for about 45 to 90 minutes after a seizure, so you must use judgment in calling for help. - Avoid restraints but make sure the patient is in a bed with padded side rails - Place the individual in a lateral position with the neck slightly flexed; this will help the saliva drain from the mouth and prevent the tongue from falling backward - Remove all nearby furniture and other hazards from the area - Provide verbal assurance as the individual is regaining consciousness - Provide the patient with privacy if possible - Call for help and start treatment as ordered by the caregiver   After the Seizure (Postictal Stage)  After a seizure, most patients  experience confusion, fatigue, muscle pain and/or a headache. Thus, one should permit the individual to sleep. For the next few days, reassurance is essential. Being calm and helping reorient the person is also of importance.  Most seizures are painless and end spontaneously. Seizures are not harmful to others but can lead to complications such as stress on the lungs, brain and the heart. Individuals with prior lung problems may develop labored breathing and respiratory distress.     No orders of the defined types were placed in this encounter.   No orders of the defined types were placed in this encounter.   Return if symptoms worsen or fail to improve.    Windell Norfolk, MD 08/23/2022, 10:27 AM  [facility] [address] [place], [place] [address] [phone]

## 2022-08-24 DIAGNOSIS — Z992 Dependence on renal dialysis: Secondary | ICD-10-CM | POA: Diagnosis not present

## 2022-08-24 DIAGNOSIS — N2581 Secondary hyperparathyroidism of renal origin: Secondary | ICD-10-CM | POA: Diagnosis not present

## 2022-08-24 DIAGNOSIS — D631 Anemia in chronic kidney disease: Secondary | ICD-10-CM | POA: Diagnosis not present

## 2022-08-24 DIAGNOSIS — N186 End stage renal disease: Secondary | ICD-10-CM | POA: Diagnosis not present

## 2022-08-27 ENCOUNTER — Ambulatory Visit: Payer: Self-pay

## 2022-08-27 DIAGNOSIS — Z992 Dependence on renal dialysis: Secondary | ICD-10-CM | POA: Diagnosis not present

## 2022-08-27 DIAGNOSIS — D631 Anemia in chronic kidney disease: Secondary | ICD-10-CM | POA: Diagnosis not present

## 2022-08-27 DIAGNOSIS — N186 End stage renal disease: Secondary | ICD-10-CM | POA: Diagnosis not present

## 2022-08-27 DIAGNOSIS — N2581 Secondary hyperparathyroidism of renal origin: Secondary | ICD-10-CM | POA: Diagnosis not present

## 2022-08-28 NOTE — Patient Outreach (Signed)
  Care Coordination   Follow Up Visit Note   08/27/2022 Name: Kirk [name] MRN: [id number] DOB: 01/02/1961  Kirk [name] is a 62 y.o. year old male who sees Ngetich, Dinah C, NP for primary care. I spoke with  Kirk [name] by phone today.  What matters to the patients health and wellness today?  Patient would like to improve his sleep and depression.     Goals Addressed             This Visit's Progress    RN Care Coordination Activities       Care Coordination Interventions: Evaluation of current treatment plan related to syncope and patient's adherence to plan as established by provider Determined patient experienced a fall while in the bathroom during his dialysis treatment resulting in an ED visit Discussed with wife, patient has experienced weight gain, that she feels is body weight not fluid, his weight is up 11 lbs since April Educated wife regarding change in body weight verses fluid weight that may result in hypovolemia Encouraged wife to discuss this further with his kidney specialist managing ESRD to consider a dry weight change and wife verbalizes understanding      COMPLETED: To follow up with Neurology concerning SE to seizure meds       Care Coordination Interventions: Completed successful outbound call with wife Kirk [name]  Evaluation of current treatment plan related to Seizures and patient's adherence to plan as established by provider Confirmed patient completed his Neurology follow up with Dr. Teresa Coombs Review of patient status, including review of consultant's reports, relevant laboratory and other test results, and medications completed Discussed with wife, patient continues to stay Seizure free and will return to Dr. 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 depression  and patient's adherence to  plan as established by provider Reviewed upcoming scheduled follow up for initial consultation with Psychiatry scheduled for 08/30/22 for evaluation of worsening depression and insomnia  Discussed patient's wife will accompany this appointment with patient      Interventions Today    Flowsheet Row Most Recent Value  Chronic Disease   Chronic disease during today's visit Chronic Kidney Disease/End Stage Renal Disease (ESRD), Other  [Syncope,  Seizures]  General Interventions   General Interventions Discussed/Reviewed General Interventions Discussed, General Interventions Reviewed, Doctor Visits  Doctor Visits Discussed/Reviewed Doctor Visits Reviewed, Doctor Visits Discussed, Specialist  Education Interventions   Education Provided Provided Education  Provided Verbal Education On Mental Health/Coping with Illness  Mental Health Interventions   Mental Health Discussed/Reviewed Mental Health Discussed, Mental Health Reviewed, Other  [insomnia]  Pharmacy Interventions   Pharmacy Dicussed/Reviewed Pharmacy Topics Discussed, Pharmacy Topics Reviewed, Medications and their functions  Safety Interventions   Safety Discussed/Reviewed Safety Discussed, Safety Reviewed, Fall Risk, Home Safety  Home Safety Assistive Devices          SDOH assessments and interventions completed:  No     Care Coordination Interventions:  Yes, provided   Follow up plan: Follow up call scheduled for 10/04/22 12:00 PM     Encounter Outcome:  Pt. Visit Completed

## 2022-08-28 NOTE — Patient Instructions (Signed)
Visit Information  Thank you for taking time to visit with me today. Please don't hesitate to contact me if I can be of assistance to you.   Following are the goals we discussed today:   Goals Addressed             This Visit's Progress    RN Care Coordination Activities       Care Coordination Interventions: Evaluation of current treatment plan related to syncope and patient's adherence to plan as established by provider Determined patient experienced a fall while in the bathroom during his dialysis treatment resulting in an ED visit Discussed with wife, patient has experienced weight gain, that she feels is body weight not fluid, his weight is up 11 lbs since April Educated wife regarding change in body weight verses fluid weight that may result in hypovolemia Encouraged wife to discuss this further with his kidney specialist managing ESRD to consider a dry weight change and wife verbalizes understanding       COMPLETED: To follow up with Neurology concerning SE to seizure meds       Care Coordination Interventions: Completed successful outbound call with wife Randa Evens  Evaluation of current treatment plan related to Seizures and patient's adherence to plan as established by provider Confirmed patient completed his Neurology follow up with Dr. Teresa Coombs Review of patient status, including review of consultant's reports, relevant laboratory and other test results, and medications completed Discussed with wife, patient continues to stay Seizure free and will return to Dr. Teresa Coombs as needed     To follow up with Psychiatry       Care Coordination Interventions: Completed successful outbound call with wife Randa Evens Evaluation of current treatment plan related to sleep disorder and depression  and patient's adherence to plan as established by provider Reviewed upcoming scheduled follow up for initial consultation with Psychiatry scheduled for 08/30/22 for evaluation of worsening depression and  insomnia  Discussed patient's wife will accompany this appointment with patient          Our next appointment is by telephone on 10/04/22 at 12 PM  Please call the care guide team at [phone] if you need to cancel or reschedule your appointment.   If you are experiencing a Mental Health or Behavioral Health Crisis or need someone to talk to, please call 1-800-273-TALK (toll free, 24 hour hotline)  Patient verbalizes understanding of instructions and care plan provided today and agrees to view in MyChart. Active MyChart status and patient understanding of how to access instructions and care plan via MyChart confirmed with patient.     Delsa Sale, RN, BSN, CCM Care Management Coordinator [facility]  Direct Phone: [phone]

## 2022-08-29 DIAGNOSIS — N186 End stage renal disease: Secondary | ICD-10-CM | POA: Diagnosis not present

## 2022-08-29 DIAGNOSIS — Z992 Dependence on renal dialysis: Secondary | ICD-10-CM | POA: Diagnosis not present

## 2022-08-29 DIAGNOSIS — N2581 Secondary hyperparathyroidism of renal origin: Secondary | ICD-10-CM | POA: Diagnosis not present

## 2022-08-29 DIAGNOSIS — D631 Anemia in chronic kidney disease: Secondary | ICD-10-CM | POA: Diagnosis not present

## 2022-08-30 DIAGNOSIS — Z79899 Other long term (current) drug therapy: Secondary | ICD-10-CM | POA: Diagnosis not present

## 2022-08-30 DIAGNOSIS — G8929 Other chronic pain: Secondary | ICD-10-CM | POA: Diagnosis not present

## 2022-08-30 DIAGNOSIS — E6609 Other obesity due to excess calories: Secondary | ICD-10-CM | POA: Diagnosis not present

## 2022-08-30 DIAGNOSIS — M545 Low back pain, unspecified: Secondary | ICD-10-CM | POA: Diagnosis not present

## 2022-08-30 DIAGNOSIS — Z6830 Body mass index (BMI) 30.0-30.9, adult: Secondary | ICD-10-CM | POA: Diagnosis not present

## 2022-08-30 DIAGNOSIS — E612 Magnesium deficiency: Secondary | ICD-10-CM | POA: Diagnosis not present

## 2022-08-30 DIAGNOSIS — I10 Essential (primary) hypertension: Secondary | ICD-10-CM | POA: Diagnosis not present

## 2022-08-30 DIAGNOSIS — E559 Vitamin D deficiency, unspecified: Secondary | ICD-10-CM | POA: Diagnosis not present

## 2022-08-30 DIAGNOSIS — E7800 Pure hypercholesterolemia, unspecified: Secondary | ICD-10-CM | POA: Diagnosis not present

## 2022-08-30 DIAGNOSIS — R5383 Other fatigue: Secondary | ICD-10-CM | POA: Diagnosis not present

## 2022-08-30 DIAGNOSIS — I509 Heart failure, unspecified: Secondary | ICD-10-CM | POA: Diagnosis not present

## 2022-08-31 DIAGNOSIS — N186 End stage renal disease: Secondary | ICD-10-CM | POA: Diagnosis not present

## 2022-08-31 DIAGNOSIS — Z992 Dependence on renal dialysis: Secondary | ICD-10-CM | POA: Diagnosis not present

## 2022-08-31 DIAGNOSIS — D631 Anemia in chronic kidney disease: Secondary | ICD-10-CM | POA: Diagnosis not present

## 2022-08-31 DIAGNOSIS — N2581 Secondary hyperparathyroidism of renal origin: Secondary | ICD-10-CM | POA: Diagnosis not present

## 2022-09-03 DIAGNOSIS — N2581 Secondary hyperparathyroidism of renal origin: Secondary | ICD-10-CM | POA: Diagnosis not present

## 2022-09-03 DIAGNOSIS — D631 Anemia in chronic kidney disease: Secondary | ICD-10-CM | POA: Diagnosis not present

## 2022-09-03 DIAGNOSIS — Z992 Dependence on renal dialysis: Secondary | ICD-10-CM | POA: Diagnosis not present

## 2022-09-03 DIAGNOSIS — N186 End stage renal disease: Secondary | ICD-10-CM | POA: Diagnosis not present

## 2022-09-05 ENCOUNTER — Other Ambulatory Visit: Payer: Self-pay | Admitting: Family

## 2022-09-05 DIAGNOSIS — D631 Anemia in chronic kidney disease: Secondary | ICD-10-CM | POA: Diagnosis not present

## 2022-09-05 DIAGNOSIS — I10 Essential (primary) hypertension: Secondary | ICD-10-CM

## 2022-09-05 DIAGNOSIS — N2581 Secondary hyperparathyroidism of renal origin: Secondary | ICD-10-CM | POA: Diagnosis not present

## 2022-09-05 DIAGNOSIS — N186 End stage renal disease: Secondary | ICD-10-CM | POA: Diagnosis not present

## 2022-09-05 DIAGNOSIS — Z992 Dependence on renal dialysis: Secondary | ICD-10-CM | POA: Diagnosis not present

## 2022-09-07 ENCOUNTER — Other Ambulatory Visit: Payer: Self-pay | Admitting: Family

## 2022-09-07 DIAGNOSIS — N186 End stage renal disease: Secondary | ICD-10-CM | POA: Diagnosis not present

## 2022-09-07 DIAGNOSIS — D631 Anemia in chronic kidney disease: Secondary | ICD-10-CM | POA: Diagnosis not present

## 2022-09-07 DIAGNOSIS — N2581 Secondary hyperparathyroidism of renal origin: Secondary | ICD-10-CM | POA: Diagnosis not present

## 2022-09-07 DIAGNOSIS — Z992 Dependence on renal dialysis: Secondary | ICD-10-CM | POA: Diagnosis not present

## 2022-09-10 DIAGNOSIS — N2581 Secondary hyperparathyroidism of renal origin: Secondary | ICD-10-CM | POA: Diagnosis not present

## 2022-09-10 DIAGNOSIS — D631 Anemia in chronic kidney disease: Secondary | ICD-10-CM | POA: Diagnosis not present

## 2022-09-10 DIAGNOSIS — N186 End stage renal disease: Secondary | ICD-10-CM | POA: Diagnosis not present

## 2022-09-10 DIAGNOSIS — Z992 Dependence on renal dialysis: Secondary | ICD-10-CM | POA: Diagnosis not present

## 2022-09-12 ENCOUNTER — Encounter (HOSPITAL_COMMUNITY): Payer: Self-pay | Admitting: Student

## 2022-09-12 ENCOUNTER — Ambulatory Visit (HOSPITAL_BASED_OUTPATIENT_CLINIC_OR_DEPARTMENT_OTHER): Payer: Medicare Other | Admitting: Student

## 2022-09-12 VITALS — BP 162/70 | HR 70 | Wt 195.2 lb

## 2022-09-12 DIAGNOSIS — D631 Anemia in chronic kidney disease: Secondary | ICD-10-CM | POA: Diagnosis not present

## 2022-09-12 DIAGNOSIS — Z992 Dependence on renal dialysis: Secondary | ICD-10-CM | POA: Diagnosis not present

## 2022-09-12 DIAGNOSIS — G4733 Obstructive sleep apnea (adult) (pediatric): Secondary | ICD-10-CM

## 2022-09-12 DIAGNOSIS — F331 Major depressive disorder, recurrent, moderate: Secondary | ICD-10-CM | POA: Diagnosis not present

## 2022-09-12 DIAGNOSIS — N186 End stage renal disease: Secondary | ICD-10-CM | POA: Diagnosis not present

## 2022-09-12 DIAGNOSIS — F4310 Post-traumatic stress disorder, unspecified: Secondary | ICD-10-CM | POA: Diagnosis not present

## 2022-09-12 DIAGNOSIS — N2581 Secondary hyperparathyroidism of renal origin: Secondary | ICD-10-CM | POA: Diagnosis not present

## 2022-09-12 MED ORDER — VORTIOXETINE HBR 5 MG PO TABS
5.0000 mg | ORAL_TABLET | Freq: Every day | ORAL | 1 refills | Status: AC
Start: 2022-09-12 — End: 2022-11-11
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
## 2022-09-14 DIAGNOSIS — N186 End stage renal disease: Secondary | ICD-10-CM | POA: Diagnosis not present

## 2022-09-14 DIAGNOSIS — D631 Anemia in chronic kidney disease: Secondary | ICD-10-CM | POA: Diagnosis not present

## 2022-09-14 DIAGNOSIS — N2581 Secondary hyperparathyroidism of renal origin: Secondary | ICD-10-CM | POA: Diagnosis not present

## 2022-09-14 DIAGNOSIS — Z992 Dependence on renal dialysis: Secondary | ICD-10-CM | POA: Diagnosis not present

## 2022-09-14 NOTE — Addendum Note (Signed)
Addended by: Everlena Cooper on: 09/14/2022 08:31 AM   Modules accepted: Level of Service

## 2022-09-17 DIAGNOSIS — Z992 Dependence on renal dialysis: Secondary | ICD-10-CM | POA: Diagnosis not present

## 2022-09-17 DIAGNOSIS — D631 Anemia in chronic kidney disease: Secondary | ICD-10-CM | POA: Diagnosis not present

## 2022-09-17 DIAGNOSIS — N2581 Secondary hyperparathyroidism of renal origin: Secondary | ICD-10-CM | POA: Diagnosis not present

## 2022-09-17 DIAGNOSIS — N186 End stage renal disease: Secondary | ICD-10-CM | POA: Diagnosis not present

## 2022-09-18 ENCOUNTER — Ambulatory Visit (INDEPENDENT_AMBULATORY_CARE_PROVIDER_SITE_OTHER): Payer: Medicare Other | Admitting: Family

## 2022-09-18 ENCOUNTER — Encounter: Payer: Self-pay | Admitting: Primary Care

## 2022-09-18 ENCOUNTER — Encounter: Payer: Self-pay | Admitting: Family

## 2022-09-18 ENCOUNTER — Ambulatory Visit (INDEPENDENT_AMBULATORY_CARE_PROVIDER_SITE_OTHER): Payer: Medicare Other | Admitting: Primary Care

## 2022-09-18 VITALS — BP 132/56 | HR 66 | Temp 97.6°F | Ht 67.0 in | Wt 196.0 lb

## 2022-09-18 VITALS — BP 122/58 | HR 67 | Temp 98.7°F | Ht 67.0 in | Wt 196.0 lb

## 2022-09-18 DIAGNOSIS — J209 Acute bronchitis, unspecified: Secondary | ICD-10-CM

## 2022-09-18 DIAGNOSIS — J310 Chronic rhinitis: Secondary | ICD-10-CM | POA: Insufficient documentation

## 2022-09-18 DIAGNOSIS — J4530 Mild persistent asthma, uncomplicated: Secondary | ICD-10-CM

## 2022-09-18 DIAGNOSIS — J42 Unspecified chronic bronchitis: Secondary | ICD-10-CM | POA: Diagnosis not present

## 2022-09-18 DIAGNOSIS — Z0000 Encounter for general adult medical examination without abnormal findings: Secondary | ICD-10-CM | POA: Diagnosis not present

## 2022-09-18 DIAGNOSIS — J302 Other seasonal allergic rhinitis: Secondary | ICD-10-CM

## 2022-09-18 MED ORDER — BUDESONIDE-FORMOTEROL FUMARATE 160-4.5 MCG/ACT IN AERO: 2.0000 | INHALATION_SPRAY | RESPIRATORY_TRACT | 5 refills | Status: DC | PRN

## 2022-09-18 MED ORDER — GUAIFENESIN-DM 100-10 MG/5ML PO SYRP: 5.0000 mL | ORAL_SOLUTION | ORAL | 0 refills | Status: DC | PRN

## 2022-09-18 MED ORDER — DOXYCYCLINE HYCLATE 100 MG PO TABS: 100.0000 mg | ORAL_TABLET | Freq: Two times a day (BID) | ORAL | 0 refills | Status: DC

## 2022-09-18 MED ORDER — AZELASTINE HCL 0.1 % NA SOLN: 1.0000 | Freq: Two times a day (BID) | NASAL | 1 refills | Status: DC

## 2022-09-18 NOTE — Assessment & Plan Note (Signed)
-   Treating for acute exacerbation chronic bronchitis. Rx Doxycycline 100mg  BID a 10 days. Advised patient take robitussin-dm 5ml q 4-6 hours for cough and use flutter valve three times a day. FU in 6 weeks to ensure improvement.

## 2022-09-18 NOTE — Progress Notes (Signed)
Subjective:   Kirk [name] is a 62 y.o. male who presents for Medicare Annual/Subsequent preventive examination.  Visit Complete: In person  Patient Medicare AWV questionnaire was completed by the patient on 09/18/2022; I have confirmed that all information answered by patient is correct and no changes since this date.  Review of Systems     Cardiac Risk Factors include: advanced age (>105men, >28 women);male gender;hypertension;dyslipidemia;obesity (BMI >30kg/m2);smoking/ tobacco exposure     Objective:    Today's Vitals   09/18/22 0804 09/18/22 0916  BP: (!) 132/56   Pulse: 66   Temp: 97.6 F (36.4 C)   SpO2: 92%   Weight: 196 lb (88.9 kg)   Height: 5\' 7"  (1.702 m)   PainSc:  9    Body mass index is 30.7 kg/m.     09/18/2022    8:06 AM 07/30/2022   10:26 AM 07/24/2022    2:43 PM 04/29/2022    3:01 AM 04/28/2022    7:38 PM 03/27/2022    7:10 AM 03/06/2022    1:21 PM  Advanced Directives  Does Patient Have a Medical Advance Directive? No No No  No No No  Would patient like information on creating a medical advance directive? No - Patient declined  No - Patient declined No - Patient declined No - Patient declined No - Patient declined No - Patient declined    Current Medications (verified) Outpatient Encounter Medications as of 09/18/2022  Medication Sig   acetaminophen (TYLENOL) 500 MG tablet Take 1 tablet (500 mg total) by mouth every 6 (six) hours as needed for moderate pain.   amLODipine (NORVASC) 10 MG tablet Take 1 tablet (10 mg total) by mouth every evening.   aspirin 81 MG chewable tablet Chew 81 mg by mouth daily.   budesonide-formoterol (SYMBICORT) 160-4.5 MCG/ACT inhaler Inhale 2 puffs into the lungs as needed.   calcitRIOL (ROCALTROL) 0.5 MCG capsule Take 2 capsules (1 mcg total) by mouth every Monday, Wednesday, and Friday with hemodialysis.   Cholecalciferol (VITAMIN D3) 25 MCG (1000 UT) CAPS Take 4,000 Units by mouth daily.   cinacalcet (SENSIPAR) 30 MG tablet  Take 2 tablets (60 mg total) by mouth 3 (three) times a week.   dextromethorphan-guaiFENesin (MUCINEX DM) 30-600 MG 12hr tablet Take 1 tablet by mouth 2 (two) times daily.   diphenhydrAMINE (BENADRYL) 25 mg capsule Take 25 mg by mouth every 6 (six) hours as needed for allergies.   doxazosin (CARDURA) 8 MG tablet Take 1 tablet (8 mg total) by mouth daily.   ethyl chloride spray SMARTSIG:1 Spray(s) Topical 3 Times a Week   FOSRENOL 1000 MG PACK Take 1 packet by mouth 3 (three) times daily.   hydrALAZINE (APRESOLINE) 100 MG tablet Take 1 tablet (100 mg total) by mouth 3 (three) times daily.   isosorbide mononitrate (IMDUR) 120 MG 24 hr tablet Take 1 tablet (120 mg total) by mouth daily.   methocarbamol (ROBAXIN) 750 MG tablet Take 1 tablet (750 mg total) by mouth 4 (four) times daily.   metoprolol tartrate (LOPRESSOR) 100 MG tablet TAKE 1 TABLET BY MOUTH TWICE A DAY   multivitamin (RENA-VIT) TABS tablet Take 1 tablet by mouth daily.   ondansetron (ZOFRAN-ODT) 4 MG disintegrating tablet TAKE 1 TABLET BY MOUTH EVERY 8 HOURS AS NEEDED FOR NAUSEA AND VOMITING   pantoprazole (PROTONIX) 40 MG tablet Take 1 tablet (40 mg total) by mouth 2 (two) times daily before a meal.   pravastatin (PRAVACHOL) 40 MG tablet TAKE 1 TABLET  BY MOUTH EVERY DAY IN THE EVENING   sevelamer carbonate (RENVELA) 800 MG tablet Take 2 tablets (1,600 mg total) by mouth as needed (snacks).   sevelamer carbonate (RENVELA) 800 MG tablet Take 4 tablets (3,200 mg total) by mouth 3 (three) times daily with meals.   sucralfate (CARAFATE) 1 GM/10ML suspension Take 10 mLs (1 g total) by mouth 4 (four) times daily -  with meals and at bedtime.   traZODone (DESYREL) 50 MG tablet Take 0.5-1 tablets (25-50 mg total) by mouth at bedtime as needed. for sleep   vortioxetine HBr (TRINTELLIX) 5 MG TABS tablet Take 1 tablet (5 mg total) by mouth daily.   No facility-administered encounter medications on file as of 09/18/2022.    Allergies  (verified) Codeine and Hydrocodone-acetaminophen   History: 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)    Anxiety    Asthma    Asthma    moderate persistent   Carpal tunnel syndrome    Cellulitis and abscess of trunk    Cholelithiasis 07/13/2014   Chronic headaches    Cigarette smoker 07/11/2017   Stopped 2/022  - referred to start Lung cancer screening 04/2021         Debility, unspecified    Dermatophytosis of the body    Dysrhythmia    history of   Edema    End stage renal disease on dialysis (HCC)    "MWF; [facility] in [place]" (10/21/2014)   Essential hypertension, benign    Generalized anxiety disorder 05/25/2022   GERD (gastroesophageal reflux disease)    Gout, unspecified    HTN (hypertension)    Hypertrophy of prostate without urinary obstruction and other lower urinary tract symptoms (LUTS)    Hypotension, unspecified    Impotence of organic origin    Insomnia, unspecified    Kidney replaced by transplant    Localization-related (focal) (partial) epilepsy and epileptic syndromes with complex partial seizures, without mention of intractable epilepsy    12-15-19- Wife states he has NEVER had a seizure    Lumbago    Memory loss    OSA on CPAP    Other and unspecified hyperlipidemia    controlled /managed per wife    Other chronic nonalcoholic liver disease    Other malaise and fatigue    Other nonspecific abnormal serum enzyme levels    Pain in joint, lower leg    Pain in joint, upper arm    Pneumonia "several times"   PONV (postoperative nausea and vomiting)    Renal dialysis status(V45.11) 02/05/2010   restarted 01/02/13 ofter renal trransplant failure   Secondary hyperparathyroidism (of renal origin)    Shortness of breath    Sleep apnea    wears cpap    Tension headache    Unspecified  constipation    Unspecified essential hypertension    Unspecified hereditary and idiopathic peripheral neuropathy    Unspecified vitamin D deficiency    Past Surgical History:  Procedure Laterality Date   AV FISTULA PLACEMENT Left ?2010   "forearm; at [facility]"   BACK SURGERY     CARDIAC CATHETERIZATION  03/21/2011   CHOLECYSTECTOMY N/A 10/21/2014   Procedure: LAPAROSCOPIC CHOLECYSTECTOMY WITH INTRAOPERATIVE CHOLANGIOGRAM;  Surgeon: Chevis Pretty III, MD;  Location: [facility] OR;  Service: General;  Laterality: N/A;   COLONOSCOPY  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   IR ANGIO INTRA EXTRACRAN SEL INTERNAL CAROTID UNI R MOD SED  11/30/2021   IR ANGIOGRAM FOLLOW UP STUDY  11/30/2021   IR BONE MARROW BIOPSY & ASPIRATION  03/27/2022   IR NEURO EACH ADD'L AFTER BASIC UNI RIGHT (MS)  11/30/2021   IR TRANSCATH/EMBOLIZ  11/30/2021   KIDNEY TRANSPLANT  08/17/2011   [facility]    LAPAROSCOPIC CHOLECYSTECTOMY  10/21/2014   w/IOC   LEFT HEART CATHETERIZATION WITH CORONARY ANGIOGRAM N/A 03/21/2011   Procedure: LEFT HEART CATHETERIZATION WITH CORONARY ANGIOGRAM;  Surgeon: Chrystie Nose, MD;  Location: [facility] CATH LAB;  Service: Cardiovascular;  Laterality: N/A;   NEPHRECTOMY  08/2013   removed transplaned kidney   POLYPECTOMY     POSTERIOR FUSION CERVICAL SPINE  06/25/2012   for spinal stenosis   RADIOLOGY WITH ANESTHESIA N/A 11/30/2021   Procedure: RIGHT MIDDLE MENINGEAL ARTERY EMBOLIZATION;  Surgeon: Radiologist, Medication, MD;  Location: [facility] OR;  Service: Radiology;  Laterality: N/A;   VASECTOMY  2010   Family History  Adopted: Yes  Problem Relation Age of Onset   Colon cancer Neg Hx    Esophageal cancer Neg Hx    Rectal cancer Neg Hx    Stomach cancer Neg Hx    Colon polyps Neg Hx    Social History   Socioeconomic History   Marital status: Married    Spouse name: Chyrl Civatte   Number of children: 3   Years of education: Not on file   Highest education level: Not on file   Occupational History   Occupation: disabled    Employer: DISABLED  Tobacco Use   Smoking status: Former    Current packs/day: 0.00    Average packs/day: 0.5 packs/day for 32.0 years (16.0 ttl pk-yrs)    Types: Cigarettes    Start date: 27    Quit date: 02/24/2020    Years since quitting: 2.5   Smokeless tobacco: Never  Vaping Use   Vaping status: Never Used  Substance and Sexual Activity   Alcohol use: No    Alcohol/week: 0.0 standard drinks of alcohol   Drug use: No   Sexual activity: Yes  Other Topics Concern   Not on file  Social History Narrative   Drinks 1 cup of caffeine daily.   Social Determinants of Health   Financial Resource Strain: Medium Risk (11/02/2021)   Overall Financial Resource Strain (CARDIA)    Difficulty of Paying Living Expenses: Somewhat hard  Food Insecurity: No Food Insecurity (04/29/2022)   Hunger Vital Sign    Worried About Running Out of Food in the Last Year: Never true    Ran Out of Food in the Last Year: Never true  Transportation Needs: No Transportation Needs (04/29/2022)   PRAPARE - Administrator, Civil Service (Medical): No    Lack of Transportation (Non-Medical): No  Physical Activity: Insufficiently Active (06/25/2017)   Exercise Vital Sign    Days of Exercise per Week: 3 days    Minutes of Exercise per Session: 20 min  Stress: No Stress Concern Present (06/25/2017)   [employer] of Occupational Health - Occupational Stress Questionnaire    Feeling of Stress : Only a little  Social Connections: Moderately Integrated (06/25/2017)   Social Connection and Isolation Panel [NHANES]    Frequency of Communication with Friends and Family: More than three times a week    Frequency of Social Gatherings with Friends and Family: More than three  times a week    Attends Religious Services: More than 4 times per year    Active Member of Clubs or Organizations: No    Attends Banker Meetings: Never    Marital Status:  Married    Tobacco Counseling Counseling given: Not Answered   Clinical Intake:  Pre-visit preparation completed: No  Pain : 0-10 Pain Score: 9  Pain Type: Chronic pain Pain Location: Neck Pain Orientation: Other (Comment) (back of the neck) Pain Radiating Towards: shoulders Pain Descriptors / Indicators: Pressure Pain Onset: More than a month ago Pain Frequency: Intermittent Pain Relieving Factors: pain medication eases off Effect of Pain on Daily Activities: working  Pain Relieving Factors: pain medication eases off  BMI - recorded: 30.7 Nutritional Status: BMI > 30  Obese Nutritional Risks: Nausea/ vomitting/ diarrhea (Nausea takes Zofran) Diabetes: No  How often do you need to have someone help you when you read instructions, pamphlets, or other written materials from your doctor or pharmacy?: 1 - Never What is the last grade level you completed in school?: 12 Grade  Interpreter Needed?: No      Activities of Daily Living    09/18/2022    9:29 AM 04/29/2022    3:05 AM  In your present state of health, do you have any difficulty performing the following activities:  Hearing? 1   Vision? 1   Difficulty concentrating or making decisions? 1   Comment remebering and decision making   Walking or climbing stairs? 1   Comment sometimes   Dressing or bathing? 0   Doing errands, shopping? 0 0  Preparing Food and eating ? N   Using the Toilet? N   In the past six months, have you accidently leaked urine? Y   Do you have problems with loss of bowel control? N   Managing your Medications? N   Managing your Finances? N   Housekeeping or managing your Housekeeping? N     Patient Care Team: Ange Puskas, Donalee Citrin, NP as PCP - General (Family Medicine) Rennis Golden Lisette Abu, MD as PCP - Cardiology (Cardiology) Griselda Miner, MD as Consulting Physician (General Surgery) Coralyn Helling, MD as Consulting Physician (Pulmonary Disease) Lauris Poag, MD as Consulting Physician  (Nephrology) Sherren Kerns, MD (Inactive) as Consulting Physician (Vascular Surgery) Chrystie Nose, MD as Consulting Physician (Cardiology) Rachael Fee, MD as Attending Physician (Gastroenterology) Camille Bal, MD as Consulting Physician (Nephrology) Clarene [facility] Karma Lew, RN as [facility] Care Management  Indicate any recent Medical Services you may have received from other than [facility] providers in the past year (date may be approximate).     Assessment:   This is a routine wellness examination for [place].  Hearing/Vision screen No results found.  Dietary issues and exercise activities discussed:     Goals Addressed             This Visit's Progress    Patient Stated   On track    Would like to recovery from pain to go back to what he used to do        Depression Screen    09/18/2022    9:07 AM 05/31/2022   11:31 AM 05/22/2022    1:23 PM 02/28/2022    3:53 PM 09/12/2021   11:24 AM 09/06/2020   11:32 AM 10/22/2019    3:02 PM  PHQ 2/9 Scores  PHQ - 2 Score 0 5 0 0 0 0 0  PHQ- 9 Score 0 20  7        Fall Risk    09/18/2022    9:07 AM 02/28/2022    3:52 PM 12/07/2021   11:07 AM 10/19/2021    1:45 PM 09/12/2021   11:21 AM  Fall Risk   Falls in the past year? 1 1 0 1 0  Number falls in past yr: 1 1 0 0 0  Injury with Fall? 0 1 0 1 0  Risk for fall due to : History of fall(s) No Fall Risks No Fall Risks Impaired balance/gait;Impaired mobility No Fall Risks  Follow up Falls evaluation completed Falls evaluation completed Falls evaluation completed Falls evaluation completed;Education provided;Falls prevention discussed Falls evaluation completed    MEDICARE RISK AT HOME: Medicare Risk at Home Any stairs in or around the home?: Yes If so, are there any without handrails?: No Home free of loose throw rugs in walkways, pet beds, electrical cords, etc?: No Adequate lighting in your home to reduce risk of falls?: Yes Life alert?: No Use of a cane, walker  or w/c?: No Grab bars in the bathroom?: No Shower chair or bench in shower?: No Elevated toilet seat or a handicapped toilet?: No  TIMED UP AND GO:  Was the test performed?  Yes  Length of time to ambulate 10 feet: 20 sec Gait unsteady without use of assistive device, provider informed and interventions were implemented    Cognitive Function:    09/18/2022    9:08 AM 09/12/2021   11:27 AM  MMSE - Mini Mental State Exam  Orientation to time 5 4  Orientation to time comments  year 2023, season summer, date N/A, day Tuesday, month August.  Orientation to Place 5 3  Orientation to Place-comments  state [place], county [place], city [place], facility N/A, provider N/A.  Registration 3 3  Attention/ Calculation 5 2  Attention/Calculation-comments  DL  Recall 0 3  Language- name 2 objects 2 2  Language- repeat 1 0  Language- follow 3 step command 3 3  Language- read & follow direction 1 1  Write a sentence 1 0  Write a sentence-comments  I need to go finish today OK.  Copy design 1 1  Total score 27 22      08/23/2022   10:56 AM  Montreal Cognitive Assessment   Visuospatial/ Executive (0/5) 5  Naming (0/3) 2  Attention: Read list of digits (0/2) 1  Attention: Read list of letters (0/1) 0  Attention: Serial 7 subtraction starting at 100 (0/3) 1  Language: Repeat phrase (0/2) 0  Language : Fluency (0/1) 1  Abstraction (0/2) 0  Delayed Recall (0/5) 4  Orientation (0/6) 2  Total 16      09/06/2020   11:33 AM  6CIT Screen  What Year? 0 points  What month? 0 points  What time? 0 points  Count back from 20 0 points  Months in reverse 0 points  Repeat phrase 6 points  Total Score 6 points    Immunizations Immunization History  Administered Date(s) Administered   Influenza Whole 11/01/2010   Influenza, Seasonal, Injecte, Preservative Fre 10/29/2012   Influenza-Unspecified 11/21/2011, 11/05/2012, 09/22/2013, 09/22/2017, 11/23/2019, 10/28/2020, 12/04/2020   Moderna  Sars-Covid-2 Vaccination 04/06/2019, 05/07/2019, 03/31/2020   PPD Test 12/26/2012   Pneumococcal Polysaccharide-23 11/01/2010, 10/29/2012   Pneumococcal-Unspecified 11/05/2012   Tdap 01/22/2006, 06/08/2021    TDAP status: Up to date  Flu Vaccine status: Due, Education has been provided regarding the importance of this vaccine. Advised may receive this vaccine  at local pharmacy or Health Dept. Aware to provide a copy of the vaccination record if obtained from local pharmacy or Health Dept. Verbalized acceptance and understanding.  Pneumococcal vaccine status: Due, Education has been provided regarding the importance of this vaccine. Advised may receive this vaccine at local pharmacy or Health Dept. Aware to provide a copy of the vaccination record if obtained from local pharmacy or Health Dept. Verbalized acceptance and understanding.  Covid-19 vaccine status: Information provided on how to obtain vaccines.   Qualifies for Shingles Vaccine? Yes   Zostavax completed No   Shingrix Completed?: No.    Education has been provided regarding the importance of this vaccine. Patient has been advised to call insurance company to determine out of pocket expense if they have not yet received this vaccine. Advised may also receive vaccine at local pharmacy or Health Dept. Verbalized acceptance and understanding.  Screening Tests Health Maintenance  Topic Date Due   Zoster Vaccines- Shingrix (1 of 2) Never done   COVID-19 Vaccine (4 - 2023-24 season) 09/22/2021   INFLUENZA VACCINE  08/23/2022   HEMOGLOBIN A1C  11/21/2022   Medicare Annual Wellness (AWV)  09/18/2023   Colonoscopy  12/29/2026   DTaP/Tdap/Td (3 - Td or Tdap) 06/09/2031   Hepatitis C Screening  Completed   HIV Screening  Completed   HPV VACCINES  Aged Out   FOOT EXAM  Discontinued   OPHTHALMOLOGY EXAM  Discontinued    Health Maintenance  Health Maintenance Due  Topic Date Due   Zoster Vaccines- Shingrix (1 of 2) Never done    COVID-19 Vaccine (4 - 2023-24 season) 09/22/2021   INFLUENZA VACCINE  08/23/2022    Colorectal cancer screening: Type of screening: Colonoscopy. Completed 12/29/2019. Repeat every 5 years  Lung Cancer Screening: (Low Dose CT Chest recommended if Age 34-80 years, 20 pack-year currently smoking OR have quit w/in 15years.) does qualify.   Lung Cancer Screening Referral: yes follows up with Pulmonologist   Additional Screening:  Hepatitis C Screening: does qualify; Completed Yes   Vision Screening: Recommended annual ophthalmology exams for early detection of glaucoma and other disorders of the eye. Is the patient up to date with their annual eye exam?  Yes  Who is the provider or what is the name of the office in which the patient attends annual eye exams? Dr.McFlallen  If pt is not established with a provider, would they like to be referred to a provider to establish care? No .   Dental Screening: Recommended annual dental exams for proper oral hygiene  Diabetic Foot Exam: Diabetic Foot Exam: Completed N/A  Community Resource Referral / Chronic Care Management: CRR required this visit?  No   CCM required this visit?  No     Plan:     I have personally reviewed and noted the following in the patient's chart:   Medical and social history Use of alcohol, tobacco or illicit drugs  Current medications and supplements including opioid prescriptions. Patient is currently taking opioid prescriptions. Information provided to patient regarding non-opioid alternatives. Patient advised to discuss non-opioid treatment plan with their provider. Functional ability and status Nutritional status Physical activity Advanced directives List of other physicians Hospitalizations, surgeries, and ER visits in previous 12 months Vitals Screenings to include cognitive, depression, and falls Referrals and appointments  In addition, I have reviewed and discussed with patient certain preventive  protocols, quality metrics, and best practice recommendations. A written personalized care plan for preventive services as well as general preventive  health recommendations were provided to patient.     Caesar Bookman, NP   09/18/2022   After Visit Summary: (Pick Up) Due to this being a telephonic visit, with patients personalized plan was offered to patient and patient has requested to Pick up at office.In person   Nurse Notes: Advised to get shingles,COVID-19 and Influenza vaccine.

## 2022-09-18 NOTE — Patient Instructions (Signed)
Kirk [name] , Thank you for taking time to come for your Medicare Wellness Visit. I appreciate your ongoing commitment to your health goals. Please review the following plan we discussed and let me know if I can assist you in the future.   Screening recommendations/referrals: Colonoscopy : Up to date  Recommended yearly ophthalmology/optometry visit for glaucoma screening and checkup Recommended yearly dental visit for hygiene and checkup  Vaccinations: Influenza vaccine due annually in September/October Pneumococcal vaccine  Tdap vaccine : Up to date  Shingles vaccine Due please get vaccine at the pharmacy     Advanced directives: No   Conditions/risks identified:  advanced age (>54men, >77 women);male gender;hypertension;dyslipidemia;obesity (BMI >30kg/m2);smoking/ tobacco exposure  Next appointment: 1 year   Preventive Care 56 Years and Older, Male Preventive care refers to lifestyle choices and visits with your health care provider that can promote health and wellness. What does preventive care include? A yearly physical exam. This is also called an annual well check. Dental exams once or twice a year. Routine eye exams. Ask your health care provider how often you should have your eyes checked. Personal lifestyle choices, including: Daily care of your teeth and gums. Regular physical activity. Eating a healthy diet. Avoiding tobacco and drug use. Limiting alcohol use. Practicing safe sex. Taking low doses of aspirin every day. Taking vitamin and mineral supplements as recommended by your health care provider. What happens during an annual well check? The services and screenings done by your health care provider during your annual well check will depend on your age, overall health, lifestyle risk factors, and family history of disease. Counseling  Your health care provider may ask you questions about your: Alcohol use. Tobacco use. Drug use. Emotional well-being. Home  and relationship well-being. Sexual activity. Eating habits. History of falls. Memory and ability to understand (cognition). Work and work Astronomer. Screening  You may have the following tests or measurements: Height, weight, and BMI. Blood pressure. Lipid and cholesterol levels. These may be checked every 5 years, or more frequently if you are over 37 years old. Skin check. Lung cancer screening. You may have this screening every year starting at age 106 if you have a 30-pack-year history of smoking and currently smoke or have quit within the past 15 years. Fecal occult blood test (FOBT) of the stool. You may have this test every year starting at age 10. Flexible sigmoidoscopy or colonoscopy. You may have a sigmoidoscopy every 5 years or a colonoscopy every 10 years starting at age 42. Prostate cancer screening. Recommendations will vary depending on your family history and other risks. Hepatitis C blood test. Hepatitis B blood test. Sexually transmitted disease (STD) testing. Diabetes screening. This is done by checking your blood sugar (glucose) after you have not eaten for a while (fasting). You may have this done every 1-3 years. Abdominal aortic aneurysm (AAA) screening. You may need this if you are a current or former smoker. Osteoporosis. You may be screened starting at age 70 if you are at high risk. Talk with your health care provider about your test results, treatment options, and if necessary, the need for more tests. Vaccines  Your health care provider may recommend certain vaccines, such as: Influenza vaccine. This is recommended every year. Tetanus, diphtheria, and acellular pertussis (Tdap, Td) vaccine. You may need a Td booster every 10 years. Zoster vaccine. You may need this after age 42. Pneumococcal 13-valent conjugate (PCV13) vaccine. One dose is recommended after age 14. Pneumococcal polysaccharide (PPSV23) vaccine.  One dose is recommended after age 50. Talk to  your health care provider about which screenings and vaccines you need and how often you need them. This information is not intended to replace advice given to you by your health care provider. Make sure you discuss any questions you have with your health care provider. Document Released: 02/04/2015 Document Revised: 09/28/2015 Document Reviewed: 11/09/2014 Elsevier Interactive Patient Education  2017 [employer].  Fall Prevention in the Home Falls can cause injuries. They can happen to people of all ages. There are many things you can do to make your home safe and to help prevent falls. What can I do on the outside of my home? Regularly fix the edges of walkways and driveways and fix any cracks. Remove anything that might make you trip as you walk through a door, such as a raised step or threshold. Trim any bushes or trees on the path to your home. Use bright outdoor lighting. Clear any walking paths of anything that might make someone trip, such as rocks or tools. Regularly check to see if handrails are loose or broken. Make sure that both sides of any steps have handrails. Any raised decks and porches should have guardrails on the edges. Have any leaves, snow, or ice cleared regularly. Use sand or salt on walking paths during winter. Clean up any spills in your garage right away. This includes oil or grease spills. What can I do in the bathroom? Use night lights. Install grab bars by the toilet and in the tub and shower. Do not use towel bars as grab bars. Use non-skid mats or decals in the tub or shower. If you need to sit down in the shower, use a plastic, non-slip stool. Keep the floor dry. Clean up any water that spills on the floor as soon as it happens. Remove soap buildup in the tub or shower regularly. Attach bath mats securely with double-sided non-slip rug tape. Do not have throw rugs and other things on the floor that can make you trip. What can I do in the bedroom? Use  night lights. Make sure that you have a light by your bed that is easy to reach. Do not use any sheets or blankets that are too big for your bed. They should not hang down onto the floor. Have a firm chair that has side arms. You can use this for support while you get dressed. Do not have throw rugs and other things on the floor that can make you trip. What can I do in the kitchen? Clean up any spills right away. Avoid walking on wet floors. Keep items that you use a lot in easy-to-reach places. If you need to reach something above you, use a strong step stool that has a grab bar. Keep electrical cords out of the way. Do not use floor polish or wax that makes floors slippery. If you must use wax, use non-skid floor wax. Do not have throw rugs and other things on the floor that can make you trip. What can I do with my stairs? Do not leave any items on the stairs. Make sure that there are handrails on both sides of the stairs and use them. Fix handrails that are broken or loose. Make sure that handrails are as long as the stairways. Check any carpeting to make sure that it is firmly attached to the stairs. Fix any carpet that is loose or worn. Avoid having throw rugs at the top or bottom of  the stairs. If you do have throw rugs, attach them to the floor with carpet tape. Make sure that you have a light switch at the top of the stairs and the bottom of the stairs. If you do not have them, ask someone to add them for you. What else can I do to help prevent falls? Wear shoes that: Do not have high heels. Have rubber bottoms. Are comfortable and fit you well. Are closed at the toe. Do not wear sandals. If you use a stepladder: Make sure that it is fully opened. Do not climb a closed stepladder. Make sure that both sides of the stepladder are locked into place. Ask someone to hold it for you, if possible. Clearly mark and make sure that you can see: Any grab bars or handrails. First and last  steps. Where the edge of each step is. Use tools that help you move around (mobility aids) if they are needed. These include: Canes. Walkers. Scooters. Crutches. Turn on the lights when you go into a dark area. Replace any light bulbs as soon as they burn out. Set up your furniture so you have a clear path. Avoid moving your furniture around. If any of your floors are uneven, fix them. If there are any pets around you, be aware of where they are. Review your medicines with your doctor. Some medicines can make you feel dizzy. This can increase your chance of falling. Ask your doctor what other things that you can do to help prevent falls. This information is not intended to replace advice given to you by your health care provider. Make sure you discuss any questions you have with your health care provider. Document Released: 11/04/2008 Document Revised: 06/16/2015 Document Reviewed: 02/12/2014 Elsevier Interactive Patient Education  2017 [employer].

## 2022-09-18 NOTE — Assessment & Plan Note (Signed)
-   RX Astelin nasal spray 1 spray per nostril twice daily

## 2022-09-18 NOTE — Assessment & Plan Note (Addendum)
-   Quit smoking in February 2022. Restriction/ min curvature on spirometry in 2019. Patient has chronic dyspnea symptoms and productive cough, worse over the last several months since having RSV and rhinovirus in April 2024. Maintained on Symbicort 2 puffs twice daily, ran out of medication last month. Asthma symptoms do not appear acutely exacerbated. He had no wheezing on exam. No indication for prednisone today. Refill Symbicort sent to pharmacy on file.

## 2022-09-18 NOTE — Progress Notes (Signed)
@Patient  ID: Kirk [name], male    DOB: 1960-10-23, 62 y.o.   MRN: [id number]  Chief Complaint  Patient presents with   Follow-up    SOB with exertion.    Referring provider: Caesar Bookman, NP  HPI: 62 year old male, Former smoker quit in April 2022 (16-pack-year history).  Past medical history significant for mild persistent asthma, systolic heart failure, hypertension, OSA on CPAP, type 2 diabetes, end-stage renal disease on dialysis.  Patient of Dr. Sherene Sires.  Maintained on Symbicort 160, as needed albuterol HFA or DuoNeb.  Spirometry in February 2023 showed moderate airway obstruction/FEV 1 42%.   09/18/2022 Patient presents today for overdue follow-up asthma. Patient follows with Dr. Sherene Sires. Reports increased shortness of breath symptoms with exertion and cough since having rhinovirus and RSV in April 2024. These symptoms are not new. He gets winded when walking. He ran out of symbicort last month. Symptoms were previously well controlled. He has associated sinus congestion/PND. Cough is productive with clear sputum is clear. He has not tried any nasal sprays. CT chest in April 2024 showed mucus plugging, mild consolidations and numerous small pulmonary nodules most prominent in lower lungs, slightly worse when compared to prior exam.  Findings felt to be likely due to infection or aspiration. CXR in July without acute cardiopulmonary process.   Allergies  Allergen Reactions   Codeine Nausea And Vomiting   Hydrocodone-Acetaminophen Nausea And Vomiting    Immunization History  Administered Date(s) Administered   Influenza Whole 11/01/2010   Influenza, Seasonal, Injecte, Preservative Fre 10/29/2012   Influenza-Unspecified 11/21/2011, 11/05/2012, 09/22/2013, 09/22/2017, 11/23/2019, 10/28/2020, 12/04/2020   Moderna Sars-Covid-2 Vaccination 04/06/2019, 05/07/2019, 03/31/2020   PPD Test 12/26/2012   Pneumococcal Polysaccharide-23 11/01/2010, 10/29/2012   Pneumococcal-Unspecified  11/05/2012   Tdap 01/22/2006, 06/08/2021    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)    Anxiety    Asthma    Asthma    moderate persistent   Carpal tunnel syndrome    Cellulitis and abscess of trunk    Cholelithiasis 07/13/2014   Chronic headaches    Cigarette smoker 07/11/2017   Stopped 2/022  - referred to start Lung cancer screening 04/2021         Debility, unspecified    Dermatophytosis of the body    Dysrhythmia    history of   Edema    End stage renal disease on dialysis (HCC)    "MWF; [facility] in [place]" (10/21/2014)   Essential hypertension, benign    Generalized anxiety disorder 05/25/2022   GERD (gastroesophageal reflux disease)    Gout, unspecified    HTN (hypertension)    Hypertrophy of prostate without urinary obstruction and other lower urinary tract symptoms (LUTS)    Hypotension, unspecified    Impotence of organic origin    Insomnia, unspecified    Kidney replaced by transplant    Localization-related (focal) (partial) epilepsy and epileptic syndromes with complex partial seizures, without mention of intractable epilepsy    12-15-19- Wife states he has NEVER had a seizure    Lumbago    Memory loss    OSA on CPAP    Other and unspecified hyperlipidemia    controlled /managed per wife    Other chronic nonalcoholic liver disease    Other malaise and fatigue    Other nonspecific abnormal  serum enzyme levels    Pain in joint, lower leg    Pain in joint, upper arm    Pneumonia "several times"   PONV (postoperative nausea and vomiting)    Renal dialysis status(V45.11) 02/05/2010   restarted 01/02/13 ofter renal trransplant failure   Secondary hyperparathyroidism (of renal origin)    Shortness of breath    Sleep apnea    wears cpap    Tension headache    Unspecified constipation     Unspecified essential hypertension    Unspecified hereditary and idiopathic peripheral neuropathy    Unspecified vitamin D deficiency     Tobacco History: Social History   Tobacco Use  Smoking Status Former   Current packs/day: 0.00   Average packs/day: 0.5 packs/day for 32.0 years (16.0 ttl pk-yrs)   Types: Cigarettes   Start date: 57   Quit date: 02/24/2020   Years since quitting: 2.5  Smokeless Tobacco Never   Counseling given: Not Answered   Outpatient Medications Prior to Visit  Medication Sig Dispense Refill   acetaminophen (TYLENOL) 500 MG tablet Take 1 tablet (500 mg total) by mouth every 6 (six) hours as needed for moderate pain.     amLODipine (NORVASC) 10 MG tablet Take 1 tablet (10 mg total) by mouth every evening.     aspirin 81 MG chewable tablet Chew 81 mg by mouth daily.     calcitRIOL (ROCALTROL) 0.5 MCG capsule Take 2 capsules (1 mcg total) by mouth every Monday, Wednesday, and Friday with hemodialysis. 60 capsule 1   Cholecalciferol (VITAMIN D3) 25 MCG (1000 UT) CAPS Take 4,000 Units by mouth daily.     cinacalcet (SENSIPAR) 30 MG tablet Take 2 tablets (60 mg total) by mouth 3 (three) times a week. 60 tablet    diphenhydrAMINE (BENADRYL) 25 mg capsule Take 25 mg by mouth every 6 (six) hours as needed for allergies.     doxazosin (CARDURA) 8 MG tablet Take 1 tablet (8 mg total) by mouth daily. 90 tablet 3   ethyl chloride spray SMARTSIG:1 Spray(s) Topical 3 Times a Week     FOSRENOL 1000 MG PACK Take 1 packet by mouth 3 (three) times daily.     hydrALAZINE (APRESOLINE) 100 MG tablet Take 1 tablet (100 mg total) by mouth 3 (three) times daily. 270 tablet 3   isosorbide mononitrate (IMDUR) 120 MG 24 hr tablet Take 1 tablet (120 mg total) by mouth daily. 90 tablet 3   methocarbamol (ROBAXIN) 750 MG tablet Take 1 tablet (750 mg total) by mouth 4 (four) times daily.     metoprolol tartrate (LOPRESSOR) 100 MG tablet TAKE 1 TABLET BY MOUTH TWICE A DAY 180 tablet 1    multivitamin (RENA-VIT) TABS tablet Take 1 tablet by mouth daily.  0   ondansetron (ZOFRAN-ODT) 4 MG disintegrating tablet TAKE 1 TABLET BY MOUTH EVERY 8 HOURS AS NEEDED FOR NAUSEA AND VOMITING 14 tablet 3   pantoprazole (PROTONIX) 40 MG tablet Take 1 tablet (40 mg total) by mouth 2 (two) times daily before a meal.     pravastatin (PRAVACHOL) 40 MG tablet TAKE 1 TABLET BY MOUTH EVERY DAY IN THE EVENING 90 tablet 2   sevelamer carbonate (RENVELA) 800 MG tablet Take 2 tablets (1,600 mg total) by mouth as needed (snacks).     sevelamer carbonate (RENVELA) 800 MG tablet Take 4 tablets (3,200 mg total) by mouth 3 (three) times daily with meals.     sucralfate (CARAFATE) 1 GM/10ML suspension Take 10 mLs (1  g total) by mouth 4 (four) times daily -  with meals and at bedtime. 420 mL 0   traZODone (DESYREL) 50 MG tablet Take 0.5-1 tablets (25-50 mg total) by mouth at bedtime as needed. for sleep 90 tablet 1   vortioxetine HBr (TRINTELLIX) 5 MG TABS tablet Take 1 tablet (5 mg total) by mouth daily. 30 tablet 1   dextromethorphan-guaiFENesin (MUCINEX DM) 30-600 MG 12hr tablet Take 1 tablet by mouth 2 (two) times daily. 14 tablet 0   budesonide-formoterol (SYMBICORT) 160-4.5 MCG/ACT inhaler Inhale 2 puffs into the lungs as needed. (Patient not taking: Reported on 09/18/2022)     No facility-administered medications prior to visit.    Review of Systems  Review of Systems  Constitutional: Negative.   HENT:  Positive for postnasal drip.   Respiratory:  Positive for cough and shortness of breath.   Cardiovascular: Negative.     Physical Exam  BP (!) 122/58 (BP Location: Right Arm, Patient Position: Sitting, Cuff Size: Normal)   Pulse 67   Temp 98.7 F (37.1 C) (Oral)   Ht 5\' 7"  (1.702 m)   Wt 196 lb (88.9 kg)   SpO2 96%   BMI 30.70 kg/m  Physical Exam Constitutional:      Appearance: Normal appearance.  HENT:     Head: Normocephalic and atraumatic.     Mouth/Throat:     Mouth: Mucous  membranes are moist.     Pharynx: Oropharynx is clear.  Cardiovascular:     Rate and Rhythm: Normal rate and regular rhythm.  Pulmonary:     Effort: Pulmonary effort is normal. No respiratory distress.     Breath sounds: No stridor. Rhonchi present. No wheezing or rales.     Comments: Dull rhonchi right base; No overt wheezing or respiratory distress Musculoskeletal:        General: Normal range of motion.  Skin:    General: Skin is warm and dry.  Neurological:     General: No focal deficit present.     Mental Status: He is alert and oriented to person, place, and time. Mental status is at baseline.  Psychiatric:        Mood and Affect: Mood normal.        Behavior: Behavior normal.        Thought Content: Thought content normal.        Judgment: Judgment normal.      Lab Results:  CBC    Component Value Date/Time   WBC 3.8 (L) 07/30/2022 1249   RBC 3.35 (L) 07/30/2022 1249   HGB 10.8 (L) 07/30/2022 1249   HGB 12.2 (L) 07/24/2022 1432   HCT 32.8 (L) 07/30/2022 1249   PLT 83 (L) 07/30/2022 1249   PLT 114 (L) 07/24/2022 1432   MCV 97.9 07/30/2022 1249   MCH 32.2 07/30/2022 1249   MCHC 32.9 07/30/2022 1249   RDW 13.2 07/30/2022 1249   RDW 16.7 (H) 11/26/2013 0914   LYMPHSABS 0.7 07/30/2022 1249   LYMPHSABS 1.3 11/26/2013 0914   MONOABS 0.4 07/30/2022 1249   EOSABS 0.1 07/30/2022 1249   EOSABS 0.2 11/26/2013 0914   BASOSABS 0.0 07/30/2022 1249   BASOSABS 0.1 11/26/2013 0914    BMET    Component Value Date/Time   NA 135 07/30/2022 1249   NA 136 11/26/2013 0914   K 4.5 07/30/2022 1249   CL 95 (L) 07/30/2022 1249   CO2 26 07/30/2022 1249   GLUCOSE 104 (H) 07/30/2022 1249   BUN 54 (H)  07/30/2022 1249   BUN 66 (A) 05/25/2015 0000   BUN 66 (A) 05/25/2015 0000   CREATININE 10.20 (H) 07/30/2022 1249   CREATININE 9.87 (HH) 07/24/2022 1432   CREATININE 8.37 (H) 02/03/2018 1523   CALCIUM 8.6 (L) 07/30/2022 1249   CALCIUM 8.8 06/08/2015 0000   CALCIUM 7.9 (L)  01/19/2010 1016   GFRNONAA 5 (L) 07/30/2022 1249   GFRNONAA 6 (L) 07/24/2022 1432   GFRAA 4 (L) 07/15/2019 0704    BNP    Component Value Date/Time   BNP 1,139.8 (H) 12/02/2018 2031    ProBNP    Component Value Date/Time   PROBNP 725.4 (H) 08/04/2011 0520    Imaging: No results found.   Assessment & Plan:   Mild persistent chronic asthma without complication - Quit smoking in February 2022. Restriction/ min curvature on spirometry in 2019. Patient has chronic dyspnea symptoms and productive cough, worse over the last several months since having RSV and rhinovirus in April 2024. Maintained on Symbicort 2 puffs twice daily, ran out of medication last month. Asthma symptoms do not appear acutely exacerbated. He had no wheezing on exam. No indication for prednisone today. Refill Symbicort sent to pharmacy on file.   Chronic bronchitis with acute exacerbation (HCC) - Treating for acute exacerbation chronic bronchitis. Rx Doxycycline 100mg  BID a 10 days. Advised patient take robitussin-dm 5ml q 4-6 hours for cough and use flutter valve three times a day. FU in 6 weeks to ensure improvement.   Rhinitis - RX Astelin nasal spray 1 spray per nostril twice daily    Glenford Bayley, NP 09/18/2022

## 2022-09-18 NOTE — Patient Instructions (Addendum)
Recommendations: Start Doxycycline 1 tablet twice daily x 10 days Take robitussin-dm as directed for cough Use flutter valve three times a day Resume Symbicort two puffs morning and evening   Follow-up 4-8 weeks with Dr. Sherene Sires or [facility]

## 2022-09-19 DIAGNOSIS — Z992 Dependence on renal dialysis: Secondary | ICD-10-CM | POA: Diagnosis not present

## 2022-09-19 DIAGNOSIS — N2581 Secondary hyperparathyroidism of renal origin: Secondary | ICD-10-CM | POA: Diagnosis not present

## 2022-09-19 DIAGNOSIS — D631 Anemia in chronic kidney disease: Secondary | ICD-10-CM | POA: Diagnosis not present

## 2022-09-19 DIAGNOSIS — N186 End stage renal disease: Secondary | ICD-10-CM | POA: Diagnosis not present

## 2022-09-21 DIAGNOSIS — N2581 Secondary hyperparathyroidism of renal origin: Secondary | ICD-10-CM | POA: Diagnosis not present

## 2022-09-21 DIAGNOSIS — Z992 Dependence on renal dialysis: Secondary | ICD-10-CM | POA: Diagnosis not present

## 2022-09-21 DIAGNOSIS — N186 End stage renal disease: Secondary | ICD-10-CM | POA: Diagnosis not present

## 2022-09-21 DIAGNOSIS — D631 Anemia in chronic kidney disease: Secondary | ICD-10-CM | POA: Diagnosis not present

## 2022-09-23 DIAGNOSIS — T8612 Kidney transplant failure: Secondary | ICD-10-CM | POA: Diagnosis not present

## 2022-09-23 DIAGNOSIS — Z992 Dependence on renal dialysis: Secondary | ICD-10-CM | POA: Diagnosis not present

## 2022-09-23 DIAGNOSIS — N186 End stage renal disease: Secondary | ICD-10-CM | POA: Diagnosis not present

## 2022-09-24 DIAGNOSIS — N2581 Secondary hyperparathyroidism of renal origin: Secondary | ICD-10-CM | POA: Diagnosis not present

## 2022-09-24 DIAGNOSIS — Z992 Dependence on renal dialysis: Secondary | ICD-10-CM | POA: Diagnosis not present

## 2022-09-24 DIAGNOSIS — N186 End stage renal disease: Secondary | ICD-10-CM | POA: Diagnosis not present

## 2022-09-24 DIAGNOSIS — D631 Anemia in chronic kidney disease: Secondary | ICD-10-CM | POA: Diagnosis not present

## 2022-09-26 DIAGNOSIS — Z992 Dependence on renal dialysis: Secondary | ICD-10-CM | POA: Diagnosis not present

## 2022-09-26 DIAGNOSIS — D631 Anemia in chronic kidney disease: Secondary | ICD-10-CM | POA: Diagnosis not present

## 2022-09-26 DIAGNOSIS — N2581 Secondary hyperparathyroidism of renal origin: Secondary | ICD-10-CM | POA: Diagnosis not present

## 2022-09-26 DIAGNOSIS — N186 End stage renal disease: Secondary | ICD-10-CM | POA: Diagnosis not present

## 2022-09-28 DIAGNOSIS — Z992 Dependence on renal dialysis: Secondary | ICD-10-CM | POA: Diagnosis not present

## 2022-09-28 DIAGNOSIS — N186 End stage renal disease: Secondary | ICD-10-CM | POA: Diagnosis not present

## 2022-09-28 DIAGNOSIS — N2581 Secondary hyperparathyroidism of renal origin: Secondary | ICD-10-CM | POA: Diagnosis not present

## 2022-09-28 DIAGNOSIS — D631 Anemia in chronic kidney disease: Secondary | ICD-10-CM | POA: Diagnosis not present

## 2022-10-01 DIAGNOSIS — D631 Anemia in chronic kidney disease: Secondary | ICD-10-CM | POA: Diagnosis not present

## 2022-10-01 DIAGNOSIS — Z992 Dependence on renal dialysis: Secondary | ICD-10-CM | POA: Diagnosis not present

## 2022-10-01 DIAGNOSIS — N2581 Secondary hyperparathyroidism of renal origin: Secondary | ICD-10-CM | POA: Diagnosis not present

## 2022-10-01 DIAGNOSIS — N186 End stage renal disease: Secondary | ICD-10-CM | POA: Diagnosis not present

## 2022-10-02 ENCOUNTER — Encounter: Payer: Self-pay | Admitting: Family

## 2022-10-02 ENCOUNTER — Ambulatory Visit (INDEPENDENT_AMBULATORY_CARE_PROVIDER_SITE_OTHER): Payer: Medicare Other | Admitting: Family

## 2022-10-02 VITALS — BP 130/80 | HR 65 | Temp 97.8°F | Resp 14 | Ht 67.0 in | Wt 197.0 lb

## 2022-10-02 DIAGNOSIS — R112 Nausea with vomiting, unspecified: Secondary | ICD-10-CM

## 2022-10-02 DIAGNOSIS — I120 Hypertensive chronic kidney disease with stage 5 chronic kidney disease or end stage renal disease: Secondary | ICD-10-CM

## 2022-10-02 DIAGNOSIS — R569 Unspecified convulsions: Secondary | ICD-10-CM | POA: Diagnosis not present

## 2022-10-02 DIAGNOSIS — N186 End stage renal disease: Secondary | ICD-10-CM

## 2022-10-02 DIAGNOSIS — E1121 Type 2 diabetes mellitus with diabetic nephropathy: Secondary | ICD-10-CM | POA: Diagnosis not present

## 2022-10-02 DIAGNOSIS — K219 Gastro-esophageal reflux disease without esophagitis: Secondary | ICD-10-CM | POA: Diagnosis not present

## 2022-10-02 DIAGNOSIS — M5412 Radiculopathy, cervical region: Secondary | ICD-10-CM | POA: Diagnosis not present

## 2022-10-02 DIAGNOSIS — F411 Generalized anxiety disorder: Secondary | ICD-10-CM

## 2022-10-02 DIAGNOSIS — F339 Major depressive disorder, recurrent, unspecified: Secondary | ICD-10-CM

## 2022-10-02 DIAGNOSIS — E782 Mixed hyperlipidemia: Secondary | ICD-10-CM

## 2022-10-02 MED ORDER — METHOCARBAMOL 750 MG PO TABS
750.0000 mg | ORAL_TABLET | Freq: Four times a day (QID) | ORAL | 3 refills | Status: DC
Start: 2022-10-02 — End: 2023-01-01

## 2022-10-02 MED ORDER — ONDANSETRON 4 MG PO TBDP
ORAL_TABLET | ORAL | 3 refills | Status: DC
Start: 2022-10-02 — End: 2023-06-19

## 2022-10-02 MED ORDER — PANTOPRAZOLE SODIUM 40 MG PO TBEC
40.0000 mg | DELAYED_RELEASE_TABLET | Freq: Two times a day (BID) | ORAL | 3 refills | Status: DC
Start: 2022-10-02 — End: 2023-03-16

## 2022-10-02 NOTE — Progress Notes (Unsigned)
Provider: Richarda Blade FNP-C   Steaven Wholey, Donalee Citrin, NP  Patient Care Team: Eliam Snapp, Donalee Citrin, NP as PCP - General (Family Medicine) Rennis Golden Lisette Abu, MD as PCP - Cardiology (Cardiology) Griselda Miner, MD as Consulting Physician (General Surgery) Coralyn Helling, MD as Consulting Physician (Pulmonary Disease) Lauris Poag, MD as Consulting Physician (Nephrology) Sherren Kerns, MD (Inactive) as Consulting Physician (Vascular Surgery) Chrystie Nose, MD as Consulting Physician (Cardiology) Rachael Fee, MD as Attending Physician (Gastroenterology) Camille Bal, MD as Consulting Physician (Nephrology) Clarene Duke Karma Lew, RN as [facility] Care Management  Extended Emergency Contact Information Primary Emergency Contact: Jasmine Awe Address: [address]          [place], [place] [address] Darden [place] of [place] Home Phone: [phone] Mobile Phone: [phone] Relation: Spouse  Code Status:  Full Code Goals of care: Advanced Directive information    10/02/2022   11:25 AM  Advanced Directives  Does Patient Have a Medical Advance Directive? No     Chief Complaint  Patient presents with  . Follow-up    6 month follow up     HPI:  Pt is a 62 y.o. male seen today for 6 months follow up medical management of chronic diseases.    Type 2 DM - wife recall CBG in the 100's -110's.No hypoglycemia.No recent ophthalmology visit. He declines follow up with Podiatrist states expected great toenails to be grinded down but toenails were just trim which he could have done it at home for himself.    Hypertension - has chronic chest pain.some times has short of breath and palpitation.No symptoms today.Follows up with Cardiologist. Continue with Hemodialysis three times per week.   GERD - takes Mylanta which helps.He stopped Protonix would like refills.   OSA - request referral to Neurologist that ordered sleep study though he is already established with  pulmonologist and has upcoming appointment oct 1st,2024.discussed with wife to reach out to Pulmonology for sleep study.  Sezuire - no recent seizure activity.  Past Medical History:  Diagnosis Date  . Acute edema of lung, unspecified   . Acute, but ill-defined, cerebrovascular disease   . Allergy   . Anemia   . Anemia in chronic kidney disease(285.21)   . Anxiety   . Asthma   . Asthma    moderate persistent  . Carpal tunnel syndrome   . Cellulitis and abscess of trunk   . Cholelithiasis 07/13/2014  . Chronic headaches   . Cigarette smoker 07/11/2017   Stopped 2/022  - referred to start Lung cancer screening 04/2021        . Debility, unspecified   . Dermatophytosis of the body   . Dysrhythmia    history of  . Edema   . End stage renal disease on dialysis [facility])    "MWF; [facility]" (10/21/2014)  . Essential hypertension, benign   . Generalized anxiety disorder 05/25/2022  . GERD (gastroesophageal reflux disease)   . Gout, unspecified   . HTN (hypertension)   . Hypertrophy of prostate without urinary obstruction and other lower urinary tract symptoms (LUTS)   . Hypotension, unspecified   . Impotence of organic origin   . Insomnia, unspecified   . Kidney replaced by transplant   . Localization-related (focal) (partial) epilepsy and epileptic syndromes with complex partial seizures, without mention of intractable epilepsy    12-15-19- Wife states he has NEVER had a seizure   . Lumbago   . Memory loss   .  OSA on CPAP   . Other and unspecified hyperlipidemia    controlled /managed per wife   . Other chronic nonalcoholic liver disease   . Other malaise and fatigue   . Other nonspecific abnormal serum enzyme levels   . Pain in joint, lower leg   . Pain in joint, upper arm   . Pneumonia "several times"  . PONV (postoperative nausea and vomiting)   . Renal dialysis status(V45.11) 02/05/2010   restarted 01/02/13 ofter renal trransplant failure  .  Secondary hyperparathyroidism (of renal origin)   . Shortness of breath   . Sleep apnea    wears cpap   . Tension headache   . Unspecified constipation   . Unspecified essential hypertension   . Unspecified hereditary and idiopathic peripheral neuropathy   . Unspecified vitamin D deficiency    Past Surgical History:  Procedure Laterality Date  . AV FISTULA PLACEMENT Left ?2010   "forearm; at [facility]"  . BACK SURGERY    . CARDIAC CATHETERIZATION  03/21/2011  . CHOLECYSTECTOMY N/A 10/21/2014   Procedure: LAPAROSCOPIC CHOLECYSTECTOMY WITH INTRAOPERATIVE CHOLANGIOGRAM;  Surgeon: Chevis Pretty III, MD;  Location: [facility] OR;  Service: General;  Laterality: N/A;  . COLONOSCOPY    . 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 IR ANGIO INTRA EXTRACRAN SEL INTERNAL CAROTID UNI R MOD SED  11/30/2021  . IR ANGIOGRAM FOLLOW UP STUDY  11/30/2021  . IR BONE MARROW BIOPSY & ASPIRATION  03/27/2022  . IR NEURO EACH ADD'L AFTER BASIC UNI RIGHT (MS)  11/30/2021  . IR TRANSCATH/EMBOLIZ  11/30/2021  . KIDNEY TRANSPLANT  08/17/2011   [facility]   . LAPAROSCOPIC CHOLECYSTECTOMY  10/21/2014   w/IOC  . LEFT HEART CATHETERIZATION WITH CORONARY ANGIOGRAM N/A 03/21/2011   Procedure: LEFT HEART CATHETERIZATION WITH CORONARY ANGIOGRAM;  Surgeon: Chrystie Nose, MD;  Location: [facility] CATH LAB;  Service: Cardiovascular;  Laterality: N/A;  . NEPHRECTOMY  08/2013   removed transplaned kidney  . POLYPECTOMY    . POSTERIOR FUSION CERVICAL SPINE  06/25/2012   for spinal stenosis  . RADIOLOGY WITH ANESTHESIA N/A 11/30/2021   Procedure: RIGHT MIDDLE MENINGEAL ARTERY EMBOLIZATION;  Surgeon: Radiologist, Medication, MD;  Location: [facility] OR;  Service: Radiology;  Laterality: N/A;  . VASECTOMY  2010    Allergies  Allergen Reactions  . Codeine Nausea And Vomiting  . Hydrocodone-Acetaminophen Nausea And Vomiting    Allergies as of 10/02/2022       Reactions   Codeine Nausea And Vomiting    Hydrocodone-acetaminophen Nausea And Vomiting        Medication List        Accurate as of October 02, 2022 11:47 AM. If you have any questions, ask your nurse or doctor.          acetaminophen 500 MG tablet Commonly known as: TYLENOL Take 1 tablet (500 mg total) by mouth every 6 (six) hours as needed for moderate pain.   amLODipine 10 MG tablet Commonly known as: NORVASC Take 1 tablet (10 mg total) by mouth every evening.   aspirin 81 MG chewable tablet Chew 81 mg by mouth daily.   azelastine 0.1 % nasal spray Commonly known as: ASTELIN Place 1 spray into both nostrils 2 (two) times daily. Use in each nostril as directed   budesonide-formoterol 160-4.5 MCG/ACT inhaler Commonly known as: SYMBICORT Inhale 2 puffs into the lungs as needed.   calcitRIOL 0.5 MCG capsule Commonly known as: ROCALTROL Take 2 capsules (1  mcg total) by mouth every Monday, Wednesday, and Friday with hemodialysis.   cinacalcet 30 MG tablet Commonly known as: SENSIPAR Take 2 tablets (60 mg total) by mouth 3 (three) times a week.   diphenhydrAMINE 25 mg capsule Commonly known as: BENADRYL Take 25 mg by mouth every 6 (six) hours as needed for allergies.   doxazosin 8 MG tablet Commonly known as: CARDURA Take 1 tablet (8 mg total) by mouth daily.   doxycycline 100 MG tablet Commonly known as: VIBRA-TABS Take 1 tablet (100 mg total) by mouth 2 (two) times daily.   ethyl chloride spray SMARTSIG:1 Spray(s) Topical 3 Times a Week   Fosrenol 1000 MG Pack Generic drug: Lanthanum Carbonate Take 1 packet by mouth 3 (three) times daily.   guaiFENesin-dextromethorphan 100-10 MG/5ML syrup Commonly known as: ROBITUSSIN DM Take 5 mLs by mouth every 4 (four) hours as needed for cough.   hydrALAZINE 100 MG tablet Commonly known as: APRESOLINE Take 1 tablet (100 mg total) by mouth 3 (three) times daily.   isosorbide mononitrate 120 MG 24 hr tablet Commonly known as: IMDUR Take 1 tablet (120  mg total) by mouth daily.   methocarbamol 750 MG tablet Commonly known as: ROBAXIN Take 1 tablet (750 mg total) by mouth 4 (four) times daily.   metoprolol tartrate 100 MG tablet Commonly known as: LOPRESSOR TAKE 1 TABLET BY MOUTH TWICE A DAY   multivitamin Tabs tablet Take 1 tablet by mouth daily.   ondansetron 4 MG disintegrating tablet Commonly known as: ZOFRAN-ODT TAKE 1 TABLET BY MOUTH EVERY 8 HOURS AS NEEDED FOR NAUSEA AND VOMITING   pantoprazole 40 MG tablet Commonly known as: PROTONIX Take 1 tablet (40 mg total) by mouth 2 (two) times daily before a meal.   pravastatin 40 MG tablet Commonly known as: PRAVACHOL TAKE 1 TABLET BY MOUTH EVERY DAY IN THE EVENING   sevelamer carbonate 800 MG tablet Commonly known as: Renvela Take 2 tablets (1,600 mg total) by mouth as needed (snacks).   sevelamer carbonate 800 MG tablet Commonly known as: Renvela Take 4 tablets (3,200 mg total) by mouth 3 (three) times daily with meals.   sucralfate 1 GM/10ML suspension Commonly known as: CARAFATE Take 10 mLs (1 g total) by mouth 4 (four) times daily -  with meals and at bedtime.   traZODone 50 MG tablet Commonly known as: DESYREL Take 0.5-1 tablets (25-50 mg total) by mouth at bedtime as needed. for sleep   Vitamin D3 25 MCG (1000 UT) Caps Take 4,000 Units by mouth daily.   vortioxetine HBr 5 MG Tabs tablet Commonly known as: Trintellix Take 1 tablet (5 mg total) by mouth daily.        Review of Systems  HENT:  Positive for hearing loss.   Musculoskeletal:  Positive for arthralgias, back pain and neck pain.    Immunization History  Administered Date(s) Administered  . Influenza Whole 11/01/2010  . Influenza, Seasonal, Injecte, Preservative Fre 10/29/2012  . Influenza-Unspecified 11/21/2011, 11/05/2012, 09/22/2013, 09/22/2017, 11/23/2019, 10/28/2020, 12/04/2020  . Moderna Sars-Covid-2 Vaccination 04/06/2019, 05/07/2019, 03/31/2020  . PPD Test 12/26/2012  .  Pneumococcal Polysaccharide-23 11/01/2010, 10/29/2012  . Pneumococcal-Unspecified 11/05/2012  . Tdap 01/22/2006, 06/08/2021   Pertinent  Health Maintenance Due  Topic Date Due  . INFLUENZA VACCINE  08/23/2022  . HEMOGLOBIN A1C  11/21/2022  . Colonoscopy  12/29/2026  . FOOT EXAM  Discontinued  . OPHTHALMOLOGY EXAM  Discontinued      12/01/2021    8:00 AM 12/07/2021  11:07 AM 02/28/2022    3:52 PM 09/18/2022    9:07 AM 10/02/2022   11:25 AM  Fall Risk  Falls in the past year?  0 1 1 0  Was there an injury with Fall?  0 1 0   Fall Risk Category Calculator  0 3 2   Fall Risk Category (Retired)  Low     (RETIRED) Patient Fall Risk Level High fall risk Low fall risk     Patient at Risk for Falls Due to  No Fall Risks No Fall Risks History of fall(s)   Fall risk Follow up  Falls evaluation completed Falls evaluation completed Falls evaluation completed    Functional Status Survey:    Vitals:   10/02/22 1126  BP: 130/80  Pulse: 65  Resp: 14  Temp: 97.8 F (36.6 C)  SpO2: 95%  Weight: 197 lb (89.4 kg)  Height: 5\' 7"  (1.702 m)   Body mass index is 30.85 kg/m. Physical Exam Musculoskeletal:     Cervical back: Decreased range of motion.  Neurological:     Gait: Gait abnormal.     Comments: Decreased monofilament sensation to both heels due to thick dry skin.    Labs reviewed: Recent Labs    05/07/22 0327 05/08/22 0227 05/09/22 0224 07/24/22 1432 07/30/22 1249  NA 135 135 137 140 135  K 5.5* 5.8* 4.4 4.9 4.5  CL 96* 97* 97* 98 95*  CO2 22 26 27 29 26   GLUCOSE 102* 215* 99 111* 104*  BUN 98* 66* 56* 47* 54*  CREATININE 12.34* 9.30* 7.52* 9.87* 10.20*  CALCIUM 9.7 9.1 9.3 8.6* 8.6*  MG 2.2 1.9 1.9  --   --   PHOS 4.6 4.8* 5.9*  --   --    Recent Labs    05/08/22 0227 05/09/22 0224 07/24/22 1432  AST 17 12* 12*  ALT 18 17 9   ALKPHOS 53 50 82  BILITOT 1.1 0.7 0.6  PROT 5.8* 6.0* 6.5  ALBUMIN 3.2* 3.3* 3.9   Recent Labs    05/22/22 1409 07/24/22 1432  07/30/22 1249  WBC 3.0* 5.2 3.8*  NEUTROABS 1,884 3.6 2.7  HGB 10.4* 12.2* 10.8*  HCT 32.3* 36.4* 32.8*  MCV 95.3 97.3 97.9  PLT 59* 114* 83*   Lab Results  Component Value Date   TSH 2.245 11/16/2021   Lab Results  Component Value Date   HGBA1C 5.3 05/22/2022   Lab Results  Component Value Date   CHOL 114 05/05/2022   HDL 41 05/05/2022   LDLCALC 56 05/05/2022   TRIG 86 05/05/2022   CHOLHDL 2.8 05/05/2022    Significant Diagnostic Results in last 30 days:  No results found.  Assessment/Plan There are no diagnoses linked to this encounter.   Family/ staff Communication: Reviewed plan of care with patient  Labs/tests ordered: None   Next Appointment :   Caesar Bookman, NP

## 2022-10-03 DIAGNOSIS — D631 Anemia in chronic kidney disease: Secondary | ICD-10-CM | POA: Diagnosis not present

## 2022-10-03 DIAGNOSIS — N186 End stage renal disease: Secondary | ICD-10-CM | POA: Diagnosis not present

## 2022-10-03 DIAGNOSIS — N2581 Secondary hyperparathyroidism of renal origin: Secondary | ICD-10-CM | POA: Diagnosis not present

## 2022-10-03 DIAGNOSIS — Z992 Dependence on renal dialysis: Secondary | ICD-10-CM | POA: Diagnosis not present

## 2022-10-04 ENCOUNTER — Ambulatory Visit: Payer: Self-pay

## 2022-10-04 NOTE — Patient Instructions (Signed)
Visit Information  Thank you for taking time to visit with me today. Please don't hesitate to contact me if I can be of assistance to you.   Following are the goals we discussed today:   Goals Addressed             This Visit's Progress    COMPLETED: RN Care Coordination Activities       Care Coordination Interventions: Assessed for signs and symptoms of orthostatic hypotension Assessed for falls since last encounter Assessed patients knowledge of fall risk prevention secondary to previously provided education     To follow up with Psychiatry   On track    Care Coordination Interventions: Completed successful outbound call with wife Randa Evens Evaluation of current treatment plan related to sleep disorder and depression  and patient's adherence to plan as established by provider Confirmed patient completed his new patient follow up with Dr. Cyndie Chime for evaluation of worsening depression and insomnia  Review of patient status, including review of consultant's reports, relevant laboratory and other test results, and medications completed Determined patient was satisfied with the care he received from this provider and plans to continue his therapy as directed Reviewed and discussed next scheduled follow up with Dr. Cyndie Chime       To have chronic pain evaluated   On track    Care Coordination Interventions: Reviewed provider established plan for pain management Discussed importance of adherence to all scheduled medical appointments Discussed use of relaxation techniques and/or diversional activities to assist with pain reduction (distraction, imagery, relaxation, massage, acupressure, TENS, heat, and cold application Reviewed with patient prescribed pharmacological and nonpharmacological pain relief strategies        Our next appointment is by telephone on 01/01/23 at 1:30 PM   Please call the care guide team at [phone] if you need to cancel or reschedule your appointment.   If  you are experiencing a Mental Health or Behavioral Health Crisis or need someone to talk to, please call 1-800-273-TALK (toll free, 24 hour hotline)  Patient verbalizes understanding of instructions and care plan provided today and agrees to view in MyChart. Active MyChart status and patient understanding of how to access instructions and care plan via MyChart confirmed with patient.     Delsa Sale RN BSN CCM   [facility], [facility] Nurse Care Coordinator  Direct Dial: [phone] Website: [email or web address]

## 2022-10-04 NOTE — Patient Outreach (Signed)
  Care Coordination   Follow Up Visit Note   10/04/2022 Name: DETRIC SHAHEEN MRN: [id number] DOB: October 03, 1960  [name] is a 62 y.o. year old male who sees Ngetich, Dinah C, NP for primary care. I spoke with wife Savaughn Essler by phone today.  What matters to the patients health and wellness today?  Patient would like to continue to manage his chronic health conditions without complications.     Goals Addressed             This Visit's Progress    COMPLETED: RN Care Coordination Activities       Care Coordination Interventions: Assessed for signs and symptoms of orthostatic hypotension Assessed for falls since last encounter Assessed patients knowledge of fall risk prevention secondary to previously provided education     To follow up with Psychiatry   On track    Care Coordination Interventions: Completed successful outbound call with wife Randa Evens Evaluation of current treatment plan related to sleep disorder and depression  and patient's adherence to plan as established by provider Confirmed patient completed his new patient follow up with Dr. Cyndie Chime for evaluation of worsening depression and insomnia  Review of patient status, including review of consultant's reports, relevant laboratory and other test results, and medications completed Determined patient was satisfied with the care he received from this provider and plans to continue his therapy as directed Reviewed and discussed next scheduled follow up with Dr. Cyndie Chime      To have chronic pain evaluated   On track    Care Coordination Interventions: Reviewed provider established plan for pain management Discussed importance of adherence to all scheduled medical appointments Discussed use of relaxation techniques and/or diversional activities to assist with pain reduction (distraction, imagery, relaxation, massage, acupressure, TENS, heat, and cold application Reviewed with patient prescribed pharmacological and  nonpharmacological pain relief strategies    Interventions Today    Flowsheet Row Most Recent Value  Chronic Disease   Chronic disease during today's visit Other, Chronic Obstructive Pulmonary Disease (COPD)  [headaches,  Asthma,  bronchitis]  General Interventions   General Interventions Discussed/Reviewed General Interventions Discussed, General Interventions Reviewed, Doctor Visits  Doctor Visits Discussed/Reviewed Doctor Visits Discussed, Doctor Visits Reviewed, PCP, Specialist  Education Interventions   Education Provided Provided Education  Provided Verbal Education On When to see the doctor, Medication  Mental Health Interventions   Mental Health Discussed/Reviewed Mental Health Discussed, Mental Health Reviewed, Coping Strategies  Pharmacy Interventions   Pharmacy Dicussed/Reviewed Pharmacy Topics Discussed, Pharmacy Topics Reviewed, Medications and their functions          SDOH assessments and interventions completed:  No     Care Coordination Interventions:  Yes, provided   Follow up plan: Follow up call scheduled for 01/03/23 @1 :30 PM    Encounter Outcome:  Patient Visit Completed

## 2022-10-05 DIAGNOSIS — Z992 Dependence on renal dialysis: Secondary | ICD-10-CM | POA: Diagnosis not present

## 2022-10-05 DIAGNOSIS — D631 Anemia in chronic kidney disease: Secondary | ICD-10-CM | POA: Diagnosis not present

## 2022-10-05 DIAGNOSIS — N186 End stage renal disease: Secondary | ICD-10-CM | POA: Diagnosis not present

## 2022-10-05 DIAGNOSIS — N2581 Secondary hyperparathyroidism of renal origin: Secondary | ICD-10-CM | POA: Diagnosis not present

## 2022-10-08 DIAGNOSIS — Z992 Dependence on renal dialysis: Secondary | ICD-10-CM | POA: Diagnosis not present

## 2022-10-08 DIAGNOSIS — D631 Anemia in chronic kidney disease: Secondary | ICD-10-CM | POA: Diagnosis not present

## 2022-10-08 DIAGNOSIS — N186 End stage renal disease: Secondary | ICD-10-CM | POA: Diagnosis not present

## 2022-10-08 DIAGNOSIS — N2581 Secondary hyperparathyroidism of renal origin: Secondary | ICD-10-CM | POA: Diagnosis not present

## 2022-10-10 DIAGNOSIS — D631 Anemia in chronic kidney disease: Secondary | ICD-10-CM | POA: Diagnosis not present

## 2022-10-10 DIAGNOSIS — N186 End stage renal disease: Secondary | ICD-10-CM | POA: Diagnosis not present

## 2022-10-10 DIAGNOSIS — Z992 Dependence on renal dialysis: Secondary | ICD-10-CM | POA: Diagnosis not present

## 2022-10-10 DIAGNOSIS — N2581 Secondary hyperparathyroidism of renal origin: Secondary | ICD-10-CM | POA: Diagnosis not present

## 2022-10-12 DIAGNOSIS — N2581 Secondary hyperparathyroidism of renal origin: Secondary | ICD-10-CM | POA: Diagnosis not present

## 2022-10-12 DIAGNOSIS — N186 End stage renal disease: Secondary | ICD-10-CM | POA: Diagnosis not present

## 2022-10-12 DIAGNOSIS — D631 Anemia in chronic kidney disease: Secondary | ICD-10-CM | POA: Diagnosis not present

## 2022-10-12 DIAGNOSIS — Z992 Dependence on renal dialysis: Secondary | ICD-10-CM | POA: Diagnosis not present

## 2022-10-13 ENCOUNTER — Other Ambulatory Visit: Payer: Self-pay | Admitting: Primary Care

## 2022-10-15 DIAGNOSIS — N2581 Secondary hyperparathyroidism of renal origin: Secondary | ICD-10-CM | POA: Diagnosis not present

## 2022-10-15 DIAGNOSIS — D631 Anemia in chronic kidney disease: Secondary | ICD-10-CM | POA: Diagnosis not present

## 2022-10-15 DIAGNOSIS — Z992 Dependence on renal dialysis: Secondary | ICD-10-CM | POA: Diagnosis not present

## 2022-10-15 DIAGNOSIS — N186 End stage renal disease: Secondary | ICD-10-CM | POA: Diagnosis not present

## 2022-10-17 ENCOUNTER — Telehealth: Payer: Self-pay | Admitting: Primary Care

## 2022-10-17 DIAGNOSIS — N2581 Secondary hyperparathyroidism of renal origin: Secondary | ICD-10-CM | POA: Diagnosis not present

## 2022-10-17 DIAGNOSIS — Z992 Dependence on renal dialysis: Secondary | ICD-10-CM | POA: Diagnosis not present

## 2022-10-17 DIAGNOSIS — N186 End stage renal disease: Secondary | ICD-10-CM | POA: Diagnosis not present

## 2022-10-17 DIAGNOSIS — D631 Anemia in chronic kidney disease: Secondary | ICD-10-CM | POA: Diagnosis not present

## 2022-10-17 NOTE — Telephone Encounter (Signed)
[name] wife states patient having symptoms of cough and wheezing. Pharmacy is [facility]. [name] phone number is [phone] and [phone].

## 2022-10-18 NOTE — Telephone Encounter (Signed)
I don't have any other ideas but sounds like he needs to be seen again with all active meds in hand before the weekend - I can add him on in RDS if he can come to this clinic  and if not will need to go to [facility]

## 2022-10-18 NOTE — Telephone Encounter (Signed)
Symptoms since 8/27 when he saw Buelah Manis, NP. Wife says it seems to be getting worse.  No fevers, chill, or sweats. Cough with clear sputum Increased SOB.  Has finsihed the antibiotics.  Wheezing Does not monitor O2 at home  Using Symbicort- BID  [facility]

## 2022-10-19 DIAGNOSIS — N186 End stage renal disease: Secondary | ICD-10-CM | POA: Diagnosis not present

## 2022-10-19 DIAGNOSIS — D631 Anemia in chronic kidney disease: Secondary | ICD-10-CM | POA: Diagnosis not present

## 2022-10-19 DIAGNOSIS — N2581 Secondary hyperparathyroidism of renal origin: Secondary | ICD-10-CM | POA: Diagnosis not present

## 2022-10-19 DIAGNOSIS — Z992 Dependence on renal dialysis: Secondary | ICD-10-CM | POA: Diagnosis not present

## 2022-10-19 NOTE — Telephone Encounter (Signed)
Noted. Nothing further needed. 

## 2022-10-19 NOTE — Telephone Encounter (Signed)
Wife was called and I notified her of Dr.Wert's suggestions. She stated that she would wait to see how the weather turns out. I also notified her that her husband is currently scheduled for Tuesday.

## 2022-10-22 DIAGNOSIS — Z992 Dependence on renal dialysis: Secondary | ICD-10-CM | POA: Diagnosis not present

## 2022-10-22 DIAGNOSIS — N186 End stage renal disease: Secondary | ICD-10-CM | POA: Diagnosis not present

## 2022-10-22 DIAGNOSIS — N2581 Secondary hyperparathyroidism of renal origin: Secondary | ICD-10-CM | POA: Diagnosis not present

## 2022-10-22 DIAGNOSIS — D631 Anemia in chronic kidney disease: Secondary | ICD-10-CM | POA: Diagnosis not present

## 2022-10-23 ENCOUNTER — Encounter: Payer: Self-pay | Admitting: Primary Care

## 2022-10-23 ENCOUNTER — Ambulatory Visit (INDEPENDENT_AMBULATORY_CARE_PROVIDER_SITE_OTHER): Payer: Medicare Other | Admitting: Primary Care

## 2022-10-23 ENCOUNTER — Ambulatory Visit: Payer: Medicare Other

## 2022-10-23 VITALS — BP 154/78 | HR 74 | Ht 67.0 in | Wt 202.0 lb

## 2022-10-23 DIAGNOSIS — J209 Acute bronchitis, unspecified: Secondary | ICD-10-CM

## 2022-10-23 DIAGNOSIS — J441 Chronic obstructive pulmonary disease with (acute) exacerbation: Secondary | ICD-10-CM

## 2022-10-23 DIAGNOSIS — R0602 Shortness of breath: Secondary | ICD-10-CM

## 2022-10-23 DIAGNOSIS — N186 End stage renal disease: Secondary | ICD-10-CM | POA: Diagnosis not present

## 2022-10-23 DIAGNOSIS — Z992 Dependence on renal dialysis: Secondary | ICD-10-CM | POA: Diagnosis not present

## 2022-10-23 DIAGNOSIS — T8612 Kidney transplant failure: Secondary | ICD-10-CM | POA: Diagnosis not present

## 2022-10-23 MED ORDER — PREDNISONE 10 MG PO TABS: ORAL_TABLET | ORAL | 0 refills | Status: DC

## 2022-10-23 MED ORDER — METHYLPREDNISOLONE ACETATE 80 MG/ML IJ SUSP
80.0000 mg | Freq: Once | INTRAMUSCULAR | Status: AC
Start: 2022-10-23 — End: 2022-10-23
  Administered 2022-10-23: 80 mg via INTRAMUSCULAR

## 2022-10-23 NOTE — Patient Instructions (Addendum)
Recommendations: Take mucinex 600-1200mg  twice daily x 5-7 days  (not with robitussin)  Orders: Depo-medrol 80mg  IM x1 CXR and labs   Rx: Prednisone taper   Follow-up: 2-4 weeks with Beth NP (ok to double book on day I am not already- write in apt note that it was ok'd by me)

## 2022-10-23 NOTE — Progress Notes (Signed)
@Patient  ID: Kirk [name], male    DOB: Mar 03, 1960, 62 y.o.   MRN: [id number]  No chief complaint on file.   Referring provider: Caesar Bookman, NP  HPI: 62 year old male, Former smoker quit in April 2022 (16-pack-year history).  Past medical history significant for mild persistent asthma, systolic heart failure, hypertension, OSA on CPAP, type 2 diabetes, end-stage renal disease on dialysis.  Patient of Dr. Sherene Sires.  Maintained on Symbicort 160, as needed albuterol HFA or DuoNeb.  Spirometry in February 2023 showed moderate airway obstruction/FEV 1 42%.   Previous [facility] pulmonary encounter: 09/18/2022 Patient presents today for overdue follow-up asthma. Patient follows with Dr. Sherene Sires. Reports increased shortness of breath symptoms with exertion and cough since having rhinovirus and RSV in April 2024. These symptoms are not new. He gets winded when walking. He ran out of symbicort last month. Symptoms were previously well controlled. He has associated sinus congestion/PND. Cough is productive with clear sputum is clear. He has not tried any nasal sprays. CT chest in April 2024 showed mucus plugging, mild consolidations and numerous small pulmonary nodules most prominent in lower lungs, slightly worse when compared to prior exam.  Findings felt to be likely due to infection or aspiration. CXR in July without acute cardiopulmonary process.   10/23/2022- interim hx  Patient was treated for acute bronchitis in August with the doxycycline and Robitussin DM.  Asthma symptoms did not appear acutely exacerbated.  He is maintained on Symbicort 160. CXR in July was normal.  Accompanied by his wife/ daughter. Cough no better. He coughs every day. Cough is occasionally productive. Dyspnea is prominent with exertion. Now having wheezing symptoms.    Allergies  Allergen Reactions   Codeine Nausea And Vomiting   Hydrocodone-Acetaminophen Nausea And Vomiting    Immunization History  Administered Date(s)  Administered   Influenza Whole 11/01/2010   Influenza, Seasonal, Injecte, Preservative Fre 10/29/2012   Influenza-Unspecified 11/21/2011, 11/05/2012, 09/22/2013, 09/22/2017, 11/23/2019, 10/28/2020, 12/04/2020   Moderna Sars-Covid-2 Vaccination 04/06/2019, 05/07/2019, 03/31/2020   PPD Test 12/26/2012   Pneumococcal Polysaccharide-23 11/01/2010, 10/29/2012   Pneumococcal-Unspecified 11/05/2012   Tdap 01/22/2006, 06/08/2021    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)    Anxiety    Asthma    Asthma    moderate persistent   Carpal tunnel syndrome    Cellulitis and abscess of trunk    Cholelithiasis 07/13/2014   Chronic headaches    Cigarette smoker 07/11/2017   Stopped 2/022  - referred to start Lung cancer screening 04/2021         Debility, unspecified    Dermatophytosis of the body    Dysrhythmia    history of   Edema    End stage renal disease on dialysis (HCC)    "MWF; [facility] in [place]" (10/21/2014)   Essential hypertension, benign    Generalized anxiety disorder 05/25/2022   GERD (gastroesophageal reflux disease)    Gout, unspecified    HTN (hypertension)    Hypertrophy of prostate without urinary obstruction and other lower urinary tract symptoms (LUTS)    Hypotension, unspecified    Impotence of organic origin    Insomnia, unspecified    Kidney replaced by transplant    Localization-related (focal) (partial) epilepsy and epileptic syndromes with complex partial seizures, without mention of intractable epilepsy  12-15-19- Wife states he has NEVER had a seizure    Lumbago    Memory loss    OSA on CPAP    Other and unspecified hyperlipidemia    controlled /managed per wife    Other chronic nonalcoholic liver disease    Other malaise and fatigue    Other nonspecific abnormal serum enzyme levels    Pain  in joint, lower leg    Pain in joint, upper arm    Pneumonia "several times"   PONV (postoperative nausea and vomiting)    Renal dialysis status(V45.11) 02/05/2010   restarted 01/02/13 ofter renal trransplant failure   Secondary hyperparathyroidism (of renal origin)    Shortness of breath    Sleep apnea    wears cpap    Tension headache    Unspecified constipation    Unspecified essential hypertension    Unspecified hereditary and idiopathic peripheral neuropathy    Unspecified vitamin D deficiency     Tobacco History: Social History   Tobacco Use  Smoking Status Former   Current packs/day: 0.00   Average packs/day: 0.5 packs/day for 32.0 years (16.0 ttl pk-yrs)   Types: Cigarettes   Start date: 61   Quit date: 02/24/2020   Years since quitting: 2.6  Smokeless Tobacco Never   Counseling given: Not Answered   Outpatient Medications Prior to Visit  Medication Sig Dispense Refill   acetaminophen (TYLENOL) 500 MG tablet Take 1 tablet (500 mg total) by mouth every 6 (six) hours as needed for moderate pain.     amLODipine (NORVASC) 10 MG tablet Take 1 tablet (10 mg total) by mouth every evening.     aspirin 81 MG chewable tablet Chew 81 mg by mouth daily.     Azelastine HCl 137 MCG/SPRAY SOLN PLACE 1 SPRAY INTO BOTH NOSTRILS 2 (TWO) TIMES DAILY. USE IN EACH NOSTRIL AS DIRECTED 90 mL 0   budesonide-formoterol (SYMBICORT) 160-4.5 MCG/ACT inhaler Inhale 2 puffs into the lungs as needed. 1 each 5   calcitRIOL (ROCALTROL) 0.5 MCG capsule Take 2 capsules (1 mcg total) by mouth every Monday, Wednesday, and Friday with hemodialysis. 60 capsule 1   Cholecalciferol (VITAMIN D3) 25 MCG (1000 UT) CAPS Take 4,000 Units by mouth daily.     cinacalcet (SENSIPAR) 30 MG tablet Take 2 tablets (60 mg total) by mouth 3 (three) times a week. 60 tablet    diphenhydrAMINE (BENADRYL) 25 mg capsule Take 25 mg by mouth every 6 (six) hours as needed for allergies.     doxazosin (CARDURA) 8 MG tablet Take  1 tablet (8 mg total) by mouth daily. 90 tablet 3   doxycycline (VIBRA-TABS) 100 MG tablet Take 1 tablet (100 mg total) by mouth 2 (two) times daily. 20 tablet 0   ethyl chloride spray SMARTSIG:1 Spray(s) Topical 3 Times a Week     FOSRENOL 1000 MG PACK Take 1 packet by mouth 3 (three) times daily.     guaiFENesin-dextromethorphan (ROBITUSSIN DM) 100-10 MG/5ML syrup Take 5 mLs by mouth every 4 (four) hours as needed for cough. 118 mL 0   hydrALAZINE (APRESOLINE) 100 MG tablet Take 1 tablet (100 mg total) by mouth 3 (three) times daily. 270 tablet 3   isosorbide mononitrate (IMDUR) 120 MG 24 hr tablet Take 1 tablet (120 mg total) by mouth daily. 90 tablet 3   methocarbamol (ROBAXIN) 750 MG tablet Take 1 tablet (750 mg total) by mouth 4 (four) times daily. 90 tablet 3   metoprolol tartrate (LOPRESSOR) 100 MG tablet  TAKE 1 TABLET BY MOUTH TWICE A DAY 180 tablet 1   multivitamin (RENA-VIT) TABS tablet Take 1 tablet by mouth daily.  0   ondansetron (ZOFRAN-ODT) 4 MG disintegrating tablet TAKE 1 TABLET BY MOUTH EVERY 8 HOURS AS NEEDED FOR NAUSEA AND VOMITING 30 tablet 3   pantoprazole (PROTONIX) 40 MG tablet Take 1 tablet (40 mg total) by mouth 2 (two) times daily before a meal. 60 tablet 3   pravastatin (PRAVACHOL) 40 MG tablet TAKE 1 TABLET BY MOUTH EVERY DAY IN THE EVENING 90 tablet 2   sevelamer carbonate (RENVELA) 800 MG tablet Take 2 tablets (1,600 mg total) by mouth as needed (snacks).     sevelamer carbonate (RENVELA) 800 MG tablet Take 4 tablets (3,200 mg total) by mouth 3 (three) times daily with meals.     sucralfate (CARAFATE) 1 GM/10ML suspension Take 10 mLs (1 g total) by mouth 4 (four) times daily -  with meals and at bedtime. 420 mL 0   traZODone (DESYREL) 50 MG tablet Take 0.5-1 tablets (25-50 mg total) by mouth at bedtime as needed. for sleep 90 tablet 1   vortioxetine HBr (TRINTELLIX) 5 MG TABS tablet Take 1 tablet (5 mg total) by mouth daily. 30 tablet 1   No facility-administered  medications prior to visit.   Review of Systems  Review of Systems  Constitutional: Negative.   HENT:  Positive for congestion.   Respiratory:  Positive for cough, shortness of breath and wheezing.   Cardiovascular: Negative.  Negative for palpitations.   Physical Exam  There were no vitals taken for this visit. Physical Exam Constitutional:      Appearance: Normal appearance.  HENT:     Head: Normocephalic and atraumatic.  Cardiovascular:     Rate and Rhythm: Normal rate and regular rhythm.  Pulmonary:     Effort: Pulmonary effort is normal.     Breath sounds: Normal breath sounds. No wheezing or rhonchi.     Comments: Congested cough Neurological:     General: No focal deficit present.     Mental Status: He is alert and oriented to person, place, and time. Mental status is at baseline.  Psychiatric:        Mood and Affect: Mood normal.        Behavior: Behavior normal.        Thought Content: Thought content normal.        Judgment: Judgment normal.      Lab Results:  CBC    Component Value Date/Time   WBC 3.8 (L) 07/30/2022 1249   RBC 3.35 (L) 07/30/2022 1249   HGB 10.8 (L) 07/30/2022 1249   HGB 12.2 (L) 07/24/2022 1432   HCT 32.8 (L) 07/30/2022 1249   PLT 83 (L) 07/30/2022 1249   PLT 114 (L) 07/24/2022 1432   MCV 97.9 07/30/2022 1249   MCH 32.2 07/30/2022 1249   MCHC 32.9 07/30/2022 1249   RDW 13.2 07/30/2022 1249   RDW 16.7 (H) 11/26/2013 0914   LYMPHSABS 0.7 07/30/2022 1249   LYMPHSABS 1.3 11/26/2013 0914   MONOABS 0.4 07/30/2022 1249   EOSABS 0.1 07/30/2022 1249   EOSABS 0.2 11/26/2013 0914   BASOSABS 0.0 07/30/2022 1249   BASOSABS 0.1 11/26/2013 0914    BMET    Component Value Date/Time   NA 135 07/30/2022 1249   NA 136 11/26/2013 0914   K 4.5 07/30/2022 1249   CL 95 (L) 07/30/2022 1249   CO2 26 07/30/2022 1249   GLUCOSE 104 (  H) 07/30/2022 1249   BUN 54 (H) 07/30/2022 1249   BUN 66 (A) 05/25/2015 0000   BUN 66 (A) 05/25/2015 0000    CREATININE 10.20 (H) 07/30/2022 1249   CREATININE 9.87 (HH) 07/24/2022 1432   CREATININE 8.37 (H) 02/03/2018 1523   CALCIUM 8.6 (L) 07/30/2022 1249   CALCIUM 8.8 06/08/2015 0000   CALCIUM 7.9 (L) 01/19/2010 1016   GFRNONAA 5 (L) 07/30/2022 1249   GFRNONAA 6 (L) 07/24/2022 1432   GFRAA 4 (L) 07/15/2019 0704    BNP    Component Value Date/Time   BNP 1,139.8 (H) 12/02/2018 2031    ProBNP    Component Value Date/Time   PROBNP 725.4 (H) 08/04/2011 0520    Imaging: No results found.   Assessment & Plan:    1. Chronic bronchitis with acute exacerbation (HCC) (Primary) - DG Chest 2 View; Future - CBC with Differential; Future - methylPREDNISolone acetate (DEPO-MEDROL) injection 80 mg  2. Shortness of breath - methylPREDNISolone acetate (DEPO-MEDROL) injection 80 mg  COPD with acute exacerbations Patient reports having symptoms including cough, congestion, shortness of breath and wheezing. Patient receive depo-medrol 80mg  IM x1. Checking CXE and CBC with diff. Sending in prednisone taper. Continue Advair 115-48mcg two puffs twice daily and Albuterol HFA/nebulizer every 6 hours    Glenford Bayley, NP 10/23/2022

## 2022-10-24 DIAGNOSIS — Z992 Dependence on renal dialysis: Secondary | ICD-10-CM | POA: Diagnosis not present

## 2022-10-24 DIAGNOSIS — D631 Anemia in chronic kidney disease: Secondary | ICD-10-CM | POA: Diagnosis not present

## 2022-10-24 DIAGNOSIS — N2581 Secondary hyperparathyroidism of renal origin: Secondary | ICD-10-CM | POA: Diagnosis not present

## 2022-10-24 DIAGNOSIS — E8770 Fluid overload, unspecified: Secondary | ICD-10-CM | POA: Diagnosis not present

## 2022-10-24 DIAGNOSIS — N186 End stage renal disease: Secondary | ICD-10-CM | POA: Diagnosis not present

## 2022-10-24 DIAGNOSIS — Z23 Encounter for immunization: Secondary | ICD-10-CM | POA: Diagnosis not present

## 2022-10-25 ENCOUNTER — Emergency Department (HOSPITAL_COMMUNITY)
Admission: EM | Admit: 2022-10-25 | Discharge: 2022-10-25 | Disposition: A | Discharge status: Home / Self Care | Payer: Medicare Other | Attending: Emergency Medicine | Admitting: Emergency Medicine

## 2022-10-25 ENCOUNTER — Other Ambulatory Visit: Payer: Self-pay

## 2022-10-25 ENCOUNTER — Emergency Department (HOSPITAL_COMMUNITY): Payer: Medicare Other

## 2022-10-25 DIAGNOSIS — E875 Hyperkalemia: Secondary | ICD-10-CM | POA: Diagnosis not present

## 2022-10-25 DIAGNOSIS — R059 Cough, unspecified: Secondary | ICD-10-CM | POA: Diagnosis not present

## 2022-10-25 DIAGNOSIS — Z7982 Long term (current) use of aspirin: Secondary | ICD-10-CM | POA: Insufficient documentation

## 2022-10-25 DIAGNOSIS — J9801 Acute bronchospasm: Secondary | ICD-10-CM | POA: Diagnosis not present

## 2022-10-25 DIAGNOSIS — R0602 Shortness of breath: Secondary | ICD-10-CM | POA: Diagnosis not present

## 2022-10-25 LAB — CBC WITH DIFFERENTIAL/PLATELET
Abs Immature Granulocytes: 0.01 10*3/uL (ref 0.00–0.07)
Basophils Absolute: 0 10*3/uL (ref 0.0–0.1)
Basophils Relative: 0 %
Eosinophils Absolute: 0 10*3/uL (ref 0.0–0.5)
Eosinophils Relative: 0 %
HCT: 34.7 % — ABNORMAL LOW (ref 39.0–52.0)
Hemoglobin: 11.1 g/dL — ABNORMAL LOW (ref 13.0–17.0)
Immature Granulocytes: 0 %
Lymphocytes Relative: 12 %
Lymphs Abs: 0.4 10*3/uL — ABNORMAL LOW (ref 0.7–4.0)
MCH: 31.8 pg (ref 26.0–34.0)
MCHC: 32 g/dL (ref 30.0–36.0)
MCV: 99.4 fL (ref 80.0–100.0)
Monocytes Absolute: 0.1 10*3/uL (ref 0.1–1.0)
Monocytes Relative: 3 %
Neutro Abs: 2.9 10*3/uL (ref 1.7–7.7)
Neutrophils Relative %: 85 %
Platelets: 81 10*3/uL — ABNORMAL LOW (ref 150–400)
RBC: 3.49 MIL/uL — ABNORMAL LOW (ref 4.22–5.81)
RDW: 15.1 % (ref 11.5–15.5)
WBC: 3.4 10*3/uL — ABNORMAL LOW (ref 4.0–10.5)
nRBC: 0 % (ref 0.0–0.2)

## 2022-10-25 LAB — BASIC METABOLIC PANEL
Anion gap: 14 (ref 5–15)
BUN: 28 mg/dL — ABNORMAL HIGH (ref 6–20)
CO2: 27 mmol/L (ref 22–32)
Calcium: 9.2 mg/dL (ref 8.9–10.3)
Chloride: 97 mmol/L — ABNORMAL LOW (ref 98–111)
Creatinine, Ser: 8.3 mg/dL — ABNORMAL HIGH (ref 0.61–1.24)
GFR, Estimated: 7 mL/min — ABNORMAL LOW (ref >60)
Glucose, Bld: 156 mg/dL — ABNORMAL HIGH (ref 70–99)
Potassium: 5.3 mmol/L — ABNORMAL HIGH (ref 3.5–5.1)
Sodium: 138 mmol/L (ref 135–145)

## 2022-10-25 MED ORDER — ALBUTEROL SULFATE HFA 108 (90 BASE) MCG/ACT IN AERS
2.0000 | INHALATION_SPRAY | RESPIRATORY_TRACT | Status: DC | PRN
  Administered 2022-10-25: 2 via RESPIRATORY_TRACT
  Filled 2022-10-25: qty 6.7

## 2022-10-25 MED ORDER — AEROCHAMBER PLUS FLO-VU LARGE MISC
1.0000 | Freq: Once | Status: AC
  Administered 2022-10-25: 1

## 2022-10-25 MED ORDER — IPRATROPIUM-ALBUTEROL 0.5-2.5 (3) MG/3ML IN SOLN
3.0000 mL | Freq: Once | RESPIRATORY_TRACT | Status: AC
  Administered 2022-10-25: 3 mL via RESPIRATORY_TRACT
  Filled 2022-10-25: qty 3

## 2022-10-25 MED ORDER — SODIUM ZIRCONIUM CYCLOSILICATE 10 G PO PACK
10.0000 g | PACK | ORAL | Status: AC
  Administered 2022-10-25: 10 g via ORAL
  Filled 2022-10-25: qty 1

## 2022-10-25 MED ORDER — ALBUTEROL SULFATE (2.5 MG/3ML) 0.083% IN NEBU
2.5000 mg | INHALATION_SOLUTION | Freq: Once | RESPIRATORY_TRACT | Status: AC
  Administered 2022-10-25: 2.5 mg via RESPIRATORY_TRACT
  Filled 2022-10-25: qty 3

## 2022-10-25 NOTE — ED Triage Notes (Signed)
Pt arrives to ED c/o SOB and coughing up sputum x 2 months. Pt reports sputum is clear and denies fever. Pt with respiratory and dialysis hx.

## 2022-10-25 NOTE — ED Provider Notes (Signed)
EMERGENCY DEPARTMENT AT [facility] Provider Note   CSN: [id number] Arrival date & time: 10/25/22  0457     History  Chief Complaint  Patient presents with   Shortness of Breath   Cough    Kirk [name] is a 62 y.o. male.  Patient presents to the emergency department for evaluation of difficulty breathing.  Patient reports that he has not been doing well for 1 to 2 months.  He has been experiencing increased cough, shortness of breath.  Cough productive of clear sputum.  He saw his pulmonologist this past week and was placed on steroids but still is coughing.  He is a dialysis patient, has not missed any dialysis sessions.  He goes Monday Wednesday Friday.       Home Medications Prior to Admission medications   Medication Sig Start Date End Date Taking? Authorizing Provider  acetaminophen (TYLENOL) 500 MG tablet Take 1 tablet (500 mg total) by mouth every 6 (six) hours as needed for moderate pain. 09/12/14   Standley Brooking, MD  amLODipine (NORVASC) 10 MG tablet Take 1 tablet (10 mg total) by mouth every evening. 11/29/21   Angiulli, Mcarthur Rossetti, PA-C  aspirin 81 MG chewable tablet Chew 81 mg by mouth daily. 09/28/20   [provider]  Azelastine HCl 137 MCG/SPRAY SOLN PLACE 1 SPRAY INTO BOTH NOSTRILS 2 (TWO) TIMES DAILY. USE IN [facility] NOSTRIL AS DIRECTED 10/20/22   Glenford Bayley, NP  budesonide-formoterol [facility]) 160-4.5 MCG/ACT inhaler Inhale 2 puffs into the lungs as needed. 09/18/22   Glenford Bayley, NP  calcitRIOL (ROCALTROL) 0.5 MCG capsule Take 2 capsules (1 mcg total) by mouth every Monday, Wednesday, and Friday with hemodialysis. 06/04/18   Black, Lesle Chris, NP  Cholecalciferol (VITAMIN D3) 25 MCG (1000 UT) CAPS Take 4,000 Units by mouth daily.    [provider]  cinacalcet (SENSIPAR) 30 MG tablet Take 2 tablets (60 mg total) by mouth 3 (three) times a week. 11/29/21   Angiulli, Mcarthur Rossetti, PA-C  diphenhydrAMINE (BENADRYL) 25 mg  capsule Take 25 mg by mouth every 6 (six) hours as needed for allergies.    [provider]  doxazosin (CARDURA) 8 MG tablet Take 1 tablet (8 mg total) by mouth daily. 04/24/22   Alver Sorrow, NP  doxycycline (VIBRA-TABS) 100 MG tablet Take 1 tablet (100 mg total) by mouth 2 (two) times daily. 09/18/22   Glenford Bayley, NP  ethyl chloride spray SMARTSIG:1 Spray(s) Topical 3 Times a Week 07/18/22   [provider]  FOSRENOL 1000 MG PACK Take 1 packet by mouth 3 (three) times daily. 02/26/22   [provider]  guaiFENesin-dextromethorphan (ROBITUSSIN DM) 100-10 MG/5ML syrup Take 5 mLs by mouth every 4 (four) hours as needed for cough. 09/18/22   Glenford Bayley, NP  hydrALAZINE (APRESOLINE) 100 MG tablet Take 1 tablet (100 mg total) by mouth 3 (three) times daily. 05/22/22   Ngetich, Dinah C, NP  isosorbide mononitrate (IMDUR) 120 MG 24 hr tablet Take 1 tablet (120 mg total) by mouth daily. 06/28/22   Alver Sorrow, NP  methocarbamol (ROBAXIN) 750 MG tablet Take 1 tablet (750 mg total) by mouth 4 (four) times daily. 10/02/22   Ngetich, Dinah C, NP  metoprolol tartrate (LOPRESSOR) 100 MG tablet TAKE 1 TABLET BY MOUTH TWICE A DAY 09/05/22   Ngetich, Dinah C, NP  multivitamin (RENA-VIT) TABS tablet Take 1 tablet by mouth daily. 11/29/21   Angiulli, Mcarthur Rossetti,  PA-C  ondansetron (ZOFRAN-ODT) 4 MG disintegrating tablet TAKE 1 TABLET BY MOUTH EVERY 8 HOURS AS NEEDED FOR NAUSEA AND VOMITING 10/02/22   Ngetich, Dinah C, NP  pantoprazole (PROTONIX) 40 MG tablet Take 1 tablet (40 mg total) by mouth 2 (two) times daily before a meal. 10/02/22   Ngetich, Dinah C, NP  pravastatin (PRAVACHOL) 40 MG tablet TAKE 1 TABLET BY MOUTH EVERY DAY IN THE EVENING 10/19/21   Ngetich, Dinah C, NP  predniSONE (DELTASONE) 10 MG tablet 4 tabs for 2 days, then 3 tabs for 2 days, 2 tabs for 2 days, then 1 tab for 2 days, then stop 10/23/22   Glenford Bayley, NP  sevelamer carbonate (RENVELA) 800 MG tablet  Take 2 tablets (1,600 mg total) by mouth as needed (snacks). 11/29/21   Angiulli, Mcarthur Rossetti, PA-C  sucralfate (CARAFATE) 1 GM/10ML suspension Take 10 mLs (1 g total) by mouth 4 (four) times daily -  with meals and at bedtime. 11/29/21   Angiulli, Mcarthur Rossetti, PA-C  traZODone (DESYREL) 50 MG tablet Take 0.5-1 tablets (25-50 mg total) by mouth at bedtime as needed. for sleep 05/22/22   Ngetich, Dinah C, NP  vortioxetine HBr (TRINTELLIX) 5 MG TABS tablet Take 1 tablet (5 mg total) by mouth daily. 09/12/22 11/11/22  Princess Bruins, DO      Allergies    Codeine and Hydrocodone-acetaminophen    Review of Systems   Review of Systems  Physical Exam Updated Vital Signs BP (!) 166/79   Pulse 84   Temp 98.1 F (36.7 C)   Resp 19   Ht 5\' 7"  (1.702 m)   Wt 91.6 kg   SpO2 100%   BMI 31.64 kg/m  Physical Exam Vitals and nursing note reviewed.  Constitutional:      General: He is not in acute distress.    Appearance: He is well-developed.  HENT:     Head: Normocephalic and atraumatic.     Mouth/Throat:     Mouth: Mucous membranes are moist.  Eyes:     General: Vision grossly intact. Gaze aligned appropriately.     Extraocular Movements: Extraocular movements intact.     Conjunctiva/sclera: Conjunctivae normal.  Cardiovascular:     Rate and Rhythm: Normal rate and regular rhythm.     Pulses: Normal pulses.     Heart sounds: Normal heart sounds, S1 normal and S2 normal. No murmur heard.    No friction rub. No gallop.  Pulmonary:     Effort: Pulmonary effort is normal. Tachypnea present. No respiratory distress.     Breath sounds: Decreased air movement present. Wheezing present.  Abdominal:     Palpations: Abdomen is soft.     Tenderness: There is no abdominal tenderness. There is no guarding or rebound.     Hernia: No hernia is present.  Musculoskeletal:        General: No swelling.     Cervical back: Full passive range of motion without pain, normal range of motion and neck supple. No pain  with movement, spinous process tenderness or muscular tenderness. Normal range of motion.     Right lower leg: No edema.     Left lower leg: No edema.  Skin:    General: Skin is warm and dry.     Capillary Refill: Capillary refill takes less than 2 seconds.     Findings: No ecchymosis, erythema, lesion or wound.  Neurological:     Mental Status: He is alert and oriented to person, place,  and time.     GCS: GCS eye subscore is 4. GCS verbal subscore is 5. GCS motor subscore is 6.     Cranial Nerves: Cranial nerves 2-12 are intact.     Sensory: Sensation is intact.     Motor: Motor function is intact. No weakness or abnormal muscle tone.     Coordination: Coordination is intact.  Psychiatric:        Mood and Affect: Mood normal.        Speech: Speech normal.        Behavior: Behavior normal.     ED Results / Procedures / Treatments   Labs (all labs ordered are listed, but only abnormal results are displayed) Labs Reviewed  CBC WITH DIFFERENTIAL/PLATELET - Abnormal; Notable for the following components:      Result Value   WBC 3.4 (*)    RBC 3.49 (*)    Hemoglobin 11.1 (*)    HCT 34.7 (*)    Platelets 81 (*)    Lymphs Abs 0.4 (*)    All other components within normal limits  BASIC METABOLIC PANEL - Abnormal; Notable for the following components:   Potassium 5.3 (*)    Chloride 97 (*)    Glucose, Bld 156 (*)    BUN 28 (*)    Creatinine, Ser 8.30 (*)    GFR, Estimated 7 (*)    All other components within normal limits    EKG EKG Interpretation Date/Time:  Thursday October 25 2022 05:05:48 EDT Ventricular Rate:  85 PR Interval:  182 QRS Duration:  80 QT Interval:  376 QTC Calculation: 447 R Axis:   70  Text Interpretation: Normal sinus rhythm Normal ECG Confirmed by Gilda Crease [phone]) on 10/25/2022 5:12:10 AM  Radiology DG Chest Port 1 View  Result Date: 10/25/2022 CLINICAL DATA:  Cough and shortness of breath EXAM: PORTABLE CHEST 1 VIEW COMPARISON:   07/30/2022 FINDINGS: Reticulation in the lower lungs symmetrically which correlates with chronic lung disease by prior CT. There is no edema, consolidation, effusion, or pneumothorax. Normal heart size and mediastinal contours for technique. IMPRESSION: Chronic lung disease when correlated with prior CT. No acute superimposed finding. Electronically Signed   By: Tiburcio Pea M.D.   On: 10/25/2022 06:30    Procedures Procedures    Medications Ordered in ED Medications  sodium zirconium cyclosilicate (LOKELMA) packet 10 g (has no administration in time range)  albuterol (VENTOLIN HFA) 108 (90 Base) MCG/ACT inhaler 2 puff (has no administration in time range)  AeroChamber Plus Flo-Vu Large MISC 1 each (has no administration in time range)  ipratropium-albuterol (DUONEB) 0.5-2.5 (3) MG/3ML nebulizer solution 3 mL (3 mLs Nebulization Given 10/25/22 0551)  albuterol (PROVENTIL) (2.5 MG/3ML) 0.083% nebulizer solution 2.5 mg (2.5 mg Nebulization Given 10/25/22 0601)    ED Course/ Medical Decision Making/ A&P                                 Medical Decision Making Amount and/or Complexity of Data Reviewed Labs: ordered. Radiology: ordered.  Risk Prescription drug management.   Patient with tachypnea and wheezing at arrival.  Presentation consistent with acute bronchospasm.  Patient reports significant improvement after albuterol 5 mg, Atrovent 0.5 mg nebulizer.  Airway movement is improved and he is no longer breathing fast.  Oxygen saturation 100%, blood pressure and heart rate improved.  Chest x-ray does not show any evidence of pneumonia.  Agree with  the prednisone that he is already on.  He is using Symbicort but does not have any albuterol.  He reports that he used to use nebulizer machine but when he moved it was lost.  Will provide albuterol with chamber, have him contact his pulmonologist for possible nebulizer at home.  Patient's potassium slightly elevated at 5.3.  He is due for  dialysis tomorrow.  No volume overload or need for emergent dialysis.  Will give a dose of Lokelma prior to discharge.  He understands that he needs to get dialysis tomorrow.        Final Clinical Impression(s) / ED Diagnoses Final diagnoses:  Bronchospasm  Hyperkalemia    Rx / DC Orders ED Discharge Orders     None         Gilda Crease, MD 10/25/22 [phone]

## 2022-10-26 DIAGNOSIS — N186 End stage renal disease: Secondary | ICD-10-CM | POA: Diagnosis not present

## 2022-10-26 DIAGNOSIS — D631 Anemia in chronic kidney disease: Secondary | ICD-10-CM | POA: Diagnosis not present

## 2022-10-26 DIAGNOSIS — Z992 Dependence on renal dialysis: Secondary | ICD-10-CM | POA: Diagnosis not present

## 2022-10-26 DIAGNOSIS — Z23 Encounter for immunization: Secondary | ICD-10-CM | POA: Diagnosis not present

## 2022-10-26 DIAGNOSIS — E8770 Fluid overload, unspecified: Secondary | ICD-10-CM | POA: Diagnosis not present

## 2022-10-26 DIAGNOSIS — N2581 Secondary hyperparathyroidism of renal origin: Secondary | ICD-10-CM | POA: Diagnosis not present

## 2022-10-29 ENCOUNTER — Telehealth: Payer: Self-pay | Admitting: Primary Care

## 2022-10-29 ENCOUNTER — Other Ambulatory Visit: Payer: Self-pay | Admitting: Internal Medicine

## 2022-10-29 DIAGNOSIS — D631 Anemia in chronic kidney disease: Secondary | ICD-10-CM | POA: Diagnosis not present

## 2022-10-29 DIAGNOSIS — Z23 Encounter for immunization: Secondary | ICD-10-CM | POA: Diagnosis not present

## 2022-10-29 DIAGNOSIS — N186 End stage renal disease: Secondary | ICD-10-CM | POA: Diagnosis not present

## 2022-10-29 DIAGNOSIS — N2581 Secondary hyperparathyroidism of renal origin: Secondary | ICD-10-CM | POA: Diagnosis not present

## 2022-10-29 DIAGNOSIS — E8770 Fluid overload, unspecified: Secondary | ICD-10-CM | POA: Diagnosis not present

## 2022-10-29 DIAGNOSIS — Z992 Dependence on renal dialysis: Secondary | ICD-10-CM | POA: Diagnosis not present

## 2022-10-29 MED ORDER — ALBUTEROL SULFATE HFA 108 (90 BASE) MCG/ACT IN AERS: 2.0000 | INHALATION_SPRAY | Freq: Four times a day (QID) | RESPIRATORY_TRACT | 6 refills | Status: DC | PRN

## 2022-10-29 MED ORDER — BUDESONIDE-FORMOTEROL FUMARATE 160-4.5 MCG/ACT IN AERO: 2.0000 | INHALATION_SPRAY | RESPIRATORY_TRACT | 5 refills | Status: DC | PRN

## 2022-10-29 NOTE — Telephone Encounter (Signed)
[facility] [address] is their pharmacy.  Wife calling about her husband. He presented to the ER this past week and here are some of those notes:  Chest x-ray does not show any evidence of pneumonia. Agree with the prednisone that he is already on. He is using Symbicort but does not have any albuterol. He reports that he used to use nebulizer machine but when he moved it was lost. Will provide albuterol with chamber, have him contact his pulmonologist for possible nebulizer at home.   They will discuss the nebulizer at the next appt but wife states they need Albuterol and Symbicort refills called in.

## 2022-10-29 NOTE — Telephone Encounter (Signed)
Refills has been sent. Nfn pt has been notified.

## 2022-10-31 DIAGNOSIS — Z992 Dependence on renal dialysis: Secondary | ICD-10-CM | POA: Diagnosis not present

## 2022-10-31 DIAGNOSIS — Z23 Encounter for immunization: Secondary | ICD-10-CM | POA: Diagnosis not present

## 2022-10-31 DIAGNOSIS — N2581 Secondary hyperparathyroidism of renal origin: Secondary | ICD-10-CM | POA: Diagnosis not present

## 2022-10-31 DIAGNOSIS — N186 End stage renal disease: Secondary | ICD-10-CM | POA: Diagnosis not present

## 2022-10-31 DIAGNOSIS — E8770 Fluid overload, unspecified: Secondary | ICD-10-CM | POA: Diagnosis not present

## 2022-10-31 DIAGNOSIS — D631 Anemia in chronic kidney disease: Secondary | ICD-10-CM | POA: Diagnosis not present

## 2022-10-31 NOTE — Telephone Encounter (Signed)
Pharmacy comment: Alternative Requested:THE PRESCRIBED MEDICATION IS NOT COVERED BY INSURANCE. PLEASE CONSIDER CHANGING TO ONE OF THE SUGGESTED COVERED ALTERNATIVES.   All Pharmacy Suggested Alternatives:  fluticasone-salmeterol (ADVAIR HFA) 45-21 MCG/ACT inhaler Mometasone Furo-Formoterol Fum (DULERA) 50-5 MCG/ACT AERO

## 2022-11-01 MED ORDER — FLUTICASONE-SALMETEROL 115-21 MCG/ACT IN AERO: 2.0000 | INHALATION_SPRAY | Freq: Two times a day (BID) | RESPIRATORY_TRACT | 12 refills | Status: DC

## 2022-11-02 DIAGNOSIS — N2581 Secondary hyperparathyroidism of renal origin: Secondary | ICD-10-CM | POA: Diagnosis not present

## 2022-11-02 DIAGNOSIS — D631 Anemia in chronic kidney disease: Secondary | ICD-10-CM | POA: Diagnosis not present

## 2022-11-02 DIAGNOSIS — E8770 Fluid overload, unspecified: Secondary | ICD-10-CM | POA: Diagnosis not present

## 2022-11-02 DIAGNOSIS — Z23 Encounter for immunization: Secondary | ICD-10-CM | POA: Diagnosis not present

## 2022-11-02 DIAGNOSIS — N186 End stage renal disease: Secondary | ICD-10-CM | POA: Diagnosis not present

## 2022-11-02 DIAGNOSIS — Z992 Dependence on renal dialysis: Secondary | ICD-10-CM | POA: Diagnosis not present

## 2022-11-05 DIAGNOSIS — Z23 Encounter for immunization: Secondary | ICD-10-CM | POA: Diagnosis not present

## 2022-11-05 DIAGNOSIS — E8770 Fluid overload, unspecified: Secondary | ICD-10-CM | POA: Diagnosis not present

## 2022-11-05 DIAGNOSIS — D631 Anemia in chronic kidney disease: Secondary | ICD-10-CM | POA: Diagnosis not present

## 2022-11-05 DIAGNOSIS — Z992 Dependence on renal dialysis: Secondary | ICD-10-CM | POA: Diagnosis not present

## 2022-11-05 DIAGNOSIS — N186 End stage renal disease: Secondary | ICD-10-CM | POA: Diagnosis not present

## 2022-11-05 DIAGNOSIS — N2581 Secondary hyperparathyroidism of renal origin: Secondary | ICD-10-CM | POA: Diagnosis not present

## 2022-11-06 DIAGNOSIS — N186 End stage renal disease: Secondary | ICD-10-CM | POA: Diagnosis not present

## 2022-11-06 DIAGNOSIS — N2581 Secondary hyperparathyroidism of renal origin: Secondary | ICD-10-CM | POA: Diagnosis not present

## 2022-11-06 DIAGNOSIS — Z23 Encounter for immunization: Secondary | ICD-10-CM | POA: Diagnosis not present

## 2022-11-06 DIAGNOSIS — E8770 Fluid overload, unspecified: Secondary | ICD-10-CM | POA: Diagnosis not present

## 2022-11-06 DIAGNOSIS — D631 Anemia in chronic kidney disease: Secondary | ICD-10-CM | POA: Diagnosis not present

## 2022-11-06 DIAGNOSIS — Z992 Dependence on renal dialysis: Secondary | ICD-10-CM | POA: Diagnosis not present

## 2022-11-07 DIAGNOSIS — N2581 Secondary hyperparathyroidism of renal origin: Secondary | ICD-10-CM | POA: Diagnosis not present

## 2022-11-07 DIAGNOSIS — Z992 Dependence on renal dialysis: Secondary | ICD-10-CM | POA: Diagnosis not present

## 2022-11-07 DIAGNOSIS — D631 Anemia in chronic kidney disease: Secondary | ICD-10-CM | POA: Diagnosis not present

## 2022-11-07 DIAGNOSIS — E8770 Fluid overload, unspecified: Secondary | ICD-10-CM | POA: Diagnosis not present

## 2022-11-07 DIAGNOSIS — Z23 Encounter for immunization: Secondary | ICD-10-CM | POA: Diagnosis not present

## 2022-11-07 DIAGNOSIS — N186 End stage renal disease: Secondary | ICD-10-CM | POA: Diagnosis not present

## 2022-11-09 DIAGNOSIS — Z992 Dependence on renal dialysis: Secondary | ICD-10-CM | POA: Diagnosis not present

## 2022-11-09 DIAGNOSIS — N2581 Secondary hyperparathyroidism of renal origin: Secondary | ICD-10-CM | POA: Diagnosis not present

## 2022-11-09 DIAGNOSIS — E8770 Fluid overload, unspecified: Secondary | ICD-10-CM | POA: Diagnosis not present

## 2022-11-09 DIAGNOSIS — Z23 Encounter for immunization: Secondary | ICD-10-CM | POA: Diagnosis not present

## 2022-11-09 DIAGNOSIS — D631 Anemia in chronic kidney disease: Secondary | ICD-10-CM | POA: Diagnosis not present

## 2022-11-09 DIAGNOSIS — N186 End stage renal disease: Secondary | ICD-10-CM | POA: Diagnosis not present

## 2022-11-12 ENCOUNTER — Ambulatory Visit (HOSPITAL_COMMUNITY): Payer: Medicare Other | Admitting: Student

## 2022-11-12 DIAGNOSIS — N186 End stage renal disease: Secondary | ICD-10-CM | POA: Diagnosis not present

## 2022-11-12 DIAGNOSIS — N2581 Secondary hyperparathyroidism of renal origin: Secondary | ICD-10-CM | POA: Diagnosis not present

## 2022-11-12 DIAGNOSIS — Z992 Dependence on renal dialysis: Secondary | ICD-10-CM | POA: Diagnosis not present

## 2022-11-12 DIAGNOSIS — Z23 Encounter for immunization: Secondary | ICD-10-CM | POA: Diagnosis not present

## 2022-11-12 DIAGNOSIS — E8770 Fluid overload, unspecified: Secondary | ICD-10-CM | POA: Diagnosis not present

## 2022-11-12 DIAGNOSIS — D631 Anemia in chronic kidney disease: Secondary | ICD-10-CM | POA: Diagnosis not present

## 2022-11-13 ENCOUNTER — Ambulatory Visit (INDEPENDENT_AMBULATORY_CARE_PROVIDER_SITE_OTHER): Payer: Medicare Other | Admitting: Primary Care

## 2022-11-13 ENCOUNTER — Encounter: Payer: Self-pay | Admitting: Primary Care

## 2022-11-13 VITALS — BP 160/70 | HR 63 | Ht 67.0 in | Wt 208.4 lb

## 2022-11-13 DIAGNOSIS — J42 Unspecified chronic bronchitis: Secondary | ICD-10-CM

## 2022-11-13 DIAGNOSIS — J209 Acute bronchitis, unspecified: Secondary | ICD-10-CM

## 2022-11-13 MED ORDER — ALBUTEROL SULFATE (2.5 MG/3ML) 0.083% IN NEBU: 2.5000 mg | INHALATION_SOLUTION | Freq: Four times a day (QID) | RESPIRATORY_TRACT | 1 refills | Status: DC | PRN

## 2022-11-13 NOTE — Progress Notes (Signed)
@Patient  ID: Kirk [name], male    DOB: December 05, 1960, 62 y.o.   MRN: [id number]  Chief Complaint  Patient presents with   Follow-up    Referring provider: Caesar Bookman, NP  HPI:  62 year old male, Former smoker quit in April 2022 (16-pack-year history).  Past medical history significant for mild persistent asthma, systolic heart failure, hypertension, OSA on CPAP, type 2 diabetes, end-stage renal disease on dialysis.  Patient of Dr. Sherene Sires.  Maintained on Symbicort 160, as needed albuterol HFA or DuoNeb.  Spirometry in February 2023 showed moderate airway obstruction/FEV 1 42%.   09/18/2022 Patient presents today for overdue follow-up asthma. Patient follows with Dr. Sherene Sires. Reports increased shortness of breath symptoms with exertion and cough since having rhinovirus and RSV in April 2024. These symptoms are not new. He gets winded when walking. He ran out of symbicort last month. Symptoms were previously well controlled. He has associated sinus congestion/PND. Cough is productive with clear sputum is clear. He has not tried any nasal sprays. CT chest in April 2024 showed mucus plugging, mild consolidations and numerous small pulmonary nodules most prominent in lower lungs, slightly worse when compared to prior exam.  Findings felt to be likely due to infection or aspiration. CXR in July without acute cardiopulmonary process.  10/23/2022 Patient was treated for an episode of bronchitis back in August with Doxycycline and Robitussin DM. Accompanied by his wife/ daughter. He reports that his cough is no better. He has a persistent daily cough daily which is occasionally productive. Dyspnea is prominently with exertion. Associated wheezing. He is maintained on Symbicort 160. CXR in July was normal   Recommendations: Take mucinex 600-1200mg  twice daily x 5-7 days  (not with robitussin)   Orders: Depo-medrol 80mg  IM x1 CXR and labs    Rx: Prednisone taper    Follow-up: 2-4 weeks with Beth  NP    11/13/2022- Interim hx  Patient presents today for 2-4 week follow-up bronchitis. Seen on 10/23/22. Received depo-medrol 80mg  IM x1 and prednisone taper. CXR ordered but not completed. He was seen in ED on 10/25/22 for acute bronchospasm, tachypnea and wheezing noted.  Rceived albuterol 5mg  and atrovent nebulizer. Provided with aeroschamer, lost his nebulizer during move. CXR showed reticulation lower lungs which correlated with chronic lung disease seen with prior CT, no acute superimposed process.   CT chest April 2024 showed mucous plugging, mild consolidations with numerous small pulmonary nodules most pronounced in lower lungs. Findings likely due to infection or aspiration.   Allergies  Allergen Reactions   Codeine Nausea And Vomiting   Hydrocodone-Acetaminophen Nausea And Vomiting    Immunization History  Administered Date(s) Administered   Influenza Whole 11/01/2010   Influenza, Seasonal, Injecte, Preservative Fre 10/29/2012   Influenza-Unspecified 11/21/2011, 11/05/2012, 09/22/2013, 09/22/2017, 11/23/2019, 10/28/2020, 12/04/2020   Moderna Sars-Covid-2 Vaccination 04/06/2019, 05/07/2019, 03/31/2020   PPD Test 12/26/2012   Pneumococcal Polysaccharide-23 11/01/2010, 10/29/2012   Pneumococcal-Unspecified 11/05/2012   Tdap 01/22/2006, 06/08/2021    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)    Anxiety    Asthma    Asthma    moderate persistent   Carpal tunnel syndrome    Cellulitis and abscess of trunk    Cholelithiasis 07/13/2014   Chronic headaches    Cigarette smoker 07/11/2017   Stopped 2/022  - referred to  start Lung cancer screening 04/2021         Debility, unspecified    Dermatophytosis of the body    Dysrhythmia    history of   Edema    End stage renal disease on dialysis [facility])    "MWF; [facility]" (10/21/2014)   Essential hypertension, benign    Generalized anxiety disorder 05/25/2022   GERD (gastroesophageal reflux disease)    Gout, unspecified    HTN (hypertension)    Hypertrophy of prostate without urinary obstruction and other lower urinary tract symptoms (LUTS)    Hypotension, unspecified    Impotence of organic origin    Insomnia, unspecified    Kidney replaced by transplant    Localization-related (focal) (partial) epilepsy and epileptic syndromes with complex partial seizures, without mention of intractable epilepsy    12-15-19- Wife states he has NEVER had a seizure    Lumbago    Memory loss    OSA on CPAP    Other and unspecified hyperlipidemia    controlled /managed per wife    Other chronic nonalcoholic liver disease    Other malaise and fatigue    Other nonspecific abnormal serum enzyme levels    Pain in joint, lower leg    Pain in joint, upper arm    Pneumonia "several times"   PONV (postoperative nausea and vomiting)    Renal dialysis status(V45.11) 02/05/2010   restarted 01/02/13 ofter renal trransplant failure   Secondary hyperparathyroidism (of renal origin)    Shortness of breath    Sleep apnea    wears cpap    Tension headache    Unspecified constipation    Unspecified essential hypertension    Unspecified hereditary and idiopathic peripheral neuropathy    Unspecified vitamin D deficiency     Tobacco History: Social History   Tobacco Use  Smoking Status Former   Current packs/day: 0.00   Average packs/day: 0.5 packs/day for 32.0 years (16.0 ttl pk-yrs)   Types: Cigarettes   Start date: 36   Quit date: 02/24/2020   Years since quitting: 2.7  Smokeless Tobacco Never   Counseling given: Not Answered   Outpatient Medications Prior to Visit  Medication Sig Dispense Refill   acetaminophen (TYLENOL) 500 MG tablet Take 1 tablet (500 mg total) by mouth every 6 (six) hours as needed for moderate pain.     albuterol (VENTOLIN  HFA) 108 (90 Base) MCG/ACT inhaler Inhale 2 puffs into the lungs every 6 (six) hours as needed for wheezing or shortness of breath. 8 g 6   amLODipine (NORVASC) 10 MG tablet Take 1 tablet (10 mg total) by mouth every evening.     aspirin 81 MG chewable tablet Chew 81 mg by mouth daily.     Azelastine HCl 137 MCG/SPRAY SOLN PLACE 1 SPRAY INTO BOTH NOSTRILS 2 (TWO) TIMES DAILY. USE IN EACH NOSTRIL AS DIRECTED 90 mL 0   calcitRIOL (ROCALTROL) 0.5 MCG capsule Take 2 capsules (1 mcg total) by mouth every Monday, Wednesday, and Friday with hemodialysis. 60 capsule 1   Cholecalciferol (VITAMIN D3) 25 MCG (1000 UT) CAPS Take 4,000 Units by mouth daily.     cinacalcet (SENSIPAR) 30 MG tablet Take 2 tablets (60 mg total) by mouth 3 (three) times a week. 60 tablet    diphenhydrAMINE (BENADRYL) 25 mg capsule Take 25 mg by mouth every 6 (six) hours as needed for allergies.     doxazosin (CARDURA) 8 MG tablet Take 1 tablet (8 mg total) by  mouth daily. 90 tablet 3   ethyl chloride spray SMARTSIG:1 Spray(s) Topical 3 Times a Week     fluticasone-salmeterol (ADVAIR HFA) 115-21 MCG/ACT inhaler Inhale 2 puffs into the lungs 2 (two) times daily. 1 each 12   FOSRENOL 1000 MG PACK Take 1 packet by mouth 3 (three) times daily.     guaiFENesin-dextromethorphan (ROBITUSSIN DM) 100-10 MG/5ML syrup Take 5 mLs by mouth every 4 (four) hours as needed for cough. 118 mL 0   hydrALAZINE (APRESOLINE) 100 MG tablet Take 1 tablet (100 mg total) by mouth 3 (three) times daily. 270 tablet 3   isosorbide mononitrate (IMDUR) 120 MG 24 hr tablet Take 1 tablet (120 mg total) by mouth daily. 90 tablet 3   methocarbamol (ROBAXIN) 750 MG tablet Take 1 tablet (750 mg total) by mouth 4 (four) times daily. 90 tablet 3   metoprolol tartrate (LOPRESSOR) 100 MG tablet TAKE 1 TABLET BY MOUTH TWICE A DAY 180 tablet 1   multivitamin (RENA-VIT) TABS tablet Take 1 tablet by mouth daily.  0   ondansetron (ZOFRAN-ODT) 4 MG disintegrating tablet TAKE 1  TABLET BY MOUTH EVERY 8 HOURS AS NEEDED FOR NAUSEA AND VOMITING 30 tablet 3   pantoprazole (PROTONIX) 40 MG tablet Take 1 tablet (40 mg total) by mouth 2 (two) times daily before a meal. 60 tablet 3   pravastatin (PRAVACHOL) 40 MG tablet TAKE 1 TABLET BY MOUTH EVERY DAY IN THE EVENING 90 tablet 2   sevelamer carbonate (RENVELA) 800 MG tablet Take 2 tablets (1,600 mg total) by mouth as needed (snacks).     sucralfate (CARAFATE) 1 GM/10ML suspension Take 10 mLs (1 g total) by mouth 4 (four) times daily -  with meals and at bedtime. 420 mL 0   traZODone (DESYREL) 50 MG tablet Take 0.5-1 tablets (25-50 mg total) by mouth at bedtime as needed. for sleep 90 tablet 1   doxycycline (VIBRA-TABS) 100 MG tablet Take 1 tablet (100 mg total) by mouth 2 (two) times daily. 20 tablet 0   predniSONE (DELTASONE) 10 MG tablet 4 tabs for 2 days, then 3 tabs for 2 days, 2 tabs for 2 days, then 1 tab for 2 days, then stop 20 tablet 0   No facility-administered medications prior to visit.   Review of Systems  Review of Systems  Constitutional: Negative.   HENT:  Positive for congestion.   Respiratory:  Positive for cough.   Cardiovascular: Negative.    Physical Exam  BP (!) 160/70 (BP Location: Right Arm, Cuff Size: Large)   Pulse 63   Ht 5\' 7"  (1.702 m)   Wt 208 lb 6.4 oz (94.5 kg)   SpO2 97%   BMI 32.64 kg/m  Physical Exam Constitutional:      Appearance: Normal appearance.  Pulmonary:     Effort: Pulmonary effort is normal.     Breath sounds: Wheezing present.     Comments: Chronic cough  Neurological:     Mental Status: He is alert.      Lab Results:  CBC    Component Value Date/Time   WBC 3.4 (L) 10/25/2022 0606   RBC 3.49 (L) 10/25/2022 0606   HGB 11.1 (L) 10/25/2022 0606   HGB 12.2 (L) 07/24/2022 1432   HCT 34.7 (L) 10/25/2022 0606   PLT 81 (L) 10/25/2022 0606   PLT 114 (L) 07/24/2022 1432   MCV 99.4 10/25/2022 0606   MCH 31.8 10/25/2022 0606   MCHC 32.0 10/25/2022 0606   RDW  15.1  10/25/2022 0606   RDW 16.7 (H) 11/26/2013 0914   LYMPHSABS 0.4 (L) 10/25/2022 0606   LYMPHSABS 1.3 11/26/2013 0914   MONOABS 0.1 10/25/2022 0606   EOSABS 0.0 10/25/2022 0606   EOSABS 0.2 11/26/2013 0914   BASOSABS 0.0 10/25/2022 0606   BASOSABS 0.1 11/26/2013 0914    BMET    Component Value Date/Time   NA 138 10/25/2022 0606   NA 136 11/26/2013 0914   K 5.3 (H) 10/25/2022 0606   CL 97 (L) 10/25/2022 0606   CO2 27 10/25/2022 0606   GLUCOSE 156 (H) 10/25/2022 0606   BUN 28 (H) 10/25/2022 0606   BUN 66 (A) 05/25/2015 0000   BUN 66 (A) 05/25/2015 0000   CREATININE 8.30 (H) 10/25/2022 0606   CREATININE 9.87 (HH) 07/24/2022 1432   CREATININE 8.37 (H) 02/03/2018 1523   CALCIUM 9.2 10/25/2022 0606   CALCIUM 8.8 06/08/2015 0000   CALCIUM 7.9 (L) 01/19/2010 1016   GFRNONAA 7 (L) 10/25/2022 0606   GFRNONAA 6 (L) 07/24/2022 1432   GFRAA 4 (L) 07/15/2019 0704    BNP    Component Value Date/Time   BNP 1,139.8 (H) 12/02/2018 2031    ProBNP    Component Value Date/Time   PROBNP 725.4 (H) 08/04/2011 0520    Imaging: No results found.   Assessment & Plan:   Chronic bronchitis with acute exacerbation (HCC) - Chronic daily cough with some mucus production. Associated exertional dyspnea and wheezing  - Patient had evidence of mucus plugging on CT chest in [place] 2024. CXR in July was normal.  - Trial Breztri Aerosphere in place of Symbicort - Start Albuterol nebulizer twice daily followed by flutter valve and cough/deep breathing exercises  - Continue Mucinex 600mg  twice daily - DME order for nebulizer machine  Recommendations: Use nebulizer twice a day, followed by flutter valve and coughing exercises  Continue mucinex 600mg  twice daily Trial Breztri Aerosphere two puffs twice daily Stop Symbicort  Orders: Nebulizer machine re: chronic bronchitis   Follow-up: 8 weeks with Beth NP    Glenford Bayley, NP 12/02/2022

## 2022-11-13 NOTE — Patient Instructions (Addendum)
Recommendations: Use nebulizer twice a day, followed by flutter valve and coughing exercises  Continue mucinex 600mg  twice daily Trial Breztri Aerosphere two puffs twice daily Stop Symbicort  Orders: Nebulizer machine re: chronic bronchitis   Follow-up: 8 weeks with Waynetta Sandy NP

## 2022-11-14 DIAGNOSIS — N2581 Secondary hyperparathyroidism of renal origin: Secondary | ICD-10-CM | POA: Diagnosis not present

## 2022-11-14 DIAGNOSIS — N186 End stage renal disease: Secondary | ICD-10-CM | POA: Diagnosis not present

## 2022-11-14 DIAGNOSIS — E8770 Fluid overload, unspecified: Secondary | ICD-10-CM | POA: Diagnosis not present

## 2022-11-14 DIAGNOSIS — D631 Anemia in chronic kidney disease: Secondary | ICD-10-CM | POA: Diagnosis not present

## 2022-11-14 DIAGNOSIS — Z992 Dependence on renal dialysis: Secondary | ICD-10-CM | POA: Diagnosis not present

## 2022-11-14 DIAGNOSIS — Z23 Encounter for immunization: Secondary | ICD-10-CM | POA: Diagnosis not present

## 2022-11-16 DIAGNOSIS — N186 End stage renal disease: Secondary | ICD-10-CM | POA: Diagnosis not present

## 2022-11-16 DIAGNOSIS — Z992 Dependence on renal dialysis: Secondary | ICD-10-CM | POA: Diagnosis not present

## 2022-11-16 DIAGNOSIS — N2581 Secondary hyperparathyroidism of renal origin: Secondary | ICD-10-CM | POA: Diagnosis not present

## 2022-11-16 DIAGNOSIS — D631 Anemia in chronic kidney disease: Secondary | ICD-10-CM | POA: Diagnosis not present

## 2022-11-16 DIAGNOSIS — Z23 Encounter for immunization: Secondary | ICD-10-CM | POA: Diagnosis not present

## 2022-11-16 DIAGNOSIS — E8770 Fluid overload, unspecified: Secondary | ICD-10-CM | POA: Diagnosis not present

## 2022-11-19 DIAGNOSIS — Z23 Encounter for immunization: Secondary | ICD-10-CM | POA: Diagnosis not present

## 2022-11-19 DIAGNOSIS — N186 End stage renal disease: Secondary | ICD-10-CM | POA: Diagnosis not present

## 2022-11-19 DIAGNOSIS — D631 Anemia in chronic kidney disease: Secondary | ICD-10-CM | POA: Diagnosis not present

## 2022-11-19 DIAGNOSIS — Z992 Dependence on renal dialysis: Secondary | ICD-10-CM | POA: Diagnosis not present

## 2022-11-19 DIAGNOSIS — E8770 Fluid overload, unspecified: Secondary | ICD-10-CM | POA: Diagnosis not present

## 2022-11-19 DIAGNOSIS — N2581 Secondary hyperparathyroidism of renal origin: Secondary | ICD-10-CM | POA: Diagnosis not present

## 2022-11-21 DIAGNOSIS — D631 Anemia in chronic kidney disease: Secondary | ICD-10-CM | POA: Diagnosis not present

## 2022-11-21 DIAGNOSIS — Z992 Dependence on renal dialysis: Secondary | ICD-10-CM | POA: Diagnosis not present

## 2022-11-21 DIAGNOSIS — N186 End stage renal disease: Secondary | ICD-10-CM | POA: Diagnosis not present

## 2022-11-21 DIAGNOSIS — N2581 Secondary hyperparathyroidism of renal origin: Secondary | ICD-10-CM | POA: Diagnosis not present

## 2022-11-21 DIAGNOSIS — E8770 Fluid overload, unspecified: Secondary | ICD-10-CM | POA: Diagnosis not present

## 2022-11-21 DIAGNOSIS — Z23 Encounter for immunization: Secondary | ICD-10-CM | POA: Diagnosis not present

## 2022-11-23 DIAGNOSIS — Z23 Encounter for immunization: Secondary | ICD-10-CM | POA: Diagnosis not present

## 2022-11-23 DIAGNOSIS — D509 Iron deficiency anemia, unspecified: Secondary | ICD-10-CM | POA: Diagnosis not present

## 2022-11-23 DIAGNOSIS — N186 End stage renal disease: Secondary | ICD-10-CM | POA: Diagnosis not present

## 2022-11-23 DIAGNOSIS — D631 Anemia in chronic kidney disease: Secondary | ICD-10-CM | POA: Diagnosis not present

## 2022-11-23 DIAGNOSIS — Z992 Dependence on renal dialysis: Secondary | ICD-10-CM | POA: Diagnosis not present

## 2022-11-23 DIAGNOSIS — T8612 Kidney transplant failure: Secondary | ICD-10-CM | POA: Diagnosis not present

## 2022-11-23 DIAGNOSIS — N2581 Secondary hyperparathyroidism of renal origin: Secondary | ICD-10-CM | POA: Diagnosis not present

## 2022-11-24 DIAGNOSIS — Z992 Dependence on renal dialysis: Secondary | ICD-10-CM | POA: Diagnosis not present

## 2022-11-24 DIAGNOSIS — N186 End stage renal disease: Secondary | ICD-10-CM | POA: Diagnosis not present

## 2022-11-24 DIAGNOSIS — N2581 Secondary hyperparathyroidism of renal origin: Secondary | ICD-10-CM | POA: Diagnosis not present

## 2022-11-26 ENCOUNTER — Ambulatory Visit (HOSPITAL_COMMUNITY): Payer: Medicare Other | Admitting: Student

## 2022-11-26 DIAGNOSIS — N186 End stage renal disease: Secondary | ICD-10-CM | POA: Diagnosis not present

## 2022-11-26 DIAGNOSIS — Z23 Encounter for immunization: Secondary | ICD-10-CM | POA: Diagnosis not present

## 2022-11-26 DIAGNOSIS — N2581 Secondary hyperparathyroidism of renal origin: Secondary | ICD-10-CM | POA: Diagnosis not present

## 2022-11-26 DIAGNOSIS — Z992 Dependence on renal dialysis: Secondary | ICD-10-CM | POA: Diagnosis not present

## 2022-11-26 DIAGNOSIS — D631 Anemia in chronic kidney disease: Secondary | ICD-10-CM | POA: Diagnosis not present

## 2022-11-26 DIAGNOSIS — D509 Iron deficiency anemia, unspecified: Secondary | ICD-10-CM | POA: Diagnosis not present

## 2022-11-28 DIAGNOSIS — N186 End stage renal disease: Secondary | ICD-10-CM | POA: Diagnosis not present

## 2022-11-28 DIAGNOSIS — D509 Iron deficiency anemia, unspecified: Secondary | ICD-10-CM | POA: Diagnosis not present

## 2022-11-28 DIAGNOSIS — Z23 Encounter for immunization: Secondary | ICD-10-CM | POA: Diagnosis not present

## 2022-11-28 DIAGNOSIS — N2581 Secondary hyperparathyroidism of renal origin: Secondary | ICD-10-CM | POA: Diagnosis not present

## 2022-11-28 DIAGNOSIS — Z992 Dependence on renal dialysis: Secondary | ICD-10-CM | POA: Diagnosis not present

## 2022-11-28 DIAGNOSIS — D631 Anemia in chronic kidney disease: Secondary | ICD-10-CM | POA: Diagnosis not present

## 2022-11-30 DIAGNOSIS — Z992 Dependence on renal dialysis: Secondary | ICD-10-CM | POA: Diagnosis not present

## 2022-11-30 DIAGNOSIS — N2581 Secondary hyperparathyroidism of renal origin: Secondary | ICD-10-CM | POA: Diagnosis not present

## 2022-11-30 DIAGNOSIS — D631 Anemia in chronic kidney disease: Secondary | ICD-10-CM | POA: Diagnosis not present

## 2022-11-30 DIAGNOSIS — Z23 Encounter for immunization: Secondary | ICD-10-CM | POA: Diagnosis not present

## 2022-11-30 DIAGNOSIS — D509 Iron deficiency anemia, unspecified: Secondary | ICD-10-CM | POA: Diagnosis not present

## 2022-11-30 DIAGNOSIS — N186 End stage renal disease: Secondary | ICD-10-CM | POA: Diagnosis not present

## 2022-12-02 NOTE — Assessment & Plan Note (Addendum)
-   Chronic daily cough with some mucus production. Associated exertional dyspnea and wheezing  - Patient had evidence of mucus plugging on CT chest in [place] 2024. CXR in July was normal.  - Trial Breztri Aerosphere in place of Symbicort - Start Albuterol nebulizer twice daily followed by flutter valve and cough/deep breathing exercises  - Continue Mucinex 600mg  twice daily - DME order for nebulizer machine

## 2022-12-03 DIAGNOSIS — N2581 Secondary hyperparathyroidism of renal origin: Secondary | ICD-10-CM | POA: Diagnosis not present

## 2022-12-03 DIAGNOSIS — Z992 Dependence on renal dialysis: Secondary | ICD-10-CM | POA: Diagnosis not present

## 2022-12-03 DIAGNOSIS — Z23 Encounter for immunization: Secondary | ICD-10-CM | POA: Diagnosis not present

## 2022-12-03 DIAGNOSIS — D509 Iron deficiency anemia, unspecified: Secondary | ICD-10-CM | POA: Diagnosis not present

## 2022-12-03 DIAGNOSIS — N186 End stage renal disease: Secondary | ICD-10-CM | POA: Diagnosis not present

## 2022-12-03 DIAGNOSIS — D631 Anemia in chronic kidney disease: Secondary | ICD-10-CM | POA: Diagnosis not present

## 2022-12-04 ENCOUNTER — Other Ambulatory Visit: Payer: Self-pay | Admitting: Family

## 2022-12-04 DIAGNOSIS — E782 Mixed hyperlipidemia: Secondary | ICD-10-CM

## 2022-12-05 ENCOUNTER — Other Ambulatory Visit: Payer: Self-pay | Admitting: Family

## 2022-12-05 DIAGNOSIS — Z23 Encounter for immunization: Secondary | ICD-10-CM | POA: Diagnosis not present

## 2022-12-05 DIAGNOSIS — N186 End stage renal disease: Secondary | ICD-10-CM | POA: Diagnosis not present

## 2022-12-05 DIAGNOSIS — D631 Anemia in chronic kidney disease: Secondary | ICD-10-CM | POA: Diagnosis not present

## 2022-12-05 DIAGNOSIS — D509 Iron deficiency anemia, unspecified: Secondary | ICD-10-CM | POA: Diagnosis not present

## 2022-12-05 DIAGNOSIS — I120 Hypertensive chronic kidney disease with stage 5 chronic kidney disease or end stage renal disease: Secondary | ICD-10-CM

## 2022-12-05 DIAGNOSIS — N2581 Secondary hyperparathyroidism of renal origin: Secondary | ICD-10-CM | POA: Diagnosis not present

## 2022-12-05 DIAGNOSIS — Z992 Dependence on renal dialysis: Secondary | ICD-10-CM | POA: Diagnosis not present

## 2022-12-07 DIAGNOSIS — D509 Iron deficiency anemia, unspecified: Secondary | ICD-10-CM | POA: Diagnosis not present

## 2022-12-07 DIAGNOSIS — Z23 Encounter for immunization: Secondary | ICD-10-CM | POA: Diagnosis not present

## 2022-12-07 DIAGNOSIS — Z992 Dependence on renal dialysis: Secondary | ICD-10-CM | POA: Diagnosis not present

## 2022-12-07 DIAGNOSIS — D631 Anemia in chronic kidney disease: Secondary | ICD-10-CM | POA: Diagnosis not present

## 2022-12-07 DIAGNOSIS — N186 End stage renal disease: Secondary | ICD-10-CM | POA: Diagnosis not present

## 2022-12-07 DIAGNOSIS — N2581 Secondary hyperparathyroidism of renal origin: Secondary | ICD-10-CM | POA: Diagnosis not present

## 2022-12-10 DIAGNOSIS — D509 Iron deficiency anemia, unspecified: Secondary | ICD-10-CM | POA: Diagnosis not present

## 2022-12-10 DIAGNOSIS — D631 Anemia in chronic kidney disease: Secondary | ICD-10-CM | POA: Diagnosis not present

## 2022-12-10 DIAGNOSIS — N186 End stage renal disease: Secondary | ICD-10-CM | POA: Diagnosis not present

## 2022-12-10 DIAGNOSIS — Z992 Dependence on renal dialysis: Secondary | ICD-10-CM | POA: Diagnosis not present

## 2022-12-10 DIAGNOSIS — N2581 Secondary hyperparathyroidism of renal origin: Secondary | ICD-10-CM | POA: Diagnosis not present

## 2022-12-10 DIAGNOSIS — Z23 Encounter for immunization: Secondary | ICD-10-CM | POA: Diagnosis not present

## 2022-12-12 ENCOUNTER — Other Ambulatory Visit (HOSPITAL_COMMUNITY): Payer: Self-pay | Admitting: Nephrology

## 2022-12-12 DIAGNOSIS — N186 End stage renal disease: Secondary | ICD-10-CM

## 2022-12-13 ENCOUNTER — Other Ambulatory Visit (HOSPITAL_COMMUNITY): Payer: Self-pay | Admitting: Nephrology

## 2022-12-13 ENCOUNTER — Other Ambulatory Visit: Payer: Self-pay

## 2022-12-13 ENCOUNTER — Encounter (HOSPITAL_COMMUNITY): Payer: Self-pay

## 2022-12-13 ENCOUNTER — Ambulatory Visit (HOSPITAL_COMMUNITY)
Admission: RE | Admit: 2022-12-13 | Discharge: 2022-12-13 | Disposition: A | Discharge status: Home / Self Care | Payer: Medicare Other | Source: Ambulatory Visit | Attending: Nephrology | Admitting: Nephrology

## 2022-12-13 ENCOUNTER — Other Ambulatory Visit: Payer: Self-pay | Admitting: Radiology

## 2022-12-13 DIAGNOSIS — Z992 Dependence on renal dialysis: Secondary | ICD-10-CM | POA: Diagnosis not present

## 2022-12-13 DIAGNOSIS — N186 End stage renal disease: Secondary | ICD-10-CM | POA: Insufficient documentation

## 2022-12-13 DIAGNOSIS — I120 Hypertensive chronic kidney disease with stage 5 chronic kidney disease or end stage renal disease: Secondary | ICD-10-CM | POA: Insufficient documentation

## 2022-12-13 DIAGNOSIS — Y832 Surgical operation with anastomosis, bypass or graft as the cause of abnormal reaction of the patient, or of later complication, without mention of misadventure at the time of the procedure: Secondary | ICD-10-CM | POA: Insufficient documentation

## 2022-12-13 DIAGNOSIS — Z87891 Personal history of nicotine dependence: Secondary | ICD-10-CM | POA: Insufficient documentation

## 2022-12-13 DIAGNOSIS — T82858A Stenosis of vascular prosthetic devices, implants and grafts, initial encounter: Secondary | ICD-10-CM | POA: Insufficient documentation

## 2022-12-13 HISTORY — PX: IR AV DIALY SHUNT INTRO NEEDLE/INTRACATH INITIAL W/PTA/IMG LEFT: IMG6103

## 2022-12-13 HISTORY — PX: IR US GUIDE VASC ACCESS RIGHT: IMG2390

## 2022-12-13 HISTORY — PX: IR US GUIDE VASC ACCESS LEFT: IMG2389

## 2022-12-13 LAB — BASIC METABOLIC PANEL
Anion gap: 15 (ref 5–15)
BUN: 72 mg/dL — ABNORMAL HIGH (ref 6–20)
CO2: 19 mmol/L — ABNORMAL LOW (ref 22–32)
Calcium: 8.7 mg/dL — ABNORMAL LOW (ref 8.9–10.3)
Chloride: 103 mmol/L (ref 98–111)
Creatinine, Ser: 15.62 mg/dL — ABNORMAL HIGH (ref 0.61–1.24)
GFR, Estimated: 3 mL/min — ABNORMAL LOW (ref >60)
Glucose, Bld: 109 mg/dL — ABNORMAL HIGH (ref 70–99)
Potassium: 4.9 mmol/L (ref 3.5–5.1)
Sodium: 137 mmol/L (ref 135–145)

## 2022-12-13 MED ORDER — LIDOCAINE HCL 1 % IJ SOLN
INTRAMUSCULAR | Status: AC
  Filled 2022-12-13: qty 20

## 2022-12-13 MED ORDER — MIDAZOLAM HCL 2 MG/2ML IJ SOLN
INTRAMUSCULAR | Status: AC
  Filled 2022-12-13: qty 2

## 2022-12-13 MED ORDER — FENTANYL CITRATE (PF) 100 MCG/2ML IJ SOLN
INTRAMUSCULAR | Status: AC
  Filled 2022-12-13: qty 2

## 2022-12-13 MED ORDER — LIDOCAINE HCL 1 % IJ SOLN
10.0000 mL | Freq: Once | INTRAMUSCULAR | Status: AC
  Administered 2022-12-13: 10 mL via INTRADERMAL

## 2022-12-13 MED ORDER — FENTANYL CITRATE (PF) 100 MCG/2ML IJ SOLN
INTRAMUSCULAR | Status: AC | PRN
  Administered 2022-12-13 (×4): 50 ug via INTRAVENOUS

## 2022-12-13 MED ORDER — HYDRALAZINE HCL 20 MG/ML IJ SOLN
INTRAMUSCULAR | Status: AC | PRN
  Administered 2022-12-13: 10 mg via INTRAVENOUS

## 2022-12-13 MED ORDER — HYDRALAZINE HCL 20 MG/ML IJ SOLN
INTRAMUSCULAR | Status: AC
  Filled 2022-12-13: qty 1

## 2022-12-13 MED ORDER — CEFAZOLIN SODIUM-DEXTROSE 2-4 GM/100ML-% IV SOLN
INTRAVENOUS | Status: AC
  Filled 2022-12-13: qty 100

## 2022-12-13 MED ORDER — SODIUM CHLORIDE 0.9% FLUSH: 10.0000 mL | Freq: Two times a day (BID) | INTRAVENOUS | Status: DC

## 2022-12-13 MED ORDER — CEFAZOLIN SODIUM-DEXTROSE 2-4 GM/100ML-% IV SOLN
INTRAVENOUS | Status: AC | PRN
  Administered 2022-12-13: 2 g via INTRAVENOUS

## 2022-12-13 MED ORDER — IOHEXOL 300 MG/ML  SOLN
150.0000 mL | Freq: Once | INTRAMUSCULAR | Status: AC | PRN
  Administered 2022-12-13: 55 mL via INTRAVENOUS

## 2022-12-13 MED ORDER — MIDAZOLAM HCL 2 MG/2ML IJ SOLN
INTRAMUSCULAR | Status: AC | PRN
  Administered 2022-12-13 (×2): 1 mg via INTRAVENOUS
  Administered 2022-12-13 (×2): .5 mg via INTRAVENOUS
  Administered 2022-12-13 (×2): 1 mg via INTRAVENOUS
  Administered 2022-12-13 (×2): .5 mg via INTRAVENOUS

## 2022-12-13 MED ORDER — SODIUM CHLORIDE 0.9% FLUSH: 10.0000 mL | Freq: Two times a day (BID) | INTRAVENOUS | Status: AC

## 2022-12-13 MED ORDER — CEFAZOLIN SODIUM-DEXTROSE 2-4 GM/100ML-% IV SOLN: 2.0000 g | INTRAVENOUS | Status: DC

## 2022-12-13 MED ORDER — DEXTROSE 5 % IV SOLN: 2.0000 g | INTRAVENOUS | Status: AC

## 2022-12-13 NOTE — Procedures (Signed)
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  Angioplasty up to 8 mm balloon.  Markedly improved flow after balloon angioplasty.  Large competing vein in mid forearm.   Complications: None     EBL: Minimal  Plan: Purse string suture placed and it can be removed at next dialysis  Murrel Freet R. Lowella Dandy, MD  Pager: [phone]

## 2022-12-13 NOTE — H&P (Addendum)
Chief Complaint: Patient was seen in consultation today for left arm dialysis fistula evaluation with possible intervention. Possible tunneled dialysis catheter placement at the request of Maxie Barb  Referring Physician(s): Maxie Barb  Supervising Physician: Richarda Overlie  Patient Status: [facility] - Out-pt  History of Present Illness: Kirk [name] is a 62 y.o. 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)    Anxiety    Asthma    Asthma    moderate persistent   Carpal tunnel syndrome    Cellulitis and abscess of trunk    Cholelithiasis 07/13/2014   Chronic headaches    Cigarette smoker 07/11/2017   Stopped 2/022  - referred to start Lung cancer screening 04/2021         Debility, unspecified    Dermatophytosis of the body    Dysrhythmia    history of   Edema    End stage renal disease on dialysis (HCC)    "MWF; [facility] in [place]" (10/21/2014)   Essential hypertension, benign    Generalized anxiety disorder 05/25/2022   GERD (gastroesophageal reflux disease)    Gout, unspecified    HTN (hypertension)    Hypertrophy of prostate without urinary obstruction and other lower urinary tract symptoms (LUTS)    Hypotension, unspecified    Impotence of organic origin    Insomnia, unspecified    Kidney replaced by transplant    Localization-related (focal) (partial) epilepsy and epileptic syndromes with complex partial seizures, without mention of intractable epilepsy    12-15-19- Wife states he has  NEVER had a seizure    Lumbago    Memory loss    OSA on CPAP    Other and unspecified hyperlipidemia    controlled /managed per wife    Other chronic nonalcoholic liver disease    Other malaise and fatigue    Other nonspecific abnormal serum enzyme levels    Pain in joint, lower leg    Pain in joint, upper arm    Pneumonia "several times"   PONV (postoperative nausea and vomiting)    Renal dialysis status(V45.11) 02/05/2010   restarted 01/02/13 ofter renal trransplant failure   Secondary hyperparathyroidism (of renal origin)    Shortness of breath    Sleep apnea    wears cpap    Tension headache    Unspecified constipation    Unspecified essential hypertension    Unspecified hereditary and idiopathic peripheral neuropathy    Unspecified vitamin D deficiency     Past Surgical History:  Procedure Laterality Date   AV FISTULA PLACEMENT Left ?2010   "forearm; at [facility]"   BACK SURGERY     CARDIAC CATHETERIZATION  03/21/2011   CHOLECYSTECTOMY N/A 10/21/2014   Procedure: LAPAROSCOPIC CHOLECYSTECTOMY WITH INTRAOPERATIVE CHOLANGIOGRAM;  Surgeon: Chevis Pretty III, MD;  Location: [facility] OR;  Service: General;  Laterality: N/A;   COLONOSCOPY     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   IR ANGIO INTRA EXTRACRAN SEL INTERNAL CAROTID UNI R  MOD SED  11/30/2021   IR ANGIOGRAM FOLLOW UP STUDY  11/30/2021   IR BONE MARROW BIOPSY & ASPIRATION  03/27/2022   IR NEURO EACH ADD'L AFTER BASIC UNI RIGHT (MS)  11/30/2021   IR TRANSCATH/EMBOLIZ  11/30/2021   KIDNEY TRANSPLANT  08/17/2011   [facility]    LAPAROSCOPIC CHOLECYSTECTOMY  10/21/2014   w/IOC   LEFT HEART CATHETERIZATION WITH CORONARY ANGIOGRAM N/A 03/21/2011   Procedure: LEFT HEART CATHETERIZATION WITH CORONARY ANGIOGRAM;  Surgeon: Chrystie Nose, MD;  Location: [facility] CATH LAB;  Service: Cardiovascular;  Laterality: N/A;   NEPHRECTOMY  08/2013   removed transplaned kidney   POLYPECTOMY     POSTERIOR FUSION CERVICAL SPINE   06/25/2012   for spinal stenosis   RADIOLOGY WITH ANESTHESIA N/A 11/30/2021   Procedure: RIGHT MIDDLE MENINGEAL ARTERY EMBOLIZATION;  Surgeon: Radiologist, Medication, MD;  Location: [facility] OR;  Service: Radiology;  Laterality: N/A;   VASECTOMY  2010    Allergies: Codeine and Hydrocodone-acetaminophen  Medications: Prior to Admission medications   Medication Sig Start Date End Date Taking? Authorizing Provider  acetaminophen (TYLENOL) 500 MG tablet Take 1 tablet (500 mg total) by mouth every 6 (six) hours as needed for moderate pain. 09/12/14   Standley Brooking, MD  albuterol (PROVENTIL) (2.5 MG/3ML) 0.083% nebulizer solution Take 3 mLs (2.5 mg total) by nebulization every 6 (six) hours as needed for wheezing or shortness of breath. 11/13/22   Glenford Bayley, NP  albuterol (VENTOLIN HFA) 108 (90 Base) MCG/ACT inhaler Inhale 2 puffs into the lungs every 6 (six) hours as needed for wheezing or shortness of breath. 10/29/22   Nyoka Cowden, MD  amLODipine (NORVASC) 10 MG tablet TAKE 1 TABLET BY MOUTH EVERY DAY 12/05/22   Ngetich, Dinah C, NP  aspirin 81 MG chewable tablet Chew 81 mg by mouth daily. 09/28/20   [provider]  Azelastine HCl 137 MCG/SPRAY SOLN PLACE 1 SPRAY INTO BOTH NOSTRILS 2 (TWO) TIMES DAILY. USE IN [facility] NOSTRIL AS DIRECTED 10/20/22   Glenford Bayley, NP  calcitRIOL (ROCALTROL) 0.5 MCG capsule Take 2 capsules (1 mcg total) by mouth every Monday, Wednesday, and Friday with hemodialysis. 06/04/18   Black, Lesle Chris, NP  Cholecalciferol (VITAMIN D3) 25 MCG (1000 UT) CAPS Take 4,000 Units by mouth daily.    [provider]  cinacalcet (SENSIPAR) 30 MG tablet Take 2 tablets (60 mg total) by mouth 3 (three) times a week. 11/29/21   Angiulli, Mcarthur Rossetti, PA-C  diphenhydrAMINE (BENADRYL) 25 mg capsule Take 25 mg by mouth every 6 (six) hours as needed for allergies.    [provider]  doxazosin (CARDURA) 8 MG tablet Take 1 tablet (8 mg total) by mouth daily. 04/24/22    Alver Sorrow, NP  ethyl chloride spray SMARTSIG:1 Spray(s) Topical 3 Times a Week 07/18/22   [provider]  fluticasone-salmeterol (ADVAIR HFA) 115-21 MCG/ACT inhaler Inhale 2 puffs into the lungs 2 (two) times daily. 11/01/22   Nyoka Cowden, MD  FOSRENOL 1000 MG PACK Take 1 packet by mouth 3 (three) times daily. 02/26/22   [provider]  guaiFENesin-dextromethorphan (ROBITUSSIN DM) 100-10 MG/5ML syrup Take 5 mLs by mouth every 4 (four) hours as needed for cough. 09/18/22   Glenford Bayley, NP  hydrALAZINE (APRESOLINE) 100 MG tablet Take 1 tablet (100 mg total) by mouth 3 (three) times daily. 05/22/22   Ngetich, Dinah C, NP  isosorbide mononitrate (IMDUR) 120 MG 24 hr tablet Take 1 tablet (  120 mg total) by mouth daily. 06/28/22   Alver Sorrow, NP  methocarbamol (ROBAXIN) 750 MG tablet Take 1 tablet (750 mg total) by mouth 4 (four) times daily. 10/02/22   Ngetich, Dinah C, NP  metoprolol tartrate (LOPRESSOR) 100 MG tablet TAKE 1 TABLET BY MOUTH TWICE A DAY 09/05/22   Ngetich, Dinah C, NP  multivitamin (RENA-VIT) TABS tablet Take 1 tablet by mouth daily. 11/29/21   Angiulli, Mcarthur Rossetti, PA-C  ondansetron (ZOFRAN-ODT) 4 MG disintegrating tablet TAKE 1 TABLET BY MOUTH EVERY 8 HOURS AS NEEDED FOR NAUSEA AND VOMITING 10/02/22   Ngetich, Dinah C, NP  pantoprazole (PROTONIX) 40 MG tablet Take 1 tablet (40 mg total) by mouth 2 (two) times daily before a meal. 10/02/22   Ngetich, Dinah C, NP  pravastatin (PRAVACHOL) 40 MG tablet TAKE 1 TABLET BY MOUTH EVERY DAY IN THE EVENING 12/04/22   Ngetich, Dinah C, NP  sevelamer carbonate (RENVELA) 800 MG tablet Take 2 tablets (1,600 mg total) by mouth as needed (snacks). 11/29/21   Angiulli, Mcarthur Rossetti, PA-C  sucralfate (CARAFATE) 1 GM/10ML suspension Take 10 mLs (1 g total) by mouth 4 (four) times daily -  with meals and at bedtime. 11/29/21   Angiulli, Mcarthur Rossetti, PA-C  traZODone (DESYREL) 50 MG tablet Take 0.5-1 tablets (25-50 mg total) by mouth at  bedtime as needed. for sleep 05/22/22   Ngetich, Donalee Citrin, NP     Family History  Adopted: Yes  Problem Relation Age of Onset   Colon cancer Neg Hx    Esophageal cancer Neg Hx    Rectal cancer Neg Hx    Stomach cancer Neg Hx    Colon polyps Neg Hx     Social History   Socioeconomic History   Marital status: Married    Spouse name: Chyrl Civatte   Number of children: 3   Years of education: Not on file   Highest education level: Not on file  Occupational History   Occupation: disabled    Employer: DISABLED  Tobacco Use   Smoking status: Former    Current packs/day: 0.00    Average packs/day: 0.5 packs/day for 32.0 years (16.0 ttl pk-yrs)    Types: Cigarettes    Start date: 84    Quit date: 02/24/2020    Years since quitting: 2.8   Smokeless tobacco: Never  Vaping Use   Vaping status: Never Used  Substance and Sexual Activity   Alcohol use: No    Alcohol/week: 0.0 standard drinks of alcohol   Drug use: No   Sexual activity: Yes  Other Topics Concern   Not on file  Social History Narrative   Drinks 1 cup of caffeine daily.   Social Determinants of Health   Financial Resource Strain: Medium Risk (11/02/2021)   Overall Financial Resource Strain (CARDIA)    Difficulty of Paying Living Expenses: Somewhat hard  Food Insecurity: No Food Insecurity (04/29/2022)   Hunger Vital Sign    Worried About Running Out of Food in the Last Year: Never true    Ran Out of Food in the Last Year: Never true  Transportation Needs: No Transportation Needs (04/29/2022)   PRAPARE - Administrator, Civil Service (Medical): No    Lack of Transportation (Non-Medical): No  Physical Activity: Insufficiently Active (06/25/2017)   Exercise Vital Sign    Days of Exercise per Week: 3 days    Minutes of Exercise per Session: 20 min  Stress: No Stress Concern Present (06/25/2017)  [employer] of Occupational Health - Occupational Stress Questionnaire    Feeling of Stress : Only a little   Social Connections: Moderately Integrated (06/25/2017)   Social Connection and Isolation Panel [NHANES]    Frequency of Communication with Friends and Family: More than three times a week    Frequency of Social Gatherings with Friends and Family: More than three times a week    Attends Religious Services: More than 4 times per year    Active Member of [employer] or Organizations: No    Attends Banker Meetings: Never    Marital Status: Married   Review of Systems: A 12 point ROS discussed and pertinent positives are indicated in the HPI above.  All other systems are negative.  Review of Systems  Constitutional:  Negative for activity change, fatigue and fever.  Respiratory:  Negative for cough and shortness of breath.   Cardiovascular:  Negative for chest pain.  Gastrointestinal:  Negative for abdominal pain.  Neurological:  Negative for weakness.  Psychiatric/Behavioral:  Negative for behavioral problems and confusion.     Vital Signs: BP (!) 205/85   Pulse 69   Temp 98.6 F (37 C) (Oral)   Resp 20   Ht 5\' 7"  (1.702 m)   Wt 216 lb (98 kg)   SpO2 96%   BMI 33.83 kg/m     Physical Exam Vitals reviewed.  HENT:     Mouth/Throat:     Mouth: Mucous membranes are moist.  Cardiovascular:     Rate and Rhythm: Normal rate and regular rhythm.     Heart sounds: Normal heart sounds.  Pulmonary:     Effort: Pulmonary effort is normal.     Breath sounds: Normal breath sounds. No wheezing.  Abdominal:     Palpations: Abdomen is soft.  Musculoskeletal:        General: Normal range of motion.     Comments: Left arm fistula--- +pulse Weak thrill  Skin:    General: Skin is warm.  Neurological:     Mental Status: He is alert and oriented to person, place, and time.  Psychiatric:        Behavior: Behavior normal.     Imaging: No results found.  Labs:  CBC: Recent Labs    05/22/22 1409 07/24/22 1432 07/30/22 1249 10/25/22 0606  WBC 3.0* 5.2 3.8* 3.4*  HGB  10.4* 12.2* 10.8* 11.1*  HCT 32.3* 36.4* 32.8* 34.7*  PLT 59* 114* 83* 81*    COAGS: Recent Labs    04/28/22 1955  INR 1.1    BMP: Recent Labs    05/09/22 0224 07/24/22 1432 07/30/22 1249 10/25/22 0606  NA 137 140 135 138  K 4.4 4.9 4.5 5.3*  CL 97* 98 95* 97*  CO2 27 29 26 27   GLUCOSE 99 111* 104* 156*  BUN 56* 47* 54* 28*  CALCIUM 9.3 8.6* 8.6* 9.2  CREATININE 7.52* 9.87* 10.20* 8.30*  GFRNONAA 8* 6* 5* 7*    LIVER FUNCTION TESTS: Recent Labs    05/07/22 0327 05/08/22 0227 05/09/22 0224 07/24/22 1432  BILITOT 1.4* 1.1 0.7 0.6  AST 11* 17 12* 12*  ALT 15 18 17 9   ALKPHOS 51 53 50 82  PROT 5.9* 5.8* 6.0* 6.5  ALBUMIN 3.4* 3.2* 3.3* 3.9    TUMOR MARKERS: No results for input(s): "AFPTM", "CEA", "CA199", "CHROMGRNA" in the last 8760 hours.  Assessment and Plan:  Scheduled for left arm dialysis fistula evaluation and possible intervention Possible tunneled dialysis  catheter placement if need Risks and benefits discussed with the patient including, but not limited to bleeding, infection, vascular injury, pulmonary embolism, need for tunneled HD catheter placement or even death.  All of the patient's questions were answered, patient is agreeable to proceed. Consent signed and in chart.  Thank you for this interesting consult.  I greatly enjoyed meeting NICKI SCHNIDER and look forward to participating in their care.  A copy of this report was sent to the requesting provider on this date.  Electronically Signed: Robet Leu, PA-C 12/13/2022, 10:34 AM   I spent a total of  30 Minutes   in face to face in clinical consultation, greater than 50% of which was counseling/coordinating care for dialysis fistula evaluation/poss intervention

## 2022-12-13 NOTE — Progress Notes (Addendum)
Patient and wife was given discharge instructions. Both verbalized understanding. 

## 2022-12-14 DIAGNOSIS — N186 End stage renal disease: Secondary | ICD-10-CM | POA: Diagnosis not present

## 2022-12-14 DIAGNOSIS — Z23 Encounter for immunization: Secondary | ICD-10-CM | POA: Diagnosis not present

## 2022-12-14 DIAGNOSIS — D509 Iron deficiency anemia, unspecified: Secondary | ICD-10-CM | POA: Diagnosis not present

## 2022-12-14 DIAGNOSIS — Z992 Dependence on renal dialysis: Secondary | ICD-10-CM | POA: Diagnosis not present

## 2022-12-14 DIAGNOSIS — N2581 Secondary hyperparathyroidism of renal origin: Secondary | ICD-10-CM | POA: Diagnosis not present

## 2022-12-14 DIAGNOSIS — D631 Anemia in chronic kidney disease: Secondary | ICD-10-CM | POA: Diagnosis not present

## 2022-12-16 DIAGNOSIS — Z23 Encounter for immunization: Secondary | ICD-10-CM | POA: Diagnosis not present

## 2022-12-16 DIAGNOSIS — N2581 Secondary hyperparathyroidism of renal origin: Secondary | ICD-10-CM | POA: Diagnosis not present

## 2022-12-16 DIAGNOSIS — N186 End stage renal disease: Secondary | ICD-10-CM | POA: Diagnosis not present

## 2022-12-16 DIAGNOSIS — D631 Anemia in chronic kidney disease: Secondary | ICD-10-CM | POA: Diagnosis not present

## 2022-12-16 DIAGNOSIS — Z992 Dependence on renal dialysis: Secondary | ICD-10-CM | POA: Diagnosis not present

## 2022-12-16 DIAGNOSIS — D509 Iron deficiency anemia, unspecified: Secondary | ICD-10-CM | POA: Diagnosis not present

## 2022-12-17 ENCOUNTER — Encounter (HOSPITAL_COMMUNITY): Payer: Self-pay | Admitting: Student

## 2022-12-17 ENCOUNTER — Ambulatory Visit (HOSPITAL_COMMUNITY): Payer: Medicare Other | Admitting: Student

## 2022-12-17 ENCOUNTER — Other Ambulatory Visit (HOSPITAL_COMMUNITY): Payer: Self-pay | Admitting: Nephrology

## 2022-12-17 ENCOUNTER — Encounter (HOSPITAL_COMMUNITY): Payer: Self-pay

## 2022-12-17 DIAGNOSIS — N186 End stage renal disease: Secondary | ICD-10-CM

## 2022-12-18 ENCOUNTER — Ambulatory Visit: Payer: Self-pay | Admitting: Licensed Clinical Social Worker

## 2022-12-18 DIAGNOSIS — N2581 Secondary hyperparathyroidism of renal origin: Secondary | ICD-10-CM | POA: Diagnosis not present

## 2022-12-18 DIAGNOSIS — D509 Iron deficiency anemia, unspecified: Secondary | ICD-10-CM | POA: Diagnosis not present

## 2022-12-18 DIAGNOSIS — Z23 Encounter for immunization: Secondary | ICD-10-CM | POA: Diagnosis not present

## 2022-12-18 DIAGNOSIS — D631 Anemia in chronic kidney disease: Secondary | ICD-10-CM | POA: Diagnosis not present

## 2022-12-18 DIAGNOSIS — N186 End stage renal disease: Secondary | ICD-10-CM | POA: Diagnosis not present

## 2022-12-18 DIAGNOSIS — Z992 Dependence on renal dialysis: Secondary | ICD-10-CM | POA: Diagnosis not present

## 2022-12-18 NOTE — Patient Outreach (Signed)
  Care Coordination   Initial Visit Note   12/18/2022 Name: SHAWNDELL MALLERNEE MRN: [id number] DOB: 02-06-1960  [name] is a 62 y.o. year old male who sees Ngetich, Dinah C, NP for primary care. I spoke with  [name] and spouse Chyrl Civatte) by phone today.  What matters to the patients health and wellness today?  Food Insecurities     Goals Addressed             This Visit's Progress    Care Coordination Activities       Care Coordination Interventions: Patient stated that he and his wife just go to one food pantry, Sw also spoke with the patients wife Chyrl Civatte) and the SW will mail out resources for other food pantries. SW also educated the family on the [employer] App.  SW completed the SDOH questionnaire and no other concerns.  Sw will follow up with family in about 2 to 3 weeks.         SDOH assessments and interventions completed:  Yes  SDOH Interventions Today    Flowsheet Row Most Recent Value  SDOH Interventions   Food Insecurity Interventions Other (Comment)  [Food resources will be mailed out to the family and educated about the Greater Dietitian App]  Housing Interventions Intervention Not Indicated  Transportation Interventions Intervention Not Indicated  Utilities Interventions Intervention Not Indicated        Care Coordination Interventions:  Yes, provided  Interventions Today    Flowsheet Row Most Recent Value  General Interventions   General Interventions Discussed/Reviewed General Interventions Discussed, Community Resources  [Food insecurities]        Follow up plan: Follow up call scheduled for 01/07/2023 at 2:00 pm    Encounter Outcome:  Patient Visit Completed   Jeanie Cooks, PhD [facility], [facility] Social Worker Direct Dial: [phone]  Fax: [phone]

## 2022-12-18 NOTE — Patient Instructions (Signed)
Visit Information  Thank you for taking time to visit with me today. Please don't hesitate to contact me if I can be of assistance to you.   Following are the goals we discussed today:   Goals Addressed             This Visit's Progress    Care Coordination Activities       Care Coordination Interventions: Patient stated that he and his wife just go to one food pantry, Sw also spoke with the patients wife Chyrl Civatte) and the SW will mail out resources for other food pantries. SW also educated the family on the [employer] App.  SW completed the SDOH questionnaire and no other concerns.  Sw will follow up with family in about 2 to 3 weeks.         Our next appointment is by telephone on 01/07/2023 at 2:00 pm  Please call the care guide team at [phone] if you need to cancel or reschedule your appointment.   If you are experiencing a Mental Health or Behavioral Health Crisis or need someone to talk to, please call the Suicide and Crisis Lifeline: 988 go to [facility] [address] [place] [phone]) call 911  Patient verbalizes understanding of instructions and care plan provided today and agrees to view in MyChart. Active MyChart status and patient understanding of how to access instructions and care plan via MyChart confirmed with patient.     Jeanie Cooks, PhD [facility], [facility] Social Worker Direct Dial: [phone]  Fax: [phone]

## 2022-12-21 DIAGNOSIS — Z23 Encounter for immunization: Secondary | ICD-10-CM | POA: Diagnosis not present

## 2022-12-21 DIAGNOSIS — N186 End stage renal disease: Secondary | ICD-10-CM | POA: Diagnosis not present

## 2022-12-21 DIAGNOSIS — D509 Iron deficiency anemia, unspecified: Secondary | ICD-10-CM | POA: Diagnosis not present

## 2022-12-21 DIAGNOSIS — D631 Anemia in chronic kidney disease: Secondary | ICD-10-CM | POA: Diagnosis not present

## 2022-12-21 DIAGNOSIS — N2581 Secondary hyperparathyroidism of renal origin: Secondary | ICD-10-CM | POA: Diagnosis not present

## 2022-12-21 DIAGNOSIS — Z992 Dependence on renal dialysis: Secondary | ICD-10-CM | POA: Diagnosis not present

## 2022-12-23 DIAGNOSIS — T8612 Kidney transplant failure: Secondary | ICD-10-CM | POA: Diagnosis not present

## 2022-12-23 DIAGNOSIS — Z992 Dependence on renal dialysis: Secondary | ICD-10-CM | POA: Diagnosis not present

## 2022-12-23 DIAGNOSIS — N186 End stage renal disease: Secondary | ICD-10-CM | POA: Diagnosis not present

## 2022-12-24 DIAGNOSIS — D631 Anemia in chronic kidney disease: Secondary | ICD-10-CM | POA: Diagnosis not present

## 2022-12-24 DIAGNOSIS — N186 End stage renal disease: Secondary | ICD-10-CM | POA: Diagnosis not present

## 2022-12-24 DIAGNOSIS — Z992 Dependence on renal dialysis: Secondary | ICD-10-CM | POA: Diagnosis not present

## 2022-12-24 DIAGNOSIS — D509 Iron deficiency anemia, unspecified: Secondary | ICD-10-CM | POA: Diagnosis not present

## 2022-12-24 DIAGNOSIS — N2581 Secondary hyperparathyroidism of renal origin: Secondary | ICD-10-CM | POA: Diagnosis not present

## 2022-12-26 DIAGNOSIS — N2581 Secondary hyperparathyroidism of renal origin: Secondary | ICD-10-CM | POA: Diagnosis not present

## 2022-12-26 DIAGNOSIS — D509 Iron deficiency anemia, unspecified: Secondary | ICD-10-CM | POA: Diagnosis not present

## 2022-12-26 DIAGNOSIS — Z992 Dependence on renal dialysis: Secondary | ICD-10-CM | POA: Diagnosis not present

## 2022-12-26 DIAGNOSIS — D631 Anemia in chronic kidney disease: Secondary | ICD-10-CM | POA: Diagnosis not present

## 2022-12-26 DIAGNOSIS — N186 End stage renal disease: Secondary | ICD-10-CM | POA: Diagnosis not present

## 2022-12-27 DIAGNOSIS — Z1159 Encounter for screening for other viral diseases: Secondary | ICD-10-CM | POA: Diagnosis not present

## 2022-12-27 DIAGNOSIS — E1121 Type 2 diabetes mellitus with diabetic nephropathy: Secondary | ICD-10-CM | POA: Diagnosis not present

## 2022-12-27 DIAGNOSIS — J4540 Moderate persistent asthma, uncomplicated: Secondary | ICD-10-CM | POA: Diagnosis not present

## 2022-12-27 DIAGNOSIS — Z114 Encounter for screening for human immunodeficiency virus [HIV]: Secondary | ICD-10-CM | POA: Diagnosis not present

## 2022-12-27 DIAGNOSIS — Z125 Encounter for screening for malignant neoplasm of prostate: Secondary | ICD-10-CM | POA: Diagnosis not present

## 2022-12-27 DIAGNOSIS — Z7682 Awaiting organ transplant status: Secondary | ICD-10-CM | POA: Diagnosis not present

## 2022-12-28 DIAGNOSIS — N2581 Secondary hyperparathyroidism of renal origin: Secondary | ICD-10-CM | POA: Diagnosis not present

## 2022-12-28 DIAGNOSIS — N186 End stage renal disease: Secondary | ICD-10-CM | POA: Diagnosis not present

## 2022-12-28 DIAGNOSIS — D631 Anemia in chronic kidney disease: Secondary | ICD-10-CM | POA: Diagnosis not present

## 2022-12-28 DIAGNOSIS — D509 Iron deficiency anemia, unspecified: Secondary | ICD-10-CM | POA: Diagnosis not present

## 2022-12-28 DIAGNOSIS — Z992 Dependence on renal dialysis: Secondary | ICD-10-CM | POA: Diagnosis not present

## 2022-12-31 DIAGNOSIS — N2581 Secondary hyperparathyroidism of renal origin: Secondary | ICD-10-CM | POA: Diagnosis not present

## 2022-12-31 DIAGNOSIS — N186 End stage renal disease: Secondary | ICD-10-CM | POA: Diagnosis not present

## 2022-12-31 DIAGNOSIS — D509 Iron deficiency anemia, unspecified: Secondary | ICD-10-CM | POA: Diagnosis not present

## 2022-12-31 DIAGNOSIS — D631 Anemia in chronic kidney disease: Secondary | ICD-10-CM | POA: Diagnosis not present

## 2022-12-31 DIAGNOSIS — Z992 Dependence on renal dialysis: Secondary | ICD-10-CM | POA: Diagnosis not present

## 2023-01-01 ENCOUNTER — Ambulatory Visit (INDEPENDENT_AMBULATORY_CARE_PROVIDER_SITE_OTHER): Payer: Medicare Other | Admitting: Internal Medicine

## 2023-01-01 ENCOUNTER — Encounter (HOSPITAL_BASED_OUTPATIENT_CLINIC_OR_DEPARTMENT_OTHER): Payer: Self-pay | Admitting: Internal Medicine

## 2023-01-01 VITALS — BP 178/76 | HR 73 | Ht 67.0 in | Wt 206.4 lb

## 2023-01-01 DIAGNOSIS — R5383 Other fatigue: Secondary | ICD-10-CM

## 2023-01-01 DIAGNOSIS — R079 Chest pain, unspecified: Secondary | ICD-10-CM

## 2023-01-01 DIAGNOSIS — I1A0 Resistant hypertension: Secondary | ICD-10-CM

## 2023-01-01 DIAGNOSIS — R0600 Dyspnea, unspecified: Secondary | ICD-10-CM | POA: Diagnosis not present
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
## 2023-01-01 NOTE — Patient Instructions (Addendum)
Medication Instructions:  NO CHANGES  *If you need a refill on your cardiac medications before your next appointment, please call your pharmacy*  Testing/Procedures: Your physician has requested that you have an echocardiogram. Echocardiography is a painless test that uses sound waves to create images of your heart. It provides your doctor with information about the size and shape of your heart and how well your heart's chambers and valves are working. This procedure takes approximately one hour. There are no restrictions for this procedure. Please do NOT wear cologne, perfume, aftershave, or lotions (deodorant is allowed). Please arrive 15 minutes prior to your appointment time.  Please note: We ask at that you not bring children with you during ultrasound (echo/ vascular) testing. Due to room size and safety concerns, children are not allowed in the ultrasound rooms during exams. Our front office staff cannot provide observation of children in our lobby area while testing is being conducted. An adult accompanying a patient to their appointment will only be allowed in the ultrasound room at the discretion of the ultrasound technician under special circumstances. We apologize for any inconvenience.   Cardiac PET Test Instructions   Please report to Radiology at the [facility] Main Entrance 30 minutes early for your test.  [address] [place], [place] [address]                         OR   Please report to Radiology at [facility] Main Entrance, medical mall, 30 mins prior to your test.  [address]  [place], [place]  [phone]  How to Prepare for Your Cardiac PET/CT Stress Test:  Nothing to eat or drink, except water, 3 hours prior to arrival time.  NO caffeine/decaffeinated products, or chocolate 12 hours prior to arrival. (Please note decaffeinated beverages (teas/coffees) still contain caffeine).  If you have caffeine within 12 hours  prior, the test will need to be rescheduled.  Medication instructions: Do not take erectile dysfunction medications for 72 hours prior to test (sildenafil, tadalafil) Do not take nitrates (isosorbide mononitrate, Ranexa) the day before or day of test Do not take tamsulosin the day before or morning of test Hold theophylline containing medications for 12 hours. Hold Dipyridamole 48 hours prior to the test.  Diabetic Preparation: If able to eat breakfast prior to 3 hour fasting, you may take all medications, including your insulin. Do not worry if you miss your breakfast dose of insulin - start at your next meal. If you do not eat prior to 3 hour fast-Hold all diabetes (oral and insulin) medications. Patients who wear a continuous glucose monitor MUST remove the device prior to scanning.  You may take your remaining medications with water.  NO perfume, cologne or lotion on chest or abdomen area. FEMALES - Please avoid wearing dresses to this appointment.  Total time is 1 to 2 hours; you may want to bring reading material for the waiting time.  IF YOU THINK YOU MAY BE PREGNANT, OR ARE NURSING PLEASE INFORM THE TECHNOLOGIST.  In preparation for your appointment, medication and supplies will be purchased.  Appointment availability is limited, so if you need to cancel or reschedule, please call the Radiology Department at [phone] [facility]) [place] [phone] [facility]) 24 hours in advance to avoid a cancellation fee of $100.00  What to Expect When you Arrive:  Once you arrive and check in for your appointment, you will be taken to a  preparation room within the Radiology Department.  A technologist or Nurse will obtain your medical history, verify that you are correctly prepped for the exam, and explain the procedure.  Afterwards, an IV will be started in your arm and electrodes will be placed on your skin for EKG monitoring during the stress portion of the exam. Then you will be escorted to  the PET/CT scanner.  There, staff will get you positioned on the scanner and obtain a blood pressure and EKG.  During the exam, you will continue to be connected to the EKG and blood pressure machines.  A small, safe amount of a radioactive tracer will be injected in your IV to obtain a series of pictures of your heart along with an injection of a stress agent.    After your Exam:  It is recommended that you eat a meal and drink a caffeinated beverage to counter act any effects of the stress agent.  Drink plenty of fluids for the remainder of the day and urinate frequently for the first couple of hours after the exam.  Your doctor will inform you of your test results within 7-10 business days.  For more information and frequently asked questions, please visit our website: [email or web address]  For questions about your test or how to prepare for your test, please call: Cardiac Imaging Nurse Navigators Office: [phone]    Follow-Up: At [facility], you and your health needs are our priority.  As part of our continuing mission to provide you with exceptional heart care, we have created designated Provider Care Teams.  These Care Teams include your primary Cardiologist (physician) and Advanced Practice Providers (APPs -  Physician Assistants and Nurse Practitioners) who all work together to provide you with the care you need, when you need it.  We recommend signing up for the patient portal called "MyChart".  Sign up information is provided on this After Visit Summary.  MyChart is used to connect with patients for Virtual Visits (Telemedicine).  Patients are able to view lab/test results, encounter notes, upcoming appointments, etc.  Non-urgent messages can be sent to your provider as well.   To learn more about what you can do with MyChart, go to [email or web address].    Your next appointment:   2 months with Gillian Shields NP

## 2023-01-02 DIAGNOSIS — D509 Iron deficiency anemia, unspecified: Secondary | ICD-10-CM | POA: Diagnosis not present

## 2023-01-02 DIAGNOSIS — D631 Anemia in chronic kidney disease: Secondary | ICD-10-CM | POA: Diagnosis not present

## 2023-01-02 DIAGNOSIS — N186 End stage renal disease: Secondary | ICD-10-CM | POA: Diagnosis not present

## 2023-01-02 DIAGNOSIS — Z992 Dependence on renal dialysis: Secondary | ICD-10-CM | POA: Diagnosis not present

## 2023-01-02 DIAGNOSIS — N2581 Secondary hyperparathyroidism of renal origin: Secondary | ICD-10-CM | POA: Diagnosis not present

## 2023-01-03 ENCOUNTER — Ambulatory Visit: Payer: Self-pay

## 2023-01-03 NOTE — Patient Outreach (Signed)
  Care Coordination   01/03/2023 Name: Kirk [name] MRN: [id number] DOB: 1960/08/10   Care Coordination Outreach Attempts:  An unsuccessful telephone outreach was attempted for a scheduled appointment today.  Follow Up Plan:  Additional outreach attempts will be made to offer the patient care coordination information and services.   Encounter Outcome:  No Answer   Care Coordination Interventions:  No, not indicated    Delsa Sale RN BSN CCM [place]  [facility], [facility] Nurse Care Coordinator  Direct Dial: [phone] Website: [email or web address]

## 2023-01-04 DIAGNOSIS — Z992 Dependence on renal dialysis: Secondary | ICD-10-CM | POA: Diagnosis not present

## 2023-01-04 DIAGNOSIS — D631 Anemia in chronic kidney disease: Secondary | ICD-10-CM | POA: Diagnosis not present

## 2023-01-04 DIAGNOSIS — N2581 Secondary hyperparathyroidism of renal origin: Secondary | ICD-10-CM | POA: Diagnosis not present

## 2023-01-04 DIAGNOSIS — N186 End stage renal disease: Secondary | ICD-10-CM | POA: Diagnosis not present

## 2023-01-04 DIAGNOSIS — D509 Iron deficiency anemia, unspecified: Secondary | ICD-10-CM | POA: Diagnosis not present

## 2023-01-07 ENCOUNTER — Ambulatory Visit: Payer: Self-pay | Admitting: Licensed Clinical Social Worker

## 2023-01-07 DIAGNOSIS — D509 Iron deficiency anemia, unspecified: Secondary | ICD-10-CM | POA: Diagnosis not present

## 2023-01-07 DIAGNOSIS — Z992 Dependence on renal dialysis: Secondary | ICD-10-CM | POA: Diagnosis not present

## 2023-01-07 DIAGNOSIS — N2581 Secondary hyperparathyroidism of renal origin: Secondary | ICD-10-CM | POA: Diagnosis not present

## 2023-01-07 DIAGNOSIS — N186 End stage renal disease: Secondary | ICD-10-CM | POA: Diagnosis not present

## 2023-01-07 DIAGNOSIS — D631 Anemia in chronic kidney disease: Secondary | ICD-10-CM | POA: Diagnosis not present

## 2023-01-07 NOTE — Patient Outreach (Signed)
  Care Coordination   01/07/2023 Name: Kirk [name] MRN: [id number] DOB: 05/07/1960   Care Coordination Outreach Attempts:  An unsuccessful telephone outreach was attempted for a scheduled appointment today.  Follow Up Plan:  Additional outreach attempts will be made to offer the patient care coordination information and services.   Encounter Outcome:  No Answer   Care Coordination Interventions:  No, not indicated    Jeanie Cooks, PhD [facility], [facility] Social Worker Direct Dial: [phone]  Fax: [phone]

## 2023-01-08 ENCOUNTER — Emergency Department (HOSPITAL_COMMUNITY): Payer: Medicare Other

## 2023-01-08 ENCOUNTER — Telehealth: Payer: Self-pay | Admitting: Primary Care

## 2023-01-08 ENCOUNTER — Inpatient Hospital Stay (HOSPITAL_COMMUNITY)
Admission: EM | Admit: 2023-01-08 | Discharge: 2023-01-12 | DRG: 291 | Disposition: A | Discharge status: Home / Self Care | Payer: Medicare Other | Attending: Internal Medicine | Admitting: Internal Medicine

## 2023-01-08 ENCOUNTER — Other Ambulatory Visit: Payer: Self-pay | Admitting: Family

## 2023-01-08 ENCOUNTER — Ambulatory Visit: Payer: Medicare Other | Admitting: Primary Care

## 2023-01-08 DIAGNOSIS — R0902 Hypoxemia: Secondary | ICD-10-CM | POA: Diagnosis not present

## 2023-01-08 DIAGNOSIS — I132 Hypertensive heart and chronic kidney disease with heart failure and with stage 5 chronic kidney disease, or end stage renal disease: Principal | ICD-10-CM | POA: Diagnosis present

## 2023-01-08 DIAGNOSIS — N250 Renal osteodystrophy: Secondary | ICD-10-CM | POA: Diagnosis not present

## 2023-01-08 DIAGNOSIS — I120 Hypertensive chronic kidney disease with stage 5 chronic kidney disease or end stage renal disease: Secondary | ICD-10-CM | POA: Diagnosis not present

## 2023-01-08 DIAGNOSIS — F411 Generalized anxiety disorder: Secondary | ICD-10-CM | POA: Diagnosis present

## 2023-01-08 DIAGNOSIS — I5033 Acute on chronic diastolic (congestive) heart failure: Secondary | ICD-10-CM

## 2023-01-08 DIAGNOSIS — G4733 Obstructive sleep apnea (adult) (pediatric): Secondary | ICD-10-CM | POA: Diagnosis not present

## 2023-01-08 DIAGNOSIS — Z905 Acquired absence of kidney: Secondary | ICD-10-CM

## 2023-01-08 DIAGNOSIS — G4730 Sleep apnea, unspecified: Secondary | ICD-10-CM | POA: Diagnosis present

## 2023-01-08 DIAGNOSIS — T8612 Kidney transplant failure: Secondary | ICD-10-CM | POA: Diagnosis present

## 2023-01-08 DIAGNOSIS — J9601 Acute respiratory failure with hypoxia: Secondary | ICD-10-CM | POA: Diagnosis not present

## 2023-01-08 DIAGNOSIS — Q8781 Alport syndrome: Secondary | ICD-10-CM

## 2023-01-08 DIAGNOSIS — Z79899 Other long term (current) drug therapy: Secondary | ICD-10-CM

## 2023-01-08 DIAGNOSIS — Z981 Arthrodesis status: Secondary | ICD-10-CM

## 2023-01-08 DIAGNOSIS — D696 Thrombocytopenia, unspecified: Secondary | ICD-10-CM | POA: Diagnosis present

## 2023-01-08 DIAGNOSIS — J690 Pneumonitis due to inhalation of food and vomit: Secondary | ICD-10-CM | POA: Diagnosis present

## 2023-01-08 DIAGNOSIS — I10 Essential (primary) hypertension: Secondary | ICD-10-CM | POA: Diagnosis present

## 2023-01-08 DIAGNOSIS — I428 Other cardiomyopathies: Secondary | ICD-10-CM | POA: Diagnosis present

## 2023-01-08 DIAGNOSIS — D61818 Other pancytopenia: Secondary | ICD-10-CM | POA: Diagnosis present

## 2023-01-08 DIAGNOSIS — D631 Anemia in chronic kidney disease: Secondary | ICD-10-CM | POA: Diagnosis present

## 2023-01-08 DIAGNOSIS — Z885 Allergy status to narcotic agent status: Secondary | ICD-10-CM

## 2023-01-08 DIAGNOSIS — H9190 Unspecified hearing loss, unspecified ear: Secondary | ICD-10-CM | POA: Diagnosis present

## 2023-01-08 DIAGNOSIS — N2581 Secondary hyperparathyroidism of renal origin: Secondary | ICD-10-CM | POA: Diagnosis present

## 2023-01-08 DIAGNOSIS — J189 Pneumonia, unspecified organism: Principal | ICD-10-CM | POA: Diagnosis present

## 2023-01-08 DIAGNOSIS — E785 Hyperlipidemia, unspecified: Secondary | ICD-10-CM | POA: Diagnosis present

## 2023-01-08 DIAGNOSIS — J810 Acute pulmonary edema: Secondary | ICD-10-CM | POA: Diagnosis not present

## 2023-01-08 DIAGNOSIS — M898X9 Other specified disorders of bone, unspecified site: Secondary | ICD-10-CM | POA: Diagnosis present

## 2023-01-08 DIAGNOSIS — I5023 Acute on chronic systolic (congestive) heart failure: Secondary | ICD-10-CM

## 2023-01-08 DIAGNOSIS — J90 Pleural effusion, not elsewhere classified: Secondary | ICD-10-CM

## 2023-01-08 DIAGNOSIS — Z7951 Long term (current) use of inhaled steroids: Secondary | ICD-10-CM

## 2023-01-08 DIAGNOSIS — G40209 Localization-related (focal) (partial) symptomatic epilepsy and epileptic syndromes with complex partial seizures, not intractable, without status epilepticus: Secondary | ICD-10-CM | POA: Diagnosis present

## 2023-01-08 DIAGNOSIS — I161 Hypertensive emergency: Secondary | ICD-10-CM | POA: Diagnosis not present

## 2023-01-08 DIAGNOSIS — Z8719 Personal history of other diseases of the digestive system: Secondary | ICD-10-CM

## 2023-01-08 DIAGNOSIS — R918 Other nonspecific abnormal finding of lung field: Secondary | ICD-10-CM | POA: Diagnosis not present

## 2023-01-08 DIAGNOSIS — N186 End stage renal disease: Secondary | ICD-10-CM | POA: Diagnosis present

## 2023-01-08 DIAGNOSIS — Z992 Dependence on renal dialysis: Secondary | ICD-10-CM

## 2023-01-08 DIAGNOSIS — Z9852 Vasectomy status: Secondary | ICD-10-CM

## 2023-01-08 DIAGNOSIS — Z7982 Long term (current) use of aspirin: Secondary | ICD-10-CM

## 2023-01-08 DIAGNOSIS — E8779 Other fluid overload: Secondary | ICD-10-CM | POA: Diagnosis not present

## 2023-01-08 DIAGNOSIS — K219 Gastro-esophageal reflux disease without esophagitis: Secondary | ICD-10-CM | POA: Diagnosis present

## 2023-01-08 DIAGNOSIS — I5043 Acute on chronic combined systolic (congestive) and diastolic (congestive) heart failure: Secondary | ICD-10-CM | POA: Diagnosis present

## 2023-01-08 DIAGNOSIS — Z9049 Acquired absence of other specified parts of digestive tract: Secondary | ICD-10-CM

## 2023-01-08 DIAGNOSIS — N400 Enlarged prostate without lower urinary tract symptoms: Secondary | ICD-10-CM | POA: Diagnosis present

## 2023-01-08 DIAGNOSIS — I517 Cardiomegaly: Secondary | ICD-10-CM | POA: Diagnosis not present

## 2023-01-08 DIAGNOSIS — Z1152 Encounter for screening for COVID-19: Secondary | ICD-10-CM

## 2023-01-08 DIAGNOSIS — R0602 Shortness of breath: Secondary | ICD-10-CM | POA: Diagnosis not present

## 2023-01-08 DIAGNOSIS — J45909 Unspecified asthma, uncomplicated: Secondary | ICD-10-CM | POA: Diagnosis present

## 2023-01-08 DIAGNOSIS — R519 Headache, unspecified: Secondary | ICD-10-CM | POA: Diagnosis not present

## 2023-01-08 DIAGNOSIS — J168 Pneumonia due to other specified infectious organisms: Secondary | ICD-10-CM | POA: Diagnosis not present

## 2023-01-08 DIAGNOSIS — I2510 Atherosclerotic heart disease of native coronary artery without angina pectoris: Secondary | ICD-10-CM | POA: Diagnosis not present

## 2023-01-08 DIAGNOSIS — Z87891 Personal history of nicotine dependence: Secondary | ICD-10-CM

## 2023-01-08 DIAGNOSIS — Z792 Long term (current) use of antibiotics: Secondary | ICD-10-CM

## 2023-01-08 LAB — CBC WITH DIFFERENTIAL/PLATELET
Abs Immature Granulocytes: 0 10*3/uL (ref 0.00–0.07)
Abs Immature Granulocytes: 0.01 10*3/uL (ref 0.00–0.07)
Basophils Absolute: 0 10*3/uL (ref 0.0–0.1)
Basophils Absolute: 0.1 10*3/uL (ref 0.0–0.1)
Basophils Relative: 2 %
Basophils Relative: 1 %
Eosinophils Absolute: 0 10*3/uL (ref 0.0–0.5)
Eosinophils Absolute: 0.1 10*3/uL (ref 0.0–0.5)
Eosinophils Relative: 2 %
Eosinophils Relative: 2 %
HCT: 53.9 % — ABNORMAL HIGH (ref 39.0–52.0)
HCT: 26.3 % — ABNORMAL LOW (ref 39.0–52.0)
Hemoglobin: 17.1 g/dL — ABNORMAL HIGH (ref 13.0–17.0)
Hemoglobin: 8.5 g/dL — ABNORMAL LOW (ref 13.0–17.0)
Immature Granulocytes: 0 %
Immature Granulocytes: 0 %
Lymphocytes Relative: 13 %
Lymphocytes Relative: 20 %
Lymphs Abs: 0.2 10*3/uL — ABNORMAL LOW (ref 0.7–4.0)
Lymphs Abs: 0.8 10*3/uL (ref 0.7–4.0)
MCH: 31.6 pg (ref 26.0–34.0)
MCH: 32.8 pg (ref 26.0–34.0)
MCHC: 31.7 g/dL (ref 30.0–36.0)
MCHC: 32.3 g/dL (ref 30.0–36.0)
MCV: 99.6 fL (ref 80.0–100.0)
MCV: 101.5 fL — ABNORMAL HIGH (ref 80.0–100.0)
Monocytes Absolute: 0.1 10*3/uL (ref 0.1–1.0)
Monocytes Absolute: 0.4 10*3/uL (ref 0.1–1.0)
Monocytes Relative: 10 %
Monocytes Relative: 12 %
Neutro Abs: 1 10*3/uL — ABNORMAL LOW (ref 1.7–7.7)
Neutro Abs: 2.4 10*3/uL (ref 1.7–7.7)
Neutrophils Relative %: 73 %
Neutrophils Relative %: 65 %
Platelets: 38 10*3/uL — ABNORMAL LOW (ref 150–400)
Platelets: 98 10*3/uL — ABNORMAL LOW (ref 150–400)
RBC: 5.41 MIL/uL (ref 4.22–5.81)
RBC: 2.59 MIL/uL — ABNORMAL LOW (ref 4.22–5.81)
RDW: 14.6 % (ref 11.5–15.5)
RDW: 14.3 % (ref 11.5–15.5)
WBC: 1.4 10*3/uL — CL (ref 4.0–10.5)
WBC: 3.8 10*3/uL — ABNORMAL LOW (ref 4.0–10.5)
nRBC: 0 % (ref 0.0–0.2)
nRBC: 0 % (ref 0.0–0.2)

## 2023-01-08 LAB — COMPREHENSIVE METABOLIC PANEL
ALT: 13 U/L (ref 0–44)
AST: 13 U/L — ABNORMAL LOW (ref 15–41)
Albumin: 3.6 g/dL (ref 3.5–5.0)
Alkaline Phosphatase: 63 U/L (ref 38–126)
Anion gap: 15 (ref 5–15)
BUN: 30 mg/dL — ABNORMAL HIGH (ref 6–20)
CO2: 24 mmol/L (ref 22–32)
Calcium: 9 mg/dL (ref 8.9–10.3)
Chloride: 103 mmol/L (ref 98–111)
Creatinine, Ser: 10.4 mg/dL — ABNORMAL HIGH (ref 0.61–1.24)
GFR, Estimated: 5 mL/min — ABNORMAL LOW (ref >60)
Glucose, Bld: 117 mg/dL — ABNORMAL HIGH (ref 70–99)
Potassium: 4.1 mmol/L (ref 3.5–5.1)
Sodium: 142 mmol/L (ref 135–145)
Total Bilirubin: 0.7 mg/dL (ref <1.2)
Total Protein: 6.7 g/dL (ref 6.5–8.1)

## 2023-01-08 LAB — TROPONIN I (HIGH SENSITIVITY)
Troponin I (High Sensitivity): 36 ng/L — ABNORMAL HIGH (ref <18)
Troponin I (High Sensitivity): 33 ng/L — ABNORMAL HIGH (ref <18)

## 2023-01-08 LAB — I-STAT VENOUS BLOOD GAS, ED
Acid-Base Excess: 2 mmol/L (ref 0.0–2.0)
Bicarbonate: 26.4 mmol/L (ref 20.0–28.0)
Calcium, Ion: 1.07 mmol/L — ABNORMAL LOW (ref 1.15–1.40)
HCT: 29 % — ABNORMAL LOW (ref 39.0–52.0)
Hemoglobin: 9.9 g/dL — ABNORMAL LOW (ref 13.0–17.0)
O2 Saturation: 86 %
Potassium: 4.1 mmol/L (ref 3.5–5.1)
Sodium: 141 mmol/L (ref 135–145)
TCO2: 28 mmol/L (ref 22–32)
pCO2, Ven: 40.1 mm[Hg] — ABNORMAL LOW (ref 44–60)
pH, Ven: 7.427 (ref 7.25–7.43)
pO2, Ven: 50 mm[Hg] — ABNORMAL HIGH (ref 32–45)

## 2023-01-08 LAB — BRAIN NATRIURETIC PEPTIDE: B Natriuretic Peptide: 1590.5 pg/mL — ABNORMAL HIGH (ref 0.0–100.0)

## 2023-01-08 LAB — I-STAT CG4 LACTIC ACID, ED: Lactic Acid, Venous: 0.6 mmol/L (ref 0.5–1.9)

## 2023-01-08 MED ORDER — SODIUM CHLORIDE 0.9 % IV SOLN
500.0000 mg | Freq: Once | INTRAVENOUS | Status: DC
  Filled 2023-01-08: qty 5

## 2023-01-08 MED ORDER — NITROGLYCERIN 0.4 MG SL SUBL: 0.4000 mg | SUBLINGUAL_TABLET | Freq: Once | SUBLINGUAL | Status: DC

## 2023-01-08 MED ORDER — ACETAMINOPHEN 500 MG PO TABS
1000.0000 mg | ORAL_TABLET | Freq: Once | ORAL | Status: AC
  Administered 2023-01-08: 1000 mg via ORAL
  Filled 2023-01-08: qty 2

## 2023-01-08 MED ORDER — AZITHROMYCIN 250 MG PO TABS: 500.0000 mg | ORAL_TABLET | Freq: Once | ORAL | Status: DC

## 2023-01-08 MED ORDER — NITROGLYCERIN 0.4 MG SL SUBL: 0.4000 mg | SUBLINGUAL_TABLET | SUBLINGUAL | Status: DC | PRN

## 2023-01-08 MED ORDER — IOHEXOL 350 MG/ML SOLN
75.0000 mL | Freq: Once | INTRAVENOUS | Status: AC | PRN
  Administered 2023-01-08: 75 mL via INTRAVENOUS

## 2023-01-08 MED ORDER — METRONIDAZOLE 500 MG/100ML IV SOLN
500.0000 mg | Freq: Once | INTRAVENOUS | Status: AC
  Administered 2023-01-09: 500 mg via INTRAVENOUS
  Filled 2023-01-08: qty 100

## 2023-01-08 MED ORDER — LABETALOL HCL 5 MG/ML IV SOLN
10.0000 mg | Freq: Once | INTRAVENOUS | Status: AC
  Administered 2023-01-08: 10 mg via INTRAVENOUS
  Filled 2023-01-08: qty 4

## 2023-01-08 MED ORDER — SODIUM CHLORIDE 0.9 % IV SOLN
1.0000 g | Freq: Once | INTRAVENOUS | Status: AC
  Administered 2023-01-08: 1 g via INTRAVENOUS
  Filled 2023-01-08: qty 10

## 2023-01-08 MED ORDER — CHLORHEXIDINE GLUCONATE CLOTH 2 % EX PADS
6.0000 | MEDICATED_PAD | Freq: Every day | CUTANEOUS | Status: DC
  Administered 2023-01-10 – 2023-01-11 (×2): 6 via TOPICAL

## 2023-01-08 NOTE — ED Provider Notes (Signed)
[place] EMERGENCY DEPARTMENT AT [facility] Provider Note   CSN: [id number] Arrival date & time: 01/08/23  1850     History  Chief Complaint  Patient presents with   Shortness of Breath    Kirk [name] is a 62 y.o. male, hx of systolic heart failure, ESRD on dialysis, who presents to the ED 2/2 to SOBx2-3 weeks. Reports he has been trying to manage it at home conservatively, going to dialysis as instructed, but states symptoms are just getting worse. Denies any fevers, chills. States he doesn't make urine.   Home Medications Prior to Admission medications   Medication Sig Start Date End Date Taking? Authorizing Provider  acetaminophen (TYLENOL) 500 MG tablet Take 1 tablet (500 mg total) by mouth every 6 (six) hours as needed for moderate pain. 09/12/14  Yes Standley Brooking, MD  albuterol (PROVENTIL) (2.5 MG/3ML) 0.083% nebulizer solution Take 3 mLs (2.5 mg total) by nebulization every 6 (six) hours as needed for wheezing or shortness of breath. 11/13/22  Yes Glenford Bayley, NP  albuterol (VENTOLIN HFA) 108 (90 Base) MCG/ACT inhaler Inhale 2 puffs into the lungs every 6 (six) hours as needed for wheezing or shortness of breath. 10/29/22  Yes Nyoka Cowden, MD  amLODipine (NORVASC) 10 MG tablet TAKE 1 TABLET BY MOUTH EVERY DAY 12/05/22  Yes Ngetich, Dinah C, NP  aspirin 81 MG chewable tablet Chew 81 mg by mouth daily. 09/28/20  Yes [provider]  calcitRIOL (ROCALTROL) 0.5 MCG capsule Take 2 capsules (1 mcg total) by mouth every Monday, Wednesday, and Friday with hemodialysis. 06/04/18  Yes Black, Lesle Chris, NP  Cholecalciferol (VITAMIN D3) 25 MCG (1000 UT) CAPS Take 4,000 Units by mouth daily. Take at home on Tues, Thurs, Sat, and Sun   Yes [provider]  cinacalcet (SENSIPAR) 30 MG tablet Take 2 tablets (60 mg total) by mouth 3 (three) times a week. 11/29/21  Yes Angiulli, Mcarthur Rossetti, PA-C  diphenhydrAMINE (BENADRYL) 25 mg capsule Take 25 mg by mouth  every 6 (six) hours as needed for allergies.   Yes [provider]  doxazosin (CARDURA) 8 MG tablet Take 1 tablet (8 mg total) by mouth daily. Patient taking differently: Take 8 mg by mouth at bedtime. 04/24/22  Yes Alver Sorrow, NP  fluticasone-salmeterol (ADVAIR HFA) 253-66 MCG/ACT inhaler Inhale 2 puffs into the lungs 2 (two) times daily. 11/01/22  Yes Nyoka Cowden, MD  FOSRENOL 1000 MG PACK Take 1 packet by mouth with breakfast, with lunch, and with evening meal. Also take when eating snacks 02/26/22  Yes [provider]  guaiFENesin-dextromethorphan (ROBITUSSIN DM) 100-10 MG/5ML syrup Take 5 mLs by mouth every 4 (four) hours as needed for cough. 09/18/22  Yes Glenford Bayley, NP  hydrALAZINE (APRESOLINE) 100 MG tablet Take 1 tablet (100 mg total) by mouth 3 (three) times daily. 05/22/22  Yes Ngetich, Dinah C, NP  isosorbide mononitrate (IMDUR) 120 MG 24 hr tablet Take 1 tablet (120 mg total) by mouth daily. Patient taking differently: Take 120 mg by mouth at bedtime. 06/28/22  Yes Alver Sorrow, NP  metoprolol tartrate (LOPRESSOR) 100 MG tablet TAKE 1 TABLET BY MOUTH TWICE A DAY 09/05/22  Yes Ngetich, Dinah C, NP  multivitamin (RENA-VIT) TABS tablet Take 1 tablet by mouth daily. 11/29/21  Yes Angiulli, Mcarthur Rossetti, PA-C  pantoprazole (PROTONIX) 40 MG tablet Take 1 tablet (40 mg total) by mouth 2 (two) times daily before a meal. 10/02/22  Yes  Ngetich, Dinah C, NP  pravastatin (PRAVACHOL) 40 MG tablet TAKE 1 TABLET BY MOUTH EVERY DAY IN THE EVENING 12/04/22  Yes Ngetich, Dinah C, NP  sevelamer carbonate (RENVELA) 800 MG tablet Take 2 tablets (1,600 mg total) by mouth as needed (snacks). Patient taking differently: Take 1,600 mg by mouth 4 (four) times daily. Take with a meal and snacks 11/29/21  Yes Angiulli, Mcarthur Rossetti, PA-C  amoxicillin (AMOXIL) 500 MG tablet Take 500 mg by mouth 2 (two) times daily. 01/07/23   [provider]  ondansetron (ZOFRAN-ODT) 4 MG  disintegrating tablet TAKE 1 TABLET BY MOUTH EVERY 8 HOURS AS NEEDED FOR NAUSEA AND VOMITING 10/02/22   Ngetich, Dinah C, NP  traZODone (DESYREL) 50 MG tablet Take 0.5-1 tablets (25-50 mg total) by mouth at bedtime as needed. for sleep 05/22/22   Ngetich, Dinah C, NP      Allergies    Codeine and Hydrocodone-acetaminophen    Review of Systems   Review of Systems  Respiratory:  Positive for shortness of breath.   Cardiovascular:  Negative for chest pain.    Physical Exam Updated Vital Signs BP (!) 187/81   Pulse 70   Temp 99.2 F (37.3 C) (Oral)   Resp (!) 22   SpO2 100%  Physical Exam Vitals and nursing note reviewed.  Constitutional:      General: He is not in acute distress.    Appearance: He is well-developed.  HENT:     Head: Normocephalic and atraumatic.  Eyes:     Conjunctiva/sclera: Conjunctivae normal.  Cardiovascular:     Rate and Rhythm: Normal rate and regular rhythm.     Heart sounds: No murmur heard. Pulmonary:     Effort: Tachypnea and respiratory distress present.     Comments: +Crackles throughout Abdominal:     Palpations: Abdomen is soft.     Tenderness: There is no abdominal tenderness.  Musculoskeletal:        General: No swelling.     Cervical back: Neck supple.  Skin:    General: Skin is warm and dry.     Capillary Refill: Capillary refill takes less than 2 seconds.  Neurological:     Mental Status: He is alert.  Psychiatric:        Mood and Affect: Mood normal.     ED Results / Procedures / Treatments   Labs (all labs ordered are listed, but only abnormal results are displayed) Labs Reviewed  COMPREHENSIVE METABOLIC PANEL - Abnormal; Notable for the following components:      Result Value   Glucose, Bld 117 (*)    BUN 30 (*)    Creatinine, Ser 10.40 (*)    AST 13 (*)    GFR, Estimated 5 (*)    All other components within normal limits  CBC WITH DIFFERENTIAL/PLATELET - Abnormal; Notable for the following components:   WBC 1.4 (*)     Hemoglobin 17.1 (*)    HCT 53.9 (*)    Platelets 38 (*)    Neutro Abs 1.0 (*)    Lymphs Abs 0.2 (*)    All other components within normal limits  BRAIN NATRIURETIC PEPTIDE - Abnormal; Notable for the following components:   B Natriuretic Peptide 1,590.5 (*)    All other components within normal limits  CBC WITH DIFFERENTIAL/PLATELET - Abnormal; Notable for the following components:   WBC 3.8 (*)    RBC 2.59 (*)    Hemoglobin 8.5 (*)    HCT 26.3 (*)  MCV 101.5 (*)    Platelets 98 (*)    All other components within normal limits  I-STAT VENOUS BLOOD GAS, ED - Abnormal; Notable for the following components:   pCO2, Ven 40.1 (*)    pO2, Ven 50 (*)    Calcium, Ion 1.07 (*)    HCT 29.0 (*)    Hemoglobin 9.9 (*)    All other components within normal limits  TROPONIN I (HIGH SENSITIVITY) - Abnormal; Notable for the following components:   Troponin I (High Sensitivity) 36 (*)    All other components within normal limits  TROPONIN I (HIGH SENSITIVITY) - Abnormal; Notable for the following components:   Troponin I (High Sensitivity) 33 (*)    All other components within normal limits  CBC WITH DIFFERENTIAL/PLATELET  HEPATITIS B SURFACE ANTIGEN  HEPATITIS B SURFACE ANTIBODY, QUANTITATIVE  I-STAT CG4 LACTIC ACID, ED    EKG EKG Interpretation Date/Time:  Tuesday January 08 2023 19:02:38 EST Ventricular Rate:  90 PR Interval:  173 QRS Duration:  85 QT Interval:  391 QTC Calculation: 479 R Axis:   73  Text Interpretation: Sinus rhythm Ventricular premature complex Left ventricular hypertrophy Borderline prolonged QT interval No significant change since last tracing Confirmed by Alvira Monday ([id number]) on 01/08/2023 9:16:24 PM  Radiology CT Angio Chest PE W and/or Wo Contrast Result Date: 01/08/2023 CLINICAL DATA:  Hypoxia and shortness of breath EXAM: CT ANGIOGRAPHY CHEST WITH CONTRAST TECHNIQUE: Multidetector CT imaging of the chest was performed using the standard protocol  during bolus administration of intravenous contrast. Multiplanar CT image reconstructions and MIPs were obtained to evaluate the vascular anatomy. RADIATION DOSE REDUCTION: This exam was performed according to the departmental dose-optimization program which includes automated exposure control, adjustment of the mA and/or kV according to patient size and/or use of iterative reconstruction technique. CONTRAST:  75mL OMNIPAQUE IOHEXOL 350 MG/ML SOLN COMPARISON:  Same day chest radiograph and CT chest 05/01/2022 FINDINGS: Cardiovascular: Negative for acute pulmonary embolism. No pericardial effusion. Coronary artery and aortic atherosclerotic calcification. Mediastinum/Nodes: Trachea and esophagus are unremarkable. Unchanged prominent right hilar and subcarinal lymph node measuring up to 1.0 cm. Lungs/Pleura: Interlobular septal thickening patchy ground-glass opacities greatest in the lower lungs. Bronchial wall thickening with mucous plugging. Centrilobular micro nodules and abutting tree opacities in the lower lungs. Sharvi Mooneyhan right and trace left pleural effusions. No pneumothorax. Upper Abdomen: No acute abnormality. Musculoskeletal: No acute fracture. Review of the MIP images confirms the above findings. IMPRESSION: 1. Negative for acute pulmonary embolism. 2. Airway inflammation/infection and/or aspiration greatest in the lower lobes. Quanita Barona bilateral pleural effusions. Aortic Atherosclerosis (ICD10-I70.0). Electronically Signed   By: Minerva Fester M.D.   On: 01/08/2023 22:29   DG Chest Portable 1 View Result Date: 01/08/2023 CLINICAL DATA:  Shortness of breath. EXAM: PORTABLE CHEST 1 VIEW COMPARISON:  Chest radiographs 10/25/2022 and 07/30/2022; CT chest 05/01/2022 FINDINGS: Cardiac silhouette is again mildly enlarged. Trace pericardial fluid was seen on 05/01/2022 chest CT. Chroniclower lung reticular opacities are similar to 10/25/2022. No definite acute airspace opacity. No pleural effusion pneumothorax. No  significant skeletal abnormality. IMPRESSION: 1. Chronic lower lung reticular opacities are similar to 10/25/2022 radiograph and 05/01/2022 CT, likely chronic lung disease. No definite acute airspace opacity. 2. Mild cardiomegaly. Electronically Signed   By: Neita Garnet M.D.   On: 01/08/2023 20:51    Procedures Procedures    Medications Ordered in ED Medications  nitroGLYCERIN (NITROSTAT) SL tablet 0.4 mg (has no administration in time range)  Chlorhexidine Gluconate Cloth  2 % PADS 6 each (has no administration in time range)  cefTRIAXone (ROCEPHIN) 1 g in sodium chloride 0.9 % 100 mL IVPB (1 g Intravenous New Bag/Given 01/08/23 2342)  metroNIDAZOLE (FLAGYL) IVPB 500 mg (has no administration in time range)  azithromycin (ZITHROMAX) 500 mg in sodium chloride 0.9 % 250 mL IVPB (has no administration in time range)  labetalol (NORMODYNE) injection 10 mg (10 mg Intravenous Given 01/08/23 2129)  iohexol (OMNIPAQUE) 350 MG/ML injection 75 mL (75 mLs Intravenous Contrast Given 01/08/23 2153)  acetaminophen (TYLENOL) tablet 1,000 mg (1,000 mg Oral Given 01/08/23 2343)    ED Course/ Medical Decision Making/ A&P                                 Medical Decision Making Patient is a 62 year old male, here for shortness of breath that is been going on for the last few weeks.  He states been trying to manage it at home conservatively, and he has been going to dialysis as instructed.  Denies any fevers, chills.  Will obtain chest x-ray, blood work including CBC, BMP, BNP, and VBG, as well as lactic acid for further evaluation.  Patient is currently improved on BiPAP  Amount and/or Complexity of Data Reviewed Labs: ordered.    Details: Creatinine baseline, troponin mildly elevated at 36, downtrending to 33 Radiology: ordered.    Details: Chest x-ray shows no acute findings, CTA, shows findings suspicious of infection, bilateral lower lobes, concerning for possible aspiration versus pneumonia?  Kirk [name]  bilateral pleural effusions Discussion of management or test interpretation with external provider(s): Patient still requiring BiPAP, and is breathing better, but without BiPAP patient struggling with breathing. Troponins mildly elevated likely 2/2 to ESRD and pneumonia, downtrending though. Will admit to hospitalist for further IV antibiotics for bilateral pneumonia, has been started on Ceftriaxone, flagyl, and azithromycin 2/2 to concern for atypical vs aspiration pneumonia. He reports he does struggle with swallowing. Spoke with Dr. Julian Reil, he will evaluate the patient at bedside and let Lisette Abu PA know his decision on admission.   Risk OTC drugs. Prescription drug management. Decision regarding hospitalization.    Final Clinical Impression(s) / ED Diagnoses Final diagnoses:  Pneumonia of both lower lobes due to infectious organism  Bilateral pleural effusion    Rx / DC Orders ED Discharge Orders     None         Kayde Atkerson, Harley [place], PA 01/08/23 2355    Alvira Monday, MD 01/10/23 1007

## 2023-01-08 NOTE — Progress Notes (Deleted)
@Patient  ID: Kirk [name], male    DOB: Jun 21, 1960, 62 y.o.   MRN: [id number]  No chief complaint on file.   Referring provider: Caesar Bookman, NP  HPI:  62 year old male, Former smoker quit in April 2022 (16-pack-year history).  Past medical history significant for mild persistent asthma, systolic heart failure, hypertension, OSA on CPAP, type 2 diabetes, end-stage renal disease on dialysis.  Patient of Dr. Sherene Sires.  Maintained on Symbicort 160, as needed albuterol HFA or DuoNeb.  Spirometry in February 2023 showed moderate airway obstruction/FEV 1 42%.   09/18/2022 Patient presents today for overdue follow-up asthma. Patient follows with Dr. Sherene Sires. Reports increased shortness of breath symptoms with exertion and cough since having rhinovirus and RSV in April 2024. These symptoms are not new. He gets winded when walking. He ran out of symbicort last month. Symptoms were previously well controlled. He has associated sinus congestion/PND. Cough is productive with clear sputum is clear. He has not tried any nasal sprays. CT chest in April 2024 showed mucus plugging, mild consolidations and numerous small pulmonary nodules most prominent in lower lungs, slightly worse when compared to prior exam.  Findings felt to be likely due to infection or aspiration. CXR in July without acute cardiopulmonary process.  10/23/2022 Patient was treated for an episode of bronchitis back in August with Doxycycline and Robitussin DM. Accompanied by his wife/ daughter. He reports that his cough is no better. He has a persistent daily cough daily which is occasionally productive. Dyspnea is prominently with exertion. Associated wheezing. He is maintained on Symbicort 160. CXR in July was normal   Recommendations: Take mucinex 600-1200mg  twice daily x 5-7 days  (not with robitussin)   Orders: Depo-medrol 80mg  IM x1 CXR and labs    Rx: Prednisone taper    Follow-up: 2-4 weeks with Beth NP     11/13/2022 Patient presents today for 2-4 week follow-up bronchitis. Seen on 10/23/22. Received depo-medrol 80mg  IM x1 and prednisone taper. CXR ordered but not completed. He was seen in ED on 10/25/22 for acute bronchospasm, tachypnea and wheezing noted.  Rceived albuterol 5mg  and atrovent nebulizer. Provided with aeroschamer, lost his nebulizer during move. CXR showed reticulation lower lungs which correlated with chronic lung disease seen with prior CT, no acute superimposed process.   CT chest April 2024 showed mucous plugging, mild consolidations with numerous small pulmonary nodules most pronounced in lower lungs. Findings likely due to infection or aspiration.   Chronic bronchitis with acute exacerbation (HCC) - Chronic daily cough with some mucus production. Associated exertional dyspnea and wheezing  - Patient had evidence of mucus plugging on CT chest in [place] 2024. CXR in July was normal.  - Trial Breztri Aerosphere in place of Symbicort - Start Albuterol nebulizer twice daily followed by flutter valve and cough/deep breathing exercises  - Continue Mucinex 600mg  twice daily - DME order for nebulizer machine  Recommendations: Use nebulizer twice a day, followed by flutter valve and coughing exercises  Continue mucinex 600mg  twice daily Trial Breztri Aerosphere two puffs twice daily Stop Symbicort  Orders: Nebulizer machine re: chronic bronchitis   Follow-up: 8 weeks with Beth NP   01/08/2023- Interim hx  Patient presents today for 28-month follow-up.  During his last visit in October he was noted to have a chronic cough with mucus production, exertional dyspnea and wheezing symptoms. Patient had evidence of mucus plugging on CT chest in April 2024. CXR in July was normal.  He was given trial of [employer]  Aerosphere in place of Symbicort.  Advised patient take Mucinex 600 mg twice daily, use albuterol nebulizer twice daily followed by flutter valve and cough deep breathing  exercises.      Allergies  Allergen Reactions   Codeine Nausea And Vomiting   Hydrocodone-Acetaminophen Nausea And Vomiting    Immunization History  Administered Date(s) Administered   Influenza Whole 11/01/2010   Influenza, Seasonal, Injecte, Preservative Fre 10/29/2012   Influenza-Unspecified 11/21/2011, 11/05/2012, 09/22/2013, 09/22/2017, 11/23/2019, 10/28/2020, 12/04/2020   Moderna Sars-Covid-2 Vaccination 04/06/2019, 05/07/2019, 03/31/2020   PPD Test 12/26/2012   Pneumococcal Polysaccharide-23 11/01/2010, 10/29/2012   Pneumococcal-Unspecified 11/05/2012   Tdap 01/22/2006, 06/08/2021    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)    Anxiety    Asthma    Asthma    moderate persistent   Carpal tunnel syndrome    Cellulitis and abscess of trunk    Cholelithiasis 07/13/2014   Chronic headaches    Cigarette smoker 07/11/2017   Stopped 2/022  - referred to start Lung cancer screening 04/2021         Debility, unspecified    Dermatophytosis of the body    Dysrhythmia    history of   Edema    End stage renal disease on dialysis (HCC)    "MWF; [facility] in [place]" (10/21/2014)   Essential hypertension, benign    Generalized anxiety disorder 05/25/2022   GERD (gastroesophageal reflux disease)    Gout, unspecified    HTN (hypertension)    Hypertrophy of prostate without urinary obstruction and other lower urinary tract symptoms (LUTS)    Hypotension, unspecified    Impotence of organic origin    Insomnia, unspecified    Kidney replaced by transplant    Localization-related (focal) (partial) epilepsy and epileptic syndromes with complex partial seizures, without mention of intractable epilepsy    12-15-19- Wife states he has NEVER had a seizure    Lumbago    Memory loss    OSA on CPAP    Other and unspecified  hyperlipidemia    controlled /managed per wife    Other chronic nonalcoholic liver disease    Other malaise and fatigue    Other nonspecific abnormal serum enzyme levels    Pain in joint, lower leg    Pain in joint, upper arm    Pneumonia "several times"   PONV (postoperative nausea and vomiting)    Renal dialysis status(V45.11) 02/05/2010   restarted 01/02/13 ofter renal trransplant failure   Secondary hyperparathyroidism (of renal origin)    Shortness of breath    Sleep apnea    wears cpap    Tension headache    Unspecified constipation    Unspecified essential hypertension    Unspecified hereditary and idiopathic peripheral neuropathy    Unspecified vitamin D deficiency     Tobacco History: Social History   Tobacco Use  Smoking Status Former   Current packs/day: 0.00   Average packs/day: 0.5 packs/day for 32.0 years (16.0 ttl pk-yrs)   Types: Cigarettes   Start date: 10   Quit date: 02/24/2020   Years since quitting: 2.8  Smokeless Tobacco Never   Counseling given: Not Answered   Outpatient Medications Prior to Visit  Medication Sig Dispense Refill   acetaminophen (TYLENOL) 500 MG tablet Take 1 tablet (500 mg total) by mouth every 6 (  six) hours as needed for moderate pain.     albuterol (PROVENTIL) (2.5 MG/3ML) 0.083% nebulizer solution Take 3 mLs (2.5 mg total) by nebulization every 6 (six) hours as needed for wheezing or shortness of breath. 75 mL 1   albuterol (VENTOLIN HFA) 108 (90 Base) MCG/ACT inhaler Inhale 2 puffs into the lungs every 6 (six) hours as needed for wheezing or shortness of breath. 8 g 6   amLODipine (NORVASC) 10 MG tablet TAKE 1 TABLET BY MOUTH EVERY DAY 90 tablet 3   aspirin 81 MG chewable tablet Chew 81 mg by mouth daily.     calcitRIOL (ROCALTROL) 0.5 MCG capsule Take 2 capsules (1 mcg total) by mouth every Monday, Wednesday, and Friday with hemodialysis. 60 capsule 1   Cholecalciferol (VITAMIN D3) 25 MCG (1000 UT) CAPS Take 4,000 Units by  mouth daily.     cinacalcet (SENSIPAR) 30 MG tablet Take 2 tablets (60 mg total) by mouth 3 (three) times a week. 60 tablet    diphenhydrAMINE (BENADRYL) 25 mg capsule Take 25 mg by mouth every 6 (six) hours as needed for allergies.     doxazosin (CARDURA) 8 MG tablet Take 1 tablet (8 mg total) by mouth daily. 90 tablet 3   ethyl chloride spray SMARTSIG:1 Spray(s) Topical 3 Times a Week     fluticasone-salmeterol (ADVAIR HFA) 115-21 MCG/ACT inhaler Inhale 2 puffs into the lungs 2 (two) times daily. 1 each 12   FOSRENOL 1000 MG PACK Take 1 packet by mouth 3 (three) times daily.     guaiFENesin-dextromethorphan (ROBITUSSIN DM) 100-10 MG/5ML syrup Take 5 mLs by mouth every 4 (four) hours as needed for cough. 118 mL 0   hydrALAZINE (APRESOLINE) 100 MG tablet Take 1 tablet (100 mg total) by mouth 3 (three) times daily. 270 tablet 3   isosorbide mononitrate (IMDUR) 120 MG 24 hr tablet Take 1 tablet (120 mg total) by mouth daily. 90 tablet 3   metoprolol tartrate (LOPRESSOR) 100 MG tablet TAKE 1 TABLET BY MOUTH TWICE A DAY 180 tablet 1   multivitamin (RENA-VIT) TABS tablet Take 1 tablet by mouth daily.  0   ondansetron (ZOFRAN-ODT) 4 MG disintegrating tablet TAKE 1 TABLET BY MOUTH EVERY 8 HOURS AS NEEDED FOR NAUSEA AND VOMITING 30 tablet 3   pantoprazole (PROTONIX) 40 MG tablet Take 1 tablet (40 mg total) by mouth 2 (two) times daily before a meal. 60 tablet 3   pravastatin (PRAVACHOL) 40 MG tablet TAKE 1 TABLET BY MOUTH EVERY DAY IN THE EVENING 90 tablet 2   sevelamer carbonate (RENVELA) 800 MG tablet Take 2 tablets (1,600 mg total) by mouth as needed (snacks).     sucralfate (CARAFATE) 1 GM/10ML suspension Take 10 mLs (1 g total) by mouth 4 (four) times daily -  with meals and at bedtime. 420 mL 0   traZODone (DESYREL) 50 MG tablet Take 0.5-1 tablets (25-50 mg total) by mouth at bedtime as needed. for sleep 90 tablet 1   Facility-Administered Medications Prior to Visit  Medication Dose Route Frequency  Provider Last Rate Last Admin   sodium chloride flush (NS) 0.9 % injection 10 mL  10 mL Intravenous Q12H Ralene Muskrat, PA-C          Review of Systems  Review of Systems   Physical Exam  There were no vitals taken for this visit. Physical Exam   Lab Results:  CBC    Component Value Date/Time   WBC 3.4 (L) 10/25/2022 0606   RBC 3.49 (  L) 10/25/2022 0606   HGB 11.1 (L) 10/25/2022 0606   HGB 12.2 (L) 07/24/2022 1432   HCT 34.7 (L) 10/25/2022 0606   PLT 81 (L) 10/25/2022 0606   PLT 114 (L) 07/24/2022 1432   MCV 99.4 10/25/2022 0606   MCH 31.8 10/25/2022 0606   MCHC 32.0 10/25/2022 0606   RDW 15.1 10/25/2022 0606   RDW 16.7 (H) 11/26/2013 0914   LYMPHSABS 0.4 (L) 10/25/2022 0606   LYMPHSABS 1.3 11/26/2013 0914   MONOABS 0.1 10/25/2022 0606   EOSABS 0.0 10/25/2022 0606   EOSABS 0.2 11/26/2013 0914   BASOSABS 0.0 10/25/2022 0606   BASOSABS 0.1 11/26/2013 0914    BMET    Component Value Date/Time   NA 137 12/13/2022 0958   NA 136 11/26/2013 0914   K 4.9 12/13/2022 0958   CL 103 12/13/2022 0958   CO2 19 (L) 12/13/2022 0958   GLUCOSE 109 (H) 12/13/2022 0958   BUN 72 (H) 12/13/2022 0958   BUN 66 (A) 05/25/2015 0000   BUN 66 (A) 05/25/2015 0000   CREATININE 15.62 (H) 12/13/2022 0958   CREATININE 9.87 (HH) 07/24/2022 1432   CREATININE 8.37 (H) 02/03/2018 1523   CALCIUM 8.7 (L) 12/13/2022 0958   CALCIUM 8.8 06/08/2015 0000   CALCIUM 7.9 (L) 01/19/2010 1016   GFRNONAA 3 (L) 12/13/2022 0958   GFRNONAA 6 (L) 07/24/2022 1432   GFRAA 4 (L) 07/15/2019 0704    BNP    Component Value Date/Time   BNP 1,139.8 (H) 12/02/2018 2031    ProBNP    Component Value Date/Time   PROBNP 725.4 (H) 08/04/2011 0520    Imaging: IR AV DIALY SHUNT INTRO NEEDLE/INTRACATH INITIAL W/PTA/IMG LEFT Result Date: 12/13/2022 INDICATION: 62 year old with end-stage renal disease and left forearm AV fistula. Unable to dialyze on 12/12/2022. Pulling clots and difficult cannulation. EXAM:  1. Left upper extremity fistulogram 2. Balloon angioplasty of outflow vein 3. Ultrasound guidance for vascular access MEDICATIONS: Hydralazine 10 mg ANESTHESIA/SEDATION: Moderate (conscious) sedation was employed during this procedure. A total of Versed 6 mg and Fentanyl 100 mcg was administered intravenously by the radiology nurse. Total intra-service moderate Sedation Time: 33 minutes. The patient's level of consciousness and vital signs were monitored continuously by radiology nursing throughout the procedure under my direct supervision. FLUOROSCOPY: Radiation Exposure Index (as provided by the fluoroscopic device): 19 mGy Kerma CONTRAST:  55 mL Omnipaque 300 COMPLICATIONS: None immediate. PROCEDURE: Informed written consent was obtained from the patient after a thorough discussion of the procedural risks, benefits and alternatives. All questions were addressed. A timeout was performed prior to the initiation of the procedure. Left forearm was prepped and draped in sterile fashion. Maximal barrier sterile technique was utilized including caps, mask, sterile gowns, sterile gloves, sterile drape, hand hygiene and skin antiseptic. Ultrasound confirmed a patent radiocephalic fistula and ultrasound image was saved for documentation. The distal forearm was anesthetized using 1% lidocaine. Using ultrasound guidance, 21 gauge needle was directed into the outflow vein in the distal forearm. Micropuncture dilator set was placed. Fistulogram images were obtained. Micropuncture catheter was exchanged for a 6 [place] vascular sheath over a superstiff Amplatz wire. Kumpe catheter and Bentson wire were advanced into the upper arm outflow vein. The area of severe stenosis in the proximal forearm was angioplastied with a 6 mm x 40 mm Conquest balloon. Follow-up fistulogram images were obtained. The stenosis was treated a second time with an 8 mm x 40 mm Conquest balloon. Follow-up fistulogram images were obtained. Vascular sheath  was removed using a pursestring suture. Bandage was placed. FINDINGS: Left radiocephalic fistula is patent. 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 Short segment high-grade stenosis (greater than 80%) in this aneurysmal segment. In addition, there is a large competing vein in the mid forearm with retrograde flow into the distal forearm and wrist. Outflow veins in the left upper arm are patent and the dominant outflow vessel appears to be the left basilic vein. Central veins are patent. Arterial anastomosis is patent. Small irregular arteries in the distal radial artery near the wrist are poorly characterized on this examination but there could be a small aneurysm formation in this area. The area of severe venous stenosis was balloon angioplastied with 6 mm and 8 mm balloons. Markedly improved flow through the area of stenosis following the procedure. Mild residual narrowing in the angioplastied vein compared to the adjacent aneurysmal segments. IMPRESSION: 1. 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 This area was successfully treated with balloon angioplasty. Markedly improved flow following balloon angioplasty. 2. Large competing vein in the mid forearm. 3. Question small aneurysm formation involving the left radial artery distal to the AV fistula and near the wrist. ACCESS: This access remains amenable to future percutaneous interventions as clinically indicated. Electronically Signed   By: Richarda Overlie M.D.   On: 12/13/2022 14:15   IR US Guide Vasc Access Left Result Date: 12/13/2022 INDICATION: 62 year old with end-stage renal disease and left forearm AV fistula. Unable to dialyze on 12/12/2022. Pulling clots and difficult cannulation. EXAM: 1. Left upper extremity fistulogram 2. Balloon angioplasty of outflow vein 3. Ultrasound guidance for vascular access MEDICATIONS: Hydralazine 10 mg ANESTHESIA/SEDATION: Moderate (conscious)  sedation was employed during this procedure. A total of Versed 6 mg and Fentanyl 100 mcg was administered intravenously by the radiology nurse. Total intra-service moderate Sedation Time: 33 minutes. The patient's level of consciousness and vital signs were monitored continuously by radiology nursing throughout the procedure under my direct supervision. FLUOROSCOPY: Radiation Exposure Index (as provided by the fluoroscopic device): 19 mGy Kerma CONTRAST:  55 mL Omnipaque 300 COMPLICATIONS: None immediate. PROCEDURE: Informed written consent was obtained from the patient after a thorough discussion of the procedural risks, benefits and alternatives. All questions were addressed. A timeout was performed prior to the initiation of the procedure. Left forearm was prepped and draped in sterile fashion. Maximal barrier sterile technique was utilized including caps, mask, sterile gowns, sterile gloves, sterile drape, hand hygiene and skin antiseptic. Ultrasound confirmed a patent radiocephalic fistula and ultrasound image was saved for documentation. The distal forearm was anesthetized using 1% lidocaine. Using ultrasound guidance, 21 gauge needle was directed into the outflow vein in the distal forearm. Micropuncture dilator set was placed. Fistulogram images were obtained. Micropuncture catheter was exchanged for a 6 [place] vascular sheath over a superstiff Amplatz wire. Kumpe catheter and Bentson wire were advanced into the upper arm outflow vein. The area of severe stenosis in the proximal forearm was angioplastied with a 6 mm x 40 mm Conquest balloon. Follow-up fistulogram images were obtained. The stenosis was treated a second time with an 8 mm x 40 mm Conquest balloon. Follow-up fistulogram images were obtained. Vascular sheath was removed using a pursestring suture. Bandage was placed. FINDINGS: Left radiocephalic fistula is patent. 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  Short segment high-grade stenosis (greater than 80%) in this aneurysmal segment. In addition, there is a large competing  vein in the mid forearm with retrograde flow into the distal forearm and wrist. Outflow veins in the left upper arm are patent and the dominant outflow vessel appears to be the left basilic vein. Central veins are patent. Arterial anastomosis is patent. Small irregular arteries in the distal radial artery near the wrist are poorly characterized on this examination but there could be a small aneurysm formation in this area. The area of severe venous stenosis was balloon angioplastied with 6 mm and 8 mm balloons. Markedly improved flow through the area of stenosis following the procedure. Mild residual narrowing in the angioplastied vein compared to the adjacent aneurysmal segments. IMPRESSION: 1. 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 This area was successfully treated with balloon angioplasty. Markedly improved flow following balloon angioplasty. 2. Large competing vein in the mid forearm. 3. Question small aneurysm formation involving the left radial artery distal to the AV fistula and near the wrist. ACCESS: This access remains amenable to future percutaneous interventions as clinically indicated. Electronically Signed   By: Richarda Overlie M.D.   On: 12/13/2022 14:15     Assessment & Plan:   No problem-specific Assessment & Plan notes found for this encounter.     Glenford Bayley, NP 01/08/2023

## 2023-01-08 NOTE — Progress Notes (Signed)
Pt. Started on bipap for sob and wob.

## 2023-01-08 NOTE — ED Provider Notes (Signed)
  Physical Exam  BP (!) 187/81   Pulse 70   Temp 99.2 F (37.3 C) (Oral)   Resp (!) 22   SpO2 100%   Physical Exam  Procedures  Procedures  ED Course / MDM    Medical Decision Making Amount and/or Complexity of Data Reviewed Labs: ordered. Radiology: ordered.  Risk OTC drugs. Prescription drug management. Decision regarding hospitalization.   Admit for bilateral lower lobe pneumonia. Suspected aspiration pneumonia and antibiotic selection accordingly. Patient to be dialyzed tomorrow morning, Dr. Dalene Seltzer spoke with Dr. Allena Katz. Dr. Julian Reil will evaluate patient at bedside for possible medicine admission.

## 2023-01-08 NOTE — ED Triage Notes (Signed)
Patient c/o SOB, has not missed dialysis but sats are 89% and he is unable to speak in complete sentences. 2L O2 applied in triage.

## 2023-01-08 NOTE — Telephone Encounter (Signed)
Patient has an echo set up fro the beginning of this year. Patient's cardiologist thinks it may be heart related. Patient's wife just wanted Clent Ridges NP to be notified.

## 2023-01-09 ENCOUNTER — Observation Stay (HOSPITAL_BASED_OUTPATIENT_CLINIC_OR_DEPARTMENT_OTHER): Payer: Medicare Other

## 2023-01-09 ENCOUNTER — Emergency Department (HOSPITAL_COMMUNITY): Payer: Medicare Other

## 2023-01-09 DIAGNOSIS — Z992 Dependence on renal dialysis: Secondary | ICD-10-CM

## 2023-01-09 DIAGNOSIS — I5023 Acute on chronic systolic (congestive) heart failure: Secondary | ICD-10-CM

## 2023-01-09 DIAGNOSIS — I428 Other cardiomyopathies: Secondary | ICD-10-CM | POA: Diagnosis present

## 2023-01-09 DIAGNOSIS — N250 Renal osteodystrophy: Secondary | ICD-10-CM | POA: Diagnosis not present

## 2023-01-09 DIAGNOSIS — Z79899 Other long term (current) drug therapy: Secondary | ICD-10-CM | POA: Diagnosis not present

## 2023-01-09 DIAGNOSIS — N400 Enlarged prostate without lower urinary tract symptoms: Secondary | ICD-10-CM | POA: Diagnosis present

## 2023-01-09 DIAGNOSIS — G4733 Obstructive sleep apnea (adult) (pediatric): Secondary | ICD-10-CM | POA: Diagnosis present

## 2023-01-09 DIAGNOSIS — Q8781 Alport syndrome: Secondary | ICD-10-CM | POA: Diagnosis not present

## 2023-01-09 DIAGNOSIS — G40209 Localization-related (focal) (partial) symptomatic epilepsy and epileptic syndromes with complex partial seizures, not intractable, without status epilepticus: Secondary | ICD-10-CM | POA: Diagnosis present

## 2023-01-09 DIAGNOSIS — D696 Thrombocytopenia, unspecified: Secondary | ICD-10-CM | POA: Diagnosis not present

## 2023-01-09 DIAGNOSIS — Z1152 Encounter for screening for COVID-19: Secondary | ICD-10-CM | POA: Diagnosis not present

## 2023-01-09 DIAGNOSIS — I161 Hypertensive emergency: Secondary | ICD-10-CM

## 2023-01-09 DIAGNOSIS — T8612 Kidney transplant failure: Secondary | ICD-10-CM | POA: Diagnosis present

## 2023-01-09 DIAGNOSIS — I120 Hypertensive chronic kidney disease with stage 5 chronic kidney disease or end stage renal disease: Secondary | ICD-10-CM | POA: Diagnosis not present

## 2023-01-09 DIAGNOSIS — J45909 Unspecified asthma, uncomplicated: Secondary | ICD-10-CM | POA: Diagnosis present

## 2023-01-09 DIAGNOSIS — G4730 Sleep apnea, unspecified: Secondary | ICD-10-CM | POA: Diagnosis present

## 2023-01-09 DIAGNOSIS — J690 Pneumonitis due to inhalation of food and vomit: Secondary | ICD-10-CM | POA: Diagnosis present

## 2023-01-09 DIAGNOSIS — R519 Headache, unspecified: Secondary | ICD-10-CM | POA: Diagnosis not present

## 2023-01-09 DIAGNOSIS — I132 Hypertensive heart and chronic kidney disease with heart failure and with stage 5 chronic kidney disease, or end stage renal disease: Secondary | ICD-10-CM | POA: Diagnosis present

## 2023-01-09 DIAGNOSIS — I5033 Acute on chronic diastolic (congestive) heart failure: Secondary | ICD-10-CM | POA: Diagnosis not present

## 2023-01-09 DIAGNOSIS — J90 Pleural effusion, not elsewhere classified: Secondary | ICD-10-CM | POA: Diagnosis present

## 2023-01-09 DIAGNOSIS — F411 Generalized anxiety disorder: Secondary | ICD-10-CM | POA: Diagnosis present

## 2023-01-09 DIAGNOSIS — I5043 Acute on chronic combined systolic (congestive) and diastolic (congestive) heart failure: Secondary | ICD-10-CM | POA: Diagnosis present

## 2023-01-09 DIAGNOSIS — J189 Pneumonia, unspecified organism: Secondary | ICD-10-CM | POA: Diagnosis not present

## 2023-01-09 DIAGNOSIS — I10 Essential (primary) hypertension: Secondary | ICD-10-CM | POA: Diagnosis not present

## 2023-01-09 DIAGNOSIS — E785 Hyperlipidemia, unspecified: Secondary | ICD-10-CM | POA: Diagnosis present

## 2023-01-09 DIAGNOSIS — J9601 Acute respiratory failure with hypoxia: Secondary | ICD-10-CM | POA: Diagnosis present

## 2023-01-09 DIAGNOSIS — D61818 Other pancytopenia: Secondary | ICD-10-CM | POA: Diagnosis present

## 2023-01-09 DIAGNOSIS — N2581 Secondary hyperparathyroidism of renal origin: Secondary | ICD-10-CM | POA: Diagnosis present

## 2023-01-09 DIAGNOSIS — J810 Acute pulmonary edema: Secondary | ICD-10-CM | POA: Diagnosis not present

## 2023-01-09 DIAGNOSIS — M898X9 Other specified disorders of bone, unspecified site: Secondary | ICD-10-CM | POA: Diagnosis present

## 2023-01-09 DIAGNOSIS — H9190 Unspecified hearing loss, unspecified ear: Secondary | ICD-10-CM | POA: Diagnosis present

## 2023-01-09 DIAGNOSIS — K219 Gastro-esophageal reflux disease without esophagitis: Secondary | ICD-10-CM | POA: Diagnosis present

## 2023-01-09 DIAGNOSIS — N186 End stage renal disease: Secondary | ICD-10-CM

## 2023-01-09 DIAGNOSIS — E8779 Other fluid overload: Secondary | ICD-10-CM | POA: Diagnosis not present

## 2023-01-09 DIAGNOSIS — D631 Anemia in chronic kidney disease: Secondary | ICD-10-CM | POA: Diagnosis present

## 2023-01-09 LAB — ECHOCARDIOGRAM COMPLETE
AR max vel: 1.49 cm2
AV Area VTI: 1.47 cm2
AV Area mean vel: 1.44 cm2
AV Mean grad: 18.8 mm[Hg]
AV Peak grad: 33.6 mm[Hg]
Ao pk vel: 2.9 m/s
Area-P 1/2: 4 cm2
Calc EF: 50.5 %
S' Lateral: 3.8 cm
Single Plane A2C EF: 52.6 %
Single Plane A4C EF: 51.5 %

## 2023-01-09 LAB — CBC
HCT: 26 % — ABNORMAL LOW (ref 39.0–52.0)
Hemoglobin: 8.4 g/dL — ABNORMAL LOW (ref 13.0–17.0)
MCH: 32.4 pg (ref 26.0–34.0)
MCHC: 32.3 g/dL (ref 30.0–36.0)
MCV: 100.4 fL — ABNORMAL HIGH (ref 80.0–100.0)
Platelets: 191 10*3/uL (ref 150–400)
RBC: 2.59 MIL/uL — ABNORMAL LOW (ref 4.22–5.81)
RDW: 14.4 % (ref 11.5–15.5)
WBC: 2.9 10*3/uL — ABNORMAL LOW (ref 4.0–10.5)
nRBC: 0 % (ref 0.0–0.2)

## 2023-01-09 LAB — BASIC METABOLIC PANEL
Anion gap: 14 (ref 5–15)
BUN: 14 mg/dL (ref 6–20)
CO2: 27 mmol/L (ref 22–32)
Calcium: 9.2 mg/dL (ref 8.9–10.3)
Chloride: 98 mmol/L (ref 98–111)
Creatinine, Ser: 5.97 mg/dL — ABNORMAL HIGH (ref 0.61–1.24)
GFR, Estimated: 10 mL/min — ABNORMAL LOW (ref >60)
Glucose, Bld: 127 mg/dL — ABNORMAL HIGH (ref 70–99)
Potassium: 3.3 mmol/L — ABNORMAL LOW (ref 3.5–5.1)
Sodium: 139 mmol/L (ref 135–145)

## 2023-01-09 LAB — PROCALCITONIN: Procalcitonin: 0.33 ng/mL

## 2023-01-09 LAB — HEPATITIS B SURFACE ANTIGEN: Hepatitis B Surface Ag: NONREACTIVE

## 2023-01-09 LAB — PHOSPHORUS: Phosphorus: 4.7 mg/dL — ABNORMAL HIGH (ref 2.5–4.6)

## 2023-01-09 LAB — MRSA NEXT GEN BY PCR, NASAL: MRSA by PCR Next Gen: NOT DETECTED

## 2023-01-09 LAB — SARS CORONAVIRUS 2 BY RT PCR: SARS Coronavirus 2 by RT PCR: NEGATIVE

## 2023-01-09 MED ORDER — DOXAZOSIN MESYLATE 8 MG PO TABS
8.0000 mg | ORAL_TABLET | Freq: Every day | ORAL | Status: DC
  Administered 2023-01-09 – 2023-01-11 (×3): 8 mg via ORAL
  Filled 2023-01-09 (×3): qty 1

## 2023-01-09 MED ORDER — AMLODIPINE BESYLATE 10 MG PO TABS
10.0000 mg | ORAL_TABLET | Freq: Every day | ORAL | Status: DC
  Administered 2023-01-09 – 2023-01-12 (×4): 10 mg via ORAL
  Filled 2023-01-09 (×4): qty 1

## 2023-01-09 MED ORDER — RENA-VITE PO TABS
1.0000 | ORAL_TABLET | Freq: Every day | ORAL | Status: DC
  Administered 2023-01-09 – 2023-01-12 (×4): 1 via ORAL
  Filled 2023-01-09 (×4): qty 1

## 2023-01-09 MED ORDER — ISOSORBIDE MONONITRATE ER 60 MG PO TB24
120.0000 mg | ORAL_TABLET | Freq: Every day | ORAL | Status: DC
  Administered 2023-01-09 – 2023-01-11 (×3): 120 mg via ORAL
  Filled 2023-01-09 (×3): qty 2

## 2023-01-09 MED ORDER — ASPIRIN 81 MG PO CHEW
81.0000 mg | CHEWABLE_TABLET | Freq: Every day | ORAL | Status: DC
  Administered 2023-01-09 – 2023-01-12 (×4): 81 mg via ORAL
  Filled 2023-01-09 (×4): qty 1

## 2023-01-09 MED ORDER — DIPHENHYDRAMINE HCL 25 MG PO CAPS
25.0000 mg | ORAL_CAPSULE | Freq: Four times a day (QID) | ORAL | Status: DC | PRN
  Administered 2023-01-09 – 2023-01-12 (×5): 25 mg via ORAL
  Filled 2023-01-09 (×5): qty 1

## 2023-01-09 MED ORDER — AZITHROMYCIN 500 MG IV SOLR
500.0000 mg | INTRAVENOUS | Status: DC
  Administered 2023-01-09 – 2023-01-11 (×3): 500 mg via INTRAVENOUS
  Filled 2023-01-09 (×3): qty 5

## 2023-01-09 MED ORDER — METOPROLOL TARTRATE 50 MG PO TABS
100.0000 mg | ORAL_TABLET | Freq: Two times a day (BID) | ORAL | Status: DC
  Administered 2023-01-09 – 2023-01-12 (×7): 100 mg via ORAL
  Filled 2023-01-09 (×3): qty 2
  Filled 2023-01-09: qty 1
  Filled 2023-01-09: qty 2
  Filled 2023-01-09: qty 1
  Filled 2023-01-09 (×2): qty 2

## 2023-01-09 MED ORDER — ONDANSETRON HCL 4 MG PO TABS
4.0000 mg | ORAL_TABLET | Freq: Four times a day (QID) | ORAL | Status: DC | PRN
  Administered 2023-01-09: 4 mg via ORAL
  Filled 2023-01-09: qty 1

## 2023-01-09 MED ORDER — ONDANSETRON HCL 4 MG/2ML IJ SOLN
4.0000 mg | Freq: Four times a day (QID) | INTRAMUSCULAR | Status: DC | PRN
  Administered 2023-01-10 – 2023-01-11 (×2): 4 mg via INTRAVENOUS
  Filled 2023-01-09 (×2): qty 2

## 2023-01-09 MED ORDER — MOMETASONE FURO-FORMOTEROL FUM 200-5 MCG/ACT IN AERO
2.0000 | INHALATION_SPRAY | Freq: Two times a day (BID) | RESPIRATORY_TRACT | Status: DC
  Administered 2023-01-09 – 2023-01-12 (×3): 2 via RESPIRATORY_TRACT
  Filled 2023-01-09: qty 8.8

## 2023-01-09 MED ORDER — ACETAMINOPHEN 650 MG RE SUPP: 650.0000 mg | Freq: Four times a day (QID) | RECTAL | Status: DC | PRN

## 2023-01-09 MED ORDER — LANTHANUM CARBONATE 500 MG PO CHEW
1000.0000 mg | CHEWABLE_TABLET | Freq: Three times a day (TID) | ORAL | Status: DC
  Administered 2023-01-09 – 2023-01-12 (×6): 1000 mg via ORAL
  Filled 2023-01-09 (×10): qty 2

## 2023-01-09 MED ORDER — TRAZODONE HCL 50 MG PO TABS: 50.0000 mg | ORAL_TABLET | Freq: Every evening | ORAL | Status: DC | PRN

## 2023-01-09 MED ORDER — PANTOPRAZOLE SODIUM 40 MG PO TBEC
40.0000 mg | DELAYED_RELEASE_TABLET | Freq: Two times a day (BID) | ORAL | Status: DC
  Administered 2023-01-09 – 2023-01-12 (×7): 40 mg via ORAL
  Filled 2023-01-09 (×7): qty 1

## 2023-01-09 MED ORDER — ANTICOAGULANT SODIUM CITRATE 4% (200MG/5ML) IV SOLN: 5.0000 mL | Status: DC | PRN

## 2023-01-09 MED ORDER — PRAVASTATIN SODIUM 40 MG PO TABS
40.0000 mg | ORAL_TABLET | Freq: Every day | ORAL | Status: DC
  Administered 2023-01-09 – 2023-01-11 (×3): 40 mg via ORAL
  Filled 2023-01-09 (×3): qty 1

## 2023-01-09 MED ORDER — HYDRALAZINE HCL 20 MG/ML IJ SOLN: 10.0000 mg | INTRAMUSCULAR | Status: DC | PRN

## 2023-01-09 MED ORDER — NITROGLYCERIN 2 % TD OINT
1.0000 [in_us] | TOPICAL_OINTMENT | Freq: Four times a day (QID) | TRANSDERMAL | Status: DC
  Administered 2023-01-09 – 2023-01-12 (×7): 1 [in_us] via TOPICAL
  Filled 2023-01-09: qty 1
  Filled 2023-01-09: qty 30

## 2023-01-09 MED ORDER — PENTAFLUOROPROP-TETRAFLUOROETH EX AERO: 1.0000 | INHALATION_SPRAY | CUTANEOUS | Status: DC | PRN

## 2023-01-09 MED ORDER — CALCITRIOL 0.5 MCG PO CAPS
1.0000 ug | ORAL_CAPSULE | ORAL | Status: DC
  Administered 2023-01-09 – 2023-01-11 (×2): 1 ug via ORAL
  Filled 2023-01-09: qty 2

## 2023-01-09 MED ORDER — LIDOCAINE HCL (PF) 1 % IJ SOLN: 5.0000 mL | INTRAMUSCULAR | Status: DC | PRN

## 2023-01-09 MED ORDER — LIDOCAINE-PRILOCAINE 2.5-2.5 % EX CREA: 1.0000 | TOPICAL_CREAM | CUTANEOUS | Status: DC | PRN

## 2023-01-09 MED ORDER — GUAIFENESIN-DM 100-10 MG/5ML PO SYRP: 5.0000 mL | ORAL_SOLUTION | ORAL | Status: DC | PRN

## 2023-01-09 MED ORDER — SODIUM CHLORIDE 0.9 % IV SOLN: 1.0000 g | INTRAVENOUS | Status: DC

## 2023-01-09 MED ORDER — SEVELAMER CARBONATE 800 MG PO TABS
1600.0000 mg | ORAL_TABLET | Freq: Three times a day (TID) | ORAL | Status: DC
  Administered 2023-01-09 – 2023-01-12 (×6): 1600 mg via ORAL
  Filled 2023-01-09 (×6): qty 2

## 2023-01-09 MED ORDER — SODIUM CHLORIDE 0.9 % IV SOLN
2.0000 g | INTRAVENOUS | Status: DC
  Administered 2023-01-09 – 2023-01-10 (×2): 2 g via INTRAVENOUS
  Filled 2023-01-09 (×2): qty 20

## 2023-01-09 MED ORDER — HEPARIN SODIUM (PORCINE) 1000 UNIT/ML DIALYSIS: 1000.0000 [IU] | INTRAMUSCULAR | Status: DC | PRN

## 2023-01-09 MED ORDER — CINACALCET HCL 30 MG PO TABS
60.0000 mg | ORAL_TABLET | ORAL | Status: DC
  Administered 2023-01-11: 60 mg via ORAL
  Filled 2023-01-09 (×2): qty 2

## 2023-01-09 MED ORDER — ALBUTEROL SULFATE (2.5 MG/3ML) 0.083% IN NEBU: 2.5000 mg | INHALATION_SOLUTION | Freq: Four times a day (QID) | RESPIRATORY_TRACT | Status: DC | PRN

## 2023-01-09 MED ORDER — ALTEPLASE 2 MG IJ SOLR: 2.0000 mg | Freq: Once | INTRAMUSCULAR | Status: DC | PRN

## 2023-01-09 MED ORDER — HEPARIN SODIUM (PORCINE) 5000 UNIT/ML IJ SOLN: 5000.0000 [IU] | Freq: Three times a day (TID) | INTRAMUSCULAR | Status: DC

## 2023-01-09 MED ORDER — ACETAMINOPHEN 325 MG PO TABS
650.0000 mg | ORAL_TABLET | Freq: Four times a day (QID) | ORAL | Status: DC | PRN
  Administered 2023-01-11 – 2023-01-12 (×2): 650 mg via ORAL
  Filled 2023-01-09 (×2): qty 2

## 2023-01-09 MED ORDER — HYDRALAZINE HCL 20 MG/ML IJ SOLN: 10.0000 mg | Freq: Once | INTRAMUSCULAR | Status: DC

## 2023-01-09 MED ORDER — HYDRALAZINE HCL 50 MG PO TABS
100.0000 mg | ORAL_TABLET | Freq: Three times a day (TID) | ORAL | Status: DC
  Administered 2023-01-09 – 2023-01-12 (×10): 100 mg via ORAL
  Filled 2023-01-09 (×10): qty 2

## 2023-01-09 MED ORDER — SODIUM CHLORIDE 0.9 % IV SOLN: 500.0000 mg | INTRAVENOUS | Status: DC

## 2023-01-09 NOTE — Telephone Encounter (Signed)
This patient is currently in the hospital which can sometimes result in medication adjustments or changes. Refill is inappropriate at this time being that his care is being managed by the hospital provider.  Once patient is discharge we will resume managing his medications.

## 2023-01-09 NOTE — Progress Notes (Signed)
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  Kirk [name] noted with a cough of clear sputum.  Kirk [name] presented to the ED noted to be in acute hypoxic respiratory failure, placed on BiPAP, noted to also be in hypertensive emergency, underwent hemodialysis with clinical improvement.  Also placed empirically on antibiotics due to concern for community-acquired pneumonia.   Assessment & Plan:   Principal Problem:   Acute hypoxic respiratory failure (HCC) Active Problems:   Community acquired bilateral lower lobe pneumonia   Hypertensive emergency   OSA on CPAP   Thrombocytopenia (HCC)   ESRD on dialysis (HCC)   Essential hypertension   Pancytopenia (HCC)   Acute on chronic diastolic CHF (congestive heart failure) (HCC)  #1 acute hypoxic respiratory failure likely secondary to acute on chronic diastolic CHF exacerbation and hypertensive emergency and probable bilateral lower lobe pneumonia possibly aspiration -Kirk [name] noted to have presented with ongoing worsening shortness of breath over the past 2 to 3 weeks, noted to have persistently high BPs at home and also in the ED, noted to have elevated BNP of 1500, troponins in the 30s and flattened. -Kirk [name] noted to have required BiPAP overnight. -Concern Kirk [name]'s symptoms more likely secondary to acute CHF exacerbation in the setting of hypertensive emergency rather than pneumonia. -Chest x-ray done negative for any acute abnormalities. -CT angiogram  chest done negative for PE, airway inflammation/infection and/or aspiration greatest in the lower lobes.  Small bilateral pleural effusions noted. -SARS coronavirus 2 PCR noted to be negative. -2D echo ordered. -Kirk [name] underwent hemodialysis in the ED with clinical improvement with sats of 93% on room air early on this morning. -Continue empiric IV azithromycin, IV Rocephin. -Nephrology following.  2.  Hypertensive emergency -On mentation to the ED Kirk [name] noted to have systolic blood pressures run in the 190s to 210s despite being compliant with medications. -Concern that Kirk [name] does have a decompensated CHF with acute hypoxic respiratory failure felt likely secondary to uncontrolled hypertension. -BP improved on current regimen of Norvasc 10 mg daily, hydralazine 100 mg 3 times daily, Imdur 120 mg daily, Lopressor 100 mg twice daily, Nitropaste. -If continued improvement with blood pressure could discontinue Nitropaste in the next 24 hours.  3.  ESRD on HD -Kirk [name] seen in consultation by nephrology Kirk [name] undergoing hemodialysis. -Per renal.  4.  Community-acquired pneumonia -CT angiogram chest with concerns for bilateral lower lobe pneumonia. -COVID-19 PCR negative. -Procalcitonin at 0.33. -Continue empiric IV azithromycin, IV Rocephin.  5.  Chronic pancytopenia -Kirk [name] with a chronic thrombocytopenia, pancytopenia which has been extensively worked up in the outpatient setting with heme/oncology which included bone marrow biopsy. -Per hematology/oncology likely multifactorial secondary to multiple chronic illnesses of Alport syndrome, ESRD, contributing. -Counts improving with WBC at 2.9, hemoglobin currently at 8.4, platelet count improved at 191 today. -Kirk [name] with no overt bleeding. -Continue SCDs for DVT prophylaxis.  6.  OSA on CPAP -BiPAP.   DVT prophylaxis: SCDs Code Status: Full Family Communication: Updated Kirk [name].  No family at bedside. Disposition:  TBD  Status is: Inpatient The Kirk [name] will require care spanning > 2 midnights and should be moved to inpatient because: Severity of illness   Consultants:  Nephrology: Dr. Arlean Hopping 01/09/2023  Procedures:  CT head 01/09/2023 CT angiogram chest 01/08/2023 Chest x-ray 01/08/2023 2D echo 01/09/2023  Antimicrobials:  Anti-infectives (From admission, onward)    Start     Dose/Rate Route Frequency Ordered Stop   01/09/23 2200  cefTRIAXone (ROCEPHIN) 1 g in sodium chloride 0.9 % 100 mL IVPB  Status:  Discontinued        1 g 200 mL/hr over 30 Minutes Intravenous Every 24 hours 01/09/23 0443 01/09/23 0847   01/09/23 2200  azithromycin (ZITHROMAX) 500 mg in sodium chloride 0.9 % 250 mL IVPB  Status:  Discontinued        500 mg 250 mL/hr over 60 Minutes Intravenous Every 24 hours 01/09/23 0443 01/09/23 0740   01/09/23 2200  cefTRIAXone (ROCEPHIN) 2 g in sodium chloride 0.9 % 100 mL IVPB        2 g 200 mL/hr over 30 Minutes Intravenous Every 24 hours 01/09/23 0847     01/09/23 0740  azithromycin (ZITHROMAX) 500 mg in sodium chloride 0.9 % 250 mL IVPB        500 mg 250 mL/hr over 60 Minutes Intravenous Every 24 hours 01/09/23 0740     01/08/23 2345  azithromycin (ZITHROMAX) 500 mg in sodium chloride 0.9 % 250 mL IVPB  Status:  Discontinued        500 mg 250 mL/hr over 60 Minutes Intravenous  Once 01/08/23 2335 01/09/23 0740   01/08/23 2300  azithromycin (ZITHROMAX) tablet 500 mg  Status:  Discontinued        500 mg Oral  Once 01/08/23 2247 01/08/23 2247   01/08/23 2245  cefTRIAXone (ROCEPHIN) 1 g in sodium chloride 0.9 % 100 mL IVPB        1 g 200 mL/hr over 30 Minutes Intravenous  Once 01/08/23 2239 01/09/23 0025   01/08/23 2245  metroNIDAZOLE (FLAGYL) IVPB 500 mg        500 mg 100 mL/hr over 60 Minutes Intravenous  Once 01/08/23 2239 01/09/23 0846         Subjective: Kirk [name] laying on gurney.  States feeling significantly better after hemodialysis last night.  Kirk [name] with sats of  94 to 98% on room air.  Denies any chest pain.  Tolerated diet.  Objective: Vitals:   01/09/23 1333 01/09/23 1500 01/09/23 1800 01/09/23 1845  BP: (!) 171/71 (!) 154/63 (!) 145/81 (!) 160/76  Pulse: 86 66 66   Resp:  20 15   Temp:   97.6 F (36.4 C)   TempSrc:   Oral   SpO2:  97% 99%     Intake/Output Summary (Last 24 hours) at 01/09/2023 1850 Last data filed at 01/09/2023 7829 Gross per 24 hour  Intake 100 ml  Output 3000 ml  Net -2900 ml   There were no vitals filed for this visit.  Examination:  General exam: Appears calm and comfortable  Respiratory system: Scattered bibasilar crackles.  No wheezing.  Fair air movement.  Speaking in full sentences.  Cardiovascular system: RRR. 3/6 SEM.  No JVD, murmurs, rubs, gallops or clicks. No pedal edema. Gastrointestinal system: Abdomen is nondistended, soft and nontender. No organomegaly or masses felt. Normal bowel sounds heard. Central nervous system: Alert and oriented. No focal neurological deficits. Extremities: Symmetric 5 x 5 power. Skin: No rashes, lesions or ulcers Psychiatry: Judgement and insight appear  normal. Mood & affect appropriate.     Data Reviewed: I have personally reviewed following labs and imaging studies  CBC: Recent Labs  Lab 01/08/23 1918 01/08/23 1923 01/08/23 2127 01/09/23 0921  WBC  --  1.4* 3.8* 2.9*  NEUTROABS  --  1.0* 2.4  --   HGB 9.9* 17.1* 8.5* 8.4*  HCT 29.0* 53.9* 26.3* 26.0*  MCV  --  99.6 101.5* 100.4*  PLT  --  38* 98* 191    Basic Metabolic Panel: Recent Labs  Lab 01/08/23 1918 01/08/23 1923 01/09/23 1048  NA 141 142 139  K 4.1 4.1 3.3*  CL  --  103 98  CO2  --  24 27  GLUCOSE  --  117* 127*  BUN  --  30* 14  CREATININE  --  10.40* 5.97*  CALCIUM  --  9.0 9.2  PHOS  --   --  4.7*    GFR: Estimated Creatinine Clearance: 14.3 mL/min (A) (by C-G formula based on SCr of 5.97 mg/dL (H)).  Liver Function Tests: Recent Labs  Lab 01/08/23 1923  AST 13*  ALT 13   ALKPHOS 63  BILITOT 0.7  PROT 6.7  ALBUMIN 3.6    CBG: No results for input(s): "GLUCAP" in the last 168 hours.   Recent Results (from the past 240 hours)  SARS Coronavirus 2 by RT PCR (hospital order, performed in [facility] hospital lab) *cepheid single result test* Anterior Nasal Swab     Status: None   Collection Time: 01/09/23 12:08 AM   Specimen: Anterior Nasal Swab  Result Value Ref Range Status   SARS Coronavirus 2 by RT PCR NEGATIVE NEGATIVE Final    Comment: Performed at [facility], [address], [place], [place] [address]         Radiology Studies: ECHOCARDIOGRAM COMPLETE Result Date: 01/09/2023    ECHOCARDIOGRAM REPORT   Kirk [name] Name:   KASEN RAMMER Date of Exam: 01/09/2023 Medical Rec #:  [id number]       Height:       67.0 in Accession #:    [id number]      Weight:       206.4 lb Date of Birth:  02-21-1960        BSA:          2.049 m Kirk [name] Age:    62 years        BP:           170/51 mmHg Kirk [name] Gender: M               HR:           73 bpm. Exam Location:  Inpatient Procedure: 2D Echo, Cardiac Doppler and Color Doppler Indications:    CHF  History:        Kirk [name] has prior history of Echocardiogram examinations, most                 recent 11/16/2021. Risk Factors:Hypertension and Diabetes.  Sonographer:    Webb Laws Referring Phys: [id number] JARED M GARDNER IMPRESSIONS  1. Left ventricular ejection fraction, by estimation, is 50 to 55%. The left ventricle has low normal function. The left ventricle has no regional wall motion abnormalities. Left ventricular diastolic parameters are consistent with Grade II diastolic dysfunction (pseudonormalization).  2. Right ventricular systolic function is normal. The right ventricular size is normal.  3. The mitral valve is normal in structure. Trivial mitral valve regurgitation. No evidence of mitral stenosis.  4. The aortic valve is tricuspid. There is moderate calcification of the aortic valve. Aortic valve  regurgitation is not visualized. Moderate aortic valve stenosis.  5. The inferior vena cava is dilated in size with >50% respiratory variability, suggesting right atrial pressure of 8 mmHg. FINDINGS  Left Ventricle: Left ventricular ejection fraction, by estimation, is 50 to 55%. The left ventricle has low normal function. The left ventricle has no regional wall motion abnormalities. The left ventricular internal cavity size was normal in size. There is no left ventricular hypertrophy. Left ventricular diastolic parameters are consistent with Grade II diastolic dysfunction (pseudonormalization). Right Ventricle: The right ventricular size is normal. No increase in right ventricular wall thickness. Right ventricular systolic function is normal. Left Atrium: Left atrial size was normal in size. Right Atrium: Right atrial size was normal in size. Pericardium: There is no evidence of pericardial effusion. Mitral Valve: The mitral valve is normal in structure. Mild mitral annular calcification. Trivial mitral valve regurgitation. No evidence of mitral valve stenosis. Tricuspid Valve: The tricuspid valve is normal in structure. Tricuspid valve regurgitation is trivial. No evidence of tricuspid stenosis. Aortic Valve: The aortic valve is tricuspid. There is moderate calcification of the aortic valve. Aortic valve regurgitation is not visualized. Moderate aortic stenosis is present. Aortic valve mean gradient measures 18.8 mmHg. Aortic valve peak gradient  measures 33.6 mmHg. Aortic valve area, by VTI measures 1.47 cm. Pulmonic Valve: The pulmonic valve was normal in structure. Pulmonic valve regurgitation is trivial. No evidence of pulmonic stenosis. Aorta: The aortic root is normal in size and structure. Venous: The inferior vena cava is dilated in size with greater than 50% respiratory variability, suggesting right atrial pressure of 8 mmHg. IAS/Shunts: No atrial level shunt detected by color flow Doppler.  LEFT  VENTRICLE PLAX 2D LVIDd:         6.20 cm      Diastology LVIDs:         3.80 cm      LV e' medial:    4.57 cm/s LV PW:         1.10 cm      LV E/e' medial:  28.0 LV IVS:        1.00 cm      LV e' lateral:   5.77 cm/s LVOT diam:     2.20 cm      LV E/e' lateral: 22.2 LV SV:         92 LV SV Index:   45 LVOT Area:     3.80 cm  LV Volumes (MOD) LV vol d, MOD A2C: 172.0 ml LV vol d, MOD A4C: 156.0 ml LV vol s, MOD A2C: 81.5 ml LV vol s, MOD A4C: 75.7 ml LV SV MOD A2C:     90.5 ml LV SV MOD A4C:     156.0 ml LV SV MOD BP:      82.9 ml RIGHT VENTRICLE             IVC RV Basal diam:  4.60 cm     IVC diam: 2.20 cm RV Mid diam:    2.90 cm RV S prime:     13.20 cm/s TAPSE (M-mode): 2.8 cm LEFT ATRIUM             Index        RIGHT ATRIUM           Index LA diam:        4.10 cm 2.00 cm/m  RA Area:     18.80 cm LA Vol (A2C):   72.6 ml 35.42 ml/m  RA Volume:   53.30 ml  26.01 ml/m LA Vol (A4C):   77.4 ml 37.77 ml/m LA Biplane Vol: 78.7 ml 38.40 ml/m  AORTIC VALVE AV Area (Vmax):    1.49 cm AV Area (Vmean):   1.44 cm AV Area (VTI):     1.47 cm AV Vmax:           290.00 cm/s AV Vmean:          205.200 cm/s AV VTI:            0.630 m AV Peak Grad:      33.6 mmHg AV Mean Grad:      18.8 mmHg LVOT Vmax:         114.00 cm/s LVOT Vmean:        77.900 cm/s LVOT VTI:          0.243 m LVOT/AV VTI ratio: 0.39  AORTA Ao Root diam: 3.00 cm Ao Asc diam:  3.60 cm MITRAL VALVE                TRICUSPID VALVE MV Area (PHT): 4.00 cm     TR Peak grad:   37.2 mmHg MV E velocity: 128.00 cm/s  TR Vmax:        305.00 cm/s MV A velocity: 114.00 cm/s MV E/A ratio:  1.12         SHUNTS                             Systemic VTI:  0.24 m                             Systemic Diam: 2.20 cm Arvilla Meres MD Electronically signed by Arvilla Meres MD Signature Date/Time: 01/09/2023/12:25:24 PM    Final    CT Head Wo Contrast Result Date: 01/09/2023 CLINICAL DATA:  Headache. EXAM: CT HEAD WITHOUT CONTRAST TECHNIQUE: Contiguous axial images  were obtained from the base of the skull through the vertex without intravenous contrast. RADIATION DOSE REDUCTION: This exam was performed according to the departmental dose-optimization program which includes automated exposure control, adjustment of the mA and/or kV according to Kirk [name] size and/or use of iterative reconstruction technique. COMPARISON:  CT head 07/30/2022. FINDINGS: Brain: No evidence of acute infarction, hemorrhage, hydrocephalus, extra-axial collection or mass lesion/mass effect. Vascular: No hyperdense vessel or unexpected calcification. Skull: Normal. Negative for fracture or focal lesion. Sinuses/Orbits: No acute finding. Other: None. IMPRESSION: No acute intracranial abnormality. Electronically Signed   By: Darliss Cheney M.D.   On: 01/09/2023 01:23   CT Angio Chest PE W and/or Wo Contrast Result Date: 01/08/2023 CLINICAL DATA:  Hypoxia and shortness of breath EXAM: CT ANGIOGRAPHY CHEST WITH CONTRAST TECHNIQUE: Multidetector CT imaging of the chest was performed using the standard protocol during bolus administration of intravenous contrast. Multiplanar CT image reconstructions and MIPs were obtained to evaluate the vascular anatomy. RADIATION DOSE REDUCTION: This exam was performed according to the departmental dose-optimization program which includes automated exposure control, adjustment of the mA and/or kV according to Kirk [name] size and/or use of iterative reconstruction technique. CONTRAST:  75mL OMNIPAQUE IOHEXOL 350 MG/ML SOLN COMPARISON:  Same day chest radiograph and CT chest 05/01/2022 FINDINGS: Cardiovascular: Negative for acute pulmonary embolism. No pericardial effusion. Coronary artery and aortic atherosclerotic calcification. Mediastinum/Nodes: Trachea and  esophagus are unremarkable. Unchanged prominent right hilar and subcarinal lymph node measuring up to 1.0 cm. Lungs/Pleura: Interlobular septal thickening patchy ground-glass opacities greatest in the lower lungs.  Bronchial wall thickening with mucous plugging. Centrilobular micro nodules and abutting tree opacities in the lower lungs. Small right and trace left pleural effusions. No pneumothorax. Upper Abdomen: No acute abnormality. Musculoskeletal: No acute fracture. Review of the MIP images confirms the above findings. IMPRESSION: 1. Negative for acute pulmonary embolism. 2. Airway inflammation/infection and/or aspiration greatest in the lower lobes. Small bilateral pleural effusions. Aortic Atherosclerosis (ICD10-I70.0). Electronically Signed   By: Minerva Fester M.D.   On: 01/08/2023 22:29   DG Chest Portable 1 View Result Date: 01/08/2023 CLINICAL DATA:  Shortness of breath. EXAM: PORTABLE CHEST 1 VIEW COMPARISON:  Chest radiographs 10/25/2022 and 07/30/2022; CT chest 05/01/2022 FINDINGS: Cardiac silhouette is again mildly enlarged. Trace pericardial fluid was seen on 05/01/2022 chest CT. Chroniclower lung reticular opacities are similar to 10/25/2022. No definite acute airspace opacity. No pleural effusion pneumothorax. No significant skeletal abnormality. IMPRESSION: 1. Chronic lower lung reticular opacities are similar to 10/25/2022 radiograph and 05/01/2022 CT, likely chronic lung disease. No definite acute airspace opacity. 2. Mild cardiomegaly. Electronically Signed   By: Neita Garnet M.D.   On: 01/08/2023 20:51        Scheduled Meds:  amLODipine  10 mg Oral Daily   aspirin  81 mg Oral Daily   calcitRIOL  1 mcg Oral Q M,W,F-HD   Chlorhexidine Gluconate Cloth  6 each Topical Q0600   cinacalcet  60 mg Oral Once per day on Monday Wednesday Friday   doxazosin  8 mg Oral QHS   hydrALAZINE  10 mg Intravenous Once   hydrALAZINE  100 mg Oral TID   isosorbide mononitrate  120 mg Oral QHS   lanthanum  1,000 mg Oral TID with meals   metoprolol tartrate  100 mg Oral BID   mometasone-formoterol  2 puff Inhalation BID   multivitamin  1 tablet Oral Daily   nitroGLYCERIN  1 inch Topical Q6H    pantoprazole  40 mg Oral BID AC   pravastatin  40 mg Oral q1800   sevelamer carbonate  1,600 mg Oral TID with meals   Continuous Infusions:  azithromycin Stopped (01/09/23 0845)   cefTRIAXone (ROCEPHIN)  IV       LOS: 0 days    Time spent: 40 minutes    Ramiro Harvest, MD [facility]   To contact the attending provider between 7A-7P or the covering provider during after hours 7P-7A, please log into the web site [email or web address] and access using universal [place] password for that web site. If you do not have the password, please call the hospital operator.  01/09/2023, 6:50 PM

## 2023-01-09 NOTE — H&P (Signed)
History and Physical    Patient: Kirk [name]:[id number] DOB: 25-Aug-1960 DOA: 01/08/2023 DOS: the patient was seen and examined on 01/09/2023 PCP: Ngetich, Donalee Citrin, NP  Patient coming from: Home  Chief Complaint:  Chief Complaint  Patient presents with   Shortness of Breath   HPI: Kirk [name] is a 62 y.o. 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  Pt  in to ED with c/o 2-3 weeks of worsening SOB.  Going to dialysis as instructed.  Getting dialyzed to below dry weight but symptoms continue to progressively worsen.  Cough productive of clear sputum.  No fevers, chills.  Doesn't make any urine.  No peripheral edema but hasn't ever had peripheral edema his wife reports, even with CHF admits in past.  Review of Systems: As mentioned in the history of present illness. All other systems reviewed and are negative. 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)    Anxiety    Asthma    Asthma    moderate persistent   Carpal tunnel syndrome    Cellulitis and abscess of trunk    Cholelithiasis 07/13/2014   Chronic headaches    Cigarette smoker 07/11/2017   Stopped 2/022  - referred to start Lung cancer screening 04/2021         Debility, unspecified    Dermatophytosis of the body    Dysrhythmia    history of   Edema    End stage renal disease on dialysis [facility])    "MWF; [facility] in [place]" (10/21/2014)   Essential hypertension, benign    Generalized anxiety disorder 05/25/2022   GERD (gastroesophageal reflux disease)    Gout, unspecified    HTN (hypertension)    Hypertrophy of prostate without urinary obstruction and other lower urinary tract symptoms (LUTS)    Hypotension, unspecified    Impotence of  organic origin    Insomnia, unspecified    Kidney replaced by transplant    Localization-related (focal) (partial) epilepsy and epileptic syndromes with complex partial seizures, without mention of intractable epilepsy    12-15-19- Wife states he has NEVER had a seizure    Lumbago    Memory loss    OSA on CPAP    Other and unspecified hyperlipidemia    controlled /managed per wife    Other chronic nonalcoholic liver disease    Other malaise and fatigue    Other nonspecific abnormal serum enzyme levels    Pain in joint, lower leg    Pain in joint, upper arm    Pneumonia "several times"   PONV (postoperative nausea and vomiting)    Renal dialysis status(V45.11) 02/05/2010   restarted 01/02/13 ofter renal trransplant failure   Secondary hyperparathyroidism (of renal origin)    Shortness of breath    Sleep apnea    wears cpap    Tension headache    Unspecified constipation    Unspecified essential hypertension    Unspecified hereditary and idiopathic peripheral neuropathy    Unspecified vitamin D deficiency    Past Surgical History:  Procedure Laterality Date   AV FISTULA PLACEMENT Left ?2010   "forearm; at [facility]"   BACK SURGERY     CARDIAC CATHETERIZATION  03/21/2011  CHOLECYSTECTOMY N/A 10/21/2014   Procedure: LAPAROSCOPIC CHOLECYSTECTOMY WITH INTRAOPERATIVE CHOLANGIOGRAM;  Surgeon: Chevis Pretty III, MD;  Location: [facility] OR;  Service: General;  Laterality: N/A;   COLONOSCOPY     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   IR ANGIO INTRA EXTRACRAN SEL INTERNAL CAROTID UNI R MOD SED  11/30/2021   IR ANGIOGRAM FOLLOW UP STUDY  11/30/2021   IR AV DIALY SHUNT INTRO NEEDLE/INTRACATH INITIAL W/PTA/IMG LEFT  12/13/2022   IR BONE MARROW BIOPSY & ASPIRATION  03/27/2022   IR NEURO EACH ADD'L AFTER BASIC UNI RIGHT (MS)  11/30/2021   IR TRANSCATH/EMBOLIZ  11/30/2021   IR US GUIDE VASC ACCESS LEFT  12/13/2022   KIDNEY TRANSPLANT  08/17/2011   [facility]    LAPAROSCOPIC  CHOLECYSTECTOMY  10/21/2014   w/IOC   LEFT HEART CATHETERIZATION WITH CORONARY ANGIOGRAM N/A 03/21/2011   Procedure: LEFT HEART CATHETERIZATION WITH CORONARY ANGIOGRAM;  Surgeon: Chrystie Nose, MD;  Location: [facility] CATH LAB;  Service: Cardiovascular;  Laterality: N/A;   NEPHRECTOMY  08/2013   removed transplaned kidney   POLYPECTOMY     POSTERIOR FUSION CERVICAL SPINE  06/25/2012   for spinal stenosis   RADIOLOGY WITH ANESTHESIA N/A 11/30/2021   Procedure: RIGHT MIDDLE MENINGEAL ARTERY EMBOLIZATION;  Surgeon: Radiologist, Medication, MD;  Location: [facility] OR;  Service: Radiology;  Laterality: N/A;   VASECTOMY  2010   Social History:  reports that he quit smoking about 2 years ago. His smoking use included cigarettes. He started smoking about 31 years ago. He has a 16 pack-year smoking history. He has never used smokeless tobacco. He reports that he does not drink alcohol and does not use drugs.  Allergies  Allergen Reactions   Codeine Nausea And Vomiting   Hydrocodone-Acetaminophen Nausea And Vomiting    Family History  Adopted: Yes  Problem Relation Age of Onset   Colon cancer Neg Hx    Esophageal cancer Neg Hx    Rectal cancer Neg Hx    Stomach cancer Neg Hx    Colon polyps Neg Hx     Prior to Admission medications   Medication Sig Start Date End Date Taking? Authorizing Provider  acetaminophen (TYLENOL) 500 MG tablet Take 1 tablet (500 mg total) by mouth every 6 (six) hours as needed for moderate pain. 09/12/14  Yes Standley Brooking, MD  albuterol (PROVENTIL) (2.5 MG/3ML) 0.083% nebulizer solution Take 3 mLs (2.5 mg total) by nebulization every 6 (six) hours as needed for wheezing or shortness of breath. 11/13/22  Yes Glenford Bayley, NP  albuterol (VENTOLIN HFA) 108 (90 Base) MCG/ACT inhaler Inhale 2 puffs into the lungs every 6 (six) hours as needed for wheezing or shortness of breath. 10/29/22  Yes Nyoka Cowden, MD  amLODipine (NORVASC) 10 MG tablet TAKE 1 TABLET BY MOUTH EVERY  DAY 12/05/22  Yes Ngetich, Dinah C, NP  aspirin 81 MG chewable tablet Chew 81 mg by mouth daily. 09/28/20  Yes [provider]  calcitRIOL (ROCALTROL) 0.5 MCG capsule Take 2 capsules (1 mcg total) by mouth every Monday, Wednesday, and Friday with hemodialysis. 06/04/18  Yes Black, Lesle Chris, NP  Cholecalciferol (VITAMIN D3) 25 MCG (1000 UT) CAPS Take 4,000 Units by mouth daily. Take at home on Tues, Thurs, Sat, and Sun   Yes [provider]  cinacalcet (SENSIPAR) 30 MG tablet Take 2 tablets (60 mg total) by mouth 3 (three) times a week. 11/29/21  Yes Angiulli, Mcarthur Rossetti, PA-C  diphenhydrAMINE (BENADRYL)  25 mg capsule Take 25 mg by mouth every 6 (six) hours as needed for allergies.   Yes [provider]  doxazosin (CARDURA) 8 MG tablet Take 1 tablet (8 mg total) by mouth daily. Patient taking differently: Take 8 mg by mouth at bedtime. 04/24/22  Yes Alver Sorrow, NP  fluticasone-salmeterol (ADVAIR HFA) 130-86 MCG/ACT inhaler Inhale 2 puffs into the lungs 2 (two) times daily. 11/01/22  Yes Nyoka Cowden, MD  FOSRENOL 1000 MG PACK Take 1 packet by mouth with breakfast, with lunch, and with evening meal. Also take when eating snacks 02/26/22  Yes [provider]  guaiFENesin-dextromethorphan (ROBITUSSIN DM) 100-10 MG/5ML syrup Take 5 mLs by mouth every 4 (four) hours as needed for cough. 09/18/22  Yes Glenford Bayley, NP  hydrALAZINE (APRESOLINE) 100 MG tablet Take 1 tablet (100 mg total) by mouth 3 (three) times daily. 05/22/22  Yes Ngetich, Dinah C, NP  isosorbide mononitrate (IMDUR) 120 MG 24 hr tablet Take 1 tablet (120 mg total) by mouth daily. Patient taking differently: Take 120 mg by mouth at bedtime. 06/28/22  Yes Alver Sorrow, NP  metoprolol tartrate (LOPRESSOR) 100 MG tablet TAKE 1 TABLET BY MOUTH TWICE A DAY 09/05/22  Yes Ngetich, Dinah C, NP  multivitamin (RENA-VIT) TABS tablet Take 1 tablet by mouth daily. 11/29/21  Yes Angiulli, Mcarthur Rossetti, PA-C   pantoprazole (PROTONIX) 40 MG tablet Take 1 tablet (40 mg total) by mouth 2 (two) times daily before a meal. 10/02/22  Yes Ngetich, Dinah C, NP  pravastatin (PRAVACHOL) 40 MG tablet TAKE 1 TABLET BY MOUTH EVERY DAY IN THE EVENING 12/04/22  Yes Ngetich, Dinah C, NP  sevelamer carbonate (RENVELA) 800 MG tablet Take 2 tablets (1,600 mg total) by mouth as needed (snacks). Patient taking differently: Take 1,600 mg by mouth 4 (four) times daily. Take with a meal and snacks 11/29/21  Yes Angiulli, Mcarthur Rossetti, PA-C  traZODone (DESYREL) 50 MG tablet Take 0.5-1 tablets (25-50 mg total) by mouth at bedtime as needed. for sleep Patient taking differently: Take 50 mg by mouth at bedtime as needed for sleep. for sleep 05/22/22  Yes Ngetich, Dinah C, NP  amoxicillin (AMOXIL) 500 MG tablet Take 500 mg by mouth 2 (two) times daily. 01/07/23   [provider]  ondansetron (ZOFRAN-ODT) 4 MG disintegrating tablet TAKE 1 TABLET BY MOUTH EVERY 8 HOURS AS NEEDED FOR NAUSEA AND VOMITING 10/02/22   Ngetich, Donalee Citrin, NP    Physical Exam: Vitals:   01/09/23 0254 01/09/23 0324 01/09/23 0354 01/09/23 0424  BP: (!) 188/75 (!) 172/100 (!) 182/63 (!) 182/73  Pulse: 71 74 67   Resp: 19 17 20 20   Temp:      TempSrc:      SpO2: 100% 100% 100% 100%   Constitutional: NAD, On BIPAP Respiratory: Crackles throughout both lungs. Cardiovascular: Regular rate and rhythm, no murmurs / rubs / gallops. Trace extremity edema. 2+ pedal pulses. No carotid bruits.  Abdomen: no tenderness, no masses palpated. No hepatosplenomegaly. Bowel sounds positive.  Psychiatric: Normal judgment and insight. Alert and oriented x 3. Normal mood.   Data Reviewed:    Labs on Admission: I have personally reviewed following labs and imaging studies  CBC: Recent Labs  Lab 01/08/23 1918 01/08/23 1923 01/08/23 2127  WBC  --  1.4* 3.8*  NEUTROABS  --  1.0* 2.4  HGB 9.9* 17.1* 8.5*  HCT 29.0* 53.9* 26.3*  MCV  --  99.6 101.5*  PLT  --  38*  98*   Basic Metabolic Panel: Recent Labs  Lab 01/08/23 1918 01/08/23 1923  NA 141 142  K 4.1 4.1  CL  --  103  CO2  --  24  GLUCOSE  --  117*  BUN  --  30*  CREATININE  --  10.40*  CALCIUM  --  9.0   GFR: Estimated Creatinine Clearance: 8.2 mL/min (A) (by C-G formula based on SCr of 10.4 mg/dL (H)). Liver Function Tests: Recent Labs  Lab 01/08/23 1923  AST 13*  ALT 13  ALKPHOS 63  BILITOT 0.7  PROT 6.7  ALBUMIN 3.6   No results for input(s): "LIPASE", "AMYLASE" in the last 168 hours. No results for input(s): "AMMONIA" in the last 168 hours. Coagulation Profile: No results for input(s): "INR", "PROTIME" in the last 168 hours. Cardiac Enzymes: No results for input(s): "CKTOTAL", "CKMB", "CKMBINDEX", "TROPONINI" in the last 168 hours. BNP (last 3 results) No results for input(s): "PROBNP" in the last 8760 hours. HbA1C: No results for input(s): "HGBA1C" in the last 72 hours. CBG: No results for input(s): "GLUCAP" in the last 168 hours. Lipid Profile: No results for input(s): "CHOL", "HDL", "LDLCALC", "TRIG", "CHOLHDL", "LDLDIRECT" in the last 72 hours. Thyroid Function Tests: No results for input(s): "TSH", "T4TOTAL", "FREET4", "T3FREE", "THYROIDAB" in the last 72 hours. Anemia Panel: No results for input(s): "VITAMINB12", "FOLATE", "FERRITIN", "TIBC", "IRON", "RETICCTPCT" in the last 72 hours. Urine analysis:    Component Value Date/Time   COLORURINE YELLOW 02/19/2017 1520   APPEARANCEUR HAZY (A) 02/19/2017 1520   LABSPEC 1.012 02/19/2017 1520   PHURINE 8.0 02/19/2017 1520   GLUCOSEU 50 (A) 02/19/2017 1520   HGBUR MODERATE (A) 02/19/2017 1520   BILIRUBINUR NEGATIVE 02/19/2017 1520   KETONESUR NEGATIVE 02/19/2017 1520   PROTEINUR >=300 (A) 02/19/2017 1520   UROBILINOGEN 0.2 04/06/2014 1030   NITRITE NEGATIVE 02/19/2017 1520   LEUKOCYTESUR MODERATE (A) 02/19/2017 1520    Radiological Exams on Admission: CT Head Wo Contrast Result Date: 01/09/2023 CLINICAL  DATA:  Headache. EXAM: CT HEAD WITHOUT CONTRAST TECHNIQUE: Contiguous axial images were obtained from the base of the skull through the vertex without intravenous contrast. RADIATION DOSE REDUCTION: This exam was performed according to the departmental dose-optimization program which includes automated exposure control, adjustment of the mA and/or kV according to patient size and/or use of iterative reconstruction technique. COMPARISON:  CT head 07/30/2022. FINDINGS: Brain: No evidence of acute infarction, hemorrhage, hydrocephalus, extra-axial collection or mass lesion/mass effect. Vascular: No hyperdense vessel or unexpected calcification. Skull: Normal. Negative for fracture or focal lesion. Sinuses/Orbits: No acute finding. Other: None. IMPRESSION: No acute intracranial abnormality. Electronically Signed   By: Darliss Cheney M.D.   On: 01/09/2023 01:23   CT Angio Chest PE W and/or Wo Contrast Result Date: 01/08/2023 CLINICAL DATA:  Hypoxia and shortness of breath EXAM: CT ANGIOGRAPHY CHEST WITH CONTRAST TECHNIQUE: Multidetector CT imaging of the chest was performed using the standard protocol during bolus administration of intravenous contrast. Multiplanar CT image reconstructions and MIPs were obtained to evaluate the vascular anatomy. RADIATION DOSE REDUCTION: This exam was performed according to the departmental dose-optimization program which includes automated exposure control, adjustment of the mA and/or kV according to patient size and/or use of iterative reconstruction technique. CONTRAST:  75mL OMNIPAQUE IOHEXOL 350 MG/ML SOLN COMPARISON:  Same day chest radiograph and CT chest 05/01/2022 FINDINGS: Cardiovascular: Negative for acute pulmonary embolism. No pericardial effusion. Coronary artery and aortic atherosclerotic calcification. Mediastinum/Nodes: Trachea and esophagus are unremarkable. Unchanged prominent  right hilar and subcarinal lymph node measuring up to 1.0 cm. Lungs/Pleura: Interlobular  septal thickening patchy ground-glass opacities greatest in the lower lungs. Bronchial wall thickening with mucous plugging. Centrilobular micro nodules and abutting tree opacities in the lower lungs. Small right and trace left pleural effusions. No pneumothorax. Upper Abdomen: No acute abnormality. Musculoskeletal: No acute fracture. Review of the MIP images confirms the above findings. IMPRESSION: 1. Negative for acute pulmonary embolism. 2. Airway inflammation/infection and/or aspiration greatest in the lower lobes. Small bilateral pleural effusions. Aortic Atherosclerosis (ICD10-I70.0). Electronically Signed   By: Minerva Fester M.D.   On: 01/08/2023 22:29   DG Chest Portable 1 View Result Date: 01/08/2023 CLINICAL DATA:  Shortness of breath. EXAM: PORTABLE CHEST 1 VIEW COMPARISON:  Chest radiographs 10/25/2022 and 07/30/2022; CT chest 05/01/2022 FINDINGS: Cardiac silhouette is again mildly enlarged. Trace pericardial fluid was seen on 05/01/2022 chest CT. Chroniclower lung reticular opacities are similar to 10/25/2022. No definite acute airspace opacity. No pleural effusion pneumothorax. No significant skeletal abnormality. IMPRESSION: 1. Chronic lower lung reticular opacities are similar to 10/25/2022 radiograph and 05/01/2022 CT, likely chronic lung disease. No definite acute airspace opacity. 2. Mild cardiomegaly. Electronically Signed   By: Neita Garnet M.D.   On: 01/08/2023 20:51    EKG: Independently reviewed.   Assessment and Plan: * Acute hypoxic respiratory failure (HCC) DDx includes acute CHF due to volume overload / hypertensive emergency, vs BLL PNA, possibly aspiration. Given multiple week lead up with persistently high and higher blood pressures at home and now in ED as reported by family, as well as BNP of 1500, trops in the 30s and flat (last time he had BPs, BNP, and trop that looked very similar to this was CHF admit in Aug 2020), as well as no reported fevers.  Im inclined to  be more suspicious of CHF / HTN emergency than PNA. Regardless, treating as both CHF / HTN emergency AND BLL PNA for the moment Rescue BIPAP  Hypertensive emergency Despite reported compliance with BP meds: BP running quite high (190s-210s systolic) in ED today. Question If pt has decompensated CHF and acute hypoxic resp failure due to uncontrolled HTN? Question if pt dry weight may need to be adjusted down and he is volume overloaded? NTG paste Hydralazine PRN Resume all home BP meds Due to acute hypoxic resp failure requiring BIPAP, pt going for urgent dialysis. 2d echo ordered (looks like Dr. Rennis Golden had ordered this for 1/16, but dont really want to wait with admission today)  Community acquired bilateral lower lobe pneumonia Question of BLL PNA on CT scan. Check COVID-19 Check RVP Started on rocephin + azithro in ED, will go ahead and continue this for the moment Procalcitonin ordered and pending  Essential hypertension See HTN urgency above  ESRD on dialysis [facility]) ESRD due to Alports syndrome. Pt currently getting dialized for HTN urgency, concern for volume overload.  Thrombocytopenia (HCC) Pt with chronic thrombocytopenia, pancytopenia actually. Looks like this has been fairly extensively worked up by [employer], havent put a specific name to it other than "pancytopenia" but work up has included bone marrow biopsy.  Looks like they feel multiple chronic illnesses (Alports syndrome, ESRD, etc) contributing.  See Dr. Earmon Phoenix note from 7/2 for details. Todays values: WBC 3.8k and platelets of 98, look c/w his chronic baseline. Use SCDs for DVT ppx Continue to monitor during hospital stay.  OSA on CPAP On rescue BIPAP for the moment.      Advance Care Planning:  Code Status: Full Code  Consults: EDP d/w Dr. Allena Katz, pt to dialysis.  Family Communication: Family at bedside  Severity of Illness: The appropriate patient status for this patient is OBSERVATION. Observation  status is judged to be reasonable and necessary in order to provide the required intensity of service to ensure the patient's safety. The patient's presenting symptoms, physical exam findings, and initial radiographic and laboratory data in the context of their medical condition is felt to place them at decreased risk for further clinical deterioration. Furthermore, it is anticipated that the patient will be medically stable for discharge from the hospital within 2 midnights of admission.   Author: Hillary Bow., DO 01/09/2023 4:45 AM  For on call review [email or web address].

## 2023-01-09 NOTE — Assessment & Plan Note (Addendum)
Pt with chronic thrombocytopenia, pancytopenia actually. Looks like this has been fairly extensively worked up by [employer], havent put a specific name to it other than "pancytopenia" but work up has included bone marrow biopsy.  Looks like they feel multiple chronic illnesses (Alports syndrome, ESRD, etc) contributing.  See Dr. Earmon Phoenix note from 7/2 for details. Todays values: WBC 3.8k and platelets of 98, look c/w his chronic baseline. Use SCDs for DVT ppx Continue to monitor during hospital stay.

## 2023-01-09 NOTE — Progress Notes (Signed)
Received patient in bed to unit.  Alert and oriented.  Informed consent signed and in chart.   TX duration: 3:45  Patient tolerated well.  Transported back to the room  Alert, without acute distress.  Hand-off given to patient's nurse.   Access used: LUE AVF Access issues: None  Total UF removed: 3000 mL Medication(s) given: None Post HD VS: please see data insert    01/09/23 0634  Vitals  Temp 98.3 F (36.8 C)  Temp Source Axillary  BP (!) 175/76  MAP (mmHg) 103  BP Location Right Arm  BP Method Automatic  Patient Position (if appropriate) Lying  Pulse Rate 67  Pulse Rate Source Monitor  ECG Heart Rate 68  Resp 16  Oxygen Therapy  SpO2 100 %  O2 Device Bi-PAP  FiO2 (%) 40 %  Patient Activity (if Appropriate) In bed  Pulse Oximetry Type Continuous  Post Treatment  Dialyzer Clearance Lightly streaked  Hemodialysis Intake (mL) 200 mL  Liters Processed 90  Fluid Removed (mL) 3000 mL  Tolerated HD Treatment Yes  Post-Hemodialysis Comments Treatment completed and blood returned without issue.  AVG/AVF Arterial Site Held (minutes) 10 minutes (Gauze applied and secured with paper taper; hemostasis achieved.)  AVG/AVF Venous Site Held (minutes) 10 minutes (Gauze applied and secured with paper taper; hemostasis achieved.)  Note  Patient Observations Patient awakened from sleep; no acute distress noted; patient stable for return transport.  Fistula / Graft Left Forearm Arteriovenous fistula  No placement date or time found.   Placed prior to admission: Yes  Orientation: Left  Access Location: Forearm  Access Type: Arteriovenous fistula  Site Condition No complications  Fistula / Graft Assessment Present;Thrill;Bruit  Status Flushed;Patent;Deaccessed  Drainage Description None      [name]

## 2023-01-09 NOTE — Consult Note (Signed)
Renal Service Consult Note [facility]  ANTAEUS RIDEOUT 01/09/2023 [name], MD Requesting Physician: Dr. Janee Morn  Reason for Consult: [name] w/ SOB HPI: Kirk [name] is a 62 y.o. year-old w/ PMH as below who presented  To ED w/ SOB for 2-3 wks. Hasn't missed HD. No fevers or chest pain. CXR showed bilat infiltrates edema vs infection. Pt required bipap. HD was done overnight early this am (3 L removed). Pt admitted. We are asked to see for dialysis.   Pt seen in room.  SOB much better.  Using Kirk bipap as a cpap machine for now. Resp distress resolved. No other c/o's.   ROS - denies CP, no joint pain, no HA, no blurry vision, no rash, no diarrhea, no nausea/ vomiting, no dysuria, no difficulty voiding   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)    Anxiety    Asthma    Asthma    moderate persistent   Carpal tunnel syndrome    Cellulitis and abscess of trunk    Cholelithiasis 07/13/2014   Chronic headaches    Cigarette smoker 07/11/2017   Stopped 2/022  - referred to start Lung cancer screening 04/2021         Debility, unspecified    Dermatophytosis of Kirk body    Dysrhythmia    history of   Edema    End stage renal disease on dialysis (HCC)    "MWF; [facility] in [place]" (10/21/2014)   Essential hypertension, benign    Generalized anxiety disorder 05/25/2022   GERD (gastroesophageal reflux disease)    Gout, unspecified    HTN (hypertension)    Hypertrophy of prostate without urinary obstruction and other lower urinary tract symptoms (LUTS)    Hypotension, unspecified    Impotence of organic origin    Insomnia, unspecified    Kidney replaced by transplant    Localization-related (focal) (partial) epilepsy and epileptic syndromes with complex partial seizures, without  mention of intractable epilepsy    12-15-19- Wife states he has NEVER had a seizure    Lumbago    Memory loss    OSA on CPAP    Other and unspecified hyperlipidemia    controlled /managed per wife    Other chronic nonalcoholic liver disease    Other malaise and fatigue    Other nonspecific abnormal serum enzyme levels    Pain in joint, lower leg    Pain in joint, upper arm    Pneumonia "several times"   PONV (postoperative nausea and vomiting)    Renal dialysis status(V45.11) 02/05/2010   restarted 01/02/13 ofter renal trransplant failure   Secondary hyperparathyroidism (of renal origin)    Shortness of breath    Sleep apnea    wears cpap    Tension headache    Unspecified constipation    Unspecified essential hypertension    Unspecified hereditary and idiopathic peripheral neuropathy    Unspecified vitamin D deficiency    Past Surgical History  Past Surgical History:  Procedure Laterality Date   AV FISTULA PLACEMENT Left ?2010   "forearm; at [facility]"   BACK SURGERY     CARDIAC CATHETERIZATION  03/21/2011   CHOLECYSTECTOMY N/A 10/21/2014   Procedure: LAPAROSCOPIC CHOLECYSTECTOMY WITH INTRAOPERATIVE CHOLANGIOGRAM;  Surgeon: Chevis Pretty III, MD;  Location: [facility] OR;  Service: General;  Laterality: N/A;   COLONOSCOPY     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   IR ANGIO INTRA EXTRACRAN SEL INTERNAL CAROTID UNI R MOD SED  11/30/2021   IR ANGIOGRAM FOLLOW UP STUDY  11/30/2021   IR AV DIALY SHUNT INTRO NEEDLE/INTRACATH INITIAL W/PTA/IMG LEFT  12/13/2022   IR BONE MARROW BIOPSY & ASPIRATION  03/27/2022   IR NEURO EACH ADD'L AFTER BASIC UNI RIGHT (MS)  11/30/2021   IR TRANSCATH/EMBOLIZ  11/30/2021   IR US GUIDE VASC ACCESS LEFT  12/13/2022   KIDNEY TRANSPLANT  08/17/2011   [facility]    LAPAROSCOPIC CHOLECYSTECTOMY  10/21/2014   w/IOC   LEFT HEART CATHETERIZATION WITH CORONARY ANGIOGRAM N/A 03/21/2011   Procedure: LEFT HEART CATHETERIZATION WITH CORONARY  ANGIOGRAM;  Surgeon: Chrystie Nose, MD;  Location: [facility] CATH LAB;  Service: Cardiovascular;  Laterality: N/A;   NEPHRECTOMY  08/2013   removed transplaned kidney   POLYPECTOMY     POSTERIOR FUSION CERVICAL SPINE  06/25/2012   for spinal stenosis   RADIOLOGY WITH ANESTHESIA N/A 11/30/2021   Procedure: RIGHT MIDDLE MENINGEAL ARTERY EMBOLIZATION;  Surgeon: Radiologist, Medication, MD;  Location: [facility] OR;  Service: Radiology;  Laterality: N/A;   VASECTOMY  2010   Family History  Family History  Adopted: Yes  Problem Relation Age of Onset   Colon cancer Neg Hx    Esophageal cancer Neg Hx    Rectal cancer Neg Hx    Stomach cancer Neg Hx    Colon polyps Neg Hx    Social History  reports that he quit smoking about 2 years ago. His smoking use included cigarettes. He started smoking about 31 years ago. He has a 16 pack-year smoking history. He has never used smokeless tobacco. He reports that he does not drink alcohol and does not use drugs. Allergies  Allergies  Allergen Reactions   Codeine Nausea And Vomiting   Hydrocodone-Acetaminophen Nausea And Vomiting   Home medications Prior to Admission medications   Medication Sig Start Date End Date Taking? Authorizing Provider  acetaminophen (TYLENOL) 500 MG tablet Take 1 tablet (500 mg total) by mouth every 6 (six) hours as needed for moderate pain. 09/12/14  Yes Standley Brooking, MD  albuterol (PROVENTIL) (2.5 MG/3ML) 0.083% nebulizer solution Take 3 mLs (2.5 mg total) by nebulization every 6 (six) hours as needed for wheezing or shortness of breath. 11/13/22  Yes Glenford Bayley, NP  albuterol (VENTOLIN HFA) 108 (90 Base) MCG/ACT inhaler Inhale 2 puffs into Kirk lungs every 6 (six) hours as needed for wheezing or shortness of breath. 10/29/22  Yes Nyoka Cowden, MD  amLODipine (NORVASC) 10 MG tablet TAKE 1 TABLET BY MOUTH EVERY DAY 12/05/22  Yes Ngetich, Dinah C, NP  aspirin 81 MG chewable tablet Chew 81 mg by mouth daily. 09/28/20  Yes [provider]  calcitRIOL (ROCALTROL) 0.5 MCG capsule Take 2 capsules (1 mcg total) by mouth every Monday, Wednesday, and Friday with hemodialysis. 06/04/18  Yes Black, Lesle Chris, NP  Cholecalciferol (VITAMIN D3) 25 MCG (1000 UT) CAPS Take 4,000 Units by mouth daily. Take at home on Tues, Thurs, Sat, and Sun   Yes [provider]  cinacalcet (SENSIPAR) 30 MG tablet Take 2 tablets (60 mg total) by mouth 3 (three) times a week. 11/29/21  Yes Angiulli, Mcarthur Rossetti, PA-C  diphenhydrAMINE (BENADRYL) 25 mg capsule Take 25 mg by mouth every 6 (six) hours as needed  for allergies.   Yes [provider]  doxazosin (CARDURA) 8 MG tablet Take 1 tablet (8 mg total) by mouth daily. [name] taking differently: Take 8 mg by mouth at bedtime. 04/24/22  Yes Alver Sorrow, NP  fluticasone-salmeterol (ADVAIR HFA) 604-54 MCG/ACT inhaler Inhale 2 puffs into Kirk lungs 2 (two) times daily. 11/01/22  Yes Nyoka Cowden, MD  FOSRENOL 1000 MG PACK Take 1 packet by mouth with breakfast, with lunch, and with evening meal. Also take when eating snacks 02/26/22  Yes [provider]  guaiFENesin-dextromethorphan (ROBITUSSIN DM) 100-10 MG/5ML syrup Take 5 mLs by mouth every 4 (four) hours as needed for cough. 09/18/22  Yes Glenford Bayley, NP  hydrALAZINE (APRESOLINE) 100 MG tablet Take 1 tablet (100 mg total) by mouth 3 (three) times daily. 05/22/22  Yes Ngetich, Dinah C, NP  isosorbide mononitrate (IMDUR) 120 MG 24 hr tablet Take 1 tablet (120 mg total) by mouth daily. [name] taking differently: Take 120 mg by mouth at bedtime. 06/28/22  Yes Alver Sorrow, NP  metoprolol tartrate (LOPRESSOR) 100 MG tablet TAKE 1 TABLET BY MOUTH TWICE A DAY 09/05/22  Yes Ngetich, Dinah C, NP  multivitamin (RENA-VIT) TABS tablet Take 1 tablet by mouth daily. 11/29/21  Yes Angiulli, Mcarthur Rossetti, PA-C  pantoprazole (PROTONIX) 40 MG tablet Take 1 tablet (40 mg total) by mouth 2 (two) times daily before a meal. 10/02/22  Yes Ngetich,  Dinah C, NP  pravastatin (PRAVACHOL) 40 MG tablet TAKE 1 TABLET BY MOUTH EVERY DAY IN Kirk EVENING 12/04/22  Yes Ngetich, Dinah C, NP  sevelamer carbonate (RENVELA) 800 MG tablet Take 2 tablets (1,600 mg total) by mouth as needed (snacks). [name] taking differently: Take 1,600 mg by mouth 4 (four) times daily. Take with a meal and snacks 11/29/21  Yes Angiulli, Mcarthur Rossetti, PA-C  traZODone (DESYREL) 50 MG tablet Take 0.5-1 tablets (25-50 mg total) by mouth at bedtime as needed. for sleep [name] taking differently: Take 50 mg by mouth at bedtime as needed for sleep. for sleep 05/22/22  Yes Ngetich, Dinah C, NP  amoxicillin (AMOXIL) 500 MG tablet Take 500 mg by mouth 2 (two) times daily. 01/07/23   [provider]  ondansetron (ZOFRAN-ODT) 4 MG disintegrating tablet TAKE 1 TABLET BY MOUTH EVERY 8 HOURS AS NEEDED FOR NAUSEA AND VOMITING 10/02/22   Ngetich, Dinah C, NP     Vitals:   01/09/23 0554 01/09/23 0617 01/09/23 0634 01/09/23 0734  BP: (!) 178/135 (!) 193/73 (!) 175/76 (!) 179/66  Pulse: 80 70 67 72  Resp: 16 20 16  (!) 21  Temp:   98.3 F (36.8 C) 98 F (36.7 C)  TempSrc:   Axillary Oral  SpO2: 100% 100% 100% 97%   Exam Gen alert, no distress No rash, cyanosis or gangrene Sclera anicteric, throat clear  No jvd or bruits Chest crackles R base, L clear RRR no RG Abd soft ntnd no mass or ascites +bs GU nl male MS no joint effusions or deformity Ext no LE or UE edema, no wounds or ulcers Neuro is alert, Ox 3 , nf    LFA AVF+bruit      Renal-related home meds: - norvasc 10, cardura 8 hs, metoprolol 100 bid, hydralazine 100 tid - fosrenol 1 pack ac tid - renvela 2 ac tid - fosrenol 1 ac tid  - imdur 120 - renavite - others: albuterol/ advair, statin, PPI    OP HD: MWF [facility]  4h  450  90.0kg   2/2 bath  AVF  Heparin 2500 - last HD 12/16, post wt 90.6kg, making it to dry wt - mircera 150 q2, last 12/13, due 12/27 - rocaltrol 1.5 mcg  - sensipar 60mg      Assessment/ Plan: Acute hypoxic resp failure - w/ vol overload most likely, could be due to HTN or ischemia or losing body wt and needs lower dry wt. Also possible CAP component, IV abx started.  ESKD - on HD MWF. No missed HD. Had HD early this am here. Next HD Friday.  HTN - high BP's on presentation. Cont home bp meds.  Volume - as above, lower w/ HD Anemia of eskd - Hb 8- 10 here. Next esa due 12/17.  MBD ckd - CCa in range, add on phos. Follow.      Vinson Moselle  MD [facility] 01/09/2023, 9:16 AM  Recent Labs  Lab 01/08/23 1918 01/08/23 1923 01/08/23 2127  HGB 9.9* 17.1* 8.5*  ALBUMIN  --  3.6  --   CALCIUM  --  9.0  --   CREATININE  --  10.40*  --   K 4.1 4.1  --    Inpatient medications:  amLODipine  10 mg Oral Daily   aspirin  81 mg Oral Daily   calcitRIOL  1 mcg Oral Q M,W,F-HD   Chlorhexidine Gluconate Cloth  6 each Topical Q0600   cinacalcet  60 mg Oral Once per day on Monday Wednesday Friday   doxazosin  8 mg Oral QHS   hydrALAZINE  10 mg Intravenous Once   hydrALAZINE  100 mg Oral TID   isosorbide mononitrate  120 mg Oral QHS   Lanthanum Carbonate  1 packet Oral TID with meals   metoprolol tartrate  100 mg Oral BID   mometasone-formoterol  2 puff Inhalation BID   multivitamin  1 tablet Oral Daily   nitroGLYCERIN  1 inch Topical Q6H   pantoprazole  40 mg Oral BID AC   pravastatin  40 mg Oral q1800   sevelamer carbonate  1,600 mg Oral QID    azithromycin Stopped (01/09/23 0845)   cefTRIAXone (ROCEPHIN)  IV     acetaminophen **OR** acetaminophen, albuterol, diphenhydrAMINE, guaiFENesin-dextromethorphan, hydrALAZINE, nitroGLYCERIN, ondansetron **OR** ondansetron (ZOFRAN) IV, traZODone

## 2023-01-09 NOTE — ED Notes (Addendum)
Pt placed himself back on bi-pap, ECHO at bedside

## 2023-01-09 NOTE — ED Notes (Signed)
Patient transported to dialysis at this time with a nurse , respiratory tech and transport.

## 2023-01-09 NOTE — Assessment & Plan Note (Signed)
On rescue BIPAP for the moment.

## 2023-01-09 NOTE — Assessment & Plan Note (Signed)
See HTN urgency above.

## 2023-01-09 NOTE — Assessment & Plan Note (Addendum)
DDx includes acute CHF due to volume overload / hypertensive emergency, vs BLL PNA, possibly aspiration. Given multiple week lead up with persistently high and higher blood pressures at home and now in ED as reported by family, as well as BNP of 1500, trops in the 30s and flat (last time he had BPs, BNP, and trop that looked very similar to this was CHF admit in Aug 2020), as well as no reported fevers.  Im inclined to be more suspicious of CHF / HTN emergency than PNA. Regardless, treating as both CHF / HTN emergency AND BLL PNA for the moment Rescue BIPAP

## 2023-01-09 NOTE — Assessment & Plan Note (Addendum)
Despite reported compliance with BP meds: BP running quite high (190s-210s systolic) in ED today. Question If pt has decompensated CHF and acute hypoxic resp failure due to uncontrolled HTN? Question if pt dry weight may need to be adjusted down and he is volume overloaded? NTG paste Hydralazine PRN Resume all home BP meds Due to acute hypoxic resp failure requiring BIPAP, pt going for urgent dialysis. 2d echo ordered (looks like Dr. Rennis Golden had ordered this for 1/16, but dont really want to wait with admission today)

## 2023-01-09 NOTE — Progress Notes (Signed)
Pt. Transported from Room 27 in the ED to dialysis bay 5 with RN, transport team on Bipap on 100% O2 without any complications. Rt assigned to floor notified.

## 2023-01-09 NOTE — Assessment & Plan Note (Addendum)
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 Pt currently getting dialized for HTN urgency, concern for volume overload.

## 2023-01-09 NOTE — Progress Notes (Signed)
RT NOTE: Patient taken off bipap and placed on RA. Patient tolerating well at this time with no increased WOB. Sats are 100% and vitals are stable. RT in ED notified. Bipap on standby at this time. RT will continue to monitor.

## 2023-01-09 NOTE — Assessment & Plan Note (Signed)
Question of BLL PNA on CT scan. Check COVID-19 Check RVP Started on rocephin + azithro in ED, will go ahead and continue this for the moment Procalcitonin ordered and pending

## 2023-01-09 NOTE — Progress Notes (Signed)
RT NOTE: RT transported patient on bipap from HD to ED room 9 with no complications. Vitals are stable. RT in ED given report.

## 2023-01-09 NOTE — ED Notes (Signed)
ED TO INPATIENT HANDOFF REPORT  ED Nurse Name and Phone #:  Corliss Blacker, RN [phone]  S Name/Age/Gender Kirk [name] 62 y.o. male Room/Bed: 009C/009C  Code Status   Code Status: Full Code  Home/SNF/Other Home Patient oriented to: self, place, time, and situation Is this baseline? Yes   Triage Complete: Triage complete  Chief Complaint Bilateral pleural effusion [J90] Pneumonia of both lower lobes due to infectious organism [J18.9] Acute hypoxic respiratory failure (HCC) [J96.01]  Triage Note Patient c/o SOB, has not missed dialysis but sats are 89% and he is unable to speak in complete sentences. 2L O2 applied in triage.   Allergies Allergies  Allergen Reactions   Codeine Nausea And Vomiting   Hydrocodone-Acetaminophen Nausea And Vomiting    Level of Care/Admitting Diagnosis ED Disposition     ED Disposition  Admit   Condition  --   Comment  Hospital Area: [facility] [[id number]]  Level of Care: Progressive [102]  Admit to Progressive based on following criteria: RESPIRATORY PROBLEMS hypoxemic/hypercapnic respiratory failure that is responsive to NIPPV (BiPAP) or High Flow Nasal Cannula (6-80 lpm). Frequent assessment/intervention, no > Q2 hrs < Q4 hrs, to maintain oxygenation and pulmonary hygiene.  May admit patient to [facility] or [facility] if equivalent level of care is available:: No  Covid Evaluation: Asymptomatic - no recent exposure (last 10 days) testing not required  Diagnosis: Acute hypoxic respiratory failure [facility]) [[id number]]  Admitting Physician: Rodolph Bong [[id number]]  Attending Physician: Rodolph Bong [[id number]]  Certification:: I certify this patient will need inpatient services for at least 2 midnights  Expected Medical Readiness: 01/11/2023          B Medical/Surgery History Past Medical History:  Diagnosis Date   Acute edema of lung, unspecified    Acute, but ill-defined, cerebrovascular disease    Allergy     Anemia    Anemia in chronic kidney disease(285.21)    Anxiety    Asthma    Asthma    moderate persistent   Carpal tunnel syndrome    Cellulitis and abscess of trunk    Cholelithiasis 07/13/2014   Chronic headaches    Cigarette smoker 07/11/2017   Stopped 2/022  - referred to start Lung cancer screening 04/2021         Debility, unspecified    Dermatophytosis of the body    Dysrhythmia    history of   Edema    End stage renal disease on dialysis (HCC)    "MWF; [facility] in [place]" (10/21/2014)   Essential hypertension, benign    Generalized anxiety disorder 05/25/2022   GERD (gastroesophageal reflux disease)    Gout, unspecified    HTN (hypertension)    Hypertrophy of prostate without urinary obstruction and other lower urinary tract symptoms (LUTS)    Hypotension, unspecified    Impotence of organic origin    Insomnia, unspecified    Kidney replaced by transplant    Localization-related (focal) (partial) epilepsy and epileptic syndromes with complex partial seizures, without mention of intractable epilepsy    12-15-19- Wife states he has NEVER had a seizure    Lumbago    Memory loss    OSA on CPAP    Other and unspecified hyperlipidemia    controlled /managed per wife    Other chronic nonalcoholic liver disease    Other malaise and fatigue    Other nonspecific abnormal serum enzyme levels    Pain in joint, lower leg  Pain in joint, upper arm    Pneumonia "several times"   PONV (postoperative nausea and vomiting)    Renal dialysis status(V45.11) 02/05/2010   restarted 01/02/13 ofter renal trransplant failure   Secondary hyperparathyroidism (of renal origin)    Shortness of breath    Sleep apnea    wears cpap    Tension headache    Unspecified constipation    Unspecified essential hypertension    Unspecified hereditary and idiopathic peripheral neuropathy    Unspecified vitamin D deficiency    Past Surgical History:  Procedure Laterality Date   AV  FISTULA PLACEMENT Left ?2010   "forearm; at [facility]"   BACK SURGERY     CARDIAC CATHETERIZATION  03/21/2011   CHOLECYSTECTOMY N/A 10/21/2014   Procedure: LAPAROSCOPIC CHOLECYSTECTOMY WITH INTRAOPERATIVE CHOLANGIOGRAM;  Surgeon: Chevis Pretty III, MD;  Location: [facility] OR;  Service: General;  Laterality: N/A;   COLONOSCOPY     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   IR ANGIO INTRA EXTRACRAN SEL INTERNAL CAROTID UNI R MOD SED  11/30/2021   IR ANGIOGRAM FOLLOW UP STUDY  11/30/2021   IR AV DIALY SHUNT INTRO NEEDLE/INTRACATH INITIAL W/PTA/IMG LEFT  12/13/2022   IR BONE MARROW BIOPSY & ASPIRATION  03/27/2022   IR NEURO EACH ADD'L AFTER BASIC UNI RIGHT (MS)  11/30/2021   IR TRANSCATH/EMBOLIZ  11/30/2021   IR US GUIDE VASC ACCESS LEFT  12/13/2022   KIDNEY TRANSPLANT  08/17/2011   [facility]    LAPAROSCOPIC CHOLECYSTECTOMY  10/21/2014   w/IOC   LEFT HEART CATHETERIZATION WITH CORONARY ANGIOGRAM N/A 03/21/2011   Procedure: LEFT HEART CATHETERIZATION WITH CORONARY ANGIOGRAM;  Surgeon: Chrystie Nose, MD;  Location: [facility] CATH LAB;  Service: Cardiovascular;  Laterality: N/A;   NEPHRECTOMY  08/2013   removed transplaned kidney   POLYPECTOMY     POSTERIOR FUSION CERVICAL SPINE  06/25/2012   for spinal stenosis   RADIOLOGY WITH ANESTHESIA N/A 11/30/2021   Procedure: RIGHT MIDDLE MENINGEAL ARTERY EMBOLIZATION;  Surgeon: Radiologist, Medication, MD;  Location: [facility] OR;  Service: Radiology;  Laterality: N/A;   VASECTOMY  2010     A IV Location/Drains/Wounds Patient Lines/Drains/Airways Status     Active Line/Drains/Airways     Name Placement date Placement time Site Days   Peripheral IV 01/08/23 20 G Right Antecubital 01/08/23  1929  Antecubital   1   Fistula / Graft Left Forearm --  --  Forearm  --   Fistula / Graft Left Forearm Arteriovenous fistula --  --  Forearm  --            Intake/Output Last 24 hours  Intake/Output Summary (Last 24 hours) at 01/09/2023 1804 Last data  filed at 01/09/2023 6213 Gross per 24 hour  Intake 100 ml  Output 3000 ml  Net -2900 ml    Labs/Imaging Results for orders placed or performed during the hospital encounter of 01/08/23 (from the past 48 hours)  I-Stat venous blood gas, ([facility] ED, [facility], [facility])     Status: Abnormal   Collection Time: 01/08/23  7:18 PM  Result Value Ref Range   pH, Ven 7.427 7.25 - 7.43   pCO2, Ven 40.1 (L) 44 - 60 mmHg   pO2, Ven 50 (H) 32 - 45 mmHg   Bicarbonate 26.4 20.0 - 28.0 mmol/L   TCO2 28 22 - 32 mmol/L   O2 Saturation 86 %   Acid-Base Excess 2.0 0.0 - 2.0 mmol/L   Sodium 141 135 -  145 mmol/L   Potassium 4.1 3.5 - 5.1 mmol/L   Calcium, Ion 1.07 (L) 1.15 - 1.40 mmol/L   HCT 29.0 (L) 39.0 - 52.0 %   Hemoglobin 9.9 (L) 13.0 - 17.0 g/dL   Sample type VENOUS   I-Stat CG4 Lactic Acid     Status: None   Collection Time: 01/08/23  7:19 PM  Result Value Ref Range   Lactic Acid, Venous 0.6 0.5 - 1.9 mmol/L  Comprehensive metabolic panel     Status: Abnormal   Collection Time: 01/08/23  7:23 PM  Result Value Ref Range   Sodium 142 135 - 145 mmol/L   Potassium 4.1 3.5 - 5.1 mmol/L   Chloride 103 98 - 111 mmol/L   CO2 24 22 - 32 mmol/L   Glucose, Bld 117 (H) 70 - 99 mg/dL    Comment: Glucose reference range applies only to samples taken after fasting for at least 8 hours.   BUN 30 (H) 6 - 20 mg/dL   Creatinine, Ser 65.78 (H) 0.61 - 1.24 mg/dL   Calcium 9.0 8.9 - 46.9 mg/dL   Total Protein 6.7 6.5 - 8.1 g/dL   Albumin 3.6 3.5 - 5.0 g/dL   AST 13 (L) 15 - 41 U/L   ALT 13 0 - 44 U/L   Alkaline Phosphatase 63 38 - 126 U/L   Total Bilirubin 0.7 <1.2 mg/dL   GFR, Estimated 5 (L) >60 mL/min    Comment: (NOTE) Calculated using the CKD-EPI Creatinine Equation (2021)    Anion gap 15 5 - 15    Comment: Performed at [facility], [address], [place], [place] [address]  CBC with Differential     Status: Abnormal   Collection Time: 01/08/23  7:23 PM  Result Value Ref Range   WBC 1.4 (LL) 4.0 -  10.5 K/uL    Comment: REPEATED TO VERIFY WHITE COUNT CONFIRMED ON SMEAR THIS CRITICAL RESULT HAS VERIFIED AND BEEN CALLED TO H. HERNANDEZ, RN BY DAISY BLU ON 12 17 2024 AT 2002, AND HAS BEEN READ BACK.     RBC 5.41 4.22 - 5.81 MIL/uL   Hemoglobin 17.1 (H) 13.0 - 17.0 g/dL   HCT 84.1 (H) 32.4 - 40.1 %   MCV 99.6 80.0 - 100.0 fL   MCH 31.6 26.0 - 34.0 pg   MCHC 31.7 30.0 - 36.0 g/dL   RDW 02.7 25.3 - 66.4 %   Platelets 38 (L) 150 - 400 K/uL    Comment: Immature Platelet Fraction may be clinically indicated, consider ordering this additional test [id number] REPEATED TO VERIFY PLATELET COUNT CONFIRMED BY SMEAR    nRBC 0.0 0.0 - 0.2 %   Neutrophils Relative % 73 %   Neutro Abs 1.0 (L) 1.7 - 7.7 K/uL   Lymphocytes Relative 13 %   Lymphs Abs 0.2 (L) 0.7 - 4.0 K/uL   Monocytes Relative 10 %   Monocytes Absolute 0.1 0.1 - 1.0 K/uL   Eosinophils Relative 2 %   Eosinophils Absolute 0.0 0.0 - 0.5 K/uL   Basophils Relative 2 %   Basophils Absolute 0.0 0.0 - 0.1 K/uL   Immature Granulocytes 0 %   Abs Immature Granulocytes 0.00 0.00 - 0.07 K/uL    Comment: Performed at [facility], [address], [place], [place] [address]  Brain natriuretic peptide     Status: Abnormal   Collection Time: 01/08/23  7:23 PM  Result Value Ref Range   B Natriuretic Peptide 1,590.5 (H)  0.0 - 100.0 pg/mL    Comment: Performed at [facility], [address] [place], [place] [address]  Troponin I (High Sensitivity)     Status: Abnormal   Collection Time: 01/08/23  7:31 PM  Result Value Ref Range   Troponin I (High Sensitivity) 36 (H) <18 ng/L    Comment: (NOTE) Elevated high sensitivity troponin I (hsTnI) values and significant  changes across serial measurements may suggest ACS but many other  chronic and acute conditions are known to elevate hsTnI results.  Refer to the "Links" section for chest pain algorithms and additional  guidance. Performed at [facility], [address]  [place], [place] [address]   Troponin I (High Sensitivity)     Status: Abnormal   Collection Time: 01/08/23  9:27 PM  Result Value Ref Range   Troponin I (High Sensitivity) 33 (H) <18 ng/L    Comment: (NOTE) Elevated high sensitivity troponin I (hsTnI) values and significant  changes across serial measurements may suggest ACS but many other  chronic and acute conditions are known to elevate hsTnI results.  Refer to the "Links" section for chest pain algorithms and additional  guidance. Performed at [facility], [address] [place], [place] [address]   CBC with Differential/Platelet     Status: Abnormal   Collection Time: 01/08/23  9:27 PM  Result Value Ref Range   WBC 3.8 (L) 4.0 - 10.5 K/uL    Comment: REPEATED TO VERIFY RECOLLECTED SAMPLE    RBC 2.59 (L) 4.22 - 5.81 MIL/uL   Hemoglobin 8.5 (L) 13.0 - 17.0 g/dL    Comment: REPEATED TO VERIFY RECOLLECTED SAMPLE    HCT 26.3 (L) 39.0 - 52.0 %   MCV 101.5 (H) 80.0 - 100.0 fL   MCH 32.8 26.0 - 34.0 pg   MCHC 32.3 30.0 - 36.0 g/dL   RDW 64.3 32.9 - 51.8 %   Platelets 98 (L) 150 - 400 K/uL    Comment: REPEATED TO VERIFY RECOLLECTED SAMPLE    nRBC 0.0 0.0 - 0.2 %   Neutrophils Relative % 65 %   Neutro Abs 2.4 1.7 - 7.7 K/uL   Lymphocytes Relative 20 %   Lymphs Abs 0.8 0.7 - 4.0 K/uL   Monocytes Relative 12 %   Monocytes Absolute 0.4 0.1 - 1.0 K/uL   Eosinophils Relative 2 %   Eosinophils Absolute 0.1 0.0 - 0.5 K/uL   Basophils Relative 1 %   Basophils Absolute 0.1 0.0 - 0.1 K/uL   Immature Granulocytes 0 %   Abs Immature Granulocytes 0.01 0.00 - 0.07 K/uL    Comment: Performed at [facility], [address], [place], [place] [address]  SARS Coronavirus 2 by RT PCR (hospital order, performed in [facility] hospital lab) *cepheid single result test* Anterior Nasal Swab     Status: None   Collection Time: 01/09/23 12:08 AM   Specimen: Anterior Nasal Swab  Result Value Ref Range   SARS Coronavirus 2 by RT PCR  NEGATIVE NEGATIVE    Comment: Performed at [facility], [address], [place], [place] [address]  Hepatitis B surface antigen     Status: None   Collection Time: 01/09/23  2:09 AM  Result Value Ref Range   Hepatitis B Surface Ag NON REACTIVE NON REACTIVE    Comment: Performed at [facility], [address] [place], [place] [address]  CBC     Status: Abnormal  Collection Time: 01/09/23  9:21 AM  Result Value Ref Range   WBC 2.9 (L) 4.0 - 10.5 K/uL   RBC 2.59 (L) 4.22 - 5.81 MIL/uL   Hemoglobin 8.4 (L) 13.0 - 17.0 g/dL   HCT 16.1 (L) 09.6 - 04.5 %   MCV 100.4 (H) 80.0 - 100.0 fL   MCH 32.4 26.0 - 34.0 pg   MCHC 32.3 30.0 - 36.0 g/dL   RDW 40.9 81.1 - 91.4 %   Platelets 191 150 - 400 K/uL   nRBC 0.0 0.0 - 0.2 %    Comment: Performed at [facility], [address], [place], [place] [address]  Basic metabolic panel     Status: Abnormal   Collection Time: 01/09/23 10:48 AM  Result Value Ref Range   Sodium 139 135 - 145 mmol/L   Potassium 3.3 (L) 3.5 - 5.1 mmol/L   Chloride 98 98 - 111 mmol/L   CO2 27 22 - 32 mmol/L   Glucose, Bld 127 (H) 70 - 99 mg/dL    Comment: Glucose reference range applies only to samples taken after fasting for at least 8 hours.   BUN 14 6 - 20 mg/dL   Creatinine, Ser 6.21 (H) 0.61 - 1.24 mg/dL   Calcium 9.2 8.9 - 30.8 mg/dL   GFR, Estimated 10 (L) >60 mL/min    Comment: (NOTE) Calculated using the CKD-EPI Creatinine Equation (2021)    Anion gap 14 5 - 15    Comment: Performed at [facility], [address] [place], [place] [address]  Procalcitonin     Status: None   Collection Time: 01/09/23 10:48 AM  Result Value Ref Range   Procalcitonin 0.33 ng/mL    Comment:        Interpretation: PCT (Procalcitonin) <= 0.5 ng/mL: Systemic infection (sepsis) is not likely. Local bacterial infection is possible. (NOTE)       Sepsis PCT Algorithm           Lower Respiratory Tract                                      Infection PCT  Algorithm    ----------------------------     ----------------------------         PCT < 0.25 ng/mL                PCT < 0.10 ng/mL          Strongly encourage             Strongly discourage   discontinuation of antibiotics    initiation of antibiotics    ----------------------------     -----------------------------       PCT 0.25 - 0.50 ng/mL            PCT 0.10 - 0.25 ng/mL               OR       >80% decrease in PCT            Discourage initiation of                                            antibiotics      Encourage discontinuation           of antibiotics    ----------------------------     -----------------------------  PCT >= 0.50 ng/mL              PCT 0.26 - 0.50 ng/mL               AND        <80% decrease in PCT             Encourage initiation of                                             antibiotics       Encourage continuation           of antibiotics    ----------------------------     -----------------------------        PCT >= 0.50 ng/mL                  PCT > 0.50 ng/mL               AND         increase in PCT                  Strongly encourage                                      initiation of antibiotics    Strongly encourage escalation           of antibiotics                                     -----------------------------                                           PCT <= 0.25 ng/mL                                                 OR                                        > 80% decrease in PCT                                      Discontinue / Do not initiate                                             antibiotics  Performed at [facility], [address] [place], [place] [address]   Phosphorus     Status: Abnormal   Collection Time: 01/09/23 10:48 AM  Result Value Ref Range   Phosphorus 4.7 (H) 2.5 - 4.6 mg/dL    Comment: Performed at [facility], [address], [place], [place] [address]   ECHOCARDIOGRAM  COMPLETE Result Date: 01/09/2023    ECHOCARDIOGRAM REPORT   Patient Name:   Kirk [name] Date of Exam: 01/09/2023 Medical Rec #:  [id number]       Height:       67.0 in Accession #:    [id number]      Weight:       206.4 lb Date of Birth:  Feb 25, 1960        BSA:          2.049 m Patient Age:    62 years        BP:           170/51 mmHg Patient Gender: M               HR:           73 bpm. Exam Location:  Inpatient Procedure: 2D Echo, Cardiac Doppler and Color Doppler Indications:    CHF  History:        Patient has prior history of Echocardiogram examinations, most                 recent 11/16/2021. Risk Factors:Hypertension and Diabetes.  Sonographer:    Webb Laws Referring Phys: [id number] JARED M GARDNER IMPRESSIONS  1. Left ventricular ejection fraction, by estimation, is 50 to 55%. The left ventricle has low normal function. The left ventricle has no regional wall motion abnormalities. Left ventricular diastolic parameters are consistent with Grade II diastolic dysfunction (pseudonormalization).  2. Right ventricular systolic function is normal. The right ventricular size is normal.  3. The mitral valve is normal in structure. Trivial mitral valve regurgitation. No evidence of mitral stenosis.  4. The aortic valve is tricuspid. There is moderate calcification of the aortic valve. Aortic valve regurgitation is not visualized. Moderate aortic valve stenosis.  5. The inferior vena cava is dilated in size with >50% respiratory variability, suggesting right atrial pressure of 8 mmHg. FINDINGS  Left Ventricle: Left ventricular ejection fraction, by estimation, is 50 to 55%. The left ventricle has low normal function. The left ventricle has no regional wall motion abnormalities. The left ventricular internal cavity size was normal in size. There is no left ventricular hypertrophy. Left ventricular diastolic parameters are consistent with Grade II diastolic dysfunction (pseudonormalization). Right Ventricle: The  right ventricular size is normal. No increase in right ventricular wall thickness. Right ventricular systolic function is normal. Left Atrium: Left atrial size was normal in size. Right Atrium: Right atrial size was normal in size. Pericardium: There is no evidence of pericardial effusion. Mitral Valve: The mitral valve is normal in structure. Mild mitral annular calcification. Trivial mitral valve regurgitation. No evidence of mitral valve stenosis. Tricuspid Valve: The tricuspid valve is normal in structure. Tricuspid valve regurgitation is trivial. No evidence of tricuspid stenosis. Aortic Valve: The aortic valve is tricuspid. There is moderate calcification of the aortic valve. Aortic valve regurgitation is not visualized. Moderate aortic stenosis is present. Aortic valve mean gradient measures 18.8 mmHg. Aortic valve peak gradient  measures 33.6 mmHg. Aortic valve area, by VTI measures 1.47 cm. Pulmonic Valve: The pulmonic valve was normal in structure. Pulmonic valve regurgitation is trivial. No evidence of pulmonic stenosis. Aorta: The aortic root is normal in size and structure. Venous: The inferior vena cava is dilated in size with greater than 50% respiratory variability, suggesting right atrial pressure of 8 mmHg. IAS/Shunts: No atrial level shunt detected by color flow Doppler.  LEFT VENTRICLE PLAX 2D LVIDd:  6.20 cm      Diastology LVIDs:         3.80 cm      LV e' medial:    4.57 cm/s LV PW:         1.10 cm      LV E/e' medial:  28.0 LV IVS:        1.00 cm      LV e' lateral:   5.77 cm/s LVOT diam:     2.20 cm      LV E/e' lateral: 22.2 LV SV:         92 LV SV Index:   45 LVOT Area:     3.80 cm  LV Volumes (MOD) LV vol d, MOD A2C: 172.0 ml LV vol d, MOD A4C: 156.0 ml LV vol s, MOD A2C: 81.5 ml LV vol s, MOD A4C: 75.7 ml LV SV MOD A2C:     90.5 ml LV SV MOD A4C:     156.0 ml LV SV MOD BP:      82.9 ml RIGHT VENTRICLE             IVC RV Basal diam:  4.60 cm     IVC diam: 2.20 cm RV Mid diam:     2.90 cm RV S prime:     13.20 cm/s TAPSE (M-mode): 2.8 cm LEFT ATRIUM             Index        RIGHT ATRIUM           Index LA diam:        4.10 cm 2.00 cm/m   RA Area:     18.80 cm LA Vol (A2C):   72.6 ml 35.42 ml/m  RA Volume:   53.30 ml  26.01 ml/m LA Vol (A4C):   77.4 ml 37.77 ml/m LA Biplane Vol: 78.7 ml 38.40 ml/m  AORTIC VALVE AV Area (Vmax):    1.49 cm AV Area (Vmean):   1.44 cm AV Area (VTI):     1.47 cm AV Vmax:           290.00 cm/s AV Vmean:          205.200 cm/s AV VTI:            0.630 m AV Peak Grad:      33.6 mmHg AV Mean Grad:      18.8 mmHg LVOT Vmax:         114.00 cm/s LVOT Vmean:        77.900 cm/s LVOT VTI:          0.243 m LVOT/AV VTI ratio: 0.39  AORTA Ao Root diam: 3.00 cm Ao Asc diam:  3.60 cm MITRAL VALVE                TRICUSPID VALVE MV Area (PHT): 4.00 cm     TR Peak grad:   37.2 mmHg MV E velocity: 128.00 cm/s  TR Vmax:        305.00 cm/s MV A velocity: 114.00 cm/s MV E/A ratio:  1.12         SHUNTS                             Systemic VTI:  0.24 m  Systemic Diam: 2.20 cm Arvilla Meres MD Electronically signed by Arvilla Meres MD Signature Date/Time: 01/09/2023/12:25:24 PM    Final    CT Head Wo Contrast Result Date: 01/09/2023 CLINICAL DATA:  Headache. EXAM: CT HEAD WITHOUT CONTRAST TECHNIQUE: Contiguous axial images were obtained from the base of the skull through the vertex without intravenous contrast. RADIATION DOSE REDUCTION: This exam was performed according to the departmental dose-optimization program which includes automated exposure control, adjustment of the mA and/or kV according to patient size and/or use of iterative reconstruction technique. COMPARISON:  CT head 07/30/2022. FINDINGS: Brain: No evidence of acute infarction, hemorrhage, hydrocephalus, extra-axial collection or mass lesion/mass effect. Vascular: No hyperdense vessel or unexpected calcification. Skull: Normal. Negative for fracture or focal lesion.  Sinuses/Orbits: No acute finding. Other: None. IMPRESSION: No acute intracranial abnormality. Electronically Signed   By: Darliss Cheney M.D.   On: 01/09/2023 01:23   CT Angio Chest PE W and/or Wo Contrast Result Date: 01/08/2023 CLINICAL DATA:  Hypoxia and shortness of breath EXAM: CT ANGIOGRAPHY CHEST WITH CONTRAST TECHNIQUE: Multidetector CT imaging of the chest was performed using the standard protocol during bolus administration of intravenous contrast. Multiplanar CT image reconstructions and MIPs were obtained to evaluate the vascular anatomy. RADIATION DOSE REDUCTION: This exam was performed according to the departmental dose-optimization program which includes automated exposure control, adjustment of the mA and/or kV according to patient size and/or use of iterative reconstruction technique. CONTRAST:  75mL OMNIPAQUE IOHEXOL 350 MG/ML SOLN COMPARISON:  Same day chest radiograph and CT chest 05/01/2022 FINDINGS: Cardiovascular: Negative for acute pulmonary embolism. No pericardial effusion. Coronary artery and aortic atherosclerotic calcification. Mediastinum/Nodes: Trachea and esophagus are unremarkable. Unchanged prominent right hilar and subcarinal lymph node measuring up to 1.0 cm. Lungs/Pleura: Interlobular septal thickening patchy ground-glass opacities greatest in the lower lungs. Bronchial wall thickening with mucous plugging. Centrilobular micro nodules and abutting tree opacities in the lower lungs. Small right and trace left pleural effusions. No pneumothorax. Upper Abdomen: No acute abnormality. Musculoskeletal: No acute fracture. Review of the MIP images confirms the above findings. IMPRESSION: 1. Negative for acute pulmonary embolism. 2. Airway inflammation/infection and/or aspiration greatest in the lower lobes. Small bilateral pleural effusions. Aortic Atherosclerosis (ICD10-I70.0). Electronically Signed   By: Minerva Fester M.D.   On: 01/08/2023 22:29   DG Chest Portable 1  View Result Date: 01/08/2023 CLINICAL DATA:  Shortness of breath. EXAM: PORTABLE CHEST 1 VIEW COMPARISON:  Chest radiographs 10/25/2022 and 07/30/2022; CT chest 05/01/2022 FINDINGS: Cardiac silhouette is again mildly enlarged. Trace pericardial fluid was seen on 05/01/2022 chest CT. Chroniclower lung reticular opacities are similar to 10/25/2022. No definite acute airspace opacity. No pleural effusion pneumothorax. No significant skeletal abnormality. IMPRESSION: 1. Chronic lower lung reticular opacities are similar to 10/25/2022 radiograph and 05/01/2022 CT, likely chronic lung disease. No definite acute airspace opacity. 2. Mild cardiomegaly. Electronically Signed   By: Neita Garnet M.D.   On: 01/08/2023 20:51    Pending Labs Unresulted Labs (From admission, onward)     Start     Ordered   01/08/23 2159  Hepatitis B surface antibody,quantitative  (New Admission Hemo Labs (Hepatitis B))  Add-on,   AD        01/08/23 2158   01/08/23 2005  CBC with Differential  Once,   STAT        01/08/23 2005            Vitals/Pain Today's Vitals   01/09/23 1333 01/09/23 1336 01/09/23 1500 01/09/23 1800  BP: (!) 171/71  (!) 154/63 (!) 145/81  Pulse: 86  66 66  Resp:   20 15  Temp:    97.6 F (36.4 C)  TempSrc:    Oral  SpO2:   97% 99%  PainSc:  0-No pain      Isolation Precautions No active isolations  Medications Medications  nitroGLYCERIN (NITROSTAT) SL tablet 0.4 mg (has no administration in time range)  Chlorhexidine Gluconate Cloth 2 % PADS 6 each (has no administration in time range)  nitroGLYCERIN (NITROGLYN) 2 % ointment 1 inch (has no administration in time range)  hydrALAZINE (APRESOLINE) injection 10-20 mg (has no administration in time range)  hydrALAZINE (APRESOLINE) injection 10 mg (has no administration in time range)  acetaminophen (TYLENOL) tablet 650 mg (has no administration in time range)    Or  acetaminophen (TYLENOL) suppository 650 mg (has no administration in  time range)  ondansetron (ZOFRAN) tablet 4 mg (has no administration in time range)    Or  ondansetron (ZOFRAN) injection 4 mg (has no administration in time range)  amLODipine (NORVASC) tablet 10 mg (10 mg Oral Given 01/09/23 0918)  calcitRIOL (ROCALTROL) capsule 1 mcg (1 mcg Oral Given 01/09/23 1334)  cinacalcet (SENSIPAR) tablet 60 mg (has no administration in time range)  albuterol (PROVENTIL) (2.5 MG/3ML) 0.083% nebulizer solution 2.5 mg (has no administration in time range)  diphenhydrAMINE (BENADRYL) capsule 25 mg (has no administration in time range)  Lanthanum Carbonate PACK 1 packet (has no administration in time range)  hydrALAZINE (APRESOLINE) tablet 100 mg (100 mg Oral Given 01/09/23 0918)  isosorbide mononitrate (IMDUR) 24 hr tablet 120 mg (has no administration in time range)  metoprolol tartrate (LOPRESSOR) tablet 100 mg (100 mg Oral Given 01/09/23 1333)  sevelamer carbonate (RENVELA) tablet 1,600 mg (has no administration in time range)  traZODone (DESYREL) tablet 50 mg (has no administration in time range)  pravastatin (PRAVACHOL) tablet 40 mg (has no administration in time range)  pantoprazole (PROTONIX) EC tablet 40 mg (40 mg Oral Given 01/09/23 0918)  multivitamin (RENA-VIT) tablet 1 tablet (1 tablet Oral Given 01/09/23 1334)  mometasone-formoterol (DULERA) 200-5 MCG/ACT inhaler 2 puff (2 puffs Inhalation Given 01/09/23 0920)  doxazosin (CARDURA) tablet 8 mg (has no administration in time range)  aspirin chewable tablet 81 mg (81 mg Oral Given 01/09/23 0918)  guaiFENesin-dextromethorphan (ROBITUSSIN DM) 100-10 MG/5ML syrup 5 mL (has no administration in time range)  azithromycin (ZITHROMAX) 500 mg in sodium chloride 0.9 % 250 mL IVPB (0 mg Intravenous Stopped 01/09/23 0845)  cefTRIAXone (ROCEPHIN) 2 g in sodium chloride 0.9 % 100 mL IVPB (has no administration in time range)  labetalol (NORMODYNE) injection 10 mg (10 mg Intravenous Given 01/08/23 2129)  iohexol  (OMNIPAQUE) 350 MG/ML injection 75 mL (75 mLs Intravenous Contrast Given 01/08/23 2153)  cefTRIAXone (ROCEPHIN) 1 g in sodium chloride 0.9 % 100 mL IVPB (0 g Intravenous Stopped 01/09/23 0025)  metroNIDAZOLE (FLAGYL) IVPB 500 mg (0 mg Intravenous Stopped 01/09/23 0846)  acetaminophen (TYLENOL) tablet 1,000 mg (1,000 mg Oral Given 01/08/23 2343)    Mobility walks     Focused Assessments Renal Assessment Handoff:  Hemodialysis Schedule: Hemodialysis Schedule: Monday/Wednesday/Friday Last Hemodialysis date and time: Wed around 0100 per pt family   Restricted appendage: left arm   R Recommendations: See Admitting Provider Note  Report given to:   Additional Notes:

## 2023-01-10 ENCOUNTER — Encounter (HOSPITAL_COMMUNITY): Payer: Self-pay | Admitting: Internal Medicine

## 2023-01-10 ENCOUNTER — Other Ambulatory Visit: Payer: Self-pay

## 2023-01-10 DIAGNOSIS — J9601 Acute respiratory failure with hypoxia: Secondary | ICD-10-CM | POA: Diagnosis not present

## 2023-01-10 LAB — CBC WITH DIFFERENTIAL/PLATELET
Abs Immature Granulocytes: 0.01 10*3/uL (ref 0.00–0.07)
Basophils Absolute: 0 10*3/uL (ref 0.0–0.1)
Basophils Relative: 1 %
Eosinophils Absolute: 0.1 10*3/uL (ref 0.0–0.5)
Eosinophils Relative: 3 %
HCT: 24.8 % — ABNORMAL LOW (ref 39.0–52.0)
Hemoglobin: 8.3 g/dL — ABNORMAL LOW (ref 13.0–17.0)
Immature Granulocytes: 0 %
Lymphocytes Relative: 18 %
Lymphs Abs: 0.6 10*3/uL — ABNORMAL LOW (ref 0.7–4.0)
MCH: 32.9 pg (ref 26.0–34.0)
MCHC: 33.5 g/dL (ref 30.0–36.0)
MCV: 98.4 fL (ref 80.0–100.0)
Monocytes Absolute: 0.4 10*3/uL (ref 0.1–1.0)
Monocytes Relative: 12 %
Neutro Abs: 2.1 10*3/uL (ref 1.7–7.7)
Neutrophils Relative %: 66 %
Platelets: 152 10*3/uL (ref 150–400)
RBC: 2.52 MIL/uL — ABNORMAL LOW (ref 4.22–5.81)
RDW: 14.4 % (ref 11.5–15.5)
WBC: 3.2 10*3/uL — ABNORMAL LOW (ref 4.0–10.5)
nRBC: 0 % (ref 0.0–0.2)

## 2023-01-10 LAB — RENAL FUNCTION PANEL
Albumin: 2.9 g/dL — ABNORMAL LOW (ref 3.5–5.0)
Anion gap: 17 — ABNORMAL HIGH (ref 5–15)
BUN: 29 mg/dL — ABNORMAL HIGH (ref 6–20)
CO2: 20 mmol/L — ABNORMAL LOW (ref 22–32)
Calcium: 8.7 mg/dL — ABNORMAL LOW (ref 8.9–10.3)
Chloride: 100 mmol/L (ref 98–111)
Creatinine, Ser: 8.01 mg/dL — ABNORMAL HIGH (ref 0.61–1.24)
GFR, Estimated: 7 mL/min — ABNORMAL LOW (ref >60)
Glucose, Bld: 142 mg/dL — ABNORMAL HIGH (ref 70–99)
Phosphorus: 7.5 mg/dL — ABNORMAL HIGH (ref 2.5–4.6)
Potassium: 4.3 mmol/L (ref 3.5–5.1)
Sodium: 137 mmol/L (ref 135–145)

## 2023-01-10 LAB — HEPATITIS B SURFACE ANTIBODY, QUANTITATIVE: Hep B S AB Quant (Post): 17.2 m[IU]/mL

## 2023-01-10 MED ORDER — HEPARIN SODIUM (PORCINE) 5000 UNIT/ML IJ SOLN
5000.0000 [IU] | Freq: Three times a day (TID) | INTRAMUSCULAR | Status: DC
  Filled 2023-01-10 (×4): qty 1

## 2023-01-10 MED ORDER — PROSOURCE PLUS PO LIQD
30.0000 mL | Freq: Two times a day (BID) | ORAL | Status: DC
Start: 2023-01-10 — End: 2023-01-12
  Administered 2023-01-10 – 2023-01-12 (×3): 30 mL via ORAL
  Filled 2023-01-10 (×4): qty 30

## 2023-01-10 NOTE — Progress Notes (Signed)
SLP Cancellation Note  Patient Details Name: Kirk [name] MRN: [id number] DOB: May 15, 1960   Cancelled treatment:       Reason Eval/Treat Not Completed: Other (comment). Pt sleeping on CPAP. Will f/u   Birgit Nowling, Riley Nearing 01/10/2023, 2:31 PM

## 2023-01-10 NOTE — Progress Notes (Addendum)
Pt receives out-pt HD at [facility] [place] on MWF. Case discussed with renal PA. Notes indicate pt for possible d/c tomorrow if stable. Inquired if pt would be appropriate for out-pt HD to avoid inpt HD on day of d/c. At this time, it is felt pt will require inpt HD due to pt's current medical needs. Will assist as needed.   Olivia Canter Renal Navigator [phone]

## 2023-01-10 NOTE — Progress Notes (Signed)
[facility] Progress Note   Subjective:   Seen in room - eating lunch. Reports dyspnea much better at rest, still feels it when moving around. S/p HD yesterday - did ok, 3L off.  Objective Vitals:   01/10/23 0330 01/10/23 0749 01/10/23 0821 01/10/23 1051  BP: 125/75 137/78 (!) 134/54 (!) 127/55  Pulse: 61 70  62  Resp: 19 15  18   Temp: 97.6 F (36.4 C) 97.7 F (36.5 C)  97.8 F (36.6 C)  TempSrc: Axillary Oral  Oral  SpO2: 98% 95%  100%  Weight: 90.3 kg      Physical Exam General: Well appearing man, NAD. Room air. Heart: RRR; no murmur Lungs: CTAB; no rales or wheezing Abdomen: soft Extremities: no LE edema Dialysis Access: L AVF +t/b  Additional Objective Labs: Basic Metabolic Panel: Recent Labs  Lab 01/08/23 1923 01/09/23 1048 01/10/23 0212  NA 142 139 137  K 4.1 3.3* 4.3  CL 103 98 100  CO2 24 27 20*  GLUCOSE 117* 127* 142*  BUN 30* 14 29*  CREATININE 10.40* 5.97* 8.01*  CALCIUM 9.0 9.2 8.7*  PHOS  --  4.7* 7.5*   Liver Function Tests: Recent Labs  Lab 01/08/23 1923 01/10/23 0212  AST 13*  --   ALT 13  --   ALKPHOS 63  --   BILITOT 0.7  --   PROT 6.7  --   ALBUMIN 3.6 2.9*   CBC: Recent Labs  Lab 01/08/23 1923 01/08/23 2127 01/09/23 0921 01/10/23 0804  WBC 1.4* 3.8* 2.9* 3.2*  NEUTROABS 1.0* 2.4  --  2.1  HGB 17.1* 8.5* 8.4* 8.3*  HCT 53.9* 26.3* 26.0* 24.8*  MCV 99.6 101.5* 100.4* 98.4  PLT 38* 98* 191 152   Studies/Results: ECHOCARDIOGRAM COMPLETE Result Date: 01/09/2023    ECHOCARDIOGRAM REPORT   Patient Name:   Kirk [name] Date of Exam: 01/09/2023 Medical Rec #:  [id number]       Height:       67.0 in Accession #:    [id number]      Weight:       206.4 lb Date of Birth:  August 10, 1960        BSA:          2.049 m Patient Age:    62 years        BP:           170/51 mmHg Patient Gender: M               HR:           73 bpm. Exam Location:  Inpatient Procedure: 2D Echo, Cardiac Doppler and Color Doppler Indications:     CHF  History:        Patient has prior history of Echocardiogram examinations, most                 recent 11/16/2021. Risk Factors:Hypertension and Diabetes.  Sonographer:    Webb Laws Referring Phys: 1 JARED M GARDNER IMPRESSIONS  1. Left ventricular ejection fraction, by estimation, is 50 to 55%. The left ventricle has low normal function. The left ventricle has no regional wall motion abnormalities. Left ventricular diastolic parameters are consistent with Grade II diastolic dysfunction (pseudonormalization).  2. Right ventricular systolic function is normal. The right ventricular size is normal.  3. The mitral valve is normal in structure. Trivial mitral valve regurgitation. No evidence of mitral stenosis.  4. The aortic valve is tricuspid. There is moderate calcification  of the aortic valve. Aortic valve regurgitation is not visualized. Moderate aortic valve stenosis.  5. The inferior vena cava is dilated in size with >50% respiratory variability, suggesting right atrial pressure of 8 mmHg. FINDINGS  Left Ventricle: Left ventricular ejection fraction, by estimation, is 50 to 55%. The left ventricle has low normal function. The left ventricle has no regional wall motion abnormalities. The left ventricular internal cavity size was normal in size. There is no left ventricular hypertrophy. Left ventricular diastolic parameters are consistent with Grade II diastolic dysfunction (pseudonormalization). Right Ventricle: The right ventricular size is normal. No increase in right ventricular wall thickness. Right ventricular systolic function is normal. Left Atrium: Left atrial size was normal in size. Right Atrium: Right atrial size was normal in size. Pericardium: There is no evidence of pericardial effusion. Mitral Valve: The mitral valve is normal in structure. Mild mitral annular calcification. Trivial mitral valve regurgitation. No evidence of mitral valve stenosis. Tricuspid Valve: The tricuspid valve  is normal in structure. Tricuspid valve regurgitation is trivial. No evidence of tricuspid stenosis. Aortic Valve: The aortic valve is tricuspid. There is moderate calcification of the aortic valve. Aortic valve regurgitation is not visualized. Moderate aortic stenosis is present. Aortic valve mean gradient measures 18.8 mmHg. Aortic valve peak gradient  measures 33.6 mmHg. Aortic valve area, by VTI measures 1.47 cm. Pulmonic Valve: The pulmonic valve was normal in structure. Pulmonic valve regurgitation is trivial. No evidence of pulmonic stenosis. Aorta: The aortic root is normal in size and structure. Venous: The inferior vena cava is dilated in size with greater than 50% respiratory variability, suggesting right atrial pressure of 8 mmHg. IAS/Shunts: No atrial level shunt detected by color flow Doppler.  LEFT VENTRICLE PLAX 2D LVIDd:         6.20 cm      Diastology LVIDs:         3.80 cm      LV e' medial:    4.57 cm/s LV PW:         1.10 cm      LV E/e' medial:  28.0 LV IVS:        1.00 cm      LV e' lateral:   5.77 cm/s LVOT diam:     2.20 cm      LV E/e' lateral: 22.2 LV SV:         92 LV SV Index:   45 LVOT Area:     3.80 cm  LV Volumes (MOD) LV vol d, MOD A2C: 172.0 ml LV vol d, MOD A4C: 156.0 ml LV vol s, MOD A2C: 81.5 ml LV vol s, MOD A4C: 75.7 ml LV SV MOD A2C:     90.5 ml LV SV MOD A4C:     156.0 ml LV SV MOD BP:      82.9 ml RIGHT VENTRICLE             IVC RV Basal diam:  4.60 cm     IVC diam: 2.20 cm RV Mid diam:    2.90 cm RV S prime:     13.20 cm/s TAPSE (M-mode): 2.8 cm LEFT ATRIUM             Index        RIGHT ATRIUM           Index LA diam:        4.10 cm 2.00 cm/m   RA Area:     18.80 cm LA Vol (A2C):  72.6 ml 35.42 ml/m  RA Volume:   53.30 ml  26.01 ml/m LA Vol (A4C):   77.4 ml 37.77 ml/m LA Biplane Vol: 78.7 ml 38.40 ml/m  AORTIC VALVE AV Area (Vmax):    1.49 cm AV Area (Vmean):   1.44 cm AV Area (VTI):     1.47 cm AV Vmax:           290.00 cm/s AV Vmean:          205.200 cm/s AV  VTI:            0.630 m AV Peak Grad:      33.6 mmHg AV Mean Grad:      18.8 mmHg LVOT Vmax:         114.00 cm/s LVOT Vmean:        77.900 cm/s LVOT VTI:          0.243 m LVOT/AV VTI ratio: 0.39  AORTA Ao Root diam: 3.00 cm Ao Asc diam:  3.60 cm MITRAL VALVE                TRICUSPID VALVE MV Area (PHT): 4.00 cm     TR Peak grad:   37.2 mmHg MV E velocity: 128.00 cm/s  TR Vmax:        305.00 cm/s MV A velocity: 114.00 cm/s MV E/A ratio:  1.12         SHUNTS                             Systemic VTI:  0.24 m                             Systemic Diam: 2.20 cm Arvilla Meres MD Electronically signed by Arvilla Meres MD Signature Date/Time: 01/09/2023/12:25:24 PM    Final    CT Head Wo Contrast Result Date: 01/09/2023 CLINICAL DATA:  Headache. EXAM: CT HEAD WITHOUT CONTRAST TECHNIQUE: Contiguous axial images were obtained from the base of the skull through the vertex without intravenous contrast. RADIATION DOSE REDUCTION: This exam was performed according to the departmental dose-optimization program which includes automated exposure control, adjustment of the mA and/or kV according to patient size and/or use of iterative reconstruction technique. COMPARISON:  CT head 07/30/2022. FINDINGS: Brain: No evidence of acute infarction, hemorrhage, hydrocephalus, extra-axial collection or mass lesion/mass effect. Vascular: No hyperdense vessel or unexpected calcification. Skull: Normal. Negative for fracture or focal lesion. Sinuses/Orbits: No acute finding. Other: None. IMPRESSION: No acute intracranial abnormality. Electronically Signed   By: Darliss Cheney M.D.   On: 01/09/2023 01:23   CT Angio Chest PE W and/or Wo Contrast Result Date: 01/08/2023 CLINICAL DATA:  Hypoxia and shortness of breath EXAM: CT ANGIOGRAPHY CHEST WITH CONTRAST TECHNIQUE: Multidetector CT imaging of the chest was performed using the standard protocol during bolus administration of intravenous contrast. Multiplanar CT image reconstructions  and MIPs were obtained to evaluate the vascular anatomy. RADIATION DOSE REDUCTION: This exam was performed according to the departmental dose-optimization program which includes automated exposure control, adjustment of the mA and/or kV according to patient size and/or use of iterative reconstruction technique. CONTRAST:  75mL OMNIPAQUE IOHEXOL 350 MG/ML SOLN COMPARISON:  Same day chest radiograph and CT chest 05/01/2022 FINDINGS: Cardiovascular: Negative for acute pulmonary embolism. No pericardial effusion. Coronary artery and aortic atherosclerotic calcification. Mediastinum/Nodes: Trachea and esophagus are unremarkable. Unchanged prominent right hilar and subcarinal lymph node measuring  up to 1.0 cm. Lungs/Pleura: Interlobular septal thickening patchy ground-glass opacities greatest in the lower lungs. Bronchial wall thickening with mucous plugging. Centrilobular micro nodules and abutting tree opacities in the lower lungs. Small right and trace left pleural effusions. No pneumothorax. Upper Abdomen: No acute abnormality. Musculoskeletal: No acute fracture. Review of the MIP images confirms the above findings. IMPRESSION: 1. Negative for acute pulmonary embolism. 2. Airway inflammation/infection and/or aspiration greatest in the lower lobes. Small bilateral pleural effusions. Aortic Atherosclerosis (ICD10-I70.0). Electronically Signed   By: Minerva Fester M.D.   On: 01/08/2023 22:29   DG Chest Portable 1 View Result Date: 01/08/2023 CLINICAL DATA:  Shortness of breath. EXAM: PORTABLE CHEST 1 VIEW COMPARISON:  Chest radiographs 10/25/2022 and 07/30/2022; CT chest 05/01/2022 FINDINGS: Cardiac silhouette is again mildly enlarged. Trace pericardial fluid was seen on 05/01/2022 chest CT. Chroniclower lung reticular opacities are similar to 10/25/2022. No definite acute airspace opacity. No pleural effusion pneumothorax. No significant skeletal abnormality. IMPRESSION: 1. Chronic lower lung reticular opacities  are similar to 10/25/2022 radiograph and 05/01/2022 CT, likely chronic lung disease. No definite acute airspace opacity. 2. Mild cardiomegaly. Electronically Signed   By: Neita Garnet M.D.   On: 01/08/2023 20:51   Medications:  azithromycin 500 mg (01/10/23 0901)   cefTRIAXone (ROCEPHIN)  IV 2 g (01/09/23 2230)    amLODipine  10 mg Oral Daily   aspirin  81 mg Oral Daily   calcitRIOL  1 mcg Oral Q M,W,F-HD   Chlorhexidine Gluconate Cloth  6 each Topical Q0600   cinacalcet  60 mg Oral Once per day on Monday Wednesday Friday   doxazosin  8 mg Oral QHS   hydrALAZINE  10 mg Intravenous Once   hydrALAZINE  100 mg Oral TID   isosorbide mononitrate  120 mg Oral QHS   lanthanum  1,000 mg Oral TID with meals   metoprolol tartrate  100 mg Oral BID   mometasone-formoterol  2 puff Inhalation BID   multivitamin  1 tablet Oral Daily   nitroGLYCERIN  1 inch Topical Q6H   pantoprazole  40 mg Oral BID AC   pravastatin  40 mg Oral q1800   sevelamer carbonate  1,600 mg Oral TID with meals    Dialysis Orders: MWF - [facility] 4:15hr, 450/800, EDW 90kg, 2K/2Ca bath, AVF, heparin 2500 unit bolus - Mircera IV q 2 weeks (last given 12/13, due 12/27) - Calcitriol 1.53mcg PO q HD - Sensipar 60mg  q HD  Assessment/Plan: AHRF/pneumonia + pulm edema: On Ceftriaxone + azithromycin. ESRD: Continue HD per usual MWF -> next tomorrow. HTN/volume: BP good, continue to try to challenge EDW as tolerated. 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 Secondary HPTH:Ca ok, Phos high - continue home meds. Nutrition: Alb low, adding supplements HFpEF   Ozzie Hoyle, PA-C 01/10/2023, 11:48 AM  [facility]

## 2023-01-10 NOTE — Progress Notes (Signed)
   01/10/23 2110  BiPAP/CPAP/SIPAP  BiPAP/CPAP/SIPAP Pt Type Adult  BiPAP/CPAP/SIPAP Resmed  Mask Type Full face mask  Mask Size Medium  IPAP 12 cmH20  EPAP 6 cmH2O  FiO2 (%) 21 %  Patient Home Equipment No

## 2023-01-10 NOTE — Progress Notes (Signed)
PROGRESS NOTE    [name]  UJW:[id number] DOB: [birth year] DOA: 01/08/2023 PCP: Caesar Bookman, NP  62 year old gentleman history of [place] syndrome resulting in ESRD, hypertension, OSA, chronic pancytopenia, NICM with recovered EF as of 2023 presented to the ED with a 2 to 3-week history of worsening shortness of breath while being compliant with hemodialysis and noted to be being dialyzed to below dry weight with progression of symptoms. Patient noted with a cough of clear sputum. Patient presented to the ED noted to be in acute hypoxic respiratory failure, placed on BiPAP, noted to also be in hypertensive emergency, underwent hemodialysis with clinical improvement. Also placed empirically on antibiotics due to concern for community-acquired pneumonia.    Subjective: Feels better, still coughing, breathing not back to baseline  Assessment and Plan:  Acute hypoxic respiratory failure (HCC) Multifactorial, combination of volume overload as well as?  Aspiration pneumonia -Volume removal with HD -Continue ceftriaxone and azithromycin today, check SLP eval  Hypertensive emergency -Improving, continue home meds  Suspected aspiration pneumonia -As above  ESRD on dialysis [facility]) Per nephrology, dialyzed yesterday  Thrombocytopenia (HCC) -Chronic, appears to be improving  OSA on CPAP CPAP nightly  DVT prophylaxis: Add heparin subcu Code Status: Full code Family Communication: None present Disposition Plan: Home tomorrow  Consultants:    Procedures:   Antimicrobials:    Objective: Vitals:   01/10/23 0330 01/10/23 0749 01/10/23 0821 01/10/23 1051  BP: 125/75 137/78 (!) 134/54 (!) 127/55  Pulse: 61 70  62  Resp: 19 15  18   Temp: 97.6 F (36.4 C) 97.7 F (36.5 C)  97.8 F (36.6 C)  TempSrc: Axillary Oral  Oral  SpO2: 98% 95%  100%  Weight: 90.3 kg       Intake/Output Summary (Last 24 hours) at 01/10/2023 1147 Last data filed at 01/10/2023 0819 Gross per 24  hour  Intake 180 ml  Output --  Net 180 ml   Filed Weights   01/10/23 0330  Weight: 90.3 kg    Examination:  General exam: AAOx3 HEENT: No JVD CVS: S1-S2, regular rhythm Lungs: Few Rales at the right base Abdomen: Soft, nontender, bowel sounds present Extremities: No edema, left arm AV fistula     Data Reviewed:   CBC: Recent Labs  Lab 01/08/23 1918 01/08/23 1923 01/08/23 2127 01/09/23 0921 01/10/23 0804  WBC  --  1.4* 3.8* 2.9* 3.2*  NEUTROABS  --  1.0* 2.4  --  2.1  HGB 9.9* 17.1* 8.5* 8.4* 8.3*  HCT 29.0* 53.9* 26.3* 26.0* 24.8*  MCV  --  99.6 101.5* 100.4* 98.4  PLT  --  38* 98* 191 152   Basic Metabolic Panel: Recent Labs  Lab 01/08/23 1918 01/08/23 1923 01/09/23 1048 01/10/23 0212  NA 141 142 139 137  K 4.1 4.1 3.3* 4.3  CL  --  103 98 100  CO2  --  24 27 20*  GLUCOSE  --  117* 127* 142*  BUN  --  30* 14 29*  CREATININE  --  10.40* 5.97* 8.01*  CALCIUM  --  9.0 9.2 8.7*  PHOS  --   --  4.7* 7.5*   GFR: Estimated Creatinine Clearance: 10.5 mL/min (A) (by C-G formula based on SCr of 8.01 mg/dL (H)). Liver Function Tests: Recent Labs  Lab 01/08/23 1923 01/10/23 0212  AST 13*  --   ALT 13  --   ALKPHOS 63  --   BILITOT 0.7  --   PROT 6.7  --  ALBUMIN 3.6 2.9*   No results for input(s): "LIPASE", "AMYLASE" in the last 168 hours. No results for input(s): "AMMONIA" in the last 168 hours. Coagulation Profile: No results for input(s): "INR", "PROTIME" in the last 168 hours. Cardiac Enzymes: No results for input(s): "CKTOTAL", "CKMB", "CKMBINDEX", "TROPONINI" in the last 168 hours. BNP (last 3 results) No results for input(s): "PROBNP" in the last 8760 hours. HbA1C: No results for input(s): "HGBA1C" in the last 72 hours. CBG: No results for input(s): "GLUCAP" in the last 168 hours. Lipid Profile: No results for input(s): "CHOL", "HDL", "LDLCALC", "TRIG", "CHOLHDL", "LDLDIRECT" in the last 72 hours. Thyroid Function Tests: No results  for input(s): "TSH", "T4TOTAL", "FREET4", "T3FREE", "THYROIDAB" in the last 72 hours. Anemia Panel: No results for input(s): "VITAMINB12", "FOLATE", "FERRITIN", "TIBC", "IRON", "RETICCTPCT" in the last 72 hours. Urine analysis:    Component Value Date/Time   COLORURINE YELLOW 02/19/2017 1520   APPEARANCEUR HAZY (A) 02/19/2017 1520   LABSPEC 1.012 02/19/2017 1520   PHURINE 8.0 02/19/2017 1520   GLUCOSEU 50 (A) 02/19/2017 1520   HGBUR MODERATE (A) 02/19/2017 1520   BILIRUBINUR NEGATIVE 02/19/2017 1520   KETONESUR NEGATIVE 02/19/2017 1520   PROTEINUR >=300 (A) 02/19/2017 1520   UROBILINOGEN 0.2 04/06/2014 1030   NITRITE NEGATIVE 02/19/2017 1520   LEUKOCYTESUR MODERATE (A) 02/19/2017 1520   Sepsis Labs: @LABRCNTIP (procalcitonin:4,lacticidven:4)  ) Recent Results (from the past 240 hours)  SARS Coronavirus 2 by RT PCR (hospital order, performed in [facility] hospital lab) *cepheid single result test* Anterior Nasal Swab     Status: None   Collection Time: 01/09/23 12:08 AM   Specimen: Anterior Nasal Swab  Result Value Ref Range Status   SARS Coronavirus 2 by RT PCR NEGATIVE NEGATIVE Final    Comment: Performed at [facility], [address] [place], [place] [address]  MRSA Next Gen by PCR, Nasal     Status: None   Collection Time: 01/09/23  8:18 PM   Specimen: Nasal Mucosa; Nasal Swab  Result Value Ref Range Status   MRSA by PCR Next Gen NOT DETECTED NOT DETECTED Final    Comment: (NOTE) The GeneXpert MRSA Assay (FDA approved for NASAL specimens only), is one component of a comprehensive MRSA colonization surveillance program. It is not intended to diagnose MRSA infection nor to guide or monitor treatment for MRSA infections. Test performance is not FDA approved in patients less than 46 years old. Performed at [facility], [name], [place], [place] [address]      Radiology Studies: ECHOCARDIOGRAM COMPLETE Result Date: 01/09/2023    ECHOCARDIOGRAM  REPORT   Patient Name:   Kirk [name] Date of Exam: 01/09/2023 Medical Rec #:  [id number]       Height:       67.0 in Accession #:    [id number]      Weight:       206.4 lb Date of Birth:  [birth year]        BSA:          2.049 m Patient Age:    62 years        BP:           170/51 mmHg Patient Gender: M               HR:           73 bpm. Exam Location:  Inpatient Procedure: 2D Echo, Cardiac Doppler and Color Doppler Indications:    CHF  History:        Patient has prior history of Echocardiogram examinations, most                 recent 11/16/2021. Risk Factors:Hypertension and Diabetes.  Sonographer:    Webb Laws Referring Phys: [id number] JARED M GARDNER IMPRESSIONS  1. Left ventricular ejection fraction, by estimation, is 50 to 55%. The left ventricle has low normal function. The left ventricle has no regional wall motion abnormalities. Left ventricular diastolic parameters are consistent with Grade II diastolic dysfunction (pseudonormalization).  2. Right ventricular systolic function is normal. The right ventricular size is normal.  3. The mitral valve is normal in structure. Trivial mitral valve regurgitation. No evidence of mitral stenosis.  4. The aortic valve is tricuspid. There is moderate calcification of the aortic valve. Aortic valve regurgitation is not visualized. Moderate aortic valve stenosis.  5. The inferior vena cava is dilated in size with >50% respiratory variability, suggesting right atrial pressure of 8 mmHg. FINDINGS  Left Ventricle: Left ventricular ejection fraction, by estimation, is 50 to 55%. The left ventricle has low normal function. The left ventricle has no regional wall motion abnormalities. The left ventricular internal cavity size was normal in size. There is no left ventricular hypertrophy. Left ventricular diastolic parameters are consistent with Grade II diastolic dysfunction (pseudonormalization). Right Ventricle: The right ventricular size is normal. No increase in right  ventricular wall thickness. Right ventricular systolic function is normal. Left Atrium: Left atrial size was normal in size. Right Atrium: Right atrial size was normal in size. Pericardium: There is no evidence of pericardial effusion. Mitral Valve: The mitral valve is normal in structure. Mild mitral annular calcification. Trivial mitral valve regurgitation. No evidence of mitral valve stenosis. Tricuspid Valve: The tricuspid valve is normal in structure. Tricuspid valve regurgitation is trivial. No evidence of tricuspid stenosis. Aortic Valve: The aortic valve is tricuspid. There is moderate calcification of the aortic valve. Aortic valve regurgitation is not visualized. Moderate aortic stenosis is present. Aortic valve mean gradient measures 18.8 mmHg. Aortic valve peak gradient  measures 33.6 mmHg. Aortic valve area, by VTI measures 1.47 cm. Pulmonic Valve: The pulmonic valve was normal in structure. Pulmonic valve regurgitation is trivial. No evidence of pulmonic stenosis. Aorta: The aortic root is normal in size and structure. Venous: The inferior vena cava is dilated in size with greater than 50% respiratory variability, suggesting right atrial pressure of 8 mmHg. IAS/Shunts: No atrial level shunt detected by color flow Doppler.  LEFT VENTRICLE PLAX 2D LVIDd:         6.20 cm      Diastology LVIDs:         3.80 cm      LV e' medial:    4.57 cm/s LV PW:         1.10 cm      LV E/e' medial:  28.0 LV IVS:        1.00 cm      LV e' lateral:   5.77 cm/s LVOT diam:     2.20 cm      LV E/e' lateral: 22.2 LV SV:         92 LV SV Index:   45 LVOT Area:     3.80 cm  LV Volumes (MOD) LV vol d, MOD A2C: 172.0 ml LV vol d, MOD A4C: 156.0 ml LV vol s, MOD A2C: 81.5 ml LV vol s, MOD A4C: 75.7 ml LV SV MOD A2C:  90.5 ml LV SV MOD A4C:     156.0 ml LV SV MOD BP:      82.9 ml RIGHT VENTRICLE             IVC RV Basal diam:  4.60 cm     IVC diam: 2.20 cm RV Mid diam:    2.90 cm RV S prime:     13.20 cm/s TAPSE (M-mode): 2.8  cm LEFT ATRIUM             Index        RIGHT ATRIUM           Index LA diam:        4.10 cm 2.00 cm/m   RA Area:     18.80 cm LA Vol (A2C):   72.6 ml 35.42 ml/m  RA Volume:   53.30 ml  26.01 ml/m LA Vol (A4C):   77.4 ml 37.77 ml/m LA Biplane Vol: 78.7 ml 38.40 ml/m  AORTIC VALVE AV Area (Vmax):    1.49 cm AV Area (Vmean):   1.44 cm AV Area (VTI):     1.47 cm AV Vmax:           290.00 cm/s AV Vmean:          205.200 cm/s AV VTI:            0.630 m AV Peak Grad:      33.6 mmHg AV Mean Grad:      18.8 mmHg LVOT Vmax:         114.00 cm/s LVOT Vmean:        77.900 cm/s LVOT VTI:          0.243 m LVOT/AV VTI ratio: 0.39  AORTA Ao Root diam: 3.00 cm Ao Asc diam:  3.60 cm MITRAL VALVE                TRICUSPID VALVE MV Area (PHT): 4.00 cm     TR Peak grad:   37.2 mmHg MV E velocity: 128.00 cm/s  TR Vmax:        305.00 cm/s MV A velocity: 114.00 cm/s MV E/A ratio:  1.12         SHUNTS                             Systemic VTI:  0.24 m                             Systemic Diam: 2.20 cm Arvilla Meres MD Electronically signed by Arvilla Meres MD Signature Date/Time: 01/09/2023/12:25:24 PM    Final    CT Head Wo Contrast Result Date: 01/09/2023 CLINICAL DATA:  Headache. EXAM: CT HEAD WITHOUT CONTRAST TECHNIQUE: Contiguous axial images were obtained from the base of the skull through the vertex without intravenous contrast. RADIATION DOSE REDUCTION: This exam was performed according to the departmental dose-optimization program which includes automated exposure control, adjustment of the mA and/or kV according to patient size and/or use of iterative reconstruction technique. COMPARISON:  CT head 07/30/2022. FINDINGS: Brain: No evidence of acute infarction, hemorrhage, hydrocephalus, extra-axial collection or mass lesion/mass effect. Vascular: No hyperdense vessel or unexpected calcification. Skull: Normal. Negative for fracture or focal lesion. Sinuses/Orbits: No acute finding. Other: None. IMPRESSION: No acute  intracranial abnormality. Electronically Signed   By: Darliss Cheney M.D.   On: 01/09/2023 01:23   CT Angio Chest PE  W and/or Wo Contrast Result Date: 01/08/2023 CLINICAL DATA:  Hypoxia and shortness of breath EXAM: CT ANGIOGRAPHY CHEST WITH CONTRAST TECHNIQUE: Multidetector CT imaging of the chest was performed using the standard protocol during bolus administration of intravenous contrast. Multiplanar CT image reconstructions and MIPs were obtained to evaluate the vascular anatomy. RADIATION DOSE REDUCTION: This exam was performed according to the departmental dose-optimization program which includes automated exposure control, adjustment of the mA and/or kV according to patient size and/or use of iterative reconstruction technique. CONTRAST:  75mL OMNIPAQUE IOHEXOL 350 MG/ML SOLN COMPARISON:  Same day chest radiograph and CT chest 05/01/2022 FINDINGS: Cardiovascular: Negative for acute pulmonary embolism. No pericardial effusion. Coronary artery and aortic atherosclerotic calcification. Mediastinum/Nodes: Trachea and esophagus are unremarkable. Unchanged prominent right hilar and subcarinal lymph node measuring up to 1.0 cm. Lungs/Pleura: Interlobular septal thickening patchy ground-glass opacities greatest in the lower lungs. Bronchial wall thickening with mucous plugging. Centrilobular micro nodules and abutting tree opacities in the lower lungs. Small right and trace left pleural effusions. No pneumothorax. Upper Abdomen: No acute abnormality. Musculoskeletal: No acute fracture. Review of the MIP images confirms the above findings. IMPRESSION: 1. Negative for acute pulmonary embolism. 2. Airway inflammation/infection and/or aspiration greatest in the lower lobes. Small bilateral pleural effusions. Aortic Atherosclerosis (ICD10-I70.0). Electronically Signed   By: Minerva Fester M.D.   On: 01/08/2023 22:29   DG Chest Portable 1 View Result Date: 01/08/2023 CLINICAL DATA:  Shortness of breath. EXAM:  PORTABLE CHEST 1 VIEW COMPARISON:  Chest radiographs 10/25/2022 and 07/30/2022; CT chest 05/01/2022 FINDINGS: Cardiac silhouette is again mildly enlarged. Trace pericardial fluid was seen on 05/01/2022 chest CT. Chroniclower lung reticular opacities are similar to 10/25/2022. No definite acute airspace opacity. No pleural effusion pneumothorax. No significant skeletal abnormality. IMPRESSION: 1. Chronic lower lung reticular opacities are similar to 10/25/2022 radiograph and 05/01/2022 CT, likely chronic lung disease. No definite acute airspace opacity. 2. Mild cardiomegaly. Electronically Signed   By: Neita Garnet M.D.   On: 01/08/2023 20:51     Scheduled Meds:  amLODipine  10 mg Oral Daily   aspirin  81 mg Oral Daily   calcitRIOL  1 mcg Oral Q M,W,F-HD   Chlorhexidine Gluconate Cloth  6 each Topical Q0600   cinacalcet  60 mg Oral Once per day on Monday Wednesday Friday   doxazosin  8 mg Oral QHS   hydrALAZINE  10 mg Intravenous Once   hydrALAZINE  100 mg Oral TID   isosorbide mononitrate  120 mg Oral QHS   lanthanum  1,000 mg Oral TID with meals   metoprolol tartrate  100 mg Oral BID   mometasone-formoterol  2 puff Inhalation BID   multivitamin  1 tablet Oral Daily   nitroGLYCERIN  1 inch Topical Q6H   pantoprazole  40 mg Oral BID AC   pravastatin  40 mg Oral q1800   sevelamer carbonate  1,600 mg Oral TID with meals   Continuous Infusions:  azithromycin 500 mg (01/10/23 0901)   cefTRIAXone (ROCEPHIN)  IV 2 g (01/09/23 2230)     LOS: 1 day    Time spent:    Zannie Cove, MD [facility]   01/10/2023, 11:47 AM

## 2023-01-10 NOTE — Progress Notes (Signed)
Heart Failure Navigator Progress Note  Assessed for Heart & Vascular TOC clinic readiness.  Patient does not meet criteria due to ESRD on hemodialysis.   Navigator will sign off at this time.    Dawn Fields, BSN, RN Heart Failure Nurse Navigator Secure Chat Only   

## 2023-01-11 ENCOUNTER — Other Ambulatory Visit (HOSPITAL_COMMUNITY): Payer: Self-pay

## 2023-01-11 DIAGNOSIS — J9601 Acute respiratory failure with hypoxia: Secondary | ICD-10-CM | POA: Diagnosis not present

## 2023-01-11 LAB — CBC
HCT: 24.7 % — ABNORMAL LOW (ref 39.0–52.0)
Hemoglobin: 7.9 g/dL — ABNORMAL LOW (ref 13.0–17.0)
MCH: 31.9 pg (ref 26.0–34.0)
MCHC: 32 g/dL (ref 30.0–36.0)
MCV: 99.6 fL (ref 80.0–100.0)
Platelets: 87 10*3/uL — ABNORMAL LOW (ref 150–400)
RBC: 2.48 MIL/uL — ABNORMAL LOW (ref 4.22–5.81)
RDW: 14.4 % (ref 11.5–15.5)
WBC: 2.4 10*3/uL — ABNORMAL LOW (ref 4.0–10.5)
nRBC: 0 % (ref 0.0–0.2)

## 2023-01-11 LAB — BASIC METABOLIC PANEL
Anion gap: 13 (ref 5–15)
BUN: 39 mg/dL — ABNORMAL HIGH (ref 6–20)
CO2: 24 mmol/L (ref 22–32)
Calcium: 8.7 mg/dL — ABNORMAL LOW (ref 8.9–10.3)
Chloride: 103 mmol/L (ref 98–111)
Creatinine, Ser: 10.95 mg/dL — ABNORMAL HIGH (ref 0.61–1.24)
GFR, Estimated: 5 mL/min — ABNORMAL LOW (ref >60)
Glucose, Bld: 108 mg/dL — ABNORMAL HIGH (ref 70–99)
Potassium: 4.2 mmol/L (ref 3.5–5.1)
Sodium: 140 mmol/L (ref 135–145)

## 2023-01-11 MED ORDER — CEFDINIR 300 MG PO CAPS
ORAL_CAPSULE | ORAL | 0 refills | Status: DC
  Filled 2023-01-11: qty 3, 3d supply, fill #0

## 2023-01-11 MED ORDER — CEFDINIR 300 MG PO CAPS
300.0000 mg | ORAL_CAPSULE | ORAL | Status: DC
  Administered 2023-01-11: 300 mg via ORAL
  Filled 2023-01-11: qty 1

## 2023-01-11 MED ORDER — CEFDINIR 300 MG PO CAPS
300.0000 mg | ORAL_CAPSULE | ORAL | Status: DC
  Filled 2023-01-11: qty 1

## 2023-01-11 MED ORDER — HEPARIN SODIUM (PORCINE) 1000 UNIT/ML IJ SOLN: 4000.0000 [IU] | Freq: Once | INTRAMUSCULAR | Status: DC

## 2023-01-11 MED ORDER — HEPARIN SODIUM (PORCINE) 1000 UNIT/ML IJ SOLN
INTRAMUSCULAR | Status: AC
  Administered 2023-01-11: 1000 [IU]
  Filled 2023-01-11: qty 4

## 2023-01-11 NOTE — Telephone Encounter (Signed)
Thank you :)

## 2023-01-11 NOTE — Progress Notes (Signed)
Kirk [name] Progress Note   Subjective:  Seen on HD - 3.3L UFG and tolerating. Still with DOE, otherwise feeling better.  Objective Vitals:   01/11/23 0534 01/11/23 0558 01/11/23 0700 01/11/23 0730  BP: (!) 128/55  (!) 148/56 (!) 141/60  Pulse: 76 71    Resp: 16 (!) 3    Temp: 98.4 F (36.9 C)     TempSrc: Oral     SpO2: 99% 100%    Weight:      Height:       Physical Exam General: Well appearing man, NAD. Room air. Heart: RRR; no murmur Lungs: CTAB; no rales or wheezing Abdomen: soft Extremities: no LE edema Dialysis Access: L AVF +t/b  Additional Objective Labs: Basic Metabolic Panel: Recent Labs  Lab 01/09/23 1048 01/10/23 0212 01/11/23 0211  NA 139 137 140  K 3.3* 4.3 4.2  CL 98 100 103  CO2 27 20* 24  GLUCOSE 127* 142* 108*  BUN 14 29* 39*  CREATININE 5.97* 8.01* 10.95*  CALCIUM 9.2 8.7* 8.7*  PHOS 4.7* 7.5*  --    Liver Function Tests: Recent Labs  Lab 01/08/23 1923 01/10/23 0212  AST 13*  --   ALT 13  --   ALKPHOS 63  --   BILITOT 0.7  --   PROT 6.7  --   ALBUMIN 3.6 2.9*   CBC: Recent Labs  Lab 01/08/23 1923 01/08/23 2127 01/09/23 0921 01/10/23 0804 01/11/23 0211  WBC 1.4* 3.8* 2.9* 3.2* 2.4*  NEUTROABS 1.0* 2.4  --  2.1  --   HGB 17.1* 8.5* 8.4* 8.3* 7.9*  HCT 53.9* 26.3* 26.0* 24.8* 24.7*  MCV 99.6 101.5* 100.4* 98.4 99.6  PLT 38* 98* 191 152 87*   Blood Culture    Component Value Date/Time   SDES SPUTUM 05/06/2022 2158   SPECREQUEST NONE 05/06/2022 2158   CULT  04/28/2022 2014    NO GROWTH 5 DAYS Performed at [facility], [address], [place], [place] [address]    REPTSTATUS 05/06/2022 FINAL 05/06/2022 2158   Studies/Results: ECHOCARDIOGRAM COMPLETE Result Date: 01/09/2023    ECHOCARDIOGRAM REPORT   Patient Name:   Kirk [name] Date of Exam: 01/09/2023 Medical Rec #:  [id number]       Height:       67.0 in Accession #:    [id number]      Weight:       206.4 lb Date of Birth:  [birth year]         BSA:          2.049 m Patient Age:    62 years        BP:           170/51 mmHg Patient Gender: M               HR:           73 bpm. Exam Location:  Inpatient Procedure: 2D Echo, Cardiac Doppler and Color Doppler Indications:    CHF  History:        Patient has prior history of Echocardiogram examinations, most                 recent 11/16/2021. Risk Factors:Hypertension and Diabetes.  Sonographer:    Webb Laws Referring Phys: 33 JARED M GARDNER IMPRESSIONS  1. Left ventricular ejection fraction, by estimation, is 50 to 55%. The left ventricle has low normal function. The left ventricle has no regional wall motion abnormalities. Left  ventricular diastolic parameters are consistent with Grade II diastolic dysfunction (pseudonormalization).  2. Right ventricular systolic function is normal. The right ventricular size is normal.  3. The mitral valve is normal in structure. Trivial mitral valve regurgitation. No evidence of mitral stenosis.  4. The aortic valve is tricuspid. There is moderate calcification of the aortic valve. Aortic valve regurgitation is not visualized. Moderate aortic valve stenosis.  5. The inferior vena cava is dilated in size with >50% respiratory variability, suggesting right atrial pressure of 8 mmHg. FINDINGS  Left Ventricle: Left ventricular ejection fraction, by estimation, is 50 to 55%. The left ventricle has low normal function. The left ventricle has no regional wall motion abnormalities. The left ventricular internal cavity size was normal in size. There is no left ventricular hypertrophy. Left ventricular diastolic parameters are consistent with Grade II diastolic dysfunction (pseudonormalization). Right Ventricle: The right ventricular size is normal. No increase in right ventricular wall thickness. Right ventricular systolic function is normal. Left Atrium: Left atrial size was normal in size. Right Atrium: Right atrial size was normal in size. Pericardium: There is no  evidence of pericardial effusion. Mitral Valve: The mitral valve is normal in structure. Mild mitral annular calcification. Trivial mitral valve regurgitation. No evidence of mitral valve stenosis. Tricuspid Valve: The tricuspid valve is normal in structure. Tricuspid valve regurgitation is trivial. No evidence of tricuspid stenosis. Aortic Valve: The aortic valve is tricuspid. There is moderate calcification of the aortic valve. Aortic valve regurgitation is not visualized. Moderate aortic stenosis is present. Aortic valve mean gradient measures 18.8 mmHg. Aortic valve peak gradient  measures 33.6 mmHg. Aortic valve area, by VTI measures 1.47 cm. Pulmonic Valve: The pulmonic valve was normal in structure. Pulmonic valve regurgitation is trivial. No evidence of pulmonic stenosis. Aorta: The aortic root is normal in size and structure. Venous: The inferior vena cava is dilated in size with greater than 50% respiratory variability, suggesting right atrial pressure of 8 mmHg. IAS/Shunts: No atrial level shunt detected by color flow Doppler.  LEFT VENTRICLE PLAX 2D LVIDd:         6.20 cm      Diastology LVIDs:         3.80 cm      LV e' medial:    4.57 cm/s LV PW:         1.10 cm      LV E/e' medial:  28.0 LV IVS:        1.00 cm      LV e' lateral:   5.77 cm/s LVOT diam:     2.20 cm      LV E/e' lateral: 22.2 LV SV:         92 LV SV Index:   45 LVOT Area:     3.80 cm  LV Volumes (MOD) LV vol d, MOD A2C: 172.0 ml LV vol d, MOD A4C: 156.0 ml LV vol s, MOD A2C: 81.5 ml LV vol s, MOD A4C: 75.7 ml LV SV MOD A2C:     90.5 ml LV SV MOD A4C:     156.0 ml LV SV MOD BP:      82.9 ml RIGHT VENTRICLE             IVC RV Basal diam:  4.60 cm     IVC diam: 2.20 cm RV Mid diam:    2.90 cm RV S prime:     13.20 cm/s TAPSE (M-mode): 2.8 cm LEFT ATRIUM  Index        RIGHT ATRIUM           Index LA diam:        4.10 cm 2.00 cm/m   RA Area:     18.80 cm LA Vol (A2C):   72.6 ml 35.42 ml/m  RA Volume:   53.30 ml  26.01 ml/m  LA Vol (A4C):   77.4 ml 37.77 ml/m LA Biplane Vol: 78.7 ml 38.40 ml/m  AORTIC VALVE AV Area (Vmax):    1.49 cm AV Area (Vmean):   1.44 cm AV Area (VTI):     1.47 cm AV Vmax:           290.00 cm/s AV Vmean:          205.200 cm/s AV VTI:            0.630 m AV Peak Grad:      33.6 mmHg AV Mean Grad:      18.8 mmHg LVOT Vmax:         114.00 cm/s LVOT Vmean:        77.900 cm/s LVOT VTI:          0.243 m LVOT/AV VTI ratio: 0.39  AORTA Ao Root diam: 3.00 cm Ao Asc diam:  3.60 cm MITRAL VALVE                TRICUSPID VALVE MV Area (PHT): 4.00 cm     TR Peak grad:   37.2 mmHg MV E velocity: 128.00 cm/s  TR Vmax:        305.00 cm/s MV A velocity: 114.00 cm/s MV E/A ratio:  1.12         SHUNTS                             Systemic VTI:  0.24 m                             Systemic Diam: 2.20 cm Arvilla Meres MD Electronically signed by Arvilla Meres MD Signature Date/Time: 01/09/2023/12:25:24 PM    Final    Medications:  azithromycin 500 mg (01/10/23 0901)   cefTRIAXone (ROCEPHIN)  IV 2 g (01/10/23 2112)    (feeding supplement) PROSource Plus  30 mL Oral BID BM   amLODipine  10 mg Oral Daily   aspirin  81 mg Oral Daily   calcitRIOL  1 mcg Oral Q M,W,F-HD   Chlorhexidine Gluconate Cloth  6 each Topical Q0600   cinacalcet  60 mg Oral Once per day on Monday Wednesday Friday   doxazosin  8 mg Oral QHS   heparin injection (subcutaneous)  5,000 Units Subcutaneous Q8H   heparin sodium (porcine)  4,000 Units Intracatheter Once   hydrALAZINE  10 mg Intravenous Once   hydrALAZINE  100 mg Oral TID   isosorbide mononitrate  120 mg Oral QHS   lanthanum  1,000 mg Oral TID with meals   metoprolol tartrate  100 mg Oral BID   mometasone-formoterol  2 puff Inhalation BID   multivitamin  1 tablet Oral Daily   nitroGLYCERIN  1 inch Topical Q6H   pantoprazole  40 mg Oral BID AC   pravastatin  40 mg Oral q1800   sevelamer carbonate  1,600 mg Oral TID with meals    Dialysis Orders: MWF - [facility] 4:15hr, 450/800,  EDW 90kg, 2K/2Ca bath, AVF, heparin 2500 unit  bolus - Mircera IV q 2 weeks (last given 12/13, due 12/27) - Calcitriol 1.34mcg PO q HD - Sensipar 60mg  q HD   Assessment/Plan: AHRF/pneumonia + pulm edema: On Ceftriaxone + azithromycin. Leukopenia noted. ESRD: Continue HD per usual MWF -> HD now, 3.3L UFG. HTN/volume: BP good, continue to try to challenge EDW as tolerated. Anemia of ESRD: Hgb drifting down, 7.9 today - not due for ESA yet. Secondary HPTH:Ca ok, Phos high - continue home meds. Nutrition: Alb low, continue supplements HFpEF   Ozzie Hoyle, PA-C 01/11/2023, 8:46 AM  [employer]

## 2023-01-11 NOTE — Discharge Summary (Signed)
Physician Discharge Summary  Kirk [name] XBJ:[id number] DOB: 02-26-1960 DOA: 01/08/2023  PCP: Caesar Bookman, NP  Admit date: 01/08/2023 Discharge date: 01/11/2023  Time spent: 45 minutes  Recommendations for Outpatient Follow-up:  PCP in 1 week, chest x-ray in 6 weeks   Discharge Diagnoses:  Principal Problem:   Acute hypoxic respiratory failure (HCC) Active Problems:   Community acquired bilateral lower lobe pneumonia   Hypertensive emergency   OSA on CPAP   Thrombocytopenia (HCC)   ESRD on dialysis [facility])   Essential hypertension   Pancytopenia (HCC)   Acute on chronic diastolic CHF (congestive heart failure) (HCC)   Discharge Condition: Improved  Diet recommendation: Renal  Filed Weights   01/10/23 0330 01/11/23 0338 01/11/23 1026  Weight: 90.3 kg 93.5 kg 90.2 kg    History of present illness:  62 year old gentleman history of [place] syndrome resulting in ESRD, hypertension, OSA, chronic pancytopenia, NICM with recovered EF as of 2023 presented to the ED with a 2 to 3-week history of worsening shortness of breath while being compliant with hemodialysis and noted to be being dialyzed to below dry weight with progression of symptoms. Patient noted with a cough of clear sputum. Patient presented to the ED noted to be in acute hypoxic respiratory failure, placed on BiPAP, noted to also be in hypertensive emergency, underwent hemodialysis with clinical improvement. Also placed empirically on antibiotics due to concern for community-acquired pneumonia.   Hospital Course:   Acute hypoxic respiratory failure (HCC) Multifactorial, combination of volume overload as well as?  Aspiration pneumonia -Improved with antibiotics as well as volume removal with HD -Weaned off O2 -Transition to oral cefdinir to complete 5 days of antibiotics, discharge home after SLP eval today   Hypertensive emergency -Improving, continue home meds   Suspected aspiration pneumonia -As above    ESRD on dialysis [facility]) Per nephrology, dialyzed yesterday   Thrombocytopenia (HCC) -Chronic, appears to be improving   OSA on CPAP CPAP nightly  Discharge Exam: Vitals:   01/11/23 1020 01/11/23 1048  BP:  (!) 150/78  Pulse:  79  Resp:  20  Temp: 98.5 F (36.9 C) 98.5 F (36.9 C)  SpO2:  96%   Gen: Awake, Alert, Oriented X 3,  HEENT: no JVD Lungs: Good air movement bilaterally, CTAB CVS: S1S2/RRR Abd: soft, Non tender, non distended, BS present Extremities: No edema Skin: no new rashes on exposed skin   Discharge Instructions    Allergies as of 01/11/2023       Reactions   Codeine Nausea And Vomiting   Hydrocodone-acetaminophen Nausea And Vomiting        Medication List     STOP taking these medications    amoxicillin 500 MG tablet Commonly known as: AMOXIL       TAKE these medications    acetaminophen 500 MG tablet Commonly known as: TYLENOL Take 1 tablet (500 mg total) by mouth every 6 (six) hours as needed for moderate pain.   albuterol 108 (90 Base) MCG/ACT inhaler Commonly known as: VENTOLIN HFA Inhale 2 puffs into the lungs every 6 (six) hours as needed for wheezing or shortness of breath.   albuterol (2.5 MG/3ML) 0.083% nebulizer solution Commonly known as: PROVENTIL Take 3 mLs (2.5 mg total) by nebulization every 6 (six) hours as needed for wheezing or shortness of breath.   amLODipine 10 MG tablet Commonly known as: NORVASC TAKE 1 TABLET BY MOUTH EVERY DAY   aspirin 81 MG chewable tablet Chew 81 mg by mouth daily.  calcitRIOL 0.5 MCG capsule Commonly known as: ROCALTROL Take 2 capsules (1 mcg total) by mouth every Monday, Wednesday, and Friday with hemodialysis.   cefdinir 300 MG capsule Commonly known as: OMNICEF Take 1 tab (300 mg) on Sunday, Tuesday and Friday   cinacalcet 30 MG tablet Commonly known as: SENSIPAR Take 2 tablets (60 mg total) by mouth 3 (three) times a week.   diphenhydrAMINE 25 mg capsule Commonly  known as: BENADRYL Take 25 mg by mouth every 6 (six) hours as needed for allergies.   doxazosin 8 MG tablet Commonly known as: CARDURA Take 1 tablet (8 mg total) by mouth daily. What changed: when to take this   fluticasone-salmeterol 115-21 MCG/ACT inhaler Commonly known as: Advair HFA Inhale 2 puffs into the lungs 2 (two) times daily.   Fosrenol 1000 MG Pack Generic drug: Lanthanum Carbonate Take 1 packet by mouth with breakfast, with lunch, and with evening meal. Also take when eating snacks   guaiFENesin-dextromethorphan 100-10 MG/5ML syrup Commonly known as: ROBITUSSIN DM Take 5 mLs by mouth every 4 (four) hours as needed for cough.   hydrALAZINE 100 MG tablet Commonly known as: APRESOLINE Take 1 tablet (100 mg total) by mouth 3 (three) times daily.   isosorbide mononitrate 120 MG 24 hr tablet Commonly known as: IMDUR Take 1 tablet (120 mg total) by mouth daily. What changed: when to take this   metoprolol tartrate 100 MG tablet Commonly known as: LOPRESSOR TAKE 1 TABLET BY MOUTH TWICE A DAY   multivitamin Tabs tablet Take 1 tablet by mouth daily.   ondansetron 4 MG disintegrating tablet Commonly known as: ZOFRAN-ODT TAKE 1 TABLET BY MOUTH EVERY 8 HOURS AS NEEDED FOR NAUSEA AND VOMITING   pantoprazole 40 MG tablet Commonly known as: PROTONIX Take 1 tablet (40 mg total) by mouth 2 (two) times daily before a meal.   pravastatin 40 MG tablet Commonly known as: PRAVACHOL TAKE 1 TABLET BY MOUTH EVERY DAY IN THE EVENING   sevelamer carbonate 800 MG tablet Commonly known as: Renvela Take 2 tablets (1,600 mg total) by mouth as needed (snacks). What changed:  when to take this additional instructions   traZODone 50 MG tablet Commonly known as: DESYREL Take 0.5-1 tablets (25-50 mg total) by mouth at bedtime as needed. for sleep What changed:  how much to take reasons to take this   Vitamin D3 25 MCG (1000 UT) Caps Take 4,000 Units by mouth daily. Take at home  on Tues, Thurs, Sat, and Sun       Allergies  Allergen Reactions   Codeine Nausea And Vomiting   Hydrocodone-Acetaminophen Nausea And Vomiting      The results of significant diagnostics from this hospitalization (including imaging, microbiology, ancillary and laboratory) are listed below for reference.    Significant Diagnostic Studies: ECHOCARDIOGRAM COMPLETE Result Date: 01/09/2023    ECHOCARDIOGRAM REPORT   Patient Name:   Kirk [name] Date of Exam: 01/09/2023 Medical Rec #:  [id number]       Height:       67.0 in Accession #:    [id number]      Weight:       206.4 lb Date of Birth:  04/26/1960        BSA:          2.049 m Patient Age:    62 years        BP:           17 0/51 mmHg Patient Gender: M  HR:           73 bpm. Exam Location:  Inpatient Procedure: 2D Echo, Cardiac Doppler and Color Doppler Indications:    CHF  History:        Patient has prior history of Echocardiogram examinations, most                 recent 11/16/2021. Risk Factors:Hypertension and Diabetes.  Sonographer:    Webb Laws Referring Phys: [id number] JARED M GARDNER IMPRESSIONS  1. Left ventricular ejection fraction, by estimation, is 50 to 55%. The left ventricle has low normal function. The left ventricle has no regional wall motion abnormalities. Left ventricular diastolic parameters are consistent with Grade II diastolic dysfunction (pseudonormalization).  2. Right ventricular systolic function is normal. The right ventricular size is normal.  3. The mitral valve is normal in structure. Trivial mitral valve regurgitation. No evidence of mitral stenosis.  4. The aortic valve is tricuspid. There is moderate calcification of the aortic valve. Aortic valve regurgitation is not visualized. Moderate aortic valve stenosis.  5. The inferior vena cava is dilated in size with >50% respiratory variability, suggesting right atrial pressure of 8 mmHg. FINDINGS  Left Ventricle: Left ventricular ejection fraction, by  estimation, is 50 to 55%. The left ventricle has low normal function. The left ventricle has no regional wall motion abnormalities. The left ventricular internal cavity size was normal in size. There is no left ventricular hypertrophy. Left ventricular diastolic parameters are consistent with Grade II diastolic dysfunction (pseudonormalization). Right Ventricle: The right ventricular size is normal. No increase in right ventricular wall thickness. Right ventricular systolic function is normal. Left Atrium: Left atrial size was normal in size. Right Atrium: Right atrial size was normal in size. Pericardium: There is no evidence of pericardial effusion. Mitral Valve: The mitral valve is normal in structure. Mild mitral annular calcification. Trivial mitral valve regurgitation. No evidence of mitral valve stenosis. Tricuspid Valve: The tricuspid valve is normal in structure. Tricuspid valve regurgitation is trivial. No evidence of tricuspid stenosis. Aortic Valve: The aortic valve is tricuspid. There is moderate calcification of the aortic valve. Aortic valve regurgitation is not visualized. Moderate aortic stenosis is present. Aortic valve mean gradient measures 18.8 mmHg. Aortic valve peak gradient  measures 33.6 mmHg. Aortic valve area, by VTI measures 1.47 cm. Pulmonic Valve: The pulmonic valve was normal in structure. Pulmonic valve regurgitation is trivial. No evidence of pulmonic stenosis. Aorta: The aortic root is normal in size and structure. Venous: The inferior vena cava is dilated in size with greater than 50% respiratory variability, suggesting right atrial pressure of 8 mmHg. IAS/Shunts: No atrial level shunt detected by color flow Doppler.  LEFT VENTRICLE PLAX 2D LVIDd:         6.20 cm      Diastology LVIDs:         3.80 cm      LV e' medial:    4.57 cm/s LV PW:         1.10 cm      LV E/e' medial:  28.0 LV IVS:        1.00 cm      LV e' lateral:   5.77 cm/s LVOT diam:     2.20 cm      LV E/e' lateral:  22.2 LV SV:         92 LV SV Index:   45 LVOT Area:     3.80 cm  LV Volumes (MOD) LV vol d,  MOD A2C: 172.0 ml LV vol d, MOD A4C: 156.0 ml LV vol s, MOD A2C: 81.5 ml LV vol s, MOD A4C: 75.7 ml LV SV MOD A2C:     90.5 ml LV SV MOD A4C:     156.0 ml LV SV MOD BP:      82.9 ml RIGHT VENTRICLE             IVC RV Basal diam:  4.60 cm     IVC diam: 2.20 cm RV Mid diam:    2.90 cm RV S prime:     13.20 cm/s TAPSE (M-mode): 2.8 cm LEFT ATRIUM             Index        RIGHT ATRIUM           Index LA diam:        4.10 cm 2.00 cm/m   RA Area:     18.80 cm LA Vol (A2C):   72.6 ml 35.42 ml/m  RA Volume:   53.30 ml  26.01 ml/m LA Vol (A4C):   77.4 ml 37.77 ml/m LA Biplane Vol: 78.7 ml 38.40 ml/m  AORTIC VALVE AV Area (Vmax):    1.49 cm AV Area (Vmean):   1.44 cm AV Area (VTI):     1.47 cm AV Vmax:           290.00 cm/s AV Vmean:          205.200 cm/s AV VTI:            0.630 m AV Peak Grad:      33.6 mmHg AV Mean Grad:      18.8 mmHg LVOT Vmax:         114.00 cm/s LVOT Vmean:        77.900 cm/s LVOT VTI:          0.243 m LVOT/AV VTI ratio: 0.39  AORTA Ao Root diam: 3.00 cm Ao Asc diam:  3.60 cm MITRAL VALVE                TRICUSPID VALVE MV Area (PHT): 4.00 cm     TR Peak grad:   37.2 mmHg MV E velocity: 128.00 cm/s  TR Vmax:        305.00 cm/s MV A velocity: 114.00 cm/s MV E/A ratio:  1.12         SHUNTS                             Systemic VTI:  0.24 m                             Systemic Diam: 2.20 cm Arvilla Meres MD Electronically signed by Arvilla Meres MD Signature Date/Time: 01/09/2023/12:25:24 PM    Final    CT Head Wo Contrast Result Date: 01/09/2023 CLINICAL DATA:  Headache. EXAM: CT HEAD WITHOUT CONTRAST TECHNIQUE: Contiguous axial images were obtained from the base of the skull through the vertex without intravenous contrast. RADIATION DOSE REDUCTION: This exam was performed according to the departmental dose-optimization program which includes automated exposure control, adjustment of the mA  and/or kV according to patient size and/or use of iterative reconstruction technique. COMPARISON:  CT head 07/30/2022. FINDINGS: Brain: No evidence of acute infarction, hemorrhage, hydrocephalus, extra-axial collection or mass lesion/mass effect. Vascular: No hyperdense vessel or unexpected calcification. Skull: Normal. Negative for fracture or focal  lesion. Sinuses/Orbits: No acute finding. Other: None. IMPRESSION: No acute intracranial abnormality. Electronically Signed   By: Darliss Cheney M.D.   On: 01/09/2023 01:23   CT Angio Chest PE W and/or Wo Contrast Result Date: 01/08/2023 CLINICAL DATA:  Hypoxia and shortness of breath EXAM: CT ANGIOGRAPHY CHEST WITH CONTRAST TECHNIQUE: Multidetector CT imaging of the chest was performed using the standard protocol during bolus administration of intravenous contrast. Multiplanar CT image reconstructions and MIPs were obtained to evaluate the vascular anatomy. RADIATION DOSE REDUCTION: This exam was performed according to the departmental dose-optimization program which includes automated exposure control, adjustment of the mA and/or kV according to patient size and/or use of iterative reconstruction technique. CONTRAST:  75mL OMNIPAQUE IOHEXOL 350 MG/ML SOLN COMPARISON:  Same day chest radiograph and CT chest 05/01/2022 FINDINGS: Cardiovascular: Negative for acute pulmonary embolism. No pericardial effusion. Coronary artery and aortic atherosclerotic calcification. Mediastinum/Nodes: Trachea and esophagus are unremarkable. Unchanged prominent right hilar and subcarinal lymph node measuring up to 1.0 cm. Lungs/Pleura: Interlobular septal thickening patchy ground-glass opacities greatest in the lower lungs. Bronchial wall thickening with mucous plugging. Centrilobular micro nodules and abutting tree opacities in the lower lungs. Small right and trace left pleural effusions. No pneumothorax. Upper Abdomen: No acute abnormality. Musculoskeletal: No acute fracture. Review  of the MIP images confirms the above findings. IMPRESSION: 1. Negative for acute pulmonary embolism. 2. Airway inflammation/infection and/or aspiration greatest in the lower lobes. Small bilateral pleural effusions. Aortic Atherosclerosis (ICD10-I70.0). Electronically Signed   By: Minerva Fester M.D.   On: 01/08/2023 22:29   DG Chest Portable 1 View Result Date: 01/08/2023 CLINICAL DATA:  Shortness of breath. EXAM: PORTABLE CHEST 1 VIEW COMPARISON:  Chest radiographs 10/25/2022 and 07/30/2022; CT chest 05/01/2022 FINDINGS: Cardiac silhouette is again mildly enlarged. Trace pericardial fluid was seen on 05/01/2022 chest CT. Chroniclower lung reticular opacities are similar to 10/25/2022. No definite acute airspace opacity. No pleural effusion pneumothorax. No significant skeletal abnormality. IMPRESSION: 1. Chronic lower lung reticular opacities are similar to 10/25/2022 radiograph and 05/01/2022 CT, likely chronic lung disease. No definite acute airspace opacity. 2. Mild cardiomegaly. Electronically Signed   By: Neita Garnet M.D.   On: 01/08/2023 20:51   IR AV DIALY SHUNT INTRO NEEDLE/INTRACATH INITIAL W/PTA/IMG LEFT Result Date: 12/13/2022 INDICATION: 62 year old with end-stage renal disease and left forearm AV fistula. Unable to dialyze on 12/12/2022. Pulling clots and difficult cannulation. EXAM: 1. Left upper extremity fistulogram 2. Balloon angioplasty of outflow vein 3. Ultrasound guidance for vascular access MEDICATIONS: Hydralazine 10 mg ANESTHESIA/SEDATION: Moderate (conscious) sedation was employed during this procedure. A total of Versed 6 mg and Fentanyl 100 mcg was administered intravenously by the radiology nurse. Total intra-service moderate Sedation Time: 33 minutes. The patient's level of consciousness and vital signs were monitored continuously by radiology nursing throughout the procedure under my direct supervision. FLUOROSCOPY: Radiation Exposure Index (as provided by the fluoroscopic  device): 19 mGy Kerma CONTRAST:  55 mL Omnipaque 300 COMPLICATIONS: None immediate. PROCEDURE: Informed written consent was obtained from the patient after a thorough discussion of the procedural risks, benefits and alternatives. All questions were addressed. A timeout was performed prior to the initiation of the procedure. Left forearm was prepped and draped in sterile fashion. Maximal barrier sterile technique was utilized including caps, mask, sterile gowns, sterile gloves, sterile drape, hand hygiene and skin antiseptic. Ultrasound confirmed a patent radiocephalic fistula and ultrasound image was saved for documentation. The distal forearm was anesthetized using 1% lidocaine. Using ultrasound guidance, 21 gauge  needle was directed into the outflow vein in the distal forearm. Micropuncture dilator set was placed. Fistulogram images were obtained. Micropuncture catheter was exchanged for a 6 [place] vascular sheath over a superstiff Amplatz wire. Kumpe catheter and Bentson wire were advanced into the upper arm outflow vein. The area of severe stenosis in the proximal forearm was angioplastied with a 6 mm x 40 mm Conquest balloon. Follow-up fistulogram images were obtained. The stenosis was treated a second time with an 8 mm x 40 mm Conquest balloon. Follow-up fistulogram images were obtained. Vascular sheath was removed using a pursestring suture. Bandage was placed. FINDINGS: Left radiocephalic fistula is patent. 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 Short segment high-grade stenosis (greater than 80%) in this aneurysmal segment. In addition, there is a large competing vein in the mid forearm with retrograde flow into the distal forearm and wrist. Outflow veins in the left upper arm are patent and the dominant outflow vessel appears to be the left basilic vein. Central veins are patent. Arterial anastomosis is patent. Small irregular arteries in the distal radial artery  near the wrist are poorly characterized on this examination but there could be a small aneurysm formation in this area. The area of severe venous stenosis was balloon angioplastied with 6 mm and 8 mm balloons. Markedly improved flow through the area of stenosis following the procedure. Mild residual narrowing in the angioplastied vein compared to the adjacent aneurysmal segments. IMPRESSION: 1. 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 This area was successfully treated with balloon angioplasty. Markedly improved flow following balloon angioplasty. 2. Large competing vein in the mid forearm. 3. Question small aneurysm formation involving the left radial artery distal to the AV fistula and near the wrist. ACCESS: This access remains amenable to future percutaneous interventions as clinically indicated. Electronically Signed   By: Richarda Overlie M.D.   On: 12/13/2022 14:15   IR US Guide Vasc Access Left Result Date: 12/13/2022 INDICATION: 62 year old with end-stage renal disease and left forearm AV fistula. Unable to dialyze on 12/12/2022. Pulling clots and difficult cannulation. EXAM: 1. Left upper extremity fistulogram 2. Balloon angioplasty of outflow vein 3. Ultrasound guidance for vascular access MEDICATIONS: Hydralazine 10 mg ANESTHESIA/SEDATION: Moderate (conscious) sedation was employed during this procedure. A total of Versed 6 mg and Fentanyl 100 mcg was administered intravenously by the radiology nurse. Total intra-service moderate Sedation Time: 33 minutes. The patient's level of consciousness and vital signs were monitored continuously by radiology nursing throughout the procedure under my direct supervision. FLUOROSCOPY: Radiation Exposure Index (as provided by the fluoroscopic device): 19 mGy Kerma CONTRAST:  55 mL Omnipaque 300 COMPLICATIONS: None immediate. PROCEDURE: Informed written consent was obtained from the patient after a thorough discussion of the procedural  risks, benefits and alternatives. All questions were addressed. A timeout was performed prior to the initiation of the procedure. Left forearm was prepped and draped in sterile fashion. Maximal barrier sterile technique was utilized including caps, mask, sterile gowns, sterile gloves, sterile drape, hand hygiene and skin antiseptic. Ultrasound confirmed a patent radiocephalic fistula and ultrasound image was saved for documentation. The distal forearm was anesthetized using 1% lidocaine. Using ultrasound guidance, 21 gauge needle was directed into the outflow vein in the distal forearm. Micropuncture dilator set was placed. Fistulogram images were obtained. Micropuncture catheter was exchanged for a 6 [place] vascular sheath over a superstiff Amplatz wire. Kumpe catheter and Bentson wire were advanced into the upper arm  outflow vein. The area of severe stenosis in the proximal forearm was angioplastied with a 6 mm x 40 mm Conquest balloon. Follow-up fistulogram images were obtained. The stenosis was treated a second time with an 8 mm x 40 mm Conquest balloon. Follow-up fistulogram images were obtained. Vascular sheath was removed using a pursestring suture. Bandage was placed. FINDINGS: Left radiocephalic fistula is patent. 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 Short segment high-grade stenosis (greater than 80%) in this aneurysmal segment. In addition, there is a large competing vein in the mid forearm with retrograde flow into the distal forearm and wrist. Outflow veins in the left upper arm are patent and the dominant outflow vessel appears to be the left basilic vein. Central veins are patent. Arterial anastomosis is patent. Small irregular arteries in the distal radial artery near the wrist are poorly characterized on this examination but there could be a small aneurysm formation in this area. The area of severe venous stenosis was balloon angioplastied with 6 mm and 8  mm balloons. Markedly improved flow through the area of stenosis following the procedure. Mild residual narrowing in the angioplastied vein compared to the adjacent aneurysmal segments. IMPRESSION: 1. 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 This area was successfully treated with balloon angioplasty. Markedly improved flow following balloon angioplasty. 2. Large competing vein in the mid forearm. 3. Question small aneurysm formation involving the left radial artery distal to the AV fistula and near the wrist. ACCESS: This access remains amenable to future percutaneous interventions as clinically indicated. Electronically Signed   By: Richarda Overlie M.D.   On: 12/13/2022 14:15    Microbiology: Recent Results (from the past 240 hours)  SARS Coronavirus 2 by RT PCR (hospital order, performed in [facility] hospital lab) *cepheid single result test* Anterior Nasal Swab     Status: None   Collection Time: 01/09/23 12:08 AM   Specimen: Anterior Nasal Swab  Result Value Ref Range Status   SARS Coronavirus 2 by RT PCR NEGATIVE NEGATIVE Final    Comment: Performed at [facility], [address], [place], [place] [address]  MRSA Next Gen by PCR, Nasal     Status: None   Collection Time: 01/09/23  8:18 PM   Specimen: Nasal Mucosa; Nasal Swab  Result Value Ref Range Status   MRSA by PCR Next Gen NOT DETECTED NOT DETECTED Final    Comment: (NOTE) The GeneXpert MRSA Assay (FDA approved for NASAL specimens only), is one component of a comprehensive MRSA colonization surveillance program. It is not intended to diagnose MRSA infection nor to guide or monitor treatment for MRSA infections. Test performance is not FDA approved in patients less than 17 years old. Performed at [facility], [address] [place], [place] [address]      Labs: Basic Metabolic Panel: Recent Labs  Lab 01/08/23 1918 01/08/23 1923 01/09/23 1048 01/10/23 0212 01/11/23 0211  NA  141 142 139 137 140  K 4.1 4.1 3.3* 4.3 4.2  CL  --  103 98 100 103  CO2  --  24 27 20* 24  GLUCOSE  --  117* 127* 142* 108*  BUN  --  30* 14 29* 39*  CREATININE  --  10.40* 5.97* 8.01* 10.95*  CALCIUM  --  9.0 9.2 8.7* 8.7*  PHOS  --   --  4.7* 7.5*  --    Liver Function Tests: Recent Labs  Lab 01/08/23 1923 01/10/23 0212  AST 13*  --   ALT 13  --   ALKPHOS 63  --   BILITOT 0.7  --   PROT 6.7  --   ALBUMIN 3.6 2.9*   No results for input(s): "LIPASE", "AMYLASE" in the last 168 hours. No results for input(s): "AMMONIA" in the last 168 hours. CBC: Recent Labs  Lab 01/08/23 1923 01/08/23 2127 01/09/23 0921 01/10/23 0804 01/11/23 0211  WBC 1.4* 3.8* 2.9* 3.2* 2.4*  NEUTROABS 1.0* 2.4  --  2.1  --   HGB 17.1* 8.5* 8.4* 8.3* 7.9*  HCT 53.9* 26.3* 26.0* 24.8* 24.7*  MCV 99.6 101.5* 100.4* 98.4 99.6  PLT 38* 98* 191 152 87*   Cardiac Enzymes: No results for input(s): "CKTOTAL", "CKMB", "CKMBINDEX", "TROPONINI" in the last 168 hours. BNP: BNP (last 3 results) Recent Labs    01/08/23 1923  BNP 1,590.5*    ProBNP (last 3 results) No results for input(s): "PROBNP" in the last 8760 hours.  CBG: No results for input(s): "GLUCAP" in the last 168 hours.     Signed:  Zannie Cove MD.  [facility] 01/11/2023, 1:31 PM

## 2023-01-11 NOTE — Progress Notes (Signed)
Back from Hemodialysis by bed awake and alert. 

## 2023-01-11 NOTE — TOC Transition Note (Signed)
Transition of Care [facility]) - Discharge Note   Patient Details  Name: Kirk [name] MRN: [id number] Date of Birth: [birth year]  [facility]) CM/SW Contact:  Leone Haven, RN Phone Number: 01/11/2023, 5:30 PM   Clinical Narrative:    For possible dc today, has no needs. Wife is at bedside.   Final next level of care: Home/Self Care Barriers to Discharge: No Barriers Identified   Patient Goals and CMS Choice Patient states their goals for this hospitalization and ongoing recovery are:: return home   Choice offered to / list presented to : NA      Discharge Placement                       Discharge Plan and Services Additional resources added to the After Visit Summary for   In-house Referral: NA Discharge Planning Services: CM Consult Post Acute Care Choice: NA          DME Arranged: N/A DME Agency: NA       HH Arranged: NA          Social Drivers of Health (SDOH) Interventions SDOH Screenings   Food Insecurity: No Food Insecurity (01/09/2023)  Recent Concern: Food Insecurity - Food Insecurity Present (12/18/2022)  Housing: Low Risk  (01/09/2023)  Transportation Needs: No Transportation Needs (01/09/2023)  Utilities: Not At Risk (01/09/2023)  Alcohol Screen: Low Risk  (02/03/2018)  Depression (PHQ2-9): Low Risk  (10/02/2022)  Financial Resource Strain: Medium Risk (11/02/2021)  Physical Activity: Insufficiently Active (06/25/2017)  Social Connections: Moderately Integrated (06/25/2017)  Stress: No Stress Concern Present (06/25/2017)  Tobacco Use: Medium Risk (01/10/2023)     Readmission Risk Interventions    01/11/2023    5:26 PM  Readmission Risk Prevention Plan  Transportation Screening Complete  Medication Review (RN Care Manager) Complete  PCP or Specialist appointment within 3-5 days of discharge Complete  HRI or Home Care Consult Complete  Palliative Care Screening Not Applicable  Skilled Nursing Facility Not Applicable

## 2023-01-11 NOTE — Discharge Planning (Signed)
[facility] Patient Discharge Orders - [facility] CLINIC: [place]  Patient's name: Kirk [name] Admit/DC Dates: 01/08/2023 - 01/11/23  DISCHARGE DIAGNOSES: Acute Hypoxic Resp Failure   Pneumonia  HD ORDER CHANGES: Heparin change: no EDW Change: YES New EDW: 89.5kg Bath Change: no  ANEMIA MANAGEMENT: Aranesp: Given: no    ESA dose for discharge: mircera 200 mcg IV q 2 weeks, to start on 12/27 (usual schedule) IV Iron dose at discharge: per protocol Transfusion: Given: no  BONE/MINERAL MEDICATIONS: Hectorol/Calcitriol change: no Sensipar/Parsabiv change: no  ACCESS INTERVENTION/CHANGE: no Details:  RECENT LABS: Recent Labs  Lab 01/10/23 0212 01/10/23 0804 01/11/23 0211  HGB  --    < > 7.9*  NA 137  --  140  K 4.3  --  4.2  CALCIUM 8.7*  --  8.7*  PHOS 7.5*  --   --   ALBUMIN 2.9*  --   --    < > = values in this interval not displayed.   IV ANTIBIOTICS: no Details: Will be finishing course of PO Cefdinir  OTHER ANTICOAGULATION: On Coumadin?: no  OTHER/APPTS/LAB ORDERS:  - S/p echo this admit - HFpEF (EF 50-55%, G2DD)  D/C Meds to be reconciled by nurse after every discharge.  Completed By: Ozzie Hoyle, PA-C [facility] Pager [phone]   Reviewed by: MD:______ RN_______

## 2023-01-11 NOTE — TOC Initial Note (Signed)
Transition of Care [facility]) - Initial/Assessment Note    Patient Details  Name: Kirk [name] MRN: [id number] Date of Birth: Dec 30, 1960  Transition of Care [facility] CM/SW Contact:    Leone Haven, RN Phone Number: 01/11/2023, 5:30 PM  Clinical Narrative:                 From home with spouse, has PCP and insurance on file, states has no HH services in place at this time or DME at home.  States family member will transport them home at [employer] and family is support system, [facility] [address]  Pta self ambulatory .  Expected Discharge Plan: Home/Self Care Barriers to Discharge: No Barriers Identified   Patient Goals and CMS Choice Patient states their goals for this hospitalization and ongoing recovery are:: return home   Choice offered to / list presented to : NA      Expected Discharge Plan and Services In-house Referral: NA Discharge Planning Services: CM Consult Post Acute Care Choice: NA Living arrangements for the past 2 months: Single Family Home                 DME Arranged: N/A DME Agency: NA       HH Arranged: NA          Prior Living Arrangements/Services Living arrangements for the past 2 months: Single Family Home Lives with:: Spouse Patient language and need for interpreter reviewed:: Yes Do you feel safe going back to the place where you live?: Yes      Need for Family Participation in Patient Care: Yes (Comment) Care giver support system in place?: Yes (comment)   Criminal Activity/Legal Involvement Pertinent to Current Situation/Hospitalization: No - Comment as needed  Activities of Daily Living   ADL Screening (condition at time of admission) Independently performs ADLs?: Yes (appropriate for developmental age) Is the patient deaf or have difficulty hearing?: No Does the patient have difficulty seeing, even when wearing glasses/contacts?: No Does the patient have difficulty concentrating, remembering, or  making decisions?: No  Permission Sought/Granted Permission sought to share information with : Case Manager Permission granted to share information with : Yes, Verbal Permission Granted              Emotional Assessment       Orientation: : Oriented to Self, Oriented to Place, Oriented to  Time, Oriented to Situation Alcohol / Substance Use: Not Applicable Psych Involvement: No (comment)  Admission diagnosis:  Bilateral pleural effusion [J90] Pneumonia of both lower lobes due to infectious organism [J18.9] Acute hypoxic respiratory failure (HCC) [J96.01] Patient Active Problem List   Diagnosis Date Noted   Acute hypoxic respiratory failure (HCC) 01/09/2023   Acute on chronic diastolic CHF (congestive heart failure) (HCC) 01/09/2023   Chronic bronchitis with acute exacerbation (HCC) 09/18/2022   Rhinitis 09/18/2022   PTSD (post-traumatic stress disorder) 09/12/2022   Aortic atherosclerosis (HCC) 05/25/2022   Hypertensive emergency 04/29/2022   Cerebral aneurysm 11/30/2021   Subdural hematoma (HCC) 11/30/2021   Traumatic subdural hematoma (SDH) (HCC) 11/21/2021   SDH (subdural hematoma) (HCC) 11/08/2021   Seizure (HCC) 11/08/2021   Chronic left shoulder pain 09/21/2021   Chronic right shoulder pain 09/21/2021   Cough with hemoptysis 08/01/2021   Preop respiratory exam 03/02/2021   Intractable nausea and vomiting 01/09/2021   Abnormal CT of the chest 05/24/2020   Rotator cuff syndrome of right shoulder 11/17/2019   Chronic systolic heart failure (HCC) 09/25/2018  Fluid overload 09/17/2018   Hypertensive urgency 09/17/2018   Right knee pain 06/02/2018   Right tibial fracture 06/02/2018   MDD (major depressive disorder), recurrent episode (HCC) 05/30/2017   Community acquired bilateral lower lobe pneumonia 05/30/2017   Hypokalemia    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   Shunt malfunction 07/03/2016   Need for hepatitis C screening test 06/29/2015   Myalgia 05/17/2015    Secondary central sleep apnea 12/09/2014   Obesity hypoventilation syndrome (HCC) 12/09/2014   Narcotic drug use 12/09/2014   Hypersomnia with sleep apnea 12/09/2014   SCC (squamous cell carcinoma), arm 11/09/2014   Mild persistent chronic asthma without complication 09/18/2014   ESRD on dialysis (HCC) 09/09/2014   Essential hypertension 09/09/2014   HLD (hyperlipidemia) 09/09/2014   Anemia of chronic disease 06/23/2014   Pancytopenia (HCC) 04/28/2014   Thrombocytopenia (HCC) 04/10/2014   Acute respiratory failure with hypoxia (HCC)    Fungal dermatitis 03/17/2014   S/p nephrectomy 09/17/2013   Anemia 09/03/2012   Uncontrolled hypertension    Tension headache    Memory loss    Edema    Thoracic or lumbosacral neuritis or radiculitis 03/27/2012   Nerve root pain 03/27/2012   History of kidney transplant 09/10/2011   Type 2 diabetes mellitus with diabetic nephropathy, without long-term current use of insulin (HCC) 08/30/2011   Hearing loss 08/16/2011   Obesity 08/16/2011   OSA on CPAP 08/05/2011   GIB (gastrointestinal bleeding) 10/28/2007   PCP:  Caesar Bookman, NP [facility]   [phone] [place] [place] - [address] AT CORNER OF [facility] [address] [place] [address] Phone: [phone] Fax: [phone]  [facility] - [place], [place] - [address] [place] [address] Phone: [phone] Fax: [phone]  [facility] [place], [place] - [address] [facility] AT Portal to Registered [address] [place] [address] Phone: [phone] Fax: [phone]  [facility] [address] [place] [address] Phone: [phone] Fax: [phone]     Social Drivers of Health (SDOH) Social History: SDOH Screenings   Food Insecurity: No Food Insecurity (01/09/2023)  Recent Concern: Food  Insecurity - Food Insecurity Present (12/18/2022)  Housing: Low Risk  (01/09/2023)  Transportation Needs: No Transportation Needs (01/09/2023)  Utilities: Not At Risk (01/09/2023)  Alcohol Screen: Low Risk  (02/03/2018)  Depression (PHQ2-9): Low Risk  (10/02/2022)  Financial Resource Strain: Medium Risk (11/02/2021)  Physical Activity: Insufficiently Active (06/25/2017)  Social Connections: Moderately Integrated (06/25/2017)  Stress: No Stress Concern Present (06/25/2017)  Tobacco Use: Medium Risk (01/10/2023)   SDOH Interventions:     Readmission Risk Interventions    01/11/2023    5:26 PM  Readmission Risk Prevention Plan  Transportation Screening Complete  Medication Review (RN Care Manager) Complete  PCP or Specialist appointment within 3-5 days of discharge Complete  HRI or Home Care Consult Complete  Palliative Care Screening Not Applicable  Skilled Nursing Facility Not Applicable

## 2023-01-11 NOTE — Progress Notes (Signed)
Complained of headache and nauseous ness. Due meds given. MD made aware.

## 2023-01-11 NOTE — Progress Notes (Signed)
Patient is in hemodialysis during shift change.

## 2023-01-11 NOTE — Progress Notes (Signed)
Received patient  from night HD nurse on his second hour of his treatment.Alert and oriented x 4.  Access used ;Left lower arm AVF that worked well.  Duration of treatment: 4 hours.  Net uf: 3.3 liters.  Tolerated treatment : Yes.  Hand off to the patient's nurse:Back into his room with stable medical condition via transporter.

## 2023-01-12 ENCOUNTER — Other Ambulatory Visit (HOSPITAL_COMMUNITY): Payer: Self-pay

## 2023-01-12 NOTE — Plan of Care (Signed)

## 2023-01-13 ENCOUNTER — Telehealth: Payer: Self-pay | Admitting: Nephrology

## 2023-01-13 DIAGNOSIS — N2581 Secondary hyperparathyroidism of renal origin: Secondary | ICD-10-CM | POA: Diagnosis not present

## 2023-01-13 DIAGNOSIS — D631 Anemia in chronic kidney disease: Secondary | ICD-10-CM | POA: Diagnosis not present

## 2023-01-13 DIAGNOSIS — D509 Iron deficiency anemia, unspecified: Secondary | ICD-10-CM | POA: Diagnosis not present

## 2023-01-13 DIAGNOSIS — N186 End stage renal disease: Secondary | ICD-10-CM | POA: Diagnosis not present

## 2023-01-13 DIAGNOSIS — Z992 Dependence on renal dialysis: Secondary | ICD-10-CM | POA: Diagnosis not present

## 2023-01-13 NOTE — Telephone Encounter (Signed)
Transition of Care Contact from Inpatient Facility  Date of Discharge: 01/12/23 Date of Contact: 01/13/23 Method of contact: phone - attempted  Attempted to contact patient to discuss transition of care from inpatient admission.  Patient did not answer the phone.  Will attempt to call them again and if unable to reach will follow up at dialysis.  Virgina Norfolk, PA-C [employer]

## 2023-01-14 ENCOUNTER — Telehealth: Payer: Self-pay

## 2023-01-14 NOTE — Progress Notes (Signed)
Late Note Entry- Jan 14, 2023  Pt was d/c on Saturday. Contacted [facility] this morning to be advised of pt's d/c date and that pt should have resumed care yesterday (holiday schedule).   Olivia Canter Renal Navigator [phone]

## 2023-01-14 NOTE — Transitions of Care (Post Inpatient/ED Visit) (Signed)
   01/14/2023  Name: Kirk [name] MRN: [id number] DOB: Jan 30, 1960  Today's TOC FU Call Status: Today's TOC FU Call Status:: Unsuccessful Call (1st Attempt) Unsuccessful Call (1st Attempt) Date: 01/14/23  Attempted to reach the patient regarding the most recent Inpatient/ED visit.  Follow Up Plan: Additional outreach attempts will be made to reach the patient to complete the Transitions of Care (Post Inpatient/ED visit) call.     Antionette Fairy, RN,BSN,CCM RN Care Manager Transitions of Care  [place]  Direct Phone: [phone] Toll Free: [phone] Fax: [phone]

## 2023-01-14 NOTE — Transitions of Care (Post Inpatient/ED Visit) (Signed)
   01/14/2023  Name: Kirk [name] MRN: [id number] DOB: [birth year]  Today's TOC FU Call Status: Today's TOC FU Call Status:: Unsuccessful Call (2nd Attempt) Unsuccessful Call (2nd Attempt) Date: 01/14/23  Attempted to reach the patient regarding the most recent Inpatient/ED visit.  Follow Up Plan: Additional outreach attempts will be made to reach the patient to complete the Transitions of Care (Post Inpatient/ED visit) call.      Antionette Fairy, RN,BSN,CCM RN Care Manager Transitions of Care  [place]  Direct Phone: [phone] Toll Free: [phone] Fax: [phone]

## 2023-01-15 ENCOUNTER — Telehealth: Payer: Self-pay

## 2023-01-15 DIAGNOSIS — D631 Anemia in chronic kidney disease: Secondary | ICD-10-CM | POA: Diagnosis not present

## 2023-01-15 DIAGNOSIS — D509 Iron deficiency anemia, unspecified: Secondary | ICD-10-CM | POA: Diagnosis not present

## 2023-01-15 DIAGNOSIS — N186 End stage renal disease: Secondary | ICD-10-CM | POA: Diagnosis not present

## 2023-01-15 DIAGNOSIS — Z992 Dependence on renal dialysis: Secondary | ICD-10-CM | POA: Diagnosis not present

## 2023-01-15 DIAGNOSIS — N2581 Secondary hyperparathyroidism of renal origin: Secondary | ICD-10-CM | POA: Diagnosis not present

## 2023-01-15 NOTE — Transitions of Care (Post Inpatient/ED Visit) (Signed)
   01/15/2023  Name: DAYMON BUCHHOLTZ MRN: [id number] DOB: [birth year]  Today's TOC FU Call Status: Today's TOC FU Call Status:: Unsuccessful Call (3rd Attempt) Unsuccessful Call (3rd Attempt) Date: 01/15/23  Attempted to reach the patient regarding the most recent Inpatient/ED visit.  Follow Up Plan: No further outreach attempts will be made at this time. We have been unable to contact the patient.    Antionette Fairy, RN,BSN,CCM RN Care Manager Transitions of Care  [place]  Direct Phone: [phone] Toll Free: [phone] Fax: [phone]

## 2023-01-18 DIAGNOSIS — N186 End stage renal disease: Secondary | ICD-10-CM | POA: Diagnosis not present

## 2023-01-18 DIAGNOSIS — N2581 Secondary hyperparathyroidism of renal origin: Secondary | ICD-10-CM | POA: Diagnosis not present

## 2023-01-18 DIAGNOSIS — Z992 Dependence on renal dialysis: Secondary | ICD-10-CM | POA: Diagnosis not present

## 2023-01-18 DIAGNOSIS — D631 Anemia in chronic kidney disease: Secondary | ICD-10-CM | POA: Diagnosis not present

## 2023-01-18 DIAGNOSIS — D509 Iron deficiency anemia, unspecified: Secondary | ICD-10-CM | POA: Diagnosis not present

## 2023-01-20 DIAGNOSIS — N186 End stage renal disease: Secondary | ICD-10-CM | POA: Diagnosis not present

## 2023-01-20 DIAGNOSIS — Z992 Dependence on renal dialysis: Secondary | ICD-10-CM | POA: Diagnosis not present

## 2023-01-20 DIAGNOSIS — D509 Iron deficiency anemia, unspecified: Secondary | ICD-10-CM | POA: Diagnosis not present

## 2023-01-20 DIAGNOSIS — D631 Anemia in chronic kidney disease: Secondary | ICD-10-CM | POA: Diagnosis not present

## 2023-01-20 DIAGNOSIS — N2581 Secondary hyperparathyroidism of renal origin: Secondary | ICD-10-CM | POA: Diagnosis not present

## 2023-01-22 DIAGNOSIS — D631 Anemia in chronic kidney disease: Secondary | ICD-10-CM | POA: Diagnosis not present

## 2023-01-22 DIAGNOSIS — N2581 Secondary hyperparathyroidism of renal origin: Secondary | ICD-10-CM | POA: Diagnosis not present

## 2023-01-22 DIAGNOSIS — Z992 Dependence on renal dialysis: Secondary | ICD-10-CM | POA: Diagnosis not present

## 2023-01-22 DIAGNOSIS — D509 Iron deficiency anemia, unspecified: Secondary | ICD-10-CM | POA: Diagnosis not present

## 2023-01-22 DIAGNOSIS — N186 End stage renal disease: Secondary | ICD-10-CM | POA: Diagnosis not present

## 2023-01-23 DIAGNOSIS — N186 End stage renal disease: Secondary | ICD-10-CM | POA: Diagnosis not present

## 2023-01-23 DIAGNOSIS — T8612 Kidney transplant failure: Secondary | ICD-10-CM | POA: Diagnosis not present

## 2023-01-23 DIAGNOSIS — Z992 Dependence on renal dialysis: Secondary | ICD-10-CM | POA: Diagnosis not present

## 2023-01-24 ENCOUNTER — Ambulatory Visit: Payer: Self-pay | Admitting: Licensed Clinical Social Worker

## 2023-01-24 NOTE — Patient Instructions (Signed)
 Visit Information  Thank you for taking time to visit with me today. Please don't hesitate to contact me if I can be of assistance to you.   Following are the goals we discussed today:   Goals Addressed             This Visit's Progress    Care Coordination Activities   On track    Care Coordination Interventions: Patient stated that he and his wife just go to one food pantry, Sw also spoke with the patients wife ( [name]) and the SW will mail out resources for other food pantries. SW also educated the family on the [employer] App.  SW completed the SDOH questionnaire and no other concerns.  Sw will follow up 02/08/2023 at 11:15 am        Our next appointment is by telephone on 02/08/2023 at 11:15 am  Please call the care guide team at [phone] if you need to cancel or reschedule your appointment.   If you are experiencing a Mental Health or Behavioral Health Crisis or need someone to talk to, please call the Suicide and Crisis Lifeline: 988 go to [facility] [address], [place] [phone]) call 911  Patient verbalizes understanding of instructions and care plan provided today and agrees to view in MyChart. Active MyChart status and patient understanding of how to access instructions and care plan via MyChart confirmed with patient.     Tobias CHARM Maranda HEDWIG, PhD [facility], [facility] Social Worker Direct Dial : [phone]  Fax: [phone]

## 2023-01-24 NOTE — Patient Outreach (Signed)
  Care Coordination   Follow Up Visit Note   01/24/2023 Name: Kirk [name] MRN: [id number] DOB: 03-14-1960  Kirk [name] is a 63 y.o. year old male who sees Ngetich, Dinah C, NP for primary care. I spoke with  Kirk [name] by phone today.  What matters to the patients health and wellness today?  Food resources mailed    Goals Addressed             This Visit's Progress    Care Coordination Activities   On track    Care Coordination Interventions: Patient stated that he and his wife just go to one food pantry, Sw also spoke with the patients wife ( Kirk [name]) and the SW will mail out resources for other food pantries. SW also educated the family on the [employer] App.  SW completed the SDOH questionnaire and no other concerns.  Sw will follow up 02/08/2023 at 11:15 am        SDOH assessments and interventions completed:  Yes     Care Coordination Interventions:  Yes, provided  Interventions Today    Flowsheet Row Most Recent Value  General Interventions   General Interventions Discussed/Reviewed General Interventions Reviewed, [employer] is awaiting mailed food reousces, SW will follow up and request for it to be remailed]        Follow up plan: Follow up call scheduled for 02/08/2023 at 11:15 am    Encounter Outcome:  Patient Visit Completed   Tobias CHARM Maranda HEDWIG, PhD [facility], [facility] Social Worker Direct Dial : [phone]  Fax: [phone]

## 2023-01-25 DIAGNOSIS — N186 End stage renal disease: Secondary | ICD-10-CM | POA: Diagnosis not present

## 2023-01-25 DIAGNOSIS — D509 Iron deficiency anemia, unspecified: Secondary | ICD-10-CM | POA: Diagnosis not present

## 2023-01-25 DIAGNOSIS — Z992 Dependence on renal dialysis: Secondary | ICD-10-CM | POA: Diagnosis not present

## 2023-01-25 DIAGNOSIS — N2581 Secondary hyperparathyroidism of renal origin: Secondary | ICD-10-CM | POA: Diagnosis not present

## 2023-01-28 DIAGNOSIS — N2581 Secondary hyperparathyroidism of renal origin: Secondary | ICD-10-CM | POA: Diagnosis not present

## 2023-01-28 DIAGNOSIS — D509 Iron deficiency anemia, unspecified: Secondary | ICD-10-CM | POA: Diagnosis not present

## 2023-01-28 DIAGNOSIS — N186 End stage renal disease: Secondary | ICD-10-CM | POA: Diagnosis not present

## 2023-01-28 DIAGNOSIS — Z992 Dependence on renal dialysis: Secondary | ICD-10-CM | POA: Diagnosis not present

## 2023-01-29 ENCOUNTER — Encounter: Payer: Self-pay | Admitting: Family

## 2023-01-29 ENCOUNTER — Ambulatory Visit (INDEPENDENT_AMBULATORY_CARE_PROVIDER_SITE_OTHER): Payer: Medicare Other | Admitting: Family

## 2023-01-29 VITALS — BP 127/88 | HR 60 | Temp 97.1°F | Resp 18 | Ht 67.0 in | Wt 203.6 lb

## 2023-01-29 DIAGNOSIS — I120 Hypertensive chronic kidney disease with stage 5 chronic kidney disease or end stage renal disease: Secondary | ICD-10-CM

## 2023-01-29 DIAGNOSIS — R972 Elevated prostate specific antigen [PSA]: Secondary | ICD-10-CM

## 2023-01-29 DIAGNOSIS — N186 End stage renal disease: Secondary | ICD-10-CM

## 2023-01-29 DIAGNOSIS — J189 Pneumonia, unspecified organism: Secondary | ICD-10-CM | POA: Diagnosis not present

## 2023-01-29 DIAGNOSIS — M545 Low back pain, unspecified: Secondary | ICD-10-CM

## 2023-01-29 DIAGNOSIS — E1121 Type 2 diabetes mellitus with diabetic nephropathy: Secondary | ICD-10-CM | POA: Diagnosis not present

## 2023-01-29 DIAGNOSIS — G8929 Other chronic pain: Secondary | ICD-10-CM

## 2023-01-29 MED ORDER — RENA-VITE PO TABS: 1.0000 | ORAL_TABLET | Freq: Every day | ORAL | 1 refills | Status: AC

## 2023-01-29 MED ORDER — TRAMADOL HCL 50 MG PO TABS: 50.0000 mg | ORAL_TABLET | Freq: Four times a day (QID) | ORAL | 3 refills | Status: DC | PRN

## 2023-01-29 NOTE — Progress Notes (Signed)
 Provider: Roxan Plough FNP-C  Avabella Wailes, Roxan [name], NP  Patient Care Team: Molly Maselli, Roxan [name], NP as PCP - General (Family Medicine) Mona Vinie [name], MD as PCP - Cardiology (Cardiology) Curvin Deward MOULD, MD as Consulting Physician (General Surgery) Shellia Oh, MD (Inactive) as Consulting Physician (Pulmonary Disease) Perri Starleen [name], MD as Consulting Physician (Nephrology) Harvey Carlin BRAVO, MD (Inactive) as Consulting Physician (Vascular Surgery) Mona Vinie [name], MD as Consulting Physician (Cardiology) Teressa Toribio SQUIBB, MD (Inactive) as Attending Physician (Gastroenterology) Betsey Channel, MD as Consulting Physician (Nephrology) Morgan Clayborne CROME, RN as [facility] Care Management  Extended Emergency Contact Information Primary Emergency Contact: Trudy Hattie Grout Address: [address]          [place], [place] [address] [place] Home Phone: [phone] Mobile Phone: [phone] Relation: Spouse  Code Status:  Full Code  Goals of care: Advanced Directive information    01/10/2023   11:04 PM  Advanced Directives  Would patient like information on creating a medical advance directive? No - Patient declined     Chief Complaint  Patient presents with   Hospitalization Follow-up    follow up from hospital     HPI:  Pt is a 63 y.o. male seen today for an acute visit for post hospitalization from 01/08/2023 -01/11/2023 for bilateral lower lobe community acquired pneumonia after he presented to the ED with a 3-week history of worsening shortness of breath and being compliant on his hemodialysis.  Had clear sputum.  He presented to ED in acute hypoxic respiratory failure was placed in BiPAP.  Also had hypertensive emergency, underwent hemodialysis with much improvement.Bnp was elevated 1,590.5 He was placed on antibiotics for bilateral lower lobe community-acquired pneumonia. SARS, MRSA was negative. Symptoms improved oxygen  weaned off.  He was  transitioned to 5 days close of oral cefdinir .Condition improved and was discharged home. Possible respiratory pneumonia suspected wife states swallow study was to be ordered by specialist in the waiting appointment. Checks x-ray recommended in 6 weeks. States symptoms have improved but gets short of breath every now and then when he lies down.  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)    Anxiety    Asthma    Asthma    moderate persistent   Carpal tunnel syndrome    Cellulitis and abscess of trunk    Cholelithiasis 07/13/2014   Chronic headaches    Cigarette smoker 07/11/2017   Stopped 2/022  - referred to start Lung cancer screening 04/2021         Debility, unspecified    Dermatophytosis of the body    Dysrhythmia    history of   Edema    End stage renal disease on dialysis (HCC)    MWF; [facility] in [place] (10/21/2014)   Essential hypertension, benign    Generalized anxiety disorder 05/25/2022   GERD (gastroesophageal reflux disease)    Gout, unspecified    HTN (hypertension)    Hypertrophy of prostate without urinary obstruction and other lower urinary tract symptoms (LUTS)    Hypotension, unspecified    Impotence of organic origin    Insomnia, unspecified    Kidney replaced by transplant    Localization-related (focal) (partial) epilepsy and epileptic syndromes with complex partial seizures, without mention of intractable epilepsy    12-15-19- Wife states he  has NEVER had a seizure    Lumbago    Memory loss    OSA on CPAP    Other and unspecified hyperlipidemia    controlled /managed per wife    Other chronic nonalcoholic liver disease    Other malaise and fatigue    Other nonspecific abnormal serum enzyme levels    Pain in joint, lower leg    Pain in joint, upper arm    Pneumonia several times   PONV  (postoperative nausea and vomiting)    Renal dialysis status(V45.11) 02/05/2010   restarted 01/02/13 ofter renal trransplant failure   Secondary hyperparathyroidism (of renal origin)    Shortness of breath    Sleep apnea    wears cpap    Tension headache    Unspecified constipation    Unspecified essential hypertension    Unspecified hereditary and idiopathic peripheral neuropathy    Unspecified vitamin D  deficiency    Past Surgical History:  Procedure Laterality Date   AV FISTULA PLACEMENT Left ?2010   forearm; at [facility]   BACK SURGERY     CARDIAC CATHETERIZATION  03/21/2011   CHOLECYSTECTOMY N/A 10/21/2014   Procedure: LAPAROSCOPIC CHOLECYSTECTOMY WITH INTRAOPERATIVE CHOLANGIOGRAM;  Surgeon: Deward Null III, MD;  Location: [facility] OR;  Service: General;  Laterality: N/A;   COLONOSCOPY     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   IR ANGIO INTRA EXTRACRAN SEL INTERNAL CAROTID UNI R MOD SED  11/30/2021   IR ANGIOGRAM FOLLOW UP STUDY  11/30/2021   IR AV DIALY SHUNT INTRO NEEDLE/INTRACATH INITIAL W/PTA/IMG LEFT  12/13/2022   IR BONE MARROW BIOPSY & ASPIRATION  03/27/2022   IR NEURO EACH ADD'L AFTER BASIC UNI RIGHT (MS)  11/30/2021   IR TRANSCATH/EMBOLIZ  11/30/2021   IR US  GUIDE VASC ACCESS LEFT  12/13/2022   KIDNEY TRANSPLANT  08/17/2011   [facility]    LAPAROSCOPIC CHOLECYSTECTOMY  10/21/2014   w/IOC   LEFT HEART CATHETERIZATION WITH CORONARY ANGIOGRAM N/A 03/21/2011   Procedure: LEFT HEART CATHETERIZATION WITH CORONARY ANGIOGRAM;  Surgeon: Vinie KYM Maxcy, MD;  Location: [facility] C[facility] LAB;  Service: Cardiovascular;  Laterality: N/A;   NEPHRECTOMY  08/2013   removed transplaned kidney   POLYPECTOMY     POSTERIOR FUSION CERVICAL SPINE  06/25/2012   for spinal stenosis   RADIOLOGY WITH ANESTHESIA N/A 11/30/2021   Procedure: RIGHT MIDDLE MENINGEAL ARTERY EMBOLIZATION;  Surgeon: Radiologist, Medication, MD;  Location: [facility] OR;  Service: Radiology;  Laterality: N/A;    VASECTOMY  2010    Allergies  Allergen Reactions   Codeine Nausea And Vomiting   Hydrocodone -Acetaminophen  Nausea And Vomiting    Outpatient Encounter Medications as of 01/29/2023  Medication Sig   acetaminophen  (TYLENOL ) 500 MG tablet Take 1 tablet (500 mg total) by mouth every 6 (six) hours as needed for moderate pain.   albuterol  (PROVENTIL ) (2.5 MG/3ML) 0.083% nebulizer solution Take 3 mLs (2.5 mg total) by nebulization every 6 (six) hours as needed for wheezing or shortness of breath.   albuterol  (VENTOLIN  HFA) 108 (90 Base) MCG/ACT inhaler Inhale 2 puffs into the lungs every 6 (six) hours as needed for wheezing or shortness of breath.   amLODipine  (NORVASC ) 10 MG tablet TAKE 1 TABLET BY MOUTH EVERY DAY   aspirin  81 MG chewable tablet Chew 81 mg by mouth daily.   calcitRIOL  (ROCALTROL ) 0.5 MCG capsule Take 2 capsules (1 mcg total) by mouth every Monday, Wednesday, and Friday with hemodialysis.   cefdinir  (OMNICEF )  300 MG capsule Take 1 tablet (300 mg) by mouth on Sunday, Tuesday and Friday   Cholecalciferol  (VITAMIN D3) 25 MCG (1000 UT) CAPS Take 4,000 Units by mouth daily. Take at home on Tues, Thurs, Sat, and Sun   cinacalcet  (SENSIPAR ) 30 MG tablet Take 2 tablets (60 mg total) by mouth 3 (three) times a week.   diphenhydrAMINE  (BENADRYL ) 25 mg capsule Take 25 mg by mouth every 6 (six) hours as needed for allergies.   doxazosin  (CARDURA ) 8 MG tablet Take 1 tablet (8 mg total) by mouth daily. (Patient taking differently: Take 8 mg by mouth at bedtime.)   fluticasone -salmeterol (ADVAIR HFA) 115-21 MCG/ACT inhaler Inhale 2 puffs into the lungs 2 (two) times daily.   FOSRENOL  1000 MG PACK Take 1 packet by mouth with breakfast, with lunch, and with evening meal. Also take when eating snacks   guaiFENesin -dextromethorphan  (ROBITUSSIN DM) 100-10 MG/5ML syrup Take 5 mLs by mouth every 4 (four) hours as needed for cough.   hydrALAZINE  (APRESOLINE ) 100 MG tablet Take 1 tablet (100 mg total) by  mouth 3 (three) times daily.   isosorbide  mononitrate (IMDUR ) 120 MG 24 hr tablet Take 1 tablet (120 mg total) by mouth daily. (Patient taking differently: Take 120 mg by mouth at bedtime.)   metoprolol  tartrate (LOPRESSOR ) 100 MG tablet TAKE 1 TABLET BY MOUTH TWICE A DAY   ondansetron  (ZOFRAN -ODT) 4 MG disintegrating tablet TAKE 1 TABLET BY MOUTH EVERY 8 HOURS AS NEEDED FOR NAUSEA AND VOMITING   pantoprazole  (PROTONIX ) 40 MG tablet Take 1 tablet (40 mg total) by mouth 2 (two) times daily before a meal.   pravastatin  (PRAVACHOL ) 40 MG tablet TAKE 1 TABLET BY MOUTH EVERY DAY IN THE EVENING   sevelamer  carbonate (RENVELA ) 800 MG tablet Take 2 tablets (1,600 mg total) by mouth as needed (snacks). (Patient taking differently: Take 1,600 mg by mouth 4 (four) times daily. Take with a meal and snacks)   traZODone  (DESYREL ) 50 MG tablet Take 0.5-1 tablets (25-50 mg total) by mouth at bedtime as needed. for sleep (Patient taking differently: Take 50 mg by mouth at bedtime as needed for sleep. for sleep)   [DISCONTINUED] multivitamin (RENA-VIT) TABS tablet Take 1 tablet by mouth daily.   multivitamin (RENA-VIT) TABS tablet Take 1 tablet by mouth daily.   Facility-Administered Encounter Medications as of 01/29/2023  Medication   sodium chloride  flush (NS) 0.9 % injection 10 mL    Review of Systems  Constitutional:  Negative for appetite change, chills, fatigue, fever and unexpected weight change.  HENT:  Negative for congestion, dental problem, ear discharge, ear pain, facial swelling, hearing loss, nosebleeds, postnasal drip, rhinorrhea, sinus pressure, sinus pain, sneezing, sore throat, tinnitus and trouble swallowing.   Eyes:  Negative for pain, discharge, redness, itching and visual disturbance.  Respiratory:  Negative for cough, chest tightness, shortness of breath and wheezing.   Cardiovascular:  Negative for chest pain, palpitations and leg swelling.  Gastrointestinal:  Negative for abdominal  distention, abdominal pain, blood in stool, constipation, diarrhea, nausea and vomiting.  Endocrine: Negative for cold intolerance, heat intolerance, polydipsia, polyphagia and polyuria.  Genitourinary:  Negative for frequency.       On hemodialysis   Musculoskeletal:  Positive for back pain and gait problem. Negative for arthralgias, joint swelling, myalgias, neck pain and neck stiffness.  Skin:  Negative for color change, pallor, rash and wound.  Neurological:  Negative for dizziness, syncope, speech difficulty, weakness, light-headedness, numbness and headaches.  Hematological:  Does not  bruise/bleed easily.  Psychiatric/Behavioral:  Negative for agitation, behavioral problems, confusion, hallucinations, self-injury, sleep disturbance and suicidal ideas. The patient is not nervous/anxious.     Immunization History  Administered Date(s) Administered   Influenza Whole 11/01/2010   Influenza, Seasonal, Injecte, Preservative Fre 10/29/2012   Influenza-Unspecified 11/21/2011, 11/05/2012, 09/22/2013, 09/22/2017, 11/23/2019, 10/28/2020, 12/04/2020   Moderna Sars-Covid-2 Vaccination 04/06/2019, 05/07/2019, 03/31/2020   PPD Test 12/26/2012   Pneumococcal Polysaccharide-23 11/01/2010, 10/29/2012   Pneumococcal-Unspecified 11/05/2012   Tdap 01/22/2006, 06/08/2021   Pertinent  Health Maintenance Due  Topic Date Due   HEMOGLOBIN A1C  11/21/2022   Colonoscopy  12/29/2026   INFLUENZA VACCINE  Completed   FOOT EXAM  Discontinued   OPHTHALMOLOGY EXAM  Discontinued      12/07/2021   11:07 AM 02/28/2022    3:52 PM 09/18/2022    9:07 AM 10/02/2022   11:25 AM 01/29/2023   10:38 AM  Fall Risk  Falls in the past year? 0 1 1 0 1  Was there an injury with Fall? 0 1 0  0  Fall Risk Category Calculator 0 3 2  2   Fall Risk Category (Retired) Low      (RETIRED) Patient Fall Risk Level Low fall risk      Patient at Risk for Falls Due to No Fall Risks No Fall Risks History of fall(s)  History of fall(s)   Fall risk Follow up Falls evaluation completed Falls evaluation completed Falls evaluation completed  Falls evaluation completed   Functional Status Survey:    Vitals:   01/29/23 1034  BP: 127/88  Pulse: 60  Resp: 18  Temp: (!) 97.1 F (36.2 C)  SpO2: 96%  Weight: 203 lb 9.6 oz (92.4 kg)  Height: 5' 7 (1.702 m)   Body mass index is 31.89 kg/m. Physical Exam Vitals reviewed.  Constitutional:      General: He is not in acute distress.    Appearance: Normal appearance. He is normal weight. He is not ill-appearing or diaphoretic.  HENT:     Head: Normocephalic.     Right Ear: Tympanic membrane, ear canal and external ear normal. There is no impacted cerumen.     Left Ear: Tympanic membrane, ear canal and external ear normal. There is no impacted cerumen.     Nose: Nose normal. No congestion or rhinorrhea.     Mouth/Throat:     Mouth: Mucous membranes are moist.     Pharynx: Oropharynx is clear. No oropharyngeal exudate or posterior oropharyngeal erythema.  Eyes:     General: No scleral icterus.       Right eye: No discharge.        Left eye: No discharge.     Extraocular Movements: Extraocular movements intact.     Conjunctiva/sclera: Conjunctivae normal.     Pupils: Pupils are equal, round, and reactive to light.  Neck:     Vascular: No carotid bruit.  Cardiovascular:     Rate and Rhythm: Normal rate and regular rhythm.     Pulses: Normal pulses.     Heart sounds: Normal heart sounds. No murmur heard.    No friction rub. No gallop.  Pulmonary:     Effort: Pulmonary effort is normal. No respiratory distress.     Breath sounds: Normal breath sounds. No wheezing, rhonchi or rales.  Chest:     Chest wall: No tenderness.  Abdominal:     General: Bowel sounds are normal. There is no distension.     Palpations: Abdomen is  soft. There is no mass.     Tenderness: There is no abdominal tenderness. There is no right CVA tenderness, left CVA tenderness, guarding or rebound.   Musculoskeletal:        General: No swelling or tenderness. Normal range of motion.     Cervical back: Normal range of motion. No rigidity or tenderness.     Right lower leg: No edema.     Left lower leg: No edema.  Lymphadenopathy:     Cervical: No cervical adenopathy.  Skin:    General: Skin is warm and dry.     Coloration: Skin is not pale.     Findings: No bruising, erythema, lesion or rash.  Neurological:     Mental Status: He is alert and oriented to person, place, and time. Mental status is at baseline.     Cranial Nerves: No cranial nerve deficit.     Sensory: No sensory deficit.     Motor: No weakness.     Coordination: Coordination normal.     Gait: Gait abnormal.  Psychiatric:        Mood and Affect: Mood normal.        Speech: Speech normal.        Behavior: Behavior normal.        Thought Content: Thought content normal.        Judgment: Judgment normal.     Labs reviewed: Recent Labs    05/07/22 0327 05/08/22 0227 05/09/22 0224 07/24/22 1432 01/09/23 1048 01/10/23 0212 01/11/23 0211  NA 135 135 137   < > 139 137 140  K 5.5* 5.8* 4.4   < > 3.3* 4.3 4.2  CL 96* 97* 97*   < > 98 100 103  CO2 22 26 27    < > 27 20* 24  GLUCOSE 102* 215* 99   < > 127* 142* 108*  BUN 98* 66* 56*   < > 14 29* 39*  CREATININE 12.34* 9.30* 7.52*   < > 5.97* 8.01* 10.95*  CALCIUM  9.7 9.1 9.3   < > 9.2 8.7* 8.7*  MG 2.2 1.9 1.9  --   --   --   --   PHOS 4.6 4.8* 5.9*  --  4.7* 7.5*  --    < > = values in this interval not displayed.   Recent Labs    05/09/22 0224 07/24/22 1432 01/08/23 1923 01/10/23 0212  AST 12* 12* 13*  --   ALT 17 9 13   --   ALKPHOS 50 82 63  --   BILITOT 0.7 0.6 0.7  --   PROT 6.0* 6.5 6.7  --   ALBUMIN  3.3* 3.9 3.6 2.9*   Recent Labs    01/08/23 1923 01/08/23 2127 01/09/23 0921 01/10/23 0804 01/11/23 0211  WBC 1.4* 3.8* 2.9* 3.2* 2.4*  NEUTROABS 1.0* 2.4  --  2.1  --   HGB 17.1* 8.5* 8.4* 8.3* 7.9*  HCT 53.9* 26.3* 26.0* 24.8* 24.7*   MCV 99.6 101.5* 100.4* 98.4 99.6  PLT 38* 98* 191 152 87*   Lab Results  Component Value Date   TSH 2.245 11/16/2021   Lab Results  Component Value Date   HGBA1C 5.3 05/22/2022   Lab Results  Component Value Date   CHOL 114 05/05/2022   HDL 41 05/05/2022   LDLCALC 56 05/05/2022   TRIG 86 05/05/2022   CHOLHDL 2.8 05/05/2022    Significant Diagnostic Results in last 30 days:  ECHOCARDIOGRAM COMPLETE Result Date: 01/09/2023  ECHOCARDIOGRAM REPORT   Patient Name:   Kirk [name] Date of Exam: 01/09/2023 Medical Rec #:  [id number]       Height:       67.0 in Accession #:    [id number]      Weight:       206.4 lb Date of Birth:  [birth year]        BSA:          2.049 m Patient Age:    62 years        BP:           170/51 mmHg Patient Gender: M               HR:           73 bpm. Exam Location:  Inpatient Procedure: 2D Echo, Cardiac Doppler and Color Doppler Indications:    CHF  History:        Patient has prior history of Echocardiogram examinations, most                 recent 11/16/2021. Risk Factors:Hypertension and Diabetes.  Sonographer:    Lanell Maduro Referring Phys: [id number] JARED M GARDNER IMPRESSIONS  1. Left ventricular ejection fraction, by estimation, is 50 to 55%. The left ventricle has low normal function. The left ventricle has no regional wall motion abnormalities. Left ventricular diastolic parameters are consistent with Grade II diastolic dysfunction (pseudonormalization).  2. Right ventricular systolic function is normal. The right ventricular size is normal.  3. The mitral valve is normal in structure. Trivial mitral valve regurgitation. No evidence of mitral stenosis.  4. The aortic valve is tricuspid. There is moderate calcification of the aortic valve. Aortic valve regurgitation is not visualized. Moderate aortic valve stenosis.  5. The inferior vena cava is dilated in size with >50% respiratory variability, suggesting right atrial pressure of 8 mmHg. FINDINGS  Left  Ventricle: Left ventricular ejection fraction, by estimation, is 50 to 55%. The left ventricle has low normal function. The left ventricle has no regional wall motion abnormalities. The left ventricular internal cavity size was normal in size. There is no left ventricular hypertrophy. Left ventricular diastolic parameters are consistent with Grade II diastolic dysfunction (pseudonormalization). Right Ventricle: The right ventricular size is normal. No increase in right ventricular wall thickness. Right ventricular systolic function is normal. Left Atrium: Left atrial size was normal in size. Right Atrium: Right atrial size was normal in size. Pericardium: There is no evidence of pericardial effusion. Mitral Valve: The mitral valve is normal in structure. Mild mitral annular calcification. Trivial mitral valve regurgitation. No evidence of mitral valve stenosis. Tricuspid Valve: The tricuspid valve is normal in structure. Tricuspid valve regurgitation is trivial. No evidence of tricuspid stenosis. Aortic Valve: The aortic valve is tricuspid. There is moderate calcification of the aortic valve. Aortic valve regurgitation is not visualized. Moderate aortic stenosis is present. Aortic valve mean gradient measures 18.8 mmHg. Aortic valve peak gradient  measures 33.6 mmHg. Aortic valve area, by VTI measures 1.47 cm. Pulmonic Valve: The pulmonic valve was normal in structure. Pulmonic valve regurgitation is trivial. No evidence of pulmonic stenosis. Aorta: The aortic root is normal in size and structure. Venous: The inferior vena cava is dilated in size with greater than 50% respiratory variability, suggesting right atrial pressure of 8 mmHg. IAS/Shunts: No atrial level shunt detected by color flow Doppler.  LEFT VENTRICLE PLAX 2D LVIDd:         6.20 cm  Diastology LVIDs:         3.80 cm      LV e' medial:    4.57 cm/s LV PW:         1.10 cm      LV E/e' medial:  28.0 LV IVS:        1.00 cm      LV e' lateral:   5.77  cm/s LVOT diam:     2.20 cm      LV E/e' lateral: 22.2 LV SV:         92 LV SV Index:   45 LVOT Area:     3.80 cm  LV Volumes (MOD) LV vol d, MOD A2C: 172.0 ml LV vol d, MOD A4C: 156.0 ml LV vol s, MOD A2C: 81.5 ml LV vol s, MOD A4C: 75.7 ml LV SV MOD A2C:     90.5 ml LV SV MOD A4C:     156.0 ml LV SV MOD BP:      82.9 ml RIGHT VENTRICLE             IVC RV Basal diam:  4.60 cm     IVC diam: 2.20 cm RV Mid diam:    2.90 cm RV S prime:     13.20 cm/s TAPSE (M-mode): 2.8 cm LEFT ATRIUM             Index        RIGHT ATRIUM           Index LA diam:        4.10 cm 2.00 cm/m   RA Area:     18.80 cm LA Vol (A2C):   72.6 ml 35.42 ml/m  RA Volume:   53.30 ml  26.01 ml/m LA Vol (A4C):   77.4 ml 37.77 ml/m LA Biplane Vol: 78.7 ml 38.40 ml/m  AORTIC VALVE AV Area (Vmax):    1.49 cm AV Area (Vmean):   1.44 cm AV Area (VTI):     1.47 cm AV Vmax:           290.00 cm/s AV Vmean:          205.200 cm/s AV VTI:            0.630 m AV Peak Grad:      33.6 mmHg AV Mean Grad:      18.8 mmHg LVOT Vmax:         114.00 cm/s LVOT Vmean:        77.900 cm/s LVOT VTI:          0.243 m LVOT/AV VTI ratio: 0.39  AORTA Ao Root diam: 3.00 cm Ao Asc diam:  3.60 cm MITRAL VALVE                TRICUSPID VALVE MV Area (PHT): 4.00 cm     TR Peak grad:   37.2 mmHg MV E velocity: 128.00 cm/s  TR Vmax:        305.00 cm/s MV A velocity: 114.00 cm/s MV E/A ratio:  1.12         SHUNTS                             Systemic VTI:  0.24 m                             Systemic Diam: 2.20 cm Toribio Fuel MD  Electronically signed by Toribio Fuel MD Signature Date/Time: 01/09/2023/12:25:24 PM    Final    CT Head Wo Contrast Result Date: 01/09/2023 CLINICAL DATA:  Headache. EXAM: CT HEAD WITHOUT CONTRAST TECHNIQUE: Contiguous axial images were obtained from the base of the skull through the vertex without intravenous contrast. RADIATION DOSE REDUCTION: This exam was performed according to the departmental dose-optimization program which includes  automated exposure control, adjustment of the mA and/or kV according to patient size and/or use of iterative reconstruction technique. COMPARISON:  CT head 07/30/2022. FINDINGS: Brain: No evidence of acute infarction, hemorrhage, hydrocephalus, extra-axial collection or mass lesion/mass effect. Vascular: No hyperdense vessel or unexpected calcification. Skull: Normal. Negative for fracture or focal lesion. Sinuses/Orbits: No acute finding. Other: None. IMPRESSION: No acute intracranial abnormality. Electronically Signed   By: Greig Pique M.D.   On: 01/09/2023 01:23   CT Angio Chest PE W and/or Wo Contrast Result Date: 01/08/2023 CLINICAL DATA:  Hypoxia and shortness of breath EXAM: CT ANGIOGRAPHY CHEST WITH CONTRAST TECHNIQUE: Multidetector CT imaging of the chest was performed using the standard protocol during bolus administration of intravenous contrast. Multiplanar CT image reconstructions and MIPs were obtained to evaluate the vascular anatomy. RADIATION DOSE REDUCTION: This exam was performed according to the departmental dose-optimization program which includes automated exposure control, adjustment of the mA and/or kV according to patient size and/or use of iterative reconstruction technique. CONTRAST:  75mL OMNIPAQUE  IOHEXOL  350 MG/ML SOLN COMPARISON:  Same day chest radiograph and CT chest 05/01/2022 FINDINGS: Cardiovascular: Negative for acute pulmonary embolism. No pericardial effusion. Coronary artery and aortic atherosclerotic calcification. Mediastinum/Nodes: Trachea and esophagus are unremarkable. Unchanged prominent right hilar and subcarinal lymph node measuring up to 1.0 cm. Lungs/Pleura: Interlobular septal thickening patchy ground-glass opacities greatest in the lower lungs. Bronchial wall thickening with mucous plugging. Centrilobular micro nodules and abutting tree opacities in the lower lungs. Small right and trace left pleural effusions. No pneumothorax. Upper Abdomen: No acute  abnormality. Musculoskeletal: No acute fracture. Review of the MIP images confirms the above findings. IMPRESSION: 1. Negative for acute pulmonary embolism. 2. Airway inflammation/infection and/or aspiration greatest in the lower lobes. Small bilateral pleural effusions. Aortic Atherosclerosis (ICD10-I70.0). Electronically Signed   By: Norman Gatlin M.D.   On: 01/08/2023 22:29   DG Chest Portable 1 View Result Date: 01/08/2023 CLINICAL DATA:  Shortness of breath. EXAM: PORTABLE CHEST 1 VIEW COMPARISON:  Chest radiographs 10/25/2022 and 07/30/2022; CT chest 05/01/2022 FINDINGS: Cardiac silhouette is again mildly enlarged. Trace pericardial fluid was seen on 05/01/2022 chest CT. Chroniclower lung reticular opacities are similar to 10/25/2022. No definite acute airspace opacity. No pleural effusion pneumothorax. No significant skeletal abnormality. IMPRESSION: 1. Chronic lower lung reticular opacities are similar to 10/25/2022 radiograph and 05/01/2022 CT, likely chronic lung disease. No definite acute airspace opacity. 2. Mild cardiomegaly. Electronically Signed   By: Tanda Lyons M.D.   On: 01/08/2023 20:51    Assessment/Plan 1. Community acquired pneumonia, unspecified laterality (Primary) Status post hospitalization from 01/08/2023 -01/11/2023 for bilateral lower lobe community acquired pneumonia after he presented to the ED with a 3-week history  -Symptoms have improved still has shortness of breath lying down.  Will follow-up with checks x-ray on hospital discharge instruction - CBC with Differential/Platelet - Basic metabolic panel - DG Chest 2 View; Future  2. Elevated PSA Wife requests referral to urologist - Ambulatory referral to Urology  3. Type 2 diabetes mellitus with diabetic nephropathy, without long-term current use of insulin  [facility]) Lab Results  Component Value  Date   HGBA1C 5.3 05/22/2022  -Diet controlled -Continue to monitor - Hemoglobin A1c - CBC with  Differential/Platelet - Basic metabolic panel  4. Benign hypertension with ESRD (end-stage renal disease) (HCC) Blood pressure well-controlled -Continue on amlodipine , doxazosin , hydralazine  and metoprolol  - CBC with Differential/Platelet - Basic metabolic panel  5. Chronic bilateral low back pain without sciatica Chronic Requests tramadol  to be managed by PCP -PDMP reviewed.Will have him to sign contract for nonnarcotic use. - traMADol  (ULTRAM ) 50 MG tablet; Take 1 tablet (50 mg total) by mouth every 6 (six) hours as needed.  Dispense: 60 tablet; Refill: 3  Family/ staff Communication: Reviewed plan of care with patient verbalized understanding  Labs/tests ordered:  - Hemoglobin A1c - CBC with Differential/Platelet - Basic metabolic panel - DG Chest 2 View; Future  Next Appointment: Return if symptoms worsen or fail to improve.   Roxan JAYSON Plough, NP

## 2023-01-30 ENCOUNTER — Other Ambulatory Visit: Payer: Self-pay | Admitting: *Deleted

## 2023-01-30 DIAGNOSIS — Z992 Dependence on renal dialysis: Secondary | ICD-10-CM | POA: Diagnosis not present

## 2023-01-30 DIAGNOSIS — N2581 Secondary hyperparathyroidism of renal origin: Secondary | ICD-10-CM | POA: Diagnosis not present

## 2023-01-30 DIAGNOSIS — D61818 Other pancytopenia: Secondary | ICD-10-CM

## 2023-01-30 DIAGNOSIS — D509 Iron deficiency anemia, unspecified: Secondary | ICD-10-CM | POA: Diagnosis not present

## 2023-01-30 DIAGNOSIS — N186 End stage renal disease: Secondary | ICD-10-CM | POA: Diagnosis not present

## 2023-01-30 LAB — CBC WITH DIFFERENTIAL/PLATELET
Absolute Lymphocytes: 675 {cells}/uL — ABNORMAL LOW (ref 850–3900)
Absolute Monocytes: 383 {cells}/uL (ref 200–950)
Basophils Absolute: 30 {cells}/uL (ref 0–200)
Basophils Relative: 1.1 %
Eosinophils Absolute: 70 {cells}/uL (ref 15–500)
Eosinophils Relative: 2.6 %
HCT: 28.9 % — ABNORMAL LOW (ref 38.5–50.0)
Hemoglobin: 9.3 g/dL — ABNORMAL LOW (ref 13.2–17.1)
MCH: 32.7 pg (ref 27.0–33.0)
MCHC: 32.2 g/dL (ref 32.0–36.0)
MCV: 101.8 fL — ABNORMAL HIGH (ref 80.0–100.0)
MPV: 12.2 fL (ref 7.5–12.5)
Monocytes Relative: 14.2 %
Neutro Abs: 1542 {cells}/uL (ref 1500–7800)
Neutrophils Relative %: 57.1 %
Platelets: 98 10*3/uL — ABNORMAL LOW (ref 140–400)
RBC: 2.84 10*6/uL — ABNORMAL LOW (ref 4.20–5.80)
RDW: 14.6 % (ref 11.0–15.0)
Total Lymphocyte: 25 %
WBC: 2.7 10*3/uL — ABNORMAL LOW (ref 3.8–10.8)

## 2023-01-30 LAB — BASIC METABOLIC PANEL
BUN/Creatinine Ratio: 3 (calc) — ABNORMAL LOW (ref 6–22)
BUN: 33 mg/dL — ABNORMAL HIGH (ref 7–25)
CO2: 33 mmol/L — ABNORMAL HIGH (ref 20–32)
Calcium: 8.7 mg/dL (ref 8.6–10.3)
Chloride: 98 mmol/L (ref 98–110)
Creat: 10.02 mg/dL — ABNORMAL HIGH (ref 0.70–1.35)
Glucose, Bld: 88 mg/dL (ref 65–99)
Potassium: 4.2 mmol/L (ref 3.5–5.3)
Sodium: 143 mmol/L (ref 135–146)

## 2023-01-30 LAB — HEMOGLOBIN A1C
Hgb A1c MFr Bld: 5.2 %{Hb}
Mean Plasma Glucose: 103 mg/dL
eAG (mmol/L): 5.7 mmol/L

## 2023-01-31 ENCOUNTER — Inpatient Hospital Stay (HOSPITAL_BASED_OUTPATIENT_CLINIC_OR_DEPARTMENT_OTHER): Payer: Medicare Other | Admitting: Hematology and Oncology

## 2023-01-31 ENCOUNTER — Inpatient Hospital Stay: Payer: Medicare Other | Attending: Internal Medicine

## 2023-01-31 ENCOUNTER — Telehealth: Payer: Self-pay | Admitting: *Deleted

## 2023-01-31 VITALS — BP 168/76 | HR 87 | Temp 98.3°F | Resp 18 | Ht 67.0 in | Wt 199.5 lb

## 2023-01-31 DIAGNOSIS — N186 End stage renal disease: Secondary | ICD-10-CM | POA: Diagnosis not present

## 2023-01-31 DIAGNOSIS — D631 Anemia in chronic kidney disease: Secondary | ICD-10-CM | POA: Diagnosis not present

## 2023-01-31 DIAGNOSIS — I509 Heart failure, unspecified: Secondary | ICD-10-CM | POA: Diagnosis not present

## 2023-01-31 DIAGNOSIS — E1122 Type 2 diabetes mellitus with diabetic chronic kidney disease: Secondary | ICD-10-CM | POA: Diagnosis not present

## 2023-01-31 DIAGNOSIS — D61818 Other pancytopenia: Secondary | ICD-10-CM | POA: Diagnosis not present

## 2023-01-31 DIAGNOSIS — G4700 Insomnia, unspecified: Secondary | ICD-10-CM | POA: Diagnosis not present

## 2023-01-31 DIAGNOSIS — N181 Chronic kidney disease, stage 1: Secondary | ICD-10-CM | POA: Diagnosis not present

## 2023-01-31 DIAGNOSIS — Z992 Dependence on renal dialysis: Secondary | ICD-10-CM | POA: Diagnosis not present

## 2023-01-31 DIAGNOSIS — E876 Hypokalemia: Secondary | ICD-10-CM | POA: Diagnosis not present

## 2023-01-31 DIAGNOSIS — C50929 Malignant neoplasm of unspecified site of unspecified male breast: Secondary | ICD-10-CM | POA: Insufficient documentation

## 2023-01-31 LAB — CBC WITH DIFFERENTIAL (CANCER CENTER ONLY)
Abs Immature Granulocytes: 0.01 10*3/uL (ref 0.00–0.07)
Basophils Absolute: 0.1 10*3/uL (ref 0.0–0.1)
Basophils Relative: 1 %
Eosinophils Absolute: 0.1 10*3/uL (ref 0.0–0.5)
Eosinophils Relative: 3 %
HCT: 31.1 % — ABNORMAL LOW (ref 39.0–52.0)
Hemoglobin: 10.4 g/dL — ABNORMAL LOW (ref 13.0–17.0)
Immature Granulocytes: 0 %
Lymphocytes Relative: 26 %
Lymphs Abs: 0.9 10*3/uL (ref 0.7–4.0)
MCH: 32.8 pg (ref 26.0–34.0)
MCHC: 33.4 g/dL (ref 30.0–36.0)
MCV: 98.1 fL (ref 80.0–100.0)
Monocytes Absolute: 0.5 10*3/uL (ref 0.1–1.0)
Monocytes Relative: 14 %
Neutro Abs: 1.9 10*3/uL (ref 1.7–7.7)
Neutrophils Relative %: 56 %
Platelet Count: 100 10*3/uL — ABNORMAL LOW (ref 150–400)
RBC: 3.17 MIL/uL — ABNORMAL LOW (ref 4.22–5.81)
RDW: 14.8 % (ref 11.5–15.5)
WBC Count: 3.5 10*3/uL — ABNORMAL LOW (ref 4.0–10.5)
nRBC: 0 % (ref 0.0–0.2)

## 2023-01-31 LAB — CMP (CANCER CENTER ONLY)
ALT: 7 U/L (ref 0–44)
AST: 14 U/L — ABNORMAL LOW (ref 15–41)
Albumin: 4.1 g/dL (ref 3.5–5.0)
Alkaline Phosphatase: 69 U/L (ref 38–126)
Anion gap: 10 (ref 5–15)
BUN: 29 mg/dL — ABNORMAL HIGH (ref 8–23)
CO2: 31 mmol/L (ref 22–32)
Calcium: 9.1 mg/dL (ref 8.9–10.3)
Chloride: 98 mmol/L (ref 98–111)
Creatinine: 8.54 mg/dL (ref 0.61–1.24)
GFR, Estimated: 7 mL/min — ABNORMAL LOW (ref >60)
Glucose, Bld: 110 mg/dL — ABNORMAL HIGH (ref 70–99)
Potassium: 3.8 mmol/L (ref 3.5–5.1)
Sodium: 139 mmol/L (ref 135–145)
Total Bilirubin: 0.6 mg/dL (ref 0.0–1.2)
Total Protein: 7.1 g/dL (ref 6.5–8.1)

## 2023-01-31 LAB — VITAMIN B12: Vitamin B-12: 404 pg/mL (ref 180–914)

## 2023-01-31 NOTE — Assessment & Plan Note (Signed)
 03/03/2020: WBC 4.8, hemoglobin 9.8, platelets 84 01/11/2021: WBC 3.6, hemoglobin 11.6, platelets 98, creatinine 7.84 03/21/2021: WBC 2.5, hemoglobin 8.3, MCV 98.5, platelets 73, creatinine 15.97 05/16/2021: WBC 3.1, hemoglobin 11, MCV 100.6, platelets 109, SPEP: No M spike, ANA negative, creatinine 11.65, reticulocyte count absolute 104.8, reticulocyte percentage: 3.1%, erythropoietin  163.8, iron saturation 25%, ferritin 827, folate 7.6, B12 302 08/01/2021: RBC 3, hemoglobin 9.6, platelets 91 08/24/2021: WBC 4.5, hemoglobin 10, platelets 86 03/06/2022: WBC 2.4, hemoglobin 10.7, platelets 48, ANC 1.4, ALC 0.6 07/24/2022: WBC 5.2, hemoglobin 12.2, platelets 114 01/29/2023: WBC 2.7, hemoglobin 9.3, platelets 98   Patient has multiple chronic illnesses including end-stage kidney disease on hemodialysis, Alport syndrome, anemia of chronic kidney disease, CHF, GI bleed, diabetes   03/27/2022: Bone marrow biopsy: Flow cytometry negative, normocellular bone marrow with orderly trilineage hematopoiesis with mild erythroid hyperplasia with left shift, storage iron: Increased histiocytic iron stores/iron overload Hospitalization 01/08/2023-01/11/2023: Acute respiratory failure due to community-acquired pneumonia, hypertensive emergency hemodialysis  for decreased energy: Takes sublingual B12.

## 2023-01-31 NOTE — Telephone Encounter (Signed)
 CRITICAL VALUE STICKER  CRITICAL VALUE: creatine 8.54  RECEIVER (on-site recipient of call):  DATE & TIME NOTIFIED:   MESSENGER (representative from lab): Amber  MD NOTIFIED: yes  TIME OF NOTIFICATION: 1515  RESPONSE:  noted

## 2023-01-31 NOTE — Progress Notes (Addendum)
 Patient Care Team: Ngetich, Roxan [name], NP as PCP - General (Family Medicine) Mona Vinie [name], MD as PCP - Cardiology (Cardiology) Curvin Deward MOULD, MD as Consulting Physician (General Surgery) Shellia Oh, MD (Inactive) as Consulting Physician (Pulmonary Disease) Perri Starleen [name], MD as Consulting Physician (Nephrology) Harvey Carlin BRAVO, MD (Inactive) as Consulting Physician (Vascular Surgery) Mona Vinie [name], MD as Consulting Physician (Cardiology) Teressa Toribio SQUIBB, MD (Inactive) as Attending Physician (Gastroenterology) Betsey Channel, MD as Consulting Physician (Nephrology) Morgan, Clayborne CROME, RN as [facility] Care Management  DIAGNOSIS:  Encounter Diagnoses  Name Primary?   Pancytopenia (HCC) Yes   Chronic kidney disease, stage 1     SUMMARY OF ONCOLOGIC HISTORY: Oncology History   No history exists.    CHIEF COMPLIANT: Follow-up of pancytopenia  HISTORY OF PRESENT ILLNESS:   History of Present Illness   The patient, with a recent hospitalization due to pneumonia and breathing issues, presents for F/U of his pancytopenia. He complains of persistent coughing and shortness of breath. The patient reports improvement in symptoms since hospital discharge. The patient's recent blood work shows improvement in hemoglobin from 7.9 to 10.4, white count from 1.4 to 3.5, and platelets from 38 to 100. The patient also reports sleep issues and has been prescribed Trazodone .         ALLERGIES:  is allergic to codeine and hydrocodone -acetaminophen .  MEDICATIONS:  Current Outpatient Medications  Medication Sig Dispense Refill   acetaminophen  (TYLENOL ) 500 MG tablet Take 1 tablet (500 mg total) by mouth every 6 (six) hours as needed for moderate pain.     albuterol  (PROVENTIL ) (2.5 MG/3ML) 0.083% nebulizer solution Take 3 mLs (2.5 mg total) by nebulization every 6 (six) hours as needed for wheezing or shortness of breath. 75 mL 1   albuterol  (VENTOLIN  HFA) 108 (90 Base)  MCG/ACT inhaler Inhale 2 puffs into the lungs every 6 (six) hours as needed for wheezing or shortness of breath. 8 g 6   amLODipine  (NORVASC ) 10 MG tablet TAKE 1 TABLET BY MOUTH EVERY DAY 90 tablet 3   aspirin  81 MG chewable tablet Chew 81 mg by mouth daily.     calcitRIOL  (ROCALTROL ) 0.5 MCG capsule Take 2 capsules (1 mcg total) by mouth every Monday, Wednesday, and Friday with hemodialysis. 60 capsule 1   cefdinir  (OMNICEF ) 300 MG capsule Take 1 tablet (300 mg) by mouth on Sunday, Tuesday and Friday 3 capsule 0   Cholecalciferol  (VITAMIN D3) 25 MCG (1000 UT) CAPS Take 4,000 Units by mouth daily. Take at home on Tues, Thurs, Sat, and Sun     cinacalcet  (SENSIPAR ) 30 MG tablet Take 2 tablets (60 mg total) by mouth 3 (three) times a week. 60 tablet    diphenhydrAMINE  (BENADRYL ) 25 mg capsule Take 25 mg by mouth every 6 (six) hours as needed for allergies.     doxazosin  (CARDURA ) 8 MG tablet Take 1 tablet (8 mg total) by mouth daily. (Patient taking differently: Take 8 mg by mouth at bedtime.) 90 tablet 3   fluticasone -salmeterol (ADVAIR HFA) 115-21 MCG/ACT inhaler Inhale 2 puffs into the lungs 2 (two) times daily. 1 each 12   FOSRENOL  1000 MG PACK Take 1 packet by mouth with breakfast, with lunch, and with evening meal. Also take when eating snacks     guaiFENesin -dextromethorphan  (ROBITUSSIN DM) 100-10 MG/5ML syrup Take 5 mLs by mouth every 4 (four) hours as needed for cough. 118 mL 0   hydrALAZINE  (APRESOLINE ) 100 MG tablet Take 1 tablet (100  mg total) by mouth 3 (three) times daily. 270 tablet 3   isosorbide  mononitrate (IMDUR ) 120 MG 24 hr tablet Take 1 tablet (120 mg total) by mouth daily. (Patient taking differently: Take 120 mg by mouth at bedtime.) 90 tablet 3   metoprolol  tartrate (LOPRESSOR ) 100 MG tablet TAKE 1 TABLET BY MOUTH TWICE A DAY 180 tablet 1   multivitamin (RENA-VIT) TABS tablet Take 1 tablet by mouth daily. 90 tablet 1   ondansetron  (ZOFRAN -ODT) 4 MG disintegrating tablet TAKE 1  TABLET BY MOUTH EVERY 8 HOURS AS NEEDED FOR NAUSEA AND VOMITING 30 tablet 3   pantoprazole  (PROTONIX ) 40 MG tablet Take 1 tablet (40 mg total) by mouth 2 (two) times daily before a meal. 60 tablet 3   pravastatin  (PRAVACHOL ) 40 MG tablet TAKE 1 TABLET BY MOUTH EVERY DAY IN THE EVENING 90 tablet 2   sevelamer  carbonate (RENVELA ) 800 MG tablet Take 2 tablets (1,600 mg total) by mouth as needed (snacks). (Patient taking differently: Take 1,600 mg by mouth 4 (four) times daily. Take with a meal and snacks)     traMADol  (ULTRAM ) 50 MG tablet Take 1 tablet (50 mg total) by mouth every 6 (six) hours as needed. 60 tablet 3   traZODone  (DESYREL ) 50 MG tablet Take 0.5-1 tablets (25-50 mg total) by mouth at bedtime as needed. for sleep (Patient taking differently: Take 50 mg by mouth at bedtime as needed for sleep. for sleep) 90 tablet 1   No current facility-administered medications for this visit.   Facility-Administered Medications Ordered in Other Visits  Medication Dose Route Frequency Provider Last Rate Last Admin   sodium chloride  flush (NS) 0.9 % injection 10 mL  10 mL Intravenous Q12H Christine Males, PA-C        PHYSICAL EXAMINATION: ECOG PERFORMANCE STATUS: 1 - Symptomatic but completely ambulatory  Vitals:   01/31/23 1432 01/31/23 1433  BP: (!) 195/66 (!) 168/76  Pulse: 87   Resp: 18   Temp: 98.3 F (36.8 C)   SpO2: 99%    Filed Weights   01/31/23 1432  Weight: 199 lb 8 oz (90.5 kg)    Physical Exam   MEASUREMENTS: WT- 203, Ht- 5'6      (exam performed in the presence of a chaperone)  LABORATORY DATA:  I have reviewed the data as listed    Latest Ref Rng & Units 01/31/2023    2:14 PM 01/29/2023   11:30 AM 01/11/2023    2:11 AM  CMP  Glucose 70 - 99 mg/dL 889  88  891   BUN 8 - 23 mg/dL 29  33  39   Creatinine 0.61 - 1.24 mg/dL 1.45  89.97  89.04   Sodium 135 - 145 mmol/L 139  143  140   Potassium 3.5 - 5.1 mmol/L 3.8  4.2  4.2   Chloride 98 - 111 mmol/L 98  98  103    CO2 22 - 32 mmol/L 31  33  24   Calcium  8.9 - 10.3 mg/dL 9.1  8.7  8.7   Total Protein 6.5 - 8.1 g/dL 7.1     Total Bilirubin 0.0 - 1.2 mg/dL 0.6     Alkaline Phos 38 - 126 U/L 69     AST 15 - 41 U/L 14     ALT 0 - 44 U/L 7       Lab Results  Component Value Date   WBC 3.5 (L) 01/31/2023   HGB 10.4 (L) 01/31/2023  HCT 31.1 (L) 01/31/2023   MCV 98.1 01/31/2023   PLT 100 (L) 01/31/2023   NEUTROABS 1.9 01/31/2023    ASSESSMENT & PLAN:  Pancytopenia (HCC) 03/03/2020: WBC 4.8, hemoglobin 9.8, platelets 84 01/11/2021: WBC 3.6, hemoglobin 11.6, platelets 98, creatinine 7.84 03/21/2021: WBC 2.5, hemoglobin 8.3, MCV 98.5, platelets 73, creatinine 15.97 05/16/2021: WBC 3.1, hemoglobin 11, MCV 100.6, platelets 109, SPEP: No M spike, ANA negative, creatinine 11.65, reticulocyte count absolute 104.8, reticulocyte percentage: 3.1%, erythropoietin  163.8, iron saturation 25%, ferritin 827, folate 7.6, B12 302 08/01/2021: RBC 3, hemoglobin 9.6, platelets 91 08/24/2021: WBC 4.5, hemoglobin 10, platelets 86 03/06/2022: WBC 2.4, hemoglobin 10.7, platelets 48, ANC 1.4, ALC 0.6 07/24/2022: WBC 5.2, hemoglobin 12.2, platelets 114 01/29/2023: WBC 2.7, hemoglobin 9.3, platelets 98   Patient has multiple chronic illnesses including end-stage kidney disease on hemodialysis, Alport syndrome, anemia of chronic kidney disease, CHF, GI bleed, diabetes   03/27/2022: Bone marrow biopsy: Flow cytometry negative, normocellular bone marrow with orderly trilineage hematopoiesis with mild erythroid hyperplasia with left shift, storage iron: Increased histiocytic iron stores/iron overload Hospitalization 01/08/2023-01/11/2023: Acute respiratory failure due to community-acquired pneumonia, hypertensive emergency hemodialysis  for decreased energy: Takes sublingual B12. ------------------------------------- Assessment and Plan    Recent hospitalization for pneumonia and dysphagia. 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 Hemoglobin, white count, and platelets improving post-infection. -Consider placement of PEG tube for nutritional support due to decreased appetite and dysphagia. -Check iron levels at next visit.  Pneumonia Recent hospitalization with subsequent improvement in symptoms. -Follow up with pulmonologist next month.  Insomnia Difficulty sleeping, previously on Trazodone  which is currently unavailable. -Prescribe Ambien  as needed for sleep.  Hypokalemia Recent low potassium levels, difficulty with pill intake. -Encourage consistent intake of potassium medication. -Consider alternative methods of intake (e.g., in applesauce, ice cream, pudding).  General Health Maintenance -Encourage high protein diet and hydration. -Administer Neulasta shot on 02/07/2023. -Check blood pressure medication and other routine medications at home. -Follow up appointment in 6 months.          Orders Placed This Encounter  Procedures   CBC with Differential (Cancer Center Only)    Standing Status:   Future    Expiration Date:   01/31/2024   Iron and Iron Binding Capacity ([facility] only)    Standing Status:   Future    Expiration Date:   01/31/2024   CMP (Cancer Center only)    Standing Status:   Future    Expiration Date:   01/31/2024   Ferritin    Standing Status:   Future    Expiration Date:   01/31/2024   Vitamin B12    Standing Status:   Future    Expiration Date:   01/31/2024   The patient has a good understanding of the overall plan. he agrees with it. he will call with any problems that may develop before the next visit here. Total time spent: 30 mins including face to face time and time spent for planning, charting and co-ordination of care   Naomi MARLA Chad, MD 01/31/23

## 2023-02-01 ENCOUNTER — Telehealth: Payer: Self-pay | Admitting: Family

## 2023-02-01 DIAGNOSIS — N2581 Secondary hyperparathyroidism of renal origin: Secondary | ICD-10-CM | POA: Diagnosis not present

## 2023-02-01 DIAGNOSIS — N186 End stage renal disease: Secondary | ICD-10-CM | POA: Diagnosis not present

## 2023-02-01 DIAGNOSIS — D509 Iron deficiency anemia, unspecified: Secondary | ICD-10-CM | POA: Diagnosis not present

## 2023-02-01 DIAGNOSIS — Z992 Dependence on renal dialysis: Secondary | ICD-10-CM | POA: Diagnosis not present

## 2023-02-01 NOTE — Telephone Encounter (Signed)
 Forms brought during the visit to be completed for disability.forms completed and signed.Please call patient's wife to pickup forms.

## 2023-02-04 DIAGNOSIS — Z992 Dependence on renal dialysis: Secondary | ICD-10-CM | POA: Diagnosis not present

## 2023-02-04 DIAGNOSIS — D509 Iron deficiency anemia, unspecified: Secondary | ICD-10-CM | POA: Diagnosis not present

## 2023-02-04 DIAGNOSIS — N2581 Secondary hyperparathyroidism of renal origin: Secondary | ICD-10-CM | POA: Diagnosis not present

## 2023-02-04 DIAGNOSIS — N186 End stage renal disease: Secondary | ICD-10-CM | POA: Diagnosis not present

## 2023-02-06 DIAGNOSIS — D509 Iron deficiency anemia, unspecified: Secondary | ICD-10-CM | POA: Diagnosis not present

## 2023-02-06 DIAGNOSIS — N186 End stage renal disease: Secondary | ICD-10-CM | POA: Diagnosis not present

## 2023-02-06 DIAGNOSIS — Z992 Dependence on renal dialysis: Secondary | ICD-10-CM | POA: Diagnosis not present

## 2023-02-06 DIAGNOSIS — N2581 Secondary hyperparathyroidism of renal origin: Secondary | ICD-10-CM | POA: Diagnosis not present

## 2023-02-07 ENCOUNTER — Other Ambulatory Visit (HOSPITAL_BASED_OUTPATIENT_CLINIC_OR_DEPARTMENT_OTHER): Payer: Medicare Other

## 2023-02-08 ENCOUNTER — Ambulatory Visit: Payer: Self-pay | Admitting: Licensed Clinical Social Worker

## 2023-02-08 DIAGNOSIS — D509 Iron deficiency anemia, unspecified: Secondary | ICD-10-CM | POA: Diagnosis not present

## 2023-02-08 DIAGNOSIS — Z992 Dependence on renal dialysis: Secondary | ICD-10-CM | POA: Diagnosis not present

## 2023-02-08 DIAGNOSIS — N2581 Secondary hyperparathyroidism of renal origin: Secondary | ICD-10-CM | POA: Diagnosis not present

## 2023-02-08 DIAGNOSIS — N186 End stage renal disease: Secondary | ICD-10-CM | POA: Diagnosis not present

## 2023-02-08 NOTE — Patient Outreach (Signed)
  Care Coordination   02/08/2023 Name: LONDEN TOLER MRN: [id number] DOB: 01-24-1960   Care Coordination Outreach Attempts:  An unsuccessful outreach was attempted for an appointment today.  Follow Up Plan:  Additional outreach attempts will be made to offer the patient complex care management information and services.   Encounter Outcome:  No Answer   Care Coordination Interventions:  No, not indicated    Jeanie Cooks, PhD [facility], [facility] Social Worker Direct Dial: [phone]  Fax: [phone]

## 2023-02-11 DIAGNOSIS — D509 Iron deficiency anemia, unspecified: Secondary | ICD-10-CM | POA: Diagnosis not present

## 2023-02-11 DIAGNOSIS — Z992 Dependence on renal dialysis: Secondary | ICD-10-CM | POA: Diagnosis not present

## 2023-02-11 DIAGNOSIS — N186 End stage renal disease: Secondary | ICD-10-CM | POA: Diagnosis not present

## 2023-02-11 DIAGNOSIS — N2581 Secondary hyperparathyroidism of renal origin: Secondary | ICD-10-CM | POA: Diagnosis not present

## 2023-02-13 DIAGNOSIS — D509 Iron deficiency anemia, unspecified: Secondary | ICD-10-CM | POA: Diagnosis not present

## 2023-02-13 DIAGNOSIS — N2581 Secondary hyperparathyroidism of renal origin: Secondary | ICD-10-CM | POA: Diagnosis not present

## 2023-02-13 DIAGNOSIS — N186 End stage renal disease: Secondary | ICD-10-CM | POA: Diagnosis not present

## 2023-02-13 DIAGNOSIS — Z992 Dependence on renal dialysis: Secondary | ICD-10-CM | POA: Diagnosis not present

## 2023-02-13 NOTE — Telephone Encounter (Signed)
Noted  

## 2023-02-15 ENCOUNTER — Telehealth: Payer: Self-pay | Admitting: *Deleted

## 2023-02-15 DIAGNOSIS — N186 End stage renal disease: Secondary | ICD-10-CM | POA: Diagnosis not present

## 2023-02-15 DIAGNOSIS — Z992 Dependence on renal dialysis: Secondary | ICD-10-CM | POA: Diagnosis not present

## 2023-02-15 DIAGNOSIS — N2581 Secondary hyperparathyroidism of renal origin: Secondary | ICD-10-CM | POA: Diagnosis not present

## 2023-02-15 DIAGNOSIS — D509 Iron deficiency anemia, unspecified: Secondary | ICD-10-CM | POA: Diagnosis not present

## 2023-02-15 NOTE — Progress Notes (Unsigned)
Complex Care Management Care Guide Note  02/15/2023 Name: HYKEEM OJEDA MRN: [id number] DOB: Mar 20, 1960  [name] is a 63 y.o. year old male who is a primary care patient of Ngetich, Dinah C, NP and is actively engaged with the care management team. I reached out to [name] by phone today to assist with re-scheduling  with the RN Case Manager.  Follow up plan: Unsuccessful telephone outreach attempt made.   Gwenevere Ghazi  [facility], [facility] Guide  Direct Dial: [phone]  Fax [phone]

## 2023-02-18 DIAGNOSIS — D509 Iron deficiency anemia, unspecified: Secondary | ICD-10-CM | POA: Diagnosis not present

## 2023-02-18 DIAGNOSIS — N186 End stage renal disease: Secondary | ICD-10-CM | POA: Diagnosis not present

## 2023-02-18 DIAGNOSIS — Z992 Dependence on renal dialysis: Secondary | ICD-10-CM | POA: Diagnosis not present

## 2023-02-18 DIAGNOSIS — N2581 Secondary hyperparathyroidism of renal origin: Secondary | ICD-10-CM | POA: Diagnosis not present

## 2023-02-20 ENCOUNTER — Telehealth: Payer: Self-pay

## 2023-02-20 ENCOUNTER — Other Ambulatory Visit: Payer: Self-pay | Admitting: Family

## 2023-02-20 DIAGNOSIS — N186 End stage renal disease: Secondary | ICD-10-CM | POA: Diagnosis not present

## 2023-02-20 DIAGNOSIS — R1310 Dysphagia, unspecified: Secondary | ICD-10-CM

## 2023-02-20 DIAGNOSIS — D509 Iron deficiency anemia, unspecified: Secondary | ICD-10-CM | POA: Diagnosis not present

## 2023-02-20 DIAGNOSIS — Z992 Dependence on renal dialysis: Secondary | ICD-10-CM | POA: Diagnosis not present

## 2023-02-20 DIAGNOSIS — N2581 Secondary hyperparathyroidism of renal origin: Secondary | ICD-10-CM | POA: Diagnosis not present

## 2023-02-20 NOTE — Telephone Encounter (Signed)
Recommend referral to gastroenterologist for evaluation

## 2023-02-20 NOTE — Telephone Encounter (Signed)
Below is the diagnosis previously associated with swallowing test order placed by [facility], per [name] no one never followed up on the order and now the PCP has to place an order:  Associated Diagnoses  Acute hypoxic respiratory failure (HCC)

## 2023-02-20 NOTE — Telephone Encounter (Signed)
Having trouble swallowing or coughing when swallowing ?

## 2023-02-20 NOTE — Progress Notes (Signed)
Complex Care Management Care Guide Note  02/20/2023 Name: CAMILO MANDER MRN: [id number] DOB: Jun 28, 1960  [name] is a 63 y.o. year old male who is a primary care patient of Ngetich, Dinah C, NP and is actively engaged with the care management team. I reached out to [name] by phone today to assist with re-scheduling  with the RN Case Manager.  Follow up plan: Telephone appointment with complex care management team member scheduled for:  2/27 with RNCM and 2/18 with BSW   Gwenevere Ghazi  [facility], [facility] Guide  Direct Dial: [phone]  Fax [phone]

## 2023-02-20 NOTE — Telephone Encounter (Signed)
Patients spouse called requesting order for swallowing test

## 2023-02-21 NOTE — Telephone Encounter (Signed)
Left message on voicemail for patient to return call when available

## 2023-02-22 DIAGNOSIS — N186 End stage renal disease: Secondary | ICD-10-CM | POA: Diagnosis not present

## 2023-02-22 DIAGNOSIS — Z992 Dependence on renal dialysis: Secondary | ICD-10-CM | POA: Diagnosis not present

## 2023-02-22 DIAGNOSIS — N2581 Secondary hyperparathyroidism of renal origin: Secondary | ICD-10-CM | POA: Diagnosis not present

## 2023-02-22 DIAGNOSIS — D509 Iron deficiency anemia, unspecified: Secondary | ICD-10-CM | POA: Diagnosis not present

## 2023-02-23 DIAGNOSIS — T8612 Kidney transplant failure: Secondary | ICD-10-CM | POA: Diagnosis not present

## 2023-02-23 DIAGNOSIS — N186 End stage renal disease: Secondary | ICD-10-CM | POA: Diagnosis not present

## 2023-02-23 DIAGNOSIS — Z992 Dependence on renal dialysis: Secondary | ICD-10-CM | POA: Diagnosis not present

## 2023-02-25 DIAGNOSIS — D509 Iron deficiency anemia, unspecified: Secondary | ICD-10-CM | POA: Diagnosis not present

## 2023-02-25 DIAGNOSIS — N2581 Secondary hyperparathyroidism of renal origin: Secondary | ICD-10-CM | POA: Diagnosis not present

## 2023-02-25 DIAGNOSIS — N186 End stage renal disease: Secondary | ICD-10-CM | POA: Diagnosis not present

## 2023-02-25 DIAGNOSIS — Z992 Dependence on renal dialysis: Secondary | ICD-10-CM | POA: Diagnosis not present

## 2023-02-26 ENCOUNTER — Encounter: Payer: Self-pay | Admitting: Licensed Clinical Social Worker

## 2023-02-27 DIAGNOSIS — N186 End stage renal disease: Secondary | ICD-10-CM | POA: Diagnosis not present

## 2023-02-27 DIAGNOSIS — D509 Iron deficiency anemia, unspecified: Secondary | ICD-10-CM | POA: Diagnosis not present

## 2023-02-27 DIAGNOSIS — Z992 Dependence on renal dialysis: Secondary | ICD-10-CM | POA: Diagnosis not present

## 2023-02-27 DIAGNOSIS — N2581 Secondary hyperparathyroidism of renal origin: Secondary | ICD-10-CM | POA: Diagnosis not present

## 2023-02-28 ENCOUNTER — Encounter: Payer: Self-pay | Admitting: Primary Care

## 2023-02-28 ENCOUNTER — Ambulatory Visit: Payer: Medicare Other

## 2023-02-28 ENCOUNTER — Ambulatory Visit (INDEPENDENT_AMBULATORY_CARE_PROVIDER_SITE_OTHER): Payer: Medicare Other | Admitting: Primary Care

## 2023-02-28 VITALS — BP 182/74 | HR 62 | Temp 97.1°F | Ht 67.0 in | Wt 201.2 lb

## 2023-02-28 DIAGNOSIS — J4530 Mild persistent asthma, uncomplicated: Secondary | ICD-10-CM | POA: Diagnosis not present

## 2023-02-28 DIAGNOSIS — G4733 Obstructive sleep apnea (adult) (pediatric): Secondary | ICD-10-CM

## 2023-02-28 DIAGNOSIS — J690 Pneumonitis due to inhalation of food and vomit: Secondary | ICD-10-CM | POA: Diagnosis not present

## 2023-02-28 NOTE — Progress Notes (Signed)
 @Patient  ID: Beryl KATHEE List, male    DOB: 05-08-1960, 63 y.o.   MRN: [id number]  No chief complaint on file.   Referring provider: Leonarda Roxan [name], NP  HPI: 63 year old male, Former smoker quit in April 2022 (16-pack-year history).  Past medical history significant for mild persistent asthma, systolic heart failure, hypertension, OSA on CPAP, type 2 diabetes, end-stage renal disease on dialysis.  Patient of Dr. Darlean.  Maintained on Symbicort  160, as needed albuterol  HFA or DuoNeb.  Spirometry in February 2023 showed moderate airway obstruction/FEV 1 42%.   Previous [facility] pulmonary encounter: 09/18/2022 Patient presents today for overdue follow-up asthma. Patient follows with Dr. Darlean. Reports increased shortness of breath symptoms with exertion and cough since having rhinovirus and RSV in April 2024. These symptoms are not new. He gets winded when walking. He ran out of symbicort  last month. Symptoms were previously well controlled. He has associated sinus congestion/PND. Cough is productive with clear sputum is clear. He has not tried any nasal sprays. CT chest in April 2024 showed mucus plugging, mild consolidations and numerous small pulmonary nodules most prominent in lower lungs, slightly worse when compared to prior exam.  Findings felt to be likely due to infection or aspiration. CXR in July without acute cardiopulmonary process.  10/23/2022 Patient was treated for an episode of bronchitis back in August with Doxycycline  and Robitussin DM. Accompanied by his wife/ daughter. He reports that his cough is no better. He has a persistent daily cough daily which is occasionally productive. Dyspnea is prominently with exertion. Associated wheezing. He is maintained on Symbicort  160. CXR in July was normal   Recommendations: Take mucinex  600-1200mg  twice daily x 5-7 days  (not with robitussin)   Orders: Depo-medrol  80mg  IM x1 CXR and labs    Rx: Prednisone  taper    Follow-up: 2-4 weeks with  Beth NP    11/13/2022 Patient presents today for 2-4 week follow-up bronchitis. Seen on 10/23/22. Received depo-medrol  80mg  IM x1 and prednisone  taper. CXR ordered but not completed. He was seen in ED on 10/25/22 for acute bronchospasm, tachypnea and wheezing noted.  Rceived albuterol  5mg  and atrovent  nebulizer. Provided with aeroschamer, lost his nebulizer during move. CXR showed reticulation lower lungs which correlated with chronic lung disease seen with prior CT, no acute superimposed process.   02/28/2023 Discussed the use of AI scribe software for clinical note transcription with the patient, who gave verbal consent to proceed.  Patient presents today for routine follow-up.  He was last seen in October. Patient has experienced worsening breathing difficulties since his hospitalization for pneumonia. Patient was admitted in December for hypoxic respiratory failure secondary to aspiration pneumonia and fluid volume overload.  Patient was treated with antibiotics.  Volume removed with hemodialysis.  Patient was weaned off oxygen .  Discharged on 5-day course of cefdinir .   His breathing is particularly challenging during physical activities like walking or climbing stairs. No productive cough or mucus production, but he does experience episodes of coughing after eating.  He manages his asthma with Advair, two puffs twice a day, and a rescue inhaler as needed. He has not undergone pulmonary rehabilitation. He is currently without a nebulizer machine and requires a new flutter valve for mucus clearance, particularly after nebulizer treatments.  He uses a CPAP machine for sleep apnea but reports issues with the machine causing him to breathe too hard at times. No compliance report available on airview. He has not had a recent sleep study since 2016, despite losing  30 pounds since then. He experiences panic attacks when not using his CPAP, which have become more frequent in the last three weeks.  There is  a concern for potential aspiration, as it was suggested during his hospital stay that he might be aspirating food. He was supposed to be evaluated by a speech-language pathologist in December, but consult was cancelled due to patient sleeping. No follow-up appointment for a swallow study has been scheduled.  He is currently without a nebulizer machine, which he has been unable to locate, and requires a new flutter valve after his dog damaged the previous one.         Imaging: 01/08/23 CTA >>> Negative for pulmonary embolism, interlobular septal thickening patchy groundglass opacity greatest in lower lungs.  Bronchial wall thickening with mucous plugging.  Centrilobular micronodules and abutting tree opacities in lower lungs.  Small right and trace left pleural effusion.  05/01/22 CT chest wo contrast >> mucous plugging, mild consolidations with numerous small pulmonary nodules most pronounced in lower lungs. Findings likely due to infection or aspiration.    Allergies  Allergen Reactions   Codeine Nausea And Vomiting   Hydrocodone -Acetaminophen  Nausea And Vomiting    Immunization History  Administered Date(s) Administered   Influenza Whole 11/01/2010   Influenza, Seasonal, Injecte, Preservative Fre 10/29/2012   Influenza-Unspecified 11/21/2011, 11/05/2012, 09/22/2013, 09/22/2017, 11/23/2019, 10/28/2020, 12/04/2020   Moderna Sars-Covid-2 Vaccination 04/06/2019, 05/07/2019, 03/31/2020   PPD Test 12/26/2012   Pneumococcal Polysaccharide-23 11/01/2010, 10/29/2012   Pneumococcal-Unspecified 11/05/2012   Tdap 01/22/2006, 06/08/2021    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)    Anxiety    Asthma    Asthma    moderate persistent   Carpal tunnel syndrome    Cellulitis and abscess of trunk    Cholelithiasis 07/13/2014   Chronic headaches     Cigarette smoker 07/11/2017   Stopped 2/022  - referred to start Lung cancer screening 04/2021         Debility, unspecified    Dermatophytosis of the body    Dysrhythmia    history of   Edema    End stage renal disease on dialysis (HCC)    MWF; [facility] in [place] (10/21/2014)   Essential hypertension, benign    Generalized anxiety disorder 05/25/2022   GERD (gastroesophageal reflux disease)    Gout, unspecified    HTN (hypertension)    Hypertrophy of prostate without urinary obstruction and other lower urinary tract symptoms (LUTS)    Hypotension, unspecified    Impotence of organic origin    Insomnia, unspecified    Kidney replaced by transplant    Localization-related (focal) (partial) epilepsy and epileptic syndromes with complex partial seizures, without mention of intractable epilepsy    12-15-19- Wife states he has NEVER had a seizure    Lumbago    Memory loss    OSA on CPAP    Other and unspecified hyperlipidemia    controlled /managed per wife    Other chronic nonalcoholic liver disease    Other malaise and fatigue    Other nonspecific abnormal serum enzyme levels    Pain in joint, lower leg    Pain in joint, upper arm    Pneumonia several times   PONV (postoperative nausea and vomiting)    Renal dialysis status(V45.11) 02/05/2010   restarted  01/02/13 ofter renal trransplant failure   Secondary hyperparathyroidism (of renal origin)    Shortness of breath    Sleep apnea    wears cpap    Tension headache    Unspecified constipation    Unspecified essential hypertension    Unspecified hereditary and idiopathic peripheral neuropathy    Unspecified vitamin D  deficiency     Tobacco History: Social History   Tobacco Use  Smoking Status Former   Current packs/day: 0.00   Average packs/day: 0.5 packs/day for 32.0 years (16.0 ttl pk-yrs)   Types: Cigarettes   Start date: 19   Quit date: 02/24/2020   Years since quitting: 3.0  Smokeless Tobacco  Never   Counseling given: Not Answered   Outpatient Medications Prior to Visit  Medication Sig Dispense Refill   acetaminophen  (TYLENOL ) 500 MG tablet Take 1 tablet (500 mg total) by mouth every 6 (six) hours as needed for moderate pain.     albuterol  (PROVENTIL ) (2.5 MG/3ML) 0.083% nebulizer solution Take 3 mLs (2.5 mg total) by nebulization every 6 (six) hours as needed for wheezing or shortness of breath. 75 mL 1   albuterol  (VENTOLIN  HFA) 108 (90 Base) MCG/ACT inhaler Inhale 2 puffs into the lungs every 6 (six) hours as needed for wheezing or shortness of breath. 8 g 6   amLODipine  (NORVASC ) 10 MG tablet TAKE 1 TABLET BY MOUTH EVERY DAY 90 tablet 3   aspirin  81 MG chewable tablet Chew 81 mg by mouth daily.     calcitRIOL  (ROCALTROL ) 0.5 MCG capsule Take 2 capsules (1 mcg total) by mouth every Monday, Wednesday, and Friday with hemodialysis. 60 capsule 1   cefdinir  (OMNICEF ) 300 MG capsule Take 1 tablet (300 mg) by mouth on Sunday, Tuesday and Friday 3 capsule 0   Cholecalciferol  (VITAMIN D3) 25 MCG (1000 UT) CAPS Take 4,000 Units by mouth daily. Take at home on Tues, Thurs, Sat, and Sun     cinacalcet  (SENSIPAR ) 30 MG tablet Take 2 tablets (60 mg total) by mouth 3 (three) times a week. 60 tablet    diphenhydrAMINE  (BENADRYL ) 25 mg capsule Take 25 mg by mouth every 6 (six) hours as needed for allergies.     doxazosin  (CARDURA ) 8 MG tablet Take 1 tablet (8 mg total) by mouth daily. (Patient taking differently: Take 8 mg by mouth at bedtime.) 90 tablet 3   fluticasone -salmeterol (ADVAIR HFA) 115-21 MCG/ACT inhaler Inhale 2 puffs into the lungs 2 (two) times daily. 1 each 12   FOSRENOL  1000 MG PACK Take 1 packet by mouth with breakfast, with lunch, and with evening meal. Also take when eating snacks     guaiFENesin -dextromethorphan  (ROBITUSSIN DM) 100-10 MG/5ML syrup Take 5 mLs by mouth every 4 (four) hours as needed for cough. 118 mL 0   hydrALAZINE  (APRESOLINE ) 100 MG tablet Take 1 tablet (100  mg total) by mouth 3 (three) times daily. 270 tablet 3   isosorbide  mononitrate (IMDUR ) 120 MG 24 hr tablet Take 1 tablet (120 mg total) by mouth daily. (Patient taking differently: Take 120 mg by mouth at bedtime.) 90 tablet 3   metoprolol  tartrate (LOPRESSOR ) 100 MG tablet TAKE 1 TABLET BY MOUTH TWICE A DAY 180 tablet 1   multivitamin (RENA-VIT) TABS tablet Take 1 tablet by mouth daily. 90 tablet 1   ondansetron  (ZOFRAN -ODT) 4 MG disintegrating tablet TAKE 1 TABLET BY MOUTH EVERY 8 HOURS AS NEEDED FOR NAUSEA AND VOMITING 30 tablet 3   pantoprazole  (PROTONIX ) 40 MG tablet Take 1 tablet (  40 mg total) by mouth 2 (two) times daily before a meal. 60 tablet 3   pravastatin  (PRAVACHOL ) 40 MG tablet TAKE 1 TABLET BY MOUTH EVERY DAY IN THE EVENING 90 tablet 2   sevelamer  carbonate (RENVELA ) 800 MG tablet Take 2 tablets (1,600 mg total) by mouth as needed (snacks). (Patient taking differently: Take 1,600 mg by mouth 4 (four) times daily. Take with a meal and snacks)     traMADol  (ULTRAM ) 50 MG tablet Take 1 tablet (50 mg total) by mouth every 6 (six) hours as needed. 60 tablet 3   traZODone  (DESYREL ) 50 MG tablet Take 0.5-1 tablets (25-50 mg total) by mouth at bedtime as needed. for sleep (Patient taking differently: Take 50 mg by mouth at bedtime as needed for sleep. for sleep) 90 tablet 1   Facility-Administered Medications Prior to Visit  Medication Dose Route Frequency Provider Last Rate Last Admin   sodium chloride  flush (NS) 0.9 % injection 10 mL  10 mL Intravenous Q12H Christine Males, PA-C        Review of Systems  Review of Systems  Constitutional: Negative.   HENT: Negative.    Respiratory:  Positive for cough and shortness of breath.   Cardiovascular:  Negative for leg swelling.  Psychiatric/Behavioral:  Positive for sleep disturbance.    Physical Exam  There were no vitals taken for this visit. Physical Exam Constitutional:      General: He is not in acute distress.    Appearance:  Normal appearance. He is not ill-appearing.  HENT:     Head: Normocephalic and atraumatic.     Mouth/Throat:     Mouth: Mucous membranes are moist.     Pharynx: Oropharynx is clear.  Cardiovascular:     Rate and Rhythm: Normal rate and regular rhythm.  Pulmonary:     Effort: Pulmonary effort is normal.     Breath sounds: Normal breath sounds. No wheezing or rhonchi.     Comments: Clear to auscultation  Musculoskeletal:        General: Normal range of motion.     Right lower leg: No edema.     Left lower leg: No edema.  Skin:    General: Skin is warm and dry.     Comments: Left arm fistula covered with clean dry dressing   Neurological:     General: No focal deficit present.     Mental Status: He is alert and oriented to person, place, and time. Mental status is at baseline.  Psychiatric:        Mood and Affect: Mood normal.        Behavior: Behavior normal.        Thought Content: Thought content normal.        Judgment: Judgment normal.      Lab Results:  CBC    Component Value Date/Time   WBC 3.5 (L) 01/31/2023 1414   WBC 2.7 (L) 01/29/2023 1130   RBC 3.17 (L) 01/31/2023 1414   HGB 10.4 (L) 01/31/2023 1414   HCT 31.1 (L) 01/31/2023 1414   PLT 100 (L) 01/31/2023 1414   MCV 98.1 01/31/2023 1414   MCH 32.8 01/31/2023 1414   MCHC 33.4 01/31/2023 1414   RDW 14.8 01/31/2023 1414   RDW 16.7 (H) 11/26/2013 0914   LYMPHSABS 0.9 01/31/2023 1414   LYMPHSABS 1.3 11/26/2013 0914   MONOABS 0.5 01/31/2023 1414   EOSABS 0.1 01/31/2023 1414   EOSABS 0.2 11/26/2013 0914   BASOSABS 0.1 01/31/2023 1414  BASOSABS 0.1 11/26/2013 0914    BMET    Component Value Date/Time   NA 139 01/31/2023 1414   NA 136 11/26/2013 0914   K 3.8 01/31/2023 1414   CL 98 01/31/2023 1414   CO2 31 01/31/2023 1414   GLUCOSE 110 (H) 01/31/2023 1414   BUN 29 (H) 01/31/2023 1414   BUN 66 (A) 05/25/2015 0000   BUN 66 (A) 05/25/2015 0000   CREATININE 8.54 (HH) 01/31/2023 1414   CREATININE 10.02  (H) 01/29/2023 1130   CALCIUM  9.1 01/31/2023 1414   CALCIUM  8.8 06/08/2015 0000   CALCIUM  7.9 (L) 01/19/2010 1016   GFRNONAA 7 (L) 01/31/2023 1414   GFRAA 4 (L) 07/15/2019 0704    BNP    Component Value Date/Time   BNP 1,590.5 (H) 01/08/2023 1923    ProBNP    Component Value Date/Time   PROBNP 725.4 (H) 08/04/2011 0520    Imaging: No results found.   Assessment & Plan:   1. Aspiration pneumonia of both lower lobes, unspecified aspiration pneumonia type (HCC) (Primary) - SLP eval and treat; Future - DG Chest 2 View; Future - Pulmonary function test; Future - AMB REFERRAL FOR DME  2. OSA on CPAP - Split night study; Future  3. Mild persistent chronic asthma without complication - Pulmonary function test; Future - AMB REFERRAL FOR DME     Asthma - Worsening shortness of breath, especially with exertion. Currently on Advair 2 puffs twice daily and rescue inhaler as needed. -Continue Advair HFA 115-21mcg two puffs twice daily  -Advise patient to use nebulizer 2-3 times daily for breakthrough shortness of breath or wheezing, followed by use of flutter valve.  -Order pulmonary function test to assess lung function   Obstructive Sleep Apnea - Patient on CPAP therapy, but reports poor sleep and possible issues with machine settings. Order split night sleep study to reevaluate for OSA due to weight loss and current symptoms.  Aspiration Risk - History of pneumonia in December, possibly due to aspiration. Patient reports coughing after eating. Getting follow-up CXR today. Refer to speech and language pathologist for swallowing evaluation.  Nebulizer Therapy Patient reports loss of nebulizer machine and needs replacement. -Order new nebulizer machine from medical supply store.  General Health Maintenance -Continue current medications and therapies. -Follow-up after completion of ordered tests and evaluations.      Almarie LELON Ferrari, NP 02/28/2023

## 2023-02-28 NOTE — Patient Instructions (Addendum)
-  ASTHMA: Asthma is a condition where your airways narrow and swell, making it difficult to breathe. We will continue your current medications, Advair and your rescue inhaler, and have ordered a pulmonary function test to better understand your asthma control.  -OBSTRUCTIVE SLEEP APNEA: Obstructive Sleep Apnea is a condition where your breathing repeatedly stops and starts during sleep. We have ordered a split night sleep study to reassess your condition, especially considering your recent weight loss and current symptoms.  -ASPIRATION RISK: Aspiration occurs when food or liquid enters your airway. Given your history of pneumonia and coughing after eating, we have referred you to a speech and language pathologist for a swallowing evaluation.  -VOLUME OVERLOAD: Volume overload happens when there is too much fluid in your body, which can affect your heart and lungs. We have ordered a chest x-ray to check your current fluid status.  -NEBULIZER THERAPY: A nebulizer helps deliver medication directly to your lungs. Since you need a replacement nebulizer machine and a new flutter valve, we have ordered these for you and advised you to use the nebulizer 2-3 times daily for shortness of breath or wheezing, followed by the flutter valve.  INSTRUCTIONS:  Please follow up after you have completed the pulmonary function test, split night sleep study, swallowing evaluation, and chest x-ray. Use the nebulizer 2-3 times daily for breakthrough shortness of breath or wheezing, followed by the flutter valve.  Follow-up 2-3 months with Dr. ALPHA 1 HOUR PFT PRIOR

## 2023-03-01 DIAGNOSIS — Z992 Dependence on renal dialysis: Secondary | ICD-10-CM | POA: Diagnosis not present

## 2023-03-01 DIAGNOSIS — N186 End stage renal disease: Secondary | ICD-10-CM | POA: Diagnosis not present

## 2023-03-01 DIAGNOSIS — N2581 Secondary hyperparathyroidism of renal origin: Secondary | ICD-10-CM | POA: Diagnosis not present

## 2023-03-01 DIAGNOSIS — D509 Iron deficiency anemia, unspecified: Secondary | ICD-10-CM | POA: Diagnosis not present

## 2023-03-04 ENCOUNTER — Telehealth (HOSPITAL_COMMUNITY): Payer: Self-pay | Admitting: *Deleted

## 2023-03-04 ENCOUNTER — Telehealth: Payer: Self-pay | Admitting: Primary Care

## 2023-03-04 DIAGNOSIS — N2581 Secondary hyperparathyroidism of renal origin: Secondary | ICD-10-CM | POA: Diagnosis not present

## 2023-03-04 DIAGNOSIS — D631 Anemia in chronic kidney disease: Secondary | ICD-10-CM | POA: Diagnosis not present

## 2023-03-04 DIAGNOSIS — D509 Iron deficiency anemia, unspecified: Secondary | ICD-10-CM | POA: Diagnosis not present

## 2023-03-04 DIAGNOSIS — N186 End stage renal disease: Secondary | ICD-10-CM | POA: Diagnosis not present

## 2023-03-04 DIAGNOSIS — Z992 Dependence on renal dialysis: Secondary | ICD-10-CM | POA: Diagnosis not present

## 2023-03-04 NOTE — Telephone Encounter (Signed)
 AVS States:  Please follow up after you have completed the pulmonary function test, split night sleep study, swallowing evaluation, and chest x-ray. Use the nebulizer 2-3 times daily for breakthrough shortness of breath or wheezing, followed by the flutter valve.  Order is in but PT's wife (DPR)  calling on Swallow study. When will that be sched? Her [phone]

## 2023-03-04 NOTE — Telephone Encounter (Signed)
 Reaching out to patient to offer assistance regarding upcoming cardiac imaging study; wife (ok per DPR) verbalizes understanding of appt date/time, parking situation and where to check in, pre-test NPO status and medications ordered, and verified current allergies; name and call back number provided for further questions should they arise Kerri Peed RN Navigator Cardiac Imaging [facility] [phone] office [phone] cell  Aware to avoid caffeine and imdur  prior to test.

## 2023-03-04 NOTE — Telephone Encounter (Signed)
 The swallow test was not ordered by our practice it was ordered by Deforest Fast, MD so they would have to be the one to call and set up the test

## 2023-03-04 NOTE — Telephone Encounter (Signed)
 Notified patient's wife Dr. Gailen Journey ordered the swallow test.

## 2023-03-05 ENCOUNTER — Encounter (HOSPITAL_COMMUNITY): Admission: RE | Admit: 2023-03-05 | Payer: Medicare Other | Source: Ambulatory Visit

## 2023-03-05 ENCOUNTER — Ambulatory Visit (HOSPITAL_COMMUNITY)
Admission: RE | Admit: 2023-03-05 | Discharge: 2023-03-05 | Disposition: A | Discharge status: Home / Self Care | Payer: Medicare Other | Source: Ambulatory Visit | Attending: Internal Medicine | Admitting: Internal Medicine

## 2023-03-05 DIAGNOSIS — I1A0 Resistant hypertension: Secondary | ICD-10-CM | POA: Diagnosis not present

## 2023-03-05 DIAGNOSIS — R5383 Other fatigue: Secondary | ICD-10-CM

## 2023-03-05 DIAGNOSIS — R0600 Dyspnea, unspecified: Secondary | ICD-10-CM

## 2023-03-05 DIAGNOSIS — R079 Chest pain, unspecified: Secondary | ICD-10-CM | POA: Diagnosis not present

## 2023-03-05 LAB — NM PET CT CARDIAC PERFUSION MULTI W/ABSOLUTE BLOODFLOW
MBFR: 1.67
Nuc Rest EF: 35 %
Nuc Stress EF: 37 %
Rest MBF: 0.93 ml/g/min
Rest Nuclear Isotope Dose: 24.2 mCi
ST Depression (mm): 0 mm
Stress MBF: 1.55 ml/g/min
Stress Nuclear Isotope Dose: 24.1 mCi

## 2023-03-05 MED ORDER — REGADENOSON 0.4 MG/5ML IV SOLN
INTRAVENOUS | Status: AC
  Filled 2023-03-05: qty 5

## 2023-03-05 MED ORDER — REGADENOSON 0.4 MG/5ML IV SOLN: 0.4000 mg | Freq: Once | INTRAVENOUS | Status: DC

## 2023-03-05 MED ORDER — RUBIDIUM RB82 GENERATOR (RUBYFILL)
25.0000 | PACK | Freq: Once | INTRAVENOUS | Status: AC
  Administered 2023-03-05: 24.05 via INTRAVENOUS

## 2023-03-05 MED ORDER — RUBIDIUM RB82 GENERATOR (RUBYFILL)
25.0000 | PACK | Freq: Once | INTRAVENOUS | Status: AC
  Administered 2023-03-05: 24.18 via INTRAVENOUS

## 2023-03-05 NOTE — Progress Notes (Signed)
Unable to take BP due to fistula in left arm

## 2023-03-06 ENCOUNTER — Other Ambulatory Visit (HOSPITAL_COMMUNITY): Payer: Medicare Other

## 2023-03-06 ENCOUNTER — Other Ambulatory Visit: Payer: Self-pay | Admitting: Family

## 2023-03-06 DIAGNOSIS — D509 Iron deficiency anemia, unspecified: Secondary | ICD-10-CM | POA: Diagnosis not present

## 2023-03-06 DIAGNOSIS — Z992 Dependence on renal dialysis: Secondary | ICD-10-CM | POA: Diagnosis not present

## 2023-03-06 DIAGNOSIS — D631 Anemia in chronic kidney disease: Secondary | ICD-10-CM | POA: Diagnosis not present

## 2023-03-06 DIAGNOSIS — I10 Essential (primary) hypertension: Secondary | ICD-10-CM

## 2023-03-06 DIAGNOSIS — N2581 Secondary hyperparathyroidism of renal origin: Secondary | ICD-10-CM | POA: Diagnosis not present

## 2023-03-06 DIAGNOSIS — N186 End stage renal disease: Secondary | ICD-10-CM | POA: Diagnosis not present

## 2023-03-08 DIAGNOSIS — D509 Iron deficiency anemia, unspecified: Secondary | ICD-10-CM | POA: Diagnosis not present

## 2023-03-08 DIAGNOSIS — N2581 Secondary hyperparathyroidism of renal origin: Secondary | ICD-10-CM | POA: Diagnosis not present

## 2023-03-08 DIAGNOSIS — Z992 Dependence on renal dialysis: Secondary | ICD-10-CM | POA: Diagnosis not present

## 2023-03-08 DIAGNOSIS — D631 Anemia in chronic kidney disease: Secondary | ICD-10-CM | POA: Diagnosis not present

## 2023-03-08 DIAGNOSIS — N186 End stage renal disease: Secondary | ICD-10-CM | POA: Diagnosis not present

## 2023-03-11 DIAGNOSIS — N186 End stage renal disease: Secondary | ICD-10-CM | POA: Diagnosis not present

## 2023-03-11 DIAGNOSIS — D509 Iron deficiency anemia, unspecified: Secondary | ICD-10-CM | POA: Diagnosis not present

## 2023-03-11 DIAGNOSIS — D631 Anemia in chronic kidney disease: Secondary | ICD-10-CM | POA: Diagnosis not present

## 2023-03-11 DIAGNOSIS — Z992 Dependence on renal dialysis: Secondary | ICD-10-CM | POA: Diagnosis not present

## 2023-03-11 DIAGNOSIS — N2581 Secondary hyperparathyroidism of renal origin: Secondary | ICD-10-CM | POA: Diagnosis not present

## 2023-03-12 ENCOUNTER — Ambulatory Visit: Payer: Self-pay | Admitting: Licensed Clinical Social Worker

## 2023-03-12 NOTE — Patient Instructions (Signed)
Visit Information  Thank you for taking time to visit with me today. Please don't hesitate to contact me if I can be of assistance to you.   Following are the goals we discussed today:   Goals Addressed             This Visit's Progress    COMPLETED: Care Coordination Activities       Care Coordination Interventions: Patient stated that he and his wife just go to one food pantry, Sw also spoke with the patients wife Chyrl Civatte) and the SW will mail out resources for other food pantries. SW also educated the family on the [employer] App.  SW completed the SDOH questionnaire and no other concerns.  Sw will follow up 02/08/2023 at 11:15 am        No further follow up needed and the Sw discussed if any other SDOH need arise to have the PCP make a referral to have the SW schedule for appointment.   Please call the care guide team at [phone] if you need to cancel or reschedule your appointment.   If you are experiencing a Mental Health or Behavioral Health Crisis or need someone to talk to, please call the Suicide and Crisis Lifeline: 988 call 1-800-273-TALK (toll free, 24 hour hotline) call 911  Patient verbalizes understanding of instructions and care plan provided today and agrees to view in MyChart. Active MyChart status and patient understanding of how to access instructions and care plan via MyChart confirmed with patient.     Jeanie Cooks, PhD [facility], [facility] Social Worker Direct Dial: [phone]  Fax: [phone]

## 2023-03-12 NOTE — Patient Outreach (Signed)
  Care Coordination   Follow Up Visit Note   03/12/2023 Name: Kirk [name] MRN: [id number] DOB: August 29, 1960  Kirk [name] is a 63 y.o. year old male who sees Ngetich, Dinah C, NP for primary care. I spoke with  Kirk [name] and his wife Kirk [name] by phone today.  What matters to the patients health and wellness today?  Food insecurities     Goals Addressed             This Visit's Progress    COMPLETED: Care Coordination Activities       Care Coordination Interventions: Patient stated that he and his wife just go to one food pantry, Sw also spoke with the patients wife Kirk [name]) and the SW will mail out resources for other food pantries. SW also educated the family on the [employer] App.  SW completed the SDOH questionnaire and no other concerns.  Sw will follow up 02/08/2023 at 11:15 am        SDOH assessments and interventions completed:  Yes  SDOH Interventions Today    Flowsheet Row Most Recent Value  SDOH Interventions   Food Insecurity Interventions Intervention Not Indicated  Housing Interventions Intervention Not Indicated  Transportation Interventions Intervention Not Indicated  Utilities Interventions Intervention Not Indicated        Care Coordination Interventions:  Yes, provided  Interventions Today    Flowsheet Row Most Recent Value  General Interventions   General Interventions Discussed/Reviewed Community Resources, General Interventions Reviewed  [Patient received the food insecurity resources in the mail and no furtehr follow up needed.]        Follow up plan: No further intervention required.   Encounter Outcome:  Patient Visit Completed   Jeanie Cooks, PhD [facility], [facility] Social Worker Direct Dial: [phone]  Fax: [phone]

## 2023-03-13 DIAGNOSIS — D509 Iron deficiency anemia, unspecified: Secondary | ICD-10-CM | POA: Diagnosis not present

## 2023-03-13 DIAGNOSIS — N186 End stage renal disease: Secondary | ICD-10-CM | POA: Diagnosis not present

## 2023-03-13 DIAGNOSIS — Z992 Dependence on renal dialysis: Secondary | ICD-10-CM | POA: Diagnosis not present

## 2023-03-13 DIAGNOSIS — N2581 Secondary hyperparathyroidism of renal origin: Secondary | ICD-10-CM | POA: Diagnosis not present

## 2023-03-13 DIAGNOSIS — D631 Anemia in chronic kidney disease: Secondary | ICD-10-CM | POA: Diagnosis not present

## 2023-03-15 DIAGNOSIS — D509 Iron deficiency anemia, unspecified: Secondary | ICD-10-CM | POA: Diagnosis not present

## 2023-03-15 DIAGNOSIS — Z992 Dependence on renal dialysis: Secondary | ICD-10-CM | POA: Diagnosis not present

## 2023-03-15 DIAGNOSIS — D631 Anemia in chronic kidney disease: Secondary | ICD-10-CM | POA: Diagnosis not present

## 2023-03-15 DIAGNOSIS — N2581 Secondary hyperparathyroidism of renal origin: Secondary | ICD-10-CM | POA: Diagnosis not present

## 2023-03-15 DIAGNOSIS — N186 End stage renal disease: Secondary | ICD-10-CM | POA: Diagnosis not present

## 2023-03-16 ENCOUNTER — Emergency Department (HOSPITAL_COMMUNITY): Payer: Medicare Other

## 2023-03-16 ENCOUNTER — Inpatient Hospital Stay (HOSPITAL_COMMUNITY): Payer: Medicare Other

## 2023-03-16 ENCOUNTER — Other Ambulatory Visit: Payer: Self-pay

## 2023-03-16 ENCOUNTER — Inpatient Hospital Stay (HOSPITAL_COMMUNITY)
Admission: EM | Admit: 2023-03-16 | Discharge: 2023-03-23 | DRG: 193 | Disposition: A | Discharge status: Home Health | Payer: Medicare Other | Attending: Family Medicine | Admitting: Family Medicine

## 2023-03-16 ENCOUNTER — Encounter (HOSPITAL_COMMUNITY): Payer: Self-pay | Admitting: *Deleted

## 2023-03-16 DIAGNOSIS — R079 Chest pain, unspecified: Secondary | ICD-10-CM | POA: Diagnosis not present

## 2023-03-16 DIAGNOSIS — D709 Neutropenia, unspecified: Secondary | ICD-10-CM | POA: Diagnosis not present

## 2023-03-16 DIAGNOSIS — R0602 Shortness of breath: Secondary | ICD-10-CM | POA: Diagnosis not present

## 2023-03-16 DIAGNOSIS — J101 Influenza due to other identified influenza virus with other respiratory manifestations: Secondary | ICD-10-CM | POA: Diagnosis not present

## 2023-03-16 DIAGNOSIS — J449 Chronic obstructive pulmonary disease, unspecified: Secondary | ICD-10-CM | POA: Diagnosis not present

## 2023-03-16 DIAGNOSIS — J90 Pleural effusion, not elsewhere classified: Secondary | ICD-10-CM | POA: Diagnosis not present

## 2023-03-16 DIAGNOSIS — T8612 Kidney transplant failure: Secondary | ICD-10-CM | POA: Diagnosis not present

## 2023-03-16 DIAGNOSIS — Y830 Surgical operation with transplant of whole organ as the cause of abnormal reaction of the patient, or of later complication, without mention of misadventure at the time of the procedure: Secondary | ICD-10-CM | POA: Diagnosis present

## 2023-03-16 DIAGNOSIS — J4531 Mild persistent asthma with (acute) exacerbation: Secondary | ICD-10-CM | POA: Diagnosis present

## 2023-03-16 DIAGNOSIS — I252 Old myocardial infarction: Secondary | ICD-10-CM

## 2023-03-16 DIAGNOSIS — J09X1 Influenza due to identified novel influenza A virus with pneumonia: Secondary | ICD-10-CM | POA: Diagnosis present

## 2023-03-16 DIAGNOSIS — D631 Anemia in chronic kidney disease: Secondary | ICD-10-CM | POA: Diagnosis present

## 2023-03-16 DIAGNOSIS — N2581 Secondary hyperparathyroidism of renal origin: Secondary | ICD-10-CM | POA: Diagnosis present

## 2023-03-16 DIAGNOSIS — D701 Agranulocytosis secondary to cancer chemotherapy: Secondary | ICD-10-CM | POA: Diagnosis not present

## 2023-03-16 DIAGNOSIS — I2510 Atherosclerotic heart disease of native coronary artery without angina pectoris: Secondary | ICD-10-CM | POA: Diagnosis present

## 2023-03-16 DIAGNOSIS — I5023 Acute on chronic systolic (congestive) heart failure: Secondary | ICD-10-CM | POA: Insufficient documentation

## 2023-03-16 DIAGNOSIS — Z992 Dependence on renal dialysis: Secondary | ICD-10-CM

## 2023-03-16 DIAGNOSIS — I2489 Other forms of acute ischemic heart disease: Secondary | ICD-10-CM | POA: Diagnosis not present

## 2023-03-16 DIAGNOSIS — R1319 Other dysphagia: Secondary | ICD-10-CM | POA: Diagnosis not present

## 2023-03-16 DIAGNOSIS — Z8701 Personal history of pneumonia (recurrent): Secondary | ICD-10-CM

## 2023-03-16 DIAGNOSIS — J9601 Acute respiratory failure with hypoxia: Secondary | ICD-10-CM | POA: Diagnosis not present

## 2023-03-16 DIAGNOSIS — G4733 Obstructive sleep apnea (adult) (pediatric): Secondary | ICD-10-CM | POA: Diagnosis present

## 2023-03-16 DIAGNOSIS — Z7982 Long term (current) use of aspirin: Secondary | ICD-10-CM

## 2023-03-16 DIAGNOSIS — I501 Left ventricular failure: Secondary | ICD-10-CM | POA: Diagnosis not present

## 2023-03-16 DIAGNOSIS — E785 Hyperlipidemia, unspecified: Secondary | ICD-10-CM | POA: Diagnosis present

## 2023-03-16 DIAGNOSIS — K219 Gastro-esophageal reflux disease without esophagitis: Secondary | ICD-10-CM | POA: Diagnosis present

## 2023-03-16 DIAGNOSIS — I5033 Acute on chronic diastolic (congestive) heart failure: Secondary | ICD-10-CM | POA: Diagnosis present

## 2023-03-16 DIAGNOSIS — F32A Depression, unspecified: Secondary | ICD-10-CM | POA: Diagnosis present

## 2023-03-16 DIAGNOSIS — I951 Orthostatic hypotension: Secondary | ICD-10-CM | POA: Diagnosis not present

## 2023-03-16 DIAGNOSIS — R195 Other fecal abnormalities: Secondary | ICD-10-CM

## 2023-03-16 DIAGNOSIS — G4700 Insomnia, unspecified: Secondary | ICD-10-CM | POA: Diagnosis not present

## 2023-03-16 DIAGNOSIS — I700 Atherosclerosis of aorta: Secondary | ICD-10-CM | POA: Diagnosis not present

## 2023-03-16 DIAGNOSIS — Z885 Allergy status to narcotic agent status: Secondary | ICD-10-CM

## 2023-03-16 DIAGNOSIS — I132 Hypertensive heart and chronic kidney disease with heart failure and with stage 5 chronic kidney disease, or end stage renal disease: Secondary | ICD-10-CM | POA: Diagnosis present

## 2023-03-16 DIAGNOSIS — N186 End stage renal disease: Secondary | ICD-10-CM | POA: Diagnosis present

## 2023-03-16 DIAGNOSIS — I5030 Unspecified diastolic (congestive) heart failure: Secondary | ICD-10-CM | POA: Insufficient documentation

## 2023-03-16 DIAGNOSIS — Z1152 Encounter for screening for COVID-19: Secondary | ICD-10-CM | POA: Diagnosis not present

## 2023-03-16 DIAGNOSIS — M109 Gout, unspecified: Secondary | ICD-10-CM | POA: Diagnosis present

## 2023-03-16 DIAGNOSIS — R0989 Other specified symptoms and signs involving the circulatory and respiratory systems: Secondary | ICD-10-CM | POA: Diagnosis not present

## 2023-03-16 DIAGNOSIS — E8779 Other fluid overload: Secondary | ICD-10-CM | POA: Diagnosis not present

## 2023-03-16 DIAGNOSIS — D849 Immunodeficiency, unspecified: Secondary | ICD-10-CM | POA: Diagnosis not present

## 2023-03-16 DIAGNOSIS — C7951 Secondary malignant neoplasm of bone: Secondary | ICD-10-CM | POA: Diagnosis present

## 2023-03-16 DIAGNOSIS — C50919 Malignant neoplasm of unspecified site of unspecified female breast: Secondary | ICD-10-CM | POA: Diagnosis present

## 2023-03-16 DIAGNOSIS — R1310 Dysphagia, unspecified: Secondary | ICD-10-CM

## 2023-03-16 DIAGNOSIS — Z79899 Other long term (current) drug therapy: Secondary | ICD-10-CM

## 2023-03-16 DIAGNOSIS — J810 Acute pulmonary edema: Secondary | ICD-10-CM | POA: Diagnosis not present

## 2023-03-16 DIAGNOSIS — Q8781 Alport syndrome: Secondary | ICD-10-CM | POA: Diagnosis not present

## 2023-03-16 DIAGNOSIS — I5042 Chronic combined systolic (congestive) and diastolic (congestive) heart failure: Secondary | ICD-10-CM | POA: Diagnosis present

## 2023-03-16 DIAGNOSIS — C50929 Malignant neoplasm of unspecified site of unspecified male breast: Secondary | ICD-10-CM | POA: Diagnosis present

## 2023-03-16 DIAGNOSIS — I5020 Unspecified systolic (congestive) heart failure: Secondary | ICD-10-CM | POA: Diagnosis present

## 2023-03-16 DIAGNOSIS — J440 Chronic obstructive pulmonary disease with acute lower respiratory infection: Secondary | ICD-10-CM | POA: Diagnosis not present

## 2023-03-16 DIAGNOSIS — R0902 Hypoxemia: Principal | ICD-10-CM

## 2023-03-16 DIAGNOSIS — Z87891 Personal history of nicotine dependence: Secondary | ICD-10-CM

## 2023-03-16 DIAGNOSIS — E1122 Type 2 diabetes mellitus with diabetic chronic kidney disease: Secondary | ICD-10-CM | POA: Diagnosis present

## 2023-03-16 DIAGNOSIS — D61818 Other pancytopenia: Secondary | ICD-10-CM | POA: Diagnosis present

## 2023-03-16 DIAGNOSIS — Z7951 Long term (current) use of inhaled steroids: Secondary | ICD-10-CM

## 2023-03-16 DIAGNOSIS — J1000 Influenza due to other identified influenza virus with unspecified type of pneumonia: Secondary | ICD-10-CM | POA: Diagnosis not present

## 2023-03-16 DIAGNOSIS — Z905 Acquired absence of kidney: Secondary | ICD-10-CM

## 2023-03-16 DIAGNOSIS — R0609 Other forms of dyspnea: Secondary | ICD-10-CM | POA: Diagnosis not present

## 2023-03-16 DIAGNOSIS — D638 Anemia in other chronic diseases classified elsewhere: Secondary | ICD-10-CM | POA: Diagnosis present

## 2023-03-16 DIAGNOSIS — R519 Headache, unspecified: Secondary | ICD-10-CM | POA: Diagnosis not present

## 2023-03-16 DIAGNOSIS — R590 Localized enlarged lymph nodes: Secondary | ICD-10-CM | POA: Diagnosis not present

## 2023-03-16 DIAGNOSIS — R918 Other nonspecific abnormal finding of lung field: Secondary | ICD-10-CM | POA: Diagnosis not present

## 2023-03-16 DIAGNOSIS — I517 Cardiomegaly: Secondary | ICD-10-CM | POA: Diagnosis not present

## 2023-03-16 DIAGNOSIS — R5081 Fever presenting with conditions classified elsewhere: Secondary | ICD-10-CM | POA: Diagnosis present

## 2023-03-16 DIAGNOSIS — G8929 Other chronic pain: Secondary | ICD-10-CM | POA: Diagnosis present

## 2023-03-16 DIAGNOSIS — N400 Enlarged prostate without lower urinary tract symptoms: Secondary | ICD-10-CM | POA: Diagnosis present

## 2023-03-16 DIAGNOSIS — Z981 Arthrodesis status: Secondary | ICD-10-CM | POA: Diagnosis not present

## 2023-03-16 DIAGNOSIS — I120 Hypertensive chronic kidney disease with stage 5 chronic kidney disease or end stage renal disease: Secondary | ICD-10-CM | POA: Diagnosis not present

## 2023-03-16 LAB — CBC
HCT: 26.8 % — ABNORMAL LOW (ref 39.0–52.0)
HCT: 26.4 % — ABNORMAL LOW (ref 39.0–52.0)
Hemoglobin: 8.6 g/dL — ABNORMAL LOW (ref 13.0–17.0)
Hemoglobin: 8.6 g/dL — ABNORMAL LOW (ref 13.0–17.0)
MCH: 31.6 pg (ref 26.0–34.0)
MCH: 32.1 pg (ref 26.0–34.0)
MCHC: 32.1 g/dL (ref 30.0–36.0)
MCHC: 32.6 g/dL (ref 30.0–36.0)
MCV: 98.5 fL (ref 80.0–100.0)
MCV: 98.5 fL (ref 80.0–100.0)
Platelets: 56 10*3/uL — ABNORMAL LOW (ref 150–400)
Platelets: 54 10*3/uL — ABNORMAL LOW (ref 150–400)
RBC: 2.72 MIL/uL — ABNORMAL LOW (ref 4.22–5.81)
RBC: 2.68 MIL/uL — ABNORMAL LOW (ref 4.22–5.81)
RDW: 15.1 % (ref 11.5–15.5)
RDW: 15.1 % (ref 11.5–15.5)
WBC: 3.2 10*3/uL — ABNORMAL LOW (ref 4.0–10.5)
WBC: 3.4 10*3/uL — ABNORMAL LOW (ref 4.0–10.5)
nRBC: 0 % (ref 0.0–0.2)
nRBC: 0.9 % — ABNORMAL HIGH (ref 0.0–0.2)

## 2023-03-16 LAB — DIFFERENTIAL
Abs Immature Granulocytes: 0.02 10*3/uL (ref 0.00–0.07)
Basophils Absolute: 0 10*3/uL (ref 0.0–0.1)
Basophils Relative: 1 %
Eosinophils Absolute: 0 10*3/uL (ref 0.0–0.5)
Eosinophils Relative: 1 %
Immature Granulocytes: 1 %
Lymphocytes Relative: 5 %
Lymphs Abs: 0.2 10*3/uL — ABNORMAL LOW (ref 0.7–4.0)
Monocytes Absolute: 0.3 10*3/uL (ref 0.1–1.0)
Monocytes Relative: 8 %
Neutro Abs: 2.9 10*3/uL (ref 1.7–7.7)
Neutrophils Relative %: 84 %

## 2023-03-16 LAB — BRAIN NATRIURETIC PEPTIDE: B Natriuretic Peptide: 1610.6 pg/mL — ABNORMAL HIGH (ref 0.0–100.0)

## 2023-03-16 LAB — BASIC METABOLIC PANEL
Anion gap: 13 (ref 5–15)
BUN: 26 mg/dL — ABNORMAL HIGH (ref 8–23)
CO2: 31 mmol/L (ref 22–32)
Calcium: 9.6 mg/dL (ref 8.9–10.3)
Chloride: 94 mmol/L — ABNORMAL LOW (ref 98–111)
Creatinine, Ser: 7.69 mg/dL — ABNORMAL HIGH (ref 0.61–1.24)
GFR, Estimated: 7 mL/min — ABNORMAL LOW (ref >60)
Glucose, Bld: 119 mg/dL — ABNORMAL HIGH (ref 70–99)
Potassium: 3.6 mmol/L (ref 3.5–5.1)
Sodium: 138 mmol/L (ref 135–145)

## 2023-03-16 LAB — RESP PANEL BY RT-PCR (RSV, FLU A&B, COVID)  RVPGX2
Influenza A by PCR: POSITIVE — AB
Influenza B by PCR: NEGATIVE
Resp Syncytial Virus by PCR: NEGATIVE
SARS Coronavirus 2 by RT PCR: NEGATIVE

## 2023-03-16 LAB — MRSA NEXT GEN BY PCR, NASAL: MRSA by PCR Next Gen: NOT DETECTED

## 2023-03-16 LAB — TROPONIN I (HIGH SENSITIVITY)
Troponin I (High Sensitivity): 23 ng/L — ABNORMAL HIGH (ref <18)
Troponin I (High Sensitivity): 23 ng/L — ABNORMAL HIGH (ref <18)

## 2023-03-16 MED ORDER — RENA-VITE PO TABS
1.0000 | ORAL_TABLET | Freq: Every day | ORAL | Status: DC
Start: 2023-03-16 — End: 2023-03-23
  Administered 2023-03-16 – 2023-03-22 (×6): 1 via ORAL
  Filled 2023-03-16 (×9): qty 1

## 2023-03-16 MED ORDER — ISOSORBIDE MONONITRATE ER 60 MG PO TB24
60.0000 mg | ORAL_TABLET | Freq: Every day | ORAL | Status: DC
  Administered 2023-03-17 – 2023-03-23 (×6): 60 mg via ORAL
  Filled 2023-03-16 (×7): qty 1
  Filled 2023-03-16: qty 2

## 2023-03-16 MED ORDER — SUCROFERRIC OXYHYDROXIDE 500 MG PO CHEW
1000.0000 mg | CHEWABLE_TABLET | Freq: Three times a day (TID) | ORAL | Status: DC
  Administered 2023-03-17 (×2): 1000 mg via ORAL
  Filled 2023-03-16 (×6): qty 2

## 2023-03-16 MED ORDER — HEPARIN SODIUM (PORCINE) 1000 UNIT/ML IJ SOLN
INTRAMUSCULAR | Status: AC
  Filled 2023-03-16: qty 4

## 2023-03-16 MED ORDER — SODIUM CHLORIDE 0.9 % IV SOLN
1.0000 g | INTRAVENOUS | Status: DC
  Filled 2023-03-16: qty 10

## 2023-03-16 MED ORDER — ACETAMINOPHEN 325 MG PO TABS
650.0000 mg | ORAL_TABLET | Freq: Four times a day (QID) | ORAL | Status: DC | PRN
  Administered 2023-03-16 – 2023-03-22 (×3): 650 mg via ORAL
  Filled 2023-03-16 (×3): qty 2

## 2023-03-16 MED ORDER — IPRATROPIUM-ALBUTEROL 0.5-2.5 (3) MG/3ML IN SOLN
3.0000 mL | Freq: Once | RESPIRATORY_TRACT | Status: AC
  Administered 2023-03-16: 3 mL via RESPIRATORY_TRACT
  Filled 2023-03-16: qty 3

## 2023-03-16 MED ORDER — SODIUM CHLORIDE 0.9 % IV SOLN
2.0000 g | Freq: Once | INTRAVENOUS | Status: AC
  Administered 2023-03-16: 2 g via INTRAVENOUS
  Filled 2023-03-16: qty 12.5

## 2023-03-16 MED ORDER — PRAVASTATIN SODIUM 40 MG PO TABS
40.0000 mg | ORAL_TABLET | Freq: Every day | ORAL | Status: DC
  Administered 2023-03-16 – 2023-03-23 (×7): 40 mg via ORAL
  Filled 2023-03-16 (×8): qty 1

## 2023-03-16 MED ORDER — ACETAMINOPHEN 500 MG PO TABS
1000.0000 mg | ORAL_TABLET | Freq: Once | ORAL | Status: AC
  Administered 2023-03-16: 1000 mg via ORAL
  Filled 2023-03-16: qty 2

## 2023-03-16 MED ORDER — AMLODIPINE BESYLATE 10 MG PO TABS
10.0000 mg | ORAL_TABLET | Freq: Every day | ORAL | Status: DC
  Administered 2023-03-16 – 2023-03-23 (×7): 10 mg via ORAL
  Filled 2023-03-16: qty 2
  Filled 2023-03-16 (×6): qty 1

## 2023-03-16 MED ORDER — GUAIFENESIN 100 MG/5ML PO LIQD
5.0000 mL | ORAL | Status: DC | PRN
  Administered 2023-03-17 (×2): 5 mL via ORAL
  Filled 2023-03-16 (×2): qty 10

## 2023-03-16 MED ORDER — OSELTAMIVIR PHOSPHATE 30 MG PO CAPS
30.0000 mg | ORAL_CAPSULE | ORAL | Status: AC
Start: 2023-03-16 — End: 2023-03-20
  Administered 2023-03-16: 30 mg via ORAL
  Filled 2023-03-16 (×2): qty 1

## 2023-03-16 MED ORDER — DOXAZOSIN MESYLATE 8 MG PO TABS
8.0000 mg | ORAL_TABLET | Freq: Every day | ORAL | Status: DC
  Administered 2023-03-17 – 2023-03-21 (×5): 8 mg via ORAL
  Filled 2023-03-16 (×6): qty 1

## 2023-03-16 MED ORDER — ASPIRIN 81 MG PO CHEW
81.0000 mg | CHEWABLE_TABLET | Freq: Every day | ORAL | Status: DC
  Administered 2023-03-16 – 2023-03-23 (×7): 81 mg via ORAL
  Filled 2023-03-16 (×8): qty 1

## 2023-03-16 MED ORDER — CHLORHEXIDINE GLUCONATE CLOTH 2 % EX PADS
6.0000 | MEDICATED_PAD | Freq: Every day | CUTANEOUS | Status: DC
  Administered 2023-03-17 – 2023-03-20 (×4): 6 via TOPICAL

## 2023-03-16 MED ORDER — SODIUM CHLORIDE 0.9 % IV SOLN
500.0000 mg | INTRAVENOUS | Status: DC
  Administered 2023-03-16: 500 mg via INTRAVENOUS
  Filled 2023-03-16: qty 5

## 2023-03-16 MED ORDER — HYDRALAZINE HCL 50 MG PO TABS
100.0000 mg | ORAL_TABLET | Freq: Three times a day (TID) | ORAL | Status: DC
  Administered 2023-03-17 – 2023-03-20 (×7): 100 mg via ORAL
  Filled 2023-03-16 (×8): qty 2

## 2023-03-16 MED ORDER — HEPARIN SODIUM (PORCINE) 5000 UNIT/ML IJ SOLN
5000.0000 [IU] | Freq: Three times a day (TID) | INTRAMUSCULAR | Status: DC
Start: 2023-03-16 — End: 2023-03-23
  Administered 2023-03-17 – 2023-03-23 (×13): 5000 [IU] via SUBCUTANEOUS
  Filled 2023-03-16 (×18): qty 1

## 2023-03-16 MED ORDER — DULOXETINE HCL 60 MG PO CPEP
60.0000 mg | ORAL_CAPSULE | Freq: Every day | ORAL | Status: DC
  Administered 2023-03-16 – 2023-03-23 (×7): 60 mg via ORAL
  Filled 2023-03-16 (×7): qty 1

## 2023-03-16 MED ORDER — VANCOMYCIN HCL 2000 MG/400ML IV SOLN
2000.0000 mg | Freq: Once | INTRAVENOUS | Status: DC
  Filled 2023-03-16: qty 400

## 2023-03-16 MED ORDER — IOHEXOL 350 MG/ML SOLN
75.0000 mL | Freq: Once | INTRAVENOUS | Status: AC | PRN
  Administered 2023-03-16: 75 mL via INTRAVENOUS

## 2023-03-16 MED ORDER — IPRATROPIUM-ALBUTEROL 0.5-2.5 (3) MG/3ML IN SOLN
3.0000 mL | RESPIRATORY_TRACT | Status: DC
  Administered 2023-03-16 – 2023-03-17 (×2): 3 mL via RESPIRATORY_TRACT
  Filled 2023-03-16 (×2): qty 3

## 2023-03-16 MED ORDER — VANCOMYCIN HCL IN DEXTROSE 1-5 GM/200ML-% IV SOLN: 1000.0000 mg | INTRAVENOUS | Status: DC

## 2023-03-16 MED ORDER — METOPROLOL TARTRATE 25 MG PO TABS
100.0000 mg | ORAL_TABLET | Freq: Two times a day (BID) | ORAL | Status: DC
Start: 2023-03-16 — End: 2023-03-20
  Administered 2023-03-17 – 2023-03-20 (×6): 100 mg via ORAL
  Filled 2023-03-16 (×6): qty 1

## 2023-03-16 MED ORDER — GUAIFENESIN ER 600 MG PO TB12
600.0000 mg | ORAL_TABLET | Freq: Two times a day (BID) | ORAL | Status: DC
  Administered 2023-03-17 – 2023-03-23 (×12): 600 mg via ORAL
  Filled 2023-03-16 (×13): qty 1

## 2023-03-16 MED ORDER — SEVELAMER CARBONATE 800 MG PO TABS
3200.0000 mg | ORAL_TABLET | Freq: Three times a day (TID) | ORAL | Status: DC
  Filled 2023-03-16 (×2): qty 4

## 2023-03-16 NOTE — Progress Notes (Signed)
 Pharmacy Antibiotic Note  Kirk [name] is a 63 y.o. male admitted on 03/16/2023 with pna.  Pharmacy has been consulted for vancomycin and cefepime dosing.  ESRD-HD usually MWF  Plan: Vancomycin 2g IV x 1, then 1g IV qHD Add MRSA PCR Cefepime 2g IV x 1, then 1g IV q 24h Monitor HD schedule, Cx/PCR to narrow Vancomycin random level as needed   Height: 5\' 7"  (170.2 cm) Weight: 88 kg (194 lb 0.1 oz) IBW/kg (Calculated) : 66.1  Temp (24hrs), Avg:100.1 F (37.8 C), Min:98.3 F (36.8 C), Max:101 F (38.3 C)  Recent Labs  Lab 03/16/23 1152  WBC 3.2*  CREATININE 7.69*    Estimated Creatinine Clearance: 10.7 mL/min (A) (by C-G formula based on SCr of 7.69 mg/dL (H)).    Allergies  Allergen Reactions   Codeine Nausea And Vomiting   Hydrocodone-Acetaminophen Nausea And Vomiting    Daylene Posey, PharmD, [facility] Clinical Pharmacist ED Pharmacist Phone # [phone] 03/16/2023 6:08 PM

## 2023-03-16 NOTE — Assessment & Plan Note (Signed)
 MWF, has not missed a session.  Does not appear grossly hypervolemic but will defer to nephrology given patient does not make urine. 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 -Nephrology consulted, appreciate care -Continue home Phos binders

## 2023-03-16 NOTE — ED Provider Notes (Signed)
 [place] [facility] AT [facility] Provider Note   CSN: [id number] Arrival date & time: 03/16/23  1133     History  Chief Complaint  Patient presents with   Shortness of Breath    Kirk [name] is a 63 y.o. male.   Shortness of Breath 63 year old male history of ESRD on dialysis, asthma, GERD, hypertension presenting for shortness of breath.  Patient has felt bad since Tuesday.  He had cough, shortness of breath, headache generalized.  No chest pain.  No fevers, decreased p.o. intake but no vomiting or diarrhea.  Went to dialysis yesterday and it took about a liter off.  However he is still short of breath and feels like he has too much fluid on him.  No leg swelling.  He does not make urine anymore.  He has been wheezing at home.     Home Medications Prior to Admission medications   Medication Sig Start Date End Date Taking? Authorizing Provider  acetaminophen (TYLENOL) 500 MG tablet Take 1 tablet (500 mg total) by mouth every 6 (six) hours as needed for moderate pain. 09/12/14  Yes Standley Brooking, MD  albuterol (PROVENTIL) (2.5 MG/3ML) 0.083% nebulizer solution Take 3 mLs (2.5 mg total) by nebulization every 6 (six) hours as needed for wheezing or shortness of breath. 11/13/22  Yes Glenford Bayley, NP  albuterol (VENTOLIN HFA) 108 (90 Base) MCG/ACT inhaler Inhale 2 puffs into the lungs every 6 (six) hours as needed for wheezing or shortness of breath. 10/29/22  Yes Nyoka Cowden, MD  amLODipine (NORVASC) 10 MG tablet TAKE 1 TABLET BY MOUTH EVERY DAY 12/05/22  Yes Ngetich, Dinah C, NP  aspirin 81 MG chewable tablet Chew 81 mg by mouth daily. 09/28/20  Yes [provider]  Cholecalciferol (VITAMIN D3) 25 MCG (1000 UT) CAPS Take 4,000 Units by mouth daily. Take at home on Tues, Thurs, Sat, and Sun   Yes [provider]  Dextromethorphan Polistirex (ROBITUSSIN 12 HOUR COUGH PO) Take 1 Dose by mouth 2 (two) times daily as needed (cough).    Yes [provider]  diphenhydrAMINE (BENADRYL) 25 mg capsule Take 25 mg by mouth every 6 (six) hours as needed for allergies.   Yes [provider]  doxazosin (CARDURA) 8 MG tablet Take 1 tablet (8 mg total) by mouth daily. Patient taking differently: Take 8 mg by mouth at bedtime. 04/24/22  Yes Alver Sorrow, NP  DULoxetine (CYMBALTA) 60 MG capsule Take 60 mg by mouth daily.   Yes [provider]  guaiFENesin (MUCINEX) 600 MG 12 hr tablet Take 1,200 mg by mouth 2 (two) times daily as needed for cough or to loosen phlegm.   Yes [provider]  guaiFENesin-dextromethorphan (ROBITUSSIN DM) 100-10 MG/5ML syrup Take 5 mLs by mouth every 4 (four) hours as needed for cough. 09/18/22  Yes Glenford Bayley, NP  hydrALAZINE (APRESOLINE) 100 MG tablet Take 1 tablet (100 mg total) by mouth 3 (three) times daily. 05/22/22  Yes Ngetich, Dinah C, NP  isosorbide mononitrate (IMDUR) 60 MG 24 hr tablet Take 60 mg by mouth daily. 10/04/22  Yes [provider]  metoprolol tartrate (LOPRESSOR) 100 MG tablet TAKE 1 TABLET BY MOUTH TWICE A DAY 03/06/23  Yes Ngetich, Dinah C, NP  multivitamin (RENA-VIT) TABS tablet Take 1 tablet by mouth daily. 01/29/23  Yes Ngetich, Dinah C, NP  ondansetron (ZOFRAN-ODT) 4 MG disintegrating tablet TAKE 1 TABLET BY MOUTH EVERY 8 HOURS AS NEEDED FOR  NAUSEA AND VOMITING 10/02/22  Yes Ngetich, Dinah C, NP  pravastatin (PRAVACHOL) 40 MG tablet TAKE 1 TABLET BY MOUTH EVERY DAY IN THE EVENING 12/04/22  Yes Ngetich, Dinah C, NP  sevelamer carbonate (RENVELA) 800 MG tablet Take 2 tablets (1,600 mg total) by mouth as needed (snacks). Patient taking differently: Take 3,200 mg by mouth 3 (three) times daily with meals. Take with a meal and snacks 11/29/21  Yes Angiulli, Mcarthur Rossetti, PA-C  traMADol (ULTRAM) 50 MG tablet Take 1 tablet (50 mg total) by mouth every 6 (six) hours as needed. Patient taking differently: Take 100 mg by mouth every 6 (six) hours as  needed. 01/29/23  Yes Ngetich, Dinah C, NP  traZODone (DESYREL) 50 MG tablet Take 0.5-1 tablets (25-50 mg total) by mouth at bedtime as needed. for sleep Patient taking differently: Take 50 mg by mouth at bedtime as needed for sleep. for sleep 05/22/22  Yes Ngetich, Dinah C, NP  VELPHORO 500 MG chewable tablet Chew 1,000 mg by mouth 3 (three) times daily. 02/06/23  Yes [provider]      Allergies    Codeine and Hydrocodone-acetaminophen    Review of Systems   Review of Systems  Respiratory:  Positive for shortness of breath.   Review of systems completed and notable as per HPI.  ROS otherwise negative.   Physical Exam Updated Vital Signs BP (!) 189/73   Pulse 84   Temp (!) 101 F (38.3 C) (Oral)   Resp (!) 31   Ht 5\' 7"  (1.702 m)   Wt 88 kg   SpO2 92%   BMI 30.39 kg/m  Physical Exam Vitals and nursing note reviewed.  Constitutional:      General: He is not in acute distress.    Appearance: He is well-developed.  HENT:     Head: Normocephalic and atraumatic.  Eyes:     Extraocular Movements: Extraocular movements intact.     Conjunctiva/sclera: Conjunctivae normal.     Pupils: Pupils are equal, round, and reactive to light.  Cardiovascular:     Rate and Rhythm: Normal rate and regular rhythm.     Pulses: Normal pulses.     Heart sounds: Normal heart sounds. No murmur heard. Pulmonary:     Effort: Pulmonary effort is normal. Tachypnea present. No respiratory distress.     Breath sounds: Decreased breath sounds present.  Abdominal:     Palpations: Abdomen is soft.     Tenderness: There is no abdominal tenderness.  Musculoskeletal:        General: No swelling.     Cervical back: Neck supple.     Right lower leg: No edema.     Left lower leg: No edema.  Skin:    General: Skin is warm and dry.     Capillary Refill: Capillary refill takes less than 2 seconds.  Neurological:     General: No focal deficit present.     Mental Status: He is alert and oriented to  person, place, and time. Mental status is at baseline.  Psychiatric:        Mood and Affect: Mood normal.     ED Results / Procedures / Treatments   Labs (all labs ordered are listed, but only abnormal results are displayed) Labs Reviewed  RESP PANEL BY RT-PCR (RSV, FLU A&B, COVID)  RVPGX2 - Abnormal; Notable for the following components:      Result Value   Influenza A by PCR POSITIVE (*)    All other  components within normal limits  BASIC METABOLIC PANEL - Abnormal; Notable for the following components:   Chloride 94 (*)    Glucose, Bld 119 (*)    BUN 26 (*)    Creatinine, Ser 7.69 (*)    GFR, Estimated 7 (*)    All other components within normal limits  CBC - Abnormal; Notable for the following components:   WBC 3.2 (*)    RBC 2.72 (*)    Hemoglobin 8.6 (*)    HCT 26.8 (*)    Platelets 56 (*)    All other components within normal limits  BRAIN NATRIURETIC PEPTIDE - Abnormal; Notable for the following components:   B Natriuretic Peptide 1,610.6 (*)    All other components within normal limits  DIFFERENTIAL - Abnormal; Notable for the following components:   Lymphs Abs 0.2 (*)    All other components within normal limits  CBC - Abnormal; Notable for the following components:   WBC 3.4 (*)    RBC 2.68 (*)    Hemoglobin 8.6 (*)    HCT 26.4 (*)    Platelets 54 (*)    nRBC 0.9 (*)    All other components within normal limits  TROPONIN I (HIGH SENSITIVITY) - Abnormal; Notable for the following components:   Troponin I (High Sensitivity) 23 (*)    All other components within normal limits  TROPONIN I (HIGH SENSITIVITY) - Abnormal; Notable for the following components:   Troponin I (High Sensitivity) 23 (*)    All other components within normal limits  CULTURE, BLOOD (ROUTINE X 2)  CULTURE, BLOOD (ROUTINE X 2)  RENAL FUNCTION PANEL  MAGNESIUM  CBC WITH DIFFERENTIAL/PLATELET  HEPATIC FUNCTION PANEL  HEPATITIS B SURFACE ANTIGEN  HEPATITIS B SURFACE ANTIBODY,  QUANTITATIVE    EKG EKG Interpretation Date/Time:  Saturday March 16 2023 11:44:47 EST Ventricular Rate:  63 PR Interval:  194 QRS Duration:  92 QT Interval:  464 QTC Calculation: 474 R Axis:   69  Text Interpretation: Normal sinus rhythm Normal ECG When compared with ECG of 08-Jan-2023 19:02, PREVIOUS ECG IS PRESENT Confirmed by Fulton Reek [phone]) on 03/16/2023 2:38:13 PM  Radiology CT Angio Chest Pulmonary Embolism (PE) W or WO Contrast Result Date: 03/16/2023 CLINICAL DATA:  Chest pain, shortness of breath EXAM: CT ANGIOGRAPHY CHEST WITH CONTRAST TECHNIQUE: Multidetector CT imaging of the chest was performed using the standard protocol during bolus administration of intravenous contrast. Multiplanar CT image reconstructions and MIPs were obtained to evaluate the vascular anatomy. RADIATION DOSE REDUCTION: This exam was performed according to the departmental dose-optimization program which includes automated exposure control, adjustment of the mA and/or kV according to patient size and/or use of iterative reconstruction technique. CONTRAST:  75mL OMNIPAQUE IOHEXOL 350 MG/ML SOLN COMPARISON:  X-ray 03/16/2023, CT 01/08/2023 FINDINGS: Cardiovascular: Satisfactory opacification of the pulmonary arteries to the segmental level. No evidence of pulmonary embolism. Thoracic aorta is nonaneurysmal. Atherosclerotic calcifications of the aorta and coronary arteries. Heart size is upper limits of normal. No pericardial effusion. Mediastinum/Nodes: Multiple mildly enlarged mediastinal and right hilar lymph nodes including 11 mm right paratracheal (series 5, image 37), 14 mm precarinal (series 5, image 56), lower right hilar node measuring 14 mm (series 5, image 63). No significant change in the degree of lymphadenopathy compared to the prior study. No axillary lymphadenopathy. Thyroid gland, trachea, and esophagus within normal limits. Lungs/Pleura: Bronchial wall thickening with multifocal  tree-in-bud opacities most pronounced within the bilateral lower lobes. Trace right pleural effusion. No pneumothorax.  Upper Abdomen: No acute abnormality. Musculoskeletal: No chest wall abnormality. No acute or significant osseous findings. Review of the MIP images confirms the above findings. IMPRESSION: 1. No evidence of pulmonary embolism. 2. Bronchial wall thickening with multifocal tree-in-bud opacities most pronounced within the bilateral lower lobes, suggestive of an infectious or inflammatory bronchiolitis. 3. Trace right pleural effusion. 4. Multiple mildly enlarged mediastinal and right hilar lymph nodes, not significantly changed from the prior study. Findings are nonspecific and may be reactive. 5. Aortic and coronary artery atherosclerosis (ICD10-I70.0). Electronically Signed   By: Duanne Guess D.O.   On: 03/16/2023 20:45   DG Chest 2 View Result Date: 03/16/2023 CLINICAL DATA:  Shortness of breath, chest pain. EXAM: CHEST - 2 VIEW COMPARISON:  February 28, 2023. FINDINGS: Stable cardiomegaly with mild central pulmonary vascular congestion. Minimal bibasilar densities are noted which may represent edema or scarring. Bony thorax is unremarkable. IMPRESSION: Stable cardiomegaly with mild central pulmonary vascular congestion. Minimal bibasilar edema or scarring is noted. Electronically Signed   By: Lupita Raider M.D.   On: 03/16/2023 13:19   CT Head Wo Contrast Result Date: 03/16/2023 CLINICAL DATA:  Headache, increasing frequency or severity EXAM: CT HEAD WITHOUT CONTRAST TECHNIQUE: Contiguous axial images were obtained from the base of the skull through the vertex without intravenous contrast. RADIATION DOSE REDUCTION: This exam was performed according to the departmental dose-optimization program which includes automated exposure control, adjustment of the mA and/or kV according to patient size and/or use of iterative reconstruction technique. COMPARISON:  CT head 01/09/2023. FINDINGS:  Brain: No evidence of acute infarction, hemorrhage, hydrocephalus, extra-axial collection or mass lesion/mass effect. Vascular: No hyperdense vessel. Skull: No acute fracture. Sinuses/Orbits: Clear sinuses.  No acute orbital findings. Other: No mastoid effusions. IMPRESSION: No evidence of acute intracranial abnormality. Electronically Signed   By: Feliberto Harts M.D.   On: 03/16/2023 12:59    Procedures Procedures    Medications Ordered in ED Medications  heparin injection 5,000 Units (has no administration in time range)  azithromycin (ZITHROMAX) 500 mg in sodium chloride 0.9 % 250 mL IVPB (has no administration in time range)  oseltamivir (TAMIFLU) capsule 30 mg (has no administration in time range)  ipratropium-albuterol (DUONEB) 0.5-2.5 (3) MG/3ML nebulizer solution 3 mL (3 mLs Nebulization Given 03/16/23 1949)  guaiFENesin (MUCINEX) 12 hr tablet 600 mg (has no administration in time range)  vancomycin (VANCOREADY) IVPB 2000 mg/400 mL (has no administration in time range)  ceFEPIme (MAXIPIME) 1 g in sodium chloride 0.9 % 100 mL IVPB (has no administration in time range)  vancomycin (VANCOCIN) IVPB 1000 mg/200 mL premix (has no administration in time range)  amLODipine (NORVASC) tablet 10 mg (10 mg Oral Given 03/16/23 2001)  aspirin chewable tablet 81 mg (81 mg Oral Given 03/16/23 2001)  doxazosin (CARDURA) tablet 8 mg (has no administration in time range)  DULoxetine (CYMBALTA) DR capsule 60 mg (60 mg Oral Given 03/16/23 2001)  hydrALAZINE (APRESOLINE) tablet 100 mg (has no administration in time range)  isosorbide mononitrate (IMDUR) 24 hr tablet 60 mg (0 mg Oral Hold 03/16/23 2051)  metoprolol tartrate (LOPRESSOR) tablet 100 mg (has no administration in time range)  multivitamin (RENA-VIT) tablet 1 tablet (has no administration in time range)  pravastatin (PRAVACHOL) tablet 40 mg (40 mg Oral Given 03/16/23 2001)  sevelamer carbonate (RENVELA) tablet 3,200 mg (has no administration in  time range)  sucroferric oxyhydroxide (VELPHORO) chewable tablet 1,000 mg (has no administration in time range)  acetaminophen (TYLENOL) tablet 650 mg (  has no administration in time range)  Chlorhexidine Gluconate Cloth 2 % PADS 6 each (has no administration in time range)  ipratropium-albuterol (DUONEB) 0.5-2.5 (3) MG/3ML nebulizer solution 3 mL (3 mLs Nebulization Given 03/16/23 1525)  acetaminophen (TYLENOL) tablet 1,000 mg (1,000 mg Oral Given 03/16/23 1712)  ceFEPIme (MAXIPIME) 2 g in sodium chloride 0.9 % 100 mL IVPB (0 g Intravenous Stopped 03/16/23 2026)  iohexol (OMNIPAQUE) 350 MG/ML injection 75 mL (75 mLs Intravenous Contrast Given 03/16/23 2015)    ED Course/ Medical Decision Making/ A&P Clinical Course as of 03/16/23 2059  Sat Mar 16, 2023  1453 Patient was taken off oxygen became hypoxic to low 80s% with good waveform.  Oxygen reapplied which is a new oxygen requirement for him. [JD]  1654 Discussed with family medicine for admission. [JD]    Clinical Course User Index [JD] Laurence Spates, MD                                 Medical Decision Making Amount and/or Complexity of Data Reviewed Labs: ordered. Radiology: ordered.  Risk OTC drugs. Prescription drug management. Decision regarding hospitalization.   Medical Decision Making:   Kirk [name] is a 63 y.o. male who presented to the ED today with shortness of breath.  Vital signs reviewed and for hypertension.  On exam he does have tachypnea and slightly increased work of breathing.  He is slightly diminished breath sounds no clear wheezing or rales.  He is found positive for the flu, BNP is elevated although very close to where he was previously.  Troponin is slightly elevated, EKG is nonischemic.  No chest pain, lower concern for ACS.  I suspect his symptoms are likely related to some volume overload and his flu infection.  I do not see obvious pneumonia, he does have some mild pulmonary edema and vascular  congestion.  He reports some wheezing at home, we will trial a DuoNeb and evaluate his oxygen with ambulation.   Patient placed on continuous vitals and telemetry monitoring while in ED which was reviewed periodically.  Reviewed and confirmed nursing documentation for past medical history, family history, social history.  Reassessment and Plan:   Patient has been improvement in breathing with DuoNeb.  He remains hypoxic.  I suspect his hypoxia is related to influenza and volume overload.  I spoke with both nephrology and the medicine service and he was admitted for further management.   Patient's presentation is most consistent with acute complicated illness / injury requiring diagnostic workup.           Final Clinical Impression(s) / ED Diagnoses Final diagnoses:  Hypoxia    Rx / DC Orders ED Discharge Orders     None         Laurence Spates, MD 03/16/23 2059

## 2023-03-16 NOTE — H&P (Signed)
 Hospital Admission History and Physical Service Pager: [phone]  Patient name: Kirk [name] Medical record number: [id number] Date of Birth: 1960-08-28 Age: 63 y.o. Gender: male  Primary Care Provider: Caesar Bookman, NP Consultants: Nephrology Code Status: Full code Preferred Emergency Contact:  Contact Information     Name Relation Home Work Mobile   Yancy, Knoble Spouse [phone]  [phone]      Other Contacts   None on File      Chief Complaint: Dyspnea  Assessment and Plan: Kirk [name] is a 63 y.o. male presenting with shortness of breath and found to be in acute hypoxic respiratory failure. PMHx includes ESRD on HD (MWF), Alport syndrome s/p kidney transplant, metastatic breast cancer, chronic pancytopenia, recurrent pneumonia, OSA on CPAP, CAD, mild persistent asthma, systolic heart failure, GI bleed, subdural hematoma, HTN and T2DM.  Differential for this patient's presentation of this includes: Pneumonia: Lung exam with significant crackles in the mid to lower right lobe and diffuse rhonchi concerning for underlying pneumonia.  Per chart review treated for recurrent pneumonia, possibly in the setting of aspiration.  Given pancytopenia and metastatic cancer on chemotherapy, will treat as febrile neutropenia. Influenza A: Positive in the ED.  Likely contributing to dyspnea and high risk for severe infection given underlying comorbidities. Heart failure exacerbation: BNP elevated but consistent with prior values and does not appear grossly hypervolemic upon exam.  EF 50 to 55% 12/2022.  Only had 1 L offered HD yesterday and does not make urine therefore will defer to nephrology for further dialysis needs. PE: Wells score 2.5 but given severity of presentation will obtain CT PE to rule out underlying thrombus. ACS: EKG normal and troponins flat but endorsing chest pain upon exam.  Patient high risk given multiple underlying comorbidities, will continue  cardiac monitoring but unlikely contributing at this time. Asthma exacerbation: H/o mild persistent asthma on Advair and sees pulmonology.  Possibly underlying viral process triggered asthma exacerbation but will hold off on steroids given pancytopenia and immunosuppression.  Assessment & Plan Acute hypoxic respiratory failure (HCC) New oxygen requirement, multifactorial as above but most concerning for underlying pneumonia.  Patient high risk given pancytopenia and metastatic cancer on chemotherapy therefore will start broad coverage and narrow as able.  Obtain CT PE to rule out thrombus and better visualize lungs as CXR was inconsistent with exam. -Admit to FM TS, Dr. Jacquelyne Balint attending -Cefepime, azithromycin and vancomycin coverage -MRSA swab, plan to d/c vancomycin if negative -CT PE, OK per Nephro -DuoNebs every 4 hour -Supportive care: Tylenol, Mucinex -NPO pending SLP eval -Blood cultures ESRD (end stage renal disease) on dialysis (HCC) MWF, has not missed a session.  Does not appear grossly hypervolemic but will defer to nephrology given patient does not make urine. 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 -Nephrology consulted, appreciate care -Continue home Phos binders (HFpEF) heart failure with preserved ejection fraction (HCC) EF 50 to 55% in 12/2022.  Volume management as above. -Echo -Continue home meds Imdur, Metoprolol Influenza A -Tamiflu 30 mg x 1, with dialysis x 4 doses Pancytopenia (HCC) CBC similar to prior although platelets trended down to 56K.  Likely in the setting of multiple comorbidities per Heme/Onc. -Trend CBC  Chronic and Stable Conditions: HTN: Continue amlodipine 10 mg daily, hydralazine 100 mg 3 times daily T2DM: Last A1c 5.2 in 01/2023.  Diet controlled.  Daily fasting CBG Insomnia: Continue doxazosin 8 mg nightly Depression: Continue  duloxetine 60 mg daily CAD: Continue ASA 81 mg HLD:  Continue pravastatin 40 mg daily OSA: CPAP nightly  FEN/GI: NPO pending SLP VTE Prophylaxis: Heparin SQ  Disposition: Med tele  History of Present Illness:  Kirk [name] is a 63 y.o. male presenting with worsening dyspnea since 2/18.  Patient wife at bedside and provided majority of history.  States patient has been feeling bad since Tuesday with cough and congestion.  Unsure if he had a fever but felt warm.  Patient did not want to seek care and thought he may feel better after dialysis yesterday but symptoms persisted.  Only had 1 L off at HD, reports he restricts fluids at home.  Denies N/V/D.  No known sick contact.  Endorses headache without focal weakness. Endorses some chest pain, chronic in origin but worsening in the setting of his shortness of breath.  Reduced intake but does sip on fluids throughout the day.  No baseline oxygen requirement. Felt like he needed his CPAP more over the last few days at night.  Gets short of breath with basic activities at baseline.  Wife checks infrequently for volume excess and reports it mostly shows up with swelling of his hands which she has not noticed.  She feels he has been audibly wheezing at home.  Per pulmonology note from 02/2023 concern for ongoing aspiration pneumonia, pending SLP eval. wife reiterated concern he is regurgitating food that is going into his lungs.  Has been treated for pneumonia multiple times in the past without relief of symptoms.  Does not make urine at baseline.  Denies abdominal pressure or fullness.  Wife reports he occasionally gets confused due to history of subdural hematoma.  Able to complete ADLs at baseline.  In the ED, found to be hypoxic and started on nasal cannula that was titrated up to 5 L at the time of admission.  Influenza A positive.  BMP elevated but near baseline, possible volume component therefore nephrology was paged for consultation.  Review Of Systems: Per HPI   Pertinent Past Medical  History: Mild persistent asthma Systolic heart failure Hypertension OSA on CPAP T2DM ESRD on dialysis (MWF) since 2015 Pancytopenia (?in the setting of multiple chronic conditions per Heme) Metastatic breast cancer on Enhertu Alport syndrome s/p kidney transplant Recurrent pneumonia Subdural hematoma GI bleed CAD (h/o MI) Remainder reviewed in history tab.   Pertinent Past Surgical History: AV fistula Back surgery Cardiac cath Cholecystectomy Kidney transplant Nephrectomy Remainder reviewed in history tab.   Pertinent Social History: Tobacco use: Former, quit in 04/2020.  16-pack-year history. Alcohol use: None Other Substance use: None Lives with wife and daughter at home. 5 dogs  Pertinent Family History: Remainder reviewed in history tab.   Important Outpatient Medications: *Did not take any medications today  Albuterol as needed Amlodipine 10 mg daily Aspirin 81 mg daily Calcitriol 1 mcg with dialysis Cefdinir 300 mg with dialysis Cinacalcet 60 mg 3 times per week Doxazosin 8 mg daily Advair 2 puffs twice daily Phos renal with meals Hydralazine 100 mg 3 times daily  Imdur 120 mg daily Metoprolol 100 mg twice daily Pantoprazole 40 mg twice daily Pravastatin 40 mg daily Tramadol 50 mg every 6 hours as needed Trazodone 25 to 50 mg nightly as needed Remainder reviewed in medication history.   Objective: BP (!) 192/72   Pulse 83   Temp 98.3 F (36.8 C)   Resp (!) 31   Ht 5\' 7"  (1.702 m)   Wt 88 kg  SpO2 90%   BMI 30.39 kg/m  Exam: General: Sitting up at the edge of the bed, with head down.  NAD. Eyes: PERRLA.  EOMI.  Anicteric sclera. ENTM: Dry mucous membranes.  No palpable cervical lymphadenopathy.  No pharyngeal erythema. Neck: Supple, full range of motion.  No notable JVD. Cardiovascular: RRR.  3/6 systolic murmur. Respiratory: Tachypnea on 5 L nasal cannula.  Diffuse rhonchi with notable crackles in the right middle and lower lung.  Speaking  in 2-3 word phrases. Gastrointestinal: Soft, nontender, nondistended.  + BS. MSK: No peripheral edema noted.  Well-perfused. Derm: Warm, dry. Neuro: A&O x 4.  5/5 grip strength and hip flexion bilaterally.  Cranial nerves intact. Psych: Fatigued appearing.  Cooperative.  Labs:  CBC BMET  Recent Labs  Lab 03/16/23 1152  WBC 3.2*  HGB 8.6*  HCT 26.8*  PLT 56*   Recent Labs  Lab 03/16/23 1152  NA 138  K 3.6  CL 94*  CO2 31  BUN 26*  CREATININE 7.69*  GLUCOSE 119*  CALCIUM 9.6    Pertinent additional labs: BNP: 1610 Troponin: 23 Quad screen: Influenza A +  EKG: NSR   Imaging Studies Performed: DG Chest 2 View Result Date: 03/16/2023 IMPRESSION: Stable cardiomegaly with mild central pulmonary vascular congestion. Minimal bibasilar edema or scarring is noted.  CT Head Wo Contrast Result Date: 03/16/2023 IMPRESSION: No evidence of acute intracranial abnormality.  Elberta Fortis, MD 03/16/2023, 4:51 PM PGY-2, [facility]  FPTS Intern pager: [phone], text pages welcome Secure chat group [facility] Teaching Service

## 2023-03-16 NOTE — Assessment & Plan Note (Addendum)
 New oxygen requirement, multifactorial as above but most concerning for underlying pneumonia.  Patient high risk given pancytopenia and metastatic cancer on chemotherapy therefore will start broad coverage and narrow as able.  Obtain CT PE to rule out thrombus and better visualize lungs as CXR was inconsistent with exam. -Admit to FM TS, Dr. Jacquelyne Balint attending -Cefepime, azithromycin and vancomycin coverage -MRSA swab, plan to d/c vancomycin if negative -CT PE, OK per Nephro -DuoNebs every 4 hour -Supportive care: Tylenol, Mucinex -NPO pending SLP eval -Blood cultures

## 2023-03-16 NOTE — ED Notes (Signed)
 Antibiotics ordered prior to blood cultures ordered; provider called this RN and requested cultures drawn prior to abx administration. Receiving RN notified.

## 2023-03-16 NOTE — ED Provider Triage Note (Signed)
 Emergency Medicine Provider Triage Evaluation Note  Kirk [name] , a 63 y.o. male  was evaluated in triage.  Pt complains of chest pain, SOB.  Review of Systems  Positive:  Negative:   Physical Exam  BP (!) 157/67 (BP Location: Right Arm)   Pulse 63   Temp 98.3 F (36.8 C)   Resp 20   Ht 5\' 7"  (1.702 m)   Wt 88 kg   SpO2 97%   BMI 30.39 kg/m  Gen:   Awake, no distress   Resp:  Normal effort  MSK:   Moves extremities without difficulty  Other:    Medical Decision Making  Medically screening exam initiated at 12:24 PM.  Appropriate orders placed.  Kirk [name] was informed that the remainder of the evaluation will be completed by another provider, this initial triage assessment does not replace that evaluation, and the importance of remaining in the ED until their evaluation is complete.  Last dialysis treatment yesterday afternoon. Patient stating that there is fluid on his lungs - thinks it is CHF related. Complaining of headache, chest pain, SOB, cough, wheezing. Denies fever, nausea, vomiting, diarrhea. Endorses rhinorrhea, congestion.  Family member is concerned about patient's worsening headache x3 days because he has hx of brain bleed.   Kirk [name], [place] 03/16/23 1226

## 2023-03-16 NOTE — ED Notes (Addendum)
 ED phlebotomy made aware of need for blood cultures prior to antibiotic administration.

## 2023-03-16 NOTE — Plan of Care (Addendum)
 ED nurse messaged about EKG and troponin ordered at 2103 not obtained still. Patient moved to HD at 2245 I called and spoke with HD staff on phone who is able to obtain troponin, unable to obtain EKG

## 2023-03-16 NOTE — Assessment & Plan Note (Signed)
-  Tamiflu 30 mg x 1, with dialysis x 4 doses

## 2023-03-16 NOTE — Assessment & Plan Note (Addendum)
 EF 50 to 55% in 12/2022.  Volume management as above. -Echo -Continue home meds Imdur, Metoprolol

## 2023-03-16 NOTE — Consult Note (Signed)
 Renal Service Consult Note [facility]  Kirk [name] 03/16/2023 Kirk Krabbe, MD Requesting Physician: Dr. McDiarmid  Reason for Consult: [name] w/ SOB HPI: The patient is a 63 y.o. year-old w/ PMH as below who presented to ED c/o SOB. Has had cough, congestion, feeling warm since Tuesday. Only had 1 L off at HD unit. No n/v/d, no sick contacts. +HA. Chronic chest pain. Does not wear O2 at home. Apparently per the H&P the pt is f/b Pulmonary due to hx of aspiration pna. Has been rx'd for pna multiple times in the past w/o relief. Hx of SDH and so pt occasionally gets confused. In the ED was found to be hypoxic and given [place] O2 at 2L then titrated up to 5 L . Flu A was positive. CXR shows vasc congestion and bibasilar IS edema vs scarring.   Pt seen in ED room. Pt is visibly SOB, not in distress, but uncomfortable. Main c/o is SOB. Wife states when his hands get "puffy" that's a sign that he has extra fliud on.    PMH Mild persistent asthma Systolic heart failure Hypertension OSA on CPAP T2DM ESRD on dialysis (MWF) since 2015 Pancytopenia (?in the setting of multiple chronic conditions per Heme) Metastatic breast cancer on Enhertu Alport syndrome s/p kidney transplant Recurrent pneumonia Subdural hematoma GI bleed CAD (h/o MI)   ROS - denies CP, no joint pain, no HA, no blurry vision, no rash, no diarrhea, no nausea/ vomiting, no dysuria, no difficulty voiding   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)    Anxiety    Asthma    Asthma    moderate persistent   Carpal tunnel syndrome    Cellulitis and abscess of trunk    Cholelithiasis 07/13/2014   Chronic headaches    Cigarette smoker 07/11/2017   Stopped 2/022  - referred to start Lung cancer screening 04/2021         Debility,  unspecified    Dermatophytosis of the body    Dysrhythmia    history of   Edema    End stage renal disease on dialysis (HCC)    "MWF; [facility] in [place]" (10/21/2014)   Essential hypertension, benign    Generalized anxiety disorder 05/25/2022   GERD (gastroesophageal reflux disease)    Gout, unspecified    HTN (hypertension)    Hypertrophy of prostate without urinary obstruction and other lower urinary tract symptoms (LUTS)    Hypotension, unspecified    Impotence of organic origin    Insomnia, unspecified    Kidney replaced by transplant    Localization-related (focal) (partial) epilepsy and epileptic syndromes with complex partial seizures, without mention of intractable epilepsy    12-15-19- Wife states he has NEVER had a seizure    Lumbago    Memory loss    OSA on CPAP    Other and unspecified hyperlipidemia    controlled /managed per wife    Other chronic nonalcoholic liver disease    Other malaise and fatigue    Other nonspecific abnormal serum enzyme levels    Pain in joint, lower leg    Pain in joint, upper arm    Pneumonia "several times"   PONV (postoperative nausea and vomiting)    Renal dialysis status(V45.11) 02/05/2010  restarted 01/02/13 ofter renal trransplant failure   Secondary hyperparathyroidism (of renal origin)    Shortness of breath    Sleep apnea    wears cpap    Tension headache    Unspecified constipation    Unspecified essential hypertension    Unspecified hereditary and idiopathic peripheral neuropathy    Unspecified vitamin D deficiency    Past Surgical History  Past Surgical History:  Procedure Laterality Date   AV FISTULA PLACEMENT Left ?2010   "forearm; at [facility]"   BACK SURGERY     CARDIAC CATHETERIZATION  03/21/2011   CHOLECYSTECTOMY N/A 10/21/2014   Procedure: LAPAROSCOPIC CHOLECYSTECTOMY WITH INTRAOPERATIVE CHOLANGIOGRAM;  Surgeon: Chevis Pretty III, MD;  Location: [facility] OR;  Service: General;  Laterality:  N/A;   COLONOSCOPY     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   IR ANGIO INTRA EXTRACRAN SEL INTERNAL CAROTID UNI R MOD SED  11/30/2021   IR ANGIOGRAM FOLLOW UP STUDY  11/30/2021   IR AV DIALY SHUNT INTRO NEEDLE/INTRACATH INITIAL W/PTA/IMG LEFT  12/13/2022   IR BONE MARROW BIOPSY & ASPIRATION  03/27/2022   IR NEURO EACH ADD'L AFTER BASIC UNI RIGHT (MS)  11/30/2021   IR TRANSCATH/EMBOLIZ  11/30/2021   IR US GUIDE VASC ACCESS LEFT  12/13/2022   KIDNEY TRANSPLANT  08/17/2011   [facility]    LAPAROSCOPIC CHOLECYSTECTOMY  10/21/2014   w/IOC   LEFT HEART CATHETERIZATION WITH CORONARY ANGIOGRAM N/A 03/21/2011   Procedure: LEFT HEART CATHETERIZATION WITH CORONARY ANGIOGRAM;  Surgeon: Chrystie Nose, MD;  Location: [facility] CATH LAB;  Service: Cardiovascular;  Laterality: N/A;   NEPHRECTOMY  08/2013   removed transplaned kidney   POLYPECTOMY     POSTERIOR FUSION CERVICAL SPINE  06/25/2012   for spinal stenosis   RADIOLOGY WITH ANESTHESIA N/A 11/30/2021   Procedure: RIGHT MIDDLE MENINGEAL ARTERY EMBOLIZATION;  Surgeon: Radiologist, Medication, MD;  Location: [facility] OR;  Service: Radiology;  Laterality: N/A;   VASECTOMY  2010   Family History  Family History  Adopted: Yes  Problem Relation Age of Onset   Colon cancer Neg Hx    Esophageal cancer Neg Hx    Rectal cancer Neg Hx    Stomach cancer Neg Hx    Colon polyps Neg Hx    Social History  reports that he quit smoking about 3 years ago. His smoking use included cigarettes. He started smoking about 32 years ago. He has a 16 pack-year smoking history. He has never used smokeless tobacco. He reports that he does not drink alcohol and does not use drugs. Allergies  Allergies  Allergen Reactions   Codeine Nausea And Vomiting   Hydrocodone-Acetaminophen Nausea And Vomiting   Home medications Prior to Admission medications   Medication Sig Start Date End Date Taking? Authorizing Provider  acetaminophen (TYLENOL) 500 MG tablet Take 1 tablet (500  mg total) by mouth every 6 (six) hours as needed for moderate pain. 09/12/14  Yes Standley Brooking, MD  albuterol (PROVENTIL) (2.5 MG/3ML) 0.083% nebulizer solution Take 3 mLs (2.5 mg total) by nebulization every 6 (six) hours as needed for wheezing or shortness of breath. 11/13/22  Yes Glenford Bayley, NP  albuterol (VENTOLIN HFA) 108 (90 Base) MCG/ACT inhaler Inhale 2 puffs into the lungs every 6 (six) hours as needed for wheezing or shortness of breath. 10/29/22  Yes Nyoka Cowden, MD  amLODipine (NORVASC) 10 MG tablet TAKE 1 TABLET BY MOUTH EVERY DAY 12/05/22  Yes Ngetich, Carilyn Goodpasture  C, NP  aspirin 81 MG chewable tablet Chew 81 mg by mouth daily. 09/28/20  Yes [provider]  Cholecalciferol (VITAMIN D3) 25 MCG (1000 UT) CAPS Take 4,000 Units by mouth daily. Take at home on Tues, Thurs, Sat, and Sun   Yes [provider]  Dextromethorphan Polistirex (ROBITUSSIN 12 HOUR COUGH PO) Take 1 Dose by mouth 2 (two) times daily as needed (cough).   Yes [provider]  diphenhydrAMINE (BENADRYL) 25 mg capsule Take 25 mg by mouth every 6 (six) hours as needed for allergies.   Yes [provider]  doxazosin (CARDURA) 8 MG tablet Take 1 tablet (8 mg total) by mouth daily. Patient taking differently: Take 8 mg by mouth at bedtime. 04/24/22  Yes Alver Sorrow, NP  DULoxetine (CYMBALTA) 60 MG capsule Take 60 mg by mouth daily.   Yes [provider]  guaiFENesin (MUCINEX) 600 MG 12 hr tablet Take 1,200 mg by mouth 2 (two) times daily as needed for cough or to loosen phlegm.   Yes [provider]  guaiFENesin-dextromethorphan (ROBITUSSIN DM) 100-10 MG/5ML syrup Take 5 mLs by mouth every 4 (four) hours as needed for cough. 09/18/22  Yes Glenford Bayley, NP  hydrALAZINE (APRESOLINE) 100 MG tablet Take 1 tablet (100 mg total) by mouth 3 (three) times daily. 05/22/22  Yes Ngetich, Dinah C, NP  isosorbide mononitrate (IMDUR) 60 MG 24 hr tablet Take 60 mg by mouth  daily. 10/04/22  Yes [provider]  metoprolol tartrate (LOPRESSOR) 100 MG tablet TAKE 1 TABLET BY MOUTH TWICE A DAY 03/06/23  Yes Ngetich, Dinah C, NP  multivitamin (RENA-VIT) TABS tablet Take 1 tablet by mouth daily. 01/29/23  Yes Ngetich, Dinah C, NP  ondansetron (ZOFRAN-ODT) 4 MG disintegrating tablet TAKE 1 TABLET BY MOUTH EVERY 8 HOURS AS NEEDED FOR NAUSEA AND VOMITING 10/02/22  Yes Ngetich, Dinah C, NP  pravastatin (PRAVACHOL) 40 MG tablet TAKE 1 TABLET BY MOUTH EVERY DAY IN THE EVENING 12/04/22  Yes Ngetich, Dinah C, NP  sevelamer carbonate (RENVELA) 800 MG tablet Take 2 tablets (1,600 mg total) by mouth as needed (snacks). Patient taking differently: Take 3,200 mg by mouth 3 (three) times daily with meals. Take with a meal and snacks 11/29/21  Yes Angiulli, Mcarthur Rossetti, PA-C  traMADol (ULTRAM) 50 MG tablet Take 1 tablet (50 mg total) by mouth every 6 (six) hours as needed. Patient taking differently: Take 100 mg by mouth every 6 (six) hours as needed. 01/29/23  Yes Ngetich, Dinah C, NP  traZODone (DESYREL) 50 MG tablet Take 0.5-1 tablets (25-50 mg total) by mouth at bedtime as needed. for sleep Patient taking differently: Take 50 mg by mouth at bedtime as needed for sleep. for sleep 05/22/22  Yes Ngetich, Dinah C, NP  VELPHORO 500 MG chewable tablet Chew 1,000 mg by mouth 3 (three) times daily. 02/06/23  Yes [provider]     Vitals:   03/16/23 1649 03/16/23 1700 03/16/23 1700 03/16/23 1715  BP:    (!) 189/73  Pulse:    84  Resp:    (!) 31  Temp:  (!) 101 F (38.3 C) (!) 101 F (38.3 C)   TempSrc:  Oral Oral   SpO2: 90%   92%  Weight:      Height:       Exam Gen alert, no distress No rash, cyanosis or gangrene Sclera anicteric, throat clear  No jvd or bruits Chest clear bilat to bases, no rales/ wheezing RRR  no MRG Abd soft ntnd no mass or ascites +bs GU defer MS no joint effusions or deformity Ext no LE or UE edema, no other edema Neuro is alert, Ox 3 , nf     LUA AVF+bruit       Renal-related home meds: - norvasc 10 - cardura 8 mg hs - hydralazine 100 tid - metoprolol 100 bid - renavite - renvela 1600 ac tid - velphoro 1 gm ac tid    OP HD: MWF NW 4h  B450   90kg  2K bath  L AVF  Heparin 2500 - to follow   Assessment/ Plan: SOB / Acute hypoxic resp failure - CXR w/ congestion, poss early IS edema. Only 1 L off w/ his last HD, is probably losing body wt. Having obvious difficulty breathing. Plan HD upstairs as soon as possible.  ESRD - on HD MWF. HD as above.  HTN - BP's high, get vol down w/ HD tonight.  Volume - as above, most likely pulm edema. HD tonight, max UF 3- 4L.  Anemia of esrd - Hb 8.6 here, no esa needs tonight.  Secondary hyperparathyroidism - Ca in range, add on phos/ alb.  Alport's syndrome      Vinson Moselle  MD [facility] 03/16/2023, 7:35 PM  Recent Labs  Lab 03/16/23 1152  HGB 8.6*  CALCIUM 9.6  CREATININE 7.69*  K 3.6   Inpatient medications:  amLODipine  10 mg Oral Daily   aspirin  81 mg Oral Daily   doxazosin  8 mg Oral QHS   DULoxetine  60 mg Oral Daily   guaiFENesin  600 mg Oral BID   heparin  5,000 Units Subcutaneous Q8H   hydrALAZINE  100 mg Oral TID   ipratropium-albuterol  3 mL Nebulization Q4H   isosorbide mononitrate  60 mg Oral Daily   metoprolol tartrate  100 mg Oral BID   multivitamin  1 tablet Oral Daily   oseltamivir  30 mg Oral Q M,W,F   pravastatin  40 mg Oral Daily   [START ON 03/17/2023] sevelamer carbonate  3,200 mg Oral TID WC   sucroferric oxyhydroxide  1,000 mg Oral TID    azithromycin     [START ON 03/17/2023] ceFEPime (MAXIPIME) IV     ceFEPime (MAXIPIME) IV     [START ON 03/18/2023] vancomycin     vancomycin     acetaminophen

## 2023-03-16 NOTE — Assessment & Plan Note (Signed)
 CBC similar to prior although platelets trended down to 56K.  Likely in the setting of multiple comorbidities per Heme/Onc. -Trend CBC

## 2023-03-16 NOTE — Hospital Course (Addendum)
 Kirk [name] is a 63 y.o. male with a pertinent PMH of ESRD on HD (MWF) as well as Alport syndrome s/p kidney transplant, metastatic breast cancer ([place]), chronic pancytopenia, OA on CPAP, CAD, HFrEF, HTN, and T2DM who presented with dyspnea and was admitted to the [facility] for AHRF in setting of influenza A infection.  AHRF  Flu A+  Patient presenting for worsening shortness of breath over 1 week, febrile on admission to 101F, with hypoxia to the 90s requiring nasal cannula.  Respiratory panel positive for influenza A.  CBC showed pancytopenia as below.  He was placed on nasal cannula as well as CPAP when sleeping.  He received short course of cefepime, vancomycin, azithromycin but was discontinued due to his improvement.  He was started on Tamiflu 2/25 with HD and completed 2 doses prior to discontinuing 2/28.  He completed 5 day steroid course on 2/27 and maintenance inhalers with Breo Ellipta and Incruse Ellipta were started.  By discharge, he was weaned to room air and breathing comfortably.  When ambulating with RN and PT, patient's O2 saturation ranged between 88 and 91% on room air, but with minimal dyspnea.  Patient declined evaluation for CIR and declined HHPT, preferring to go directly home without PT follow up.  Wheelchair DME ordered at discharge.  ESRD on HD MWF  Alport Syndrome s/p failed renal transplant  Only tolerated 1hr of HD at session prior to admission.  Patient received hemodialysis over admission with large volume fluid removal multiple times, net negative ~10.7L at time of discharge.  Fluid status improved and achieved 83.8 kg dry weight.  Instructed to resume MWF schedule at time of discharge.  HFmrEF, PAH  Orthostatic hypotension BNP elevated to 1600 on admission.  Patient with large volume removed in HD over admission.  Repeat echo demonstrated decreased EF from prior at 45 to 50% with G2DD.  Pulmonary artery systolic pressure also moderately  elevated.  Imdur and metoprolol were continued on admission; however, due to positive orthostatic BP, his metoprolol was reduced to 50 mg BID.  Home hydralazine was likewise decreased to 50 mg BID.  Volume status remained stable with continued HD.  Pancytopenia  Metastatic breast cancer Chronic, sees hematology oncology outpatient.  Last Neulasta injection was mid-January.  Became neutropenic briefly but recovered well on serial lab studies.  He was evaluated by his primary heme/onc, Dr. Pamelia Hoit, and followed throughout admission.  Did report some darkened stools prior to admission and FOBT was attempted, but unable to be obtained prior to dischage.  No interventions were necessary and he remained above platelet/hemoglobin thresholds without need for transfusion.  Dysphagia  Reported choking sensation and globus sensation that are both aggravated and precipitated with swallowing.  SLP evaluated and MBS were obtained, which were generally unremarkable except possible short-segment narrowing or distal esophageal ring that which caused stoppage of a 13 mm barium tablet.  Unclear etiology.  He was started on 20 mg Protonix daily and ultimately increased to 40 mg given persistent symptoms.  Sent recommendation to PCP for GI referral on outpatient basis and [facility] phone number as well as ambulatory referral at time of discharge provided.  Other chronic conditions were medically managed with home medications and formulary alternatives as necessary (hypertension, T2DM, depression, CAD, HLD, OSA)  PCP Follow-up Recommendations: Patient refused inpatient rehab, discharged home directly Follow up respiratory status, started on Breo Ellipta and Incruse Ellipta given underlying asthma Resume MWF HD schedule starting 03/25/23 with new EDW  83.5 kg, discontinued sevelamer per Nephro Decreased metoprolol to 50 mg BID, hydralazine 50 mg TID, and doxazosin 4 mg nightly in setting of orthostatic hypotension; evaluate  for dizziness/orthostatic symptoms and falls risk Please place GI referral for dysphagia and dark stool follow up in setting of pancytopenia but stable hemoglobin, started pantoprazole 40 mg daily

## 2023-03-16 NOTE — ED Triage Notes (Signed)
 Pt here pov stating sob since Tuesday.  States he feels his lungs are filled with fluid.  Was hoping dialysis on yesterday would improve symptoms, but it did not.  Wife states only 1 L removed yesterday during dialysis.    Pt also c/o chest pain and headache.

## 2023-03-16 NOTE — Plan of Care (Signed)
 FMTS Brief Progress Note  S:Saw patient at bedside for nighttime rounding. Having dyspnea, left sided squeezing chest pain (he states worse than his normal), and feeling warm- has fan on him.  O: BP (!) 189/73   Pulse 84   Temp (!) 101 F (38.3 C) (Oral)   Resp (!) 31   Ht 5\' 7"  (1.702 m)   Wt 88 kg   SpO2 92%   BMI 30.39 kg/m    Gen: Ill appearing but nontoxic, awake, alert, responsive to all questions Resp: Speaking full sentences, saturating in high 90s on 5 L nasal cannula, mildly dyspneic, coarse crackles throughout posterior lung fields CV: RRR no murmurs rubs or gallops, 2+ radial pulses bilaterally  A/P:  Acute hypoxic respiratory failure Still dyspneic on examination although.  CT PE negative for clots but does show bronchiolitis.  First troponins were flat but given increase in symptoms of left-sided squeezing chest pain will repeat troponin and obtain EKG. -HD tonight per nephro (HD called and he is next up) -EKG, repeat troponin -guaifenesin -Continue cefepime, azithro, vanc  -Continue tamiflu -CPAP at bedtime   Levin Erp, MD 03/16/2023, 9:08 PM PGY-3, [facility] Night Resident  Please page [phone] with questions.

## 2023-03-16 NOTE — Progress Notes (Signed)
   03/16/23 2138  BiPAP/CPAP/SIPAP  $ Non-Invasive Ventilator  Non-Invasive Vent Initial  $ Face Mask Small Yes  BiPAP/CPAP/SIPAP Pt Type Adult  BiPAP/CPAP/SIPAP DREAMSTATIOND  Mask Type Full face mask  Mask Size Small  PEEP 12 cmH20  Flow Rate 5 lpm  CPAP/SIPAP surface wiped down Yes  BiPAP/CPAP /SiPAP Vitals  Resp (!) 31  SpO2 95 %  Bilateral Breath Sounds Coarse crackles  MEWS Score/Color  MEWS Score 2  MEWS Score Color Yellow

## 2023-03-17 DIAGNOSIS — Z992 Dependence on renal dialysis: Secondary | ICD-10-CM

## 2023-03-17 DIAGNOSIS — I501 Left ventricular failure: Secondary | ICD-10-CM | POA: Diagnosis not present

## 2023-03-17 DIAGNOSIS — C50919 Malignant neoplasm of unspecified site of unspecified female breast: Secondary | ICD-10-CM | POA: Diagnosis present

## 2023-03-17 DIAGNOSIS — C7951 Secondary malignant neoplasm of bone: Secondary | ICD-10-CM | POA: Diagnosis present

## 2023-03-17 DIAGNOSIS — J9601 Acute respiratory failure with hypoxia: Secondary | ICD-10-CM

## 2023-03-17 DIAGNOSIS — J101 Influenza due to other identified influenza virus with other respiratory manifestations: Secondary | ICD-10-CM

## 2023-03-17 DIAGNOSIS — I5023 Acute on chronic systolic (congestive) heart failure: Secondary | ICD-10-CM | POA: Insufficient documentation

## 2023-03-17 DIAGNOSIS — I5033 Acute on chronic diastolic (congestive) heart failure: Secondary | ICD-10-CM | POA: Diagnosis not present

## 2023-03-17 DIAGNOSIS — N186 End stage renal disease: Secondary | ICD-10-CM

## 2023-03-17 LAB — CBC WITH DIFFERENTIAL/PLATELET
Abs Immature Granulocytes: 0.01 10*3/uL (ref 0.00–0.07)
Basophils Absolute: 0 10*3/uL (ref 0.0–0.1)
Basophils Relative: 0 %
Eosinophils Absolute: 0 10*3/uL (ref 0.0–0.5)
Eosinophils Relative: 0 %
HCT: 29.3 % — ABNORMAL LOW (ref 39.0–52.0)
Hemoglobin: 9.6 g/dL — ABNORMAL LOW (ref 13.0–17.0)
Immature Granulocytes: 0 %
Lymphocytes Relative: 5 %
Lymphs Abs: 0.2 10*3/uL — ABNORMAL LOW (ref 0.7–4.0)
MCH: 31.6 pg (ref 26.0–34.0)
MCHC: 32.8 g/dL (ref 30.0–36.0)
MCV: 96.4 fL (ref 80.0–100.0)
Monocytes Absolute: 0.4 10*3/uL (ref 0.1–1.0)
Monocytes Relative: 8 %
Neutro Abs: 4 10*3/uL (ref 1.7–7.7)
Neutrophils Relative %: 87 %
Platelets: 66 10*3/uL — ABNORMAL LOW (ref 150–400)
RBC: 3.04 MIL/uL — ABNORMAL LOW (ref 4.22–5.81)
RDW: 15.5 % (ref 11.5–15.5)
WBC: 4.6 10*3/uL (ref 4.0–10.5)
nRBC: 0 % (ref 0.0–0.2)

## 2023-03-17 LAB — TROPONIN I (HIGH SENSITIVITY)
Troponin I (High Sensitivity): 36 ng/L — ABNORMAL HIGH (ref <18)
Troponin I (High Sensitivity): 63 ng/L — ABNORMAL HIGH (ref <18)
Troponin I (High Sensitivity): 69 ng/L — ABNORMAL HIGH (ref <18)
Troponin I (High Sensitivity): 72 ng/L — ABNORMAL HIGH (ref <18)

## 2023-03-17 LAB — BLOOD GAS, VENOUS
Acid-Base Excess: 8 mmol/L — ABNORMAL HIGH (ref 0.0–2.0)
Bicarbonate: 32 mmol/L — ABNORMAL HIGH (ref 20.0–28.0)
Drawn by: 8289
O2 Saturation: 94.5 %
Patient temperature: 37.8
pCO2, Ven: 42 mm[Hg] — ABNORMAL LOW (ref 44–60)
pH, Ven: 7.49 — ABNORMAL HIGH (ref 7.25–7.43)
pO2, Ven: 68 mm[Hg] — ABNORMAL HIGH (ref 32–45)

## 2023-03-17 LAB — HEPATIC FUNCTION PANEL
ALT: 24 U/L (ref 0–44)
AST: 33 U/L (ref 15–41)
Albumin: 3.8 g/dL (ref 3.5–5.0)
Alkaline Phosphatase: 69 U/L (ref 38–126)
Bilirubin, Direct: 0.2 mg/dL (ref 0.0–0.2)
Indirect Bilirubin: 0.7 mg/dL (ref 0.3–0.9)
Total Bilirubin: 0.9 mg/dL (ref 0.0–1.2)
Total Protein: 7.7 g/dL (ref 6.5–8.1)

## 2023-03-17 LAB — RENAL FUNCTION PANEL
Albumin: 3.9 g/dL (ref 3.5–5.0)
Anion gap: 16 — ABNORMAL HIGH (ref 5–15)
BUN: 20 mg/dL (ref 8–23)
CO2: 27 mmol/L (ref 22–32)
Calcium: 10.2 mg/dL (ref 8.9–10.3)
Chloride: 94 mmol/L — ABNORMAL LOW (ref 98–111)
Creatinine, Ser: 6.02 mg/dL — ABNORMAL HIGH (ref 0.61–1.24)
GFR, Estimated: 10 mL/min — ABNORMAL LOW (ref >60)
Glucose, Bld: 134 mg/dL — ABNORMAL HIGH (ref 70–99)
Phosphorus: 4.4 mg/dL (ref 2.5–4.6)
Potassium: 3.8 mmol/L (ref 3.5–5.1)
Sodium: 137 mmol/L (ref 135–145)

## 2023-03-17 LAB — GLUCOSE, CAPILLARY: Glucose-Capillary: 156 mg/dL — ABNORMAL HIGH (ref 70–99)

## 2023-03-17 LAB — HEPATITIS B SURFACE ANTIGEN: Hepatitis B Surface Ag: NONREACTIVE

## 2023-03-17 LAB — MAGNESIUM: Magnesium: 1.9 mg/dL (ref 1.7–2.4)

## 2023-03-17 MED ORDER — PREDNISONE 20 MG PO TABS
40.0000 mg | ORAL_TABLET | Freq: Every day | ORAL | Status: AC
  Administered 2023-03-18 – 2023-03-21 (×4): 40 mg via ORAL
  Filled 2023-03-17 (×4): qty 2

## 2023-03-17 MED ORDER — VANCOMYCIN HCL IN DEXTROSE 1-5 GM/200ML-% IV SOLN: 1000.0000 mg | INTRAVENOUS | Status: DC

## 2023-03-17 MED ORDER — CHLORHEXIDINE GLUCONATE CLOTH 2 % EX PADS
6.0000 | MEDICATED_PAD | Freq: Every day | CUTANEOUS | Status: DC
  Administered 2023-03-18 – 2023-03-20 (×3): 6 via TOPICAL

## 2023-03-17 MED ORDER — TRAMADOL HCL 50 MG PO TABS
100.0000 mg | ORAL_TABLET | Freq: Two times a day (BID) | ORAL | Status: DC | PRN
  Administered 2023-03-17: 100 mg via ORAL
  Filled 2023-03-17: qty 2

## 2023-03-17 MED ORDER — ALBUTEROL SULFATE (2.5 MG/3ML) 0.083% IN NEBU
2.5000 mg | INHALATION_SOLUTION | RESPIRATORY_TRACT | Status: DC | PRN
  Administered 2023-03-18: 2.5 mg via RESPIRATORY_TRACT
  Filled 2023-03-17: qty 3

## 2023-03-17 MED ORDER — METHYLPREDNISOLONE SODIUM SUCC 40 MG IJ SOLR
40.0000 mg | Freq: Once | INTRAMUSCULAR | Status: AC
  Administered 2023-03-17: 40 mg via INTRAVENOUS
  Filled 2023-03-17: qty 1

## 2023-03-17 MED ORDER — VANCOMYCIN HCL 2000 MG/400ML IV SOLN
2000.0000 mg | Freq: Once | INTRAVENOUS | Status: DC
  Administered 2023-03-17: 2000 mg via INTRAVENOUS
  Filled 2023-03-17: qty 400

## 2023-03-17 MED ORDER — IPRATROPIUM-ALBUTEROL 0.5-2.5 (3) MG/3ML IN SOLN
3.0000 mL | Freq: Four times a day (QID) | RESPIRATORY_TRACT | Status: DC
  Administered 2023-03-17 – 2023-03-20 (×8): 3 mL via RESPIRATORY_TRACT
  Filled 2023-03-17 (×11): qty 3

## 2023-03-17 NOTE — Progress Notes (Signed)
   03/17/23 0515  BiPAP/CPAP/SIPAP  $ Non-Invasive Home Ventilator  Subsequent  $ Face Mask Small Yes  BiPAP/CPAP/SIPAP Pt Type Adult  BiPAP/CPAP/SIPAP DREAMSTATIOND  Mask Type Full face mask  Mask Size Small  PEEP 6 cmH20  Flow Rate 5 lpm  CPAP/SIPAP surface wiped down Yes  BiPAP/CPAP /SiPAP Vitals  Pulse Rate 94  SpO2 96 %  MEWS Score/Color  MEWS Score 0  MEWS Score Color [place] Si

## 2023-03-17 NOTE — Plan of Care (Addendum)
 FMTS Interim Progress Note  S: Went to assess patient at bedside after HD session And still feels slightly dyspneic on his CPAP machine currently States that he still has some left-sided chest pain and this did not improve with hemodialysis Does not feel like his shortness of breath improved with HD  O: BP (!) 154/90 (BP Location: Right Arm)   Pulse 92   Temp 100 F (37.8 C) (Oral)   Resp 20   Ht 5\' 7"  (1.702 m)   Wt 88 kg   SpO2 97%   BMI 30.39 kg/m    General: Ill-appearing but nontoxic, awake but drowsy, responsive to questions Head: Normocephalic atraumatic CV: Regular rate and rhythm no murmurs rubs or gallops Respiratory: Mild increased work of breathing on CPAP, coarse crackles throughout anterior lung fields, chest rises symmetrically (improved from my prior exam), mild tenderness to palpation over left chest wall - no crepitus Abdomen: Soft, non-tender, non-distended Extremities: Moves upper and lower extremities freely, no edema in LE  A/P: Acute hypoxic respiratory failure Still some increased work of breathing on CPAP-will reach out to see if respiratory needs to adjust mask or consider BiPAP if not improving -VBG  Elevated Troponin  Chest Pain Called and spoke with on call cardiology fellow Dr. Havery Moros about repeat EKG, slight increase in troponin still intermittent left sided chest pain Upon review with her, appears T waves are elevated but does not appear to be ST elevation/ischemia based on her read. On previous EKG appeared to have J point in V3 some baseline T wave abnormalities Troponin 20s-30s in ESRD not quite remarkable  -Repeat troponin  Levin Erp, MD 03/17/2023, 5:33 AM PGY-3, [facility] pager [phone]

## 2023-03-17 NOTE — Plan of Care (Signed)
 FMTS Interim Progress Note  S: Still feeling short of breath, though his CPAP is helping.  O: BP (!) 144/64 (BP Location: Right Arm)   Pulse 97   Temp 98.9 F (37.2 C) (Oral)   Resp 20   Ht 5\' 7"  (1.702 m)   Wt 84.4 kg   SpO2 96%   BMI 29.14 kg/m   General: Lying in bed, no acute distress, conversant Cardiac: Regular rate and rhythm with 3/6 systolic murmur Pulmonary: Coarse breath sounds bilaterally anteriorly Abdominal: Soft, nontender, nondistended Extremities: Moves all grossly equally  A/P: AHRF in setting of influenza, hypervolemia with ESRD on HD Stable.  Has been getting HD with adequate fluid removal.  HD again tomorrow.  Has been getting steroids, Tamiflu, bronchodilators with slow improvement.  Currently off antibiotics.  Continue scheduled bronchodilators and CPAP.  Heart failure with preserved ejection fraction Recent LVEF in 2024 was 50 to 55%.  Repeat echo pending.  Continuing home Imdur, metoprolol.  Remainder per day team.  Evette Georges, MD 03/17/2023, 7:39 PM PGY-2, [facility] Service pager [phone]

## 2023-03-17 NOTE — Assessment & Plan Note (Addendum)
 New oxygen requirement, multifactorial given recurrent PNA, CTPE negative for clots but +bronchiolitis (Flu+), hypervolemia (4L removed in HD session) some elevated troponin but EKG not ischemic appearing after discussion with cardiology (also has some chest wall tenderness). -Cefepime, azithromycin (Consider d/c vancomycin given negative MRSA swab) -Tamiflu (discussed below) -Solumedrol 40 x 1, prednisone 40 tomorrow -DuoNebs every 4 hour -RT to assess, consider BiPAP for work of breathing vs. adjust CPAP settings -repeat troponin -VBG -Supportive care: Tylenol, Mucinex -NPO pending SLP eval -Blood cultures

## 2023-03-17 NOTE — Plan of Care (Addendum)
 Briefly checked in on patient in HD. Sleeping comfortably with CPAP on - vitals on tele appropriate-no obvious ST changes on tele, mildly hypertensive. Bilateral chest rise.

## 2023-03-17 NOTE — Progress Notes (Signed)
 PT Cancellation Note  Patient Details Name: Kirk [name] MRN: [id number] DOB: Apr 14, 1960   Cancelled Treatment:    Reason Eval/Treat Not Completed: Patient not medically ready (Pt on cpap, not feeling well). Will check back tomorrow.   Lyanne Co, PT  Acute Rehab Services Secure chat preferred Office [phone]    Lawana Chambers Floy Riegler 03/17/2023, 10:14 AM

## 2023-03-17 NOTE — Assessment & Plan Note (Signed)
 EF 50 to 55% in 12/2022.  Volume management as above. -Echo -Continue home meds Imdur, Metoprolol

## 2023-03-17 NOTE — Assessment & Plan Note (Signed)
 Chronic, did have drop in plts to 56K yesterday.  Likely in the setting of multiple comorbidities per Heme/Onc. -Trend CBC

## 2023-03-17 NOTE — Assessment & Plan Note (Signed)
-  Tamiflu 30 mg x 1, with dialysis x 4 doses

## 2023-03-17 NOTE — Evaluation (Signed)
 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)    Anxiety    Asthma    Asthma    moderate persistent   Carpal tunnel syndrome    Cellulitis and abscess of trunk    Cholelithiasis 07/13/2014   Chronic headaches    Cigarette smoker 07/11/2017   Stopped 2/022  - referred to start Lung cancer screening 04/2021         Debility, unspecified    Dermatophytosis of the body    Dysrhythmia    history of   Edema    End stage renal disease on dialysis (HCC)    "MWF; [facility] in [place]" (10/21/2014)   Essential hypertension, benign    Generalized anxiety disorder 05/25/2022   GERD (gastroesophageal reflux disease)    Gout, unspecified    HTN (hypertension)    Hypertrophy of prostate without urinary obstruction and other lower urinary tract symptoms (LUTS)    Hypotension, unspecified    Impotence of organic origin    Insomnia, unspecified    Kidney replaced by transplant    Localization-related (focal) (partial) epilepsy and epileptic syndromes with complex partial seizures, without mention of intractable epilepsy    12-15-19- Wife states he has NEVER had a seizure    Lumbago    Memory loss    OSA on CPAP    Other and unspecified hyperlipidemia    controlled /managed per wife    Other chronic nonalcoholic liver disease    Other malaise and fatigue    Other nonspecific abnormal serum enzyme levels    Pain in joint, lower leg    Pain in joint, upper arm    Pneumonia "several times"   PONV (postoperative nausea and vomiting)    Renal dialysis  status(V45.11) 02/05/2010   restarted 01/02/13 ofter renal trransplant failure   Secondary hyperparathyroidism (of renal origin)    Shortness of breath    Sleep apnea    wears cpap    Tension headache    Unspecified constipation    Unspecified essential hypertension    Unspecified hereditary and idiopathic peripheral neuropathy    Unspecified vitamin D deficiency    Past Surgical History:  Past Surgical History:  Procedure Laterality Date   AV FISTULA PLACEMENT Left ?2010   "forearm; at [facility]"   BACK SURGERY     CARDIAC CATHETERIZATION  03/21/2011   CHOLECYSTECTOMY N/A 10/21/2014   Procedure: LAPAROSCOPIC CHOLECYSTECTOMY WITH INTRAOPERATIVE CHOLANGIOGRAM;  Surgeon: Chevis Pretty III, MD;  Location: [facility] OR;  Service: General;  Laterality: N/A;   COLONOSCOPY     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   IR ANGIO INTRA EXTRACRAN SEL INTERNAL CAROTID UNI R MOD SED  11/30/2021   IR ANGIOGRAM FOLLOW UP STUDY  11/30/2021   IR AV DIALY SHUNT INTRO NEEDLE/INTRACATH INITIAL W/PTA/IMG LEFT  12/13/2022   IR BONE MARROW BIOPSY & ASPIRATION  03/27/2022   IR NEURO EACH ADD'L AFTER BASIC UNI RIGHT (MS)  11/30/2021   IR TRANSCATH/EMBOLIZ  11/30/2021   IR US GUIDE VASC ACCESS LEFT  12/13/2022   KIDNEY TRANSPLANT  08/17/2011   [facility]    LAPAROSCOPIC CHOLECYSTECTOMY  10/21/2014   w/IOC   LEFT HEART CATHETERIZATION WITH CORONARY ANGIOGRAM N/A 03/21/2011   Procedure: LEFT HEART CATHETERIZATION WITH CORONARY ANGIOGRAM;  Surgeon: Chrystie Nose, MD;  Location: [facility] CATH LAB;  Service: Cardiovascular;  Laterality: N/A;   NEPHRECTOMY  08/2013   removed transplaned kidney   POLYPECTOMY     POSTERIOR FUSION CERVICAL SPINE  06/25/2012   for spinal stenosis   RADIOLOGY WITH ANESTHESIA N/A 11/30/2021   Procedure: RIGHT MIDDLE MENINGEAL ARTERY EMBOLIZATION;  Surgeon: Radiologist, Medication, MD;  Location: [facility] OR;  Service: Radiology;  Laterality: N/A;   VASECTOMY  2010   HPI:  Patient  is a 63 y.o. male with PMH: ESRD on HD (MWF), Alport syndrome s/p kidney transplant, metastatic breast cancer, chronic pancytopenia, recurrent pneumonia, OSA on CPAP, CAD, mild persistent asthma, systolic heart failure, GI bleed, subdural hematoma, HTN and DM-2. He presented to the hospital on 03/16/23 with acute hypoxic respiratory failure. He was placed on BiPAP morning of 2/23 due to mild increase in WOB/SOB then transitioned to CPAP as needed. He was made NPO awaiting SLP swallow evaluation secondary to concern for aspiration.    Assessment / Plan / Recommendation  Clinical Impression  Patient is presenting with clinical s/s of dysphagia as per this bedside swallow evaluation. He reported that he has been having some difficulty with swallowing for the past month and states "it goes down the wrong way." When SLP asked some questions to clarify, he did indicate that swallowing difficulty seems to be with solids rather than liquids. SLP assessed patient's swallow with sips of thin liquids (water) via cup, puree/pudding solids and regular solids. Swallow initiation appeared timely with cup sips of thin liquids and no immediate or delayed coughing observed. Patient grimaced slightly with first bite of pudding but subsequent bites were tolerated without overt s/s. After masticating 1/4 of a graham cracker, patient with slightly delayed cough leading to him saying he didn't want to try any more and handing back the cracker to SLP. SLP recommending initiate PO diet of full liquids/thin consistency. PO's only to be given when patient not on BiPAP or CPAP. SLP will follow for toleration and to determine if need for objective swallow study (MBS). SLP Visit Diagnosis: Dysphagia, unspecified (R13.10)    Aspiration Risk  Mild aspiration risk    Diet Recommendation Thin liquid;Other (Comment) (full liquids)    Liquid Administration via: Cup;Straw Medication Administration: Whole meds with liquid Supervision:  Patient able to self feed Compensations: Slow rate;Small sips/bites Postural Changes: Seated upright at 90 degrees    Other  Recommendations Oral Care Recommendations: Oral care BID    Recommendations for follow up therapy are one component of a multi-disciplinary discharge planning process, led by the attending physician.  Recommendations may be updated based on patient status, additional functional criteria and insurance authorization.  Follow up Recommendations Other (comment) (TBD)      Assistance Recommended at Discharge    Functional Status Assessment Patient has had a recent decline in their functional status and demonstrates the ability to make significant improvements in function in a reasonable and predictable amount of time.  Frequency and Duration min 2x/week  1 week       Prognosis Prognosis for improved oropharyngeal function: Good      Swallow Study   General Date of Onset: 03/16/23 HPI: Patient is a 63 y.o. male with PMH: ESRD on HD (  MWF), Alport syndrome s/p kidney transplant, metastatic breast cancer, chronic pancytopenia, recurrent pneumonia, OSA on CPAP, CAD, mild persistent asthma, systolic heart failure, GI bleed, subdural hematoma, HTN and DM-2. He presented to the hospital on 03/16/23 with acute hypoxic respiratory failure. He was placed on BiPAP morning of 2/23 due to mild increase in WOB/SOB then transitioned to CPAP as needed. He was made NPO awaiting SLP swallow evaluation secondary to concern for aspiration. Type of Study: Bedside Swallow Evaluation Previous Swallow Assessment: none found Diet Prior to this Study: NPO Temperature Spikes Noted: Yes (99.6) Respiratory Status: Nasal cannula History of Recent Intubation: No Behavior/Cognition: Alert;Cooperative;Lethargic/Drowsy Oral Cavity Assessment: Within Functional Limits Oral Care Completed by SLP: No Oral Cavity - Dentition: Edentulous;Other (Comment) (patient reports his dentures do not fit properly  so he does not wear them) Vision: Functional for self-feeding Self-Feeding Abilities: Able to feed self Patient Positioning: Upright in bed Baseline Vocal Quality: Normal Volitional Cough: Congested;Strong Volitional Swallow: Able to elicit    Oral/Motor/Sensory Function Overall Oral Motor/Sensory Function: Within functional limits   Ice Chips     Thin Liquid Thin Liquid: Within functional limits Presentation: Self Fed;Cup    Nectar Thick     Honey Thick     Puree Puree: Impaired Presentation: Spoon Pharyngeal Phase Impairments: Other (comments) Other Comments: patient grimaced after first bite only, indicating it was a little difficulty to swallow   Solid     Solid: Impaired Pharyngeal Phase Impairments: Cough - Delayed     Angela Nevin, MA, CCC-SLP Speech Therapy

## 2023-03-17 NOTE — Progress Notes (Signed)
 Patient placed on BIPap due to mild increase in WOB/SOB. 12/6 6L O2 bleed in used. RR 30, HR 90, sats 97%. VBG pending. MD notified.

## 2023-03-17 NOTE — Progress Notes (Addendum)
 [facility] Progress Note  Subjective:  4 L UF overnight w/ HD RR dropped near end of HD to 20 This am is saying he can't breathe, wrestling w/ the bipap mask Floor RN helped him to get it back on  Vitals:   03/17/23 0315 03/17/23 0450 03/17/23 0500 03/17/23 0515  BP: (!) 181/68 (!) 154/90    Pulse: 82 92  94  Resp: (!) 30 20    Temp: 99.6 F (37.6 C) 100 F (37.8 C)    TempSrc: Oral Oral    SpO2: 97%   96%  Weight:   84.4 kg   Height:        Exam: Gen alert, no distress No rash, cyanosis or gangrene Sclera anicteric, throat clear  No jvd or bruits Chest clear bilat to bases, no rales/ wheezing RRR no MRG Abd soft ntnd no mass or ascites +bs GU defer MS no joint effusions or deformity Ext no LE or UE edema, no other edema Neuro is alert, Ox 3 , nf    LUA AVF+bruit           Renal-related home meds: - norvasc 10 - cardura 8 mg hs - hydralazine 100 tid - metoprolol 100 bid - renavite - renvela 1600 ac tid - velphoro 1 gm ac tid      OP HD: MWF NW 4h  B450   90kg  2K bath  L AVF  Heparin 2500 - to follow  CXR 2/22 - vascular congestion bilat  CTA chest 2/22 -      Assessment/ Plan: SOB / Acute hypoxic resp failure/ volume - CXR w/ congestion, poss early IS edema, pt was having ^wob. We did HD last HD w/ 4 L removed. RR improved to 20/ min. This morning is in distress again, back on bipap. Not sure if there would still be a fluid component or not. CT chest doesn't suggest fluid overload, could be related to viral infection/ bronchitis/ etc.   Plan is to lower vol further w/ HD tomorrow as BP tolerates.  Acute flu A infection - getting steroids, tamiflu, nebs per pmd.  ESRD - on HD MWF. Had HD here 2/22 overnight as above. Next HD Monday.   HTN - BP's high, improved w/ HD and home meds.  Anemia of esrd - Hb 8.6 here, no esa needs tonight.  Secondary hyperparathyroidism - Ca in range, phos/ alb pending today. Cont binders Alport's  syndrome     Vinson Moselle MD  [facility] 03/17/2023, 8:14 AM  Recent Labs  Lab 03/16/23 1152 03/16/23 1202 03/17/23 0726  HGB 8.6* 8.6* 9.6*  CALCIUM 9.6  --   --   CREATININE 7.69*  --   --   K 3.6  --   --    No results for input(s): "IRON", "TIBC", "FERRITIN" in the last 168 hours. Inpatient medications:  amLODipine  10 mg Oral Daily   aspirin  81 mg Oral Daily   Chlorhexidine Gluconate Cloth  6 each Topical Q0600   doxazosin  8 mg Oral QHS   DULoxetine  60 mg Oral Daily   guaiFENesin  600 mg Oral BID   heparin  5,000 Units Subcutaneous Q8H   heparin sodium (porcine)       hydrALAZINE  100 mg Oral TID   ipratropium-albuterol  3 mL Nebulization Q4H   isosorbide mononitrate  60 mg Oral Daily   methylPREDNISolone (SOLU-MEDROL) injection  40 mg Intravenous Once   metoprolol tartrate  100  mg Oral BID   multivitamin  1 tablet Oral Daily   oseltamivir  30 mg Oral Q M,W,F   pravastatin  40 mg Oral Daily   [START ON 03/18/2023] predniSONE  40 mg Oral Q breakfast   sevelamer carbonate  3,200 mg Oral TID WC   sucroferric oxyhydroxide  1,000 mg Oral TID    azithromycin Stopped (03/16/23 2215)   ceFEPime (MAXIPIME) IV     [START ON 03/18/2023] vancomycin     vancomycin     acetaminophen, guaiFENesin, heparin sodium (porcine)

## 2023-03-17 NOTE — Progress Notes (Addendum)
 Daily Progress Note Intern Pager: [phone]  Patient name: Kirk [name] Medical record number: [id number] Date of birth: 1960/02/06 Age: 63 y.o. Gender: male  Primary Care Provider: Caesar Bookman, NP Consultants: Nephrology Code Status: Full code  Pt Overview and Major Events to Date:  2/23 admitted, HD session  Assessment and Plan:  Kirk [name] is a 63 year old male PMH ESRD on HD (MWF), Alport syndrome s/p kidney transplant, metastatic breast cancer, chronic pancytopenia, recurrent pneumonia, OSA on CPAP, CAD, mild persistent asthma, systolic heart failure, GI bleed, subdural hematoma, HTN and T2DM admitted for acute hypoxic respiratory failure. Assessment & Plan Acute hypoxic respiratory failure (HCC) New oxygen requirement, multifactorial given recurrent PNA, CTPE negative for clots but +bronchiolitis (Flu+), hypervolemia (4L removed in HD session) some elevated troponin but EKG not ischemic appearing after discussion with cardiology (also has some chest wall tenderness). -Cefepime, azithromycin (Consider d/c vancomycin given negative MRSA swab) -Tamiflu (discussed below) -Solumedrol 40 x 1, prednisone 40 tomorrow -DuoNebs every 4 hour -RT to assess, consider BiPAP for work of breathing vs. adjust CPAP settings -repeat troponin -VBG -Supportive care: Tylenol, Mucinex -NPO pending SLP eval -Blood cultures ESRD (end stage renal disease) on dialysis (HCC) MWF. Last outpatient HD with only 1 L off. Last night got 4L off. 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 -Nephrology following, appreciate care -Continue home Phos binders (HFpEF) heart failure with preserved ejection fraction (HCC) EF 50 to 55% in 12/2022.  Volume management as above. -Echo -Continue home meds Imdur, Metoprolol Influenza A -Tamiflu 30 mg x 1, with dialysis x 4 doses Pancytopenia (HCC) Chronic, did have drop in plts to 56K yesterday.   Likely in the setting of multiple comorbidities per Heme/Onc. -Trend CBC  Chronic and Stable Issues: HTN: Continue amlodipine 10 mg daily, hydralazine 100 mg 3 times daily T2DM: Last A1c 5.2 in 01/2023.  Diet controlled.  Daily fasting CBG Insomnia: Continue doxazosin 8 mg nightly Depression: Continue duloxetine 60 mg daily CAD: Continue ASA 81 mg HLD: Continue pravastatin 40 mg daily OSA: CPAP nightly  FEN/GI: NPO pending SLP eval PPx: SQ Heparin  Dispo:Pending clinical improvement (on O2)  Subjective:  Patient seen after HD session.  States that he still feels short of breath.  Says that he still has intermittent left-sided chest pain.  States that it has not improved with HD.  States that his CPAP is not making him feel like he cannot breathe very well.  Objective: Temp:  [98.3 F (36.8 C)-101.3 F (38.5 C)] 100 F (37.8 C) (02/23 0450) Pulse Rate:  [63-92] 92 (02/23 0450) Resp:  [18-36] 20 (02/23 0450) BP: (154-192)/(66-90) 154/90 (02/23 0450) SpO2:  [90 %-97 %] 97 % (02/23 0315) Weight:  [84.4 kg-88 kg] 84.4 kg (02/23 0500) Physical Exam: General: Ill-appearing but nontoxic, awake but drowsy, responsive to questions Head: Normocephalic atraumatic CV: Regular rate and rhythm no murmurs rubs or gallops Respiratory: Mild increased work of breathing on CPAP, coarse crackles throughout anterior lung fields, chest rises symmetrically (improved from my prior exam), left chest wall tender to palpation, no crepitus/overlying erythema or fluctuance Abdomen: Soft, non-tender, non-distended Extremities: Moves upper and lower extremities freely, no edema in LE  Laboratory: Most recent CBC Lab Results  Component Value Date   WBC 3.4 (L) 03/16/2023   HGB 8.6 (L) 03/16/2023   HCT 26.4 (L) 03/16/2023   MCV 98.5 03/16/2023   PLT 54 (L) 03/16/2023   Most  recent BMP    Latest Ref Rng & Units 03/16/2023   11:52 AM  BMP  Glucose 70 - 99 mg/dL 161   BUN 8 - 23 mg/dL 26   Creatinine  0.96 - 1.24 mg/dL 0.45   Sodium 409 - 811 mmol/L 138   Potassium 3.5 - 5.1 mmol/L 3.6   Chloride 98 - 111 mmol/L 94   CO2 22 - 32 mmol/L 31   Calcium 8.9 - 10.3 mg/dL 9.6     Imaging/Diagnostic Tests: CT Angio Chest Pulmonary Embolism (PE) W or WO Contrast Result Date: 03/16/2023 CLINICAL DATA:  Chest pain, shortness of breath EXAM: CT ANGIOGRAPHY CHEST WITH CONTRAST TECHNIQUE: Multidetector CT imaging of the chest was performed using the standard protocol during bolus administration of intravenous contrast. Multiplanar CT image reconstructions and MIPs were obtained to evaluate the vascular anatomy. RADIATION DOSE REDUCTION: This exam was performed according to the departmental dose-optimization program which includes automated exposure control, adjustment of the mA and/or kV according to patient size and/or use of iterative reconstruction technique. CONTRAST:  75mL OMNIPAQUE IOHEXOL 350 MG/ML SOLN COMPARISON:  X-ray 03/16/2023, CT 01/08/2023 FINDINGS: Cardiovascular: Satisfactory opacification of the pulmonary arteries to the segmental level. No evidence of pulmonary embolism. Thoracic aorta is nonaneurysmal. Atherosclerotic calcifications of the aorta and coronary arteries. Heart size is upper limits of normal. No pericardial effusion. Mediastinum/Nodes: Multiple mildly enlarged mediastinal and right hilar lymph nodes including 11 mm right paratracheal (series 5, image 37), 14 mm precarinal (series 5, image 56), lower right hilar node measuring 14 mm (series 5, image 63). No significant change in the degree of lymphadenopathy compared to the prior study. No axillary lymphadenopathy. Thyroid gland, trachea, and esophagus within normal limits. Lungs/Pleura: Bronchial wall thickening with multifocal tree-in-bud opacities most pronounced within the bilateral lower lobes. Trace right pleural effusion. No pneumothorax. Upper Abdomen: No acute abnormality. Musculoskeletal: No chest wall abnormality. No acute  or significant osseous findings. Review of the MIP images confirms the above findings. IMPRESSION: 1. No evidence of pulmonary embolism. 2. Bronchial wall thickening with multifocal tree-in-bud opacities most pronounced within the bilateral lower lobes, suggestive of an infectious or inflammatory bronchiolitis. 3. Trace right pleural effusion. 4. Multiple mildly enlarged mediastinal and right hilar lymph nodes, not significantly changed from the prior study. Findings are nonspecific and may be reactive. 5. Aortic and coronary artery atherosclerosis (ICD10-I70.0). Electronically Signed   By: Duanne Guess D.O.   On: 03/16/2023 20:45   Levin Erp, MD 03/17/2023, 5:43 AM  PGY-3, [facility] FPTS Intern pager: [phone], text pages welcome Secure chat group [facility] Teaching Service

## 2023-03-17 NOTE — Assessment & Plan Note (Signed)
 MWF. Last outpatient HD with only 1 L off. Last night got 4L off. 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 -Nephrology following, appreciate care -Continue home Phos binders

## 2023-03-17 NOTE — Plan of Care (Signed)
  Problem: Education: Goal: Knowledge of General Education information will improve Description Including pain rating scale, medication(s)/side effects and non-pharmacologic comfort measures Outcome: Progressing   Problem: Health Behavior/Discharge Planning: Goal: Ability to manage health-related needs will improve Outcome: Progressing   Problem: Clinical Measurements: Goal: Respiratory complications will improve Outcome: Progressing   Problem: Activity: Goal: Risk for activity intolerance will decrease Outcome: Progressing   Problem: Coping: Goal: Level of anxiety will decrease Outcome: Progressing   Problem: Safety: Goal: Ability to remain free from injury will improve Outcome: Progressing

## 2023-03-18 ENCOUNTER — Inpatient Hospital Stay (HOSPITAL_COMMUNITY): Payer: Medicare Other

## 2023-03-18 DIAGNOSIS — I2489 Other forms of acute ischemic heart disease: Secondary | ICD-10-CM | POA: Diagnosis not present

## 2023-03-18 DIAGNOSIS — R0609 Other forms of dyspnea: Secondary | ICD-10-CM | POA: Diagnosis not present

## 2023-03-18 DIAGNOSIS — D701 Agranulocytosis secondary to cancer chemotherapy: Secondary | ICD-10-CM

## 2023-03-18 DIAGNOSIS — J09X1 Influenza due to identified novel influenza A virus with pneumonia: Secondary | ICD-10-CM | POA: Diagnosis not present

## 2023-03-18 DIAGNOSIS — J9601 Acute respiratory failure with hypoxia: Secondary | ICD-10-CM | POA: Diagnosis not present

## 2023-03-18 DIAGNOSIS — D709 Neutropenia, unspecified: Secondary | ICD-10-CM | POA: Diagnosis not present

## 2023-03-18 DIAGNOSIS — N186 End stage renal disease: Secondary | ICD-10-CM | POA: Diagnosis not present

## 2023-03-18 LAB — CBC WITH DIFFERENTIAL/PLATELET
Abs Immature Granulocytes: 0.01 10*3/uL (ref 0.00–0.07)
Abs Immature Granulocytes: 0.01 10*3/uL (ref 0.00–0.07)
Basophils Absolute: 0 10*3/uL (ref 0.0–0.1)
Basophils Absolute: 0 10*3/uL (ref 0.0–0.1)
Basophils Relative: 1 %
Basophils Relative: 1 %
Eosinophils Absolute: 0 10*3/uL (ref 0.0–0.5)
Eosinophils Absolute: 0 10*3/uL (ref 0.0–0.5)
Eosinophils Relative: 0 %
Eosinophils Relative: 0 %
HCT: 24.8 % — ABNORMAL LOW (ref 39.0–52.0)
HCT: 25.1 % — ABNORMAL LOW (ref 39.0–52.0)
Hemoglobin: 7.9 g/dL — ABNORMAL LOW (ref 13.0–17.0)
Hemoglobin: 8.2 g/dL — ABNORMAL LOW (ref 13.0–17.0)
Immature Granulocytes: 1 %
Immature Granulocytes: 1 %
Lymphocytes Relative: 16 %
Lymphocytes Relative: 12 %
Lymphs Abs: 0.4 10*3/uL — ABNORMAL LOW (ref 0.7–4.0)
Lymphs Abs: 0.2 10*3/uL — ABNORMAL LOW (ref 0.7–4.0)
MCH: 31 pg (ref 26.0–34.0)
MCH: 31.4 pg (ref 26.0–34.0)
MCHC: 31.9 g/dL (ref 30.0–36.0)
MCHC: 32.7 g/dL (ref 30.0–36.0)
MCV: 97.3 fL (ref 80.0–100.0)
MCV: 96.2 fL (ref 80.0–100.0)
Monocytes Absolute: 0.3 10*3/uL (ref 0.1–1.0)
Monocytes Absolute: 0.1 10*3/uL (ref 0.1–1.0)
Monocytes Relative: 14 %
Monocytes Relative: 7 %
Neutro Abs: 1.5 10*3/uL — ABNORMAL LOW (ref 1.7–7.7)
Neutro Abs: 1.4 10*3/uL — ABNORMAL LOW (ref 1.7–7.7)
Neutrophils Relative %: 68 %
Neutrophils Relative %: 79 %
Platelets: 62 10*3/uL — ABNORMAL LOW (ref 150–400)
Platelets: 61 10*3/uL — ABNORMAL LOW (ref 150–400)
RBC: 2.55 MIL/uL — ABNORMAL LOW (ref 4.22–5.81)
RBC: 2.61 MIL/uL — ABNORMAL LOW (ref 4.22–5.81)
RDW: 15.3 % (ref 11.5–15.5)
RDW: 15.3 % (ref 11.5–15.5)
WBC: 2.2 10*3/uL — ABNORMAL LOW (ref 4.0–10.5)
WBC: 1.8 10*3/uL — ABNORMAL LOW (ref 4.0–10.5)
nRBC: 0 % (ref 0.0–0.2)
nRBC: 0 % (ref 0.0–0.2)

## 2023-03-18 LAB — ECHOCARDIOGRAM COMPLETE
AR max vel: 1.31 cm2
AV Area VTI: 1.23 cm2
AV Area mean vel: 1.17 cm2
AV Mean grad: 18 mm[Hg]
AV Peak grad: 32 mm[Hg]
Ao pk vel: 2.83 m/s
Area-P 1/2: 3.03 cm2
Calc EF: 49.7 %
Height: 67 in
MV VTI: 1.45 cm2
S' Lateral: 5.3 cm
Single Plane A2C EF: 49.8 %
Single Plane A4C EF: 52.9 %
Weight: 2950.4 [oz_av]

## 2023-03-18 LAB — HEPATIC FUNCTION PANEL
ALT: 19 U/L (ref 0–44)
AST: 29 U/L (ref 15–41)
Albumin: 3 g/dL — ABNORMAL LOW (ref 3.5–5.0)
Alkaline Phosphatase: 51 U/L (ref 38–126)
Bilirubin, Direct: 0.1 mg/dL (ref 0.0–0.2)
Indirect Bilirubin: 0.8 mg/dL (ref 0.3–0.9)
Total Bilirubin: 0.9 mg/dL (ref 0.0–1.2)
Total Protein: 6.1 g/dL — ABNORMAL LOW (ref 6.5–8.1)

## 2023-03-18 LAB — TROPONIN I (HIGH SENSITIVITY)
Troponin I (High Sensitivity): 76 ng/L — ABNORMAL HIGH (ref <18)
Troponin I (High Sensitivity): 72 ng/L — ABNORMAL HIGH (ref <18)

## 2023-03-18 LAB — RENAL FUNCTION PANEL
Albumin: 3 g/dL — ABNORMAL LOW (ref 3.5–5.0)
Anion gap: 16 — ABNORMAL HIGH (ref 5–15)
BUN: 40 mg/dL — ABNORMAL HIGH (ref 8–23)
CO2: 25 mmol/L (ref 22–32)
Calcium: 9.6 mg/dL (ref 8.9–10.3)
Chloride: 94 mmol/L — ABNORMAL LOW (ref 98–111)
Creatinine, Ser: 8.44 mg/dL — ABNORMAL HIGH (ref 0.61–1.24)
GFR, Estimated: 7 mL/min — ABNORMAL LOW (ref >60)
Glucose, Bld: 112 mg/dL — ABNORMAL HIGH (ref 70–99)
Phosphorus: 5.4 mg/dL — ABNORMAL HIGH (ref 2.5–4.6)
Potassium: 3.9 mmol/L (ref 3.5–5.1)
Sodium: 135 mmol/L (ref 135–145)

## 2023-03-18 LAB — RESPIRATORY PANEL BY PCR

## 2023-03-18 LAB — MAGNESIUM: Magnesium: 2.2 mg/dL (ref 1.7–2.4)

## 2023-03-18 LAB — TECHNOLOGIST SMEAR REVIEW: Plt Morphology: DECREASED

## 2023-03-18 MED ORDER — OSELTAMIVIR PHOSPHATE 30 MG PO CAPS
30.0000 mg | ORAL_CAPSULE | ORAL | Status: DC
  Filled 2023-03-18: qty 1

## 2023-03-18 MED ORDER — SUCROFERRIC OXYHYDROXIDE 500 MG PO CHEW
1000.0000 mg | CHEWABLE_TABLET | Freq: Three times a day (TID) | ORAL | Status: DC
  Administered 2023-03-19 – 2023-03-23 (×11): 1000 mg via ORAL
  Filled 2023-03-18 (×13): qty 2

## 2023-03-18 NOTE — Assessment & Plan Note (Addendum)
 Continues to feel improved; currently on CPAP while sleeping and 6L via [place] when at rest (does not require O2 at home). Abx were discontinued yesterday given his clinical improvement. He will receive another HD session later today, will re-assess breathing when he returns.  -Currently off abx  -Tamiflu (discussed below) -Begin prednisone 40mg  (02/24-02/27)  -DuoNebs every 4 hour -Using CPAP with sleeping, on 6L [place]; wean O2 as tolerated  -Supportive care: Tylenol, Mucinex, pulm toilet, spirometry  -f/up bcx, NGTD -Repeat CXR today

## 2023-03-18 NOTE — Assessment & Plan Note (Addendum)
-  Tamiflu 30 mg on dialysis days (last dose 3/3)

## 2023-03-18 NOTE — Assessment & Plan Note (Addendum)
 MWF. Last outpatient HD with only 1 L off, pulled 4L off on evening of 02/22 while admitted. 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 -Nephrology following, appreciate recs -Continue home Phos binders -HD today  -monitor RFP, trend weights (EDW: 88.6kg)

## 2023-03-18 NOTE — Assessment & Plan Note (Addendum)
 Chronic, likely in the setting of multiple comorbidities per Heme/Onc. Platelets low but stable on repeat labs. New neutropenia on AM labs. Last Neulasta injection 01/16. Given overall presentation, his neutropenia is likely due to this infectious process but concern for neutropenic fever given borderline high temperatures on tylenol.  -Recheck CBC w/ diff, consider discussing case further with heme/onc, will consider neutropenic precautions  -Trend CBC w/ diff  -Recheck Hgb, was down to 7.9 this AM -Transfusion threshold >8 hgb

## 2023-03-18 NOTE — Progress Notes (Addendum)
 Daily Progress Note Intern Pager: [phone]  Patient name: Kirk [name] Medical record number: [id number] Date of birth: 1960/04/03 Age: 63 y.o. Gender: male  Primary Care Provider: Caesar Bookman, NP Consultants: Nephro Code Status: Full   Pt Overview and Major Events to Date:  02/22 admitted, HD session (late evening)  02/24: echo performed, HD session   Assessment and Plan:  Kirk [name] is a 63 year old male PMH ESRD on HD (MWF), Alport syndrome s/p kidney transplant, metastatic breast cancer (currently on [place]), chronic pancytopenia, recurrent pneumonia, OSA on CPAP, CAD, mild persistent asthma, systolic heart failure, GI bleed, subdural hematoma, HTN and T2DM admitted for acute hypoxic respiratory failure iso Flu A infection.  Assessment & Plan Acute hypoxic respiratory failure (HCC) Continues to feel improved; currently on CPAP while sleeping and 6L via [place] when at rest (does not require O2 at home). Abx were discontinued yesterday given his clinical improvement. He will receive another HD session later today, will re-assess breathing when he returns.  -Currently off abx  -Tamiflu (discussed below) -Begin prednisone 40mg  (02/24-02/27)  -DuoNebs every 4 hour -Using CPAP with sleeping, on 6L [place]; wean O2 as tolerated  -Supportive care: Tylenol, Mucinex, pulm toilet, spirometry  -f/up bcx, NGTD -Repeat CXR today   Influenza A lower respiratory tract infection -Tamiflu 30 mg on dialysis days (last dose 3/3)  ESRD (end stage renal disease) on dialysis (HCC) MWF. Last outpatient HD with only 1 L off, pulled 4L off on evening of 02/22 while admitted. 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 -Nephrology following, appreciate recs -Continue home Phos binders -HD today  -monitor RFP, trend weights (EDW: 88.6kg)  (HFpEF) heart failure with preserved ejection fraction (HCC) EF 50 to 55% in 12/2022.  Volume  management as above. -Echo today  -Continue home meds Imdur, Metoprolol  Pancytopenia (HCC) Chronic, likely in the setting of multiple comorbidities per Heme/Onc. Platelets low but stable on repeat labs. New neutropenia on AM labs. Last Neulasta injection 01/16. Given overall presentation, his neutropenia is likely due to this infectious process but concern for neutropenic fever given borderline high temperatures on tylenol.  -Recheck CBC w/ diff, consider discussing case further with heme/onc, will consider neutropenic precautions  -Trend CBC w/ diff  -Recheck Hgb, was down to 7.9 this AM -Transfusion threshold >8 hgb  Demand ischemia (HCC) (Resolved: 03/18/2023) Trops elevated on admission; have hit plateau and are now decreasing. Repeat EKG without acute abnormality. Likely secondary to hypoxia given overall presentation.  -Stop checking trops   Chronic and Stable Issues: HTN: Continue amlodipine 10 mg daily, hydralazine 100 mg 3 times daily T2DM: Last A1c 5.2 in 01/2023. Diet controlled. Daily fasting CBG Insomnia: Continue doxazosin 8 mg nightly Depression: Continue duloxetine 60 mg daily CAD: Continue ASA 81 mg HLD: Continue pravastatin 40 mg daily OSA: CPAP nightly  FEN/GI: Thin liquid  PPx: heparin  Dispo:Pending PT recommendations  and pending clinical improvement . Barriers include pending echo, need for clinical improvement (O2 requirement).   Subjective:  NAEON. He is without complaint this morning and resting in bed on CPAP. No chest pain. Breathing has continued to improve from his standpoint though still short of breath when not on CPAP or [place]. No abdominal pain, extremity pain.    Objective: Temp:  [98.3 F (36.8 C)-99.6 F (37.6 C)] 98.8 F (37.1 C) (02/24 0511) Pulse Rate:  [58-97] 58 (02/24 0719) Resp:  [18-22] 18 (02/24 0719)  BP: (117-179)/(47-80) 135/65 (02/24 0719) SpO2:  [93 %-97 %] 96 % (02/24 0745) FiO2 (%):  [30 %] 30 % (02/23 2013) Weight:  [83.6  kg-85.9 kg] 83.6 kg (02/24 0600)  Physical Exam: General: Resting comfortably in bed. NAD.  Cardiovascular: RRR. Systolic murmur. No rubs, gallops.  Respiratory: On CPAP. No increased work of breathing. Coarse breath sounds bilaterally across posterior lung fields. Bibasilar crackles present. No wheezes.  Abdomen: Soft, NTND.  Extremities: Warm. Trace edema bilaterally.   Laboratory: Most recent CBC Lab Results  Component Value Date   WBC 2.2 (L) 03/18/2023   HGB 7.9 (L) 03/18/2023   HCT 24.8 (L) 03/18/2023   MCV 97.3 03/18/2023   PLT 62 (L) 03/18/2023   Most recent BMP    Latest Ref Rng & Units 03/18/2023    2:38 AM  BMP  Glucose 70 - 99 mg/dL 161   BUN 8 - 23 mg/dL 40   Creatinine 0.96 - 1.24 mg/dL 0.45   Sodium 409 - 811 mmol/L 135   Potassium 3.5 - 5.1 mmol/L 3.9   Chloride 98 - 111 mmol/L 94   CO2 22 - 32 mmol/L 25   Calcium 8.9 - 10.3 mg/dL 9.6     Imaging/Diagnostic Tests: No new imaging in the last 24hr.   [name], Medical Student 03/18/2023, 11:13 AM [place] [facility] FPTS Intern pager: [phone], text pages welcome Secure chat group [facility] Teaching Service   FPTS Upper-Level Resident Addendum   I have independently interviewed and examined the patient. I have discussed the above with [facility] and agree with the documented plan. My edits for correction/addition/clarification are included above. Please see any attending notes.   Vonna Drafts, MD PGY-2, [facility] 03/18/2023 12:17 PM  FPTS Service pager: [phone] (text pages welcome through AMION)

## 2023-03-18 NOTE — TOC CM/SW Note (Signed)
 Transition of Care [facility]) - Inpatient Brief Assessment   Patient Details  Name: KAYLEB WARSHAW MRN: [id number] Date of Birth: [birth year]  Transition of Care [facility]) CM/SW Contact:    Leone Haven, RN Phone Number: 03/18/2023, 5:04 PM   Clinical Narrative: From home with spouse, has PCP and insurance on file, states has no HH services in place at this time, has cpap machine  at home.  States family member will transport them home at [employer] and family is support system, states gets medications from [facility] on [address].  Pta self ambulatory.    Transition of Care Asessment: Insurance and Status: Insurance coverage has been reviewed Patient has primary care physician: Yes Home environment has been reviewed: home with wife Prior level of function:: indep Prior/Current Home Services: Current home services (cpap machine) Social Drivers of Health Review: SDOH reviewed no interventions necessary Readmission risk has been reviewed: Yes Transition of care needs: no transition of care needs at this time

## 2023-03-18 NOTE — Progress Notes (Signed)
 Speech Language Pathology Treatment: Dysphagia  Patient Details Name: Kirk [name] MRN: [id number] DOB: 01-Oct-1960 Today's Date: 03/18/2023 Time: 8295-6213 SLP Time Calculation (min) (ACUTE ONLY): 13 min  Assessment / Plan / Recommendation Clinical Impression  F/u diet tolerance assessment complete at bedside. Patient on and off cpap, removing and replacing himself as needed. He was willing to remove for po trials with this SLP. Self fed sips of thin liquid and bites of pureed solids, both with facial grimacing during swallow and complaints of globus, greater with pureed solid vs thin liquid. He states that food (and at time liquid) "just wont go down." Suspect primary esophageal dysphagia as there appears to be appropriate oral management of bolus and no overt s/s of aspiration however given recurrent PNA and ongoing complaints impacting ability to consume adequate pos for nutritional support, recommend proceeding with MBS to evaluate swallowing physiology. Discussed with RN. Ability to transport to radiology for exam will be based on respiratory status. Will check back next date in am.    HPI HPI: Patient is a 63 y.o. male with PMH: ESRD on HD (MWF), Alport syndrome s/p kidney transplant, metastatic breast cancer, chronic pancytopenia, recurrent pneumonia, OSA on CPAP, CAD, mild persistent asthma, systolic heart failure, GI bleed, subdural hematoma, HTN and DM-2. He presented to the hospital on 03/16/23 with acute hypoxic respiratory failure. He was placed on BiPAP morning of 2/23 due to mild increase in WOB/SOB then transitioned to CPAP as needed. He was made NPO awaiting SLP swallow evaluation secondary to concern for aspiration.      SLP Plan  MBS      Recommendations for follow up therapy are one component of a multi-disciplinary discharge planning process, led by the attending physician.  Recommendations may be updated based on patient status, additional functional criteria and insurance  authorization.    Recommendations  Diet recommendations: Thin liquid (clear liquids) Liquids provided via: Cup;Straw Medication Administration: Whole meds with liquid Supervision: Patient able to self feed;Intermittent supervision to cue for compensatory strategies Compensations: Slow rate;Small sips/bites Postural Changes and/or Swallow Maneuvers: Seated upright 90 degrees;Upright 30-60 min after meal                  Oral care BID     Dysphagia, unspecified (R13.10)     MBS   Jannet Calip MA, CCC-SLP   Amantha Sklar Meryl  03/18/2023, 1:15 PM

## 2023-03-18 NOTE — Progress Notes (Signed)
 SLP Cancellation Note  Patient Details Name: Kirk [name] MRN: [id number] DOB: 11/21/1960   Cancelled treatment:       Reason Eval/Treat Not Completed: Other (comment) (Remains on Cpap).   [name] MA, CCC-SLP    [name] Meryl 03/18/2023, 11:36 AM

## 2023-03-18 NOTE — Assessment & Plan Note (Addendum)
 EF 50 to 55% in 12/2022.  Volume management as above. -Echo today  -Continue home meds Imdur, Metoprolol

## 2023-03-18 NOTE — Assessment & Plan Note (Addendum)
 Trops elevated on admission; have hit plateau and are now decreasing. Repeat EKG without acute abnormality. Likely secondary to hypoxia given overall presentation.  -Stop checking trops

## 2023-03-18 NOTE — Progress Notes (Signed)
 [facility] Progress Note  Subjective:  Last HD on 2/22 with 4 kg UF.  CTA without pulmonary embolism.  Per bedside RN he looks better today than yesterday.  Had been on 6 liters oxygen CPAP overnight.    Review of systems:  Some shortness of breath; no chest pain  Denies n/v    Vitals:   03/18/23 0600 03/18/23 0719 03/18/23 0742 03/18/23 0745  BP:  135/65    Pulse:  (!) 58    Resp:  18    Temp:      TempSrc:      SpO2:  95% 96% 96%  Weight: 83.6 kg     Height:        Physical Exam:  General adult male in bed in no acute distress HEENT normocephalic atraumatic extraocular movements intact sclera anicteric Neck supple trachea midline Lungs clear to auscultation bilaterally normal work of breathing at rest  Heart S1S2 no rub Abdomen soft nontender nondistended Extremities no lower extremity edema  Psych normal mood and affect Left AVF with bruit        Renal-related home meds: - norvasc 10 - cardura 8 mg hs - hydralazine 100 tid - metoprolol 100 bid - renavite - renvela 1600 ac tid - velphoro 1 gm ac tid      OP HD:  MWF  [name] EDW 88.6 kg  4h   B450  ml/min DF 700 ml/min  2K/2 Ca bath L AVF  Meds: mircera 150 mcg every 2 weeks (last given 2/19), venofer 50 mg weekly, calcitriol 1.75 mcg three times a week  CXR 2/22 - vascular congestion bilat  CTA chest     Assessment/ Plan: SOB / Acute hypoxic resp failure/ volume - CXR w/ congestion, poss early edema. S/p HD with 4 kg UF here with improvement on 2/22. Then on 2/23 AM was in distress again, back on bipap.  CT chest doesn't suggest fluid overload, could be related to viral infection/ bronchitis/ etc.    Supportive care for flu A per primary team  Optimize volume status with HD Acute flu A infection - getting steroids, tamiflu, nebs per primary team   ESRD - on HD MWF.   Will need to ensure that we update his EDW.   HTN - improved w/ HD and home meds.  Anemia of esrd - last  outpatient ESA on 2/19. PRBC's per primary team   Secondary hyperparathyroidism - hyperphos - on velphoro  Alport's syndrome - noted   Disposition - per primary team     Recent Labs  Lab 03/17/23 0726 03/17/23 0730 03/18/23 0236 03/18/23 0238  HGB 9.6*  --  7.9*  --   ALBUMIN 3.8 3.9 3.0* 3.0*  CALCIUM  --  10.2  --  9.6  PHOS  --  4.4  --  5.4*  CREATININE  --  6.02*  --  8.44*  K  --  3.8  --  3.9   No results for input(s): "IRON", "TIBC", "FERRITIN" in the last 168 hours. Inpatient medications:  amLODipine  10 mg Oral Daily   aspirin  81 mg Oral Daily   Chlorhexidine Gluconate Cloth  6 each Topical Q0600   Chlorhexidine Gluconate Cloth  6 each Topical Q0600   doxazosin  8 mg Oral QHS   DULoxetine  60 mg Oral Daily   guaiFENesin  600 mg Oral BID   heparin  5,000 Units Subcutaneous Q8H   hydrALAZINE  100 mg Oral TID  ipratropium-albuterol  3 mL Nebulization Q6H   isosorbide mononitrate  60 mg Oral Daily   metoprolol tartrate  100 mg Oral BID   multivitamin  1 tablet Oral Daily   oseltamivir  30 mg Oral Q M,W,F   pravastatin  40 mg Oral Daily   predniSONE  40 mg Oral Q breakfast   sevelamer carbonate  3,200 mg Oral TID WC   sucroferric oxyhydroxide  1,000 mg Oral TID     acetaminophen, albuterol, guaiFENesin, traMADol     Estanislado Emms, MD 9:54 AM 03/18/2023

## 2023-03-18 NOTE — Evaluation (Signed)
 Physical Therapy Evaluation Patient Details Name: Kirk [name] MRN: [id number] DOB: 1960/06/10 Today's Date: 03/18/2023  History of Present Illness  Pt is 63 year old presented to [facility] on  03/16/23 for SOB and feeling of being filled with fluid even after HD.  Pt with PNA , flu A, and hypoxic respiratory failure. PMH - ESRD on HD, failed kidney transplant, htn, anxiety, OSA, SDH, GI bleed, metastatic breast cancer, pancytopenia, seizure   Clinical Impression  Pt presents with condition above and deficits mentioned below, see PT Problem List. PTA, he was independent without DME, living with his wife and child in a 1-level house with 9-12 STE. Currently, the pt is demonstrating some mild generalized weakness and balance deficits. He initially was able to transfer to stand and ambulate out of his room into the hall with CGA for safety and no UE support. However, upon entering the hall the pt quickly became less talkative and his eyes became cloudy, appearing like he was about to pass out. He needed minA to quickly guide him back into his room and to sit on the EOB. He quickly recovered and began to act like himself again once sitting EOB. The pt would benefit from having a set of orthostatics ran. RN aware. His BP sitting EOB after he recovered was 139/56 (78). The pt's SpO2 did drop to as low as 85% on RA when ambulating but then recovered quickly to >/= 97% on 6L when sitting. The pt may benefit from follow-up with HHPT and a rollator for energy conservation and to provide him with a place to sit as needed if he gets dizzy upon d/c home. However, the pt may make quick progress and not need either by the time he discharges. Will continue to follow acutely to assess d/c needs.       If plan is discharge home, recommend the following: A little help with walking and/or transfers;A little help with bathing/dressing/bathroom;Assistance with cooking/housework;Assist for transportation;Help with stairs or ramp  for entrance   Can travel by private vehicle        Equipment Recommendations Rollator (4 wheels) (pending progress)  Recommendations for Other Services       Functional Status Assessment Patient has had a recent decline in their functional status and demonstrates the ability to make significant improvements in function in a reasonable and predictable amount of time.     Precautions / Restrictions Precautions Precautions: Fall Recall of Precautions/Restrictions: Intact Precaution/Restrictions Comments: watch BP and SpO2 Restrictions Weight Bearing Restrictions Per Provider Order: No      Mobility  Bed Mobility Overal bed mobility: Modified Independent             General bed mobility comments: Pt able to transition supine to sit L EOB with HOB elevated without assistance    Transfers Overall transfer level: Needs assistance Equipment used: None Transfers: Sit to/from Stand Sit to Stand: Contact guard assist           General transfer comment: CGA for safety, pt leaning posterior aspects of legs against bed for support when transferring to stand, no LOB but mild sway noted    Ambulation/Gait Ambulation/Gait assistance: Contact guard assist, Min assist Gait Distance (Feet): 30 Feet Assistive device: None Gait Pattern/deviations: Step-through pattern, Decreased stride length, Ataxic (ataxic final half when pt appeared to look like he was about to pass out) Gait velocity: reduced Gait velocity interpretation: <1.8 ft/sec, indicate of risk for recurrent falls   General Gait Details: Pt ambulated  the first half out into the hall without UE support, mild lateral sway noted but no LOB, CGA for safety. However, after getting into the hall the pt stopped and became less talkative, eyes glazed over and he appeared like he was about to pass out, thus turned pt around and guided him quickly back to the EOB to sit, uncoordinated steps noted ambulating back to the bed, minA  for balance.  Stairs            Wheelchair Mobility     Tilt Bed    Modified Rankin (Stroke Patients Only)       Balance Overall balance assessment: Mild deficits observed, not formally tested                                           Pertinent Vitals/Pain Pain Assessment Faces Pain Scale: No hurt Pain Intervention(s): Monitored during session    Home Living Family/patient expects to be discharged to:: Private residence Living Arrangements: Spouse/significant other;Children Available Help at Discharge: Family;Available 24 hours/day Type of Home: House Home Access: Stairs to enter Entrance Stairs-Rails: Left Entrance Stairs-Number of Steps: 9-12   Home Layout: One level Home Equipment: BSC/3in1;Shower seat      Prior Function Prior Level of Function : Independent/Modified Independent;Driving             Mobility Comments: No AD       Extremity/Trunk Assessment   Upper Extremity Assessment Upper Extremity Assessment: Defer to OT evaluation    Lower Extremity Assessment Lower Extremity Assessment: Generalized weakness (grossly appeared >4/5 in bil legs though)    Cervical / Trunk Assessment Cervical / Trunk Assessment: Normal  Communication   Communication Communication: No apparent difficulties    Cognition Arousal: Alert Behavior During Therapy: WFL for tasks assessed/performed   PT - Cognitive impairments: No apparent impairments                         Following commands: Intact       Cueing Cueing Techniques: Verbal cues     General Comments General comments (skin integrity, edema, etc.): SpO2 as low as 85% on RA when ambulating (recovered quickly to >/= 97% on 6L when sitting), did not get time to check how much supplemental O2 he needed to ambulate due to urgent need to return pt to bed when he became less talkative and appeared like he was about to pass out when he reached the hall, pt quickly  recovered once sitting with BP 139/56 (78) in sitting once recovered, did not get BP measures prior to and when ambulating    Exercises     Assessment/Plan    PT Assessment Patient needs continued PT services  PT Problem List Decreased strength;Decreased activity tolerance;Decreased balance;Decreased mobility;Decreased coordination;Cardiopulmonary status limiting activity       PT Treatment Interventions DME instruction;Gait training;Functional mobility training;Therapeutic activities;Therapeutic exercise;Balance training;Stair training;Neuromuscular re-education;Patient/family education    PT Goals (Current goals can be found in the Care Plan section)  Acute Rehab PT Goals Patient Stated Goal: to improve PT Goal Formulation: With patient Time For Goal Achievement: 04/01/23 Potential to Achieve Goals: Good    Frequency Min 1X/week     Co-evaluation               AM-PAC PT "6 Clicks" Mobility  Outcome Measure Help needed turning from your  back to your side while in a flat bed without using bedrails?: None Help needed moving from lying on your back to sitting on the side of a flat bed without using bedrails?: None Help needed moving to and from a bed to a chair (including a wheelchair)?: A Little Help needed standing up from a chair using your arms (e.g., wheelchair or bedside chair)?: A Little Help needed to walk in hospital room?: A Little Help needed climbing 3-5 steps with a railing? : A Little 6 Click Score: 20    End of Session   Activity Tolerance: Other (comment) (limited by dizziness) Patient left: in bed;with call bell/phone within reach;with bed alarm set (sitting EOB eating lunch, mostly recovered, reporting some mild dizziness still present) Nurse Communication: Mobility status;Other (comment) (need to watch vitals, currently sitting EOB eating lunch, mostly recovered, reporting some mild dizziness still present) PT Visit Diagnosis: Unsteadiness on feet  (R26.81);Other abnormalities of gait and mobility (R26.89);Muscle weakness (generalized) (M62.81);Difficulty in walking, not elsewhere classified (R26.2);Dizziness and giddiness (R42)    Time: 1610-9604 PT Time Calculation (min) (ACUTE ONLY): 11 min   Charges:   PT Evaluation $PT Eval Moderate Complexity: 1 Mod   PT General Charges $$ ACUTE PT VISIT: 1 Visit         Virgil Benedict, PT, DPT Acute Rehabilitation Services  Office: [phone]   Bettina Gavia 03/18/2023, 2:01 PM

## 2023-03-19 ENCOUNTER — Inpatient Hospital Stay (HOSPITAL_COMMUNITY): Payer: Medicare Other

## 2023-03-19 ENCOUNTER — Other Ambulatory Visit: Payer: Self-pay | Admitting: Hematology and Oncology

## 2023-03-19 ENCOUNTER — Ambulatory Visit (HOSPITAL_BASED_OUTPATIENT_CLINIC_OR_DEPARTMENT_OTHER): Payer: Medicare Other | Admitting: Family

## 2023-03-19 DIAGNOSIS — I5020 Unspecified systolic (congestive) heart failure: Secondary | ICD-10-CM | POA: Diagnosis not present

## 2023-03-19 DIAGNOSIS — J9601 Acute respiratory failure with hypoxia: Secondary | ICD-10-CM | POA: Diagnosis not present

## 2023-03-19 DIAGNOSIS — J09X1 Influenza due to identified novel influenza A virus with pneumonia: Secondary | ICD-10-CM | POA: Diagnosis not present

## 2023-03-19 DIAGNOSIS — N186 End stage renal disease: Secondary | ICD-10-CM | POA: Diagnosis not present

## 2023-03-19 LAB — CBC WITH DIFFERENTIAL/PLATELET
Abs Immature Granulocytes: 0.01 10*3/uL (ref 0.00–0.07)
Basophils Absolute: 0 10*3/uL (ref 0.0–0.1)
Basophils Relative: 0 %
Eosinophils Absolute: 0 10*3/uL (ref 0.0–0.5)
Eosinophils Relative: 0 %
HCT: 24.2 % — ABNORMAL LOW (ref 39.0–52.0)
Hemoglobin: 8.1 g/dL — ABNORMAL LOW (ref 13.0–17.0)
Immature Granulocytes: 0 %
Lymphocytes Relative: 17 %
Lymphs Abs: 0.4 10*3/uL — ABNORMAL LOW (ref 0.7–4.0)
MCH: 31.5 pg (ref 26.0–34.0)
MCHC: 33.5 g/dL (ref 30.0–36.0)
MCV: 94.2 fL (ref 80.0–100.0)
Monocytes Absolute: 0.3 10*3/uL (ref 0.1–1.0)
Monocytes Relative: 12 %
Neutro Abs: 1.7 10*3/uL (ref 1.7–7.7)
Neutrophils Relative %: 71 %
Platelets: 65 10*3/uL — ABNORMAL LOW (ref 150–400)
RBC: 2.57 MIL/uL — ABNORMAL LOW (ref 4.22–5.81)
RDW: 15.1 % (ref 11.5–15.5)
WBC: 2.4 10*3/uL — ABNORMAL LOW (ref 4.0–10.5)
nRBC: 0 % (ref 0.0–0.2)

## 2023-03-19 LAB — RENAL FUNCTION PANEL
Albumin: 3 g/dL — ABNORMAL LOW (ref 3.5–5.0)
Anion gap: 15 (ref 5–15)
BUN: 60 mg/dL — ABNORMAL HIGH (ref 8–23)
CO2: 22 mmol/L (ref 22–32)
Calcium: 9.3 mg/dL (ref 8.9–10.3)
Chloride: 96 mmol/L — ABNORMAL LOW (ref 98–111)
Creatinine, Ser: 10.61 mg/dL — ABNORMAL HIGH (ref 0.61–1.24)
GFR, Estimated: 5 mL/min — ABNORMAL LOW (ref >60)
Glucose, Bld: 99 mg/dL (ref 70–99)
Phosphorus: 5.5 mg/dL — ABNORMAL HIGH (ref 2.5–4.6)
Potassium: 4 mmol/L (ref 3.5–5.1)
Sodium: 133 mmol/L — ABNORMAL LOW (ref 135–145)

## 2023-03-19 LAB — MAGNESIUM: Magnesium: 2.3 mg/dL (ref 1.7–2.4)

## 2023-03-19 LAB — HEPATITIS B SURFACE ANTIBODY, QUANTITATIVE: Hep B S AB Quant (Post): 17.7 m[IU]/mL

## 2023-03-19 MED ORDER — HEPARIN SODIUM (PORCINE) 1000 UNIT/ML DIALYSIS: 1000.0000 [IU] | INTRAMUSCULAR | Status: DC | PRN

## 2023-03-19 MED ORDER — OSELTAMIVIR PHOSPHATE 30 MG PO CAPS
30.0000 mg | ORAL_CAPSULE | ORAL | Status: AC
  Administered 2023-03-19 – 2023-03-21 (×2): 30 mg via ORAL
  Filled 2023-03-19 (×2): qty 1

## 2023-03-19 MED ORDER — HEPARIN SODIUM (PORCINE) 1000 UNIT/ML DIALYSIS
2500.0000 [IU] | Freq: Once | INTRAMUSCULAR | Status: AC
  Administered 2023-03-19: 2500 [IU] via INTRAVENOUS_CENTRAL
  Filled 2023-03-19: qty 3

## 2023-03-19 MED ORDER — PENTAFLUOROPROP-TETRAFLUOROETH EX AERO: 1.0000 | INHALATION_SPRAY | CUTANEOUS | Status: DC | PRN

## 2023-03-19 MED ORDER — ALTEPLASE 2 MG IJ SOLR: 2.0000 mg | Freq: Once | INTRAMUSCULAR | Status: DC | PRN

## 2023-03-19 MED ORDER — MOMETASONE FURO-FORMOTEROL FUM 200-5 MCG/ACT IN AERO
2.0000 | INHALATION_SPRAY | Freq: Two times a day (BID) | RESPIRATORY_TRACT | Status: DC
  Administered 2023-03-19 – 2023-03-22 (×7): 2 via RESPIRATORY_TRACT
  Filled 2023-03-19 (×2): qty 8.8

## 2023-03-19 MED ORDER — ANTICOAGULANT SODIUM CITRATE 4% (200MG/5ML) IV SOLN: 5.0000 mL | Status: DC | PRN

## 2023-03-19 MED ORDER — TRAZODONE HCL 50 MG PO TABS
50.0000 mg | ORAL_TABLET | Freq: Every evening | ORAL | Status: DC | PRN
  Administered 2023-03-19 – 2023-03-22 (×5): 50 mg via ORAL
  Filled 2023-03-19 (×5): qty 1

## 2023-03-19 MED ORDER — LIDOCAINE-PRILOCAINE 2.5-2.5 % EX CREA: 1.0000 | TOPICAL_CREAM | CUTANEOUS | Status: DC | PRN

## 2023-03-19 MED ORDER — LIDOCAINE HCL (PF) 1 % IJ SOLN: 5.0000 mL | INTRAMUSCULAR | Status: DC | PRN

## 2023-03-19 MED ORDER — NEPRO/CARBSTEADY PO LIQD: 237.0000 mL | ORAL | Status: DC | PRN

## 2023-03-19 NOTE — Progress Notes (Signed)
 Received patient in bed to unit.  Alert and oriented.  Informed consent signed and in chart.   TX duration: 3.5 hours  Patient tolerated well.  Transported back to the room  Alert, without acute distress.  Hand-off given to patient's nurse.   Access used: Left forearm fistula Access issues: High Venous pressures made [place] put BFR to 300  Total UF removed: 3.5L Medication(s) given: none   03/19/23 1225  Vitals  Temp 98 F (36.7 C)  Temp Source Oral  BP (!) 157/61  MAP (mmHg) 88  Pulse Rate 61  ECG Heart Rate 63  Resp (!) 25  Oxygen Therapy  SpO2 100 %  O2 Device Nasal Cannula  O2 Flow Rate (L/min) 6 L/min  During Treatment Monitoring  Duration of HD Treatment -hour(s) 3.5 hour(s)  HD Safety Checks Performed Yes  Intra-Hemodialysis Comments Tx completed  Dialysis Fluid Bolus Normal Saline  Bolus Amount (mL) 300 mL  Post Treatment  Dialyzer Clearance Lightly streaked  Liters Processed 68.8  Fluid Removed (mL) 3500 mL  Tolerated HD Treatment Yes  AVG/AVF Arterial Site Held (minutes) 7 minutes  AVG/AVF Venous Site Held (minutes) 7 minutes  Fistula / Graft Left Forearm Arteriovenous fistula  No placement date or time found.   Placed prior to admission: Yes  Orientation: Left  Access Location: Forearm  Access Type: Arteriovenous fistula  Status Deaccessed     Stacie Glaze LPN Kidney Dialysis Unit

## 2023-03-19 NOTE — Assessment & Plan Note (Addendum)
 Chronic, likely in the setting of multiple comorbidities per Heme/Onc. Platelets low but stable on repeat labs. Neutropenia has improved this AM. Last Neulasta injection 01/16. Smear obtained with tear drop cells and elliptocytes present but no WBC morphology.  -Trend CBC w/ diff -Monitor hgb, transfusion threshold >8 hgb  -Neutropenia has improved this AM, likely primary per heme/onc -Heme/onc following, Dr. Pamelia Hoit (heme/onc) will assess today

## 2023-03-19 NOTE — Progress Notes (Signed)
 OT Cancellation Note  Patient Details Name: Kirk [name] MRN: [id number] DOB: 1960/11/23   Cancelled Treatment:    Reason Eval/Treat Not Completed: Patient at procedure or test/ unavailable. Pt out in HD will follow.   Presley Raddle OTR/L  Acute Rehab Services  [phone] office number   Alphia Moh 03/19/2023, 9:44 AM

## 2023-03-19 NOTE — Progress Notes (Signed)
 Heart Failure Navigator Progress Note  Assessed for Heart & Vascular TOC clinic readiness.  Patient does not meet criteria due to ESRD on Hemodialysis.   Navigator will sign off at this time.   Rhae Hammock, BSN, Scientist, clinical (histocompatibility and immunogenetics) Only

## 2023-03-19 NOTE — Assessment & Plan Note (Addendum)
-  Tamiflu 30 mg on dialysis days (last dose 3/3)

## 2023-03-19 NOTE — Assessment & Plan Note (Addendum)
 MWF, EDW: 88.6kg. Last outpatient HD with only 1 L off, pulled 4L off on evening of 02/22 while admitted. 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  -Nephrology following, appreciate recs -Continue home Phos binders -HD today, refused run yesterday   -monitor RFP -monitor weights

## 2023-03-19 NOTE — Progress Notes (Signed)
 Hematology Reviewed the labs from the current hospitalization. Kirk [name] is 63 year old who has been admitted with acute respiratory failure with influenza viral positivity.  Currently being treated with Tamiflu and prednisone.  He also has end-stage kidney disease on hemodialysis.     Latest Ref Rng & Units 03/19/2023    3:06 AM 03/18/2023   11:28 AM 03/18/2023    2:36 AM  CBC  WBC 4.0 - 10.5 K/uL 2.4  1.8  2.2   Hemoglobin 13.0 - 17.0 g/dL 8.1  8.2  7.9   Hematocrit 39.0 - 52.0 % 24.2  25.1  24.8   Platelets 150 - 400 K/uL 65  61  62     Pancytopenia: Longstanding in nature.  Every time he gets infections his blood counts significantly drop.  He does recover to a certain degree after the infection subsides.  Previous  Bone marrow biopsy on 03/27/2022 did not reveal any bone marrow disorders (: Flow cytometry negative, normocellular bone marrow with orderly trilineage hematopoiesis with mild erythroid hyperplasia with left shift, storage iron: Increased histiocytic iron stores/iron overload)  Cause of pancytopenia: Possibly multifactorial Related to his acute infection over multiple systemic illnesses. Since his white count and platelet count have stabilized, I do not recommend any other interventions at this time but to treat him with supportive care measures like bladder platelets if he has profound anemia (hemoglobin below 8) or severe thrombocytopenia (platelet count goes below 10.)  Will follow-up.

## 2023-03-19 NOTE — Assessment & Plan Note (Addendum)
 Stable overall; currently on CPAP while sleeping and 6L via [place] when at rest (does not require O2 at home). Satting well on continued oxygen support. Did not receive dialysis yesterday but will receive session today. CXR yesterday obtained and was without evidence of worsening congestion or consolidation. Was assessed by PT yesterday, had some difficulty with ambulation and became pre-syncopal per their notes. Desatted to 85% on RA while ambulating but recovered quickly with sitting and on O2 support.    -Continue Tamiflu (discussed below) -Continue prednisone 40mg  (02/24-02/27)  -DuoNebs q6h -Using CPAP with sleeping, on 6L [place]; wean O2 as tolerated  -Supportive care: Tylenol, Mucinex -Continue pulm toilet, spirometry  -Add flutter valve, RT to assist  -f/up bcx, NGTD -If he is unable to wean by tomorrow, will consider adding antibiotics and pulm consult for additional considerations  -Per outpt pulm notes, was previously on Advair. Will add back Dulera 2 puffs BID -SLP evaluated, ontain MBS -obtain orthostatics

## 2023-03-19 NOTE — Progress Notes (Signed)
 Pt receives out-pt HD at [name] on MWF 11:45 am chair time. Will assist as needed.   Olivia Canter Renal Navigator [phone]

## 2023-03-19 NOTE — Assessment & Plan Note (Addendum)
 Repeat echo obtained 02/24 with EF worsened to 45-50% and evidence of G2DD. Additionally, aortic valve with severe stenosis and moderately elevated PA pressure.  -Volume management as above. -Continue home meds Imdur, Metoprolol -Will need cards follow up outpatient for optimization of medical therapy

## 2023-03-19 NOTE — Progress Notes (Signed)
 Daily Progress Note Intern Pager: [phone]  Patient name: Kirk [name] Medical record number: [id number] Date of birth: 08-30-1960 Age: 63 y.o. Gender: male  Primary Care Provider: Caesar Bookman, NP Consultants: Nephro  Code Status: Full   Pt Overview and Major Events to Date:  02/22 admitted, HD session (late evening)  02/24: echo performed, repeat CXR  02/25: HD session   Assessment and Plan:  Kirk [name] is a 63 year old male PMH ESRD on HD (MWF), Alport syndrome s/p kidney transplant, metastatic breast cancer (currently on Enhurtu), chronic pancytopenia, recurrent pneumonia, OSA on CPAP, CAD, mild persistent asthma, systolic heart failure, GI bleed, subdural hematoma, HTN and T2DM admitted for acute hypoxic respiratory failure iso Flu A infection.  Assessment & Plan Acute hypoxic respiratory failure (HCC) Stable overall; currently on CPAP while sleeping and 6L via [place] when at rest (does not require O2 at home). Satting well on continued oxygen support. Did not receive dialysis yesterday but will receive session today. CXR yesterday obtained and was without evidence of worsening congestion or consolidation. Was assessed by PT yesterday, had some difficulty with ambulation and became pre-syncopal per their notes. Desatted to 85% on RA while ambulating but recovered quickly with sitting and on O2 support.    -Continue Tamiflu (discussed below) -Continue prednisone 40mg  (02/24-02/27)  -DuoNebs q6h -Using CPAP with sleeping, on 6L [place]; wean O2 as tolerated  -Supportive care: Tylenol, Mucinex -Continue pulm toilet, spirometry  -Add flutter valve, RT to assist  -f/up bcx, NGTD -If he is unable to wean by tomorrow, will consider adding antibiotics and pulm consult for additional considerations  -Per outpt pulm notes, was previously on Advair. Will add back Dulera 2 puffs BID -SLP evaluated, ontain MBS -obtain orthostatics  Influenza A with pneumonia -Tamiflu 30 mg on  dialysis days (last dose 3/3)  ESRD (end stage renal disease) on dialysis (HCC) MWF, EDW: 88.6kg. Last outpatient HD with only 1 L off, pulled 4L off on evening of 02/22 while admitted. 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  -Nephrology following, appreciate recs -Continue home Phos binders -HD today, refused run yesterday   -monitor RFP -monitor weights   (HFpEF) heart failure with preserved ejection fraction (HCC) Repeat echo obtained 02/24 with EF worsened to 45-50% and evidence of G2DD. Additionally, aortic valve with severe stenosis and moderately elevated PA pressure.  -Volume management as above. -Continue home meds Imdur, Metoprolol -Will need cards follow up outpatient for optimization of medical therapy   Pancytopenia (HCC) Chronic, likely in the setting of multiple comorbidities per Heme/Onc. Platelets low but stable on repeat labs. Neutropenia has improved this AM. Last Neulasta injection 01/16. Smear obtained with tear drop cells and elliptocytes present but no WBC morphology.  -Trend CBC w/ diff -Monitor hgb, transfusion threshold >8 hgb  -Neutropenia has improved this AM, likely primary per heme/onc -Heme/onc following, Dr. Pamelia Hoit (heme/onc) will assess today   Chronic and Stable Issues: HTN: Continue amlodipine 10 mg daily, hydralazine 100 mg 3 times daily T2DM: Last A1c 5.2 in 01/2023. Diet controlled. Daily fasting CBG Insomnia: Continue doxazosin 8 mg nightly Depression: Continue duloxetine 60 mg daily CAD: Continue ASA 81 mg HLD: Continue pravastatin 40 mg daily OSA: CPAP nightly  FEN/GI: Thin liquid diet  PPx: heparin  Dispo: Likely Home with home health pending clinical improvement . Barriers include continued need for O2.   Subjective:  NAEON. Does feel slightly improved overall but still  short of breath with ambulating and movement. No chest pain, other complaints at this time.     Objective: Temp:  [97.4 F (36.3 C)-98.4 F (36.9 C)] 97.4 F (36.3 C) (02/25 0431) Pulse Rate:  [58-72] 66 (02/25 0431) Resp:  [18-20] 20 (02/25 0431) BP: (135-153)/(52-65) 149/54 (02/25 0431) SpO2:  [90 %-100 %] 90 % (02/25 0431) Physical Exam: General: Resting comfortably in bed. NAD.    Cardiovascular: RRR. Systolic murmur. No rubs, gallops.  Respiratory: Breathing comfortably on 6LNC. Coarse breath sounds throughout all lung fields. No wheezes, crackles.  Abdomen: Soft, NTND.  Extremities: Warm. Trace edema bilaterally.     Laboratory: Most recent CBC Lab Results  Component Value Date   WBC 2.4 (L) 03/19/2023   HGB 8.1 (L) 03/19/2023   HCT 24.2 (L) 03/19/2023   MCV 94.2 03/19/2023   PLT 65 (L) 03/19/2023   Most recent BMP    Latest Ref Rng & Units 03/19/2023    3:06 AM  BMP  Glucose 70 - 99 mg/dL 99   BUN 8 - 23 mg/dL 60   Creatinine 1.61 - 1.24 mg/dL 09.60   Sodium 454 - 098 mmol/L 133   Potassium 3.5 - 5.1 mmol/L 4.0   Chloride 98 - 111 mmol/L 96   CO2 22 - 32 mmol/L 22   Calcium 8.9 - 10.3 mg/dL 9.3     Other pertinent labs: mg wnl    Imaging/Diagnostic Tests: CXR (02/24):  IMPRESSION: Cardiac enlargement with mild central vascular congestion, similar to prior study. No developing edema or consolidation.  Echo (02/24):  IMPRESSIONS   1. Left ventricular ejection fraction, by estimation, is 45 to 50%. Left  ventricular ejection fraction by 2D MOD biplane is 49.7 %. The left  ventricle has mildly decreased function. The left ventricle demonstrates  global hypokinesis. The left  ventricular internal cavity size was mildly dilated. Left ventricular  diastolic parameters are consistent with Grade II diastolic dysfunction  (pseudonormalization).   2. Right ventricular systolic function is normal. The right ventricular  size is normal. There is moderately elevated pulmonary artery systolic  pressure. The estimated right ventricular systolic pressure is  59.6 mmHg.   3. Left atrial size was moderately dilated.   4. Right atrial size was mildly dilated.   5. The mitral valve is degenerative. No evidence of mitral valve  regurgitation. No evidence of mitral stenosis. Moderate mitral annular  calcification.   6. The aortic valve was not well visualized. There is severe calcifcation  of the aortic valve. Aortic valve regurgitation is not visualized. Mild to  moderate aortic valve stenosis. Aortic valve area, by VTI measures 1.23  cm. Aortic valve mean gradient  measures 18.0 mmHg.   7. The inferior vena cava is dilated in size with >50% respiratory  variability, suggesting right atrial pressure of 8 mmHg.   [name], Medical Student 03/19/2023, 7:16 AM [facility] FPTS Intern pager: [phone], text pages welcome Secure chat group [facility] Teaching Service

## 2023-03-19 NOTE — Plan of Care (Signed)

## 2023-03-19 NOTE — Progress Notes (Signed)
 [facility] Progress Note  Subjective:   He refused HD yesterday so was done today.  Seen and examined on dialysis.  Blood pressure 172/69 and HR 69 Procedure supervised.  Tolerating goal.  Left AVF in use. States he thinks team is trying to coordinate home oxygen.   Review of systems:    Still with shortness of breath; no chest pain  Denies n/v    Vitals:   03/19/23 0900 03/19/23 0930 03/19/23 1000 03/19/23 1015  BP: (!) 150/69 (!) 179/67 (!) 176/76 (!) 172/69  Pulse: 65 65 64 62  Resp: (!) 27 (!) 21 (!) 21 (!) 21  Temp:      TempSrc:      SpO2: 100% 100% 100% 100%  Weight:      Height:        Physical Exam:    General adult male in bed in no acute distress HEENT normocephalic atraumatic extraocular movements intact sclera anicteric Neck supple trachea midline Lungs clear to auscultation bilaterally normal work of breathing at rest on 6 liters oxygen Heart S1S2 no rub Abdomen soft nontender nondistended Extremities no lower extremity edema  Psych normal mood and affect Left AVF in use       Renal-related home meds: - norvasc 10 - cardura 8 mg hs - hydralazine 100 tid - metoprolol 100 bid - renavite - renvela 1600 ac tid - velphoro 1 gm ac tid      OP HD:  MWF  [name] EDW 88.6 kg  4h   B450  ml/min DF 700 ml/min  2K/2 Ca bath L AVF  Meds: mircera 150 mcg every 2 weeks (last given 2/19), venofer 50 mg weekly, calcitriol 1.75 mcg three times a week  CXR 2/22 - vascular congestion bilat  CTA chest     Assessment/ Plan: SOB / Acute hypoxic resp failure/ volume - CXR w/ congestion, poss early edema. S/p HD with 4 kg UF here with improvement on 2/22. Then on 2/23 AM was in distress again, back on bipap.  CT chest doesn't suggest fluid overload, could be related to viral infection/ bronchitis/ etc.    Supportive care for flu A per primary team  Optimize volume status with HD Acute flu A infection - getting steroids, tamiflu, nebs per  primary team   ESRD - on HD MWF usually.  He is off schedule this week after refusing HD on Monday.  HD TTS this week while inpatient.  Will need to ensure that we update his EDW to his lowest post weight here as he appears to have lost weight.   HTN - optimize fluid removal with HD  Anemia of esrd - last outpatient ESA on 2/19. PRBC's per primary team   Secondary hyperparathyroidism - hyperphos - on velphoro  Alport's syndrome - noted   Disposition - per primary team     Recent Labs  Lab 03/18/23 0238 03/18/23 1128 03/19/23 0306  HGB  --  8.2* 8.1*  ALBUMIN 3.0*  --  3.0*  CALCIUM 9.6  --  9.3  PHOS 5.4*  --  5.5*  CREATININE 8.44*  --  10.61*  K 3.9  --  4.0   No results for input(s): "IRON", "TIBC", "FERRITIN" in the last 168 hours. Inpatient medications:  amLODipine  10 mg Oral Daily   aspirin  81 mg Oral Daily   Chlorhexidine Gluconate Cloth  6 each Topical Q0600   Chlorhexidine Gluconate Cloth  6 each Topical Q0600   doxazosin  8 mg Oral QHS   DULoxetine  60 mg Oral Daily   guaiFENesin  600 mg Oral BID   heparin  5,000 Units Subcutaneous Q8H   hydrALAZINE  100 mg Oral TID   ipratropium-albuterol  3 mL Nebulization Q6H   isosorbide mononitrate  60 mg Oral Daily   metoprolol tartrate  100 mg Oral BID   multivitamin  1 tablet Oral Daily   oseltamivir  30 mg Oral Q M,W,F   oseltamivir  30 mg Oral Q T,Th,Sat-1800   pravastatin  40 mg Oral Daily   predniSONE  40 mg Oral Q breakfast   sucroferric oxyhydroxide  1,000 mg Oral TID WC    anticoagulant sodium citrate      acetaminophen, albuterol, alteplase, anticoagulant sodium citrate, feeding supplement (NEPRO CARB STEADY), guaiFENesin, heparin, [START ON 03/20/2023] heparin, lidocaine (PF), lidocaine-prilocaine, pentafluoroprop-tetrafluoroeth, traMADol, traZODone     Estanislado Emms, MD 10:35 AM 03/19/2023

## 2023-03-19 NOTE — Progress Notes (Signed)
 SLP Cancellation Note  Patient Details Name: Kirk [name] MRN: [id number] DOB: 1960/04/12   Cancelled treatment:       Reason Eval/Treat Not Completed: Patient at procedure or test/unavailable. SLP team attempted to schedule MBS earlier today but pt was in HD at that time. Discussed with radiology and will plan for next date if medically appropriate.     Mahala Menghini., M.A. CCC-SLP Acute Rehabilitation Services Office [phone]  Secure chat preferred  03/19/2023, 3:50 PM

## 2023-03-20 ENCOUNTER — Inpatient Hospital Stay (HOSPITAL_COMMUNITY): Payer: Medicare Other

## 2023-03-20 DIAGNOSIS — R1319 Other dysphagia: Secondary | ICD-10-CM

## 2023-03-20 DIAGNOSIS — I951 Orthostatic hypotension: Secondary | ICD-10-CM | POA: Diagnosis not present

## 2023-03-20 DIAGNOSIS — R1310 Dysphagia, unspecified: Secondary | ICD-10-CM

## 2023-03-20 DIAGNOSIS — J449 Chronic obstructive pulmonary disease, unspecified: Secondary | ICD-10-CM | POA: Diagnosis not present

## 2023-03-20 DIAGNOSIS — J09X1 Influenza due to identified novel influenza A virus with pneumonia: Secondary | ICD-10-CM | POA: Diagnosis not present

## 2023-03-20 DIAGNOSIS — N186 End stage renal disease: Secondary | ICD-10-CM | POA: Diagnosis not present

## 2023-03-20 DIAGNOSIS — J9601 Acute respiratory failure with hypoxia: Secondary | ICD-10-CM | POA: Diagnosis not present

## 2023-03-20 LAB — CBC WITH DIFFERENTIAL/PLATELET
Abs Immature Granulocytes: 0.02 10*3/uL (ref 0.00–0.07)
Basophils Absolute: 0 10*3/uL (ref 0.0–0.1)
Basophils Relative: 0 %
Eosinophils Absolute: 0 10*3/uL (ref 0.0–0.5)
Eosinophils Relative: 0 %
HCT: 26.6 % — ABNORMAL LOW (ref 39.0–52.0)
Hemoglobin: 8.8 g/dL — ABNORMAL LOW (ref 13.0–17.0)
Immature Granulocytes: 1 %
Lymphocytes Relative: 20 %
Lymphs Abs: 0.6 10*3/uL — ABNORMAL LOW (ref 0.7–4.0)
MCH: 31.4 pg (ref 26.0–34.0)
MCHC: 33.1 g/dL (ref 30.0–36.0)
MCV: 95 fL (ref 80.0–100.0)
Monocytes Absolute: 0.3 10*3/uL (ref 0.1–1.0)
Monocytes Relative: 11 %
Neutro Abs: 2.1 10*3/uL (ref 1.7–7.7)
Neutrophils Relative %: 68 %
Platelets: 73 10*3/uL — ABNORMAL LOW (ref 150–400)
RBC: 2.8 MIL/uL — ABNORMAL LOW (ref 4.22–5.81)
RDW: 15.2 % (ref 11.5–15.5)
WBC: 3 10*3/uL — ABNORMAL LOW (ref 4.0–10.5)
nRBC: 0 % (ref 0.0–0.2)

## 2023-03-20 LAB — RENAL FUNCTION PANEL
Albumin: 3.1 g/dL — ABNORMAL LOW (ref 3.5–5.0)
Anion gap: 12 (ref 5–15)
BUN: 39 mg/dL — ABNORMAL HIGH (ref 8–23)
CO2: 26 mmol/L (ref 22–32)
Calcium: 9.6 mg/dL (ref 8.9–10.3)
Chloride: 97 mmol/L — ABNORMAL LOW (ref 98–111)
Creatinine, Ser: 7.73 mg/dL — ABNORMAL HIGH (ref 0.61–1.24)
GFR, Estimated: 7 mL/min — ABNORMAL LOW (ref >60)
Glucose, Bld: 96 mg/dL (ref 70–99)
Phosphorus: 4.4 mg/dL (ref 2.5–4.6)
Potassium: 4.6 mmol/L (ref 3.5–5.1)
Sodium: 135 mmol/L (ref 135–145)

## 2023-03-20 LAB — MAGNESIUM: Magnesium: 2.2 mg/dL (ref 1.7–2.4)

## 2023-03-20 MED ORDER — PANTOPRAZOLE SODIUM 20 MG PO TBEC
20.0000 mg | DELAYED_RELEASE_TABLET | Freq: Every day | ORAL | Status: DC
  Administered 2023-03-20 – 2023-03-22 (×3): 20 mg via ORAL
  Filled 2023-03-20 (×3): qty 1

## 2023-03-20 MED ORDER — ONDANSETRON 4 MG PO TBDP
4.0000 mg | ORAL_TABLET | Freq: Three times a day (TID) | ORAL | Status: DC | PRN
  Administered 2023-03-20: 4 mg via ORAL
  Filled 2023-03-20: qty 1

## 2023-03-20 MED ORDER — FAMOTIDINE 20 MG PO TABS
10.0000 mg | ORAL_TABLET | Freq: Once | ORAL | Status: AC
  Administered 2023-03-20: 10 mg via ORAL
  Filled 2023-03-20: qty 1

## 2023-03-20 MED ORDER — HYDRALAZINE HCL 50 MG PO TABS
50.0000 mg | ORAL_TABLET | Freq: Three times a day (TID) | ORAL | Status: DC
  Administered 2023-03-20 – 2023-03-23 (×8): 50 mg via ORAL
  Filled 2023-03-20 (×9): qty 1

## 2023-03-20 MED ORDER — METOPROLOL TARTRATE 50 MG PO TABS
50.0000 mg | ORAL_TABLET | Freq: Two times a day (BID) | ORAL | Status: DC
  Administered 2023-03-20 – 2023-03-23 (×5): 50 mg via ORAL
  Filled 2023-03-20 (×6): qty 1

## 2023-03-20 MED ORDER — CHLORHEXIDINE GLUCONATE CLOTH 2 % EX PADS
6.0000 | MEDICATED_PAD | Freq: Every day | CUTANEOUS | Status: DC
  Administered 2023-03-21 – 2023-03-22 (×2): 6 via TOPICAL

## 2023-03-20 NOTE — Assessment & Plan Note (Addendum)
 MWF, EDW: 88.6kg. 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 Tolerated HD well yesterday with off.  -Nephrology following, appreciate recs -Continue home Phos binders -monitor RFP, Mg -monitor weights

## 2023-03-20 NOTE — Assessment & Plan Note (Addendum)
 Repeat echo obtained 02/24 with EF worsened to 45-50% and evidence of G2DD. Additionally, aortic valve with severe stenosis and moderately elevated PA pressure.  -Volume management as above, stable overall. -Continue home Imdur; reduction in metop as below  -Will need cards follow up outpatient for optimization of medical therapy

## 2023-03-20 NOTE — Assessment & Plan Note (Addendum)
 Was able to wean to 2L this morning (no home O2 requirement). Satting well on continued oxygen support with adequate pleth. Tolerated HD well yesterday with off. Continues to feel subjectively improved as well.  -Continue Tamiflu (discussed below) -Continue prednisone 40mg  (02/24-02/27)  -DuoNebs q6h -Supportive care: Tylenol, Mucinex -Continue pulm toilet, spirometry, flutter valve, inhalers  -f/up bcx, NGTD -MBS today was clear, will advance diet -Using CPAP with sleeping, on 2L [place]; wean O2 as tolerated

## 2023-03-20 NOTE — Evaluation (Signed)
 Occupational Therapy Evaluation Patient Details Name: Kirk [name] MRN: [id number] DOB: [birth year] Today's Date: 03/20/2023   History of Present Illness   Pt is 63 year old presented to [facility] on  03/16/23 for SOB and feeling of being filled with fluid even after HD.  Pt with PNA , flu A, and hypoxic respiratory failure. PMH - ESRD on HD, failed kidney transplant, htn, anxiety, OSA, SDH, GI bleed, metastatic breast cancer, pancytopenia, seizure     Clinical Impressions Pt presented completing breathing treatment and agreed to session. He reported feeling pretty good today. He completed LB dressing while sitting at EOB with CGA and set up for donning gown. Pt's BP sitting:143/72 standing: 121/53 standing post 3 mins: 111/53 standing with a march 116/65. When pt was marching while standing in front of be reported BLE gave out on them and went into sitting into the bed. Pt then wanted to complete sponge bath but noted to be very impulsive even when cueing to take their time as o2 destated down to 88% but was able to recover to 94% while on 2L and BP following sponge bath was 127/54. Pt also reported some dizziness in session. At this time recommendation for [name] services pending his progress.      If plan is discharge home, recommend the following:   A little help with walking and/or transfers;A little help with bathing/dressing/bathroom;Assist for transportation     Functional Status Assessment         Equipment Recommendations   None recommended by OT     Recommendations for Other Services         Precautions/Restrictions   Precautions Precautions: Fall Recall of Precautions/Restrictions: Intact Precaution/Restrictions Comments: watch BP and SpO2 Restrictions Weight Bearing Restrictions Per Provider Order: No     Mobility Bed Mobility Overal bed mobility: Modified Independent             General bed mobility comments: Pt presented at EOB but reported not needing  any assist but HOB was elevated    Transfers Overall transfer level: Needs assistance Equipment used: None Transfers: Sit to/from Stand Sit to Stand: Contact guard assist                  Balance Overall balance assessment: Needs assistance Sitting-balance support: No upper extremity supported, Feet supported Sitting balance-Leahy Scale: Good     Standing balance support: No upper extremity supported, Single extremity supported Standing balance-Leahy Scale: Fair Standing balance comment: Pt completed marching tasks and noted BLE gave out under them                           ADL either performed or assessed with clinical judgement   ADL Overall ADL's : Needs assistance/impaired Eating/Feeding: Independent;Sitting   Grooming: Wash/dry hands;Wash/dry face;Oral care;Set up;Sitting   Upper Body Bathing: Set up;Sitting   Lower Body Bathing: Contact guard assist;Cueing for safety;Cueing for sequencing;Sit to/from stand;Sitting/lateral leans   Upper Body Dressing : Set up;Sitting   Lower Body Dressing: Contact guard assist;Sit to/from stand   Toilet Transfer: Contact guard assist   Toileting- Clothing Manipulation and Hygiene: Contact guard assist;Sit to/from stand       Functional mobility during ADLs: Contact guard assist;Cueing for safety;Cueing for sequencing       Vision   Vision Assessment?: No apparent visual deficits Additional Comments: wear glassses     Perception Perception: Within Functional Limits       Praxis Praxis: [facility]  Pertinent Vitals/Pain Pain Assessment Pain Assessment: No/denies pain     Extremity/Trunk Assessment Upper Extremity Assessment Upper Extremity Assessment: Overall WFL for tasks assessed   Lower Extremity Assessment Lower Extremity Assessment: Defer to PT evaluation       Communication Communication Communication: No apparent difficulties Factors Affecting Communication: Hearing impaired    Cognition Arousal: Alert Behavior During Therapy: Impulsive Cognition: No apparent impairments             OT - Cognition Comments: pt very impulsive to just compelte tasks                 Following commands: Intact       Cueing  General Comments   Cueing Techniques: Verbal cues      Exercises     Shoulder Instructions      Home Living                                          Prior Functioning/Environment                      OT Problem List:     OT Treatment/Interventions:        OT Goals(Current goals can be found in the care plan section)       OT Frequency:  Min 1X/week    Co-evaluation              AM-PAC OT "6 Clicks" Daily Activity     Outcome Measure Help from another person eating meals?: None Help from another person taking care of personal grooming?: A Little Help from another person toileting, which includes using toliet, bedpan, or urinal?: A Little Help from another person bathing (including washing, rinsing, drying)?: A Little Help from another person to put on and taking off regular upper body clothing?: A Little Help from another person to put on and taking off regular lower body clothing?: A Little 6 Click Score: 19   End of Session Equipment Utilized During Treatment: Gait belt Nurse Communication: Mobility status  Activity Tolerance: Patient tolerated treatment well Patient left: in chair;with call bell/phone within reach;with chair alarm set  OT Visit Diagnosis: Unsteadiness on feet (R26.81);Other abnormalities of gait and mobility (R26.89);Repeated falls (R29.6);Muscle weakness (generalized) (M62.81)                Time: 6045-4098 OT Time Calculation (min): 45 min Charges:  OT General Charges $OT Visit: 1 Visit OT Evaluation $OT Eval Low Complexity: 1 Low OT Treatments $Self Care/Home Management : 23-37 mins  Presley Raddle OTR/L  Acute Rehab Services  [phone] office  number   Alphia Moh 03/20/2023, 9:38 AM

## 2023-03-20 NOTE — Evaluation (Signed)
 Modified Barium Swallow Study  Patient Details  Name: Kirk [name] MRN: [id number] Date of Birth: March 28, 1960  Today's Date: 03/20/2023  Modified Barium Swallow completed.  Full report located under Chart Review in the Imaging Section.  History of Present Illness Patient is a 63 y.o. male with PMH: ESRD on HD (MWF), Alport syndrome s/p kidney transplant, metastatic breast cancer, chronic pancytopenia, recurrent pneumonia, OSA on CPAP, CAD, mild persistent asthma, systolic heart failure, GI bleed, subdural hematoma, HTN and DM-2. He presented to the hospital on 03/16/23 with acute hypoxic respiratory failure. He was placed on BiPAP morning of 2/23 due to mild increase in WOB/SOB then transitioned to CPAP as needed. He was made NPO awaiting SLP swallow evaluation secondary to concern for aspiration.   Clinical Impression Pt's oropharyngeal swallow is WFL. He fairly consistently had c/o boluses sticking in his chest throughout the study (even with liquids), but when scanning the esophagus multiple times, there was never any visible barium. At one point, with a bite of puree, pt stated that he "choked" but with clarifying questions, it was gathered that he felt like it was stuck in his chest. Recommend that he resume his baseline diet. He says that he does have a little trouble chewing as his dentures don't fit well, but he prefers a regular diet that would allow him to have full range of the menu to select what he feels like he can swallow. Will continue thin liquids. SLP will f/u briefly for education but do not anticipate ongoing SLP needs beyond this acute stay.  DIGEST Swallow Severity Rating*  Safety: 0  Efficiency: 0  Overall Pharyngeal Swallow Severity: 0 1: mild; 2: moderate; 3: severe; 4: profound  *The Dynamic Imaging Grade of Swallowing Toxicity is standardized for the head and neck cancer population, however, demonstrates promising clinical applications across populations to standardize  the clinical rating of pharyngeal swallow safety and severity.  Factors that may increase risk of adverse event in presence of aspiration Rubye Oaks & Clearance Coots 2021): Respiratory or GI disease  Swallow Evaluation Recommendations Recommendations: PO diet PO Diet Recommendation: Regular;Thin liquids (Level 0) Liquid Administration via: Cup;Straw Medication Administration: Whole meds with liquid Supervision: Patient able to self-feed Swallowing strategies  : Slow rate;Small bites/sips;Follow solids with liquids Postural changes: Position pt fully upright for meals;Stay upright 30-60 min after meals Oral care recommendations: Oral care BID (2x/day) Recommended consults: Consider GI consultation (could consider on an OP basis given reported symptoms)      Mahala Menghini., M.A. CCC-SLP Acute Rehabilitation Services Office [phone]  Secure chat preferred  03/20/2023,9:28 AM

## 2023-03-20 NOTE — Progress Notes (Addendum)
 Daily Progress Note Intern Pager: [phone]  Patient name: Kirk [name] Medical record number: [id number] Date of birth: 05-Mar-1960 Age: 63 y.o. Gender: male  Primary Care Provider: Caesar Bookman, NP Consultants: Nephro, heme/onc  Code Status: Full  Pt Overview and Major Events to Date:  02/22 admitted, HD session (late evening)  02/24: echo performed, repeat CXR  02/25: HD session  02/26: MBS performed with no aspiration, orthostatics+   Assessment and Plan:  Kirk [name] is a 63 year old male PMH ESRD on HD (MWF), Alport syndrome s/p kidney transplant, metastatic breast cancer (currently on Enhurtu), chronic pancytopenia, recurrent pneumonia, OSA on CPAP, CAD, mild persistent asthma, systolic heart failure, GI bleed, subdural hematoma, HTN and T2DM admitted for acute hypoxic respiratory failure iso Flu A infection.  Assessment & Plan Acute hypoxic respiratory failure (HCC) Was able to wean to 2L this morning (no home O2 requirement). Satting well on continued oxygen support with adequate pleth. Tolerated HD well yesterday with off. Continues to feel subjectively improved as well.  -Continue Tamiflu (discussed below) -Continue prednisone 40mg  (02/24-02/27)  -DuoNebs q6h -Supportive care: Tylenol, Mucinex -Continue pulm toilet, spirometry, flutter valve, inhalers  -f/up bcx, NGTD -MBS today was clear, will advance diet -Using CPAP with sleeping, on 2L [place]; wean O2 as tolerated   Influenza A with pneumonia -Tamiflu 30 mg on dialysis days (last dose 3/3)  ESRD (end stage renal disease) on dialysis (HCC) MWF, EDW: 88.6kg. 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 Tolerated HD well yesterday with off.  -Nephrology following, appreciate recs -Continue home Phos binders -monitor RFP, Mg -monitor weights   Heart failure with mildly reduced ejection fraction (HFmrEF, 41-49%) (HCC) Repeat echo obtained  02/24 with EF worsened to 45-50% and evidence of G2DD. Additionally, aortic valve with severe stenosis and moderately elevated PA pressure.  -Volume management as above, stable overall. -Continue home Imdur; reduction in metop as below  -Will need cards follow up outpatient for optimization of medical therapy   Orthostatic hypotension Pt was noted to be pre-syncopal appearing while working with OT throughout admission. Orthostatics obtained which demonstrated drop in BP from 143/72->121/53 with sitting to standing. Could be multifactorial given reported poor PO with liquid diet, overall illness presentation, and medication-related given his current hypertensive and heart failure regimens.  -Reduce metoprolol to 50mg  BID -Repeat orthostatic vital signs with HR tomorrow  Pancytopenia (HCC) Chronic, likely in the setting of multiple comorbidities per Heme/Onc. Platelets low but stable on repeat labs. Last Neulasta injection 01/16.  -Trend CBC w/ diff -Monitor hgb, transfusion threshold >8   -Monitor plts, transfusion threshold >10 -Heme/onc following, plan to monitor as his labs have improved overall.    Dysphagia Reports sensation of choking and globus sensation in chest which is aggravated with swallowing. No overt chest pain or discomfort. MBS with SLP was negative to several trials of swallowing. Unclear underlying etiology but suspect he may have some element of baseline dysphagia as well as pulmonary clearing as his infection resolves.  -Trial Protonix 20mg  daily    Chronic and Stable Issues: HTN: Continue amlodipine 10 mg daily, hydralazine 100 mg 3 times daily T2DM: Last A1c 5.2 in 01/2023. Diet controlled. Daily fasting CBG Insomnia: Continue doxazosin 8 mg nightly Depression: Continue duloxetine 60 mg daily CAD: Continue ASA 81 mg HLD: Continue pravastatin 40 mg daily OSA: CPAP nightly  FEN/GI: Advance to regular diet with thin fluids  PPx: heparin  Dispo:Home with home health  pending clinical improvement . Barriers include continued need for O2 support.   Subjective:  NAEON. Reports mild globus sensation in chest that is slightly worsened with coughing. No overt chest pain. Breathing feels considerably improved. Does note some darker stools that have also been green. No abdominal pain, constipation, diarrhea, or bright red blood.   Objective: Temp:  [98 F (36.7 C)-98.6 F (37 C)] 98.4 F (36.9 C) (02/26 0415) Pulse Rate:  [59-67] 59 (02/26 0826) Resp:  [20-25] 20 (02/26 0826) BP: (130-182)/(45-71) 137/61 (02/26 0826) SpO2:  [91 %-100 %] 98 % (02/26 0826) Weight:  [84.9 kg] 84.9 kg (02/25 1242)  Physical Exam: General: Resting comfortably in bed. NAD.  Cardiovascular: RRR. Systolic murmur present. Transmitted sounds from patent fistula. No rubs, gallops.  Respiratory: Breathing comfortably on 2LNC. Coarse breath sounds throughout all lung fields but improved from prior. No wheezes, crackles.  Abdomen: Soft, NTND.  Extremities: Extremities are warm and well-perfused. Fistula present in distal LUE with palpable thrill. Trace edema to bilateral ankles.   Laboratory: Most recent CBC Lab Results  Component Value Date   WBC 3.0 (L) 03/20/2023   HGB 8.8 (L) 03/20/2023   HCT 26.6 (L) 03/20/2023   MCV 95.0 03/20/2023   PLT 73 (L) 03/20/2023   Most recent BMP    Latest Ref Rng & Units 03/20/2023    2:39 AM  BMP  Glucose 70 - 99 mg/dL 96   BUN 8 - 23 mg/dL 39   Creatinine 8.65 - 1.24 mg/dL 7.84   Sodium 696 - 295 mmol/L 135   Potassium 3.5 - 5.1 mmol/L 4.6   Chloride 98 - 111 mmol/L 97   CO2 22 - 32 mmol/L 26   Calcium 8.9 - 10.3 mg/dL 9.6     Other pertinent labs:  Mg wnl   Imaging/Diagnostic Tests: No new imaging in the last 24hr.   Kirk [name], Medical Student 03/20/2023, 11:25 AM [facility] FPTS Intern pager: [phone], text pages welcome Secure chat group [facility] Teaching Service    FPTS  Upper-Level Resident Addendum   I have independently interviewed and examined the patient. I have discussed the above with [facility] and agree with the documented plan. My edits for correction/addition/clarification are included above. Please see any attending notes.   Vonna Drafts, MD PGY-2, [facility] 03/20/2023 11:36 AM  FPTS Service pager: [phone] (text pages welcome through AMION)

## 2023-03-20 NOTE — Progress Notes (Signed)
 [facility] Progress Note  Subjective: Last HD on 2/25 with 3.5 kg.  He has been weaned to 2 liters.  He states that his BP has been dropping with standing.  He reports burning with urination but doesn't make much urine  Review of systems:     Shortness of breath is improved; no chest pain  Denies n/v    Vitals:   03/20/23 0205 03/20/23 0415 03/20/23 0826 03/20/23 1207  BP:  (!) 164/71 137/61 (!) 116/58  Pulse:  65 (!) 59 (!) 56  Resp:  20 20 20   Temp:  98.4 F (36.9 C)  98.5 F (36.9 C)  TempSrc:  Oral Oral Oral  SpO2: 93% 100% 98% 100%  Weight:      Height:        Physical Exam:     General adult male in bed in no acute distress HEENT normocephalic atraumatic extraocular movements intact sclera anicteric Neck supple trachea midline Lungs clear to auscultation bilaterally normal work of breathing at rest on 1 liter oxygen Heart S1S2 no rub Abdomen soft nontender nondistended Extremities no lower extremity edema  Psych normal mood and affect Left AVF in place       Renal-related home meds: - norvasc 10 - cardura 8 mg hs - hydralazine 100 tid - metoprolol 100 bid - renavite - renvela 1600 ac tid - velphoro 1 gm ac tid      OP HD:  MWF  [name] EDW 88.6 kg  4h   B450  ml/min DF 700 ml/min  2K/2 Ca bath L AVF  Meds: mircera 150 mcg every 2 weeks (last given 2/19), venofer 50 mg weekly, calcitriol 1.75 mcg three times a week  CXR 2/22 - vascular congestion bilat  CTA chest     Assessment/ Plan: SOB / Acute hypoxic resp failure/ volume - CXR w/ congestion, poss early edema. S/p HD with 4 kg UF here with improvement on 2/22. Then on 2/23 AM was in distress again, back on bipap.  CT chest doesn't suggest fluid overload, could be related to viral infection/ bronchitis/ etc.    Supportive care for flu A per primary team  Optimize volume status with HD Acute flu A infection - getting steroids, tamiflu, nebs per primary team   ESRD - on  HD MWF usually.  He is off schedule this week after refusing HD on Monday.  HD TTS this week while inpatient.  Will need to ensure that we update his EDW to his lowest post weight here as he appears to have lost weight.  Try 84.9 kg or 84.4 kg HTN - optimize fluid removal with HD.  Reduce hydralazine to 50 mg TID  Anemia of esrd - last outpatient ESA on 2/19. PRBC's per primary team   Secondary hyperparathyroidism - hyperphos - on velphoro  Alport's syndrome - noted  Dysuria- check UA  Disposition - per primary team     Recent Labs  Lab 03/19/23 0306 03/20/23 0239  HGB 8.1* 8.8*  ALBUMIN 3.0* 3.1*  CALCIUM 9.3 9.6  PHOS 5.5* 4.4  CREATININE 10.61* 7.73*  K 4.0 4.6   No results for input(s): "IRON", "TIBC", "FERRITIN" in the last 168 hours. Inpatient medications:  amLODipine  10 mg Oral Daily   aspirin  81 mg Oral Daily   Chlorhexidine Gluconate Cloth  6 each Topical Q0600   Chlorhexidine Gluconate Cloth  6 each Topical Q0600   doxazosin  8 mg Oral QHS   DULoxetine  60 mg Oral Daily   guaiFENesin  600 mg Oral BID   heparin  5,000 Units Subcutaneous Q8H   hydrALAZINE  100 mg Oral TID   isosorbide mononitrate  60 mg Oral Daily   metoprolol tartrate  50 mg Oral BID   mometasone-formoterol  2 puff Inhalation BID   multivitamin  1 tablet Oral Daily   oseltamivir  30 mg Oral Q T,Th,Sat-1800   pantoprazole  20 mg Oral Daily   pravastatin  40 mg Oral Daily   predniSONE  40 mg Oral Q breakfast   sucroferric oxyhydroxide  1,000 mg Oral TID WC      acetaminophen, albuterol, guaiFENesin, traMADol, traZODone     Estanislado Emms, MD 2:24 PM 03/20/2023

## 2023-03-20 NOTE — Assessment & Plan Note (Addendum)
 Reports sensation of choking and globus sensation in chest which is aggravated with swallowing. No overt chest pain or discomfort. MBS with SLP was negative to several trials of swallowing. Unclear underlying etiology but suspect he may have some element of baseline dysphagia as well as pulmonary clearing as his infection resolves.  -Trial Protonix 20mg  daily

## 2023-03-20 NOTE — Assessment & Plan Note (Addendum)
 Chronic, likely in the setting of multiple comorbidities per Heme/Onc. Platelets low but stable on repeat labs. Last Neulasta injection 01/16.  -Trend CBC w/ diff -Monitor hgb, transfusion threshold >8   -Monitor plts, transfusion threshold >10 -Heme/onc following, plan to monitor as his labs have improved overall.

## 2023-03-20 NOTE — Assessment & Plan Note (Addendum)
 Pt was noted to be pre-syncopal appearing while working with OT throughout admission. Orthostatics obtained which demonstrated drop in BP from 143/72->121/53 with sitting to standing. Could be multifactorial given reported poor PO with liquid diet, overall illness presentation, and medication-related given his current hypertensive and heart failure regimens.  -Reduce metoprolol to 50mg  BID -Repeat orthostatic vital signs with HR tomorrow

## 2023-03-20 NOTE — Progress Notes (Signed)
 Physical Therapy Treatment Patient Details Name: Kirk [name] MRN: [id number] DOB: May 23, 1960 Today's Date: 03/20/2023   History of Present Illness Pt is 63 year old presented to [facility] on  03/16/23 for SOB and feeling of being filled with fluid even after HD.  Pt with PNA , flu A, and hypoxic respiratory failure. PMH - ESRD on HD, failed kidney transplant, htn, anxiety, OSA, SDH, GI bleed, metastatic breast cancer, pancytopenia, seizure    PT Comments  Session focused on theract to promote functional mobility and monitor orthostatics. During the pt's first sit to stand transfer pt reported increased dizziness and displayed s/sxs consistent with a pre-syncopal episode and was returned to the recliner. Pt was able to walk 4 ft with rollator before again displaying a pre-syncopal episode and was returned to sitting at EOB. Pt continues to display deficits in activity tolerance, LE strength, functional mobility, and gait s/t dizziness/lightheadedness that develop upon dynamic mobility. Pt would benefit from continued rollator use to allow for seated rest breaks for dizziness episodes. Will continue to follow acutely.   Pt's BP & SpO2 Seated: 128/53 Seated after 1st pre-syncopal: 122/52 Standing: 116/52 Seated at end: 123/73  At rest pt's SpO2 remained > 95% on RA. However, given pt's pre-syncopal episodes with dynamic mobility further ambulatory sats couldn't be obtained.    If plan is discharge home, recommend the following: A little help with walking and/or transfers;A little help with bathing/dressing/bathroom;Assistance with cooking/housework;Assist for transportation;Help with stairs or ramp for entrance   Can travel by private vehicle        Equipment Recommendations  Rollator (4 wheels) (pending progress)    Recommendations for Other Services       Precautions / Restrictions Precautions Precautions: Fall Recall of Precautions/Restrictions: Intact Precaution/Restrictions Comments:  watch BP and SpO2 Restrictions Weight Bearing Restrictions Per Provider Order: No     Mobility  Bed Mobility               General bed mobility comments: Pt in recliner upon entry. Pt returned to recliner at EOS    Transfers Overall transfer level: Needs assistance Equipment used: Rollator (4 wheels) Transfers: Sit to/from Stand, Bed to chair/wheelchair/BSC Sit to Stand: Contact guard assist, Min assist   Step pivot transfers: Min assist       General transfer comment: CGA for higher surface of recliner. Min A for power up from lower bed surface. Pt stands with increased fwd trunk posture and requires additional time and cueing to lock rollator. Pt required min A for step pivot s/t increased fatigue/dizziness and eccentric control sitting into recliner    Ambulation/Gait Ambulation/Gait assistance: Min assist Gait Distance (Feet): 5 Feet Assistive device: Rollator (4 wheels) Gait Pattern/deviations: Step-through pattern, Decreased stride length, Shuffle, Drifts right/left, Trunk flexed Gait velocity: reduced Gait velocity interpretation: <1.31 ft/sec, indicative of household ambulator   General Gait Details: Pt ambulated with short, slow, and shuffling steps with rollator  and required cueing for maintaining a closer proximity to rollator. As pt dizziness/lightheadedness increased he began to walk with increased fwd trunk flexion and knee flexion.   Stairs             Wheelchair Mobility     Tilt Bed    Modified Rankin (Stroke Patients Only)       Balance Overall balance assessment: Needs assistance Sitting-balance support: No upper extremity supported, Feet supported Sitting balance-Leahy Scale: Good Sitting balance - Comments: Prior to mobility, pt sits in recliner with no directional lean  or LOB. After mobility, pt sits in recliner/EOB with increased anterior lean s/t dizziness and difficulty maintaining upright trunk posture   Standing balance  support: Bilateral upper extremity supported, During functional activity, Reliant on assistive device for balance Standing balance-Leahy Scale: Poor Standing balance comment: Upon standing pt displayed pre-syncopal s/sxs and was returned to sitting.                            Communication Communication Communication: Impaired Factors Affecting Communication: Hearing impaired  Cognition Arousal: Alert Behavior During Therapy: WFL for tasks assessed/performed   PT - Cognitive impairments: No apparent impairments                         Following commands: Intact      Cueing Cueing Techniques: Verbal cues  Exercises      General Comments General comments (skin integrity, edema, etc.): See PT comments for BP      Pertinent Vitals/Pain Pain Assessment Pain Assessment: Faces Faces Pain Scale: No hurt Pain Intervention(s): Monitored during session    Home Living                          Prior Function            PT Goals (current goals can now be found in the care plan section) Acute Rehab PT Goals Patient Stated Goal: to improve PT Goal Formulation: With patient Time For Goal Achievement: 04/01/23 Potential to Achieve Goals: Good Progress towards PT goals: Progressing toward goals    Frequency    Min 1X/week      PT Plan      Co-evaluation              AM-PAC PT "6 Clicks" Mobility   Outcome Measure  Help needed turning from your back to your side while in a flat bed without using bedrails?: None Help needed moving from lying on your back to sitting on the side of a flat bed without using bedrails?: None Help needed moving to and from a bed to a chair (including a wheelchair)?: A Little Help needed standing up from a chair using your arms (e.g., wheelchair or bedside chair)?: A Little Help needed to walk in hospital room?: Total Help needed climbing 3-5 steps with a railing? : Total 6 Click Score: 16    End of  Session Equipment Utilized During Treatment: Gait belt Activity Tolerance: Other (comment) (Pt limited by dizziness/lightheadedness) Patient left: in chair;with call bell/phone within reach;with chair alarm set Nurse Communication: Mobility status;Other (comment) (notified nurse and MDs regarding pre-syncopal episodes) PT Visit Diagnosis: Unsteadiness on feet (R26.81);Other abnormalities of gait and mobility (R26.89);Muscle weakness (generalized) (M62.81);Difficulty in walking, not elsewhere classified (R26.2);Dizziness and giddiness (R42)     Time: 1610-9604 PT Time Calculation (min) (ACUTE ONLY): 25 min  Charges:    $Gait Training: 8-22 mins $Therapeutic Activity: 8-22 mins PT General Charges $$ ACUTE PT VISIT: 1 Visit                     [address], SPT    [place] 03/20/2023, 3:26 PM

## 2023-03-20 NOTE — Assessment & Plan Note (Addendum)
-  Tamiflu 30 mg on dialysis days (last dose 3/3)

## 2023-03-21 ENCOUNTER — Inpatient Hospital Stay (HOSPITAL_COMMUNITY): Payer: Medicare Other

## 2023-03-21 ENCOUNTER — Ambulatory Visit: Payer: Self-pay

## 2023-03-21 ENCOUNTER — Encounter (HOSPITAL_COMMUNITY): Payer: Self-pay | Admitting: Family Medicine

## 2023-03-21 DIAGNOSIS — N186 End stage renal disease: Secondary | ICD-10-CM | POA: Diagnosis not present

## 2023-03-21 DIAGNOSIS — J101 Influenza due to other identified influenza virus with other respiratory manifestations: Secondary | ICD-10-CM | POA: Diagnosis not present

## 2023-03-21 DIAGNOSIS — R0902 Hypoxemia: Principal | ICD-10-CM

## 2023-03-21 HISTORY — DX: Hypoxemia: R09.02

## 2023-03-21 LAB — TROPONIN I (HIGH SENSITIVITY)
Troponin I (High Sensitivity): 18 ng/L — ABNORMAL HIGH (ref <18)
Troponin I (High Sensitivity): 17 ng/L (ref <18)

## 2023-03-21 LAB — RENAL FUNCTION PANEL
Albumin: 3.2 g/dL — ABNORMAL LOW (ref 3.5–5.0)
Anion gap: 15 (ref 5–15)
BUN: 54 mg/dL — ABNORMAL HIGH (ref 8–23)
CO2: 24 mmol/L (ref 22–32)
Calcium: 9.7 mg/dL (ref 8.9–10.3)
Chloride: 94 mmol/L — ABNORMAL LOW (ref 98–111)
Creatinine, Ser: 10.23 mg/dL — ABNORMAL HIGH (ref 0.61–1.24)
GFR, Estimated: 5 mL/min — ABNORMAL LOW (ref >60)
Glucose, Bld: 128 mg/dL — ABNORMAL HIGH (ref 70–99)
Phosphorus: 4.5 mg/dL (ref 2.5–4.6)
Potassium: 4.9 mmol/L (ref 3.5–5.1)
Sodium: 133 mmol/L — ABNORMAL LOW (ref 135–145)

## 2023-03-21 LAB — CULTURE, BLOOD (ROUTINE X 2)
Culture: NO GROWTH
Culture: NO GROWTH

## 2023-03-21 LAB — CBC WITH DIFFERENTIAL/PLATELET
Abs Immature Granulocytes: 0.03 10*3/uL (ref 0.00–0.07)
Basophils Absolute: 0 10*3/uL (ref 0.0–0.1)
Basophils Relative: 0 %
Eosinophils Absolute: 0 10*3/uL (ref 0.0–0.5)
Eosinophils Relative: 0 %
HCT: 27.6 % — ABNORMAL LOW (ref 39.0–52.0)
Hemoglobin: 9 g/dL — ABNORMAL LOW (ref 13.0–17.0)
Immature Granulocytes: 1 %
Lymphocytes Relative: 18 %
Lymphs Abs: 0.6 10*3/uL — ABNORMAL LOW (ref 0.7–4.0)
MCH: 31.1 pg (ref 26.0–34.0)
MCHC: 32.6 g/dL (ref 30.0–36.0)
MCV: 95.5 fL (ref 80.0–100.0)
Monocytes Absolute: 0.3 10*3/uL (ref 0.1–1.0)
Monocytes Relative: 10 %
Neutro Abs: 2.4 10*3/uL (ref 1.7–7.7)
Neutrophils Relative %: 71 %
Platelets: 78 10*3/uL — ABNORMAL LOW (ref 150–400)
RBC: 2.89 MIL/uL — ABNORMAL LOW (ref 4.22–5.81)
RDW: 15 % (ref 11.5–15.5)
WBC: 3.3 10*3/uL — ABNORMAL LOW (ref 4.0–10.5)
nRBC: 0.6 % — ABNORMAL HIGH (ref 0.0–0.2)

## 2023-03-21 LAB — MAGNESIUM: Magnesium: 2.3 mg/dL (ref 1.7–2.4)

## 2023-03-21 MED ORDER — IPRATROPIUM-ALBUTEROL 0.5-2.5 (3) MG/3ML IN SOLN
3.0000 mL | RESPIRATORY_TRACT | Status: DC
  Administered 2023-03-21: 3 mL via RESPIRATORY_TRACT

## 2023-03-21 MED ORDER — IPRATROPIUM-ALBUTEROL 0.5-2.5 (3) MG/3ML IN SOLN
3.0000 mL | RESPIRATORY_TRACT | Status: DC
  Administered 2023-03-21 – 2023-03-22 (×3): 3 mL via RESPIRATORY_TRACT
  Filled 2023-03-21 (×2): qty 3

## 2023-03-21 NOTE — Assessment & Plan Note (Addendum)
 Had to increase back to 6L ON (no home O2 requirement) for increased dyspnea and air hunger. Also noted some intermittent chest pain which he describes more as a burning feeling. While in HD, they placed him on CPAP with some desats in the high 80s. Obtained repeat CXR which demonstrated some congestion but no focal consolidation, effusion, or other cause for his worsening symptoms. EKG/trops were unremarkable from prior.  -Continue Tamiflu (discussed below) -Finish Prednisone burst today  -continue DuoNebs q4h  -Supportive care: Tylenol, Mucinex -Continue pulm toilet, spirometry, flutter valve, inhalers  -On 6L [place]; wean O2 as tolerated. Intermittent CPAP as needed and with sleeping -Will consider adding ABG, bipap, and repeat CXR if he does not improve or worsens over the next 24hr  -Will consider addition of ICS for his underlying asthma

## 2023-03-21 NOTE — Progress Notes (Addendum)
 Daily Progress Note Intern Pager: [phone]  Patient name: Kirk [name] Medical record number: [id number] Date of birth: 1960/10/17 Age: 63 y.o. Gender: male  Primary Care Provider: Caesar Bookman, NP Consultants: Nephro, heme/onc Code Status: Full  Pt Overview and Major Events to Date:  02/22 admitted, HD session (late evening)  02/24: echo performed, repeat CXR  02/25: HD session  02/26: MBS performed with no aspiration, orthostatics+   Assessment and Plan:  Kirk [name] is a 63 year old male PMH ESRD on HD (MWF), Alport syndrome s/p kidney transplant, metastatic breast cancer (currently on Enhurtu), chronic pancytopenia, recurrent pneumonia, OSA on CPAP, CAD, mild persistent asthma, systolic heart failure, GI bleed, subdural hematoma, HTN and T2DM admitted for acute hypoxic respiratory failure iso Flu A infection.  Assessment & Plan Acute hypoxic respiratory failure (HCC) Had to increase back to 6L ON (no home O2 requirement) for increased dyspnea and air hunger. Also noted some intermittent chest pain which he describes more as a burning feeling. While in HD, they placed him on CPAP with some desats in the high 80s. Obtained repeat CXR which demonstrated some congestion but no focal consolidation, effusion, or other cause for his worsening symptoms. EKG/trops were unremarkable from prior.  -Continue Tamiflu (discussed below) -Finish Prednisone burst today  -continue DuoNebs q4h  -Supportive care: Tylenol, Mucinex -Continue pulm toilet, spirometry, flutter valve, inhalers  -On 6L [place]; wean O2 as tolerated. Intermittent CPAP as needed and with sleeping -Will consider adding ABG, bipap, and repeat CXR if he does not improve or worsens over the next 24hr  -Will consider addition of ICS for his underlying asthma   Influenza A with pneumonia -Tamiflu 30 mg on dialysis days (last dose 3/3)  ESRD (end stage renal disease) on dialysis [facility]) MWF outpatient scheduled, but is  currently on TTS schedule while inpatient as he refused dialysis on M 02/24. 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 EDW: 88.6, however, he has lost weight recently and this will need to be updated to his lowest weight while he is admitted.  -Nephrology following, appreciate recs -Continue home Phos binders -monitor RFP, Mg -monitor weights  -HD today    Heart failure with mildly reduced ejection fraction (HFmrEF, 41-49%) (HCC) Repeat echo obtained 02/24 with EF worsened to 45-50% and evidence of G2DD. Additionally, aortic valve with moderate stenosis and moderately elevated PA pressure.  -Volume management as above, stable overall. -Continue home Imdur; reduced metop dosing   -Will need cards follow up outpatient for optimization of medical therapy   Orthostatic hypotension Pt was noted to be pre-syncopal appearing while working with OT throughout admission. Orthostatics obtained which demonstrated drop in BP from 143/72->121/53 with sitting to standing. Could be multifactorial given reported poor PO with liquid diet, overall illness presentation, and medication-related given his current hypertensive and heart failure regimens. Reduced metoprolol yesterday.  -Repeat orthostatic vital signs with HR today  -Will consider reduction in doxazosin if he continues to be orthostatic   Pancytopenia (HCC) Chronic, likely in the setting of multiple comorbidities per Heme/Onc. Platelets low but stable on repeat labs. Last Neulasta injection 01/16. Does report some darkening of his stool recently as well but no overt blood.   -Trend CBC w/ diff -Monitor hgb, transfusion threshold >8   -Monitor plts, transfusion threshold >10 -Heme/onc following, plan to monitor as his labs have improved overall.  -Obtain FOBT, if negative will consider GI consult or referral  Dysphagia Reports sensation of choking and globus sensation in chest which is  aggravated with swallowing. Has been a long-standing issue for him; heme/onc discussed g-tube as potential option to supplement nutrition. Mild wt loss since August 2024 (previously 88kg and now 84kg). MBS with SLP was negative to several trials of swallowing. Unclear underlying etiology but suspect he may have some element of baseline dysphagia as well as pulmonary clearing as his infection resolves.  -Trial Protonix 20mg  daily  -obtain DG esophagram    Chronic and Stable Issues: HTN: Continue amlodipine 10 mg daily, hydralazine 100 mg 3 times daily T2DM: Last A1c 5.2 in 01/2023. Diet controlled. Daily fasting CBG Insomnia: Continue doxazosin 8 mg nightly Depression: Continue duloxetine 60 mg daily CAD: Continue ASA 81 mg HLD: Continue pravastatin 40 mg daily OSA: CPAP nightly  FEN/GI: Regular diet with thin liquids  PPx: heparin  Dispo:Home with home health pending clinical improvement . Barriers include pending workup, continued need for O2 support.   Subjective:  NAEON. States he feels worse this morning and it is very hard to catch his breath.   Objective: Temp:  [97.6 F (36.4 C)-98.5 F (36.9 C)] 97.6 F (36.4 C) (02/27 0424) Pulse Rate:  [56-64] 61 (02/27 0424) Resp:  [18-20] 20 (02/27 0029) BP: (116-167)/(55-67) 167/67 (02/27 0424) SpO2:  [86 %-100 %] 86 % (02/27 0424) Weight:  [84.6 kg] 84.6 kg (02/27 0424)  Physical Exam: General: Sitting up eating breakfast in mild distress.  Cardiovascular: RRR. Systolic murmur with transmitted sounds from distal LUE AV fistula.  Respiratory: Satting 94% on 6LNC with increased work of breathing. Speaking in short sentences. Coarse breath sounds throughout all lung fields with crackling that clears with coughing. Mild end expiratory wheezing in all fields.  Abdomen: Soft, non-tender. No distension.  Extremities: Warm. Trace edema bilaterally.   Laboratory: Most recent CBC Lab Results  Component Value Date   WBC 3.3 (L) 03/21/2023    HGB 9.0 (L) 03/21/2023   HCT 27.6 (L) 03/21/2023   MCV 95.5 03/21/2023   PLT 78 (L) 03/21/2023   Most recent BMP    Latest Ref Rng & Units 03/21/2023    2:58 AM  BMP  Glucose 70 - 99 mg/dL 161   BUN 8 - 23 mg/dL 54   Creatinine 0.96 - 1.24 mg/dL 04.54   Sodium 098 - 119 mmol/L 133   Potassium 3.5 - 5.1 mmol/L 4.9   Chloride 98 - 111 mmol/L 94   CO2 22 - 32 mmol/L 24   Calcium 8.9 - 10.3 mg/dL 9.7     Other pertinent labs: mg wnl    Imaging/Diagnostic Tests: DG esophogram, CXR pending   [name], Medical Student 03/21/2023, 8:02 AM [facility] FPTS Intern pager: [phone], text pages welcome Secure chat group [facility] Teaching Service   I was personally present and performed or re-performed the history, physical exam and medical decision making activities of this student/resident and have verified that the student/resident's  findings are accurately documented in the student/resident's note. I have made edits and changes where appropriate, and agree with plan.  Celine Mans, MD, PGY-2 [facility] 12:51 PM 03/21/2023                   03/21/2023, 12:51 PM

## 2023-03-21 NOTE — Patient Outreach (Signed)
 Care Coordination   Chart Review  Visit Note   03/21/2023 Name: AVIK LEONI MRN: [id number] DOB: [birth year]  [name] is a 63 y.o. year old male who sees Ngetich, Dinah C, NP for primary care. I reviewed patient's chart in preparation to contact patient.   What matters to the patients health and wellness today?  NA    Goals Addressed             This Visit's Progress    RN Care Coordination Activities: further follow up needed       Care Coordination Interventions: Reviewed chart in preparation to contact patient, noted patient is currently inpatient at [facility], admit date: 03/16/23-present; dx: Influenza A with Pneumonia         SDOH assessments and interventions completed:  No     Care Coordination Interventions:  Yes, provided   Follow up plan: Follow up call scheduled for pending patient's discharge home     Encounter Outcome:  Patient Visit Completed

## 2023-03-21 NOTE — Procedures (Signed)
 Seen and examined on dialysis.  Blood pressure 155/60 and HR 65.  Tolerating goal.  Left AVF in use.  Procedure supervised.   Estanislado Emms, MD 03/21/2023 11:22 AM

## 2023-03-21 NOTE — Assessment & Plan Note (Addendum)
 Repeat echo obtained 02/24 with EF worsened to 45-50% and evidence of G2DD. Additionally, aortic valve with moderate stenosis and moderately elevated PA pressure.  -Volume management as above, stable overall. -Continue home Imdur; reduced metop dosing   -Will need cards follow up outpatient for optimization of medical therapy

## 2023-03-21 NOTE — Progress Notes (Signed)
   03/21/23 0022  BiPAP/CPAP/SIPAP  Reason BIPAP/CPAP not in use Non-compliant (refused)

## 2023-03-21 NOTE — Assessment & Plan Note (Addendum)
-  Tamiflu 30 mg on dialysis days (last dose 3/3)

## 2023-03-21 NOTE — Assessment & Plan Note (Addendum)
 Reports sensation of choking and globus sensation in chest which is aggravated with swallowing. Has been a long-standing issue for him; heme/onc discussed g-tube as potential option to supplement nutrition. Mild wt loss since August 2024 (previously 88kg and now 84kg). MBS with SLP was negative to several trials of swallowing. Unclear underlying etiology but suspect he may have some element of baseline dysphagia as well as pulmonary clearing as his infection resolves.  -Trial Protonix 20mg  daily  -obtain DG esophagram

## 2023-03-21 NOTE — Progress Notes (Signed)
 OT Cancellation Note  Patient Details Name: Kirk [name] MRN: [id number] DOB: [birth year]   Cancelled Treatment:    Reason Eval/Treat Not Completed: Patient at procedure or test/ unavailable (Pt first was getting lab work completed, now for testing and then going to HD soon per nursing. Will attempt to follow up as soon as possible.) Presley Raddle OTR/L  Acute Rehab Services  [phone] office number   Alphia Moh 03/21/2023, 9:53 AM

## 2023-03-21 NOTE — Progress Notes (Signed)
 Received patient in bed.Awake,alert  and oriented x 4. Consent verified.  Access used: Left arm AVF that worked well.  Duration of treatment: 3.5 hours.  UF goal :  Met 3 liters.  Hemo comment: Tolerated treatment.  Hand off to the patient's nurse: Back into his room with stable medical condition via transporter.

## 2023-03-21 NOTE — Assessment & Plan Note (Addendum)
 MWF outpatient scheduled, but is currently on TTS schedule while inpatient as he refused dialysis on M 02/24. 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 EDW: 88.6, however, he has lost weight recently and this will need to be updated to his lowest weight while he is admitted.  -Nephrology following, appreciate recs -Continue home Phos binders -monitor RFP, Mg -monitor weights  -HD today

## 2023-03-21 NOTE — Assessment & Plan Note (Addendum)
 Pt was noted to be pre-syncopal appearing while working with OT throughout admission. Orthostatics obtained which demonstrated drop in BP from 143/72->121/53 with sitting to standing. Could be multifactorial given reported poor PO with liquid diet, overall illness presentation, and medication-related given his current hypertensive and heart failure regimens. Reduced metoprolol yesterday.  -Repeat orthostatic vital signs with HR today  -Will consider reduction in doxazosin if he continues to be orthostatic

## 2023-03-21 NOTE — Progress Notes (Signed)
 [facility] Progress Note  Subjective: He is charted as refusing BIPAP overnight however is on bipap and seated in a chair this AM on my arrival.  Last HD on 2/25 with 3.5 kg.  Spoke with his nurse and she states they are about to get an EKG on him   Review of systems:    Shortness of breath today - feels like this is worse; some chest discomfort  Denies n/v    Vitals:   03/20/23 1528 03/20/23 2051 03/21/23 0029 03/21/23 0424  BP: (!) 131/59 (!) 156/65 (!) 142/55 (!) 167/67  Pulse: 62 64 61 61  Resp: 18 20 20    Temp: 97.6 F (36.4 C) 97.6 F (36.4 C) 97.6 F (36.4 C) 97.6 F (36.4 C)  TempSrc: Oral  Oral Oral  SpO2: 96% 90% 91% (!) 86%  Weight:    84.6 kg  Height:        Physical Exam:      General adult male in bed in no acute distress HEENT normocephalic atraumatic extraocular movements intact sclera anicteric Neck supple trachea midline Lungs crackles on auscultation bilaterally normal work of breathing at rest on BIPAP Heart S1S2 no rub Abdomen soft nontender nondistended Extremities no lower extremity edema  Psych normal mood and affect Left AVF with bruit and thrill        Renal-related home meds: - norvasc 10 - cardura 8 mg hs - hydralazine 100 tid - metoprolol 100 bid - renavite - renvela 1600 ac tid - velphoro 1 gm ac tid      OP HD:  MWF  [name] EDW 88.6 kg  4h   B450  ml/min DF 700 ml/min  2K/2 Ca bath L AVF  Meds: mircera 150 mcg every 2 weeks (last given 2/19), venofer 50 mg weekly, calcitriol 1.75 mcg three times a week  CXR 2/22 - vascular congestion bilat  CTA chest     Assessment/ Plan: Acute hypoxic resp failure - CXR w/ congestion, poss early edema.  Secondary to a combination of influenza and fluid.  He has improved somewhat with UF but it is not solely due to fluid.   Supportive care for flu A per primary team  Optimize volume status with HD Abx per primary team discretion  Acute flu A infection - s/p  solumedrol. on tamiflu and nebs per primary team   ESRD - on HD MWF usually.  He is off schedule this week after refusing HD on Monday.   HD TTS this week while inpatient.   Will need to ensure that we update his EDW to his lowest post weight here as he appears to have lost weight.  Try 84.9 kg or 84.4 kg HTN - optimize fluid removal with HD.  Reduced hydralazine to 50 mg TID  Anemia of esrd - last outpatient ESA on 2/19. PRBC's per primary team   Secondary hyperparathyroidism - hyperphos improved on velphoro  Alport's syndrome - noted  Dysuria- ordered UA.     Disposition - per primary team     Recent Labs  Lab 03/20/23 0239 03/21/23 0258  HGB 8.8* 9.0*  ALBUMIN 3.1* 3.2*  CALCIUM 9.6 9.7  PHOS 4.4 4.5  CREATININE 7.73* 10.23*  K 4.6 4.9   No results for input(s): "IRON", "TIBC", "FERRITIN" in the last 168 hours. Inpatient medications:  amLODipine  10 mg Oral Daily   aspirin  81 mg Oral Daily   Chlorhexidine Gluconate Cloth  6 each Topical Q0600  Chlorhexidine Gluconate Cloth  6 each Topical Q0600   Chlorhexidine Gluconate Cloth  6 each Topical Q0600   doxazosin  8 mg Oral QHS   DULoxetine  60 mg Oral Daily   guaiFENesin  600 mg Oral BID   heparin  5,000 Units Subcutaneous Q8H   hydrALAZINE  50 mg Oral TID   ipratropium-albuterol  3 mL Nebulization Q4H   isosorbide mononitrate  60 mg Oral Daily   metoprolol tartrate  50 mg Oral BID   mometasone-formoterol  2 puff Inhalation BID   multivitamin  1 tablet Oral Daily   oseltamivir  30 mg Oral Q T,Th,Sat-1800   pantoprazole  20 mg Oral Daily   pravastatin  40 mg Oral Daily   predniSONE  40 mg Oral Q breakfast   sucroferric oxyhydroxide  1,000 mg Oral TID WC      acetaminophen, albuterol, guaiFENesin, ondansetron, traMADol, traZODone     Estanislado Emms, MD 8:29 AM 03/21/2023

## 2023-03-21 NOTE — Assessment & Plan Note (Addendum)
 Chronic, likely in the setting of multiple comorbidities per Heme/Onc. Platelets low but stable on repeat labs. Last Neulasta injection 01/16. Does report some darkening of his stool recently as well but no overt blood.   -Trend CBC w/ diff -Monitor hgb, transfusion threshold >8   -Monitor plts, transfusion threshold >10 -Heme/onc following, plan to monitor as his labs have improved overall.  -Obtain FOBT, if negative will consider GI consult or referral

## 2023-03-21 NOTE — Progress Notes (Signed)
 Speech Language Pathology Treatment: Dysphagia  Patient Details Name: Kirk [name] MRN: [id number] DOB: Nov 14, 1960 Today's Date: 03/21/2023 Time: 4098-1191 SLP Time Calculation (min) (ACUTE ONLY): 8 min  Assessment / Plan / Recommendation Clinical Impression  Pt endorses a globus sensation with trials of thin liquids and solids, which is consistent across SLP visits. No coughing or throat clearance was observed this date. SLP provided educational handout outlining esophageal precautions. Continue regular diet with thin liquids to allow full range of options per pt preference. SLP will sign off.    HPI HPI: Patient is a 63 y.o. male with PMH: ESRD on HD (MWF), Alport syndrome s/p kidney transplant, metastatic breast cancer, chronic pancytopenia, recurrent pneumonia, OSA on CPAP, CAD, mild persistent asthma, systolic heart failure, GI bleed, subdural hematoma, HTN and DM-2. He presented to the hospital on 03/16/23 with acute hypoxic respiratory failure. He was placed on BiPAP morning of 2/23 due to mild increase in WOB/SOB then transitioned to CPAP as needed. He was made NPO awaiting SLP swallow evaluation secondary to concern for aspiration.      SLP Plan  All goals met      Recommendations for follow up therapy are one component of a multi-disciplinary discharge planning process, led by the attending physician.  Recommendations may be updated based on patient status, additional functional criteria and insurance authorization.    Recommendations  Diet recommendations: Regular;Thin liquid Liquids provided via: Cup;Straw Medication Administration: Whole meds with liquid Supervision: Patient able to self feed;Intermittent supervision to cue for compensatory strategies Compensations: Slow rate;Small sips/bites Postural Changes and/or Swallow Maneuvers: Seated upright 90 degrees;Upright 30-60 min after meal                  Oral care BID   PRN Dysphagia, unspecified (R13.10)      All goals met     Gwynneth Aliment, M.A., CF-SLP Speech Language Pathology, Acute Rehabilitation Services  Secure Chat preferred [phone]   03/21/2023, 5:11 PM

## 2023-03-22 ENCOUNTER — Inpatient Hospital Stay (HOSPITAL_COMMUNITY): Payer: Medicare Other

## 2023-03-22 DIAGNOSIS — J101 Influenza due to other identified influenza virus with other respiratory manifestations: Secondary | ICD-10-CM | POA: Diagnosis not present

## 2023-03-22 LAB — CBC WITH DIFFERENTIAL/PLATELET
Abs Immature Granulocytes: 0.05 10*3/uL (ref 0.00–0.07)
Basophils Absolute: 0 10*3/uL (ref 0.0–0.1)
Basophils Relative: 0 %
Eosinophils Absolute: 0 10*3/uL (ref 0.0–0.5)
Eosinophils Relative: 0 %
HCT: 30.1 % — ABNORMAL LOW (ref 39.0–52.0)
Hemoglobin: 10 g/dL — ABNORMAL LOW (ref 13.0–17.0)
Immature Granulocytes: 1 %
Lymphocytes Relative: 13 %
Lymphs Abs: 0.7 10*3/uL (ref 0.7–4.0)
MCH: 31.5 pg (ref 26.0–34.0)
MCHC: 33.2 g/dL (ref 30.0–36.0)
MCV: 95 fL (ref 80.0–100.0)
Monocytes Absolute: 0.5 10*3/uL (ref 0.1–1.0)
Monocytes Relative: 9 %
Neutro Abs: 4.1 10*3/uL (ref 1.7–7.7)
Neutrophils Relative %: 77 %
Platelets: 101 10*3/uL — ABNORMAL LOW (ref 150–400)
RBC: 3.17 MIL/uL — ABNORMAL LOW (ref 4.22–5.81)
RDW: 15.4 % (ref 11.5–15.5)
WBC: 5.3 10*3/uL (ref 4.0–10.5)
nRBC: 0 % (ref 0.0–0.2)

## 2023-03-22 LAB — RENAL FUNCTION PANEL
Albumin: 3.2 g/dL — ABNORMAL LOW (ref 3.5–5.0)
Anion gap: 14 (ref 5–15)
BUN: 37 mg/dL — ABNORMAL HIGH (ref 8–23)
CO2: 27 mmol/L (ref 22–32)
Calcium: 9.7 mg/dL (ref 8.9–10.3)
Chloride: 95 mmol/L — ABNORMAL LOW (ref 98–111)
Creatinine, Ser: 8.28 mg/dL — ABNORMAL HIGH (ref 0.61–1.24)
GFR, Estimated: 7 mL/min — ABNORMAL LOW (ref >60)
Glucose, Bld: 103 mg/dL — ABNORMAL HIGH (ref 70–99)
Phosphorus: 4.2 mg/dL (ref 2.5–4.6)
Potassium: 4.4 mmol/L (ref 3.5–5.1)
Sodium: 136 mmol/L (ref 135–145)

## 2023-03-22 LAB — MAGNESIUM: Magnesium: 2.3 mg/dL (ref 1.7–2.4)

## 2023-03-22 MED ORDER — DOXAZOSIN MESYLATE 4 MG PO TABS
4.0000 mg | ORAL_TABLET | Freq: Every day | ORAL | Status: DC
  Administered 2023-03-22: 4 mg via ORAL
  Filled 2023-03-22 (×2): qty 1

## 2023-03-22 MED ORDER — CHLORHEXIDINE GLUCONATE CLOTH 2 % EX PADS
6.0000 | MEDICATED_PAD | Freq: Every day | CUTANEOUS | Status: DC
  Administered 2023-03-23: 6 via TOPICAL

## 2023-03-22 MED ORDER — FOSFOMYCIN TROMETHAMINE 3 G PO PACK
3.0000 g | PACK | Freq: Once | ORAL | Status: AC
  Administered 2023-03-22: 3 g via ORAL
  Filled 2023-03-22: qty 3

## 2023-03-22 MED ORDER — PANTOPRAZOLE SODIUM 40 MG PO TBEC
40.0000 mg | DELAYED_RELEASE_TABLET | Freq: Every day | ORAL | Status: DC
  Administered 2023-03-23: 40 mg via ORAL
  Filled 2023-03-22: qty 1

## 2023-03-22 MED ORDER — UMECLIDINIUM BROMIDE 62.5 MCG/ACT IN AEPB
1.0000 | INHALATION_SPRAY | Freq: Every day | RESPIRATORY_TRACT | Status: DC
Start: 2023-03-22 — End: 2023-03-23
  Filled 2023-03-22: qty 7

## 2023-03-22 MED ORDER — IPRATROPIUM-ALBUTEROL 0.5-2.5 (3) MG/3ML IN SOLN
3.0000 mL | Freq: Four times a day (QID) | RESPIRATORY_TRACT | Status: DC
  Administered 2023-03-22 – 2023-03-23 (×3): 3 mL via RESPIRATORY_TRACT
  Filled 2023-03-22 (×4): qty 3

## 2023-03-22 NOTE — Assessment & Plan Note (Addendum)
 Pt was noted to be pre-syncopal appearing while working with OT throughout admission.  Repeat Orthostatics obtained bedside, which demonstrated drop in BP from 141/58->125/53 with lying to sitting. Sitting to standing drop to 108/50. Pulse steady 69-70. Could be multifactorial given reported poor PO with liquid diet, overall illness presentation, and medication-related given his current hypertensive and heart failure regimens. Reduced metoprolol on 2/26.  -Will reduce doxazosin 4 mg currently - Encourage behavioral modification to limit exacerbation of symptoms

## 2023-03-22 NOTE — Progress Notes (Incomplete)
Day of dialysis orders placed. Contacted hemodialysis; representative stated that these orders have been placed in anticipation of first shift dialysis on AM of 3/1. Antihypertensives will be given accordingly.

## 2023-03-22 NOTE — Assessment & Plan Note (Addendum)
 Reports sensation of choking and globus sensation in chest which is aggravated with swallowing. Has been a long-standing issue for him; heme/onc discussed g-tube as potential option to supplement nutrition. Mild wt loss since August 2024 (previously 88kg and now 84kg). MBS with SLP was negative to several trials of swallowing. Unclear underlying etiology but suspect he may have some element of baseline dysphagia as well as pulmonary clearing as his infection resolves.  -SLP evaluated continue a regular diet with thin liquids, signed off, appreciate Rex - Increase Protonix 40 mg daily - Follow-up with GI outpatient

## 2023-03-22 NOTE — Assessment & Plan Note (Signed)
 Chronic, likely in the setting of multiple comorbidities per Heme/Onc. Platelets low but stable on repeat labs. Last Neulasta injection 01/16. Does report some darkening of his stool recently as well but no overt blood.   -Trend CBC w/ diff -Monitor hgb, transfusion threshold >8   -Monitor plts, transfusion threshold >10 -Heme/onc following, plan to monitor as his labs have improved overall.  - Collect FOBT, if negative will consider GI consult or referral

## 2023-03-22 NOTE — Assessment & Plan Note (Addendum)
 Patient gradually weaning down overnight and was on room air during her discussion. Continues to report some sob with movement and intermittently during conversation.  Was compliant with CPAP overnight and was satting on 3 L of nasal cannula in the low 90s.  -Continue Tamiflu (discussed below) -Completed steroid burst on 2/27 -Start Incruse Ellipta -continue DuoNebs q4h  -Supportive care: Tylenol, Mucinex -Continue pulm toilet, spirometry, flutter valve, inhalers  -On 3L [place]; wean O2 as tolerated. Intermittent CPAP as needed and with sleeping

## 2023-03-22 NOTE — Progress Notes (Addendum)
 Daily Progress Note Intern Pager: [phone]  Patient name: Kirk [name] Medical record number: [id number] Date of birth: December 02, 1960 Age: 63 y.o. Gender: male  Primary Care Provider: Caesar Bookman, NP Consultants: Nephro, heme/onc Code Status: Full   Pt Overview and Major Events to Date:  02/22 admitted, HD session (late evening)  02/24: echo performed, repeat CXR  02/25: HD session  02/26: MBS performed with no aspiration, orthostatics+    Assessment and Plan:   Kirk [name] is a 63 year old male PMH ESRD on HD (MWF), Alport syndrome s/p kidney transplant, metastatic breast cancer (currently on Enhurtu), chronic pancytopenia, recurrent pneumonia, OSA on CPAP, CAD, mild persistent asthma, systolic heart failure, GI bleed, subdural hematoma, HTN and T2DM admitted for acute hypoxic respiratory failure iso Flu A infection.  Assessment & Plan Acute hypoxic respiratory failure (HCC) Patient gradually weaning down overnight and was on room air during her discussion. Continues to report some sob with movement and intermittently during conversation.  Was compliant with CPAP overnight and was satting on 3 L of nasal cannula in the low 90s.  -Continue Tamiflu (discussed below) -Completed steroid burst on 2/27 -Start Incruse Ellipta -continue DuoNebs q4h  -Supportive care: Tylenol, Mucinex -Continue pulm toilet, spirometry, flutter valve, inhalers  -On 3L [place]; wean O2 as tolerated. Intermittent CPAP as needed and with sleeping Influenza A with pneumonia -Tamiflu 30 mg on dialysis days (last dose 3/3) ESRD (end stage renal disease) on dialysis [facility]) MWF outpatient scheduled, but is currently on TTS schedule while inpatient as he refused dialysis on M 02/24.  The patient tolerated hemodialysis without complications yesterday and 3 L were removed. 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 EDW: 88.6, however, he has lost weight  recently and this will need to be updated to his lowest weight while he is admitted.  -Nephrology following, appreciate recs -Continue home Phos binders -monitor RFP, Mg -monitor weights  - Fosfomycin x1, patient describes symptoms of urinary infection, however unable to provide adequate urinary sample for UA and only makes drops of urine, will treat symptomatically   Heart failure with mildly reduced ejection fraction (HFmrEF, 41-49%) (HCC) Repeat echo obtained 02/24 with EF worsened to 45-50% and evidence of G2DD. Additionally, aortic valve with moderate stenosis and moderately elevated PA pressure.  -Volume management as above, stable overall. -Continue home Imdur; reduced metop dosing 50 mg BID   -Will need cards follow up outpatient for optimization of medical therapy   Orthostatic hypotension Pt was noted to be pre-syncopal appearing while working with OT throughout admission.  Repeat Orthostatics obtained bedside, which demonstrated drop in BP from 141/58->125/53 with lying to sitting. Sitting to standing drop to 108/50. Pulse steady 69-70. Could be multifactorial given reported poor PO with liquid diet, overall illness presentation, and medication-related given his current hypertensive and heart failure regimens. Reduced metoprolol on 2/26.  -Will reduce doxazosin 4 mg currently - Encourage behavioral modification to limit exacerbation of symptoms  Pancytopenia (HCC) Chronic, likely in the setting of multiple comorbidities per Heme/Onc. Platelets low but stable on repeat labs. Last Neulasta injection 01/16. Does report some darkening of his stool recently as well but no overt blood.   -Trend CBC w/ diff -Monitor hgb, transfusion threshold >8   -Monitor plts, transfusion threshold >10 -Heme/onc following, plan to monitor as his labs have improved overall.  - Collect FOBT, if negative will consider GI consult or referral    Dysphagia Reports  sensation of choking and globus sensation  in chest which is aggravated with swallowing. Has been a long-standing issue for him; heme/onc discussed g-tube as potential option to supplement nutrition. Mild wt loss since August 2024 (previously 88kg and now 84kg). MBS with SLP was negative to several trials of swallowing. Unclear underlying etiology but suspect he may have some element of baseline dysphagia as well as pulmonary clearing as his infection resolves.  -SLP evaluated continue a regular diet with thin liquids, signed off, appreciate Rex - Increase Protonix 40 mg daily - Follow-up with GI outpatient    Chronic and Stable Issues: HTN: Continue amlodipine 10 mg daily, hydralazine 100 mg 3 times daily,  T2DM: Last A1c 5.2 in 01/2023. Diet controlled. Daily fasting CBG Depression: Continue duloxetine 60 mg daily CAD: Continue ASA 81 mg HLD: Continue pravastatin 40 mg daily OSA: CPAP nightly   FEN/GI: Regular diet with thin liquids  PPx: heparin  Dispo:Home with home health pending clinical improvement . Barriers include pending workup, continued need for O2 support.    Subjective:  No acute events overnight.  Patient still experiencing some shortness of breath with exertion.  No issues with hemodialysis yesterday.  Objective: Temp:  [97.3 F (36.3 C)-98.6 F (37 C)] 98 F (36.7 C) (02/28 0737) Pulse Rate:  [59-100] 65 (02/28 0737) Resp:  [17-24] 18 (02/28 0737) BP: (117-175)/(53-92) 134/61 (02/28 0737) SpO2:  [80 %-97 %] 93 % (02/28 0737) FiO2 (%):  [30 %] 30 % (02/27 2216) Weight:  [79.3 kg-83.4 kg] 83.4 kg (02/28 0424) Physical Exam: General: Sitting up in chair after vitals collected, mild distress.  Cardiovascular: RRR. Systolic murmur with transmitted sounds from distal LUE AV fistula.  Respiratory: On room air with increased work of breathing. Speaking in short sentences. Coarse breath sounds throughout all lung fields with crackling that clears with coughing. Mild end expiratory wheezing in all fields.  Abdomen:  Soft, non-tender. No distension.  Extremities: Warm. Trace edema bilaterally  Laboratory: Most recent CBC Lab Results  Component Value Date   WBC 5.3 03/22/2023   HGB 10.0 (L) 03/22/2023   HCT 30.1 (L) 03/22/2023   MCV 95.0 03/22/2023   PLT 101 (L) 03/22/2023   Most recent BMP    Latest Ref Rng & Units 03/22/2023    2:32 AM  BMP  Glucose 70 - 99 mg/dL 119   BUN 8 - 23 mg/dL 37   Creatinine 1.47 - 1.24 mg/dL 8.29   Sodium 562 - 130 mmol/L 136   Potassium 3.5 - 5.1 mmol/L 4.4   Chloride 98 - 111 mmol/L 95   CO2 22 - 32 mmol/L 27   Calcium 8.9 - 10.3 mg/dL 9.7    Other pertinent labs:  FOBT needs collecting    Imaging/Diagnostic Tests: None last 24 hours  Peterson Ao, MD 03/22/2023, 7:41 AM  PGY-1, [facility] FPTS Intern pager: [phone], text pages welcome Secure chat group [facility] Teaching Service

## 2023-03-22 NOTE — Consult Note (Signed)
 [facility] Liaison Consult Note    03/22/2023  Kirk [name] [birth year] [id number]  [facility] Patient:  Active with  Primary Care Provider:  Caesar Bookman, NP with [facility], this provider is listed to provide the community transition of care follow up and TOC calls  *Patient is at [facility] admitted with Influenza A with pneumonia and on droplet precaution on unit rounds, PPE preserved, wife in room, and answered,  spoke with patient via phone, states he is having difficulty with his blood pressure and he can stand up very long, shortness of breath continues.   Insurance: Medicare ACO REACH  Patient is currently active with [facility] for care coordination services.  Patient has been engaged by a Engineer, manufacturing systems for food resources.  The community based plan of care has focused on disease management and community resource support.    Plan: Continue to follow for any additional community care coordination needs for post hospital/community needs.   Of note, [facility] services does not replace or interfere with any services that are needed or arranged by inpatient [facility] care management team.   Charlesetta Shanks, RN, BSN, CCM [place]  [facility] [place], [facility] Liaison Direct Dial: [phone] or secure chat Email: [email or web address]

## 2023-03-22 NOTE — Progress Notes (Signed)
 [facility] Progress Note  Subjective: Last HD on 2/27 with 3 kg UF.  Spoke with his wife at bedside.  He tells me he had a swallowing test and that food is getting stuck.  States oxygen was turned up for test?   Review of systems:    Shortness of breath  Swallowing issues as above  Denies n/v  Feels like he has lost weight   Vitals:   03/22/23 0846 03/22/23 0848 03/22/23 0850 03/22/23 1127  BP:  (!) 141/58 (!) 125/53 (!) 120/51  Pulse: 71 72 69 65  Resp:    18  Temp:    98.2 F (36.8 C)  TempSrc:    Oral  SpO2: (!) 89% 91% 93% 97%  Weight:      Height:        Physical Exam:     General adult male in bed in no acute distress HEENT normocephalic atraumatic extraocular movements intact sclera anicteric Neck supple trachea midline Lungs crackles bilaterally normal work of breathing at rest on 6 liters oxygen  Heart S1S2 no rub Abdomen soft nontender nondistended Extremities no lower extremity edema  Psych normal mood and affect Left AVF with bruit and thrill        Renal-related home meds: - norvasc 10 - cardura 8 mg hs - hydralazine 100 tid - metoprolol 100 bid - renavite - renvela 1600 ac tid - velphoro 1 gm ac tid      OP HD:  MWF  Kirk [name] EDW 88.6 kg  4h   B450  ml/min DF 700 ml/min  2K/2 Ca bath L AVF  Meds: mircera 150 mcg every 2 weeks (last given 2/19), venofer 50 mg weekly, calcitriol 1.75 mcg three times a week  CXR 2/22 - vascular congestion bilat  CTA chest     Assessment/ Plan: Acute hypoxic resp failure - CXR w/ congestion, poss early edema.  Secondary to a combination of influenza and fluid.  He has improved somewhat with UF but it is not solely due to fluid.   Supportive care for flu A per primary team  Optimize volume status with HD Abx per primary team discretion  Acute flu A infection - s/p solumedrol. S/p tamiflu.  nebs per primary team   ESRD - on HD MWF usually.  He is off schedule this week after refusing  HD on Monday.   HD TTS this week while inpatient.  Then back to MWF schedule next week  Will need to ensure that we update his EDW to his lowest post weight here as he appears to have lost weight.  Try 83.5 kg for EDW HTN - optimize fluid removal with HD.  Reduced hydralazine to 50 mg TID  Anemia of esrd - last outpatient ESA on 2/19. Hb improving. PRBC's per primary team   Secondary hyperparathyroidism - hyperphos improved on velphoro  Alport's syndrome - noted     Disposition - per primary team     Recent Labs  Lab 03/21/23 0258 03/22/23 0232  HGB 9.0* 10.0*  ALBUMIN 3.2* 3.2*  CALCIUM 9.7 9.7  PHOS 4.5 4.2  CREATININE 10.23* 8.28*  K 4.9 4.4   No results for input(s): "IRON", "TIBC", "FERRITIN" in the last 168 hours. Inpatient medications:  amLODipine  10 mg Oral Daily   aspirin  81 mg Oral Daily   Chlorhexidine Gluconate Cloth  6 each Topical Q0600   doxazosin  4 mg Oral QHS   DULoxetine  60 mg Oral Daily  guaiFENesin  600 mg Oral BID   heparin  5,000 Units Subcutaneous Q8H   hydrALAZINE  50 mg Oral TID   ipratropium-albuterol  3 mL Nebulization Q6H   isosorbide mononitrate  60 mg Oral Daily   metoprolol tartrate  50 mg Oral BID   mometasone-formoterol  2 puff Inhalation BID   multivitamin  1 tablet Oral Daily   [START ON 03/23/2023] pantoprazole  40 mg Oral Daily   pravastatin  40 mg Oral Daily   sucroferric oxyhydroxide  1,000 mg Oral TID WC   umeclidinium bromide  1 puff Inhalation Daily      acetaminophen, albuterol, guaiFENesin, ondansetron, traMADol, traZODone     Estanislado Emms, MD 2:00 PM 03/22/2023

## 2023-03-22 NOTE — Assessment & Plan Note (Addendum)
 MWF outpatient scheduled, but is currently on TTS schedule while inpatient as he refused dialysis on M 02/24.  The patient tolerated hemodialysis without complications yesterday and 3 L were removed. 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 EDW: 88.6, however, he has lost weight recently and this will need to be updated to his lowest weight while he is admitted.  -Nephrology following, appreciate recs -Continue home Phos binders -monitor RFP, Mg -monitor weights  - Fosfomycin x1, patient describes symptoms of urinary infection, however unable to provide adequate urinary sample for UA and only makes drops of urine, will treat symptomatically

## 2023-03-22 NOTE — Assessment & Plan Note (Signed)
 Repeat echo obtained 02/24 with EF worsened to 45-50% and evidence of G2DD. Additionally, aortic valve with moderate stenosis and moderately elevated PA pressure.  -Volume management as above, stable overall. -Continue home Imdur; reduced metop dosing 50 mg BID   -Will need cards follow up outpatient for optimization of medical therapy

## 2023-03-22 NOTE — Assessment & Plan Note (Addendum)
-  Tamiflu 30 mg on dialysis days (last dose 3/3)

## 2023-03-22 NOTE — Progress Notes (Signed)
 Physical Therapy Treatment Patient Details Name: Kirk [name] MRN: [id number] DOB: [birth year] Today's Date: 03/22/2023   History of Present Illness Pt is 63 year old presented to [facility] on  03/16/23 for SOB and feeling of being filled with fluid even after HD.  Pt with PNA , flu A, and hypoxic respiratory failure. PMH - ESRD on HD, failed kidney transplant, htn, anxiety, OSA, SDH, GI bleed, metastatic breast cancer, pancytopenia, seizure    PT Comments  Session focused on therex to promote global LE strengthening and AROM and theract to promote functional mobility. Pt continues to have difficulty with positional changes as evident in  increased dizziness/lightheadedness and reduction of BP. Additionally, pt reported chest, abdominal, and L arm pain after standing that has been occurring intermittently throughout the day. Standing heel raises and lateral swaying was trialed to which saw no increase in standing BP. Given the pt's motivation to improve, family support, current medical condition limited by orthostatic hypotension, and PLOF the pt would benefit from an updated d/c plan to intense inpatient rehab > 3 hours and potential trials of TED hose and/or abdominal binders. Will continue to follow acutely.  Pt's Orthostatics Seated beginning: 143/64 (HR- 62) Initial standing: 115/50 Standing with dynamic LE movements: 108/49 Seated: 124/60 (HR- 62) Seated end: 130/60 (HR- 61)   If plan is discharge home, recommend the following: A lot of help with walking and/or transfers;A lot of help with bathing/dressing/bathroom;Assistance with cooking/housework;Assist for transportation;Help with stairs or ramp for entrance   Can travel by private vehicle        Equipment Recommendations  Rollator (4 wheels) (pending progress)    Recommendations for Other Services Rehab consult     Precautions / Restrictions Precautions Precautions: Fall Recall of Precautions/Restrictions:  Intact Precaution/Restrictions Comments: watch BP and SpO2 Restrictions Weight Bearing Restrictions Per Provider Order: No     Mobility  Bed Mobility               General bed mobility comments: Pt in recliner upon entry. Pt returned to recliner at EOS    Transfers Overall transfer level: Needs assistance Equipment used: Rolling walker (2 wheels) Transfers: Sit to/from Stand Sit to Stand: Contact guard assist           General transfer comment: CGA for safety as pt reports dizziness/lightheadedness upon standing. Pt stands with increased fwd trunk posture and requires additional time. Pt requires cueing for hand placement to recliner arm rest upon sitting.    Ambulation/Gait               General Gait Details: Not assessed this session s/t dizziness/lightheadedness   Stairs             Wheelchair Mobility     Tilt Bed    Modified Rankin (Stroke Patients Only)       Balance Overall balance assessment: Needs assistance Sitting-balance support: No upper extremity supported, Feet supported Sitting balance-Leahy Scale: Good Sitting balance - Comments: Prior to mobility, pt sits in recliner with no directional lean or LOB. After mobility, pt sits in recliner/EOB with increased anterior lean s/t dizziness and difficulty maintaining upright trunk posture Postural control: Other (comment) (anterior lean) Standing balance support: Bilateral upper extremity supported, During functional activity, Reliant on assistive device for balance Standing balance-Leahy Scale: Poor Standing balance comment: Pt reliant on RW for static standing and tends to lean posteriorly once dizziness/lightheadedness begins.  Communication Communication Communication: Impaired Factors Affecting Communication: Hearing impaired  Cognition Arousal: Alert Behavior During Therapy: WFL for tasks assessed/performed   PT - Cognitive impairments: No  apparent impairments                         Following commands: Intact      Cueing Cueing Techniques: Verbal cues  Exercises General Exercises - Lower Extremity Long Arc Quad: Both, AROM, 15 reps, Seated Hip Flexion/Marching: Both, 10 reps, Seated, Standing Heel Raises: Strengthening, Both, 10 reps, Standing Mini-Sqauts: Strengthening, Seated (1 mini squat held for 3 secs with bil UEs support on chair to assist in powering up & holding himself)    General Comments General comments (skin integrity, edema, etc.): See PT comments for Orthostatics      Pertinent Vitals/Pain Pain Assessment Pain Assessment: Faces Faces Pain Scale: Hurts even more Pain Location: chest, R arm, and abdomen Pain Descriptors / Indicators: Grimacing, Guarding Pain Intervention(s): Monitored during session, Limited activity within patient's tolerance    Home Living Family/patient expects to be discharged to:: Private residence                        Prior Function            PT Goals (current goals can now be found in the care plan section) Acute Rehab PT Goals PT Goal Formulation: With patient Time For Goal Achievement: 04/01/23 Potential to Achieve Goals: Fair Progress towards PT goals: Not progressing toward goals - comment    Frequency    Min 1X/week      PT Plan      Co-evaluation              AM-PAC PT "6 Clicks" Mobility   Outcome Measure  Help needed turning from your back to your side while in a flat bed without using bedrails?: None Help needed moving from lying on your back to sitting on the side of a flat bed without using bedrails?: None Help needed moving to and from a bed to a chair (including a wheelchair)?: A Little Help needed standing up from a chair using your arms (e.g., wheelchair or bedside chair)?: A Little Help needed to walk in hospital room?: Total Help needed climbing 3-5 steps with a railing? : Total 6 Click Score: 16    End  of Session Equipment Utilized During Treatment: Gait belt;Oxygen Activity Tolerance: Other (comment) (Pt limited by dizziness/lightheadedness) Patient left: in chair;with call bell/phone within reach;with chair alarm set;with family/visitor present   PT Visit Diagnosis: Unsteadiness on feet (R26.81);Other abnormalities of gait and mobility (R26.89);Muscle weakness (generalized) (M62.81);Difficulty in walking, not elsewhere classified (R26.2);Dizziness and giddiness (R42)     Time: 4098-1191 PT Time Calculation (min) (ACUTE ONLY): 28 min  Charges:    $Therapeutic Exercise: 8-22 mins $Therapeutic Activity: 8-22 mins PT General Charges $$ ACUTE PT VISIT: 1 Visit                     [address], SPT    [place] 03/22/2023, 5:39 PM

## 2023-03-23 ENCOUNTER — Other Ambulatory Visit (HOSPITAL_COMMUNITY): Payer: Self-pay

## 2023-03-23 DIAGNOSIS — T8612 Kidney transplant failure: Secondary | ICD-10-CM | POA: Diagnosis not present

## 2023-03-23 DIAGNOSIS — Z992 Dependence on renal dialysis: Secondary | ICD-10-CM | POA: Diagnosis not present

## 2023-03-23 DIAGNOSIS — N186 End stage renal disease: Secondary | ICD-10-CM | POA: Diagnosis not present

## 2023-03-23 DIAGNOSIS — J9601 Acute respiratory failure with hypoxia: Secondary | ICD-10-CM | POA: Diagnosis not present

## 2023-03-23 LAB — RENAL FUNCTION PANEL
Albumin: 3.2 g/dL — ABNORMAL LOW (ref 3.5–5.0)
Anion gap: 15 (ref 5–15)
BUN: 50 mg/dL — ABNORMAL HIGH (ref 8–23)
CO2: 24 mmol/L (ref 22–32)
Calcium: 9.3 mg/dL (ref 8.9–10.3)
Chloride: 95 mmol/L — ABNORMAL LOW (ref 98–111)
Creatinine, Ser: 10.34 mg/dL — ABNORMAL HIGH (ref 0.61–1.24)
GFR, Estimated: 5 mL/min — ABNORMAL LOW (ref >60)
Glucose, Bld: 105 mg/dL — ABNORMAL HIGH (ref 70–99)
Phosphorus: 4.9 mg/dL — ABNORMAL HIGH (ref 2.5–4.6)
Potassium: 4.1 mmol/L (ref 3.5–5.1)
Sodium: 134 mmol/L — ABNORMAL LOW (ref 135–145)

## 2023-03-23 LAB — CBC
HCT: 30.5 % — ABNORMAL LOW (ref 39.0–52.0)
Hemoglobin: 10.1 g/dL — ABNORMAL LOW (ref 13.0–17.0)
MCH: 32.1 pg (ref 26.0–34.0)
MCHC: 33.1 g/dL (ref 30.0–36.0)
MCV: 96.8 fL (ref 80.0–100.0)
Platelets: 86 10*3/uL — ABNORMAL LOW (ref 150–400)
RBC: 3.15 MIL/uL — ABNORMAL LOW (ref 4.22–5.81)
RDW: 16.1 % — ABNORMAL HIGH (ref 11.5–15.5)
WBC: 4.5 10*3/uL (ref 4.0–10.5)
nRBC: 0 % (ref 0.0–0.2)

## 2023-03-23 LAB — MAGNESIUM: Magnesium: 2.2 mg/dL (ref 1.7–2.4)

## 2023-03-23 MED ORDER — HYDRALAZINE HCL 50 MG PO TABS
50.0000 mg | ORAL_TABLET | Freq: Three times a day (TID) | ORAL | 0 refills | Status: DC
  Filled 2023-03-23: qty 90, 30d supply, fill #0

## 2023-03-23 MED ORDER — LIDOCAINE-PRILOCAINE 2.5-2.5 % EX CREA: 1.0000 | TOPICAL_CREAM | CUTANEOUS | Status: DC | PRN

## 2023-03-23 MED ORDER — FLUTICASONE FUROATE-VILANTEROL 200-25 MCG/ACT IN AEPB
1.0000 | INHALATION_SPRAY | Freq: Two times a day (BID) | RESPIRATORY_TRACT | 0 refills | Status: DC
  Filled 2023-03-23: qty 28, 14d supply, fill #0

## 2023-03-23 MED ORDER — METOPROLOL TARTRATE 50 MG PO TABS
50.0000 mg | ORAL_TABLET | Freq: Two times a day (BID) | ORAL | 0 refills | Status: DC
  Filled 2023-03-23: qty 60, 30d supply, fill #0

## 2023-03-23 MED ORDER — PENTAFLUOROPROP-TETRAFLUOROETH EX AERO: 1.0000 | INHALATION_SPRAY | CUTANEOUS | Status: DC | PRN

## 2023-03-23 MED ORDER — NEPRO/CARBSTEADY PO LIQD: 237.0000 mL | ORAL | Status: DC | PRN

## 2023-03-23 MED ORDER — ANTICOAGULANT SODIUM CITRATE 4% (200MG/5ML) IV SOLN: 5.0000 mL | Status: DC | PRN

## 2023-03-23 MED ORDER — DOXAZOSIN MESYLATE 4 MG PO TABS
4.0000 mg | ORAL_TABLET | Freq: Every day | ORAL | 0 refills | Status: DC
  Filled 2023-03-23: qty 30, 30d supply, fill #0

## 2023-03-23 MED ORDER — LIDOCAINE HCL (PF) 1 % IJ SOLN: 5.0000 mL | INTRAMUSCULAR | Status: DC | PRN

## 2023-03-23 MED ORDER — UMECLIDINIUM BROMIDE 62.5 MCG/ACT IN AEPB
1.0000 | INHALATION_SPRAY | Freq: Every day | RESPIRATORY_TRACT | 0 refills | Status: DC
  Filled 2023-03-23: qty 30, 30d supply, fill #0

## 2023-03-23 MED ORDER — PANTOPRAZOLE SODIUM 40 MG PO TBEC
40.0000 mg | DELAYED_RELEASE_TABLET | Freq: Every day | ORAL | 0 refills | Status: DC
  Filled 2023-03-23: qty 30, 30d supply, fill #0

## 2023-03-23 MED ORDER — HEPARIN SODIUM (PORCINE) 1000 UNIT/ML DIALYSIS: 1000.0000 [IU] | INTRAMUSCULAR | Status: DC | PRN

## 2023-03-23 NOTE — Progress Notes (Signed)
 Physical Therapy Treatment Patient Details Name: Kirk [name] MRN: [id number] DOB: 1960/08/29 Today's Date: 03/23/2023   History of Present Illness Pt is 63 year old presented to [facility] on  03/16/23 for SOB and feeling of being filled with fluid even after HD.  Pt with PNA , flu A, and hypoxic respiratory failure. PMH - ESRD on HD, failed kidney transplant, htn, anxiety, OSA, SDH, GI bleed, metastatic breast cancer, pancytopenia, seizure    PT Comments  Pt received sitting in chair, agreeable to therapy session with encouragement, pt eager for discharge and with decreased insight into safety/deficits. Pt with continued symptomatic orthostatic hypotension and c/o fatigue after recent return from hemodialysis dept but with good participation as able. Pt BP readings below, RN/MD notified of symptomatic drop in BP despite abdominal binder and TED hose donned; DME updated to recommend wheelchair as per case mgmt, pt not yet eligible for rollator as he got a RW in 2023. Discussed DME recommendation update with supervising PT Beth [facility] and updated below, pt agreeable for it to be sent to home and RN to notify case mgr. Orthostatic Lying   BP- Lying 126/59 (recliner with abd binder and TED hose donned)  Pulse- Lying 68  Orthostatic Sitting  BP- Sitting 117/64 (with abd binder and TED hose donned)  Pulse- Sitting 70  Orthostatic Standing at 0 minutes  BP- Standing at 0 minutes 95/61 (with abd binder and TED hose donned)  Pulse- Standing at 0 minutes 71  Orthostatic Standing at 4 minutes  BP- Standing at 3 minutes 130/60 (post-ambulation) (with abd binder and TED hose donned)  Pulse- Standing at 3 minutes 73     If plan is discharge home, recommend the following: A lot of help with walking and/or transfers;A lot of help with bathing/dressing/bathroom;Assistance with cooking/housework;Assist for transportation;Help with stairs or ramp for entrance   Can travel by private vehicle        Equipment  Recommendations  Wheelchair (measurements PT);Wheelchair cushion (measurements PT)    Recommendations for Other Services       Precautions / Restrictions Precautions Precautions: Fall Recall of Precautions/Restrictions: Intact Precaution/Restrictions Comments: watch BP and SpO2 Restrictions Weight Bearing Restrictions Per Provider Order: No     Mobility  Bed Mobility               General bed mobility comments: Pt received sitting EOB. Pt returned to recliner at EOS    Transfers Overall transfer level: Needs assistance Equipment used: Rolling walker (2 wheels) Transfers: Sit to/from Stand Sit to Stand: Supervision, Contact guard assist           General transfer comment: CGA for safety for stand>sit due to orthostatic symptoms.  Pt stands with increased fwd trunk posture and requires additional time. Pt requires cueing for hand placement to recliner arm rest upon sitting. Supervision for sit>stand with safety cues to RW.    Ambulation/Gait Ambulation/Gait assistance: Contact guard assist, +2 safety/equipment Gait Distance (Feet): 25 Feet (40ft, seated break, 12ft, seated break, 28ft) Assistive device: Rolling walker (2 wheels) Gait Pattern/deviations: Step-through pattern, Decreased stride length, Drifts right/left, Trunk flexed (downward gaze)       General Gait Details: Decreased step length, pt needs cues and CGA for safety managing RW in narrow areas of the room, decreased insight into safety. BP improved post-exertion standing at chair prior to pt taking seated break with TED hose and abd binder in place. Pt c/o abdominal pain   Stairs Stairs:  (pt defers)  Wheelchair Mobility     Tilt Bed    Modified Rankin (Stroke Patients Only)       Balance Overall balance assessment: Needs assistance Sitting-balance support: No upper extremity supported, Feet supported Sitting balance-Leahy Scale: Good Sitting balance - Comments: Prior to  mobility, pt sits in recliner with no directional lean or LOB. After mobility, pt sits in recliner/EOB with increased anterior lean s/t dizziness and difficulty maintaining upright trunk posture Postural control: Other (comment) (anterior lean) Standing balance support: Bilateral upper extremity supported, During functional activity, Reliant on assistive device for balance Standing balance-Leahy Scale: Poor Standing balance comment: Pt reliant on RW for static standing and tends to lean posteriorly once dizziness/lightheadedness begins.                            Communication Communication Communication: Impaired Factors Affecting Communication: Hearing impaired  Cognition Arousal: Alert Behavior During Therapy: WFL for tasks assessed/performed   PT - Cognitive impairments: No apparent impairments                       PT - Cognition Comments: Decreased insight into deficits/safety. Pt states he has RW but "it made me fall" and eager to DC despite his orthostatic symptoms remaining. RN notified of pt BP readings with abd binder/TED hose donned. Following commands: Intact      Cueing Cueing Techniques: Verbal cues  Exercises      General Comments General comments (skin integrity, edema, etc.): see BP in comments above      Pertinent Vitals/Pain Pain Assessment Pain Assessment: Faces Faces Pain Scale: Hurts a little bit Pain Location: generalized Pain Descriptors / Indicators: Grimacing Pain Intervention(s): Limited activity within patient's tolerance, Monitored during session, Repositioned, Relaxation    Home Living                          Prior Function            PT Goals (current goals can now be found in the care plan section) Acute Rehab PT Goals Patient Stated Goal: to improve PT Goal Formulation: With patient/family Time For Goal Achievement: 04/01/23 Progress towards PT goals: Progressing toward goals    Frequency    Min  1X/week      PT Plan      Co-evaluation              AM-PAC PT "6 Clicks" Mobility   Outcome Measure  Help needed turning from your back to your side while in a flat bed without using bedrails?: None Help needed moving from lying on your back to sitting on the side of a flat bed without using bedrails?: None Help needed moving to and from a bed to a chair (including a wheelchair)?: A Little Help needed standing up from a chair using your arms (e.g., wheelchair or bedside chair)?: A Little Help needed to walk in hospital room?: A Lot Help needed climbing 3-5 steps with a railing? : Total 6 Click Score: 17    End of Session Equipment Utilized During Treatment: Gait belt;Oxygen Activity Tolerance: Other (comment);Patient tolerated treatment well;Treatment limited secondary to medical complications (Comment) (Pt still limited by dizziness/lightheadedness and BP initially drops when standing but improves with compression socks donned after some ambulation) Patient left: in chair;with call bell/phone within reach;with chair alarm set Nurse Communication: Mobility status;Other (comment) (pt needs wheelchair if going home today due  to decreased activity tolerance and pt reports he does not agree to using the RW he has.) PT Visit Diagnosis: Unsteadiness on feet (R26.81);Other abnormalities of gait and mobility (R26.89);Muscle weakness (generalized) (M62.81);Difficulty in walking, not elsewhere classified (R26.2);Dizziness and giddiness (R42)     Time: 2952-8413 PT Time Calculation (min) (ACUTE ONLY): 9 min  Charges:    $Gait Training: 8-22 mins PT General Charges $$ ACUTE PT VISIT: 1 Visit                     Quinn Bartling P., PTA Acute Rehabilitation Services Secure Chat Preferred 9a-5:30pm Office: [phone]    Dorathy Kinsman [facility] 03/23/2023, 4:52 PM

## 2023-03-23 NOTE — Progress Notes (Addendum)
 SATURATION QUALIFICATIONS: (This note is used to comply with regulatory documentation for home oxygen)  Patient Saturations on Room Air at Rest = 93%  Patient Saturations on Room Air while Ambulating = 88%  Patient Saturations on 2 Liters of Oxygen while Ambulating = 90%  Please briefly explain why patient needs home oxygen: Depending on O2 goal, pt may need O2 for exertional tasks to maintain SpO2 WFL. Per chart orders, SpO2 goal is 92% or greater, may need to confirm with attending MD if pt has lower SpO2 goal.

## 2023-03-23 NOTE — Assessment & Plan Note (Deleted)
 Pt was noted to be pre-syncopal appearing while working with OT throughout admission.  Repeat Orthostatics obtained bedside, which demonstrated drop in BP from 141/58->125/53 with lying to sitting. Sitting to standing drop to 108/50. Pulse steady 69-70. Could be multifactorial given reported poor PO with liquid diet, overall illness presentation, and medication-related given his current hypertensive and heart failure regimens. Reduced metoprolol on 2/26.  -Will reduce doxazosin 4 mg currently - Encourage behavioral modification to limit exacerbation of symptoms

## 2023-03-23 NOTE — Discharge Summary (Signed)
 Family Medicine Teaching [facility] Discharge Summary  Patient name: Kirk [name] Medical record number: [id number] Date of birth: August 26, 1960 Age: 63 y.o. Gender: male Date of Admission: 03/16/2023  Date of Discharge: 03/23/2023 Admitting Physician: Leighton Roach McDiarmid, MD  Primary Care Provider: Caesar Bookman, NP Consultants: Nephrology, Heme/Onc  Indication for Hospitalization: Acute hypoxic respiratory failure  Discharge Diagnoses/Problem List:  Principal Problem for Admission: Influenza A Other Problems addressed during stay:  Principal Problem:   Influenza A with pneumonia Active Problems:   Acute hypoxic respiratory failure (HCC)   ESRD (end stage renal disease) on dialysis (HCC)   Pancytopenia (HCC)   Anemia of chronic disease   ESRD (end stage renal disease) (HCC)   Heart failure with mildly reduced ejection fraction (HFmrEF, 41-49%) (HCC)   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 male with a pertinent PMH of ESRD on HD (MWF) as well as Alport syndrome s/p kidney transplant, metastatic breast cancer ([place]), chronic pancytopenia, OA on CPAP, CAD, HFrEF, HTN, and T2DM who presented with dyspnea and was admitted to the [facility] Medicine Teaching Service for AHRF in setting of influenza A infection.  AHRF  Flu A+  Patient presenting for worsening shortness of breath over 1 week, febrile on admission to 101F, with hypoxia to the 90s requiring nasal cannula.  Respiratory panel positive for influenza A.  CBC showed pancytopenia as below.  He was placed on nasal cannula as well as CPAP when sleeping.  He received short course of cefepime, vancomycin, azithromycin but was  discontinued due to his improvement.  He was started on Tamiflu 2/25 with HD and completed 2 doses prior to discontinuing 2/28.  He completed 5 day steroid course on 2/27 and maintenance inhalers with Breo Ellipta and Incruse Ellipta were started.  By discharge, he was weaned to room air and breathing comfortably.  When ambulating with RN and PT, patient's O2 saturation ranged between 88 and 91% on room air, but with minimal dyspnea.  Patient declined evaluation for CIR and declined HHPT, preferring to go directly home without PT follow up.  Wheelchair DME ordered at discharge.  ESRD on HD MWF  Alport Syndrome s/p failed renal transplant  Only tolerated 1hr of HD at session prior to admission.  Patient received hemodialysis over admission with large volume fluid removal multiple times, net negative ~10.7L at time of discharge.  Fluid status improved and achieved 83.8 kg dry weight.  Instructed to resume MWF schedule at time of discharge.  HFmrEF, PAH  Orthostatic hypotension BNP elevated to 1600 on admission.  Patient with large volume removed in HD over admission.  Repeat echo demonstrated decreased EF from prior at 45 to 50% with G2DD.  Pulmonary artery systolic pressure also moderately elevated.  Imdur and metoprolol were continued on admission; however, due to positive orthostatic BP, his metoprolol was reduced to 50 mg BID.  Home hydralazine was likewise decreased to 50 mg BID.  Volume status remained stable with continued HD.  Pancytopenia  Metastatic breast cancer Chronic, sees hematology oncology outpatient.  Last Neulasta injection was mid-January.  Became neutropenic briefly but recovered well on serial lab studies.  He was evaluated by his  primary heme/onc, Dr. Pamelia Hoit, and followed throughout admission.  Did report some darkened stools prior to admission and FOBT was attempted, but unable to be obtained prior to dischage.  No interventions were necessary and he remained above  platelet/hemoglobin thresholds without need for transfusion.  Dysphagia  Reported choking sensation and globus sensation that are both aggravated and precipitated with swallowing.  SLP evaluated and MBS were obtained, which were generally unremarkable except possible short-segment narrowing or distal esophageal ring that which caused stoppage of a 13 mm barium tablet.  Unclear etiology.  He was started on 20 mg Protonix daily and ultimately increased to 40 mg given persistent symptoms.  Sent recommendation to PCP for GI referral on outpatient basis and [facility] phone number as well as ambulatory referral at time of discharge provided.  Other chronic conditions were medically managed with home medications and formulary alternatives as necessary (hypertension, T2DM, depression, CAD, HLD, OSA)  PCP Follow-up Recommendations: Patient refused inpatient rehab, discharged home directly Follow up respiratory status, started on Breo Ellipta and Incruse Ellipta given underlying asthma Resume MWF HD schedule starting 03/25/23 with new EDW 83.5 kg, discontinued sevelamer per Nephro Decreased metoprolol to 50 mg BID, hydralazine 50 mg TID, and doxazosin 4 mg nightly in setting of orthostatic hypotension; evaluate for dizziness/orthostatic symptoms and falls risk Please place GI referral for dysphagia and dark stool follow up in setting of pancytopenia but stable hemoglobin, started pantoprazole 40 mg daily  Disposition: Home, declined CIR and HHPT, wheelchair DME ordered  Discharge Condition: Stable, saturating well on room air and ambulated without desatting, euvolemic and back on MWF HD  Discharge Exam:  Vitals:   03/23/23 1300 03/23/23 1315  BP: (!) 148/61 (!) 153/84  Pulse: 69 70  Resp: (!) 21 (!) 22  Temp:  97.6 F (36.4 C)  SpO2: 94% 94%   Physical Exam: General: Age-appropriate, resting comfortably in chair, NAD, alert and at baseline. HEENT: MMM. No scleral icterus or  injections. Cardiovascular: No JVD. Regular rate and rhythm. Normal S1/S2. No murmurs, rubs, or gallops appreciated. 2+ radial pulses. Pulmonary: Clear bilaterally to ascultation. No wheezes, crackles, or rhonchi. No increased WOB, no accessory muscle usage on room air. Abdominal: No tenderness to deep or light palpation. No rebound or guarding, nondistended. No HSM. Skin: Warm and dry. Extremities: Overlying skin changes over bilateral LE. No peripheral edema bilaterally. Capillary refill <2 seconds.  Significant Procedures: Barium swallow study 2/28: Possible short-segment narrowing or distal esophageal ring which caused stoppage of a 13 mm barium tablet.  No dysmotility. Swallow function 2/26: DIGEST swallow severity rating 0  Significant Labs and Imaging:  Recent Labs  Lab 03/22/23 0232 03/23/23 0230  WBC 5.3 4.5  HGB 10.0* 10.1*  HCT 30.1* 30.5*  PLT 101* 86*   Recent Labs  Lab 03/22/23 0232 03/23/23 0230  NA 136 134*  K 4.4 4.1  CL 95* 95*  CO2 27 24  GLUCOSE 103* 105*  BUN 37* 50*  CREATININE 8.28* 10.34*  CALCIUM 9.7 9.3  MG 2.3 2.2  PHOS 4.2 4.9*  ALBUMIN 3.2* 3.2*   - Troponin: 69>72>76>72>18 - RPP negative - FOBT not collected  Results/Tests Pending at Time of Discharge: None  Discharge Medications:  Allergies as of 03/23/2023       Reactions   Codeine Nausea And Vomiting   Hydrocodone-acetaminophen Nausea And Vomiting        Medication List     STOP taking these medications    diphenhydrAMINE 25 mg capsule  Commonly known as: BENADRYL   guaiFENesin-dextromethorphan 100-10 MG/5ML syrup Commonly known as: ROBITUSSIN DM   ROBITUSSIN 12 HOUR COUGH PO   sevelamer carbonate 800 MG tablet Commonly known as: Renvela   traMADol 50 MG tablet Commonly known as: ULTRAM       TAKE these medications    acetaminophen 500 MG tablet Commonly known as: TYLENOL Take 1 tablet (500 mg total) by mouth every 6 (six) hours as needed for moderate pain.    albuterol 108 (90 Base) MCG/ACT inhaler Commonly known as: VENTOLIN HFA Inhale 2 puffs into the lungs every 6 (six) hours as needed for wheezing or shortness of breath.   albuterol (2.5 MG/3ML) 0.083% nebulizer solution Commonly known as: PROVENTIL Take 3 mLs (2.5 mg total) by nebulization every 6 (six) hours as needed for wheezing or shortness of breath.   amLODipine 10 MG tablet Commonly known as: NORVASC TAKE 1 TABLET BY MOUTH EVERY DAY   aspirin 81 MG chewable tablet Chew 81 mg by mouth daily.   doxazosin 4 MG tablet Commonly known as: CARDURA Take 1 tablet (4 mg total) by mouth at bedtime. What changed:  medication strength how much to take when to take this   DULoxetine 60 MG capsule Commonly known as: CYMBALTA Take 60 mg by mouth daily.   fluticasone furoate-vilanterol 200-25 MCG/ACT Aepb Commonly known as: Breo Ellipta Inhale 1 puff into the lungs in the morning and at bedtime.   guaiFENesin 600 MG 12 hr tablet Commonly known as: MUCINEX Take 1,200 mg by mouth 2 (two) times daily as needed for cough or to loosen phlegm.   hydrALAZINE 50 MG tablet Commonly known as: APRESOLINE Take 1 tablet (50 mg total) by mouth 3 (three) times daily. What changed:  medication strength how much to take   Incruse Ellipta 62.5 MCG/ACT Aepb Generic drug: umeclidinium bromide Inhale 1 puff into the lungs daily. Start taking on: March 24, 2023   isosorbide mononitrate 60 MG 24 hr tablet Commonly known as: IMDUR Take 60 mg by mouth daily.   metoprolol tartrate 50 MG tablet Commonly known as: LOPRESSOR Take 1 tablet (50 mg total) by mouth 2 (two) times daily. What changed:  medication strength how much to take   multivitamin Tabs tablet Take 1 tablet by mouth daily.   ondansetron 4 MG disintegrating tablet Commonly known as: ZOFRAN-ODT TAKE 1 TABLET BY MOUTH EVERY 8 HOURS AS NEEDED FOR NAUSEA AND VOMITING   pantoprazole 40 MG tablet Commonly known as: PROTONIX Take  1 tablet (40 mg total) by mouth daily.   pravastatin 40 MG tablet Commonly known as: PRAVACHOL TAKE 1 TABLET BY MOUTH EVERY DAY IN THE EVENING   traZODone 50 MG tablet Commonly known as: DESYREL Take 0.5-1 tablets (25-50 mg total) by mouth at bedtime as needed. for sleep What changed:  how much to take reasons to take this   Velphoro 500 MG chewable tablet Generic drug: sucroferric oxyhydroxide Chew 1,000 mg by mouth 3 (three) times daily.   Vitamin D3 25 MCG (1000 UT) Caps Take 4,000 Units by mouth daily. Take at home on Tues, Thurs, Sat, and Sun               Durable Medical Equipment  (From admission, onward)           Start     Ordered   03/23/23 1619  For home use only DME standard manual wheelchair with seat cushion  Once       Comments: Patient  suffers from Congestive Heart Failure and COPD which impairs their ability to perform daily activities like bathing, dressing, feeding, grooming, and toileting in the home.  A walker will not resolve issue with performing activities of daily living. A wheelchair will allow patient to safely perform daily activities. Patient can safely propel the wheelchair in the home or has a caregiver who can provide assistance. Length of need Lifetime. Accessories: elevating leg rests (ELRs), wheel locks, extensions and anti-tippers.   03/23/23 1619           Discharge Instructions: Please refer to Patient Instructions section of EMR for full details.  Patient was counseled important signs and symptoms that should prompt return to medical care, changes in medications, dietary instructions, activity restrictions, and follow up appointments.  Follow-Up Appointments: Future Appointments  Date Time Provider Department Center  04/02/2023 10:40 AM Ngetich, Donalee Citrin, NP P[facility] None  04/21/2023  8:00 PM Waymon Budge, MD [facility] MSD  06/25/2023  9:15 AM Alver Sorrow, NP [facility]  07/31/2023  2:00 PM [facility] LAB [facility]  None  07/31/2023  2:30 PM Serena Croissant, MD [facility] None  09/26/2023 11:00 AM Ngetich, Donalee Citrin, NP PSC[facility] None   Sharion Dove, Sandro Burgo, MD 03/23/2023, 6:07 PM PGY-1, [facility]

## 2023-03-23 NOTE — Progress Notes (Signed)
 Dr. Claudean Severance and Dr. Sharion Dove notified that patient refuses to give stool specimen for ordered occult test, stating that his doctor told him that this could be done as outpatient and he just had a BM. MDs state that patient may be discharged without specimen being collected.

## 2023-03-23 NOTE — Progress Notes (Signed)
 [facility] Progress Note  Subjective: Note that he was screened as a potential candidate for CIR.  Seen and examined on dialysis.  Procedure supervised.  Left AVF in use.  Blood pressure 174/63 and HR 62.  Tolerating goal.  He is on CPAP while sleeping.   Review of systems:      He feels like his shortness of breath is better today Denies n/v   Vitals:   03/23/23 0841 03/23/23 0845 03/23/23 0854 03/23/23 0900  BP:  (!) 153/69 (!) 163/56 (!) 164/68  Pulse:  64 (!) 59 71  Resp:  (!) 22 20 19   Temp:  98.1 F (36.7 C)    TempSrc:  Oral    SpO2:  95% 94% 97%  Weight: 86.2 kg     Height:        Physical Exam:      General adult male in bed in no acute distress HEENT normocephalic atraumatic extraocular movements intact sclera anicteric Neck supple trachea midline Lungs crackles bilaterally normal work of breathing at rest on CPAP Heart S1S2 no rub Abdomen soft nontender nondistended Extremities no lower extremity edema  Psych normal mood and affect Left AVF in use       Renal-related home meds: - norvasc 10 - cardura 8 mg hs - hydralazine 100 tid - metoprolol 100 bid - renavite - renvela 1600 ac tid - velphoro 1 gm ac tid      OP HD:  MWF  [name] EDW 88.6 kg  4h   B450  ml/min DF 700 ml/min  2K/2 Ca bath L AVF  Meds: mircera 150 mcg every 2 weeks (last given 2/19), venofer 50 mg weekly, calcitriol 1.75 mcg three times a week  CXR 2/22 - vascular congestion bilat  CTA chest     Assessment/ Plan: Acute hypoxic resp failure - CXR w/ congestion, poss early edema.  Secondary to a combination of influenza and fluid.  He has improved somewhat with UF but it is not solely due to fluid.   Supportive care for flu A per primary team  Optimize volume status with HD Abx per primary team discretion.  Appreciate other interventions such as swallowing eval  Acute flu A infection - s/p solumedrol. S/p tamiflu.  nebs per primary team   ESRD - on HD  MWF usually.  He is off schedule this week  HD TTS this week while inpatient.  Then back to MWF schedule next week (week of 03/25/23)  Will need to ensure that we update his EDW to his lowest post weight here as he appears to have lost weight.  Try 83.5 kg for EDW HTN - optimize fluid removal with HD.  Reduced hydralazine to 50 mg TID to allow for UF.  Anemia of esrd - last outpatient ESA on 2/19. Hb improving. PRBC's per primary team   Secondary hyperparathyroidism - hyperphos improved on velphoro  Alport's syndrome - noted     Disposition - per primary team.  Per charting considering CIR     Recent Labs  Lab 03/22/23 0232 03/23/23 0230  HGB 10.0* 10.1*  ALBUMIN 3.2* 3.2*  CALCIUM 9.7 9.3  PHOS 4.2 4.9*  CREATININE 8.28* 10.34*  K 4.4 4.1   No results for input(s): "IRON", "TIBC", "FERRITIN" in the last 168 hours. Inpatient medications:  amLODipine  10 mg Oral Daily   aspirin  81 mg Oral Daily   Chlorhexidine Gluconate Cloth  6 each Topical Q0600   Chlorhexidine Gluconate Cloth  6 each Topical Q0600   doxazosin  4 mg Oral QHS   DULoxetine  60 mg Oral Daily   guaiFENesin  600 mg Oral BID   heparin  5,000 Units Subcutaneous Q8H   hydrALAZINE  50 mg Oral TID   ipratropium-albuterol  3 mL Nebulization Q6H   isosorbide mononitrate  60 mg Oral Daily   metoprolol tartrate  50 mg Oral BID   mometasone-formoterol  2 puff Inhalation BID   multivitamin  1 tablet Oral Daily   pantoprazole  40 mg Oral Daily   pravastatin  40 mg Oral Daily   sucroferric oxyhydroxide  1,000 mg Oral TID WC   umeclidinium bromide  1 puff Inhalation Daily    anticoagulant sodium citrate       acetaminophen, albuterol, anticoagulant sodium citrate, feeding supplement (NEPRO CARB STEADY), guaiFENesin, heparin, lidocaine (PF), lidocaine-prilocaine, ondansetron, pentafluoroprop-tetrafluoroeth, traMADol, traZODone     Estanislado Emms, MD 9:46 AM 03/23/2023

## 2023-03-23 NOTE — Assessment & Plan Note (Deleted)
 Repeat echo obtained 02/24 with EF worsened to 45-50% and evidence of G2DD. Additionally, aortic valve with moderate stenosis and moderately elevated PA pressure.  -Volume management as above, stable overall. -Continue home Imdur; reduced metop dosing 50 mg BID   -Will need cards follow up outpatient for optimization of medical therapy

## 2023-03-23 NOTE — Progress Notes (Signed)
 Received patient in bed to unit.  Alert and oriented.  Informed consent signed and in chart.   TX duration: 3 hours and 4 minutes.  Patient terminated session early due to cramping. AMA paperwork signed.  Patient tolerated well.  Transported back to the room  Alert, without acute distress.  Hand-off given to patient's nurse.   Access used: Left AV fistula Access issues: none  Total UF removed: Medication(s) given: 200cc bolus   03/23/23 1255  Vitals  Temp 98.6 F (37 C)  Temp Source Oral  BP (!) 146/56  Pulse Rate 67  ECG Heart Rate 70  Resp (!) 23  Oxygen Therapy  SpO2 97 %  O2 Device Room Air  During Treatment Monitoring  Duration of HD Treatment -hour(s) 3.07 hour(s)  HD Safety Checks Performed Yes  Intra-Hemodialysis Comments See progress note (PAtient terminated dialysis session early due to cramping, AMA paperwork signed.)  Dialysis Fluid Bolus Normal Saline  Bolus Amount (mL) 300 mL  Post Treatment  Dialyzer Clearance Clear  Liters Processed 73.6  Fluid Removed (mL) 2400 mL  Tolerated HD Treatment Yes  AVG/AVF Arterial Site Held (minutes) 5 minutes  AVG/AVF Venous Site Held (minutes) 5 minutes  Fistula / Graft Left Forearm Arteriovenous fistula  No placement date or time found.   Placed prior to admission: Yes  Orientation: Left  Access Location: Forearm  Access Type: Arteriovenous fistula  Status Deaccessed     Stacie Glaze LPN Kidney Dialysis Unit

## 2023-03-23 NOTE — Discharge Instructions (Addendum)
 Dear Kirk [name],   Thank you so much for allowing [place] to be part of your care!  You were admitted to [facility] for respiratory failure with influenza A and fluid overload after some missed dialysis sessions.   DISCHARGE MEDICATION CHANGES Please resume your MWF dialysis schedule on Monday 3/3. We have started two daily maintenance inhalers.  Please use them twice daily EVERY day, regardless of how your breathing feels. Please do not use your dextromethorphan cough treatments, we do not recommend using this long term. We have decreased your doxazosin, hydralazine, and metoprolol doses like it says in this medication packet because your blood pressure was dropping when you stand.  Please be careful of dizziness when standing and always stand up slowly and carefully.  POST-HOSPITAL & CARE INSTRUCTIONS We have placed a referral to our GI doctors, but the referral from the hospital may not get followed up.  Please see your PCP to discuss this referral with them.  Here is the [facility] phone number to call if needed, but recommend discussing with your PCP first: [phone] Please let PCP/Specialists know of any changes that were made.  Please see medications section of this packet for any medication changes.   DOCTOR'S APPOINTMENT & FOLLOW UP CARE INSTRUCTIONS  Future Appointments  Date Time Provider Department Center  04/02/2023 10:40 AM Ngetich, Donalee Citrin, NP [facility] None  04/21/2023  8:00 PM Waymon Budge, MD [facility] MSD  06/25/2023  9:15 AM Alver Sorrow, NP [facility]CVD DWB  07/31/2023  2:00 PM [facility] LAB [facility] None  07/31/2023  2:30 PM Serena Croissant, MD [facility] None  09/26/2023 11:00 AM Ngetich, Dinah C, NP [facility] None     RETURN PRECAUTIONS: If you miss dialysis and notice your weight going up, you will need to present to the hospital for emergency dialysis If you have trouble breathing, are feeling increased shortness of breath with walking, or cannot catch  your breath, please use your inhaler and come back for evaluation If you have fevers >100.4 please return as well  Take care and be well!  Family Medicine Teaching Service Inpatient Team [facility]  [address] [place], [place] [address] [phone]

## 2023-03-23 NOTE — Progress Notes (Signed)
 Physical Therapy Treatment Patient Details Name: Kirk [name] MRN: [id number] DOB: Nov 14, 1960 Today's Date: 03/23/2023     History of Present Illness Pt is 63 year old presented to [facility] on  03/16/23 for SOB and feeling of being filled with fluid even after HD.  Pt with PNA , flu A, and hypoxic respiratory failure. PMH - ESRD on HD, failed kidney transplant, htn, anxiety, OSA, SDH, GI bleed, metastatic breast cancer, pancytopenia, seizure      PT Comments  Pt received in supine, agreeable to therapy session with encouragement after HD, pt eager for DC home. Pt needing up to CGA for safety but tending to be impulsive and performs transfers and short gait trial in room with Supervision/safety cues prior to needing seated break due to evolving orthostatic symptoms. Pt instructed on reasoning for using abdominal binder and TED hose had not yet arrived to his room, so pt agreeable to waiting for TED hose to be ordered before additional gait trial to assess his ambulatory saturations as pt dypsneic 2/4 with minimal exertion. Pt SpO2 93-94% on RA with cues for pursed-lip breathing and 89% on RA just after very short gait trial at bedside, thus need for longer distance trial to ensure pt safety. Pt may need DME update as well, supervising PT messaged to discuss and plan to finalize recs in second visit after TED hose arrive. Pt BP 112/59 (77) HR 70 bpm sitting in chair with feet down at end of session. Pt continues to benefit from PT services to progress toward functional mobility goals.    Subjective Data  Subjective Pt reports he is able to go to the bathroom by himself but needs to stop and rest multiple times on the way there  Patient Stated Goal to improve  Precautions  Precautions Fall  Recall of Precautions/Restrictions Intact  Precaution/Restrictions Comments watch BP and SpO2  Restrictions  Weight Bearing Restrictions Per Provider Order No  Pain Assessment  Pain Assessment Faces  Faces Pain  Scale 2  Pain Location generalized  Pain Descriptors / Indicators Grimacing  Pain Intervention(s) Limited activity within patient's tolerance;Monitored during session;Repositioned  Cognition  Arousal Alert  Behavior During Therapy WFL for tasks assessed/performed  PT - Cognitive impairments No apparent impairments  PT - Cognition Comments Decreased insight into deficits/safety. Pt states he has RW but "it made me fall" and eager to DC despite his orthostatic symptoms remaining. RN notified of pt BP readings with abd binder/TED hose donned.  Following Commands  Following commands Intact  Cueing  Cueing Techniques Verbal cues  Communication  Communication Impaired  Factors Affecting Communication Hearing impaired  Bed Mobility  Overal bed mobility Modified Independent  General bed mobility comments supine to EOB  Transfers  Overall transfer level Needs assistance  Equipment used Rolling walker (2 wheels)  Transfers Sit to/from Stand  Sit to Stand Supervision  General transfer comment CGA for safety for stand>sit due to orthostatic symptoms.  Pt stands with increased fwd trunk posture and requires additional time. Pt requires cueing for hand placement to recliner arm rest upon sitting. Supervision for sit>stand with safety cues to RW.  Ambulation/Gait  Ambulation/Gait assistance Supervision  Gait Distance (Feet) 10 Feet  Assistive device  (furniture walking for support)  Gait Pattern/deviations Decreased stride length;Trunk flexed;WFL(Within Functional Limits) (downward gaze)  General Gait Details Limited distance around bed to chair to check BP/SpO2, pt with orthostatic symptoms so abdominal binder donned once pt sitting up in chair.  Balance  Overall balance assessment  Needs assistance  Sitting-balance support No upper extremity supported;Feet supported  Sitting balance-Leahy Scale Good  Sitting balance - Comments tending to use UE support at EOB due to c/o fatigue  Postural  control  (anterior lean)  Standing balance support Bilateral upper extremity supported;During functional activity;No upper extremity supported  Standing balance-Leahy Scale Poor  Standing balance comment furniture vs RW for support  PT - End of Session  Equipment Utilized During Treatment Gait belt;Oxygen  Activity Tolerance Other (comment);Patient tolerated treatment well;Treatment limited secondary to medical complications (Comment) (Pt still limited by dizziness/lightheadedness and BP initially drops when standing, unit secretary notified to order compression socks for him)  Patient left in chair;with call bell/phone within reach;with chair alarm set  Nurse Communication Mobility status;Other (comment) (pt needs wheelchair if going home today due to decreased activity tolerance and pt reports he does not agree to using the RW he has.)   PT - Assessment/Plan  PT Visit Diagnosis Unsteadiness on feet (R26.81);Other abnormalities of gait and mobility (R26.89);Muscle weakness (generalized) (M62.81);Difficulty in walking, not elsewhere classified (R26.2);Dizziness and giddiness (R42)  PT Frequency (ACUTE ONLY) Min 1X/week  Follow Up Recommendations Acute inpatient rehab (3hours/day) (pt plans to refuse so will need max HH services)  Patient can return home with the following A lot of help with walking and/or transfers;A lot of help with bathing/dressing/bathroom;Assistance with cooking/housework;Assist for transportation;Help with stairs or ramp for entrance  PT equipment Wheelchair (measurements PT);Wheelchair cushion (measurements PT);Rollator (4 wheels) (wc if rollator unavailable)  AM-PAC PT "6 Clicks" Mobility Outcome Measure (Version 2)  Help needed turning from your back to your side while in a flat bed without using bedrails? 4  Help needed moving from lying on your back to sitting on the side of a flat bed without using bedrails? 4  Help needed moving to and from a bed to a chair  (including a wheelchair)? 3  Help needed standing up from a chair using your arms (e.g., wheelchair or bedside chair)? 3  Help needed to walk in hospital room? 2  Help needed climbing 3-5 steps with a railing?  1  6 Click Score 17  Consider Recommendation of Discharge To: [facility] with [facility]  Progressive Mobility  What is the highest level of mobility based on the progressive mobility assessment? Level 4 (Walks with assist in room) - Balance while marching in place and cannot step forward and back - Complete  Activity Ambulated with assistance in room;Transferred from bed to chair  PT Goal Progression  Progress towards PT goals Progressing toward goals  Acute Rehab PT Goals  PT Goal Formulation With patient/family  Time For Goal Achievement 04/01/23  PT Time Calculation  PT Start Time (ACUTE ONLY) 1550  PT Stop Time (ACUTE ONLY) 1612  PT Time Calculation (min) (ACUTE ONLY) 22 min  PT General Charges  $$ ACUTE PT VISIT 1 Visit  PT Treatments  $Therapeutic Activity 8-22 mins   Noreen Mackintosh P., PTA Acute Rehabilitation Services Secure Chat Preferred 9a-5:30pm Office: [phone]

## 2023-03-23 NOTE — Progress Notes (Signed)
 Received referral for a wheelchair. Contacted [facility] for DME referral. He is asking that they deliver the [facility] at home. Ada with [facility] made aware that pt doesn't want to wait for the [facility] and he is requesting that they deliver the [facility] at home.

## 2023-03-23 NOTE — Progress Notes (Signed)
 PT Cancellation Note  Patient Details Name: Kirk [name] MRN: [id number] DOB: [birth year]   Cancelled Treatment:    Reason Eval/Treat Not Completed: (P) Patient at procedure or test/unavailable (at HD dept.) Per chart review pt has been experiencing pre-syncopal symptoms with transfers/pre-gait attempts, MD/RN notified he may benefit from abdominal binder and TED hose ordered to try to improve hemodynamic stability with postural changes prior to next PT session. Will continue efforts per PT plan of care as schedule permits.   Annaleigh Steinmeyer M Karletta Millay 03/23/2023, 11:09 AM

## 2023-03-23 NOTE — Progress Notes (Signed)
 Discharge instructions provided. Patient verbalizes understanding of all instructions and follow-up care. Patient discharged to home with all belongings at 1650 on 03/23/23 via wheelchair by NT.

## 2023-03-23 NOTE — Progress Notes (Signed)
 PT is recommending a rollator. Met with pt. Pt doesn't have a preference for a DME agency. Will contact [facility] for DME referral and he agreed. Contacted Ada with [facility]. She reports they provided a RW in 2023 and they won't be able to provide a rollator. Pt, MD and nurse made aware of above.

## 2023-03-23 NOTE — Assessment & Plan Note (Deleted)
 Chronic, likely in the setting of multiple comorbidities per Heme/Onc. Platelets low but stable on repeat labs. Last Neulasta injection 01/16. Does report some darkening of his stool recently as well but no overt blood.   -Trend CBC w/ diff -Monitor hgb, transfusion threshold >8   -Monitor plts, transfusion threshold >10 -Heme/onc following, plan to monitor as his labs have improved overall.  - Collect FOBT, if negative will consider GI consult or referral

## 2023-03-23 NOTE — Plan of Care (Signed)
  Problem: Education: Goal: Knowledge of General Education information will improve Description: Including pain rating scale, medication(s)/side effects and non-pharmacologic comfort measures 03/23/2023 1702 by Reynold Bowen, RN Outcome: Adequate for Discharge 03/23/2023 1702 by Reynold Bowen, RN Outcome: Adequate for Discharge   Problem: Health Behavior/Discharge Planning: Goal: Ability to manage health-related needs will improve 03/23/2023 1702 by Reynold Bowen, RN Outcome: Adequate for Discharge 03/23/2023 1702 by Reynold Bowen, RN Outcome: Adequate for Discharge   Problem: Clinical Measurements: Goal: Ability to maintain clinical measurements within normal limits will improve 03/23/2023 1702 by Reynold Bowen, RN Outcome: Adequate for Discharge 03/23/2023 1702 by Reynold Bowen, RN Outcome: Adequate for Discharge Goal: Will remain free from infection 03/23/2023 1702 by Reynold Bowen, RN Outcome: Adequate for Discharge 03/23/2023 1702 by Reynold Bowen, RN Outcome: Adequate for Discharge Goal: Diagnostic test results will improve 03/23/2023 1702 by Reynold Bowen, RN Outcome: Adequate for Discharge 03/23/2023 1702 by Reynold Bowen, RN Outcome: Adequate for Discharge Goal: Respiratory complications will improve 03/23/2023 1702 by Reynold Bowen, RN Outcome: Adequate for Discharge 03/23/2023 1702 by Reynold Bowen, RN Outcome: Adequate for Discharge Goal: Cardiovascular complication will be avoided 03/23/2023 1702 by Reynold Bowen, RN Outcome: Adequate for Discharge 03/23/2023 1702 by Reynold Bowen, RN Outcome: Adequate for Discharge   Problem: Activity: Goal: Risk for activity intolerance will decrease 03/23/2023 1702 by Reynold Bowen, RN Outcome: Adequate for Discharge 03/23/2023 1702 by Reynold Bowen, RN Outcome: Adequate for Discharge   Problem:  Nutrition: Goal: Adequate nutrition will be maintained 03/23/2023 1702 by Reynold Bowen, RN Outcome: Adequate for Discharge 03/23/2023 1702 by Reynold Bowen, RN Outcome: Adequate for Discharge   Problem: Coping: Goal: Level of anxiety will decrease 03/23/2023 1702 by Reynold Bowen, RN Outcome: Adequate for Discharge 03/23/2023 1702 by Reynold Bowen, RN Outcome: Adequate for Discharge   Problem: Elimination: Goal: Will not experience complications related to bowel motility 03/23/2023 1702 by Reynold Bowen, RN Outcome: Adequate for Discharge 03/23/2023 1702 by Reynold Bowen, RN Outcome: Adequate for Discharge Goal: Will not experience complications related to urinary retention 03/23/2023 1702 by Reynold Bowen, RN Outcome: Adequate for Discharge 03/23/2023 1702 by Reynold Bowen, RN Outcome: Adequate for Discharge   Problem: Pain Managment: Goal: General experience of comfort will improve and/or be controlled 03/23/2023 1702 by Reynold Bowen, RN Outcome: Adequate for Discharge 03/23/2023 1702 by Reynold Bowen, RN Outcome: Adequate for Discharge   Problem: Safety: Goal: Ability to remain free from injury will improve 03/23/2023 1702 by Reynold Bowen, RN Outcome: Adequate for Discharge 03/23/2023 1702 by Reynold Bowen, RN Outcome: Adequate for Discharge   Problem: Skin Integrity: Goal: Risk for impaired skin integrity will decrease 03/23/2023 1702 by Reynold Bowen, RN Outcome: Adequate for Discharge 03/23/2023 1702 by Reynold Bowen, RN Outcome: Adequate for Discharge

## 2023-03-23 NOTE — Assessment & Plan Note (Deleted)
 Patient gradually weaning down overnight and was on room air during her discussion. Continues to report some sob with movement and intermittently during conversation.  Was compliant with CPAP overnight and was satting on 3 L of nasal cannula in the low 90s.  -Continue Tamiflu (discussed below) -Completed steroid burst on 2/27 -Start Incruse Ellipta -continue DuoNebs q4h  -Supportive care: Tylenol, Mucinex -Continue pulm toilet, spirometry, flutter valve, inhalers  -On 3L [place]; wean O2 as tolerated. Intermittent CPAP as needed and with sleeping

## 2023-03-23 NOTE — Progress Notes (Addendum)
 Patient transported to HD via bed by transporter at 0834 on 03/23/23.Kirk [name]

## 2023-03-23 NOTE — Assessment & Plan Note (Deleted)
 Reports sensation of choking and globus sensation in chest which is aggravated with swallowing. Has been a long-standing issue for him; heme/onc discussed g-tube as potential option to supplement nutrition. Mild wt loss since August 2024 (previously 88kg and now 84kg). MBS with SLP was negative to several trials of swallowing. Unclear underlying etiology but suspect he may have some element of baseline dysphagia as well as pulmonary clearing as his infection resolves.  -SLP evaluated continue a regular diet with thin liquids, signed off, appreciate Rex - Increase Protonix 40 mg daily - Follow-up with GI outpatient

## 2023-03-23 NOTE — Progress Notes (Signed)
 Patient refused ordered blood sugar check despite education on why this is needed. Teaching service notified. Dr. Claudean Severance acknowledged. No further orders received.

## 2023-03-23 NOTE — Progress Notes (Signed)
 Inpatient Rehab Admissions Coordinator:    I met with Pt. To discuss potential [facility] admit. He states that he does not want to do rehab anywhere, particularly [facility], as he has been there before and had a bad experience. Pt. Prefers to go home. [facility] will sign off. I will notify TOC.  Megan Salon, MS, CCC-SLP Rehab Admissions Coordinator  [phone] (celll) [phone] (office)

## 2023-03-23 NOTE — Care Management (Signed)
    Durable Medical Equipment  (From admission, onward)           Start     Ordered   03/23/23 1619  For home use only DME standard manual wheelchair with seat cushion  Once       Comments: Patient suffers from Congestive Heart Failure and COPD which impairs their ability to perform daily activities like bathing, dressing, feeding, grooming, and toileting in the home.  A walker will not resolve issue with performing activities of daily living. A wheelchair will allow patient to safely perform daily activities. Patient can safely propel the wheelchair in the home or has a caregiver who can provide assistance. Length of need Lifetime. Accessories: elevating leg rests (ELRs), wheel locks, extensions and anti-tippers.   03/23/23 1619

## 2023-03-23 NOTE — Assessment & Plan Note (Deleted)
 MWF outpatient scheduled, but is currently on TTS schedule while inpatient as he refused dialysis on M 02/24.  The patient tolerated hemodialysis without complications yesterday and 3 L were removed. 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 EDW: 88.6, however, he has lost weight recently and this will need to be updated to his lowest weight while he is admitted.  -Nephrology following, appreciate recs -Continue home Phos binders -monitor RFP, Mg -monitor weights  - Fosfomycin x1, patient describes symptoms of urinary infection, however unable to provide adequate urinary sample for UA and only makes drops of urine, will treat symptomatically

## 2023-03-23 NOTE — Progress Notes (Signed)
 Dr. Sharion Dove and Dr. Claudean Severance notified that OT is recommending wheelchair for home use and also results of walking O2 sats. MDs state that home oxygen is not needed. Order was received for wheelchair. Wheelchair to be delivered to patient's home per case manager.

## 2023-03-23 NOTE — Assessment & Plan Note (Deleted)
-  Tamiflu 30 mg on dialysis days (last dose 3/3)

## 2023-03-23 NOTE — Progress Notes (Signed)

## 2023-03-24 ENCOUNTER — Telehealth: Payer: Self-pay | Admitting: Nephrology

## 2023-03-24 NOTE — Telephone Encounter (Signed)
 Transition of Care Contact from Inpatient Facility   Date of Discharge: 03/23/23 Date of Contact: 03/24/23 Method of contact: phone Talked to patient's wife Chyrl Civatte   Patient contacted to discuss transition of care form recent hospitaliztion. Patient was admitted to [facility] from 2/22 to 03/23/23 with the discharge diagnosis of Acute hypoxic Respiratory Failure 2/2 flu + fluid and asthma exacerbation.   Medication changes were reviewed - wife questions why binders changed from Renvela to [employer].  Patient does not have Velphoro at home. No obvious reason noted in chart.  Advised to follow up with RD and MD for clarification and continue Renvela for now.     Patient will follow up with is outpatient dialysis center 03/25/23.  Other follow up needs include none identified.    Virgina Norfolk, PA-C [employer]

## 2023-03-24 NOTE — Discharge Planning (Signed)
 [facility] Patient Discharge Orders- [facility] CLIN[facility]: [name]  Patient's name: Kirk [name] Admit/DC Dates: 03/16/2023 - 03/23/2023  Discharge Diagnoses: Acute hypoxic Respiratory Failure 2/2 flu + fluid Asthma exacerbation  Aranesp: Given: no   Last Hgb: 10.2 PRBC's Given: no  ESA dose for discharge: no change IV Iron dose at discharge: no change  Heparin change: no change  EDW Change: yes - 83.5kg  Bath Change: no   Access intervention/Change: no Details:  Hectorol/Calcitriol change: no  Discharge Labs: Calcium 9.3 Phosphorus 4.9 Albumin 3.2 K+ 4.1  IV Antibiotics: no Details:  On Coumadin?: no    OTHER/APPTS/LAB ORDERS:    D/C Meds to be reconciled by nurse after every discharge.  Completed By: Virgina Norfolk, PA-C   Reviewed by: MD:______ RN_______

## 2023-03-25 ENCOUNTER — Telehealth: Payer: Self-pay

## 2023-03-25 DIAGNOSIS — N186 End stage renal disease: Secondary | ICD-10-CM | POA: Diagnosis not present

## 2023-03-25 DIAGNOSIS — D631 Anemia in chronic kidney disease: Secondary | ICD-10-CM | POA: Diagnosis not present

## 2023-03-25 DIAGNOSIS — D509 Iron deficiency anemia, unspecified: Secondary | ICD-10-CM | POA: Diagnosis not present

## 2023-03-25 DIAGNOSIS — N2581 Secondary hyperparathyroidism of renal origin: Secondary | ICD-10-CM | POA: Diagnosis not present

## 2023-03-25 DIAGNOSIS — Z992 Dependence on renal dialysis: Secondary | ICD-10-CM | POA: Diagnosis not present

## 2023-03-25 NOTE — Progress Notes (Signed)
 Late Note Entry- March 25, 2023  Pt was d/c on Saturday. Contacted [name] this morning to advise staff of pt's d/c date and that pt should resume care today.   Olivia Canter Renal Navigator [phone]

## 2023-03-25 NOTE — Transitions of Care (Post Inpatient/ED Visit) (Signed)
   03/25/2023  Name: MESHILEM MACHUCA MRN: [id number] DOB: November 18, 1960  Today's TOC FU Call Status: Today's TOC FU Call Status:: Unsuccessful Call (1st Attempt) Unsuccessful Call (1st Attempt) Date: 03/25/23  Attempted to reach the patient regarding the most recent Inpatient/ED visit.  Follow Up Plan: Additional outreach attempts will be made to reach the patient to complete the Transitions of Care (Post Inpatient/ED visit) call.   Hilbert Odor RN, CCM [place]  VBCI-Population Health RN Care Manager [phone]

## 2023-03-26 ENCOUNTER — Telehealth: Payer: Self-pay

## 2023-03-26 NOTE — Transitions of Care (Post Inpatient/ED Visit) (Signed)
   03/26/2023  Name: Kirk [name] MRN: [id number] DOB: [birth year]  Today's TOC FU Call Status: Today's TOC FU Call Status:: Unsuccessful Call (2nd Attempt) Unsuccessful Call (2nd Attempt) Date: 03/26/23  Attempted to reach the patient regarding the most recent Inpatient/ED visit.  Follow Up Plan: Additional outreach attempts will be made to reach the patient to complete the Transitions of Care (Post Inpatient/ED visit) call.   Hilbert Odor RN, CCM   VBCI-Population Health RN Care Manager [phone]

## 2023-03-27 ENCOUNTER — Telehealth: Payer: Self-pay | Admitting: Internal Medicine

## 2023-03-27 ENCOUNTER — Telehealth: Payer: Self-pay

## 2023-03-27 DIAGNOSIS — N186 End stage renal disease: Secondary | ICD-10-CM | POA: Diagnosis not present

## 2023-03-27 DIAGNOSIS — Z992 Dependence on renal dialysis: Secondary | ICD-10-CM | POA: Diagnosis not present

## 2023-03-27 DIAGNOSIS — D509 Iron deficiency anemia, unspecified: Secondary | ICD-10-CM | POA: Diagnosis not present

## 2023-03-27 DIAGNOSIS — N2581 Secondary hyperparathyroidism of renal origin: Secondary | ICD-10-CM | POA: Diagnosis not present

## 2023-03-27 DIAGNOSIS — D631 Anemia in chronic kidney disease: Secondary | ICD-10-CM | POA: Diagnosis not present

## 2023-03-27 NOTE — Telephone Encounter (Signed)
 Follow Up:      Patient's wife was returning call to Dr Rennis Golden or Gillian Shields. She did not know who had called.

## 2023-03-27 NOTE — Telephone Encounter (Signed)
 Pt is calling to speak to nurse Eileen Stanford

## 2023-03-27 NOTE — Transitions of Care (Post Inpatient/ED Visit) (Signed)
   03/27/2023  Name: Kirk [name] MRN: [id number] DOB: April 14, 1960  Today's TOC FU Call Status: Today's TOC FU Call Status:: Unsuccessful Call (3rd Attempt) Unsuccessful Call (3rd Attempt) Date: 03/27/23  Attempted to reach the patient regarding the most recent Inpatient/ED visit. TOC RN CM left confidential VM on identified personal mobile phone with a call back number if patient wishes to reach back out after 3rd unsuccessful outreach attempt post discharge from hospital on 03/23/23.   Follow Up Plan: No further outreach attempts will be made at this time. We have been unable to contact the patient.   Gabriel Cirri MSN, RN RN Case Sales executive Health  VBCI-Population Health Office Hours Wed/Thur  8:00 am-6:00 pm Direct Dial: [phone] Main Phone [phone]  Fax: [phone] [email or web address]

## 2023-03-27 NOTE — Telephone Encounter (Signed)
 Called patient back , spoke with patient's wife. Wife verified name and DOB. Patient wife stated she was returning [place] phone call. I notified her about patient's echo results from 03/05/2023: This is an abnormal cardiac PET -suggestive of possible multivessel ischemia. LVEF is lower at 35-37% which may explain his symptoms - he will need a definitive cardiac cath to evaluate.   Patient's wife notified our office he has an AV Fistulagram procedure on 03/28/2023.  Patient prefers a Tuesday or Thursday for the procedure since he has dialysis on Monday, Wednesday, and Friday.  Notified patient we would call back to set up an appt for follow up or cath appt. Patient verbalized understanding.  Josie LPN

## 2023-03-28 ENCOUNTER — Ambulatory Visit (HOSPITAL_COMMUNITY)
Admission: RE | Admit: 2023-03-28 | Discharge: 2023-03-28 | Disposition: A | Discharge status: Home / Self Care | Attending: Nephrology | Admitting: Nephrology

## 2023-03-28 ENCOUNTER — Other Ambulatory Visit: Payer: Self-pay

## 2023-03-28 ENCOUNTER — Encounter (HOSPITAL_COMMUNITY)
Admission: RE | Disposition: A | Discharge status: Home / Self Care | Payer: Self-pay | Source: Home / Self Care | Attending: Nephrology

## 2023-03-28 ENCOUNTER — Encounter (HOSPITAL_COMMUNITY): Payer: Self-pay | Admitting: Nephrology

## 2023-03-28 DIAGNOSIS — I120 Hypertensive chronic kidney disease with stage 5 chronic kidney disease or end stage renal disease: Secondary | ICD-10-CM | POA: Insufficient documentation

## 2023-03-28 DIAGNOSIS — T82858A Stenosis of vascular prosthetic devices, implants and grafts, initial encounter: Secondary | ICD-10-CM | POA: Diagnosis not present

## 2023-03-28 DIAGNOSIS — D631 Anemia in chronic kidney disease: Secondary | ICD-10-CM | POA: Diagnosis not present

## 2023-03-28 DIAGNOSIS — Y832 Surgical operation with anastomosis, bypass or graft as the cause of abnormal reaction of the patient, or of later complication, without mention of misadventure at the time of the procedure: Secondary | ICD-10-CM | POA: Insufficient documentation

## 2023-03-28 DIAGNOSIS — Z87891 Personal history of nicotine dependence: Secondary | ICD-10-CM | POA: Insufficient documentation

## 2023-03-28 DIAGNOSIS — Z992 Dependence on renal dialysis: Secondary | ICD-10-CM | POA: Diagnosis not present

## 2023-03-28 DIAGNOSIS — I871 Compression of vein: Secondary | ICD-10-CM | POA: Diagnosis not present

## 2023-03-28 DIAGNOSIS — N250 Renal osteodystrophy: Secondary | ICD-10-CM | POA: Diagnosis not present

## 2023-03-28 DIAGNOSIS — N186 End stage renal disease: Secondary | ICD-10-CM | POA: Insufficient documentation

## 2023-03-28 HISTORY — PX: PERIPHERAL VASCULAR BALLOON ANGIOPLASTY: CATH118281

## 2023-03-28 HISTORY — PX: A/V FISTULAGRAM: CATH118298

## 2023-03-28 SURGERY — A/V FISTULAGRAM
Anesthesia: LOCAL

## 2023-03-28 MED ORDER — MIDAZOLAM HCL 2 MG/2ML IJ SOLN
INTRAMUSCULAR | Status: AC
  Filled 2023-03-28: qty 2

## 2023-03-28 MED ORDER — LIDOCAINE HCL (PF) 1 % IJ SOLN
INTRAMUSCULAR | Status: DC | PRN
  Administered 2023-03-28: 2 mL via INTRADERMAL

## 2023-03-28 MED ORDER — HEPARIN (PORCINE) IN NACL 1000-0.9 UT/500ML-% IV SOLN
INTRAVENOUS | Status: DC | PRN
  Administered 2023-03-28: 500 mL

## 2023-03-28 MED ORDER — FENTANYL CITRATE (PF) 100 MCG/2ML IJ SOLN
INTRAMUSCULAR | Status: AC
  Filled 2023-03-28: qty 2

## 2023-03-28 MED ORDER — FENTANYL CITRATE (PF) 100 MCG/2ML IJ SOLN
INTRAMUSCULAR | Status: DC | PRN
  Administered 2023-03-28 (×2): 50 ug via INTRAVENOUS

## 2023-03-28 MED ORDER — MIDAZOLAM HCL 2 MG/2ML IJ SOLN
INTRAMUSCULAR | Status: DC | PRN
  Administered 2023-03-28 (×2): 1 mg via INTRAVENOUS

## 2023-03-28 MED ORDER — LIDOCAINE HCL (PF) 1 % IJ SOLN
INTRAMUSCULAR | Status: AC
  Filled 2023-03-28: qty 30

## 2023-03-28 MED ORDER — IODIXANOL 320 MG/ML IV SOLN
INTRAVENOUS | Status: DC | PRN
  Administered 2023-03-28: 12 mL via INTRAVENOUS

## 2023-03-28 SURGICAL SUPPLY — 14 items
BAG SNAP BAND KOVER 36X36 (MISCELLANEOUS) ×2 IMPLANT
BALL ATHLETIS 7.0X80X75 (BALLOONS) ×2 IMPLANT
BALLN ATHLETIS 9X40X75 (BALLOONS) ×2 IMPLANT
BALLOON ATHLETIS 7.0X80X75 (BALLOONS) IMPLANT
BALLOON ATHLETIS 9X40X75 (BALLOONS) IMPLANT
CATH ANGIO 5F BER2 65CM (CATHETERS) IMPLANT
COVER DOME SNAP 22 D (MISCELLANEOUS) ×2 IMPLANT
GUIDEWIRE ANGLED .035 180CM (WIRE) IMPLANT
SHEATH PINNACLE R/O II 6F 4CM (SHEATH) IMPLANT
SHEATH PINNACLE R/O II 7F 4CM (SHEATH) IMPLANT
SYR MEDALLION 10ML (SYRINGE) IMPLANT
SYR MEDALLION 3ML (SYRINGE) IMPLANT
TRAY PV CATH (CUSTOM PROCEDURE TRAY) ×2 IMPLANT
WIRE MICRO SET SILHO 5FR 7 (SHEATH) IMPLANT

## 2023-03-28 NOTE — Op Note (Signed)
 Patient presents for concerns of decreasing flows in his left Cimino which was placed likely over 10 years ago in [place] but I cannot find the records.  On the physical exam, the fistula is hyperpulsatile at the outflow.  Summary:  1)      The patient had successful angioplasty (7x4 -> 9x4 Athletis FE ~24 atm) of significant 80% stenosis in the outflow cephalic vein site; unable to  visualize inflow because of numerous small collaterals. 2)      2 upper arm drainage primarily through the medial vein structures; central veins are widely patent. 3)      This left Cimino remains amenable to future percutaneous intervention as long as it remains patent at least 3 months.  Description of procedure: The arm was prepped and draped in the usual sterile fashion. The left forearm Cimino fistula was cannulated (16109) with a 21G puncture needle directed in a retrograde direction in venous limb of the fistula. A guidewire was inserted and exchanged for a 5 [place] stylette. Contrast [phone] injection via the end of the stylette was performed. The angiogram of the fistula (09811) showed a 80% outflow cephalic vein stenosis with numerous collaterals present.  Dual upper arm drainage was patent but primarily through the medial vein structures; the central veins were patent as well.    We then had to cannulate again with a 21-gauge needle in the antegrade direction and exchanged out over the guidewire for a 6 French sheath.  With minor manipulation we were able to advance the angled Glidewire past the outflow stenosis and parked the tip of the catheter in the axillary vein.    7x8 Athletis -> 9x4 Athletis angioplasty balloons were then inserted over the guidewire and positioned at the cephalic vein outflow stenosis.  Balloons were upsized because there was still significant 50% residual stenosis after the 7 mm PTA.   Venous angioplasty (91478) was carried out to 24 ATM with FULL effacement of the waist on the balloon  at the cephalic vein lesion site. The repeat angiogram showed <30% residual stenosis with no evidence of extravasation or dissection.    Hemostasis: A 3-0 ethilon purse string suture were  placed at the cannulation site on removal of the sheaths.  Sedation: 1+1 mg Versed, 50+50 mcg Fentanyl. Sedation time: 24 minutes  Contrast. 12 mL  Monitoring: Because of the patient's comorbid conditions and sedation during the procedure, continuous EKG monitoring and O2 saturation monitoring was performed throughout the procedure by the RN. There were no abnormal arrhythmias encountered.  Complications: None  Diagnoses: I87.1 Stricture of vein  N18.6 ESRD T82.858A Stricture of access  Procedure Coding:  [phone] Cannulation and angiogram of fistula, venous angioplasty (cephalic  vein inflow)  Z3086 Contrast  Recommendations:  1. Continue to cannulate the fistula with 15G needles.  2. Refer for problems with flows/swelling. 3. Remove the suture next treatment.   Discharge: The patient was discharged home in stable condition. The patient was given education regarding the care of the dialysis access AVF and specific instructions in case of any problems.

## 2023-03-28 NOTE — Discharge Instructions (Signed)

## 2023-03-28 NOTE — H&P (Addendum)
 Chief Complaint: Decreased flows  Interval H&P  The patient has presented today for an angiogram/ angioplasty.  Various methods of treatment have been discussed with the patient.  After consideration of risk, benefits and other options for treatment, the patient has consented to a angiogram/ angioplasty with  possible stent placement.   Risks of angiogram with potential angioplasty and stenting if needed.contrast reaction, extravasation/ bleeding, dissection, hypotension and death were explained to the patient.  The patient's history has been reviewed and the patient has been examined, no changes in status.  Stable for angiogram/angioplasty  I have reviewed the patient's chart and labs.  Questions were answered to the patient's satisfaction.  Assessment/Plan: ESRD dialyzing MWF regimen with last dialysis Wed Decreased access flows a left Cimino fistula-patient does not remember where it was placed; I looked as far back as 10 years ago and I do not see any records in the [facility] system; he had the fistula placed in [place].  Planning on angiogram with possibly angioplasty. Renal osteodystrophy - continue binders per home regimen. Anemia - managed with ESA's and IV iron at dialysis center. HTN - resume home regimen.   HPI: Kirk [name] is an 63 y.o. male history of nonalcoholic liver disease, gout, hypertension, transplant previously, per lipidemia, OSA, ESRD.  Being referred for decreasing flows in his left forearm Cimino fistula.  ROS Per HPI.  Chemistry and CBC: Creatinine  Date/Time Value Ref Range Status  01/31/2023 02:14 PM 8.54 (HH) 0.61 - 1.24 mg/dL Final    Comment:    REPEATED TO VERIFY CRITICAL RESULT CALLED TO, READ BACK BY AND VERIFIED WITH: Porche Cates on [phone] at 1515 by [place]    07/24/2022 02:32 PM 9.87 (HH) 0.61 - 1.24 mg/dL Final    Comment:    REPEATED TO VERIFY CRITICAL RESULT CALLED TO, READ BACK BY AND VERIFIED WITH: JOY PURYEAR, LPN @3 :30 PM BY MARSHA  KERR (MT) 07/24/2022    03/06/2022 12:54 PM 9.46 (HH) 0.61 - 1.24 mg/dL Final    Comment:    REPEATED TO VERIFY CRITICAL RESULT CALLED TO, READ BACK BY AND VERIFIED WITH: Mannie Stabile at 1339 by Para Skeans (MT) 03/06/22    05/16/2021 04:11 PM 11.65 (HH) 0.61 - 1.24 mg/dL Final    Comment:    CRITICAL RESULT CALLED TO, READ BACK BY AND VERIFIED WITH: Dr. Duane Boston at 1656 on 25Apr23 by [facility].  Repeated to verify.    Creat  Date/Time Value Ref Range Status  01/29/2023 11:30 AM 10.02 (H) 0.70 - 1.35 mg/dL Final    Comment:    Verified by repeat analysis. Marland Kitchen   02/03/2018 03:23 PM 8.37 (H) 0.70 - 1.33 mg/dL Final    Comment:    Verified by repeat analysis. . For patients >78 years of age, the reference limit for Creatinine is approximately 13% higher for people identified as African-American. Marland Kitchen   02/14/2016 09:04 AM 11.87 (H) 0.70 - 1.33 mg/dL Final    Comment:    Result repeated and verified.   For patients > or = 61 years of age: The upper reference limit for Creatinine is approximately 13% higher for people identified as African-American.      Creatinine, Ser  Date/Time Value Ref Range Status  03/23/2023 02:30 AM 10.34 (H) 0.61 - 1.24 mg/dL Final  62/95/2841 32:44 AM 8.28 (H) 0.61 - 1.24 mg/dL Final  01/24/7251 66:44 AM 10.23 (H) 0.61 - 1.24 mg/dL Final  03/47/4259 56:38 AM 7.73 (H) 0.61 - 1.24 mg/dL Final  03/19/2023 03:06 AM 10.61 (H) 0.61 - 1.24 mg/dL Final  16/10/9602 54:09 AM 8.44 (H) 0.61 - 1.24 mg/dL Final  81/19/1478 29:56 AM 6.02 (H) 0.61 - 1.24 mg/dL Final  21/30/8657 84:69 AM 7.69 (H) 0.61 - 1.24 mg/dL Final  62/95/2841 32:44 AM 10.95 (H) 0.61 - 1.24 mg/dL Final  01/24/7251 66:44 AM 8.01 (H) 0.61 - 1.24 mg/dL Final  03/47/4259 56:38 AM 5.97 (H) 0.61 - 1.24 mg/dL Final  75/64/3329 51:88 PM 10.40 (H) 0.61 - 1.24 mg/dL Final  41/66/0630 16:01 AM 15.62 (H) 0.61 - 1.24 mg/dL Final  09/32/3557 32:20 AM 8.30 (H) 0.61 - 1.24 mg/dL Final  25/42/7062 37:62  PM 10.20 (H) 0.61 - 1.24 mg/dL Final  83/15/1761 60:73 AM 7.52 (H) 0.61 - 1.24 mg/dL Final  71/06/2692 85:46 AM 9.30 (H) 0.61 - 1.24 mg/dL Final  27/03/5007 38:18 AM 12.34 (H) 0.61 - 1.24 mg/dL Final  29/93/7169 67:89 AM 10.44 (H) 0.61 - 1.24 mg/dL Final  38/10/1749 02:58 AM 7.77 (H) 0.61 - 1.24 mg/dL Final  52/77/8242 35:36 AM 9.76 (H) 0.61 - 1.24 mg/dL Final  14/43/1540 08:67 AM 7.61 (H) 0.61 - 1.24 mg/dL Final  61/95/0932 67:12 AM 10.28 (H) 0.61 - 1.24 mg/dL Final  45/80/9983 38:25 AM 8.00 (H) 0.61 - 1.24 mg/dL Final  05/39/7673 41:93 AM 10.44 (H) 0.61 - 1.24 mg/dL Final  79/02/4095 35:32 AM 8.76 (H) 0.61 - 1.24 mg/dL Final  99/24/2683 41:96 PM 7.91 (H) 0.61 - 1.24 mg/dL Final  22/29/7989 21:19 PM 7.26 (H) 0.61 - 1.24 mg/dL Final  41/74/0814 48:18 AM 10.55 (H) 0.61 - 1.24 mg/dL Final  56/31/4970 26:37 PM 9.55 (H) 0.61 - 1.24 mg/dL Final  85/88/5027 74:12 AM 9.10 (H) 0.61 - 1.24 mg/dL Final  87/86/7672 09:47 PM 11.57 (H) 0.61 - 1.24 mg/dL Final  09/62/8366 29:47 AM 10.51 (H) 0.61 - 1.24 mg/dL Final  65/46/5035 46:56 PM 5.60 (H) 0.61 - 1.24 mg/dL Final  81/27/5170 01:74 AM 9.13 (H) 0.61 - 1.24 mg/dL Final  94/49/6759 16:38 AM 12.36 (H) 0.61 - 1.24 mg/dL Final  46/65/9935 70:17 AM 9.05 (H) 0.61 - 1.24 mg/dL Final  79/39/0300 92:33 AM 13.05 (H) 0.61 - 1.24 mg/dL Final  00/76/2263 33:54 PM 12.63 (H) 0.61 - 1.24 mg/dL Final  56/25/6389 37:34 AM 12.02 (H) 0.61 - 1.24 mg/dL Final  28/76/8115 72:62 PM 11.80 (H) 0.61 - 1.24 mg/dL Final  03/55/9741 63:84 PM 11.66 (H) 0.61 - 1.24 mg/dL Final  53/64/6803 21:22 AM 8.82 (HH) 0.40 - 1.50 mg/dL Final  48/25/0037 04:88 AM 15.97 (H) 0.61 - 1.24 mg/dL Final  89/16/9450 38:88 AM 13.05 (H) 0.61 - 1.24 mg/dL Final  28/00/3491 79:15 AM 10.25 (H) 0.61 - 1.24 mg/dL Final  05/69/7948 01:65 PM 7.84 (H) 0.61 - 1.24 mg/dL Final  53/74/8270 78:67 PM 6.51 (H) 0.61 - 1.24 mg/dL Final  54/49/2010 07:12 AM 13.39 (H) 0.61 - 1.24 mg/dL Final  19/75/8832 54:98 PM  9.13 (H) 0.61 - 1.24 mg/dL Final    Comment:    DIALYSIS  12/03/2018 03:33 AM 13.84 (H) 0.61 - 1.24 mg/dL Final  26/41/5830 94:07 PM 12.83 (H) 0.61 - 1.24 mg/dL Final   Recent Labs  Lab 03/22/23 0232 03/23/23 0230  NA 136 134*  K 4.4 4.1  CL 95* 95*  CO2 27 24  GLUCOSE 103* 105*  BUN 37* 50*  CREATININE 8.28* 10.34*  CALCIUM 9.7 9.3  PHOS 4.2 4.9*   Recent Labs  Lab 03/22/23 0232 03/23/23 0230  WBC 5.3 4.5  NEUTROABS 4.1  --  HGB 10.0* 10.1*  HCT 30.1* 30.5*  MCV 95.0 96.8  PLT 101* 86*   Liver Function Tests: Recent Labs  Lab 03/22/23 0232 03/23/23 0230  ALBUMIN 3.2* 3.2*   No results for input(s): "LIPASE", "AMYLASE" in the last 168 hours. No results for input(s): "AMMONIA" in the last 168 hours. Cardiac Enzymes: No results for input(s): "CKTOTAL", "CKMB", "CKMBINDEX", "TROPONINI" in the last 168 hours. Iron Studies: No results for input(s): "IRON", "TIBC", "TRANSFERRIN", "FERRITIN" in the last 72 hours. PT/INR: @LABRCNTIP (inr:5)  Xrays/Other Studies: )No results found. However, due to the size of the patient record, not all encounters were searched. Please check Results Review for a complete set of results. No results found.  PMH:   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)    Anxiety    Asthma    Asthma    moderate persistent   Carpal tunnel syndrome    Cellulitis and abscess of trunk    Cholelithiasis 07/13/2014   Chronic headaches    Cigarette smoker 07/11/2017   Stopped 2/022  - referred to start Lung cancer screening 04/2021         Debility, unspecified    Dermatophytosis of the body    Dysrhythmia    history of   Edema    End stage renal disease on dialysis [facility])    "MWF; [facility] in [place]" (10/21/2014)   Essential hypertension, benign    Generalized anxiety disorder  05/25/2022   GERD (gastroesophageal reflux disease)    Gout, unspecified    HTN (hypertension)    Hypertrophy of prostate without urinary obstruction and other lower urinary tract symptoms (LUTS)    Hypotension, unspecified    Hypoxia 03/21/2023   Impotence of organic origin    Insomnia, unspecified    Kidney replaced by transplant    Localization-related (focal) (partial) epilepsy and epileptic syndromes with complex partial seizures, without mention of intractable epilepsy    12-15-19- Wife states he has NEVER had a seizure    Lumbago    Memory loss    OSA on CPAP    Other and unspecified hyperlipidemia    controlled /managed per wife    Other chronic nonalcoholic liver disease    Other malaise and fatigue    Other nonspecific abnormal serum enzyme levels    Pain in joint, lower leg    Pain in joint, upper arm    Pneumonia "several times"   PONV (postoperative nausea and vomiting)    Renal dialysis status(V45.11) 02/05/2010   restarted 01/02/13 ofter renal trransplant failure   Secondary hyperparathyroidism (of renal origin)    Shortness of breath    Sleep apnea    wears cpap    Tension headache    Unspecified constipation    Unspecified essential hypertension    Unspecified hereditary and idiopathic peripheral neuropathy    Unspecified vitamin D deficiency     PSH:   Past Surgical History:  Procedure Laterality Date   AV FISTULA PLACEMENT Left ?2010   "forearm; at [facility]"   BACK SURGERY     CARDIAC CATHETERIZATION  03/21/2011   CHOLECYSTECTOMY N/A 10/21/2014   Procedure: LAPAROSCOPIC CHOLECYSTECTOMY WITH INTRAOPERATIVE CHOLANGIOGRAM;  Surgeon: Chevis Pretty III, MD;  Location: [facility] OR;  Service: General;  Laterality: N/A;   COLONOSCOPY     INNER  EAR SURGERY 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   IR ANGIO INTRA EXTRACRAN SEL INTERNAL CAROTID UNI R MOD SED  11/30/2021   IR ANGIOGRAM FOLLOW UP STUDY  11/30/2021   IR AV DIALY SHUNT INTRO NEEDLE/INTRACATH INITIAL  W/PTA/IMG LEFT  12/13/2022   IR BONE MARROW BIOPSY & ASPIRATION  03/27/2022   IR NEURO EACH ADD'L AFTER BASIC UNI RIGHT (MS)  11/30/2021   IR TRANSCATH/EMBOLIZ  11/30/2021   IR US GUIDE VASC ACCESS LEFT  12/13/2022   KIDNEY TRANSPLANT  08/17/2011   [facility]    LAPAROSCOPIC CHOLECYSTECTOMY  10/21/2014   w/IOC   LEFT HEART CATHETERIZATION WITH CORONARY ANGIOGRAM N/A 03/21/2011   Procedure: LEFT HEART CATHETERIZATION WITH CORONARY ANGIOGRAM;  Surgeon: Chrystie Nose, MD;  Location: [facility] CATH LAB;  Service: Cardiovascular;  Laterality: N/A;   NEPHRECTOMY  08/2013   removed transplaned kidney   POLYPECTOMY     POSTERIOR FUSION CERVICAL SPINE  06/25/2012   for spinal stenosis   RADIOLOGY WITH ANESTHESIA N/A 11/30/2021   Procedure: RIGHT MIDDLE MENINGEAL ARTERY EMBOLIZATION;  Surgeon: Radiologist, Medication, MD;  Location: [facility] OR;  Service: Radiology;  Laterality: N/A;   VASECTOMY  2010    Allergies:  Allergies  Allergen Reactions   Codeine Nausea And Vomiting   Hydrocodone-Acetaminophen Nausea And Vomiting    Medications:   Prior to Admission medications   Medication Sig Start Date End Date Taking? Authorizing Provider  acetaminophen (TYLENOL) 500 MG tablet Take 1 tablet (500 mg total) by mouth every 6 (six) hours as needed for moderate pain. 09/12/14   Standley Brooking, MD  albuterol (PROVENTIL) (2.5 MG/3ML) 0.083% nebulizer solution Take 3 mLs (2.5 mg total) by nebulization every 6 (six) hours as needed for wheezing or shortness of breath. 11/13/22   Glenford Bayley, NP  albuterol (VENTOLIN HFA) 108 (90 Base) MCG/ACT inhaler Inhale 2 puffs into the lungs every 6 (six) hours as needed for wheezing or shortness of breath. 10/29/22   Nyoka Cowden, MD  amLODipine (NORVASC) 10 MG tablet TAKE 1 TABLET BY MOUTH EVERY DAY 12/05/22   Ngetich, Dinah C, NP  aspirin 81 MG chewable tablet Chew 81 mg by mouth daily. 09/28/20   [provider]  Cholecalciferol (VITAMIN D3) 25 MCG (1000 UT)  CAPS Take 4,000 Units by mouth daily. Take at home on Tues, Thurs, Sat, and Sun    [provider]  doxazosin (CARDURA) 4 MG tablet Take 1 tablet (4 mg total) by mouth at bedtime. 03/23/23   Shitarev, Dimitry, MD  DULoxetine (CYMBALTA) 60 MG capsule Take 60 mg by mouth daily.    [provider]  fluticasone furoate-vilanterol (BREO ELLIPTA) 200-25 MCG/ACT AEPB Inhale 1 puff into the lungs in the morning and at bedtime. 03/23/23   Shitarev, Dimitry, MD  guaiFENesin (MUCINEX) 600 MG 12 hr tablet Take 1,200 mg by mouth 2 (two) times daily as needed for cough or to loosen phlegm.    [provider]  hydrALAZINE (APRESOLINE) 50 MG tablet Take 1 tablet (50 mg total) by mouth 3 (three) times daily. 03/23/23   Shitarev, Dimitry, MD  isosorbide mononitrate (IMDUR) 60 MG 24 hr tablet Take 60 mg by mouth daily. 10/04/22   [provider]  metoprolol tartrate (LOPRESSOR) 50 MG tablet Take 1 tablet (50 mg total) by mouth 2 (two) times daily. 03/23/23   Shitarev, Dimitry, MD  multivitamin (RENA-VIT) TABS tablet Take 1 tablet by mouth daily. 01/29/23   Ngetich, Donalee Citrin, NP  ondansetron (ZOFRAN-ODT) 4 MG disintegrating tablet TAKE 1 TABLET BY MOUTH EVERY 8 HOURS AS NEEDED FOR NAUSEA AND VOMITING 10/02/22   Ngetich, Dinah C, NP  pantoprazole (PROTONIX) 40 MG tablet Take 1 tablet (40 mg total) by mouth daily. 03/23/23   Shitarev, Dimitry, MD  pravastatin (PRAVACHOL) 40 MG tablet TAKE 1 TABLET BY MOUTH EVERY DAY IN THE EVENING 12/04/22   Ngetich, Dinah C, NP  traZODone (DESYREL) 50 MG tablet Take 0.5-1 tablets (25-50 mg total) by mouth at bedtime as needed. for sleep Patient taking differently: Take 50 mg by mouth at bedtime as needed for sleep. for sleep 05/22/22   Ngetich, Dinah C, NP  umeclidinium bromide (INCRUSE ELLIPTA) 62.5 MCG/ACT AEPB Inhale 1 puff into the lungs daily. 03/24/23   Shitarev, Dimitry, MD  VELPHORO 500 MG chewable tablet Chew 1,000 mg by mouth 3 (three) times daily. 02/06/23    [provider]    Discontinued Meds:  There are no discontinued medications.  Social History:  reports that he quit smoking about 3 years ago. His smoking use included cigarettes. He started smoking about 32 years ago. He has a 16 pack-year smoking history. He has never used smokeless tobacco. He reports that he does not drink alcohol and does not use drugs.  Family History:   Family History  Adopted: Yes  Problem Relation Age of Onset   Colon cancer Neg Hx    Esophageal cancer Neg Hx    Rectal cancer Neg Hx    Stomach cancer Neg Hx    Colon polyps Neg Hx     Blood pressure (!) 142/67, pulse 64, resp. rate 14, SpO2 96%. GEN: NAD, A&Ox3, NCAT HEENT: No conjunctival pallor, EOMI NECK: Supple, no thyromegaly LUNGS: CTA B/L no rales, rhonchi or wheezing CV: RRR, No M/R/G ABD: SNDNT +BS  EXT: No lower extremity edema ACCESS: lt Cimino pulsatile in the arterial limb focally       Dorothea Yow, Len Blalock, MD 03/28/2023, 10:50 AM

## 2023-03-29 NOTE — Telephone Encounter (Signed)
 Yvette LM to call back to schedule sooner appt (03/29/23)

## 2023-03-30 DIAGNOSIS — Z992 Dependence on renal dialysis: Secondary | ICD-10-CM | POA: Diagnosis not present

## 2023-03-30 DIAGNOSIS — D509 Iron deficiency anemia, unspecified: Secondary | ICD-10-CM | POA: Diagnosis not present

## 2023-03-30 DIAGNOSIS — D631 Anemia in chronic kidney disease: Secondary | ICD-10-CM | POA: Diagnosis not present

## 2023-03-30 DIAGNOSIS — N2581 Secondary hyperparathyroidism of renal origin: Secondary | ICD-10-CM | POA: Diagnosis not present

## 2023-03-30 DIAGNOSIS — N186 End stage renal disease: Secondary | ICD-10-CM | POA: Diagnosis not present

## 2023-04-01 DIAGNOSIS — D631 Anemia in chronic kidney disease: Secondary | ICD-10-CM | POA: Diagnosis not present

## 2023-04-01 DIAGNOSIS — N2581 Secondary hyperparathyroidism of renal origin: Secondary | ICD-10-CM | POA: Diagnosis not present

## 2023-04-01 DIAGNOSIS — Z992 Dependence on renal dialysis: Secondary | ICD-10-CM | POA: Diagnosis not present

## 2023-04-01 DIAGNOSIS — N186 End stage renal disease: Secondary | ICD-10-CM | POA: Diagnosis not present

## 2023-04-01 DIAGNOSIS — D509 Iron deficiency anemia, unspecified: Secondary | ICD-10-CM | POA: Diagnosis not present

## 2023-04-01 NOTE — Telephone Encounter (Signed)
 Patient scheduled for appointment 04/02/23

## 2023-04-02 ENCOUNTER — Ambulatory Visit (INDEPENDENT_AMBULATORY_CARE_PROVIDER_SITE_OTHER): Payer: Medicare Other | Admitting: Family

## 2023-04-02 ENCOUNTER — Encounter (HOSPITAL_BASED_OUTPATIENT_CLINIC_OR_DEPARTMENT_OTHER): Payer: Self-pay | Admitting: Family

## 2023-04-02 ENCOUNTER — Ambulatory Visit (INDEPENDENT_AMBULATORY_CARE_PROVIDER_SITE_OTHER): Admitting: Family

## 2023-04-02 ENCOUNTER — Encounter: Payer: Self-pay | Admitting: Family

## 2023-04-02 VITALS — BP 136/80 | HR 60 | Temp 97.7°F | Resp 20 | Ht 67.0 in | Wt 189.6 lb

## 2023-04-02 VITALS — BP 130/60 | HR 64 | Ht 67.0 in | Wt 190.4 lb

## 2023-04-02 DIAGNOSIS — E1121 Type 2 diabetes mellitus with diabetic nephropathy: Secondary | ICD-10-CM

## 2023-04-02 DIAGNOSIS — I10 Essential (primary) hypertension: Secondary | ICD-10-CM

## 2023-04-02 DIAGNOSIS — R197 Diarrhea, unspecified: Secondary | ICD-10-CM

## 2023-04-02 DIAGNOSIS — I25118 Atherosclerotic heart disease of native coronary artery with other forms of angina pectoris: Secondary | ICD-10-CM | POA: Diagnosis not present

## 2023-04-02 DIAGNOSIS — E782 Mixed hyperlipidemia: Secondary | ICD-10-CM | POA: Diagnosis not present

## 2023-04-02 DIAGNOSIS — I480 Paroxysmal atrial fibrillation: Secondary | ICD-10-CM | POA: Insufficient documentation

## 2023-04-02 DIAGNOSIS — J449 Chronic obstructive pulmonary disease, unspecified: Secondary | ICD-10-CM

## 2023-04-02 DIAGNOSIS — I5022 Chronic systolic (congestive) heart failure: Secondary | ICD-10-CM | POA: Diagnosis not present

## 2023-04-02 DIAGNOSIS — N186 End stage renal disease: Secondary | ICD-10-CM

## 2023-04-02 DIAGNOSIS — I428 Other cardiomyopathies: Secondary | ICD-10-CM | POA: Diagnosis not present

## 2023-04-02 DIAGNOSIS — F411 Generalized anxiety disorder: Secondary | ICD-10-CM

## 2023-04-02 DIAGNOSIS — D638 Anemia in other chronic diseases classified elsewhere: Secondary | ICD-10-CM

## 2023-04-02 DIAGNOSIS — R569 Unspecified convulsions: Secondary | ICD-10-CM | POA: Diagnosis not present

## 2023-04-02 DIAGNOSIS — E785 Hyperlipidemia, unspecified: Secondary | ICD-10-CM | POA: Diagnosis not present

## 2023-04-02 DIAGNOSIS — I120 Hypertensive chronic kidney disease with stage 5 chronic kidney disease or end stage renal disease: Secondary | ICD-10-CM

## 2023-04-02 DIAGNOSIS — F339 Major depressive disorder, recurrent, unspecified: Secondary | ICD-10-CM | POA: Diagnosis not present

## 2023-04-02 NOTE — Progress Notes (Signed)
 Provider: Richarda Blade FNP-C   Beola Vasallo, Donalee Citrin, NP  Patient Care Team: Hargun Spurling, Donalee Citrin, NP as PCP - General (Family Medicine) Rennis Golden Lisette Abu, MD as PCP - Cardiology (Cardiology) Griselda Miner, MD as Consulting Physician (General Surgery) Coralyn Helling, MD (Inactive) as Consulting Physician (Pulmonary Disease) Lauris Poag, MD as Consulting Physician (Nephrology) Sherren Kerns, MD (Inactive) as Consulting Physician (Vascular Surgery) Chrystie Nose, MD as Consulting Physician (Cardiology) Rachael Fee, MD (Inactive) as Attending Physician (Gastroenterology) Camille Bal, MD as Consulting Physician (Nephrology) Clarene Duke Karma Lew, RN as [facility] Care Management  Extended Emergency Contact Information Primary Emergency Contact: Jasmine Awe Address: [address]          [place], [place] [address] D[place] of [place] Home Phone: [phone] Mobile Phone: [phone] Relation: Spouse  Code Status:  Full Code  Goals of care: Advanced Directive information    04/02/2023   11:05 AM  Advanced Directives  Does Patient Have a Medical Advance Directive? No  Would patient like information on creating a medical advance directive? No - Patient declined     Chief Complaint  Patient presents with   Medical Management of Chronic Issues    6 month follow up and discuss shingles and covid vaccines.    HPI:   Discussed the use of AI scribe software for clinical note transcription with the patient, who gave verbal consent to proceed.  History of Present Illness   Pt is a 63 yr male seen today for 6 months follow up for medical management of chronic diseases.   Recently, he underwent a fistulogram, which is now functioning well. There was confusion regarding the need for a heart catheterization during hospitalization when he went for his Fistula, which has not yet been scheduled but has upcoming appointment with his Cardiologist this afternoon.   He is  undergoing regular dialysis and has lab work done twice a month. Recent lab work on March 1st showed stable hemoglobin levels at 10.1, with previous levels at 10.0, 9.0, and 8.8, indicating an upward trend. There were concerns about potential bleeding due to anemia, but wife reports a stool sample was mishandled at the hospital, and no results were obtained. He experiences occasional loose stools but not frequently. A swallow study was performed in the hospital.He request  referral to a gastroenterologist to Dr.Armbrewster at [facility] to address diarrhea and potential bleeding issues.  He has experienced significant weight loss, dropping from 201 lbs to 189.6 lbs, attributed to a recent illness. He is eating well and consuming protein before dialysis.  He had a slight fall a couple of weeks before his recent hospital stay, which has been challenging. He experiences weakness at times but is regaining strength and is trying to walk and exercise more. He has some knee pain but no swelling.  His current medications include amlodipine 10 mg daily, hydralazine 50 mg three times daily, metoprolol 50 mg twice daily, Protonix, Ellipta, Incruse, Breo, doxazosin 4 mg at bedtime, Mucinex 1200 mg twice daily as needed, Cymbalta 60 mg daily, albuterol inhaler and solution, Imdur, vitamin D, aspirin, Tylenol as needed, and trazodone 50 mg at bedtime. He is not currently taking Velphoro, which was discontinued in the hospital, and there is uncertainty about whether it should be resumed.He will discuss with his nephrologist.   No recent chest pain and has not used Zofran for nausea.  Immunization is up to date except due for COVID-19 and Shingles vaccine.Wife states supposed to be  given COVID-19 vaccine at the dialysis Center.  Past Medical History:  Diagnosis Date   Acute edema of lung, unspecified    Acute, but ill-defined, cerebrovascular disease    Allergy    Anemia    Anemia in chronic kidney  disease(285.21)    Anxiety    Asthma    Asthma    moderate persistent   Carpal tunnel syndrome    Cellulitis and abscess of trunk    Cholelithiasis 07/13/2014   Chronic headaches    Cigarette smoker 07/11/2017   Stopped 2/022  - referred to start Lung cancer screening 04/2021         Debility, unspecified    Dermatophytosis of the body    Dysrhythmia    history of   Edema    End stage renal disease on dialysis [facility])    "MWF; [facility] in [place]" (10/21/2014)   Essential hypertension, benign    Generalized anxiety disorder 05/25/2022   GERD (gastroesophageal reflux disease)    Gout, unspecified    HTN (hypertension)    Hypertrophy of prostate without urinary obstruction and other lower urinary tract symptoms (LUTS)    Hypotension, unspecified    Hypoxia 03/21/2023   Impotence of organic origin    Insomnia, unspecified    Kidney replaced by transplant    Localization-related (focal) (partial) epilepsy and epileptic syndromes with complex partial seizures, without mention of intractable epilepsy    12-15-19- Wife states he has NEVER had a seizure    Lumbago    Memory loss    OSA on CPAP    Other and unspecified hyperlipidemia    controlled /managed per wife    Other chronic nonalcoholic liver disease    Other malaise and fatigue    Other nonspecific abnormal serum enzyme levels    Pain in joint, lower leg    Pain in joint, upper arm    Pneumonia "several times"   PONV (postoperative nausea and vomiting)    Renal dialysis status(V45.11) 02/05/2010   restarted 01/02/13 ofter renal trransplant failure   Secondary hyperparathyroidism (of renal origin)    Shortness of breath    Sleep apnea    wears cpap    Tension headache    Unspecified constipation    Unspecified essential hypertension    Unspecified hereditary and idiopathic peripheral neuropathy    Unspecified vitamin D deficiency    Past Surgical History:  Procedure Laterality Date   A/V FISTULAGRAM N/A  03/28/2023   Procedure: A/V Fistulagram;  Surgeon: Ethelene Hal, MD;  Location: [facility] INVASIVE CV LAB;  Service: Cardiovascular;  Laterality: N/A;   AV FISTULA PLACEMENT Left ?2010   "forearm; at [facility]"   BACK SURGERY     CARDIAC CATHETERIZATION  03/21/2011   CHOLECYSTECTOMY N/A 10/21/2014   Procedure: LAPAROSCOPIC CHOLECYSTECTOMY WITH INTRAOPERATIVE CHOLANGIOGRAM;  Surgeon: Chevis Pretty III, MD;  Location: [facility] OR;  Service: General;  Laterality: N/A;   COLONOSCOPY     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   IR ANGIO INTRA EXTRACRAN SEL INTERNAL CAROTID UNI R MOD SED  11/30/2021   IR ANGIOGRAM FOLLOW UP STUDY  11/30/2021   IR AV DIALY SHUNT INTRO NEEDLE/INTRACATH INITIAL W/PTA/IMG LEFT  12/13/2022   IR BONE MARROW BIOPSY & ASPIRATION  03/27/2022   IR NEURO EACH ADD'L AFTER BASIC UNI RIGHT (MS)  11/30/2021   IR TRANSCATH/EMBOLIZ  11/30/2021   IR US GUIDE VASC ACCESS LEFT  12/13/2022   KIDNEY TRANSPLANT  08/17/2011  [facility]    LAPAROSCOPIC CHOLECYSTECTOMY  10/21/2014   w/IOC   LEFT HEART CATHETERIZATION WITH CORONARY ANGIOGRAM N/A 03/21/2011   Procedure: LEFT HEART CATHETERIZATION WITH CORONARY ANGIOGRAM;  Surgeon: Chrystie Nose, MD;  Location: [facility] CATH LAB;  Service: Cardiovascular;  Laterality: N/A;   NEPHRECTOMY  08/2013   removed transplaned kidney   PERIPHERAL VASCULAR BALLOON ANGIOPLASTY Left 03/28/2023   Procedure: PERIPHERAL VASCULAR BALLOON ANGIOPLASTY;  Surgeon: Ethelene Hal, MD;  Location: [facility] INVASIVE CV LAB;  Service: Cardiovascular;  Laterality: Left;  80% outflow cephalic   POLYPECTOMY     POSTERIOR FUSION CERVICAL SPINE  06/25/2012   for spinal stenosis   RADIOLOGY WITH ANESTHESIA N/A 11/30/2021   Procedure: RIGHT MIDDLE MENINGEAL ARTERY EMBOLIZATION;  Surgeon: Radiologist, Medication, MD;  Location: [facility] OR;  Service: Radiology;  Laterality: N/A;   VASECTOMY  2010    Allergies  Allergen Reactions   Codeine Nausea And Vomiting   Hydrocodone-Acetaminophen  Nausea And Vomiting    Allergies as of 04/02/2023       Reactions   Codeine Nausea And Vomiting   Hydrocodone-acetaminophen Nausea And Vomiting        Medication List        Accurate as of April 02, 2023 11:59 AM. If you have any questions, ask your nurse or doctor.          acetaminophen 500 MG tablet Commonly known as: TYLENOL Take 1 tablet (500 mg total) by mouth every 6 (six) hours as needed for moderate pain.   albuterol 108 (90 Base) MCG/ACT inhaler Commonly known as: VENTOLIN HFA Inhale 2 puffs into the lungs every 6 (six) hours as needed for wheezing or shortness of breath.   albuterol (2.5 MG/3ML) 0.083% nebulizer solution Commonly known as: PROVENTIL Take 3 mLs (2.5 mg total) by nebulization every 6 (six) hours as needed for wheezing or shortness of breath.   amLODipine 10 MG tablet Commonly known as: NORVASC TAKE 1 TABLET BY MOUTH EVERY DAY   aspirin 81 MG chewable tablet Chew 81 mg by mouth daily.   doxazosin 4 MG tablet Commonly known as: CARDURA Take 1 tablet (4 mg total) by mouth at bedtime.   DULoxetine 60 MG capsule Commonly known as: CYMBALTA Take 60 mg by mouth daily.   fluticasone furoate-vilanterol 200-25 MCG/ACT Aepb Commonly known as: Breo Ellipta Inhale 1 puff into the lungs in the morning and at bedtime.   guaiFENesin 600 MG 12 hr tablet Commonly known as: MUCINEX Take 1,200 mg by mouth 2 (two) times daily as needed for cough or to loosen phlegm.   hydrALAZINE 50 MG tablet Commonly known as: APRESOLINE Take 1 tablet (50 mg total) by mouth 3 (three) times daily.   Incruse Ellipta 62.5 MCG/ACT Aepb Generic drug: umeclidinium bromide Inhale 1 puff into the lungs daily.   isosorbide mononitrate 60 MG 24 hr tablet Commonly known as: IMDUR Take 60 mg by mouth daily.   metoprolol tartrate 50 MG tablet Commonly known as: LOPRESSOR Take 1 tablet (50 mg total) by mouth 2 (two) times daily.   multivitamin Tabs tablet Take 1 tablet  by mouth daily.   ondansetron 4 MG disintegrating tablet Commonly known as: ZOFRAN-ODT TAKE 1 TABLET BY MOUTH EVERY 8 HOURS AS NEEDED FOR NAUSEA AND VOMITING   pantoprazole 40 MG tablet Commonly known as: PROTONIX Take 1 tablet (40 mg total) by mouth daily.   pravastatin 40 MG tablet Commonly known as: PRAVACHOL TAKE 1 TABLET BY MOUTH EVERY DAY IN  THE EVENING   traZODone 50 MG tablet Commonly known as: DESYREL Take 0.5-1 tablets (25-50 mg total) by mouth at bedtime as needed. for sleep What changed:  how much to take reasons to take this   Velphoro 500 MG chewable tablet Generic drug: sucroferric oxyhydroxide Chew 1,000 mg by mouth 3 (three) times daily.   Vitamin D3 25 MCG (1000 UT) Caps Take 4,000 Units by mouth daily. Take at home on Tues, Thurs, Sat, and Sun        Review of Systems  Constitutional:  Negative for appetite change, chills, fatigue, fever and unexpected weight change.  HENT:  Positive for hearing loss. Negative for congestion, ear discharge, ear pain, nosebleeds, postnasal drip, rhinorrhea, sinus pressure, sinus pain, sneezing, sore throat, tinnitus and trouble swallowing.   Eyes:  Negative for pain, discharge, redness, itching and visual disturbance.  Respiratory:  Negative for cough, chest tightness, shortness of breath and wheezing.   Cardiovascular:  Negative for chest pain, palpitations and leg swelling.       Left forearm AV fistula   Gastrointestinal:  Negative for abdominal distention, abdominal pain, blood in stool, constipation, diarrhea, nausea and vomiting.  Endocrine: Negative for cold intolerance, heat intolerance, polydipsia, polyphagia and polyuria.  Genitourinary:  Negative for difficulty urinating, dysuria, flank pain, frequency and urgency.       On dialysis three times per week    Musculoskeletal:  Positive for arthralgias and back pain. Negative for gait problem, joint swelling, myalgias, neck pain and neck stiffness.  Skin:  Negative  for color change, pallor, rash and wound.  Neurological:  Negative for dizziness, syncope, speech difficulty, weakness, light-headedness, numbness and headaches.  Hematological:  Does not bruise/bleed easily.  Psychiatric/Behavioral:  Negative for agitation, behavioral problems, confusion, hallucinations, self-injury, sleep disturbance and suicidal ideas. The patient is not nervous/anxious.     Immunization History  Administered Date(s) Administered   Influenza Whole 11/01/2010   Influenza, Mdck, Trivalent,PF 6+ MOS(egg free) 10/24/2022, 11/19/2022   Influenza, Seasonal, Injecte, Preservative Fre 10/29/2012   Influenza-Unspecified 11/21/2011, 11/05/2012, 09/22/2013, 09/22/2017, 11/23/2019, 10/28/2020, 12/04/2020   Moderna Sars-Covid-2 Vaccination 04/06/2019, 05/07/2019, 03/31/2020   PPD Test 12/26/2012   Pneumococcal Polysaccharide-23 11/01/2010, 10/29/2012, 11/23/2022, 11/30/2022   Pneumococcal-Unspecified 11/05/2012   Tdap 01/22/2006, 06/08/2021   Pertinent  Health Maintenance Due  Topic Date Due   HEMOGLOBIN A1C  07/29/2023   Colonoscopy  12/29/2026   INFLUENZA VACCINE  Completed   FOOT EXAM  Discontinued   OPHTHALMOLOGY EXAM  Discontinued      02/28/2022    3:52 PM 09/18/2022    9:07 AM 10/02/2022   11:25 AM 01/29/2023   10:38 AM 02/28/2023    9:28 AM  Fall Risk  Falls in the past year? 1 1 0 1 1  Was there an injury with Fall? 1 0  0 0  Fall Risk Category Calculator 3 2  2 2   Patient at Risk for Falls Due to No Fall Risks History of fall(s)  History of fall(s)   Fall risk Follow up Falls evaluation completed Falls evaluation completed  Falls evaluation completed    Functional Status Survey:    Vitals:   04/02/23 1110  BP: 136/80  Pulse: 60  Resp: 20  Temp: 97.7 F (36.5 C)  SpO2: 96%  Weight: 189 lb 9.6 oz (86 kg)  Height: 5\' 7"  (1.702 m)   Body mass index is 29.7 kg/m. Physical Exam  VITALS: T- 97.7, P- 60, BP- 136/80, SaO2- 96% MEASUREMENTS: Weight-  189.6.  GENERAL: Alert, cooperative, well developed, no acute distress. HEENT: Normocephalic, normal oropharynx, moist mucous membranes. CHEST: Clear to auscultation bilaterally, no wheezes, rhonchi, or crackles. CARDIOVASCULAR: Normal heart rate and rhythm, S1 and S2 normal without murmurs. ABDOMEN: Soft, non-tender, non-distended, without organomegaly, normal bowel sounds. EXTREMITIES: No cyanosis or edema. MUSCULOSKELETAL: Knee with some pain with ROM, no crepitus,not swollen and no erythema  NEUROLOGICAL: Cranial nerves grossly intact, moves all extremities without gross motor or sensory deficit.  PSYCHIATRY/BEHAVIORAL : Mood stable   Labs reviewed: Recent Labs    03/21/23 0258 03/22/23 0232 03/23/23 0230  NA 133* 136 134*  K 4.9 4.4 4.1  CL 94* 95* 95*  CO2 24 27 24   GLUCOSE 128* 103* 105*  BUN 54* 37* 50*  CREATININE 10.23* 8.28* 10.34*  CALCIUM 9.7 9.7 9.3  MG 2.3 2.3 2.2  PHOS 4.5 4.2 4.9*   Recent Labs    01/31/23 1414 03/17/23 0726 03/17/23 0730 03/18/23 0236 03/18/23 0238 03/21/23 0258 03/22/23 0232 03/23/23 0230  AST 14* 33  --  29  --   --   --   --   ALT 7 24  --  19  --   --   --   --   ALKPHOS 69 69  --  51  --   --   --   --   BILITOT 0.6 0.9  --  0.9  --   --   --   --   PROT 7.1 7.7  --  6.1*  --   --   --   --   ALBUMIN 4.1 3.8   < > 3.0*   < > 3.2* 3.2* 3.2*   < > = values in this interval not displayed.   Recent Labs    03/20/23 0239 03/21/23 0258 03/22/23 0232 03/23/23 0230  WBC 3.0* 3.3* 5.3 4.5  NEUTROABS 2.1 2.4 4.1  --   HGB 8.8* 9.0* 10.0* 10.1*  HCT 26.6* 27.6* 30.1* 30.5*  MCV 95.0 95.5 95.0 96.8  PLT 73* 78* 101* 86*   Lab Results  Component Value Date   TSH 2.245 11/16/2021   Lab Results  Component Value Date   HGBA1C 5.2 01/29/2023   Lab Results  Component Value Date   CHOL 114 05/05/2022   HDL 41 05/05/2022   LDLCALC 56 05/05/2022   TRIG 86 05/05/2022   CHOLHDL 2.8 05/05/2022    Significant Diagnostic Results  in last 30 days:  DG ESOPHAGUS W SINGLE CM (SOL OR THIN BA) Result Date: 03/22/2023 CLINICAL DATA:  Esophageal dysphagia. Sensation of food bolus stuck in the lower esophagus/epigastric region. Request for barium esophagram. EXAM: ESOPHAGUS/BARIUM SWALLOW/TABLET STUDY TECHNIQUE: Single contrast examination was performed using thin liquid barium. This exam was performed by Brayton El PA-C , and was supervised and interpreted by Dr. Oley Balm. FLUOROSCOPY: Radiation Exposure Index (as provided by the fluoroscopic device): 35.90 mGy Kerma COMPARISON:  None Available. FINDINGS: Pharynx: Limited evaluation but no discrete evidence of aspiration. Esophagus: No definitive mucosal lesions. Possible short-segment narrowing or distal esophageal ring. Esophageal motility: Within normal limits. Hiatal Hernia: None. Gastroesophageal reflux: None visualized, even with provocative maneuvers such as cough and Valsalva. Ingested 13 mm barium tablet: Became stuck in the distal esophagus, possibly indicating stenosis. IMPRESSION: No definitive mucosal lesions. Possible short-segment narrowing or distal esophageal ring which caused stoppage of a 13 mm barium tablet. No dysmotility. No evidence of hiatal hernia or gastroesophageal reflux identified. Electronically Signed   By: Corlis Leak  M.D.   On: 03/22/2023 15:30   DG Chest Portable 1 View Result Date: 03/21/2023 CLINICAL DATA:  Shortness of breath EXAM: PORTABLE CHEST 1 VIEW COMPARISON:  Chest radiograph 03/18/2023 FINDINGS: Cervical spinal fusion hardware. Monitoring leads overlie the patient. Cardiomegaly. Pulmonary vascular redistribution. No large area pulmonary consolidation. No pleural effusion or pneumothorax. Thoracic spine degenerative changes. IMPRESSION: Cardiomegaly with pulmonary vascular redistribution. Electronically Signed   By: Annia Belt M.D.   On: 03/21/2023 10:13   DG Swallowing Func-Speech Pathology Result Date: 03/20/2023 Table formatting from  the original result was not included. Modified Barium Swallow Study Patient Details Name: MAALIK PINN MRN: [id number] Date of Birth: 1960-03-17 Today's Date: 03/20/2023 HPI/PMH: HPI: Patient is a 63 y.o. male with PMH: ESRD on HD (MWF), Al[place] syndrome s/p kidney transplant, metastatic breast cancer, chronic pancytopenia, recurrent pneumonia, OSA on CPAP, CAD, mild persistent asthma, systolic heart failure, GI bleed, subdural hematoma, HTN and DM-2. He presented to the hospital on 03/16/23 with acute hypoxic respiratory failure. He was placed on BiPAP morning of 2/23 due to mild increase in WOB/SOB then transitioned to CPAP as needed. He was made NPO awaiting SLP swallow evaluation secondary to concern for aspiration. Clinical Impression: Clinical Impression: Pt's oropharyngeal swallow is WFL. He fairly consistently had c/o boluses sticking in his chest throughout the study (even with liquids), but when scanning the esophagus multiple times, there was never any visible barium. At one point, with a bite of puree, pt stated that he "choked" but with clarifying questions, it was gathered that he felt like it was stuck in his chest. Recommend that he resume his baseline diet. He says that he does have a little trouble chewing as his dentures don't fit well, but he prefers a regular diet that would allow him to have full range of the menu to select what he feels like he can swallow. Will continue thin liquids. SLP will f/u briefly for education but do not anticipate ongoing SLP needs beyond this acute stay. DIGEST Swallow Severity Rating*             Safety: 0             Efficiency: 0             Overall Pharyngeal Swallow Severity: 0 1: mild; 2: moderate; 3: severe; 4: profound *The Dynamic Imaging Grade of Swallowing Toxicity is standardized for the head and neck cancer population, however, demonstrates promising clinical applications across populations to standardize the clinical rating of pharyngeal swallow safety  and severity. Factors that may increase risk of adverse event in presence of aspiration Rubye Oaks & Clearance Coots 2021): Factors that may increase risk of adverse event in presence of aspiration Rubye Oaks & Clearance Coots 2021): Respiratory or GI disease Recommendations/Plan: Swallowing Evaluation Recommendations Swallowing Evaluation Recommendations Recommendations: PO diet PO Diet Recommendation: Regular; Thin liquids (Level 0) Liquid Administration via: Cup; Straw Medication Administration: Whole meds with liquid Supervision: Patient able to self-feed Swallowing strategies  : Slow rate; Small bites/sips; Follow solids with liquids Postural changes: Position pt fully upright for meals; Stay upright 30-60 min after meals Oral care recommendations: Oral care BID (2x/day) Recommended consults: Consider GI consultation (could consider on an OP basis given reported symptoms) Treatment Plan Treatment Plan Treatment recommendations: Therapy as outlined in treatment plan below Follow-up recommendations: No SLP follow up Functional status assessment: Patient has had a recent decline in their functional status and demonstrates the ability to make significant improvements in function in a reasonable and predictable  amount of time. Treatment frequency: Min 1x/week Treatment duration: 1 week Interventions: Compensatory techniques; Patient/family education Recommendations Recommendations for follow up therapy are one component of a multi-disciplinary discharge planning process, led by the attending physician.  Recommendations may be updated based on patient status, additional functional criteria and insurance authorization. Assessment: Orofacial Exam: Orofacial Exam Oral Cavity - Dentition: Edentulous; Dentures, not available (patient reports his dentures do not fit properly so he does not wear them) Anatomy: Anatomy: Other (Comment) (presence of spinal hardware (anterior, below cervical spine)) Boluses Administered: Boluses Administered  Boluses Administered: Thin liquids (Level 0); Mildly thick liquids (Level 2, nectar thick); Moderately thick liquids (Level 3, honey thick); Puree; Solid  Oral Impairment Domain: Oral Impairment Domain Lip Closure: No labial escape Tongue control during bolus hold: Cohesive bolus between tongue to palatal seal Oral residue: Trace residue lining oral structures Location of oral residue : Floor of mouth Initiation of pharyngeal swallow : Posterior laryngeal surface of the epiglottis  Pharyngeal Impairment Domain: Pharyngeal Impairment Domain Soft palate elevation: No bolus between soft palate (SP)/pharyngeal wall (PW) Laryngeal elevation: Complete superior movement of thyroid cartilage with complete approximation of arytenoids to epiglottic petiole Anterior hyoid excursion: Complete anterior movement Epiglottic movement: Complete inversion Laryngeal vestibule closure: Complete, no air/contrast in laryngeal vestibule Pharyngeal stripping wave : Present - complete Pharyngeal contraction (A/P view only): N/A Pharyngoesophageal segment opening: Complete distension and complete duration, no obstruction of flow Tongue base retraction: No contrast between tongue base and posterior pharyngeal wall (PPW) Pharyngeal residue: Complete pharyngeal clearance Location of pharyngeal residue: N/A  Esophageal Impairment Domain: Esophageal Impairment Domain Esophageal clearance upright position: Complete clearance, esophageal coating Pill: Pill Consistency administered: Thin liquids (Level 0) Thin liquids (Level 0): Impaired (see clinical impressions) Penetration/Aspiration Scale Score: Penetration/Aspiration Scale Score 1.  Material does not enter airway: Mildly thick liquids (Level 2, nectar thick); Moderately thick liquids (Level 3, honey thick); Puree; Solid; Pill 2.  Material enters airway, remains ABOVE vocal cords then ejected out: Thin liquids (Level 0) Compensatory Strategies: No data recorded  General Information: Caregiver  present: No  Diet Prior to this Study: Full liquid diet; Thin liquids (Level 0)   Temperature : Normal   Respiratory Status: WFL   Supplemental O2: Nasal cannula   History of Recent Intubation: No  Behavior/Cognition: Alert; Cooperative; Pleasant mood Self-Feeding Abilities: Able to self-feed Baseline vocal quality/speech: Normal No data recorded Volitional Swallow: Able to elicit Exam Limitations: No limitations Goal Planning: Prognosis for improved oropharyngeal function: Good No data recorded No data recorded Patient/Family Stated Goal: wants solid food Consulted and agree with results and recommendations: Patient Pain: Pain Assessment Pain Assessment: No/denies pain Faces Pain Scale: 0 End of Session: Start Time:SLP Start Time (ACUTE ONLY): 0757 Stop Time: SLP Stop Time (ACUTE ONLY): 0813 Time Calculation:SLP Time Calculation (min) (ACUTE ONLY): 16 min Charges: SLP Evaluations $ SLP Speech Visit: 1 Visit SLP Evaluations $MBS Swallow: 1 Procedure SLP visit diagnosis: SLP Visit Diagnosis: Dysphagia, unspecified (R13.10) Past Medical History: Past Medical History: Diagnosis Date  Acute edema of lung, unspecified   Acute, but ill-defined, cerebrovascular disease   Allergy   Anemia   Anemia in chronic kidney disease(285.21)   Anxiety   Asthma   Asthma   moderate persistent  Carpal tunnel syndrome   Cellulitis and abscess of trunk   Cholelithiasis 07/13/2014  Chronic headaches   Cigarette smoker 07/11/2017  Stopped 2/022  - referred to start Lung cancer screening 04/2021        Debility,  unspecified   Dermatophytosis of the body   Dysrhythmia   history of  Edema   End stage renal disease on dialysis [facility])   "MWF; [facility] in [facility]" (10/21/2014)  Essential hypertension, benign   Generalized anxiety disorder 05/25/2022  GERD (gastroesophageal reflux disease)   Gout, unspecified   HTN (hypertension)   Hypertrophy of prostate without urinary obstruction and other lower urinary tract symptoms (LUTS)    Hypotension, unspecified   Impotence of organic origin   Insomnia, unspecified   Kidney replaced by transplant   Localization-related (focal) (partial) epilepsy and epileptic syndromes with complex partial seizures, without mention of intractable epilepsy   12-15-19- Wife states he has NEVER had a seizure   Lumbago   Memory loss   OSA on CPAP   Other and unspecified hyperlipidemia   controlled /managed per wife   Other chronic nonalcoholic liver disease   Other malaise and fatigue   Other nonspecific abnormal serum enzyme levels   Pain in joint, lower leg   Pain in joint, upper arm   Pneumonia "several times"  PONV (postoperative nausea and vomiting)   Renal dialysis status(V45.11) 02/05/2010  restarted 01/02/13 ofter renal trransplant failure  Secondary hyperparathyroidism (of renal origin)   Shortness of breath   Sleep apnea   wears cpap   Tension headache   Unspecified constipation   Unspecified essential hypertension   Unspecified hereditary and idiopathic peripheral neuropathy   Unspecified vitamin D deficiency  Past Surgical History: Past Surgical History: Procedure Laterality Date  AV FISTULA PLACEMENT Left ?2010  "forearm; at [facility]"  BACK SURGERY    CARDIAC CATHETERIZATION  03/21/2011  CHOLECYSTECTOMY N/A 10/21/2014  Procedure: LAPAROSCOPIC CHOLECYSTECTOMY WITH INTRAOPERATIVE CHOLANGIOGRAM;  Surgeon: Chevis Pretty III, MD;  Location: [facility] OR;  Service: General;  Laterality: N/A;  COLONOSCOPY    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  IR ANGIO INTRA EXTRACRAN SEL INTERNAL CAROTID UNI R MOD SED  11/30/2021  IR ANGIOGRAM FOLLOW UP STUDY  11/30/2021  IR AV DIALY SHUNT INTRO NEEDLE/INTRACATH INITIAL W/PTA/IMG LEFT  12/13/2022  IR BONE MARROW BIOPSY & ASPIRATION  03/27/2022  IR NEURO EACH ADD'L AFTER BASIC UNI RIGHT (MS)  11/30/2021  IR TRANSCATH/EMBOLIZ  11/30/2021  IR US GUIDE VASC ACCESS LEFT  12/13/2022  KIDNEY TRANSPLANT  08/17/2011  [facility]   LAPAROSCOPIC CHOLECYSTECTOMY  10/21/2014  w/IOC   LEFT HEART CATHETERIZATION WITH CORONARY ANGIOGRAM N/A 03/21/2011  Procedure: LEFT HEART CATHETERIZATION WITH CORONARY ANGIOGRAM;  Surgeon: Chrystie Nose, MD;  Location: [facility] CATH LAB;  Service: Cardiovascular;  Laterality: N/A;  NEPHRECTOMY  08/2013  removed transplaned kidney  POLYPECTOMY    POSTERIOR FUSION CERVICAL SPINE  06/25/2012  for spinal stenosis  RADIOLOGY WITH ANESTHESIA N/A 11/30/2021  Procedure: RIGHT MIDDLE MENINGEAL ARTERY EMBOLIZATION;  Surgeon: Radiologist, Medication, MD;  Location: [facility] OR;  Service: Radiology;  Laterality: N/A;  VASECTOMY  2010 Mahala Menghini., M.A. CCC-SLP Acute Rehabilitation Services Office [phone] Secure chat preferred 03/20/2023, 11:38 AM  DG Chest 2 View Result Date: 03/18/2023 CLINICAL DATA:  Acute hypoxic respiratory failure. EXAM: CHEST - 2 VIEW COMPARISON:  03/16/2023 FINDINGS: Cardiac enlargement. Mild central pulmonary vascular congestion. No edema or consolidation. No pleural effusion or pneumothorax. Mediastinal contours appear intact. Calcification of the aorta. Postoperative changes in the cervical spine. IMPRESSION: Cardiac enlargement with mild central vascular congestion, similar to prior study. No developing edema or consolidation. Electronically Signed   By: Burman Nieves M.D.   On: 03/18/2023 18:08   ECHOCARDIOGRAM  COMPLETE Result Date: 03/18/2023    ECHOCARDIOGRAM REPORT   Patient Name:   Kirk [name] Date of Exam: 03/18/2023 Medical Rec #:  [id number]       Height:       67.0 in Accession #:    [id number]      Weight:       184.4 lb Date of Birth:  [birth year]        BSA:          1.954 m Patient Age:    63 years        BP:           135/65 mmHg Patient Gender: M               HR:           65 bpm. Exam Location:  Inpatient Procedure: 2D Echo, Cardiac Doppler and Color Doppler (Both Spectral and Color            Flow Doppler were utilized during procedure). Indications:    Dyspnea  History:        Patient has prior history of Echocardiogram examinations,  most                 recent 01/09/2023. CHF, Stroke, Aortic Valve Disease,                 Signs/Symptoms:Edema; Risk Factors:Hypertension.  Sonographer:    Amy Chionchio Referring Phys: [id number] Elenor Quinones DAVIS IMPRESSIONS  1. Left ventricular ejection fraction, by estimation, is 45 to 50%. Left ventricular ejection fraction by 2D MOD biplane is 49.7 %. The left ventricle has mildly decreased function. The left ventricle demonstrates global hypokinesis. The left ventricular internal cavity size was mildly dilated. Left ventricular diastolic parameters are consistent with Grade II diastolic dysfunction (pseudonormalization).  2. Right ventricular systolic function is normal. The right ventricular size is normal. There is moderately elevated pulmonary artery systolic pressure. The estimated right ventricular systolic pressure is 59.6 mmHg.  3. Left atrial size was moderately dilated.  4. Right atrial size was mildly dilated.  5. The mitral valve is degenerative. No evidence of mitral valve regurgitation. No evidence of mitral stenosis. Moderate mitral annular calcification.  6. The aortic valve was not well visualized. There is severe calcifcation of the aortic valve. Aortic valve regurgitation is not visualized. Mild to moderate aortic valve stenosis. Aortic valve area, by VTI measures 1.23 cm. Aortic valve mean gradient measures 18.0 mmHg.  7. The inferior vena cava is dilated in size with >50% respiratory variability, suggesting right atrial pressure of 8 mmHg. FINDINGS  Left Ventricle: Left ventricular ejection fraction, by estimation, is 45 to 50%. Left ventricular ejection fraction by 2D MOD biplane is 49.7 %. The left ventricle has mildly decreased function. The left ventricle demonstrates global hypokinesis. Strain  imaging was not performed. The left ventricular internal cavity size was mildly dilated. There is no left ventricular hypertrophy. Left ventricular diastolic parameters are consistent with Grade  II diastolic dysfunction (pseudonormalization). Right Ventricle: The right ventricular size is normal. No increase in right ventricular wall thickness. Right ventricular systolic function is normal. There is moderately elevated pulmonary artery systolic pressure. The tricuspid regurgitant velocity is 3.59 m/s, and with an assumed right atrial pressure of 8 mmHg, the estimated right ventricular systolic pressure is 59.6 mmHg. Left Atrium: Left atrial size was moderately dilated. Right Atrium: Right atrial size was mildly dilated. Pericardium: There is no evidence of pericardial effusion. Mitral Valve: The  mitral valve is degenerative in appearance. Moderate mitral annular calcification. No evidence of mitral valve regurgitation. No evidence of mitral valve stenosis. MV peak gradient, 9.5 mmHg. The mean mitral valve gradient is 3.0 mmHg. Tricuspid Valve: The tricuspid valve is normal in structure. Tricuspid valve regurgitation is mild. Aortic Valve: The aortic valve was not well visualized. There is severe calcifcation of the aortic valve. Aortic valve regurgitation is not visualized. Mild to moderate aortic stenosis is present. Aortic valve mean gradient measures 18.0 mmHg. Aortic valve peak gradient measures 32.0 mmHg. Aortic valve area, by VTI measures 1.23 cm. Pulmonic Valve: The pulmonic valve was normal in structure. Pulmonic valve regurgitation is not visualized. Aorta: The aortic root is normal in size and structure. Venous: The inferior vena cava is dilated in size with greater than 50% respiratory variability, suggesting right atrial pressure of 8 mmHg. IAS/Shunts: No atrial level shunt detected by color flow Doppler. Additional Comments: 3D imaging was not performed.  LEFT VENTRICLE PLAX 2D                        Biplane EF (MOD) LVIDd:         6.20 cm         LV Biplane EF:   Left LVIDs:         5.30 cm                          ventricular LV PW:         0.90 cm                          ejection LV IVS:         0.80 cm                          fraction by LVOT diam:     2.10 cm                          2D MOD LV SV:         75                               biplane is LV SV Index:   38                               49.7 %. LVOT Area:     3.46 cm                                Diastology                                LV e' medial:    5.00 cm/s LV Volumes (MOD)               LV E/e' medial:  26.4 LV vol d, MOD    188.0 ml      LV e' lateral:   6.42 cm/s A2C:  LV E/e' lateral: 20.6 LV vol d, MOD    170.0 ml A4C: LV vol s, MOD    94.3 ml A2C: LV vol s, MOD    80.0 ml A4C: LV SV MOD A2C:   93.7 ml LV SV MOD A4C:   170.0 ml LV SV MOD BP:    90.5 ml RIGHT VENTRICLE             IVC RV Basal diam:  3.80 cm     IVC diam: 2.40 cm RV S prime:     10.70 cm/s TAPSE (M-mode): 2.1 cm LEFT ATRIUM              Index        RIGHT ATRIUM           Index LA Vol (A2C):   89.5 ml  45.81 ml/m  RA Area:     23.80 cm LA Vol (A4C):   117.0 ml 59.89 ml/m  RA Volume:   80.80 ml  41.36 ml/m LA Biplane Vol: 110.0 ml 56.31 ml/m  AORTIC VALVE AV Area (Vmax):    1.31 cm AV Area (Vmean):   1.17 cm AV Area (VTI):     1.23 cm AV Vmax:           283.00 cm/s AV Vmean:          203.000 cm/s AV VTI:            0.608 m AV Peak Grad:      32.0 mmHg AV Mean Grad:      18.0 mmHg LVOT Vmax:         107.00 cm/s LVOT Vmean:        68.700 cm/s LVOT VTI:          0.216 m LVOT/AV VTI ratio: 0.36  AORTA Ao Root diam: 3.10 cm Ao Asc diam:  3.40 cm MITRAL VALVE                TRICUSPID VALVE MV Area (PHT): 3.03 cm     TR Peak grad:   51.6 mmHg MV Area VTI:   1.45 cm     TR Vmax:        359.00 cm/s MV Peak grad:  9.5 mmHg MV Mean grad:  3.0 mmHg     SHUNTS MV Vmax:       1.54 m/s     Systemic VTI:  0.22 m MV Vmean:      84.2 cm/s    Systemic Diam: 2.10 cm MV Decel Time: 250 msec MV E velocity: 132.00 cm/s MV A velocity: 102.00 cm/s MV E/A ratio:  1.29 Dalton McleanMD Electronically signed by Wilfred Lacy Signature Date/Time:  03/18/2023/12:50:09 PM    Final    CT Angio Chest Pulmonary Embolism (PE) W or WO Contrast Result Date: 03/16/2023 CLINICAL DATA:  Chest pain, shortness of breath EXAM: CT ANGIOGRAPHY CHEST WITH CONTRAST TECHNIQUE: Multidetector CT imaging of the chest was performed using the standard protocol during bolus administration of intravenous contrast. Multiplanar CT image reconstructions and MIPs were obtained to evaluate the vascular anatomy. RADIATION DOSE REDUCTION: This exam was performed according to the departmental dose-optimization program which includes automated exposure control, adjustment of the mA and/or kV according to patient size and/or use of iterative reconstruction technique. CONTRAST:  75mL OMNIPAQUE IOHEXOL 350 MG/ML SOLN COMPARISON:  X-ray 03/16/2023, CT 01/08/2023 FINDINGS: Cardiovascular: Satisfactory opacification of the pulmonary arteries to the segmental level. No evidence of pulmonary embolism. Thoracic aorta is nonaneurysmal.  Atherosclerotic calcifications of the aorta and coronary arteries. Heart size is upper limits of normal. No pericardial effusion. Mediastinum/Nodes: Multiple mildly enlarged mediastinal and right hilar lymph nodes including 11 mm right paratracheal (series 5, image 37), 14 mm precarinal (series 5, image 56), lower right hilar node measuring 14 mm (series 5, image 63). No significant change in the degree of lymphadenopathy compared to the prior study. No axillary lymphadenopathy. Thyroid gland, trachea, and esophagus within normal limits. Lungs/Pleura: Bronchial wall thickening with multifocal tree-in-bud opacities most pronounced within the bilateral lower lobes. Trace right pleural effusion. No pneumothorax. Upper Abdomen: No acute abnormality. Musculoskeletal: No chest wall abnormality. No acute or significant osseous findings. Review of the MIP images confirms the above findings. IMPRESSION: 1. No evidence of pulmonary embolism. 2. Bronchial wall thickening with  multifocal tree-in-bud opacities most pronounced within the bilateral lower lobes, suggestive of an infectious or inflammatory bronchiolitis. 3. Trace right pleural effusion. 4. Multiple mildly enlarged mediastinal and right hilar lymph nodes, not significantly changed from the prior study. Findings are nonspecific and may be reactive. 5. Aortic and coronary artery atherosclerosis (ICD10-I70.0). Electronically Signed   By: Duanne Guess D.O.   On: 03/16/2023 20:45   DG Chest 2 View Result Date: 03/16/2023 CLINICAL DATA:  Shortness of breath, chest pain. EXAM: CHEST - 2 VIEW COMPARISON:  February 28, 2023. FINDINGS: Stable cardiomegaly with mild central pulmonary vascular congestion. Minimal bibasilar densities are noted which may represent edema or scarring. Bony thorax is unremarkable. IMPRESSION: Stable cardiomegaly with mild central pulmonary vascular congestion. Minimal bibasilar edema or scarring is noted. Electronically Signed   By: Lupita Raider M.D.   On: 03/16/2023 13:19   CT Head Wo Contrast Result Date: 03/16/2023 CLINICAL DATA:  Headache, increasing frequency or severity EXAM: CT HEAD WITHOUT CONTRAST TECHNIQUE: Contiguous axial images were obtained from the base of the skull through the vertex without intravenous contrast. RADIATION DOSE REDUCTION: This exam was performed according to the departmental dose-optimization program which includes automated exposure control, adjustment of the mA and/or kV according to patient size and/or use of iterative reconstruction technique. COMPARISON:  CT head 01/09/2023. FINDINGS: Brain: No evidence of acute infarction, hemorrhage, hydrocephalus, extra-axial collection or mass lesion/mass effect. Vascular: No hyperdense vessel. Skull: No acute fracture. Sinuses/Orbits: Clear sinuses.  No acute orbital findings. Other: No mastoid effusions. IMPRESSION: No evidence of acute intracranial abnormality. Electronically Signed   By: Feliberto Harts M.D.   On:  03/16/2023 12:59   NM PET CT CARDIAC PERFUSION MULTI W/ABSOLUTE BLOODFLOW Result Date: 03/05/2023   Normal perfusion images.  Myocardial blood flow reserve is decreased globally and in each coronary distribution, suggestive of severe multivessel CAD vs microvascular disease (though flow reserve can be inaccurate in setting of ESRD).  LVEF is moderately reduced (35% at rest, 37% at stress).  Severe coronary calcifications on CT.  No high risk findings such as TID or drop in EF with stress.  Overall, study is intermediate risk   LV perfusion is normal. There is no evidence of ischemia. There is no evidence of infarction.   Rest left ventricular function is abnormal. Rest global function is moderately reduced. Rest EF: 35%. Stress left ventricular function is abnormal. Stress global function is moderately reduced. Stress EF: 37%. End diastolic cavity size is moderately enlarged. End systolic cavity size is moderately enlarged.   Myocardial blood flow was computed to be 0.40ml/g/min at rest and 1.40ml/g/min at stress. Global myocardial blood flow reserve was 1.67 and was abnormal.  Coronary calcium was present on the attenuation correction CT images. Severe coronary calcifications were present. Coronary calcifications were present in the left anterior descending artery, left circumflex artery and right coronary artery distribution(s).   The study is intermediate risk.   Electronically signed by Epifanio Lesches, MD CLINICAL DATA:  This over-read does not include interpretation of cardiac or coronary anatomy or pathology. The Cardiac PET CT interpretation by the cardiologist is attached. COMPARISON:  CT chest angiogram, 01/08/2023 FINDINGS: Cardiovascular: Aortic atherosclerosis. Dense aortic valve calcifications. Cardiomegaly. Three-vessel coronary artery calcifications. Enlargement of the main pulmonary artery measuring up to 3.9 cm in caliber. No pericardial effusion. Limited Mediastinum/Nodes: No enlarged  mediastinal, hilar, or axillary lymph nodes. Trachea and esophagus demonstrate no significant findings. Limited Lungs/Pleura: Small bilateral pleural effusions. Interlobular septal thickening throughout the lung bases. Mosaic attenuation of the airspaces. Upper Abdomen: No acute abnormality. Musculoskeletal: No chest wall abnormality. No acute osseous findings. IMPRESSION: 1. Small bilateral pleural effusions and interlobular septal thickening throughout the lung bases, consistent with pulmonary edema. 2. Mosaic attenuation of the airspaces, consistent with small airways disease. 3. Cardiomegaly and coronary artery disease. 4. Enlargement of the main pulmonary artery, as can be seen in pulmonary hypertension. 5. Dense aortic valve calcifications. Correlate for echocardiographic evidence of aortic valve dysfunction. Aortic Atherosclerosis (ICD10-I70.0). Electronically Signed   By: Jearld Lesch M.D.   On: 03/05/2023 14:20   Assessment/Plan  Chronic Kidney Disease with Hemodialysis Undergoing regular hemodialysis with a functioning fistulogram. Lab work from March 1st shows well-managed hemoglobin levels, indicating improvement in anemia. Concerns about potential bleeding due to weight loss and hypotension. Stool sample results were inconclusive due to collection error. Recent swallow study performed in the hospital. - Continue regular hemodialysis sessions - Monitor hemoglobin levels regularly - Ensure proper stool sample collection for occult blood testing - Follow up with gastroenterologist for evaluation of potential bleeding and diarrhea - Continue Protonix for gastrointestinal protection  Hypertension Blood pressure is well-controlled at 136/80 mmHg. Recent medication adjustments include reduced dosages of hydralazine and metoprolol, while amlodipine and doxazosin remain unchanged. On aspirin therapy. - Continue current antihypertensive regimen: amlodipine 10 mg daily, hydralazine 50 mg three  times daily, metoprolol 50 mg twice daily, doxazosin 4 mg at bedtime - Monitor blood pressure regularly - Continue aspirin therapy  Arterial Fibrillation  HR controlled  - Not on anticoagulant  - continue on Metoprolol  - Follow up with Cardiologist today as scheduled.   Chronic Systolic Heart Failure  No signs of fluid overload. Has Scheduled cardiology appointment today. Confusion regarding the need for a heart catheterization during a recent hospital stay, which was not performed. Denies recent chest pain. - Follow up with cardiologist to discuss potential need for heart catheterization and further cardiac evaluation  Chronic Obstructive Pulmonary Disease (COPD) On multiple inhalers including Ellipta, Incruse, Breo, and albuterol for COPD management. Also taking Mucinex as needed to ensure clear airways. - Continue current inhaler regimen: Ellipta, Incruse, Breo, and albuterol as needed - Continue Mucinex 1200 mg twice daily as needed  Generalized anxiety /major depression  Mood stable  - continue on Duloxetine    General Health Maintenance Up to date on most immunizations but missing shingles and COVID vaccines. - Administer shingles and COVID vaccines at [facility] if not done during dialysis   Family/ staff Communication: Reviewed plan of care with patient verbalized understanding   Labs/tests ordered: None   Next Appointment : Return in about 6 months (around 10/03/2023) for medical mangement of chronic  issues.Marland Kitchen   Spent 30 minutes of Face to face and non-face to face with patient  >50% time spent counseling; reviewing medical record; tests; labs; documentation and developing future plan of care.   Caesar Bookman, NP

## 2023-04-02 NOTE — H&P (View-Only) (Signed)
 Cardiology Office Note:  .   Date:  04/02/2023  ID:  Kirk [name], DOB September 14, 1960, MRN [id number] PCP: Kirk Bookman, NP  [facility] Providers Cardiologist:  Chrystie Nose, MD    History of Present Illness: .   Kirk [name] is a 63 y.o. 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   Previous cardiac catheterization 2016 with no significant obstructive coronary disease.  He had chest pain at the time associated with dialysis.  Echocardiogram August 2018 mild LVH, LVEF 55 to 60%.  CT angiography performed January 2020 for sharp chest pain with no PE though did show bronchitis and small airway bronchiolitis.  He was treated with antibiotics.  He did have atrial fibrillation at the time though given CHA2DS2-VASc score of 1 he was not recommended for anticoagulation.  Echocardiogram 01/2018 LVEF 55% with no regional wall motion abnormalities.    Admitted 10/17-10/31/23 with new onset seizure at home 2 minutes. Keppra transitioned to Depakote but was transitioned back to keppra. 11/14/21 developed right sided gaze and left neglect with CT head no stroke, not candidate for TPA. Felt to be related to seizure activity and resolved. MRI 11/16/21 suggested rebleeding, no surgical intervention recommended. He did have PAF 11/15/21 which converted with dose of Cardizem (had not had Metoprolol in 36h due to mental status) and converted to NSR within one hour. Discharged to inpatient rehab. He was readmitted and required right middle meningeal artery embolization 11/30/21.    Blood pressure has been difficult to control.  Isosorbide previously increased.  Imdur had to be reduced due to headache.  Doxazosin increased.  Losartan transitioned to valsartan.  Seen by Dr. Rennis Golden 01/01/2023 with progressive dyspnea, chest pain, shortness of breath.  Chest pain was different than the  chest pain he had complained of previously related to dialysis.  Cardiac PET and echo ordered.  Admitted 12/17 - 01/11/2024 with acute on hypoxic respiratory failure in the setting of pneumonia.  Cardiac PET 03/05/2023 with myocardial blood flow decreased globally and in each coronary distribution suggestive of severe multivessel CAD versus microvascular disease.  LVEF 35%.  Severe coronary calcifications present on CT images.    Echocardiogram 03/18/2023 LVEF 45-50%, RV SF normal, moderately elevated PASP, aortic valve not well-visualized with mild to moderate stenosis mean gradient 18 mmHg.  Admitted 2/22 - 03/23/2023 with acute hypoxic respiratory failure in the setting of flu, asthma exacerbation.  Home BP regimen reduced due to orthostasis.  Required fosfomycin for UTI symptoms.  Commended for acute inpatient rehab discharge but preferred discharge with home health PT.  On 03/28/23 underwent angioplasty of significant 80% stenosis in outflow cephalic vein site.   Based on cardiac PET recommended for definitive cardiac cath to evaluate by Dr. Rennis Golden.  Here to discuss today with his wife. We reviewed cardiac PET scan. Reports intermittent chest pain at rest with sudden onset described as sharp, aching, and pressure. Describes as most often being a  "lightning" like feeling. Reports breathing has improved on new inhalers. Reports BP at dialysis has been well controlled. Enjoying his new dialysis center and has sessions M, W, F afternoons.   ROS: Please see the history of present illness.    All other systems reviewed and are negative.   Studies Reviewed: Marland Kitchen   EKG Interpretation Date/Time:  Tuesday April 02 2023 13:53:48 EDT Ventricular Rate:  65 PR Interval:  186 QRS Duration:  102 QT Interval:  440 QTC Calculation: 457 R Axis:   64  Text Interpretation: Normal sinus rhythm Minimal voltage criteria for LVH, may be normal variant ( Sokolow-Lyon )  No acute ST/T wave changes. Confirmed by Kirk [name] ([id number]) on 04/02/2023 1:57:19 PM    Cardiac Studies & Procedures   ______________________________________________________________________________________________   STRESS TESTS  NM PET CT CARDIAC PERFUSION MULTI W/ABSOLUTE BLOODFLOW 03/05/2023  Narrative   Normal perfusion images.  Myocardial blood flow reserve is decreased globally and in each coronary distribution, suggestive of severe multivessel CAD vs microvascular disease (though flow reserve can be inaccurate in setting of ESRD).  LVEF is moderately reduced (35% at rest, 37% at stress).  Severe coronary calcifications on CT.  No high risk findings such as TID or drop in EF with stress.  Overall, study is intermediate risk   LV perfusion is normal. There is no evidence of ischemia. There is no evidence of infarction.   Rest left ventricular function is abnormal. Rest global function is moderately reduced. Rest EF: 35%. Stress left ventricular function is abnormal. Stress global function is moderately reduced. Stress EF: 37%. End diastolic cavity size is moderately enlarged. End systolic cavity size is moderately enlarged.   Myocardial blood flow was computed to be 0.40ml/g/min at rest and 1.22ml/g/min at stress. Global myocardial blood flow reserve was 1.67 and was abnormal.   Coronary calcium was present on the attenuation correction CT images. Severe coronary calcifications were present. Coronary calcifications were present in the left anterior descending artery, left circumflex artery and right coronary artery distribution(s).   The study is intermediate risk.   Electronically signed by Epifanio Lesches, MD  CLINICAL DATA:  This over-read does not include interpretation of cardiac or coronary anatomy or pathology. The Cardiac PET CT interpretation by the cardiologist is attached.  COMPARISON:  CT chest angiogram, 01/08/2023  FINDINGS: Cardiovascular: Aortic atherosclerosis. Dense aortic valve calcifications.  Cardiomegaly. Three-vessel coronary artery calcifications. Enlargement of the main pulmonary artery measuring up to 3.9 cm in caliber. No pericardial effusion.  Limited Mediastinum/Nodes: No enlarged mediastinal, hilar, or axillary lymph nodes. Trachea and esophagus demonstrate no significant findings.  Limited Lungs/Pleura: Small bilateral pleural effusions. Interlobular septal thickening throughout the lung bases. Mosaic attenuation of the airspaces.  Upper Abdomen: No acute abnormality.  Musculoskeletal: No chest wall abnormality. No acute osseous findings.  IMPRESSION: 1. Small bilateral pleural effusions and interlobular septal thickening throughout the lung bases, consistent with pulmonary edema. 2. Mosaic attenuation of the airspaces, consistent with small airways disease. 3. Cardiomegaly and coronary artery disease. 4. Enlargement of the main pulmonary artery, as can be seen in pulmonary hypertension. 5. Dense aortic valve calcifications. Correlate for echocardiographic evidence of aortic valve dysfunction.  Aortic Atherosclerosis (ICD10-I70.0).   Electronically Signed By: Jearld Lesch M.D. On: 03/05/2023 14:20   ECHOCARDIOGRAM  ECHOCARDIOGRAM COMPLETE 03/18/2023  Narrative ECHOCARDIOGRAM REPORT    Patient Name:   REGGINALD PASK Date of Exam: 03/18/2023 Medical Rec #:  [id number]       Height:       67.0 in Accession #:    [id number]      Weight:       184.4 lb Date of Birth:  Jan 21, 1961        BSA:          1.954 m Patient Age:    63 years        BP:  135/65 mmHg Patient Gender: M               HR:           65 bpm. Exam Location:  Inpatient  Procedure: 2D Echo, Cardiac Doppler and Color Doppler (Both Spectral and Color Flow Doppler were utilized during procedure).  Indications:    Dyspnea  History:        Patient has prior history of Echocardiogram examinations, most recent 01/09/2023. CHF, Stroke, Aortic Valve  Disease, Signs/Symptoms:Edema; Risk Factors:Hypertension.  Sonographer:    Amy Chionchio Referring Phys: [id number] Elenor Quinones DAVIS  IMPRESSIONS   1. Left ventricular ejection fraction, by estimation, is 45 to 50%. Left ventricular ejection fraction by 2D MOD biplane is 49.7 %. The left ventricle has mildly decreased function. The left ventricle demonstrates global hypokinesis. The left ventricular internal cavity size was mildly dilated. Left ventricular diastolic parameters are consistent with Grade II diastolic dysfunction (pseudonormalization). 2. Right ventricular systolic function is normal. The right ventricular size is normal. There is moderately elevated pulmonary artery systolic pressure. The estimated right ventricular systolic pressure is 59.6 mmHg. 3. Left atrial size was moderately dilated. 4. Right atrial size was mildly dilated. 5. The mitral valve is degenerative. No evidence of mitral valve regurgitation. No evidence of mitral stenosis. Moderate mitral annular calcification. 6. The aortic valve was not well visualized. There is severe calcifcation of the aortic valve. Aortic valve regurgitation is not visualized. Mild to moderate aortic valve stenosis. Aortic valve area, by VTI measures 1.23 cm. Aortic valve mean gradient measures 18.0 mmHg. 7. The inferior vena cava is dilated in size with >50% respiratory variability, suggesting right atrial pressure of 8 mmHg.  FINDINGS Left Ventricle: Left ventricular ejection fraction, by estimation, is 45 to 50%. Left ventricular ejection fraction by 2D MOD biplane is 49.7 %. The left ventricle has mildly decreased function. The left ventricle demonstrates global hypokinesis. Strain imaging was not performed. The left ventricular internal cavity size was mildly dilated. There is no left ventricular hypertrophy. Left ventricular diastolic parameters are consistent with Grade II diastolic dysfunction (pseudonormalization).  Right  Ventricle: The right ventricular size is normal. No increase in right ventricular wall thickness. Right ventricular systolic function is normal. There is moderately elevated pulmonary artery systolic pressure. The tricuspid regurgitant velocity is 3.59 m/s, and with an assumed right atrial pressure of 8 mmHg, the estimated right ventricular systolic pressure is 59.6 mmHg.  Left Atrium: Left atrial size was moderately dilated.  Right Atrium: Right atrial size was mildly dilated.  Pericardium: There is no evidence of pericardial effusion.  Mitral Valve: The mitral valve is degenerative in appearance. Moderate mitral annular calcification. No evidence of mitral valve regurgitation. No evidence of mitral valve stenosis. MV peak gradient, 9.5 mmHg. The mean mitral valve gradient is 3.0 mmHg.  Tricuspid Valve: The tricuspid valve is normal in structure. Tricuspid valve regurgitation is mild.  Aortic Valve: The aortic valve was not well visualized. There is severe calcifcation of the aortic valve. Aortic valve regurgitation is not visualized. Mild to moderate aortic stenosis is present. Aortic valve mean gradient measures 18.0 mmHg. Aortic valve peak gradient measures 32.0 mmHg. Aortic valve area, by VTI measures 1.23 cm.  Pulmonic Valve: The pulmonic valve was normal in structure. Pulmonic valve regurgitation is not visualized.  Aorta: The aortic root is normal in size and structure.  Venous: The inferior vena cava is dilated in size with greater than 50% respiratory variability, suggesting right atrial pressure of 8  mmHg.  IAS/Shunts: No atrial level shunt detected by color flow Doppler.  Additional Comments: 3D imaging was not performed.   LEFT VENTRICLE PLAX 2D                        Biplane EF (MOD) LVIDd:         6.20 cm         LV Biplane EF:   Left LVIDs:         5.30 cm                          ventricular LV PW:         0.90 cm                          ejection LV IVS:        0.80  cm                          fraction by LVOT diam:     2.10 cm                          2D MOD LV SV:         75                               biplane is LV SV Index:   38                               49.7 %. LVOT Area:     3.46 cm Diastology LV e' medial:    5.00 cm/s LV Volumes (MOD)               LV E/e' medial:  26.4 LV vol d, MOD    188.0 ml      LV e' lateral:   6.42 cm/s A2C:                           LV E/e' lateral: 20.6 LV vol d, MOD    170.0 ml A4C: LV vol s, MOD    94.3 ml A2C: LV vol s, MOD    80.0 ml A4C: LV SV MOD A2C:   93.7 ml LV SV MOD A4C:   170.0 ml LV SV MOD BP:    90.5 ml  RIGHT VENTRICLE             IVC RV Basal diam:  3.80 cm     IVC diam: 2.40 cm RV S prime:     10.70 cm/s TAPSE (M-mode): 2.1 cm  LEFT ATRIUM              Index        RIGHT ATRIUM           Index LA Vol (A2C):   89.5 ml  45.81 ml/m  RA Area:     23.80 cm LA Vol (A4C):   117.0 ml 59.89 ml/m  RA Volume:   80.80 ml  41.36 ml/m LA Biplane Vol: 110.0 ml 56.31 ml/m AORTIC VALVE AV Area (Vmax):    1.31 cm AV Area (Vmean):   1.17 cm AV Area (VTI):  1.23 cm AV Vmax:           283.00 cm/s AV Vmean:          203.000 cm/s AV VTI:            0.608 m AV Peak Grad:      32.0 mmHg AV Mean Grad:      18.0 mmHg LVOT Vmax:         107.00 cm/s LVOT Vmean:        68.700 cm/s LVOT VTI:          0.216 m LVOT/AV VTI ratio: 0.36  AORTA Ao Root diam: 3.10 cm Ao Asc diam:  3.40 cm  MITRAL VALVE                TRICUSPID VALVE MV Area (PHT): 3.03 cm     TR Peak grad:   51.6 mmHg MV Area VTI:   1.45 cm     TR Vmax:        359.00 cm/s MV Peak grad:  9.5 mmHg MV Mean grad:  3.0 mmHg     SHUNTS MV Vmax:       1.54 m/s     Systemic VTI:  0.22 m MV Vmean:      84.2 cm/s    Systemic Diam: 2.10 cm MV Decel Time: 250 msec MV E velocity: 132.00 cm/s MV A velocity: 102.00 cm/s MV E/A ratio:  1.29  Dalton McleanMD Electronically signed by Wilfred Lacy Signature Date/Time:  03/18/2023/12:50:09 PM    Final          ______________________________________________________________________________________________      Risk Assessment/Calculations:    CHA2DS2-VASc Score = 3   This indicates a 3.2% annual risk of stroke. The patient's score is based upon: CHF History: 1 HTN History: 1 Diabetes History: 0 Stroke History: 0 Vascular Disease History: 1 Age Score: 0 Gender Score: 0            Physical Exam:   VS:  BP 130/60 (BP Location: Right Arm, Patient Position: Sitting, Cuff Size: Normal)   Pulse 64   Ht 5\' 7"  (1.702 m)   Wt 190 lb 6.4 oz (86.4 kg)   SpO2 97%   BMI 29.82 kg/m    Wt Readings from Last 3 Encounters:  04/02/23 190 lb 6.4 oz (86.4 kg)  04/02/23 189 lb 9.6 oz (86 kg)  03/23/23 184 lb 11.9 oz (83.8 kg)    GEN: Well nourished, well developed in no acute distress NECK: No JVD; No carotid bruits CARDIAC: RRR, no murmurs, rubs, gallops RESPIRATORY:  Clear to auscultation without rales, wheezing or rhonchi  ABDOMEN: Soft, non-tender, non-distended EXTREMITIES:  No edema; No deformity   ASSESSMENT AND PLAN: .    Chest pain/coronary artery disease/HLD, LDL goal less than 70- Prior LHC 2016 with no significant obstructive coronary artery disease. Cardiac PET 03/06/23 with severe coronary calcifications, myocardial blood flow reverse decreased globally suggestive of severe multivessel cAD vs microvascular disease. Given progressive decline in EF, chest pain, exertional dyspnea plan for LHC. Today reports continued episodes of "lightning" chest pain though dyspnea improved with inhaler changes.  GDMT includes aspirin 81mg  daily, lopressor 50mg  BID, pravastatin 40mg  daily.  Will discuss with Dr. Rennis Golden timing of LHC - non HD day then HD at his usual dialysis center next day versus attempt to dialyze inpatient  Systolic heart failure - cardiac PET 03/05/23 LVEF 35-37%. Volume managed with HD. GDMT includes Hydralazine 50mg  TID, Imdur 60mg   daily, Lopressor  50mg  BID. Recommend low sodium diet <2g and <2L fluid intake per day.   HTN- BP well controlled. Continue current antihypertensive regimen amlodipine 10 mg daily, doxazosin 4 mg nightly, hydralazine 50 mg 3 times daily, Imdur 60 mg daily, Lopressor 50 mg twice daily.  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  PAF - Episodes have been brief, in setting of acute illness, and self limiting. Will defer OAC as episodes have been self limited.   COPD / Anxiety / Depression - Continue to follow with PCP.     Informed Consent   Shared Decision Making/Informed Consent The risks [stroke (1 in 1000), death (1 in 1000), kidney failure [usually temporary] (1 in 500), bleeding (1 in 200), allergic reaction [possibly serious] (1 in 200)], benefits (diagnostic support and management of coronary artery disease) and alternatives of a cardiac catheterization were discussed in detail with Mr. Shean and he is willing to proceed.     Dispo: follow up 2-3 weeks after LHC  Signed, Alver Sorrow, NP

## 2023-04-02 NOTE — Progress Notes (Signed)
 Cardiology Office Note:  .   Date:  04/02/2023  ID:  Kirk [name], DOB September 14, 1960, MRN [id number] PCP: Kirk Bookman, NP  [facility] Providers Cardiologist:  Chrystie Nose, MD    History of Present Illness: .   Kirk [name] is a 63 y.o. 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   Previous cardiac catheterization 2016 with no significant obstructive coronary disease.  He had chest pain at the time associated with dialysis.  Echocardiogram August 2018 mild LVH, LVEF 55 to 60%.  CT angiography performed January 2020 for sharp chest pain with no PE though did show bronchitis and small airway bronchiolitis.  He was treated with antibiotics.  He did have atrial fibrillation at the time though given CHA2DS2-VASc score of 1 he was not recommended for anticoagulation.  Echocardiogram 01/2018 LVEF 55% with no regional wall motion abnormalities.    Admitted 10/17-10/31/23 with new onset seizure at home 2 minutes. Keppra transitioned to Depakote but was transitioned back to keppra. 11/14/21 developed right sided gaze and left neglect with CT head no stroke, not candidate for TPA. Felt to be related to seizure activity and resolved. MRI 11/16/21 suggested rebleeding, no surgical intervention recommended. He did have PAF 11/15/21 which converted with dose of Cardizem (had not had Metoprolol in 36h due to mental status) and converted to NSR within one hour. Discharged to inpatient rehab. He was readmitted and required right middle meningeal artery embolization 11/30/21.    Blood pressure has been difficult to control.  Isosorbide previously increased.  Imdur had to be reduced due to headache.  Doxazosin increased.  Losartan transitioned to valsartan.  Seen by Dr. Rennis Golden 01/01/2023 with progressive dyspnea, chest pain, shortness of breath.  Chest pain was different than the  chest pain he had complained of previously related to dialysis.  Cardiac PET and echo ordered.  Admitted 12/17 - 01/11/2024 with acute on hypoxic respiratory failure in the setting of pneumonia.  Cardiac PET 03/05/2023 with myocardial blood flow decreased globally and in each coronary distribution suggestive of severe multivessel CAD versus microvascular disease.  LVEF 35%.  Severe coronary calcifications present on CT images.    Echocardiogram 03/18/2023 LVEF 45-50%, RV SF normal, moderately elevated PASP, aortic valve not well-visualized with mild to moderate stenosis mean gradient 18 mmHg.  Admitted 2/22 - 03/23/2023 with acute hypoxic respiratory failure in the setting of flu, asthma exacerbation.  Home BP regimen reduced due to orthostasis.  Required fosfomycin for UTI symptoms.  Commended for acute inpatient rehab discharge but preferred discharge with home health PT.  On 03/28/23 underwent angioplasty of significant 80% stenosis in outflow cephalic vein site.   Based on cardiac PET recommended for definitive cardiac cath to evaluate by Dr. Rennis Golden.  Here to discuss today with his wife. We reviewed cardiac PET scan. Reports intermittent chest pain at rest with sudden onset described as sharp, aching, and pressure. Describes as most often being a  "lightning" like feeling. Reports breathing has improved on new inhalers. Reports BP at dialysis has been well controlled. Enjoying his new dialysis center and has sessions M, W, F afternoons.   ROS: Please see the history of present illness.    All other systems reviewed and are negative.   Studies Reviewed: Marland Kitchen   EKG Interpretation Date/Time:  Tuesday April 02 2023 13:53:48 EDT Ventricular Rate:  65 PR Interval:  186 QRS Duration:  102 QT Interval:  440 QTC Calculation: 457 R Axis:   64  Text Interpretation: Normal sinus rhythm Minimal voltage criteria for LVH, may be normal variant ( Sokolow-Lyon )  No acute ST/T wave changes. Confirmed by Gillian Shields ([id number]) on 04/02/2023 1:57:19 PM    Cardiac Studies & Procedures   ______________________________________________________________________________________________   STRESS TESTS  NM PET CT CARDIAC PERFUSION MULTI W/ABSOLUTE BLOODFLOW 03/05/2023  Narrative   Normal perfusion images.  Myocardial blood flow reserve is decreased globally and in each coronary distribution, suggestive of severe multivessel CAD vs microvascular disease (though flow reserve can be inaccurate in setting of ESRD).  LVEF is moderately reduced (35% at rest, 37% at stress).  Severe coronary calcifications on CT.  No high risk findings such as TID or drop in EF with stress.  Overall, study is intermediate risk   LV perfusion is normal. There is no evidence of ischemia. There is no evidence of infarction.   Rest left ventricular function is abnormal. Rest global function is moderately reduced. Rest EF: 35%. Stress left ventricular function is abnormal. Stress global function is moderately reduced. Stress EF: 37%. End diastolic cavity size is moderately enlarged. End systolic cavity size is moderately enlarged.   Myocardial blood flow was computed to be 0.40ml/g/min at rest and 1.22ml/g/min at stress. Global myocardial blood flow reserve was 1.67 and was abnormal.   Coronary calcium was present on the attenuation correction CT images. Severe coronary calcifications were present. Coronary calcifications were present in the left anterior descending artery, left circumflex artery and right coronary artery distribution(s).   The study is intermediate risk.   Electronically signed by Epifanio Lesches, MD  CLINICAL DATA:  This over-read does not include interpretation of cardiac or coronary anatomy or pathology. The Cardiac PET CT interpretation by the cardiologist is attached.  COMPARISON:  CT chest angiogram, 01/08/2023  FINDINGS: Cardiovascular: Aortic atherosclerosis. Dense aortic valve calcifications.  Cardiomegaly. Three-vessel coronary artery calcifications. Enlargement of the main pulmonary artery measuring up to 3.9 cm in caliber. No pericardial effusion.  Limited Mediastinum/Nodes: No enlarged mediastinal, hilar, or axillary lymph nodes. Trachea and esophagus demonstrate no significant findings.  Limited Lungs/Pleura: Small bilateral pleural effusions. Interlobular septal thickening throughout the lung bases. Mosaic attenuation of the airspaces.  Upper Abdomen: No acute abnormality.  Musculoskeletal: No chest wall abnormality. No acute osseous findings.  IMPRESSION: 1. Small bilateral pleural effusions and interlobular septal thickening throughout the lung bases, consistent with pulmonary edema. 2. Mosaic attenuation of the airspaces, consistent with small airways disease. 3. Cardiomegaly and coronary artery disease. 4. Enlargement of the main pulmonary artery, as can be seen in pulmonary hypertension. 5. Dense aortic valve calcifications. Correlate for echocardiographic evidence of aortic valve dysfunction.  Aortic Atherosclerosis (ICD10-I70.0).   Electronically Signed By: Jearld Lesch M.D. On: 03/05/2023 14:20   ECHOCARDIOGRAM  ECHOCARDIOGRAM COMPLETE 03/18/2023  Narrative ECHOCARDIOGRAM REPORT    Patient Name:   REGGINALD PASK Date of Exam: 03/18/2023 Medical Rec #:  [id number]       Height:       67.0 in Accession #:    [id number]      Weight:       184.4 lb Date of Birth:  Jan 21, 1961        BSA:          1.954 m Patient Age:    63 years        BP:  135/65 mmHg Patient Gender: M               HR:           65 bpm. Exam Location:  Inpatient  Procedure: 2D Echo, Cardiac Doppler and Color Doppler (Both Spectral and Color Flow Doppler were utilized during procedure).  Indications:    Dyspnea  History:        Patient has prior history of Echocardiogram examinations, most recent 01/09/2023. CHF, Stroke, Aortic Valve  Disease, Signs/Symptoms:Edema; Risk Factors:Hypertension.  Sonographer:    Amy Chionchio Referring Phys: [id number] Elenor Quinones DAVIS  IMPRESSIONS   1. Left ventricular ejection fraction, by estimation, is 45 to 50%. Left ventricular ejection fraction by 2D MOD biplane is 49.7 %. The left ventricle has mildly decreased function. The left ventricle demonstrates global hypokinesis. The left ventricular internal cavity size was mildly dilated. Left ventricular diastolic parameters are consistent with Grade II diastolic dysfunction (pseudonormalization). 2. Right ventricular systolic function is normal. The right ventricular size is normal. There is moderately elevated pulmonary artery systolic pressure. The estimated right ventricular systolic pressure is 59.6 mmHg. 3. Left atrial size was moderately dilated. 4. Right atrial size was mildly dilated. 5. The mitral valve is degenerative. No evidence of mitral valve regurgitation. No evidence of mitral stenosis. Moderate mitral annular calcification. 6. The aortic valve was not well visualized. There is severe calcifcation of the aortic valve. Aortic valve regurgitation is not visualized. Mild to moderate aortic valve stenosis. Aortic valve area, by VTI measures 1.23 cm. Aortic valve mean gradient measures 18.0 mmHg. 7. The inferior vena cava is dilated in size with >50% respiratory variability, suggesting right atrial pressure of 8 mmHg.  FINDINGS Left Ventricle: Left ventricular ejection fraction, by estimation, is 45 to 50%. Left ventricular ejection fraction by 2D MOD biplane is 49.7 %. The left ventricle has mildly decreased function. The left ventricle demonstrates global hypokinesis. Strain imaging was not performed. The left ventricular internal cavity size was mildly dilated. There is no left ventricular hypertrophy. Left ventricular diastolic parameters are consistent with Grade II diastolic dysfunction (pseudonormalization).  Right  Ventricle: The right ventricular size is normal. No increase in right ventricular wall thickness. Right ventricular systolic function is normal. There is moderately elevated pulmonary artery systolic pressure. The tricuspid regurgitant velocity is 3.59 m/s, and with an assumed right atrial pressure of 8 mmHg, the estimated right ventricular systolic pressure is 59.6 mmHg.  Left Atrium: Left atrial size was moderately dilated.  Right Atrium: Right atrial size was mildly dilated.  Pericardium: There is no evidence of pericardial effusion.  Mitral Valve: The mitral valve is degenerative in appearance. Moderate mitral annular calcification. No evidence of mitral valve regurgitation. No evidence of mitral valve stenosis. MV peak gradient, 9.5 mmHg. The mean mitral valve gradient is 3.0 mmHg.  Tricuspid Valve: The tricuspid valve is normal in structure. Tricuspid valve regurgitation is mild.  Aortic Valve: The aortic valve was not well visualized. There is severe calcifcation of the aortic valve. Aortic valve regurgitation is not visualized. Mild to moderate aortic stenosis is present. Aortic valve mean gradient measures 18.0 mmHg. Aortic valve peak gradient measures 32.0 mmHg. Aortic valve area, by VTI measures 1.23 cm.  Pulmonic Valve: The pulmonic valve was normal in structure. Pulmonic valve regurgitation is not visualized.  Aorta: The aortic root is normal in size and structure.  Venous: The inferior vena cava is dilated in size with greater than 50% respiratory variability, suggesting right atrial pressure of 8  mmHg.  IAS/Shunts: No atrial level shunt detected by color flow Doppler.  Additional Comments: 3D imaging was not performed.   LEFT VENTRICLE PLAX 2D                        Biplane EF (MOD) LVIDd:         6.20 cm         LV Biplane EF:   Left LVIDs:         5.30 cm                          ventricular LV PW:         0.90 cm                          ejection LV IVS:        0.80  cm                          fraction by LVOT diam:     2.10 cm                          2D MOD LV SV:         75                               biplane is LV SV Index:   38                               49.7 %. LVOT Area:     3.46 cm Diastology LV e' medial:    5.00 cm/s LV Volumes (MOD)               LV E/e' medial:  26.4 LV vol d, MOD    188.0 ml      LV e' lateral:   6.42 cm/s A2C:                           LV E/e' lateral: 20.6 LV vol d, MOD    170.0 ml A4C: LV vol s, MOD    94.3 ml A2C: LV vol s, MOD    80.0 ml A4C: LV SV MOD A2C:   93.7 ml LV SV MOD A4C:   170.0 ml LV SV MOD BP:    90.5 ml  RIGHT VENTRICLE             IVC RV Basal diam:  3.80 cm     IVC diam: 2.40 cm RV S prime:     10.70 cm/s TAPSE (M-mode): 2.1 cm  LEFT ATRIUM              Index        RIGHT ATRIUM           Index LA Vol (A2C):   89.5 ml  45.81 ml/m  RA Area:     23.80 cm LA Vol (A4C):   117.0 ml 59.89 ml/m  RA Volume:   80.80 ml  41.36 ml/m LA Biplane Vol: 110.0 ml 56.31 ml/m AORTIC VALVE AV Area (Vmax):    1.31 cm AV Area (Vmean):   1.17 cm AV Area (VTI):  1.23 cm AV Vmax:           283.00 cm/s AV Vmean:          203.000 cm/s AV VTI:            0.608 m AV Peak Grad:      32.0 mmHg AV Mean Grad:      18.0 mmHg LVOT Vmax:         107.00 cm/s LVOT Vmean:        68.700 cm/s LVOT VTI:          0.216 m LVOT/AV VTI ratio: 0.36  AORTA Ao Root diam: 3.10 cm Ao Asc diam:  3.40 cm  MITRAL VALVE                TRICUSPID VALVE MV Area (PHT): 3.03 cm     TR Peak grad:   51.6 mmHg MV Area VTI:   1.45 cm     TR Vmax:        359.00 cm/s MV Peak grad:  9.5 mmHg MV Mean grad:  3.0 mmHg     SHUNTS MV Vmax:       1.54 m/s     Systemic VTI:  0.22 m MV Vmean:      84.2 cm/s    Systemic Diam: 2.10 cm MV Decel Time: 250 msec MV E velocity: 132.00 cm/s MV A velocity: 102.00 cm/s MV E/A ratio:  1.29  Dalton McleanMD Electronically signed by Wilfred Lacy Signature Date/Time:  03/18/2023/12:50:09 PM    Final          ______________________________________________________________________________________________      Risk Assessment/Calculations:    CHA2DS2-VASc Score = 3   This indicates a 3.2% annual risk of stroke. The patient's score is based upon: CHF History: 1 HTN History: 1 Diabetes History: 0 Stroke History: 0 Vascular Disease History: 1 Age Score: 0 Gender Score: 0            Physical Exam:   VS:  BP 130/60 (BP Location: Right Arm, Patient Position: Sitting, Cuff Size: Normal)   Pulse 64   Ht 5\' 7"  (1.702 m)   Wt 190 lb 6.4 oz (86.4 kg)   SpO2 97%   BMI 29.82 kg/m    Wt Readings from Last 3 Encounters:  04/02/23 190 lb 6.4 oz (86.4 kg)  04/02/23 189 lb 9.6 oz (86 kg)  03/23/23 184 lb 11.9 oz (83.8 kg)    GEN: Well nourished, well developed in no acute distress NECK: No JVD; No carotid bruits CARDIAC: RRR, no murmurs, rubs, gallops RESPIRATORY:  Clear to auscultation without rales, wheezing or rhonchi  ABDOMEN: Soft, non-tender, non-distended EXTREMITIES:  No edema; No deformity   ASSESSMENT AND PLAN: .    Chest pain/coronary artery disease/HLD, LDL goal less than 70- Prior LHC 2016 with no significant obstructive coronary artery disease. Cardiac PET 03/06/23 with severe coronary calcifications, myocardial blood flow reverse decreased globally suggestive of severe multivessel cAD vs microvascular disease. Given progressive decline in EF, chest pain, exertional dyspnea plan for LHC. Today reports continued episodes of "lightning" chest pain though dyspnea improved with inhaler changes.  GDMT includes aspirin 81mg  daily, lopressor 50mg  BID, pravastatin 40mg  daily.  Will discuss with Dr. Rennis Golden timing of LHC - non HD day then HD at his usual dialysis center next day versus attempt to dialyze inpatient  Systolic heart failure - cardiac PET 03/05/23 LVEF 35-37%. Volume managed with HD. GDMT includes Hydralazine 50mg  TID, Imdur 60mg   daily, Lopressor  50mg  BID. Recommend low sodium diet <2g and <2L fluid intake per day.   HTN- BP well controlled. Continue current antihypertensive regimen amlodipine 10 mg daily, doxazosin 4 mg nightly, hydralazine 50 mg 3 times daily, Imdur 60 mg daily, Lopressor 50 mg twice daily.  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  PAF - Episodes have been brief, in setting of acute illness, and self limiting. Will defer OAC as episodes have been self limited.   COPD / Anxiety / Depression - Continue to follow with PCP.     Informed Consent   Shared Decision Making/Informed Consent The risks [stroke (1 in 1000), death (1 in 1000), kidney failure [usually temporary] (1 in 500), bleeding (1 in 200), allergic reaction [possibly serious] (1 in 200)], benefits (diagnostic support and management of coronary artery disease) and alternatives of a cardiac catheterization were discussed in detail with Mr. Shean and he is willing to proceed.     Dispo: follow up 2-3 weeks after LHC  Signed, Alver Sorrow, NP

## 2023-04-02 NOTE — Patient Instructions (Signed)
 Medication Instructions:  Your physician recommends that you continue on your current medications as directed. Please refer to the Current Medication list given to you today.  *If you need a refill on your cardiac medications before your next appointment, please call your pharmacy*   Lab Work: Done at dialysis we will request these    Testing/Procedures:  We will get you scheduled for heart cath and call you wait date and time.   Free valet parking service is available. You will check in at ADMITTING. The support person will be asked to wait in the waiting room.  It is OK to have someone drop you off and come back when you are ready to be discharged.    Special note: Every effort is made to have your procedure done on time. Please understand that emergencies sometimes delay scheduled procedures.  2. Diet: Do not eat solid foods after midnight.  The patient may have clear liquids until 5am upon the day of the procedure.  3. Labs: done   4. Medication instructions in preparation for your procedure:  On the morning of your procedure, take your Aspirin 81 mg and any morning medicines NOT listed above.  You may use sips of water.  5. Plan to go home the same day, you will only stay overnight if medically necessary. 6. Bring a current list of your medications and current insurance cards. 7. You MUST have a responsible person to drive you home. 8. Someone MUST be with you the first 24 hours after you arrive home or your discharge will be delayed. 9. Please wear clothes that are easy to get on and off and wear slip-on shoes.  Thank you for allowing [place] to care for you!   -- [facility]    Follow-Up: At [facility], you and your health needs are our priority.  As part of our continuing mission to provide you with exceptional heart care, we have created designated Provider Care Teams.  These Care Teams include your primary Cardiologist (physician)  and Advanced Practice Providers (APPs -  Physician Assistants and Nurse Practitioners) who all work together to provide you with the care you need, when you need it.  We recommend signing up for the patient portal called "MyChart".  Sign up information is provided on this After Visit Summary.  MyChart is used to connect with patients for Virtual Visits (Telemedicine).  Patients are able to view lab/test results, encounter notes, upcoming appointments, etc.  Non-urgent messages can be sent to your provider as well.   To learn more about what you can do with MyChart, go to [email or web address].    Your next appointment:   2 weeks post Heart Cath- we will schedule this once we know your heart cath date

## 2023-04-03 ENCOUNTER — Telehealth: Payer: Self-pay | Admitting: *Deleted

## 2023-04-03 DIAGNOSIS — N186 End stage renal disease: Secondary | ICD-10-CM | POA: Diagnosis not present

## 2023-04-03 DIAGNOSIS — N2581 Secondary hyperparathyroidism of renal origin: Secondary | ICD-10-CM | POA: Diagnosis not present

## 2023-04-03 DIAGNOSIS — Z992 Dependence on renal dialysis: Secondary | ICD-10-CM | POA: Diagnosis not present

## 2023-04-03 DIAGNOSIS — D631 Anemia in chronic kidney disease: Secondary | ICD-10-CM | POA: Diagnosis not present

## 2023-04-03 DIAGNOSIS — D509 Iron deficiency anemia, unspecified: Secondary | ICD-10-CM | POA: Diagnosis not present

## 2023-04-03 LAB — LAB REPORT - SCANNED: A1c: 6.5

## 2023-04-03 NOTE — Progress Notes (Signed)
 Complex Care Management Care Guide Note  04/03/2023 Name: GARO HEIDELBERG MRN: [id number] DOB: 04/05/1960  [name] is a 63 y.o. year old male who is a primary care patient of Ngetich, Dinah C, NP and is actively engaged with the care management team. I reached out to [name] by phone today to assist with re-scheduling  with the RN Case Manager.  Follow up plan: Unsuccessful telephone outreach attempt made.   Gwenevere Ghazi  [facility]  [facility] Guide  Direct Dial: [phone]  Fax [phone]

## 2023-04-04 ENCOUNTER — Telehealth (HOSPITAL_BASED_OUTPATIENT_CLINIC_OR_DEPARTMENT_OTHER): Payer: Self-pay

## 2023-04-04 DIAGNOSIS — I10 Essential (primary) hypertension: Secondary | ICD-10-CM

## 2023-04-04 DIAGNOSIS — I5022 Chronic systolic (congestive) heart failure: Secondary | ICD-10-CM

## 2023-04-04 DIAGNOSIS — I25118 Atherosclerotic heart disease of native coronary artery with other forms of angina pectoris: Secondary | ICD-10-CM

## 2023-04-04 NOTE — Telephone Encounter (Signed)
 Mychart message  not read, call wife (ok per DPR),  no answer, left message to call back

## 2023-04-04 NOTE — Telephone Encounter (Addendum)
 Call to schedule cath on 3/13  ----- Message from Alver Sorrow sent at 04/03/2023  1:07 PM EDT ----- Dr. Rennis Golden said okay to proceed with LHC on a Tues or Thurs then do his usual HD the next day at his dialysis center ----- Message ----- From: Chrystie Nose, MD Sent: 04/03/2023   8:08 AM EDT To: Alver Sorrow, NP  I would do the cath on a non-dialysis day .Marland Kitchen Would work fine. Thanks.  -[place] ----- Message ----- From: Alver Sorrow, NP Sent: 04/02/2023   7:50 PM EDT To: Chrystie Nose, MD  Reviewed cardiac PET. Dyspnea improved with changes in inhalers. Still with episodes of chest pain which he describes as "lightning". Discussed LHC and he is agreeable.     Reasonable to do LHC on a non-HD day then have him do HD at his usual center the next day? I spoke with Dr. Duke Salvia about it as she was DOD due to his HFrEF and ESRD. She suggested LHC and HD same day inpatient. I reached out to [employer] and [employer] and that didn't seem to be feasible to coordinate.  I'll get his cath ordered/scheduled based on your preference.

## 2023-04-04 NOTE — Addendum Note (Signed)
 Addended by: Alver Sorrow on: 04/04/2023 01:11 PM   Modules accepted: Orders

## 2023-04-05 DIAGNOSIS — Z992 Dependence on renal dialysis: Secondary | ICD-10-CM | POA: Diagnosis not present

## 2023-04-05 DIAGNOSIS — N186 End stage renal disease: Secondary | ICD-10-CM | POA: Diagnosis not present

## 2023-04-05 DIAGNOSIS — D509 Iron deficiency anemia, unspecified: Secondary | ICD-10-CM | POA: Diagnosis not present

## 2023-04-05 DIAGNOSIS — N2581 Secondary hyperparathyroidism of renal origin: Secondary | ICD-10-CM | POA: Diagnosis not present

## 2023-04-05 DIAGNOSIS — D631 Anemia in chronic kidney disease: Secondary | ICD-10-CM | POA: Diagnosis not present

## 2023-04-05 NOTE — Telephone Encounter (Signed)
 Tried secondary number in chart, no answer, unable to leave VM

## 2023-04-05 NOTE — Telephone Encounter (Signed)
 4th call attempt, one ring & straight to full VM

## 2023-04-05 NOTE — Telephone Encounter (Signed)
 2nd call attempt to patient, no answer, left detailed message with time and date of heart cath and instructions to get labs today and to call back. No labs faxed from Dialysis center despite call and fax request. Labs ordered.

## 2023-04-05 NOTE — Addendum Note (Signed)
 Addended by: Marlene Lard on: 04/05/2023 10:09 AM   Modules accepted: Orders

## 2023-04-05 NOTE — Telephone Encounter (Signed)
 Attempted to call patient again, no answer, VM now full.

## 2023-04-06 ENCOUNTER — Other Ambulatory Visit: Payer: Self-pay | Admitting: Family

## 2023-04-08 ENCOUNTER — Telehealth: Payer: Self-pay | Admitting: *Deleted

## 2023-04-08 DIAGNOSIS — D631 Anemia in chronic kidney disease: Secondary | ICD-10-CM | POA: Diagnosis not present

## 2023-04-08 DIAGNOSIS — N2581 Secondary hyperparathyroidism of renal origin: Secondary | ICD-10-CM | POA: Diagnosis not present

## 2023-04-08 DIAGNOSIS — Z992 Dependence on renal dialysis: Secondary | ICD-10-CM | POA: Diagnosis not present

## 2023-04-08 DIAGNOSIS — D509 Iron deficiency anemia, unspecified: Secondary | ICD-10-CM | POA: Diagnosis not present

## 2023-04-08 DIAGNOSIS — N186 End stage renal disease: Secondary | ICD-10-CM | POA: Diagnosis not present

## 2023-04-08 NOTE — Telephone Encounter (Signed)
 High risk or very high risk warning populated when attempting to refill medication. RX request sent to PCP for review and approval if warranted.

## 2023-04-08 NOTE — Telephone Encounter (Addendum)
 Cardiac Catheterization scheduled at [facility] for: Tuesday April 09, 2023 10:30 AM Arrival time [facility] Main Entrance A at: 8:30 AM  Nothing to eat after midnight prior to procedure, clear liquids until 5 AM day of procedure.  Medication instructions: -Usual morning medications can be taken with sips of water including aspirin 81 mg.  Plan to go home the same day, you will only stay overnight if medically necessary.  You must have responsible adult to drive you home.  Someone must be with you the first 24 hours after you arrive home.  Reviewed procedure instructions with patient's wife (DPR), Chyrl Civatte (best number to reach them [phone]).  I confirmed MWF dialysis schedule and pt's wife aware patient should plan dialysis Wednesday 3/19 at his outpatient dialysis center.

## 2023-04-08 NOTE — Telephone Encounter (Signed)
 Received secure chat from Katina Dung, patient aware of cath and will be there.

## 2023-04-09 ENCOUNTER — Encounter (HOSPITAL_COMMUNITY): Payer: Self-pay | Admitting: Cardiology

## 2023-04-09 ENCOUNTER — Other Ambulatory Visit: Payer: Self-pay

## 2023-04-09 ENCOUNTER — Ambulatory Visit (HOSPITAL_COMMUNITY)
Admission: RE | Admit: 2023-04-09 | Discharge: 2023-04-09 | Disposition: A | Discharge status: Home / Self Care | Attending: Cardiology | Admitting: Cardiology

## 2023-04-09 ENCOUNTER — Ambulatory Visit (HOSPITAL_COMMUNITY)
Admission: RE | Disposition: A | Discharge status: Home / Self Care | Payer: Self-pay | Source: Home / Self Care | Attending: Cardiology

## 2023-04-09 DIAGNOSIS — D631 Anemia in chronic kidney disease: Secondary | ICD-10-CM | POA: Diagnosis not present

## 2023-04-09 DIAGNOSIS — E785 Hyperlipidemia, unspecified: Secondary | ICD-10-CM | POA: Insufficient documentation

## 2023-04-09 DIAGNOSIS — F419 Anxiety disorder, unspecified: Secondary | ICD-10-CM | POA: Insufficient documentation

## 2023-04-09 DIAGNOSIS — Z7982 Long term (current) use of aspirin: Secondary | ICD-10-CM | POA: Diagnosis not present

## 2023-04-09 DIAGNOSIS — T8612 Kidney transplant failure: Secondary | ICD-10-CM | POA: Diagnosis not present

## 2023-04-09 DIAGNOSIS — I5022 Chronic systolic (congestive) heart failure: Secondary | ICD-10-CM | POA: Diagnosis not present

## 2023-04-09 DIAGNOSIS — I132 Hypertensive heart and chronic kidney disease with heart failure and with stage 5 chronic kidney disease, or end stage renal disease: Secondary | ICD-10-CM | POA: Insufficient documentation

## 2023-04-09 DIAGNOSIS — I480 Paroxysmal atrial fibrillation: Secondary | ICD-10-CM | POA: Insufficient documentation

## 2023-04-09 DIAGNOSIS — Z992 Dependence on renal dialysis: Secondary | ICD-10-CM | POA: Diagnosis not present

## 2023-04-09 DIAGNOSIS — N186 End stage renal disease: Secondary | ICD-10-CM | POA: Diagnosis not present

## 2023-04-09 DIAGNOSIS — G4733 Obstructive sleep apnea (adult) (pediatric): Secondary | ICD-10-CM | POA: Insufficient documentation

## 2023-04-09 DIAGNOSIS — J441 Chronic obstructive pulmonary disease with (acute) exacerbation: Secondary | ICD-10-CM | POA: Diagnosis not present

## 2023-04-09 DIAGNOSIS — M109 Gout, unspecified: Secondary | ICD-10-CM | POA: Insufficient documentation

## 2023-04-09 DIAGNOSIS — Y839 Surgical procedure, unspecified as the cause of abnormal reaction of the patient, or of later complication, without mention of misadventure at the time of the procedure: Secondary | ICD-10-CM | POA: Diagnosis not present

## 2023-04-09 DIAGNOSIS — R9389 Abnormal findings on diagnostic imaging of other specified body structures: Secondary | ICD-10-CM | POA: Diagnosis not present

## 2023-04-09 DIAGNOSIS — I2510 Atherosclerotic heart disease of native coronary artery without angina pectoris: Secondary | ICD-10-CM | POA: Diagnosis not present

## 2023-04-09 DIAGNOSIS — Z79899 Other long term (current) drug therapy: Secondary | ICD-10-CM | POA: Insufficient documentation

## 2023-04-09 DIAGNOSIS — F32A Depression, unspecified: Secondary | ICD-10-CM | POA: Diagnosis not present

## 2023-04-09 DIAGNOSIS — Z9889 Other specified postprocedural states: Secondary | ICD-10-CM

## 2023-04-09 DIAGNOSIS — J45901 Unspecified asthma with (acute) exacerbation: Secondary | ICD-10-CM | POA: Diagnosis not present

## 2023-04-09 DIAGNOSIS — Q8781 Alport syndrome: Secondary | ICD-10-CM | POA: Insufficient documentation

## 2023-04-09 DIAGNOSIS — I350 Nonrheumatic aortic (valve) stenosis: Secondary | ICD-10-CM | POA: Insufficient documentation

## 2023-04-09 DIAGNOSIS — I25118 Atherosclerotic heart disease of native coronary artery with other forms of angina pectoris: Secondary | ICD-10-CM

## 2023-04-09 DIAGNOSIS — I420 Dilated cardiomyopathy: Secondary | ICD-10-CM | POA: Insufficient documentation

## 2023-04-09 HISTORY — PX: LEFT HEART CATH AND CORONARY ANGIOGRAPHY: CATH118249

## 2023-04-09 LAB — BASIC METABOLIC PANEL
Anion gap: 11 (ref 5–15)
BUN: 21 mg/dL (ref 8–23)
CO2: 30 mmol/L (ref 22–32)
Calcium: 9.2 mg/dL (ref 8.9–10.3)
Chloride: 97 mmol/L — ABNORMAL LOW (ref 98–111)
Creatinine, Ser: 7.82 mg/dL — ABNORMAL HIGH (ref 0.61–1.24)
GFR, Estimated: 7 mL/min — ABNORMAL LOW (ref >60)
Glucose, Bld: 125 mg/dL — ABNORMAL HIGH (ref 70–99)
Potassium: 4 mmol/L (ref 3.5–5.1)
Sodium: 138 mmol/L (ref 135–145)

## 2023-04-09 SURGERY — LEFT HEART CATH AND CORONARY ANGIOGRAPHY
Anesthesia: LOCAL

## 2023-04-09 MED ORDER — SODIUM CHLORIDE 0.9 % IV SOLN
INTRAVENOUS | Status: DC | PRN
  Administered 2023-04-09: 10 mL/h via INTRAVENOUS

## 2023-04-09 MED ORDER — HYDRALAZINE HCL 20 MG/ML IJ SOLN
10.0000 mg | INTRAMUSCULAR | Status: DC | PRN
  Filled 2023-04-09: qty 1

## 2023-04-09 MED ORDER — HEPARIN (PORCINE) IN NACL 1000-0.9 UT/500ML-% IV SOLN
INTRAVENOUS | Status: DC | PRN
  Administered 2023-04-09 (×2): 500 mL

## 2023-04-09 MED ORDER — SODIUM CHLORIDE 0.9 % IV SOLN: 250.0000 mL | INTRAVENOUS | Status: DC | PRN

## 2023-04-09 MED ORDER — LABETALOL HCL 5 MG/ML IV SOLN: 10.0000 mg | INTRAVENOUS | Status: DC | PRN

## 2023-04-09 MED ORDER — LIDOCAINE HCL (PF) 1 % IJ SOLN
INTRAMUSCULAR | Status: DC | PRN
  Administered 2023-04-09: 10 mL

## 2023-04-09 MED ORDER — SODIUM CHLORIDE 0.9% FLUSH: 3.0000 mL | Freq: Two times a day (BID) | INTRAVENOUS | Status: DC

## 2023-04-09 MED ORDER — SODIUM CHLORIDE 0.9% FLUSH: 3.0000 mL | INTRAVENOUS | Status: DC | PRN

## 2023-04-09 MED ORDER — MIDAZOLAM HCL 2 MG/2ML IJ SOLN
INTRAMUSCULAR | Status: DC | PRN
  Administered 2023-04-09 (×2): 1 mg via INTRAVENOUS

## 2023-04-09 MED ORDER — ASPIRIN 81 MG PO CHEW: 81.0000 mg | CHEWABLE_TABLET | ORAL | Status: DC

## 2023-04-09 MED ORDER — HEPARIN SODIUM (PORCINE) 1000 UNIT/ML IJ SOLN
INTRAMUSCULAR | Status: AC
  Filled 2023-04-09: qty 10

## 2023-04-09 MED ORDER — FENTANYL CITRATE (PF) 100 MCG/2ML IJ SOLN
INTRAMUSCULAR | Status: DC | PRN
  Administered 2023-04-09 (×2): 25 ug via INTRAVENOUS
  Administered 2023-04-09: 50 ug via INTRAVENOUS

## 2023-04-09 MED ORDER — FENTANYL CITRATE (PF) 100 MCG/2ML IJ SOLN
INTRAMUSCULAR | Status: AC
  Filled 2023-04-09: qty 2

## 2023-04-09 MED ORDER — VERAPAMIL HCL 2.5 MG/ML IV SOLN
INTRAVENOUS | Status: AC
  Filled 2023-04-09: qty 2

## 2023-04-09 MED ORDER — LIDOCAINE HCL (PF) 1 % IJ SOLN
INTRAMUSCULAR | Status: AC
  Filled 2023-04-09: qty 30

## 2023-04-09 MED ORDER — SODIUM CHLORIDE 0.9 % IV SOLN: INTRAVENOUS | Status: DC

## 2023-04-09 MED ORDER — ONDANSETRON HCL 4 MG/2ML IJ SOLN: 4.0000 mg | Freq: Four times a day (QID) | INTRAMUSCULAR | Status: DC | PRN

## 2023-04-09 MED ORDER — IOHEXOL 350 MG/ML SOLN
INTRAVENOUS | Status: DC | PRN
  Administered 2023-04-09: 45 mL

## 2023-04-09 MED ORDER — MIDAZOLAM HCL 2 MG/2ML IJ SOLN
INTRAMUSCULAR | Status: AC
  Filled 2023-04-09: qty 2

## 2023-04-09 SURGICAL SUPPLY — 9 items
BAG SNAP BAND KOVER 36X36 (MISCELLANEOUS) IMPLANT
CATH INFINITI 5FR MULTPACK ANG (CATHETERS) IMPLANT
PACK CARDIAC CATHETERIZATION (CUSTOM PROCEDURE TRAY) ×1 IMPLANT
SET ATX-X65L (MISCELLANEOUS) IMPLANT
SHEATH PINNACLE 5F 10CM (SHEATH) IMPLANT
SHEATH PROBE COVER 6X72 (BAG) IMPLANT
WIRE EMERALD 3MM-J .035X150CM (WIRE) IMPLANT
WIRE MICRO SET SILHO 5FR 7 (SHEATH) IMPLANT
WIRE MICROINTRODUCER 60CM (WIRE) IMPLANT

## 2023-04-09 NOTE — Discharge Instructions (Signed)
 Femoral Site Care This sheet gives you information about how to care for yourself after your procedure. Your health care provider may also give you more specific instructions. If you have problems or questions, contact your health care provider. What can I expect after the procedure?  After the procedure, it is common to have: Bruising that usually fades within 1-2 weeks. Tenderness at the site. Follow these instructions at home: Wound care Follow instructions from your health care provider about how to take care of your insertion site. Make sure you: Wash your hands with soap and water before you change your bandage (dressing). If soap and water are not available, use hand sanitizer. Remove your dressing as told by your health care provider. In 24 hours Do not take baths, swim, or use a hot tub until your health care provider approves. You may shower 24-48 hours after the procedure or as told by your health care provider. Gently wash the site with plain soap and water. Pat the area dry with a clean towel. Do not rub the site. This may cause bleeding. Do not apply powder or lotion to the site. Keep the site clean and dry. Check your femoral site every day for signs of infection. Check for: Redness, swelling, or pain. Fluid or blood. Warmth. Pus or a bad smell. Activity For the first 2-3 days after your procedure, or as long as directed: Avoid climbing stairs as much as possible. Do not squat. Do not lift anything that is heavier than 10 lb (4.5 kg), or the limit that you are told, until your health care provider says that it is safe. For 5 days Rest as directed. Avoid sitting for a long time without moving. Get up to take short walks every 1-2 hours. Do not drive for 24 hours if you were given a medicine to help you relax (sedative). General instructions Take over-the-counter and prescription medicines only as told by your health care provider. Keep all follow-up visits as told by  your health care provider. This is important. Contact a health care provider if you have: A fever or chills. You have redness, swelling, or pain around your insertion site. Get help right away if: The catheter insertion area swells very fast. You pass out. You suddenly start to sweat or your skin gets clammy. The catheter insertion area is bleeding, and the bleeding does not stop when you hold steady pressure on the area. The area near or just beyond the catheter insertion site becomes pale, cool, tingly, or numb. These symptoms may represent a serious problem that is an emergency. Do not wait to see if the symptoms will go away. Get medical help right away. Call your local emergency services (911 in the [place] Do not drive yourself to the hospital. Summary After the procedure, it is common to have bruising that usually fades within 1-2 weeks. Check your femoral site every day for signs of infection. Do not lift anything that is heavier than 10 lb (4.5 kg), or the limit that you are told, until your health care provider says that it is safe. This information is not intended to replace advice given to you by your health care provider. Make sure you discuss any questions you have with your health care provider. Document Revised: 01/21/2017 Document Reviewed: 01/21/2017 Elsevier Patient Education  2020 [employer].

## 2023-04-09 NOTE — Interval H&P Note (Signed)
 History and Physical Interval Note:  04/09/2023 10:10 AM  Kirk [name]  has presented today for surgery, with the diagnosis of cardiomyopathy with abnormal stress test.  The various methods of treatment have been discussed with the patient and family. After consideration of risks, benefits and other options for treatment, the patient has consented to  Procedure(s): LEFT HEART CATH AND CORONARY ANGIOGRAPHY (N/A)  PERCUTANEOUS CORONARY INTERVENTION  as a surgical intervention.  The patient's history has been reviewed, patient examined, no change in status, stable for surgery.  I have reviewed the patient's chart and labs.  Questions were answered to the patient's satisfaction.     Bryan Lemma

## 2023-04-09 NOTE — Progress Notes (Signed)
 Patient arrived to cath lab  holding bay 14.  Right groin   64fr sheath in place with dressing dry/intact. VSS. Patient denies any distress. 50fr sheath removed intact with no bleeding or hematoma palpable pre or post removal. Manual pressure applied x25. 4x4 gauze and tegaderm dressing applied to site. No bleeding or hematoma noted. Post activity and precautions explained. Patient verbalized understanding. Will transport to short stay for completion of bed rest and recovery.Kirk [name]

## 2023-04-10 DIAGNOSIS — N2581 Secondary hyperparathyroidism of renal origin: Secondary | ICD-10-CM | POA: Diagnosis not present

## 2023-04-10 DIAGNOSIS — N186 End stage renal disease: Secondary | ICD-10-CM | POA: Diagnosis not present

## 2023-04-10 DIAGNOSIS — D509 Iron deficiency anemia, unspecified: Secondary | ICD-10-CM | POA: Diagnosis not present

## 2023-04-10 DIAGNOSIS — D631 Anemia in chronic kidney disease: Secondary | ICD-10-CM | POA: Diagnosis not present

## 2023-04-10 DIAGNOSIS — Z992 Dependence on renal dialysis: Secondary | ICD-10-CM | POA: Diagnosis not present

## 2023-04-10 MED FILL — Heparin Sodium (Porcine) Inj 1000 Unit/ML: INTRAMUSCULAR | Qty: 10 | Status: AC

## 2023-04-10 MED FILL — Verapamil HCl IV Soln 2.5 MG/ML: INTRAVENOUS | Qty: 2 | Status: AC

## 2023-04-11 NOTE — Progress Notes (Signed)
 Complex Care Management Care Guide Note  04/11/2023 Name: Kirk [name] MRN: [id number] DOB: 11-15-1960  Kirk [name] is a 63 y.o. year old male who is a primary care patient of Ngetich, Dinah C, NP and is actively engaged with the care management team. I reached out to Kirk [name] by phone today to assist with re-scheduling  with the RN Case Manager.  Follow up plan: Unsuccessful telephone outreach attempt made. A HIPAA compliant phone message was left for the patient providing contact information and requesting a return call.No further outreach attempts will be made at this time. We have been unable to contact the patient to reschedule for complex care management services.   Gwenevere Ghazi  [facility]  [facility], [facility] Guide  Direct Dial: [phone]  Fax [phone]

## 2023-04-12 DIAGNOSIS — D631 Anemia in chronic kidney disease: Secondary | ICD-10-CM | POA: Diagnosis not present

## 2023-04-12 DIAGNOSIS — D509 Iron deficiency anemia, unspecified: Secondary | ICD-10-CM | POA: Diagnosis not present

## 2023-04-12 DIAGNOSIS — Z992 Dependence on renal dialysis: Secondary | ICD-10-CM | POA: Diagnosis not present

## 2023-04-12 DIAGNOSIS — N186 End stage renal disease: Secondary | ICD-10-CM | POA: Diagnosis not present

## 2023-04-12 DIAGNOSIS — N2581 Secondary hyperparathyroidism of renal origin: Secondary | ICD-10-CM | POA: Diagnosis not present

## 2023-04-15 ENCOUNTER — Telehealth: Payer: Self-pay | Admitting: Family

## 2023-04-15 DIAGNOSIS — D509 Iron deficiency anemia, unspecified: Secondary | ICD-10-CM | POA: Diagnosis not present

## 2023-04-15 DIAGNOSIS — D631 Anemia in chronic kidney disease: Secondary | ICD-10-CM | POA: Diagnosis not present

## 2023-04-15 DIAGNOSIS — N2581 Secondary hyperparathyroidism of renal origin: Secondary | ICD-10-CM | POA: Diagnosis not present

## 2023-04-15 DIAGNOSIS — N186 End stage renal disease: Secondary | ICD-10-CM | POA: Diagnosis not present

## 2023-04-15 DIAGNOSIS — Z992 Dependence on renal dialysis: Secondary | ICD-10-CM | POA: Diagnosis not present

## 2023-04-15 NOTE — Telephone Encounter (Signed)
 Copied from CRM [id number]. Topic: General - Call Back - No Documentation >> Apr 12, 2023 12:04 PM Brittney F wrote: Reason for CRM:   Patient's wife, Kirk [name], called in on her husband's behalf as she stated there was a missed call from the office on 04/11/2023. There was no documentation as for the reason for the call. Agent did check in with the patient's wife to see if a hospital follow up may be appropriate but the wife informed the agent that the patient was able to be sent home on the 18th and was not admitted to the hospital where he had his procedure.   Callback number: [phone] Kirk [name]  This patient was not on the [facility] Team and is a patient of Dr. Elam Dutch.

## 2023-04-17 DIAGNOSIS — N2581 Secondary hyperparathyroidism of renal origin: Secondary | ICD-10-CM | POA: Diagnosis not present

## 2023-04-17 DIAGNOSIS — D631 Anemia in chronic kidney disease: Secondary | ICD-10-CM | POA: Diagnosis not present

## 2023-04-17 DIAGNOSIS — D509 Iron deficiency anemia, unspecified: Secondary | ICD-10-CM | POA: Diagnosis not present

## 2023-04-17 DIAGNOSIS — N186 End stage renal disease: Secondary | ICD-10-CM | POA: Diagnosis not present

## 2023-04-17 DIAGNOSIS — Z992 Dependence on renal dialysis: Secondary | ICD-10-CM | POA: Diagnosis not present

## 2023-04-19 DIAGNOSIS — D509 Iron deficiency anemia, unspecified: Secondary | ICD-10-CM | POA: Diagnosis not present

## 2023-04-19 DIAGNOSIS — Z992 Dependence on renal dialysis: Secondary | ICD-10-CM | POA: Diagnosis not present

## 2023-04-19 DIAGNOSIS — N2581 Secondary hyperparathyroidism of renal origin: Secondary | ICD-10-CM | POA: Diagnosis not present

## 2023-04-19 DIAGNOSIS — D631 Anemia in chronic kidney disease: Secondary | ICD-10-CM | POA: Diagnosis not present

## 2023-04-19 DIAGNOSIS — N186 End stage renal disease: Secondary | ICD-10-CM | POA: Diagnosis not present

## 2023-04-21 ENCOUNTER — Ambulatory Visit (HOSPITAL_BASED_OUTPATIENT_CLINIC_OR_DEPARTMENT_OTHER): Payer: Medicare Other | Attending: Primary Care | Admitting: Internal Medicine

## 2023-04-21 DIAGNOSIS — G4733 Obstructive sleep apnea (adult) (pediatric): Secondary | ICD-10-CM | POA: Diagnosis not present

## 2023-04-22 ENCOUNTER — Encounter (HOSPITAL_BASED_OUTPATIENT_CLINIC_OR_DEPARTMENT_OTHER): Payer: Self-pay | Admitting: Family

## 2023-04-22 ENCOUNTER — Ambulatory Visit (INDEPENDENT_AMBULATORY_CARE_PROVIDER_SITE_OTHER): Admitting: Family

## 2023-04-22 VITALS — BP 120/58 | HR 54 | Ht 68.0 in | Wt 185.2 lb

## 2023-04-22 DIAGNOSIS — I2089 Other forms of angina pectoris: Secondary | ICD-10-CM | POA: Diagnosis not present

## 2023-04-22 DIAGNOSIS — I480 Paroxysmal atrial fibrillation: Secondary | ICD-10-CM

## 2023-04-22 DIAGNOSIS — D509 Iron deficiency anemia, unspecified: Secondary | ICD-10-CM | POA: Diagnosis not present

## 2023-04-22 DIAGNOSIS — I5022 Chronic systolic (congestive) heart failure: Secondary | ICD-10-CM | POA: Diagnosis not present

## 2023-04-22 DIAGNOSIS — N2581 Secondary hyperparathyroidism of renal origin: Secondary | ICD-10-CM | POA: Diagnosis not present

## 2023-04-22 DIAGNOSIS — N186 End stage renal disease: Secondary | ICD-10-CM | POA: Diagnosis not present

## 2023-04-22 DIAGNOSIS — D631 Anemia in chronic kidney disease: Secondary | ICD-10-CM | POA: Diagnosis not present

## 2023-04-22 DIAGNOSIS — Z992 Dependence on renal dialysis: Secondary | ICD-10-CM | POA: Diagnosis not present

## 2023-04-22 DIAGNOSIS — I10 Essential (primary) hypertension: Secondary | ICD-10-CM | POA: Diagnosis not present

## 2023-04-22 NOTE — Progress Notes (Signed)
 Cardiology Office Note:  .   Date:  04/22/2023  ID:  Kirk [name], DOB 09-26-1960, MRN [id number] PCP: Caesar Bookman, NP  [facility] Providers Cardiologist:  Chrystie Nose, MD    History of Present Illness: .   Kirk [name] is a 63 y.o. 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   Previous cardiac catheterization 2016 with no significant obstructive coronary disease.  He had chest pain at the time associated with dialysis.  Echocardiogram August 2018 mild LVH, LVEF 55 to 60%.  CT angiography performed January 2020 for sharp chest pain with no PE though did show bronchitis and small airway bronchiolitis.  He was treated with antibiotics.  He did have atrial fibrillation at the time though given CHA2DS2-VASc score of 1 he was not recommended for anticoagulation.  Echocardiogram 01/2018 LVEF 55% with no regional wall motion abnormalities.    Admitted 10/17-10/31/23 with new onset seizure at home 2 minutes. Keppra transitioned to Depakote but was transitioned back to keppra. 11/14/21 developed right sided gaze and left neglect with CT head no stroke, not candidate for TPA. Felt to be related to seizure activity and resolved. MRI 11/16/21 suggested rebleeding, no surgical intervention recommended. He did have PAF 11/15/21 which converted with dose of Cardizem (had not had Metoprolol in 36h due to mental status) and converted to NSR within one hour. Discharged to inpatient rehab. He was readmitted and required right middle meningeal artery embolization 11/30/21.    Blood pressure has been difficult to control.  Isosorbide previously increased.  Imdur had to be reduced due to headache.  Doxazosin increased.  Losartan transitioned to valsartan.  Seen by Dr. Rennis Golden 01/01/2023 with progressive dyspnea, chest pain, shortness of breath.  Chest pain was different than the  chest pain he had complained of previously related to dialysis.  Cardiac PET and echo ordered.  Admitted 12/17 - 01/11/2024 with acute on hypoxic respiratory failure in the setting of pneumonia.  Cardiac PET 03/05/2023 with myocardial blood flow decreased globally and in each coronary distribution suggestive of severe multivessel CAD versus microvascular disease.  LVEF 35%.  Severe coronary calcifications present on CT images.    Echocardiogram 03/18/2023 LVEF 45-50%, RV SF normal, moderately elevated PASP, aortic valve not well-visualized with mild to moderate stenosis mean gradient 18 mmHg.  Admitted 2/22 - 03/23/2023 with acute hypoxic respiratory failure in the setting of flu, asthma exacerbation.  Home BP regimen reduced due to orthostasis.  Required fosfomycin for UTI symptoms.  Commended for acute inpatient rehab discharge but preferred discharge with home health PT.  On 03/28/23 underwent angioplasty of significant 80% stenosis in outflow cephalic vein site.   Underwent LHC 04/09/2023 with angiographically minimal CAD with right dominant system, LVEDP normal at ~10-15 mmHg. Noted possibility of microvascular angina but TIMI-3 flow in microvasculature.   Presents today for follow up with his wife.  He was reassured by results of cardiac catheterization.  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  Reports intermittent chest pain at rest similar to previous that is not limiting his activity.  Reports he did have an episode of hypotension Hg the other day but did take hydralazine in the morning prior to HD.  Discussed to hold all antihypertensive agents prior to HD.  Otherwise BP at home has been at goal less than 130.  He  did complete in lab sleep study last night.  ROS: Please see the history of present illness.    All other systems reviewed and are negative.   Studies Reviewed: Marland Kitchen   EKG Interpretation Date/Time:  Monday April 22 2023 08:59:58  EDT Ventricular Rate:  54 PR Interval:  186 QRS Duration:  108 QT Interval:  472 QTC Calculation: 447 R Axis:   72  Text Interpretation: Sinus bradycardia Moderate voltage criteria for LVH, may be normal variant ( Sokolow-Lyon , Cornell product ) Confirmed by Gillian Shields ([id number]) on 04/22/2023 9:09:11 AM       Risk Assessment/Calculations:    CHA2DS2-VASc Score = 3   This indicates a 3.2% annual risk of stroke. The patient's score is based upon: CHF History: 1 HTN History: 1 Diabetes History: 0 Stroke History: 0 Vascular Disease History: 1 Age Score: 0 Gender Score: 0            Physical Exam:   VS:  BP (!) 120/58 (BP Location: Left Arm, Patient Position: Sitting, Cuff Size: Normal)   Pulse (!) 54   Ht 5\' 8"  (1.727 m)   Wt 185 lb 3.2 oz (84 kg)   SpO2 97%   BMI 28.16 kg/m    Wt Readings from Last 3 Encounters:  04/22/23 185 lb 3.2 oz (84 kg)  04/22/23 195 lb (88.5 kg)  04/09/23 188 lb (85.3 kg)    GEN: Well nourished, well developed in no acute distress NECK: No JVD; No carotid bruits CARDIAC: RRR, no murmurs, rubs, gallops RESPIRATORY:  Clear to auscultation without rales, wheezing or rhonchi  ABDOMEN: Soft, non-tender, non-distended EXTREMITIES:  No edema; No deformity   ASSESSMENT AND PLAN: .    Chest pain/microvascular angina/HLD, LDL goal less than 70- Prior LHC 2016 with no significant obstructive coronary artery disease. Cardiac PET 03/06/23 with severe coronary calcifications, myocardial blood flow reverse decreased globally suggestive of severe multivessel cAD vs microvascular disease.  Subsequent LHC 04/09/2023 angiographically minimal CAD with possibility of microvascular angina though TIMI-3 flow in the microvasculature.  Reports intermittent chest pain at rest with a stable from previous and not limiting his activities.   R groin cath site healed appropriately with no hematoma, nor discomfort. GDMT includes aspirin 81mg  daily, lopressor 50mg  BID,  pravastatin 40mg  daily.  Additional GDMT for microvascular angina includes amlodipine 10 mg daily, Imdur 60 mg daily.  Continue present regimen.  Doses could be increased in the future as needed for increased microvascular angina as BP tolerates.  Systolic heart failure - cardiac PET 03/05/23 LVEF 35-37%. Volume managed with HD. GDMT includes Hydralazine 50mg  TID, Imdur 60mg  daily, Lopressor 50mg  BID. Recommend low sodium diet <2g and <2L fluid intake per day.   HTN- BP well controlled. Continue current antihypertensive regimen amlodipine 10 mg daily, doxazosin 4 mg nightly, hydralazine 50 mg 3 times daily, Imdur 60 mg daily, Lopressor 50 mg twice daily.  Discussed need to hold hydralazine prior to dialysis to prevent hypotension during dialysis.  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  PAF - Episodes have been brief, in setting of acute illness, and self limiting. Will defer OAC as episodes have been self limited.   COPD / Anxiety / Depression - Continue to follow with PCP.         Dispo: follow up in 6 mos  Signed, Alver Sorrow, NP

## 2023-04-22 NOTE — Patient Instructions (Addendum)
 Medication Instructions:  Recommend holding Hydralazine int he morning prior to hemodialysis.  You can skip afternoon dose if your BP is low after dialysis.    Follow-Up: At [facility], you and your health needs are our priority.  As part of our continuing mission to provide you with exceptional heart care, our providers are all part of one team.  This team includes your primary Cardiologist (physician) and Advanced Practice Providers or APPs (Physician Assistants and Nurse Practitioners) who all work together to provide you with the care you need, when you need it.  Your next appointment:   6 month(s)  Provider:   K. Italy Hilty, MD    We recommend signing up for the patient portal called "MyChart".  Sign up information is provided on this After Visit Summary.  MyChart is used to connect with patients for Virtual Visits (Telemedicine).  Patients are able to view lab/test results, encounter notes, upcoming appointments, etc.  Non-urgent messages can be sent to your provider as well.   To learn more about what you can do with MyChart, go to [email or web address].

## 2023-04-23 DIAGNOSIS — N186 End stage renal disease: Secondary | ICD-10-CM | POA: Diagnosis not present

## 2023-04-23 DIAGNOSIS — Z992 Dependence on renal dialysis: Secondary | ICD-10-CM | POA: Diagnosis not present

## 2023-04-23 DIAGNOSIS — T82858A Stenosis of vascular prosthetic devices, implants and grafts, initial encounter: Secondary | ICD-10-CM | POA: Diagnosis not present

## 2023-04-24 DIAGNOSIS — N2581 Secondary hyperparathyroidism of renal origin: Secondary | ICD-10-CM | POA: Diagnosis not present

## 2023-04-24 DIAGNOSIS — N186 End stage renal disease: Secondary | ICD-10-CM | POA: Diagnosis not present

## 2023-04-24 DIAGNOSIS — D631 Anemia in chronic kidney disease: Secondary | ICD-10-CM | POA: Diagnosis not present

## 2023-04-24 DIAGNOSIS — Z992 Dependence on renal dialysis: Secondary | ICD-10-CM | POA: Diagnosis not present

## 2023-04-24 DIAGNOSIS — D509 Iron deficiency anemia, unspecified: Secondary | ICD-10-CM | POA: Diagnosis not present

## 2023-04-26 DIAGNOSIS — Z992 Dependence on renal dialysis: Secondary | ICD-10-CM | POA: Diagnosis not present

## 2023-04-26 DIAGNOSIS — N186 End stage renal disease: Secondary | ICD-10-CM | POA: Diagnosis not present

## 2023-04-26 DIAGNOSIS — N2581 Secondary hyperparathyroidism of renal origin: Secondary | ICD-10-CM | POA: Diagnosis not present

## 2023-04-26 DIAGNOSIS — D509 Iron deficiency anemia, unspecified: Secondary | ICD-10-CM | POA: Diagnosis not present

## 2023-04-26 DIAGNOSIS — D631 Anemia in chronic kidney disease: Secondary | ICD-10-CM | POA: Diagnosis not present

## 2023-04-28 DIAGNOSIS — G4733 Obstructive sleep apnea (adult) (pediatric): Secondary | ICD-10-CM | POA: Diagnosis not present

## 2023-04-28 NOTE — Procedures (Signed)
 [facility] [address] [place], [place] [address] Tel: [phone]   Fax: [phone]  Split Night Interpretation  Patient Name:  Kirk, [name] Date:  04/21/2023 Referring Physician:  Ames Dura MD  Indications for Polysomnography The patient is a 63 year old Male who is 5\' 7"  and weighs 190.0 lbs.  His BMI equals 29.8.  A diagnostic polysomnogram was performed to evaluate for -.OSA  After 96.0 minutes of sleep time the patient exhibited sufficient respiratory events qualifying him for a CPAP trial which was then initiated.    Medication  Trazodone   Polysomnogram Data A full night polysomnogram was performed recording the standard physiologic parameters including EEG, EOG, EMG, EKG, nasal and oral airflow.  Respiratory parameters of chest and abdominal movements are recorded with [place]-Crystal motion transducers.  Oxygen saturation was recorded by pulse oximetry.    Sleep Architecture The total recording time of the diagnostic portion of the study was 173.6 minutes.  The total sleep time was 96.0 minutes.  During the diagnostic portion of the study, the patient spent 5.2% of total sleep time in Stage N1, 94.8% in Stage N2, 0.0% in Stages N3, and 0.0% in REM.   Sleep latency was 10.6 minutes.  REM latency was - minutes.  Sleep Efficiency was 55.3%.  Wake after Sleep Onset time was 67.0 minutes.   At 12:43:50 AM the patient was placed on PAP treatment and was titrated at pressures ranging from 5* cm/H20 with supplemental oxygen at - up to 16/12/0** cm/H20 with supplemental oxygen at -.  The total recording time of the treatment portion of the study was 221.8 minutes.  The total sleep time was 135.0 minutes.  During the treatment portion of the study, the patient spent 4.4% of total sleep time in Stage N1, 95.6% in Stage N2, 0.0% in Stages N3, and 0.0% in REM.   Sleep latency was 1.0 minutes.  REM latency was - minutes.  Sleep  Efficiency was 60.9%.  Wake after Sleep Onset time was 86.0 minutes.  Respiratory Events During the diagnostic portion of the study, the polysomnogram revealed a presence of 30 obstructive, - central, and - mixed apneas resulting in an Apnea index of 18.8 events per hour.  There were 82 hypopneas (>=3% desaturation and/or arousal) resulting in an Apnea\Hypopnea Index (AHI >=3% desaturation and/or arousal) of 70.0 events per hour.  There were 69 hypopneas (>=4% desaturation) resulting in an Apnea\Hypopnea Index (AHI >=4% desaturation) of 61.9 events per hour.  There were - Respiratory Effort Related Arousals resulting in a RERA index of - events per hour. The Respiratory Disturbance Index is 70.0 events per hour.  The snore index was - events per hour.  Mean oxygen saturation was 94.1%.  The lowest oxygen saturation during sleep was 85.0%.  Time spent <=88% oxygen saturation was 5.1 minutes (3.1%).  End Tidal CO2 during sleep ranged from - to - mmHg. End Tidal CO2 was greater than 50 mmHg for - minutes and greater than 55 mmHg for - minutes.  During the treatment portion of the study, the polysomnogram revealed a presence of 4 obstructive, - central, and - mixed apneas resulting in an Apnea index of 1.8 events per hour.  There were 39 hypopneas (>=3% desaturation and/or arousal) resulting in an Apnea\Hypopnea Index (AHI >=3% desaturation and/or arousal) of 19.1 events per hour.  There were 23 hypopneas (>=4% desaturation) resulting in an Apnea\Hypopnea Index (AHI >=4% desaturation) of 12.0 events per hour.  There were - Respiratory Effort Related Arousals resulting in a RERA index of - events per hour. The Respiratory Disturbance Index is 19.1 events per hour.  The snore index was - events per hour.  Mean oxygen saturation was 94.2%.  The lowest oxygen saturation during sleep was 88.0%.  Time spent <=88% oxygen saturation was 0.4 minutes (0.2%).  Limb Activity During the diagnostic portion of the study, there  were - limb movements recorded.  Of this total, - were classified as PLMs.  Of the PLMs, - were associated with arousals.  The Limb Movement index was - per hour while the PLM index was - per hour.  During the treatment portion of the study, there were - limb movements recorded.  Of this total, - were classified as PLMs.  Of the PLMs, - were associated with arousals.  The Limb Movement index was - per hour while the PLM index was - per hour.  Cardiac Summary During the diagnostic portion of the study, the average pulse rate was 65.8 bpm.  The minimum pulse rate was 58.0 bpm while the maximum pulse rate was 79.0 bpm.  During the treatment portion of the study, the average pulse rate was 62.2 bpm.  The minimum pulse rate was 56.0 bpm while the maximum pulse rate was 72.0 bpm.   Comment: Split night CPAP titration protocol. During diagnostic phase, AHI (4%) 61.9/hr. Snoring with oxygen desaturation to a nadir of 85% and mean of 94%. CPAP titration was not effective at tolerated pressure and was changed to BIPAP titration to final pressure 16/12, PS 0 with residual AHI 0, minimum O2 saturation 94%.  Diagnosis: Obstructive sleep apnea  Recommendations: Suggest BIPAP 16/12 or autoBIPAP. Patient wore a medium Simplus full face mask with heated humidity.   This study was personally reviewed and electronically signed by: Jetty Duhamel MD Accredited Board Certified in Sleep Medicine Date/Time:      04/28/23  11:04  Split Night Report  Patient Name: Kirk, [name] Date: 04/21/2023  Date of Birth: Mar 12, 1960 Study Type: Bilevel Titration  Age: 67 year MRN #: [id number]  Sex: Male Interpreting Physician: Jetty Duhamel [id number]  Height: 5\' 7"  Referring Physician: Ames Dura MD  Weight: 190.0 lbs Recording Tech: Cherylann Parr RPSGT RST  BMI: 29.8 Scoring Tech: Cherylann Parr RPSGT RST  ESS: 4 Neck Size: 16  Mask Type Simplus Full Face Mask Final Pressure: 16/12cmH20  Mask Size: Medium  Supplemental O2: N/A   Study Overview  DIAGNOSTIC TREATMENT  Lights Off: 09:49:51 PM Lights Off: 12:43:29 AM  Lights On: 12:43:29 AM Lights On: 04:25:18 AM  Time in Bed: 173.6 min. Time in Bed: 221.8 min.  Total Sleep Time: 96.0 min. Total Sleep Time: 135.0 min.  Sleep Efficiency: 55.3% Sleep Efficiency: 60.9%  Sleep Latency: 10.6 min. Sleep Latency: 1.0 min.  REM Latency from Sleep Onset: - min. REM Latency from Sleep Onset: - min.  Wake After Sleep Onset: 67.0 min. Wake After Sleep Onset: 86.0 min.   DIAGNOSTIC TREATMENT   Count Index  Count Index  Awakenings: 17 10.6 Awakenings: 22 9.8  Arousals: - - Arousals: 4 1.8  AHI (>=3% Desat and/or Ar.): 112 70.0 AHI (>=3% Desat and/or Ar.): 43 19.1  AHI (>=4% Desat): 99 61.9 AHI (>=4% Desat): 27 12.0   Limb Movements: - - Limb Movements: - -  Snore: - - Snore: - -  Desaturations: 116 72.5 Desaturations: 46 20.4  Minimum SpO2 TST: 85.0% Minimum SpO2 TST: 88.0%    Sleep Architecture  DIAGNOSTIC TREATMENT ENTIRE NIGHT  Stages Time (mins) % Sleep Time Time (mins) % Sleep Time Time (mins) % Sleep Time  Wake 78.0  87.0  165.0   Stage N1 5.0 5.2% 6.0 4.4% 11.0 4.8%  Stage N2 91.0 94.8% 129.0 95.6% 220.0 95.2%  Stage N3 0.0 0.0% 0.0 0.0% 0.0 0.0%  REM 0.0 0.0% 0.0 0.0% 0.0 0.0%   Arousal Summary   DIAGNOSTIC TREATMENT   NREM REM TST Index NREM REM TST Index  Respiratory Ar. - - - - 2 - 2 0.9  PLM Ar. - - - - - - - -  Isolated Limb Movement Ar. - - - - - - - -  Snore Ar. - - - - - - - -  Spontaneous Ar. - - - - 2 - 2 0.9  Total Ar. - - - - 4 - 4 1.8   Respiratory Summary  DIAGNOSTIC By Sleep Stage By Body Position Total   NREM REM Supine Non-Supine   Time (min) 96.0 0.0 64.0 32.0 96.0         Obstructive Apnea 30 - 30 - 30  Mixed Apnea - - - - -  Central Apnea - - - - -  Total Apneas 30 - 30 - 30  Total Apnea Index 18.8 - 28.1 - 18.8         Hypopneas (>=3% Desat and/or Ar.) 82 - 56 26 82  AHI (>=3% Desat and/or Ar.)  70.0 - 80.6 48.8 70.0         Hypopneas (>=4% Desat) 69 - 52 17 69  AHI (>=4% Desat) 61.9 - 76.9 31.9 61.9          RERAs - - - - -  RERA Index - - - - -         RDI 70.0 - 80.6 48.8 70.0    TREATMENT By Sleep Stage By Body Position Total   NREM REM Supine Non-Supine   Time (min) 135.0 0.0 42.0 93.0 135.0         Obstructive Apnea 4 - 3 1 4   Mixed Apnea - - - - -  Central Apnea - - - - -  Total Apneas 4 - 3 1 4   Total Apnea Index 1.8 - 4.3 0.6 1.8         Hypopneas (>=3% Desat and/or Ar.) 39 - 12 27 39  AHI (>=3% Desat and/or Ar.) 19.1 - 21.4 18.1 19.1         Hypopneas (>=4% Desat) 23 - 9 14 23   AHI (>=4% Desat) 12.0 - 17.1 9.7 12.0          RERAs - - - - -  RERA Index - - - - -         RDI 19.1 - 21.4 18.1 19.1    Respiratory Event Durations   DIAGNOSTIC TREATMENT  Apnea NREM REM NREM REM  Average (seconds) 22.3 - 22.3 -  Maximum (seconds) 34.8 - 26.8 -  Hypopnea      Average (seconds) 25.1 - 28.4 -  Maximum (seconds) 50.1 - 46.7 -    Limb Movement Summary   DIAGNOSTIC TREATMENT   Count Index Count Index  Isolated Limb Movements - - - -  Periodic Limb Movements (PLMs) - - - -  Total Limb Movements - - - -  Oxygen Saturation Summary   DIAGNOSTIC TREATMENT   Wake NREM REM TST Wake NREM REM TST  Average SpO2 94.4% 93.8% - 93.8% 94.6%  94.0% - 94.0%  Minimum SpO2 82.0% 85.0% - 85.0%  88.0% 88.0% - 88.0%   Maximum SpO2 98.0% 98.0% - 98.0%  99.0% 98.0% - 98.0%    DIAGNOSTIC Oxygen Saturation Distribution  Range (%) Time in range (min) Time in range (%)   90.0 - 100.0 151.8 91.7%  80.0 - 90.0 13.8 8.3%  70.0 - 80.0 - -  60.0 - 70.0 - -  50.0 - 60.0 - -  0.0 - 50.0 - -  Time Spent <=88% SpO2  Range (%) Time in range (min) Time in range (%)  0.0 - 88.0 5.1 3.1%      Count Index  Desaturations: 116 72.5   TREATMENT Oxygen Saturation Distribution  Range (%) Time in range (min) Time in range (%)   90.0 - 100.0 208.9 98.3%  80.0 - 90.0 3.7 1.7%   70.0 - 80.0 - -  60.0 - 70.0 - -  50.0 - 60.0 - -  0.0 - 50.0 - -  Time Spent <=88% SpO2  Range (%) Time in range (min) Time in range (%)  0.0 - 88.0 0.4 0.2%      Count Index  Desaturations: 46 20.4     Cardiac Summary   DIAGNOSTIC TREATMENT   Wake NREM REM Total Wake NREM REM Total  Average Pulse Rate (BPM) 65.6 65.9 - 65.8 61.4 62.7 - 62.2  Minimum Pulse Rate (BPM) 59.0 58.0 - 58.0 57.0 56.0 - 56.0  Maximum Pulse Rate (BPM) 79.0 75.0 - 79.0 72.0 71.0 - 72.0   Pulse Rate Distribution   DIAGNOSTIC  Range (bpm) Time in range (min) Time in range (%)  0.0 - 40.0 - -  40.0 - 60.0 8.8 5.3%  60.0 - 80.0 156.8 94.7%  80.0 - 100.0 - -  100.0 - 120.0 - -  120.0 - 140.0 - -  140.0 - 200.0 - -   TREATMENT  Range (bpm) Time in range (min) Time in range (%)  0.0 - 40.0 - -  40.0 - 60.0 83.0 39.0%  60.0 - 80.0 129.7 61.0%  80.0 - 100.0 - -  100.0 - 120.0 - -  120.0 - 140.0 - -  140.0 - 200.0 - -   EtCO2 Summary - Diagnostic  Stage Min (mmHg) Average (mmHg) Max (mmHg)  Wake - - -  NREM (1+2+3) - - -  REM - - -   EtCO2 Distribution:  Range (mmHg) Time in range (min) Time in range (%)  20.0 - 40.0 - -  40.0 - 50.0 - -  50.0 - 100.0 - -  55.0 - 100.0 - -  Excluded data <20.0 & >65.0 174.0 100.0%   Titration Summary  PAP Device PAP Level O2 Level Time (min) Wake (min) NREM (min) REM (min) Sleep Eff% OA# CA# MA# Hyp# (>=3%) AHI (>=3%) Hyp# (>=4%) AHI (>=%4) RERA RDI OSat <=88% (min) Min [employer] Ar. Index  - Off - 174.0 78.0 96.0 0.0 55.2% 30 - - 82 70.0 69  61.9 -  70.0  4.0 85.0 93.8 -  CPAP 5 - 12.0 8.5 3.5 0.0 29.2% - - - 4 68.6 4  68.6 -  68.6  0.1 88.0 92.7 -  CPAP 7 - 24.5 16.5 8.0 0.0 32.7% - - - 6 45.0 6  45.0 -  45.0  0.1 88.0 92.3 7.5  CPAP 9 - 76.0 17.0 59.0 0.0 77.6% - - - 10 10.2 4  4.1 -  10.2  0.0 89.0 93.8 2.0  BiLevel 10/6/0 - 37.0 16.0 21.0 0.0 56.8% 1 - - 12 37.1 3  11.4 -  37.1  0.0 91.0 94.3 2.9  BiLevel 12/8/0 - 29.5 18.5 11.0 0.0  37.3% 3 - - 5 43.6 5  43.6 -  43.6  0.0 89.0 94.3 -  BiLevel 13/9/0 - 14.0 4.0 10.0 0.0 71.4% - - - 2 12.0 1  6.0 -  12.0  0.0 91.0 94.8 -  BiLevel 14/10/0 - 16.5 3.0 13.5 0.0 81.8% - - - - - -  - -  -  0.0 94.0 94.8 -  BiLevel 15/11/0 - 6.0 0.0 6.0 0.0 100.0% - - - - - -  - -  -  0.0 94.0 94.1 -  BiLevel 16/12/0 - 6.5 3.5 3.0 0.0 46.2% - - - - - -  - -  -  0.0 94.0 94.8 -    Hypnograms                           Technologist Comments  STUDY TYPE= SPLIT STUDY= BIPAP PRESSURE= 16/12cmH20 MASK= SIMPLUS FULL FACE MASK SIZE= MEDIUM PATIENT PRESENT TO THE LAB FOR SPLIT NIGHT STUDY. TECH EXPLAINED THE PROCESS OF HOOK UP AND WIRES. PATIENT EXPRESSED CLEAR UNDERSTANDING. TECH OBSERVED SEVERE RESPIRATORY EVENTS AS THE STDUY PROGRESSED. SPLIT WAS INITIATED. PRESSURE WAS STARTED ON [id number]. PATIENT COULD NOT TOLERATE CPAP. PATIENT WAS SWITCHED TO BIPAP. PRESSURE WAS INCREASED ACCORDINGLY. OPTIMAL PRESSURE WAS REACHED AT BPAP 16/12CMH2O. MOST EVENTS WERE ELIMINATED ON THIS PRESSURE. SIMPLUS FULL FACE MASK WAS USED FOR THIS STUDY. PATIENT TOLERATED THE MASK. BATHROOM BREAK WAS TAKING TWICE. PATIENT SLEPT LEFT, RIGHT AND SUPINE.ECG APPEARED TO BE NORMAL SINUS RHYTHM. PATIENT REPORTED TAKING TRAZODONE 10MG  AT BED TIME -Tech                            Hugh Garrow Imagene Riches, Biomedical engineer of Sleep Medicine  ELECTRONICALLY SIGNED ON:  04/28/2023, 10:53 AM [facility] PH: [phone]   FX: [phone] ACCREDITED BY THE AMERICAN ACADEMY OF SLEEP MEDICINE

## 2023-04-29 DIAGNOSIS — N2581 Secondary hyperparathyroidism of renal origin: Secondary | ICD-10-CM | POA: Diagnosis not present

## 2023-04-29 DIAGNOSIS — N186 End stage renal disease: Secondary | ICD-10-CM | POA: Diagnosis not present

## 2023-04-29 DIAGNOSIS — D631 Anemia in chronic kidney disease: Secondary | ICD-10-CM | POA: Diagnosis not present

## 2023-04-29 DIAGNOSIS — Z992 Dependence on renal dialysis: Secondary | ICD-10-CM | POA: Diagnosis not present

## 2023-04-29 DIAGNOSIS — D509 Iron deficiency anemia, unspecified: Secondary | ICD-10-CM | POA: Diagnosis not present

## 2023-04-30 DIAGNOSIS — N3020 Other chronic cystitis without hematuria: Secondary | ICD-10-CM | POA: Diagnosis not present

## 2023-04-30 DIAGNOSIS — N3941 Urge incontinence: Secondary | ICD-10-CM | POA: Diagnosis not present

## 2023-05-01 DIAGNOSIS — Z992 Dependence on renal dialysis: Secondary | ICD-10-CM | POA: Diagnosis not present

## 2023-05-01 DIAGNOSIS — N2581 Secondary hyperparathyroidism of renal origin: Secondary | ICD-10-CM | POA: Diagnosis not present

## 2023-05-01 DIAGNOSIS — N186 End stage renal disease: Secondary | ICD-10-CM | POA: Diagnosis not present

## 2023-05-01 DIAGNOSIS — D631 Anemia in chronic kidney disease: Secondary | ICD-10-CM | POA: Diagnosis not present

## 2023-05-01 DIAGNOSIS — D509 Iron deficiency anemia, unspecified: Secondary | ICD-10-CM | POA: Diagnosis not present

## 2023-05-03 DIAGNOSIS — N2581 Secondary hyperparathyroidism of renal origin: Secondary | ICD-10-CM | POA: Diagnosis not present

## 2023-05-03 DIAGNOSIS — D509 Iron deficiency anemia, unspecified: Secondary | ICD-10-CM | POA: Diagnosis not present

## 2023-05-03 DIAGNOSIS — Z992 Dependence on renal dialysis: Secondary | ICD-10-CM | POA: Diagnosis not present

## 2023-05-03 DIAGNOSIS — D631 Anemia in chronic kidney disease: Secondary | ICD-10-CM | POA: Diagnosis not present

## 2023-05-03 DIAGNOSIS — N186 End stage renal disease: Secondary | ICD-10-CM | POA: Diagnosis not present

## 2023-05-06 DIAGNOSIS — N186 End stage renal disease: Secondary | ICD-10-CM | POA: Diagnosis not present

## 2023-05-06 DIAGNOSIS — N2581 Secondary hyperparathyroidism of renal origin: Secondary | ICD-10-CM | POA: Diagnosis not present

## 2023-05-06 DIAGNOSIS — D509 Iron deficiency anemia, unspecified: Secondary | ICD-10-CM | POA: Diagnosis not present

## 2023-05-06 DIAGNOSIS — D631 Anemia in chronic kidney disease: Secondary | ICD-10-CM | POA: Diagnosis not present

## 2023-05-06 DIAGNOSIS — Z992 Dependence on renal dialysis: Secondary | ICD-10-CM | POA: Diagnosis not present

## 2023-05-07 ENCOUNTER — Telehealth: Payer: Self-pay

## 2023-05-07 NOTE — Telephone Encounter (Signed)
 Split Night Study completed 04/21/23 and linked to order dated 04/24/23  Please advise on results, thank you!

## 2023-05-07 NOTE — Telephone Encounter (Signed)
 Copied from CRM [id number]. Topic: Clinical - Lab/Test Results >> May 06, 2023  4:04 PM Isabell A wrote: Reason for CRM: Spouse calling on behalf of patient, would like to discuss sleep study results.

## 2023-05-08 ENCOUNTER — Telehealth: Payer: Self-pay

## 2023-05-08 DIAGNOSIS — N2581 Secondary hyperparathyroidism of renal origin: Secondary | ICD-10-CM | POA: Diagnosis not present

## 2023-05-08 DIAGNOSIS — D631 Anemia in chronic kidney disease: Secondary | ICD-10-CM | POA: Diagnosis not present

## 2023-05-08 DIAGNOSIS — Z992 Dependence on renal dialysis: Secondary | ICD-10-CM | POA: Diagnosis not present

## 2023-05-08 DIAGNOSIS — N186 End stage renal disease: Secondary | ICD-10-CM | POA: Diagnosis not present

## 2023-05-08 DIAGNOSIS — D509 Iron deficiency anemia, unspecified: Secondary | ICD-10-CM | POA: Diagnosis not present

## 2023-05-08 NOTE — Telephone Encounter (Signed)
 Please let patient know that he did better on BIPAP than CPAP. Please place order for BIPAP 16/12. Patient wore a medium Simplus full face mask with heated humidity.

## 2023-05-08 NOTE — Telephone Encounter (Signed)
-----   Message from Antonio Baumgarten sent at 05/08/2023  8:49 AM EDT ----- CXR showed increased bibasilar opacities, favored to reflect pulmonary edema with bronchitis. Needs visit with our office first available or advise patient go to [facility]

## 2023-05-08 NOTE — Telephone Encounter (Signed)
Called pt., no answer, LMTCB.

## 2023-05-08 NOTE — Progress Notes (Signed)
 CXR showed increased bibasilar opacities, favored to reflect pulmonary edema with bronchitis. Needs visit with our office first available or advise patient go to [facility]

## 2023-05-08 NOTE — Telephone Encounter (Signed)
 ATC X1. LMTCB

## 2023-05-09 NOTE — Telephone Encounter (Signed)
 ATC X2. LMTCB. Sending Mychart message to pt then completing note per protocol.

## 2023-05-10 DIAGNOSIS — Z992 Dependence on renal dialysis: Secondary | ICD-10-CM | POA: Diagnosis not present

## 2023-05-10 DIAGNOSIS — N186 End stage renal disease: Secondary | ICD-10-CM | POA: Diagnosis not present

## 2023-05-10 DIAGNOSIS — D509 Iron deficiency anemia, unspecified: Secondary | ICD-10-CM | POA: Diagnosis not present

## 2023-05-10 DIAGNOSIS — N2581 Secondary hyperparathyroidism of renal origin: Secondary | ICD-10-CM | POA: Diagnosis not present

## 2023-05-10 DIAGNOSIS — D631 Anemia in chronic kidney disease: Secondary | ICD-10-CM | POA: Diagnosis not present

## 2023-05-13 DIAGNOSIS — D509 Iron deficiency anemia, unspecified: Secondary | ICD-10-CM | POA: Diagnosis not present

## 2023-05-13 DIAGNOSIS — D631 Anemia in chronic kidney disease: Secondary | ICD-10-CM | POA: Diagnosis not present

## 2023-05-13 DIAGNOSIS — N2581 Secondary hyperparathyroidism of renal origin: Secondary | ICD-10-CM | POA: Diagnosis not present

## 2023-05-13 DIAGNOSIS — N186 End stage renal disease: Secondary | ICD-10-CM | POA: Diagnosis not present

## 2023-05-13 DIAGNOSIS — Z992 Dependence on renal dialysis: Secondary | ICD-10-CM | POA: Diagnosis not present

## 2023-05-15 DIAGNOSIS — N2581 Secondary hyperparathyroidism of renal origin: Secondary | ICD-10-CM | POA: Diagnosis not present

## 2023-05-15 DIAGNOSIS — D631 Anemia in chronic kidney disease: Secondary | ICD-10-CM | POA: Diagnosis not present

## 2023-05-15 DIAGNOSIS — Z992 Dependence on renal dialysis: Secondary | ICD-10-CM | POA: Diagnosis not present

## 2023-05-15 DIAGNOSIS — N186 End stage renal disease: Secondary | ICD-10-CM | POA: Diagnosis not present

## 2023-05-15 DIAGNOSIS — D509 Iron deficiency anemia, unspecified: Secondary | ICD-10-CM | POA: Diagnosis not present

## 2023-05-17 DIAGNOSIS — D509 Iron deficiency anemia, unspecified: Secondary | ICD-10-CM | POA: Diagnosis not present

## 2023-05-17 DIAGNOSIS — N2581 Secondary hyperparathyroidism of renal origin: Secondary | ICD-10-CM | POA: Diagnosis not present

## 2023-05-17 DIAGNOSIS — D631 Anemia in chronic kidney disease: Secondary | ICD-10-CM | POA: Diagnosis not present

## 2023-05-17 DIAGNOSIS — Z992 Dependence on renal dialysis: Secondary | ICD-10-CM | POA: Diagnosis not present

## 2023-05-17 DIAGNOSIS — N186 End stage renal disease: Secondary | ICD-10-CM | POA: Diagnosis not present

## 2023-05-20 DIAGNOSIS — Z992 Dependence on renal dialysis: Secondary | ICD-10-CM | POA: Diagnosis not present

## 2023-05-20 DIAGNOSIS — N186 End stage renal disease: Secondary | ICD-10-CM | POA: Diagnosis not present

## 2023-05-20 DIAGNOSIS — D509 Iron deficiency anemia, unspecified: Secondary | ICD-10-CM | POA: Diagnosis not present

## 2023-05-20 DIAGNOSIS — N2581 Secondary hyperparathyroidism of renal origin: Secondary | ICD-10-CM | POA: Diagnosis not present

## 2023-05-20 DIAGNOSIS — D631 Anemia in chronic kidney disease: Secondary | ICD-10-CM | POA: Diagnosis not present

## 2023-05-22 DIAGNOSIS — N2581 Secondary hyperparathyroidism of renal origin: Secondary | ICD-10-CM | POA: Diagnosis not present

## 2023-05-22 DIAGNOSIS — N186 End stage renal disease: Secondary | ICD-10-CM | POA: Diagnosis not present

## 2023-05-22 DIAGNOSIS — D631 Anemia in chronic kidney disease: Secondary | ICD-10-CM | POA: Diagnosis not present

## 2023-05-22 DIAGNOSIS — D509 Iron deficiency anemia, unspecified: Secondary | ICD-10-CM | POA: Diagnosis not present

## 2023-05-22 DIAGNOSIS — Z992 Dependence on renal dialysis: Secondary | ICD-10-CM | POA: Diagnosis not present

## 2023-05-23 DIAGNOSIS — T8612 Kidney transplant failure: Secondary | ICD-10-CM | POA: Diagnosis not present

## 2023-05-23 DIAGNOSIS — N186 End stage renal disease: Secondary | ICD-10-CM | POA: Diagnosis not present

## 2023-05-23 DIAGNOSIS — Z992 Dependence on renal dialysis: Secondary | ICD-10-CM | POA: Diagnosis not present

## 2023-05-24 DIAGNOSIS — D509 Iron deficiency anemia, unspecified: Secondary | ICD-10-CM | POA: Diagnosis not present

## 2023-05-24 DIAGNOSIS — N186 End stage renal disease: Secondary | ICD-10-CM | POA: Diagnosis not present

## 2023-05-24 DIAGNOSIS — Z992 Dependence on renal dialysis: Secondary | ICD-10-CM | POA: Diagnosis not present

## 2023-05-24 DIAGNOSIS — N2581 Secondary hyperparathyroidism of renal origin: Secondary | ICD-10-CM | POA: Diagnosis not present

## 2023-05-24 DIAGNOSIS — D631 Anemia in chronic kidney disease: Secondary | ICD-10-CM | POA: Diagnosis not present

## 2023-05-27 DIAGNOSIS — Z992 Dependence on renal dialysis: Secondary | ICD-10-CM | POA: Diagnosis not present

## 2023-05-27 DIAGNOSIS — D509 Iron deficiency anemia, unspecified: Secondary | ICD-10-CM | POA: Diagnosis not present

## 2023-05-27 DIAGNOSIS — N2581 Secondary hyperparathyroidism of renal origin: Secondary | ICD-10-CM | POA: Diagnosis not present

## 2023-05-27 DIAGNOSIS — D631 Anemia in chronic kidney disease: Secondary | ICD-10-CM | POA: Diagnosis not present

## 2023-05-27 DIAGNOSIS — N186 End stage renal disease: Secondary | ICD-10-CM | POA: Diagnosis not present

## 2023-05-29 DIAGNOSIS — N186 End stage renal disease: Secondary | ICD-10-CM | POA: Diagnosis not present

## 2023-05-29 DIAGNOSIS — N2581 Secondary hyperparathyroidism of renal origin: Secondary | ICD-10-CM | POA: Diagnosis not present

## 2023-05-29 DIAGNOSIS — D509 Iron deficiency anemia, unspecified: Secondary | ICD-10-CM | POA: Diagnosis not present

## 2023-05-29 DIAGNOSIS — D631 Anemia in chronic kidney disease: Secondary | ICD-10-CM | POA: Diagnosis not present

## 2023-05-29 DIAGNOSIS — Z992 Dependence on renal dialysis: Secondary | ICD-10-CM | POA: Diagnosis not present

## 2023-05-31 ENCOUNTER — Telehealth: Payer: Self-pay | Admitting: Internal Medicine

## 2023-05-31 DIAGNOSIS — Z992 Dependence on renal dialysis: Secondary | ICD-10-CM | POA: Diagnosis not present

## 2023-05-31 DIAGNOSIS — N2581 Secondary hyperparathyroidism of renal origin: Secondary | ICD-10-CM | POA: Diagnosis not present

## 2023-05-31 DIAGNOSIS — D631 Anemia in chronic kidney disease: Secondary | ICD-10-CM | POA: Diagnosis not present

## 2023-05-31 DIAGNOSIS — N186 End stage renal disease: Secondary | ICD-10-CM | POA: Diagnosis not present

## 2023-05-31 DIAGNOSIS — D509 Iron deficiency anemia, unspecified: Secondary | ICD-10-CM | POA: Diagnosis not present

## 2023-05-31 MED ORDER — DOXAZOSIN MESYLATE 4 MG PO TABS: 4.0000 mg | ORAL_TABLET | Freq: Every day | ORAL | 3 refills | Status: DC

## 2023-05-31 MED ORDER — HYDRALAZINE HCL 50 MG PO TABS: 50.0000 mg | ORAL_TABLET | Freq: Three times a day (TID) | ORAL | 3 refills | Status: DC

## 2023-05-31 MED ORDER — METOPROLOL TARTRATE 50 MG PO TABS: 50.0000 mg | ORAL_TABLET | Freq: Two times a day (BID) | ORAL | 3 refills | Status: DC

## 2023-05-31 NOTE — Telephone Encounter (Signed)
 Left voicemail to return call to office

## 2023-05-31 NOTE — Telephone Encounter (Signed)
*  STAT* If patient is at the pharmacy, call can be transferred to refill team.   1. Which medications need to be refilled? (please list name of each medication and dose if known)  doxazosin  (CARDURA ) 4 MG tablet  hydrALAZINE  (APRESOLINE ) 50 MG tablet  metoprolol  tartrate (LOPRESSOR ) 50 MG table   Pt would also would like to know if he can take losartan  to bring bp down since he has been on antibiotic for the last 3 weeks   2. Would you like to learn more about the convenience, safety, & potential cost savings by using the [facility]?      3. Are you open to using the [facility] (Type [facility]   4. Which pharmacy/location (including street and city if local pharmacy) is medication to be sent to?   [facility]    5. Do they need a 30 day or 90 day supply? 90

## 2023-05-31 NOTE — Telephone Encounter (Signed)
 Pt's medications were sent to pt's pharmacy as requested. Confirmation received. Called pt's wife and the wife stated that pt has been taking an antibiotic and blood pressure has been running high and it was suggested at dialysis that pt start taking losartan  to bring it down. Pt's wife would like a call back concerning this matter. Please address

## 2023-06-03 DIAGNOSIS — D509 Iron deficiency anemia, unspecified: Secondary | ICD-10-CM | POA: Diagnosis not present

## 2023-06-03 DIAGNOSIS — D631 Anemia in chronic kidney disease: Secondary | ICD-10-CM | POA: Diagnosis not present

## 2023-06-03 DIAGNOSIS — N2581 Secondary hyperparathyroidism of renal origin: Secondary | ICD-10-CM | POA: Diagnosis not present

## 2023-06-03 DIAGNOSIS — Z992 Dependence on renal dialysis: Secondary | ICD-10-CM | POA: Diagnosis not present

## 2023-06-03 DIAGNOSIS — N186 End stage renal disease: Secondary | ICD-10-CM | POA: Diagnosis not present

## 2023-06-04 NOTE — Telephone Encounter (Signed)
Left message and call back number.

## 2023-06-05 DIAGNOSIS — Z992 Dependence on renal dialysis: Secondary | ICD-10-CM | POA: Diagnosis not present

## 2023-06-05 DIAGNOSIS — D631 Anemia in chronic kidney disease: Secondary | ICD-10-CM | POA: Diagnosis not present

## 2023-06-05 DIAGNOSIS — N2581 Secondary hyperparathyroidism of renal origin: Secondary | ICD-10-CM | POA: Diagnosis not present

## 2023-06-05 DIAGNOSIS — N186 End stage renal disease: Secondary | ICD-10-CM | POA: Diagnosis not present

## 2023-06-05 DIAGNOSIS — D509 Iron deficiency anemia, unspecified: Secondary | ICD-10-CM | POA: Diagnosis not present

## 2023-06-05 NOTE — Telephone Encounter (Signed)
 Left message to call back  3rd outreach attempt Encounter will be closed    Hilty, Aviva Lemmings, MD    Would not recommend losartan  for patients on dialysis. Could use extra hydralazine  if needed for hypertension.  Dr. Marvina Slough

## 2023-06-07 DIAGNOSIS — N186 End stage renal disease: Secondary | ICD-10-CM | POA: Diagnosis not present

## 2023-06-07 DIAGNOSIS — D509 Iron deficiency anemia, unspecified: Secondary | ICD-10-CM | POA: Diagnosis not present

## 2023-06-07 DIAGNOSIS — D631 Anemia in chronic kidney disease: Secondary | ICD-10-CM | POA: Diagnosis not present

## 2023-06-07 DIAGNOSIS — Z992 Dependence on renal dialysis: Secondary | ICD-10-CM | POA: Diagnosis not present

## 2023-06-07 DIAGNOSIS — N2581 Secondary hyperparathyroidism of renal origin: Secondary | ICD-10-CM | POA: Diagnosis not present

## 2023-06-10 DIAGNOSIS — N2581 Secondary hyperparathyroidism of renal origin: Secondary | ICD-10-CM | POA: Diagnosis not present

## 2023-06-10 DIAGNOSIS — D631 Anemia in chronic kidney disease: Secondary | ICD-10-CM | POA: Diagnosis not present

## 2023-06-10 DIAGNOSIS — D509 Iron deficiency anemia, unspecified: Secondary | ICD-10-CM | POA: Diagnosis not present

## 2023-06-10 DIAGNOSIS — Z992 Dependence on renal dialysis: Secondary | ICD-10-CM | POA: Diagnosis not present

## 2023-06-10 DIAGNOSIS — N186 End stage renal disease: Secondary | ICD-10-CM | POA: Diagnosis not present

## 2023-06-12 DIAGNOSIS — Z992 Dependence on renal dialysis: Secondary | ICD-10-CM | POA: Diagnosis not present

## 2023-06-12 DIAGNOSIS — N2581 Secondary hyperparathyroidism of renal origin: Secondary | ICD-10-CM | POA: Diagnosis not present

## 2023-06-12 DIAGNOSIS — D509 Iron deficiency anemia, unspecified: Secondary | ICD-10-CM | POA: Diagnosis not present

## 2023-06-12 DIAGNOSIS — D631 Anemia in chronic kidney disease: Secondary | ICD-10-CM | POA: Diagnosis not present

## 2023-06-12 DIAGNOSIS — N186 End stage renal disease: Secondary | ICD-10-CM | POA: Diagnosis not present

## 2023-06-14 DIAGNOSIS — N2581 Secondary hyperparathyroidism of renal origin: Secondary | ICD-10-CM | POA: Diagnosis not present

## 2023-06-14 DIAGNOSIS — Z992 Dependence on renal dialysis: Secondary | ICD-10-CM | POA: Diagnosis not present

## 2023-06-14 DIAGNOSIS — D631 Anemia in chronic kidney disease: Secondary | ICD-10-CM | POA: Diagnosis not present

## 2023-06-14 DIAGNOSIS — N186 End stage renal disease: Secondary | ICD-10-CM | POA: Diagnosis not present

## 2023-06-14 DIAGNOSIS — D509 Iron deficiency anemia, unspecified: Secondary | ICD-10-CM | POA: Diagnosis not present

## 2023-06-17 DIAGNOSIS — Z992 Dependence on renal dialysis: Secondary | ICD-10-CM | POA: Diagnosis not present

## 2023-06-17 DIAGNOSIS — D509 Iron deficiency anemia, unspecified: Secondary | ICD-10-CM | POA: Diagnosis not present

## 2023-06-17 DIAGNOSIS — N186 End stage renal disease: Secondary | ICD-10-CM | POA: Diagnosis not present

## 2023-06-17 DIAGNOSIS — D631 Anemia in chronic kidney disease: Secondary | ICD-10-CM | POA: Diagnosis not present

## 2023-06-17 DIAGNOSIS — N2581 Secondary hyperparathyroidism of renal origin: Secondary | ICD-10-CM | POA: Diagnosis not present

## 2023-06-19 ENCOUNTER — Inpatient Hospital Stay (HOSPITAL_COMMUNITY)
Admission: EM | Admit: 2023-06-19 | Discharge: 2023-06-25 | DRG: 193 | Disposition: A | Discharge status: Home / Self Care | Attending: Internal Medicine | Admitting: Internal Medicine

## 2023-06-19 ENCOUNTER — Emergency Department (HOSPITAL_COMMUNITY)

## 2023-06-19 ENCOUNTER — Encounter (HOSPITAL_COMMUNITY): Payer: Self-pay | Admitting: Emergency Medicine

## 2023-06-19 ENCOUNTER — Other Ambulatory Visit: Payer: Self-pay

## 2023-06-19 DIAGNOSIS — N2581 Secondary hyperparathyroidism of renal origin: Secondary | ICD-10-CM | POA: Diagnosis present

## 2023-06-19 DIAGNOSIS — T8612 Kidney transplant failure: Secondary | ICD-10-CM | POA: Diagnosis not present

## 2023-06-19 DIAGNOSIS — J810 Acute pulmonary edema: Secondary | ICD-10-CM | POA: Diagnosis not present

## 2023-06-19 DIAGNOSIS — K921 Melena: Secondary | ICD-10-CM

## 2023-06-19 DIAGNOSIS — F411 Generalized anxiety disorder: Secondary | ICD-10-CM | POA: Diagnosis present

## 2023-06-19 DIAGNOSIS — J441 Chronic obstructive pulmonary disease with (acute) exacerbation: Secondary | ICD-10-CM | POA: Diagnosis present

## 2023-06-19 DIAGNOSIS — J440 Chronic obstructive pulmonary disease with acute lower respiratory infection: Secondary | ICD-10-CM | POA: Diagnosis not present

## 2023-06-19 DIAGNOSIS — B9789 Other viral agents as the cause of diseases classified elsewhere: Secondary | ICD-10-CM | POA: Diagnosis not present

## 2023-06-19 DIAGNOSIS — C50919 Malignant neoplasm of unspecified site of unspecified female breast: Secondary | ICD-10-CM | POA: Diagnosis present

## 2023-06-19 DIAGNOSIS — Q8781 Alport syndrome: Secondary | ICD-10-CM

## 2023-06-19 DIAGNOSIS — E669 Obesity, unspecified: Secondary | ICD-10-CM | POA: Diagnosis present

## 2023-06-19 DIAGNOSIS — I120 Hypertensive chronic kidney disease with stage 5 chronic kidney disease or end stage renal disease: Secondary | ICD-10-CM | POA: Diagnosis not present

## 2023-06-19 DIAGNOSIS — Z1152 Encounter for screening for COVID-19: Secondary | ICD-10-CM | POA: Diagnosis not present

## 2023-06-19 DIAGNOSIS — D631 Anemia in chronic kidney disease: Secondary | ICD-10-CM | POA: Diagnosis present

## 2023-06-19 DIAGNOSIS — N400 Enlarged prostate without lower urinary tract symptoms: Secondary | ICD-10-CM | POA: Diagnosis present

## 2023-06-19 DIAGNOSIS — Z885 Allergy status to narcotic agent status: Secondary | ICD-10-CM

## 2023-06-19 DIAGNOSIS — D61818 Other pancytopenia: Secondary | ICD-10-CM | POA: Diagnosis present

## 2023-06-19 DIAGNOSIS — R0603 Acute respiratory distress: Secondary | ICD-10-CM | POA: Diagnosis present

## 2023-06-19 DIAGNOSIS — Z7951 Long term (current) use of inhaled steroids: Secondary | ICD-10-CM

## 2023-06-19 DIAGNOSIS — I5022 Chronic systolic (congestive) heart failure: Secondary | ICD-10-CM | POA: Diagnosis present

## 2023-06-19 DIAGNOSIS — D649 Anemia, unspecified: Secondary | ICD-10-CM | POA: Diagnosis not present

## 2023-06-19 DIAGNOSIS — G40209 Localization-related (focal) (partial) symptomatic epilepsy and epileptic syndromes with complex partial seizures, not intractable, without status epilepticus: Secondary | ICD-10-CM | POA: Diagnosis present

## 2023-06-19 DIAGNOSIS — Z992 Dependence on renal dialysis: Secondary | ICD-10-CM | POA: Diagnosis not present

## 2023-06-19 DIAGNOSIS — R0989 Other specified symptoms and signs involving the circulatory and respiratory systems: Secondary | ICD-10-CM | POA: Diagnosis not present

## 2023-06-19 DIAGNOSIS — I132 Hypertensive heart and chronic kidney disease with heart failure and with stage 5 chronic kidney disease, or end stage renal disease: Secondary | ICD-10-CM | POA: Diagnosis present

## 2023-06-19 DIAGNOSIS — I10 Essential (primary) hypertension: Secondary | ICD-10-CM | POA: Diagnosis not present

## 2023-06-19 DIAGNOSIS — J449 Chronic obstructive pulmonary disease, unspecified: Secondary | ICD-10-CM | POA: Diagnosis present

## 2023-06-19 DIAGNOSIS — R1310 Dysphagia, unspecified: Secondary | ICD-10-CM | POA: Diagnosis present

## 2023-06-19 DIAGNOSIS — J129 Viral pneumonia, unspecified: Principal | ICD-10-CM | POA: Diagnosis present

## 2023-06-19 DIAGNOSIS — Z853 Personal history of malignant neoplasm of breast: Secondary | ICD-10-CM

## 2023-06-19 DIAGNOSIS — Z905 Acquired absence of kidney: Secondary | ICD-10-CM

## 2023-06-19 DIAGNOSIS — R059 Cough, unspecified: Secondary | ICD-10-CM | POA: Diagnosis not present

## 2023-06-19 DIAGNOSIS — I517 Cardiomegaly: Secondary | ICD-10-CM | POA: Diagnosis not present

## 2023-06-19 DIAGNOSIS — J8283 Eosinophilic asthma: Secondary | ICD-10-CM | POA: Diagnosis not present

## 2023-06-19 DIAGNOSIS — I5023 Acute on chronic systolic (congestive) heart failure: Secondary | ICD-10-CM | POA: Diagnosis present

## 2023-06-19 DIAGNOSIS — D509 Iron deficiency anemia, unspecified: Secondary | ICD-10-CM | POA: Diagnosis not present

## 2023-06-19 DIAGNOSIS — Y830 Surgical operation with transplant of whole organ as the cause of abnormal reaction of the patient, or of later complication, without mention of misadventure at the time of the procedure: Secondary | ICD-10-CM | POA: Diagnosis present

## 2023-06-19 DIAGNOSIS — Z9049 Acquired absence of other specified parts of digestive tract: Secondary | ICD-10-CM

## 2023-06-19 DIAGNOSIS — D500 Iron deficiency anemia secondary to blood loss (chronic): Secondary | ICD-10-CM

## 2023-06-19 DIAGNOSIS — J4531 Mild persistent asthma with (acute) exacerbation: Secondary | ICD-10-CM | POA: Diagnosis not present

## 2023-06-19 DIAGNOSIS — E876 Hypokalemia: Secondary | ICD-10-CM | POA: Diagnosis not present

## 2023-06-19 DIAGNOSIS — E785 Hyperlipidemia, unspecified: Secondary | ICD-10-CM | POA: Diagnosis present

## 2023-06-19 DIAGNOSIS — Z7982 Long term (current) use of aspirin: Secondary | ICD-10-CM

## 2023-06-19 DIAGNOSIS — F1190 Opioid use, unspecified, uncomplicated: Secondary | ICD-10-CM | POA: Diagnosis present

## 2023-06-19 DIAGNOSIS — H9190 Unspecified hearing loss, unspecified ear: Secondary | ICD-10-CM | POA: Diagnosis present

## 2023-06-19 DIAGNOSIS — Z981 Arthrodesis status: Secondary | ICD-10-CM

## 2023-06-19 DIAGNOSIS — J9601 Acute respiratory failure with hypoxia: Secondary | ICD-10-CM | POA: Diagnosis present

## 2023-06-19 DIAGNOSIS — N186 End stage renal disease: Secondary | ICD-10-CM | POA: Diagnosis present

## 2023-06-19 DIAGNOSIS — C44621 Squamous cell carcinoma of skin of unspecified upper limb, including shoulder: Secondary | ICD-10-CM | POA: Diagnosis present

## 2023-06-19 DIAGNOSIS — K769 Liver disease, unspecified: Secondary | ICD-10-CM | POA: Diagnosis not present

## 2023-06-19 DIAGNOSIS — R0602 Shortness of breath: Secondary | ICD-10-CM | POA: Diagnosis not present

## 2023-06-19 DIAGNOSIS — I480 Paroxysmal atrial fibrillation: Secondary | ICD-10-CM | POA: Diagnosis present

## 2023-06-19 DIAGNOSIS — R9389 Abnormal findings on diagnostic imaging of other specified body structures: Secondary | ICD-10-CM | POA: Diagnosis not present

## 2023-06-19 DIAGNOSIS — Z6828 Body mass index (BMI) 28.0-28.9, adult: Secondary | ICD-10-CM

## 2023-06-19 DIAGNOSIS — Z79899 Other long term (current) drug therapy: Secondary | ICD-10-CM

## 2023-06-19 DIAGNOSIS — G4733 Obstructive sleep apnea (adult) (pediatric): Secondary | ICD-10-CM

## 2023-06-19 DIAGNOSIS — G609 Hereditary and idiopathic neuropathy, unspecified: Secondary | ICD-10-CM | POA: Diagnosis present

## 2023-06-19 DIAGNOSIS — Z87891 Personal history of nicotine dependence: Secondary | ICD-10-CM

## 2023-06-19 DIAGNOSIS — E8779 Other fluid overload: Secondary | ICD-10-CM | POA: Diagnosis not present

## 2023-06-19 DIAGNOSIS — G8929 Other chronic pain: Secondary | ICD-10-CM | POA: Diagnosis present

## 2023-06-19 DIAGNOSIS — E1122 Type 2 diabetes mellitus with diabetic chronic kidney disease: Secondary | ICD-10-CM | POA: Diagnosis not present

## 2023-06-19 DIAGNOSIS — M109 Gout, unspecified: Secondary | ICD-10-CM | POA: Diagnosis present

## 2023-06-19 DIAGNOSIS — E1121 Type 2 diabetes mellitus with diabetic nephropathy: Secondary | ICD-10-CM | POA: Diagnosis present

## 2023-06-19 DIAGNOSIS — K219 Gastro-esophageal reflux disease without esophagitis: Secondary | ICD-10-CM | POA: Diagnosis present

## 2023-06-19 HISTORY — DX: Kidney transplant status: Z94.0

## 2023-06-19 LAB — RESP PANEL BY RT-PCR (RSV, FLU A&B, COVID)  RVPGX2
Influenza A by PCR: NEGATIVE
Influenza B by PCR: NEGATIVE
Resp Syncytial Virus by PCR: NEGATIVE
SARS Coronavirus 2 by RT PCR: NEGATIVE

## 2023-06-19 LAB — CBC WITH DIFFERENTIAL/PLATELET
Abs Immature Granulocytes: 0.01 10*3/uL (ref 0.00–0.07)
Basophils Absolute: 0 10*3/uL (ref 0.0–0.1)
Basophils Relative: 1 %
Eosinophils Absolute: 0.1 10*3/uL (ref 0.0–0.5)
Eosinophils Relative: 2 %
HCT: 21.4 % — ABNORMAL LOW (ref 39.0–52.0)
Hemoglobin: 6.8 g/dL — CL (ref 13.0–17.0)
Immature Granulocytes: 0 %
Lymphocytes Relative: 22 %
Lymphs Abs: 0.5 10*3/uL — ABNORMAL LOW (ref 0.7–4.0)
MCH: 31.5 pg (ref 26.0–34.0)
MCHC: 31.3 g/dL (ref 30.0–36.0)
MCV: 100.5 fL — ABNORMAL HIGH (ref 80.0–100.0)
Monocytes Absolute: 0.3 10*3/uL (ref 0.1–1.0)
Monocytes Relative: 13 %
Neutro Abs: 1.4 10*3/uL — ABNORMAL LOW (ref 1.7–7.7)
Neutrophils Relative %: 62 %
Platelets: 74 10*3/uL — ABNORMAL LOW (ref 150–400)
RBC: 2.13 MIL/uL — ABNORMAL LOW (ref 4.22–5.81)
RDW: 14.2 % (ref 11.5–15.5)
WBC: 2.3 10*3/uL — ABNORMAL LOW (ref 4.0–10.5)
nRBC: 0 % (ref 0.0–0.2)

## 2023-06-19 LAB — BRAIN NATRIURETIC PEPTIDE: B Natriuretic Peptide: 1727 pg/mL — ABNORMAL HIGH (ref 0.0–100.0)

## 2023-06-19 LAB — BASIC METABOLIC PANEL WITH GFR
Anion gap: 11 (ref 5–15)
BUN: 10 mg/dL (ref 8–23)
CO2: 31 mmol/L (ref 22–32)
Calcium: 8.1 mg/dL — ABNORMAL LOW (ref 8.9–10.3)
Chloride: 94 mmol/L — ABNORMAL LOW (ref 98–111)
Creatinine, Ser: 4.05 mg/dL — ABNORMAL HIGH (ref 0.61–1.24)
GFR, Estimated: 16 mL/min — ABNORMAL LOW (ref >60)
Glucose, Bld: 110 mg/dL — ABNORMAL HIGH (ref 70–99)
Potassium: 2.8 mmol/L — ABNORMAL LOW (ref 3.5–5.1)
Sodium: 136 mmol/L (ref 135–145)

## 2023-06-19 LAB — RESPIRATORY PANEL BY PCR

## 2023-06-19 LAB — PREPARE RBC (CROSSMATCH)

## 2023-06-19 LAB — TROPONIN I (HIGH SENSITIVITY)
Troponin I (High Sensitivity): 63 ng/L — ABNORMAL HIGH (ref <18)
Troponin I (High Sensitivity): 64 ng/L — ABNORMAL HIGH (ref <18)

## 2023-06-19 LAB — HEPATITIS B SURFACE ANTIGEN: Hepatitis B Surface Ag: NONREACTIVE

## 2023-06-19 MED ORDER — IPRATROPIUM-ALBUTEROL 0.5-2.5 (3) MG/3ML IN SOLN
RESPIRATORY_TRACT | Status: AC
  Administered 2023-06-19: 3 mL via RESPIRATORY_TRACT
  Filled 2023-06-19: qty 3

## 2023-06-19 MED ORDER — IPRATROPIUM-ALBUTEROL 0.5-2.5 (3) MG/3ML IN SOLN
3.0000 mL | Freq: Once | RESPIRATORY_TRACT | Status: AC
  Administered 2023-06-19: 3 mL via RESPIRATORY_TRACT
  Filled 2023-06-19: qty 3

## 2023-06-19 MED ORDER — PANTOPRAZOLE SODIUM 40 MG IV SOLR
40.0000 mg | Freq: Two times a day (BID) | INTRAVENOUS | Status: DC
  Administered 2023-06-19 – 2023-06-25 (×11): 40 mg via INTRAVENOUS
  Filled 2023-06-19 (×11): qty 10

## 2023-06-19 MED ORDER — SODIUM CHLORIDE 0.9% IV SOLUTION: Freq: Once | INTRAVENOUS | Status: AC

## 2023-06-19 MED ORDER — SODIUM CHLORIDE 0.9% FLUSH
3.0000 mL | Freq: Two times a day (BID) | INTRAVENOUS | Status: DC
  Administered 2023-06-20 – 2023-06-25 (×11): 3 mL via INTRAVENOUS

## 2023-06-19 MED ORDER — ISOSORBIDE MONONITRATE ER 60 MG PO TB24
60.0000 mg | ORAL_TABLET | Freq: Every morning | ORAL | Status: DC
  Administered 2023-06-20 – 2023-06-25 (×4): 60 mg via ORAL
  Filled 2023-06-19: qty 1
  Filled 2023-06-19: qty 2
  Filled 2023-06-19 (×5): qty 1

## 2023-06-19 MED ORDER — DULOXETINE HCL 60 MG PO CPEP
60.0000 mg | ORAL_CAPSULE | Freq: Every morning | ORAL | Status: DC
  Administered 2023-06-20 – 2023-06-25 (×5): 60 mg via ORAL
  Filled 2023-06-19 (×5): qty 1
  Filled 2023-06-19: qty 2

## 2023-06-19 MED ORDER — SODIUM CHLORIDE 0.9% FLUSH: 3.0000 mL | INTRAVENOUS | Status: DC | PRN

## 2023-06-19 MED ORDER — IPRATROPIUM-ALBUTEROL 0.5-2.5 (3) MG/3ML IN SOLN
3.0000 mL | Freq: Four times a day (QID) | RESPIRATORY_TRACT | Status: DC
  Administered 2023-06-20 – 2023-06-25 (×20): 3 mL via RESPIRATORY_TRACT
  Filled 2023-06-19 (×23): qty 3

## 2023-06-19 MED ORDER — SODIUM CHLORIDE 0.9 % IV SOLN: 250.0000 mL | INTRAVENOUS | Status: AC | PRN

## 2023-06-19 MED ORDER — METHYLPREDNISOLONE SODIUM SUCC 40 MG IJ SOLR
40.0000 mg | Freq: Two times a day (BID) | INTRAMUSCULAR | Status: DC
  Administered 2023-06-19 – 2023-06-22 (×6): 40 mg via INTRAVENOUS
  Filled 2023-06-19 (×6): qty 1

## 2023-06-19 MED ORDER — CHLORHEXIDINE GLUCONATE CLOTH 2 % EX PADS
6.0000 | MEDICATED_PAD | Freq: Every day | CUTANEOUS | Status: DC
  Administered 2023-06-21 – 2023-06-25 (×4): 6 via TOPICAL

## 2023-06-19 MED ORDER — DOXAZOSIN MESYLATE 4 MG PO TABS
4.0000 mg | ORAL_TABLET | Freq: Every day | ORAL | Status: DC
  Administered 2023-06-19 – 2023-06-25 (×6): 4 mg via ORAL
  Filled 2023-06-19 (×7): qty 1

## 2023-06-19 MED ORDER — METOPROLOL TARTRATE 50 MG PO TABS
50.0000 mg | ORAL_TABLET | Freq: Two times a day (BID) | ORAL | Status: DC
  Administered 2023-06-19 – 2023-06-25 (×11): 50 mg via ORAL
  Filled 2023-06-19 (×2): qty 1
  Filled 2023-06-19: qty 2
  Filled 2023-06-19 (×6): qty 1
  Filled 2023-06-19: qty 2
  Filled 2023-06-19: qty 1

## 2023-06-19 MED ORDER — AMLODIPINE BESYLATE 10 MG PO TABS
10.0000 mg | ORAL_TABLET | Freq: Every day | ORAL | Status: DC
  Administered 2023-06-19 – 2023-06-25 (×6): 10 mg via ORAL
  Filled 2023-06-19 (×2): qty 1
  Filled 2023-06-19 (×2): qty 2
  Filled 2023-06-19 (×3): qty 1

## 2023-06-19 NOTE — Progress Notes (Signed)
 Pt agitated with BIPAP mask and requested to be taken off BIPAP. Pt shows no signs of respiratory distress, is speaking in full sentences, vital signs are stable. BIPAP on standby.

## 2023-06-19 NOTE — Progress Notes (Signed)
 Brief Nephrology Note  Patient sent from dialysis for shortness of breath. Hypoxia and increased work of breathing. Rhonchi noted by ER. Imaging read pending but likely some congestion. Anemia also present with possible GI bleed. Left HD about 1.5L over EDW and did have some UF. Some volume overload and anemia likely contributing to shortness of breath but may be multifactorial  Will do 2.5 hours of dry UF tonight and plan for transfusion with HD.  Formal consult to follow

## 2023-06-19 NOTE — H&P (Addendum)
 History and Physical    Kirk [name] BJY:[id number] DOB: 1960/03/22 DOA: 06/19/2023  PCP: Estil Heman, NP  Patient coming from: dialysis by way of home   Chief Complaint: shortness of breath  HPI: Kirk [name] is a 63 y.o. male with medical history significant for hfref, copd, esrd on [facility] dialysis, htn, osa on cpap, a fib not anticoagulated, who presents with the above.  Reports feeling ill for several days, feeling short of breath, at rest but worse with ambulation. Some cough as well, no fevers. No vomiting or diarrhea but has had a week of black stool. Is on asa, denies nsaids. At dialysis today mid-way through session complained of worsening shortness of breath so ems alerted. Arrived on bipap.   Review of Systems: As per HPI otherwise 10 point review of systems negative.    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)    Anxiety    Asthma    Asthma    moderate persistent   Carpal tunnel syndrome    Cellulitis and abscess of trunk    Cholelithiasis 07/13/2014   Chronic headaches    Cigarette smoker 07/11/2017   Stopped 2/022  - referred to start Lung cancer screening 04/2021         Debility, unspecified    Dermatophytosis of the body    Dysrhythmia    history of   Edema    End stage renal disease on dialysis [facility])    "MWF; [facility] in [place]" (10/21/2014)   Essential hypertension, benign    Generalized anxiety disorder 05/25/2022   GERD (gastroesophageal reflux disease)    Gout, unspecified    HTN (hypertension)    Hypertrophy of prostate without urinary obstruction and other lower urinary tract symptoms (LUTS)    Hypotension, unspecified    Hypoxia 03/21/2023   Impotence of organic origin    Insomnia, unspecified    Kidney replaced by transplant    Localization-related (focal) (partial) epilepsy and  epileptic syndromes with complex partial seizures, without mention of intractable epilepsy    12-15-19- Wife states he has NEVER had a seizure    Lumbago    Memory loss    OSA on CPAP    Other and unspecified hyperlipidemia    controlled /managed per wife    Other chronic nonalcoholic liver disease    Other malaise and fatigue    Other nonspecific abnormal serum enzyme levels    Pain in joint, lower leg    Pain in joint, upper arm    Pneumonia "several times"   PONV (postoperative nausea and vomiting)    Renal dialysis status(V45.11) 02/05/2010   restarted 01/02/13 ofter renal trransplant failure   Secondary hyperparathyroidism (of renal origin)    Shortness of breath    Sleep apnea    wears cpap    Tension headache    Unspecified constipation    Unspecified essential hypertension    Unspecified hereditary and idiopathic peripheral neuropathy    Unspecified vitamin D  deficiency     Past Surgical History:  Procedure Laterality Date   A/V FISTULAGRAM N/A 03/28/2023   Procedure: A/V Fistulagram;  Surgeon: Patrick Boor, MD;  Location: [facility] INVASIVE [facility];  Service: Cardiovascular;  Laterality: N/A;   AV FISTULA PLACEMENT Left ?2010   "forearm; at [facility]"  BACK SURGERY     CARDIAC CATHETERIZATION  03/21/2011   CHOLECYSTECTOMY N/A 10/21/2014   Procedure: LAPAROSCOPIC CHOLECYSTECTOMY WITH INTRAOPERATIVE CHOLANGIOGRAM;  Surgeon: Lillette Reid III, MD;  Location: [facility] OR;  Service: General;  Laterality: N/A;   COLONOSCOPY     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   IR ANGIO INTRA EXTRACRAN SEL INTERNAL CAROTID UNI R MOD SED  11/30/2021   IR ANGIOGRAM FOLLOW UP STUDY  11/30/2021   IR AV DIALY SHUNT INTRO NEEDLE/INTRACATH INITIAL W/PTA/IMG LEFT  12/13/2022   IR BONE MARROW BIOPSY & ASPIRATION  03/27/2022   IR NEURO EACH ADD'L AFTER BASIC UNI RIGHT (MS)  11/30/2021   IR TRANSCATH/EMBOLIZ  11/30/2021   IR US  GUIDE VASC ACCESS LEFT  12/13/2022   KIDNEY TRANSPLANT   08/17/2011   [facility]    LAPAROSCOPIC CHOLECYSTECTOMY  10/21/2014   w/IOC   LEFT HEART CATH AND CORONARY ANGIOGRAPHY N/A 04/09/2023   Procedure: LEFT HEART CATH AND CORONARY ANGIOGRAPHY;  Surgeon: Arleen Lacer, MD;  Location: [facility];  Service: Cardiovascular;  Laterality: N/A;   LEFT HEART CATHETERIZATION WITH CORONARY ANGIOGRAM N/A 03/21/2011   Procedure: LEFT HEART CATHETERIZATION WITH CORONARY ANGIOGRAM;  Surgeon: Hazle Lites, MD;  Location: [facility] CATH LAB;  Service: Cardiovascular;  Laterality: N/A;   NEPHRECTOMY  08/2013   removed transplaned kidney   PERIPHERAL VASCULAR BALLOON ANGIOPLASTY Left 03/28/2023   Procedure: PERIPHERAL VASCULAR BALLOON ANGIOPLASTY;  Surgeon: Patrick Boor, MD;  Location: [facility];  Service: Cardiovascular;  Laterality: Left;  80% outflow cephalic   POLYPECTOMY     POSTERIOR FUSION CERVICAL SPINE  06/25/2012   for spinal stenosis   RADIOLOGY WITH ANESTHESIA N/A 11/30/2021   Procedure: RIGHT MIDDLE MENINGEAL ARTERY EMBOLIZATION;  Surgeon: Radiologist, Medication, MD;  Location: [facility] OR;  Service: Radiology;  Laterality: N/A;   VASECTOMY  2010     reports that he quit smoking about 3 years ago. His smoking use included cigarettes. He started smoking about 32 years ago. He has a 16 pack-year smoking history. He has never used smokeless tobacco. He reports that he does not drink alcohol and does not use drugs.  Allergies  Allergen Reactions   Codeine Nausea And Vomiting   Hydrocodone -Acetaminophen  Nausea And Vomiting    Family History  Adopted: Yes  Problem Relation Age of Onset   Colon cancer Neg Hx    Esophageal cancer Neg Hx    Rectal cancer Neg Hx    Stomach cancer Neg Hx    Colon polyps Neg Hx     Prior to Admission medications   Medication Sig Start Date End Date Taking? Authorizing Provider  acetaminophen  (TYLENOL ) 500 MG tablet Take 1 tablet (500 mg total) by mouth every 6 (six) hours as needed for moderate pain. 09/12/14    Lonita Roach, MD  albuterol  (PROVENTIL ) (2.5 MG/3ML) 0.083% nebulizer solution Take 3 mLs (2.5 mg total) by nebulization every 6 (six) hours as needed for wheezing or shortness of breath. 11/13/22   Antonio Baumgarten, NP  albuterol  (VENTOLIN  HFA) 108 (90 Base) MCG/ACT inhaler Inhale 2 puffs into the lungs every 6 (six) hours as needed for wheezing or shortness of breath. 10/29/22   Diamond Formica, MD  amLODipine  (NORVASC ) 10 MG tablet TAKE 1 TABLET BY MOUTH EVERY DAY 12/05/22   Ngetich, Dinah C, NP  aspirin  81 MG chewable tablet Chew 81 mg by mouth in the morning. 09/28/20   [provider]  Cholecalciferol  (VITAMIN D3) 25 MCG (1000 UT) CAPS Take 4,000 Units by mouth daily. Take at home on Tues, Thurs, Sat, and Sun    [provider]  doxazosin  (CARDURA ) 4 MG tablet Take 1 tablet (4 mg total) by mouth at bedtime. 05/31/23   Clearnce Curia, NP  DULoxetine  (CYMBALTA ) 60 MG capsule Take 60 mg by mouth in the morning.    [provider]  fluticasone  furoate-vilanterol (BREO ELLIPTA ) 200-25 MCG/ACT AEPB Inhale 1 puff into the lungs in the morning and at bedtime. Patient not taking: Reported on 04/22/2023 03/23/23   Shitarev, Dimitry, MD  guaiFENesin  (MUCINEX ) 600 MG 12 hr tablet Take 1,200 mg by mouth 2 (two) times daily.    [provider]  hydrALAZINE  (APRESOLINE ) 50 MG tablet Take 1 tablet (50 mg total) by mouth 3 (three) times daily. 05/31/23   Walker, Caitlin S, NP  isosorbide  mononitrate (IMDUR ) 60 MG 24 hr tablet Take 60 mg by mouth in the morning. 10/04/22   [provider]  metoprolol  tartrate (LOPRESSOR ) 50 MG tablet Take 1 tablet (50 mg total) by mouth 2 (two) times daily. 05/31/23   Walker, Caitlin S, NP  multivitamin (RENA-VIT) TABS tablet Take 1 tablet by mouth daily. 01/29/23   Ngetich, Dinah C, NP  ondansetron  (ZOFRAN -ODT) 4 MG disintegrating tablet TAKE 1 TABLET BY MOUTH EVERY 8 HOURS AS NEEDED FOR NAUSEA AND VOMITING 10/02/22   Ngetich, Dinah C,  NP  pantoprazole  (PROTONIX ) 40 MG tablet Take 1 tablet (40 mg total) by mouth daily. 03/23/23   Shitarev, Dimitry, MD  pravastatin  (PRAVACHOL ) 40 MG tablet TAKE 1 TABLET BY MOUTH EVERY DAY IN THE EVENING 12/04/22   Ngetich, Dinah C, NP  traZODone  (DESYREL ) 50 MG tablet TAKE 0.5-1 TABLET (25-50 MG TOTAL) BY MOUTH AT BEDTIME AS NEEDED. FOR SLEEP 04/08/23   Ngetich, Dinah C, NP  umeclidinium bromide  (INCRUSE ELLIPTA ) 62.5 MCG/ACT AEPB Inhale 1 puff into the lungs daily. Patient not taking: Reported on 04/22/2023 03/24/23   Homer Lust, MD  VELPHORO  500 MG chewable tablet Chew 1,000 mg by mouth 3 (three) times daily with meals. 02/06/23   [provider]    Physical Exam: Vitals:   06/19/23 1601  BP: (!) 152/86  Pulse: 78  Resp: 20  Temp: 98.6 F (37 C)  TempSrc: Oral  SpO2: 100%    Constitutional: chronically ill appearing Head: Atraumatic Eyes: Conjunctiva clear ENM: Moist mucous membranes. poor dentition.  Neck: Supple Respiratory: scattered rhonchi, exp wheeze Cardiovascular: Regular rate and rhythm. N  Abdomen: Non-tender, non-distended.   Musculoskeletal: No joint deformity upper and lower extremities.   Skin: No rashes, lesions, or ulcers.  Extremities: No peripheral edema. Palpable peripheral pulses. Neurologic: Alert, moving all 4 extremities. Hard of hearing Psychiatric: calm   Labs on Admission: I have personally reviewed following labs and imaging studies  CBC: Recent Labs  Lab 06/19/23 1606  WBC 2.3*  NEUTROABS 1.4*  HGB 6.8*  HCT 21.4*  MCV 100.5*  PLT 74*   Basic Metabolic Panel: Recent Labs  Lab 06/19/23 1606  NA 136  K 2.8*  CL 94*  CO2 31  GLUCOSE 110*  BUN 10  CREATININE 4.05*  CALCIUM  8.1*   GFR: CrCl cannot be calculated (Unknown ideal weight.). Liver Function Tests: No results for input(s): "AST", "ALT", "ALKPHOS", "BILITOT", "PROT", "ALBUMIN " in the last 168 hours. No results for input(s): "LIPASE", "AMYLASE" in the last 168  hours. No results for input(s): "AMMONIA" in the last 168 hours. Coagulation  Profile: No results for input(s): "INR", "PROTIME" in the last 168 hours. Cardiac Enzymes: No results for input(s): "CKTOTAL", "CKMB", "CKMBINDEX", "TROPONINI" in the last 168 hours. BNP (last 3 results) No results for input(s): "PROBNP" in the last 8760 hours. HbA1C: No results for input(s): "HGBA1C" in the last 72 hours. CBG: No results for input(s): "GLUCAP" in the last 168 hours. Lipid Profile: No results for input(s): "CHOL", "HDL", "LDLCALC", "TRIG", "CHOLHDL", "LDLDIRECT" in the last 72 hours. Thyroid  Function Tests: No results for input(s): "TSH", "T4TOTAL", "FREET4", "T3FREE", "THYROIDAB" in the last 72 hours. Anemia Panel: No results for input(s): "VITAMINB12", "FOLATE", "FERRITIN", "TIBC", "IRON", "RETICCTPCT" in the last 72 hours. Urine analysis:    Component Value Date/Time   COLORURINE YELLOW 02/19/2017 1520   APPEARANCEUR HAZY (A) 02/19/2017 1520   LABSPEC 1.012 02/19/2017 1520   PHURINE 8.0 02/19/2017 1520   GLUCOSEU 50 (A) 02/19/2017 1520   HGBUR MODERATE (A) 02/19/2017 1520   BILIRUBINUR NEGATIVE 02/19/2017 1520   KETONESUR NEGATIVE 02/19/2017 1520   PROTEINUR >=300 (A) 02/19/2017 1520   UROBILINOGEN 0.2 04/06/2014 1030   NITRITE NEGATIVE 02/19/2017 1520   LEUKOCYTESUR MODERATE (A) 02/19/2017 1520    Radiological Exams on Admission: No results found.  EKG: Independently reviewed. Sinus rhythm  Assessment/Plan Active Problems:   OSA on CPAP   Essential hypertension   Alports syndrome   Obesity   Type 2 diabetes mellitus with diabetic nephropathy, without long-term current use of insulin  (HCC)   SCC (squamous cell carcinoma), arm   Narcotic drug use   ESRD (end stage renal disease) (HCC)   Chronic systolic heart failure (HCC)   Stage IV carcinoma of breast (HCC)   COPD (chronic obstructive pulmonary disease) (HCC)   PAF (paroxysmal atrial fibrillation) (HCC)   # Acute  respiratory failure Suspect multifactorial from anemia, volume, and copd - continue bipap - f/u vbg - f/u cxr  # Anemia # Melena Hgb 6.8 with report of 1 week melena, hgb was 10s a couple of months ago - start pantop - GI consulted Teacher, English as a foreign language), will see tomorrow - clears, npo at midnight - transfuse 1 unit with dialysis  # ESRD On [facility] hemodialysis, got partial session today - nephrology consulted, they will attempt additional dialysis tonight  # COPD with exacerbation With dyspnea and wheeze - start methylpred - duonebs - f/u cxr, respiratory panel - consider adding abx  # HFrEF Ef 35-37 on recent PET echo. Does not appear particularly volume overloaded but does have some wheeze and partial dialysis session today - nephrology will attempt additional dialysis tonight  # Hypokalemia - should correct with dialysis tonight  # HTN Here bp mildly elevated - cont home amlodipine , metoprolol , imdur , doxazosin  - home hydral on hold  # Chronic pain - home dulxetine  # T2DM Diet controlled - monitor  # OSA - cpap at bedtime when weaned off bipap  # Hx a-fib Limited to acute illness, not anticoagulated per cardiology  # Pancytopenia Follows w/ onc outpt  DVT prophylaxis: SCDs Code Status: full  Family Communication: none at bedside  Consults called: nephrology, GI   Level of care: progressive    Raymonde Calico MD [facility] Pager [phone]  If 7PM-7AM, please contact night-coverage [email or web address] Password [facility]  06/19/2023, 5:31 PM

## 2023-06-19 NOTE — ED Triage Notes (Addendum)
  BIB EMS dialysis, 3.5 hours completed, stopped 30 min early  Acute onset severe shob at dialysis. Mild Shob 3-4 days with new cough. Out of his inhaler. 100% on [place] per EMS, but speaking in 1-2 word sentences. EMS placed on CPAP. Rales, wheezing throughout, worse on R. Uses CPAP at home as needed, and wears it at night   1 duo neb  .8 nitroglycerin  total   18g R forearm    Aox4    GCS 15   162/80 Hr 90 RR 16 100% CPAP

## 2023-06-19 NOTE — ED Notes (Signed)
 Pt transported to dialysis with dialysis RN

## 2023-06-19 NOTE — ED Provider Notes (Signed)
 [place] EMERGENCY DEPARTMENT AT [facility] Provider Note   CSN: [id number] Arrival date & time: 06/19/23  1550     History  Chief Complaint  Patient presents with   Shortness of Breath    Kirk [name] is a 63 y.o. male history of ESRD on dialysis, diabetes, thrombocytopenia, CHF presented for shortness of breath that happened acutely at dialysis today.  Patient at 30 minutes left when he began endorsing shortness of breath.  Patient has had shortness of breath for the past 3 to 4 days however has not been able to use his inhaler as he does not have 1.  Shortness of breath became acute dialysis and he was placed on nasal cannula which is new but still only speaking 1-2 word sentences.  Patient does have rales throughout lung fields and active cough clear sputum.  Patient was placed on CPAP by EMS.  Patient is not on any blood thinners.  Home Medications Prior to Admission medications   Medication Sig Start Date End Date Taking? Authorizing Provider  acetaminophen  (TYLENOL ) 500 MG tablet Take 1 tablet (500 mg total) by mouth every 6 (six) hours as needed for moderate pain. 09/12/14   Lonita Roach, MD  albuterol  (PROVENTIL ) (2.5 MG/3ML) 0.083% nebulizer solution Take 3 mLs (2.5 mg total) by nebulization every 6 (six) hours as needed for wheezing or shortness of breath. 11/13/22   Antonio Baumgarten, NP  albuterol  (VENTOLIN  HFA) 108 [phone] Base) MCG/ACT inhaler Inhale 2 puffs into the lungs every 6 (six) hours as needed for wheezing or shortness of breath. 10/29/22   Diamond Formica, MD  amLODipine  (NORVASC ) 10 MG tablet TAKE 1 TABLET BY MOUTH EVERY DAY 12/05/22   Ngetich, Dinah C, NP  aspirin  81 MG chewable tablet Chew 81 mg by mouth in the morning. 09/28/20   [provider]  Cholecalciferol  (VITAMIN D3) 25 MCG (1000 UT) CAPS Take 4,000 Units by mouth daily. Take at home on Tues, Thurs, Sat, and Sun    [provider]  doxazosin  (CARDURA ) 4 MG tablet Take 1  tablet (4 mg total) by mouth at bedtime. 05/31/23   Clearnce Curia, NP  DULoxetine  (CYMBALTA ) 60 MG capsule Take 60 mg by mouth in the morning.    [provider]  fluticasone  furoate-vilanterol (BREO ELLIPTA ) 200-25 MCG/ACT AEPB Inhale 1 puff into the lungs in the morning and at bedtime. Patient not taking: Reported on 04/22/2023 03/23/23   Shitarev, Dimitry, MD  guaiFENesin  (MUCINEX ) 600 MG 12 hr tablet Take 1,200 mg by mouth 2 (two) times daily.    [provider]  hydrALAZINE  (APRESOLINE ) 50 MG tablet Take 1 tablet (50 mg total) by mouth 3 (three) times daily. 05/31/23   Walker, Caitlin S, NP  isosorbide  mononitrate (IMDUR ) 60 MG 24 hr tablet Take 60 mg by mouth in the morning. 10/04/22   [provider]  metoprolol  tartrate (LOPRESSOR ) 50 MG tablet Take 1 tablet (50 mg total) by mouth 2 (two) times daily. 05/31/23   Walker, Caitlin S, NP  multivitamin (RENA-VIT) TABS tablet Take 1 tablet by mouth daily. 01/29/23   Ngetich, Dinah C, NP  ondansetron  (ZOFRAN -ODT) 4 MG disintegrating tablet TAKE 1 TABLET BY MOUTH EVERY 8 HOURS AS NEEDED FOR NAUSEA AND VOMITING 10/02/22   Ngetich, Dinah C, NP  pantoprazole  (PROTONIX ) 40 MG tablet Take 1 tablet (40 mg total) by mouth daily. 03/23/23   Shitarev, Dimitry, MD  pravastatin  (PRAVACHOL ) 40 MG tablet TAKE 1 TABLET BY  MOUTH EVERY DAY IN THE EVENING 12/04/22   Ngetich, Dinah C, NP  traZODone  (DESYREL ) 50 MG tablet TAKE 0.5-1 TABLET (25-50 MG TOTAL) BY MOUTH AT BEDTIME AS NEEDED. FOR SLEEP 04/08/23   Ngetich, Dinah C, NP  umeclidinium bromide  (INCRUSE ELLIPTA ) 62.5 MCG/ACT AEPB Inhale 1 puff into the lungs daily. Patient not taking: Reported on 04/22/2023 03/24/23   Homer Lust, MD  VELPHORO  500 MG chewable tablet Chew 1,000 mg by mouth 3 (three) times daily with meals. 02/06/23   [provider]      Allergies    Codeine and Hydrocodone -acetaminophen     Review of Systems   Review of Systems  Respiratory:  Positive for shortness  of breath.     Physical Exam Updated Vital Signs BP (!) 187/85   Pulse 79   Temp 98.6 F (37 C) (Oral)   Resp 19   SpO2 100%  Physical Exam Constitutional:      General: He is not in acute distress. Cardiovascular:     Rate and Rhythm: Normal rate and regular rhythm.     Pulses: Normal pulses.     Heart sounds: Normal heart sounds.  Pulmonary:     Effort: Respiratory distress present.     Comments: Only able to speak in 1-2 word sentences No productive cough Rhonchi noted bilaterally Musculoskeletal:     Right lower leg: No tenderness. No edema.     Left lower leg: No tenderness. No edema.  Skin:    General: Skin is warm and dry.  Neurological:     Mental Status: He is alert.  Psychiatric:        Mood and Affect: Mood normal.     ED Results / Procedures / Treatments   Labs (all labs ordered are listed, but only abnormal results are displayed) Labs Reviewed  BASIC METABOLIC PANEL WITH GFR - Abnormal; Notable for the following components:      Result Value   Potassium 2.8 (*)    Chloride 94 (*)    Glucose, Bld 110 (*)    Creatinine, Ser 4.05 (*)    Calcium  8.1 (*)    GFR, Estimated 16 (*)    All other components within normal limits  CBC WITH DIFFERENTIAL/PLATELET - Abnormal; Notable for the following components:   WBC 2.3 (*)    RBC 2.13 (*)    Hemoglobin 6.8 (*)    HCT 21.4 (*)    MCV 100.5 (*)    Platelets 74 (*)    Neutro Abs 1.4 (*)    Lymphs Abs 0.5 (*)    All other components within normal limits  BRAIN NATRIURETIC PEPTIDE - Abnormal; Notable for the following components:   B Natriuretic Peptide 1,727.0 (*)    All other components within normal limits  TROPONIN I (HIGH SENSITIVITY) - Abnormal; Notable for the following components:   Troponin I (High Sensitivity) 63 (*)    All other components within normal limits  RESP PANEL BY RT-PCR (RSV, FLU A&B, COVID)  RVPGX2  RESPIRATORY PANEL BY PCR  HEPATITIS B SURFACE ANTIBODY, QUANTITATIVE  HEPATITIS B  SURFACE ANTIGEN  HIV ANTIBODY (ROUTINE TESTING W REFLEX)  BASIC METABOLIC PANEL WITH GFR  CBC  BLOOD GAS, VENOUS  TYPE AND SCREEN  PREPARE RBC (CROSSMATCH)  TROPONIN I (HIGH SENSITIVITY)    EKG None  Radiology DG Chest Port 1 View Result Date: 06/19/2023 CLINICAL DATA:  Short of breath. EXAM: PORTABLE CHEST 1 VIEW COMPARISON:  03/21/2023 and older exams. FINDINGS: Mild enlargement  of the cardiac silhouette, stable. No mediastinal or hilar masses. Vascular congestion with bilateral prominent interstitial markings similar to the prior exam. No lung consolidation. No convincing pleural effusion and no pneumothorax. Stable changes from a prior cervical spine fusion. Skeletal structures are grossly intact. IMPRESSION: 1. Cardiomegaly with vascular congestion and interstitial prominence similar to the prior chest radiograph. Consider mild chronic congestive heart failure. No evidence of pneumonia. Electronically Signed   By: Amanda Jungling M.D.   On: 06/19/2023 17:42    Procedures .Critical Care  Performed by: Denese Finn, PA-C Authorized by: Denese Finn, PA-C   Critical care provider statement:    Critical care time (minutes):  50   Critical care time was exclusive of:  Separately billable procedures and treating other patients   Critical care was necessary to treat or prevent imminent or life-threatening deterioration of the following conditions:  Respiratory failure   Critical care was time spent personally by me on the following activities:  Blood draw for specimens, development of treatment plan with patient or surrogate, discussions with consultants, evaluation of patient's response to treatment, examination of patient, obtaining history from patient or surrogate, review of old charts, re-evaluation of patient's condition, pulse oximetry, ordering and review of radiographic studies, ordering and review of laboratory studies and ordering and performing treatments and  interventions   I assumed direction of critical care for this patient from another provider in my specialty: no     Care discussed with: admitting provider       Medications Ordered in ED Medications  Chlorhexidine  Gluconate Cloth 2 % PADS 6 each (has no administration in time range)  0.9 %  sodium chloride  infusion (Manually program via Guardrails IV Fluids) (has no administration in time range)  ipratropium-albuterol  (DUONEB) 0.5-2.5 (3) MG/3ML nebulizer solution (0 mLs  Hold 06/19/23 1734)  amLODipine  (NORVASC ) tablet 10 mg (has no administration in time range)  doxazosin  (CARDURA ) tablet 4 mg (has no administration in time range)  metoprolol  tartrate (LOPRESSOR ) tablet 50 mg (has no administration in time range)  isosorbide  mononitrate (IMDUR ) 24 hr tablet 60 mg (has no administration in time range)  DULoxetine  (CYMBALTA ) DR capsule 60 mg (has no administration in time range)  pantoprazole  (PROTONIX ) injection 40 mg (has no administration in time range)  sodium chloride  flush (NS) 0.9 % injection 3 mL (has no administration in time range)  sodium chloride  flush (NS) 0.9 % injection 3 mL (has no administration in time range)  0.9 %  sodium chloride  infusion (has no administration in time range)  methylPREDNISolone  sodium succinate (SOLU-MEDROL ) 40 mg/mL injection 40 mg (has no administration in time range)  ipratropium-albuterol  (DUONEB) 0.5-2.5 (3) MG/3ML nebulizer solution 3 mL (has no administration in time range)  ipratropium-albuterol  (DUONEB) 0.5-2.5 (3) MG/3ML nebulizer solution 3 mL (3 mLs Nebulization Given 06/19/23 1728)    ED Course/ Medical Decision Making/ A&P                                 Medical Decision Making Amount and/or Complexity of Data Reviewed Labs: ordered. Radiology: ordered.  Risk Prescription drug management. Decision regarding hospitalization.   HASAAN RADDE 63 y.o. presented today for shortness of breath.  Working DDx that I considered at  this time includes, but not limited to, asthma/COPD exacerbation, URI, viral illness, anemia, ACS, PE, pneumonia, pleural effusion, lung cancer, CHF, respiratory distress, medication side effect, intoxication.  R/o DDx: Acute  pulmonary edema, asthma/COPD exacerbation, URI, viral illness, anemia, ACS, PE, pneumonia, lung cancer, medication side effect, intoxication: These are considered less likely due to history of present illness, physical exam, labs/imaging findings  Review of prior external notes: None  Unique Tests and My Independent Interpretation:  CBC: Anemia 6.8 BMP: Hypokalemia 2.8, creatinine 4.05 EKG: Sinus 76 bpm, no signs of right heart strain or ST elevation Troponin: 63 BNP: 1727 CXR: Basilar congestion with chronic cardiomegaly  Social Determinants of Health: none  Discussion with Independent Historian: EMS  Discussion of Management of Tests: Wouk, MD Hospitalist; Cindra Cree, MD Nephrology  Risk: High: hospitalization or escalation of hospital-level care  Risk Stratification Score: none  Staffed with Messick, MD  Plan: On exam patient was in acute respiratory distress with stable vitals.  On exam patient does have rhonchi and wheezing bilaterally and is already on BiPAP.  Given patient's past medical history do believe patient has an acute pulmonary edema and is already received 1 DuoNeb along with 0.8 nitroglycerin .  According EMS patient is improving and patient does feel he is improving and so we will hold off on more nitro as his blood pressure is 152 systolic and keep him on BiPAP.  Labs were obtained.  Will give DuoNeb to see if this helps.  Do plan on admission.  Labs do show troponin of 63 which is most likely demand ischemia in the setting of flash pulmonary edema secondary from ESRD along with hemoglobin of 6.8.  Patient denied any melena with me or hematochezia.  Do believe patient is anemic as he was just dialyzed.  Will get type and screen.   I talked to the  hospitalist and they will admit.  Nephrology is made aware of the patient.  Type and screen is come back and patient reported the hospital stay has been having melena and so this may be the cause of the anemia then.  Stable to be admitted.  This chart was dictated using voice recognition software.  Despite best efforts to proofread,  errors can occur which can change the documentation meaning.         Final Clinical Impression(s) / ED Diagnoses Final diagnoses:  Acute pulmonary edema (HCC)  Anemia due to chronic kidney disease, on chronic dialysis [facility])    Rx / DC Orders ED Discharge Orders     None         Elex Grimmer 06/19/23 1759    Burnette Carte, MD 06/19/23 2223

## 2023-06-19 NOTE — ED Notes (Addendum)
 PT removed bi pap device, pt states he feels like he's "breathing okay, but is comfortable." 95% RA, PT requesting food and drink, given mouth swabs until diet order placed

## 2023-06-19 NOTE — ED Notes (Addendum)
------------  EMS REPORT-------------   BIB EMS dialysis   63 YOM  3 1/2 hours completed, stopped 30 min early  Acute on set severe shob at dialysis   Mild Shob 3-4 days with new cough   Out of his inhaler   100% on [place] per EMS, but speaking in 1-2 word sentences   EMS placed on CPAP  Rales, wheezing throughout, worse on R  1 duo  .8 nitroglycerin  total  Able to speak in complete sentences on CPAP  18g R forearm   Aox4   GCS 15  162/80 Hr 90 100% CPAP  Uses CPAP at home as needed, and wears it at night

## 2023-06-19 NOTE — ED Notes (Signed)
 Blood consent signed by patient. Witnessed by this paramedic.

## 2023-06-19 NOTE — ED Notes (Addendum)
 15 minute v/s documented for blood transfusion.  Pt resting comfortably at this time with no signs of distress.

## 2023-06-20 ENCOUNTER — Encounter (HOSPITAL_COMMUNITY): Payer: Self-pay | Admitting: Obstetrics and Gynecology

## 2023-06-20 DIAGNOSIS — R0603 Acute respiratory distress: Secondary | ICD-10-CM | POA: Diagnosis not present

## 2023-06-20 DIAGNOSIS — D61818 Other pancytopenia: Secondary | ICD-10-CM | POA: Diagnosis not present

## 2023-06-20 DIAGNOSIS — K921 Melena: Secondary | ICD-10-CM | POA: Diagnosis not present

## 2023-06-20 DIAGNOSIS — D500 Iron deficiency anemia secondary to blood loss (chronic): Secondary | ICD-10-CM

## 2023-06-20 DIAGNOSIS — R9389 Abnormal findings on diagnostic imaging of other specified body structures: Secondary | ICD-10-CM | POA: Diagnosis not present

## 2023-06-20 DIAGNOSIS — N186 End stage renal disease: Secondary | ICD-10-CM

## 2023-06-20 DIAGNOSIS — D649 Anemia, unspecified: Secondary | ICD-10-CM

## 2023-06-20 DIAGNOSIS — Z992 Dependence on renal dialysis: Secondary | ICD-10-CM

## 2023-06-20 LAB — TYPE AND SCREEN
ABO/RH(D): A POS
Antibody Screen: NEGATIVE
Unit division: 0

## 2023-06-20 LAB — CBC
HCT: 26.5 % — ABNORMAL LOW (ref 39.0–52.0)
Hemoglobin: 8.5 g/dL — ABNORMAL LOW (ref 13.0–17.0)
MCH: 31.7 pg (ref 26.0–34.0)
MCHC: 32.1 g/dL (ref 30.0–36.0)
MCV: 98.9 fL (ref 80.0–100.0)
Platelets: 78 10*3/uL — ABNORMAL LOW (ref 150–400)
RBC: 2.68 MIL/uL — ABNORMAL LOW (ref 4.22–5.81)
RDW: 14.6 % (ref 11.5–15.5)
WBC: 2.1 10*3/uL — ABNORMAL LOW (ref 4.0–10.5)
nRBC: 0 % (ref 0.0–0.2)

## 2023-06-20 LAB — BPAM RBC
Blood Product Expiration Date: 202506232359
ISSUE DATE / TIME: 202505281812
Unit Type and Rh: 6200

## 2023-06-20 LAB — BLOOD GAS, VENOUS
Acid-Base Excess: 8.6 mmol/L — ABNORMAL HIGH (ref 0.0–2.0)
Bicarbonate: 33.5 mmol/L — ABNORMAL HIGH (ref 20.0–28.0)
O2 Saturation: 74.2 %
Patient temperature: 36.6
pCO2, Ven: 45 mmHg (ref 44–60)
pH, Ven: 7.48 — ABNORMAL HIGH (ref 7.25–7.43)
pO2, Ven: 43 mmHg (ref 32–45)

## 2023-06-20 LAB — BASIC METABOLIC PANEL WITH GFR
Anion gap: 15 (ref 5–15)
BUN: 19 mg/dL (ref 8–23)
CO2: 26 mmol/L (ref 22–32)
Calcium: 8.7 mg/dL — ABNORMAL LOW (ref 8.9–10.3)
Chloride: 95 mmol/L — ABNORMAL LOW (ref 98–111)
Creatinine, Ser: 5.53 mg/dL — ABNORMAL HIGH (ref 0.61–1.24)
GFR, Estimated: 11 mL/min — ABNORMAL LOW (ref >60)
Glucose, Bld: 148 mg/dL — ABNORMAL HIGH (ref 70–99)
Potassium: 4 mmol/L (ref 3.5–5.1)
Sodium: 136 mmol/L (ref 135–145)

## 2023-06-20 LAB — CBG MONITORING, ED: Glucose-Capillary: 150 mg/dL — ABNORMAL HIGH (ref 70–99)

## 2023-06-20 LAB — ALBUMIN: Albumin: 3.5 g/dL (ref 3.5–5.0)

## 2023-06-20 LAB — HIV ANTIBODY (ROUTINE TESTING W REFLEX): HIV Screen 4th Generation wRfx: NONREACTIVE

## 2023-06-20 LAB — PHOSPHORUS: Phosphorus: 5 mg/dL — ABNORMAL HIGH (ref 2.5–4.6)

## 2023-06-20 MED ORDER — GUAIFENESIN 100 MG/5ML PO LIQD
5.0000 mL | ORAL | Status: DC | PRN
  Administered 2023-06-21 – 2023-06-24 (×7): 5 mL via ORAL
  Filled 2023-06-20 (×7): qty 5

## 2023-06-20 MED ORDER — SODIUM CHLORIDE 0.9 % IV SOLN: INTRAVENOUS | Status: DC

## 2023-06-20 MED ORDER — CHLORHEXIDINE GLUCONATE CLOTH 2 % EX PADS: 6.0000 | MEDICATED_PAD | Freq: Every day | CUTANEOUS | Status: DC

## 2023-06-20 NOTE — Consult Note (Addendum)
 Renal Service Consult Note Kirk [name]  Kirk [name] 06/20/2023 Lynae Sandifer, MD Requesting Physician: Dr. Lilyan Remedies  Reason for Consult: [name] w/ SOB HPI: The patient is a 63 y.o. year-old w/ PMH as below who presented to ED yesterday afternoon reporting acute onset of severe shortness of breath at dialysis.  He had most of his dialysis.  EMS placed him on CPAP.  He was given DuoNeb and nitroglycerin , blood pressure was 160/80.  Also have been having 1 week of black stools.  In the ED blood pressure was 180/80, he was on BiPAP, heart rate 80, respiratory rate 20-25.  Labs showed potassium 2.8, creatinine 4.0, BNP 1727.  Hgb 6.8.  Patient was admitted.  Overnight dialysis was arranged and they removed 2.7 L of fluid and gave him 1 unit of prbc's.  We are asked to see the patient for ESRD.   Pt seen in the ED, he is complaining that he wants solid food not liquids.  He appears short of breath.  No other complaints   ROS - denies CP, no joint pain, no HA, no blurry vision, no rash, no diarrhea, no nausea/ vomiting  PMH: Anemia Anxiety Asthma Cigarette smoker ESRD on HD HTN Gout History of failed renal transplant Sleep apnea Headaches   Past Surgical History  Past Surgical History:  Procedure Laterality Date   A/V FISTULAGRAM N/A 03/28/2023   Procedure: A/V Fistulagram;  Surgeon: Patrick Boor, MD;  Location: [facility] INVASIVE CV LAB;  Service: Cardiovascular;  Laterality: N/A;   AV FISTULA PLACEMENT Left ?2010   "forearm; at [facility]"   BACK SURGERY     CARDIAC CATHETERIZATION  03/21/2011   CHOLECYSTECTOMY N/A 10/21/2014   Procedure: LAPAROSCOPIC CHOLECYSTECTOMY WITH INTRAOPERATIVE CHOLANGIOGRAM;  Surgeon: Lillette Reid III, MD;  Location: [facility] OR;  Service: General;  Laterality: N/A;   COLONOSCOPY     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   IR ANGIO INTRA EXTRACRAN SEL INTERNAL CAROTID UNI R MOD SED  11/30/2021   IR ANGIOGRAM FOLLOW UP STUDY   11/30/2021   IR AV DIALY SHUNT INTRO NEEDLE/INTRACATH INITIAL W/PTA/IMG LEFT  12/13/2022   IR BONE MARROW BIOPSY & ASPIRATION  03/27/2022   IR NEURO EACH ADD'L AFTER BASIC UNI RIGHT (MS)  11/30/2021   IR TRANSCATH/EMBOLIZ  11/30/2021   IR US  GUIDE VASC ACCESS LEFT  12/13/2022   KIDNEY TRANSPLANT  08/17/2011   [facility]    LAPAROSCOPIC CHOLECYSTECTOMY  10/21/2014   w/IOC   LEFT HEART CATH AND CORONARY ANGIOGRAPHY N/A 04/09/2023   Procedure: LEFT HEART CATH AND CORONARY ANGIOGRAPHY;  Surgeon: Arleen Lacer, MD;  Location: [facility] INVASIVE CV LAB;  Service: Cardiovascular;  Laterality: N/A;   LEFT HEART CATHETERIZATION WITH CORONARY ANGIOGRAM N/A 03/21/2011   Procedure: LEFT HEART CATHETERIZATION WITH CORONARY ANGIOGRAM;  Surgeon: Hazle Lites, MD;  Location: [facility] CATH LAB;  Service: Cardiovascular;  Laterality: N/A;   NEPHRECTOMY  08/2013   removed transplaned kidney   PERIPHERAL VASCULAR BALLOON ANGIOPLASTY Left 03/28/2023   Procedure: PERIPHERAL VASCULAR BALLOON ANGIOPLASTY;  Surgeon: Patrick Boor, MD;  Location: [facility] INVASIVE CV LAB;  Service: Cardiovascular;  Laterality: Left;  80% outflow cephalic   POLYPECTOMY     POSTERIOR FUSION CERVICAL SPINE  06/25/2012   for spinal stenosis   RADIOLOGY WITH ANESTHESIA N/A 11/30/2021   Procedure: RIGHT MIDDLE MENINGEAL ARTERY EMBOLIZATION;  Surgeon: Radiologist, Medication, MD;  Location: [facility] OR;  Service: Radiology;  Laterality: N/A;  VASECTOMY  2010   Family History  Family History  Adopted: Yes  Problem Relation Age of Onset   Colon cancer Neg Hx    Esophageal cancer Neg Hx    Rectal cancer Neg Hx    Stomach cancer Neg Hx    Colon polyps Neg Hx    Social History  reports that he quit smoking about 3 years ago. His smoking use included cigarettes. He started smoking about 32 years ago. He has a 16 pack-year smoking history. He has never used smokeless tobacco. He reports that he does not drink alcohol and does not use drugs. Allergies  Allergies   Allergen Reactions   Codeine Nausea And Vomiting   Hydrocodone -Acetaminophen  Nausea And Vomiting   Home medications Prior to Admission medications   Medication Sig Start Date End Date Taking? Authorizing Provider  acetaminophen  (TYLENOL ) 500 MG tablet Take 1 tablet (500 mg total) by mouth every 6 (six) hours as needed for moderate pain. Patient taking differently: Take 1,000 mg by mouth every 6 (six) hours as needed for moderate pain (pain score 4-6) or headache. 09/12/14  Yes Lonita Roach, MD  albuterol  (VENTOLIN  HFA) 108 (90 Base) MCG/ACT inhaler Inhale 2 puffs into the lungs every 6 (six) hours as needed for wheezing or shortness of breath. 10/29/22  Yes Diamond Formica, MD  amLODipine  (NORVASC ) 10 MG tablet TAKE 1 TABLET BY MOUTH EVERY DAY Patient taking differently: Take 10 mg by mouth See admin instructions. Take 1 tablet (10mg ) by mouth once daily in the afternoon. 12/05/22  Yes Ngetich, Dinah C, NP  aspirin  EC 81 MG tablet Take 81 mg by mouth daily in the afternoon.   Yes [provider]  Cholecalciferol  (VITAMIN D3) 25 MCG (1000 UT) CAPS Take 4,000 Units by mouth daily.   Yes [provider]  doxazosin  (CARDURA ) 4 MG tablet Take 1 tablet (4 mg total) by mouth at bedtime. 05/31/23  Yes Clearnce Curia, NP  DULoxetine  (CYMBALTA ) 60 MG capsule Take 60 mg by mouth in the morning.   Yes [provider]  guaiFENesin  (MUCINEX ) 600 MG 12 hr tablet Take 1,200 mg by mouth 2 (two) times daily as needed for cough or to loosen phlegm.   Yes [provider]  hydrALAZINE  (APRESOLINE ) 50 MG tablet Take 1 tablet (50 mg total) by mouth 3 (three) times daily. Patient taking differently: Take 50 mg by mouth See admin instructions. Take 1 tablet (50mg ) by mouth three times daily if sBP > 140, otherwise skip dose. 05/31/23  Yes Clearnce Curia, NP  isosorbide  mononitrate (IMDUR ) 60 MG 24 hr tablet Take 60 mg by mouth daily. 10/04/22  Yes [provider]   metoprolol  tartrate (LOPRESSOR ) 50 MG tablet Take 1 tablet (50 mg total) by mouth 2 (two) times daily. Patient taking differently: Take 50 mg by mouth See admin instructions. Take 1 tablet (50mg ) by mouth twice daily, in the afternoon and at bedtime. 05/31/23  Yes Clearnce Curia, NP  multivitamin (RENA-VIT) TABS tablet Take 1 tablet by mouth daily. Patient taking differently: Take 1 tablet by mouth See admin instructions. Take 1 tablet by mouth once daily in the afternoon. 01/29/23  Yes Ngetich, Dinah C, NP  pravastatin  (PRAVACHOL ) 40 MG tablet TAKE 1 TABLET BY MOUTH EVERY DAY IN THE EVENING Patient taking differently: Take 40 mg by mouth at bedtime. TAKE 1 TABLET BY MOUTH EVERY DAY IN THE EVENING 12/04/22  Yes Ngetich, Dinah C, NP  sevelamer  carbonate (RENVELA ) 2.4  g PACK Take 4.8 g by mouth 3 (three) times daily with meals.   Yes [provider]  traMADol  (ULTRAM ) 50 MG tablet Take 50 mg by mouth daily as needed for severe pain (pain score 7-10).   Yes [provider]  traZODone  (DESYREL ) 50 MG tablet TAKE 0.5-1 TABLET (25-50 MG TOTAL) BY MOUTH AT BEDTIME AS NEEDED. FOR SLEEP Patient taking differently: Take 50 mg by mouth at bedtime. 04/08/23  Yes Ngetich, Dinah C, NP  UNABLE TO FIND Inhale 1 Device into the lungs at bedtime. CPAP   Yes [provider]  VELPHORO  500 MG chewable tablet Chew 1,000-1,500 mg by mouth See admin instructions. Chew and swallow 3 tablets (1500mg ) by mouth three times daily with meals and take 2 tablets (1000mg ) with snacks. 02/06/23  Yes [provider]  albuterol  (PROVENTIL ) (2.5 MG/3ML) 0.083% nebulizer solution Take 3 mLs (2.5 mg total) by nebulization every 6 (six) hours as needed for wheezing or shortness of breath. Patient not taking: Reported on 06/19/2023 11/13/22   Antonio Baumgarten, NP  fluticasone  furoate-vilanterol (BREO ELLIPTA ) 200-25 MCG/ACT AEPB Inhale 1 puff into the lungs in the morning and at bedtime. Patient not  taking: Reported on 06/19/2023 03/23/23   Shitarev, Dimitry, MD  losartan  (COZAAR ) 25 MG tablet Take 25 mg by mouth daily. Patient not taking: Reported on 06/19/2023    [provider]  umeclidinium bromide  (INCRUSE ELLIPTA ) 62.5 MCG/ACT AEPB Inhale 1 puff into the lungs daily. Patient not taking: Reported on 04/22/2023 03/24/23   Shitarev, Dimitry, MD     Vitals:   06/20/23 0645 06/20/23 0800 06/20/23 0900 06/20/23 1020  BP: (!) 146/76 (!) 142/74 (!) 154/61   Pulse: 71 74 81   Resp:  20 (!) 21   Temp:  98.1 F (36.7 C) 98 F (36.7 C) 98 F (36.7 C)  TempSrc:    Oral  SpO2: 99% 100% 98%    Exam Gen alert, no distress, 3L [place] No rash, cyanosis or gangrene Sclera anicteric, throat clear  No jvd or bruits Chest bilat basilar crackles RRR no MRG Abd soft ntnd no mass or ascites +bs GU defer MS no joint effusions or deformity Ext no sig LE or UE edema, no other edema Neuro is alert, Ox 3 , nf    LUA AVF+bruit       Renal-related home meds: Renvela  6 ac tid Losartan  25 every day Norvasc  10 every day Doxazosin  4 mg hs Hydralazine  50 tid Lopressor  50 bid Renavite every day Velphoro  3 ac tid     OP HD: MWF G-O 4h   B400   82.7kg  L AVF   Heparin  2500 Last OP HD 5/28, post wt 84.3kg Usually gets to dry wt, but wt gains are typically 3-4 kg  CXR 5/28 - vasc congestion and mild IS edema    Assessment/ Plan: Acute hypoxic resp failure/ pulm edema: CXR +vasc congestion, mild IS edema. Suspected volume overload. Had HD here early this morning w/ 2.7 L removed. Still appears SOB. Plan HD tomorrow am 1st, cont to lower volume as tolerated.  Acute on chronic anemia: w/ dark stools x 1 week. GI consulting.  ESRD: HD MWF. HD as above.  HTN: bp's wnl today. Cont current regimen.  Anemia of esrd: Hb 6.8 in ED, this am 8.5 after prbc's. Follow.  Secondary hyperparathyroidism: Ca in range, cont binders ac Alport's syndrome      Larry Poag  MD [facility] 06/20/2023, 10:55  AM  Recent Labs  Lab 06/19/23 1606 06/20/23 0426  HGB 6.8* 8.5*  CALCIUM  8.1* 8.7*  CREATININE 4.05* 5.53*  K 2.8* 4.0   Inpatient medications:  amLODipine   10 mg Oral Daily   Chlorhexidine  Gluconate Cloth  6 each Topical Q0600   Chlorhexidine  Gluconate Cloth  6 each Topical Q0600   doxazosin   4 mg Oral QHS   DULoxetine   60 mg Oral q AM   ipratropium-albuterol   3 mL Nebulization Q6H   isosorbide  mononitrate  60 mg Oral q AM   methylPREDNISolone  (SOLU-MEDROL ) injection  40 mg Intravenous Q12H   metoprolol  tartrate  50 mg Oral BID   pantoprazole  (PROTONIX ) IV  40 mg Intravenous Q12H   sodium chloride  flush  3 mL Intravenous Q12H    sodium chloride      sodium chloride , sodium chloride  flush

## 2023-06-20 NOTE — Progress Notes (Signed)
 PROGRESS NOTE    Kirk [name]  WUJ:[id number] DOB: [birth year] DOA: 06/19/2023 PCP: Estil Heman, NP   Brief Narrative:  HPI: Kirk [name] is a 63 y.o. male with medical history significant for hfref, copd, esrd on [facility] dialysis, htn, osa on cpap, a fib not anticoagulated, who presents with the above.   Reports feeling ill for several days, feeling short of breath, at rest but worse with ambulation. Some cough as well, no fevers. No vomiting or diarrhea but has had a week of black stool. Is on asa, denies nsaids. At dialysis today mid-way through session complained of worsening shortness of breath so ems alerted. Arrived on bipap.   Assessment & Plan:   Principal Problem:   Respiratory distress Active Problems:   OSA on CPAP   Essential hypertension   Alports syndrome   Obesity   Type 2 diabetes mellitus with diabetic nephropathy, without long-term current use of insulin  (HCC)   SCC (squamous cell carcinoma), arm   Narcotic drug use   ESRD (end stage renal disease) (HCC)   Chronic systolic heart failure (HCC)   Stage IV carcinoma of breast (HCC)   COPD (chronic obstructive pulmonary disease) (HCC)   PAF (paroxysmal atrial fibrillation) (HCC)  Acute hypoxic respiratory failure, multifactorial secondary to rhinovirus infection, volume overload, acute COPD exacerbation and anemia, POA: Initially patient required BiPAP, currently feeling better and weaned to room air.  Had wheezes initially, started on prednisone .  Continue breathing treatments.  Respiratory viral panel positive for rhinovirus infection.  Chest x-ray with vascular congestion volume overload, nephrology on board, received dialysis.  See below for anemia.  Continue symptomatic and supportive care for viral pneumonia.  Acute on chronic anemia: Has a history of anemia of chronic disease due to ESRD, baseline hemoglobin between 8 and 10.  Came in with hemoglobin of 6.8.  History of melena for a week.  FOBT was never  checked.  GI has been consulted.  Transfused 1 unit of PRBC and hemoglobin 8.5 posttransfusion.  GI is consulted.  Continue twice daily PPI.  ESRD on HD: Dialysis per nephrology.  Heart failure with reduced ejection fraction: EF 35% on recent echo.  Chest x-ray with vascular congestion.  Volume management per dialysis.  Hypokalemia: Corrected with dialysis.  Essential hypertension: Blood pressure controlled, continue home medications which includes amlodipine , Lopressor  and Imdur .  Type 2 diabetes mellitus: Diet controlled.  Continue SSI.  History of OSA: CPAP at night.  History of A-fib: Not anticoagulated per cardiology.  History of pancytopenia: Follows with oncology.  DVT prophylaxis: SCDs Start: 06/19/23 1750   Code Status: Full Code  Family Communication:  None present at bedside.  Plan of care discussed with patient in length and he/she verbalized understanding and agreed with it.  Status is: Inpatient Remains inpatient appropriate because: Still hypoxic, requires further workup and evaluation by GI.   Estimated body mass index is 28.16 kg/m as calculated from the following:   Height as of 04/22/23: 5\' 8"  (1.727 m).   Weight as of 04/22/23: 84 kg.    Nutritional Assessment: There is no height or weight on file to calculate BMI.. Seen by dietician.  I agree with the assessment and plan as outlined below: Nutrition Status:        . Skin Assessment: I have examined the patient's skin and I agree with the wound assessment as performed by the wound care RN as outlined below:    Consultants:  GI nephrology  Procedures:  As  above  Antimicrobials:  Anti-infectives (From admission, onward)    None         Subjective: Patient seen and examined, still in the ED.  Sitting in the chair.  Appears comfortable but states that he is not feeling better and his breathing is still hard and no different than yesterday.  Able to speak in full sentences though.  He did not  have any other complaint.  He said that his dark stool/melena has actually improved since past 2 days.  Objective: Vitals:   06/20/23 0445 06/20/23 0630 06/20/23 0645 06/20/23 0800  BP: 138/80 (!) 150/78 (!) 146/76 (!) 142/74  Pulse: 76 72 71 74  Resp: 20 20  20   Temp: 98.3 F (36.8 C)   98.1 F (36.7 C)  TempSrc: Oral     SpO2: 100% 100% 99% 100%    Intake/Output Summary (Last 24 hours) at 06/20/2023 0859 Last data filed at 06/20/2023 0145 Gross per 24 hour  Intake 315 ml  Output 2700 ml  Net -2385 ml   There were no vitals filed for this visit.  Examination:  General exam: Appears calm and comfortable  Respiratory system: Diffuse expiratory wheezes with poor inspiratory effort.  No rhonchi or crackles. Cardiovascular system: S1 & S2 heard, RRR. No JVD, murmurs, rubs, gallops or clicks.  +1 pitting edema bilateral lower extremity. Gastrointestinal system: Abdomen is nondistended, soft and nontender. No organomegaly or masses felt. Normal bowel sounds heard. Central nervous system: Alert and oriented. No focal neurological deficits. Extremities: Symmetric 5 x 5 power. Skin: No rashes, lesions or ulcers Psychiatry: Judgement and insight appear normal. Mood & affect appropriate.    Data Reviewed: I have personally reviewed following labs and imaging studies  CBC: Recent Labs  Lab 06/19/23 1606 06/20/23 0426  WBC 2.3* 2.1*  NEUTROABS 1.4*  --   HGB 6.8* 8.5*  HCT 21.4* 26.5*  MCV 100.5* 98.9  PLT 74* 78*   Basic Metabolic Panel: Recent Labs  Lab 06/19/23 1606 06/20/23 0426  NA 136 136  K 2.8* 4.0  CL 94* 95*  CO2 31 26  GLUCOSE 110* 148*  BUN 10 19  CREATININE 4.05* 5.53*  CALCIUM  8.1* 8.7*   GFR: CrCl cannot be calculated (Unknown ideal weight.). Liver Function Tests: No results for input(s): "AST", "ALT", "ALKPHOS", "BILITOT", "PROT", "ALBUMIN " in the last 168 hours. No results for input(s): "LIPASE", "AMYLASE" in the last 168 hours. No results for  input(s): "AMMONIA" in the last 168 hours. Coagulation Profile: No results for input(s): "INR", "PROTIME" in the last 168 hours. Cardiac Enzymes: No results for input(s): "CKTOTAL", "CKMB", "CKMBINDEX", "TROPONINI" in the last 168 hours. BNP (last 3 results) No results for input(s): "PROBNP" in the last 8760 hours. HbA1C: No results for input(s): "HGBA1C" in the last 72 hours. CBG: Recent Labs  Lab 06/20/23 0823  GLUCAP 150*   Lipid Profile: No results for input(s): "CHOL", "HDL", "LDLCALC", "TRIG", "CHOLHDL", "LDLDIRECT" in the last 72 hours. Thyroid  Function Tests: No results for input(s): "TSH", "T4TOTAL", "FREET4", "T3FREE", "THYROIDAB" in the last 72 hours. Anemia Panel: No results for input(s): "VITAMINB12", "FOLATE", "FERRITIN", "TIBC", "IRON", "RETICCTPCT" in the last 72 hours. Sepsis Labs: No results for input(s): "PROCALCITON", "LATICACIDVEN" in the last 168 hours.  Recent Results (from the past 240 hours)  Resp panel by RT-PCR (RSV, Flu A&B, Covid) Anterior Nasal Swab     Status: None   Collection Time: 06/19/23  4:06 PM   Specimen: Anterior Nasal Swab  Result Value Ref  Range Status   SARS Coronavirus 2 by RT PCR NEGATIVE NEGATIVE Final   Influenza A by PCR NEGATIVE NEGATIVE Final   Influenza B by PCR NEGATIVE NEGATIVE Final    Comment: (NOTE) The Xpert Xpress SARS-CoV-2/FLU/RSV plus assay is intended as an aid in the diagnosis of influenza from Nasopharyngeal swab specimens and should not be used as a sole basis for treatment. Nasal washings and aspirates are unacceptable for Xpert Xpress SARS-CoV-2/FLU/RSV testing.  Fact Sheet for Patients: BloggerCourse.com  Fact Sheet for Healthcare Providers: SeriousBroker.it  This test is not yet approved or cleared by the [place]  FDA and has been authorized for detection and/or diagnosis of SARS-CoV-2 by FDA under an Emergency Use Authorization (EUA). This EUA will  remain in effect (meaning this test can be used) for the duration of the COVID-19 declaration under Section 564(b)(1) of the Act, 21 U.S.C. section 360bbb-3(b)(1), unless the authorization is terminated or revoked.     Resp Syncytial Virus by PCR NEGATIVE NEGATIVE Final    Comment: (NOTE) Fact Sheet for Patients: BloggerCourse.com  Fact Sheet for Healthcare Providers: SeriousBroker.it  This test is not yet approved or cleared by the [place]  FDA and has been authorized for detection and/or diagnosis of SARS-CoV-2 by FDA under an Emergency Use Authorization (EUA). This EUA will remain in effect (meaning this test can be used) for the duration of the COVID-19 declaration under Section 564(b)(1) of the Act, 21 U.S.C. section 360bbb-3(b)(1), unless the authorization is terminated or revoked.  Performed at [facility], [address], [place], [place] [address]   Respiratory (~20 pathogens) panel by PCR     Status: Abnormal   Collection Time: 06/19/23  4:06 PM   Specimen: Nasopharyngeal Swab; Respiratory  Result Value Ref Range Status   Adenovirus NOT DETECTED NOT DETECTED Final   Coronavirus 229E NOT DETECTED NOT DETECTED Final    Comment: (NOTE) The Coronavirus on the Respiratory Panel, DOES NOT test for the novel  Coronavirus (2019 nCoV)    Coronavirus HKU1 NOT DETECTED NOT DETECTED Final   Coronavirus NL63 NOT DETECTED NOT DETECTED Final   Coronavirus OC43 NOT DETECTED NOT DETECTED Final   Metapneumovirus NOT DETECTED NOT DETECTED Final   Rhinovirus / Enterovirus DETECTED (A) NOT DETECTED Final   Influenza A NOT DETECTED NOT DETECTED Final   Influenza B NOT DETECTED NOT DETECTED Final   Parainfluenza Virus 1 NOT DETECTED NOT DETECTED Final   Parainfluenza Virus 2 NOT DETECTED NOT DETECTED Final   Parainfluenza Virus 3 NOT DETECTED NOT DETECTED Final   Parainfluenza Virus 4 NOT DETECTED NOT DETECTED Final    Respiratory Syncytial Virus NOT DETECTED NOT DETECTED Final   Bordetella pertussis NOT DETECTED NOT DETECTED Final   Bordetella Parapertussis NOT DETECTED NOT DETECTED Final   Chlamydophila pneumoniae NOT DETECTED NOT DETECTED Final   Mycoplasma pneumoniae NOT DETECTED NOT DETECTED Final    Comment: Performed at [facility], [address] [place], [place] [address]     Radiology Studies: DG Chest Port 1 View Result Date: 06/19/2023 CLINICAL DATA:  Short of breath. EXAM: PORTABLE CHEST 1 VIEW COMPARISON:  03/21/2023 and older exams. FINDINGS: Mild enlargement of the cardiac silhouette, stable. No mediastinal or hilar masses. Vascular congestion with bilateral prominent interstitial markings similar to the prior exam. No lung consolidation. No convincing pleural effusion and no pneumothorax. Stable changes from a prior cervical spine fusion. Skeletal structures are grossly intact. IMPRESSION: 1. Cardiomegaly with vascular congestion and interstitial prominence similar  to the prior chest radiograph. Consider mild chronic congestive heart failure. No evidence of pneumonia. Electronically Signed   By: Amanda Jungling M.D.   On: 06/19/2023 17:42    Scheduled Meds:  amLODipine   10 mg Oral Daily   Chlorhexidine  Gluconate Cloth  6 each Topical Q0600   doxazosin   4 mg Oral QHS   DULoxetine   60 mg Oral q AM   ipratropium-albuterol   3 mL Nebulization Q6H   isosorbide  mononitrate  60 mg Oral q AM   methylPREDNISolone  (SOLU-MEDROL ) injection  40 mg Intravenous Q12H   metoprolol  tartrate  50 mg Oral BID   pantoprazole  (PROTONIX ) IV  40 mg Intravenous Q12H   sodium chloride  flush  3 mL Intravenous Q12H   Continuous Infusions:  sodium chloride        LOS: 1 day   Modena Andes, MD [facility]  06/20/2023, 8:59 AM   *Please note that this is a verbal dictation therefore any spelling or grammatical errors are due to the "Dragon Medical One" system interpretation.  Please page via Amion  and do not message via secure chat for urgent patient care matters. Secure chat can be used for non urgent patient care matters.  How to contact the [facility] Attending or Consulting provider 7A - 7P or covering provider during after hours 7P -7A, for this patient?  Check the care team in [facility] and look for a) attending/consulting [facility] provider listed and b) the [facility] team listed. Page or secure chat 7A-7P. Log into [email or web address] and use [place]'s universal password to access. If you do not have the password, please contact the hospital operator. Locate the [facility] provider you are looking for under [facility] and page to a number that you can be directly reached. If you still have difficulty reaching the provider, please page the [facility] (Director on Call) for the Hospitalists listed on amion for assistance.

## 2023-06-20 NOTE — ED Notes (Signed)
 Pt sitting upright in recliner.   Meal tray ordered and new diet order obtained.   Pt resting comfortably.   Pt watching TV and using phone.   Pt in no acute signs of distress.

## 2023-06-20 NOTE — Consult Note (Addendum)
 Consultation Note   Referring Provider:  [facility] PCP: Estil Heman, NP Primary Gastroenterologist: Previously Alisia Irons, MD          Reason for Consultation:  Anemia, dark stool  DOA: 06/19/2023         Hospital Day: 2   ASSESSMENT    Acute on chronic anemia  / reported dark stool intermittently x 1 month Hgb 6.8, down from 10  in March. Hgb improved to 8.5 after 1 unit of RBCs.   Pancytopenia Followed outpatient by Hematology Platelets 74  Metastatic breast cancer  Abnormal barium swallow Feb 2025  Possible short segment narrowing or distal esophageal ring holding up barium tablet. Currently no dysphagia  Alports syndrome / ESRD on HD MWF  Chronic systolic heart failure    See PMH for additional history  Principal Problem:   Respiratory distress Active Problems:   OSA on CPAP   Obesity   Type 2 diabetes mellitus with diabetic nephropathy, without long-term current use of insulin  (HCC)   Essential hypertension   SCC (squamous cell carcinoma), arm   Narcotic drug use   Alports syndrome   ESRD (end stage renal disease) (HCC)   Chronic systolic heart failure (HCC)   COPD (chronic obstructive pulmonary disease) (HCC)   PAF (paroxysmal atrial fibrillation) (HCC)     PLAN:   --Continue BID IV PPI --Monitor H/H --Needs EGD tomorrow for evaluation of presumed upper GI bleeding and also abnormal barium swallow. Appears volume overloaded and needs dialysis prior to EGD. I discussed with Nephrology and they will dialyze him in am. We will schedule EGD to be done tomorrow afternoon. The risks and benefits of EGD with possible biopsies were discussed with the patient who agrees to proceed. Will plan for tomorrow afternoon.   HPI   63 y.o. year old male with a medical history including but not limited to colon polyps, ESRD on HD, PAF, chronic systolic heart failure, HTN, diabetes, OSA, COPD, metastatic breast  cancer  [place] was admitted yesterday with [facility] and acute on chronic anemia. SHOB started a few weeks ago. He was having very dark stools about a month ago but didn't seek medical attention as stool color returned to normal. About one week ago he began having dark stools again. He also mentions that stools have been very loose and frequent over the last few weeks. He hasn't had any nausea / vomiting or abdominal pain . He doesn't take NSAIDs other than a daily baby asa.    CXR shows vascular congestion and interstitial prominence.   Patient had an abnormal barium swallow in Feb 2025 ( done for dysphagia). Findings included a possible short segment narrowing or distal esophageal ring holding up barium tablet.   Labs and Imaging:  Recent Labs    06/19/23 1606 06/20/23 0426  WBC 2.3* 2.1*  HGB 6.8* 8.5*  HCT 21.4* 26.5*  MCV 100.5* 98.9  PLT 74* 78*   Recent Labs    06/19/23 1606 06/20/23 0426  NA 136 136  K 2.8* 4.0  CL 94* 95*  CO2 31 26  GLUCOSE 110* 148*  BUN 10 19  CREATININE 4.05* 5.53*  CALCIUM  8.1* 8.7*     DG Chest Port 1  View CLINICAL DATA:  Short of breath.  EXAM: PORTABLE CHEST 1 VIEW  COMPARISON:  03/21/2023 and older exams.  FINDINGS: Mild enlargement of the cardiac silhouette, stable. No mediastinal or hilar masses.  Vascular congestion with bilateral prominent interstitial markings similar to the prior exam. No lung consolidation. No convincing pleural effusion and no pneumothorax.  Stable changes from a prior cervical spine fusion. Skeletal structures are grossly intact.  IMPRESSION: 1. Cardiomegaly with vascular congestion and interstitial prominence similar to the prior chest radiograph. Consider mild chronic congestive heart failure. No evidence of pneumonia.  Electronically Signed   By: Amanda Jungling M.D.   On: 06/19/2023 17:42    Pertinent GI Studies   December 2021 Surveillance colonoscopy  One 2 mm sigmoid polyp removed External  and internal hemorrhoids Path: tubular adenoma   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)    Anxiety    Asthma    Asthma    moderate persistent   Carpal tunnel syndrome    Cellulitis and abscess of trunk    Cholelithiasis 07/13/2014   Chronic headaches    Cigarette smoker 07/11/2017   Stopped 2/022  - referred to start Lung cancer screening 04/2021         Debility, unspecified    Dermatophytosis of the body    Dysrhythmia    history of   Edema    End stage renal disease on dialysis (HCC)    "MWF; [facility] in [place]" (10/21/2014)   Essential hypertension, benign    Generalized anxiety disorder 05/25/2022   GERD (gastroesophageal reflux disease)    Gout, unspecified    HTN (hypertension)    Hypertrophy of prostate without urinary obstruction and other lower urinary tract symptoms (LUTS)    Hypotension, unspecified    Hypoxia 03/21/2023   Impotence of organic origin    Insomnia, unspecified    Kidney replaced by transplant    Localization-related (focal) (partial) epilepsy and epileptic syndromes with complex partial seizures, without mention of intractable epilepsy    12-15-19- Wife states he has NEVER had a seizure    Lumbago    Memory loss    OSA on CPAP    Other and unspecified hyperlipidemia    controlled /managed per wife    Other chronic nonalcoholic liver disease    Other malaise and fatigue    Other nonspecific abnormal serum enzyme levels    Pain in joint, lower leg    Pain in joint, upper arm    Pneumonia "several times"   PONV (postoperative nausea and vomiting)    Renal dialysis status(V45.11) 02/05/2010   restarted 01/02/13 ofter renal trransplant failure   Secondary hyperparathyroidism (of renal origin)    Shortness of breath    Sleep apnea    wears cpap    Tension headache    Unspecified  constipation    Unspecified essential hypertension    Unspecified hereditary and idiopathic peripheral neuropathy    Unspecified vitamin D  deficiency     Past Surgical History:  Procedure Laterality Date   A/V FISTULAGRAM N/A 03/28/2023   Procedure: A/V Fistulagram;  Surgeon: Patrick Boor, MD;  Location: [facility] INVASIVE CV LAB;  Service: Cardiovascular;  Laterality: N/A;   AV FISTULA PLACEMENT Left ?2010   "forearm; at [facility]"   BACK SURGERY  CARDIAC CATHETERIZATION  03/21/2011   CHOLECYSTECTOMY N/A 10/21/2014   Procedure: LAPAROSCOPIC CHOLECYSTECTOMY WITH INTRAOPERATIVE CHOLANGIOGRAM;  Surgeon: Lillette Reid III, MD;  Location: [facility] OR;  Service: General;  Laterality: N/A;   COLONOSCOPY     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   IR ANGIO INTRA EXTRACRAN SEL INTERNAL CAROTID UNI R MOD SED  11/30/2021   IR ANGIOGRAM FOLLOW UP STUDY  11/30/2021   IR AV DIALY SHUNT INTRO NEEDLE/INTRACATH INITIAL W/PTA/IMG LEFT  12/13/2022   IR BONE MARROW BIOPSY & ASPIRATION  03/27/2022   IR NEURO EACH ADD'L AFTER BASIC UNI RIGHT (MS)  11/30/2021   IR TRANSCATH/EMBOLIZ  11/30/2021   IR US  GUIDE VASC ACCESS LEFT  12/13/2022   KIDNEY TRANSPLANT  08/17/2011   [facility]    LAPAROSCOPIC CHOLECYSTECTOMY  10/21/2014   w/IOC   LEFT HEART CATH AND CORONARY ANGIOGRAPHY N/A 04/09/2023   Procedure: LEFT HEART CATH AND CORONARY ANGIOGRAPHY;  Surgeon: Arleen Lacer, MD;  Location: [facility] INVASIVE CV LAB;  Service: Cardiovascular;  Laterality: N/A;   LEFT HEART CATHETERIZATION WITH CORONARY ANGIOGRAM N/A 03/21/2011   Procedure: LEFT HEART CATHETERIZATION WITH CORONARY ANGIOGRAM;  Surgeon: Hazle Lites, MD;  Location: [facility] CATH LAB;  Service: Cardiovascular;  Laterality: N/A;   NEPHRECTOMY  08/2013   removed transplaned kidney   PERIPHERAL VASCULAR BALLOON ANGIOPLASTY Left 03/28/2023   Procedure: PERIPHERAL VASCULAR BALLOON ANGIOPLASTY;  Surgeon: Patrick Boor, MD;  Location: [facility] INVASIVE CV LAB;  Service:  Cardiovascular;  Laterality: Left;  80% outflow cephalic   POLYPECTOMY     POSTERIOR FUSION CERVICAL SPINE  06/25/2012   for spinal stenosis   RADIOLOGY WITH ANESTHESIA N/A 11/30/2021   Procedure: RIGHT MIDDLE MENINGEAL ARTERY EMBOLIZATION;  Surgeon: Radiologist, Medication, MD;  Location: [facility] OR;  Service: Radiology;  Laterality: N/A;   VASECTOMY  2010    Family History  Adopted: Yes  Problem Relation Age of Onset   Colon cancer Neg Hx    Esophageal cancer Neg Hx    Rectal cancer Neg Hx    Stomach cancer Neg Hx    Colon polyps Neg Hx     Prior to Admission medications   Medication Sig Start Date End Date Taking? Authorizing Provider  acetaminophen  (TYLENOL ) 500 MG tablet Take 1 tablet (500 mg total) by mouth every 6 (six) hours as needed for moderate pain. Patient taking differently: Take 1,000 mg by mouth every 6 (six) hours as needed for moderate pain (pain score 4-6) or headache. 09/12/14  Yes Lonita Roach, MD  albuterol  (VENTOLIN  HFA) 108 (90 Base) MCG/ACT inhaler Inhale 2 puffs into the lungs every 6 (six) hours as needed for wheezing or shortness of breath. 10/29/22  Yes Diamond Formica, MD  amLODipine  (NORVASC ) 10 MG tablet TAKE 1 TABLET BY MOUTH EVERY DAY Patient taking differently: Take 10 mg by mouth See admin instructions. Take 1 tablet (10mg ) by mouth once daily in the afternoon. 12/05/22  Yes Ngetich, Dinah C, NP  aspirin  EC 81 MG tablet Take 81 mg by mouth daily in the afternoon.   Yes [provider]  Cholecalciferol  (VITAMIN D3) 25 MCG (1000 UT) CAPS Take 4,000 Units by mouth daily.   Yes [provider]  doxazosin  (CARDURA ) 4 MG tablet Take 1 tablet (4 mg total) by mouth at bedtime. 05/31/23  Yes Clearnce Curia, NP  DULoxetine  (CYMBALTA ) 60 MG capsule Take 60 mg by mouth in the morning.   Yes [provider]  guaiFENesin  (MUCINEX ) 600 MG 12 hr tablet Take 1,200 mg by mouth 2 (two) times daily as needed for cough or to loosen phlegm.   Yes  [provider]  hydrALAZINE  (APRESOLINE ) 50 MG tablet Take 1 tablet (50 mg total) by mouth 3 (three) times daily. Patient taking differently: Take 50 mg by mouth See admin instructions. Take 1 tablet (50mg ) by mouth three times daily if sBP > 140, otherwise skip dose. 05/31/23  Yes Clearnce Curia, NP  isosorbide  mononitrate (IMDUR ) 60 MG 24 hr tablet Take 60 mg by mouth daily. 10/04/22  Yes [provider]  metoprolol  tartrate (LOPRESSOR ) 50 MG tablet Take 1 tablet (50 mg total) by mouth 2 (two) times daily. Patient taking differently: Take 50 mg by mouth See admin instructions. Take 1 tablet (50mg ) by mouth twice daily, in the afternoon and at bedtime. 05/31/23  Yes Clearnce Curia, NP  multivitamin (RENA-VIT) TABS tablet Take 1 tablet by mouth daily. Patient taking differently: Take 1 tablet by mouth See admin instructions. Take 1 tablet by mouth once daily in the afternoon. 01/29/23  Yes Ngetich, Dinah C, NP  pravastatin  (PRAVACHOL ) 40 MG tablet TAKE 1 TABLET BY MOUTH EVERY DAY IN THE EVENING Patient taking differently: Take 40 mg by mouth at bedtime. TAKE 1 TABLET BY MOUTH EVERY DAY IN THE EVENING 12/04/22  Yes Ngetich, Dinah C, NP  sevelamer  carbonate (RENVELA ) 2.4 g PACK Take 4.8 g by mouth 3 (three) times daily with meals.   Yes [provider]  traMADol  (ULTRAM ) 50 MG tablet Take 50 mg by mouth daily as needed for severe pain (pain score 7-10).   Yes [provider]  traZODone  (DESYREL ) 50 MG tablet TAKE 0.5-1 TABLET (25-50 MG TOTAL) BY MOUTH AT BEDTIME AS NEEDED. FOR SLEEP Patient taking differently: Take 50 mg by mouth at bedtime. 04/08/23  Yes Ngetich, Dinah C, NP  UNABLE TO FIND Inhale 1 Device into the lungs at bedtime. CPAP   Yes [provider]  VELPHORO  500 MG chewable tablet Chew 1,000-1,500 mg by mouth See admin instructions. Chew and swallow 3 tablets (1500mg ) by mouth three times daily with meals and take 2 tablets (1000mg ) with snacks.  02/06/23  Yes [provider]  albuterol  (PROVENTIL ) (2.5 MG/3ML) 0.083% nebulizer solution Take 3 mLs (2.5 mg total) by nebulization every 6 (six) hours as needed for wheezing or shortness of breath. Patient not taking: Reported on 06/19/2023 11/13/22   Antonio Baumgarten, NP  fluticasone  furoate-vilanterol (BREO ELLIPTA ) 200-25 MCG/ACT AEPB Inhale 1 puff into the lungs in the morning and at bedtime. Patient not taking: Reported on 06/19/2023 03/23/23   Shitarev, Dimitry, MD  losartan  (COZAAR ) 25 MG tablet Take 25 mg by mouth daily. Patient not taking: Reported on 06/19/2023    [provider]  umeclidinium bromide  (INCRUSE ELLIPTA ) 62.5 MCG/ACT AEPB Inhale 1 puff into the lungs daily. Patient not taking: Reported on 04/22/2023 03/24/23   Homer Lust, MD    Current Facility-Administered Medications  Medication Dose Route Frequency Provider Last Rate Last Admin   0.9 %  sodium chloride  infusion  250 mL Intravenous PRN Wouk, Haynes Lips, MD       amLODipine  (NORVASC ) tablet 10 mg  10 mg Oral Daily Wouk, Haynes Lips, MD   10 mg at 06/19/23 1955   Chlorhexidine  Gluconate Cloth 2 % PADS 6 each  6 each Topical Q0600 Wouk, Haynes Lips, MD       doxazosin  (CARDURA ) tablet 4 mg  4  mg Oral QHS Janeane Mealy, MD   4 mg at 06/19/23 2135   DULoxetine  (CYMBALTA ) DR capsule 60 mg  60 mg Oral q AM Janeane Mealy, MD   60 mg at 06/20/23 0617   ipratropium-albuterol  (DUONEB) 0.5-2.5 (3) MG/3ML nebulizer solution 3 mL  3 mL Nebulization Q6H Wouk, Haynes Lips, MD   3 mL at 06/20/23 0756   isosorbide  mononitrate (IMDUR ) 24 hr tablet 60 mg  60 mg Oral q AM Janeane Mealy, MD   60 mg at 06/20/23 0617   methylPREDNISolone  sodium succinate (SOLU-MEDROL ) 40 mg/mL injection 40 mg  40 mg Intravenous Q12H Janeane Mealy, MD   40 mg at 06/20/23 0514   metoprolol  tartrate (LOPRESSOR ) tablet 50 mg  50 mg Oral BID Janeane Mealy, MD   50 mg at 06/19/23 2134   pantoprazole  (PROTONIX )  injection 40 mg  40 mg Intravenous Q12H Janeane Mealy, MD   40 mg at 06/19/23 1952   sodium chloride  flush (NS) 0.9 % injection 3 mL  3 mL Intravenous Q12H Wouk, Haynes Lips, MD       sodium chloride  flush (NS) 0.9 % injection 3 mL  3 mL Intravenous PRN Wouk, Haynes Lips, MD       Current Outpatient Medications  Medication Sig Dispense Refill   acetaminophen  (TYLENOL ) 500 MG tablet Take 1 tablet (500 mg total) by mouth every 6 (six) hours as needed for moderate pain. (Patient taking differently: Take 1,000 mg by mouth every 6 (six) hours as needed for moderate pain (pain score 4-6) or headache.)     albuterol  (VENTOLIN  HFA) 108 (90 Base) MCG/ACT inhaler Inhale 2 puffs into the lungs every 6 (six) hours as needed for wheezing or shortness of breath. 8 g 6   amLODipine  (NORVASC ) 10 MG tablet TAKE 1 TABLET BY MOUTH EVERY DAY (Patient taking differently: Take 10 mg by mouth See admin instructions. Take 1 tablet (10mg ) by mouth once daily in the afternoon.) 90 tablet 3   aspirin  EC 81 MG tablet Take 81 mg by mouth daily in the afternoon.     Cholecalciferol  (VITAMIN D3) 25 MCG (1000 UT) CAPS Take 4,000 Units by mouth daily.     doxazosin  (CARDURA ) 4 MG tablet Take 1 tablet (4 mg total) by mouth at bedtime. 90 tablet 3   DULoxetine  (CYMBALTA ) 60 MG capsule Take 60 mg by mouth in the morning.     guaiFENesin  (MUCINEX ) 600 MG 12 hr tablet Take 1,200 mg by mouth 2 (two) times daily as needed for cough or to loosen phlegm.     hydrALAZINE  (APRESOLINE ) 50 MG tablet Take 1 tablet (50 mg total) by mouth 3 (three) times daily. (Patient taking differently: Take 50 mg by mouth See admin instructions. Take 1 tablet (50mg ) by mouth three times daily if sBP > 140, otherwise skip dose.) 270 tablet 3   isosorbide  mononitrate (IMDUR ) 60 MG 24 hr tablet Take 60 mg by mouth daily.     metoprolol  tartrate (LOPRESSOR ) 50 MG tablet Take 1 tablet (50 mg total) by mouth 2 (two) times daily. (Patient taking differently:  Take 50 mg by mouth See admin instructions. Take 1 tablet (50mg ) by mouth twice daily, in the afternoon and at bedtime.) 180 tablet 3   multivitamin (RENA-VIT) TABS tablet Take 1 tablet by mouth daily. (Patient taking differently: Take 1 tablet by mouth See admin instructions. Take 1 tablet by mouth once daily in the afternoon.) 90 tablet 1   pravastatin  (  PRAVACHOL ) 40 MG tablet TAKE 1 TABLET BY MOUTH EVERY DAY IN THE EVENING (Patient taking differently: Take 40 mg by mouth at bedtime. TAKE 1 TABLET BY MOUTH EVERY DAY IN THE EVENING) 90 tablet 2   sevelamer  carbonate (RENVELA ) 2.4 g PACK Take 4.8 g by mouth 3 (three) times daily with meals.     traMADol  (ULTRAM ) 50 MG tablet Take 50 mg by mouth daily as needed for severe pain (pain score 7-10).     traZODone  (DESYREL ) 50 MG tablet TAKE 0.5-1 TABLET (25-50 MG TOTAL) BY MOUTH AT BEDTIME AS NEEDED. FOR SLEEP (Patient taking differently: Take 50 mg by mouth at bedtime.) 90 tablet 1   UNABLE TO FIND Inhale 1 Device into the lungs at bedtime. CPAP     VELPHORO  500 MG chewable tablet Chew 1,000-1,500 mg by mouth See admin instructions. Chew and swallow 3 tablets (1500mg ) by mouth three times daily with meals and take 2 tablets (1000mg ) with snacks.     albuterol  (PROVENTIL ) (2.5 MG/3ML) 0.083% nebulizer solution Take 3 mLs (2.5 mg total) by nebulization every 6 (six) hours as needed for wheezing or shortness of breath. (Patient not taking: Reported on 06/19/2023) 75 mL 1   fluticasone  furoate-vilanterol (BREO ELLIPTA ) 200-25 MCG/ACT AEPB Inhale 1 puff into the lungs in the morning and at bedtime. (Patient not taking: Reported on 06/19/2023) 60 each 0   losartan  (COZAAR ) 25 MG tablet Take 25 mg by mouth daily. (Patient not taking: Reported on 06/19/2023)     umeclidinium bromide  (INCRUSE ELLIPTA ) 62.5 MCG/ACT AEPB Inhale 1 puff into the lungs daily. (Patient not taking: Reported on 04/22/2023) 30 each 0   Facility-Administered Medications Ordered in Other  Encounters  Medication Dose Route Frequency Provider Last Rate Last Admin   sodium chloride  flush (NS) 0.9 % injection 10 mL  10 mL Intravenous Q12H Geary Kells, PA-C        Allergies as of 06/19/2023 - Review Complete 06/19/2023  Allergen Reaction Noted   Codeine Nausea And Vomiting 05/20/2007   Hydrocodone -acetaminophen  Nausea And Vomiting 11/01/2020    Social History   Socioeconomic History   Marital status: Married    Spouse name: Editor, commissioning   Number of children: 3   Years of education: Not on file   Highest education level: Not on file  Occupational History   Occupation: disabled    Employer: DISABLED  Tobacco Use   Smoking status: Former    Current packs/day: 0.00    Average packs/day: 0.5 packs/day for 32.0 years (16.0 ttl pk-yrs)    Types: Cigarettes    Start date: 89    Quit date: 02/24/2020    Years since quitting: 3.3   Smokeless tobacco: Never  Vaping Use   Vaping status: Never Used  Substance and Sexual Activity   Alcohol use: No    Alcohol/week: 0.0 standard drinks of alcohol   Drug use: No   Sexual activity: Yes  Other Topics Concern   Not on file  Social History Narrative   Drinks 1 cup of caffeine daily.   Social Drivers of Health   Financial Resource Strain: Medium Risk (11/02/2021)   Overall Financial Resource Strain (CARDIA)    Difficulty of Paying Living Expenses: Somewhat hard  Food Insecurity: No Food Insecurity (03/17/2023)   Hunger Vital Sign    Worried About Running Out of Food in the Last Year: Never true    Ran Out of Food in the Last Year: Never true  Recent Concern: Food Insecurity - Food  Insecurity Present (12/18/2022)   Hunger Vital Sign    Worried About Running Out of Food in the Last Year: Sometimes true    Ran Out of Food in the Last Year: Sometimes true  Transportation Needs: No Transportation Needs (03/17/2023)   PRAPARE - Administrator, Civil Service (Medical): No    Lack of Transportation (Non-Medical): No   Physical Activity: Insufficiently Active (06/25/2017)   Exercise Vital Sign    Days of Exercise per Week: 3 days    Minutes of Exercise per Session: 20 min  Stress: No Stress Concern Present (06/25/2017)   [employer] of Occupational Health - Occupational Stress Questionnaire    Feeling of Stress : Only a little  Social Connections: Patient Declined (03/17/2023)   Social Connection and Isolation Panel [NHANES]    Frequency of Communication with Friends and Family: Patient declined    Frequency of Social Gatherings with Friends and Family: Patient declined    Attends Religious Services: Patient declined    Database administrator or Organizations: Patient declined    Attends Banker Meetings: Patient declined    Marital Status: Patient declined  Intimate Partner Violence: Not At Risk (03/17/2023)   Humiliation, Afraid, Rape, and Kick questionnaire    Fear of Current or Ex-Partner: No    Emotionally Abused: No    Physically Abused: No    Sexually Abused: No     Code Status   Code Status: Full Code  Review of Systems: All systems reviewed and negative except where noted in HPI.  Physical Exam: Vital signs in last 24 hours: Temp:  [97.1 F (36.2 C)-98.6 F (37 C)] 98.3 F (36.8 C) (05/29 0445) Pulse Rate:  [67-89] 71 (05/29 0645) Resp:  [14-26] 20 (05/29 0630) BP: (102-189)/(47-90) 146/76 (05/29 0645) SpO2:  [94 %-100 %] 99 % (05/29 0645) FiO2 (%):  [50 %] 50 % (05/28 1841)    General:  Pleasant male in NAD Psych:  Cooperative. Normal mood and affect Eyes: Pupils equal Ears:  Normal auditory acuity Nose: No deformity, discharge or lesions Neck:  Supple, no masses felt Lungs:  A few bibasilar crackles and occasional expiratory wheezing.   Heart:  Regular rate, regular rhythm.  Abdomen:  Soft, nondistended, nontender, active bowel sounds, no masses felt Rectal :  Deferred Msk: Symmetrical without gross deformities.  Neurologic:  Alert, oriented, grossly  normal neurologically Extremities : No edema Skin:  Intact without significant lesions.    Intake/Output from previous day: 05/28 0701 - 05/29 0700 In: 315 [Blood:315] Out: 2700  Intake/Output this shift:  No intake/output data recorded.   Mai Schwalbe, NP-C   06/20/2023, 8:33 AM  I have taken an interval history, thoroughly reviewed the chart and examined the patient. I agree with the Advanced Practitioner's note, impression and recommendations, and have recorded additional findings, impressions and recommendations below. I performed a substantive portion of this encounter (>50% time spent), including a complete performance of the medical decision making.  My additional thoughts are as follows:  Acute on chronic anemia with reported black stool, concern for slow upper GI bleeding in a dialysis patient.  Pancytopenia with previous outpatient hematology evaluation, cause unclear other than presumably due to multiple medical illnesses.  Previous bone marrow biopsy unrevealing for primary hematologic disorder.  Needs upper endoscopy, but he is dyspneic and wheezing today, probably still somewhat volume overloaded and perhaps have a flare of asthma/COPD.  He has had some respiratory treatments so far, may need more.  Needs dialysis again before endoscopy, so we communicated with nephrology to plan HD tomorrow morning and tentatively plan EGD tomorrow afternoon.  Patient agreeable after discussion of procedure and risks. Patient at increased risk for cardiopulmonary complications of procedure due to medical comorbidities.  Serial hemoglobin and hematocrit, PPI, liquid diet okay today, n.p.o. after midnight    Kerby Pearson III Office:[phone]

## 2023-06-20 NOTE — Progress Notes (Signed)
   06/20/23 0145  Vitals  Temp 98.2 F (36.8 C)  Temp Source Oral  BP (!) 140/67  BP Location Right Arm  BP Method Automatic  Patient Position (if appropriate) Lying  Pulse Rate 69  Pulse Rate Source Monitor  ECG Heart Rate 69  Resp (!) 26  Oxygen  Therapy  SpO2 100 %  O2 Device Nasal Cannula  O2 Flow Rate (L/min) 3 L/min  During Treatment Monitoring  Blood Flow Rate (mL/min) 0 mL/min  Arterial Pressure (mmHg) -53.93 mmHg  Venous Pressure (mmHg) 67.07 mmHg  TMP (mmHg) 2.63 mmHg  Ultrafiltration Rate (mL/min) 1480 mL/min  Dialysate Flow Rate (mL/min) 300 ml/min  Dialysate Potassium Concentration 4  Dialysate Calcium  Concentration 2.5  Duration of HD Treatment -hour(s) 2.46 hour(s)  Cumulative Fluid Removed (mL) per Treatment  2696.19  HD Safety Checks Performed Yes  Intra-Hemodialysis Comments Tx completed  Post Treatment  Dialyzer Clearance Lightly streaked  Liters Processed 59  Fluid Removed (mL) 2700 mL  Tolerated HD Treatment Yes  AVG/AVF Arterial Site Held (minutes) 8 minutes  AVG/AVF Venous Site Held (minutes) 7 minutes

## 2023-06-20 NOTE — Plan of Care (Signed)

## 2023-06-20 NOTE — ED Notes (Signed)
 Unit called.

## 2023-06-20 NOTE — H&P (View-Only) (Signed)
 Consultation Note   Referring Provider:  [facility] PCP: Kirk Heman, NP Primary Gastroenterologist: Previously Kirk Irons, MD          Reason for Consultation:  Anemia, dark stool  DOA: 06/19/2023         Hospital Day: 2   ASSESSMENT    Acute on chronic anemia  / reported dark stool intermittently x 1 month Hgb 6.8, down from 10  in March. Hgb improved to 8.5 after 1 unit of RBCs.   Pancytopenia Followed outpatient by Hematology Platelets 74  Metastatic breast cancer  Abnormal barium swallow Feb 2025  Possible short segment narrowing or distal esophageal ring holding up barium tablet. Currently no dysphagia  Alports syndrome / ESRD on HD MWF  Chronic systolic heart failure    See PMH for additional history  Principal Problem:   Respiratory distress Active Problems:   OSA on CPAP   Obesity   Type 2 diabetes mellitus with diabetic nephropathy, without long-term current use of insulin  (HCC)   Essential hypertension   SCC (squamous cell carcinoma), arm   Narcotic drug use   Alports syndrome   ESRD (end stage renal disease) (HCC)   Chronic systolic heart failure (HCC)   COPD (chronic obstructive pulmonary disease) (HCC)   PAF (paroxysmal atrial fibrillation) (HCC)     PLAN:   --Continue BID IV PPI --Monitor H/H --Needs EGD tomorrow for evaluation of presumed upper GI bleeding and also abnormal barium swallow. Appears volume overloaded and needs dialysis prior to EGD. I discussed with Nephrology and they will dialyze him in am. We will schedule EGD to be done tomorrow afternoon. The risks and benefits of EGD with possible biopsies were discussed with the patient who agrees to proceed. Will plan for tomorrow afternoon.   HPI   63 y.o. year old male with a medical history including but not limited to colon polyps, ESRD on HD, PAF, chronic systolic heart failure, HTN, diabetes, OSA, COPD, metastatic breast  cancer  Baby was admitted yesterday with [facility] and acute on chronic anemia. SHOB started a few weeks ago. He was having very dark stools about a month ago but didn't seek medical attention as stool color returned to normal. About one week ago he began having dark stools again. He also mentions that stools have been very loose and frequent over the last few weeks. He hasn't had any nausea / vomiting or abdominal pain . He doesn't take NSAIDs other than a daily baby asa.    CXR shows vascular congestion and interstitial prominence.   Patient had an abnormal barium swallow in Feb 2025 ( done for dysphagia). Findings included a possible short segment narrowing or distal esophageal ring holding up barium tablet.   Labs and Imaging:  Recent Labs    06/19/23 1606 06/20/23 0426  WBC 2.3* 2.1*  HGB 6.8* 8.5*  HCT 21.4* 26.5*  MCV 100.5* 98.9  PLT 74* 78*   Recent Labs    06/19/23 1606 06/20/23 0426  NA 136 136  K 2.8* 4.0  CL 94* 95*  CO2 31 26  GLUCOSE 110* 148*  BUN 10 19  CREATININE 4.05* 5.53*  CALCIUM  8.1* 8.7*     DG Chest Port 1  View CLINICAL DATA:  Short of breath.  EXAM: PORTABLE CHEST 1 VIEW  COMPARISON:  03/21/2023 and older exams.  FINDINGS: Mild enlargement of the cardiac silhouette, stable. No mediastinal or hilar masses.  Vascular congestion with bilateral prominent interstitial markings similar to the prior exam. No lung consolidation. No convincing pleural effusion and no pneumothorax.  Stable changes from a prior cervical spine fusion. Skeletal structures are grossly intact.  IMPRESSION: 1. Cardiomegaly with vascular congestion and interstitial prominence similar to the prior chest radiograph. Consider mild chronic congestive heart failure. No evidence of pneumonia.  Electronically Signed   By: Amanda Jungling M.D.   On: 06/19/2023 17:42    Pertinent GI Studies   December 2021 Surveillance colonoscopy  One 2 mm sigmoid polyp removed External  and internal hemorrhoids Path: tubular adenoma   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)    Anxiety    Asthma    Asthma    moderate persistent   Carpal tunnel syndrome    Cellulitis and abscess of trunk    Cholelithiasis 07/13/2014   Chronic headaches    Cigarette smoker 07/11/2017   Stopped 2/022  - referred to start Lung cancer screening 04/2021         Debility, unspecified    Dermatophytosis of the body    Dysrhythmia    history of   Edema    End stage renal disease on dialysis (HCC)    "MWF; [facility] in [place]" (10/21/2014)   Essential hypertension, benign    Generalized anxiety disorder 05/25/2022   GERD (gastroesophageal reflux disease)    Gout, unspecified    HTN (hypertension)    Hypertrophy of prostate without urinary obstruction and other lower urinary tract symptoms (LUTS)    Hypotension, unspecified    Hypoxia 03/21/2023   Impotence of organic origin    Insomnia, unspecified    Kidney replaced by transplant    Localization-related (focal) (partial) epilepsy and epileptic syndromes with complex partial seizures, without mention of intractable epilepsy    12-15-19- Wife states he has NEVER had a seizure    Lumbago    Memory loss    OSA on CPAP    Other and unspecified hyperlipidemia    controlled /managed per wife    Other chronic nonalcoholic liver disease    Other malaise and fatigue    Other nonspecific abnormal serum enzyme levels    Pain in joint, lower leg    Pain in joint, upper arm    Pneumonia "several times"   PONV (postoperative nausea and vomiting)    Renal dialysis status(V45.11) 02/05/2010   restarted 01/02/13 ofter renal trransplant failure   Secondary hyperparathyroidism (of renal origin)    Shortness of breath    Sleep apnea    wears cpap    Tension headache    Unspecified  constipation    Unspecified essential hypertension    Unspecified hereditary and idiopathic peripheral neuropathy    Unspecified vitamin D  deficiency     Past Surgical History:  Procedure Laterality Date   A/V FISTULAGRAM N/A 03/28/2023   Procedure: A/V Fistulagram;  Surgeon: Patrick Boor, MD;  Location: [facility] INVASIVE CV LAB;  Service: Cardiovascular;  Laterality: N/A;   AV FISTULA PLACEMENT Left ?2010   "forearm; at [facility]"   BACK SURGERY  CARDIAC CATHETERIZATION  03/21/2011   CHOLECYSTECTOMY N/A 10/21/2014   Procedure: LAPAROSCOPIC CHOLECYSTECTOMY WITH INTRAOPERATIVE CHOLANGIOGRAM;  Surgeon: Lillette Reid III, MD;  Location: [facility] OR;  Service: General;  Laterality: N/A;   COLONOSCOPY     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   IR ANGIO INTRA EXTRACRAN SEL INTERNAL CAROTID UNI R MOD SED  11/30/2021   IR ANGIOGRAM FOLLOW UP STUDY  11/30/2021   IR AV DIALY SHUNT INTRO NEEDLE/INTRACATH INITIAL W/PTA/IMG LEFT  12/13/2022   IR BONE MARROW BIOPSY & ASPIRATION  03/27/2022   IR NEURO EACH ADD'L AFTER BASIC UNI RIGHT (MS)  11/30/2021   IR TRANSCATH/EMBOLIZ  11/30/2021   IR US  GUIDE VASC ACCESS LEFT  12/13/2022   KIDNEY TRANSPLANT  08/17/2011   [facility]    LAPAROSCOPIC CHOLECYSTECTOMY  10/21/2014   w/IOC   LEFT HEART CATH AND CORONARY ANGIOGRAPHY N/A 04/09/2023   Procedure: LEFT HEART CATH AND CORONARY ANGIOGRAPHY;  Surgeon: Arleen Lacer, MD;  Location: [facility] INVASIVE CV LAB;  Service: Cardiovascular;  Laterality: N/A;   LEFT HEART CATHETERIZATION WITH CORONARY ANGIOGRAM N/A 03/21/2011   Procedure: LEFT HEART CATHETERIZATION WITH CORONARY ANGIOGRAM;  Surgeon: Hazle Lites, MD;  Location: [facility] CATH LAB;  Service: Cardiovascular;  Laterality: N/A;   NEPHRECTOMY  08/2013   removed transplaned kidney   PERIPHERAL VASCULAR BALLOON ANGIOPLASTY Left 03/28/2023   Procedure: PERIPHERAL VASCULAR BALLOON ANGIOPLASTY;  Surgeon: Patrick Boor, MD;  Location: [facility] INVASIVE CV LAB;  Service:  Cardiovascular;  Laterality: Left;  80% outflow cephalic   POLYPECTOMY     POSTERIOR FUSION CERVICAL SPINE  06/25/2012   for spinal stenosis   RADIOLOGY WITH ANESTHESIA N/A 11/30/2021   Procedure: RIGHT MIDDLE MENINGEAL ARTERY EMBOLIZATION;  Surgeon: Radiologist, Medication, MD;  Location: [facility] OR;  Service: Radiology;  Laterality: N/A;   VASECTOMY  2010    Family History  Adopted: Yes  Problem Relation Age of Onset   Colon cancer Neg Hx    Esophageal cancer Neg Hx    Rectal cancer Neg Hx    Stomach cancer Neg Hx    Colon polyps Neg Hx     Prior to Admission medications   Medication Sig Start Date End Date Taking? Authorizing Provider  acetaminophen  (TYLENOL ) 500 MG tablet Take 1 tablet (500 mg total) by mouth every 6 (six) hours as needed for moderate pain. Patient taking differently: Take 1,000 mg by mouth every 6 (six) hours as needed for moderate pain (pain score 4-6) or headache. 09/12/14  Yes Lonita Roach, MD  albuterol  (VENTOLIN  HFA) 108 (90 Base) MCG/ACT inhaler Inhale 2 puffs into the lungs every 6 (six) hours as needed for wheezing or shortness of breath. 10/29/22  Yes Diamond Formica, MD  amLODipine  (NORVASC ) 10 MG tablet TAKE 1 TABLET BY MOUTH EVERY DAY Patient taking differently: Take 10 mg by mouth See admin instructions. Take 1 tablet (10mg ) by mouth once daily in the afternoon. 12/05/22  Yes Ngetich, Dinah C, NP  aspirin  EC 81 MG tablet Take 81 mg by mouth daily in the afternoon.   Yes [provider]  Cholecalciferol  (VITAMIN D3) 25 MCG (1000 UT) CAPS Take 4,000 Units by mouth daily.   Yes [provider]  doxazosin  (CARDURA ) 4 MG tablet Take 1 tablet (4 mg total) by mouth at bedtime. 05/31/23  Yes Clearnce Curia, NP  DULoxetine  (CYMBALTA ) 60 MG capsule Take 60 mg by mouth in the morning.   Yes [provider]  guaiFENesin  (MUCINEX ) 600 MG 12 hr tablet Take 1,200 mg by mouth 2 (two) times daily as needed for cough or to loosen phlegm.   Yes  [provider]  hydrALAZINE  (APRESOLINE ) 50 MG tablet Take 1 tablet (50 mg total) by mouth 3 (three) times daily. Patient taking differently: Take 50 mg by mouth See admin instructions. Take 1 tablet (50mg ) by mouth three times daily if sBP > 140, otherwise skip dose. 05/31/23  Yes Clearnce Curia, NP  isosorbide  mononitrate (IMDUR ) 60 MG 24 hr tablet Take 60 mg by mouth daily. 10/04/22  Yes [provider]  metoprolol  tartrate (LOPRESSOR ) 50 MG tablet Take 1 tablet (50 mg total) by mouth 2 (two) times daily. Patient taking differently: Take 50 mg by mouth See admin instructions. Take 1 tablet (50mg ) by mouth twice daily, in the afternoon and at bedtime. 05/31/23  Yes Clearnce Curia, NP  multivitamin (RENA-VIT) TABS tablet Take 1 tablet by mouth daily. Patient taking differently: Take 1 tablet by mouth See admin instructions. Take 1 tablet by mouth once daily in the afternoon. 01/29/23  Yes Ngetich, Dinah C, NP  pravastatin  (PRAVACHOL ) 40 MG tablet TAKE 1 TABLET BY MOUTH EVERY DAY IN THE EVENING Patient taking differently: Take 40 mg by mouth at bedtime. TAKE 1 TABLET BY MOUTH EVERY DAY IN THE EVENING 12/04/22  Yes Ngetich, Dinah C, NP  sevelamer  carbonate (RENVELA ) 2.4 g PACK Take 4.8 g by mouth 3 (three) times daily with meals.   Yes [provider]  traMADol  (ULTRAM ) 50 MG tablet Take 50 mg by mouth daily as needed for severe pain (pain score 7-10).   Yes [provider]  traZODone  (DESYREL ) 50 MG tablet TAKE 0.5-1 TABLET (25-50 MG TOTAL) BY MOUTH AT BEDTIME AS NEEDED. FOR SLEEP Patient taking differently: Take 50 mg by mouth at bedtime. 04/08/23  Yes Ngetich, Dinah C, NP  UNABLE TO FIND Inhale 1 Device into the lungs at bedtime. CPAP   Yes [provider]  VELPHORO  500 MG chewable tablet Chew 1,000-1,500 mg by mouth See admin instructions. Chew and swallow 3 tablets (1500mg ) by mouth three times daily with meals and take 2 tablets (1000mg ) with snacks.  02/06/23  Yes [provider]  albuterol  (PROVENTIL ) (2.5 MG/3ML) 0.083% nebulizer solution Take 3 mLs (2.5 mg total) by nebulization every 6 (six) hours as needed for wheezing or shortness of breath. Patient not taking: Reported on 06/19/2023 11/13/22   Antonio Baumgarten, NP  fluticasone  furoate-vilanterol (BREO ELLIPTA ) 200-25 MCG/ACT AEPB Inhale 1 puff into the lungs in the morning and at bedtime. Patient not taking: Reported on 06/19/2023 03/23/23   Shitarev, Dimitry, MD  losartan  (COZAAR ) 25 MG tablet Take 25 mg by mouth daily. Patient not taking: Reported on 06/19/2023    [provider]  umeclidinium bromide  (INCRUSE ELLIPTA ) 62.5 MCG/ACT AEPB Inhale 1 puff into the lungs daily. Patient not taking: Reported on 04/22/2023 03/24/23   Homer Lust, MD    Current Facility-Administered Medications  Medication Dose Route Frequency Provider Last Rate Last Admin   0.9 %  sodium chloride  infusion  250 mL Intravenous PRN Wouk, Haynes Lips, MD       amLODipine  (NORVASC ) tablet 10 mg  10 mg Oral Daily Wouk, Haynes Lips, MD   10 mg at 06/19/23 1955   Chlorhexidine  Gluconate Cloth 2 % PADS 6 each  6 each Topical Q0600 Wouk, Haynes Lips, MD       doxazosin  (CARDURA ) tablet 4 mg  4  mg Oral QHS Janeane Mealy, MD   4 mg at 06/19/23 2135   DULoxetine  (CYMBALTA ) DR capsule 60 mg  60 mg Oral q AM Janeane Mealy, MD   60 mg at 06/20/23 0617   ipratropium-albuterol  (DUONEB) 0.5-2.5 (3) MG/3ML nebulizer solution 3 mL  3 mL Nebulization Q6H Wouk, Haynes Lips, MD   3 mL at 06/20/23 0756   isosorbide  mononitrate (IMDUR ) 24 hr tablet 60 mg  60 mg Oral q AM Janeane Mealy, MD   60 mg at 06/20/23 0617   methylPREDNISolone  sodium succinate (SOLU-MEDROL ) 40 mg/mL injection 40 mg  40 mg Intravenous Q12H Janeane Mealy, MD   40 mg at 06/20/23 0514   metoprolol  tartrate (LOPRESSOR ) tablet 50 mg  50 mg Oral BID Janeane Mealy, MD   50 mg at 06/19/23 2134   pantoprazole  (PROTONIX )  injection 40 mg  40 mg Intravenous Q12H Janeane Mealy, MD   40 mg at 06/19/23 1952   sodium chloride  flush (NS) 0.9 % injection 3 mL  3 mL Intravenous Q12H Wouk, Haynes Lips, MD       sodium chloride  flush (NS) 0.9 % injection 3 mL  3 mL Intravenous PRN Wouk, Haynes Lips, MD       Current Outpatient Medications  Medication Sig Dispense Refill   acetaminophen  (TYLENOL ) 500 MG tablet Take 1 tablet (500 mg total) by mouth every 6 (six) hours as needed for moderate pain. (Patient taking differently: Take 1,000 mg by mouth every 6 (six) hours as needed for moderate pain (pain score 4-6) or headache.)     albuterol  (VENTOLIN  HFA) 108 (90 Base) MCG/ACT inhaler Inhale 2 puffs into the lungs every 6 (six) hours as needed for wheezing or shortness of breath. 8 g 6   amLODipine  (NORVASC ) 10 MG tablet TAKE 1 TABLET BY MOUTH EVERY DAY (Patient taking differently: Take 10 mg by mouth See admin instructions. Take 1 tablet (10mg ) by mouth once daily in the afternoon.) 90 tablet 3   aspirin  EC 81 MG tablet Take 81 mg by mouth daily in the afternoon.     Cholecalciferol  (VITAMIN D3) 25 MCG (1000 UT) CAPS Take 4,000 Units by mouth daily.     doxazosin  (CARDURA ) 4 MG tablet Take 1 tablet (4 mg total) by mouth at bedtime. 90 tablet 3   DULoxetine  (CYMBALTA ) 60 MG capsule Take 60 mg by mouth in the morning.     guaiFENesin  (MUCINEX ) 600 MG 12 hr tablet Take 1,200 mg by mouth 2 (two) times daily as needed for cough or to loosen phlegm.     hydrALAZINE  (APRESOLINE ) 50 MG tablet Take 1 tablet (50 mg total) by mouth 3 (three) times daily. (Patient taking differently: Take 50 mg by mouth See admin instructions. Take 1 tablet (50mg ) by mouth three times daily if sBP > 140, otherwise skip dose.) 270 tablet 3   isosorbide  mononitrate (IMDUR ) 60 MG 24 hr tablet Take 60 mg by mouth daily.     metoprolol  tartrate (LOPRESSOR ) 50 MG tablet Take 1 tablet (50 mg total) by mouth 2 (two) times daily. (Patient taking differently:  Take 50 mg by mouth See admin instructions. Take 1 tablet (50mg ) by mouth twice daily, in the afternoon and at bedtime.) 180 tablet 3   multivitamin (RENA-VIT) TABS tablet Take 1 tablet by mouth daily. (Patient taking differently: Take 1 tablet by mouth See admin instructions. Take 1 tablet by mouth once daily in the afternoon.) 90 tablet 1   pravastatin  (  PRAVACHOL ) 40 MG tablet TAKE 1 TABLET BY MOUTH EVERY DAY IN THE EVENING (Patient taking differently: Take 40 mg by mouth at bedtime. TAKE 1 TABLET BY MOUTH EVERY DAY IN THE EVENING) 90 tablet 2   sevelamer  carbonate (RENVELA ) 2.4 g PACK Take 4.8 g by mouth 3 (three) times daily with meals.     traMADol  (ULTRAM ) 50 MG tablet Take 50 mg by mouth daily as needed for severe pain (pain score 7-10).     traZODone  (DESYREL ) 50 MG tablet TAKE 0.5-1 TABLET (25-50 MG TOTAL) BY MOUTH AT BEDTIME AS NEEDED. FOR SLEEP (Patient taking differently: Take 50 mg by mouth at bedtime.) 90 tablet 1   UNABLE TO FIND Inhale 1 Device into the lungs at bedtime. CPAP     VELPHORO  500 MG chewable tablet Chew 1,000-1,500 mg by mouth See admin instructions. Chew and swallow 3 tablets (1500mg ) by mouth three times daily with meals and take 2 tablets (1000mg ) with snacks.     albuterol  (PROVENTIL ) (2.5 MG/3ML) 0.083% nebulizer solution Take 3 mLs (2.5 mg total) by nebulization every 6 (six) hours as needed for wheezing or shortness of breath. (Patient not taking: Reported on 06/19/2023) 75 mL 1   fluticasone  furoate-vilanterol (BREO ELLIPTA ) 200-25 MCG/ACT AEPB Inhale 1 puff into the lungs in the morning and at bedtime. (Patient not taking: Reported on 06/19/2023) 60 each 0   losartan  (COZAAR ) 25 MG tablet Take 25 mg by mouth daily. (Patient not taking: Reported on 06/19/2023)     umeclidinium bromide  (INCRUSE ELLIPTA ) 62.5 MCG/ACT AEPB Inhale 1 puff into the lungs daily. (Patient not taking: Reported on 04/22/2023) 30 each 0   Facility-Administered Medications Ordered in Other  Encounters  Medication Dose Route Frequency Provider Last Rate Last Admin   sodium chloride  flush (NS) 0.9 % injection 10 mL  10 mL Intravenous Q12H Geary Kells, PA-C        Allergies as of 06/19/2023 - Review Complete 06/19/2023  Allergen Reaction Noted   Codeine Nausea And Vomiting 05/20/2007   Hydrocodone -acetaminophen  Nausea And Vomiting 11/01/2020    Social History   Socioeconomic History   Marital status: Married    Spouse name: Editor, commissioning   Number of children: 3   Years of education: Not on file   Highest education level: Not on file  Occupational History   Occupation: disabled    Employer: DISABLED  Tobacco Use   Smoking status: Former    Current packs/day: 0.00    Average packs/day: 0.5 packs/day for 32.0 years (16.0 ttl pk-yrs)    Types: Cigarettes    Start date: 89    Quit date: 02/24/2020    Years since quitting: 3.3   Smokeless tobacco: Never  Vaping Use   Vaping status: Never Used  Substance and Sexual Activity   Alcohol use: No    Alcohol/week: 0.0 standard drinks of alcohol   Drug use: No   Sexual activity: Yes  Other Topics Concern   Not on file  Social History Narrative   Drinks 1 cup of caffeine daily.   Social Drivers of Health   Financial Resource Strain: Medium Risk (11/02/2021)   Overall Financial Resource Strain (CARDIA)    Difficulty of Paying Living Expenses: Somewhat hard  Food Insecurity: No Food Insecurity (03/17/2023)   Hunger Vital Sign    Worried About Running Out of Food in the Last Year: Never true    Ran Out of Food in the Last Year: Never true  Recent Concern: Food Insecurity - Food  Insecurity Present (12/18/2022)   Hunger Vital Sign    Worried About Running Out of Food in the Last Year: Sometimes true    Ran Out of Food in the Last Year: Sometimes true  Transportation Needs: No Transportation Needs (03/17/2023)   PRAPARE - Administrator, Civil Service (Medical): No    Lack of Transportation (Non-Medical): No   Physical Activity: Insufficiently Active (06/25/2017)   Exercise Vital Sign    Days of Exercise per Week: 3 days    Minutes of Exercise per Session: 20 min  Stress: No Stress Concern Present (06/25/2017)   [employer] of Occupational Health - Occupational Stress Questionnaire    Feeling of Stress : Only a little  Social Connections: Patient Declined (03/17/2023)   Social Connection and Isolation Panel [NHANES]    Frequency of Communication with Friends and Family: Patient declined    Frequency of Social Gatherings with Friends and Family: Patient declined    Attends Religious Services: Patient declined    Database administrator or Organizations: Patient declined    Attends Banker Meetings: Patient declined    Marital Status: Patient declined  Intimate Partner Violence: Not At Risk (03/17/2023)   Humiliation, Afraid, Rape, and Kick questionnaire    Fear of Current or Ex-Partner: No    Emotionally Abused: No    Physically Abused: No    Sexually Abused: No     Code Status   Code Status: Full Code  Review of Systems: All systems reviewed and negative except where noted in HPI.  Physical Exam: Vital signs in last 24 hours: Temp:  [97.1 F (36.2 C)-98.6 F (37 C)] 98.3 F (36.8 C) (05/29 0445) Pulse Rate:  [67-89] 71 (05/29 0645) Resp:  [14-26] 20 (05/29 0630) BP: (102-189)/(47-90) 146/76 (05/29 0645) SpO2:  [94 %-100 %] 99 % (05/29 0645) FiO2 (%):  [50 %] 50 % (05/28 1841)    General:  Pleasant male in NAD Psych:  Cooperative. Normal mood and affect Eyes: Pupils equal Ears:  Normal auditory acuity Nose: No deformity, discharge or lesions Neck:  Supple, no masses felt Lungs:  A few bibasilar crackles and occasional expiratory wheezing.   Heart:  Regular rate, regular rhythm.  Abdomen:  Soft, nondistended, nontender, active bowel sounds, no masses felt Rectal :  Deferred Msk: Symmetrical without gross deformities.  Neurologic:  Alert, oriented, grossly  normal neurologically Extremities : No edema Skin:  Intact without significant lesions.    Intake/Output from previous day: 05/28 0701 - 05/29 0700 In: 315 [Blood:315] Out: 2700  Intake/Output this shift:  No intake/output data recorded.   Mai Schwalbe, NP-C   06/20/2023, 8:33 AM  I have taken an interval history, thoroughly reviewed the chart and examined the patient. I agree with the Advanced Practitioner's note, impression and recommendations, and have recorded additional findings, impressions and recommendations below. I performed a substantive portion of this encounter (>50% time spent), including a complete performance of the medical decision making.  My additional thoughts are as follows:  Acute on chronic anemia with reported black stool, concern for slow upper GI bleeding in a dialysis patient.  Pancytopenia with previous outpatient hematology evaluation, cause unclear other than presumably due to multiple medical illnesses.  Previous bone marrow biopsy unrevealing for primary hematologic disorder.  Needs upper endoscopy, but he is dyspneic and wheezing today, probably still somewhat volume overloaded and perhaps have a flare of asthma/COPD.  He has had some respiratory treatments so far, may need more.  Needs dialysis again before endoscopy, so we communicated with nephrology to plan HD tomorrow morning and tentatively plan EGD tomorrow afternoon.  Patient agreeable after discussion of procedure and risks. Patient at increased risk for cardiopulmonary complications of procedure due to medical comorbidities.  Serial hemoglobin and hematocrit, PPI, liquid diet okay today, n.p.o. after midnight    Kerby Pearson III Office:[phone]

## 2023-06-21 ENCOUNTER — Inpatient Hospital Stay (HOSPITAL_COMMUNITY): Admitting: Anesthesiology

## 2023-06-21 ENCOUNTER — Encounter (HOSPITAL_COMMUNITY)
Admission: EM | Disposition: A | Discharge status: Home / Self Care | Payer: Self-pay | Source: Home / Self Care | Attending: Internal Medicine

## 2023-06-21 DIAGNOSIS — N186 End stage renal disease: Secondary | ICD-10-CM

## 2023-06-21 DIAGNOSIS — K921 Melena: Secondary | ICD-10-CM | POA: Diagnosis not present

## 2023-06-21 DIAGNOSIS — D649 Anemia, unspecified: Secondary | ICD-10-CM | POA: Diagnosis not present

## 2023-06-21 DIAGNOSIS — Z992 Dependence on renal dialysis: Secondary | ICD-10-CM | POA: Diagnosis not present

## 2023-06-21 DIAGNOSIS — R0603 Acute respiratory distress: Secondary | ICD-10-CM | POA: Diagnosis not present

## 2023-06-21 DIAGNOSIS — D509 Iron deficiency anemia, unspecified: Secondary | ICD-10-CM

## 2023-06-21 DIAGNOSIS — I120 Hypertensive chronic kidney disease with stage 5 chronic kidney disease or end stage renal disease: Secondary | ICD-10-CM | POA: Diagnosis not present

## 2023-06-21 DIAGNOSIS — R9389 Abnormal findings on diagnostic imaging of other specified body structures: Secondary | ICD-10-CM | POA: Diagnosis not present

## 2023-06-21 HISTORY — PX: ESOPHAGOGASTRODUODENOSCOPY: SHX5428

## 2023-06-21 LAB — CBC
HCT: 25.1 % — ABNORMAL LOW (ref 39.0–52.0)
Hemoglobin: 8.2 g/dL — ABNORMAL LOW (ref 13.0–17.0)
MCH: 32.4 pg (ref 26.0–34.0)
MCHC: 32.7 g/dL (ref 30.0–36.0)
MCV: 99.2 fL (ref 80.0–100.0)
Platelets: 73 10*3/uL — ABNORMAL LOW (ref 150–400)
RBC: 2.53 MIL/uL — ABNORMAL LOW (ref 4.22–5.81)
RDW: 14.5 % (ref 11.5–15.5)
WBC: 3.3 10*3/uL — ABNORMAL LOW (ref 4.0–10.5)
nRBC: 0 % (ref 0.0–0.2)

## 2023-06-21 LAB — RENAL FUNCTION PANEL
Albumin: 3.5 g/dL (ref 3.5–5.0)
Anion gap: 17 — ABNORMAL HIGH (ref 5–15)
BUN: 50 mg/dL — ABNORMAL HIGH (ref 8–23)
CO2: 25 mmol/L (ref 22–32)
Calcium: 9 mg/dL (ref 8.9–10.3)
Chloride: 91 mmol/L — ABNORMAL LOW (ref 98–111)
Creatinine, Ser: 8.78 mg/dL — ABNORMAL HIGH (ref 0.61–1.24)
GFR, Estimated: 6 mL/min — ABNORMAL LOW (ref >60)
Glucose, Bld: 154 mg/dL — ABNORMAL HIGH (ref 70–99)
Phosphorus: 5.6 mg/dL — ABNORMAL HIGH (ref 2.5–4.6)
Potassium: 4.3 mmol/L (ref 3.5–5.1)
Sodium: 133 mmol/L — ABNORMAL LOW (ref 135–145)

## 2023-06-21 LAB — GLUCOSE, CAPILLARY: Glucose-Capillary: 152 mg/dL — ABNORMAL HIGH (ref 70–99)

## 2023-06-21 LAB — HEPATITIS B SURFACE ANTIBODY, QUANTITATIVE: Hep B S AB Quant (Post): 18.2 m[IU]/mL

## 2023-06-21 SURGERY — EGD (ESOPHAGOGASTRODUODENOSCOPY)
Anesthesia: Monitor Anesthesia Care

## 2023-06-21 MED ORDER — ETOMIDATE 2 MG/ML IV SOLN
INTRAVENOUS | Status: DC | PRN
  Administered 2023-06-21 (×3): 2 mg via INTRAVENOUS

## 2023-06-21 MED ORDER — PROPOFOL 500 MG/50ML IV EMUL
INTRAVENOUS | Status: DC | PRN
  Administered 2023-06-21: 80 ug/kg/min via INTRAVENOUS

## 2023-06-21 MED ORDER — SODIUM CHLORIDE 0.9 % IV SOLN
INTRAVENOUS | Status: AC | PRN
  Administered 2023-06-21: 250 mL via INTRAMUSCULAR

## 2023-06-21 MED ORDER — MELATONIN 3 MG PO TABS
3.0000 mg | ORAL_TABLET | Freq: Every evening | ORAL | Status: DC | PRN
  Administered 2023-06-21: 3 mg via ORAL
  Filled 2023-06-21: qty 1

## 2023-06-21 MED ORDER — PHENYLEPHRINE 80 MCG/ML (10ML) SYRINGE FOR IV PUSH (FOR BLOOD PRESSURE SUPPORT)
PREFILLED_SYRINGE | INTRAVENOUS | Status: DC | PRN
  Administered 2023-06-21: 120 ug via INTRAVENOUS

## 2023-06-21 MED ORDER — LIDOCAINE HCL (CARDIAC) PF 100 MG/5ML IV SOSY
PREFILLED_SYRINGE | INTRAVENOUS | Status: DC | PRN
  Administered 2023-06-21: 50 mg via INTRAVENOUS

## 2023-06-21 MED ORDER — ALBUTEROL SULFATE (2.5 MG/3ML) 0.083% IN NEBU
2.5000 mg | INHALATION_SOLUTION | RESPIRATORY_TRACT | Status: DC | PRN
  Administered 2023-06-21 – 2023-06-25 (×2): 2.5 mg via RESPIRATORY_TRACT
  Filled 2023-06-21 (×2): qty 3

## 2023-06-21 MED ORDER — PROPOFOL 10 MG/ML IV BOLUS
INTRAVENOUS | Status: DC | PRN
  Administered 2023-06-21 (×4): 20 mg via INTRAVENOUS

## 2023-06-21 MED ORDER — CYANOCOBALAMIN 1000 MCG/ML IJ SOLN
1000.0000 ug | Freq: Every day | INTRAMUSCULAR | Status: DC
  Administered 2023-06-21: 1000 ug via SUBCUTANEOUS
  Filled 2023-06-21: qty 1

## 2023-06-21 MED ORDER — SEVELAMER CARBONATE 2.4 G PO PACK
4.8000 g | PACK | Freq: Three times a day (TID) | ORAL | Status: DC
  Administered 2023-06-21 – 2023-06-25 (×11): 4.8 g via ORAL
  Filled 2023-06-21 (×14): qty 2

## 2023-06-21 NOTE — Progress Notes (Signed)
   06/21/23 1218  Vitals  Temp 98 F (36.7 C)  Pulse Rate 74  Resp (!) 24  BP (!) 148/64  SpO2 96 %  O2 Device Nasal Cannula  Weight 82.5 kg  Type of Weight Post-Dialysis  Oxygen  Therapy  O2 Flow Rate (L/min) 5 L/min  Patient Activity (if Appropriate) In bed  Pulse Oximetry Type Continuous  Oximetry Probe Site Changed No  Post Treatment  Dialyzer Clearance Lightly streaked  Hemodialysis Intake (mL) 0 mL  Liters Processed 82.7  Fluid Removed (mL) 3000 mL  Tolerated HD Treatment Yes  AVG/AVF Arterial Site Held (minutes) 10 minutes  AVG/AVF Venous Site Held (minutes) 10 minutes   Received patient in bed to unit.  Alert and oriented.  Informed consent signed and in chart.   TX duration:3.5hrs  Patient tolerated well.  Transported back to the room  Alert, without acute distress.  Hand-off given to patient's nurse.   Access used: LAVF Access issues: none  Total UF removed: 3L Medication(s) given: none    [name]

## 2023-06-21 NOTE — Plan of Care (Signed)
  Problem: Education: Goal: Knowledge of General Education information will improve Description: Including pain rating scale, medication(s)/side effects and non-pharmacologic comfort measures 06/21/2023 0300 by Lolita Rise, RN Outcome: Progressing 06/20/2023 2100 by Lolita Rise, RN Outcome: Progressing   Problem: Health Behavior/Discharge Planning: Goal: Ability to manage health-related needs will improve 06/21/2023 0300 by Lolita Rise, RN Outcome: Progressing 06/20/2023 2100 by Lolita Rise, RN Outcome: Progressing   Problem: Clinical Measurements: Goal: Ability to maintain clinical measurements within normal limits will improve 06/21/2023 0300 by Lolita Rise, RN Outcome: Progressing 06/20/2023 2100 by Lolita Rise, RN Outcome: Progressing Goal: Will remain free from infection 06/21/2023 0300 by Lolita Rise, RN Outcome: Progressing 06/20/2023 2100 by Lolita Rise, RN Outcome: Progressing Goal: Diagnostic test results will improve 06/21/2023 0300 by Lolita Rise, RN Outcome: Progressing 06/20/2023 2100 by Lolita Rise, RN Outcome: Progressing Goal: Respiratory complications will improve 06/21/2023 0300 by Lolita Rise, RN Outcome: Progressing 06/20/2023 2100 by Lolita Rise, RN Outcome: Progressing Goal: Cardiovascular complication will be avoided 06/21/2023 0300 by Lolita Rise, RN Outcome: Progressing 06/20/2023 2100 by Lolita Rise, RN Outcome: Progressing   Problem: Activity: Goal: Risk for activity intolerance will decrease 06/21/2023 0300 by Lolita Rise, RN Outcome: Progressing 06/20/2023 2100 by Lolita Rise, RN Outcome: Progressing   Problem: Nutrition: Goal: Adequate nutrition will be maintained 06/21/2023 0300 by Lolita Rise, RN Outcome: Progressing 06/20/2023 2100 by Lolita Rise, RN Outcome: Progressing   Problem: Coping: Goal: Level of anxiety will  decrease 06/21/2023 0300 by Lolita Rise, RN Outcome: Progressing 06/20/2023 2100 by Lolita Rise, RN Outcome: Progressing   Problem: Elimination: Goal: Will not experience complications related to bowel motility 06/21/2023 0300 by Lolita Rise, RN Outcome: Progressing 06/20/2023 2100 by Lolita Rise, RN Outcome: Progressing Goal: Will not experience complications related to urinary retention 06/21/2023 0300 by Lolita Rise, RN Outcome: Progressing 06/20/2023 2100 by Lolita Rise, RN Outcome: Progressing   Problem: Pain Managment: Goal: General experience of comfort will improve and/or be controlled 06/21/2023 0300 by Lolita Rise, RN Outcome: Progressing 06/20/2023 2100 by Lolita Rise, RN Outcome: Progressing   Problem: Safety: Goal: Ability to remain free from injury will improve 06/21/2023 0300 by Lolita Rise, RN Outcome: Progressing 06/20/2023 2100 by Lolita Rise, RN Outcome: Progressing   Problem: Skin Integrity: Goal: Risk for impaired skin integrity will decrease 06/21/2023 0300 by Lolita Rise, RN Outcome: Progressing 06/20/2023 2100 by Lolita Rise, RN Outcome: Progressing

## 2023-06-21 NOTE — Progress Notes (Signed)
  [facility] Progress Note  Subjective:  Seen in room Had CPAP on all night States had 2 spells where he "couldn't catch my breath"  Vitals:   06/21/23 1207 06/21/23 1215 06/21/23 1218 06/21/23 1303  BP: (!) 151/68 (!) 151/69 (!) 148/64 139/66  Pulse: 73 72 74 75  Resp:  (!) 24 (!) 24 (!) 22  Temp:   98 F (36.7 C) 97.9 F (36.6 C)  TempSrc:    Axillary  SpO2: 96% 95% 96% 93%  Weight:   82.5 kg     Exam: Gen alert, no distress, 2-5L [place] O2 No jvd or bruits Chest clear bilat today RRR no MRG Abd soft ntnd no mass or ascites +bs Ext no sig LE or UE edema, no other edema Neuro is alert, Ox 3 , nf    LUA AVF+bruit      Renal-related home meds: Renvela  6 ac tid Losartan  25 every day Norvasc  10 every day Doxazosin  4 mg hs Hydralazine  50 tid Lopressor  50 bid Renavite every day Velphoro  3 ac tid       OP HD: MWF G-O 4h   B400   82.7kg  L AVF   Heparin  2500 Last OP HD 5/28, post wt 84.3kg Usually gets to dry wt, but wt gains are typically 3-4 kg   CXR 5/28 - vasc congestion and mild IS edema      Assessment/ Plan: Acute hypoxic resp failure/ pulm edema: CXR +vasc congestion, mild IS edema. Suspected volume overload +/- other. Had HD 5/29 am overnight w/ 2.7 L removed. Looks better this morning than post- HD yesterday. On HD this am, max UF 3 L w/ HD today. Follow clinically.  Acute rhinovirus infection Acute on chronic anemia: w/ dark stools x 1 week. Hb improved sp prbc's up to 8.5.  ESRD: HD MWF. HD today 1st shift.  HTN: bp's wnl today. Cont current regimen.  Secondary hyperparathyroidism: Ca in range, cont binders   Larry Poag MD  [facility] 06/21/2023, 1:11 PM  Recent Labs  Lab 06/20/23 0426 06/20/23 1742 06/21/23 0833  HGB 8.5*  --  8.2*  ALBUMIN   --  3.5 3.5  CALCIUM  8.7*  --  9.0  PHOS  --  5.0* 5.6*  CREATININE 5.53*  --  8.78*  K 4.0  --  4.3   No results for input(s): "IRON", "TIBC", "FERRITIN" in the last 168 hours. Inpatient  medications:  amLODipine   10 mg Oral Daily   Chlorhexidine  Gluconate Cloth  6 each Topical Q0600   doxazosin   4 mg Oral QHS   DULoxetine   60 mg Oral q AM   ipratropium-albuterol   3 mL Nebulization Q6H   isosorbide  mononitrate  60 mg Oral q AM   methylPREDNISolone  (SOLU-MEDROL ) injection  40 mg Intravenous Q12H   metoprolol  tartrate  50 mg Oral BID   pantoprazole  (PROTONIX ) IV  40 mg Intravenous Q12H   sodium chloride  flush  3 mL Intravenous Q12H    sodium chloride      albuterol , guaiFENesin , melatonin, sodium chloride  flush

## 2023-06-21 NOTE — Anesthesia Postprocedure Evaluation (Signed)
 Anesthesia Post Note  Patient: Kirk [name]  Procedure(s) Performed: EGD (ESOPHAGOGASTRODUODENOSCOPY)     Patient location during evaluation: Endoscopy Anesthesia Type: MAC Level of consciousness: awake and alert Pain management: pain level controlled Vital Signs Assessment: post-procedure vital signs reviewed and stable Respiratory status: spontaneous breathing, nonlabored ventilation, respiratory function stable and patient connected to nasal cannula oxygen  Cardiovascular status: blood pressure returned to baseline and stable Postop Assessment: no apparent nausea or vomiting Anesthetic complications: no  No notable events documented.  Last Vitals:  Vitals:   06/21/23 1620 06/21/23 1623  BP: 136/67 (!) 132/58  Pulse: 78 77  Resp: (!) 27 (!) 22  Temp:    SpO2: 96% 97%    Last Pain:  Vitals:   06/21/23 1620  TempSrc:   PainSc: 0-No pain                 Rosalita Combe

## 2023-06-21 NOTE — Plan of Care (Signed)
  Problem: Education: Goal: Knowledge of General Education information will improve Description: Including pain rating scale, medication(s)/side effects and non-pharmacologic comfort measures Outcome: Progressing   Problem: Health Behavior/Discharge Planning: Goal: Ability to manage health-related needs will improve Outcome: Progressing   Problem: Clinical Measurements: Goal: Ability to maintain clinical measurements within normal limits will improve Outcome: Progressing   Problem: Nutrition: Goal: Adequate nutrition will be maintained Outcome: Progressing   Problem: Coping: Goal: Level of anxiety will decrease Outcome: Progressing   Problem: Elimination: Goal: Will not experience complications related to bowel motility Outcome: Progressing Goal: Will not experience complications related to urinary retention Outcome: Progressing

## 2023-06-21 NOTE — Progress Notes (Addendum)
 Tele order renewed for another 48 hrs by hospitalist.

## 2023-06-21 NOTE — Progress Notes (Signed)
 PROGRESS NOTE    Kirk [name]  ZOX:[id number] DOB: [birth year] DOA: 06/19/2023 PCP: Estil Heman, NP   Brief Narrative:   Kirk [name] is a 63 y.o. male with medical history significant for hfref, copd, esrd on mwf dialysis, htn, osa on cpap, a fib not anticoagulated, who presents with the above.   Reports feeling ill for several days, feeling short of breath, at rest but worse with ambulation. Some cough as well, no fevers. No vomiting or diarrhea but has had a week of black stool. Is on asa, denies nsaids.  Patient presents from HD center during dialysis session due to dyspnea, increased work of breathing, requiring BiPAP on presentation, his workup significant for volume overload, COPD/probation and anemia .  Assessment & Plan:   Principal Problem:   Respiratory distress Active Problems:   OSA on CPAP   Essential hypertension   Alports syndrome   Obesity   Type 2 diabetes mellitus with diabetic nephropathy, without long-term current use of insulin  (HCC)   SCC (squamous cell carcinoma), arm   Narcotic drug use   ESRD (end stage renal disease) (HCC)   Chronic systolic heart failure (HCC)   Stage IV carcinoma of breast (HCC)   COPD (chronic obstructive pulmonary disease) (HCC)   PAF (paroxysmal atrial fibrillation) (HCC)   Anemia due to chronic blood loss   Melena  Acute hypoxic respiratory failure Acute COPD exacerbation Symptomatic anemia Acute on chronic systolic CHF Rhinovirus infection -Patient presents with acute respiratory failure with hypoxia, initially requiring BiPAP, currently on oxygen , this morning on 3 L oxygen , he is on room air at baseline . - Rest use incentive spirometry and flutter valve . - Significant wheezing, continue with IV steroids . - Volume management with hemodialysis . - Please see discussion below under anemia . - He was on CPAP overnight, significant episodes of desaturation once off CPAP and not on oxygen .   Symptomatic anemia   GI bleed  Anemia of chronic kidney disease  -He reports melena, hemoglobin 6.8 on presentation, responded well to transfusion, 8.5, labs pending today. - GI input greatly appreciated, continue with PPI twice daily, plan for endoscopy this afternoon  ESRD on HD:  -Dialysis per nephrology.  He is going again for HD this morning  Acute on chronic systolic CHF  - EF 35% on recent echo.  Chest x-ray with vascular congestion.  Volume management per dialysis.  Hypokalemia:  -Corrected with dialysis.  Essential hypertension:  - Blood pressure controlled, continue home medications which includes amlodipine , Lopressor  and Imdur .  Type 2 diabetes mellitus: Diet controlled.  Continue SSI.  History of OSA: CPAP at night.  History of A-fib: Not anticoagulated per cardiology.  He will be high risk in the setting of thrombocytopenia, and possible GI bleed currently normal sinus rhythm  History of pancytopenia:  Thrombocytopenia - This is at baseline  DVT prophylaxis: SCDs Start: 06/19/23 1750   Code Status: Full Code  Family Communication:  None present at bedside.  Plan of care discussed with patient in length and he/she verbalized understanding and agreed with it.  Status is: Inpatient Remains inpatient appropriate because: Still hypoxic, requires further workup and evaluation by GI.   Estimated body mass index is 28.33 kg/m as calculated from the following:   Height as of 04/22/23: 5\' 8"  (1.727 m).   Weight as of this encounter: 84.5 kg.    Nutritional Assessment: Body mass index is 28.33 kg/m.Kirk [name] Seen by dietician.  I agree with the assessment  and plan as outlined below: Nutrition Status:          Consultants:  GI nephrology Renal Procedures:  As above  Antimicrobials:  Anti-infectives (From admission, onward)    None         Subjective:  Patient with an episode of desaturation, dyspnea overnight when he took off his CPAP and tried to go to the restroom without  oxygen , is more comfortable currently on CPAP at bedtime and oxygen  daytime. .  Objective: Vitals:   06/21/23 0600 06/21/23 0734 06/21/23 0819 06/21/23 0830  BP:  (!) 172/121 (!) 158/76 (!) 155/75  Pulse: 65 86 65 65  Resp: (!) 23 (!) 23 20 (!) 23  Temp:  99 F (37.2 C) 97.6 F (36.4 C)   TempSrc:  Oral    SpO2: 93% 95% 97% 99%  Weight:   84.5 kg     Intake/Output Summary (Last 24 hours) at 06/21/2023 0859 Last data filed at 06/21/2023 0400 Gross per 24 hour  Intake 480 ml  Output --  Net 480 ml   Filed Weights   06/21/23 0819  Weight: 84.5 kg    Examination:  Awake Alert, Oriented X 3, No new F.N deficits, Normal affect Symmetrical Chest wall movement, decreased air entry at the bases, still with wheezing and crackles at the bases RRR,No Gallops,Rubs or new Murmurs, No Parasternal Heave +ve B.Sounds, Abd Soft, No tenderness, No rebound - guarding or rigidity. No Cyanosis, Clubbing, +1 edema, No new Rash or bruise     Data Reviewed: I have personally reviewed following labs and imaging studies  CBC: Recent Labs  Lab 06/19/23 1606 06/20/23 0426  WBC 2.3* 2.1*  NEUTROABS 1.4*  --   HGB 6.8* 8.5*  HCT 21.4* 26.5*  MCV 100.5* 98.9  PLT 74* 78*   Basic Metabolic Panel: Recent Labs  Lab 06/19/23 1606 06/20/23 0426 06/20/23 1742  NA 136 136  --   K 2.8* 4.0  --   CL 94* 95*  --   CO2 31 26  --   GLUCOSE 110* 148*  --   BUN 10 19  --   CREATININE 4.05* 5.53*  --   CALCIUM  8.1* 8.7*  --   PHOS  --   --  5.0*   GFR: Estimated Creatinine Clearance: 14.8 mL/min (A) (by C-G formula based on SCr of 5.53 mg/dL (H)). Liver Function Tests: Recent Labs  Lab 06/20/23 1742  ALBUMIN  3.5   No results for input(s): "LIPASE", "AMYLASE" in the last 168 hours. No results for input(s): "AMMONIA" in the last 168 hours. Coagulation Profile: No results for input(s): "INR", "PROTIME" in the last 168 hours. Cardiac Enzymes: No results for input(s): "CKTOTAL", "CKMB",  "CKMBINDEX", "TROPONINI" in the last 168 hours. BNP (last 3 results) No results for input(s): "PROBNP" in the last 8760 hours. HbA1C: No results for input(s): "HGBA1C" in the last 72 hours. CBG: Recent Labs  Lab 06/20/23 0823 06/21/23 0356  GLUCAP 150* 152*   Lipid Profile: No results for input(s): "CHOL", "HDL", "LDLCALC", "TRIG", "CHOLHDL", "LDLDIRECT" in the last 72 hours. Thyroid  Function Tests: No results for input(s): "TSH", "T4TOTAL", "FREET4", "T3FREE", "THYROIDAB" in the last 72 hours. Anemia Panel: No results for input(s): "VITAMINB12", "FOLATE", "FERRITIN", "TIBC", "IRON", "RETICCTPCT" in the last 72 hours. Sepsis Labs: No results for input(s): "PROCALCITON", "LATICACIDVEN" in the last 168 hours.  Recent Results (from the past 240 hours)  Resp panel by RT-PCR (RSV, Flu A&B, Covid) Anterior Nasal Swab  Status: None   Collection Time: 06/19/23  4:06 PM   Specimen: Anterior Nasal Swab  Result Value Ref Range Status   SARS Coronavirus 2 by RT PCR NEGATIVE NEGATIVE Final   Influenza A by PCR NEGATIVE NEGATIVE Final   Influenza B by PCR NEGATIVE NEGATIVE Final    Comment: (NOTE) The Xpert Xpress SARS-CoV-2/FLU/RSV plus assay is intended as an aid in the diagnosis of influenza from Nasopharyngeal swab specimens and should not be used as a sole basis for treatment. Nasal washings and aspirates are unacceptable for Xpert Xpress SARS-CoV-2/FLU/RSV testing.  Fact Sheet for Patients: BloggerCourse.com  Fact Sheet for Healthcare Providers: SeriousBroker.it  This test is not yet approved or cleared by the [place]  FDA and has been authorized for detection and/or diagnosis of SARS-CoV-2 by FDA under an Emergency Use Authorization (EUA). This EUA will remain in effect (meaning this test can be used) for the duration of the COVID-19 declaration under Section 564(b)(1) of the Act, 21 U.S.C. section 360bbb-3(b)(1), unless  the authorization is terminated or revoked.     Resp Syncytial Virus by PCR NEGATIVE NEGATIVE Final    Comment: (NOTE) Fact Sheet for Patients: BloggerCourse.com  Fact Sheet for Healthcare Providers: SeriousBroker.it  This test is not yet approved or cleared by the [place]  FDA and has been authorized for detection and/or diagnosis of SARS-CoV-2 by FDA under an Emergency Use Authorization (EUA). This EUA will remain in effect (meaning this test can be used) for the duration of the COVID-19 declaration under Section 564(b)(1) of the Act, 21 U.S.C. section 360bbb-3(b)(1), unless the authorization is terminated or revoked.  Performed at [facility], [address] [place], [place] [address]   Respiratory (~20 pathogens) panel by PCR     Status: Abnormal   Collection Time: 06/19/23  4:06 PM   Specimen: Nasopharyngeal Swab; Respiratory  Result Value Ref Range Status   Adenovirus NOT DETECTED NOT DETECTED Final   Coronavirus 229E NOT DETECTED NOT DETECTED Final    Comment: (NOTE) The Coronavirus on the Respiratory Panel, DOES NOT test for the novel  Coronavirus (2019 nCoV)    Coronavirus HKU1 NOT DETECTED NOT DETECTED Final   Coronavirus NL63 NOT DETECTED NOT DETECTED Final   Coronavirus OC43 NOT DETECTED NOT DETECTED Final   Metapneumovirus NOT DETECTED NOT DETECTED Final   Rhinovirus / Enterovirus DETECTED (A) NOT DETECTED Final   Influenza A NOT DETECTED NOT DETECTED Final   Influenza B NOT DETECTED NOT DETECTED Final   Parainfluenza Virus 1 NOT DETECTED NOT DETECTED Final   Parainfluenza Virus 2 NOT DETECTED NOT DETECTED Final   Parainfluenza Virus 3 NOT DETECTED NOT DETECTED Final   Parainfluenza Virus 4 NOT DETECTED NOT DETECTED Final   Respiratory Syncytial Virus NOT DETECTED NOT DETECTED Final   Bordetella pertussis NOT DETECTED NOT DETECTED Final   Bordetella Parapertussis NOT DETECTED NOT DETECTED Final    Chlamydophila pneumoniae NOT DETECTED NOT DETECTED Final   Mycoplasma pneumoniae NOT DETECTED NOT DETECTED Final    Comment: Performed at [facility], [address] [place], [place] [address]     Radiology Studies: DG Chest Port 1 View Result Date: 06/19/2023 CLINICAL DATA:  Short of breath. EXAM: PORTABLE CHEST 1 VIEW COMPARISON:  03/21/2023 and older exams. FINDINGS: Mild enlargement of the cardiac silhouette, stable. No mediastinal or hilar masses. Vascular congestion with bilateral prominent interstitial markings similar to the prior exam. No lung consolidation. No convincing pleural effusion and no pneumothorax. Stable changes from  a prior cervical spine fusion. Skeletal structures are grossly intact. IMPRESSION: 1. Cardiomegaly with vascular congestion and interstitial prominence similar to the prior chest radiograph. Consider mild chronic congestive heart failure. No evidence of pneumonia. Electronically Signed   By: Amanda Jungling M.D.   On: 06/19/2023 17:42    Scheduled Meds:  amLODipine   10 mg Oral Daily   Chlorhexidine  Gluconate Cloth  6 each Topical Q0600   doxazosin   4 mg Oral QHS   DULoxetine   60 mg Oral q AM   ipratropium-albuterol   3 mL Nebulization Q6H   isosorbide  mononitrate  60 mg Oral q AM   methylPREDNISolone  (SOLU-MEDROL ) injection  40 mg Intravenous Q12H   metoprolol  tartrate  50 mg Oral BID   pantoprazole  (PROTONIX ) IV  40 mg Intravenous Q12H   sodium chloride  flush  3 mL Intravenous Q12H   Continuous Infusions:  sodium chloride        LOS: 2 days   Seena Dadds, MD [facility]  06/21/2023, 8:59 AM   *Please note that this is a verbal dictation therefore any spelling or grammatical errors are due to the "Dragon Medical One" system interpretation.  Please page via Amion and do not message via secure chat for urgent patient care matters. Secure chat can be used for non urgent patient care matters.  How to contact the [facility] Attending or  Consulting provider 7A - 7P or covering provider during after hours 7P -7A, for this patient?  Check the care team in [facility] and look for a) attending/consulting [facility] provider listed and b) the [facility] team listed. Page or secure chat 7A-7P. Log into [email or web address] and use [place]'s universal password to access. If you do not have the password, please contact the hospital operator. Locate the [facility] provider you are looking for under [facility] and page to a number that you can be directly reached. If you still have difficulty reaching the provider, please page the [facility] (Director on Call) for the Hospitalists listed on amion for assistance.

## 2023-06-21 NOTE — Progress Notes (Signed)
 Patient took himself off CPAP to go emergently to RR. He did not put the O2 tubing on when he went. R.N. heard patient yelling and went to check on him. Patient gasping saying he couldn't breathe. R.N. called respiratory to come check the CPAP settings to ensure they were still correct and placed patient on 5L in the meantime. Patient very diaphoretic and slightly pale. R.N. messaged Dr. Brice Campi and made him aware. Gluc. 152. BP 134/78. HR 71 NSR. 93% on 5L. Dr. Kyung Pi said he as long as patient is back to baseline, he does not wish to implement any new orders. Patient now at baseline except slightly diaphoretic.

## 2023-06-21 NOTE — Anesthesia Preprocedure Evaluation (Signed)
 Anesthesia Evaluation  Patient identified by MRN, date of birth, ID band Patient awake    Reviewed: Allergy & Precautions, NPO status , Patient's Chart, lab work & pertinent test results  History of Anesthesia Complications (+) history of anesthetic complications  Airway Mallampati: I  TM Distance: >3 FB Neck ROM: Full    Dental no notable dental hx. (+) Edentulous Upper, Edentulous Lower   Pulmonary shortness of breath, asthma , sleep apnea and Continuous Positive Airway Pressure Ventilation , COPD, former smoker   Pulmonary exam normal breath sounds clear to auscultation       Cardiovascular hypertension, (-) angina (-) Past MI Normal cardiovascular exam Rhythm:Regular Rate:Normal     Neuro/Psych    GI/Hepatic ,GERD  ,,  Endo/Other    Renal/GU ESRF and DialysisRenal diseaseLab Results      Component                Value               Date                      NA                       133 (L)             06/21/2023                CL                       91 (L)              06/21/2023                K                        4.3                 06/21/2023                CO2                      25                  06/21/2023                BUN                      50 (H)              06/21/2023                CREATININE               8.78 (H)            06/21/2023              MWF Dialysis     Musculoskeletal   Abdominal   Peds  Hematology   Anesthesia Other Findings a  Reproductive/Obstetrics                             Anesthesia Physical Anesthesia Plan  ASA: 3  Anesthesia Plan:    Post-op Pain Management: Minimal or no pain anticipated   Induction:   PONV Risk Score and Plan: Treatment may vary due to age or  medical condition  Airway Management Planned: Nasal Cannula and Natural Airway  Additional Equipment: None  Intra-op Plan:   Post-operative Plan:   Informed  Consent: I have reviewed the patients History and Physical, chart, labs and discussed the procedure including the risks, benefits and alternatives for the proposed anesthesia with the patient or authorized representative who has indicated his/her understanding and acceptance.     Dental advisory given  Plan Discussed with: CRNA  Anesthesia Plan Comments:        Anesthesia Quick Evaluation

## 2023-06-21 NOTE — Interval H&P Note (Signed)
 History and Physical Interval Note:  06/21/2023 3:06 PM  Kirk [name]  has presented today for surgery, with the diagnosis of anemia , dark stools and abnormal barium swallow.  The various methods of treatment have been discussed with the patient and family. After consideration of risks, benefits and other options for treatment, the patient has consented to  Procedure(s): EGD (ESOPHAGOGASTRODUODENOSCOPY) (N/A) as a surgical intervention.  The patient's history has been reviewed, patient examined, no change in status, stable for surgery.  I have reviewed the patient's chart and labs.  Questions were answered to the patient's satisfaction.    Clinically stable.  Hgb stable from yesterday.  Had HD earlier today  Kerby Pearson III

## 2023-06-21 NOTE — Transfer of Care (Signed)
 Immediate Anesthesia Transfer of Care Note  Patient: Kirk [name]  Procedure(s) Performed: EGD (ESOPHAGOGASTRODUODENOSCOPY)  Patient Location: Endoscopy Unit  Anesthesia Type:MAC  Level of Consciousness: drowsy  Airway & Oxygen  Therapy: Patient Spontanous Breathing and Patient connected to nasal cannula oxygen   Post-op Assessment: Report given to RN and Post -op Vital signs reviewed and stable  Post vital signs: Reviewed and stable  Last Vitals:  Vitals Value Taken Time  BP 123/50 1605  Temp    Pulse 70   Resp 19   SpO2 98%     Last Pain:  Vitals:   06/21/23 1453  TempSrc: Temporal  PainSc: 4       Patients Stated Pain Goal: 0 (06/21/23 0734)  Complications: No notable events documented.

## 2023-06-21 NOTE — Progress Notes (Signed)
 Patient has an order for NS @ 20 mL/hr continuous infusion. It was supposed to start at 1700 on 05/29 but was not started on dayshift. It was ordered by GI in anticipation of EGD scheduled for 06/21/23. R.N. messaged Dr. Brice Campi and explained that pt. refused IVF since he is due to be dialyzed in the morning and it would be futile. Of note, patient is also on fluid restrictions. Dr. Brice Campi confirmed he is aware.

## 2023-06-21 NOTE — Op Note (Addendum)
 [facility] Patient Name: Kirk [name] Procedure Date : 06/21/2023 MRN: [id number] Attending MD: Roel Clarity. Dominic Friendly , MD, [id number] Date of Birth: 08-18-1960 CSN: [id number] Age: 63 Admit Type: Inpatient Procedure:                Upper GI endoscopy Indications:              Unexplained anemia, "dark stools" - dialysis                            patient with chronic pancytopenia and acute on                            chronic anemia                           Intermittent dysphagia, barium study several months                            ago suggesting possible ring or stricture in the                            distal esophagus Providers:                Ace Abu L. Dominic Friendly, MD, Bradley Caffey, Judith Novak,                            Technician Referring MD:             [facility] Medicines:                Monitored Anesthesia Care Complications:            No immediate complications. Estimated Blood Loss:     Estimated blood loss: none. Procedure:                Pre-Anesthesia Assessment:                           - Prior to the procedure, a History and Physical                            was performed, and patient medications and                            allergies were reviewed. The patient's tolerance of                            previous anesthesia was also reviewed. The risks                            and benefits of the procedure and the sedation                            options and risks were discussed with the patient.                            All questions were answered, and  informed consent                            was obtained. Prior Anticoagulants: The patient has                            taken no anticoagulant or antiplatelet agents. ASA                            Grade Assessment: IV - A patient with severe                            systemic disease that is a constant threat to life.                            After reviewing the risks and  benefits, the patient                            was deemed in satisfactory condition to undergo the                            procedure.                           After obtaining informed consent, the endoscope was                            passed under direct vision. Throughout the                            procedure, the patient's blood pressure, pulse, and                            oxygen  saturations were monitored continuously. The                            GIF-H190 ([id number]) Olympus endoscope was introduced                            through the mouth, and advanced to the third part                            of duodenum. The upper GI endoscopy was                            accomplished without difficulty. The patient                            tolerated the procedure well. Scope In: Scope Out: Findings:      The esophagus was normal. Specifically, no luminal abnormality,       resistance passing scope through LES, stricture, mass or mucosal       abnormality seen.      The stomach was normal.      The cardia and gastric fundus were  normal on retroflexion.      The examined duodenum was normal. Impression:               - Normal esophagus.                           - Normal stomach.                           - Normal examined duodenum.                           - No specimens collected. Recommendation:           - Return patient to hospital ward for ongoing care.                           - Resume regular (renal) diet.                           - Continue present medications.                           It is not clear to what extent GI bleeding is                            contributing to this patient's macrocytic anemia.                           - Monitor patient for signs of overt GI bleeding,                            as that might indicate need for colonoscopy. Last                            outpatient colonoscopy December 2021 with a                             diminutive polyp removed.                           This patient is anemia is multifactorial in the                            context of pancytopenia previously evaluated by                            hematology with extensive unrevealing workup                            including laboratory studies and a bone marrow                            biopsy.                           While the acute on chronic anemia was contributing  to this patient's worsening dyspnea, it persists                            despite posttransfusion improvement in his                            hemoglobin closer to his baseline. Additional                            testing and treatment for cardiopulmonary                            conditions warranted.                           No esophageal stricture seen to account for                            intermittent dysphagia and barium esophagram                            findings. Suspect dysmotility.                           Inpatient GI service signing off-call as needed. Procedure Code(s):        --- Professional ---                           [phone], Esophagogastroduodenoscopy, flexible,                            transoral; diagnostic, including collection of                            specimen(s) by brushing or washing, when performed                            (separate procedure) Diagnosis Code(s):        --- Professional ---                           D50.9, Iron deficiency anemia, unspecified CPT copyright 2022 American Medical Association. All rights reserved. The codes documented in this report are preliminary and upon coder review may  be revised to meet current compliance requirements. Nakeisha Greenhouse L. Dominic Friendly, MD 06/21/2023 4:08:56 PM This report has been signed electronically. Number of Addenda: 0

## 2023-06-22 DIAGNOSIS — R0603 Acute respiratory distress: Secondary | ICD-10-CM | POA: Diagnosis not present

## 2023-06-22 LAB — CBC
HCT: 30 % — ABNORMAL LOW (ref 39.0–52.0)
Hemoglobin: 9.7 g/dL — ABNORMAL LOW (ref 13.0–17.0)
MCH: 32.1 pg (ref 26.0–34.0)
MCHC: 32.3 g/dL (ref 30.0–36.0)
MCV: 99.3 fL (ref 80.0–100.0)
Platelets: 115 10*3/uL — ABNORMAL LOW (ref 150–400)
RBC: 3.02 MIL/uL — ABNORMAL LOW (ref 4.22–5.81)
RDW: 14.2 % (ref 11.5–15.5)
WBC: 5.2 10*3/uL (ref 4.0–10.5)
nRBC: 0 % (ref 0.0–0.2)

## 2023-06-22 LAB — BASIC METABOLIC PANEL WITH GFR
Anion gap: 16 — ABNORMAL HIGH (ref 5–15)
BUN: 41 mg/dL — ABNORMAL HIGH (ref 8–23)
CO2: 27 mmol/L (ref 22–32)
Calcium: 9.6 mg/dL (ref 8.9–10.3)
Chloride: 91 mmol/L — ABNORMAL LOW (ref 98–111)
Creatinine, Ser: 7.11 mg/dL — ABNORMAL HIGH (ref 0.61–1.24)
GFR, Estimated: 8 mL/min — ABNORMAL LOW (ref >60)
Glucose, Bld: 148 mg/dL — ABNORMAL HIGH (ref 70–99)
Potassium: 4.2 mmol/L (ref 3.5–5.1)
Sodium: 134 mmol/L — ABNORMAL LOW (ref 135–145)

## 2023-06-22 LAB — IRON AND TIBC
Iron: 102 ug/dL (ref 45–182)
Saturation Ratios: 39 % (ref 17.9–39.5)
TIBC: 265 ug/dL (ref 250–450)
UIBC: 163 ug/dL

## 2023-06-22 LAB — FOLATE: Folate: 10.8 ng/mL (ref >5.9)

## 2023-06-22 LAB — FERRITIN: Ferritin: 1481 ng/mL — ABNORMAL HIGH (ref 24–336)

## 2023-06-22 LAB — RETICULOCYTES
Immature Retic Fract: 18.6 % — ABNORMAL HIGH (ref 2.3–15.9)
RBC.: 3 MIL/uL — ABNORMAL LOW (ref 4.22–5.81)
Retic Count, Absolute: 54 10*3/uL (ref 19.0–186.0)
Retic Ct Pct: 1.8 % (ref 0.4–3.1)

## 2023-06-22 LAB — VITAMIN B12: Vitamin B-12: >7500 pg/mL — ABNORMAL HIGH (ref 180–914)

## 2023-06-22 MED ORDER — METHYLPREDNISOLONE SODIUM SUCC 125 MG IJ SOLR
125.0000 mg | Freq: Every day | INTRAMUSCULAR | Status: DC
  Administered 2023-06-22 – 2023-06-23 (×2): 125 mg via INTRAVENOUS
  Filled 2023-06-22 (×2): qty 2

## 2023-06-22 MED ORDER — DIPHENHYDRAMINE HCL 25 MG PO CAPS
25.0000 mg | ORAL_CAPSULE | Freq: Every evening | ORAL | Status: DC | PRN
  Administered 2023-06-22 – 2023-06-25 (×4): 25 mg via ORAL
  Filled 2023-06-22 (×4): qty 1

## 2023-06-22 NOTE — Progress Notes (Signed)
 RT note. Patient found on CPAP with [place] in nose. Patient took CPAP off when doing breathing treatment, patient left on 2 L [place] sat 96%, RT will continue to monitor.    06/22/23 1436  Aerosol Therapy Tx  $ Hand Held Nebulizer  1  Medications Duoneb  Delivery Source Air  Delivery Device HHN  Mid-Treatment Pulse 69  Post-Treatment Pulse 69  Post-Treatment Respirations 25  Treatment Tolerance Tolerated well  Treatment Given 1  RT Breath Sounds  Bilateral Breath Sounds Diminished  Oxygen  Therapy/Pulse Ox  O2 Device (S)  Nasal Cannula  O2 Therapy Oxygen  humidified  O2 Flow Rate (L/min) 2 L/min  SpO2 96 %

## 2023-06-22 NOTE — Progress Notes (Signed)
 Patient now on 7 L HFNC and satting in the mid 90's.

## 2023-06-22 NOTE — Progress Notes (Addendum)
 PROGRESS NOTE    Kirk [name]  ZOX:[id number] DOB: 1960/11/05 DOA: 06/19/2023 PCP: Estil Heman, NP   Brief Narrative:   [name] is a 63 y.o. male with medical history significant for hfref, copd, esrd on mwf dialysis, htn, osa on cpap, a fib not anticoagulated, who presents with the above.   Reports feeling ill for several days, feeling short of breath, at rest but worse with ambulation. Some cough as well, no fevers. No vomiting or diarrhea but has had a week of black stool. Is on asa, denies nsaids.  Patient presents from HD center during dialysis session due to dyspnea, increased work of breathing, requiring BiPAP on presentation, his workup significant for volume overload, COPD/probation and anemia .  Assessment & Plan:   Principal Problem:   Respiratory distress Active Problems:   OSA on CPAP   Essential hypertension   Alports syndrome   Obesity   Type 2 diabetes mellitus with diabetic nephropathy, without long-term current use of insulin  (HCC)   SCC (squamous cell carcinoma), arm   Narcotic drug use   ESRD (end stage renal disease) (HCC)   Chronic systolic heart failure (HCC)   Stage IV carcinoma of breast (HCC)   COPD (chronic obstructive pulmonary disease) (HCC)   PAF (paroxysmal atrial fibrillation) (HCC)   Anemia due to chronic blood loss   Melena  Acute hypoxic respiratory failure Acute COPD exacerbation Symptomatic anemia Acute on chronic systolic CHF Rhinovirus infection with asthma exacerbation -Patient presents with acute respiratory failure with hypoxia, initially requiring BiPAP, currently on oxygen , this morning on 3 L oxygen , but he is maintaining saturation in the mid 90s on room air once off oxygen  - Encouraged again to use incentive spirometry and flutter valve . - Remains with significant wheezing, likely in setting of his rhinovirus causing asthma exacerbation, I will increase his IV Solu-Medrol  to 125 mg IV daily, continue with PPI for  GI prophylaxis meanwhile, continue with nebs as needed  - Volume management with hemodialysis .  Volume status much improved after receiving dialysis 2 days back-to-back. - Please see discussion below under anemia . - He was on CPAP overnight, significant episodes of desaturation once off CPAP and not on oxygen .   Symptomatic anemia  Macrocytic anemia GI bleed  Anemia of chronic kidney disease  -He reports melena, hemoglobin 6.8 on presentation, responded well to transfusion, hemoglobin remained stable - GI input greatly appreciated, continue with PPI twice daily, for endoscopy 5/30, with no concerning findings for GI bleed . - Anemia most likely in setting of chronic kidney disease, as B12, folic acid  and ferritin not low   ESRD on HD:  -Dialysis per nephrology.  Dialyzed initially back-to-back for volume overload  Acute on chronic systolic CHF  - EF 35% on recent echo.  Chest x-ray with vascular congestion.  Volume management per dialysis.  Hypokalemia:  -Corrected with dialysis.  Essential hypertension:  - Blood pressure controlled, continue home medications which includes amlodipine , Lopressor  and Imdur .  Type 2 diabetes mellitus: Diet controlled.  Continue SSI.  History of OSA: CPAP at night.  History of A-fib: Not anticoagulated per cardiology.  He will be high risk in the setting of thrombocytopenia, and possible GI bleed currently normal sinus rhythm  History of pancytopenia:  Thrombocytopenia - This is at baseline  DVT prophylaxis: SCDs Start: 06/19/23 1750   Code Status: Full Code  Family Communication:  None present at bedside.  Plan of care discussed with patient in length and  he/she verbalized understanding and agreed with it.  Status is: Inpatient Remains inpatient appropriate because: Still hypoxic, requires further workup and evaluation by GI.   Estimated body mass index is 28.19 kg/m as calculated from the following:   Height as of this encounter: 5\' 7"   (1.702 m).   Weight as of this encounter: 81.6 kg.    Nutritional Assessment: Body mass index is 28.19 kg/m.Aaron Aas Seen by dietician.  I agree with the assessment and plan as outlined below: Nutrition Status:          Consultants:  GI nephrology Renal Procedures:  As above  Antimicrobials:  Anti-infectives (From admission, onward)    None         Subjective:  Reports he is feeling better today, but still dyspneic with activity, went for endoscopy yesterday. .  Objective: Vitals:   06/22/23 1610 06/22/23 0611 06/22/23 0716 06/22/23 0832  BP:    133/62  Pulse: 66 60  80  Resp: (!) 21 18  20   Temp:    (!) 97.5 F (36.4 C)  TempSrc:    Temporal  SpO2: 100% 100% 100%   Weight:      Height:        Intake/Output Summary (Last 24 hours) at 06/22/2023 1041 Last data filed at 06/21/2023 2100 Gross per 24 hour  Intake 350 ml  Output 3000 ml  Net -2650 ml   Filed Weights   06/21/23 0819 06/21/23 1218 06/21/23 1453  Weight: 84.5 kg 82.5 kg 81.6 kg    Examination:  Awake Alert, Oriented X 3, frail Symmetrical Chest wall movement, decreased air entry bilaterally with wheezing RRR,No Gallops,Rubs or new Murmurs, No Parasternal Heave +ve B.Sounds, Abd Soft, No tenderness, No rebound - guarding or rigidity. No Cyanosis, Clubbing or edema, No new Rash or bruise      Data Reviewed: I have personally reviewed following labs and imaging studies  CBC: Recent Labs  Lab 06/19/23 1606 06/20/23 0426 06/21/23 0833 06/22/23 0208  WBC 2.3* 2.1* 3.3* 5.2  NEUTROABS 1.4*  --   --   --   HGB 6.8* 8.5* 8.2* 9.7*  HCT 21.4* 26.5* 25.1* 30.0*  MCV 100.5* 98.9 99.2 99.3  PLT 74* 78* 73* 115*   Basic Metabolic Panel: Recent Labs  Lab 06/19/23 1606 06/20/23 0426 06/20/23 1742 06/21/23 0833 06/22/23 0208  NA 136 136  --  133* 134*  K 2.8* 4.0  --  4.3 4.2  CL 94* 95*  --  91* 91*  CO2 31 26  --  25 27  GLUCOSE 110* 148*  --  154* 148*  BUN 10 19  --  50* 41*   CREATININE 4.05* 5.53*  --  8.78* 7.11*  CALCIUM  8.1* 8.7*  --  9.0 9.6  PHOS  --   --  5.0* 5.6*  --    GFR: Estimated Creatinine Clearance: 11.2 mL/min (A) (by C-G formula based on SCr of 7.11 mg/dL (H)). Liver Function Tests: Recent Labs  Lab 06/20/23 1742 06/21/23 0833  ALBUMIN  3.5 3.5   No results for input(s): "LIPASE", "AMYLASE" in the last 168 hours. No results for input(s): "AMMONIA" in the last 168 hours. Coagulation Profile: No results for input(s): "INR", "PROTIME" in the last 168 hours. Cardiac Enzymes: No results for input(s): "CKTOTAL", "CKMB", "CKMBINDEX", "TROPONINI" in the last 168 hours. BNP (last 3 results) No results for input(s): "PROBNP" in the last 8760 hours. HbA1C: No results for input(s): "HGBA1C" in the last 72 hours. CBG: Recent  Labs  Lab 06/20/23 0823 06/21/23 0356  GLUCAP 150* 152*   Lipid Profile: No results for input(s): "CHOL", "HDL", "LDLCALC", "TRIG", "CHOLHDL", "LDLDIRECT" in the last 72 hours. Thyroid  Function Tests: No results for input(s): "TSH", "T4TOTAL", "FREET4", "T3FREE", "THYROIDAB" in the last 72 hours. Anemia Panel: Recent Labs    06/22/23 0208  VITAMINB12 >7,500*  FOLATE 10.8  FERRITIN 1,481*  TIBC 265  IRON 102  RETICCTPCT 1.8   Sepsis Labs: No results for input(s): "PROCALCITON", "LATICACIDVEN" in the last 168 hours.  Recent Results (from the past 240 hours)  Resp panel by RT-PCR (RSV, Flu A&B, Covid) Anterior Nasal Swab     Status: None   Collection Time: 06/19/23  4:06 PM   Specimen: Anterior Nasal Swab  Result Value Ref Range Status   SARS Coronavirus 2 by RT PCR NEGATIVE NEGATIVE Final   Influenza A by PCR NEGATIVE NEGATIVE Final   Influenza B by PCR NEGATIVE NEGATIVE Final    Comment: (NOTE) The Xpert Xpress SARS-CoV-2/FLU/RSV plus assay is intended as an aid in the diagnosis of influenza from Nasopharyngeal swab specimens and should not be used as a sole basis for treatment. Nasal washings  and aspirates are unacceptable for Xpert Xpress SARS-CoV-2/FLU/RSV testing.  Fact Sheet for Patients: BloggerCourse.com  Fact Sheet for Healthcare Providers: SeriousBroker.it  This test is not yet approved or cleared by the [place]  FDA and has been authorized for detection and/or diagnosis of SARS-CoV-2 by FDA under an Emergency Use Authorization (EUA). This EUA will remain in effect (meaning this test can be used) for the duration of the COVID-19 declaration under Section 564(b)(1) of the Act, 21 U.S.C. section 360bbb-3(b)(1), unless the authorization is terminated or revoked.     Resp Syncytial Virus by PCR NEGATIVE NEGATIVE Final    Comment: (NOTE) Fact Sheet for Patients: BloggerCourse.com  Fact Sheet for Healthcare Providers: SeriousBroker.it  This test is not yet approved or cleared by the [place]  FDA and has been authorized for detection and/or diagnosis of SARS-CoV-2 by FDA under an Emergency Use Authorization (EUA). This EUA will remain in effect (meaning this test can be used) for the duration of the COVID-19 declaration under Section 564(b)(1) of the Act, 21 U.S.C. section 360bbb-3(b)(1), unless the authorization is terminated or revoked.  Performed at [facility], [address], [place], [place] [address]   Respiratory (~20 pathogens) panel by PCR     Status: Abnormal   Collection Time: 06/19/23  4:06 PM   Specimen: Nasopharyngeal Swab; Respiratory  Result Value Ref Range Status   Adenovirus NOT DETECTED NOT DETECTED Final   Coronavirus 229E NOT DETECTED NOT DETECTED Final    Comment: (NOTE) The Coronavirus on the Respiratory Panel, DOES NOT test for the novel  Coronavirus (2019 nCoV)    Coronavirus HKU1 NOT DETECTED NOT DETECTED Final   Coronavirus NL63 NOT DETECTED NOT DETECTED Final   Coronavirus OC43 NOT DETECTED NOT DETECTED Final    Metapneumovirus NOT DETECTED NOT DETECTED Final   Rhinovirus / Enterovirus DETECTED (A) NOT DETECTED Final   Influenza A NOT DETECTED NOT DETECTED Final   Influenza B NOT DETECTED NOT DETECTED Final   Parainfluenza Virus 1 NOT DETECTED NOT DETECTED Final   Parainfluenza Virus 2 NOT DETECTED NOT DETECTED Final   Parainfluenza Virus 3 NOT DETECTED NOT DETECTED Final   Parainfluenza Virus 4 NOT DETECTED NOT DETECTED Final   Respiratory Syncytial Virus NOT DETECTED NOT DETECTED Final   Bordetella pertussis NOT DETECTED NOT DETECTED Final  Bordetella Parapertussis NOT DETECTED NOT DETECTED Final   Chlamydophila pneumoniae NOT DETECTED NOT DETECTED Final   Mycoplasma pneumoniae NOT DETECTED NOT DETECTED Final    Comment: Performed at [facility], [address], [place], [place] [address]     Radiology Studies: No results found.   Scheduled Meds:  amLODipine   10 mg Oral Daily   Chlorhexidine  Gluconate Cloth  6 each Topical Q0600   doxazosin   4 mg Oral QHS   DULoxetine   60 mg Oral q AM   ipratropium-albuterol   3 mL Nebulization Q6H   isosorbide  mononitrate  60 mg Oral q AM   methylPREDNISolone  (SOLU-MEDROL ) injection  125 mg Intravenous Daily   metoprolol  tartrate  50 mg Oral BID   pantoprazole  (PROTONIX ) IV  40 mg Intravenous Q12H   sevelamer  carbonate  4.8 g Oral TID WC   sodium chloride  flush  3 mL Intravenous Q12H   Continuous Infusions:     LOS: 3 days   Seena Dadds, MD [facility]  06/22/2023, 10:41 AM   *Please note that this is a verbal dictation therefore any spelling or grammatical errors are due to the "Dragon Medical One" system interpretation.  Please page via Amion and do not message via secure chat for urgent patient care matters. Secure chat can be used for non urgent patient care matters.  How to contact the [facility] Attending or Consulting provider 7A - 7P or covering provider during after hours 7P -7A, for this patient?  Check the care team  in [facility] and look for a) attending/consulting [facility] provider listed and b) the [facility] team listed. Page or secure chat 7A-7P. Log into [email or web address] and use [place]'s universal password to access. If you do not have the password, please contact the hospital operator. Locate the [facility] provider you are looking for under [facility] and page to a number that you can be directly reached. If you still have difficulty reaching the provider, please page the [facility] (Director on Call) for the Hospitalists listed on amion for assistance.

## 2023-06-22 NOTE — Progress Notes (Signed)
 Patient desating to 87% on 4L O2 [place]. R.N. increased O2 to 7L. Patient sats only improved to 88%. R.N. waiting for HF [place] tubing to be sent to unit. Patient given scheduled 0200 nebulizer treatment an hour early by R.N. RT aware. Sats now at 98% on 7L [place] with nebulizer treatment running.

## 2023-06-22 NOTE — Progress Notes (Addendum)
 [facility] Progress Note  Subjective:  Seen in room Went for EGD yest UF 3 L yest w/ HD, no BP's drops  Vitals:   06/22/23 0500 06/22/23 0514 06/22/23 0611 06/22/23 0832  BP:    133/62  Pulse: 76 66 60 80  Resp: (!) 29 (!) 21 18 20   Temp:    (!) 97.5 F (36.4 C)  TempSrc:    Temporal  SpO2: 100% 100% 100%   Weight:      Height:        Exam: Gen alert, no distress, HFNC 2-7 No jvd or bruits Chest diffuse mild rhonchi and mild wheezing RRR no MRG Abd soft ntnd no mass or ascites +bs Ext no sig LE or UE edema, no other edema Neuro is alert, Ox 3 , nf    LUA AVF+bruit      Renal-related home meds: Renvela  6 ac tid Losartan  25 every day Norvasc  10 every day Doxazosin  4 mg hs Hydralazine  50 tid Lopressor  50 bid Renavite every day Velphoro  3 ac tid       OP HD: MWF G-O 4h   B400   82.7kg  L AVF   Heparin  2500 Last OP HD 5/28, post wt 84.3kg Usually gets to dry wt, but wt gains are typically 3-4 kg   CXR 5/28 - vasc congestion and mild IS edema      Assessment/ Plan: Acute hypoxic resp failure: +vasc congestion + IS edema on initial CXR.  Suspected volume overload + others (asthma/ rhinovirus). Has had HD here x 2 w/ 5.7 L off total and is at dry wt as of yesterday. Next HD Monday.  Acute rhinovirus infection: per pmd Asthma: nebs, IV steroids, etc.. per pmd  Acute on chronic anemia: w/ dark stools x 1 week. Hb improved sp prbc's up to 8-10 range now.  ESRD: HD MWF. Next HD Monday.  HTN: bp's wnl, on 3 bp medications. Follow.  Secondary hyperparathyroidism: Ca in range, cont binders   Kirk Poag MD  [facility] 06/22/2023, 8:46 AM  Recent Labs  Lab 06/20/23 1742 06/21/23 0833 06/22/23 0208  HGB  --  8.2* 9.7*  ALBUMIN  3.5 3.5  --   CALCIUM   --  9.0 9.6  PHOS 5.0* 5.6*  --   CREATININE  --  8.78* 7.11*  K  --  4.3 4.2   Recent Labs  Lab 06/22/23 0208  IRON 102  TIBC 265  FERRITIN 1,481*   Inpatient medications:  amLODipine   10 mg  Oral Daily   Chlorhexidine  Gluconate Cloth  6 each Topical Q0600   doxazosin   4 mg Oral QHS   DULoxetine   60 mg Oral q AM   ipratropium-albuterol   3 mL Nebulization Q6H   isosorbide  mononitrate  60 mg Oral q AM   methylPREDNISolone  (SOLU-MEDROL ) injection  40 mg Intravenous Q12H   metoprolol  tartrate  50 mg Oral BID   pantoprazole  (PROTONIX ) IV  40 mg Intravenous Q12H   sevelamer  carbonate  4.8 g Oral TID WC   sodium chloride  flush  3 mL Intravenous Q12H     albuterol , diphenhydrAMINE , guaiFENesin , sodium chloride  flush

## 2023-06-22 NOTE — Evaluation (Signed)
 Physical Therapy Evaluation Patient Details Name: Kirk [name] MRN: [id number] DOB: October 30, 1960 Today's Date: 06/22/2023  History of Present Illness  63 y.o. male who presents 5/28 with shortness of breath. Admitted with Acute hypoxic respiratory failure,  Acute COPD exacerbation,  Symptomatic anemia,  Acute on chronic systolic CHF,  Rhinovirus infection with asthma exacerbation. PMH: hfref, copd, esrd on [facility]wf dialysis, htn, osa on cpap, a fib not anticoagulated.   Clinical Impression  Pt admitted with above diagnosis. Independent PTA, looks after his disabled wife, mainly performing IADLs for her. Patient demonstrated spontaneous buckling of LEs while ambulating with RW, requiring physical intervention several times to prevent falling. Reduced insight into deficits and safety. Educated on findings, recommendations. Hopeful that his function will improve rapidly as medical issues resolve. Will need to be at a Mod I level to return home safely as he has limited support and a flight of stairs to climb to enter his home. If pt does not demonstrate significant functional improvement in a few days, would consider post acute rehab placement. Pt currently with functional limitations due to the deficits listed below (see PT Problem List). Pt will benefit from acute skilled PT to increase their independence and safety with mobility to allow discharge.           If plan is discharge home, recommend the following: A little help with walking and/or transfers;A little help with bathing/dressing/bathroom;Assistance with cooking/housework;Assist for transportation;Help with stairs or ramp for entrance;Supervision due to cognitive status   Can travel by private vehicle        Equipment Recommendations Rolling walker (2 wheels)  Recommendations for Other Services  OT consult    Functional Status Assessment Patient has had a recent decline in their functional status and demonstrates the ability to make  significant improvements in function in a reasonable and predictable amount of time.     Precautions / Restrictions Precautions Precautions: Fall Recall of Precautions/Restrictions: Intact Precaution/Restrictions Comments: monitor O2 Restrictions Weight Bearing Restrictions Per Provider Order: No      Mobility  Bed Mobility               General bed mobility comments: in recliner    Transfers Overall transfer level: Needs assistance Equipment used: None, Rolling walker (2 wheels) Transfers: Sit to/from Stand, Bed to chair/wheelchair/BSC Sit to Stand: Contact guard assist   Step pivot transfers: Contact guard assist       General transfer comment: A little impulsive to rise before RW in place required CGA with increased sway. With RW pt shows improved support. Cues for technique to steady himself. CGA for step pivot transfer to recliner. holding arm rests of seat.    Ambulation/Gait Ambulation/Gait assistance: Min assist Gait Distance (Feet): 35 Feet Assistive device: Rolling walker (2 wheels) Gait Pattern/deviations: Step-through pattern, Decreased stride length, Knees buckling, Leaning posteriorly, Staggering left, Staggering right, Trunk flexed, Wide base of support Gait velocity: decr Gait velocity interpretation: <1.31 ft/sec, indicative of household ambulator   General Gait Details: Educated on RW for support, pt tolerated initial distance well but rapidly became fatigued with LEs spontaneously buckling requiring min assist to stabilize in RW and chair expodited to sit. SpO2 91% on 2L, 87% on RA.  Stairs            Wheelchair Mobility     Tilt Bed    Modified Rankin (Stroke Patients Only)       Balance Overall balance assessment: Needs assistance Sitting-balance support: No upper extremity supported, Feet  supported Sitting balance-Leahy Scale: Good     Standing balance support: During functional activity, Bilateral upper extremity  supported Standing balance-Leahy Scale: Fair Standing balance comment: Fair initially but fatigues rapidly and needs BIL UE support.                             Pertinent Vitals/Pain Pain Assessment Pain Assessment: No/denies pain    Home Living Family/patient expects to be discharged to:: Private residence Living Arrangements: Spouse/significant other;Children Available Help at Discharge: Family;Available 24 hours/day (Pt cares for his wife (does chores but she can physically take care of herself.)) Type of Home: House Home Access: Stairs to enter Entrance Stairs-Rails: Left Entrance Stairs-Number of Steps: 9-12   Home Layout: One level Home Equipment: BSC/3in1;Shower seat;Grab bars - toilet      Prior Function Prior Level of Function : Independent/Modified Independent             Mobility Comments: No AD ADLs Comments: Does the chores at home b/c wife is disabled. Able to care for himself.     Extremity/Trunk Assessment   Upper Extremity Assessment Upper Extremity Assessment: Defer to OT evaluation    Lower Extremity Assessment Lower Extremity Assessment: Generalized weakness       Communication   Communication Communication: Impaired Factors Affecting Communication: Hearing impaired    Cognition Arousal: Alert Behavior During Therapy: Impulsive   PT - Cognitive impairments: No family/caregiver present to determine baseline, Memory, Awareness, Safety/Judgement, Problem solving, Sequencing, Attention                       PT - Cognition Comments: Poor awareness of deficits Following commands: Intact       Cueing Cueing Techniques: Verbal cues     General Comments General comments (skin integrity, edema, etc.): SpO2 93% at rest on 2L HFNC, 89-91% on RA at rest. While ambulating SpO2 87% on RA, and 91% on 2L. Moderate dyspnea.    Exercises     Assessment/Plan    PT Assessment Patient needs continued PT services  PT Problem  List Decreased strength;Decreased activity tolerance;Decreased balance;Decreased mobility;Decreased cognition;Decreased coordination;Decreased knowledge of use of DME;Decreased safety awareness;Decreased knowledge of precautions;Cardiopulmonary status limiting activity;Obesity       PT Treatment Interventions DME instruction;Gait training;Stair training;Functional mobility training;Therapeutic activities;Therapeutic exercise;Balance training;Neuromuscular re-education;Patient/family education;Cognitive remediation    PT Goals (Current goals can be found in the Care Plan section)  Acute Rehab PT Goals Patient Stated Goal: Go home PT Goal Formulation: With patient Time For Goal Achievement: 07/05/23 Potential to Achieve Goals: Good    Frequency Min 2X/week     Co-evaluation               AM-PAC PT "6 Clicks" Mobility  Outcome Measure Help needed turning from your back to your side while in a flat bed without using bedrails?: A Little Help needed moving from lying on your back to sitting on the side of a flat bed without using bedrails?: A Little Help needed moving to and from a bed to a chair (including a wheelchair)?: A Little Help needed standing up from a chair using your arms (e.g., wheelchair or bedside chair)?: A Little Help needed to walk in hospital room?: A Little Help needed climbing 3-5 steps with a railing? : A Lot 6 Click Score: 17    End of Session Equipment Utilized During Treatment: Gait belt;Oxygen  Activity Tolerance: Patient limited by fatigue Patient left:  in chair;with call bell/phone within reach;with chair alarm set Nurse Communication: Mobility status;Other (comment) (Knees buckling, high fall risk. Sats) PT Visit Diagnosis: Unsteadiness on feet (R26.81);Other abnormalities of gait and mobility (R26.89);Muscle weakness (generalized) (M62.81);Difficulty in walking, not elsewhere classified (R26.2)    Time: 1443-1540 PT Time Calculation (min) (ACUTE  ONLY): 26 min   Charges:   PT Evaluation $PT Eval Low Complexity: 1 Low PT Treatments $Gait Training: 8-22 mins PT General Charges $$ ACUTE PT VISIT: 1 Visit         Jory Ng, PT, DPT [facility]  Rehabilitation Services Physical Therapist Office: [phone] Website: [email or web address]   Alinda Irani 06/22/2023, 10:59 AM

## 2023-06-22 NOTE — Plan of Care (Signed)

## 2023-06-23 DIAGNOSIS — N186 End stage renal disease: Secondary | ICD-10-CM | POA: Diagnosis not present

## 2023-06-23 DIAGNOSIS — T8612 Kidney transplant failure: Secondary | ICD-10-CM | POA: Diagnosis not present

## 2023-06-23 DIAGNOSIS — R0603 Acute respiratory distress: Secondary | ICD-10-CM | POA: Diagnosis not present

## 2023-06-23 DIAGNOSIS — Z992 Dependence on renal dialysis: Secondary | ICD-10-CM | POA: Diagnosis not present

## 2023-06-23 MED ORDER — CHLORHEXIDINE GLUCONATE CLOTH 2 % EX PADS
6.0000 | MEDICATED_PAD | Freq: Every day | CUTANEOUS | Status: DC
  Administered 2023-06-24 – 2023-06-25 (×2): 6 via TOPICAL

## 2023-06-23 MED ORDER — METHYLPREDNISOLONE SODIUM SUCC 125 MG IJ SOLR
80.0000 mg | Freq: Every day | INTRAMUSCULAR | Status: DC
  Administered 2023-06-24 – 2023-06-25 (×2): 80 mg via INTRAVENOUS
  Filled 2023-06-23 (×2): qty 2

## 2023-06-23 NOTE — Evaluation (Signed)
 Occupational Therapy Evaluation Patient Details Name: Kirk [name] MRN: [id number] DOB: 10/20/1960 Today's Date: 06/23/2023   History of Present Illness   63 y.o. male who presents 5/28 with shortness of breath. Admitted with Acute hypoxic respiratory failure,  Acute COPD exacerbation,  Symptomatic anemia,  Acute on chronic systolic CHF,  Rhinovirus infection with asthma exacerbation. PMH: hfref, copd, esrd on mwf dialysis, htn, osa on cpap, a fib not anticoagulated.     Clinical Impressions Pt admitted based on above, and was seen based on problem list below. PTA pt was living with his wife for who he is the primary caregiver for. He was independent with ADLs and IADLs. Today pt is requiring set up  to min  assist for  ADLs. Functional transfers are  min assist with use of RW. Upon entry pt on 3L HF O2 reporting SOB, however maintaining O2 levels btw 92-94%. During <20 ft ambulation with RW x2 LOB d/t fatigue able to self correct with therapist assist. Pt reporting short ambulation feeling like "running a marathon". Pt unable to complete ADLs without frequent rest breaks d/t fatigue. Pt would greatly benefit from  <3 hours of skilled rehab daily prior to d/c home to improve activity tolerance. OT will continue to follow acutely to maximize functional independence.        If plan is discharge home, recommend the following:   A lot of help with walking and/or transfers;A lot of help with bathing/dressing/bathroom     Functional Status Assessment   Patient has had a recent decline in their functional status and demonstrates the ability to make significant improvements in function in a reasonable and predictable amount of time.     Equipment Recommendations   Other (comment) (Defer to next venue)     Recommendations for Other Services         Precautions/Restrictions   Precautions Precautions: Fall Recall of Precautions/Restrictions: Intact Precaution/Restrictions  Comments: monitor O2 Restrictions Weight Bearing Restrictions Per Provider Order: No     Mobility Bed Mobility Overal bed mobility: Needs Assistance             General bed mobility comments: Received in recliner    Transfers Overall transfer level: Needs assistance Equipment used: None, Rolling walker (2 wheels) Transfers: Sit to/from Stand, Bed to chair/wheelchair/BSC Sit to Stand: Contact guard assist     Step pivot transfers: Min assist     General transfer comment: Min assist to steady without RW, benefits from use of RW, especially for rest breaks      Balance Overall balance assessment: Needs assistance Sitting-balance support: No upper extremity supported, Feet supported Sitting balance-Leahy Scale: Fair     Standing balance support: During functional activity, Bilateral upper extremity supported Standing balance-Leahy Scale: Poor Standing balance comment: Fatigues fast 2x LOB able to self-correct with min assist         ADL either performed or assessed with clinical judgement   ADL Overall ADL's : Needs assistance/impaired Eating/Feeding: Set up;Sitting   Grooming: Set up;Sitting   Upper Body Bathing: Set up;Sitting   Lower Body Bathing: Minimal assistance;Sit to/from stand Lower Body Bathing Details (indicate cue type and reason): Min assist to steady Upper Body Dressing : Set up;Sitting   Lower Body Dressing: Minimal assistance;Sit to/from stand Lower Body Dressing Details (indicate cue type and reason): Min assist to steady in standing, able to figure 4 Toilet Transfer: Minimal assistance;Ambulation;Rolling walker (2 wheels) Toilet Transfer Details (indicate cue type and reason): Trialed with/without RW support,  benefits from use of RW Toileting- Architect and Hygiene: Minimal assistance;Sit to/from stand Toileting - Clothing Manipulation Details (indicate cue type and reason): Min assist to steady     Functional mobility during  ADLs: Minimal assistance;Rolling walker (2 wheels) General ADL Comments: Signficantly decreased activity tolerance requiring multiple short breaks to ambulate <20 ft.     Vision Baseline Vision/History: 1 Wears glasses Patient Visual Report: No change from baseline Vision Assessment?: No apparent visual deficits            Pertinent Vitals/Pain Pain Assessment Pain Assessment: No/denies pain     Extremity/Trunk Assessment Upper Extremity Assessment Upper Extremity Assessment: Generalized weakness   Lower Extremity Assessment Lower Extremity Assessment: Defer to PT evaluation   Cervical / Trunk Assessment Cervical / Trunk Assessment: Normal   Communication Communication Communication: Impaired Factors Affecting Communication: Hearing impaired   Cognition Arousal: Alert Behavior During Therapy: WFL for tasks assessed/performed Cognition: No apparent impairments     Following commands: Intact       Cueing  General Comments   Cueing Techniques: Verbal cues  SpO2 during session on 3L HFNC maintaining within 92-94% throughout session however pt reporting extreme SOB and fatigue           Home Living Family/patient expects to be discharged to:: Private residence Living Arrangements: Spouse/significant other;Children Available Help at Discharge: Family;Available 24 hours/day (Pt reports providing min to mod ADL assist to wife, especially with transfers) Type of Home: House Home Access: Stairs to enter [employer] of Steps: 9-12 Entrance Stairs-Rails: Left Home Layout: One level     Bathroom Shower/Tub: Producer, television/film/video: Standard Bathroom Accessibility: Yes How Accessible: Accessible via walker Home Equipment: BSC/3in1;Shower seat;Grab bars - toilet   Additional Comments: Pt reports daughter is currently assisting wife during current admission      Prior Functioning/Environment Prior Level of Function : Independent/Modified  Independent             Mobility Comments: No AD ADLs Comments: Pt is his wife's primary caregiver    OT Problem List: Decreased strength;Decreased range of motion;Decreased activity tolerance;Impaired balance (sitting and/or standing);Decreased safety awareness;Decreased knowledge of use of DME or AE;Cardiopulmonary status limiting activity   OT Treatment/Interventions: Self-care/ADL training;Therapeutic exercise;Energy conservation;DME and/or AE instruction;Therapeutic activities;Patient/family education;Balance training      OT Goals(Current goals can be found in the care plan section)   Acute Rehab OT Goals Patient Stated Goal: To get stronger OT Goal Formulation: With patient Time For Goal Achievement: 07/07/23 Potential to Achieve Goals: Good   OT Frequency:  Min 2X/week       AM-PAC OT "6 Clicks" Daily Activity     Outcome Measure Help from another person eating meals?: None Help from another person taking care of personal grooming?: A Little Help from another person toileting, which includes using toliet, bedpan, or urinal?: A Lot Help from another person bathing (including washing, rinsing, drying)?: A Lot Help from another person to put on and taking off regular upper body clothing?: A Little Help from another person to put on and taking off regular lower body clothing?: A Lot 6 Click Score: 16   End of Session Equipment Utilized During Treatment: Gait belt;Rolling walker (2 wheels) Nurse Communication: Mobility status  Activity Tolerance: Patient limited by fatigue Patient left: in chair;with call bell/phone within reach  OT Visit Diagnosis: Other abnormalities of gait and mobility (R26.89);Unsteadiness on feet (R26.81);Muscle weakness (generalized) (M62.81)  Time: 1914-7829 OT Time Calculation (min): 24 min Charges:  OT General Charges $OT Visit: 1 Visit OT Evaluation $OT Eval Moderate Complexity: 1 Mod  Delmer Ferraris, OT  Acute  Rehabilitation Services Office [phone] Secure chat preferred   Mickael Alamo 06/23/2023, 4:56 PM

## 2023-06-23 NOTE — Progress Notes (Signed)
 Kirk [name] Progress Note  Subjective:  Seen in room No c/o's today  Vitals:   06/23/23 0600 06/23/23 0834 06/23/23 1200 06/23/23 1616  BP: (!) 151/75  129/62   Pulse: 73  67   Resp: (!) 23  18   Temp:   98 F (36.7 C) (!) 97.4 F (36.3 C)  TempSrc:   Oral Oral  SpO2: 94% 94% 94%   Weight:      Height:        Exam: Gen alert, no distress, [place] 3 L O2 No jvd or bruits Chest diffuse mild rhonchi and mild wheezing RRR no MRG Abd soft ntnd no mass or ascites +bs Ext no sig LE or UE edema, no other edema Neuro is alert, Ox 3 , nf    LUA AVF+bruit      Renal-related home meds: Renvela  6 ac tid Losartan  25 every day Norvasc  10 every day Doxazosin  4 mg hs Hydralazine  50 tid Lopressor  50 bid Renavite every day Velphoro  3 ac tid       OP HD: MWF G-O 4h   B400   82.7kg  L AVF   Heparin  2500 Last OP HD 5/28, post wt 84.3kg Usually gets to dry wt, but wt gains are typically 3-4 kg   CXR 5/28 - vasc congestion and mild IS edema      Assessment/ Plan: Acute hypoxic resp failure: +vasc congestion + IS edema on initial CXR.  Suspected volume overload + asthma + rhinovirus. Has had HD here x 2 w/ 5.7 L off total and was at dry wt as of 5/30. May be able to lower vol a bit lower w/ HD.  Acute rhinovirus infection: per pmd Asthma: nebs, IV steroids per pmd  ESRD: HD MWF. Next HD Monday. Acute on chronic anemia: w/ dark stools x 1 week. Hb improved sp prbc's up to 8-10 range now.   HTN: bp's wnl, on 3 bp medications. Follow.  Secondary hyperparathyroidism: Ca in range, cont binders   Larry Poag MD  [facility] 06/23/2023, 6:38 PM  Recent Labs  Lab 06/20/23 1742 06/21/23 0833 06/22/23 0208  HGB  --  8.2* 9.7*  ALBUMIN  3.5 3.5  --   CALCIUM   --  9.0 9.6  PHOS 5.0* 5.6*  --   CREATININE  --  8.78* 7.11*  K  --  4.3 4.2   Recent Labs  Lab 06/22/23 0208  IRON 102  TIBC 265  FERRITIN 1,481*   Inpatient medications:  amLODipine   10 mg Oral Daily    Chlorhexidine  Gluconate Cloth  6 each Topical Q0600   doxazosin   4 mg Oral QHS   DULoxetine   60 mg Oral q AM   ipratropium-albuterol   3 mL Nebulization Q6H   isosorbide  mononitrate  60 mg Oral q AM   [START ON 06/24/2023] methylPREDNISolone  (SOLU-MEDROL ) injection  80 mg Intravenous Daily   metoprolol  tartrate  50 mg Oral BID   pantoprazole  (PROTONIX ) IV  40 mg Intravenous Q12H   sevelamer  carbonate  4.8 g Oral TID WC   sodium chloride  flush  3 mL Intravenous Q12H     albuterol , diphenhydrAMINE , guaiFENesin , sodium chloride  flush

## 2023-06-23 NOTE — Plan of Care (Signed)

## 2023-06-23 NOTE — Progress Notes (Signed)
 Placed patient on CPAP for the night via auto-mode with oxygen set at 2lpm

## 2023-06-23 NOTE — Progress Notes (Signed)
 PROGRESS NOTE    Kirk [name]  KGM:[id number] DOB: 09/27/1960 DOA: 06/19/2023 PCP: Estil Heman, NP   Brief Narrative:   FIELDS OROS is a 63 y.o. male with medical history significant for hfref, copd, esrd on mwf dialysis, htn, osa on cpap, a fib not anticoagulated, who presents with the above.   Reports feeling ill for several days, feeling short of breath, at rest but worse with ambulation. Some cough as well, no fevers. No vomiting or diarrhea but has had a week of black stool. Is on asa, denies nsaids.  Patient presents from HD center during dialysis session due to dyspnea, increased work of breathing, requiring BiPAP on presentation, his workup significant for volume overload, COPD/probation and anemia .  Assessment & Plan:   Principal Problem:   Respiratory distress Active Problems:   OSA on CPAP   Essential hypertension   Alports syndrome   Obesity   Type 2 diabetes mellitus with diabetic nephropathy, without long-term current use of insulin  (HCC)   SCC (squamous cell carcinoma), arm   Narcotic drug use   ESRD (end stage renal disease) (HCC)   Chronic systolic heart failure (HCC)   Stage IV carcinoma of breast (HCC)   COPD (chronic obstructive pulmonary disease) (HCC)   PAF (paroxysmal atrial fibrillation) (HCC)   Anemia due to chronic blood loss   Melena  Acute hypoxic respiratory failure Acute COPD exacerbation Symptomatic anemia Acute on chronic systolic CHF Rhinovirus infection with asthma exacerbation -Patient presents with acute respiratory failure with hypoxia, initially requiring BiPAP, currently on oxygen , this morning on 3 L oxygen , but he is maintaining saturation in the mid 90s on room air once off oxygen , 5/30, he was significantly dyspneic, creased work of breathing with rapid response called as he was ambulating of BiPAP without oxygen ,. - Encouraged again to use incentive spirometry and flutter valve . - Remains with significant wheezing,  likely in setting of his rhinovirus causing asthma exacerbation, he has less wheezing today, will decrease his IV Solu-Medrol .  . - Volume management with hemodialysis .  Volume status much improved after receiving dialysis 2 days back-to-back. - Please see discussion below under anemia . - Continues to improve, he is currently on room air.  Symptomatic anemia  Macrocytic anemia GI bleed  Anemia of chronic kidney disease  -He reports melena, hemoglobin 6.8 on presentation, responded well to transfusion, hemoglobin remained stable - GI input greatly appreciated, continue with PPI twice daily, for endoscopy 5/30, with no concerning findings for GI bleed . - Anemia most likely in setting of chronic kidney disease, as B12, folic acid  and ferritin not low   ESRD on HD:  -Dialysis per nephrology.  Dialyzed initially back-to-back for volume overload  Acute on chronic systolic CHF  - EF 35% on recent echo.  Chest x-ray with vascular congestion.  Volume management per dialysis.  Hypokalemia:  -Corrected with dialysis.  Essential hypertension:  - Blood pressure controlled, continue home medications which includes amlodipine , Lopressor  and Imdur .  Type 2 diabetes mellitus: Diet controlled.  Continue SSI.  History of OSA: CPAP at night.  History of A-fib: Not anticoagulated per cardiology.  He will be high risk in the setting of thrombocytopenia, and possible GI bleed currently normal sinus rhythm  History of pancytopenia:  Thrombocytopenia - This is at baseline  DVT prophylaxis: SCDs Start: 06/19/23 1750   Code Status: Full Code  Family Communication:  None present at bedside.  Plan of care discussed with patient in length  and he/she verbalized understanding and agreed with it.  Status is: Inpatient Remains inpatient appropriate because: Still hypoxic, requires further workup and evaluation by GI.   Estimated body mass index is 28.19 kg/m as calculated from the following:   Height  as of this encounter: 5\' 7"  (1.702 m).   Weight as of this encounter: 81.6 kg.    Nutritional Assessment: Body mass index is 28.19 kg/m.Aaron Aas Seen by dietician.  I agree with the assessment and plan as outlined below: Nutrition Status:          Consultants:  GI nephrology Renal Procedures:  As above  Antimicrobials:  Anti-infectives (From admission, onward)    None         Subjective:  Reports some dyspnea with activity  Objective: Vitals:   06/23/23 0300 06/23/23 0400 06/23/23 0600 06/23/23 0834  BP:   (!) 151/75   Pulse: 71  73   Resp: 17  (!) 23   Temp:  98 F (36.7 C)    TempSrc:  Axillary    SpO2: 96%  94% 94%  Weight:      Height:       No intake or output data in the 24 hours ending 06/23/23 1019  Filed Weights   06/21/23 0819 06/21/23 1218 06/21/23 1453  Weight: 84.5 kg 82.5 kg 81.6 kg    Examination:  Awake Alert, Oriented X 3, frail, deconditioned Symmetrical Chest wall movement, Good air movement bilaterally, much less wheezing today RRR,No Gallops,Rubs or new Murmurs, No Parasternal Heave +ve B.Sounds, Abd Soft, No tenderness, No rebound - guarding or rigidity. No Cyanosis, Clubbing or edema, No new Rash or bruise      Data Reviewed: I have personally reviewed following labs and imaging studies  CBC: Recent Labs  Lab 06/19/23 1606 06/20/23 0426 06/21/23 0833 06/22/23 0208  WBC 2.3* 2.1* 3.3* 5.2  NEUTROABS 1.4*  --   --   --   HGB 6.8* 8.5* 8.2* 9.7*  HCT 21.4* 26.5* 25.1* 30.0*  MCV 100.5* 98.9 99.2 99.3  PLT 74* 78* 73* 115*   Basic Metabolic Panel: Recent Labs  Lab 06/19/23 1606 06/20/23 0426 06/20/23 1742 06/21/23 0833 06/22/23 0208  NA 136 136  --  133* 134*  K 2.8* 4.0  --  4.3 4.2  CL 94* 95*  --  91* 91*  CO2 31 26  --  25 27  GLUCOSE 110* 148*  --  154* 148*  BUN 10 19  --  50* 41*  CREATININE 4.05* 5.53*  --  8.78* 7.11*  CALCIUM  8.1* 8.7*  --  9.0 9.6  PHOS  --   --  5.0* 5.6*  --    GFR: Estimated  Creatinine Clearance: 11.2 mL/min (A) (by C-G formula based on SCr of 7.11 mg/dL (H)). Liver Function Tests: Recent Labs  Lab 06/20/23 1742 06/21/23 0833  ALBUMIN  3.5 3.5   No results for input(s): "LIPASE", "AMYLASE" in the last 168 hours. No results for input(s): "AMMONIA" in the last 168 hours. Coagulation Profile: No results for input(s): "INR", "PROTIME" in the last 168 hours. Cardiac Enzymes: No results for input(s): "CKTOTAL", "CKMB", "CKMBINDEX", "TROPONINI" in the last 168 hours. BNP (last 3 results) No results for input(s): "PROBNP" in the last 8760 hours. HbA1C: No results for input(s): "HGBA1C" in the last 72 hours. CBG: Recent Labs  Lab 06/20/23 0823 06/21/23 0356  GLUCAP 150* 152*   Lipid Profile: No results for input(s): "CHOL", "HDL", "LDLCALC", "TRIG", "CHOLHDL", "LDLDIRECT" in the  last 72 hours. Thyroid  Function Tests: No results for input(s): "TSH", "T4TOTAL", "FREET4", "T3FREE", "THYROIDAB" in the last 72 hours. Anemia Panel: Recent Labs    06/22/23 0208  VITAMINB12 >7,500*  FOLATE 10.8  FERRITIN 1,481*  TIBC 265  IRON 102  RETICCTPCT 1.8   Sepsis Labs: No results for input(s): "PROCALCITON", "LATICACIDVEN" in the last 168 hours.  Recent Results (from the past 240 hours)  Resp panel by RT-PCR (RSV, Flu A&B, Covid) Anterior Nasal Swab     Status: None   Collection Time: 06/19/23  4:06 PM   Specimen: Anterior Nasal Swab  Result Value Ref Range Status   SARS Coronavirus 2 by RT PCR NEGATIVE NEGATIVE Final   Influenza A by PCR NEGATIVE NEGATIVE Final   Influenza B by PCR NEGATIVE NEGATIVE Final    Comment: (NOTE) The Xpert Xpress SARS-CoV-2/FLU/RSV plus assay is intended as an aid in the diagnosis of influenza from Nasopharyngeal swab specimens and should not be used as a sole basis for treatment. Nasal washings and aspirates are unacceptable for Xpert Xpress SARS-CoV-2/FLU/RSV testing.  Fact Sheet for  Patients: BloggerCourse.com  Fact Sheet for Healthcare Providers: SeriousBroker.it  This test is not yet approved or cleared by the [place]  FDA and has been authorized for detection and/or diagnosis of SARS-CoV-2 by FDA under an Emergency Use Authorization (EUA). This EUA will remain in effect (meaning this test can be used) for the duration of the COVID-19 declaration under Section 564(b)(1) of the Act, 21 U.S.C. section 360bbb-3(b)(1), unless the authorization is terminated or revoked.     Resp Syncytial Virus by PCR NEGATIVE NEGATIVE Final    Comment: (NOTE) Fact Sheet for Patients: BloggerCourse.com  Fact Sheet for Healthcare Providers: SeriousBroker.it  This test is not yet approved or cleared by the [place]  FDA and has been authorized for detection and/or diagnosis of SARS-CoV-2 by FDA under an Emergency Use Authorization (EUA). This EUA will remain in effect (meaning this test can be used) for the duration of the COVID-19 declaration under Section 564(b)(1) of the Act, 21 U.S.C. section 360bbb-3(b)(1), unless the authorization is terminated or revoked.  Performed at [facility], [address] [place], [place] [address]   Respiratory (~20 pathogens) panel by PCR     Status: Abnormal   Collection Time: 06/19/23  4:06 PM   Specimen: Nasopharyngeal Swab; Respiratory  Result Value Ref Range Status   Adenovirus NOT DETECTED NOT DETECTED Final   Coronavirus 229E NOT DETECTED NOT DETECTED Final    Comment: (NOTE) The Coronavirus on the Respiratory Panel, DOES NOT test for the novel  Coronavirus (2019 nCoV)    Coronavirus HKU1 NOT DETECTED NOT DETECTED Final   Coronavirus NL63 NOT DETECTED NOT DETECTED Final   Coronavirus OC43 NOT DETECTED NOT DETECTED Final   Metapneumovirus NOT DETECTED NOT DETECTED Final   Rhinovirus / Enterovirus DETECTED (A) NOT  DETECTED Final   Influenza A NOT DETECTED NOT DETECTED Final   Influenza B NOT DETECTED NOT DETECTED Final   Parainfluenza Virus 1 NOT DETECTED NOT DETECTED Final   Parainfluenza Virus 2 NOT DETECTED NOT DETECTED Final   Parainfluenza Virus 3 NOT DETECTED NOT DETECTED Final   Parainfluenza Virus 4 NOT DETECTED NOT DETECTED Final   Respiratory Syncytial Virus NOT DETECTED NOT DETECTED Final   Bordetella pertussis NOT DETECTED NOT DETECTED Final   Bordetella Parapertussis NOT DETECTED NOT DETECTED Final   Chlamydophila pneumoniae NOT DETECTED NOT DETECTED Final   Mycoplasma pneumoniae NOT DETECTED NOT DETECTED Final  Comment: Performed at [facility], [address], [place], [place] [address]     Radiology Studies: No results found.   Scheduled Meds:  amLODipine   10 mg Oral Daily   Chlorhexidine  Gluconate Cloth  6 each Topical Q0600   doxazosin   4 mg Oral QHS   DULoxetine   60 mg Oral q AM   ipratropium-albuterol   3 mL Nebulization Q6H   isosorbide  mononitrate  60 mg Oral q AM   methylPREDNISolone  (SOLU-MEDROL ) injection  125 mg Intravenous Daily   metoprolol  tartrate  50 mg Oral BID   pantoprazole  (PROTONIX ) IV  40 mg Intravenous Q12H   sevelamer  carbonate  4.8 g Oral TID WC   sodium chloride  flush  3 mL Intravenous Q12H   Continuous Infusions:     LOS: 4 days   Seena Dadds, MD [facility]  06/23/2023, 10:19 AM   *Please note that this is a verbal dictation therefore any spelling or grammatical errors are due to the "Dragon Medical One" system interpretation.  Please page via Amion and do not message via secure chat for urgent patient care matters. Secure chat can be used for non urgent patient care matters.  How to contact the [facility] Attending or Consulting provider 7A - 7P or covering provider during after hours 7P -7A, for this patient?  Check the care team in [facility] and look for a) attending/consulting TR[facility] provider listed and b) the TR[facility] team listed.  Page or secure chat 7A-7P. Log into [email or web address] and use [place]'s universal password to access. If you do not have the password, please contact the hospital operator. Locate the [facility] provider you are looking for under [facility] and page to a number that you can be directly reached. If you still have difficulty reaching the provider, please page the [facility] (Director on Call) for the Hospitalists listed on amion for assistance.

## 2023-06-24 DIAGNOSIS — R0603 Acute respiratory distress: Secondary | ICD-10-CM | POA: Diagnosis not present

## 2023-06-24 LAB — RENAL FUNCTION PANEL
Albumin: 3.4 g/dL — ABNORMAL LOW (ref 3.5–5.0)
Anion gap: 15 (ref 5–15)
BUN: 54 mg/dL — ABNORMAL HIGH (ref 8–23)
CO2: 25 mmol/L (ref 22–32)
Calcium: 9.3 mg/dL (ref 8.9–10.3)
Chloride: 95 mmol/L — ABNORMAL LOW (ref 98–111)
Creatinine, Ser: 7.85 mg/dL — ABNORMAL HIGH (ref 0.61–1.24)
GFR, Estimated: 7 mL/min — ABNORMAL LOW (ref >60)
Glucose, Bld: 207 mg/dL — ABNORMAL HIGH (ref 70–99)
Phosphorus: 4.2 mg/dL (ref 2.5–4.6)
Potassium: 4.2 mmol/L (ref 3.5–5.1)
Sodium: 135 mmol/L (ref 135–145)

## 2023-06-24 LAB — CBC
HCT: 29.5 % — ABNORMAL LOW (ref 39.0–52.0)
Hemoglobin: 9.9 g/dL — ABNORMAL LOW (ref 13.0–17.0)
MCH: 33 pg (ref 26.0–34.0)
MCHC: 33.6 g/dL (ref 30.0–36.0)
MCV: 98.3 fL (ref 80.0–100.0)
Platelets: 132 10*3/uL — ABNORMAL LOW (ref 150–400)
RBC: 3 MIL/uL — ABNORMAL LOW (ref 4.22–5.81)
RDW: 14.1 % (ref 11.5–15.5)
WBC: 7.5 10*3/uL (ref 4.0–10.5)
nRBC: 1.1 % — ABNORMAL HIGH (ref 0.0–0.2)

## 2023-06-24 MED ORDER — LIDOCAINE-PRILOCAINE 2.5-2.5 % EX CREA: 1.0000 | TOPICAL_CREAM | CUTANEOUS | Status: DC | PRN

## 2023-06-24 MED ORDER — ALTEPLASE 2 MG IJ SOLR: 2.0000 mg | Freq: Once | INTRAMUSCULAR | Status: DC | PRN

## 2023-06-24 MED ORDER — PENTAFLUOROPROP-TETRAFLUOROETH EX AERO: 1.0000 | INHALATION_SPRAY | CUTANEOUS | Status: DC | PRN

## 2023-06-24 MED ORDER — LIDOCAINE HCL (PF) 1 % IJ SOLN: 5.0000 mL | INTRAMUSCULAR | Status: DC | PRN

## 2023-06-24 MED ORDER — ANTICOAGULANT SODIUM CITRATE 4% (200MG/5ML) IV SOLN: 5.0000 mL | Status: DC | PRN

## 2023-06-24 MED ORDER — NEPRO/CARBSTEADY PO LIQD: 237.0000 mL | ORAL | Status: DC | PRN

## 2023-06-24 MED ORDER — HEPARIN SODIUM (PORCINE) 1000 UNIT/ML DIALYSIS: 1000.0000 [IU] | INTRAMUSCULAR | Status: DC | PRN

## 2023-06-24 MED ORDER — HEPARIN SODIUM (PORCINE) 1000 UNIT/ML DIALYSIS: 2000.0000 [IU] | Freq: Once | INTRAMUSCULAR | Status: DC

## 2023-06-24 NOTE — Progress Notes (Signed)
   06/24/23 1513  TOC Brief Assessment  Insurance and Status Reviewed  Patient has primary care physician Yes  Home environment has been reviewed spouse  Prior level of function: has walker, rollator and wheelchair  Prior/Current Home Services No current home services  Social Drivers of Health Review SDOH reviewed no interventions necessary  Readmission risk has been reviewed Yes   PT recommending HHPT. Spoke with patient and family at bedisde. Discussed HHPT. Patient politely declined at present . If patient changes his mind before  discharge he will let nurse know. If he decides after he gets home he wants HHPT he will call PCP. Secure chatted team

## 2023-06-24 NOTE — Progress Notes (Signed)
 Physical Therapy Treatment Patient Details Name: Kirk [name] MRN: [id number] DOB: [birth year] Today's Date: 06/24/2023   History of Present Illness 63 y.o. male who presents 5/28 with shortness of breath. Admitted with Acute hypoxic respiratory failure,  Acute COPD exacerbation,  Symptomatic anemia,  Acute on chronic systolic CHF,  Rhinovirus infection with asthma exacerbation. PMH: hfref, copd, esrd on mwf dialysis, htn, osa on cpap, a fib not anticoagulated.    PT Comments  Shows significant improvement from previous visit during initial evaluation over the weekend. Ambulates 105 feet with CGA today. Requires several short standing rest breaks to complete; RW utilized to increase safety and efficiency. States he has a rollator which I recommended he use at home.  SpO2 93% on room air at rest. [facility] entirety of distance at ~92% on room air with moderate dyspnea. Immediately upon sitting sats did drop to 86% but improved to >90% in less than 1 minute. Requested staff check again later this afternoon to confirm O2 requirements. He feels much stronger today but notices some LE weakness as he fatigues. No evidence of buckling this visit as he did on initial evaluation. Hopeful this progress continues and HHPT will be appropriate once stable for d/c. Will update recs as appropriate. Patient will continue to benefit from skilled physical therapy services to further improve independence with functional mobility.    If plan is discharge home, recommend the following: A little help with walking and/or transfers;A little help with bathing/dressing/bathroom;Assistance with cooking/housework;Assist for transportation;Help with stairs or ramp for entrance;Supervision due to cognitive status   Can travel by private vehicle        Equipment Recommendations   (Now states he has RW and Rollator. Recommended Rollator use.)    Recommendations for Other Services       Precautions / Restrictions  Precautions Precautions: Fall Recall of Precautions/Restrictions: Intact Precaution/Restrictions Comments: monitor O2 Restrictions Weight Bearing Restrictions Per Provider Order: No     Mobility  Bed Mobility               General bed mobility comments: sitting EOB when PT entered room. Family present.    Transfers Overall transfer level: Needs assistance Equipment used: Rolling walker (2 wheels) Transfers: Sit to/from Stand Sit to Stand: Supervision           General transfer comment: Supervision for safety, stable upon rising, no physical assist, no sway noted.    Ambulation/Gait Ambulation/Gait assistance: Contact guard assist Gait Distance (Feet): 105 Feet Assistive device: Rolling walker (2 wheels) Gait Pattern/deviations: Step-through pattern, Decreased stride length, Trunk flexed, Wide base of support Gait velocity: decr Gait velocity interpretation: <1.31 ft/sec, indicative of household ambulator   General Gait Details: Required several short standing rest breaks to complete distance. Educated on symptom awareness and energy conservation techniques. Did not demonstrate any instances of LEs buckling today but did feel LEs were becoming weak towards end of distance. SpO2 92% through majority of distance on RA, however immediately upon sitting after walking dropped to 86%. This resolved <1 min to greater than 90% again.   Stairs             Wheelchair Mobility     Tilt Bed    Modified Rankin (Stroke Patients Only)       Balance Overall balance assessment: Needs assistance Sitting-balance support: No upper extremity supported, Feet supported Sitting balance-Leahy Scale: Good     Standing balance support: No upper extremity supported, During functional activity Standing balance-Leahy Scale: Fair Standing  balance comment: Stood without UE support.                            Communication Communication Communication:  Impaired Factors Affecting Communication: Hearing impaired  Cognition Arousal: Alert Behavior During Therapy: WFL for tasks assessed/performed   PT - Cognitive impairments: No apparent impairments                         Following commands: Intact      Cueing Cueing Techniques: Verbal cues  Exercises      General Comments General comments (skin integrity, edema, etc.): SpO2 93% on room air at rest, 86% ambulating on RA. Moderate DOE throughout.      Pertinent Vitals/Pain Pain Assessment Pain Assessment: No/denies pain    Home Living                          Prior Function            PT Goals (current goals can now be found in the care plan section) Acute Rehab PT Goals Patient Stated Goal: Go home PT Goal Formulation: With patient Time For Goal Achievement: 07/05/23 Potential to Achieve Goals: Good Progress towards PT goals: Progressing toward goals    Frequency    Min 2X/week      PT Plan      Co-evaluation              AM-PAC PT "6 Clicks" Mobility   Outcome Measure  Help needed turning from your back to your side while in a flat bed without using bedrails?: A Little Help needed moving from lying on your back to sitting on the side of a flat bed without using bedrails?: A Little Help needed moving to and from a bed to a chair (including a wheelchair)?: A Little Help needed standing up from a chair using your arms (e.g., wheelchair or bedside chair)?: A Little Help needed to walk in hospital room?: A Little Help needed climbing 3-5 steps with a railing? : A Little 6 Click Score: 18    End of Session Equipment Utilized During Treatment: Gait belt;Oxygen  Activity Tolerance: Patient tolerated treatment well Patient left: with call bell/phone within reach;in bed;with family/visitor present (Left as found when PT entered room) Nurse Communication: Mobility status (sats) PT Visit Diagnosis: Unsteadiness on feet (R26.81);Other  abnormalities of gait and mobility (R26.89);Muscle weakness (generalized) (M62.81);Difficulty in walking, not elsewhere classified (R26.2)     Time: 6578-4696 PT Time Calculation (min) (ACUTE ONLY): 18 min  Charges:    $Gait Training: 8-22 mins PT General Charges $$ ACUTE PT VISIT: 1 Visit                     Jory Ng, PT, DPT [facility]  Rehabilitation Services Physical Therapist Office: [phone] Website: [email or web address]    Kirk [name] 06/24/2023, 10:32 AM

## 2023-06-24 NOTE — Progress Notes (Signed)
 SATURATION QUALIFICATIONS: (This note is used to comply with regulatory documentation for home oxygen )  Patient Saturations on Room Air at Rest = 90%  Patient Saturations on Room Air while Ambulating = 85%  Patient Saturations on 2 Liters of oxygen  while Ambulating = 93%  Please briefly explain why patient needs home oxygen :  With ambulation, 02 dropped to 85%, pt visibly SOB and had to take breaks and rest in the hallway.  Oxygen  improved when placed on 2L [place].

## 2023-06-24 NOTE — Progress Notes (Signed)
 [facility] Progress Note  Subjective:  Seen in room No c/o's today Wants to pull 2 L w/ hd today  Vitals:   06/24/23 1030 06/24/23 1100 06/24/23 1130 06/24/23 1200  BP: (!) 150/74 (!) 147/73 (!) 156/70 (!) 167/74  Pulse: 66 68 70 78  Resp: 19 16 18  (!) 23  Temp:      TempSrc:      SpO2: 100% 100% 100% 100%  Weight:      Height:        Exam: Gen alert, no distress, on RA - 3 L [place] O2 No jvd or bruits Chest wheezing is much better, no sig rales RRR no MRG Abd soft ntnd no mass or ascites +bs Ext no sig LE or UE edema, no other edema Neuro is alert, Ox 3 , nf    LUA AVF+bruit      Renal-related home meds: Renvela  6 ac tid Losartan  25 every day Norvasc  10 every day Doxazosin  4 mg hs Hydralazine  50 tid Lopressor  50 bid Renavite every day Velphoro  3 ac tid       OP HD: MWF G-O 4h   B400   82.7kg  L AVF   Heparin  2500 Last OP HD 5/28, post wt 84.3kg Usually gets to dry wt, but wt gains are typically 3-4 kg   CXR 5/28 - vasc congestion and mild IS edema      Assessment/ Plan: Acute hypoxic resp failure: +vasc congestion + IS edema on initial CXR.  Suspected volume overload + asthma + rhinovirus. Cont to lower vol as tolerated.   Acute rhinovirus infection: per pmd Asthma: nebs, IV steroids per pmd  ESRD: HD MWF. HD today. Volume: sp HD here x 2 w/ total 5.7 L off. Last wt was below dry wt by 1kg. UF goal 2 L w/ HD today. Euvolemic to dry on exam.  Acute on chronic anemia: w/ dark stools x 1 week. Hb improved sp prbc's up to 8-10 range now.   HTN: bp's wnl, on 3 bp medications  per pmd. Stable.  Secondary hyperparathyroidism: Ca in range, cont binders   Larry Poag MD  [facility] 06/24/2023, 12:03 PM  Recent Labs  Lab 06/20/23 1742 06/21/23 0833 06/22/23 0208  HGB  --  8.2* 9.7*  ALBUMIN  3.5 3.5  --   CALCIUM   --  9.0 9.6  PHOS 5.0* 5.6*  --   CREATININE  --  8.78* 7.11*  K  --  4.3 4.2   Recent Labs  Lab 06/22/23 0208  IRON 102  TIBC  265  FERRITIN 1,481*   Inpatient medications:  amLODipine   10 mg Oral Daily   Chlorhexidine  Gluconate Cloth  6 each Topical Q0600   Chlorhexidine  Gluconate Cloth  6 each Topical Q0600   doxazosin   4 mg Oral QHS   DULoxetine   60 mg Oral q AM   [START ON 06/25/2023] heparin   2,000 Units Dialysis Once in dialysis   ipratropium-albuterol   3 mL Nebulization Q6H   isosorbide  mononitrate  60 mg Oral q AM   methylPREDNISolone  (SOLU-MEDROL ) injection  80 mg Intravenous Daily   metoprolol  tartrate  50 mg Oral BID   pantoprazole  (PROTONIX ) IV  40 mg Intravenous Q12H   sevelamer  carbonate  4.8 g Oral TID WC   sodium chloride  flush  3 mL Intravenous Q12H    anticoagulant sodium citrate       albuterol , alteplase , anticoagulant sodium citrate , diphenhydrAMINE , feeding supplement (NEPRO CARB STEADY), guaiFENesin , heparin , lidocaine  (PF), lidocaine -prilocaine , pentafluoroprop-tetrafluoroeth,  sodium chloride  flush

## 2023-06-24 NOTE — Progress Notes (Signed)
   06/24/23 1335  Vitals  Temp 98.2 F (36.8 C)  Pulse Rate 80  Resp (!) 25  BP (!) 152/65  SpO2 99 %  O2 Device Nasal Cannula  Weight 85 kg  Type of Weight Post-Dialysis  Oxygen  Therapy  O2 Flow Rate (L/min) 3 L/min  Patient Activity (if Appropriate) In bed  Pulse Oximetry Type Continuous  Post Treatment  Dialyzer Clearance Lightly streaked  Hemodialysis Intake (mL) 0 mL  Liters Processed 84  Fluid Removed (mL) 2000 mL  Tolerated HD Treatment Yes  AVG/AVF Arterial Site Held (minutes) 8 minutes  AVG/AVF Venous Site Held (minutes) 8 minutes   Received patient in bed to unit.  Alert and oriented.  Informed consent signed and in chart.   TX duration:3.5HRS  Patient tolerated well.  Transported back to the room  Alert, without acute distress.  Hand-off given to patient's nurse.   Access used: LAVF Access issues: NONE  Total UF removed: 2L Medication(s) given: NONE   Na'Shaminy T Kathia [facility]

## 2023-06-24 NOTE — Progress Notes (Deleted)
 Chief Complaint: Hospital follow-up Primary GI MD: Dr. Howard Macho  HPI:    Discussed the use of AI scribe software for clinical note transcription with the patient, who gave verbal consent to proceed.  History of Present Illness      PREVIOUS GI WORKUP    EGD 06/19/2023 inpatient for acute on chronic anemia and melena - Normal esophagus.  - Normal stomach.  - Normal examined duodenum.  - No specimens collected.  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)    Anxiety    Asthma    Asthma    moderate persistent   Carpal tunnel syndrome    Cellulitis and abscess of trunk    Cholelithiasis 07/13/2014   Chronic headaches    Cigarette smoker 07/11/2017   Stopped 2/022  - referred to start Lung cancer screening 04/2021         Debility, unspecified    Dermatophytosis of the body    Dysrhythmia    history of   Edema    End stage renal disease on dialysis [facility])    "MWF; [facility] in [place]" (10/21/2014)   Essential hypertension, benign    Generalized anxiety disorder 05/25/2022   GERD (gastroesophageal reflux disease)    Gout, unspecified    History of renal transplant    HTN (hypertension)    Hypertrophy of prostate without urinary obstruction and other lower urinary tract symptoms (LUTS)    Hypotension, unspecified    Hypoxia 03/21/2023   Impotence of organic origin    Insomnia, unspecified    Localization-related (focal) (partial) epilepsy and epileptic syndromes with complex partial seizures, without mention of intractable epilepsy    12-15-19- Wife states he has NEVER had a seizure    Lumbago    Memory loss    OSA on CPAP    Other and unspecified hyperlipidemia    controlled /managed per wife    Other chronic nonalcoholic liver disease    Other malaise and fatigue    Other nonspecific abnormal serum  enzyme levels    Pain in joint, lower leg    Pain in joint, upper arm    Pneumonia "several times"   PONV (postoperative nausea and vomiting)    Secondary hyperparathyroidism (of renal origin)    Shortness of breath    Sleep apnea    wears cpap    Tension headache    Unspecified constipation    Unspecified essential hypertension    Unspecified hereditary and idiopathic peripheral neuropathy    Unspecified vitamin D  deficiency     Past Surgical History:  Procedure Laterality Date   A/V FISTULAGRAM N/A 03/28/2023   Procedure: A/V Fistulagram;  Surgeon: Patrick Boor, MD;  Location: [facility] INVASIVE CV LAB;  Service: Cardiovascular;  Laterality: N/A;   AV FISTULA PLACEMENT Left ?2010   "forearm; at [facility]"   BACK SURGERY     CARDIAC CATHETERIZATION  03/21/2011   CHOLECYSTECTOMY N/A 10/21/2014   Procedure: LAPAROSCOPIC CHOLECYSTECTOMY WITH INTRAOPERATIVE CHOLANGIOGRAM;  Surgeon: Lillette Reid III, MD;  Location: [facility] OR;  Service: General;  Laterality: N/A;   COLONOSCOPY     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   IR ANGIO INTRA EXTRACRAN SEL INTERNAL CAROTID UNI R MOD SED  11/30/2021   IR ANGIOGRAM FOLLOW UP STUDY  11/30/2021  IR AV DIALY SHUNT INTRO NEEDLE/INTRACATH INITIAL W/PTA/IMG LEFT  12/13/2022   IR BONE MARROW BIOPSY & ASPIRATION  03/27/2022   IR NEURO EACH ADD'L AFTER BASIC UNI RIGHT (MS)  11/30/2021   IR TRANSCATH/EMBOLIZ  11/30/2021   IR US  GUIDE VASC ACCESS LEFT  12/13/2022   KIDNEY TRANSPLANT  08/17/2011   [facility]    LAPAROSCOPIC CHOLECYSTECTOMY  10/21/2014   w/IOC   LEFT HEART CATH AND CORONARY ANGIOGRAPHY N/A 04/09/2023   Procedure: LEFT HEART CATH AND CORONARY ANGIOGRAPHY;  Surgeon: Arleen Lacer, MD;  Location: [facility] INVASIVE CV LAB;  Service: Cardiovascular;  Laterality: N/A;   LEFT HEART CATHETERIZATION WITH CORONARY ANGIOGRAM N/A 03/21/2011   Procedure: LEFT HEART CATHETERIZATION WITH CORONARY ANGIOGRAM;  Surgeon: Hazle Lites, MD;  Location:  [facility] CATH LAB;  Service: Cardiovascular;  Laterality: N/A;   NEPHRECTOMY  08/2013   removed transplaned kidney   PERIPHERAL VASCULAR BALLOON ANGIOPLASTY Left 03/28/2023   Procedure: PERIPHERAL VASCULAR BALLOON ANGIOPLASTY;  Surgeon: Patrick Boor, MD;  Location: [facility] INVASIVE CV LAB;  Service: Cardiovascular;  Laterality: Left;  80% outflow cephalic   POLYPECTOMY     POSTERIOR FUSION CERVICAL SPINE  06/25/2012   for spinal stenosis   RADIOLOGY WITH ANESTHESIA N/A 11/30/2021   Procedure: RIGHT MIDDLE MENINGEAL ARTERY EMBOLIZATION;  Surgeon: Radiologist, Medication, MD;  Location: [facility] OR;  Service: Radiology;  Laterality: N/A;   VASECTOMY  2010    No current facility-administered medications for this visit.   No current outpatient medications on file.   Facility-Administered Medications Ordered in Other Visits  Medication Dose Route Frequency Provider Last Rate Last Admin   albuterol  (PROVENTIL ) (2.5 MG/3ML) 0.083% nebulizer solution 2.5 mg  2.5 mg Nebulization Q4H PRN Elgergawy, Dawood S, MD   2.5 mg at 06/21/23 1422   alteplase  (CATHFLO ACTIVASE ) injection 2 mg  2 mg Intracatheter Once PRN Chucky Craver, MD       amLODipine  (NORVASC ) tablet 10 mg  10 mg Oral Daily Janeane Mealy, MD   10 mg at 06/23/23 1610   anticoagulant sodium citrate  solution 5 mL  5 mL Intracatheter PRN Chucky Craver, MD       Chlorhexidine  Gluconate Cloth 2 % PADS 6 each  6 each Topical Q0600 Janeane Mealy, MD   6 each at 06/23/23 9604   Chlorhexidine  Gluconate Cloth 2 % PADS 6 each  6 each Topical Q0600 Chucky Craver, MD   6 each at 06/24/23 0511   diphenhydrAMINE  (BENADRYL ) capsule 25 mg  25 mg Oral QHS PRN Smith, Rondell A, MD   25 mg at 06/23/23 2242   doxazosin  (CARDURA ) tablet 4 mg  4 mg Oral QHS Janeane Mealy, MD   4 mg at 06/23/23 2242   DULoxetine  (CYMBALTA ) DR capsule 60 mg  60 mg Oral q AM Wouk, Noah Bedford, MD   60 mg at 06/24/23 0509   feeding supplement (NEPRO CARB STEADY) liquid 237 mL  237  mL Oral PRN Chucky Craver, MD       guaiFENesin  (ROBITUSSIN) 100 MG/5ML liquid 5 mL  5 mL Oral Q4H PRN Opyd, Timothy S, MD   5 mL at 06/24/23 5409   heparin  injection 1,000 Units  1,000 Units Intracatheter PRN Chucky Craver, MD       Cecily Cohen ON 06/25/2023] heparin  injection 2,000 Units  2,000 Units Dialysis Once in dialysis Chucky Craver, MD       ipratropium-albuterol  (DUONEB) 0.5-2.5 (3) MG/3ML nebulizer solution 3 mL  3 mL Nebulization  Q6H Wouk, Haynes Lips, MD   3 mL at 06/24/23 1610   isosorbide  mononitrate (IMDUR ) 24 hr tablet 60 mg  60 mg Oral q AM Wouk, Haynes Lips, MD   60 mg at 06/23/23 9604   lidocaine  (PF) (XYLOCAINE ) 1 % injection 5 mL  5 mL Intradermal PRN Chucky Craver, MD       lidocaine -prilocaine  (EMLA ) cream 1 Application  1 Application Topical PRN Chucky Craver, MD       methylPREDNISolone  sodium succinate (SOLU-MEDROL ) 125 mg/2 mL injection 80 mg  80 mg Intravenous Daily Elgergawy, Dawood S, MD   80 mg at 06/24/23 5409   metoprolol  tartrate (LOPRESSOR ) tablet 50 mg  50 mg Oral BID Janeane Mealy, MD   50 mg at 06/23/23 2242   pantoprazole  (PROTONIX ) injection 40 mg  40 mg Intravenous Q12H Janeane Mealy, MD   40 mg at 06/24/23 8119   pentafluoroprop-tetrafluoroeth (GEBAUERS) aerosol 1 Application  1 Application Topical PRN Chucky Craver, MD       sevelamer  carbonate (RENVELA ) powder PACK 4.8 g  4.8 g Oral TID WC Elgergawy, Ardia Kraft, MD   4.8 g at 06/24/23 0800   sodium chloride  flush (NS) 0.9 % injection 10 mL  10 mL Intravenous Q12H Geary Kells, PA-C       sodium chloride  flush (NS) 0.9 % injection 3 mL  3 mL Intravenous Q12H Wouk, Haynes Lips, MD   3 mL at 06/24/23 1478   sodium chloride  flush (NS) 0.9 % injection 3 mL  3 mL Intravenous PRN Janeane Mealy, MD        Allergies as of 06/25/2023 - Review Complete 06/21/2023  Allergen Reaction Noted   Codeine Nausea And Vomiting 05/20/2007   Hydrocodone -acetaminophen  Nausea And Vomiting 11/01/2020     Family History  Adopted: Yes  Problem Relation Age of Onset   Colon cancer Neg Hx    Esophageal cancer Neg Hx    Rectal cancer Neg Hx    Stomach cancer Neg Hx    Colon polyps Neg Hx     Social History   Socioeconomic History   Marital status: Married    Spouse name: Glynis Lass   Number of children: 3   Years of education: Not on file   Highest education level: Not on file  Occupational History   Occupation: disabled    Employer: DISABLED  Tobacco Use   Smoking status: Former    Current packs/day: 0.00    Average packs/day: 0.5 packs/day for 32.0 years (16.0 ttl pk-yrs)    Types: Cigarettes    Start date: 29    Quit date: 02/24/2020    Years since quitting: 3.3   Smokeless tobacco: Never  Vaping Use   Vaping status: Never Used  Substance and Sexual Activity   Alcohol use: No    Alcohol/week: 0.0 standard drinks of alcohol   Drug use: No   Sexual activity: Yes  Other Topics Concern   Not on file  Social History Narrative   Drinks 1 cup of caffeine daily.   Social Drivers of Health   Financial Resource Strain: Medium Risk (11/02/2021)   Overall Financial Resource Strain (CARDIA)    Difficulty of Paying Living Expenses: Somewhat hard  Food Insecurity: No Food Insecurity (06/20/2023)   Hunger Vital Sign    Worried About Running Out of Food in the Last Year: Never true    Ran Out of Food in the Last Year: Never true  Transportation Needs: No Transportation Needs (06/20/2023)  PRAPARE - Administrator, Civil Service (Medical): No    Lack of Transportation (Non-Medical): No  Physical Activity: Insufficiently Active (06/25/2017)   Exercise Vital Sign    Days of Exercise per Week: 3 days    Minutes of Exercise per Session: 20 min  Stress: No Stress Concern Present (06/25/2017)   [employer] of Occupational Health - Occupational Stress Questionnaire    Feeling of Stress : Only a little  Social Connections: Patient Declined (03/17/2023)   Social  Connection and Isolation Panel [NHANES]    Frequency of Communication with Friends and Family: Patient declined    Frequency of Social Gatherings with Friends and Family: Patient declined    Attends Religious Services: Patient declined    Database administrator or Organizations: Patient declined    Attends Banker Meetings: Patient declined    Marital Status: Patient declined  Intimate Partner Violence: Not At Risk (06/20/2023)   Humiliation, Afraid, Rape, and Kick questionnaire    Fear of Current or Ex-Partner: No    Emotionally Abused: No    Physically Abused: No    Sexually Abused: No    Review of Systems:    Constitutional: No weight loss, fever, chills, weakness or fatigue HEENT: Eyes: No change in vision               Ears, Nose, Throat:  No change in hearing or congestion Skin: No rash or itching Cardiovascular: No chest pain, chest pressure or palpitations   Respiratory: No SOB or cough Gastrointestinal: See HPI and otherwise negative Genitourinary: No dysuria or change in urinary frequency Neurological: No headache, dizziness or syncope Musculoskeletal: No new muscle or joint pain Hematologic: No bleeding or bruising Psychiatric: No history of depression or anxiety    Physical Exam:  Vital signs: There were no vitals taken for this visit.  Constitutional: NAD, alert and cooperative Head:  Normocephalic and atraumatic. Eyes:   PEERL, EOMI. No icterus. Conjunctiva pink. Respiratory: Respirations even and unlabored. Lungs clear to auscultation bilaterally.   No wheezes, crackles, or rhonchi.  Cardiovascular:  Regular rate and rhythm. No peripheral edema, cyanosis or pallor.  Gastrointestinal:  Soft, nondistended, nontender. No rebound or guarding. Normal bowel sounds. No appreciable masses or hepatomegaly. Rectal:  Declines Msk:  Symmetrical without gross deformities. Without edema, no deformity or joint abnormality.  Neurologic:  Alert and  oriented x4;   grossly normal neurologically.  Skin:   Dry and intact without significant lesions or rashes. Psychiatric: Oriented to person, place and time. Demonstrates good judgement and reason without abnormal affect or behaviors.  Physical Exam    RELEVANT LABS AND IMAGING: CBC    Component Value Date/Time   WBC 5.2 06/22/2023 0208   RBC 3.02 (L) 06/22/2023 0208   RBC 3.00 (L) 06/22/2023 0208   HGB 9.7 (L) 06/22/2023 0208   HGB 10.4 (L) 01/31/2023 1414   HCT 30.0 (L) 06/22/2023 0208   PLT 115 (L) 06/22/2023 0208   PLT 100 (L) 01/31/2023 1414   MCV 99.3 06/22/2023 0208   MCH 32.1 06/22/2023 0208   MCHC 32.3 06/22/2023 0208   RDW 14.2 06/22/2023 0208   RDW 16.7 (H) 11/26/2013 0914   LYMPHSABS 0.5 (L) 06/19/2023 1606   LYMPHSABS 1.3 11/26/2013 0914   MONOABS 0.3 06/19/2023 1606   EOSABS 0.1 06/19/2023 1606   EOSABS 0.2 11/26/2013 0914   BASOSABS 0.0 06/19/2023 1606   BASOSABS 0.1 11/26/2013 0914    CMP  Component Value Date/Time   NA 134 (L) 06/22/2023 0208   NA 136 11/26/2013 0914   K 4.2 06/22/2023 0208   CL 91 (L) 06/22/2023 0208   CO2 27 06/22/2023 0208   GLUCOSE 148 (H) 06/22/2023 0208   BUN 41 (H) 06/22/2023 0208   BUN 66 (A) 05/25/2015 0000   BUN 66 (A) 05/25/2015 0000   CREATININE 7.11 (H) 06/22/2023 0208   CREATININE 8.54 (HH) 01/31/2023 1414   CREATININE 10.02 (H) 01/29/2023 1130   CALCIUM  9.6 06/22/2023 0208   CALCIUM  8.8 06/08/2015 0000   CALCIUM  7.9 (L) 01/19/2010 1016   PROT 6.1 (L) 03/18/2023 0236   PROT 6.7 08/27/2013 1351   ALBUMIN  3.5 06/21/2023 0833   ALBUMIN  3.6 08/27/2013 1351   AST 29 03/18/2023 0236   AST 14 (L) 01/31/2023 1414   ALT 19 03/18/2023 0236   ALT 7 01/31/2023 1414   ALKPHOS 51 03/18/2023 0236   BILITOT 0.9 03/18/2023 0236   BILITOT 0.6 01/31/2023 1414   GFRNONAA 8 (L) 06/22/2023 0208   GFRNONAA 7 (L) 01/31/2023 1414   GFRAA 4 (L) 07/15/2019 0704     Assessment/Plan:   63 y.o. year old male with a medical history including  but not limited to colon polyps, ESRD on HD, PAF, chronic systolic heart failure, HTN, diabetes, OSA, COPD, metastatic breast cancer presents for hospital follow-up  Acute on chronic anemia  Pancytopenia Follows with hematology thought to be due to multiple medical illnesses  ESRD on HD MWF  Chronic systolic heart failure  Type 2 diabetes  Paroxysmal A-fib  COPD    Tyberius Ryner [facility] 06/24/2023, 10:07 AM  Cc: Ngetich, Dinah C, NP

## 2023-06-24 NOTE — Progress Notes (Signed)
 PROGRESS NOTE    Kirk [name]  GNF:[id number] DOB: [birth year] DOA: 06/19/2023 PCP: Estil Heman, NP   Brief Narrative:   Kirk [name] is a 63 y.o. male with medical history significant for hfref, copd, esrd on [facility] dialysis, htn, osa on cpap, a fib not anticoagulated, who presents with the above.   Reports feeling ill for several days, feeling short of breath, at rest but worse with ambulation. Some cough as well, no fevers. No vomiting or diarrhea but has had a week of black stool. Is on asa, denies nsaids.  Patient presents from HD center during dialysis session due to dyspnea, increased work of breathing, requiring BiPAP on presentation, his workup significant for volume overload, COPD/probation and anemia .  Assessment & Plan:   Principal Problem:   Respiratory distress Active Problems:   OSA on CPAP   Essential hypertension   Alports syndrome   Obesity   Type 2 diabetes mellitus with diabetic nephropathy, without long-term current use of insulin  (HCC)   SCC (squamous cell carcinoma), arm   Narcotic drug use   ESRD (end stage renal disease) (HCC)   Chronic systolic heart failure (HCC)   Stage IV carcinoma of breast (HCC)   COPD (chronic obstructive pulmonary disease) (HCC)   PAF (paroxysmal atrial fibrillation) (HCC)   Anemia due to chronic blood loss   Melena  Acute hypoxic respiratory failure Acute COPD exacerbation Symptomatic anemia Acute on chronic systolic CHF Rhinovirus infection with asthma exacerbation -Patient presents with acute respiratory failure with hypoxia, initially requiring BiPAP, currently on oxygen , oxygen  requirement post BiPAP as well, still requiring oxygen  intermittently, so we will ambulate in the hallway today to see if he qualifies for oxygen  on discharge or not.  - Encouraged again to use incentive spirometry and flutter valve . - Patient much improved remains with significant wheezing, likely in setting of his rhinovirus causing asthma  exacerbation, he has less wheezing today, will decrease his IV Solu-Medrol .  . - Volume management with hemodialysis .  Volume status much improved after receiving dialysis 2 days back-to-back. - Please see discussion below under anemia . - Continues to improve, he is currently on room air.  Does appear whenever he ambulates he will need oxygen , so we will do 6-minute walk test today and see if he qualifies for home oxygen .  Symptomatic anemia  Macrocytic anemia GI bleed  Anemia of chronic kidney disease  -He reports melena, hemoglobin 6.8 on presentation, responded well to transfusion, hemoglobin remained stable - GI input greatly appreciated, continue with PPI twice daily, for endoscopy 5/30, with no concerning findings for GI bleed . - Anemia most likely in setting of chronic kidney disease, as B12, folic acid  and ferritin not low   ESRD on HD:  -Dialysis per nephrology.  Dialyzed initially back-to-back for volume overload  Acute on chronic systolic CHF  - EF 35% on recent echo.  Chest x-ray with vascular congestion.  Volume management per dialysis.  Hypokalemia:  -Corrected with dialysis.  Essential hypertension:  - Blood pressure controlled, continue home medications which includes amlodipine , Lopressor  and Imdur .  Type 2 diabetes mellitus: Diet controlled.  Continue SSI.  History of OSA: CPAP at night.  History of A-fib: Not anticoagulated per cardiology.  He will be high risk in the setting of thrombocytopenia, and possible GI bleed currently normal sinus rhythm  History of pancytopenia:  Thrombocytopenia - This is at baseline  DVT prophylaxis: SCDs Start: 06/19/23 1750   Code Status: Full Code  Family Communication: Discussed with wife at bedside  Status is: Inpatient Remains inpatient appropriate because: Still hypoxic, requires further workup and evaluation by GI.   Estimated body mass index is 30.04 kg/m as calculated from the following:   Height as of this  encounter: 5\' 7"  (1.702 m).   Weight as of this encounter: 87 kg.    Nutritional Assessment: Body mass index is 30.04 kg/m.Kirk [name] Seen by dietician.  I agree with the assessment and plan as outlined below: Nutrition Status:          Consultants:  GI nephrology Renal Procedures:  As above  Antimicrobials:  Anti-infectives (From admission, onward)    None         Subjective:  Reports some dyspnea with activity  Objective: Vitals:   06/24/23 0940 06/24/23 0950 06/24/23 1000 06/24/23 1030  BP: (!) 153/71 (!) 155/69 (!) 155/72 (!) 150/74  Pulse: 77 72 75 66  Resp: 20 18 15 19   Temp: 97.8 F (36.6 C)     TempSrc:      SpO2: 97% 98% 98% 100%  Weight: 87 kg     Height:        Intake/Output Summary (Last 24 hours) at 06/24/2023 1035 Last data filed at 06/24/2023 4403 Gross per 24 hour  Intake 120 ml  Output 0 ml  Net 120 ml    Filed Weights   06/21/23 1218 06/21/23 1453 06/24/23 0940  Weight: 82.5 kg 81.6 kg 87 kg    Examination:  Awake Alert, Oriented X 3, frail, deconditioned Symmetrical Chest wall movement, proved air entry, some minimal wheezing still present RRR,No Gallops,Rubs or new Murmurs, No Parasternal Heave +ve B.Sounds, Abd Soft, No tenderness, No rebound - guarding or rigidity. No Cyanosis, Clubbing or edema, No new Rash or bruise       Data Reviewed: I have personally reviewed following labs and imaging studies  CBC: Recent Labs  Lab 06/19/23 1606 06/20/23 0426 06/21/23 0833 06/22/23 0208  WBC 2.3* 2.1* 3.3* 5.2  NEUTROABS 1.4*  --   --   --   HGB 6.8* 8.5* 8.2* 9.7*  HCT 21.4* 26.5* 25.1* 30.0*  MCV 100.5* 98.9 99.2 99.3  PLT 74* 78* 73* 115*   Basic Metabolic Panel: Recent Labs  Lab 06/19/23 1606 06/20/23 0426 06/20/23 1742 06/21/23 0833 06/22/23 0208  NA 136 136  --  133* 134*  K 2.8* 4.0  --  4.3 4.2  CL 94* 95*  --  91* 91*  CO2 31 26  --  25 27  GLUCOSE 110* 148*  --  154* 148*  BUN 10 19  --  50* 41*  CREATININE  4.05* 5.53*  --  8.78* 7.11*  CALCIUM  8.1* 8.7*  --  9.0 9.6  PHOS  --   --  5.0* 5.6*  --    GFR: Estimated Creatinine Clearance: 11.5 mL/min (A) (by C-G formula based on SCr of 7.11 mg/dL (H)). Liver Function Tests: Recent Labs  Lab 06/20/23 1742 06/21/23 0833  ALBUMIN  3.5 3.5   No results for input(s): "LIPASE", "AMYLASE" in the last 168 hours. No results for input(s): "AMMONIA" in the last 168 hours. Coagulation Profile: No results for input(s): "INR", "PROTIME" in the last 168 hours. Cardiac Enzymes: No results for input(s): "CKTOTAL", "CKMB", "CKMBINDEX", "TROPONINI" in the last 168 hours. BNP (last 3 results) No results for input(s): "PROBNP" in the last 8760 hours. HbA1C: No results for input(s): "HGBA1C" in the last 72 hours. CBG: Recent Labs  Lab  06/20/23 0823 06/21/23 0356  GLUCAP 150* 152*   Lipid Profile: No results for input(s): "CHOL", "HDL", "LDLCALC", "TRIG", "CHOLHDL", "LDLDIRECT" in the last 72 hours. Thyroid  Function Tests: No results for input(s): "TSH", "T4TOTAL", "FREET4", "T3FREE", "THYROIDAB" in the last 72 hours. Anemia Panel: Recent Labs    06/22/23 0208  VITAMINB12 >7,500*  FOLATE 10.8  FERRITIN 1,481*  TIBC 265  IRON 102  RETICCTPCT 1.8   Sepsis Labs: No results for input(s): "PROCALCITON", "LATICACIDVEN" in the last 168 hours.  Recent Results (from the past 240 hours)  Resp panel by RT-PCR (RSV, Flu A&B, Covid) Anterior Nasal Swab     Status: None   Collection Time: 06/19/23  4:06 PM   Specimen: Anterior Nasal Swab  Result Value Ref Range Status   SARS Coronavirus 2 by RT PCR NEGATIVE NEGATIVE Final   Influenza A by PCR NEGATIVE NEGATIVE Final   Influenza B by PCR NEGATIVE NEGATIVE Final    Comment: (NOTE) The Xpert Xpress SARS-CoV-2/FLU/RSV plus assay is intended as an aid in the diagnosis of influenza from Nasopharyngeal swab specimens and should not be used as a sole basis for treatment. Nasal washings and aspirates are  unacceptable for Xpert Xpress SARS-CoV-2/FLU/RSV testing.  Fact Sheet for Patients: BloggerCourse.com  Fact Sheet for Healthcare Providers: SeriousBroker.it  This test is not yet approved or cleared by the [place]  FDA and has been authorized for detection and/or diagnosis of SARS-CoV-2 by FDA under an Emergency Use Authorization (EUA). This EUA will remain in effect (meaning this test can be used) for the duration of the COVID-19 declaration under Section 564(b)(1) of the Act, 21 U.S.C. section 360bbb-3(b)(1), unless the authorization is terminated or revoked.     Resp Syncytial Virus by PCR NEGATIVE NEGATIVE Final    Comment: (NOTE) Fact Sheet for Patients: BloggerCourse.com  Fact Sheet for Healthcare Providers: SeriousBroker.it  This test is not yet approved or cleared by the [place]  FDA and has been authorized for detection and/or diagnosis of SARS-CoV-2 by FDA under an Emergency Use Authorization (EUA). This EUA will remain in effect (meaning this test can be used) for the duration of the COVID-19 declaration under Section 564(b)(1) of the Act, 21 U.S.C. section 360bbb-3(b)(1), unless the authorization is terminated or revoked.  Performed at [facility], [address] [place], [place] [address]   Respiratory (~20 pathogens) panel by PCR     Status: Abnormal   Collection Time: 06/19/23  4:06 PM   Specimen: Nasopharyngeal Swab; Respiratory  Result Value Ref Range Status   Adenovirus NOT DETECTED NOT DETECTED Final   Coronavirus 229E NOT DETECTED NOT DETECTED Final    Comment: (NOTE) The Coronavirus on the Respiratory Panel, DOES NOT test for the novel  Coronavirus (2019 nCoV)    Coronavirus HKU1 NOT DETECTED NOT DETECTED Final   Coronavirus NL63 NOT DETECTED NOT DETECTED Final   Coronavirus OC43 NOT DETECTED NOT DETECTED Final   Metapneumovirus NOT  DETECTED NOT DETECTED Final   Rhinovirus / Enterovirus DETECTED (A) NOT DETECTED Final   Influenza A NOT DETECTED NOT DETECTED Final   Influenza B NOT DETECTED NOT DETECTED Final   Parainfluenza Virus 1 NOT DETECTED NOT DETECTED Final   Parainfluenza Virus 2 NOT DETECTED NOT DETECTED Final   Parainfluenza Virus 3 NOT DETECTED NOT DETECTED Final   Parainfluenza Virus 4 NOT DETECTED NOT DETECTED Final   Respiratory Syncytial Virus NOT DETECTED NOT DETECTED Final   Bordetella pertussis NOT DETECTED NOT DETECTED Final   Bordetella  Parapertussis NOT DETECTED NOT DETECTED Final   Chlamydophila pneumoniae NOT DETECTED NOT DETECTED Final   Mycoplasma pneumoniae NOT DETECTED NOT DETECTED Final    Comment: Performed at [facility], [address] [place], [place] [address]     Radiology Studies: No results found.   Scheduled Meds:  amLODipine   10 mg Oral Daily   Chlorhexidine  Gluconate Cloth  6 each Topical Q0600   Chlorhexidine  Gluconate Cloth  6 each Topical Q0600   doxazosin   4 mg Oral QHS   DULoxetine   60 mg Oral q AM   [START ON 06/25/2023] heparin   2,000 Units Dialysis Once in dialysis   ipratropium-albuterol   3 mL Nebulization Q6H   isosorbide  mononitrate  60 mg Oral q AM   methylPREDNISolone  (SOLU-MEDROL ) injection  80 mg Intravenous Daily   metoprolol  tartrate  50 mg Oral BID   pantoprazole  (PROTONIX ) IV  40 mg Intravenous Q12H   sevelamer  carbonate  4.8 g Oral TID WC   sodium chloride  flush  3 mL Intravenous Q12H   Continuous Infusions:  anticoagulant sodium citrate         LOS: 5 days   Seena Dadds, MD [facility]  06/24/2023, 10:35 AM   *Please note that this is a verbal dictation therefore any spelling or grammatical errors are due to the "Dragon Medical One" system interpretation.  Please page via Amion and do not message via secure chat for urgent patient care matters. Secure chat can be used for non urgent patient care matters.  How to contact  the [facility] Attending or Consulting provider 7A - 7P or covering provider during after hours 7P -7A, for this patient?  Check the care team in [facility] and look for a) attending/consulting [facility] provider listed and b) the [facility] team listed. Page or secure chat 7A-7P. Log into [email or web address] and use [place]'s universal password to access. If you do not have the password, please contact the hospital operator. Locate the [facility] provider you are looking for under [facility] and page to a number that you can be directly reached. If you still have difficulty reaching the provider, please page the [facility] (Director on Call) for the Hospitalists listed on amion for assistance.

## 2023-06-25 ENCOUNTER — Other Ambulatory Visit (HOSPITAL_COMMUNITY): Payer: Self-pay

## 2023-06-25 ENCOUNTER — Ambulatory Visit: Admitting: Gastroenterology

## 2023-06-25 ENCOUNTER — Encounter (HOSPITAL_COMMUNITY): Payer: Self-pay | Admitting: Gastroenterology

## 2023-06-25 ENCOUNTER — Ambulatory Visit (HOSPITAL_BASED_OUTPATIENT_CLINIC_OR_DEPARTMENT_OTHER): Payer: Medicare Other | Admitting: Family

## 2023-06-25 DIAGNOSIS — R0603 Acute respiratory distress: Secondary | ICD-10-CM | POA: Diagnosis not present

## 2023-06-25 MED ORDER — PANTOPRAZOLE SODIUM 40 MG PO TBEC
40.0000 mg | DELAYED_RELEASE_TABLET | Freq: Every day | ORAL | 0 refills | Status: DC
  Filled 2023-06-25: qty 90, 90d supply, fill #0

## 2023-06-25 MED ORDER — IPRATROPIUM-ALBUTEROL 0.5-2.5 (3) MG/3ML IN SOLN
3.0000 mL | Freq: Four times a day (QID) | RESPIRATORY_TRACT | 0 refills | Status: DC
  Filled 2023-06-25: qty 90, 8d supply, fill #0

## 2023-06-25 MED ORDER — PANTOPRAZOLE SODIUM 40 MG PO TBEC: 40.0000 mg | DELAYED_RELEASE_TABLET | Freq: Two times a day (BID) | ORAL | Status: DC

## 2023-06-25 MED ORDER — ALBUTEROL SULFATE (2.5 MG/3ML) 0.083% IN NEBU
2.5000 mg | INHALATION_SOLUTION | RESPIRATORY_TRACT | 0 refills | Status: DC | PRN
  Filled 2023-06-25: qty 90, 6d supply, fill #0

## 2023-06-25 MED ORDER — PREDNISONE 10 MG (21) PO TBPK
ORAL_TABLET | ORAL | 0 refills | Status: DC
  Filled 2023-06-25: qty 21, 6d supply, fill #0

## 2023-06-25 NOTE — Discharge Summary (Signed)
 Physician Discharge Summary  Kirk [name]:[id number] DOB: 04-Jun-1960 DOA: 06/19/2023  PCP: Estil Heman, NP  Admit date: 06/19/2023 Discharge date: 06/25/2023  Admitted From: (Home) Disposition:  (Home )  Recommendations for Outpatient Follow-up:  Follow up with PCP in 1-2 weeks Please obtain BMP/CBC in one week   Home Health: He declined home health PT, but would like to have outpatient PT at [facility]. Equipment/Devices: DME nebs and oxygen  CODE STATUS: full Diet recommendation: renal diet   Brief/Interim Summary:  Kirk [name] is a 63 y.o. male with medical history significant for hfref, copd, esrd on mwf dialysis, htn, osa on cpap, a fib not anticoagulated, who presents with the above.   Reports feeling ill for several days, feeling short of breath, at rest but worse with ambulation. Some cough as well, no fevers. No vomiting or diarrhea but has had a week of black stool. Is on asa, denies nsaids.  Patient presents from HD center during dialysis session due to dyspnea, increased work of breathing, requiring BiPAP on presentation, his workup significant for volume overload, asthma exacerbation, likely provoked by rhinovirus, and anemia .  Workup significant for anemia as well, but endoscopy with no acute findings, please see discussion below.   Acute hypoxic respiratory failure Asthma exacerbation Symptomatic anemia Acute on chronic systolic CHF Rhinovirus infection with asthma exacerbation -Patient presents with acute respiratory failure with hypoxia, initially requiring BiPAP, currently on oxygen , oxygen  requirement post BiPAP as well, still requiring oxygen  intermittently, ambulated in the hallway, he is requiring oxygen  which has been arranged at time of discharge  - Encouraged to take his incentive spirometer and flutter valve and keep using it at home- -currently dyspneic on presentation with wheezing, this has improved with steroids, he will be discharged  on steroid taper - Volume management with hemodialysis .  Volume status much improved after receiving dialysis 2 days back-to-back.  He is to continue his hemodialysis on regular schedule Monday Wednesday Friday as an outpatient - Please see discussion below under anemia . - Will be discharged on prednisone  taper, and as needed nebs and oxygen    Symptomatic anemia  Macrocytic anemia GI bleed  Anemia of chronic kidney disease  -He reports melena, hemoglobin 6.8 on presentation, responded well to transfusion, hemoglobin remained stable - GI input greatly appreciated, continue with PPI , went for endoscopy 5/30, with no concerning findings for GI bleed . - Anemia most likely in setting of chronic kidney disease, as B12, folic acid  and ferritin not low    ESRD on HD:  -Dialysis per nephrology.  Dialyzed initially back-to-back for volume overload   Acute on chronic systolic CHF  - EF 35% on recent echo.  Chest x-ray with vascular congestion.  Volume management per dialysis.   Hypokalemia:  -Corrected with dialysis.   Essential hypertension:  - Blood pressure controlled,    Type 2 diabetes mellitus: Diet controlled.    History of OSA: CPAP at night.   History of A-fib: Not anticoagulated per cardiology.  He will be high risk in the setting of thrombocytopenia, and possible GI bleed currently normal sinus rhythm   History of pancytopenia:  Thrombocytopenia - This is at baseline     Discharge Diagnoses:  Principal Problem:   Respiratory distress Active Problems:   OSA on CPAP   Essential hypertension   Alports syndrome   Obesity   Type 2 diabetes mellitus with diabetic nephropathy, without long-term current use of insulin  (HCC)   SCC (squamous cell  carcinoma), arm   Narcotic drug use   ESRD (end stage renal disease) (HCC)   Chronic systolic heart failure (HCC)   Stage IV carcinoma of breast (HCC)   COPD (chronic obstructive pulmonary disease) (HCC)   PAF (paroxysmal atrial  fibrillation) (HCC)   Anemia due to chronic blood loss   Melena    Discharge Instructions  Discharge Instructions     Diet - low sodium heart healthy   Complete by: As directed    Discharge instructions   Complete by: As directed    Follow with Primary MD Ngetich, Dinah C, NP in 7 days   Get CBC, CMP, 2 view Chest X ray checked  by Primary MD next visit.    Activity: As tolerated with Full fall precautions use walker/cane & assistance as needed   Disposition Home    Diet: renal diet with fluid restriction 1200cc/day   On your next visit with your primary care physician please Get Medicines reviewed and adjusted.   Please request your Prim.MD to go over all Hospital Tests and Procedure/Radiological results at the follow up, please get all Hospital records sent to your Prim MD by signing hospital release before you go home.   If you experience worsening of your admission symptoms, develop shortness of breath, life threatening emergency, suicidal or homicidal thoughts you must seek medical attention immediately by calling 911 or calling your MD immediately  if symptoms less severe.  You Must read complete instructions/literature along with all the possible adverse reactions/side effects for all the Medicines you take and that have been prescribed to you. Take any new Medicines after you have completely understood and accpet all the possible adverse reactions/side effects.   Do not drive, operating heavy machinery, perform activities at heights, swimming or participation in water  activities or provide baby sitting services if your were admitted for syncope or siezures until you have seen by Primary MD or a Neurologist and advised to do so again.  Do not drive when taking Pain medications.    Do not take more than prescribed Pain, Sleep and Anxiety Medications  Special Instructions: If you have smoked or chewed Tobacco  in the last 2 yrs please stop smoking, stop any regular  Alcohol  and or any Recreational drug use.  Wear Seat belts while driving.   Please note  You were cared for by a hospitalist during your hospital stay. If you have any questions about your discharge medications or the care you received while you were in the hospital after you are discharged, you can call the unit and asked to speak with the hospitalist on call if the hospitalist that took care of you is not available. Once you are discharged, your primary care physician will handle any further medical issues. Please note that NO REFILLS for any discharge medications will be authorized once you are discharged, as it is imperative that you return to your primary care physician (or establish a relationship with a primary care physician if you do not have one) for your aftercare needs so that they can reassess your need for medications and monitor your lab values.   Increase activity slowly   Complete by: As directed       Allergies as of 06/25/2023       Reactions   Codeine Nausea And Vomiting   Hydrocodone -acetaminophen  Nausea And Vomiting        Medication List     STOP taking these medications    hydrALAZINE  50 MG  tablet Commonly known as: APRESOLINE    losartan  25 MG tablet Commonly known as: COZAAR        TAKE these medications    acetaminophen  500 MG tablet Commonly known as: TYLENOL  Take 1 tablet (500 mg total) by mouth every 6 (six) hours as needed for moderate pain. What changed:  how much to take reasons to take this   albuterol  108 (90 Base) MCG/ACT inhaler Commonly known as: VENTOLIN  HFA Inhale 2 puffs into the lungs every 6 (six) hours as needed for wheezing or shortness of breath. What changed: Another medication with the same name was changed. Make sure you understand how and when to take each.   albuterol  (2.5 MG/3ML) 0.083% nebulizer solution Commonly known as: PROVENTIL  Take 3 mLs (2.5 mg total) by nebulization every 4 (four) hours as needed for  wheezing or shortness of breath. What changed: when to take this   amLODipine  10 MG tablet Commonly known as: NORVASC  TAKE 1 TABLET BY MOUTH EVERY DAY What changed:  when to take this additional instructions   aspirin  EC 81 MG tablet Take 81 mg by mouth daily in the afternoon.   doxazosin  4 MG tablet Commonly known as: CARDURA  Take 1 tablet (4 mg total) by mouth at bedtime.   DULoxetine  60 MG capsule Commonly known as: CYMBALTA  Take 60 mg by mouth in the morning.   fluticasone  furoate-vilanterol 200-25 MCG/ACT Aepb Commonly known as: Breo Ellipta  Inhale 1 puff into the lungs in the morning and at bedtime.   guaiFENesin  600 MG 12 hr tablet Commonly known as: MUCINEX  Take 1,200 mg by mouth 2 (two) times daily as needed for cough or to loosen phlegm.   Incruse Ellipta  62.5 MCG/ACT Aepb Generic drug: umeclidinium bromide  Inhale 1 puff into the lungs daily.   ipratropium-albuterol  0.5-2.5 (3) MG/3ML Soln Commonly known as: DUONEB Take 3 mLs by nebulization every 6 (six) hours. Please take every 6 hours scheduled for next 5 days then as needed.   isosorbide  mononitrate 60 MG 24 hr tablet Commonly known as: IMDUR  Take 60 mg by mouth daily.   metoprolol  tartrate 50 MG tablet Commonly known as: LOPRESSOR  Take 1 tablet (50 mg total) by mouth 2 (two) times daily. What changed:  when to take this additional instructions   multivitamin Tabs tablet Take 1 tablet by mouth daily. What changed:  when to take this additional instructions   pantoprazole  40 MG tablet Commonly known as: Protonix  Take 1 tablet (40 mg total) by mouth daily.   pravastatin  40 MG tablet Commonly known as: PRAVACHOL  TAKE 1 TABLET BY MOUTH EVERY DAY IN THE EVENING What changed:  how much to take how to take this when to take this additional instructions   predniSONE  10 MG (21) Tbpk tablet Commonly known as: STERAPRED UNI-PAK 21 TAB Use per package instruction   sevelamer  carbonate 2.4 g  Pack Commonly known as: RENVELA  Take 4.8 g by mouth 3 (three) times daily with meals.   traMADol  50 MG tablet Commonly known as: ULTRAM  Take 50 mg by mouth daily as needed for severe pain (pain score 7-10).   traZODone  50 MG tablet Commonly known as: DESYREL  TAKE 0.5-1 TABLET (25-50 MG TOTAL) BY MOUTH AT BEDTIME AS NEEDED. FOR SLEEP What changed: See the new instructions.   UNABLE TO FIND Inhale 1 Device into the lungs at bedtime. CPAP   Velphoro  500 MG chewable tablet Generic drug: sucroferric oxyhydroxide Chew 1,000-1,500 mg by mouth See admin instructions. Chew and swallow 3 tablets (1500mg ) by  mouth three times daily with meals and take 2 tablets (1000mg ) with snacks.   Vitamin D3 25 MCG (1000 UT) Caps Take 4,000 Units by mouth daily.               Durable Medical Equipment  (From admission, onward)           Start     Ordered   06/25/23 1034  For home use only DME Nebulizer machine  Once       Question Answer Comment  Patient needs a nebulizer to treat with the following condition Asthma   Length of Need 6 Months   Additional equipment included Administration kit      06/25/23 1033   06/24/23 1618  For home use only DME oxygen   Once       Question Answer Comment  Length of Need 6 Months   Mode or (Route) Nasal cannula   Liters per Minute 2   Frequency Continuous (stationary and portable oxygen  unit needed)   Oxygen  conserving device Yes   Oxygen  delivery system Gas      06/24/23 1617            Follow-up Information      Outpatient Rehabilitation at [facility] Follow up.   Specialty: Rehabilitation Why: Please call and schedule your outpatient physical therapy Contact information: [address] [place]  [phone]               Allergies  Allergen Reactions   Codeine Nausea And Vomiting   Hydrocodone -Acetaminophen  Nausea And Vomiting     Consultations: Nephrology Gastroenterology  Procedures/Studies: [facility] Chest Port 1 View Result Date: 06/19/2023 CLINICAL DATA:  Short of breath. EXAM: PORTABLE CHEST 1 VIEW COMPARISON:  03/21/2023 and older exams. FINDINGS: Mild enlargement of the cardiac silhouette, stable. No mediastinal or hilar masses. Vascular congestion with bilateral prominent interstitial markings similar to the prior exam. No lung consolidation. No convincing pleural effusion and no pneumothorax. Stable changes from a prior cervical spine fusion. Skeletal structures are grossly intact. IMPRESSION: 1. Cardiomegaly with vascular congestion and interstitial prominence similar to the prior chest radiograph. Consider mild chronic congestive heart failure. No evidence of pneumonia. Electronically Signed   By: Amanda Jungling M.D.   On: 06/19/2023 17:42      Subjective:  Patient reports he is feeling better today, but he does have dyspnea at baseline with activity Discharge Exam: Vitals:   06/25/23 1159 06/25/23 1432  BP: (!) 116/51   Pulse: 64   Resp: 18   Temp: 97.6 F (36.4 C)   SpO2: 95% 96%   Vitals:   06/25/23 0400 06/25/23 0823 06/25/23 1159 06/25/23 1432  BP:   (!) 116/51   Pulse:   64   Resp:   18   Temp: 97.6 F (36.4 C)  97.6 F (36.4 C)   TempSrc: Axillary  Axillary   SpO2: 92% 94% 95% 96%  Weight:      Height:        General: Pt is alert, awake, not in acute distress, frail and deconditioned Cardiovascular: RRR, S1/S2 +, no rubs, no gallops Respiratory: CTA bilaterally, no wheezing, no rhonchi Abdominal: Soft, NT, ND, bowel sounds + Extremities: no edema, no cyanosis    The results of significant diagnostics from this hospitalization (including imaging, microbiology, ancillary and laboratory) are listed below for reference.     Microbiology: Recent Results (from the past 240 hours)  Resp panel by RT-PCR (RSV, Flu A&B,  Covid) Anterior Nasal Swab     Status: None   Collection Time:  06/19/23  4:06 PM   Specimen: Anterior Nasal Swab  Result Value Ref Range Status   SARS Coronavirus 2 by RT PCR NEGATIVE NEGATIVE Final   Influenza A by PCR NEGATIVE NEGATIVE Final   Influenza B by PCR NEGATIVE NEGATIVE Final    Comment: (NOTE) The Xpert Xpress SARS-CoV-2/FLU/RSV plus assay is intended as an aid in the diagnosis of influenza from Nasopharyngeal swab specimens and should not be used as a sole basis for treatment. Nasal washings and aspirates are unacceptable for Xpert Xpress SARS-CoV-2/FLU/RSV testing.  Fact Sheet for Patients: BloggerCourse.com  Fact Sheet for Healthcare Providers: SeriousBroker.it  This test is not yet approved or cleared by the [place]  FDA and has been authorized for detection and/or diagnosis of SARS-CoV-2 by FDA under an Emergency Use Authorization (EUA). This EUA will remain in effect (meaning this test can be used) for the duration of the COVID-19 declaration under Section 564(b)(1) of the Act, 21 U.S.C. section 360bbb-3(b)(1), unless the authorization is terminated or revoked.     Resp Syncytial Virus by PCR NEGATIVE NEGATIVE Final    Comment: (NOTE) Fact Sheet for Patients: BloggerCourse.com  Fact Sheet for Healthcare Providers: SeriousBroker.it  This test is not yet approved or cleared by the [place]  FDA and has been authorized for detection and/or diagnosis of SARS-CoV-2 by FDA under an Emergency Use Authorization (EUA). This EUA will remain in effect (meaning this test can be used) for the duration of the COVID-19 declaration under Section 564(b)(1) of the Act, 21 U.S.C. section 360bbb-3(b)(1), unless the authorization is terminated or revoked.  Performed at [facility], [address] [place], [place] [address]   Respiratory (~20 pathogens) panel by PCR     Status: Abnormal   Collection Time: 06/19/23  4:06  PM   Specimen: Nasopharyngeal Swab; Respiratory  Result Value Ref Range Status   Adenovirus NOT DETECTED NOT DETECTED Final   Coronavirus 229E NOT DETECTED NOT DETECTED Final    Comment: (NOTE) The Coronavirus on the Respiratory Panel, DOES NOT test for the novel  Coronavirus (2019 nCoV)    Coronavirus HKU1 NOT DETECTED NOT DETECTED Final   Coronavirus NL63 NOT DETECTED NOT DETECTED Final   Coronavirus OC43 NOT DETECTED NOT DETECTED Final   Metapneumovirus NOT DETECTED NOT DETECTED Final   Rhinovirus / Enterovirus DETECTED (A) NOT DETECTED Final   Influenza A NOT DETECTED NOT DETECTED Final   Influenza B NOT DETECTED NOT DETECTED Final   Parainfluenza Virus 1 NOT DETECTED NOT DETECTED Final   Parainfluenza Virus 2 NOT DETECTED NOT DETECTED Final   Parainfluenza Virus 3 NOT DETECTED NOT DETECTED Final   Parainfluenza Virus 4 NOT DETECTED NOT DETECTED Final   Respiratory Syncytial Virus NOT DETECTED NOT DETECTED Final   Bordetella pertussis NOT DETECTED NOT DETECTED Final   Bordetella Parapertussis NOT DETECTED NOT DETECTED Final   Chlamydophila pneumoniae NOT DETECTED NOT DETECTED Final   Mycoplasma pneumoniae NOT DETECTED NOT DETECTED Final    Comment: Performed at [facility], [address], [place], [place] [address]     Labs: BNP (last 3 results) Recent Labs    01/08/23 1923 03/16/23 1152 06/19/23 1606  BNP 1,590.5* 1,610.6* 1,727.0*   Basic Metabolic Panel: Recent Labs  Lab 06/19/23 1606 06/20/23 0426 06/20/23 1742 06/21/23 0833 06/22/23 0208 06/24/23 1914  NA 136 136  --  133* 134* 135  K 2.8* 4.0  --  4.3 4.2 4.2  CL 94* 95*  --  91* 91* 95*  CO2 31 26  --  25 27 25   GLUCOSE 110* 148*  --  154* 148* 207*  BUN 10 19  --  50* 41* 54*  CREATININE 4.05* 5.53*  --  8.78* 7.11* 7.85*  CALCIUM  8.1* 8.7*  --  9.0 9.6 9.3  PHOS  --   --  5.0* 5.6*  --  4.2   Liver Function Tests: Recent Labs  Lab 06/20/23 1742 06/21/23 0833 06/24/23 1914  ALBUMIN  3.5  3.5 3.4*   No results for input(s): "LIPASE", "AMYLASE" in the last 168 hours. No results for input(s): "AMMONIA" in the last 168 hours. CBC: Recent Labs  Lab 06/19/23 1606 06/20/23 0426 06/21/23 0833 06/22/23 0208 06/24/23 1914  WBC 2.3* 2.1* 3.3* 5.2 7.5  NEUTROABS 1.4*  --   --   --   --   HGB 6.8* 8.5* 8.2* 9.7* 9.9*  HCT 21.4* 26.5* 25.1* 30.0* 29.5*  MCV 100.5* 98.9 99.2 99.3 98.3  PLT 74* 78* 73* 115* 132*   Cardiac Enzymes: No results for input(s): "CKTOTAL", "CKMB", "CKMBINDEX", "TROPONINI" in the last 168 hours. BNP: Invalid input(s): "POCBNP" CBG: Recent Labs  Lab 06/20/23 0823 06/21/23 0356  GLUCAP 150* 152*   D-Dimer No results for input(s): "DDIMER" in the last 72 hours. Hgb A1c No results for input(s): "HGBA1C" in the last 72 hours. Lipid Profile No results for input(s): "CHOL", "HDL", "LDLCALC", "TRIG", "CHOLHDL", "LDLDIRECT" in the last 72 hours. Thyroid  function studies No results for input(s): "TSH", "T4TOTAL", "T3FREE", "THYROIDAB" in the last 72 hours.  Invalid input(s): "FREET3" Anemia work up No results for input(s): "VITAMINB12", "FOLATE", "FERRITIN", "TIBC", "IRON", "RETICCTPCT" in the last 72 hours. Urinalysis    Component Value Date/Time   COLORURINE YELLOW 02/19/2017 1520   APPEARANCEUR HAZY (A) 02/19/2017 1520   LABSPEC 1.012 02/19/2017 1520   PHURINE 8.0 02/19/2017 1520   GLUCOSEU 50 (A) 02/19/2017 1520   HGBUR MODERATE (A) 02/19/2017 1520   BILIRUBINUR NEGATIVE 02/19/2017 1520   KETONESUR NEGATIVE 02/19/2017 1520   PROTEINUR >=300 (A) 02/19/2017 1520   UROBILINOGEN 0.2 04/06/2014 1030   NITRITE NEGATIVE 02/19/2017 1520   LEUKOCYTESUR MODERATE (A) 02/19/2017 1520   Sepsis Labs Recent Labs  Lab 06/20/23 0426 06/21/23 0833 06/22/23 0208 06/24/23 1914  WBC 2.1* 3.3* 5.2 7.5   Microbiology Recent Results (from the past 240 hours)  Resp panel by RT-PCR (RSV, Flu A&B, Covid) Anterior Nasal Swab     Status: None   Collection  Time: 06/19/23  4:06 PM   Specimen: Anterior Nasal Swab  Result Value Ref Range Status   SARS Coronavirus 2 by RT PCR NEGATIVE NEGATIVE Final   Influenza A by PCR NEGATIVE NEGATIVE Final   Influenza B by PCR NEGATIVE NEGATIVE Final    Comment: (NOTE) The Xpert Xpress SARS-CoV-2/FLU/RSV plus assay is intended as an aid in the diagnosis of influenza from Nasopharyngeal swab specimens and should not be used as a sole basis for treatment. Nasal washings and aspirates are unacceptable for Xpert Xpress SARS-CoV-2/FLU/RSV testing.  Fact Sheet for Patients: BloggerCourse.com  Fact Sheet for Healthcare Providers: SeriousBroker.it  This test is not yet approved or cleared by the [place]  FDA and has been authorized for detection and/or diagnosis of SARS-CoV-2 by FDA under an Emergency Use Authorization (EUA). This EUA will remain in effect (meaning this test can be used) for the duration of the COVID-19 declaration under Section 564(b)(1) of the  Act, 21 U.S.C. section 360bbb-3(b)(1), unless the authorization is terminated or revoked.     Resp Syncytial Virus by PCR NEGATIVE NEGATIVE Final    Comment: (NOTE) Fact Sheet for Patients: BloggerCourse.com  Fact Sheet for Healthcare Providers: SeriousBroker.it  This test is not yet approved or cleared by the [place]  FDA and has been authorized for detection and/or diagnosis of SARS-CoV-2 by FDA under an Emergency Use Authorization (EUA). This EUA will remain in effect (meaning this test can be used) for the duration of the COVID-19 declaration under Section 564(b)(1) of the Act, 21 U.S.C. section 360bbb-3(b)(1), unless the authorization is terminated or revoked.  Performed at [facility], [address], [place], [place] [address]   Respiratory (~20 pathogens) panel by PCR     Status: Abnormal   Collection Time: 06/19/23   4:06 PM   Specimen: Nasopharyngeal Swab; Respiratory  Result Value Ref Range Status   Adenovirus NOT DETECTED NOT DETECTED Final   Coronavirus 229E NOT DETECTED NOT DETECTED Final    Comment: (NOTE) The Coronavirus on the Respiratory Panel, DOES NOT test for the novel  Coronavirus (2019 nCoV)    Coronavirus HKU1 NOT DETECTED NOT DETECTED Final   Coronavirus NL63 NOT DETECTED NOT DETECTED Final   Coronavirus OC43 NOT DETECTED NOT DETECTED Final   Metapneumovirus NOT DETECTED NOT DETECTED Final   Rhinovirus / Enterovirus DETECTED (A) NOT DETECTED Final   Influenza A NOT DETECTED NOT DETECTED Final   Influenza B NOT DETECTED NOT DETECTED Final   Parainfluenza Virus 1 NOT DETECTED NOT DETECTED Final   Parainfluenza Virus 2 NOT DETECTED NOT DETECTED Final   Parainfluenza Virus 3 NOT DETECTED NOT DETECTED Final   Parainfluenza Virus 4 NOT DETECTED NOT DETECTED Final   Respiratory Syncytial Virus NOT DETECTED NOT DETECTED Final   Bordetella pertussis NOT DETECTED NOT DETECTED Final   Bordetella Parapertussis NOT DETECTED NOT DETECTED Final   Chlamydophila pneumoniae NOT DETECTED NOT DETECTED Final   Mycoplasma pneumoniae NOT DETECTED NOT DETECTED Final    Comment: Performed at [facility], [address] [place], [place] [address]     Time coordinating discharge: Over 30 minutes  SIGNED:   Seena Dadds, MD  [facility] 06/25/2023, 2:44 PM Pager   If 7PM-7AM, please contact night-coverage [email or web address] Password [email or web address]

## 2023-06-25 NOTE — TOC Benefit Eligibility Note (Signed)
 Pharmacy Patient Advocate Encounter   Received notification from Inpatient Request that prior authorization for Albuterol  0.083% solution is required/requested.   Insurance verification completed.   The patient is insured through [facility] .   Per test claim: PA required; PA submitted to above mentioned insurance via CoverMyMeds Key/confirmation #/EOC [id number] Status is pending

## 2023-06-25 NOTE — Discharge Instructions (Signed)
 Follow with Primary MD Ngetich, Dinah C, NP in 7 days   Get CBC, CMP, 2 view Chest X ray checked  by Primary MD next visit.    Activity: As tolerated with Full fall precautions use walker/cane & assistance as needed   Disposition Home    Diet: renal diet with fluid restriction 1200cc/day   On your next visit with your primary care physician please Get Medicines reviewed and adjusted.   Please request your Prim.MD to go over all Hospital Tests and Procedure/Radiological results at the follow up, please get all Hospital records sent to your Prim MD by signing hospital release before you go home.   If you experience worsening of your admission symptoms, develop shortness of breath, life threatening emergency, suicidal or homicidal thoughts you must seek medical attention immediately by calling 911 or calling your MD immediately  if symptoms less severe.  You Must read complete instructions/literature along with all the possible adverse reactions/side effects for all the Medicines you take and that have been prescribed to you. Take any new Medicines after you have completely understood and accpet all the possible adverse reactions/side effects.   Do not drive, operating heavy machinery, perform activities at heights, swimming or participation in water  activities or provide baby sitting services if your were admitted for syncope or siezures until you have seen by Primary MD or a Neurologist and advised to do so again.  Do not drive when taking Pain medications.    Do not take more than prescribed Pain, Sleep and Anxiety Medications  Special Instructions: If you have smoked or chewed Tobacco  in the last 2 yrs please stop smoking, stop any regular Alcohol  and or any Recreational drug use.  Wear Seat belts while driving.   Please note  You were cared for by a hospitalist during your hospital stay. If you have any questions about your discharge medications or the care you received  while you were in the hospital after you are discharged, you can call the unit and asked to speak with the hospitalist on call if the hospitalist that took care of you is not available. Once you are discharged, your primary care physician will handle any further medical issues. Please note that NO REFILLS for any discharge medications will be authorized once you are discharged, as it is imperative that you return to your primary care physician (or establish a relationship with a primary care physician if you do not have one) for your aftercare needs so that they can reassess your need for medications and monitor your lab values.

## 2023-06-25 NOTE — TOC Transition Note (Addendum)
 Transition of Care [facility]) - Discharge Note   Patient Details  Name: Kirk [name] MRN: [id number] Date of Birth: 17-Mar-1960  Transition of [facility]) CM/SW Contact:  Eusebio High, RN Phone Number: 06/25/2023, 11:10 AM   Clinical Narrative:    Update:  O2 to be delivered by [employer] PRIOR to DC    Patient will DC to home today. Patient declined recommended HH so an OP referral for Physical Therapy has been made to the [facility] location. AVS updated  Family will transport     Patient states he has RW and Rollator- PT has recommended rollator use for patient          Patient Goals and CMS Choice            Discharge Placement                       Discharge Plan and Services Additional resources added to the After Visit Summary for                                       Social Drivers of Health (SDOH) Interventions SDOH Screenings   Food Insecurity: No Food Insecurity (06/20/2023)  Housing: Low Risk  (06/20/2023)  Transportation Needs: No Transportation Needs (06/20/2023)  Utilities: Not At Risk (06/20/2023)  Alcohol Screen: Low Risk  (02/03/2018)  Depression (PHQ2-9): Low Risk  (01/29/2023)  Financial Resource Strain: Medium Risk (11/02/2021)  Physical Activity: Insufficiently Active (06/25/2017)  Social Connections: Patient Declined (03/17/2023)  Stress: No Stress Concern Present (06/25/2017)  Tobacco Use: Medium Risk (06/19/2023)     Readmission Risk Interventions    03/18/2023    5:03 PM 01/11/2023    5:26 PM  Readmission Risk Prevention Plan  Transportation Screening Complete Complete  Medication Review (RN Care Manager) Complete Complete  PCP or Specialist appointment within 3-5 days of discharge Complete Complete  HRI or Home Care Consult Complete Complete  Palliative Care Screening Not Applicable Not Applicable  Skilled Nursing Facility Not Applicable Not Applicable

## 2023-06-25 NOTE — Progress Notes (Signed)
 SATURATION QUALIFICATIONS: (This note is used to comply with regulatory documentation for home oxygen )  Patient Saturations on Room Air at Rest = 90%  Patient Saturations on Room Air while Ambulating = 85%  Patient Saturations on 2 Liters of oxygen  while Ambulating = 93%  Please briefly explain why patient needs home oxygen :  With ambulation, 02 dropped to 85%, pt visibly SOB and had to take breaks and rest in the hallway.  Oxygen  improved when placed on 2L [place].

## 2023-06-25 NOTE — Discharge Planning (Signed)
 [facility] Patient Discharge Orders- [facility] CLINIC: [facility]  Patient's name: Kirk [name] Admit/DC Dates: 06/19/2023 - 06/25/2023  Discharge Diagnoses: Acute hypoxic respiratory failure   Acute rhinovirus infection Symptomatic anemia  Aranesp : Given: no   Date and amount of last dose: N/A  Last Hgb: 9.9 PRBC's Given: Yes Date/# of units: 1 unit on 06/19/23 ESA dose for discharge: same dose IV Iron dose at discharge: same dose  Heparin  change: no  EDW Change: no New EDW:   [place] Change: no  Access intervention/Change: no Details:  Hectorol /Calcitriol  change: no  Discharge Labs: Calcium  9.3 Phosphorus 4.2 Albumin  3.4 K+ 4.2  IV Antibiotics: no Details:  On Coumadin?: no Last INR: Next INR: Managed By:   OTHER/APPTS/LAB ORDERS:    D/C Meds to be reconciled by nurse after every discharge.  Completed By: Ramona Burner, PA-C 06/25/2023, 8:32 PM  [facility] Pager: [phone]    Reviewed by: MD:______ RN_______

## 2023-06-25 NOTE — Progress Notes (Signed)
 [facility] Progress Note  Subjective:  Seen in room 2 L off w/ HD yest, no BP drops  BP's a bit soft this am, down from yest  Vitals:   06/25/23 0400 06/25/23 0823 06/25/23 1159 06/25/23 1432  BP:   (!) 116/51   Pulse:   64   Resp:   18   Temp: 97.6 F (36.4 C)  97.6 F (36.4 C)   TempSrc: Axillary  Axillary   SpO2: 92% 94% 95% 96%  Weight:      Height:        Exam: Gen alert, no distress, 3 L Brady O2 No jvd or bruits Chest wheezing mostly resolved, no sig rales RRR no MRG Abd soft ntnd no mass or ascites +bs Ext no sig LE edema, no other edema Neuro is alert, Ox 3 , nf    LUA AVF+bruit      Renal-related home meds: Renvela  6 ac tid Losartan  25 every day Norvasc  10 every day Doxazosin  4 mg hs Hydralazine  50 tid Lopressor  50 bid Renavite every day Velphoro  3 ac tid       OP HD: MWF G-O 4h   B400   82.7kg  L AVF   Heparin  2500 Last OP HD 5/28, post wt 84.3kg Usually gets to dry wt, but wt gains are typically 3-4 kg   CXR 5/28 - vasc congestion and mild IS edema      Assessment/ Plan: Acute hypoxic resp failure: +vasc congestion + IS edema on initial CXR.  Suspected volume overload + asthma + rhinovirus. Cont to lower vol as tolerated.   Acute rhinovirus infection: per pmd Asthma: nebs, IV steroids per pmd  ESRD: HD MWF. Has HD yesterday.  Volume: weighed standing today at 84.1kg, up 1.5 from dry wt. Euvolemic on exam. Keep dry wt.  Acute on chronic anemia: w/ dark stools x 1 week. Hb improved sp prbc's up to 8-10 range now.   HTN: bp's wnl, on 3 bp medications  per pmd. Stable.  Secondary hyperparathyroidism: Ca in range, cont binders  Dispo:  possible dc today  Rob Zana Hesselbach MD  [facility] 06/25/2023, 2:46 PM  Recent Labs  Lab 06/21/23 0833 06/22/23 0208 06/24/23 1914  HGB 8.2* 9.7* 9.9*  ALBUMIN  3.5  --  3.4*  CALCIUM  9.0 9.6 9.3  PHOS 5.6*  --  4.2  CREATININE 8.78* 7.11* 7.85*  K 4.3 4.2 4.2   Recent Labs  Lab 06/22/23 0208   IRON 102  TIBC 265  FERRITIN 1,481*   Inpatient medications:  amLODipine   10 mg Oral Daily   Chlorhexidine  Gluconate Cloth  6 each Topical Q0600   Chlorhexidine  Gluconate Cloth  6 each Topical Q0600   doxazosin   4 mg Oral QHS   DULoxetine   60 mg Oral q AM   ipratropium-albuterol   3 mL Nebulization Q6H   isosorbide  mononitrate  60 mg Oral q AM   methylPREDNISolone  (SOLU-MEDROL ) injection  80 mg Intravenous Daily   metoprolol  tartrate  50 mg Oral BID   pantoprazole   40 mg Oral BID   sevelamer  carbonate  4.8 g Oral TID WC   sodium chloride  flush  3 mL Intravenous Q12H      albuterol , diphenhydrAMINE , guaiFENesin , sodium chloride  flush

## 2023-06-25 NOTE — Progress Notes (Signed)
 Occupational Therapy Treatment Patient Details Name: Kirk [name] MRN: [id number] DOB: [birth year] Today's Date: 06/25/2023   History of present illness 63 y.o. male who presents 5/28 with shortness of breath. Admitted with Acute hypoxic respiratory failure,  Acute COPD exacerbation,  Symptomatic anemia,  Acute on chronic systolic CHF,  Rhinovirus infection with asthma exacerbation. PMH: hfref, copd, esrd on mwf dialysis, htn, osa on cpap, a fib not anticoagulated.   OT comments  Patient in bathroom upon entry and exited without RW or O2. O2 checked with SpO2 85% and oxygen  reapplied on 3 liters. Patient instructed on pursed lip breathing and SpO2 increased to 93-95%. Patient performed sock change and grooming tasks with supervision and no RW in room. Patient performed mobility in hallway on 3 liters and complained of dizziness and returned to room with BP at 102/56 (71). Patient performed static standing with BP 95/48 (64) and seated 114/53 (71). Handout provided to patient on energy conservation and reviewed with patient. Discharge recommendations changed to [facility] due to progress. Acute OT to continue to follow to address established goals.       If plan is discharge home, recommend the following:  A lot of help with walking and/or transfers;A lot of help with bathing/dressing/bathroom   Equipment Recommendations  Other (comment) (defer to next venue of care)    Recommendations for Other Services      Precautions / Restrictions Precautions Precautions: Fall Recall of Precautions/Restrictions: Intact Precaution/Restrictions Comments: monitor O2 Restrictions Weight Bearing Restrictions Per Provider Order: No       Mobility Bed Mobility Overal bed mobility: Needs Assistance             General bed mobility comments: OOB in recliner    Transfers Overall transfer level: Needs assistance Equipment used: Rolling walker (2 wheels), None Transfers: Sit to/from Stand Sit to Stand:  Supervision           General transfer comment: patient in bathroom upon entry and exited without RW, required RW for longer distances with decreased activity tolerance     Balance Overall balance assessment: Needs assistance Sitting-balance support: No upper extremity supported, Feet supported Sitting balance-Leahy Scale: Good     Standing balance support: No upper extremity supported, During functional activity Standing balance-Leahy Scale: Fair Standing balance comment: able to stand without UE support                           ADL either performed or assessed with clinical judgement   ADL Overall ADL's : Needs assistance/impaired     Grooming: Wash/dry hands;Wash/dry face;Supervision/safety;Standing               Lower Body Dressing: Supervision/safety;Sitting/lateral leans Lower Body Dressing Details (indicate cue type and reason): to change socks Toilet Transfer: Supervision/safety;Ambulation Toilet Transfer Details (indicate cue type and reason): Patient in bathroom upon entry and exited without RW and no O2. SpO2 85% without O2 Toileting- Clothing Manipulation and Hygiene: Modified independent;Sitting/lateral lean       Functional mobility during ADLs: Contact guard assist;Rolling walker (2 wheels) (for longer distances) General ADL Comments: able to ambulate in room without AD but required RW for longer distances    Extremity/Trunk Assessment              Vision       Perception     Praxis     Communication Communication Communication: Impaired Factors Affecting Communication: Hearing impaired   Cognition Arousal: Alert Behavior  During Therapy: WFL for tasks assessed/performed Cognition: No apparent impairments                               Following commands: Intact        Cueing   Cueing Techniques: Verbal cues  Exercises      Shoulder Instructions       General Comments Patient on RA from bathroom with  SpO2 85%. 3 liters O2 provided with cues for PLB and SpO2 increased to 93-95%. Patient performed mobility in hallway and stated dizziness and returned to room. BP once in room 102/56 (71). Patient performed static standing with BP 95/48 (64) and sitting 114/53 (71)    Pertinent Vitals/ Pain       Pain Assessment Pain Assessment: No/denies pain  Home Living                                          Prior Functioning/Environment              Frequency  Min 2X/week        Progress Toward Goals  OT Goals(current goals can now be found in the care plan section)  Progress towards OT goals: Progressing toward goals  Acute Rehab OT Goals Patient Stated Goal: to go home OT Goal Formulation: With patient Time For Goal Achievement: 07/07/23 Potential to Achieve Goals: Good ADL Goals Pt Will Perform Grooming: standing;with supervision Pt Will Transfer to Toilet: ambulating;with supervision Pt/caregiver will Perform Home Exercise Program: Increased strength;Both right and left upper extremity;With written HEP provided;With theraband Additional ADL Goal #1: Pt will complete standing ADL task >5 minutes with supervision to improve activity tolerance  Plan      Co-evaluation                 AM-PAC OT "6 Clicks" Daily Activity     Outcome Measure   Help from another person eating meals?: None Help from another person taking care of personal grooming?: A Little Help from another person toileting, which includes using toliet, bedpan, or urinal?: A Little Help from another person bathing (including washing, rinsing, drying)?: A Little Help from another person to put on and taking off regular upper body clothing?: A Little Help from another person to put on and taking off regular lower body clothing?: A Little 6 Click Score: 19    End of Session Equipment Utilized During Treatment: Gait belt;Rolling walker (2 wheels)  OT Visit Diagnosis: Other abnormalities  of gait and mobility (R26.89);Unsteadiness on feet (R26.81);Muscle weakness (generalized) (M62.81)   Activity Tolerance Patient tolerated treatment well;Other (comment) (dizziness at end of session)   Patient Left in chair;with call bell/phone within reach   Nurse Communication Mobility status (soft BP)        Time: 0981-1914 OT Time Calculation (min): 24 min  Charges: OT General Charges $OT Visit: 1 Visit OT Treatments $Self Care/Home Management : 8-22 mins $Therapeutic Activity: 8-22 mins  Anitra Barn, OTA Acute Rehabilitation Services  Office [phone]   Jovita Nipper 06/25/2023, 12:53 PM

## 2023-06-26 ENCOUNTER — Telehealth: Payer: Self-pay

## 2023-06-26 ENCOUNTER — Telehealth (HOSPITAL_COMMUNITY): Payer: Self-pay | Admitting: Pharmacy Technician

## 2023-06-26 DIAGNOSIS — Z992 Dependence on renal dialysis: Secondary | ICD-10-CM | POA: Diagnosis not present

## 2023-06-26 DIAGNOSIS — D509 Iron deficiency anemia, unspecified: Secondary | ICD-10-CM | POA: Diagnosis not present

## 2023-06-26 DIAGNOSIS — N2581 Secondary hyperparathyroidism of renal origin: Secondary | ICD-10-CM | POA: Diagnosis not present

## 2023-06-26 DIAGNOSIS — J9621 Acute and chronic respiratory failure with hypoxia: Secondary | ICD-10-CM | POA: Diagnosis not present

## 2023-06-26 DIAGNOSIS — N186 End stage renal disease: Secondary | ICD-10-CM | POA: Diagnosis not present

## 2023-06-26 DIAGNOSIS — T8612 Kidney transplant failure: Secondary | ICD-10-CM | POA: Diagnosis not present

## 2023-06-26 DIAGNOSIS — D631 Anemia in chronic kidney disease: Secondary | ICD-10-CM | POA: Diagnosis not present

## 2023-06-26 NOTE — Telephone Encounter (Signed)
 Pharmacy Patient Advocate Encounter  Received notification from [facility] that Prior Authorization for Albuterol  Sulfate (2.5 MG/3ML)0.083% nebulizer solution has been DENIED.  Full denial letter will be uploaded to the media tab. See denial reason below. ALBUTEROL  SULFATE Nebu Soln is used in a nebulizer. A nebulizer is a piece of durable medical equipment (DME). Drugs used with DME in the home are covered under Medicare Part B. Our records show that you do not live in a long term care (LTC) facility. We cannot pay for drugs under Medicare Part D if they are covered under Medicare Part A or B. We did not decide whether ALBUTEROL  SULFATE Nebu Soln is medically necessary. We made our decision only on the fact that we cannot pay for the drug under Medicare Part D  PA #/Case ID/Reference #: [id number]

## 2023-06-26 NOTE — Transitions of Care (Post Inpatient/ED Visit) (Signed)
   06/26/2023  Name: Kirk [name] MRN: [id number] DOB: December 26, 1960  Today's TOC FU Call Status: Today's TOC FU Call Status:: Unsuccessful Call (1st Attempt) Unsuccessful Call (1st Attempt) Date: 06/26/23  Attempted to reach the patient regarding the most recent Inpatient/ED visit.  Follow Up Plan: Additional outreach attempts will be made to reach the patient to complete the Transitions of Care (Post Inpatient/ED visit) call.   Tonia Frankel RN, CCM [place]  VBCI-Population Health RN Care Manager [phone]

## 2023-06-27 ENCOUNTER — Telehealth: Payer: Self-pay

## 2023-06-27 NOTE — Transitions of Care (Post Inpatient/ED Visit) (Signed)
   06/27/2023  Name: Kirk [name] MRN: [id number] DOB: 1960/06/11  Today's TOC FU Call Status: Today's TOC FU Call Status:: Unsuccessful Call (2nd Attempt) Unsuccessful Call (2nd Attempt) Date: 06/27/23  Attempted to reach the patient regarding the most recent Inpatient/ED visit.  Follow Up Plan: Additional outreach attempts will be made to reach the patient to complete the Transitions of Care (Post Inpatient/ED visit) call.   Tonia Frankel RN, CCM [place]  VBCI-Population Health RN Care Manager [phone]

## 2023-06-28 ENCOUNTER — Telehealth: Payer: Self-pay

## 2023-06-28 DIAGNOSIS — N186 End stage renal disease: Secondary | ICD-10-CM | POA: Diagnosis not present

## 2023-06-28 DIAGNOSIS — D631 Anemia in chronic kidney disease: Secondary | ICD-10-CM | POA: Diagnosis not present

## 2023-06-28 DIAGNOSIS — Z992 Dependence on renal dialysis: Secondary | ICD-10-CM | POA: Diagnosis not present

## 2023-06-28 DIAGNOSIS — D509 Iron deficiency anemia, unspecified: Secondary | ICD-10-CM | POA: Diagnosis not present

## 2023-06-28 DIAGNOSIS — N2581 Secondary hyperparathyroidism of renal origin: Secondary | ICD-10-CM | POA: Diagnosis not present

## 2023-06-28 NOTE — Transitions of Care (Post Inpatient/ED Visit) (Signed)
   06/28/2023  Name: Kirk [name] MRN: [id number] DOB: July 27, 1960  Today's TOC FU Call Status: Today's TOC FU Call Status:: Unsuccessful Call (3rd Attempt) Unsuccessful Call (3rd Attempt) Date: 06/28/23  Attempted to reach the patient regarding the most recent Inpatient/ED visit.  Follow Up Plan: No further outreach attempts will be made at this time. We have been unable to contact the patient.  Tonia Frankel RN, CCM [place]  VBCI-Population Health RN Care Manager [phone]

## 2023-06-30 NOTE — Progress Notes (Unsigned)
 Subjective:   Patient ID: Kirk [name], male    DOB: 04-29-1960     MRN: [id number]    Brief patient profile:  61  yobm quit smoking 02/2020  referred 10/18/2014 to [facility] Sizer for eval of ? Copd for clearance for lap chole  with  no airflow obst on spiromtery   10/18/2014 on HD since 2012    History of Present Illness  10/18/2014 1st [facility] office visit/ [name]   Chief Complaint  Patient presents with   Advice Only    refer Dr. Marrie Sizer. Denies any wheezing, no chest tx, no cough. Pt here to be evaluated for COPD.    variable sob with exertion - says can walk fine but no regular walking,  Some sob steps   rec Plan A = Backup=  symbiocrt 80 Take 2 puffs first thing in am and then another 2 puffs about 12 hours later.  Plan B = Back up Only use your combivent  as a rescue medication  Plan C = crisis Only use your nebulizer if you try B first and doesn't work You are cleared for surgery  > tol lap chole fine    09/30/2017  f/u ov/[name] re: asthma/ smoker/ f/u [facility] pna Chief Complaint  Patient presents with   Follow-up    Pt c/o increased cough with white sputum, SOB, nasal congestion, runny nose x 2 wks.   Dyspnea:  Walked at [employer] using rollator 2 d prior to OV   Cough: white esp hs  Sleeping: not needing saba no elevate  SABA use: more need over the last 2 weeks 2-3 days/ neb x 2  02: just at hd   rec Plan A = Automatic = symbicort  increase to 160 Take 2 puffs first thing in am and then another 2 puffs about 12 hours later.  Work on Set designer B = Backup Only use your albuterol  (ventolin )  as a rescue medication Plan C = Crisis - only use your albuterol  nebulizer if you first try Plan B and it fails to help > ok to use the nebulizer up to every 4 hours but if start needing it regularly call for immediate appointment The key is to stop smoking completely before smoking completely stops you!      01/08/2019  f/u ov/[name] re: AB / symb 160  2bid  Chief Complaint  Patient presents with   Follow-up    Breathing is doing well today and no new co's.    Dyspnea:  Walking at mall x 44min-20 min avg pace x up to sev times a week Cough: none  Sleeping: able to lie in flat bed  SABA use: rarely use saba / no neb  02: no  No change breathing Sunday pm vs any other day - HD  MWF rec Plan A = Automatic = Always=    Symbicort  160 Take 2 puffs first thing in am and then another 2 puffs about 12 hours later.  Work on inhaler technique:  think  of a golfer warming up before you use the inhaler  Plan B = Backup (to supplement plan A, not to replace it) Only use your albuterol  (ventolin  and proventil ) inhaler as a rescue medication Plan C = Crisis (instead of Plan B but only if Plan B stops working) - only use your albuterol  nebulizer if you first try Plan B      08/25/2020  f/u ov/[name] re: AB  maint on symb  160 2bid  and stopped smoking  Chief Complaint  Patient presents with   Follow-up    F/U asthma    Dyspnea:  walking 20 min s stopping some hills Cough: none  Sleeping: cpap/ [facility]  SABA use: not needing  02: none Covid status:   vax x 3  Rec No change in medications although if doing great you can take symbicort  more as needed up to 2 puffs every 12 hours  We will contact you in April 2023 for the lung cancer screening program Please schedule a follow up visit in 12 months but call sooner if needed   Mar 02 2021 eval  by Sueanne Emerald NP on symbicort    Feb 13 23 neck sugery at [facility] then later w/in weeks onset of globus and stopped symbicort  cause wasn't helping     08/29/2021  f/u ov/[name] re: AB maint off cigs and maint on prn saba  Chief Complaint  Patient presents with   Follow-up    Since LOV had neck surgery in Feb. Still having SOB with exertion and still having cough with sputum.   Dyspnea:  tired > sob  Cough: white mucus esp at hs / globus sensation since nec surgery  Sleeping: lots of pillows seems to help SABA  use: once a day ? If helps  02: none  Covid status:  vax x 2  On HD M-W-F Rec Ok to leave off symbicort  as long as breathing is same and don't need more albuterol  nebulizer than usual  See me after your ENT evaluation if not satisfied  GERD diet reviewed, bed blocks rec   Follow up is as needed     Admit date: 06/19/2023 Discharge date: 06/25/2023  Home Health: He declined home health PT, but would like to have outpatient PT at [facility]. Equipment/Devices: DME nebs and oxygen    Brief/Interim Summary:   Kirk [name] is a 63 y.o. male with medical history significant for hfref, copd, esrd on mwf dialysis, htn, osa on cpap, a fib not anticoagulated.    Reports feeling ill for several days, feeling short of breath, at rest but worse with ambulation. Some cough as well, no fevers. No vomiting or diarrhea but has had a week of black stool. Is on asa, denies nsaids.  Patient presented from HD center during dialysis session due to dyspnea, increased work of breathing, requiring BiPAP on presentation, his workup significant for volume overload, asthma exacerbation, likely provoked by rhinovirus, and anemia .  Workup significant for anemia as well, but endoscopy with no acute findings, please see discussion below.     Acute hypoxic respiratory failure Asthma exacerbation Symptomatic anemia Acute on chronic systolic CHF Rhinovirus infection with asthma exacerbation -Patient presents with acute respiratory failure with hypoxia, initially requiring BiPAP, currently on oxygen , oxygen  requirement post BiPAP as well, still requiring oxygen  intermittently, ambulated in the hallway, he is requiring oxygen  which has been arranged at time of discharge  - Encouraged to take his incentive spirometer and flutter valve and keep using it at home- -currently dyspneic on presentation with wheezing, this has improved with steroids, he will be discharged on steroid taper - Volume management with  hemodialysis .  Volume status much improved after receiving dialysis 2 days back-to-back.  He is to continue his hemodialysis on regular schedule Monday Wednesday Friday as an outpatient - Please see discussion below under anemia . - Will be discharged on prednisone  taper, and as needed nebs and oxygen    Symptomatic anemia  Macrocytic anemia GI bleed  Anemia of chronic kidney disease  -He reports melena, hemoglobin 6.8 on presentation, responded well to transfusion, hemoglobin remained stable - GI input greatly appreciated, continue with PPI , went for endoscopy 5/30, with no concerning findings for GI bleed . - Anemia most likely in setting of chronic kidney disease, as B12, folic acid  and ferritin not low    ESRD on HD:  -Dialysis per nephrology.  Dialyzed initially back-to-back for volume overload   Acute on chronic systolic CHF  - EF 35% on recent echo.  Chest x-ray with vascular congestion.  Volume management per dialysis.     History of OSA: CPAP at night.   History of A-fib: Not anticoagulated per cardiology.  He will be high risk in the setting of thrombocytopenia, and possible GI bleed currently normal sinus rhythm   History of pancytopenia:  Thrombocytopenia - This is at baseline  07/03/2023   re-establish post hosp  ov/Betsabe Iglesia re: AB   maint on no rx  -really confused  Chief Complaint  Patient presents with   Follow-up    When to hospital couple a weeks ago , he was having trouble breathing,  his wife stated that she he started back on the allergy medication and mucusix for coughing, he is on oxygen  was put on 2L cont.    Dyspnea:  room to room doe and then puts on 02 p stops  Cough: congested but no mucus Sleeping: flat bed /  3 pillows 3-4 am   wakes up with cough / wheeze/ sob even on 02   SABA use: not sure what  02: 2lpm HS /  prn     No obvious day to day or daytime variability or assoc excess/ purulent sputum or mucus plugs or hemoptysis or cp or chest tightness,  subjective wheeze or overt sinus or hb symptoms.    Also denies any obvious fluctuation of symptoms with weather or environmental changes or other aggravating or alleviating factors except as outlined above   No unusual exposure hx or h/o childhood pna/ asthma or knowledge of premature birth.  Current Allergies, Complete Past Medical History, Past Surgical History, Family History, and Social History were reviewed in [employer] record.  ROS  The following are not active complaints unless bolded Hoarseness, sore throat, dysphagia, dental problems, itching, sneezing,  nasal congestion or discharge of excess mucus or purulent secretions, ear ache,   fever, chills, sweats, unintended wt loss or wt gain, classically pleuritic or exertional cp,  orthopnea pnd or arm/hand swelling  or leg swelling, presyncope, palpitations, abdominal pain, anorexia, nausea, vomiting, diarrhea  or change in bowel habits or change in bladder habits, change in stools or change in urine, dysuria, hematuria,  rash, arthralgias, visual complaints, headache, numbness, weakness or ataxia or problems with walking or coordination,  change in mood or  memory.        Current Meds  Medication Sig   acetaminophen  (TYLENOL ) 500 MG tablet Take 1 tablet (500 mg total) by mouth every 6 (six) hours as needed for moderate pain. (Patient taking differently: Take 1,000 mg by mouth every 6 (six) hours as needed for moderate pain (pain score 4-6) or headache.)   albuterol  (PROVENTIL ) (2.5 MG/3ML) 0.083% nebulizer solution Take 3 mLs (2.5 mg total) by nebulization every 4 (four) hours as needed for wheezing or shortness of breath.   albuterol  (VENTOLIN  HFA) 108 (90 Base) MCG/ACT inhaler Inhale 2 puffs into the lungs every 6 (six)  hours as needed for wheezing or shortness of breath.   amLODipine  (NORVASC ) 10 MG tablet TAKE 1 TABLET BY MOUTH EVERY DAY (Patient taking differently: Take 10 mg by mouth See admin instructions.  Take 1 tablet (10mg ) by mouth once daily in the afternoon.)   aspirin  EC 81 MG tablet Take 81 mg by mouth daily in the afternoon.   budesonide -formoterol  (SYMBICORT ) 160-4.5 MCG/ACT inhaler Take 2 puffs first thing in am and then another 2 puffs about 12 hours later.   Cholecalciferol  (VITAMIN D3) 25 MCG (1000 UT) CAPS Take 4,000 Units by mouth daily.   doxazosin  (CARDURA ) 4 MG tablet Take 1 tablet (4 mg total) by mouth at bedtime.   DULoxetine  (CYMBALTA ) 60 MG capsule Take 60 mg by mouth in the morning.   guaiFENesin  (MUCINEX ) 600 MG 12 hr tablet Take 1,200 mg by mouth 2 (two) times daily as needed for cough or to loosen phlegm.   ipratropium-albuterol  (DUONEB) 0.5-2.5 (3) MG/3ML SOLN Take 3 mLs by nebulization every 6 (six) hours. Please take every 6 hours scheduled for next 5 days then as needed.   isosorbide  mononitrate (IMDUR ) 60 MG 24 hr tablet Take 60 mg by mouth daily.   metoprolol  tartrate (LOPRESSOR ) 50 MG tablet Take 1 tablet (50 mg total) by mouth 2 (two) times daily. (Patient taking differently: Take 50 mg by mouth See admin instructions. Take 1 tablet (50mg ) by mouth twice daily, in the afternoon and at bedtime.)   multivitamin (RENA-VIT) TABS tablet Take 1 tablet by mouth daily. (Patient taking differently: Take 1 tablet by mouth See admin instructions. Take 1 tablet by mouth once daily in the afternoon.)   OXYGEN  Inhale into the lungs. 2 L  cont.   pantoprazole  (PROTONIX ) 40 MG tablet Take 1 tablet (40 mg total) by mouth daily.   pravastatin  (PRAVACHOL ) 40 MG tablet TAKE 1 TABLET BY MOUTH EVERY DAY IN THE EVENING (Patient taking differently: Take 40 mg by mouth at bedtime. TAKE 1 TABLET BY MOUTH EVERY DAY IN THE EVENING)   predniSONE  (STERAPRED UNI-PAK 21 TAB) 10 MG (21) TBPK tablet Use per package instruction   sevelamer  carbonate (RENVELA ) 2.4 g PACK Take 4.8 g by mouth 3 (three) times daily with meals.   traMADol  (ULTRAM ) 50 MG tablet Take 50 mg by mouth daily as needed for severe  pain (pain score 7-10).   traZODone  (DESYREL ) 50 MG tablet TAKE 0.5-1 TABLET (25-50 MG TOTAL) BY MOUTH AT BEDTIME AS NEEDED. FOR SLEEP (Patient taking differently: Take 50 mg by mouth at bedtime.)   UNABLE TO FIND Inhale 1 Device into the lungs at bedtime. CPAP   VELPHORO  500 MG chewable tablet Chew 1,000-1,500 mg by mouth See admin instructions. Chew and swallow 3 tablets (1500mg ) by mouth three times daily with meals and take 2 tablets (1000mg ) with snacks.                                 Objective:   Physical Exam  Wts  07/03/2023      182  08/29/2021        207 08/25/2020         216    08/06/2019      206 01/08/2019     209  10/09/2018       213  07/08/2018       222 03/31/2018         229  12/30/2017  222  09/30/2017         222  07/11/2017       217   10/18/14 233 lb 12.8 oz (106.051 kg)  10/05/14 235 lb 3.2 oz (106.686 kg)  09/20/14 230 lb 6.1 oz (104.5 kg)    Vital signs reviewed  07/03/2023  - Note at rest 02 sats  98% on RA   General appearance:    somber amb bm with somewhat hopeless affect    HEENT : Oropharynx  clear      Nasal turbinates nl    NECK :  without  apparent JVD/ palpable Nodes/TM    LUNGS: no acc muscle use,  Nl contour chest which is clear to A and P bilaterally without cough on insp or exp maneuvers   CV:  RRR  no s3 or murmur or increase in P2, and no edema   ABD:  soft and nontender   MS:  Gait slow   ext warm without deformities Or obvious joint restrictions  calf tenderness, cyanosis or clubbing    SKIN: warm and dry without lesions    NEURO:  alert, approp, nl sensorium with  no motor or cerebellar deficits apparent.       I personally reviewed images and agree with radiology impression as follows:  CXR:   portable 06/19/23 Cardiomegaly with vascular congestion and interstitial prominence similar to the prior chest radiograph. Consider mild chronic congestive heart failure. No evidence of pneumoni       Assessment & Plan:

## 2023-07-01 DIAGNOSIS — N2581 Secondary hyperparathyroidism of renal origin: Secondary | ICD-10-CM | POA: Diagnosis not present

## 2023-07-01 DIAGNOSIS — D509 Iron deficiency anemia, unspecified: Secondary | ICD-10-CM | POA: Diagnosis not present

## 2023-07-01 DIAGNOSIS — N186 End stage renal disease: Secondary | ICD-10-CM | POA: Diagnosis not present

## 2023-07-01 DIAGNOSIS — D631 Anemia in chronic kidney disease: Secondary | ICD-10-CM | POA: Diagnosis not present

## 2023-07-01 DIAGNOSIS — Z992 Dependence on renal dialysis: Secondary | ICD-10-CM | POA: Diagnosis not present

## 2023-07-03 ENCOUNTER — Encounter: Payer: Self-pay | Admitting: Internal Medicine

## 2023-07-03 ENCOUNTER — Other Ambulatory Visit: Payer: Self-pay | Admitting: Internal Medicine

## 2023-07-03 ENCOUNTER — Ambulatory Visit: Admitting: Internal Medicine

## 2023-07-03 VITALS — BP 144/78 | HR 78 | Ht 67.0 in | Wt 182.6 lb

## 2023-07-03 DIAGNOSIS — J4530 Mild persistent asthma, uncomplicated: Secondary | ICD-10-CM | POA: Diagnosis not present

## 2023-07-03 DIAGNOSIS — Z992 Dependence on renal dialysis: Secondary | ICD-10-CM | POA: Diagnosis not present

## 2023-07-03 DIAGNOSIS — N2581 Secondary hyperparathyroidism of renal origin: Secondary | ICD-10-CM | POA: Diagnosis not present

## 2023-07-03 DIAGNOSIS — D631 Anemia in chronic kidney disease: Secondary | ICD-10-CM | POA: Diagnosis not present

## 2023-07-03 DIAGNOSIS — D509 Iron deficiency anemia, unspecified: Secondary | ICD-10-CM | POA: Diagnosis not present

## 2023-07-03 DIAGNOSIS — N186 End stage renal disease: Secondary | ICD-10-CM | POA: Diagnosis not present

## 2023-07-03 MED ORDER — BUDESONIDE-FORMOTEROL FUMARATE 160-4.5 MCG/ACT IN AERO: INHALATION_SPRAY | RESPIRATORY_TRACT | 12 refills | Status: DC

## 2023-07-03 NOTE — Telephone Encounter (Signed)
 Please advise change request

## 2023-07-03 NOTE — Patient Instructions (Addendum)
 For cough/ congestion > mucinex  or mucinex  dm  up to maximum of  1200 mg every 12 hours and use the flutter valve as much as you can    Plan A = Automatic = Always=    Symbicort  160  Take 2 puffs first thing in am and then another 2 puffs about 12 hours later.    Work on inhaler technique:  relax and gently blow all the way out then take a nice smooth full deep breath back in, triggering the inhaler at same time you start breathing in.  Hold breath in for at least  5 seconds if you can. Blow out symbicort   thru nose. Rinse and gargle with water  when done.  If mouth or throat bother you at all,  try brushing teeth/gums/tongue with arm and hammer toothpaste/ make a slurry and gargle and spit out.       Plan B = Backup (to supplement plan A, not to replace it) Only use your albuterol  inhaler as a rescue medication to be used if you can't catch your breath by resting or doing a relaxed purse lip breathing pattern.  - The less you use it, the better it will work when you need it. - Ok to use the inhaler up to 2 puffs  every 4 hours if you must but call for appointment if use goes up over your usual need - Don't leave home without it !!  (think of it like the spare tire for your car)   Plan C = Crisis (instead of Plan B but only if Plan B stops working) - only use your albuterol -ipatropium nebulizer if you first try Plan B and it fails to help > ok to use the nebulizer up to every 4 hours but if start needing it regularly call for immediate appointment  Make sure you check your oxygen  saturation  AT  your highest level of activity (not after you stop)   to be sure it stays over 90% and adjust  02 flow upward to maintain this level if needed but remember to turn it back to previous settings when you stop (to conserve your supply).     Follow up with Beth next available all medications in hand

## 2023-07-04 ENCOUNTER — Other Ambulatory Visit: Payer: Self-pay | Admitting: Internal Medicine

## 2023-07-04 DIAGNOSIS — J4530 Mild persistent asthma, uncomplicated: Secondary | ICD-10-CM

## 2023-07-04 NOTE — Assessment & Plan Note (Addendum)
 Quit smoking 02/2020  - Spirometry 07/11/2017  Restrictive/ min curvature on no rx - 07/11/2017   Try symb 80 2bid - 09/30/2017     try symbicort  160 2bid as has rhonchi on exam, still smoking  - Spirometry 03/31/2018  FEV1 1.4 (41%)  Ratio 0.72 min curvature p symb 160 x 2 in am with poor baseline hfa - 01/08/2019  After extensive coaching inhaler device,  effectiveness =    90% / reviewed golfer analogy taking practice inhalations using empty device   - - 07/03/2023  walked 500 ft quit due to fatigue with sats still 96% slow pace    Completely confused with maint vs prns, names of meds   DDX of  difficult airways management almost all start with A and  include Adherence, Ace Inhibitors, Acid Reflux, Active Sinus Disease, Alpha 1 Antitripsin deficiency, Anxiety/Depression masquerading as Airways dz,  ABPA,  Allergy(esp in young), Aspiration (esp in elderly), Adverse effects of meds,  Active smoking or vaping, A bunch of PE's (a small clot burden can't cause this syndrome unless there is already severe underlying pulm or vascular dz with poor reserve) plus two Bs  = Bronchiectasis and Beta blocker use..and one C= CHF  Bolded dx's of greatest concern but start with Adherence is always the initial prime suspect and is a multilayered concern that requires a trust but verify approach in every patient - starting with knowing how to use medications, especially inhalers, correctly, keeping up with refills and understanding the fundamental difference between maintenance and prns vs those medications only taken for a very short course and then stopped and not refilled.  - return with all meds in hand using a trust but verify approach to confirm accurate Medication  Reconciliation The principal here is that until we are certain that the  patients are doing what we've asked, it makes no sense to ask them to do more.     Rec  Plan A = Automatic = Always=    Symbicort  160  Take 2 puffs first thing in am and then  another 2 puffs about 12 hours later.    Work on inhaler technique:  relax and gently blow all the way out then take a nice smooth full deep breath back in, triggering the inhaler at same time you start breathing in.  Hold breath in for at least  5 seconds if you can. Blow out symbicort   thru nose. Rinse and gargle with water  when done.  If mouth or throat bother you at all,  try brushing teeth/gums/tongue with arm and hammer toothpaste/ make a slurry and gargle and spit out.       Plan B = Backup (to supplement plan A, not to replace it) Only use your albuterol  inhaler as a rescue medication to be used if you can't catch your breath by resting or doing a relaxed purse lip breathing pattern.  - The less you use it, the better it will work when you need it. - Ok to use the inhaler up to 2 puffs  every 4 hours if you must but call for appointment if use goes up over your usual need - Don't leave home without it !!  (think of it like the spare tire for your car)   Plan C = Crisis (instead of Plan B but only if Plan B stops working) - only use your albuterol -ipatropium nebulizer if you first try Plan B and it fails to help > ok to use the nebulizer  up to every 4 hours but if start needing it regularly call for immediate appointment  Make sure you check your oxygen  saturation  AT  your highest level of activity (not after you stop)   to be sure it stays over 90% and adjust  02 flow upward to maintain this level if needed but remember to turn it back to previous settings when you stop (to conserve your supply).   For cough/ congestion > mucinex  or mucinex  dm  up to maximum of  1200 mg every 12 hours and use the flutter valve as much as you can    Follow up with Beth next available all medications in hand   Each maintenance medication was reviewed in detail including emphasizing most importantly the difference between maintenance and prns and under what circumstances the prns are to be triggered using  an action plan format where appropriate.  Total time for H and P, chart review, counseling, reviewing hfa/neb/ 02 /pulse o  device(s) , directly observing portions of ambulatory 02 saturation study/ and generating customized AVS unique to this office visit / same day charting = 31 min

## 2023-07-04 NOTE — Telephone Encounter (Signed)
 Routing bc there is no alternative meds

## 2023-07-05 ENCOUNTER — Other Ambulatory Visit (HOSPITAL_COMMUNITY): Payer: Self-pay

## 2023-07-05 ENCOUNTER — Telehealth: Payer: Self-pay

## 2023-07-05 DIAGNOSIS — N2581 Secondary hyperparathyroidism of renal origin: Secondary | ICD-10-CM | POA: Diagnosis not present

## 2023-07-05 DIAGNOSIS — D631 Anemia in chronic kidney disease: Secondary | ICD-10-CM | POA: Diagnosis not present

## 2023-07-05 DIAGNOSIS — D509 Iron deficiency anemia, unspecified: Secondary | ICD-10-CM | POA: Diagnosis not present

## 2023-07-05 DIAGNOSIS — N186 End stage renal disease: Secondary | ICD-10-CM | POA: Diagnosis not present

## 2023-07-05 DIAGNOSIS — Z992 Dependence on renal dialysis: Secondary | ICD-10-CM | POA: Diagnosis not present

## 2023-07-05 NOTE — Telephone Encounter (Signed)
 Dr Waymond Hailey, can we change Symbicort  to brand name Advair?

## 2023-07-05 NOTE — Telephone Encounter (Signed)
 Brand name Advair HFA is covered. Pharmacy will have to process for Brand.

## 2023-07-05 NOTE — Telephone Encounter (Signed)
 Pharmacy Patient Advocate Encounter   Received notification from RX Request Messages that prior authorization for Advair HFA is required/requested.   Insurance verification completed.   The patient is insured through [employer] .   Per test claim:  Brand Advair HfA is preferred by the insurance.  If suggested medication is appropriate, Please send in a new RX and discontinue this one. If not, please advise as to why it's not appropriate so that we may request a Prior Authorization. Please note, some preferred medications may still require a PA.  If the suggested medications have not been trialed and there are no contraindications to their use, the PA will not be submitted, as it will not be approved.

## 2023-07-08 DIAGNOSIS — D509 Iron deficiency anemia, unspecified: Secondary | ICD-10-CM | POA: Diagnosis not present

## 2023-07-08 DIAGNOSIS — N186 End stage renal disease: Secondary | ICD-10-CM | POA: Diagnosis not present

## 2023-07-08 DIAGNOSIS — N2581 Secondary hyperparathyroidism of renal origin: Secondary | ICD-10-CM | POA: Diagnosis not present

## 2023-07-08 DIAGNOSIS — Z992 Dependence on renal dialysis: Secondary | ICD-10-CM | POA: Diagnosis not present

## 2023-07-08 DIAGNOSIS — D631 Anemia in chronic kidney disease: Secondary | ICD-10-CM | POA: Diagnosis not present

## 2023-07-10 DIAGNOSIS — Z992 Dependence on renal dialysis: Secondary | ICD-10-CM | POA: Diagnosis not present

## 2023-07-10 DIAGNOSIS — D631 Anemia in chronic kidney disease: Secondary | ICD-10-CM | POA: Diagnosis not present

## 2023-07-10 DIAGNOSIS — N2581 Secondary hyperparathyroidism of renal origin: Secondary | ICD-10-CM | POA: Diagnosis not present

## 2023-07-10 DIAGNOSIS — N186 End stage renal disease: Secondary | ICD-10-CM | POA: Diagnosis not present

## 2023-07-10 DIAGNOSIS — D509 Iron deficiency anemia, unspecified: Secondary | ICD-10-CM | POA: Diagnosis not present

## 2023-07-12 DIAGNOSIS — N186 End stage renal disease: Secondary | ICD-10-CM | POA: Diagnosis not present

## 2023-07-12 DIAGNOSIS — D631 Anemia in chronic kidney disease: Secondary | ICD-10-CM | POA: Diagnosis not present

## 2023-07-12 DIAGNOSIS — N2581 Secondary hyperparathyroidism of renal origin: Secondary | ICD-10-CM | POA: Diagnosis not present

## 2023-07-12 DIAGNOSIS — Z992 Dependence on renal dialysis: Secondary | ICD-10-CM | POA: Diagnosis not present

## 2023-07-12 DIAGNOSIS — D509 Iron deficiency anemia, unspecified: Secondary | ICD-10-CM | POA: Diagnosis not present

## 2023-07-15 DIAGNOSIS — D509 Iron deficiency anemia, unspecified: Secondary | ICD-10-CM | POA: Diagnosis not present

## 2023-07-15 DIAGNOSIS — N186 End stage renal disease: Secondary | ICD-10-CM | POA: Diagnosis not present

## 2023-07-15 DIAGNOSIS — N2581 Secondary hyperparathyroidism of renal origin: Secondary | ICD-10-CM | POA: Diagnosis not present

## 2023-07-15 DIAGNOSIS — D631 Anemia in chronic kidney disease: Secondary | ICD-10-CM | POA: Diagnosis not present

## 2023-07-15 DIAGNOSIS — Z992 Dependence on renal dialysis: Secondary | ICD-10-CM | POA: Diagnosis not present

## 2023-07-17 DIAGNOSIS — D631 Anemia in chronic kidney disease: Secondary | ICD-10-CM | POA: Diagnosis not present

## 2023-07-17 DIAGNOSIS — N2581 Secondary hyperparathyroidism of renal origin: Secondary | ICD-10-CM | POA: Diagnosis not present

## 2023-07-17 DIAGNOSIS — D509 Iron deficiency anemia, unspecified: Secondary | ICD-10-CM | POA: Diagnosis not present

## 2023-07-17 DIAGNOSIS — N186 End stage renal disease: Secondary | ICD-10-CM | POA: Diagnosis not present

## 2023-07-17 DIAGNOSIS — Z992 Dependence on renal dialysis: Secondary | ICD-10-CM | POA: Diagnosis not present

## 2023-07-19 DIAGNOSIS — Z992 Dependence on renal dialysis: Secondary | ICD-10-CM | POA: Diagnosis not present

## 2023-07-19 DIAGNOSIS — D509 Iron deficiency anemia, unspecified: Secondary | ICD-10-CM | POA: Diagnosis not present

## 2023-07-19 DIAGNOSIS — N186 End stage renal disease: Secondary | ICD-10-CM | POA: Diagnosis not present

## 2023-07-19 DIAGNOSIS — D631 Anemia in chronic kidney disease: Secondary | ICD-10-CM | POA: Diagnosis not present

## 2023-07-19 DIAGNOSIS — N2581 Secondary hyperparathyroidism of renal origin: Secondary | ICD-10-CM | POA: Diagnosis not present

## 2023-07-22 DIAGNOSIS — D631 Anemia in chronic kidney disease: Secondary | ICD-10-CM | POA: Diagnosis not present

## 2023-07-22 DIAGNOSIS — N186 End stage renal disease: Secondary | ICD-10-CM | POA: Diagnosis not present

## 2023-07-22 DIAGNOSIS — Z992 Dependence on renal dialysis: Secondary | ICD-10-CM | POA: Diagnosis not present

## 2023-07-22 DIAGNOSIS — D509 Iron deficiency anemia, unspecified: Secondary | ICD-10-CM | POA: Diagnosis not present

## 2023-07-22 DIAGNOSIS — N2581 Secondary hyperparathyroidism of renal origin: Secondary | ICD-10-CM | POA: Diagnosis not present

## 2023-07-23 DIAGNOSIS — N186 End stage renal disease: Secondary | ICD-10-CM | POA: Diagnosis not present

## 2023-07-23 DIAGNOSIS — T8612 Kidney transplant failure: Secondary | ICD-10-CM | POA: Diagnosis not present

## 2023-07-23 DIAGNOSIS — Z992 Dependence on renal dialysis: Secondary | ICD-10-CM | POA: Diagnosis not present

## 2023-07-24 DIAGNOSIS — Z992 Dependence on renal dialysis: Secondary | ICD-10-CM | POA: Diagnosis not present

## 2023-07-24 DIAGNOSIS — D631 Anemia in chronic kidney disease: Secondary | ICD-10-CM | POA: Diagnosis not present

## 2023-07-24 DIAGNOSIS — E8770 Fluid overload, unspecified: Secondary | ICD-10-CM | POA: Diagnosis not present

## 2023-07-24 DIAGNOSIS — D509 Iron deficiency anemia, unspecified: Secondary | ICD-10-CM | POA: Diagnosis not present

## 2023-07-24 DIAGNOSIS — N2581 Secondary hyperparathyroidism of renal origin: Secondary | ICD-10-CM | POA: Diagnosis not present

## 2023-07-24 DIAGNOSIS — N186 End stage renal disease: Secondary | ICD-10-CM | POA: Diagnosis not present

## 2023-07-26 DIAGNOSIS — N186 End stage renal disease: Secondary | ICD-10-CM | POA: Diagnosis not present

## 2023-07-26 DIAGNOSIS — D509 Iron deficiency anemia, unspecified: Secondary | ICD-10-CM | POA: Diagnosis not present

## 2023-07-26 DIAGNOSIS — D631 Anemia in chronic kidney disease: Secondary | ICD-10-CM | POA: Diagnosis not present

## 2023-07-26 DIAGNOSIS — E8770 Fluid overload, unspecified: Secondary | ICD-10-CM | POA: Diagnosis not present

## 2023-07-26 DIAGNOSIS — N2581 Secondary hyperparathyroidism of renal origin: Secondary | ICD-10-CM | POA: Diagnosis not present

## 2023-07-26 DIAGNOSIS — Z992 Dependence on renal dialysis: Secondary | ICD-10-CM | POA: Diagnosis not present

## 2023-07-29 ENCOUNTER — Telehealth: Payer: Self-pay | Admitting: Hematology and Oncology

## 2023-07-29 DIAGNOSIS — E8770 Fluid overload, unspecified: Secondary | ICD-10-CM | POA: Diagnosis not present

## 2023-07-29 DIAGNOSIS — N2581 Secondary hyperparathyroidism of renal origin: Secondary | ICD-10-CM | POA: Diagnosis not present

## 2023-07-29 DIAGNOSIS — N186 End stage renal disease: Secondary | ICD-10-CM | POA: Diagnosis not present

## 2023-07-29 DIAGNOSIS — D509 Iron deficiency anemia, unspecified: Secondary | ICD-10-CM | POA: Diagnosis not present

## 2023-07-29 DIAGNOSIS — Z992 Dependence on renal dialysis: Secondary | ICD-10-CM | POA: Diagnosis not present

## 2023-07-29 DIAGNOSIS — D631 Anemia in chronic kidney disease: Secondary | ICD-10-CM | POA: Diagnosis not present

## 2023-07-29 NOTE — Telephone Encounter (Signed)
 called pt to reschedule appt that was requested to cancel by pt

## 2023-07-31 ENCOUNTER — Other Ambulatory Visit: Payer: Medicare Other

## 2023-07-31 ENCOUNTER — Inpatient Hospital Stay

## 2023-07-31 ENCOUNTER — Inpatient Hospital Stay: Admitting: Hematology and Oncology

## 2023-07-31 ENCOUNTER — Ambulatory Visit: Payer: Medicare Other | Admitting: Hematology and Oncology

## 2023-07-31 DIAGNOSIS — N186 End stage renal disease: Secondary | ICD-10-CM | POA: Diagnosis not present

## 2023-07-31 DIAGNOSIS — N2581 Secondary hyperparathyroidism of renal origin: Secondary | ICD-10-CM | POA: Diagnosis not present

## 2023-07-31 DIAGNOSIS — E8770 Fluid overload, unspecified: Secondary | ICD-10-CM | POA: Diagnosis not present

## 2023-07-31 DIAGNOSIS — Z992 Dependence on renal dialysis: Secondary | ICD-10-CM | POA: Diagnosis not present

## 2023-07-31 DIAGNOSIS — D631 Anemia in chronic kidney disease: Secondary | ICD-10-CM | POA: Diagnosis not present

## 2023-07-31 DIAGNOSIS — D509 Iron deficiency anemia, unspecified: Secondary | ICD-10-CM | POA: Diagnosis not present

## 2023-08-01 DIAGNOSIS — N2581 Secondary hyperparathyroidism of renal origin: Secondary | ICD-10-CM | POA: Diagnosis not present

## 2023-08-01 DIAGNOSIS — D509 Iron deficiency anemia, unspecified: Secondary | ICD-10-CM | POA: Diagnosis not present

## 2023-08-01 DIAGNOSIS — D631 Anemia in chronic kidney disease: Secondary | ICD-10-CM | POA: Diagnosis not present

## 2023-08-01 DIAGNOSIS — E8770 Fluid overload, unspecified: Secondary | ICD-10-CM | POA: Diagnosis not present

## 2023-08-01 DIAGNOSIS — N186 End stage renal disease: Secondary | ICD-10-CM | POA: Diagnosis not present

## 2023-08-01 DIAGNOSIS — Z992 Dependence on renal dialysis: Secondary | ICD-10-CM | POA: Diagnosis not present

## 2023-08-02 DIAGNOSIS — N186 End stage renal disease: Secondary | ICD-10-CM | POA: Diagnosis not present

## 2023-08-02 DIAGNOSIS — E8770 Fluid overload, unspecified: Secondary | ICD-10-CM | POA: Diagnosis not present

## 2023-08-02 DIAGNOSIS — Z992 Dependence on renal dialysis: Secondary | ICD-10-CM | POA: Diagnosis not present

## 2023-08-02 DIAGNOSIS — D509 Iron deficiency anemia, unspecified: Secondary | ICD-10-CM | POA: Diagnosis not present

## 2023-08-02 DIAGNOSIS — D631 Anemia in chronic kidney disease: Secondary | ICD-10-CM | POA: Diagnosis not present

## 2023-08-02 DIAGNOSIS — N2581 Secondary hyperparathyroidism of renal origin: Secondary | ICD-10-CM | POA: Diagnosis not present

## 2023-08-05 DIAGNOSIS — N186 End stage renal disease: Secondary | ICD-10-CM | POA: Diagnosis not present

## 2023-08-05 DIAGNOSIS — E8770 Fluid overload, unspecified: Secondary | ICD-10-CM | POA: Diagnosis not present

## 2023-08-05 DIAGNOSIS — D631 Anemia in chronic kidney disease: Secondary | ICD-10-CM | POA: Diagnosis not present

## 2023-08-05 DIAGNOSIS — D509 Iron deficiency anemia, unspecified: Secondary | ICD-10-CM | POA: Diagnosis not present

## 2023-08-05 DIAGNOSIS — N2581 Secondary hyperparathyroidism of renal origin: Secondary | ICD-10-CM | POA: Diagnosis not present

## 2023-08-05 DIAGNOSIS — Z992 Dependence on renal dialysis: Secondary | ICD-10-CM | POA: Diagnosis not present

## 2023-08-07 DIAGNOSIS — E8770 Fluid overload, unspecified: Secondary | ICD-10-CM | POA: Diagnosis not present

## 2023-08-07 DIAGNOSIS — D509 Iron deficiency anemia, unspecified: Secondary | ICD-10-CM | POA: Diagnosis not present

## 2023-08-07 DIAGNOSIS — Z992 Dependence on renal dialysis: Secondary | ICD-10-CM | POA: Diagnosis not present

## 2023-08-07 DIAGNOSIS — N2581 Secondary hyperparathyroidism of renal origin: Secondary | ICD-10-CM | POA: Diagnosis not present

## 2023-08-07 DIAGNOSIS — N186 End stage renal disease: Secondary | ICD-10-CM | POA: Diagnosis not present

## 2023-08-07 DIAGNOSIS — D631 Anemia in chronic kidney disease: Secondary | ICD-10-CM | POA: Diagnosis not present

## 2023-08-09 DIAGNOSIS — D631 Anemia in chronic kidney disease: Secondary | ICD-10-CM | POA: Diagnosis not present

## 2023-08-09 DIAGNOSIS — D509 Iron deficiency anemia, unspecified: Secondary | ICD-10-CM | POA: Diagnosis not present

## 2023-08-09 DIAGNOSIS — N186 End stage renal disease: Secondary | ICD-10-CM | POA: Diagnosis not present

## 2023-08-09 DIAGNOSIS — Z992 Dependence on renal dialysis: Secondary | ICD-10-CM | POA: Diagnosis not present

## 2023-08-09 DIAGNOSIS — N2581 Secondary hyperparathyroidism of renal origin: Secondary | ICD-10-CM | POA: Diagnosis not present

## 2023-08-09 DIAGNOSIS — E8770 Fluid overload, unspecified: Secondary | ICD-10-CM | POA: Diagnosis not present

## 2023-08-12 DIAGNOSIS — Z992 Dependence on renal dialysis: Secondary | ICD-10-CM | POA: Diagnosis not present

## 2023-08-12 DIAGNOSIS — N186 End stage renal disease: Secondary | ICD-10-CM | POA: Diagnosis not present

## 2023-08-12 DIAGNOSIS — D509 Iron deficiency anemia, unspecified: Secondary | ICD-10-CM | POA: Diagnosis not present

## 2023-08-12 DIAGNOSIS — E8770 Fluid overload, unspecified: Secondary | ICD-10-CM | POA: Diagnosis not present

## 2023-08-12 DIAGNOSIS — D631 Anemia in chronic kidney disease: Secondary | ICD-10-CM | POA: Diagnosis not present

## 2023-08-12 DIAGNOSIS — N2581 Secondary hyperparathyroidism of renal origin: Secondary | ICD-10-CM | POA: Diagnosis not present

## 2023-08-14 DIAGNOSIS — D631 Anemia in chronic kidney disease: Secondary | ICD-10-CM | POA: Diagnosis not present

## 2023-08-14 DIAGNOSIS — E8770 Fluid overload, unspecified: Secondary | ICD-10-CM | POA: Diagnosis not present

## 2023-08-14 DIAGNOSIS — Z992 Dependence on renal dialysis: Secondary | ICD-10-CM | POA: Diagnosis not present

## 2023-08-14 DIAGNOSIS — N2581 Secondary hyperparathyroidism of renal origin: Secondary | ICD-10-CM | POA: Diagnosis not present

## 2023-08-14 DIAGNOSIS — N186 End stage renal disease: Secondary | ICD-10-CM | POA: Diagnosis not present

## 2023-08-14 DIAGNOSIS — D509 Iron deficiency anemia, unspecified: Secondary | ICD-10-CM | POA: Diagnosis not present

## 2023-08-16 DIAGNOSIS — Z992 Dependence on renal dialysis: Secondary | ICD-10-CM | POA: Diagnosis not present

## 2023-08-16 DIAGNOSIS — E8770 Fluid overload, unspecified: Secondary | ICD-10-CM | POA: Diagnosis not present

## 2023-08-16 DIAGNOSIS — N186 End stage renal disease: Secondary | ICD-10-CM | POA: Diagnosis not present

## 2023-08-16 DIAGNOSIS — D631 Anemia in chronic kidney disease: Secondary | ICD-10-CM | POA: Diagnosis not present

## 2023-08-16 DIAGNOSIS — N2581 Secondary hyperparathyroidism of renal origin: Secondary | ICD-10-CM | POA: Diagnosis not present

## 2023-08-16 DIAGNOSIS — D509 Iron deficiency anemia, unspecified: Secondary | ICD-10-CM | POA: Diagnosis not present

## 2023-08-19 DIAGNOSIS — N186 End stage renal disease: Secondary | ICD-10-CM | POA: Diagnosis not present

## 2023-08-19 DIAGNOSIS — E8770 Fluid overload, unspecified: Secondary | ICD-10-CM | POA: Diagnosis not present

## 2023-08-19 DIAGNOSIS — D509 Iron deficiency anemia, unspecified: Secondary | ICD-10-CM | POA: Diagnosis not present

## 2023-08-19 DIAGNOSIS — N2581 Secondary hyperparathyroidism of renal origin: Secondary | ICD-10-CM | POA: Diagnosis not present

## 2023-08-19 DIAGNOSIS — Z992 Dependence on renal dialysis: Secondary | ICD-10-CM | POA: Diagnosis not present

## 2023-08-19 DIAGNOSIS — D631 Anemia in chronic kidney disease: Secondary | ICD-10-CM | POA: Diagnosis not present

## 2023-08-21 DIAGNOSIS — Z992 Dependence on renal dialysis: Secondary | ICD-10-CM | POA: Diagnosis not present

## 2023-08-21 DIAGNOSIS — N186 End stage renal disease: Secondary | ICD-10-CM | POA: Diagnosis not present

## 2023-08-21 DIAGNOSIS — D631 Anemia in chronic kidney disease: Secondary | ICD-10-CM | POA: Diagnosis not present

## 2023-08-21 DIAGNOSIS — E8770 Fluid overload, unspecified: Secondary | ICD-10-CM | POA: Diagnosis not present

## 2023-08-21 DIAGNOSIS — D509 Iron deficiency anemia, unspecified: Secondary | ICD-10-CM | POA: Diagnosis not present

## 2023-08-21 DIAGNOSIS — N2581 Secondary hyperparathyroidism of renal origin: Secondary | ICD-10-CM | POA: Diagnosis not present

## 2023-08-23 DIAGNOSIS — N2581 Secondary hyperparathyroidism of renal origin: Secondary | ICD-10-CM | POA: Diagnosis not present

## 2023-08-23 DIAGNOSIS — T8612 Kidney transplant failure: Secondary | ICD-10-CM | POA: Diagnosis not present

## 2023-08-23 DIAGNOSIS — N186 End stage renal disease: Secondary | ICD-10-CM | POA: Diagnosis not present

## 2023-08-23 DIAGNOSIS — D631 Anemia in chronic kidney disease: Secondary | ICD-10-CM | POA: Diagnosis not present

## 2023-08-23 DIAGNOSIS — Z992 Dependence on renal dialysis: Secondary | ICD-10-CM | POA: Diagnosis not present

## 2023-08-23 DIAGNOSIS — D509 Iron deficiency anemia, unspecified: Secondary | ICD-10-CM | POA: Diagnosis not present

## 2023-08-26 ENCOUNTER — Encounter (HOSPITAL_BASED_OUTPATIENT_CLINIC_OR_DEPARTMENT_OTHER): Payer: Self-pay | Admitting: Primary Care

## 2023-08-26 ENCOUNTER — Ambulatory Visit (HOSPITAL_BASED_OUTPATIENT_CLINIC_OR_DEPARTMENT_OTHER): Admitting: Primary Care

## 2023-08-26 DIAGNOSIS — N2581 Secondary hyperparathyroidism of renal origin: Secondary | ICD-10-CM | POA: Diagnosis not present

## 2023-08-26 DIAGNOSIS — D631 Anemia in chronic kidney disease: Secondary | ICD-10-CM | POA: Diagnosis not present

## 2023-08-26 DIAGNOSIS — D509 Iron deficiency anemia, unspecified: Secondary | ICD-10-CM | POA: Diagnosis not present

## 2023-08-26 DIAGNOSIS — Z992 Dependence on renal dialysis: Secondary | ICD-10-CM | POA: Diagnosis not present

## 2023-08-26 DIAGNOSIS — N186 End stage renal disease: Secondary | ICD-10-CM | POA: Diagnosis not present

## 2023-08-26 NOTE — Progress Notes (Deleted)
 @Patient  ID: Kirk [name] List, male    DOB: April 13, 1960, 63 y.o.   MRN: [id number]  No chief complaint on file.   Referring provider: Leonarda Roxan [name], NP  HPI:   08/26/2023 63 year old male, Former smoker quit in April 2022 (16-pack-year history).  Past medical history significant for mild persistent asthma, systolic heart failure, hypertension, OSA on CPAP, type 2 diabetes, end-stage renal disease on dialysis.  Patient of Dr. Darlean.  Maintained on Symbicort  160, as needed albuterol  HFA or DuoNeb.  Spirometry in February 2023 showed moderate airway obstruction/FEV 1 42%.   09/18/2022 Patient presents today for overdue follow-up asthma. Patient follows with Dr. Darlean. Reports increased shortness of breath symptoms with exertion and cough since having rhinovirus and RSV in April 2024. These symptoms are not new. He gets winded when walking. He ran out of symbicort  last month. Symptoms were previously well controlled. He has associated sinus congestion/PND. Cough is productive with clear sputum is clear. He has not tried any nasal sprays. CT chest in April 2024 showed mucus plugging, mild consolidations and numerous small pulmonary nodules most prominent in lower lungs, slightly worse when compared to prior exam.  Findings felt to be likely due to infection or aspiration. CXR in July without acute cardiopulmonary process.  10/23/2022 Patient was treated for an episode of bronchitis back in August with Doxycycline  and Robitussin DM. Accompanied by his wife/ daughter. He reports that his cough is no better. He has a persistent daily cough daily which is occasionally productive. Dyspnea is prominently with exertion. Associated wheezing. He is maintained on Symbicort  160. CXR in July was normal   Recommendations: Take mucinex  600-1200mg  twice daily x 5-7 days  (not with robitussin)   Orders: Depo-medrol  80mg  IM x1 CXR and labs    Rx: Prednisone  taper    Follow-up: 2-4 weeks with Beth NP     11/13/2022- Interim hx  Patient presents today for 2-4 week follow-up bronchitis. Seen on 10/23/22. Received depo-medrol  80mg  IM x1 and prednisone  taper. CXR ordered but not completed. He was seen in ED on 10/25/22 for acute bronchospasm, tachypnea and wheezing noted.  Rceived albuterol  5mg  and atrovent  nebulizer. Provided with aeroschamer, lost his nebulizer during move. CXR showed reticulation lower lungs which correlated with chronic lung disease seen with prior CT, no acute superimposed process.   CT chest April 2024 showed mucous plugging, mild consolidations with numerous small pulmonary nodules most pronounced in lower lungs. Findings likely due to infection or aspiration.    07/03/2023   re-establish post hosp  ov/Wert re: AB   maint on no rx  -really confused  Chief Complaint  Patient presents with   Follow-up    When to hospital couple a weeks ago , he was having trouble breathing,  his wife stated that she he started back on the allergy medication and mucusix for coughing, he is on oxygen  was put on 2L cont.    Dyspnea:  room to room doe and then puts on 02 p stops  Cough: congested but no mucus Sleeping: flat bed /  3 pillows 3-4 am   wakes up with cough / wheeze/ sob even on 02   SABA use: not sure what  02: 2lpm HS /  prn     No obvious day to day or daytime variability or assoc excess/ purulent sputum or mucus plugs or hemoptysis or cp or chest tightness, subjective wheeze or overt sinus or hb symptoms.    Also denies any obvious fluctuation  of symptoms with weather or environmental changes or other aggravating or alleviating factors except as outlined above   No unusual exposure hx or h/o childhood pna/ asthma or knowledge of premature birth.  Current Allergies, Complete Past Medical History, Past Surgical History, Family History, and Social History were reviewed in [employer] record.  Discussed the use of AI scribe software for clinical note  transcription with the patient, who gave verbal consent to proceed.  08/26/2023 History of Present Illness       Allergies  Allergen Reactions   Codeine Nausea And Vomiting   Hydrocodone -Acetaminophen  Nausea And Vomiting    Immunization History  Administered Date(s) Administered   Influenza Whole 11/01/2010   Influenza, Mdck, Trivalent,PF 6+ MOS(egg free) 10/24/2022, 11/19/2022   Influenza, Seasonal, Injecte, Preservative Fre 10/29/2012   Influenza-Unspecified 11/21/2011, 11/05/2012, 09/22/2013, 09/22/2017, 11/23/2019, 10/28/2020, 12/04/2020   Moderna Sars-Covid-2 Vaccination 04/06/2019, 05/07/2019, 03/31/2020   PPD Test 12/26/2012   Pneumococcal Polysaccharide-23 11/01/2010, 10/29/2012, 11/23/2022, 11/30/2022   Pneumococcal-Unspecified 11/05/2012   Tdap 01/22/2006, 06/08/2021    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)    Anxiety    Asthma    Asthma    moderate persistent   Carpal tunnel syndrome    Cellulitis and abscess of trunk    Cholelithiasis 07/13/2014   Chronic headaches    Cigarette smoker 07/11/2017   Stopped 2/022  - referred to start Lung cancer screening 04/2021         Debility, unspecified    Dermatophytosis of the body    Dysrhythmia    history of   Edema    End stage renal disease on dialysis (HCC)    MWF; [facility] in [place] (10/21/2014)   Essential hypertension, benign    Generalized anxiety disorder 05/25/2022   GERD (gastroesophageal reflux disease)    Gout, unspecified    History of renal transplant    HTN (hypertension)    Hypertrophy of prostate without urinary obstruction and other lower urinary tract symptoms (LUTS)    Hypotension, unspecified    Hypoxia 03/21/2023   Impotence of organic origin    Insomnia, unspecified    Localization-related (focal) (partial) epilepsy and  epileptic syndromes with complex partial seizures, without mention of intractable epilepsy    12-15-19- Wife states he has NEVER had a seizure    Lumbago    Memory loss    OSA on CPAP    Other and unspecified hyperlipidemia    controlled /managed per wife    Other chronic nonalcoholic liver disease    Other malaise and fatigue    Other nonspecific abnormal serum enzyme levels    Pain in joint, lower leg    Pain in joint, upper arm    Pneumonia several times   PONV (postoperative nausea and vomiting)    Secondary hyperparathyroidism (of renal origin)    Shortness of breath    Sleep apnea    wears cpap    Tension headache    Unspecified constipation    Unspecified essential hypertension    Unspecified hereditary and idiopathic peripheral neuropathy    Unspecified vitamin D  deficiency     Tobacco History: Social History   Tobacco Use  Smoking Status Former   Current packs/day: 0.00   Average packs/day: 0.5 packs/day for 32.0 years (16.0 ttl pk-yrs)  Types: Cigarettes   Start date: 33   Quit date: 02/24/2020   Years since quitting: 3.5  Smokeless Tobacco Never   Counseling given: Not Answered   Outpatient Medications Prior to Visit  Medication Sig Dispense Refill   acetaminophen  (TYLENOL ) 500 MG tablet Take 1 tablet (500 mg total) by mouth every 6 (six) hours as needed for moderate pain. (Patient taking differently: Take 1,000 mg by mouth every 6 (six) hours as needed for moderate pain (pain score 4-6) or headache.)     albuterol  (PROVENTIL ) (2.5 MG/3ML) 0.083% nebulizer solution Take 3 mLs (2.5 mg total) by nebulization every 4 (four) hours as needed for wheezing or shortness of breath. 90 mL 0   albuterol  (VENTOLIN  HFA) 108 (90 Base) MCG/ACT inhaler Inhale 2 puffs into the lungs every 6 (six) hours as needed for wheezing or shortness of breath. 8 g 6   amLODipine  (NORVASC ) 10 MG tablet TAKE 1 TABLET BY MOUTH EVERY DAY (Patient taking differently: Take 10 mg by mouth See  admin instructions. Take 1 tablet (10mg ) by mouth once daily in the afternoon.) 90 tablet 3   aspirin  EC 81 MG tablet Take 81 mg by mouth daily in the afternoon.     Cholecalciferol  (VITAMIN D3) 25 MCG (1000 UT) CAPS Take 4,000 Units by mouth daily.     doxazosin  (CARDURA ) 4 MG tablet Take 1 tablet (4 mg total) by mouth at bedtime. 90 tablet 3   DULoxetine  (CYMBALTA ) 60 MG capsule Take 60 mg by mouth in the morning.     fluticasone -salmeterol (ADVAIR HFA) 45-21 MCG/ACT inhaler  12 g 11   guaiFENesin  (MUCINEX ) 600 MG 12 hr tablet Take 1,200 mg by mouth 2 (two) times daily as needed for cough or to loosen phlegm.     ipratropium-albuterol  (DUONEB) 0.5-2.5 (3) MG/3ML SOLN Take 3 mLs by nebulization every 6 (six) hours. Please take every 6 hours scheduled for next 5 days then as needed. 360 mL 0   isosorbide  mononitrate (IMDUR ) 60 MG 24 hr tablet Take 60 mg by mouth daily.     metoprolol  tartrate (LOPRESSOR ) 50 MG tablet Take 1 tablet (50 mg total) by mouth 2 (two) times daily. (Patient taking differently: Take 50 mg by mouth See admin instructions. Take 1 tablet (50mg ) by mouth twice daily, in the afternoon and at bedtime.) 180 tablet 3   multivitamin (RENA-VIT) TABS tablet Take 1 tablet by mouth daily. (Patient taking differently: Take 1 tablet by mouth See admin instructions. Take 1 tablet by mouth once daily in the afternoon.) 90 tablet 1   OXYGEN  Inhale into the lungs. 2 L  cont.     pantoprazole  (PROTONIX ) 40 MG tablet Take 1 tablet (40 mg total) by mouth daily. 90 tablet 0   pravastatin  (PRAVACHOL ) 40 MG tablet TAKE 1 TABLET BY MOUTH EVERY DAY IN THE EVENING (Patient taking differently: Take 40 mg by mouth at bedtime. TAKE 1 TABLET BY MOUTH EVERY DAY IN THE EVENING) 90 tablet 2   predniSONE  (STERAPRED UNI-PAK 21 TAB) 10 MG (21) TBPK tablet Use per package instruction 21 tablet 0   sevelamer  carbonate (RENVELA ) 2.4 g PACK Take 4.8 g by mouth 3 (three) times daily with meals.     traMADol  (ULTRAM )  50 MG tablet Take 50 mg by mouth daily as needed for severe pain (pain score 7-10).     traZODone  (DESYREL ) 50 MG tablet TAKE 0.5-1 TABLET (25-50 MG TOTAL) BY MOUTH AT BEDTIME AS NEEDED. FOR SLEEP (Patient taking differently: Take  50 mg by mouth at bedtime.) 90 tablet 1   UNABLE TO FIND Inhale 1 Device into the lungs at bedtime. CPAP     VELPHORO  500 MG chewable tablet Chew 1,000-1,500 mg by mouth See admin instructions. Chew and swallow 3 tablets (1500mg ) by mouth three times daily with meals and take 2 tablets (1000mg ) with snacks.     Facility-Administered Medications Prior to Visit  Medication Dose Route Frequency Provider Last Rate Last Admin   sodium chloride  flush (NS) 0.9 % injection 10 mL  10 mL Intravenous Q12H Christine Males, PA-C          Review of Systems  Review of Systems   Physical Exam  There were no vitals taken for this visit. Physical Exam  ***  Lab Results:  CBC    Component Value Date/Time   WBC 7.5 06/24/2023 1914   RBC 3.00 (L) 06/24/2023 1914   HGB 9.9 (L) 06/24/2023 1914   HGB 10.4 (L) 01/31/2023 1414   HCT 29.5 (L) 06/24/2023 1914   PLT 132 (L) 06/24/2023 1914   PLT 100 (L) 01/31/2023 1414   MCV 98.3 06/24/2023 1914   MCH 33.0 06/24/2023 1914   MCHC 33.6 06/24/2023 1914   RDW 14.1 06/24/2023 1914   RDW 16.7 (H) 11/26/2013 0914   LYMPHSABS 0.5 (L) 06/19/2023 1606   LYMPHSABS 1.3 11/26/2013 0914   MONOABS 0.3 06/19/2023 1606   EOSABS 0.1 06/19/2023 1606   EOSABS 0.2 11/26/2013 0914   BASOSABS 0.0 06/19/2023 1606   BASOSABS 0.1 11/26/2013 0914    BMET    Component Value Date/Time   NA 135 06/24/2023 1914   NA 136 11/26/2013 0914   K 4.2 06/24/2023 1914   CL 95 (L) 06/24/2023 1914   CO2 25 06/24/2023 1914   GLUCOSE 207 (H) 06/24/2023 1914   BUN 54 (H) 06/24/2023 1914   BUN 66 (A) 05/25/2015 0000   BUN 66 (A) 05/25/2015 0000   CREATININE 7.85 (H) 06/24/2023 1914   CREATININE 8.54 (HH) 01/31/2023 1414   CREATININE 10.02 (H)  01/29/2023 1130   CALCIUM  9.3 06/24/2023 1914   CALCIUM  8.8 06/08/2015 0000   CALCIUM  7.9 (L) 01/19/2010 1016   GFRNONAA 7 (L) 06/24/2023 1914   GFRNONAA 7 (L) 01/31/2023 1414   GFRAA 4 (L) 07/15/2019 0704    BNP    Component Value Date/Time   BNP 1,727.0 (H) 06/19/2023 1606    ProBNP    Component Value Date/Time   PROBNP 725.4 (H) 08/04/2011 0520    Imaging: No results found.   Assessment & Plan:   No problem-specific Assessment & Plan notes found for this encounter.   There are no diagnoses linked to this encounter.  Assessment and Plan Assessment & Plan        Almarie LELON Ferrari, NP 08/26/2023

## 2023-08-28 DIAGNOSIS — N186 End stage renal disease: Secondary | ICD-10-CM | POA: Diagnosis not present

## 2023-08-28 DIAGNOSIS — Z992 Dependence on renal dialysis: Secondary | ICD-10-CM | POA: Diagnosis not present

## 2023-08-28 DIAGNOSIS — D509 Iron deficiency anemia, unspecified: Secondary | ICD-10-CM | POA: Diagnosis not present

## 2023-08-28 DIAGNOSIS — N2581 Secondary hyperparathyroidism of renal origin: Secondary | ICD-10-CM | POA: Diagnosis not present

## 2023-08-28 DIAGNOSIS — D631 Anemia in chronic kidney disease: Secondary | ICD-10-CM | POA: Diagnosis not present

## 2023-08-30 DIAGNOSIS — N2581 Secondary hyperparathyroidism of renal origin: Secondary | ICD-10-CM | POA: Diagnosis not present

## 2023-08-30 DIAGNOSIS — D631 Anemia in chronic kidney disease: Secondary | ICD-10-CM | POA: Diagnosis not present

## 2023-08-30 DIAGNOSIS — Z992 Dependence on renal dialysis: Secondary | ICD-10-CM | POA: Diagnosis not present

## 2023-08-30 DIAGNOSIS — N186 End stage renal disease: Secondary | ICD-10-CM | POA: Diagnosis not present

## 2023-08-30 DIAGNOSIS — D509 Iron deficiency anemia, unspecified: Secondary | ICD-10-CM | POA: Diagnosis not present

## 2023-09-02 DIAGNOSIS — Z992 Dependence on renal dialysis: Secondary | ICD-10-CM | POA: Diagnosis not present

## 2023-09-02 DIAGNOSIS — N186 End stage renal disease: Secondary | ICD-10-CM | POA: Diagnosis not present

## 2023-09-02 DIAGNOSIS — D509 Iron deficiency anemia, unspecified: Secondary | ICD-10-CM | POA: Diagnosis not present

## 2023-09-02 DIAGNOSIS — N2581 Secondary hyperparathyroidism of renal origin: Secondary | ICD-10-CM | POA: Diagnosis not present

## 2023-09-02 DIAGNOSIS — D631 Anemia in chronic kidney disease: Secondary | ICD-10-CM | POA: Diagnosis not present

## 2023-09-04 DIAGNOSIS — N186 End stage renal disease: Secondary | ICD-10-CM | POA: Diagnosis not present

## 2023-09-04 DIAGNOSIS — D631 Anemia in chronic kidney disease: Secondary | ICD-10-CM | POA: Diagnosis not present

## 2023-09-04 DIAGNOSIS — N2581 Secondary hyperparathyroidism of renal origin: Secondary | ICD-10-CM | POA: Diagnosis not present

## 2023-09-04 DIAGNOSIS — D509 Iron deficiency anemia, unspecified: Secondary | ICD-10-CM | POA: Diagnosis not present

## 2023-09-04 DIAGNOSIS — Z992 Dependence on renal dialysis: Secondary | ICD-10-CM | POA: Diagnosis not present

## 2023-09-06 DIAGNOSIS — D509 Iron deficiency anemia, unspecified: Secondary | ICD-10-CM | POA: Diagnosis not present

## 2023-09-06 DIAGNOSIS — N186 End stage renal disease: Secondary | ICD-10-CM | POA: Diagnosis not present

## 2023-09-06 DIAGNOSIS — Z992 Dependence on renal dialysis: Secondary | ICD-10-CM | POA: Diagnosis not present

## 2023-09-06 DIAGNOSIS — D631 Anemia in chronic kidney disease: Secondary | ICD-10-CM | POA: Diagnosis not present

## 2023-09-06 DIAGNOSIS — N2581 Secondary hyperparathyroidism of renal origin: Secondary | ICD-10-CM | POA: Diagnosis not present

## 2023-09-09 DIAGNOSIS — N2581 Secondary hyperparathyroidism of renal origin: Secondary | ICD-10-CM | POA: Diagnosis not present

## 2023-09-09 DIAGNOSIS — D631 Anemia in chronic kidney disease: Secondary | ICD-10-CM | POA: Diagnosis not present

## 2023-09-09 DIAGNOSIS — Z992 Dependence on renal dialysis: Secondary | ICD-10-CM | POA: Diagnosis not present

## 2023-09-09 DIAGNOSIS — D509 Iron deficiency anemia, unspecified: Secondary | ICD-10-CM | POA: Diagnosis not present

## 2023-09-09 DIAGNOSIS — N186 End stage renal disease: Secondary | ICD-10-CM | POA: Diagnosis not present

## 2023-09-11 DIAGNOSIS — N186 End stage renal disease: Secondary | ICD-10-CM | POA: Diagnosis not present

## 2023-09-11 DIAGNOSIS — D631 Anemia in chronic kidney disease: Secondary | ICD-10-CM | POA: Diagnosis not present

## 2023-09-11 DIAGNOSIS — D509 Iron deficiency anemia, unspecified: Secondary | ICD-10-CM | POA: Diagnosis not present

## 2023-09-11 DIAGNOSIS — Z992 Dependence on renal dialysis: Secondary | ICD-10-CM | POA: Diagnosis not present

## 2023-09-11 DIAGNOSIS — N2581 Secondary hyperparathyroidism of renal origin: Secondary | ICD-10-CM | POA: Diagnosis not present

## 2023-09-13 DIAGNOSIS — N2581 Secondary hyperparathyroidism of renal origin: Secondary | ICD-10-CM | POA: Diagnosis not present

## 2023-09-13 DIAGNOSIS — D631 Anemia in chronic kidney disease: Secondary | ICD-10-CM | POA: Diagnosis not present

## 2023-09-13 DIAGNOSIS — Z992 Dependence on renal dialysis: Secondary | ICD-10-CM | POA: Diagnosis not present

## 2023-09-13 DIAGNOSIS — N186 End stage renal disease: Secondary | ICD-10-CM | POA: Diagnosis not present

## 2023-09-13 DIAGNOSIS — D509 Iron deficiency anemia, unspecified: Secondary | ICD-10-CM | POA: Diagnosis not present

## 2023-09-16 DIAGNOSIS — D509 Iron deficiency anemia, unspecified: Secondary | ICD-10-CM | POA: Diagnosis not present

## 2023-09-16 DIAGNOSIS — D631 Anemia in chronic kidney disease: Secondary | ICD-10-CM | POA: Diagnosis not present

## 2023-09-16 DIAGNOSIS — Z992 Dependence on renal dialysis: Secondary | ICD-10-CM | POA: Diagnosis not present

## 2023-09-16 DIAGNOSIS — N2581 Secondary hyperparathyroidism of renal origin: Secondary | ICD-10-CM | POA: Diagnosis not present

## 2023-09-16 DIAGNOSIS — N186 End stage renal disease: Secondary | ICD-10-CM | POA: Diagnosis not present

## 2023-09-18 DIAGNOSIS — N2581 Secondary hyperparathyroidism of renal origin: Secondary | ICD-10-CM | POA: Diagnosis not present

## 2023-09-18 DIAGNOSIS — D631 Anemia in chronic kidney disease: Secondary | ICD-10-CM | POA: Diagnosis not present

## 2023-09-18 DIAGNOSIS — N186 End stage renal disease: Secondary | ICD-10-CM | POA: Diagnosis not present

## 2023-09-18 DIAGNOSIS — Z992 Dependence on renal dialysis: Secondary | ICD-10-CM | POA: Diagnosis not present

## 2023-09-18 DIAGNOSIS — D509 Iron deficiency anemia, unspecified: Secondary | ICD-10-CM | POA: Diagnosis not present

## 2023-09-20 DIAGNOSIS — D509 Iron deficiency anemia, unspecified: Secondary | ICD-10-CM | POA: Diagnosis not present

## 2023-09-20 DIAGNOSIS — Z992 Dependence on renal dialysis: Secondary | ICD-10-CM | POA: Diagnosis not present

## 2023-09-20 DIAGNOSIS — N2581 Secondary hyperparathyroidism of renal origin: Secondary | ICD-10-CM | POA: Diagnosis not present

## 2023-09-20 DIAGNOSIS — D631 Anemia in chronic kidney disease: Secondary | ICD-10-CM | POA: Diagnosis not present

## 2023-09-20 DIAGNOSIS — N186 End stage renal disease: Secondary | ICD-10-CM | POA: Diagnosis not present

## 2023-09-23 DIAGNOSIS — D631 Anemia in chronic kidney disease: Secondary | ICD-10-CM | POA: Diagnosis not present

## 2023-09-23 DIAGNOSIS — D509 Iron deficiency anemia, unspecified: Secondary | ICD-10-CM | POA: Diagnosis not present

## 2023-09-23 DIAGNOSIS — Z992 Dependence on renal dialysis: Secondary | ICD-10-CM | POA: Diagnosis not present

## 2023-09-23 DIAGNOSIS — N2581 Secondary hyperparathyroidism of renal origin: Secondary | ICD-10-CM | POA: Diagnosis not present

## 2023-09-23 DIAGNOSIS — N186 End stage renal disease: Secondary | ICD-10-CM | POA: Diagnosis not present

## 2023-09-23 DIAGNOSIS — T8612 Kidney transplant failure: Secondary | ICD-10-CM | POA: Diagnosis not present

## 2023-09-25 DIAGNOSIS — D509 Iron deficiency anemia, unspecified: Secondary | ICD-10-CM | POA: Diagnosis not present

## 2023-09-25 DIAGNOSIS — Z992 Dependence on renal dialysis: Secondary | ICD-10-CM | POA: Diagnosis not present

## 2023-09-25 DIAGNOSIS — N2581 Secondary hyperparathyroidism of renal origin: Secondary | ICD-10-CM | POA: Diagnosis not present

## 2023-09-25 DIAGNOSIS — N186 End stage renal disease: Secondary | ICD-10-CM | POA: Diagnosis not present

## 2023-09-25 DIAGNOSIS — D631 Anemia in chronic kidney disease: Secondary | ICD-10-CM | POA: Diagnosis not present

## 2023-09-26 ENCOUNTER — Encounter: Payer: Medicare Other | Admitting: Family

## 2023-09-27 DIAGNOSIS — D509 Iron deficiency anemia, unspecified: Secondary | ICD-10-CM | POA: Diagnosis not present

## 2023-09-27 DIAGNOSIS — N186 End stage renal disease: Secondary | ICD-10-CM | POA: Diagnosis not present

## 2023-09-27 DIAGNOSIS — N2581 Secondary hyperparathyroidism of renal origin: Secondary | ICD-10-CM | POA: Diagnosis not present

## 2023-09-27 DIAGNOSIS — Z992 Dependence on renal dialysis: Secondary | ICD-10-CM | POA: Diagnosis not present

## 2023-09-27 DIAGNOSIS — D631 Anemia in chronic kidney disease: Secondary | ICD-10-CM | POA: Diagnosis not present

## 2023-09-30 DIAGNOSIS — Z992 Dependence on renal dialysis: Secondary | ICD-10-CM | POA: Diagnosis not present

## 2023-09-30 DIAGNOSIS — N2581 Secondary hyperparathyroidism of renal origin: Secondary | ICD-10-CM | POA: Diagnosis not present

## 2023-09-30 DIAGNOSIS — D631 Anemia in chronic kidney disease: Secondary | ICD-10-CM | POA: Diagnosis not present

## 2023-09-30 DIAGNOSIS — N186 End stage renal disease: Secondary | ICD-10-CM | POA: Diagnosis not present

## 2023-09-30 DIAGNOSIS — D509 Iron deficiency anemia, unspecified: Secondary | ICD-10-CM | POA: Diagnosis not present

## 2023-10-01 ENCOUNTER — Ambulatory Visit: Admitting: Internal Medicine

## 2023-10-02 DIAGNOSIS — D631 Anemia in chronic kidney disease: Secondary | ICD-10-CM | POA: Diagnosis not present

## 2023-10-02 DIAGNOSIS — D509 Iron deficiency anemia, unspecified: Secondary | ICD-10-CM | POA: Diagnosis not present

## 2023-10-02 DIAGNOSIS — N186 End stage renal disease: Secondary | ICD-10-CM | POA: Diagnosis not present

## 2023-10-02 DIAGNOSIS — Z992 Dependence on renal dialysis: Secondary | ICD-10-CM | POA: Diagnosis not present

## 2023-10-02 DIAGNOSIS — N2581 Secondary hyperparathyroidism of renal origin: Secondary | ICD-10-CM | POA: Diagnosis not present

## 2023-10-03 ENCOUNTER — Ambulatory Visit: Admitting: Family

## 2023-10-04 ENCOUNTER — Ambulatory Visit (HOSPITAL_COMMUNITY)
Admission: EM | Disposition: A | Discharge status: Home / Self Care | Payer: Self-pay | Source: Home / Self Care | Attending: Emergency Medicine

## 2023-10-04 ENCOUNTER — Other Ambulatory Visit: Payer: Self-pay

## 2023-10-04 ENCOUNTER — Encounter (HOSPITAL_COMMUNITY): Payer: Self-pay

## 2023-10-04 ENCOUNTER — Observation Stay (HOSPITAL_COMMUNITY)
Admission: EM | Admit: 2023-10-04 | Discharge: 2023-10-08 | Disposition: A | Discharge status: Home / Self Care | Attending: Cardiology | Admitting: Cardiology

## 2023-10-04 ENCOUNTER — Observation Stay (HOSPITAL_COMMUNITY)

## 2023-10-04 DIAGNOSIS — J45909 Unspecified asthma, uncomplicated: Secondary | ICD-10-CM | POA: Diagnosis not present

## 2023-10-04 DIAGNOSIS — R072 Precordial pain: Secondary | ICD-10-CM | POA: Diagnosis present

## 2023-10-04 DIAGNOSIS — R509 Fever, unspecified: Secondary | ICD-10-CM | POA: Diagnosis present

## 2023-10-04 DIAGNOSIS — I110 Hypertensive heart disease with heart failure: Secondary | ICD-10-CM | POA: Diagnosis not present

## 2023-10-04 DIAGNOSIS — I5043 Acute on chronic combined systolic (congestive) and diastolic (congestive) heart failure: Secondary | ICD-10-CM | POA: Diagnosis not present

## 2023-10-04 DIAGNOSIS — Z23 Encounter for immunization: Secondary | ICD-10-CM | POA: Diagnosis not present

## 2023-10-04 DIAGNOSIS — D509 Iron deficiency anemia, unspecified: Secondary | ICD-10-CM | POA: Diagnosis not present

## 2023-10-04 DIAGNOSIS — J189 Pneumonia, unspecified organism: Secondary | ICD-10-CM | POA: Diagnosis not present

## 2023-10-04 DIAGNOSIS — I5023 Acute on chronic systolic (congestive) heart failure: Secondary | ICD-10-CM | POA: Diagnosis present

## 2023-10-04 DIAGNOSIS — R0989 Other specified symptoms and signs involving the circulatory and respiratory systems: Secondary | ICD-10-CM | POA: Diagnosis not present

## 2023-10-04 DIAGNOSIS — N2581 Secondary hyperparathyroidism of renal origin: Secondary | ICD-10-CM | POA: Diagnosis not present

## 2023-10-04 DIAGNOSIS — D649 Anemia, unspecified: Secondary | ICD-10-CM | POA: Diagnosis not present

## 2023-10-04 DIAGNOSIS — Z87891 Personal history of nicotine dependence: Secondary | ICD-10-CM | POA: Insufficient documentation

## 2023-10-04 DIAGNOSIS — J9611 Chronic respiratory failure with hypoxia: Secondary | ICD-10-CM | POA: Insufficient documentation

## 2023-10-04 DIAGNOSIS — I249 Acute ischemic heart disease, unspecified: Secondary | ICD-10-CM | POA: Diagnosis not present

## 2023-10-04 DIAGNOSIS — Z9981 Dependence on supplemental oxygen: Secondary | ICD-10-CM | POA: Diagnosis not present

## 2023-10-04 DIAGNOSIS — R9431 Abnormal electrocardiogram [ECG] [EKG]: Secondary | ICD-10-CM | POA: Diagnosis not present

## 2023-10-04 DIAGNOSIS — I350 Nonrheumatic aortic (valve) stenosis: Secondary | ICD-10-CM | POA: Insufficient documentation

## 2023-10-04 DIAGNOSIS — E785 Hyperlipidemia, unspecified: Secondary | ICD-10-CM | POA: Insufficient documentation

## 2023-10-04 DIAGNOSIS — Z79899 Other long term (current) drug therapy: Secondary | ICD-10-CM | POA: Diagnosis not present

## 2023-10-04 DIAGNOSIS — I428 Other cardiomyopathies: Secondary | ICD-10-CM | POA: Diagnosis not present

## 2023-10-04 DIAGNOSIS — I517 Cardiomegaly: Secondary | ICD-10-CM | POA: Diagnosis not present

## 2023-10-04 DIAGNOSIS — B348 Other viral infections of unspecified site: Secondary | ICD-10-CM | POA: Diagnosis not present

## 2023-10-04 DIAGNOSIS — I429 Cardiomyopathy, unspecified: Secondary | ICD-10-CM | POA: Insufficient documentation

## 2023-10-04 DIAGNOSIS — I132 Hypertensive heart and chronic kidney disease with heart failure and with stage 5 chronic kidney disease, or end stage renal disease: Principal | ICD-10-CM | POA: Insufficient documentation

## 2023-10-04 DIAGNOSIS — R231 Pallor: Secondary | ICD-10-CM | POA: Diagnosis not present

## 2023-10-04 DIAGNOSIS — E876 Hypokalemia: Secondary | ICD-10-CM | POA: Insufficient documentation

## 2023-10-04 DIAGNOSIS — I120 Hypertensive chronic kidney disease with stage 5 chronic kidney disease or end stage renal disease: Secondary | ICD-10-CM | POA: Diagnosis not present

## 2023-10-04 DIAGNOSIS — R0602 Shortness of breath: Secondary | ICD-10-CM | POA: Diagnosis not present

## 2023-10-04 DIAGNOSIS — N186 End stage renal disease: Secondary | ICD-10-CM | POA: Diagnosis not present

## 2023-10-04 DIAGNOSIS — D696 Thrombocytopenia, unspecified: Secondary | ICD-10-CM | POA: Diagnosis not present

## 2023-10-04 DIAGNOSIS — I213 ST elevation (STEMI) myocardial infarction of unspecified site: Secondary | ICD-10-CM | POA: Diagnosis not present

## 2023-10-04 DIAGNOSIS — I499 Cardiac arrhythmia, unspecified: Secondary | ICD-10-CM | POA: Diagnosis not present

## 2023-10-04 DIAGNOSIS — Z7982 Long term (current) use of aspirin: Secondary | ICD-10-CM | POA: Diagnosis not present

## 2023-10-04 DIAGNOSIS — R079 Chest pain, unspecified: Secondary | ICD-10-CM | POA: Diagnosis not present

## 2023-10-04 DIAGNOSIS — I160 Hypertensive urgency: Secondary | ICD-10-CM | POA: Diagnosis present

## 2023-10-04 DIAGNOSIS — Z992 Dependence on renal dialysis: Secondary | ICD-10-CM | POA: Diagnosis not present

## 2023-10-04 DIAGNOSIS — R918 Other nonspecific abnormal finding of lung field: Secondary | ICD-10-CM | POA: Diagnosis not present

## 2023-10-04 DIAGNOSIS — I129 Hypertensive chronic kidney disease with stage 1 through stage 4 chronic kidney disease, or unspecified chronic kidney disease: Secondary | ICD-10-CM

## 2023-10-04 DIAGNOSIS — D631 Anemia in chronic kidney disease: Secondary | ICD-10-CM | POA: Diagnosis not present

## 2023-10-04 DIAGNOSIS — G4733 Obstructive sleep apnea (adult) (pediatric): Secondary | ICD-10-CM | POA: Diagnosis not present

## 2023-10-04 DIAGNOSIS — I5030 Unspecified diastolic (congestive) heart failure: Secondary | ICD-10-CM | POA: Diagnosis not present

## 2023-10-04 HISTORY — PX: LEFT HEART CATH AND CORONARY ANGIOGRAPHY: CATH118249

## 2023-10-04 LAB — CBC WITH DIFFERENTIAL/PLATELET
Abs Immature Granulocytes: 0 K/uL (ref 0.00–0.07)
Basophils Absolute: 0 K/uL (ref 0.0–0.1)
Basophils Relative: 1 %
Eosinophils Absolute: 0 K/uL (ref 0.0–0.5)
Eosinophils Relative: 1 %
HCT: 29.6 % — ABNORMAL LOW (ref 39.0–52.0)
Hemoglobin: 9.4 g/dL — ABNORMAL LOW (ref 13.0–17.0)
Immature Granulocytes: 0 %
Lymphocytes Relative: 11 %
Lymphs Abs: 0.4 K/uL — ABNORMAL LOW (ref 0.7–4.0)
MCH: 30.4 pg (ref 26.0–34.0)
MCHC: 31.8 g/dL (ref 30.0–36.0)
MCV: 95.8 fL (ref 80.0–100.0)
Monocytes Absolute: 0.4 K/uL (ref 0.1–1.0)
Monocytes Relative: 12 %
Neutro Abs: 2.4 K/uL (ref 1.7–7.7)
Neutrophils Relative %: 75 %
Platelets: 78 K/uL — ABNORMAL LOW (ref 150–400)
RBC: 3.09 MIL/uL — ABNORMAL LOW (ref 4.22–5.81)
RDW: 15.5 % (ref 11.5–15.5)
Smear Review: NORMAL
WBC: 3.2 K/uL — ABNORMAL LOW (ref 4.0–10.5)
nRBC: 0 % (ref 0.0–0.2)

## 2023-10-04 LAB — POCT I-STAT 7, (LYTES, BLD GAS, ICA,H+H)
Acid-Base Excess: 10 mmol/L — ABNORMAL HIGH (ref 0.0–2.0)
Bicarbonate: 32.9 mmol/L — ABNORMAL HIGH (ref 20.0–28.0)
Calcium, Ion: 0.99 mmol/L — ABNORMAL LOW (ref 1.15–1.40)
HCT: 28 % — ABNORMAL LOW (ref 39.0–52.0)
Hemoglobin: 9.5 g/dL — ABNORMAL LOW (ref 13.0–17.0)
O2 Saturation: 87 %
Potassium: 3.1 mmol/L — ABNORMAL LOW (ref 3.5–5.1)
Sodium: 137 mmol/L (ref 135–145)
TCO2: 34 mmol/L — ABNORMAL HIGH (ref 22–32)
pCO2 arterial: 34.9 mmHg (ref 32–48)
pH, Arterial: 7.583 — ABNORMAL HIGH (ref 7.35–7.45)
pO2, Arterial: 44 mmHg — ABNORMAL LOW (ref 83–108)

## 2023-10-04 LAB — CBC
HCT: 25.4 % — ABNORMAL LOW (ref 39.0–52.0)
Hemoglobin: 8.2 g/dL — ABNORMAL LOW (ref 13.0–17.0)
MCH: 30.7 pg (ref 26.0–34.0)
MCHC: 32.3 g/dL (ref 30.0–36.0)
MCV: 95.1 fL (ref 80.0–100.0)
Platelets: 76 K/uL — ABNORMAL LOW (ref 150–400)
RBC: 2.67 MIL/uL — ABNORMAL LOW (ref 4.22–5.81)
RDW: 15.5 % (ref 11.5–15.5)
WBC: 3 K/uL — ABNORMAL LOW (ref 4.0–10.5)
nRBC: 0 % (ref 0.0–0.2)

## 2023-10-04 LAB — COMPREHENSIVE METABOLIC PANEL WITH GFR
ALT: 10 U/L (ref 0–44)
AST: 15 U/L (ref 15–41)
Albumin: 3.3 g/dL — ABNORMAL LOW (ref 3.5–5.0)
Alkaline Phosphatase: 65 U/L (ref 38–126)
Anion gap: 13 (ref 5–15)
BUN: 10 mg/dL (ref 8–23)
CO2: 30 mmol/L (ref 22–32)
Calcium: 8.3 mg/dL — ABNORMAL LOW (ref 8.9–10.3)
Chloride: 93 mmol/L — ABNORMAL LOW (ref 98–111)
Creatinine, Ser: 5.33 mg/dL — ABNORMAL HIGH (ref 0.61–1.24)
GFR, Estimated: 11 mL/min — ABNORMAL LOW (ref >60)
Glucose, Bld: 93 mg/dL (ref 70–99)
Potassium: 3 mmol/L — ABNORMAL LOW (ref 3.5–5.1)
Sodium: 136 mmol/L (ref 135–145)
Total Bilirubin: 0.9 mg/dL (ref 0.0–1.2)
Total Protein: 7 g/dL (ref 6.5–8.1)

## 2023-10-04 LAB — TROPONIN I (HIGH SENSITIVITY)
Troponin I (High Sensitivity): 71 ng/L — ABNORMAL HIGH (ref <18)
Troponin I (High Sensitivity): 75 ng/L — ABNORMAL HIGH (ref <18)

## 2023-10-04 LAB — POCT I-STAT, CHEM 8
BUN: 11 mg/dL (ref 8–23)
Calcium, Ion: 0.99 mmol/L — ABNORMAL LOW (ref 1.15–1.40)
Chloride: 92 mmol/L — ABNORMAL LOW (ref 98–111)
Creatinine, Ser: 5.4 mg/dL — ABNORMAL HIGH (ref 0.61–1.24)
Glucose, Bld: 98 mg/dL (ref 70–99)
HCT: 27 % — ABNORMAL LOW (ref 39.0–52.0)
Hemoglobin: 9.2 g/dL — ABNORMAL LOW (ref 13.0–17.0)
Potassium: 3 mmol/L — ABNORMAL LOW (ref 3.5–5.1)
Sodium: 136 mmol/L (ref 135–145)
TCO2: 30 mmol/L (ref 22–32)

## 2023-10-04 LAB — POCT ACTIVATED CLOTTING TIME: Activated Clotting Time: 187 s

## 2023-10-04 LAB — LACTIC ACID, PLASMA: Lactic Acid, Venous: 0.9 mmol/L (ref 0.5–1.9)

## 2023-10-04 LAB — CG4 I-STAT (LACTIC ACID)
Lactic Acid, Venous: 0.6 mmol/L (ref 0.5–1.9)
Lactic Acid, Venous: 0.4 mmol/L — ABNORMAL LOW (ref 0.5–1.9)

## 2023-10-04 LAB — BRAIN NATRIURETIC PEPTIDE: B Natriuretic Peptide: 2277.3 pg/mL — ABNORMAL HIGH (ref 0.0–100.0)

## 2023-10-04 SURGERY — LEFT HEART CATH AND CORONARY ANGIOGRAPHY
Anesthesia: LOCAL

## 2023-10-04 MED ORDER — PRAVASTATIN SODIUM 40 MG PO TABS
40.0000 mg | ORAL_TABLET | Freq: Every day | ORAL | Status: DC
  Administered 2023-10-04 – 2023-10-08 (×4): 40 mg via ORAL
  Filled 2023-10-04 (×4): qty 1

## 2023-10-04 MED ORDER — IOHEXOL 350 MG/ML SOLN
INTRAVENOUS | Status: DC | PRN
  Administered 2023-10-04: 55 mL

## 2023-10-04 MED ORDER — LIDOCAINE HCL (PF) 1 % IJ SOLN
INTRAMUSCULAR | Status: DC | PRN
  Administered 2023-10-04: 20 mL

## 2023-10-04 MED ORDER — TRAMADOL HCL 50 MG PO TABS
50.0000 mg | ORAL_TABLET | Freq: Every day | ORAL | Status: DC | PRN
  Administered 2023-10-05 – 2023-10-06 (×2): 50 mg via ORAL
  Filled 2023-10-04 (×2): qty 1

## 2023-10-04 MED ORDER — TRAZODONE HCL 50 MG PO TABS
50.0000 mg | ORAL_TABLET | Freq: Every day | ORAL | Status: DC
  Administered 2023-10-04 – 2023-10-08 (×4): 50 mg via ORAL
  Filled 2023-10-04 (×4): qty 1

## 2023-10-04 MED ORDER — METOPROLOL TARTRATE 50 MG PO TABS
50.0000 mg | ORAL_TABLET | Freq: Two times a day (BID) | ORAL | Status: DC
Start: 2023-10-05 — End: 2023-10-07
  Administered 2023-10-05 – 2023-10-06 (×4): 50 mg via ORAL
  Filled 2023-10-04 (×4): qty 1

## 2023-10-04 MED ORDER — SODIUM CHLORIDE 0.9% FLUSH: 3.0000 mL | INTRAVENOUS | Status: DC | PRN

## 2023-10-04 MED ORDER — ACETAMINOPHEN 500 MG PO TABS: 1000.0000 mg | ORAL_TABLET | Freq: Four times a day (QID) | ORAL | Status: DC | PRN

## 2023-10-04 MED ORDER — RENA-VITE PO TABS
1.0000 | ORAL_TABLET | Freq: Every day | ORAL | Status: DC
  Administered 2023-10-04 – 2023-10-07 (×4): 1 via ORAL
  Filled 2023-10-04 (×5): qty 1

## 2023-10-04 MED ORDER — ASPIRIN 81 MG PO TBEC
81.0000 mg | DELAYED_RELEASE_TABLET | Freq: Every day | ORAL | Status: DC
Start: 2023-10-04 — End: 2023-10-08
  Administered 2023-10-04 – 2023-10-07 (×4): 81 mg via ORAL
  Filled 2023-10-04 (×4): qty 1

## 2023-10-04 MED ORDER — SUCROFERRIC OXYHYDROXIDE 500 MG PO CHEW: 1000.0000 mg | CHEWABLE_TABLET | ORAL | Status: DC

## 2023-10-04 MED ORDER — AMLODIPINE BESYLATE 10 MG PO TABS
10.0000 mg | ORAL_TABLET | Freq: Every day | ORAL | Status: DC
Start: 2023-10-04 — End: 2023-10-06
  Administered 2023-10-04 – 2023-10-05 (×2): 10 mg via ORAL
  Filled 2023-10-04 (×2): qty 1

## 2023-10-04 MED ORDER — MIDAZOLAM HCL 2 MG/2ML IJ SOLN
INTRAMUSCULAR | Status: DC | PRN
  Administered 2023-10-04: 1 mg via INTRAVENOUS

## 2023-10-04 MED ORDER — SEVELAMER CARBONATE 2.4 G PO PACK
4.8000 g | PACK | Freq: Three times a day (TID) | ORAL | Status: DC
  Administered 2023-10-05 – 2023-10-07 (×7): 4.8 g via ORAL
  Filled 2023-10-04 (×11): qty 2

## 2023-10-04 MED ORDER — IPRATROPIUM-ALBUTEROL 0.5-2.5 (3) MG/3ML IN SOLN
3.0000 mL | Freq: Four times a day (QID) | RESPIRATORY_TRACT | Status: DC | PRN
  Administered 2023-10-06 (×2): 3 mL via RESPIRATORY_TRACT
  Filled 2023-10-04: qty 3

## 2023-10-04 MED ORDER — GUAIFENESIN ER 600 MG PO TB12
1200.0000 mg | ORAL_TABLET | Freq: Two times a day (BID) | ORAL | Status: DC | PRN
  Administered 2023-10-05 – 2023-10-06 (×2): 1200 mg via ORAL
  Filled 2023-10-04 (×2): qty 2

## 2023-10-04 MED ORDER — FENTANYL CITRATE (PF) 100 MCG/2ML IJ SOLN
INTRAMUSCULAR | Status: DC | PRN
  Administered 2023-10-04: 25 ug via INTRAVENOUS

## 2023-10-04 MED ORDER — VERAPAMIL HCL 2.5 MG/ML IV SOLN
INTRAVENOUS | Status: AC
Start: 2023-10-04 — End: 2023-10-04
  Filled 2023-10-04: qty 2

## 2023-10-04 MED ORDER — DOXAZOSIN MESYLATE 4 MG PO TABS
4.0000 mg | ORAL_TABLET | Freq: Every day | ORAL | Status: DC
  Administered 2023-10-04 – 2023-10-08 (×4): 4 mg via ORAL
  Filled 2023-10-04 (×4): qty 1

## 2023-10-04 MED ORDER — LABETALOL HCL 5 MG/ML IV SOLN
INTRAVENOUS | Status: DC | PRN
  Administered 2023-10-04 (×2): 10 mg via INTRAVENOUS

## 2023-10-04 MED ORDER — FLUTICASONE FUROATE-VILANTEROL 100-25 MCG/ACT IN AEPB
1.0000 | INHALATION_SPRAY | Freq: Every day | RESPIRATORY_TRACT | Status: DC
  Administered 2023-10-05 – 2023-10-08 (×4): 1 via RESPIRATORY_TRACT
  Filled 2023-10-04: qty 28

## 2023-10-04 MED ORDER — LIDOCAINE HCL (PF) 1 % IJ SOLN
INTRAMUSCULAR | Status: AC
  Filled 2023-10-04: qty 30

## 2023-10-04 MED ORDER — LABETALOL HCL 5 MG/ML IV SOLN
INTRAVENOUS | Status: AC
  Filled 2023-10-04: qty 4

## 2023-10-04 MED ORDER — PANTOPRAZOLE SODIUM 40 MG PO TBEC
40.0000 mg | DELAYED_RELEASE_TABLET | Freq: Every day | ORAL | Status: DC
Start: 2023-10-04 — End: 2023-10-08
  Administered 2023-10-04 – 2023-10-07 (×4): 40 mg via ORAL
  Filled 2023-10-04 (×5): qty 1

## 2023-10-04 MED ORDER — FENTANYL CITRATE (PF) 100 MCG/2ML IJ SOLN
INTRAMUSCULAR | Status: AC
  Filled 2023-10-04: qty 2

## 2023-10-04 MED ORDER — HYDRALAZINE HCL 20 MG/ML IJ SOLN: 10.0000 mg | INTRAMUSCULAR | Status: DC | PRN

## 2023-10-04 MED ORDER — HEPARIN (PORCINE) IN NACL 1000-0.9 UT/500ML-% IV SOLN
INTRAVENOUS | Status: DC | PRN
  Administered 2023-10-04 (×2): 500 mL

## 2023-10-04 MED ORDER — ALBUTEROL SULFATE (2.5 MG/3ML) 0.083% IN NEBU: 2.5000 mg | INHALATION_SOLUTION | RESPIRATORY_TRACT | Status: DC | PRN

## 2023-10-04 MED ORDER — HEPARIN SODIUM (PORCINE) 5000 UNIT/ML IJ SOLN
4000.0000 [IU] | Freq: Once | INTRAMUSCULAR | Status: AC
  Administered 2023-10-04: 4000 [IU] via INTRAVENOUS
  Filled 2023-10-04: qty 1

## 2023-10-04 MED ORDER — LABETALOL HCL 5 MG/ML IV SOLN: 10.0000 mg | INTRAVENOUS | Status: DC | PRN

## 2023-10-04 MED ORDER — HEPARIN SODIUM (PORCINE) 5000 UNIT/ML IJ SOLN
5000.0000 [IU] | Freq: Three times a day (TID) | INTRAMUSCULAR | Status: DC
  Administered 2023-10-04 – 2023-10-07 (×7): 5000 [IU] via SUBCUTANEOUS
  Filled 2023-10-04 (×10): qty 1

## 2023-10-04 MED ORDER — ACETAMINOPHEN 325 MG PO TABS
650.0000 mg | ORAL_TABLET | ORAL | Status: DC | PRN
  Administered 2023-10-05: 650 mg via ORAL
  Filled 2023-10-04: qty 2

## 2023-10-04 MED ORDER — SODIUM CHLORIDE 0.9% FLUSH
3.0000 mL | Freq: Two times a day (BID) | INTRAVENOUS | Status: DC
  Administered 2023-10-04 – 2023-10-08 (×6): 3 mL via INTRAVENOUS

## 2023-10-04 MED ORDER — MIDAZOLAM HCL 2 MG/2ML IJ SOLN
INTRAMUSCULAR | Status: AC
  Filled 2023-10-04: qty 2

## 2023-10-04 MED ORDER — ISOSORBIDE MONONITRATE ER 60 MG PO TB24
60.0000 mg | ORAL_TABLET | Freq: Every day | ORAL | Status: DC
  Administered 2023-10-05 – 2023-10-08 (×3): 60 mg via ORAL
  Filled 2023-10-04 (×3): qty 1

## 2023-10-04 MED ORDER — ONDANSETRON HCL 4 MG/2ML IJ SOLN
INTRAMUSCULAR | Status: AC
  Filled 2023-10-04: qty 2

## 2023-10-04 MED ORDER — ONDANSETRON HCL 4 MG/2ML IJ SOLN
INTRAMUSCULAR | Status: DC | PRN
  Administered 2023-10-04: 4 mg via INTRAVENOUS

## 2023-10-04 MED ORDER — SODIUM CHLORIDE 0.9 % IV SOLN: 250.0000 mL | INTRAVENOUS | Status: AC | PRN

## 2023-10-04 MED ORDER — ONDANSETRON HCL 4 MG/2ML IJ SOLN
4.0000 mg | Freq: Four times a day (QID) | INTRAMUSCULAR | Status: DC | PRN
  Administered 2023-10-04 – 2023-10-08 (×3): 4 mg via INTRAVENOUS
  Filled 2023-10-04 (×3): qty 2

## 2023-10-04 SURGICAL SUPPLY — 9 items
CATH INFINITI 5FR MULTPACK ANG (CATHETERS) IMPLANT
CLOSURE MYNX CONTROL 6F/7F (Vascular Products) IMPLANT
GLIDESHEATH SLEND SS 6F .021 (SHEATH) IMPLANT
GUIDEWIRE INQWIRE 1.5J.035X260 (WIRE) IMPLANT
KIT MICROPUNCTURE NIT STIFF (SHEATH) IMPLANT
PACK CARDIAC CATHETERIZATION (CUSTOM PROCEDURE TRAY) ×1 IMPLANT
SET ATX-X65L (MISCELLANEOUS) IMPLANT
SHEATH PINNACLE 6F 10CM (SHEATH) IMPLANT
SHEATH PROBE COVER 6X72 (BAG) IMPLANT

## 2023-10-04 NOTE — ED Provider Notes (Signed)
 [place] EMERGENCY DEPARTMENT AT [facility] Provider Note   CSN: [id number] Arrival date & time: 10/04/23  1840     Patient presents with: Chest Pain   Kirk [name] is a 63 y.o. male.   Pt with hx esrd/hd, presents via EMS with earlier chest pain and sob. Indicates he gets chest discomfort frequently, was present most of the day including during dialysis, but indicates had normal dialysis. Symptoms at rest, constant, dull, non radiating, not pleuritic. No exertional chest pain or discomfort. No extremity pain or swelling.  Was unaware of fever, but temp 100.8 in ED. Denies fever, chills or sweats. Denies other new pain or symptoms. No headache. No neck pain or stiffness. No sinus pain or drainage. No new or worsening cough. No abdominal pain or nvd. No dysuria or gu c/o - indicates makes no urine at baseline. No rash. No focal extremity pain or swelling.  Had prior cath last year - indicates not aware of significant blockages then. Indicates chest pain currently is improved/resolved.   The history is provided by the patient, medical records and the EMS personnel. The history is limited by the condition of the patient.  Chest Pain Associated symptoms: shortness of breath   Associated symptoms: no abdominal pain, no back pain, no cough, no diaphoresis, no headache, no numbness, no palpitations, no vomiting and no weakness        Prior to Admission medications   Medication Sig Start Date End Date Taking? Authorizing Provider  acetaminophen  (TYLENOL ) 500 MG tablet Take 1 tablet (500 mg total) by mouth every 6 (six) hours as needed for moderate pain. Patient taking differently: Take 1,000 mg by mouth every 6 (six) hours as needed for moderate pain (pain score 4-6) or headache. 09/12/14   Jadine Toribio SQUIBB, MD  albuterol  (PROVENTIL ) (2.5 MG/3ML) 0.083% nebulizer solution Take 3 mLs (2.5 mg total) by nebulization every 4 (four) hours as needed for wheezing or shortness of breath.  06/25/23   Elgergawy, Brayton RAMAN, MD  albuterol  (VENTOLIN  HFA) 108 (90 Base) MCG/ACT inhaler Inhale 2 puffs into the lungs every 6 (six) hours as needed for wheezing or shortness of breath. 10/29/22   Darlean Ozell KATHEE, MD  amLODipine  (NORVASC ) 10 MG tablet TAKE 1 TABLET BY MOUTH EVERY DAY Patient taking differently: Take 10 mg by mouth See admin instructions. Take 1 tablet (10mg ) by mouth once daily in the afternoon. 12/05/22   Ngetich, Dinah C, NP  aspirin  EC 81 MG tablet Take 81 mg by mouth daily in the afternoon.    [provider]  Cholecalciferol  (VITAMIN D3) 25 MCG (1000 UT) CAPS Take 4,000 Units by mouth daily.    [provider]  doxazosin  (CARDURA ) 4 MG tablet Take 1 tablet (4 mg total) by mouth at bedtime. 05/31/23   Vannie Reche RAMAN, NP  DULoxetine  (CYMBALTA ) 60 MG capsule Take 60 mg by mouth in the morning.    [provider]  fluticasone -salmeterol (ADVAIR HFA) 45-21 MCG/ACT inhaler  07/05/23   Darlean Ozell KATHEE, MD  guaiFENesin  (MUCINEX ) 600 MG 12 hr tablet Take 1,200 mg by mouth 2 (two) times daily as needed for cough or to loosen phlegm.    [provider]  ipratropium-albuterol  (DUONEB) 0.5-2.5 (3) MG/3ML SOLN Take 3 mLs by nebulization every 6 (six) hours. Please take every 6 hours scheduled for next 5 days then as needed. 06/25/23   Elgergawy, Brayton RAMAN, MD  isosorbide  mononitrate (IMDUR ) 60 MG 24 hr tablet Take 60  mg by mouth daily. 10/04/22   [provider]  metoprolol  tartrate (LOPRESSOR ) 50 MG tablet Take 1 tablet (50 mg total) by mouth 2 (two) times daily. Patient taking differently: Take 50 mg by mouth See admin instructions. Take 1 tablet (50mg ) by mouth twice daily, in the afternoon and at bedtime. 05/31/23   Walker, Caitlin S, NP  multivitamin (RENA-VIT) TABS tablet Take 1 tablet by mouth daily. Patient taking differently: Take 1 tablet by mouth See admin instructions. Take 1 tablet by mouth once daily in the afternoon. 01/29/23   Ngetich, Roxan [name], NP  OXYGEN  Inhale into the lungs. 2 L  cont.    [provider]  pantoprazole  (PROTONIX ) 40 MG tablet Take 1 tablet (40 mg total) by mouth daily. 06/25/23   Elgergawy, Brayton RAMAN, MD  pravastatin  (PRAVACHOL ) 40 MG tablet TAKE 1 TABLET BY MOUTH EVERY DAY IN THE EVENING Patient taking differently: Take 40 mg by mouth at bedtime. TAKE 1 TABLET BY MOUTH EVERY DAY IN THE EVENING 12/04/22   Ngetich, Dinah C, NP  predniSONE  (STERAPRED UNI-PAK 21 TAB) 10 MG (21) TBPK tablet Use per package instruction 06/25/23   Elgergawy, Brayton RAMAN, MD  sevelamer  carbonate (RENVELA ) 2.4 g PACK Take 4.8 g by mouth 3 (three) times daily with meals.    [provider]  traMADol  (ULTRAM ) 50 MG tablet Take 50 mg by mouth daily as needed for severe pain (pain score 7-10).    [provider]  traZODone  (DESYREL ) 50 MG tablet TAKE 0.5-1 TABLET (25-50 MG TOTAL) BY MOUTH AT BEDTIME AS NEEDED. FOR SLEEP Patient taking differently: Take 50 mg by mouth at bedtime. 04/08/23   Ngetich, Dinah C, NP  UNABLE TO FIND Inhale 1 Device into the lungs at bedtime. CPAP    [provider]  VELPHORO  500 MG chewable tablet Chew 1,000-1,500 mg by mouth See admin instructions. Chew and swallow 3 tablets (1500mg ) by mouth three times daily with meals and take 2 tablets (1000mg ) with snacks. 02/06/23   [provider]    Allergies: Codeine and Hydrocodone -acetaminophen     Review of Systems  Constitutional:  Negative for chills and diaphoresis.  HENT:  Negative for sinus pain and sore throat.   Eyes:  Negative for redness.  Respiratory:  Positive for shortness of breath. Negative for cough.   Cardiovascular:  Positive for chest pain. Negative for palpitations and leg swelling.  Gastrointestinal:  Negative for abdominal pain, diarrhea and vomiting.  Genitourinary:  Negative for dysuria and flank pain.  Musculoskeletal:  Negative for back pain and neck pain.  Skin:  Negative for rash.  Neurological:  Negative  for weakness, numbness and headaches.    Updated Vital Signs BP 125/61 (BP Location: Right Arm)   Pulse 85   Temp 99.8 F (37.7 C) (Oral)   Resp 16   Wt 84.1 kg   SpO2 93%   BMI 29.04 kg/m   Physical Exam Vitals and nursing note reviewed.  Constitutional:      Appearance: Normal appearance. He is well-developed.  HENT:     Head: Atraumatic.     Comments: No sinus, temporal or mastoid tenderness.     Right Ear: Tympanic membrane normal.     Left Ear: Tympanic membrane normal.     Nose: Nose normal. No congestion or rhinorrhea.     Mouth/Throat:     Mouth: Mucous membranes are moist.     Pharynx: Oropharynx is clear. No oropharyngeal exudate or posterior oropharyngeal erythema.  Eyes:     General: No scleral icterus.    Conjunctiva/sclera: Conjunctivae normal.     Pupils: Pupils are equal, round, and reactive to light.  Neck:     Trachea: No tracheal deviation.     Comments: Trachea midline, thyroid  not grossly enlarged or tender. No neck stiffness or rigidity.  Cardiovascular:     Rate and Rhythm: Normal rate and regular rhythm.     Pulses: Normal pulses.     Heart sounds: Normal heart sounds. No murmur heard.    No friction rub. No gallop.  Pulmonary:     Effort: Pulmonary effort is normal. No accessory muscle usage or respiratory distress.     Breath sounds: Normal breath sounds.  Abdominal:     General: Bowel sounds are normal. There is no distension.     Palpations: Abdomen is soft.     Tenderness: There is no abdominal tenderness. There is no guarding.  Genitourinary:    Comments: No cva tenderness. Musculoskeletal:        General: No swelling.     Cervical back: Normal range of motion and neck supple. No rigidity.     Comments: CTLS spine, non tender, aligned, no step off. Good rom bil extremities without pain or focal bony tenderness. RUE av fistula w palp thrill.   Skin:    General: Skin is warm and dry.     Findings: No rash.  Neurological:     Mental  Status: He is alert.     Comments: Alert, speech clear. Motor/sens grossly intact bil.   Psychiatric:        Mood and Affect: Mood normal.     (all labs ordered are listed, but only abnormal results are displayed) Results for orders placed or performed during the hospital encounter of 10/04/23  CBC with Differential   Collection Time: 10/04/23  6:55 PM  Result Value Ref Range   WBC 3.2 (L) 4.0 - 10.5 K/uL   RBC 3.09 (L) 4.22 - 5.81 MIL/uL   Hemoglobin 9.4 (L) 13.0 - 17.0 g/dL   HCT 70.3 (L) 60.9 - 47.9 %   MCV 95.8 80.0 - 100.0 fL   MCH 30.4 26.0 - 34.0 pg   MCHC 31.8 30.0 - 36.0 g/dL   RDW 84.4 88.4 - 84.4 %   Platelets 78 (L) 150 - 400 K/uL   nRBC 0.0 0.0 - 0.2 %   Neutrophils Relative % 75 %   Neutro Abs 2.4 1.7 - 7.7 K/uL   Lymphocytes Relative 11 %   Lymphs Abs 0.4 (L) 0.7 - 4.0 K/uL   Monocytes Relative 12 %   Monocytes Absolute 0.4 0.1 - 1.0 K/uL   Eosinophils Relative 1 %   Eosinophils Absolute 0.0 0.0 - 0.5 K/uL   Basophils Relative 1 %   Basophils Absolute 0.0 0.0 - 0.1 K/uL   WBC Morphology MORPHOLOGY UNREMARKABLE    RBC Morphology MORPHOLOGY UNREMARKABLE    Smear Review Normal platelet morphology    Immature Granulocytes 0 %   Abs Immature Granulocytes 0.00 0.00 - 0.07 K/uL  Comprehensive metabolic panel   Collection Time: 10/04/23  6:55 PM  Result Value Ref Range   Sodium 136 135 - 145 mmol/L   Potassium 3.0 (L) 3.5 - 5.1 mmol/L   Chloride 93 (L) 98 - 111 mmol/L   CO2 30 22 - 32 mmol/L   Glucose, Bld 93 70 - 99 mg/dL   BUN 10 8 - 23 mg/dL   Creatinine, Ser 4.66 (  H) 0.61 - 1.24 mg/dL   Calcium  8.3 (L) 8.9 - 10.3 mg/dL   Total Protein 7.0 6.5 - 8.1 g/dL   Albumin  3.3 (L) 3.5 - 5.0 g/dL   AST 15 15 - 41 U/L   ALT 10 0 - 44 U/L   Alkaline Phosphatase 65 38 - 126 U/L   Total Bilirubin 0.9 0.0 - 1.2 mg/dL   GFR, Estimated 11 (L) >60 mL/min   Anion gap 13 5 - 15  Brain natriuretic peptide   Collection Time: 10/04/23  6:55 PM  Result Value Ref Range   B  Natriuretic Peptide 2,277.3 (H) 0.0 - 100.0 pg/mL  Troponin I (High Sensitivity)   Collection Time: 10/04/23  6:55 PM  Result Value Ref Range   Troponin I (High Sensitivity) 71 (H) <18 ng/L  I-STAT, chem 8   Collection Time: 10/04/23  7:24 PM  Result Value Ref Range   Sodium 136 135 - 145 mmol/L   Potassium 3.0 (L) 3.5 - 5.1 mmol/L   Chloride 92 (L) 98 - 111 mmol/L   BUN 11 8 - 23 mg/dL   Creatinine, Ser 4.59 (H) 0.61 - 1.24 mg/dL   Glucose, Bld 98 70 - 99 mg/dL   Calcium , Ion 0.99 (L) 1.15 - 1.40 mmol/L   TCO2 30 22 - 32 mmol/L   Hemoglobin 9.2 (L) 13.0 - 17.0 g/dL   HCT 72.9 (L) 60.9 - 47.9 %  I-STAT 7, (LYTES, BLD GAS, ICA, H+H)   Collection Time: 10/04/23  7:25 PM  Result Value Ref Range   pH, Arterial 7.583 (H) 7.35 - 7.45   pCO2 arterial 34.9 32 - 48 mmHg   pO2, Arterial 44 (L) 83 - 108 mmHg   Bicarbonate 32.9 (H) 20.0 - 28.0 mmol/L   TCO2 34 (H) 22 - 32 mmol/L   O2 Saturation 87 %   Acid-Base Excess 10.0 (H) 0.0 - 2.0 mmol/L   Sodium 137 135 - 145 mmol/L   Potassium 3.1 (L) 3.5 - 5.1 mmol/L   Calcium , Ion 0.99 (L) 1.15 - 1.40 mmol/L   HCT 28.0 (L) 39.0 - 52.0 %   Hemoglobin 9.5 (L) 13.0 - 17.0 g/dL   Sample type ARTERIAL   CG4 I-STAT (Lactic acid)   Collection Time: 10/04/23  7:28 PM  Result Value Ref Range   Lactic Acid, Venous 0.6 0.5 - 1.9 mmol/L  POCT Activated clotting time   Collection Time: 10/04/23  7:36 PM  Result Value Ref Range   Activated Clotting Time 187 seconds  CG4 I-STAT (Lactic acid)   Collection Time: 10/04/23  7:38 PM  Result Value Ref Range   Lactic Acid, Venous 0.4 (L) 0.5 - 1.9 mmol/L  Lactic acid, plasma   Collection Time: 10/04/23  8:32 PM  Result Value Ref Range   Lactic Acid, Venous 0.9 0.5 - 1.9 mmol/L  Troponin I (High Sensitivity)   Collection Time: 10/04/23  8:42 PM  Result Value Ref Range   Troponin I (High Sensitivity) 75 (H) <18 ng/L  CBC   Collection Time: 10/04/23  8:49 PM  Result Value Ref Range   WBC 3.0 (L) 4.0 -  10.5 K/uL   RBC 2.67 (L) 4.22 - 5.81 MIL/uL   Hemoglobin 8.2 (L) 13.0 - 17.0 g/dL   HCT 74.5 (L) 60.9 - 47.9 %   MCV 95.1 80.0 - 100.0 fL   MCH 30.7 26.0 - 34.0 pg   MCHC 32.3 30.0 - 36.0 g/dL   RDW 84.4 88.4 - 84.4 %  Platelets 76 (L) 150 - 400 K/uL   nRBC 0.0 0.0 - 0.2 %   *Note: Due to a large number of results and/or encounters for the requested time period, some results have not been displayed. A complete set of results can be found in Results Review.   EKG: None  Radiology: [facility] Chest Port 1 View Result Date: 10/04/2023 CLINICAL DATA:  Shortness of breath EXAM: PORTABLE CHEST 1 VIEW COMPARISON:  06/19/2023, 03/18/2023 FINDINGS: Hardware in the cervical spine. Cardiomegaly. Aortic atherosclerosis. Cardiomegaly with vascular congestion and diffuse interstitial and ground-glass opacity, slightly progressive, favor pulmonary edema but atypical infection could also produce this appearance IMPRESSION: Cardiomegaly with vascular congestion and diffuse interstitial and ground-glass opacity, slightly progressive, favor pulmonary edema but atypical infection could also produce this appearance. Electronically Signed   By: Luke Bun M.D.   On: 10/04/2023 21:05   CARDIAC CATHETERIZATION Result Date: 10/04/2023 Table formatting from the original result was not included. Images from the original result were not included. Dominance: Right                                                                                LV Gram (2 - Hypokinesis); EF ~35-40%            Angiographically minimal CAD with no change from prior catheterization. Moderate-severely reduced LVEF (estimated 30 to 35% with global hypokinesis) with EDP of 16 to 18 mmHg. => Nonischemic Cardiomyopathy as previously indicated  RECOMMENDATIONS   Anticipated discharge date to be determined.   The patient will be followed by cardiology but will likely need to be comanaged by nephrology and internal medicine. Will need aggressive blood  pressure management.   No indication for antiplatelet therapy at this time . Alm Clay, MD    Procedures   Medications Ordered in the ED  albuterol  (PROVENTIL ) (2.5 MG/3ML) 0.083% nebulizer solution 2.5 mg (has no administration in time range)  amLODipine  (NORVASC ) tablet 10 mg (10 mg Oral Given 10/04/23 2212)  aspirin  EC tablet 81 mg (81 mg Oral Given 10/04/23 2212)  doxazosin  (CARDURA ) tablet 4 mg (has no administration in time range)  fluticasone  furoate-vilanterol (BREO ELLIPTA ) 100-25 MCG/ACT 1 puff (has no administration in time range)  guaiFENesin  (MUCINEX ) 12 hr tablet 1,200 mg (has no administration in time range)  ipratropium-albuterol  (DUONEB) 0.5-2.5 (3) MG/3ML nebulizer solution 3 mL (has no administration in time range)  isosorbide  mononitrate (IMDUR ) 24 hr tablet 60 mg (has no administration in time range)  metoprolol  tartrate (LOPRESSOR ) tablet 50 mg (has no administration in time range)  multivitamin (RENA-VIT) tablet 1 tablet (1 tablet Oral Given 10/04/23 2212)  pravastatin  (PRAVACHOL ) tablet 40 mg (40 mg Oral Given 10/04/23 2212)  pantoprazole  (PROTONIX ) EC tablet 40 mg (40 mg Oral Given 10/04/23 2212)  sevelamer  carbonate (RENVELA ) powder PACK 4.8 g (has no administration in time range)  traMADol  (ULTRAM ) tablet 50 mg (has no administration in time range)  traZODone  (DESYREL ) tablet 50 mg (50 mg Oral Given 10/04/23 2212)  sucroferric oxyhydroxide (VELPHORO ) chewable tablet 1,000-1,500 mg (has no administration in time range)  labetalol  (NORMODYNE ) injection 10 mg (has no administration in time range)  hydrALAZINE  (APRESOLINE ) injection 10 mg (has no  administration in time range)  acetaminophen  (TYLENOL ) tablet 650 mg (has no administration in time range)  ondansetron  (ZOFRAN ) injection 4 mg (4 mg Intravenous Given 10/04/23 2211)  heparin  injection 5,000 Units (5,000 Units Subcutaneous Given 10/04/23 2211)  sodium chloride  flush (NS) 0.9 % injection 3 mL (3 mLs Intravenous  Given 10/04/23 2212)  sodium chloride  flush (NS) 0.9 % injection 3 mL (has no administration in time range)  0.9 %  sodium chloride  infusion (has no administration in time range)  heparin  injection 4,000 Units (4,000 Units Intravenous Given 10/04/23 1855)                                    Medical Decision Making Problems Addressed: Acute febrile illness: acute illness or injury with systemic symptoms that poses a threat to life or bodily functions Chronic anemia: chronic illness or injury ESRD on dialysis [facility]): chronic illness or injury with exacerbation, progression, or side effects of treatment that poses a threat to life or bodily functions Hypokalemia: acute illness or injury Precordial chest pain: acute illness or injury with systemic symptoms that poses a threat to life or bodily functions Shortness of breath: acute illness or injury with systemic symptoms that poses a threat to life or bodily functions Thrombocytopenia (HCC): chronic illness or injury  Amount and/or Complexity of Data Reviewed Independent Historian: EMS    Details: hx External Data Reviewed: notes. Labs: ordered. Decision-making details documented in ED Course. Radiology: ordered and independent interpretation performed. Decision-making details documented in ED Course. ECG/medicine tests: ordered and independent interpretation performed. Decision-making details documented in ED Course. Discussion of management or test interpretation with external provider(s): cardiology  Risk Prescription drug management. Decision regarding hospitalization.   Iv ns. Continuous pulse ox and cardiac monitoring. Labs ordered/sent. Imaging ordered.   Differential diagnosis includes acs, msk cp, gi cp, acute febrile illness, pna, etc. Dispo decision including potential need for admission considered - will get labs and imaging and reassess.   Reviewed nursing notes and prior charts for additional history. External reports  reviewed. Additional history from: EMS.  Prior cardiac cath without significant cad then. Prior cta neg for PE.   EMS activated code stemi pta. Cardiology/Dr Anner, evaluated at bedside and decided to emergently take to cath labs.   Cardiac monitor: sinus rhythm, rate 94.  Labs reviewed/interpreted by me - wbc 3, hgb 9, plt 78, low. Chem w ckd, k not elevated. Bnp elev. Trop mild elev. Lactate normal.   Xrays reviewed/interpreted by me - ?vasc cong, ?atelx vs infil.   Pt was taken emergently to cath lab by cardiology due to acute chest pain and ecg changes. Dr Anner indicates if cath neg for acute process/occlusion, he will consult medicine for admission re cp, sob, and fever, for additional workup and treatment.   CRITICAL CARE RE: acute chest pain/coronary syndrome, acute febrile illness.  Performed by: Dontee Jaso E Earlie Schank Total critical care time: 45 minutes Critical care time was exclusive of separately billable procedures and treating other patients. Critical care was necessary to treat or prevent imminent or life-threatening deterioration. Critical care was time spent personally by me on the following activities: development of treatment plan with patient and/or surrogate as well as nursing, discussions with consultants, evaluation of patient's response to treatment, examination of patient, obtaining history from patient or surrogate, ordering and performing treatments and interventions, ordering and review of laboratory studies, ordering and review of radiographic  studies, pulse oximetry and re-evaluation of patient's condition.         Final diagnoses:  None    ED Discharge Orders     None          Bernard Drivers, MD 10/04/23 2223

## 2023-10-04 NOTE — ED Triage Notes (Signed)
 PER EMS: pt arrives with c/o sharp, central non-radiating chest pain x 2 days. Hx of MI and stroke with catheterization in March. EMS adm 2 nitroglycerin  tablets and 234 mg of aspirin . He wears 2L [place].   BP-156/84, HR-102, 98% 2L [place]

## 2023-10-04 NOTE — Consult Note (Incomplete)
 Consultation Progress Note   Patient: Kirk [name] FMW:[id number] DOB: 1960-09-10 DOA: 10/04/2023 DOS: the patient was seen and examined on 10/04/2023 Primary service: No att. providers found  Brief hospital course: No notes on file  Assessment and Plan: No notes have been filed under this hospital service. Service: Hospitalist    {Tip this will not be part of the note when signed  DVT Prophylaxis  .,  (Optional):26781}   {TRH_Follow_Sign_off:26779}  Subjective: ***  Physical Exam: Vitals:   10/04/23 1850 10/04/23 1857 10/04/23 1909  BP: (!) 173/81    Pulse: (!) 103    Resp: 15    Temp:  (!) 100.8 F (38.2 C)   TempSrc:  Oral   SpO2: 99%  95%   *** Data Reviewed: {Tip this will not be part of the note when signed- Document your independent interpretation of telemetry tracing, EKG, lab, Radiology test or any other diagnostic tests. Add any new diagnostic test ordered today. (Optional):26781} {Results:26384}  Family Communication: ***  Time spent: *** minutes.  Author: Claretta CHRISTELLA Alderman, MD 10/04/2023 8:13 PM  For on call review [email or web address].

## 2023-10-04 NOTE — H&P (Signed)
 Cardiology Admission History and Physical   Patient ID: Kirk [name] MRN: [id number]; DOB: 1960/09/18   Admission date: 10/04/2023  PCP:  Leonarda Roxan [name], NP   [facility] Providers Cardiologist:  Vinie [name] Maxcy, MD       Chief Complaint: Chest pain, possible ST elevation MI  Patient Profile: Kirk [name] is a 63 y.o. 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  History of Present Illness: Mr. Hissong is a 63 year old male with cardiac history notable for severely reduced EF by cardiac PET in March 2025 with a EF of 35% (echo at the same time showed EF of 45 to 50%) with cardiac catheterization showing no significant CAD (similarly cardiac catheterization 2016 showed no significant CAD)  He had chest pain at the time associated with dialysis. He has mild baseline ST elevations likely related to LVH on his EKGs At baseline making it difficult to interpret. He states that he has been having worsening intermittent but now total chest pain for roughly 2 days. He was able to go through dialysis today (10/04/2023). However later in the evening when he had continued significant pain and dyspnea he decided to call 911. Upon EMS arrival EKG showed EKG with biphasic ST elevations in the septal leads more concerning that his previous EKGs had shown. Based on him having initially 10 out of 10 chest pain upon arrival then improved to 9 out of 10 and then 5 out of 10 after NTG, I discussed with the ER physician and we felt it prudent to simply exclude ischemic CAD as etiology for his chest pain with cardiac catheterization. Of note, prior to coming to the Cath Lab labs were drawn, and temperature showed a low-grade fever of 100.8. He will likely  be comanaged with internal medicine service and nephrology.   He said but made things worse today call abdomen call 911 was affected he could no longer breathe.  This may be complicated by the fact that he ran out of his oxygen , but he was noticing worsening of his chest pain as well.  Upon arrival of EMS his pain was 9 out of 10.  He had some nausea but no emesis.  Also noting some headache after nitroglycerin  and some left leg numbness.  No signs or symptoms of rapid irregular beats palpitations.  He denied any PND or orthopnea although he is a very poor historian with very hard of hearing and may not of further question correctly.  At aunt's   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)    Anxiety    Asthma    Asthma    moderate persistent   Carpal tunnel syndrome    Cellulitis and abscess of trunk    Cholelithiasis 07/13/2014   Chronic headaches    Cigarette smoker 07/11/2017   Stopped 2/022  - referred to start Lung cancer screening 04/2021         Debility, unspecified    Dermatophytosis of the body    Dysrhythmia    history of   Edema    End stage renal disease on dialysis (HCC)  MWF; [facility] in [place] (10/21/2014)   Essential hypertension, benign    Generalized anxiety disorder 05/25/2022   GERD (gastroesophageal reflux disease)    Gout, unspecified    History of renal transplant    HTN (hypertension)    Hypertrophy of prostate without urinary obstruction and other lower urinary tract symptoms (LUTS)    Hypotension, unspecified    Hypoxia 03/21/2023   Impotence of organic origin    Insomnia, unspecified    Localization-related (focal) (partial) epilepsy and epileptic syndromes with complex partial seizures, without mention of intractable epilepsy    12-15-19- Wife states he has NEVER had a seizure    Lumbago     Memory loss    OSA on CPAP    Other and unspecified hyperlipidemia    controlled /managed per wife    Other chronic nonalcoholic liver disease    Other malaise and fatigue    Other nonspecific abnormal serum enzyme levels    Pain in joint, lower leg    Pain in joint, upper arm    Pneumonia several times   PONV (postoperative nausea and vomiting)    Secondary hyperparathyroidism (of renal origin)    Shortness of breath    Sleep apnea    wears cpap    Tension headache    Unspecified constipation    Unspecified essential hypertension    Unspecified hereditary and idiopathic peripheral neuropathy    Unspecified vitamin D  deficiency    Past Surgical History:  Procedure Laterality Date   A/V FISTULAGRAM N/A 03/28/2023   Procedure: A/V Fistulagram;  Surgeon: Melia Lynwood ORN, MD;  Location: [facility] INVASIVE CV LAB;  Service: Cardiovascular;  Laterality: N/A;   AV FISTULA PLACEMENT Left ?2010   forearm; at [facility]   BACK SURGERY     CARDIAC CATHETERIZATION  03/21/2011   CHOLECYSTECTOMY N/A 10/21/2014   Procedure: LAPAROSCOPIC CHOLECYSTECTOMY WITH INTRAOPERATIVE CHOLANGIOGRAM;  Surgeon: Deward Null III, MD;  Location[facility] OR;  Service: General;  Laterality: N/A;   COLONOSCOPY     ESOPHAGOGASTRODUODENOSCOPY N/A 06/21/2023   Procedure: EGD (ESOPHAGOGASTRODUODENOSCOPY);  Surgeon: Legrand Victory LITTIE DOUGLAS, MD;  Location: [facility] ENDOSCOPY;  Service: Gastroenterology;  Laterality: N/A;   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   IR ANGIO INTRA EXTRACRAN SEL INTERNAL CAROTID UNI R MOD SED  11/30/2021   IR ANGIOGRAM FOLLOW UP STUDY  11/30/2021   IR AV DIALY SHUNT INTRO NEEDLE/INTRACATH INITIAL W/PTA/IMG LEFT  12/13/2022   IR BONE MARROW BIOPSY & ASPIRATION  03/27/2022   IR NEURO EACH ADD'L AFTER BASIC UNI RIGHT (MS)  11/30/2021   IR TRANSCATH/EMBOLIZ  11/30/2021   IR US  GUIDE VASC ACCESS LEFT  12/13/2022   KIDNEY TRANSPLANT  08/17/2011   [facility]    LAPAROSCOPIC CHOLECYSTECTOMY   10/21/2014   w/IOC   LEFT HEART CATH AND CORONARY ANGIOGRAPHY N/A 04/09/2023   Procedure: LEFT HEART CATH AND CORONARY ANGIOGRAPHY;  Surgeon: Anner Alm ORN, MD;  Location: [facility] INVASIVE CV LAB;  Service: Cardiovascular;  Laterality: N/A;   LEFT HEART CATHETERIZATION WITH CORONARY ANGIOGRAM N/A 03/21/2011   Procedure: LEFT HEART CATHETERIZATION WITH CORONARY ANGIOGRAM;  Surgeon: Vinie KYM Maxcy, MD;  Location: [facility] CATH LAB;  Service: Cardiovascular;  Laterality: N/A;   NEPHRECTOMY  08/2013   removed transplaned kidney   PERIPHERAL VASCULAR BALLOON ANGIOPLASTY Left 03/28/2023   Procedure: PERIPHERAL VASCULAR BALLOON ANGIOPLASTY;  Surgeon: Melia Lynwood ORN, MD;  Location: [facility] INVASIVE CV LAB;  Service: Cardiovascular;  Laterality: Left;  80%  outflow cephalic   POLYPECTOMY     POSTERIOR FUSION CERVICAL SPINE  06/25/2012   for spinal stenosis   RADIOLOGY WITH ANESTHESIA N/A 11/30/2021   Procedure: RIGHT MIDDLE MENINGEAL ARTERY EMBOLIZATION;  Surgeon: Radiologist, Medication, MD;  Location: [facility] OR;  Service: Radiology;  Laterality: N/A;   VASECTOMY  2010     Medications Prior to Admission: Prior to Admission medications   Medication Sig Start Date End Date Taking? Authorizing Provider  acetaminophen  (TYLENOL ) 500 MG tablet Take 1 tablet (500 mg total) by mouth every 6 (six) hours as needed for moderate pain. Patient taking differently: Take 1,000 mg by mouth every 6 (six) hours as needed for moderate pain (pain score 4-6) or headache. 09/12/14   Jadine Toribio SQUIBB, MD  albuterol  (PROVENTIL ) (2.5 MG/3ML) 0.083% nebulizer solution Take 3 mLs (2.5 mg total) by nebulization every 4 (four) hours as needed for wheezing or shortness of breath. 06/25/23   Elgergawy, Brayton RAMAN, MD  albuterol  (VENTOLIN  HFA) 108 (90 Base) MCG/ACT inhaler Inhale 2 puffs into the lungs every 6 (six) hours as needed for wheezing or shortness of breath. 10/29/22   Darlean Ozell NOVAK, MD  amLODipine  (NORVASC ) 10 MG tablet TAKE 1 TABLET BY MOUTH EVERY  DAY Patient taking differently: Take 10 mg by mouth See admin instructions. Take 1 tablet (10mg ) by mouth once daily in the afternoon. 12/05/22   Ngetich, Dinah C, NP  aspirin  EC 81 MG tablet Take 81 mg by mouth daily in the afternoon.    [provider]  Cholecalciferol  (VITAMIN D3) 25 MCG (1000 UT) CAPS Take 4,000 Units by mouth daily.    [provider]  doxazosin  (CARDURA ) 4 MG tablet Take 1 tablet (4 mg total) by mouth at bedtime. 05/31/23   Vannie Reche RAMAN, NP  DULoxetine  (CYMBALTA ) 60 MG capsule Take 60 mg by mouth in the morning.    [provider]  fluticasone -salmeterol (ADVAIR HFA) 45-21 MCG/ACT inhaler  07/05/23   Darlean Ozell NOVAK, MD  guaiFENesin  (MUCINEX ) 600 MG 12 hr tablet Take 1,200 mg by mouth 2 (two) times daily as needed for cough or to loosen phlegm.    [provider]  ipratropium-albuterol  (DUONEB) 0.5-2.5 (3) MG/3ML SOLN Take 3 mLs by nebulization every 6 (six) hours. Please take every 6 hours scheduled for next 5 days then as needed. 06/25/23   Elgergawy, Brayton RAMAN, MD  isosorbide  mononitrate (IMDUR ) 60 MG 24 hr tablet Take 60 mg by mouth daily. 10/04/22   [provider]  metoprolol  tartrate (LOPRESSOR ) 50 MG tablet Take 1 tablet (50 mg total) by mouth 2 (two) times daily. Patient taking differently: Take 50 mg by mouth See admin instructions. Take 1 tablet (50mg ) by mouth twice daily, in the afternoon and at bedtime. 05/31/23   Walker, Caitlin S, NP  multivitamin (RENA-VIT) TABS tablet Take 1 tablet by mouth daily. Patient taking differently: Take 1 tablet by mouth See admin instructions. Take 1 tablet by mouth once daily in the afternoon. 01/29/23   Ngetich, Roxan [name], NP  OXYGEN  Inhale into the lungs. 2 L  cont.    [provider]  pantoprazole  (PROTONIX ) 40 MG tablet Take 1 tablet (40 mg total) by mouth daily. 06/25/23   Elgergawy, Brayton RAMAN, MD  pravastatin  (PRAVACHOL ) 40 MG tablet TAKE 1 TABLET BY MOUTH EVERY DAY IN THE  EVENING Patient taking differently: Take 40 mg by mouth at bedtime. TAKE 1 TABLET BY MOUTH EVERY DAY IN THE EVENING 12/04/22   Ngetich, [employer]  C, NP  predniSONE  (STERAPRED UNI-PAK 21 TAB) 10 MG (21) TBPK tablet Use per package instruction 06/25/23   Elgergawy, Brayton RAMAN, MD  sevelamer  carbonate (RENVELA ) 2.4 g PACK Take 4.8 g by mouth 3 (three) times daily with meals.    [provider]  traMADol  (ULTRAM ) 50 MG tablet Take 50 mg by mouth daily as needed for severe pain (pain score 7-10).    [provider]  traZODone  (DESYREL ) 50 MG tablet TAKE 0.5-1 TABLET (25-50 MG TOTAL) BY MOUTH AT BEDTIME AS NEEDED. FOR SLEEP Patient taking differently: Take 50 mg by mouth at bedtime. 04/08/23   Ngetich, Dinah C, NP  UNABLE TO FIND Inhale 1 Device into the lungs at bedtime. CPAP    [provider]  VELPHORO  500 MG chewable tablet Chew 1,000-1,500 mg by mouth See admin instructions. Chew and swallow 3 tablets (1500mg ) by mouth three times daily with meals and take 2 tablets (1000mg ) with snacks. 02/06/23   [provider]     Allergies:    Allergies  Allergen Reactions   Codeine Nausea And Vomiting   Hydrocodone -Acetaminophen  Nausea And Vomiting    Social History:   Social History   Socioeconomic History   Marital status: Married    Spouse name: Edna   Number of children: 3   Years of education: Not on file   Highest education level: Not on file  Occupational History   Occupation: disabled    Employer: DISABLED  Tobacco Use   Smoking status: Former    Current packs/day: 0.00    Average packs/day: 0.5 packs/day for 32.0 years (16.0 ttl pk-yrs)    Types: Cigarettes    Start date: 71    Quit date: 02/24/2020    Years since quitting: 3.6   Smokeless tobacco: Never  Vaping Use   Vaping status: Never Used  Substance and Sexual Activity   Alcohol use: No    Alcohol/week: 0.0 standard drinks of alcohol   Drug use: No   Sexual activity: Yes  Other Topics Concern    Not on file  Social History Narrative   Drinks 1 cup of caffeine daily.   Social Drivers of Health   Financial Resource Strain: Medium Risk (11/02/2021)   Overall Financial Resource Strain (CARDIA)    Difficulty of Paying Living Expenses: Somewhat hard  Food Insecurity: No Food Insecurity (06/20/2023)   Hunger Vital Sign    Worried About Running Out of Food in the Last Year: Never true    Ran Out of Food in the Last Year: Never true  Transportation Needs: No Transportation Needs (06/20/2023)   PRAPARE - Administrator, Civil Service (Medical): No    Lack of Transportation (Non-Medical): No  Physical Activity: Insufficiently Active (06/25/2017)   Exercise Vital Sign    Days of Exercise per Week: 3 days    Minutes of Exercise per Session: 20 min  Stress: No Stress Concern Present (06/25/2017)   [employer] of Occupational Health - Occupational Stress Questionnaire    Feeling of Stress : Only a little  Social Connections: Patient Declined (03/17/2023)   Social Connection and Isolation Panel    Frequency of Communication with Friends and Family: Patient declined    Frequency of Social Gatherings with Friends and Family: Patient declined    Attends Religious Services: Patient declined    Database administrator or Organizations: Patient declined    Attends Banker Meetings: Patient declined    Marital Status: Patient declined  Intimate Partner Violence: Not At Risk (06/20/2023)   Humiliation, Afraid, Rape, and Kick questionnaire    Fear of Current or Ex-Partner: No    Emotionally Abused: No    Physically Abused: No    Sexually Abused: No     Family History:   The patient's family history is negative for Colon cancer, Esophageal cancer, Rectal cancer, Stomach cancer, and Colon polyps. He was adopted.    ROS:  Please see the history of present illness.  --> Upon arrival to the Cath Lab he did have some nausea but no emesis.  He had some left leg numbness  and headache after nitroglycerin .  He denied any melena, hematochezia hematuria open does not make urine); no coughing or wheezing.  No sensation of fevers or chills.  Times All other ROS reviewed and negative.     Physical Exam/Data: Vitals:   10/04/23 1850 10/04/23 1857 10/04/23 1909 10/04/23 2014  BP: (!) 173/81   125/61  Pulse: (!) 103   85  Resp: 15   16  Temp:  (!) 100.8 F (38.2 C)  99.8 F (37.7 C)  TempSrc:  Oral  Oral  SpO2: 99%  95% 93%  Weight:    84.1 kg   No intake or output data in the 24 hours ending 10/04/23 2035    10/04/2023    8:14 PM 07/03/2023    8:31 AM 06/24/2023    1:35 PM  Last 3 Weights  Weight (lbs) 185 lb 6.5 oz 182 lb 9.6 oz 187 lb 6.3 oz  Weight (kg) 84.1 kg 82.827 kg 85 kg     Body mass index is 29.04 kg/m.  General: Chronically ill-appearing.  Somewhat uncomfortable complaining of initially not out of 10 chest pain upon arrival to ER improving to 5 out of 10 after NTG  HEENT: No acute findings Neck: Diffuse bruit worse on the left than right from his fistula.  Unable to actually auscultate for other bruits. Vascular: No carotid bruits; Distal pulses 2+ bilaterally   Cardiac:  normal S1, S2; RRR; 2/6 SEM at RUSB.  Cannot exclude S4 gallop but no rubs Lungs: Diminished breath sounds at bases Abd: soft, nontender, no hepatomegaly  Ext: no clubbing/cyanosis or significant edema Musculoskeletal:  No deformities, BUE and BLE strength normal and equal Skin: warm and dry  Neuro:  CNs 2-12 intact, no focal abnormalities noted Psych:  Normal affect   EKG:  The ECG that was done by EMS and in ER was personally reviewed and demonstrates sinus rhythm/sinus tachycardia with rates in the high 90s to low 100s.  Findings consistent with LVH but also biphasic ST elevations in septal leads with more T wave inversions noted in limb leads than previous EKGs.  Could not exclude anterior STEMI  Relevant CV Studies: Cardiac Cath 04/09/2023: Angiographically minimal CAD  with normal LVEDP and 10 to 15 mmHg AV gradient.  TTE 03/18/2023: EF 45 to 50% with mildly decreased function and global hypokinesis.  GR 2 DD.  Moderately elevated PAP estimated 60 mmHg.  Moderate LA and mild RA dilation.  Moderate MAC with no MS.  Severe AoV calcification with mild to moderate AAS (mean AVG 18 mmHg.  IVC dilated greater than 50%-suggest RAP 8 mmHg. NM PET/CT Cardiac Perfusion: LVEF moderately reduced over 35% at rest and 37% at stress.  Severe coronary calcification noted.  Otherwise no high risk findings to suggest TID or drop in EF with stress.--INTERMEDIATE RISK.  No evidence of ischemia or infarction.  Abnormal  myocardial blood flow.  Laboratory Data: High Sensitivity Troponin:   Recent Labs  Lab 10/04/23 1855  TROPONINIHS 71*      Chemistry Recent Labs  Lab 10/04/23 1855 10/04/23 1924 10/04/23 1925  NA 136 136 137  K 3.0* 3.0* 3.1*  CL 93* 92*  --   CO2 30  --   --   GLUCOSE 93 98  --   BUN 10 11  --   CREATININE 5.33* 5.40*  --   CALCIUM  8.3*  --   --   GFRNONAA 11*  --   --   ANIONGAP 13  --   --     Recent Labs  Lab 10/04/23 1855  PROT 7.0  ALBUMIN  3.3*  AST 15  ALT 10  ALKPHOS 65  BILITOT 0.9   Lipids No results for input(s): CHOL, TRIG, HDL, LABVLDL, LDLCALC, CHOLHDL in the last 168 hours. Hematology Recent Labs  Lab 10/04/23 1855 10/04/23 1924 10/04/23 1925  WBC 3.2*  --   --   RBC 3.09*  --   --   HGB 9.4* 9.2* 9.5*  HCT 29.6* 27.0* 28.0*  MCV 95.8  --   --   MCH 30.4  --   --   MCHC 31.8  --   --   RDW 15.5  --   --   PLT 78*  --   --    Thyroid  No results for input(s): TSH, FREET4 in the last 168 hours. BNPNo results for input(s): BNP, PROBNP in the last 168 hours.  DDimer No results for input(s): DDIMER in the last 168 hours.  Radiology/Studies:  CARDIAC CATHETERIZATION Result Date: 10/04/2023 Table formatting from the original result was not included. Images from the original result were not  included. Dominance: Right                                                                                LV Gram (2 - Hypokinesis); EF ~35-40%            Angiographically minimal CAD with no change from prior catheterization. Moderate-severely reduced LVEF (estimated 30 to 35% with global hypokinesis) with EDP of 16 to 18 mmHg. => Nonischemic Cardiomyopathy as previously indicated  RECOMMENDATIONS   Anticipated discharge date to be determined.   The patient will be followed by cardiology but will likely need to be comanaged by nephrology and internal medicine. Will need aggressive blood pressure management.   No indication for antiplatelet therapy at this time . Alm Clay, MD    Assessment and Plan: Initial diagnosis was possible Acute Coronary Syndrome with abnormal EKG concerning for possible anterior STEMI-no obstructive CAD by cardiac catheterization would suggest symptoms more related to Acute on Chronic Combined Systolic and Diastolic Heart Failure Related to Accelerated Hypertension He was taken to the heart catheterization lab for concerning EKG and ongoing chest pain with concerns that this could have been an anterior STEMI.  Thankfully images did not show any obstructive CAD. We would not use IV heparin  or other standard ACS medications based on the results of the cardiac catheterization. Continue aspirin  81mg  daily, pravastatin  40 mg daily, and beta-blocker (Lopressor  50 mg twice daily). Question the benefit  of Imdur  based on lack of macrovascular CAD, could consider titration of blood pressure to higher dose if heart rate will tolerate.  ESRD on HD-did have dialysis today Uncontrolled hypertension on arrival notably improved after 2 doses of 10 mg IV labetalol .  Will restart home medications and titrate medications up as necessary based on blood pressure in the hospital. Consult nephrology and [facility] hospitalist for assistance with management of dialysis patient but also for his  low-grade fever.  Volume control being per dialysis as he does not make urine. Distant history of PAF but no recurrent symptoms at this point.  Monitor on telemetry  ,   Risk Assessment/Risk Scores:   TIMI Risk Score for Unstable Angina or Non-ST Elevation MI: Initial suggestion was that it was a STEMI but cardiac catheterization confirmed that was not STEMI. The patient's TIMI risk score is 4, which indicates a 20% risk of all cause mortality, new or recurrent myocardial infarction or need for urgent revascularization in the next 14 days.  New York  Heart Association (NYHA) Functional Class NYHA Class III  CHA2DS2-VASc Score = 3   This indicates a 3.2% annual risk of stroke. The patient's score is based upon: CHF History: 1 HTN History: 1 Diabetes History: 0 Stroke History: 0 Vascular Disease History: 1 Age Score: 0 Gender Score: 0 Patient has not had any recurrent episodes of A-fib and therefore has not been placed on DOAC.  Will monitor to see if he has any recurrence in the hospital before consideration of DOAC.  Chads Vascor would be elevated.  Code Status: Full Code  Severity of Illness: The appropriate patient status for this patient is OBSERVATION. Observation status is judged to be reasonable and necessary in order to provide the required intensity of service to ensure the patient's safety. The patient's presenting symptoms, physical exam findings, and initial radiographic and laboratory data in the context of their medical condition is felt to place them at decreased risk for further clinical deterioration. Furthermore, it is anticipated that the patient will be medically stable for discharge from the hospital within 2 midnights of admission.   For questions or updates, please contact [facility] Please consult [email or web address] for contact info under       Signed, Alm Clay, MD  10/04/2023 8:35 PM

## 2023-10-04 NOTE — ED Notes (Signed)
 PT PLACED ON ZOLL

## 2023-10-05 ENCOUNTER — Observation Stay (HOSPITAL_COMMUNITY)

## 2023-10-05 ENCOUNTER — Encounter (HOSPITAL_COMMUNITY): Payer: Self-pay | Admitting: Cardiology

## 2023-10-05 ENCOUNTER — Other Ambulatory Visit: Payer: Self-pay

## 2023-10-05 DIAGNOSIS — B348 Other viral infections of unspecified site: Secondary | ICD-10-CM

## 2023-10-05 DIAGNOSIS — D61818 Other pancytopenia: Secondary | ICD-10-CM

## 2023-10-05 DIAGNOSIS — R509 Fever, unspecified: Secondary | ICD-10-CM

## 2023-10-05 DIAGNOSIS — I120 Hypertensive chronic kidney disease with stage 5 chronic kidney disease or end stage renal disease: Secondary | ICD-10-CM | POA: Diagnosis not present

## 2023-10-05 DIAGNOSIS — I129 Hypertensive chronic kidney disease with stage 1 through stage 4 chronic kidney disease, or unspecified chronic kidney disease: Secondary | ICD-10-CM

## 2023-10-05 DIAGNOSIS — I5030 Unspecified diastolic (congestive) heart failure: Secondary | ICD-10-CM

## 2023-10-05 DIAGNOSIS — I110 Hypertensive heart disease with heart failure: Secondary | ICD-10-CM | POA: Diagnosis not present

## 2023-10-05 DIAGNOSIS — I428 Other cardiomyopathies: Secondary | ICD-10-CM | POA: Diagnosis not present

## 2023-10-05 DIAGNOSIS — N186 End stage renal disease: Secondary | ICD-10-CM | POA: Diagnosis not present

## 2023-10-05 DIAGNOSIS — R072 Precordial pain: Principal | ICD-10-CM

## 2023-10-05 DIAGNOSIS — Z992 Dependence on renal dialysis: Secondary | ICD-10-CM | POA: Diagnosis not present

## 2023-10-05 HISTORY — DX: Other viral infections of unspecified site: B34.8

## 2023-10-05 LAB — RESPIRATORY PANEL BY PCR

## 2023-10-05 LAB — ECHOCARDIOGRAM COMPLETE
AR max vel: 1.11 cm2
AV Area VTI: 1.04 cm2
AV Area mean vel: 1.16 cm2
AV Mean grad: 22 mmHg
AV Peak grad: 38.7 mmHg
Ao pk vel: 3.11 m/s
Area-P 1/2: 4.49 cm2
S' Lateral: 4.3 cm
Weight: 2966.51 [oz_av]

## 2023-10-05 LAB — RENAL FUNCTION PANEL
Albumin: 2.9 g/dL — ABNORMAL LOW (ref 3.5–5.0)
Anion gap: 14 (ref 5–15)
BUN: 16 mg/dL (ref 8–23)
CO2: 30 mmol/L (ref 22–32)
Calcium: 7.9 mg/dL — ABNORMAL LOW (ref 8.9–10.3)
Chloride: 92 mmol/L — ABNORMAL LOW (ref 98–111)
Creatinine, Ser: 6.78 mg/dL — ABNORMAL HIGH (ref 0.61–1.24)
GFR, Estimated: 9 mL/min — ABNORMAL LOW (ref >60)
Glucose, Bld: 112 mg/dL — ABNORMAL HIGH (ref 70–99)
Phosphorus: 4.8 mg/dL — ABNORMAL HIGH (ref 2.5–4.6)
Potassium: 3.6 mmol/L (ref 3.5–5.1)
Sodium: 136 mmol/L (ref 135–145)

## 2023-10-05 LAB — CBC
HCT: 24 % — ABNORMAL LOW (ref 39.0–52.0)
Hemoglobin: 7.8 g/dL — ABNORMAL LOW (ref 13.0–17.0)
MCH: 31 pg (ref 26.0–34.0)
MCHC: 32.5 g/dL (ref 30.0–36.0)
MCV: 95.2 fL (ref 80.0–100.0)
Platelets: 78 K/uL — ABNORMAL LOW (ref 150–400)
RBC: 2.52 MIL/uL — ABNORMAL LOW (ref 4.22–5.81)
RDW: 15.5 % (ref 11.5–15.5)
WBC: 2.3 K/uL — ABNORMAL LOW (ref 4.0–10.5)
nRBC: 0 % (ref 0.0–0.2)

## 2023-10-05 LAB — MAGNESIUM: Magnesium: 1.8 mg/dL (ref 1.7–2.4)

## 2023-10-05 LAB — RESP PANEL BY RT-PCR (RSV, FLU A&B, COVID)  RVPGX2
Influenza A by PCR: NEGATIVE
Influenza B by PCR: NEGATIVE
Resp Syncytial Virus by PCR: NEGATIVE
SARS Coronavirus 2 by RT PCR: NEGATIVE

## 2023-10-05 LAB — MRSA NEXT GEN BY PCR, NASAL: MRSA by PCR Next Gen: NOT DETECTED

## 2023-10-05 MED ORDER — POTASSIUM CHLORIDE CRYS ER 20 MEQ PO TBCR
40.0000 meq | EXTENDED_RELEASE_TABLET | Freq: Once | ORAL | Status: AC
  Administered 2023-10-05: 40 meq via ORAL
  Filled 2023-10-05: qty 2

## 2023-10-05 MED ORDER — INFLUENZA VIRUS VACC SPLIT PF (FLUZONE) 0.5 ML IM SUSY
0.5000 mL | PREFILLED_SYRINGE | INTRAMUSCULAR | Status: AC
  Administered 2023-10-07: 0.5 mL via INTRAMUSCULAR

## 2023-10-05 MED ORDER — ORAL CARE MOUTH RINSE: 15.0000 mL | OROMUCOSAL | Status: DC | PRN

## 2023-10-05 MED ORDER — CHLORHEXIDINE GLUCONATE CLOTH 2 % EX PADS
6.0000 | MEDICATED_PAD | Freq: Every day | CUTANEOUS | Status: DC
  Administered 2023-10-05 – 2023-10-07 (×3): 6 via TOPICAL

## 2023-10-05 MED ORDER — SODIUM CHLORIDE 0.9 % IV SOLN
500.0000 mg | INTRAVENOUS | Status: AC
  Administered 2023-10-05 – 2023-10-07 (×3): 500 mg via INTRAVENOUS
  Filled 2023-10-05 (×3): qty 5

## 2023-10-05 MED ORDER — SODIUM CHLORIDE 0.9 % IV SOLN
2.0000 g | INTRAVENOUS | Status: DC
  Administered 2023-10-05 – 2023-10-07 (×3): 2 g via INTRAVENOUS
  Filled 2023-10-05 (×4): qty 20

## 2023-10-05 NOTE — Progress Notes (Signed)
 Echocardiogram 2D Echocardiogram has been performed.  Kirk [name] 10/05/2023, 4:01 PM

## 2023-10-05 NOTE — Consult Note (Signed)
 [facility] Renal Consultation Note    Indication for Consultation:  Management of ESRD/hemodialysis; anemia, hypertension/volume and secondary hyperparathyroidism  ERE:Whzupry, Dinah C, NP  HPI: Kirk [name] is a 63 y.o. male. ESRD on HD MWF at [facility].  Past medical history significant for HTN, combined systolic and diastolic heart failure, gout, HLD, OSA on CPAP, anemia of CKD and secondary hyperparathyroidism.   Patient seen and examined at bedside.  Presented to the ED yesterday due to chest pain.  Reports pain 10/10 yesterday and jumps back up close to that with movement today.  Admits to nausea, chills and dysuria.  States he was originally short of breath because his home O2 is not working.  Usually on 3L O2 at home. Breathing better now.  Denies abdominal pain, vomiting, diarrhea, weakness, dizziness and fatigue.   Pertinent findings during work up include EKG suspicious for anterior MI, coronary angiography with minimal CAD and normal LVEDP.  Patient admitted for further evaluation and management.   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)    Anxiety    Asthma    Asthma    moderate persistent   Carpal tunnel syndrome    Cellulitis and abscess of trunk    Cholelithiasis 07/13/2014   Chronic headaches    Cigarette smoker 07/11/2017   Stopped 2/022  - referred to start Lung cancer screening 04/2021         Debility, unspecified    Dermatophytosis of the body    Dysrhythmia    history of   Edema    End stage renal disease on dialysis (HCC)    MWF; [facility] in [place] (10/21/2014)   Essential hypertension, benign    Generalized anxiety disorder 05/25/2022   GERD (gastroesophageal reflux disease)    Gout, unspecified    History of renal transplant    HTN (hypertension)     Hypertrophy of prostate without urinary obstruction and other lower urinary tract symptoms (LUTS)    Hypotension, unspecified    Hypoxia 03/21/2023   Impotence of organic origin    Insomnia, unspecified    Localization-related (focal) (partial) epilepsy and epileptic syndromes with complex partial seizures, without mention of intractable epilepsy    12-15-19- Wife states he has NEVER had a seizure    Lumbago    Memory loss    OSA on CPAP    Other and unspecified hyperlipidemia    controlled /managed per wife    Other chronic nonalcoholic liver disease    Other malaise and fatigue    Other nonspecific abnormal serum enzyme levels    Pain in joint, lower leg    Pain in joint, upper arm    Pneumonia several times   PONV (postoperative nausea and vomiting)    Secondary hyperparathyroidism (of renal origin)    Shortness of breath    Sleep apnea    wears cpap    Tension headache    Unspecified constipation    Unspecified essential hypertension    Unspecified hereditary and idiopathic peripheral neuropathy    Unspecified vitamin D  deficiency    Past Surgical History:  Procedure Laterality Date   A/V FISTULAGRAM N/A 03/28/2023   Procedure: A/V Fistulagram;  Surgeon: Melia Lynwood ORN, MD;  [facility]: [facility];  Service: Cardiovascular;  Laterality: N/A;  AV FISTULA PLACEMENT Left ?2010   forearm; at [facility]   BACK SURGERY     CARDIAC CATHETERIZATION  03/21/2011   CHOLECYSTECTOMY N/A 10/21/2014   Procedure: LAPAROSCOPIC CHOLECYSTECTOMY WITH INTRAOPERATIVE CHOLANGIOGRAM;  Surgeon: Deward Null III, MD;  Location: [facility] OR;  Service: General;  Laterality: N/A;   COLONOSCOPY     ESOPHAGOGASTRODUODENOSCOPY N/A 06/21/2023   Procedure: EGD (ESOPHAGOGASTRODUODENOSCOPY);  Surgeon: Legrand Victory LITTIE DOUGLAS, MD;  Location: [facility] ENDOSCOPY;  Service: Gastroenterology;  Laterality: N/A;   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   IR ANGIO INTRA EXTRACRAN SEL INTERNAL CAROTID UNI  R MOD SED  11/30/2021   IR ANGIOGRAM FOLLOW UP STUDY  11/30/2021   IR AV DIALY SHUNT INTRO NEEDLE/INTRACATH INITIAL W/PTA/IMG LEFT  12/13/2022   IR BONE MARROW BIOPSY & ASPIRATION  03/27/2022   IR NEURO EACH ADD'L AFTER BASIC UNI RIGHT (MS)  11/30/2021   IR TRANSCATH/EMBOLIZ  11/30/2021   IR US  GUIDE VASC ACCESS LEFT  12/13/2022   KIDNEY TRANSPLANT  08/17/2011   [facility]    LAPAROSCOPIC CHOLECYSTECTOMY  10/21/2014   w/IOC   LEFT HEART CATH AND CORONARY ANGIOGRAPHY N/A 04/09/2023   Procedure: LEFT HEART CATH AND CORONARY ANGIOGRAPHY;  Surgeon: Anner Alm ORN, MD;  [facility];  Service: Cardiovascular;  Laterality: N/A;   LEFT HEART CATHETERIZATION WITH CORONARY ANGIOGRAM N/A 03/21/2011   Procedure: LEFT HEART CATHETERIZATION WITH CORONARY ANGIOGRAM;  Surgeon: Vinie KYM Maxcy, MD;  Location: [facility] CATH LAB;  Service: Cardiovascular;  Laterality: N/A;   NEPHRECTOMY  08/2013   removed transplaned kidney   PERIPHERAL VASCULAR BALLOON ANGIOPLASTY Left 03/28/2023   Procedure: PERIPHERAL VASCULAR BALLOON ANGIOPLASTY;  Surgeon: Melia Lynwood ORN, MD;  [facility]: [facility];  Service: Cardiovascular;  Laterality: Left;  80% outflow cephalic   POLYPECTOMY     POSTERIOR FUSION CERVICAL SPINE  06/25/2012   for spinal stenosis   RADIOLOGY WITH ANESTHESIA N/A 11/30/2021   Procedure: RIGHT MIDDLE MENINGEAL ARTERY EMBOLIZATION;  Surgeon: Radiologist, Medication, MD;  Location: [facility] OR;  Service: Radiology;  Laterality: N/A;   VASECTOMY  2010   Family History  Adopted: Yes  Problem Relation Age of Onset   Colon cancer Neg Hx    Esophageal cancer Neg Hx    Rectal cancer Neg Hx    Stomach cancer Neg Hx    Colon polyps Neg Hx    Social History:  reports that he quit smoking about 3 years ago. His smoking use included cigarettes. He started smoking about 32 years ago. He has a 16 pack-year smoking history. He has never used smokeless tobacco. He reports that he does not drink alcohol and does  not use drugs. Allergies  Allergen Reactions   Codeine Nausea And Vomiting   Hydrocodone -Acetaminophen  Nausea And Vomiting   Prior to Admission medications   Medication Sig Start Date End Date Taking? Authorizing Provider  acetaminophen  (TYLENOL ) 500 MG tablet Take 1 tablet (500 mg total) by mouth every 6 (six) hours as needed for moderate pain. Patient taking differently: Take 1,000 mg by mouth every 6 (six) hours as needed for moderate pain (pain score 4-6) or headache. 09/12/14  Yes Jadine Toribio SQUIBB, MD  albuterol  (PROVENTIL ) (2.5 MG/3ML) 0.083% nebulizer solution Take 3 mLs (2.5 mg total) by nebulization every 4 (four) hours as needed for wheezing or shortness of breath. 06/25/23  Yes Elgergawy, Brayton RAMAN, MD  amLODipine  (NORVASC ) 10 MG tablet TAKE 1 TABLET BY MOUTH EVERY DAY  Patient taking differently: Take 10 mg by mouth See admin instructions. Take 1 tablet (10mg ) by mouth once daily in the afternoon. 12/05/22  Yes Ngetich, Dinah C, NP  aspirin  EC 81 MG tablet Take 81 mg by mouth daily in the afternoon.   Yes [provider]  Cholecalciferol  (VITAMIN D3) 25 MCG (1000 UT) CAPS Take 4,000 Units by mouth See admin instructions. Patient takes Earlean Everts, Sat, Sun   Yes [provider]  doxazosin  (CARDURA ) 4 MG tablet Take 1 tablet (4 mg total) by mouth at bedtime. 05/31/23  Yes Vannie Reche RAMAN, NP  fluticasone -salmeterol (ADVAIR HFA) 45-21 MCG/ACT inhaler  07/05/23  Yes Wert, Michael B, MD  hydrALAZINE  (APRESOLINE ) 50 MG tablet Take 25 mg by mouth 3 (three) times daily.   Yes [provider]  isosorbide  mononitrate (IMDUR ) 60 MG 24 hr tablet Take 60 mg by mouth daily. 10/04/22  Yes [provider]  metoprolol  tartrate (LOPRESSOR ) 50 MG tablet Take 1 tablet (50 mg total) by mouth 2 (two) times daily. Patient taking differently: Take 50 mg by mouth See admin instructions. Take 1 tablet (50mg ) by mouth twice daily, in the afternoon and at bedtime. 05/31/23  Yes  Vannie Reche RAMAN, NP  multivitamin (RENA-VIT) TABS tablet Take 1 tablet by mouth daily. Patient taking differently: Take 1 tablet by mouth See admin instructions. Take 1 tablet by mouth once daily in the afternoon. 01/29/23  Yes Ngetich, Dinah C, NP  OXYGEN  Inhale into the lungs. 2 L  cont.   Yes [provider]  sevelamer  carbonate (RENVELA ) 2.4 g PACK Take 4.8 g by mouth 3 (three) times daily with meals. Also takes with his snacks   Yes [provider]  sucroferric oxyhydroxide (VELPHORO ) 500 MG chewable tablet Chew 1,000-1,500 mg by mouth See admin instructions. Take 3 tablets (1500mg ) by mouth three times daily with meals and take 2 tablets (1000mg ) with snacks.   Yes [provider]  traMADol  (ULTRAM ) 50 MG tablet Take 50 mg by mouth daily as needed for severe pain (pain score 7-10).   Yes [provider]  traZODone  (DESYREL ) 50 MG tablet TAKE 0.5-1 TABLET (25-50 MG TOTAL) BY MOUTH AT BEDTIME AS NEEDED. FOR SLEEP Patient taking differently: Take 50 mg by mouth at bedtime. 04/08/23  Yes Ngetich, Dinah C, NP  UNABLE TO FIND Inhale 1 Device into the lungs at bedtime. CPAP   Yes [provider]  albuterol  (VENTOLIN  HFA) 108 (90 Base) MCG/ACT inhaler Inhale 2 puffs into the lungs every 6 (six) hours as needed for wheezing or shortness of breath. 10/29/22   Darlean Ozell NOVAK, MD  DULoxetine  (CYMBALTA ) 60 MG capsule Take 60 mg by mouth in the morning. Patient not taking: Reported on 10/05/2023    [provider]  guaiFENesin  (MUCINEX ) 600 MG 12 hr tablet Take 1,200 mg by mouth 2 (two) times daily as needed for cough or to loosen phlegm.    [provider]  ipratropium-albuterol  (DUONEB) 0.5-2.5 (3) MG/3ML SOLN Take 3 mLs by nebulization every 6 (six) hours. Please take every 6 hours scheduled for next 5 days then as needed. 06/25/23   Elgergawy, Brayton RAMAN, MD  pantoprazole  (PROTONIX ) 40 MG tablet Take 1 tablet (40 mg total) by mouth daily. Patient  not taking: Reported on 10/05/2023 06/25/23   Elgergawy, Brayton RAMAN, MD  pravastatin  (PRAVACHOL ) 40 MG tablet TAKE 1 TABLET BY MOUTH EVERY DAY IN THE EVENING Patient taking differently: Take 40 mg by mouth at bedtime. TAKE  1 TABLET BY MOUTH EVERY DAY IN THE EVENING 12/04/22   Ngetich, Dinah C, NP  predniSONE  (STERAPRED UNI-PAK 21 TAB) 10 MG (21) TBPK tablet Use per package instruction Patient not taking: Reported on 10/05/2023 06/25/23   Elgergawy, Brayton RAMAN, MD   Current Facility-Administered Medications  Medication Dose Route Frequency Provider Last Rate Last Admin   0.9 %  sodium chloride  infusion  250 mL Intravenous PRN Anner Alm ORN, MD       acetaminophen  (TYLENOL ) tablet 650 mg  650 mg Oral Q4H PRN Anner Alm ORN, MD   650 mg at 10/05/23 0423   albuterol  (PROVENTIL ) (2.5 MG/3ML) 0.083% nebulizer solution 2.5 mg  2.5 mg Nebulization Q4H PRN Anner Alm ORN, MD       amLODipine  (NORVASC ) tablet 10 mg  10 mg Oral Daily Anner Alm ORN, MD   10 mg at 10/05/23 0831   aspirin  EC tablet 81 mg  81 mg Oral Q1500 Anner Alm ORN, MD   81 mg at 10/05/23 1344   azithromycin  (ZITHROMAX ) 500 mg in sodium chloride  0.9 % 250 mL IVPB  500 mg Intravenous Q24H Pahwani, Ravi, MD 250 mL/hr at 10/05/23 1043 500 mg at 10/05/23 1043   cefTRIAXone  (ROCEPHIN ) 2 g in sodium chloride  0.9 % 100 mL IVPB  2 g Intravenous Q24H Pahwani, Ravi, MD 200 mL/hr at 10/05/23 0846 2 g at 10/05/23 0846   doxazosin  (CARDURA ) tablet 4 mg  4 mg Oral QHS Anner Alm ORN, MD   4 mg at 10/04/23 2230   fluticasone  furoate-vilanterol (BREO ELLIPTA ) 100-25 MCG/ACT 1 puff  1 puff Inhalation Daily Anner Alm ORN, MD   1 puff at 10/05/23 0803   guaiFENesin  (MUCINEX ) 12 hr tablet 1,200 mg  1,200 mg Oral BID PRN Anner Alm ORN, MD   1,200 mg at 10/05/23 0840   heparin  injection 5,000 Units  5,000 Units Subcutaneous Q8H Anner Alm ORN, MD   5,000 Units at 10/05/23 1344   ipratropium-albuterol  (DUONEB) 0.5-2.5 (3) MG/3ML nebulizer solution 3  mL  3 mL Nebulization Q6H PRN Anner Alm ORN, MD       isosorbide  mononitrate (IMDUR ) 24 hr tablet 60 mg  60 mg Oral Daily Anner Alm ORN, MD       metoprolol  tartrate (LOPRESSOR ) tablet 50 mg  50 mg Oral BID Anner Alm ORN, MD   50 mg at 10/05/23 0831   multivitamin (RENA-VIT) tablet 1 tablet  1 tablet Oral Daily Anner Alm ORN, MD   1 tablet at 10/05/23 0831   ondansetron  (ZOFRAN ) injection 4 mg  4 mg Intravenous Q6H PRN Anner Alm ORN, MD   4 mg at 10/04/23 2211   pantoprazole  (PROTONIX ) EC tablet 40 mg  40 mg Oral Daily Anner Alm ORN, MD   40 mg at 10/05/23 9167   pravastatin  (PRAVACHOL ) tablet 40 mg  40 mg Oral QHS Anner Alm ORN, MD   40 mg at 10/04/23 2212   sevelamer  carbonate (RENVELA ) powder PACK 4.8 g  4.8 g Oral TID WC Anner Alm ORN, MD   4.8 g at 10/05/23 1345   sodium chloride  flush (NS) 0.9 % injection 3 mL  3 mL Intravenous Q12H Anner Alm ORN, MD   3 mL at 10/04/23 2212   sodium chloride  flush (NS) 0.9 % injection 3 mL  3 mL Intravenous PRN Anner Alm ORN, MD       traMADol  (ULTRAM ) tablet 50 mg  50 mg Oral Daily PRN Anner Alm ORN, MD   50  mg at 10/05/23 0423   traZODone  (DESYREL ) tablet 50 mg  50 mg Oral QHS Anner Alm ORN, MD   50 mg at 10/04/23 2212   Facility-Administered Medications Ordered in Other Encounters  Medication Dose Route Frequency Provider Last Rate Last Admin   sodium chloride  flush (NS) 0.9 % injection 10 mL  10 mL Intravenous Q12H Christine Males, PA-C       Labs: Basic Metabolic Panel: Recent Labs  Lab 10/04/23 1855 10/04/23 1924 10/04/23 1925 10/05/23 0447  NA 136 136 137 136  K 3.0* 3.0* 3.1* 3.6  CL 93* 92*  --  92*  CO2 30  --   --  30  GLUCOSE 93 98  --  112*  BUN 10 11  --  16  CREATININE 5.33* 5.40*  --  6.78*  CALCIUM  8.3*  --   --  7.9*  PHOS  --   --   --  4.8*   Liver Function Tests: Recent Labs  Lab 10/04/23 1855 10/05/23 0447  AST 15  --   ALT 10  --   ALKPHOS 65  --   BILITOT 0.9  --   PROT 7.0   --   ALBUMIN  3.3* 2.9*   CBC: Recent Labs  Lab 10/04/23 1855 10/04/23 1924 10/04/23 1925 10/04/23 2049 10/05/23 0447  WBC 3.2*  --   --  3.0* 2.3*  NEUTROABS 2.4  --   --   --   --   HGB 9.4*   < > 9.5* 8.2* 7.8*  HCT 29.6*   < > 28.0* 25.4* 24.0*  MCV 95.8  --   --  95.1 95.2  PLT 78*  --   --  76* 78*   < > = values in this interval not displayed.    Studies/Results: DG Chest Port 1 View Result Date: 10/04/2023 CLINICAL DATA:  Shortness of breath EXAM: PORTABLE CHEST 1 VIEW COMPARISON:  06/19/2023, 03/18/2023 FINDINGS: Hardware in the cervical spine. Cardiomegaly. Aortic atherosclerosis. Cardiomegaly with vascular congestion and diffuse interstitial and ground-glass opacity, slightly progressive, favor pulmonary edema but atypical infection could also produce this appearance IMPRESSION: Cardiomegaly with vascular congestion and diffuse interstitial and ground-glass opacity, slightly progressive, favor pulmonary edema but atypical infection could also produce this appearance. Electronically Signed   By: Luke Bun M.D.   On: 10/04/2023 21:05   CARDIAC CATHETERIZATION Result Date: 10/04/2023 Table formatting from the original result was not included. Images from the original result were not included. Dominance: Right                                                                                LV Gram (2 - Hypokinesis); EF ~35-40%            Angiographically minimal CAD with no change from prior catheterization. Moderate-severely reduced LVEF (estimated 30 to 35% with global hypokinesis) with EDP of 16 to 18 mmHg. => Nonischemic Cardiomyopathy as previously indicated  RECOMMENDATIONS   Anticipated discharge date to be determined.   The patient will be followed by cardiology but will likely need to be comanaged by nephrology and internal medicine. Will need aggressive blood pressure management.   No indication for  antiplatelet therapy at this time . Alm Clay, MD   ROS: All others  negative except those listed in HPI.  Physical Exam: Vitals:   10/05/23 0500 10/05/23 0729 10/05/23 0803 10/05/23 1240  BP:  128/62  100/60  Pulse: 97 86  69  Resp: (!) 21 (!) 24  (!) 21  Temp: 99 F (37.2 C) 98.8 F (37.1 C)  98.4 F (36.9 C)  TempSrc: Oral Oral  Oral  SpO2:  92% 93% 95%  Weight:         General: chronically ill appearing male in NAD Head: NCAT sclera not icteric MMM Neck: Supple. No lymphadenopathy Lungs: +wheezing on Left, nml WOB on 2L O2 Heart: RRR. No murmur, rubs or gallops.  Abdomen: soft, nontender, +BS, no guarding, no rebound tenderness M/S:  Equal strength b/l in upper and lower extremities.  Lower extremities:no edema, ischemic changes, or open wounds  Neuro: AAOx3. Moves all extremities spontaneously. Psych:  Responds to questions appropriately with a normal affect. Dialysis Access: LU AVF  Dialysis Orders:  MWF - GO  4.25hrs, BFR 400, DFR 700,  EDW 84kg, 2K/ 2Ca  Access: AVF  Heparin  2500 unit bolus Mircera 150 mcg q2wks - last 9/10 Venofer  50mg  qwk Calcitriol  2.25mcg qHD Sensipar  120mg  qHD  Assessment/Plan:  Chest pain - heart cath with minimal CAD and normal LVEDP  ESRD -  on MWF.  Next HD 10/07/23  Hypertension/volume  - Blood pressure in goal. Cardiology adjusting medications. Currently on amlodipine  10mg  every day, Imdur  60mg  every day and lopressor  50mg  BID CXR yesterday with possible pulmonary edema.  Does not appear volume overloaded.  Monitor closely, may need HD prior to Monday.   Anemia of CKD - Hgb 7.8. ESA recently dosed.   Secondary Hyperparathyroidism -  Corrected Ca and phos in goal.  Continue VDRA, sensipar  and binders.   Nutrition - Renal diet w/fluid restrictions.   Manuelita Labella, PA-C [facility] 10/05/2023, 2:38 PM

## 2023-10-05 NOTE — Care Management Obs Status (Signed)
 MEDICARE OBSERVATION STATUS NOTIFICATION   Patient Details  Name: Kirk [name] MRN: [id number] Date of Birth: 10/23/1960   Medicare Observation Status Notification Given:  Yes    Jon Cruel 10/05/2023, 2:20 PM

## 2023-10-05 NOTE — Plan of Care (Addendum)
 This patient remains on [facility] as of time of writing. The patient is AA+Ox4 but HOH. Supplemental O2 at 2 L/min via [place]. Cardiac monitoring in place. AVF to LUE (save left arm); FYI flag placed on patient's chart and restricted extremity arm band placed on the patient's LUE`. CHG bath ordered and completed overnight per protocol. Patient's admission profile completed overnight by this RN. Flu vaccine ordered per BPA. This RN reached out to RT to set-up CPAP for patient; patient states he did not receive CPAP therapy overnight last night but would like to for tonight. Adult oral care protocol initiated overnight by this RN also. Fall prevention measures initiated overnight including placing a yellow armband on the patient.  Edit: 0140 hours: Patient expressing frustration RE CPAP, still waiting for set-up. 4th call by this RN placed to RT at this time for CPAP set-up.  Problem: Education: Goal: Knowledge of General Education information will improve Description: Including pain rating scale, medication(s)/side effects and non-pharmacologic comfort measures Outcome: Progressing   Problem: Health Behavior/Discharge Planning: Goal: Ability to manage health-related needs will improve Outcome: Progressing   Problem: Clinical Measurements: Goal: Ability to maintain clinical measurements within normal limits will improve Outcome: Progressing Goal: Will remain free from infection Outcome: Progressing Goal: Diagnostic test results will improve Outcome: Progressing Goal: Respiratory complications will improve Outcome: Progressing Goal: Cardiovascular complication will be avoided Outcome: Progressing   Problem: Activity: Goal: Risk for activity intolerance will decrease Outcome: Progressing   Problem: Nutrition: Goal: Adequate nutrition will be maintained Outcome: Progressing   Problem: Coping: Goal: Level of anxiety will decrease Outcome: Progressing   Problem: Elimination: Goal: Will not  experience complications related to bowel motility Outcome: Progressing Goal: Will not experience complications related to urinary retention Outcome: Progressing   Problem: Pain Managment: Goal: General experience of comfort will improve and/or be controlled Outcome: Progressing   Problem: Safety: Goal: Ability to remain free from injury will improve Outcome: Progressing   Problem: Skin Integrity: Goal: Risk for impaired skin integrity will decrease Outcome: Progressing   Problem: Education: Goal: Understanding of CV disease, CV risk reduction, and recovery process will improve Outcome: Progressing Goal: Individualized Educational Video(s) Outcome: Progressing   Problem: Activity: Goal: Ability to return to baseline activity level will improve Outcome: Progressing   Problem: Cardiovascular: Goal: Ability to achieve and maintain adequate cardiovascular perfusion will improve Outcome: Progressing Goal: Vascular access site(s) Level 0-1 will be maintained Outcome: Progressing   Problem: Health Behavior/Discharge Planning: Goal: Ability to safely manage health-related needs after discharge will improve Outcome: Progressing   Problem: Education: Goal: Knowledge of disease and its progression will improve Outcome: Progressing Goal: Individualized Educational Video(s) Outcome: Progressing   Problem: Fluid Volume: Goal: Compliance with measures to maintain balanced fluid volume will improve Outcome: Progressing   Problem: Health Behavior/Discharge Planning: Goal: Ability to manage health-related needs will improve Outcome: Progressing   Problem: Nutritional: Goal: Ability to make healthy dietary choices will improve Outcome: Progressing   Problem: Clinical Measurements: Goal: Complications related to the disease process, condition or treatment will be avoided or minimized Outcome: Progressing   Problem: Education: Goal: Ability to demonstrate management of disease  process will improve Outcome: Progressing Goal: Ability to verbalize understanding of medication therapies will improve Outcome: Progressing Goal: Individualized Educational Video(s) Outcome: Progressing   Problem: Activity: Goal: Capacity to carry out activities will improve Outcome: Progressing   Problem: Cardiac: Goal: Ability to achieve and maintain adequate cardiopulmonary perfusion will improve Outcome: Progressing

## 2023-10-05 NOTE — Progress Notes (Addendum)
 PROGRESS NOTE    Kirk [name]  FMW:[id number] DOB: 1960-10-04 DOA: 10/04/2023 PCP: Leonarda Roxan [name], NP   Brief Narrative:  Kirk [name] is a 63 year old with ESRD on HD Kirk [name] failed renal transplant), hypertension, combined systolic and diastolic heart failure, gout, hyperlipidemia, OSA on CPAP who  was admitted by cardiology on 10/04/2023 for the evaluation of possible ST elevation MI.  Patient was taken directly to the Cath Lab and catheterization showed minimal CAD with no change from prior cath but did show reduced EF to 35-40%.  TRH consulted to help manage chronic medical problems and address low-grade fever.   Assessment & Plan:   Principal Problem:   Accelerated hypertension with diastolic congestive heart failure, NYHA class 3 (HCC) Active Problems:   ESRD on dialysis (HCC)   Fever   Hypertensive urgency   Acute on chronic combined systolic and diastolic CHF (congestive heart failure) (HCC)   Precordial chest pain  # Chest pain - Presented with acute onset of constant, dull and nonradiating chest pain - Taken straight to the Cath Lab due to evidence of ST elevation on EKG - No significant CAD found on heart catheterization - Chest pain likely secondary to accelerated hypertension and CHF exacerbation - Further recs per cardiology   # Acute on chronic combined systolic and diastolic HF # Chronic hypoxic respiratory failure - Uses 2 L of oxygen  at baseline.  Last TTE in 2//25 showed EF 45 to 50%, LV global hypokinesis, G2DD, moderately elevated PASP, and moderately dilated LA - LHC on admission showed moderate-severe reduced EF to 35-40% - Patient with signs of CHF exacerbation on exam but no significant shortness of breath or hypoxia - Does not make urine, will need HD for fluid removal. - Follow-up repeat echocardiogram   # ESRD on HD - On Monday Wednesday Friday HD, last HD session prior to admission on 10/04/2023. - Patient still hypervolemic but  remains stable on baseline 2 L [place] - I have consulted nephrology/Dr. Dolan.  Patient may need dialysis sooner than Monday since he appears to have CHF and does not produce urine. - Continue Rena-Vit and Renvela  and Velphoro .   # Hypertensive urgency rule out/history of hypertension: - Patient found to have SBP in the 170s on admission.  Did not meet criteria for hypertensive urgency.  Patient's home medications including Lopressor , Imdur , amlodipine  and Cardura  resumed and blood pressure has improved.   # Fever/viral (rhinovirus) and possibly bacterial community-acquired pneumonia: - Patient found to have fever 100.8 on admission, reported mild shortness of breath but no cough. X-ray does show some diffuse interstitial and ground glass opacities favoring pulmonary edema but cannot rule out atypical infection.  Afebrile since then, has mild leukopenia.  Negative for COVID, influenza or RSV but positive for rhinovirus.  Based on the personal review of the x-ray, likely viral pneumonia but bacterial superinfection cannot be ruled out.  Will treat symptomatically and add Rocephin  and Zithromax .   # Hypokalemia Replenished and resolved  # HLD - Continue pravastatin    # GERD - Continue Protonix    # OSA - Continue CPAP at bedtime   Addendum/PS: Dr. Michele requested hospitalist to take over as primary.  I have switched my name as attending provider for this patient.  DVT prophylaxis: heparin  injection 5,000 Units Start: 10/04/23 2200   Code Status: Full Code  Family Communication:  None present at bedside.  Plan of care discussed with patient in length and he/she verbalized understanding and agreed with it.  Status is: Observation The patient will require care spanning > 2 midnights and should be moved to inpatient because: Still in acute on chronic CHF,   Estimated body mass index is 29.04 kg/m as calculated from the following:   Height as of 07/03/23: 5' 7 (1.702 m).   Weight as of this  encounter: 84.1 kg.    Nutritional Assessment: Body mass index is 29.04 kg/m.SABRA Seen by dietician.  I agree with the assessment and plan as outlined below: Nutrition Status:        . Skin Assessment: I have examined the patient's skin and I agree with the wound assessment as performed by the wound care RN as outlined below:    Consultants:  TRH and nephrology  Procedures:  None  Antimicrobials:  Anti-infectives (From admission, onward)    Start     Dose/Rate Route Frequency Ordered Stop   10/05/23 0915  cefTRIAXone  (ROCEPHIN ) 1 g in sodium chloride  0.9 % 100 mL IVPB        1 g 200 mL/hr over 30 Minutes Intravenous Every 24 hours 10/05/23 0822     10/05/23 0915  azithromycin  (ZITHROMAX ) 250 mg in dextrose  5 % 125 mL IVPB        250 mg 127.5 mL/hr over 60 Minutes Intravenous Every 24 hours 10/05/23 9177           Subjective: Patient seen and examined, he was sitting that he was  not feeling well but he could not elaborate or come up with any symptoms.  When asked him direct questions, he endorsed yes to having intermittent chest pain and shortness of breath as well as headache.  Objective: Vitals:   10/05/23 0155 10/05/23 0405 10/05/23 0500 10/05/23 0729  BP:  (!) 122/59  128/62  Pulse: 92 (!) 102 97 86  Resp: (!) 23 16 (!) 21 (!) 24  Temp:  99.8 F (37.7 C) 99 F (37.2 C) 98.8 F (37.1 C)  TempSrc:  Oral Oral Oral  SpO2:  92%  92%  Weight:        Intake/Output Summary (Last 24 hours) at 10/05/2023 9177 Last data filed at 10/05/2023 0000 Gross per 24 hour  Intake 297 ml  Output --  Net 297 ml   Filed Weights   10/04/23 2014  Weight: 84.1 kg    Examination:  General exam: Appears calm and comfortable  Respiratory system: Coarse breath sounds with rhonchi bilaterally and possibly some crackles at the bases. Respiratory effort normal. Cardiovascular system: S1 & S2 heard, RRR. No JVD, murmurs, rubs, gallops or clicks. No pedal edema. Gastrointestinal  system: Abdomen is nondistended, soft and nontender. No organomegaly or masses felt. Normal bowel sounds heard. Central nervous system: Alert and oriented. No focal neurological deficits. Extremities: Symmetric 5 x 5 power. Skin: No rashes, lesions or ulcers Psychiatry: Judgement and insight appear normal. Mood & affect appropriate.    Data Reviewed: I have personally reviewed following labs and imaging studies  CBC: Recent Labs  Lab 10/04/23 1855 10/04/23 1924 10/04/23 1925 10/04/23 2049 10/05/23 0447  WBC 3.2*  --   --  3.0* 2.3*  NEUTROABS 2.4  --   --   --   --   HGB 9.4* 9.2* 9.5* 8.2* 7.8*  HCT 29.6* 27.0* 28.0* 25.4* 24.0*  MCV 95.8  --   --  95.1 95.2  PLT 78*  --   --  76* 78*   Basic Metabolic Panel: Recent Labs  Lab 10/04/23 1855 10/04/23 1924 10/04/23  1925 10/05/23 0447  NA 136 136 137 136  K 3.0* 3.0* 3.1* 3.6  CL 93* 92*  --  92*  CO2 30  --   --  30  GLUCOSE 93 98  --  112*  BUN 10 11  --  16  CREATININE 5.33* 5.40*  --  6.78*  CALCIUM  8.3*  --   --  7.9*  MG  --   --   --  1.8  PHOS  --   --   --  4.8*   GFR: Estimated Creatinine Clearance: 11.9 mL/min (A) (by C-G formula based on SCr of 6.78 mg/dL (H)). Liver Function Tests: Recent Labs  Lab 10/04/23 1855 10/05/23 0447  AST 15  --   ALT 10  --   ALKPHOS 65  --   BILITOT 0.9  --   PROT 7.0  --   ALBUMIN  3.3* 2.9*   No results for input(s): LIPASE, AMYLASE in the last 168 hours. No results for input(s): AMMONIA in the last 168 hours. Coagulation Profile: No results for input(s): INR, PROTIME in the last 168 hours. Cardiac Enzymes: No results for input(s): CKTOTAL, CKMB, CKMBINDEX, TROPONINI in the last 168 hours. BNP (last 3 results) No results for input(s): PROBNP in the last 8760 hours. HbA1C: No results for input(s): HGBA1C in the last 72 hours. CBG: No results for input(s): GLUCAP in the last 168 hours. Lipid Profile: No results for input(s): CHOL,  HDL, LDLCALC, TRIG, CHOLHDL, LDLDIRECT in the last 72 hours. Thyroid  Function Tests: No results for input(s): TSH, T4TOTAL, FREET4, T3FREE, THYROIDAB in the last 72 hours. Anemia Panel: No results for input(s): VITAMINB12, FOLATE, FERRITIN, TIBC, IRON, RETICCTPCT in the last 72 hours. Sepsis Labs: Recent Labs  Lab 10/04/23 1928 10/04/23 1938 10/04/23 2032  LATICACIDVEN 0.6 0.4* 0.9    Recent Results (from the past 240 hours)  Resp panel by RT-PCR (RSV, Flu A&B, Covid) Anterior Nasal Swab     Status: None   Collection Time: 10/05/23  4:00 AM   Specimen: Anterior Nasal Swab  Result Value Ref Range Status   SARS Coronavirus 2 by RT PCR NEGATIVE NEGATIVE Final   Influenza A by PCR NEGATIVE NEGATIVE Final   Influenza B by PCR NEGATIVE NEGATIVE Final    Comment: (NOTE) The Xpert Xpress SARS-CoV-2/FLU/RSV plus assay is intended as an aid in the diagnosis of influenza from Nasopharyngeal swab specimens and should not be used as a sole basis for treatment. Nasal washings and aspirates are unacceptable for Xpert Xpress SARS-CoV-2/FLU/RSV testing.  Fact Sheet for Patients: BloggerCourse.com  Fact Sheet for Healthcare Providers: SeriousBroker.it  This test is not yet approved or cleared by the [place]  FDA and has been authorized for detection and/or diagnosis of SARS-CoV-2 by FDA under an Emergency Use Authorization (EUA). This EUA will remain in effect (meaning this test can be used) for the duration of the COVID-19 declaration under Section 564(b)(1) of the Act, 21 U.S.C. section 360bbb-3(b)(1), unless the authorization is terminated or revoked.     Resp Syncytial Virus by PCR NEGATIVE NEGATIVE Final    Comment: (NOTE) Fact Sheet for Patients: BloggerCourse.com  Fact Sheet for Healthcare Providers: SeriousBroker.it  This test is not yet  approved or cleared by the [place]  FDA and has been authorized for detection and/or diagnosis of SARS-CoV-2 by FDA under an Emergency Use Authorization (EUA). This EUA will remain in effect (meaning this test can be used) for the duration of the COVID-19 declaration under Section 564(b)(1)  of the Act, 21 U.S.C. section 360bbb-3(b)(1), unless the authorization is terminated or revoked.  Performed at [facility], [address] [place], [place] [address]   Respiratory (~20 pathogens) panel by PCR     Status: Abnormal   Collection Time: 10/05/23  4:00 AM   Specimen: Nasopharyngeal Swab; Respiratory  Result Value Ref Range Status   Adenovirus NOT DETECTED NOT DETECTED Final   Coronavirus 229E NOT DETECTED NOT DETECTED Final    Comment: (NOTE) The Coronavirus on the Respiratory Panel, DOES NOT test for the novel  Coronavirus (2019 nCoV)    Coronavirus HKU1 NOT DETECTED NOT DETECTED Final   Coronavirus NL63 NOT DETECTED NOT DETECTED Final   Coronavirus OC43 NOT DETECTED NOT DETECTED Final   Metapneumovirus NOT DETECTED NOT DETECTED Final   Rhinovirus / Enterovirus DETECTED (A) NOT DETECTED Final   Influenza A NOT DETECTED NOT DETECTED Final   Influenza B NOT DETECTED NOT DETECTED Final   Parainfluenza Virus 1 NOT DETECTED NOT DETECTED Final   Parainfluenza Virus 2 NOT DETECTED NOT DETECTED Final   Parainfluenza Virus 3 NOT DETECTED NOT DETECTED Final   Parainfluenza Virus 4 NOT DETECTED NOT DETECTED Final   Respiratory Syncytial Virus NOT DETECTED NOT DETECTED Final   Bordetella pertussis NOT DETECTED NOT DETECTED Final   Bordetella Parapertussis NOT DETECTED NOT DETECTED Final   Chlamydophila pneumoniae NOT DETECTED NOT DETECTED Final   Mycoplasma pneumoniae NOT DETECTED NOT DETECTED Final    Comment: Performed at [facility], [address] [place], [place] [address]  MRSA Next Gen by PCR, Nasal     Status: None   Collection Time: 10/05/23  4:00 AM    Specimen: Nasal Mucosa; Nasal Swab  Result Value Ref Range Status   MRSA by PCR Next Gen NOT DETECTED NOT DETECTED Final    Comment: (NOTE) The GeneXpert MRSA Assay (FDA approved for NASAL specimens only), is one component of a comprehensive MRSA colonization surveillance program. It is not intended to diagnose MRSA infection nor to guide or monitor treatment for MRSA infections. Test performance is not FDA approved in patients less than 75 years old. Performed at [facility], [address] [place], [place] [address]      Radiology Studies: DG Chest Port 1 View Result Date: 10/04/2023 CLINICAL DATA:  Shortness of breath EXAM: PORTABLE CHEST 1 VIEW COMPARISON:  06/19/2023, 03/18/2023 FINDINGS: Hardware in the cervical spine. Cardiomegaly. Aortic atherosclerosis. Cardiomegaly with vascular congestion and diffuse interstitial and ground-glass opacity, slightly progressive, favor pulmonary edema but atypical infection could also produce this appearance IMPRESSION: Cardiomegaly with vascular congestion and diffuse interstitial and ground-glass opacity, slightly progressive, favor pulmonary edema but atypical infection could also produce this appearance. Electronically Signed   By: Luke Bun M.D.   On: 10/04/2023 21:05   CARDIAC CATHETERIZATION Result Date: 10/04/2023 Table formatting from the original result was not included. Images from the original result were not included. Dominance: Right                                                                                LV Gram (2 - Hypokinesis); EF ~35-40%  Angiographically minimal CAD with no change from prior catheterization. Moderate-severely reduced LVEF (estimated 30 to 35% with global hypokinesis) with EDP of 16 to 18 mmHg. => Nonischemic Cardiomyopathy as previously indicated  RECOMMENDATIONS   Anticipated discharge date to be determined.   The patient will be followed by cardiology but will likely need to be comanaged by  nephrology and internal medicine. Will need aggressive blood pressure management.   No indication for antiplatelet therapy at this time . Alm Clay, MD   Scheduled Meds:  amLODipine   10 mg Oral Daily   aspirin  EC  81 mg Oral Q1500   doxazosin   4 mg Oral QHS   fluticasone  furoate-vilanterol  1 puff Inhalation Daily   heparin   5,000 Units Subcutaneous Q8H   isosorbide  mononitrate  60 mg Oral Daily   metoprolol  tartrate  50 mg Oral BID   multivitamin  1 tablet Oral Daily   pantoprazole   40 mg Oral Daily   pravastatin   40 mg Oral QHS   sevelamer  carbonate  4.8 g Oral TID WC   sodium chloride  flush  3 mL Intravenous Q12H   sucroferric oxyhydroxide  1,000-1,500 mg Oral See admin instructions   traZODone   50 mg Oral QHS   Continuous Infusions:  sodium chloride      azithromycin      cefTRIAXone  (ROCEPHIN )  IV       LOS: 0 days   Fredia Skeeter, MD [facility]  10/05/2023, 8:22 AM   *Please note that this is a verbal dictation therefore any spelling or grammatical errors are due to the Dragon Medical One system interpretation.  Please page via Amion and do not message via secure chat for urgent patient care matters. Secure chat can be used for non urgent patient care matters.  How to contact the TRH Attending or Consulting provider 7A - 7P or covering provider during after hours 7P -7A, for this patient?  Check the care team in [facility] and look for a) attending/consulting TRH provider listed and b) the TRH team listed. Page or secure chat 7A-7P. Log into [email or web address] and use [place]'s universal password to access. If you do not have the password, please contact the hospital operator. Locate the TRH provider you are looking for under [facility] and page to a number that you can be directly reached. If you still have difficulty reaching the provider, please page the [facility] (Director on Call) for the Hospitalists listed on amion for assistance.

## 2023-10-05 NOTE — Progress Notes (Signed)
  Progress Note  Patient Name: Kirk [name] Date of Encounter: 10/05/2023 [facility] Cardiologist: Vinie [name], MD   Hpi: Review of electronic medical records History of HD [name] failed renal transplant), hypertension with Hypertensive Heart Disease with combined systolic and diastolic heart failure, gout, hyperlipidemia, OSA on CPAP who is being seen 10/04/2023 for the evaluation of possible ST elevation MI.   Due to concerns for possible anterior wall MI patient was taken to the Cath Lab for coronary angiography. Patient was noted to have minimal CAD and normal LVEDP. Recommended comanagement with medicine and nephrology given his low-grade temperature, comorbid conditions, and dialysis  Interval Summary   Resting in bed comfortably. No anginal chest pain or heart failure symptoms On nasal cannula oxygen  And contact for caution secondary to rhinovirus being detected  Vital Signs Vitals:   10/05/23 0405 10/05/23 0500 10/05/23 0729 10/05/23 0803  BP: (!) 122/59  128/62   Pulse: (!) 102 97 86   Resp: 16 (!) 21 (!) 24   Temp: 99.8 F (37.7 C) 99 F (37.2 C) 98.8 F (37.1 C)   TempSrc: Oral Oral Oral   SpO2: 92%  92% 93%  Weight:        Intake/Output Summary (Last 24 hours) at 10/05/2023 1038 Last data filed at 10/05/2023 0000 Gross per 24 hour  Intake 297 ml  Output --  Net 297 ml      10/04/2023    8:14 PM 07/03/2023    8:31 AM 06/24/2023    1:35 PM  Last 3 Weights  Weight (lbs) 185 lb 6.5 oz 182 lb 9.6 oz 187 lb 6.3 oz  Weight (kg) 84.1 kg 82.827 kg 85 kg      Telemetry/ECG  Sinus rhythm- Personally Reviewed  Physical Exam  GEN: No acute distress.  Appears older than stated age.  Hemodynamically stable Neck: No JVD Cardiac: RRR, no murmurs, rubs, or gallops.  Respiratory: Clear to auscultation bilaterally.  On nasal cannula oxygen  GI: Soft, nontender, non-distended  MS: No edema  Assessment & Plan  Nonischemic  cardiomyopathy Left catheterization 10/04/2023: Minimal nonobstructive CAD. Echocardiogram this hospitalization is pending. Echo February 2025: LVEF 45 to 50%, global hypokinesis, grade 2 diastolic dysfunction, elevated PAP at 60 mmHg GDMT limited secondary to ESRD BNP 2277 Lactic acid within normal limits. Currently on amlodipine  10 mg p.o. daily. Currently on Imdur  60 mg p.o. daily. Currently on Lopressor  50 mg p.o. twice daily. Currently on pravastatin  40 mg p.o. daily. Currently on aspirin  81 mg p.o. daily Would recommend discontinuation of amlodipine  and transitioning to hydralazine  25 mg p.o. daily. Would recommend transitioning Lopressor  to 50 mg p.o. 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 Blood pressures closer to arrival 173/81. Medications as discussed above Reemphasized importance of low-salt diet. Reemphasized importance of dialysis compliance  Management per primary: Pancytopenia  Acute febrile illness - Tmax is 100.8 degrees Rhinovirus  [facility] will sign off.   Medication Recommendations: As discussed above Follow up as an outpatient: Will arrange  postdischarge For questions or updates, please contact  [facility] Please consult [email or web address] for contact info under         Signed, Kjersten Ormiston, DO

## 2023-10-05 NOTE — Progress Notes (Signed)
 I spoke to wife [name] Morel telephonically @ [phone] and received verbal consent, patient under Droplet Precaution.

## 2023-10-06 DIAGNOSIS — N186 End stage renal disease: Secondary | ICD-10-CM | POA: Diagnosis not present

## 2023-10-06 DIAGNOSIS — D61818 Other pancytopenia: Secondary | ICD-10-CM | POA: Diagnosis not present

## 2023-10-06 DIAGNOSIS — B348 Other viral infections of unspecified site: Secondary | ICD-10-CM | POA: Diagnosis not present

## 2023-10-06 DIAGNOSIS — Z992 Dependence on renal dialysis: Secondary | ICD-10-CM | POA: Diagnosis not present

## 2023-10-06 DIAGNOSIS — I350 Nonrheumatic aortic (valve) stenosis: Secondary | ICD-10-CM

## 2023-10-06 DIAGNOSIS — I5030 Unspecified diastolic (congestive) heart failure: Secondary | ICD-10-CM | POA: Diagnosis not present

## 2023-10-06 DIAGNOSIS — I428 Other cardiomyopathies: Secondary | ICD-10-CM | POA: Diagnosis not present

## 2023-10-06 DIAGNOSIS — I110 Hypertensive heart disease with heart failure: Secondary | ICD-10-CM | POA: Diagnosis not present

## 2023-10-06 LAB — PREPARE RBC (CROSSMATCH)

## 2023-10-06 LAB — CBC WITH DIFFERENTIAL/PLATELET
Abs Immature Granulocytes: 0.01 K/uL (ref 0.00–0.07)
Basophils Absolute: 0 K/uL (ref 0.0–0.1)
Basophils Relative: 1 %
Eosinophils Absolute: 0.1 K/uL (ref 0.0–0.5)
Eosinophils Relative: 4 %
HCT: 22.6 % — ABNORMAL LOW (ref 39.0–52.0)
Hemoglobin: 7.1 g/dL — ABNORMAL LOW (ref 13.0–17.0)
Immature Granulocytes: 0 %
Lymphocytes Relative: 25 %
Lymphs Abs: 0.6 K/uL — ABNORMAL LOW (ref 0.7–4.0)
MCH: 30.6 pg (ref 26.0–34.0)
MCHC: 31.4 g/dL (ref 30.0–36.0)
MCV: 97.4 fL (ref 80.0–100.0)
Monocytes Absolute: 0.3 K/uL (ref 0.1–1.0)
Monocytes Relative: 12 %
Neutro Abs: 1.5 K/uL — ABNORMAL LOW (ref 1.7–7.7)
Neutrophils Relative %: 58 %
Platelets: 86 K/uL — ABNORMAL LOW (ref 150–400)
RBC: 2.32 MIL/uL — ABNORMAL LOW (ref 4.22–5.81)
RDW: 15.4 % (ref 11.5–15.5)
WBC: 2.5 K/uL — ABNORMAL LOW (ref 4.0–10.5)
nRBC: 0 % (ref 0.0–0.2)

## 2023-10-06 LAB — CBC
HCT: 21.7 % — ABNORMAL LOW (ref 39.0–52.0)
Hemoglobin: 6.9 g/dL — CL (ref 13.0–17.0)
MCH: 31.1 pg (ref 26.0–34.0)
MCHC: 31.8 g/dL (ref 30.0–36.0)
MCV: 97.7 fL (ref 80.0–100.0)
Platelets: 69 K/uL — ABNORMAL LOW (ref 150–400)
RBC: 2.22 MIL/uL — ABNORMAL LOW (ref 4.22–5.81)
RDW: 15.5 % (ref 11.5–15.5)
WBC: 1.7 K/uL — ABNORMAL LOW (ref 4.0–10.5)
nRBC: 0 % (ref 0.0–0.2)

## 2023-10-06 LAB — RENAL FUNCTION PANEL
Albumin: 2.7 g/dL — ABNORMAL LOW (ref 3.5–5.0)
Anion gap: 13 (ref 5–15)
BUN: 35 mg/dL — ABNORMAL HIGH (ref 8–23)
CO2: 27 mmol/L (ref 22–32)
Calcium: 8.2 mg/dL — ABNORMAL LOW (ref 8.9–10.3)
Chloride: 92 mmol/L — ABNORMAL LOW (ref 98–111)
Creatinine, Ser: 10.18 mg/dL — ABNORMAL HIGH (ref 0.61–1.24)
GFR, Estimated: 5 mL/min — ABNORMAL LOW (ref >60)
Glucose, Bld: 108 mg/dL — ABNORMAL HIGH (ref 70–99)
Phosphorus: 6.4 mg/dL — ABNORMAL HIGH (ref 2.5–4.6)
Potassium: 4.1 mmol/L (ref 3.5–5.1)
Sodium: 132 mmol/L — ABNORMAL LOW (ref 135–145)

## 2023-10-06 LAB — HEPATITIS B SURFACE ANTIGEN: Hepatitis B Surface Ag: NONREACTIVE

## 2023-10-06 MED ORDER — HEPARIN SODIUM (PORCINE) 1000 UNIT/ML DIALYSIS: 1000.0000 [IU] | INTRAMUSCULAR | Status: DC | PRN

## 2023-10-06 MED ORDER — SODIUM CHLORIDE 0.9% IV SOLUTION
Freq: Once | INTRAVENOUS | Status: DC
Start: 2023-10-06 — End: 2023-10-08

## 2023-10-06 MED ORDER — CHLORHEXIDINE GLUCONATE CLOTH 2 % EX PADS
6.0000 | MEDICATED_PAD | Freq: Every day | CUTANEOUS | Status: DC
  Administered 2023-10-08: 6 via TOPICAL

## 2023-10-06 MED ORDER — LIDOCAINE HCL (PF) 1 % IJ SOLN: 5.0000 mL | INTRAMUSCULAR | Status: DC | PRN

## 2023-10-06 MED ORDER — HYDRALAZINE HCL 25 MG PO TABS
25.0000 mg | ORAL_TABLET | Freq: Two times a day (BID) | ORAL | Status: DC
  Administered 2023-10-06 – 2023-10-07 (×3): 25 mg via ORAL
  Filled 2023-10-06 (×3): qty 1

## 2023-10-06 MED ORDER — ANTICOAGULANT SODIUM CITRATE 4% (200MG/5ML) IV SOLN: 5.0000 mL | Status: DC | PRN

## 2023-10-06 MED ORDER — LIDOCAINE-PRILOCAINE 2.5-2.5 % EX CREA: 1.0000 | TOPICAL_CREAM | CUTANEOUS | Status: DC | PRN

## 2023-10-06 MED ORDER — ALTEPLASE 2 MG IJ SOLR: 2.0000 mg | Freq: Once | INTRAMUSCULAR | Status: DC | PRN

## 2023-10-06 MED ORDER — PENTAFLUOROPROP-TETRAFLUOROETH EX AERO: 1.0000 | INHALATION_SPRAY | CUTANEOUS | Status: DC | PRN

## 2023-10-06 NOTE — Progress Notes (Signed)
 PROGRESS NOTE    Kirk [name]  FMW:[id number] DOB: 08-28-1960 DOA: 10/04/2023 PCP: Leonarda Roxan [name], NP   Brief Narrative:  Kirk [name] is a 63 year old with ESRD on HD [name] failed renal transplant), hypertension, combined systolic and diastolic heart failure, gout, hyperlipidemia, OSA on CPAP who  was admitted by cardiology on 10/04/2023 for the evaluation of possible ST elevation MI.  Patient was taken directly to the Cath Lab and catheterization showed minimal CAD with no change from prior cath but did show reduced EF to 35-40%.  TRH consulted to help manage chronic medical problems and address low-grade fever.   Assessment & Plan:   Principal Problem:   Accelerated hypertension with diastolic congestive heart failure, NYHA class 3 (HCC) Active Problems:   ESRD on dialysis (HCC)   Fever   Hypertensive urgency   Acute on chronic combined systolic and diastolic CHF (congestive heart failure) (HCC)   Precordial chest pain   Nonischemic cardiomyopathy (HCC)   Benign hypertensive kidney disease with chronic kidney disease   Rhinovirus infection   Acute febrile illness  # Chest pain - Presented with acute onset of constant, dull and nonradiating chest pain - Taken straight to the Cath Lab due to evidence of ST elevation on EKG - No significant CAD found on heart catheterization - Chest pain likely secondary to accelerated hypertension and CHF exacerbation which has resolved now.  Cardiology on board.   # Acute on chronic combined systolic and diastolic HF # Chronic hypoxic respiratory failure - Uses 2 L of oxygen  at baseline.  Last TTE in 2//25 showed EF 45 to 50%, LV global hypokinesis, G2DD, moderately elevated PASP, and moderately dilated LA - LHC on admission showed moderate-severe reduced EF to 35-40% - Patient with signs of CHF exacerbation on exam but no significant shortness of breath or hypoxia.  Repeat echo shows EF of 25 to 50% with grade 2 diastolic  dysfunction.  Patient does not make urine, volume overloaded with crackles today again, nephrology on board, plan for extra HD session today.   # ESRD on HD - On Monday Wednesday Friday HD, last HD session prior to admission on 10/04/2023. - Patient still hypervolemic but remains stable on baseline 2 L [place] - I have consulted nephrology/Dr. Dolan.  Plan for HD today as mentioned above.  Appreciate nephrology help. - Continue Rena-Vit and Renvela  and Velphoro .   # Hypertensive urgency rule out/history of hypertension: - Patient found to have SBP in the 170s on admission.  Did not meet criteria for hypertensive urgency.  Patient's home medications including Lopressor , Imdur , amlodipine  and Cardura  resumed and blood pressure has improved.   # Fever/viral (rhinovirus) and possibly bacterial community-acquired pneumonia: - Patient found to have fever 100.8 on admission, reported mild shortness of breath but no cough. X-ray does show some diffuse interstitial and ground glass opacities favoring pulmonary edema but cannot rule out atypical infection.  Afebrile since then, has mild leukopenia.  Negative for COVID, influenza or RSV but positive for rhinovirus.  Based on the personal review of the x-ray, likely viral pneumonia but bacterial superinfection cannot be ruled out.  Treat symptomatically as well as with Rocephin  and Zithromax .   # Hypokalemia Replenished and resolved  # HLD - Continue pravastatin    # GERD - Continue Protonix    # OSA - Continue CPAP at bedtime  anemia of chronic disease: Baseline hemoglobin between 8-9.  Down to 0.9 today.  Discussed with nephrology, they will transfuse 1 unit of  PRBC today at dialysis.  DVT prophylaxis: heparin  injection 5,000 Units Start: 10/04/23 2200   Code Status: Full Code  Family Communication:  None present at bedside.  Plan of care discussed with patient in length and he/she verbalized understanding and agreed with it.  Status is:  Observation The patient will require care spanning > 2 midnights and should be moved to inpatient because: Still in acute on chronic CHF,   Estimated body mass index is 29.28 kg/m as calculated from the following:   Height as of this encounter: 5' 7 (1.702 m).   Weight as of this encounter: 84.8 kg.    Nutritional Assessment: Body mass index is 29.28 kg/m.Kirk [name] Seen by dietician.  I agree with the assessment and plan as outlined below: Nutrition Status:        . Skin Assessment: I have examined the patient's skin and I agree with the wound assessment as performed by the wound care RN as outlined below:    Consultants:  TRH and nephrology  Procedures:  None  Antimicrobials:  Anti-infectives (From admission, onward)    Start     Dose/Rate Route Frequency Ordered Stop   10/05/23 0915  cefTRIAXone  (ROCEPHIN ) 2 g in sodium chloride  0.9 % 100 mL IVPB        2 g 200 mL/hr over 30 Minutes Intravenous Every 24 hours 10/05/23 0822     10/05/23 0915  azithromycin  (ZITHROMAX ) 500 mg in sodium chloride  0.9 % 250 mL IVPB        500 mg 250 mL/hr over 60 Minutes Intravenous Every 24 hours 10/05/23 9177           Subjective: Patient seen and examined.  Complains of shortness of breath.  Overall feels weak.  No other complaint.  Objective: Vitals:   10/06/23 0026 10/06/23 0211 10/06/23 0400 10/06/23 0832  BP: (!) 100/50  122/65 128/64  Pulse: 66 62 68 68  Resp: 20 15 20 19   Temp: 98.4 F (36.9 C)  97.8 F (36.6 C) 97.9 F (36.6 C)  TempSrc: Oral  Axillary Axillary  SpO2: 96% 96% 98% 99%  Weight:   84.8 kg   Height:        Intake/Output Summary (Last 24 hours) at 10/06/2023 0851 Last data filed at 10/05/2023 2000 Gross per 24 hour  Intake 707 ml  Output --  Net 707 ml   Filed Weights   10/04/23 2014 10/06/23 0400  Weight: 84.1 kg 84.8 kg    Examination:  General exam: Appears calm and comfortable, appears sick Respiratory system: Breath sounds with bilateral  rhonchi and bibasilar crackles. Respiratory effort normal. Cardiovascular system: S1 & S2 heard, RRR. No JVD, murmurs, rubs, gallops or clicks. No pedal edema. Gastrointestinal system: Abdomen is nondistended, soft and nontender. No organomegaly or masses felt. Normal bowel sounds heard. Central nervous system: Alert and oriented. No focal neurological deficits. Extremities: Symmetric 5 x 5 power. Skin: No rashes, lesions or ulcers.  Psychiatry: Judgement and insight appear normal. Mood & affect appropriate.   Data Reviewed: I have personally reviewed following labs and imaging studies  CBC: Recent Labs  Lab 10/04/23 1855 10/04/23 1924 10/04/23 1925 10/04/23 2049 10/05/23 0447 10/06/23 0503  WBC 3.2*  --   --  3.0* 2.3* 2.5*  NEUTROABS 2.4  --   --   --   --  1.5*  HGB 9.4* 9.2* 9.5* 8.2* 7.8* 7.1*  HCT 29.6* 27.0* 28.0* 25.4* 24.0* 22.6*  MCV 95.8  --   --  95.1 95.2 97.4  PLT 78*  --   --  76* 78* 86*   Basic Metabolic Panel: Recent Labs  Lab 10/04/23 1855 10/04/23 1924 10/04/23 1925 10/05/23 0447  NA 136 136 137 136  K 3.0* 3.0* 3.1* 3.6  CL 93* 92*  --  92*  CO2 30  --   --  30  GLUCOSE 93 98  --  112*  BUN 10 11  --  16  CREATININE 5.33* 5.40*  --  6.78*  CALCIUM  8.3*  --   --  7.9*  MG  --   --   --  1.8  PHOS  --   --   --  4.8*   GFR: Estimated Creatinine Clearance: 11.9 mL/min (A) (by C-G formula based on SCr of 6.78 mg/dL (H)). Liver Function Tests: Recent Labs  Lab 10/04/23 1855 10/05/23 0447  AST 15  --   ALT 10  --   ALKPHOS 65  --   BILITOT 0.9  --   PROT 7.0  --   ALBUMIN  3.3* 2.9*   No results for input(s): LIPASE, AMYLASE in the last 168 hours. No results for input(s): AMMONIA in the last 168 hours. Coagulation Profile: No results for input(s): INR, PROTIME in the last 168 hours. Cardiac Enzymes: No results for input(s): CKTOTAL, CKMB, CKMBINDEX, TROPONINI in the last 168 hours. BNP (last 3 results) No results for  input(s): PROBNP in the last 8760 hours. HbA1C: No results for input(s): HGBA1C in the last 72 hours. CBG: No results for input(s): GLUCAP in the last 168 hours. Lipid Profile: No results for input(s): CHOL, HDL, LDLCALC, TRIG, CHOLHDL, LDLDIRECT in the last 72 hours. Thyroid  Function Tests: No results for input(s): TSH, T4TOTAL, FREET4, T3FREE, THYROIDAB in the last 72 hours. Anemia Panel: No results for input(s): VITAMINB12, FOLATE, FERRITIN, TIBC, IRON, RETICCTPCT in the last 72 hours. Sepsis Labs: Recent Labs  Lab 10/04/23 1928 10/04/23 1938 10/04/23 2032  LATICACIDVEN 0.6 0.4* 0.9    Recent Results (from the past 240 hours)  Blood culture (routine x 2)     Status: None (Preliminary result)   Collection Time: 10/04/23  8:42 PM   Specimen: BLOOD  Result Value Ref Range Status   Specimen Description BLOOD RIGHT ANTECUBITAL  Final   Special Requests   Final    BOTTLES DRAWN AEROBIC AND ANAEROBIC Blood Culture adequate volume   Culture   Final    NO GROWTH 2 DAYS Performed at [facility], [address], [place], [place] [address]    Report Status PENDING  Incomplete  Blood culture (routine x 2)     Status: None (Preliminary result)   Collection Time: 10/04/23  8:42 PM   Specimen: BLOOD RIGHT HAND  Result Value Ref Range Status   Specimen Description BLOOD RIGHT HAND  Final   Special Requests   Final    BOTTLES DRAWN AEROBIC AND ANAEROBIC Blood Culture results may not be optimal due to an inadequate volume of blood received in culture bottles   Culture   Final    NO GROWTH 2 DAYS Performed at [facility], [address], [place], [place] [address]    Report Status PENDING  Incomplete  Resp panel by RT-PCR (RSV, Flu A&B, Covid) Anterior Nasal Swab     Status: None   Collection Time: 10/05/23  4:00 AM   Specimen: Anterior Nasal Swab  Result Value Ref Range Status   SARS Coronavirus 2 by RT PCR  NEGATIVE NEGATIVE Final    Influenza A by PCR NEGATIVE NEGATIVE Final   Influenza B by PCR NEGATIVE NEGATIVE Final    Comment: (NOTE) The Xpert Xpress SARS-CoV-2/FLU/RSV plus assay is intended as an aid in the diagnosis of influenza from Nasopharyngeal swab specimens and should not be used as a sole basis for treatment. Nasal washings and aspirates are unacceptable for Xpert Xpress SARS-CoV-2/FLU/RSV testing.  Fact Sheet for Patients: BloggerCourse.com  Fact Sheet for Healthcare Providers: SeriousBroker.it  This test is not yet approved or cleared by the [place]  FDA and has been authorized for detection and/or diagnosis of SARS-CoV-2 by FDA under an Emergency Use Authorization (EUA). This EUA will remain in effect (meaning this test can be used) for the duration of the COVID-19 declaration under Section 564(b)(1) of the Act, 21 U.S.C. section 360bbb-3(b)(1), unless the authorization is terminated or revoked.     Resp Syncytial Virus by PCR NEGATIVE NEGATIVE Final    Comment: (NOTE) Fact Sheet for Patients: BloggerCourse.com  Fact Sheet for Healthcare Providers: SeriousBroker.it  This test is not yet approved or cleared by the [place]  FDA and has been authorized for detection and/or diagnosis of SARS-CoV-2 by FDA under an Emergency Use Authorization (EUA). This EUA will remain in effect (meaning this test can be used) for the duration of the COVID-19 declaration under Section 564(b)(1) of the Act, 21 U.S.C. section 360bbb-3(b)(1), unless the authorization is terminated or revoked.  Performed at [facility], [address] [place], [place] [address]   Respiratory (~20 pathogens) panel by PCR     Status: Abnormal   Collection Time: 10/05/23  4:00 AM   Specimen: Nasopharyngeal Swab; Respiratory  Result Value Ref Range Status   Adenovirus NOT DETECTED NOT DETECTED Final    Coronavirus 229E NOT DETECTED NOT DETECTED Final    Comment: (NOTE) The Coronavirus on the Respiratory Panel, DOES NOT test for the novel  Coronavirus (2019 nCoV)    Coronavirus HKU1 NOT DETECTED NOT DETECTED Final   Coronavirus NL63 NOT DETECTED NOT DETECTED Final   Coronavirus OC43 NOT DETECTED NOT DETECTED Final   Metapneumovirus NOT DETECTED NOT DETECTED Final   Rhinovirus / Enterovirus DETECTED (A) NOT DETECTED Final   Influenza A NOT DETECTED NOT DETECTED Final   Influenza B NOT DETECTED NOT DETECTED Final   Parainfluenza Virus 1 NOT DETECTED NOT DETECTED Final   Parainfluenza Virus 2 NOT DETECTED NOT DETECTED Final   Parainfluenza Virus 3 NOT DETECTED NOT DETECTED Final   Parainfluenza Virus 4 NOT DETECTED NOT DETECTED Final   Respiratory Syncytial Virus NOT DETECTED NOT DETECTED Final   Bordetella pertussis NOT DETECTED NOT DETECTED Final   Bordetella Parapertussis NOT DETECTED NOT DETECTED Final   Chlamydophila pneumoniae NOT DETECTED NOT DETECTED Final   Mycoplasma pneumoniae NOT DETECTED NOT DETECTED Final    Comment: Performed at [facility], [address], [place], [place] [address]  MRSA Next Gen by PCR, Nasal     Status: None   Collection Time: 10/05/23  4:00 AM   Specimen: Nasal Mucosa; Nasal Swab  Result Value Ref Range Status   MRSA by PCR Next Gen NOT DETECTED NOT DETECTED Final    Comment: (NOTE) The GeneXpert MRSA Assay (FDA approved for NASAL specimens only), is one component of a comprehensive MRSA colonization surveillance program. It is not intended to diagnose MRSA infection nor to guide or monitor treatment for MRSA infections. Test performance is not FDA approved in patients less than 2  years old. Performed at [facility], [address], [place], [place] [address]      Radiology Studies: ECHOCARDIOGRAM COMPLETE Result Date: 10/05/2023    ECHOCARDIOGRAM REPORT   Patient Name:   Kirk [name] Date of Exam: 10/05/2023 Medical Rec #:   [id number]       Height:       67.0 in Accession #:    [id number]      Weight:       185.4 lb Date of Birth:  10-09-1960        BSA:          1.958 m Patient Age:    63 years        BP:           100/60 mmHg Patient Gender: M               HR:           74 bpm. Exam Location:  Inpatient Procedure: 2D Echo, Cardiac Doppler and Color Doppler (Both Spectral and Color            Flow Doppler were utilized during procedure). Indications:    Nonischemic Cardiomyopathy I42.8  History:        Patient has no prior history of Echocardiogram examinations,                 most recent 03/18/2023. CHF and Cardiomyopathy, COPD and ESRD,                 Arrythmias:Atrial Fibrillation, Signs/Symptoms:Hypotension and                 Chest Pain; Risk Factors:Hypertension, Sleep Apnea, Diabetes and                 Dyslipidemia.  Sonographer:    Thea Norlander RCS Referring Phys: ALM ORN HARDING IMPRESSIONS  1. Left ventricular ejection fraction, by estimation, is 25 to 30%. The left ventricle has severely decreased function. The left ventricle demonstrates global hypokinesis. Left ventricular diastolic parameters are consistent with Grade II diastolic dysfunction (pseudonormalization). Elevated left atrial pressure. The E/e' is 19.5.  2. Right ventricular systolic function is normal. The right ventricular size is normal. There is moderately elevated pulmonary artery systolic pressure. The estimated right ventricular systolic pressure is 45.5 mmHg.  3. Left atrial size was moderately dilated.  4. Right atrial size was moderately dilated.  5. Moderate mitral subvalvular calcification.  6. The mitral valve is degenerative. Mild to moderate mitral valve regurgitation.  7. The aortic valve was not well visualized. Aortic valve regurgitation is not visualized. Moderate to severe aortic valve stenosis (peak velocity 3.27m/s, MG , AVA VTI 1.04cm2, SVi 35, DI 0.27) consider severe Low flow low gradient AS.  8. The inferior vena cava is  dilated in size with <50% respiratory variability, suggesting right atrial pressure of 15 mmHg. Comparison(s): A prior study was performed on 03/18/2023. LVEF 45-50%, global hypokinesis, G2DD, RVSP 59.48mmHg, moderate LAE, mild RAE, mild to moderate AS, RAP . FINDINGS  Left Ventricle: Left ventricular ejection fraction, by estimation, is 25 to 30%. The left ventricle has severely decreased function. The left ventricle demonstrates global hypokinesis. The left ventricular internal cavity size was normal in size. There is borderline left ventricular hypertrophy. Left ventricular diastolic parameters are consistent with Grade II diastolic dysfunction (pseudonormalization). Elevated left atrial pressure. The E/e' is 19.5. Right Ventricle: The right ventricular size is normal. No increase in right ventricular wall thickness.  Right ventricular systolic function is normal. There is moderately elevated pulmonary artery systolic pressure. The tricuspid regurgitant velocity is 2.76 m/s, and with an assumed right atrial pressure of 15 mmHg, the estimated right ventricular systolic pressure is 45.5 mmHg. Left Atrium: Left atrial size was moderately dilated. Right Atrium: Right atrial size was moderately dilated. Pericardium: There is no evidence of pericardial effusion. Mitral Valve: The mitral valve is degenerative in appearance. Mild to moderate mitral annular calcification. Moderate mitral subvalvular calcification. Mild to moderate mitral valve regurgitation. Tricuspid Valve: The tricuspid valve is not well visualized. Tricuspid valve regurgitation is trivial. No evidence of tricuspid stenosis. Aortic Valve: The aortic valve was not well visualized. Aortic valve regurgitation is not visualized. Moderate to severe aortic valve stenosis (peak velocity 3.35m/s, MG , AVA VTI 1.04cm2, SVi 35, DI 0.27) consider severe Low flow low gradient AS. Aortic valve mean gradient measures 22.0 mmHg. Aortic valve peak gradient  measures 38.7 mmHg. Aortic valve area, by VTI measures 1.04 cm. Pulmonic Valve: The pulmonic valve was normal in structure. Pulmonic valve regurgitation is not visualized. No evidence of pulmonic stenosis. Aorta: The aortic root and ascending aorta are structurally normal, with no evidence of dilitation. Venous: The inferior vena cava is dilated in size with less than 50% respiratory variability, suggesting right atrial pressure of 15 mmHg. IAS/Shunts: The atrial septum is grossly normal.  LEFT VENTRICLE PLAX 2D LVIDd:         5.60 cm   Diastology LVIDs:         4.30 cm   LV e' medial:    5.00 cm/s LV PW:         1.30 cm   LV E/e' medial:  21.6 LV IVS:        1.10 cm   LV e' lateral:   6.53 cm/s LVOT diam:     2.20 cm   LV E/e' lateral: 16.5 LV SV:         69 LV SV Index:   35 LVOT Area:     3.80 cm  RIGHT VENTRICLE             IVC RV S prime:     14.00 cm/s  IVC diam: 2.80 cm TAPSE (M-mode): 2.4 cm LEFT ATRIUM             Index        RIGHT ATRIUM           Index LA diam:        4.30 cm 2.20 cm/m   RA Area:     26.90 cm LA Vol (A2C):   88.3 ml 45.09 ml/m  RA Volume:   93.30 ml  47.65 ml/m LA Vol (A4C):   77.6 ml 39.63 ml/m LA Biplane Vol: 83.4 ml 42.59 ml/m  AORTIC VALVE AV Area (Vmax):    1.11 cm AV Area (Vmean):   1.16 cm AV Area (VTI):     1.04 cm AV Vmax:           311.00 cm/s AV Vmean:          204.333 cm/s AV VTI:            0.661 m AV Peak Grad:      38.7 mmHg AV Mean Grad:      22.0 mmHg LVOT Vmax:         91.20 cm/s LVOT Vmean:        62.533 cm/s LVOT VTI:  0.181 m LVOT/AV VTI ratio: 0.27  AORTA Ao Root diam: 3.10 cm Ao Asc diam:  3.30 cm MITRAL VALVE                TRICUSPID VALVE MV Area (PHT): 4.49 cm     TR Peak grad:   30.5 mmHg MV Decel Time: 169 msec     TR Vmax:        276.00 cm/s MV E velocity: 108.00 cm/s MV A velocity: 83.30 cm/s   SHUNTS MV E/A ratio:  1.30         Systemic VTI:  0.18 m                             Systemic Diam: 2.20 cm Sunit Tolia Electronically signed by  Madonna Large Signature Date/Time: 10/05/2023/3:40:31 PM    Final    DG Chest Port 1 View Result Date: 10/04/2023 CLINICAL DATA:  Shortness of breath EXAM: PORTABLE CHEST 1 VIEW COMPARISON:  06/19/2023, 03/18/2023 FINDINGS: Hardware in the cervical spine. Cardiomegaly. Aortic atherosclerosis. Cardiomegaly with vascular congestion and diffuse interstitial and ground-glass opacity, slightly progressive, favor pulmonary edema but atypical infection could also produce this appearance IMPRESSION: Cardiomegaly with vascular congestion and diffuse interstitial and ground-glass opacity, slightly progressive, favor pulmonary edema but atypical infection could also produce this appearance. Electronically Signed   By: Luke Bun M.D.   On: 10/04/2023 21:05   CARDIAC CATHETERIZATION Result Date: 10/04/2023 Table formatting from the original result was not included. Images from the original result were not included. Dominance: Right                                                                                LV Gram (2 - Hypokinesis); EF ~35-40%            Angiographically minimal CAD with no change from prior catheterization. Moderate-severely reduced LVEF (estimated 30 to 35% with global hypokinesis) with EDP of 16 to 18 mmHg. => Nonischemic Cardiomyopathy as previously indicated  RECOMMENDATIONS   Anticipated discharge date to be determined.   The patient will be followed by cardiology but will likely need to be comanaged by nephrology and internal medicine. Will need aggressive blood pressure management.   No indication for antiplatelet therapy at this time . Alm Clay, MD   Scheduled Meds:  amLODipine   10 mg Oral Daily   aspirin  EC  81 mg Oral Q1500   Chlorhexidine  Gluconate Cloth  6 each Topical Daily   Chlorhexidine  Gluconate Cloth  6 each Topical Q0600   doxazosin   4 mg Oral QHS   fluticasone  furoate-vilanterol  1 puff Inhalation Daily   heparin   5,000 Units Subcutaneous Q8H   influenza vac split  trivalent PF  0.5 mL Intramuscular Tomorrow-1000   isosorbide  mononitrate  60 mg Oral Daily   metoprolol  tartrate  50 mg Oral BID   multivitamin  1 tablet Oral Daily   pantoprazole   40 mg Oral Daily   pravastatin   40 mg Oral QHS   sevelamer  carbonate  4.8 g Oral TID WC   sodium chloride  flush  3 mL Intravenous Q12H   traZODone   50  mg Oral QHS   Continuous Infusions:  azithromycin  Stopped (10/05/23 1153)   cefTRIAXone  (ROCEPHIN )  IV Stopped (10/05/23 0916)     LOS: 0 days   Fredia Skeeter, MD [facility]  10/06/2023, 8:51 AM   *Please note that this is a verbal dictation therefore any spelling or grammatical errors are due to the Dragon Medical One system interpretation.  Please page via Amion and do not message via secure chat for urgent patient care matters. Secure chat can be used for non urgent patient care matters.  How to contact the TRH Attending or Consulting provider 7A - 7P or covering provider during after hours 7P -7A, for this patient?  Check the care team in [facility] and look for a) attending/consulting TRH provider listed and b) the TRH team listed. Page or secure chat 7A-7P. Log into [email or web address] and use [place]'s universal password to access. If you do not have the password, please contact the hospital operator. Locate the TRH provider you are looking for under [facility] and page to a number that you can be directly reached. If you still have difficulty reaching the provider, please page the [facility] (Director on Call) for the Hospitalists listed on amion for assistance.

## 2023-10-06 NOTE — Progress Notes (Signed)
 PT placed on CPAP at this time.   10/06/23 2200  BiPAP/CPAP/SIPAP  BiPAP/CPAP/SIPAP Pt Type Adult  BiPAP/CPAP/SIPAP Resmed  Mask Type Full face mask  Dentures removed? Not applicable  Mask Size Large  Respiratory Rate 17 breaths/min  Flow Rate 3 lpm  Minute Ventilation 14  Leak 18  Tidal Volume (Vt) 691  Patient Home Machine No  Patient Home Mask No  Patient Home Tubing No  Auto Titrate No  Nasal massage performed Yes  CPAP/SIPAP surface wiped down Yes  Device Plugged into RED Power Outlet Yes

## 2023-10-06 NOTE — Plan of Care (Signed)
  Problem: Education: Goal: Knowledge of General Education information will improve Description: Including pain rating scale, medication(s)/side effects and non-pharmacologic comfort measures Outcome: Progressing   Problem: Health Behavior/Discharge Planning: Goal: Ability to manage health-related needs will improve Outcome: Progressing   Problem: Coping: Goal: Level of anxiety will decrease Outcome: Progressing   Problem: Elimination: Goal: Will not experience complications related to bowel motility Outcome: Progressing   Problem: Pain Managment: Goal: General experience of comfort will improve and/or be controlled Outcome: Progressing

## 2023-10-06 NOTE — Progress Notes (Signed)
 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   Due to concerns for possible anterior wall MI patient was taken to the Cath Lab for coronary angiography. Patient was noted to have minimal CAD and normal LVEDP.  Plan was to admit to medicine due to unknown origin of fever, would need nephrology consult for HD, and cardiology to follow as consult.   Interval Summary   Resting in bed comfortably. No anginal chest pain or heart failure symptoms Has Shortness of breath but not volume overload On nasal cannula oxygen  Plans on HD today.  And on precaution secondary to rhinovirus being detected  Vital Signs Vitals:   10/06/23 0026 10/06/23 0211 10/06/23 0400 10/06/23 0832  BP: (!) 100/50  122/65 128/64  Pulse: 66 62 68 68  Resp: 20 15 20 19   Temp: 98.4 F (36.9 C)  97.8 F (36.6 C) 97.9 F (36.6 C)  TempSrc: Oral  Axillary Axillary  SpO2: 96% 96% 98% 99%  Weight:   84.8 kg   Height:        Intake/Output Summary (Last 24 hours) at 10/06/2023 1105 Last data filed at 10/05/2023 2000 Gross per 24 hour  Intake 707 ml  Output --  Net 707 ml      10/06/2023    4:00 AM 10/04/2023    8:14 PM 07/03/2023    8:31 AM  Last 3 Weights  Weight (lbs) 186 lb 15.2 oz 185 lb 6.5 oz 182 lb 9.6 oz  Weight (kg) 84.8 kg 84.1 kg 82.827 kg      Telemetry/ECG  Sinus rhythm - Personally Reviewed  Physical Exam  GEN: No acute distress.  Appears older than stated age.  Hemodynamically stable Neck: No JVD Cardiac: RRR, +4/6 systolic ejection murmur at 2RICS, holosystolic murmur at apex,  no rubs, or  gallops.  Respiratory: Clear to auscultation bilaterally.  On nasal cannula oxygen  GI: Soft, nontender, non-distended  MS: No edema  Lab Results  Component Value Date   WBC 1.7 (L) 10/06/2023   HGB 6.9 (LL) 10/06/2023   HCT 21.7 (L) 10/06/2023   MCV 97.7 10/06/2023   PLT 69 (L) 10/06/2023   Lab Results  Component Value Date   HGBA1C 5.2 01/29/2023    Lab Results  Component Value Date   CHOL 114 05/05/2022   HDL 41 05/05/2022   LDLCALC 56 05/05/2022   TRIG 86 05/05/2022   CHOLHDL 2.8 05/05/2022      Latest Ref Rng & Units 10/05/2023    4:47 AM 10/04/2023    7:25 PM 10/04/2023    7:24 PM  BMP  Glucose 70 - 99 mg/dL 887   98   BUN 8 - 23 mg/dL 16   11   Creatinine 9.38 - 1.24 mg/dL 3.21   4.59   Sodium 864 - 145 mmol/L 136  137  136   Potassium 3.5 - 5.1 mmol/L 3.6  3.1  3.0   Chloride 98 - 111 mmol/L 92   92   CO2 22 - 32 mmol/L 30     Calcium  8.9 - 10.3 mg/dL 7.9      Assessment & Plan  Nonischemic cardiomyopathy Left catheterization  10/04/2023: Minimal nonobstructive CAD. Echo February 2025: LVEF 45 to 50%, global hypokinesis, grade 2 diastolic dysfunction, elevated PAP at 60 mmHg GDMT limited secondary to ESRD Echo 09/2023 : LVEF 25-30%, severe global hypokinesis, grade 2 diastolic dysfunction, elevated filling pressures, moderate to severe aortic stenosis (consider severe low-flow low gradient AAS) and mitral regurgitation. Underlying cardiomyopathy likely secondary to hypertensive heart disease, further exacerbated by possible current rhinovirus infection. BNP 2277 Lactic acid within normal limits. Check fasting lipids. Currently on amlodipine  10 mg p.o. daily.  Will discontinue Currently on Imdur  60 mg p.o. daily. Start hydralazine  25 mg p.o. twice daily with holding parameters. Currently on Lopressor  50 mg p.o. twice daily. Currently on pravastatin  40 mg p.o. daily. Currently on aspirin  81 mg p.o. daily Would recommend transitioning Lopressor  to 50 mg p.o.  twice daily to Toprol -XL For volume management for nephrology as per dialysis Will reevaluate the room for up titration of GDMT after his dialysis session  Aortic stenosis: Most recent echocardiogram notes at least moderate to severe aortic stenosis, suspect he has severe low-flow low gradient AS. Needs to be reevaluated after his acute viral illnesses as outpatient.  Recommend outpatient follow-up Will need to reevaluate blood indices to make sure pancytopenia resolved.  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 Blood pressures closer to arrival 173/81. Medications as discussed above Reemphasized importance of low-salt diet. Reemphasized importance of dialysis compliance  Management per primary: Pancytopenia  Anemia Acute febrile illness - afebrile over last 24hr. Rhinovirus  Signed, Kirk Large, DO, FACC Kirk [name]  A Division of [facility] [address] [place], [place] [address]

## 2023-10-06 NOTE — Progress Notes (Signed)
 [facility] Progress Note   Subjective:   Patient seen and examined at bedside.  Reports shortness of breath this morning.  Did not sleep well overnight.  Chest pain overall better, having intermittent pains.  Denies abdominal pain, nausea, vomiting and diarrhea.  After speak a sentence he stops to take a few deep breaths before starting to talk again.   Objective Vitals:   10/05/23 2355 10/06/23 0026 10/06/23 0211 10/06/23 0400  BP:  (!) 100/50  122/65  Pulse: 66 66 62 68  Resp: 19 20 15 20   Temp:  98.4 F (36.9 C)  97.8 F (36.6 C)  TempSrc:  Oral  Axillary  SpO2:  96% 96% 98%  Weight:    84.8 kg  Height:       Physical Exam General:chronically ill appearing male  Heart:RRR Lungs:+crackles bilaterally, increased WOB with speech on 2L O2 Abdomen:soft, NTND Extremities:no LE edema Dialysis Access: LU AVF   Filed Weights   10/04/23 2014 10/06/23 0400  Weight: 84.1 kg 84.8 kg    Intake/Output Summary (Last 24 hours) at 10/06/2023 0741 Last data filed at 10/05/2023 2000 Gross per 24 hour  Intake 1187 ml  Output --  Net 1187 ml    Additional Objective Labs: Basic Metabolic Panel: Recent Labs  Lab 10/04/23 1855 10/04/23 1924 10/04/23 1925 10/05/23 0447  NA 136 136 137 136  K 3.0* 3.0* 3.1* 3.6  CL 93* 92*  --  92*  CO2 30  --   --  30  GLUCOSE 93 98  --  112*  BUN 10 11  --  16  CREATININE 5.33* 5.40*  --  6.78*  CALCIUM  8.3*  --   --  7.9*  PHOS  --   --   --  4.8*   Liver Function Tests: Recent Labs  Lab 10/04/23 1855 10/05/23 0447  AST 15  --   ALT 10  --   ALKPHOS 65  --   BILITOT 0.9  --   PROT 7.0  --   ALBUMIN  3.3* 2.9*   No results for input(s): LIPASE, AMYLASE in the last 168 hours. CBC: Recent Labs  Lab 10/04/23 1855 10/04/23 1924 10/04/23 2049 10/05/23 0447 10/06/23 0503  WBC 3.2*  --  3.0* 2.3* 2.5*  NEUTROABS 2.4  --   --   --  1.5*  HGB 9.4*   < > 8.2* 7.8* 7.1*  HCT 29.6*   < > 25.4* 24.0* 22.6*  MCV 95.8   --  95.1 95.2 97.4  PLT 78*  --  76* 78* 86*   < > = values in this interval not displayed.   Blood Culture    Component Value Date/Time   SDES BLOOD RIGHT ANTECUBITAL 10/04/2023 2042   SDES BLOOD RIGHT HAND 10/04/2023 2042   SPECREQUEST  10/04/2023 2042    BOTTLES DRAWN AEROBIC AND ANAEROBIC Blood Culture adequate volume   SPECREQUEST  10/04/2023 2042    BOTTLES DRAWN AEROBIC AND ANAEROBIC Blood Culture results may not be optimal due to an inadequate volume of blood received in culture bottles   CULT  10/04/2023 2042    NO GROWTH < 12 HOURS Performed at [facility], [address], [place], [place] [address]    CULT  10/04/2023 2042    NO GROWTH < 12 HOURS Performed at [facility], [address], [place], [place] [address]    REPTSTATUS PENDING 10/04/2023 2042   REPTSTATUS PENDING 10/04/2023 2042  Studies/Results: ECHOCARDIOGRAM COMPLETE Result Date: 10/05/2023    ECHOCARDIOGRAM REPORT   Patient Name:   Kirk [name] Date of Exam: 10/05/2023 Medical Rec #:  [id number]       Height:       67.0 in Accession #:    [id number]      Weight:       185.4 lb Date of Birth:  [birth year]        BSA:          1.958 m Patient Age:    63 years        BP:           100/60 mmHg Patient Gender: M               HR:           74 bpm. Exam Location:  Inpatient Procedure: 2D Echo, Cardiac Doppler and Color Doppler (Both Spectral and Color            Flow Doppler were utilized during procedure). Indications:    Nonischemic Cardiomyopathy I42.8  History:        Patient has no prior history of Echocardiogram examinations,                 most recent 03/18/2023. CHF and Cardiomyopathy, COPD and ESRD,                 Arrythmias:Atrial Fibrillation, Signs/Symptoms:Hypotension and                 Chest Pain; Risk Factors:Hypertension, Sleep Apnea, Diabetes and                 Dyslipidemia.  Sonographer:    Thea Norlander RCS Referring Phys: ALM ORN HARDING IMPRESSIONS  1. Left ventricular ejection  fraction, by estimation, is 25 to 30%. The left ventricle has severely decreased function. The left ventricle demonstrates global hypokinesis. Left ventricular diastolic parameters are consistent with Grade II diastolic dysfunction (pseudonormalization). Elevated left atrial pressure. The E/e' is 19.5.  2. Right ventricular systolic function is normal. The right ventricular size is normal. There is moderately elevated pulmonary artery systolic pressure. The estimated right ventricular systolic pressure is 45.5 mmHg.  3. Left atrial size was moderately dilated.  4. Right atrial size was moderately dilated.  5. Moderate mitral subvalvular calcification.  6. The mitral valve is degenerative. Mild to moderate mitral valve regurgitation.  7. The aortic valve was not well visualized. Aortic valve regurgitation is not visualized. Moderate to severe aortic valve stenosis (peak velocity 3.92m/s, MG , AVA VTI 1.04cm2, SVi 35, DI 0.27) consider severe Low flow low gradient AS.  8. The inferior vena cava is dilated in size with <50% respiratory variability, suggesting right atrial pressure of 15 mmHg. Comparison(s): A prior study was performed on 03/18/2023. LVEF 45-50%, global hypokinesis, G2DD, RVSP 59.33mmHg, moderate LAE, mild RAE, mild to moderate AS, RAP . FINDINGS  Left Ventricle: Left ventricular ejection fraction, by estimation, is 25 to 30%. The left ventricle has severely decreased function. The left ventricle demonstrates global hypokinesis. The left ventricular internal cavity size was normal in size. There is borderline left ventricular hypertrophy. Left ventricular diastolic parameters are consistent with Grade II diastolic dysfunction (pseudonormalization). Elevated left atrial pressure. The E/e' is 19.5. Right Ventricle: The right ventricular size is normal. No increase in right ventricular wall thickness. Right ventricular systolic function is normal. There is moderately elevated pulmonary artery  systolic pressure. The tricuspid regurgitant velocity is 2.76 m/s,  and with an assumed right atrial pressure of 15 mmHg, the estimated right ventricular systolic pressure is 45.5 mmHg. Left Atrium: Left atrial size was moderately dilated. Right Atrium: Right atrial size was moderately dilated. Pericardium: There is no evidence of pericardial effusion. Mitral Valve: The mitral valve is degenerative in appearance. Mild to moderate mitral annular calcification. Moderate mitral subvalvular calcification. Mild to moderate mitral valve regurgitation. Tricuspid Valve: The tricuspid valve is not well visualized. Tricuspid valve regurgitation is trivial. No evidence of tricuspid stenosis. Aortic Valve: The aortic valve was not well visualized. Aortic valve regurgitation is not visualized. Moderate to severe aortic valve stenosis (peak velocity 3.83m/s, MG , AVA VTI 1.04cm2, SVi 35, DI 0.27) consider severe Low flow low gradient AS. Aortic valve mean gradient measures 22.0 mmHg. Aortic valve peak gradient measures 38.7 mmHg. Aortic valve area, by VTI measures 1.04 cm. Pulmonic Valve: The pulmonic valve was normal in structure. Pulmonic valve regurgitation is not visualized. No evidence of pulmonic stenosis. Aorta: The aortic root and ascending aorta are structurally normal, with no evidence of dilitation. Venous: The inferior vena cava is dilated in size with less than 50% respiratory variability, suggesting right atrial pressure of 15 mmHg. IAS/Shunts: The atrial septum is grossly normal.  LEFT VENTRICLE PLAX 2D LVIDd:         5.60 cm   Diastology LVIDs:         4.30 cm   LV e' medial:    5.00 cm/s LV PW:         1.30 cm   LV E/e' medial:  21.6 LV IVS:        1.10 cm   LV e' lateral:   6.53 cm/s LVOT diam:     2.20 cm   LV E/e' lateral: 16.5 LV SV:         69 LV SV Index:   35 LVOT Area:     3.80 cm  RIGHT VENTRICLE             IVC RV S prime:     14.00 cm/s  IVC diam: 2.80 cm TAPSE (M-mode): 2.4 cm LEFT ATRIUM              Index        RIGHT ATRIUM           Index LA diam:        4.30 cm 2.20 cm/m   RA Area:     26.90 cm LA Vol (A2C):   88.3 ml 45.09 ml/m  RA Volume:   93.30 ml  47.65 ml/m LA Vol (A4C):   77.6 ml 39.63 ml/m LA Biplane Vol: 83.4 ml 42.59 ml/m  AORTIC VALVE AV Area (Vmax):    1.11 cm AV Area (Vmean):   1.16 cm AV Area (VTI):     1.04 cm AV Vmax:           311.00 cm/s AV Vmean:          204.333 cm/s AV VTI:            0.661 m AV Peak Grad:      38.7 mmHg AV Mean Grad:      22.0 mmHg LVOT Vmax:         91.20 cm/s LVOT Vmean:        62.533 cm/s LVOT VTI:          0.181 m LVOT/AV VTI ratio: 0.27  AORTA Ao Root diam: 3.10 cm Ao Asc diam:  3.30  cm MITRAL VALVE                TRICUSPID VALVE MV Area (PHT): 4.49 cm     TR Peak grad:   30.5 mmHg MV Decel Time: 169 msec     TR Vmax:        276.00 cm/s MV E velocity: 108.00 cm/s MV A velocity: 83.30 cm/s   SHUNTS MV E/A ratio:  1.30         Systemic VTI:  0.18 m                             Systemic Diam: 2.20 cm Sunit Tolia Electronically signed by Madonna Large Signature Date/Time: 10/05/2023/3:40:31 PM    Final    DG Chest Port 1 View Result Date: 10/04/2023 CLINICAL DATA:  Shortness of breath EXAM: PORTABLE CHEST 1 VIEW COMPARISON:  06/19/2023, 03/18/2023 FINDINGS: Hardware in the cervical spine. Cardiomegaly. Aortic atherosclerosis. Cardiomegaly with vascular congestion and diffuse interstitial and ground-glass opacity, slightly progressive, favor pulmonary edema but atypical infection could also produce this appearance IMPRESSION: Cardiomegaly with vascular congestion and diffuse interstitial and ground-glass opacity, slightly progressive, favor pulmonary edema but atypical infection could also produce this appearance. Electronically Signed   By: Luke Bun M.D.   On: 10/04/2023 21:05   CARDIAC CATHETERIZATION Result Date: 10/04/2023 Table formatting from the original result was not included. Images from the original result were not included. Dominance:  Right                                                                                LV Gram (2 - Hypokinesis); EF ~35-40%            Angiographically minimal CAD with no change from prior catheterization. Moderate-severely reduced LVEF (estimated 30 to 35% with global hypokinesis) with EDP of 16 to 18 mmHg. => Nonischemic Cardiomyopathy as previously indicated  RECOMMENDATIONS   Anticipated discharge date to be determined.   The patient will be followed by cardiology but will likely need to be comanaged by nephrology and internal medicine. Will need aggressive blood pressure management.   No indication for antiplatelet therapy at this time . Alm Clay, MD   Medications:  azithromycin  Stopped (10/05/23 1153)   cefTRIAXone  (ROCEPHIN )  IV Stopped (10/05/23 0916)    amLODipine   10 mg Oral Daily   aspirin  EC  81 mg Oral Q1500   Chlorhexidine  Gluconate Cloth  6 each Topical Daily   Chlorhexidine  Gluconate Cloth  6 each Topical Q0600   doxazosin   4 mg Oral QHS   fluticasone  furoate-vilanterol  1 puff Inhalation Daily   heparin   5,000 Units Subcutaneous Q8H   influenza vac split trivalent PF  0.5 mL Intramuscular Tomorrow-1000   isosorbide  mononitrate  60 mg Oral Daily   metoprolol  tartrate  50 mg Oral BID   multivitamin  1 tablet Oral Daily   pantoprazole   40 mg Oral Daily   pravastatin   40 mg Oral QHS   sevelamer  carbonate  4.8 g Oral TID WC   sodium chloride  flush  3 mL Intravenous Q12H   traZODone   50 mg Oral QHS  Dialysis Orders: MWF - GO  4.25hrs, BFR 400, DFR 700,  EDW 84kg, 2K/ 2Ca   Access: AVF  Heparin  2500 unit bolus Mircera 150 mcg q2wks - last 9/10 Venofer  50mg  qwk Calcitriol  2.25mcg qHD Sensipar  120mg  qHD   Assessment/Plan:  Chest pain - heart cath with minimal CAD and normal LVEDP. Improved today. Rhinovirus - positive on respiratory panel.  Per PMD Shortness of breath - pulmonary edema on CXR, crackles on exam. On 2L O2. Plan for extra HD today for volume  removal.   ESRD -  on MWF.  Extra HD today.   Hypertension/volume  - Blood pressure in goal. Cardiology adjusting medications. Currently on amlodipine  10mg  every day, Imdur  60mg  every day and lopressor  50mg  BID. Does not have edema but crackles on exam.  Plan for extra HD for volume removal.   Anemia of CKD - Hgb 7.1. ESA recently dosed. Transfuse prn for Hgb<7.   Secondary Hyperparathyroidism -  Corrected Ca and phos in goal.  Continue VDRA, sensipar  and binders.   Nutrition - Renal diet w/fluid restrictions.  Manuelita Labella, PA-C [facility] 10/06/2023,7:41 AM  LOS: 0 days

## 2023-10-06 NOTE — Progress Notes (Signed)
   10/06/23 1453  Vitals  Temp 98.2 F (36.8 C)  Pulse Rate 72  Resp (!) 21  BP (!) 144/69  SpO2 97 %  O2 Device Nasal Cannula  Weight 81.8 kg  Type of Weight Post-Dialysis  Oxygen  Therapy  O2 Flow Rate (L/min) 2 L/min  Post Treatment  Dialyzer Clearance Lightly streaked  Hemodialysis Intake (mL) 0 mL  Liters Processed 72  Fluid Removed (mL) 3000 mL  Tolerated HD Treatment Yes  AVG/AVF Arterial Site Held (minutes) 5 minutes  AVG/AVF Venous Site Held (minutes) 5 minutes   Received patient in bed to unit.  Alert and oriented.  Informed consent signed and in chart.   TX duration:3.0  Patient tolerated well.  Transported back to the room  Alert, without acute distress.  Hand-off given to patient's nurse.   Access used: LLAF Access issues: no complications  Total UF removed: 3000 Medication(s) given: none Transfused 1 unit PRBCs --no s/s of any transfusion reactions---blood volume was removed  [name]

## 2023-10-07 ENCOUNTER — Other Ambulatory Visit (HOSPITAL_COMMUNITY): Payer: Self-pay

## 2023-10-07 DIAGNOSIS — I160 Hypertensive urgency: Secondary | ICD-10-CM | POA: Diagnosis not present

## 2023-10-07 DIAGNOSIS — Z992 Dependence on renal dialysis: Secondary | ICD-10-CM | POA: Diagnosis not present

## 2023-10-07 DIAGNOSIS — I5043 Acute on chronic combined systolic (congestive) and diastolic (congestive) heart failure: Secondary | ICD-10-CM | POA: Diagnosis not present

## 2023-10-07 DIAGNOSIS — N186 End stage renal disease: Secondary | ICD-10-CM | POA: Diagnosis not present

## 2023-10-07 DIAGNOSIS — I350 Nonrheumatic aortic (valve) stenosis: Secondary | ICD-10-CM | POA: Diagnosis not present

## 2023-10-07 DIAGNOSIS — I110 Hypertensive heart disease with heart failure: Secondary | ICD-10-CM | POA: Diagnosis not present

## 2023-10-07 DIAGNOSIS — I5030 Unspecified diastolic (congestive) heart failure: Secondary | ICD-10-CM | POA: Diagnosis not present

## 2023-10-07 LAB — CBC
HCT: 25.4 % — ABNORMAL LOW (ref 39.0–52.0)
Hemoglobin: 8.1 g/dL — ABNORMAL LOW (ref 13.0–17.0)
MCH: 30.9 pg (ref 26.0–34.0)
MCHC: 31.9 g/dL (ref 30.0–36.0)
MCV: 96.9 fL (ref 80.0–100.0)
Platelets: 79 K/uL — ABNORMAL LOW (ref 150–400)
RBC: 2.62 MIL/uL — ABNORMAL LOW (ref 4.22–5.81)
RDW: 15.8 % — ABNORMAL HIGH (ref 11.5–15.5)
WBC: 2.4 K/uL — ABNORMAL LOW (ref 4.0–10.5)
nRBC: 0 % (ref 0.0–0.2)

## 2023-10-07 LAB — BPAM RBC
Blood Product Expiration Date: 202510092359
ISSUE DATE / TIME: 202509141321
Unit Type and Rh: 6200

## 2023-10-07 LAB — RENAL FUNCTION PANEL
Albumin: 2.9 g/dL — ABNORMAL LOW (ref 3.5–5.0)
Anion gap: 14 (ref 5–15)
BUN: 27 mg/dL — ABNORMAL HIGH (ref 8–23)
CO2: 29 mmol/L (ref 22–32)
Calcium: 9 mg/dL (ref 8.9–10.3)
Chloride: 92 mmol/L — ABNORMAL LOW (ref 98–111)
Creatinine, Ser: 8.02 mg/dL — ABNORMAL HIGH (ref 0.61–1.24)
GFR, Estimated: 7 mL/min — ABNORMAL LOW (ref >60)
Glucose, Bld: 102 mg/dL — ABNORMAL HIGH (ref 70–99)
Phosphorus: 5.5 mg/dL — ABNORMAL HIGH (ref 2.5–4.6)
Potassium: 4.1 mmol/L (ref 3.5–5.1)
Sodium: 135 mmol/L (ref 135–145)

## 2023-10-07 LAB — TYPE AND SCREEN
ABO/RH(D): A POS
Antibody Screen: NEGATIVE
Unit division: 0

## 2023-10-07 LAB — LIPID PANEL
Cholesterol: 92 mg/dL (ref 0–200)
HDL: 23 mg/dL — ABNORMAL LOW (ref >40)
LDL Cholesterol: 44 mg/dL (ref 0–99)
Total CHOL/HDL Ratio: 4 ratio
Triglycerides: 126 mg/dL (ref <150)
VLDL: 25 mg/dL (ref 0–40)

## 2023-10-07 LAB — LIPOPROTEIN A (LPA): Lipoprotein (a): 54 nmol/L — ABNORMAL HIGH (ref <75.0)

## 2023-10-07 LAB — HEPATITIS B SURFACE ANTIBODY, QUANTITATIVE: Hep B S AB Quant (Post): 11.3 m[IU]/mL

## 2023-10-07 MED ORDER — LIDOCAINE-PRILOCAINE 2.5-2.5 % EX CREA: 1.0000 | TOPICAL_CREAM | CUTANEOUS | Status: DC | PRN

## 2023-10-07 MED ORDER — AZITHROMYCIN 250 MG PO TABS
500.0000 mg | ORAL_TABLET | Freq: Every day | ORAL | Status: DC
  Filled 2023-10-07: qty 2

## 2023-10-07 MED ORDER — HEPARIN SODIUM (PORCINE) 1000 UNIT/ML DIALYSIS: 1000.0000 [IU] | INTRAMUSCULAR | Status: DC | PRN

## 2023-10-07 MED ORDER — METOPROLOL SUCCINATE ER 25 MG PO TB24
25.0000 mg | ORAL_TABLET | Freq: Every day | ORAL | 0 refills | Status: DC
  Filled 2023-10-07: qty 30, 30d supply, fill #0

## 2023-10-07 MED ORDER — AMOXICILLIN-POT CLAVULANATE 500-125 MG PO TABS
1.0000 | ORAL_TABLET | Freq: Every day | ORAL | 0 refills | Status: AC
  Filled 2023-10-07: qty 5, 5d supply, fill #0

## 2023-10-07 MED ORDER — METOPROLOL TARTRATE 50 MG PO TABS
50.0000 mg | ORAL_TABLET | Freq: Two times a day (BID) | ORAL | Status: DC
  Administered 2023-10-07: 50 mg via ORAL
  Filled 2023-10-07: qty 1

## 2023-10-07 MED ORDER — METOPROLOL SUCCINATE ER 100 MG PO TB24: 100.0000 mg | ORAL_TABLET | Freq: Every day | ORAL | Status: DC

## 2023-10-07 MED ORDER — LIDOCAINE HCL (PF) 1 % IJ SOLN: 5.0000 mL | INTRAMUSCULAR | Status: DC | PRN

## 2023-10-07 MED ORDER — CHLORHEXIDINE GLUCONATE CLOTH 2 % EX PADS
6.0000 | MEDICATED_PAD | Freq: Every day | CUTANEOUS | Status: DC
  Administered 2023-10-08: 6 via TOPICAL

## 2023-10-07 MED ORDER — PENTAFLUOROPROP-TETRAFLUOROETH EX AERO: 1.0000 | INHALATION_SPRAY | CUTANEOUS | Status: DC | PRN

## 2023-10-07 MED ORDER — METOPROLOL SUCCINATE ER 25 MG PO TB24
25.0000 mg | ORAL_TABLET | Freq: Every day | ORAL | Status: DC
  Filled 2023-10-07 (×2): qty 1

## 2023-10-07 MED ORDER — HYDRALAZINE HCL 25 MG PO TABS
25.0000 mg | ORAL_TABLET | Freq: Three times a day (TID) | ORAL | Status: DC
  Administered 2023-10-08 (×2): 25 mg via ORAL
  Filled 2023-10-07 (×2): qty 1

## 2023-10-07 NOTE — Progress Notes (Addendum)
 Pt receives out-pt HD at [facility], MWF 1120 chair time. D/c orders noted. Contacted out-pt unit to inform of pt d/c and anticipated arrival Wednesday.   Lavanda Manroop Jakubowicz Dialysis Navigator [phone]

## 2023-10-07 NOTE — TOC CM/SW Note (Signed)
 Transition of Care [facility]) - Inpatient Brief Assessment   Patient Details  Name: Kirk [name] MRN: [id number] Date of Birth: 16-Apr-1960  Transition of Care [facility]) CM/SW Contact:    Sudie Erminio Deems, RN Phone Number: 10/07/2023, 11:41 AM   Clinical Narrative: Patient presented for chest pain. PTA patient was from home with spouse. Patient has DME oxygen  in the home via [employer]. Patient had concerns that oxygen  was not working in the home. Inpatient Case Manager received verbal permission to call the spouse to discuss. Spouse states the patient has a home fill station and a cut is in the oxygen  line; now it is without any oxygen  supply. Wife states for the portable tanks the charger is not working. Service ticket placed with Liaison Zack with [employer].   Per Liaison, a technician visit should occur today. [employer] Liaison will deliver two portable tanks to the room today. MD is aware of plan of care. [employer] Liaison called ICM back and the patient has a concentrator in the home along with the home fill station. Technician to educate the patient and spouse on how to utilize. No further needs identified at this time.     Transition of Care Asessment: Insurance and Status: Insurance coverage has been reviewed Patient has primary care physician: Yes Home environment has been reviewed: reviewed Prior level of function:: reviewed Prior/Current Home Services: No current home services Social Drivers of Health Review: SDOH reviewed no interventions necessary Readmission risk has been reviewed: Yes Transition of care needs: no transition of care needs at this time

## 2023-10-07 NOTE — Progress Notes (Signed)
 Heart Failure Navigator Progress Note  Assessed for Heart & Vascular TOC clinic readiness.  Patient does not meet criteria due to ESRD on hemodialysis. No HF TOC.   Navigator will sign off at this time.   Randie Bustle, BSN, Scientist, clinical (histocompatibility and immunogenetics) Only

## 2023-10-07 NOTE — Progress Notes (Signed)
 TOC meds delivered to patient and placed on bedside table per pt request.

## 2023-10-07 NOTE — Progress Notes (Signed)
 PHARMACIST - PHYSICIAN COMMUNICATION  CONCERNING: Antibiotic IV to Oral Route Change Policy  RECOMMENDATION: This patient is receiving azithrmomycin by the intravenous route.  Based on criteria approved by the Pharmacy and Therapeutics Committee, the antibiotic(s) is/are being converted to the equivalent oral dose form(s).   DESCRIPTION: These criteria include: Patient being treated for a respiratory tract infection, urinary tract infection, cellulitis or clostridium difficile associated diarrhea if on metronidazole  The patient is not neutropenic and does not exhibit a GI malabsorption state The patient is eating (either orally or via tube) and/or has been taking other orally administered medications for a least 24 hours The patient is improving clinically and has a Tmax < 100.5  If you have questions about this conversion, please contact the Pharmacy Department  []   [phone] )  [facility] []   [phone] )  [facility] [x]   [phone] )  [facility] []   [phone] )  [facility] []   [phone] )  [facility]ra [facility]ner[facility] Lionel     Prentice Poisson, PharmD Clinical Pharmacist **Pharmacist phone directory can now be found on [email or web address] ([email or web address]).  Listed under [facility].

## 2023-10-07 NOTE — Discharge Summary (Signed)
 Physician Discharge Summary  [name]:[id number] DOB: [birth year] DOA: 10/04/2023  PCP: Leonarda Roxan [name], NP  Admit date: 10/04/2023 Discharge date: 10/07/2023    Admitted From: Home Disposition: Home  Recommendations for Outpatient Follow-up:  Follow up with PCP in 1-2 weeks Please obtain BMP/CBC in one week Follow-up with your cardiologist on 10/16/2023. Please follow up with your PCP on the following pending results: Unresulted Labs (From admission, onward)     Start     Ordered   10/07/23 0701  CBC  Daily,   R     Question:  Specimen collection method  Answer:  Lab=Lab collect   10/07/23 0700   10/07/23 0701  Renal function panel  Daily,   R     Question:  Specimen collection method  Answer:  Lab=Lab collect   10/07/23 0700   10/07/23 0500  Lipoprotein A (LPA)  Tomorrow morning,   R       Question:  Specimen collection method  Answer:  Lab=Lab collect   10/06/23 1116   10/06/23 0740  Hepatitis B surface antibody,quantitative  (New Admission Hemo Labs (Hepatitis B))  Once,   R       Question:  Specimen collection method  Answer:  Lab=Lab collect   10/06/23 0741              Home Health: None Equipment/Devices: Oxygen   Discharge Condition: Stable CODE STATUS: Full code Diet recommendation:  Diet Order             Diet Heart Room service appropriate? Yes; Fluid consistency: Thin; Fluid restriction: 1200 mL Fluid  Diet effective now                   Subjective: Patient seen and examined, he says that he is feeling much better since he had dialysis yesterday.  He feels back to baseline.  Currently on 3 L of oxygen .  He is in agreement with going home.  Brief/Interim Summary: Kirk [name] is a 63 year old with ESRD on HD [name] failed renal transplant), hypertension, combined systolic and diastolic heart failure, gout, hyperlipidemia, OSA on CPAP who  was admitted by cardiology on 10/04/2023 for the evaluation of possible ST elevation  MI.  Patient was taken directly to the Cath Lab and catheterization showed minimal CAD with no change from prior cath but did show reduced EF to 35-40%.  TRH consulted to help manage chronic medical problems and address low-grade fever.  After that, TRH assumed care of the patient as primary service for cardiology recommend and cardiology followed as consultant.   # Chest pain - Presented with acute onset of constant, dull and nonradiating chest pain - Taken straight to the Cath Lab due to evidence of ST elevation on EKG - No significant CAD found on heart catheterization - Chest pain likely secondary to accelerated hypertension and CHF exacerbation which has resolved now.  Cardiology followed during hospitalization.   # Acute on chronic combined systolic and diastolic HF # Chronic hypoxic respiratory failure - Uses 2 L of oxygen  at baseline.  Last TTE in 2//25 showed EF 45 to 50%, LV global hypokinesis, G2DD, moderately elevated PASP, and moderately dilated LA - LHC on admission showed moderate-severe reduced EF to 35-40% - Patient with signs of CHF exacerbation on exam but no significant shortness of breath or hypoxia.  Repeat echo shows EF of 25 to 50% with grade 2 diastolic dysfunction.  Patient does not make urine, volume overload and CHF  was treated with hemodialysis.  Due to reduced EF, amlodipine  discontinued and his metoprolol  transition to Toprol -XL by cardiology.   # ESRD on HD - On Monday Wednesday Friday HD, last HD session prior to admission on 10/04/2023. - He was fluid overloaded, nephrology consulted, he had extra hemodialysis on 10/06/2023.  Patient is going to receive his routine dialysis today and then will go home and resume his dialysis outpatient on MWF schedule.   # Hypertensive urgency rule out/history of hypertension: - Patient found to have SBP in the 170s on admission.  Did not meet criteria for hypertensive urgency.  Patient's home medications including Lopressor , Imdur ,  amlodipine  and Cardura .  Resumed Imdur  and Cardura  as well as Lopressor  and blood pressure improved.   # Fever/viral (rhinovirus) and possibly bacterial community-acquired pneumonia: - Patient found to have fever 100.8 on admission, reported mild shortness of breath but no cough. X-ray does show some diffuse interstitial and ground glass opacities favoring pulmonary edema but cannot rule out atypical infection.  Afebrile since then, has mild leukopenia.  Negative for COVID, influenza or RSV but positive for rhinovirus.  Based on the personal review of the x-ray, likely viral pneumonia but bacterial superinfection cannot be ruled out.  Treated symptomatically as well as with Rocephin  and Zithromax .  Feeling much better, he is requiring 3 L of oxygen  which is his baseline.  He feels comfortable going home.  Sending him home on Augmentin /renally dosed for next 5 days.   # Hypokalemia Replenished and resolved   # HLD - Continue pravastatin    # GERD - Continue Protonix    # OSA - Continue CPAP at bedtime   anemia of chronic disease: Baseline hemoglobin between 8-9.  Down to 6.9 on 10/06/2023 for which she received 1 unit of PBC transfusion, hemoglobin 8.1 today.    Discharge plan was discussed with patient and/or family member and they verbalized understanding and agreed with it.  Discharge Diagnoses:  Principal Problem:   Accelerated hypertension with diastolic congestive heart failure, NYHA class 3 (HCC) Active Problems:   CAP (community acquired pneumonia)   ESRD on dialysis (HCC)   Fever   Hypertensive urgency   Acute on chronic combined systolic and diastolic CHF (congestive heart failure) (HCC)   Precordial chest pain   Nonischemic cardiomyopathy (HCC)   Benign hypertensive kidney disease with chronic kidney disease   Rhinovirus infection   Acute febrile illness   Nonrheumatic aortic valve stenosis    Discharge Instructions   Allergies as of 10/07/2023       Reactions    Codeine Nausea And Vomiting   Hydrocodone -acetaminophen  Nausea And Vomiting        Medication List    Some of the medications listed here do not show instructions, such as how often to take the medication. Ask your doctor or nurse how to use these medications. Specifically ask about this and similar medications: fluticasone -salmeterol (ADVAIR HFA) 45-21 MCG/ACT inhaler    STOP taking these medications    amLODipine  10 MG tablet Commonly known as: NORVASC    DULoxetine  60 MG capsule Commonly known as: CYMBALTA    metoprolol  tartrate 50 MG tablet Commonly known as: LOPRESSOR    pantoprazole  40 MG tablet Commonly known as: Protonix    predniSONE  10 MG (21) Tbpk tablet Commonly known as: STERAPRED UNI-PAK 21 TAB       TAKE these medications    acetaminophen  500 MG tablet Commonly known as: TYLENOL  Take 1 tablet (500 mg total) by mouth every 6 (six) hours as  needed for moderate pain. What changed:  how much to take reasons to take this   albuterol  108 (90 Base) MCG/ACT inhaler Commonly known as: VENTOLIN  HFA Inhale 2 puffs into the lungs every 6 (six) hours as needed for wheezing or shortness of breath.   albuterol  (2.5 MG/3ML) 0.083% nebulizer solution Commonly known as: PROVENTIL  Take 3 mLs (2.5 mg total) by nebulization every 4 (four) hours as needed for wheezing or shortness of breath.   amoxicillin -clavulanate 500-125 MG tablet Commonly known as: Augmentin  Take 1 tablet by mouth at bedtime for 5 days.   aspirin  EC 81 MG tablet Take 81 mg by mouth daily in the afternoon.   doxazosin  4 MG tablet Commonly known as: CARDURA  Take 1 tablet (4 mg total) by mouth at bedtime.   fluticasone -salmeterol 45-21 MCG/ACT inhaler Commonly known as: Advair HFA __________________________   guaiFENesin  600 MG 12 hr tablet Commonly known as: MUCINEX  Take 1,200 mg by mouth 2 (two) times daily as needed for cough or to loosen phlegm.   hydrALAZINE  50 MG tablet Commonly known  as: APRESOLINE  Take 25 mg by mouth 3 (three) times daily.   ipratropium-albuterol  0.5-2.5 (3) MG/3ML Soln Commonly known as: DUONEB Take 3 mLs by nebulization every 6 (six) hours. Please take every 6 hours scheduled for next 5 days then as needed.   isosorbide  mononitrate 60 MG 24 hr tablet Commonly known as: IMDUR  Take 60 mg by mouth daily.   metoprolol  succinate 25 MG 24 hr tablet Commonly known as: TOPROL -XL Take 1 tablet (25 mg total) by mouth daily.   multivitamin Tabs tablet Take 1 tablet by mouth daily. What changed:  when to take this additional instructions   OXYGEN  Inhale into the lungs. 2 L  cont.   pravastatin  40 MG tablet Commonly known as: PRAVACHOL  TAKE 1 TABLET BY MOUTH EVERY DAY IN THE EVENING What changed:  how much to take how to take this when to take this additional instructions   sevelamer  carbonate 2.4 g Pack Commonly known as: RENVELA  Take 4.8 g by mouth 3 (three) times daily with meals. Also takes with his snacks   traMADol  50 MG tablet Commonly known as: ULTRAM  Take 50 mg by mouth daily as needed for severe pain (pain score 7-10).   traZODone  50 MG tablet Commonly known as: DESYREL  TAKE 0.5-1 TABLET (25-50 MG TOTAL) BY MOUTH AT BEDTIME AS NEEDED. FOR SLEEP What changed: See the new instructions.   UNABLE TO FIND Inhale 1 Device into the lungs at bedtime. CPAP   Velphoro  500 MG chewable tablet Generic drug: sucroferric oxyhydroxide Chew 1,000-1,500 mg by mouth See admin instructions. Take 3 tablets (1500mg ) by mouth three times daily with meals and take 2 tablets (1000mg ) with snacks.   Vitamin D3 25 MCG (1000 UT) Caps Take 4,000 Units by mouth See admin instructions. Patient takes Earlean Everts, Sat, Sun        Follow-up Information     Janene Boer, [place] Follow up.   Specialties: Cardiology, Radiology Why: Hospital follow-up with Cardiology scheduled for 10/16/2023 at 10:55am at our [facility] office. Please arrive 20 minutes early  for check-in. If this date/ time does not work for you, please call our office to reschedule. Contact information: [address] [place] [address] [phone]         Ngetich, Dinah C, NP Follow up in 1 week(s).   Specialty: Family Medicine Contact information: [address] [place] [address] [phone]  Allergies  Allergen Reactions   Codeine Nausea And Vomiting   Hydrocodone -Acetaminophen  Nausea And Vomiting    Consultations: Cardiology and nephrology   Procedures/Studies: ECHOCARDIOGRAM COMPLETE Result Date: 10/05/2023    ECHOCARDIOGRAM REPORT   Patient Name:   Kirk [name] Date of Exam: 10/05/2023 Medical Rec #:  [id number]       Height:       67.0 in Accession #:    [id number]      Weight:       185.4 lb Date of Birth:  Jul 17, 1960        BSA:          1.958 m Patient Age:    63 years        BP:           100/60 mmHg Patient Gender: M               HR:           74 bpm. Exam Location:  Inpatient Procedure: 2D Echo, Cardiac Doppler and Color Doppler (Both Spectral and Color            Flow Doppler were utilized during procedure). Indications:    Nonischemic Cardiomyopathy I42.8  History:        Patient has no prior history of Echocardiogram examinations,                 most recent 03/18/2023. CHF and Cardiomyopathy, COPD and ESRD,                 Arrythmias:Atrial Fibrillation, Signs/Symptoms:Hypotension and                 Chest Pain; Risk Factors:Hypertension, Sleep Apnea, Diabetes and                 Dyslipidemia.  Sonographer:    Thea Norlander RCS Referring Phys: ALM ORN HARDING IMPRESSIONS  1. Left ventricular ejection fraction, by estimation, is 25 to 30%. The left ventricle has severely decreased function. The left ventricle demonstrates global hypokinesis. Left ventricular diastolic parameters are consistent with Grade II diastolic dysfunction (pseudonormalization). Elevated left atrial pressure. The E/e' is 19.5.  2. Right ventricular  systolic function is normal. The right ventricular size is normal. There is moderately elevated pulmonary artery systolic pressure. The estimated right ventricular systolic pressure is 45.5 mmHg.  3. Left atrial size was moderately dilated.  4. Right atrial size was moderately dilated.  5. Moderate mitral subvalvular calcification.  6. The mitral valve is degenerative. Mild to moderate mitral valve regurgitation.  7. The aortic valve was not well visualized. Aortic valve regurgitation is not visualized. Moderate to severe aortic valve stenosis (peak velocity 3.19m/s, MG , AVA VTI 1.04cm2, SVi 35, DI 0.27) consider severe Low flow low gradient AS.  8. The inferior vena cava is dilated in size with <50% respiratory variability, suggesting right atrial pressure of 15 mmHg. Comparison(s): A prior study was performed on 03/18/2023. LVEF 45-50%, global hypokinesis, G2DD, RVSP 59.83mmHg, moderate LAE, mild RAE, mild to moderate AS, RAP . FINDINGS  Left Ventricle: Left ventricular ejection fraction, by estimation, is 25 to 30%. The left ventricle has severely decreased function. The left ventricle demonstrates global hypokinesis. The left ventricular internal cavity size was normal in size. There is borderline left ventricular hypertrophy. Left ventricular diastolic parameters are consistent with Grade II diastolic dysfunction (pseudonormalization). Elevated left atrial pressure. The E/e' is 19.5. Right Ventricle: The right ventricular size is normal. No increase in right  ventricular wall thickness. Right ventricular systolic function is normal. There is moderately elevated pulmonary artery systolic pressure. The tricuspid regurgitant velocity is 2.76 m/s, and with an assumed right atrial pressure of 15 mmHg, the estimated right ventricular systolic pressure is 45.5 mmHg. Left Atrium: Left atrial size was moderately dilated. Right Atrium: Right atrial size was moderately dilated. Pericardium: There is no evidence  of pericardial effusion. Mitral Valve: The mitral valve is degenerative in appearance. Mild to moderate mitral annular calcification. Moderate mitral subvalvular calcification. Mild to moderate mitral valve regurgitation. Tricuspid Valve: The tricuspid valve is not well visualized. Tricuspid valve regurgitation is trivial. No evidence of tricuspid stenosis. Aortic Valve: The aortic valve was not well visualized. Aortic valve regurgitation is not visualized. Moderate to severe aortic valve stenosis (peak velocity 3.43m/s, MG , AVA VTI 1.04cm2, SVi 35, DI 0.27) consider severe Low flow low gradient AS. Aortic valve mean gradient measures 22.0 mmHg. Aortic valve peak gradient measures 38.7 mmHg. Aortic valve area, by VTI measures 1.04 cm. Pulmonic Valve: The pulmonic valve was normal in structure. Pulmonic valve regurgitation is not visualized. No evidence of pulmonic stenosis. Aorta: The aortic root and ascending aorta are structurally normal, with no evidence of dilitation. Venous: The inferior vena cava is dilated in size with less than 50% respiratory variability, suggesting right atrial pressure of 15 mmHg. IAS/Shunts: The atrial septum is grossly normal.  LEFT VENTRICLE PLAX 2D LVIDd:         5.60 cm   Diastology LVIDs:         4.30 cm   LV e' medial:    5.00 cm/s LV PW:         1.30 cm   LV E/e' medial:  21.6 LV IVS:        1.10 cm   LV e' lateral:   6.53 cm/s LVOT diam:     2.20 cm   LV E/e' lateral: 16.5 LV SV:         69 LV SV Index:   35 LVOT Area:     3.80 cm  RIGHT VENTRICLE             IVC RV S prime:     14.00 cm/s  IVC diam: 2.80 cm TAPSE (M-mode): 2.4 cm LEFT ATRIUM             Index        RIGHT ATRIUM           Index LA diam:        4.30 cm 2.20 cm/m   RA Area:     26.90 cm LA Vol (A2C):   88.3 ml 45.09 ml/m  RA Volume:   93.30 ml  47.65 ml/m LA Vol (A4C):   77.6 ml 39.63 ml/m LA Biplane Vol: 83.4 ml 42.59 ml/m  AORTIC VALVE AV Area (Vmax):    1.11 cm AV Area (Vmean):   1.16 cm AV  Area (VTI):     1.04 cm AV Vmax:           311.00 cm/s AV Vmean:          204.333 cm/s AV VTI:            0.661 m AV Peak Grad:      38.7 mmHg AV Mean Grad:      22.0 mmHg LVOT Vmax:         91.20 cm/s LVOT Vmean:        62.533 cm/s LVOT VTI:  0.181 m LVOT/AV VTI ratio: 0.27  AORTA Ao Root diam: 3.10 cm Ao Asc diam:  3.30 cm MITRAL VALVE                TRICUSPID VALVE MV Area (PHT): 4.49 cm     TR Peak grad:   30.5 mmHg MV Decel Time: 169 msec     TR Vmax:        276.00 cm/s MV E velocity: 108.00 cm/s MV A velocity: 83.30 cm/s   SHUNTS MV E/A ratio:  1.30         Systemic VTI:  0.18 m                             Systemic Diam: 2.20 cm Sunit Tolia Electronically signed by Madonna Large Signature Date/Time: 10/05/2023/3:40:31 PM    Final    DG Chest Port 1 View Result Date: 10/04/2023 CLINICAL DATA:  Shortness of breath EXAM: PORTABLE CHEST 1 VIEW COMPARISON:  06/19/2023, 03/18/2023 FINDINGS: Hardware in the cervical spine. Cardiomegaly. Aortic atherosclerosis. Cardiomegaly with vascular congestion and diffuse interstitial and ground-glass opacity, slightly progressive, favor pulmonary edema but atypical infection could also produce this appearance IMPRESSION: Cardiomegaly with vascular congestion and diffuse interstitial and ground-glass opacity, slightly progressive, favor pulmonary edema but atypical infection could also produce this appearance. Electronically Signed   By: Luke Bun M.D.   On: 10/04/2023 21:05   CARDIAC CATHETERIZATION Result Date: 10/04/2023 Table formatting from the original result was not included. Images from the original result were not included. Dominance: Right                                                                                LV Gram (2 - Hypokinesis); EF ~35-40%            Angiographically minimal CAD with no change from prior catheterization. Moderate-severely reduced LVEF (estimated 30 to 35% with global hypokinesis) with EDP of 16 to 18 mmHg. => Nonischemic  Cardiomyopathy as previously indicated  RECOMMENDATIONS   Anticipated discharge date to be determined.   The patient will be followed by cardiology but will likely need to be comanaged by nephrology and internal medicine. Will need aggressive blood pressure management.   No indication for antiplatelet therapy at this time . Alm Clay, MD    Discharge Exam: Vitals:   10/07/23 0846 10/07/23 0848  BP: 120/60   Pulse: 63   Resp: 18 18  Temp: 97.6 F (36.4 C)   SpO2: 100%    Vitals:   10/06/23 2119 10/06/23 2357 10/07/23 0846 10/07/23 0848  BP: (!) 145/69 138/71 120/60   Pulse: 73 67 63   Resp: (!) 24 18 18 18   Temp: 97.6 F (36.4 C) 98.3 F (36.8 C) 97.6 F (36.4 C)   TempSrc: Oral Oral Oral   SpO2: 98% 100% 100%   Weight:      Height:        General: Pt is alert, awake, not in acute distress Cardiovascular: RRR, S1/S2 +, no rubs, no gallops Respiratory: CTA bilaterally, no wheezing, no rhonchi Abdominal: Soft, NT, ND, bowel sounds + Extremities: no edema, no  cyanosis    The results of significant diagnostics from this hospitalization (including imaging, microbiology, ancillary and laboratory) are listed below for reference.     Microbiology: Recent Results (from the past 240 hours)  Blood culture (routine x 2)     Status: None (Preliminary result)   Collection Time: 10/04/23  8:42 PM   Specimen: BLOOD  Result Value Ref Range Status   Specimen Description BLOOD RIGHT ANTECUBITAL  Final   Special Requests   Final    BOTTLES DRAWN AEROBIC AND ANAEROBIC Blood Culture adequate volume   Culture   Final    NO GROWTH 3 DAYS Performed at [facility], [address], [place], [place] [address]    Report Status PENDING  Incomplete  Blood culture (routine x 2)     Status: None (Preliminary result)   Collection Time: 10/04/23  8:42 PM   Specimen: BLOOD RIGHT HAND  Result Value Ref Range Status   Specimen Description BLOOD RIGHT HAND  Final   Special Requests    Final    BOTTLES DRAWN AEROBIC AND ANAEROBIC Blood Culture results may not be optimal due to an inadequate volume of blood received in culture bottles   Culture   Final    NO GROWTH 3 DAYS Performed at [facility], [address], [place], [place] [address]    Report Status PENDING  Incomplete  Resp panel by RT-PCR (RSV, Flu A&B, Covid) Anterior Nasal Swab     Status: None   Collection Time: 10/05/23  4:00 AM   Specimen: Anterior Nasal Swab  Result Value Ref Range Status   SARS Coronavirus 2 by RT PCR NEGATIVE NEGATIVE Final   Influenza A by PCR NEGATIVE NEGATIVE Final   Influenza B by PCR NEGATIVE NEGATIVE Final    Comment: (NOTE) The Xpert Xpress SARS-CoV-2/FLU/RSV plus assay is intended as an aid in the diagnosis of influenza from Nasopharyngeal swab specimens and should not be used as a sole basis for treatment. Nasal washings and aspirates are unacceptable for Xpert Xpress SARS-CoV-2/FLU/RSV testing.  Fact Sheet for Patients: BloggerCourse.com  Fact Sheet for Healthcare Providers: SeriousBroker.it  This test is not yet approved or cleared by the [place]  FDA and has been authorized for detection and/or diagnosis of SARS-CoV-2 by FDA under an Emergency Use Authorization (EUA). This EUA will remain in effect (meaning this test can be used) for the duration of the COVID-19 declaration under Section 564(b)(1) of the Act, 21 U.S.C. section 360bbb-3(b)(1), unless the authorization is terminated or revoked.     Resp Syncytial Virus by PCR NEGATIVE NEGATIVE Final    Comment: (NOTE) Fact Sheet for Patients: BloggerCourse.com  Fact Sheet for Healthcare Providers: SeriousBroker.it  This test is not yet approved or cleared by the [place]  FDA and has been authorized for detection and/or diagnosis of SARS-CoV-2 by FDA under an Emergency Use Authorization (EUA). This  EUA will remain in effect (meaning this test can be used) for the duration of the COVID-19 declaration under Section 564(b)(1) of the Act, 21 U.S.C. section 360bbb-3(b)(1), unless the authorization is terminated or revoked.  Performed at [facility], [address], [place], [place] [address]   Respiratory (~20 pathogens) panel by PCR     Status: Abnormal   Collection Time: 10/05/23  4:00 AM   Specimen: Nasopharyngeal Swab; Respiratory  Result Value Ref Range Status   Adenovirus NOT DETECTED NOT DETECTED Final   Coronavirus 229E NOT DETECTED NOT DETECTED Final  Comment: (NOTE) The Coronavirus on the Respiratory Panel, DOES NOT test for the novel  Coronavirus (2019 nCoV)    Coronavirus HKU1 NOT DETECTED NOT DETECTED Final   Coronavirus NL63 NOT DETECTED NOT DETECTED Final   Coronavirus OC43 NOT DETECTED NOT DETECTED Final   Metapneumovirus NOT DETECTED NOT DETECTED Final   Rhinovirus / Enterovirus DETECTED (A) NOT DETECTED Final   Influenza A NOT DETECTED NOT DETECTED Final   Influenza B NOT DETECTED NOT DETECTED Final   Parainfluenza Virus 1 NOT DETECTED NOT DETECTED Final   Parainfluenza Virus 2 NOT DETECTED NOT DETECTED Final   Parainfluenza Virus 3 NOT DETECTED NOT DETECTED Final   Parainfluenza Virus 4 NOT DETECTED NOT DETECTED Final   Respiratory Syncytial Virus NOT DETECTED NOT DETECTED Final   Bordetella pertussis NOT DETECTED NOT DETECTED Final   Bordetella Parapertussis NOT DETECTED NOT DETECTED Final   Chlamydophila pneumoniae NOT DETECTED NOT DETECTED Final   Mycoplasma pneumoniae NOT DETECTED NOT DETECTED Final    Comment: Performed at [facility], [address] [place], [place] [address]  MRSA Next Gen by PCR, Nasal     Status: None   Collection Time: 10/05/23  4:00 AM   Specimen: Nasal Mucosa; Nasal Swab  Result Value Ref Range Status   MRSA by PCR Next Gen NOT DETECTED NOT DETECTED Final    Comment: (NOTE) The GeneXpert MRSA Assay (FDA  approved for NASAL specimens only), is one component of a comprehensive MRSA colonization surveillance program. It is not intended to diagnose MRSA infection nor to guide or monitor treatment for MRSA infections. Test performance is not FDA approved in patients less than 14 years old. Performed at [facility], [address], [place], [place] [address]      Labs: BNP (last 3 results) Recent Labs    03/16/23 1152 06/19/23 1606 10/04/23 1855  BNP 1,610.6* 1,727.0* 2,277.3*   Basic Metabolic Panel: Recent Labs  Lab 10/04/23 1855 10/04/23 1924 10/04/23 1925 10/05/23 0447 10/06/23 1033 10/07/23 0524  NA 136 136 137 136 132* 135  K 3.0* 3.0* 3.1* 3.6 4.1 4.1  CL 93* 92*  --  92* 92* 92*  CO2 30  --   --  30 27 29   GLUCOSE 93 98  --  112* 108* 102*  BUN 10 11  --  16 35* 27*  CREATININE 5.33* 5.40*  --  6.78* 10.18* 8.02*  CALCIUM  8.3*  --   --  7.9* 8.2* 9.0  MG  --   --   --  1.8  --   --   PHOS  --   --   --  4.8* 6.4* 5.5*   Liver Function Tests: Recent Labs  Lab 10/04/23 1855 10/05/23 0447 10/06/23 1033 10/07/23 0524  AST 15  --   --   --   ALT 10  --   --   --   ALKPHOS 65  --   --   --   BILITOT 0.9  --   --   --   PROT 7.0  --   --   --   ALBUMIN  3.3* 2.9* 2.7* 2.9*   No results for input(s): LIPASE, AMYLASE in the last 168 hours. No results for input(s): AMMONIA in the last 168 hours. CBC: Recent Labs  Lab 10/04/23 1855 10/04/23 1924 10/04/23 2049 10/05/23 0447 10/06/23 0503 10/06/23 1033 10/07/23 0524  WBC 3.2*  --  3.0* 2.3* 2.5* 1.7* 2.4*  NEUTROABS 2.4  --   --   --  1.5*  --   --   HGB 9.4*   < > 8.2* 7.8* 7.1* 6.9* 8.1*  HCT 29.6*   < > 25.4* 24.0* 22.6* 21.7* 25.4*  MCV 95.8  --  95.1 95.2 97.4 97.7 96.9  PLT 78*  --  76* 78* 86* 69* 79*   < > = values in this interval not displayed.   Cardiac Enzymes: No results for input(s): CKTOTAL, CKMB, CKMBINDEX, TROPONINI in the last 168 hours. BNP: Invalid input(s):  POCBNP CBG: No results for input(s): GLUCAP in the last 168 hours. D-Dimer No results for input(s): DDIMER in the last 72 hours. Hgb A1c No results for input(s): HGBA1C in the last 72 hours. Lipid Profile Recent Labs    10/07/23 0524  CHOL 92  HDL 23*  LDLCALC 44  TRIG 873  CHOLHDL 4.0   Thyroid  function studies No results for input(s): TSH, T4TOTAL, T3FREE, THYROIDAB in the last 72 hours.  Invalid input(s): FREET3 Anemia work up No results for input(s): VITAMINB12, FOLATE, FERRITIN, TIBC, IRON, RETICCTPCT in the last 72 hours. Urinalysis    Component Value Date/Time   COLORURINE YELLOW 02/19/2017 1520   APPEARANCEUR HAZY (A) 02/19/2017 1520   LABSPEC 1.012 02/19/2017 1520   PHURINE 8.0 02/19/2017 1520   GLUCOSEU 50 (A) 02/19/2017 1520   HGBUR MODERATE (A) 02/19/2017 1520   BILIRUBINUR NEGATIVE 02/19/2017 1520   KETONESUR NEGATIVE 02/19/2017 1520   PROTEINUR >=300 (A) 02/19/2017 1520   UROBILINOGEN 0.2 04/06/2014 1030   NITRITE NEGATIVE 02/19/2017 1520   LEUKOCYTESUR MODERATE (A) 02/19/2017 1520   Sepsis Labs Recent Labs  Lab 10/05/23 0447 10/06/23 0503 10/06/23 1033 10/07/23 0524  WBC 2.3* 2.5* 1.7* 2.4*   Microbiology Recent Results (from the past 240 hours)  Blood culture (routine x 2)     Status: None (Preliminary result)   Collection Time: 10/04/23  8:42 PM   Specimen: BLOOD  Result Value Ref Range Status   Specimen Description BLOOD RIGHT ANTECUBITAL  Final   Special Requests   Final    BOTTLES DRAWN AEROBIC AND ANAEROBIC Blood Culture adequate volume   Culture   Final    NO GROWTH 3 DAYS Performed at [facility], [address], [place], [place] [address]    Report Status PENDING  Incomplete  Blood culture (routine x 2)     Status: None (Preliminary result)   Collection Time: 10/04/23  8:42 PM   Specimen: BLOOD RIGHT HAND  Result Value Ref Range Status   Specimen Description BLOOD RIGHT HAND  Final    Special Requests   Final    BOTTLES DRAWN AEROBIC AND ANAEROBIC Blood Culture results may not be optimal due to an inadequate volume of blood received in culture bottles   Culture   Final    NO GROWTH 3 DAYS Performed at [facility], [address] [place], [place] [address]    Report Status PENDING  Incomplete  Resp panel by RT-PCR (RSV, Flu A&B, Covid) Anterior Nasal Swab     Status: None   Collection Time: 10/05/23  4:00 AM   Specimen: Anterior Nasal Swab  Result Value Ref Range Status   SARS Coronavirus 2 by RT PCR NEGATIVE NEGATIVE Final   Influenza A by PCR NEGATIVE NEGATIVE Final   Influenza B by PCR NEGATIVE NEGATIVE Final    Comment: (NOTE) The Xpert Xpress SARS-CoV-2/FLU/RSV plus assay is intended as an aid in the diagnosis of influenza from Nasopharyngeal swab specimens and should not  be used as a sole basis for treatment. Nasal washings and aspirates are unacceptable for Xpert Xpress SARS-CoV-2/FLU/RSV testing.  Fact Sheet for Patients: BloggerCourse.com  Fact Sheet for Healthcare Providers: SeriousBroker.it  This test is not yet approved or cleared by the [place]  FDA and has been authorized for detection and/or diagnosis of SARS-CoV-2 by FDA under an Emergency Use Authorization (EUA). This EUA will remain in effect (meaning this test can be used) for the duration of the COVID-19 declaration under Section 564(b)(1) of the Act, 21 U.S.C. section 360bbb-3(b)(1), unless the authorization is terminated or revoked.     Resp Syncytial Virus by PCR NEGATIVE NEGATIVE Final    Comment: (NOTE) Fact Sheet for Patients: BloggerCourse.com  Fact Sheet for Healthcare Providers: SeriousBroker.it  This test is not yet approved or cleared by the [place]  FDA and has been authorized for detection and/or diagnosis of SARS-CoV-2 by FDA under an Emergency Use  Authorization (EUA). This EUA will remain in effect (meaning this test can be used) for the duration of the COVID-19 declaration under Section 564(b)(1) of the Act, 21 U.S.C. section 360bbb-3(b)(1), unless the authorization is terminated or revoked.  Performed at [facility], [address], [place], [place] [address]   Respiratory (~20 pathogens) panel by PCR     Status: Abnormal   Collection Time: 10/05/23  4:00 AM   Specimen: Nasopharyngeal Swab; Respiratory  Result Value Ref Range Status   Adenovirus NOT DETECTED NOT DETECTED Final   Coronavirus 229E NOT DETECTED NOT DETECTED Final    Comment: (NOTE) The Coronavirus on the Respiratory Panel, DOES NOT test for the novel  Coronavirus (2019 nCoV)    Coronavirus HKU1 NOT DETECTED NOT DETECTED Final   Coronavirus NL63 NOT DETECTED NOT DETECTED Final   Coronavirus OC43 NOT DETECTED NOT DETECTED Final   Metapneumovirus NOT DETECTED NOT DETECTED Final   Rhinovirus / Enterovirus DETECTED (A) NOT DETECTED Final   Influenza A NOT DETECTED NOT DETECTED Final   Influenza B NOT DETECTED NOT DETECTED Final   Parainfluenza Virus 1 NOT DETECTED NOT DETECTED Final   Parainfluenza Virus 2 NOT DETECTED NOT DETECTED Final   Parainfluenza Virus 3 NOT DETECTED NOT DETECTED Final   Parainfluenza Virus 4 NOT DETECTED NOT DETECTED Final   Respiratory Syncytial Virus NOT DETECTED NOT DETECTED Final   Bordetella pertussis NOT DETECTED NOT DETECTED Final   Bordetella Parapertussis NOT DETECTED NOT DETECTED Final   Chlamydophila pneumoniae NOT DETECTED NOT DETECTED Final   Mycoplasma pneumoniae NOT DETECTED NOT DETECTED Final    Comment: Performed at [facility], [address] [place], [place] [address]  MRSA Next Gen by PCR, Nasal     Status: None   Collection Time: 10/05/23  4:00 AM   Specimen: Nasal Mucosa; Nasal Swab  Result Value Ref Range Status   MRSA by PCR Next Gen NOT DETECTED NOT DETECTED Final    Comment: (NOTE) The  GeneXpert MRSA Assay (FDA approved for NASAL specimens only), is one component of a comprehensive MRSA colonization surveillance program. It is not intended to diagnose MRSA infection nor to guide or monitor treatment for MRSA infections. Test performance is not FDA approved in patients less than 12 years old. Performed at [facility], [address], [place], [place] [address]     FURTHER DISCHARGE INSTRUCTIONS:   Get Medicines reviewed and adjusted: Please take all your medications with you for your next visit with your Primary MD   Laboratory/radiological data: Please  request your Primary MD to go over all hospital tests and procedure/radiological results at the follow up, please ask your Primary MD to get all Hospital records sent to his/her office.   In some cases, they will be blood work, cultures and biopsy results pending at the time of your discharge. Please request that your primary care M.D. goes through all the records of your hospital data and follows up on these results.   Also Note the following: If you experience worsening of your admission symptoms, develop shortness of breath, life threatening emergency, suicidal or homicidal thoughts you must seek medical attention immediately by calling 911 or calling your MD immediately  if symptoms less severe.   You must read complete instructions/literature along with all the possible adverse reactions/side effects for all the Medicines you take and that have been prescribed to you. Take any new Medicines after you have completely understood and accpet all the possible adverse reactions/side effects.    patient was instructed, not to drive, operate heavy machinery, perform activities at heights, swimming or participation in water  activities or provide baby-sitting services while on Pain, Sleep and Anxiety Medications; until their outpatient Physician has advised to do so again. Also recommended to not to take more than  prescribed Pain, Sleep and Anxiety Medications.  It is not advisable to combine anxiety, sleep and pain medications without talking with your primary care provider.     Wear Seat belts while driving.   Please note: You were cared for by a hospitalist during your hospital stay. Once you are discharged, your primary care physician will handle any further medical issues. Please note that NO REFILLS for any discharge medications will be authorized once you are discharged, as it is imperative that you return to your primary care physician (or establish a relationship with a primary care physician if you do not have one) for your post hospital discharge needs so that they can reassess your need for medications and monitor your lab values  Time coordinating discharge: Over 30 minutes  SIGNED:   Fredia Skeeter, MD  [facility] 10/07/2023, 11:28 AM *Please note that this is a verbal dictation therefore any spelling or grammatical errors are due to the Dragon Medical One system interpretation. If 7PM-7AM, please contact night-coverage [email or web address]

## 2023-10-07 NOTE — Progress Notes (Signed)
 Pharmacist Heart Failure Core Measure Documentation  Assessment: Kirk [name] has an EF documented as 25-30% on 10/05/23 by Echo.  Rationale: Heart failure patients with left ventricular systolic dysfunction (LVSD) and an EF < 40% should be prescribed an angiotensin converting enzyme inhibitor (ACEI) or angiotensin receptor blocker (ARB) at discharge unless a contraindication is documented in the medical record.  This patient is not currently on an ACEI or ARB for HF.  This note is being placed in the record in order to provide documentation that a contraindication to the use of these agents is present for this encounter.  ACE Inhibitor or Angiotensin Receptor Blocker is contraindicated (specify all that apply)  []   ACEI allergy AND ARB allergy []   Angioedema []   Moderate or severe aortic stenosis []   Hyperkalemia []   Hypotension []   Renal artery stenosis [x]   Worsening renal function, preexisting renal disease or dysfunction  Prentice Poisson, PharmD Clinical Pharmacist **Pharmacist phone directory can now be found on [email or web address] ([email or web address]).  Listed under [facility].

## 2023-10-07 NOTE — Plan of Care (Signed)

## 2023-10-07 NOTE — Progress Notes (Signed)
 [facility] Progress Note   Subjective:  Seen in room - feeling ok today, intermittent dyspnea. No CP or fever. S/p extra HD 9/14 with 3L off, due for regular HD today, then looks like will be discharged after. I did ask him about doing outpatient HD tomorrow, but he prefers to have HD inpatient today.  Objective Vitals:   10/06/23 2119 10/06/23 2357 10/07/23 0846 10/07/23 0848  BP: (!) 145/69 138/71 120/60   Pulse: 73 67 63   Resp: (!) 24 18 18 18   Temp: 97.6 F (36.4 C) 98.3 F (36.8 C) 97.6 F (36.4 C)   TempSrc: Oral Oral Oral   SpO2: 98% 100% 100%   Weight:      Height:       Physical Exam General: Well appearing, NAD. Cpap in place - just waking from sleep Heart: RRR Lungs: CTAB; no rales Abdomen: soft Extremities: no LE edema Dialysis Access: LUE AVF +t/b  Additional Objective Labs: Basic Metabolic Panel: Recent Labs  Lab 10/05/23 0447 10/06/23 1033 10/07/23 0524  NA 136 132* 135  K 3.6 4.1 4.1  CL 92* 92* 92*  CO2 30 27 29   GLUCOSE 112* 108* 102*  BUN 16 35* 27*  CREATININE 6.78* 10.18* 8.02*  CALCIUM  7.9* 8.2* 9.0  PHOS 4.8* 6.4* 5.5*   Liver Function Tests: Recent Labs  Lab 10/04/23 1855 10/05/23 0447 10/06/23 1033 10/07/23 0524  AST 15  --   --   --   ALT 10  --   --   --   ALKPHOS 65  --   --   --   BILITOT 0.9  --   --   --   PROT 7.0  --   --   --   ALBUMIN  3.3* 2.9* 2.7* 2.9*   CBC: Recent Labs  Lab 10/04/23 1855 10/04/23 1924 10/04/23 2049 10/05/23 0447 10/06/23 0503 10/06/23 1033 10/07/23 0524  WBC 3.2*  --  3.0* 2.3* 2.5* 1.7* 2.4*  NEUTROABS 2.4  --   --   --  1.5*  --   --   HGB 9.4*   < > 8.2* 7.8* 7.1* 6.9* 8.1*  HCT 29.6*   < > 25.4* 24.0* 22.6* 21.7* 25.4*  MCV 95.8  --  95.1 95.2 97.4 97.7 96.9  PLT 78*  --  76* 78* 86* 69* 79*   < > = values in this interval not displayed.   Blood Culture    Component Value Date/Time   SDES BLOOD RIGHT ANTECUBITAL 10/04/2023 2042   SDES BLOOD RIGHT HAND  10/04/2023 2042   SPECREQUEST  10/04/2023 2042    BOTTLES DRAWN AEROBIC AND ANAEROBIC Blood Culture adequate volume   SPECREQUEST  10/04/2023 2042    BOTTLES DRAWN AEROBIC AND ANAEROBIC Blood Culture results may not be optimal due to an inadequate volume of blood received in culture bottles   CULT  10/04/2023 2042    NO GROWTH 3 DAYS Performed at [facility], [address], [place], [place] [address]    CULT  10/04/2023 2042    NO GROWTH 3 DAYS Performed at [facility], [address] [place], [place] [address]    REPTSTATUS PENDING 10/04/2023 2042   REPTSTATUS PENDING 10/04/2023 2042   Studies/Results: ECHOCARDIOGRAM COMPLETE Result Date: 10/05/2023    ECHOCARDIOGRAM REPORT   Patient Name:   Kirk [name] Date of Exam: 10/05/2023 Medical Rec #:  [id number]       Height:  67.0 in Accession #:    [id number]      Weight:       185.4 lb Date of Birth:  Jul 19, 1960        BSA:          1.958 m Patient Age:    63 years        BP:           100/60 mmHg Patient Gender: M               HR:           74 bpm. Exam Location:  Inpatient Procedure: 2D Echo, Cardiac Doppler and Color Doppler (Both Spectral and Color            Flow Doppler were utilized during procedure). Indications:    Nonischemic Cardiomyopathy I42.8  History:        Patient has no prior history of Echocardiogram examinations,                 most recent 03/18/2023. CHF and Cardiomyopathy, COPD and ESRD,                 Arrythmias:Atrial Fibrillation, Signs/Symptoms:Hypotension and                 Chest Pain; Risk Factors:Hypertension, Sleep Apnea, Diabetes and                 Dyslipidemia.  Sonographer:    Thea Norlander RCS Referring Phys: ALM ORN HARDING IMPRESSIONS  1. Left ventricular ejection fraction, by estimation, is 25 to 30%. The left ventricle has severely decreased function. The left ventricle demonstrates global hypokinesis. Left ventricular diastolic parameters are consistent with Grade II diastolic  dysfunction (pseudonormalization). Elevated left atrial pressure. The E/e' is 19.5.  2. Right ventricular systolic function is normal. The right ventricular size is normal. There is moderately elevated pulmonary artery systolic pressure. The estimated right ventricular systolic pressure is 45.5 mmHg.  3. Left atrial size was moderately dilated.  4. Right atrial size was moderately dilated.  5. Moderate mitral subvalvular calcification.  6. The mitral valve is degenerative. Mild to moderate mitral valve regurgitation.  7. The aortic valve was not well visualized. Aortic valve regurgitation is not visualized. Moderate to severe aortic valve stenosis (peak velocity 3.15m/s, MG , AVA VTI 1.04cm2, SVi 35, DI 0.27) consider severe Low flow low gradient AS.  8. The inferior vena cava is dilated in size with <50% respiratory variability, suggesting right atrial pressure of 15 mmHg. Comparison(s): A prior study was performed on 03/18/2023. LVEF 45-50%, global hypokinesis, G2DD, RVSP 59.29mmHg, moderate LAE, mild RAE, mild to moderate AS, RAP . FINDINGS  Left Ventricle: Left ventricular ejection fraction, by estimation, is 25 to 30%. The left ventricle has severely decreased function. The left ventricle demonstrates global hypokinesis. The left ventricular internal cavity size was normal in size. There is borderline left ventricular hypertrophy. Left ventricular diastolic parameters are consistent with Grade II diastolic dysfunction (pseudonormalization). Elevated left atrial pressure. The E/e' is 19.5. Right Ventricle: The right ventricular size is normal. No increase in right ventricular wall thickness. Right ventricular systolic function is normal. There is moderately elevated pulmonary artery systolic pressure. The tricuspid regurgitant velocity is 2.76 m/s, and with an assumed right atrial pressure of 15 mmHg, the estimated right ventricular systolic pressure is 45.5 mmHg. Left Atrium: Left atrial size was  moderately dilated. Right Atrium: Right atrial size was moderately dilated. Pericardium: There is no evidence of pericardial  effusion. Mitral Valve: The mitral valve is degenerative in appearance. Mild to moderate mitral annular calcification. Moderate mitral subvalvular calcification. Mild to moderate mitral valve regurgitation. Tricuspid Valve: The tricuspid valve is not well visualized. Tricuspid valve regurgitation is trivial. No evidence of tricuspid stenosis. Aortic Valve: The aortic valve was not well visualized. Aortic valve regurgitation is not visualized. Moderate to severe aortic valve stenosis (peak velocity 3.39m/s, MG , AVA VTI 1.04cm2, SVi 35, DI 0.27) consider severe Low flow low gradient AS. Aortic valve mean gradient measures 22.0 mmHg. Aortic valve peak gradient measures 38.7 mmHg. Aortic valve area, by VTI measures 1.04 cm. Pulmonic Valve: The pulmonic valve was normal in structure. Pulmonic valve regurgitation is not visualized. No evidence of pulmonic stenosis. Aorta: The aortic root and ascending aorta are structurally normal, with no evidence of dilitation. Venous: The inferior vena cava is dilated in size with less than 50% respiratory variability, suggesting right atrial pressure of 15 mmHg. IAS/Shunts: The atrial septum is grossly normal.  LEFT VENTRICLE PLAX 2D LVIDd:         5.60 cm   Diastology LVIDs:         4.30 cm   LV e' medial:    5.00 cm/s LV PW:         1.30 cm   LV E/e' medial:  21.6 LV IVS:        1.10 cm   LV e' lateral:   6.53 cm/s LVOT diam:     2.20 cm   LV E/e' lateral: 16.5 LV SV:         69 LV SV Index:   35 LVOT Area:     3.80 cm  RIGHT VENTRICLE             IVC RV S prime:     14.00 cm/s  IVC diam: 2.80 cm TAPSE (M-mode): 2.4 cm LEFT ATRIUM             Index        RIGHT ATRIUM           Index LA diam:        4.30 cm 2.20 cm/m   RA Area:     26.90 cm LA Vol (A2C):   88.3 ml 45.09 ml/m  RA Volume:   93.30 ml  47.65 ml/m LA Vol (A4C):   77.6 ml 39.63 ml/m LA  Biplane Vol: 83.4 ml 42.59 ml/m  AORTIC VALVE AV Area (Vmax):    1.11 cm AV Area (Vmean):   1.16 cm AV Area (VTI):     1.04 cm AV Vmax:           311.00 cm/s AV Vmean:          204.333 cm/s AV VTI:            0.661 m AV Peak Grad:      38.7 mmHg AV Mean Grad:      22.0 mmHg LVOT Vmax:         91.20 cm/s LVOT Vmean:        62.533 cm/s LVOT VTI:          0.181 m LVOT/AV VTI ratio: 0.27  AORTA Ao Root diam: 3.10 cm Ao Asc diam:  3.30 cm MITRAL VALVE                TRICUSPID VALVE MV Area (PHT): 4.49 cm     TR Peak grad:   30.5 mmHg MV Decel Time: 169 msec  TR Vmax:        276.00 cm/s MV E velocity: 108.00 cm/s MV A velocity: 83.30 cm/s   SHUNTS MV E/A ratio:  1.30         Systemic VTI:  0.18 m                             Systemic Diam: 2.20 cm Sunit Tolia Electronically signed by Madonna Large Signature Date/Time: 10/05/2023/3:40:31 PM    Final    Medications:  cefTRIAXone  (ROCEPHIN )  IV 2 g (10/07/23 0853)    sodium chloride    Intravenous Once   aspirin  EC  81 mg Oral Q1500   [START ON 10/08/2023] azithromycin   500 mg Oral Daily   Chlorhexidine  Gluconate Cloth  6 each Topical Daily   Chlorhexidine  Gluconate Cloth  6 each Topical Q0600   doxazosin   4 mg Oral QHS   fluticasone  furoate-vilanterol  1 puff Inhalation Daily   heparin   5,000 Units Subcutaneous Q8H   hydrALAZINE   25 mg Oral Q8H   influenza vac split trivalent PF  0.5 mL Intramuscular Tomorrow-1000   isosorbide  mononitrate  60 mg Oral Daily   metoprolol  succinate  25 mg Oral Daily   multivitamin  1 tablet Oral Daily   pantoprazole   40 mg Oral Daily   pravastatin   40 mg Oral QHS   sevelamer  carbonate  4.8 g Oral TID WC   sodium chloride  flush  3 mL Intravenous Q12H   traZODone   50 mg Oral QHS    Dialysis Orders MWF - GO  4.25hrs, BFR 400, DFR 700,  EDW 84kg, 2K/ 2Ca   Access: AVF  Heparin  2500 unit bolus Mircera 150 mcg q2wks - last 9/10 Venofer  50mg  qwk Calcitriol  2.25mcg qHD Sensipar  120mg  qHD   Assessment/Plan:  Chest  pain - heart cath with minimal CAD and normal LVEDP.  Rhinovirus - positive on respiratory panel.  Per PMD Shortness of breath - pulmonary edema on CXR, s/p extra HD Sunday 9/14   ESRD -  on MWF. For HD today per usual schedule then ok to discharge after.  Hypertension/volume  - BP stable, now on BB, hydral/isosorb, doxasozin. B  Anemia of CKD - Hgb 8.1. ESA recently dosed. Transfuse prn for Hgb<7.   Secondary Hyperparathyroidism -  Corrected Ca slightly high, phos in goal.  Continue VDRA, sensipar  and binders.   Nutrition - Renal diet w/fluid restrictions.   Izetta Boehringer, PA-C 10/07/2023, 12:20 PM  [employer]

## 2023-10-07 NOTE — Progress Notes (Signed)
  Progress Note  Patient Name: Kirk [name] Date of Encounter: 10/07/2023 [facility] Cardiologist: Vinie [name], MD   Interval Summary   Resting in bed with CPAP on Reports feeling slightly better Complains of abdominal pain Reports some sharp chest pain while in dialysis   Vital Signs Vitals:   10/06/23 1528 10/06/23 2036 10/06/23 2119 10/06/23 2357  BP: 135/77  (!) 145/69 138/71  Pulse: 73  73 67  Resp: (!) 22  (!) 24 18  Temp: (!) 97.5 F (36.4 C)  97.6 F (36.4 C) 98.3 F (36.8 C)  TempSrc: Oral  Oral Oral  SpO2: 98% 100% 98% 100%  Weight:      Height:        Intake/Output Summary (Last 24 hours) at 10/07/2023 0811 Last data filed at 10/06/2023 2122 Gross per 24 hour  Intake 615 ml  Output 3000 ml  Net -2385 ml      10/06/2023    2:53 PM 10/06/2023   11:24 AM 10/06/2023    4:00 AM  Last 3 Weights  Weight (lbs) 180 lb 5.4 oz 186 lb 15.2 oz 186 lb 15.2 oz  Weight (kg) 81.8 kg 84.8 kg 84.8 kg     Telemetry/ECG  Sinus rhythm, HR 60-70s - Personally Reviewed  Physical Exam  GEN: No acute distress, resting with CPAP on.   Neck: No JVD Cardiac: RRR, + murmur.  Respiratory: decreased breath sounds, wheezing present. GI: Soft, nontender, non-distended  MS: No edema  Assessment & Plan   Nonischemic cardiomyopathy  LHC 09/2023: minimal nonobstructive CAD Echo 09/2023: LVEF 25-30%, global hypokinesis, G2DD, normal RV function, elevated PASP 45.5 mmHg, BAE, dilated IVC GDMT limited given ESRD on HD Continue Imdur  60 mg daily Continue hydralazine  25 mg BID Currently on Lopresor 50 mg BID -- consider consolidation to Toprol  prior to discharge  Continue pravastatin  40 mg daily Continue ASA 81 mg daily  Volume management per dialysis   Hypertension  Suspect cardiomyopathy may be due to hypertensive heart disease Most recent BP 138/71 Medications as above  Discussed importance of low salt diet and dialysis compliance   Moderate to severe aortic  stenosis Mild to moderate mitral regurgitation  Echo 09/2023: LVEF 25-30%, mild to moderate MR, moderate to severe AS Undergoing acute viral illness Reevaluate as outpatient once recovered   Per primary ESRD on HD (M/W/F) Pancytopenia Rhinovirus  Possible PNA Electrolyte disturbances GERD OSA on CPAP   For questions or updates, please contact [facility] Please consult [email or web address] for contact info under         Signed, Waddell DELENA Donath, PA-C

## 2023-10-08 DIAGNOSIS — I5021 Acute systolic (congestive) heart failure: Secondary | ICD-10-CM | POA: Diagnosis not present

## 2023-10-08 DIAGNOSIS — I350 Nonrheumatic aortic (valve) stenosis: Secondary | ICD-10-CM | POA: Diagnosis not present

## 2023-10-08 DIAGNOSIS — I110 Hypertensive heart disease with heart failure: Secondary | ICD-10-CM | POA: Diagnosis not present

## 2023-10-08 DIAGNOSIS — I5030 Unspecified diastolic (congestive) heart failure: Secondary | ICD-10-CM | POA: Diagnosis not present

## 2023-10-08 LAB — CBC
HCT: 28.5 % — ABNORMAL LOW (ref 39.0–52.0)
Hemoglobin: 9.4 g/dL — ABNORMAL LOW (ref 13.0–17.0)
MCH: 31.5 pg (ref 26.0–34.0)
MCHC: 33 g/dL (ref 30.0–36.0)
MCV: 95.6 fL (ref 80.0–100.0)
Platelets: 100 K/uL — ABNORMAL LOW (ref 150–400)
RBC: 2.98 MIL/uL — ABNORMAL LOW (ref 4.22–5.81)
RDW: 15.2 % (ref 11.5–15.5)
WBC: 2.7 K/uL — ABNORMAL LOW (ref 4.0–10.5)
nRBC: 0 % (ref 0.0–0.2)

## 2023-10-08 LAB — RENAL FUNCTION PANEL
Albumin: 3.1 g/dL — ABNORMAL LOW (ref 3.5–5.0)
Anion gap: 11 (ref 5–15)
BUN: 23 mg/dL (ref 8–23)
CO2: 29 mmol/L (ref 22–32)
Calcium: 8.9 mg/dL (ref 8.9–10.3)
Chloride: 94 mmol/L — ABNORMAL LOW (ref 98–111)
Creatinine, Ser: 7.09 mg/dL — ABNORMAL HIGH (ref 0.61–1.24)
GFR, Estimated: 8 mL/min — ABNORMAL LOW (ref >60)
Glucose, Bld: 96 mg/dL (ref 70–99)
Phosphorus: 5.8 mg/dL — ABNORMAL HIGH (ref 2.5–4.6)
Potassium: 3.9 mmol/L (ref 3.5–5.1)
Sodium: 134 mmol/L — ABNORMAL LOW (ref 135–145)

## 2023-10-08 LAB — LIPOPROTEIN A (LPA): Lipoprotein (a): 72 nmol/L — ABNORMAL HIGH (ref <75.0)

## 2023-10-08 MED ORDER — SODIUM CHLORIDE 0.9 % IV SOLN
12.5000 mg | Freq: Once | INTRAVENOUS | Status: DC
  Filled 2023-10-08: qty 0.5

## 2023-10-08 MED FILL — Verapamil HCl IV Soln 2.5 MG/ML: INTRAVENOUS | Qty: 2 | Status: AC

## 2023-10-08 NOTE — Progress Notes (Signed)
 Pt got back from Dialysis late and no one was able to pick him up or was able to help him if he got home this evening. Patient will need to discharge tomorrow. Dr. Sundil was informed and D/C' d discharge orders.

## 2023-10-08 NOTE — Progress Notes (Addendum)
 Patient refused Heparin  SQ injection. Informed Dr. Sundil so she is aware.

## 2023-10-08 NOTE — Progress Notes (Signed)
 Cardiology Progress Note  Patient ID: Kirk [name] MRN: [id number] DOB: 1960-03-27 Date of Encounter: 10/08/2023 Primary Cardiologist: Vinie [name], MD  Subjective   Chief Complaint: None.   HPI: No CP or SOB reported.   ROS:  All other ROS reviewed and negative. Pertinent positives noted in the HPI.     Telemetry  Overnight telemetry shows SR 60s, which I personally reviewed.    Physical Exam   Vitals:   10/08/23 0020 10/08/23 0407 10/08/23 0529 10/08/23 0853  BP:  (!) 115/59 (!) 115/59   Pulse:  66  64  Resp: 18 18  16   Temp: 97.6 F (36.4 C) 97.7 F (36.5 C)    TempSrc: Oral Oral    SpO2: 99% 100%    Weight:      Height:        Intake/Output Summary (Last 24 hours) at 10/08/2023 0912 Last data filed at 10/07/2023 1950 Gross per 24 hour  Intake --  Output 2400 ml  Net -2400 ml       10/07/2023    7:50 PM 10/07/2023    4:56 PM 10/07/2023    4:51 PM  Last 3 Weights  Weight (lbs) 181 lb 7 oz 187 lb 9.8 oz 188 lb 0.8 oz  Weight (kg) 82.3 kg 85.1 kg 85.3 kg    Body mass index is 28.42 kg/m.  General: Well nourished, well developed, in no acute distress Head: Atraumatic, normal size  Eyes: PEERLA, EOMI  Neck: Supple, no JVD Endocrine: No thryomegaly Cardiac: Normal S1, S2; RRR; 3/6 HSM Lungs: Clear to auscultation bilaterally, no wheezing, rhonchi or rales  Abd: Soft, nontender, no hepatomegaly  Ext: No edema, pulses 2+ Musculoskeletal: No deformities, BUE and BLE strength normal and equal Skin: Warm and dry, no rashes   Neuro: Alert and oriented to person, place, time, and situation, CNII-XII grossly intact, no focal deficits  Psych: Normal mood and affect   Cardiac Studies  TTE 10/05/2023  1. Left ventricular ejection fraction, by estimation, is 25 to 30%. The  left ventricle has severely decreased function. The left ventricle  demonstrates global hypokinesis. Left ventricular diastolic parameters are  consistent with Grade II diastolic  dysfunction  (pseudonormalization). Elevated left atrial pressure. The E/e'  is 19.5.   2. Right ventricular systolic function is normal. The right ventricular  size is normal. There is moderately elevated pulmonary artery systolic  pressure. The estimated right ventricular systolic pressure is 45.5 mmHg.   3. Left atrial size was moderately dilated.   4. Right atrial size was moderately dilated.   5. Moderate mitral subvalvular calcification.   6. The mitral valve is degenerative. Mild to moderate mitral valve  regurgitation.   7. The aortic valve was not well visualized. Aortic valve regurgitation  is not visualized. Moderate to severe aortic valve stenosis (peak velocity  3.50m/s, MG , AVA VTI 1.04cm2, SVi 35, DI 0.27) consider severe Low  flow low gradient AS.   8. The inferior vena cava is dilated in size with <50% respiratory  variability, suggesting right atrial pressure of 15 mmHg.   Patient Profile  63 year old male with history of ESRD on hemodialysis, chronic systolic heart failure, OSA, hypertension admitted on 10/05/2023 with acute chest pain concerning for ST elevation myocardial infarction. Left heart catheterization normal. Found to have fever and likely viral pneumonia. Also found to have acute systolic HF and severe AS.   Assessment & Plan   # Acute chest pain # Rhinovirus+ # Community-acquired pneumonia -  Admitted with chest pain and concerns for ST elevation myocardial infarction.  Left heart catheterization normal.  Found to have rhinovirus and pneumonia. - Treatment per primary team.  # Acute systolic heart failure, EF 25-30% # Severe aortic stenosis - Symptoms likely related to acute systolic heart failure.  Has low-flow low gradient severe aortic stenosis. - Volume status managed by dialysis. - We will plan for outpatient TAVR evaluation. - Continue metoprolol  succinate 25 mg daily hydralazine  25 mg 3 times daily and Imdur  60 mg daily.  Not a candidate for  ACE/ARB/ARNI/MRA/SGLT2 inhibitor given ESRD.  # Coronary calcifications - No significant CAD.  Continue aspirin  and statin.  # ESRD on hemodialysis # Failed renal transplant - Per nephrology.  [facility] will sign off.   The patient is ready for discharge today from a cardiac standpoint. Medication Recommendations:  As above Other recommendations (labs, testing, etc):  none Follow up as an outpatient:  10/16/2023 with Hao Meng  For questions or updates, please contact [facility] Please consult [email or web address] for contact info under      Signed, Darryle T. Barbaraann, MD, [facility]   [facility]  10/08/2023 9:12 AM

## 2023-10-08 NOTE — Progress Notes (Signed)
 D/c noted. Contacted out-pt HD unit at this time, [facility] to inform of pt d/c and that pt will resume likely Wednesday at usual chair time. No further support needed.   Kaiyon Hynes Dialysis Navigator [phone]

## 2023-10-08 NOTE — Discharge Planning (Signed)
 [facility] Patient Discharge Orders - [facility] CLINIC: GOC  Patient's name: Kirk [name] Admit/DC Dates: 10/04/2023 - 10/08/2023  DISCHARGE DIAGNOSES: AHRF -> positive for rhinovirus -> better with HD and supportive care Chest pain -> s/p LHC 9/12 with minimal CAD  HD ORDER CHANGES: Heparin  change: no EDW Change: YES  New EDW: 82kg [place] Change: no  ANEMIA MANAGEMENT: Aranesp : Given: no ESA dose for discharge: Same as prior IV Iron dose at discharge: Per protocol Transfusion: Given: no  BONE/MINERAL MEDICATIONS: Hectorol /Calcitriol  change: no Sensipar /Parsabiv change: no  ACCESS INTERVENTION/CHANGE: no Details:  RECENT LABS: Recent Labs  Lab 10/08/23 0543  HGB 9.4*  NA 134*  K 3.9  CALCIUM  8.9  PHOS 5.8*  ALBUMIN  3.1*   IV ANTIBIOTICS: no Details:  OTHER ANTICOAGULATION: no Details:  OTHER/APPTS/LAB ORDERS:   D/C Meds to be reconciled by nurse after every discharge.  Completed By: Izetta Boehringer, PA-C [facility] Pager [phone]   Reviewed by: MD:______ RN_______

## 2023-10-08 NOTE — Progress Notes (Signed)
 Found Kirk [name] dressed and walking down hallway on his oxygen . PT had already removed his IV line this morning and no complications. There were plans to discharge him last night but he had dialysis late. PT refused to wait for his AVS and I was unable to print it since there was no discharge order. I communicated with attending about situation. Pt had forgotten his 2nd oxygen  tank and I retrieved from room and found him [employer]. I assisted him into daughter's car and they secured oxygen  tank. Daughter was aware of the [facility] [place] portal. I told her the AVS would likely be on there.

## 2023-10-08 NOTE — Discharge Summary (Signed)
 Physician Discharge Summary  [name]:[id number] DOB: 12/15/1960 DOA: 10/04/2023  PCP: Leonarda Roxan [name], NP  Admit date: 10/04/2023 Discharge date: 10/08/2023    Admitted From: Home Disposition: Home  Recommendations for Outpatient Follow-up:  Follow up with PCP in 1-2 weeks Please obtain BMP/CBC in one week Follow-up with your cardiologist on 10/16/2023. Please follow up with your PCP on the following pending results: Unresulted Labs (From admission, onward)     Start     Ordered   10/07/23 0701  CBC  Daily,   R     Question:  Specimen collection method  Answer:  Lab=Lab collect   10/07/23 0700   10/07/23 0701  Renal function panel  Daily,   R     Question:  Specimen collection method  Answer:  Lab=Lab collect   10/07/23 0700              Home Health: None Equipment/Devices: Oxygen   Discharge Condition: Stable CODE STATUS: Full code Diet recommendation:  Diet Order             Diet Heart Room service appropriate? Yes; Fluid consistency: Thin; Fluid restriction: 1200 mL Fluid  Diet effective now                   Subjective: Patient seen and examined, he says that he is feeling much better since he had dialysis yesterday.  He feels back to baseline.  Currently on 3 L of oxygen .  He is in agreement with going home.  Brief/Interim Summary: Kirk [name] is a 63 year old with ESRD on HD [name] failed renal transplant), hypertension, combined systolic and diastolic heart failure, gout, hyperlipidemia, OSA on CPAP who  was admitted by cardiology on 10/04/2023 for the evaluation of possible ST elevation MI.  Patient was taken directly to the Cath Lab and catheterization showed minimal CAD with no change from prior cath but did show reduced EF to 35-40%.  TRH consulted to help manage chronic medical problems and address low-grade fever.  After that, TRH assumed care of the patient as primary service for cardiology recommend and cardiology followed  as consultant.   # Chest pain - Presented with acute onset of constant, dull and nonradiating chest pain - Taken straight to the Cath Lab due to evidence of ST elevation on EKG - No significant CAD found on heart catheterization - Chest pain likely secondary to accelerated hypertension and CHF exacerbation which has resolved now.  Cardiology followed during hospitalization.   # Acute on chronic combined systolic and diastolic HF # Chronic hypoxic respiratory failure - Uses 2 L of oxygen  at baseline.  Last TTE in 2//25 showed EF 45 to 50%, LV global hypokinesis, G2DD, moderately elevated PASP, and moderately dilated LA - LHC on admission showed moderate-severe reduced EF to 35-40% - Patient with signs of CHF exacerbation on exam but no significant shortness of breath or hypoxia.  Repeat echo shows EF of 25 to 50% with grade 2 diastolic dysfunction.  Patient does not make urine, volume overload and CHF was treated with hemodialysis.  Due to reduced EF, amlodipine  discontinued and his metoprolol  transition to Toprol -XL by cardiology.   # ESRD on HD - On Monday Wednesday Friday HD, last HD session prior to admission on 10/04/2023. - He was fluid overloaded, nephrology consulted, he had extra hemodialysis on 10/06/2023.  Patient is going to receive his routine dialysis today and then will go home and resume his dialysis outpatient on MWF schedule.   #  Hypertensive urgency rule out/history of hypertension: - Patient found to have SBP in the 170s on admission.  Did not meet criteria for hypertensive urgency.  Patient's home medications including Lopressor , Imdur , amlodipine  and Cardura .  Resumed Imdur  and Cardura  as well as Lopressor  and blood pressure improved.   # Fever/viral (rhinovirus) and possibly bacterial community-acquired pneumonia: - Patient found to have fever 100.8 on admission, reported mild shortness of breath but no cough. X-ray does show some diffuse interstitial and ground glass  opacities favoring pulmonary edema but cannot rule out atypical infection.  Afebrile since then, has mild leukopenia.  Negative for COVID, influenza or RSV but positive for rhinovirus.  Based on the personal review of the x-ray, likely viral pneumonia but bacterial superinfection cannot be ruled out.  Treated symptomatically as well as with Rocephin  and Zithromax .  Feeling much better, he is requiring 3 L of oxygen  which is his baseline.  He feels comfortable going home.  Sending him home on Augmentin /renally dosed for next 5 days.   # Hypokalemia Replenished and resolved   # HLD - Continue pravastatin    # GERD - Continue Protonix    # OSA - Continue CPAP at bedtime   anemia of chronic disease: Baseline hemoglobin between 8-9.  Down to 6.9 on 10/06/2023 for which she received 1 unit of PBC transfusion, hemoglobin 8.1 today.    PS: Patient was discharged yesterday on 10/07/2023 but reportedly, he came very late from the dialysis after 9 PM and there was no one to pick him up so patient ended up staying overnight.  This morning I received a message from the nurse that patient was complaining of nausea, he was given Zofran  but that did not help.  I ordered another Phenergan .  However few minutes later even prior to getting Phenergan , patient told the nurse that he was feeling better and he wanted to go home.  That was 8:30 AM.  I responded to the nurse that I will be rounding around 10 AM.  I received a message that patient does not want to wait and he left even before the nurse could print AVS.  Discharge plan was discussed with patient and/or family member and they verbalized understanding and agreed with it.  Discharge Diagnoses:  Principal Problem:   Accelerated hypertension with diastolic congestive heart failure, NYHA class 3 (HCC) Active Problems:   CAP (community acquired pneumonia)   ESRD on dialysis (HCC)   Fever   Hypertensive urgency   Acute on chronic combined systolic and diastolic  CHF (congestive heart failure) (HCC)   Precordial chest pain   Nonischemic cardiomyopathy (HCC)   Benign hypertensive kidney disease with chronic kidney disease   Rhinovirus infection   Acute febrile illness   Nonrheumatic aortic valve stenosis    Discharge Instructions   Allergies as of 10/08/2023       Reactions   Codeine Nausea And Vomiting   Hydrocodone -acetaminophen  Nausea And Vomiting        Medication List    Some of the medications listed here do not show instructions, such as how often to take the medication. Ask your doctor or nurse how to use these medications. Specifically ask about this and similar medications: fluticasone -salmeterol (ADVAIR HFA) 45-21 MCG/ACT inhaler    STOP taking these medications    amLODipine  10 MG tablet Commonly known as: NORVASC    DULoxetine  60 MG capsule Commonly known as: CYMBALTA    metoprolol  tartrate 50 MG tablet Commonly known as: LOPRESSOR    pantoprazole  40 MG  tablet Commonly known as: Protonix    predniSONE  10 MG (21) Tbpk tablet Commonly known as: STERAPRED UNI-PAK 21 TAB       TAKE these medications    acetaminophen  500 MG tablet Commonly known as: TYLENOL  Take 1 tablet (500 mg total) by mouth every 6 (six) hours as needed for moderate pain. What changed:  how much to take reasons to take this   albuterol  108 (90 Base) MCG/ACT inhaler Commonly known as: VENTOLIN  HFA Inhale 2 puffs into the lungs every 6 (six) hours as needed for wheezing or shortness of breath.   albuterol  (2.5 MG/3ML) 0.083% nebulizer solution Commonly known as: PROVENTIL  Take 3 mLs (2.5 mg total) by nebulization every 4 (four) hours as needed for wheezing or shortness of breath.   amoxicillin -clavulanate 500-125 MG tablet Commonly known as: Augmentin  Take 1 tablet by mouth at bedtime for 5 days.   aspirin  EC 81 MG tablet Take 81 mg by mouth daily in the afternoon.   doxazosin  4 MG tablet Commonly known as: CARDURA  Take 1 tablet (4  mg total) by mouth at bedtime.   fluticasone -salmeterol 45-21 MCG/ACT inhaler Commonly known as: Advair HFA __________________________   guaiFENesin  600 MG 12 hr tablet Commonly known as: MUCINEX  Take 1,200 mg by mouth 2 (two) times daily as needed for cough or to loosen phlegm.   hydrALAZINE  50 MG tablet Commonly known as: APRESOLINE  Take 25 mg by mouth 3 (three) times daily.   ipratropium-albuterol  0.5-2.5 (3) MG/3ML Soln Commonly known as: DUONEB Take 3 mLs by nebulization every 6 (six) hours. Please take every 6 hours scheduled for next 5 days then as needed.   isosorbide  mononitrate 60 MG 24 hr tablet Commonly known as: IMDUR  Take 60 mg by mouth daily.   metoprolol  succinate 25 MG 24 hr tablet Commonly known as: TOPROL -XL Take 1 tablet (25 mg total) by mouth daily.   multivitamin Tabs tablet Take 1 tablet by mouth daily. What changed:  when to take this additional instructions   OXYGEN  Inhale into the lungs. 2 L  cont.   pravastatin  40 MG tablet Commonly known as: PRAVACHOL  TAKE 1 TABLET BY MOUTH EVERY DAY IN THE EVENING What changed:  how much to take how to take this when to take this additional instructions   sevelamer  carbonate 2.4 g Pack Commonly known as: RENVELA  Take 4.8 g by mouth 3 (three) times daily with meals. Also takes with his snacks   traMADol  50 MG tablet Commonly known as: ULTRAM  Take 50 mg by mouth daily as needed for severe pain (pain score 7-10).   traZODone  50 MG tablet Commonly known as: DESYREL  TAKE 0.5-1 TABLET (25-50 MG TOTAL) BY MOUTH AT BEDTIME AS NEEDED. FOR SLEEP What changed: See the new instructions.   UNABLE TO FIND Inhale 1 Device into the lungs at bedtime. CPAP   Velphoro  500 MG chewable tablet Generic drug: sucroferric oxyhydroxide Chew 1,000-1,500 mg by mouth See admin instructions. Take 3 tablets (1500mg ) by mouth three times daily with meals and take 2 tablets (1000mg ) with snacks.   Vitamin D3 25 MCG (1000  UT) Caps Take 4,000 Units by mouth See admin instructions. Patient takes Earlean Everts, Sat, Sun        Follow-up Information     Janene Boer, [place] Follow up.   Specialties: Cardiology, Radiology Why: Hospital follow-up with Cardiology scheduled for 10/16/2023 at 10:55am at our [facility] office. Please arrive 20 minutes early for check-in. If this date/ time does not work for you,  please call our office to reschedule. Contact information: [address] [place] [address] [phone]         Ngetich, Dinah C, NP Follow up in 1 week(s).   Specialty: Family Medicine Contact information: [address] [place] [address] [phone]                Allergies  Allergen Reactions   Codeine Nausea And Vomiting   Hydrocodone -Acetaminophen  Nausea And Vomiting    Consultations: Cardiology and nephrology   Procedures/Studies: ECHOCARDIOGRAM COMPLETE Result Date: 10/05/2023    ECHOCARDIOGRAM REPORT   Patient Name:   Kirk [name] Date of Exam: 10/05/2023 Medical Rec #:  [id number]       Height:       67.0 in Accession #:    [id number]      Weight:       185.4 lb Date of Birth:  1960/05/25        BSA:          1.958 m Patient Age:    63 years        BP:           100/60 mmHg Patient Gender: M               HR:           74 bpm. Exam Location:  Inpatient Procedure: 2D Echo, Cardiac Doppler and Color Doppler (Both Spectral and Color            Flow Doppler were utilized during procedure). Indications:    Nonischemic Cardiomyopathy I42.8  History:        Patient has no prior history of Echocardiogram examinations,                 most recent 03/18/2023. CHF and Cardiomyopathy, COPD and ESRD,                 Arrythmias:Atrial Fibrillation, Signs/Symptoms:Hypotension and                 Chest Pain; Risk Factors:Hypertension, Sleep Apnea, Diabetes and                 Dyslipidemia.  Sonographer:    Thea Norlander RCS Referring Phys: ALM ORN HARDING IMPRESSIONS  1. Left ventricular  ejection fraction, by estimation, is 25 to 30%. The left ventricle has severely decreased function. The left ventricle demonstrates global hypokinesis. Left ventricular diastolic parameters are consistent with Grade II diastolic dysfunction (pseudonormalization). Elevated left atrial pressure. The E/e' is 19.5.  2. Right ventricular systolic function is normal. The right ventricular size is normal. There is moderately elevated pulmonary artery systolic pressure. The estimated right ventricular systolic pressure is 45.5 mmHg.  3. Left atrial size was moderately dilated.  4. Right atrial size was moderately dilated.  5. Moderate mitral subvalvular calcification.  6. The mitral valve is degenerative. Mild to moderate mitral valve regurgitation.  7. The aortic valve was not well visualized. Aortic valve regurgitation is not visualized. Moderate to severe aortic valve stenosis (peak velocity 3.45m/s, MG , AVA VTI 1.04cm2, SVi 35, DI 0.27) consider severe Low flow low gradient AS.  8. The inferior vena cava is dilated in size with <50% respiratory variability, suggesting right atrial pressure of 15 mmHg. Comparison(s): A prior study was performed on 03/18/2023. LVEF 45-50%, global hypokinesis, G2DD, RVSP 59.52mmHg, moderate LAE, mild RAE, mild to moderate AS, RAP . FINDINGS  Left Ventricle: Left ventricular ejection fraction, by estimation, is 25 to  30%. The left ventricle has severely decreased function. The left ventricle demonstrates global hypokinesis. The left ventricular internal cavity size was normal in size. There is borderline left ventricular hypertrophy. Left ventricular diastolic parameters are consistent with Grade II diastolic dysfunction (pseudonormalization). Elevated left atrial pressure. The E/e' is 19.5. Right Ventricle: The right ventricular size is normal. No increase in right ventricular wall thickness. Right ventricular systolic function is normal. There is moderately elevated pulmonary  artery systolic pressure. The tricuspid regurgitant velocity is 2.76 m/s, and with an assumed right atrial pressure of 15 mmHg, the estimated right ventricular systolic pressure is 45.5 mmHg. Left Atrium: Left atrial size was moderately dilated. Right Atrium: Right atrial size was moderately dilated. Pericardium: There is no evidence of pericardial effusion. Mitral Valve: The mitral valve is degenerative in appearance. Mild to moderate mitral annular calcification. Moderate mitral subvalvular calcification. Mild to moderate mitral valve regurgitation. Tricuspid Valve: The tricuspid valve is not well visualized. Tricuspid valve regurgitation is trivial. No evidence of tricuspid stenosis. Aortic Valve: The aortic valve was not well visualized. Aortic valve regurgitation is not visualized. Moderate to severe aortic valve stenosis (peak velocity 3.7m/s, MG , AVA VTI 1.04cm2, SVi 35, DI 0.27) consider severe Low flow low gradient AS. Aortic valve mean gradient measures 22.0 mmHg. Aortic valve peak gradient measures 38.7 mmHg. Aortic valve area, by VTI measures 1.04 cm. Pulmonic Valve: The pulmonic valve was normal in structure. Pulmonic valve regurgitation is not visualized. No evidence of pulmonic stenosis. Aorta: The aortic root and ascending aorta are structurally normal, with no evidence of dilitation. Venous: The inferior vena cava is dilated in size with less than 50% respiratory variability, suggesting right atrial pressure of 15 mmHg. IAS/Shunts: The atrial septum is grossly normal.  LEFT VENTRICLE PLAX 2D LVIDd:         5.60 cm   Diastology LVIDs:         4.30 cm   LV e' medial:    5.00 cm/s LV PW:         1.30 cm   LV E/e' medial:  21.6 LV IVS:        1.10 cm   LV e' lateral:   6.53 cm/s LVOT diam:     2.20 cm   LV E/e' lateral: 16.5 LV SV:         69 LV SV Index:   35 LVOT Area:     3.80 cm  RIGHT VENTRICLE             IVC RV S prime:     14.00 cm/s  IVC diam: 2.80 cm TAPSE (M-mode): 2.4 cm LEFT  ATRIUM             Index        RIGHT ATRIUM           Index LA diam:        4.30 cm 2.20 cm/m   RA Area:     26.90 cm LA Vol (A2C):   88.3 ml 45.09 ml/m  RA Volume:   93.30 ml  47.65 ml/m LA Vol (A4C):   77.6 ml 39.63 ml/m LA Biplane Vol: 83.4 ml 42.59 ml/m  AORTIC VALVE AV Area (Vmax):    1.11 cm AV Area (Vmean):   1.16 cm AV Area (VTI):     1.04 cm AV Vmax:           311.00 cm/s AV Vmean:          204.333 cm/s  AV VTI:            0.661 m AV Peak Grad:      38.7 mmHg AV Mean Grad:      22.0 mmHg LVOT Vmax:         91.20 cm/s LVOT Vmean:        62.533 cm/s LVOT VTI:          0.181 m LVOT/AV VTI ratio: 0.27  AORTA Ao Root diam: 3.10 cm Ao Asc diam:  3.30 cm MITRAL VALVE                TRICUSPID VALVE MV Area (PHT): 4.49 cm     TR Peak grad:   30.5 mmHg MV Decel Time: 169 msec     TR Vmax:        276.00 cm/s MV E velocity: 108.00 cm/s MV A velocity: 83.30 cm/s   SHUNTS MV E/A ratio:  1.30         Systemic VTI:  0.18 m                             Systemic Diam: 2.20 cm Sunit Tolia Electronically signed by Madonna Large Signature Date/Time: 10/05/2023/3:40:31 PM    Final    DG Chest Port 1 View Result Date: 10/04/2023 CLINICAL DATA:  Shortness of breath EXAM: PORTABLE CHEST 1 VIEW COMPARISON:  06/19/2023, 03/18/2023 FINDINGS: Hardware in the cervical spine. Cardiomegaly. Aortic atherosclerosis. Cardiomegaly with vascular congestion and diffuse interstitial and ground-glass opacity, slightly progressive, favor pulmonary edema but atypical infection could also produce this appearance IMPRESSION: Cardiomegaly with vascular congestion and diffuse interstitial and ground-glass opacity, slightly progressive, favor pulmonary edema but atypical infection could also produce this appearance. Electronically Signed   By: Luke Bun M.D.   On: 10/04/2023 21:05   CARDIAC CATHETERIZATION Result Date: 10/04/2023 Table formatting from the original result was not included. Images from the original result were not  included. Dominance: Right                                                                                LV Gram (2 - Hypokinesis); EF ~35-40%            Angiographically minimal CAD with no change from prior catheterization. Moderate-severely reduced LVEF (estimated 30 to 35% with global hypokinesis) with EDP of 16 to 18 mmHg. => Nonischemic Cardiomyopathy as previously indicated  RECOMMENDATIONS   Anticipated discharge date to be determined.   The patient will be followed by cardiology but will likely need to be comanaged by nephrology and internal medicine. Will need aggressive blood pressure management.   No indication for antiplatelet therapy at this time . Alm Clay, MD    Discharge Exam: Vitals:   10/08/23 0529 10/08/23 0853  BP: (!) 115/59   Pulse:  64  Resp:  16  Temp:    SpO2:     Vitals:   10/08/23 0020 10/08/23 0407 10/08/23 0529 10/08/23 0853  BP:  (!) 115/59 (!) 115/59   Pulse:  66  64  Resp: 18 18  16   Temp: 97.6 F (36.4 C) 97.7 F (36.5 C)  TempSrc: Oral Oral    SpO2: 99% 100%    Weight:      Height:        General: Pt is alert, awake, not in acute distress Cardiovascular: RRR, S1/S2 +, no rubs, no gallops Respiratory: CTA bilaterally, no wheezing, no rhonchi Abdominal: Soft, NT, ND, bowel sounds + Extremities: no edema, no cyanosis    The results of significant diagnostics from this hospitalization (including imaging, microbiology, ancillary and laboratory) are listed below for reference.     Microbiology: Recent Results (from the past 240 hours)  Blood culture (routine x 2)     Status: None (Preliminary result)   Collection Time: 10/04/23  8:42 PM   Specimen: BLOOD  Result Value Ref Range Status   Specimen Description BLOOD RIGHT ANTECUBITAL  Final   Special Requests   Final    BOTTLES DRAWN AEROBIC AND ANAEROBIC Blood Culture adequate volume   Culture   Final    NO GROWTH 4 DAYS Performed at [facility], [address]  [place], [place] [address]    Report Status PENDING  Incomplete  Blood culture (routine x 2)     Status: None (Preliminary result)   Collection Time: 10/04/23  8:42 PM   Specimen: BLOOD RIGHT HAND  Result Value Ref Range Status   Specimen Description BLOOD RIGHT HAND  Final   Special Requests   Final    BOTTLES DRAWN AEROBIC AND ANAEROBIC Blood Culture results may not be optimal due to an inadequate volume of blood received in culture bottles   Culture   Final    NO GROWTH 4 DAYS Performed at [facility], [address] [place], [place] [address]    Report Status PENDING  Incomplete  Resp panel by RT-PCR (RSV, Flu A&B, Covid) Anterior Nasal Swab     Status: None   Collection Time: 10/05/23  4:00 AM   Specimen: Anterior Nasal Swab  Result Value Ref Range Status   SARS Coronavirus 2 by RT PCR NEGATIVE NEGATIVE Final   Influenza A by PCR NEGATIVE NEGATIVE Final   Influenza B by PCR NEGATIVE NEGATIVE Final    Comment: (NOTE) The Xpert Xpress SARS-CoV-2/FLU/RSV plus assay is intended as an aid in the diagnosis of influenza from Nasopharyngeal swab specimens and should not be used as a sole basis for treatment. Nasal washings and aspirates are unacceptable for Xpert Xpress SARS-CoV-2/FLU/RSV testing.  Fact Sheet for Patients: BloggerCourse.com  Fact Sheet for Healthcare Providers: SeriousBroker.it  This test is not yet approved or cleared by the [place]  FDA and has been authorized for detection and/or diagnosis of SARS-CoV-2 by FDA under an Emergency Use Authorization (EUA). This EUA will remain in effect (meaning this test can be used) for the duration of the COVID-19 declaration under Section 564(b)(1) of the Act, 21 U.S.C. section 360bbb-3(b)(1), unless the authorization is terminated or revoked.     Resp Syncytial Virus by PCR NEGATIVE NEGATIVE Final    Comment: (NOTE) Fact Sheet for  Patients: BloggerCourse.com  Fact Sheet for Healthcare Providers: SeriousBroker.it  This test is not yet approved or cleared by the [place]  FDA and has been authorized for detection and/or diagnosis of SARS-CoV-2 by FDA under an Emergency Use Authorization (EUA). This EUA will remain in effect (meaning this test can be used) for the duration of the COVID-19 declaration under Section 564(b)(1) of the Act, 21 U.S.C. section 360bbb-3(b)(1), unless the authorization is terminated or revoked.  Performed at [facility]  Lab, [address] [place], [place] [address]   Respiratory (~20 pathogens) panel by PCR     Status: Abnormal   Collection Time: 10/05/23  4:00 AM   Specimen: Nasopharyngeal Swab; Respiratory  Result Value Ref Range Status   Adenovirus NOT DETECTED NOT DETECTED Final   Coronavirus 229E NOT DETECTED NOT DETECTED Final    Comment: (NOTE) The Coronavirus on the Respiratory Panel, DOES NOT test for the novel  Coronavirus (2019 nCoV)    Coronavirus HKU1 NOT DETECTED NOT DETECTED Final   Coronavirus NL63 NOT DETECTED NOT DETECTED Final   Coronavirus OC43 NOT DETECTED NOT DETECTED Final   Metapneumovirus NOT DETECTED NOT DETECTED Final   Rhinovirus / Enterovirus DETECTED (A) NOT DETECTED Final   Influenza A NOT DETECTED NOT DETECTED Final   Influenza B NOT DETECTED NOT DETECTED Final   Parainfluenza Virus 1 NOT DETECTED NOT DETECTED Final   Parainfluenza Virus 2 NOT DETECTED NOT DETECTED Final   Parainfluenza Virus 3 NOT DETECTED NOT DETECTED Final   Parainfluenza Virus 4 NOT DETECTED NOT DETECTED Final   Respiratory Syncytial Virus NOT DETECTED NOT DETECTED Final   Bordetella pertussis NOT DETECTED NOT DETECTED Final   Bordetella Parapertussis NOT DETECTED NOT DETECTED Final   Chlamydophila pneumoniae NOT DETECTED NOT DETECTED Final   Mycoplasma pneumoniae NOT DETECTED NOT DETECTED Final    Comment: Performed at  [facility], [address], [place], [place] [address]  MRSA Next Gen by PCR, Nasal     Status: None   Collection Time: 10/05/23  4:00 AM   Specimen: Nasal Mucosa; Nasal Swab  Result Value Ref Range Status   MRSA by PCR Next Gen NOT DETECTED NOT DETECTED Final    Comment: (NOTE) The GeneXpert MRSA Assay (FDA approved for NASAL specimens only), is one component of a comprehensive MRSA colonization surveillance program. It is not intended to diagnose MRSA infection nor to guide or monitor treatment for MRSA infections. Test performance is not FDA approved in patients less than 62 years old. Performed at [facility], [address], [place], [place] [address]      Labs: BNP (last 3 results) Recent Labs    03/16/23 1152 06/19/23 1606 10/04/23 1855  BNP 1,610.6* 1,727.0* 2,277.3*   Basic Metabolic Panel: Recent Labs  Lab 10/04/23 1855 10/04/23 1924 10/04/23 1925 10/05/23 0447 10/06/23 1033 10/07/23 0524 10/08/23 0543  NA 136 136 137 136 132* 135 134*  K 3.0* 3.0* 3.1* 3.6 4.1 4.1 3.9  CL 93* 92*  --  92* 92* 92* 94*  CO2 30  --   --  30 27 29 29   GLUCOSE 93 98  --  112* 108* 102* 96  BUN 10 11  --  16 35* 27* 23  CREATININE 5.33* 5.40*  --  6.78* 10.18* 8.02* 7.09*  CALCIUM  8.3*  --   --  7.9* 8.2* 9.0 8.9  MG  --   --   --  1.8  --   --   --   PHOS  --   --   --  4.8* 6.4* 5.5* 5.8*   Liver Function Tests: Recent Labs  Lab 10/04/23 1855 10/05/23 0447 10/06/23 1033 10/07/23 0524 10/08/23 0543  AST 15  --   --   --   --   ALT 10  --   --   --   --   ALKPHOS 65  --   --   --   --  BILITOT 0.9  --   --   --   --   PROT 7.0  --   --   --   --   ALBUMIN  3.3* 2.9* 2.7* 2.9* 3.1*   No results for input(s): LIPASE, AMYLASE in the last 168 hours. No results for input(s): AMMONIA in the last 168 hours. CBC: Recent Labs  Lab 10/04/23 1855 10/04/23 1924 10/05/23 0447 10/06/23 0503 10/06/23 1033 10/07/23 0524 10/08/23 0543  WBC 3.2*   < >  2.3* 2.5* 1.7* 2.4* 2.7*  NEUTROABS 2.4  --   --  1.5*  --   --   --   HGB 9.4*   < > 7.8* 7.1* 6.9* 8.1* 9.4*  HCT 29.6*   < > 24.0* 22.6* 21.7* 25.4* 28.5*  MCV 95.8   < > 95.2 97.4 97.7 96.9 95.6  PLT 78*   < > 78* 86* 69* 79* 100*   < > = values in this interval not displayed.   Cardiac Enzymes: No results for input(s): CKTOTAL, CKMB, CKMBINDEX, TROPONINI in the last 168 hours. BNP: Invalid input(s): POCBNP CBG: No results for input(s): GLUCAP in the last 168 hours. D-Dimer No results for input(s): DDIMER in the last 72 hours. Hgb A1c No results for input(s): HGBA1C in the last 72 hours. Lipid Profile Recent Labs    10/07/23 0524  CHOL 92  HDL 23*  LDLCALC 44  TRIG 873  CHOLHDL 4.0   Thyroid  function studies No results for input(s): TSH, T4TOTAL, T3FREE, THYROIDAB in the last 72 hours.  Invalid input(s): FREET3 Anemia work up No results for input(s): VITAMINB12, FOLATE, FERRITIN, TIBC, IRON, RETICCTPCT in the last 72 hours. Urinalysis    Component Value Date/Time   COLORURINE YELLOW 02/19/2017 1520   APPEARANCEUR HAZY (A) 02/19/2017 1520   LABSPEC 1.012 02/19/2017 1520   PHURINE 8.0 02/19/2017 1520   GLUCOSEU 50 (A) 02/19/2017 1520   HGBUR MODERATE (A) 02/19/2017 1520   BILIRUBINUR NEGATIVE 02/19/2017 1520   KETONESUR NEGATIVE 02/19/2017 1520   PROTEINUR >=300 (A) 02/19/2017 1520   UROBILINOGEN 0.2 04/06/2014 1030   NITRITE NEGATIVE 02/19/2017 1520   LEUKOCYTESUR MODERATE (A) 02/19/2017 1520   Sepsis Labs Recent Labs  Lab 10/06/23 0503 10/06/23 1033 10/07/23 0524 10/08/23 0543  WBC 2.5* 1.7* 2.4* 2.7*   Microbiology Recent Results (from the past 240 hours)  Blood culture (routine x 2)     Status: None (Preliminary result)   Collection Time: 10/04/23  8:42 PM   Specimen: BLOOD  Result Value Ref Range Status   Specimen Description BLOOD RIGHT ANTECUBITAL  Final   Special Requests   Final    BOTTLES DRAWN  AEROBIC AND ANAEROBIC Blood Culture adequate volume   Culture   Final    NO GROWTH 4 DAYS Performed at [facility], [address], [place], [place] [address]    Report Status PENDING  Incomplete  Blood culture (routine x 2)     Status: None (Preliminary result)   Collection Time: 10/04/23  8:42 PM   Specimen: BLOOD RIGHT HAND  Result Value Ref Range Status   Specimen Description BLOOD RIGHT HAND  Final   Special Requests   Final    BOTTLES DRAWN AEROBIC AND ANAEROBIC Blood Culture results may not be optimal due to an inadequate volume of blood received in culture bottles   Culture   Final    NO GROWTH 4 DAYS Performed at [facility], [address], [place], [place]  [address]    Report Status PENDING  Incomplete  Resp panel by RT-PCR (RSV, Flu A&B, Covid) Anterior Nasal Swab     Status: None   Collection Time: 10/05/23  4:00 AM   Specimen: Anterior Nasal Swab  Result Value Ref Range Status   SARS Coronavirus 2 by RT PCR NEGATIVE NEGATIVE Final   Influenza A by PCR NEGATIVE NEGATIVE Final   Influenza B by PCR NEGATIVE NEGATIVE Final    Comment: (NOTE) The Xpert Xpress SARS-CoV-2/FLU/RSV plus assay is intended as an aid in the diagnosis of influenza from Nasopharyngeal swab specimens and should not be used as a sole basis for treatment. Nasal washings and aspirates are unacceptable for Xpert Xpress SARS-CoV-2/FLU/RSV testing.  Fact Sheet for Patients: BloggerCourse.com  Fact Sheet for Healthcare Providers: SeriousBroker.it  This test is not yet approved or cleared by the [place]  FDA and has been authorized for detection and/or diagnosis of SARS-CoV-2 by FDA under an Emergency Use Authorization (EUA). This EUA will remain in effect (meaning this test can be used) for the duration of the COVID-19 declaration under Section 564(b)(1) of the Act, 21 U.S.C. section 360bbb-3(b)(1), unless the authorization is  terminated or revoked.     Resp Syncytial Virus by PCR NEGATIVE NEGATIVE Final    Comment: (NOTE) Fact Sheet for Patients: BloggerCourse.com  Fact Sheet for Healthcare Providers: SeriousBroker.it  This test is not yet approved or cleared by the [place]  FDA and has been authorized for detection and/or diagnosis of SARS-CoV-2 by FDA under an Emergency Use Authorization (EUA). This EUA will remain in effect (meaning this test can be used) for the duration of the COVID-19 declaration under Section 564(b)(1) of the Act, 21 U.S.C. section 360bbb-3(b)(1), unless the authorization is terminated or revoked.  Performed at [facility], [address] [place], [place] [address]   Respiratory (~20 pathogens) panel by PCR     Status: Abnormal   Collection Time: 10/05/23  4:00 AM   Specimen: Nasopharyngeal Swab; Respiratory  Result Value Ref Range Status   Adenovirus NOT DETECTED NOT DETECTED Final   Coronavirus 229E NOT DETECTED NOT DETECTED Final    Comment: (NOTE) The Coronavirus on the Respiratory Panel, DOES NOT test for the novel  Coronavirus (2019 nCoV)    Coronavirus HKU1 NOT DETECTED NOT DETECTED Final   Coronavirus NL63 NOT DETECTED NOT DETECTED Final   Coronavirus OC43 NOT DETECTED NOT DETECTED Final   Metapneumovirus NOT DETECTED NOT DETECTED Final   Rhinovirus / Enterovirus DETECTED (A) NOT DETECTED Final   Influenza A NOT DETECTED NOT DETECTED Final   Influenza B NOT DETECTED NOT DETECTED Final   Parainfluenza Virus 1 NOT DETECTED NOT DETECTED Final   Parainfluenza Virus 2 NOT DETECTED NOT DETECTED Final   Parainfluenza Virus 3 NOT DETECTED NOT DETECTED Final   Parainfluenza Virus 4 NOT DETECTED NOT DETECTED Final   Respiratory Syncytial Virus NOT DETECTED NOT DETECTED Final   Bordetella pertussis NOT DETECTED NOT DETECTED Final   Bordetella Parapertussis NOT DETECTED NOT DETECTED Final   Chlamydophila  pneumoniae NOT DETECTED NOT DETECTED Final   Mycoplasma pneumoniae NOT DETECTED NOT DETECTED Final    Comment: Performed at [facility], [address] [place], [place] [address]  MRSA Next Gen by PCR, Nasal     Status: None   Collection Time: 10/05/23  4:00 AM   Specimen: Nasal Mucosa; Nasal Swab  Result Value Ref Range Status   MRSA by PCR Next Gen NOT DETECTED NOT  DETECTED Final    Comment: (NOTE) The GeneXpert MRSA Assay (FDA approved for NASAL specimens only), is one component of a comprehensive MRSA colonization surveillance program. It is not intended to diagnose MRSA infection nor to guide or monitor treatment for MRSA infections. Test performance is not FDA approved in patients less than 14 years old. Performed at [facility], [address] [place], [place] [address]     FURTHER DISCHARGE INSTRUCTIONS:   Get Medicines reviewed and adjusted: Please take all your medications with you for your next visit with your Primary MD   Laboratory/radiological data: Please request your Primary MD to go over all hospital tests and procedure/radiological results at the follow up, please ask your Primary MD to get all Hospital records sent to his/her office.   In some cases, they will be blood work, cultures and biopsy results pending at the time of your discharge. Please request that your primary care M.D. goes through all the records of your hospital data and follows up on these results.   Also Note the following: If you experience worsening of your admission symptoms, develop shortness of breath, life threatening emergency, suicidal or homicidal thoughts you must seek medical attention immediately by calling 911 or calling your MD immediately  if symptoms less severe.   You must read complete instructions/literature along with all the possible adverse reactions/side effects for all the Medicines you take and that have been prescribed to you. Take any new Medicines after  you have completely understood and accpet all the possible adverse reactions/side effects.    patient was instructed, not to drive, operate heavy machinery, perform activities at heights, swimming or participation in water  activities or provide baby-sitting services while on Pain, Sleep and Anxiety Medications; until their outpatient Physician has advised to do so again. Also recommended to not to take more than prescribed Pain, Sleep and Anxiety Medications.  It is not advisable to combine anxiety, sleep and pain medications without talking with your primary care provider.     Wear Seat belts while driving.   Please note: You were cared for by a hospitalist during your hospital stay. Once you are discharged, your primary care physician will handle any further medical issues. Please note that NO REFILLS for any discharge medications will be authorized once you are discharged, as it is imperative that you return to your primary care physician (or establish a relationship with a primary care physician if you do not have one) for your post hospital discharge needs so that they can reassess your need for medications and monitor your lab values  Time coordinating discharge: Over 30 minutes  SIGNED:   Fredia Skeeter, MD  [facility] 10/08/2023, 10:45 AM *Please note that this is a verbal dictation therefore any spelling or grammatical errors are due to the Dragon Medical One system interpretation. If 7PM-7AM, please contact night-coverage [email or web address]

## 2023-10-09 ENCOUNTER — Telehealth (HOSPITAL_COMMUNITY): Payer: Self-pay | Admitting: Nephrology

## 2023-10-09 DIAGNOSIS — N186 End stage renal disease: Secondary | ICD-10-CM | POA: Diagnosis not present

## 2023-10-09 DIAGNOSIS — N2581 Secondary hyperparathyroidism of renal origin: Secondary | ICD-10-CM | POA: Diagnosis not present

## 2023-10-09 DIAGNOSIS — Z992 Dependence on renal dialysis: Secondary | ICD-10-CM | POA: Diagnosis not present

## 2023-10-09 DIAGNOSIS — D509 Iron deficiency anemia, unspecified: Secondary | ICD-10-CM | POA: Diagnosis not present

## 2023-10-09 DIAGNOSIS — D631 Anemia in chronic kidney disease: Secondary | ICD-10-CM | POA: Diagnosis not present

## 2023-10-09 LAB — CULTURE, BLOOD (ROUTINE X 2)
Culture: NO GROWTH
Culture: NO GROWTH
Special Requests: ADEQUATE

## 2023-10-09 NOTE — Telephone Encounter (Signed)
 Transition of care contact from inpatient facility  Date of Discharge: 10/08/2023 Date of Contact: 10/09/2023 Method of contact: Phone  Attempted to contact patient to discuss transition of care from inpatient admission. Patient did not answer the phone. Message was left on the patient's voicemail with call back number [phone].  Izetta Boehringer, PA-C [employer] Pager [phone]

## 2023-10-11 DIAGNOSIS — Z992 Dependence on renal dialysis: Secondary | ICD-10-CM | POA: Diagnosis not present

## 2023-10-11 DIAGNOSIS — D649 Anemia, unspecified: Secondary | ICD-10-CM | POA: Diagnosis not present

## 2023-10-11 DIAGNOSIS — D631 Anemia in chronic kidney disease: Secondary | ICD-10-CM | POA: Diagnosis not present

## 2023-10-11 DIAGNOSIS — N2581 Secondary hyperparathyroidism of renal origin: Secondary | ICD-10-CM | POA: Diagnosis not present

## 2023-10-11 DIAGNOSIS — N186 End stage renal disease: Secondary | ICD-10-CM | POA: Diagnosis not present

## 2023-10-11 DIAGNOSIS — J9601 Acute respiratory failure with hypoxia: Secondary | ICD-10-CM | POA: Diagnosis not present

## 2023-10-11 DIAGNOSIS — D509 Iron deficiency anemia, unspecified: Secondary | ICD-10-CM | POA: Diagnosis not present

## 2023-10-11 DIAGNOSIS — B348 Other viral infections of unspecified site: Secondary | ICD-10-CM | POA: Diagnosis not present

## 2023-10-14 DIAGNOSIS — D631 Anemia in chronic kidney disease: Secondary | ICD-10-CM | POA: Diagnosis not present

## 2023-10-14 DIAGNOSIS — Z992 Dependence on renal dialysis: Secondary | ICD-10-CM | POA: Diagnosis not present

## 2023-10-14 DIAGNOSIS — D509 Iron deficiency anemia, unspecified: Secondary | ICD-10-CM | POA: Diagnosis not present

## 2023-10-14 DIAGNOSIS — N2581 Secondary hyperparathyroidism of renal origin: Secondary | ICD-10-CM | POA: Diagnosis not present

## 2023-10-14 DIAGNOSIS — N186 End stage renal disease: Secondary | ICD-10-CM | POA: Diagnosis not present

## 2023-10-16 ENCOUNTER — Encounter: Payer: Self-pay | Admitting: Physician Assistant

## 2023-10-16 ENCOUNTER — Ambulatory Visit: Attending: Internal Medicine | Admitting: Physician Assistant

## 2023-10-16 VITALS — BP 170/72 | HR 69 | Ht 67.0 in | Wt 188.0 lb

## 2023-10-16 DIAGNOSIS — I350 Nonrheumatic aortic (valve) stenosis: Secondary | ICD-10-CM | POA: Diagnosis not present

## 2023-10-16 DIAGNOSIS — I1A0 Resistant hypertension: Secondary | ICD-10-CM | POA: Diagnosis not present

## 2023-10-16 DIAGNOSIS — N186 End stage renal disease: Secondary | ICD-10-CM | POA: Diagnosis not present

## 2023-10-16 DIAGNOSIS — I351 Nonrheumatic aortic (valve) insufficiency: Secondary | ICD-10-CM

## 2023-10-16 DIAGNOSIS — I5042 Chronic combined systolic (congestive) and diastolic (congestive) heart failure: Secondary | ICD-10-CM | POA: Diagnosis not present

## 2023-10-16 MED ORDER — HYDRALAZINE HCL 50 MG PO TABS: 50.0000 mg | ORAL_TABLET | Freq: Three times a day (TID) | ORAL | 3 refills | Status: DC

## 2023-10-16 NOTE — Patient Instructions (Signed)
 Thank you for choosing [facility]!     Medication Instructions:  Increase the Hydralazine  to 50mg . Take 1 tablet three time daily for blood pressure control.   *If you need a refill on your cardiac medications before your next appointment, please call your pharmacy*   Lab Work: No labs were ordered during today's visit.  If you have labs (blood work) drawn today and your tests are completely normal, you will receive your results only by: MyChart Message (if you have MyChart) OR A paper copy in the mail If you have any lab test that is abnormal or we need to change your treatment, we will call you to review the results.   Testing/Procedures: No procedures were ordered during today's visit.     Your next appointment:  Keep scheduled appointment     Follow-Up: At [facility], you and your health needs are our priority.  As part of our continuing mission to provide you with exceptional heart care, we have created designated Provider Care Teams.  These Care Teams include your primary Cardiologist (physician) and Advanced Practice Providers (APPs -  Physician Assistants and Nurse Practitioners) who all work together to provide you with the care you need, when you need it. We recommend signing up for the patient portal called MyChart.  Sign up information is provided on this After Visit Summary.  MyChart is used to connect with patients for Virtual Visits (Telemedicine).  Patients are able to view lab/test results, encounter notes, upcoming appointments, etc.  Non-urgent messages can be sent to your provider as well.   To learn more about what you can do with MyChart, go to [email or web address].

## 2023-10-16 NOTE — Progress Notes (Unsigned)
 Cardiology Office Note   Date:  10/18/2023  ID:  MCDONALD REILING, DOB 25-Sep-1960, MRN [id number] PCP: Leonarda Roxan [name], NP  [facility] Providers Cardiologist:  Vinie [name] Maxcy, MD     History of Present Illness Kirk [name] is a 63 y.o. 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  Previous cardiac catheterization in 2016 showed no significant CAD.  Echocardiogram showed EF 50 to 55% in December 2024.  PET stress test in March 2025 showed EF of 35%, decreased blood flow reserve globally in each coronary distribution suggestive of severe multivessel CAD versus microvascular disease.  Echocardiogram obtained at the same time showed EF 45 to 50%.  Subsequent cardiac catheterization performed on 04/09/2023 showed angiographically minimal CAD with a right dominant system, LVEDP 10-15 mmHg.  There is possibility of microvascular ischemia but TIMI-III flow in the microvasculature. More recently, patient was admitted to the hospital on 10/04/2023 for chest pain.  Initial EKG obtained by EMS showed biphasic ST elevation in the septal lead.  He also had a low-grade fever of 100.8.  Due to his symptom and EKG changes, he was taken to the Cath Lab urgently.  Cardiac catheterization performed on 10/04/2023 showed angiographically minimal CAD with no change from the previous cath, EF further reduced to 30 to 35%.  He was subsequently worked up by medicine team due to unknown origin of fever and nephrology needed.  His metoprolol  tartrate was switched to metoprolol  succinate.  Hydralazine  started. Viral panel was positive for rhinovirus.  He was also treated for community-acquired pneumonia.  Echocardiogram obtained during this hospitalization showed EF 25 to 30%, global hypokinesis, RV systolic pressure of 45.5 mmHg, moderate biatrial enlargement, mild to  moderate MR, moderate to severe aortic stenosis.  Outpatient evaluation by structural heart team was recommended.  Patient presents today for follow-up accompanied by daughter.  His wife was also on the phone.  His dialysis session has been delayed until tomorrow.  Blood pressure is very high today, he does not know his blood pressure at home.  I asked the wife to keep a blood pressure diary of twice a day.  I will increase his hydralazine  to 50 mg 3 times a day dosing.  His wife confirmed he is currently on 25 mg 3 times a day.  Based on his blood pressure diary, we can further adjust his blood pressure medication.  We reviewed the recent hospitalization and the echo finding.  I will refer the patient to structural heart clinic for consideration of TAVR.  Given his end-stage renal disease, he likely would not be a good candidate for traditional valve repair.  He has follow-up with Dr. Maxcy next month.  Otherwise, he appears to be euvolemic on exam.  ROS:   He denies chest pain, palpitations, dyspnea, pnd, orthopnea, n, v, dizziness, syncope, edema, weight gain, or early satiety. All other systems reviewed and are otherwise negative except as noted above.    Studies Reviewed      Cardiac Studies & Procedures   ______________________________________________________________________________________________ CARDIAC CATHETERIZATION  CARDIAC CATHETERIZATION 10/04/2023  Conclusion Table formatting from the original result was not included. Images from the original result were not included. Dominance: Right  LV Gram (2 - Hypokinesis); EF ~35-40%   Angiographically minimal CAD with no change from prior catheterization. Moderate-severely reduced LVEF (estimated 30 to 35% with global hypokinesis) with EDP of 16 to 18 mmHg. => Nonischemic Cardiomyopathy as previously indicated   RECOMMENDATIONS   Anticipated discharge date  to be determined.   The patient will be followed by cardiology but will likely need to be comanaged by nephrology and internal medicine. Will need aggressive blood pressure management.   No indication for antiplatelet therapy at this time .     Alm Clay, MD  Findings Coronary Findings Diagnostic  Dominance: Right  Left Main Vessel is large. Vessel is angiographically normal.  Left Anterior Descending Vessel is large. The vessel exhibits minimal luminal irregularities.  First Diagonal Branch Vessel is small in size.  Second Diagonal Branch Vessel is large in size.  Third Diagonal Branch Vessel is small in size.  Left Circumflex Vessel is large. The vessel exhibits minimal luminal irregularities.  First Obtuse Marginal Branch Vessel is small in size.  First Left Posterolateral Branch Vessel is small in size.  Left Posterior Atrioventricular Artery Vessel is small in size.  Right Coronary Artery Vessel is large. Vessel is angiographically normal.  Acute Marginal Branch Vessel is small in size.  Right Ventricular Branch Vessel is small in size.  Right Posterior Descending Artery Vessel is large in size.  Right Posterior Atrioventricular Artery Vessel is large in size.  Third Right Posterolateral Branch Vessel is large in size.  Intervention  No interventions have been documented.   CARDIAC CATHETERIZATION  CARDIAC CATHETERIZATION 04/09/2023  Conclusion Table formatting from the original result was not included. Images from the original result were not included. Dominance: Right   Angiographically Minimal CAD with Right Dominantn System Normal LVEDP with ~10-15 mmHg AoV Gradient  RECOMMENDATIONS Stable for discharge after sheath removal Follow-up with primary cardiologist to evaluate for nonischemic cardiomyopathy.  There is possibility of microvascular ischemia, but TIMI-3 flow in the macrovasculature   Alm Clay,  MD  Findings Coronary Findings Diagnostic  Dominance: Right  Left Main Vessel was injected. Vessel is large. Vessel is angiographically normal.  Left Anterior Descending Vessel is large. The vessel exhibits minimal luminal irregularities.  First Diagonal Branch Vessel is small in size.  Second Diagonal Branch Vessel is large in size.  Third Diagonal Branch Vessel is small in size.  Left Circumflex Vessel is large.  First Obtuse Marginal Branch Vessel is small in size.  First Left Posterolateral Branch Vessel is small in size.  Left Posterior Atrioventricular Artery Vessel is small in size.  Right Coronary Artery Vessel was injected. Vessel is large. Vessel is angiographically normal.  Acute Marginal Branch Vessel is small in size.  Right Ventricular Branch Vessel is small in size.  Right Posterior Descending Artery Vessel is large in size.  Right Posterior Atrioventricular Artery Vessel is large in size.  Third Right Posterolateral Branch Vessel is large in size.  Intervention  No interventions have been documented.   STRESS TESTS  NM PET CT CARDIAC PERFUSION MULTI W/ABSOLUTE BLOODFLOW 03/05/2023  Narrative   Normal perfusion images.  Myocardial blood flow reserve is decreased globally and in each coronary distribution, suggestive of severe multivessel CAD vs microvascular disease (though flow reserve can be inaccurate in setting of ESRD).  LVEF is moderately reduced (35% at rest, 37% at stress).  Severe coronary calcifications on CT.  No high risk findings such as TID or drop in EF with stress.  Overall, study is  intermediate risk   LV perfusion is normal. There is no evidence of ischemia. There is no evidence of infarction.   Rest left ventricular function is abnormal. Rest global function is moderately reduced. Rest EF: 35%. Stress left ventricular function is abnormal. Stress global function is moderately reduced. Stress EF: 37%. End diastolic cavity  size is moderately enlarged. End systolic cavity size is moderately enlarged.   Myocardial blood flow was computed to be 0.40ml/g/min at rest and 1.55ml/g/min at stress. Global myocardial blood flow reserve was 1.67 and was abnormal.   Coronary calcium  was present on the attenuation correction CT images. Severe coronary calcifications were present. Coronary calcifications were present in the left anterior descending artery, left circumflex artery and right coronary artery distribution(s).   The study is intermediate risk.   Electronically signed by Lonni Nanas, MD  CLINICAL DATA:  This over-read does not include interpretation of cardiac or coronary anatomy or pathology. The Cardiac PET CT interpretation by the cardiologist is attached.  COMPARISON:  CT chest angiogram, 01/08/2023  FINDINGS: Cardiovascular: Aortic atherosclerosis. Dense aortic valve calcifications. Cardiomegaly. Three-vessel coronary artery calcifications. Enlargement of the main pulmonary artery measuring up to 3.9 cm in caliber. No pericardial effusion.  Limited Mediastinum/Nodes: No enlarged mediastinal, hilar, or axillary lymph nodes. Trachea and esophagus demonstrate no significant findings.  Limited Lungs/Pleura: Small bilateral pleural effusions. Interlobular septal thickening throughout the lung bases. Mosaic attenuation of the airspaces.  Upper Abdomen: No acute abnormality.  Musculoskeletal: No chest wall abnormality. No acute osseous findings.  IMPRESSION: 1. Small bilateral pleural effusions and interlobular septal thickening throughout the lung bases, consistent with pulmonary edema. 2. Mosaic attenuation of the airspaces, consistent with small airways disease. 3. Cardiomegaly and coronary artery disease. 4. Enlargement of the main pulmonary artery, as can be seen in pulmonary hypertension. 5. Dense aortic valve calcifications. Correlate for echocardiographic evidence of aortic valve  dysfunction.  Aortic Atherosclerosis (ICD10-I70.0).   Electronically Signed By: Marolyn JONETTA Jaksch M.D. On: 03/05/2023 14:20   ECHOCARDIOGRAM  ECHOCARDIOGRAM COMPLETE 10/05/2023  Narrative ECHOCARDIOGRAM REPORT    Patient Name:   Kirk [name] Date of Exam: 10/05/2023 Medical Rec #:  [id number]       Height:       67.0 in Accession #:    [id number]      Weight:       185.4 lb Date of Birth:  [birth year]        BSA:          1.958 m Patient Age:    63 years        BP:           100/60 mmHg Patient Gender: M               HR:           74 bpm. Exam Location:  Inpatient  Procedure: 2D Echo, Cardiac Doppler and Color Doppler (Both Spectral and Color Flow Doppler were utilized during procedure).  Indications:    Nonischemic Cardiomyopathy I42.8  History:        Patient has no prior history of Echocardiogram examinations, most recent 03/18/2023. CHF and Cardiomyopathy, COPD and ESRD, Arrythmias:Atrial Fibrillation, Signs/Symptoms:Hypotension and Chest Pain; Risk Factors:Hypertension, Sleep Apnea, Diabetes and Dyslipidemia.  Sonographer:    Thea Norlander RCS Referring Phys: ALM ORN HARDING  IMPRESSIONS   1. Left ventricular ejection fraction, by estimation, is 25 to 30%. The left ventricle has severely decreased function. The left ventricle demonstrates global hypokinesis. Left ventricular diastolic  parameters are consistent with Grade II diastolic dysfunction (pseudonormalization). Elevated left atrial pressure. The E/e' is 19.5. 2. Right ventricular systolic function is normal. The right ventricular size is normal. There is moderately elevated pulmonary artery systolic pressure. The estimated right ventricular systolic pressure is 45.5 mmHg. 3. Left atrial size was moderately dilated. 4. Right atrial size was moderately dilated. 5. Moderate mitral subvalvular calcification. 6. The mitral valve is degenerative. Mild to moderate mitral valve regurgitation. 7. The aortic valve was  not well visualized. Aortic valve regurgitation is not visualized. Moderate to severe aortic valve stenosis (peak velocity 3.23m/s, MG , AVA VTI 1.04cm2, SVi 35, DI 0.27) consider severe Low flow low gradient AS. 8. The inferior vena cava is dilated in size with <50% respiratory variability, suggesting right atrial pressure of 15 mmHg.  Comparison(s): A prior study was performed on 03/18/2023. LVEF 45-50%, global hypokinesis, G2DD, RVSP 59.68mmHg, moderate LAE, mild RAE, mild to moderate AS, RAP .  FINDINGS Left Ventricle: Left ventricular ejection fraction, by estimation, is 25 to 30%. The left ventricle has severely decreased function. The left ventricle demonstrates global hypokinesis. The left ventricular internal cavity size was normal in size. There is borderline left ventricular hypertrophy. Left ventricular diastolic parameters are consistent with Grade II diastolic dysfunction (pseudonormalization). Elevated left atrial pressure. The E/e' is 19.5.  Right Ventricle: The right ventricular size is normal. No increase in right ventricular wall thickness. Right ventricular systolic function is normal. There is moderately elevated pulmonary artery systolic pressure. The tricuspid regurgitant velocity is 2.76 m/s, and with an assumed right atrial pressure of 15 mmHg, the estimated right ventricular systolic pressure is 45.5 mmHg.  Left Atrium: Left atrial size was moderately dilated.  Right Atrium: Right atrial size was moderately dilated.  Pericardium: There is no evidence of pericardial effusion.  Mitral Valve: The mitral valve is degenerative in appearance. Mild to moderate mitral annular calcification. Moderate mitral subvalvular calcification. Mild to moderate mitral valve regurgitation.  Tricuspid Valve: The tricuspid valve is not well visualized. Tricuspid valve regurgitation is trivial. No evidence of tricuspid stenosis.  Aortic Valve: The aortic valve was not well  visualized. Aortic valve regurgitation is not visualized. Moderate to severe aortic valve stenosis (peak velocity 3.33m/s, MG , AVA VTI 1.04cm2, SVi 35, DI 0.27) consider severe Low flow low gradient AS. Aortic valve mean gradient measures 22.0 mmHg. Aortic valve peak gradient measures 38.7 mmHg. Aortic valve area, by VTI measures 1.04 cm.  Pulmonic Valve: The pulmonic valve was normal in structure. Pulmonic valve regurgitation is not visualized. No evidence of pulmonic stenosis.  Aorta: The aortic root and ascending aorta are structurally normal, with no evidence of dilitation.  Venous: The inferior vena cava is dilated in size with less than 50% respiratory variability, suggesting right atrial pressure of 15 mmHg.  IAS/Shunts: The atrial septum is grossly normal.   LEFT VENTRICLE PLAX 2D LVIDd:         5.60 cm   Diastology LVIDs:         4.30 cm   LV e' medial:    5.00 cm/s LV PW:         1.30 cm   LV E/e' medial:  21.6 LV IVS:        1.10 cm   LV e' lateral:   6.53 cm/s LVOT diam:     2.20 cm   LV E/e' lateral: 16.5 LV SV:         69 LV SV Index:   35 LVOT  Area:     3.80 cm   RIGHT VENTRICLE             IVC RV S prime:     14.00 cm/s  IVC diam: 2.80 cm TAPSE (M-mode): 2.4 cm  LEFT ATRIUM             Index        RIGHT ATRIUM           Index LA diam:        4.30 cm 2.20 cm/m   RA Area:     26.90 cm LA Vol (A2C):   88.3 ml 45.09 ml/m  RA Volume:   93.30 ml  47.65 ml/m LA Vol (A4C):   77.6 ml 39.63 ml/m LA Biplane Vol: 83.4 ml 42.59 ml/m AORTIC VALVE AV Area (Vmax):    1.11 cm AV Area (Vmean):   1.16 cm AV Area (VTI):     1.04 cm AV Vmax:           311.00 cm/s AV Vmean:          204.333 cm/s AV VTI:            0.661 m AV Peak Grad:      38.7 mmHg AV Mean Grad:      22.0 mmHg LVOT Vmax:         91.20 cm/s LVOT Vmean:        62.533 cm/s LVOT VTI:          0.181 m LVOT/AV VTI ratio: 0.27  AORTA Ao Root diam: 3.10 cm Ao Asc diam:  3.30 cm  MITRAL VALVE                 TRICUSPID VALVE MV Area (PHT): 4.49 cm     TR Peak grad:   30.5 mmHg MV Decel Time: 169 msec     TR Vmax:        276.00 cm/s MV E velocity: 108.00 cm/s MV A velocity: 83.30 cm/s   SHUNTS MV E/A ratio:  1.30         Systemic VTI:  0.18 m Systemic Diam: 2.20 cm  Sunit Tolia Electronically signed by Madonna Large Signature Date/Time: 10/05/2023/3:40:31 PM    Final          ______________________________________________________________________________________________      Risk Assessment/Calculations          Physical Exam VS:  BP (!) 170/72   Pulse 69   Ht 5' 7 (1.702 m)   Wt 188 lb (85.3 kg)   SpO2 97%   BMI 29.44 kg/m        Wt Readings from Last 3 Encounters:  10/16/23 188 lb (85.3 kg)  10/07/23 181 lb 7 oz (82.3 kg)  07/03/23 182 lb 9.6 oz (82.8 kg)    GEN: Well nourished, well developed in no acute distress NECK: No JVD; No carotid bruits CARDIAC: RRR, no murmurs, rubs, gallops RESPIRATORY:  Clear to auscultation without rales, wheezing or rhonchi  ABDOMEN: Soft, non-tender, non-distended EXTREMITIES:  No edema; No deformity   ASSESSMENT AND PLAN  Resistant hypertension: I will increase his hydralazine  to 50 mg 3 times a day.  Severe aortic stenosis: Will refer the patient to structural heart clinic for consideration of TAVR procedure  ESRD: On dialysis Monday Wednesday Friday  Chronic systolic and diastolic heart failure: Volume managed by dialysis.        Dispo: Follow-up with Dr. Mona next month  Signed, Pookela Sellin, [place]

## 2023-10-17 DIAGNOSIS — D631 Anemia in chronic kidney disease: Secondary | ICD-10-CM | POA: Diagnosis not present

## 2023-10-17 DIAGNOSIS — D509 Iron deficiency anemia, unspecified: Secondary | ICD-10-CM | POA: Diagnosis not present

## 2023-10-17 DIAGNOSIS — N186 End stage renal disease: Secondary | ICD-10-CM | POA: Diagnosis not present

## 2023-10-17 DIAGNOSIS — N2581 Secondary hyperparathyroidism of renal origin: Secondary | ICD-10-CM | POA: Diagnosis not present

## 2023-10-17 DIAGNOSIS — Z992 Dependence on renal dialysis: Secondary | ICD-10-CM | POA: Diagnosis not present

## 2023-10-18 DIAGNOSIS — N2581 Secondary hyperparathyroidism of renal origin: Secondary | ICD-10-CM | POA: Diagnosis not present

## 2023-10-18 DIAGNOSIS — D509 Iron deficiency anemia, unspecified: Secondary | ICD-10-CM | POA: Diagnosis not present

## 2023-10-18 DIAGNOSIS — Z992 Dependence on renal dialysis: Secondary | ICD-10-CM | POA: Diagnosis not present

## 2023-10-18 DIAGNOSIS — D631 Anemia in chronic kidney disease: Secondary | ICD-10-CM | POA: Diagnosis not present

## 2023-10-18 DIAGNOSIS — N186 End stage renal disease: Secondary | ICD-10-CM | POA: Diagnosis not present

## 2023-10-21 DIAGNOSIS — Z992 Dependence on renal dialysis: Secondary | ICD-10-CM | POA: Diagnosis not present

## 2023-10-21 DIAGNOSIS — D631 Anemia in chronic kidney disease: Secondary | ICD-10-CM | POA: Diagnosis not present

## 2023-10-21 DIAGNOSIS — N2581 Secondary hyperparathyroidism of renal origin: Secondary | ICD-10-CM | POA: Diagnosis not present

## 2023-10-21 DIAGNOSIS — N186 End stage renal disease: Secondary | ICD-10-CM | POA: Diagnosis not present

## 2023-10-21 DIAGNOSIS — D509 Iron deficiency anemia, unspecified: Secondary | ICD-10-CM | POA: Diagnosis not present

## 2023-10-23 DIAGNOSIS — N2581 Secondary hyperparathyroidism of renal origin: Secondary | ICD-10-CM | POA: Diagnosis not present

## 2023-10-23 DIAGNOSIS — D509 Iron deficiency anemia, unspecified: Secondary | ICD-10-CM | POA: Diagnosis not present

## 2023-10-23 DIAGNOSIS — T8612 Kidney transplant failure: Secondary | ICD-10-CM | POA: Diagnosis not present

## 2023-10-23 DIAGNOSIS — D631 Anemia in chronic kidney disease: Secondary | ICD-10-CM | POA: Diagnosis not present

## 2023-10-23 DIAGNOSIS — Z992 Dependence on renal dialysis: Secondary | ICD-10-CM | POA: Diagnosis not present

## 2023-10-23 DIAGNOSIS — N186 End stage renal disease: Secondary | ICD-10-CM | POA: Diagnosis not present

## 2023-10-25 ENCOUNTER — Telehealth: Payer: Self-pay

## 2023-10-25 DIAGNOSIS — D631 Anemia in chronic kidney disease: Secondary | ICD-10-CM | POA: Diagnosis not present

## 2023-10-25 DIAGNOSIS — D509 Iron deficiency anemia, unspecified: Secondary | ICD-10-CM | POA: Diagnosis not present

## 2023-10-25 DIAGNOSIS — N186 End stage renal disease: Secondary | ICD-10-CM | POA: Diagnosis not present

## 2023-10-25 DIAGNOSIS — N2581 Secondary hyperparathyroidism of renal origin: Secondary | ICD-10-CM | POA: Diagnosis not present

## 2023-10-25 DIAGNOSIS — Z992 Dependence on renal dialysis: Secondary | ICD-10-CM | POA: Diagnosis not present

## 2023-10-25 NOTE — Telephone Encounter (Signed)
 Outgoing call placed to Val, another representative took the call. I informed her patient has a pulmonary doctor and calls regarding oxygen  should be directed to them (contact info was provided)

## 2023-10-25 NOTE — Telephone Encounter (Signed)
 Copied from CRM [id number]. Topic: General - Other >> Oct 25, 2023 10:17 AM Mercer PEDLAR wrote: Reason for CRM: Val calling from [employer] patient's oxygen  provider. She is calling regarding request for notes with prescription for oxygen  to be signed which was sent on  10/07/23  Callback: [phone]

## 2023-10-28 ENCOUNTER — Telehealth: Payer: Self-pay

## 2023-10-28 DIAGNOSIS — D509 Iron deficiency anemia, unspecified: Secondary | ICD-10-CM | POA: Diagnosis not present

## 2023-10-28 DIAGNOSIS — D631 Anemia in chronic kidney disease: Secondary | ICD-10-CM | POA: Diagnosis not present

## 2023-10-28 DIAGNOSIS — N2581 Secondary hyperparathyroidism of renal origin: Secondary | ICD-10-CM | POA: Diagnosis not present

## 2023-10-28 DIAGNOSIS — N186 End stage renal disease: Secondary | ICD-10-CM | POA: Diagnosis not present

## 2023-10-28 DIAGNOSIS — Z992 Dependence on renal dialysis: Secondary | ICD-10-CM | POA: Diagnosis not present

## 2023-10-28 NOTE — Telephone Encounter (Signed)
 I was unable to find pt in airview. I called and spoke to [facility] with [employer]. Arvella states that he would have to request a download from [employer] as he does not have access to all of that yet. Arvella was given B pod fax number and will fax a copy when he receives it. NFN

## 2023-10-29 ENCOUNTER — Ambulatory Visit: Admitting: Primary Care
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
## 2023-10-30 DIAGNOSIS — N2581 Secondary hyperparathyroidism of renal origin: Secondary | ICD-10-CM | POA: Diagnosis not present

## 2023-10-30 DIAGNOSIS — D509 Iron deficiency anemia, unspecified: Secondary | ICD-10-CM | POA: Diagnosis not present

## 2023-10-30 DIAGNOSIS — Z992 Dependence on renal dialysis: Secondary | ICD-10-CM | POA: Diagnosis not present

## 2023-10-30 DIAGNOSIS — N186 End stage renal disease: Secondary | ICD-10-CM | POA: Diagnosis not present

## 2023-10-30 DIAGNOSIS — D631 Anemia in chronic kidney disease: Secondary | ICD-10-CM | POA: Diagnosis not present

## 2023-10-31 NOTE — Progress Notes (Signed)
 Patient ID: Kirk [name] MRN: [id number] DOB/AGE: 1960-10-08 63 y.o.  Primary Care Physician:Ngetich, Roxan [name], NP Primary Cardiologist: Hilty  CC:  Aortic valvular disease management     FOCUSED PROBLEM LIST:   Aortic stenosis AVA 1.04, SVI 35, DI 0.27, MG 20, V-max 3.1, EF 25 to 30% TTE September 2025 EKG sinus tachycardia with LVH with nonspecific ST and T wave changes CAD Minimal coronary angiography September 2025 ESRD on HD MWFri On HD since ~2010 Hypertension Hyperlipidemia Aortic atherosclerosis Chest CT 2024 BMI 29/BSA 2.0  October 2025:  Patient consents to use of AI scribe. The patient is a 63 year old male with the above listed medical problems referred for recommendations regarding his aortic valvular disease.  He was most recently admitted to [facility] in September due to an abnormal EKG and low-grade fever.  Angiography was reassuring.  Viral panel was positive for rhinovirus.  Echocardiogram demonstrated low-flow low gradient aortic stenosis with an ejection fraction of 25 to 30%.  He was seen in outpatient follow-up few weeks ago.  At that point in time he was doing well and was without complaints.  He undergoes dialysis on Monday, Wednesday, and Friday, but experiences frequent interruptions due to hypotension, occurring with every treatment for the past two to three months. His weight has been fluctuating during this period.  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 He often needs to stop and rest, sometimes requiring the use of supplemental oxygen . He has not been able to perform these activities as needed for over a year.  He reports experiencing chest pain, notably a severe episode last night while lying down and watching TV. He describes the sensation as a 'heartbeat' that he 'hates'.  He occasionally experiences lightheadedness or dizziness, particularly when transitioning  from sitting to standing quickly. He also sometimes experiences presyncope or blacking out spells at home.  He uses a CPAP machine to assist with breathing while sleeping. He has been on dialysis for 15 years, starting in 2010, and uses a fistula for the procedure.  He has no teeth.        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)    Anxiety    Asthma    Asthma    moderate persistent   Carpal tunnel syndrome    Cellulitis and abscess of trunk    Cholelithiasis 07/13/2014   Chronic headaches    Cigarette smoker 07/11/2017   Stopped 2/022  - referred to start Lung cancer screening 04/2021         Debility, unspecified    Dermatophytosis of the body    Dysrhythmia    history of   Edema    End stage renal disease on dialysis (HCC)    MWF; [facility] in [place] (10/21/2014)   Essential hypertension, benign    Generalized anxiety disorder 05/25/2022   GERD (gastroesophageal reflux disease)    Gout, unspecified    History of renal transplant    HTN (hypertension)    Hypertrophy of prostate without urinary obstruction and other lower urinary tract symptoms (LUTS)    Hypotension, unspecified    Hypoxia 03/21/2023   Impotence of organic origin    Insomnia, unspecified    Localization-related (focal) (partial) epilepsy and epileptic syndromes  with complex partial seizures, without mention of intractable epilepsy    12-15-19- Wife states he has NEVER had a seizure    Lumbago    Memory loss    OSA on CPAP    Other and unspecified hyperlipidemia    controlled /managed per wife    Other chronic nonalcoholic liver disease    Other malaise and fatigue    Other nonspecific abnormal serum enzyme levels    Pain in joint, lower leg    Pain in joint, upper arm    Pneumonia several times   PONV (postoperative nausea and  vomiting)    Secondary hyperparathyroidism (of renal origin)    Shortness of breath    Sleep apnea    wears cpap    Tension headache    Unspecified constipation    Unspecified essential hypertension    Unspecified hereditary and idiopathic peripheral neuropathy    Unspecified vitamin D  deficiency     Past Surgical History:  Procedure Laterality Date   A/V FISTULAGRAM N/A 03/28/2023   Procedure: A/V Fistulagram;  Surgeon: Melia Lynwood ORN, MD;  Location: [facility] INVASIVE CV LAB;  Service: Cardiovascular;  Laterality: N/A;   AV FISTULA PLACEMENT Left ?2010   forearm; at [facility]   BACK SURGERY     CARDIAC CATHETERIZATION  03/21/2011   CHOLECYSTECTOMY N/A 10/21/2014   Procedure: LAPAROSCOPIC CHOLECYSTECTOMY WITH INTRAOPERATIVE CHOLANGIOGRAM;  Surgeon: Deward Null III, MD;  Location[facility] OR;  Service: General;  Laterality: N/A;   COLONOSCOPY     ESOPHAGOGASTRODUODENOSCOPY N/A 06/21/2023   Procedure: EGD (ESOPHAGOGASTRODUODENOSCOPY);  Surgeon: Legrand Victory LITTIE DOUGLAS, MD;  Location: [facility] ENDOSCOPY;  Service: Gastroenterology;  Laterality: N/A;   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   IR ANGIO INTRA EXTRACRAN SEL INTERNAL CAROTID UNI R MOD SED  11/30/2021   IR ANGIOGRAM FOLLOW UP STUDY  11/30/2021   IR AV DIALY SHUNT INTRO NEEDLE/INTRACATH INITIAL W/PTA/IMG LEFT  12/13/2022   IR BONE MARROW BIOPSY & ASPIRATION  03/27/2022   IR NEURO EACH ADD'L AFTER BASIC UNI RIGHT (MS)  11/30/2021   IR TRANSCATH/EMBOLIZ  11/30/2021   IR US  GUIDE VASC ACCESS LEFT  12/13/2022   KIDNEY TRANSPLANT  08/17/2011   [facility]    LAPAROSCOPIC CHOLECYSTECTOMY  10/21/2014   w/IOC   LEFT HEART CATH AND CORONARY ANGIOGRAPHY N/A 04/09/2023   Procedure: LEFT HEART CATH AND CORONARY ANGIOGRAPHY;  Surgeon: Anner Alm ORN, MD;  Location: [facility];  Service: Cardiovascular;  Laterality: N/A;   LEFT HEART CATH AND CORONARY ANGIOGRAPHY N/A 10/04/2023   Procedure: LEFT HEART CATH AND CORONARY ANGIOGRAPHY;   Surgeon: Anner Alm ORN, MD;  Location: [facility] INVASIVE CV LAB;  Service: Cardiovascular;  Laterality: N/A;   LEFT HEART CATHETERIZATION WITH CORONARY ANGIOGRAM N/A 03/21/2011   Procedure: LEFT HEART CATHETERIZATION WITH CORONARY ANGIOGRAM;  Surgeon: Vinie KYM Maxcy, MD;  Location: [facility] CATH LAB;  Service: Cardiovascular;  Laterality: N/A;   NEPHRECTOMY  08/2013   removed transplaned kidney   PERIPHERAL VASCULAR BALLOON ANGIOPLASTY Left 03/28/2023   Procedure: PERIPHERAL VASCULAR BALLOON ANGIOPLASTY;  Surgeon: Melia Lynwood ORN, MD;  Location: [facility] INVASIVE CV LAB;  Service: Cardiovascular;  Laterality: Left;  80% outflow cephalic   POLYPECTOMY     POSTERIOR FUSION CERVICAL SPINE  06/25/2012   for spinal stenosis   RADIOLOGY WITH ANESTHESIA N/A 11/30/2021   Procedure: RIGHT MIDDLE MENINGEAL ARTERY EMBOLIZATION;  Surgeon: Radiologist, Medication, MD;  Location: [facility] OR;  Service: Radiology;  Laterality: N/A;  VASECTOMY  2010    Family History  Adopted: Yes  Problem Relation Age of Onset   Colon cancer Neg Hx    Esophageal cancer Neg Hx    Rectal cancer Neg Hx    Stomach cancer Neg Hx    Colon polyps Neg Hx     Social History   Socioeconomic History   Marital status: Married    Spouse name: Edna   Number of children: 3   Years of education: Not on file   Highest education level: Not on file  Occupational History   Occupation: disabled    Employer: DISABLED  Tobacco Use   Smoking status: Former    Current packs/day: 0.00    Average packs/day: 0.5 packs/day for 32.0 years (16.0 ttl pk-yrs)    Types: Cigarettes    Start date: 9    Quit date: 02/24/2020    Years since quitting: 3.6   Smokeless tobacco: Never  Vaping Use   Vaping status: Never Used  Substance and Sexual Activity   Alcohol use: No    Alcohol/week: 0.0 standard drinks of alcohol   Drug use: No   Sexual activity: Yes  Other Topics Concern   Not on file  Social History Narrative   Drinks 1 cup of caffeine daily.   Social  Drivers of Health   Financial Resource Strain: Medium Risk (11/02/2021)   Overall Financial Resource Strain (CARDIA)    Difficulty of Paying Living Expenses: Somewhat hard  Food Insecurity: No Food Insecurity (06/20/2023)   Hunger Vital Sign    Worried About Running Out of Food in the Last Year: Never true    Ran Out of Food in the Last Year: Never true  Transportation Needs: No Transportation Needs (06/20/2023)   PRAPARE - Administrator, Civil Service (Medical): No    Lack of Transportation (Non-Medical): No  Physical Activity: Insufficiently Active (06/25/2017)   Exercise Vital Sign    Days of Exercise per Week: 3 days    Minutes of Exercise per Session: 20 min  Stress: No Stress Concern Present (06/25/2017)   [employer] of Occupational Health - Occupational Stress Questionnaire    Feeling of Stress : Only a little  Social Connections: Patient Declined (03/17/2023)   Social Connection and Isolation Panel    Frequency of Communication with Friends and Family: Patient declined    Frequency of Social Gatherings with Friends and Family: Patient declined    Attends Religious Services: Patient declined    Database administrator or Organizations: Patient declined    Attends Banker Meetings: Patient declined    Marital Status: Patient declined  Intimate Partner Violence: Not At Risk (06/20/2023)   Humiliation, Afraid, Rape, and Kick questionnaire    Fear of Current or Ex-Partner: No    Emotionally Abused: No    Physically Abused: No    Sexually Abused: No     Prior to Admission medications   Medication Sig Start Date End Date Taking? Authorizing Provider  acetaminophen  (TYLENOL ) 500 MG tablet Take 1 tablet (500 mg total) by mouth every 6 (six) hours as needed for moderate pain. Patient taking differently: Take 1,000 mg by mouth every 6 (six) hours as needed for moderate pain (pain score 4-6) or headache. 09/12/14   Jadine Toribio SQUIBB, MD  albuterol   (PROVENTIL ) (2.5 MG/3ML) 0.083% nebulizer solution Take 3 mLs (2.5 mg total) by nebulization every 4 (four) hours as needed for wheezing or shortness of breath. 06/25/23  Elgergawy, Brayton RAMAN, MD  albuterol  (VENTOLIN  HFA) 108 (90 Base) MCG/ACT inhaler Inhale 2 puffs into the lungs every 6 (six) hours as needed for wheezing or shortness of breath. 10/29/22   Darlean Ozell NOVAK, MD  aspirin  EC 81 MG tablet Take 81 mg by mouth daily in the afternoon.    [provider]  Cholecalciferol  (VITAMIN D3) 25 MCG (1000 UT) CAPS Take 4,000 Units by mouth See admin instructions. Patient takes Earlean Everts, Sat, Sun    [provider]  doxazosin  (CARDURA ) 4 MG tablet Take 1 tablet (4 mg total) by mouth at bedtime. 05/31/23   Vannie Reche RAMAN, NP  fluticasone -salmeterol (ADVAIR HFA) 45-21 MCG/ACT inhaler  07/05/23   Darlean Ozell NOVAK, MD  guaiFENesin  (MUCINEX ) 600 MG 12 hr tablet Take 1,200 mg by mouth 2 (two) times daily as needed for cough or to loosen phlegm.    [provider]  hydrALAZINE  (APRESOLINE ) 50 MG tablet Take 1 tablet (50 mg total) by mouth 3 (three) times daily. 10/16/23 01/14/24  Meng, Hao, PA  ipratropium-albuterol  (DUONEB) 0.5-2.5 (3) MG/3ML SOLN Take 3 mLs by nebulization every 6 (six) hours. Please take every 6 hours scheduled for next 5 days then as needed. 06/25/23   Elgergawy, Brayton RAMAN, MD  isosorbide  mononitrate (IMDUR ) 60 MG 24 hr tablet Take 60 mg by mouth daily. 10/04/22   [provider]  metoprolol  succinate (TOPROL -XL) 25 MG 24 hr tablet Take 1 tablet (25 mg total) by mouth daily. 10/07/23 11/06/23  Pahwani, Ravi, MD  multivitamin (RENA-VIT) TABS tablet Take 1 tablet by mouth daily. Patient taking differently: Take 1 tablet by mouth See admin instructions. Take 1 tablet by mouth once daily in the afternoon. 01/29/23   Ngetich, Roxan [name], NP  OXYGEN  Inhale into the lungs. 2 L  cont.    [provider]  pravastatin  (PRAVACHOL ) 40 MG tablet TAKE 1 TABLET BY MOUTH  EVERY DAY IN THE EVENING Patient taking differently: Take 40 mg by mouth at bedtime. TAKE 1 TABLET BY MOUTH EVERY DAY IN THE EVENING 12/04/22   Ngetich, Dinah C, NP  sevelamer  carbonate (RENVELA ) 2.4 g PACK Take 4.8 g by mouth 3 (three) times daily with meals. Also takes with his snacks    [provider]  sucroferric oxyhydroxide (VELPHORO ) 500 MG chewable tablet Chew 1,000-1,500 mg by mouth See admin instructions. Take 3 tablets (1500mg ) by mouth three times daily with meals and take 2 tablets (1000mg ) with snacks.    [provider]  traMADol  (ULTRAM ) 50 MG tablet Take 50 mg by mouth daily as needed for severe pain (pain score 7-10).    [provider]  traZODone  (DESYREL ) 50 MG tablet TAKE 0.5-1 TABLET (25-50 MG TOTAL) BY MOUTH AT BEDTIME AS NEEDED. FOR SLEEP Patient taking differently: Take 50 mg by mouth at bedtime. 04/08/23   Ngetich, Dinah C, NP  UNABLE TO FIND Inhale 1 Device into the lungs at bedtime. CPAP    [provider]    Allergies  Allergen Reactions   Codeine Nausea And Vomiting   Hydrocodone -Acetaminophen  Nausea And Vomiting    REVIEW OF SYSTEMS:  General: no fevers/chills/night sweats Eyes: no blurry vision, diplopia, or amaurosis ENT: no sore throat or hearing loss Resp: no cough, wheezing, or hemoptysis CV: no edema or palpitations GI: no abdominal pain, nausea, vomiting, diarrhea, or constipation GU: no dysuria, frequency, or hematuria Skin: no rash Neuro: no headache, numbness, tingling, or weakness of extremities Musculoskeletal: no joint pain or swelling  Heme: no bleeding, DVT, or easy bruising Endo: no polydipsia or polyuria  BP (!) 162/70   Pulse 65   Ht 5' 7 (1.702 m)   Wt 185 lb (83.9 kg)   SpO2 95%   BMI 28.98 kg/m   PHYSICAL EXAM: GEN:  AO x 3 in no acute distress HEENT: normal Dentition: No teeth Neck: JVP normal. +2 carotid upstrokes without bruits. No thyromegaly. Lungs: equal expansion, clear  bilaterally CV: Apex is discrete and nondisplaced, RRR with 3 out of 6 systolic ejection murmur Abd: soft, non-tender, non-distended; no bruit; positive bowel sounds Ext: no edema, ecchymoses, or cyanosis Vascular: 2+ femoral pulses, 2+ radial pulses; left forearm fistula intact      Skin: warm and dry without rash Neuro: CN II-XII grossly intact; motor and sensory grossly intact    DATA AND STUDIES:  EKG: 2025 normal sinus rhythm, nonspecific ST and T wave changes, likely LVH  EKG Interpretation Date/Time:    Ventricular Rate:    PR Interval:    QRS Duration:    QT Interval:    QTC Calculation:   R Axis:      Text Interpretation:          CARDIAC STUDIES: Refer to CV Procedures and Imaging Tabs  10/04/2023: ALT 10; B Natriuretic Peptide 2,277.3 10/05/2023: Magnesium  1.8 10/08/2023: BUN 23; Creatinine, Ser 7.09; Hemoglobin 9.4; Platelets 100; Potassium 3.9; Sodium 134   STS RISK CALCULATOR: Pending  NYHA CLASS: 2    ASSESSMENT AND PLAN:   1. Nonrheumatic aortic valve stenosis   2. NICM (nonischemic cardiomyopathy) (HCC)   3. Mild CAD   4. ESRD (end stage renal disease) (HCC)   5. Essential hypertension   6. Hyperlipidemia LDL goal <70     Aortic stenosis: The patient has developed NYHA class II symptoms of shortness of breath with exertion.  Additionally he is having problems with completing a full dialysis session.  I think this is likely attributable to his low-flow low gradient aortic stenosis.  He has a heavily calcified valve with restricted motion on his last echocardiogram.  I long discussion with the patient and he would like to pursue an evaluation for potential aortic valve intervention.  He is already had his coronary angiography study.  Will refer him for a CTA and cardiothoracic surgery appointment.  He has no teeth and we will defer a dental evaluation. Nonischemic cardiomyopathy: Continue hydralazine  50 mg 3 times daily, Imdur  60 mg daily, Toprol  25 mg  daily; defer SGLT2 inhibitor and Entresto. Mild CAD: Continue aspirin  81 mg, pravastatin  40 mg End-stage renal disease: Managed by other providers.  Has been on dialysis for about 15 years with a single fistula. Hypertension: Continue doxazosin  4 mg daily, Imdur  60 mg daily, hydralazine  50 mg 3 times daily, Toprol  25 mg daily Hyperlipidemia: Continue pravastatin  40 mg daily   I have personally reviewed the patients imaging data as summarized above.  I have reviewed the natural history of aortic stenosis with the patient and family members who are present today. We have discussed the limitations of medical therapy and the poor prognosis associated with symptomatic aortic stenosis. We have also reviewed potential treatment options, including palliative medical therapy, conventional surgical aortic valve replacement, and transcatheter aortic valve replacement. We discussed treatment options in the context of this patient's specific comorbid medical conditions.   All of the patient's questions were answered today. Will make further recommendations based on the results of studies outlined above.   I spent 45  minutes reviewing all clinical data during and prior to this visit including all relevant imaging studies, laboratories, clinical information from other health systems and prior notes from both Cardiology and other specialties, interviewing the patient, conducting a complete physical examination, and coordinating care in order to formulate a comprehensive and personalized evaluation and treatment plan.   Zeriah Baysinger K Sura Canul, MD  11/05/2023 12:48 PM    [facility] [address] [place], [place], [place]  [address] Phone: [phone]; Fax: [phone]

## 2023-11-01 DIAGNOSIS — Z992 Dependence on renal dialysis: Secondary | ICD-10-CM | POA: Diagnosis not present

## 2023-11-01 DIAGNOSIS — D631 Anemia in chronic kidney disease: Secondary | ICD-10-CM | POA: Diagnosis not present

## 2023-11-01 DIAGNOSIS — N2581 Secondary hyperparathyroidism of renal origin: Secondary | ICD-10-CM | POA: Diagnosis not present

## 2023-11-01 DIAGNOSIS — D509 Iron deficiency anemia, unspecified: Secondary | ICD-10-CM | POA: Diagnosis not present

## 2023-11-01 DIAGNOSIS — N186 End stage renal disease: Secondary | ICD-10-CM | POA: Diagnosis not present

## 2023-11-04 DIAGNOSIS — D509 Iron deficiency anemia, unspecified: Secondary | ICD-10-CM | POA: Diagnosis not present

## 2023-11-04 DIAGNOSIS — Z992 Dependence on renal dialysis: Secondary | ICD-10-CM | POA: Diagnosis not present

## 2023-11-04 DIAGNOSIS — N186 End stage renal disease: Secondary | ICD-10-CM | POA: Diagnosis not present

## 2023-11-04 DIAGNOSIS — D631 Anemia in chronic kidney disease: Secondary | ICD-10-CM | POA: Diagnosis not present

## 2023-11-04 DIAGNOSIS — N2581 Secondary hyperparathyroidism of renal origin: Secondary | ICD-10-CM | POA: Diagnosis not present

## 2023-11-05 ENCOUNTER — Encounter: Payer: Self-pay | Admitting: Internal Medicine

## 2023-11-05 ENCOUNTER — Ambulatory Visit: Attending: Internal Medicine | Admitting: Internal Medicine

## 2023-11-05 ENCOUNTER — Other Ambulatory Visit: Payer: Self-pay

## 2023-11-05 ENCOUNTER — Ambulatory Visit: Admitting: Family

## 2023-11-05 ENCOUNTER — Encounter (HOSPITAL_COMMUNITY): Payer: Self-pay

## 2023-11-05 VITALS — BP 162/70 | HR 65 | Ht 67.0 in | Wt 185.0 lb

## 2023-11-05 DIAGNOSIS — I428 Other cardiomyopathies: Secondary | ICD-10-CM | POA: Diagnosis not present

## 2023-11-05 DIAGNOSIS — I10 Essential (primary) hypertension: Secondary | ICD-10-CM | POA: Insufficient documentation

## 2023-11-05 DIAGNOSIS — I2510 Atherosclerotic heart disease of native coronary artery without angina pectoris: Secondary | ICD-10-CM | POA: Insufficient documentation

## 2023-11-05 DIAGNOSIS — N186 End stage renal disease: Secondary | ICD-10-CM | POA: Diagnosis not present

## 2023-11-05 DIAGNOSIS — I350 Nonrheumatic aortic (valve) stenosis: Secondary | ICD-10-CM | POA: Insufficient documentation

## 2023-11-05 DIAGNOSIS — E785 Hyperlipidemia, unspecified: Secondary | ICD-10-CM | POA: Diagnosis not present

## 2023-11-05 NOTE — Progress Notes (Signed)
 Pre Surgical Assessment: 5 M Walk Test  30M=16.79ft  5 Meter Walk Test- trial 1: 5.69 seconds 5 Meter Walk Test- trial 2: 6.41 seconds 5 Meter Walk Test- trial 3: 6.03 seconds 5 Meter Walk Test Average: 6.04 seconds

## 2023-11-05 NOTE — Patient Instructions (Signed)
 Medication Instructions:  No medication changes were made at this visit. Continue current regimen.   *If you need a refill on your cardiac medications before your next appointment, please call your pharmacy*  Lab Work: None ordered today. If you have labs (blood work) drawn today and your tests are completely normal, you will receive your results only by: MyChart Message (if you have MyChart) OR A paper copy in the mail If you have any lab test that is abnormal or we need to change your treatment, we will call you to review the results.  Testing/Procedures: Our office will be in contact with you to schedule the CT scans required for TAVR work-up.  Follow-Up: At [facility] you and your health needs are our priority.  As part of our continuing mission to provide you with exceptional heart care, our providers are all part of one team.  This team includes your primary Cardiologist (physician) and Advanced Practice Providers or APPs (Physician Assistants and Nurse Practitioners) who all work together to provide you with the care you need, when you need it.  Your next appointment:   Per Structural Heart Team

## 2023-11-06 DIAGNOSIS — N2581 Secondary hyperparathyroidism of renal origin: Secondary | ICD-10-CM | POA: Diagnosis not present

## 2023-11-06 DIAGNOSIS — Z992 Dependence on renal dialysis: Secondary | ICD-10-CM | POA: Diagnosis not present

## 2023-11-06 DIAGNOSIS — D631 Anemia in chronic kidney disease: Secondary | ICD-10-CM | POA: Diagnosis not present

## 2023-11-06 DIAGNOSIS — N186 End stage renal disease: Secondary | ICD-10-CM | POA: Diagnosis not present

## 2023-11-06 DIAGNOSIS — D509 Iron deficiency anemia, unspecified: Secondary | ICD-10-CM | POA: Diagnosis not present

## 2023-11-07 ENCOUNTER — Encounter (HOSPITAL_COMMUNITY): Payer: Self-pay | Admitting: Surgery

## 2023-11-07 ENCOUNTER — Ambulatory Visit (HOSPITAL_COMMUNITY)
Admission: RE | Admit: 2023-11-07 | Discharge: 2023-11-07 | Disposition: A | Discharge status: Home / Self Care | Attending: Surgery | Admitting: Surgery

## 2023-11-07 ENCOUNTER — Encounter (HOSPITAL_COMMUNITY)
Admission: RE | Disposition: A | Discharge status: Home / Self Care | Payer: Self-pay | Source: Home / Self Care | Attending: Surgery

## 2023-11-07 ENCOUNTER — Other Ambulatory Visit: Payer: Self-pay

## 2023-11-07 DIAGNOSIS — Y832 Surgical operation with anastomosis, bypass or graft as the cause of abnormal reaction of the patient, or of later complication, without mention of misadventure at the time of the procedure: Secondary | ICD-10-CM | POA: Diagnosis not present

## 2023-11-07 DIAGNOSIS — N186 End stage renal disease: Secondary | ICD-10-CM | POA: Diagnosis not present

## 2023-11-07 DIAGNOSIS — Z992 Dependence on renal dialysis: Secondary | ICD-10-CM | POA: Diagnosis not present

## 2023-11-07 DIAGNOSIS — T82858A Stenosis of vascular prosthetic devices, implants and grafts, initial encounter: Secondary | ICD-10-CM | POA: Insufficient documentation

## 2023-11-07 HISTORY — PX: VENOUS ANGIOPLASTY: CATH118376

## 2023-11-07 HISTORY — PX: A/V FISTULAGRAM: CATH118298

## 2023-11-07 SURGERY — A/V FISTULAGRAM
Anesthesia: LOCAL | Site: Arm Lower | Laterality: Left

## 2023-11-07 MED ORDER — FENTANYL CITRATE (PF) 100 MCG/2ML IJ SOLN
INTRAMUSCULAR | Status: AC
  Filled 2023-11-07: qty 2

## 2023-11-07 MED ORDER — FENTANYL CITRATE (PF) 100 MCG/2ML IJ SOLN
INTRAMUSCULAR | Status: DC | PRN
  Administered 2023-11-07: 50 ug via INTRAVENOUS

## 2023-11-07 MED ORDER — LIDOCAINE HCL (PF) 1 % IJ SOLN
INTRAMUSCULAR | Status: AC
  Filled 2023-11-07: qty 30

## 2023-11-07 MED ORDER — HEPARIN (PORCINE) IN NACL 1000-0.9 UT/500ML-% IV SOLN
INTRAVENOUS | Status: DC | PRN
Start: 2023-11-07 — End: 2023-11-07
  Administered 2023-11-07: 500 mL

## 2023-11-07 MED ORDER — LIDOCAINE HCL (PF) 1 % IJ SOLN
INTRAMUSCULAR | Status: DC | PRN
Start: 2023-11-07 — End: 2023-11-07
  Administered 2023-11-07: 5 mL via INTRADERMAL

## 2023-11-07 MED ORDER — IODIXANOL 320 MG/ML IV SOLN
INTRAVENOUS | Status: DC | PRN
Start: 2023-11-07 — End: 2023-11-07
  Administered 2023-11-07: 25 mL

## 2023-11-07 SURGICAL SUPPLY — 9 items
BALLOON MUSTANG 8.0X40 75 (BALLOONS) IMPLANT
DEVICE INFLATION ENCORE 26 (MISCELLANEOUS) IMPLANT
KIT MICROPUNCTURE NIT STIFF (SHEATH) IMPLANT
KIT PV (KITS) ×2 IMPLANT
SHEATH PINNACLE R/O II 6F 4CM (SHEATH) IMPLANT
SHEATH PROBE COVER 6X72 (BAG) IMPLANT
TRAY PV CATH (CUSTOM PROCEDURE TRAY) ×2 IMPLANT
TUBING CIL FLEX 10 FLL-RA (TUBING) IMPLANT
WIRE BENTSON .035X145CM (WIRE) IMPLANT

## 2023-11-07 NOTE — Op Note (Signed)
    Patient name: Kirk [name] MRN: [id number] DOB: 1960/02/16 Sex: male  11/07/2023 Pre-operative Diagnosis: ESRD Post-operative diagnosis:  Same Surgeon:  Malvina New Procedure Performed:  1.  Ultrasound right access, left cephalic vein  2.  Fistulogram  3.  Peripheral venoplasty     Indications: This is a 63 year old gentleman with trouble with his access.  He comes in today for fistulogram  Procedure:  The patient was identified in the holding area and taken to room 8.  The patient was then placed supine on the table and prepped and draped in the usual sterile fashion.  A time out was called.  Ultrasound was used to evaluate the fistula.  The vein was patent and compressible.  A digital ultrasound image was acquired.  The fistula was then accessed under ultrasound guidance using a micropuncture needle.  An 018 wire was then asvanced without resistance and a micropuncture sheath was placed.  Contrast injections were then performed through the sheath.  Findings: The central venous system is widely patent.  The arterial venous anastomosis was not well-visualized secondary to numerous proximal venous branches.  The fistula in the upper arm was widely patent.  There was an area in the mid forearm that was previously treated that has narrowed down slightly, less than 50%   Intervention: After the above images were acquired, I elected to intervene because there was a slight change in the appearance of what was treated last time and since he was here I felt that we should go ahead and postdilated.  Over a Bentson wire a 6 French sheath was placed.  I selected an 8 x 40 balloon and treated the area of concern in the forearm.  Follow-up imaging showed no stenosis.  I then evaluated his fistula with ultrasound.  There is a large collateral in the mid forearm.  The sheath was removed and closed with a Monocryl  Impression:  #1  Successful balloon venoplasty of a forearm cephalic vein stenosis  with a 8 mm balloon.  No residual stenosis is identified.  #2  If the patient continues to have trouble with dialysis, he has a large collateral coming off of the mid fistula that could be considered for ligation.     ALONSO Malvina New, M.D., [facility] [place] Office: [phone] Pager:  [phone]

## 2023-11-07 NOTE — H&P (Signed)
   Patient name: Kirk [name] MRN: [id number] DOB: [birth year] Sex: male  REASON FOR VISIT:     ESRD  HISTORY OF PRESENT ILLNESS:   Kirk [name] is a 63 y.o. male with ESRD who is having trouble with access.  He last underwent angio several months ago by Dr. Melia who treated the outflow.  He is back for further evaluation  CURRENT MEDICATIONS:    No current facility-administered medications for this encounter.   Facility-Administered Medications Ordered in Other Encounters  Medication Dose Route Frequency Provider Last Rate Last Admin   sodium chloride  flush (NS) 0.9 % injection 10 mL  10 mL Intravenous Q12H Christine Males, PA-C        REVIEW OF SYSTEMS:   [X]  denotes positive finding, [ ]  denotes negative finding Cardiac  Comments:  Chest pain or chest pressure:    Shortness of breath upon exertion:    Short of breath when lying flat:    Irregular heart rhythm:    Constitutional    Fever or chills:      PHYSICAL EXAM:   There were no vitals filed for this visit.  GENERAL: The patient is a well-nourished male, in no acute distress. The vital signs are documented above. CARDIOVASCULAR: There is a regular rate and rhythm. PULMONARY: Non-labored respirations Plapable thrill in access  STUDIES:      MEDICAL ISSUES:   ESRD:  discussed that we should proceed with a fistulogram to determine if the stenosis ahs returned and treat it if indicated.  All questions answered  Kirk Serene CLORE, MD, FACS [facility] [phone] Pager [phone]

## 2023-11-08 DIAGNOSIS — D509 Iron deficiency anemia, unspecified: Secondary | ICD-10-CM | POA: Diagnosis not present

## 2023-11-08 DIAGNOSIS — N186 End stage renal disease: Secondary | ICD-10-CM | POA: Diagnosis not present

## 2023-11-08 DIAGNOSIS — N2581 Secondary hyperparathyroidism of renal origin: Secondary | ICD-10-CM | POA: Diagnosis not present

## 2023-11-08 DIAGNOSIS — D631 Anemia in chronic kidney disease: Secondary | ICD-10-CM | POA: Diagnosis not present

## 2023-11-08 DIAGNOSIS — Z992 Dependence on renal dialysis: Secondary | ICD-10-CM | POA: Diagnosis not present

## 2023-11-11 DIAGNOSIS — Z992 Dependence on renal dialysis: Secondary | ICD-10-CM | POA: Diagnosis not present

## 2023-11-11 DIAGNOSIS — N186 End stage renal disease: Secondary | ICD-10-CM | POA: Diagnosis not present

## 2023-11-11 DIAGNOSIS — D509 Iron deficiency anemia, unspecified: Secondary | ICD-10-CM | POA: Diagnosis not present

## 2023-11-11 DIAGNOSIS — N2581 Secondary hyperparathyroidism of renal origin: Secondary | ICD-10-CM | POA: Diagnosis not present

## 2023-11-11 DIAGNOSIS — D631 Anemia in chronic kidney disease: Secondary | ICD-10-CM | POA: Diagnosis not present

## 2023-11-12 ENCOUNTER — Other Ambulatory Visit (HOSPITAL_COMMUNITY)

## 2023-11-13 DIAGNOSIS — N186 End stage renal disease: Secondary | ICD-10-CM | POA: Diagnosis not present

## 2023-11-13 DIAGNOSIS — D631 Anemia in chronic kidney disease: Secondary | ICD-10-CM | POA: Diagnosis not present

## 2023-11-13 DIAGNOSIS — Z992 Dependence on renal dialysis: Secondary | ICD-10-CM | POA: Diagnosis not present

## 2023-11-13 DIAGNOSIS — D509 Iron deficiency anemia, unspecified: Secondary | ICD-10-CM | POA: Diagnosis not present

## 2023-11-13 DIAGNOSIS — N2581 Secondary hyperparathyroidism of renal origin: Secondary | ICD-10-CM | POA: Diagnosis not present

## 2023-11-14 ENCOUNTER — Emergency Department (HOSPITAL_COMMUNITY)
Admission: EM | Admit: 2023-11-14 | Discharge: 2023-11-14 | Disposition: A | Discharge status: Home / Self Care | Attending: Student in an Organized Health Care Education/Training Program | Admitting: Student in an Organized Health Care Education/Training Program

## 2023-11-14 ENCOUNTER — Other Ambulatory Visit: Payer: Self-pay

## 2023-11-14 ENCOUNTER — Emergency Department (HOSPITAL_COMMUNITY)

## 2023-11-14 DIAGNOSIS — M542 Cervicalgia: Secondary | ICD-10-CM | POA: Diagnosis not present

## 2023-11-14 DIAGNOSIS — R739 Hyperglycemia, unspecified: Secondary | ICD-10-CM | POA: Insufficient documentation

## 2023-11-14 DIAGNOSIS — I7090 Unspecified atherosclerosis: Secondary | ICD-10-CM | POA: Diagnosis not present

## 2023-11-14 DIAGNOSIS — M25511 Pain in right shoulder: Secondary | ICD-10-CM | POA: Diagnosis not present

## 2023-11-14 DIAGNOSIS — R079 Chest pain, unspecified: Secondary | ICD-10-CM | POA: Diagnosis not present

## 2023-11-14 DIAGNOSIS — Z992 Dependence on renal dialysis: Secondary | ICD-10-CM | POA: Insufficient documentation

## 2023-11-14 DIAGNOSIS — R011 Cardiac murmur, unspecified: Secondary | ICD-10-CM | POA: Diagnosis not present

## 2023-11-14 DIAGNOSIS — N186 End stage renal disease: Secondary | ICD-10-CM | POA: Diagnosis not present

## 2023-11-14 DIAGNOSIS — M25519 Pain in unspecified shoulder: Secondary | ICD-10-CM | POA: Diagnosis not present

## 2023-11-14 DIAGNOSIS — I471 Supraventricular tachycardia, unspecified: Secondary | ICD-10-CM | POA: Diagnosis not present

## 2023-11-14 DIAGNOSIS — Z79899 Other long term (current) drug therapy: Secondary | ICD-10-CM | POA: Diagnosis not present

## 2023-11-14 DIAGNOSIS — I120 Hypertensive chronic kidney disease with stage 5 chronic kidney disease or end stage renal disease: Secondary | ICD-10-CM | POA: Diagnosis not present

## 2023-11-14 DIAGNOSIS — I2510 Atherosclerotic heart disease of native coronary artery without angina pectoris: Secondary | ICD-10-CM | POA: Diagnosis not present

## 2023-11-14 DIAGNOSIS — Z7982 Long term (current) use of aspirin: Secondary | ICD-10-CM | POA: Diagnosis not present

## 2023-11-14 DIAGNOSIS — Z981 Arthrodesis status: Secondary | ICD-10-CM | POA: Diagnosis not present

## 2023-11-14 DIAGNOSIS — R0789 Other chest pain: Secondary | ICD-10-CM | POA: Insufficient documentation

## 2023-11-14 DIAGNOSIS — R000 Tachycardia, unspecified: Secondary | ICD-10-CM | POA: Diagnosis not present

## 2023-11-14 DIAGNOSIS — I517 Cardiomegaly: Secondary | ICD-10-CM | POA: Diagnosis not present

## 2023-11-14 DIAGNOSIS — M19011 Primary osteoarthritis, right shoulder: Secondary | ICD-10-CM | POA: Diagnosis not present

## 2023-11-14 DIAGNOSIS — M47812 Spondylosis without myelopathy or radiculopathy, cervical region: Secondary | ICD-10-CM | POA: Diagnosis not present

## 2023-11-14 DIAGNOSIS — M47813 Spondylosis without myelopathy or radiculopathy, cervicothoracic region: Secondary | ICD-10-CM | POA: Diagnosis not present

## 2023-11-14 LAB — BASIC METABOLIC PANEL WITH GFR
Anion gap: 17 — ABNORMAL HIGH (ref 5–15)
BUN: 32 mg/dL — ABNORMAL HIGH (ref 8–23)
CO2: 26 mmol/L (ref 22–32)
Calcium: 8.6 mg/dL — ABNORMAL LOW (ref 8.9–10.3)
Chloride: 88 mmol/L — ABNORMAL LOW (ref 98–111)
Creatinine, Ser: 8.22 mg/dL — ABNORMAL HIGH (ref 0.61–1.24)
GFR, Estimated: 7 mL/min — ABNORMAL LOW (ref >60)
Glucose, Bld: 192 mg/dL — ABNORMAL HIGH (ref 70–99)
Potassium: 4.3 mmol/L (ref 3.5–5.1)
Sodium: 131 mmol/L — ABNORMAL LOW (ref 135–145)

## 2023-11-14 LAB — HEPATIC FUNCTION PANEL
ALT: 16 U/L (ref 0–44)
AST: 23 U/L (ref 15–41)
Albumin: 3.3 g/dL — ABNORMAL LOW (ref 3.5–5.0)
Alkaline Phosphatase: 71 U/L (ref 38–126)
Bilirubin, Direct: 0.2 mg/dL (ref 0.0–0.2)
Indirect Bilirubin: 0.9 mg/dL (ref 0.3–0.9)
Total Bilirubin: 1.1 mg/dL (ref 0.0–1.2)
Total Protein: 7.4 g/dL (ref 6.5–8.1)

## 2023-11-14 LAB — TROPONIN I (HIGH SENSITIVITY)
Troponin I (High Sensitivity): 49 ng/L — ABNORMAL HIGH (ref <18)
Troponin I (High Sensitivity): 49 ng/L — ABNORMAL HIGH (ref <18)

## 2023-11-14 LAB — CBC
HCT: 34.8 % — ABNORMAL LOW (ref 39.0–52.0)
Hemoglobin: 11.7 g/dL — ABNORMAL LOW (ref 13.0–17.0)
MCH: 32.1 pg (ref 26.0–34.0)
MCHC: 33.6 g/dL (ref 30.0–36.0)
MCV: 95.3 fL (ref 80.0–100.0)
Platelets: 70 K/uL — ABNORMAL LOW (ref 150–400)
RBC: 3.65 MIL/uL — ABNORMAL LOW (ref 4.22–5.81)
RDW: 15 % (ref 11.5–15.5)
WBC: 7.2 K/uL (ref 4.0–10.5)
nRBC: 0 % (ref 0.0–0.2)

## 2023-11-14 LAB — LIPASE, BLOOD: Lipase: 43 U/L (ref 11–51)

## 2023-11-14 MED ORDER — FENTANYL CITRATE (PF) 50 MCG/ML IJ SOSY
50.0000 ug | PREFILLED_SYRINGE | Freq: Once | INTRAMUSCULAR | Status: AC
  Administered 2023-11-14: 50 ug via INTRAVENOUS
  Filled 2023-11-14: qty 1

## 2023-11-14 MED ORDER — METHOCARBAMOL 500 MG PO TABS
500.0000 mg | ORAL_TABLET | Freq: Once | ORAL | Status: AC
  Administered 2023-11-14: 500 mg via ORAL
  Filled 2023-11-14: qty 1

## 2023-11-14 NOTE — ED Triage Notes (Addendum)
 Pt. Stated, I started having neck, right shoulder pain, and chest pain that began 2 days ago..I do have pins in my neck which is were it started.  Last dialysis was yesterday. Pt. Completed his treatment. Im suppose to have by-pass surgery but they have not called me yet.

## 2023-11-14 NOTE — ED Notes (Signed)
 Patient transported to X-ray

## 2023-11-14 NOTE — Discharge Instructions (Addendum)
 You were evaluated in the emergency department for your shoulder and neck pain.  Your lab work did not show any new changes.  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  We recommend you remain in a sling until you are able to follow-up. Please attend dialysis as instructed.

## 2023-11-14 NOTE — Progress Notes (Signed)
 Orthopedic Tech Progress Note Patient Details:  Kirk [name] Aug 31, 1960 [id number] Arm sling left at bedside  Patient ID: Kirk [name], male   DOB: 1960/02/08, 63 y.o.   MRN: [id number]  [name] 11/14/2023, 10:47 AM

## 2023-11-14 NOTE — ED Notes (Signed)
 Reviewed written d/c instructions with pt and all questions answered. Pt verbalized understanding. Pt left in stable condition with all belongings.

## 2023-11-14 NOTE — ED Notes (Signed)
 Extra Blue top drawn

## 2023-11-14 NOTE — ED Provider Notes (Signed)
 Kirk [name] Provider Note   CSN: [id number] Arrival date & time: 11/14/23  9273     Patient presents with: Shoulder Pain, Neck Pain, and Chest Pain   Kirk [name] is a 63 y.o. male.   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  He reports that the shoulder pain radiates to his neck and chest.  He reports that the symptoms started 2 days ago, however the radiation to the chest started today.  He did receive dialysis yesterday as scheduled. Patient was admitted on 10/04/2023 for possible ST elevation MI but was taken to the Cath Lab and this showed minimal CAD with an EF of 35- 40%.  Patient was last seen by cardiology in the outpatient setting on 11/05/2023 -he has some shortness of breath with exertion secondary to his aortic stenosis and they have referred him to CT surgery for evaluation and possible aortic valve intervention.  He denies any recent trauma attributing to this right sided shoulder pain.   Shoulder Pain Associated symptoms: neck pain   Neck Pain Associated symptoms: chest pain   Chest Pain      Prior to Admission medications   Medication Sig Start Date End Date Taking? Authorizing Provider  acetaminophen  (TYLENOL ) 500 MG tablet Take 1 tablet (500 mg total) by mouth every 6 (six) hours as needed for moderate pain. Patient taking differently: Take 1,000 mg by mouth every 6 (six) hours as needed for moderate pain (pain score 4-6) or headache. 09/12/14   Jadine Toribio SQUIBB, MD  albuterol  (PROVENTIL ) (2.5 MG/3ML) 0.083% nebulizer solution Take 3 mLs (2.5 mg total) by nebulization every 4 (four) hours as needed for wheezing or shortness of breath. 06/25/23   Elgergawy, Brayton RAMAN, MD   albuterol  (VENTOLIN  HFA) 108 (90 Base) MCG/ACT inhaler Inhale 2 puffs into the lungs every 6 (six) hours as needed for wheezing or shortness of breath. 10/29/22   Darlean Ozell KATHEE, MD  aspirin  EC 81 MG tablet Take 81 mg by mouth daily in the afternoon.    [provider]  Cholecalciferol  (VITAMIN D3) 25 MCG (1000 UT) CAPS Take 4,000 Units by mouth See admin instructions. Patient takes Earlean Everts, Sat, Sun    [provider]  doxazosin  (CARDURA ) 4 MG tablet Take 1 tablet (4 mg total) by mouth at bedtime. 05/31/23   Kirk, Caitlin S, NP  fluticasone -salmeterol (ADVAIR HFA) 45-21 MCG/ACT inhaler  07/05/23   Darlean Ozell KATHEE, MD  guaiFENesin  (MUCINEX ) 600 MG 12 hr tablet Take 1,200 mg by mouth 2 (two) times daily as needed for cough or to loosen phlegm.    [provider]  hydrALAZINE  (APRESOLINE ) 50 MG tablet Take 1 tablet (50 mg total) by mouth 3 (three) times daily. 10/16/23 01/14/24  Meng, Hao, PA  ipratropium-albuterol  (DUONEB) 0.5-2.5 (3) MG/3ML SOLN Take 3 mLs by nebulization every 6 (six) hours. Please take every 6 hours scheduled for next 5 days then as needed. 06/25/23   Elgergawy, Brayton RAMAN, MD  isosorbide  mononitrate (IMDUR ) 60 MG 24 hr tablet Take 60 mg by mouth daily. 10/04/22   [provider]  metoprolol  succinate (TOPROL -XL) 25 MG 24 hr tablet Take 1 tablet (25 mg total) by mouth daily. 10/07/23 11/06/23  Pahwani, Ravi, MD  multivitamin (RENA-VIT) TABS tablet Take  1 tablet by mouth daily. Patient taking differently: Take 1 tablet by mouth See admin instructions. Take 1 tablet by mouth once daily in the afternoon. 01/29/23   Ngetich, Roxan [name], NP  OXYGEN  Inhale into the lungs. 2 L  cont.    [provider]  pravastatin  (PRAVACHOL ) 40 MG tablet TAKE 1 TABLET BY MOUTH EVERY DAY IN THE EVENING Patient taking differently: Take 40 mg by mouth at bedtime. TAKE 1 TABLET BY MOUTH EVERY DAY IN THE EVENING 12/04/22   Ngetich, Dinah C, NP  sevelamer  carbonate  (RENVELA ) 2.4 g PACK Take 4.8 g by mouth 3 (three) times daily with meals. Also takes with his snacks    [provider]  sucroferric oxyhydroxide (VELPHORO ) 500 MG chewable tablet Chew 1,000-1,500 mg by mouth See admin instructions. Take 3 tablets (1500mg ) by mouth three times daily with meals and take 2 tablets (1000mg ) with snacks.    [provider]  traMADol  (ULTRAM ) 50 MG tablet Take 50 mg by mouth daily as needed for severe pain (pain score 7-10).    [provider]  traZODone  (DESYREL ) 50 MG tablet TAKE 0.5-1 TABLET (25-50 MG TOTAL) BY MOUTH AT BEDTIME AS NEEDED. FOR SLEEP Patient taking differently: Take 50 mg by mouth at bedtime. 04/08/23   Ngetich, Dinah C, NP  UNABLE TO FIND Inhale 1 Device into the lungs at bedtime. CPAP    [provider]    Allergies: Codeine and Hydrocodone -acetaminophen     Review of Systems  Cardiovascular:  Positive for chest pain.  Musculoskeletal:  Positive for neck pain.  All other systems reviewed and are negative.   Updated Vital Signs BP (!) 146/66   Pulse 73   Temp 97.7 F (36.5 C) (Temporal)   Resp 13   SpO2 99%   Physical Exam Vitals and nursing note reviewed.  Constitutional:      General: He is in acute distress.  HENT:     Head: Atraumatic.  Eyes:     Pupils: Pupils are equal, round, and reactive to light.  Cardiovascular:     Rate and Rhythm: Normal rate.     Heart sounds: Murmur heard.  Pulmonary:     Effort: Pulmonary effort is normal.  Chest:     Chest wall: Tenderness present.  Abdominal:     Palpations: Abdomen is soft.  Musculoskeletal:     Comments: Pain with active and passive range of motion in the right shoulder.  No deformities  Skin:    General: Skin is warm.  Neurological:     Mental Status: He is alert and oriented to person, place, and time.     (all labs ordered are listed, but only abnormal results are displayed) Labs Reviewed  BASIC METABOLIC PANEL WITH GFR -  Abnormal; Notable for the following components:      Result Value   Sodium 131 (*)    Chloride 88 (*)    Glucose, Bld 192 (*)    BUN 32 (*)    Creatinine, Ser 8.22 (*)    Calcium  8.6 (*)    GFR, Estimated 7 (*)    Anion gap 17 (*)    All other components within normal limits  CBC - Abnormal; Notable for the following components:   RBC 3.65 (*)    Hemoglobin 11.7 (*)    HCT 34.8 (*)    Platelets 70 (*)    All other components within normal limits  HEPATIC FUNCTION PANEL - Abnormal; Notable for the following  components:   Albumin  3.3 (*)    All other components within normal limits  TROPONIN I (HIGH SENSITIVITY) - Abnormal; Notable for the following components:   Troponin I (High Sensitivity) 49 (*)    All other components within normal limits  TROPONIN I (HIGH SENSITIVITY) - Abnormal; Notable for the following components:   Troponin I (High Sensitivity) 49 (*)    All other components within normal limits  LIPASE, BLOOD    EKG: EKG Interpretation Date/Time:  Thursday November 14 2023 07:44:57 EDT Ventricular Rate:  89 PR Interval:  158 QRS Duration:  88 QT Interval:  416 QTC Calculation: 506 R Axis:   61  Text Interpretation: Normal sinus rhythm Minimal voltage criteria for LVH, may be normal variant ( Sokolow-Lyon ) Nonspecific ST and T wave abnormality mild STE in V1-2 and AVR - similar to prior Prolonged QT Abnormal ECG When compared with ECG of 14-Nov-2023 07:36, PREVIOUS ECG IS PRESENT Confirmed by [place]ia No [id number]) on 11/14/2023 7:53:13 AM  Radiology: CT Cervical Spine Wo Contrast Result Date: 11/14/2023 EXAM: CT CERVICAL SPINE WITHOUT CONTRAST 11/14/2023 09:26:00 AM TECHNIQUE: CT of the cervical spine was performed without the administration of intravenous contrast. Multiplanar reformatted images are provided for review. Automated exposure control, iterative reconstruction, and/or weight based adjustment of the mA/kV was utilized to reduce the radiation dose to as  low as reasonably achievable. COMPARISON: Cervical spine CT 07/30/2022. CLINICAL HISTORY: 63 year old male with neck pain, right shoulder pain, and chest pain for 2 days, history of neck surgery with hardware. FINDINGS: CERVICAL SPINE: BONES AND ALIGNMENT: Stable straightening of cervical lordosis. Chronic cervical fusion C5-C6 with solid arthrodesis. Chronic C6-C7 ACDF (Anterior Cervical Discectomy and Fusion) with no evidence of hardware loosening and solid interbody arthrodesis, as seen on sagittal image 47, which has improved from last year. 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 No acute fracture or traumatic malalignment. DEGENERATIVE CHANGES: Chronic severe adjacent segment disease at C7-T1 with severe disc space loss, vacuum disc, and advanced endplate irregularity, which is stable. Severe adjacent segment facet arthropathy at C4-C5, greater on the right. Chronic severe facet arthropathy on the left at C3-C4, which is adjacent to chronic degenerative C2-C3 facet ankylosis. Advanced calcified atherosclerosis diffusely, suggesting end stage renal disease. SOFT TISSUES: No prevertebral soft tissue swelling. POSTERIOR FOSSA: Negative visible noncontrast posterior fossa. THORACIC INLET: Negative visible noncontrast thoracic inlet. IMPRESSION: 1. No acute osseous abnormality in the cervical spine. 2. C5C6 solid fusion and C6C7 ACDF with improved interbody arthrodesis since last year. Superimposed degenerative ankylosis at C2-C3. 3. Severe adjacent segment degenerative disease C1-C2, C3-C4, C4-C5, and C7T1. 4. Diffuse advanced calcified atherosclerosis, which may be seen with end-stage renal disease. Electronically signed by: Helayne Hurst MD 11/14/2023 09:35 AM EDT RP Workstation: [id number]   DG Shoulder Right Result Date: 11/14/2023 EXAM: 1 VIEW XRAY OF THE RIGHT SHOULDER 11/14/2023 09:17:00 AM COMPARISON: None available. CLINICAL HISTORY: severe pain/ no  deformity. No injury,onset of sharp right shoulder/neck pain past 2 days. FINDINGS: BONES AND JOINTS: Glenohumeral joint is normally aligned. No acute fracture or dislocation. Moderate acromioclavicular joint space narrowing, subchondral sclerosis, and peripheral osteophytosis consistent with acromioclavicular osteoarthritis. Lower cervical spine fusion hardware noted. SOFT TISSUES: 4 mm mineralization just superior to lateral acromion. Moderate aortic arch calcification. Visualized lung is unremarkable. IMPRESSION: 1. No acute osseous abnormality. 2. Moderate acromioclavicular osteoarthritis. Electronically signed by: Helayne Hurst MD 11/14/2023 09:26 AM EDT RP Workstation: [id number]   DG Chest 2 View Result Date:  11/14/2023 EXAM: 2 VIEW(S) XRAY OF THE CHEST 11/14/2023 08:12:00 AM COMPARISON: Chest radiographs 10/04/2023 and earlier. CLINICAL HISTORY: Patient is a 63 year old male. Chest pain, shoulder pain, neck pain, no injury, history of old neck surgery. FINDINGS: LUNGS AND PLEURA: Regressed symmetric pulmonary interstitial opacity since last month, now appears to be at baseline. No focal pulmonary opacity. No pleural effusion. No pneumothorax. HEART AND MEDIASTINUM: Stable chronic cardiomegaly. BONES AND SOFT TISSUES: Chronic cervical ACDF. No acute osseous abnormality. IMPRESSION: 1. Stable cardiomegaly.  No acute cardiopulmonary abnormality. Electronically signed by: Helayne Hurst MD 11/14/2023 08:21 AM EDT RP Workstation: [id number]     Procedures   Medications Ordered in the ED  fentaNYL  (SUBLIMAZE ) injection 50 mcg (50 mcg Intravenous Given 11/14/23 0946)  methocarbamol  (ROBAXIN ) tablet 500 mg (500 mg Oral Given 11/14/23 1003)    Clinical Course as of 11/14/23 1056  Thu Nov 14, 2023  0934 Shoulder x-ray unremarkable [AL]    Clinical Course User Index [AL] Zana Biancardi, DO                                 Medical Decision Making 63 year old male presenting to the emergency department  for right shoulder pain with radiation into the neck and chest. Differential includes but not limited to CAD, pneumothorax, trauma, skill skeletal, and others. Patient did have noticeable pain when moving his right upper extremity and his pain seems most severe/reproduced with movement.  This points less to any cardiovascular cause of his chest pain. EKG showed nonspecific ST segment elevation in V1-V2 with elevation in aVL, however this is unchanged from his priors.  He did just have a catheterization performed last month that showed mild CAD and this is reassuring that he is not suffering from any acute myocardial infarction at this time. Liver function and lipase added onto blood work to evaluate for any evidence of biliary colic or cholecystitis.He has an extensive past medical history and this does include chronic shoulder pain bilaterally.  He is also currently being worked up for his aortic stenosis and plans on following with CT surgery for intervention.  Liver function and lipase were within normal limits. Troponin is elevated at his typical baseline.  BNP shows a creatinine at baseline.  Glucose also elevated at 192.  Hemoglobin within normal limits.  Chest x-ray did not show evidence of pneumothorax or focal consolidation.  We did add on an x-ray of the right shoulder and CT cervical spine of the neck to evaluate for any abnormalities that could explain his discomfort.  Shoulder x-ray was unremarkable and CT of the cervical spine shows no acute bony abnormality.  It does show a prior fusion with superimposed degenerative ankylosis and severe segment degenerative disc disease.  Low likelihood for neurovascular concern given the pain with movement, normal sensation, normal distal pulses, and normal perfusion. Plan to place the patient in a sling to limit his movement.  Repeat troponin was unchanged.  Patient stable for discharge at this time.  We gave orthopedic follow-up and strict return  precautions.  Amount and/or Complexity of Data Reviewed Labs: ordered. Radiology: ordered.  Risk Prescription drug management.     Final diagnoses:  Right shoulder pain, unspecified chronicity    ED Discharge Orders     None          Kaiyden Simkin, DO 11/14/23 1056

## 2023-11-14 NOTE — ED Notes (Signed)
 Requested ortho tech to apply sling to right arm

## 2023-11-15 ENCOUNTER — Emergency Department (HOSPITAL_COMMUNITY)

## 2023-11-15 ENCOUNTER — Other Ambulatory Visit: Payer: Self-pay

## 2023-11-15 ENCOUNTER — Emergency Department (HOSPITAL_COMMUNITY)
Admission: EM | Admit: 2023-11-15 | Discharge: 2023-11-15 | Disposition: A | Discharge status: Home / Self Care | Attending: Emergency Medicine | Admitting: Emergency Medicine

## 2023-11-15 ENCOUNTER — Encounter (HOSPITAL_COMMUNITY): Payer: Self-pay

## 2023-11-15 DIAGNOSIS — M542 Cervicalgia: Secondary | ICD-10-CM | POA: Diagnosis not present

## 2023-11-15 DIAGNOSIS — M25519 Pain in unspecified shoulder: Secondary | ICD-10-CM | POA: Diagnosis not present

## 2023-11-15 DIAGNOSIS — R079 Chest pain, unspecified: Secondary | ICD-10-CM

## 2023-11-15 DIAGNOSIS — N2581 Secondary hyperparathyroidism of renal origin: Secondary | ICD-10-CM | POA: Diagnosis not present

## 2023-11-15 DIAGNOSIS — Z79899 Other long term (current) drug therapy: Secondary | ICD-10-CM | POA: Insufficient documentation

## 2023-11-15 DIAGNOSIS — I132 Hypertensive heart and chronic kidney disease with heart failure and with stage 5 chronic kidney disease, or end stage renal disease: Secondary | ICD-10-CM | POA: Diagnosis not present

## 2023-11-15 DIAGNOSIS — I471 Supraventricular tachycardia, unspecified: Secondary | ICD-10-CM | POA: Insufficient documentation

## 2023-11-15 DIAGNOSIS — R0789 Other chest pain: Secondary | ICD-10-CM | POA: Insufficient documentation

## 2023-11-15 DIAGNOSIS — Z7982 Long term (current) use of aspirin: Secondary | ICD-10-CM | POA: Diagnosis not present

## 2023-11-15 DIAGNOSIS — I509 Heart failure, unspecified: Secondary | ICD-10-CM | POA: Diagnosis not present

## 2023-11-15 DIAGNOSIS — R059 Cough, unspecified: Secondary | ICD-10-CM | POA: Diagnosis not present

## 2023-11-15 DIAGNOSIS — D631 Anemia in chronic kidney disease: Secondary | ICD-10-CM | POA: Diagnosis not present

## 2023-11-15 DIAGNOSIS — D509 Iron deficiency anemia, unspecified: Secondary | ICD-10-CM | POA: Diagnosis not present

## 2023-11-15 DIAGNOSIS — R000 Tachycardia, unspecified: Secondary | ICD-10-CM | POA: Diagnosis not present

## 2023-11-15 DIAGNOSIS — N186 End stage renal disease: Secondary | ICD-10-CM | POA: Diagnosis not present

## 2023-11-15 DIAGNOSIS — F32A Depression, unspecified: Secondary | ICD-10-CM | POA: Diagnosis not present

## 2023-11-15 DIAGNOSIS — Z992 Dependence on renal dialysis: Secondary | ICD-10-CM | POA: Diagnosis not present

## 2023-11-15 DIAGNOSIS — M25511 Pain in right shoulder: Secondary | ICD-10-CM | POA: Insufficient documentation

## 2023-11-15 LAB — BASIC METABOLIC PANEL WITH GFR
Anion gap: 18 — ABNORMAL HIGH (ref 5–15)
BUN: 19 mg/dL (ref 8–23)
CO2: 28 mmol/L (ref 22–32)
Calcium: 8.9 mg/dL (ref 8.9–10.3)
Chloride: 87 mmol/L — ABNORMAL LOW (ref 98–111)
Creatinine, Ser: 5.59 mg/dL — ABNORMAL HIGH (ref 0.61–1.24)
GFR, Estimated: 11 mL/min — ABNORMAL LOW (ref >60)
Glucose, Bld: 116 mg/dL — ABNORMAL HIGH (ref 70–99)
Potassium: 3.9 mmol/L (ref 3.5–5.1)
Sodium: 133 mmol/L — ABNORMAL LOW (ref 135–145)

## 2023-11-15 LAB — CBC WITH DIFFERENTIAL/PLATELET
Abs Immature Granulocytes: 0.03 K/uL (ref 0.00–0.07)
Basophils Absolute: 0 K/uL (ref 0.0–0.1)
Basophils Relative: 0 %
Eosinophils Absolute: 0 K/uL (ref 0.0–0.5)
Eosinophils Relative: 0 %
HCT: 34.2 % — ABNORMAL LOW (ref 39.0–52.0)
Hemoglobin: 11.6 g/dL — ABNORMAL LOW (ref 13.0–17.0)
Immature Granulocytes: 0 %
Lymphocytes Relative: 4 %
Lymphs Abs: 0.4 K/uL — ABNORMAL LOW (ref 0.7–4.0)
MCH: 32.6 pg (ref 26.0–34.0)
MCHC: 33.9 g/dL (ref 30.0–36.0)
MCV: 96.1 fL (ref 80.0–100.0)
Monocytes Absolute: 1 K/uL (ref 0.1–1.0)
Monocytes Relative: 12 %
Neutro Abs: 6.9 K/uL (ref 1.7–7.7)
Neutrophils Relative %: 84 %
Platelets: 105 K/uL — ABNORMAL LOW (ref 150–400)
RBC: 3.56 MIL/uL — ABNORMAL LOW (ref 4.22–5.81)
RDW: 15.1 % (ref 11.5–15.5)
WBC: 8.3 K/uL (ref 4.0–10.5)
nRBC: 0 % (ref 0.0–0.2)

## 2023-11-15 LAB — HEPATIC FUNCTION PANEL
ALT: 16 U/L (ref 0–44)
AST: 18 U/L (ref 15–41)
Albumin: 2.8 g/dL — ABNORMAL LOW (ref 3.5–5.0)
Alkaline Phosphatase: 70 U/L (ref 38–126)
Bilirubin, Direct: 0.4 mg/dL — ABNORMAL HIGH (ref 0.0–0.2)
Indirect Bilirubin: 1.1 mg/dL — ABNORMAL HIGH (ref 0.3–0.9)
Total Bilirubin: 1.5 mg/dL — ABNORMAL HIGH (ref 0.0–1.2)
Total Protein: 7.1 g/dL (ref 6.5–8.1)

## 2023-11-15 LAB — CBG MONITORING, ED: Glucose-Capillary: 116 mg/dL — ABNORMAL HIGH (ref 70–99)

## 2023-11-15 LAB — TROPONIN I (HIGH SENSITIVITY)
Troponin I (High Sensitivity): 45 ng/L — ABNORMAL HIGH (ref <18)
Troponin I (High Sensitivity): 49 ng/L — ABNORMAL HIGH (ref <18)

## 2023-11-15 LAB — RESP PANEL BY RT-PCR (RSV, FLU A&B, COVID)  RVPGX2
Influenza A by PCR: NEGATIVE
Influenza B by PCR: NEGATIVE
Resp Syncytial Virus by PCR: NEGATIVE
SARS Coronavirus 2 by RT PCR: NEGATIVE

## 2023-11-15 MED ORDER — OXYCODONE HCL 5 MG PO TABS
5.0000 mg | ORAL_TABLET | Freq: Once | ORAL | Status: AC
  Administered 2023-11-15: 5 mg via ORAL
  Filled 2023-11-15: qty 1

## 2023-11-15 MED ORDER — FENTANYL CITRATE (PF) 50 MCG/ML IJ SOSY
50.0000 ug | PREFILLED_SYRINGE | Freq: Once | INTRAMUSCULAR | Status: AC
  Administered 2023-11-15: 50 ug via INTRAVENOUS
  Filled 2023-11-15: qty 1

## 2023-11-15 MED ORDER — OXYCODONE HCL 5 MG PO TABS: 5.0000 mg | ORAL_TABLET | ORAL | 0 refills | Status: AC | PRN

## 2023-11-15 MED ORDER — DILTIAZEM HCL 25 MG/5ML IV SOLN
20.0000 mg | Freq: Once | INTRAVENOUS | Status: AC
  Administered 2023-11-15: 20 mg via INTRAVENOUS
  Filled 2023-11-15: qty 5

## 2023-11-15 NOTE — ED Triage Notes (Signed)
 Pt BIB [facility] from dialysis for chest pain and nausea. Pt received most of his dialysis treatment, EMS noted depression in posterior leads with no reciprocal changes, similar to previous EKG history. 2 nitroglycerin , 324 ASA given by EMS. Last BP 172/84. All other VSS.

## 2023-11-15 NOTE — Discharge Instructions (Addendum)
 There is no evidence of heart attack today.  You will need to follow-up with cardiology due to your brief episode of high heart rate called SVT.  We are giving you a prescription of oxycodone  for severe/breakthrough pain.  You may otherwise continue taking Tylenol  for pain.  Do not drive or operate heavy machinery or combine with other meds or alcohol.  If you develop new or worsening chest pain, shortness of breath, or any other new/concerning symptoms then return to the ER.

## 2023-11-15 NOTE — ED Notes (Addendum)
 Pt in x-ray department; telemetry monitoring shows HR in 150s-160s. Xray department called, pt appears stable att per x-ray, EDP notified and will assess when pt returns to dept.

## 2023-11-15 NOTE — ED Provider Notes (Addendum)
 [place] EMERGENCY DEPARTMENT AT [facility] Provider Note   CSN: [id number] Arrival date & time: 11/15/23  1538     Patient presents with: Chest Pain   Kirk [name] is a 63 y.o. male.   HPI 63 year old male with a history of ESRD on dialysis, chronic respiratory failure on 3 L, hypertension, OSA, CHF, and multiple other comorbidities presents with chest pain.  He has been having chest pain for about 3 days.  Today in particular has been constant.  He will get lightning bolts of pain in his anterior chest.  Feels very sharp.  Nothing he does makes it better or worse.  He has also been dealing with pain to his right trapezius/neck and shoulder.  No trauma.  Pain radiates upward.  He denies any abdominal pain or current back pain.  He has been having a cough for the last 24 hours though no fever or shortness of breath.  He came from dialysis due to the pain.  Prior to Admission medications   Medication Sig Start Date End Date Taking? Authorizing Provider  oxyCODONE  (ROXICODONE ) 5 MG immediate release tablet Take 1 tablet (5 mg total) by mouth every 4 (four) hours as needed for severe pain (pain score 7-10). 11/15/23  Yes Freddi Hamilton, MD  acetaminophen  (TYLENOL ) 500 MG tablet Take 1 tablet (500 mg total) by mouth every 6 (six) hours as needed for moderate pain. Patient taking differently: Take 1,000 mg by mouth every 6 (six) hours as needed for moderate pain (pain score 4-6) or headache. 09/12/14   Jadine Toribio SQUIBB, MD  albuterol  (PROVENTIL ) (2.5 MG/3ML) 0.083% nebulizer solution Take 3 mLs (2.5 mg total) by nebulization every 4 (four) hours as needed for wheezing or shortness of breath. 06/25/23   Elgergawy, Brayton RAMAN, MD  albuterol  (VENTOLIN  HFA) 108 (90 Base) MCG/ACT inhaler Inhale 2 puffs into the lungs every 6 (six) hours as needed for wheezing or shortness of breath. 10/29/22   Darlean Ozell KATHEE, MD  aspirin  EC 81 MG tablet Take 81 mg by mouth daily in the afternoon.     [provider]  Cholecalciferol  (VITAMIN D3) 25 MCG (1000 UT) CAPS Take 4,000 Units by mouth See admin instructions. Patient takes Earlean Everts, Sat, Sun    [provider]  doxazosin  (CARDURA ) 4 MG tablet Take 1 tablet (4 mg total) by mouth at bedtime. 05/31/23   Walker, Caitlin S, NP  fluticasone -salmeterol (ADVAIR HFA) 45-21 MCG/ACT inhaler  07/05/23   Darlean Ozell KATHEE, MD  guaiFENesin  (MUCINEX ) 600 MG 12 hr tablet Take 1,200 mg by mouth 2 (two) times daily as needed for cough or to loosen phlegm.    [provider]  hydrALAZINE  (APRESOLINE ) 50 MG tablet Take 1 tablet (50 mg total) by mouth 3 (three) times daily. 10/16/23 01/14/24  Meng, Hao, PA  ipratropium-albuterol  (DUONEB) 0.5-2.5 (3) MG/3ML SOLN Take 3 mLs by nebulization every 6 (six) hours. Please take every 6 hours scheduled for next 5 days then as needed. 06/25/23   Elgergawy, Brayton RAMAN, MD  isosorbide  mononitrate (IMDUR ) 60 MG 24 hr tablet Take 60 mg by mouth daily. 10/04/22   [provider]  metoprolol  succinate (TOPROL -XL) 25 MG 24 hr tablet Take 1 tablet (25 mg total) by mouth daily. 10/07/23 11/06/23  Pahwani, Ravi, MD  multivitamin (RENA-VIT) TABS tablet Take 1 tablet by mouth daily. Patient taking differently: Take 1 tablet by mouth See admin instructions. Take 1 tablet by mouth once daily in the afternoon. 01/29/23  Ngetich, Dinah C, NP  OXYGEN  Inhale into the lungs. 2 L  cont.    [provider]  pravastatin  (PRAVACHOL ) 40 MG tablet TAKE 1 TABLET BY MOUTH EVERY DAY IN THE EVENING Patient taking differently: Take 40 mg by mouth at bedtime. TAKE 1 TABLET BY MOUTH EVERY DAY IN THE EVENING 12/04/22   Ngetich, Dinah C, NP  sevelamer  carbonate (RENVELA ) 2.4 g PACK Take 4.8 g by mouth 3 (three) times daily with meals. Also takes with his snacks    [provider]  sucroferric oxyhydroxide (VELPHORO ) 500 MG chewable tablet Chew 1,000-1,500 mg by mouth See admin instructions. Take 3 tablets  (1500mg ) by mouth three times daily with meals and take 2 tablets (1000mg ) with snacks.    [provider]  traZODone  (DESYREL ) 50 MG tablet TAKE 0.5-1 TABLET (25-50 MG TOTAL) BY MOUTH AT BEDTIME AS NEEDED. FOR SLEEP Patient taking differently: Take 50 mg by mouth at bedtime. 04/08/23   Ngetich, Dinah C, NP  UNABLE TO FIND Inhale 1 Device into the lungs at bedtime. CPAP    [provider]    Allergies: Codeine and Hydrocodone -acetaminophen     Review of Systems  Constitutional:  Negative for fever.  Respiratory:  Positive for cough. Negative for shortness of breath.   Cardiovascular:  Positive for chest pain.  Gastrointestinal:  Negative for abdominal pain.  Musculoskeletal:  Positive for neck pain.  Neurological:  Negative for weakness and numbness.    Updated Vital Signs BP (!) 152/75   Pulse 93   Temp 98.6 F (37 C) (Oral)   Resp (!) 26   Ht 5' 7 (1.702 m)   Wt 84 kg   SpO2 100%   BMI 29.00 kg/m   Physical Exam Vitals and nursing note reviewed.  Constitutional:      Appearance: He is well-developed.  HENT:     Head: Normocephalic and atraumatic.  Cardiovascular:     Rate and Rhythm: Normal rate and regular rhythm.     Pulses:          Radial pulses are 2+ on the right side.     Heart sounds: Murmur heard.  Pulmonary:     Effort: Pulmonary effort is normal.     Breath sounds: Normal breath sounds.  Chest:     Chest wall: Tenderness present.    Abdominal:     Palpations: Abdomen is soft.     Tenderness: There is no abdominal tenderness.  Musculoskeletal:     Right shoulder: Tenderness present. Decreased range of motion.     Comments: Tenderness along the right trapezius. Limited ROM due to pain of the right shoulder. No deformity. Intact strength in right hand.  Skin:    General: Skin is warm and dry.  Neurological:     Mental Status: He is alert.     (all labs ordered are listed, but only abnormal results are displayed) Labs Reviewed   BASIC METABOLIC PANEL WITH GFR - Abnormal; Notable for the following components:      Result Value   Sodium 133 (*)    Chloride 87 (*)    Glucose, Bld 116 (*)    Creatinine, Ser 5.59 (*)    GFR, Estimated 11 (*)    Anion gap 18 (*)    All other components within normal limits  CBC WITH DIFFERENTIAL/PLATELET - Abnormal; Notable for the following components:   RBC 3.56 (*)    Hemoglobin 11.6 (*)    HCT 34.2 (*)  Platelets 105 (*)    Lymphs Abs 0.4 (*)    All other components within normal limits  HEPATIC FUNCTION PANEL - Abnormal; Notable for the following components:   Albumin  2.8 (*)    Total Bilirubin 1.5 (*)    Bilirubin, Direct 0.4 (*)    Indirect Bilirubin 1.1 (*)    All other components within normal limits  CBG MONITORING, ED - Abnormal; Notable for the following components:   Glucose-Capillary 116 (*)    All other components within normal limits  TROPONIN I (HIGH SENSITIVITY) - Abnormal; Notable for the following components:   Troponin I (High Sensitivity) 45 (*)    All other components within normal limits  TROPONIN I (HIGH SENSITIVITY) - Abnormal; Notable for the following components:   Troponin I (High Sensitivity) 49 (*)    All other components within normal limits  RESP PANEL BY RT-PCR (RSV, FLU A&B, COVID)  [id number]    EKG: EKG Interpretation Date/Time:  Friday November 15 2023 15:50:04 EDT Ventricular Rate:  102 PR Interval:  157 QRS Duration:  88 QT Interval:  361 QTC Calculation: 471 R Axis:   71  Text Interpretation: Sinus tachycardia Probable LVH with secondary repol abnrm ST depression in Lateral leads Confirmed by [place]ia No [phone]) on 11/15/2023 3:59:58 PM  Radiology: [facility] Chest 2 View Result Date: 11/15/2023 CLINICAL DATA:  Pain in the upper back, neck and shoulder with tachycardia during imaging. EXAM: CHEST - 2 VIEW COMPARISON:  November 14, 2023 FINDINGS: The cardiac silhouette is mildly enlarged and unchanged in size. Moderate severity  calcification of the aortic arch is noted. No acute infiltrate, pleural effusion or pneumothorax is identified. Postoperative changes are seen within the lower cervical spine. The visualized skeletal structures are unremarkable. IMPRESSION: No active cardiopulmonary disease. Electronically Signed   By: Suzen Dials M.D.   On: 11/15/2023 17:57   CT Cervical Spine Wo Contrast Result Date: 11/14/2023 EXAM: CT CERVICAL SPINE WITHOUT CONTRAST 11/14/2023 09:26:00 AM TECHNIQUE: CT of the cervical spine was performed without the administration of intravenous contrast. Multiplanar reformatted images are provided for review. Automated exposure control, iterative reconstruction, and/or weight based adjustment of the mA/kV was utilized to reduce the radiation dose to as low as reasonably achievable. COMPARISON: Cervical spine CT 07/30/2022. CLINICAL HISTORY: 63 year old male with neck pain, right shoulder pain, and chest pain for 2 days, history of neck surgery with hardware. FINDINGS: CERVICAL SPINE: BONES AND ALIGNMENT: Stable straightening of cervical lordosis. Chronic cervical fusion C5-C6 with solid arthrodesis. Chronic C6-C7 ACDF (Anterior Cervical Discectomy and Fusion) with no evidence of hardware loosening and solid interbody arthrodesis, as seen on sagittal image 47, which has improved from last year. 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 No acute fracture or traumatic malalignment. DEGENERATIVE CHANGES: Chronic severe adjacent segment disease at C7-T1 with severe disc space loss, vacuum disc, and advanced endplate irregularity, which is stable. Severe adjacent segment facet arthropathy at C4-C5, greater on the right. Chronic severe facet arthropathy on the left at C3-C4, which is adjacent to chronic degenerative C2-C3 facet ankylosis. Advanced calcified atherosclerosis diffusely, suggesting end stage renal disease. SOFT TISSUES: No prevertebral soft tissue  swelling. POSTERIOR FOSSA: Negative visible noncontrast posterior fossa. THORACIC INLET: Negative visible noncontrast thoracic inlet. IMPRESSION: 1. No acute osseous abnormality in the cervical spine. 2. C5C6 solid fusion and C6C7 ACDF with improved interbody arthrodesis since last year. Superimposed degenerative ankylosis at C2-C3. 3. Severe adjacent segment degenerative disease C1-C2, C3-C4, C4-C5, and C7T1.  4. Diffuse advanced calcified atherosclerosis, which may be seen with end-stage renal disease. Electronically signed by: Helayne Hurst MD 11/14/2023 09:35 AM EDT RP Workstation: [id number]   DG Shoulder Right Result Date: 11/14/2023 EXAM: 1 VIEW XRAY OF THE RIGHT SHOULDER 11/14/2023 09:17:00 AM COMPARISON: None available. CLINICAL HISTORY: severe pain/ no deformity. No injury,onset of sharp right shoulder/neck pain past 2 days. FINDINGS: BONES AND JOINTS: Glenohumeral joint is normally aligned. No acute fracture or dislocation. Moderate acromioclavicular joint space narrowing, subchondral sclerosis, and peripheral osteophytosis consistent with acromioclavicular osteoarthritis. Lower cervical spine fusion hardware noted. SOFT TISSUES: 4 mm mineralization just superior to lateral acromion. Moderate aortic arch calcification. Visualized lung is unremarkable. IMPRESSION: 1. No acute osseous abnormality. 2. Moderate acromioclavicular osteoarthritis. Electronically signed by: Helayne Hurst MD 11/14/2023 09:26 AM EDT RP Workstation: [id number]   DG Chest 2 View Result Date: 11/14/2023 EXAM: 2 VIEW(S) XRAY OF THE CHEST 11/14/2023 08:12:00 AM COMPARISON: Chest radiographs 10/04/2023 and earlier. CLINICAL HISTORY: Patient is a 63 year old male. Chest pain, shoulder pain, neck pain, no injury, history of old neck surgery. FINDINGS: LUNGS AND PLEURA: Regressed symmetric pulmonary interstitial opacity since last month, now appears to be at baseline. No focal pulmonary opacity. No pleural effusion. No pneumothorax.  HEART AND MEDIASTINUM: Stable chronic cardiomegaly. BONES AND SOFT TISSUES: Chronic cervical ACDF. No acute osseous abnormality. IMPRESSION: 1. Stable cardiomegaly.  No acute cardiopulmonary abnormality. Electronically signed by: Helayne Hurst MD 11/14/2023 08:21 AM EDT RP Workstation: [id number]     .Critical Care  Performed by: Freddi Hamilton, MD Authorized by: Freddi Hamilton, MD   Critical care provider statement:    Critical care time (minutes):  30   Critical care time was exclusive of:  Separately billable procedures and treating other patients   Critical care was necessary to treat or prevent imminent or life-threatening deterioration of the following conditions:  Circulatory failure and cardiac failure   Critical care was time spent personally by me on the following activities:  Development of treatment plan with patient or surrogate, discussions with consultants, evaluation of patient's response to treatment, examination of patient, ordering and review of laboratory studies, ordering and review of radiographic studies, ordering and performing treatments and interventions, pulse oximetry, re-evaluation of patient's condition and review of old charts    Medications Ordered in the ED  fentaNYL  (SUBLIMAZE ) injection 50 mcg (50 mcg Intravenous Given 11/15/23 1725)  diltiazem  (CARDIZEM ) injection 20 mg (20 mg Intravenous Given 11/15/23 1736)  oxyCODONE  (Oxy IR/ROXICODONE ) immediate release tablet 5 mg (5 mg Oral Given 11/15/23 2036)                                    Medical Decision Making Amount and/or Complexity of Data Reviewed Labs: ordered.    Details: Troponin is minimally elevated but flat.  Stable anemia Radiology: ordered and independent interpretation performed.    Details: No pneumonia ECG/medicine tests: ordered and independent interpretation performed.    Details: No change from baseline initially, then SVT, then back to sinus  Risk Prescription drug  management.   Patient presents with chest pain.  Seems to be more related to his chest wall/trapezius.  Given some IV pain meds during workup.  During workup did go into SVT, was given some diltiazem  to break it.  No further SVT after this on cardiac monitoring review.  He reports he has had palpitations in the past, does not have a clear diagnosis but I  wonder if he has had SVT in the past.  Troponins are flat and mildly elevated likely due to his dialysis status.  However he had an unremarkable cath not too long ago and I highly doubt he has ACS today.  I doubt PE or dissection based on presentation.  Will give some pain control but I think he is otherwise stable for discharge to follow-up outpatient with cardiology for both the chest pain (which is likely chest wall) as well as the SVT.  Will discharge with return precautions.     Final diagnoses:  Nonspecific chest pain  SVT (supraventricular tachycardia)    ED Discharge Orders          Ordered    oxyCODONE  (ROXICODONE ) 5 MG immediate release tablet  Every 4 hours PRN        11/15/23 2020    Ambulatory referral to Cardiology       Comments: If you have not heard from the Cardiology office within the next 72 hours please call [phone].   11/15/23 2022               Freddi Hamilton, MD 11/15/23 2317    Freddi Hamilton, MD 11/16/23 0003

## 2023-11-18 DIAGNOSIS — Z992 Dependence on renal dialysis: Secondary | ICD-10-CM | POA: Diagnosis not present

## 2023-11-18 DIAGNOSIS — N186 End stage renal disease: Secondary | ICD-10-CM | POA: Diagnosis not present

## 2023-11-18 DIAGNOSIS — D631 Anemia in chronic kidney disease: Secondary | ICD-10-CM | POA: Diagnosis not present

## 2023-11-18 DIAGNOSIS — D509 Iron deficiency anemia, unspecified: Secondary | ICD-10-CM | POA: Diagnosis not present

## 2023-11-18 DIAGNOSIS — N2581 Secondary hyperparathyroidism of renal origin: Secondary | ICD-10-CM | POA: Diagnosis not present

## 2023-11-19 ENCOUNTER — Ambulatory Visit (HOSPITAL_COMMUNITY)
Admission: RE | Admit: 2023-11-19 | Discharge: 2023-11-19 | Disposition: A | Discharge status: Home / Self Care | Source: Ambulatory Visit | Attending: Cardiology | Admitting: Cardiology

## 2023-11-19 DIAGNOSIS — Z01818 Encounter for other preprocedural examination: Secondary | ICD-10-CM | POA: Diagnosis not present

## 2023-11-19 DIAGNOSIS — I350 Nonrheumatic aortic (valve) stenosis: Secondary | ICD-10-CM | POA: Insufficient documentation

## 2023-11-19 DIAGNOSIS — Z48812 Encounter for surgical aftercare following surgery on the circulatory system: Secondary | ICD-10-CM | POA: Diagnosis not present

## 2023-11-19 DIAGNOSIS — I517 Cardiomegaly: Secondary | ICD-10-CM | POA: Diagnosis not present

## 2023-11-19 DIAGNOSIS — I700 Atherosclerosis of aorta: Secondary | ICD-10-CM | POA: Diagnosis not present

## 2023-11-19 DIAGNOSIS — N281 Cyst of kidney, acquired: Secondary | ICD-10-CM | POA: Diagnosis not present

## 2023-11-19 MED ORDER — IOHEXOL 350 MG/ML SOLN
100.0000 mL | Freq: Once | INTRAVENOUS | Status: AC | PRN
  Administered 2023-11-19: 100 mL via INTRAVENOUS

## 2023-11-19 NOTE — Progress Notes (Signed)
 Procedure Type: Isolated AVR Perioperative Outcome Estimate % Operative Mortality 5.33% Morbidity & Mortality 17.5% Stroke 1.81% Renal Failure NA Reoperation 6.04% Prolonged Ventilation 11% Deep Sternal Wound Infection 0.098% Long Hospital Stay (>14 days) 9.4% Short Hospital Stay (<6 days)* 27.1%

## 2023-11-20 ENCOUNTER — Encounter: Payer: Self-pay | Admitting: Internal Medicine

## 2023-11-20 ENCOUNTER — Ambulatory Visit: Payer: Self-pay | Admitting: Internal Medicine

## 2023-11-20 ENCOUNTER — Ambulatory Visit: Attending: Cardiology | Admitting: Internal Medicine

## 2023-11-20 VITALS — BP 126/66 | HR 87 | Ht 67.0 in | Wt 186.1 lb

## 2023-11-20 DIAGNOSIS — D509 Iron deficiency anemia, unspecified: Secondary | ICD-10-CM | POA: Diagnosis not present

## 2023-11-20 DIAGNOSIS — N186 End stage renal disease: Secondary | ICD-10-CM | POA: Insufficient documentation

## 2023-11-20 DIAGNOSIS — I5042 Chronic combined systolic (congestive) and diastolic (congestive) heart failure: Secondary | ICD-10-CM | POA: Diagnosis not present

## 2023-11-20 DIAGNOSIS — N2581 Secondary hyperparathyroidism of renal origin: Secondary | ICD-10-CM | POA: Diagnosis not present

## 2023-11-20 DIAGNOSIS — D631 Anemia in chronic kidney disease: Secondary | ICD-10-CM | POA: Diagnosis not present

## 2023-11-20 DIAGNOSIS — I10 Essential (primary) hypertension: Secondary | ICD-10-CM | POA: Insufficient documentation

## 2023-11-20 DIAGNOSIS — I350 Nonrheumatic aortic (valve) stenosis: Secondary | ICD-10-CM | POA: Diagnosis not present

## 2023-11-20 DIAGNOSIS — Z992 Dependence on renal dialysis: Secondary | ICD-10-CM | POA: Diagnosis not present

## 2023-11-20 NOTE — Patient Instructions (Signed)
 Medication Instructions:  NO CHANGES  *If you need a refill on your cardiac medications before your next appointment, please call your pharmacy*  Follow-Up: At [facility], you and your health needs are our priority.  As part of our continuing mission to provide you with exceptional heart care, our providers are all part of one team.  This team includes your primary Cardiologist (physician) and Advanced Practice Providers or APPs (Physician Assistants and Nurse Practitioners) who all work together to provide you with the care you need, when you need it.  Your next appointment:    6 months with Dr.Hilty  CALL in December for an April appointment  We recommend signing up for the patient portal called MyChart.  Sign up information is provided on this After Visit Summary.  MyChart is used to connect with patients for Virtual Visits (Telemedicine).  Patients are able to view lab/test results, encounter notes, upcoming appointments, etc.  Non-urgent messages can be sent to your provider as well.   To learn more about what you can do with MyChart, go to [email or web address].   Other Instructions

## 2023-11-20 NOTE — Progress Notes (Signed)
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 KYM Mona, MD, [place], FNLA, FACP  [facility]  Medical Director of the [facility]  Cardiovascular Risk Reduction Clinic Diplomate of the American Board of Clinical Lipidology Attending Cardiologist  Direct Dial : [phone]  Fax: [phone]  Website:  [email or web address]  Vinie [name] Shaley Leavens 11/20/2023, 9:50 AM

## 2023-11-22 DIAGNOSIS — N186 End stage renal disease: Secondary | ICD-10-CM | POA: Diagnosis not present

## 2023-11-22 DIAGNOSIS — D631 Anemia in chronic kidney disease: Secondary | ICD-10-CM | POA: Diagnosis not present

## 2023-11-22 DIAGNOSIS — Z992 Dependence on renal dialysis: Secondary | ICD-10-CM | POA: Diagnosis not present

## 2023-11-22 DIAGNOSIS — D509 Iron deficiency anemia, unspecified: Secondary | ICD-10-CM | POA: Diagnosis not present

## 2023-11-22 DIAGNOSIS — N2581 Secondary hyperparathyroidism of renal origin: Secondary | ICD-10-CM | POA: Diagnosis not present

## 2023-11-23 DIAGNOSIS — T8612 Kidney transplant failure: Secondary | ICD-10-CM | POA: Diagnosis not present

## 2023-11-23 DIAGNOSIS — N186 End stage renal disease: Secondary | ICD-10-CM | POA: Diagnosis not present

## 2023-11-23 DIAGNOSIS — Z992 Dependence on renal dialysis: Secondary | ICD-10-CM | POA: Diagnosis not present

## 2023-11-25 DIAGNOSIS — N186 End stage renal disease: Secondary | ICD-10-CM | POA: Diagnosis not present

## 2023-11-25 DIAGNOSIS — R7881 Bacteremia: Secondary | ICD-10-CM | POA: Diagnosis not present

## 2023-11-25 DIAGNOSIS — D631 Anemia in chronic kidney disease: Secondary | ICD-10-CM | POA: Diagnosis not present

## 2023-11-25 DIAGNOSIS — D509 Iron deficiency anemia, unspecified: Secondary | ICD-10-CM | POA: Diagnosis not present

## 2023-11-25 DIAGNOSIS — Z992 Dependence on renal dialysis: Secondary | ICD-10-CM | POA: Diagnosis not present

## 2023-11-25 DIAGNOSIS — N2581 Secondary hyperparathyroidism of renal origin: Secondary | ICD-10-CM | POA: Diagnosis not present

## 2023-11-27 DIAGNOSIS — R7881 Bacteremia: Secondary | ICD-10-CM | POA: Diagnosis not present

## 2023-11-27 DIAGNOSIS — D631 Anemia in chronic kidney disease: Secondary | ICD-10-CM | POA: Diagnosis not present

## 2023-11-27 DIAGNOSIS — Z992 Dependence on renal dialysis: Secondary | ICD-10-CM | POA: Diagnosis not present

## 2023-11-27 DIAGNOSIS — D509 Iron deficiency anemia, unspecified: Secondary | ICD-10-CM | POA: Diagnosis not present

## 2023-11-27 DIAGNOSIS — N186 End stage renal disease: Secondary | ICD-10-CM | POA: Diagnosis not present

## 2023-11-27 DIAGNOSIS — N2581 Secondary hyperparathyroidism of renal origin: Secondary | ICD-10-CM | POA: Diagnosis not present

## 2023-11-29 DIAGNOSIS — Z992 Dependence on renal dialysis: Secondary | ICD-10-CM | POA: Diagnosis not present

## 2023-11-29 DIAGNOSIS — N186 End stage renal disease: Secondary | ICD-10-CM | POA: Diagnosis not present

## 2023-11-29 DIAGNOSIS — D509 Iron deficiency anemia, unspecified: Secondary | ICD-10-CM | POA: Diagnosis not present

## 2023-11-29 DIAGNOSIS — R7881 Bacteremia: Secondary | ICD-10-CM | POA: Diagnosis not present

## 2023-11-29 DIAGNOSIS — N2581 Secondary hyperparathyroidism of renal origin: Secondary | ICD-10-CM | POA: Diagnosis not present

## 2023-11-29 DIAGNOSIS — D631 Anemia in chronic kidney disease: Secondary | ICD-10-CM | POA: Diagnosis not present

## 2023-12-02 DIAGNOSIS — Z992 Dependence on renal dialysis: Secondary | ICD-10-CM | POA: Diagnosis not present

## 2023-12-02 DIAGNOSIS — D631 Anemia in chronic kidney disease: Secondary | ICD-10-CM | POA: Diagnosis not present

## 2023-12-02 DIAGNOSIS — N2581 Secondary hyperparathyroidism of renal origin: Secondary | ICD-10-CM | POA: Diagnosis not present

## 2023-12-02 DIAGNOSIS — N186 End stage renal disease: Secondary | ICD-10-CM | POA: Diagnosis not present

## 2023-12-02 DIAGNOSIS — R7881 Bacteremia: Secondary | ICD-10-CM | POA: Diagnosis not present

## 2023-12-02 DIAGNOSIS — D509 Iron deficiency anemia, unspecified: Secondary | ICD-10-CM | POA: Diagnosis not present

## 2023-12-04 DIAGNOSIS — Z992 Dependence on renal dialysis: Secondary | ICD-10-CM | POA: Diagnosis not present

## 2023-12-04 DIAGNOSIS — D631 Anemia in chronic kidney disease: Secondary | ICD-10-CM | POA: Diagnosis not present

## 2023-12-04 DIAGNOSIS — D509 Iron deficiency anemia, unspecified: Secondary | ICD-10-CM | POA: Diagnosis not present

## 2023-12-04 DIAGNOSIS — N2581 Secondary hyperparathyroidism of renal origin: Secondary | ICD-10-CM | POA: Diagnosis not present

## 2023-12-04 DIAGNOSIS — N186 End stage renal disease: Secondary | ICD-10-CM | POA: Diagnosis not present

## 2023-12-04 DIAGNOSIS — R7881 Bacteremia: Secondary | ICD-10-CM | POA: Diagnosis not present

## 2023-12-05 ENCOUNTER — Inpatient Hospital Stay: Attending: Hematology and Oncology | Admitting: Hematology and Oncology

## 2023-12-05 VITALS — BP 178/70 | HR 96 | Temp 97.9°F | Resp 19 | Ht 67.0 in | Wt 181.7 lb

## 2023-12-05 DIAGNOSIS — E1122 Type 2 diabetes mellitus with diabetic chronic kidney disease: Secondary | ICD-10-CM | POA: Insufficient documentation

## 2023-12-05 DIAGNOSIS — Z992 Dependence on renal dialysis: Secondary | ICD-10-CM | POA: Insufficient documentation

## 2023-12-05 DIAGNOSIS — Z7982 Long term (current) use of aspirin: Secondary | ICD-10-CM | POA: Diagnosis not present

## 2023-12-05 DIAGNOSIS — D631 Anemia in chronic kidney disease: Secondary | ICD-10-CM | POA: Insufficient documentation

## 2023-12-05 DIAGNOSIS — I509 Heart failure, unspecified: Secondary | ICD-10-CM | POA: Diagnosis not present

## 2023-12-05 DIAGNOSIS — D61818 Other pancytopenia: Secondary | ICD-10-CM | POA: Diagnosis not present

## 2023-12-05 DIAGNOSIS — Z79899 Other long term (current) drug therapy: Secondary | ICD-10-CM | POA: Diagnosis not present

## 2023-12-05 DIAGNOSIS — N186 End stage renal disease: Secondary | ICD-10-CM | POA: Diagnosis not present

## 2023-12-05 NOTE — Assessment & Plan Note (Signed)
 03/03/2020: WBC 4.8, hemoglobin 9.8, platelets 84 01/11/2021: WBC 3.6, hemoglobin 11.6, platelets 98, creatinine 7.84 03/21/2021: WBC 2.5, hemoglobin 8.3, MCV 98.5, platelets 73, creatinine 15.97 05/16/2021: WBC 3.1, hemoglobin 11, MCV 100.6, platelets 109, SPEP: No M spike, ANA negative, creatinine 11.65, reticulocyte count absolute 104.8, reticulocyte percentage: 3.1%, erythropoietin  163.8, iron saturation 25%, ferritin 827, folate 7.6, B12 302 08/01/2021: RBC 3, hemoglobin 9.6, platelets 91 08/24/2021: WBC 4.5, hemoglobin 10, platelets 86 03/06/2022: WBC 2.4, hemoglobin 10.7, platelets 48, ANC 1.4, ALC 0.6 07/24/2022: WBC 5.2, hemoglobin 12.2, platelets 114 01/29/2023: WBC 2.7, hemoglobin 9.3, platelets 98 11/15/2023: WBC 8.3, hemoglobin 9.6, platelets 105   Patient has multiple chronic illnesses including 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 Based on the recent blood work done on 11/15/2023, his leukopenia has resolved although it could be related to underlying inflammation.  No additional treatment is necessary at this time from us . Return to clinic in 6 months for follow-up of blood work

## 2023-12-05 NOTE — Progress Notes (Signed)
 Patient Care Team: Ngetich, Roxan [name], NP as PCP - General (Family Medicine) Mona Vinie [name], MD as PCP - Cardiology (Cardiology) Curvin Deward MOULD, MD as Consulting Physician (General Surgery) Shellia Oh, MD as Consulting Physician (Pulmonary Disease) Perri Starleen [name], MD as Consulting Physician (Nephrology) Harvey Carlin BRAVO, MD (Inactive) as Consulting Physician (Vascular Surgery) Mona Vinie [name], MD as Consulting Physician (Cardiology) Teressa Toribio SQUIBB, MD (Inactive) as Attending Physician (Gastroenterology) Betsey Channel, MD as Consulting Physician (Nephrology)  DIAGNOSIS:  Encounter Diagnosis  Name Primary?   Pancytopenia (HCC) Yes    CHIEF COMPLIANT: Follow-up to discuss his blood work from recent hospitalization  HISTORY OF PRESENT ILLNESS:  History of Present Illness Kirk [name] is a 63 year old male who presents for follow-up of previously low blood counts.  He has experienced low blood counts for several months, prompting tests and biopsies. Recent blood work shows hemoglobin at 11.6 and an increase in platelet count from the sixties to 100. He is experiencing a flareup of what he calls as gout in his hands.  He plans to see rheumatology.    ALLERGIES:  is allergic to codeine and hydrocodone -acetaminophen .  MEDICATIONS:  Current Outpatient Medications  Medication Sig Dispense Refill   acetaminophen  (TYLENOL ) 500 MG tablet Take 1 tablet (500 mg total) by mouth every 6 (six) hours as needed for moderate pain. (Patient taking differently: Take 1,000 mg by mouth every 6 (six) hours as needed for moderate pain (pain score 4-6) or headache.)     albuterol  (PROVENTIL ) (2.5 MG/3ML) 0.083% nebulizer solution Take 3 mLs (2.5 mg total) by nebulization every 4 (four) hours as needed for wheezing or shortness of breath. 90 mL 0   albuterol  (VENTOLIN  HFA) 108 (90 Base) MCG/ACT inhaler Inhale 2 puffs into the lungs every 6 (six) hours as needed for wheezing or shortness of  breath. 8 g 6   aspirin  EC 81 MG tablet Take 81 mg by mouth daily in the afternoon.     Cholecalciferol  (VITAMIN D3) 25 MCG (1000 UT) CAPS Take 4,000 Units by mouth See admin instructions. Patient takes Earlean Everts, Sat, Sun     doxazosin  (CARDURA ) 4 MG tablet Take 1 tablet (4 mg total) by mouth at bedtime. 90 tablet 3   fluticasone -salmeterol (ADVAIR HFA) 45-21 MCG/ACT inhaler  12 g 11   guaiFENesin  (MUCINEX ) 600 MG 12 hr tablet Take 1,200 mg by mouth 2 (two) times daily as needed for cough or to loosen phlegm.     hydrALAZINE  (APRESOLINE ) 50 MG tablet Take 1 tablet (50 mg total) by mouth 3 (three) times daily. 270 tablet 3   ipratropium-albuterol  (DUONEB) 0.5-2.5 (3) MG/3ML SOLN Take 3 mLs by nebulization every 6 (six) hours. Please take every 6 hours scheduled for next 5 days then as needed. 360 mL 0   isosorbide  mononitrate (IMDUR ) 60 MG 24 hr tablet Take 60 mg by mouth daily.     metoprolol  succinate (TOPROL -XL) 25 MG 24 hr tablet Take 1 tablet (25 mg total) by mouth daily. 30 tablet 0   multivitamin (RENA-VIT) TABS tablet Take 1 tablet by mouth daily. (Patient taking differently: Take 1 tablet by mouth See admin instructions. Take 1 tablet by mouth once daily in the afternoon.) 90 tablet 1   oxyCODONE  (ROXICODONE ) 5 MG immediate release tablet Take 1 tablet (5 mg total) by mouth every 4 (four) hours as needed for severe pain (pain score 7-10). 12 tablet 0   OXYGEN  Inhale into the lungs. 2 L  cont.     pravastatin  (PRAVACHOL ) 40 MG tablet TAKE 1 TABLET BY MOUTH EVERY DAY IN THE EVENING (Patient taking differently: Take 40 mg by mouth at bedtime. TAKE 1 TABLET BY MOUTH EVERY DAY IN THE EVENING) 90 tablet 2   sevelamer  carbonate (RENVELA ) 2.4 g PACK Take 4.8 g by mouth 3 (three) times daily with meals. Also takes with his snacks     sucroferric oxyhydroxide (VELPHORO ) 500 MG chewable tablet Chew 1,000-1,500 mg by mouth See admin instructions. Take 3 tablets (1500mg ) by mouth three times daily with  meals and take 2 tablets (1000mg ) with snacks.     traZODone  (DESYREL ) 50 MG tablet TAKE 0.5-1 TABLET (25-50 MG TOTAL) BY MOUTH AT BEDTIME AS NEEDED. FOR SLEEP (Patient taking differently: Take 50 mg by mouth at bedtime.) 90 tablet 1   UNABLE TO FIND Inhale 1 Device into the lungs at bedtime. CPAP     No current facility-administered medications for this visit.   Facility-Administered Medications Ordered in Other Visits  Medication Dose Route Frequency Provider Last Rate Last Admin   sodium chloride  flush (NS) 0.9 % injection 10 mL  10 mL Intravenous Q12H Christine Males, PA-C        PHYSICAL EXAMINATION: ECOG PERFORMANCE STATUS: 1 - Symptomatic but completely ambulatory  Vitals:   12/05/23 1041  BP: (!) 178/70  Pulse: 96  Resp: 19  Temp: 97.9 F (36.6 C)  SpO2: 100%   Filed Weights   12/05/23 1041  Weight: 181 lb 11.2 oz (82.4 kg)      LABORATORY DATA:  I have reviewed the data as listed    Latest Ref Rng & Units 11/15/2023    5:59 PM 11/15/2023    4:00 PM 11/14/2023    8:17 AM  CMP  Glucose 70 - 99 mg/dL  883    BUN 8 - 23 mg/dL  19    Creatinine 9.38 - 1.24 mg/dL  4.40    Sodium 864 - 854 mmol/L  133    Potassium 3.5 - 5.1 mmol/L  3.9    Chloride 98 - 111 mmol/L  87    CO2 22 - 32 mmol/L  28    Calcium  8.9 - 10.3 mg/dL  8.9    Total Protein 6.5 - 8.1 g/dL 7.1   7.4   Total Bilirubin 0.0 - 1.2 mg/dL 1.5   1.1   Alkaline Phos 38 - 126 U/L 70   71   AST 15 - 41 U/L 18   23   ALT 0 - 44 U/L 16   16     Lab Results  Component Value Date   WBC 8.3 11/15/2023   HGB 11.6 (L) 11/15/2023   HCT 34.2 (L) 11/15/2023   MCV 96.1 11/15/2023   PLT 105 (L) 11/15/2023   NEUTROABS 6.9 11/15/2023    ASSESSMENT & PLAN:  Pancytopenia (HCC) 03/03/2020: WBC 4.8, hemoglobin 9.8, platelets 84 01/11/2021: WBC 3.6, hemoglobin 11.6, platelets 98, creatinine 7.84 03/21/2021: WBC 2.5, hemoglobin 8.3, MCV 98.5, platelets 73, creatinine 15.97 05/16/2021: WBC 3.1, hemoglobin 11, MCV  100.6, platelets 109, SPEP: No M spike, ANA negative, creatinine 11.65, reticulocyte count absolute 104.8, reticulocyte percentage: 3.1%, erythropoietin  163.8, iron saturation 25%, ferritin 827, folate 7.6, B12 302 08/01/2021: RBC 3, hemoglobin 9.6, platelets 91 08/24/2021: WBC 4.5, hemoglobin 10, platelets 86 03/06/2022: WBC 2.4, hemoglobin 10.7, platelets 48, ANC 1.4, ALC 0.6 07/24/2022: WBC 5.2, hemoglobin 12.2, platelets 114 01/29/2023: WBC 2.7, hemoglobin 9.3, platelets 98 11/15/2023: WBC 8.3, hemoglobin  9.6, platelets 105   Patient has multiple chronic illnesses including 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 Based on the recent blood work done on 11/15/2023, his leukopenia has resolved although it could be related to underlying inflammation.  No additional treatment is necessary at this time from us . Return to clinic on an as-needed basis      No orders of the defined types were placed in this encounter.  The patient has a good understanding of the overall plan. he agrees with it. he will call with any problems that may develop before the next visit here.  I personally spent a total of 30 minutes in the care of the patient today including preparing to see the patient, getting/reviewing separately obtained history, performing a medically appropriate exam/evaluation, counseling and educating, placing orders, referring and communicating with other health care professionals, documenting clinical information in  the EHR, independently interpreting results, communicating results, and coordinating care.   Viinay K Ashrith Sagan, MD 12/05/23

## 2023-12-06 DIAGNOSIS — N2581 Secondary hyperparathyroidism of renal origin: Secondary | ICD-10-CM | POA: Diagnosis not present

## 2023-12-06 DIAGNOSIS — R7881 Bacteremia: Secondary | ICD-10-CM | POA: Diagnosis not present

## 2023-12-06 DIAGNOSIS — D509 Iron deficiency anemia, unspecified: Secondary | ICD-10-CM | POA: Diagnosis not present

## 2023-12-06 DIAGNOSIS — Z992 Dependence on renal dialysis: Secondary | ICD-10-CM | POA: Diagnosis not present

## 2023-12-06 DIAGNOSIS — D631 Anemia in chronic kidney disease: Secondary | ICD-10-CM | POA: Diagnosis not present

## 2023-12-06 DIAGNOSIS — N186 End stage renal disease: Secondary | ICD-10-CM | POA: Diagnosis not present

## 2023-12-09 ENCOUNTER — Emergency Department (HOSPITAL_COMMUNITY)

## 2023-12-09 ENCOUNTER — Inpatient Hospital Stay (HOSPITAL_COMMUNITY)
Admission: EM | Admit: 2023-12-09 | Discharge: 2023-12-18 | DRG: 286 | Disposition: A | Discharge status: Home / Self Care | Attending: Family Medicine | Admitting: Family Medicine

## 2023-12-09 ENCOUNTER — Other Ambulatory Visit: Payer: Self-pay

## 2023-12-09 ENCOUNTER — Encounter (HOSPITAL_COMMUNITY): Payer: Self-pay | Admitting: Emergency Medicine

## 2023-12-09 DIAGNOSIS — N2581 Secondary hyperparathyroidism of renal origin: Secondary | ICD-10-CM | POA: Diagnosis present

## 2023-12-09 DIAGNOSIS — Z885 Allergy status to narcotic agent status: Secondary | ICD-10-CM

## 2023-12-09 DIAGNOSIS — Z79899 Other long term (current) drug therapy: Secondary | ICD-10-CM

## 2023-12-09 DIAGNOSIS — Y830 Surgical operation with transplant of whole organ as the cause of abnormal reaction of the patient, or of later complication, without mention of misadventure at the time of the procedure: Secondary | ICD-10-CM | POA: Diagnosis present

## 2023-12-09 DIAGNOSIS — K219 Gastro-esophageal reflux disease without esophagitis: Secondary | ICD-10-CM | POA: Diagnosis present

## 2023-12-09 DIAGNOSIS — D509 Iron deficiency anemia, unspecified: Secondary | ICD-10-CM | POA: Diagnosis not present

## 2023-12-09 DIAGNOSIS — Z87891 Personal history of nicotine dependence: Secondary | ICD-10-CM

## 2023-12-09 DIAGNOSIS — I21A1 Myocardial infarction type 2: Secondary | ICD-10-CM | POA: Diagnosis not present

## 2023-12-09 DIAGNOSIS — I700 Atherosclerosis of aorta: Secondary | ICD-10-CM | POA: Diagnosis present

## 2023-12-09 DIAGNOSIS — I6789 Other cerebrovascular disease: Secondary | ICD-10-CM | POA: Diagnosis present

## 2023-12-09 DIAGNOSIS — R7881 Bacteremia: Secondary | ICD-10-CM | POA: Diagnosis not present

## 2023-12-09 DIAGNOSIS — Z9981 Dependence on supplemental oxygen: Secondary | ICD-10-CM

## 2023-12-09 DIAGNOSIS — I2510 Atherosclerotic heart disease of native coronary artery without angina pectoris: Secondary | ICD-10-CM | POA: Diagnosis present

## 2023-12-09 DIAGNOSIS — J9610 Chronic respiratory failure, unspecified whether with hypoxia or hypercapnia: Secondary | ICD-10-CM | POA: Diagnosis present

## 2023-12-09 DIAGNOSIS — E1122 Type 2 diabetes mellitus with diabetic chronic kidney disease: Secondary | ICD-10-CM | POA: Diagnosis present

## 2023-12-09 DIAGNOSIS — T8612 Kidney transplant failure: Secondary | ICD-10-CM | POA: Diagnosis present

## 2023-12-09 DIAGNOSIS — Z59868 Other specified financial insecurity: Secondary | ICD-10-CM

## 2023-12-09 DIAGNOSIS — I953 Hypotension of hemodialysis: Principal | ICD-10-CM | POA: Diagnosis present

## 2023-12-09 DIAGNOSIS — I272 Pulmonary hypertension, unspecified: Secondary | ICD-10-CM | POA: Diagnosis present

## 2023-12-09 DIAGNOSIS — E559 Vitamin D deficiency, unspecified: Secondary | ICD-10-CM | POA: Diagnosis present

## 2023-12-09 DIAGNOSIS — I5043 Acute on chronic combined systolic (congestive) and diastolic (congestive) heart failure: Secondary | ICD-10-CM | POA: Diagnosis present

## 2023-12-09 DIAGNOSIS — I110 Hypertensive heart disease with heart failure: Secondary | ICD-10-CM | POA: Diagnosis not present

## 2023-12-09 DIAGNOSIS — D631 Anemia in chronic kidney disease: Secondary | ICD-10-CM | POA: Diagnosis present

## 2023-12-09 DIAGNOSIS — I214 Non-ST elevation (NSTEMI) myocardial infarction: Secondary | ICD-10-CM | POA: Diagnosis not present

## 2023-12-09 DIAGNOSIS — I480 Paroxysmal atrial fibrillation: Secondary | ICD-10-CM | POA: Diagnosis present

## 2023-12-09 DIAGNOSIS — E669 Obesity, unspecified: Secondary | ICD-10-CM | POA: Diagnosis present

## 2023-12-09 DIAGNOSIS — E785 Hyperlipidemia, unspecified: Secondary | ICD-10-CM | POA: Diagnosis present

## 2023-12-09 DIAGNOSIS — G4733 Obstructive sleep apnea (adult) (pediatric): Secondary | ICD-10-CM | POA: Diagnosis present

## 2023-12-09 DIAGNOSIS — F411 Generalized anxiety disorder: Secondary | ICD-10-CM | POA: Diagnosis present

## 2023-12-09 DIAGNOSIS — E8770 Fluid overload, unspecified: Secondary | ICD-10-CM | POA: Diagnosis not present

## 2023-12-09 DIAGNOSIS — R519 Headache, unspecified: Secondary | ICD-10-CM | POA: Diagnosis present

## 2023-12-09 DIAGNOSIS — Z7982 Long term (current) use of aspirin: Secondary | ICD-10-CM

## 2023-12-09 DIAGNOSIS — Z8701 Personal history of pneumonia (recurrent): Secondary | ICD-10-CM

## 2023-12-09 DIAGNOSIS — Z905 Acquired absence of kidney: Secondary | ICD-10-CM

## 2023-12-09 DIAGNOSIS — B954 Other streptococcus as the cause of diseases classified elsewhere: Secondary | ICD-10-CM | POA: Diagnosis not present

## 2023-12-09 DIAGNOSIS — R079 Chest pain, unspecified: Secondary | ICD-10-CM | POA: Diagnosis present

## 2023-12-09 DIAGNOSIS — I672 Cerebral atherosclerosis: Secondary | ICD-10-CM | POA: Diagnosis not present

## 2023-12-09 DIAGNOSIS — Z1152 Encounter for screening for COVID-19: Secondary | ICD-10-CM | POA: Diagnosis not present

## 2023-12-09 DIAGNOSIS — Q8781 Alport syndrome: Secondary | ICD-10-CM

## 2023-12-09 DIAGNOSIS — Z7951 Long term (current) use of inhaled steroids: Secondary | ICD-10-CM

## 2023-12-09 DIAGNOSIS — N186 End stage renal disease: Secondary | ICD-10-CM | POA: Diagnosis present

## 2023-12-09 DIAGNOSIS — Z6830 Body mass index (BMI) 30.0-30.9, adult: Secondary | ICD-10-CM

## 2023-12-09 DIAGNOSIS — K769 Liver disease, unspecified: Secondary | ICD-10-CM | POA: Diagnosis present

## 2023-12-09 DIAGNOSIS — Z992 Dependence on renal dialysis: Secondary | ICD-10-CM

## 2023-12-09 DIAGNOSIS — I428 Other cardiomyopathies: Secondary | ICD-10-CM | POA: Diagnosis present

## 2023-12-09 DIAGNOSIS — R000 Tachycardia, unspecified: Secondary | ICD-10-CM | POA: Diagnosis not present

## 2023-12-09 DIAGNOSIS — R55 Syncope and collapse: Secondary | ICD-10-CM | POA: Diagnosis not present

## 2023-12-09 DIAGNOSIS — R918 Other nonspecific abnormal finding of lung field: Secondary | ICD-10-CM | POA: Diagnosis not present

## 2023-12-09 DIAGNOSIS — A408 Other streptococcal sepsis: Secondary | ICD-10-CM | POA: Diagnosis not present

## 2023-12-09 DIAGNOSIS — I350 Nonrheumatic aortic (valve) stenosis: Secondary | ICD-10-CM | POA: Diagnosis present

## 2023-12-09 DIAGNOSIS — I517 Cardiomegaly: Secondary | ICD-10-CM | POA: Diagnosis not present

## 2023-12-09 DIAGNOSIS — I080 Rheumatic disorders of both mitral and aortic valves: Secondary | ICD-10-CM | POA: Diagnosis not present

## 2023-12-09 DIAGNOSIS — I2489 Other forms of acute ischemic heart disease: Secondary | ICD-10-CM | POA: Diagnosis present

## 2023-12-09 DIAGNOSIS — I132 Hypertensive heart and chronic kidney disease with heart failure and with stage 5 chronic kidney disease, or end stage renal disease: Secondary | ICD-10-CM | POA: Diagnosis present

## 2023-12-09 DIAGNOSIS — N400 Enlarged prostate without lower urinary tract symptoms: Secondary | ICD-10-CM | POA: Diagnosis present

## 2023-12-09 DIAGNOSIS — G609 Hereditary and idiopathic neuropathy, unspecified: Secondary | ICD-10-CM | POA: Diagnosis present

## 2023-12-09 DIAGNOSIS — R9431 Abnormal electrocardiogram [ECG] [EKG]: Secondary | ICD-10-CM | POA: Diagnosis present

## 2023-12-09 DIAGNOSIS — I5020 Unspecified systolic (congestive) heart failure: Secondary | ICD-10-CM | POA: Diagnosis not present

## 2023-12-09 DIAGNOSIS — G40209 Localization-related (focal) (partial) symptomatic epilepsy and epileptic syndromes with complex partial seizures, not intractable, without status epilepticus: Secondary | ICD-10-CM | POA: Diagnosis present

## 2023-12-09 DIAGNOSIS — I5042 Chronic combined systolic (congestive) and diastolic (congestive) heart failure: Secondary | ICD-10-CM | POA: Diagnosis not present

## 2023-12-09 DIAGNOSIS — Z87820 Personal history of traumatic brain injury: Secondary | ICD-10-CM

## 2023-12-09 DIAGNOSIS — J45909 Unspecified asthma, uncomplicated: Secondary | ICD-10-CM | POA: Diagnosis present

## 2023-12-09 DIAGNOSIS — Z952 Presence of prosthetic heart valve: Secondary | ICD-10-CM | POA: Diagnosis not present

## 2023-12-09 DIAGNOSIS — B955 Unspecified streptococcus as the cause of diseases classified elsewhere: Secondary | ICD-10-CM | POA: Diagnosis not present

## 2023-12-09 DIAGNOSIS — G8929 Other chronic pain: Secondary | ICD-10-CM | POA: Diagnosis present

## 2023-12-09 DIAGNOSIS — G4700 Insomnia, unspecified: Secondary | ICD-10-CM | POA: Diagnosis present

## 2023-12-09 DIAGNOSIS — R0789 Other chest pain: Secondary | ICD-10-CM | POA: Diagnosis not present

## 2023-12-09 LAB — I-STAT CHEM 8, ED
BUN: 15 mg/dL (ref 8–23)
Calcium, Ion: 0.99 mmol/L — ABNORMAL LOW (ref 1.15–1.40)
Chloride: 93 mmol/L — ABNORMAL LOW (ref 98–111)
Creatinine, Ser: 5.6 mg/dL — ABNORMAL HIGH (ref 0.61–1.24)
Glucose, Bld: 113 mg/dL — ABNORMAL HIGH (ref 70–99)
HCT: 24 % — ABNORMAL LOW (ref 39.0–52.0)
Hemoglobin: 8.2 g/dL — ABNORMAL LOW (ref 13.0–17.0)
Potassium: 3.7 mmol/L (ref 3.5–5.1)
Sodium: 136 mmol/L (ref 135–145)
TCO2: 31 mmol/L (ref 22–32)

## 2023-12-09 LAB — CBC
HCT: 22.6 % — ABNORMAL LOW (ref 39.0–52.0)
Hemoglobin: 7.3 g/dL — ABNORMAL LOW (ref 13.0–17.0)
MCH: 31.3 pg (ref 26.0–34.0)
MCHC: 32.3 g/dL (ref 30.0–36.0)
MCV: 97 fL (ref 80.0–100.0)
Platelets: 128 K/uL — ABNORMAL LOW (ref 150–400)
RBC: 2.33 MIL/uL — ABNORMAL LOW (ref 4.22–5.81)
RDW: 15.6 % — ABNORMAL HIGH (ref 11.5–15.5)
WBC: 5.4 K/uL (ref 4.0–10.5)
nRBC: 0 % (ref 0.0–0.2)

## 2023-12-09 LAB — BASIC METABOLIC PANEL WITH GFR
Anion gap: 15 (ref 5–15)
BUN: 13 mg/dL (ref 8–23)
CO2: 28 mmol/L (ref 22–32)
Calcium: 8.2 mg/dL — ABNORMAL LOW (ref 8.9–10.3)
Chloride: 92 mmol/L — ABNORMAL LOW (ref 98–111)
Creatinine, Ser: 5.37 mg/dL — ABNORMAL HIGH (ref 0.61–1.24)
GFR, Estimated: 11 mL/min — ABNORMAL LOW (ref >60)
Glucose, Bld: 136 mg/dL — ABNORMAL HIGH (ref 70–99)
Potassium: 3.4 mmol/L — ABNORMAL LOW (ref 3.5–5.1)
Sodium: 135 mmol/L (ref 135–145)

## 2023-12-09 LAB — PROTIME-INR
INR: 1.1 (ref 0.8–1.2)
Prothrombin Time: 15.3 s — ABNORMAL HIGH (ref 11.4–15.2)

## 2023-12-09 LAB — BRAIN NATRIURETIC PEPTIDE: B Natriuretic Peptide: 2729.6 pg/mL — ABNORMAL HIGH (ref 0.0–100.0)

## 2023-12-09 LAB — TROPONIN I (HIGH SENSITIVITY): Troponin I (High Sensitivity): 153 ng/L (ref <18)

## 2023-12-09 LAB — I-STAT CG4 LACTIC ACID, ED: Lactic Acid, Venous: 1 mmol/L (ref 0.5–1.9)

## 2023-12-09 MED ORDER — ASPIRIN 81 MG PO CHEW: 324.0000 mg | CHEWABLE_TABLET | Freq: Once | ORAL | Status: DC

## 2023-12-09 MED ORDER — LACTATED RINGERS IV SOLN: INTRAVENOUS | Status: DC

## 2023-12-09 MED ORDER — ACETAMINOPHEN 500 MG PO TABS
1000.0000 mg | ORAL_TABLET | Freq: Once | ORAL | Status: AC
  Administered 2023-12-09: 1000 mg via ORAL
  Filled 2023-12-09: qty 2

## 2023-12-09 MED ORDER — LACTATED RINGERS IV BOLUS (SEPSIS)
1000.0000 mL | Freq: Once | INTRAVENOUS | Status: AC
  Administered 2023-12-09: 1000 mL via INTRAVENOUS

## 2023-12-09 MED ORDER — SODIUM CHLORIDE 0.9 % IV SOLN
2.0000 g | Freq: Once | INTRAVENOUS | Status: AC
  Administered 2023-12-09: 2 g via INTRAVENOUS
  Filled 2023-12-09: qty 12.5

## 2023-12-09 MED ORDER — VANCOMYCIN HCL IN DEXTROSE 1-5 GM/200ML-% IV SOLN: 1000.0000 mg | Freq: Once | INTRAVENOUS | Status: DC

## 2023-12-09 MED ORDER — METRONIDAZOLE 500 MG/100ML IV SOLN
500.0000 mg | Freq: Once | INTRAVENOUS | Status: AC
  Administered 2023-12-09: 500 mg via INTRAVENOUS
  Filled 2023-12-09: qty 100

## 2023-12-09 MED ORDER — VANCOMYCIN HCL 1750 MG/350ML IV SOLN
1750.0000 mg | Freq: Once | INTRAVENOUS | Status: AC
  Administered 2023-12-09: 1750 mg via INTRAVENOUS
  Filled 2023-12-09: qty 350

## 2023-12-09 NOTE — ED Triage Notes (Addendum)
 BIB [facility] from home with CP, Pt is currently a MWF dialysis pt and went to dialysis today. Pt has been having episodes of chest pain for approx 2 months, EMS reports that pt is supposed to be getting scheduled for open heart surgery soon. EMS also reports that pt had medication non-compliance r/t affordability of meds. Admin of 324 mg aspirin  PO reported by EMS.  BP 146/98 HR 150 Spo2 98 CBG 195 18 g RAC

## 2023-12-09 NOTE — ED Provider Notes (Signed)
 [place] EMERGENCY DEPARTMENT AT [facility] Provider Note   CSN: [id number] Arrival date & time: 12/09/23  2115     Patient presents with: Chest Pain   Kirk [name] is a 63 y.o. male.  {Add pertinent medical, surgical, social history, OB history to HPI:32947} Hx of  ESRD on dialysis, chronic respiratory failure on 3 L, hypertension, OSA, CHF, and multiple other comorbidities presenting with chest pain intermittently for the past 2 months, worse over the past 3 days.  History per patient.  Endorses he usually develops chest pain after receiving dialysis, received dialysis today and states that he developed chest pain.  He also endorses he developed a headache today associated with his dialysis.  He states that normally does not get headache, chest pain feels worse than normal.  States that the chest pain primarily radiates up into his head, into the posterior head.  Denies fever or chills.  Denies nausea or vomiting.  Does note that he had reduced dialysis session today secondary to the chest pain, approximately 30 minutes shortened. EMS given 324 mg ASA.  On chart review, patient was seen 11/15/2023 with similar symptoms of chest pain.  At that time, troponins were overall reassuring, did have an episode of SVT that converted with Cardizem .  Was discharged home in stable condition.  Patient is supposed to be scheduled for aortic valve replacement, TTE in September 2025 with LVEF 25 to 30%.  Angiography also performed September 2025 with minimal coronary obstruction appreciated.   Chest Pain      Prior to Admission medications   Medication Sig Start Date End Date Taking? Authorizing Provider  acetaminophen  (TYLENOL ) 500 MG tablet Take 1 tablet (500 mg total) by mouth every 6 (six) hours as needed for moderate pain. Patient taking differently: Take 1,000 mg by mouth every 6 (six) hours as needed for moderate pain (pain score 4-6) or headache. 09/12/14   Jadine Toribio SQUIBB, MD   albuterol  (PROVENTIL ) (2.5 MG/3ML) 0.083% nebulizer solution Take 3 mLs (2.5 mg total) by nebulization every 4 (four) hours as needed for wheezing or shortness of breath. 06/25/23   Elgergawy, Brayton RAMAN, MD  albuterol  (VENTOLIN  HFA) 108 (90 Base) MCG/ACT inhaler Inhale 2 puffs into the lungs every 6 (six) hours as needed for wheezing or shortness of breath. 10/29/22   Darlean Ozell KATHEE, MD  aspirin  EC 81 MG tablet Take 81 mg by mouth daily in the afternoon.    [provider]  Cholecalciferol  (VITAMIN D3) 25 MCG (1000 UT) CAPS Take 4,000 Units by mouth See admin instructions. Patient takes Earlean Everts, Sat, Sun    [provider]  doxazosin  (CARDURA ) 4 MG tablet Take 1 tablet (4 mg total) by mouth at bedtime. 05/31/23   Walker, Caitlin S, NP  fluticasone -salmeterol (ADVAIR HFA) 45-21 MCG/ACT inhaler  07/05/23   Darlean Ozell KATHEE, MD  guaiFENesin  (MUCINEX ) 600 MG 12 hr tablet Take 1,200 mg by mouth 2 (two) times daily as needed for cough or to loosen phlegm.    [provider]  hydrALAZINE  (APRESOLINE ) 50 MG tablet Take 1 tablet (50 mg total) by mouth 3 (three) times daily. 10/16/23 01/14/24  Meng, Hao, PA  ipratropium-albuterol  (DUONEB) 0.5-2.5 (3) MG/3ML SOLN Take 3 mLs by nebulization every 6 (six) hours. Please take every 6 hours scheduled for next 5 days then as needed. 06/25/23   Elgergawy, Brayton RAMAN, MD  isosorbide  mononitrate (IMDUR ) 60 MG 24 hr tablet Take 60 mg by mouth daily. 10/04/22  [provider]  metoprolol  succinate (TOPROL -XL) 25 MG 24 hr tablet Take 1 tablet (25 mg total) by mouth daily. 10/07/23   Pahwani, Ravi, MD  multivitamin (RENA-VIT) TABS tablet Take 1 tablet by mouth daily. Patient taking differently: Take 1 tablet by mouth See admin instructions. Take 1 tablet by mouth once daily in the afternoon. 01/29/23   Ngetich, Dinah C, NP  oxyCODONE  (ROXICODONE ) 5 MG immediate release tablet Take 1 tablet (5 mg total) by mouth every 4 (four) hours as needed for  severe pain (pain score 7-10). 11/15/23   Freddi Hamilton, MD  OXYGEN  Inhale into the lungs. 2 L  cont.    [provider]  pravastatin  (PRAVACHOL ) 40 MG tablet TAKE 1 TABLET BY MOUTH EVERY DAY IN THE EVENING Patient taking differently: Take 40 mg by mouth at bedtime. TAKE 1 TABLET BY MOUTH EVERY DAY IN THE EVENING 12/04/22   Ngetich, Dinah C, NP  sevelamer  carbonate (RENVELA ) 2.4 g PACK Take 4.8 g by mouth 3 (three) times daily with meals. Also takes with his snacks    [provider]  sucroferric oxyhydroxide (VELPHORO ) 500 MG chewable tablet Chew 1,000-1,500 mg by mouth See admin instructions. Take 3 tablets (1500mg ) by mouth three times daily with meals and take 2 tablets (1000mg ) with snacks.    [provider]  traZODone  (DESYREL ) 50 MG tablet TAKE 0.5-1 TABLET (25-50 MG TOTAL) BY MOUTH AT BEDTIME AS NEEDED. FOR SLEEP Patient taking differently: Take 50 mg by mouth at bedtime. 04/08/23   Ngetich, Dinah C, NP  UNABLE TO FIND Inhale 1 Device into the lungs at bedtime. CPAP    [provider]    Allergies: Codeine and Hydrocodone -acetaminophen     Review of Systems  Cardiovascular:  Positive for chest pain.    Updated Vital Signs BP 130/67   Pulse (!) 105   Temp 100.2 F (37.9 C) (Oral)   Resp (!) 22   Ht 5' 7 (1.702 m)   Wt 82 kg   SpO2 99%   BMI 28.31 kg/m   Physical Exam Vitals and nursing note reviewed.  Constitutional:      General: He is in acute distress.     Appearance: He is well-developed. He is ill-appearing.  HENT:     Head: Normocephalic and atraumatic.  Cardiovascular:     Rate and Rhythm: Regular rhythm. Tachycardia present.     Pulses:          Carotid pulses are 2+ on the right side and 2+ on the left side.      Radial pulses are 2+ on the right side and 2+ on the left side.       Dorsalis pedis pulses are 2+ on the right side and 2+ on the left side.       Posterior tibial pulses are 2+ on the right side and 2+ on the  left side.     Heart sounds: Murmur heard.     Decrescendo systolic murmur is present with a grade of 3/6.  Pulmonary:     Effort: Pulmonary effort is normal.     Breath sounds: Normal breath sounds. No decreased breath sounds, wheezing, rhonchi or rales.  Abdominal:     Palpations: Abdomen is soft.     Tenderness: There is no abdominal tenderness.  Genitourinary:    Rectum: Guaiac result negative.     Comments: No gross blood or melena on rectal exam, guiac negative Musculoskeletal:     Cervical back: Neck  supple.     Right lower leg: Edema (1+) present.     Left lower leg: Edema (1+) present.  Skin:    General: Skin is warm.     Capillary Refill: Capillary refill takes less than 2 seconds.  Neurological:     Mental Status: He is alert and oriented to person, place, and time.     (all labs ordered are listed, but only abnormal results are displayed) Labs Reviewed  CBC - Abnormal; Notable for the following components:      Result Value   RBC 2.33 (*)    Hemoglobin 7.3 (*)    HCT 22.6 (*)    RDW 15.6 (*)    Platelets 128 (*)    All other components within normal limits  I-STAT CHEM 8, ED - Abnormal; Notable for the following components:   Chloride 93 (*)    Creatinine, Ser 5.60 (*)    Glucose, Bld 113 (*)    Calcium , Ion 0.99 (*)    Hemoglobin 8.2 (*)    HCT 24.0 (*)    All other components within normal limits  RESP PANEL BY RT-PCR (RSV, FLU A&B, COVID)  RVPGX2  CULTURE, BLOOD (ROUTINE X 2)  CULTURE, BLOOD (ROUTINE X 2)  BASIC METABOLIC PANEL WITH GFR  PROTIME-INR  BRAIN NATRIURETIC PEPTIDE  I-STAT CG4 LACTIC ACID, ED  TROPONIN I (HIGH SENSITIVITY)    EKG: None  Radiology: No results found.  {Document cardiac monitor, telemetry assessment procedure when appropriate:32947} Procedures   Medications Ordered in the ED  lactated ringers  infusion (has no administration in time range)  lactated ringers  bolus 1,000 mL (1,000 mLs Intravenous New Bag/Given  12/09/23 2236)  ceFEPIme  (MAXIPIME ) 2 g in sodium chloride  0.9 % 100 mL IVPB (2 g Intravenous New Bag/Given 12/09/23 2238)  metroNIDAZOLE  (FLAGYL ) IVPB 500 mg (500 mg Intravenous New Bag/Given 12/09/23 2240)  vancomycin  (VANCOREADY) IVPB 1750 mg/350 mL (has no administration in time range)  acetaminophen  (TYLENOL ) tablet 1,000 mg (has no administration in time range)      {Click here for ABCD2, HEART and other calculators REFRESH Note before signing:1}                              Medical Decision Making Amount and/or Complexity of Data Reviewed Labs: ordered. Radiology: ordered.  Risk OTC drugs. Prescription drug management.   ***  {Document critical care time when appropriate  Document review of labs and clinical decision tools ie CHADS2VASC2, etc  Document your independent review of radiology images and any outside records  Document your discussion with family members, caretakers and with consultants  Document social determinants of health affecting pt's care  Document your decision making why or why not admission, treatments were needed:32947:::1}   Final diagnoses:  None    ED Discharge Orders     None

## 2023-12-09 NOTE — Progress Notes (Signed)
 Elink monitoring for the code sepsis protocol.

## 2023-12-10 ENCOUNTER — Other Ambulatory Visit: Payer: Self-pay

## 2023-12-10 ENCOUNTER — Inpatient Hospital Stay (HOSPITAL_COMMUNITY)

## 2023-12-10 DIAGNOSIS — I2489 Other forms of acute ischemic heart disease: Secondary | ICD-10-CM | POA: Diagnosis present

## 2023-12-10 DIAGNOSIS — R079 Chest pain, unspecified: Secondary | ICD-10-CM | POA: Diagnosis present

## 2023-12-10 DIAGNOSIS — A408 Other streptococcal sepsis: Secondary | ICD-10-CM | POA: Diagnosis not present

## 2023-12-10 DIAGNOSIS — N2581 Secondary hyperparathyroidism of renal origin: Secondary | ICD-10-CM | POA: Diagnosis present

## 2023-12-10 DIAGNOSIS — Z952 Presence of prosthetic heart valve: Secondary | ICD-10-CM | POA: Diagnosis not present

## 2023-12-10 DIAGNOSIS — E8770 Fluid overload, unspecified: Secondary | ICD-10-CM | POA: Diagnosis not present

## 2023-12-10 DIAGNOSIS — I5043 Acute on chronic combined systolic (congestive) and diastolic (congestive) heart failure: Secondary | ICD-10-CM | POA: Diagnosis present

## 2023-12-10 DIAGNOSIS — I2510 Atherosclerotic heart disease of native coronary artery without angina pectoris: Secondary | ICD-10-CM | POA: Diagnosis not present

## 2023-12-10 DIAGNOSIS — I953 Hypotension of hemodialysis: Secondary | ICD-10-CM | POA: Diagnosis present

## 2023-12-10 DIAGNOSIS — E669 Obesity, unspecified: Secondary | ICD-10-CM | POA: Diagnosis present

## 2023-12-10 DIAGNOSIS — Z1152 Encounter for screening for COVID-19: Secondary | ICD-10-CM | POA: Diagnosis not present

## 2023-12-10 DIAGNOSIS — E1122 Type 2 diabetes mellitus with diabetic chronic kidney disease: Secondary | ICD-10-CM | POA: Diagnosis present

## 2023-12-10 DIAGNOSIS — R7881 Bacteremia: Secondary | ICD-10-CM | POA: Diagnosis not present

## 2023-12-10 DIAGNOSIS — I700 Atherosclerosis of aorta: Secondary | ICD-10-CM | POA: Diagnosis present

## 2023-12-10 DIAGNOSIS — D631 Anemia in chronic kidney disease: Secondary | ICD-10-CM | POA: Diagnosis present

## 2023-12-10 DIAGNOSIS — Z992 Dependence on renal dialysis: Secondary | ICD-10-CM | POA: Diagnosis not present

## 2023-12-10 DIAGNOSIS — I214 Non-ST elevation (NSTEMI) myocardial infarction: Secondary | ICD-10-CM | POA: Diagnosis not present

## 2023-12-10 DIAGNOSIS — I080 Rheumatic disorders of both mitral and aortic valves: Secondary | ICD-10-CM | POA: Diagnosis not present

## 2023-12-10 DIAGNOSIS — I480 Paroxysmal atrial fibrillation: Secondary | ICD-10-CM | POA: Diagnosis present

## 2023-12-10 DIAGNOSIS — N186 End stage renal disease: Secondary | ICD-10-CM | POA: Diagnosis present

## 2023-12-10 DIAGNOSIS — G40209 Localization-related (focal) (partial) symptomatic epilepsy and epileptic syndromes with complex partial seizures, not intractable, without status epilepticus: Secondary | ICD-10-CM | POA: Diagnosis present

## 2023-12-10 DIAGNOSIS — I110 Hypertensive heart disease with heart failure: Secondary | ICD-10-CM | POA: Diagnosis not present

## 2023-12-10 DIAGNOSIS — T8612 Kidney transplant failure: Secondary | ICD-10-CM | POA: Diagnosis present

## 2023-12-10 DIAGNOSIS — J9610 Chronic respiratory failure, unspecified whether with hypoxia or hypercapnia: Secondary | ICD-10-CM | POA: Diagnosis present

## 2023-12-10 DIAGNOSIS — B954 Other streptococcus as the cause of diseases classified elsewhere: Secondary | ICD-10-CM | POA: Diagnosis not present

## 2023-12-10 DIAGNOSIS — J45909 Unspecified asthma, uncomplicated: Secondary | ICD-10-CM | POA: Diagnosis present

## 2023-12-10 DIAGNOSIS — Q8781 Alport syndrome: Secondary | ICD-10-CM | POA: Diagnosis not present

## 2023-12-10 DIAGNOSIS — I5020 Unspecified systolic (congestive) heart failure: Secondary | ICD-10-CM | POA: Diagnosis not present

## 2023-12-10 DIAGNOSIS — I272 Pulmonary hypertension, unspecified: Secondary | ICD-10-CM | POA: Diagnosis present

## 2023-12-10 DIAGNOSIS — K769 Liver disease, unspecified: Secondary | ICD-10-CM | POA: Diagnosis present

## 2023-12-10 DIAGNOSIS — Z87891 Personal history of nicotine dependence: Secondary | ICD-10-CM | POA: Diagnosis not present

## 2023-12-10 DIAGNOSIS — Y830 Surgical operation with transplant of whole organ as the cause of abnormal reaction of the patient, or of later complication, without mention of misadventure at the time of the procedure: Secondary | ICD-10-CM | POA: Diagnosis present

## 2023-12-10 DIAGNOSIS — I428 Other cardiomyopathies: Secondary | ICD-10-CM | POA: Diagnosis present

## 2023-12-10 DIAGNOSIS — I6789 Other cerebrovascular disease: Secondary | ICD-10-CM | POA: Diagnosis present

## 2023-12-10 DIAGNOSIS — I21A1 Myocardial infarction type 2: Secondary | ICD-10-CM | POA: Diagnosis not present

## 2023-12-10 DIAGNOSIS — I132 Hypertensive heart and chronic kidney disease with heart failure and with stage 5 chronic kidney disease, or end stage renal disease: Secondary | ICD-10-CM | POA: Diagnosis present

## 2023-12-10 DIAGNOSIS — I350 Nonrheumatic aortic (valve) stenosis: Secondary | ICD-10-CM | POA: Diagnosis not present

## 2023-12-10 LAB — CBC
HCT: 25.5 % — ABNORMAL LOW (ref 39.0–52.0)
Hemoglobin: 7.9 g/dL — ABNORMAL LOW (ref 13.0–17.0)
MCH: 30.9 pg (ref 26.0–34.0)
MCHC: 31 g/dL (ref 30.0–36.0)
MCV: 99.6 fL (ref 80.0–100.0)
Platelets: 130 K/uL — ABNORMAL LOW (ref 150–400)
RBC: 2.56 MIL/uL — ABNORMAL LOW (ref 4.22–5.81)
RDW: 15.9 % — ABNORMAL HIGH (ref 11.5–15.5)
WBC: 5.3 K/uL (ref 4.0–10.5)
nRBC: 0 % (ref 0.0–0.2)

## 2023-12-10 LAB — BLOOD CULTURE ID PANEL (REFLEXED) - BCID2

## 2023-12-10 LAB — CBC WITH DIFFERENTIAL/PLATELET
Abs Immature Granulocytes: 0.03 K/uL (ref 0.00–0.07)
Basophils Absolute: 0 K/uL (ref 0.0–0.1)
Basophils Relative: 1 %
Eosinophils Absolute: 0 K/uL (ref 0.0–0.5)
Eosinophils Relative: 0 %
HCT: 23.1 % — ABNORMAL LOW (ref 39.0–52.0)
Hemoglobin: 7.4 g/dL — ABNORMAL LOW (ref 13.0–17.0)
Immature Granulocytes: 1 %
Lymphocytes Relative: 7 %
Lymphs Abs: 0.4 K/uL — ABNORMAL LOW (ref 0.7–4.0)
MCH: 31.2 pg (ref 26.0–34.0)
MCHC: 32 g/dL (ref 30.0–36.0)
MCV: 97.5 fL (ref 80.0–100.0)
Monocytes Absolute: 0.5 K/uL (ref 0.1–1.0)
Monocytes Relative: 8 %
Neutro Abs: 4.9 K/uL (ref 1.7–7.7)
Neutrophils Relative %: 83 %
Platelets: 134 K/uL — ABNORMAL LOW (ref 150–400)
RBC: 2.37 MIL/uL — ABNORMAL LOW (ref 4.22–5.81)
RDW: 15.8 % — ABNORMAL HIGH (ref 11.5–15.5)
WBC: 5.8 K/uL (ref 4.0–10.5)
nRBC: 0 % (ref 0.0–0.2)

## 2023-12-10 LAB — RENAL FUNCTION PANEL
Albumin: 2.8 g/dL — ABNORMAL LOW (ref 3.5–5.0)
Anion gap: 18 — ABNORMAL HIGH (ref 5–15)
BUN: 20 mg/dL (ref 8–23)
CO2: 25 mmol/L (ref 22–32)
Calcium: 8.3 mg/dL — ABNORMAL LOW (ref 8.9–10.3)
Chloride: 93 mmol/L — ABNORMAL LOW (ref 98–111)
Creatinine, Ser: 6.47 mg/dL — ABNORMAL HIGH (ref 0.61–1.24)
GFR, Estimated: 9 mL/min — ABNORMAL LOW (ref >60)
Glucose, Bld: 121 mg/dL — ABNORMAL HIGH (ref 70–99)
Phosphorus: 4.4 mg/dL (ref 2.5–4.6)
Potassium: 4.8 mmol/L (ref 3.5–5.1)
Sodium: 136 mmol/L (ref 135–145)

## 2023-12-10 LAB — TROPONIN I (HIGH SENSITIVITY)
Troponin I (High Sensitivity): 1098 ng/L (ref <18)
Troponin I (High Sensitivity): 5953 ng/L (ref <18)
Troponin I (High Sensitivity): 5620 ng/L (ref <18)

## 2023-12-10 LAB — ECHOCARDIOGRAM LIMITED
AV Mean grad: 17 mmHg
AV Peak grad: 31.4 mmHg
Ao pk vel: 2.8 m/s
Area-P 1/2: 5.66 cm2
Height: 67 in
S' Lateral: 5.4 cm
Weight: 2892.44 [oz_av]

## 2023-12-10 LAB — RESP PANEL BY RT-PCR (RSV, FLU A&B, COVID)  RVPGX2
Influenza A by PCR: NEGATIVE
Influenza B by PCR: NEGATIVE
Resp Syncytial Virus by PCR: NEGATIVE
SARS Coronavirus 2 by RT PCR: NEGATIVE

## 2023-12-10 LAB — HEPARIN LEVEL (UNFRACTIONATED)
Heparin Unfractionated: 0.1 [IU]/mL — ABNORMAL LOW (ref 0.30–0.70)
Heparin Unfractionated: 0.68 [IU]/mL (ref 0.30–0.70)

## 2023-12-10 LAB — MAGNESIUM: Magnesium: 1.9 mg/dL (ref 1.7–2.4)

## 2023-12-10 LAB — HEPATITIS B SURFACE ANTIGEN: Hepatitis B Surface Ag: NONREACTIVE

## 2023-12-10 LAB — CBG MONITORING, ED: Glucose-Capillary: 112 mg/dL — ABNORMAL HIGH (ref 70–99)

## 2023-12-10 MED ORDER — HEPARIN SODIUM (PORCINE) 1000 UNIT/ML DIALYSIS: 1500.0000 [IU] | INTRAMUSCULAR | Status: DC | PRN

## 2023-12-10 MED ORDER — HEPARIN (PORCINE) 25000 UT/250ML-% IV SOLN
1400.0000 [IU]/h | INTRAVENOUS | Status: DC
  Administered 2023-12-10: 1000 [IU]/h via INTRAVENOUS
  Filled 2023-12-10 (×2): qty 250

## 2023-12-10 MED ORDER — HEPARIN SODIUM (PORCINE) 1000 UNIT/ML DIALYSIS: 1000.0000 [IU] | INTRAMUSCULAR | Status: DC | PRN

## 2023-12-10 MED ORDER — ALUM & MAG HYDROXIDE-SIMETH 200-200-20 MG/5ML PO SUSP
15.0000 mL | Freq: Once | ORAL | Status: AC
  Administered 2023-12-10: 15 mL via ORAL
  Filled 2023-12-10: qty 30

## 2023-12-10 MED ORDER — HEPARIN BOLUS VIA INFUSION
4000.0000 [IU] | Freq: Once | INTRAVENOUS | Status: AC
  Administered 2023-12-10: 4000 [IU] via INTRAVENOUS
  Filled 2023-12-10: qty 4000

## 2023-12-10 MED ORDER — ALTEPLASE 2 MG IJ SOLR: 2.0000 mg | Freq: Once | INTRAMUSCULAR | Status: DC | PRN

## 2023-12-10 MED ORDER — METOPROLOL SUCCINATE ER 25 MG PO TB24
12.5000 mg | ORAL_TABLET | Freq: Every day | ORAL | Status: DC
  Administered 2023-12-10 – 2023-12-11 (×2): 12.5 mg via ORAL
  Filled 2023-12-10: qty 1

## 2023-12-10 MED ORDER — HEPARIN SODIUM (PORCINE) 1000 UNIT/ML DIALYSIS
2500.0000 [IU] | Freq: Once | INTRAMUSCULAR | Status: AC
  Administered 2023-12-10: 2500 [IU] via INTRAVENOUS_CENTRAL

## 2023-12-10 MED ORDER — PRAVASTATIN SODIUM 40 MG PO TABS
40.0000 mg | ORAL_TABLET | Freq: Every day | ORAL | Status: DC
  Administered 2023-12-10 – 2023-12-17 (×8): 40 mg via ORAL
  Filled 2023-12-10 (×8): qty 1

## 2023-12-10 MED ORDER — MORPHINE SULFATE (PF) 4 MG/ML IV SOLN: 4.0000 mg | INTRAVENOUS | Status: DC | PRN

## 2023-12-10 MED ORDER — PROCHLORPERAZINE EDISYLATE 10 MG/2ML IJ SOLN
5.0000 mg | Freq: Four times a day (QID) | INTRAMUSCULAR | Status: DC | PRN
  Administered 2023-12-10 – 2023-12-16 (×2): 5 mg via INTRAVENOUS
  Filled 2023-12-10 (×2): qty 2

## 2023-12-10 MED ORDER — NITROGLYCERIN IN D5W 200-5 MCG/ML-% IV SOLN
15.0000 ug/min | INTRAVENOUS | Status: AC
  Administered 2023-12-10: 15 ug/min via INTRAVENOUS

## 2023-12-10 MED ORDER — FENTANYL CITRATE (PF) 50 MCG/ML IJ SOSY
50.0000 ug | PREFILLED_SYRINGE | Freq: Once | INTRAMUSCULAR | Status: AC
  Administered 2023-12-10: 50 ug via INTRAVENOUS
  Filled 2023-12-10: qty 1

## 2023-12-10 MED ORDER — MELATONIN 5 MG PO TABS
5.0000 mg | ORAL_TABLET | Freq: Every evening | ORAL | Status: DC | PRN
  Administered 2023-12-12 – 2023-12-18 (×5): 5 mg via ORAL
  Filled 2023-12-10 (×6): qty 1

## 2023-12-10 MED ORDER — HEPARIN BOLUS VIA INFUSION
2000.0000 [IU] | Freq: Once | INTRAVENOUS | Status: AC
  Administered 2023-12-10: 2000 [IU] via INTRAVENOUS
  Filled 2023-12-10: qty 2000

## 2023-12-10 MED ORDER — PENTAFLUOROPROP-TETRAFLUOROETH EX AERO: 1.0000 | INHALATION_SPRAY | CUTANEOUS | Status: DC | PRN

## 2023-12-10 MED ORDER — LIDOCAINE HCL (PF) 1 % IJ SOLN: 5.0000 mL | INTRAMUSCULAR | Status: DC | PRN

## 2023-12-10 MED ORDER — ACETAMINOPHEN 500 MG PO TABS
500.0000 mg | ORAL_TABLET | Freq: Four times a day (QID) | ORAL | Status: DC | PRN
  Administered 2023-12-10 – 2023-12-15 (×2): 500 mg via ORAL
  Filled 2023-12-10 (×2): qty 1

## 2023-12-10 MED ORDER — NITROGLYCERIN IN D5W 200-5 MCG/ML-% IV SOLN
0.0000 ug/min | INTRAVENOUS | Status: DC
Start: 2023-12-10 — End: 2023-12-10
  Administered 2023-12-10: 5 ug/min via INTRAVENOUS
  Filled 2023-12-10: qty 250

## 2023-12-10 MED ORDER — LIDOCAINE-PRILOCAINE 2.5-2.5 % EX CREA: 1.0000 | TOPICAL_CREAM | CUTANEOUS | Status: DC | PRN

## 2023-12-10 MED ORDER — POLYETHYLENE GLYCOL 3350 17 G PO PACK
17.0000 g | PACK | Freq: Every day | ORAL | Status: DC | PRN
  Administered 2023-12-14: 17 g via ORAL
  Filled 2023-12-10: qty 1

## 2023-12-10 MED ORDER — SODIUM CHLORIDE 0.9 % IV SOLN
2.0000 g | INTRAVENOUS | Status: DC
  Administered 2023-12-10 – 2023-12-16 (×7): 2 g via INTRAVENOUS
  Filled 2023-12-10 (×8): qty 20

## 2023-12-10 MED ORDER — NITROGLYCERIN IN D5W 200-5 MCG/ML-% IV SOLN
0.0000 ug/min | INTRAVENOUS | Status: DC
  Administered 2023-12-10: 5 ug/min via INTRAVENOUS

## 2023-12-10 MED ORDER — ANTICOAGULANT SODIUM CITRATE 4% (200MG/5ML) IV SOLN: 5.0000 mL | Status: DC | PRN

## 2023-12-10 MED ORDER — ASPIRIN 81 MG PO TBEC
81.0000 mg | DELAYED_RELEASE_TABLET | Freq: Every day | ORAL | Status: DC
  Administered 2023-12-10 – 2023-12-18 (×8): 81 mg via ORAL
  Filled 2023-12-10 (×8): qty 1

## 2023-12-10 MED ORDER — CHLORHEXIDINE GLUCONATE CLOTH 2 % EX PADS
6.0000 | MEDICATED_PAD | Freq: Every day | CUTANEOUS | Status: DC
  Administered 2023-12-12 – 2023-12-18 (×7): 6 via TOPICAL

## 2023-12-10 MED ORDER — HEPARIN SODIUM (PORCINE) 1000 UNIT/ML IJ SOLN
INTRAMUSCULAR | Status: AC
Start: 2023-12-10 — End: 2023-12-10
  Filled 2023-12-10: qty 3

## 2023-12-10 MED ORDER — HYDROMORPHONE HCL 1 MG/ML IJ SOLN
0.5000 mg | INTRAMUSCULAR | Status: AC
  Administered 2023-12-10: 0.5 mg via INTRAVENOUS
  Filled 2023-12-10: qty 1

## 2023-12-10 MED ORDER — FENTANYL CITRATE (PF) 50 MCG/ML IJ SOSY
50.0000 ug | PREFILLED_SYRINGE | INTRAMUSCULAR | Status: DC | PRN
  Administered 2023-12-10 – 2023-12-16 (×2): 50 ug via INTRAVENOUS
  Filled 2023-12-10 (×2): qty 1

## 2023-12-10 NOTE — Progress Notes (Signed)
  Received patient in bed to unit.   Informed consent signed and in chart.    TX duration:3.5     Transported by  Hand-off given to patient's nurse.    Access used: left avf Access issues: none   Total UF removed: 3000 Medication(s) given: none Post HD VS: 160/701      Hunter Hacking LPN [facility]

## 2023-12-10 NOTE — Progress Notes (Signed)
 PHARMACY - ANTICOAGULATION CONSULT NOTE  Pharmacy Consult for Heparin   Indication: chest pain/ACS  Allergies  Allergen Reactions   Codeine Nausea And Vomiting   Hydrocodone -Acetaminophen  Nausea And Vomiting    Patient Measurements: Height: 5' 7 (170.2 cm) Weight: 82 kg (180 lb 12.4 oz) IBW/kg (Calculated) : 66.1 HEPARIN  DW (KG): 82  Vital Signs: Temp: 98.2 F (36.8 C) (11/18 0128) Temp Source: Oral (11/18 0128) BP: 139/78 (11/18 0140) Pulse Rate: 104 (11/18 0000)  Labs: Recent Labs    12/09/23 2131 12/09/23 2221 12/09/23 2228 12/09/23 2348  HGB 7.3*  --  8.2*  --   HCT 22.6*  --  24.0*  --   PLT 128*  --   --   --   LABPROT  --  15.3*  --   --   INR  --  1.1  --   --   CREATININE 5.37*  --  5.60*  --   TROPONINIHS 153*  --   --  1,098*    Estimated Creatinine Clearance: 14.2 mL/min (A) (by C-G formula based on SCr of 5.6 mg/dL (H)).   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)    Anxiety    Asthma    Asthma    moderate persistent   Carpal tunnel syndrome    Cellulitis and abscess of trunk    Cholelithiasis 07/13/2014   Chronic headaches    Cigarette smoker 07/11/2017   Stopped 2/022  - referred to start Lung cancer screening 04/2021         Debility, unspecified    Dermatophytosis of the body    Dysrhythmia    history of   Edema    End stage renal disease on dialysis (HCC)    MWF; [facility] in [place] (10/21/2014)   Essential hypertension, benign    Generalized anxiety disorder 05/25/2022   GERD (gastroesophageal reflux disease)    Gout, unspecified    History of renal transplant    HTN (hypertension)    Hypertrophy of prostate without urinary obstruction and other lower urinary tract symptoms (LUTS)    Hypotension, unspecified    Hypoxia 03/21/2023   Impotence of  organic origin    Insomnia, unspecified    Localization-related (focal) (partial) epilepsy and epileptic syndromes with complex partial seizures, without mention of intractable epilepsy    12-15-19- Wife states he has NEVER had a seizure    Lumbago    Memory loss    OSA on CPAP    Other and unspecified hyperlipidemia    controlled /managed per wife    Other chronic nonalcoholic liver disease    Other malaise and fatigue    Other nonspecific abnormal serum enzyme levels    Pain in joint, lower leg    Pain in joint, upper arm    Pneumonia several times   PONV (postoperative nausea and vomiting)    Secondary hyperparathyroidism (of renal origin)    Shortness of breath    Sleep apnea    wears cpap    Tension headache    Unspecified constipation    Unspecified essential hypertension    Unspecified hereditary and idiopathic peripheral neuropathy    Unspecified vitamin D  deficiency     Assessment: 63 y/o M presents to the ED with chest pain. ESRD on HD. Has been undergoing  work up for possible TAVR. High sensitivity troponin is increasing (153>>1098). Starting heparin . Above labs reviewed. PTA meds reviewed.  Goal of Therapy:  Heparin  level 0.3-0.7 units/ml Monitor platelets by anticoagulation protocol: Yes   Plan:  Heparin  4000 units BOLUS Start heparin  drip at 1000 units/hr Heparin  level in 8 hours Daily CBC/Heparin  level Monitor for bleeding  Lynwood Mckusick, PharmD, BCPS Clinical Pharmacist Phone: [phone]

## 2023-12-10 NOTE — Consult Note (Cosign Needed Addendum)
 HEART AND VASCULAR CENTER   MULTIDISCIPLINARY HEART VALVE TEAM  Cardiology Consultation:   Patient ID: Kirk [name] MRN: [id number]; DOB: 03-11-1960  Admit date: 12/09/2023 Date of Consult: 12/10/2023  Primary Care Provider: Leonarda Roxan [name], NP [facility] Cardiologist: Vinie [name] Maxcy, MD  [facility] Electrophysiologist:  None    Patient Profile:   Kirk [name] is a 63 y.o. 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 He was scheduled to see Dr. Lucas in the office on 12/11/23 so TCTS consulted for inpatient evaluation.   History of Present Illness:   PET stress 03/05/23 showed normal perfusion images. Myocardial blood flow reserve is decreased globally and in each coronary distribution, suggestive of severe multivessel CAD vs microvascular disease (though flow reserve can be inaccurate in setting of ESRD). LVEF is moderately reduced (35% at rest, 37% at stress).  He was admitted in 06/2023 for acute hypoxic respiratory failure in the setting of rhinovirus, acute CHF and symptomatic anemia with Hg 6.8. GI work up unremarkable and felt to be 2/2 CKD.   Pt with known NICM. [facility] 10/04/23 showed minimal CAD with mildly elevated LVEDP.  Echo 10/05/23 with EF 25%, G2DD, mod pulm HTN, mod mitral subvalvular calcification with mild-mod MR, mod-severe AS with mean grad 22 mmHg, Vmax 3.11 m/s, AVA 1.04 cm2, DVI 0.27, SVI 35. Having issues with hypotension with hemodialysis as well as chronic chest pain 2/2 severe AS vs microvascular disease. He was referred to structural heart and seen by Dr. Wendel who started the TAVR work up.   TAVR scans completed 11/19/23. Cardiac gated CTA of the  heart revealed anatomical characteristics consistent with aortic stenosis suitable for treatment by transcatheter aortic valve replacement with a 26mm Edwards S3UR without any significant complicating features. CTA of the aorta and iliac vessels demonstrated what appear to be adequate pelvic vascular access to facilitate a transfemoral approach.     Kirk [name] was at his dialysis session yesterday and developed hypotension and chest pain/palpitations. He presented to [facility] ED and troponin noted to elevated 153--> 1098 --> 5953 (has chronically elevated troponin ~45-71). Lactic acid normal. Hg 7.3, plt 128. He was started on IV nitroglycerin . CXR showed infiltrates possibly pulmonary edema and being taken for HD today.   He is seen sitting up in HD. He reports the chest pain from yesterday being horrific. He has had episodes like this in the past but this one seemed to last longer. Still has some pain but better controlled on IV NTG. He lives with his wife and daughter in [place]. He still drives. He had been active doing yard work but cannot do this recently due to progressive dyspnea, fatigue and chest pain. He denies LE edema. He has orthopnea and PND which affects his quality of sleep. He denies exertional dizziness or syncope. He has occasional fluttering in chest. He has no teeth.     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)    Anxiety    Asthma  Asthma    moderate persistent   Carpal tunnel syndrome    Cellulitis and abscess of trunk    Cholelithiasis 07/13/2014   Chronic headaches    Cigarette smoker 07/11/2017   Stopped 2/022  - referred to start Lung cancer screening 04/2021         Debility, unspecified    Dermatophytosis of the body    Dysrhythmia    history of   Edema    End stage renal disease on dialysis [facility])    MWF;  [facility] in [place] (10/21/2014)   Essential hypertension, benign    Generalized anxiety disorder 05/25/2022   GERD (gastroesophageal reflux disease)    Gout, unspecified    History of renal transplant    HTN (hypertension)    Hypertrophy of prostate without urinary obstruction and other lower urinary tract symptoms (LUTS)    Hypotension, unspecified    Hypoxia 03/21/2023   Impotence of organic origin    Insomnia, unspecified    Localization-related (focal) (partial) epilepsy and epileptic syndromes with complex partial seizures, without mention of intractable epilepsy    12-15-19- Wife states he has NEVER had a seizure    Lumbago    Memory loss    OSA on CPAP    Other and unspecified hyperlipidemia    controlled /managed per wife    Other chronic nonalcoholic liver disease    Other malaise and fatigue    Other nonspecific abnormal serum enzyme levels    Pain in joint, lower leg    Pain in joint, upper arm    Pneumonia several times   PONV (postoperative nausea and vomiting)    Secondary hyperparathyroidism (of renal origin)    Shortness of breath    Sleep apnea    wears cpap    Tension headache    Unspecified constipation    Unspecified essential hypertension    Unspecified hereditary and idiopathic peripheral neuropathy    Unspecified vitamin D  deficiency     Past Surgical History:  Procedure Laterality Date   A/V FISTULAGRAM N/A 03/28/2023   Procedure: A/V Fistulagram;  Surgeon: Melia Lynwood ORN, MD;  Location: [facility] INVASIVE CV LAB;  Service: Cardiovascular;  Laterality: N/A;   A/V FISTULAGRAM Left 11/07/2023   Procedure: A/V Fistulagram;  Surgeon: Serene Gaile ORN, MD;  Location: [facility] PV LAB;  Service: Cardiovascular;  Laterality: Left;   AV FISTULA PLACEMENT Left ?2010   forearm; at [facility]   BACK SURGERY     CARDIAC CATHETERIZATION  03/21/2011   CHOLECYSTECTOMY N/A 10/21/2014   Procedure: LAPAROSCOPIC CHOLECYSTECTOMY WITH INTRAOPERATIVE  CHOLANGIOGRAM;  Surgeon: Deward Null III, MD;  Location: [facility] OR;  Service: General;  Laterality: N/A;   COLONOSCOPY     ESOPHAGOGASTRODUODENOSCOPY N/A 06/21/2023   Procedure: EGD (ESOPHAGOGASTRODUODENOSCOPY);  Surgeon: Legrand Victory LITTIE DOUGLAS, MD;  Location: [facility] ENDOSCOPY;  Service: Gastroenterology;  Laterality: N/A;   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   IR ANGIO INTRA EXTRACRAN SEL INTERNAL CAROTID UNI R MOD SED  11/30/2021   IR ANGIOGRAM FOLLOW UP STUDY  11/30/2021   IR AV DIALY SHUNT INTRO NEEDLE/INTRACATH INITIAL W/PTA/IMG LEFT  12/13/2022   IR BONE MARROW BIOPSY & ASPIRATION  03/27/2022   IR NEURO EACH ADD'L AFTER BASIC UNI RIGHT (MS)  11/30/2021   IR TRANSCATH/EMBOLIZ  11/30/2021   IR US  GUIDE VASC ACCESS LEFT  12/13/2022   KIDNEY TRANSPLANT  08/17/2011   [facility]    LAPAROSCOPIC CHOLECYSTECTOMY  10/21/2014   w/IOC  LEFT HEART CATH AND CORONARY ANGIOGRAPHY N/A 04/09/2023   Procedure: LEFT HEART CATH AND CORONARY ANGIOGRAPHY;  Surgeon: Anner Kirk ORN, MD;  Location: [facility] INVASIVE CV LAB;  Service: Cardiovascular;  Laterality: N/A;   LEFT HEART CATH AND CORONARY ANGIOGRAPHY N/A 10/04/2023   Procedure: LEFT HEART CATH AND CORONARY ANGIOGRAPHY;  Surgeon: Anner Kirk ORN, MD;  Location: [facility] INVASIVE CV LAB;  Service: Cardiovascular;  Laterality: N/A;   LEFT HEART CATHETERIZATION WITH CORONARY ANGIOGRAM N/A 03/21/2011   Procedure: LEFT HEART CATHETERIZATION WITH CORONARY ANGIOGRAM;  Surgeon: Vinie KYM Maxcy, MD;  Location: [facility] CATH LAB;  Service: Cardiovascular;  Laterality: N/A;   NEPHRECTOMY  08/2013   removed transplaned kidney   PERIPHERAL VASCULAR BALLOON ANGIOPLASTY Left 03/28/2023   Procedure: PERIPHERAL VASCULAR BALLOON ANGIOPLASTY;  Surgeon: Melia Lynwood ORN, MD;  Location: [facility] INVASIVE CV LAB;  Service: Cardiovascular;  Laterality: Left;  80% outflow cephalic   POLYPECTOMY     POSTERIOR FUSION CERVICAL SPINE  06/25/2012   for spinal stenosis   RADIOLOGY WITH ANESTHESIA N/A 11/30/2021    Procedure: RIGHT MIDDLE MENINGEAL ARTERY EMBOLIZATION;  Surgeon: Radiologist, Medication, MD;  Location: [facility] OR;  Service: Radiology;  Laterality: N/A;   VASECTOMY  2010   VENOUS ANGIOPLASTY  11/07/2023   Procedure: VENOUS ANGIOPLASTY;  Surgeon: Serene Gaile ORN, MD;  Location: [facility] PV LAB;  Service: Cardiovascular;;  cephalic vein     Home Medications:  Prior to Admission medications   Medication Sig Start Date End Date Taking? Authorizing Provider  aspirin  EC 81 MG tablet Take 81 mg by mouth daily.   Yes [provider]  hydrALAZINE  (APRESOLINE ) 50 MG tablet Take 1 tablet (50 mg total) by mouth 3 (three) times daily. 10/16/23 01/14/24 Yes Meng, Hao, PA  acetaminophen  (TYLENOL ) 500 MG tablet Take 1 tablet (500 mg total) by mouth every 6 (six) hours as needed for moderate pain. Patient taking differently: Take 1,000 mg by mouth every 6 (six) hours as needed for moderate pain (pain score 4-6) or headache. 09/12/14   Jadine Toribio SQUIBB, MD  albuterol  (PROVENTIL ) (2.5 MG/3ML) 0.083% nebulizer solution Take 3 mLs (2.5 mg total) by nebulization every 4 (four) hours as needed for wheezing or shortness of breath. 06/25/23   Elgergawy, Brayton RAMAN, MD  albuterol  (VENTOLIN  HFA) 108 (90 Base) MCG/ACT inhaler Inhale 2 puffs into the lungs every 6 (six) hours as needed for wheezing or shortness of breath. 10/29/22   Darlean Ozell NOVAK, MD  Cholecalciferol  (VITAMIN D3) 25 MCG (1000 UT) CAPS Take 4,000 Units by mouth See admin instructions. Patient takes Earlean Everts, Sat, Sun    [provider]  doxazosin  (CARDURA ) 4 MG tablet Take 1 tablet (4 mg total) by mouth at bedtime. 05/31/23   Vannie Reche RAMAN, NP  fluticasone -salmeterol (ADVAIR HFA) 45-21 MCG/ACT inhaler  07/05/23   Darlean Ozell NOVAK, MD  guaiFENesin  (MUCINEX ) 600 MG 12 hr tablet Take 1,200 mg by mouth 2 (two) times daily as needed for cough or to loosen phlegm.    [provider]  ipratropium-albuterol  (DUONEB) 0.5-2.5 (3) MG/3ML SOLN Take 3  mLs by nebulization every 6 (six) hours. Please take every 6 hours scheduled for next 5 days then as needed. 06/25/23   Elgergawy, Brayton RAMAN, MD  isosorbide  mononitrate (IMDUR ) 60 MG 24 hr tablet Take 60 mg by mouth daily. 10/04/22   [provider]  metoprolol  succinate (TOPROL -XL) 25 MG 24 hr tablet Take 1 tablet (25 mg total) by mouth daily. 10/07/23   Pahwani,  Fredia, MD  multivitamin (RENA-VIT) TABS tablet Take 1 tablet by mouth daily. Patient taking differently: Take 1 tablet by mouth See admin instructions. Take 1 tablet by mouth once daily in the afternoon. 01/29/23   Ngetich, Dinah C, NP  oxyCODONE  (ROXICODONE ) 5 MG immediate release tablet Take 1 tablet (5 mg total) by mouth every 4 (four) hours as needed for severe pain (pain score 7-10). 11/15/23   Freddi Hamilton, MD  OXYGEN  Inhale into the lungs. 2 L  cont.    [provider]  pravastatin  (PRAVACHOL ) 40 MG tablet TAKE 1 TABLET BY MOUTH EVERY DAY IN THE EVENING Patient taking differently: Take 40 mg by mouth at bedtime. TAKE 1 TABLET BY MOUTH EVERY DAY IN THE EVENING 12/04/22   Ngetich, Dinah C, NP  sevelamer  carbonate (RENVELA ) 2.4 g PACK Take 4.8 g by mouth 3 (three) times daily with meals. Also takes with his snacks    [provider]  sucroferric oxyhydroxide (VELPHORO ) 500 MG chewable tablet Chew 1,000-1,500 mg by mouth See admin instructions. Take 3 tablets (1500mg ) by mouth three times daily with meals and take 2 tablets (1000mg ) with snacks.    [provider]  traZODone  (DESYREL ) 50 MG tablet TAKE 0.5-1 TABLET (25-50 MG TOTAL) BY MOUTH AT BEDTIME AS NEEDED. FOR SLEEP Patient taking differently: Take 50 mg by mouth at bedtime. 04/08/23   Ngetich, Dinah C, NP  UNABLE TO FIND Inhale 1 Device into the lungs at bedtime. CPAP    [provider]    Inpatient Medications: Scheduled Meds:  aspirin   324 mg Oral Once   aspirin  EC  81 mg Oral Daily   Chlorhexidine  Gluconate Cloth  6 each Topical Q0600    metoprolol  succinate  12.5 mg Oral Daily   pravastatin   40 mg Oral QHS   Continuous Infusions:  anticoagulant sodium citrate      anticoagulant sodium citrate      heparin  1,250 Units/hr (12/10/23 1126)   nitroGLYCERIN      PRN Meds: acetaminophen , alteplase , alteplase , anticoagulant sodium citrate , anticoagulant sodium citrate , fentaNYL  (SUBLIMAZE ) injection, heparin , heparin , lidocaine  (PF), lidocaine -prilocaine , melatonin, pentafluoroprop-tetrafluoroeth, polyethylene glycol, prochlorperazine   Allergies:    Allergies  Allergen Reactions   Codeine Nausea And Vomiting   Lortab [Hydrocodone -Acetaminophen ] Nausea And Vomiting    Social History:   Social History   Socioeconomic History   Marital status: Married    Spouse name: Edna   Number of children: 3   Years of education: Not on file   Highest education level: Not on file  Occupational History   Occupation: disabled    Employer: DISABLED  Tobacco Use   Smoking status: Former    Current packs/day: 0.00    Average packs/day: 0.5 packs/day for 32.0 years (16.0 ttl pk-yrs)    Types: Cigarettes    Start date: 78    Quit date: 02/24/2020    Years since quitting: 3.7   Smokeless tobacco: Never  Vaping Use   Vaping status: Never Used  Substance and Sexual Activity   Alcohol use: No    Alcohol/week: 0.0 standard drinks of alcohol   Drug use: No   Sexual activity: Yes  Other Topics Concern   Not on file  Social History Narrative   Drinks 1 cup of caffeine daily.   Social Drivers of Health   Financial Resource Strain: Medium Risk (11/02/2021)   Overall Financial Resource Strain (CARDIA)    Difficulty of Paying Living Expenses: Somewhat hard  Food Insecurity: No Food Insecurity (06/20/2023)  Hunger Vital Sign    Worried About Running Out of Food in the Last Year: Never true    Ran Out of Food in the Last Year: Never true  Transportation Needs: No Transportation Needs (06/20/2023)   PRAPARE - Doctor, General Practice (Medical): No    Lack of Transportation (Non-Medical): No  Physical Activity: Insufficiently Active (06/25/2017)   Exercise Vital Sign    Days of Exercise per Week: 3 days    Minutes of Exercise per Session: 20 min  Stress: No Stress Concern Present (06/25/2017)   [employer] of Occupational Health - Occupational Stress Questionnaire    Feeling of Stress : Only a little  Social Connections: Patient Declined (03/17/2023)   Social Connection and Isolation Panel    Frequency of Communication with Friends and Family: Patient declined    Frequency of Social Gatherings with Friends and Family: Patient declined    Attends Religious Services: Patient declined    Database Administrator or Organizations: Patient declined    Attends Banker Meetings: Patient declined    Marital Status: Patient declined  Intimate Partner Violence: Not At Risk (06/20/2023)   Humiliation, Afraid, Rape, and Kick questionnaire    Fear of Current or Ex-Partner: No    Emotionally Abused: No    Physically Abused: No    Sexually Abused: No    Family History:   Family History  Adopted: Yes  Problem Relation Age of Onset   Colon cancer Neg Hx    Esophageal cancer Neg Hx    Rectal cancer Neg Hx    Stomach cancer Neg Hx    Colon polyps Neg Hx      ROS:  Please see the history of present illness.  All other ROS reviewed and negative.     Physical Exam/Data:   Vitals:   12/10/23 1300 12/10/23 1330 12/10/23 1400 12/10/23 1430  BP:  (!) 174/82 (!) 172/88 (!) 174/84  Pulse: (!) 106 (!) 105 (!) 108 (!) 107  Resp: (!) 22 (!) 31 18 19   Temp:      TempSrc:      SpO2: 100% 100% 100% 100%  Weight:      Height:       No intake or output data in the 24 hours ending 12/10/23 1455    12/10/2023   12:05 PM 12/09/2023    9:24 PM 12/05/2023   10:41 AM  Last 3 Weights  Weight (lbs) 191 lb 12.8 oz 180 lb 12.4 oz 181 lb 11.2 oz  Weight (kg) 87 kg 82 kg 82.419 kg     Body mass index is  30.04 kg/m.  General:  chronically ill appearing HEENT: normal Lymph: no adenopathy Neck: + JVD Cardiac:  normal S1, S2; RRR; 2/6 harsh SEM heard best at RUSB.  Lungs: decreased breath sounds at bases.  Abd: soft, nontender, no hepatomegaly  Ext: no edema Musculoskeletal:  No deformities, BUE and BLE strength normal and equal Skin: warm and dry  Neuro:  CNs 2-12 intact, no focal abnormalities noted Psych:  Normal affect   EKG:  The EKG was personally reviewed and demonstrates:  sinus tachy HR 101 Telemetry:  Telemetry was personally reviewed and demonstrates:  sinus tachy with rare PVC. HRs low 100s  Cardiac Studies & Procedures   ______________________________________________________________________________________________ CARDIAC CATHETERIZATION  CARDIAC CATHETERIZATION 10/04/2023  Conclusion Table formatting from the original result was not included. Images from the original result were not included. Dominance: Right  LV Gram (2 - Hypokinesis); EF ~35-40%   Angiographically minimal CAD with no change from prior catheterization. Moderate-severely reduced LVEF (estimated 30 to 35% with global hypokinesis) with EDP of 16 to 18 mmHg. => Nonischemic Cardiomyopathy as previously indicated   RECOMMENDATIONS   Anticipated discharge date to be determined.   The patient will be followed by cardiology but will likely need to be comanaged by nephrology and internal medicine. Will need aggressive blood pressure management.   No indication for antiplatelet therapy at this time .     Kirk Clay, MD  Findings Coronary Findings Diagnostic  Dominance: Right  Left Main Vessel is large. Vessel is angiographically normal.  Left Anterior Descending Vessel is large. The vessel exhibits minimal luminal irregularities.  First Diagonal Branch Vessel is small in size.  Second Diagonal Branch Vessel is  large in size.  Third Diagonal Branch Vessel is small in size.  Left Circumflex Vessel is large. The vessel exhibits minimal luminal irregularities.  First Obtuse Marginal Branch Vessel is small in size.  First Left Posterolateral Branch Vessel is small in size.  Left Posterior Atrioventricular Artery Vessel is small in size.  Right Coronary Artery Vessel is large. Vessel is angiographically normal.  Acute Marginal Branch Vessel is small in size.  Right Ventricular Branch Vessel is small in size.  Right Posterior Descending Artery Vessel is large in size.  Right Posterior Atrioventricular Artery Vessel is large in size.  Third Right Posterolateral Branch Vessel is large in size.  Intervention  No interventions have been documented.   CARDIAC CATHETERIZATION  CARDIAC CATHETERIZATION 04/09/2023  Conclusion Table formatting from the original result was not included. Images from the original result were not included. Dominance: Right   Angiographically Minimal CAD with Right Dominantn System Normal LVEDP with ~10-15 mmHg AoV Gradient  RECOMMENDATIONS Stable for discharge after sheath removal Follow-up with primary cardiologist to evaluate for nonischemic cardiomyopathy.  There is possibility of microvascular ischemia, but TIMI-3 flow in the macrovasculature   Kirk Clay, MD  Findings Coronary Findings Diagnostic  Dominance: Right  Left Main Vessel was injected. Vessel is large. Vessel is angiographically normal.  Left Anterior Descending Vessel is large. The vessel exhibits minimal luminal irregularities.  First Diagonal Branch Vessel is small in size.  Second Diagonal Branch Vessel is large in size.  Third Diagonal Branch Vessel is small in size.  Left Circumflex Vessel is large.  First Obtuse Marginal Branch Vessel is small in size.  First Left Posterolateral Branch Vessel is small in size.  Left Posterior Atrioventricular  Artery Vessel is small in size.  Right Coronary Artery Vessel was injected. Vessel is large. Vessel is angiographically normal.  Acute Marginal Branch Vessel is small in size.  Right Ventricular Branch Vessel is small in size.  Right Posterior Descending Artery Vessel is large in size.  Right Posterior Atrioventricular Artery Vessel is large in size.  Third Right Posterolateral Branch Vessel is large in size.  Intervention  No interventions have been documented.   STRESS TESTS  NM PET CT CARDIAC PERFUSION MULTI W/ABSOLUTE BLOODFLOW 03/05/2023  Narrative   Normal perfusion images.  Myocardial blood flow reserve is decreased globally and in each coronary distribution, suggestive of severe multivessel CAD vs microvascular disease (though flow reserve can be inaccurate in setting of ESRD).  LVEF is moderately reduced (35% at rest, 37% at stress).  Severe coronary calcifications on CT.  No high risk findings such as TID or drop in EF with stress.  Overall, study is  intermediate risk   LV perfusion is normal. There is no evidence of ischemia. There is no evidence of infarction.   Rest left ventricular function is abnormal. Rest global function is moderately reduced. Rest EF: 35%. Stress left ventricular function is abnormal. Stress global function is moderately reduced. Stress EF: 37%. End diastolic cavity size is moderately enlarged. End systolic cavity size is moderately enlarged.   Myocardial blood flow was computed to be 0.50ml/g/min at rest and 1.55ml/g/min at stress. Global myocardial blood flow reserve was 1.67 and was abnormal.   Coronary calcium  was present on the attenuation correction CT images. Severe coronary calcifications were present. Coronary calcifications were present in the left anterior descending artery, left circumflex artery and right coronary artery distribution(s).   The study is intermediate risk.   Electronically signed by Lonni Nanas, MD  CLINICAL  DATA:  This over-read does not include interpretation of cardiac or coronary anatomy or pathology. The Cardiac PET CT interpretation by the cardiologist is attached.  COMPARISON:  CT chest angiogram, 01/08/2023  FINDINGS: Cardiovascular: Aortic atherosclerosis. Dense aortic valve calcifications. Cardiomegaly. Three-vessel coronary artery calcifications. Enlargement of the main pulmonary artery measuring up to 3.9 cm in caliber. No pericardial effusion.  Limited Mediastinum/Nodes: No enlarged mediastinal, hilar, or axillary lymph nodes. Trachea and esophagus demonstrate no significant findings.  Limited Lungs/Pleura: Small bilateral pleural effusions. Interlobular septal thickening throughout the lung bases. Mosaic attenuation of the airspaces.  Upper Abdomen: No acute abnormality.  Musculoskeletal: No chest wall abnormality. No acute osseous findings.  IMPRESSION: 1. Small bilateral pleural effusions and interlobular septal thickening throughout the lung bases, consistent with pulmonary edema. 2. Mosaic attenuation of the airspaces, consistent with small airways disease. 3. Cardiomegaly and coronary artery disease. 4. Enlargement of the main pulmonary artery, as can be seen in pulmonary hypertension. 5. Dense aortic valve calcifications. Correlate for echocardiographic evidence of aortic valve dysfunction.  Aortic Atherosclerosis (ICD10-I70.0).   Electronically Signed By: Marolyn JONETTA Jaksch M.D. On: 03/05/2023 14:20   ECHOCARDIOGRAM  ECHOCARDIOGRAM COMPLETE 10/05/2023  Narrative ECHOCARDIOGRAM REPORT    Patient Name:   Kirk [name] Date of Exam: 10/05/2023 Medical Rec #:  [id number]       Height:       67.0 in Accession #:    [id number]      Weight:       185.4 lb Date of Birth:  19-Jan-1961        BSA:          1.958 m Patient Age:    63 years        BP:           100/60 mmHg Patient Gender: M               HR:           74 bpm. Exam Location:   Inpatient  Procedure: 2D Echo, Cardiac Doppler and Color Doppler (Both Spectral and Color Flow Doppler were utilized during procedure).  Indications:    Nonischemic Cardiomyopathy I42.8  History:        Patient has no prior history of Echocardiogram examinations, most recent 03/18/2023. CHF and Cardiomyopathy, COPD and ESRD, Arrythmias:Atrial Fibrillation, Signs/Symptoms:Hypotension and Chest Pain; Risk Factors:Hypertension, Sleep Apnea, Diabetes and Dyslipidemia.  Sonographer:    Thea Norlander RCS Referring Phys: Kirk ORN HARDING  IMPRESSIONS   1. Left ventricular ejection fraction, by estimation, is 25 to 30%. The left ventricle has severely decreased function. The left ventricle demonstrates global hypokinesis. Left ventricular diastolic  parameters are consistent with Grade II diastolic dysfunction (pseudonormalization). Elevated left atrial pressure. The E/e' is 19.5. 2. Right ventricular systolic function is normal. The right ventricular size is normal. There is moderately elevated pulmonary artery systolic pressure. The estimated right ventricular systolic pressure is 45.5 mmHg. 3. Left atrial size was moderately dilated. 4. Right atrial size was moderately dilated. 5. Moderate mitral subvalvular calcification. 6. The mitral valve is degenerative. Mild to moderate mitral valve regurgitation. 7. The aortic valve was not well visualized. Aortic valve regurgitation is not visualized. Moderate to severe aortic valve stenosis (peak velocity 3.84m/s, MG , AVA VTI 1.04cm2, SVi 35, DI 0.27) consider severe Low flow low gradient AS. 8. The inferior vena cava is dilated in size with <50% respiratory variability, suggesting right atrial pressure of 15 mmHg.  Comparison(s): A prior study was performed on 03/18/2023. LVEF 45-50%, global hypokinesis, G2DD, RVSP 59.20mmHg, moderate LAE, mild RAE, mild to moderate AS, RAP .  FINDINGS Left Ventricle: Left ventricular ejection  fraction, by estimation, is 25 to 30%. The left ventricle has severely decreased function. The left ventricle demonstrates global hypokinesis. The left ventricular internal cavity size was normal in size. There is borderline left ventricular hypertrophy. Left ventricular diastolic parameters are consistent with Grade II diastolic dysfunction (pseudonormalization). Elevated left atrial pressure. The E/e' is 19.5.  Right Ventricle: The right ventricular size is normal. No increase in right ventricular wall thickness. Right ventricular systolic function is normal. There is moderately elevated pulmonary artery systolic pressure. The tricuspid regurgitant velocity is 2.76 m/s, and with an assumed right atrial pressure of 15 mmHg, the estimated right ventricular systolic pressure is 45.5 mmHg.  Left Atrium: Left atrial size was moderately dilated.  Right Atrium: Right atrial size was moderately dilated.  Pericardium: There is no evidence of pericardial effusion.  Mitral Valve: The mitral valve is degenerative in appearance. Mild to moderate mitral annular calcification. Moderate mitral subvalvular calcification. Mild to moderate mitral valve regurgitation.  Tricuspid Valve: The tricuspid valve is not well visualized. Tricuspid valve regurgitation is trivial. No evidence of tricuspid stenosis.  Aortic Valve: The aortic valve was not well visualized. Aortic valve regurgitation is not visualized. Moderate to severe aortic valve stenosis (peak velocity 3.12m/s, MG , AVA VTI 1.04cm2, SVi 35, DI 0.27) consider severe Low flow low gradient AS. Aortic valve mean gradient measures 22.0 mmHg. Aortic valve peak gradient measures 38.7 mmHg. Aortic valve area, by VTI measures 1.04 cm.  Pulmonic Valve: The pulmonic valve was normal in structure. Pulmonic valve regurgitation is not visualized. No evidence of pulmonic stenosis.  Aorta: The aortic root and ascending aorta are structurally normal, with no  evidence of dilitation.  Venous: The inferior vena cava is dilated in size with less than 50% respiratory variability, suggesting right atrial pressure of 15 mmHg.  IAS/Shunts: The atrial septum is grossly normal.   LEFT VENTRICLE PLAX 2D LVIDd:         5.60 cm   Diastology LVIDs:         4.30 cm   LV e' medial:    5.00 cm/s LV PW:         1.30 cm   LV E/e' medial:  21.6 LV IVS:        1.10 cm   LV e' lateral:   6.53 cm/s LVOT diam:     2.20 cm   LV E/e' lateral: 16.5 LV SV:         69 LV SV Index:   35 LVOT  Area:     3.80 cm   RIGHT VENTRICLE             IVC RV S prime:     14.00 cm/s  IVC diam: 2.80 cm TAPSE (M-mode): 2.4 cm  LEFT ATRIUM             Index        RIGHT ATRIUM           Index LA diam:        4.30 cm 2.20 cm/m   RA Area:     26.90 cm LA Vol (A2C):   88.3 ml 45.09 ml/m  RA Volume:   93.30 ml  47.65 ml/m LA Vol (A4C):   77.6 ml 39.63 ml/m LA Biplane Vol: 83.4 ml 42.59 ml/m AORTIC VALVE AV Area (Vmax):    1.11 cm AV Area (Vmean):   1.16 cm AV Area (VTI):     1.04 cm AV Vmax:           311.00 cm/s AV Vmean:          204.333 cm/s AV VTI:            0.661 m AV Peak Grad:      38.7 mmHg AV Mean Grad:      22.0 mmHg LVOT Vmax:         91.20 cm/s LVOT Vmean:        62.533 cm/s LVOT VTI:          0.181 m LVOT/AV VTI ratio: 0.27  AORTA Ao Root diam: 3.10 cm Ao Asc diam:  3.30 cm  MITRAL VALVE                TRICUSPID VALVE MV Area (PHT): 4.49 cm     TR Peak grad:   30.5 mmHg MV Decel Time: 169 msec     TR Vmax:        276.00 cm/s MV E velocity: 108.00 cm/s MV A velocity: 83.30 cm/s   SHUNTS MV E/A ratio:  1.30         Systemic VTI:  0.18 m Systemic Diam: 2.20 cm  Sunit Tolia Electronically signed by Madonna Large Signature Date/Time: 10/05/2023/3:40:31 PM    Final      CT SCANS  CT CORONARY MORPH W/CTA COR W/SCORE 11/19/2023  Addendum 11/20/2023 11:36 PM ADDENDUM REPORT: 11/20/2023 23:34  EXAM: OVER-READ INTERPRETATION  CT  CHEST  The following report is an over-read performed by radiologist Dr. Franky Leff [facility] on 11/20/2023. This over-read does not include interpretation of cardiac or coronary anatomy or pathology. The coronary CTA interpretation by the cardiologist is attached.  COMPARISON:  None.  FINDINGS: Heart is normal size. Aorta normal caliber. Extensive aortic atherosclerosis. No adenopathy. Tree-in-bud nodular densities again noted in the lower lobes bilaterally likely reflecting chronic infectious/inflammatory process. No effusions. No acute findings in the upper abdomen. Chest wall soft tissues are unremarkable. No acute bony abnormality.  IMPRESSION: Diffuse aortic atherosclerosis.  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   Electronically Signed By: Franky Crease M.D. On: 11/20/2023 23:34  Narrative CLINICAL DATA:  85M with severe aortic stenosis being evaluated for a TAVR procedure.  EXAM: Cardiac TAVR CT  TECHNIQUE: A non-contrast, gated CT scan was obtained with axial slices of 2.5 mm through the heart for aortic valve scoring. A 120 kV retrospective, gated, contrast cardiac scan was obtained. Gantry rotation speed was 230  msec and collimation was 0.63 mm. Nitroglycerin  was not given. A delayed scan was obtained to exclude left atrial appendage thrombus. The 3D dataset was reconstructed in systole with motion correction. The 3D data set was reconstructed in 5% intervals of the 0-95% of the R-R cycle. Systolic and diastolic phases were analyzed on a dedicated workstation using MPR, MIP, and VRT modes. The patient received 100 cc of contrast.  FINDINGS: Aortic Root:  Aortic valve: trileaflet  Aortic valve calcium  score: 1221  Aortic annulus:  Diameter: 28mm x 24mm  Perimeter: 82mm  Area: 554mm^2  Calcifications: No calcifications  Coronary height:  Min Left - 15mm; Min Right - 17mm  Sinotubular height: Left cusp - 23mm; Right cusp - 24mm; Noncoronary cusp - 24mm  LVOT (as measured 3 mm below the annulus):  Diameter: 31mm x 24mm  Area: 533mm^2  Calcifications: Mild calcification adjacent noncoronary cusp  Aortic sinus width: Left cusp - 34mm; Right cusp - 30mm; Noncoronary cusp - 32mm  Sinotubular junction width: 31mm x 29mm  Optimum Fluoroscopic Angle for Delivery: LAO 31 CRA 3  Cardiac:  Right atrium: Normal size  Right ventricle: Normal size  Pulmonary arteries: Dilated main pulmonary artery measuring 33mm  Pulmonary veins: Normal configuration  Left atrium: Mild enlargement  Left ventricle: Mild dilatation  Pericardium: Normal thickness  Coronary arteries: Coronary calcium  score 1959 (99th percentile)  IMPRESSION: 1. Tricuspid aortic valve with moderate calcifications (AV calcium  score 1221)  2. Aortic annulus measures 28mm x 24mm in diameter with perimeter 82mm and area 524 mm^2. Mild LVOT calcifications adjacent to noncoronary cusp. Annular measurements are suitable for delivery of a 26mm Edwards Sapien 3 valve  3.  Sufficient coronary to annulus distance.  4.  Optimum Fluoroscopic Angle for Delivery:  LAO 31 CRA 3  5.  Coronary calcium  score 1959 (99th percentile)  Electronically Signed: By: Lonni Nanas M.D. On: 11/20/2023 12:53     ______________________________________________________________________________________________      Laboratory Data:  High Sensitivity Troponin:   Recent Labs  Lab 11/15/23 1600 11/15/23 1759 12/09/23 2131 12/09/23 2348 12/10/23 1004  TROPONINIHS 45* 49* 153* 1,098* 5,953*     Chemistry Recent Labs  Lab 12/09/23 2131 12/09/23 2228 12/10/23 1004  NA 135 136 136  K 3.4* 3.7 4.8  CL 92* 93* 93*  CO2 28  --  25  GLUCOSE 136* 113* 121*  BUN 13 15 20   CREATININE 5.37* 5.60* 6.47*  CALCIUM  8.2*  --  8.3*  GFRNONAA 11*  --  9*  ANIONGAP  15  --  18*    Recent Labs  Lab 12/10/23 1004  ALBUMIN  2.8*   Hematology Recent Labs  Lab 12/09/23 2131 12/09/23 2228 12/10/23 1004  WBC 5.4  --  5.3  RBC 2.33*  --  2.56*  HGB 7.3* 8.2* 7.9*  HCT 22.6* 24.0* 25.5*  MCV 97.0  --  99.6  MCH 31.3  --  30.9  MCHC 32.3  --  31.0  RDW 15.6*  --  15.9*  PLT 128*  --  130*   BNP Recent Labs  Lab 12/09/23 2221  BNP 2,729.6*    DDimer No results for input(s): DDIMER in the last 168 hours.   Radiology/Studies:  CT Head Wo Contrast Result Date: 12/09/2023 EXAM: CT HEAD WITHOUT CONTRAST 12/09/2023 11:08:09 PM TECHNIQUE: CT of the head was performed without the administration of intravenous contrast. Automated exposure control, iterative reconstruction, and/or weight based adjustment of the mA/kV was utilized to reduce the radiation dose  to as low as reasonably achievable. COMPARISON: CT head 03/16/2023. CLINICAL HISTORY: c/f poss meningitis. FINDINGS: BRAIN AND VENTRICLES: No acute hemorrhage. No evidence of acute infarct. No hydrocephalus. No extra-axial collection. No mass effect or midline shift. Atherosclerotic calcifications are present within the cavernous internal carotid and vertebral arteries. ORBITS: No acute abnormality. SINUSES: No acute abnormality. SOFT TISSUES AND SKULL: No acute soft tissue abnormality. Surgical changes along the right temporal calvarium. No skull fracture. IMPRESSION: 1. No acute intracranial abnormality. Electronically signed by: Morgane Naveau MD 12/09/2023 11:19 PM EST RP Workstation: [id number]   DG Chest Port 1 View Result Date: 12/09/2023 CLINICAL DATA:  Questionable sepsis EXAM: PORTABLE CHEST 1 VIEW COMPARISON:  Chest x-ray 11/15/2023 FINDINGS: Heart is enlarged. There central patchy opacities in both lungs. There is no pleural effusion or pneumothorax. No acute fractures are seen. IMPRESSION: Cardiomegaly with central patchy opacities in both lungs, which may represent pulmonary edema or  infection. Electronically Signed   By: Greig Pique M.D.   On: 12/09/2023 23:15    Procedure Type: Isolated AVR Perioperative OutcomeEstimate % Operative Mortality 5.33% Morbidity & Mortality 17.5% Stroke 1.81% Renal Failure NA Reoperation 6.04% Prolonged Ventilation 11% Deep Sternal Wound Infection 0.098% Long Hospital Stay (>14 days) 9.4% Short Hospital Stay (<6 days)* 27.1%     Assessment and Plan:   Kirk [name] is a 63 y.o. male with symptoms of severe, stage D2 aortic stenosis with NYHA Class III symptoms currently admitted for acute chest pain and inability to tolerate HD. Found to have NSTEMI with HStrop 153--> 1098 --> 5953 felt to be demand in the setting of microvascular disease, acute CHF and severe AS. Chest pain now much better controlled on IV NTG. Felt to be volume overloaded and currently being dialyzed.   [facility] 10/04/23 showed minimal CAD with mildly elevated LVEDP.    Echo 10/05/23 with EF 25%, G2DD, mod pulm HTN, mod mitral subvalvular calcification with mild-mod MR, mod-severe AS with mean grad 22 mmHg, Vmax 3.11 m/s, AVA 1.04 cm2, DVI 0.27, SVI 35.  TAVR scans 11/19/23. Cardiac gated CTA of the heart revealed anatomical characteristics consistent with aortic stenosis suitable for treatment by transcatheter aortic valve replacement with a 26mm Edwards S3UR without any significant complicating features. CTA of the aorta and iliac vessels demonstrated what appear to be adequate pelvic vascular access to facilitate a transfemoral approach.     I have reviewed the natural history of aortic stenosis with the patient. We have discussed the limitations of medical therapy and the poor prognosis associated with symptomatic aortic stenosis. We have reviewed potential treatment options, including palliative medical therapy, conventional surgical aortic valve replacement, and transcatheter aortic valve replacement. We discussed treatment options in the context of this patient's  specific comorbid medical conditions.    The patient's predicted risk of mortality with conventional aortic valve replacement is 5.33% primarily based on ESRD on HD, LV dysfunction, HFrEF, COPD, anemia. Other significant comorbid conditions include difficulty getting to appts. He would not be a candidate for surgical AVR under any circumstances given above co morbidities. He is interested in pursuing TAVR to improve quality of life and hopefully tolerate HD better.   Plan was to proceed with TAVR but pt noted to have positive blood cultures with Strep mitis/oralis.  He will need to be treated with Abx and cleared. Recommend TEE and ID consult. He will need appropriate treatment and clearance from ID as an outpatient.    Lamarr Hummer PA-C  [facility]   ADDENDUM:  Patient seen and evaluated at bedside.  Continues to have ongoing intermittent chest pain despite nitroglycerin  gtt at 10.  He says he's been having chest pain with dialysis for about 2 weeks now.  Previously was not having issues.  Has a lot of cardiac history including LFLG aortic stenosis.  He is independent in his ADLs, drives and walks unassisted.  He lives with his wife (who is severely debilitated) and daughter (who is functional and helps out around the house).  Unfortunately he now has strep mitis bacteremia so cannot undergo TAVR.  Recommend ID consult and endocarditis work up.  He will need to clear his cultures and full course of antibiotics before proceeding with TAVR.  Con Clunes, MD Cardiothoracic Surgery Pager: [phone]

## 2023-12-10 NOTE — Plan of Care (Signed)
 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 He had dialysis earlier in the day and noted to have low blood pressures and had about 2 L removed. He had sudden onset substernal nonradiating chest pain following dialysis along with palpitations.  Physical Exam Vitals and nursing note reviewed.  Constitutional:      Appearance: Normal appearance.     Comments: Dry heaving  HENT:     Head: Normocephalic and atraumatic.  Eyes:     Conjunctiva/sclera: Conjunctivae normal.  Cardiovascular:     Rate and Rhythm: Normal rate and regular rhythm.     Heart sounds: Murmur heard.  Pulmonary:     Effort: Pulmonary effort is normal.     Breath sounds: Rales present.  Musculoskeletal:     Right lower leg: No edema.     Left lower leg: No edema.  Skin:    Coloration: Skin is not jaundiced or pale.  Neurological:     Mental Status: He is alert.    A/P: I agree with the cardiology fellow plan from early this morning and otherwise as outlined below:   Elevated troponin, likely demand ischemia in the setting of ESRD and severe low flow gradient aortic stenosis Echo pending Minimal CAD on LHC 10/04/23 Severe low-flow low gradient aortic stenosis Undergoing outpatient workup for AVR. Has seen Dr. Wendel on 10/14  Has an appointment with Dr. Lucas [facility]) tomorrow, however he does not have a ride to see them tomorrow so is not going to be able to make it to the appointment. Will discuss with [facility] to see if they would be able to see him while he is here ESRD secondary to Alport  syndrome s/p kidney transplant on HD (MWF) - nephrology consult HFrEF - he received IV fluids and is becoming volume overloaded. Would recommend stopping IV fluids and discussion with nephrology for potential slow volume removal NICM Dry heaving - has PRN compazine  Paroxysmal atrial fibrillation not on anticoagulation  Thank you, Emeline Calender, DO

## 2023-12-10 NOTE — Procedures (Signed)
 S: pt seen in HD unit. Flat-rate IV ntg drip during HD, then can switch back to titrating NTG post HD.    Vitals:   12/10/23 0700 12/10/23 0715 12/10/23 0724 12/10/23 0930  BP: (!) 143/87 (!) 154/86  (!) 147/86  Pulse:    (!) 104  Resp: 20 (!) 24  17  Temp:   98.3 F (36.8 C)   TempSrc:   Oral   SpO2:    100%  Weight:      Height:        Recent Labs  Lab 12/09/23 2131 12/09/23 2228 12/10/23 1004  HGB 7.3* 8.2* 7.9*  ALBUMIN   --   --  2.8*  CALCIUM  8.2*  --  8.3*  PHOS  --   --  4.4  CREATININE 5.37* 5.60* 6.47*  K 3.4* 3.7 4.8    Inpatient medications:  aspirin   324 mg Oral Once   aspirin  EC  81 mg Oral Daily   Chlorhexidine  Gluconate Cloth  6 each Topical Q0600   heparin   2,500 Units Dialysis Once in dialysis   metoprolol  succinate  12.5 mg Oral Daily   pravastatin   40 mg Oral QHS    anticoagulant sodium citrate      anticoagulant sodium citrate      heparin  1,250 Units/hr (12/10/23 1126)   nitroGLYCERIN  15 mcg/min (12/10/23 1133)   nitroGLYCERIN      acetaminophen , alteplase , alteplase , anticoagulant sodium citrate , anticoagulant sodium citrate , fentaNYL  (SUBLIMAZE ) injection, heparin , heparin , lidocaine  (PF), lidocaine -prilocaine , melatonin, pentafluoroprop-tetrafluoroeth, polyethylene glycol, prochlorperazine   I was present at the procedure, reviewed the HD regimen and made appropriate changes.   Myer Fret MD  [facility] 12/10/2023, 12:00 PM

## 2023-12-10 NOTE — Progress Notes (Signed)
 PHARMACY - PHYSICIAN COMMUNICATION CRITICAL VALUE ALERT - BLOOD CULTURE IDENTIFICATION (BCID)  Kirk [name] is an 63 y.o. male who presented to [facility] on 12/09/2023 with a chief complaint of chest pain  Assessment:  ESRD patient admitted for chest pain, temperature on admission 100.2.  Received Cefepime  2g x 1, metronidazole  500mg  IV x 1, and vancomycin  1750mg  IV x 1 yesterday evening.  Name of physician (or Provider) Contacted: Dr. Vernon  Current antibiotics: No antibiotics scheduled  Changes to prescribed antibiotics recommended: Start ceftriaxone  2g IV q24h Recommendations accepted by provider  Results for orders placed or performed during the hospital encounter of 12/09/23  Blood Culture ID Panel (Reflexed) (Collected: 12/09/2023 10:21 PM)  Result Value Ref Range   Enterococcus faecalis NOT DETECTED NOT DETECTED   Enterococcus Faecium NOT DETECTED NOT DETECTED   Listeria monocytogenes NOT DETECTED NOT DETECTED   Staphylococcus species NOT DETECTED NOT DETECTED   Staphylococcus aureus (BCID) NOT DETECTED NOT DETECTED   Staphylococcus epidermidis NOT DETECTED NOT DETECTED   Staphylococcus lugdunensis NOT DETECTED NOT DETECTED   Streptococcus species DETECTED (A) NOT DETECTED   Streptococcus agalactiae NOT DETECTED NOT DETECTED   Streptococcus pneumoniae NOT DETECTED NOT DETECTED   Streptococcus pyogenes NOT DETECTED NOT DETECTED   A.calcoaceticus-baumannii NOT DETECTED NOT DETECTED   Bacteroides fragilis NOT DETECTED NOT DETECTED   Enterobacterales NOT DETECTED NOT DETECTED   Enterobacter cloacae complex NOT DETECTED NOT DETECTED   Escherichia coli NOT DETECTED NOT DETECTED   Klebsiella aerogenes NOT DETECTED NOT DETECTED   Klebsiella oxytoca NOT DETECTED NOT DETECTED   Klebsiella pneumoniae NOT DETECTED NOT DETECTED   Proteus species NOT DETECTED NOT DETECTED   Salmonella species NOT DETECTED NOT DETECTED   Serratia marcescens NOT DETECTED NOT DETECTED    Haemophilus influenzae NOT DETECTED NOT DETECTED   Neisseria meningitidis NOT DETECTED NOT DETECTED   Pseudomonas aeruginosa NOT DETECTED NOT DETECTED   Stenotrophomonas maltophilia NOT DETECTED NOT DETECTED   Candida albicans NOT DETECTED NOT DETECTED   Candida auris NOT DETECTED NOT DETECTED   Candida glabrata NOT DETECTED NOT DETECTED   Candida krusei NOT DETECTED NOT DETECTED   Candida parapsilosis NOT DETECTED NOT DETECTED   Candida tropicalis NOT DETECTED NOT DETECTED   Cryptococcus neoformans/gattii NOT DETECTED NOT DETECTED    [name] 12/10/2023  2:54 PM

## 2023-12-10 NOTE — Progress Notes (Addendum)
 PHARMACY - ANTICOAGULATION CONSULT NOTE  Pharmacy Consult for Heparin   Indication: chest pain/ACS  Allergies  Allergen Reactions   Codeine Nausea And Vomiting   Hydrocodone -Acetaminophen  Nausea And Vomiting    Patient Measurements: Height: 5' 7 (170.2 cm) Weight: 82 kg (180 lb 12.4 oz) IBW/kg (Calculated) : 66.1 HEPARIN  DW (KG): 82  Vital Signs: Temp: 98.3 F (36.8 C) (11/18 0724) Temp Source: Oral (11/18 0724) BP: 147/86 (11/18 0930) Pulse Rate: 104 (11/18 0930)  Labs: Recent Labs    12/09/23 2131 12/09/23 2221 12/09/23 2228 12/09/23 2348 12/10/23 1004  HGB 7.3*  --  8.2*  --  7.9*  HCT 22.6*  --  24.0*  --  25.5*  PLT 128*  --   --   --  130*  LABPROT  --  15.3*  --   --   --   INR  --  1.1  --   --   --   HEPARINUNFRC  --   --   --   --  0.10*  CREATININE 5.37*  --  5.60*  --   --   TROPONINIHS 153*  --   --  1,098*  --     Estimated Creatinine Clearance: 14.2 mL/min (A) (by C-G formula based on SCr of 5.6 mg/dL (H)).   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)    Anxiety    Asthma    Asthma    moderate persistent   Carpal tunnel syndrome    Cellulitis and abscess of trunk    Cholelithiasis 07/13/2014   Chronic headaches    Cigarette smoker 07/11/2017   Stopped 2/022  - referred to start Lung cancer screening 04/2021         Debility, unspecified    Dermatophytosis of the body    Dysrhythmia    history of   Edema    End stage renal disease on dialysis (HCC)    MWF; [facility] in [place] (10/21/2014)   Essential hypertension, benign    Generalized anxiety disorder 05/25/2022   GERD (gastroesophageal reflux disease)    Gout, unspecified    History of renal transplant    HTN (hypertension)    Hypertrophy of prostate without urinary obstruction and other lower urinary  tract symptoms (LUTS)    Hypotension, unspecified    Hypoxia 03/21/2023   Impotence of organic origin    Insomnia, unspecified    Localization-related (focal) (partial) epilepsy and epileptic syndromes with complex partial seizures, without mention of intractable epilepsy    12-15-19- Wife states he has NEVER had a seizure    Lumbago    Memory loss    OSA on CPAP    Other and unspecified hyperlipidemia    controlled /managed per wife    Other chronic nonalcoholic liver disease    Other malaise and fatigue    Other nonspecific abnormal serum enzyme levels    Pain in joint, lower leg    Pain in joint, upper arm    Pneumonia several times   PONV (postoperative nausea and vomiting)    Secondary hyperparathyroidism (of renal origin)    Shortness of breath    Sleep apnea    wears cpap    Tension headache    Unspecified constipation    Unspecified essential hypertension    Unspecified hereditary  and idiopathic peripheral neuropathy    Unspecified vitamin D  deficiency     Assessment: 63 y/o M presents to the ED with chest pain. ESRD on HD. Has been undergoing work up for possible TAVR. High sensitivity troponin is increasing (153>>1098). Starting heparin . Above labs reviewed. PTA meds reviewed.  11/18: Heparin  level subtherapeutic at 0.10. Hb 7.9, plt 130. No pauses or issues noted per RN.   Goal of Therapy:  Heparin  level 0.3-0.7 units/ml Monitor platelets by anticoagulation protocol: Yes   Plan: Heparin  bolus 2000 units x1  Increase heparin  drip to 1250 units/hr Heparin  level in 8 hours Daily CBC/Heparin  level Monitor for bleeding F/u cardiology / [facility] plan   Rankin Sams, PharmD, BCPS, BCCCP Clinical Pharmacist

## 2023-12-10 NOTE — Progress Notes (Signed)
 PHARMACY - ANTICOAGULATION CONSULT NOTE  Pharmacy Consult for Heparin   Indication: chest pain/ACS  Allergies  Allergen Reactions   Codeine Nausea And Vomiting   Lortab [Hydrocodone -Acetaminophen ] Nausea And Vomiting    Patient Measurements: Height: 5' 7 (170.2 cm) Weight: 87 kg (191 lb 12.8 oz) IBW/kg (Calculated) : 66.1 HEPARIN  DW (KG): 82  Vital Signs: Temp: 100.9 F (38.3 C) (11/18 1900) Temp Source: Oral (11/18 1900) BP: 144/69 (11/18 2015) Pulse Rate: 107 (11/18 2015)  Labs: Recent Labs    12/09/23 2131 12/09/23 2221 12/09/23 2228 12/09/23 2348 12/10/23 1004 12/10/23 1711 12/10/23 2000  HGB 7.3*  --  8.2*  --  7.9* 7.4*  --   HCT 22.6*  --  24.0*  --  25.5* 23.1*  --   PLT 128*  --   --   --  130* 134*  --   LABPROT  --  15.3*  --   --   --   --   --   INR  --  1.1  --   --   --   --   --   HEPARINUNFRC  --   --   --   --  0.10*  --  0.68  CREATININE 5.37*  --  5.60*  --  6.47*  --   --   TROPONINIHS 153*  --   --  1,098* 5,953* 5,620*  --     Estimated Creatinine Clearance: 12.6 mL/min (A) (by C-G formula based on SCr of 6.47 mg/dL (H)).   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)    Anxiety    Asthma    Asthma    moderate persistent   Carpal tunnel syndrome    Cellulitis and abscess of trunk    Cholelithiasis 07/13/2014   Chronic headaches    Cigarette smoker 07/11/2017   Stopped 2/022  - referred to start Lung cancer screening 04/2021         Debility, unspecified    Dermatophytosis of the body    Dysrhythmia    history of   Edema    End stage renal disease on dialysis (HCC)    MWF; [facility] in [place] (10/21/2014)   Essential hypertension, benign    Generalized anxiety disorder 05/25/2022   GERD (gastroesophageal reflux disease)    Gout, unspecified    History  of renal transplant    HTN (hypertension)    Hypertrophy of prostate without urinary obstruction and other lower urinary tract symptoms (LUTS)    Hypotension, unspecified    Hypoxia 03/21/2023   Impotence of organic origin    Insomnia, unspecified    Localization-related (focal) (partial) epilepsy and epileptic syndromes with complex partial seizures, without mention of intractable epilepsy    12-15-19- Wife states he has NEVER had a seizure    Lumbago    Memory loss    OSA on CPAP    Other and unspecified hyperlipidemia    controlled /managed per wife    Other chronic nonalcoholic liver disease    Other malaise and fatigue    Other nonspecific abnormal serum enzyme levels    Pain in joint, lower leg    Pain in joint, upper arm    Pneumonia several times   PONV (postoperative nausea and vomiting)    Secondary hyperparathyroidism (of  renal origin)    Shortness of breath    Sleep apnea    wears cpap    Tension headache    Unspecified constipation    Unspecified essential hypertension    Unspecified hereditary and idiopathic peripheral neuropathy    Unspecified vitamin D  deficiency     Assessment: 63 y/o M presents to the ED with chest pain. ESRD on HD. Has been undergoing work up for possible TAVR. High sensitivity troponin is increasing (153>>1098). Starting heparin . Above labs reviewed. PTA meds reviewed.  11/18: Heparin  level subtherapeutic at 0.10. Hb 7.9, plt 130. No pauses or issues noted per RN.   11/18 PM update: Heparin  level 0.68 - therapeutic and at upper end of goal post bolus and increase.   Goal of Therapy:  Heparin  level 0.3-0.7 units/ml Monitor platelets by anticoagulation protocol: Yes   Plan:  Continue heparin  drip to 1250 units/hr Heparin  level in 8 hours Daily CBC/Heparin  level Monitor for bleeding F/u cardiology / CTCS plan   Harlene Boga, PharmD, BCPS, BCCCP Clinical Pharmacist Please refer to [facility] for [facility] numbers

## 2023-12-10 NOTE — Consult Note (Addendum)
 Cardiology Consultation   Patient ID: Kirk [name] MRN: [id number]; DOB: 1960-02-10  Admit date: 12/09/2023 Date of Consult: 12/10/2023  PCP:  Leonarda Roxan [name], NP   [facility] Providers Cardiologist:  Vinie [name] Maxcy, MD        Patient Profile: Kirk [name] is a 63 y.o. 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  History of Present Illness: Mr. Kirk [name] was at his dialysis session earlier this past Monday.  During his dialysis session his blood pressures were low (which has usually happen to him in the past).  States about 2 L of fluid was taken off (usually 4 to 5 L were taken off).  At some point after dialysis, patient developed chest pain.  He states he also had sudden onset palpitations that occurred at some point.  Patient has had a history of chronic chest pain that he states has gotten worse over the past few months.  The pain is sudden, substernal, non-radiating. Associated symptoms include worsening fatigue and dyspnea on exertion.  He states at 1 point his chest pain was a 10 out of 10 in severity.  This is what prompted his ER visit.   Patient follows up with Dr.Thukkani for management of aortic stenosis.  He recently saw Thukkani in 11/05/2023.  His symptoms were contributed to low-flow low gradient aortic stenosis.  The patient agreed for transcatheter aortic valve implant.  He has currently been undergoing workup prior to this procedure.  Patient's most recent echocardiogram on 10/05/2023 showed an EF of 20 to 25%, moderately dilated left atrium, severe aortic stenosis with a dimensionless index of 0.27, aortic  valve area 1.04, mean gradient 20.  Coronary CT was obtained which showed diffuse aortic atherosclerosis.  Patient has recently had a left heart cath a couple months ago which showed nonobstructive coronary artery disease. He's had several ER visits for substernal chest pain.  Patient has been on dialysis for the past 16 years.  He has an AV fistula on the left arm.  He is on heart failure GDMT which he states he has been adherent to.  Patient had episodes of A-fib with RVR in the past.  Previously, he was deemed not a candidate for oral anticoagulation due to a past history of a subdural hematoma in 2023.  In the ER, notable labs include hsTnT [phone].  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)    Anxiety    Asthma    Asthma    moderate persistent   Carpal tunnel syndrome    Cellulitis and abscess of trunk    Cholelithiasis 07/13/2014   Chronic headaches    Cigarette smoker 07/11/2017   Stopped 2/022  - referred to start Lung cancer screening 04/2021         Debility, unspecified    Dermatophytosis of the body    Dysrhythmia    history of   Edema    End stage renal disease on dialysis [facility])    MWF; [facility] in [place] (10/21/2014)   Essential hypertension,  benign    Generalized anxiety disorder 05/25/2022   GERD (gastroesophageal reflux disease)    Gout, unspecified    History of renal transplant    HTN (hypertension)    Hypertrophy of prostate without urinary obstruction and other lower urinary tract symptoms (LUTS)    Hypotension, unspecified    Hypoxia 03/21/2023   Impotence of organic origin    Insomnia, unspecified    Localization-related (focal) (partial) epilepsy and epileptic syndromes with complex partial seizures, without mention of intractable epilepsy    12-15-19- Wife states he has NEVER had a seizure     Lumbago    Memory loss    OSA on CPAP    Other and unspecified hyperlipidemia    controlled /managed per wife    Other chronic nonalcoholic liver disease    Other malaise and fatigue    Other nonspecific abnormal serum enzyme levels    Pain in joint, lower leg    Pain in joint, upper arm    Pneumonia several times   PONV (postoperative nausea and vomiting)    Secondary hyperparathyroidism (of renal origin)    Shortness of breath    Sleep apnea    wears cpap    Tension headache    Unspecified constipation    Unspecified essential hypertension    Unspecified hereditary and idiopathic peripheral neuropathy    Unspecified vitamin D  deficiency     Past Surgical History:  Procedure Laterality Date   A/V FISTULAGRAM N/A 03/28/2023   Procedure: A/V Fistulagram;  Surgeon: Melia Lynwood ORN, MD;  [facility]: [facility] INVASIVE CV LAB;  Service: Cardiovascular;  Laterality: N/A;   A/V FISTULAGRAM Left 11/07/2023   Procedure: A/V Fistulagram;  Surgeon: Serene Gaile ORN, MD;  Location: [facility] PV LAB;  Service: Cardiovascular;  Laterality: Left;   AV FISTULA PLACEMENT Left ?2010   forearm; at [facility]   BACK SURGERY     CARDIAC CATHETERIZATION  03/21/2011   CHOLECYSTECTOMY N/A 10/21/2014   Procedure: LAPAROSCOPIC CHOLECYSTECTOMY WITH INTRAOPERATIVE CHOLANGIOGRAM;  Surgeon: Deward Null III, MD;  Location[facility];  Service: General;  Laterality: N/A;   COLONOSCOPY     ESOPHAGOGASTRODUODENOSCOPY N/A 06/21/2023   Procedure: EGD (ESOPHAGOGASTRODUODENOSCOPY);  Surgeon: Legrand Victory LITTIE DOUGLAS, MD;  Location: [facility] ENDOSCOPY;  Service: Gastroenterology;  Laterality: N/A;   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   IR ANGIO INTRA EXTRACRAN SEL INTERNAL CAROTID UNI R MOD SED  11/30/2021   IR ANGIOGRAM FOLLOW UP STUDY  11/30/2021   IR AV DIALY SHUNT INTRO NEEDLE/INTRACATH INITIAL W/PTA/IMG LEFT  12/13/2022   IR BONE MARROW BIOPSY & ASPIRATION  03/27/2022   IR NEURO EACH ADD'L AFTER BASIC UNI RIGHT (MS)   11/30/2021   IR TRANSCATH/EMBOLIZ  11/30/2021   IR US  GUIDE VASC ACCESS LEFT  12/13/2022   KIDNEY TRANSPLANT  08/17/2011   [facility]    LAPAROSCOPIC CHOLECYSTECTOMY  10/21/2014   w/IOC   LEFT HEART CATH AND CORONARY ANGIOGRAPHY N/A 04/09/2023   Procedure: LEFT HEART CATH AND CORONARY ANGIOGRAPHY;  Surgeon: Anner Alm ORN, MD;  Location: [facility];  Service: Cardiovascular;  Laterality: N/A;   LEFT HEART CATH AND CORONARY ANGIOGRAPHY N/A 10/04/2023   Procedure: LEFT HEART CATH AND CORONARY ANGIOGRAPHY;  Surgeon: Anner Alm ORN, MD;  Location: [facility] INVASIVE CV LAB;  Service: Cardiovascular;  Laterality: N/A;   LEFT HEART CATHETERIZATION WITH CORONARY ANGIOGRAM N/A 03/21/2011   Procedure: LEFT HEART CATHETERIZATION WITH CORONARY ANGIOGRAM;  Surgeon: Vinie KYM Maxcy, MD;  Location: [facility] CATH LAB;  Service: Cardiovascular;  Laterality: N/A;   NEPHRECTOMY  08/2013   removed transplaned kidney   PERIPHERAL VASCULAR BALLOON ANGIOPLASTY Left 03/28/2023   Procedure: PERIPHERAL VASCULAR BALLOON ANGIOPLASTY;  Surgeon: Melia Lynwood ORN, MD;  Location: [facility];  Service: Cardiovascular;  Laterality: Left;  80% outflow cephalic   POLYPECTOMY     POSTERIOR FUSION CERVICAL SPINE  06/25/2012   for spinal stenosis   RADIOLOGY WITH ANESTHESIA N/A 11/30/2021   Procedure: RIGHT MIDDLE MENINGEAL ARTERY EMBOLIZATION;  Surgeon: Radiologist, Medication, MD;  Location: [facility] OR;  Service: Radiology;  Laterality: N/A;   VASECTOMY  2010   VENOUS ANGIOPLASTY  11/07/2023   Procedure: VENOUS ANGIOPLASTY;  Surgeon: Serene Gaile ORN, MD;  Location: [facility] PV LAB;  Service: Cardiovascular;;  cephalic vein     Home Medications:  Prior to Admission medications   Medication Sig Start Date End Date Taking? Authorizing Provider  acetaminophen  (TYLENOL ) 500 MG tablet Take 1 tablet (500 mg total) by mouth every 6 (six) hours as needed for moderate pain. Patient taking differently: Take 1,000 mg by mouth every 6 (six) hours  as needed for moderate pain (pain score 4-6) or headache. 09/12/14   Jadine Toribio SQUIBB, MD  albuterol  (PROVENTIL ) (2.5 MG/3ML) 0.083% nebulizer solution Take 3 mLs (2.5 mg total) by nebulization every 4 (four) hours as needed for wheezing or shortness of breath. 06/25/23   Elgergawy, Brayton RAMAN, MD  albuterol  (VENTOLIN  HFA) 108 (90 Base) MCG/ACT inhaler Inhale 2 puffs into the lungs every 6 (six) hours as needed for wheezing or shortness of breath. 10/29/22   Darlean Ozell NOVAK, MD  aspirin  EC 81 MG tablet Take 81 mg by mouth daily in the afternoon.    [provider]  Cholecalciferol  (VITAMIN D3) 25 MCG (1000 UT) CAPS Take 4,000 Units by mouth See admin instructions. Patient takes Earlean Everts, Sat, Sun    [provider]  doxazosin  (CARDURA ) 4 MG tablet Take 1 tablet (4 mg total) by mouth at bedtime. 05/31/23   Vannie Reche RAMAN, NP  fluticasone -salmeterol (ADVAIR HFA) 45-21 MCG/ACT inhaler  07/05/23   Darlean Ozell NOVAK, MD  guaiFENesin  (MUCINEX ) 600 MG 12 hr tablet Take 1,200 mg by mouth 2 (two) times daily as needed for cough or to loosen phlegm.    [provider]  hydrALAZINE  (APRESOLINE ) 50 MG tablet Take 1 tablet (50 mg total) by mouth 3 (three) times daily. 10/16/23 01/14/24  Meng, Hao, PA  ipratropium-albuterol  (DUONEB) 0.5-2.5 (3) MG/3ML SOLN Take 3 mLs by nebulization every 6 (six) hours. Please take every 6 hours scheduled for next 5 days then as needed. 06/25/23   Elgergawy, Brayton RAMAN, MD  isosorbide  mononitrate (IMDUR ) 60 MG 24 hr tablet Take 60 mg by mouth daily. 10/04/22   [provider]  metoprolol  succinate (TOPROL -XL) 25 MG 24 hr tablet Take 1 tablet (25 mg total) by mouth daily. 10/07/23   Pahwani, Ravi, MD  multivitamin (RENA-VIT) TABS tablet Take 1 tablet by mouth daily. Patient taking differently: Take 1 tablet by mouth See admin instructions. Take 1 tablet by mouth once daily in the afternoon. 01/29/23   Ngetich, Dinah C, NP  oxyCODONE  (ROXICODONE ) 5 MG immediate  release tablet Take 1 tablet (5 mg total) by mouth every 4 (four) hours as needed for severe pain (pain score 7-10). 11/15/23   Freddi Hamilton, MD  OXYGEN  Inhale into the lungs. 2 L  cont.    [provider]  pravastatin  (PRAVACHOL ) 40  MG tablet TAKE 1 TABLET BY MOUTH EVERY DAY IN THE EVENING Patient taking differently: Take 40 mg by mouth at bedtime. TAKE 1 TABLET BY MOUTH EVERY DAY IN THE EVENING 12/04/22   Ngetich, Dinah C, NP  sevelamer  carbonate (RENVELA ) 2.4 g PACK Take 4.8 g by mouth 3 (three) times daily with meals. Also takes with his snacks    [provider]  sucroferric oxyhydroxide (VELPHORO ) 500 MG chewable tablet Chew 1,000-1,500 mg by mouth See admin instructions. Take 3 tablets (1500mg ) by mouth three times daily with meals and take 2 tablets (1000mg ) with snacks.    [provider]  traZODone  (DESYREL ) 50 MG tablet TAKE 0.5-1 TABLET (25-50 MG TOTAL) BY MOUTH AT BEDTIME AS NEEDED. FOR SLEEP Patient taking differently: Take 50 mg by mouth at bedtime. 04/08/23   Ngetich, Dinah C, NP  UNABLE TO FIND Inhale 1 Device into the lungs at bedtime. CPAP    [provider]    Scheduled Meds:  aspirin   324 mg Oral Once   heparin   4,000 Units Intravenous Once   Continuous Infusions:  heparin      lactated ringers  150 mL/hr at 12/09/23 2240   nitroGLYCERIN      vancomycin  1,750 mg (12/09/23 2342)   PRN Meds:   Allergies:    Allergies  Allergen Reactions   Codeine Nausea And Vomiting   Hydrocodone -Acetaminophen  Nausea And Vomiting    Social History:   Social History   Socioeconomic History   Marital status: Married    Spouse name: Edna   Number of children: 3   Years of education: Not on file   Highest education level: Not on file  Occupational History   Occupation: disabled    Employer: DISABLED  Tobacco Use   Smoking status: Former    Current packs/day: 0.00    Average packs/day: 0.5 packs/day for 32.0 years (16.0 ttl pk-yrs)     Types: Cigarettes    Start date: 83    Quit date: 02/24/2020    Years since quitting: 3.7   Smokeless tobacco: Never  Vaping Use   Vaping status: Never Used  Substance and Sexual Activity   Alcohol use: No    Alcohol/week: 0.0 standard drinks of alcohol   Drug use: No   Sexual activity: Yes  Other Topics Concern   Not on file  Social History Narrative   Drinks 1 cup of caffeine daily.   Social Drivers of Health   Financial Resource Strain: Medium Risk (11/02/2021)   Overall Financial Resource Strain (CARDIA)    Difficulty of Paying Living Expenses: Somewhat hard  Food Insecurity: No Food Insecurity (06/20/2023)   Hunger Vital Sign    Worried About Running Out of Food in the Last Year: Never true    Ran Out of Food in the Last Year: Never true  Transportation Needs: No Transportation Needs (06/20/2023)   PRAPARE - Administrator, Civil Service (Medical): No    Lack of Transportation (Non-Medical): No  Physical Activity: Insufficiently Active (06/25/2017)   Exercise Vital Sign    Days of Exercise per Week: 3 days    Minutes of Exercise per Session: 20 min  Stress: No Stress Concern Present (06/25/2017)   [employer] of Occupational Health - Occupational Stress Questionnaire    Feeling of Stress : Only a little  Social Connections: Patient Declined (03/17/2023)   Social Connection and Isolation Panel    Frequency of Communication with Friends and Family: Patient declined    Frequency  of Social Gatherings with Friends and Family: Patient declined    Attends Religious Services: Patient declined    Database Administrator or Organizations: Patient declined    Attends Banker Meetings: Patient declined    Marital Status: Patient declined  Intimate Partner Violence: Not At Risk (06/20/2023)   Humiliation, Afraid, Rape, and Kick questionnaire    Fear of Current or Ex-Partner: No    Emotionally Abused: No    Physically Abused: No    Sexually Abused: No     Family History:    Family History  Adopted: Yes  Problem Relation Age of Onset   Colon cancer Neg Hx    Esophageal cancer Neg Hx    Rectal cancer Neg Hx    Stomach cancer Neg Hx    Colon polyps Neg Hx      ROS:  Please see the history of present illness.   All other ROS reviewed and negative.     Physical Exam/Data: Vitals:   12/09/23 2300 12/10/23 0000 12/10/23 0100 12/10/23 0128  BP: (!) 145/78 131/79 (!) 147/78   Pulse:  (!) 104    Resp: (!) 21 (!) 21 (!) 29   Temp:    98.2 F (36.8 C)  TempSrc:    Oral  SpO2: 96% 92%    Weight:      Height:       No intake or output data in the 24 hours ending 12/10/23 0129    12/09/2023    9:24 PM 12/05/2023   10:41 AM 11/20/2023    9:04 AM  Last 3 Weights  Weight (lbs) 180 lb 12.4 oz 181 lb 11.2 oz 186 lb 1.6 oz  Weight (kg) 82 kg 82.419 kg 84.414 kg     Body mass index is 28.31 kg/m.  General:  Well nourished, well developed, in no acute distress HEENT: normal Neck: no JVD Vascular: No carotid bruits; Distal pulses 2+ bilaterally Cardiac:  normal S1, S2; III/VI systolic murmur Lungs:  clear to auscultation bilaterally, no wheezing, rhonchi or rales  Abd: soft, nontender, no hepatomegaly  Ext: no edema Musculoskeletal:  No deformities, BUE and BLE strength normal and equal Skin: warm and dry  Neuro:  CNs 2-12 intact, no focal abnormalities noted Psych:  Normal affect   EKG:  The EKG was personally reviewed and demonstrates:  sinus rhythm Telemetry:  Telemetry was personally reviewed and demonstrates:  sinus  Relevant CV Studies: reviewed  Laboratory Data: High Sensitivity Troponin:   Recent Labs  Lab 11/14/23 1014 11/15/23 1600 11/15/23 1759 12/09/23 2131 12/09/23 2348  TROPONINIHS 49* 45* 49* 153* 1,098*     Chemistry Recent Labs  Lab 12/09/23 2131 12/09/23 2228  NA 135 136  K 3.4* 3.7  CL 92* 93*  CO2 28  --   GLUCOSE 136* 113*  BUN 13 15  CREATININE 5.37* 5.60*  CALCIUM  8.2*  --    GFRNONAA 11*  --   ANIONGAP 15  --     No results for input(s): PROT, ALBUMIN , AST, ALT, ALKPHOS, BILITOT in the last 168 hours. Lipids No results for input(s): CHOL, TRIG, HDL, LABVLDL, LDLCALC, CHOLHDL in the last 168 hours.  Hematology Recent Labs  Lab 12/09/23 2131 12/09/23 2228  WBC 5.4  --   RBC 2.33*  --   HGB 7.3* 8.2*  HCT 22.6* 24.0*  MCV 97.0  --   MCH 31.3  --   MCHC 32.3  --   RDW 15.6*  --  PLT 128*  --    Thyroid  No results for input(s): TSH, FREET4 in the last 168 hours.  BNP Recent Labs  Lab 12/09/23 2221  BNP 2,729.6*    DDimer No results for input(s): DDIMER in the last 168 hours.  Radiology/Studies:  CT Head Wo Contrast Result Date: 12/09/2023 EXAM: CT HEAD WITHOUT CONTRAST 12/09/2023 11:08:09 PM TECHNIQUE: CT of the head was performed without the administration of intravenous contrast. Automated exposure control, iterative reconstruction, and/or weight based adjustment of the mA/kV was utilized to reduce the radiation dose to as low as reasonably achievable. COMPARISON: CT head 03/16/2023. CLINICAL HISTORY: c/f poss meningitis. FINDINGS: BRAIN AND VENTRICLES: No acute hemorrhage. No evidence of acute infarct. No hydrocephalus. No extra-axial collection. No mass effect or midline shift. Atherosclerotic calcifications are present within the cavernous internal carotid and vertebral arteries. ORBITS: No acute abnormality. SINUSES: No acute abnormality. SOFT TISSUES AND SKULL: No acute soft tissue abnormality. Surgical changes along the right temporal calvarium. No skull fracture. IMPRESSION: 1. No acute intracranial abnormality. Electronically signed by: Morgane Naveau MD 12/09/2023 11:19 PM EST RP Workstation: [id number]   DG Chest Port 1 View Result Date: 12/09/2023 CLINICAL DATA:  Questionable sepsis EXAM: PORTABLE CHEST 1 VIEW COMPARISON:  Chest x-ray 11/15/2023 FINDINGS: Heart is enlarged. There central patchy opacities in  both lungs. There is no pleural effusion or pneumothorax. No acute fractures are seen. IMPRESSION: Cardiomegaly with central patchy opacities in both lungs, which may represent pulmonary edema or infection. Electronically Signed   By: Greig Pique M.D.   On: 12/09/2023 23:15     Assessment and Plan:  DEVANTAE BABE is a 63 y.o. 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   Patient presents today with cardiac chest pain with evidence of acute myocardial injury.  Suspect suspect patient's presentation is from type II NSTEMI.  He has several comorbid conditions that are likely playing a role which include end-stage kidney disease, severe aortic stenosis, and chronic anemia.  I think his chronic symptoms and myocardial injury are mostly driven by his severe aortic stenosis.  Patient also endorsed sudden onset palpitations, which are concerning for episodes of paroxysmal AF with RVR.  Patient is currently in sinus rhythm, and his symptoms are well-controlled.  Patient continues to require multiple ER visits for chest pain.  Will discuss his case regarding timing of potential transcatheter aortic valve implant in the a.m.   Recommendations -continue to monitor on telemetry for arrhythmia -obtain iron studies -continue home HF medications -continue home metoprolol     Risk Assessment/Risk Scores:    TIMI Risk Score for Unstable Angina or Non-ST Elevation MI:   The patient's TIMI risk score is  , which indicates a  % risk of all cause mortality, new or recurrent myocardial infarction or need for urgent revascularization in the next 14 days.     CHA2DS2-VASc Score = 3   This indicates a 3.2% annual risk of stroke. The patient's score is based upon: CHF History: 1 HTN History: 1 Diabetes History: 0 Stroke History: 0 Vascular Disease History: 1 Age Score: 0 Gender Score: 0        For questions or updates, please contact [facility] Please consult [email or web address] for  contact info under      Signed, Atia Haupt A Xariah Silvernail, MD  12/10/2023 1:29 AM

## 2023-12-10 NOTE — Consult Note (Signed)
 Renal Service Consult Note [facility] [name], MD  Patient: Kirk [name] List Date: 12/10/2023 Requesting Physician: Dr. FABIENE Skeeter  Reason for Consult: [name] w/ CP and NSTEMI HPI: The patient is a 63 y.o. year-old w/ PMH as below who presented to ED last night with chest pain.  He had dialysis yesterday.  Also shortness of breath.  In the ED BP 130/71, HR 106, RR 18, temp 100.2.  Labs showed K+ 3.4, creatinine 5.3, hemoglobin 7.3.  Flu and COVID were negative chest x-ray showed bilateral faint pulmonary infiltrates infection versus edema.  CT brain was negative.  Patient was started on IV heparin  and also received aspirin .  Cardiology consulted.  We are asked to see for dialysis.   Pt seen in the ED room.  Patient is out of breath but not severely.  He does not think he has fluid on.  LVEF 25 to 30% last done in September.   ROS - denies CP, no joint pain, no HA, no blurry vision, no rash, no diarrhea, no nausea/ vomiting   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)    Anxiety    Asthma    Asthma    moderate persistent   Carpal tunnel syndrome    Cellulitis and abscess of trunk    Cholelithiasis 07/13/2014   Chronic headaches    Cigarette smoker 07/11/2017   Stopped 2/022  - referred to start Lung cancer screening 04/2021         Debility, unspecified    Dermatophytosis of the body    Dysrhythmia    history of   Edema    End stage renal disease on dialysis (HCC)    MWF; [facility] in [place] (10/21/2014)   Essential hypertension, benign    Generalized anxiety disorder 05/25/2022   GERD (gastroesophageal reflux disease)    Gout, unspecified    History of renal transplant    HTN (hypertension)    Hypertrophy of prostate without urinary obstruction and other lower  urinary tract symptoms (LUTS)    Hypotension, unspecified    Hypoxia 03/21/2023   Impotence of organic origin    Insomnia, unspecified    Localization-related (focal) (partial) epilepsy and epileptic syndromes with complex partial seizures, without mention of intractable epilepsy    12-15-19- Wife states he has NEVER had a seizure    Lumbago    Memory loss    OSA on CPAP    Other and unspecified hyperlipidemia    controlled /managed per wife    Other chronic nonalcoholic liver disease    Other malaise and fatigue    Other nonspecific abnormal serum enzyme levels    Pain in joint, lower leg    Pain in joint, upper arm    Pneumonia several times   PONV (postoperative nausea and vomiting)    Secondary hyperparathyroidism (of renal origin)    Shortness of breath    Sleep apnea    wears cpap    Tension headache    Unspecified constipation    Unspecified essential hypertension    Unspecified hereditary and idiopathic peripheral neuropathy    Unspecified vitamin D  deficiency    Past Surgical History  Past Surgical History:  Procedure Laterality Date   A/V FISTULAGRAM N/A 03/28/2023   Procedure: A/V Fistulagram;  Surgeon: Melia Lynwood ORN, MD;  Location: [facility] INVASIVE CV LAB;  Service: Cardiovascular;  Laterality: N/A;   A/V FISTULAGRAM Left 11/07/2023   Procedure: A/V Fistulagram;  Surgeon: Serene Gaile ORN, MD;  Location: [facility] PV LAB;  Service: Cardiovascular;  Laterality: Left;   AV FISTULA PLACEMENT Left ?2010   forearm; at [facility]   BACK SURGERY     CARDIAC CATHETERIZATION  03/21/2011   CHOLECYSTECTOMY N/A 10/21/2014   Procedure: LAPAROSCOPIC CHOLECYSTECTOMY WITH INTRAOPERATIVE CHOLANGIOGRAM;  Surgeon: Deward Null III, MD;  Location: [facility] OR;  Service: General;  Laterality: N/A;   COLONOSCOPY     ESOPHAGOGASTRODUODENOSCOPY N/A 06/21/2023   Procedure: EGD (ESOPHAGOGASTRODUODENOSCOPY);  Surgeon: Legrand Victory LITTIE DOUGLAS, MD;  Location: [facility] ENDOSCOPY;  Service: Gastroenterology;   Laterality: N/A;   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   IR ANGIO INTRA EXTRACRAN SEL INTERNAL CAROTID UNI R MOD SED  11/30/2021   IR ANGIOGRAM FOLLOW UP STUDY  11/30/2021   IR AV DIALY SHUNT INTRO NEEDLE/INTRACATH INITIAL W/PTA/IMG LEFT  12/13/2022   IR BONE MARROW BIOPSY & ASPIRATION  03/27/2022   IR NEURO EACH ADD'L AFTER BASIC UNI RIGHT (MS)  11/30/2021   IR TRANSCATH/EMBOLIZ  11/30/2021   IR US  GUIDE VASC ACCESS LEFT  12/13/2022   KIDNEY TRANSPLANT  08/17/2011   [facility]    LAPAROSCOPIC CHOLECYSTECTOMY  10/21/2014   w/IOC   LEFT HEART CATH AND CORONARY ANGIOGRAPHY N/A 04/09/2023   Procedure: LEFT HEART CATH AND CORONARY ANGIOGRAPHY;  Surgeon: Anner Alm ORN, MD;  Location: [facility] INVASIVE CV LAB;  Service: Cardiovascular;  Laterality: N/A;   LEFT HEART CATH AND CORONARY ANGIOGRAPHY N/A 10/04/2023   Procedure: LEFT HEART CATH AND CORONARY ANGIOGRAPHY;  Surgeon: Anner Alm ORN, MD;  Location: [facility] INVASIVE CV LAB;  Service: Cardiovascular;  Laterality: N/A;   LEFT HEART CATHETERIZATION WITH CORONARY ANGIOGRAM N/A 03/21/2011   Procedure: LEFT HEART CATHETERIZATION WITH CORONARY ANGIOGRAM;  Surgeon: Vinie KYM Maxcy, MD;  Location: [facility] CATH LAB;  Service: Cardiovascular;  Laterality: N/A;   NEPHRECTOMY  08/2013   removed transplaned kidney   PERIPHERAL VASCULAR BALLOON ANGIOPLASTY Left 03/28/2023   Procedure: PERIPHERAL VASCULAR BALLOON ANGIOPLASTY;  Surgeon: Melia Lynwood ORN, MD;  Location: [facility] INVASIVE CV LAB;  Service: Cardiovascular;  Laterality: Left;  80% outflow cephalic   POLYPECTOMY     POSTERIOR FUSION CERVICAL SPINE  06/25/2012   for spinal stenosis   RADIOLOGY WITH ANESTHESIA N/A 11/30/2021   Procedure: RIGHT MIDDLE MENINGEAL ARTERY EMBOLIZATION;  Surgeon: Radiologist, Medication, MD;  Location: [facility] OR;  Service: Radiology;  Laterality: N/A;   VASECTOMY  2010   VENOUS ANGIOPLASTY  11/07/2023   Procedure: VENOUS ANGIOPLASTY;  Surgeon: Serene Gaile ORN, MD;  Location: [facility] PV LAB;   Service: Cardiovascular;;  cephalic vein   Family History  Family History  Adopted: Yes  Problem Relation Age of Onset   Colon cancer Neg Hx    Esophageal cancer Neg Hx    Rectal cancer Neg Hx    Stomach cancer Neg Hx    Colon polyps Neg Hx    Social History  reports that he quit smoking about 3 years ago. His smoking use included cigarettes. He started smoking about 32 years ago. He has a 16 pack-year smoking history. He has never used smokeless tobacco. He reports that he does not drink alcohol and does not use drugs. Allergies  Allergies  Allergen Reactions   Codeine Nausea And Vomiting   Hydrocodone -Acetaminophen  Nausea And Vomiting  Home medications Prior to Admission medications   Medication Sig Start Date End Date Taking? Authorizing Provider  acetaminophen  (TYLENOL ) 500 MG tablet Take 1 tablet (500 mg total) by mouth every 6 (six) hours as needed for moderate pain. Patient taking differently: Take 1,000 mg by mouth every 6 (six) hours as needed for moderate pain (pain score 4-6) or headache. 09/12/14   Jadine Toribio SQUIBB, MD  albuterol  (PROVENTIL ) (2.5 MG/3ML) 0.083% nebulizer solution Take 3 mLs (2.5 mg total) by nebulization every 4 (four) hours as needed for wheezing or shortness of breath. 06/25/23   Elgergawy, Brayton RAMAN, MD  albuterol  (VENTOLIN  HFA) 108 (90 Base) MCG/ACT inhaler Inhale 2 puffs into the lungs every 6 (six) hours as needed for wheezing or shortness of breath. 10/29/22   Darlean Ozell NOVAK, MD  aspirin  EC 81 MG tablet Take 81 mg by mouth daily in the afternoon.    [provider]  Cholecalciferol  (VITAMIN D3) 25 MCG (1000 UT) CAPS Take 4,000 Units by mouth See admin instructions. Patient takes Earlean Everts, Sat, Sun    [provider]  doxazosin  (CARDURA ) 4 MG tablet Take 1 tablet (4 mg total) by mouth at bedtime. 05/31/23   Walker, Caitlin S, NP  fluticasone -salmeterol (ADVAIR HFA) 45-21 MCG/ACT inhaler  07/05/23   Darlean Ozell NOVAK, MD  guaiFENesin   (MUCINEX ) 600 MG 12 hr tablet Take 1,200 mg by mouth 2 (two) times daily as needed for cough or to loosen phlegm.    [provider]  hydrALAZINE  (APRESOLINE ) 50 MG tablet Take 1 tablet (50 mg total) by mouth 3 (three) times daily. 10/16/23 01/14/24  Meng, Hao, PA  ipratropium-albuterol  (DUONEB) 0.5-2.5 (3) MG/3ML SOLN Take 3 mLs by nebulization every 6 (six) hours. Please take every 6 hours scheduled for next 5 days then as needed. 06/25/23   Elgergawy, Brayton RAMAN, MD  isosorbide  mononitrate (IMDUR ) 60 MG 24 hr tablet Take 60 mg by mouth daily. 10/04/22   [provider]  metoprolol  succinate (TOPROL -XL) 25 MG 24 hr tablet Take 1 tablet (25 mg total) by mouth daily. 10/07/23   Pahwani, Ravi, MD  multivitamin (RENA-VIT) TABS tablet Take 1 tablet by mouth daily. Patient taking differently: Take 1 tablet by mouth See admin instructions. Take 1 tablet by mouth once daily in the afternoon. 01/29/23   Ngetich, Dinah C, NP  oxyCODONE  (ROXICODONE ) 5 MG immediate release tablet Take 1 tablet (5 mg total) by mouth every 4 (four) hours as needed for severe pain (pain score 7-10). 11/15/23   Freddi Hamilton, MD  OXYGEN  Inhale into the lungs. 2 L  cont.    [provider]  pravastatin  (PRAVACHOL ) 40 MG tablet TAKE 1 TABLET BY MOUTH EVERY DAY IN THE EVENING Patient taking differently: Take 40 mg by mouth at bedtime. TAKE 1 TABLET BY MOUTH EVERY DAY IN THE EVENING 12/04/22   Ngetich, Dinah C, NP  sevelamer  carbonate (RENVELA ) 2.4 g PACK Take 4.8 g by mouth 3 (three) times daily with meals. Also takes with his snacks    [provider]  sucroferric oxyhydroxide (VELPHORO ) 500 MG chewable tablet Chew 1,000-1,500 mg by mouth See admin instructions. Take 3 tablets (1500mg ) by mouth three times daily with meals and take 2 tablets (1000mg ) with snacks.    [provider]  traZODone  (DESYREL ) 50 MG tablet TAKE 0.5-1 TABLET (25-50 MG TOTAL) BY MOUTH AT BEDTIME AS NEEDED. FOR SLEEP Patient  taking differently: Take 50 mg by mouth at bedtime. 04/08/23   Ngetich, Dinah  C, NP  UNABLE TO FIND Inhale 1 Device into the lungs at bedtime. CPAP    [provider]     Vitals:   12/10/23 0637 12/10/23 0700 12/10/23 0715 12/10/23 0724  BP: (!) 147/88 (!) 143/87 (!) 154/86   Pulse: 94     Resp: 15 20 (!) 24   Temp:    98.3 F (36.8 C)  TempSrc:    Oral  SpO2: 95%     Weight:      Height:       Exam Gen alert, no distress Sclera anicteric, throat clear  +JVD Chest crackles bilat bases RRR no MRG Abd soft ntnd no mass or ascites +bs Ext no LE or UE edema, no other edema Neuro is alert, Ox 3 , nf    LVA AVF+bruit   Home bp meds: Cardura  4mg  every day Hydralazine  50mg  tid Metoprolol  xl 25 every day Chronic O2 2L [place]   OP HD: MWF G-O    4h   B400   81.3kg   LFA AVF   Heparin  2500 Last OP HD 11/17, post wt 82kg Good compliance Mircera 75 mcg q 4wks, last 11/12, due 12/10   Assessment/ Plan: NSTEMI: on IV heparin , echo pending. Cardiology consulting.  SOB: pulm infiltrates by CXR, no fever, may be pulm edema. Plan HD today w/ UF plan 3-4L.  ESRD: on HD MWF. Had HD yesterday. Plan HD today d/t SOB w/ pulm infiltrates.  HTN: bp's are good today 148/68, resume home meds post HD.  Volume: as above Anemia of esrd: Hb 7-9 here, follow.        Myer Fret  MD [facility] 12/10/2023, 9:45 AM  Recent Labs  Lab 12/09/23 2131 12/09/23 2228  HGB 7.3* 8.2*  CALCIUM  8.2*  --   CREATININE 5.37* 5.60*  K 3.4* 3.7   Inpatient medications:  aspirin   324 mg Oral Once   aspirin  EC  81 mg Oral Daily   metoprolol  succinate  12.5 mg Oral Daily   pravastatin   40 mg Oral QHS    heparin  1,000 Units/hr (12/10/23 0136)   nitroGLYCERIN  15 mcg/min (12/10/23 0603)   acetaminophen , fentaNYL  (SUBLIMAZE ) injection, melatonin, polyethylene glycol, prochlorperazine 

## 2023-12-10 NOTE — ED Provider Notes (Signed)
 Care of patient received from prior provider at 6:05 AM, please see their note for complete H/P and care plan.  Received handoff per ED course.  Clinical Course as of 12/10/23 9394  [employer] Dec 09, 2023  2314 79 YOM with chief complaint of chest pain. ESRD. Having chest pain during dialysis. Fever/cough here.  First troponin positive [CC]    Clinical Course User Index [CC] Jerral Meth, MD   CRITICAL CARE Performed by: Meth Jerral   Total critical care time: 95 minutes  Critical care time was exclusive of separately billable procedures and treating other patients.  Critical care was necessary to treat or prevent imminent or life-threatening deterioration.  Critical care was time spent personally by me on the following activities: development of treatment plan with patient and/or surrogate as well as nursing, discussions with consultants, evaluation of patient's response to treatment, examination of patient, obtaining history from patient or surrogate, ordering and performing treatments and interventions, ordering and review of laboratory studies, ordering and review of radiographic studies, pulse oximetry and re-evaluation of patient's condition.  Reassessment: Evaluated at bedside.  Rising troponin.  Cardiology called to bedside, EKG repeated.  Still having chest pain.  Started on nitro and heparin  drip due to ongoing chest pain.  Cardiology feels this is likely related to volume overload in the setting of aortic stenosis.  They recommended supportive care overnight and admission to medicine for continued infectious evaluation started by prior team.  Patient reassessed frequently, improving with current therapy.  Disposition:   Based on the above findings, I believe this patient is stable for admission.    Patient/family educated about specific findings on our evaluation and explained exact reasons for admission.  Patient/family educated about clinical situation and time was  allowed to answer questions.   Admission team communicated with and agreed with need for admission. Patient admitted. Patient  ready to move at this time.     Emergency Department Medication Summary:   Medications  lactated ringers  infusion ( Intravenous New Bag/Given 12/09/23 2240)  aspirin  chewable tablet 324 mg (324 mg Oral Not Given 12/09/23 2322)  nitroGLYCERIN  50 mg in dextrose  5 % 250 mL (0.2 mg/mL) infusion (15 mcg/min Intravenous Rate/Dose Change 12/10/23 0603)  heparin  ADULT infusion 100 units/mL (25000 units/250mL) (1,000 Units/hr Intravenous New Bag/Given 12/10/23 0136)  fentaNYL  (SUBLIMAZE ) injection 50 mcg (50 mcg Intravenous Given 12/10/23 0215)  acetaminophen  (TYLENOL ) tablet 500 mg (has no administration in time range)  prochlorperazine  (COMPAZINE ) injection 5 mg (has no administration in time range)  melatonin tablet 5 mg (has no administration in time range)  polyethylene glycol (MIRALAX  / GLYCOLAX ) packet 17 g (has no administration in time range)  lactated ringers  bolus 1,000 mL (0 mLs Intravenous Stopped 12/09/23 2351)  ceFEPIme  (MAXIPIME ) 2 g in sodium chloride  0.9 % 100 mL IVPB (0 g Intravenous Stopped 12/09/23 2337)  metroNIDAZOLE  (FLAGYL ) IVPB 500 mg (0 mg Intravenous Stopped 12/09/23 2342)  vancomycin  (VANCOREADY) IVPB 1750 mg/350 mL (0 mg Intravenous Stopped 12/10/23 0142)  acetaminophen  (TYLENOL ) tablet 1,000 mg (1,000 mg Oral Given 12/09/23 2241)  fentaNYL  (SUBLIMAZE ) injection 50 mcg (50 mcg Intravenous Given 12/10/23 0008)  heparin  bolus via infusion 4,000 Units (4,000 Units Intravenous Bolus from Bag 12/10/23 0136)  alum & mag hydroxide-simeth (MAALOX/MYLANTA) 200-200-20 MG/5ML suspension 15 mL (15 mLs Oral Given 12/10/23 0338)  HYDROmorphone  (DILAUDID ) injection 0.5 mg (0.5 mg Intravenous Given 12/10/23 0339)            Jerral Meth, MD 12/10/23 [phone]

## 2023-12-10 NOTE — Progress Notes (Addendum)
 Kirk [name] is a 63 y.o. 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  He did complete 4 hours of hemodialysis.  His pain pain was sharp, centrally located, 10 out of 10, intermittent, at rest.  Associated with shortness of breath.  Worse when taking deep breath.  EMS was activated.   In the ER, he received full dose aspirin  324 mg x 1.  His chest pain persisted.  His twelve-lead EKG showed no evidence of acute ischemia.  High-sensitivity troponin 153 with delta, repeat 1098.  EDP discussed the case with cardiology recommended heparin  drip and admit for possible NSTEMI.  Admitted by [facility], hospitalist service.  Patient seen and examined today, still in the ED.  Still complains of chest pain but down from 10 out of 10 yesterday to only 2 out of 10 today.  Also has some associated shortness of breath but no nausea, diaphoresis or palpitation.  No other complaint. Further management per cardiology, we appreciate their help.  Patient received 1 L of IV fluid bolus and then he has been on continuous IV fluid ever since.  Chronically appears to be fluid overloaded.  Discontinued IV fluids.  Due to his history of ESRD, I wonder if he needs dialysis today.  Consulted nephrology/Dr. Geralynn. Examination, abdomen soft and nontender, patient appears slightly anxious, lungs clear to auscultation.  Also, patient carries history of chronic anemia secondary to ESRD.  Current hemoglobin is 7.3.  Repeating CBC now and then will check every 12 hours, next to be checked at 5 PM.  Transfuse as needed.  Total time spent 40 minutes

## 2023-12-10 NOTE — Progress Notes (Signed)
  Echocardiogram 2D Echocardiogram has been performed.  Koleen KANDICE Popper, RDCS 12/10/2023, 10:52 AM

## 2023-12-10 NOTE — Progress Notes (Signed)
 Pt receives out-pt HD at [facility], MWF, 1120am chair time. Will continue to assist as needed.   Lavanda Laureano Hetzer Dialysis Navigator [phone]

## 2023-12-10 NOTE — H&P (Addendum)
 History and Physical  CLEMENTS TORO FMW:[id number] DOB: May 13, 1960 DOA: 12/09/2023  Referring physician: Dr. Jerral, EDP PCP: Leonarda Roxan [name], NP  Outpatient Specialists: Cardiology, nephrology. Patient coming from: Home.  Chief Complaint: Chest pain.  HPI: Kirk [name] is a 63 y.o. 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  He did complete 4 hours of hemodialysis.  Describes his pain as sharp, centrally located, 10 out of 10, intermittent, at rest.  Associated with shortness of breath.  Worse when taking deep breath.  EMS was activated.  In the ER, he received full dose aspirin  324 mg x 1.  His chest pain persisted.  His twelve-lead EKG showed no evidence of acute ischemia.  High-sensitivity troponin 153 with delta, repeat 1098.  EDP discussed the case with cardiology recommended heparin  drip and admit for NSTEMI.  Admitted by [facility], hospitalist service.  ED Course: Temperature 98.1.  BP 147/88, pulse 94, respiration rate 15, O2 saturation 95% on room air.  Review of Systems: Review of systems as noted in the HPI. All other systems reviewed and are negative.   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)    Anxiety    Asthma    Asthma    moderate persistent   Carpal tunnel syndrome    Cellulitis and abscess of trunk    Cholelithiasis 07/13/2014   Chronic headaches    Cigarette smoker 07/11/2017   Stopped 2/022  - referred to start Lung cancer screening 04/2021         Debility, unspecified    Dermatophytosis of the body    Dysrhythmia    history of   Edema    End stage renal  disease on dialysis (HCC)    MWF; [facility] in [place] (10/21/2014)   Essential hypertension, benign    Generalized anxiety disorder 05/25/2022   GERD (gastroesophageal reflux disease)    Gout, unspecified    History of renal transplant    HTN (hypertension)    Hypertrophy of prostate without urinary obstruction and other lower urinary tract symptoms (LUTS)    Hypotension, unspecified    Hypoxia 03/21/2023   Impotence of organic origin    Insomnia, unspecified    Localization-related (focal) (partial) epilepsy and epileptic syndromes with complex partial seizures, without mention of intractable epilepsy    12-15-19- Wife states he has NEVER had a seizure    Lumbago    Memory loss    OSA on CPAP    Other and unspecified hyperlipidemia    controlled /managed per wife    Other chronic nonalcoholic liver disease    Other malaise and fatigue    Other nonspecific abnormal serum enzyme levels    Pain in joint, lower leg    Pain in joint, upper arm    Pneumonia several times   PONV (postoperative nausea and vomiting)    Secondary hyperparathyroidism (of renal origin)    Shortness of breath    Sleep apnea    wears cpap    Tension headache    Unspecified constipation    Unspecified essential hypertension    Unspecified hereditary and idiopathic  peripheral neuropathy    Unspecified vitamin D  deficiency    Past Surgical History:  Procedure Laterality Date   A/V FISTULAGRAM N/A 03/28/2023   Procedure: A/V Fistulagram;  Surgeon: Melia Lynwood ORN, MD;  Location: [facility] INVASIVE CV LAB;  Service: Cardiovascular;  Laterality: N/A;   A/V FISTULAGRAM Left 11/07/2023   Procedure: A/V Fistulagram;  Surgeon: Serene Gaile ORN, MD;  Location: [facility] PV LAB;  Service: Cardiovascular;  Laterality: Left;   AV FISTULA PLACEMENT Left ?2010   forearm; at [facility]   BACK SURGERY     CARDIAC CATHETERIZATION  03/21/2011   CHOLECYSTECTOMY N/A 10/21/2014   Procedure: LAPAROSCOPIC  CHOLECYSTECTOMY WITH INTRAOPERATIVE CHOLANGIOGRAM;  Surgeon: Deward Null III, MD;  Location: [facility] OR;  Service: General;  Laterality: N/A;   COLONOSCOPY     ESOPHAGOGASTRODUODENOSCOPY N/A 06/21/2023   Procedure: EGD (ESOPHAGOGASTRODUODENOSCOPY);  Surgeon: Legrand Victory LITTIE DOUGLAS, MD;  Location: [facility] ENDOSCOPY;  Service: Gastroenterology;  Laterality: N/A;   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   IR ANGIO INTRA EXTRACRAN SEL INTERNAL CAROTID UNI R MOD SED  11/30/2021   IR ANGIOGRAM FOLLOW UP STUDY  11/30/2021   IR AV DIALY SHUNT INTRO NEEDLE/INTRACATH INITIAL W/PTA/IMG LEFT  12/13/2022   IR BONE MARROW BIOPSY & ASPIRATION  03/27/2022   IR NEURO EACH ADD'L AFTER BASIC UNI RIGHT (MS)  11/30/2021   IR TRANSCATH/EMBOLIZ  11/30/2021   IR US  GUIDE VASC ACCESS LEFT  12/13/2022   KIDNEY TRANSPLANT  08/17/2011   [facility]    LAPAROSCOPIC CHOLECYSTECTOMY  10/21/2014   w/IOC   LEFT HEART CATH AND CORONARY ANGIOGRAPHY N/A 04/09/2023   Procedure: LEFT HEART CATH AND CORONARY ANGIOGRAPHY;  Surgeon: Anner Alm ORN, MD;  Location: [facility] INVASIVE CV LAB;  Service: Cardiovascular;  Laterality: N/A;   LEFT HEART CATH AND CORONARY ANGIOGRAPHY N/A 10/04/2023   Procedure: LEFT HEART CATH AND CORONARY ANGIOGRAPHY;  Surgeon: Anner Alm ORN, MD;  Location: [facility] INVASIVE CV LAB;  Service: Cardiovascular;  Laterality: N/A;   LEFT HEART CATHETERIZATION WITH CORONARY ANGIOGRAM N/A 03/21/2011   Procedure: LEFT HEART CATHETERIZATION WITH CORONARY ANGIOGRAM;  Surgeon: Vinie KYM Maxcy, MD;  Location: [facility] CATH LAB;  Service: Cardiovascular;  Laterality: N/A;   NEPHRECTOMY  08/2013   removed transplaned kidney   PERIPHERAL VASCULAR BALLOON ANGIOPLASTY Left 03/28/2023   Procedure: PERIPHERAL VASCULAR BALLOON ANGIOPLASTY;  Surgeon: Melia Lynwood ORN, MD;  Location: [facility] INVASIVE CV LAB;  Service: Cardiovascular;  Laterality: Left;  80% outflow cephalic   POLYPECTOMY     POSTERIOR FUSION CERVICAL SPINE  06/25/2012   for spinal stenosis    RADIOLOGY WITH ANESTHESIA N/A 11/30/2021   Procedure: RIGHT MIDDLE MENINGEAL ARTERY EMBOLIZATION;  Surgeon: Radiologist, Medication, MD;  Location: [facility] OR;  Service: Radiology;  Laterality: N/A;   VASECTOMY  2010   VENOUS ANGIOPLASTY  11/07/2023   Procedure: VENOUS ANGIOPLASTY;  Surgeon: Serene Gaile ORN, MD;  Location: [facility] PV LAB;  Service: Cardiovascular;;  cephalic vein    Social History:  reports that he quit smoking about 3 years ago. His smoking use included cigarettes. He started smoking about 32 years ago. He has a 16 pack-year smoking history. He has never used smokeless tobacco. He reports that he does not drink alcohol and does not use drugs.   Allergies  Allergen Reactions   Codeine Nausea And Vomiting   Hydrocodone -Acetaminophen  Nausea And Vomiting    Family History  Adopted: Yes  Problem Relation Age of Onset   Colon cancer Neg  Hx    Esophageal cancer Neg Hx    Rectal cancer Neg Hx    Stomach cancer Neg Hx    Colon polyps Neg Hx       Prior to Admission medications   Medication Sig Start Date End Date Taking? Authorizing Provider  acetaminophen  (TYLENOL ) 500 MG tablet Take 1 tablet (500 mg total) by mouth every 6 (six) hours as needed for moderate pain. Patient taking differently: Take 1,000 mg by mouth every 6 (six) hours as needed for moderate pain (pain score 4-6) or headache. 09/12/14   Jadine Toribio SQUIBB, MD  albuterol  (PROVENTIL ) (2.5 MG/3ML) 0.083% nebulizer solution Take 3 mLs (2.5 mg total) by nebulization every 4 (four) hours as needed for wheezing or shortness of breath. 06/25/23   Elgergawy, Brayton RAMAN, MD  albuterol  (VENTOLIN  HFA) 108 (90 Base) MCG/ACT inhaler Inhale 2 puffs into the lungs every 6 (six) hours as needed for wheezing or shortness of breath. 10/29/22   Darlean Ozell NOVAK, MD  aspirin  EC 81 MG tablet Take 81 mg by mouth daily in the afternoon.    [provider]  Cholecalciferol  (VITAMIN D3) 25 MCG (1000 UT) CAPS Take 4,000 Units by mouth See  admin instructions. Patient takes Earlean Everts, Sat, Sun    [provider]  doxazosin  (CARDURA ) 4 MG tablet Take 1 tablet (4 mg total) by mouth at bedtime. 05/31/23   Vannie Reche RAMAN, NP  fluticasone -salmeterol (ADVAIR HFA) 45-21 MCG/ACT inhaler  07/05/23   Darlean Ozell NOVAK, MD  guaiFENesin  (MUCINEX ) 600 MG 12 hr tablet Take 1,200 mg by mouth 2 (two) times daily as needed for cough or to loosen phlegm.    [provider]  hydrALAZINE  (APRESOLINE ) 50 MG tablet Take 1 tablet (50 mg total) by mouth 3 (three) times daily. 10/16/23 01/14/24  Meng, Hao, PA  ipratropium-albuterol  (DUONEB) 0.5-2.5 (3) MG/3ML SOLN Take 3 mLs by nebulization every 6 (six) hours. Please take every 6 hours scheduled for next 5 days then as needed. 06/25/23   Elgergawy, Brayton RAMAN, MD  isosorbide  mononitrate (IMDUR ) 60 MG 24 hr tablet Take 60 mg by mouth daily. 10/04/22   [provider]  metoprolol  succinate (TOPROL -XL) 25 MG 24 hr tablet Take 1 tablet (25 mg total) by mouth daily. 10/07/23   Pahwani, Ravi, MD  multivitamin (RENA-VIT) TABS tablet Take 1 tablet by mouth daily. Patient taking differently: Take 1 tablet by mouth See admin instructions. Take 1 tablet by mouth once daily in the afternoon. 01/29/23   Ngetich, Dinah C, NP  oxyCODONE  (ROXICODONE ) 5 MG immediate release tablet Take 1 tablet (5 mg total) by mouth every 4 (four) hours as needed for severe pain (pain score 7-10). 11/15/23   Freddi Hamilton, MD  OXYGEN  Inhale into the lungs. 2 L  cont.    [provider]  pravastatin  (PRAVACHOL ) 40 MG tablet TAKE 1 TABLET BY MOUTH EVERY DAY IN THE EVENING Patient taking differently: Take 40 mg by mouth at bedtime. TAKE 1 TABLET BY MOUTH EVERY DAY IN THE EVENING 12/04/22   Ngetich, Dinah C, NP  sevelamer  carbonate (RENVELA ) 2.4 g PACK Take 4.8 g by mouth 3 (three) times daily with meals. Also takes with his snacks    [provider]  sucroferric oxyhydroxide (VELPHORO ) 500 MG chewable tablet  Chew 1,000-1,500 mg by mouth See admin instructions. Take 3 tablets (1500mg ) by mouth three times daily with meals and take 2 tablets (1000mg ) with snacks.    [provider]  traZODone  (  DESYREL ) 50 MG tablet TAKE 0.5-1 TABLET (25-50 MG TOTAL) BY MOUTH AT BEDTIME AS NEEDED. FOR SLEEP Patient taking differently: Take 50 mg by mouth at bedtime. 04/08/23   Ngetich, Dinah C, NP  UNABLE TO FIND Inhale 1 Device into the lungs at bedtime. CPAP    [provider]    Physical Exam: BP 135/72   Pulse (!) 104   Temp 98.2 F (36.8 C) (Oral)   Resp 14   Ht 5' 7 (1.702 m)   Wt 82 kg   SpO2 95%   BMI 28.31 kg/m   General: 63 y.o. year-old male well developed well nourished in no acute distress.  Alert and oriented x3. Cardiovascular: Regular rate and rhythm with no rubs or gallops.  No thyromegaly or JVD noted.  No lower extremity edema. 2/4 pulses in all 4 extremities. Respiratory: Clear to auscultation with no wheezes or rales. Good inspiratory effort. Abdomen: Soft nontender nondistended with normal bowel sounds x4 quadrants. Muskuloskeletal: No cyanosis, clubbing or edema noted bilaterally Neuro: CN II-XII intact, strength, sensation, reflexes Skin: No ulcerative lesions noted or rashes Psychiatry: Judgement and insight appear normal. Mood is appropriate for condition and setting          Labs on Admission:  Basic Metabolic Panel: Recent Labs  Lab 12/09/23 2131 12/09/23 2228  NA 135 136  K 3.4* 3.7  CL 92* 93*  CO2 28  --   GLUCOSE 136* 113*  BUN 13 15  CREATININE 5.37* 5.60*  CALCIUM  8.2*  --    Liver Function Tests: No results for input(s): AST, ALT, ALKPHOS, BILITOT, PROT, ALBUMIN  in the last 168 hours. No results for input(s): LIPASE, AMYLASE in the last 168 hours. No results for input(s): AMMONIA in the last 168 hours. CBC: Recent Labs  Lab 12/09/23 2131 12/09/23 2228  WBC 5.4  --   HGB 7.3* 8.2*  HCT 22.6* 24.0*  MCV 97.0  --    PLT 128*  --    Cardiac Enzymes: No results for input(s): CKTOTAL, CKMB, CKMBINDEX, TROPONINI in the last 168 hours.  BNP (last 3 results) Recent Labs    06/19/23 1606 10/04/23 1855 12/09/23 2221  BNP 1,727.0* 2,277.3* 2,729.6*    ProBNP (last 3 results) No results for input(s): PROBNP in the last 8760 hours.  CBG: No results for input(s): GLUCAP in the last 168 hours.  Radiological Exams on Admission: CT Head Wo Contrast Result Date: 12/09/2023 EXAM: CT HEAD WITHOUT CONTRAST 12/09/2023 11:08:09 PM TECHNIQUE: CT of the head was performed without the administration of intravenous contrast. Automated exposure control, iterative reconstruction, and/or weight based adjustment of the mA/kV was utilized to reduce the radiation dose to as low as reasonably achievable. COMPARISON: CT head 03/16/2023. CLINICAL HISTORY: c/f poss meningitis. FINDINGS: BRAIN AND VENTRICLES: No acute hemorrhage. No evidence of acute infarct. No hydrocephalus. No extra-axial collection. No mass effect or midline shift. Atherosclerotic calcifications are present within the cavernous internal carotid and vertebral arteries. ORBITS: No acute abnormality. SINUSES: No acute abnormality. SOFT TISSUES AND SKULL: No acute soft tissue abnormality. Surgical changes along the right temporal calvarium. No skull fracture. IMPRESSION: 1. No acute intracranial abnormality. Electronically signed by: Morgane Naveau MD 12/09/2023 11:19 PM EST RP Workstation: [id number]   DG Chest Port 1 View Result Date: 12/09/2023 CLINICAL DATA:  Questionable sepsis EXAM: PORTABLE CHEST 1 VIEW COMPARISON:  Chest x-ray 11/15/2023 FINDINGS: Heart is enlarged. There central patchy opacities in both lungs. There is no pleural effusion or pneumothorax. No acute  fractures are seen. IMPRESSION: Cardiomegaly with central patchy opacities in both lungs, which may represent pulmonary edema or infection. Electronically Signed   By: Greig Pique M.D.    On: 12/09/2023 23:15    EKG: I independently viewed the EKG done and my findings are as followed: Sinus tachycardia rate of 101.  QTc 542.  Assessment/Plan Present on Admission:  NSTEMI (non-ST elevated myocardial infarction) [facility])  Principal Problem:   NSTEMI (non-ST elevated myocardial infarction) (HCC)  NSTEMI Received a full dose aspirin  Continue heparin  drip Resume home statin and home beta-blocker Follow transthoracic echocardiogram Closely monitor on telemetry Rest of management per cardiology  ESRD on HD MWF As hemodialysis was on 12/09/2023, completed 4 hours Volume status and electrolytes managed with hemodialysis. Consult nephrology in the morning.  Chronic combined diastolic and systolic CHF Euvolemic on exam. Last 2D echo done on 10/05/2023 revealed LVEF 25 to 30% with grade 2 diastolic dysfunction Volume status managed with hemodialysis Monitor strict I's and O's and daily weight.  Hypertension Resume home Toprol -XL Closely monitor vital signs  Hyperlipidemia Resume home statin.  QTc prolongation Admission 12-lead EKG with QTc of 542 Optimize magnesium  and potassium levels Monitor on telemetry.   Critical care time: 55 minutes.   DVT prophylaxis: Heparin  drip.  Code Status: Full code.  Family Communication: None at bedside.  Disposition Plan: Admitted to progressive care unit.  Consults called: Cardiology consulted by EDP.  Admission status: Inpatient status.   Status is: Inpatient The patient requires at least 2 midnights for further evaluation and treatment of present condition.   Terry LOISE Hurst MD [facility] Pager [phone]  If 7PM-7AM, please contact night-coverage [email or web address] Password [email or web address]  12/10/2023, 3:19 AM

## 2023-12-11 ENCOUNTER — Ambulatory Visit: Admitting: Surgery

## 2023-12-11 DIAGNOSIS — I214 Non-ST elevation (NSTEMI) myocardial infarction: Secondary | ICD-10-CM | POA: Diagnosis not present

## 2023-12-11 LAB — RENAL FUNCTION PANEL
Albumin: 2.6 g/dL — ABNORMAL LOW (ref 3.5–5.0)
Anion gap: 16 — ABNORMAL HIGH (ref 5–15)
BUN: 27 mg/dL — ABNORMAL HIGH (ref 8–23)
CO2: 25 mmol/L (ref 22–32)
Calcium: 8.6 mg/dL — ABNORMAL LOW (ref 8.9–10.3)
Chloride: 93 mmol/L — ABNORMAL LOW (ref 98–111)
Creatinine, Ser: 5.9 mg/dL — ABNORMAL HIGH (ref 0.61–1.24)
GFR, Estimated: 10 mL/min — ABNORMAL LOW (ref >60)
Glucose, Bld: 149 mg/dL — ABNORMAL HIGH (ref 70–99)
Phosphorus: 4.6 mg/dL (ref 2.5–4.6)
Potassium: 4.2 mmol/L (ref 3.5–5.1)
Sodium: 134 mmol/L — ABNORMAL LOW (ref 135–145)

## 2023-12-11 LAB — CBC WITH DIFFERENTIAL/PLATELET
Abs Immature Granulocytes: 0.03 K/uL (ref 0.00–0.07)
Basophils Absolute: 0.1 K/uL (ref 0.0–0.1)
Basophils Relative: 1 %
Eosinophils Absolute: 0.1 K/uL (ref 0.0–0.5)
Eosinophils Relative: 2 %
HCT: 23.7 % — ABNORMAL LOW (ref 39.0–52.0)
Hemoglobin: 7.4 g/dL — ABNORMAL LOW (ref 13.0–17.0)
Immature Granulocytes: 1 %
Lymphocytes Relative: 14 %
Lymphs Abs: 0.6 K/uL — ABNORMAL LOW (ref 0.7–4.0)
MCH: 31.1 pg (ref 26.0–34.0)
MCHC: 31.2 g/dL (ref 30.0–36.0)
MCV: 99.6 fL (ref 80.0–100.0)
Monocytes Absolute: 0.5 K/uL (ref 0.1–1.0)
Monocytes Relative: 11 %
Neutro Abs: 2.9 K/uL (ref 1.7–7.7)
Neutrophils Relative %: 71 %
Platelets: 134 K/uL — ABNORMAL LOW (ref 150–400)
RBC: 2.38 MIL/uL — ABNORMAL LOW (ref 4.22–5.81)
RDW: 15.9 % — ABNORMAL HIGH (ref 11.5–15.5)
WBC: 4.1 K/uL (ref 4.0–10.5)
nRBC: 0 % (ref 0.0–0.2)

## 2023-12-11 LAB — CBC
HCT: 22 % — ABNORMAL LOW (ref 39.0–52.0)
Hemoglobin: 7.1 g/dL — ABNORMAL LOW (ref 13.0–17.0)
MCH: 31.8 pg (ref 26.0–34.0)
MCHC: 32.3 g/dL (ref 30.0–36.0)
MCV: 98.7 fL (ref 80.0–100.0)
Platelets: 121 K/uL — ABNORMAL LOW (ref 150–400)
RBC: 2.23 MIL/uL — ABNORMAL LOW (ref 4.22–5.81)
RDW: 15.9 % — ABNORMAL HIGH (ref 11.5–15.5)
WBC: 4 K/uL (ref 4.0–10.5)
nRBC: 0 % (ref 0.0–0.2)

## 2023-12-11 LAB — HEPARIN LEVEL (UNFRACTIONATED)
Heparin Unfractionated: <0.1 [IU]/mL — ABNORMAL LOW (ref 0.30–0.70)
Heparin Unfractionated: 0.99 [IU]/mL — ABNORMAL HIGH (ref 0.30–0.70)

## 2023-12-11 LAB — HEPATITIS B SURFACE ANTIBODY, QUANTITATIVE: Hep B S AB Quant (Post): 15.9 m[IU]/mL

## 2023-12-11 LAB — CREATININE, SERUM
Creatinine, Ser: 7 mg/dL — ABNORMAL HIGH (ref 0.61–1.24)
GFR, Estimated: 8 mL/min — ABNORMAL LOW (ref >60)

## 2023-12-11 MED ORDER — LIDOCAINE HCL (PF) 1 % IJ SOLN: 5.0000 mL | INTRAMUSCULAR | Status: DC | PRN

## 2023-12-11 MED ORDER — ALTEPLASE 2 MG IJ SOLR: 2.0000 mg | Freq: Once | INTRAMUSCULAR | Status: DC | PRN

## 2023-12-11 MED ORDER — LIDOCAINE-PRILOCAINE 2.5-2.5 % EX CREA: 1.0000 | TOPICAL_CREAM | CUTANEOUS | Status: DC | PRN

## 2023-12-11 MED ORDER — HEPARIN SODIUM (PORCINE) 5000 UNIT/ML IJ SOLN
5000.0000 [IU] | Freq: Three times a day (TID) | INTRAMUSCULAR | Status: DC
  Administered 2023-12-11 – 2023-12-12 (×4): 5000 [IU] via SUBCUTANEOUS
  Filled 2023-12-11 (×6): qty 1

## 2023-12-11 MED ORDER — HEPARIN SODIUM (PORCINE) 1000 UNIT/ML DIALYSIS
20.0000 [IU]/kg | INTRAMUSCULAR | Status: DC | PRN
  Administered 2023-12-11: 1700 [IU] via INTRAVENOUS_CENTRAL
  Filled 2023-12-11: qty 2

## 2023-12-11 MED ORDER — ANTICOAGULANT SODIUM CITRATE 4% (200MG/5ML) IV SOLN: 5.0000 mL | Status: DC | PRN

## 2023-12-11 MED ORDER — HEPARIN SODIUM (PORCINE) 1000 UNIT/ML DIALYSIS: 1000.0000 [IU] | INTRAMUSCULAR | Status: DC | PRN

## 2023-12-11 MED ORDER — HEPARIN SODIUM (PORCINE) 1000 UNIT/ML IJ SOLN
INTRAMUSCULAR | Status: AC
  Filled 2023-12-11: qty 2

## 2023-12-11 MED ORDER — PENTAFLUOROPROP-TETRAFLUOROETH EX AERO: 1.0000 | INHALATION_SPRAY | CUTANEOUS | Status: DC | PRN

## 2023-12-11 NOTE — Progress Notes (Signed)
 [facility] Progress Note   Subjective:  Had extra dialysis treatment yesterday - net UF 3L  Seen in ED. Endorses continued DOE on nasal oxygen  Some chest pain but improved from prior.   Objective Vitals:   12/11/23 0400 12/11/23 0430 12/11/23 0530 12/11/23 0830  BP:  (!) 146/91 (!) 149/65 (!) 157/78  Pulse:  99 95 100  Resp:  (!) 27 20 (!) 24  Temp: 98.6 F (37 C)     TempSrc: Oral     SpO2:  100% 98% 100%  Weight:      Height:        Additional Objective Labs: Basic Metabolic Panel: Recent Labs  Lab 12/09/23 2131 12/09/23 2228 12/10/23 1004 12/11/23 0352  NA 135 136 136 134*  K 3.4* 3.7 4.8 4.2  CL 92* 93* 93* 93*  CO2 28  --  25 25  GLUCOSE 136* 113* 121* 149*  BUN 13 15 20  27*  CREATININE 5.37* 5.60* 6.47* 5.90*  CALCIUM  8.2*  --  8.3* 8.6*  PHOS  --   --  4.4 4.6   CBC: Recent Labs  Lab 12/09/23 2131 12/09/23 2228 12/10/23 1004 12/10/23 1711 12/11/23 0352  WBC 5.4  --  5.3 5.8 4.1  NEUTROABS  --   --   --  4.9 2.9  HGB 7.3*   < > 7.9* 7.4* 7.4*  HCT 22.6*   < > 25.5* 23.1* 23.7*  MCV 97.0  --  99.6 97.5 99.6  PLT 128*  --  130* 134* 134*   < > = values in this interval not displayed.   Blood Culture    Component Value Date/Time   SDES BLOOD RIGHT ARM 12/09/2023 2221   SDES BLOOD RIGHT ARM 12/09/2023 2221   SPECREQUEST  12/09/2023 2221    BOTTLES DRAWN AEROBIC AND ANAEROBIC Blood Culture adequate volume   SPECREQUEST  12/09/2023 2221    BOTTLES DRAWN AEROBIC AND ANAEROBIC Blood Culture adequate volume   CULT GRAM POSITIVE COCCI 12/09/2023 2221   CULT GRAM POSITIVE COCCI 12/09/2023 2221   REPTSTATUS PENDING 12/09/2023 2221   REPTSTATUS PENDING 12/09/2023 2221     Physical Exam General: chronically ill appearing, nad  Heart: RRR, +SEM Lungs: Coarse breath sounds  Abdomen: non tender Extremities: trace LE edema  Dialysis Access: L AVF +bruit   Medications:  cefTRIAXone  (ROCEPHIN )  IV Stopped (12/10/23 2226)   heparin   1,400 Units/hr (12/11/23 9391)   nitroGLYCERIN  10 mcg/min (12/10/23 1655)    aspirin   324 mg Oral Once   aspirin  EC  81 mg Oral Daily   Chlorhexidine  Gluconate Cloth  6 each Topical Q0600   metoprolol  succinate  12.5 mg Oral Daily   pravastatin   40 mg Oral QHS    Dialysis Orders:  [facility] MWF  4h   B400   81.3kg   LFA AVF   Heparin  2500 Last OP HD 11/17, post wt 82kg Mircera 75 mcg q 4wks, last 11/12, due 12/10  Assessment/Plan: NSTEMI/Elevated troponin. IV Heparin /nitrogly per cardiology. Cardiology following.  HFrEF 25-30%.  Volume overload on arrival. Vasc congestion on CXR. Had extra HD Tues.  Bacteremia. Blood Cx + GPC. Antibiotics per primary  ESRD: on HD MWF. Hasn't missed HD. Extra HD Tues. Back on schedule today.  HTN. BP acceptable. On metoprolol  12.45 BID.  Anemia. ESA recently dosed. Will increase frequency to q 2 wks - next ESA due 11/26.  2HPTH. Ca/Phos ok. Continue home meds. Sevelamer  powder binder AS. TCTS consulted. For  outpatient TAVR 12/3.    Kirk Ronnald Acosta PA-C [facility] 12/11/2023,8:52 AM

## 2023-12-11 NOTE — Progress Notes (Incomplete)
 Progress Note  Patient Name: Kirk [name] Date of Encounter: 12/11/2023  Primary Cardiologist: Vinie [name], MD   Subjective   ***  Inpatient Medications    Scheduled Meds:  aspirin   324 mg Oral Once   aspirin  EC  81 mg Oral Daily   Chlorhexidine  Gluconate Cloth  6 each Topical Q0600   metoprolol  succinate  12.5 mg Oral Daily   pravastatin   40 mg Oral QHS   Continuous Infusions:  cefTRIAXone  (ROCEPHIN )  IV Stopped (12/10/23 2226)   heparin  1,400 Units/hr (12/11/23 9391)   nitroGLYCERIN  10 mcg/min (12/10/23 1655)   PRN Meds: acetaminophen , fentaNYL  (SUBLIMAZE ) injection, melatonin, polyethylene glycol, prochlorperazine    Vital Signs    Vitals:   12/11/23 0130 12/11/23 0400 12/11/23 0430 12/11/23 0530  BP: (!) 170/85  (!) 146/91 (!) 149/65  Pulse: 100  99 95  Resp: (!) 21  (!) 27 20  Temp:  98.6 F (37 C)    TempSrc:  Oral    SpO2: 100%  100% 98%  Weight:      Height:        Intake/Output Summary (Last 24 hours) at 12/11/2023 0705 Last data filed at 12/10/2023 2226 Gross per 24 hour  Intake 101.92 ml  Output 3000 ml  Net -2898.08 ml   Filed Weights   12/09/23 2124 12/10/23 1205  Weight: 82 kg 87 kg    Telemetry    *** - Personally Reviewed  ECG    *** - Personally Reviewed  Physical Exam   Physical Exam   Labs    Chemistry Recent Labs  Lab 12/09/23 2131 12/09/23 2228 12/10/23 1004 12/11/23 0352  NA 135 136 136 134*  K 3.4* 3.7 4.8 4.2  CL 92* 93* 93* 93*  CO2 28  --  25 25  GLUCOSE 136* 113* 121* 149*  BUN 13 15 20  27*  CREATININE 5.37* 5.60* 6.47* 5.90*  CALCIUM  8.2*  --  8.3* 8.6*  ALBUMIN   --   --  2.8* 2.6*  GFRNONAA 11*  --  9* 10*  ANIONGAP 15  --  18* 16*     Hematology Recent Labs  Lab 12/10/23 1004 12/10/23 1711 12/11/23 0352  WBC 5.3 5.8 4.1  RBC 2.56* 2.37* 2.38*  HGB 7.9* 7.4* 7.4*  HCT 25.5* 23.1* 23.7*  MCV 99.6 97.5 99.6  MCH 30.9 31.2 31.1  MCHC 31.0 32.0 31.2  RDW 15.9* 15.8* 15.9*  PLT  130* 134* 134*    Cardiac EnzymesNo results for input(s): TROPONINI in the last 168 hours. No results for input(s): TROPIPOC in the last 168 hours.   BNP Recent Labs  Lab 12/09/23 2221  BNP 2,729.6*     DDimer No results for input(s): DDIMER in the last 168 hours.   Radiology    ECHOCARDIOGRAM LIMITED Result Date: 12/10/2023    ECHOCARDIOGRAM LIMITED REPORT   Patient Name:   Kirk [name] Date of Exam: 12/10/2023 Medical Rec #:  [id number]       Height:       67.0 in Accession #:    [id number]      Weight:       180.8 lb Date of Birth:  [birth year]        BSA:          1.937 m Patient Age:    63 years        BP:           147/86 mmHg Patient Gender: M  HR:           106 bpm. Exam Location:  Inpatient Procedure: Cardiac Doppler, Limited Echo and Limited Color Doppler (Both            Spectral and Color Flow Doppler were utilized during procedure). Indications:    NSTEMI I21.4  History:        Patient has prior history of Echocardiogram examinations, most                 recent 10/05/2023. CHF and Cardiomyopathy, COPD and ESRD; Risk                 Factors:Hypertension, Sleep Apnea, Diabetes and Dyslipidemia.  Sonographer:    Koleen Popper RDCS Referring Phys: [id number] TERRY SAILOR HALL  Sonographer Comments: Image acquisition challenging due to respiratory motion. IMPRESSIONS  1. Left ventricular ejection fraction, by estimation, is 25 to 30%. The left ventricle has severely decreased function. The left ventricle demonstrates global hypokinesis. The left ventricular internal cavity size was severely dilated. Indeterminate diastolic filling due to E-A fusion.  2. Right ventricular systolic function is low normal. The right ventricular size is normal.  3. Left atrial size was mildly dilated.  4. Mild mitral valve regurgitation. Moderate mitral annular calcification.  5. AV leaflets difficult to identify The valve is thickened, calcified Peak and mean gradients through the valve are 32 and  17 mm Hg respectively. Dimensionless valve index is 0.35 Overall consistent with moderate AS. Compared to echo from Sept 2025, mean gradient is mildly decreased (22 to 17 mm HG).. The aortic valve has an indeterminant number of cusps. Aortic valve regurgitation is not visualized. Comparison(s): The left ventricular function is unchanged. FINDINGS  Left Ventricle: Left ventricular ejection fraction, by estimation, is 25 to 30%. The left ventricle has severely decreased function. The left ventricle demonstrates global hypokinesis. The left ventricular internal cavity size was severely dilated. There is no left ventricular hypertrophy. Indeterminate diastolic filling due to E-A fusion. Right Ventricle: The right ventricular size is normal. Right vetricular wall thickness was not assessed. Right ventricular systolic function is low normal. Left Atrium: Left atrial size was mildly dilated. Right Atrium: Right atrial size was normal in size. Pericardium: There is no evidence of pericardial effusion. Mitral Valve: There is mild thickening of the mitral valve leaflet(s). Moderate mitral annular calcification. Mild mitral valve regurgitation. MV peak gradient, 8.5 mmHg. The mean mitral valve gradient is 4.0 mmHg. Tricuspid Valve: The tricuspid valve is normal in structure. Tricuspid valve regurgitation is mild. Aortic Valve: AV leaflets difficult to identify The valve is thickened, calcified Peak and mean gradients through the valve are 32 and 17 mm Hg respectively. Dimensionless valve index is 0.35 Overall consistent with moderate AS. Compared to echo from Sept 2025, mean gradient is mildly decreased (22 to 17 mm HG). The aortic valve has an indeterminant number of cusps. Aortic valve regurgitation is not visualized. Aortic valve mean gradient measures 17.0 mmHg. Aortic valve peak gradient measures 31.4 mmHg. IAS/Shunts: No atrial level shunt detected by color flow Doppler. Additional Comments: Spectral Doppler performed.  Color Doppler performed.  LEFT VENTRICLE PLAX 2D LVIDd:         6.20 cm Diastology LVIDs:         5.40 cm LV e' medial:    7.18 cm/s LV PW:         1.00 cm LV E/e' medial:  17.0 LV IVS:        0.80 cm LV e' lateral:  7.07 cm/s                        LV E/e' lateral: 17.3  RIGHT VENTRICLE             IVC RV S prime:     14.80 cm/s  IVC diam: 3.00 cm TAPSE (M-mode): 2.0 cm LEFT ATRIUM           Index LA diam:      4.40 cm 2.27 cm/m LA Vol (A4C): 66.8 ml 34.48 ml/m  AORTIC VALVE AV Vmax:           280.33 cm/s AV Vmean:          190.000 cm/s AV VTI:            0.433 m AV Peak Grad:      31.4 mmHg AV Mean Grad:      17.0 mmHg LVOT Vmax:         100.00 cm/s LVOT Vmean:        67.100 cm/s LVOT VTI:          0.153 m LVOT/AV VTI ratio: 0.35 MITRAL VALVE MV Area (PHT): 5.66 cm     SHUNTS MV Peak grad:  8.5 mmHg     Systemic VTI: 0.15 m MV Mean grad:  4.0 mmHg MV Vmax:       1.46 m/s MV Vmean:      93.5 cm/s MV Decel Time: 134 msec MV E velocity: 122.00 cm/s MV A velocity: 83.30 cm/s MV E/A ratio:  1.46 Vina Gull MD Electronically signed by Vina Gull MD Signature Date/Time: 12/10/2023/3:26:28 PM    Final    CT Head Wo Contrast Result Date: 12/09/2023 EXAM: CT HEAD WITHOUT CONTRAST 12/09/2023 11:08:09 PM TECHNIQUE: CT of the head was performed without the administration of intravenous contrast. Automated exposure control, iterative reconstruction, and/or weight based adjustment of the mA/kV was utilized to reduce the radiation dose to as low as reasonably achievable. COMPARISON: CT head 03/16/2023. CLINICAL HISTORY: c/f poss meningitis. FINDINGS: BRAIN AND VENTRICLES: No acute hemorrhage. No evidence of acute infarct. No hydrocephalus. No extra-axial collection. No mass effect or midline shift. Atherosclerotic calcifications are present within the cavernous internal carotid and vertebral arteries. ORBITS: No acute abnormality. SINUSES: No acute abnormality. SOFT TISSUES AND SKULL: No acute soft tissue abnormality.  Surgical changes along the right temporal calvarium. No skull fracture. IMPRESSION: 1. No acute intracranial abnormality. Electronically signed by: Morgane Naveau MD 12/09/2023 11:19 PM EST RP Workstation: [id number]   DG Chest Port 1 View Result Date: 12/09/2023 CLINICAL DATA:  Questionable sepsis EXAM: PORTABLE CHEST 1 VIEW COMPARISON:  Chest x-ray 11/15/2023 FINDINGS: Heart is enlarged. There central patchy opacities in both lungs. There is no pleural effusion or pneumothorax. No acute fractures are seen. IMPRESSION: Cardiomegaly with central patchy opacities in both lungs, which may represent pulmonary edema or infection. Electronically Signed   By: Greig Pique M.D.   On: 12/09/2023 23:15    Cardiac Studies   ***  Patient Profile     63 y.o. male ***  Assessment & Plan   *** - *** *** - *** *** - *** *** - *** *** - *** *** - *** *** - *** *** - *** *** - *** *** - ***  Time spent coordinating care: *** minutes  {Are we signing off today?:[id number]}  For questions or updates, please contact [facility] Please consult [email or web address] for contact info under  SignedEmeline Calender, DO 12/11/2023, 7:05 AM

## 2023-12-11 NOTE — Progress Notes (Signed)
 Heart Failure Navigator Progress Note  Assessed for Heart & Vascular TOC clinic readiness.  Patient does not meet criteria due to ESRD on hemodialysis. NO HF TOC. .   Navigator will sign off at this time.   Stephane Haddock, BSN, Scientist, clinical (histocompatibility and immunogenetics) Only

## 2023-12-11 NOTE — ED Notes (Signed)
 Patient transported to dialysis

## 2023-12-11 NOTE — Progress Notes (Signed)
 PROGRESS NOTE    Kirk [name]  FMW:[id number] DOB: 1961-01-06 DOA: 12/09/2023 PCP: Leonarda Roxan [name], NP   Brief Narrative:  Kirk [name] is a 63 y.o. 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  He did complete 4 hours of hemodialysis.  His pain was, 10 out of 10 and associated with shortness of breath.  Worse when taking deep breath.  EMS was activated.   In the ER, he received full dose aspirin  324 mg x 1.  His chest pain persisted.  His twelve-lead EKG showed no evidence of acute ischemia.  High-sensitivity troponin 153 with delta, repeat 1098.  EDP discussed the case with cardiology recommended heparin  drip and admit for possible NSTEMI.    Assessment & Plan:   Principal Problem:   NSTEMI (non-ST elevated myocardial infarction) [facility]) Active Problems:   Chest pain  History of severe low-flow low gradient aortic stenosis/elevated troponin/?  NSTEMI: Came in with typical chest pain, no EKG changes however troponins jumped from 153> 1098> 5953> 5620.  Patient started on nitro as well as heparin  drip per cardiology.  Cardiology opines that this is likely demand ischemia.  Echo shows reduced ejection fracture with global hypokinesis which is no change from previous echo done 2 months ago.  Also, patient recently had CT coronary about 3 weeks ago which showed diffuse aortic atherosclerosis.  Management deferred to cardiology.  Sepsis secondary to gram-positive bacteremia, not POA: Upon arrival, patient did not have any signs or symptoms of sepsis.  After admission, patient spiked fever with Tmax of 102.9 at around 4 PM on 12/10/2023 along with tachycardia and tachypnea and blood culture was positive with gram-positive cocci.  Final  sensitivities and identification pending.  Patient started on Rocephin .  Last temperature spike of 100.9 at 7 PM 12/10/2023.  ESRD on HD/volume overload: Upon arrival to ED, patient received IV fluid bolus and was continued on slow IV fluids.  Typically receives dialysis on Monday Wednesday Friday schedule, due to volume overload, patient received dialysis on Tuesday.  Nephrology managing.  Gram-positive bacteremia  History of paroxysmal atrial fibrillation: On Toprol -XL 12.5.  Rate still elevated.  Not on any anticoagulation previously.  Currently on heparin .  Will defer to cardiology.  Chronic combined systolic and diastolic congestive heart failure: Volume management by dialysis.  Essential hypertension: Controlled, continue Toprol -XL.  Hyperlipidemia: Continue statin.  QTc prolongation: Upon admission, QTc was 542.  Optimize electrolytes and await QTc prolonging medications and monitor on telemetry.  DVT prophylaxis: Heparin    Code Status: Full Code  Family Communication:  None present at bedside.  Plan of care discussed with patient in length and he/she verbalized understanding and agreed with it.  Status is: Inpatient Remains inpatient appropriate because: Still on heparin  and nitroglycerin  drip, pending further evaluation by cardiology.  May need dialysis today.   Estimated body mass index is 30.04 kg/m as calculated from the following:   Height as of this encounter: 5' 7 (1.702 m).   Weight as of this encounter: 87 kg.    Nutritional Assessment: Body mass index is 30.04 kg/m.SABRA Seen by dietician.  I agree with the assessment and plan as outlined below: Nutrition Status:        . Skin Assessment: I have examined the patient's skin and I agree with  the wound assessment as performed by the wound care RN as outlined below:    Consultants:  Nephrology and cardiology  Procedures:  As above  Antimicrobials:  Anti-infectives (From admission, onward)    Start      Dose/Rate Route Frequency Ordered Stop   12/10/23 2200  cefTRIAXone  (ROCEPHIN ) 2 g in sodium chloride  0.9 % 100 mL IVPB        2 g 200 mL/hr over 30 Minutes Intravenous Every 24 hours 12/10/23 1519     12/09/23 2200  vancomycin  (VANCOCIN ) IVPB 1000 mg/200 mL premix  Status:  Discontinued        1,000 mg 200 mL/hr over 60 Minutes Intravenous  Once 12/09/23 2151 12/09/23 2153   12/09/23 2200  ceFEPIme  (MAXIPIME ) 2 g in sodium chloride  0.9 % 100 mL IVPB        2 g 200 mL/hr over 30 Minutes Intravenous  Once 12/09/23 2151 12/09/23 2337   12/09/23 2200  metroNIDAZOLE  (FLAGYL ) IVPB 500 mg        500 mg 100 mL/hr over 60 Minutes Intravenous  Once 12/09/23 2151 12/09/23 2342   12/09/23 2200  vancomycin  (VANCOREADY) IVPB 1750 mg/350 mL        1,750 mg 175 mL/hr over 120 Minutes Intravenous  Once 12/09/23 2153 12/10/23 0142         Subjective: Patient seen and examined, still in the ED.  Complains of chest pain but improved compared to yesterday.  Still with shortness of breath but more like exertional today as compared to at rest yesterday.  Overall feels improved.  Still has bilateral basal crackles.  I wonder if he would benefit from another dialysis today which will put him back on the schedule as well.  Defer to nephrology.  Objective: Vitals:   12/11/23 0400 12/11/23 0430 12/11/23 0530 12/11/23 0830  BP:  (!) 146/91 (!) 149/65 (!) 157/78  Pulse:  99 95 100  Resp:  (!) 27 20 (!) 24  Temp: 98.6 F (37 C)     TempSrc: Oral     SpO2:  100% 98% 100%  Weight:      Height:        Intake/Output Summary (Last 24 hours) at 12/11/2023 0853 Last data filed at 12/10/2023 2226 Gross per 24 hour  Intake 101.92 ml  Output 3000 ml  Net -2898.08 ml   Filed Weights   12/09/23 2124 12/10/23 1205  Weight: 82 kg 87 kg    Examination:  General exam: Appears calm and comfortable  Respiratory system: Bibasilar crackles. Respiratory effort normal. Cardiovascular system: S1 & S2 heard, RRR. No  JVD, murmurs, rubs, gallops or clicks.  +1 pitting edema bilateral lower extremity Gastrointestinal system: Abdomen is nondistended, soft and nontender. No organomegaly or masses felt. Normal bowel sounds heard. Central nervous system: Alert and oriented. No focal neurological deficits. Extremities: Symmetric 5 x 5 power. Skin: No rashes, lesions or ulcers.  Psychiatry: Judgement and insight appear normal. Mood & affect appropriate.    Data Reviewed: I have personally reviewed following labs and imaging studies  CBC: Recent Labs  Lab 12/09/23 2131 12/09/23 2228 12/10/23 1004 12/10/23 1711 12/11/23 0352  WBC 5.4  --  5.3 5.8 4.1  NEUTROABS  --   --   --  4.9 2.9  HGB 7.3* 8.2* 7.9* 7.4* 7.4*  HCT 22.6* 24.0* 25.5* 23.1* 23.7*  MCV 97.0  --  99.6 97.5 99.6  PLT 128*  --  130* 134* 134*   Basic Metabolic  Panel: Recent Labs  Lab 12/09/23 2131 12/09/23 2228 12/10/23 1004 12/11/23 0352  NA 135 136 136 134*  K 3.4* 3.7 4.8 4.2  CL 92* 93* 93* 93*  CO2 28  --  25 25  GLUCOSE 136* 113* 121* 149*  BUN 13 15 20  27*  CREATININE 5.37* 5.60* 6.47* 5.90*  CALCIUM  8.2*  --  8.3* 8.6*  MG  --   --  1.9  --   PHOS  --   --  4.4 4.6   GFR: Estimated Creatinine Clearance: 13.9 mL/min (A) (by C-G formula based on SCr of 5.9 mg/dL (H)). Liver Function Tests: Recent Labs  Lab 12/10/23 1004 12/11/23 0352  ALBUMIN  2.8* 2.6*   No results for input(s): LIPASE, AMYLASE in the last 168 hours. No results for input(s): AMMONIA in the last 168 hours. Coagulation Profile: Recent Labs  Lab 12/09/23 2221  INR 1.1   Cardiac Enzymes: No results for input(s): CKTOTAL, CKMB, CKMBINDEX, TROPONINI in the last 168 hours. BNP (last 3 results) No results for input(s): PROBNP in the last 8760 hours. HbA1C: No results for input(s): HGBA1C in the last 72 hours. CBG: Recent Labs  Lab 12/10/23 0929  GLUCAP 112*   Lipid Profile: No results for input(s): CHOL, HDL,  LDLCALC, TRIG, CHOLHDL, LDLDIRECT in the last 72 hours. Thyroid  Function Tests: No results for input(s): TSH, T4TOTAL, FREET4, T3FREE, THYROIDAB in the last 72 hours. Anemia Panel: No results for input(s): VITAMINB12, FOLATE, FERRITIN, TIBC, IRON, RETICCTPCT in the last 72 hours. Sepsis Labs: Recent Labs  Lab 12/09/23 2228  LATICACIDVEN 1.0    Recent Results (from the past 240 hours)  Resp panel by RT-PCR (RSV, Flu A&B, Covid) Anterior Nasal Swab     Status: None   Collection Time: 12/09/23 10:21 PM   Specimen: Anterior Nasal Swab  Result Value Ref Range Status   SARS Coronavirus 2 by RT PCR NEGATIVE NEGATIVE Final   Influenza A by PCR NEGATIVE NEGATIVE Final   Influenza B by PCR NEGATIVE NEGATIVE Final    Comment: (NOTE) The Xpert Xpress SARS-CoV-2/FLU/RSV plus assay is intended as an aid in the diagnosis of influenza from Nasopharyngeal swab specimens and should not be used as a sole basis for treatment. Nasal washings and aspirates are unacceptable for Xpert Xpress SARS-CoV-2/FLU/RSV testing.  Fact Sheet for Patients: bloggercourse.com  Fact Sheet for Healthcare Providers: seriousbroker.it  This test is not yet approved or cleared by the [place]  FDA and has been authorized for detection and/or diagnosis of SARS-CoV-2 by FDA under an Emergency Use Authorization (EUA). This EUA will remain in effect (meaning this test can be used) for the duration of the COVID-19 declaration under Section 564(b)(1) of the Act, 21 U.S.C. section 360bbb-3(b)(1), unless the authorization is terminated or revoked.     Resp Syncytial Virus by PCR NEGATIVE NEGATIVE Final    Comment: (NOTE) Fact Sheet for Patients: bloggercourse.com  Fact Sheet for Healthcare Providers: seriousbroker.it  This test is not yet approved or cleared by the [place]  FDA  and has been authorized for detection and/or diagnosis of SARS-CoV-2 by FDA under an Emergency Use Authorization (EUA). This EUA will remain in effect (meaning this test can be used) for the duration of the COVID-19 declaration under Section 564(b)(1) of the Act, 21 U.S.C. section 360bbb-3(b)(1), unless the authorization is terminated or revoked.  Performed at [facility], [address] [place], [place] [address]   Blood Culture (routine x 2)     Status:  None (Preliminary result)   Collection Time: 12/09/23 10:21 PM   Specimen: BLOOD RIGHT ARM  Result Value Ref Range Status   Specimen Description BLOOD RIGHT ARM  Final   Special Requests   Final    BOTTLES DRAWN AEROBIC AND ANAEROBIC Blood Culture adequate volume   Culture  Setup Time   Final    GRAM POSITIVE COCCI IN CHAINS IN BOTH AEROBIC AND ANAEROBIC BOTTLES CRITICAL RESULT CALLED TO, READ BACK BY AND VERIFIED WITH: PHARMD J.FRENS AT 1400 ON 12/10/2023 BY T.SAAD.  Performed at [facility], [address] [place], [place] [address]    Culture GRAM POSITIVE COCCI  Final   Report Status PENDING  Incomplete  Blood Culture (routine x 2)     Status: None (Preliminary result)   Collection Time: 12/09/23 10:21 PM   Specimen: BLOOD RIGHT ARM  Result Value Ref Range Status   Specimen Description BLOOD RIGHT ARM  Final   Special Requests   Final    BOTTLES DRAWN AEROBIC AND ANAEROBIC Blood Culture adequate volume   Culture  Setup Time   Final    GRAM POSITIVE COCCI IN CHAINS IN BOTH AEROBIC AND ANAEROBIC BOTTLES CRITICAL VALUE NOTED.  VALUE IS CONSISTENT WITH PREVIOUSLY REPORTED AND CALLED VALUE. Performed at [facility], [address] [place], [place] [address]    Culture GRAM POSITIVE COCCI  Final   Report Status PENDING  Incomplete  Blood Culture ID Panel (Reflexed)     Status: Abnormal   Collection Time: 12/09/23 10:21 PM  Result Value Ref Range Status   Enterococcus faecalis NOT DETECTED NOT  DETECTED Final   Enterococcus Faecium NOT DETECTED NOT DETECTED Final   Listeria monocytogenes NOT DETECTED NOT DETECTED Final   Staphylococcus species NOT DETECTED NOT DETECTED Final   Staphylococcus aureus (BCID) NOT DETECTED NOT DETECTED Final   Staphylococcus epidermidis NOT DETECTED NOT DETECTED Final   Staphylococcus lugdunensis NOT DETECTED NOT DETECTED Final   Streptococcus species DETECTED (A) NOT DETECTED Final    Comment: Not Enterococcus species, Streptococcus agalactiae, Streptococcus pyogenes, or Streptococcus pneumoniae. CRITICAL RESULT CALLED TO, READ BACK BY AND VERIFIED WITH: PHARMD J.FRENS AT 1400 ON 12/10/2023 BY T.SAAD.     Streptococcus agalactiae NOT DETECTED NOT DETECTED Final   Streptococcus pneumoniae NOT DETECTED NOT DETECTED Final   Streptococcus pyogenes NOT DETECTED NOT DETECTED Final   A.calcoaceticus-baumannii NOT DETECTED NOT DETECTED Final   Bacteroides fragilis NOT DETECTED NOT DETECTED Final   Enterobacterales NOT DETECTED NOT DETECTED Final   Enterobacter cloacae complex NOT DETECTED NOT DETECTED Final   Escherichia coli NOT DETECTED NOT DETECTED Final   Klebsiella aerogenes NOT DETECTED NOT DETECTED Final   Klebsiella oxytoca NOT DETECTED NOT DETECTED Final   Klebsiella pneumoniae NOT DETECTED NOT DETECTED Final   Proteus species NOT DETECTED NOT DETECTED Final   Salmonella species NOT DETECTED NOT DETECTED Final   Serratia marcescens NOT DETECTED NOT DETECTED Final   Haemophilus influenzae NOT DETECTED NOT DETECTED Final   Neisseria meningitidis NOT DETECTED NOT DETECTED Final   Pseudomonas aeruginosa NOT DETECTED NOT DETECTED Final   Stenotrophomonas maltophilia NOT DETECTED NOT DETECTED Final   Candida albicans NOT DETECTED NOT DETECTED Final   Candida auris NOT DETECTED NOT DETECTED Final   Candida glabrata NOT DETECTED NOT DETECTED Final   Candida krusei NOT DETECTED NOT DETECTED Final   Candida parapsilosis NOT DETECTED NOT DETECTED Final    Candida tropicalis NOT DETECTED NOT DETECTED Final   Cryptococcus neoformans/gattii  NOT DETECTED NOT DETECTED Final    Comment: Performed at [name], [address], [place], [place] [address]     Radiology Studies: ECHOCARDIOGRAM LIMITED Result Date: 12/10/2023    ECHOCARDIOGRAM LIMITED REPORT   Patient Name:   Kirk [name] Date of Exam: 12/10/2023 Medical Rec #:  [id number]       Height:       67.0 in Accession #:    [id number]      Weight:       180.8 lb Date of Birth:  08-30-1960        BSA:          1.937 m Patient Age:    63 years        BP:           147/86 mmHg Patient Gender: M               HR:           106 bpm. Exam Location:  Inpatient Procedure: Cardiac Doppler, Limited Echo and Limited Color Doppler (Both            Spectral and Color Flow Doppler were utilized during procedure). Indications:    NSTEMI I21.4  History:        Patient has prior history of Echocardiogram examinations, most                 recent 10/05/2023. CHF and Cardiomyopathy, COPD and ESRD; Risk                 Factors:Hypertension, Sleep Apnea, Diabetes and Dyslipidemia.  Sonographer:    Koleen Popper RDCS Referring Phys: [id number] TERRY SAILOR HALL  Sonographer Comments: Image acquisition challenging due to respiratory motion. IMPRESSIONS  1. Left ventricular ejection fraction, by estimation, is 25 to 30%. The left ventricle has severely decreased function. The left ventricle demonstrates global hypokinesis. The left ventricular internal cavity size was severely dilated. Indeterminate diastolic filling due to E-A fusion.  2. Right ventricular systolic function is low normal. The right ventricular size is normal.  3. Left atrial size was mildly dilated.  4. Mild mitral valve regurgitation. Moderate mitral annular calcification.  5. AV leaflets difficult to identify The valve is thickened, calcified Peak and mean gradients through the valve are 32 and 17 mm Hg respectively. Dimensionless valve index is 0.35 Overall  consistent with moderate AS. Compared to echo from Sept 2025, mean gradient is mildly decreased (22 to 17 mm HG).. The aortic valve has an indeterminant number of cusps. Aortic valve regurgitation is not visualized. Comparison(s): The left ventricular function is unchanged. FINDINGS  Left Ventricle: Left ventricular ejection fraction, by estimation, is 25 to 30%. The left ventricle has severely decreased function. The left ventricle demonstrates global hypokinesis. The left ventricular internal cavity size was severely dilated. There is no left ventricular hypertrophy. Indeterminate diastolic filling due to E-A fusion. Right Ventricle: The right ventricular size is normal. Right vetricular wall thickness was not assessed. Right ventricular systolic function is low normal. Left Atrium: Left atrial size was mildly dilated. Right Atrium: Right atrial size was normal in size. Pericardium: There is no evidence of pericardial effusion. Mitral Valve: There is mild thickening of the mitral valve leaflet(s). Moderate mitral annular calcification. Mild mitral valve regurgitation. MV peak gradient, 8.5 mmHg. The mean mitral valve gradient is 4.0 mmHg. Tricuspid Valve: The tricuspid valve is normal in structure. Tricuspid valve regurgitation is mild. Aortic Valve: AV leaflets difficult to identify  The valve is thickened, calcified Peak and mean gradients through the valve are 32 and 17 mm Hg respectively. Dimensionless valve index is 0.35 Overall consistent with moderate AS. Compared to echo from Sept 2025, mean gradient is mildly decreased (22 to 17 mm HG). The aortic valve has an indeterminant number of cusps. Aortic valve regurgitation is not visualized. Aortic valve mean gradient measures 17.0 mmHg. Aortic valve peak gradient measures 31.4 mmHg. IAS/Shunts: No atrial level shunt detected by color flow Doppler. Additional Comments: Spectral Doppler performed. Color Doppler performed.  LEFT VENTRICLE PLAX 2D LVIDd:          6.20 cm Diastology LVIDs:         5.40 cm LV e' medial:    7.18 cm/s LV PW:         1.00 cm LV E/e' medial:  17.0 LV IVS:        0.80 cm LV e' lateral:   7.07 cm/s                        LV E/e' lateral: 17.3  RIGHT VENTRICLE             IVC RV S prime:     14.80 cm/s  IVC diam: 3.00 cm TAPSE (M-mode): 2.0 cm LEFT ATRIUM           Index LA diam:      4.40 cm 2.27 cm/m LA Vol (A4C): 66.8 ml 34.48 ml/m  AORTIC VALVE AV Vmax:           280.33 cm/s AV Vmean:          190.000 cm/s AV VTI:            0.433 m AV Peak Grad:      31.4 mmHg AV Mean Grad:      17.0 mmHg LVOT Vmax:         100.00 cm/s LVOT Vmean:        67.100 cm/s LVOT VTI:          0.153 m LVOT/AV VTI ratio: 0.35 MITRAL VALVE MV Area (PHT): 5.66 cm     SHUNTS MV Peak grad:  8.5 mmHg     Systemic VTI: 0.15 m MV Mean grad:  4.0 mmHg MV Vmax:       1.46 m/s MV Vmean:      93.5 cm/s MV Decel Time: 134 msec MV E velocity: 122.00 cm/s MV A velocity: 83.30 cm/s MV E/A ratio:  1.46 Vina Gull MD Electronically signed by Vina Gull MD Signature Date/Time: 12/10/2023/3:26:28 PM    Final    CT Head Wo Contrast Result Date: 12/09/2023 EXAM: CT HEAD WITHOUT CONTRAST 12/09/2023 11:08:09 PM TECHNIQUE: CT of the head was performed without the administration of intravenous contrast. Automated exposure control, iterative reconstruction, and/or weight based adjustment of the mA/kV was utilized to reduce the radiation dose to as low as reasonably achievable. COMPARISON: CT head 03/16/2023. CLINICAL HISTORY: c/f poss meningitis. FINDINGS: BRAIN AND VENTRICLES: No acute hemorrhage. No evidence of acute infarct. No hydrocephalus. No extra-axial collection. No mass effect or midline shift. Atherosclerotic calcifications are present within the cavernous internal carotid and vertebral arteries. ORBITS: No acute abnormality. SINUSES: No acute abnormality. SOFT TISSUES AND SKULL: No acute soft tissue abnormality. Surgical changes along the right temporal calvarium. No skull  fracture. IMPRESSION: 1. No acute intracranial abnormality. Electronically signed by: Morgane Naveau MD 12/09/2023 11:19 PM EST RP Workstation: [id number]   DG Chest  Port 1 View Result Date: 12/09/2023 CLINICAL DATA:  Questionable sepsis EXAM: PORTABLE CHEST 1 VIEW COMPARISON:  Chest x-ray 11/15/2023 FINDINGS: Heart is enlarged. There central patchy opacities in both lungs. There is no pleural effusion or pneumothorax. No acute fractures are seen. IMPRESSION: Cardiomegaly with central patchy opacities in both lungs, which may represent pulmonary edema or infection. Electronically Signed   By: Greig Pique M.D.   On: 12/09/2023 23:15    Scheduled Meds:  aspirin   324 mg Oral Once   aspirin  EC  81 mg Oral Daily   Chlorhexidine  Gluconate Cloth  6 each Topical Q0600   metoprolol  succinate  12.5 mg Oral Daily   pravastatin   40 mg Oral QHS   Continuous Infusions:  cefTRIAXone  (ROCEPHIN )  IV Stopped (12/10/23 2226)   heparin  1,400 Units/hr (12/11/23 9391)   nitroGLYCERIN  10 mcg/min (12/10/23 1655)     LOS: 1 day   Fredia Skeeter, MD [facility]  12/11/2023, 8:53 AM   *Please note that this is a verbal dictation therefore any spelling or grammatical errors are due to the Dragon Medical One system interpretation.  Please page via Amion and do not message via secure chat for urgent patient care matters. Secure chat can be used for non urgent patient care matters.  How to contact the [facility] Attending or Consulting provider 7A - 7P or covering provider during after hours 7P -7A, for this patient?  Check the care team in [facility] and look for a) attending/consulting [facility] provider listed and b) the [facility] team listed. Page or secure chat 7A-7P. Log into [email or web address] and use [facility]'s universal password to access. If you do not have the password, please contact the hospital operator. Locate the [facility] provider you are looking for under [facility] and page to a number that you can be directly  reached. If you still have difficulty reaching the provider, please page the [facility] (Director on Call) for the Hospitalists listed on amion for assistance.

## 2023-12-11 NOTE — Progress Notes (Signed)
 PHARMACY - ANTICOAGULATION  Pharmacy Consult for Heparin   Indication: chest pain/ACS Brief A/P: Heparin  level subtherapeutic Increase Heparin  rate  Allergies  Allergen Reactions   Codeine Nausea And Vomiting   Lortab [Hydrocodone -Acetaminophen ] Nausea And Vomiting    Patient Measurements: Height: 5' 7 (170.2 cm) Weight: 87 kg (191 lb 12.8 oz) IBW/kg (Calculated) : 66.1 HEPARIN  DW (KG): 82  Vital Signs: Temp: 98.6 F (37 C) (11/19 0400) Temp Source: Oral (11/19 0400) BP: 146/91 (11/19 0430) Pulse Rate: 99 (11/19 0430)  Labs: Recent Labs    12/09/23 2131 12/09/23 2221 12/09/23 2228 12/09/23 2348 12/10/23 1004 12/10/23 1711 12/10/23 2000 12/11/23 0352  HGB 7.3*  --  8.2*  --  7.9* 7.4*  --  7.4*  HCT 22.6*  --  24.0*  --  25.5* 23.1*  --  23.7*  PLT 128*  --   --   --  130* 134*  --  134*  LABPROT  --  15.3*  --   --   --   --   --   --   INR  --  1.1  --   --   --   --   --   --   HEPARINUNFRC  --   --   --   --  0.10*  --  0.68 <0.10*  CREATININE 5.37*  --  5.60*  --  6.47*  --   --   --   TROPONINIHS 153*  --   --  1,098* 5,953* 5,620*  --   --     Estimated Creatinine Clearance: 12.6 mL/min (A) (by C-G formula based on SCr of 6.47 mg/dL (H)).  Assessment: 63 y.o. male with chest pain for heparin    Goal of Therapy:  Heparin  level 0.3-0.7 units/ml Monitor platelets by anticoagulation protocol: Yes   Plan:  Increase Heparin  1400 units/hr Check heparin  level in 6 hours.  Cathlyn Arrant, PharmD, BCPS

## 2023-12-11 NOTE — Progress Notes (Signed)
 Progress Note  Patient Name: Kirk [name] Date of Encounter: 12/11/2023  Primary Cardiologist: Vinie [name], MD   Subjective   Still short of breath and complaining of persistent chest pain. Troponin trending down.   Inpatient Medications    Scheduled Meds:  aspirin   324 mg Oral Once   aspirin  EC  81 mg Oral Daily   Chlorhexidine  Gluconate Cloth  6 each Topical Q0600   metoprolol  succinate  12.5 mg Oral Daily   pravastatin   40 mg Oral QHS   Continuous Infusions:  cefTRIAXone  (ROCEPHIN )  IV Stopped (12/10/23 2226)   heparin  1,400 Units/hr (12/11/23 9391)   nitroGLYCERIN  10 mcg/min (12/10/23 1655)   PRN Meds: acetaminophen , fentaNYL  (SUBLIMAZE ) injection, melatonin, polyethylene glycol, prochlorperazine    Vital Signs    Vitals:   12/11/23 0530 12/11/23 0830 12/11/23 0918 12/11/23 1000  BP: (!) 149/65 (!) 157/78 137/75 (!) 157/86  Pulse: 95 100 (!) 104 98  Resp: 20 (!) 24  (!) 29  Temp:      TempSrc:      SpO2: 98% 100%  100%  Weight:      Height:        Intake/Output Summary (Last 24 hours) at 12/11/2023 1145 Last data filed at 12/10/2023 2226 Gross per 24 hour  Intake 101.92 ml  Output 3000 ml  Net -2898.08 ml   Filed Weights   12/09/23 2124 12/10/23 1205  Weight: 82 kg 87 kg    Telemetry    Sinus rhythm  - Personally Reviewed  ECG    Sinus tachycardia with prolonged QT - Personally Reviewed  Physical Exam   Physical Exam Vitals and nursing note reviewed.  Constitutional:      Appearance: Normal appearance.  HENT:     Head: Normocephalic and atraumatic.  Eyes:     Conjunctiva/sclera: Conjunctivae normal.  Cardiovascular:     Rate and Rhythm: Normal rate and regular rhythm.     Heart sounds: Murmur heard.  Pulmonary:     Effort: Pulmonary effort is normal.     Breath sounds: Rales present.  Skin:    Coloration: Skin is not jaundiced or pale.  Neurological:     Mental Status: He is alert.      Labs    Chemistry Recent  Labs  Lab 12/09/23 2131 12/09/23 2228 12/10/23 1004 12/11/23 0352  NA 135 136 136 134*  K 3.4* 3.7 4.8 4.2  CL 92* 93* 93* 93*  CO2 28  --  25 25  GLUCOSE 136* 113* 121* 149*  BUN 13 15 20  27*  CREATININE 5.37* 5.60* 6.47* 5.90*  CALCIUM  8.2*  --  8.3* 8.6*  ALBUMIN   --   --  2.8* 2.6*  GFRNONAA 11*  --  9* 10*  ANIONGAP 15  --  18* 16*     Hematology Recent Labs  Lab 12/10/23 1004 12/10/23 1711 12/11/23 0352  WBC 5.3 5.8 4.1  RBC 2.56* 2.37* 2.38*  HGB 7.9* 7.4* 7.4*  HCT 25.5* 23.1* 23.7*  MCV 99.6 97.5 99.6  MCH 30.9 31.2 31.1  MCHC 31.0 32.0 31.2  RDW 15.9* 15.8* 15.9*  PLT 130* 134* 134*    Cardiac EnzymesNo results for input(s): TROPONINI in the last 168 hours. No results for input(s): TROPIPOC in the last 168 hours.   BNP Recent Labs  Lab 12/09/23 2221  BNP 2,729.6*     DDimer No results for input(s): DDIMER in the last 168 hours.   Radiology    ECHOCARDIOGRAM LIMITED  Result Date: 12/10/2023    ECHOCARDIOGRAM LIMITED REPORT   Patient Name:   Kirk [name] Date of Exam: 12/10/2023 Medical Rec #:  [id number]       Height:       67.0 in Accession #:    [id number]      Weight:       180.8 lb Date of Birth:  [birth year]        BSA:          1.937 m Patient Age:    63 years        BP:           147/86 mmHg Patient Gender: M               HR:           106 bpm. Exam Location:  Inpatient Procedure: Cardiac Doppler, Limited Echo and Limited Color Doppler (Both            Spectral and Color Flow Doppler were utilized during procedure). Indications:    NSTEMI I21.4  History:        Patient has prior history of Echocardiogram examinations, most                 recent 10/05/2023. CHF and Cardiomyopathy, COPD and ESRD; Risk                 Factors:Hypertension, Sleep Apnea, Diabetes and Dyslipidemia.  Sonographer:    Koleen Popper RDCS Referring Phys: [id number] TERRY SAILOR HALL  Sonographer Comments: Image acquisition challenging due to respiratory motion. IMPRESSIONS  1.  Left ventricular ejection fraction, by estimation, is 25 to 30%. The left ventricle has severely decreased function. The left ventricle demonstrates global hypokinesis. The left ventricular internal cavity size was severely dilated. Indeterminate diastolic filling due to E-A fusion.  2. Right ventricular systolic function is low normal. The right ventricular size is normal.  3. Left atrial size was mildly dilated.  4. Mild mitral valve regurgitation. Moderate mitral annular calcification.  5. AV leaflets difficult to identify The valve is thickened, calcified Peak and mean gradients through the valve are 32 and 17 mm Hg respectively. Dimensionless valve index is 0.35 Overall consistent with moderate AS. Compared to echo from Sept 2025, mean gradient is mildly decreased (22 to 17 mm HG).. The aortic valve has an indeterminant number of cusps. Aortic valve regurgitation is not visualized. Comparison(s): The left ventricular function is unchanged. FINDINGS  Left Ventricle: Left ventricular ejection fraction, by estimation, is 25 to 30%. The left ventricle has severely decreased function. The left ventricle demonstrates global hypokinesis. The left ventricular internal cavity size was severely dilated. There is no left ventricular hypertrophy. Indeterminate diastolic filling due to E-A fusion. Right Ventricle: The right ventricular size is normal. Right vetricular wall thickness was not assessed. Right ventricular systolic function is low normal. Left Atrium: Left atrial size was mildly dilated. Right Atrium: Right atrial size was normal in size. Pericardium: There is no evidence of pericardial effusion. Mitral Valve: There is mild thickening of the mitral valve leaflet(s). Moderate mitral annular calcification. Mild mitral valve regurgitation. MV peak gradient, 8.5 mmHg. The mean mitral valve gradient is 4.0 mmHg. Tricuspid Valve: The tricuspid valve is normal in structure. Tricuspid valve regurgitation is mild. Aortic  Valve: AV leaflets difficult to identify The valve is thickened, calcified Peak and mean gradients through the valve are 32 and 17 mm Hg respectively. Dimensionless valve index is 0.35 Overall consistent with moderate AS.  Compared to echo from Sept 2025, mean gradient is mildly decreased (22 to 17 mm HG). The aortic valve has an indeterminant number of cusps. Aortic valve regurgitation is not visualized. Aortic valve mean gradient measures 17.0 mmHg. Aortic valve peak gradient measures 31.4 mmHg. IAS/Shunts: No atrial level shunt detected by color flow Doppler. Additional Comments: Spectral Doppler performed. Color Doppler performed.  LEFT VENTRICLE PLAX 2D LVIDd:         6.20 cm Diastology LVIDs:         5.40 cm LV e' medial:    7.18 cm/s LV PW:         1.00 cm LV E/e' medial:  17.0 LV IVS:        0.80 cm LV e' lateral:   7.07 cm/s                        LV E/e' lateral: 17.3  RIGHT VENTRICLE             IVC RV S prime:     14.80 cm/s  IVC diam: 3.00 cm TAPSE (M-mode): 2.0 cm LEFT ATRIUM           Index LA diam:      4.40 cm 2.27 cm/m LA Vol (A4C): 66.8 ml 34.48 ml/m  AORTIC VALVE AV Vmax:           280.33 cm/s AV Vmean:          190.000 cm/s AV VTI:            0.433 m AV Peak Grad:      31.4 mmHg AV Mean Grad:      17.0 mmHg LVOT Vmax:         100.00 cm/s LVOT Vmean:        67.100 cm/s LVOT VTI:          0.153 m LVOT/AV VTI ratio: 0.35 MITRAL VALVE MV Area (PHT): 5.66 cm     SHUNTS MV Peak grad:  8.5 mmHg     Systemic VTI: 0.15 m MV Mean grad:  4.0 mmHg MV Vmax:       1.46 m/s MV Vmean:      93.5 cm/s MV Decel Time: 134 msec MV E velocity: 122.00 cm/s MV A velocity: 83.30 cm/s MV E/A ratio:  1.46 Vina Gull MD Electronically signed by Vina Gull MD Signature Date/Time: 12/10/2023/3:26:28 PM    Final    CT Head Wo Contrast Result Date: 12/09/2023 EXAM: CT HEAD WITHOUT CONTRAST 12/09/2023 11:08:09 PM TECHNIQUE: CT of the head was performed without the administration of intravenous contrast. Automated exposure  control, iterative reconstruction, and/or weight based adjustment of the mA/kV was utilized to reduce the radiation dose to as low as reasonably achievable. COMPARISON: CT head 03/16/2023. CLINICAL HISTORY: c/f poss meningitis. FINDINGS: BRAIN AND VENTRICLES: No acute hemorrhage. No evidence of acute infarct. No hydrocephalus. No extra-axial collection. No mass effect or midline shift. Atherosclerotic calcifications are present within the cavernous internal carotid and vertebral arteries. ORBITS: No acute abnormality. SINUSES: No acute abnormality. SOFT TISSUES AND SKULL: No acute soft tissue abnormality. Surgical changes along the right temporal calvarium. No skull fracture. IMPRESSION: 1. No acute intracranial abnormality. Electronically signed by: Morgane Naveau MD 12/09/2023 11:19 PM EST RP Workstation: [id number]   DG Chest Port 1 View Result Date: 12/09/2023 CLINICAL DATA:  Questionable sepsis EXAM: PORTABLE CHEST 1 VIEW COMPARISON:  Chest x-ray 11/15/2023 FINDINGS: Heart is enlarged. There central patchy opacities in  both lungs. There is no pleural effusion or pneumothorax. No acute fractures are seen. IMPRESSION: Cardiomegaly with central patchy opacities in both lungs, which may represent pulmonary edema or infection. Electronically Signed   By: Greig Pique M.D.   On: 12/09/2023 23:15    Cardiac Studies     1. Left ventricular ejection fraction, by estimation, is 25 to 30%. The  left ventricle has severely decreased function. The left ventricle  demonstrates global hypokinesis. The left ventricular internal cavity size  was severely dilated. Indeterminate  diastolic filling due to E-A fusion.   2. Right ventricular systolic function is low normal. The right  ventricular size is normal.   3. Left atrial size was mildly dilated.   4. Mild mitral valve regurgitation. Moderate mitral annular  calcification.   5. AV leaflets difficult to identify The valve is thickened, calcified  Peak and  mean gradients through the valve are 32 and 17 mm Hg  respectively. Dimensionless valve index is 0.35 Overall consistent with  moderate AS. Compared to echo from Sept 2025,  mean gradient is mildly decreased (22 to 17 mm HG).. The aortic valve has  an indeterminant number of cusps. Aortic valve regurgitation is not  visualized.   Comparison(s): The left ventricular function is unchanged.    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 He had dialysis earlier in the day and noted to have low blood pressures and had about 2 L removed. He had sudden onset substernal nonradiating chest pain following dialysis along with palpitations.  Elevated troponin and chest discomfort was likely due to demand ischemia and possibly low blood pressure in the setting of HD for ESRD and severe low-flow low gradient aortic stenosis.  Cardiothoracic surgery was consulted and they have moved up his initially planned TAVR, now scheduled for 12/25/2023 with Dr. Daniel and Dr. Wonda.    Assessment & Plan   Elevated troponin, likely demand ischemia due to hypotension from HD for ESRD in the setting of severe low flow gradient aortic stenosis Echo unchanged from prior Minimal CAD on [facility] 10/04/23 Troponin peaked at 5953 Ok to discontinue heparin  Continue aspirin  81 mg Severe low-flow low gradient aortic stenosis Undergoing outpatient workup for AVR. Has seen Dr. Wendel on 10/14  AVR rescheduled for 12/25/23 with Dr. Daniel and Dr. Wonda Chest pain, likely due to volume  overload and aortic stenosis  currently on nitroglycerin  drip. Need to be careful in the setting of aortic stenosis but currently BP is tolerating.  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 resume home meds if BP is stable and ok with nephro. NICM Paroxysmal atrial fibrillation not on anticoagulation due to subdural hematoma Prolonged QT - optimize electrolytes. Will recheck ECG today  Time spent coordinating care: 36 minutes     For questions or updates, please contact [facility] Please consult [email or web address] for contact info under        Signed, Emeline Calender, DO 12/11/2023, 11:45  AM

## 2023-12-12 DIAGNOSIS — N186 End stage renal disease: Secondary | ICD-10-CM

## 2023-12-12 DIAGNOSIS — R7881 Bacteremia: Secondary | ICD-10-CM

## 2023-12-12 DIAGNOSIS — I350 Nonrheumatic aortic (valve) stenosis: Secondary | ICD-10-CM

## 2023-12-12 DIAGNOSIS — B954 Other streptococcus as the cause of diseases classified elsewhere: Secondary | ICD-10-CM | POA: Diagnosis not present

## 2023-12-12 DIAGNOSIS — I214 Non-ST elevation (NSTEMI) myocardial infarction: Secondary | ICD-10-CM | POA: Diagnosis not present

## 2023-12-12 DIAGNOSIS — Z992 Dependence on renal dialysis: Secondary | ICD-10-CM | POA: Diagnosis not present

## 2023-12-12 LAB — CULTURE, BLOOD (ROUTINE X 2)
Special Requests: ADEQUATE
Special Requests: ADEQUATE

## 2023-12-12 LAB — CBC
HCT: 22.4 % — ABNORMAL LOW (ref 39.0–52.0)
Hemoglobin: 7.2 g/dL — ABNORMAL LOW (ref 13.0–17.0)
MCH: 31.3 pg (ref 26.0–34.0)
MCHC: 32.1 g/dL (ref 30.0–36.0)
MCV: 97.4 fL (ref 80.0–100.0)
Platelets: 131 K/uL — ABNORMAL LOW (ref 150–400)
RBC: 2.3 MIL/uL — ABNORMAL LOW (ref 4.22–5.81)
RDW: 15.7 % — ABNORMAL HIGH (ref 11.5–15.5)
WBC: 3.9 K/uL — ABNORMAL LOW (ref 4.0–10.5)
nRBC: 0 % (ref 0.0–0.2)

## 2023-12-12 LAB — GLUCOSE, CAPILLARY: Glucose-Capillary: 141 mg/dL — ABNORMAL HIGH (ref 70–99)

## 2023-12-12 MED ORDER — METOPROLOL SUCCINATE ER 25 MG PO TB24
25.0000 mg | ORAL_TABLET | Freq: Every day | ORAL | Status: DC
  Administered 2023-12-12 – 2023-12-16 (×4): 25 mg via ORAL
  Filled 2023-12-12 (×5): qty 1

## 2023-12-12 MED ORDER — LIDOCAINE 5 % EX PTCH
1.0000 | MEDICATED_PATCH | CUTANEOUS | Status: DC
  Administered 2023-12-12 – 2023-12-18 (×6): 1 via TRANSDERMAL
  Filled 2023-12-12 (×6): qty 1

## 2023-12-12 NOTE — Progress Notes (Addendum)
 Received pt with Nitrogycerin infusion running at 24ml/hr. PA Darryle was notified as MAR for Nitroglycerin  has been DC. He ordered to wean off pt from Nitroglycerin  as BP remains stable. Stopped Nitroglycerin  at 0840am.   1000: Latest BP is 165/73 after Nitroglycerin  was discontinued. MD was notified, no further order at the moment, continue to monitor.

## 2023-12-12 NOTE — Consult Note (Signed)
 [facility]    Date of Admission:  12/09/2023     Total days of antibiotics 4               Reason for Consult: Streptococcus bacteremia  Referring Provider: Dr. Vernon Primary Care Provider: Ngetich, Roxan [name], NP   ASSESSMENT:  Kirk [name] is a 63 y/o AA male with ESRD on HD with previous renal transplant and history of aortic stenosis presenting with worsening chest pain and found to have suspected Type II STEMI and Streptococcus mitis/oralis bacteremia. Source of infection is unclear. Fistula without evidence of infection. Discussed plan of care to check blood cultures for clearance of bacteremia and continue current dose of ceftriaxone  while awaiting results of his TEE.  TAVR postponed secondary to bacteremia. Continue standard/universal precautions. Remaining medical and supportive care per Internal Medicine and Nephrology.   PLAN:  Continue current dose of Ceftriaxone . Monitor blood cultures for clearance of bacteremia.  TEE scheduled for tomorrow.  Standard/universal precautions. Remaining medical and supportive care per Internal Medicine and Nephrology.    Principal Problem:   NSTEMI (non-ST elevated myocardial infarction) [facility]) Active Problems:   Streptococcal bacteremia   Chest pain    aspirin  EC  81 mg Oral Daily   Chlorhexidine  Gluconate Cloth  6 each Topical Q0600   heparin   5,000 Units Subcutaneous Q8H   lidocaine   1 patch Transdermal Q24H   metoprolol  succinate  25 mg Oral Daily   pravastatin   40 mg Oral QHS     HPI: Kirk [name] is a 63 y.o. male with previous medical history of ESRD on HD secondary to [place] syndrome, prior renal transplant, aortic stenosis, and paroxysmal atrial fibrillation presenting to the hospital with chest pain and shortness of breath.   Kirk [name] presented from home with episodes of chest pain that have been going on for the past 2 months that progressively worsened over the 3 days prior to  admission. Had been seen in the ED on 11/15/23 for non-specific chest pain.  Elevated temperature of 100.2 F on arrival with WBC count of 5,400. Chest x-ray with cardiomegaly and central patchy opacities. CT head with no acute intracranial abnormalities. Followed by Cardiology for aortic stenosis with plan for TAVR. Started on vancomycin , metronidazole , and cefepime  with blood cultures drawn. Febrile on 12/10/23 with blood cultures now positive for Streptococcus mitis/oralis bacteremia.   Kirk [name] has not had any recent procedures outside of blood draws and has no teeth. No implanted devices or joint replacements. Had not received antibiotics prior to admission. Living situation is challenging with limited utilities. Current on Day 3 of antimicrobial therapy narrowed to Ceftriaxone  with sensitivities of pending. Received dialysis through the fistula in his left forearm which has been functioning appropriate with no evidence of infection. CVTS evaluated for TAVR and delayed with new infection. Scheduled for TEE tomorrow.     Review of Systems: Review of Systems  Constitutional:  Negative for chills, fever and weight loss.  Respiratory:  Negative for cough, shortness of breath and wheezing.   Cardiovascular:  Negative for chest pain and leg swelling.  Gastrointestinal:  Negative for abdominal pain, constipation, diarrhea, nausea and vomiting.  Skin:  Negative for rash.     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)    Anxiety  Asthma    Asthma    moderate persistent   Carpal tunnel syndrome    Cellulitis and abscess of trunk    Cholelithiasis 07/13/2014   Chronic headaches    Cigarette smoker 07/11/2017   Stopped 2/022  - referred to start Lung cancer screening 04/2021         Debility, unspecified    Dermatophytosis of the body     Dysrhythmia    history of   Edema    End stage renal disease on dialysis [facility])    MWF; [facility] in [place] (10/21/2014)   Essential hypertension, benign    Generalized anxiety disorder 05/25/2022   GERD (gastroesophageal reflux disease)    Gout, unspecified    History of renal transplant    HTN (hypertension)    Hypertrophy of prostate without urinary obstruction and other lower urinary tract symptoms (LUTS)    Hypotension, unspecified    Hypoxia 03/21/2023   Impotence of organic origin    Insomnia, unspecified    Localization-related (focal) (partial) epilepsy and epileptic syndromes with complex partial seizures, without mention of intractable epilepsy    12-15-19- Wife states he has NEVER had a seizure    Lumbago    Memory loss    OSA on CPAP    Other and unspecified hyperlipidemia    controlled /managed per wife    Other chronic nonalcoholic liver disease    Other malaise and fatigue    Other nonspecific abnormal serum enzyme levels    Pain in joint, lower leg    Pain in joint, upper arm    Pneumonia several times   PONV (postoperative nausea and vomiting)    Secondary hyperparathyroidism (of renal origin)    Shortness of breath    Sleep apnea    wears cpap    Tension headache    Unspecified constipation    Unspecified essential hypertension    Unspecified hereditary and idiopathic peripheral neuropathy    Unspecified vitamin D  deficiency     Social History   Tobacco Use   Smoking status: Former    Current packs/day: 0.00    Average packs/day: 0.5 packs/day for 32.0 years (16.0 ttl pk-yrs)    Types: Cigarettes    Start date: 47    Quit date: 02/24/2020    Years since quitting: 3.8   Smokeless tobacco: Never  Vaping Use   Vaping status: Never Used  Substance Use Topics   Alcohol use: No    Alcohol/week: 0.0 standard drinks of alcohol   Drug use: No    Family History  Adopted: Yes  Problem Relation Age of Onset   Colon cancer Neg Hx     Esophageal cancer Neg Hx    Rectal cancer Neg Hx    Stomach cancer Neg Hx    Colon polyps Neg Hx     Allergies  Allergen Reactions   Codeine Nausea And Vomiting   Lortab [Hydrocodone -Acetaminophen ] Nausea And Vomiting    OBJECTIVE: Blood pressure (!) 166/68, pulse 88, temperature 98.3 F (36.8 C), temperature source Oral, resp. rate 18, height 5' 7 (1.702 m), weight 80.5 kg, SpO2 99%.  Physical Exam Constitutional:      General: He is not in acute distress.    Appearance: He is well-developed.     Comments: Lying in bed with head of bed elevated; pleasant.   Cardiovascular:     Rate and Rhythm: Normal rate and regular rhythm.     Heart sounds: Murmur heard.  Pulmonary:  Effort: Pulmonary effort is normal.     Breath sounds: Normal breath sounds.  Skin:    General: Skin is warm and dry.  Neurological:     Mental Status: He is alert.     Lab Results Lab Results  Component Value Date   WBC 3.9 (L) 12/12/2023   HGB 7.2 (L) 12/12/2023   HCT 22.4 (L) 12/12/2023   MCV 97.4 12/12/2023   PLT 131 (L) 12/12/2023    Lab Results  Component Value Date   CREATININE 7.00 (H) 12/11/2023   BUN 27 (H) 12/11/2023   NA 134 (L) 12/11/2023   K 4.2 12/11/2023   CL 93 (L) 12/11/2023   CO2 25 12/11/2023    Lab Results  Component Value Date   ALT 16 11/15/2023   AST 18 11/15/2023   ALKPHOS 70 11/15/2023   BILITOT 1.5 (H) 11/15/2023     Microbiology: Recent Results (from the past 240 hours)  Resp panel by RT-PCR (RSV, Flu A&B, Covid) Anterior Nasal Swab     Status: None   Collection Time: 12/09/23 10:21 PM   Specimen: Anterior Nasal Swab  Result Value Ref Range Status   SARS Coronavirus 2 by RT PCR NEGATIVE NEGATIVE Final   Influenza A by PCR NEGATIVE NEGATIVE Final   Influenza B by PCR NEGATIVE NEGATIVE Final    Comment: (NOTE) The Xpert Xpress SARS-CoV-2/FLU/RSV plus assay is intended as an aid in the diagnosis of influenza from Nasopharyngeal swab specimens  and should not be used as a sole basis for treatment. Nasal washings and aspirates are unacceptable for Xpert Xpress SARS-CoV-2/FLU/RSV testing.  Fact Sheet for Patients: bloggercourse.com  Fact Sheet for Healthcare Providers: seriousbroker.it  This test is not yet approved or cleared by the [place]  FDA and has been authorized for detection and/or diagnosis of SARS-CoV-2 by FDA under an Emergency Use Authorization (EUA). This EUA will remain in effect (meaning this test can be used) for the duration of the COVID-19 declaration under Section 564(b)(1) of the Act, 21 U.S.C. section 360bbb-3(b)(1), unless the authorization is terminated or revoked.     Resp Syncytial Virus by PCR NEGATIVE NEGATIVE Final    Comment: (NOTE) Fact Sheet for Patients: bloggercourse.com  Fact Sheet for Healthcare Providers: seriousbroker.it  This test is not yet approved or cleared by the [place]  FDA and has been authorized for detection and/or diagnosis of SARS-CoV-2 by FDA under an Emergency Use Authorization (EUA). This EUA will remain in effect (meaning this test can be used) for the duration of the COVID-19 declaration under Section 564(b)(1) of the Act, 21 U.S.C. section 360bbb-3(b)(1), unless the authorization is terminated or revoked.  Performed at [facility], [address], [place], [place] [address]   Blood Culture (routine x 2)     Status: Abnormal   Collection Time: 12/09/23 10:21 PM   Specimen: BLOOD RIGHT ARM  Result Value Ref Range Status   Specimen Description BLOOD RIGHT ARM  Final   Special Requests   Final    BOTTLES DRAWN AEROBIC AND ANAEROBIC Blood Culture adequate volume   Culture  Setup Time   Final    GRAM POSITIVE COCCI IN CHAINS IN BOTH AEROBIC AND ANAEROBIC BOTTLES CRITICAL RESULT CALLED TO, READ BACK BY AND VERIFIED WITH: PHARMD J.FRENS AT 1400 ON  12/10/2023 BY T.SAAD.  Performed at [facility], [address] [place], [place] [address]    Culture STREPTOCOCCUS MITIS/ORALIS (A)  Final   Report Status 12/12/2023 FINAL  Final   Organism ID, Bacteria STREPTOCOCCUS MITIS/ORALIS  Final      Susceptibility   Streptococcus mitis/oralis - MIC*    PENICILLIN <=0.06 SENSITIVE Sensitive     CEFTRIAXONE  <=0.12 SENSITIVE Sensitive     LEVOFLOXACIN  1 SENSITIVE Sensitive     VANCOMYCIN  0.5 SENSITIVE Sensitive     * STREPTOCOCCUS MITIS/ORALIS  Blood Culture (routine x 2)     Status: Abnormal   Collection Time: 12/09/23 10:21 PM   Specimen: BLOOD RIGHT ARM  Result Value Ref Range Status   Specimen Description BLOOD RIGHT ARM  Final   Special Requests   Final    BOTTLES DRAWN AEROBIC AND ANAEROBIC Blood Culture adequate volume   Culture  Setup Time   Final    GRAM POSITIVE COCCI IN CHAINS IN BOTH AEROBIC AND ANAEROBIC BOTTLES CRITICAL VALUE NOTED.  VALUE IS CONSISTENT WITH PREVIOUSLY REPORTED AND CALLED VALUE.    Culture (A)  Final    STREPTOCOCCUS MITIS/ORALIS SUSCEPTIBILITIES PERFORMED ON PREVIOUS CULTURE WITHIN THE LAST 5 DAYS. Performed at [facility], [address], [place], [place] [address]    Report Status 12/12/2023 FINAL  Final  Blood Culture ID Panel (Reflexed)     Status: Abnormal   Collection Time: 12/09/23 10:21 PM  Result Value Ref Range Status   Enterococcus faecalis NOT DETECTED NOT DETECTED Final   Enterococcus Faecium NOT DETECTED NOT DETECTED Final   Listeria monocytogenes NOT DETECTED NOT DETECTED Final   Staphylococcus species NOT DETECTED NOT DETECTED Final   Staphylococcus aureus (BCID) NOT DETECTED NOT DETECTED Final   Staphylococcus epidermidis NOT DETECTED NOT DETECTED Final   Staphylococcus lugdunensis NOT DETECTED NOT DETECTED Final   Streptococcus species DETECTED (A) NOT DETECTED Final    Comment: Not Enterococcus species, Streptococcus agalactiae, Streptococcus pyogenes, or Streptococcus  pneumoniae. CRITICAL RESULT CALLED TO, READ BACK BY AND VERIFIED WITH: PHARMD J.FRENS AT 1400 ON 12/10/2023 BY T.SAAD.     Streptococcus agalactiae NOT DETECTED NOT DETECTED Final   Streptococcus pneumoniae NOT DETECTED NOT DETECTED Final   Streptococcus pyogenes NOT DETECTED NOT DETECTED Final   A.calcoaceticus-baumannii NOT DETECTED NOT DETECTED Final   Bacteroides fragilis NOT DETECTED NOT DETECTED Final   Enterobacterales NOT DETECTED NOT DETECTED Final   Enterobacter cloacae complex NOT DETECTED NOT DETECTED Final   Escherichia coli NOT DETECTED NOT DETECTED Final   Klebsiella aerogenes NOT DETECTED NOT DETECTED Final   Klebsiella oxytoca NOT DETECTED NOT DETECTED Final   Klebsiella pneumoniae NOT DETECTED NOT DETECTED Final   Proteus species NOT DETECTED NOT DETECTED Final   Salmonella species NOT DETECTED NOT DETECTED Final   Serratia marcescens NOT DETECTED NOT DETECTED Final   Haemophilus influenzae NOT DETECTED NOT DETECTED Final   Neisseria meningitidis NOT DETECTED NOT DETECTED Final   Pseudomonas aeruginosa NOT DETECTED NOT DETECTED Final   Stenotrophomonas maltophilia NOT DETECTED NOT DETECTED Final   Candida albicans NOT DETECTED NOT DETECTED Final   Candida auris NOT DETECTED NOT DETECTED Final   Candida glabrata NOT DETECTED NOT DETECTED Final   Candida krusei NOT DETECTED NOT DETECTED Final   Candida parapsilosis NOT DETECTED NOT DETECTED Final   Candida tropicalis NOT DETECTED NOT DETECTED Final   Cryptococcus neoformans/gattii NOT DETECTED NOT DETECTED Final    Comment: Performed at [facility], [address] [place], [place] [address]     Cathlyn July, NP [facility]  12/12/2023  1:28 PM

## 2023-12-12 NOTE — Progress Notes (Signed)
 [facility] Progress Note   Subjective:  HD yesterday net UF 1.1L  Seen in room. Nitroglycerin  gtt. Chest pain improving this am.   Breathing ok, better today    Objective Vitals:   12/11/23 2315 12/12/23 0017 12/12/23 0315 12/12/23 0900  BP: (!) 158/85  (!) 153/65 (!) 166/68  Pulse: 89 96 86 88  Resp: 16  18 18   Temp: 98.8 F (37.1 C)  98.3 F (36.8 C) 98.3 F (36.8 C)  TempSrc: Oral  Oral Oral  SpO2: 98% 99% 96% 99%  Weight:   80.5 kg   Height:        Additional Objective Labs: Basic Metabolic Panel: Recent Labs  Lab 12/09/23 2131 12/09/23 2228 12/10/23 1004 12/11/23 0352 12/11/23 1320  NA 135 136 136 134*  --   K 3.4* 3.7 4.8 4.2  --   CL 92* 93* 93* 93*  --   CO2 28  --  25 25  --   GLUCOSE 136* 113* 121* 149*  --   BUN 13 15 20  27*  --   CREATININE 5.37* 5.60* 6.47* 5.90* 7.00*  CALCIUM  8.2*  --  8.3* 8.6*  --   PHOS  --   --  4.4 4.6  --    CBC: Recent Labs  Lab 12/10/23 1004 12/10/23 1711 12/11/23 0352 12/11/23 1320 12/12/23 0415  WBC 5.3 5.8 4.1 4.0 3.9*  NEUTROABS  --  4.9 2.9  --   --   HGB 7.9* 7.4* 7.4* 7.1* 7.2*  HCT 25.5* 23.1* 23.7* 22.0* 22.4*  MCV 99.6 97.5 99.6 98.7 97.4  PLT 130* 134* 134* 121* 131*   Blood Culture    Component Value Date/Time   SDES BLOOD RIGHT ARM 12/09/2023 2221   SDES BLOOD RIGHT ARM 12/09/2023 2221   SPECREQUEST  12/09/2023 2221    BOTTLES DRAWN AEROBIC AND ANAEROBIC Blood Culture adequate volume   SPECREQUEST  12/09/2023 2221    BOTTLES DRAWN AEROBIC AND ANAEROBIC Blood Culture adequate volume   CULT (A) 12/09/2023 2221    STREPTOCOCCUS MITIS/ORALIS SUSCEPTIBILITIES TO FOLLOW Performed at [facility], [address] [place], [place] [address]    CULT STREPTOCOCCUS MITIS/ORALIS (A) 12/09/2023 2221   REPTSTATUS PENDING 12/09/2023 2221   REPTSTATUS PENDING 12/09/2023 2221     Physical Exam General: chronically ill appearing, nad  Heart: RRR, +SEM Lungs: Coarse breath  sounds  Abdomen: non tender Extremities: trace LE edema  Dialysis Access: L AVF +bruit   Medications:  cefTRIAXone  (ROCEPHIN )  IV 2 g (12/11/23 2034)    aspirin  EC  81 mg Oral Daily   Chlorhexidine  Gluconate Cloth  6 each Topical Q0600   heparin   5,000 Units Subcutaneous Q8H   metoprolol  succinate  25 mg Oral Daily   pravastatin   40 mg Oral QHS    Dialysis Orders:  [facility] MWF  4h   B400   81.3kg   LFA AVF   Heparin  2500 Last OP HD 11/17, post wt 82kg Mircera 75 mcg q 4wks, last 11/12, due 12/10  Assessment/Plan: NSTEMI/Elevated troponin. IV Heparin /nitrogly per cardiology. Cardiology following.  HFrEF 25-30%.  Volume overload on arrival. Optimize volume with HD.  Had extra HD Tues. Now below EDW.  Strep mitis bacteremia. Antibiotics per primary. For TEE   ESRD: on HD MWF. Hasn't missed HD. Back on schedule HD Friday.  HTN. BP acceptable. On metoprolol   Anemia. Hgb 7s. Transfuse pren. ESA recently dosed. Will increase frequency to q 2 wks -  next ESA due 11/26.  2HPTH. Ca/Phos ok. Continue home meds. Sevelamer  powder binder AS. TCTS consulted. Planning for outpatient TAVR.    Maisie Ronnald Acosta PA-C [facility] 12/12/2023,9:39 AM

## 2023-12-12 NOTE — Progress Notes (Signed)
 PROGRESS NOTE    BELMONT VALLI  FMW:[id number] DOB: Jul 18, 1960 DOA: 12/09/2023 PCP: Leonarda Roxan [name], NP   Brief Narrative:  Kirk [name] is a 63 y.o. 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  He did complete 4 hours of hemodialysis.  His pain was, 10 out of 10 and associated with shortness of breath.  Worse when taking deep breath.  EMS was activated.   In the ER, he received full dose aspirin  324 mg x 1.  His chest pain persisted.  His twelve-lead EKG showed no evidence of acute ischemia.  High-sensitivity troponin 153 with delta, repeat 1098.  EDP discussed the case with cardiology recommended heparin  drip and admit for possible NSTEMI.    Assessment & Plan:   Principal Problem:   NSTEMI (non-ST elevated myocardial infarction) (HCC) Active Problems:   Chest pain  History of severe low-flow low gradient aortic stenosis/elevated troponin/elevated troponin: Came in with typical chest pain, no EKG changes however troponins jumped from 153> 1098> 5953> 5620.  Patient started on nitro as well as heparin  drip per cardiology.  Cardiology opines that this is likely demand ischemia as echo shows reduced ejection fracture with global hypokinesis which is no change from previous echo done 2 months ago.  Also, patient recently had CT coronary about 3 weeks ago which showed diffuse aortic atherosclerosis.  Patient received heparin  and nitroglycerin  drip for approximately 48 hours which were discontinued on 12/11/2023 per cardiology's recommendation.  Further management per cardiology.  Sepsis secondary to gram-positive bacteremia, not POA: Upon arrival, patient did not have any signs or symptoms of sepsis.  After admission, patient spiked fever  with Tmax of 102.9 at around 4 PM on 12/10/2023 along with tachycardia and tachypnea and blood culture was positive with gram-positive cocci.  Final sensitivities and identification pending.  Patient started on Rocephin .  Last temperature spike of 100.9 at 7 PM 12/10/2023.  He appears to be growing Streptococcus mitis/oralis.  Structural cardiology team was considering TAVR on him outpatient but per them, due to bacteremia, they will have to postpone this.  They recommend TEE for which I have notified cardiology.  I have consulted ID for their help and recommendations as well.  ESRD on HD/volume overload: Upon arrival to ED, patient received IV fluid bolus and was continued on slow IV fluids.  Typically receives dialysis on Monday Wednesday Friday schedule, due to volume overload, patient received dialysis on Tuesday and then on Wednesday to put him back on the schedule.  Nephrology managing.  History of paroxysmal atrial fibrillation: On Toprol -XL 12.5.  Rate still elevated.  Not on any anticoagulation previously.  Will defer to cardiology.  Chronic combined systolic and diastolic congestive heart failure: Volume management by dialysis.  Essential hypertension: Controlled, continue Toprol -XL.  Hyperlipidemia: Continue statin.  QTc prolongation: Upon admission, QTc was 542.  Optimize electrolytes and await QTc prolonging medications and monitor on telemetry.  DVT prophylaxis: heparin  injection 5,000 Units Start: 12/11/23 1400Heparin   Code Status: Full Code  Family Communication:  None present at bedside.  Plan of care discussed with patient in length and he/she verbalized understanding and agreed with it.  Status is: Inpatient Remains inpatient appropriate because: Bacteremic, needs TEE tomorrow and ID evaluation   Estimated body mass index is 27.8 kg/m as  calculated from the following:   Height as of this encounter: 5' 7 (1.702 m).   Weight as of this encounter: 80.5 kg.    Nutritional  Assessment: Body mass index is 27.8 kg/m.SABRA Seen by dietician.  I agree with the assessment and plan as outlined below: Nutrition Status:        . Skin Assessment: I have examined the patient's skin and I agree with the wound assessment as performed by the wound care RN as outlined below:    Consultants:  Nephrology and cardiology ID Procedures:  As above  Antimicrobials:  Anti-infectives (From admission, onward)    Start     Dose/Rate Route Frequency Ordered Stop   12/10/23 2200  cefTRIAXone  (ROCEPHIN ) 2 g in sodium chloride  0.9 % 100 mL IVPB        2 g 200 mL/hr over 30 Minutes Intravenous Every 24 hours 12/10/23 1519     12/09/23 2200  vancomycin  (VANCOCIN ) IVPB 1000 mg/200 mL premix  Status:  Discontinued        1,000 mg 200 mL/hr over 60 Minutes Intravenous  Once 12/09/23 2151 12/09/23 2153   12/09/23 2200  ceFEPIme  (MAXIPIME ) 2 g in sodium chloride  0.9 % 100 mL IVPB        2 g 200 mL/hr over 30 Minutes Intravenous  Once 12/09/23 2151 12/09/23 2337   12/09/23 2200  metroNIDAZOLE  (FLAGYL ) IVPB 500 mg        500 mg 100 mL/hr over 60 Minutes Intravenous  Once 12/09/23 2151 12/09/23 2342   12/09/23 2200  vancomycin  (VANCOREADY) IVPB 1750 mg/350 mL        1,750 mg 175 mL/hr over 120 Minutes Intravenous  Once 12/09/23 2153 12/10/23 0142         Subjective: Seen and examined.  Feels better.  Still complains of chest pain and shortness of breath but continues to look better and looks very comfortable to me.  Does not have crackles at the bases today that he had yesterday.  Objective: Vitals:   12/11/23 1940 12/11/23 2315 12/12/23 0017 12/12/23 0315  BP: (!) 158/89 (!) 158/85  (!) 153/65  Pulse: 94 89 96 86  Resp: 18 16  18   Temp: 98.8 F (37.1 C) 98.8 F (37.1 C)  98.3 F (36.8 C)  TempSrc: Oral Oral  Oral  SpO2: 100% 98% 99% 96%  Weight:    80.5 kg  Height:        Intake/Output Summary (Last 24 hours) at 12/12/2023 0744 Last data filed at 12/11/2023  1800 Gross per 24 hour  Intake --  Output 1100 ml  Net -1100 ml   Filed Weights   12/11/23 1431 12/11/23 1800 12/12/23 0315  Weight: 82.6 kg 81.5 kg 80.5 kg    Examination:  General exam: Appears calm and comfortable  Respiratory system: Clear to auscultation. Respiratory effort normal. Cardiovascular system: S1 & S2 heard, RRR. No JVD, murmurs, rubs, gallops or clicks. No pedal edema. Gastrointestinal system: Abdomen is nondistended, soft and nontender. No organomegaly or masses felt. Normal bowel sounds heard. Central nervous system: Alert and oriented. No focal neurological deficits. Extremities: Symmetric 5 x 5 power. Skin: No rashes, lesions or ulcers.  Psychiatry: Judgement and insight appear normal. Mood & affect appropriate.    Data Reviewed: I have personally reviewed following labs and imaging studies  CBC: Recent Labs  Lab 12/10/23 1004 12/10/23 1711 12/11/23 0352 12/11/23 1320 12/12/23 0415  WBC 5.3 5.8 4.1 4.0 3.9*  NEUTROABS  --  4.9 2.9  --   --   HGB 7.9* 7.4* 7.4* 7.1* 7.2*  HCT 25.5* 23.1* 23.7* 22.0* 22.4*  MCV 99.6 97.5 99.6 98.7 97.4  PLT 130* 134* 134* 121* 131*   Basic Metabolic Panel: Recent Labs  Lab 12/09/23 2131 12/09/23 2228 12/10/23 1004 12/11/23 0352 12/11/23 1320  NA 135 136 136 134*  --   K 3.4* 3.7 4.8 4.2  --   CL 92* 93* 93* 93*  --   CO2 28  --  25 25  --   GLUCOSE 136* 113* 121* 149*  --   BUN 13 15 20  27*  --   CREATININE 5.37* 5.60* 6.47* 5.90* 7.00*  CALCIUM  8.2*  --  8.3* 8.6*  --   MG  --   --  1.9  --   --   PHOS  --   --  4.4 4.6  --    GFR: Estimated Creatinine Clearance: 11.3 mL/min (A) (by C-G formula based on SCr of 7 mg/dL (H)). Liver Function Tests: Recent Labs  Lab 12/10/23 1004 12/11/23 0352  ALBUMIN  2.8* 2.6*   No results for input(s): LIPASE, AMYLASE in the last 168 hours. No results for input(s): AMMONIA in the last 168 hours. Coagulation Profile: Recent Labs  Lab 12/09/23 2221  INR  1.1   Cardiac Enzymes: No results for input(s): CKTOTAL, CKMB, CKMBINDEX, TROPONINI in the last 168 hours. BNP (last 3 results) No results for input(s): PROBNP in the last 8760 hours. HbA1C: No results for input(s): HGBA1C in the last 72 hours. CBG: Recent Labs  Lab 12/10/23 0929  GLUCAP 112*   Lipid Profile: No results for input(s): CHOL, HDL, LDLCALC, TRIG, CHOLHDL, LDLDIRECT in the last 72 hours. Thyroid  Function Tests: No results for input(s): TSH, T4TOTAL, FREET4, T3FREE, THYROIDAB in the last 72 hours. Anemia Panel: No results for input(s): VITAMINB12, FOLATE, FERRITIN, TIBC, IRON, RETICCTPCT in the last 72 hours. Sepsis Labs: Recent Labs  Lab 12/09/23 2228  LATICACIDVEN 1.0    Recent Results (from the past 240 hours)  Resp panel by RT-PCR (RSV, Flu A&B, Covid) Anterior Nasal Swab     Status: None   Collection Time: 12/09/23 10:21 PM   Specimen: Anterior Nasal Swab  Result Value Ref Range Status   SARS Coronavirus 2 by RT PCR NEGATIVE NEGATIVE Final   Influenza A by PCR NEGATIVE NEGATIVE Final   Influenza B by PCR NEGATIVE NEGATIVE Final    Comment: (NOTE) The Xpert Xpress SARS-CoV-2/FLU/RSV plus assay is intended as an aid in the diagnosis of influenza from Nasopharyngeal swab specimens and should not be used as a sole basis for treatment. Nasal washings and aspirates are unacceptable for Xpert Xpress SARS-CoV-2/FLU/RSV testing.  Fact Sheet for Patients: bloggercourse.com  Fact Sheet for Healthcare Providers: seriousbroker.it  This test is not yet approved or cleared by the [place]  FDA and has been authorized for detection and/or diagnosis of SARS-CoV-2 by FDA under an Emergency Use Authorization (EUA). This EUA will remain in effect (meaning this test can be used) for the duration of the COVID-19 declaration under Section 564(b)(1) of the Act, 21  U.S.C. section 360bbb-3(b)(1), unless the authorization is terminated or revoked.     Resp Syncytial Virus by PCR NEGATIVE NEGATIVE Final    Comment: (NOTE) Fact Sheet for Patients: bloggercourse.com  Fact Sheet for Healthcare Providers: seriousbroker.it  This test is not yet approved or cleared by the [place]  FDA and has been authorized for detection and/or diagnosis of SARS-CoV-2  by FDA under an Emergency Use Authorization (EUA). This EUA will remain in effect (meaning this test can be used) for the duration of the COVID-19 declaration under Section 564(b)(1) of the Act, 21 U.S.C. section 360bbb-3(b)(1), unless the authorization is terminated or revoked.  Performed at [facility], [address] [place], [place] [address]   Blood Culture (routine x 2)     Status: Abnormal (Preliminary result)   Collection Time: 12/09/23 10:21 PM   Specimen: BLOOD RIGHT ARM  Result Value Ref Range Status   Specimen Description BLOOD RIGHT ARM  Final   Special Requests   Final    BOTTLES DRAWN AEROBIC AND ANAEROBIC Blood Culture adequate volume   Culture  Setup Time   Final    GRAM POSITIVE COCCI IN CHAINS IN BOTH AEROBIC AND ANAEROBIC BOTTLES CRITICAL RESULT CALLED TO, READ BACK BY AND VERIFIED WITH: PHARMD J.FRENS AT 1400 ON 12/10/2023 BY T.SAAD.     Culture (A)  Final    STREPTOCOCCUS MITIS/ORALIS SUSCEPTIBILITIES TO FOLLOW Performed at [facility], [address], [place], [place] [address]    Report Status PENDING  Incomplete  Blood Culture (routine x 2)     Status: Abnormal (Preliminary result)   Collection Time: 12/09/23 10:21 PM   Specimen: BLOOD RIGHT ARM  Result Value Ref Range Status   Specimen Description BLOOD RIGHT ARM  Final   Special Requests   Final    BOTTLES DRAWN AEROBIC AND ANAEROBIC Blood Culture adequate volume   Culture  Setup Time   Final    GRAM POSITIVE COCCI IN CHAINS IN BOTH AEROBIC  AND ANAEROBIC BOTTLES CRITICAL VALUE NOTED.  VALUE IS CONSISTENT WITH PREVIOUSLY REPORTED AND CALLED VALUE. Performed at [facility], [address] [place], [place] [address]    Culture STREPTOCOCCUS MITIS/ORALIS (A)  Final   Report Status PENDING  Incomplete  Blood Culture ID Panel (Reflexed)     Status: Abnormal   Collection Time: 12/09/23 10:21 PM  Result Value Ref Range Status   Enterococcus faecalis NOT DETECTED NOT DETECTED Final   Enterococcus Faecium NOT DETECTED NOT DETECTED Final   Listeria monocytogenes NOT DETECTED NOT DETECTED Final   Staphylococcus species NOT DETECTED NOT DETECTED Final   Staphylococcus aureus (BCID) NOT DETECTED NOT DETECTED Final   Staphylococcus epidermidis NOT DETECTED NOT DETECTED Final   Staphylococcus lugdunensis NOT DETECTED NOT DETECTED Final   Streptococcus species DETECTED (A) NOT DETECTED Final    Comment: Not Enterococcus species, Streptococcus agalactiae, Streptococcus pyogenes, or Streptococcus pneumoniae. CRITICAL RESULT CALLED TO, READ BACK BY AND VERIFIED WITH: PHARMD J.FRENS AT 1400 ON 12/10/2023 BY T.SAAD.     Streptococcus agalactiae NOT DETECTED NOT DETECTED Final   Streptococcus pneumoniae NOT DETECTED NOT DETECTED Final   Streptococcus pyogenes NOT DETECTED NOT DETECTED Final   A.calcoaceticus-baumannii NOT DETECTED NOT DETECTED Final   Bacteroides fragilis NOT DETECTED NOT DETECTED Final   Enterobacterales NOT DETECTED NOT DETECTED Final   Enterobacter cloacae complex NOT DETECTED NOT DETECTED Final   Escherichia coli NOT DETECTED NOT DETECTED Final   Klebsiella aerogenes NOT DETECTED NOT DETECTED Final   Klebsiella oxytoca NOT DETECTED NOT DETECTED Final   Klebsiella pneumoniae NOT DETECTED NOT DETECTED Final   Proteus species NOT DETECTED NOT DETECTED Final   Salmonella species NOT DETECTED NOT DETECTED Final   Serratia marcescens NOT DETECTED NOT DETECTED Final   Haemophilus influenzae NOT DETECTED NOT DETECTED  Final   Neisseria meningitidis NOT DETECTED NOT DETECTED  Final   Pseudomonas aeruginosa NOT DETECTED NOT DETECTED Final   Stenotrophomonas maltophilia NOT DETECTED NOT DETECTED Final   Candida albicans NOT DETECTED NOT DETECTED Final   Candida auris NOT DETECTED NOT DETECTED Final   Candida glabrata NOT DETECTED NOT DETECTED Final   Candida krusei NOT DETECTED NOT DETECTED Final   Candida parapsilosis NOT DETECTED NOT DETECTED Final   Candida tropicalis NOT DETECTED NOT DETECTED Final   Cryptococcus neoformans/gattii NOT DETECTED NOT DETECTED Final    Comment: Performed at [facility], [address] [place], [place] [address]     Radiology Studies: ECHOCARDIOGRAM LIMITED Result Date: 12/10/2023    ECHOCARDIOGRAM LIMITED REPORT   Patient Name:   DERRYL UHER Date of Exam: 12/10/2023 Medical Rec #:  [id number]       Height:       67.0 in Accession #:    [id number]      Weight:       180.8 lb Date of Birth:  [birth year]        BSA:          1.937 m Patient Age:    63 years        BP:           147/86 mmHg Patient Gender: M               HR:           106 bpm. Exam Location:  Inpatient Procedure: Cardiac Doppler, Limited Echo and Limited Color Doppler (Both            Spectral and Color Flow Doppler were utilized during procedure). Indications:    NSTEMI I21.4  History:        Patient has prior history of Echocardiogram examinations, most                 recent 10/05/2023. CHF and Cardiomyopathy, COPD and ESRD; Risk                 Factors:Hypertension, Sleep Apnea, Diabetes and Dyslipidemia.  Sonographer:    Koleen Popper RDCS Referring Phys: [id number] TERRY SAILOR HALL  Sonographer Comments: Image acquisition challenging due to respiratory motion. IMPRESSIONS  1. Left ventricular ejection fraction, by estimation, is 25 to 30%. The left ventricle has severely decreased function. The left ventricle demonstrates global hypokinesis. The left ventricular internal cavity size was severely dilated.  Indeterminate diastolic filling due to E-A fusion.  2. Right ventricular systolic function is low normal. The right ventricular size is normal.  3. Left atrial size was mildly dilated.  4. Mild mitral valve regurgitation. Moderate mitral annular calcification.  5. AV leaflets difficult to identify The valve is thickened, calcified Peak and mean gradients through the valve are 32 and 17 mm Hg respectively. Dimensionless valve index is 0.35 Overall consistent with moderate AS. Compared to echo from Sept 2025, mean gradient is mildly decreased (22 to 17 mm HG).. The aortic valve has an indeterminant number of cusps. Aortic valve regurgitation is not visualized. Comparison(s): The left ventricular function is unchanged. FINDINGS  Left Ventricle: Left ventricular ejection fraction, by estimation, is 25 to 30%. The left ventricle has severely decreased function. The left ventricle demonstrates global hypokinesis. The left ventricular internal cavity size was severely dilated. There is no left ventricular hypertrophy. Indeterminate diastolic filling due to E-A fusion. Right Ventricle: The right ventricular size is normal. Right vetricular wall thickness was not assessed. Right ventricular systolic function is low normal. Left  Atrium: Left atrial size was mildly dilated. Right Atrium: Right atrial size was normal in size. Pericardium: There is no evidence of pericardial effusion. Mitral Valve: There is mild thickening of the mitral valve leaflet(s). Moderate mitral annular calcification. Mild mitral valve regurgitation. MV peak gradient, 8.5 mmHg. The mean mitral valve gradient is 4.0 mmHg. Tricuspid Valve: The tricuspid valve is normal in structure. Tricuspid valve regurgitation is mild. Aortic Valve: AV leaflets difficult to identify The valve is thickened, calcified Peak and mean gradients through the valve are 32 and 17 mm Hg respectively. Dimensionless valve index is 0.35 Overall consistent with moderate AS. Compared  to echo from Sept 2025, mean gradient is mildly decreased (22 to 17 mm HG). The aortic valve has an indeterminant number of cusps. Aortic valve regurgitation is not visualized. Aortic valve mean gradient measures 17.0 mmHg. Aortic valve peak gradient measures 31.4 mmHg. IAS/Shunts: No atrial level shunt detected by color flow Doppler. Additional Comments: Spectral Doppler performed. Color Doppler performed.  LEFT VENTRICLE PLAX 2D LVIDd:         6.20 cm Diastology LVIDs:         5.40 cm LV e' medial:    7.18 cm/s LV PW:         1.00 cm LV E/e' medial:  17.0 LV IVS:        0.80 cm LV e' lateral:   7.07 cm/s                        LV E/e' lateral: 17.3  RIGHT VENTRICLE             IVC RV S prime:     14.80 cm/s  IVC diam: 3.00 cm TAPSE (M-mode): 2.0 cm LEFT ATRIUM           Index LA diam:      4.40 cm 2.27 cm/m LA Vol (A4C): 66.8 ml 34.48 ml/m  AORTIC VALVE AV Vmax:           280.33 cm/s AV Vmean:          190.000 cm/s AV VTI:            0.433 m AV Peak Grad:      31.4 mmHg AV Mean Grad:      17.0 mmHg LVOT Vmax:         100.00 cm/s LVOT Vmean:        67.100 cm/s LVOT VTI:          0.153 m LVOT/AV VTI ratio: 0.35 MITRAL VALVE MV Area (PHT): 5.66 cm     SHUNTS MV Peak grad:  8.5 mmHg     Systemic VTI: 0.15 m MV Mean grad:  4.0 mmHg MV Vmax:       1.46 m/s MV Vmean:      93.5 cm/s MV Decel Time: 134 msec MV E velocity: 122.00 cm/s MV A velocity: 83.30 cm/s MV E/A ratio:  1.46 Vina Gull MD Electronically signed by Vina Gull MD Signature Date/Time: 12/10/2023/3:26:28 PM    Final     Scheduled Meds:  aspirin  EC  81 mg Oral Daily   Chlorhexidine  Gluconate Cloth  6 each Topical Q0600   heparin   5,000 Units Subcutaneous Q8H   metoprolol  succinate  12.5 mg Oral Daily   pravastatin   40 mg Oral QHS   Continuous Infusions:  cefTRIAXone  (ROCEPHIN )  IV 2 g (12/11/23 2034)     LOS: 2 days   Fredia Skeeter, MD  [facility]  12/12/2023, 7:44 AM   *Please note that this is a verbal dictation therefore any  spelling or grammatical errors are due to the Dragon Medical One system interpretation.  Please page via Amion and do not message via secure chat for urgent patient care matters. Secure chat can be used for non urgent patient care matters.  How to contact the [facility] Attending or Consulting provider 7A - 7P or covering provider during after hours 7P -7A, for this patient?  Check the care team in [facility] and look for a) attending/consulting [facility] provider listed and b) the [facility] team listed. Page or secure chat 7A-7P. Log into [email or web address] and use [facility] universal password to access. If you do not have the password, please contact the hospital operator. Locate the [facility] provider you are looking for under [facility] and page to a number that you can be directly reached. If you still have difficulty reaching the provider, please page the [facility] (Director on Call) for the Hospitalists listed on amion for assistance.

## 2023-12-12 NOTE — TOC Initial Note (Signed)
 Transition of Care [facility]) - Initial/Assessment Note    Patient Details  Name: Kirk [name] MRN: [id number] Date of Birth: 07-Sep-1960  Transition of Care [facility]) CM/SW Contact:    Sudie Erminio Deems, RN Phone Number: 12/12/2023, 2:20 PM  Clinical Narrative:  Patient presented for chest pain. Hx ESRD HD MWF sessions. PTA patient was from home with spouse and daughter. Spouse is currently in the hospital as well. Patient has DME rolling walker, oxygen  3 liters, and portable tanks in the home via [employer]. Patient states he drives to appointments. ICM received a consult regarding medication assistance. Patient states he has not been able to afford his co pays with everything be so expensive. ICM did receive verbal permission from the patient for [facility] to assess to see if patient is eligible for Medicaid-consult submitted to [facility]. ICM will continue to follow for additional needs.   Expected Discharge Plan: Home/Self Care Barriers to Discharge: Continued Medical Work up  Expected Discharge Plan and Services In-house Referral: NA Discharge Planning Services: CM Consult Post Acute Care Choice: NA Living arrangements for the past 2 months: Single Family Home                   DME Agency: NA  Prior Living Arrangements/Services Living arrangements for the past 2 months: Single Family Home Lives with:: Spouse Patient language and need for interpreter reviewed:: Yes Do you feel safe going back to the place where you live?: Yes      Need for Family Participation in Patient Care: Yes (Comment) Care giver support system in place?: Yes (comment) Current home services: DME (oxygen  3 liters, rolling walker) Criminal Activity/Legal Involvement Pertinent to Current Situation/Hospitalization: No - Comment as needed  Activities of Daily Living   ADL Screening (condition at time of admission) Independently performs ADLs?: No Does the patient have a NEW difficulty with  bathing/dressing/toileting/self-feeding that is expected to last >3 days?: No Does the patient have a NEW difficulty with getting in/out of bed, walking, or climbing stairs that is expected to last >3 days?: No Does the patient have a NEW difficulty with communication that is expected to last >3 days?: No Is the patient deaf or have difficulty hearing?: Yes (Per patient hears very little from right ear, accomodates with left ear) Does the patient have difficulty seeing, even when wearing glasses/contacts?: Yes Does the patient have difficulty concentrating, remembering, or making decisions?: Yes  Permission Sought/Granted Permission sought to share information with : Case Manager   Emotional Assessment Appearance:: Appears stated age Attitude/Demeanor/Rapport: Engaged Affect (typically observed): Appropriate Orientation: : Oriented to Self, Oriented to Place Alcohol / Substance Use: Not Applicable Psych Involvement: No (comment)  Admission diagnosis:  NSTEMI (non-ST elevated myocardial infarction) (HCC) [I21.4] Chest pain, unspecified type [R07.9] Patient Active Problem List   Diagnosis Date Noted   Streptococcal bacteremia 12/12/2023   NSTEMI (non-ST elevated myocardial infarction) (HCC) 12/10/2023   Nonrheumatic aortic valve stenosis 10/06/2023   Precordial chest pain 10/05/2023   Nonischemic cardiomyopathy (HCC) 10/05/2023   Benign hypertensive kidney disease with chronic kidney disease 10/05/2023   Rhinovirus infection 10/05/2023   Acute febrile illness 10/05/2023   Accelerated hypertension with diastolic congestive heart failure, NYHA class 3 (HCC) 10/04/2023   Anemia due to chronic blood loss 06/20/2023   Melena 06/20/2023   Respiratory distress 06/19/2023   PAF (paroxysmal atrial fibrillation) (HCC) 04/02/2023   Orthostatic hypotension 03/20/2023   Dysphagia 03/20/2023   COPD (chronic obstructive pulmonary disease) (HCC)    Neutropenia  03/18/2023   Metastasis to  manubrium (HCC) 03/17/2023   Stage IV carcinoma of breast (HCC) 03/17/2023   Heart failure with mildly reduced ejection fraction (HFmrEF, 41-49%) (HCC) 03/16/2023   Acute on chronic combined systolic and diastolic CHF (congestive heart failure) (HCC) 01/09/2023   Rhinitis 09/18/2022   PTSD (post-traumatic stress disorder) 09/12/2022   Aortic atherosclerosis 05/25/2022   Cerebral aneurysm 11/30/2021   Subdural hematoma (HCC) 11/30/2021   Traumatic subdural hematoma (SDH) (HCC) 11/21/2021   SDH (subdural hematoma) (HCC) 11/08/2021   Seizure (HCC) 11/08/2021   Chronic left shoulder pain 09/21/2021   Chronic right shoulder pain 09/21/2021   Cough with hemoptysis 08/01/2021   Preop respiratory exam 03/02/2021   Intractable nausea and vomiting 01/09/2021   Abnormal CT of the chest 05/24/2020   Rotator cuff syndrome of right shoulder 11/17/2019   Chronic systolic heart failure (HCC) 09/25/2018   Fluid overload 09/17/2018   Hypertensive urgency 09/17/2018   Right knee pain 06/02/2018   Right tibial fracture 06/02/2018   ESRD (end stage renal disease) (HCC) 05/30/2017   MDD (major depressive disorder), recurrent episode 05/30/2017   CAP (community acquired pneumonia) 05/30/2017   Hypokalemia    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   Cardiogenic pulmonary edema (HCC) 09/13/2016   Shunt malfunction 07/03/2016   Need for hepatitis C screening test 06/29/2015   Fever 05/17/2015   Myalgia 05/17/2015   Secondary central sleep apnea 12/09/2014   Obesity hypoventilation syndrome (HCC) 12/09/2014   Narcotic drug use 12/09/2014   Hypersomnia with sleep apnea 12/09/2014   SCC (squamous cell carcinoma), arm 11/09/2014   Mild persistent chronic asthma without complication 09/18/2014   ESRD on dialysis (HCC) 09/09/2014   Essential hypertension 09/09/2014   HLD (hyperlipidemia) 09/09/2014   Chest pain 09/09/2014   Anemia of chronic disease 06/23/2014   Pancytopenia (HCC) 04/28/2014   Thrombocytopenia  04/10/2014   Fungal dermatitis 03/17/2014   S/p nephrectomy 09/17/2013   Anemia 09/03/2012   Uncontrolled hypertension    Tension headache    Memory loss    Edema    Thoracic or lumbosacral neuritis or radiculitis 03/27/2012   Nerve root pain 03/27/2012   History of kidney transplant 09/10/2011   Type 2 diabetes mellitus with diabetic nephropathy, without long-term current use of insulin  (HCC) 08/30/2011   Hearing loss 08/16/2011   Obesity 08/16/2011   OSA on CPAP 08/05/2011   GIB (gastrointestinal bleeding) 10/28/2007   PCP:  Leonarda Roxan [name], NP Pharmacy:   [phone] - [place], [place] - [address] [facility] [place] [address] Phone: [phone] Fax: [phone]  Social Drivers of Health (SDOH) Social History: SDOH Screenings   Food Insecurity: Food Insecurity Present (12/11/2023)  Housing: Low Risk  (12/11/2023)  Transportation Needs: Unmet Transportation Needs (12/11/2023)  Utilities: At Risk (12/11/2023)  Alcohol Screen: Low Risk  (02/03/2018)  Depression (PHQ2-9): Low Risk  (01/29/2023)  Financial Resource Strain: Medium Risk (11/02/2021)  Physical Activity: Insufficiently Active (06/25/2017)  Social Connections: Socially Isolated (12/11/2023)  Stress: No Stress Concern Present (06/25/2017)  Tobacco Use: Medium Risk (12/09/2023)   Readmission Risk Interventions    03/18/2023    5:03 PM 01/11/2023    5:26 PM  Readmission Risk Prevention Plan  Transportation Screening Complete Complete  Medication Review (RN Care Manager) Complete Complete  PCP or Specialist appointment within 3-5 days of discharge Complete Complete  HRI or Home Care Consult Complete Complete  Palliative Care Screening Not Applicable Not Applicable  Skilled Nursing Facility  Not Applicable Not Applicable

## 2023-12-12 NOTE — Progress Notes (Addendum)
 Progress Note  Patient Name: Kirk [name] Date of Encounter: 12/12/2023 [facility] Cardiologist: Vinie [name], MD   Interval Summary   Continues to report intermittent stabbing, chest pressure intermittently at rest.  Denies any shortness of breath, no other acute complaints.  Vital Signs Vitals:   12/11/23 1940 12/11/23 2315 12/12/23 0017 12/12/23 0315  BP: (!) 158/89 (!) 158/85  (!) 153/65  Pulse: 94 89 96 86  Resp: 18 16  18   Temp: 98.8 F (37.1 C) 98.8 F (37.1 C)  98.3 F (36.8 C)  TempSrc: Oral Oral  Oral  SpO2: 100% 98% 99% 96%  Weight:    80.5 kg  Height:        Intake/Output Summary (Last 24 hours) at 12/12/2023 0751 Last data filed at 12/11/2023 1800 Gross per 24 hour  Intake --  Output 1100 ml  Net -1100 ml      12/12/2023    3:15 AM 12/11/2023    6:00 PM 12/11/2023    2:31 PM  Last 3 Weights  Weight (lbs) 177 lb 8 oz 179 lb 10.8 oz 182 lb 1.6 oz  Weight (kg) 80.513 kg 81.5 kg 82.6 kg      Telemetry/ECG  Sinus rhythm heart rates around 90.  PVCs.- Personally Reviewed  Physical Exam  GEN: No acute distress.  On supplemental oxygen  Neck: No JVD Cardiac: RRR, 2 out of 6 murmur left sternal border  respiratory: Clear to auscultation bilaterally GI: Soft, nontender, non-distended  MS: No edema.  Right leg appears bigger than the left.    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  Patient admitted for NSTEMI, undergoing workup for TAVR.  Admission complicated by sepsis.  Assessment & Plan   Severe low-flow low gradient aortic stenosis -peak velocity 3.54m/s, MG , AVA VTI 1.04cm2, SVi 35, DI 0.27  Eventual plans for TAVR 12/3 but with active  infection this may need to be delayed.  Structural team following.  Sepsis Blood cultures positive for Streptococcus.  Infectious disease will be involved.  Will plan for TEE tomorrow.  N.p.o. at midnight.  Informed Consent   Shared Decision Making/Informed Consent The risks [stroke (1 in 1000), death (1 in 1000), kidney failure [usually temporary] (1 in 500), bleeding (1 in 200), allergic reaction [possibly serious] (1 in 200)], benefits (diagnostic support and management of coronary artery disease) and alternatives of a cardiac catheterization were discussed in detail with Mr. Devol and he is willing to proceed.      NICM with reduced EF -09/2023 minimal CAD on heart catheterization -09/2023 EF 25 to 30%, global hypokinesis, G2 DD.  Normal RV.  RVSP 45.  Mild to moderate MR. Likely secondary to his aortic stenosis.  EF continues to decrease.  On exam appears to be decently compensated and euvolemic. GDMT limited by ESRD.  Continue with Toprol -XL (see below)  With severe low-flow stenosis need to be careful about dropping pressures too low.  Would not titrate GDMT any further right now given underlying concerns of sepsis and uncertain clinical course. Volume status managed by HD  Chest pain NSTEMI Troponins went from 153 up to 5600+.  EKG with no significant STTW changes. Normal coronaries as above.  Likely related to demand ischemia.  Question component of  microvascular disease though.  Continues to report intermittent, sharp stabbing chest pain. Off heparin , will increase his Toprol -XL 12.5 to 25 mg to see if this helps and will suppress some of his ectopy.  Continue with aspirin , pravastatin  40 mg.  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  Not on anticoagulation given persistent anemia and previous history of subdural hematoma. Continue BB.   Prolonged QTc QTc improved 484 today.  Continue avoid QT prolonging  medications  Anemia  Seems stable but persistently in the low 7s. Monitor carefully.   Addendum: IV nitro drip was discontinued yesterday but patient still getting this.  We will try to wean off and uptitrate antianginals.     For questions or updates, please contact [facility] Please consult [email or web address] for contact info under       Signed, Thom LITTIE Sluder, PA-C     Patient seen and examined, note reviewed with the signed Advanced Practice Provider. I personally reviewed laboratory data, imaging studies and relevant notes. I independently examined the patient and formulated the important aspects of the plan. I have personally discussed the plan with the patient and/or family. Comments or changes to the note/plan are indicated below.  HPI: Still with occasional chest pain, nitroglycerin  drip was not helping.   My Exam:  Physical Exam Vitals and nursing note reviewed.  Constitutional:      Appearance: Normal appearance.  HENT:     Head: Normocephalic and atraumatic.  Eyes:     Conjunctiva/sclera: Conjunctivae normal.  Cardiovascular:     Rate and Rhythm: Normal rate and regular rhythm.  Pulmonary:     Effort: Pulmonary effort is normal.     Breath sounds: Normal breath sounds.  Musculoskeletal:        General: No swelling or tenderness.     Comments: Chest discomfort to palpation  Skin:    Coloration: Skin is not jaundiced or pale.  Neurological:     Mental Status: He is alert.      Telemetry: NSR - Personally reviewed EKG: NSR with PVCs  Assessment & Plan:  Elevated troponin, likely demand ischemia due to hypotension from HD for ESRD in the setting of severe low flow gradient aortic stenosis Echo unchanged from prior Minimal CAD on [facility] 10/04/23 Troponin peaked at 5953 Continue aspirin  81 mg Streptococcus bacteremia TEE tomorrow.  Informed Consent   Shared Decision Making/Informed Consent   The risks [esophageal damage, perforation (1:10,000 risk),  bleeding, pharyngeal hematoma as well as other potential complications associated with conscious sedation including aspiration, arrhythmia, respiratory failure and death], benefits (treatment guidance and diagnostic support) and alternatives of a transesophageal echocardiogram were discussed in detail with Mr. Justiss and he is willing to proceed.     Severe low-flow low gradient aortic stenosis Undergoing outpatient workup for AVR. Has seen Dr. 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 Anemia - nephrology managing and getting ESA.   Time coordinating patient care: 40 minutes  Signed, Emeline Calender, DO [place]  [facility]  12/12/2023 12:17 PM

## 2023-12-12 NOTE — Plan of Care (Signed)

## 2023-12-13 ENCOUNTER — Inpatient Hospital Stay (HOSPITAL_COMMUNITY)

## 2023-12-13 DIAGNOSIS — I214 Non-ST elevation (NSTEMI) myocardial infarction: Secondary | ICD-10-CM | POA: Diagnosis not present

## 2023-12-13 LAB — RENAL FUNCTION PANEL
Albumin: 2.4 g/dL — ABNORMAL LOW (ref 3.5–5.0)
Anion gap: 15 (ref 5–15)
BUN: 37 mg/dL — ABNORMAL HIGH (ref 8–23)
CO2: 25 mmol/L (ref 22–32)
Calcium: 9.1 mg/dL (ref 8.9–10.3)
Chloride: 94 mmol/L — ABNORMAL LOW (ref 98–111)
Creatinine, Ser: 7.82 mg/dL — ABNORMAL HIGH (ref 0.61–1.24)
GFR, Estimated: 7 mL/min — ABNORMAL LOW (ref >60)
Glucose, Bld: 114 mg/dL — ABNORMAL HIGH (ref 70–99)
Phosphorus: 6.7 mg/dL — ABNORMAL HIGH (ref 2.5–4.6)
Potassium: 4.2 mmol/L (ref 3.5–5.1)
Sodium: 134 mmol/L — ABNORMAL LOW (ref 135–145)

## 2023-12-13 LAB — CBC
HCT: 21.7 % — ABNORMAL LOW (ref 39.0–52.0)
Hemoglobin: 7 g/dL — ABNORMAL LOW (ref 13.0–17.0)
MCH: 31.1 pg (ref 26.0–34.0)
MCHC: 32.3 g/dL (ref 30.0–36.0)
MCV: 96.4 fL (ref 80.0–100.0)
Platelets: 136 K/uL — ABNORMAL LOW (ref 150–400)
RBC: 2.25 MIL/uL — ABNORMAL LOW (ref 4.22–5.81)
RDW: 15.5 % (ref 11.5–15.5)
WBC: 3.5 K/uL — ABNORMAL LOW (ref 4.0–10.5)
nRBC: 0 % (ref 0.0–0.2)

## 2023-12-13 LAB — HEMOGLOBIN AND HEMATOCRIT, BLOOD
HCT: 25.4 % — ABNORMAL LOW (ref 39.0–52.0)
Hemoglobin: 8.6 g/dL — ABNORMAL LOW (ref 13.0–17.0)

## 2023-12-13 LAB — HEPARIN LEVEL (UNFRACTIONATED): Heparin Unfractionated: 0.12 [IU]/mL — ABNORMAL LOW (ref 0.30–0.70)

## 2023-12-13 LAB — PREPARE RBC (CROSSMATCH)

## 2023-12-13 MED ORDER — SODIUM CHLORIDE 0.9 % IV SOLN: INTRAVENOUS | Status: DC

## 2023-12-13 MED ORDER — HEPARIN BOLUS VIA INFUSION
2500.0000 [IU] | Freq: Once | INTRAVENOUS | Status: AC
  Administered 2023-12-13: 2500 [IU] via INTRAVENOUS
  Filled 2023-12-13: qty 2500

## 2023-12-13 MED ORDER — SEVELAMER CARBONATE 2.4 G PO PACK
4.8000 g | PACK | Freq: Three times a day (TID) | ORAL | Status: DC
  Administered 2023-12-13 – 2023-12-14 (×3): 4.8 g via ORAL
  Filled 2023-12-13 (×7): qty 2

## 2023-12-13 MED ORDER — PENTAFLUOROPROP-TETRAFLUOROETH EX AERO: 1.0000 | INHALATION_SPRAY | CUTANEOUS | Status: DC | PRN

## 2023-12-13 MED ORDER — SODIUM CHLORIDE 0.9% IV SOLUTION
Freq: Once | INTRAVENOUS | Status: AC
Start: 2023-12-13 — End: 2023-12-13

## 2023-12-13 MED ORDER — HEPARIN SODIUM (PORCINE) 1000 UNIT/ML IJ SOLN
INTRAMUSCULAR | Status: AC
  Filled 2023-12-13: qty 2

## 2023-12-13 MED ORDER — HEPARIN SODIUM (PORCINE) 1000 UNIT/ML DIALYSIS
20.0000 [IU]/kg | INTRAMUSCULAR | Status: DC | PRN
Start: 2023-12-13 — End: 2023-12-13
  Administered 2023-12-13: 1600 [IU] via INTRAVENOUS_CENTRAL

## 2023-12-13 MED ORDER — HEPARIN (PORCINE) 25000 UT/250ML-% IV SOLN
1850.0000 [IU]/h | INTRAVENOUS | Status: DC
  Administered 2023-12-13: 1250 [IU]/h via INTRAVENOUS
  Administered 2023-12-14: 1450 [IU]/h via INTRAVENOUS
  Administered 2023-12-14: 1700 [IU]/h via INTRAVENOUS
  Administered 2023-12-15 – 2023-12-16 (×2): 1850 [IU]/h via INTRAVENOUS
  Filled 2023-12-13 (×5): qty 250

## 2023-12-13 NOTE — Progress Notes (Signed)
 PHARMACY - ANTICOAGULATION CONSULT NOTE  Pharmacy Consult for heparin  Indication: chest pain/ACS  Allergies  Allergen Reactions   Codeine Nausea And Vomiting   Lortab [Hydrocodone -Acetaminophen ] Nausea And Vomiting    Patient Measurements: Height: 5' 7 (170.2 cm) Weight: 84 kg (185 lb 3 oz) IBW/kg (Calculated) : 66.1 HEPARIN  DW (KG): 82  Vital Signs: Temp: 98.4 F (36.9 C) (11/21 1937) Temp Source: Oral (11/21 1937) BP: 140/86 (11/21 1937) Pulse Rate: 82 (11/21 1937)  Labs: Recent Labs    12/11/23 0352 12/11/23 1320 12/12/23 0415 12/13/23 0412 12/13/23 1445 12/13/23 2131  HGB 7.4* 7.1* 7.2* 7.0* 8.6*  --   HCT 23.7* 22.0* 22.4* 21.7* 25.4*  --   PLT 134* 121* 131* 136*  --   --   HEPARINUNFRC <0.10* 0.99*  --   --   --  0.12*  CREATININE 5.90* 7.00*  --  7.82*  --   --     Estimated Creatinine Clearance: 10.3 mL/min (A) (by C-G formula based on SCr of 7.82 mg/dL (H)).   Assessment: 64 y/o M initially presented to the ED with chest pain. Has ESRD on HD. Was started on heparin  and then stopped until this morning. Per Cardiology >ECG with ST depressions (however he has had ST depressions on prior ECGs). However, troponins did significantly rise and plateau in a pattern which is concerning for an ACS event and inconsistent with his prior chronically elevated troponin. Plan now is to restart heparin  for 48 hours and consider ischemic evaluation this hospitalization. Unfortunately, given his bacteremia he is not a candidate for LHC at this time. Eventual plan for TAVR, plan for TEE, hut waiting until after 48 hours of heparin .  Pharmacy consulted to initiate/manage heparin  infusion.  Heparin  level subtherapeutic (0.12) on infusion at 1250 units/hr. No issues with line or bleeding reported per RN.  Goal of Therapy:  Heparin  level 0.3-0.7 units/ml Monitor platelets by anticoagulation protocol: Yes   Plan:  Bolus hepairn 2500 units/hr Increase heparin  infusion to 1450  units/hr F/u 8 hr heparin  level  Thank you for allowing pharmacy to be a part of this patient's care.   Vito Ralph, PharmD, BCPS Please see amion for complete clinical pharmacist phone list 12/13/2023 10:31 PM

## 2023-12-13 NOTE — Progress Notes (Signed)
 [facility] Progress Note   Subjective:  Seen in [facility]. On dialysis.  Does endorse sob, some chest pain overnight, improved this am.  Hgb 7.0 - will order 1 u prbcs today    Objective Vitals:   12/13/23 0746 12/13/23 0752 12/13/23 0800 12/13/23 0831  BP: (!) 168/79  (!) 162/77 (!) 144/65  Pulse: 84 82 81 84  Resp: (!) 24 (!) 24 (!) 24 19  Temp: 98.2 F (36.8 C)     TempSrc:      SpO2: 100% 100% 100% 100%  Weight: 82.7 kg     Height:        Additional Objective Labs: Basic Metabolic Panel: Recent Labs  Lab 12/10/23 1004 12/11/23 0352 12/11/23 1320 12/13/23 0412  NA 136 134*  --  134*  K 4.8 4.2  --  4.2  CL 93* 93*  --  94*  CO2 25 25  --  25  GLUCOSE 121* 149*  --  114*  BUN 20 27*  --  37*  CREATININE 6.47* 5.90* 7.00* 7.82*  CALCIUM  8.3* 8.6*  --  9.1  PHOS 4.4 4.6  --  6.7*   CBC: Recent Labs  Lab 12/10/23 1711 12/11/23 0352 12/11/23 1320 12/12/23 0415 12/13/23 0412  WBC 5.8 4.1 4.0 3.9* 3.5*  NEUTROABS 4.9 2.9  --   --   --   HGB 7.4* 7.4* 7.1* 7.2* 7.0*  HCT 23.1* 23.7* 22.0* 22.4* 21.7*  MCV 97.5 99.6 98.7 97.4 96.4  PLT 134* 134* 121* 131* 136*   Blood Culture    Component Value Date/Time   SDES BLOOD RIGHT ARM 12/12/2023 1104   SDES BLOOD RIGHT ARM 12/12/2023 1104   SPECREQUEST  12/12/2023 1104    BOTTLES DRAWN AEROBIC AND ANAEROBIC Blood Culture adequate volume   SPECREQUEST  12/12/2023 1104    BOTTLES DRAWN AEROBIC AND ANAEROBIC Blood Culture adequate volume   CULT  12/12/2023 1104    NO GROWTH < 24 HOURS Performed at [facility], [address], [place], [place] [address]    CULT  12/12/2023 1104    NO GROWTH < 24 HOURS Performed at [facility], [address], [place], [place] [address]    REPTSTATUS PENDING 12/12/2023 1104   REPTSTATUS PENDING 12/12/2023 1104     Physical Exam General: chronically ill appearing, nad  Heart: RRR, +SEM Lungs: Coarse breath sounds  Abdomen: non  tender Extremities: trace LE edema  Dialysis Access: L AVF +bruit   Medications:  sodium chloride      cefTRIAXone  (ROCEPHIN )  IV 2 g (12/12/23 2031)   heparin       aspirin  EC  81 mg Oral Daily   Chlorhexidine  Gluconate Cloth  6 each Topical Q0600   lidocaine   1 patch Transdermal Q24H   metoprolol  succinate  25 mg Oral Daily   pravastatin   40 mg Oral QHS   sevelamer  carbonate  4.8 g Oral TID WC    Dialysis Orders:  [facility] MWF  4h   B400   81.3kg   LFA AVF   Heparin  2500 Last OP HD 11/17, post wt 82kg Mircera 75 mcg q 4wks, last 11/12, due 12/10  Assessment/Plan: NSTEMI/Elevated troponin. IV Heparin /nitroglycerin   per cardiology. Cardiology following.  HFrEF 25-30%.  Volume overload on arrival. Optimize volume with HD.  Had extra HD Tues. Now below EDW.  Strep mitis bacteremia. Antibiotics per primary. ID following.  For TEE   ESRD: on HD MWF. Hasn't missed HD. Back  on schedule HD Friday.  HTN. BP acceptable. On metoprolol   Anemia. Hgb 7.0. Transfuse prbcs today.  ESA recently dosed. Will increase frequency to q 2 wks - next ESA due 11/26.  2HPTH. Ca/Phos ok. Continue home meds. Sevelamer  powder binder AS. TCTS consulted. Planning for outpatient TAVR.    Maisie Ronnald Acosta PA-C [facility] 12/13/2023,8:49 AM

## 2023-12-13 NOTE — Progress Notes (Signed)
 PHARMACY - ANTICOAGULATION CONSULT NOTE  Pharmacy Consult for heparin  Indication: chest pain/ACS  Allergies  Allergen Reactions   Codeine Nausea And Vomiting   Lortab [Hydrocodone -Acetaminophen ] Nausea And Vomiting    Patient Measurements: Height: 5' 7 (170.2 cm) Weight: 82.7 kg (182 lb 5.1 oz) IBW/kg (Calculated) : 66.1 HEPARIN  DW (KG): 82  Vital Signs: Temp: 98.2 F (36.8 C) (11/21 0746) Temp Source: Oral (11/20 2355) BP: 162/77 (11/21 0800) Pulse Rate: 84 (11/21 0831)  Labs: Recent Labs    12/10/23 1004 12/10/23 1711 12/10/23 2000 12/11/23 0352 12/11/23 1320 12/12/23 0415 12/13/23 0412  HGB 7.9* 7.4*  --  7.4* 7.1* 7.2* 7.0*  HCT 25.5* 23.1*  --  23.7* 22.0* 22.4* 21.7*  PLT 130* 134*  --  134* 121* 131* 136*  HEPARINUNFRC 0.10*  --  0.68 <0.10* 0.99*  --   --   CREATININE 6.47*  --   --  5.90* 7.00*  --  7.82*  TROPONINIHS 5,953* 5,620*  --   --   --   --   --     Estimated Creatinine Clearance: 10.2 mL/min (A) (by C-G formula based on SCr of 7.82 mg/dL (H)).   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)    Anxiety    Asthma    Asthma    moderate persistent   Carpal tunnel syndrome    Cellulitis and abscess of trunk    Cholelithiasis 07/13/2014   Chronic headaches    Cigarette smoker 07/11/2017   Stopped 2/022  - referred to start Lung cancer screening 04/2021         Debility, unspecified    Dermatophytosis of the body    Dysrhythmia    history of   Edema    End stage renal disease on dialysis (HCC)    MWF; [facility] in [place] (10/21/2014)   Essential hypertension, benign    Generalized anxiety disorder 05/25/2022   GERD (gastroesophageal reflux disease)    Gout, unspecified    History of renal transplant    HTN (hypertension)    Hypertrophy of prostate without  urinary obstruction and other lower urinary tract symptoms (LUTS)    Hypotension, unspecified    Hypoxia 03/21/2023   Impotence of organic origin    Insomnia, unspecified    Localization-related (focal) (partial) epilepsy and epileptic syndromes with complex partial seizures, without mention of intractable epilepsy    12-15-19- Wife states he has NEVER had a seizure    Lumbago    Memory loss    OSA on CPAP    Other and unspecified hyperlipidemia    controlled /managed per wife    Other chronic nonalcoholic liver disease    Other malaise and fatigue    Other nonspecific abnormal serum enzyme levels    Pain in joint, lower leg    Pain in joint, upper arm    Pneumonia several times   PONV (postoperative nausea and vomiting)    Secondary hyperparathyroidism (of renal origin)    Shortness of breath    Sleep apnea    wears cpap    Tension headache    Unspecified constipation    Unspecified essential hypertension    Unspecified hereditary and idiopathic peripheral neuropathy    Unspecified vitamin D  deficiency     Medications:  Medications Prior to  Admission  Medication Sig Dispense Refill Last Dose/Taking   Acetaminophen  (TYLENOL  PO) Take 4 tablets by mouth every 6 (six) hours. 0000, 0600, 1200, 1800.   12/09/2023 Evening   albuterol  (PROVENTIL ) (2.5 MG/3ML) 0.083% nebulizer solution Take 3 mLs (2.5 mg total) by nebulization every 4 (four) hours as needed for wheezing or shortness of breath. 90 mL 0 Past Week   aspirin  EC 81 MG tablet Take 81 mg by mouth daily at 6 PM.   12/09/2023 Evening   diphenhydrAMINE  HCl (BENADRYL  PO) Take 4 tablets by mouth every 6 (six) hours. 0000, 0600, 1200, 1800   Taking   hydrALAZINE  (APRESOLINE ) 50 MG tablet Take 1 tablet (50 mg total) by mouth 3 (three) times daily. 270 tablet 3 12/09/2023 Evening   ipratropium-albuterol  (DUONEB) 0.5-2.5 (3) MG/3ML SOLN Take 3 mLs by nebulization every 6 (six) hours. Please take every 6 hours scheduled for next 5  days then as needed. 360 mL 0 Past Week   oxyCODONE  (ROXICODONE ) 5 MG immediate release tablet Take 1 tablet (5 mg total) by mouth every 4 (four) hours as needed for severe pain (pain score 7-10). 12 tablet 0 Past Month   OXYGEN  Inhale 3 L/min into the lungs continuous.   12/09/2023   pantoprazole  (PROTONIX ) 40 MG tablet Take 40 mg by mouth daily as needed (heartburn).   Past Month   sevelamer  carbonate (RENVELA ) 2.4 g PACK Take 4.8 g by mouth 3 (three) times daily with meals. Also takes with his snacks   Past Week   UNABLE TO FIND Inhale 1 Device into the lungs at bedtime. CPAP   Taking   doxazosin  (CARDURA ) 8 MG tablet Take 8 mg by mouth at bedtime. (Patient not taking: Reported on 12/10/2023)   Not Taking   fluticasone -salmeterol (ADVAIR HFA) 45-21 MCG/ACT inhaler  (Patient not taking: Reported on 12/10/2023) 12 g 11 Not Taking   metoprolol  succinate (TOPROL -XL) 25 MG 24 hr tablet Take 1 tablet (25 mg total) by mouth daily. (Patient not taking: Reported on 12/10/2023) 30 tablet 0 Not Taking   metoprolol  tartrate (LOPRESSOR ) 50 MG tablet Take 50 mg by mouth 2 (two) times daily. (Patient not taking: Reported on 12/10/2023)   Not Taking   multivitamin (RENA-VIT) TABS tablet Take 1 tablet by mouth daily. (Patient not taking: Reported on 12/10/2023) 90 tablet 1 Not Taking   pravastatin  (PRAVACHOL ) 40 MG tablet TAKE 1 TABLET BY MOUTH EVERY DAY IN THE EVENING (Patient not taking: Reported on 12/10/2023) 90 tablet 2 Not Taking   traZODone  (DESYREL ) 50 MG tablet TAKE 0.5-1 TABLET (25-50 MG TOTAL) BY MOUTH AT BEDTIME AS NEEDED. FOR SLEEP (Patient not taking: Reported on 12/10/2023) 90 tablet 1 Not Taking    Assessment: 63 y/o M initially presented to the ED with chest pain. Has ESRD on HD. Was started on heparin  and then stopped until this morning. Per Cardiology >ECG with ST depressions (however he has had ST depressions on prior ECGs). However, troponins did significantly rise and plateau in a pattern  which is concerning for an ACS event and inconsistent with his prior chronically elevated troponin. Plan now is to restart heparin  for 48 hours and consider ischemic evaluation this hospitalization. Unfortunately, given his bacteremia he is not a candidate for LHC at this time. Eventual plan for TAVR, plan for TEE, hut waiting until after 48 hours of heparin .  Pharmacy consulted to initiate/manage heparin  infusion.  Last dose of subQ heparin  was 11/20 @ 2026. CBC stable.  Goal of Therapy:  Heparin  level 0.3-0.7 units/ml Monitor platelets by anticoagulation protocol: Yes   Plan:  Start heparin  infusion at 1250 units/hr Check anti-Xa level in 8 hours and daily while on heparin  Continue to monitor H&H and platelets   Thank you for allowing pharmacy to be a part of this patient's care.   Freedom Lopezperez C Dema Timmons, PharmD 12/13/2023 9:15 AM  **Pharmacist phone directory can be found on [email or web address] listed under [facility]**

## 2023-12-13 NOTE — H&P (View-Only) (Signed)
 Progress Note  Patient Name: Kirk [name] Date of Encounter: 12/13/2023  Primary Cardiologist: Vinie [name], MD   Subjective   Patient seen in HD.  States his chest pain has resolved with the lidocaine  patch.  Feeling good today.  Case discussed with the TEE procedure list who prefers to hold off on performing a TEE until after heparin  drip.  Inpatient Medications    Scheduled Meds:  aspirin  EC  81 mg Oral Daily   Chlorhexidine  Gluconate Cloth  6 each Topical Q0600   heparin   5,000 Units Subcutaneous Q8H   lidocaine   1 patch Transdermal Q24H   metoprolol  succinate  25 mg Oral Daily   pravastatin   40 mg Oral QHS   sevelamer  carbonate  4.8 g Oral TID WC   Continuous Infusions:  sodium chloride      cefTRIAXone  (ROCEPHIN )  IV 2 g (12/12/23 2031)   PRN Meds: acetaminophen , fentaNYL  (SUBLIMAZE ) injection, heparin , melatonin, pentafluoroprop-tetrafluoroeth, polyethylene glycol, prochlorperazine    Vital Signs    Vitals:   12/12/23 2355 12/13/23 0613 12/13/23 0746 12/13/23 0752  BP: (!) 143/55 (!) 179/85 (!) 168/79   Pulse: 82 88 84 82  Resp: 16 16 (!) 24 (!) 24  Temp: 98.1 F (36.7 C)  98.2 F (36.8 C)   TempSrc: Oral     SpO2: 98% 99% 100% 100%  Weight:  81.7 kg 82.7 kg   Height:        Intake/Output Summary (Last 24 hours) at 12/13/2023 0807 Last data filed at 12/12/2023 1300 Gross per 24 hour  Intake 300 ml  Output --  Net 300 ml   Filed Weights   12/12/23 0315 12/13/23 0613 12/13/23 0746  Weight: 80.5 kg 81.7 kg 82.7 kg    Telemetry    Off telemetry  ECG    Normal sinus rhythm with nonspecific ST changes and PVCs- Personally Reviewed  Physical Exam   Physical Exam Vitals and nursing note reviewed.  Constitutional:      Appearance: Normal appearance.  HENT:     Head: Normocephalic and atraumatic.  Eyes:     Conjunctiva/sclera: Conjunctivae normal.  Cardiovascular:     Rate and Rhythm: Normal rate and regular rhythm.  Pulmonary:      Effort: Pulmonary effort is normal.     Breath sounds: Normal breath sounds.  Neurological:     Mental Status: He is alert.      Labs    Chemistry Recent Labs  Lab 12/10/23 1004 12/11/23 0352 12/11/23 1320 12/13/23 0412  NA 136 134*  --  134*  K 4.8 4.2  --  4.2  CL 93* 93*  --  94*  CO2 25 25  --  25  GLUCOSE 121* 149*  --  114*  BUN 20 27*  --  37*  CREATININE 6.47* 5.90* 7.00* 7.82*  CALCIUM  8.3* 8.6*  --  9.1  ALBUMIN  2.8* 2.6*  --  2.4*  GFRNONAA 9* 10* 8* 7*  ANIONGAP 18* 16*  --  15     Hematology Recent Labs  Lab 12/11/23 1320 12/12/23 0415 12/13/23 0412  WBC 4.0 3.9* 3.5*  RBC 2.23* 2.30* 2.25*  HGB 7.1* 7.2* 7.0*  HCT 22.0* 22.4* 21.7*  MCV 98.7 97.4 96.4  MCH 31.8 31.3 31.1  MCHC 32.3 32.1 32.3  RDW 15.9* 15.7* 15.5  PLT 121* 131* 136*    Cardiac EnzymesNo results for input(s): TROPONINI in the last 168 hours. No results for input(s): TROPIPOC in the last 168 hours.  BNP Recent Labs  Lab 12/09/23 2221  BNP 2,729.6*     DDimer No results for input(s): DDIMER in the last 168 hours.   Radiology    No results found.  Cardiac Studies   Echocardiogram 12/10/2023:   1. Left ventricular ejection fraction, by estimation, is 25 to 30%. The  left ventricle has severely decreased function. The left ventricle  demonstrates global hypokinesis. The left ventricular internal cavity size  was severely dilated. Indeterminate  diastolic filling due to E-A fusion.   2. Right ventricular systolic function is low normal. The right  ventricular size is normal.   3. Left atrial size was mildly dilated.   4. Mild mitral valve regurgitation. Moderate mitral annular  calcification.   5. AV leaflets difficult to identify The valve is thickened, calcified  Peak and mean gradients through the valve are 32 and 17 mm Hg  respectively. Dimensionless valve index is 0.35 Overall consistent with  moderate AS. Compared to echo from Sept 2025,  mean  gradient is mildly decreased (22 to 17 mm HG).. The aortic valve has  an indeterminant number of cusps. Aortic valve regurgitation is not  visualized.   Comparison(s): The left ventricular function is unchanged.   LHC 10/04/23: Angiographically minimal CAD with no change from prior catheterization. Moderate-severely reduced LVEF (estimated 30 to 35% with global hypokinesis) with EDP of 16 to 18 mmHg. => Nonischemic Cardiomyopathy as previously indicated       Patient Profile     63 y.o. 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  His chest pain was initially thought to be due to hypotension from HD in the setting of severe aortic stenosis however he did have an elevated troponin and was initially on heparin  briefly.  He received a nitroglycerin  drip without improvement in symptoms.  Nitroglycerin  was discontinued and a lidocaine  patch was ordered with resolution of symptoms.  Assessment & Plan   NSTEMI, type I vs type II-chest pain initially thought to be due to hypotension during HD in the setting of low-flow low gradient severe aortic stenosis with a recent unremarkable left heart catheterization (10/04/23) and a history of chronic HD related chest pain (noted on patient's primary cardiologist notes). Echocardiogram showed unchanged left ventricular function however is significantly more dilated compared to prior. ECG with ST depressions, however similar ST depressions noted on  prior ECGs. Also anemic. Pain did not improve despite nitroglycerin  drip and has improved overnight with a lidocaine  patch with discontinuation of nitroglycerin  drip. Despite these factors, his troponin did significantly rise and plateau which is inconsistent with his prior chronically elevated troponin and therefore cannot rule out an ACS event.  Therefore, will restart heparin  for 48 hours and consider ischemic evaluation this hospitalization.  Unfortunately, given his bacteremia he is not a candidate at this time for left heart catheterization (discussed with intervention colleagues).  Continue aspirin  and statin. Strep mitis bacteremia-discussed with TEE proceduralist today who prefers to hold off on performing the TEE until at least Monday until he undergoes 48 hours of heparin   and potentially ischemic evaluation. ESRD on HD (M, W, F) secondary to Alport syndrome with failed renal transplant-seen in dialysis today Severe low-flow low gradient aortic stenosis-TAVR delayed due to bacteremia.  Continue to follow with cardiothoracic surgery. Acute on chronic HFrEF-volume management per nephrology.  Metoprolol  succinate increased to 25 mg. Nonischemic cardiomyopathy Paroxysmal atrial fibrillation not on anticoagulant due to history of SDH Prolonged QTc Anemia Hypertension  Time spent coordinating care: 55 minutes     For questions or updates, please contact [facility] Please consult [email or web address] for contact info under        Signed, Emeline Calender, DO 12/13/2023, 8:07 AM

## 2023-12-13 NOTE — Plan of Care (Signed)

## 2023-12-13 NOTE — Anesthesia Preprocedure Evaluation (Signed)
 Anesthesia Evaluation  Patient identified by MRN, date of birth, ID band Patient awake    Reviewed: Allergy & Precautions, NPO status , Patient's Chart, lab work & pertinent test results  History of Anesthesia Complications (+) PONV and history of anesthetic complications  Airway Mallampati: III  TM Distance: >3 FB Neck ROM: Full    Dental  (+) Edentulous Upper, Edentulous Lower, Dental Advisory Given   Pulmonary asthma , sleep apnea and Continuous Positive Airway Pressure Ventilation , COPD, former smoker   Pulmonary exam normal breath sounds clear to auscultation       Cardiovascular hypertension, Pt. on home beta blockers and Pt. on medications + Past MI and +CHF  Normal cardiovascular exam+ Valvular Problems/Murmurs AS  Rhythm:Regular Rate:Normal  TTE 11/2023  1. Left ventricular ejection fraction, by estimation, is 25 to 30%. The  left ventricle has severely decreased function. The left ventricle  demonstrates global hypokinesis. The left ventricular internal cavity size  was severely dilated. Indeterminate  diastolic filling due to E-A fusion.   2. Right ventricular systolic function is low normal. The right  ventricular size is normal.   3. Left atrial size was mildly dilated.   4. Mild mitral valve regurgitation. Moderate mitral annular  calcification.   5. AV leaflets difficult to identify The valve is thickened, calcified  Peak and mean gradients through the valve are 32 and 17 mm Hg  respectively. Dimensionless valve index is 0.35 Overall consistent with  moderate AS. Compared to echo from Sept 2025,  mean gradient is mildly decreased (22 to 17 mm HG).. The aortic valve has  an indeterminant number of cusps. Aortic valve regurgitation is not  visualized.   Cath 2025  Angiographically minimal CAD with no change from prior catheterization.  Moderate-severely reduced LVEF (estimated 30 to 35% with global  hypokinesis) with EDP of 16 to 18 mmHg. => Nonischemic Cardiomyopathy as previously indicated      Neuro/Psych  Headaches, Seizures -,  PSYCHIATRIC DISORDERS Anxiety Depression     SDH     GI/Hepatic Neg liver ROS,GERD  Medicated,,  Endo/Other  diabetes    Renal/GU CRF, ESRF, Dialysis and ARFRenal diseaseLast dialysis yesterday 12/15/23     Musculoskeletal negative musculoskeletal ROS (+)    Abdominal   Peds  Hematology  (+) Blood dyscrasia, anemia Lab Results      Component                Value               Date                      WBC                      5.1                 12/16/2023                HGB                      8.6 (L)             12/16/2023                HCT                      26.0 (L)            12/16/2023  MCV                      95.2                12/16/2023                PLT                      137 (L)             12/16/2023              Anesthesia Other Findings  63 y.o. male with medical history significant for ESRD secondary to Alport syndrome, prior kidney transplant, on HD MWF, aortic stenosis, nonischemic cardiomyopathy, OSA on CPAP, paroxysmal A-fib, subdural hematoma, who presented to the ER due to chest pain that started while getting hemodialysis 12/09/2023 found to be bacteremic. On heparin  gtt  Reproductive/Obstetrics  Breast cancer                               Anesthesia Physical Anesthesia Plan  ASA: 4  Anesthesia Plan: MAC   Post-op Pain Management: Minimal or no pain anticipated   Induction:   PONV Risk Score and Plan: 2 and Propofol  infusion and Treatment may vary due to age or medical condition  Airway Management Planned: Nasal Cannula and Natural Airway  Additional Equipment: None  Intra-op Plan:   Post-operative Plan:   Informed Consent:   Plan Discussed with: CRNA and Anesthesiologist  Anesthesia Plan Comments:          Anesthesia Quick Evaluation

## 2023-12-13 NOTE — Progress Notes (Addendum)
 Progress Note  Patient Name: Kirk [name] Date of Encounter: 12/13/2023  Primary Cardiologist: Vinie [name], MD   Subjective   Patient seen in HD.  States his chest pain has resolved with the lidocaine  patch.  Feeling good today.  Case discussed with the TEE procedure list who prefers to hold off on performing a TEE until after heparin  drip.  Inpatient Medications    Scheduled Meds:  aspirin  EC  81 mg Oral Daily   Chlorhexidine  Gluconate Cloth  6 each Topical Q0600   heparin   5,000 Units Subcutaneous Q8H   lidocaine   1 patch Transdermal Q24H   metoprolol  succinate  25 mg Oral Daily   pravastatin   40 mg Oral QHS   sevelamer  carbonate  4.8 g Oral TID WC   Continuous Infusions:  sodium chloride      cefTRIAXone  (ROCEPHIN )  IV 2 g (12/12/23 2031)   PRN Meds: acetaminophen , fentaNYL  (SUBLIMAZE ) injection, heparin , melatonin, pentafluoroprop-tetrafluoroeth, polyethylene glycol, prochlorperazine    Vital Signs    Vitals:   12/12/23 2355 12/13/23 0613 12/13/23 0746 12/13/23 0752  BP: (!) 143/55 (!) 179/85 (!) 168/79   Pulse: 82 88 84 82  Resp: 16 16 (!) 24 (!) 24  Temp: 98.1 F (36.7 C)  98.2 F (36.8 C)   TempSrc: Oral     SpO2: 98% 99% 100% 100%  Weight:  81.7 kg 82.7 kg   Height:        Intake/Output Summary (Last 24 hours) at 12/13/2023 0807 Last data filed at 12/12/2023 1300 Gross per 24 hour  Intake 300 ml  Output --  Net 300 ml   Filed Weights   12/12/23 0315 12/13/23 0613 12/13/23 0746  Weight: 80.5 kg 81.7 kg 82.7 kg    Telemetry    Off telemetry  ECG    Normal sinus rhythm with nonspecific ST changes and PVCs- Personally Reviewed  Physical Exam   Physical Exam Vitals and nursing note reviewed.  Constitutional:      Appearance: Normal appearance.  HENT:     Head: Normocephalic and atraumatic.  Eyes:     Conjunctiva/sclera: Conjunctivae normal.  Cardiovascular:     Rate and Rhythm: Normal rate and regular rhythm.  Pulmonary:      Effort: Pulmonary effort is normal.     Breath sounds: Normal breath sounds.  Neurological:     Mental Status: He is alert.      Labs    Chemistry Recent Labs  Lab 12/10/23 1004 12/11/23 0352 12/11/23 1320 12/13/23 0412  NA 136 134*  --  134*  K 4.8 4.2  --  4.2  CL 93* 93*  --  94*  CO2 25 25  --  25  GLUCOSE 121* 149*  --  114*  BUN 20 27*  --  37*  CREATININE 6.47* 5.90* 7.00* 7.82*  CALCIUM  8.3* 8.6*  --  9.1  ALBUMIN  2.8* 2.6*  --  2.4*  GFRNONAA 9* 10* 8* 7*  ANIONGAP 18* 16*  --  15     Hematology Recent Labs  Lab 12/11/23 1320 12/12/23 0415 12/13/23 0412  WBC 4.0 3.9* 3.5*  RBC 2.23* 2.30* 2.25*  HGB 7.1* 7.2* 7.0*  HCT 22.0* 22.4* 21.7*  MCV 98.7 97.4 96.4  MCH 31.8 31.3 31.1  MCHC 32.3 32.1 32.3  RDW 15.9* 15.7* 15.5  PLT 121* 131* 136*    Cardiac EnzymesNo results for input(s): TROPONINI in the last 168 hours. No results for input(s): TROPIPOC in the last 168 hours.  BNP Recent Labs  Lab 12/09/23 2221  BNP 2,729.6*     DDimer No results for input(s): DDIMER in the last 168 hours.   Radiology    No results found.  Cardiac Studies   Echocardiogram 12/10/2023:   1. Left ventricular ejection fraction, by estimation, is 25 to 30%. The  left ventricle has severely decreased function. The left ventricle  demonstrates global hypokinesis. The left ventricular internal cavity size  was severely dilated. Indeterminate  diastolic filling due to E-A fusion.   2. Right ventricular systolic function is low normal. The right  ventricular size is normal.   3. Left atrial size was mildly dilated.   4. Mild mitral valve regurgitation. Moderate mitral annular  calcification.   5. AV leaflets difficult to identify The valve is thickened, calcified  Peak and mean gradients through the valve are 32 and 17 mm Hg  respectively. Dimensionless valve index is 0.35 Overall consistent with  moderate AS. Compared to echo from Sept 2025,  mean  gradient is mildly decreased (22 to 17 mm HG).. The aortic valve has  an indeterminant number of cusps. Aortic valve regurgitation is not  visualized.   Comparison(s): The left ventricular function is unchanged.   LHC 10/04/23: Angiographically minimal CAD with no change from prior catheterization. Moderate-severely reduced LVEF (estimated 30 to 35% with global hypokinesis) with EDP of 16 to 18 mmHg. => Nonischemic Cardiomyopathy as previously indicated       Patient Profile     63 y.o. 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  His chest pain was initially thought to be due to hypotension from HD in the setting of severe aortic stenosis however he did have an elevated troponin and was initially on heparin  briefly.  He received a nitroglycerin  drip without improvement in symptoms.  Nitroglycerin  was discontinued and a lidocaine  patch was ordered with resolution of symptoms.  Assessment & Plan   NSTEMI, type I vs type II-chest pain initially thought to be due to hypotension during HD in the setting of low-flow low gradient severe aortic stenosis with a recent unremarkable left heart catheterization (10/04/23) and a history of chronic HD related chest pain (noted on patient's primary cardiologist notes). Echocardiogram showed unchanged left ventricular function however is significantly more dilated compared to prior. ECG with ST depressions, however similar ST depressions noted on  prior ECGs. Also anemic. Pain did not improve despite nitroglycerin  drip and has improved overnight with a lidocaine  patch with discontinuation of nitroglycerin  drip. Despite these factors, his troponin did significantly rise and plateau which is inconsistent with his prior chronically elevated troponin and therefore cannot rule out an ACS event.  Therefore, will restart heparin  for 48 hours and consider ischemic evaluation this hospitalization.  Unfortunately, given his bacteremia he is not a candidate at this time for left heart catheterization (discussed with intervention colleagues).  Continue aspirin  and statin. Strep mitis bacteremia-discussed with TEE proceduralist today who prefers to hold off on performing the TEE until at least Monday until he undergoes 48 hours of heparin   and potentially ischemic evaluation. ESRD on HD (M, W, F) secondary to Alport syndrome with failed renal transplant-seen in dialysis today Severe low-flow low gradient aortic stenosis-TAVR delayed due to bacteremia.  Continue to follow with cardiothoracic surgery. Acute on chronic HFrEF-volume management per nephrology.  Metoprolol  succinate increased to 25 mg. Nonischemic cardiomyopathy Paroxysmal atrial fibrillation not on anticoagulant due to history of SDH Prolonged QTc Anemia Hypertension  Time spent coordinating care: 55 minutes     For questions or updates, please contact [facility] Please consult [email or web address] for contact info under        Signed, Emeline Calender, DO 12/13/2023, 8:07 AM

## 2023-12-13 NOTE — Plan of Care (Signed)
   Problem: Education: Goal: Knowledge of General Education information will improve Description Including pain rating scale, medication(s)/side effects and non-pharmacologic comfort measures Outcome: Progressing

## 2023-12-13 NOTE — Procedures (Signed)
 HD Note:  Some information was entered later than the data was gathered due to patient care needs. The stated time with the data is accurate.  Received patient in bed to unit.   Alert and oriented.   Informed consent signed and in chart.   Access used: upper left arm fistula Access issues: None  Patient NPO during treatment. Patient tolerated treatment well.   Patient received 1 unit of blood during treatment  TX duration: 3.5 hours  Alert, without acute distress.  Total UF removed: 2655 ml    3000 ml removed with 345 added with blood given.  Hand-off given to patient's nurse.   Transported back to the room   Ayme Short L. Lenon, RN [facility].

## 2023-12-13 NOTE — Progress Notes (Signed)
 PROGRESS NOTE    Kirk [name]  FMW:[id number] DOB: Nov 30, 1960 DOA: 12/09/2023 PCP: Kirk Roxan [name], NP   Brief Narrative:  Kirk [name] is a 63 y.o. 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  He did complete 4 hours of hemodialysis.  His pain was, 10 out of 10 and associated with shortness of breath.  Worse when taking deep breath.  EMS was activated.   In the ER, he received full dose aspirin  324 mg x 1.  His chest pain persisted.  His twelve-lead EKG showed no evidence of acute ischemia.  High-sensitivity troponin 153 with delta, repeat 1098.  EDP discussed the case with cardiology recommended heparin  drip and admit for possible NSTEMI.    Assessment & Plan:   Principal Problem:   NSTEMI (non-ST elevated myocardial infarction) [facility]) Active Problems:   Chest pain   Streptococcal bacteremia  History of severe low-flow low gradient aortic stenosis/elevated troponin: Came in with typical chest pain, no EKG changes however troponins jumped from 153> 1098> 5953> 5620.  Patient started on nitro as well as heparin  drip per cardiology.  Cardiology opines that this is likely demand ischemia as echo shows reduced ejection fracture with global hypokinesis which is no change from previous echo done 2 months ago.  Also, patient recently had CT coronary about 3 weeks ago which showed diffuse aortic atherosclerosis.  Patient received heparin  and nitroglycerin  drip for approximately 48 hours which were discontinued on 12/11/2023 per cardiology's recommendation.  No plans of intervention.  Needs TAVR at some point in time.  Further management per cardiology.  Sepsis secondary to gram-positive bacteremia, not POA: Upon arrival, patient did not have  any signs or symptoms of sepsis.  After admission, patient spiked fever with Tmax of 102.9 at around 4 PM on 12/10/2023 along with tachycardia and tachypnea and blood culture was positive with gram-positive cocci.  Patient started on Rocephin .  Last temperature spike of 100.9 at 7 PM 12/10/2023.  Blood culture now growing Streptococcus mitis/oralis which is pansensitive.  Structural cardiology team was considering TAVR on him outpatient but per them, due to bacteremia, they will have to postpone this.  ID was consulted.  Patient was continued on Rocephin , plans for TEE today.  ESRD on HD/volume overload: Upon arrival to ED, patient received IV fluid bolus and was continued on slow IV fluids.  Typically receives dialysis on Monday Wednesday Friday schedule, due to volume overload, patient received dialysis on Tuesday and then on Wednesday to put him back on the schedule.  Nephrology managing.  History of paroxysmal atrial fibrillation: On Toprol -XL 12.5.  Rate still elevated.  Not on any anticoagulation previously.  Will defer to cardiology.  Chronic combined systolic and diastolic congestive heart failure: Volume management by dialysis.  Essential hypertension: Controlled, continue Toprol -XL.  Hyperlipidemia: Continue statin.  QTc prolongation: Upon admission, QTc was 542.  Optimize electrolytes and await QTc prolonging medications and monitor on telemetry.  DVT prophylaxis: heparin  injection 5,000 Units Start: 12/11/23 1400Heparin   Code Status: Full Code  Family Communication:  None present at bedside.  Plan of care discussed with patient in length and he/she verbalized understanding and agreed with it.  Status is: Inpatient Remains inpatient appropriate because: Bacteremic, needs TEE tomorrow and ID evaluation   Estimated body mass index  is 28.56 kg/m as calculated from the following:   Height as of this encounter: 5' 7 (1.702 m).   Weight as of this encounter: 82.7 kg.    Nutritional  Assessment: Body mass index is 28.56 kg/m.SABRA Seen by dietician.  I agree with the assessment and plan as outlined below: Nutrition Status:        . Skin Assessment: I have examined the patient's skin and I agree with the wound assessment as performed by the wound care RN as outlined below:    Consultants:  Nephrology and cardiology ID Procedures:  As above  Antimicrobials:  Anti-infectives (From admission, onward)    Start     Dose/Rate Route Frequency Ordered Stop   12/10/23 2200  cefTRIAXone  (ROCEPHIN ) 2 g in sodium chloride  0.9 % 100 mL IVPB        2 g 200 mL/hr over 30 Minutes Intravenous Every 24 hours 12/10/23 1519     12/09/23 2200  vancomycin  (VANCOCIN ) IVPB 1000 mg/200 mL premix  Status:  Discontinued        1,000 mg 200 mL/hr over 60 Minutes Intravenous  Once 12/09/23 2151 12/09/23 2153   12/09/23 2200  ceFEPIme  (MAXIPIME ) 2 g in sodium chloride  0.9 % 100 mL IVPB        2 g 200 mL/hr over 30 Minutes Intravenous  Once 12/09/23 2151 12/09/23 2337   12/09/23 2200  metroNIDAZOLE  (FLAGYL ) IVPB 500 mg        500 mg 100 mL/hr over 60 Minutes Intravenous  Once 12/09/23 2151 12/09/23 2342   12/09/23 2200  vancomycin  (VANCOREADY) IVPB 1750 mg/350 mL        1,750 mg 175 mL/hr over 120 Minutes Intravenous  Once 12/09/23 2153 12/10/23 0142         Subjective: Patient seen and examined in dialysis, he denied chest pain but still complains of exertional shortness of breath but overall feels improved.  No new complaint.  He is aware of the plans.  Objective: Vitals:   12/12/23 2355 12/13/23 0613 12/13/23 0746 12/13/23 0752  BP: (!) 143/55 (!) 179/85 (!) 168/79   Pulse: 82 88 84 82  Resp: 16 16 (!) 24 (!) 24  Temp: 98.1 F (36.7 C)  98.2 F (36.8 C)   TempSrc: Oral     SpO2: 98% 99% 100% 100%  Weight:  81.7 kg 82.7 kg   Height:        Intake/Output Summary (Last 24 hours) at 12/13/2023 0803 Last data filed at 12/12/2023 1300 Gross per 24 hour  Intake 300 ml   Output --  Net 300 ml   Filed Weights   12/12/23 0315 12/13/23 0613 12/13/23 0746  Weight: 80.5 kg 81.7 kg 82.7 kg    Examination:  General exam: Appears calm and comfortable  Respiratory system: Clear to auscultation. Respiratory effort normal. Cardiovascular system: S1 & S2 heard, RRR. No JVD, murmurs, rubs, gallops or clicks. No pedal edema. Gastrointestinal system: Abdomen is nondistended, soft and nontender. No organomegaly or masses felt. Normal bowel sounds heard. Central nervous system: Alert and oriented. No focal neurological deficits. Extremities: Symmetric 5 x 5 power. Skin: No rashes, lesions or ulcers.  Psychiatry: Judgement and insight appear normal. Mood & affect appropriate.   Data Reviewed: I have personally reviewed following labs and imaging studies  CBC: Recent Labs  Lab 12/10/23 1711 12/11/23 0352 12/11/23 1320 12/12/23 0415 12/13/23 0412  WBC 5.8 4.1 4.0 3.9* 3.5*  NEUTROABS 4.9 2.9  --   --   --  HGB 7.4* 7.4* 7.1* 7.2* 7.0*  HCT 23.1* 23.7* 22.0* 22.4* 21.7*  MCV 97.5 99.6 98.7 97.4 96.4  PLT 134* 134* 121* 131* 136*   Basic Metabolic Panel: Recent Labs  Lab 12/09/23 2131 12/09/23 2228 12/10/23 1004 12/11/23 0352 12/11/23 1320 12/13/23 0412  NA 135 136 136 134*  --  134*  K 3.4* 3.7 4.8 4.2  --  4.2  CL 92* 93* 93* 93*  --  94*  CO2 28  --  25 25  --  25  GLUCOSE 136* 113* 121* 149*  --  114*  BUN 13 15 20  27*  --  37*  CREATININE 5.37* 5.60* 6.47* 5.90* 7.00* 7.82*  CALCIUM  8.2*  --  8.3* 8.6*  --  9.1  MG  --   --  1.9  --   --   --   PHOS  --   --  4.4 4.6  --  6.7*   GFR: Estimated Creatinine Clearance: 10.2 mL/min (A) (by C-G formula based on SCr of 7.82 mg/dL (H)). Liver Function Tests: Recent Labs  Lab 12/10/23 1004 12/11/23 0352 12/13/23 0412  ALBUMIN  2.8* 2.6* 2.4*   No results for input(s): LIPASE, AMYLASE in the last 168 hours. No results for input(s): AMMONIA in the last 168 hours. Coagulation  Profile: Recent Labs  Lab 12/09/23 2221  INR 1.1   Cardiac Enzymes: No results for input(s): CKTOTAL, CKMB, CKMBINDEX, TROPONINI in the last 168 hours. BNP (last 3 results) No results for input(s): PROBNP in the last 8760 hours. HbA1C: No results for input(s): HGBA1C in the last 72 hours. CBG: Recent Labs  Lab 12/10/23 0929 12/12/23 0827  GLUCAP 112* 141*   Lipid Profile: No results for input(s): CHOL, HDL, LDLCALC, TRIG, CHOLHDL, LDLDIRECT in the last 72 hours. Thyroid  Function Tests: No results for input(s): TSH, T4TOTAL, FREET4, T3FREE, THYROIDAB in the last 72 hours. Anemia Panel: No results for input(s): VITAMINB12, FOLATE, FERRITIN, TIBC, IRON, RETICCTPCT in the last 72 hours. Sepsis Labs: Recent Labs  Lab 12/09/23 2228  LATICACIDVEN 1.0    Recent Results (from the past 240 hours)  Resp panel by RT-PCR (RSV, Flu A&B, Covid) Anterior Nasal Swab     Status: None   Collection Time: 12/09/23 10:21 PM   Specimen: Anterior Nasal Swab  Result Value Ref Range Status   SARS Coronavirus 2 by RT PCR NEGATIVE NEGATIVE Final   Influenza A by PCR NEGATIVE NEGATIVE Final   Influenza B by PCR NEGATIVE NEGATIVE Final    Comment: (NOTE) The Xpert Xpress SARS-CoV-2/FLU/RSV plus assay is intended as an aid in the diagnosis of influenza from Nasopharyngeal swab specimens and should not be used as a sole basis for treatment. Nasal washings and aspirates are unacceptable for Xpert Xpress SARS-CoV-2/FLU/RSV testing.  Fact Sheet for Patients: bloggercourse.com  Fact Sheet for Healthcare Providers: seriousbroker.it  This test is not yet approved or cleared by the [place]  FDA and has been authorized for detection and/or diagnosis of SARS-CoV-2 by FDA under an Emergency Use Authorization (EUA). This EUA will remain in effect (meaning this test can be used) for the duration of  the COVID-19 declaration under Section 564(b)(1) of the Act, 21 U.S.C. section 360bbb-3(b)(1), unless the authorization is terminated or revoked.     Resp Syncytial Virus by PCR NEGATIVE NEGATIVE Final    Comment: (NOTE) Fact Sheet for Patients: bloggercourse.com  Fact Sheet for Healthcare Providers: seriousbroker.it  This test is not yet approved or cleared by the [place]   FDA and has been authorized for detection and/or diagnosis of SARS-CoV-2 by FDA under an Emergency Use Authorization (EUA). This EUA will remain in effect (meaning this test can be used) for the duration of the COVID-19 declaration under Section 564(b)(1) of the Act, 21 U.S.C. section 360bbb-3(b)(1), unless the authorization is terminated or revoked.  Performed at [facility], [address] [place], [place] [address]   Blood Culture (routine x 2)     Status: Abnormal   Collection Time: 12/09/23 10:21 PM   Specimen: BLOOD RIGHT ARM  Result Value Ref Range Status   Specimen Description BLOOD RIGHT ARM  Final   Special Requests   Final    BOTTLES DRAWN AEROBIC AND ANAEROBIC Blood Culture adequate volume   Culture  Setup Time   Final    GRAM POSITIVE COCCI IN CHAINS IN BOTH AEROBIC AND ANAEROBIC BOTTLES CRITICAL RESULT CALLED TO, READ BACK BY AND VERIFIED WITH: PHARMD J.FRENS AT 1400 ON 12/10/2023 BY T.SAAD.  Performed at [facility], [address], [place], [place] [address]    Culture STREPTOCOCCUS MITIS/ORALIS (A)  Final   Report Status 12/12/2023 FINAL  Final   Organism ID, Bacteria STREPTOCOCCUS MITIS/ORALIS  Final      Susceptibility   Streptococcus mitis/oralis - MIC*    PENICILLIN <=0.06 SENSITIVE Sensitive     CEFTRIAXONE  <=0.12 SENSITIVE Sensitive     LEVOFLOXACIN  1 SENSITIVE Sensitive     VANCOMYCIN  0.5 SENSITIVE Sensitive     * STREPTOCOCCUS MITIS/ORALIS  Blood Culture (routine x 2)     Status: Abnormal   Collection  Time: 12/09/23 10:21 PM   Specimen: BLOOD RIGHT ARM  Result Value Ref Range Status   Specimen Description BLOOD RIGHT ARM  Final   Special Requests   Final    BOTTLES DRAWN AEROBIC AND ANAEROBIC Blood Culture adequate volume   Culture  Setup Time   Final    GRAM POSITIVE COCCI IN CHAINS IN BOTH AEROBIC AND ANAEROBIC BOTTLES CRITICAL VALUE NOTED.  VALUE IS CONSISTENT WITH PREVIOUSLY REPORTED AND CALLED VALUE.    Culture (A)  Final    STREPTOCOCCUS MITIS/ORALIS SUSCEPTIBILITIES PERFORMED ON PREVIOUS CULTURE WITHIN THE LAST 5 DAYS. Performed at [facility], [address], [place], [place] [address]    Report Status 12/12/2023 FINAL  Final  Blood Culture ID Panel (Reflexed)     Status: Abnormal   Collection Time: 12/09/23 10:21 PM  Result Value Ref Range Status   Enterococcus faecalis NOT DETECTED NOT DETECTED Final   Enterococcus Faecium NOT DETECTED NOT DETECTED Final   Listeria monocytogenes NOT DETECTED NOT DETECTED Final   Staphylococcus species NOT DETECTED NOT DETECTED Final   Staphylococcus aureus (BCID) NOT DETECTED NOT DETECTED Final   Staphylococcus epidermidis NOT DETECTED NOT DETECTED Final   Staphylococcus lugdunensis NOT DETECTED NOT DETECTED Final   Streptococcus species DETECTED (A) NOT DETECTED Final    Comment: Not Enterococcus species, Streptococcus agalactiae, Streptococcus pyogenes, or Streptococcus pneumoniae. CRITICAL RESULT CALLED TO, READ BACK BY AND VERIFIED WITH: PHARMD J.FRENS AT 1400 ON 12/10/2023 BY T.SAAD.     Streptococcus agalactiae NOT DETECTED NOT DETECTED Final   Streptococcus pneumoniae NOT DETECTED NOT DETECTED Final   Streptococcus pyogenes NOT DETECTED NOT DETECTED Final   A.calcoaceticus-baumannii NOT DETECTED NOT DETECTED Final   Bacteroides fragilis NOT DETECTED NOT DETECTED Final   Enterobacterales NOT DETECTED NOT DETECTED Final   Enterobacter cloacae complex NOT DETECTED NOT DETECTED Final   Escherichia coli NOT DETECTED  NOT  DETECTED Final   Klebsiella aerogenes NOT DETECTED NOT DETECTED Final   Klebsiella oxytoca NOT DETECTED NOT DETECTED Final   Klebsiella pneumoniae NOT DETECTED NOT DETECTED Final   Proteus species NOT DETECTED NOT DETECTED Final   Salmonella species NOT DETECTED NOT DETECTED Final   Serratia marcescens NOT DETECTED NOT DETECTED Final   Haemophilus influenzae NOT DETECTED NOT DETECTED Final   Neisseria meningitidis NOT DETECTED NOT DETECTED Final   Pseudomonas aeruginosa NOT DETECTED NOT DETECTED Final   Stenotrophomonas maltophilia NOT DETECTED NOT DETECTED Final   Candida albicans NOT DETECTED NOT DETECTED Final   Candida auris NOT DETECTED NOT DETECTED Final   Candida glabrata NOT DETECTED NOT DETECTED Final   Candida krusei NOT DETECTED NOT DETECTED Final   Candida parapsilosis NOT DETECTED NOT DETECTED Final   Candida tropicalis NOT DETECTED NOT DETECTED Final   Cryptococcus neoformans/gattii NOT DETECTED NOT DETECTED Final    Comment: Performed at [facility], [address] [place], [place] [address]     Radiology Studies: No results found.   Scheduled Meds:  aspirin  EC  81 mg Oral Daily   Chlorhexidine  Gluconate Cloth  6 each Topical Q0600   heparin   5,000 Units Subcutaneous Q8H   lidocaine   1 patch Transdermal Q24H   metoprolol  succinate  25 mg Oral Daily   pravastatin   40 mg Oral QHS   sevelamer  carbonate  4.8 g Oral TID WC   Continuous Infusions:  sodium chloride      cefTRIAXone  (ROCEPHIN )  IV 2 g (12/12/23 2031)     LOS: 3 days   Fredia Skeeter, MD [facility]  12/13/2023, 8:03 AM   *Please note that this is a verbal dictation therefore any spelling or grammatical errors are due to the Dragon Medical One system interpretation.  Please page via Amion and do not message via secure chat for urgent patient care matters. Secure chat can be used for non urgent patient care matters.  How to contact the [facility] Attending or Consulting provider 7A - 7P  or covering provider during after hours 7P -7A, for this patient?  Check the care team in [facility] and look for a) attending/consulting [facility] provider listed and b) the [facility] team listed. Page or secure chat 7A-7P. Log into [email or web address] and use [place]'s universal password to access. If you do not have the password, please contact the hospital operator. Locate the [facility] provider you are looking for under [facility] and page to a number that you can be directly reached. If you still have difficulty reaching the provider, please page the [facility] (Director on Call) for the Hospitalists listed on amion for assistance.

## 2023-12-14 ENCOUNTER — Other Ambulatory Visit: Payer: Self-pay

## 2023-12-14 DIAGNOSIS — I214 Non-ST elevation (NSTEMI) myocardial infarction: Secondary | ICD-10-CM | POA: Diagnosis not present

## 2023-12-14 LAB — BPAM RBC
Blood Product Expiration Date: 202512122359
ISSUE DATE / TIME: 202511211014
Unit Type and Rh: 6200

## 2023-12-14 LAB — CBC
HCT: 26.5 % — ABNORMAL LOW (ref 39.0–52.0)
Hemoglobin: 8.7 g/dL — ABNORMAL LOW (ref 13.0–17.0)
MCH: 30.9 pg (ref 26.0–34.0)
MCHC: 32.8 g/dL (ref 30.0–36.0)
MCV: 94 fL (ref 80.0–100.0)
Platelets: 159 K/uL (ref 150–400)
RBC: 2.82 MIL/uL — ABNORMAL LOW (ref 4.22–5.81)
RDW: 16.9 % — ABNORMAL HIGH (ref 11.5–15.5)
WBC: 4.4 K/uL (ref 4.0–10.5)
nRBC: 0 % (ref 0.0–0.2)

## 2023-12-14 LAB — TYPE AND SCREEN
ABO/RH(D): A POS
Antibody Screen: NEGATIVE
Unit division: 0

## 2023-12-14 LAB — TROPONIN I (HIGH SENSITIVITY)
Troponin I (High Sensitivity): 1107 ng/L (ref <18)
Troponin I (High Sensitivity): 971 ng/L (ref <18)

## 2023-12-14 LAB — HEPARIN LEVEL (UNFRACTIONATED)
Heparin Unfractionated: 0.14 [IU]/mL — ABNORMAL LOW (ref 0.30–0.70)
Heparin Unfractionated: 0.72 [IU]/mL — ABNORMAL HIGH (ref 0.30–0.70)

## 2023-12-14 LAB — GLUCOSE, CAPILLARY: Glucose-Capillary: 130 mg/dL — ABNORMAL HIGH (ref 70–99)

## 2023-12-14 MED ORDER — CHLORHEXIDINE GLUCONATE CLOTH 2 % EX PADS: 6.0000 | MEDICATED_PAD | Freq: Every day | CUTANEOUS | Status: DC

## 2023-12-14 MED ORDER — HEPARIN BOLUS VIA INFUSION
2000.0000 [IU] | Freq: Once | INTRAVENOUS | Status: AC
  Administered 2023-12-14: 2000 [IU] via INTRAVENOUS
  Filled 2023-12-14: qty 2000

## 2023-12-14 MED ORDER — DARBEPOETIN ALFA 100 MCG/0.5ML IJ SOSY
100.0000 ug | PREFILLED_SYRINGE | INTRAMUSCULAR | Status: DC
  Filled 2023-12-14: qty 0.5

## 2023-12-14 MED ORDER — LISINOPRIL 20 MG PO TABS
20.0000 mg | ORAL_TABLET | Freq: Every day | ORAL | Status: DC
  Administered 2023-12-14 – 2023-12-16 (×2): 20 mg via ORAL
  Filled 2023-12-14 (×3): qty 1

## 2023-12-14 MED ORDER — CALCIUM CARBONATE ANTACID 500 MG PO CHEW
1.0000 | CHEWABLE_TABLET | Freq: Two times a day (BID) | ORAL | Status: DC | PRN
  Administered 2023-12-16 – 2023-12-17 (×2): 200 mg via ORAL
  Filled 2023-12-14 (×2): qty 1

## 2023-12-14 NOTE — Progress Notes (Signed)
 Progress Note  Patient Name: Kirk [name] Date of Encounter: 12/14/2023 [facility] Cardiologist: Vinie [name], MD   Interval Summary   The patient was returning from his wife's hospital room upon my visit, she is hospitalized down the hall from him.  He was ambulating with his IV pole and appeared to have multiple presyncopal episodes.  He also describes heartburn with activity as well as dyspnea and presyncope. EF 25%.  With activity I suspect he has a combination of low output, moderate low-flow low gradient AAS, and possibly ischemia although most recent coronary angiography demonstrates minimal CAD despite coronary artery calcium  score of 1900.  Blood glucose checked given multiple presyncopal episodes, 130.  EKG repeated with LVH with repolarization abnormalities, not significantly different than EKG from 12/12/2023.  Blood pressure is stable and moderately elevated 160/75.  Vital Signs Vitals:   12/13/23 2340 12/14/23 0556 12/14/23 0914 12/14/23 1006  BP: (!) 160/82 (!) 165/76 (!) 168/72 (!) 160/75  Pulse: 80 78 81 81  Resp: 16 15 17    Temp: 98.3 F (36.8 C) 98.1 F (36.7 C) 98.3 F (36.8 C)   TempSrc: Oral Oral Oral   SpO2: 100% 97% 100%   Weight:  79.4 kg    Height:        Intake/Output Summary (Last 24 hours) at 12/14/2023 1245 Last data filed at 12/14/2023 1009 Gross per 24 hour  Intake 1188.46 ml  Output --  Net 1188.46 ml      12/14/2023    5:56 AM 12/13/2023   11:41 AM 12/13/2023    7:46 AM  Last 3 Weights  Weight (lbs) 175 lb 185 lb 3 oz 182 lb 5.1 oz  Weight (kg) 79.379 kg 84 kg 82.7 kg      Telemetry/ECG  Sinus rhythm- Personally Reviewed  Physical Exam  GEN: Appears presyncopal, with angina Neck: No JVD Cardiac: RRR, no murmurs, rubs, or gallops.  Respiratory: Coarse bilateral breath sounds GI: Soft, nontender, non-distended  MS: No edema.  Warm on exam with 3/4 right lower extremity DP pulses  Assessment & Plan    NSTEMI LFLG AS - Patient recently had a coronary angiogram September 2025 demonstrating minimal CAD despite severely elevated coronary artery calcium  score.  Patient now in the hospital with strep mitis bacteremia anticipating TEE on Monday, receiving antibiotics.  He is ESRD on IHD, and initially had a troponin elevation 153 > 1098 > 5953>5620.  This is concerning for ACS despite end-stage renal disease.  Echo grossly unchanged with ejection fraction 25 to 30% and grossly normal right ventricle.  Moderate low-flow low gradient AS by echo and aortic valve calcium  score on CT is around 1200, which is concordant. - I am concerned that the patient is becoming increasingly symptomatic and when exerting himself is experiencing angina with complicating factors of low output and moderate AS.  Aortic valve intervention not planned currently due to bacteremia. - Will plan to repeat troponins and consider coronary angiography on Monday given continued symptoms. -Patient should be on relative bedrest given exertional angina and presyncope -With presyncope I am somewhat concerned about giving the patient nitroglycerin  for angina particularly with aortic valve stenosis that is at least moderate and may be hemodynamically significant given low flow low gradient.  -challenging situation. Observe closely. - continue IV heparin .   ESRD on IHD - HD MWF for volume management.   CRITICAL CARE Performed by: Soyla Merck, MD   Total critical care time: 35 minutes   Critical care  time was exclusive of separately billable procedures and treating other patients.   Critical care was necessary to treat or prevent imminent or life-threatening deterioration.   Critical care was time spent personally by me (independent of APPs or residents) on the following activities: development of treatment plan with patient and/or surrogate as well as nursing, discussions with consultants, evaluation of patient's response to  treatment, examination of patient, obtaining history from patient or surrogate, ordering and performing treatments and interventions, ordering and review of laboratory studies, ordering and review of radiographic studies, pulse oximetry and re-evaluation of patient's condition.     For questions or updates, please contact [facility] Please consult [email or web address] for contact info under         Signed, Rexanna Louthan A Evone Arseneau, MD

## 2023-12-14 NOTE — Progress Notes (Signed)
 PHARMACY - ANTICOAGULATION CONSULT NOTE  Pharmacy Consult for heparin  Indication: chest pain/ACS  Allergies  Allergen Reactions   Codeine Nausea And Vomiting   Lortab [Hydrocodone -Acetaminophen ] Nausea And Vomiting    Patient Measurements: Height: 5' 7 (170.2 cm) Weight: 79.4 kg (175 lb) IBW/kg (Calculated) : 66.1 HEPARIN  DW (KG): 82  Vital Signs: Temp: 97.8 F (36.6 C) (11/22 1931) Temp Source: Oral (11/22 1931) BP: 153/79 (11/22 1931) Pulse Rate: 74 (11/22 1931)  Labs: Recent Labs    12/12/23 0415 12/13/23 0412 12/13/23 1445 12/13/23 2131 12/14/23 0650 12/14/23 1257 12/14/23 1457 12/14/23 2004  HGB 7.2* 7.0* 8.6*  --  8.7*  --   --   --   HCT 22.4* 21.7* 25.4*  --  26.5*  --   --   --   PLT 131* 136*  --   --  159  --   --   --   HEPARINUNFRC  --   --   --  0.12* 0.14*  --   --  0.72*  CREATININE  --  7.82*  --   --   --   --   --   --   TROPONINIHS  --   --   --   --   --  1,107* 971*  --     Estimated Creatinine Clearance: 10 mL/min (A) (by C-G formula based on SCr of 7.82 mg/dL (H)).   Assessment: 63 y/o M initially presented to the ED with chest pain. Has ESRD on HD. Was started on heparin  and then stopped until this morning.  Per Cardiology >ECG with ST depressions (he has had ST depressions on prior ECGs). However, troponins did significantly rise and plateau in a pattern concerning for an ACS event and inconsistent with his prior chronically elevated troponin. Plan now is to continue heparin  for 48 hours   Pharmacy consulted to manage heparin  infusion. -heparin  level= 0.72 on 1700 units/hr    Goal of Therapy:  Heparin  level 0.3-0.7 units/ml Monitor platelets by anticoagulation protocol: Yes   Plan:  -Since just slightly above goal, will continue heparin  1700 units/hr -heparin  level and CBC in am  Prentice Poisson, PharmD Clinical Pharmacist **Pharmacist phone directory can now be found on [email or web address] ([email or web address]).  Listed under [facility]  Pharmacy.

## 2023-12-14 NOTE — Plan of Care (Signed)
  Problem: Education: Goal: Knowledge of General Education information will improve Description: Including pain rating scale, medication(s)/side effects and non-pharmacologic comfort measures Outcome: Progressing   Problem: Health Behavior/Discharge Planning: Goal: Ability to manage health-related needs will improve Outcome: Progressing   Problem: Clinical Measurements: Goal: Ability to maintain clinical measurements within normal limits will improve Outcome: Progressing   Problem: Clinical Measurements: Goal: Diagnostic test results will improve Outcome: Progressing   Problem: Clinical Measurements: Goal: Will remain free from infection Outcome: Progressing   Problem: Safety: Goal: Ability to remain free from injury will improve Outcome: Progressing   Problem: Skin Integrity: Goal: Risk for impaired skin integrity will decrease Outcome: Progressing

## 2023-12-14 NOTE — Progress Notes (Signed)
 PHARMACY - ANTICOAGULATION CONSULT NOTE  Pharmacy Consult for heparin  Indication: chest pain/ACS  Allergies  Allergen Reactions   Codeine Nausea And Vomiting   Lortab [Hydrocodone -Acetaminophen ] Nausea And Vomiting    Patient Measurements: Height: 5' 7 (170.2 cm) Weight: 79.4 kg (175 lb) IBW/kg (Calculated) : 66.1 HEPARIN  DW (KG): 82  Vital Signs: Temp: 98.1 F (36.7 C) (11/22 0556) Temp Source: Oral (11/22 0556) BP: 165/76 (11/22 0556) Pulse Rate: 78 (11/22 0556)  Labs: Recent Labs    12/11/23 1320 12/12/23 0415 12/13/23 0412 12/13/23 1445 12/13/23 2131 12/14/23 0650  HGB 7.1* 7.2* 7.0* 8.6*  --  8.7*  HCT 22.0* 22.4* 21.7* 25.4*  --  26.5*  PLT 121* 131* 136*  --   --  159  HEPARINUNFRC 0.99*  --   --   --  0.12* 0.14*  CREATININE 7.00*  --  7.82*  --   --   --     Estimated Creatinine Clearance: 10 mL/min (A) (by C-G formula based on SCr of 7.82 mg/dL (H)).   Assessment: 63 y/o M initially presented to the ED with chest pain. Has ESRD on HD. Was started on heparin  and then stopped until this morning.  Per Cardiology >ECG with ST depressions (he has had ST depressions on prior ECGs). However, troponins did significantly rise and plateau in a pattern concerning for an ACS event and inconsistent with his prior chronically elevated troponin. Plan now is to restart heparin  for 48 hours and consider ischemic evaluation this hospitalization. Unfortunately, he is not a candidate for LHC at this time given his bacteremia. Eventual plan for TAVR, plan for TEE, but waiting until after 48 hours of heparin .  Pharmacy consulted to initiate/manage heparin  infusion.  Heparin  level subtherapeutic (0.14) on infusion at 1450 units/hr. No issues with line or bleeding reported per RN. CBC stable: Hgb 8.7, Plt 159. Will increase  Goal of Therapy:  Heparin  level 0.3-0.7 units/ml Monitor platelets by anticoagulation protocol: Yes   Plan:  Bolus hepairn 2000 units/hr Increase  heparin  infusion to 1700 units/hr F/u 8 hr heparin  level  Thank you for allowing pharmacy to be a part of this patient's care.   Elma Fail, PharmD PGY1 Clinical Pharmacist [facility]  12/14/2023 9:01 AM

## 2023-12-14 NOTE — Progress Notes (Signed)
 [facility] Progress Note   Subjective:   Patient seen and examined at bedside.  Reports chest pain is better today (1/10)  but continues to feel bad.  Denies SOB, abdominal pain, nausea, vomiting, dizziness, fever, chills and fatigue.   States he can't describe it but he just feels bad.  Reports his wife is hospitalized down the hall.  Hoping to feel up to going to visit her.      Objective Vitals:   12/13/23 2340 12/14/23 0556 12/14/23 0914 12/14/23 1006  BP: (!) 160/82 (!) 165/76 (!) 168/72 (!) 160/75  Pulse: 80 78 81 81  Resp: 16 15 17    Temp: 98.3 F (36.8 C) 98.1 F (36.7 C) 98.3 F (36.8 C)   TempSrc: Oral Oral Oral   SpO2: 100% 97% 100%   Weight:  79.4 kg    Height:       Physical Exam General:chronically ill appearing male in NAD Heart:RRR, no mrg Lungs:mostly CTAB, nml WOB on 2L O2 Abdomen:soft, NTND Extremities:no LE edema Dialysis Access: LU AVF   Filed Weights   12/13/23 0746 12/13/23 1141 12/14/23 0556  Weight: 82.7 kg 84 kg 79.4 kg    Intake/Output Summary (Last 24 hours) at 12/14/2023 1453 Last data filed at 12/14/2023 1009 Gross per 24 hour  Intake 981.77 ml  Output --  Net 981.77 ml    Additional Objective Labs: Basic Metabolic Panel: Recent Labs  Lab 12/10/23 1004 12/11/23 0352 12/11/23 1320 12/13/23 0412  NA 136 134*  --  134*  K 4.8 4.2  --  4.2  CL 93* 93*  --  94*  CO2 25 25  --  25  GLUCOSE 121* 149*  --  114*  BUN 20 27*  --  37*  CREATININE 6.47* 5.90* 7.00* 7.82*  CALCIUM  8.3* 8.6*  --  9.1  PHOS 4.4 4.6  --  6.7*   Liver Function Tests: Recent Labs  Lab 12/10/23 1004 12/11/23 0352 12/13/23 0412  ALBUMIN  2.8* 2.6* 2.4*   CBC: Recent Labs  Lab 12/10/23 1711 12/11/23 0352 12/11/23 1320 12/12/23 0415 12/13/23 0412 12/13/23 1445 12/14/23 0650  WBC 5.8 4.1 4.0 3.9* 3.5*  --  4.4  NEUTROABS 4.9 2.9  --   --   --   --   --   HGB 7.4* 7.4* 7.1* 7.2* 7.0* 8.6* 8.7*  HCT 23.1* 23.7* 22.0* 22.4* 21.7*  25.4* 26.5*  MCV 97.5 99.6 98.7 97.4 96.4  --  94.0  PLT 134* 134* 121* 131* 136*  --  159   Blood Culture    Component Value Date/Time   SDES BLOOD RIGHT ARM 12/12/2023 1104   SDES BLOOD RIGHT ARM 12/12/2023 1104   SPECREQUEST  12/12/2023 1104    BOTTLES DRAWN AEROBIC AND ANAEROBIC Blood Culture adequate volume   SPECREQUEST  12/12/2023 1104    BOTTLES DRAWN AEROBIC AND ANAEROBIC Blood Culture adequate volume   CULT  12/12/2023 1104    NO GROWTH 2 DAYS Performed at [facility], [address], [place], [place] [address]    CULT  12/12/2023 1104    NO GROWTH 2 DAYS Performed at [facility], [address] [place], [place] [address]    REPTSTATUS PENDING 12/12/2023 1104   REPTSTATUS PENDING 12/12/2023 1104    CBG: Recent Labs  Lab 12/10/23 0929 12/12/23 0827 12/14/23 1236  GLUCAP 112* 141* 130*    Medications:  cefTRIAXone  (ROCEPHIN )  IV Stopped (12/13/23 2301)   heparin  1,700 Units/hr (  12/14/23 1003)    aspirin  EC  81 mg Oral Daily   Chlorhexidine  Gluconate Cloth  6 each Topical Q0600   lidocaine   1 patch Transdermal Q24H   lisinopril   20 mg Oral Daily   metoprolol  succinate  25 mg Oral Daily   pravastatin   40 mg Oral QHS   sevelamer  carbonate  4.8 g Oral TID WC    Dialysis Orders: [facility] MWF  4h   B400   81.3kg   LFA AVF   Heparin  2500 Last OP HD 11/17, post wt 82kg Mircera 75 mcg q 4wks, last 11/12, due 12/10   Assessment/Plan: NSTEMI/Elevated troponin. IV Heparin /nitroglycerin   -  troponin trending up. Cardiology following closely. May have coronary angiography Monday if ongoing symptoms. Advised he should be on bedrest d/t DOE and presyncope. HFrEF 25-30%.  Volume overload on arrival. Optimize volume with HD.  Had extra HD Tues. Now below EDW.  Strep mitis bacteremia. Antibiotics per primary. ID following.  For TEE   ESRD: on HD MWF. Due to Thanksgiving Holiday will have dialysis on Sunday, Tuesday and Friday this week.   HTN. BP  variable. On metoprolol   Anemia. Hgb improved to 8.7 s/p 1 unit pRBC Transfuse prbcs today.  ESA recently dosed. Will increase frequency to q 2 wks - next ESA due 11/26.  2HPTH. Ca/Phos ok. Continue home meds. Sevelamer  powder binder AS. TCTS consulted. Planning for outpatient TAVR which is now on hold d/t bacteremia. Nutrition - Renal diet w/fluid restrictions.  Manuelita Labella, PA-C [facility] 12/14/2023,2:53 PM  LOS: 4 days

## 2023-12-14 NOTE — Plan of Care (Signed)
  Problem: Education: Goal: Knowledge of General Education information will improve Description: Including pain rating scale, medication(s)/side effects and non-pharmacologic comfort measures Outcome: Progressing   Problem: Health Behavior/Discharge Planning: Goal: Ability to manage health-related needs will improve Outcome: Progressing   Problem: Clinical Measurements: Goal: Ability to maintain clinical measurements within normal limits will improve Outcome: Progressing Goal: Respiratory complications will improve Outcome: Progressing Goal: Cardiovascular complication will be avoided Outcome: Progressing   Problem: Activity: Goal: Risk for activity intolerance will decrease Outcome: Progressing   Problem: Nutrition: Goal: Adequate nutrition will be maintained Outcome: Progressing   Problem: Coping: Goal: Level of anxiety will decrease Outcome: Progressing   Problem: Elimination: Goal: Will not experience complications related to bowel motility Outcome: Progressing   Problem: Pain Managment: Goal: General experience of comfort will improve and/or be controlled Outcome: Progressing   Problem: Safety: Goal: Ability to remain free from injury will improve Outcome: Progressing   Problem: Skin Integrity: Goal: Risk for impaired skin integrity will decrease Outcome: Progressing

## 2023-12-14 NOTE — Progress Notes (Signed)
 PROGRESS NOTE    [name]  FMW:[id number] DOB: 1960-11-14 DOA: 12/09/2023 PCP: Leonarda Roxan [name], NP   Brief Narrative:  Kirk [name] is a 63 y.o. 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  He did complete 4 hours of hemodialysis.  His pain was, 10 out of 10 and associated with shortness of breath.  Worse when taking deep breath.  EMS was activated.   In the ER, he received full dose aspirin  324 mg x 1.  His chest pain persisted.  His twelve-lead EKG showed no evidence of acute ischemia.  High-sensitivity troponin 153 with delta, repeat 1098.  EDP discussed the case with cardiology recommended heparin  drip and admit for possible NSTEMI.    Assessment & Plan:   Principal Problem:   NSTEMI (non-ST elevated myocardial infarction) [facility]) Active Problems:   Chest pain   Streptococcal bacteremia  History of severe low-flow low gradient aortic stenosis/elevated troponin/?ACS: chest pain initially thought to be due to hypotension during HD in the setting of low-flow low gradient severe aortic stenosis with a recent unremarkable left heart catheterization (10/04/23), Also, patient recently had CT coronary about 3 weeks ago which showed diffuse aortic atherosclerosis. and a history of chronic HD related chest pain (noted on patient's primary cardiologist notes). Echocardiogram showed unchanged left ventricular function however is significantly more dilated compared to prior. ECG with ST depressions, however similar ST depressions noted on prior ECGs. Also anemic. Patient received heparin  and nitroglycerin  drip for approximately 48 hours which were discontinued on 12/11/2023 per cardiology's recommendation, although her chest pain did not improve  but then it did improve with lidocaine  patch however his troponin did significantly rise and plateaued which is not consistent with his prior chronically elevated troponin and therefore cardiology now opined that he may have ACS and for that reason, he was restarted on heparin  drip on 12/13/2023 with a plan to continue for another 48 hours and TEE was postponed.   Sepsis secondary to gram-positive bacteremia, not POA: Upon arrival, patient did not have any signs or symptoms of sepsis.  After admission, patient spiked fever with Tmax of 102.9 at around 4 PM on 12/10/2023 along with tachycardia and tachypnea and blood culture was positive with gram-positive cocci.  Patient started on Rocephin .  Last temperature spike of 100.9 at 7 PM 12/10/2023.  Blood culture now growing Streptococcus mitis/oralis which is pansensitive.  Structural cardiology team was considering TAVR on him outpatient but per them, due to bacteremia, they will have to postpone this.  ID was consulted.  Patient was continued on Rocephin , plan was to do TEE on 12/13/2023 however due to reinitiation of the heparin  drip, this has now been postponed and is scheduled for 12/16/2023.  ESRD on HD/volume overload: Upon arrival to ED, patient received IV fluid bolus and was continued on slow IV fluids.  Typically receives dialysis on Monday Wednesday Friday schedule, due to volume overload, patient received dialysis on Tuesday and then on Wednesday to put him back on the schedule.  Nephrology managing.  History of paroxysmal atrial fibrillation: On Toprol -XL 12.5.  Rate still elevated.  Not on any anticoagulation previously.  Will defer to cardiology.  Chronic combined systolic and diastolic congestive heart failure: Volume management by dialysis.  Essential hypertension: Blood pressure remains elevated despite  of Toprol -XL.  Will start him on lisinopril  20 mg.  Hyperlipidemia: Continue statin.  QTc prolongation: Upon admission, QTc was 542.   Optimize electrolytes and await QTc prolonging medications and monitor on telemetry.  DVT prophylaxis: Heparin    Code Status: Full Code  Family Communication:  None present at bedside.  Plan of care discussed with patient in length and he/she verbalized understanding and agreed with it.  Status is: Inpatient Remains inpatient appropriate because: Scheduled for TEE Monday.  Estimated body mass index is 27.41 kg/m as calculated from the following:   Height as of this encounter: 5' 7 (1.702 m).   Weight as of this encounter: 79.4 kg.    Nutritional Assessment: Body mass index is 27.41 kg/m.SABRA Seen by dietician.  I agree with the assessment and plan as outlined below: Nutrition Status:        . Skin Assessment: I have examined the patient's skin and I agree with the wound assessment as performed by the wound care RN as outlined below:    Consultants:  Nephrology and cardiology ID Procedures:  As above  Antimicrobials:  Anti-infectives (From admission, onward)    Start     Dose/Rate Route Frequency Ordered Stop   12/10/23 2200  cefTRIAXone  (ROCEPHIN ) 2 g in sodium chloride  0.9 % 100 mL IVPB        2 g 200 mL/hr over 30 Minutes Intravenous Every 24 hours 12/10/23 1519     12/09/23 2200  vancomycin  (VANCOCIN ) IVPB 1000 mg/200 mL premix  Status:  Discontinued        1,000 mg 200 mL/hr over 60 Minutes Intravenous  Once 12/09/23 2151 12/09/23 2153   12/09/23 2200  ceFEPIme  (MAXIPIME ) 2 g in sodium chloride  0.9 % 100 mL IVPB        2 g 200 mL/hr over 30 Minutes Intravenous  Once 12/09/23 2151 12/09/23 2337   12/09/23 2200  metroNIDAZOLE  (FLAGYL ) IVPB 500 mg        500 mg 100 mL/hr over 60 Minutes Intravenous  Once 12/09/23 2151 12/09/23 2342   12/09/23 2200  vancomycin  (VANCOREADY) IVPB 1750 mg/350 mL        1,750 mg 175 mL/hr over 120 Minutes Intravenous  Once 12/09/23 2153 12/10/23 0142         Subjective: Seen and examined.  Still complains of very mild 1 out of 10  chest pain but no shortness of breath today.  No other complaint.  Objective: Vitals:   12/13/23 1615 12/13/23 1937 12/13/23 2340 12/14/23 0556  BP: (!) 158/73 (!) 140/86 (!) 160/82 (!) 165/76  Pulse: 87 82 80 78  Resp: 18 16 16 15   Temp: 98.6 F (37 C) 98.4 F (36.9 C) 98.3 F (36.8 C) 98.1 F (36.7 C)  TempSrc: Oral Oral Oral Oral  SpO2: 99% 100% 100% 97%  Weight:    79.4 kg  Height:        Intake/Output Summary (Last 24 hours) at 12/14/2023 0739 Last data filed at 12/13/2023 1852 Gross per 24 hour  Intake 1353.46 ml  Output 3000 ml  Net -1646.54 ml   Filed Weights   12/13/23 0746 12/13/23 1141 12/14/23 0556  Weight: 82.7 kg 84 kg 79.4 kg    Examination:  General exam: Appears calm and comfortable  Respiratory system: Clear to auscultation. Respiratory effort normal. Cardiovascular system: S1 & S2 heard, RRR. No JVD, murmurs, rubs, gallops or clicks. No pedal edema. Gastrointestinal system: Abdomen is nondistended, soft and nontender. No organomegaly or masses  felt. Normal bowel sounds heard. Central nervous system: Alert and oriented. No focal neurological deficits. Extremities: Symmetric 5 x 5 power. Skin: No rashes, lesions or ulcers.  Psychiatry: Judgement and insight appear normal. Mood & affect appropriate.   Data Reviewed: I have personally reviewed following labs and imaging studies  CBC: Recent Labs  Lab 12/10/23 1711 12/11/23 0352 12/11/23 1320 12/12/23 0415 12/13/23 0412 12/13/23 1445 12/14/23 0650  WBC 5.8 4.1 4.0 3.9* 3.5*  --  4.4  NEUTROABS 4.9 2.9  --   --   --   --   --   HGB 7.4* 7.4* 7.1* 7.2* 7.0* 8.6* 8.7*  HCT 23.1* 23.7* 22.0* 22.4* 21.7* 25.4* 26.5*  MCV 97.5 99.6 98.7 97.4 96.4  --  94.0  PLT 134* 134* 121* 131* 136*  --  159   Basic Metabolic Panel: Recent Labs  Lab 12/09/23 2131 12/09/23 2228 12/10/23 1004 12/11/23 0352 12/11/23 1320 12/13/23 0412  NA 135 136 136 134*  --  134*  K 3.4* 3.7 4.8 4.2  --  4.2  CL 92*  93* 93* 93*  --  94*  CO2 28  --  25 25  --  25  GLUCOSE 136* 113* 121* 149*  --  114*  BUN 13 15 20  27*  --  37*  CREATININE 5.37* 5.60* 6.47* 5.90* 7.00* 7.82*  CALCIUM  8.2*  --  8.3* 8.6*  --  9.1  MG  --   --  1.9  --   --   --   PHOS  --   --  4.4 4.6  --  6.7*   GFR: Estimated Creatinine Clearance: 10 mL/min (A) (by C-G formula based on SCr of 7.82 mg/dL (H)). Liver Function Tests: Recent Labs  Lab 12/10/23 1004 12/11/23 0352 12/13/23 0412  ALBUMIN  2.8* 2.6* 2.4*   No results for input(s): LIPASE, AMYLASE in the last 168 hours. No results for input(s): AMMONIA in the last 168 hours. Coagulation Profile: Recent Labs  Lab 12/09/23 2221  INR 1.1   Cardiac Enzymes: No results for input(s): CKTOTAL, CKMB, CKMBINDEX, TROPONINI in the last 168 hours. BNP (last 3 results) No results for input(s): PROBNP in the last 8760 hours. HbA1C: No results for input(s): HGBA1C in the last 72 hours. CBG: Recent Labs  Lab 12/10/23 0929 12/12/23 0827  GLUCAP 112* 141*   Lipid Profile: No results for input(s): CHOL, HDL, LDLCALC, TRIG, CHOLHDL, LDLDIRECT in the last 72 hours. Thyroid  Function Tests: No results for input(s): TSH, T4TOTAL, FREET4, T3FREE, THYROIDAB in the last 72 hours. Anemia Panel: No results for input(s): VITAMINB12, FOLATE, FERRITIN, TIBC, IRON, RETICCTPCT in the last 72 hours. Sepsis Labs: Recent Labs  Lab 12/09/23 2228  LATICACIDVEN 1.0    Recent Results (from the past 240 hours)  Resp panel by RT-PCR (RSV, Flu A&B, Covid) Anterior Nasal Swab     Status: None   Collection Time: 12/09/23 10:21 PM   Specimen: Anterior Nasal Swab  Result Value Ref Range Status   SARS Coronavirus 2 by RT PCR NEGATIVE NEGATIVE Final   Influenza A by PCR NEGATIVE NEGATIVE Final   Influenza B by PCR NEGATIVE NEGATIVE Final    Comment: (NOTE) The Xpert Xpress SARS-CoV-2/FLU/RSV plus assay is intended as an aid in the  diagnosis of influenza from Nasopharyngeal swab specimens and should not be used as a sole basis for treatment. Nasal washings and aspirates are unacceptable for Xpert Xpress SARS-CoV-2/FLU/RSV testing.  Fact Sheet for Patients: bloggercourse.com  Fact Sheet  for Healthcare Providers: seriousbroker.it  This test is not yet approved or cleared by the [place]  FDA and has been authorized for detection and/or diagnosis of SARS-CoV-2 by FDA under an Emergency Use Authorization (EUA). This EUA will remain in effect (meaning this test can be used) for the duration of the COVID-19 declaration under Section 564(b)(1) of the Act, 21 U.S.C. section 360bbb-3(b)(1), unless the authorization is terminated or revoked.     Resp Syncytial Virus by PCR NEGATIVE NEGATIVE Final    Comment: (NOTE) Fact Sheet for Patients: bloggercourse.com  Fact Sheet for Healthcare Providers: seriousbroker.it  This test is not yet approved or cleared by the [place]  FDA and has been authorized for detection and/or diagnosis of SARS-CoV-2 by FDA under an Emergency Use Authorization (EUA). This EUA will remain in effect (meaning this test can be used) for the duration of the COVID-19 declaration under Section 564(b)(1) of the Act, 21 U.S.C. section 360bbb-3(b)(1), unless the authorization is terminated or revoked.  Performed at [facility], [address] [place], [place] [address]   Blood Culture (routine x 2)     Status: Abnormal   Collection Time: 12/09/23 10:21 PM   Specimen: BLOOD RIGHT ARM  Result Value Ref Range Status   Specimen Description BLOOD RIGHT ARM  Final   Special Requests   Final    BOTTLES DRAWN AEROBIC AND ANAEROBIC Blood Culture adequate volume   Culture  Setup Time   Final    GRAM POSITIVE COCCI IN CHAINS IN BOTH AEROBIC AND ANAEROBIC BOTTLES CRITICAL RESULT CALLED TO,  READ BACK BY AND VERIFIED WITH: PHARMD J.FRENS AT 1400 ON 12/10/2023 BY T.SAAD.  Performed at [facility], [address] [place], [place] [address]    Culture STREPTOCOCCUS MITIS/ORALIS (A)  Final   Report Status 12/12/2023 FINAL  Final   Organism ID, Bacteria STREPTOCOCCUS MITIS/ORALIS  Final      Susceptibility   Streptococcus mitis/oralis - MIC*    PENICILLIN <=0.06 SENSITIVE Sensitive     CEFTRIAXONE  <=0.12 SENSITIVE Sensitive     LEVOFLOXACIN  1 SENSITIVE Sensitive     VANCOMYCIN  0.5 SENSITIVE Sensitive     * STREPTOCOCCUS MITIS/ORALIS  Blood Culture (routine x 2)     Status: Abnormal   Collection Time: 12/09/23 10:21 PM   Specimen: BLOOD RIGHT ARM  Result Value Ref Range Status   Specimen Description BLOOD RIGHT ARM  Final   Special Requests   Final    BOTTLES DRAWN AEROBIC AND ANAEROBIC Blood Culture adequate volume   Culture  Setup Time   Final    GRAM POSITIVE COCCI IN CHAINS IN BOTH AEROBIC AND ANAEROBIC BOTTLES CRITICAL VALUE NOTED.  VALUE IS CONSISTENT WITH PREVIOUSLY REPORTED AND CALLED VALUE.    Culture (A)  Final    STREPTOCOCCUS MITIS/ORALIS SUSCEPTIBILITIES PERFORMED ON PREVIOUS CULTURE WITHIN THE LAST 5 DAYS. Performed at [facility], [address] [place], [place] [address]    Report Status 12/12/2023 FINAL  Final  Blood Culture ID Panel (Reflexed)     Status: Abnormal   Collection Time: 12/09/23 10:21 PM  Result Value Ref Range Status   Enterococcus faecalis NOT DETECTED NOT DETECTED Final   Enterococcus Faecium NOT DETECTED NOT DETECTED Final   Listeria monocytogenes NOT DETECTED NOT DETECTED Final   Staphylococcus species NOT DETECTED NOT DETECTED Final   Staphylococcus aureus (BCID) NOT DETECTED NOT DETECTED Final   Staphylococcus epidermidis NOT DETECTED NOT DETECTED Final   Staphylococcus lugdunensis NOT DETECTED  NOT DETECTED Final   Streptococcus species DETECTED (A) NOT DETECTED Final    Comment: Not Enterococcus species,  Streptococcus agalactiae, Streptococcus pyogenes, or Streptococcus pneumoniae. CRITICAL RESULT CALLED TO, READ BACK BY AND VERIFIED WITH: PHARMD J.FRENS AT 1400 ON 12/10/2023 BY T.SAAD.     Streptococcus agalactiae NOT DETECTED NOT DETECTED Final   Streptococcus pneumoniae NOT DETECTED NOT DETECTED Final   Streptococcus pyogenes NOT DETECTED NOT DETECTED Final   A.calcoaceticus-baumannii NOT DETECTED NOT DETECTED Final   Bacteroides fragilis NOT DETECTED NOT DETECTED Final   Enterobacterales NOT DETECTED NOT DETECTED Final   Enterobacter cloacae complex NOT DETECTED NOT DETECTED Final   Escherichia coli NOT DETECTED NOT DETECTED Final   Klebsiella aerogenes NOT DETECTED NOT DETECTED Final   Klebsiella oxytoca NOT DETECTED NOT DETECTED Final   Klebsiella pneumoniae NOT DETECTED NOT DETECTED Final   Proteus species NOT DETECTED NOT DETECTED Final   Salmonella species NOT DETECTED NOT DETECTED Final   Serratia marcescens NOT DETECTED NOT DETECTED Final   Haemophilus influenzae NOT DETECTED NOT DETECTED Final   Neisseria meningitidis NOT DETECTED NOT DETECTED Final   Pseudomonas aeruginosa NOT DETECTED NOT DETECTED Final   Stenotrophomonas maltophilia NOT DETECTED NOT DETECTED Final   Candida albicans NOT DETECTED NOT DETECTED Final   Candida auris NOT DETECTED NOT DETECTED Final   Candida glabrata NOT DETECTED NOT DETECTED Final   Candida krusei NOT DETECTED NOT DETECTED Final   Candida parapsilosis NOT DETECTED NOT DETECTED Final   Candida tropicalis NOT DETECTED NOT DETECTED Final   Cryptococcus neoformans/gattii NOT DETECTED NOT DETECTED Final    Comment: Performed at [facility], [address], [place], [place] [address]  Culture, blood (Routine X 2) w Reflex to ID Panel     Status: None (Preliminary result)   Collection Time: 12/12/23 11:04 AM   Specimen: BLOOD RIGHT ARM  Result Value Ref Range Status   Specimen Description BLOOD RIGHT ARM  Final   Special Requests    Final    BOTTLES DRAWN AEROBIC AND ANAEROBIC Blood Culture adequate volume   Culture   Final    NO GROWTH < 24 HOURS Performed at [facility], [address], [place], [place] [address]    Report Status PENDING  Incomplete  Culture, blood (Routine X 2) w Reflex to ID Panel     Status: None (Preliminary result)   Collection Time: 12/12/23 11:04 AM   Specimen: BLOOD RIGHT ARM  Result Value Ref Range Status   Specimen Description BLOOD RIGHT ARM  Final   Special Requests   Final    BOTTLES DRAWN AEROBIC AND ANAEROBIC Blood Culture adequate volume   Culture   Final    NO GROWTH < 24 HOURS Performed at [facility], [address], [place], [place] [address]    Report Status PENDING  Incomplete     Radiology Studies: No results found.   Scheduled Meds:  aspirin  EC  81 mg Oral Daily   Chlorhexidine  Gluconate Cloth  6 each Topical Q0600   lidocaine   1 patch Transdermal Q24H   metoprolol  succinate  25 mg Oral Daily   pravastatin   40 mg Oral QHS   sevelamer  carbonate  4.8 g Oral TID WC   Continuous Infusions:  cefTRIAXone  (ROCEPHIN )  IV Stopped (12/13/23 2301)   heparin  1,450 Units/hr (12/14/23 9394)     LOS: 4 days   Fredia Skeeter, MD [facility]  12/14/2023, 7:39 AM   *Please note that this  is a verbal dictation therefore any spelling or grammatical errors are due to the Dragon Medical One system interpretation.  Please page via Amion and do not message via secure chat for urgent patient care matters. Secure chat can be used for non urgent patient care matters.  How to contact the [facility] Attending or Consulting provider 7A - 7P or covering provider during after hours 7P -7A, for this patient?  Check the care team in [facility] and look for a) attending/consulting [facility] provider listed and b) the [facility] team listed. Page or secure chat 7A-7P. Log into [email or web address] and use [facility] universal password to access. If you do not have the password, please contact the  hospital operator. Locate the [facility] provider you are looking for under [facility] and page to a number that you can be directly reached. If you still have difficulty reaching the provider, please page the [facility] (Director on Call) for the Hospitalists listed on amion for assistance.

## 2023-12-15 DIAGNOSIS — I214 Non-ST elevation (NSTEMI) myocardial infarction: Secondary | ICD-10-CM | POA: Diagnosis not present

## 2023-12-15 LAB — HEPARIN LEVEL (UNFRACTIONATED)
Heparin Unfractionated: 0.27 [IU]/mL — ABNORMAL LOW (ref 0.30–0.70)
Heparin Unfractionated: 0.27 [IU]/mL — ABNORMAL LOW (ref 0.30–0.70)
Heparin Unfractionated: 0.41 [IU]/mL (ref 0.30–0.70)

## 2023-12-15 LAB — RENAL FUNCTION PANEL
Albumin: 2.6 g/dL — ABNORMAL LOW (ref 3.5–5.0)
Anion gap: 17 — ABNORMAL HIGH (ref 5–15)
BUN: 37 mg/dL — ABNORMAL HIGH (ref 8–23)
CO2: 25 mmol/L (ref 22–32)
Calcium: 9.2 mg/dL (ref 8.9–10.3)
Chloride: 91 mmol/L — ABNORMAL LOW (ref 98–111)
Creatinine, Ser: 9.58 mg/dL — ABNORMAL HIGH (ref 0.61–1.24)
GFR, Estimated: 6 mL/min — ABNORMAL LOW (ref >60)
Glucose, Bld: 125 mg/dL — ABNORMAL HIGH (ref 70–99)
Phosphorus: 7.8 mg/dL — ABNORMAL HIGH (ref 2.5–4.6)
Potassium: 4.4 mmol/L (ref 3.5–5.1)
Sodium: 133 mmol/L — ABNORMAL LOW (ref 135–145)

## 2023-12-15 LAB — CBC
HCT: 25.8 % — ABNORMAL LOW (ref 39.0–52.0)
Hemoglobin: 8.5 g/dL — ABNORMAL LOW (ref 13.0–17.0)
MCH: 31 pg (ref 26.0–34.0)
MCHC: 32.9 g/dL (ref 30.0–36.0)
MCV: 94.2 fL (ref 80.0–100.0)
Platelets: 139 K/uL — ABNORMAL LOW (ref 150–400)
RBC: 2.74 MIL/uL — ABNORMAL LOW (ref 4.22–5.81)
RDW: 16.6 % — ABNORMAL HIGH (ref 11.5–15.5)
WBC: 3.9 K/uL — ABNORMAL LOW (ref 4.0–10.5)
nRBC: 0 % (ref 0.0–0.2)

## 2023-12-15 MED ORDER — ANTICOAGULANT SODIUM CITRATE 4% (200MG/5ML) IV SOLN: 5.0000 mL | Status: DC | PRN

## 2023-12-15 MED ORDER — HEPARIN SODIUM (PORCINE) 1000 UNIT/ML IJ SOLN
INTRAMUSCULAR | Status: AC
  Filled 2023-12-15: qty 3

## 2023-12-15 MED ORDER — PENTAFLUOROPROP-TETRAFLUOROETH EX AERO: 1.0000 | INHALATION_SPRAY | CUTANEOUS | Status: DC | PRN

## 2023-12-15 MED ORDER — SEVELAMER CARBONATE 800 MG PO TABS
4800.0000 mg | ORAL_TABLET | Freq: Three times a day (TID) | ORAL | Status: DC
  Administered 2023-12-15 – 2023-12-18 (×8): 4800 mg via ORAL
  Filled 2023-12-15 (×9): qty 6

## 2023-12-15 MED ORDER — LIDOCAINE HCL (PF) 1 % IJ SOLN: 5.0000 mL | INTRAMUSCULAR | Status: DC | PRN

## 2023-12-15 MED ORDER — ALTEPLASE 2 MG IJ SOLR: 2.0000 mg | Freq: Once | INTRAMUSCULAR | Status: DC | PRN

## 2023-12-15 MED ORDER — GUAIFENESIN-DM 100-10 MG/5ML PO SYRP
5.0000 mL | ORAL_SOLUTION | Freq: Once | ORAL | Status: AC
  Administered 2023-12-15: 5 mL via ORAL
  Filled 2023-12-15: qty 5

## 2023-12-15 MED ORDER — ALUM & MAG HYDROXIDE-SIMETH 200-200-20 MG/5ML PO SUSP
30.0000 mL | Freq: Once | ORAL | Status: AC
  Administered 2023-12-15: 30 mL via ORAL
  Filled 2023-12-15: qty 30

## 2023-12-15 MED ORDER — LIDOCAINE-PRILOCAINE 2.5-2.5 % EX CREA: 1.0000 | TOPICAL_CREAM | CUTANEOUS | Status: DC | PRN

## 2023-12-15 MED ORDER — HEPARIN SODIUM (PORCINE) 1000 UNIT/ML DIALYSIS: 1000.0000 [IU] | INTRAMUSCULAR | Status: DC | PRN

## 2023-12-15 MED ORDER — HEPARIN SODIUM (PORCINE) 1000 UNIT/ML DIALYSIS
2500.0000 [IU] | INTRAMUSCULAR | Status: DC | PRN
  Administered 2023-12-15: 2500 [IU] via INTRAVENOUS_CENTRAL

## 2023-12-15 MED ORDER — GUAIFENESIN ER 600 MG PO TB12
600.0000 mg | ORAL_TABLET | Freq: Once | ORAL | Status: AC
  Administered 2023-12-15: 600 mg via ORAL
  Filled 2023-12-15: qty 1

## 2023-12-15 NOTE — Progress Notes (Addendum)
  Progress Note  Patient Name: Kirk [name] Date of Encounter: 12/15/2023 [facility] Cardiologist: Vinie [name], MD   Interval Summary   See with wife at bedside as she was just discharged from 6E yesterday.  Vital Signs Vitals:   12/14/23 1931 12/15/23 0040 12/15/23 0548 12/15/23 0913  BP: (!) 153/79 (!) 141/65 (!) 156/77 (!) 150/78  Pulse: 74 69 78 76  Resp: 16 20 16  (!) 24  Temp: 97.8 F (36.6 C) 97.8 F (36.6 C) 98.4 F (36.9 C) 98.2 F (36.8 C)  TempSrc: Oral Oral Oral Oral  SpO2: 98% 99% 99% 100%  Weight:   80.8 kg   Height:        Intake/Output Summary (Last 24 hours) at 12/15/2023 1118 Last data filed at 12/15/2023 0400 Gross per 24 hour  Intake 913.82 ml  Output --  Net 913.82 ml      12/15/2023    5:48 AM 12/14/2023    5:56 AM 12/13/2023   11:41 AM  Last 3 Weights  Weight (lbs) 178 lb 3.2 oz 175 lb 185 lb 3 oz  Weight (kg) 80.831 kg 79.379 kg 84 kg      Telemetry/ECG  Sinus rhythm- Personally Reviewed  Physical Exam  GEN: Appears presyncopal, with angina Neck: No JVD Cardiac: RRR, 3/6 late peaking SEM Respiratory: Coarse bilateral breath sounds GI: Soft, nontender, non-distended  MS: No edema.  Warm on exam with 3/4 right lower extremity DP pulses  Assessment & Plan   NSTEMI LFLG AS - Patient recently had a coronary angiogram September 2025 demonstrating minimal CAD despite severely elevated coronary artery calcium  score.  Patient now in the hospital with strep mitis bacteremia anticipating TEE on Monday, receiving antibiotics.  He is ESRD on IHD, and initially had a troponin elevation 153 > 1098 > 5953>5620, now downtrending despite episode of chest burning yesterday (subsequently he tells me this did get better with tums). Consider starting pantoprazole .   -Echo grossly unchanged with ejection fraction 25 to 30% and grossly normal right ventricle.  Moderate low-flow low gradient AS by echo and aortic valve calcium  score on CT  is around 1200, which is concordant. - I am concerned that the patient is becoming increasingly symptomatic and when exerting himself is experiencing angina with complicating factors of low output and moderate AS.  Aortic valve intervention not planned currently due to bacteremia.  -Patient should be on relative bedrest given exertional angina and presyncope.   -With presyncope I am somewhat concerned about giving the patient nitroglycerin  for angina particularly with aortic valve stenosis that is at least moderate and may be hemodynamically significant given low flow low gradient.   - plan for cath Monday 11/24. Prior team conversations suggest cath was deferred due to bacteremia. I suspect this is no longer a contraindication with repeat Bcx NGTD x 2. Will have IC review, make NPO after midnight. - continue IV heparin .   ESRD on IHD - HD MWF for volume management.    For questions or updates, please contact [facility] Please consult [email or web address] for contact info under         Signed, Tomesha Sargent A Malvin Morrish, MD

## 2023-12-15 NOTE — Progress Notes (Signed)
 PROGRESS NOTE    Kirk [name]  FMW:[id number] DOB: [birth year] DOA: 12/09/2023 PCP: Leonarda Roxan [name], NP   Brief Narrative:  Kirk [name] is a 63 y.o. 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  He did complete 4 hours of hemodialysis.  His pain was, 10 out of 10 and associated with shortness of breath.  Worse when taking deep breath.  EMS was activated.   In the ER, he received full dose aspirin  324 mg x 1.  His chest pain persisted.  His twelve-lead EKG showed no evidence of acute ischemia.  High-sensitivity troponin 153 with delta, repeat 1098.  EDP discussed the case with cardiology recommended heparin  drip and admit for possible NSTEMI.    Assessment & Plan:   Principal Problem:   NSTEMI (non-ST elevated myocardial infarction) [facility]) Active Problems:   Chest pain   Streptococcal bacteremia  History of severe low-flow low gradient aortic stenosis/elevated troponin/?ACS-NSTEMI: chest pain initially thought to be due to hypotension during HD in the setting of low-flow low gradient severe aortic stenosis with a recent unremarkable left heart catheterization (10/04/23), Also, patient recently had CT coronary about 3 weeks ago which showed diffuse aortic atherosclerosis. and a history of chronic HD related chest pain (noted on patient's primary cardiologist notes). Echocardiogram showed unchanged left ventricular function however is significantly more dilated compared to prior. ECG with ST depressions, however similar ST depressions noted on prior ECGs. Also anemic. Patient received heparin  and nitroglycerin  drip for approximately 48 hours which were discontinued on 12/11/2023 per cardiology's recommendation, although his chest pain did not  improve but then it did improve with lidocaine  patch however his troponin did significantly rise and plateaued which is not consistent with his prior chronically elevated troponin and therefore cardiology now opined that he may have ACS and for that reason, he was restarted on heparin  drip on 12/13/2023 with a plan to continue for another 48 hours and TEE was rescheduled for Monday, 12/16/2023.  On 12/14/2023, patient was noted to have exertional dyspnea and presyncopal episode with minimal activity.  Troponins were once again checked and they are downtrending.  Cardiology is considering cardiac cath on Monday as well.  Cardiology recommends continuing heparin  until then.  Appreciate cardiology help.  Sepsis secondary to gram-positive bacteremia, not POA: Upon arrival, patient did not have any signs or symptoms of sepsis.  After admission, patient spiked fever with Tmax of 102.9 at around 4 PM on 12/10/2023 along with tachycardia and tachypnea and blood culture was positive with gram-positive cocci.  Patient started on Rocephin .  Last temperature spike of 100.9 at 7 PM 12/10/2023.  Blood culture now growing Streptococcus mitis/oralis which is pansensitive.  Structural cardiology team was considering TAVR on him outpatient but per them, due to bacteremia, they will have to postpone this.  ID was consulted.  Patient was continued on Rocephin , plan was to do TEE on 12/13/2023 however due to reinitiation of the heparin  drip, this has now been postponed and is scheduled for 12/16/2023.  ESRD on HD/volume overload: Upon arrival to ED, patient received IV fluid bolus and was continued on slow IV fluids.  Typically receives dialysis on Monday Wednesday Friday schedule, due to volume overload, patient received dialysis on Tuesday and then on Wednesday to put him back on the  schedule.  Nephrology managing.  History of paroxysmal atrial fibrillation: On Toprol -XL 12.5.  Rate still elevated.  Not on any anticoagulation  previously.  Will defer to cardiology.  Chronic combined systolic and diastolic congestive heart failure: Volume management by dialysis.  Essential hypertension: Blood pressure remains elevated despite of Toprol -XL.  Will start him on lisinopril  20 mg.  Hyperlipidemia: Continue statin.  QTc prolongation: Upon admission, QTc was 542.  Optimize electrolytes and await QTc prolonging medications and monitor on telemetry.  DVT prophylaxis: Heparin    Code Status: Full Code  Family Communication: Wife present at bedside.  Plan of care discussed with patient in length and he/she verbalized understanding and agreed with it.  Status is: Inpatient Remains inpatient appropriate because: Scheduled for TEE and possibly cardiac cath Monday.  Estimated body mass index is 27.91 kg/m as calculated from the following:   Height as of this encounter: 5' 7 (1.702 m).   Weight as of this encounter: 80.8 kg.    Nutritional Assessment: Body mass index is 27.91 kg/m.SABRA Seen by dietician.  I agree with the assessment and plan as outlined below: Nutrition Status:        . Skin Assessment: I have examined the patient's skin and I agree with the wound assessment as performed by the wound care RN as outlined below:    Consultants:  Nephrology and cardiology ID Procedures:  As above  Antimicrobials:  Anti-infectives (From admission, onward)    Start     Dose/Rate Route Frequency Ordered Stop   12/10/23 2200  cefTRIAXone  (ROCEPHIN ) 2 g in sodium chloride  0.9 % 100 mL IVPB        2 g 200 mL/hr over 30 Minutes Intravenous Every 24 hours 12/10/23 1519     12/09/23 2200  vancomycin  (VANCOCIN ) IVPB 1000 mg/200 mL premix  Status:  Discontinued        1,000 mg 200 mL/hr over 60 Minutes Intravenous  Once 12/09/23 2151 12/09/23 2153   12/09/23 2200  ceFEPIme  (MAXIPIME ) 2 g in sodium chloride  0.9 % 100 mL IVPB        2 g 200 mL/hr over 30 Minutes Intravenous  Once 12/09/23 2151 12/09/23 2337   12/09/23  2200  metroNIDAZOLE  (FLAGYL ) IVPB 500 mg        500 mg 100 mL/hr over 60 Minutes Intravenous  Once 12/09/23 2151 12/09/23 2342   12/09/23 2200  vancomycin  (VANCOREADY) IVPB 1750 mg/350 mL        1,750 mg 175 mL/hr over 120 Minutes Intravenous  Once 12/09/23 2153 12/10/23 0142         Subjective: Seen and examined.  Still complains of chest pain but very mild with no shortness of breath.  Continues to appear comfortable in bed.  Wife at the bedside.  Objective: Vitals:   12/14/23 1613 12/14/23 1931 12/15/23 0040 12/15/23 0548  BP: (!) 160/77 (!) 153/79 (!) 141/65 (!) 156/77  Pulse: 75 74 69 78  Resp: 17 16 20 16   Temp: 97.9 F (36.6 C) 97.8 F (36.6 C) 97.8 F (36.6 C) 98.4 F (36.9 C)  TempSrc: Oral Oral Oral Oral  SpO2: 100% 98% 99% 99%  Weight:    80.8 kg  Height:        Intake/Output Summary (Last 24 hours) at 12/15/2023 0756 Last data filed at 12/15/2023 0400 Gross per 24 hour  Intake 1093.82 ml  Output --  Net 1093.82 ml   Filed Weights   12/13/23 1141 12/14/23 0556 12/15/23 0548  Weight:  84 kg 79.4 kg 80.8 kg    Examination:  General exam: Appears calm and comfortable  Respiratory system: Clear to auscultation. Respiratory effort normal. Cardiovascular system: S1 & S2 heard, RRR. No JVD, murmurs, rubs, gallops or clicks. No pedal edema. Gastrointestinal system: Abdomen is nondistended, soft and nontender. No organomegaly or masses felt. Normal bowel sounds heard. Central nervous system: Alert and oriented. No focal neurological deficits. Extremities: Symmetric 5 x 5 power. Skin: No rashes, lesions or ulcers.  Psychiatry: Judgement and insight appear normal. Mood & affect appropriate.   Data Reviewed: I have personally reviewed following labs and imaging studies  CBC: Recent Labs  Lab 12/10/23 1711 12/11/23 0352 12/11/23 1320 12/12/23 0415 12/13/23 0412 12/13/23 1445 12/14/23 0650 12/15/23 0333  WBC 5.8 4.1 4.0 3.9* 3.5*  --  4.4 3.9*   NEUTROABS 4.9 2.9  --   --   --   --   --   --   HGB 7.4* 7.4* 7.1* 7.2* 7.0* 8.6* 8.7* 8.5*  HCT 23.1* 23.7* 22.0* 22.4* 21.7* 25.4* 26.5* 25.8*  MCV 97.5 99.6 98.7 97.4 96.4  --  94.0 94.2  PLT 134* 134* 121* 131* 136*  --  159 139*   Basic Metabolic Panel: Recent Labs  Lab 12/09/23 2131 12/09/23 2228 12/10/23 1004 12/11/23 0352 12/11/23 1320 12/13/23 0412  NA 135 136 136 134*  --  134*  K 3.4* 3.7 4.8 4.2  --  4.2  CL 92* 93* 93* 93*  --  94*  CO2 28  --  25 25  --  25  GLUCOSE 136* 113* 121* 149*  --  114*  BUN 13 15 20  27*  --  37*  CREATININE 5.37* 5.60* 6.47* 5.90* 7.00* 7.82*  CALCIUM  8.2*  --  8.3* 8.6*  --  9.1  MG  --   --  1.9  --   --   --   PHOS  --   --  4.4 4.6  --  6.7*   GFR: Estimated Creatinine Clearance: 10.1 mL/min (A) (by C-G formula based on SCr of 7.82 mg/dL (H)). Liver Function Tests: Recent Labs  Lab 12/10/23 1004 12/11/23 0352 12/13/23 0412  ALBUMIN  2.8* 2.6* 2.4*   No results for input(s): LIPASE, AMYLASE in the last 168 hours. No results for input(s): AMMONIA in the last 168 hours. Coagulation Profile: Recent Labs  Lab 12/09/23 2221  INR 1.1   Cardiac Enzymes: No results for input(s): CKTOTAL, CKMB, CKMBINDEX, TROPONINI in the last 168 hours. BNP (last 3 results) No results for input(s): PROBNP in the last 8760 hours. HbA1C: No results for input(s): HGBA1C in the last 72 hours. CBG: Recent Labs  Lab 12/10/23 0929 12/12/23 0827 12/14/23 1236  GLUCAP 112* 141* 130*   Lipid Profile: No results for input(s): CHOL, HDL, LDLCALC, TRIG, CHOLHDL, LDLDIRECT in the last 72 hours. Thyroid  Function Tests: No results for input(s): TSH, T4TOTAL, FREET4, T3FREE, THYROIDAB in the last 72 hours. Anemia Panel: No results for input(s): VITAMINB12, FOLATE, FERRITIN, TIBC, IRON, RETICCTPCT in the last 72 hours. Sepsis Labs: Recent Labs  Lab 12/09/23 2228  LATICACIDVEN 1.0    Recent  Results (from the past 240 hours)  Resp panel by RT-PCR (RSV, Flu A&B, Covid) Anterior Nasal Swab     Status: None   Collection Time: 12/09/23 10:21 PM   Specimen: Anterior Nasal Swab  Result Value Ref Range Status   SARS Coronavirus 2 by RT PCR NEGATIVE NEGATIVE Final   Influenza A by PCR  NEGATIVE NEGATIVE Final   Influenza B by PCR NEGATIVE NEGATIVE Final    Comment: (NOTE) The Xpert Xpress SARS-CoV-2/FLU/RSV plus assay is intended as an aid in the diagnosis of influenza from Nasopharyngeal swab specimens and should not be used as a sole basis for treatment. Nasal washings and aspirates are unacceptable for Xpert Xpress SARS-CoV-2/FLU/RSV testing.  Fact Sheet for Patients: bloggercourse.com  Fact Sheet for Healthcare Providers: seriousbroker.it  This test is not yet approved or cleared by the [place]  FDA and has been authorized for detection and/or diagnosis of SARS-CoV-2 by FDA under an Emergency Use Authorization (EUA). This EUA will remain in effect (meaning this test can be used) for the duration of the COVID-19 declaration under Section 564(b)(1) of the Act, 21 U.S.C. section 360bbb-3(b)(1), unless the authorization is terminated or revoked.     Resp Syncytial Virus by PCR NEGATIVE NEGATIVE Final    Comment: (NOTE) Fact Sheet for Patients: bloggercourse.com  Fact Sheet for Healthcare Providers: seriousbroker.it  This test is not yet approved or cleared by the [place]  FDA and has been authorized for detection and/or diagnosis of SARS-CoV-2 by FDA under an Emergency Use Authorization (EUA). This EUA will remain in effect (meaning this test can be used) for the duration of the COVID-19 declaration under Section 564(b)(1) of the Act, 21 U.S.C. section 360bbb-3(b)(1), unless the authorization is terminated or revoked.  Performed at [facility], [address] [place], [place] [address]   Blood Culture (routine x 2)     Status: Abnormal   Collection Time: 12/09/23 10:21 PM   Specimen: BLOOD RIGHT ARM  Result Value Ref Range Status   Specimen Description BLOOD RIGHT ARM  Final   Special Requests   Final    BOTTLES DRAWN AEROBIC AND ANAEROBIC Blood Culture adequate volume   Culture  Setup Time   Final    GRAM POSITIVE COCCI IN CHAINS IN BOTH AEROBIC AND ANAEROBIC BOTTLES CRITICAL RESULT CALLED TO, READ BACK BY AND VERIFIED WITH: PHARMD J.FRENS AT 1400 ON 12/10/2023 BY T.SAAD.  Performed at [facility], [address], [place], [place] [address]    Culture STREPTOCOCCUS MITIS/ORALIS (A)  Final   Report Status 12/12/2023 FINAL  Final   Organism ID, Bacteria STREPTOCOCCUS MITIS/ORALIS  Final      Susceptibility   Streptococcus mitis/oralis - MIC*    PENICILLIN <=0.06 SENSITIVE Sensitive     CEFTRIAXONE  <=0.12 SENSITIVE Sensitive     LEVOFLOXACIN  1 SENSITIVE Sensitive     VANCOMYCIN  0.5 SENSITIVE Sensitive     * STREPTOCOCCUS MITIS/ORALIS  Blood Culture (routine x 2)     Status: Abnormal   Collection Time: 12/09/23 10:21 PM   Specimen: BLOOD RIGHT ARM  Result Value Ref Range Status   Specimen Description BLOOD RIGHT ARM  Final   Special Requests   Final    BOTTLES DRAWN AEROBIC AND ANAEROBIC Blood Culture adequate volume   Culture  Setup Time   Final    GRAM POSITIVE COCCI IN CHAINS IN BOTH AEROBIC AND ANAEROBIC BOTTLES CRITICAL VALUE NOTED.  VALUE IS CONSISTENT WITH PREVIOUSLY REPORTED AND CALLED VALUE.    Culture (A)  Final    STREPTOCOCCUS MITIS/ORALIS SUSCEPTIBILITIES PERFORMED ON PREVIOUS CULTURE WITHIN THE LAST 5 DAYS. Performed at [facility], [address], [place], [place] [address]    Report Status 12/12/2023 FINAL  Final  Blood Culture ID Panel (Reflexed)     Status: Abnormal   Collection Time: 12/09/23 10:21  PM  Result Value Ref Range Status   Enterococcus faecalis NOT DETECTED NOT DETECTED Final    Enterococcus Faecium NOT DETECTED NOT DETECTED Final   Listeria monocytogenes NOT DETECTED NOT DETECTED Final   Staphylococcus species NOT DETECTED NOT DETECTED Final   Staphylococcus aureus (BCID) NOT DETECTED NOT DETECTED Final   Staphylococcus epidermidis NOT DETECTED NOT DETECTED Final   Staphylococcus lugdunensis NOT DETECTED NOT DETECTED Final   Streptococcus species DETECTED (A) NOT DETECTED Final    Comment: Not Enterococcus species, Streptococcus agalactiae, Streptococcus pyogenes, or Streptococcus pneumoniae. CRITICAL RESULT CALLED TO, READ BACK BY AND VERIFIED WITH: PHARMD J.FRENS AT 1400 ON 12/10/2023 BY T.SAAD.     Streptococcus agalactiae NOT DETECTED NOT DETECTED Final   Streptococcus pneumoniae NOT DETECTED NOT DETECTED Final   Streptococcus pyogenes NOT DETECTED NOT DETECTED Final   A.calcoaceticus-baumannii NOT DETECTED NOT DETECTED Final   Bacteroides fragilis NOT DETECTED NOT DETECTED Final   Enterobacterales NOT DETECTED NOT DETECTED Final   Enterobacter cloacae complex NOT DETECTED NOT DETECTED Final   Escherichia coli NOT DETECTED NOT DETECTED Final   Klebsiella aerogenes NOT DETECTED NOT DETECTED Final   Klebsiella oxytoca NOT DETECTED NOT DETECTED Final   Klebsiella pneumoniae NOT DETECTED NOT DETECTED Final   Proteus species NOT DETECTED NOT DETECTED Final   Salmonella species NOT DETECTED NOT DETECTED Final   Serratia marcescens NOT DETECTED NOT DETECTED Final   Haemophilus influenzae NOT DETECTED NOT DETECTED Final   Neisseria meningitidis NOT DETECTED NOT DETECTED Final   Pseudomonas aeruginosa NOT DETECTED NOT DETECTED Final   Stenotrophomonas maltophilia NOT DETECTED NOT DETECTED Final   Candida albicans NOT DETECTED NOT DETECTED Final   Candida auris NOT DETECTED NOT DETECTED Final   Candida glabrata NOT DETECTED NOT DETECTED Final   Candida krusei NOT DETECTED NOT DETECTED Final   Candida parapsilosis NOT DETECTED NOT DETECTED Final   Candida  tropicalis NOT DETECTED NOT DETECTED Final   Cryptococcus neoformans/gattii NOT DETECTED NOT DETECTED Final    Comment: Performed at [facility], [address], [place], [place] [address]  Culture, blood (Routine X 2) w Reflex to ID Panel     Status: None (Preliminary result)   Collection Time: 12/12/23 11:04 AM   Specimen: BLOOD RIGHT ARM  Result Value Ref Range Status   Specimen Description BLOOD RIGHT ARM  Final   Special Requests   Final    BOTTLES DRAWN AEROBIC AND ANAEROBIC Blood Culture adequate volume   Culture   Final    NO GROWTH 2 DAYS Performed at [facility], [address] [place], [place] [address]    Report Status PENDING  Incomplete  Culture, blood (Routine X 2) w Reflex to ID Panel     Status: None (Preliminary result)   Collection Time: 12/12/23 11:04 AM   Specimen: BLOOD RIGHT ARM  Result Value Ref Range Status   Specimen Description BLOOD RIGHT ARM  Final   Special Requests   Final    BOTTLES DRAWN AEROBIC AND ANAEROBIC Blood Culture adequate volume   Culture   Final    NO GROWTH 2 DAYS Performed at [facility], [address], [place], [place] [address]    Report Status PENDING  Incomplete     Radiology Studies: No results found.   Scheduled Meds:  aspirin  EC  81 mg Oral Daily   Chlorhexidine  Gluconate Cloth  6 each Topical Q0600   [START ON 12/18/2023] darbepoetin (ARANESP ) injection - DIALYSIS  100  mcg Subcutaneous Q Wed-1800   lidocaine   1 patch Transdermal Q24H   lisinopril   20 mg Oral Daily   metoprolol  succinate  25 mg Oral Daily   pravastatin   40 mg Oral QHS   sevelamer  carbonate  4.8 g Oral TID WC   Continuous Infusions:  cefTRIAXone  (ROCEPHIN )  IV Stopped (12/14/23 2309)   heparin  1,700 Units/hr (12/14/23 2015)     LOS: 5 days   Fredia Skeeter, MD [facility]  12/15/2023, 7:56 AM   *Please note that this is a verbal dictation therefore any spelling or grammatical errors are due to the Dragon Medical  One system interpretation.  Please page via Amion and do not message via secure chat for urgent patient care matters. Secure chat can be used for non urgent patient care matters.  How to contact the [facility] Attending or Consulting provider 7A - 7P or covering provider during after hours 7P -7A, for this patient?  Check the care team in [facility] and look for a) attending/consulting [facility] provider listed and b) the [facility] team listed. Page or secure chat 7A-7P. Log into [email or web address] and use [place]'s universal password to access. If you do not have the password, please contact the hospital operator. Locate the [facility] provider you are looking for under [facility] and page to a number that you can be directly reached. If you still have difficulty reaching the provider, please page the [facility] (Director on Call) for the Hospitalists listed on amion for assistance.

## 2023-12-15 NOTE — Progress Notes (Signed)
   12/15/23 2320  BiPAP/CPAP/SIPAP  BiPAP/CPAP/SIPAP Pt Type Adult  BiPAP/CPAP/SIPAP Resmed  Mask Type Full face mask  Mask Size Medium  Respiratory Rate 18 breaths/min  IPAP 16 cmH20  EPAP 6 cmH2O  FiO2 (%) 21 %  Patient Home Machine No  Patient Home Mask No  Patient Home Tubing No  Auto Titrate Yes  Minimum cmH2O 6 cmH2O  Maximum cmH2O 16 cmH2O  Device Plugged into RED Power Outlet Yes  BiPAP/CPAP /SiPAP Vitals  Resp 20

## 2023-12-15 NOTE — H&P (View-Only) (Signed)
  Progress Note  Patient Name: Kirk [name] Date of Encounter: 12/15/2023 [facility] Cardiologist: Vinie [name], MD   Interval Summary   See with wife at bedside as she was just discharged from 6E yesterday.  Vital Signs Vitals:   12/14/23 1931 12/15/23 0040 12/15/23 0548 12/15/23 0913  BP: (!) 153/79 (!) 141/65 (!) 156/77 (!) 150/78  Pulse: 74 69 78 76  Resp: 16 20 16  (!) 24  Temp: 97.8 F (36.6 C) 97.8 F (36.6 C) 98.4 F (36.9 C) 98.2 F (36.8 C)  TempSrc: Oral Oral Oral Oral  SpO2: 98% 99% 99% 100%  Weight:   80.8 kg   Height:        Intake/Output Summary (Last 24 hours) at 12/15/2023 1118 Last data filed at 12/15/2023 0400 Gross per 24 hour  Intake 913.82 ml  Output --  Net 913.82 ml      12/15/2023    5:48 AM 12/14/2023    5:56 AM 12/13/2023   11:41 AM  Last 3 Weights  Weight (lbs) 178 lb 3.2 oz 175 lb 185 lb 3 oz  Weight (kg) 80.831 kg 79.379 kg 84 kg      Telemetry/ECG  Sinus rhythm- Personally Reviewed  Physical Exam  GEN: Appears presyncopal, with angina Neck: No JVD Cardiac: RRR, 3/6 late peaking SEM Respiratory: Coarse bilateral breath sounds GI: Soft, nontender, non-distended  MS: No edema.  Warm on exam with 3/4 right lower extremity DP pulses  Assessment & Plan   NSTEMI LFLG AS - Patient recently had a coronary angiogram September 2025 demonstrating minimal CAD despite severely elevated coronary artery calcium  score.  Patient now in the hospital with strep mitis bacteremia anticipating TEE on Monday, receiving antibiotics.  He is ESRD on IHD, and initially had a troponin elevation 153 > 1098 > 5953>5620, now downtrending despite episode of chest burning yesterday (subsequently he tells me this did get better with tums). Consider starting pantoprazole .   -Echo grossly unchanged with ejection fraction 25 to 30% and grossly normal right ventricle.  Moderate low-flow low gradient AS by echo and aortic valve calcium  score on CT  is around 1200, which is concordant. - I am concerned that the patient is becoming increasingly symptomatic and when exerting himself is experiencing angina with complicating factors of low output and moderate AS.  Aortic valve intervention not planned currently due to bacteremia.  -Patient should be on relative bedrest given exertional angina and presyncope.   -With presyncope I am somewhat concerned about giving the patient nitroglycerin  for angina particularly with aortic valve stenosis that is at least moderate and may be hemodynamically significant given low flow low gradient.   - plan for cath Monday 11/24. Prior team conversations suggest cath was deferred due to bacteremia. I suspect this is no longer a contraindication with repeat Bcx NGTD x 2. Will have IC review, make NPO after midnight. - continue IV heparin .   ESRD on IHD - HD MWF for volume management.    For questions or updates, please contact [facility] Please consult [email or web address] for contact info under         Signed, Tomesha Sargent A Malvin Morrish, MD

## 2023-12-15 NOTE — Progress Notes (Signed)
 [facility] Progress Note   Subjective:   Patient seen and examined at bedside.  Wife, who is also admitted on this floor, is in bedside chair.  Reports feeling better this AM.  No dizziness when ambulating to bathroom earlier.  Denies chest pain and shortness of breath.   Objective Vitals:   12/14/23 1931 12/15/23 0040 12/15/23 0548 12/15/23 0913  BP: (!) 153/79 (!) 141/65 (!) 156/77 (!) 150/78  Pulse: 74 69 78 76  Resp: 16 20 16  (!) 24  Temp: 97.8 F (36.6 C) 97.8 F (36.6 C) 98.4 F (36.9 C) 98.2 F (36.8 C)  TempSrc: Oral Oral Oral Oral  SpO2: 98% 99% 99% 100%  Weight:   80.8 kg   Height:       Physical Exam General:chronically ill appearing male in NAD Heart:RRR Lungs:CTAB, nml WOB on 2L O2 Abdomen:soft, NTND Extremities:no LE edema Dialysis Access: LU AVF   Filed Weights   12/13/23 1141 12/14/23 0556 12/15/23 0548  Weight: 84 kg 79.4 kg 80.8 kg    Intake/Output Summary (Last 24 hours) at 12/15/2023 0926 Last data filed at 12/15/2023 0400 Gross per 24 hour  Intake 1093.82 ml  Output --  Net 1093.82 ml    Additional Objective Labs: Basic Metabolic Panel: Recent Labs  Lab 12/10/23 1004 12/11/23 0352 12/11/23 1320 12/13/23 0412  NA 136 134*  --  134*  K 4.8 4.2  --  4.2  CL 93* 93*  --  94*  CO2 25 25  --  25  GLUCOSE 121* 149*  --  114*  BUN 20 27*  --  37*  CREATININE 6.47* 5.90* 7.00* 7.82*  CALCIUM  8.3* 8.6*  --  9.1  PHOS 4.4 4.6  --  6.7*   Liver Function Tests: Recent Labs  Lab 12/10/23 1004 12/11/23 0352 12/13/23 0412  ALBUMIN  2.8* 2.6* 2.4*   CBC: Recent Labs  Lab 12/10/23 1711 12/11/23 0352 12/11/23 1320 12/12/23 0415 12/13/23 0412 12/13/23 1445 12/14/23 0650 12/15/23 0333  WBC 5.8 4.1 4.0 3.9* 3.5*  --  4.4 3.9*  NEUTROABS 4.9 2.9  --   --   --   --   --   --   HGB 7.4* 7.4* 7.1* 7.2* 7.0* 8.6* 8.7* 8.5*  HCT 23.1* 23.7* 22.0* 22.4* 21.7* 25.4* 26.5* 25.8*  MCV 97.5 99.6 98.7 97.4 96.4  --  94.0 94.2   PLT 134* 134* 121* 131* 136*  --  159 139*   Blood Culture    Component Value Date/Time   SDES BLOOD RIGHT ARM 12/12/2023 1104   SDES BLOOD RIGHT ARM 12/12/2023 1104   SPECREQUEST  12/12/2023 1104    BOTTLES DRAWN AEROBIC AND ANAEROBIC Blood Culture adequate volume   SPECREQUEST  12/12/2023 1104    BOTTLES DRAWN AEROBIC AND ANAEROBIC Blood Culture adequate volume   CULT  12/12/2023 1104    NO GROWTH 2 DAYS Performed at [facility], [address] [place], [place] [address]    CULT  12/12/2023 1104    NO GROWTH 2 DAYS Performed at [facility], [address], [place], [place] [address]    REPTSTATUS PENDING 12/12/2023 1104   REPTSTATUS PENDING 12/12/2023 1104    CBG: Recent Labs  Lab 12/10/23 0929 12/12/23 0827 12/14/23 1236  GLUCAP 112* 141* 130*   No results found.  Medications:  cefTRIAXone  (ROCEPHIN )  IV Stopped (12/14/23 2309)   heparin  1,700 Units/hr (12/14/23 2015)    aspirin  EC  81 mg Oral  Daily   Chlorhexidine  Gluconate Cloth  6 each Topical Q0600   [START ON 12/18/2023] darbepoetin (ARANESP ) injection - DIALYSIS  100 mcg Subcutaneous Q Wed-1800   lidocaine   1 patch Transdermal Q24H   lisinopril   20 mg Oral Daily   metoprolol  succinate  25 mg Oral Daily   pravastatin   40 mg Oral QHS   sevelamer  carbonate  4,800 mg Oral TID WC    Dialysis Orders: [facility] MWF  4h   B400   81.3kg   LFA AVF   Heparin  2500 Last OP HD 11/17, post wt 82kg Mircera 75 mcg q 4wks, last 11/12, due 12/10   Assessment/Plan: NSTEMI/Elevated troponin. IV Heparin /nitroglycerin   -  troponin trending up. Cardiology following closely. May have coronary angiography Monday if ongoing symptoms.  HFrEF 25-30%.  Volume overload on arrival. Optimize volume with HD.  Had extra HD Tues. Now below EDW. UF as tolerated. Strep mitis bacteremia. Antibiotics per primary. ID following.  For TEE  tomorrow. ESRD: on HD MWF. Due to Thanksgiving Holiday will have dialysis on Sunday,  Tuesday and Friday this week.  HD today.  HTN. BP elevated this AM. On metoprolol .  May improve with dialysis.  Anemia. Hgb 8.5, s/p 1 unit pRBC Transfuse prbcs today.  ESA recently dosed. Will increase frequency to q 2 wks - next ESA due 11/26.  2HPTH. Ca/Phos ok. Continue home meds. Asked to switch to Renvela  pills to avoid increased fluids with taking powder. Equivalent dose - 6 pills TID.  Aortic stenosis- TCTS consulted. Planning for outpatient TAVR which is now on hold d/t bacteremia. Nutrition - Renal diet w/fluid restrictions.  Manuelita Labella, PA-C [facility] 12/15/2023,9:26 AM  LOS: 5 days

## 2023-12-15 NOTE — Progress Notes (Signed)
 PHARMACY - ANTICOAGULATION CONSULT NOTE  Pharmacy Consult for heparin  Indication: chest pain/ACS  Allergies  Allergen Reactions   Codeine Nausea And Vomiting   Lortab [Hydrocodone -Acetaminophen ] Nausea And Vomiting    Patient Measurements: Height: 5' 7 (170.2 cm) Weight: 80.8 kg (178 lb 3.2 oz) IBW/kg (Calculated) : 66.1 HEPARIN  DW (KG): 82  Vital Signs: Temp: 98.4 F (36.9 C) (11/23 0548) Temp Source: Oral (11/23 0548) BP: 156/77 (11/23 0548) Pulse Rate: 78 (11/23 0548)  Labs: Recent Labs    12/13/23 0412 12/13/23 1445 12/13/23 2131 12/14/23 0650 12/14/23 1257 12/14/23 1457 12/14/23 2004 12/15/23 0333  HGB 7.0* 8.6*  --  8.7*  --   --   --  8.5*  HCT 21.7* 25.4*  --  26.5*  --   --   --  25.8*  PLT 136*  --   --  159  --   --   --  139*  HEPARINUNFRC  --   --    < > 0.14*  --   --  0.72* 0.27*  CREATININE 7.82*  --   --   --   --   --   --   --   TROPONINIHS  --   --   --   --  1,107* 971*  --   --    < > = values in this interval not displayed.    Estimated Creatinine Clearance: 10.1 mL/min (A) (by C-G formula based on SCr of 7.82 mg/dL (H)).   Assessment: 63 y/o M initially presented to the ED with chest pain. Has ESRD on HD. Was started on heparin  and then stopped until this morning.  Per Cardiology >ECG with ST depressions (he has had ST depressions on prior ECGs). However, troponins did significantly rise and plateau in a pattern concerning for an ACS event and inconsistent with his prior chronically elevated troponin. Pharmacy consulted to manage heparin  infusion.  Heparin  level subtherapeutic at 0.27 on 1700 units/hr. Unable to explain the drop in his level. Confirmed no interruptions to the infusion with RN. Verified the drip in the patient's room as well. Will increase and repeat. CBC stable. No signs/symptoms of bleeding noted.    Goal of Therapy:  Heparin  level 0.3-0.7 units/ml Monitor platelets by anticoagulation protocol: Yes   Plan:   -increase heparin  1850 units/hr -heparin  level in 8h -heparin  level and CBC daily   Elma Fail, PharmD PGY1 Clinical Pharmacist [facility]  12/15/2023 8:33 AM

## 2023-12-15 NOTE — Progress Notes (Signed)
 PHARMACY - ANTICOAGULATION CONSULT NOTE  Pharmacy Consult for heparin  Indication: chest pain/ACS  Allergies  Allergen Reactions   Codeine Nausea And Vomiting   Lortab [Hydrocodone -Acetaminophen ] Nausea And Vomiting    Patient Measurements: Height: 5' 7 (170.2 cm) Weight: 80 kg (176 lb 5.9 oz) (standing scale.) IBW/kg (Calculated) : 66.1 HEPARIN  DW (KG): 82  Vital Signs: Temp: 98.7 F (37.1 C) (11/23 1704) Temp Source: Oral (11/23 1704) BP: 184/71 (11/23 1704) Pulse Rate: 86 (11/23 1827)  Labs: Recent Labs    12/13/23 0412 12/13/23 1445 12/13/23 2131 12/14/23 0650 12/14/23 1257 12/14/23 1457 12/14/23 2004 12/15/23 0333 12/15/23 0920 12/15/23 1300 12/15/23 1921  HGB 7.0* 8.6*  --  8.7*  --   --   --  8.5*  --   --   --   HCT 21.7* 25.4*  --  26.5*  --   --   --  25.8*  --   --   --   PLT 136*  --   --  159  --   --   --  139*  --   --   --   HEPARINUNFRC  --   --    < > 0.14*  --   --    < > 0.27* 0.27*  --  0.41  CREATININE 7.82*  --   --   --   --   --   --   --   --  9.58*  --   TROPONINIHS  --   --   --   --  1,107* 971*  --   --   --   --   --    < > = values in this interval not displayed.    Estimated Creatinine Clearance: 8.2 mL/min (A) (by C-G formula based on SCr of 9.58 mg/dL (H)).   Assessment: 63 y/o M initially presented to the ED with chest pain. Has ESRD on HD. Was started on heparin  and then stopped until this morning.  Per Cardiology >ECG with ST depressions (he has had ST depressions on prior ECGs). However, troponins did significantly rise and plateau in a pattern concerning for an ACS event and inconsistent with his prior chronically elevated troponin. Pharmacy consulted to manage heparin  infusion.  Heparin  level subtherapeutic at 0.27 on 1700 units/hr. Unable to explain the drop in his level. Confirmed no interruptions to the infusion with RN. Verified the drip in the patient's room as well. Will increase and repeat. CBC stable. No  signs/symptoms of bleeding noted.   PM update: HL 0.41 is therapeutic on 1850 units/hr. No issues with the infusion or bleeding reported.   Goal of Therapy:  Heparin  level 0.3-0.7 units/ml Monitor platelets by anticoagulation protocol: Yes   Plan:  -continue heparin  1850 units/hr -confirmatory heparin  level in 8h -heparin  level and CBC daily   Rocky Slade, PharmD, BCPS 12/15/2023 7:48 PM  Please check AMION for all [facility] Pharmacy phone numbers After 10:00 PM, call Main Pharmacy [phone]

## 2023-12-16 ENCOUNTER — Inpatient Hospital Stay (HOSPITAL_COMMUNITY): Payer: Self-pay | Admitting: Anesthesiology

## 2023-12-16 ENCOUNTER — Encounter (HOSPITAL_COMMUNITY)
Admission: EM | Disposition: A | Discharge status: Home / Self Care | Payer: Self-pay | Source: Home / Self Care | Attending: Family Medicine

## 2023-12-16 ENCOUNTER — Encounter (HOSPITAL_COMMUNITY): Payer: Self-pay

## 2023-12-16 ENCOUNTER — Inpatient Hospital Stay (HOSPITAL_COMMUNITY)

## 2023-12-16 DIAGNOSIS — R7881 Bacteremia: Secondary | ICD-10-CM

## 2023-12-16 DIAGNOSIS — I350 Nonrheumatic aortic (valve) stenosis: Secondary | ICD-10-CM

## 2023-12-16 DIAGNOSIS — I2510 Atherosclerotic heart disease of native coronary artery without angina pectoris: Secondary | ICD-10-CM | POA: Diagnosis not present

## 2023-12-16 DIAGNOSIS — I5043 Acute on chronic combined systolic (congestive) and diastolic (congestive) heart failure: Secondary | ICD-10-CM

## 2023-12-16 DIAGNOSIS — I110 Hypertensive heart disease with heart failure: Secondary | ICD-10-CM

## 2023-12-16 DIAGNOSIS — Z87891 Personal history of nicotine dependence: Secondary | ICD-10-CM | POA: Diagnosis not present

## 2023-12-16 DIAGNOSIS — I214 Non-ST elevation (NSTEMI) myocardial infarction: Secondary | ICD-10-CM | POA: Diagnosis not present

## 2023-12-16 HISTORY — PX: TRANSESOPHAGEAL ECHOCARDIOGRAM (CATH LAB): EP1270

## 2023-12-16 HISTORY — DX: Bacteremia: R78.81

## 2023-12-16 HISTORY — PX: RIGHT/LEFT HEART CATH AND CORONARY ANGIOGRAPHY: CATH118266

## 2023-12-16 LAB — CBC
HCT: 26 % — ABNORMAL LOW (ref 39.0–52.0)
HCT: 26.2 % — ABNORMAL LOW (ref 39.0–52.0)
Hemoglobin: 8.6 g/dL — ABNORMAL LOW (ref 13.0–17.0)
Hemoglobin: 8.5 g/dL — ABNORMAL LOW (ref 13.0–17.0)
MCH: 31.5 pg (ref 26.0–34.0)
MCH: 31.1 pg (ref 26.0–34.0)
MCHC: 33.1 g/dL (ref 30.0–36.0)
MCHC: 32.4 g/dL (ref 30.0–36.0)
MCV: 95.2 fL (ref 80.0–100.0)
MCV: 96 fL (ref 80.0–100.0)
Platelets: 137 K/uL — ABNORMAL LOW (ref 150–400)
Platelets: 116 K/uL — ABNORMAL LOW (ref 150–400)
RBC: 2.73 MIL/uL — ABNORMAL LOW (ref 4.22–5.81)
RBC: 2.73 MIL/uL — ABNORMAL LOW (ref 4.22–5.81)
RDW: 16.5 % — ABNORMAL HIGH (ref 11.5–15.5)
RDW: 16.5 % — ABNORMAL HIGH (ref 11.5–15.5)
WBC: 5.1 K/uL (ref 4.0–10.5)
WBC: 4.4 K/uL (ref 4.0–10.5)
nRBC: 0 % (ref 0.0–0.2)
nRBC: 0 % (ref 0.0–0.2)

## 2023-12-16 LAB — POCT I-STAT EG7
Acid-Base Excess: 1 mmol/L (ref 0.0–2.0)
Acid-Base Excess: 2 mmol/L (ref 0.0–2.0)
Bicarbonate: 25.9 mmol/L (ref 20.0–28.0)
Bicarbonate: 27.3 mmol/L (ref 20.0–28.0)
Calcium, Ion: 1.21 mmol/L (ref 1.15–1.40)
Calcium, Ion: 1.25 mmol/L (ref 1.15–1.40)
HCT: 24 % — ABNORMAL LOW (ref 39.0–52.0)
HCT: 25 % — ABNORMAL LOW (ref 39.0–52.0)
Hemoglobin: 8.2 g/dL — ABNORMAL LOW (ref 13.0–17.0)
Hemoglobin: 8.5 g/dL — ABNORMAL LOW (ref 13.0–17.0)
O2 Saturation: 67 %
O2 Saturation: 67 %
Potassium: 4.5 mmol/L (ref 3.5–5.1)
Potassium: 4.7 mmol/L (ref 3.5–5.1)
Sodium: 129 mmol/L — ABNORMAL LOW (ref 135–145)
Sodium: 130 mmol/L — ABNORMAL LOW (ref 135–145)
TCO2: 27 mmol/L (ref 22–32)
TCO2: 29 mmol/L (ref 22–32)
pCO2, Ven: 43.5 mmHg — ABNORMAL LOW (ref 44–60)
pCO2, Ven: 43.8 mmHg — ABNORMAL LOW (ref 44–60)
pH, Ven: 7.384 (ref 7.25–7.43)
pH, Ven: 7.402 (ref 7.25–7.43)
pO2, Ven: 35 mmHg (ref 32–45)
pO2, Ven: 36 mmHg (ref 32–45)

## 2023-12-16 LAB — POCT I-STAT 7, (LYTES, BLD GAS, ICA,H+H)
Acid-Base Excess: 2 mmol/L (ref 0.0–2.0)
Bicarbonate: 26.2 mmol/L (ref 20.0–28.0)
Calcium, Ion: 1.24 mmol/L (ref 1.15–1.40)
HCT: 24 % — ABNORMAL LOW (ref 39.0–52.0)
Hemoglobin: 8.2 g/dL — ABNORMAL LOW (ref 13.0–17.0)
O2 Saturation: 94 %
Potassium: 4.8 mmol/L (ref 3.5–5.1)
Sodium: 131 mmol/L — ABNORMAL LOW (ref 135–145)
TCO2: 27 mmol/L (ref 22–32)
pCO2 arterial: 37.3 mmHg (ref 32–48)
pH, Arterial: 7.455 — ABNORMAL HIGH (ref 7.35–7.45)
pO2, Arterial: 66 mmHg — ABNORMAL LOW (ref 83–108)

## 2023-12-16 LAB — CREATININE, SERUM
Creatinine, Ser: 7.95 mg/dL — ABNORMAL HIGH (ref 0.61–1.24)
GFR, Estimated: 7 mL/min — ABNORMAL LOW (ref >60)

## 2023-12-16 LAB — POCT I-STAT, CHEM 8
BUN: 15 mg/dL (ref 8–23)
BUN: 23 mg/dL (ref 8–23)
Calcium, Ion: 1.02 mmol/L — ABNORMAL LOW (ref 1.15–1.40)
Calcium, Ion: 1.25 mmol/L (ref 1.15–1.40)
Chloride: 107 mmol/L (ref 98–111)
Chloride: 94 mmol/L — ABNORMAL LOW (ref 98–111)
Creatinine, Ser: 5.2 mg/dL — ABNORMAL HIGH (ref 0.61–1.24)
Creatinine, Ser: 7.2 mg/dL — ABNORMAL HIGH (ref 0.61–1.24)
Glucose, Bld: 72 mg/dL (ref 70–99)
Glucose, Bld: 100 mg/dL — ABNORMAL HIGH (ref 70–99)
HCT: 15 % — ABNORMAL LOW (ref 39.0–52.0)
HCT: 25 % — ABNORMAL LOW (ref 39.0–52.0)
Hemoglobin: 5.1 g/dL — CL (ref 13.0–17.0)
Hemoglobin: 8.5 g/dL — ABNORMAL LOW (ref 13.0–17.0)
Potassium: 3.2 mmol/L — ABNORMAL LOW (ref 3.5–5.1)
Potassium: 4.6 mmol/L (ref 3.5–5.1)
Sodium: 140 mmol/L (ref 135–145)
Sodium: 132 mmol/L — ABNORMAL LOW (ref 135–145)
TCO2: 19 mmol/L — ABNORMAL LOW (ref 22–32)
TCO2: 28 mmol/L (ref 22–32)

## 2023-12-16 LAB — ECHO TEE
AR max vel: 1.34 cm2
AV Area VTI: 1.3 cm2
AV Area mean vel: 1.46 cm2
AV Mean grad: 14 mmHg
AV Peak grad: 30 mmHg
Ao pk vel: 2.74 m/s

## 2023-12-16 LAB — HEPARIN LEVEL (UNFRACTIONATED): Heparin Unfractionated: 0.33 [IU]/mL (ref 0.30–0.70)

## 2023-12-16 MED ORDER — FENTANYL CITRATE (PF) 100 MCG/2ML IJ SOLN
INTRAMUSCULAR | Status: DC | PRN
  Administered 2023-12-16: 25 ug via INTRAVENOUS

## 2023-12-16 MED ORDER — ASPIRIN 81 MG PO CHEW
81.0000 mg | CHEWABLE_TABLET | ORAL | Status: AC
  Administered 2023-12-16: 81 mg via ORAL

## 2023-12-16 MED ORDER — LIDOCAINE HCL (PF) 1 % IJ SOLN
INTRAMUSCULAR | Status: AC
  Filled 2023-12-16: qty 30

## 2023-12-16 MED ORDER — HEPARIN (PORCINE) IN NACL 1000-0.9 UT/500ML-% IV SOLN
INTRAVENOUS | Status: DC | PRN
  Administered 2023-12-16 (×2): 500 mL

## 2023-12-16 MED ORDER — CHLORHEXIDINE GLUCONATE CLOTH 2 % EX PADS: 6.0000 | MEDICATED_PAD | Freq: Every day | CUTANEOUS | Status: DC

## 2023-12-16 MED ORDER — SODIUM CHLORIDE 0.9% FLUSH: 3.0000 mL | INTRAVENOUS | Status: DC | PRN

## 2023-12-16 MED ORDER — SODIUM CHLORIDE 0.9 % IV SOLN: INTRAVENOUS | Status: DC

## 2023-12-16 MED ORDER — FENTANYL CITRATE (PF) 100 MCG/2ML IJ SOLN
INTRAMUSCULAR | Status: AC
  Filled 2023-12-16: qty 2

## 2023-12-16 MED ORDER — HEPARIN SODIUM (PORCINE) 5000 UNIT/ML IJ SOLN
5000.0000 [IU] | Freq: Three times a day (TID) | INTRAMUSCULAR | Status: DC
  Administered 2023-12-16: 5000 [IU] via SUBCUTANEOUS
  Filled 2023-12-16 (×3): qty 1

## 2023-12-16 MED ORDER — MIDAZOLAM HCL 2 MG/2ML IJ SOLN
INTRAMUSCULAR | Status: AC
Start: 2023-12-16 — End: 2023-12-16
  Filled 2023-12-16: qty 2

## 2023-12-16 MED ORDER — LABETALOL HCL 5 MG/ML IV SOLN: 10.0000 mg | INTRAVENOUS | Status: AC | PRN

## 2023-12-16 MED ORDER — SODIUM CHLORIDE 0.9 % IV SOLN: 250.0000 mL | INTRAVENOUS | Status: AC | PRN

## 2023-12-16 MED ORDER — SODIUM CHLORIDE 0.9% FLUSH
3.0000 mL | Freq: Two times a day (BID) | INTRAVENOUS | Status: DC
  Administered 2023-12-16 – 2023-12-18 (×4): 3 mL via INTRAVENOUS

## 2023-12-16 MED ORDER — HYDRALAZINE HCL 20 MG/ML IJ SOLN: 10.0000 mg | INTRAMUSCULAR | Status: AC | PRN

## 2023-12-16 MED ORDER — PROPOFOL 10 MG/ML IV BOLUS
INTRAVENOUS | Status: DC | PRN
  Administered 2023-12-16: 20 mg via INTRAVENOUS
  Administered 2023-12-16: 30 mg via INTRAVENOUS
  Administered 2023-12-16: 100 mg via INTRAVENOUS
  Administered 2023-12-16 (×2): 20 mg via INTRAVENOUS
  Administered 2023-12-16: 100 ug/kg/min via INTRAVENOUS

## 2023-12-16 MED ORDER — LIDOCAINE HCL (PF) 1 % IJ SOLN
INTRAMUSCULAR | Status: DC | PRN
  Administered 2023-12-16: 10 mL via INTRADERMAL

## 2023-12-16 MED ORDER — ASPIRIN 81 MG PO CHEW
CHEWABLE_TABLET | ORAL | Status: AC
  Filled 2023-12-16: qty 1

## 2023-12-16 MED ORDER — MIDAZOLAM HCL (PF) 2 MG/2ML IJ SOLN
INTRAMUSCULAR | Status: DC | PRN
  Administered 2023-12-16: 1 mg via INTRAVENOUS

## 2023-12-16 NOTE — Progress Notes (Signed)
 [facility] Progress Note   Subjective:   Pt has been off the floor for TEE and heart cath today, not examined today.   Objective Vitals:   12/16/23 1422 12/16/23 1427 12/16/23 1432 12/16/23 1450  BP: (!) 94/33 (!) 95/38  (!) 106/39  Pulse: 79 79 80 77  Resp: (!) 31   (!) 22  Temp:    97.6 F (36.4 C)  TempSrc:    Temporal  SpO2: 92% 92% 93% 97%  Weight:      Height:        Additional Objective Labs: Basic Metabolic Panel: Recent Labs  Lab 12/11/23 0352 12/11/23 1320 12/13/23 0412 12/15/23 1300 12/16/23 0903 12/16/23 0919  NA 134*  --  134* 133* 140 132*  K 4.2  --  4.2 4.4 3.2* 4.6  CL 93*  --  94* 91* 107 94*  CO2 25  --  25 25  --   --   GLUCOSE 149*  --  114* 125* 72 100*  BUN 27*  --  37* 37* 15 23  CREATININE 5.90*   < > 7.82* 9.58* 5.20* 7.20*  CALCIUM  8.6*  --  9.1 9.2  --   --   PHOS 4.6  --  6.7* 7.8*  --   --    < > = values in this interval not displayed.   Liver Function Tests: Recent Labs  Lab 12/11/23 0352 12/13/23 0412 12/15/23 1300  ALBUMIN  2.6* 2.4* 2.6*   No results for input(s): LIPASE, AMYLASE in the last 168 hours. CBC: Recent Labs  Lab 12/10/23 1711 12/11/23 0352 12/11/23 1320 12/12/23 0415 12/13/23 0412 12/13/23 1445 12/14/23 0650 12/15/23 0333 12/16/23 0401 12/16/23 0903 12/16/23 0919  WBC 5.8 4.1   < > 3.9* 3.5*  --  4.4 3.9* 5.1  --   --   NEUTROABS 4.9 2.9  --   --   --   --   --   --   --   --   --   HGB 7.4* 7.4*   < > 7.2* 7.0*   < > 8.7* 8.5* 8.6* 5.1* 8.5*  HCT 23.1* 23.7*   < > 22.4* 21.7*   < > 26.5* 25.8* 26.0* 15.0* 25.0*  MCV 97.5 99.6   < > 97.4 96.4  --  94.0 94.2 95.2  --   --   PLT 134* 134*   < > 131* 136*  --  159 139* 137*  --   --    < > = values in this interval not displayed.   Blood Culture    Component Value Date/Time   SDES BLOOD RIGHT ARM 12/12/2023 1104   SDES BLOOD RIGHT ARM 12/12/2023 1104   SPECREQUEST  12/12/2023 1104    BOTTLES DRAWN AEROBIC AND ANAEROBIC Blood  Culture adequate volume   SPECREQUEST  12/12/2023 1104    BOTTLES DRAWN AEROBIC AND ANAEROBIC Blood Culture adequate volume   CULT  12/12/2023 1104    NO GROWTH 4 DAYS Performed at [facility], [address], [place], [place] [address]    CULT  12/12/2023 1104    NO GROWTH 4 DAYS Performed at [facility], [address] [place], [place] [address]    REPTSTATUS PENDING 12/12/2023 1104   REPTSTATUS PENDING 12/12/2023 1104    Cardiac Enzymes: No results for input(s): CKTOTAL, CKMB, CKMBINDEX, TROPONINI in the last 168 hours. CBG: Recent Labs  Lab 12/10/23 0929 12/12/23 0827 12/14/23 1236  GLUCAP  112* 141* 130*   Iron Studies: No results for input(s): IRON, TIBC, TRANSFERRIN, FERRITIN in the last 72 hours. @lablastinr3 @ Studies/Results: CARDIAC CATHETERIZATION Result Date: 12/16/2023 Images from the original result were not included. Angiographically minimal CAD-stable from previous catheterization with maybe a 30% stenosis in the LAD and a diagonal branch that is stable. Elevated Troponin most likely consistent with Demand Ischemia without ACS/non-STEMI => elevated troponin in the setting of known aortic stenosis, ESRD on dialysis and bacteremia. Mild pulmonary hypertension with mean PA PA of 24 to 27 mmHg with a mildly elevated PCWP of 15 mmHg. Mean AVG estimated 23 mmHg suggesting Moderate AS with LVEDP 18 mmHg RECOMMENDATIONS Okay to stop IV heparin  Would not give nitrates Continue volume management by hemodialysis Alm Clay, MD  ECHO TEE Result Date: 12/16/2023    TRANSESOPHOGEAL ECHO REPORT   Patient Name:   Kirk [name] Date of Exam: 12/16/2023 Medical Rec #:  [id number]       Height:       67.0 in Accession #:    [id number]      Weight:       176.9 lb Date of Birth:  Apr 16, 1960        BSA:          1.919 m Patient Age:    63 years        BP:           141/60 mmHg Patient Gender: M               HR:           85 bpm. Exam Location:  Inpatient  Procedure: 2D Echo, Cardiac Doppler and Color Doppler (Both Spectral and Color            Flow Doppler were utilized during procedure). Indications:     Fever  History:         Patient has prior history of Echocardiogram examinations, most                  recent 12/20/2023. Previous Myocardial Infarction, COPD,                  Signs/Symptoms:Chest Pain; Risk Factors:Dyslipidemia, Diabetes,                  Hypertension and Former Smoker.  Sonographer:     Merlynn Argyle RDCS Referring Phys:  [id number] THOM CROME HALEY Diagnosing Phys: Annabella Scarce MD PROCEDURE: After discussion of the risks and benefits of a TEE, an informed consent was obtained from the patient. The transesophogeal probe was passed without difficulty through the esophogus of the patient. Imaged were obtained with the patient in a left lateral decubitus position. Sedation performed by different physician. The patient was monitored while under deep sedation. Anesthestetic sedation was provided intravenously by Anesthesiology: 302.28mg  of Propofol , 0mg  of Lidocaine . The patient's vital signs; including heart rate, blood pressure, and oxygen  saturation; remained stable throughout the procedure. The patient developed no complications during the procedure.  IMPRESSIONS  1. Left ventricular ejection fraction, by estimation, is 25 to 30%. The left ventricle has severely decreased function. The left ventricle demonstrates global hypokinesis.  2. Right ventricular systolic function is mildly reduced. The right ventricular size is normal.  3. No left atrial/left atrial appendage thrombus was detected.  4. The mitral valve is degenerative. Trivial mitral valve regurgitation. No evidence of mitral stenosis.  5. Partial fusion of the left and right coronary cusps. The aortic valve is  tricuspid. There is moderate calcification of the aortic valve. There is moderate thickening of the aortic valve. Aortic valve regurgitation is not visualized. Moderate aortic valve  stenosis. Aortic valve area, by VTI measures 1.30 cm. Aortic valve mean gradient measures 14.0 mmHg. Aortic valve Vmax measures 2.74 m/s.  6. The inferior vena cava is normal in size with greater than 50% respiratory variability, suggesting right atrial pressure of 3 mmHg. Conclusion(s)/Recommendation(s): No evidence of vegetation/infective endocarditis on this transesophageael echocardiogram. FINDINGS  Left Ventricle: Left ventricular ejection fraction, by estimation, is 25 to 30%. The left ventricle has severely decreased function. The left ventricle demonstrates global hypokinesis. The left ventricular internal cavity size was normal in size. There is no left ventricular hypertrophy. Right Ventricle: The right ventricular size is normal. No increase in right ventricular wall thickness. Right ventricular systolic function is mildly reduced. Left Atrium: Left atrial size was normal in size. No left atrial/left atrial appendage thrombus was detected. Right Atrium: Right atrial size was normal in size. Pericardium: There is no evidence of pericardial effusion. Mitral Valve: The mitral valve is degenerative in appearance. Mild mitral annular calcification. Trivial mitral valve regurgitation. No evidence of mitral valve stenosis. Tricuspid Valve: The tricuspid valve is normal in structure. Tricuspid valve regurgitation is mild . No evidence of tricuspid stenosis. Aortic Valve: Partial fusion of the left and right coronary cusps. The aortic valve is tricuspid. There is moderate calcification of the aortic valve. There is moderate thickening of the aortic valve. Aortic valve regurgitation is not visualized. Moderate aortic stenosis is present. Aortic valve mean gradient measures 14.0 mmHg. Aortic valve peak gradient measures 30.0 mmHg. Aortic valve area, by VTI measures 1.30 cm. Pulmonic Valve: The pulmonic valve was normal in structure. Pulmonic valve regurgitation is not visualized. No evidence of pulmonic stenosis.  Aorta: The aortic root and ascending aorta are structurally normal, with no evidence of dilitation. There is atheroma plaque involving the descending aorta. Venous: The inferior vena cava is normal in size with greater than 50% respiratory variability, suggesting right atrial pressure of 3 mmHg. IAS/Shunts: No atrial level shunt detected by color flow Doppler. Additional Comments: Spectral Doppler performed. LEFT VENTRICLE PLAX 2D LVOT diam:     2.30 cm LV SV:         66 LV SV Index:   35 LVOT Area:     4.15 cm  AORTIC VALVE AV Area (Vmax):    1.34 cm AV Area (Vmean):   1.46 cm AV Area (VTI):     1.30 cm AV Vmax:           274.00 cm/s AV Vmean:          171.000 cm/s AV VTI:            0.513 m AV Peak Grad:      30.0 mmHg AV Mean Grad:      14.0 mmHg LVOT Vmax:         88.40 cm/s LVOT Vmean:        60.200 cm/s LVOT VTI:          0.160 m LVOT/AV VTI ratio: 0.31  AORTA Ao Root diam: 3.50 cm Ao Asc diam:  3.60 cm TRICUSPID VALVE TR Peak grad:   48.4 mmHg TR Vmax:        348.00 cm/s  SHUNTS Systemic VTI:  0.16 m Systemic Diam: 2.30 cm Annabella Scarce MD Electronically signed by Annabella Scarce MD Signature Date/Time: 12/16/2023/1:54:31 PM    Final  EP STUDY Result Date: 12/16/2023 See surgical note for result.  Medications:  sodium chloride      [MAR Hold] cefTRIAXone  (ROCEPHIN )  IV 2 g (12/15/23 2104)    [MAR Hold] aspirin  EC  81 mg Oral Daily   [MAR Hold] Chlorhexidine  Gluconate Cloth  6 each Topical Q0600   [MAR Hold] darbepoetin (ARANESP ) injection - DIALYSIS  100 mcg Subcutaneous Q Wed-1800   [MAR Hold] lidocaine   1 patch Transdermal Q24H   [MAR Hold] lisinopril   20 mg Oral Daily   [MAR Hold] metoprolol  succinate  25 mg Oral Daily   [MAR Hold] pravastatin   40 mg Oral QHS   [MAR Hold] sevelamer  carbonate  4,800 mg Oral TID WC    Dialysis Orders: [facility] MWF  4h   B400   81.3kg   LFA AVF   Heparin  2500 Last OP HD 11/17, post wt 82kg Mircera 75 mcg q 4wks, last 11/12, due  12/10  Assessment/Plan: NSTEMI/Elevated troponin. IV Heparin /nitroglycerin   -  troponin trending up. S/p TEE and heart cath today per cardiology.  HFrEF 25-30%.  Volume overload on arrival. Optimize volume with HD.  Had extra HD Tues. Now below EDW but likely has lost some weight. UF as tolerated. Strep mitis bacteremia. Antibiotics per primary. ID following. S/p TEE today with no thrombus or mass.  ESRD: on HD MWF. Due to Thanksgiving Holiday will have dialysis on Sunday, Tuesday and Friday this week.  HD tomorrow- orders placed HTN. BP was soft after dialysis/surgery. May need to titrate meds down, will CTM for now.  Anemia. Hgb 8.5, s/p 1 unit pRBC Transfuse prbcs.  ESA recently dosed. Will increase frequency to q 2 wks - next ESA due 11/26.  2HPTH. Ca/Phos ok. Continue home meds. Asked to switch to Renvela  pills to avoid increased fluids with taking powder. Equivalent dose - 6 pills TID.  Aortic stenosis- TCTS consulted. Planning for outpatient TAVR which is now on hold d/t bacteremia. Nutrition - Renal diet w/fluid restrictions.  Lucie Collet, PA-C 12/16/2023, 3:20 PM  [facility] Pager: [phone]

## 2023-12-16 NOTE — Progress Notes (Signed)
 PROGRESS NOTE    Kirk [name]  FMW:[id number] DOB: [birth year] DOA: 12/09/2023 PCP: Leonarda Roxan [name], NP   Brief Narrative:  Kirk [name] is a 63 y.o. 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  He did complete 4 hours of hemodialysis.  His pain was, 10 out of 10 and associated with shortness of breath.  Worse when taking deep breath.  EMS was activated.   In the ER, he received full dose aspirin  324 mg x 1.  His chest pain persisted.  His twelve-lead EKG showed no evidence of acute ischemia.  High-sensitivity troponin 153 with delta, repeat 1098.  EDP discussed the case with cardiology recommended heparin  drip and admit for possible NSTEMI.    Assessment & Plan:   Principal Problem:   NSTEMI (non-ST elevated myocardial infarction) [facility]) Active Problems:   Chest pain   Streptococcal bacteremia  History of severe low-flow low gradient aortic stenosis/elevated troponin/?ACS-NSTEMI: chest pain initially thought to be due to hypotension during HD in the setting of low-flow low gradient severe aortic stenosis with a recent unremarkable left heart catheterization (10/04/23), Also, patient recently had CT coronary about 3 weeks ago which showed diffuse aortic atherosclerosis. and a history of chronic HD related chest pain (noted on patient's primary cardiologist notes). Echocardiogram showed unchanged left ventricular function however is significantly more dilated compared to prior. ECG with ST depressions, however similar ST depressions noted on prior ECGs. Also anemic. Patient received heparin  and nitroglycerin  drip for approximately 48 hours which were discontinued on 12/11/2023 per cardiology's recommendation, although his chest pain did not  improve but then it did improve with lidocaine  patch however his troponin did significantly rise and plateaued which is not consistent with his prior chronically elevated troponin and therefore cardiology now opined that he may have ACS and for that reason, he was restarted on heparin  drip on 12/13/2023 with a plan to continue for another 48 hours and TEE was rescheduled for Monday, 12/16/2023.  On 12/14/2023, patient was noted to have exertional dyspnea and presyncopal episode with minimal activity.  Troponins were once again checked and they are downtrending.  Cardiology is likely planning cath today.  Cardiology recommends continuing heparin  until then.  Appreciate cardiology help.  Sepsis secondary to gram-positive bacteremia, not POA: Upon arrival, patient did not have any signs or symptoms of sepsis.  After admission, patient spiked fever with Tmax of 102.9 at around 4 PM on 12/10/2023 along with tachycardia and tachypnea and blood culture was positive with gram-positive cocci.  Patient started on Rocephin .  Last temperature spike of 100.9 at 7 PM 12/10/2023.  Blood culture now growing Streptococcus mitis/oralis which is pansensitive.  Structural cardiology team was considering TAVR on him outpatient but per them, due to bacteremia, they will have to postpone this.  ID was consulted.  Patient was continued on Rocephin , plan was to do TEE on 12/13/2023 however due to reinitiation of the heparin  drip, this has now been postponed and is scheduled for today.  ESRD on HD/volume overload: Upon arrival to ED, patient received IV fluid bolus and was continued on slow IV fluids.  Typically receives dialysis on Monday Wednesday Friday schedule, due to volume overload, patient received dialysis on Tuesday and then on Wednesday to put him back on the schedule.  Nephrology  managing.  History of paroxysmal atrial fibrillation: On Toprol -XL 12.5.  Rate still elevated.  Not on any anticoagulation previously.  Will defer  to cardiology.  Chronic combined systolic and diastolic congestive heart failure: Volume management by dialysis.  Essential hypertension: Blood pressure remains elevated despite of Toprol -XL.  Will start him on lisinopril  20 mg.  Hyperlipidemia: Continue statin.  QTc prolongation: Upon admission, QTc was 542.  Optimize electrolytes and await QTc prolonging medications and monitor on telemetry.  DVT prophylaxis: Heparin    Code Status: Full Code  Family Communication: Wife present at bedside.  Plan of care discussed with patient in length and he/she verbalized understanding and agreed with it.  Status is: Inpatient Remains inpatient appropriate because: Scheduled for TEE and cardiac catheter today. Estimated body mass index is 27.71 kg/m as calculated from the following:   Height as of this encounter: 5' 7 (1.702 m).   Weight as of this encounter: 80.2 kg.    Nutritional Assessment: Body mass index is 27.71 kg/m.SABRA Seen by dietician.  I agree with the assessment and plan as outlined below: Nutrition Status:        . Skin Assessment: I have examined the patient's skin and I agree with the wound assessment as performed by the wound care RN as outlined below:    Consultants:  Nephrology and cardiology ID Procedures:  As above  Antimicrobials:  Anti-infectives (From admission, onward)    Start     Dose/Rate Route Frequency Ordered Stop   12/10/23 2200  cefTRIAXone  (ROCEPHIN ) 2 g in sodium chloride  0.9 % 100 mL IVPB        2 g 200 mL/hr over 30 Minutes Intravenous Every 24 hours 12/10/23 1519     12/09/23 2200  vancomycin  (VANCOCIN ) IVPB 1000 mg/200 mL premix  Status:  Discontinued        1,000 mg 200 mL/hr over 60 Minutes Intravenous  Once 12/09/23 2151 12/09/23 2153   12/09/23 2200  ceFEPIme  (MAXIPIME ) 2 g in sodium chloride  0.9 % 100 mL IVPB        2 g 200 mL/hr over 30 Minutes Intravenous  Once 12/09/23 2151 12/09/23 2337   12/09/23 2200  metroNIDAZOLE  (FLAGYL )  IVPB 500 mg        500 mg 100 mL/hr over 60 Minutes Intravenous  Once 12/09/23 2151 12/09/23 2342   12/09/23 2200  vancomycin  (VANCOREADY) IVPB 1750 mg/350 mL        1,750 mg 175 mL/hr over 120 Minutes Intravenous  Once 12/09/23 2153 12/10/23 0142         Subjective: Patient seen and examined.  Still with mild chest pain but no shortness of breath.  No new complaint.  Objective: Vitals:   12/15/23 2010 12/15/23 2320 12/15/23 2328 12/16/23 0325  BP: (!) 155/74  (!) 154/77 (!) 164/68  Pulse: 84  80 76  Resp: 20 20 19 18   Temp: 98 F (36.7 C)  98 F (36.7 C) 98.5 F (36.9 C)  TempSrc: Oral  Oral Oral  SpO2: 98%  97% 100%  Weight:    80.2 kg  Height:        Intake/Output Summary (Last 24 hours) at 12/16/2023 0746 Last data filed at 12/15/2023 1643 [place] per 24 hour  Intake --  Output 2100 ml  Net -2100 ml   Filed Weights   12/15/23 1248 12/15/23 1643 12/16/23 0325  Weight: 82.3 kg 80 kg 80.2 kg    Examination:  General exam: Appears calm and comfortable  Respiratory  system: Clear to auscultation. Respiratory effort normal. Cardiovascular system: S1 & S2 heard, RRR. No JVD, murmurs, rubs, gallops or clicks. No pedal edema. Gastrointestinal system: Abdomen is nondistended, soft and nontender. No organomegaly or masses felt. Normal bowel sounds heard. Central nervous system: Alert and oriented. No focal neurological deficits. Extremities: Symmetric 5 x 5 power. Skin: No rashes, lesions or ulcers.  Psychiatry: Judgement and insight appear normal. Mood & affect appropriate.   Data Reviewed: I have personally reviewed following labs and imaging studies  CBC: Recent Labs  Lab 12/10/23 1711 12/11/23 0352 12/11/23 1320 12/12/23 0415 12/13/23 0412 12/13/23 1445 12/14/23 0650 12/15/23 0333 12/16/23 0401  WBC 5.8 4.1   < > 3.9* 3.5*  --  4.4 3.9* 5.1  NEUTROABS 4.9 2.9  --   --   --   --   --   --   --   HGB 7.4* 7.4*   < > 7.2* 7.0* 8.6* 8.7* 8.5* 8.6*  HCT  23.1* 23.7*   < > 22.4* 21.7* 25.4* 26.5* 25.8* 26.0*  MCV 97.5 99.6   < > 97.4 96.4  --  94.0 94.2 95.2  PLT 134* 134*   < > 131* 136*  --  159 139* 137*   < > = values in this interval not displayed.   Basic Metabolic Panel: Recent Labs  Lab 12/09/23 2131 12/09/23 2228 12/10/23 1004 12/11/23 0352 12/11/23 1320 12/13/23 0412 12/15/23 1300  NA 135 136 136 134*  --  134* 133*  K 3.4* 3.7 4.8 4.2  --  4.2 4.4  CL 92* 93* 93* 93*  --  94* 91*  CO2 28  --  25 25  --  25 25  GLUCOSE 136* 113* 121* 149*  --  114* 125*  BUN 13 15 20  27*  --  37* 37*  CREATININE 5.37* 5.60* 6.47* 5.90* 7.00* 7.82* 9.58*  CALCIUM  8.2*  --  8.3* 8.6*  --  9.1 9.2  MG  --   --  1.9  --   --   --   --   PHOS  --   --  4.4 4.6  --  6.7* 7.8*   GFR: Estimated Creatinine Clearance: 8.2 mL/min (A) (by C-G formula based on SCr of 9.58 mg/dL (H)). Liver Function Tests: Recent Labs  Lab 12/10/23 1004 12/11/23 0352 12/13/23 0412 12/15/23 1300  ALBUMIN  2.8* 2.6* 2.4* 2.6*   No results for input(s): LIPASE, AMYLASE in the last 168 hours. No results for input(s): AMMONIA in the last 168 hours. Coagulation Profile: Recent Labs  Lab 12/09/23 2221  INR 1.1   Cardiac Enzymes: No results for input(s): CKTOTAL, CKMB, CKMBINDEX, TROPONINI in the last 168 hours. BNP (last 3 results) No results for input(s): PROBNP in the last 8760 hours. HbA1C: No results for input(s): HGBA1C in the last 72 hours. CBG: Recent Labs  Lab 12/10/23 0929 12/12/23 0827 12/14/23 1236  GLUCAP 112* 141* 130*   Lipid Profile: No results for input(s): CHOL, HDL, LDLCALC, TRIG, CHOLHDL, LDLDIRECT in the last 72 hours. Thyroid  Function Tests: No results for input(s): TSH, T4TOTAL, FREET4, T3FREE, THYROIDAB in the last 72 hours. Anemia Panel: No results for input(s): VITAMINB12, FOLATE, FERRITIN, TIBC, IRON, RETICCTPCT in the last 72 hours. Sepsis Labs: Recent Labs  Lab  12/09/23 2228  LATICACIDVEN 1.0    Recent Results (from the past 240 hours)  Resp panel by RT-PCR (RSV, Flu A&B, Covid) Anterior Nasal Swab     Status: None  Collection Time: 12/09/23 10:21 PM   Specimen: Anterior Nasal Swab  Result Value Ref Range Status   SARS Coronavirus 2 by RT PCR NEGATIVE NEGATIVE Final   Influenza A by PCR NEGATIVE NEGATIVE Final   Influenza B by PCR NEGATIVE NEGATIVE Final    Comment: (NOTE) The Xpert Xpress SARS-CoV-2/FLU/RSV plus assay is intended as an aid in the diagnosis of influenza from Nasopharyngeal swab specimens and should not be used as a sole basis for treatment. Nasal washings and aspirates are unacceptable for Xpert Xpress SARS-CoV-2/FLU/RSV testing.  Fact Sheet for Patients: bloggercourse.com  Fact Sheet for Healthcare Providers: seriousbroker.it  This test is not yet approved or cleared by the [place]  FDA and has been authorized for detection and/or diagnosis of SARS-CoV-2 by FDA under an Emergency Use Authorization (EUA). This EUA will remain in effect (meaning this test can be used) for the duration of the COVID-19 declaration under Section 564(b)(1) of the Act, 21 U.S.C. section 360bbb-3(b)(1), unless the authorization is terminated or revoked.     Resp Syncytial Virus by PCR NEGATIVE NEGATIVE Final    Comment: (NOTE) Fact Sheet for Patients: bloggercourse.com  Fact Sheet for Healthcare Providers: seriousbroker.it  This test is not yet approved or cleared by the [place]  FDA and has been authorized for detection and/or diagnosis of SARS-CoV-2 by FDA under an Emergency Use Authorization (EUA). This EUA will remain in effect (meaning this test can be used) for the duration of the COVID-19 declaration under Section 564(b)(1) of the Act, 21 U.S.C. section 360bbb-3(b)(1), unless the authorization is terminated  or revoked.  Performed at [facility], [address], [place], [place] [address]   Blood Culture (routine x 2)     Status: Abnormal   Collection Time: 12/09/23 10:21 PM   Specimen: BLOOD RIGHT ARM  Result Value Ref Range Status   Specimen Description BLOOD RIGHT ARM  Final   Special Requests   Final    BOTTLES DRAWN AEROBIC AND ANAEROBIC Blood Culture adequate volume   Culture  Setup Time   Final    GRAM POSITIVE COCCI IN CHAINS IN BOTH AEROBIC AND ANAEROBIC BOTTLES CRITICAL RESULT CALLED TO, READ BACK BY AND VERIFIED WITH: PHARMD J.FRENS AT 1400 ON 12/10/2023 BY T.SAAD.  Performed at [facility], [address] [place], [place] [address]    Culture STREPTOCOCCUS MITIS/ORALIS (A)  Final   Report Status 12/12/2023 FINAL  Final   Organism ID, Bacteria STREPTOCOCCUS MITIS/ORALIS  Final      Susceptibility   Streptococcus mitis/oralis - MIC*    PENICILLIN <=0.06 SENSITIVE Sensitive     CEFTRIAXONE  <=0.12 SENSITIVE Sensitive     LEVOFLOXACIN  1 SENSITIVE Sensitive     VANCOMYCIN  0.5 SENSITIVE Sensitive     * STREPTOCOCCUS MITIS/ORALIS  Blood Culture (routine x 2)     Status: Abnormal   Collection Time: 12/09/23 10:21 PM   Specimen: BLOOD RIGHT ARM  Result Value Ref Range Status   Specimen Description BLOOD RIGHT ARM  Final   Special Requests   Final    BOTTLES DRAWN AEROBIC AND ANAEROBIC Blood Culture adequate volume   Culture  Setup Time   Final    GRAM POSITIVE COCCI IN CHAINS IN BOTH AEROBIC AND ANAEROBIC BOTTLES CRITICAL VALUE NOTED.  VALUE IS CONSISTENT WITH PREVIOUSLY REPORTED AND CALLED VALUE.    Culture (A)  Final    STREPTOCOCCUS MITIS/ORALIS SUSCEPTIBILITIES PERFORMED ON PREVIOUS CULTURE WITHIN THE LAST 5 DAYS. Performed at [facility],  [address], [place], [place] [address]    Report Status 12/12/2023 FINAL  Final  Blood Culture ID Panel (Reflexed)     Status: Abnormal   Collection Time: 12/09/23 10:21 PM  Result Value Ref Range Status    Enterococcus faecalis NOT DETECTED NOT DETECTED Final   Enterococcus Faecium NOT DETECTED NOT DETECTED Final   Listeria monocytogenes NOT DETECTED NOT DETECTED Final   Staphylococcus species NOT DETECTED NOT DETECTED Final   Staphylococcus aureus (BCID) NOT DETECTED NOT DETECTED Final   Staphylococcus epidermidis NOT DETECTED NOT DETECTED Final   Staphylococcus lugdunensis NOT DETECTED NOT DETECTED Final   Streptococcus species DETECTED (A) NOT DETECTED Final    Comment: Not Enterococcus species, Streptococcus agalactiae, Streptococcus pyogenes, or Streptococcus pneumoniae. CRITICAL RESULT CALLED TO, READ BACK BY AND VERIFIED WITH: PHARMD J.FRENS AT 1400 ON 12/10/2023 BY T.SAAD.     Streptococcus agalactiae NOT DETECTED NOT DETECTED Final   Streptococcus pneumoniae NOT DETECTED NOT DETECTED Final   Streptococcus pyogenes NOT DETECTED NOT DETECTED Final   A.calcoaceticus-baumannii NOT DETECTED NOT DETECTED Final   Bacteroides fragilis NOT DETECTED NOT DETECTED Final   Enterobacterales NOT DETECTED NOT DETECTED Final   Enterobacter cloacae complex NOT DETECTED NOT DETECTED Final   Escherichia coli NOT DETECTED NOT DETECTED Final   Klebsiella aerogenes NOT DETECTED NOT DETECTED Final   Klebsiella oxytoca NOT DETECTED NOT DETECTED Final   Klebsiella pneumoniae NOT DETECTED NOT DETECTED Final   Proteus species NOT DETECTED NOT DETECTED Final   Salmonella species NOT DETECTED NOT DETECTED Final   Serratia marcescens NOT DETECTED NOT DETECTED Final   Haemophilus influenzae NOT DETECTED NOT DETECTED Final   Neisseria meningitidis NOT DETECTED NOT DETECTED Final   Pseudomonas aeruginosa NOT DETECTED NOT DETECTED Final   Stenotrophomonas maltophilia NOT DETECTED NOT DETECTED Final   Candida albicans NOT DETECTED NOT DETECTED Final   Candida auris NOT DETECTED NOT DETECTED Final   Candida glabrata NOT DETECTED NOT DETECTED Final   Candida krusei NOT DETECTED NOT DETECTED Final   Candida  parapsilosis NOT DETECTED NOT DETECTED Final   Candida tropicalis NOT DETECTED NOT DETECTED Final   Cryptococcus neoformans/gattii NOT DETECTED NOT DETECTED Final    Comment: Performed at [facility], [address], [place], [place] [address]  Culture, blood (Routine X 2) w Reflex to ID Panel     Status: None (Preliminary result)   Collection Time: 12/12/23 11:04 AM   Specimen: BLOOD RIGHT ARM  Result Value Ref Range Status   Specimen Description BLOOD RIGHT ARM  Final   Special Requests   Final    BOTTLES DRAWN AEROBIC AND ANAEROBIC Blood Culture adequate volume   Culture   Final    NO GROWTH 4 DAYS Performed at [facility], [address], [place], [place] [address]    Report Status PENDING  Incomplete  Culture, blood (Routine X 2) w Reflex to ID Panel     Status: None (Preliminary result)   Collection Time: 12/12/23 11:04 AM   Specimen: BLOOD RIGHT ARM  Result Value Ref Range Status   Specimen Description BLOOD RIGHT ARM  Final   Special Requests   Final    BOTTLES DRAWN AEROBIC AND ANAEROBIC Blood Culture adequate volume   Culture   Final    NO GROWTH 4 DAYS Performed at [facility], [address] [place], [place] [address]    Report Status PENDING  Incomplete     Radiology Studies: No results found.  Scheduled Meds:  aspirin  EC  81 mg Oral Daily   Chlorhexidine  Gluconate Cloth  6 each Topical Q0600   [START ON 12/18/2023] darbepoetin (ARANESP ) injection - DIALYSIS  100 mcg Subcutaneous Q Wed-1800   lidocaine   1 patch Transdermal Q24H   lisinopril   20 mg Oral Daily   metoprolol  succinate  25 mg Oral Daily   pravastatin   40 mg Oral QHS   sevelamer  carbonate  4,800 mg Oral TID WC   Continuous Infusions:  cefTRIAXone  (ROCEPHIN )  IV 2 g (12/15/23 2104)   heparin  1,850 Units/hr (12/16/23 0246)     LOS: 6 days   Fredia Skeeter, MD [facility]  12/16/2023, 7:46 AM   *Please note that this is a verbal dictation therefore any spelling  or grammatical errors are due to the Dragon Medical One system interpretation.  Please page via Amion and do not message via secure chat for urgent patient care matters. Secure chat can be used for non urgent patient care matters.  How to contact the [facility] Attending or Consulting provider 7A - 7P or covering provider during after hours 7P -7A, for this patient?  Check the care team in [facility] and look for a) attending/consulting [facility] provider listed and b) the [facility] team listed. Page or secure chat 7A-7P. Log into [email or web address] and use [place]'s universal password to access. If you do not have the password, please contact the hospital operator. Locate the [facility] provider you are looking for under [facility] and page to a number that you can be directly reached. If you still have difficulty reaching the provider, please page the [facility] (Director on Call) for the Hospitalists listed on amion for assistance.

## 2023-12-16 NOTE — Progress Notes (Signed)
 Plan for TEE and cardiac catherization today.

## 2023-12-16 NOTE — Progress Notes (Signed)
  Echocardiogram Echocardiogram Transesophageal has been performed.  Kirk [name] 12/16/2023, 11:38 AM

## 2023-12-16 NOTE — CV Procedure (Signed)
 Brief TEE Note  LVEF 25-30% Global hypokinesis. No LA/LAA thrombus or masses. Aortic valve is trileaflet.  Calcified with partial fusion of the left and right coronary cusps.  Moderate aortic stenosis. Trivial TR.  Mitral annular calcification. No evidence of endocarditis.  For additional details see full report.   Ayuub Penley C. Raford, MD, [facility] 12/16/2023 11:10 AM

## 2023-12-16 NOTE — Plan of Care (Signed)

## 2023-12-16 NOTE — Interval H&P Note (Signed)
 History and Physical Interval Note:  12/16/2023 9:04 AM  Kirk [name]  has presented today for surgery, with the diagnosis of bacteremia.  The various methods of treatment have been discussed with the patient and family. After consideration of risks, benefits and other options for treatment, the patient has consented to  Procedure(s): TRANSESOPHAGEAL ECHOCARDIOGRAM (N/A) as a surgical intervention.  The patient's history has been reviewed, patient examined, no change in status, stable for surgery.  I have reviewed the patient's chart and labs.  Questions were answered to the patient's satisfaction.     Annabella Scarce, MD

## 2023-12-16 NOTE — Transfer of Care (Signed)
 Immediate Anesthesia Transfer of Care Note  Patient: Kirk [name]  Procedure(s) Performed: TRANSESOPHAGEAL ECHOCARDIOGRAM  Patient Location: PACU and Cath Lab  Anesthesia Type:MAC  Level of Consciousness: awake  Airway & Oxygen  Therapy: Patient Spontanous Breathing  Post-op Assessment: Report given to RN and Post -op Vital signs reviewed and stable  Post vital signs: Reviewed and stable  Last Vitals:  Vitals Value Taken Time  BP 115/59 12/16/23 11:09  Temp 36.6 C 12/16/23 11:09  Pulse 77 12/16/23 11:11  Resp 13 12/16/23 11:11  SpO2 96 % 12/16/23 11:11  Vitals shown include unfiled device data.  Last Pain:  Vitals:   12/16/23 1109  TempSrc: Tympanic  PainSc: Asleep      Patients Stated Pain Goal: 0 (12/11/23 2357)  Complications: No notable events documented.

## 2023-12-16 NOTE — Progress Notes (Signed)
   Patient had TEE as well as Right and Left Heart Cath Today  TEE showed reduced EF of 25 to 30% global hypokinesis.  No LAA thrombus or mass.  No evidence of vegetation/endocarditis.  Moderate aortic stenosis suggested.   Right and Left Heart Catheterization showed normal CAD with mildly elevated LVEDP and PCWP as well as Mild Pulmonary Hypertension. => Would avoid nitrates => Stop IV heparin  volume management by HD => Low BP limits GDMT  Presentation of presyncope could very well be in the setting of sepsis complicated by aortic stenosis.  No new recommendations today.  Full formal rounding note tomorrow   Kirk Clay, MD

## 2023-12-16 NOTE — Interval H&P Note (Signed)
 History and Physical Interval Note:  12/16/2023 1:32 PM  Kirk [name]  has presented today for surgery, with the diagnosis of nstemi.  The various methods of treatment have been discussed with the patient and family. After consideration of risks, benefits and other options for treatment, the patient has consented to  Procedure(s): RIGHT/LEFT HEART CATH AND CORONARY ANGIOGRAPHY (N/A) . PERCUTANEOUS CORONARY INTERVENTION  as a surgical intervention.  The patient's history has been reviewed, patient examined, no change in status, stable for surgery.  I have reviewed the patient's chart and labs.  Questions were answered to the patient's satisfaction.  .   Cath Lab Visit (complete for each Cath Lab visit)  Clinical Evaluation Leading to the Procedure:   ACS: Yes.    Non-ACS:    Anginal Classification: CCS III  Anti-ischemic medical therapy: Maximal Therapy (2 or more classes of medications)  Non-Invasive Test Results: No non-invasive testing performed  Prior CABG: No previous CABG     Alm Clay

## 2023-12-16 NOTE — Progress Notes (Signed)
 PHARMACY - ANTICOAGULATION CONSULT NOTE  Pharmacy Consult for heparin  Indication: chest pain/ACS  Allergies  Allergen Reactions   Codeine Nausea And Vomiting   Lortab [Hydrocodone -Acetaminophen ] Nausea And Vomiting    Patient Measurements: Height: 5' 7 (170.2 cm) Weight: 80.2 kg (176 lb 14.4 oz) IBW/kg (Calculated) : 66.1 HEPARIN  DW (KG): 82  Vital Signs: Temp: 98.5 F (36.9 C) (11/24 0325) Temp Source: Oral (11/24 0325) BP: 164/68 (11/24 0325) Pulse Rate: 76 (11/24 0325)  Labs: Recent Labs    12/14/23 0650 12/14/23 1257 12/14/23 1457 12/14/23 2004 12/15/23 0333 12/15/23 0920 12/15/23 1300 12/15/23 1921 12/16/23 0401  HGB 8.7*  --   --   --  8.5*  --   --   --  8.6*  HCT 26.5*  --   --   --  25.8*  --   --   --  26.0*  PLT 159  --   --   --  139*  --   --   --  137*  HEPARINUNFRC 0.14*  --   --    < > 0.27* 0.27*  --  0.41 0.33  CREATININE  --   --   --   --   --   --  9.58*  --   --   TROPONINIHS  --  1,107* 971*  --   --   --   --   --   --    < > = values in this interval not displayed.    Estimated Creatinine Clearance: 8.2 mL/min (A) (by C-G formula based on SCr of 9.58 mg/dL (H)).   Assessment: 63 y/o M initially presented to the ED with chest pain. Has ESRD on HD. Was started on heparin  and then stopped with concerns for demand ischemia. Heparin  resumed 11/21 with ongoing episodes of chest discomfort.  Heparin  level therapeutic, CBC ok, possible cath today.  Goal of Therapy:  Heparin  level 0.3-0.7 units/ml Monitor platelets by anticoagulation protocol: Yes   Plan:  -Continue heparin  1850 units/h -Daily heparin  level and CBC  Ozell Jamaica, PharmD, BCPS, [facility] Clinical Pharmacist [phone] Please check AMION for all [facility] Pharmacy numbers 12/16/2023

## 2023-12-17 DIAGNOSIS — R7881 Bacteremia: Secondary | ICD-10-CM | POA: Diagnosis not present

## 2023-12-17 DIAGNOSIS — B955 Unspecified streptococcus as the cause of diseases classified elsewhere: Secondary | ICD-10-CM

## 2023-12-17 DIAGNOSIS — I21A1 Myocardial infarction type 2: Secondary | ICD-10-CM | POA: Diagnosis not present

## 2023-12-17 DIAGNOSIS — Z992 Dependence on renal dialysis: Secondary | ICD-10-CM | POA: Diagnosis not present

## 2023-12-17 DIAGNOSIS — B954 Other streptococcus as the cause of diseases classified elsewhere: Secondary | ICD-10-CM | POA: Diagnosis not present

## 2023-12-17 DIAGNOSIS — I428 Other cardiomyopathies: Secondary | ICD-10-CM

## 2023-12-17 DIAGNOSIS — N186 End stage renal disease: Secondary | ICD-10-CM | POA: Diagnosis not present

## 2023-12-17 DIAGNOSIS — I350 Nonrheumatic aortic (valve) stenosis: Secondary | ICD-10-CM | POA: Diagnosis not present

## 2023-12-17 DIAGNOSIS — R079 Chest pain, unspecified: Secondary | ICD-10-CM | POA: Diagnosis not present

## 2023-12-17 DIAGNOSIS — I5042 Chronic combined systolic (congestive) and diastolic (congestive) heart failure: Secondary | ICD-10-CM

## 2023-12-17 DIAGNOSIS — I214 Non-ST elevation (NSTEMI) myocardial infarction: Secondary | ICD-10-CM | POA: Diagnosis not present

## 2023-12-17 LAB — RENAL FUNCTION PANEL
Albumin: 2.9 g/dL — ABNORMAL LOW (ref 3.5–5.0)
Anion gap: 13 (ref 5–15)
BUN: 32 mg/dL — ABNORMAL HIGH (ref 8–23)
CO2: 25 mmol/L (ref 22–32)
Calcium: 9.4 mg/dL (ref 8.9–10.3)
Chloride: 90 mmol/L — ABNORMAL LOW (ref 98–111)
Creatinine, Ser: 10 mg/dL — ABNORMAL HIGH (ref 0.61–1.24)
GFR, Estimated: 5 mL/min — ABNORMAL LOW (ref >60)
Glucose, Bld: 102 mg/dL — ABNORMAL HIGH (ref 70–99)
Phosphorus: 6.5 mg/dL — ABNORMAL HIGH (ref 2.5–4.6)
Potassium: 5.1 mmol/L (ref 3.5–5.1)
Sodium: 128 mmol/L — ABNORMAL LOW (ref 135–145)

## 2023-12-17 LAB — CBC
HCT: 25.9 % — ABNORMAL LOW (ref 39.0–52.0)
HCT: 24.8 % — ABNORMAL LOW (ref 39.0–52.0)
Hemoglobin: 8.7 g/dL — ABNORMAL LOW (ref 13.0–17.0)
Hemoglobin: 8.1 g/dL — ABNORMAL LOW (ref 13.0–17.0)
MCH: 31.4 pg (ref 26.0–34.0)
MCH: 31 pg (ref 26.0–34.0)
MCHC: 33.6 g/dL (ref 30.0–36.0)
MCHC: 32.7 g/dL (ref 30.0–36.0)
MCV: 93.5 fL (ref 80.0–100.0)
MCV: 95 fL (ref 80.0–100.0)
Platelets: 136 K/uL — ABNORMAL LOW (ref 150–400)
Platelets: 119 K/uL — ABNORMAL LOW (ref 150–400)
RBC: 2.77 MIL/uL — ABNORMAL LOW (ref 4.22–5.81)
RBC: 2.61 MIL/uL — ABNORMAL LOW (ref 4.22–5.81)
RDW: 16.3 % — ABNORMAL HIGH (ref 11.5–15.5)
RDW: 16.4 % — ABNORMAL HIGH (ref 11.5–15.5)
WBC: 6.2 K/uL (ref 4.0–10.5)
WBC: 5.5 K/uL (ref 4.0–10.5)
nRBC: 0 % (ref 0.0–0.2)
nRBC: 0 % (ref 0.0–0.2)

## 2023-12-17 LAB — CULTURE, BLOOD (ROUTINE X 2)
Culture: NO GROWTH
Culture: NO GROWTH
Special Requests: ADEQUATE
Special Requests: ADEQUATE

## 2023-12-17 MED ORDER — METOPROLOL SUCCINATE ER 25 MG PO TB24
37.5000 mg | ORAL_TABLET | Freq: Every day | ORAL | Status: DC
  Administered 2023-12-18: 37.5 mg via ORAL
  Filled 2023-12-17: qty 2

## 2023-12-17 MED ORDER — HYDRALAZINE HCL 50 MG PO TABS
50.0000 mg | ORAL_TABLET | Freq: Three times a day (TID) | ORAL | Status: DC
  Administered 2023-12-17 – 2023-12-18 (×2): 50 mg via ORAL
  Filled 2023-12-17 (×2): qty 1

## 2023-12-17 MED ORDER — GUAIFENESIN 100 MG/5ML PO LIQD
5.0000 mL | ORAL | Status: DC | PRN
  Administered 2023-12-17 – 2023-12-18 (×2): 5 mL via ORAL
  Filled 2023-12-17 (×2): qty 10

## 2023-12-17 MED ORDER — OXYCODONE HCL 5 MG PO TABS
5.0000 mg | ORAL_TABLET | ORAL | Status: DC | PRN
  Administered 2023-12-17: 5 mg via ORAL
  Filled 2023-12-17: qty 1

## 2023-12-17 MED ORDER — CEFAZOLIN IV (FOR PTA / DISCHARGE USE ONLY): 2.0000 g | INTRAVENOUS | Status: AC

## 2023-12-17 MED ORDER — CEFAZOLIN SODIUM-DEXTROSE 2-4 GM/100ML-% IV SOLN
2.0000 g | INTRAVENOUS | Status: DC
Start: 2023-12-17 — End: 2023-12-24
  Administered 2023-12-17: 2 g via INTRAVENOUS
  Filled 2023-12-17: qty 100

## 2023-12-17 NOTE — TOC Progression Note (Signed)
 Transition of Care [facility]) - Progression Note    Patient Details  Name: Kirk [name] MRN: [id number] Date of Birth: 1960-08-13  Transition of Care [facility]) CM/SW Contact  Graves-Bigelow, Erminio Deems, RN Phone Number: 12/17/2023, 12:35 PM  Clinical Narrative:  ICM spoke with patient regarding PT/OT recommendations. Patient is declining Home Health Services PT/OT states he does not want anyone in his home. Spouse was at the bedside. Patient states he has DME rolling walker and cane if needed. Patient states he has PCP and transportation to get to appointments. No further needs identified.   Expected Discharge Plan: Home/Self Care Barriers to Discharge: No Barriers Identified  Expected Discharge Plan and Services In-house Referral: NA Discharge Planning Services: CM Consult Post Acute Care Choice: NA Living arrangements for the past 2 months: Single Family Home                   DME Agency: NA       HH Arranged: Refused HH  Social Drivers of Health (SDOH) Interventions SDOH Screenings   Food Insecurity: Food Insecurity Present (12/11/2023)  Housing: Low Risk  (12/11/2023)  Transportation Needs: Unmet Transportation Needs (12/11/2023)  Utilities: At Risk (12/11/2023)  Alcohol Screen: Low Risk  (02/03/2018)  Depression (PHQ2-9): Low Risk  (01/29/2023)  Financial Resource Strain: Medium Risk (11/02/2021)  Physical Activity: Insufficiently Active (06/25/2017)  Social Connections: Socially Isolated (12/11/2023)  Stress: No Stress Concern Present (06/25/2017)  Tobacco Use: Medium Risk (12/16/2023)    Readmission Risk Interventions    03/18/2023    5:03 PM 01/11/2023    5:26 PM  Readmission Risk Prevention Plan  Transportation Screening Complete Complete  Medication Review (RN Care Manager) Complete Complete  PCP or Specialist appointment within 3-5 days of discharge Complete Complete  HRI or Home Care Consult Complete Complete  Palliative Care Screening Not Applicable Not  Applicable  Skilled Nursing Facility Not Applicable Not Applicable

## 2023-12-17 NOTE — Progress Notes (Signed)
 PHARMACY CONSULT NOTE FOR:  OUTPATIENT  PARENTERAL ANTIBIOTIC THERAPY with Dialysis   Indication: Strep mitis bacteremia Regimen: Cefazoin 2 gm every Tuesday and Friday x 2 doses this week post-HD End date: 12/20/23  No formal OPAT will be done as patient will receive antibiotics with HD. Nephrology aware. Informational order placed.   Thank you for allowing pharmacy to be a part of this patient's care.  Damien Quiet, PharmD, BCPS, BCIDP Infectious Diseases Clinical Pharmacist Phone: [phone] 12/17/2023, 12:38 PM

## 2023-12-17 NOTE — Progress Notes (Signed)
 Patient ID: Kirk [name], male   DOB: April 17, 1960, 63 y.o.   MRN: [id number] S: Feels well, no new complaints. O:BP (!) 150/64 (BP Location: Right Arm)   Pulse 76   Temp 97.8 F (36.6 C) (Oral)   Resp 20   Ht 5' 7 (1.702 m)   Wt 80.5 kg   SpO2 100%   BMI 27.80 kg/m   Intake/Output Summary (Last 24 hours) at 12/17/2023 1021 Last data filed at 12/17/2023 0600 Gross per 24 hour  Intake 1187.69 ml  Output 0 ml  Net 1187.69 ml   Intake/Output: I/O last 3 completed shifts: In: 1187.7 [P.O.:480; I.V.:507.7; IV Piggyback:200] Out: 0   Intake/Output this shift:  No intake/output data recorded. Weight change: -1.787 kg Gen: NAD CVS: RRR Resp:CTA Abd: +BS, soft, NT/ND Ext: no edema, L AVF +T/B  Recent Labs  Lab 12/11/23 0352 12/11/23 1320 12/13/23 0412 12/15/23 1300 12/16/23 0903 12/16/23 0919 12/16/23 1415 12/16/23 1420 12/16/23 1421 12/16/23 1554  NA 134*  --  134* 133* 140 132* 131* 129* 130*  --   K 4.2  --  4.2 4.4 3.2* 4.6 4.8 4.5 4.7  --   CL 93*  --  94* 91* 107 94*  --   --   --   --   CO2 25  --  25 25  --   --   --   --   --   --   GLUCOSE 149*  --  114* 125* 72 100*  --   --   --   --   BUN 27*  --  37* 37* 15 23  --   --   --   --   CREATININE 5.90* 7.00* 7.82* 9.58* 5.20* 7.20*  --   --   --  7.95*  ALBUMIN  2.6*  --  2.4* 2.6*  --   --   --   --   --   --   CALCIUM  8.6*  --  9.1 9.2  --   --   --   --   --   --   PHOS 4.6  --  6.7* 7.8*  --   --   --   --   --   --    Liver Function Tests: Recent Labs  Lab 12/11/23 0352 12/13/23 0412 12/15/23 1300  ALBUMIN  2.6* 2.4* 2.6*   No results for input(s): LIPASE, AMYLASE in the last 168 hours. No results for input(s): AMMONIA in the last 168 hours. CBC: Recent Labs  Lab 12/10/23 1711 12/11/23 0352 12/11/23 1320 12/14/23 0650 12/15/23 0333 12/16/23 0401 12/16/23 0903 12/16/23 1421 12/16/23 1554 12/17/23 0457  WBC 5.8 4.1   < > 4.4 3.9* 5.1  --   --  4.4 6.2  NEUTROABS 4.9 2.9  --   --    --   --   --   --   --   --   HGB 7.4* 7.4*   < > 8.7* 8.5* 8.6*   < > 8.5* 8.5* 8.7*  HCT 23.1* 23.7*   < > 26.5* 25.8* 26.0*   < > 25.0* 26.2* 25.9*  MCV 97.5 99.6   < > 94.0 94.2 95.2  --   --  96.0 93.5  PLT 134* 134*   < > 159 139* 137*  --   --  116* 136*   < > = values in this interval not displayed.   Cardiac Enzymes: No results for input(s): CKTOTAL, CKMB, CKMBINDEX,  TROPONINI in the last 168 hours. CBG: Recent Labs  Lab 12/12/23 0827 12/14/23 1236  GLUCAP 141* 130*    Iron Studies: No results for input(s): IRON, TIBC, TRANSFERRIN, FERRITIN in the last 72 hours. Studies/Results: CARDIAC CATHETERIZATION Result Date: 12/16/2023 Images from the original result were not included. Angiographically minimal CAD-stable from previous catheterization with maybe a 30% stenosis in the LAD and a diagonal branch that is stable. Elevated Troponin most likely consistent with Demand Ischemia without ACS/non-STEMI => elevated troponin in the setting of known aortic stenosis, ESRD on dialysis and bacteremia. Mild pulmonary hypertension with mean PA PA of 24 to 27 mmHg with a mildly elevated PCWP of 15 mmHg. Mean AVG estimated 23 mmHg suggesting Moderate AS with LVEDP 18 mmHg RECOMMENDATIONS Okay to stop IV heparin  Would not give nitrates Continue volume management by hemodialysis Alm Clay, MD  ECHO TEE Result Date: 12/16/2023    TRANSESOPHOGEAL ECHO REPORT   Patient Name:   Kirk [name] Date of Exam: 12/16/2023 Medical Rec #:  [id number]       Height:       67.0 in Accession #:    [id number]      Weight:       176.9 lb Date of Birth:  1960/11/25        BSA:          1.919 m Patient Age:    63 years        BP:           141/60 mmHg Patient Gender: M               HR:           85 bpm. Exam Location:  Inpatient Procedure: 2D Echo, Cardiac Doppler and Color Doppler (Both Spectral and Color            Flow Doppler were utilized during procedure). Indications:     Fever  History:          Patient has prior history of Echocardiogram examinations, most                  recent 12/20/2023. Previous Myocardial Infarction, COPD,                  Signs/Symptoms:Chest Pain; Risk Factors:Dyslipidemia, Diabetes,                  Hypertension and Former Smoker.  Sonographer:     Merlynn Argyle RDCS Referring Phys:  [id number] THOM CROME HALEY Diagnosing Phys: Annabella Scarce MD PROCEDURE: After discussion of the risks and benefits of a TEE, an informed consent was obtained from the patient. The transesophogeal probe was passed without difficulty through the esophogus of the patient. Imaged were obtained with the patient in a left lateral decubitus position. Sedation performed by different physician. The patient was monitored while under deep sedation. Anesthestetic sedation was provided intravenously by Anesthesiology: 302.28mg  of Propofol , 0mg  of Lidocaine . The patient's vital signs; including heart rate, blood pressure, and oxygen  saturation; remained stable throughout the procedure. The patient developed no complications during the procedure.  IMPRESSIONS  1. Left ventricular ejection fraction, by estimation, is 25 to 30%. The left ventricle has severely decreased function. The left ventricle demonstrates global hypokinesis.  2. Right ventricular systolic function is mildly reduced. The right ventricular size is normal.  3. No left atrial/left atrial appendage thrombus was detected.  4. The mitral valve is degenerative. Trivial mitral valve regurgitation. No evidence of mitral  stenosis.  5. Partial fusion of the left and right coronary cusps. The aortic valve is tricuspid. There is moderate calcification of the aortic valve. There is moderate thickening of the aortic valve. Aortic valve regurgitation is not visualized. Moderate aortic valve stenosis. Aortic valve area, by VTI measures 1.30 cm. Aortic valve mean gradient measures 14.0 mmHg. Aortic valve Vmax measures 2.74 m/s.  6. The inferior vena cava is normal  in size with greater than 50% respiratory variability, suggesting right atrial pressure of 3 mmHg. Conclusion(s)/Recommendation(s): No evidence of vegetation/infective endocarditis on this transesophageael echocardiogram. FINDINGS  Left Ventricle: Left ventricular ejection fraction, by estimation, is 25 to 30%. The left ventricle has severely decreased function. The left ventricle demonstrates global hypokinesis. The left ventricular internal cavity size was normal in size. There is no left ventricular hypertrophy. Right Ventricle: The right ventricular size is normal. No increase in right ventricular wall thickness. Right ventricular systolic function is mildly reduced. Left Atrium: Left atrial size was normal in size. No left atrial/left atrial appendage thrombus was detected. Right Atrium: Right atrial size was normal in size. Pericardium: There is no evidence of pericardial effusion. Mitral Valve: The mitral valve is degenerative in appearance. Mild mitral annular calcification. Trivial mitral valve regurgitation. No evidence of mitral valve stenosis. Tricuspid Valve: The tricuspid valve is normal in structure. Tricuspid valve regurgitation is mild . No evidence of tricuspid stenosis. Aortic Valve: Partial fusion of the left and right coronary cusps. The aortic valve is tricuspid. There is moderate calcification of the aortic valve. There is moderate thickening of the aortic valve. Aortic valve regurgitation is not visualized. Moderate aortic stenosis is present. Aortic valve mean gradient measures 14.0 mmHg. Aortic valve peak gradient measures 30.0 mmHg. Aortic valve area, by VTI measures 1.30 cm. Pulmonic Valve: The pulmonic valve was normal in structure. Pulmonic valve regurgitation is not visualized. No evidence of pulmonic stenosis. Aorta: The aortic root and ascending aorta are structurally normal, with no evidence of dilitation. There is atheroma plaque involving the descending aorta. Venous: The  inferior vena cava is normal in size with greater than 50% respiratory variability, suggesting right atrial pressure of 3 mmHg. IAS/Shunts: No atrial level shunt detected by color flow Doppler. Additional Comments: Spectral Doppler performed. LEFT VENTRICLE PLAX 2D LVOT diam:     2.30 cm LV SV:         66 LV SV Index:   35 LVOT Area:     4.15 cm  AORTIC VALVE AV Area (Vmax):    1.34 cm AV Area (Vmean):   1.46 cm AV Area (VTI):     1.30 cm AV Vmax:           274.00 cm/s AV Vmean:          171.000 cm/s AV VTI:            0.513 m AV Peak Grad:      30.0 mmHg AV Mean Grad:      14.0 mmHg LVOT Vmax:         88.40 cm/s LVOT Vmean:        60.200 cm/s LVOT VTI:          0.160 m LVOT/AV VTI ratio: 0.31  AORTA Ao Root diam: 3.50 cm Ao Asc diam:  3.60 cm TRICUSPID VALVE TR Peak grad:   48.4 mmHg TR Vmax:        348.00 cm/s  SHUNTS Systemic VTI:  0.16 m Systemic Diam: 2.30 cm Annabella Scarce MD Electronically  signed by Annabella Scarce MD Signature Date/Time: 12/16/2023/1:54:31 PM    Final    EP STUDY Result Date: 12/16/2023 See surgical note for result.   aspirin  EC  81 mg Oral Daily   Chlorhexidine  Gluconate Cloth  6 each Topical Q0600   [START ON 12/18/2023] darbepoetin (ARANESP ) injection - DIALYSIS  100 mcg Subcutaneous Q Wed-1800   heparin   5,000 Units Subcutaneous Q8H   lidocaine   1 patch Transdermal Q24H   lisinopril   20 mg Oral Daily   metoprolol  succinate  25 mg Oral Daily   pravastatin   40 mg Oral QHS   sevelamer  carbonate  4,800 mg Oral TID WC   sodium chloride  flush  3 mL Intravenous Q12H    BMET    Component Value Date/Time   NA 130 (L) 12/16/2023 1421   NA 136 11/26/2013 0914   K 4.7 12/16/2023 1421   CL 94 (L) 12/16/2023 0919   CO2 25 12/15/2023 1300   GLUCOSE 100 (H) 12/16/2023 0919   BUN 23 12/16/2023 0919   BUN 66 (A) 05/25/2015 0000   BUN 66 (A) 05/25/2015 0000   CREATININE 7.95 (H) 12/16/2023 1554   CREATININE 8.54 (HH) 01/31/2023 1414   CREATININE 10.02 (H) 01/29/2023  1130   CALCIUM  9.2 12/15/2023 1300   CALCIUM  8.8 06/08/2015 0000   CALCIUM  7.9 (L) 01/19/2010 1016   GFRNONAA 7 (L) 12/16/2023 1554   GFRNONAA 7 (L) 01/31/2023 1414   GFRAA 4 (L) 07/15/2019 0704   CBC    Component Value Date/Time   WBC 6.2 12/17/2023 0457   RBC 2.77 (L) 12/17/2023 0457   HGB 8.7 (L) 12/17/2023 0457   HGB 10.4 (L) 01/31/2023 1414   HCT 25.9 (L) 12/17/2023 0457   PLT 136 (L) 12/17/2023 0457   PLT 100 (L) 01/31/2023 1414   MCV 93.5 12/17/2023 0457   MCH 31.4 12/17/2023 0457   MCHC 33.6 12/17/2023 0457   RDW 16.3 (H) 12/17/2023 0457   RDW 16.7 (H) 11/26/2013 0914   LYMPHSABS 0.6 (L) 12/11/2023 0352   LYMPHSABS 1.3 11/26/2013 0914   MONOABS 0.5 12/11/2023 0352   EOSABS 0.1 12/11/2023 0352   EOSABS 0.2 11/26/2013 0914   BASOSABS 0.1 12/11/2023 0352   BASOSABS 0.1 11/26/2013 0914    Dialysis Orders: [facility] MWF  4h   B400   81.3kg   LFA AVF   Heparin  2500 Last OP HD 11/17, post wt 82kg Mircera 75 mcg q 4wks, last 11/12, due 12/10   Assessment/Plan: NSTEMI/Elevated troponin. IV Heparin /nitroglycerin   -  troponin trending up. S/p TEE and right and left heart cath yesterday with stable CAD and mildly elevated LVEDP and PCWP. HFrEF 25-30%.  Volume overload on arrival. Optimize volume with HD.  Had extra HD Tues. Now below EDW but likely has lost some weight. UF as tolerated. Strep mitis bacteremia. Antibiotics per primary. ID following. S/p TEE yesterday with no thrombus or mass.  ESRD: on HD MWF. Due to Thanksgiving Holiday will have dialysis on Sunday, Tuesday and Friday this week.  HD today HTN. BP was soft after dialysis/surgery. May need to titrate meds down, will CTM for now.  Anemia. Hgb 8.5, s/p 1 unit pRBC Transfuse prbcs.  ESA recently dosed. Will increase frequency to q 2 wks - next ESA due 11/26.  2HPTH. Ca/Phos ok. Continue home meds. Asked to switch to Renvela  pills to avoid increased fluids with taking powder. Equivalent dose - 6 pills TID.  Aortic  stenosis- TCTS consulted. Planning for outpatient TAVR  which is now on hold d/t bacteremia. Nutrition - Renal diet w/fluid restrictions. Disposition - stable for discharge after HD from renal standpoint.   Fairy RONAL Sellar, MD [facility]

## 2023-12-17 NOTE — Plan of Care (Signed)

## 2023-12-17 NOTE — Progress Notes (Signed)
   12/17/23 1638  Vitals  Temp 98.7 F (37.1 C)  Pulse Rate 82  Resp 19  BP (!) 144/60  SpO2 96 %  O2 Device Room Air  Weight 81.6 kg  Type of Weight Post-Dialysis  Oxygen  Therapy  Patient Activity (if Appropriate) In bed  Pulse Oximetry Type Continuous  Oximetry Probe Site Changed No  Post Treatment  Dialyzer Clearance Lightly streaked  Hemodialysis Intake (mL) 0 mL  Liters Processed 79.7  Fluid Removed (mL) 1800 mL  Tolerated HD Treatment Yes  AVG/AVF Arterial Site Held (minutes) 5 minutes  AVG/AVF Venous Site Held (minutes) 5 minutes

## 2023-12-17 NOTE — Progress Notes (Addendum)
 Contacted by pharmacy regarding IV abx needed with HD possibly at d/c to out-pt unit. Contacted Geron[facility] at this time and informed that pt will need Cefazolin  2g w HD Tuesday and Friday of this week only. Confirmed with clinic that they have this available at unit and can give at out-pt HD if d/c before Friday. PA and pharmacy informed, will continue to assist as needed.   Sherria Riemann Dialysis Navigator [phone]

## 2023-12-17 NOTE — Evaluation (Signed)
 Physical Therapy Evaluation Patient Details Name: Kirk [name] MRN: [id number] DOB: 1960-04-05 Today's Date: 12/17/2023  History of Present Illness  Pt is a 63 y.o. male who presented 12/09/23 due to chest pain while at HD and was admitted due to possible NSTEMI.  Pt also noted to have sepsis secondary to gram-positive bacteremia. Pt s/p TEE with reduce EF 25-30 % 11/24.  S/p R/L heart cath 11/24.  PMH: hfref, copd, esrd on mwf dialysis, htn, osa on cpap, a fib not anticoagulated.  Clinical Impression   Pt presents with min weakness, impaired balance with history of falls though does not require AD on eval, impaired activity tolerance vs anticipated baseline, and chest pressure-type pain with exertion (subsides with rest). Pt to benefit from acute PT to address deficits. Pt ambulated good hallway distance without AD and supervision for safety, VSS with HR 70s-80s and SPO2 98-100% whenever checked on RA. Pt endorsing midsternal chest pressure with gait, subsides with rest, RN notified of this. Recommending HHPT and support of family at d/c. Pt declined practicing stairs today, would benefit from doing stairs prior to d/c. PT to progress mobility as tolerated, and will continue to follow acutely.          If plan is discharge home, recommend the following: A little help with walking and/or transfers;A little help with bathing/dressing/bathroom   Can travel by private vehicle        Equipment Recommendations None recommended by PT  Recommendations for Other Services       Functional Status Assessment Patient has had a recent decline in their functional status and demonstrates the ability to make significant improvements in function in a reasonable and predictable amount of time.     Precautions / Restrictions Precautions Precautions: Fall Recall of Precautions/Restrictions: Intact Precaution/Restrictions Comments: history of falls Restrictions Weight Bearing Restrictions Per Provider  Order: No      Mobility  Bed Mobility Overal bed mobility: Modified Independent Bed Mobility: Sit to Supine, Supine to Sit     Supine to sit: Modified independent (Device/Increase time) Sit to supine: Modified independent (Device/Increase time)   General bed mobility comments: getting OOB upon PT arrival to room    Transfers Overall transfer level: Needs assistance   Transfers: Sit to/from Stand Sit to Stand: Supervision           General transfer comment: for safety, no physical assist    Ambulation/Gait Ambulation/Gait assistance: Supervision Gait Distance (Feet): 300 Feet Assistive device: None Gait Pattern/deviations: Step-through pattern, Decreased stride length, Trunk flexed Gait velocity: decr     General Gait Details: for safety, pt ambulating without AD and states he feels close to baseline except for exertional chest pressure. HR 70s-80s throughout, standing rest breaks improved pt symptoms. standing rest breaks x2  Stairs            Wheelchair Mobility     Tilt Bed    Modified Rankin (Stroke Patients Only)       Balance Overall balance assessment: Needs assistance Sitting-balance support: Feet supported Sitting balance-Leahy Scale: Good   Postural control: Posterior lean Standing balance support: No upper extremity supported, Single extremity supported Standing balance-Leahy Scale: Fair Standing balance comment: pt swaying when standing                             Pertinent Vitals/Pain Pain Assessment Pain Assessment: Faces Faces Pain Scale: Hurts even more Pain Location: back (chronic), chest pressure with  exertion and central (RN notified, improved with rest) Pain Descriptors / Indicators: Discomfort, Pressure Pain Intervention(s): Limited activity within patient's tolerance, Monitored during session, Repositioned    Home Living Family/patient expects to be discharged to:: Private residence Living Arrangements:  Spouse/significant other Available Help at Discharge: Family;Available 24 hours/day Type of Home: House Home Access: Stairs to enter Entrance Stairs-Rails: Left Entrance Stairs-Number of Steps: 12   Home Layout: One level Home Equipment: BSC/3in1;Shower seat;Grab bars - toilet;Rolling Walker (2 wheels);Cane - single point Additional Comments: Pt has a dog. Pt's wife recently was dc from the hospital and has not left to go home as they assist each other at baseline. Pt's daughter can assist some but works.    Prior Function Prior Level of Function : Independent/Modified Independent             Mobility Comments: No AD ADLs Comments: Pt and wife assist each other     Extremity/Trunk Assessment   Upper Extremity Assessment Upper Extremity Assessment: Defer to OT evaluation    Lower Extremity Assessment Lower Extremity Assessment: Generalized weakness    Cervical / Trunk Assessment Cervical / Trunk Assessment: Kyphotic  Communication   Communication Communication: Impaired Factors Affecting Communication: Hearing impaired    Cognition Arousal: Alert Behavior During Therapy: WFL for tasks assessed/performed   PT - Cognitive impairments: Problem solving, Safety/Judgement                         Following commands: Intact       Cueing Cueing Techniques: Verbal cues     General Comments      Exercises     Assessment/Plan    PT Assessment Patient needs continued PT services  PT Problem List Decreased strength;Decreased balance;Decreased activity tolerance;Decreased coordination;Decreased safety awareness       PT Treatment Interventions DME instruction;Therapeutic activities;Therapeutic exercise;Patient/family education;Gait training;Stair training;Balance training;Functional mobility training;Neuromuscular re-education    PT Goals (Current goals can be found in the Care Plan section)  Acute Rehab PT Goals PT Goal Formulation: With patient Time  For Goal Achievement: 12/31/23 Potential to Achieve Goals: Good    Frequency Min 2X/week     Co-evaluation               AM-PAC PT 6 Clicks Mobility  Outcome Measure Help needed turning from your back to your side while in a flat bed without using bedrails?: A Little Help needed moving from lying on your back to sitting on the side of a flat bed without using bedrails?: A Little Help needed moving to and from a bed to a chair (including a wheelchair)?: A Little Help needed standing up from a chair using your arms (e.g., wheelchair or bedside chair)?: A Little Help needed to walk in hospital room?: A Little Help needed climbing 3-5 steps with a railing? : A Little 6 Click Score: 18    End of Session Equipment Utilized During Treatment: Gait belt Activity Tolerance: Patient tolerated treatment well Patient left: with call bell/phone within reach;in bed;with family/visitor present Nurse Communication: Mobility status PT Visit Diagnosis: Other abnormalities of gait and mobility (R26.89);Muscle weakness (generalized) (M62.81)    Time: 8878-8865 PT Time Calculation (min) (ACUTE ONLY): 13 min   Charges:   PT Evaluation $PT Eval Low Complexity: 1 Low   PT General Charges $$ ACUTE PT VISIT: 1 Visit         Johana RAMAN, PT DPT Acute Rehabilitation Services Secure Chat Preferred  Office [phone]  Gotti Alwin E Stroup 12/17/2023, 12:57 PM

## 2023-12-17 NOTE — Progress Notes (Signed)
 Progress Note  Patient Name: Kirk [name] Date of Encounter: 12/17/2023 [facility] Cardiologist: Vinie [name], MD  Structural Heart: Dr. Lurena Red  Interval Summary   Feeling better today.  Has some right sided chest discomfort today.  Vital Signs Vitals:   12/16/23 2318 12/17/23 0228 12/17/23 0504 12/17/23 0805  BP: (!) 157/70  (!) 150/72 (!) 150/64  Pulse: (!) 55  76 76  Resp:    20  Temp: 98.6 F (37 C)   97.8 F (36.6 C)  TempSrc: Oral   Oral  SpO2: 98%  100% 100%  Weight:  80.5 kg    Height:        Intake/Output Summary (Last 24 hours) at 12/17/2023 1105 Last data filed at 12/17/2023 0600 Gross per 24 hour  Intake 1187.69 ml  Output 0 ml  Net 1187.69 ml      12/17/2023    2:28 AM 12/16/2023    3:25 AM 12/15/2023    4:43 PM  Last 3 Weights  Weight (lbs) 177 lb 8 oz 176 lb 14.4 oz 176 lb 5.9 oz  Weight (kg) 80.513 kg 80.241 kg 80 kg      Telemetry/ECG  SR - Personally Reviewed  Physical Exam  GEN: No acute distress.   Neck: No JVD-radiated carotid AAS murmur Cardiac: RRR with normal S1 and soft S2.  Harsh honking 4/6 SEM at RUSB radiating to carotids. Respiratory: Nonlabored, expiratory wheeze. GI: Soft, nontender, non-distended  MS: No edema  Assessment & Plan   63 y.o. 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    Type II MI-Demand Ischemia related to Aortic Stenosis and Sepsis -- Initially chest pain was  thought to be secondary to hypotension during dialysis in the setting of aortic stenosis.  EKG showed ST depressions though somewhat similar to prior tracings. -- hsTn peaked at 5953, was treated with IV heparin  for 48 hours -- Underwent cardiac catheterization 11/24 with minimal CAD, stable from prior catheterization with elevated troponin felt to be secondary to demand ischemia in the setting of aortic stenosis, ESRD and bacteremia -- Recommendations to avoid nitrates, remains on ASA 81mg  daily   He still has chest pain off-and-on which ranges between the right side of the chest and the left side of his chest.  Not necessarily exertional in nature.  Somewhat atypical and I am not sure that this is truly angina.  There was question about possible microvascular ischemia.  In which case potentially amlodipine  would be a reasonable option over Imdur .  Aortic stenosis -- Known to have moderate low-flow low gradient aortic stenosis by echo and aortic valve calcium  score on CT around 1200. -- Mean aortic valve gradient estimated at 23 mmHg suggesting moderate AS with LVEDP of 18 mmHg however, cannot exclude low-flow low gradient. Will ask the Structural Heart Team to opine on follow-up which has clearly been delayed by his bacteremia and hospitalization..    Strep mitis bacteremia --TEE 11/24, aortic valve trileaflet, calcified with partial fusion of the left and right coronary cusps,  moderate aortic stenosis, trivial TR no evidence of endocarditis  Acute HFrEF NICM -- Echo 11/18 with decline in LVEF to 25 to 30%, global hypokinesis, low normal RV -- Volume management per nephrology -- GDMT: has had some low BPs but overall stable. Will DC lisinopril , resume home hydralazine  50mg  TID and increase Toprol  to 37.5mg  daily ; with the exception of the combination of nitrate plus hydralazine  for afterload reduction, in the absence of macrovascular CAD, no real indication for nitrate.  ESRD on HD -- per  nephrology    For questions or updates, please contact [facility] Please consult [email or web address] for contact info under    Signed, Manuelita Rummer, NP     ATTENDING ATTESTATION  I have seen, examined and evaluated the patient this morning on rounds along with Manuelita Rummer, NP.  After reviewing all the available data and chart, we discussed the patients laboratory, study & physical findings as well as symptoms in detail.  I agree with her findings, examination as well as impression recommendations as per our discussion.    Attending adjustments noted in italics.   He had again had a cardiac catheterization showing minimal CAD and relatively stable right heart pressures.  Thankfully TEE was also reassuring with no evidence of endocarditis.  At this point his TAVR evaluation has been delayed.  Have asked for the structural heart team coordinator to comment on plans for follow-up.  We will continue to monitor while in the hospital and be available for assistance prior to discharge, but will not round daily.     Alm MICAEL Clay, MD, MS Alm Clay, M.D., M.S. Interventional Cardiologist  [facility] Pager # [phone]

## 2023-12-17 NOTE — Progress Notes (Addendum)
 Pt. Came in on bed, assisted by porter, awake and oriented. Consent signed and on file. Started tx with no complaints  UF goal: Tx duration: 3.5 hours  Access used: Left AVF Access issue: None  Tx completed and tolerated. Pressure dressing applied. Endorsed to floor nurse. Transported to room.  Kirk [name] Rubi B. Sanaya Gwilliam, RN [facility]

## 2023-12-17 NOTE — Progress Notes (Addendum)
  [facility]   MULTIDISCIPLINARY HEART VALVE TEAM  Patient discussed in valve team meeting this morning. Overall, visual appearance, aortic valve calcium  scoring ~1200, mean gradient ~20 mm hg and DVI 0.3, AVA 1.1 c/w moderate to severe LFLG AS. Ongoing chest pain and NSTEMI not felt to be related to aortic stenosis. Given recent positive blood cultures, he is not a candidate for TAVR until he has completed at least 6 weeks of abx and has negative follow up blood cultures. We will see him back in the office in 8 weeks for a follow up discussion about aortic stenosis and possible candidacy for TAVR. Discussed with pt who understands and is agreeable w/ plan.   Lamarr Hummer PA-C  [facility]  Pager [phone]

## 2023-12-17 NOTE — Plan of Care (Signed)

## 2023-12-17 NOTE — Evaluation (Signed)
 Occupational Therapy Evaluation Patient Details Name: Kirk [name] MRN: [id number] DOB: [birth year] Today's Date: 12/17/2023   History of Present Illness   Pt is a 63 y.o. male who presented 12/09/23 due to chest pain while at HD and was admitted due to possible NSTEMI.  Pt also noted to have sepsis secondary to gram-positive bacteremia. Pt s/p TEE with reduce EF 25-30 % 11/24.  S/p R/L heart cath 11/24.  PMH: hfref, copd, esrd on [facility]wf dialysis, htn, osa on cpap, a fib not anticoagulated.     Clinical Impressions Pt reported at PLOF they live with their wife (who recently was admitted to the hospital) and daughter can only assist PRN as they work. He requires to ascend 12 steps to enter the home and assists wife with ADLS.  He reports about 1 fall a month recently. At this time evaluation was limited as when pt started to ambulate would postural sway and could not focus looking at this reporting therapist and reported increase in pain. Nursing was made aware.  Recommendation HH vs SNF depending on progress. Acute Occupational Therapy to follow.  BP: Sitting at EOB: 154/101 (109) Post standing: 148/75 (94) Post ambulation: 168/67 (96)    If plan is discharge home, recommend the following:   A little help with walking and/or transfers;A little help with bathing/dressing/bathroom;Assistance with cooking/housework;Assist for transportation;Help with stairs or ramp for entrance     Functional Status Assessment   Patient has had a recent decline in their functional status and demonstrates the ability to make significant improvements in function in a reasonable and predictable amount of time.     Equipment Recommendations   None recommended by OT     Recommendations for Other Services         Precautions/Restrictions   Precautions Precautions: Fall Recall of Precautions/Restrictions: Intact Precaution/Restrictions Comments: Pt reported they are falling 1 x month      Mobility Bed Mobility Overal bed mobility: Needs Assistance Bed Mobility: Sit to Supine       Sit to supine: Modified independent (Device/Increase time)   General bed mobility comments: pt presented at EOB    Transfers Overall transfer level: Needs assistance   Transfers: Sit to/from Stand Sit to Stand: Contact guard assist                  Balance Overall balance assessment: Needs assistance Sitting-balance support: Feet supported Sitting balance-Leahy Scale: Good   Postural control: Posterior lean Standing balance support: No upper extremity supported, Single extremity supported Standing balance-Leahy Scale: Fair Standing balance comment: pt swaying when standing                           ADL either performed or assessed with clinical judgement   ADL Overall ADL's : Needs assistance/impaired Eating/Feeding: Independent;Sitting   Grooming: Contact guard assist;Standing   Upper Body Bathing: Set up;Sitting   Lower Body Bathing: Contact guard assist;Sit to/from stand   Upper Body Dressing : Set up;Sitting   Lower Body Dressing: Contact guard assist;Sit to/from stand   Toilet Transfer: Contact guard assist           Functional mobility during ADLs: Contact guard assist;Cueing for safety;Cueing for sequencing       Vision         Perception         Praxis         Pertinent Vitals/Pain Pain Assessment Pain Assessment: 0-10 Pain Score: 6  Pain Location: chest/back. per pt reported same pain as the day prior Pain Descriptors / Indicators: Discomfort Pain Intervention(s): Limited activity within patient's tolerance, Monitored during session     Extremity/Trunk Assessment Upper Extremity Assessment Upper Extremity Assessment: Generalized weakness (Pt at baseline chronic limited AROM of BUE to about 75 deg shoulder flexion due to arthritis.)       Cervical / Trunk Assessment Cervical / Trunk Assessment: Kyphotic    Communication Communication Communication: Impaired Factors Affecting Communication: Hearing impaired   Cognition Arousal: Alert Behavior During Therapy: WFL for tasks assessed/performed Cognition: No apparent impairments                               Following commands: Intact       Cueing  General Comments   Cueing Techniques: Verbal cues      Exercises     Shoulder Instructions      Home Living Family/patient expects to be discharged to:: Private residence Living Arrangements: Spouse/significant other Available Help at Discharge: Family;Available 24 hours/day Type of Home: House Home Access: Stairs to enter [employer] of Steps: 12 Entrance Stairs-Rails: Left Home Layout: One level     Bathroom Shower/Tub:  (claw tub)   Bathroom Toilet: Standard Bathroom Accessibility: Yes How Accessible: Accessible via walker Home Equipment: BSC/3in1;Shower seat;Grab bars - toilet;Rolling Walker (2 wheels);Cane - single point   Additional Comments: Pt has a dog. Pt's wife recently was dc from the hospital and has not left to go home as they assist each other at baseline. Pt's daughter can assist some but works.      Prior Functioning/Environment Prior Level of Function : Independent/Modified Independent             Mobility Comments: No AD ADLs Comments: Pt and wife assist each other    OT Problem List: Decreased strength;Decreased activity tolerance;Impaired balance (sitting and/or standing);Decreased safety awareness;Decreased knowledge of use of DME or AE;Pain   OT Treatment/Interventions: Self-care/ADL training;Therapeutic exercise;Therapeutic activities;Patient/family education;Balance training      OT Goals(Current goals can be found in the care plan section)   Acute Rehab OT Goals Patient Stated Goal: to go home OT Goal Formulation: With patient Time For Goal Achievement: 12/31/23 Potential to Achieve Goals: Fair   OT  Frequency:  Min 2X/week    Co-evaluation              AM-PAC OT 6 Clicks Daily Activity     Outcome Measure Help from another person eating meals?: None Help from another person taking care of personal grooming?: A Little Help from another person toileting, which includes using toliet, bedpan, or urinal?: A Little Help from another person bathing (including washing, rinsing, drying)?: A Little Help from another person to put on and taking off regular upper body clothing?: None Help from another person to put on and taking off regular lower body clothing?: A Little 6 Click Score: 20   End of Session Equipment Utilized During Treatment: Gait belt Nurse Communication: Mobility status  Activity Tolerance: Patient limited by pain Patient left: in bed;with call bell/phone within reach;with family/visitor present  OT Visit Diagnosis: Unsteadiness on feet (R26.81);Other abnormalities of gait and mobility (R26.89);Muscle weakness (generalized) (M62.81);Pain Pain - part of body:  (back, chest)                Time: 9069-9047 OT Time Calculation (min): 22 min Charges:  OT General Charges $OT Visit: 1 Visit OT  Evaluation $OT Eval Low Complexity: 1 Low  Warrick POUR OTR/L  Acute Rehab Services  [phone] office number   Warrick Berber 12/17/2023, 10:06 AM

## 2023-12-17 NOTE — Care Management Important Message (Signed)
 Important Message  Patient Details  Name: Kirk [name] MRN: [id number] Date of Birth: 03-24-1960   Important Message Given:  Yes - Medicare IM     [name] 12/17/2023, 9:16 AM

## 2023-12-17 NOTE — Progress Notes (Signed)
 [facility]  Date of Admission:  12/09/2023      Abx: 11/18 - c ceftriaxone     A/p Strep mitis bsi Esrd As Sepsis     Sepsis resolved Strep mitis and esrd concerning for endocarditis, especially with as    ----------- 11/25 id assessment Tee negative for vegetation Patient without clinical concern of active infection Repeat bcx negative  -maintain standard isolation precaution -change to cefazolin  and finish total 10 days abx (can do with dialysis) -maintain standard isolation precaution -id will sign off -discussed with primary team  Principal Problem:   NSTEMI (non-ST elevated myocardial infarction) (HCC) Active Problems:   Chest pain   Streptococcal bacteremia   Bacteremia   Allergies  Allergen Reactions   Codeine Nausea And Vomiting   Lortab [Hydrocodone -Acetaminophen ] Nausea And Vomiting    Scheduled Meds:  aspirin  EC  81 mg Oral Daily   Chlorhexidine  Gluconate Cloth  6 each Topical Q0600   [START ON 12/18/2023] darbepoetin (ARANESP ) injection - DIALYSIS  100 mcg Subcutaneous Q Wed-1800   heparin   5,000 Units Subcutaneous Q8H   hydrALAZINE   50 mg Oral Q8H   lidocaine   1 patch Transdermal Q24H   [START ON 12/18/2023] metoprolol  succinate  37.5 mg Oral Daily   pravastatin   40 mg Oral QHS   sevelamer  carbonate  4,800 mg Oral TID WC   sodium chloride  flush  3 mL Intravenous Q12H   Continuous Infusions:  sodium chloride       ceFAZolin  (ANCEF ) IV     PRN Meds:.sodium chloride , acetaminophen , calcium  carbonate, guaiFENesin , melatonin, oxyCODONE , polyethylene glycol, prochlorperazine , sodium chloride  flush   SUBJECTIVE: No complaint Tee negative No fever, chill, decreased appetite, malaise, focal pain   Review of Systems: ROS All other ROS was negative, except mentioned above     OBJECTIVE: Vitals:   12/17/23 1303 12/17/23 1330 12/17/23 1400 12/17/23 1430  BP: (!) 155/73 (!) 151/75 (!) 173/76 (!)  158/64  Pulse: 74 74 79 73  Resp: (!) 22 20 11  (!) 23  Temp:      TempSrc:      SpO2: 100% 100% 95% 99%  Weight:      Height:       Body mass index is 25.97 kg/m.  Physical Exam  General/constitutional: no distress, pleasant HEENT: Normocephalic, PER, Conj Clear, EOMI, Oropharynx clear Neck supple CV: rrr no mrg Lungs: clear to auscultation, normal respiratory effort Abd: Soft, Nontender Ext: no edema Skin: No Rash Neuro: nonfocal MSK: no peripheral joint swelling/tenderness/warmth; back spines nontender    Lab Results Lab Results  Component Value Date   WBC 5.5 12/17/2023   HGB 8.1 (L) 12/17/2023   HCT 24.8 (L) 12/17/2023   MCV 95.0 12/17/2023   PLT 119 (L) 12/17/2023    Lab Results  Component Value Date   CREATININE 7.95 (H) 12/16/2023   BUN 23 12/16/2023   NA 130 (L) 12/16/2023   K 4.7 12/16/2023   CL 94 (L) 12/16/2023   CO2 25 12/15/2023    Lab Results  Component Value Date   ALT 16 11/15/2023   AST 18 11/15/2023   ALKPHOS 70 11/15/2023   BILITOT 1.5 (H) 11/15/2023      Microbiology: Recent Results (from the past 240 hours)  Resp panel by RT-PCR (RSV, Flu A&B, Covid) Anterior Nasal Swab     Status: None   Collection Time: 12/09/23 10:21 PM   Specimen: Anterior Nasal Swab  Result Value Ref  Range Status   SARS Coronavirus 2 by RT PCR NEGATIVE NEGATIVE Final   Influenza A by PCR NEGATIVE NEGATIVE Final   Influenza B by PCR NEGATIVE NEGATIVE Final    Comment: (NOTE) The Xpert Xpress SARS-CoV-2/FLU/RSV plus assay is intended as an aid in the diagnosis of influenza from Nasopharyngeal swab specimens and should not be used as a sole basis for treatment. Nasal washings and aspirates are unacceptable for Xpert Xpress SARS-CoV-2/FLU/RSV testing.  Fact Sheet for Patients: bloggercourse.com  Fact Sheet for Healthcare Providers: seriousbroker.it  This test is not yet approved or cleared by the [place]  [place] FDA and has been authorized for detection and/or diagnosis of SARS-CoV-2 by FDA under an Emergency Use Authorization (EUA). This EUA will remain in effect (meaning this test can be used) for the duration of the COVID-19 declaration under Section 564(b)(1) of the Act, 21 U.S.C. section 360bbb-3(b)(1), unless the authorization is terminated or revoked.     Resp Syncytial Virus by PCR NEGATIVE NEGATIVE Final    Comment: (NOTE) Fact Sheet for Patients: bloggercourse.com  Fact Sheet for Healthcare Providers: seriousbroker.it  This test is not yet approved or cleared by the [place]  FDA and has been authorized for detection and/or diagnosis of SARS-CoV-2 by FDA under an Emergency Use Authorization (EUA). This EUA will remain in effect (meaning this test can be used) for the duration of the COVID-19 declaration under Section 564(b)(1) of the Act, 21 U.S.C. section 360bbb-3(b)(1), unless the authorization is terminated or revoked.  Performed at [facility], [address], [place], [place] [address]   Blood Culture (routine x 2)     Status: Abnormal   Collection Time: 12/09/23 10:21 PM   Specimen: BLOOD RIGHT ARM  Result Value Ref Range Status   Specimen Description BLOOD RIGHT ARM  Final   Special Requests   Final    BOTTLES DRAWN AEROBIC AND ANAEROBIC Blood Culture adequate volume   Culture  Setup Time   Final    GRAM POSITIVE COCCI IN CHAINS IN BOTH AEROBIC AND ANAEROBIC BOTTLES CRITICAL RESULT CALLED TO, READ BACK BY AND VERIFIED WITH: PHARMD J.FRENS AT 1400 ON 12/10/2023 BY T.SAAD.  Performed at [facility], [address] [place], [place] [address]    Culture STREPTOCOCCUS MITIS/ORALIS (A)  Final   Report Status 12/12/2023 FINAL  Final   Organism ID, Bacteria STREPTOCOCCUS MITIS/ORALIS  Final      Susceptibility   Streptococcus mitis/oralis - MIC*    PENICILLIN <=0.06 SENSITIVE Sensitive      CEFTRIAXONE  <=0.12 SENSITIVE Sensitive     LEVOFLOXACIN  1 SENSITIVE Sensitive     VANCOMYCIN  0.5 SENSITIVE Sensitive     * STREPTOCOCCUS MITIS/ORALIS  Blood Culture (routine x 2)     Status: Abnormal   Collection Time: 12/09/23 10:21 PM   Specimen: BLOOD RIGHT ARM  Result Value Ref Range Status   Specimen Description BLOOD RIGHT ARM  Final   Special Requests   Final    BOTTLES DRAWN AEROBIC AND ANAEROBIC Blood Culture adequate volume   Culture  Setup Time   Final    GRAM POSITIVE COCCI IN CHAINS IN BOTH AEROBIC AND ANAEROBIC BOTTLES CRITICAL VALUE NOTED.  VALUE IS CONSISTENT WITH PREVIOUSLY REPORTED AND CALLED VALUE.    Culture (A)  Final    STREPTOCOCCUS MITIS/ORALIS SUSCEPTIBILITIES PERFORMED ON PREVIOUS CULTURE WITHIN THE LAST 5 DAYS. Performed at [facility], [address], [place], [place] [address]    Report Status 12/12/2023 FINAL  Final  Blood Culture ID Panel (Reflexed)     Status: Abnormal   Collection Time: 12/09/23 10:21 PM  Result Value Ref Range Status   Enterococcus faecalis NOT DETECTED NOT DETECTED Final   Enterococcus Faecium NOT DETECTED NOT DETECTED Final   Listeria monocytogenes NOT DETECTED NOT DETECTED Final   Staphylococcus species NOT DETECTED NOT DETECTED Final   Staphylococcus aureus (BCID) NOT DETECTED NOT DETECTED Final   Staphylococcus epidermidis NOT DETECTED NOT DETECTED Final   Staphylococcus lugdunensis NOT DETECTED NOT DETECTED Final   Streptococcus species DETECTED (A) NOT DETECTED Final    Comment: Not Enterococcus species, Streptococcus agalactiae, Streptococcus pyogenes, or Streptococcus pneumoniae. CRITICAL RESULT CALLED TO, READ BACK BY AND VERIFIED WITH: PHARMD J.FRENS AT 1400 ON 12/10/2023 BY T.SAAD.     Streptococcus agalactiae NOT DETECTED NOT DETECTED Final   Streptococcus pneumoniae NOT DETECTED NOT DETECTED Final   Streptococcus pyogenes NOT DETECTED NOT DETECTED Final   A.calcoaceticus-baumannii NOT DETECTED NOT DETECTED  Final   Bacteroides fragilis NOT DETECTED NOT DETECTED Final   Enterobacterales NOT DETECTED NOT DETECTED Final   Enterobacter cloacae complex NOT DETECTED NOT DETECTED Final   Escherichia coli NOT DETECTED NOT DETECTED Final   Klebsiella aerogenes NOT DETECTED NOT DETECTED Final   Klebsiella oxytoca NOT DETECTED NOT DETECTED Final   Klebsiella pneumoniae NOT DETECTED NOT DETECTED Final   Proteus species NOT DETECTED NOT DETECTED Final   Salmonella species NOT DETECTED NOT DETECTED Final   Serratia marcescens NOT DETECTED NOT DETECTED Final   Haemophilus influenzae NOT DETECTED NOT DETECTED Final   Neisseria meningitidis NOT DETECTED NOT DETECTED Final   Pseudomonas aeruginosa NOT DETECTED NOT DETECTED Final   Stenotrophomonas maltophilia NOT DETECTED NOT DETECTED Final   Candida albicans NOT DETECTED NOT DETECTED Final   Candida auris NOT DETECTED NOT DETECTED Final   Candida glabrata NOT DETECTED NOT DETECTED Final   Candida krusei NOT DETECTED NOT DETECTED Final   Candida parapsilosis NOT DETECTED NOT DETECTED Final   Candida tropicalis NOT DETECTED NOT DETECTED Final   Cryptococcus neoformans/gattii NOT DETECTED NOT DETECTED Final    Comment: Performed at [facility], [address] [place], [place] [address]  Culture, blood (Routine X 2) w Reflex to ID Panel     Status: None   Collection Time: 12/12/23 11:04 AM   Specimen: BLOOD RIGHT ARM  Result Value Ref Range Status   Specimen Description BLOOD RIGHT ARM  Final   Special Requests   Final    BOTTLES DRAWN AEROBIC AND ANAEROBIC Blood Culture adequate volume   Culture   Final    NO GROWTH 5 DAYS Performed at [facility], [address], [place], [place] [address]    Report Status 12/17/2023 FINAL  Final  Culture, blood (Routine X 2) w Reflex to ID Panel     Status: None   Collection Time: 12/12/23 11:04 AM   Specimen: BLOOD RIGHT ARM  Result Value Ref Range Status   Specimen Description BLOOD RIGHT ARM   Final   Special Requests   Final    BOTTLES DRAWN AEROBIC AND ANAEROBIC Blood Culture adequate volume   Culture   Final    NO GROWTH 5 DAYS Performed at [facility], [address], [place], [place] [address]    Report Status 12/17/2023 FINAL  Final     Serology:   Imaging: If present, new imagings (plain films, ct scans, and mri) have been personally visualized and interpreted; radiology reports  have been reviewed. Decision making incorporated into the Impression / Recommendations.   Constance ONEIDA Passer, MD [facility] [phone] pager    12/17/2023, 2:46 PM

## 2023-12-17 NOTE — Progress Notes (Signed)
 PROGRESS NOTE    Kirk [name]  FMW:[id number] DOB: 11/16/1960 DOA: 12/09/2023 PCP: Leonarda Roxan [name], NP   Brief Narrative:  Kirk [name] is a 63 y.o. 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  He did complete 4 hours of hemodialysis.  His pain was, 10 out of 10 and associated with shortness of breath.  Worse when taking deep breath.  EMS was activated.   In the ER, he received full dose aspirin  324 mg x 1.  His chest pain persisted.  His twelve-lead EKG showed no evidence of acute ischemia.  High-sensitivity troponin 153 with delta, repeat 1098.  EDP discussed the case with cardiology recommended heparin  drip and admit for possible NSTEMI.    Assessment & Plan:   Principal Problem:   NSTEMI (non-ST elevated myocardial infarction) [facility]) Active Problems:   Chest pain   Streptococcal bacteremia   Bacteremia  History of severe low-flow low gradient aortic stenosis/elevated troponin/?ACS-NSTEMI: chest pain initially thought to be due to hypotension during HD in the setting of low-flow low gradient severe aortic stenosis with a recent unremarkable left heart catheterization (10/04/23), Also, patient recently had CT coronary about 3 weeks ago which showed diffuse aortic atherosclerosis. and a history of chronic HD related chest pain (noted on patient's primary cardiologist notes). Echocardiogram showed unchanged left ventricular function however is significantly more dilated compared to prior. ECG with ST depressions, however similar ST depressions noted on prior ECGs. Also anemic. Patient received heparin  and nitroglycerin  drip for approximately 48 hours which were discontinued on 12/11/2023 per cardiology's recommendation, although his chest  pain did not improve but then it did improve with lidocaine  patch however his troponin did significantly rise and plateaued which is not consistent with his prior chronically elevated troponin and therefore cardiology now opined that he may have ACS and for that reason, he was restarted on heparin  drip on 12/13/2023 with a plan to continue for another 48 hours and TEE was rescheduled for Monday, 12/16/2023.  On 12/14/2023, patient was noted to have exertional dyspnea and presyncopal episode with minimal activity.  Troponins were once again checked and they were downtrending. Underwent cardiac catheterization 11/24 with minimal CAD, stable from prior catheterization with elevated troponin felt to be secondary to demand ischemia in the setting of aortic stenosis, ESRD and bacteremia. Recommendations to avoid nitrates, remains on ASA 81mg  daily.  Cardiology team is going to reach out to EP team to comment on his TAVR.  Sepsis secondary to gram-positive bacteremia, not POA: Upon arrival, patient did not have any signs or symptoms of sepsis.  After admission, patient spiked fever with Tmax of 102.9 at around 4 PM on 12/10/2023 along with tachycardia and tachypnea and blood culture was positive with gram-positive cocci.  Patient started on Rocephin .  Last temperature spike of 100.9 at 7 PM 12/10/2023.  Blood culture now growing Streptococcus mitis/oralis which is pansensitive.  Structural cardiology team was considering TAVR on him outpatient but per them, due to bacteremia, they postponed it.  ID was consulted.  Patient was continued on Rocephin , patient eventually underwent TEE 12/16/2023, no vegetations found.  ID recommends cefazolin  with HD for 10 days.  ESRD on HD/volume overload: Upon arrival to ED, patient received IV fluid bolus and was continued on slow IV fluids.  Typically  receives dialysis on Monday Wednesday Friday schedule, due to volume overload, patient received dialysis on Tuesday and then on  Wednesday to put him back on the schedule.  Nephrology managing.  History of paroxysmal atrial fibrillation: On Toprol -XL 12.5.  Rate still elevated.  Not on any anticoagulation previously.  Will defer to cardiology.  Chronic combined systolic and diastolic congestive heart failure: Volume management by dialysis.has had some low BPs but overall stable.  Per cardiology, DC lisinopril , resume home hydralazine  50mg  TID and increase Toprol  to 37.5mg  daily ; with the exception of the combination of nitrate plus hydralazine  for afterload reduction, in the absence of macrovascular CAD, no real indication for nitrate.   Essential hypertension: Blood pressure remains elevated despite of Toprol -XL.  Will start him on lisinopril  20 mg.  Hyperlipidemia: Continue statin.  QTc prolongation: Upon admission, QTc was 542.  Optimize electrolytes and await QTc prolonging medications and monitor on telemetry.  Generalized weakness: Seen by OT who recommended SNF however PT recommended home health PT, he walked 300 feet with them.  Patient also prefers to go home.  DVT prophylaxis: heparin  injection 5,000 Units Start: 12/16/23 2200Heparin   Code Status: Full Code  Family Communication: Wife present at bedside.  Plan of care discussed with patient in length and he/she verbalized understanding and agreed with it.  Status is: Inpatient Remains inpatient appropriate because: Waiting for EP evaluation.  Estimated body mass index is 27.8 kg/m as calculated from the following:   Height as of this encounter: 5' 7 (1.702 m).   Weight as of this encounter: 80.5 kg.    Nutritional Assessment: Body mass index is 27.8 kg/m.SABRA Seen by dietician.  I agree with the assessment and plan as outlined below: Nutrition Status:        . Skin Assessment: I have examined the patient's skin and I agree with the wound assessment as performed by the wound care RN as outlined below:    Consultants:  Nephrology and  cardiology ID Procedures:  As above  Antimicrobials:  Anti-infectives (From admission, onward)    Start     Dose/Rate Route Frequency Ordered Stop   12/17/23 2000  ceFAZolin  (ANCEF ) IVPB 2g/100 mL premix        2 g 200 mL/hr over 30 Minutes Intravenous Once per day on Tuesday Friday 12/17/23 1011 12/24/23 1959   12/10/23 2200  cefTRIAXone  (ROCEPHIN ) 2 g in sodium chloride  0.9 % 100 mL IVPB  Status:  Discontinued        2 g 200 mL/hr over 30 Minutes Intravenous Every 24 hours 12/10/23 1519 12/17/23 1011   12/09/23 2200  vancomycin  (VANCOCIN ) IVPB 1000 mg/200 mL premix  Status:  Discontinued        1,000 mg 200 mL/hr over 60 Minutes Intravenous  Once 12/09/23 2151 12/09/23 2153   12/09/23 2200  ceFEPIme  (MAXIPIME ) 2 g in sodium chloride  0.9 % 100 mL IVPB        2 g 200 mL/hr over 30 Minutes Intravenous  Once 12/09/23 2151 12/09/23 2337   12/09/23 2200  metroNIDAZOLE  (FLAGYL ) IVPB 500 mg        500 mg 100 mL/hr over 60 Minutes Intravenous  Once 12/09/23 2151 12/09/23 2342   12/09/23 2200  vancomycin  (VANCOREADY) IVPB 1750 mg/350 mL        1,750 mg 175 mL/hr over 120 Minutes Intravenous  Once 12/09/23 2153 12/10/23 0142         Subjective: Patient seen and examined.  Still complains of  intermittent nonexertional chest pain.  No shortness of breath.  Objective: Vitals:   12/16/23 2318 12/17/23 0228 12/17/23 0504 12/17/23 0805  BP: (!) 157/70  (!) 150/72 (!) 150/64  Pulse: (!) 55  76 76  Resp:    20  Temp: 98.6 F (37 C)   97.8 F (36.6 C)  TempSrc: Oral   Oral  SpO2: 98%  100% 100%  Weight:  80.5 kg    Height:        Intake/Output Summary (Last 24 hours) at 12/17/2023 1230 Last data filed at 12/17/2023 0600 Gross per 24 hour  Intake 1187.69 ml  Output 0 ml  Net 1187.69 ml   Filed Weights   12/15/23 1643 12/16/23 0325 12/17/23 0228  Weight: 80 kg 80.2 kg 80.5 kg    Examination:  General exam: Appears calm and comfortable  Respiratory system: Clear to  auscultation. Respiratory effort normal. Cardiovascular system: S1 & S2 heard, RRR. No JVD, murmurs, rubs, gallops or clicks. No pedal edema. Gastrointestinal system: Abdomen is nondistended, soft and nontender. No organomegaly or masses felt. Normal bowel sounds heard. Central nervous system: Alert and oriented. No focal neurological deficits. Extremities: Symmetric 5 x 5 power. Skin: No rashes, lesions or ulcers.  Psychiatry: Judgement and insight appear normal. Mood & affect appropriate.   Data Reviewed: I have personally reviewed following labs and imaging studies  CBC: Recent Labs  Lab 12/10/23 1711 12/11/23 0352 12/11/23 1320 12/14/23 0650 12/15/23 0333 12/16/23 0401 12/16/23 0903 12/16/23 1415 12/16/23 1420 12/16/23 1421 12/16/23 1554 12/17/23 0457  WBC 5.8 4.1   < > 4.4 3.9* 5.1  --   --   --   --  4.4 6.2  NEUTROABS 4.9 2.9  --   --   --   --   --   --   --   --   --   --   HGB 7.4* 7.4*   < > 8.7* 8.5* 8.6*   < > 8.2* 8.2* 8.5* 8.5* 8.7*  HCT 23.1* 23.7*   < > 26.5* 25.8* 26.0*   < > 24.0* 24.0* 25.0* 26.2* 25.9*  MCV 97.5 99.6   < > 94.0 94.2 95.2  --   --   --   --  96.0 93.5  PLT 134* 134*   < > 159 139* 137*  --   --   --   --  116* 136*   < > = values in this interval not displayed.   Basic Metabolic Panel: Recent Labs  Lab 12/11/23 0352 12/11/23 1320 12/13/23 0412 12/15/23 1300 12/16/23 0903 12/16/23 0919 12/16/23 1415 12/16/23 1420 12/16/23 1421 12/16/23 1554  NA 134*  --  134* 133* 140 132* 131* 129* 130*  --   K 4.2  --  4.2 4.4 3.2* 4.6 4.8 4.5 4.7  --   CL 93*  --  94* 91* 107 94*  --   --   --   --   CO2 25  --  25 25  --   --   --   --   --   --   GLUCOSE 149*  --  114* 125* 72 100*  --   --   --   --   BUN 27*  --  37* 37* 15 23  --   --   --   --   CREATININE 5.90*   < > 7.82* 9.58* 5.20* 7.20*  --   --   --  7.95*  CALCIUM  8.6*  --  9.1 9.2  --   --   --   --   --   --   PHOS 4.6  --  6.7* 7.8*  --   --   --   --   --   --    < > =  values in this interval not displayed.   GFR: Estimated Creatinine Clearance: 9.9 mL/min (A) (by C-G formula based on SCr of 7.95 mg/dL (H)). Liver Function Tests: Recent Labs  Lab 12/11/23 0352 12/13/23 0412 12/15/23 1300  ALBUMIN  2.6* 2.4* 2.6*   No results for input(s): LIPASE, AMYLASE in the last 168 hours. No results for input(s): AMMONIA in the last 168 hours. Coagulation Profile: No results for input(s): INR, PROTIME in the last 168 hours.  Cardiac Enzymes: No results for input(s): CKTOTAL, CKMB, CKMBINDEX, TROPONINI in the last 168 hours. BNP (last 3 results) No results for input(s): PROBNP in the last 8760 hours. HbA1C: No results for input(s): HGBA1C in the last 72 hours. CBG: Recent Labs  Lab 12/12/23 0827 12/14/23 1236  GLUCAP 141* 130*   Lipid Profile: No results for input(s): CHOL, HDL, LDLCALC, TRIG, CHOLHDL, LDLDIRECT in the last 72 hours. Thyroid  Function Tests: No results for input(s): TSH, T4TOTAL, FREET4, T3FREE, THYROIDAB in the last 72 hours. Anemia Panel: No results for input(s): VITAMINB12, FOLATE, FERRITIN, TIBC, IRON, RETICCTPCT in the last 72 hours. Sepsis Labs: No results for input(s): PROCALCITON, LATICACIDVEN in the last 168 hours.   Recent Results (from the past 240 hours)  Resp panel by RT-PCR (RSV, Flu A&B, Covid) Anterior Nasal Swab     Status: None   Collection Time: 12/09/23 10:21 PM   Specimen: Anterior Nasal Swab  Result Value Ref Range Status   SARS Coronavirus 2 by RT PCR NEGATIVE NEGATIVE Final   Influenza A by PCR NEGATIVE NEGATIVE Final   Influenza B by PCR NEGATIVE NEGATIVE Final    Comment: (NOTE) The Xpert Xpress SARS-CoV-2/FLU/RSV plus assay is intended as an aid in the diagnosis of influenza from Nasopharyngeal swab specimens and should not be used as a sole basis for treatment. Nasal washings and aspirates are unacceptable for Xpert Xpress  SARS-CoV-2/FLU/RSV testing.  Fact Sheet for Patients: bloggercourse.com  Fact Sheet for Healthcare Providers: seriousbroker.it  This test is not yet approved or cleared by the [place]  FDA and has been authorized for detection and/or diagnosis of SARS-CoV-2 by FDA under an Emergency Use Authorization (EUA). This EUA will remain in effect (meaning this test can be used) for the duration of the COVID-19 declaration under Section 564(b)(1) of the Act, 21 U.S.C. section 360bbb-3(b)(1), unless the authorization is terminated or revoked.     Resp Syncytial Virus by PCR NEGATIVE NEGATIVE Final    Comment: (NOTE) Fact Sheet for Patients: bloggercourse.com  Fact Sheet for Healthcare Providers: seriousbroker.it  This test is not yet approved or cleared by the [place]  FDA and has been authorized for detection and/or diagnosis of SARS-CoV-2 by FDA under an Emergency Use Authorization (EUA). This EUA will remain in effect (meaning this test can be used) for the duration of the COVID-19 declaration under Section 564(b)(1) of the Act, 21 U.S.C. section 360bbb-3(b)(1), unless the authorization is terminated or revoked.  Performed at [facility], [address], [place], [place] [address]   Blood Culture (routine x 2)     Status: Abnormal   Collection Time: 12/09/23 10:21 PM   Specimen: BLOOD RIGHT ARM  Result Value  Ref Range Status   Specimen Description BLOOD RIGHT ARM  Final   Special Requests   Final    BOTTLES DRAWN AEROBIC AND ANAEROBIC Blood Culture adequate volume   Culture  Setup Time   Final    GRAM POSITIVE COCCI IN CHAINS IN BOTH AEROBIC AND ANAEROBIC BOTTLES CRITICAL RESULT CALLED TO, READ BACK BY AND VERIFIED WITH: PHARMD J.FRENS AT 1400 ON 12/10/2023 BY T.SAAD.  Performed at [facility], [address], [place], [place] [address]    Culture  STREPTOCOCCUS MITIS/ORALIS (A)  Final   Report Status 12/12/2023 FINAL  Final   Organism ID, Bacteria STREPTOCOCCUS MITIS/ORALIS  Final      Susceptibility   Streptococcus mitis/oralis - MIC*    PENICILLIN <=0.06 SENSITIVE Sensitive     CEFTRIAXONE  <=0.12 SENSITIVE Sensitive     LEVOFLOXACIN  1 SENSITIVE Sensitive     VANCOMYCIN  0.5 SENSITIVE Sensitive     * STREPTOCOCCUS MITIS/ORALIS  Blood Culture (routine x 2)     Status: Abnormal   Collection Time: 12/09/23 10:21 PM   Specimen: BLOOD RIGHT ARM  Result Value Ref Range Status   Specimen Description BLOOD RIGHT ARM  Final   Special Requests   Final    BOTTLES DRAWN AEROBIC AND ANAEROBIC Blood Culture adequate volume   Culture  Setup Time   Final    GRAM POSITIVE COCCI IN CHAINS IN BOTH AEROBIC AND ANAEROBIC BOTTLES CRITICAL VALUE NOTED.  VALUE IS CONSISTENT WITH PREVIOUSLY REPORTED AND CALLED VALUE.    Culture (A)  Final    STREPTOCOCCUS MITIS/ORALIS SUSCEPTIBILITIES PERFORMED ON PREVIOUS CULTURE WITHIN THE LAST 5 DAYS. Performed at [facility], [address], [place], [place] [address]    Report Status 12/12/2023 FINAL  Final  Blood Culture ID Panel (Reflexed)     Status: Abnormal   Collection Time: 12/09/23 10:21 PM  Result Value Ref Range Status   Enterococcus faecalis NOT DETECTED NOT DETECTED Final   Enterococcus Faecium NOT DETECTED NOT DETECTED Final   Listeria monocytogenes NOT DETECTED NOT DETECTED Final   Staphylococcus species NOT DETECTED NOT DETECTED Final   Staphylococcus aureus (BCID) NOT DETECTED NOT DETECTED Final   Staphylococcus epidermidis NOT DETECTED NOT DETECTED Final   Staphylococcus lugdunensis NOT DETECTED NOT DETECTED Final   Streptococcus species DETECTED (A) NOT DETECTED Final    Comment: Not Enterococcus species, Streptococcus agalactiae, Streptococcus pyogenes, or Streptococcus pneumoniae. CRITICAL RESULT CALLED TO, READ BACK BY AND VERIFIED WITH: PHARMD J.FRENS AT 1400 ON 12/10/2023 BY  T.SAAD.     Streptococcus agalactiae NOT DETECTED NOT DETECTED Final   Streptococcus pneumoniae NOT DETECTED NOT DETECTED Final   Streptococcus pyogenes NOT DETECTED NOT DETECTED Final   A.calcoaceticus-baumannii NOT DETECTED NOT DETECTED Final   Bacteroides fragilis NOT DETECTED NOT DETECTED Final   Enterobacterales NOT DETECTED NOT DETECTED Final   Enterobacter cloacae complex NOT DETECTED NOT DETECTED Final   Escherichia coli NOT DETECTED NOT DETECTED Final   Klebsiella aerogenes NOT DETECTED NOT DETECTED Final   Klebsiella oxytoca NOT DETECTED NOT DETECTED Final   Klebsiella pneumoniae NOT DETECTED NOT DETECTED Final   Proteus species NOT DETECTED NOT DETECTED Final   Salmonella species NOT DETECTED NOT DETECTED Final   Serratia marcescens NOT DETECTED NOT DETECTED Final   Haemophilus influenzae NOT DETECTED NOT DETECTED Final   Neisseria meningitidis NOT DETECTED NOT DETECTED Final   Pseudomonas aeruginosa NOT DETECTED NOT DETECTED Final   Stenotrophomonas maltophilia NOT DETECTED NOT DETECTED Final   Candida albicans  NOT DETECTED NOT DETECTED Final   Candida auris NOT DETECTED NOT DETECTED Final   Candida glabrata NOT DETECTED NOT DETECTED Final   Candida krusei NOT DETECTED NOT DETECTED Final   Candida parapsilosis NOT DETECTED NOT DETECTED Final   Candida tropicalis NOT DETECTED NOT DETECTED Final   Cryptococcus neoformans/gattii NOT DETECTED NOT DETECTED Final    Comment: Performed at [facility], [address], [place], [place] [address]  Culture, blood (Routine X 2) w Reflex to ID Panel     Status: None   Collection Time: 12/12/23 11:04 AM   Specimen: BLOOD RIGHT ARM  Result Value Ref Range Status   Specimen Description BLOOD RIGHT ARM  Final   Special Requests   Final    BOTTLES DRAWN AEROBIC AND ANAEROBIC Blood Culture adequate volume   Culture   Final    NO GROWTH 5 DAYS Performed at [facility], [address] [place], [place] [address]    Report  Status 12/17/2023 FINAL  Final  Culture, blood (Routine X 2) w Reflex to ID Panel     Status: None   Collection Time: 12/12/23 11:04 AM   Specimen: BLOOD RIGHT ARM  Result Value Ref Range Status   Specimen Description BLOOD RIGHT ARM  Final   Special Requests   Final    BOTTLES DRAWN AEROBIC AND ANAEROBIC Blood Culture adequate volume   Culture   Final    NO GROWTH 5 DAYS Performed at [facility], [address] [place], [place] [address]    Report Status 12/17/2023 FINAL  Final     Radiology Studies: CARDIAC CATHETERIZATION Result Date: 12/16/2023 Images from the original result were not included. Angiographically minimal CAD-stable from previous catheterization with maybe a 30% stenosis in the LAD and a diagonal branch that is stable. Elevated Troponin most likely consistent with Demand Ischemia without ACS/non-STEMI => elevated troponin in the setting of known aortic stenosis, ESRD on dialysis and bacteremia. Mild pulmonary hypertension with mean PA PA of 24 to 27 mmHg with a mildly elevated PCWP of 15 mmHg. Mean AVG estimated 23 mmHg suggesting Moderate AS with LVEDP 18 mmHg RECOMMENDATIONS Okay to stop IV heparin  Would not give nitrates Continue volume management by hemodialysis Alm Clay, MD  ECHO TEE Result Date: 12/16/2023    TRANSESOPHOGEAL ECHO REPORT   Patient Name:   Kirk [name] Date of Exam: 12/16/2023 Medical Rec #:  [id number]       Height:       67.0 in Accession #:    [id number]      Weight:       176.9 lb Date of Birth:  23-Sep-1960        BSA:          1.919 m Patient Age:    63 years        BP:           141/60 mmHg Patient Gender: M               HR:           85 bpm. Exam Location:  Inpatient Procedure: 2D Echo, Cardiac Doppler and Color Doppler (Both Spectral and Color            Flow Doppler were utilized during procedure). Indications:     Fever  History:         Patient has prior history of Echocardiogram examinations, most  recent  12/20/2023. Previous Myocardial Infarction, COPD,                  Signs/Symptoms:Chest Pain; Risk Factors:Dyslipidemia, Diabetes,                  Hypertension and Former Smoker.  Sonographer:     Merlynn Argyle RDCS Referring Phys:  [id number] THOM CROME HALEY Diagnosing Phys: Annabella Scarce MD PROCEDURE: After discussion of the risks and benefits of a TEE, an informed consent was obtained from the patient. The transesophogeal probe was passed without difficulty through the esophogus of the patient. Imaged were obtained with the patient in a left lateral decubitus position. Sedation performed by different physician. The patient was monitored while under deep sedation. Anesthestetic sedation was provided intravenously by Anesthesiology: 302.28mg  of Propofol , 0mg  of Lidocaine . The patient's vital signs; including heart rate, blood pressure, and oxygen  saturation; remained stable throughout the procedure. The patient developed no complications during the procedure.  IMPRESSIONS  1. Left ventricular ejection fraction, by estimation, is 25 to 30%. The left ventricle has severely decreased function. The left ventricle demonstrates global hypokinesis.  2. Right ventricular systolic function is mildly reduced. The right ventricular size is normal.  3. No left atrial/left atrial appendage thrombus was detected.  4. The mitral valve is degenerative. Trivial mitral valve regurgitation. No evidence of mitral stenosis.  5. Partial fusion of the left and right coronary cusps. The aortic valve is tricuspid. There is moderate calcification of the aortic valve. There is moderate thickening of the aortic valve. Aortic valve regurgitation is not visualized. Moderate aortic valve stenosis. Aortic valve area, by VTI measures 1.30 cm. Aortic valve mean gradient measures 14.0 mmHg. Aortic valve Vmax measures 2.74 m/s.  6. The inferior vena cava is normal in size with greater than 50% respiratory variability, suggesting right atrial pressure of  3 mmHg. Conclusion(s)/Recommendation(s): No evidence of vegetation/infective endocarditis on this transesophageael echocardiogram. FINDINGS  Left Ventricle: Left ventricular ejection fraction, by estimation, is 25 to 30%. The left ventricle has severely decreased function. The left ventricle demonstrates global hypokinesis. The left ventricular internal cavity size was normal in size. There is no left ventricular hypertrophy. Right Ventricle: The right ventricular size is normal. No increase in right ventricular wall thickness. Right ventricular systolic function is mildly reduced. Left Atrium: Left atrial size was normal in size. No left atrial/left atrial appendage thrombus was detected. Right Atrium: Right atrial size was normal in size. Pericardium: There is no evidence of pericardial effusion. Mitral Valve: The mitral valve is degenerative in appearance. Mild mitral annular calcification. Trivial mitral valve regurgitation. No evidence of mitral valve stenosis. Tricuspid Valve: The tricuspid valve is normal in structure. Tricuspid valve regurgitation is mild . No evidence of tricuspid stenosis. Aortic Valve: Partial fusion of the left and right coronary cusps. The aortic valve is tricuspid. There is moderate calcification of the aortic valve. There is moderate thickening of the aortic valve. Aortic valve regurgitation is not visualized. Moderate aortic stenosis is present. Aortic valve mean gradient measures 14.0 mmHg. Aortic valve peak gradient measures 30.0 mmHg. Aortic valve area, by VTI measures 1.30 cm. Pulmonic Valve: The pulmonic valve was normal in structure. Pulmonic valve regurgitation is not visualized. No evidence of pulmonic stenosis. Aorta: The aortic root and ascending aorta are structurally normal, with no evidence of dilitation. There is atheroma plaque involving the descending aorta. Venous: The inferior vena cava is normal in size with greater than 50% respiratory variability, suggesting  right atrial  pressure of 3 mmHg. IAS/Shunts: No atrial level shunt detected by color flow Doppler. Additional Comments: Spectral Doppler performed. LEFT VENTRICLE PLAX 2D LVOT diam:     2.30 cm LV SV:         66 LV SV Index:   35 LVOT Area:     4.15 cm  AORTIC VALVE AV Area (Vmax):    1.34 cm AV Area (Vmean):   1.46 cm AV Area (VTI):     1.30 cm AV Vmax:           274.00 cm/s AV Vmean:          171.000 cm/s AV VTI:            0.513 m AV Peak Grad:      30.0 mmHg AV Mean Grad:      14.0 mmHg LVOT Vmax:         88.40 cm/s LVOT Vmean:        60.200 cm/s LVOT VTI:          0.160 m LVOT/AV VTI ratio: 0.31  AORTA Ao Root diam: 3.50 cm Ao Asc diam:  3.60 cm TRICUSPID VALVE TR Peak grad:   48.4 mmHg TR Vmax:        348.00 cm/s  SHUNTS Systemic VTI:  0.16 m Systemic Diam: 2.30 cm Annabella Scarce MD Electronically signed by Annabella Scarce MD Signature Date/Time: 12/16/2023/1:54:31 PM    Final    EP STUDY Result Date: 12/16/2023 See surgical note for result.    Scheduled Meds:  aspirin  EC  81 mg Oral Daily   Chlorhexidine  Gluconate Cloth  6 each Topical Q0600   [START ON 12/18/2023] darbepoetin (ARANESP ) injection - DIALYSIS  100 mcg Subcutaneous Q Wed-1800   heparin   5,000 Units Subcutaneous Q8H   hydrALAZINE   50 mg Oral Q8H   lidocaine   1 patch Transdermal Q24H   [START ON 12/18/2023] metoprolol  succinate  37.5 mg Oral Daily   pravastatin   40 mg Oral QHS   sevelamer  carbonate  4,800 mg Oral TID WC   sodium chloride  flush  3 mL Intravenous Q12H   Continuous Infusions:  sodium chloride       ceFAZolin  (ANCEF ) IV       LOS: 7 days   Fredia Skeeter, MD [facility]  12/17/2023, 12:30 PM   *Please note that this is a verbal dictation therefore any spelling or grammatical errors are due to the Dragon Medical One system interpretation.  Please page via Amion and do not message via secure chat for urgent patient care matters. Secure chat can be used for non urgent patient care  matters.  How to contact the [facility] Attending or Consulting provider 7A - 7P or covering provider during after hours 7P -7A, for this patient?  Check the care team in [facility] and look for a) attending/consulting [facility] provider listed and b) the [facility] team listed. Page or secure chat 7A-7P. Log into [email or web address] and use 's universal password to access. If you do not have the password, please contact the hospital operator. Locate the [facility] provider you are looking for under [facility] and page to a number that you can be directly reached. If you still have difficulty reaching the provider, please page the [facility] (Director on Call) for the Hospitalists listed on amion for assistance.

## 2023-12-18 ENCOUNTER — Other Ambulatory Visit (HOSPITAL_COMMUNITY): Payer: Self-pay

## 2023-12-18 DIAGNOSIS — I214 Non-ST elevation (NSTEMI) myocardial infarction: Secondary | ICD-10-CM | POA: Diagnosis not present

## 2023-12-18 LAB — CBC
HCT: 27.4 % — ABNORMAL LOW (ref 39.0–52.0)
Hemoglobin: 9 g/dL — ABNORMAL LOW (ref 13.0–17.0)
MCH: 31.5 pg (ref 26.0–34.0)
MCHC: 32.8 g/dL (ref 30.0–36.0)
MCV: 95.8 fL (ref 80.0–100.0)
Platelets: 138 K/uL — ABNORMAL LOW (ref 150–400)
RBC: 2.86 MIL/uL — ABNORMAL LOW (ref 4.22–5.81)
RDW: 16.4 % — ABNORMAL HIGH (ref 11.5–15.5)
WBC: 6.9 K/uL (ref 4.0–10.5)
nRBC: 0 % (ref 0.0–0.2)

## 2023-12-18 MED ORDER — HYDRALAZINE HCL 50 MG PO TABS
50.0000 mg | ORAL_TABLET | Freq: Once | ORAL | Status: AC
  Administered 2023-12-18: 50 mg via ORAL
  Filled 2023-12-18: qty 1

## 2023-12-18 MED ORDER — DOXAZOSIN MESYLATE 8 MG PO TABS
8.0000 mg | ORAL_TABLET | Freq: Every day | ORAL | 0 refills | Status: DC
  Filled 2023-12-18: qty 30, 30d supply, fill #0

## 2023-12-18 MED ORDER — METOPROLOL SUCCINATE ER 25 MG PO TB24
37.5000 mg | ORAL_TABLET | Freq: Every day | ORAL | 0 refills | Status: DC
  Filled 2023-12-18: qty 45, 30d supply, fill #0

## 2023-12-18 MED ORDER — HYDRALAZINE HCL 100 MG PO TABS
100.0000 mg | ORAL_TABLET | Freq: Three times a day (TID) | ORAL | 0 refills | Status: DC
  Filled 2023-12-18: qty 90, 30d supply, fill #0

## 2023-12-18 NOTE — Progress Notes (Signed)
 [facility] Progress Note   Subjective:   Reports he is feeling well. Reports having some shoulder pain but not chest pain. Denies SOB, dizziness, nausea.   Objective Vitals:   12/17/23 2025 12/18/23 0009 12/18/23 0452 12/18/23 0806  BP: (!) 151/64 (!) 167/79 (!) 144/76 (!) 157/75  Pulse: 81 85 80 83  Resp: 18 18 18 18   Temp: 97.8 F (36.6 C) 98.2 F (36.8 C) 97.7 F (36.5 C) 97.6 F (36.4 C)  TempSrc: Oral Oral Oral Oral  SpO2:  100% 99% 97%  Weight:   79.9 kg   Height:       Physical Exam General: Heart: Lungs: Abdomen: Extremities: Dialysis Access:   Additional Objective Labs: Basic Metabolic Panel: Recent Labs  Lab 12/13/23 0412 12/15/23 1300 12/16/23 0903 12/16/23 0919 12/16/23 1415 12/16/23 1420 12/16/23 1421 12/16/23 1554 12/17/23 1214  NA 134* 133* 140 132*   < > 129* 130*  --  128*  K 4.2 4.4 3.2* 4.6   < > 4.5 4.7  --  5.1  CL 94* 91* 107 94*  --   --   --   --  90*  CO2 25 25  --   --   --   --   --   --  25  GLUCOSE 114* 125* 72 100*  --   --   --   --  102*  BUN 37* 37* 15 23  --   --   --   --  32*  CREATININE 7.82* 9.58* 5.20* 7.20*  --   --   --  7.95* 10.00*  CALCIUM  9.1 9.2  --   --   --   --   --   --  9.4  PHOS 6.7* 7.8*  --   --   --   --   --   --  6.5*   < > = values in this interval not displayed.   Liver Function Tests: Recent Labs  Lab 12/13/23 0412 12/15/23 1300 12/17/23 1214  ALBUMIN  2.4* 2.6* 2.9*   No results for input(s): LIPASE, AMYLASE in the last 168 hours. CBC: Recent Labs  Lab 12/16/23 0401 12/16/23 0903 12/16/23 1554 12/17/23 0457 12/17/23 1214 12/18/23 0404  WBC 5.1  --  4.4 6.2 5.5 6.9  HGB 8.6*   < > 8.5* 8.7* 8.1* 9.0*  HCT 26.0*   < > 26.2* 25.9* 24.8* 27.4*  MCV 95.2  --  96.0 93.5 95.0 95.8  PLT 137*  --  116* 136* 119* 138*   < > = values in this interval not displayed.   Blood Culture    Component Value Date/Time   SDES BLOOD RIGHT ARM 12/12/2023 1104   SDES BLOOD  RIGHT ARM 12/12/2023 1104   SPECREQUEST  12/12/2023 1104    BOTTLES DRAWN AEROBIC AND ANAEROBIC Blood Culture adequate volume   SPECREQUEST  12/12/2023 1104    BOTTLES DRAWN AEROBIC AND ANAEROBIC Blood Culture adequate volume   CULT  12/12/2023 1104    NO GROWTH 5 DAYS Performed at [facility], [address] [place], [place] [address]    CULT  12/12/2023 1104    NO GROWTH 5 DAYS Performed at [facility], [address] [place], [place] [address]    REPTSTATUS 12/17/2023 FINAL 12/12/2023 1104   REPTSTATUS 12/17/2023 FINAL 12/12/2023 1104    Cardiac Enzymes: No results for input(s): CKTOTAL, CKMB, CKMBINDEX, TROPONINI in the last 168 hours. CBG: Recent Labs  Lab 12/12/23 0827 12/14/23 1236  GLUCAP 141* 130*   Iron Studies: No results for input(s): IRON, TIBC, TRANSFERRIN, FERRITIN in the last 72 hours. @lablastinr3 @ Studies/Results: CARDIAC CATHETERIZATION Result Date: 12/16/2023 Images from the original result were not included. Angiographically minimal CAD-stable from previous catheterization with maybe a 30% stenosis in the LAD and a diagonal branch that is stable. Elevated Troponin most likely consistent with Demand Ischemia without ACS/non-STEMI => elevated troponin in the setting of known aortic stenosis, ESRD on dialysis and bacteremia. Mild pulmonary hypertension with mean PA PA of 24 to 27 mmHg with a mildly elevated PCWP of 15 mmHg. Mean AVG estimated 23 mmHg suggesting Moderate AS with LVEDP 18 mmHg RECOMMENDATIONS Okay to stop IV heparin  Would not give nitrates Continue volume management by hemodialysis Kirk Clay, MD  ECHO TEE Result Date: 12/16/2023    TRANSESOPHOGEAL ECHO REPORT   Patient Name:   Kirk [name] Date of Exam: 12/16/2023 Medical Rec #:  [id number]       Height:       67.0 in Accession #:    [id number]      Weight:       176.9 lb Date of Birth:  December 24, 1960        BSA:          1.919 m Patient Age:    63 years        BP:            141/60 mmHg Patient Gender: M               HR:           85 bpm. Exam Location:  Inpatient Procedure: 2D Echo, Cardiac Doppler and Color Doppler (Both Spectral and Color            Flow Doppler were utilized during procedure). Indications:     Fever  History:         Patient has prior history of Echocardiogram examinations, most                  recent 12/20/2023. Previous Myocardial Infarction, COPD,                  Signs/Symptoms:Chest Pain; Risk Factors:Dyslipidemia, Diabetes,                  Hypertension and Former Smoker.  Sonographer:     Merlynn Argyle RDCS Referring Phys:  [id number] THOM CROME HALEY Diagnosing Phys: Annabella Scarce MD PROCEDURE: After discussion of the risks and benefits of a TEE, an informed consent was obtained from the patient. The transesophogeal probe was passed without difficulty through the esophogus of the patient. Imaged were obtained with the patient in a left lateral decubitus position. Sedation performed by different physician. The patient was monitored while under deep sedation. Anesthestetic sedation was provided intravenously by Anesthesiology: 302.28mg  of Propofol , 0mg  of Lidocaine . The patient's vital signs; including heart rate, blood pressure, and oxygen  saturation; remained stable throughout the procedure. The patient developed no complications during the procedure.  IMPRESSIONS  1. Left ventricular ejection fraction, by estimation, is 25 to 30%. The left ventricle has severely decreased function. The left ventricle demonstrates global hypokinesis.  2. Right ventricular systolic function is mildly reduced. The right ventricular size is normal.  3. No left atrial/left atrial appendage thrombus was detected.  4. The mitral valve is degenerative. Trivial mitral valve regurgitation. No evidence of mitral stenosis.  5. Partial fusion of the left and right  coronary cusps. The aortic valve is tricuspid. There is moderate calcification of the aortic valve. There is moderate  thickening of the aortic valve. Aortic valve regurgitation is not visualized. Moderate aortic valve stenosis. Aortic valve area, by VTI measures 1.30 cm. Aortic valve mean gradient measures 14.0 mmHg. Aortic valve Vmax measures 2.74 m/s.  6. The inferior vena cava is normal in size with greater than 50% respiratory variability, suggesting right atrial pressure of 3 mmHg. Conclusion(s)/Recommendation(s): No evidence of vegetation/infective endocarditis on this transesophageael echocardiogram. FINDINGS  Left Ventricle: Left ventricular ejection fraction, by estimation, is 25 to 30%. The left ventricle has severely decreased function. The left ventricle demonstrates global hypokinesis. The left ventricular internal cavity size was normal in size. There is no left ventricular hypertrophy. Right Ventricle: The right ventricular size is normal. No increase in right ventricular wall thickness. Right ventricular systolic function is mildly reduced. Left Atrium: Left atrial size was normal in size. No left atrial/left atrial appendage thrombus was detected. Right Atrium: Right atrial size was normal in size. Pericardium: There is no evidence of pericardial effusion. Mitral Valve: The mitral valve is degenerative in appearance. Mild mitral annular calcification. Trivial mitral valve regurgitation. No evidence of mitral valve stenosis. Tricuspid Valve: The tricuspid valve is normal in structure. Tricuspid valve regurgitation is mild . No evidence of tricuspid stenosis. Aortic Valve: Partial fusion of the left and right coronary cusps. The aortic valve is tricuspid. There is moderate calcification of the aortic valve. There is moderate thickening of the aortic valve. Aortic valve regurgitation is not visualized. Moderate aortic stenosis is present. Aortic valve mean gradient measures 14.0 mmHg. Aortic valve peak gradient measures 30.0 mmHg. Aortic valve area, by VTI measures 1.30 cm. Pulmonic Valve: The pulmonic valve was  normal in structure. Pulmonic valve regurgitation is not visualized. No evidence of pulmonic stenosis. Aorta: The aortic root and ascending aorta are structurally normal, with no evidence of dilitation. There is atheroma plaque involving the descending aorta. Venous: The inferior vena cava is normal in size with greater than 50% respiratory variability, suggesting right atrial pressure of 3 mmHg. IAS/Shunts: No atrial level shunt detected by color flow Doppler. Additional Comments: Spectral Doppler performed. LEFT VENTRICLE PLAX 2D LVOT diam:     2.30 cm LV SV:         66 LV SV Index:   35 LVOT Area:     4.15 cm  AORTIC VALVE AV Area (Vmax):    1.34 cm AV Area (Vmean):   1.46 cm AV Area (VTI):     1.30 cm AV Vmax:           274.00 cm/s AV Vmean:          171.000 cm/s AV VTI:            0.513 m AV Peak Grad:      30.0 mmHg AV Mean Grad:      14.0 mmHg LVOT Vmax:         88.40 cm/s LVOT Vmean:        60.200 cm/s LVOT VTI:          0.160 m LVOT/AV VTI ratio: 0.31  AORTA Ao Root diam: 3.50 cm Ao Asc diam:  3.60 cm TRICUSPID VALVE TR Peak grad:   48.4 mmHg TR Vmax:        348.00 cm/s  SHUNTS Systemic VTI:  0.16 m Systemic Diam: 2.30 cm Annabella Scarce MD Electronically signed by Annabella Scarce MD Signature Date/Time: 12/16/2023/1:54:31 PM  Final    EP STUDY Result Date: 12/16/2023 See surgical note for result.  Medications:   ceFAZolin  (ANCEF ) IV 2 g (12/17/23 2030)    aspirin  EC  81 mg Oral Daily   Chlorhexidine  Gluconate Cloth  6 each Topical Q0600   darbepoetin (ARANESP ) injection - DIALYSIS  100 mcg Subcutaneous Q Wed-1800   heparin   5,000 Units Subcutaneous Q8H   hydrALAZINE   50 mg Oral Q8H   hydrALAZINE   50 mg Oral Once   lidocaine   1 patch Transdermal Q24H   metoprolol  succinate  37.5 mg Oral Daily   pravastatin   40 mg Oral QHS   sevelamer  carbonate  4,800 mg Oral TID WC   sodium chloride  flush  3 mL Intravenous Q12H    Dialysis Orders: [facility] MWF  4h   B400   81.3kg   LFA AVF    Heparin  2500 Last OP HD 11/17, post wt 82kg Mircera 75 mcg q 4wks, last 11/12, due 12/10  Assessment/Plan: NSTEMI/Elevated troponin. IV Heparin /nitroglycerin   -  troponin trending up. S/p TEE and right and left heart cath yesterday with stable CAD and mildly elevated LVEDP and PCWP. HFrEF 25-30%.  Volume overload on arrival. Optimize volume with HD.  Had extra HD Tues. Now below EDW but likely has lost some weight. UF as tolerated. Strep mitis bacteremia. Antibiotics per primary. ID following. S/p TEE yesterday with no thrombus or mass. Plan is for cefazolin  for 10 days of abx total per ID ESRD: on HD MWF. Due to Thanksgiving Holiday will have dialysis on Sunday, Tuesday and Friday this week.   HTN. BP was soft after dialysis/surgery. Now moderately hypertensive. Will CTM and adjust meds as needed Anemia. Hgb 9.0, s/p 1 unit pRBC Transfuse prbcs.  ESA recently dosed. Will increase frequency to q 2 wks - next ESA due 11/26, will order with next HD d/t holiday schedule.   2HPTH. Ca controlled. Phos elevated. Continue home meds. Asked to switch to Renvela  pills to avoid increased fluids with taking powder. Equivalent dose - 6 pills TID.  Aortic stenosis- TCTS consulted. Planning for outpatient TAVR which is now on hold d/t bacteremia. Nutrition - Renal diet w/fluid restrictions. Disposition - stable for discharge from renal standpoint.   Lucie Collet, PA-C 12/18/2023, 9:16 AM  [facility] Pager: [phone]

## 2023-12-18 NOTE — Anesthesia Postprocedure Evaluation (Signed)
 Anesthesia Post Note  Patient: Kirk [name]  Procedure(s) Performed: TRANSESOPHAGEAL ECHOCARDIOGRAM     Patient location during evaluation: Cath Lab Anesthesia Type: MAC Level of consciousness: awake and alert Pain management: pain level controlled Vital Signs Assessment: post-procedure vital signs reviewed and stable Respiratory status: spontaneous breathing, nonlabored ventilation, respiratory function stable and patient connected to nasal cannula oxygen  Cardiovascular status: blood pressure returned to baseline and stable Postop Assessment: no apparent nausea or vomiting Anesthetic complications: no   No notable events documented.  Last Vitals:  Vitals:   12/18/23 0452 12/18/23 0806  BP: (!) 144/76 (!) 157/75  Pulse: 80 83  Resp: 18 18  Temp: 36.5 C 36.4 C  SpO2: 99% 97%    Last Pain:  Vitals:   12/18/23 1000  TempSrc:   PainSc: 0-No pain                 Kirk [name]

## 2023-12-18 NOTE — Discharge Summary (Signed)
 Physician Discharge Summary  Kirk [name] FMW:[id number] DOB: [birth year] DOA: 12/09/2023  PCP: Leonarda Roxan [name], NP  Admit date: 12/09/2023 Discharge date: 12/18/2023 30 Day Unplanned Readmission Risk Score    Flowsheet Row ED to Hosp-Admission (Current) from 12/09/2023 in [place] [facility]  30 Day Unplanned Readmission Risk Score (%) 58.92 Filed at 12/18/2023 0400    This score is the patient's risk of an unplanned readmission within 30 days of being discharged (0 -100%). The score is based on dignosis, age, lab data, medications, orders, and past utilization.   Low:  0-14.9   Medium: 15-21.9   High: 22-29.9   Extreme: 30 and above          Admitted From: Home Disposition: Home  Recommendations for Outpatient Follow-up:  Follow up with PCP in 1-2 weeks Please obtain BMP/CBC in one week Follow-up with your primary cardiologist in 2 to 4 weeks Follow-up with the structural heart team in about 8 weeks Please follow up with your PCP on the following pending results: Unresulted Labs (From admission, onward)     Start     Ordered   12/12/23 0500  CBC  Daily,   R      12/11/23 0535              Home Health: Yes Equipment/Devices: None  Discharge Condition: Stable CODE STATUS: Full code Diet recommendation:  Diet Order             Diet Heart Room service appropriate? Yes; Fluid consistency: Thin  Diet effective now                   Subjective: Patient seen and examined, he has no complaints.  Wife at the bedside.  He desires to go home.  Brief/Interim Summary: Kirk [name] is a 63 y.o. 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  He did complete 4 hours of hemodialysis.  His pain was, 10 out  of 10 and associated with shortness of breath.  Worse when taking deep breath.  EMS was activated.   In the ER, he received full dose aspirin  324 mg x 1.  His chest pain persisted.  His twelve-lead EKG showed no evidence of acute ischemia.  High-sensitivity troponin 153 with delta, repeat 1098.  EDP discussed the case with cardiology recommended heparin  drip and admit for possible NSTEMI.  Details below.   History of severe low-flow low gradient aortic stenosis/elevated troponin/demand ischemia chest pain initially thought to be due to hypotension during HD in the setting of low-flow low gradient severe aortic stenosis with a recent unremarkable left heart catheterization (10/04/23), Also, patient recently had CT coronary about 3 weeks ago which showed diffuse aortic atherosclerosis. and a history of chronic HD related chest pain (noted on patient's primary cardiologist notes). Echocardiogram showed unchanged left ventricular function however is significantly more dilated compared to prior. ECG with ST depressions, however similar ST depressions noted on prior ECGs. Also anemic. Patient received heparin  and nitroglycerin  drip for approximately 48 hours which were discontinued on 12/11/2023 per cardiology's recommendation, although his chest pain did not improve but then it did improve with lidocaine  patch however his troponin did significantly rise and plateaued which is not consistent with his prior chronically elevated troponin and therefore cardiology  now opined that he may have ACS and for that reason, he was restarted on heparin  drip on 12/13/2023 with a plan to continue for another 48 hours and TEE was rescheduled for Monday, 12/16/2023.  On 12/14/2023, patient was noted to have exertional dyspnea and presyncopal episode with minimal activity.  Troponins were once again checked and they were downtrending. Underwent cardiac catheterization 11/24 with minimal CAD, stable from prior catheterization with  elevated troponin felt to be secondary to demand ischemia in the setting of aortic stenosis, ESRD and bacteremia, NSTEMI ruled out. Recommendations to avoid nitrates, remains on ASA 81mg  daily.  Patient was also reassessed by structural heart team yesterday, due to active bacteremia, they recommend for the patient to complete antibiotic treatment and follow-up with them in the clinic in 8 weeks for consideration of TAVR.   Sepsis secondary to gram-positive bacteremia, not POA: Upon arrival, patient did not have any signs or symptoms of sepsis.  After admission, patient spiked fever with Tmax of 102.9 at around 4 PM on 12/10/2023 along with tachycardia and tachypnea and blood culture was positive with gram-positive cocci.  Patient started on Rocephin .  Last temperature spike of 100.9 at 7 PM 12/10/2023.  Blood culture now growing Streptococcus mitis/oralis which is pansensitive.  Structural cardiology team was considering TAVR on him outpatient but per them, due to bacteremia, they postponed it.  ID was consulted.  Patient was continued on Rocephin , patient eventually underwent TEE 12/16/2023, no vegetations found.  ID recommends cefazolin  with HD for 10 days.   ESRD on HD/volume overload: Upon arrival to ED, patient received IV fluid bolus and was continued on slow IV fluids.  Typically receives dialysis on Monday Wednesday Friday schedule, due to volume overload, patient received dialysis on Tuesday and then on Wednesday to put him back on the schedule.  Patient received his last hemodialysis yesterday.   History of paroxysmal atrial fibrillation: Cardiology recommended discharging on Toprol -XL 37.5 mg.  No anticoagulation was recommended.   Chronic combined systolic and diastolic congestive heart failure: Volume management by dialysis.has had some low BPs but overall stable.  Per cardiology, DC lisinopril , resume home hydralazine  50mg  TID and increase Toprol  to 37.5mg  daily ; with the exception of the  combination of nitrate plus hydralazine  for afterload reduction, in the absence of macrovascular CAD, no real indication for nitrate.    Essential hypertension: Blood pressure remains elevated despite of Toprol -XL.  Will start him on lisinopril  20 mg.   Hyperlipidemia: Continue statin.   QTc prolongation: Upon admission, QTc was 542.  We optimize electrolytes.   Generalized weakness: Seen by OT who recommended SNF however PT recommended home health PT, he walked 300 feet with them.  Patient also prefers to go home.  Discharge plan was discussed with patient and/or family member and they verbalized understanding and agreed with it.  Discharge Diagnoses:  Principal Problem:   NSTEMI (non-ST elevated myocardial infarction) [facility]) Active Problems:   Chest pain   Streptococcal bacteremia   Bacteremia    Discharge Instructions  Discharge Instructions     AMB referral to Phase II Cardiac Rehabilitation   Complete by: As directed    Diagnosis: NSTEMI   After initial evaluation and assessments completed: Virtual Based Care may be provided alone or in conjunction with Phase 2 Cardiac Rehab based on patient barriers.: Yes   Intensive Cardiac Rehabilitation (ICR) [facility] location only OR Traditional Cardiac Rehabilitation (TCR) *If criteria for ICR are not met will enroll in TCR ([facility] only): Yes  Home infusion instructions   Complete by: As directed    Instructions: Flushing of vascular access device: 0.9% NaCl pre/post medication administration and prn patency; Heparin  100 u/ml, 5ml for implanted ports and Heparin  10u/ml, 5ml for all other central venous catheters.      Allergies as of 12/18/2023       Reactions   Codeine Nausea And Vomiting   Lortab [hydrocodone -acetaminophen ] Nausea And Vomiting        Medication List     STOP taking these medications    fluticasone -salmeterol 45-21 MCG/ACT inhaler Commonly known as: Advair HFA   metoprolol  tartrate 50 MG tablet Commonly known  as: LOPRESSOR        TAKE these medications    albuterol  (2.5 MG/3ML) 0.083% nebulizer solution Commonly known as: PROVENTIL  Take 3 mLs (2.5 mg total) by nebulization every 4 (four) hours as needed for wheezing or shortness of breath.   aspirin  EC 81 MG tablet Take 81 mg by mouth daily at 6 PM.   BENADRYL  PO Take 4 tablets by mouth every 6 (six) hours. 0000, 0600, 1200, 1800   ceFAZolin  IVPB Commonly known as: ANCEF  Inject 2 g into the vein every Friday with hemodialysis for 1 day. Indication:  strep mitis bacteremia First Dose: Yes Last Day of Therapy:  12/20/23 Labs - Once weekly:  CBC/D and BMP, Start taking on: December 20, 2023   doxazosin  8 MG tablet Commonly known as: CARDURA  Take 8 mg by mouth at bedtime.   hydrALAZINE  100 MG tablet Commonly known as: APRESOLINE  Take 1 tablet (100 mg total) by mouth 3 (three) times daily. What changed:  medication strength how much to take   ipratropium-albuterol  0.5-2.5 (3) MG/3ML Soln Commonly known as: DUONEB Take 3 mLs by nebulization every 6 (six) hours. Please take every 6 hours scheduled for next 5 days then as needed.   metoprolol  succinate 25 MG 24 hr tablet Commonly known as: TOPROL -XL Take 1.5 tablets (37.5 mg total) by mouth daily. What changed: how much to take   multivitamin Tabs tablet Take 1 tablet by mouth daily.   oxyCODONE  5 MG immediate release tablet Commonly known as: Roxicodone  Take 1 tablet (5 mg total) by mouth every 4 (four) hours as needed for severe pain (pain score 7-10).   OXYGEN  Inhale 3 L/min into the lungs continuous.   pantoprazole  40 MG tablet Commonly known as: PROTONIX  Take 40 mg by mouth daily as needed (heartburn).   pravastatin  40 MG tablet Commonly known as: PRAVACHOL  TAKE 1 TABLET BY MOUTH EVERY DAY IN THE EVENING   sevelamer  carbonate 2.4 g Pack Commonly known as: RENVELA  Take 4.8 g by mouth 3 (three) times daily with meals. Also takes with his snacks   traZODone  50  MG tablet Commonly known as: DESYREL  TAKE 0.5-1 TABLET (25-50 MG TOTAL) BY MOUTH AT BEDTIME AS NEEDED. FOR SLEEP   TYLENOL  PO Take 4 tablets by mouth every 6 (six) hours. 0000, 0600, 1200, 1800.   UNABLE TO FIND Inhale 1 Device into the lungs at bedtime. CPAP               Home Infusion Instuctions  (From admission, onward)           Start     Ordered   12/17/23 0000  Home infusion instructions       Question:  Instructions  Answer:  Flushing of vascular access device: 0.9% NaCl pre/post medication administration and prn patency; Heparin  100 u/ml, 5ml for implanted ports and Heparin   10u/ml, 5ml for all other central venous catheters.   12/17/23 1237            Follow-up Information     Ngetich, Dinah C, NP Follow up in 1 week(s).   Specialty: Family Medicine Contact information: [address] [place] [address] [phone]                Allergies  Allergen Reactions   Codeine Nausea And Vomiting   Lortab [Hydrocodone -Acetaminophen ] Nausea And Vomiting    Consultations: Cardiology, nephrology, cardiothoracic surgery, ID.   Procedures/Studies: CARDIAC CATHETERIZATION Result Date: 12/16/2023 Images from the original result were not included. Angiographically minimal CAD-stable from previous catheterization with maybe a 30% stenosis in the LAD and a diagonal branch that is stable. Elevated Troponin most likely consistent with Demand Ischemia without ACS/non-STEMI => elevated troponin in the setting of known aortic stenosis, ESRD on dialysis and bacteremia. Mild pulmonary hypertension with mean PA PA of 24 to 27 mmHg with a mildly elevated PCWP of 15 mmHg. Mean AVG estimated 23 mmHg suggesting Moderate AS with LVEDP 18 mmHg RECOMMENDATIONS Okay to stop IV heparin  Would not give nitrates Continue volume management by hemodialysis Alm Clay, MD  ECHO TEE Result Date: 12/16/2023    TRANSESOPHOGEAL ECHO REPORT   Patient Name:   Kirk [name] Date  of Exam: 12/16/2023 Medical Rec #:  [id number]       Height:       67.0 in Accession #:    [id number]      Weight:       176.9 lb Date of Birth:  1960/04/17        BSA:          1.919 m Patient Age:    63 years        BP:           141/60 mmHg Patient Gender: M               HR:           85 bpm. Exam Location:  Inpatient Procedure: 2D Echo, Cardiac Doppler and Color Doppler (Both Spectral and Color            Flow Doppler were utilized during procedure). Indications:     Fever  History:         Patient has prior history of Echocardiogram examinations, most                  recent 12/20/2023. Previous Myocardial Infarction, COPD,                  Signs/Symptoms:Chest Pain; Risk Factors:Dyslipidemia, Diabetes,                  Hypertension and Former Smoker.  Sonographer:     Merlynn Argyle RDCS Referring Phys:  [id number] THOM CROME HALEY Diagnosing Phys: Annabella Scarce MD PROCEDURE: After discussion of the risks and benefits of a TEE, an informed consent was obtained from the patient. The transesophogeal probe was passed without difficulty through the esophogus of the patient. Imaged were obtained with the patient in a left lateral decubitus position. Sedation performed by different physician. The patient was monitored while under deep sedation. Anesthestetic sedation was provided intravenously by Anesthesiology: 302.28mg  of Propofol , 0mg  of Lidocaine . The patient's vital signs; including heart rate, blood pressure, and oxygen  saturation; remained stable throughout the procedure. The patient developed no complications during the procedure.  IMPRESSIONS  1. Left ventricular ejection fraction,  by estimation, is 25 to 30%. The left ventricle has severely decreased function. The left ventricle demonstrates global hypokinesis.  2. Right ventricular systolic function is mildly reduced. The right ventricular size is normal.  3. No left atrial/left atrial appendage thrombus was detected.  4. The mitral valve is degenerative. Trivial  mitral valve regurgitation. No evidence of mitral stenosis.  5. Partial fusion of the left and right coronary cusps. The aortic valve is tricuspid. There is moderate calcification of the aortic valve. There is moderate thickening of the aortic valve. Aortic valve regurgitation is not visualized. Moderate aortic valve stenosis. Aortic valve area, by VTI measures 1.30 cm. Aortic valve mean gradient measures 14.0 mmHg. Aortic valve Vmax measures 2.74 m/s.  6. The inferior vena cava is normal in size with greater than 50% respiratory variability, suggesting right atrial pressure of 3 mmHg. Conclusion(s)/Recommendation(s): No evidence of vegetation/infective endocarditis on this transesophageael echocardiogram. FINDINGS  Left Ventricle: Left ventricular ejection fraction, by estimation, is 25 to 30%. The left ventricle has severely decreased function. The left ventricle demonstrates global hypokinesis. The left ventricular internal cavity size was normal in size. There is no left ventricular hypertrophy. Right Ventricle: The right ventricular size is normal. No increase in right ventricular wall thickness. Right ventricular systolic function is mildly reduced. Left Atrium: Left atrial size was normal in size. No left atrial/left atrial appendage thrombus was detected. Right Atrium: Right atrial size was normal in size. Pericardium: There is no evidence of pericardial effusion. Mitral Valve: The mitral valve is degenerative in appearance. Mild mitral annular calcification. Trivial mitral valve regurgitation. No evidence of mitral valve stenosis. Tricuspid Valve: The tricuspid valve is normal in structure. Tricuspid valve regurgitation is mild . No evidence of tricuspid stenosis. Aortic Valve: Partial fusion of the left and right coronary cusps. The aortic valve is tricuspid. There is moderate calcification of the aortic valve. There is moderate thickening of the aortic valve. Aortic valve regurgitation is not visualized.  Moderate aortic stenosis is present. Aortic valve mean gradient measures 14.0 mmHg. Aortic valve peak gradient measures 30.0 mmHg. Aortic valve area, by VTI measures 1.30 cm. Pulmonic Valve: The pulmonic valve was normal in structure. Pulmonic valve regurgitation is not visualized. No evidence of pulmonic stenosis. Aorta: The aortic root and ascending aorta are structurally normal, with no evidence of dilitation. There is atheroma plaque involving the descending aorta. Venous: The inferior vena cava is normal in size with greater than 50% respiratory variability, suggesting right atrial pressure of 3 mmHg. IAS/Shunts: No atrial level shunt detected by color flow Doppler. Additional Comments: Spectral Doppler performed. LEFT VENTRICLE PLAX 2D LVOT diam:     2.30 cm LV SV:         66 LV SV Index:   35 LVOT Area:     4.15 cm  AORTIC VALVE AV Area (Vmax):    1.34 cm AV Area (Vmean):   1.46 cm AV Area (VTI):     1.30 cm AV Vmax:           274.00 cm/s AV Vmean:          171.000 cm/s AV VTI:            0.513 m AV Peak Grad:      30.0 mmHg AV Mean Grad:      14.0 mmHg LVOT Vmax:         88.40 cm/s LVOT Vmean:        60.200 cm/s LVOT VTI:  0.160 m LVOT/AV VTI ratio: 0.31  AORTA Ao Root diam: 3.50 cm Ao Asc diam:  3.60 cm TRICUSPID VALVE TR Peak grad:   48.4 mmHg TR Vmax:        348.00 cm/s  SHUNTS Systemic VTI:  0.16 m Systemic Diam: 2.30 cm Annabella Scarce MD Electronically signed by Annabella Scarce MD Signature Date/Time: 12/16/2023/1:54:31 PM    Final    EP STUDY Result Date: 12/16/2023 See surgical note for result.  ECHOCARDIOGRAM LIMITED Result Date: 12/10/2023    ECHOCARDIOGRAM LIMITED REPORT   Patient Name:   Kirk [name] Date of Exam: 12/10/2023 Medical Rec #:  [id number]       Height:       67.0 in Accession #:    [id number]      Weight:       180.8 lb Date of Birth:  November 05, 1960        BSA:          1.937 m Patient Age:    63 years        BP:           147/86 mmHg Patient Gender: M                HR:           106 bpm. Exam Location:  Inpatient Procedure: Cardiac Doppler, Limited Echo and Limited Color Doppler (Both            Spectral and Color Flow Doppler were utilized during procedure). Indications:    NSTEMI I21.4  History:        Patient has prior history of Echocardiogram examinations, most                 recent 10/05/2023. CHF and Cardiomyopathy, COPD and ESRD; Risk                 Factors:Hypertension, Sleep Apnea, Diabetes and Dyslipidemia.  Sonographer:    Koleen Popper RDCS Referring Phys: [id number] TERRY SAILOR HALL  Sonographer Comments: Image acquisition challenging due to respiratory motion. IMPRESSIONS  1. Left ventricular ejection fraction, by estimation, is 25 to 30%. The left ventricle has severely decreased function. The left ventricle demonstrates global hypokinesis. The left ventricular internal cavity size was severely dilated. Indeterminate diastolic filling due to E-A fusion.  2. Right ventricular systolic function is low normal. The right ventricular size is normal.  3. Left atrial size was mildly dilated.  4. Mild mitral valve regurgitation. Moderate mitral annular calcification.  5. AV leaflets difficult to identify The valve is thickened, calcified Peak and mean gradients through the valve are 32 and 17 mm Hg respectively. Dimensionless valve index is 0.35 Overall consistent with moderate AS. Compared to echo from Sept 2025, mean gradient is mildly decreased (22 to 17 mm HG).. The aortic valve has an indeterminant number of cusps. Aortic valve regurgitation is not visualized. Comparison(s): The left ventricular function is unchanged. FINDINGS  Left Ventricle: Left ventricular ejection fraction, by estimation, is 25 to 30%. The left ventricle has severely decreased function. The left ventricle demonstrates global hypokinesis. The left ventricular internal cavity size was severely dilated. There is no left ventricular hypertrophy. Indeterminate diastolic filling due to E-A fusion.  Right Ventricle: The right ventricular size is normal. Right vetricular wall thickness was not assessed. Right ventricular systolic function is low normal. Left Atrium: Left atrial size was mildly dilated. Right Atrium: Right atrial size was normal in size. Pericardium: There is no evidence of pericardial  effusion. Mitral Valve: There is mild thickening of the mitral valve leaflet(s). Moderate mitral annular calcification. Mild mitral valve regurgitation. MV peak gradient, 8.5 mmHg. The mean mitral valve gradient is 4.0 mmHg. Tricuspid Valve: The tricuspid valve is normal in structure. Tricuspid valve regurgitation is mild. Aortic Valve: AV leaflets difficult to identify The valve is thickened, calcified Peak and mean gradients through the valve are 32 and 17 mm Hg respectively. Dimensionless valve index is 0.35 Overall consistent with moderate AS. Compared to echo from Sept 2025, mean gradient is mildly decreased (22 to 17 mm HG). The aortic valve has an indeterminant number of cusps. Aortic valve regurgitation is not visualized. Aortic valve mean gradient measures 17.0 mmHg. Aortic valve peak gradient measures 31.4 mmHg. IAS/Shunts: No atrial level shunt detected by color flow Doppler. Additional Comments: Spectral Doppler performed. Color Doppler performed.  LEFT VENTRICLE PLAX 2D LVIDd:         6.20 cm Diastology LVIDs:         5.40 cm LV e' medial:    7.18 cm/s LV PW:         1.00 cm LV E/e' medial:  17.0 LV IVS:        0.80 cm LV e' lateral:   7.07 cm/s                        LV E/e' lateral: 17.3  RIGHT VENTRICLE             IVC RV S prime:     14.80 cm/s  IVC diam: 3.00 cm TAPSE (M-mode): 2.0 cm LEFT ATRIUM           Index LA diam:      4.40 cm 2.27 cm/m LA Vol (A4C): 66.8 ml 34.48 ml/m  AORTIC VALVE AV Vmax:           280.33 cm/s AV Vmean:          190.000 cm/s AV VTI:            0.433 m AV Peak Grad:      31.4 mmHg AV Mean Grad:      17.0 mmHg LVOT Vmax:         100.00 cm/s LVOT Vmean:        67.100 cm/s  LVOT VTI:          0.153 m LVOT/AV VTI ratio: 0.35 MITRAL VALVE MV Area (PHT): 5.66 cm     SHUNTS MV Peak grad:  8.5 mmHg     Systemic VTI: 0.15 m MV Mean grad:  4.0 mmHg MV Vmax:       1.46 m/s MV Vmean:      93.5 cm/s MV Decel Time: 134 msec MV E velocity: 122.00 cm/s MV A velocity: 83.30 cm/s MV E/A ratio:  1.46 Vina Gull MD Electronically signed by Vina Gull MD Signature Date/Time: 12/10/2023/3:26:28 PM    Final    CT Head Wo Contrast Result Date: 12/09/2023 EXAM: CT HEAD WITHOUT CONTRAST 12/09/2023 11:08:09 PM TECHNIQUE: CT of the head was performed without the administration of intravenous contrast. Automated exposure control, iterative reconstruction, and/or weight based adjustment of the mA/kV was utilized to reduce the radiation dose to as low as reasonably achievable. COMPARISON: CT head 03/16/2023. CLINICAL HISTORY: c/f poss meningitis. FINDINGS: BRAIN AND VENTRICLES: No acute hemorrhage. No evidence of acute infarct. No hydrocephalus. No extra-axial collection. No mass effect or midline shift. Atherosclerotic calcifications are present within the cavernous internal carotid  and vertebral arteries. ORBITS: No acute abnormality. SINUSES: No acute abnormality. SOFT TISSUES AND SKULL: No acute soft tissue abnormality. Surgical changes along the right temporal calvarium. No skull fracture. IMPRESSION: 1. No acute intracranial abnormality. Electronically signed by: Morgane Naveau MD 12/09/2023 11:19 PM EST RP Workstation: [id number]   DG Chest Port 1 View Result Date: 12/09/2023 CLINICAL DATA:  Questionable sepsis EXAM: PORTABLE CHEST 1 VIEW COMPARISON:  Chest x-ray 11/15/2023 FINDINGS: Heart is enlarged. There central patchy opacities in both lungs. There is no pleural effusion or pneumothorax. No acute fractures are seen. IMPRESSION: Cardiomegaly with central patchy opacities in both lungs, which may represent pulmonary edema or infection. Electronically Signed   By: Greig Pique M.D.   On:  12/09/2023 23:15   CT CORONARY MORPH W/CTA COR W/SCORE W/CA W/CM &/OR WO/CM Addendum Date: 11/20/2023 ADDENDUM REPORT: 11/20/2023 23:34 EXAM: OVER-READ INTERPRETATION  CT CHEST The following report is an over-read performed by radiologist Dr. Franky Leff [facility] on 11/20/2023. This over-read does not include interpretation of cardiac or coronary anatomy or pathology. The coronary CTA interpretation by the cardiologist is attached. COMPARISON:  None. FINDINGS: Heart is normal size. Aorta normal caliber. Extensive aortic atherosclerosis. No adenopathy. Tree-in-bud nodular densities again noted in the lower lobes bilaterally likely reflecting chronic infectious/inflammatory process. No effusions. No acute findings in the upper abdomen. Chest wall soft tissues are unremarkable. No acute bony abnormality. IMPRESSION: Diffuse aortic atherosclerosis. 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 Electronically Signed   By: Franky Crease M.D.   On: 11/20/2023 23:34   Result Date: 11/20/2023 CLINICAL DATA:  35M with severe aortic stenosis being evaluated for a TAVR procedure. EXAM: Cardiac TAVR CT TECHNIQUE: A non-contrast, gated CT scan was obtained with axial slices of 2.5 mm through the heart for aortic valve scoring. A 120 kV retrospective, gated, contrast cardiac scan was obtained. Gantry rotation speed was 230 msec and collimation was 0.63 mm. Nitroglycerin  was not given. A delayed scan was obtained to exclude left atrial appendage thrombus. The 3D dataset was reconstructed in systole with motion correction. The 3D data set was reconstructed in 5% intervals of the 0-95% of the R-R cycle. Systolic and diastolic phases were analyzed on a dedicated workstation using MPR, MIP, and VRT modes. The patient received 100 cc of contrast. FINDINGS: Aortic Root: Aortic valve: trileaflet Aortic valve calcium  score: 1221 Aortic  annulus: Diameter: 28mm x 24mm Perimeter: 82mm Area: 526mm^2 Calcifications: No calcifications Coronary height: Min Left - 15mm; Min Right - 17mm Sinotubular height: Left cusp - 23mm; Right cusp - 24mm; Noncoronary cusp - 24mm LVOT (as measured 3 mm below the annulus): Diameter: 31mm x 24mm Area: 577mm^2 Calcifications: Mild calcification adjacent noncoronary cusp Aortic sinus width: Left cusp - 34mm; Right cusp - 30mm; Noncoronary cusp - 32mm Sinotubular junction width: 31mm x 29mm Optimum Fluoroscopic Angle for Delivery: LAO 31 CRA 3 Cardiac: Right atrium: Normal size Right ventricle: Normal size Pulmonary arteries: Dilated main pulmonary artery measuring 33mm Pulmonary veins: Normal configuration Left atrium: Mild enlargement Left ventricle: Mild dilatation Pericardium: Normal thickness Coronary arteries: Coronary calcium  score 1959 (99th percentile) IMPRESSION: 1. Tricuspid aortic valve with moderate calcifications (AV calcium  score 1221) 2. Aortic annulus measures 28mm x 24mm in diameter with perimeter 82mm and area 524 mm^2. Mild LVOT calcifications adjacent to noncoronary cusp. Annular measurements are suitable for delivery of a 26mm Edwards Sapien 3 valve 3.  Sufficient coronary to annulus distance. 4.  Optimum Fluoroscopic Angle for Delivery:  LAO 31 CRA 3 5.  Coronary calcium  score 1959 (99th percentile) Electronically Signed: By: Lonni Nanas M.D. On: 11/20/2023 12:53   CT ANGIO CHEST AORTA W/CM & OR WO/CM Result Date: 11/20/2023 CLINICAL DATA:  Aortic valve replacement, preoperative evaluation EXAM: CT ANGIOGRAPHY CHEST WITH CONTRAST TECHNIQUE: Multidetector CT imaging of the chest was performed using the standard protocol during bolus administration of intravenous contrast. Multiplanar CT image reconstructions and MIPs were obtained to evaluate the vascular anatomy. Multiplanar image (3D post-processing) reconstructions and MIPs were obtained to evaluate the vascular anatomy. RADIATION DOSE  REDUCTION: This exam was performed according to the departmental dose-optimization program which includes automated exposure control, adjustment of the mA and/or kV according to patient size and/or use of iterative reconstruction technique. CONTRAST:  OMNIPAQUE  IOHEXOL  350 MG/ML SOLN COMPARISON:  CT angiogram of the chest performed March 16, 2023 FINDINGS: Cardiovascular: Left ventricular hypertrophy. Heart size is within normal limits. Calcific atherosclerosis is present in the left and right coronary territories. The tubular ascending thoracic aorta measures 3.4 cm. Three-vessel aortic arch with mild atherosclerotic changes in the proximal arch vessels. The descending thoracic aorta is patent with mild atherosclerotic changes. There is no aortic dissection. The main pulmonary artery is mildly dilated measuring 3.7 cm. Collateral flow is present in the right upper extremity which is likely secondary to a right upper extremity dialysis access. Mediastinum/Nodes: No mediastinal lymphadenopathy. Lungs/Pleura: Basilar scarring with mild peribronchial thickening. No pleural effusion or pneumothorax. Upper Abdomen: Reported separately. Musculoskeletal: Degenerative changes are present in the imaged osseous structures. Review of the MIP images confirms the above findings. IMPRESSION: 1. No thoracic aneurysm or dissection. 2. Coronary artery atherosclerotic vascular disease. 3. Dilation of the main pulmonary artery which can be observed in the setting of pulmonary arterial hypertension. Electronically Signed   By: Maude Naegeli M.D.   On: 11/20/2023 07:57   CT ANGIO ABDOMEN PELVIS  W & WO CONTRAST Result Date: 11/20/2023 CLINICAL DATA:  Aortic valve replacement, preoperative evaluation EXAM: CTA ABDOMEN AND PELVIS WITHOUT AND WITH CONTRAST TECHNIQUE: Multidetector CT imaging of the abdomen and pelvis was performed using the standard protocol during bolus administration of intravenous contrast. Multiplanar  reconstructed images and MIPs were obtained and reviewed to evaluate the vascular anatomy. RADIATION DOSE REDUCTION: This exam was performed according to the departmental dose-optimization program which includes automated exposure control, adjustment of the mA and/or kV according to patient size and/or use of iterative reconstruction technique. CONTRAST:  OMNIPAQUE  IOHEXOL  350 MG/ML SOLN COMPARISON:  PET-CT performed March 05, 2023 FINDINGS: VASCULAR Aorta: Mild diffuse atherosclerotic changes without abdominal aortic aneurysm. Minimal luminal diameter of the infrarenal segment measures 9 mm. Celiac: Patent. SMA: Patent. Renals: There is no significant enhancement of the right main renal artery on arterial phase imaging. The left main renal artery is small in caliber and demonstrates minimal enhancement. IMA: Mild atherosclerotic changes in the proximal segment, patent. Inflow: The right common iliac artery is patent with mild diffuse atherosclerotic changes. Minimal diameter estimated at 6 mm. Mild to moderate disease is present in the proximal right internal iliac artery. The right external iliac artery is ectatic with minimal diameter estimated at 6 mm. The left common iliac artery is patent with mild diffuse atherosclerotic changes. Minimal diameter estimated at 4.5 mm. Moderate to severe disease is present in the proximal left internal iliac artery. The left external iliac artery is ectatic with minimal diameter estimated at 7 mm. Proximal Outflow: The right  common femoral artery demonstrates posterolateral calcified plaque with minimal diameter estimated at 4.5 mm. Left common femoral artery demonstrates posterolateral calcified plaque with minimal diameter estimated at 4.5 mm. Veins: No obvious venous abnormality within the limitations of this arterial phase study. Review of the MIP images confirms the above findings. NON-VASCULAR Lower chest: Reported separately. Hepatobiliary: Cholecystectomy.  Pancreas: Unremarkable. No pancreatic ductal dilatation or surrounding inflammatory changes. Spleen: Normal in size without focal abnormality. Adrenals/Urinary Tract: The adrenal glands are grossly within normal limits. Cystic changes are present in both kidneys with underlying calcifications also present. There is no hydronephrosis. Cortical thinning is present in both kidneys with significant renal atrophy. The urinary bladder is decompressed. Stomach/Bowel: No dilated loops of bowel are seen. Lymphatic: No evidence of significant lymphadenopathy. Reproductive: .  Enlarged prostate. Other: A calcified density is present within the right pelvis which may represent sequelae of prior renal transplantation. Musculoskeletal: No acute osseous findings. IMPRESSION: 1. Minimal vessel diameters detailed above. 2. 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 3. Chronic changes in both kidneys with cysts, calcifications, and renal atrophy. Electronically Signed   By: Maude Naegeli M.D.   On: 11/20/2023 07:52     Discharge Exam: Vitals:   12/18/23 0452 12/18/23 0806  BP: (!) 144/76 (!) 157/75  Pulse: 80 83  Resp: 18 18  Temp: 97.7 F (36.5 C) 97.6 F (36.4 C)  SpO2: 99% 97%   Vitals:   12/17/23 2025 12/18/23 0009 12/18/23 0452 12/18/23 0806  BP: (!) 151/64 (!) 167/79 (!) 144/76 (!) 157/75  Pulse: 81 85 80 83  Resp: 18 18 18 18   Temp: 97.8 F (36.6 C) 98.2 F (36.8 C) 97.7 F (36.5 C) 97.6 F (36.4 C)  TempSrc: Oral Oral Oral Oral  SpO2:  100% 99% 97%  Weight:   79.9 kg   Height:        General: Pt is alert, awake, not in acute distress Cardiovascular: RRR, S1/S2 +, no rubs, no gallops Respiratory: CTA bilaterally, no wheezing, no rhonchi Abdominal: Soft, NT, ND, bowel sounds + Extremities: no edema, no cyanosis    The results of significant diagnostics from this hospitalization (including imaging, microbiology, ancillary and laboratory) are  listed below for reference.     Microbiology: Recent Results (from the past 240 hours)  Resp panel by RT-PCR (RSV, Flu A&B, Covid) Anterior Nasal Swab     Status: None   Collection Time: 12/09/23 10:21 PM   Specimen: Anterior Nasal Swab  Result Value Ref Range Status   SARS Coronavirus 2 by RT PCR NEGATIVE NEGATIVE Final   Influenza A by PCR NEGATIVE NEGATIVE Final   Influenza B by PCR NEGATIVE NEGATIVE Final    Comment: (NOTE) The Xpert Xpress SARS-CoV-2/FLU/RSV plus assay is intended as an aid in the diagnosis of influenza from Nasopharyngeal swab specimens and should not be used as a sole basis for treatment. Nasal washings and aspirates are unacceptable for Xpert Xpress SARS-CoV-2/FLU/RSV testing.  Fact Sheet for Patients: bloggercourse.com  Fact Sheet for Healthcare Providers: seriousbroker.it  This test is not yet approved or cleared by the [place]  FDA and has been authorized for detection and/or diagnosis of SARS-CoV-2 by FDA under an Emergency Use Authorization (EUA). This EUA will remain in effect (meaning this test can be used) for the duration of the COVID-19 declaration under Section 564(b)(1) of the Act, 21 U.S.C. section 360bbb-3(b)(1), unless the authorization is terminated or revoked.     Resp Syncytial Virus  by PCR NEGATIVE NEGATIVE Final    Comment: (NOTE) Fact Sheet for Patients: bloggercourse.com  Fact Sheet for Healthcare Providers: seriousbroker.it  This test is not yet approved or cleared by the [place]  FDA and has been authorized for detection and/or diagnosis of SARS-CoV-2 by FDA under an Emergency Use Authorization (EUA). This EUA will remain in effect (meaning this test can be used) for the duration of the COVID-19 declaration under Section 564(b)(1) of the Act, 21 U.S.C. section 360bbb-3(b)(1), unless the authorization is terminated  or revoked.  Performed at [facility], [address] [place], [place] [address]   Blood Culture (routine x 2)     Status: Abnormal   Collection Time: 12/09/23 10:21 PM   Specimen: BLOOD RIGHT ARM  Result Value Ref Range Status   Specimen Description BLOOD RIGHT ARM  Final   Special Requests   Final    BOTTLES DRAWN AEROBIC AND ANAEROBIC Blood Culture adequate volume   Culture  Setup Time   Final    GRAM POSITIVE COCCI IN CHAINS IN BOTH AEROBIC AND ANAEROBIC BOTTLES CRITICAL RESULT CALLED TO, READ BACK BY AND VERIFIED WITH: PHARMD J.FRENS AT 1400 ON 12/10/2023 BY T.SAAD.  Performed at [facility], [address] [place], [place] [address]    Culture STREPTOCOCCUS MITIS/ORALIS (A)  Final   Report Status 12/12/2023 FINAL  Final   Organism ID, Bacteria STREPTOCOCCUS MITIS/ORALIS  Final      Susceptibility   Streptococcus mitis/oralis - MIC*    PENICILLIN <=0.06 SENSITIVE Sensitive     CEFTRIAXONE  <=0.12 SENSITIVE Sensitive     LEVOFLOXACIN  1 SENSITIVE Sensitive     VANCOMYCIN  0.5 SENSITIVE Sensitive     * STREPTOCOCCUS MITIS/ORALIS  Blood Culture (routine x 2)     Status: Abnormal   Collection Time: 12/09/23 10:21 PM   Specimen: BLOOD RIGHT ARM  Result Value Ref Range Status   Specimen Description BLOOD RIGHT ARM  Final   Special Requests   Final    BOTTLES DRAWN AEROBIC AND ANAEROBIC Blood Culture adequate volume   Culture  Setup Time   Final    GRAM POSITIVE COCCI IN CHAINS IN BOTH AEROBIC AND ANAEROBIC BOTTLES CRITICAL VALUE NOTED.  VALUE IS CONSISTENT WITH PREVIOUSLY REPORTED AND CALLED VALUE.    Culture (A)  Final    STREPTOCOCCUS MITIS/ORALIS SUSCEPTIBILITIES PERFORMED ON PREVIOUS CULTURE WITHIN THE LAST 5 DAYS. Performed at [facility], [address], [place], [place] [address]    Report Status 12/12/2023 FINAL  Final  Blood Culture ID Panel (Reflexed)     Status: Abnormal   Collection Time: 12/09/23 10:21 PM  Result Value Ref Range Status    Enterococcus faecalis NOT DETECTED NOT DETECTED Final   Enterococcus Faecium NOT DETECTED NOT DETECTED Final   Listeria monocytogenes NOT DETECTED NOT DETECTED Final   Staphylococcus species NOT DETECTED NOT DETECTED Final   Staphylococcus aureus (BCID) NOT DETECTED NOT DETECTED Final   Staphylococcus epidermidis NOT DETECTED NOT DETECTED Final   Staphylococcus lugdunensis NOT DETECTED NOT DETECTED Final   Streptococcus species DETECTED (A) NOT DETECTED Final    Comment: Not Enterococcus species, Streptococcus agalactiae, Streptococcus pyogenes, or Streptococcus pneumoniae. CRITICAL RESULT CALLED TO, READ BACK BY AND VERIFIED WITH: PHARMD J.FRENS AT 1400 ON 12/10/2023 BY T.SAAD.     Streptococcus agalactiae NOT DETECTED NOT DETECTED Final   Streptococcus pneumoniae NOT DETECTED NOT DETECTED Final   Streptococcus pyogenes NOT DETECTED NOT DETECTED Final   A.calcoaceticus-baumannii NOT DETECTED  NOT DETECTED Final   Bacteroides fragilis NOT DETECTED NOT DETECTED Final   Enterobacterales NOT DETECTED NOT DETECTED Final   Enterobacter cloacae complex NOT DETECTED NOT DETECTED Final   Escherichia coli NOT DETECTED NOT DETECTED Final   Klebsiella aerogenes NOT DETECTED NOT DETECTED Final   Klebsiella oxytoca NOT DETECTED NOT DETECTED Final   Klebsiella pneumoniae NOT DETECTED NOT DETECTED Final   Proteus species NOT DETECTED NOT DETECTED Final   Salmonella species NOT DETECTED NOT DETECTED Final   Serratia marcescens NOT DETECTED NOT DETECTED Final   Haemophilus influenzae NOT DETECTED NOT DETECTED Final   Neisseria meningitidis NOT DETECTED NOT DETECTED Final   Pseudomonas aeruginosa NOT DETECTED NOT DETECTED Final   Stenotrophomonas maltophilia NOT DETECTED NOT DETECTED Final   Candida albicans NOT DETECTED NOT DETECTED Final   Candida auris NOT DETECTED NOT DETECTED Final   Candida glabrata NOT DETECTED NOT DETECTED Final   Candida krusei NOT DETECTED NOT DETECTED Final   Candida  parapsilosis NOT DETECTED NOT DETECTED Final   Candida tropicalis NOT DETECTED NOT DETECTED Final   Cryptococcus neoformans/gattii NOT DETECTED NOT DETECTED Final    Comment: Performed at [facility], [address], [place], [place] [address]  Culture, blood (Routine X 2) w Reflex to ID Panel     Status: None   Collection Time: 12/12/23 11:04 AM   Specimen: BLOOD RIGHT ARM  Result Value Ref Range Status   Specimen Description BLOOD RIGHT ARM  Final   Special Requests   Final    BOTTLES DRAWN AEROBIC AND ANAEROBIC Blood Culture adequate volume   Culture   Final    NO GROWTH 5 DAYS Performed at [facility], [address], [place], [place] [address]    Report Status 12/17/2023 FINAL  Final  Culture, blood (Routine X 2) w Reflex to ID Panel     Status: None   Collection Time: 12/12/23 11:04 AM   Specimen: BLOOD RIGHT ARM  Result Value Ref Range Status   Specimen Description BLOOD RIGHT ARM  Final   Special Requests   Final    BOTTLES DRAWN AEROBIC AND ANAEROBIC Blood Culture adequate volume   Culture   Final    NO GROWTH 5 DAYS Performed at [facility], [address], [place], [place] [address]    Report Status 12/17/2023 FINAL  Final     Labs: BNP (last 3 results) Recent Labs    06/19/23 1606 10/04/23 1855 12/09/23 2221  BNP 1,727.0* 2,277.3* 2,729.6*   Basic Metabolic Panel: Recent Labs  Lab 12/13/23 0412 12/15/23 1300 12/16/23 0903 12/16/23 0919 12/16/23 1415 12/16/23 1420 12/16/23 1421 12/16/23 1554 12/17/23 1214  NA 134* 133* 140 132* 131* 129* 130*  --  128*  K 4.2 4.4 3.2* 4.6 4.8 4.5 4.7  --  5.1  CL 94* 91* 107 94*  --   --   --   --  90*  CO2 25 25  --   --   --   --   --   --  25  GLUCOSE 114* 125* 72 100*  --   --   --   --  102*  BUN 37* 37* 15 23  --   --   --   --  32*  CREATININE 7.82* 9.58* 5.20* 7.20*  --   --   --  7.95* 10.00*  CALCIUM  9.1 9.2  --   --   --   --   --   --  9.4  PHOS 6.7* 7.8*  --   --   --   --   --    --  6.5*   Liver Function Tests: Recent Labs  Lab 12/13/23 0412 12/15/23 1300 12/17/23 1214  ALBUMIN  2.4* 2.6* 2.9*   No results for input(s): LIPASE, AMYLASE in the last 168 hours. No results for input(s): AMMONIA in the last 168 hours. CBC: Recent Labs  Lab 12/16/23 0401 12/16/23 0903 12/16/23 1421 12/16/23 1554 12/17/23 0457 12/17/23 1214 12/18/23 0404  WBC 5.1  --   --  4.4 6.2 5.5 6.9  HGB 8.6*   < > 8.5* 8.5* 8.7* 8.1* 9.0*  HCT 26.0*   < > 25.0* 26.2* 25.9* 24.8* 27.4*  MCV 95.2  --   --  96.0 93.5 95.0 95.8  PLT 137*  --   --  116* 136* 119* 138*   < > = values in this interval not displayed.   Cardiac Enzymes: No results for input(s): CKTOTAL, CKMB, CKMBINDEX, TROPONINI in the last 168 hours. BNP: Invalid input(s): POCBNP CBG: Recent Labs  Lab 12/12/23 0827 12/14/23 1236  GLUCAP 141* 130*   D-Dimer No results for input(s): DDIMER in the last 72 hours. Hgb A1c No results for input(s): HGBA1C in the last 72 hours. Lipid Profile No results for input(s): CHOL, HDL, LDLCALC, TRIG, CHOLHDL, LDLDIRECT in the last 72 hours. Thyroid  function studies No results for input(s): TSH, T4TOTAL, T3FREE, THYROIDAB in the last 72 hours.  Invalid input(s): FREET3 Anemia work up No results for input(s): VITAMINB12, FOLATE, FERRITIN, TIBC, IRON, RETICCTPCT in the last 72 hours. Urinalysis    Component Value Date/Time   COLORURINE YELLOW 02/19/2017 1520   APPEARANCEUR HAZY (A) 02/19/2017 1520   LABSPEC 1.012 02/19/2017 1520   PHURINE 8.0 02/19/2017 1520   GLUCOSEU 50 (A) 02/19/2017 1520   HGBUR MODERATE (A) 02/19/2017 1520   BILIRUBINUR NEGATIVE 02/19/2017 1520   KETONESUR NEGATIVE 02/19/2017 1520   PROTEINUR >=300 (A) 02/19/2017 1520   UROBILINOGEN 0.2 04/06/2014 1030   NITRITE NEGATIVE 02/19/2017 1520   LEUKOCYTESUR MODERATE (A) 02/19/2017 1520   Sepsis Labs Recent Labs  Lab 12/16/23 1554 12/17/23 0457  12/17/23 1214 12/18/23 0404  WBC 4.4 6.2 5.5 6.9   Microbiology Recent Results (from the past 240 hours)  Resp panel by RT-PCR (RSV, Flu A&B, Covid) Anterior Nasal Swab     Status: None   Collection Time: 12/09/23 10:21 PM   Specimen: Anterior Nasal Swab  Result Value Ref Range Status   SARS Coronavirus 2 by RT PCR NEGATIVE NEGATIVE Final   Influenza A by PCR NEGATIVE NEGATIVE Final   Influenza B by PCR NEGATIVE NEGATIVE Final    Comment: (NOTE) The Xpert Xpress SARS-CoV-2/FLU/RSV plus assay is intended as an aid in the diagnosis of influenza from Nasopharyngeal swab specimens and should not be used as a sole basis for treatment. Nasal washings and aspirates are unacceptable for Xpert Xpress SARS-CoV-2/FLU/RSV testing.  Fact Sheet for Patients: bloggercourse.com  Fact Sheet for Healthcare Providers: seriousbroker.it  This test is not yet approved or cleared by the [place]  FDA and has been authorized for detection and/or diagnosis of SARS-CoV-2 by FDA under an Emergency Use Authorization (EUA). This EUA will remain in effect (meaning this test can be used) for the duration of the COVID-19 declaration under Section 564(b)(1) of the Act, 21 U.S.C. section 360bbb-3(b)(1), unless the authorization is terminated or revoked.     Resp Syncytial Virus by PCR NEGATIVE NEGATIVE Final  Comment: (NOTE) Fact Sheet for Patients: bloggercourse.com  Fact Sheet for Healthcare Providers: seriousbroker.it  This test is not yet approved or cleared by the [place]  FDA and has been authorized for detection and/or diagnosis of SARS-CoV-2 by FDA under an Emergency Use Authorization (EUA). This EUA will remain in effect (meaning this test can be used) for the duration of the COVID-19 declaration under Section 564(b)(1) of the Act, 21 U.S.C. section 360bbb-3(b)(1), unless the  authorization is terminated or revoked.  Performed at [facility], [address], [place], [place] [address]   Blood Culture (routine x 2)     Status: Abnormal   Collection Time: 12/09/23 10:21 PM   Specimen: BLOOD RIGHT ARM  Result Value Ref Range Status   Specimen Description BLOOD RIGHT ARM  Final   Special Requests   Final    BOTTLES DRAWN AEROBIC AND ANAEROBIC Blood Culture adequate volume   Culture  Setup Time   Final    GRAM POSITIVE COCCI IN CHAINS IN BOTH AEROBIC AND ANAEROBIC BOTTLES CRITICAL RESULT CALLED TO, READ BACK BY AND VERIFIED WITH: PHARMD J.FRENS AT 1400 ON 12/10/2023 BY T.SAAD.  Performed at [facility], [address] [place], [place] [address]    Culture STREPTOCOCCUS MITIS/ORALIS (A)  Final   Report Status 12/12/2023 FINAL  Final   Organism ID, Bacteria STREPTOCOCCUS MITIS/ORALIS  Final      Susceptibility   Streptococcus mitis/oralis - MIC*    PENICILLIN <=0.06 SENSITIVE Sensitive     CEFTRIAXONE  <=0.12 SENSITIVE Sensitive     LEVOFLOXACIN  1 SENSITIVE Sensitive     VANCOMYCIN  0.5 SENSITIVE Sensitive     * STREPTOCOCCUS MITIS/ORALIS  Blood Culture (routine x 2)     Status: Abnormal   Collection Time: 12/09/23 10:21 PM   Specimen: BLOOD RIGHT ARM  Result Value Ref Range Status   Specimen Description BLOOD RIGHT ARM  Final   Special Requests   Final    BOTTLES DRAWN AEROBIC AND ANAEROBIC Blood Culture adequate volume   Culture  Setup Time   Final    GRAM POSITIVE COCCI IN CHAINS IN BOTH AEROBIC AND ANAEROBIC BOTTLES CRITICAL VALUE NOTED.  VALUE IS CONSISTENT WITH PREVIOUSLY REPORTED AND CALLED VALUE.    Culture (A)  Final    STREPTOCOCCUS MITIS/ORALIS SUSCEPTIBILITIES PERFORMED ON PREVIOUS CULTURE WITHIN THE LAST 5 DAYS. Performed at [facility], [address] [place], [place] [address]    Report Status 12/12/2023 FINAL  Final  Blood Culture ID Panel (Reflexed)     Status: Abnormal   Collection Time: 12/09/23 10:21 PM   Result Value Ref Range Status   Enterococcus faecalis NOT DETECTED NOT DETECTED Final   Enterococcus Faecium NOT DETECTED NOT DETECTED Final   Listeria monocytogenes NOT DETECTED NOT DETECTED Final   Staphylococcus species NOT DETECTED NOT DETECTED Final   Staphylococcus aureus (BCID) NOT DETECTED NOT DETECTED Final   Staphylococcus epidermidis NOT DETECTED NOT DETECTED Final   Staphylococcus lugdunensis NOT DETECTED NOT DETECTED Final   Streptococcus species DETECTED (A) NOT DETECTED Final    Comment: Not Enterococcus species, Streptococcus agalactiae, Streptococcus pyogenes, or Streptococcus pneumoniae. CRITICAL RESULT CALLED TO, READ BACK BY AND VERIFIED WITH: PHARMD J.FRENS AT 1400 ON 12/10/2023 BY T.SAAD.     Streptococcus agalactiae NOT DETECTED NOT DETECTED Final   Streptococcus pneumoniae NOT DETECTED NOT DETECTED Final   Streptococcus pyogenes NOT DETECTED NOT DETECTED Final   A.calcoaceticus-baumannii NOT DETECTED NOT DETECTED Final   Bacteroides fragilis NOT  DETECTED NOT DETECTED Final   Enterobacterales NOT DETECTED NOT DETECTED Final   Enterobacter cloacae complex NOT DETECTED NOT DETECTED Final   Escherichia coli NOT DETECTED NOT DETECTED Final   Klebsiella aerogenes NOT DETECTED NOT DETECTED Final   Klebsiella oxytoca NOT DETECTED NOT DETECTED Final   Klebsiella pneumoniae NOT DETECTED NOT DETECTED Final   Proteus species NOT DETECTED NOT DETECTED Final   Salmonella species NOT DETECTED NOT DETECTED Final   Serratia marcescens NOT DETECTED NOT DETECTED Final   Haemophilus influenzae NOT DETECTED NOT DETECTED Final   Neisseria meningitidis NOT DETECTED NOT DETECTED Final   Pseudomonas aeruginosa NOT DETECTED NOT DETECTED Final   Stenotrophomonas maltophilia NOT DETECTED NOT DETECTED Final   Candida albicans NOT DETECTED NOT DETECTED Final   Candida auris NOT DETECTED NOT DETECTED Final   Candida glabrata NOT DETECTED NOT DETECTED Final   Candida krusei NOT DETECTED  NOT DETECTED Final   Candida parapsilosis NOT DETECTED NOT DETECTED Final   Candida tropicalis NOT DETECTED NOT DETECTED Final   Cryptococcus neoformans/gattii NOT DETECTED NOT DETECTED Final    Comment: Performed at [facility], [address] [place], [place] [address]  Culture, blood (Routine X 2) w Reflex to ID Panel     Status: None   Collection Time: 12/12/23 11:04 AM   Specimen: BLOOD RIGHT ARM  Result Value Ref Range Status   Specimen Description BLOOD RIGHT ARM  Final   Special Requests   Final    BOTTLES DRAWN AEROBIC AND ANAEROBIC Blood Culture adequate volume   Culture   Final    NO GROWTH 5 DAYS Performed at [facility], [address], [place], [place] [address]    Report Status 12/17/2023 FINAL  Final  Culture, blood (Routine X 2) w Reflex to ID Panel     Status: None   Collection Time: 12/12/23 11:04 AM   Specimen: BLOOD RIGHT ARM  Result Value Ref Range Status   Specimen Description BLOOD RIGHT ARM  Final   Special Requests   Final    BOTTLES DRAWN AEROBIC AND ANAEROBIC Blood Culture adequate volume   Culture   Final    NO GROWTH 5 DAYS Performed at [facility], [address], [place], [place] [address]    Report Status 12/17/2023 FINAL  Final    FURTHER DISCHARGE INSTRUCTIONS:   Get Medicines reviewed and adjusted: Please take all your medications with you for your next visit with your Primary MD   Laboratory/radiological data: Please request your Primary MD to go over all hospital tests and procedure/radiological results at the follow up, please ask your Primary MD to get all Hospital records sent to his/her office.   In some cases, they will be blood work, cultures and biopsy results pending at the time of your discharge. Please request that your primary care M.D. goes through all the records of your hospital data and follows up on these results.   Also Note the following: If you experience worsening of your admission symptoms,  develop shortness of breath, life threatening emergency, suicidal or homicidal thoughts you must seek medical attention immediately by calling 911 or calling your MD immediately  if symptoms less severe.   You must read complete instructions/literature along with all the possible adverse reactions/side effects for all the Medicines you take and that have been prescribed to you. Take any new Medicines after you have completely understood and accpet all the possible adverse reactions/side effects.  patient was instructed, not to drive, operate heavy machinery, perform activities at heights, swimming or participation in water  activities or provide baby-sitting services while on Pain, Sleep and Anxiety Medications; until their outpatient Physician has advised to do so again. Also recommended to not to take more than prescribed Pain, Sleep and Anxiety Medications.  It is not advisable to combine anxiety, sleep and pain medications without talking with your primary care provider.     Wear Seat belts while driving.   Please note: You were cared for by a hospitalist during your hospital stay. Once you are discharged, your primary care physician will handle any further medical issues. Please note that NO REFILLS for any discharge medications will be authorized once you are discharged, as it is imperative that you return to your primary care physician (or establish a relationship with a primary care physician if you do not have one) for your post hospital discharge needs so that they can reassess your need for medications and monitor your lab values  Time coordinating discharge: Over 30 minutes  SIGNED:   Fredia Skeeter, MD  [facility] 12/18/2023, 11:45 AM *Please note that this is a verbal dictation therefore any spelling or grammatical errors are due to the Dragon Medical One system interpretation. If 7PM-7AM, please contact night-coverage [email or web address]

## 2023-12-18 NOTE — Progress Notes (Addendum)
 D/c orders noted. Contacted [facility] to inform of pt d/c and anticipated arrival back. Reminded of iv abx. No further support needed at this time.   Lavanda Eveleigh Crumpler Dialysis Navigator [phone]

## 2023-12-18 NOTE — Discharge Planning (Signed)
 [facility] Patient Discharge Orders- [facility] CL[facility]IC: [facility]  Patient's name: Kirk [name] Admit/DC Dates: 12/09/2023 -   Discharge Diagnoses: NSTEMI / HFrEF  Strep mitis bacteremia  Aranesp : Given: none   Date and amount of last dose: n/a Last Hgb: 9.0 PRBC's Given: yes Date/# of units: 1 unit PRBC ESA dose for discharge: mircera 75 mcg IV q 2 weeks -due next HD IV Iron dose at discharge: none  Heparin  change: no  EDW Change: yes New EDW: 80kg  [place]ath Change: no  Access intervention/Change: no Details:  Hectorol /Calcitriol  change: no  Discharge Labs: Calcium  9.4 Phosphorus 6.5 Albumin  2.9 K+ 5.1  IV Antibiotics: Yes Details: cefazolin  2g IV q HD for one dose on Friday 12/20/23  On Coumadin?: no Last INR: Next INR: Managed By:   OTHER/APPTS/LAB ORDERS:    D/C Meds to be reconciled by nurse after every discharge.  Completed By: Lucie Collet, PA-C 12/18/2023, 12:48 PM  [facility] Pager: [phone]   Reviewed by: MD:______ RN_______

## 2023-12-18 NOTE — Plan of Care (Signed)
 Discharge instructions discussed with patient.  Patient instructed on home medications, restrictions, and follow up appointments. Belongings gathered and sent with patient.  Patients medications to be picked up from [facility].    Plan to send patient to discharge lounge.

## 2023-12-20 ENCOUNTER — Telehealth: Payer: Self-pay

## 2023-12-20 DIAGNOSIS — Z992 Dependence on renal dialysis: Secondary | ICD-10-CM | POA: Diagnosis not present

## 2023-12-20 DIAGNOSIS — D631 Anemia in chronic kidney disease: Secondary | ICD-10-CM | POA: Diagnosis not present

## 2023-12-20 DIAGNOSIS — N186 End stage renal disease: Secondary | ICD-10-CM | POA: Diagnosis not present

## 2023-12-20 DIAGNOSIS — D509 Iron deficiency anemia, unspecified: Secondary | ICD-10-CM | POA: Diagnosis not present

## 2023-12-20 DIAGNOSIS — R7881 Bacteremia: Secondary | ICD-10-CM | POA: Diagnosis not present

## 2023-12-20 DIAGNOSIS — N2581 Secondary hyperparathyroidism of renal origin: Secondary | ICD-10-CM | POA: Diagnosis not present

## 2023-12-20 NOTE — Transitions of Care (Post Inpatient/ED Visit) (Signed)
   12/20/2023  Name: Kirk [name] MRN: [id number] DOB: [birth year]  Today's TOC FU Call Status: Today's TOC FU Call Status:: Unsuccessful Call (1st Attempt) Unsuccessful Call (1st Attempt) Date: 12/20/23  Attempted to reach the patient regarding the most recent Inpatient/ED visit.  Follow Up Plan: Additional outreach attempts will be made to reach the patient to complete the Transitions of Care (Post Inpatient/ED visit) call.   Sheronda Parran J. Daneka Lantigua RN, MSN [facility], [facility] RN Care Manager Direct Dial : [phone]  Fax: [phone] Website: [email or web address]

## 2023-12-23 ENCOUNTER — Telehealth: Payer: Self-pay

## 2023-12-23 NOTE — Transitions of Care (Post Inpatient/ED Visit) (Signed)
   12/23/2023  Name: JACKSTON OAXACA MRN: [id number] DOB: 01/10/1961  Today's TOC FU Call Status: Today's TOC FU Call Status:: Unsuccessful Call (2nd Attempt) Unsuccessful Call (2nd Attempt) Date: 12/23/23  Attempted to reach the patient regarding the most recent Inpatient/ED visit.  Follow Up Plan: Additional outreach attempts will be made to reach the patient to complete the Transitions of Care (Post Inpatient/ED visit) call.   Shona Prow RN, CCM [place]  [facility] RN Care Manager [phone]

## 2023-12-24 ENCOUNTER — Telehealth: Payer: Self-pay

## 2023-12-24 NOTE — Transitions of Care (Post Inpatient/ED Visit) (Signed)
   12/24/2023  Name: Kirk [name] MRN: [id number] DOB: 04-01-1960  Today's TOC FU Call Status: Today's TOC FU Call Status:: Unsuccessful Call (3rd Attempt) Unsuccessful Call (3rd Attempt) Date: 12/24/23  Attempted to reach the patient regarding the most recent Inpatient/ED visit.  Follow Up Plan: No further outreach attempts will be made at this time. We have been unable to contact the patient.  Shona Prow RN, CCM [place]  [facility]Population [facility] RN Care Manager [phone]

## 2024-01-24 NOTE — Progress Notes (Signed)
 "  Patient ID: Kirk [name] MRN: [id number] DOB/AGE: 62/01/1960 62 y.o.  Primary Care Physician:Ngetich, Roxan [name], NP Primary Cardiologist: Hilty  CC:  Aortic valvular disease management     FOCUSED PROBLEM LIST:   Aortic stenosis AVA 1.04, SVI 35, DI 0.27, MG 20, V-max 3.1, EF 25 to 30% TTE September 2025 EKG sinus tachycardia with LVH with nonspecific ST and T wave changes CAD Minimal; coronary angiography September 2025 ACS/NSTEMI secondary to strep mitis bacteremia November 2025  Minimal, coronary angiography November 2025  ESRD on HD MWFri On HD since ~2010 Hypertension Hyperlipidemia Aortic atherosclerosis Chest CT 2024 BMI 29/BSA 2.0  October 2025:  Patient consents to use of AI scribe. The patient is a 64 year old male with the above listed medical problems referred for recommendations regarding his aortic valvular disease.  He was most recently admitted to [facility] in September due to an abnormal EKG and low-grade fever.  Angiography was reassuring.  Viral panel was positive for rhinovirus.  Echocardiogram demonstrated low-flow low gradient aortic stenosis with an ejection fraction of 25 to 30%.  He was seen in outpatient follow-up few weeks ago.  At that point in time he was doing well and was without complaints.  He undergoes dialysis on Monday, Wednesday, and Friday, but experiences frequent interruptions due to hypotension, occurring with every treatment for the past two to three months. His weight has been fluctuating during this period.  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 He often needs to stop and rest, sometimes requiring the use of supplemental oxygen . He has not been able to perform these activities as needed for over a year.  He reports experiencing chest pain, notably a severe episode last night while lying down and watching TV. He describes the sensation as a 'heartbeat'  that he 'hates'.  He occasionally experiences lightheadedness or dizziness, particularly when transitioning from sitting to standing quickly. He also sometimes experiences presyncope or blacking out spells at home.  He uses a CPAP machine to assist with breathing while sleeping. He has been on dialysis for 15 years, starting in 2010, and uses a fistula for the procedure.  He has no teeth.  Plan: For for CTA and cardiothoracic surgical evaluation  January 2026:  Patient consents to use of AI scribe. In the interim, he was admitted with chest pain and shortness of breath in November.  Coronary angiography again demonstrated reassuring findings.  His symptoms were thought to be due to strep mitis bacteremia.  A TEE demonstrated no endocarditis.  He is here for follow-up discussion regarding his aortic valvular disease.   He visited the emergency department yesterday due to hematuria, which has since cleared up. He mentions feeling 'pretty good' but notes that he is still moving slowly as he recovers.  He continues to experience exertional dyspnea during activities such as yard work and house cleaning, requiring him to stop and rest. No lightheadedness or syncope. He denies any episodes of low blood pressure during dialysis that require stopping the procedure.  He is undergoing dialysis and reports issues with his dry weight, stating that he is 'pulling too much off', which he manages by stopping the dialysis when necessary.  No recent fevers or chills and no orthopnea, although exertion can exacerbate his breathing difficulties.         Past Medical History:  Diagnosis Date   Acute edema of lung, unspecified    Acute, but  ill-defined, cerebrovascular disease    Allergy    Anemia    Anemia in chronic kidney disease(285.21)    Anxiety    Asthma    Asthma    moderate persistent   Carpal tunnel syndrome    Cellulitis and abscess of trunk    Cholelithiasis 07/13/2014   Chronic  headaches    Cigarette smoker 07/11/2017   Stopped 2/022  - referred to start Lung cancer screening 04/2021         Debility, unspecified    Dermatophytosis of the body    Dysrhythmia    history of   Edema    End stage renal disease on dialysis [facility])    MWF; [facility] in [place] (10/21/2014)   Essential hypertension, benign    Generalized anxiety disorder 05/25/2022   GERD (gastroesophageal reflux disease)    Gout, unspecified    History of renal transplant    HTN (hypertension)    Hypertrophy of prostate without urinary obstruction and other lower urinary tract symptoms (LUTS)    Hypotension, unspecified    Hypoxia 03/21/2023   Impotence of organic origin    Insomnia, unspecified    Localization-related (focal) (partial) epilepsy and epileptic syndromes with complex partial seizures, without mention of intractable epilepsy    12-15-19- Wife states he has NEVER had a seizure    Lumbago    Memory loss    OSA on CPAP    Other and unspecified hyperlipidemia    controlled /managed per wife    Other chronic nonalcoholic liver disease    Other malaise and fatigue    Other nonspecific abnormal serum enzyme levels    Pain in joint, lower leg    Pain in joint, upper arm    Pneumonia several times   PONV (postoperative nausea and vomiting)    Secondary hyperparathyroidism (of renal origin)    Shortness of breath    Sleep apnea    wears cpap    Tension headache    Unspecified constipation    Unspecified essential hypertension    Unspecified hereditary and idiopathic peripheral neuropathy    Unspecified vitamin D  deficiency     Past Surgical History:  Procedure Laterality Date   A/V FISTULAGRAM N/A 03/28/2023   Procedure: A/V Fistulagram;  Surgeon: Melia Lynwood ORN, MD;  Location: [facility] INVASIVE CV LAB;  Service: Cardiovascular;  Laterality: N/A;   A/V FISTULAGRAM Left 11/07/2023   Procedure: A/V Fistulagram;  Surgeon: Serene Gaile ORN, MD;  Location: [facility] PV LAB;  Service:  Cardiovascular;  Laterality: Left;   AV FISTULA PLACEMENT Left ?2010   forearm; at [facility]   BACK SURGERY     CARDIAC CATHETERIZATION  03/21/2011   CHOLECYSTECTOMY N/A 10/21/2014   Procedure: LAPAROSCOPIC CHOLECYSTECTOMY WITH INTRAOPERATIVE CHOLANGIOGRAM;  Surgeon: Deward Null III, MD;  Location: [facility] OR;  Service: General;  Laterality: N/A;   COLONOSCOPY     ESOPHAGOGASTRODUODENOSCOPY N/A 06/21/2023   Procedure: EGD (ESOPHAGOGASTRODUODENOSCOPY);  Surgeon: Legrand Victory LITTIE DOUGLAS, MD;  Location: [facility] ENDOSCOPY;  Service: Gastroenterology;  Laterality: N/A;   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   IR ANGIO INTRA EXTRACRAN SEL INTERNAL CAROTID UNI R MOD SED  11/30/2021   IR ANGIOGRAM FOLLOW UP STUDY  11/30/2021   IR AV DIALY SHUNT INTRO NEEDLE/INTRACATH INITIAL W/PTA/IMG LEFT  12/13/2022   IR BONE MARROW BIOPSY & ASPIRATION  03/27/2022   IR NEURO EACH ADD'L AFTER BASIC UNI RIGHT (MS)  11/30/2021   IR TRANSCATH/EMBOLIZ  11/30/2021  IR US  GUIDE VASC ACCESS LEFT  12/13/2022   KIDNEY TRANSPLANT  08/17/2011   [facility]    LAPAROSCOPIC CHOLECYSTECTOMY  10/21/2014   w/IOC   LEFT HEART CATH AND CORONARY ANGIOGRAPHY N/A 04/09/2023   Procedure: LEFT HEART CATH AND CORONARY ANGIOGRAPHY;  Surgeon: Anner Alm ORN, MD;  Location: [facility] INVASIVE CV LAB;  Service: Cardiovascular;  Laterality: N/A;   LEFT HEART CATH AND CORONARY ANGIOGRAPHY N/A 10/04/2023   Procedure: LEFT HEART CATH AND CORONARY ANGIOGRAPHY;  Surgeon: Anner Alm ORN, MD;  Location: [facility] INVASIVE CV LAB;  Service: Cardiovascular;  Laterality: N/A;   LEFT HEART CATHETERIZATION WITH CORONARY ANGIOGRAM N/A 03/21/2011   Procedure: LEFT HEART CATHETERIZATION WITH CORONARY ANGIOGRAM;  Surgeon: Vinie KYM Maxcy, MD;  Location: [facility] CATH LAB;  Service: Cardiovascular;  Laterality: N/A;   NEPHRECTOMY  08/2013   removed transplaned kidney   PERIPHERAL VASCULAR BALLOON ANGIOPLASTY Left 03/28/2023   Procedure: PERIPHERAL VASCULAR BALLOON  ANGIOPLASTY;  Surgeon: Melia Lynwood ORN, MD;  Location: [facility] INVASIVE CV LAB;  Service: Cardiovascular;  Laterality: Left;  80% outflow cephalic   POLYPECTOMY     POSTERIOR FUSION CERVICAL SPINE  06/25/2012   for spinal stenosis   RADIOLOGY WITH ANESTHESIA N/A 11/30/2021   Procedure: RIGHT MIDDLE MENINGEAL ARTERY EMBOLIZATION;  Surgeon: Radiologist, Medication, MD;  Location: [facility] OR;  Service: Radiology;  Laterality: N/A;   RIGHT/LEFT HEART CATH AND CORONARY ANGIOGRAPHY N/A 12/16/2023   Procedure: RIGHT/LEFT HEART CATH AND CORONARY ANGIOGRAPHY;  Surgeon: Anner Alm ORN, MD;  Location: [facility] INVASIVE CV LAB;  Service: Cardiovascular;  Laterality: N/A;   TRANSESOPHAGEAL ECHOCARDIOGRAM (CATH LAB) N/A 12/16/2023   Procedure: TRANSESOPHAGEAL ECHOCARDIOGRAM;  Surgeon: Raford Riggs, MD;  Location: [facility] INVASIVE CV LAB;  Service: Cardiovascular;  Laterality: N/A;   VASECTOMY  2010   VENOUS ANGIOPLASTY  11/07/2023   Procedure: VENOUS ANGIOPLASTY;  Surgeon: Serene Gaile ORN, MD;  Location: [facility] PV LAB;  Service: Cardiovascular;;  cephalic vein    Family History  Adopted: Yes  Problem Relation Age of Onset   Colon cancer Neg Hx    Esophageal cancer Neg Hx    Rectal cancer Neg Hx    Stomach cancer Neg Hx    Colon polyps Neg Hx     Social History   Socioeconomic History   Marital status: Married    Spouse name: Edna   Number of children: 3   Years of education: Not on file   Highest education level: Not on file  Occupational History   Occupation: disabled    Employer: DISABLED  Tobacco Use   Smoking status: Former    Current packs/day: 0.00    Average packs/day: 0.5 packs/day for 32.0 years (16.0 ttl pk-yrs)    Types: Cigarettes    Start date: 88    Quit date: 02/24/2020    Years since quitting: 3.9   Smokeless tobacco: Never  Vaping Use   Vaping status: Never Used  Substance and Sexual Activity   Alcohol use: No    Alcohol/week: 0.0 standard drinks of alcohol   Drug use: No   Sexual  activity: Yes  Other Topics Concern   Not on file  Social History Narrative   Drinks 1 cup of caffeine daily.   Social Drivers of Health   Tobacco Use: Medium Risk (01/30/2024)   Patient History    Smoking Tobacco Use: Former    Smokeless Tobacco Use: Never    Passive Exposure: Not on file  Financial Resource Strain: Medium Risk (11/02/2021)  Overall Financial Resource Strain (CARDIA)    Difficulty of Paying Living Expenses: Somewhat hard  Food Insecurity: Food Insecurity Present (12/11/2023)   Epic    Worried About Programme Researcher, Broadcasting/film/video in the Last Year: Often true    Barista in the Last Year: Often true  Transportation Needs: Unmet Transportation Needs (12/11/2023)   Epic    Lack of Transportation (Medical): Yes    Lack of Transportation (Non-Medical): Yes  Physical Activity: Not on file  Stress: Not on file  Social Connections: Socially Isolated (12/11/2023)   Social Connection and Isolation Panel    Frequency of Communication with Friends and Family: Once a week    Frequency of Social Gatherings with Friends and Family: Never    Attends Religious Services: Never    Database Administrator or Organizations: No    Attends Banker Meetings: Never    Marital Status: Married  Catering Manager Violence: Not At Risk (12/11/2023)   Epic    Fear of Current or Ex-Partner: No    Emotionally Abused: No    Physically Abused: No    Sexually Abused: No  Depression (PHQ2-9): Low Risk (01/29/2023)   Depression (PHQ2-9)    PHQ-2 Score: 0  Alcohol Screen: Not on file  Housing: Low Risk (12/11/2023)   Epic    Unable to Pay for Housing in the Last Year: No    Number of Times Moved in the Last Year: 0    Homeless in the Last Year: No  Utilities: At Risk (12/11/2023)   Epic    Threatened with loss of utilities: Yes  Health Literacy: Not on file     Prior to Admission medications   Medication Sig Start Date End Date Taking? Authorizing Provider  acetaminophen   (TYLENOL ) 500 MG tablet Take 1 tablet (500 mg total) by mouth every 6 (six) hours as needed for moderate pain. Patient taking differently: Take 1,000 mg by mouth every 6 (six) hours as needed for moderate pain (pain score 4-6) or headache. 09/12/14   Jadine Toribio SQUIBB, MD  albuterol  (PROVENTIL ) (2.5 MG/3ML) 0.083% nebulizer solution Take 3 mLs (2.5 mg total) by nebulization every 4 (four) hours as needed for wheezing or shortness of breath. 06/25/23   Elgergawy, Brayton RAMAN, MD  albuterol  (VENTOLIN  HFA) 108 (90 Base) MCG/ACT inhaler Inhale 2 puffs into the lungs every 6 (six) hours as needed for wheezing or shortness of breath. 10/29/22   Darlean Ozell NOVAK, MD  aspirin  EC 81 MG tablet Take 81 mg by mouth daily in the afternoon.    [provider]  Cholecalciferol  (VITAMIN D3) 25 MCG (1000 UT) CAPS Take 4,000 Units by mouth See admin instructions. Patient takes Earlean Everts, Sat, Sun    [provider]  doxazosin  (CARDURA ) 4 MG tablet Take 1 tablet (4 mg total) by mouth at bedtime. 05/31/23   Walker, Caitlin S, NP  fluticasone -salmeterol (ADVAIR HFA) 45-21 MCG/ACT inhaler  07/05/23   Darlean Ozell NOVAK, MD  guaiFENesin  (MUCINEX ) 600 MG 12 hr tablet Take 1,200 mg by mouth 2 (two) times daily as needed for cough or to loosen phlegm.    [provider]  hydrALAZINE  (APRESOLINE ) 50 MG tablet Take 1 tablet (50 mg total) by mouth 3 (three) times daily. 10/16/23 01/14/24  Meng, Hao, PA  ipratropium-albuterol  (DUONEB) 0.5-2.5 (3) MG/3ML SOLN Take 3 mLs by nebulization every 6 (six) hours. Please take every 6 hours scheduled for next 5 days then as needed. 06/25/23  Elgergawy, Brayton RAMAN, MD  isosorbide  mononitrate (IMDUR ) 60 MG 24 hr tablet Take 60 mg by mouth daily. 10/04/22   [provider]  metoprolol  succinate (TOPROL -XL) 25 MG 24 hr tablet Take 1 tablet (25 mg total) by mouth daily. 10/07/23 11/06/23  Pahwani, Ravi, MD  multivitamin (RENA-VIT) TABS tablet Take 1 tablet by mouth  daily. Patient taking differently: Take 1 tablet by mouth See admin instructions. Take 1 tablet by mouth once daily in the afternoon. 01/29/23   Ngetich, Roxan [name], NP  OXYGEN  Inhale into the lungs. 2 L  cont.    [provider]  pravastatin  (PRAVACHOL ) 40 MG tablet TAKE 1 TABLET BY MOUTH EVERY DAY IN THE EVENING Patient taking differently: Take 40 mg by mouth at bedtime. TAKE 1 TABLET BY MOUTH EVERY DAY IN THE EVENING 12/04/22   Ngetich, Dinah C, NP  sevelamer  carbonate (RENVELA ) 2.4 g PACK Take 4.8 g by mouth 3 (three) times daily with meals. Also takes with his snacks    [provider]  sucroferric oxyhydroxide (VELPHORO ) 500 MG chewable tablet Chew 1,000-1,500 mg by mouth See admin instructions. Take 3 tablets (1500mg ) by mouth three times daily with meals and take 2 tablets (1000mg ) with snacks.    [provider]  traMADol  (ULTRAM ) 50 MG tablet Take 50 mg by mouth daily as needed for severe pain (pain score 7-10).    [provider]  traZODone  (DESYREL ) 50 MG tablet TAKE 0.5-1 TABLET (25-50 MG TOTAL) BY MOUTH AT BEDTIME AS NEEDED. FOR SLEEP Patient taking differently: Take 50 mg by mouth at bedtime. 04/08/23   Ngetich, Dinah C, NP  UNABLE TO FIND Inhale 1 Device into the lungs at bedtime. CPAP    [provider]    Allergies  Allergen Reactions   Codeine Nausea And Vomiting   Lortab [Hydrocodone -Acetaminophen ] Nausea And Vomiting    REVIEW OF SYSTEMS:  General: no fevers/chills/night sweats Eyes: no blurry vision, diplopia, or amaurosis ENT: no sore throat or hearing loss Resp: no cough, wheezing, or hemoptysis CV: no edema or palpitations GI: no abdominal pain, nausea, vomiting, diarrhea, or constipation GU: no dysuria, frequency, or hematuria Skin: no rash Neuro: no headache, numbness, tingling, or weakness of extremities Musculoskeletal: no joint pain or swelling Heme: no bleeding, DVT, or easy bruising Endo: no polydipsia or  polyuria  BP (!) 180/83 (BP Location: Right Arm, Patient Position: Sitting, Cuff Size: Normal)   Pulse 69   Resp 17   Ht 5' 7 (1.702 m)   Wt 176 lb (79.8 kg)   SpO2 97%   BMI 27.57 kg/m   PHYSICAL EXAM: GEN:  AO x 3 in no acute distress HEENT: normal Dentition: No teeth Neck: JVP normal. +2 carotid upstrokes without bruits. No thyromegaly. Lungs: equal expansion, clear bilaterally CV: Apex is discrete and nondisplaced, RRR with 3 out of 6 systolic ejection murmur Abd: soft, non-tender, non-distended; no bruit; positive bowel sounds Ext: no edema, ecchymoses, or cyanosis Vascular: 2+ femoral pulses, 2+ radial pulses; left forearm fistula intact      Skin: warm and dry without rash Neuro: CN II-XII grossly intact; motor and sensory grossly intact    DATA AND STUDIES:  EKG: 2025 normal sinus rhythm, nonspecific ST and T wave changes, likely LVH  EKG Interpretation Date/Time:    Ventricular Rate:    PR Interval:    QRS Duration:    QT Interval:    QTC Calculation:   R Axis:      Text  Interpretation:          CARDIAC STUDIES: Refer to CV Procedures and Imaging Tabs  11/15/2023: ALT 16 12/09/2023: B Natriuretic Peptide 2,729.6 12/10/2023: Magnesium  1.9 12/17/2023: BUN 32; Creatinine, Ser 10.00; Potassium 5.1; Sodium 128 12/18/2023: Hemoglobin 9.0; Platelets 138   STS RISK CALCULATOR: Pending  NYHA CLASS: 2    ASSESSMENT AND PLAN:   1. 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 Nonrheumatic aortic valve stenosis   3. NICM (nonischemic cardiomyopathy) (HCC)   4. Mild CAD   5. ESRD (end stage renal disease) (HCC)   6. Essential hypertension   7. Hyperlipidemia LDL goal <70   8. Aortic atherosclerosis      Strep mitis bacteremia: Will draw blood cultures today to ensure clearance of infection. Low-flow low gradient aortic stenosis: He endorses NYHA II symptoms of dyspnea.  On occasion dialysis has had to be interrupted.  Evaluation for aortic valve  intervention was interrupted by strep mitis bacteremia.  He has no teeth and we will defer a dental evaluation.  He had coronary angiography in the hospital which demonstrated mild nonobstructive disease.  His CT scan demonstrates anatomy feasible for 26 mm SAPIEN 3 valve.  His BSA is 2.  I believe with his end-stage renal disease and high flow state this may exacerbate any possible patient prosthesis mismatch.  A Medtronic valve may give us  a better hemodynamic profile however I do not see that the membranous septum length has been reported on the CT scan.  I would like to review this in more detail.  If the membranous septum is quite long and his risk for heart block and a pacemaker is very low and I think a Medtronic valve would be best.  If he is at higher risk for requiring a pacemaker then I think we should proceed with a 26 mm SAPIEN 3 valve if TAVR is pursued. Nonischemic cardiomyopathy: Continue hydralazine  50 mg 3 times daily, Imdur  60 mg daily, Toprol  25 mg daily; defer SGLT2 inhibitor and Entresto. Mild CAD: Continue aspirin  81 mg, pravastatin  40 mg End-stage renal disease: Managed by other providers.  Has been on dialysis for about 15 years with a single fistula. Hypertension: Continue doxazosin  4 mg daily, Imdur  60 mg daily, hydralazine  50 mg 3 times daily, Toprol  25 mg daily Hyperlipidemia: Continue pravastatin  40 mg daily Aortic atherosclerosis: Continue aspirin  81 mg, pravastatin  40 mg   I have personally reviewed the patients imaging data as summarized above.  I have reviewed the natural history of aortic stenosis with the patient and family members who are present today. We have discussed the limitations of medical therapy and the poor prognosis associated with symptomatic aortic stenosis. We have also reviewed potential treatment options, including palliative medical therapy, conventional surgical aortic valve replacement, and transcatheter aortic valve replacement. We discussed  treatment options in the context of this patient's specific comorbid medical conditions.   All of the patient's questions were answered today. Will make further recommendations based on the results of studies outlined above.   I spent 42 minutes reviewing all clinical data during and prior to this visit including all relevant imaging studies, laboratories, clinical information from other health systems and prior notes from both Cardiology and other specialties, interviewing the patient, conducting a complete physical examination, and coordinating care in order to formulate a comprehensive and personalized evaluation and treatment plan.   Jaivon Vanbeek K Cavin Longman, MD  01/30/2024 10:04 AM    [facility] [address] [place], [place]  [address] Phone: [phone]; Fax: [phone]     "

## 2024-01-29 ENCOUNTER — Encounter (HOSPITAL_COMMUNITY): Payer: Self-pay

## 2024-01-29 ENCOUNTER — Emergency Department (HOSPITAL_COMMUNITY)
Admission: EM | Admit: 2024-01-29 | Discharge: 2024-01-30 | Disposition: A | Discharge status: Home / Self Care | Attending: Emergency Medicine | Admitting: Emergency Medicine

## 2024-01-29 ENCOUNTER — Other Ambulatory Visit: Payer: Self-pay

## 2024-01-29 DIAGNOSIS — E785 Hyperlipidemia, unspecified: Secondary | ICD-10-CM | POA: Diagnosis present

## 2024-01-29 DIAGNOSIS — F17210 Nicotine dependence, cigarettes, uncomplicated: Secondary | ICD-10-CM | POA: Insufficient documentation

## 2024-01-29 DIAGNOSIS — I700 Atherosclerosis of aorta: Secondary | ICD-10-CM | POA: Diagnosis present

## 2024-01-29 DIAGNOSIS — J45909 Unspecified asthma, uncomplicated: Secondary | ICD-10-CM | POA: Insufficient documentation

## 2024-01-29 DIAGNOSIS — I120 Hypertensive chronic kidney disease with stage 5 chronic kidney disease or end stage renal disease: Secondary | ICD-10-CM | POA: Insufficient documentation

## 2024-01-29 DIAGNOSIS — R310 Gross hematuria: Secondary | ICD-10-CM | POA: Insufficient documentation

## 2024-01-29 DIAGNOSIS — I2510 Atherosclerotic heart disease of native coronary artery without angina pectoris: Secondary | ICD-10-CM | POA: Diagnosis present

## 2024-01-29 DIAGNOSIS — N186 End stage renal disease: Secondary | ICD-10-CM | POA: Insufficient documentation

## 2024-01-29 DIAGNOSIS — I428 Other cardiomyopathies: Secondary | ICD-10-CM | POA: Diagnosis present

## 2024-01-29 DIAGNOSIS — A491 Streptococcal infection, unspecified site: Secondary | ICD-10-CM | POA: Diagnosis present

## 2024-01-29 DIAGNOSIS — Z992 Dependence on renal dialysis: Secondary | ICD-10-CM | POA: Diagnosis not present

## 2024-01-29 DIAGNOSIS — I10 Essential (primary) hypertension: Secondary | ICD-10-CM | POA: Diagnosis present

## 2024-01-29 DIAGNOSIS — I350 Nonrheumatic aortic (valve) stenosis: Secondary | ICD-10-CM | POA: Diagnosis present

## 2024-01-29 DIAGNOSIS — R319 Hematuria, unspecified: Secondary | ICD-10-CM | POA: Diagnosis present

## 2024-01-29 NOTE — ED Provider Triage Note (Signed)
 Emergency Medicine Provider Triage Evaluation Note  Kirk [name] , a 64 y.o. male  was evaluated in triage.  Pt complains of hematuria that began this morning.  Had pain prior to blood in urine.  Patient does not normally produce urine.  Review of Systems  Positive: Hematuria, dysuria Negative: Fever, chills, nausea, vomiting  Physical Exam  BP (!) 151/67   Pulse 77   Temp 97.8 F (36.6 C) (Oral)   Resp 18   Wt 79.8 kg   SpO2 100%   BMI 27.57 kg/m  Gen:   Awake, no distress   Resp:  Normal effort  MSK:   Moves extremities without difficulty  Other:    Medical Decision Making  Medically screening exam initiated at 4:18 PM.  Appropriate orders placed.  Kirk [name] was informed that the remainder of the evaluation will be completed by another provider, this initial triage assessment does not replace that evaluation, and the importance of remaining in the ED until their evaluation is complete.  Labs ordered   Francis Ileana SAILOR, PA-C 01/29/24 1622

## 2024-01-29 NOTE — ED Triage Notes (Signed)
 Patient states that he has blood in his urine starting this morning. He states that he went to dialysis and did not receive a full treatment. The nurses at the dialysis facility sent him to the ED for evaluation. He had pain before he started bleeding but not he does not have any pain.

## 2024-01-30 ENCOUNTER — Encounter: Payer: Self-pay | Admitting: Internal Medicine

## 2024-01-30 ENCOUNTER — Ambulatory Visit: Admitting: Internal Medicine

## 2024-01-30 ENCOUNTER — Emergency Department (HOSPITAL_COMMUNITY)

## 2024-01-30 VITALS — BP 180/83 | HR 69 | Resp 17 | Ht 67.0 in | Wt 176.0 lb

## 2024-01-30 DIAGNOSIS — E785 Hyperlipidemia, unspecified: Secondary | ICD-10-CM

## 2024-01-30 DIAGNOSIS — I10 Essential (primary) hypertension: Secondary | ICD-10-CM

## 2024-01-30 DIAGNOSIS — I2510 Atherosclerotic heart disease of native coronary artery without angina pectoris: Secondary | ICD-10-CM | POA: Diagnosis not present

## 2024-01-30 DIAGNOSIS — I350 Nonrheumatic aortic (valve) stenosis: Secondary | ICD-10-CM

## 2024-01-30 DIAGNOSIS — I428 Other cardiomyopathies: Secondary | ICD-10-CM

## 2024-01-30 DIAGNOSIS — N186 End stage renal disease: Secondary | ICD-10-CM

## 2024-01-30 DIAGNOSIS — A491 Streptococcal infection, unspecified site: Secondary | ICD-10-CM | POA: Diagnosis not present

## 2024-01-30 DIAGNOSIS — I700 Atherosclerosis of aorta: Secondary | ICD-10-CM | POA: Diagnosis not present

## 2024-01-30 NOTE — ED Provider Notes (Signed)
 "  Emergency Department Provider Note   I have reviewed the triage vital signs and the nursing notes.   HISTORY  Chief Complaint Hematuria   HPI Kirk [name] is a 64 y.o. male with past history reviewed below including ESRD presents to the emergency department with hematuria.  He states that this has been an intermittent issue for several months.  Today, he had more blood in the urine and some fairly severe burning pain just prior to urination.  He states he does not urinate often.  He went to dialysis and had a 3-hour session.  Afterwards, he presented to the ED for further evaluation.  He is not having any pain in the abdomen or flanks. No fever or chills.    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)    Anxiety    Asthma    Asthma    moderate persistent   Carpal tunnel syndrome    Cellulitis and abscess of trunk    Cholelithiasis 07/13/2014   Chronic headaches    Cigarette smoker 07/11/2017   Stopped 2/022  - referred to start Lung cancer screening 04/2021         Debility, unspecified    Dermatophytosis of the body    Dysrhythmia    history of   Edema    End stage renal disease on dialysis (HCC)    MWF; [facility] in [place] (10/21/2014)   Essential hypertension, benign    Generalized anxiety disorder 05/25/2022   GERD (gastroesophageal reflux disease)    Gout, unspecified    History of renal transplant    HTN (hypertension)    Hypertrophy of prostate without urinary obstruction and other lower urinary tract symptoms (LUTS)    Hypotension, unspecified    Hypoxia 03/21/2023   Impotence of organic origin    Insomnia, unspecified    Localization-related (focal) (partial) epilepsy and epileptic syndromes with complex partial seizures, without mention of intractable epilepsy    12-15-19- Wife states he has  NEVER had a seizure    Lumbago    Memory loss    OSA on CPAP    Other and unspecified hyperlipidemia    controlled /managed per wife    Other chronic nonalcoholic liver disease    Other malaise and fatigue    Other nonspecific abnormal serum enzyme levels    Pain in joint, lower leg    Pain in joint, upper arm    Pneumonia several times   PONV (postoperative nausea and vomiting)    Secondary hyperparathyroidism (of renal origin)    Shortness of breath    Sleep apnea    wears cpap    Tension headache    Unspecified constipation    Unspecified essential hypertension    Unspecified hereditary and idiopathic peripheral neuropathy    Unspecified vitamin D  deficiency     Review of Systems  Constitutional: No fever/chills Cardiovascular: Denies chest pain. Respiratory: Denies shortness of breath. Gastrointestinal: No abdominal pain.  No nausea, no vomiting.   Genitourinary: Positive dysuria and hematuria.  Musculoskeletal: Negative for back pain. Skin: Negative for rash. Neurological: Negative for headaches.  ____________________________________________   PHYSICAL EXAM:  VITAL SIGNS: ED Triage Vitals  Encounter Vitals Group     BP 01/29/24 1559 (!) 151/67     Pulse Rate 01/29/24 1559 77  Resp 01/29/24 1559 18     Temp 01/29/24 1559 97.8 F (36.6 C)     Temp Source 01/29/24 1559 Oral     SpO2 01/29/24 1559 100 %     Weight 01/29/24 1557 176 lb (79.8 kg)   Constitutional: Alert and oriented. Well appearing and in no acute distress. Eyes: Conjunctivae are normal. Head: Atraumatic. Nose: No congestion/rhinnorhea. Mouth/Throat: Mucous membranes are moist. Neck: No stridor.  Cardiovascular: Normal rate, regular rhythm. Good peripheral circulation. Grossly normal heart sounds.   Respiratory: Normal respiratory effort.  No retractions. Lungs CTAB. Gastrointestinal: Soft and nontender. No distention.  Musculoskeletal: No gross deformities of extremities. Neurologic:   Normal speech and language.  Skin:  Skin is warm, dry and intact. No rash noted.   ____________________________________________   LABS (all labs ordered are listed, but only abnormal results are displayed)  Labs Reviewed  URINALYSIS, ROUTINE W REFLEX MICROSCOPIC    ____________________________________________  RADIOLOGY  CT Renal Stone Study Result Date: 01/30/2024 EXAM: CT ABDOMEN AND PELVIS WITHOUT CONTRAST 01/30/2024 12:46:44 AM TECHNIQUE: CT of the abdomen and pelvis was performed without the administration of intravenous contrast. Multiplanar reformatted images are provided for review. Automated exposure control, iterative reconstruction, and/or weight-based adjustment of the mA/kV was utilized to reduce the radiation dose to as low as reasonably achievable. COMPARISON: Comparison with 01/09/2021. CLINICAL HISTORY: Abdominal/flank pain, stone suspected. Hematuria. FINDINGS: LOWER CHEST: Lung bases demonstrate emphysematous changes and scarring versus infiltration. Similar appearance to prior study. LIVER: The liver is normal. GALLBLADDER AND BILE DUCTS: The gallbladder is surgically absent. No biliary ductal dilatation. SPLEEN: The spleen is enlarged. PANCREAS: The pancreas is normal. ADRENAL GLANDS: No adrenal gland nodules. KIDNEYS, URETERS AND BLADDER: Bilateral renal atrophy with multiple subcentimeter cysts and multiple bilateral intrarenal stones. Stones measure up to 8 mm in diameter. Appearances are similar to the previous study. No hydronephrosis or hydroureter. The bladder is decompressed. No perinephric or periureteral stranding. Per consensus, no follow-up is needed for simple Bosniak type 1 and 2 renal cysts, unless the patient has a malignancy history or risk factors. GI AND BOWEL: The stomach, small bowel, and colon are not abnormally distended. No wall thickening or inflammatory stranding is noted. The appendix is normal. There is no bowel obstruction. PERITONEUM AND  RETROPERITONEUM: No free air or free fluid is identified in the abdomen. VASCULATURE: Diffuse calcification of the aorta is present. No aneurysm is seen. Aorta is normal in caliber. LYMPH NODES: No lymphadenopathy. REPRODUCTIVE ORGANS: The prostate gland is enlarged. BONES AND SOFT TISSUES: Degenerative changes in the spine. Left posterior perianal collection adjacent to the gluteal crease measuring 1.8 x 2.1 cm diameter. This is new since the prior study and may represent perianal abscess. Abdominal wall musculature appears intact. No acute osseous abnormality. IMPRESSION: 1. Bilateral renal atrophy with multiple subcentimeter cysts and multiple bilateral intrarenal stones, measuring up to 8 mm in diameter, similar to the previous study, without hydronephrosis or hydroureter. 2. Left posterior perianal collection adjacent to the gluteal crease measuring 1.8 x 2.1 cm, new since the prior study, may represent a perianal abscess. 3. Enlarged prostate gland. Electronically signed by: Elsie Gravely MD 01/30/2024 12:57 AM EST RP Workstation: [id number]    ____________________________________________   PROCEDURES  Procedure(s) performed:   Procedures  None  ____________________________________________   INITIAL IMPRESSION / ASSESSMENT AND PLAN / ED COURSE  Pertinent labs & imaging results that were available during my care of the patient were reviewed by me and considered in my medical decision  making (see chart for details).   This patient is Presenting for Evaluation of hematuria, which does require a range of treatment options, and is a complaint that involves a moderate risk of morbidity and mortality.  The Differential Diagnoses include UTI, ureteral stone, urethritis, etc.  Clinical Laboratory Tests Ordered, included patient unable to reliably provide urine with ESRD.   Radiologic Tests Ordered, included CT renal. I independently interpreted the images and agree with radiology  interpretation.   Medical Decision Making: Summary:  Patient reports intermittent hematuria over the past several months.  He has difficulty providing urine on a reliable basis due to his ESRD.  No abdominal or flank pain but given history plan for CT renal.  He had near complete session of dialysis today (3 hours).  Will not obtain screening blood work in that setting.   Reevaluation with update and discussion with patient.  He is still unable to provide a urine sample.  He states this is not unusual for him due to his ESRD.  Discussed his CT findings.  Plan for urology follow-up.  Patient plans to call the office tomorrow to schedule an appointment.  I asked about any perianal discomfort and he denies this.  No fevers.  No visible abscess on exam.  Patient's presentation is most consistent with acute presentation with potential threat to life or bodily function.   Disposition: discharge  ____________________________________________  FINAL CLINICAL IMPRESSION(S) / ED DIAGNOSES  Final diagnoses:  Gross hematuria    Note:  This document was prepared using Dragon voice recognition software and may include unintentional dictation errors.  Fonda Law, MD, [facility]    Ifeoluwa Bartz, Fonda MATSU, MD 01/30/24 [phone]  "

## 2024-01-30 NOTE — ED Notes (Signed)
 Patient transported to CT

## 2024-01-30 NOTE — Progress Notes (Signed)
 Pre Surgical Assessment: 5 M Walk Test  65M=16.22ft  5 Meter Walk Test- trial 1: 5.46 seconds 5 Meter Walk Test- trial 2: 5.11 seconds 5 Meter Walk Test- trial 3: 5.83 seconds 5 Meter Walk Test Average: 5.47 seconds

## 2024-01-30 NOTE — Patient Instructions (Addendum)
 Medication Instructions:  No medication changes were made at this visit. Continue current regimen.   *If you need a refill on your cardiac medications before your next appointment, please call your pharmacy*  Lab Work: To be completed today: blood culture   If you have labs (blood work) drawn today and your tests are completely normal, you will receive your results only by: MyChart Message (if you have MyChart) OR A paper copy in the mail If you have any lab test that is abnormal or we need to change your treatment, we will call you to review the results.  Testing/Procedures: None ordered today.  Follow-Up: At [facility], you and your health needs are our priority.  As part of our continuing mission to provide you with exceptional heart care, our providers are all part of one team.  This team includes your primary Cardiologist (physician) and Advanced Practice Providers or APPs (Physician Assistants and Nurse Practitioners) who all work together to provide you with the care you need, when you need it.  Your next appointment:   Per structural heart team

## 2024-01-30 NOTE — Discharge Instructions (Signed)
 Call Urology for close follow up. If you develop pain in your buttock please return.

## 2024-02-02 ENCOUNTER — Ambulatory Visit: Payer: Self-pay | Admitting: Internal Medicine

## 2024-02-05 LAB — CULTURE, BLOOD (SINGLE)

## 2024-02-10 ENCOUNTER — Emergency Department (HOSPITAL_COMMUNITY)

## 2024-02-10 ENCOUNTER — Other Ambulatory Visit: Payer: Self-pay

## 2024-02-10 ENCOUNTER — Encounter (HOSPITAL_COMMUNITY): Payer: Self-pay | Admitting: Internal Medicine

## 2024-02-10 ENCOUNTER — Inpatient Hospital Stay (HOSPITAL_COMMUNITY)
Admission: EM | Admit: 2024-02-10 | Discharge: 2024-02-15 | DRG: 871 | Disposition: A | Discharge status: Home / Self Care | Source: Ambulatory Visit | Attending: Internal Medicine | Admitting: Internal Medicine

## 2024-02-10 DIAGNOSIS — G4730 Sleep apnea, unspecified: Secondary | ICD-10-CM | POA: Diagnosis present

## 2024-02-10 DIAGNOSIS — F339 Major depressive disorder, recurrent, unspecified: Secondary | ICD-10-CM | POA: Diagnosis present

## 2024-02-10 DIAGNOSIS — E785 Hyperlipidemia, unspecified: Secondary | ICD-10-CM | POA: Diagnosis present

## 2024-02-10 DIAGNOSIS — Z9049 Acquired absence of other specified parts of digestive tract: Secondary | ICD-10-CM

## 2024-02-10 DIAGNOSIS — I5043 Acute on chronic combined systolic (congestive) and diastolic (congestive) heart failure: Secondary | ICD-10-CM | POA: Diagnosis not present

## 2024-02-10 DIAGNOSIS — I509 Heart failure, unspecified: Secondary | ICD-10-CM

## 2024-02-10 DIAGNOSIS — Z79899 Other long term (current) drug therapy: Secondary | ICD-10-CM

## 2024-02-10 DIAGNOSIS — D631 Anemia in chronic kidney disease: Secondary | ICD-10-CM | POA: Diagnosis present

## 2024-02-10 DIAGNOSIS — I350 Nonrheumatic aortic (valve) stenosis: Secondary | ICD-10-CM | POA: Diagnosis present

## 2024-02-10 DIAGNOSIS — G4733 Obstructive sleep apnea (adult) (pediatric): Secondary | ICD-10-CM | POA: Diagnosis not present

## 2024-02-10 DIAGNOSIS — Z992 Dependence on renal dialysis: Secondary | ICD-10-CM

## 2024-02-10 DIAGNOSIS — R569 Unspecified convulsions: Secondary | ICD-10-CM

## 2024-02-10 DIAGNOSIS — I10 Essential (primary) hypertension: Secondary | ICD-10-CM | POA: Diagnosis present

## 2024-02-10 DIAGNOSIS — I132 Hypertensive heart and chronic kidney disease with heart failure and with stage 5 chronic kidney disease, or end stage renal disease: Secondary | ICD-10-CM | POA: Diagnosis present

## 2024-02-10 DIAGNOSIS — G4710 Hypersomnia, unspecified: Secondary | ICD-10-CM | POA: Diagnosis not present

## 2024-02-10 DIAGNOSIS — I2510 Atherosclerotic heart disease of native coronary artery without angina pectoris: Secondary | ICD-10-CM | POA: Diagnosis present

## 2024-02-10 DIAGNOSIS — E662 Morbid (severe) obesity with alveolar hypoventilation: Secondary | ICD-10-CM | POA: Diagnosis present

## 2024-02-10 DIAGNOSIS — E1122 Type 2 diabetes mellitus with diabetic chronic kidney disease: Secondary | ICD-10-CM | POA: Diagnosis present

## 2024-02-10 DIAGNOSIS — J9621 Acute and chronic respiratory failure with hypoxia: Secondary | ICD-10-CM | POA: Diagnosis present

## 2024-02-10 DIAGNOSIS — A419 Sepsis, unspecified organism: Secondary | ICD-10-CM | POA: Diagnosis present

## 2024-02-10 DIAGNOSIS — E782 Mixed hyperlipidemia: Secondary | ICD-10-CM

## 2024-02-10 DIAGNOSIS — I5023 Acute on chronic systolic (congestive) heart failure: Secondary | ICD-10-CM | POA: Diagnosis present

## 2024-02-10 DIAGNOSIS — E871 Hypo-osmolality and hyponatremia: Secondary | ICD-10-CM | POA: Diagnosis present

## 2024-02-10 DIAGNOSIS — K219 Gastro-esophageal reflux disease without esophagitis: Secondary | ICD-10-CM | POA: Diagnosis present

## 2024-02-10 DIAGNOSIS — I480 Paroxysmal atrial fibrillation: Secondary | ICD-10-CM | POA: Diagnosis present

## 2024-02-10 DIAGNOSIS — N2581 Secondary hyperparathyroidism of renal origin: Secondary | ICD-10-CM | POA: Diagnosis present

## 2024-02-10 DIAGNOSIS — E1121 Type 2 diabetes mellitus with diabetic nephropathy: Secondary | ICD-10-CM | POA: Diagnosis present

## 2024-02-10 DIAGNOSIS — E1165 Type 2 diabetes mellitus with hyperglycemia: Secondary | ICD-10-CM | POA: Diagnosis present

## 2024-02-10 DIAGNOSIS — J449 Chronic obstructive pulmonary disease, unspecified: Secondary | ICD-10-CM | POA: Diagnosis present

## 2024-02-10 DIAGNOSIS — J189 Pneumonia, unspecified organism: Secondary | ICD-10-CM | POA: Diagnosis not present

## 2024-02-10 DIAGNOSIS — J4530 Mild persistent asthma, uncomplicated: Secondary | ICD-10-CM | POA: Diagnosis not present

## 2024-02-10 DIAGNOSIS — D638 Anemia in other chronic diseases classified elsewhere: Secondary | ICD-10-CM | POA: Diagnosis present

## 2024-02-10 DIAGNOSIS — D61818 Other pancytopenia: Secondary | ICD-10-CM | POA: Diagnosis present

## 2024-02-10 DIAGNOSIS — Z1152 Encounter for screening for COVID-19: Secondary | ICD-10-CM | POA: Diagnosis not present

## 2024-02-10 DIAGNOSIS — N186 End stage renal disease: Secondary | ICD-10-CM | POA: Diagnosis present

## 2024-02-10 DIAGNOSIS — J441 Chronic obstructive pulmonary disease with (acute) exacerbation: Secondary | ICD-10-CM | POA: Diagnosis present

## 2024-02-10 DIAGNOSIS — Q8781 Alport syndrome: Secondary | ICD-10-CM

## 2024-02-10 DIAGNOSIS — Z7982 Long term (current) use of aspirin: Secondary | ICD-10-CM | POA: Diagnosis not present

## 2024-02-10 DIAGNOSIS — B9781 Human metapneumovirus as the cause of diseases classified elsewhere: Secondary | ICD-10-CM | POA: Diagnosis present

## 2024-02-10 DIAGNOSIS — T8612 Kidney transplant failure: Secondary | ICD-10-CM | POA: Diagnosis present

## 2024-02-10 DIAGNOSIS — Z87891 Personal history of nicotine dependence: Secondary | ICD-10-CM

## 2024-02-10 DIAGNOSIS — N400 Enlarged prostate without lower urinary tract symptoms: Secondary | ICD-10-CM | POA: Diagnosis present

## 2024-02-10 DIAGNOSIS — F4310 Post-traumatic stress disorder, unspecified: Secondary | ICD-10-CM | POA: Diagnosis present

## 2024-02-10 DIAGNOSIS — F411 Generalized anxiety disorder: Secondary | ICD-10-CM | POA: Diagnosis present

## 2024-02-10 DIAGNOSIS — Y830 Surgical operation with transplant of whole organ as the cause of abnormal reaction of the patient, or of later complication, without mention of misadventure at the time of the procedure: Secondary | ICD-10-CM | POA: Diagnosis present

## 2024-02-10 DIAGNOSIS — Z905 Acquired absence of kidney: Secondary | ICD-10-CM

## 2024-02-10 DIAGNOSIS — C50919 Malignant neoplasm of unspecified site of unspecified female breast: Secondary | ICD-10-CM | POA: Diagnosis present

## 2024-02-10 DIAGNOSIS — S065XAA Traumatic subdural hemorrhage with loss of consciousness status unknown, initial encounter: Secondary | ICD-10-CM | POA: Diagnosis present

## 2024-02-10 DIAGNOSIS — E669 Obesity, unspecified: Secondary | ICD-10-CM | POA: Diagnosis present

## 2024-02-10 LAB — RESP PANEL BY RT-PCR (RSV, FLU A&B, COVID)  RVPGX2
Influenza A by PCR: NEGATIVE
Influenza B by PCR: NEGATIVE
Resp Syncytial Virus by PCR: NEGATIVE
SARS Coronavirus 2 by RT PCR: NEGATIVE

## 2024-02-10 LAB — CBC WITH DIFFERENTIAL/PLATELET
Abs Immature Granulocytes: 0.01 K/uL (ref 0.00–0.07)
Basophils Absolute: 0 K/uL (ref 0.0–0.1)
Basophils Relative: 0 %
Eosinophils Absolute: 0 K/uL (ref 0.0–0.5)
Eosinophils Relative: 0 %
HCT: 27.4 % — ABNORMAL LOW (ref 39.0–52.0)
Hemoglobin: 9 g/dL — ABNORMAL LOW (ref 13.0–17.0)
Immature Granulocytes: 0 %
Lymphocytes Relative: 7 %
Lymphs Abs: 0.2 K/uL — ABNORMAL LOW (ref 0.7–4.0)
MCH: 32.7 pg (ref 26.0–34.0)
MCHC: 32.8 g/dL (ref 30.0–36.0)
MCV: 99.6 fL (ref 80.0–100.0)
Monocytes Absolute: 0.3 K/uL (ref 0.1–1.0)
Monocytes Relative: 11 %
Neutro Abs: 2.1 K/uL (ref 1.7–7.7)
Neutrophils Relative %: 82 %
Platelets: 71 K/uL — ABNORMAL LOW (ref 150–400)
RBC: 2.75 MIL/uL — ABNORMAL LOW (ref 4.22–5.81)
RDW: 14.6 % (ref 11.5–15.5)
WBC: 2.6 K/uL — ABNORMAL LOW (ref 4.0–10.5)
nRBC: 0 % (ref 0.0–0.2)

## 2024-02-10 LAB — RESPIRATORY PANEL BY PCR

## 2024-02-10 LAB — COMPREHENSIVE METABOLIC PANEL WITH GFR
ALT: 10 U/L (ref 0–44)
AST: 18 U/L (ref 15–41)
Albumin: 4.1 g/dL (ref 3.5–5.0)
Alkaline Phosphatase: 73 U/L (ref 38–126)
Anion gap: 13 (ref 5–15)
BUN: 17 mg/dL (ref 8–23)
CO2: 32 mmol/L (ref 22–32)
Calcium: 8.7 mg/dL — ABNORMAL LOW (ref 8.9–10.3)
Chloride: 93 mmol/L — ABNORMAL LOW (ref 98–111)
Creatinine, Ser: 5.15 mg/dL — ABNORMAL HIGH (ref 0.61–1.24)
GFR, Estimated: 12 mL/min — ABNORMAL LOW
Glucose, Bld: 114 mg/dL — ABNORMAL HIGH (ref 70–99)
Potassium: 3.5 mmol/L (ref 3.5–5.1)
Sodium: 138 mmol/L (ref 135–145)
Total Bilirubin: 0.4 mg/dL (ref 0.0–1.2)
Total Protein: 7.2 g/dL (ref 6.5–8.1)

## 2024-02-10 LAB — CBG MONITORING, ED: Glucose-Capillary: 117 mg/dL — ABNORMAL HIGH (ref 70–99)

## 2024-02-10 LAB — PROTIME-INR
INR: 1.1 (ref 0.8–1.2)
Prothrombin Time: 14.7 s (ref 11.4–15.2)

## 2024-02-10 LAB — PRO BRAIN NATRIURETIC PEPTIDE: Pro Brain Natriuretic Peptide: >35000 pg/mL — ABNORMAL HIGH

## 2024-02-10 LAB — I-STAT CG4 LACTIC ACID, ED
Lactic Acid, Venous: 0.9 mmol/L (ref 0.5–1.9)
Lactic Acid, Venous: 0.5 mmol/L (ref 0.5–1.9)

## 2024-02-10 LAB — MAGNESIUM: Magnesium: 1.9 mg/dL (ref 1.7–2.4)

## 2024-02-10 LAB — PROCALCITONIN: Procalcitonin: 1.14 ng/mL

## 2024-02-10 MED ORDER — DOXAZOSIN MESYLATE 4 MG PO TABS
4.0000 mg | ORAL_TABLET | Freq: Every day | ORAL | Status: DC
  Administered 2024-02-11 – 2024-02-15 (×3): 4 mg via ORAL
  Filled 2024-02-10 (×5): qty 1
  Filled 2024-02-10: qty 2

## 2024-02-10 MED ORDER — VANCOMYCIN HCL IN DEXTROSE 1-5 GM/200ML-% IV SOLN: 1000.0000 mg | Freq: Once | INTRAVENOUS | Status: DC

## 2024-02-10 MED ORDER — ACETAMINOPHEN 500 MG PO TABS
1000.0000 mg | ORAL_TABLET | Freq: Once | ORAL | Status: AC
  Administered 2024-02-10: 1000 mg via ORAL
  Filled 2024-02-10: qty 2

## 2024-02-10 MED ORDER — SODIUM CHLORIDE 0.9 % IV SOLN
2.0000 g | Freq: Once | INTRAVENOUS | Status: AC
  Administered 2024-02-10: 2 g via INTRAVENOUS
  Filled 2024-02-10: qty 12.5

## 2024-02-10 MED ORDER — FUROSEMIDE 10 MG/ML IJ SOLN
40.0000 mg | Freq: Once | INTRAMUSCULAR | Status: AC
  Administered 2024-02-10: 40 mg via INTRAVENOUS
  Filled 2024-02-10: qty 4

## 2024-02-10 MED ORDER — VANCOMYCIN HCL 1500 MG/300ML IV SOLN
1500.0000 mg | Freq: Once | INTRAVENOUS | Status: AC
  Administered 2024-02-10: 1500 mg via INTRAVENOUS
  Filled 2024-02-10: qty 300

## 2024-02-10 MED ORDER — ASPIRIN 81 MG PO TBEC
81.0000 mg | DELAYED_RELEASE_TABLET | Freq: Every day | ORAL | Status: DC
  Administered 2024-02-11 – 2024-02-14 (×4): 81 mg via ORAL
  Filled 2024-02-10 (×4): qty 1

## 2024-02-10 MED ORDER — METRONIDAZOLE 500 MG/100ML IV SOLN
500.0000 mg | Freq: Once | INTRAVENOUS | Status: AC
  Administered 2024-02-10: 500 mg via INTRAVENOUS
  Filled 2024-02-10: qty 100

## 2024-02-10 MED ORDER — SODIUM CHLORIDE 0.9 % IV SOLN: 500.0000 mg | INTRAVENOUS | Status: DC

## 2024-02-10 MED ORDER — SODIUM CHLORIDE 0.9 % IV SOLN
2.0000 g | INTRAVENOUS | Status: DC
  Administered 2024-02-10 – 2024-02-11 (×2): 2 g via INTRAVENOUS
  Filled 2024-02-10 (×2): qty 20

## 2024-02-10 MED ORDER — IOHEXOL 350 MG/ML SOLN
75.0000 mL | Freq: Once | INTRAVENOUS | Status: AC | PRN
  Administered 2024-02-10: 75 mL via INTRAVENOUS

## 2024-02-10 MED ORDER — SODIUM CHLORIDE 0.9% FLUSH
3.0000 mL | Freq: Two times a day (BID) | INTRAVENOUS | Status: DC
  Administered 2024-02-11 – 2024-02-15 (×8): 3 mL via INTRAVENOUS

## 2024-02-10 MED ORDER — SODIUM CHLORIDE 0.9 % IV SOLN
100.0000 mg | Freq: Two times a day (BID) | INTRAVENOUS | Status: DC
  Administered 2024-02-10 – 2024-02-12 (×4): 100 mg via INTRAVENOUS
  Filled 2024-02-10 (×4): qty 100

## 2024-02-10 MED ORDER — ACETAMINOPHEN 650 MG RE SUPP: 650.0000 mg | Freq: Four times a day (QID) | RECTAL | Status: DC | PRN

## 2024-02-10 MED ORDER — HYDROMORPHONE HCL 1 MG/ML IJ SOLN
1.0000 mg | Freq: Once | INTRAMUSCULAR | Status: AC
  Administered 2024-02-10: 1 mg via INTRAVENOUS
  Filled 2024-02-10: qty 1

## 2024-02-10 MED ORDER — POLYETHYLENE GLYCOL 3350 17 G PO PACK: 17.0000 g | PACK | Freq: Every day | ORAL | Status: DC | PRN

## 2024-02-10 MED ORDER — ALBUTEROL SULFATE (2.5 MG/3ML) 0.083% IN NEBU
2.5000 mg | INHALATION_SOLUTION | RESPIRATORY_TRACT | Status: DC | PRN
  Administered 2024-02-12: 2.5 mg via RESPIRATORY_TRACT
  Filled 2024-02-10: qty 3

## 2024-02-10 MED ORDER — ACETAMINOPHEN 325 MG PO TABS
650.0000 mg | ORAL_TABLET | Freq: Four times a day (QID) | ORAL | Status: DC | PRN
  Administered 2024-02-11 – 2024-02-12 (×2): 650 mg via ORAL
  Filled 2024-02-10 (×2): qty 2

## 2024-02-10 MED ORDER — INSULIN ASPART 100 UNIT/ML IJ SOLN
0.0000 [IU] | Freq: Three times a day (TID) | INTRAMUSCULAR | Status: DC
  Administered 2024-02-12 – 2024-02-14 (×3): 1 [IU] via SUBCUTANEOUS
  Filled 2024-02-10 (×3): qty 1

## 2024-02-10 MED ORDER — HYDRALAZINE HCL 25 MG PO TABS
100.0000 mg | ORAL_TABLET | Freq: Two times a day (BID) | ORAL | Status: DC
  Administered 2024-02-11 – 2024-02-15 (×7): 100 mg via ORAL
  Filled 2024-02-10 (×7): qty 4

## 2024-02-10 MED ORDER — HEPARIN SODIUM (PORCINE) 5000 UNIT/ML IJ SOLN
5000.0000 [IU] | Freq: Three times a day (TID) | INTRAMUSCULAR | Status: DC
  Administered 2024-02-10 – 2024-02-15 (×12): 5000 [IU] via SUBCUTANEOUS
  Filled 2024-02-10 (×11): qty 1

## 2024-02-10 MED ORDER — RENA-VITE PO TABS
1.0000 | ORAL_TABLET | Freq: Every day | ORAL | Status: DC
  Administered 2024-02-11 – 2024-02-15 (×4): 1 via ORAL
  Filled 2024-02-10 (×5): qty 1

## 2024-02-10 NOTE — ED Notes (Signed)
 Pt ambulatory to bathroom with NT at side.

## 2024-02-10 NOTE — ED Triage Notes (Signed)
 Patient from dialysis (only partial treatment today, full treatment on Friday) for eval of SOB and cough x 1 week. Others at home sick. 89% SpO2 room air. Febrile 102.4.

## 2024-02-10 NOTE — H&P (Signed)
 " History and Physical   YASH CACCIOLA FMW:[id number] DOB: 11-10-1960 DOA: 02/10/2024  PCP: Leonarda Roxan [name], NP   Patient coming from: Dialysis  Chief Complaint: Shortness of breath  HPI: Kirk [name] is a 64 y.o. male with medical history significant of hypertension, hyperlipidemia, diabetes, paroxysmal atrial fibrillation, CAD, chronic combined systolic and diastolic CHF, chronic respiratory failure with hypoxia, PTSD, depression, ESRD on HD, COPD/asthma, OSA, OHS, obesity, seizures, subdural hematoma, pancytopenia presenting with shortness of breath, cough.  Patient reports 1 to 2 weeks of cough with shortness of breath.  Cough is significantly worse today.  Productive of yellow sputum.  Patient does have sick contacts in his family at home.  Presents to dialysis today was short of breath.  Noted to be saturating around 89% on room air and had a fever.  Only completed a partial session.  Patient states that he does have oxygen  that he uses at home intermittently at 3 to 4 L.  Patient denies chills, chest pain, abdominal pain, constipation, diarrhea, nausea, vomiting.  ED Course: Vital signs in the ED notable for fever to 1-2.4, blood pressure in the 120s-150 systolic, respiratory rate in the 20s-30s, requiring 3 L to maintain saturations.  Lab workup included CMP with chloride 93, creatinine 5.15, glucose 114, calcium  8.7.  CBC with leukopenia 2.6, anemia at 9.0, thrombocytopenia at 71.  PT, PTT, INR normal.  proBNP elevated to greater than 35,000.  Lactic acid normal.  Respiratory panel for flu COVID and RSV negative.  Blood cultures pending.  Chest x-ray showed cardiomegaly with vascular congestion and patchy opacities representing edema versus infection.  CT of the chest abdomen pelvis showed motion degraded exam with chronic nodularity as well as new consolidation on top of this chronic nodularity suspicious for infection/pneumonia.  Also noted was likely reactive lymphadenopathy.   No acute abnormality of the abdomen pelvis but did demonstrate stable 2.6 mm fluid density in the soft tissues of the gluteal cleft.  Finding consistent with failed renal transplant also noted.  Patient received vancomycin , cefepime , Flagyl  in the ED as well as Tylenol  and Lasix  (though does not currently make urine).  Nephrology consulted for HD, EDP waiting to hear back.  Review of Systems: As per HPI otherwise all other systems reviewed and are negative.  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)    Anxiety    Asthma    Asthma    moderate persistent   Bacteremia 12/16/2023   CAP (community acquired pneumonia) 05/30/2017   Carpal tunnel syndrome    Cellulitis and abscess of trunk    Cholelithiasis 07/13/2014   Chronic headaches    Cigarette smoker 07/11/2017   Stopped 2/022  - referred to start Lung cancer screening 04/2021         Debility, unspecified    Dermatophytosis of the body    Dysrhythmia    history of   Edema    End stage renal disease on dialysis (HCC)    MWF; [facility] in [place] (10/21/2014)   Essential hypertension, benign    Generalized anxiety disorder 05/25/2022   GERD (gastroesophageal reflux disease)    Gout, unspecified    History of renal transplant    HTN (hypertension)    Hypertrophy of prostate without urinary obstruction and other lower urinary tract symptoms (LUTS)  Hypotension, unspecified    Hypoxia 03/21/2023   Impotence of organic origin    Insomnia, unspecified    Localization-related (focal) (partial) epilepsy and epileptic syndromes with complex partial seizures, without mention of intractable epilepsy    12-15-19- Wife states he has NEVER had a seizure    Lumbago    Memory loss    OSA on CPAP    Other and unspecified hyperlipidemia    controlled /managed per wife    Other  chronic nonalcoholic liver disease    Other malaise and fatigue    Other nonspecific abnormal serum enzyme levels    Pain in joint, lower leg    Pain in joint, upper arm    Pneumonia several times   PONV (postoperative nausea and vomiting)    Rhinovirus infection 10/05/2023   Secondary hyperparathyroidism (of renal origin)    Shortness of breath    Sleep apnea    wears cpap    Tension headache    Unspecified constipation    Unspecified essential hypertension    Unspecified hereditary and idiopathic peripheral neuropathy    Unspecified vitamin D  deficiency     Past Surgical History:  Procedure Laterality Date   A/V FISTULAGRAM N/A 03/28/2023   Procedure: A/V Fistulagram;  Surgeon: Melia Lynwood ORN, MD;  Location: [facility] INVASIVE CV LAB;  Service: Cardiovascular;  Laterality: N/A;   A/V FISTULAGRAM Left 11/07/2023   Procedure: A/V Fistulagram;  Surgeon: Serene Gaile ORN, MD;  Location: [facility] PV LAB;  Service: Cardiovascular;  Laterality: Left;   AV FISTULA PLACEMENT Left ?2010   forearm; at [facility]   BACK SURGERY     CARDIAC CATHETERIZATION  03/21/2011   CHOLECYSTECTOMY N/A 10/21/2014   Procedure: LAPAROSCOPIC CHOLECYSTECTOMY WITH INTRAOPERATIVE CHOLANGIOGRAM;  Surgeon: Deward Null III, MD;  Location[facility] OR;  Service: General;  Laterality: N/A;   COLONOSCOPY     ESOPHAGOGASTRODUODENOSCOPY N/A 06/21/2023   Procedure: EGD (ESOPHAGOGASTRODUODENOSCOPY);  Surgeon: Legrand Victory LITTIE DOUGLAS, MD;  Location: [facility] ENDOSCOPY;  Service: Gastroenterology;  Laterality: N/A;   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   IR ANGIO INTRA EXTRACRAN SEL INTERNAL CAROTID UNI R MOD SED  11/30/2021   IR ANGIOGRAM FOLLOW UP STUDY  11/30/2021   IR AV DIALY SHUNT INTRO NEEDLE/INTRACATH INITIAL W/PTA/IMG LEFT  12/13/2022   IR BONE MARROW BIOPSY & ASPIRATION  03/27/2022   IR NEURO EACH ADD'L AFTER BASIC UNI RIGHT (MS)  11/30/2021   IR TRANSCATH/EMBOLIZ  11/30/2021   IR US  GUIDE VASC ACCESS LEFT  12/13/2022    KIDNEY TRANSPLANT  08/17/2011   [facility]    LAPAROSCOPIC CHOLECYSTECTOMY  10/21/2014   w/IOC   LEFT HEART CATH AND CORONARY ANGIOGRAPHY N/A 04/09/2023   Procedure: LEFT HEART CATH AND CORONARY ANGIOGRAPHY;  Surgeon: Anner Alm ORN, MD;  Location: [facility] INVAS[facility] CV LAB;  Service: Cardiovascular;  Laterality: N/A;   LEFT HEART CATH AND CORONARY ANGIOGRAPHY N/A 10/04/2023   Procedure: LEFT HEART CATH AND CORONARY ANGIOGRAPHY;  Surgeon: Anner Alm ORN, MD;  Location: [facility] INVASIVE CV LAB;  Service: Cardiovascular;  Laterality: N/A;   LEFT HEART CATHETERIZATION WITH CORONARY ANGIOGRAM N/A 03/21/2011   Procedure: LEFT HEART CATHETERIZATION WITH CORONARY ANGIOGRAM;  Surgeon: Vinie KYM Maxcy, MD;  Location: [facility] CATH LAB;  Service: Cardiovascular;  Laterality: N/A;   NEPHRECTOMY  08/2013   removed transplaned kidney   PERIPHERAL VASCULAR BALLOON ANGIOPLASTY Left 03/28/2023   Procedure: PERIPHERAL VASCULAR BALLOON ANGIOPLASTY;  Surgeon: Melia Lynwood ORN, MD;  Location: [facility]  INVASIVE CV LAB;  Service: Cardiovascular;  Laterality: Left;  80% outflow cephalic   POLYPECTOMY     POSTERIOR FUSION CERVICAL SPINE  06/25/2012   for spinal stenosis   RADIOLOGY WITH ANESTHESIA N/A 11/30/2021   Procedure: RIGHT MIDDLE MENINGEAL ARTERY EMBOLIZATION;  Surgeon: Radiologist, Medication, MD;  Location: [facility] OR;  Service: Radiology;  Laterality: N/A;   RIGHT/LEFT HEART CATH AND CORONARY ANGIOGRAPHY N/A 12/16/2023   Procedure: RIGHT/LEFT HEART CATH AND CORONARY ANGIOGRAPHY;  Surgeon: Anner Alm ORN, MD;  Location: [facility] INVASIVE CV LAB;  Service: Cardiovascular;  Laterality: N/A;   TRANSESOPHAGEAL ECHOCARDIOGRAM (CATH LAB) N/A 12/16/2023   Procedure: TRANSESOPHAGEAL ECHOCARDIOGRAM;  Surgeon: Raford Riggs, MD;  Location: [facility] INVASIVE CV LAB;  Service: Cardiovascular;  Laterality: N/A;   VASECTOMY  2010   VENOUS ANGIOPLASTY  11/07/2023   Procedure: VENOUS ANGIOPLASTY;  Surgeon: Serene Gaile ORN, MD;  Location: [facility] PV LAB;   Service: Cardiovascular;;  cephalic vein    Social History  reports that he quit smoking about 3 years ago. His smoking use included cigarettes. He started smoking about 33 years ago. He has a 16 pack-year smoking history. He has never used smokeless tobacco. He reports that he does not drink alcohol and does not use drugs.  Allergies[1]  Family History  Adopted: Yes  Problem Relation Age of Onset   Colon cancer Neg Hx    Esophageal cancer Neg Hx    Rectal cancer Neg Hx    Stomach cancer Neg Hx    Colon polyps Neg Hx   Reviewed on admission  Prior to Admission medications  Medication Sig Start Date End Date Taking? Authorizing Provider  acetaminophen  (TYLENOL ) 500 MG tablet Take 1,000 mg by mouth 2 (two) times daily.   Yes [provider]  albuterol  (PROVENTIL ) (2.5 MG/3ML) 0.083% nebulizer solution Take 3 mLs (2.5 mg total) by nebulization every 4 (four) hours as needed for wheezing or shortness of breath. 06/25/23  Yes Elgergawy, Brayton RAMAN, MD  aspirin  EC 81 MG tablet Take 81 mg by mouth daily at 6 PM.   Yes [provider]  diphenhydrAMINE  (BENADRYL ) 25 MG tablet Take 50-75 mg by mouth 2 (two) times daily.   Yes [provider]  DM-Doxylamine-Acetaminophen  (NYQUIL HBP COLD & FLU PO) Take 30 mLs by mouth every 12 (twelve) hours as needed (cough).   Yes [provider]  doxazosin  (CARDURA ) 4 MG tablet Take 4 mg by mouth daily.   Yes [provider]  hydrALAZINE  (APRESOLINE ) 100 MG tablet Take 1 tablet (100 mg total) by mouth 3 (three) times daily. Patient taking differently: Take 100 mg by mouth 2 (two) times daily. 12/18/23 02/10/24 Yes Pahwani, Ravi, MD  multivitamin (RENA-VIT) TABS tablet Take 1 tablet by mouth daily. 01/29/23  Yes Ngetich, Dinah C, NP  oxyCODONE  (ROXICODONE ) 5 MG immediate release tablet Take 1 tablet (5 mg total) by mouth every 4 (four) hours as needed for severe pain (pain score 7-10). 11/15/23  Yes Freddi Hamilton, MD   sevelamer  carbonate (RENVELA ) 2.4 g PACK Take 4.8 g by mouth 3 (three) times daily with meals. Also takes with his snacks   Yes [provider]  doxazosin  (CARDURA ) 8 MG tablet Take 1 tablet (8 mg total) by mouth at bedtime. Patient not taking: Reported on 01/30/2024 12/18/23   Vernon Ranks, MD  metoprolol  succinate (TOPROL -XL) 25 MG 24 hr tablet Take 1.5 tablets (37.5 mg total) by mouth daily. Patient not taking: Reported on 02/10/2024 12/18/23 02/10/24  Vernon Ranks,  MD  OXYGEN  Inhale 3 L/min into the lungs continuous.    [provider]  UNABLE TO FIND Inhale 1 Device into the lungs at bedtime. CPAP    [provider]    Physical Exam: Vitals:   02/10/24 1646 02/10/24 1715 02/10/24 1730 02/10/24 1837  BP:  128/76  (!) 147/62  Pulse:  82 84 82  Resp:  (!) 30 (!) 28 (!) 21  Temp: (!) 101.8 F (38.8 C)   99.2 F (37.3 C)  TempSrc: Oral   Oral  SpO2:  98% 100% 100%    Physical Exam Constitutional:      General: He is not in acute distress.    Appearance: Normal appearance. He is not ill-appearing.  HENT:     Head: Normocephalic and atraumatic.     Mouth/Throat:     Mouth: Mucous membranes are moist.     Pharynx: Oropharynx is clear.  Eyes:     Extraocular Movements: Extraocular movements intact.     Pupils: Pupils are equal, round, and reactive to light.  Cardiovascular:     Rate and Rhythm: Normal rate and regular rhythm.     Pulses: Normal pulses.     Heart sounds: Normal heart sounds.  Pulmonary:     Effort: Pulmonary effort is normal. No respiratory distress.     Breath sounds: Rhonchi and rales present.  Abdominal:     General: Bowel sounds are normal. There is no distension.     Palpations: Abdomen is soft.     Tenderness: There is no abdominal tenderness.  Musculoskeletal:        General: No swelling or deformity.     Right lower leg: No edema.     Left lower leg: No edema.  Skin:    General: Skin is warm and dry.  Neurological:      General: No focal deficit present.     Mental Status: Mental status is at baseline.    Labs on Admission: I have personally reviewed following labs and imaging studies  CBC: Recent Labs  Lab 02/10/24 1523  WBC 2.6*  NEUTROABS 2.1  HGB 9.0*  HCT 27.4*  MCV 99.6  PLT 71*    Basic Metabolic Panel: Recent Labs  Lab 02/10/24 1523  NA 138  K 3.5  CL 93*  CO2 32  GLUCOSE 114*  BUN 17  CREATININE 5.15*  CALCIUM  8.7*    GFR: Estimated Creatinine Clearance: 15.1 mL/min (A) (by C-G formula based on SCr of 5.15 mg/dL (H)).  Liver Function Tests: Recent Labs  Lab 02/10/24 1523  AST 18  ALT 10  ALKPHOS 73  BILITOT 0.4  PROT 7.2  ALBUMIN  4.1    Urine analysis:    Component Value Date/Time   COLORURINE YELLOW 02/19/2017 1520   APPEARANCEUR HAZY (A) 02/19/2017 1520   LABSPEC 1.012 02/19/2017 1520   PHURINE 8.0 02/19/2017 1520   GLUCOSEU 50 (A) 02/19/2017 1520   HGBUR MODERATE (A) 02/19/2017 1520   BILIRUBINUR NEGATIVE 02/19/2017 1520   KETONESUR NEGATIVE 02/19/2017 1520   PROTEINUR >=300 (A) 02/19/2017 1520   UROBILINOGEN 0.2 04/06/2014 1030   NITRITE NEGATIVE 02/19/2017 1520   LEUKOCYTESUR MODERATE (A) 02/19/2017 1520    Radiological Exams on Admission: CT CHEST ABDOMEN PELVIS W CONTRAST Result Date: 02/10/2024 CLINICAL DATA:  Shortness of breath EXAM: CT CHEST, ABDOMEN, AND PELVIS WITH CONTRAST TECHNIQUE: Multidetector CT imaging of the chest, abdomen and pelvis was performed following the standard protocol during bolus administration of intravenous contrast.  RADIATION DOSE REDUCTION: This exam was performed according to the departmental dose-optimization program which includes automated exposure control, adjustment of the mA and/or kV according to patient size and/or use of iterative reconstruction technique. CONTRAST:  75mL OMNIPAQUE  IOHEXOL  350 MG/ML SOLN COMPARISON:  Chest x-ray 02/10/2024, chest CT 11/19/2023, 05/06/2020 FINDINGS: CT CHEST FINDINGS  Cardiovascular: Moderate aortic atherosclerosis. No aneurysm. Multi vessel coronary vascular calcification. Cardiomegaly. Aortic valvular calcification. Mitral calcification. No pericardial effusion. Mediastinum/Nodes: Patent trachea. No suspicious thyroid  mass. Mildly enlarged mediastinal lymph nodes, precarinal node measures 14 mm. Subcarinal lymph node measures 15 mm. Esophagus within normal limits. Lungs/Pleura: Limited assessment of the parenchyma due to motion degradation. Mild subpleural reticulation and peribronchovascular clustered nodularity some of which is chronic and was seen on exam from 2022. However, small focus of airspace consolidation within the right middle lobe and overall increased peribronchovascular nodularity and airspace disease, greatest within the right middle lobe and bilateral lower lobes suspicious for acute respiratory infection/pneumonia. No pleural effusion or pneumothorax Musculoskeletal: No acute or suspicious osseous abnormality. CT ABDOMEN PELVIS FINDINGS Hepatobiliary: No focal liver abnormality is seen. Status post cholecystectomy. No biliary dilatation. Pancreas: Unremarkable. No pancreatic ductal dilatation or surrounding inflammatory changes. Spleen: Upper normal in size to slightly enlarged at 13 cm Adrenals/Urinary Tract: Inter adrenal glands are normal. Atrophic native kidneys with numerous stones as well as cysts and subcentimeter hypodensities too small to further characterize, no specific imaging follow-up is recommended. Calcific mass anterior to the right psoas muscle suspected to represent failed renal transplant. Bladder is decompressed Stomach/Bowel: Stomach within normal limits. No dilated small bowel. No acute bowel wall thickening. Diverticular disease of the left colon Vascular/Lymphatic: Aortic atherosclerosis. No enlarged abdominal or pelvic lymph nodes. Reproductive: Enlarged prostate Other: No ascites or free air. Redemonstrated 2.6 cm cystic collection  within the subcutaneous soft tissues of the medial left gluteal fold without substantial surrounding inflammation. Musculoskeletal: No acute or suspicious osseous abnormality IMPRESSION: 1. Limited assessment of the lung parenchyma due to motion degradation. Mild subpleural reticulation and peribronchovascular clustered nodularity some of which is chronic and was seen on exam from 2022. There is however, small focus of airspace consolidation within the right middle lobe and overall increased peribronchovascular nodularity and airspace disease, greatest within the right middle lobe and bilateral lower lobes suspicious for acute respiratory infection/pneumonia superimposed on underlying chronic change. 2. Mildly enlarged mediastinal lymph nodes, likely reactive. 3. No CT evidence for acute intra-abdominal or pelvic abnormality. 4. Atrophic native kidneys with numerous stones. Calcific mass anterior to the right psoas muscle suspected to represent failed renal transplant. 5. Stable 2.6 cm fluid density lesion within the subcutaneous soft tissues of the medial left gluteal cleft, without significant surrounding soft tissue inflammatory process, possibly a subcutaneous cysts, correlate with direct inspection. 6. Diverticular disease of the left colon without acute inflammatory process. 7. Aortic atherosclerosis. Aortic Atherosclerosis (ICD10-I70.0). Electronically Signed   By: Luke Bun M.D.   On: 02/10/2024 17:58   DG Chest 2 View if patient is not in a treatment room. Result Date: 02/10/2024 CLINICAL DATA:  Suspected sepsis shortness of breath and cough EXAM: DG CHEST 2V COMPARISON:  12/09/2023 FINDINGS: Hardware in the cervical spine. Cardiomegaly with central congestion and mild patchy mid to lower lung opacity. No pleural effusion or pneumothorax IMPRESSION: Cardiomegaly with central congestion. Mild patchy mid to lower lung opacity may be due to edema or infection. Electronically Signed   By: Luke Bun  M.D.   On: 02/10/2024 16:09   EKG:  Ordered in the ED, but not yet performed/resulted.  Assessment/Plan Principal Problem:   Sepsis due to pneumonia [facility]) Active Problems:   OSA on CPAP   ESRD on dialysis [facility])   Essential hypertension   Alports syndrome   Obesity   Type 2 diabetes mellitus with diabetic nephropathy, without long-term current use of insulin  (HCC)   Pancytopenia (HCC)   Anemia of chronic disease   HLD (hyperlipidemia)   Mild persistent chronic asthma without complication   Obesity hypoventilation syndrome (HCC)   Hypersomnia with sleep apnea   MDD (major depressive disorder), recurrent episode   SDH (subdural hematoma) (HCC)   Seizure (HCC)   PTSD (post-traumatic stress disorder)   Acute on chronic combined systolic and diastolic CHF (congestive heart failure) (HCC)   Stage IV carcinoma of breast (HCC)   COPD (chronic obstructive pulmonary disease) (HCC)   PAF (paroxysmal atrial fibrillation) (HCC)   CAD (coronary artery disease)   Sepsis secondary to pneumonia Chronic respiratory failure with hypoxia > Patient presenting with 2 weeks of worsening cough and fever.  Meets sepsis criteria with pulmonary source, fever, tachypnea, leukopenia. > Chest x-ray and CT chest consistent with superimposed pneumonia on chronic changes. > Viral panel for flu COVID and RSV negative. > Does have sick contacts at home  though with duration of symptoms likely represents a superimposed bacterial pneumonia on prior viral infection. > Leukopenia however this is in the setting of chronic pancytopenia.  Will check for viral panel and procalcitonin to further delineate between bacterial and viral pulmonary infection. > Requiring 3 L of oxygen  in the ED which is not unusual for patient with chronic respiratory failure, states he uses oxygen  at home intermittently (3 to 4 L). > Some component of volume overload with severely elevated proBNP as below leading to respiratory symptoms. >  Received broad-spectrum antibiotics in the ED and Tylenol . - Monitor on progressive unit - Continue antibiotics: Narrowed to ceftriaxone  and azithromycin  - Trend fever curve and WBC - Check procalcitonin - Check full RVP - Lactic acid normal and BP stable; hold off on fluids given patient also has some volume overload as below  Volume overload? ESRD on HD (History of Alports syndrome and failed renal transplant) Acute on chronic combined systolic and diastolic CHF > Last echo was in November 2025 with EF 25-30%, global hypokinesis, indeterminate diastolic function, normal RV function. > Patient completed partial session of dialysis today but ended early due to respiratory symptoms as above. > Does not have significant edema, but rales are present > Imaging with possible pulmonary edema and proBNP elevated to greater than 35,000.  This appears to be under proportion to patient's exam, due to this bedside POCUS performed with cardiac exam in parasternal long axis and four-chamber view performed.  EF appears to be roughly stable in the 25 to 30% range.  Valves and other details not examined. > Volume managed with dialysis. - Nephrology consulted in the ED, they are waiting to hear back - Plan for continued volume management with HD - Check magnesium  - Trend renal function and electrolytes - Strict I's and O's, daily weights - Hold off on repeat echo as this was recently done  Hypertension - Hold hydralazine  and metoprolol  in the setting of developing sepsis.  Will continue with dialysis for volume management and monitor blood pressure response  Hyperlipidemia CAD > Not currently on antihyperlipidemic.  Nonobstructive CAD by cath in 2025. - Holding metoprolol  as above - Continue home ASA  Diabetes - SSI  Pancytopenia > Has been worked up by oncology with unrevealing workup.  Currently platelets 71, WBC 2.6, hemoglobin 9.0. - Continue to trend CBC  COPD Asthma - Continue as needed  albuterol   OSA OHS - Continue home CPAP  Obesity - Noted  History of seizure disorder - History of traumatic SDH - Noted  DVT prophylaxis: Heparin  Code Status:   Full Family Communication:  None on admission  Disposition Plan:   Patient is from:  Home  Anticipated DC to:  Home  Anticipated DC date:  2 to 5 days  Anticipated DC barriers: None  Consults called:  Nephrology consulted in the ED Admission status:  Inpatient, progressive  Severity of Illness: The appropriate patient status for this patient is INPATIENT. Inpatient status is judged to be reasonable and necessary in order to provide the required intensity of service to ensure the patient's safety. The patient's presenting symptoms, physical exam findings, and initial radiographic and laboratory data in the context of their chronic comorbidities is felt to place them at high risk for further clinical deterioration. Furthermore, it is not anticipated that the patient will be medically stable for discharge from the hospital within 2 midnights of admission.   * I certify that at the point of admission it is my clinical judgment that the patient will require inpatient hospital care spanning beyond 2 midnights from the point of admission due to high intensity of service, high risk for further deterioration and high frequency of surveillance required.DEWAINE Marsa KATHEE Seena MD [facility]  How to contact the [facility] Attending or Consulting provider 7A - 7P or covering provider during after hours 7P -7A, for this patient?   Check the care team in [facility] and look for a) attending/consulting [facility] provider listed and b) the [facility] team listed Log into [email or web address] and use [facility] universal password to access. If you do not have the password, please contact the hospital operator. Locate the [facility] provider you are looking for under [facility] and page to a number that you can be directly reached. If you still have difficulty  reaching the provider, please page the [facility] (Director on Call) for the Hospitalists listed on amion for assistance.  02/10/2024, 7:31 PM       [1]  Allergies Allergen Reactions   Codeine Nausea And Vomiting   Lortab [Hydrocodone -Acetaminophen ] Nausea And Vomiting   "

## 2024-02-10 NOTE — ED Provider Notes (Signed)
 " [facility] Provider Note   CSN: [id number] Arrival date & time: 02/10/24  1432     Patient presents with: Shortness of Breath   Kirk [name] is a 64 y.o. male with past medical history of hypertension, hyperlipidemia, history of kidney transplant, diabetes, ESRD on dialysis, heart failure, NSTEMI, who presents emergency department for evaluation of not feeling well.  Patient reports that he has been sick with a cough for the last 2 weeks, but the cough has worsened today.  He states he is coughing up yellow mucus.  Patient states that he has been mildly short of breath as well.  He denies any sick contacts to his knowledge.  Patient states that he does occasionally wear oxygen  at home, around 4 L when he feels like he needs it.  Per triage note, others are home sick as well.  He denies any chest pain, nausea/vomiting, abdominal pain, headache, chills or bodyaches.  Patient states his last dialysis session was earlier today.  However, per triage note patient only received partial treatment today, his last full treatment was on Friday.   Shortness of Breath Associated symptoms: cough        Prior to Admission medications  Medication Sig Start Date End Date Taking? Authorizing Provider  acetaminophen  (TYLENOL ) 500 MG tablet Take 1,000 mg by mouth 2 (two) times daily.   Yes [provider]  albuterol  (PROVENTIL ) (2.5 MG/3ML) 0.083% nebulizer solution Take 3 mLs (2.5 mg total) by nebulization every 4 (four) hours as needed for wheezing or shortness of breath. 06/25/23  Yes Elgergawy, Brayton RAMAN, MD  aspirin  EC 81 MG tablet Take 81 mg by mouth daily at 6 PM.   Yes [provider]  diphenhydrAMINE  (BENADRYL ) 25 MG tablet Take 50-75 mg by mouth 2 (two) times daily.   Yes [provider]  DM-Doxylamine-Acetaminophen  (NYQUIL HBP COLD & FLU PO) Take 30 mLs by mouth every 12 (twelve) hours as needed (cough).   Yes [provider]  doxazosin  (CARDURA ) 4 MG tablet Take 4 mg by mouth daily.   Yes [provider]  hydrALAZINE  (APRESOLINE ) 100 MG tablet Take 1 tablet (100 mg total) by mouth 3 (three) times daily. Patient taking differently: Take 100 mg by mouth 2 (two) times daily. 12/18/23 02/10/24 Yes Pahwani, Ravi, MD  multivitamin (RENA-VIT) TABS tablet Take 1 tablet by mouth daily. 01/29/23  Yes Ngetich, Dinah C, NP  oxyCODONE  (ROXICODONE ) 5 MG immediate release tablet Take 1 tablet (5 mg total) by mouth every 4 (four) hours as needed for severe pain (pain score 7-10). 11/15/23  Yes Freddi Hamilton, MD  sevelamer  carbonate (RENVELA ) 2.4 g PACK Take 4.8 g by mouth 3 (three) times daily with meals. Also takes with his snacks   Yes [provider]  doxazosin  (CARDURA ) 8 MG tablet Take 1 tablet (8 mg total) by mouth at bedtime. Patient not taking: Reported on 01/30/2024 12/18/23   Pahwani, Ravi, MD  metoprolol  succinate (TOPROL -XL) 25 MG 24 hr tablet Take 1.5 tablets (37.5 mg total) by mouth daily. Patient not taking: Reported on 02/10/2024 12/18/23 02/10/24  Vernon Ranks, MD  OXYGEN  Inhale 3 L/min into the lungs continuous.    [provider]  UNABLE TO FIND Inhale 1 Device into the lungs at bedtime. CPAP    [provider]    Allergies: Codeine and Lortab [hydrocodone -acetaminophen ]    Review of Systems  Respiratory:  Positive for cough and shortness of breath.  Updated Vital Signs BP (!) 147/62   Pulse 82   Temp 99.2 F (37.3 C) (Oral)   Resp (!) 21   SpO2 100%   Physical Exam Vitals and nursing note reviewed.  Constitutional:      Appearance: Normal appearance.  HENT:     Head: Normocephalic and atraumatic.     Mouth/Throat:     Mouth: Mucous membranes are moist.  Eyes:     General: No scleral icterus.       Right eye: No discharge.        Left eye: No discharge.     Conjunctiva/sclera: Conjunctivae normal.  Cardiovascular:     Rate and Rhythm: Regular  rhythm. Tachycardia present.     Pulses: Normal pulses.     Comments: Patient is tachycardic with shifting in bed Pulmonary:     Effort: Pulmonary effort is normal. Tachypnea present.     Breath sounds: Normal breath sounds.  Abdominal:     General: There is no distension.     Tenderness: There is no abdominal tenderness.  Musculoskeletal:        General: No deformity.     Cervical back: Normal range of motion.  Skin:    General: Skin is warm and dry.     Capillary Refill: Capillary refill takes less than 2 seconds.  Neurological:     Mental Status: He is alert.     Motor: No weakness.  Psychiatric:        Mood and Affect: Mood normal.     (all labs ordered are listed, but only abnormal results are displayed) Labs Reviewed  COMPREHENSIVE METABOLIC PANEL WITH GFR - Abnormal; Notable for the following components:      Result Value   Chloride 93 (*)    Glucose, Bld 114 (*)    Creatinine, Ser 5.15 (*)    Calcium  8.7 (*)    GFR, Estimated 12 (*)    All other components within normal limits  CBC WITH DIFFERENTIAL/PLATELET - Abnormal; Notable for the following components:   WBC 2.6 (*)    RBC 2.75 (*)    Hemoglobin 9.0 (*)    HCT 27.4 (*)    Platelets 71 (*)    Lymphs Abs 0.2 (*)    All other components within normal limits  PRO BRAIN NATRIURETIC PEPTIDE - Abnormal; Notable for the following components:   Pro Brain Natriuretic Peptide >35,000.0 (*)    All other components within normal limits  RESP PANEL BY RT-PCR (RSV, FLU A&B, COVID)  RVPGX2  CULTURE, BLOOD (ROUTINE X 2)  CULTURE, BLOOD (ROUTINE X 2)  PROTIME-INR  I-STAT CG4 LACTIC ACID, ED  I-STAT CG4 LACTIC ACID, ED    EKG: None  Radiology: CT CHEST ABDOMEN PELVIS W CONTRAST Result Date: 02/10/2024 CLINICAL DATA:  Shortness of breath EXAM: CT CHEST, ABDOMEN, AND PELVIS WITH CONTRAST TECHNIQUE: Multidetector CT imaging of the chest, abdomen and pelvis was performed following the standard protocol during bolus  administration of intravenous contrast. RADIATION DOSE REDUCTION: This exam was performed according to the departmental dose-optimization program which includes automated exposure control, adjustment of the mA and/or kV according to patient size and/or use of iterative reconstruction technique. CONTRAST:  75mL OMNIPAQUE  IOHEXOL  350 MG/ML SOLN COMPARISON:  Chest x-ray 02/10/2024, chest CT 11/19/2023, 05/06/2020 FINDINGS: CT CHEST FINDINGS Cardiovascular: Moderate aortic atherosclerosis. No aneurysm. Multi vessel coronary vascular calcification. Cardiomegaly. Aortic valvular calcification. Mitral calcification. No pericardial effusion. Mediastinum/Nodes: Patent trachea. No suspicious thyroid  mass. Mildly enlarged mediastinal lymph  nodes, precarinal node measures 14 mm. Subcarinal lymph node measures 15 mm. Esophagus within normal limits. Lungs/Pleura: Limited assessment of the parenchyma due to motion degradation. Mild subpleural reticulation and peribronchovascular clustered nodularity some of which is chronic and was seen on exam from 2022. However, small focus of airspace consolidation within the right middle lobe and overall increased peribronchovascular nodularity and airspace disease, greatest within the right middle lobe and bilateral lower lobes suspicious for acute respiratory infection/pneumonia. No pleural effusion or pneumothorax Musculoskeletal: No acute or suspicious osseous abnormality. CT ABDOMEN PELVIS FINDINGS Hepatobiliary: No focal liver abnormality is seen. Status post cholecystectomy. No biliary dilatation. Pancreas: Unremarkable. No pancreatic ductal dilatation or surrounding inflammatory changes. Spleen: Upper normal in size to slightly enlarged at 13 cm Adrenals/Urinary Tract: Inter adrenal glands are normal. Atrophic native kidneys with numerous stones as well as cysts and subcentimeter hypodensities too small to further characterize, no specific imaging follow-up is recommended. Calcific  mass anterior to the right psoas muscle suspected to represent failed renal transplant. Bladder is decompressed Stomach/Bowel: Stomach within normal limits. No dilated small bowel. No acute bowel wall thickening. Diverticular disease of the left colon Vascular/Lymphatic: Aortic atherosclerosis. No enlarged abdominal or pelvic lymph nodes. Reproductive: Enlarged prostate Other: No ascites or free air. Redemonstrated 2.6 cm cystic collection within the subcutaneous soft tissues of the medial left gluteal fold without substantial surrounding inflammation. Musculoskeletal: No acute or suspicious osseous abnormality IMPRESSION: 1. Limited assessment of the lung parenchyma due to motion degradation. Mild subpleural reticulation and peribronchovascular clustered nodularity some of which is chronic and was seen on exam from 2022. There is however, small focus of airspace consolidation within the right middle lobe and overall increased peribronchovascular nodularity and airspace disease, greatest within the right middle lobe and bilateral lower lobes suspicious for acute respiratory infection/pneumonia superimposed on underlying chronic change. 2. Mildly enlarged mediastinal lymph nodes, likely reactive. 3. No CT evidence for acute intra-abdominal or pelvic abnormality. 4. Atrophic native kidneys with numerous stones. Calcific mass anterior to the right psoas muscle suspected to represent failed renal transplant. 5. Stable 2.6 cm fluid density lesion within the subcutaneous soft tissues of the medial left gluteal cleft, without significant surrounding soft tissue inflammatory process, possibly a subcutaneous cysts, correlate with direct inspection. 6. Diverticular disease of the left colon without acute inflammatory process. 7. Aortic atherosclerosis. Aortic Atherosclerosis (ICD10-I70.0). Electronically Signed   By: Luke Bun M.D.   On: 02/10/2024 17:58   DG Chest 2 View if patient is not in a treatment room. Result  Date: 02/10/2024 CLINICAL DATA:  Suspected sepsis shortness of breath and cough EXAM: DG CHEST 2V COMPARISON:  12/09/2023 FINDINGS: Hardware in the cervical spine. Cardiomegaly with central congestion and mild patchy mid to lower lung opacity. No pleural effusion or pneumothorax IMPRESSION: Cardiomegaly with central congestion. Mild patchy mid to lower lung opacity may be due to edema or infection. Electronically Signed   By: Luke Bun M.D.   On: 02/10/2024 16:09     .Critical Care  Performed by: Torrence Marry RAMAN, PA-C Authorized by: Torrence Marry RAMAN, PA-C   Critical care provider statement:    Critical care time (minutes):  30   Critical care was necessary to treat or prevent imminent or life-threatening deterioration of the following conditions:  Sepsis   Critical care was time spent personally by me on the following activities:  Development of treatment plan with patient or surrogate, discussions with consultants, evaluation of patient's response to treatment, examination of patient, ordering  and review of laboratory studies, ordering and review of radiographic studies, ordering and performing treatments and interventions, pulse oximetry, re-evaluation of patient's condition, review of old charts, discussions with primary provider and blood draw for specimens   I assumed direction of critical care for this patient from another provider in my specialty: no     Care discussed with: admitting provider      Medications Ordered in the ED  vancomycin  (VANCOREADY) IVPB 1500 mg/300 mL (1,500 mg Intravenous New Bag/Given 02/10/24 1834)  HYDROmorphone  (DILAUDID ) injection 1 mg (has no administration in time range)  acetaminophen  (TYLENOL ) tablet 1,000 mg (1,000 mg Oral Given 02/10/24 1548)  ceFEPIme  (MAXIPIME ) 2 g in sodium chloride  0.9 % 100 mL IVPB (0 g Intravenous Stopped 02/10/24 1725)  metroNIDAZOLE  (FLAGYL ) IVPB 500 mg (0 mg Intravenous Stopped 02/10/24 1816)  furosemide  (LASIX ) injection  40 mg (40 mg Intravenous Given 02/10/24 1805)  iohexol  (OMNIPAQUE ) 350 MG/ML injection 75 mL (75 mLs Intravenous Contrast Given 02/10/24 1731)                                 Medical Decision Making Amount and/or Complexity of Data Reviewed Labs: ordered. Radiology: ordered.  Risk OTC drugs. Prescription drug management. Decision regarding hospitalization.   This patient presents to the ED for concern of cough/shortness of breath, this involves an extensive number of treatment options, and is a complaint that carries with it a high risk of complications and morbidity.  Differential diagnosis includes: COPD exacerbation, heart failure exacerbation, pneumonia, PE, URI, flu, COVID, pneumothorax, sepsis  Co morbidities:  see above   Additional history:  Patient was admitted most recently on 01/29/2024 for gross hematuria  Lab Tests:  I Ordered, and personally interpreted labs.  The pertinent results include:    - Leukopenia: 2.6 - BNP: Greater than 35,000  Imaging Studies:  I ordered imaging studies including chest x-ray and CT chest abdomen pelvis I independently visualized and interpreted imaging which showed concern for fluid overload versus pneumonia.  CT does show concern for infection I agree with the radiologist interpretation  Cardiac Monitoring/ECG:  The patient was maintained on a cardiac monitor.  I personally viewed and interpreted the cardiac monitored which showed an underlying rhythm of: Sinus rhythm  Medicines ordered and prescription drug management:  I ordered medication including  Medications  vancomycin  (VANCOREADY) IVPB 1500 mg/300 mL (1,500 mg Intravenous New Bag/Given 02/10/24 1834)  HYDROmorphone  (DILAUDID ) injection 1 mg (has no administration in time range)  acetaminophen  (TYLENOL ) tablet 1,000 mg (1,000 mg Oral Given 02/10/24 1548)  ceFEPIme  (MAXIPIME ) 2 g in sodium chloride  0.9 % 100 mL IVPB (0 g Intravenous Stopped 02/10/24 1725)  metroNIDAZOLE   (FLAGYL ) IVPB 500 mg (0 mg Intravenous Stopped 02/10/24 1816)  furosemide  (LASIX ) injection 40 mg (40 mg Intravenous Given 02/10/24 1805)  iohexol  (OMNIPAQUE ) 350 MG/ML injection 75 mL (75 mLs Intravenous Contrast Given 02/10/24 1731)   for fever Reevaluation of the patient after these medicines showed that the patient improved I have reviewed the patients home medicines and have made adjustments as needed  Test Considered:   none  Critical Interventions:   sepsis interventions including antibiotics - Nearly elevated BNP, greater than 35,000  Consultations Obtained: -Hospitalist - Nephrology  Problem List / ED Course:     ICD-10-CM   1. Sepsis without acute organ dysfunction, due to unspecified organism (HCC)  A41.9     2. Pneumonia due to infectious  organism, unspecified laterality, unspecified part of lung  J18.9     3. Acute on chronic congestive heart failure, unspecified heart failure type (HCC)  I50.9       MDM: 64 year old male who presents emergency department for evaluation of shortness of breath, cough and fever.  Symptoms have been present for the last 2 weeks.  Septic workup has been started given patient is febrile to 102.4.    Patient's BNP is greater than 35,000.  He does not currently take daily IV Lasix .  I did order 40 mg of IV to treat patient's fluid overload.  Patient given Tylenol  for fever.  upon reassessment, patient remains febrile at 101.8.  I am unable to give him NSAIDs due to his poor renal function.  Patient's chest x-ray shows concern for fluid overload versus infiltrate suggestive of infection, so I ordered a CT of his chest, abdomen and pelvis.  However, he does meet sepsis criteria given his fever, tachypnea and leukopenia of 2.6 so he has been started on broad-spectrum antibiotics.  Patient will require hospital admission.  I spoke with Dr. Melvin, who will be the excepting provider.  18:40 Patient's CT scan does show concern for pneumonia.  I did  speak with the hospitalist, who will admit this patient.  Patient requested additional pain medicine for his low back.  Consult to nephrology has also been placed as patient will likely require dialysis as soon as hemodynamically stable.  His last full session was on Friday and he only received a partial session today.  Dispostion:  After consideration of the diagnostic results and the patients response to treatment, I feel that the patient would benefit from hospital admission.   Final diagnoses:  Sepsis without acute organ dysfunction, due to unspecified organism [facility])  Pneumonia due to infectious organism, unspecified laterality, unspecified part of lung  Acute on chronic congestive heart failure, unspecified heart failure type [facility])    ED Discharge Orders     None          Torrence Marry GORMAN DEVONNA 02/10/24 2323    Rogelia Jerilynn GORMAN, MD 02/21/24 1502  "

## 2024-02-10 NOTE — ED Notes (Signed)
 Patient transported to X-ray

## 2024-02-11 DIAGNOSIS — A419 Sepsis, unspecified organism: Secondary | ICD-10-CM | POA: Diagnosis not present

## 2024-02-11 DIAGNOSIS — J189 Pneumonia, unspecified organism: Secondary | ICD-10-CM | POA: Diagnosis not present

## 2024-02-11 LAB — COMPREHENSIVE METABOLIC PANEL WITH GFR
ALT: 9 U/L (ref 0–44)
AST: 17 U/L (ref 15–41)
Albumin: 3.9 g/dL (ref 3.5–5.0)
Alkaline Phosphatase: 67 U/L (ref 38–126)
Anion gap: 16 — ABNORMAL HIGH (ref 5–15)
BUN: 26 mg/dL — ABNORMAL HIGH (ref 8–23)
CO2: 27 mmol/L (ref 22–32)
Calcium: 8.4 mg/dL — ABNORMAL LOW (ref 8.9–10.3)
Chloride: 90 mmol/L — ABNORMAL LOW (ref 98–111)
Creatinine, Ser: 6.97 mg/dL — ABNORMAL HIGH (ref 0.61–1.24)
GFR, Estimated: 8 mL/min — ABNORMAL LOW
Glucose, Bld: 118 mg/dL — ABNORMAL HIGH (ref 70–99)
Potassium: 4.1 mmol/L (ref 3.5–5.1)
Sodium: 133 mmol/L — ABNORMAL LOW (ref 135–145)
Total Bilirubin: 0.3 mg/dL (ref 0.0–1.2)
Total Protein: 6.9 g/dL (ref 6.5–8.1)

## 2024-02-11 LAB — CBC
HCT: 26.9 % — ABNORMAL LOW (ref 39.0–52.0)
Hemoglobin: 8.8 g/dL — ABNORMAL LOW (ref 13.0–17.0)
MCH: 33.3 pg (ref 26.0–34.0)
MCHC: 32.7 g/dL (ref 30.0–36.0)
MCV: 101.9 fL — ABNORMAL HIGH (ref 80.0–100.0)
Platelets: 63 K/uL — ABNORMAL LOW (ref 150–400)
RBC: 2.64 MIL/uL — ABNORMAL LOW (ref 4.22–5.81)
RDW: 14.6 % (ref 11.5–15.5)
WBC: 2.2 K/uL — ABNORMAL LOW (ref 4.0–10.5)
nRBC: 0 % (ref 0.0–0.2)

## 2024-02-11 LAB — GLUCOSE, CAPILLARY
Glucose-Capillary: 103 mg/dL — ABNORMAL HIGH (ref 70–99)
Glucose-Capillary: 93 mg/dL (ref 70–99)

## 2024-02-11 LAB — CBG MONITORING, ED
Glucose-Capillary: 132 mg/dL — ABNORMAL HIGH (ref 70–99)
Glucose-Capillary: 105 mg/dL — ABNORMAL HIGH (ref 70–99)

## 2024-02-11 LAB — PROTIME-INR
INR: 1.1 (ref 0.8–1.2)
Prothrombin Time: 15.3 s — ABNORMAL HIGH (ref 11.4–15.2)

## 2024-02-11 LAB — HEMOGLOBIN A1C
Hgb A1c MFr Bld: 5 % (ref 4.8–5.6)
Mean Plasma Glucose: 96.8 mg/dL

## 2024-02-11 LAB — CORTISOL-AM, BLOOD: Cortisol - AM: 14.4 ug/dL (ref 6.7–22.6)

## 2024-02-11 LAB — PROCALCITONIN: Procalcitonin: 1.68 ng/mL

## 2024-02-11 MED ORDER — DM-GUAIFENESIN ER 30-600 MG PO TB12
1.0000 | ORAL_TABLET | Freq: Two times a day (BID) | ORAL | Status: DC
  Administered 2024-02-11 – 2024-02-13 (×5): 1 via ORAL
  Filled 2024-02-11 (×6): qty 1

## 2024-02-11 MED ORDER — METOPROLOL SUCCINATE ER 25 MG PO TB24
25.0000 mg | ORAL_TABLET | Freq: Every day | ORAL | Status: DC
  Administered 2024-02-11 – 2024-02-15 (×4): 25 mg via ORAL
  Filled 2024-02-11 (×4): qty 1

## 2024-02-11 NOTE — ED Notes (Signed)
 Morning dose heparin  held based on most recent CBC results. Platelets 63. Provider confirmed hold dose.

## 2024-02-11 NOTE — Plan of Care (Signed)
  Problem: Fluid Volume: Goal: Hemodynamic stability will improve Outcome: Progressing   Problem: Clinical Measurements: Goal: Signs and symptoms of infection will decrease Outcome: Progressing   Problem: Respiratory: Goal: Ability to maintain adequate ventilation will improve Outcome: Progressing   

## 2024-02-11 NOTE — Progress Notes (Signed)
" ° °  Brief Progress Note   _____________________________________________________________________________________________________________  Patient Name: Kirk [name] Patient DOB: Jun 17, 1960 Date: @TODAY @      Data: Reviewed labs, notes, VS.    Action: No action needed at this time.      Response:    _____________________________________________________________________________________________________________  [facility] RN Expeditor Sharolyn JONETTA Batman Please contact us  directly via secure chat (search for [facility]) or by calling us  at [phone] [facility]).  "

## 2024-02-11 NOTE — Plan of Care (Signed)
" °  Problem: Respiratory: Goal: Ability to maintain adequate ventilation will improve Outcome: Progressing   Problem: Coping: Goal: Ability to adjust to condition or change in health will improve Outcome: Progressing   Problem: Education: Goal: Knowledge of General Education information will improve Description: Including pain rating scale, medication(s)/side effects and non-pharmacologic comfort measures Outcome: Progressing   Problem: Activity: Goal: Risk for activity intolerance will decrease Outcome: Progressing   Problem: Safety: Goal: Ability to remain free from injury will improve Outcome: Progressing   "

## 2024-02-11 NOTE — ED Notes (Signed)
 Pt moved to hospital bed.

## 2024-02-11 NOTE — ED Notes (Signed)
 Patient with productive cough, requesting medication. Pt informed no PRN orders at this time but would reach out to MD with request. Secure chat sent to MD at this time. Awaiting new orders.

## 2024-02-11 NOTE — Progress Notes (Addendum)
 " Progress Note   Patient: Kirk [name] FMW:[id number] DOB: 1960-04-24 DOA: 02/10/2024     1 DOS: the patient was seen and examined on 02/11/2024   Brief hospital course:  63 y.o. male with medical history significant of hypertension, hyperlipidemia, diabetes, paroxysmal atrial fibrillation, CAD, chronic combined systolic and diastolic CHF, chronic respiratory failure with hypoxia, PTSD, depression, ESRD on HD, COPD/asthma, OSA, OHS, obesity, seizures, subdural hematoma, pancytopenia presenting with shortness of breath and cough.  CT of the chest abdomen pelvis showed motion degraded exam with chronic nodularity as well as new consolidation on top of this chronic nodularity suspicious for infection/pneumonia. Also noted was likely reactive lymphadenopathy. No acute abnormality of the abdomen pelvis but did demonstrate stable 2.6 mm fluid density in the soft tissues of the gluteal cleft. Finding consistent with failed renal transplant also noted.  Nephrology consulted for management of hemodialysis. He tested positive for metapneumovirus. Admitted for management of metapneumovirus, viral illness as well as acute on chronic congestive heart failure.   Assessment and Plan:  Sepsis likely secondary to community-acquired possible bacterial pneumonia, POA: Metapneumovirus infection, POA: Chronic respiratory failure with hypoxia Chest x-ray and CT chest consistent with superimposed pneumonia on chronic changes. Viral panel for flu COVID and RSV negative. 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 Imaging with possible pulmonary edema and proBNP elevated to greater than 35,000. - Nephrology on board, appreciate assistance - Plan for continued volume management with HD - Trend renal function and electrolytes - Strict I's and O's, daily weights  Hypertension - Continue with hydralazine  100 mg p.o. twice daily as well as metoprolol  succinate 25 mg daily  Hyperlipidemia CAD  Nonobstructive CAD by cath in 2025. Continue with metoprolol  succinate 25 mg daily - Continue home ASA   Diabetes mellitus type 2 - SSI   Pancytopenia Has been worked up by oncology with unrevealing workup.   - Continue to trend CBC   COPD Asthma - Continue as needed albuterol    OSA OHS - Continue home CPAP    History of seizure disorder - History of traumatic SDH  Disposition: Home. Lives with his wife.     Subjective: Complaining of cough. He has tested positive for metapneumovirus and I told him about that. He has been feeling sick for the past two weeks now.   Physical Exam: Vitals:   02/11/24 0700 02/11/24 0715 02/11/24 0819 02/11/24 0900  BP: (!) 160/73   (!) 155/65  Pulse: 79 81  88  Resp: (!) 26 19  12   Temp:   98.7 F (37.1 C)   TempSrc:   Oral   SpO2: 94% 95%  100%  Weight:      Height:       Constitutional: NAD, calm, comfortable Eyes: PERRL, lids and conjunctivae normal ENMT: Mucous membranes are moist. Posterior pharynx clear of any exudate or lesions.Normal dentition.  Neck: normal, supple, no masses, no thyromegaly Respiratory: slightly coarse breath sounds bilaterally Cardiovascular: Regular rate and rhythm, no murmurs / rubs / gallops. No extremity edema. 2+ pedal pulses. No carotid bruits.  Abdomen: no tenderness, no masses palpated.  No hepatosplenomegaly. Bowel sounds positive.  Musculoskeletal: no clubbing / cyanosis. No joint deformity upper and lower extremities. Good ROM, no contractures. Normal muscle  tone.  Skin: no rashes, lesions, ulcers. No induration Neurologic: CN 2-12 grossly intact. Sensation intact, DTR normal. Strength 5/5 x all 4 extremities.   Data Reviewed:  There are no new results to review at this time.  Family Communication: None at the bedside  Disposition: Status is: Inpatient Remains inpatient appropriate because: Viral illness, Acute CHF  Planned Discharge Destination: Home    Time spent: 40 minutes  Author: Deliliah Room, MD 02/11/2024 10:26 AM  For on call review [email or web address].  "

## 2024-02-12 DIAGNOSIS — I10 Essential (primary) hypertension: Secondary | ICD-10-CM | POA: Diagnosis not present

## 2024-02-12 DIAGNOSIS — N186 End stage renal disease: Secondary | ICD-10-CM | POA: Diagnosis not present

## 2024-02-12 DIAGNOSIS — I5023 Acute on chronic systolic (congestive) heart failure: Secondary | ICD-10-CM

## 2024-02-12 DIAGNOSIS — Z992 Dependence on renal dialysis: Secondary | ICD-10-CM | POA: Diagnosis not present

## 2024-02-12 DIAGNOSIS — G4733 Obstructive sleep apnea (adult) (pediatric): Secondary | ICD-10-CM | POA: Diagnosis not present

## 2024-02-12 DIAGNOSIS — J441 Chronic obstructive pulmonary disease with (acute) exacerbation: Secondary | ICD-10-CM

## 2024-02-12 DIAGNOSIS — I480 Paroxysmal atrial fibrillation: Secondary | ICD-10-CM | POA: Diagnosis not present

## 2024-02-12 DIAGNOSIS — E1121 Type 2 diabetes mellitus with diabetic nephropathy: Secondary | ICD-10-CM

## 2024-02-12 DIAGNOSIS — I2510 Atherosclerotic heart disease of native coronary artery without angina pectoris: Secondary | ICD-10-CM | POA: Diagnosis not present

## 2024-02-12 LAB — HEPATITIS B SURFACE ANTIGEN: Hepatitis B Surface Ag: NONREACTIVE

## 2024-02-12 LAB — CBC WITH DIFFERENTIAL/PLATELET
Abs Immature Granulocytes: 0.01 K/uL (ref 0.00–0.07)
Basophils Absolute: 0 K/uL (ref 0.0–0.1)
Basophils Relative: 1 %
Eosinophils Absolute: 0 K/uL (ref 0.0–0.5)
Eosinophils Relative: 2 %
HCT: 26.5 % — ABNORMAL LOW (ref 39.0–52.0)
Hemoglobin: 8.7 g/dL — ABNORMAL LOW (ref 13.0–17.0)
Immature Granulocytes: 1 %
Lymphocytes Relative: 30 %
Lymphs Abs: 0.5 K/uL — ABNORMAL LOW (ref 0.7–4.0)
MCH: 33 pg (ref 26.0–34.0)
MCHC: 32.8 g/dL (ref 30.0–36.0)
MCV: 100.4 fL — ABNORMAL HIGH (ref 80.0–100.0)
Monocytes Absolute: 0.3 K/uL (ref 0.1–1.0)
Monocytes Relative: 17 %
Neutro Abs: 0.8 K/uL — ABNORMAL LOW (ref 1.7–7.7)
Neutrophils Relative %: 49 %
Platelets: 61 K/uL — ABNORMAL LOW (ref 150–400)
RBC: 2.64 MIL/uL — ABNORMAL LOW (ref 4.22–5.81)
RDW: 14.5 % (ref 11.5–15.5)
Smear Review: NORMAL
WBC: 1.7 K/uL — ABNORMAL LOW (ref 4.0–10.5)
nRBC: 0 % (ref 0.0–0.2)

## 2024-02-12 LAB — GLUCOSE, CAPILLARY
Glucose-Capillary: 167 mg/dL — ABNORMAL HIGH (ref 70–99)
Glucose-Capillary: 114 mg/dL — ABNORMAL HIGH (ref 70–99)
Glucose-Capillary: 137 mg/dL — ABNORMAL HIGH (ref 70–99)
Glucose-Capillary: 161 mg/dL — ABNORMAL HIGH (ref 70–99)

## 2024-02-12 LAB — BASIC METABOLIC PANEL WITH GFR
Anion gap: 18 — ABNORMAL HIGH (ref 5–15)
BUN: 42 mg/dL — ABNORMAL HIGH (ref 8–23)
CO2: 26 mmol/L (ref 22–32)
Calcium: 8.9 mg/dL (ref 8.9–10.3)
Chloride: 90 mmol/L — ABNORMAL LOW (ref 98–111)
Creatinine, Ser: 9.6 mg/dL — ABNORMAL HIGH (ref 0.61–1.24)
GFR, Estimated: 6 mL/min — ABNORMAL LOW
Glucose, Bld: 102 mg/dL — ABNORMAL HIGH (ref 70–99)
Potassium: 4.2 mmol/L (ref 3.5–5.1)
Sodium: 134 mmol/L — ABNORMAL LOW (ref 135–145)

## 2024-02-12 LAB — PATHOLOGIST SMEAR REVIEW

## 2024-02-12 MED ORDER — CHLORHEXIDINE GLUCONATE CLOTH 2 % EX PADS
6.0000 | MEDICATED_PAD | Freq: Every day | CUTANEOUS | Status: DC
  Administered 2024-02-12 – 2024-02-13 (×2): 6 via TOPICAL

## 2024-02-12 MED ORDER — BUDESONIDE 0.25 MG/2ML IN SUSP
0.2500 mg | Freq: Two times a day (BID) | RESPIRATORY_TRACT | Status: DC
  Administered 2024-02-12 – 2024-02-15 (×7): 0.25 mg via RESPIRATORY_TRACT
  Filled 2024-02-12 (×7): qty 2

## 2024-02-12 MED ORDER — ANTICOAGULANT SODIUM CITRATE 4% (200MG/5ML) IV SOLN: 5.0000 mL | Status: DC | PRN

## 2024-02-12 MED ORDER — DOXYCYCLINE HYCLATE 100 MG PO TABS
100.0000 mg | ORAL_TABLET | Freq: Two times a day (BID) | ORAL | Status: DC
  Administered 2024-02-12: 100 mg via ORAL
  Filled 2024-02-12: qty 1

## 2024-02-12 MED ORDER — LIDOCAINE HCL (PF) 1 % IJ SOLN: 5.0000 mL | INTRAMUSCULAR | Status: DC | PRN

## 2024-02-12 MED ORDER — HEPARIN SODIUM (PORCINE) 1000 UNIT/ML DIALYSIS: 1000.0000 [IU] | INTRAMUSCULAR | Status: DC | PRN

## 2024-02-12 MED ORDER — ALTEPLASE 2 MG IJ SOLR: 2.0000 mg | Freq: Once | INTRAMUSCULAR | Status: DC | PRN

## 2024-02-12 MED ORDER — ISOSORBIDE MONONITRATE ER 30 MG PO TB24
15.0000 mg | ORAL_TABLET | Freq: Every day | ORAL | Status: DC
  Administered 2024-02-12 – 2024-02-13 (×2): 15 mg via ORAL
  Filled 2024-02-12 (×2): qty 1

## 2024-02-12 MED ORDER — NEPRO/CARBSTEADY PO LIQD: 237.0000 mL | ORAL | Status: DC | PRN

## 2024-02-12 MED ORDER — IPRATROPIUM-ALBUTEROL 0.5-2.5 (3) MG/3ML IN SOLN
3.0000 mL | Freq: Four times a day (QID) | RESPIRATORY_TRACT | Status: DC
  Administered 2024-02-12 – 2024-02-13 (×3): 3 mL via RESPIRATORY_TRACT
  Filled 2024-02-12 (×3): qty 3

## 2024-02-12 MED ORDER — PENTAFLUOROPROP-TETRAFLUOROETH EX AERO: 1.0000 | INHALATION_SPRAY | CUTANEOUS | Status: DC | PRN

## 2024-02-12 MED ORDER — LIDOCAINE-PRILOCAINE 2.5-2.5 % EX CREA: 1.0000 | TOPICAL_CREAM | CUTANEOUS | Status: DC | PRN

## 2024-02-12 MED ORDER — HEPARIN SODIUM (PORCINE) 1000 UNIT/ML DIALYSIS
2500.0000 [IU] | Freq: Once | INTRAMUSCULAR | Status: AC
  Administered 2024-02-12: 2500 [IU] via INTRAVENOUS_CENTRAL

## 2024-02-12 MED ORDER — METHYLPREDNISOLONE SODIUM SUCC 40 MG IJ SOLR
40.0000 mg | Freq: Two times a day (BID) | INTRAMUSCULAR | Status: DC
  Administered 2024-02-12 – 2024-02-13 (×4): 40 mg via INTRAVENOUS
  Filled 2024-02-12 (×4): qty 1

## 2024-02-12 NOTE — Assessment & Plan Note (Signed)
No chest pain, no acute coronary syndrome.  

## 2024-02-12 NOTE — Assessment & Plan Note (Signed)
Continue Cpap

## 2024-02-12 NOTE — Progress Notes (Signed)
 Pt receives out-pt HD at [facility], MWF, 1120am chair time. Will continue to assist as needed.   Lavanda Laureano Hetzer Dialysis Navigator [phone]

## 2024-02-12 NOTE — Assessment & Plan Note (Signed)
 Continue metoprolol  for rate control, patient is not on DOAC. Continue aspirin 

## 2024-02-12 NOTE — Plan of Care (Signed)
  Problem: Fluid Volume: Goal: Hemodynamic stability will improve Outcome: Progressing   Problem: Clinical Measurements: Goal: Diagnostic test results will improve Outcome: Progressing   Problem: Respiratory: Goal: Ability to maintain adequate ventilation will improve Outcome: Progressing

## 2024-02-12 NOTE — Consult Note (Signed)
 Renal Service Consult Note [facility] [name], MD  Patient: Kirk [name] List Date: 02/12/2024 Requesting Physician: Dr. Noralee  Reason for Consult: [name] w/ pneumonia, syst CHF and poss vol overload HPI: The patient is a 64 y.o. year-old w/ PMH as below who presented to ED 1/19 afternoon c/o SOB and cough for 1 week. 89% on RA, temp 102 deg F.  In the ED BP was 149/63, HR 91, RR 20-28, temp 102.4 degrees.  Labs showed K+ 3.5, BUN 17, creatinine 5.1, albumin  4.1.  Lactic acid was 0.9, WBC 2K, Hgb 9.0.  Flu, COVID and RSV were negative.  Chest x-ray showed a mild vascular congestion and no pulmonary edema.  Patient received vancomycin  and cefepime  and Flagyl  in the ED.  Patient was admitted.  Yesterday 08/23/2023 patient's respiratory panel came back positive for metapneumovirus.  Today I received a secure chat from the RN asking for dialysis for this patient.  Nephrology is consulting for dialysis.   Pt seen in room. Says she is having sig issues with his breathing. He thinks the IV fluids pushed me over.  Is using [place] O2 and trading back and forth w/ BiPap this am. No CP. No abd pain.    ROS - denies CP, no joint pain, no HA, no blurry vision, no rash, no diarrhea, no nausea/ vomiting   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)    Anxiety    Asthma    Asthma    moderate persistent   Bacteremia 12/16/2023   CAP (community acquired pneumonia) 05/30/2017   Carpal tunnel syndrome    Cellulitis and abscess of trunk    Cholelithiasis 07/13/2014   Chronic headaches    Cigarette smoker 07/11/2017   Stopped 2/022  - referred to start Lung cancer screening 04/2021         Debility, unspecified    Dermatophytosis of the body    Dysrhythmia    history of   Edema    End stage renal disease on  dialysis (HCC)    MWF; [facility] in [place] (10/21/2014)   Essential hypertension, benign    Generalized anxiety disorder 05/25/2022   GERD (gastroesophageal reflux disease)    Gout, unspecified    History of renal transplant    HTN (hypertension)    Hypertrophy of prostate without urinary obstruction and other lower urinary tract symptoms (LUTS)    Hypotension, unspecified    Hypoxia 03/21/2023   Impotence of organic origin    Insomnia, unspecified    Localization-related (focal) (partial) epilepsy and epileptic syndromes with complex partial seizures, without mention of intractable epilepsy    12-15-19- Wife states he has NEVER had a seizure    Lumbago    Memory loss    OSA on CPAP    Other and unspecified hyperlipidemia    controlled /managed per wife    Other chronic nonalcoholic liver disease    Other malaise and fatigue    Other nonspecific abnormal serum enzyme levels    Pain in joint, lower leg    Pain in joint, upper arm    Pneumonia several times   PONV (postoperative nausea and vomiting)    Rhinovirus infection 10/05/2023   Secondary hyperparathyroidism (of renal origin)    Shortness of breath  Sleep apnea    wears cpap    Tension headache    Unspecified constipation    Unspecified essential hypertension    Unspecified hereditary and idiopathic peripheral neuropathy    Unspecified vitamin D  deficiency    Past Surgical History  Past Surgical History:  Procedure Laterality Date   A/V FISTULAGRAM N/A 03/28/2023   Procedure: A/V Fistulagram;  Surgeon: Melia Lynwood ORN, MD;  Location: [facility] INVASIVE CV LAB;  Service: Cardiovascular;  Laterality: N/A;   A/V FISTULAGRAM Left 11/07/2023   Procedure: A/V Fistulagram;  Surgeon: Serene Gaile ORN, MD;  Location: [facility] PV LAB;  Service: Cardiovascular;  Laterality: Left;   AV FISTULA PLACEMENT Left ?2010   forearm; at [facility]   BACK SURGERY     CARDIAC CATHETERIZATION  03/21/2011   CHOLECYSTECTOMY  N/A 10/21/2014   Procedure: LAPAROSCOPIC CHOLECYSTECTOMY WITH INTRAOPERATIVE CHOLANGIOGRAM;  Surgeon: Deward Null III, MD;  Location: [facility] OR;  Service: General;  Laterality: N/A;   COLONOSCOPY     ESOPHAGOGASTRODUODENOSCOPY N/A 06/21/2023   Procedure: EGD (ESOPHAGOGASTRODUODENOSCOPY);  Surgeon: Legrand Victory LITTIE DOUGLAS, MD;  Location: [facility] ENDOSCOPY;  Service: Gastroenterology;  Laterality: N/A;   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   IR ANGIO INTRA EXTRACRAN SEL INTERNAL CAROTID UNI R MOD SED  11/30/2021   IR ANGIOGRAM FOLLOW UP STUDY  11/30/2021   IR AV DIALY SHUNT INTRO NEEDLE/INTRACATH INITIAL W/PTA/IMG LEFT  12/13/2022   IR BONE MARROW BIOPSY & ASPIRATION  03/27/2022   IR NEURO EACH ADD'L AFTER BASIC UNI RIGHT (MS)  11/30/2021   IR TRANSCATH/EMBOLIZ  11/30/2021   IR US  GUIDE VASC ACCESS LEFT  12/13/2022   KIDNEY TRANSPLANT  08/17/2011   [facility]    LAPAROSCOPIC CHOLECYSTECTOMY  10/21/2014   w/IOC   LEFT HEART CATH AND CORONARY ANGIOGRAPHY N/A 04/09/2023   Procedure: LEFT HEART CATH AND CORONARY ANGIOGRAPHY;  Surgeon: Anner Alm ORN, MD;  Location: [facility];  Service: Cardiovascular;  Laterality: N/A;   LEFT HEART CATH AND CORONARY ANGIOGRAPHY N/A 10/04/2023   Procedure: LEFT HEART CATH AND CORONARY ANGIOGRAPHY;  Surgeon: Anner Alm ORN, MD;  Location: [facility] INVASIVE CV LAB;  Service: Cardiovascular;  Laterality: N/A;   LEFT HEART CATHETERIZATION WITH CORONARY ANGIOGRAM N/A 03/21/2011   Procedure: LEFT HEART CATHETERIZATION WITH CORONARY ANGIOGRAM;  Surgeon: Vinie KYM Maxcy, MD;  Location: [facility] CATH LAB;  Service: Cardiovascular;  Laterality: N/A;   NEPHRECTOMY  08/2013   removed transplaned kidney   PERIPHERAL VASCULAR BALLOON ANGIOPLASTY Left 03/28/2023   Procedure: PERIPHERAL VASCULAR BALLOON ANGIOPLASTY;  Surgeon: Melia Lynwood ORN, MD;  Location: [facility];  Service: Cardiovascular;  Laterality: Left;  80% outflow cephalic   POLYPECTOMY     POSTERIOR FUSION CERVICAL SPINE   06/25/2012   for spinal stenosis   RADIOLOGY WITH ANESTHESIA N/A 11/30/2021   Procedure: RIGHT MIDDLE MENINGEAL ARTERY EMBOLIZATION;  Surgeon: Radiologist, Medication, MD;  Location: [facility] OR;  Service: Radiology;  Laterality: N/A;   RIGHT/LEFT HEART CATH AND CORONARY ANGIOGRAPHY N/A 12/16/2023   Procedure: RIGHT/LEFT HEART CATH AND CORONARY ANGIOGRAPHY;  Surgeon: Anner Alm ORN, MD;  Location: [facility] INVASIVE CV LAB;  Service: Cardiovascular;  Laterality: N/A;   TRANSESOPHAGEAL ECHOCARDIOGRAM (CATH LAB) N/A 12/16/2023   Procedure: TRANSESOPHAGEAL ECHOCARDIOGRAM;  Surgeon: Raford Riggs, MD;  Location: [facility] INVASIVE CV LAB;  Service: Cardiovascular;  Laterality: N/A;   VASECTOMY  2010   VENOUS ANGIOPLASTY  11/07/2023   Procedure: VENOUS ANGIOPLASTY;  Surgeon: Serene Gaile ORN, MD;  Location: [facility] PV LAB;  Service: Cardiovascular;;  cephalic vein   Family History  Family History  Adopted: Yes  Problem Relation Age of Onset   Colon cancer Neg Hx    Esophageal cancer Neg Hx    Rectal cancer Neg Hx    Stomach cancer Neg Hx    Colon polyps Neg Hx    Social History  reports that he quit smoking about 3 years ago. His smoking use included cigarettes. He started smoking about 33 years ago. He has a 16 pack-year smoking history. He has never used smokeless tobacco. He reports that he does not drink alcohol and does not use drugs. Allergies Allergies[1] Home medications Prior to Admission medications  Medication Sig Start Date End Date Taking? Authorizing Provider  acetaminophen  (TYLENOL ) 500 MG tablet Take 1,000 mg by mouth 2 (two) times daily.   Yes [provider]  albuterol  (PROVENTIL ) (2.5 MG/3ML) 0.083% nebulizer solution Take 3 mLs (2.5 mg total) by nebulization every 4 (four) hours as needed for wheezing or shortness of breath. 06/25/23  Yes Elgergawy, Brayton RAMAN, MD  aspirin  EC 81 MG tablet Take 81 mg by mouth daily at 6 PM.   Yes [provider]  diphenhydrAMINE  (BENADRYL ) 25 MG  tablet Take 50-75 mg by mouth 2 (two) times daily.   Yes [provider]  DM-Doxylamine-Acetaminophen  (NYQUIL HBP COLD & FLU PO) Take 30 mLs by mouth every 12 (twelve) hours as needed (cough).   Yes [provider]  doxazosin  (CARDURA ) 4 MG tablet Take 4 mg by mouth daily.   Yes [provider]  hydrALAZINE  (APRESOLINE ) 100 MG tablet Take 1 tablet (100 mg total) by mouth 3 (three) times daily. Patient taking differently: Take 100 mg by mouth 2 (two) times daily. 12/18/23 02/10/24 Yes Pahwani, Ravi, MD  multivitamin (RENA-VIT) TABS tablet Take 1 tablet by mouth daily. 01/29/23  Yes Ngetich, Dinah C, NP  oxyCODONE  (ROXICODONE ) 5 MG immediate release tablet Take 1 tablet (5 mg total) by mouth every 4 (four) hours as needed for severe pain (pain score 7-10). 11/15/23  Yes Freddi Hamilton, MD  sevelamer  carbonate (RENVELA ) 2.4 g PACK Take 4.8 g by mouth 3 (three) times daily with meals. Also takes with his snacks   Yes [provider]  doxazosin  (CARDURA ) 8 MG tablet Take 1 tablet (8 mg total) by mouth at bedtime. Patient not taking: Reported on 01/30/2024 12/18/23   Vernon Ranks, MD  metoprolol  succinate (TOPROL -XL) 25 MG 24 hr tablet Take 1.5 tablets (37.5 mg total) by mouth daily. Patient not taking: Reported on 02/10/2024 12/18/23 02/10/24  Vernon Ranks, MD  OXYGEN  Inhale 3 L/min into the lungs continuous.    [provider]  UNABLE TO FIND Inhale 1 Device into the lungs at bedtime. CPAP    [provider]     Vitals:   02/11/24 2309 02/12/24 0409 02/12/24 0500 02/12/24 0747  BP: (!) 163/64 (!) 170/69  (!) 166/61  Pulse: 72 76  72  Resp: 20 18  19   Temp: 98.4 F (36.9 C) 97.7 F (36.5 C)    TempSrc: Oral Oral  Oral  SpO2: 100% 96%  99%  Weight:   80 kg   Height:       Exam Gen alert, no distress, on [place] O2 at 2-3 Lpm Sclera anicteric, throat clear  No jvd or bruits Chest coarse rhonchi bilaterally, no wheezing RRR no MRG Abd soft ntnd  no mass or ascites +bs Ext trace LE edema,  no other edema Neuro is alert, Ox 3 , nf    L AVF+bruit   Relevant home meds: Doxazosin  Hydralazine  Home O2 Toprol  XL   OP HD: [facility] MWF  4h B400  80.7kg   AVF Heparin  2500  Last OP HD 1/19, post wt 80.6 Comes off 1-2kg over usually Dry wt raised 2 wks ago Wt gains ~ 1.5-3 kg  Hep B SAg neg on 01/08/24   Assessment/ Plan:  # ESRD - on HD MWF, has not missed - plan HD today sooner than later   # HTN/ BP - bp's are wnl or somewhat high - hold bp lowering meds til after HD   # Volume - no grossly overloaded, but CXR + vasc congestion - max UF w/ HD today   # Anemia of esrd - Hb 8-10 range, follow  # acute on chronic hypoxic resp failure/ metapneumovirus infection/ volume overload - resp panel +for MPV - uses 3-4 L [place] O2 at home intermittently  - here is on continuous 3 L [place] - CXR borderline vasc congestion - CT showed poss PNA - getting IV abx per pmd - plan HD early 2nd shift upstairs, max UF for volume removal       Myer Fret  MD [facility] 02/12/2024, 8:17 AM  Recent Labs  Lab 02/10/24 1523 02/11/24 0401 02/12/24 0302  HGB 9.0* 8.8* 8.7*  ALBUMIN  4.1 3.9  --   CALCIUM  8.7* 8.4* 8.9  CREATININE 5.15* 6.97* 9.60*  K 3.5 4.1 4.2   Inpatient medications:  aspirin  EC  81 mg Oral q1800   dextromethorphan -guaiFENesin   1 tablet Oral BID   doxazosin   4 mg Oral Daily   heparin   5,000 Units Subcutaneous Q8H   hydrALAZINE   100 mg Oral BID   insulin  aspart  0-6 Units Subcutaneous TID WC   metoprolol  succinate  25 mg Oral Daily   multivitamin  1 tablet Oral Daily   sodium chloride  flush  3 mL Intravenous Q12H    cefTRIAXone  (ROCEPHIN )  IV 2 g (02/11/24 2341)   doxycycline  (VIBRAMYCIN ) IV 100 mg (02/11/24 2255)   acetaminophen  **OR** acetaminophen , albuterol , polyethylene glycol      [1]  Allergies Allergen Reactions   Codeine Nausea And Vomiting   Lortab [Hydrocodone -Acetaminophen ] Nausea And Vomiting

## 2024-02-12 NOTE — Progress Notes (Signed)
 Heart Failure Navigator Progress Note  Assessed for Heart & Vascular TOC clinic readiness.  Patient does not meet criteria due to ESRD on HD.   Navigator available for reassessment of patient.   Duwaine Plant, PharmD, BCPS Heart Failure Stewardship Pharmacist Phone [phone]

## 2024-02-12 NOTE — Progress Notes (Signed)
 Received patient in bed to unit.  Alert and oriented.  Informed consent signed and in chart.   TX duration:3.5  Patient tolerated well.  Transported back to the room  Alert, without acute distress.  Hand-off given to patient's nurse.   Access used: AVF Access issues: NA  Total UF removed: 4L Medication(s) given: NA Post HD weight: 78.2KG   02/12/24 1749  Vitals  Temp 98.6 F (37 C)  BP (!) 169/64  MAP (mmHg) 97  Pulse Rate 76  ECG Heart Rate 82  Resp (!) 22  Weight 78.2 kg  Type of Weight Post-Dialysis  Oxygen  Therapy  SpO2 100 %  O2 Device Room Air  During Treatment Monitoring  Blood Flow Rate (mL/min) 199 mL/min  Arterial Pressure (mmHg) -111.71 mmHg  Venous Pressure (mmHg) 80.6 mmHg  TMP (mmHg) 40.4 mmHg  Ultrafiltration Rate (mL/min) 1851 mL/min  Dialysate Flow Rate (mL/min) 300 ml/min  Dialysate Potassium Concentration 3  Dialysate Calcium  Concentration 2.5  Duration of HD Treatment -hour(s) 3.5 hour(s)  Cumulative Fluid Removed (mL) per Treatment  4000.21  HD Safety Checks Performed Yes  Intra-Hemodialysis Comments Tx completed  Post Treatment  Dialyzer Clearance Clear  Liters Processed 74.8  Fluid Removed (mL) 4000 mL  Tolerated HD Treatment Yes  AVG/AVF Arterial Site Held (minutes) 7 minutes  AVG/AVF Venous Site Held (minutes) 7 minutes  Fistula / Graft Left Forearm Arteriovenous fistula  No placement date or time found.   Placed prior to admission: Yes  Orientation: Left  Access Location: Forearm  Access Type: Arteriovenous fistula  Site Condition No complications  Fistula / Graft Assessment Present;Bruit;Thrill  Status Deaccessed  Needle Size 15  Drainage Description None      [name]

## 2024-02-12 NOTE — Assessment & Plan Note (Addendum)
 Hyperglycemia  Patient was placed on insulin  sliding scale for  glucose cover and monitoring during his hospitalization  At the time of discharge his capillary glucose was 114 and 143 mg/dl

## 2024-02-12 NOTE — Progress Notes (Signed)
 " Progress Note   Patient: Kirk [name] FMW:[id number] DOB: 1960/02/13 DOA: 02/10/2024     2 DOS: the patient was seen and examined on 02/12/2024   Brief hospital course: 64 y.o. male with medical history significant of hypertension, hyperlipidemia, diabetes, paroxysmal atrial fibrillation, CAD, chronic combined systolic and diastolic CHF, chronic respiratory failure with hypoxia, PTSD, depression, ESRD on HD, COPD/asthma, OSA, OHS, obesity, seizures, subdural hematoma, pancytopenia presenting with shortness of breath and cough.  CT of the chest abdomen pelvis showed motion degraded exam with chronic nodularity as well as new consolidation on top of this chronic nodularity suspicious for infection/pneumonia. Also noted was likely reactive lymphadenopathy. No acute abnormality of the abdomen pelvis but did demonstrate stable 2.6 mm fluid density in the soft tissues of the gluteal cleft. Finding consistent with failed renal transplant also noted.  Nephrology consulted for management of hemodialysis. He tested positive for metapneumovirus. Admitted for management of metapneumovirus, viral illness as well as acute on chronic congestive heart failure.  01/21 placed on bronchodilators and steroids for COPD exacerbation   Assessment and Plan: * COPD with acute exacerbation (HCC) Patient with chronic changes, at the lower zones, rule out pneumonia.  Positive meta pneumovirus   Plan to add systemic and inhaled corticosteroids Bronchodilator therapy Airway clearing techniques with flutter valve and incentive spirometry Antitussive  agents.   Acute on chronic systolic CHF (congestive heart failure) (HCC) Echocardiogram with reduced LV systolic function with EF 25 to 30%, global hypokinesis, dilatated LV cavity, fusion of EA, RV systolic function low normal, LA with mild dilatation, moderate aortic stenosis   Mild hypervolemia to my examination today Systolic blood pressure 160 to 170 mmHg   Plan for  HD with ultrafiltration today  Continue afterload reduction with hydralazine  and add isosorbide   Continue metoprolol   Essential hypertension Continue blood pressure control with hydralazine  and add isosorbide   Continue metoprolol .   CAD (coronary artery disease) No chest pain, no acute coronary syndrome   PAF (paroxysmal atrial fibrillation) (HCC) Continue metoprolol  for rate control, patient is not on DOAC. Continue aspirin    ESRD on dialysis (HCC) Hyponatremia.  Today pre HD BUN is 42 with K at 4.2 and serum bicarbonate at 26  Plan for HD today   Anemia of chronic renal disease.  Leukopenia, and thrombocytopenia, possible due to acute viral illness.  Follow up cell count in am.   Type 2 diabetes mellitus with diabetic nephropathy, without long-term current use of insulin  (HCC) Continue glucose cover and monitoring with insulin  sliding scale   OSA on CPAP Continue Cpap         Subjective: Patient with no chest pain, dyspnea has been improving but not yet back to baseline, currently using Cpap   Physical Exam: Vitals:   02/12/24 0500 02/12/24 0747 02/12/24 1140 02/12/24 1215  BP:  (!) 166/61 (!) 178/66   Pulse:  72 71 71  Resp:  19 20 20   Temp:   98.3 F (36.8 C)   TempSrc:  Oral Oral   SpO2:  99% 100% 98%  Weight: 80 kg     Height:       Neurology awake and alert, deconditioned ENT with mild pallor Cardiovascular with S1 and S2 present and regular with no gallops, rubs, positive systolic murmur at the base  Respiratory with positive rhonchi and rales, scattered wheezing Abdomen with no distention, soft and non tender Trace lower extremity edema   Data Reviewed:    Family Communication: no family at the bedside  Disposition: Status is: Inpatient Remains inpatient appropriate because: respiratory care and HD   Planned Discharge Destination: Home     Author: Elidia Toribio Furnace, MD 02/12/2024 12:38 PM  For on call review [email or web address].  "

## 2024-02-12 NOTE — Assessment & Plan Note (Addendum)
 Patient with chronic changes, at the lower zones, ruled out pneumonia.  Positive meta pneumovirus   Patient was placed on systemic and inhaled corticosteroids Bronchodilator therapy Airway clearing techniques with flutter valve and incentive spirometry Antitussive  agents.   His symptoms have improved, patient will continue steroids with oral prednisone  for 3 more days Continue bronchodilator therapy and airway clearing techniques. Continue home 02 supplementation per his usual regimen

## 2024-02-12 NOTE — Hospital Course (Addendum)
 Kirk [name] was admitted to the hospital with the working diagnosis of COPD exacerbation.   64 y.o. male with medical history significant of hypertension, hyperlipidemia, type 2 diabetes mellitus, paroxysmal atrial fibrillation, CAD, heart failure,  PTSD, depression, ESRD on HD, COPD/asthma, OSA, OHS, seizures, and history of subdural hematoma who presented with dyspnea and cough.  Reported about 10 days of worsening cough and dyspnea. On the day of hospitalization he was noted to be hypoxemic 89% on room and having fever 102.4 on hemodialysis, he had a partial HD treatment and was referred to the ED.  On his initial physical examination his blood pressure was 128/76, HR 82, RR 20 and 02 saturation 98% on supplemental 02, temperature 101.8  Lungs with with rhonchi and rales, positive wheezing, heart with S1 and S2 present and regular, abdomen with no distention and no lower extremity edema    Na 138, K 3.5 Cl 93 bicarbonate 32 glucose 114 BUN 17 cr 5.15  Mg 1,9  BNP >35,000 Lactic acid 0,9 and 0,5  Procalcitonin 1.14  Wbc 2,8 hgb 9,0 plt 71   Influenza negative RSV negative SARS covid 19 negative   Positive for metapneumovirus.  Chest radiograph with cardiomegaly and bilateral hilar vascular congestion, bilateral lower lobes interstitial infiltrates.  Ct chest lower lobes bilateral emphysematous changes with sub pleural fibrosis and traction bronchiectasis.  Increased peri bronchial nodularity right middle lobe.  No CT evidence of acute intra abdominal or pelvic abnormality.   EKG 77 bpm, normal axis, normal intervals, qtc 494 sinus rhythm with no significant ST segment or T wave changes.   01/21 placed on bronchodilators and steroids for COPD exacerbation  HD with good toleration. 01/22 clinically improving.  01/23 patient will continue bronchodilator and steroids at home, follow up with primary care as outpatient.

## 2024-02-12 NOTE — Procedures (Signed)
 S: tolerating HD well, improving oxygenation w/ fluid removal   Vitals:   02/12/24 1515 02/12/24 1530 02/12/24 1630 02/12/24 1702  BP: (!) 184/72 (!) 192/73 (!) 194/73 (!) 182/68  Pulse: 74 86 77 76  Resp: (!) 25 (!) 27 (!) 28 (!) 29  Temp:      TempSrc:      SpO2: 100% 100% 100% 100%  Weight:      Height:        Recent Labs  Lab 02/10/24 1523 02/11/24 0401 02/12/24 0302  HGB 9.0* 8.8* 8.7*  ALBUMIN  4.1 3.9  --   CALCIUM  8.7* 8.4* 8.9  CREATININE 5.15* 6.97* 9.60*  K 3.5 4.1 4.2    Inpatient medications:  aspirin  EC  81 mg Oral q1800   budesonide  (PULMICORT ) nebulizer solution  0.25 mg Nebulization BID   Chlorhexidine  Gluconate Cloth  6 each Topical Q0600   dextromethorphan -guaiFENesin   1 tablet Oral BID   doxazosin   4 mg Oral Daily   heparin   5,000 Units Subcutaneous Q8H   hydrALAZINE   100 mg Oral BID   insulin  aspart  0-6 Units Subcutaneous TID WC   ipratropium-albuterol   3 mL Nebulization Q6H   isosorbide  mononitrate  15 mg Oral Daily   methylPREDNISolone  (SOLU-MEDROL ) injection  40 mg Intravenous Q12H   metoprolol  succinate  25 mg Oral Daily   multivitamin  1 tablet Oral Daily   sodium chloride  flush  3 mL Intravenous Q12H    anticoagulant sodium citrate      acetaminophen  **OR** acetaminophen , albuterol , alteplase , anticoagulant sodium citrate , feeding supplement (NEPRO CARB STEADY), heparin , heparin , lidocaine  (PF), lidocaine -prilocaine , pentafluoroprop-tetrafluoroeth, polyethylene glycol  I was present at the procedure, reviewed the HD regimen and made appropriate changes.   Myer Fret MD  [facility] 02/12/2024, 5:06 PM

## 2024-02-12 NOTE — Assessment & Plan Note (Addendum)
 Hyponatremia.   01/23 Pre HD BUN is 47, with K at 4,2 P 3,9   Anemia of chronic renal disease.  Leukopenia, and thrombocytopenia, possible due to acute viral illness.  At the time of discharge cell count improving with wbc 3,9, and plt at 93, with hgb at 10.5   Metabolic bone disease, continue with P binders.  Continue outpatient HD

## 2024-02-12 NOTE — Assessment & Plan Note (Addendum)
 Echocardiogram with reduced LV systolic function with EF 25 to 30%, global hypokinesis, dilatated LV cavity, fusion of EA, RV systolic function low normal, LA with mild dilatation, moderate aortic stenosis   Systolic blood pressure 150 mmHg   Continue afterload reduction with hydralazine  and isosorbide   Continue metoprolol  Renal replacement therapy with ultrafiltration

## 2024-02-12 NOTE — Assessment & Plan Note (Signed)
 Continue blood pressure control with hydralazine  and isosorbide   Continue metoprolol .

## 2024-02-13 DIAGNOSIS — N186 End stage renal disease: Secondary | ICD-10-CM | POA: Diagnosis not present

## 2024-02-13 DIAGNOSIS — Z992 Dependence on renal dialysis: Secondary | ICD-10-CM | POA: Diagnosis not present

## 2024-02-13 DIAGNOSIS — J441 Chronic obstructive pulmonary disease with (acute) exacerbation: Secondary | ICD-10-CM | POA: Diagnosis not present

## 2024-02-13 DIAGNOSIS — G4733 Obstructive sleep apnea (adult) (pediatric): Secondary | ICD-10-CM | POA: Diagnosis not present

## 2024-02-13 DIAGNOSIS — I2510 Atherosclerotic heart disease of native coronary artery without angina pectoris: Secondary | ICD-10-CM | POA: Diagnosis not present

## 2024-02-13 DIAGNOSIS — I10 Essential (primary) hypertension: Secondary | ICD-10-CM | POA: Diagnosis not present

## 2024-02-13 DIAGNOSIS — I5023 Acute on chronic systolic (congestive) heart failure: Secondary | ICD-10-CM | POA: Diagnosis not present

## 2024-02-13 DIAGNOSIS — I480 Paroxysmal atrial fibrillation: Secondary | ICD-10-CM | POA: Diagnosis not present

## 2024-02-13 DIAGNOSIS — E1121 Type 2 diabetes mellitus with diabetic nephropathy: Secondary | ICD-10-CM | POA: Diagnosis not present

## 2024-02-13 LAB — RENAL FUNCTION PANEL
Albumin: 3.9 g/dL (ref 3.5–5.0)
Anion gap: 14 (ref 5–15)
BUN: 29 mg/dL — ABNORMAL HIGH (ref 8–23)
CO2: 28 mmol/L (ref 22–32)
Calcium: 10.3 mg/dL (ref 8.9–10.3)
Chloride: 95 mmol/L — ABNORMAL LOW (ref 98–111)
Creatinine, Ser: 6.88 mg/dL — ABNORMAL HIGH (ref 0.61–1.24)
GFR, Estimated: 8 mL/min — ABNORMAL LOW
Glucose, Bld: 183 mg/dL — ABNORMAL HIGH (ref 70–99)
Phosphorus: 3.6 mg/dL (ref 2.5–4.6)
Potassium: 4.2 mmol/L (ref 3.5–5.1)
Sodium: 137 mmol/L (ref 135–145)

## 2024-02-13 LAB — CBC
HCT: 27.6 % — ABNORMAL LOW (ref 39.0–52.0)
Hemoglobin: 9.2 g/dL — ABNORMAL LOW (ref 13.0–17.0)
MCH: 32.9 pg (ref 26.0–34.0)
MCHC: 33.3 g/dL (ref 30.0–36.0)
MCV: 98.6 fL (ref 80.0–100.0)
Platelets: 80 K/uL — ABNORMAL LOW (ref 150–400)
RBC: 2.8 MIL/uL — ABNORMAL LOW (ref 4.22–5.81)
RDW: 14.2 % (ref 11.5–15.5)
WBC: 2 K/uL — ABNORMAL LOW (ref 4.0–10.5)
nRBC: 0 % (ref 0.0–0.2)

## 2024-02-13 LAB — HEPATITIS B SURFACE ANTIBODY, QUANTITATIVE: Hep B S AB Quant (Post): 11.7 m[IU]/mL

## 2024-02-13 LAB — GLUCOSE, CAPILLARY
Glucose-Capillary: 200 mg/dL — ABNORMAL HIGH (ref 70–99)
Glucose-Capillary: 126 mg/dL — ABNORMAL HIGH (ref 70–99)
Glucose-Capillary: 117 mg/dL — ABNORMAL HIGH (ref 70–99)
Glucose-Capillary: 148 mg/dL — ABNORMAL HIGH (ref 70–99)

## 2024-02-13 MED ORDER — TRAZODONE HCL 50 MG PO TABS
50.0000 mg | ORAL_TABLET | Freq: Every evening | ORAL | Status: DC | PRN
  Administered 2024-02-13 – 2024-02-14 (×2): 50 mg via ORAL
  Filled 2024-02-13 (×2): qty 1

## 2024-02-13 MED ORDER — CHLORHEXIDINE GLUCONATE CLOTH 2 % EX PADS: 6.0000 | MEDICATED_PAD | Freq: Every day | CUTANEOUS | Status: DC

## 2024-02-13 MED ORDER — OXYCODONE HCL 5 MG PO TABS
5.0000 mg | ORAL_TABLET | ORAL | Status: DC | PRN
  Administered 2024-02-13 – 2024-02-14 (×3): 5 mg via ORAL
  Filled 2024-02-13 (×3): qty 1

## 2024-02-13 MED ORDER — SEVELAMER CARBONATE 2.4 G PO PACK
4.8000 g | PACK | Freq: Three times a day (TID) | ORAL | Status: DC
  Administered 2024-02-14 – 2024-02-15 (×4): 4.8 g via ORAL
  Filled 2024-02-13 (×6): qty 2

## 2024-02-13 MED ORDER — IPRATROPIUM-ALBUTEROL 0.5-2.5 (3) MG/3ML IN SOLN
3.0000 mL | Freq: Two times a day (BID) | RESPIRATORY_TRACT | Status: DC
  Administered 2024-02-13 – 2024-02-15 (×4): 3 mL via RESPIRATORY_TRACT
  Filled 2024-02-13 (×4): qty 3

## 2024-02-13 MED ORDER — GUAIFENESIN-DM 100-10 MG/5ML PO SYRP
5.0000 mL | ORAL_SOLUTION | Freq: Four times a day (QID) | ORAL | Status: DC
  Administered 2024-02-13 – 2024-02-15 (×5): 5 mL via ORAL
  Filled 2024-02-13 (×6): qty 5

## 2024-02-13 NOTE — Progress Notes (Addendum)
 " Progress Note   Patient: Kirk [name] FMW:[id number] DOB: 1960/12/05 DOA: 02/10/2024     3 DOS: the patient was seen and examined on 02/13/2024   Brief hospital course: 64 y.o. male with medical history significant of hypertension, hyperlipidemia, diabetes, paroxysmal atrial fibrillation, CAD, chronic combined systolic and diastolic CHF, chronic respiratory failure with hypoxia, PTSD, depression, ESRD on HD, COPD/asthma, OSA, OHS, obesity, seizures, subdural hematoma, pancytopenia presenting with shortness of breath and cough.  CT of the chest abdomen pelvis showed motion degraded exam with chronic nodularity as well as new consolidation on top of this chronic nodularity suspicious for infection/pneumonia. Also noted was likely reactive lymphadenopathy. No acute abnormality of the abdomen pelvis but did demonstrate stable 2.6 mm fluid density in the soft tissues of the gluteal cleft. Finding consistent with failed renal transplant also noted.  Nephrology consulted for management of hemodialysis. He tested positive for metapneumovirus. Admitted for management of metapneumovirus, viral illness as well as acute on chronic congestive heart failure.  01/21 placed on bronchodilators and steroids for COPD exacerbation  HD with good toleration.  Assessment and Plan: * COPD with acute exacerbation (HCC) Patient with chronic changes, at the lower zones, rule out pneumonia.  Positive meta pneumovirus   Plan to add systemic and inhaled corticosteroids Bronchodilator therapy Airway clearing techniques with flutter valve and incentive spirometry Antitussive  agents, change to scheduled Resume oxycodone  for pain control   Acute on chronic systolic CHF (congestive heart failure) (HCC) Echocardiogram with reduced LV systolic function with EF 25 to 30%, global hypokinesis, dilatated LV cavity, fusion of EA, RV systolic function low normal, LA with mild dilatation, moderate aortic stenosis   Systolic blood  pressure 160 to 170 mmHg   Continue afterload reduction with hydralazine  and add isosorbide   Continue metoprolol  Renal replacement therapy with ultrafiltration   Essential hypertension Continue blood pressure control with hydralazine  and isosorbide   Continue metoprolol .   CAD (coronary artery disease) No chest pain, no acute coronary syndrome   PAF (paroxysmal atrial fibrillation) (HCC) Continue metoprolol  for rate control, patient is not on DOAC. Continue aspirin    ESRD on dialysis (HCC) Hyponatremia.   Today BUN is 29, with K at 4,2 P 3,6   Anemia of chronic renal disease.  Leukopenia, and thrombocytopenia, possible due to acute viral illness.  Count with improving wbc and plt   Type 2 diabetes mellitus with diabetic nephropathy, without long-term current use of insulin  (HCC) Hyperglycemia  Continue glucose cover and monitoring with insulin  sliding scale   OSA on CPAP Continue Cpap    Subjective: Patient with no chest pain, dyspnea has been improving, continue to have cough and abdominal pain when coughing   Physical Exam: Vitals:   02/13/24 0345 02/13/24 0757 02/13/24 0907 02/13/24 1055  BP: (!) 158/62 (!) 159/55  (!) 177/55  Pulse: 74  74 78  Resp: 19 20 18 16   Temp: 98 F (36.7 C) 97.6 F (36.4 C)  97.9 F (36.6 C)  TempSrc: Oral Oral  Oral  SpO2: 96%  95% 92%  Weight:      Height:       Neurology awake and alert, deconditioned ENT with no pallor or icterus Cardiovascular with S1 and S2 present and regular with no gallops or rubs Respiratory with bilateral wheezing and rhonchi with scattered rales Abdomen soft, tender to palpation at the left rectus muscle No lower extremity edema   Data Reviewed:    Family Communication: no family at the bedside   Disposition: Status  is: Inpatient Remains inpatient appropriate because: recovering COPD   Planned Discharge Destination: Home    Author: Elidia Toribio Furnace, MD 02/13/2024 3:01 PM  For on  call review [email or web address].  "

## 2024-02-13 NOTE — TOC Initial Note (Signed)
 Transition of Care [facility]) - Initial/Assessment Note    Patient Details  Name: Kirk [name] MRN: [id number] Date of Birth: 13-Mar-1960  Transition of Care [facility] CM/SW Contact:    Waddell Barnie Rama, RN Phone Number: 02/13/2024, 4:27 PM  Clinical Narrative:                 From home with spouse, has PCP and insurance on file, states has no HH services in place at this time , has cpap machine and home oxygen  with [employer] 4 liters at home.  States family member  (wife) will transport them home at [employer] and family is support system, states [facility] [address].  Pta self ambulatory.   There are no ICM needs identified  at this time.  Please place consult for ICM needs.    Expected Discharge Plan: Home/Self Care Barriers to Discharge: Continued Medical Work up   Patient Goals and CMS Choice Patient states their goals for this hospitalization and ongoing recovery are:: return home   Choice offered to / list presented to : NA      Expected Discharge Plan and Services In-house Referral: NA Discharge Planning Services: CM Consult Post Acute Care Choice: NA Living arrangements for the past 2 months: Single Family Home                 DME Arranged: N/A DME Agency: NA       HH Arranged: NA          Prior Living Arrangements/Services Living arrangements for the past 2 months: Single Family Home Lives with:: Spouse Patient language and need for interpreter reviewed:: Yes Do you feel safe going back to the place where you live?: Yes      Need for Family Participation in Patient Care: Yes (Comment) Care giver support system in place?: Yes (comment) Current home services: DME (home oxygen  with [employer] 4 liters, cpap) Criminal Activity/Legal Involvement Pertinent to Current Situation/Hospitalization: No - Comment as needed  Activities of Daily Living   ADL Screening (condition at time of admission) Independently performs ADLs?: Yes (appropriate for  developmental age) Is the patient deaf or have difficulty hearing?: No Does the patient have difficulty seeing, even when wearing glasses/contacts?: No Does the patient have difficulty concentrating, remembering, or making decisions?: No  Permission Sought/Granted Permission sought to share information with : Case Manager Permission granted to share information with : Yes, Verbal Permission Granted              Emotional Assessment Appearance:: Appears stated age Attitude/Demeanor/Rapport: Engaged Affect (typically observed): Appropriate Orientation: : Oriented to Self, Oriented to Place, Oriented to  Time, Oriented to Situation Alcohol / Substance Use: Not Applicable Psych Involvement: No (comment)  Admission diagnosis:  Sepsis due to pneumonia (HCC) [J18.9, A41.9] Pneumonia due to infectious organism, unspecified laterality, unspecified part of lung [J18.9] Acute on chronic congestive heart failure, unspecified heart failure type (HCC) [I50.9] Sepsis without acute organ dysfunction, due to unspecified organism [facility]) [A41.9] Patient Active Problem List   Diagnosis Date Noted   COPD with acute exacerbation (HCC) 02/12/2024   CAD (coronary artery disease) 02/10/2024   Sepsis due to pneumonia (HCC) 02/10/2024   Streptococcal bacteremia 12/12/2023   NSTEMI (non-ST elevated myocardial infarction) (HCC) 12/10/2023   Nonrheumatic aortic valve stenosis 10/06/2023   Precordial chest pain 10/05/2023   Nonischemic cardiomyopathy (HCC) 10/05/2023   Acute febrile illness 10/05/2023   Accelerated hypertension with diastolic congestive heart failure, NYHA class 3 (HCC)  10/04/2023   Melena 06/20/2023   Respiratory distress 06/19/2023   PAF (paroxysmal atrial fibrillation) (HCC) 04/02/2023   Orthostatic hypotension 03/20/2023   Dysphagia 03/20/2023   COPD (chronic obstructive pulmonary disease) (HCC)    Neutropenia 03/18/2023   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) 03/17/2023   Stage IV  carcinoma of breast (HCC) 03/17/2023   Chronic combined systolic (congestive) and diastolic (congestive) heart failure (HCC) 03/16/2023   Acute on chronic systolic CHF (congestive heart failure) (HCC) 01/09/2023   Rhinitis 09/18/2022   PTSD (post-traumatic stress disorder) 09/12/2022   Aortic atherosclerosis 05/25/2022   Cerebral aneurysm 11/30/2021   Subdural hematoma (HCC) 11/30/2021   Traumatic subdural hematoma (SDH) (HCC) 11/21/2021   SDH (subdural hematoma) (HCC) 11/08/2021   Seizure (HCC) 11/08/2021   Chronic left shoulder pain 09/21/2021   Chronic right shoulder pain 09/21/2021   Cough with hemoptysis 08/01/2021   Preop respiratory exam 03/02/2021   Intractable nausea and vomiting 01/09/2021   Abnormal CT of the chest 05/24/2020   Rotator cuff syndrome of right shoulder 11/17/2019   Right knee pain 06/02/2018   Right tibial fracture 06/02/2018   MDD (major depressive disorder), recurrent episode 05/30/2017   Hypokalemia    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   Cardiogenic pulmonary edema (HCC) 09/13/2016   Shunt malfunction 07/03/2016   Need for hepatitis C screening test 06/29/2015   Myalgia 05/17/2015   Secondary central sleep apnea 12/09/2014   Obesity hypoventilation syndrome (HCC) 12/09/2014   Narcotic drug use 12/09/2014   Hypersomnia with sleep apnea 12/09/2014   SCC (squamous cell carcinoma), arm 11/09/2014   Mild persistent chronic asthma without complication 09/18/2014   ESRD on dialysis (HCC) 09/09/2014   Essential hypertension 09/09/2014   HLD (hyperlipidemia) 09/09/2014   Anemia of chronic disease 06/23/2014   Pancytopenia (HCC) 04/28/2014   Thrombocytopenia 04/10/2014   Fungal dermatitis 03/17/2014   S/p nephrectomy 09/17/2013   Tension headache    Memory loss    Edema    Thoracic or lumbosacral neuritis or radiculitis 03/27/2012   Nerve root pain 03/27/2012   History of kidney transplant 09/10/2011   Type 2 diabetes mellitus with diabetic nephropathy,  without long-term current use of insulin  (HCC) 08/30/2011   Hearing loss 08/16/2011   Obesity 08/16/2011   OSA on CPAP 08/05/2011   GIB (gastrointestinal bleeding) 10/28/2007   PCP:  Leonarda Roxan [name], NP Pharmacy:   [id number] [address], [place] [address] - [address] AT [facility] [address] [place] [address] Phone: [phone] Fax: [phone]     Social Drivers of Health (SDOH) Social History: SDOH Screenings   Food Insecurity: Food Insecurity Present (02/11/2024)  Housing: Low Risk (02/11/2024)  Transportation Needs: Unmet Transportation Needs (02/11/2024)  Utilities: At Risk (02/11/2024)  Depression (PHQ2-9): Low Risk (01/29/2023)  Financial Resource Strain: Medium Risk (11/02/2021)  Social Connections: Socially Isolated (12/11/2023)  Tobacco Use: Medium Risk (01/30/2024)   SDOH Interventions:     Readmission Risk Interventions    02/13/2024    4:22 PM 03/18/2023    5:03 PM 01/11/2023    5:26 PM  Readmission Risk Prevention Plan  Transportation Screening Complete Complete Complete  Medication Review Oceanographer) Complete Complete Complete  PCP or Specialist appointment within 3-5 days of discharge Complete Complete Complete  HRI or Home Care Consult Complete Complete Complete  Palliative Care Screening Not Applicable Not Applicable Not Applicable  Skilled Nursing Facility Not Applicable Not Applicable Not Applicable

## 2024-02-13 NOTE — Plan of Care (Signed)
   Problem: Fluid Volume: Goal: Hemodynamic stability will improve Outcome: Progressing   Problem: Clinical Measurements: Goal: Diagnostic test results will improve Outcome: Progressing Goal: Signs and symptoms of infection will decrease Outcome: Progressing

## 2024-02-13 NOTE — Progress Notes (Signed)
 Patient wishes to discuss with nephrology as to continuation of binders and with attending as to a PRN sleep aid.

## 2024-02-13 NOTE — Progress Notes (Signed)
 " [facility] Progress Note   Subjective:    Seen and examined patient at bedside. Tolerated yesterday's HD with net UF 4L. Not in acute respiratory distress and on O2. Next HD 1/23.  Objective Vitals:   02/13/24 0345 02/13/24 0757 02/13/24 0907 02/13/24 1055  BP: (!) 158/62 (!) 159/55  (!) 177/55  Pulse: 74  74 78  Resp: 19 20 18 16   Temp: 98 F (36.7 C) 97.6 F (36.4 C)  97.9 F (36.6 C)  TempSrc: Oral Oral  Oral  SpO2: 96%  95% 92%  Weight:      Height:       Physical Exam Gen alert, no distress, on [place] O2 at 2-3 Lpm Sclera anicteric Chest coarse rhonchi bilaterally, expiratory wheezing RRR no MRG Abd soft ntnd no mass or ascites +bs Ext no LE edema Neuro is alert, Ox 3  L AVF+bruit   Filed Weights   02/12/24 1352 02/12/24 1749 02/13/24 0159  Weight: 81.1 kg 78.2 kg 79.3 kg    Intake/Output Summary (Last 24 hours) at 02/13/2024 1542 Last data filed at 02/13/2024 1300 Gross per 24 hour  Intake 603 ml  Output 4000 ml  Net -3397 ml    Additional Objective Labs: Basic Metabolic Panel: Recent Labs  Lab 02/11/24 0401 02/12/24 0302 02/13/24 0215  NA 133* 134* 137  K 4.1 4.2 4.2  CL 90* 90* 95*  CO2 27 26 28   GLUCOSE 118* 102* 183*  BUN 26* 42* 29*  CREATININE 6.97* 9.60* 6.88*  CALCIUM  8.4* 8.9 10.3  PHOS  --   --  3.6   Liver Function Tests: Recent Labs  Lab 02/10/24 1523 02/11/24 0401 02/13/24 0215  AST 18 17  --   ALT 10 9  --   ALKPHOS 73 67  --   BILITOT 0.4 0.3  --   PROT 7.2 6.9  --   ALBUMIN  4.1 3.9 3.9   No results for input(s): LIPASE, AMYLASE in the last 168 hours. CBC: Recent Labs  Lab 02/10/24 1523 02/11/24 0401 02/12/24 0302 02/13/24 0215  WBC 2.6* 2.2* 1.7* 2.0*  NEUTROABS 2.1  --  0.8*  --   HGB 9.0* 8.8* 8.7* 9.2*  HCT 27.4* 26.9* 26.5* 27.6*  MCV 99.6 101.9* 100.4* 98.6  PLT 71* 63* 61* 80*   Blood Culture    Component Value Date/Time   SDES BLOOD RIGHT ANTECUBITAL 02/10/2024 1523   SPECREQUEST   02/10/2024 1523    BOTTLES DRAWN AEROBIC AND ANAEROBIC Blood Culture adequate volume   CULT  02/10/2024 1523    NO GROWTH 3 DAYS Performed at [facility], [address] [place], [place] [address]    REPTSTATUS PENDING 02/10/2024 1523    Cardiac Enzymes: No results for input(s): CKTOTAL, CKMB, CKMBINDEX, TROPONINI in the last 168 hours. CBG: Recent Labs  Lab 02/12/24 1142 02/12/24 1849 02/12/24 2134 02/13/24 0607 02/13/24 1103  GLUCAP 114* 137* 161* 200* 126*   Iron Studies: No results for input(s): IRON, TIBC, TRANSFERRIN, FERRITIN in the last 72 hours. Lab Results  Component Value Date   INR 1.1 02/11/2024   INR 1.1 02/10/2024   INR 1.1 12/09/2023   Studies/Results: No results found.  Medications:   aspirin  EC  81 mg Oral q1800   budesonide  (PULMICORT ) nebulizer solution  0.25 mg Nebulization BID   Chlorhexidine  Gluconate Cloth  6 each Topical Q0600   doxazosin   4 mg Oral Daily   guaiFENesin -dextromethorphan   5 mL Oral Q6H  heparin   5,000 Units Subcutaneous Q8H   hydrALAZINE   100 mg Oral BID   insulin  aspart  0-6 Units Subcutaneous TID WC   ipratropium-albuterol   3 mL Nebulization BID   isosorbide  mononitrate  15 mg Oral Daily   methylPREDNISolone  (SOLU-MEDROL ) injection  40 mg Intravenous Q12H   metoprolol  succinate  25 mg Oral Daily   multivitamin  1 tablet Oral Daily   sodium chloride  flush  3 mL Intravenous Q12H    Dialysis Orders: [facility] MWF  4h B400  80.7kg   AVF Heparin  2500  Last OP HD 1/19, post wt 80.6 Comes off 1-2kg over usually Dry wt raised 2 wks ago Wt gains ~ 1.5-3 kg  Hep B SAg neg on 01/08/24  Assessment/Plan: # ESRD - on HD MWF, has not missed - next HD 1/23     # HTN/ BP - bp's are wnl or somewhat high - hold bp lowering meds til after HD     # Volume - no grossly overloaded, but CXR + vasc congestion - max UF as tolerated     # Anemia of esrd - Hb 8-10 range, follow   # acute on chronic  hypoxic resp failure/ metapneumovirus infection/ volume overload - resp panel +for MPV - uses 3-4 L [place] O2 at home intermittently  - here is on continuous 3 L [place] - CXR borderline vasc congestion - CT showed poss PNA - getting IV abx per pmd     Charmaine Piety, NP [facility] 02/13/2024,3:42 PM  LOS: 3 days    "

## 2024-02-14 ENCOUNTER — Encounter (HOSPITAL_COMMUNITY): Payer: Self-pay | Admitting: Internal Medicine

## 2024-02-14 DIAGNOSIS — I2510 Atherosclerotic heart disease of native coronary artery without angina pectoris: Secondary | ICD-10-CM | POA: Diagnosis not present

## 2024-02-14 DIAGNOSIS — I480 Paroxysmal atrial fibrillation: Secondary | ICD-10-CM | POA: Diagnosis not present

## 2024-02-14 DIAGNOSIS — Z992 Dependence on renal dialysis: Secondary | ICD-10-CM | POA: Diagnosis not present

## 2024-02-14 DIAGNOSIS — J441 Chronic obstructive pulmonary disease with (acute) exacerbation: Secondary | ICD-10-CM | POA: Diagnosis not present

## 2024-02-14 DIAGNOSIS — E1121 Type 2 diabetes mellitus with diabetic nephropathy: Secondary | ICD-10-CM | POA: Diagnosis not present

## 2024-02-14 DIAGNOSIS — G4733 Obstructive sleep apnea (adult) (pediatric): Secondary | ICD-10-CM | POA: Diagnosis not present

## 2024-02-14 DIAGNOSIS — I5023 Acute on chronic systolic (congestive) heart failure: Secondary | ICD-10-CM | POA: Diagnosis not present

## 2024-02-14 DIAGNOSIS — I10 Essential (primary) hypertension: Secondary | ICD-10-CM | POA: Diagnosis not present

## 2024-02-14 DIAGNOSIS — N186 End stage renal disease: Secondary | ICD-10-CM | POA: Diagnosis not present

## 2024-02-14 LAB — RENAL FUNCTION PANEL
Albumin: 3.9 g/dL (ref 3.5–5.0)
Anion gap: 18 — ABNORMAL HIGH (ref 5–15)
BUN: 47 mg/dL — ABNORMAL HIGH (ref 8–23)
CO2: 25 mmol/L (ref 22–32)
Calcium: 10.3 mg/dL (ref 8.9–10.3)
Chloride: 92 mmol/L — ABNORMAL LOW (ref 98–111)
Creatinine, Ser: 9.51 mg/dL — ABNORMAL HIGH (ref 0.61–1.24)
GFR, Estimated: 6 mL/min — ABNORMAL LOW
Glucose, Bld: 195 mg/dL — ABNORMAL HIGH (ref 70–99)
Phosphorus: 6 mg/dL — ABNORMAL HIGH (ref 2.5–4.6)
Potassium: 4.2 mmol/L (ref 3.5–5.1)
Sodium: 135 mmol/L (ref 135–145)

## 2024-02-14 LAB — CBC WITH DIFFERENTIAL/PLATELET
Abs Immature Granulocytes: 0.02 K/uL (ref 0.00–0.07)
Basophils Absolute: 0 K/uL (ref 0.0–0.1)
Basophils Relative: 0 %
Eosinophils Absolute: 0 K/uL (ref 0.0–0.5)
Eosinophils Relative: 0 %
HCT: 27.4 % — ABNORMAL LOW (ref 39.0–52.0)
Hemoglobin: 9.1 g/dL — ABNORMAL LOW (ref 13.0–17.0)
Immature Granulocytes: 1 %
Lymphocytes Relative: 13 %
Lymphs Abs: 0.3 K/uL — ABNORMAL LOW (ref 0.7–4.0)
MCH: 32.7 pg (ref 26.0–34.0)
MCHC: 33.2 g/dL (ref 30.0–36.0)
MCV: 98.6 fL (ref 80.0–100.0)
Monocytes Absolute: 0.1 K/uL (ref 0.1–1.0)
Monocytes Relative: 5 %
Neutro Abs: 2 K/uL (ref 1.7–7.7)
Neutrophils Relative %: 81 %
Platelets: 87 K/uL — ABNORMAL LOW (ref 150–400)
RBC: 2.78 MIL/uL — ABNORMAL LOW (ref 4.22–5.81)
RDW: 14.2 % (ref 11.5–15.5)
Smear Review: NORMAL
WBC: 2.5 K/uL — ABNORMAL LOW (ref 4.0–10.5)
nRBC: 0 % (ref 0.0–0.2)

## 2024-02-14 LAB — GLUCOSE, CAPILLARY
Glucose-Capillary: 157 mg/dL — ABNORMAL HIGH (ref 70–99)
Glucose-Capillary: 122 mg/dL — ABNORMAL HIGH (ref 70–99)
Glucose-Capillary: 138 mg/dL — ABNORMAL HIGH (ref 70–99)
Glucose-Capillary: 132 mg/dL — ABNORMAL HIGH (ref 70–99)

## 2024-02-14 MED ORDER — ALUM & MAG HYDROXIDE-SIMETH 200-200-20 MG/5ML PO SUSP
15.0000 mL | Freq: Four times a day (QID) | ORAL | Status: DC | PRN
  Administered 2024-02-14: 15 mL via ORAL
  Filled 2024-02-14: qty 30

## 2024-02-14 MED ORDER — METHYLPREDNISOLONE SODIUM SUCC 125 MG IJ SOLR
40.0000 mg | Freq: Every day | INTRAMUSCULAR | Status: DC
  Administered 2024-02-15: 40 mg via INTRAVENOUS
  Filled 2024-02-14: qty 2

## 2024-02-14 MED ORDER — NITROGLYCERIN 0.4 MG SL SUBL
0.4000 mg | SUBLINGUAL_TABLET | SUBLINGUAL | Status: DC | PRN
  Administered 2024-02-14: 0.4 mg via SUBLINGUAL

## 2024-02-14 MED ORDER — HEPARIN SODIUM (PORCINE) 1000 UNIT/ML IJ SOLN
INTRAMUSCULAR | Status: AC
  Filled 2024-02-14: qty 3

## 2024-02-14 MED ORDER — ISOSORBIDE MONONITRATE ER 30 MG PO TB24
30.0000 mg | ORAL_TABLET | Freq: Every day | ORAL | Status: DC
  Administered 2024-02-15: 30 mg via ORAL
  Filled 2024-02-14: qty 1

## 2024-02-14 MED ORDER — NITROGLYCERIN 0.4 MG SL SUBL
SUBLINGUAL_TABLET | SUBLINGUAL | Status: AC
  Filled 2024-02-14: qty 1

## 2024-02-14 MED ORDER — LIDOCAINE-PRILOCAINE 2.5-2.5 % EX CREA: 1.0000 | TOPICAL_CREAM | CUTANEOUS | Status: DC | PRN

## 2024-02-14 MED ORDER — ALTEPLASE 2 MG IJ SOLR: 2.0000 mg | Freq: Once | INTRAMUSCULAR | Status: DC | PRN

## 2024-02-14 MED ORDER — PENTAFLUOROPROP-TETRAFLUOROETH EX AERO: 1.0000 | INHALATION_SPRAY | CUTANEOUS | Status: DC | PRN

## 2024-02-14 MED ORDER — HEPARIN SODIUM (PORCINE) 1000 UNIT/ML DIALYSIS: 1000.0000 [IU] | INTRAMUSCULAR | Status: DC | PRN

## 2024-02-14 MED ORDER — LIDOCAINE HCL (PF) 1 % IJ SOLN: 5.0000 mL | INTRAMUSCULAR | Status: DC | PRN

## 2024-02-14 MED ORDER — ORAL CARE MOUTH RINSE: 15.0000 mL | OROMUCOSAL | Status: DC | PRN

## 2024-02-14 MED ORDER — HEPARIN SODIUM (PORCINE) 1000 UNIT/ML IJ SOLN
2500.0000 [IU] | Freq: Once | INTRAMUSCULAR | Status: AC
  Administered 2024-02-14: 2500 [IU] via INTRAVENOUS

## 2024-02-14 MED ORDER — HEPARIN SODIUM (PORCINE) 1000 UNIT/ML IJ SOLN: 1000.0000 [IU] | Freq: Once | INTRAMUSCULAR | Status: DC | PRN

## 2024-02-14 NOTE — Progress Notes (Signed)
 TR[facility] night cross cover note:   Prn Maalox/Mylanta added for indigestion.   Eva Pore, DO Hospitalist

## 2024-02-14 NOTE — Progress Notes (Signed)
 CSW reviewed SDOH needs and provided community resources to patients AVS.  Luise Pan, MSW, LCSWA Transitions of Care [phone]

## 2024-02-14 NOTE — Plan of Care (Signed)
  Problem: Fluid Volume: Goal: Hemodynamic stability will improve Outcome: Progressing   Problem: Clinical Measurements: Goal: Diagnostic test results will improve Outcome: Progressing Goal: Signs and symptoms of infection will decrease Outcome: Progressing   Problem: Respiratory: Goal: Ability to maintain adequate ventilation will improve Outcome: Progressing   Problem: Education: Goal: Ability to describe self-care measures that may prevent or decrease complications (Diabetes Survival Skills Education) will improve Outcome: Progressing Goal: Individualized Educational Video(s) Outcome: Progressing   Problem: Coping: Goal: Ability to adjust to condition or change in health will improve Outcome: Progressing   Problem: Fluid Volume: Goal: Ability to maintain a balanced intake and output will improve Outcome: Progressing   Problem: Health Behavior/Discharge Planning: Goal: Ability to identify and utilize available resources and services will improve Outcome: Progressing Goal: Ability to manage health-related needs will improve Outcome: Progressing   Problem: Metabolic: Goal: Ability to maintain appropriate glucose levels will improve Outcome: Progressing   Problem: Nutritional: Goal: Maintenance of adequate nutrition will improve Outcome: Progressing Goal: Progress toward achieving an optimal weight will improve Outcome: Progressing   Problem: Skin Integrity: Goal: Risk for impaired skin integrity will decrease Outcome: Progressing   Problem: Tissue Perfusion: Goal: Adequacy of tissue perfusion will improve Outcome: Progressing   Problem: Education: Goal: Knowledge of General Education information will improve Description: Including pain rating scale, medication(s)/side effects and non-pharmacologic comfort measures Outcome: Progressing   Problem: Health Behavior/Discharge Planning: Goal: Ability to manage health-related needs will improve Outcome:  Progressing   Problem: Clinical Measurements: Goal: Ability to maintain clinical measurements within normal limits will improve Outcome: Progressing Goal: Will remain free from infection Outcome: Progressing Goal: Diagnostic test results will improve Outcome: Progressing Goal: Respiratory complications will improve Outcome: Progressing Goal: Cardiovascular complication will be avoided Outcome: Progressing   Problem: Activity: Goal: Risk for activity intolerance will decrease Outcome: Progressing   Problem: Nutrition: Goal: Adequate nutrition will be maintained Outcome: Progressing   Problem: Coping: Goal: Level of anxiety will decrease Outcome: Progressing   Problem: Elimination: Goal: Will not experience complications related to bowel motility Outcome: Progressing Goal: Will not experience complications related to urinary retention Outcome: Progressing   Problem: Pain Managment: Goal: General experience of comfort will improve and/or be controlled Outcome: Progressing   Problem: Safety: Goal: Ability to remain free from injury will improve Outcome: Progressing   Problem: Skin Integrity: Goal: Risk for impaired skin integrity will decrease Outcome: Progressing

## 2024-02-14 NOTE — Discharge Instructions (Signed)

## 2024-02-14 NOTE — Care Management Important Message (Signed)
 Important Message  Patient Details  Name: MONTRAIL MEHRER MRN: [id number] Date of Birth: 06/25/1960   Important Message Given:  Yes - Medicare IM     [name] 02/14/2024, 12:13 PM

## 2024-02-14 NOTE — Progress Notes (Addendum)
 " Progress Note   Patient: Kirk [name] FMW:[id number] DOB: February 14, 1960 DOA: 02/10/2024     4 DOS: the patient was seen and examined on 02/14/2024   Brief hospital course: Kirk [name] was admitted to the hospital with the working diagnosis of COPD exacerbation.   64 y.o. male with medical history significant of hypertension, hyperlipidemia, diabetes, paroxysmal atrial fibrillation, CAD, heart failure,  PTSD, depression, ESRD on HD, COPD/asthma, OSA, OHS, obesity, seizures, subdural hematoma, pancytopenia who presented with dyspnea and cough.  Reported about 10 days of worsening cough and dyspnea. On the day of hospitalization he was noted to be hypoxemic 89% on room and having fever 102.4 on hemodialysis, he had a partial HD treatment and was referred to the ED.  On his initial physical examination his blood pressure was 128/76, HR 82, RR 20 and 02 saturation 98% on supplemental 02, temperature 101.8  Lungs with with rhonchi and rales, positive wheezing, heart with S1 and S2 present and regular, abdomen with no distention and no lower extremity edema    Na 138, K 3.5 Cl 93 bicarbonate 32 glucose 114 BUN 17 cr 5.15  Mg 1,9  BNP >35,000 Lactic acid 0,9 and 0,5  Procalcitonin 1.14  Wbc 2,8 hgb 9,0 plt 71   Influenza negative RSV negative SARS covid 19 negative   Positive for metapneumovirus.  Chest radiograph with cardiomegaly and bilateral hilar vascular congestion, bilateral lower lobes interstitial infiltrates.  Ct chest lower lobes bilateral emphysematous changes with sub pleural fibrosis and traction bronchiectasis.  Increased peri bronchial nodularity right middle lobe.  No CT evidence of acute intra abdominal or pelvic abnormality.   EKG 77 bpm, normal axis, normal intervals, qtc 494 sinus rhythm with no significant ST segment or T wave changes.   01/21 placed on bronchodilators and steroids for COPD exacerbation  HD with good toleration. 01/22 clinically improving.    Assessment and Plan: * COPD with acute exacerbation (HCC) Patient with chronic changes, at the lower zones, ruled out pneumonia.  Positive meta pneumovirus   Continue with systemic and inhaled corticosteroids Reduced systemic steroids to 40 mg once daily methylprednisolone   Bronchodilator therapy Airway clearing techniques with flutter valve and incentive spirometry Antitussive  agents, change to scheduled Oxycodone  for pain control   Acute on chronic systolic CHF (congestive heart failure) (HCC) Echocardiogram with reduced LV systolic function with EF 25 to 30%, global hypokinesis, dilatated LV cavity, fusion of EA, RV systolic function low normal, LA with mild dilatation, moderate aortic stenosis   Systolic blood pressure 150 mmHg   Continue afterload reduction with hydralazine  and add isosorbide   Continue metoprolol  Renal replacement therapy with ultrafiltration   Essential hypertension Continue blood pressure control with hydralazine  and isosorbide   Continue metoprolol .   CAD (coronary artery disease) No chest pain, no acute coronary syndrome   PAF (paroxysmal atrial fibrillation) (HCC) Continue metoprolol  for rate control, patient is not on DOAC. Continue aspirin    ESRD on dialysis (HCC) Hyponatremia.   Pre HD BUN is 47, with K at 4,2 P 3,9   Anemia of chronic renal disease.  Leukopenia, and thrombocytopenia, possible due to acute viral illness.  Count with improving wbc and plt   Metabolic bone disease, resumed on P binders.   Type 2 diabetes mellitus with diabetic nephropathy, without long-term current use of insulin  (HCC) Hyperglycemia  Continue glucose cover and monitoring with insulin  sliding scale   OSA on CPAP Continue Cpap       Subjective: Patient with improvement in cough and  dyspnea, not yet back to baseline   Physical Exam: Vitals:   02/14/24 1149 02/14/24 1200 02/14/24 1222 02/14/24 1223  BP: (!) 125/50 (!) 116/45 (!) 111/53 (!) 117/55   Pulse: 78 72 79 75  Resp: (!) 21 20 (!) 24 (!) 24  Temp:    98.6 F (37 C)  TempSrc:    Oral  SpO2: 91% 92% 93% 92%  Weight:      Height:       Neurology awake and alert ENT with mild pallor Cardiovascular with S1 and S2 present and regular with no gallops, rubs or murmurs Respiratory with bilateral rhonchi and scattered rales with no wheezing Abdomen with no distention, soft and non tender No lower extremity edema   Data Reviewed:    Family Communication: no family at the bedside   Disposition: Status is: Inpatient Remains inpatient appropriate because: possible discharge tomorrow, recovering COPD exacerbation   Planned Discharge Destination: Home     Author: Elidia Toribio Furnace, MD 02/14/2024 12:35 PM  For on call review [email or web address].  "

## 2024-02-14 NOTE — Progress Notes (Signed)
" °   02/14/24 1223  Vitals  Temp 98.6 F (37 C)  Temp Source Oral  BP (!) 117/55  MAP (mmHg) 73  BP Location Right Arm  BP Method Automatic  Patient Position (if appropriate) Lying  Pulse Rate 75  Pulse Rate Source Monitor  ECG Heart Rate 75  Resp (!) 24  Oxygen  Therapy  SpO2 92 %  O2 Device Nasal Cannula  Patient Activity (if Appropriate) In bed  During Treatment Monitoring  Blood Flow Rate (mL/min) 199 mL/min  Arterial Pressure (mmHg) -66.05 mmHg  Venous Pressure (mmHg) 94.54 mmHg  TMP (mmHg) 32.72 mmHg  Ultrafiltration Rate (mL/min) 1153 mL/min  Dialysate Flow Rate (mL/min) 299 ml/min  Dialysate Potassium Concentration 3  Dialysate Calcium  Concentration 2.5  Duration of HD Treatment -hour(s) 3.5 hour(s)  Cumulative Fluid Removed (mL) per Treatment  2600.14  HD Safety Checks Performed Yes  Post Treatment  Dialyzer Clearance Lightly streaked  Liters Processed 82.2  Fluid Removed (mL) 2600 mL  Tolerated HD Treatment Yes  AVG/AVF Arterial Site Held (minutes) 8 minutes  AVG/AVF Venous Site Held (minutes) 8 minutes  Fistula / Graft Left Forearm Arteriovenous fistula  No placement date or time found.   Placed prior to admission: Yes  Orientation: Left  Access Location: Forearm  Access Type: Arteriovenous fistula  Site Condition No complications  Fistula / Graft Assessment Present;Thrill;Bruit  Status Deaccessed  Drainage Description None    "

## 2024-02-14 NOTE — Progress Notes (Addendum)
 " [facility] Progress Note   Subjective:   Seen on HD. 3.5L UFG and tolerating. Dyspnea improving. No CP or abd pain.  Objective Vitals:   02/14/24 0841 02/14/24 0900 02/14/24 0930 02/14/24 1000  BP: (!) 165/59 (!) 163/71 (!) 174/66 (!) 169/69  Pulse: 80 80 76 81  Resp:   12 12  Temp:      TempSrc:      SpO2: 90% 91% 93% 92%  Weight:      Height:       Physical Exam General: Well appearing, NAD. [place] O2 in place Heart: RRR Lungs: Rhonchi throughout Abdomen: soft Extremities: no LE edema Dialysis Access: AVF +t/b  Additional Objective Labs: Basic Metabolic Panel: Recent Labs  Lab 02/12/24 0302 02/13/24 0215 02/14/24 0303  NA 134* 137 135  K 4.2 4.2 4.2  CL 90* 95* 92*  CO2 26 28 25   GLUCOSE 102* 183* 195*  BUN 42* 29* 47*  CREATININE 9.60* 6.88* 9.51*  CALCIUM  8.9 10.3 10.3  PHOS  --  3.6 6.0*   Liver Function Tests: Recent Labs  Lab 02/10/24 1523 02/11/24 0401 02/13/24 0215 02/14/24 0303  AST 18 17  --   --   ALT 10 9  --   --   ALKPHOS 73 67  --   --   BILITOT 0.4 0.3  --   --   PROT 7.2 6.9  --   --   ALBUMIN  4.1 3.9 3.9 3.9   CBC: Recent Labs  Lab 02/10/24 1523 02/11/24 0401 02/12/24 0302 02/13/24 0215 02/14/24 0303  WBC 2.6* 2.2* 1.7* 2.0* 2.5*  NEUTROABS 2.1  --  0.8*  --  2.0  HGB 9.0* 8.8* 8.7* 9.2* 9.1*  HCT 27.4* 26.9* 26.5* 27.6* 27.4*  MCV 99.6 101.9* 100.4* 98.6 98.6  PLT 71* 63* 61* 80* 87*   Medications:   aspirin  EC  81 mg Oral q1800   budesonide  (PULMICORT ) nebulizer solution  0.25 mg Nebulization BID   Chlorhexidine  Gluconate Cloth  6 each Topical Q0600   doxazosin   4 mg Oral Daily   guaiFENesin -dextromethorphan   5 mL Oral Q6H   heparin   5,000 Units Subcutaneous Q8H   hydrALAZINE   100 mg Oral BID   insulin  aspart  0-6 Units Subcutaneous TID WC   ipratropium-albuterol   3 mL Nebulization BID   isosorbide  mononitrate  15 mg Oral Daily   methylPREDNISolone  (SOLU-MEDROL ) injection  40 mg Intravenous Q12H    metoprolol  succinate  25 mg Oral Daily   multivitamin  1 tablet Oral Daily   sevelamer  carbonate  4.8 g Oral TID WC   sodium chloride  flush  3 mL Intravenous Q12H    Dialysis Orders [facility] MWF  4h B400  80.7kg   AVF Heparin  2500  Hep B SAg neg on 01/08/24   Assessment/Plan:  # ESRD - on HD MWF, has not missed - HD now, 3.5L UFG    # HTN/volume - BP high today, last CXR with pulm edema - UF as tolerated, still up per weights. Continue home meds   # Anemia of ESRD - Hgb 9.1 - stable  # Secondary HPTH - Ca/Phos both a little high, continue sevelamer , no VDRA here   # AHRF + metapneumovirus infection + pulm edema - Off abx at this time - On inhalers/steroids and still requiring O2  Izetta Boehringer, PA-C 02/14/2024, 10:23 AM  [facility]  addendum 11:32am: Developed sharp central CP. UF off without improvement. NTG given, eased pain  but caused hypotension. IVF bolus given. Feels ok for now - leaving UF off for now. [place]    "

## 2024-02-15 ENCOUNTER — Other Ambulatory Visit (HOSPITAL_COMMUNITY): Payer: Self-pay

## 2024-02-15 DIAGNOSIS — I2510 Atherosclerotic heart disease of native coronary artery without angina pectoris: Secondary | ICD-10-CM | POA: Diagnosis not present

## 2024-02-15 DIAGNOSIS — I5023 Acute on chronic systolic (congestive) heart failure: Secondary | ICD-10-CM | POA: Diagnosis not present

## 2024-02-15 DIAGNOSIS — E1121 Type 2 diabetes mellitus with diabetic nephropathy: Secondary | ICD-10-CM | POA: Diagnosis not present

## 2024-02-15 DIAGNOSIS — J441 Chronic obstructive pulmonary disease with (acute) exacerbation: Secondary | ICD-10-CM | POA: Diagnosis not present

## 2024-02-15 DIAGNOSIS — Z992 Dependence on renal dialysis: Secondary | ICD-10-CM | POA: Diagnosis not present

## 2024-02-15 DIAGNOSIS — I10 Essential (primary) hypertension: Secondary | ICD-10-CM | POA: Diagnosis not present

## 2024-02-15 DIAGNOSIS — N186 End stage renal disease: Secondary | ICD-10-CM | POA: Diagnosis not present

## 2024-02-15 DIAGNOSIS — I480 Paroxysmal atrial fibrillation: Secondary | ICD-10-CM | POA: Diagnosis not present

## 2024-02-15 DIAGNOSIS — G4733 Obstructive sleep apnea (adult) (pediatric): Secondary | ICD-10-CM | POA: Diagnosis not present

## 2024-02-15 LAB — CULTURE, BLOOD (ROUTINE X 2)
Culture: NO GROWTH
Culture: NO GROWTH
Special Requests: ADEQUATE
Special Requests: ADEQUATE

## 2024-02-15 LAB — CBC
HCT: 31.4 % — ABNORMAL LOW (ref 39.0–52.0)
Hemoglobin: 10.5 g/dL — ABNORMAL LOW (ref 13.0–17.0)
MCH: 33.3 pg (ref 26.0–34.0)
MCHC: 33.4 g/dL (ref 30.0–36.0)
MCV: 99.7 fL (ref 80.0–100.0)
Platelets: 93 10*3/uL — ABNORMAL LOW (ref 150–400)
RBC: 3.15 MIL/uL — ABNORMAL LOW (ref 4.22–5.81)
RDW: 14.6 % (ref 11.5–15.5)
WBC: 3.9 10*3/uL — ABNORMAL LOW (ref 4.0–10.5)
nRBC: 0.5 % — ABNORMAL HIGH (ref 0.0–0.2)

## 2024-02-15 LAB — GLUCOSE, CAPILLARY
Glucose-Capillary: 114 mg/dL — ABNORMAL HIGH (ref 70–99)
Glucose-Capillary: 143 mg/dL — ABNORMAL HIGH (ref 70–99)

## 2024-02-15 MED ORDER — PREDNISONE 20 MG PO TABS
20.0000 mg | ORAL_TABLET | Freq: Every day | ORAL | 0 refills | Status: AC
  Filled 2024-02-15: qty 3, 3d supply, fill #0

## 2024-02-15 MED ORDER — IPRATROPIUM-ALBUTEROL 0.5-2.5 (3) MG/3ML IN SOLN
3.0000 mL | Freq: Four times a day (QID) | RESPIRATORY_TRACT | 0 refills | Status: AC | PRN
  Filled 2024-02-15: qty 90, 8d supply, fill #0

## 2024-02-15 MED ORDER — METOPROLOL SUCCINATE ER 25 MG PO TB24
25.0000 mg | ORAL_TABLET | Freq: Every day | ORAL | 0 refills | Status: AC
  Filled 2024-02-15: qty 30, 30d supply, fill #0

## 2024-02-15 MED ORDER — GUAIFENESIN-DM 100-10 MG/5ML PO SYRP
5.0000 mL | ORAL_SOLUTION | Freq: Four times a day (QID) | ORAL | 0 refills | Status: AC | PRN
  Filled 2024-02-15: qty 118, 6d supply, fill #0

## 2024-02-15 MED ORDER — ISOSORBIDE MONONITRATE ER 30 MG PO TB24
30.0000 mg | ORAL_TABLET | Freq: Every day | ORAL | 0 refills | Status: AC
  Filled 2024-02-15: qty 30, 30d supply, fill #0

## 2024-02-15 MED ORDER — PREDNISONE 20 MG PO TABS: 20.0000 mg | ORAL_TABLET | Freq: Every day | ORAL | Status: DC

## 2024-02-15 MED ORDER — HYDRALAZINE HCL 100 MG PO TABS
100.0000 mg | ORAL_TABLET | Freq: Two times a day (BID) | ORAL | 0 refills | Status: AC
  Filled 2024-02-15: qty 60, 30d supply, fill #0

## 2024-02-15 NOTE — Progress Notes (Signed)
 " [facility] Progress Note   Subjective:   Seen in room. Says breathing is improving. He is hoping to be discharged today. reminded watch fluids/K and monitor news for possible HD delay on Monday.  Objective Vitals:   02/15/24 0417 02/15/24 0500 02/15/24 0812 02/15/24 0841  BP: 129/61  135/63   Pulse: 72  78 78  Resp: 20  18 19   Temp: 98.6 F (37 C)  97.8 F (36.6 C)   TempSrc: Oral     SpO2: 97%  94% 95%  Weight:  76.3 kg    Height:       Physical Exam General: Well appearing, NAD. [place] O2 in place Heart: RRR Lungs: Coarse air movement, but much improved from last exam Abdomen: soft Extremities: no LE edema Dialysis Access: AVF +t/b  Additional Objective Labs: Basic Metabolic Panel: Recent Labs  Lab 02/12/24 0302 02/13/24 0215 02/14/24 0303  NA 134* 137 135  K 4.2 4.2 4.2  CL 90* 95* 92*  CO2 26 28 25   GLUCOSE 102* 183* 195*  BUN 42* 29* 47*  CREATININE 9.60* 6.88* 9.51*  CALCIUM  8.9 10.3 10.3  PHOS  --  3.6 6.0*   Liver Function Tests: Recent Labs  Lab 02/10/24 1523 02/11/24 0401 02/13/24 0215 02/14/24 0303  AST 18 17  --   --   ALT 10 9  --   --   ALKPHOS 73 67  --   --   BILITOT 0.4 0.3  --   --   PROT 7.2 6.9  --   --   ALBUMIN  4.1 3.9 3.9 3.9   CBC: Recent Labs  Lab 02/10/24 1523 02/11/24 0401 02/12/24 0302 02/13/24 0215 02/14/24 0303 02/15/24 0635  WBC 2.6* 2.2* 1.7* 2.0* 2.5* 3.9*  NEUTROABS 2.1  --  0.8*  --  2.0  --   HGB 9.0* 8.8* 8.7* 9.2* 9.1* 10.5*  HCT 27.4* 26.9* 26.5* 27.6* 27.4* 31.4*  MCV 99.6 101.9* 100.4* 98.6 98.6 99.7  PLT 71* 63* 61* 80* 87* 93*   Blood Culture    Component Value Date/Time   SDES BLOOD RIGHT ANTECUBITAL 02/10/2024 1523   SPECREQUEST  02/10/2024 1523    BOTTLES DRAWN AEROBIC AND ANAEROBIC Blood Culture adequate volume   CULT  02/10/2024 1523    NO GROWTH 4 DAYS Performed at [facility], [address], [place], [place] [address]    REPTSTATUS PENDING 02/10/2024 1523    Medications:   aspirin  EC  81 mg Oral q1800   budesonide  (PULMICORT ) nebulizer solution  0.25 mg Nebulization BID   doxazosin   4 mg Oral Daily   guaiFENesin -dextromethorphan   5 mL Oral Q6H   heparin   5,000 Units Subcutaneous Q8H   hydrALAZINE   100 mg Oral BID   insulin  aspart  0-6 Units Subcutaneous TID WC   ipratropium-albuterol   3 mL Nebulization BID   isosorbide  mononitrate  30 mg Oral Daily   methylPREDNISolone  (SOLU-MEDROL ) injection  40 mg Intravenous Daily   metoprolol  succinate  25 mg Oral Daily   multivitamin  1 tablet Oral Daily   sevelamer  carbonate  4.8 g Oral TID WC   sodium chloride  flush  3 mL Intravenous Q12H    Dialysis Orders [facility] MWF  4h B400  80.7kg   AVF Heparin  2500  Hep B SAg neg on 01/08/24   Assessment/Plan:   # ESRD - on HD MWF, has not missed - Next HD 1/26, ok to discharge from renal standpoint   #  HTN/volume - BP better - Now below prior EDW, will lower on discharge   # Anemia of ESRD - Hgb 10.5, better   # Secondary HPTH - Ca/Phos both a little high, continue sevelamer , no VDRA here   # AHRF + metapneumovirus infection + pulm edema - Off abx at this time - On inhalers/steroids and still requiring O2 (has at home)   Izetta Boehringer, PA-C 02/14/2024, 10:23 AM  [facility], [place] 02/15/2024, 9:20 AM  [facility]    "

## 2024-02-15 NOTE — Discharge Planning (Signed)
 [facility] Patient Discharge Orders - [facility] CLINIC: [facility]  Patient's name: Kirk [name] Admit/DC Dates: 02/10/2024 - 02/15/24  DISCHARGE DIAGNOSES: Acute Hypoxic Resp Failure (pulm edema + #2) Metapneumovirus  Pulm edema   HD ORDER CHANGES: Heparin  change: no EDW Change: yes New EDW: 76.5kg [place] Change: no  ANEMIA MANAGEMENT: Aranesp : Given: no ESA dose for discharge: Mircera per protocol IV Iron dose at discharge: Per protocol Transfusion: Given: no  BONE/MINERAL MEDICATIONS: Hectorol /Calcitriol  change: no Sensipar /Parsabiv change: no  ACCESS INTERVENTION/CHANGE: no Details:   RECENT LABS: Recent Labs  Lab 02/14/24 0303 02/15/24 0635  HGB 9.1* 10.5*  NA 135  --   K 4.2  --   CALCIUM  10.3  --   PHOS 6.0*  --   ALBUMIN  3.9  --     IV ANTIBIOTICS: no Details:  OTHER ANTICOAGULATION: no Details:  OTHER/APPTS/LAB ORDERS: - Check Ca, Alb, PTH next week. Ca slightly high here - may need to adjust VDRA  D/C Meds to be reconciled by nurse after every discharge.  Completed By: Izetta Boehringer, PA-C [facility] Pager [phone]   Reviewed by: MD:______ RN_______

## 2024-02-15 NOTE — Discharge Summary (Signed)
 " Physician Discharge Summary   Patient: Kirk [name] MRN: [id number] DOB: 05-Mar-1960  Admit date:     02/10/2024  Discharge date: 02/15/24  Discharge Physician: Elidia Sieving Perrion Diesel   PCP: Leonarda Roxan [name], NP   Recommendations at discharge:    Patient will continue as needed bronchodilator therapy. Systemic steroids with prednisone  for 3 more days.  To consider outpatient pulmonary function testing and evaluation for possible maintenance inhalers.  Continue airway clearing techniques at home with incentive spirometer and flutter valve.  Added isosorbide  for afterload reduction,refilled hydralazine  and metoprolol .  Follow up with Dinah Ngetich NP in 7 to 10 days Follow up with Nephrology as scheduled   Discharge Diagnoses: Principal Problem:   COPD with acute exacerbation (HCC) Active Problems:   Acute on chronic systolic CHF (congestive heart failure) (HCC)   Essential hypertension   CAD (coronary artery disease)   PAF (paroxysmal atrial fibrillation) (HCC)   ESRD on dialysis (HCC)   Type 2 diabetes mellitus with diabetic nephropathy, without long-term current use of insulin  (HCC)   OSA on CPAP  Resolved Problems:   * No resolved hospital problems. [facility] Course: Kirk [name] was admitted to the hospital with the working diagnosis of COPD exacerbation.   64 y.o. male with medical history significant of hypertension, hyperlipidemia, type 2 diabetes mellitus, paroxysmal atrial fibrillation, CAD, heart failure,  PTSD, depression, ESRD on HD, COPD/asthma, OSA, OHS, seizures, and history of subdural hematoma who presented with dyspnea and cough.  Reported about 10 days of worsening cough and dyspnea. On the day of hospitalization he was noted to be hypoxemic 89% on room and having fever 102.4 on hemodialysis, he had a partial HD treatment and was referred to the ED.  On his initial physical examination his blood pressure was 128/76, HR 82, RR 20 and 02 saturation 98% on  supplemental 02, temperature 101.8  Lungs with with rhonchi and rales, positive wheezing, heart with S1 and S2 present and regular, abdomen with no distention and no lower extremity edema    Na 138, K 3.5 Cl 93 bicarbonate 32 glucose 114 BUN 17 cr 5.15  Mg 1,9  BNP >35,000 Lactic acid 0,9 and 0,5  Procalcitonin 1.14  Wbc 2,8 hgb 9,0 plt 71   Influenza negative RSV negative SARS covid 19 negative   Positive for metapneumovirus.  Chest radiograph with cardiomegaly and bilateral hilar vascular congestion, bilateral lower lobes interstitial infiltrates.  Ct chest lower lobes bilateral emphysematous changes with sub pleural fibrosis and traction bronchiectasis.  Increased peri bronchial nodularity right middle lobe.  No CT evidence of acute intra abdominal or pelvic abnormality.   EKG 77 bpm, normal axis, normal intervals, qtc 494 sinus rhythm with no significant ST segment or T wave changes.   01/21 placed on bronchodilators and steroids for COPD exacerbation  HD with good toleration. 01/22 clinically improving.  01/23 patient will continue bronchodilator and steroids at home, follow up with primary care as outpatient.   Assessment and Plan: * COPD with acute exacerbation (HCC) Patient with chronic changes, at the lower zones, ruled out pneumonia.  Positive meta pneumovirus   Patient was placed on systemic and inhaled corticosteroids Bronchodilator therapy Airway clearing techniques with flutter valve and incentive spirometry Antitussive  agents.   His symptoms have improved, patient will continue steroids with oral prednisone  for 3 more days Continue bronchodilator therapy and airway clearing techniques. Continue home 02 supplementation per his usual regimen   Acute on chronic systolic CHF (congestive heart failure) (  HCC) Echocardiogram with reduced LV systolic function with EF 25 to 30%, global hypokinesis, dilatated LV cavity, fusion of EA, RV systolic function low normal, LA  with mild dilatation, moderate aortic stenosis   Systolic blood pressure 150 mmHg   Continue afterload reduction with hydralazine  and isosorbide   Continue metoprolol  Renal replacement therapy with ultrafiltration   Essential hypertension Continue blood pressure control with hydralazine  and isosorbide   Continue metoprolol .   CAD (coronary artery disease) No chest pain, no acute coronary syndrome   PAF (paroxysmal atrial fibrillation) (HCC) Continue metoprolol  for rate control, patient is not on DOAC. Continue aspirin    ESRD on dialysis (HCC) Hyponatremia.   01/23 Pre HD BUN is 47, with K at 4,2 P 3,9   Anemia of chronic renal disease.  Leukopenia, and thrombocytopenia, possible due to acute viral illness.  At the time of discharge cell count improving with wbc 3,9, and plt at 93, with hgb at 10.5   Metabolic bone disease, continue with P binders.  Continue outpatient HD   Type 2 diabetes mellitus with diabetic nephropathy, without long-term current use of insulin  (HCC) Hyperglycemia  Patient was placed on insulin  sliding scale for  glucose cover and monitoring during his hospitalization  At the time of discharge his capillary glucose was 114 and 143 mg/dl   OSA on CPAP Continue Cpap       Consultants: nephrology for HD  Procedures performed: none   Disposition: Home Diet recommendation:  Cardiac and Carb modified diet DISCHARGE MEDICATION: Allergies as of 02/15/2024       Reactions   Codeine Nausea And Vomiting   Lortab [hydrocodone -acetaminophen ] Nausea And Vomiting        Medication List     STOP taking these medications    albuterol  (2.5 MG/3ML) 0.083% nebulizer solution Commonly known as: PROVENTIL        TAKE these medications    acetaminophen  500 MG tablet Commonly known as: TYLENOL  Take 1,000 mg by mouth 2 (two) times daily.   aspirin  EC 81 MG tablet Take 81 mg by mouth daily at 6 PM.   diphenhydrAMINE  25 MG tablet Commonly known as:  BENADRYL  Take 50-75 mg by mouth 2 (two) times daily.   doxazosin  4 MG tablet Commonly known as: CARDURA  Take 4 mg by mouth daily.   guaiFENesin -dextromethorphan  100-10 MG/5ML syrup Commonly known as: ROBITUSSIN DM Take 5 mLs by mouth every 6 (six) hours as needed for cough.   hydrALAZINE  100 MG tablet Commonly known as: APRESOLINE  Take 1 tablet (100 mg total) by mouth 2 (two) times daily.   ipratropium-albuterol  0.5-2.5 (3) MG/3ML Soln Commonly known as: DUONEB Take 3 mLs by nebulization every 6 (six) hours as needed (shortness of breath).   isosorbide  mononitrate 30 MG 24 hr tablet Commonly known as: IMDUR  Take 1 tablet (30 mg total) by mouth daily. Start taking on: February 16, 2024   metoprolol  succinate 25 MG 24 hr tablet Commonly known as: TOPROL -XL Take 1 tablet (25 mg total) by mouth daily. Start taking on: February 16, 2024   multivitamin Tabs tablet Take 1 tablet by mouth daily.   NYQUIL HBP COLD & FLU PO Take 30 mLs by mouth every 12 (twelve) hours as needed (cough).   oxyCODONE  5 MG immediate release tablet Commonly known as: Roxicodone  Take 1 tablet (5 mg total) by mouth every 4 (four) hours as needed for severe pain (pain score 7-10).   OXYGEN  Inhale 3 L/min into the lungs continuous.   predniSONE  20 MG tablet Commonly  known as: DELTASONE  Take 1 tablet (20 mg total) by mouth daily with breakfast for 3 days. Start taking on: February 16, 2024   sevelamer  carbonate 2.4 g Pack Commonly known as: RENVELA  Take 4.8 g by mouth 3 (three) times daily with meals. Also takes with his snacks   UNABLE TO FIND Inhale 1 Device into the lungs at bedtime. CPAP        Follow-up Information     Ngetich, Dinah C, NP Follow up on 02/19/2024.   Specialty: Family Medicine Why: 11:00 , please arrive 15 mins early, bring copayment Contact information: [address] [place] [address] [phone]                Discharge Exam: Fredricka Weights   02/14/24  0834 02/14/24 1326 02/15/24 0500  Weight: 88.6 kg 76.5 kg 76.3 kg   BP 135/63 (BP Location: Right Arm)   Pulse 78   Temp 97.8 F (36.6 C)   Resp 19   Ht 5' 7 (1.702 m)   Wt 76.3 kg   SpO2 95%   BMI 26.35 kg/m   Patient is feeling better, dyspnea has improved along with cough, no chest pain, no PND, orthopnea or lower extremity edema   Neurology awake and alert ENT with mild pallor with no icterus Cardiovascular with S1 and S2 present and regular with no gallops or rubs, positive systolic murmur at the apex No JVD Respiratory with no rales or rhonchi, no wheezing,  Abdomen with no distention, soft and non tender No lower extremity edema   Condition at discharge: stable  The results of significant diagnostics from this hospitalization (including imaging, microbiology, ancillary and laboratory) are listed below for reference.   Imaging Studies: CT CHEST ABDOMEN PELVIS W CONTRAST Result Date: 02/10/2024 CLINICAL DATA:  Shortness of breath EXAM: CT CHEST, ABDOMEN, AND PELVIS WITH CONTRAST TECHNIQUE: Multidetector CT imaging of the chest, abdomen and pelvis was performed following the standard protocol during bolus administration of intravenous contrast. RADIATION DOSE REDUCTION: This exam was performed according to the departmental dose-optimization program which includes automated exposure control, adjustment of the mA and/or kV according to patient size and/or use of iterative reconstruction technique. CONTRAST:  75mL OMNIPAQUE  IOHEXOL  350 MG/ML SOLN COMPARISON:  Chest x-ray 02/10/2024, chest CT 11/19/2023, 05/06/2020 FINDINGS: CT CHEST FINDINGS Cardiovascular: Moderate aortic atherosclerosis. No aneurysm. Multi vessel coronary vascular calcification. Cardiomegaly. Aortic valvular calcification. Mitral calcification. No pericardial effusion. Mediastinum/Nodes: Patent trachea. No suspicious thyroid  mass. Mildly enlarged mediastinal lymph nodes, precarinal node measures 14 mm. Subcarinal  lymph node measures 15 mm. Esophagus within normal limits. Lungs/Pleura: Limited assessment of the parenchyma due to motion degradation. Mild subpleural reticulation and peribronchovascular clustered nodularity some of which is chronic and was seen on exam from 2022. However, small focus of airspace consolidation within the right middle lobe and overall increased peribronchovascular nodularity and airspace disease, greatest within the right middle lobe and bilateral lower lobes suspicious for acute respiratory infection/pneumonia. No pleural effusion or pneumothorax Musculoskeletal: No acute or suspicious osseous abnormality. CT ABDOMEN PELVIS FINDINGS Hepatobiliary: No focal liver abnormality is seen. Status post cholecystectomy. No biliary dilatation. Pancreas: Unremarkable. No pancreatic ductal dilatation or surrounding inflammatory changes. Spleen: Upper normal in size to slightly enlarged at 13 cm Adrenals/Urinary Tract: Inter adrenal glands are normal. Atrophic native kidneys with numerous stones as well as cysts and subcentimeter hypodensities too small to further characterize, no specific imaging follow-up is recommended. 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 Bladder is decompressed Stomach/Bowel: Stomach within normal limits. No dilated small bowel. No acute bowel wall thickening. Diverticular disease of the left colon Vascular/Lymphatic: Aortic atherosclerosis. No enlarged abdominal or pelvic lymph nodes. Reproductive: Enlarged prostate Other: No ascites or free air. Redemonstrated 2.6 cm cystic collection within the subcutaneous soft tissues of the medial left gluteal fold without substantial surrounding inflammation. Musculoskeletal: No acute or suspicious osseous abnormality IMPRESSION: 1. Limited assessment of the lung parenchyma due to motion degradation. Mild subpleural reticulation and peribronchovascular clustered nodularity some of which  is chronic and was seen on exam from 2022. There is however, small focus of airspace consolidation within the right middle lobe and overall increased peribronchovascular nodularity and airspace disease, greatest within the right middle lobe and bilateral lower lobes suspicious for acute respiratory infection/pneumonia superimposed on underlying chronic change. 2. Mildly enlarged mediastinal lymph nodes, likely reactive. 3. No CT evidence for acute intra-abdominal or pelvic abnormality. 4. Atrophic native kidneys with numerous stones. Calcific mass anterior to the right psoas muscle suspected to represent failed renal transplant. 5. Stable 2.6 cm fluid density lesion within the subcutaneous soft tissues of the medial left gluteal cleft, without significant surrounding soft tissue inflammatory process, possibly a subcutaneous cysts, correlate with direct inspection. 6. Diverticular disease of the left colon without acute inflammatory process. 7. Aortic atherosclerosis. Aortic Atherosclerosis (ICD10-I70.0). Electronically Signed   By: Luke Bun M.D.   On: 02/10/2024 17:58   DG Chest 2 View if patient is not in a treatment room. Result Date: 02/10/2024 CLINICAL DATA:  Suspected sepsis shortness of breath and cough EXAM: DG CHEST 2V COMPARISON:  12/09/2023 FINDINGS: Hardware in the cervical spine. Cardiomegaly with central congestion and mild patchy mid to lower lung opacity. No pleural effusion or pneumothorax IMPRESSION: Cardiomegaly with central congestion. Mild patchy mid to lower lung opacity may be due to edema or infection. Electronically Signed   By: Luke Bun M.D.   On: 02/10/2024 16:09   CT Renal Stone Study Result Date: 01/30/2024 EXAM: CT ABDOMEN AND PELVIS WITHOUT CONTRAST 01/30/2024 12:46:44 AM TECHNIQUE: CT of the abdomen and pelvis was performed without the administration of intravenous contrast. Multiplanar reformatted images are provided for review. Automated exposure control, iterative  reconstruction, and/or weight-based adjustment of the mA/kV was utilized to reduce the radiation dose to as low as reasonably achievable. COMPARISON: Comparison with 01/09/2021. CLINICAL HISTORY: Abdominal/flank pain, stone suspected. Hematuria. FINDINGS: LOWER CHEST: Lung bases demonstrate emphysematous changes and scarring versus infiltration. Similar appearance to prior study. LIVER: The liver is normal. GALLBLADDER AND BILE DUCTS: The gallbladder is surgically absent. No biliary ductal dilatation. SPLEEN: The spleen is enlarged. PANCREAS: The pancreas is normal. ADRENAL GLANDS: No adrenal gland nodules. KIDNEYS, URETERS AND BLADDER: Bilateral renal atrophy with multiple subcentimeter cysts and multiple bilateral intrarenal stones. Stones measure up to 8 mm in diameter. Appearances are similar to the previous study. No hydronephrosis or hydroureter. The bladder is decompressed. No perinephric or periureteral stranding. Per consensus, no follow-up is needed for simple Bosniak type 1 and 2 renal cysts, unless the patient has a malignancy history or risk factors. GI AND BOWEL: The stomach, small bowel, and colon are not abnormally distended. No wall thickening or inflammatory stranding is noted. The appendix is normal. There is no bowel obstruction. PERITONEUM AND RETROPERITONEUM: No free air or free fluid is identified in the abdomen. VASCULATURE: Diffuse calcification of the aorta is present. No aneurysm is seen. Aorta is normal in caliber. LYMPH NODES: No lymphadenopathy.  REPRODUCTIVE ORGANS: The prostate gland is enlarged. BONES AND SOFT TISSUES: Degenerative changes in the spine. Left posterior perianal collection adjacent to the gluteal crease measuring 1.8 x 2.1 cm diameter. This is new since the prior study and may represent perianal abscess. Abdominal wall musculature appears intact. No acute osseous abnormality. IMPRESSION: 1. Bilateral renal atrophy with multiple subcentimeter cysts and multiple bilateral  intrarenal stones, measuring up to 8 mm in diameter, similar to the previous study, without hydronephrosis or hydroureter. 2. Left posterior perianal collection adjacent to the gluteal crease measuring 1.8 x 2.1 cm, new since the prior study, may represent a perianal abscess. 3. Enlarged prostate gland. Electronically signed by: Elsie Gravely MD 01/30/2024 12:57 AM EST RP Workstation: [id number]    Microbiology: Results for orders placed or performed during the hospital encounter of 02/10/24  Resp panel by RT-PCR (RSV, Flu A&B, Covid) Anterior Nasal Swab     Status: None   Collection Time: 02/10/24  3:13 PM   Specimen: Anterior Nasal Swab  Result Value Ref Range Status   SARS Coronavirus 2 by RT PCR NEGATIVE NEGATIVE Final   Influenza A by PCR NEGATIVE NEGATIVE Final   Influenza B by PCR NEGATIVE NEGATIVE Final    Comment: (NOTE) The Xpert Xpress SARS-CoV-2/FLU/RSV plus assay is intended as an aid in the diagnosis of influenza from Nasopharyngeal swab specimens and should not be used as a sole basis for treatment. Nasal washings and aspirates are unacceptable for Xpert Xpress SARS-CoV-2/FLU/RSV testing.  Fact Sheet for Patients: bloggercourse.com  Fact Sheet for Healthcare Providers: seriousbroker.it  This test is not yet approved or cleared by the [place]  FDA and has been authorized for detection and/or diagnosis of SARS-CoV-2 by FDA under an Emergency Use Authorization (EUA). This EUA will remain in effect (meaning this test can be used) for the duration of the COVID-19 declaration under Section 564(b)(1) of the Act, 21 U.S.C. section 360bbb-3(b)(1), unless the authorization is terminated or revoked.     Resp Syncytial Virus by PCR NEGATIVE NEGATIVE Final    Comment: (NOTE) Fact Sheet for Patients: bloggercourse.com  Fact Sheet for Healthcare  Providers: seriousbroker.it  This test is not yet approved or cleared by the [place]  FDA and has been authorized for detection and/or diagnosis of SARS-CoV-2 by FDA under an Emergency Use Authorization (EUA). This EUA will remain in effect (meaning this test can be used) for the duration of the COVID-19 declaration under Section 564(b)(1) of the Act, 21 U.S.C. section 360bbb-3(b)(1), unless the authorization is terminated or revoked.  Performed at [facility], [address], [place], [place] [address]   Culture, blood (Routine x 2)     Status: None   Collection Time: 02/10/24  3:15 PM   Specimen: BLOOD RIGHT FOREARM  Result Value Ref Range Status   Specimen Description BLOOD RIGHT FOREARM  Final   Special Requests   Final    BOTTLES DRAWN AEROBIC AND ANAEROBIC Blood Culture adequate volume   Culture   Final    NO GROWTH 5 DAYS Performed at [facility], [address], [place], [place] [address]    Report Status 02/15/2024 FINAL  Final  Culture, blood (Routine x 2)     Status: None   Collection Time: 02/10/24  3:23 PM   Specimen: BLOOD  Result Value Ref Range Status   Specimen Description BLOOD RIGHT ANTECUBITAL  Final   Special Requests   Final    BOTTLES DRAWN AEROBIC AND ANAEROBIC Blood Culture  adequate volume   Culture   Final    NO GROWTH 5 DAYS Performed at [facility], [address], [place], [place] [address]    Report Status 02/15/2024 FINAL  Final  Respiratory (~20 pathogens) panel by PCR     Status: Abnormal   Collection Time: 02/10/24  9:40 PM   Specimen: Nasopharyngeal Swab; Respiratory  Result Value Ref Range Status   Adenovirus NOT DETECTED NOT DETECTED Final   Coronavirus 229E NOT DETECTED NOT DETECTED Final    Comment: (NOTE) The Coronavirus on the Respiratory Panel, DOES NOT test for the novel  Coronavirus (2019 nCoV)    Coronavirus HKU1 NOT DETECTED NOT DETECTED Final   Coronavirus NL63 NOT  DETECTED NOT DETECTED Final   Coronavirus OC43 NOT DETECTED NOT DETECTED Final   Metapneumovirus DETECTED (A) NOT DETECTED Final   Rhinovirus / Enterovirus NOT DETECTED NOT DETECTED Final   Influenza A NOT DETECTED NOT DETECTED Final   Influenza B NOT DETECTED NOT DETECTED Final   Parainfluenza Virus 1 NOT DETECTED NOT DETECTED Final   Parainfluenza Virus 2 NOT DETECTED NOT DETECTED Final   Parainfluenza Virus 3 NOT DETECTED NOT DETECTED Final   Parainfluenza Virus 4 NOT DETECTED NOT DETECTED Final   Respiratory Syncytial Virus NOT DETECTED NOT DETECTED Final   Bordetella pertussis NOT DETECTED NOT DETECTED Final   Bordetella Parapertussis NOT DETECTED NOT DETECTED Final   Chlamydophila pneumoniae NOT DETECTED NOT DETECTED Final   Mycoplasma pneumoniae NOT DETECTED NOT DETECTED Final    Comment: Performed at [facility], [address], [place], [place] [address]   *Note: Due to a large number of results and/or encounters for the requested time period, some results have not been displayed. A complete set of results can be found in Results Review.    Labs: CBC: Recent Labs  Lab 02/10/24 1523 02/11/24 0401 02/12/24 0302 02/13/24 0215 02/14/24 0303 02/15/24 0635  WBC 2.6* 2.2* 1.7* 2.0* 2.5* 3.9*  NEUTROABS 2.1  --  0.8*  --  2.0  --   HGB 9.0* 8.8* 8.7* 9.2* 9.1* 10.5*  HCT 27.4* 26.9* 26.5* 27.6* 27.4* 31.4*  MCV 99.6 101.9* 100.4* 98.6 98.6 99.7  PLT 71* 63* 61* 80* 87* 93*   Basic Metabolic Panel: Recent Labs  Lab 02/10/24 1523 02/11/24 0401 02/12/24 0302 02/13/24 0215 02/14/24 0303  NA 138 133* 134* 137 135  K 3.5 4.1 4.2 4.2 4.2  CL 93* 90* 90* 95* 92*  CO2 32 27 26 28 25   GLUCOSE 114* 118* 102* 183* 195*  BUN 17 26* 42* 29* 47*  CREATININE 5.15* 6.97* 9.60* 6.88* 9.51*  CALCIUM  8.7* 8.4* 8.9 10.3 10.3  MG 1.9  --   --   --   --   PHOS  --   --   --  3.6 6.0*   Liver Function Tests: Recent Labs  Lab 02/10/24 1523 02/11/24 0401 02/13/24 0215  02/14/24 0303  AST 18 17  --   --   ALT 10 9  --   --   ALKPHOS 73 67  --   --   BILITOT 0.4 0.3  --   --   PROT 7.2 6.9  --   --   ALBUMIN  4.1 3.9 3.9 3.9   CBG: Recent Labs  Lab 02/14/24 0735 02/14/24 1706 02/14/24 2032 02/15/24 0810 02/15/24 1124  GLUCAP 122* 138* 132* 114* 143*    Discharge time spent: greater than 30 minutes.  Signed: Elidia Sieving  Aranza Geddes, MD [facility] 02/15/2024 "

## 2024-02-17 ENCOUNTER — Telehealth: Payer: Self-pay

## 2024-02-17 NOTE — Progress Notes (Addendum)
 Late note entry 1/26 1005am  D/c over the weekend noted. Contacted out-pt HD clinic [facility] and informed of pt d/c and anticipated arrival back to clinic today. No further support is needed.     Lavanda Shamia Uppal Dialysis Navigator [phone]

## 2024-02-17 NOTE — Transitions of Care (Post Inpatient/ED Visit) (Signed)
" ° °  02/17/2024  Name: Kirk [name] MRN: [id number] DOB: September 18, 1960  Today's TOC FU Call Status: Today's TOC FU Call Status:: Unsuccessful Call (1st Attempt) Unsuccessful Call (1st Attempt) Date: 02/17/24  Attempted to reach the patient regarding the most recent Inpatient/ED visit.  Follow Up Plan: Additional outreach attempts will be made to reach the patient to complete the Transitions of Care (Post Inpatient/ED visit) call.   Shona Prow RN, CCM [place]  VBCI-Population Health RN Care Manager [phone]  "

## 2024-02-18 ENCOUNTER — Telehealth: Payer: Self-pay

## 2024-02-18 NOTE — Transitions of Care (Post Inpatient/ED Visit) (Signed)
" ° °  02/18/2024  Name: Kirk [name] MRN: [id number] DOB: 10/09/1960  Today's TOC FU Call Status: Today's TOC FU Call Status:: Unsuccessful Call (2nd Attempt) Unsuccessful Call (2nd Attempt) Date: 02/18/24  Attempted to reach the patient regarding the most recent Inpatient/ED visit.  Follow Up Plan: Additional outreach attempts will be made to reach the patient to complete the Transitions of Care (Post Inpatient/ED visit) call.   Shona Prow RN, CCM [place]  VBCI-Population Health RN Care Manager [phone]  "

## 2024-02-19 ENCOUNTER — Telehealth: Payer: Self-pay

## 2024-02-19 ENCOUNTER — Inpatient Hospital Stay: Admitting: Family

## 2024-02-19 NOTE — Transitions of Care (Post Inpatient/ED Visit) (Signed)
" ° °  02/19/2024  Name: Kirk [name] MRN: [id number] DOB: 02-Jul-1960  Today's TOC FU Call Status: Today's TOC FU Call Status:: Unsuccessful Call (3rd Attempt) Unsuccessful Call (3rd Attempt) Date: 02/19/24  Attempted to reach the patient regarding the most recent Inpatient/ED visit.  Follow Up Plan: No further outreach attempts will be made at this time. We have been unable to contact the patient.  Shona Prow RN, CCM [place]  VBCI-Population Health RN Care Manager [phone]  "

## 2024-02-24 ENCOUNTER — Telehealth: Payer: Self-pay

## 2024-02-24 NOTE — Telephone Encounter (Signed)
 I attempted to call the patient two more times today and was not able to leave a message. I will try to call again tomorrow to schedule his appointment with Dr. Wendel.

## 2024-02-24 NOTE — Telephone Encounter (Signed)
 I attempted to call the patient with the only number on file to schedule an appointment with Dr. Wendel. I was unable to leave a message because the voicemail box was full. I will try to call again later today.
